# Patient Record
Sex: Female | Born: 1960 | Race: Black or African American | Hispanic: No | Smoking: Current every day smoker
Health system: Southern US, Community
[De-identification: ages and names within clinical notes are randomized; demographics above are authoritative.]

## PROBLEM LIST (undated history)

## (undated) DIAGNOSIS — I10 Essential (primary) hypertension: Secondary | ICD-10-CM

## (undated) DIAGNOSIS — R0683 Snoring: Secondary | ICD-10-CM

## (undated) DIAGNOSIS — K219 Gastro-esophageal reflux disease without esophagitis: Secondary | ICD-10-CM

## (undated) DIAGNOSIS — F339 Major depressive disorder, recurrent, unspecified: Secondary | ICD-10-CM

## (undated) DIAGNOSIS — I780 Hereditary hemorrhagic telangiectasia: Secondary | ICD-10-CM

## (undated) DIAGNOSIS — Z8739 Personal history of other diseases of the musculoskeletal system and connective tissue: Secondary | ICD-10-CM

## (undated) DIAGNOSIS — M1990 Unspecified osteoarthritis, unspecified site: Secondary | ICD-10-CM

## (undated) DIAGNOSIS — E669 Obesity, unspecified: Secondary | ICD-10-CM

## (undated) DIAGNOSIS — E785 Hyperlipidemia, unspecified: Secondary | ICD-10-CM

## (undated) DIAGNOSIS — F419 Anxiety disorder, unspecified: Secondary | ICD-10-CM

## (undated) DIAGNOSIS — J189 Pneumonia, unspecified organism: Secondary | ICD-10-CM

## (undated) HISTORY — DX: Personal history of other diseases of the musculoskeletal system and connective tissue: Z87.39

## (undated) HISTORY — DX: Major depressive disorder, recurrent, unspecified: F33.9

## (undated) HISTORY — PX: DILATION AND CURETTAGE OF UTERUS: SHX78

## (undated) HISTORY — DX: Unspecified osteoarthritis, unspecified site: M19.90

## (undated) HISTORY — PX: ABDOMINAL HYSTERECTOMY: SHX81

## (undated) MED FILL — Diphenhydramine HCl Cap 25 MG: ORAL | Qty: 1 | Status: AC

## (undated) MED FILL — Acetaminophen Tab 325 MG: ORAL | Qty: 2 | Status: AC

## (undated) NOTE — *Deleted (*Deleted)
[facility]   Telephone:([phone] Fax:(336) [phone]   Clinic Follow up Note   Patient Care Team: Claudean Severance, MD as PCP - General (Internal Medicine)  Date of Service:  01/31/2020  CHIEF COMPLAINT:  F/uof HHT andAnemia  PREVIOUS THERAPY:  -Multiple EGD, small bowel enteroscope, C-scope with APC -Oral and iv amicar were given during hospital stay, no effect -IR embolization ofof the left gastric and short gastric artery branch vessels without evidence of residual flow in the Dieulafoy lesionon 12/10/17 -Avastin on 8/2 and 8/30 at [facility]; starting 12/26/17 continue 5mg /kg q2 weeksuntil 02/20/18, restarted on 04/03/2018 -Tamoxifen started in 10/2017 for HHTstopped 12/10/18 due toDVT  CURRENT THERAPY: -Blood transfusion for severe anemia secondary to GI bleedingas needed -ivferric gluconate every1-2 weeks as neededwith ferritin goal 100-200. Increased to weekly on 07/29/19.  -RestartedAvastin q2weeks on 04/03/18. Switched to [place] in 01/2019. Reduced to every 4 weeks starting 09/25/19. Increased to every 2 weeks on 01/06/20 -Amicar 1g bidstarting4/06/2018. Reduced to only as needed on 12/10/18 due toDVT  INTERVAL HISTORY: *** Peggy House is here for a follow up of anemia and HHT. She presents to the clinic alone.    REVIEW OF SYSTEMS:  *** Constitutional: Denies fevers, chills or abnormal weight loss Eyes: Denies blurriness of vision Ears, nose, mouth, throat, and face: Denies mucositis or sore throat Respiratory: Denies cough, dyspnea or wheezes Cardiovascular: Denies palpitation, chest discomfort or lower extremity swelling Gastrointestinal:  Denies nausea, heartburn or change in bowel habits Skin: Denies abnormal skin rashes Lymphatics: Denies new lymphadenopathy or easy bruising Neurological:Denies numbness, tingling or new weaknesses Behavioral/Psych: Mood is stable, no new changes  All other systems were reviewed with the  patient and are negative.  MEDICAL HISTORY:  Past Medical History:  Diagnosis Date  . Anxiety   . Arthritis    knnes,back  . GERD (gastroesophageal reflux disease)   . Hereditary hemorrhagic telangiectasia (HCC)   . History of swelling of feet   . Hyperlipidemia   . Hypertension   . Major depressive disorder, recurrent episode (HCC) 06/05/2015  . Obesity   . Snores   . 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 ABDOMINAL HYSTERECTOMY    . CARPAL TUNNEL RELEASE  05/13/2011   Procedure: CARPAL TUNNEL RELEASE;  Surgeon: Mable Paris, MD;  Location: [facility];  Service: Orthopedics;  Laterality: Left;  . COLONOSCOPY WITH PROPOFOL N/A 04/28/2014   Procedure: COLONOSCOPY WITH PROPOFOL;  Surgeon: Florencia Reasons, MD;  Location: [facility] ENDOSCOPY;  Service: Endoscopy;  Laterality: N/A;  . DG TOES*L*  2/10   rt  . DILATION AND CURETTAGE OF UTERUS    . ENTEROSCOPY N/A 10/17/2017   Procedure: ENTEROSCOPY;  Surgeon: Kathi Der, MD;  Location: [facility] ENDOSCOPY;  Service: Gastroenterology;  Laterality: N/A;  . ESOPHAGOGASTRODUODENOSCOPY N/A 04/10/2014   Procedure: ESOPHAGOGASTRODUODENOSCOPY (EGD);  Surgeon: Shirley Friar, MD;  Location: [facility] ENDOSCOPY;  Service: Endoscopy;  Laterality: N/A;  . ESOPHAGOGASTRODUODENOSCOPY N/A 05/10/2017   Procedure: ESOPHAGOGASTRODUODENOSCOPY (EGD);  Surgeon: Bernette Redbird, MD;  Location: [facility] ENDOSCOPY;  Service: Endoscopy;  Laterality: N/A;  . ESOPHAGOGASTRODUODENOSCOPY N/A 09/22/2017   Procedure: ESOPHAGOGASTRODUODENOSCOPY (EGD);  Surgeon: Vida Rigger, MD;  Location: [facility] ENDOSCOPY;  Service: Endoscopy;  Laterality: N/A;  bedside  . ESOPHAGOGASTRODUODENOSCOPY (EGD) WITH PROPOFOL N/A 04/27/2014   Procedure: ESOPHAGOGASTRODUODENOSCOPY (EGD) WITH PROPOFOL;  Surgeon: Florencia Reasons, MD;  Location[facility]OSCOPY;  Service:  Endoscopy;  Laterality:  N/A;  possible apc  . ESOPHAGOGASTRODUODENOSCOPY (EGD) WITH PROPOFOL N/A 09/30/2017   Procedure: ESOPHAGOGASTRODUODENOSCOPY (EGD) WITH PROPOFOL;  Surgeon: Kerin Salen, MD;  Location: [facility] ENDOSCOPY;  Service: Gastroenterology;  Laterality: N/A;  . ESOPHAGOGASTRODUODENOSCOPY (EGD) WITH PROPOFOL N/A 10/01/2017   Procedure: ESOPHAGOGASTRODUODENOSCOPY (EGD) WITH PROPOFOL;  Surgeon: Kerin Salen, MD;  Location: [facility] ENDOSCOPY;  Service: Gastroenterology;  Laterality: N/A;  . ESOPHAGOGASTRODUODENOSCOPY (EGD) WITH PROPOFOL N/A 10/08/2017   Procedure: ESOPHAGOGASTRODUODENOSCOPY (EGD) WITH PROPOFOL;  Surgeon: Kathi Der, MD;  Location: [facility] ENDOSCOPY;  Service: Gastroenterology;  Laterality: N/A;  . ESOPHAGOGASTRODUODENOSCOPY (EGD) WITH PROPOFOL N/A 10/17/2017   Procedure: ESOPHAGOGASTRODUODENOSCOPY (EGD) WITH PROPOFOL;  Surgeon: Kathi Der, MD;  Location: [facility] ENDOSCOPY;  Service: Gastroenterology;  Laterality: N/A;  . ESOPHAGOGASTRODUODENOSCOPY (EGD) WITH PROPOFOL N/A 10/19/2017   Procedure: ESOPHAGOGASTRODUODENOSCOPY (EGD) WITH PROPOFOL;  Surgeon: Kathi Der, MD;  Location: [facility] ENDOSCOPY;  Service: Gastroenterology;  Laterality: N/A;  . ESOPHAGOGASTRODUODENOSCOPY (EGD) WITH PROPOFOL N/A 12/04/2018   Procedure: ESOPHAGOGASTRODUODENOSCOPY (EGD) WITH PROPOFOL;  Surgeon: Charlott Rakes, MD;  Location: [facility] ENDOSCOPY;  Service: Endoscopy;  Laterality: N/A;  . GIVENS CAPSULE STUDY N/A 10/02/2017   Procedure: GIVENS CAPSULE STUDY;  Surgeon: Kerin Salen, MD;  Location: [facility] ENDOSCOPY;  Service: Gastroenterology;  Laterality: N/A;  . GIVENS CAPSULE STUDY N/A 10/08/2017   Procedure: GIVENS CAPSULE STUDY;  Surgeon: Kathi Der, MD;  Location: [facility] ENDOSCOPY;  Service: Gastroenterology;  Laterality: N/A;  endoscopic placement of capsule  . HEMOSTASIS CLIP PLACEMENT  12/04/2018   Procedure: HEMOSTASIS CLIP PLACEMENT;  Surgeon: Charlott Rakes, MD;  Location: [facility] ENDOSCOPY;  Service: Endoscopy;;  . HOT HEMOSTASIS  N/A 04/27/2014   Procedure: HOT HEMOSTASIS (ARGON PLASMA COAGULATION/BICAP);  Surgeon: Florencia Reasons, MD;  Location: [facility] ENDOSCOPY;  Service: Endoscopy;  Laterality: N/A;  . HOT HEMOSTASIS N/A 09/30/2017   Procedure: HOT HEMOSTASIS (ARGON PLASMA COAGULATION/BICAP);  Surgeon: Kerin Salen, MD;  Location: [facility] ENDOSCOPY;  Service: Gastroenterology;  Laterality: N/A;  . HOT HEMOSTASIS N/A 10/01/2017   Procedure: HOT HEMOSTASIS (ARGON PLASMA COAGULATION/BICAP);  Surgeon: Kerin Salen, MD;  Location: [facility] ENDOSCOPY;  Service: Gastroenterology;  Laterality: N/A;  . HOT HEMOSTASIS N/A 10/17/2017   Procedure: HOT HEMOSTASIS (ARGON PLASMA COAGULATION/BICAP);  Surgeon: Kathi Der, MD;  Location: [facility] ENDOSCOPY;  Service: Gastroenterology;  Laterality: N/A;  . HOT HEMOSTASIS N/A 10/19/2017   Procedure: HOT HEMOSTASIS (ARGON PLASMA COAGULATION/BICAP);  Surgeon: Kathi Der, MD;  Location: [facility] ENDOSCOPY;  Service: Gastroenterology;  Laterality: N/A;  . IR IMAGING GUIDED PORT INSERTION  07/08/2018  . L shoulder Surgery  2011  . SUBMUCOSAL INJECTION  09/22/2017   Procedure: SUBMUCOSAL INJECTION;  Surgeon: Vida Rigger, MD;  Location: [facility] ENDOSCOPY;  Service: Endoscopy;;  . SUBMUCOSAL INJECTION  12/04/2018   Procedure: SUBMUCOSAL INJECTION;  Surgeon: Charlott Rakes, MD;  Location: [facility] ENDOSCOPY;  Service: Endoscopy;;    I have reviewed the social history and family history with the patient and they are unchanged from previous note.  ALLERGIES:  is allergic to feraheme [ferumoxytol], nsaids, tomato, and wasp venom.  MEDICATIONS:  Current Outpatient Medications  Medication Sig Dispense Refill  . Accu-Chek Softclix Lancets lancets Use to check blood sugar before breakfast and before dinner while on steroids 100 each 1  . acitretin (SORIATANE) 10 MG capsule Take 10 mg by mouth daily.    [name] albuterol (PROVENTIL HFA;VENTOLIN HFA) 108 (90 Base) MCG/ACT inhaler Inhale 1-2 puffs into the lungs every 6 (six) hours as  needed for wheezing or shortness of breath. 1 Inhaler 3  .  ALPRAZolam (XANAX) 0.5 MG tablet Take 1 tablet (0.5 mg total) by mouth at bedtime as needed for anxiety. 30 tablet 0  . Aminocaproic Acid 1000 MG TABS TAKE 1 TABLET(1000 MG) BY MOUTH TWICE DAILY 60 tablet 1  . amLODipine (NORVASC) 10 MG tablet TAKE 1 TABLET(10 MG) BY MOUTH DAILY 90 tablet 0  . atorvastatin (LIPITOR) 80 MG tablet TAKE 1 TABLET(80 MG) BY MOUTH DAILY 30 tablet 3  . cetirizine (ZYRTEC) 10 MG tablet TAKE 1 TABLET(10 MG) BY MOUTH DAILY (Patient taking differently: Take 10 mg by mouth daily. ) 90 tablet 1  . cyclobenzaprine (FLEXERIL) 10 MG tablet Take 1 tablet (10 mg total) by mouth 2 (two) times daily as needed for up to 20 doses for muscle spasms. 20 tablet 0  . diphenhydramine-acetaminophen (TYLENOL PM) 25-500 MG TABS tablet Take 1 tablet by mouth at bedtime as needed (sleep).    . diphenoxylate-atropine (LOMOTIL) 2.5-0.025 MG tablet 1 to 2 PO QID prn diarrhea 30 tablet 1  . glucose blood (ACCU-CHEK GUIDE) test strip Check blood sugar 2 times per day while on steroids before breakfast and dinner 50 each 3  . hydrochlorothiazide (HYDRODIURIL) 12.5 MG tablet TAKE 1 TABLET(12.5 MG) BY MOUTH DAILY 90 tablet 1  . lidocaine-prilocaine (EMLA) cream Apply 1 application topically as needed. 30 g 0  . meloxicam (MOBIC) 15 MG tablet Take 15 mg by mouth 3 (three) times daily.    . metFORMIN (GLUCOPHAGE) 500 MG tablet Take 1 tablet (500 mg total) by mouth 2 (two) times daily with a meal. 180 tablet 3  . pantoprazole (PROTONIX) 40 MG tablet Take 1 tablet (40 mg total) by mouth 2 (two) times daily. 60 tablet 5  . Podiatric Products (FLEXITOL HEEL BALM) OINT Apply topically.    . potassium chloride (KLOR-CON) 20 MEQ packet Take 20 mEq by mouth 2 (two) times daily. 30 packet 1  . prochlorperazine (COMPAZINE) 10 MG tablet Take 1 tablet (10 mg total) by mouth every 6 (six) hours as needed for nausea or vomiting. 30 tablet 1  . sertraline  (ZOLOFT) 100 MG tablet TAKE 1 TABLET(100 MG) BY MOUTH DAILY (Patient taking differently: Take 100 mg by mouth daily. ) 90 tablet 1  . traZODone (DESYREL) 50 MG tablet TAKE 1 TABLET(50 MG) BY MOUTH AT BEDTIME (Patient taking differently: Take 50 mg by mouth at bedtime. ) 30 tablet 2   No current facility-administered medications for this visit.    PHYSICAL EXAMINATION: ECOG PERFORMANCE STATUS: {CHL ONC ECOG [phone]}  There were no vitals filed for this visit. There were no vitals filed for this visit. *** GENERAL:alert, no distress and comfortable SKIN: skin color, texture, turgor are normal, no rashes or significant lesions EYES: normal, Conjunctiva are pink and non-injected, sclera clear {OROPHARYNX:no exudate, no erythema and lips, buccal mucosa, and tongue normal}  NECK: supple, thyroid normal size, non-tender, without nodularity LYMPH:  no palpable lymphadenopathy in the cervical, axillary {or inguinal} LUNGS: clear to auscultation and percussion with normal breathing effort HEART: regular rate & rhythm and no murmurs and no lower extremity edema ABDOMEN:abdomen soft, non-tender and normal bowel sounds Musculoskeletal:no cyanosis of digits and no clubbing  NEURO: alert & oriented x 3 with fluent speech, no focal motor/sensory deficits  LABORATORY DATA:  I have reviewed the data as listed CBC Latest Ref Rng & Units 01/20/2020 01/06/2020 11/26/2019  WBC 4.0 - 10.5 K/uL 12.7(H) 13.5(H) 11.9(H)  Hemoglobin 12.0 - 15.0 g/dL 4.0(J) 7.2(L) 9.0(L)  Hematocrit 36 - 46 %  28.9(L) 24.4(L) 30.7(L)  Platelets 150 - 400 K/uL 309 301 284     CMP Latest Ref Rng & Units 01/20/2020 01/06/2020 11/26/2019  Glucose 70 - 99 mg/dL 161(W) 960(A) 540(J)  BUN 6 - 20 mg/dL 6 10 6   Creatinine 0.44 - 1.00 mg/dL 8.11 9.14 7.82  Sodium 135 - 145 mmol/L 142 141 143  Potassium 3.5 - 5.1 mmol/L 3.0(LL) 3.0(LL) 3.5  Chloride 98 - 111 mmol/L 105 104 108  CO2 22 - 32 mmol/L 30 28 23   Calcium 8.9 - 10.3 mg/dL  9.2 9.3 9.7  Total Protein 6.5 - 8.1 g/dL 7.2 7.1 7.5  Total Bilirubin 0.3 - 1.2 mg/dL 0.3 9.5(A) 0.3  Alkaline Phos 38 - 126 U/L 91 83 83  AST 15 - 41 U/L 11(L) 10(L) 18  ALT 0 - 44 U/L 7 7 10       RADIOGRAPHIC STUDIES: I have personally reviewed the radiological images as listed and agreed with the findings in the report. No results found.   ASSESSMENT & PLAN:  Peggy House is a 66 y.o. female with    1. 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 Extensive workup found the pt to have HHT based on her personal and familyhistoryand recurrentAVM GI bleedings. -Sheis s/pIR embolization of the left Gastric and short gastric artery branchvesselswithout evidence of residual flow in the Dieulafoy lesionon 12/10/2017. -She is currently being treated with Amicar with severe bleeding and Avastin every 2-4 weeks (based on proteinuria) and IV Ferric Gluconate every 1-2 weeks (unless Ferritin >200)and blood transfusions as needed.We repeatedly discussed consistency in treatments to manage her blood counts, she voices good understanding.  ***  -She is clinically stable lately. Labs reviewed, WBC 11, Hg 10, ANC 8.7. Will proceed with Ferric Gluconate today. Given she has not had recent GI bleeding, will hold Avastin today.   -Given she is stable will reduce Ferric Gluconate unless Ferritin >200 to every 2 weeks. Will reduce Avastin to every 4 weeks due to her proteinuria and she has not been GI bleeding lately.  -F/u in 8 weeks    2.Anemia of recurrent GI bleeding and ironDeficiency -Secondary to chronic epistaxis and GI Bleeding from AVM  -EGDfrom 8/21/20showeda dieulafoy lesion which was clipped and injected, large amount blood found -We will follow her with weekly labs and IV Ferric  Gluconateevery 1-2 weeks with ferritin goal 100-200. She is also on blood transfusionsas needed.Last Blood transfusion was 01/18/20. Continue prenatal vitamin.  -Although she has not had recent GI bleeding she still has occasional moderate epistaxis.   3. Reactive Leukocytosis  -She has mild leukocytosis with dominant neutrophils, likely reactive. Her prior thrombocytosis resolved.  -Mostly stable.   4. 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 She required cauterization for stomach for severe Gi bleeding in 11/2018 -Continue to follow up with her GI, Dr. Matthias Hughs -I started heron Amicar 07/17/18. Given DVT she will only take Amicar for severe epistaxis -Her GI bleeding has resolved currently.But has been havingoccasional mildepistaxis.   5.History of right Arm DVT -She had right arm DVT that extended to her right clavicleon 12/07/2018.  -She is not a candidate for anticoagulation due to severe GI bleeding. We stopped Tamoxifen, she will only use Amicar for severe epistaxis. Will maintain Port flushes.  6.  Smoking cessation -She was smoking 10 cigarettes a day on average before recent hospitalization -I strongly encouraged her to stop smoking completely, she will try.  7. Hypokalemia -Onpotassium chloride 20 mEq daily, continue. Willmonitor  9. HTN, uncontrolled, Recently diagnosed DM -Continue medications. Will monitor on Avastin.  -Continue to f/u with PCP   10. Anxiety  -She has been stressed with family health issues (her husband's passing summer 2021 and son's DM on transplant list). She has been more anxious and started having panic attacks -She is currently on Xanax at 0.5mg  dose to use only as needed. F/u with SW as needed for counseling.   11. B/l LE Edema  -For the past 3 weeks she has had b/l LE edema, L>R with tenderness and pain. She has skin darkening of her  ankles to lower calf.  -She was recently seen by PCP who considered this to be related to gout, per pt.  -I recommend Doppler of Left LE to evaluate for blood clot. She did not proceed. -She will continue to elevate her feet and use compression socks    PLAN: ***    No problem-specific Assessment & Plan notes found for this encounter.   No orders of the defined types were placed in this encounter.  All questions were answered. The patient knows to call the clinic with any problems, questions or concerns. No barriers to learning was detected. The total time spent in the appointment was {CHL ONC TIME VISIT - ZOXWR:[id number]}.     Delphina Cahill 01/31/2020   Rogelia Rohrer, am acting as scribe for Malachy Mood, MD.   {Add scribe attestation statement}

---

## 1997-12-11 ENCOUNTER — Emergency Department (HOSPITAL_COMMUNITY): Admission: EM | Admit: 1997-12-11 | Discharge: 1997-12-11 | Payer: Self-pay | Admitting: Emergency Medicine

## 2000-02-03 ENCOUNTER — Emergency Department (HOSPITAL_COMMUNITY): Admission: EM | Admit: 2000-02-03 | Discharge: 2000-02-03 | Payer: Self-pay

## 2000-03-21 ENCOUNTER — Emergency Department (HOSPITAL_COMMUNITY): Admission: EM | Admit: 2000-03-21 | Discharge: 2000-03-21 | Payer: Self-pay | Admitting: Internal Medicine

## 2000-03-21 ENCOUNTER — Encounter: Payer: Self-pay | Admitting: Internal Medicine

## 2007-08-01 ENCOUNTER — Inpatient Hospital Stay (HOSPITAL_COMMUNITY): Admission: EM | Admit: 2007-08-01 | Discharge: 2007-08-07 | Payer: Self-pay | Admitting: Emergency Medicine

## 2008-02-01 ENCOUNTER — Inpatient Hospital Stay (HOSPITAL_COMMUNITY): Admission: AD | Admit: 2008-02-01 | Discharge: 2008-02-03 | Payer: Self-pay | Admitting: Obstetrics & Gynecology

## 2008-02-02 ENCOUNTER — Encounter (INDEPENDENT_AMBULATORY_CARE_PROVIDER_SITE_OTHER): Payer: Self-pay | Admitting: Obstetrics

## 2008-03-16 ENCOUNTER — Encounter (INDEPENDENT_AMBULATORY_CARE_PROVIDER_SITE_OTHER): Payer: Self-pay | Admitting: Obstetrics

## 2008-03-16 ENCOUNTER — Inpatient Hospital Stay (HOSPITAL_COMMUNITY): Admission: RE | Admit: 2008-03-16 | Discharge: 2008-03-18 | Payer: Self-pay | Admitting: Obstetrics

## 2008-05-16 HISTORY — PX: DG TOES*L*: HXRAD270

## 2008-09-20 ENCOUNTER — Encounter: Admission: RE | Admit: 2008-09-20 | Discharge: 2008-09-20 | Payer: Self-pay | Admitting: Family Medicine

## 2008-09-22 ENCOUNTER — Emergency Department (HOSPITAL_COMMUNITY): Admission: EM | Admit: 2008-09-22 | Discharge: 2008-09-23 | Payer: Self-pay | Admitting: Emergency Medicine

## 2008-09-28 ENCOUNTER — Ambulatory Visit (HOSPITAL_COMMUNITY): Admission: RE | Admit: 2008-09-28 | Discharge: 2008-09-28 | Payer: Self-pay | Admitting: Family Medicine

## 2008-09-29 ENCOUNTER — Inpatient Hospital Stay (HOSPITAL_COMMUNITY): Admission: EM | Admit: 2008-09-29 | Discharge: 2008-10-01 | Payer: Self-pay | Admitting: Emergency Medicine

## 2008-10-12 ENCOUNTER — Encounter: Admission: RE | Admit: 2008-10-12 | Discharge: 2008-10-12 | Payer: Self-pay | Admitting: Family Medicine

## 2008-10-31 ENCOUNTER — Encounter: Admission: RE | Admit: 2008-10-31 | Discharge: 2008-10-31 | Payer: Self-pay | Admitting: Family Medicine

## 2008-11-08 ENCOUNTER — Encounter: Admission: RE | Admit: 2008-11-08 | Discharge: 2008-11-08 | Payer: Self-pay | Admitting: Family Medicine

## 2008-11-23 ENCOUNTER — Encounter (INDEPENDENT_AMBULATORY_CARE_PROVIDER_SITE_OTHER): Payer: Self-pay | Admitting: *Deleted

## 2008-11-23 ENCOUNTER — Inpatient Hospital Stay (HOSPITAL_COMMUNITY): Admission: EM | Admit: 2008-11-23 | Discharge: 2008-11-25 | Payer: Self-pay | Admitting: Emergency Medicine

## 2008-11-24 ENCOUNTER — Telehealth (INDEPENDENT_AMBULATORY_CARE_PROVIDER_SITE_OTHER): Payer: Self-pay | Admitting: *Deleted

## 2008-11-24 ENCOUNTER — Encounter (INDEPENDENT_AMBULATORY_CARE_PROVIDER_SITE_OTHER): Payer: Self-pay | Admitting: *Deleted

## 2008-11-25 ENCOUNTER — Encounter (INDEPENDENT_AMBULATORY_CARE_PROVIDER_SITE_OTHER): Payer: Self-pay | Admitting: *Deleted

## 2008-11-30 ENCOUNTER — Ambulatory Visit: Payer: Self-pay | Admitting: Gastroenterology

## 2008-12-05 ENCOUNTER — Encounter: Admission: RE | Admit: 2008-12-05 | Discharge: 2008-12-05 | Payer: Self-pay | Admitting: Family Medicine

## 2009-01-05 ENCOUNTER — Encounter: Admission: RE | Admit: 2009-01-05 | Discharge: 2009-01-05 | Payer: Self-pay | Admitting: Family Medicine

## 2009-04-15 HISTORY — PX: OTHER SURGICAL HISTORY: SHX169

## 2009-05-04 ENCOUNTER — Encounter: Admission: RE | Admit: 2009-05-04 | Discharge: 2009-05-04 | Payer: Self-pay | Admitting: Family Medicine

## 2009-07-28 ENCOUNTER — Ambulatory Visit (HOSPITAL_COMMUNITY): Admission: RE | Admit: 2009-07-28 | Discharge: 2009-07-28 | Payer: Self-pay | Admitting: Family Medicine

## 2009-08-16 ENCOUNTER — Encounter: Admission: RE | Admit: 2009-08-16 | Discharge: 2009-08-16 | Payer: Self-pay | Admitting: Family Medicine

## 2009-09-06 ENCOUNTER — Encounter: Admission: RE | Admit: 2009-09-06 | Discharge: 2009-10-11 | Payer: Self-pay | Admitting: Orthopedic Surgery

## 2009-10-26 ENCOUNTER — Emergency Department (HOSPITAL_COMMUNITY): Admission: EM | Admit: 2009-10-26 | Discharge: 2009-10-26 | Payer: Self-pay | Admitting: Emergency Medicine

## 2009-11-22 ENCOUNTER — Emergency Department (HOSPITAL_COMMUNITY): Admission: EM | Admit: 2009-11-22 | Discharge: 2009-11-22 | Payer: Self-pay | Admitting: Emergency Medicine

## 2010-05-06 ENCOUNTER — Encounter: Payer: Self-pay | Admitting: Family Medicine

## 2010-05-06 ENCOUNTER — Encounter: Payer: Self-pay | Admitting: Emergency Medicine

## 2010-05-07 ENCOUNTER — Encounter: Payer: Self-pay | Admitting: Family Medicine

## 2010-07-03 LAB — LIPID PANEL
Cholesterol: 284 mg/dL — ABNORMAL HIGH (ref 0–200)
HDL: 32 mg/dL — ABNORMAL LOW (ref >39)
LDL Cholesterol: 219 mg/dL — ABNORMAL HIGH (ref 0–99)
Total CHOL/HDL Ratio: 8.9 RATIO

## 2010-07-03 LAB — CBC
HCT: 34.9 % — ABNORMAL LOW (ref 36.0–46.0)
Hemoglobin: 11.3 g/dL — ABNORMAL LOW (ref 12.0–15.0)
MCHC: 32.3 g/dL (ref 30.0–36.0)
MCV: 80.1 fL (ref 78.0–100.0)
Platelets: 272 10*3/uL (ref 150–400)
RDW: 17.8 % — ABNORMAL HIGH (ref 11.5–15.5)

## 2010-07-03 LAB — COMPREHENSIVE METABOLIC PANEL
Albumin: 3.5 g/dL (ref 3.5–5.2)
Alkaline Phosphatase: 60 U/L (ref 39–117)
BUN: 9 mg/dL (ref 6–23)
Calcium: 8.9 mg/dL (ref 8.4–10.5)
Creatinine, Ser: 0.94 mg/dL (ref 0.4–1.2)
Glucose, Bld: 107 mg/dL — ABNORMAL HIGH (ref 70–99)
Total Protein: 7.4 g/dL (ref 6.0–8.3)

## 2010-07-03 LAB — TSH: TSH: 4.578 u[IU]/mL — ABNORMAL HIGH (ref 0.350–4.500)

## 2010-07-21 LAB — COMPREHENSIVE METABOLIC PANEL
CO2: 27 mEq/L (ref 19–32)
Calcium: 9.2 mg/dL (ref 8.4–10.5)
Creatinine, Ser: 1.28 mg/dL — ABNORMAL HIGH (ref 0.4–1.2)
GFR calc Af Amer: 54 mL/min — ABNORMAL LOW (ref >60)
GFR calc non Af Amer: 45 mL/min — ABNORMAL LOW (ref >60)
Glucose, Bld: 118 mg/dL — ABNORMAL HIGH (ref 70–99)
Sodium: 138 mEq/L (ref 135–145)
Total Protein: 7.6 g/dL (ref 6.0–8.3)

## 2010-07-21 LAB — CLOSTRIDIUM DIFFICILE EIA

## 2010-07-21 LAB — BASIC METABOLIC PANEL
BUN: 4 mg/dL — ABNORMAL LOW (ref 6–23)
Chloride: 106 mEq/L (ref 96–112)
Creatinine, Ser: 0.97 mg/dL (ref 0.4–1.2)
Creatinine, Ser: 1.02 mg/dL (ref 0.4–1.2)
GFR calc Af Amer: >60 mL/min (ref >60)
GFR calc Af Amer: >60 mL/min (ref >60)
GFR calc non Af Amer: >60 mL/min (ref >60)
Potassium: 3.2 mEq/L — ABNORMAL LOW (ref 3.5–5.1)
Potassium: 3.8 mEq/L (ref 3.5–5.1)
Sodium: 138 mEq/L (ref 135–145)

## 2010-07-21 LAB — CBC
MCHC: 32.3 g/dL (ref 30.0–36.0)
MCV: 82 fL (ref 78.0–100.0)
Platelets: 322 10*3/uL (ref 150–400)
RBC: 4.2 MIL/uL (ref 3.87–5.11)
RDW: 20.3 % — ABNORMAL HIGH (ref 11.5–15.5)
WBC: 10.7 10*3/uL — ABNORMAL HIGH (ref 4.0–10.5)

## 2010-07-21 LAB — GIARDIA/CRYPTOSPORIDIUM SCREEN(EIA): Cryptosporidium Screen (EIA): NEGATIVE

## 2010-07-21 LAB — FECAL LACTOFERRIN, QUANT: Fecal Lactoferrin: POSITIVE

## 2010-07-21 LAB — MAGNESIUM: Magnesium: 2.3 mg/dL (ref 1.5–2.5)

## 2010-07-21 LAB — DIFFERENTIAL
Lymphocytes Relative: 13 % (ref 12–46)
Lymphocytes Relative: 16 % (ref 12–46)
Lymphs Abs: 1.7 10*3/uL (ref 0.7–4.0)
Lymphs Abs: 2 10*3/uL (ref 0.7–4.0)
Monocytes Relative: 3 % (ref 3–12)
Neutrophils Relative %: 78 % — ABNORMAL HIGH (ref 43–77)
Neutrophils Relative %: 82 % — ABNORMAL HIGH (ref 43–77)

## 2010-07-21 LAB — IRON AND TIBC
Iron: <10 ug/dL — ABNORMAL LOW (ref 42–135)
UIBC: 241 ug/dL

## 2010-07-21 LAB — FERRITIN: Ferritin: 48 ng/mL (ref 10–291)

## 2010-07-21 LAB — URINALYSIS, ROUTINE W REFLEX MICROSCOPIC
Nitrite: NEGATIVE
Specific Gravity, Urine: 1.012 (ref 1.005–1.030)
Urobilinogen, UA: 0.2 mg/dL (ref 0.0–1.0)
pH: 6 (ref 5.0–8.0)

## 2010-07-21 LAB — STOOL CULTURE

## 2010-07-21 LAB — LACTIC ACID, PLASMA: Lactic Acid, Venous: 1.6 mmol/L (ref 0.5–2.2)

## 2010-07-21 LAB — URINE CULTURE

## 2010-07-21 LAB — LIPASE, BLOOD: Lipase: 15 U/L (ref 11–59)

## 2010-07-21 LAB — TSH: TSH: 1.477 u[IU]/mL (ref 0.350–4.500)

## 2010-07-23 LAB — DIFFERENTIAL
Basophils Absolute: 0.2 10*3/uL — ABNORMAL HIGH (ref 0.0–0.1)
Basophils Relative: 0 % (ref 0–1)
Basophils Relative: 1 % (ref 0–1)
Eosinophils Absolute: 0 10*3/uL (ref 0.0–0.7)
Eosinophils Absolute: 0 10*3/uL (ref 0.0–0.7)
Lymphocytes Relative: 15 % (ref 12–46)
Lymphs Abs: 2.5 10*3/uL (ref 0.7–4.0)
Monocytes Absolute: 0.1 10*3/uL (ref 0.1–1.0)
Monocytes Absolute: 0.7 10*3/uL (ref 0.1–1.0)
Monocytes Relative: 1 % — ABNORMAL LOW (ref 3–12)
Monocytes Relative: 3 % (ref 3–12)
Monocytes Relative: 4 % (ref 3–12)
Neutro Abs: 13.2 10*3/uL — ABNORMAL HIGH (ref 1.7–7.7)
Neutro Abs: 14.8 10*3/uL — ABNORMAL HIGH (ref 1.7–7.7)
Neutrophils Relative %: 80 % — ABNORMAL HIGH (ref 43–77)
Neutrophils Relative %: 83 % — ABNORMAL HIGH (ref 43–77)
Neutrophils Relative %: 92 % — ABNORMAL HIGH (ref 43–77)

## 2010-07-23 LAB — CULTURE, BLOOD (ROUTINE X 2): Culture: NO GROWTH

## 2010-07-23 LAB — URINE CULTURE: Colony Count: 60000

## 2010-07-23 LAB — URINALYSIS, ROUTINE W REFLEX MICROSCOPIC
Ketones, ur: NEGATIVE mg/dL
Nitrite: NEGATIVE
Specific Gravity, Urine: 1.012 (ref 1.005–1.030)
pH: 7 (ref 5.0–8.0)

## 2010-07-23 LAB — CBC
Hemoglobin: 10.9 g/dL — ABNORMAL LOW (ref 12.0–15.0)
Hemoglobin: 11.1 g/dL — ABNORMAL LOW (ref 12.0–15.0)
MCHC: 32 g/dL (ref 30.0–36.0)
MCHC: 32.2 g/dL (ref 30.0–36.0)
MCHC: 32.4 g/dL (ref 30.0–36.0)
MCHC: 33.3 g/dL (ref 30.0–36.0)
MCV: 81.6 fL (ref 78.0–100.0)
MCV: 81.9 fL (ref 78.0–100.0)
Platelets: 367 10*3/uL (ref 150–400)
RBC: 4.05 MIL/uL (ref 3.87–5.11)
RBC: 4.34 MIL/uL (ref 3.87–5.11)
RBC: 4.56 MIL/uL (ref 3.87–5.11)
RDW: 19.2 % — ABNORMAL HIGH (ref 11.5–15.5)
WBC: 16.5 10*3/uL — ABNORMAL HIGH (ref 4.0–10.5)

## 2010-07-23 LAB — BASIC METABOLIC PANEL
CO2: 27 mEq/L (ref 19–32)
Calcium: 9.4 mg/dL (ref 8.4–10.5)
Calcium: 9.6 mg/dL (ref 8.4–10.5)
Chloride: 107 mEq/L (ref 96–112)
Creatinine, Ser: 0.93 mg/dL (ref 0.4–1.2)
Creatinine, Ser: 1.01 mg/dL (ref 0.4–1.2)
GFR calc Af Amer: >60 mL/min (ref >60)
Glucose, Bld: 132 mg/dL — ABNORMAL HIGH (ref 70–99)

## 2010-07-23 LAB — POCT I-STAT, CHEM 8
BUN: 8 mg/dL (ref 6–23)
Calcium, Ion: 1.2 mmol/L (ref 1.12–1.32)
Creatinine, Ser: 0.9 mg/dL (ref 0.4–1.2)
Glucose, Bld: 165 mg/dL — ABNORMAL HIGH (ref 70–99)
TCO2: 22 mmol/L (ref 0–100)

## 2010-07-23 LAB — AMYLASE: Amylase: 70 U/L (ref 27–131)

## 2010-07-23 LAB — EXPECTORATED SPUTUM ASSESSMENT W GRAM STAIN, RFLX TO RESP C

## 2010-07-23 LAB — BRAIN NATRIURETIC PEPTIDE: Pro B Natriuretic peptide (BNP): <30 pg/mL (ref 0.0–100.0)

## 2010-08-28 NOTE — H&P (Signed)
Peggy House, Peggy House                ACCOUNT NO.:  [id number]   MEDICAL RECORD NO.:  [id number]          PATIENT TYPE:  INP   LOCATION:  5505                         FACILITY:  [facility]   PHYSICIAN:  Jude Ojie, MD          DATE OF BIRTH:  [birth year]   DATE OF ADMISSION:  09/28/2008  DATE OF DISCHARGE:                              HISTORY & PHYSICAL   CHIEF COMPLAINT:  Shortness of breath and cough productive of sputum.   HISTORY OF PRESENTING COMPLAINT:  The patient is a 50 year old with  history of hypertension and iron deficiency anemia who presented to the  ED with complaints of cough productive of a greenish sputum and  associated mild shortness of breath.  The patient had been seen by her  primary care physician, who subsequently referred her to the ED to have  a chest x-ray done.  Chest x-ray done showed a left upper lobe  infiltrate and was subsequently sent to the ED for further evaluation.  The patient was sent to the ED for further evaluation.  Other workup  done in the ED include a CBC which was with a white count of 10.6.  Otherwise, other labs essentially are unremarkable.  Of note, the  patient has also been complaining of some bilateral lower extremity  swelling.  The patient had a D-dimer done in the ED which was negative.  BNP was less than 30.  The patient, however, denied any chest pain, but  complained of mild nausea, but no vomiting.  The patient was given a  dose of IV ceftriaxone and azithromycin already in the ED.  Plan is to  admit to the [facility] to continue with IV antibiotics  before discharge home on p.o. medications.  The ED physician had  discussed with the patient who does not feel that she is well enough to  be discharged home on oral antibiotics.  As such, plan is to admit for  IV antibiotic treatment.   PAST MEDICAL HISTORY:  1. Anemia (iron deficiency).  2. Anxiety.  3. Hypertension.  4. History of arthritis.   ALLERGIES:  The  patient has no known drug allergies.   MEDICATIONS:  1. Clonidine 0.1 mg p.o. daily.  2. Xanax 0.5 mg p.o. at bedtime.  3. Hydrochlorothiazide 25 mg p.o. daily.  4. Amlodipine 10 mg p.o. daily.  5. Mobic 7.5 mg p.o. b.i.d.  6. Hydrocodone and acetaminophen 1 tablet p.o. q.4 h. as needed.  7. Ferrous sulfate 27 mg p.o. daily.   FAMILY HISTORY:  The patient has 3 children.  Family history is  significant for cancer and end-stage renal disease with hemodialysis in  her mother,  otherwise unremarkable.   SOCIAL HISTORY:  The patient smokes 1 pack of cigarette per day, started  smoking at the age of 59.  Drinks alcohol only socially.  Does admit to  having occasional cocaine use, last use of cocaine was about a week ago.   REVIEW OF SYSTEMS:  GENERAL:  No fevers, weight loss, loss of appetite.  No night sweats.  RESPIRATORY:  Positive for cough productive of sputum  and mild shortness of breath.  CVS:  No chest pain or palpitation.  GI:  No nausea, vomiting, diarrhea, or constipation.  ENDOCRINE:  No  polyuria, polydipsia, or polyphagia.  GENITOURINARY:  No dysuria,  frequency, or urgency.  HEME:  No easy skin bruising or epistaxis.  PSYCH:  No depression.  MUSCULOSKELETAL:  Positive for joint pain.  NEUROLOGIC:  No headache, seizures, or syncope.   PHYSICAL EXAMINATION:  VITAL SIGNS:  On presentation, blood pressure  147/91, pulse of 76, respiratory rate of 18, temperature 98.2, and O2  sat of 100%.  GENERAL:  A middle-aged [place] American female, not in any acute  distress at this time.  RESPIRATORY:  Some rhonchi in the left upper lobe.  CVS:  Heart sounds 1 and 2.  Regular rate and rhythm.  No murmurs or  gallop.  ABDOMEN:  Full, nontender.  No organomegaly.  Bowel sounds positive in  all quadrants.  EXTREMITIES:  No cyanosis or clubbing, has 1+ pitting pedal edema  bilaterally.  NEUROLOGIC:  The patient is awake, alert, oriented to time, place, and  person.  No focal  findings at this time.   LABORATORY DATA:  On presentation, CBC with a white count of 10.6,  hemoglobin 11.5, hematocrit of 35.5, and platelet of 315.  BMP with  sodium of 139, potassium of 3.8, chloride 107, bicarb 22, BUN 8,  creatinine 0.9, glucose of 165, ionized calcium of 1.20.  Urinalysis was  essentially unremarkable.  BNP less than 30.  D-dimer 0.37.  Chest x-ray  done showed left upper lobe infiltrate/pneumonia.   ASSESSMENT:  The patient is a 50 year old lady who presented to the ED  with complaints of cough and shortness of breath.  1. Left upper lobe pneumonia (community-acquired pneumonia).  2. History of hypertension.  3. History of iron deficiency anemia.  4. History of anxiety.  5. Arthritis.   PLAN:  1. We will admit to inpatient for treatment with IV antibiotics for      community-acquired pneumonia.  We will place on IV ceftriaxone and      azithromycin.  2. We will send sputum and blood for cultures and sensitivities.  3. We will continue her other home medications.  4. Place on a p.r.n. Phenergan for nausea.  5. Prophylaxis.  The patient has been placed on Lovenox.  Case and      plan discussed extensively with the patient.  She voiced      understanding.  All questions were answered.  Plan is to give 1-2      days of IV antibiotics and if clinically improved, discharged home      on oral antibiotics to follow up with her primary care physician.      Jude Enid Baas, MD  Electronically Signed     Jude Enid Baas, MD  Electronically Signed    JO/MEDQ  D:  09/29/2008  T:  09/29/2008  Job:  [id number]
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
## 2010-08-28 NOTE — H&P (Signed)
NAMESCARLETT, PORTLOCK                ACCOUNT NO.:  [id number]   MEDICAL RECORD NO.:  [id number]          PATIENT TYPE:  INP   LOCATION:  9305                          FACILITY:  [facility]   PHYSICIAN:  Roseanna Rainbow, M.D.DATE OF BIRTH:  [birth year]   DATE OF ADMISSION:  02/01/2008  DATE OF DISCHARGE:                              HISTORY & PHYSICAL   ADDENDUM.   PHYSICAL EXAMINATION:  VITAL SIGNS:  O2 sats 99% on room air, blood  pressure 120/60, temperature 98.2, pulse 66.  GENERAL:  Mild distress.  LUNGS:  Clear to auscultation bilaterally.  HEART: Regular rate and rhythm.  ABDOMEN:  There is a mass arising from the pelvis to approximately 4  fingerbreadths below the umbilicus.  There is mild right lower quadrant  tenderness appreciated.  No rebound or guarding.   EKG showed normal sinus rhythm.      Roseanna Rainbow, M.D.     Judee Clara  D:  02/01/2008  T:  02/02/2008  Job:  [id number]

## 2010-08-28 NOTE — H&P (Signed)
Peggy House, DELONEY NO.:  [id number]   MEDICAL RECORD NO.:  [id number]          PATIENT TYPE:  INP   LOCATION:  6709                         FACILITY:  [facility]   PHYSICIAN:  Massie Maroon, MD        DATE OF BIRTH:  17-Dec-1960   DATE OF ADMISSION:  11/22/2008  DATE OF DISCHARGE:                              HISTORY & PHYSICAL   CHIEF COMPLAINT:  Diarrhea.   HISTORY OF PRESENT ILLNESS:  A 50 year old female with a history of recent  pneumonia x2 apparently complains of diarrhea beginning about 3 days  ago.  She had over 20 episodes today.  She complains of some sharp  diffuse abdominal pain.  The patient denied any nausea or vomiting.  She  does have occasional heartburn.  The patient apparently states that the  diarrhea is mostly loose stool, green and liquidy.  She notes some  bright red blood per rectum and this is in the toilet bowl, as well as  when she wipes with toilet paper.  The patient denies any black stool.  The patient has not been using NSAIDs.  She denies any fever, chills,  recent travel, drinking stream or well water.  The patient was evaluated  in the ED.  She was afebrile.  She had a CT scan of the abdomen and  pelvis which showed no evidence of cholecystitis or pancreatitis.  There  was no mention of colitis.  The patient will be admitted for intractable  diarrhea.   PAST MEDICAL HISTORY:  1. Left upper lung pneumonia.  2. Tobacco abuse.  3. Probable COPD.  4. Hypertension.  5. Iron deficiency anemia (the patient denies any history of prior      colonoscopy).  6. Arthritis.  7. Degenerative joint disease, specifically on the left side.  8. CVA.   PAST SURGICAL HISTORY:  1. Hysterectomy.  2. Curettage of myomas.  3. On March 16, 2008, total abdominal hysterectomy and right      salpingo-oophorectomy by Dr. 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  4. C-section x3.   SOCIAL HISTORY:  The  patient smokes one pack per day x20 years.  She  does not drink.  She does not abuse drugs.   FAMILY HISTORY:  Her maternal grandfather had prostate cancer.  Her  mother is alive at age 3 and has ovarian cancer.  She is not sure of  any family history of her father.  Her maternal grandmother died at age  69 from stomach cancer.   PAST MEDICAL HISTORY:  1. Hypertension.  2. Anemia.   ALLERGIES:  NO KNOWN DRUG ALLERGIES.   MEDICATIONS:  1. Alprazolam 0.5 mg p.o. nightly.  2. Amlodipine 10 mg p.o. daily.  3. Vicodin 7.5/500 mg p.o. q.4-6 h., p.r.n. pain.  4. Hydrochlorothiazide 25 mg p.o. daily.  5. Clonidine __________ mg p.o. daily.  6. Meloxicam 7.5 mg p.o. b.i.d.  7. Ferrous sulfate 27 mg p.o. daily.   REVIEW OF SYSTEMS:  Recent left upper lung pneumonia on September 28, 2008.  Review of systems is otherwise negative for all 10 organ systems except  for pertinent positives stated above.   PHYSICAL EXAMINATION:  VITAL SIGNS:  Temperature 98.7, pulse 77,  respiratory rate 17, blood pressure 110/72.  Pulse ox 100% on room air.  HEENT:  Anicteric, EOMI, no nystagmus, pupils 1.5 mm, symmetric, direct,  consensual, near reflexes intact.  Mucous membranes moist.  NECK:  No JVD, no bruit, no thyromegaly, no adenopathy.  HEART:  Regular rate and rhythm.  S1-S2.  No murmurs, gallops or rubs.  LUNGS:  Clear to auscultation bilaterally.  ABDOMEN:  Soft, obese, slightly tender in the right and left lower  quadrants.  No guarding, no rebound.  Positive normoactive bowel sounds.  EXTREMITIES:  No cyanosis, clubbing or edema.  DP pulses 2+ bilaterally.  SKIN:  No rashes.  LYMPH NODES:  No adenopathy.  NEURO:  Nonfocal, cranial nerves II-XII intact, reflexes 2+, symmetric,  diffuse with downgoing toes bilaterally.  Motor strength 5/5 in all four  extremities.  Pinprick intact.   LABORATORY DATA:  Urinalysis negative.  Lactic acid 1.6, lipase 15.  Sodium 138, potassium 3.0, chloride 102,  bicarb 27, BUN 8, creatinine  1.28, AST 24, ALT 11, alk phos 66, total bilirubin 0.3.  WBC 15.3,  hemoglobin 11.1, platelet count 348.   ASSESSMENT/PLAN:  1. Abdominal pain and diarrhea:  The diarrhea is most likely secondary      to Clostridium difficile colitis, though was no mention of colitis      on CT scan.  The patient will be treated with Flagyl 500 mg IV      t.i.d.  The patient be hydrated with normal saline.  Complaints of      bright red blood per rectum which are possibly hemorrhoidal.  The      patient will be made n.p.o. and we will obtain a GI consult in the      a.m. for colonoscopy plus/minus esophagogastroduodenoscopy for iron-      deficiency anemia.  Check stool for fecal leukocytes, Clostridium      difficile, ova and parasites, culture.  We will obtain a GI consult      in a.m.  2. Anemia:  The patient has a history of iron deficiency anemia.  We      will check iron, TIBC, ferritin, B12, folic acid, ESR, LDH,      haptoglobin.  Consider SPEP and UPEP in the future.  3. Hypokalemia.  KCL 10 mEq and 100 of normal saline IV x3.  4. Hypertension:  The patient will continue on home blood pressure      medications.  5. Arthritis:  The patient will continue on meloxicam and Vicodin.  6. Anxiety:  The patient will continue on alprazolam.  7. Deep venous thrombosis prophylaxis:  Sequential compression devices      and TEDs.      Massie Maroon, MD  Electronically Signed     JYK/MEDQ  D:  11/23/2008  T:  11/23/2008  Job:  [id number]   cc:   Clyda Greener, MD

## 2010-08-28 NOTE — Discharge Summary (Signed)
NAMEBROOKE, STEINHILBER                ACCOUNT NO.:  [id number]   MEDICAL RECORD NO.:  [id number]          PATIENT TYPE:  INP   LOCATION:  5505                         FACILITY:  [facility]   PHYSICIAN:  Elliot Cousin, M.D.    DATE OF BIRTH:  30-Jan-1961   DATE OF ADMISSION:  09/28/2008  DATE OF DISCHARGE:  10/01/2008                               DISCHARGE SUMMARY   DISCHARGE DIAGNOSES:  1. Left upper lobe pneumonia.  2. Tobacco abuse.  3. Leukocytosis secondary to recent steroid injection.  4. Hypertension.  5. Iron deficiency anemia.  6. Degenerative joint disease.   DISCHARGE MEDICATIONS:  1. Ceftin 500 mg b.i.d. for 6 more days.  2. Azithromycin 500 mg daily for 6 more days.  3. Clonidine 0.1 mg daily.  4. Xanax 0.5 mg nightly.  5. Hydrochlorothiazide 25 mg daily.  6. Amlodipine 10 mg daily.  7. Mobic 7.5 mg twice daily.  8. Hydrocodone/acetaminophen 1 tablet every 4-6 hours as needed for      pain.  9. Ferrous sulfate 27 mg once daily.   DISCHARGE DISPOSITION:  The patient is being discharged to home in  improved and stable condition.  She was advised to follow up with her  primary care physician Dr. Bruna Potter in 5-7 days and with her orthopedic  surgeon as needed.   CONSULTATIONS:  None.   PROCEDURE PERFORMED:  Chest x-ray on September 28, 2008.  Results revealed  suspect early left upper lobe infiltrate/pneumonia.   HISTORY OF PRESENT ILLNESS:  The patient is a 50 year old woman with a  past medical history significant for hypertension, iron deficiency  anemia, and degenerative joint disease, who presented to the emergency  department on September 28, 2008, with a chief complaint of shortness of  breath and productive cough.  When she was evaluated in the emergency  department, she was noted to be afebrile and hemodynamically stable.  Her white blood cell count was 10.6.  Her chest x-ray revealed findings  consistent with a left upper lobe infiltrate or pneumonia.  The patient  was  admitted for further evaluation and management.   For additional details, please see the dictated history and physical.   HOSPITAL COURSE:  1. LEFT UPPER LOBE PNEUMONIA AND TOBACCO ABUSE.  The patient was      started empirically on Rocephin and azithromycin.  A sputum culture      was ordered.  However, the sputum that the patient provided was not      representative of lower respiratory secretions.  Blood cultures      were ordered as well and they have remained negative thus far.  A      urinalysis was ordered and it was essentially negative.  She was      treated symptomatically with as-needed albuterol nebulizers.  The      patient was witnessed to be smoking in her room.  She was      admonished to stop and was reminded that this is a smoke-free      hospital.  A nicotine patch was placed and the patient was advised  to stop smoking.  The patient remained afebrile during the entire      hospitalization.  Her white blood cell count which had been 10.6 at      the time of the initial assessment, increased to 18.1.  This was a      concern.  I did question the patient about recent steroid      treatment.  The patient denied taking any oral steroids such as      prednisone.  However, she did disclose that she received a shot      in her left shoulder by her orthopedic surgeon Dr. Renae Fickle on the day      of hospital admission.  She called Dr. Ollen Gross office and it was      confirmed that she did receive a Depo steroid injection which      explained why the patient's white blood cell count was 10.6      initially and increased to 18.1 the following day.  Today, her WBC      is 16.5.  The patient is currently oxygenating 100% on room air.      She is asymptomatic.  She will be discharged to home on #6 more      days of antibiotic therapy with Ceftin and azithromycin.  The      patient was encouraged to call the number given to enroll in a      smoking cessation class at [facility].  She voiced      understanding and was receptive.  2. All of the patient's chronic conditions remained stable throughout      the hospitalization.  She did have some mild lower extremity edema,      however, it resolved with the restart of hydrochlorothiazide.      Elliot Cousin, M.D.  Electronically Signed     DF/MEDQ  D:  10/01/2008  T:  10/02/2008  Job:  [id number]   cc:   Clyda Greener, MD

## 2010-08-28 NOTE — Op Note (Signed)
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   PROCEDURE:  Under general anesthesia, the patient in lithotomy position,  perineum and vagina prepped and draped.  Bladder emptied with a straight  catheter.  Uterus was 18-week size, irregular with multiple myomas.  Speculum placed in the vagina, and the endometrial cavity sounded 11 cm.  Cervix dilated #27 Shawnie Pons, and the diagnostic hysteroscope inserted.  The  cavity was filled with multiple myomas.  Hysteroscope removed, and sharp  curettage performed.  Small amount of tissue obtained.  The patient  tolerated the procedure well, taken to recovery room in good condition.           ______________________________  Kathreen Cosier, M.D.     BAM/MEDQ  D:  02/02/2008  T:  02/03/2008  Job:  [id number]
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
## 2010-08-28 NOTE — Op Note (Signed)
Peggy House, Peggy House                ACCOUNT NO.:  [id number]   MEDICAL RECORD NO.:  [id number]          PATIENT TYPE:  INP   LOCATION:  9313                          FACILITY[facility]  [facility]   PHYSICIAN:  Kathreen Cosier, M.D.DATE OF BIRTH:  Jan 29, 1961   DATE OF PROCEDURE:  03/16/2008  DATE OF DISCHARGE:                               OPERATIVE REPORT   PREOPERATIVE DIAGNOSES:  1. Anemia.  2. Myoma uteri.  3. Dysfunctional bleeding.   POSTOPERATIVE DIAGNOSES:  1. Anemia.  2. Myoma uteri.  3. Dysfunctional bleeding.   SURGEON:  Kathreen Cosier, MD.   FIRST ASSISTANT:  Charles A. Clearance Coots, MD.   ANESTHESIA:  General.   PROCEDURE:  The patient was placed on the operating table in supine  position.  After general anesthesia was administered, abdomen was  prepped and draped.  Bladder was emptied with Foley catheter.  Transverse suprapubic incision was made through old scar and carried  down through the rectus fascia.  Fascia was cleaned and incised to the  length of the incision.  Recti muscles were retracted laterally.  Peritoneum was incised longitudinally.  She had a 18-20-week size myomas  and the uterus was liberated from the abdominal cavity.  Right round  ligament was grasped with the Kelly clamp, cut, and suture ligated with  #1 chromic.  Hemostasis was done in a similar fashion on the other side.  The Metzenbaum scissors were used to dissect the bladder off of the  uterus and cervix.  Her right utero-ovarian ligament was grasped with  the Kelly clamp, cut, and suture ligated with #1 chromic.  Procedure  done in a similar fashion on the other side.  Uterine vessels were  skeletonized bilaterally, double clamped with Heaney clamps on the  right, cut and suture ligated with #1 chromic.  Procedures done in a  similar fashion on the other side.  The uterus was then separated from  the cervix at this point because of this bulk.  The right cardinal  ligaments were grasped with a  Kocher clamp, cut, and suture ligated with  #1 chromic.  Procedure was done in a similar fashion on the other side.  This was continued until the cervicovaginal junction was reached.  When  the cervix was removed with a thin scissors, interrupted sutures of #1  were used to close the vaginal vault.  Hemostasis was satisfactory.  Lap  and sponge counts were correct.  Blood loss 150 mL.  Abdomen was closed  in layers, peritoneum with continuous suture of 0 chromic, fascia with  continuous suture of 0 Dexon, and skin closed with subcuticular stitch  of 4-0 Monocryl.  Blood loss 150.  The patient tolerated the procedure  well and taken to recovery room in good condition.          ______________________________  Kathreen Cosier, M.D.    BAM/MEDQ  D:  03/16/2008  T:  03/17/2008  Job:  [id number]

## 2010-08-28 NOTE — Discharge Summary (Signed)
NAMEADALAI, PERL                ACCOUNT NO.:  [id number]   MEDICAL RECORD NO.:  [id number]          PATIENT TYPE:  INP   LOCATION:  6709                         FACILITY:  [facility]   PHYSICIAN:  Ruthy Dick, MD    DATE OF BIRTH:  12-29-1960   DATE OF ADMISSION:  11/22/2008  DATE OF DISCHARGE:  11/25/2008                               DISCHARGE SUMMARY   REASON FOR ADMISSION:  Diarrhea.   FINAL DISCHARGE DIAGNOSES:  1. Acute Clostridium difficile colitis.  2. Hypokalemia.  3. Tobacco abuse.  4. Iron deficiency anemia.  5. Hypertension.  6. Recent pneumonia.  7. Degenerative joint disease.  8. Anxiety disorder.  9. Dysfunctional uterine bleeding secondary to fibroids.   CONSULT DURING THIS ADMISSION:  GI consult.   BRIEF HISTORY OF PRESENT ILLNESS AND HOSPITAL COURSE:  Ms. Ernest Mallick is a 50-  year-old [place] American lady with a past medical history significant  for hypertension, tobacco abuse, and a recent pneumonia, treated with  antibiotics.  She came in because of diarrhea that has been going on for  few days.  She was admitted and workup revealed that the patient has a  positive C. difficile antigens.  The patient was then treated with oral  Flagyl with improvement in her symptoms.   HOSPITAL COURSE:  The patient has done well since then.  She is eating  normally today, no more diarrhea.  She only had one very small episode  today and this is about 1 p.m.  No abdominal pain.  No nausea.  No  vomiting.  No chest pain.  No shortness of breath.  No palpitation.  No  dysuria.  No frequency.  No urgency.  No syncope.   PHYSICAL EXAMINATION:  VITAL SIGNS:  Today, temperature 98.0; pulse 67;  respiration 18; blood pressure 115/80; and saturating 98% on room air.  CHEST:  Clear to auscultation bilateral.  ABDOMEN:  Soft, nontender.  EXTREMITIES:  No clubbing, no cyanosis, no edema.  CARDIOVASCULAR:  First and second heart sounds only.  CENTRAL NERVOUS SYSTEM:  Nonfocal.   The patient has been advised to quit smoking cigarettes.  She is to go  home on Xanax 0.5 mg at bedtime, Norvasc 10 mg p.o. daily, Vicodin  7.5/500 mg q.6 h. p.r.n. for pain, hydrochlorothiazide 25 mg daily,  clonidine 0.1 mg daily, meloxicam 7.5 mg b.i.d., ferrous sulfate 25 mg  daily, metronidazole 250 mg q.i.d. for 12 days making total of 14,  Florastor 250 mg p.o. b.i.d. for 12 days making total of 14 days.   She is to follow up with Dr. Christella Hartigan in about a month.  Dr. Christella Hartigan'  office to arrange follow up.  She is also to follow up with Dr. Bruna Potter,  her primary care physician in about 2-3 weeks.  The patient is to call  for appointment.  The  patient is to call Dr. Christella Hartigan' office tonight if there are restarts.  I  have also taken the privilege of repeating the chest x-ray on this  patient to follow up to her previous pneumonia, which the patient had  during last  admission.  The patient is stable enough for discharge  today.      Ruthy Dick, MD  Electronically Signed     GU/MEDQ  D:  11/25/2008  T:  11/26/2008  Job:  [id number]   cc:   Dr. Christella Hartigan  Dr. Bruna Potter

## 2010-08-31 NOTE — Discharge Summary (Signed)
NAMEANNELL, CANTY                ACCOUNT NO.:  [id number]   MEDICAL RECORD NO.:  [id number]          PATIENT TYPE:  INP   LOCATION:  9313                          [facility]   PHYSICIAN:  Kathreen Cosier, M.D.DATE OF BIRTH:  1960-07-19   DATE OF ADMISSION:  03/16/2008  DATE OF DISCHARGE:  03/18/2008                               DISCHARGE SUMMARY   The patient is a 50 year old gravida 3, para 3 with a long history of  large myoma with heavy bleeding requiring transfusion of 3 units of  packed cells in October 2009.  She was also hypertensive, on clonidine  0.1 daily and hydrochlorothiazide.  The patient had a C-section in the  past and she also used cocaine.  She had a 20-week sized myoma, and on  March 16, 2008, underwent a TAH and RSO and a left salpingectomy.  Postoperatively, she did well.  She was discharged on the second  postoperative day, ambulates, on her regular diet.   MEDICATIONS:  1. Tylox.  2. Clonidine.  3. Norvasc.  4. Hydrochlorothiazide.  To see me in 4 weeks.   LABORATORY DATA:  Her hemoglobin was 11 on admission, 8.8 postoperative.  White count is a 11.4 and 16.  Platelets were 439 and 392.  Sodium 140,  potassium 3.3, chloride 104, and HIV negative.  She was discharged on  the second postoperative day,  ambulatory, on a regular diet.  To see me  in 6 weeks.   DISCHARGE DIAGNOSES:  Status post total abdominal hysterectomy because  of large myoma.           ______________________________  Kathreen Cosier, M.D.     BAM/MEDQ  D:  04/20/2008  T:  04/20/2008  Job:  [id number]

## 2010-11-08 ENCOUNTER — Ambulatory Visit
Admission: RE | Admit: 2010-11-08 | Discharge: 2010-11-08 | Disposition: A | Discharge status: Home / Self Care | Payer: Medicaid Other | Source: Ambulatory Visit | Attending: Family Medicine | Admitting: Family Medicine

## 2010-11-08 ENCOUNTER — Other Ambulatory Visit: Payer: Self-pay | Admitting: Family Medicine

## 2010-11-08 DIAGNOSIS — M545 Low back pain, unspecified: Secondary | ICD-10-CM

## 2010-11-15 ENCOUNTER — Other Ambulatory Visit: Payer: Self-pay | Admitting: Family Medicine

## 2010-11-15 DIAGNOSIS — R921 Mammographic calcification found on diagnostic imaging of breast: Secondary | ICD-10-CM

## 2010-11-15 DIAGNOSIS — N63 Unspecified lump in unspecified breast: Secondary | ICD-10-CM

## 2011-01-08 LAB — COMPREHENSIVE METABOLIC PANEL
BUN: 6
CO2: 23
Calcium: 8.8
Creatinine, Ser: 0.83
GFR calc non Af Amer: >60
Glucose, Bld: 82

## 2011-01-08 LAB — CBC
HCT: 27.1 — ABNORMAL LOW
Hemoglobin: 6.1 — CL
Hemoglobin: 8.5 — ABNORMAL LOW
Hemoglobin: 8.8 — ABNORMAL LOW
Hemoglobin: 9.1 — ABNORMAL LOW
MCHC: 31.5
MCHC: 31.6
MCHC: 33
MCV: 64.1 — ABNORMAL LOW
MCV: 71.8 — ABNORMAL LOW
MCV: 73 — ABNORMAL LOW
Platelets: 206
RBC: 3.03 — ABNORMAL LOW
RBC: 3.62 — ABNORMAL LOW
RBC: 4.01
RBC: 4.04
RDW: 23.6 — ABNORMAL HIGH
RDW: 30.1 — ABNORMAL HIGH
WBC: 13.1 — ABNORMAL HIGH
WBC: 23.4 — ABNORMAL HIGH

## 2011-01-08 LAB — IRON AND TIBC
Iron: 331 — ABNORMAL HIGH
UIBC: 125

## 2011-01-08 LAB — URINALYSIS, ROUTINE W REFLEX MICROSCOPIC
Glucose, UA: NEGATIVE
Hgb urine dipstick: NEGATIVE
Ketones, ur: NEGATIVE
Nitrite: NEGATIVE
Protein, ur: NEGATIVE
Specific Gravity, Urine: 1.018
Urobilinogen, UA: 1

## 2011-01-08 LAB — BASIC METABOLIC PANEL
BUN: 9
CO2: 24
CO2: 25
Calcium: 9
Chloride: 103
Chloride: 104
Creatinine, Ser: 1.04
GFR calc Af Amer: >60
GFR calc Af Amer: >60
Glucose, Bld: 92
Glucose, Bld: 97
Potassium: 3.6
Potassium: 3.8
Sodium: 137
Sodium: 137

## 2011-01-08 LAB — CROSSMATCH

## 2011-01-08 LAB — RAPID URINE DRUG SCREEN, HOSP PERFORMED
Benzodiazepines: NOT DETECTED
Cocaine: POSITIVE — AB
Tetrahydrocannabinol: NOT DETECTED

## 2011-01-08 LAB — POCT I-STAT, CHEM 8
Calcium, Ion: 1.2
Creatinine, Ser: 1.1
Glucose, Bld: 87
HCT: 24 — ABNORMAL LOW
Hemoglobin: 8.2 — ABNORMAL LOW
Potassium: 3.7
TCO2: 19

## 2011-01-08 LAB — ABO/RH: ABO/RH(D): A NEG

## 2011-01-08 LAB — CULTURE, RESPIRATORY W GRAM STAIN

## 2011-01-08 LAB — GAMMA GT: GGT: 26

## 2011-01-08 LAB — EXPECTORATED SPUTUM ASSESSMENT W GRAM STAIN, RFLX TO RESP C

## 2011-01-08 LAB — URINE MICROSCOPIC-ADD ON

## 2011-01-08 LAB — URINE CULTURE

## 2011-01-08 LAB — PROTIME-INR: INR: 1.1

## 2011-01-08 LAB — CK: Total CK: 323 — ABNORMAL HIGH

## 2011-01-14 LAB — URINALYSIS, ROUTINE W REFLEX MICROSCOPIC
Bilirubin Urine: NEGATIVE
Glucose, UA: NEGATIVE
Ketones, ur: NEGATIVE
Nitrite: NEGATIVE
Protein, ur: NEGATIVE
Specific Gravity, Urine: 1.013
Urobilinogen, UA: 0.2
pH: 6.5

## 2011-01-14 LAB — URINE MICROSCOPIC-ADD ON

## 2011-01-14 LAB — APTT: aPTT: 35

## 2011-01-14 LAB — CBC
Hemoglobin: 5.6 — CL
MCHC: 30.5
Platelets: 950
RDW: 30.1 — ABNORMAL HIGH

## 2011-01-14 LAB — RAPID URINE DRUG SCREEN, HOSP PERFORMED
Cocaine: POSITIVE — AB
Opiates: POSITIVE — AB
Tetrahydrocannabinol: NOT DETECTED

## 2011-01-14 LAB — B-NATRIURETIC PEPTIDE (CONVERTED LAB): Pro B Natriuretic peptide (BNP): <30

## 2011-01-14 LAB — DIFFERENTIAL
Basophils Absolute: 0
Basophils Relative: 0
Blasts: 0
Lymphocytes Relative: 15
Lymphs Abs: 3.2
Myelocytes: 0
Neutro Abs: 18 — ABNORMAL HIGH
Neutrophils Relative %: 83 — ABNORMAL HIGH
Promyelocytes Absolute: 0
nRBC: 0

## 2011-01-14 LAB — TYPE AND SCREEN
ABO/RH(D): A NEG
Antibody Screen: NEGATIVE

## 2011-01-14 LAB — ETHANOL: Alcohol, Ethyl (B): <5

## 2011-01-14 LAB — PREPARE RBC (CROSSMATCH)

## 2011-01-14 LAB — PATHOLOGIST SMEAR REVIEW

## 2011-01-14 LAB — CK TOTAL AND CKMB (NOT AT ARMC): CK, MB: 0.7

## 2011-01-14 LAB — URINE CULTURE

## 2011-01-14 LAB — WET PREP, GENITAL: Yeast Wet Prep HPF POC: NONE SEEN

## 2011-01-14 LAB — PROTIME-INR
INR: 1.1
Prothrombin Time: 14.3

## 2011-01-14 LAB — POCT PREGNANCY, URINE: Preg Test, Ur: NEGATIVE

## 2011-01-14 LAB — GC/CHLAMYDIA PROBE AMP, GENITAL
Chlamydia, DNA Probe: NEGATIVE
GC Probe Amp, Genital: NEGATIVE

## 2011-01-15 LAB — CROSSMATCH: ABO/RH(D): A NEG

## 2011-01-15 LAB — CBC
Hemoglobin: 7.4 — CL
MCV: 75.3 — ABNORMAL LOW
Platelets: 867 — ABNORMAL HIGH
RBC: 3.14 — ABNORMAL LOW
RDW: 30.8 — ABNORMAL HIGH
WBC: 17.7 — ABNORMAL HIGH
WBC: 20.5 — ABNORMAL HIGH

## 2011-01-15 LAB — COMPREHENSIVE METABOLIC PANEL
ALT: 8
Alkaline Phosphatase: 52
BUN: 5 — ABNORMAL LOW
Chloride: 105
Glucose, Bld: 103 — ABNORMAL HIGH
Potassium: 3.8
Total Bilirubin: 1.3 — ABNORMAL HIGH

## 2011-01-15 LAB — LACTATE DEHYDROGENASE: LDH: 134

## 2011-01-15 LAB — ABO/RH: ABO/RH(D): A NEG

## 2011-01-18 LAB — PREGNANCY, URINE: Preg Test, Ur: NEGATIVE

## 2011-01-18 LAB — CBC
HCT: 26.6 % — ABNORMAL LOW (ref 36.0–46.0)
Hemoglobin: 11 g/dL — ABNORMAL LOW (ref 12.0–15.0)
Hemoglobin: 8.8 g/dL — ABNORMAL LOW (ref 12.0–15.0)
MCHC: 32.9 g/dL (ref 30.0–36.0)
MCV: 84.1 fL (ref 78.0–100.0)
Platelets: 392 10*3/uL (ref 150–400)
RBC: 3.16 MIL/uL — ABNORMAL LOW (ref 3.87–5.11)
RBC: 4.1 MIL/uL (ref 3.87–5.11)
RDW: 27.5 % — ABNORMAL HIGH (ref 11.5–15.5)
RDW: 27.8 % — ABNORMAL HIGH (ref 11.5–15.5)
WBC: 16 10*3/uL — ABNORMAL HIGH (ref 4.0–10.5)

## 2011-01-18 LAB — COMPREHENSIVE METABOLIC PANEL
ALT: 11 U/L (ref 0–35)
AST: 16 U/L (ref 0–37)
Alkaline Phosphatase: 59 U/L (ref 39–117)
GFR calc Af Amer: >60 mL/min (ref >60)
Glucose, Bld: 103 mg/dL — ABNORMAL HIGH (ref 70–99)
Potassium: 3.3 mEq/L — ABNORMAL LOW (ref 3.5–5.1)
Sodium: 140 mEq/L (ref 135–145)
Total Protein: 7.8 g/dL (ref 6.0–8.3)

## 2011-05-13 ENCOUNTER — Encounter (HOSPITAL_BASED_OUTPATIENT_CLINIC_OR_DEPARTMENT_OTHER): Payer: Self-pay

## 2011-05-13 ENCOUNTER — Ambulatory Visit (HOSPITAL_BASED_OUTPATIENT_CLINIC_OR_DEPARTMENT_OTHER)
Admission: RE | Admit: 2011-05-13 | Discharge: 2011-05-13 | Disposition: A | Discharge status: Home / Self Care | Payer: Medicaid Other | Source: Ambulatory Visit | Attending: Orthopedic Surgery | Admitting: Orthopedic Surgery

## 2011-05-13 ENCOUNTER — Ambulatory Visit (HOSPITAL_BASED_OUTPATIENT_CLINIC_OR_DEPARTMENT_OTHER): Payer: Medicaid Other | Admitting: Anesthesiology

## 2011-05-13 ENCOUNTER — Encounter (HOSPITAL_BASED_OUTPATIENT_CLINIC_OR_DEPARTMENT_OTHER)
Admission: RE | Disposition: A | Discharge status: Home / Self Care | Payer: Self-pay | Source: Ambulatory Visit | Attending: Orthopedic Surgery

## 2011-05-13 ENCOUNTER — Encounter (HOSPITAL_BASED_OUTPATIENT_CLINIC_OR_DEPARTMENT_OTHER): Payer: Self-pay | Admitting: Anesthesiology

## 2011-05-13 DIAGNOSIS — G5600 Carpal tunnel syndrome, unspecified upper limb: Secondary | ICD-10-CM | POA: Insufficient documentation

## 2011-05-13 DIAGNOSIS — I10 Essential (primary) hypertension: Secondary | ICD-10-CM | POA: Insufficient documentation

## 2011-05-13 DIAGNOSIS — E119 Type 2 diabetes mellitus without complications: Secondary | ICD-10-CM | POA: Insufficient documentation

## 2011-05-13 DIAGNOSIS — R011 Cardiac murmur, unspecified: Secondary | ICD-10-CM | POA: Insufficient documentation

## 2011-05-13 DIAGNOSIS — F411 Generalized anxiety disorder: Secondary | ICD-10-CM | POA: Insufficient documentation

## 2011-05-13 DIAGNOSIS — M129 Arthropathy, unspecified: Secondary | ICD-10-CM | POA: Insufficient documentation

## 2011-05-13 HISTORY — DX: Anxiety disorder, unspecified: F41.9

## 2011-05-13 HISTORY — DX: Essential (primary) hypertension: I10

## 2011-05-13 HISTORY — PX: CARPAL TUNNEL RELEASE: SHX101

## 2011-05-13 LAB — POCT I-STAT, CHEM 8
Chloride: 108 mEq/L (ref 96–112)
HCT: 40 % (ref 36.0–46.0)
Hemoglobin: 13.6 g/dL (ref 12.0–15.0)
Potassium: 3.7 mEq/L (ref 3.5–5.1)
Sodium: 141 mEq/L (ref 135–145)

## 2011-05-13 SURGERY — CARPAL TUNNEL RELEASE
Anesthesia: General | Site: Hand | Laterality: Left | Wound class: Clean

## 2011-05-13 MED ORDER — CEFAZOLIN SODIUM-DEXTROSE 2-3 GM-% IV SOLR
2.0000 g | Freq: Once | INTRAVENOUS | Status: AC
  Administered 2011-05-13: 2 g via INTRAVENOUS

## 2011-05-13 MED ORDER — HYDROMORPHONE HCL PF 1 MG/ML IJ SOLN
0.2500 mg | INTRAMUSCULAR | Status: DC | PRN
  Administered 2011-05-13 (×2): 0.5 mg via INTRAVENOUS

## 2011-05-13 MED ORDER — OXYCODONE-ACETAMINOPHEN 5-325 MG PO TABS: 1.0000 | ORAL_TABLET | ORAL | Status: AC | PRN

## 2011-05-13 MED ORDER — PROPOFOL 10 MG/ML IV EMUL
INTRAVENOUS | Status: DC | PRN
  Administered 2011-05-13: 200 mg via INTRAVENOUS

## 2011-05-13 MED ORDER — FENTANYL CITRATE 0.05 MG/ML IJ SOLN
INTRAMUSCULAR | Status: DC | PRN
  Administered 2011-05-13: 50 ug via INTRAVENOUS

## 2011-05-13 MED ORDER — LIDOCAINE HCL (CARDIAC) 20 MG/ML IV SOLN
INTRAVENOUS | Status: DC | PRN
  Administered 2011-05-13: 100 mg via INTRAVENOUS

## 2011-05-13 MED ORDER — BUPIVACAINE HCL (PF) 0.25 % IJ SOLN
INTRAMUSCULAR | Status: DC | PRN
  Administered 2011-05-13: 10 mL

## 2011-05-13 MED ORDER — DROPERIDOL 2.5 MG/ML IJ SOLN: 0.6250 mg | INTRAMUSCULAR | Status: DC | PRN

## 2011-05-13 MED ORDER — LACTATED RINGERS IV SOLN
INTRAVENOUS | Status: DC
  Administered 2011-05-13: 13:00:00 via INTRAVENOUS

## 2011-05-13 MED ORDER — ONDANSETRON HCL 4 MG/2ML IJ SOLN
INTRAMUSCULAR | Status: DC | PRN
  Administered 2011-05-13: 4 mg via INTRAVENOUS

## 2011-05-13 MED ORDER — MIDAZOLAM HCL 5 MG/5ML IJ SOLN
INTRAMUSCULAR | Status: DC | PRN
  Administered 2011-05-13: 1 mg via INTRAVENOUS

## 2011-05-13 SURGICAL SUPPLY — 40 items
BANDAGE COBAN STERILE 2 (GAUZE/BANDAGES/DRESSINGS) ×2 IMPLANT
BANDAGE CONFORM 2  STR LF (GAUZE/BANDAGES/DRESSINGS) ×2 IMPLANT
BANDAGE CONFORM 2X5YD N/S (GAUZE/BANDAGES/DRESSINGS) ×1 IMPLANT
BLADE SURG 15 STRL LF DISP TIS (BLADE) ×2 IMPLANT
BLADE SURG 15 STRL SS (BLADE) ×4
BNDG CMPR 9X4 STRL LF SNTH (GAUZE/BANDAGES/DRESSINGS) ×1
BNDG ESMARK 4X9 LF (GAUZE/BANDAGES/DRESSINGS) ×1 IMPLANT
CHLORAPREP W/TINT 26ML (MISCELLANEOUS) ×2 IMPLANT
CLOTH BEACON ORANGE TIMEOUT ST (SAFETY) ×2 IMPLANT
CORDS BIPOLAR (ELECTRODE) ×2 IMPLANT
COVER TABLE BACK 60X90 (DRAPES) ×2 IMPLANT
DECANTER SPIKE VIAL GLASS SM (MISCELLANEOUS) IMPLANT
DRAPE EXTREMITY T 121X128X90 (DRAPE) ×2 IMPLANT
DRAPE SURG 17X23 STRL (DRAPES) ×2 IMPLANT
DRAPE U 20/CS (DRAPES) ×2 IMPLANT
DRSG EMULSION OIL 3X3 NADH (GAUZE/BANDAGES/DRESSINGS) ×2 IMPLANT
GLOVE BIO SURGEON STRL SZ 6.5 (GLOVE) ×2 IMPLANT
GLOVE BIO SURGEON STRL SZ7.5 (GLOVE) ×2 IMPLANT
GLOVE BIOGEL PI IND STRL 7.0 (GLOVE) IMPLANT
GLOVE BIOGEL PI IND STRL 8 (GLOVE) ×1 IMPLANT
GLOVE BIOGEL PI INDICATOR 7.0 (GLOVE) ×2
GLOVE BIOGEL PI INDICATOR 8 (GLOVE) ×1
GOWN PREVENTION PLUS XLARGE (GOWN DISPOSABLE) ×3 IMPLANT
GOWN PREVENTION PLUS XXLARGE (GOWN DISPOSABLE) ×2 IMPLANT
NDL HYPO 25X1 1.5 SAFETY (NEEDLE) IMPLANT
NEEDLE HYPO 25X1 1.5 SAFETY (NEEDLE) IMPLANT
NS IRRIG 1000ML POUR BTL (IV SOLUTION) ×2 IMPLANT
PACK BASIN DAY SURGERY FS (CUSTOM PROCEDURE TRAY) ×2 IMPLANT
PADDING CAST ABS 4INX4YD NS (CAST SUPPLIES) ×1
PADDING CAST ABS COTTON 4X4 ST (CAST SUPPLIES) ×1 IMPLANT
SPONGE GAUZE 4X4 12PLY (GAUZE/BANDAGES/DRESSINGS) ×2 IMPLANT
SPONGE GAUZE 4X4 FOR O.R. (GAUZE/BANDAGES/DRESSINGS) ×1 IMPLANT
STOCKINETTE 4X48 STRL (DRAPES) ×2 IMPLANT
SUT ETHILON 4 0 PS 2 18 (SUTURE) ×2 IMPLANT
SYR BULB 3OZ (MISCELLANEOUS) ×2 IMPLANT
SYR CONTROL 10ML LL (SYRINGE) ×1 IMPLANT
TOWEL OR 17X24 6PK STRL BLUE (TOWEL DISPOSABLE) ×2 IMPLANT
TOWEL OR NON WOVEN STRL DISP B (DISPOSABLE) ×2 IMPLANT
UNDERPAD 30X30 INCONTINENT (UNDERPADS AND DIAPERS) ×2 IMPLANT
WATER STERILE IRR 1000ML POUR (IV SOLUTION) ×1 IMPLANT

## 2011-05-13 NOTE — Anesthesia Postprocedure Evaluation (Signed)
..   Anesthesia Post Note  Patient: Peggy House  Procedure(s) Performed:  CARPAL TUNNEL RELEASE  Anesthesia type: general  Patient location: PACU  Post pain: Pain level controlled  Post assessment: Patient's Cardiovascular Status Stable  Last Vitals:  Filed Vitals:   05/13/11 1500  BP: 133/75  Pulse: 60  Temp:   Resp: 14    Post vital signs: Reviewed and stable  Level of consciousness: sedated  Complications: No apparent anesthesia complications

## 2011-05-13 NOTE — Anesthesia Preprocedure Evaluation (Signed)
Anesthesia Evaluation  Patient identified by MRN, date of birth, ID band Patient awake    Reviewed: Allergy & Precautions, H&P , NPO status , Patient's Chart, lab work & pertinent test results  History of Anesthesia Complications Negative for: history of anesthetic complications  Airway       Dental   Pulmonary  clear to auscultation  Pulmonary exam normal       Cardiovascular hypertension, Pt. on medications Regular - Systolic murmurs    Neuro/Psych PSYCHIATRIC DISORDERS Anxiety Negative Neurological ROS     GI/Hepatic negative GI ROS, Neg liver ROS,   Endo/Other  Diabetes mellitus-, Type 2, Oral Hypoglycemic AgentsMorbid obesity  Renal/GU negative Renal ROS     Musculoskeletal  (+) Arthritis -, Rheumatoid disorders,    Abdominal   Peds  Hematology negative hematology ROS (+)   Anesthesia Other Findings   Reproductive/Obstetrics                           Anesthesia Physical Anesthesia Plan  ASA: III  Anesthesia Plan: General   Post-op Pain Management:    Induction: Intravenous  Airway Management Planned: LMA  Additional Equipment:   Intra-op Plan:   Post-operative Plan: Extubation in OR  Informed Consent:   Plan Discussed with: CRNA, Anesthesiologist and Surgeon  Anesthesia Plan Comments: (100mg  solucortef for stress dose coverage.)        Anesthesia Quick Evaluation

## 2011-05-13 NOTE — Transfer of Care (Signed)
Immediate Anesthesia Transfer of Care Note  Patient: Peggy House  Procedure(s) Performed:  CARPAL TUNNEL RELEASE  Patient Location: PACU  Anesthesia Type: General  Level of Consciousness: awake, alert  and oriented  Airway & Oxygen Therapy: Patient Spontanous Breathing and Patient connected to face mask oxygen  Post-op Assessment: Report given to PACU RN and Post -op Vital signs reviewed and stable  Post vital signs: Reviewed and stable  Complications: No apparent anesthesia complications

## 2011-05-13 NOTE — H&P (Signed)
Peggy House is an 51 y.o. female.   Chief Complaint: L wrist pain/numbness  HPI: L CTS, failed nonoperative management.  Past Medical History  Diagnosis Date  . Hypertension   . Anxiety     Past Surgical History  Procedure Date  . Abdominal hysterectomy   . L shoulder surgery 2011    Family History  Problem Relation Age of Onset  . Cancer Mother   . Diabetes type II Sister   . Dysmenorrhea Neg Hx    Social History:  reports that she has been smoking Cigarettes.  She has a 15 pack-year smoking history. She does not have any smokeless tobacco history on file. She reports that she does not drink alcohol or use illicit drugs.  Allergies: Not on File  Medications Prior to Admission  Medication Dose Route Frequency Provider Last Rate Last Dose  . lactated ringers infusion   Intravenous Continuous Remonia Richter, MD 20 mL/hr at 05/13/11 1246     Medications Prior to Admission  Medication Sig Dispense Refill  . ALPRAZolam (XANAX) 0.5 MG tablet Take 0.5 mg by mouth at bedtime as needed.      . cloNIDine (CATAPRES) 0.1 MG tablet Take 0.1 mg by mouth 2 (two) times daily.      . ferrous sulfate 325 (65 FE) MG tablet Take 325 mg by mouth daily with breakfast.      . HYDROcodone-acetaminophen (NORCO) 5-325 MG per tablet Take 1 tablet by mouth 2 (two) times daily as needed.      . predniSONE (DELTASONE) 10 MG tablet Take 10 mg by mouth daily.        Results for orders placed during the hospital encounter of 05/13/11 (from the past 48 hour(s))  POCT I-STAT, CHEM 8     Status: Normal   Collection Time   05/13/11 12:41 PM      Component Value Range Comment   Sodium 141  135 - 145 (mEq/L)    Potassium 3.7  3.5 - 5.1 (mEq/L)    Chloride 108  96 - 112 (mEq/L)    BUN 16  6 - 23 (mg/dL)    Creatinine, Ser 0.45  0.50 - 1.10 (mg/dL)    Glucose, Bld 93  70 - 99 (mg/dL)    Calcium, Ion 4.09  1.12 - 1.32 (mmol/L)    TCO2 26  0 - 100 (mmol/L)    Hemoglobin 13.6  12.0 - 15.0 (g/dL)    HCT 81.1  91.4 - 78.2 (%)    No results found.  Review of Systems  All other systems reviewed and are negative.    Blood pressure 119/79, pulse 54, temperature 98.5 F (36.9 C), temperature source Oral, resp. rate 20, height 5\' 6"  (1.676 m), weight 106.142 kg (234 lb), SpO2 100.00%. Physical Exam  Constitutional: She is oriented to person, place, and time. She appears well-developed and well-nourished.  HENT:  Head: Atraumatic.  Eyes: EOM are normal.  Cardiovascular: Intact distal pulses.   Respiratory: Effort normal.  Musculoskeletal:       Left wrist: She exhibits tenderness. She exhibits no swelling and no effusion.  Neurological: She is alert and oriented to person, place, and time.  Skin: Skin is warm and dry.     Assessment/Plan L carpal tunnel syndrome for L CTR. Risks / benefits of surgery discussed Consent on chart  NPO for OR Preop antibiotics    Kiko Ripp WILLIAM 05/13/2011, 1:00 PM

## 2011-05-13 NOTE — Anesthesia Procedure Notes (Signed)
Procedure Name: LMA Insertion Performed by: Jerrion Tabbert Pre-anesthesia Checklist: Patient identified, Timeout performed, Emergency Drugs available, Suction available and Patient being monitored Patient Re-evaluated:Patient Re-evaluated prior to inductionOxygen Delivery Method: Circle System Utilized Preoxygenation: Pre-oxygenation with 100% oxygen Intubation Type: IV induction Ventilation: Mask ventilation without difficulty LMA: LMA with gastric port inserted LMA Size: 4.0 Number of attempts: 1 Tube secured with: Tape Dental Injury: Teeth and Oropharynx as per pre-operative assessment      

## 2011-05-13 NOTE — Op Note (Signed)
Procedure(s): CARPAL TUNNEL RELEASE Procedure Note  KATHELENE RUMBERGER female 51 y.o. 05/13/2011  Procedure(s) and Anesthesia Type:    * CARPAL TUNNEL RELEASE - General  Surgeon(s) and Role:    * Mable Paris, MD - Primary   Indications:  51 y.o. female with left carpal tunnel syndrome. The patient has findings of moderate carpal tunnel syndrome and has failed nonoperative treatment of bracing.     Surgeon: Mable Paris   Assistants: Damita Lack PA-s  Anesthesia: General endotracheal anesthesia   Procedure Detail  CARPAL TUNNEL RELEASE  Findings: Complete release of the transcarpal ligament. The ligament was noted to be centrally thickened. At the conclusion of the procedure the edges of the ligament or widely separated. The underlying nerve appeared to be healthy.  Estimated Blood Loss:  Minimal         Drains: none  Blood Given: none         Specimens: none        Complications:  * No complications entered in OR log *  Tourniquet time: 20 min at 250 mmHg         Disposition: PACU - hemodynamically stable.         Condition: stable    Procedure: The patient was brought to the operating room and was placed supine on the operative table.  General anesthesia was used.  A nonsterile tourniquet was applied and the operative extremity was prepped and draped in the standard sterile fashion.  A 3 cm incision was made in line with the web space between the third and fourth ray extending distally from the flexion crease of the wrist. Dissection was carried down through subcutaneous fat to the palmar fascia which was opened longitudinally in line with the incision. Underlying muscle was swept off the transcarpal ligament and a small rent was made in the ligament with a 15 blade. A Freer elevator was then inserted proximally and distally to protect the contents of the carpal canal while a 15 blade was used under direct visualization to open proximally  and distally. The proximal and distal extents of the transcarpal ligament were then carefully exposed and under direct visualization completely divided using tenotomy scissors. Great care was taken to protect the underlying nerve and distally the palmar arch. The transcarpal ligament was noted to be centrally thickened.  At the conclusion of the procedure the free edges of the transcarpal ligament or widely separated.  The wound was copiously irrigated with normal saline and subsequently closed in one layer with 4-0 nylon in an interrupted fashion.  Sterile dressings were applied including Adaptic 4 x 4's Kling dressing and a lightly wrapped Coban dressing.  The patient was then awakened transferred to a stretcher and taken to the recovery room in stable condition.  Postoperative plan: Patient will be discharged home today and will followup in 10 days for suture removal and wound check.

## 2011-05-14 ENCOUNTER — Encounter (HOSPITAL_BASED_OUTPATIENT_CLINIC_OR_DEPARTMENT_OTHER): Payer: Self-pay | Admitting: Orthopedic Surgery

## 2011-09-17 ENCOUNTER — Other Ambulatory Visit: Payer: Self-pay | Admitting: Family Medicine

## 2011-09-17 ENCOUNTER — Ambulatory Visit
Admission: RE | Admit: 2011-09-17 | Discharge: 2011-09-17 | Disposition: A | Discharge status: Home / Self Care | Payer: Medicaid Other | Source: Ambulatory Visit | Attending: Family Medicine | Admitting: Family Medicine

## 2011-09-17 DIAGNOSIS — I10 Essential (primary) hypertension: Secondary | ICD-10-CM

## 2011-09-18 ENCOUNTER — Inpatient Hospital Stay: Admission: RE | Admit: 2011-09-18 | Payer: Medicaid Other | Source: Ambulatory Visit

## 2011-09-18 ENCOUNTER — Encounter: Payer: Self-pay | Admitting: Gastroenterology

## 2011-10-14 ENCOUNTER — Other Ambulatory Visit: Payer: Medicaid Other | Admitting: Gastroenterology

## 2011-10-22 ENCOUNTER — Ambulatory Visit (AMBULATORY_SURGERY_CENTER): Payer: Medicaid Other

## 2011-10-22 VITALS — Ht 66.0 in | Wt 253.2 lb

## 2011-10-22 DIAGNOSIS — Z1211 Encounter for screening for malignant neoplasm of colon: Secondary | ICD-10-CM

## 2011-10-22 DIAGNOSIS — Z800 Family history of malignant neoplasm of digestive organs: Secondary | ICD-10-CM

## 2011-10-22 MED ORDER — MOVIPREP 100 G PO SOLR: ORAL | Status: DC

## 2011-11-05 ENCOUNTER — Telehealth: Payer: Self-pay | Admitting: Gastroenterology

## 2011-11-05 ENCOUNTER — Other Ambulatory Visit: Payer: Medicaid Other | Admitting: Gastroenterology

## 2011-11-05 NOTE — Telephone Encounter (Signed)
Yes, she should be charged.  She cancelled less than 2 hours prior to the procedure. That spot could have been used by numerous other patients.

## 2011-11-29 ENCOUNTER — Other Ambulatory Visit: Payer: Medicaid Other | Admitting: Gastroenterology

## 2011-11-29 ENCOUNTER — Telehealth: Payer: Self-pay | Admitting: Gastroenterology

## 2011-11-29 NOTE — Telephone Encounter (Signed)
No answer and no voice mail  Pt called and cx the procedure with ENDO

## 2011-12-05 ENCOUNTER — Other Ambulatory Visit (HOSPITAL_COMMUNITY): Payer: Self-pay | Admitting: Oral Surgery

## 2012-01-01 ENCOUNTER — Encounter (HOSPITAL_BASED_OUTPATIENT_CLINIC_OR_DEPARTMENT_OTHER): Payer: Self-pay | Admitting: *Deleted

## 2012-01-01 NOTE — Progress Notes (Signed)
Pt getting serial lumb epidural steroid inj now-will come by for bmet Had ekg 1/13 when had ctr Denies sleep apnea-snores

## 2012-01-06 ENCOUNTER — Ambulatory Visit: Admit: 2012-01-06 | Payer: Self-pay | Admitting: Oral Surgery

## 2012-01-06 ENCOUNTER — Other Ambulatory Visit (HOSPITAL_COMMUNITY): Payer: Medicaid Other

## 2012-01-06 SURGERY — MULTIPLE EXTRACTION WITH ALVEOLOPLASTY
Anesthesia: General | Laterality: Bilateral

## 2012-01-07 ENCOUNTER — Ambulatory Visit (HOSPITAL_BASED_OUTPATIENT_CLINIC_OR_DEPARTMENT_OTHER): Admission: RE | Admit: 2012-01-07 | Payer: Medicaid Other | Source: Ambulatory Visit | Admitting: Oral Surgery

## 2012-01-07 ENCOUNTER — Encounter (HOSPITAL_BASED_OUTPATIENT_CLINIC_OR_DEPARTMENT_OTHER): Admission: RE | Payer: Self-pay | Source: Ambulatory Visit

## 2012-01-07 HISTORY — DX: Snoring: R06.83

## 2012-01-07 SURGERY — DENTAL RESTORATION/EXTRACTIONS
Anesthesia: General

## 2012-09-01 ENCOUNTER — Encounter (HOSPITAL_COMMUNITY): Payer: Self-pay | Admitting: Emergency Medicine

## 2012-09-01 ENCOUNTER — Emergency Department (HOSPITAL_COMMUNITY)
Admission: EM | Admit: 2012-09-01 | Discharge: 2012-09-01 | Disposition: A | Discharge status: Home / Self Care | Payer: Medicaid Other | Attending: Emergency Medicine | Admitting: Emergency Medicine

## 2012-09-01 ENCOUNTER — Emergency Department (HOSPITAL_COMMUNITY): Payer: Medicaid Other

## 2012-09-01 DIAGNOSIS — H5789 Other specified disorders of eye and adnexa: Secondary | ICD-10-CM | POA: Insufficient documentation

## 2012-09-01 DIAGNOSIS — H538 Other visual disturbances: Secondary | ICD-10-CM | POA: Insufficient documentation

## 2012-09-01 DIAGNOSIS — F17200 Nicotine dependence, unspecified, uncomplicated: Secondary | ICD-10-CM | POA: Insufficient documentation

## 2012-09-01 DIAGNOSIS — F411 Generalized anxiety disorder: Secondary | ICD-10-CM | POA: Insufficient documentation

## 2012-09-01 DIAGNOSIS — R220 Localized swelling, mass and lump, head: Secondary | ICD-10-CM | POA: Insufficient documentation

## 2012-09-01 DIAGNOSIS — R0602 Shortness of breath: Secondary | ICD-10-CM | POA: Insufficient documentation

## 2012-09-01 DIAGNOSIS — I10 Essential (primary) hypertension: Secondary | ICD-10-CM | POA: Insufficient documentation

## 2012-09-01 DIAGNOSIS — Z79899 Other long term (current) drug therapy: Secondary | ICD-10-CM | POA: Insufficient documentation

## 2012-09-01 DIAGNOSIS — G510 Bell's palsy: Secondary | ICD-10-CM | POA: Insufficient documentation

## 2012-09-01 DIAGNOSIS — R221 Localized swelling, mass and lump, neck: Secondary | ICD-10-CM | POA: Insufficient documentation

## 2012-09-01 DIAGNOSIS — M1710 Unilateral primary osteoarthritis, unspecified knee: Secondary | ICD-10-CM | POA: Insufficient documentation

## 2012-09-01 DIAGNOSIS — Z8739 Personal history of other diseases of the musculoskeletal system and connective tissue: Secondary | ICD-10-CM | POA: Insufficient documentation

## 2012-09-01 DIAGNOSIS — Z791 Long term (current) use of non-steroidal anti-inflammatories (NSAID): Secondary | ICD-10-CM | POA: Insufficient documentation

## 2012-09-01 DIAGNOSIS — R040 Epistaxis: Secondary | ICD-10-CM | POA: Insufficient documentation

## 2012-09-01 LAB — CBC WITH DIFFERENTIAL/PLATELET
Basophils Absolute: 0 10*3/uL (ref 0.0–0.1)
Eosinophils Relative: 1 % (ref 0–5)
HCT: 32.7 % — ABNORMAL LOW (ref 36.0–46.0)
Hemoglobin: 10.3 g/dL — ABNORMAL LOW (ref 12.0–15.0)
Lymphocytes Relative: 19 % (ref 12–46)
Monocytes Relative: 3 % (ref 3–12)
Neutro Abs: 10.3 10*3/uL — ABNORMAL HIGH (ref 1.7–7.7)
RBC: 4.21 MIL/uL (ref 3.87–5.11)
RDW: 18.3 % — ABNORMAL HIGH (ref 11.5–15.5)
WBC: 13.3 10*3/uL — ABNORMAL HIGH (ref 4.0–10.5)

## 2012-09-01 LAB — BASIC METABOLIC PANEL
GFR calc Af Amer: 76 mL/min — ABNORMAL LOW (ref >90)
GFR calc non Af Amer: 66 mL/min — ABNORMAL LOW (ref >90)
Glucose, Bld: 150 mg/dL — ABNORMAL HIGH (ref 70–99)
Potassium: 3.6 mEq/L (ref 3.5–5.1)
Sodium: 140 mEq/L (ref 135–145)

## 2012-09-01 MED ORDER — ACYCLOVIR 400 MG PO TABS: 400.0000 mg | ORAL_TABLET | Freq: Three times a day (TID) | ORAL | Status: DC

## 2012-09-01 MED ORDER — PREDNISONE 20 MG PO TABS: 40.0000 mg | ORAL_TABLET | Freq: Every day | ORAL | Status: DC

## 2012-09-01 NOTE — ED Provider Notes (Signed)
Affect when-year-old female noted onset yesterday of facial droop, watering of her left eye. She noted that things are drooling from the side of her mouth when she tried to brush her teeth. On exam, there is mild left central facial droop along with weakness of closure of the left thigh consistent with Bell's palsy. No other neurologic deficit is seen. She will be treated with a course of steroids and antiviral medication.  Medical screening examination/treatment/procedure(s) were conducted as a shared visit with non-physician practitioner(s) and myself.  I personally evaluated the patient during the encounter   Dione Booze, MD 09/01/12 [phone]

## 2012-09-01 NOTE — ED Notes (Signed)
Pt c/o left sided facial droop x 2 days with eye watering; no other deficit noted; pt sts some exertional SOB x 2 weeks and not feeling well

## 2012-09-01 NOTE — ED Provider Notes (Signed)
History     CSN: [id number]  Arrival date & time 09/01/12  1232   First MD Initiated Contact with Patient 09/01/12 1506      Chief Complaint  Patient presents with  . Facial Droop    (Consider location/radiation/quality/duration/timing/severity/associated sxs/prior treatment) HPI Pt is a 52yo female c/o left sided facial droop and watery eye & blurred vision x1 day associated with left sided facial swelling and SOB x2 weeks.  Pt states she had a nose bleed around that time and has since "not been feeling well."  Denies facial pain, cough, fever, or rashes.  Denies new medications.  Past Medical History  Diagnosis Date  . Hypertension   . Anxiety   . History of swelling of feet   . Snores   . Arthritis     knnes,back    Past Surgical History  Procedure Laterality Date  . Abdominal hysterectomy    . L shoulder surgery  2011  . Carpal tunnel release  05/13/2011    Procedure: CARPAL TUNNEL RELEASE;  Surgeon: Mable Paris, MD;  Location: [facility];  Service: Orthopedics;  Laterality: Left;  . Dilation and curettage of uterus    . Dg toes*l*  2/10    rt    Family History  Problem Relation Age of Onset  . Cancer Mother   . Ovarian cancer Mother   . Diabetes type II Sister   . Dysmenorrhea Neg Hx   . Diabetes Sister   . Colon cancer Maternal Grandfather   . Stomach cancer Maternal Grandmother     History  Substance Use Topics  . Smoking status: Current Every Day Smoker -- 0.50 packs/day for 30 years    Types: Cigarettes  . Smokeless tobacco: Never Used  . Alcohol Use: No    OB History   Grav Para Term Preterm Abortions TAB SAB Ect Mult Living                  Review of Systems  Constitutional: Negative for fever and chills.  HENT: Positive for nosebleeds ( 2 weeks ago), facial swelling ( left side) and drooling ( left side). Negative for hearing loss, ear pain, sore throat, trouble swallowing, neck pain, neck stiffness and voice  change.   Respiratory: Positive for shortness of breath. Negative for cough.   Cardiovascular: Negative for chest pain.  All other systems reviewed and are negative.    Allergies  Tomato  Home Medications   Current Outpatient Rx  Name  Route  Sig  Dispense  Refill  . ALPRAZolam (XANAX) 0.5 MG tablet   Oral   Take 0.5 mg by mouth daily.          [name] amLODipine (NORVASC) 10 MG tablet   Oral   Take 10 mg by mouth daily.         . Chlorpheniramine Maleate (ALLERGY PO)   Oral   Take 2 tablets by mouth daily as needed (allergies).         . Cyclobenzaprine HCl (FLEXERIL PO)   Oral   Take 1 tablet by mouth 2 (two) times daily.         . furosemide (LASIX) 40 MG tablet   Oral   Take 40 mg by mouth daily.         [name] HYDROcodone-acetaminophen (VICODIN) 5-500 MG per tablet   Oral   Take 1 tablet by mouth every 6 (six) hours as needed.         [name]  Iron TABS   Oral   Take 65 mg by mouth daily.         . metoprolol (LOPRESSOR) 50 MG tablet   Oral   Take 50 mg by mouth daily.         . pravastatin (PRAVACHOL) 40 MG tablet   Oral   Take 40 mg by mouth daily.         [name] PRESCRIPTION MEDICATION   Oral   Take 1 tablet by mouth 2 (two) times daily. ibuprofen         . acyclovir (ZOVIRAX) 400 MG tablet   Oral   Take 1 tablet (400 mg total) by mouth 3 (three) times daily.   30 tablet   0   . predniSONE (DELTASONE) 20 MG tablet   Oral   Take 2 tablets (40 mg total) by mouth daily. Take 40 mg by mouth daily for 3 days, then 20mg  by mouth daily for 3 days, then 10mg  daily for 3 days   12 tablet   0     BP 129/81  Pulse 56  Temp(Src) 98.4 F (36.9 C) (Oral)  Resp 18  SpO2 100%  Physical Exam  Nursing note and vitals reviewed. Constitutional: She is oriented to person, place, and time. She appears well-developed and well-nourished. No distress.  Morbidly obese female lying upright on exam bed.  Left sided facial swelling and facial droop   HENT:   Head: Normocephalic and atraumatic. No trismus in the jaw.  Right Ear: Hearing, tympanic membrane, external ear and ear canal normal.  Left Ear: Hearing, tympanic membrane, external ear and ear canal normal.  Nose: Nose normal.  Mouth/Throat: Uvula is midline, oropharynx is clear and moist and mucous membranes are normal. No oral lesions. Normal dentition. No dental abscesses. No oropharyngeal exudate, posterior oropharyngeal edema, posterior oropharyngeal erythema or tonsillar abscesses.  Eyes: Conjunctivae and EOM are normal. Pupils are equal, round, and reactive to light. Right eye exhibits no discharge. Left eye exhibits discharge ( clear). No scleral icterus.  Neck: Normal range of motion. Neck supple. No JVD present. No tracheal deviation present. No thyromegaly present.  Supple, no nuchal rigidity. No meningeal signs  Cardiovascular: Normal rate, regular rhythm and normal heart sounds.   Pulmonary/Chest: Effort normal and breath sounds normal. No stridor. No respiratory distress. She has no wheezes. She has no rales. She exhibits no tenderness.  Musculoskeletal: Normal range of motion.  Lymphadenopathy:    She has no cervical adenopathy.  Neurological: She is alert and oriented to person, place, and time. She has normal strength. A cranial nerve deficit ( CN VII, left side ) is present. She displays a negative Romberg sign. Coordination and gait normal.  Drooping of left eyebrow and left corner of mouth.  Skin: Skin is warm and dry. No rash noted. She is not diaphoretic. No erythema.  Psychiatric: She has a normal mood and affect. Her behavior is normal.    ED Course  Procedures (including critical care time)  Labs Reviewed  CBC WITH DIFFERENTIAL - Abnormal; Notable for the following:    WBC 13.3 (*)    Hemoglobin 10.3 (*)    HCT 32.7 (*)    MCV 77.7 (*)    MCH 24.5 (*)    RDW 18.3 (*)    Neutro Abs 10.3 (*)    All other components within normal limits  BASIC METABOLIC PANEL  - Abnormal; Notable for the following:    Glucose, Bld 150 (*)  GFR calc non Af Amer 66 (*)    GFR calc Af Amer 76 (*)    All other components within normal limits   No results found.   1. Bell's palsy       MDM  Pt presenting with left sided facial swelling and drooping x1 day associated with DOE x2 weeks after 1 episode of epistaxis.  Denies facial pain, fever, cough, or rashes.  No left ear s/s. CN VII deficit on left side.  Deficit involves muscles of forehead.  Not concerned for CVA.  Classic signs of Bells Palsy.   CBC: mild elevation in wbc-13.3, iron deficiency anemia CXR: no edema or consolidation   Discussed pt with Dr. Preston Fleeting who also examined pt and agreed with assessment and plan. Discharged pt home.  Rx: acyclovir and prednisone  Vitals: unremarkable. Discharged in stable condition.            Junius Finner, PA-C 09/03/12 1640

## 2012-10-26 ENCOUNTER — Other Ambulatory Visit: Payer: Self-pay | Admitting: Family Medicine

## 2012-10-26 ENCOUNTER — Ambulatory Visit
Admission: RE | Admit: 2012-10-26 | Discharge: 2012-10-26 | Disposition: A | Discharge status: Home / Self Care | Payer: Medicaid Other | Source: Ambulatory Visit | Attending: Family Medicine | Admitting: Family Medicine

## 2012-10-26 DIAGNOSIS — M24873 Other specific joint derangements of unspecified ankle, not elsewhere classified: Secondary | ICD-10-CM

## 2012-10-26 DIAGNOSIS — R062 Wheezing: Secondary | ICD-10-CM

## 2012-10-26 DIAGNOSIS — M24876 Other specific joint derangements of unspecified foot, not elsewhere classified: Secondary | ICD-10-CM

## 2013-08-24 ENCOUNTER — Encounter (HOSPITAL_COMMUNITY): Payer: Self-pay | Admitting: Emergency Medicine

## 2013-08-24 ENCOUNTER — Emergency Department (HOSPITAL_COMMUNITY): Payer: Medicaid Other

## 2013-08-24 ENCOUNTER — Emergency Department (HOSPITAL_COMMUNITY)
Admission: EM | Admit: 2013-08-24 | Discharge: 2013-08-25 | Disposition: A | Discharge status: Home / Self Care | Payer: Medicaid Other | Attending: Emergency Medicine | Admitting: Emergency Medicine

## 2013-08-24 DIAGNOSIS — Z8739 Personal history of other diseases of the musculoskeletal system and connective tissue: Secondary | ICD-10-CM | POA: Insufficient documentation

## 2013-08-24 DIAGNOSIS — Z79899 Other long term (current) drug therapy: Secondary | ICD-10-CM | POA: Insufficient documentation

## 2013-08-24 DIAGNOSIS — I10 Essential (primary) hypertension: Secondary | ICD-10-CM | POA: Insufficient documentation

## 2013-08-24 DIAGNOSIS — R109 Unspecified abdominal pain: Secondary | ICD-10-CM

## 2013-08-24 DIAGNOSIS — F411 Generalized anxiety disorder: Secondary | ICD-10-CM | POA: Insufficient documentation

## 2013-08-24 DIAGNOSIS — F17200 Nicotine dependence, unspecified, uncomplicated: Secondary | ICD-10-CM | POA: Insufficient documentation

## 2013-08-24 LAB — CBC WITH DIFFERENTIAL/PLATELET
BASOS ABS: 0 10*3/uL (ref 0.0–0.1)
Basophils Relative: 0 % (ref 0–1)
EOS PCT: 1 % (ref 0–5)
Eosinophils Absolute: 0.2 10*3/uL (ref 0.0–0.7)
HEMATOCRIT: 31.3 % — AB (ref 36.0–46.0)
HEMOGLOBIN: 9.3 g/dL — AB (ref 12.0–15.0)
LYMPHS ABS: 3.5 10*3/uL (ref 0.7–4.0)
LYMPHS PCT: 25 % (ref 12–46)
MCH: 23.1 pg — ABNORMAL LOW (ref 26.0–34.0)
MCHC: 29.7 g/dL — ABNORMAL LOW (ref 30.0–36.0)
MCV: 77.9 fL — AB (ref 78.0–100.0)
MONO ABS: 0.5 10*3/uL (ref 0.1–1.0)
MONOS PCT: 4 % (ref 3–12)
NEUTROS ABS: 9.9 10*3/uL — AB (ref 1.7–7.7)
Neutrophils Relative %: 70 % (ref 43–77)
Platelets: 290 10*3/uL (ref 150–400)
RBC: 4.02 MIL/uL (ref 3.87–5.11)
RDW: 19.2 % — AB (ref 11.5–15.5)
WBC: 14 10*3/uL — AB (ref 4.0–10.5)

## 2013-08-24 LAB — URINALYSIS, ROUTINE W REFLEX MICROSCOPIC
BILIRUBIN URINE: NEGATIVE
GLUCOSE, UA: NEGATIVE mg/dL
HGB URINE DIPSTICK: NEGATIVE
KETONES UR: NEGATIVE mg/dL
LEUKOCYTES UA: NEGATIVE
Nitrite: NEGATIVE
PH: 8.5 — AB (ref 5.0–8.0)
PROTEIN: 30 mg/dL — AB
Specific Gravity, Urine: 1.022 (ref 1.005–1.030)
Urobilinogen, UA: 1 mg/dL (ref 0.0–1.0)

## 2013-08-24 LAB — URINE MICROSCOPIC-ADD ON

## 2013-08-24 NOTE — ED Notes (Signed)
Patient arrives from home with complaint of lower back pain. States no injury. Explains that the pain sometimes feels like it moves laterally on the right and she can feel it in her abdomen for a moment. Denies history of kidney stones.

## 2013-08-24 NOTE — ED Provider Notes (Signed)
CSN: [id number]     Arrival date & time 08/24/13  2039 History   First MD Initiated Contact with Patient 08/24/13 2307     Chief Complaint  Patient presents with  . Back Pain     (Consider location/radiation/quality/duration/timing/severity/associated sxs/prior Treatment) Patient is a 53 y.o. female presenting with back pain. The history is provided by the patient and the spouse.  Back Pain  She complains of intermittent right flank pain, radiating to the right upper abdomen, since yesterday. At the time of evaluation in the emergency, Department, she's not having any pain. She denies fever, chills, nausea, vomiting, weakness, or dizziness. She has not had this problem previously. There's been no current frequency, dysuria, hematuria; no changes in bowels. She's not tried any medication for the problem. There are no other known modifying factors.  Past Medical History  Diagnosis Date  . Hypertension   . Anxiety   . History of swelling of feet   . Snores   . Arthritis     knnes,back   Past Surgical History  Procedure Laterality Date  . Abdominal hysterectomy    . L shoulder surgery  2011  . Carpal tunnel release  05/13/2011    Procedure: CARPAL TUNNEL RELEASE;  Surgeon: Nita Sells, MD;  Location: [facility];  Service: Orthopedics;  Laterality: Left;  . Dilation and curettage of uterus    . Dg toes*l*  2/10    rt   Family History  Problem Relation Age of Onset  . Cancer Mother   . Ovarian cancer Mother   . Diabetes type II Sister   . Dysmenorrhea Neg Hx   . Diabetes Sister   . Colon cancer Maternal Grandfather   . Stomach cancer Maternal Grandmother    History  Substance Use Topics  . Smoking status: Current Every Day Smoker -- 0.50 packs/day for 30 years    Types: Cigarettes  . Smokeless tobacco: Never Used  . Alcohol Use: No   OB History   Grav Para Term Preterm Abortions TAB SAB Ect Mult Living                 Review of Systems   Musculoskeletal: Positive for back pain.  All other systems reviewed and are negative.     Allergies  Tomato  Home Medications   Prior to Admission medications   Medication Sig Start Date End Date Taking? Authorizing Provider  ALPRAZolam Duanne Moron) 1 MG tablet Take 1 mg by mouth at bedtime.   Yes Historical Provider, MD  amLODipine (NORVASC) 10 MG tablet Take 10 mg by mouth daily.   Yes Historical Provider, MD  diphenhydrAMINE (BENADRYL) 25 MG tablet Take 50 mg by mouth at bedtime as needed for allergies.   Yes Historical Provider, MD  furosemide (LASIX) 40 MG tablet Take 40 mg by mouth daily.   Yes Historical Provider, MD  ibuprofen (ADVIL,MOTRIN) 200 MG tablet Take 400 mg by mouth 2 (two) times daily as needed (pain).   Yes Historical Provider, MD  ibuprofen (ADVIL,MOTRIN) 800 MG tablet Take 800 mg by mouth at bedtime as needed (pain).   Yes Historical Provider, MD  Iron TABS Take 1 tablet by mouth daily.    Yes Historical Provider, MD  metoprolol (LOPRESSOR) 50 MG tablet Take 50 mg by mouth daily.   Yes Historical Provider, MD  pravastatin (PRAVACHOL) 40 MG tablet Take 40 mg by mouth daily.   Yes Historical Provider, MD   BP 131/76  Pulse 65  Temp(Src) 98 F (36.7 C) (Oral)  Resp 20  Ht 5' 6.5" (1.689 m)  Wt 267 lb 3.2 oz (121.201 kg)  BMI 42.49 kg/m2  SpO2 95% Physical Exam  Nursing note and vitals reviewed. Constitutional: She is oriented to person, place, and time. She appears well-developed and well-nourished.  HENT:  Head: Normocephalic and atraumatic.  Eyes: Conjunctivae and EOM are normal. Pupils are equal, round, and reactive to light.  Neck: Normal range of motion and phonation normal. Neck supple.  Cardiovascular: Normal rate, regular rhythm and intact distal pulses.   Pulmonary/Chest: Effort normal and breath sounds normal. She exhibits no tenderness.  Abdominal: Soft. She exhibits no distension and no mass. There is tenderness (mild upper quadrant tenderness to  deep palpation). There is no rebound and no guarding.  Genitourinary:  No costovertebral angle tenderness  Musculoskeletal: Normal range of motion.  Neurological: She is alert and oriented to person, place, and time. She exhibits normal muscle tone.  Skin: Skin is warm and dry.  Psychiatric: She has a normal mood and affect. Her behavior is normal. Judgment and thought content normal.    ED Course  Procedures (including critical care time)  Medications - No data to display  Patient Vitals for the past 24 hrs:  BP Temp Temp src Pulse Resp SpO2 Height Weight  08/25/13 0106 131/76 mmHg 98 F (36.7 C) Oral 65 20 95 % - -  08/24/13 2234 - 98.2 F (36.8 C) Oral 60 18 99 % - -  08/24/13 2100 138/75 mmHg 98.7 F (37.1 C) Oral 63 16 99 % 5' 6.5" (1.689 m) 267 lb 3.2 oz (121.201 kg)    1:00 AM Reevaluation with update and discussion. After initial assessment and treatment, an updated evaluation reveals patient is sleeping. She arouses easily. She has no additional complaints. Findings discussed with her, all questions answered. Richarda Blade     Labs Review Labs Reviewed  URINALYSIS, ROUTINE W REFLEX MICROSCOPIC - Abnormal; Notable for the following:    pH 8.5 (*)    Protein, ur 30 (*)    All other components within normal limits  CBC WITH DIFFERENTIAL - Abnormal; Notable for the following:    WBC 14.0 (*)    Hemoglobin 9.3 (*)    HCT 31.3 (*)    MCV 77.9 (*)    MCH 23.1 (*)    MCHC 29.7 (*)    RDW 19.2 (*)    Neutro Abs 9.9 (*)    All other components within normal limits  COMPREHENSIVE METABOLIC PANEL - Abnormal; Notable for the following:    Potassium 3.2 (*)    Glucose, Bld 114 (*)    Total Bilirubin <0.2 (*)    GFR calc non Af Amer 59 (*)    GFR calc Af Amer 68 (*)    All other components within normal limits  URINE MICROSCOPIC-ADD ON    Imaging Review US Abdomen Complete  08/25/2013   CLINICAL DATA:  Right upper quadrant pain  EXAM: ULTRASOUND ABDOMEN COMPLETE   COMPARISON:  11/23/2008  FINDINGS: Gallbladder:  No gallstones or wall thickening visualized. No sonographic Murphy sign noted.  Common bile duct:  Diameter: 5.8 mm.  Liver:  The liver is diffusely increased in echogenicity consistent with fatty infiltration. No mass lesion is noted.  IVC:  No abnormality visualized.  Pancreas:  Visualized portion unremarkable.  Spleen:  Size and appearance within normal limits.  Right Kidney:  Length: 13 cm. Echogenicity within normal limits. No mass or  hydronephrosis visualized.  Left Kidney:  Length: 9.4 cm. The discrepant length may be related to a poor acoustical window. No acute abnormality is noted.  Abdominal aorta:  No aneurysm visualized.  Other findings:  None.  IMPRESSION: No acute abnormality is noted.   Electronically Signed   By: Inez Catalina M.D.   On: 08/25/2013 00:19     EKG Interpretation None      MDM   Final diagnoses:  Flank pain    Nonspecific flank pain with negative EGD evaluation. Doubt urolithiasis with obstructing kidney stone, urinary tract infection, gallbladder disease, colitis or enteritis. The findings on evaluation, are reassuring, and there is no indication for further evaluation at this time.  Nursing Notes Reviewed/ Care Coordinated Applicable Imaging Reviewed Interpretation of Laboratory Data incorporated into ED treatment  The patient appears reasonably screened and/or stabilized for discharge and I doubt any other medical condition or other [facility] requiring further screening, evaluation, or treatment in the ED at this time prior to discharge.  Plan: Home Medications- OTC analgesia; Home Treatments- rest; return here if the recommended treatment, does not improve the symptoms; Recommended follow up- PCP prn  Richarda Blade, MD 08/25/13 [phone]

## 2013-08-24 NOTE — ED Notes (Signed)
Patient transported to Ultrasound 

## 2013-08-25 LAB — COMPREHENSIVE METABOLIC PANEL
ALT: 9 U/L (ref 0–35)
AST: 16 U/L (ref 0–37)
Albumin: 3.5 g/dL (ref 3.5–5.2)
Alkaline Phosphatase: 94 U/L (ref 39–117)
BUN: 10 mg/dL (ref 6–23)
CALCIUM: 9.2 mg/dL (ref 8.4–10.5)
CHLORIDE: 106 meq/L (ref 96–112)
CO2: 24 meq/L (ref 19–32)
CREATININE: 1.07 mg/dL (ref 0.50–1.10)
GFR, EST AFRICAN AMERICAN: 68 mL/min — AB (ref >90)
GFR, EST NON AFRICAN AMERICAN: 59 mL/min — AB (ref >90)
GLUCOSE: 114 mg/dL — AB (ref 70–99)
Potassium: 3.2 mEq/L — ABNORMAL LOW (ref 3.7–5.3)
Sodium: 144 mEq/L (ref 137–147)
Total Protein: 7.2 g/dL (ref 6.0–8.3)

## 2013-08-25 NOTE — Discharge Instructions (Signed)
Use Tylenol, or Motrin, for pain. Drink plenty of fluids. Return here if needed, for problems.     Flank Pain Flank pain refers to pain that is located on the side of the body between the upper abdomen and the back. The pain may occur over a short period of time (acute) or may be long-term or reoccurring (chronic). It may be mild or severe. Flank pain can be caused by many things. CAUSES  Some of the more common causes of flank pain include:  Muscle strains.   Muscle spasms.   A disease of your spine (vertebral disk disease).   A lung infection (pneumonia).   Fluid around your lungs (pulmonary edema).   A kidney infection.   Kidney stones.   A very painful skin rash caused by the chickenpox virus (shingles).   Gallbladder disease.  [place] care will depend on the cause of your pain. In general,  Rest as directed by your caregiver.  Drink enough fluids to keep your urine clear or pale yellow.  Only take over-the-counter or prescription medicines as directed by your caregiver. Some medicines may help relieve the pain.  Tell your caregiver about any changes in your pain.  Follow up with your caregiver as directed. SEEK IMMEDIATE MEDICAL CARE IF:   Your pain is not controlled with medicine.   You have new or worsening symptoms.  Your pain increases.   You have abdominal pain.   You have shortness of breath.   You have persistent nausea or vomiting.   You have swelling in your abdomen.   You feel faint or pass out.   You have blood in your urine.  You have a fever or persistent symptoms for more than 2 3 days.  You have a fever and your symptoms suddenly get worse. MAKE SURE YOU:   Understand these instructions.  Will watch your condition.  Will get help right away if you are not doing well or get worse. Document Released: 05/23/2005 Document Revised: 12/25/2011 Document Reviewed: 11/14/2011 [facility] Patient  Information 2014 [place].

## 2013-11-22 ENCOUNTER — Other Ambulatory Visit: Payer: Self-pay | Admitting: Family Medicine

## 2013-11-22 ENCOUNTER — Ambulatory Visit
Admission: RE | Admit: 2013-11-22 | Discharge: 2013-11-22 | Disposition: A | Discharge status: Home / Self Care | Payer: Medicaid Other | Source: Ambulatory Visit | Attending: Family Medicine | Admitting: Family Medicine

## 2013-11-22 DIAGNOSIS — I10 Essential (primary) hypertension: Secondary | ICD-10-CM

## 2013-11-22 DIAGNOSIS — J410 Simple chronic bronchitis: Secondary | ICD-10-CM

## 2013-12-09 ENCOUNTER — Encounter: Payer: Self-pay | Admitting: Gastroenterology

## 2014-02-10 ENCOUNTER — Telehealth: Payer: Self-pay | Admitting: Gastroenterology

## 2014-02-10 ENCOUNTER — Ambulatory Visit: Payer: Medicaid Other | Admitting: Gastroenterology

## 2014-02-10 NOTE — Telephone Encounter (Signed)
No charge. 

## 2014-02-14 ENCOUNTER — Encounter: Payer: Medicaid Other | Admitting: Gastroenterology

## 2014-04-01 ENCOUNTER — Ambulatory Visit
Admission: RE | Admit: 2014-04-01 | Discharge: 2014-04-01 | Disposition: A | Discharge status: Home / Self Care | Payer: Medicaid Other | Source: Ambulatory Visit | Attending: Family Medicine | Admitting: Family Medicine

## 2014-04-01 ENCOUNTER — Other Ambulatory Visit: Payer: Self-pay | Admitting: Family Medicine

## 2014-04-01 DIAGNOSIS — J209 Acute bronchitis, unspecified: Secondary | ICD-10-CM

## 2014-04-07 ENCOUNTER — Inpatient Hospital Stay (HOSPITAL_COMMUNITY)
Admission: EM | Admit: 2014-04-07 | Discharge: 2014-04-12 | DRG: 811 | Disposition: A | Discharge status: Home / Self Care | Payer: Medicaid Other | Attending: Family Medicine | Admitting: Family Medicine

## 2014-04-07 ENCOUNTER — Encounter (HOSPITAL_COMMUNITY): Payer: Self-pay | Admitting: Emergency Medicine

## 2014-04-07 ENCOUNTER — Emergency Department (HOSPITAL_COMMUNITY): Payer: Medicaid Other

## 2014-04-07 DIAGNOSIS — Z79899 Other long term (current) drug therapy: Secondary | ICD-10-CM

## 2014-04-07 DIAGNOSIS — R079 Chest pain, unspecified: Secondary | ICD-10-CM

## 2014-04-07 DIAGNOSIS — F419 Anxiety disorder, unspecified: Secondary | ICD-10-CM | POA: Diagnosis present

## 2014-04-07 DIAGNOSIS — F17210 Nicotine dependence, cigarettes, uncomplicated: Secondary | ICD-10-CM | POA: Diagnosis present

## 2014-04-07 DIAGNOSIS — Z23 Encounter for immunization: Secondary | ICD-10-CM | POA: Diagnosis not present

## 2014-04-07 DIAGNOSIS — D72829 Elevated white blood cell count, unspecified: Secondary | ICD-10-CM | POA: Diagnosis present

## 2014-04-07 DIAGNOSIS — R5383 Other fatigue: Secondary | ICD-10-CM | POA: Diagnosis present

## 2014-04-07 DIAGNOSIS — M13862 Other specified arthritis, left knee: Secondary | ICD-10-CM | POA: Diagnosis present

## 2014-04-07 DIAGNOSIS — K5521 Angiodysplasia of colon with hemorrhage: Secondary | ICD-10-CM | POA: Diagnosis present

## 2014-04-07 DIAGNOSIS — Z91018 Allergy to other foods: Secondary | ICD-10-CM

## 2014-04-07 DIAGNOSIS — Z90710 Acquired absence of both cervix and uterus: Secondary | ICD-10-CM

## 2014-04-07 DIAGNOSIS — D500 Iron deficiency anemia secondary to blood loss (chronic): Secondary | ICD-10-CM | POA: Diagnosis not present

## 2014-04-07 DIAGNOSIS — R0602 Shortness of breath: Secondary | ICD-10-CM | POA: Diagnosis present

## 2014-04-07 DIAGNOSIS — R0600 Dyspnea, unspecified: Secondary | ICD-10-CM | POA: Diagnosis present

## 2014-04-07 DIAGNOSIS — M13861 Other specified arthritis, right knee: Secondary | ICD-10-CM | POA: Diagnosis present

## 2014-04-07 DIAGNOSIS — I10 Essential (primary) hypertension: Secondary | ICD-10-CM | POA: Diagnosis present

## 2014-04-07 DIAGNOSIS — M4690 Unspecified inflammatory spondylopathy, site unspecified: Secondary | ICD-10-CM | POA: Diagnosis present

## 2014-04-07 DIAGNOSIS — E876 Hypokalemia: Secondary | ICD-10-CM | POA: Diagnosis present

## 2014-04-07 DIAGNOSIS — D62 Acute posthemorrhagic anemia: Secondary | ICD-10-CM | POA: Diagnosis present

## 2014-04-07 DIAGNOSIS — K449 Diaphragmatic hernia without obstruction or gangrene: Secondary | ICD-10-CM | POA: Diagnosis present

## 2014-04-07 DIAGNOSIS — K921 Melena: Secondary | ICD-10-CM | POA: Diagnosis present

## 2014-04-07 DIAGNOSIS — D649 Anemia, unspecified: Secondary | ICD-10-CM

## 2014-04-07 LAB — CBC WITH DIFFERENTIAL/PLATELET
Basophils Absolute: 0 10*3/uL (ref 0.0–0.1)
Basophils Relative: 0 % (ref 0–1)
EOS PCT: 1 % (ref 0–5)
Eosinophils Absolute: 0.1 10*3/uL (ref 0.0–0.7)
HCT: 15.3 % — ABNORMAL LOW (ref 36.0–46.0)
Hemoglobin: 4.4 g/dL — CL (ref 12.0–15.0)
Lymphocytes Relative: 26 % (ref 12–46)
Lymphs Abs: 3.4 10*3/uL (ref 0.7–4.0)
MCH: 20.7 pg — ABNORMAL LOW (ref 26.0–34.0)
MCHC: 28.8 g/dL — ABNORMAL LOW (ref 30.0–36.0)
MCV: 71.8 fL — ABNORMAL LOW (ref 78.0–100.0)
Monocytes Absolute: 0.4 10*3/uL (ref 0.1–1.0)
Monocytes Relative: 3 % (ref 3–12)
Neutro Abs: 9.1 10*3/uL — ABNORMAL HIGH (ref 1.7–7.7)
Neutrophils Relative %: 70 % (ref 43–77)
Platelets: 422 10*3/uL — ABNORMAL HIGH (ref 150–400)
RBC: 2.13 MIL/uL — AB (ref 3.87–5.11)
RDW: 21.5 % — ABNORMAL HIGH (ref 11.5–15.5)
WBC: 13 10*3/uL — ABNORMAL HIGH (ref 4.0–10.5)

## 2014-04-07 LAB — CBC
HEMATOCRIT: 15.9 % — AB (ref 36.0–46.0)
HEMOGLOBIN: 4.6 g/dL — AB (ref 12.0–15.0)
MCH: 20.8 pg — ABNORMAL LOW (ref 26.0–34.0)
MCHC: 28.9 g/dL — ABNORMAL LOW (ref 30.0–36.0)
MCV: 71.9 fL — AB (ref 78.0–100.0)
PLATELETS: 466 10*3/uL — AB (ref 150–400)
RBC: 2.21 MIL/uL — AB (ref 3.87–5.11)
RDW: 21.5 % — ABNORMAL HIGH (ref 11.5–15.5)
WBC: 15.5 10*3/uL — AB (ref 4.0–10.5)

## 2014-04-07 LAB — BASIC METABOLIC PANEL
Anion gap: 10 (ref 5–15)
BUN: 10 mg/dL (ref 6–23)
CHLORIDE: 107 meq/L (ref 96–112)
CO2: 23 mmol/L (ref 19–32)
CREATININE: 1.08 mg/dL (ref 0.50–1.10)
Calcium: 9 mg/dL (ref 8.4–10.5)
GFR calc Af Amer: 67 mL/min — ABNORMAL LOW (ref >90)
GFR calc non Af Amer: 58 mL/min — ABNORMAL LOW (ref >90)
GLUCOSE: 128 mg/dL — AB (ref 70–99)
Potassium: 3.4 mmol/L — ABNORMAL LOW (ref 3.5–5.1)
Sodium: 140 mmol/L (ref 135–145)

## 2014-04-07 LAB — PROTIME-INR
INR: 1.14 (ref 0.00–1.49)
PROTHROMBIN TIME: 14.7 s (ref 11.6–15.2)

## 2014-04-07 LAB — I-STAT TROPONIN, ED: Troponin i, poc: 0.01 ng/mL (ref 0.00–0.08)

## 2014-04-07 LAB — BRAIN NATRIURETIC PEPTIDE: B NATRIURETIC PEPTIDE 5: 188.5 pg/mL — AB (ref 0.0–100.0)

## 2014-04-07 LAB — PREPARE RBC (CROSSMATCH)

## 2014-04-07 LAB — TROPONIN I: TROPONIN I: 0.06 ng/mL — AB (ref <0.031)

## 2014-04-07 LAB — POC OCCULT BLOOD, ED: Fecal Occult Bld: NEGATIVE

## 2014-04-07 MED ORDER — SODIUM CHLORIDE 0.9 % IV SOLN
10.0000 mL/h | Freq: Once | INTRAVENOUS | Status: AC
  Administered 2014-04-08: 10 mL/h via INTRAVENOUS

## 2014-04-07 NOTE — ED Notes (Addendum)
Pt reports continued sob all week. Was evaluated by pcp earlier this week. Pt reports fever/chills as well. resp clear. Pt also c/o sore throat and productive cough.

## 2014-04-07 NOTE — ED Notes (Signed)
MD at bedside. 

## 2014-04-07 NOTE — ED Notes (Signed)
Patient transported to X-ray 

## 2014-04-07 NOTE — ED Notes (Signed)
Patient returned from X-ray 

## 2014-04-07 NOTE — ED Provider Notes (Signed)
CSN: [id number]     Arrival date & time 04/07/14  2683 History   First MD Initiated Contact with Patient 04/07/14 1925     Chief Complaint  Patient presents with  . Shortness of Breath     (Consider location/radiation/quality/duration/timing/severity/associated sxs/prior Treatment) Patient is a 53 y.o. female presenting with shortness of breath.  Shortness of Breath Severity:  Moderate Onset quality:  Gradual Duration:  1 week Timing:  Constant Progression:  Worsening Chronicity:  New Context: activity   Relieved by:  Rest Worsened by:  Exertion and movement Ineffective treatments:  None tried Associated symptoms: cough and sore throat   Associated symptoms: no abdominal pain, no chest pain, no fever, no headaches, no neck pain, no rash, no syncope and no vomiting   Risk factors: tobacco use   Risk factors: no family hx of DVT, no hx of PE/DVT, no oral contraceptive use, no prolonged immobilization and no recent surgery     Past Medical History  Diagnosis Date  . Hypertension   . Anxiety   . History of swelling of feet   . Snores   . Arthritis     knnes,back   Past Surgical History  Procedure Laterality Date  . Abdominal hysterectomy    . L shoulder surgery  2011  . Carpal tunnel release  05/13/2011    Procedure: CARPAL TUNNEL RELEASE;  Surgeon: Nita Sells, MD;  Location: [place];  Service: Orthopedics;  Laterality: Left;  . Dilation and curettage of uterus    . Dg toes*l*  2/10    rt   Family History  Problem Relation Age of Onset  . Cancer Mother   . Ovarian cancer Mother   . Diabetes type II Sister   . Dysmenorrhea Neg Hx   . Diabetes Sister   . Colon cancer Maternal Grandfather   . Stomach cancer Maternal Grandmother    History  Substance Use Topics  . Smoking status: Current Every Day Smoker -- 0.50 packs/day for 30 years    Types: Cigarettes  . Smokeless tobacco: Never Used  . Alcohol Use: No   OB History    No data  available     Review of Systems  Constitutional: Positive for chills. Negative for fever.  HENT: Positive for congestion, nosebleeds and sore throat.   Eyes: Negative for visual disturbance.  Respiratory: Positive for cough and shortness of breath.   Cardiovascular: Negative for chest pain, leg swelling and syncope.  Gastrointestinal: Negative for nausea, vomiting, abdominal pain, diarrhea, blood in stool (had dark stool last week) and anal bleeding.  Genitourinary: Negative for hematuria and difficulty urinating.  Musculoskeletal: Negative for back pain and neck pain.  Skin: Negative for rash.  Neurological: Negative for syncope and headaches.  Hematological: Bruises/bleeds easily (hx of bleeding disorder "with a long name").      Allergies  Tomato  Home Medications   Prior to Admission medications   Medication Sig Start Date End Date Taking? Authorizing Provider  ALPRAZolam Duanne Moron) 1 MG tablet Take 1 mg by mouth every morning.    Yes Historical Provider, MD  amLODipine (NORVASC) 10 MG tablet Take 10 mg by mouth daily.   Yes Historical Provider, MD  Chlorphen-Phenyleph-ASA (ALKA-SELTZER PLUS COLD PO) Take 1 capsule by mouth 2 (two) times daily.   Yes Historical Provider, MD  Dextromethorphan-Guaifenesin (ROBITUSSIN DM) 10-100 MG/5ML liquid Take 5 mLs by mouth every 12 (twelve) hours.   Yes Historical Provider, MD  diphenhydrAMINE (BENADRYL) 25 MG tablet  Take 25 mg by mouth at bedtime as needed for allergies.    Yes Historical Provider, MD  furosemide (LASIX) 40 MG tablet Take 40 mg by mouth daily.   Yes Historical Provider, MD  Iron TABS Take 1 tablet by mouth daily.    Yes Historical Provider, MD  metoprolol (LOPRESSOR) 50 MG tablet Take 50 mg by mouth daily.   Yes Historical Provider, MD  pravastatin (PRAVACHOL) 40 MG tablet Take 40 mg by mouth daily.   Yes Historical Provider, MD  ibuprofen (ADVIL,MOTRIN) 200 MG tablet Take 400 mg by mouth 2 (two) times daily as needed (pain).     Historical Provider, MD  ibuprofen (ADVIL,MOTRIN) 800 MG tablet Take 800 mg by mouth at bedtime as needed (pain).    Historical Provider, MD   BP 129/65 mmHg  Pulse 69  Temp(Src) 99 F (37.2 C) (Oral)  Resp 18  SpO2 100% Physical Exam  Constitutional: She is oriented to person, place, and time. She appears well-developed and well-nourished. No distress.  HENT:  Head: Normocephalic and atraumatic.  Eyes: Conjunctivae and EOM are normal.  Neck: Normal range of motion.  Cardiovascular: Normal rate, regular rhythm, normal heart sounds and intact distal pulses.  Exam reveals no gallop and no friction rub.   No murmur heard. Pulmonary/Chest: Effort normal and breath sounds normal. No respiratory distress. She has no wheezes. She has no rales.  Abdominal: Soft. She exhibits no distension. There is no tenderness. There is no guarding.  Genitourinary: Rectal exam shows anal tone normal.  Musculoskeletal: She exhibits no edema or tenderness.  Neurological: She is alert and oriented to person, place, and time.  Skin: Skin is warm and dry. No rash noted. She is not diaphoretic. No erythema.  Nursing note and vitals reviewed.   ED Course  Procedures (including critical care time) Labs Review Labs Reviewed  CBC - Abnormal; Notable for the following:    WBC 15.5 (*)    RBC 2.21 (*)    Hemoglobin 4.6 (*)    HCT 15.9 (*)    MCV 71.9 (*)    MCH 20.8 (*)    MCHC 28.9 (*)    RDW 21.5 (*)    Platelets 466 (*)    All other components within normal limits  BASIC METABOLIC PANEL - Abnormal; Notable for the following:    Potassium 3.4 (*)    Glucose, Bld 128 (*)    GFR calc non Af Amer 58 (*)    GFR calc Af Amer 67 (*)    All other components within normal limits  BRAIN NATRIURETIC PEPTIDE - Abnormal; Notable for the following:    B Natriuretic Peptide 188.5 (*)    All other components within normal limits  TROPONIN I - Abnormal; Notable for the following:    Troponin I 0.06 (*)    All  other components within normal limits  CBC WITH DIFFERENTIAL - Abnormal; Notable for the following:    WBC 13.0 (*)    RBC 2.13 (*)    Hemoglobin 4.4 (*)    HCT 15.3 (*)    MCV 71.8 (*)    MCH 20.7 (*)    MCHC 28.8 (*)    RDW 21.5 (*)    Platelets 422 (*)    Neutro Abs 9.1 (*)    All other components within normal limits  PROTIME-INR  CBC WITH DIFFERENTIAL  APTT  PROTIME-INR  COMPREHENSIVE METABOLIC PANEL  RETICULOCYTES  APTT  FERRITIN  IRON AND TIBC  FOLATE  VITAMIN B12  LACTATE DEHYDROGENASE  I-STAT TROPOININ, ED  POC OCCULT BLOOD, ED  TYPE AND SCREEN  PREPARE RBC (CROSSMATCH)  DIRECT ANTIGLOBULIN TEST    Imaging Review Dg Chest 2 View  04/07/2014   CLINICAL DATA:  Pt has cough, weakness, and congestion for a month, always SOB but has been worsening over the last week. HX HTN, pneumonia, smoker x47yrs 0.5 ppd.  EXAM: CHEST  2 VIEW  COMPARISON:  04/01/2014 and 11/22/2013  FINDINGS: Lungs are hypoinflated without consolidation or effusion. Cardiomediastinal silhouette is within normal. There is mild spondylosis of the thoracic spine.  IMPRESSION: Hypoinflation without acute cardiopulmonary disease.   Electronically Signed   By: Marin Olp M.D.   On: 04/07/2014 20:18     EKG Interpretation   Date/Time:  Thursday April 07 2014 19:25:13 EST Ventricular Rate:  64 PR Interval:  180 QRS Duration: 80 QT Interval:  440 QTC Calculation: 453 R Axis:   -6 Text Interpretation:  Normal sinus rhythm Low voltage QRS Borderline ECG  No significant change since last tracing Confirmed by KNAPP  MD-J, JON  ([id number]) on 04/07/2014 7:30:21 PM      MDM   Final diagnoses:  Anemia Demand Ischemia Elevated troponin Shortness of breath    53 year old female with history of hypertension, bleeding disorder of unknown name presents with concern of one week of cough, congestion, and shortness of breath. Patient saw her PCP earlier this week and was prescribed an antibiotic  however her symptoms have continued to worsen.  Chest x-ray shows no signs of pneumonia. CBC significant for hemoglobin of 4.6.  Patient denies any vaginal bleeding, urinary bleeding. Does note a few dark stool last week however denies any recent. Reports several nosebleeds.  Hemoccult negative. 2 U of blood ordered.   Significant anemia likely the cause of patient's dyspnea. She does have a history of similar episode in 2009.  Troponin is mildly elevated at 0.06 which is likely secondary to demand and presence of severe anemia.  EKG shows no signs of acute ischemia, with low voltage and pt denies chest pain.  Patient hemodynamically stable and feel she is appropriate for telemetry.  She was admitted to the hospitalist for further care.     Alvino Chapel, MD 04/08/14 0130  Dorie Rank, MD 04/08/14 [phone]

## 2014-04-08 DIAGNOSIS — I10 Essential (primary) hypertension: Secondary | ICD-10-CM

## 2014-04-08 DIAGNOSIS — R0600 Dyspnea, unspecified: Secondary | ICD-10-CM | POA: Diagnosis present

## 2014-04-08 DIAGNOSIS — R7989 Other specified abnormal findings of blood chemistry: Secondary | ICD-10-CM

## 2014-04-08 DIAGNOSIS — I248 Other forms of acute ischemic heart disease: Secondary | ICD-10-CM | POA: Insufficient documentation

## 2014-04-08 DIAGNOSIS — D649 Anemia, unspecified: Secondary | ICD-10-CM

## 2014-04-08 DIAGNOSIS — R062 Wheezing: Secondary | ICD-10-CM

## 2014-04-08 DIAGNOSIS — R778 Other specified abnormalities of plasma proteins: Secondary | ICD-10-CM | POA: Insufficient documentation

## 2014-04-08 DIAGNOSIS — R0602 Shortness of breath: Secondary | ICD-10-CM

## 2014-04-08 DIAGNOSIS — I5032 Chronic diastolic (congestive) heart failure: Secondary | ICD-10-CM

## 2014-04-08 LAB — DIRECT ANTIGLOBULIN TEST (NOT AT ARMC)
DAT, IGG: NEGATIVE
DAT, complement: NEGATIVE

## 2014-04-08 LAB — GLUCOSE, CAPILLARY
GLUCOSE-CAPILLARY: 110 mg/dL — AB (ref 70–99)
Glucose-Capillary: 107 mg/dL — ABNORMAL HIGH (ref 70–99)
Glucose-Capillary: 155 mg/dL — ABNORMAL HIGH (ref 70–99)

## 2014-04-08 LAB — RETICULOCYTES
RBC.: 2.55 MIL/uL — ABNORMAL LOW (ref 3.87–5.11)
Retic Count, Absolute: 30.6 K/uL (ref 19.0–186.0)
Retic Ct Pct: 1.2 % (ref 0.4–3.1)

## 2014-04-08 LAB — CBC WITH DIFFERENTIAL/PLATELET
BASOS ABS: 0 10*3/uL (ref 0.0–0.1)
Basophils Relative: 0 % (ref 0–1)
Eosinophils Absolute: 0.1 10*3/uL (ref 0.0–0.7)
Eosinophils Relative: 1 % (ref 0–5)
HEMATOCRIT: 19.8 % — AB (ref 36.0–46.0)
Hemoglobin: 6.2 g/dL — CL (ref 12.0–15.0)
LYMPHS PCT: 27 % (ref 12–46)
Lymphs Abs: 3.3 10*3/uL (ref 0.7–4.0)
MCH: 24.3 pg — ABNORMAL LOW (ref 26.0–34.0)
MCHC: 31.3 g/dL (ref 30.0–36.0)
MCV: 77.6 fL — AB (ref 78.0–100.0)
MONO ABS: 0.4 10*3/uL (ref 0.1–1.0)
MONOS PCT: 3 % (ref 3–12)
Neutro Abs: 8.5 10*3/uL — ABNORMAL HIGH (ref 1.7–7.7)
Neutrophils Relative %: 69 % (ref 43–77)
Platelets: 377 10*3/uL (ref 150–400)
RBC: 2.55 MIL/uL — ABNORMAL LOW (ref 3.87–5.11)
RDW: 20.5 % — AB (ref 11.5–15.5)
WBC: 12.4 10*3/uL — AB (ref 4.0–10.5)

## 2014-04-08 LAB — COMPREHENSIVE METABOLIC PANEL WITH GFR
ALT: 17 U/L (ref 0–35)
AST: 17 U/L (ref 0–37)
Albumin: 3.3 g/dL — ABNORMAL LOW (ref 3.5–5.2)
Alkaline Phosphatase: 79 U/L (ref 39–117)
Anion gap: 9 (ref 5–15)
BUN: 10 mg/dL (ref 6–23)
CO2: 23 mmol/L (ref 19–32)
Calcium: 8.7 mg/dL (ref 8.4–10.5)
Chloride: 108 meq/L (ref 96–112)
Creatinine, Ser: 0.97 mg/dL (ref 0.50–1.10)
GFR calc Af Amer: 76 mL/min — ABNORMAL LOW
GFR calc non Af Amer: 66 mL/min — ABNORMAL LOW
Glucose, Bld: 93 mg/dL (ref 70–99)
Potassium: 3.3 mmol/L — ABNORMAL LOW (ref 3.5–5.1)
Sodium: 140 mmol/L (ref 135–145)
Total Bilirubin: 0.8 mg/dL (ref 0.3–1.2)
Total Protein: 6.5 g/dL (ref 6.0–8.3)

## 2014-04-08 LAB — CBC
HCT: 26 % — ABNORMAL LOW (ref 36.0–46.0)
HEMOGLOBIN: 8.2 g/dL — AB (ref 12.0–15.0)
MCH: 24.1 pg — AB (ref 26.0–34.0)
MCHC: 31.5 g/dL (ref 30.0–36.0)
MCV: 76.5 fL — ABNORMAL LOW (ref 78.0–100.0)
Platelets: 352 10*3/uL (ref 150–400)
RBC: 3.4 MIL/uL — ABNORMAL LOW (ref 3.87–5.11)
RDW: 19 % — ABNORMAL HIGH (ref 11.5–15.5)
WBC: 11.6 10*3/uL — ABNORMAL HIGH (ref 4.0–10.5)

## 2014-04-08 LAB — TROPONIN I: Troponin I: <0.03 ng/mL (ref <0.031)

## 2014-04-08 LAB — FERRITIN: Ferritin: 8 ng/mL — ABNORMAL LOW (ref 10–291)

## 2014-04-08 LAB — PREPARE RBC (CROSSMATCH)

## 2014-04-08 LAB — IRON AND TIBC
Iron: 65 ug/dL (ref 42–145)
Saturation Ratios: 20 % (ref 20–55)
TIBC: 333 ug/dL (ref 250–470)
UIBC: 268 ug/dL (ref 125–400)

## 2014-04-08 LAB — LACTATE DEHYDROGENASE: LDH: 160 U/L (ref 94–250)

## 2014-04-08 LAB — PROTIME-INR
INR: 1.24 (ref 0.00–1.49)
INR: 1.1 (ref 0.00–1.49)
Prothrombin Time: 15.7 s — ABNORMAL HIGH (ref 11.6–15.2)
Prothrombin Time: 14.4 seconds (ref 11.6–15.2)

## 2014-04-08 LAB — FOLATE: Folate: 10.7 ng/mL

## 2014-04-08 LAB — APTT: aPTT: 33 s (ref 24–37)

## 2014-04-08 LAB — VITAMIN B12: Vitamin B-12: 430 pg/mL (ref 211–911)

## 2014-04-08 MED ORDER — FUROSEMIDE 10 MG/ML IJ SOLN
20.0000 mg | Freq: Once | INTRAMUSCULAR | Status: AC
  Administered 2014-04-08: 20 mg via INTRAVENOUS
  Filled 2014-04-08: qty 2

## 2014-04-08 MED ORDER — ONDANSETRON HCL 4 MG/2ML IJ SOLN: 4.0000 mg | Freq: Four times a day (QID) | INTRAMUSCULAR | Status: DC | PRN

## 2014-04-08 MED ORDER — ALBUTEROL SULFATE (2.5 MG/3ML) 0.083% IN NEBU
2.5000 mg | INHALATION_SOLUTION | Freq: Four times a day (QID) | RESPIRATORY_TRACT | Status: DC
  Administered 2014-04-08: 2.5 mg via RESPIRATORY_TRACT
  Filled 2014-04-08 (×2): qty 3

## 2014-04-08 MED ORDER — ACETAMINOPHEN 325 MG PO TABS
650.0000 mg | ORAL_TABLET | Freq: Four times a day (QID) | ORAL | Status: DC | PRN
  Administered 2014-04-08 – 2014-04-10 (×3): 650 mg via ORAL
  Filled 2014-04-08 (×3): qty 2

## 2014-04-08 MED ORDER — MENTHOL 3 MG MT LOZG
1.0000 | LOZENGE | OROMUCOSAL | Status: DC | PRN
  Administered 2014-04-08: 3 mg via ORAL
  Filled 2014-04-08: qty 9

## 2014-04-08 MED ORDER — SODIUM CHLORIDE 0.9 % IV SOLN: Freq: Once | INTRAVENOUS | Status: DC

## 2014-04-08 MED ORDER — ONDANSETRON HCL 4 MG PO TABS: 4.0000 mg | ORAL_TABLET | Freq: Four times a day (QID) | ORAL | Status: DC | PRN

## 2014-04-08 MED ORDER — DIPHENHYDRAMINE HCL 25 MG PO CAPS: 25.0000 mg | ORAL_CAPSULE | Freq: Every evening | ORAL | Status: DC | PRN

## 2014-04-08 MED ORDER — ALPRAZOLAM 0.5 MG PO TABS
1.0000 mg | ORAL_TABLET | Freq: Every morning | ORAL | Status: DC
  Administered 2014-04-08 – 2014-04-12 (×5): 1 mg via ORAL
  Filled 2014-04-08 (×5): qty 2

## 2014-04-08 MED ORDER — CETYLPYRIDINIUM CHLORIDE 0.05 % MT LIQD
7.0000 mL | Freq: Two times a day (BID) | OROMUCOSAL | Status: DC
  Administered 2014-04-08 – 2014-04-12 (×7): 7 mL via OROMUCOSAL

## 2014-04-08 MED ORDER — ACETAMINOPHEN 650 MG RE SUPP: 650.0000 mg | Freq: Four times a day (QID) | RECTAL | Status: DC | PRN

## 2014-04-08 MED ORDER — PNEUMOCOCCAL VAC POLYVALENT 25 MCG/0.5ML IJ INJ
0.5000 mL | INJECTION | INTRAMUSCULAR | Status: AC
  Administered 2014-04-11: 0.5 mL via INTRAMUSCULAR
  Filled 2014-04-08: qty 0.5

## 2014-04-08 MED ORDER — ALBUTEROL SULFATE (2.5 MG/3ML) 0.083% IN NEBU: 2.5000 mg | INHALATION_SOLUTION | RESPIRATORY_TRACT | Status: DC | PRN

## 2014-04-08 MED ORDER — PANTOPRAZOLE SODIUM 40 MG IV SOLR
40.0000 mg | Freq: Two times a day (BID) | INTRAVENOUS | Status: DC
  Administered 2014-04-08 – 2014-04-12 (×10): 40 mg via INTRAVENOUS
  Filled 2014-04-08 (×11): qty 40

## 2014-04-08 MED ORDER — SODIUM CHLORIDE 0.9 % IJ SOLN
3.0000 mL | Freq: Two times a day (BID) | INTRAMUSCULAR | Status: DC
  Administered 2014-04-08 – 2014-04-12 (×9): 3 mL via INTRAVENOUS

## 2014-04-08 MED ORDER — INFLUENZA VAC SPLIT QUAD 0.5 ML IM SUSY
0.5000 mL | PREFILLED_SYRINGE | INTRAMUSCULAR | Status: AC
  Administered 2014-04-11: 0.5 mL via INTRAMUSCULAR
  Filled 2014-04-08 (×2): qty 0.5

## 2014-04-08 NOTE — H&P (Addendum)
[facility] History and Physical  Patient: Peggy House  YIR:[id number]  DOB: 1960-09-16  DOS: the patient was seen and examined on 04/08/2014  PCP: Elizabeth Palau, MD   Chief Complaint: tiredness  HPI: Peggy House is a 53 y.o. female with Past medical history of hypertension, DUB s/p hysterectomy in 2009, anemia. The patient is presenting with complaints of shortness of breath and tiredness. Patient presents with complaints of generalized fatigue and shortness of breath that has been ongoing since last one week and progressively worsening that brought her to the hospital. 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 She was prescribed an antibiotic that she completed 6 days course of treatment. Although she does not know the name of the antibiotic she mentions it was once a day. Before taking the antibiotic she did have an episode of diarrhea at which time she mentions that she did have some black color bowel movements. But since last one week after finishing of antibiotic she denies any black color bowel movement. She complains of some acid reflux on and off and mentions she has been taking some medications for that. She denies any blood in her urine or any coughing of blood. She mentions that she has history of epistaxis since her childhood and has been having nosebleed on and off occurring once or twice a month lasting for 1 hour. She also has a family history in her aunt of a bleeding disorder with similar nosebleed requiring surgeries. She denies any prior workup for any genetic or ENT cause for bleeding. In 2009 she presented with hemoglobin 5 at which time it was found to be dysfunctional uterine bleeding due to fibroid and she underwent a hysterectomy. She denies taking any blood thinner.  The patient is coming from home. And at her baseline independent for most of her ADL.  Review of  Systems: as mentioned in the history of present illness.  A Comprehensive review of the other systems is negative.  Past Medical History  Diagnosis Date  . Hypertension   . Anxiety   . History of swelling of feet   . Snores   . Arthritis     knnes,back   Past Surgical History  Procedure Laterality Date  . Abdominal hysterectomy    . L shoulder surgery  2011  . Carpal tunnel release  05/13/2011    Procedure: CARPAL TUNNEL RELEASE;  Surgeon: Nita Sells, MD;  Location: [place];  Service: Orthopedics;  Laterality: Left;  . Dilation and curettage of uterus    . Dg toes*l*  2/10    rt   Social History:  reports that she has been smoking Cigarettes.  She has a 15 pack-year smoking history. She has never used smokeless tobacco. She reports that she does not drink alcohol or use illicit drugs.  Allergies  Allergen Reactions  . Tomato Hives    Family History  Problem Relation Age of Onset  . Cancer Mother   . Ovarian cancer Mother   . Diabetes type II Sister   . Dysmenorrhea Neg Hx   . Diabetes Sister   . Colon cancer Maternal Grandfather   . Stomach cancer Maternal Grandmother     Prior to Admission medications   Medication Sig Start Date End Date Taking? Authorizing Provider  ALPRAZolam Duanne Moron) 1 MG tablet Take 1 mg by mouth every morning.  Yes Historical Provider, MD  amLODipine (NORVASC) 10 MG tablet Take 10 mg by mouth daily.   Yes Historical Provider, MD  Chlorphen-Phenyleph-ASA (ALKA-SELTZER PLUS COLD PO) Take 1 capsule by mouth 2 (two) times daily.   Yes Historical Provider, MD  Dextromethorphan-Guaifenesin (ROBITUSSIN DM) 10-100 MG/5ML liquid Take 5 mLs by mouth every 12 (twelve) hours.   Yes Historical Provider, MD  diphenhydrAMINE (BENADRYL) 25 MG tablet Take 25 mg by mouth at bedtime as needed for allergies.    Yes Historical Provider, MD  furosemide (LASIX) 40 MG tablet Take 40 mg by mouth daily.   Yes Historical Provider, MD  Iron  TABS Take 1 tablet by mouth daily.    Yes Historical Provider, MD  metoprolol (LOPRESSOR) 50 MG tablet Take 50 mg by mouth daily.   Yes Historical Provider, MD  pravastatin (PRAVACHOL) 40 MG tablet Take 40 mg by mouth daily.   Yes Historical Provider, MD  ibuprofen (ADVIL,MOTRIN) 200 MG tablet Take 400 mg by mouth 2 (two) times daily as needed (pain).    Historical Provider, MD  ibuprofen (ADVIL,MOTRIN) 800 MG tablet Take 800 mg by mouth at bedtime as needed (pain).    Historical Provider, MD    Physical Exam: Filed Vitals:   04/08/14 0036 04/08/14 0059 04/08/14 0334 04/08/14 0451  BP: 130/61 129/65 139/77 135/71  Pulse: 60 69 64 64  Temp: 98.8 F (37.1 C) 99 F (37.2 C) 98.9 F (37.2 C) 98.5 F (36.9 C)  TempSrc: Oral Oral Oral Oral  Resp: 18 18 18 17   SpO2: 100% 100% 100% 100%    General: Alert, Awake and Oriented to Time, Place and Person. Appear in mild distress Eyes: PERRL ENT: Oral Mucosa clear moist. Neck: no JVD Cardiovascular: S1 and S2 Present, no Murmur, Peripheral Pulses Present Respiratory: Bilateral Air entry equal and Decreased, Clear to Auscultation, noCrackles, no wheezes Abdomen: Bowel Sound present, Soft and non tender Skin: no Rash Extremities: trace Pedal edema, no calf tenderness Neurologic: Grossly no focal neuro deficit.  Labs on Admission:  CBC:  Recent Labs Lab 04/07/14 1933 04/07/14 2045 04/08/14 0439  WBC 15.5* 13.0* 12.4*  NEUTROABS  --  9.1* 8.5*  HGB 4.6* 4.4* 6.2*  HCT 15.9* 15.3* 19.8*  MCV 71.9* 71.8* 77.6*  PLT 466* 422* 377    CMP     Component Value Date/Time   NA 140 04/08/2014 0439   K 3.3* 04/08/2014 0439   CL 108 04/08/2014 0439   CO2 23 04/08/2014 0439   GLUCOSE 93 04/08/2014 0439   BUN 10 04/08/2014 0439   CREATININE 0.97 04/08/2014 0439   CALCIUM 8.7 04/08/2014 0439   PROT 6.5 04/08/2014 0439   ALBUMIN 3.3* 04/08/2014 0439   AST 17 04/08/2014 0439   ALT 17 04/08/2014 0439   ALKPHOS 79 04/08/2014 0439    BILITOT 0.8 04/08/2014 0439   GFRNONAA 66* 04/08/2014 0439   GFRAA 76* 04/08/2014 0439    No results for input(s): LIPASE, AMYLASE in the last 168 hours. No results for input(s): AMMONIA in the last 168 hours.   Recent Labs Lab 04/07/14 1939  TROPONINI 0.06*   BNP (last 3 results) No results for input(s): PROBNP in the last 8760 hours.  Radiological Exams on Admission: Dg Chest 2 View  04/07/2014   CLINICAL DATA:  Pt has cough, weakness, and congestion for a month, always SOB but has been worsening over the last week. HX HTN, pneumonia, smoker x16yrs 0.5 ppd.  EXAM: CHEST  2 VIEW  COMPARISON:  04/01/2014 and 11/22/2013  FINDINGS: Lungs are hypoinflated without consolidation or effusion. Cardiomediastinal silhouette is within normal. There is mild spondylosis of the thoracic spine.  IMPRESSION: Hypoinflation without acute cardiopulmonary disease.   Electronically Signed   By: Marin Olp M.D.   On: 04/07/2014 20:18    EKG: Independently reviewed. normal sinus rhythm, nonspecific ST and T waves changes.  Assessment/Plan Principal Problem:   Anemia Active Problems:   Shortness of breath   1. Anemia The patient is presenting with complaints of generalized fatigue and shortness of breath. On further workup her chest x-ray does not show any evidence of pneumonia or congestion. She only appears to have upper respiratory occasional wheezing and no generalized wheezing. EKG does not show any evidence of acute ischemia. Troponin is negative. Her hemoglobin is found to be 4.4. This appears most likely the cause of patient's fatigue and shortness of breath. Although she denies any active bleeding at the time of my evaluation she complains of black color bowel movement 2 weeks ago as well as a on and off epistaxis ongoing since her childhood. At the time of my evaluation she does not have any active bleeding and therefore I would hold off on obtaining further consultation. Check her for  epistaxis as well as check her for any occult blood. Continue to transfuse her with 2 units of PRBC. She may require further blood transfusion. Expecting her hemoglobin to raise from 4.4-6.4 at least. Monitor her on telemetry closely. If she requires further blood transfusion I would also give her one 20 mg IV Lasix. Check anemia panel.  2. Upper respiratory wheezing. Use DuoNeb's as needed. Continue close monitoring at present does not feel any ongoing COPD exacerbation that requires any aggressive treatment.  3. Essential hypertension. Chronic diastolic dysfunction. Mild elevation of BNP  At present continuing with current management and holding her Lasix. As most likely she will require IV Lasix between or blood transfusion.  Advance goals of care discussion: full code   DVT Prophylaxis: mechanical compression device Nutrition: npo  Disposition: Admitted to inpatient in telemetry unit.  Author: Berle Mull, MD [facility] Pager: [phone]  If 7PM-7AM, please contact night-coverage [email or web address] Password [email or web address]

## 2014-04-08 NOTE — Progress Notes (Signed)
New Admission Note:  Arrival Method: Via stretcher with RN, transported with oxygen Mental Orientation:Alert& Orientedx4 Telemetry: placed on box 14, CCMD notified Assessment: Completed Skin: Dry and intact Pain: Denies any pain Tubes:n/a Safety Measures: Safety Fall Prevention Plan was given, discussed and signed. Admission: Completed [address] Orientation: Patient has been orientated to the room, unit and the staff. Family: None at bedside  Orders have been reviewed and implemented. Will continue to monitor the patient. Call light has been placed within reach and bed alarm has been activated.   Leandro Reasoner BSN, RN  Phone Number: [phone] [facility]

## 2014-04-08 NOTE — Progress Notes (Signed)
Patient presented with profound anemia of 4.4 only transfused 2 units overnight. Repeat cbc this am 6.2 and patient requiring further transfusion per guidelines.  Will transfuse 2 more units and monitor.  Reassess next am unless there is change in patient condition.  Ashur Glatfelter, [employer]

## 2014-04-09 LAB — CBC
HCT: 30.6 % — ABNORMAL LOW (ref 36.0–46.0)
Hemoglobin: 9.6 g/dL — ABNORMAL LOW (ref 12.0–15.0)
MCH: 24.3 pg — ABNORMAL LOW (ref 26.0–34.0)
MCHC: 31.4 g/dL (ref 30.0–36.0)
MCV: 77.5 fL — AB (ref 78.0–100.0)
PLATELETS: 396 10*3/uL (ref 150–400)
RBC: 3.95 MIL/uL (ref 3.87–5.11)
RDW: 20 % — AB (ref 11.5–15.5)
WBC: 11.7 10*3/uL — ABNORMAL HIGH (ref 4.0–10.5)

## 2014-04-09 NOTE — Progress Notes (Signed)
[facility] PROGRESS NOTE  Peggy House BTD:[id number] DOB: 1960/11/21 DOA: 04/07/2014 PCP: Elizabeth Palau, MD  Assessment/Plan:  Principal Problem:   Anemia - Etiology uncertain. Consulted GI given history of black stools - Monitor cbc's and transfuse should hgb be less than 8.0 - transfused 4 units of PRBC  Active Problems:   Shortness of breath - resolved after transfusion  Code Status: full Family Communication:  Discussed with family at bedside Disposition Plan: Pending recommendations from specialist   Consultants:  GI  Procedures:  none  Antibiotics:  none  HPI/Subjective: Pt has no new complaints.   Objective: Filed Vitals:   04/09/14 0936  BP: 141/71  Pulse: 60  Temp: 97.8 F (36.6 C)  Resp: 20    Intake/Output Summary (Last 24 hours) at 04/09/14 1553 Last data filed at 04/09/14 1321  Gross per 24 hour  Intake   1480 ml  Output      0 ml  Net   1480 ml   There were no vitals filed for this visit.  Exam:   General:  Pt in NAD, alert and awake  Cardiovascular: rrr, no mrg  Respiratory: CTA BL, no wheezes  Abdomen: soft, ND, NT  Musculoskeletal: no clubbing   Data Reviewed: Basic Metabolic Panel:  Recent Labs Lab 04/07/14 1933 04/08/14 0439  NA 140 140  K 3.4* 3.3*  CL 107 108  CO2 23 23  GLUCOSE 128* 93  BUN 10 10  CREATININE 1.08 0.97  CALCIUM 9.0 8.7   Liver Function Tests:  Recent Labs Lab 04/08/14 0439  AST 17  ALT 17  ALKPHOS 79  BILITOT 0.8  PROT 6.5  ALBUMIN 3.3*   No results for input(s): LIPASE, AMYLASE in the last 168 hours. No results for input(s): AMMONIA in the last 168 hours. CBC:  Recent Labs Lab 04/07/14 1933 04/07/14 2045 04/08/14 0439 04/08/14 2022  WBC 15.5* 13.0* 12.4* 11.6*  NEUTROABS  --  9.1* 8.5*  --   HGB 4.6* 4.4* 6.2* 8.2*  HCT 15.9* 15.3* 19.8* 26.0*  MCV 71.9* 71.8* 77.6* 76.5*  PLT 466* 422* 377 352   Cardiac Enzymes:  Recent Labs Lab 04/07/14 1939  04/08/14 0710  TROPONINI 0.06* <0.03   BNP (last 3 results) No results for input(s): PROBNP in the last 8760 hours. CBG:  Recent Labs Lab 04/08/14 1222 04/08/14 1638 04/08/14 1658  GLUCAP 107* 110* 155*    No results found for this or any previous visit (from the past 240 hour(s)).   Studies: Dg Chest 2 View  04/07/2014   CLINICAL DATA:  Pt has cough, weakness, and congestion for a month, always SOB but has been worsening over the last week. HX HTN, pneumonia, smoker x10yrs 0.5 ppd.  EXAM: CHEST  2 VIEW  COMPARISON:  04/01/2014 and 11/22/2013  FINDINGS: Lungs are hypoinflated without consolidation or effusion. Cardiomediastinal silhouette is within normal. There is mild spondylosis of the thoracic spine.  IMPRESSION: Hypoinflation without acute cardiopulmonary disease.   Electronically Signed   By: Marin Olp M.D.   On: 04/07/2014 20:18    Scheduled Meds: . sodium chloride   Intravenous Once  . sodium chloride   Intravenous Once  . ALPRAZolam  1 mg Oral q morning - 10a  . antiseptic oral rinse  7 mL Mouth Rinse BID  . Influenza vac split quadrivalent PF  0.5 mL Intramuscular Tomorrow-1000  . pantoprazole (PROTONIX) IV  40 mg Intravenous Q12H  . pneumococcal 23 valent vaccine  0.5 mL Intramuscular  Tomorrow-1000  . sodium chloride  3 mL Intravenous Q12H   Continuous Infusions:    Time spent: > 35 minutes    Velvet Bathe  [facility] Pager [phone]  If 7PM-7AM, please contact night-coverage at [email or web address], password [facility] 04/09/2014, 3:53 PM  LOS: 2 days

## 2014-04-09 NOTE — Progress Notes (Signed)
Nutrition Brief Note  Patient identified on the Malnutrition Screening Tool (MST) Report  Wt Readings from Last 15 Encounters:  08/24/13 267 lb 3.2 oz (121.201 kg)  01/01/12 250 lb (113.399 kg)  10/22/11 253 lb 3.2 oz (114.851 kg)  05/13/11 234 lb (106.142 kg)    Current diet order is clear liquids, patient is consuming approximately 100% of meals at this time. Labs and medications reviewed.   No recent wt change. No nutrition interventions warranted at this time. If nutrition issues arise, please consult RD.   Brynda Greathouse, MS RD LDN Clinical Inpatient Dietitian Weekend/After hours pager: [phone]

## 2014-04-09 NOTE — Consult Note (Signed)
Referring Provider: Dr. Wendee Beavers Primary Care Physician:  Elizabeth Palau, MD Primary Gastroenterologist:  Althia Forts  Reason for Consultation:  Melena  HPI: Peggy House is a 53 y.o. female with onset of black stools last week described as 3 loose black stools that occurred and one episode of black stools this week. Hgb 4.6 (9.3 in May 2015) on admit with a low MCV of 71.9. Occasional abdominal pain that will last about a minute and then resolve. Felt lightheaded one time last week as well. +diaphoresis prior to admit. Denies fevers/chills/hematochezia/hematemesis/vomiting. +Nausea. Hemodynamically stable on admit. S/P 4 U PRBCs and Hgb today is 9.6. Denies previous history of bleeding. Denies history of ulcers. Has never had an EGD or colonoscopy and states she was referred for a colonoscopy to be done next month by [facility] but denies seeing them in their office. Takes one Ibuprofen daily. Heme negative.  Past Medical History  Diagnosis Date  . Hypertension   . Anxiety   . History of swelling of feet   . Snores   . Arthritis     knees,back    Past Surgical History  Procedure Laterality Date  . Abdominal hysterectomy    . L shoulder surgery  2011  . Carpal tunnel release  05/13/2011    Procedure: CARPAL TUNNEL RELEASE;  Surgeon: Nita Sells, MD;  Location: [facility];  Service: Orthopedics;  Laterality: Left;  . Dilation and curettage of uterus    . Dg toes*l*  2/10    rt    Prior to Admission medications   Medication Sig Start Date End Date Taking? Authorizing Provider  ALPRAZolam Duanne Moron) 1 MG tablet Take 1 mg by mouth every morning.    Yes Historical Provider, MD  amLODipine (NORVASC) 10 MG tablet Take 10 mg by mouth daily.   Yes Historical Provider, MD  Chlorphen-Phenyleph-ASA (ALKA-SELTZER PLUS COLD PO) Take 1 capsule by mouth 2 (two) times daily.   Yes Historical Provider, MD  Dextromethorphan-Guaifenesin (ROBITUSSIN DM) 10-100 MG/5ML liquid Take  5 mLs by mouth every 12 (twelve) hours.   Yes Historical Provider, MD  diphenhydrAMINE (BENADRYL) 25 MG tablet Take 25 mg by mouth at bedtime as needed for allergies.    Yes Historical Provider, MD  furosemide (LASIX) 40 MG tablet Take 40 mg by mouth daily.   Yes Historical Provider, MD  Iron TABS Take 1 tablet by mouth daily.    Yes Historical Provider, MD  metoprolol (LOPRESSOR) 50 MG tablet Take 50 mg by mouth daily.   Yes Historical Provider, MD  pravastatin (PRAVACHOL) 40 MG tablet Take 40 mg by mouth daily.   Yes Historical Provider, MD  ibuprofen (ADVIL,MOTRIN) 200 MG tablet Take 400 mg by mouth 2 (two) times daily as needed (pain).    Historical Provider, MD  ibuprofen (ADVIL,MOTRIN) 800 MG tablet Take 800 mg by mouth at bedtime as needed (pain).    Historical Provider, MD    Scheduled Meds: . sodium chloride   Intravenous Once  . sodium chloride   Intravenous Once  . ALPRAZolam  1 mg Oral q morning - 10a  . antiseptic oral rinse  7 mL Mouth Rinse BID  . Influenza vac split quadrivalent PF  0.5 mL Intramuscular Tomorrow-1000  . pantoprazole (PROTONIX) IV  40 mg Intravenous Q12H  . pneumococcal 23 valent vaccine  0.5 mL Intramuscular Tomorrow-1000  . sodium chloride  3 mL Intravenous Q12H   Continuous Infusions:  PRN Meds:.acetaminophen **OR** acetaminophen, albuterol, diphenhydrAMINE, menthol-cetylpyridinium, ondansetron **OR** ondansetron (  ZOFRAN) IV  Allergies as of 04/07/2014 - Review Complete 04/07/2014  Allergen Reaction Noted  . Tomato Hives 01/01/2012    Family History  Problem Relation Age of Onset  . Cancer Mother   . Ovarian cancer Mother   . Diabetes type II Sister   . Dysmenorrhea Neg Hx   . Diabetes Sister   . Colon cancer Maternal Grandfather   . Stomach cancer Maternal Grandmother     History   Social History  . Marital Status: Single    Spouse Name: N/A    Number of Children: N/A  . Years of Education: N/A   Occupational History  . Not on file.    Social History Main Topics  . Smoking status: Current Every Day Smoker -- 0.50 packs/day for 30 years    Types: Cigarettes  . Smokeless tobacco: Never Used  . Alcohol Use: No  . Drug Use: No     Comment: last cocaine-2010  . Sexual Activity: Not on file   Other Topics Concern  . Not on file   Social History Narrative    Review of Systems: All negative from GI standpoint except as stated above in HPI.  Physical Exam: Vital signs: Filed Vitals:   04/09/14 1724  BP: 130/73  Pulse: 63  Temp: 98 F (36.7 C)  Resp: 20   Last BM Date: 04/06/14 General:   Obese, pleasant and cooperative in NAD HEENT: anicteric Neck: supple, nontender Lungs:  Clear throughout to auscultation.   No wheezes, crackles, or rhonchi. No acute distress. Heart:  Regular rate and rhythm; no murmurs, clicks, rubs,  or gallops. Abdomen: soft, nontender, nondistended, +BS  Rectal:  Deferred Ext: no edema Neuro: alert and oriented Skin: no rash  GI:  Lab Results:  Recent Labs  04/08/14 0439 04/08/14 2022 04/09/14 1715  WBC 12.4* 11.6* 11.7*  HGB 6.2* 8.2* 9.6*  HCT 19.8* 26.0* 30.6*  PLT 377 352 396   BMET  Recent Labs  04/07/14 1933 04/08/14 0439  NA 140 140  K 3.4* 3.3*  CL 107 108  CO2 23 23  GLUCOSE 128* 93  BUN 10 10  CREATININE 1.08 0.97  CALCIUM 9.0 8.7   LFT  Recent Labs  04/08/14 0439  PROT 6.5  ALBUMIN 3.3*  AST 17  ALT 17  ALKPHOS 79  BILITOT 0.8   PT/INR  Recent Labs  04/08/14 0439 04/08/14 2022  LABPROT 15.7* 14.4  INR 1.24 1.10     Studies/Results: Dg Chest 2 View  04/07/2014   CLINICAL DATA:  Pt has cough, weakness, and congestion for a month, always SOB but has been worsening over the last week. HX HTN, pneumonia, smoker x66yrs 0.5 ppd.  EXAM: CHEST  2 VIEW  COMPARISON:  04/01/2014 and 11/22/2013  FINDINGS: Lungs are hypoinflated without consolidation or effusion. Cardiomediastinal silhouette is within normal. There is mild spondylosis of the  thoracic spine.  IMPRESSION: Hypoinflation without acute cardiopulmonary disease.   Electronically Signed   By: Marin Olp M.D.   On: 04/07/2014 20:18    Impression/Plan:  53 yo with one week of melena and severe anemia (Hgb 4.6 on 04/07/14) concerning for a peptic ulcer. No evidence of ongoing bleeding. Continue Protonix IV Q 12 hours. NPO p MN. EGD tomorrow morning to further evaluate. If EGD negative for a source, then will need an inpt colonoscopy but if ulcer noted on EGD, then will need outpt colonoscopy next month. Risks/benefits of EGD were discussed and patient agrees to proceed.  LOS: 2 days   [place] C.  04/09/2014, 7:39 PM

## 2014-04-10 ENCOUNTER — Encounter (HOSPITAL_COMMUNITY)
Admission: EM | Disposition: A | Discharge status: Home / Self Care | Payer: Self-pay | Source: Home / Self Care | Attending: Family Medicine

## 2014-04-10 ENCOUNTER — Encounter (HOSPITAL_COMMUNITY): Payer: Self-pay | Admitting: *Deleted

## 2014-04-10 DIAGNOSIS — K922 Gastrointestinal hemorrhage, unspecified: Secondary | ICD-10-CM

## 2014-04-10 DIAGNOSIS — E876 Hypokalemia: Secondary | ICD-10-CM

## 2014-04-10 HISTORY — PX: ESOPHAGOGASTRODUODENOSCOPY: SHX5428

## 2014-04-10 LAB — CBC
HCT: 28.5 % — ABNORMAL LOW (ref 36.0–46.0)
HCT: 28.2 % — ABNORMAL LOW (ref 36.0–46.0)
HEMOGLOBIN: 9 g/dL — AB (ref 12.0–15.0)
Hemoglobin: 9 g/dL — ABNORMAL LOW (ref 12.0–15.0)
MCH: 24.9 pg — ABNORMAL LOW (ref 26.0–34.0)
MCH: 24.7 pg — ABNORMAL LOW (ref 26.0–34.0)
MCHC: 31.6 g/dL (ref 30.0–36.0)
MCHC: 31.9 g/dL (ref 30.0–36.0)
MCV: 78.7 fL (ref 78.0–100.0)
MCV: 77.3 fL — AB (ref 78.0–100.0)
Platelets: 351 10*3/uL (ref 150–400)
Platelets: 337 10*3/uL (ref 150–400)
RBC: 3.62 MIL/uL — ABNORMAL LOW (ref 3.87–5.11)
RBC: 3.65 MIL/uL — AB (ref 3.87–5.11)
RDW: 20.3 % — AB (ref 11.5–15.5)
RDW: 20.4 % — ABNORMAL HIGH (ref 11.5–15.5)
WBC: 10.8 10*3/uL — AB (ref 4.0–10.5)
WBC: 10.4 10*3/uL (ref 4.0–10.5)

## 2014-04-10 SURGERY — EGD (ESOPHAGOGASTRODUODENOSCOPY)
Anesthesia: Moderate Sedation

## 2014-04-10 MED ORDER — BUTAMBEN-TETRACAINE-BENZOCAINE 2-2-14 % EX AERO
INHALATION_SPRAY | CUTANEOUS | Status: DC | PRN
Start: 2014-04-10 — End: 2014-04-10
  Administered 2014-04-10: 2 via TOPICAL

## 2014-04-10 MED ORDER — MIDAZOLAM HCL 10 MG/2ML IJ SOLN
INTRAMUSCULAR | Status: DC | PRN
  Administered 2014-04-10 (×3): 2 mg via INTRAVENOUS

## 2014-04-10 MED ORDER — MIDAZOLAM HCL 5 MG/ML IJ SOLN
INTRAMUSCULAR | Status: AC
  Filled 2014-04-10: qty 2

## 2014-04-10 MED ORDER — PHENOL 1.4 % MT LIQD
1.0000 | OROMUCOSAL | Status: DC | PRN
  Administered 2014-04-10 (×2): 1 via OROMUCOSAL
  Filled 2014-04-10: qty 177

## 2014-04-10 MED ORDER — FERROUS SULFATE 325 (65 FE) MG PO TABS
325.0000 mg | ORAL_TABLET | Freq: Two times a day (BID) | ORAL | Status: DC
  Administered 2014-04-10 – 2014-04-12 (×4): 325 mg via ORAL
  Filled 2014-04-10 (×6): qty 1

## 2014-04-10 MED ORDER — AMLODIPINE BESYLATE 10 MG PO TABS
10.0000 mg | ORAL_TABLET | Freq: Every day | ORAL | Status: DC
  Administered 2014-04-10 – 2014-04-12 (×3): 10 mg via ORAL
  Filled 2014-04-10 (×3): qty 1

## 2014-04-10 MED ORDER — DIPHENHYDRAMINE HCL 50 MG/ML IJ SOLN
INTRAMUSCULAR | Status: AC
  Filled 2014-04-10: qty 1

## 2014-04-10 MED ORDER — METOPROLOL SUCCINATE ER 50 MG PO TB24
50.0000 mg | ORAL_TABLET | Freq: Every day | ORAL | Status: DC
  Administered 2014-04-10 – 2014-04-12 (×3): 50 mg via ORAL
  Filled 2014-04-10 (×3): qty 1

## 2014-04-10 MED ORDER — FENTANYL CITRATE 0.05 MG/ML IJ SOLN
INTRAMUSCULAR | Status: AC
  Filled 2014-04-10: qty 2

## 2014-04-10 MED ORDER — FENTANYL CITRATE 0.05 MG/ML IJ SOLN
INTRAMUSCULAR | Status: DC | PRN
  Administered 2014-04-10 (×3): 25 ug via INTRAVENOUS

## 2014-04-10 MED ORDER — POTASSIUM CHLORIDE CRYS ER 20 MEQ PO TBCR
40.0000 meq | EXTENDED_RELEASE_TABLET | Freq: Once | ORAL | Status: AC
  Administered 2014-04-10: 40 meq via ORAL
  Filled 2014-04-10: qty 2

## 2014-04-10 NOTE — Interval H&P Note (Signed)
History and Physical Interval Note:  04/10/2014 9:19 AM  Peggy House  has presented today for surgery, with the diagnosis of GI bleed, anemia  The various methods of treatment have been discussed with the patient and family. After consideration of risks, benefits and other options for treatment, the patient has consented to  Procedure(s): ESOPHAGOGASTRODUODENOSCOPY (EGD) (N/A) as a surgical intervention .  The patient's history has been reviewed, patient examined, no change in status, stable for surgery.  I have reviewed the patient's chart and labs.  Questions were answered to the patient's satisfaction.     [place] C.

## 2014-04-10 NOTE — Brief Op Note (Addendum)
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 AVMs likely source of melena and anemia. Sips of water and ice chips only today and advance as tolerated starting tomorrow if Hgb stable and no further bleeding. Severe HTN and Dr. Wendee Beavers updated and he will manage.

## 2014-04-10 NOTE — Progress Notes (Signed)
Pt had another episode of epistaxis. Pressure applied. Will continue to monitor. Velora Mediate

## 2014-04-10 NOTE — Progress Notes (Signed)
[facility] PROGRESS NOTE  Peggy House KXF:[id number] DOB: Jun 29, 1960 DOA: 04/07/2014 PCP: Elizabeth Palau, MD  Assessment/Plan:  Principal Problem:   Anemia - Etiology uncertain. Consulted GI given history of black stools - Monitor cbc's and transfuse should hgb be less than 8.0 - transfused 4 units of PRBC  Acute GI bleed - Secondary to arteriovenous malformations which GI has treated. Will advance diet next a.m. and if no worsening anemia we'll plan on discharging with 2 week follow-up with GI.  Active Problems:   Shortness of breath - resolved after transfusion  HTN - Not well controlled. Patient was on home medications -We'll continue home medication regimen and reassess  Leukocytosis -Most likely stress reaction, currently trending down off of antibiotics  Hypokalemia - Reassess next a.m. - We'll replace if still low next a.m.  Code Status: full Family Communication:  Discussed with family at bedside Disposition Plan: Pending recommendations from specialist   Consultants:  GI  Procedures:  none  Antibiotics:  none  HPI/Subjective: Pt has no new complaints.   Objective: Filed Vitals:   04/10/14 1034  BP: 154/84  Pulse: 65  Temp: 98.2 F (36.8 C)  Resp: 20    Intake/Output Summary (Last 24 hours) at 04/10/14 1455 Last data filed at 04/10/14 0858  Gross per 24 hour  Intake    240 ml  Output      0 ml  Net    240 ml   Filed Weights   04/09/14 2015  Weight: 120 kg (264 lb 8.8 oz)    Exam:   General:  Pt in NAD, alert and awake  Cardiovascular: rrr, no mrg  Respiratory: CTA BL, no wheezes  Abdomen: soft, ND, NT  Musculoskeletal: no clubbing   Data Reviewed: Basic Metabolic Panel:  Recent Labs Lab 04/07/14 1933 04/08/14 0439  NA 140 140  K 3.4* 3.3*  CL 107 108  CO2 23 23  GLUCOSE 128* 93  BUN 10 10  CREATININE 1.08 0.97  CALCIUM 9.0 8.7   Liver Function Tests:  Recent Labs Lab 04/08/14 0439  AST  17  ALT 17  ALKPHOS 79  BILITOT 0.8  PROT 6.5  ALBUMIN 3.3*   No results for input(s): LIPASE, AMYLASE in the last 168 hours. No results for input(s): AMMONIA in the last 168 hours. CBC:  Recent Labs Lab 04/07/14 2045 04/08/14 0439 04/08/14 2022 04/09/14 1715 04/10/14 0456  WBC 13.0* 12.4* 11.6* 11.7* 10.8*  NEUTROABS 9.1* 8.5*  --   --   --   HGB 4.4* 6.2* 8.2* 9.6* 9.0*  HCT 15.3* 19.8* 26.0* 30.6* 28.5*  MCV 71.8* 77.6* 76.5* 77.5* 78.7  PLT 422* 377 352 396 351   Cardiac Enzymes:  Recent Labs Lab 04/07/14 1939 04/08/14 0710  TROPONINI 0.06* <0.03   BNP (last 3 results) No results for input(s): PROBNP in the last 8760 hours. CBG:  Recent Labs Lab 04/08/14 1222 04/08/14 1638 04/08/14 1658  GLUCAP 107* 110* 155*    No results found for this or any previous visit (from the past 240 hour(s)).   Studies: No results found.  Scheduled Meds: . ALPRAZolam  1 mg Oral q morning - 10a  . amLODipine  10 mg Oral Daily  . antiseptic oral rinse  7 mL Mouth Rinse BID  . ferrous sulfate  325 mg Oral BID WC  . Influenza vac split quadrivalent PF  0.5 mL Intramuscular Tomorrow-1000  . metoprolol succinate  50 mg Oral Daily  . pantoprazole (PROTONIX) IV  40 mg Intravenous Q12H  . pneumococcal 23 valent vaccine  0.5 mL Intramuscular Tomorrow-1000  . sodium chloride  3 mL Intravenous Q12H   Continuous Infusions:    Time spent: > 35 minutes    Velvet Bathe  [facility] Pager [phone]  If 7PM-7AM, please contact night-coverage at [email or web address], password [facility] 04/10/2014, 2:55 PM  LOS: 3 days

## 2014-04-10 NOTE — Progress Notes (Signed)
Utilization Review Completed.  

## 2014-04-10 NOTE — Op Note (Signed)
[facility] [place], [id number]   ENDOSCOPY PROCEDURE REPORT  PATIENT: House, Peggy  MR#: [id number] BIRTHDATE: [birth year] , 35  yrs. old GENDER: female ENDOSCOPIST: Wilford Corner, MD REFERRED BY: PROCEDURE DATE:  2014/04/19 PROCEDURE:  EGD w/ control of bleeding ASA CLASS:     Class III INDICATIONS:  melena and iron deficiency anemia. MEDICATIONS: Fentanyl 75 mcg IV and Versed 6 mg IV TOPICAL ANESTHETIC: Cetacaine Spray  DESCRIPTION OF PROCEDURE: After the risks benefits and alternatives of the procedure were thoroughly explained, informed consent was obtained.  The    endoscope was introduced through the mouth and advanced to the second portion of the duodenum , Without limitations.  The instrument was slowly withdrawn as the mucosa was fully examined.    Esophagus was normal and GEJ noted to be 40 cm from the incisors. Multiple small nonbleeding AVMs seen in the body and fundus of the stomach that were fulgurated with argon plasma coagulation (APC). Duodenal bulb and 2nd portion normal in appearance. Retroflexed views revealed a small hiatal hernia.     The scope was then withdrawn from the patient and the procedure completed.  COMPLICATIONS: There were no immediate complications.  ENDOSCOPIC IMPRESSION:     Gastric AVMs - s/p APC (see above) Hiatal hernia  RECOMMENDATIONS:     Follow H/Hs; Avoid NSAIDs   eSigned:  Wilford Corner, MD 19-Apr-2014 10:39 AM    CC:  CPT CODES: ICD CODES:  The ICD and CPT codes recommended by this software are interpretations from the data that the clinical staff has captured with the software.  The verification of the translation of this report to the ICD and CPT codes and modifiers is the sole responsibility of the health care institution and practicing physician where this report was generated.  [employer] will not be held responsible for the validity of the ICD  and CPT codes included on this report.  AMA assumes no liability for data contained or not contained herein. CPT is a Designer, television/film set of the [employer].  PATIENT NAME:  Peggy House, Peggy House MR#: [id number]

## 2014-04-10 NOTE — H&P (View-Only) (Signed)
Referring Provider: Dr. Wendee Beavers Primary Care Physician:  Elizabeth Palau, MD Primary Gastroenterologist:  Althia Forts  Reason for Consultation:  Melena  HPI: Peggy House is a 53 y.o. female with onset of black stools last week described as 3 loose black stools that occurred and one episode of black stools this week. Hgb 4.6 (9.3 in May 2015) on admit with a low MCV of 71.9. Occasional abdominal pain that will last about a minute and then resolve. Felt lightheaded one time last week as well. +diaphoresis prior to admit. Denies fevers/chills/hematochezia/hematemesis/vomiting. +Nausea. Hemodynamically stable on admit. S/P 4 U PRBCs and Hgb today is 9.6. Denies previous history of bleeding. Denies history of ulcers. Has never had an EGD or colonoscopy and states she was referred for a colonoscopy to be done next month by [facility] but denies seeing them in their office. Takes one Ibuprofen daily. Heme negative.  Past Medical History  Diagnosis Date  . Hypertension   . Anxiety   . History of swelling of feet   . Snores   . Arthritis     knees,back    Past Surgical History  Procedure Laterality Date  . Abdominal hysterectomy    . L shoulder surgery  2011  . Carpal tunnel release  05/13/2011    Procedure: CARPAL TUNNEL RELEASE;  Surgeon: Nita Sells, MD;  Location: [facility];  Service: Orthopedics;  Laterality: Left;  . Dilation and curettage of uterus    . Dg toes*l*  2/10    rt    Prior to Admission medications   Medication Sig Start Date End Date Taking? Authorizing Provider  ALPRAZolam Duanne Moron) 1 MG tablet Take 1 mg by mouth every morning.    Yes Historical Provider, MD  amLODipine (NORVASC) 10 MG tablet Take 10 mg by mouth daily.   Yes Historical Provider, MD  Chlorphen-Phenyleph-ASA (ALKA-SELTZER PLUS COLD PO) Take 1 capsule by mouth 2 (two) times daily.   Yes Historical Provider, MD  Dextromethorphan-Guaifenesin (ROBITUSSIN DM) 10-100 MG/5ML liquid Take  5 mLs by mouth every 12 (twelve) hours.   Yes Historical Provider, MD  diphenhydrAMINE (BENADRYL) 25 MG tablet Take 25 mg by mouth at bedtime as needed for allergies.    Yes Historical Provider, MD  furosemide (LASIX) 40 MG tablet Take 40 mg by mouth daily.   Yes Historical Provider, MD  Iron TABS Take 1 tablet by mouth daily.    Yes Historical Provider, MD  metoprolol (LOPRESSOR) 50 MG tablet Take 50 mg by mouth daily.   Yes Historical Provider, MD  pravastatin (PRAVACHOL) 40 MG tablet Take 40 mg by mouth daily.   Yes Historical Provider, MD  ibuprofen (ADVIL,MOTRIN) 200 MG tablet Take 400 mg by mouth 2 (two) times daily as needed (pain).    Historical Provider, MD  ibuprofen (ADVIL,MOTRIN) 800 MG tablet Take 800 mg by mouth at bedtime as needed (pain).    Historical Provider, MD    Scheduled Meds: . sodium chloride   Intravenous Once  . sodium chloride   Intravenous Once  . ALPRAZolam  1 mg Oral q morning - 10a  . antiseptic oral rinse  7 mL Mouth Rinse BID  . Influenza vac split quadrivalent PF  0.5 mL Intramuscular Tomorrow-1000  . pantoprazole (PROTONIX) IV  40 mg Intravenous Q12H  . pneumococcal 23 valent vaccine  0.5 mL Intramuscular Tomorrow-1000  . sodium chloride  3 mL Intravenous Q12H   Continuous Infusions:  PRN Meds:.acetaminophen **OR** acetaminophen, albuterol, diphenhydrAMINE, menthol-cetylpyridinium, ondansetron **OR** ondansetron (  ZOFRAN) IV  Allergies as of 04/07/2014 - Review Complete 04/07/2014  Allergen Reaction Noted  . Tomato Hives 01/01/2012    Family History  Problem Relation Age of Onset  . Cancer Mother   . Ovarian cancer Mother   . Diabetes type II Sister   . Dysmenorrhea Neg Hx   . Diabetes Sister   . Colon cancer Maternal Grandfather   . Stomach cancer Maternal Grandmother     History   Social History  . Marital Status: Single    Spouse Name: N/A    Number of Children: N/A  . Years of Education: N/A   Occupational History  . Not on file.    Social History Main Topics  . Smoking status: Current Every Day Smoker -- 0.50 packs/day for 30 years    Types: Cigarettes  . Smokeless tobacco: Never Used  . Alcohol Use: No  . Drug Use: No     Comment: last cocaine-2010  . Sexual Activity: Not on file   Other Topics Concern  . Not on file   Social History Narrative    Review of Systems: All negative from GI standpoint except as stated above in HPI.  Physical Exam: Vital signs: Filed Vitals:   04/09/14 1724  BP: 130/73  Pulse: 63  Temp: 98 F (36.7 C)  Resp: 20   Last BM Date: 04/06/14 General:   Obese, pleasant and cooperative in NAD HEENT: anicteric Neck: supple, nontender Lungs:  Clear throughout to auscultation.   No wheezes, crackles, or rhonchi. No acute distress. Heart:  Regular rate and rhythm; no murmurs, clicks, rubs,  or gallops. Abdomen: soft, nontender, nondistended, +BS  Rectal:  Deferred Ext: no edema Neuro: alert and oriented Skin: no rash  GI:  Lab Results:  Recent Labs  04/08/14 0439 04/08/14 2022 04/09/14 1715  WBC 12.4* 11.6* 11.7*  HGB 6.2* 8.2* 9.6*  HCT 19.8* 26.0* 30.6*  PLT 377 352 396   BMET  Recent Labs  04/07/14 1933 04/08/14 0439  NA 140 140  K 3.4* 3.3*  CL 107 108  CO2 23 23  GLUCOSE 128* 93  BUN 10 10  CREATININE 1.08 0.97  CALCIUM 9.0 8.7   LFT  Recent Labs  04/08/14 0439  PROT 6.5  ALBUMIN 3.3*  AST 17  ALT 17  ALKPHOS 79  BILITOT 0.8   PT/INR  Recent Labs  04/08/14 0439 04/08/14 2022  LABPROT 15.7* 14.4  INR 1.24 1.10     Studies/Results: Dg Chest 2 View  04/07/2014   CLINICAL DATA:  Pt has cough, weakness, and congestion for a month, always SOB but has been worsening over the last week. HX HTN, pneumonia, smoker x53yrs 0.5 ppd.  EXAM: CHEST  2 VIEW  COMPARISON:  04/01/2014 and 11/22/2013  FINDINGS: Lungs are hypoinflated without consolidation or effusion. Cardiomediastinal silhouette is within normal. There is mild spondylosis of the  thoracic spine.  IMPRESSION: Hypoinflation without acute cardiopulmonary disease.   Electronically Signed   By: Marin Olp M.D.   On: 04/07/2014 20:18    Impression/Plan:  53 yo with one week of melena and severe anemia (Hgb 4.6 on 04/07/14) concerning for a peptic ulcer. No evidence of ongoing bleeding. Continue Protonix IV Q 12 hours. NPO p MN. EGD tomorrow morning to further evaluate. If EGD negative for a source, then will need an inpt colonoscopy but if ulcer noted on EGD, then will need outpt colonoscopy next month. Risks/benefits of EGD were discussed and patient agrees to proceed.  LOS: 2 days   [place] [address] C.  04/09/2014, 7:39 PM

## 2014-04-11 ENCOUNTER — Encounter (HOSPITAL_COMMUNITY): Payer: Self-pay | Admitting: Gastroenterology

## 2014-04-11 LAB — TYPE AND SCREEN
ABO/RH(D): A NEG
Antibody Screen: NEGATIVE
UNIT DIVISION: 0
Unit division: 0
Unit division: 0
Unit division: 0
Unit division: 0
Unit division: 0

## 2014-04-11 LAB — CBC
HCT: 28.9 % — ABNORMAL LOW (ref 36.0–46.0)
Hemoglobin: 9.1 g/dL — ABNORMAL LOW (ref 12.0–15.0)
MCH: 24.4 pg — ABNORMAL LOW (ref 26.0–34.0)
MCHC: 31.5 g/dL (ref 30.0–36.0)
MCV: 77.5 fL — ABNORMAL LOW (ref 78.0–100.0)
PLATELETS: 332 10*3/uL (ref 150–400)
RBC: 3.73 MIL/uL — ABNORMAL LOW (ref 3.87–5.11)
RDW: 20.8 % — AB (ref 11.5–15.5)
WBC: 11.1 10*3/uL — ABNORMAL HIGH (ref 4.0–10.5)

## 2014-04-11 LAB — BASIC METABOLIC PANEL
Anion gap: 8 (ref 5–15)
CO2: 25 mmol/L (ref 19–32)
Calcium: 9.4 mg/dL (ref 8.4–10.5)
Chloride: 105 mEq/L (ref 96–112)
Creatinine, Ser: 0.95 mg/dL (ref 0.50–1.10)
GFR calc Af Amer: 78 mL/min — ABNORMAL LOW (ref >90)
GFR, EST NON AFRICAN AMERICAN: 67 mL/min — AB (ref >90)
Glucose, Bld: 95 mg/dL (ref 70–99)
POTASSIUM: 4 mmol/L (ref 3.5–5.1)
SODIUM: 138 mmol/L (ref 135–145)

## 2014-04-11 NOTE — Plan of Care (Signed)
Problem: Phase II Progression Outcomes Goal: Progress activity as tolerated unless otherwise ordered Outcome: Progressing Patient is x1 assist. Weakness noted.  Goal: Vital signs remain stable Outcome: Progressing HTN addressed by Hillary Bow MD.

## 2014-04-11 NOTE — Progress Notes (Signed)
[facility] PROGRESS NOTE  Peggy House GNF:[id number] DOB: 01/27/1961 DOA: 04/07/2014 PCP: Elizabeth Palau, MD  Assessment/Plan:  Principal Problem:   Anemia - Etiology uncertain. Consulted GI given history of black stools - Monitor cbc's and transfuse should hgb be less than 8.0 - transfused 4 units of PRBC - GI would like to observe for one more day.  Acute GI bleed - Secondary to arteriovenous malformations which GI has treated.  if no worsening anemia we'll plan on discharging with 2 week follow-up with GI.  Active Problems:   Shortness of breath - resolved after transfusion  HTN - Not well controlled. Patient was on home medications -We'll continue home medication regimen and reassess  Leukocytosis -Most likely stress reaction, currently trending down off of antibiotics  Hypokalemia - Reassess next a.m. - We'll replace if still low next a.m.  Code Status: full Family Communication:  Discussed with family at bedside Disposition Plan: Pending recommendations from specialist   Consultants:  GI  Procedures:  none  Antibiotics:  none  HPI/Subjective: Pt has no new complaints.   Objective: Filed Vitals:   04/11/14 1055  BP:   Pulse: 62  Temp:   Resp:    No intake or output data in the 24 hours ending 04/11/14 1550 Filed Weights   04/09/14 2015 04/10/14 2017  Weight: 120 kg (264 lb 8.8 oz) 118.5 kg (261 lb 3.9 oz)    Exam:   General:  Pt in NAD, alert and awake  Cardiovascular: rrr, no mrg  Respiratory: CTA BL, no wheezes  Abdomen: soft, ND, NT  Musculoskeletal: no clubbing   Data Reviewed: Basic Metabolic Panel:  Recent Labs Lab 04/07/14 1933 04/08/14 0439 04/11/14 0305  NA 140 140 138  K 3.4* 3.3* 4.0  CL 107 108 105  CO2 23 23 25   GLUCOSE 128* 93 95  BUN 10 10 <5*  CREATININE 1.08 0.97 0.95  CALCIUM 9.0 8.7 9.4   Liver Function Tests:  Recent Labs Lab 04/08/14 0439  AST 17  ALT 17  ALKPHOS 79   BILITOT 0.8  PROT 6.5  ALBUMIN 3.3*   No results for input(s): LIPASE, AMYLASE in the last 168 hours. No results for input(s): AMMONIA in the last 168 hours. CBC:  Recent Labs Lab 04/07/14 2045 04/08/14 0439 04/08/14 2022 04/09/14 1715 04/10/14 0456 04/10/14 1652 04/11/14 0305  WBC 13.0* 12.4* 11.6* 11.7* 10.8* 10.4 11.1*  NEUTROABS 9.1* 8.5*  --   --   --   --   --   HGB 4.4* 6.2* 8.2* 9.6* 9.0* 9.0* 9.1*  HCT 15.3* 19.8* 26.0* 30.6* 28.5* 28.2* 28.9*  MCV 71.8* 77.6* 76.5* 77.5* 78.7 77.3* 77.5*  PLT 422* 377 352 396 351 337 332   Cardiac Enzymes:  Recent Labs Lab 04/07/14 1939 04/08/14 0710  TROPONINI 0.06* <0.03   BNP (last 3 results) No results for input(s): PROBNP in the last 8760 hours. CBG:  Recent Labs Lab 04/08/14 1222 04/08/14 1638 04/08/14 1658  GLUCAP 107* 110* 155*    No results found for this or any previous visit (from the past 240 hour(s)).   Studies: No results found.  Scheduled Meds: . ALPRAZolam  1 mg Oral q morning - 10a  . amLODipine  10 mg Oral Daily  . antiseptic oral rinse  7 mL Mouth Rinse BID  . ferrous sulfate  325 mg Oral BID WC  . Influenza vac split quadrivalent PF  0.5 mL Intramuscular Tomorrow-1000  . metoprolol succinate  50 mg  Oral Daily  . pantoprazole (PROTONIX) IV  40 mg Intravenous Q12H  . sodium chloride  3 mL Intravenous Q12H   Continuous Infusions:    Time spent: > 35 minutes    Velvet Bathe  [facility] Pager [phone]  If 7PM-7AM, please contact night-coverage at [email or web address], password [facility] 04/11/2014, 3:50 PM  LOS: 4 days

## 2014-04-11 NOTE — Progress Notes (Signed)
[facility] PROGRESS NOTE Subjective No bleeding no AP  Objective: Vital signs in last 24 hours: Temp:  [97.5 F (36.4 C)-98.6 F (37 C)] 98.6 F (37 C) (12/28 0558) Pulse Rate:  [54-80] 55 (12/28 0558) Resp:  [13-26] 16 (12/28 0558) BP: (141-236)/(62-157) 141/73 mmHg (12/28 0558) SpO2:  [94 %-100 %] 95 % (12/28 0558) Weight:  [118.5 kg (261 lb 3.9 oz)] 118.5 kg (261 lb 3.9 oz) (12/27 2017) Last BM Date: 04/06/14  Intake/Output from previous day:   Intake/Output this shift:    PE: General--NAD Heart-- Lungs--clear Abdomen--SNT  Lab Results:  Recent Labs  04/08/14 2022 04/09/14 1715 04/10/14 0456 04/10/14 1652 04/11/14 0305  WBC 11.6* 11.7* 10.8* 10.4 11.1*  HGB 8.2* 9.6* 9.0* 9.0* 9.1*  HCT 26.0* 30.6* 28.5* 28.2* 28.9*  PLT 352 396 351 337 332   BMET  Recent Labs  04/11/14 0305  NA 138  K 4.0  CL 105  CO2 25  CREATININE 0.95   LFT No results for input(s): PROT, AST, ALT, ALKPHOS, BILITOT, BILIDIR, IBILI in the last 72 hours. PT/INR  Recent Labs  04/08/14 2022  LABPROT 14.4  INR 1.10   PANCREAS No results for input(s): LIPASE in the last 72 hours.       Studies/Results: No results found.  Medications: I have reviewed the patient's current medications.  Assessment/Plan: 1. GI Bleed. 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

## 2014-04-12 MED ORDER — PANTOPRAZOLE SODIUM 40 MG PO TBEC: 40.0000 mg | DELAYED_RELEASE_TABLET | Freq: Every day | ORAL | Status: DC

## 2014-04-12 MED ORDER — FERROUS SULFATE 325 (65 FE) MG PO TABS: 325.0000 mg | ORAL_TABLET | Freq: Two times a day (BID) | ORAL | Status: DC

## 2014-04-12 NOTE — Progress Notes (Signed)
[facility] PROGRESS NOTE Subjective no gross bleeding. Tolerating diet. Hemoglobin stable  Objective: Vital signs in last 24 hours: Temp:  [98.1 F (36.7 C)-98.7 F (37.1 C)] 98.7 F (37.1 C) (12/29 0523) Pulse Rate:  [58-62] 61 (12/29 0523) Resp:  [18] 18 (12/29 0523) BP: (132-134)/(66-70) 134/70 mmHg (12/29 0523) SpO2:  [99 %-100 %] 99 % (12/29 0523) Last BM Date: 04/11/14  Intake/Output from previous day:   Intake/Output this shift:    PE: General--no acute distress  Abdomen-- nontender  Lab Results:  Recent Labs  04/09/14 1715 04/10/14 0456 04/10/14 1652 04/11/14 0305  WBC 11.7* 10.8* 10.4 11.1*  HGB 9.6* 9.0* 9.0* 9.1*  HCT 30.6* 28.5* 28.2* 28.9*  PLT 396 351 337 332   BMET  Recent Labs  04/11/14 0305  NA 138  K 4.0  CL 105  CO2 25  CREATININE 0.95   LFT No results for input(s): PROT, AST, ALT, ALKPHOS, BILITOT, BILIDIR, IBILI in the last 72 hours. PT/INR No results for input(s): LABPROT, INR in the last 72 hours. PANCREAS No results for input(s): LIPASE in the last 72 hours.       Studies/Results: No results found.  Medications: I have reviewed the patient's current medications.  Assessment/Plan: 1. G.I. bleed. 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 No current bleeding and hemoglobin stable.  Would recommend sending home on oral protonix in a soft diet. Would have her follow-up in the office with Dr. Michail Sermon in 2 to 3 weeks.   Adajah Cocking JR,Kaytlen Lightsey L 04/12/2014, 9:48 AM

## 2014-04-12 NOTE — Care Management Note (Signed)
CARE MANAGEMENT NOTE 04/12/2014  Patient:  Peggy House, Peggy House   Account Number:  [id number]  Date Initiated:  04/12/2014  Documentation initiated by:  Atziry Baranski  Subjective/Objective Assessment:   CM following for progression and d/c planning.     Action/Plan:   Plan to d/c today , no HH needs identified.   Anticipated DC Date:  04/12/2014   Anticipated DC Plan:  HOME/SELF CARE         Choice offered to / List presented to:             Status of service:  Completed, signed off Medicare Important Message given?  NO (If response is "NO", the following Medicare IM given date fields will be blank) Date Medicare IM given:   Medicare IM given by:   Date Additional Medicare IM given:   Additional Medicare IM given by:    Discharge Disposition:  HOME/SELF CARE  Per UR Regulation:    If discussed at Long Length of Stay Meetings, dates discussed:    Comments:

## 2014-04-12 NOTE — Progress Notes (Signed)
Pt. Discharged home with husband. Discharge instructions given, pt verbalized signs and symptoms of worsening condition and when to call the doctor. Follow up appointments discussed. Prescriptions given.  Penni Bombard, RN 6:34 PM 04/12/2014

## 2014-04-12 NOTE — Discharge Summary (Signed)
Physician Discharge Summary  Peggy House KYH:[id number] DOB: 1961/04/07 DOA: 04/07/2014  PCP: Elizabeth Palau, MD  Admit date: 04/07/2014 Discharge date: 04/12/2014  Time spent: > 35 minutes  Recommendations for Outpatient Follow-up:  1. Please be sure to follow-up with GI specialist as listed below 2. Recommend reassessing hemoglobin levels  Discharge Diagnoses:  Principal Problem:   Anemia Active Problems:   Shortness of breath   Discharge Condition: stable  Diet recommendation: soft, bland diet  Filed Weights   04/09/14 2015 04/10/14 2017  Weight: 120 kg (264 lb 8.8 oz) 118.5 kg (261 lb 3.9 oz)    History of present illness:  Patient is a 53 year old with past medical history of hypertension, DUB status post hysterectomy in 2009, and anemia. Presented with profound anemia secondary to GI blood loss. Found to have AVMs on upper endoscopy.  Hospital Course:  Anemia secondary to GI blood loss - Secondary to AVMs noticed on endoscopy. AVMs treated by GI specialist -Patient's hemoglobin level stayed steady at 9 on day of discharge. -GI recommended soft bland diet and Protonix. -Patient to follow-up with GI in 2 weeks  Retention -Continue home medication regimen  Procedures:  Upper endoscopy as listed above  Consultations:  GI  Discharge Exam: Filed Vitals:   04/12/14 1000  BP: 133/68  Pulse: 59  Temp: 98.5 F (36.9 C)  Resp: 18    General: Pt in nad, alert and awake Cardiovascular: rrr, no mrg Respiratory: cta bl, no wheezes  Discharge Instructions   Discharge Instructions    Call MD for:  difficulty breathing, headache or visual disturbances    Complete by:  As directed      Call MD for:  severe uncontrolled pain    Complete by:  As directed      Call MD for:  temperature >100.4    Complete by:  As directed      Discharge instructions    Complete by:  As directed   Please follow-up with Dr. Michail Sermon in 2-3 weeks or sooner should any new  concerns arise     Increase activity slowly    Complete by:  As directed           Current Discharge Medication List    START taking these medications   Details  ferrous sulfate 325 (65 FE) MG tablet Take 1 tablet (325 mg total) by mouth 2 (two) times daily with a meal. Qty: 30 tablet, Refills: 0    pantoprazole (PROTONIX) 40 MG tablet Take 1 tablet (40 mg total) by mouth daily. Qty: 30 tablet, Refills: 0      CONTINUE these medications which have NOT CHANGED   Details  ALPRAZolam (XANAX) 1 MG tablet Take 1 mg by mouth every morning.     amLODipine (NORVASC) 10 MG tablet Take 10 mg by mouth daily.    diphenhydrAMINE (BENADRYL) 25 MG tablet Take 25 mg by mouth at bedtime as needed for allergies.     furosemide (LASIX) 40 MG tablet Take 40 mg by mouth daily.    metoprolol (LOPRESSOR) 50 MG tablet Take 50 mg by mouth daily.    pravastatin (PRAVACHOL) 40 MG tablet Take 40 mg by mouth daily.      STOP taking these medications     Chlorphen-Phenyleph-ASA (ALKA-SELTZER PLUS COLD PO)      Dextromethorphan-Guaifenesin (ROBITUSSIN DM) 10-100 MG/5ML liquid      Iron TABS      ibuprofen (ADVIL,MOTRIN) 200 MG tablet      ibuprofen (  ADVIL,MOTRIN) 800 MG tablet        Allergies  Allergen Reactions  . Tomato Hives      The results of significant diagnostics from this hospitalization (including imaging, microbiology, ancillary and laboratory) are listed below for reference.    Significant Diagnostic Studies: Dg Chest 2 View  04/07/2014   CLINICAL DATA:  Pt has cough, weakness, and congestion for a month, always SOB but has been worsening over the last week. HX HTN, pneumonia, smoker x59yrs 0.5 ppd.  EXAM: CHEST  2 VIEW  COMPARISON:  04/01/2014 and 11/22/2013  FINDINGS: Lungs are hypoinflated without consolidation or effusion. Cardiomediastinal silhouette is within normal. There is mild spondylosis of the thoracic spine.  IMPRESSION: Hypoinflation without acute  cardiopulmonary disease.   Electronically Signed   By: Marin Olp M.D.   On: 04/07/2014 20:18   Dg Chest 2 View  04/01/2014   CLINICAL DATA:  Acute bronchitis. Moderate to severe frequent episodes of shortness of Breath, cough, low-grade fever, and aches.  EXAM: CHEST  2 VIEW  COMPARISON:  Two-view lumbar spot scratch the two-view chest x-ray 11/22/2013  FINDINGS: The heart size is normal. The lungs are clear. Mild degenerative changes are present in the lower thoracic spine.  IMPRESSION: No acute cardiopulmonary disease or significant interval change.   Electronically Signed   By: Lawrence Santiago M.D.   On: 04/01/2014 15:56    Microbiology: No results found for this or any previous visit (from the past 240 hour(s)).   Labs: Basic Metabolic Panel:  Recent Labs Lab 04/07/14 1933 04/08/14 0439 04/11/14 0305  NA 140 140 138  K 3.4* 3.3* 4.0  CL 107 108 105  CO2 23 23 25   GLUCOSE 128* 93 95  BUN 10 10 <5*  CREATININE 1.08 0.97 0.95  CALCIUM 9.0 8.7 9.4   Liver Function Tests:  Recent Labs Lab 04/08/14 0439  AST 17  ALT 17  ALKPHOS 79  BILITOT 0.8  PROT 6.5  ALBUMIN 3.3*   No results for input(s): LIPASE, AMYLASE in the last 168 hours. No results for input(s): AMMONIA in the last 168 hours. CBC:  Recent Labs Lab 04/07/14 2045 04/08/14 0439 04/08/14 2022 04/09/14 1715 04/10/14 0456 04/10/14 1652 04/11/14 0305  WBC 13.0* 12.4* 11.6* 11.7* 10.8* 10.4 11.1*  NEUTROABS 9.1* 8.5*  --   --   --   --   --   HGB 4.4* 6.2* 8.2* 9.6* 9.0* 9.0* 9.1*  HCT 15.3* 19.8* 26.0* 30.6* 28.5* 28.2* 28.9*  MCV 71.8* 77.6* 76.5* 77.5* 78.7 77.3* 77.5*  PLT 422* 377 352 396 351 337 332   Cardiac Enzymes:  Recent Labs Lab 04/07/14 1939 04/08/14 0710  TROPONINI 0.06* <0.03   BNP: BNP (last 3 results) No results for input(s): PROBNP in the last 8760 hours. CBG:  Recent Labs Lab 04/08/14 1222 04/08/14 1638 04/08/14 1658  GLUCAP 107* 110* 155*       Signed:  Velvet Bathe  [facility] 04/12/2014, 3:02 PM

## 2014-04-20 ENCOUNTER — Ambulatory Visit: Payer: Medicaid Other | Admitting: Gastroenterology

## 2014-04-25 ENCOUNTER — Encounter (HOSPITAL_COMMUNITY): Payer: Self-pay | Admitting: Emergency Medicine

## 2014-04-25 ENCOUNTER — Inpatient Hospital Stay (HOSPITAL_COMMUNITY)
Admission: EM | Admit: 2014-04-25 | Discharge: 2014-04-28 | DRG: 327 | Disposition: A | Discharge status: Home / Self Care | Payer: Medicaid Other | Attending: Internal Medicine | Admitting: Internal Medicine

## 2014-04-25 DIAGNOSIS — F17210 Nicotine dependence, cigarettes, uncomplicated: Secondary | ICD-10-CM | POA: Diagnosis present

## 2014-04-25 DIAGNOSIS — K31811 Angiodysplasia of stomach and duodenum with bleeding: Secondary | ICD-10-CM | POA: Diagnosis not present

## 2014-04-25 DIAGNOSIS — Z6841 Body Mass Index (BMI) 40.0 and over, adult: Secondary | ICD-10-CM | POA: Diagnosis not present

## 2014-04-25 DIAGNOSIS — F419 Anxiety disorder, unspecified: Secondary | ICD-10-CM | POA: Diagnosis present

## 2014-04-25 DIAGNOSIS — D509 Iron deficiency anemia, unspecified: Secondary | ICD-10-CM | POA: Diagnosis present

## 2014-04-25 DIAGNOSIS — E66813 Obesity, class 3: Secondary | ICD-10-CM | POA: Diagnosis present

## 2014-04-25 DIAGNOSIS — E785 Hyperlipidemia, unspecified: Secondary | ICD-10-CM | POA: Diagnosis present

## 2014-04-25 DIAGNOSIS — D125 Benign neoplasm of sigmoid colon: Secondary | ICD-10-CM | POA: Diagnosis present

## 2014-04-25 DIAGNOSIS — K254 Chronic or unspecified gastric ulcer with hemorrhage: Secondary | ICD-10-CM | POA: Diagnosis present

## 2014-04-25 DIAGNOSIS — R110 Nausea: Secondary | ICD-10-CM | POA: Diagnosis not present

## 2014-04-25 DIAGNOSIS — I10 Essential (primary) hypertension: Secondary | ICD-10-CM | POA: Diagnosis present

## 2014-04-25 DIAGNOSIS — K922 Gastrointestinal hemorrhage, unspecified: Secondary | ICD-10-CM | POA: Insufficient documentation

## 2014-04-25 DIAGNOSIS — D124 Benign neoplasm of descending colon: Secondary | ICD-10-CM | POA: Diagnosis present

## 2014-04-25 DIAGNOSIS — K31819 Angiodysplasia of stomach and duodenum without bleeding: Secondary | ICD-10-CM

## 2014-04-25 DIAGNOSIS — K921 Melena: Secondary | ICD-10-CM

## 2014-04-25 DIAGNOSIS — M7989 Other specified soft tissue disorders: Secondary | ICD-10-CM | POA: Diagnosis present

## 2014-04-25 DIAGNOSIS — D62 Acute posthemorrhagic anemia: Secondary | ICD-10-CM | POA: Diagnosis present

## 2014-04-25 DIAGNOSIS — M1990 Unspecified osteoarthritis, unspecified site: Secondary | ICD-10-CM

## 2014-04-25 LAB — COMPREHENSIVE METABOLIC PANEL
ALBUMIN: 3.7 g/dL (ref 3.5–5.2)
ALK PHOS: 84 U/L (ref 39–117)
ALT: 12 U/L (ref 0–35)
AST: 19 U/L (ref 0–37)
Anion gap: 12 (ref 5–15)
BILIRUBIN TOTAL: 0.5 mg/dL (ref 0.3–1.2)
BUN: 9 mg/dL (ref 6–23)
CHLORIDE: 106 meq/L (ref 96–112)
CO2: 19 mmol/L (ref 19–32)
Calcium: 9.3 mg/dL (ref 8.4–10.5)
Creatinine, Ser: 1.11 mg/dL — ABNORMAL HIGH (ref 0.50–1.10)
GFR calc non Af Amer: 56 mL/min — ABNORMAL LOW (ref >90)
GFR, EST AFRICAN AMERICAN: 65 mL/min — AB (ref >90)
Glucose, Bld: 108 mg/dL — ABNORMAL HIGH (ref 70–99)
POTASSIUM: 3.5 mmol/L (ref 3.5–5.1)
Sodium: 137 mmol/L (ref 135–145)
Total Protein: 7.4 g/dL (ref 6.0–8.3)

## 2014-04-25 LAB — CBC WITH DIFFERENTIAL/PLATELET
Basophils Absolute: 0 10*3/uL (ref 0.0–0.1)
Basophils Relative: 0 % (ref 0–1)
Eosinophils Absolute: 0.1 10*3/uL (ref 0.0–0.7)
Eosinophils Relative: 1 % (ref 0–5)
HCT: 26 % — ABNORMAL LOW (ref 36.0–46.0)
HEMOGLOBIN: 7.8 g/dL — AB (ref 12.0–15.0)
Lymphocytes Relative: 21 % (ref 12–46)
Lymphs Abs: 1.8 10*3/uL (ref 0.7–4.0)
MCH: 23.9 pg — ABNORMAL LOW (ref 26.0–34.0)
MCHC: 30 g/dL (ref 30.0–36.0)
MCV: 79.8 fL (ref 78.0–100.0)
Monocytes Absolute: 0.3 10*3/uL (ref 0.1–1.0)
Monocytes Relative: 3 % (ref 3–12)
Neutro Abs: 6.6 10*3/uL (ref 1.7–7.7)
Neutrophils Relative %: 75 % (ref 43–77)
Platelets: 476 10*3/uL — ABNORMAL HIGH (ref 150–400)
RBC: 3.26 MIL/uL — ABNORMAL LOW (ref 3.87–5.11)
RDW: 20.9 % — ABNORMAL HIGH (ref 11.5–15.5)
WBC: 8.8 10*3/uL (ref 4.0–10.5)

## 2014-04-25 LAB — PROTIME-INR
INR: 1.15 (ref 0.00–1.49)
Prothrombin Time: 14.8 seconds (ref 11.6–15.2)

## 2014-04-25 LAB — SAMPLE TO BLOOD BANK

## 2014-04-25 MED ORDER — ONDANSETRON HCL 4 MG PO TABS: 4.0000 mg | ORAL_TABLET | Freq: Four times a day (QID) | ORAL | Status: DC | PRN

## 2014-04-25 MED ORDER — ACETAMINOPHEN 325 MG PO TABS: 650.0000 mg | ORAL_TABLET | Freq: Four times a day (QID) | ORAL | Status: DC | PRN

## 2014-04-25 MED ORDER — SODIUM CHLORIDE 0.9 % IJ SOLN
3.0000 mL | Freq: Two times a day (BID) | INTRAMUSCULAR | Status: DC
  Administered 2014-04-25 – 2014-04-26 (×2): 3 mL via INTRAVENOUS

## 2014-04-25 MED ORDER — GUAIFENESIN-DM 100-10 MG/5ML PO SYRP
5.0000 mL | ORAL_SOLUTION | ORAL | Status: DC | PRN
  Administered 2014-04-26 – 2014-04-27 (×2): 5 mL via ORAL
  Filled 2014-04-25 (×4): qty 5

## 2014-04-25 MED ORDER — PANTOPRAZOLE SODIUM 40 MG IV SOLR: 40.0000 mg | Freq: Two times a day (BID) | INTRAVENOUS | Status: DC

## 2014-04-25 MED ORDER — PANTOPRAZOLE SODIUM 40 MG IV SOLR
40.0000 mg | Freq: Two times a day (BID) | INTRAVENOUS | Status: DC
  Administered 2014-04-26 – 2014-04-27 (×4): 40 mg via INTRAVENOUS
  Filled 2014-04-25 (×6): qty 40

## 2014-04-25 MED ORDER — PRAVASTATIN SODIUM 40 MG PO TABS
40.0000 mg | ORAL_TABLET | Freq: Every day | ORAL | Status: DC
  Filled 2014-04-25: qty 1

## 2014-04-25 MED ORDER — METOPROLOL TARTRATE 50 MG PO TABS
50.0000 mg | ORAL_TABLET | Freq: Every day | ORAL | Status: DC
  Filled 2014-04-25: qty 1

## 2014-04-25 MED ORDER — ONDANSETRON HCL 4 MG/2ML IJ SOLN
4.0000 mg | Freq: Four times a day (QID) | INTRAMUSCULAR | Status: DC | PRN
  Administered 2014-04-27: 4 mg via INTRAVENOUS
  Filled 2014-04-25: qty 2

## 2014-04-25 MED ORDER — ALPRAZOLAM 0.5 MG PO TABS: 1.0000 mg | ORAL_TABLET | Freq: Every morning | ORAL | Status: DC

## 2014-04-25 MED ORDER — FERROUS SULFATE 325 (65 FE) MG PO TABS
325.0000 mg | ORAL_TABLET | Freq: Two times a day (BID) | ORAL | Status: DC
  Filled 2014-04-25 (×3): qty 1

## 2014-04-25 MED ORDER — PANTOPRAZOLE SODIUM 40 MG IV SOLR
40.0000 mg | INTRAVENOUS | Status: DC
  Administered 2014-04-25: 40 mg via INTRAVENOUS
  Filled 2014-04-25: qty 40

## 2014-04-25 NOTE — ED Notes (Signed)
Pt. reports bloody emesis x2 and black stool this morning , denies fever or chills, respirations unlabored.

## 2014-04-25 NOTE — ED Provider Notes (Signed)
CSN: [id number]     Arrival date & time 04/25/14  1952 History   First MD Initiated Contact with Patient 04/25/14 2056     Chief Complaint  Patient presents with  . Hematemesis  . Melena     (Consider location/radiation/quality/duration/timing/severity/associated sxs/prior Treatment) Patient is a 54 y.o. female presenting with GI illness.  GI Problem This is a recurrent problem. The current episode started today (4AM). The problem occurs rarely. The problem has been unchanged. Associated symptoms include congestion, coughing (mild), fatigue, nausea and vomiting (reports 2 episodes in a row of hematemesis, blood and brown, melena this AM. no melena for rest of day). Pertinent negatives include no abdominal pain, chest pain, fever, headaches, neck pain, rash or sore throat. Nothing aggravates the symptoms.    Past Medical History  Diagnosis Date  . Hypertension   . Anxiety   . History of swelling of feet   . Snores   . Arthritis     knnes,back   Past Surgical History  Procedure Laterality Date  . Abdominal hysterectomy    . L shoulder surgery  2011  . Carpal tunnel release  05/13/2011    Procedure: CARPAL TUNNEL RELEASE;  Surgeon: Nita Sells, MD;  Location: [facility];  Service: Orthopedics;  Laterality: Left;  . Dilation and curettage of uterus    . Dg toes*l*  2/10    rt  . Esophagogastroduodenoscopy N/A 04/10/2014    Procedure: ESOPHAGOGASTRODUODENOSCOPY (EGD);  Surgeon: [name], MD;  Location: [facility] ENDOSCOPY;  Service: Endoscopy;  Laterality: N/A;   Family History  Problem Relation Age of Onset  . Cancer Mother   . Ovarian cancer Mother   . Diabetes type II Sister   . Dysmenorrhea Neg Hx   . Diabetes Sister   . Colon cancer Maternal Grandfather   . Stomach cancer Maternal Grandmother    History  Substance Use Topics  . Smoking status: Current Every Day Smoker -- 0.50 packs/day for 30 years    Types: Cigarettes  . Smokeless  tobacco: Never Used  . Alcohol Use: No   OB History    No data available     Review of Systems  Constitutional: Positive for fatigue. Negative for fever.  HENT: Positive for congestion and rhinorrhea. Negative for sore throat.   Eyes: Negative for visual disturbance.  Respiratory: Positive for cough (mild). Negative for shortness of breath.   Cardiovascular: Negative for chest pain.  Gastrointestinal: Positive for nausea, vomiting (reports 2 episodes in a row of hematemesis, blood and brown, melena this AM. no melena for rest of day) and blood in stool. Negative for abdominal pain, diarrhea and constipation.  Genitourinary: Negative for difficulty urinating.  Musculoskeletal: Negative for back pain and neck pain.  Skin: Negative for rash.  Neurological: Negative for syncope and headaches.      Allergies  Tomato  Home Medications   Prior to Admission medications   Medication Sig Start Date End Date Taking? Authorizing Provider  ALPRAZolam Duanne Moron) 1 MG tablet Take 1 mg by mouth every morning.     Historical Provider, MD  amLODipine (NORVASC) 10 MG tablet Take 10 mg by mouth daily.    Historical Provider, MD  diphenhydrAMINE (BENADRYL) 25 MG tablet Take 25 mg by mouth at bedtime as needed for allergies.     Historical Provider, MD  ferrous sulfate 325 (65 FE) MG tablet Take 1 tablet (325 mg total) by mouth 2 (two) times daily with a meal. 04/12/14  Velvet Bathe, MD  furosemide (LASIX) 40 MG tablet Take 40 mg by mouth daily.    Historical Provider, MD  metoprolol (LOPRESSOR) 50 MG tablet Take 50 mg by mouth daily.    Historical Provider, MD  pantoprazole (PROTONIX) 40 MG tablet Take 1 tablet (40 mg total) by mouth daily. 04/12/14   Velvet Bathe, MD  pravastatin (PRAVACHOL) 40 MG tablet Take 40 mg by mouth daily.    Historical Provider, MD   Pulse 76  Temp(Src) 98.5 F (36.9 C) (Oral)  Resp 18  Ht 5\' 6"  (1.676 m)  Wt 260 lb (117.935 kg)  BMI 41.99 kg/m2  SpO2 100% Physical  Exam  Constitutional: She is oriented to person, place, and time. She appears well-developed and well-nourished. No distress.  HENT:  Head: Normocephalic and atraumatic.  Eyes: Conjunctivae and EOM are normal.  Neck: Normal range of motion.  Cardiovascular: Normal rate, regular rhythm, normal heart sounds and intact distal pulses.  Exam reveals no gallop and no friction rub.   No murmur heard. Pulmonary/Chest: Effort normal and breath sounds normal. No respiratory distress. She has no wheezes. She has no rales.  Abdominal: Soft. She exhibits no distension. There is no tenderness. There is no guarding.  Musculoskeletal: She exhibits no edema or tenderness.  Neurological: She is alert and oriented to person, place, and time.  Skin: Skin is warm and dry. No rash noted. She is not diaphoretic. No erythema.  Nursing note and vitals reviewed.   ED Course  Procedures (including critical care time) Labs Review Labs Reviewed  CBC WITH DIFFERENTIAL - Abnormal; Notable for the following:    RBC 3.26 (*)    Hemoglobin 7.8 (*)    HCT 26.0 (*)    MCH 23.9 (*)    RDW 20.9 (*)    Platelets 476 (*)    All other components within normal limits  COMPREHENSIVE METABOLIC PANEL - Abnormal; Notable for the following:    Glucose, Bld 108 (*)    Creatinine, Ser 1.11 (*)    GFR calc non Af Amer 56 (*)    GFR calc Af Amer 65 (*)    All other components within normal limits  PROTIME-INR  SAMPLE TO BLOOD BANK  TYPE AND SCREEN    Imaging Review No results found.   EKG Interpretation   Date/Time:  Monday April 25 2014 19:59:39 EST Ventricular Rate:  76 PR Interval:  168 QRS Duration: 72 QT Interval:  386 QTC Calculation: 434 R Axis:   -14 Text Interpretation:  Normal sinus rhythm Cannot rule out Anterior infarct  , age undetermined Abnormal ECG No significant change was found Confirmed  by [facility]  MD, TREY ([id number]) on 04/25/2014 9:12:10 PM      MDM   Final diagnoses:  None    54 year old female with a history of hypertension, hyperlipidemia, self-reported history of bleeding disorder of unknown name, recent admission for anemia secondary to gastric AVM bleed presents with concern of hematemesis and melena. Patient reports that she had 2 episodes at 4 AM this morning of hematemesis and one episode of melena and has not had any further episodes during the day. Her vital signs are stable with normal blood pressure and heart rate.  Her hemoglobin is decreased from day of discharge 1228 of 9.1 to 7.8 today.  Given she is not having persistent melena or hematemesis with normal vital signs feel she is appropriate for telemetry care.  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  Patient with benign abdominal exam.  She was admitted to the hospitalist for continued care.   Gareth Morgan, MD 04/26/14 0124  Arbie Cookey, MD 04/26/14 [phone]

## 2014-04-25 NOTE — Progress Notes (Signed)
New Admission Note  Arrival: stretcher Mental Orientation: alert and oriented x 4 Telemetry: 6E12 NSR Assessment: See flow sheet. IV: right hand/left hand x2 saline lock Safety Measures: Side rails in place, fall risk assessment complete with patient verbalizing understanding of risks associated with falls. Pt verbalizes an understanding of how to use the call bell and to call for help before getting out of bed. Call bell within reach, bed in lowest position, non-skid socks applied. 6 [place] Orientation: Pt orientation to unit, room and routine. Information packet given to patient and safety video watched.  Admission armband ID verified with patient and in place.  Family: n/a  Orders have been reviewed and implemented. Will continue to monitor and assist as needed.  Velora Mediate, RN

## 2014-04-25 NOTE — H&P (Addendum)
[facility] History and Physical  Peggy House MPN:[id number] DOB: 10-27-1960 DOA: 04/25/2014  Referring physician: ED physician PCP: Elizabeth Palau, MD  Specialists:   Chief Complaint: Melena and hematemesis  HPI: Peggy House is a 54 y.o. 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  Patient reports that at about 4:00 AM, she had one episode of hematemesis. She also had one episode of melena. No nausea, abdominal pain or diarrhea. She does not have chest pain or dizziness. She has mild chronic SOB which has not changed. She feels little tired. She reports that in the past 2 days, she has runny nose. She treated herself with Alka-Selter. She does not have fever, but has chills. No chest pain or cough. Of note, she was hospitalized from 12/24-12/29 because of upper GIB. She had EGD done in hospital, which showed AVM. She was treated by GI, and transfused with 4 units of blood. Her hemoglobin was 9.1 on 04/11/14. She was discharged on Protonix. She is supposed to follow-up with GI in 2 weeks, but has not been done so yet. She denies fever, headaches, cough, chest pain, abdominal pain, diarrhea, dysuria, urgency, frequency, hematuria, skin rashes, or leg swelling.  Work up in the ED demonstrates hemoglobin decreasing from 9.1 on 04/11/14 to 7.8 on admission. Patient is admitted to inpatient for further evaluation and treatment.   Review of Systems: As presented in the history of presenting illness, rest negative.  Where does patient live? At home  Can patient participate in ADLs? Yes  Allergy:  Allergies  Allergen Reactions  . Nsaids Other (See Comments)    Stomach bleeding episodes  . Tomato Hives    Past Medical History  Diagnosis Date  . Hypertension   . Anxiety   . History of swelling of feet   .  Snores   . Arthritis     knnes,back    Past Surgical History  Procedure Laterality Date  . Abdominal hysterectomy    . L shoulder surgery  2011  . Carpal tunnel release  05/13/2011    Procedure: CARPAL TUNNEL RELEASE;  Surgeon: Nita Sells, MD;  Location: [place];  Service: Orthopedics;  Laterality: Left;  . Dilation and curettage of uterus    . Dg toes*l*  2/10    rt  . Esophagogastroduodenoscopy N/A 04/10/2014    Procedure: ESOPHAGOGASTRODUODENOSCOPY (EGD);  Surgeon: [name], MD;  Location: [facility] ENDOSCOPY;  Service: Endoscopy;  Laterality: N/A;    Social History:  reports that she has been smoking Cigarettes.  She has a 15 pack-year smoking history. She has never used smokeless tobacco. She reports that she does not drink alcohol or use illicit drugs.  Family History:  Family History  Problem Relation Age of Onset  . Cancer Mother   . Ovarian cancer Mother   . Diabetes type II Sister   . Dysmenorrhea Neg Hx   . Diabetes Sister   . Colon cancer Maternal Grandfather   . Stomach cancer Maternal Grandmother      Prior to Admission medications   Medication Sig Start Date End Date Taking? Authorizing Provider  ALPRAZolam Duanne Moron) 1 MG tablet Take 1 mg by mouth every morning.     Historical Provider, MD  amLODipine (NORVASC) 10 MG tablet Take 10 mg by mouth daily.    Historical Provider, MD  diphenhydrAMINE (  BENADRYL) 25 MG tablet Take 25 mg by mouth at bedtime as needed for allergies.     Historical Provider, MD  ferrous sulfate 325 (65 FE) MG tablet Take 1 tablet (325 mg total) by mouth 2 (two) times daily with a meal. 04/12/14   Velvet Bathe, MD  furosemide (LASIX) 40 MG tablet Take 40 mg by mouth daily.    Historical Provider, MD  metoprolol (LOPRESSOR) 50 MG tablet Take 50 mg by mouth daily.    Historical Provider, MD  pantoprazole (PROTONIX) 40 MG tablet Take 1 tablet (40 mg total) by mouth daily. 04/12/14   Velvet Bathe, MD  pravastatin  (PRAVACHOL) 40 MG tablet Take 40 mg by mouth daily.    Historical Provider, MD    Physical Exam: Filed Vitals:   04/25/14 1956 04/25/14 2102  Pulse: 76   Temp: 98.5 F (36.9 C)   TempSrc: Oral   Resp: 18   Height: 5\' 6"  (1.676 m)   Weight: 117.935 kg (260 lb)   SpO2: 100% 100%   General: Not in acute distress HEENT:       Eyes: PERRL, EOMI, no scleral icterus       ENT: No discharge from the ears and nose, no pharynx injection, no tonsillar enlargement.        Neck: No JVD, no bruit, no mass felt. Cardiac: S1/S2, RRR, No murmurs, No gallops or rubs Pulm: Good air movement bilaterally. Clear to auscultation bilaterally. No rales, wheezing, rhonchi or rubs. Abd: Soft, nondistended, nontender, no rebound pain, no organomegaly, BS present Ext: No edema bilaterally. 2+DP/PT pulse bilaterally Musculoskeletal: No joint deformities, erythema, or stiffness, ROM full Skin: No rashes.  Neuro: Alert and oriented X3, cranial nerves II-XII grossly intact, muscle strength 5/5 in all extremeties, sensation to light touch intact.  Psych: Patient is not psychotic, no suicidal or hemocidal ideation.  Labs on Admission:  Basic Metabolic Panel:  Recent Labs Lab 04/25/14 2002  NA 137  K 3.5  CL 106  CO2 19  GLUCOSE 108*  BUN 9  CREATININE 1.11*  CALCIUM 9.3   Liver Function Tests:  Recent Labs Lab 04/25/14 2002  AST 19  ALT 12  ALKPHOS 84  BILITOT 0.5  PROT 7.4  ALBUMIN 3.7   No results for input(s): LIPASE, AMYLASE in the last 168 hours. No results for input(s): AMMONIA in the last 168 hours. CBC:  Recent Labs Lab 04/25/14 2002  WBC 8.8  NEUTROABS 6.6  HGB 7.8*  HCT 26.0*  MCV 79.8  PLT 476*   Cardiac Enzymes: No results for input(s): CKTOTAL, CKMB, CKMBINDEX, TROPONINI in the last 168 hours.  BNP (last 3 results) No results for input(s): PROBNP in the last 8760 hours. CBG: No results for input(s): GLUCAP in the last 168 hours.  Radiological Exams on  Admission: No results found.  EKG: Independently reviewed.   Assessment/Plan Principal Problem:   GIB (gastrointestinal bleeding) Active Problems:   Anemia   Hypertension   GI bleed   HLD (hyperlipidemia)   Leg swelling  GIB: It is most likely due to AVM bleeding. The triggering factor is likely due to Alka-Seltzer use, which contains aspirin 325 mg. Currently patient is hemodynamically stable.   - will admit to tele bed - NPO for possible EGD - NS at 75 mL/hr - Start IV pantoprazole 40 mg bib - Zofran IV for nausea - Avoid ASA  And NSAIDs and SQ heparin - Monitor closely and follow q6h cbc, transfuse as necessary. - LaB:  INR, PTT, troponin x 1, Lactate -cbc q6h, may call [facility] after 7:00 AM (patietn was seen by Dr. Janalee Dane GI last time. [facility] is not on-call tonight) if her Hgb drops further.  Hypertension:  Patient is on Lasix, amlodipine and metoprolol at home. Her blood pressure is soft on admission. -Will hold her Lasix and amlodipine  -continue metoprolol.  Anemia secondary to GI bleeding: patient is on iron supplement at home. -We continue ferrous sulfate  Hyperlipidemia: LDL was 219 on 07/28/09. No recent Lipid profile on record. Patient is on pravastatin at home. -Continue pravastatin -Check lipid profile in morning  Chronic leg edema: unclear diagnosis. Patient is on Lasix at home. Currently patient does not have leg edema. -Will hold Lasix in the setting of GIB -check BNP, if up, will consider 2D echo  Running nose: Likely due to mild viral infection -Check flu PCR   DVT ppx: SCD  Code Status: Full code Family Communication: None at bed side.     Disposition Plan: Admit to inpatient   Date of Service 04/25/2014    Ivor Costa [facility] Pager [phone]  If 7PM-7AM, please contact night-coverage [email or web address] Password [facility] 04/25/2014, 9:56 PM

## 2014-04-26 ENCOUNTER — Encounter (HOSPITAL_COMMUNITY): Payer: Self-pay

## 2014-04-26 DIAGNOSIS — K31819 Angiodysplasia of stomach and duodenum without bleeding: Secondary | ICD-10-CM

## 2014-04-26 DIAGNOSIS — D62 Acute posthemorrhagic anemia: Secondary | ICD-10-CM

## 2014-04-26 DIAGNOSIS — E66813 Obesity, class 3: Secondary | ICD-10-CM | POA: Diagnosis present

## 2014-04-26 DIAGNOSIS — K922 Gastrointestinal hemorrhage, unspecified: Secondary | ICD-10-CM

## 2014-04-26 DIAGNOSIS — Q2733 Arteriovenous malformation of digestive system vessel: Secondary | ICD-10-CM

## 2014-04-26 LAB — CBC
HCT: 25.5 % — ABNORMAL LOW (ref 36.0–46.0)
HCT: 25 % — ABNORMAL LOW (ref 36.0–46.0)
HEMATOCRIT: 24 % — AB (ref 36.0–46.0)
HEMATOCRIT: 25.6 % — AB (ref 36.0–46.0)
HEMATOCRIT: 25.7 % — AB (ref 36.0–46.0)
HEMOGLOBIN: 7.8 g/dL — AB (ref 12.0–15.0)
Hemoglobin: 7.1 g/dL — ABNORMAL LOW (ref 12.0–15.0)
Hemoglobin: 7.6 g/dL — ABNORMAL LOW (ref 12.0–15.0)
Hemoglobin: 7.4 g/dL — ABNORMAL LOW (ref 12.0–15.0)
Hemoglobin: 7.7 g/dL — ABNORMAL LOW (ref 12.0–15.0)
MCH: 23.5 pg — ABNORMAL LOW (ref 26.0–34.0)
MCH: 23.6 pg — ABNORMAL LOW (ref 26.0–34.0)
MCH: 23.8 pg — ABNORMAL LOW (ref 26.0–34.0)
MCH: 24.3 pg — AB (ref 26.0–34.0)
MCH: 24.6 pg — ABNORMAL LOW (ref 26.0–34.0)
MCHC: 29.6 g/dL — ABNORMAL LOW (ref 30.0–36.0)
MCHC: 29.6 g/dL — ABNORMAL LOW (ref 30.0–36.0)
MCHC: 29.8 g/dL — ABNORMAL LOW (ref 30.0–36.0)
MCHC: 30 g/dL (ref 30.0–36.0)
MCHC: 30.5 g/dL (ref 30.0–36.0)
MCV: 79.4 fL (ref 78.0–100.0)
MCV: 79.6 fL (ref 78.0–100.0)
MCV: 79.7 fL (ref 78.0–100.0)
MCV: 80.8 fL (ref 78.0–100.0)
MCV: 81.5 fL (ref 78.0–100.0)
PLATELETS: 456 10*3/uL — AB (ref 150–400)
PLATELETS: 444 10*3/uL — AB (ref 150–400)
Platelets: 473 10*3/uL — ABNORMAL HIGH (ref 150–400)
Platelets: 482 10*3/uL — ABNORMAL HIGH (ref 150–400)
Platelets: 482 10*3/uL — ABNORMAL HIGH (ref 150–400)
RBC: 3.01 MIL/uL — ABNORMAL LOW (ref 3.87–5.11)
RBC: 3.13 MIL/uL — ABNORMAL LOW (ref 3.87–5.11)
RBC: 3.15 MIL/uL — ABNORMAL LOW (ref 3.87–5.11)
RBC: 3.17 MIL/uL — AB (ref 3.87–5.11)
RBC: 3.23 MIL/uL — ABNORMAL LOW (ref 3.87–5.11)
RDW: 20.4 % — AB (ref 11.5–15.5)
RDW: 20.8 % — ABNORMAL HIGH (ref 11.5–15.5)
RDW: 21 % — AB (ref 11.5–15.5)
RDW: 21.1 % — ABNORMAL HIGH (ref 11.5–15.5)
RDW: 21.1 % — ABNORMAL HIGH (ref 11.5–15.5)
WBC: 7.6 10*3/uL (ref 4.0–10.5)
WBC: 8 10*3/uL (ref 4.0–10.5)
WBC: 8.4 10*3/uL (ref 4.0–10.5)
WBC: 8.7 10*3/uL (ref 4.0–10.5)
WBC: 8.9 10*3/uL (ref 4.0–10.5)

## 2014-04-26 LAB — BASIC METABOLIC PANEL
ANION GAP: 7 (ref 5–15)
BUN: 9 mg/dL (ref 6–23)
CO2: 24 mmol/L (ref 19–32)
Calcium: 8.8 mg/dL (ref 8.4–10.5)
Chloride: 105 mEq/L (ref 96–112)
Creatinine, Ser: 1.01 mg/dL (ref 0.50–1.10)
GFR calc Af Amer: 72 mL/min — ABNORMAL LOW (ref >90)
GFR calc non Af Amer: 62 mL/min — ABNORMAL LOW (ref >90)
GLUCOSE: 98 mg/dL (ref 70–99)
Potassium: 3.3 mmol/L — ABNORMAL LOW (ref 3.5–5.1)
SODIUM: 136 mmol/L (ref 135–145)

## 2014-04-26 LAB — LIPID PANEL
Cholesterol: 196 mg/dL (ref 0–200)
HDL: 25 mg/dL — ABNORMAL LOW (ref >39)
LDL Cholesterol: 144 mg/dL — ABNORMAL HIGH (ref 0–99)
Total CHOL/HDL Ratio: 7.8 RATIO
Triglycerides: 133 mg/dL (ref <150)
VLDL: 27 mg/dL (ref 0–40)

## 2014-04-26 LAB — GLUCOSE, CAPILLARY
Glucose-Capillary: 88 mg/dL (ref 70–99)
Glucose-Capillary: 88 mg/dL (ref 70–99)

## 2014-04-26 LAB — INFLUENZA PANEL BY PCR (TYPE A & B)
H1N1 flu by pcr: NOT DETECTED
Influenza A By PCR: NEGATIVE
Influenza B By PCR: NEGATIVE

## 2014-04-26 LAB — TROPONIN I

## 2014-04-26 LAB — LACTIC ACID, PLASMA: LACTIC ACID, VENOUS: 0.8 mmol/L (ref 0.5–2.2)

## 2014-04-26 LAB — APTT: aPTT: 29 seconds (ref 24–37)

## 2014-04-26 MED ORDER — SODIUM CHLORIDE 0.9 % IV SOLN
INTRAVENOUS | Status: DC
  Administered 2014-04-26 – 2014-04-27 (×3): via INTRAVENOUS

## 2014-04-26 NOTE — Consult Note (Signed)
Referring Provider: Dr. Gevena Barre Primary Care Physician:  Elizabeth Palau, MD Primary Gastroenterologist:  None (unassigned)  Reason for Consultation:  Recurrent anemia, history of gastroduodenal AVMs.  HPI: Peggy House is a 54 y.o. female approximately 2 weeks status post hospitalization for profound anemia, with a hemoglobin of 4.6, MCV 72, ferritin 6, Hemoccult positive. Her hemoglobin had been 9.3 roughly 6 months earlier. Her BUN was normal at the time of admission on that medication, suggesting chronic rather than acute GI blood loss. She received 4 units of packed cells. She had been on ibuprofen prior to that admission. She underwent endoscopy by Dr. Wilford Corner, with argon plasma coagulation therapy of gastroduodenal AVMs at that time.   With that background, the patient indicates that on the day of admission, yesterday, she had coffee-ground emesis and dark liquid stools. No syncope. On account of this, she presented yesterday to the emergency room, where her hemoglobin was 7.8, as compared to 9.1 at the time of discharge 2 weeks earlier. She did not have a Hemoccult exam on this admission. She does not have any significant dyspeptic symptomatology, but does admit to taking a couple of Alka-Seltzer cold tablet since her last admission, although no further ibuprofen. She has not had any further dark stools since yesterday.  The patient has never had colonoscopic evaluation. This had been intended to be done as an outpatient.   Past Medical History  Diagnosis Date  . Hypertension   . Anxiety   . History of swelling of feet   . Snores   . Arthritis     knnes,back    Past Surgical History  Procedure Laterality Date  . Abdominal hysterectomy    . L shoulder surgery  2011  . Carpal tunnel release  05/13/2011    Procedure: CARPAL TUNNEL RELEASE;  Surgeon: Nita Sells, MD;  Location: [place];  Service: Orthopedics;  Laterality: Left;  .  Dilation and curettage of uterus    . Dg toes*l*  2/10    rt  . Esophagogastroduodenoscopy N/A 04/10/2014    Procedure: ESOPHAGOGASTRODUODENOSCOPY (EGD);  Surgeon: [name], MD;  Location: [facility] ENDOSCOPY;  Service: Endoscopy;  Laterality: N/A;    Prior to Admission medications   Medication Sig Start Date End Date Taking? Authorizing Provider  ALPRAZolam Duanne Moron) 1 MG tablet Take 1 mg by mouth every morning.    Yes Historical Provider, MD  amLODipine (NORVASC) 10 MG tablet Take 10 mg by mouth daily.   Yes Historical Provider, MD  ferrous sulfate 325 (65 FE) MG tablet Take 1 tablet (325 mg total) by mouth 2 (two) times daily with a meal. 04/12/14  Yes Velvet Bathe, MD  furosemide (LASIX) 40 MG tablet Take 40 mg by mouth daily.   Yes Historical Provider, MD  metoprolol (LOPRESSOR) 50 MG tablet Take 50 mg by mouth daily.   Yes Historical Provider, MD  pantoprazole (PROTONIX) 40 MG tablet Take 1 tablet (40 mg total) by mouth daily. 04/12/14  Yes Velvet Bathe, MD  Phenyleph-CPM-DM-APAP (ALKA-SELTZER PLUS COLD & COUGH) 08-14-08-325 MG CAPS Take 2 capsules by mouth daily as needed (for cold).   Yes Historical Provider, MD  pravastatin (PRAVACHOL) 40 MG tablet Take 40 mg by mouth daily.   Yes Historical Provider, MD    Current Facility-Administered Medications  Medication Dose Route Frequency Provider Last Rate Last Dose  . 0.9 %  sodium chloride infusion   Intravenous Continuous Annita Brod, MD 100 mL/hr at 04/26/14 1900    .  acetaminophen (TYLENOL) tablet 650 mg  650 mg Oral Q6H PRN Ivor Costa, MD      . guaiFENesin-dextromethorphan (ROBITUSSIN DM) 100-10 MG/5ML syrup 5 mL  5 mL Oral Q4H PRN Ivor Costa, MD      . ondansetron [facility]) tablet 4 mg  4 mg Oral Q6H PRN Ivor Costa, MD       Or  . ondansetron [facility]) injection 4 mg  4 mg Intravenous Q6H PRN Ivor Costa, MD      . pantoprazole (PROTONIX) injection 40 mg  40 mg Intravenous Q12H Ivor Costa, MD   40 mg at 04/26/14 1132  . sodium  chloride 0.9 % injection 3 mL  3 mL Intravenous Q12H Ivor Costa, MD   3 mL at 04/26/14 1132    Allergies as of 04/25/2014 - Review Complete 04/25/2014  Allergen Reaction Noted  . Nsaids Other (See Comments) 04/25/2014  . Tomato Hives 01/01/2012    Family History  Problem Relation Age of Onset  . Cancer Mother   . Ovarian cancer Mother   . Diabetes type II Sister   . Dysmenorrhea Neg Hx   . Diabetes Sister   . Colon cancer Maternal Grandfather   . Stomach cancer Maternal Grandmother     History   Social History  . Marital Status: Single    Spouse Name: N/A    Number of Children: N/A  . Years of Education: N/A   Occupational History  . Not on file.   Social History Main Topics  . Smoking status: Current Every Day Smoker -- 0.50 packs/day for 30 years    Types: Cigarettes  . Smokeless tobacco: Never Used  . Alcohol Use: No  . Drug Use: No     Comment: last cocaine-2010  . Sexual Activity: Not on file   Other Topics Concern  . Not on file   Social History Narrative    Review of Systems: See history of present illness  Physical Exam: Vital signs in last 24 hours: Temp:  [98.1 F (36.7 C)-98.6 F (37 C)] 98.1 F (36.7 C) (01/12 1849) Pulse Rate:  [58-76] 58 (01/12 1849) Resp:  [13-20] 20 (01/12 1849) BP: (102-144)/(47-99) 131/60 mmHg (01/12 1849) SpO2:  [98 %-100 %] 98 % (01/12 1849) Weight:  [114.715 kg (252 lb 14.4 oz)-117.935 kg (260 lb)] 114.715 kg (252 lb 14.4 oz) (01/11 2247) Last BM Date: 04/25/14 General:   Alert,  Well-developed, significantly overweight, pleasant and cooperative in NAD Head:  Normocephalic and atraumatic. Eyes:  Sclera clear, no icterus.   Conjunctiva pale. Mouth:   No ulcerations or lesions.  Oropharynx pink & moist. Neck:   No masses or thyromegaly. Lungs:  Clear throughout to auscultation.   No wheezes, crackles, or rhonchi. No evident respiratory distress. Heart:   Regular rate and rhythm; no murmurs, clicks, rubs,  or  gallops. Abdomen:  Soft, nontender, nontympanitic, and nondistended. No masses, hepatosplenomegaly or ventral hernias noted. Normal bowel sounds, without bruits, guarding, or rebound.   Msk:   Symmetrical without gross deformities. Pulses:  Normal radial pulse noted. Extremities:   Without clubbing, cyanosis, or edema. Neurologic:  Alert and coherent;  grossly normal neurologically. Skin:  Intact without significant lesions or rashes. Cervical Nodes:  No significant cervical adenopathy. Psych:   Alert and cooperative. Normal mood and affect.  Intake/Output from previous day:   Intake/Output this shift:    Lab Results:  Recent Labs  04/26/14 0535 04/26/14 1000 04/26/14 1655  WBC 7.6 8.0 8.4  HGB 7.1*  7.6* 7.4*  HCT 24.0* 25.5* 25.0*  PLT 473* 456* 482*   BMET  Recent Labs  04/25/14 2002 04/26/14 0535  NA 137 136  K 3.5 3.3*  CL 106 105  CO2 19 24  GLUCOSE 108* 98  BUN 9 9  CREATININE 1.11* 1.01  CALCIUM 9.3 8.8   LFT  Recent Labs  04/25/14 2002  PROT 7.4  ALBUMIN 3.7  AST 19  ALT 12  ALKPHOS 84  BILITOT 0.5   PT/INR  Recent Labs  04/25/14 2120  LABPROT 14.8  INR 1.15    Studies/Results: No results found.  Impression: 1. Mild drop in hemoglobin with clinical evidence of upper tract bleeding (dark emesis and dark stools, by patient's history), occurring about 2 weeks after our count plasma coagulator treatment of AVMs in the setting of chronic iron deficiency anemia.  Plan: Endoscopic evaluation with possible repeat argon plasma coagulator treatment tomorrow. Purpose and risks reviewed.  Depending on those findings, the patient may need colonoscopic evaluation and/or video capsule endoscopy evaluation of the small bowel.   LOS: 1 day   Obrian Bulson V  04/26/2014, 7:42 PM

## 2014-04-26 NOTE — Progress Notes (Signed)
INITIAL NUTRITION ASSESSMENT  DOCUMENTATION CODES Per approved criteria  -Morbid Obesity   INTERVENTION: Once diet advances, RD to order oral supplements if po intake becomes poor.  NUTRITION DIAGNOSIS: Inadequate oral intake related to decreased appetite as evidenced by pt report.   Goal: Pt to meet >/= 90% of their estimated nutrition needs   Monitor:  Diet advancement, weight trends, labs, I/O's  Reason for Assessment: MST  54 y.o. female  Admitting Dx: GIB (gastrointestinal bleeding)  ASSESSMENT: Pt with past medical history of hypertension, hyperlipidemia, GERD, anxiety, recent hx of upper GI bleeding secondary to AVM,, chronic leg edema without clear diagnosis, who presents with melena and hematemesis.  Pt reports having a decreased appetite which has been ongoing over the past 1 month. She reports she has been eating 1 meal a day. The meal would contain a meat, starch, and vegetable. Pt reports wt loss with her usual body weight of 258 lbs, which is a 2.3% loss in 2 weeks and not considered significant. Pt is currently NPO. RD to order pt supplements if po intake becomes poor once diet advances.  Pt with no observed significant fat or muscle mass loss.  Labs: Low potassium and GFR.  Height: Ht Readings from Last 1 Encounters:  04/25/14 5\' 6"  (1.676 m)    Weight: Wt Readings from Last 1 Encounters:  04/25/14 252 lb 14.4 oz (114.715 kg)    Ideal Body Weight: 130 lbs  % Ideal Body Weight: 194%  Wt Readings from Last 10 Encounters:  04/25/14 252 lb 14.4 oz (114.715 kg)  04/12/14 258 lb (117.028 kg)  08/24/13 267 lb 3.2 oz (121.201 kg)  01/01/12 250 lb (113.399 kg)  10/22/11 253 lb 3.2 oz (114.851 kg)  05/13/11 234 lb (106.142 kg)    Usual Body Weight: 258 lbs  % Usual Body Weight: 98%  BMI:  Body mass index is 40.84 kg/(m^2). Morbid obesity  Estimated Nutritional Needs: Kcal: 2000-2300 Protein: 115-130 grams  Fluid: 2 - 2.3 L/day  Skin:  intact  Diet Order: Diet NPO time specified  EDUCATION NEEDS: -No education needs identified at this time  No intake or output data in the 24 hours ending 04/26/14 1003  Last BM: 1/11   Labs:   Recent Labs Lab 04/25/14 2002 04/26/14 0535  NA 137 136  K 3.5 3.3*  CL 106 105  CO2 19 24  BUN 9 9  CREATININE 1.11* 1.01  CALCIUM 9.3 8.8  GLUCOSE 108* 98    CBG (last 3)   Recent Labs  04/26/14 0808  GLUCAP 88    Scheduled Meds: . pantoprazole (PROTONIX) IV  40 mg Intravenous Q12H  . sodium chloride  3 mL Intravenous Q12H    Continuous Infusions: . sodium chloride      Past Medical History  Diagnosis Date  . Hypertension   . Anxiety   . History of swelling of feet   . Snores   . Arthritis     knnes,back    Past Surgical History  Procedure Laterality Date  . Abdominal hysterectomy    . L shoulder surgery  2011  . Carpal tunnel release  05/13/2011    Procedure: CARPAL TUNNEL RELEASE;  Surgeon: Peggy Sells, MD;  Location: [place];  Service: Orthopedics;  Laterality: Left;  . Dilation and curettage of uterus    . Dg toes*l*  2/10    rt  . Esophagogastroduodenoscopy N/A 04/10/2014    Procedure: ESOPHAGOGASTRODUODENOSCOPY (EGD);  Surgeon: Peggy Ng,  MD;  Location: [place] ENDOSCOPY;  Service: Endoscopy;  Laterality: N/A;    Peggy Locks, MS, RD, LDN Pager # [phone] After hours/ weekend pager # [phone]

## 2014-04-26 NOTE — Progress Notes (Signed)
PROGRESS NOTE  ELOUISE DIVELBISS PZW:[id number] DOB: 06/09/1960 DOA: 04/25/2014 PCP: Elizabeth Palau, MD  HPI/Recap of past 24 hours: Patient is a 54 year old female with past mental history of hypertension and morbid obesity who was discharged from the hospitalist service about 2 weeks prior for GI bleed secondary to AVMs. Patient initially okay, but then came to the emergency room on 1/11 evening after having an early-morning episode of hematemesis followed by an episode of melena. No other complaints other than generalized weakness which has been ongoing since discharge. In the emergency room, patient's hemoglobin was noted to be 7.8 , which previously had been 9.1 two weeks prior.  Hospitalists call for further evaluation and admission.  Following morning, patient's hemoglobin had come down to 7.1, approximately 6 hours later but a repeat hemoglobin 6 hours after that was back up to 7.6. Patient made nothing by mouth, put on PPI and just her neurology consulted. Patient is self complains of some mild abdominal discomfort and some mild nausea   Assessment/Plan: Principal Problem:  Acute blood loss anemia and GIB (gastrointestinal bleeding) in patient with history of gastric AVMs: Patient seen and underwent endoscopy by [facility] gastroenterology 2 weeks prior. She is considered unassigned patient and [facility] asked to see during this admission. Continue nothing by mouth until decision made for repeat scope. Following hemoglobin which looks to have leveled off. No transfusion indicated unless hemoglobin falls below 7. Active Problems:    Hypertension: Holding antihypertensives   HLD (hyperlipidemia): Holding statin    Morbid obesity: Patient meets criteria with BMI greater than 40  CODE STATUS: Full code   Family Communication: left message with sister   Disposition Plan: await hemoglobin stabilization and plan by gastroenterology    Consultants:  [place] [facility]    Procedures:  None   Antibiotics:  None    Objective: BP 129/55 mmHg  Pulse 64  Temp(Src) 98.5 F (36.9 C) (Oral)  Resp 16  Ht 5\' 6"  (1.676 m)  Wt 114.715 kg (252 lb 14.4 oz)  BMI 40.84 kg/m2  SpO2 99% No intake or output data in the 24 hours ending 04/26/14 1601 Filed Weights   04/25/14 1956 04/25/14 2247  Weight: 117.935 kg (260 lb) 114.715 kg (252 lb 14.4 oz)    Exam:   General:  alert and oriented 3, no acute distress   Cardiovascular: regular rate and rhythm, S1 and S2   Respiratory: Clear to auscultation bilaterally   Abdomen: Soft, obese, nontender, positive bowel sounds  Musculoskeletal: no clubbing or cyanosis, trace edema    Data Reviewed: Basic Metabolic Panel:  Recent Labs Lab 04/25/14 2002 04/26/14 0535  NA 137 136  K 3.5 3.3*  CL 106 105  CO2 19 24  GLUCOSE 108* 98  BUN 9 9  CREATININE 1.11* 1.01  CALCIUM 9.3 8.8   Liver Function Tests:  Recent Labs Lab 04/25/14 2002  AST 19  ALT 12  ALKPHOS 84  BILITOT 0.5  PROT 7.4  ALBUMIN 3.7   No results for input(s): LIPASE, AMYLASE in the last 168 hours. No results for input(s): AMMONIA in the last 168 hours. CBC:  Recent Labs Lab 04/25/14 2002 04/25/14 2347 04/26/14 0535 04/26/14 1000  WBC 8.8 8.7 7.6 8.0  NEUTROABS 6.6  --   --   --   HGB 7.8* 7.8* 7.1* 7.6*  HCT 26.0* 25.6* 24.0* 25.5*  MCV 79.8 80.8 79.7 81.5  PLT 476* 482* 473* 456*   Cardiac Enzymes:    Recent Labs  Lab 04/25/14 2347  TROPONINI <0.03   BNP (last 3 results) No results for input(s): PROBNP in the last 8760 hours. CBG:  Recent Labs Lab 04/26/14 0808 04/26/14 1157  GLUCAP 88 88    No results found for this or any previous visit (from the past 240 hour(s)).   Studies: No results found.  Scheduled Meds: . pantoprazole (PROTONIX) IV  40 mg Intravenous Q12H  . sodium chloride  3 mL Intravenous Q12H    Continuous Infusions: . sodium chloride 100 mL/hr at 04/26/14 1300     Time  spent: 25 minutes  [facility] Pager [phone]. If 7PM-7AM, please contact night-coverage at [email or web address], password [facility] 04/26/2014, 4:01 PM  LOS: 1 day

## 2014-04-27 ENCOUNTER — Inpatient Hospital Stay (HOSPITAL_COMMUNITY): Payer: Medicaid Other | Admitting: Anesthesiology

## 2014-04-27 ENCOUNTER — Encounter (HOSPITAL_COMMUNITY)
Admission: EM | Disposition: A | Discharge status: Home / Self Care | Payer: Self-pay | Source: Home / Self Care | Attending: Internal Medicine

## 2014-04-27 ENCOUNTER — Encounter (HOSPITAL_COMMUNITY): Payer: Self-pay | Admitting: Anesthesiology

## 2014-04-27 DIAGNOSIS — D649 Anemia, unspecified: Secondary | ICD-10-CM

## 2014-04-27 HISTORY — PX: ESOPHAGOGASTRODUODENOSCOPY (EGD) WITH PROPOFOL: SHX5813

## 2014-04-27 HISTORY — PX: HOT HEMOSTASIS: SHX5433

## 2014-04-27 LAB — PREPARE RBC (CROSSMATCH)

## 2014-04-27 LAB — HEMOGLOBIN AND HEMATOCRIT, BLOOD
HCT: 31.4 % — ABNORMAL LOW (ref 36.0–46.0)
Hemoglobin: 9.9 g/dL — ABNORMAL LOW (ref 12.0–15.0)

## 2014-04-27 LAB — GLUCOSE, CAPILLARY: Glucose-Capillary: 117 mg/dL — ABNORMAL HIGH (ref 70–99)

## 2014-04-27 LAB — CBC
HCT: 24.2 % — ABNORMAL LOW (ref 36.0–46.0)
Hemoglobin: 7.3 g/dL — ABNORMAL LOW (ref 12.0–15.0)
MCH: 23.9 pg — ABNORMAL LOW (ref 26.0–34.0)
MCHC: 30.2 g/dL (ref 30.0–36.0)
MCV: 79.3 fL (ref 78.0–100.0)
Platelets: 469 10*3/uL — ABNORMAL HIGH (ref 150–400)
RBC: 3.05 MIL/uL — AB (ref 3.87–5.11)
RDW: 20.5 % — ABNORMAL HIGH (ref 11.5–15.5)
WBC: 8.3 10*3/uL (ref 4.0–10.5)

## 2014-04-27 SURGERY — ESOPHAGOGASTRODUODENOSCOPY (EGD) WITH PROPOFOL
Anesthesia: Monitor Anesthesia Care

## 2014-04-27 MED ORDER — SODIUM CHLORIDE 0.9 % IV SOLN: INTRAVENOUS | Status: DC

## 2014-04-27 MED ORDER — BUTAMBEN-TETRACAINE-BENZOCAINE 2-2-14 % EX AERO
INHALATION_SPRAY | CUTANEOUS | Status: DC | PRN
  Administered 2014-04-27: 2 via TOPICAL

## 2014-04-27 MED ORDER — PROPOFOL INFUSION 10 MG/ML OPTIME
INTRAVENOUS | Status: DC | PRN
  Administered 2014-04-27: 100 ug/kg/min via INTRAVENOUS

## 2014-04-27 MED ORDER — MIDAZOLAM HCL 5 MG/5ML IJ SOLN
INTRAMUSCULAR | Status: DC | PRN
Start: 2014-04-27 — End: 2014-04-27
  Administered 2014-04-27: 2 mg via INTRAVENOUS

## 2014-04-27 MED ORDER — FENTANYL CITRATE 0.05 MG/ML IJ SOLN
INTRAMUSCULAR | Status: DC | PRN
  Administered 2014-04-27: 50 ug via INTRAVENOUS

## 2014-04-27 MED ORDER — SODIUM CHLORIDE 0.9 % IV SOLN: Freq: Once | INTRAVENOUS | Status: DC

## 2014-04-27 MED ORDER — LACTATED RINGERS IV SOLN
INTRAVENOUS | Status: DC | PRN
  Administered 2014-04-27: 09:00:00 via INTRAVENOUS

## 2014-04-27 MED ORDER — PEG 3350-KCL-NA BICARB-NACL 420 G PO SOLR
4000.0000 mL | Freq: Once | ORAL | Status: AC
  Administered 2014-04-27: 4000 mL via ORAL
  Filled 2014-04-27: qty 4000

## 2014-04-27 MED ORDER — LACTATED RINGERS IV SOLN: INTRAVENOUS | Status: DC

## 2014-04-27 MED ORDER — PROPOFOL 10 MG/ML IV BOLUS
INTRAVENOUS | Status: DC | PRN
  Administered 2014-04-27: 20 mg via INTRAVENOUS
  Administered 2014-04-27: 10 mg via INTRAVENOUS

## 2014-04-27 NOTE — Op Note (Signed)
[facility] [place], [address]   ENDOSCOPY PROCEDURE REPORT  PATIENT: Peggy House, Peggy House  MR#: [id number] BIRTHDATE: May 18, 1960 , 33  yrs. old GENDER: female ENDOSCOPIST:Haneef Hallquist, MD REFERRED BY: Moise Boring, MD PROCEDURE DATE:  May 21, 2014 PROCEDURE:   EGD w/ control of hemorrhage ASA CLASS:    III INDICATIONS:  Coffee ground emesis, recurring anemia, melenic stools,recent APC treatment of gastroduodenal AVM's MEDICATION: MAC TOPICAL ANESTHETIC:   Cetacaine spray  DESCRIPTION OF PROCEDURE:   After the risks and benefits of the procedure were explained, informed consent was obtained.  The patient was brought from her hospital room to the [facility] cone endoscopy unit.The Pentax Gastroscope [id number]  endoscope was introduced through the mouth  and advanced to the second portion of the duodenum .  The instrument was slowly withdrawn as the mucosa was fully examined.    the vocal cords looked normal.  the esophagus was readily entered under direct vision and was normal in its entirety, without evidence of reflux esophagitis, Barrett's esophagus, varices, infection, neoplasia, Mallory-Weiss tear, ring, stricture, or hiatal hernia.  The stomach was entered. There was a small amount of old blood and a small clot present. On closer inspection, there was an actively oozing AVM on the anterior aspect of the greater curve. I did not see an ulcer at this location, nor did I see a definite eschar at that location from any previous APC treatment. In addition, there were some non-adherent coffee-ground flecks on the gastric mucosa, and a few pinpoint (1-2 mm) erythematous blotches consistent with small vascular ectasia area no erosions, ulcers, polyps, masses, or diffuse gastritis were noted. retroflexion showed a normal cardia.  The pylorus, duodenal bulb, and second duodenum were essentially normal other than 1 or 2 faint, tiny AVM-like  lesions.  I elected to treat the oozing AVM. I first used the [place]by argon plasma coagulator to cauterize the area which brought about transient hemostasis but then oozing started again. I therefore decided to clip the lesion. 3 "resolution" clips were successfully applied and this led to what appeared to be complete, and persistent, hemostasis.  Reinspection of the stomach after insufflation did not disclose any other active bleeding lesions or significant suspicious lesions. The scope was therefore then withdrawn from the patient and the procedure completed.  COMPLICATIONS: There were no immediate complications.  ENDOSCOPIC IMPRESSION: 1. Actively oozing gastric lesion, probably a small AVM versus pinpoint ulceration, treated with argon plasma coagulator and then with clipping, with complete hemostasis 2. There were several tiny vascular ectasias scattered in the stomach and proximal duodenum which were not bleeding   RECOMMENDATIONS: 1. Continue antipeptic therapy and observation 2. The patient presented with similar profound anemia on her prior hospitalization, that she needs colonoscopic evaluation to confirm the absence of concurrent lower tract pathology, and this will be planned for tomorrow   _______________________________ eSigned:  Ronald Lobo, MD 05-21-2014 10:10 AM     cc:  CPT CODES: ICD CODES:  The ICD and CPT codes recommended by this software are interpretations from the data that the clinical staff has captured with the software.  The verification of the translation of this report to the ICD and CPT codes and modifiers is the sole responsibility of the health care institution and practicing physician where this report was generated.  [employer]. will not be held responsible for the validity of the ICD and CPT codes included on this report.  AMA assumes no liability for  data contained or not contained herein. CPT is a  Designer, television/film set of the [employer].  PATIENT NAME:  Peggy House MR#: [id number]

## 2014-04-27 NOTE — Anesthesia Preprocedure Evaluation (Addendum)
Anesthesia Evaluation  Patient identified by MRN, date of birth, ID band Patient awake    Reviewed: Allergy & Precautions, NPO status , Patient's Chart, lab work & pertinent test results, reviewed documented beta blocker date and time   History of Anesthesia Complications (+) PONVNegative for: history of anesthetic complications  Airway Mallampati: II  TM Distance: >3 FB Neck ROM: Full    Dental  (+) Teeth Intact, Dental Advisory Given   Pulmonary shortness of breath, Current Smoker,    Pulmonary exam normal       Cardiovascular hypertension,     Neuro/Psych PSYCHIATRIC DISORDERS Anxiety negative neurological ROS     GI/Hepatic negative GI ROS, Neg liver ROS,   Endo/Other  negative endocrine ROS  Renal/GU negative Renal ROS     Musculoskeletal  (+) Arthritis -,   Abdominal   Peds  Hematology   Anesthesia Other Findings   Reproductive/Obstetrics                           Anesthesia Physical Anesthesia Plan  ASA: III  Anesthesia Plan: MAC   Post-op Pain Management:    Induction: Intravenous  Airway Management Planned: Simple Face Mask  Additional Equipment:   Intra-op Plan:   Post-operative Plan:   Informed Consent: I have reviewed the patients History and Physical, chart, labs and discussed the procedure including the risks, benefits and alternatives for the proposed anesthesia with the patient or authorized representative who has indicated his/her understanding and acceptance.   Dental advisory given  Plan Discussed with: CRNA, Anesthesiologist and Surgeon  Anesthesia Plan Comments:        Anesthesia Quick Evaluation

## 2014-04-27 NOTE — Transfer of Care (Signed)
Immediate Anesthesia Transfer of Care Note  Patient: Peggy House  Procedure(s) Performed: Procedure(s) with comments: ESOPHAGOGASTRODUODENOSCOPY (EGD) WITH PROPOFOL (N/A) - possible apc HOT HEMOSTASIS (ARGON PLASMA COAGULATION/BICAP) (N/A)  Patient Location: Endoscopy Unit  Anesthesia Type:MAC  Level of Consciousness: awake, alert , oriented and patient cooperative  Airway & Oxygen Therapy: Patient Spontanous Breathing and Patient connected to face mask oxygen  Post-op Assessment: Report given to PACU RN and Post -op Vital signs reviewed and stable  Post vital signs: Reviewed and stable  Complications: No apparent anesthesia complications

## 2014-04-27 NOTE — Progress Notes (Signed)
Spoke with Dr. Saralyn Pilar, [place] to give A+ blood to patient.

## 2014-04-27 NOTE — Progress Notes (Signed)
Patient's EGD showed an actively oozing AVM, treated successfully with argon plasma coagulator and clipping. Please see dictated procedure report.  Although it does appear that the patient's profound anemia is coming from her upper tract lesions, I have put the patient on for colonoscopy tomorrow morning. (Colonoscopy was part of her management plan at the time of her recent discharge, but she prefers to have it done as an inpatient, and I'm somewhat concerned about reliability of outpatient follow-up.)  Cleotis Nipper, M.D. [phone]

## 2014-04-27 NOTE — Anesthesia Postprocedure Evaluation (Signed)
Anesthesia Post Note  Patient: Peggy House  Procedure(s) Performed: Procedure(s) (LRB): ESOPHAGOGASTRODUODENOSCOPY (EGD) WITH PROPOFOL (N/A) HOT HEMOSTASIS (ARGON PLASMA COAGULATION/BICAP) (N/A)  Anesthesia type: MAC  Patient location: PACU  Post pain: Pain level controlled  Post assessment: Patient's Cardiovascular Status Stable  Last Vitals:  Filed Vitals:   04/27/14 1020  BP: 136/74  Pulse: 55  Temp: 36.7 C  Resp: 18    Post vital signs: Reviewed and stable  Level of consciousness: sedated  Complications: No apparent anesthesia complications

## 2014-04-27 NOTE — Progress Notes (Signed)
PROGRESS NOTE  Peggy House CZY:[id number] DOB: 06-Feb-1961 DOA: 04/25/2014 PCP: Elizabeth Palau, MD  History of present illness Patient is a 54 year old female with past mental history of hypertension and morbid obesity who was discharged from the hospitalist service about 2 weeks prior for GI bleed secondary to AVMs. Patient initially okay, but then came to the emergency room on 1/11 evening after having an early-morning episode of hematemesis followed by an episode of melena. No other complaints other than generalized weakness which has been ongoing since discharge. In the emergency room, patient's hemoglobin was noted to be 7.8 , which previously had been 9.1 two weeks prior.  Hospitalists call for further evaluation and admission. Following morning, patient's hemoglobin had come down to 7.1, approximately 6 hours later but a repeat hemoglobin 6 hours after that was back up to 7.6. Patient made NPO,  PPI and GI was consulted.   Assessment/Plan: Principal Problem:  Acute blood loss anemia and GIB (gastrointestinal bleeding) in patient with history of gastric AVMs: - - Patient seen and underwent endoscopy by [facility] gastroenterology 2 weeks prior. GI was consulted, patient underwent EGD this morning which showed actively oozing gastric lesion, probably a small AVM versus pinpoint ulceration, treated with argon plasma coagulator, clipping with complete hemostasis, several tiny vascular ectasias in the stomach and proximal duodenum which were not bleeding - GI recommending colonoscopy tomorrow - Hemoglobin 7.3 today, will transfuse 2 unit packed RBCs  Active Problems:    Hypertension: Holding antihypertensives    HLD (hyperlipidemia): Holding statin    Morbid obesity: Patient meets criteria with BMI greater than 40  CODE STATUS: Full code   Family Communication:   Disposition Plan:    Consultants:  [facility], Dr. Cristina Gong  Procedures:  EGD ENDOSCOPIC  IMPRESSION: 1. Actively oozing gastric lesion, probably a small AVM versus pinpoint ulceration, treated with argon plasma coagulator and then with clipping, with complete hemostasis 2. There were several tiny vascular ectasias scattered in the stomach and proximal duodenum which were not bleeding   Antibiotics:  None    Subjective Patient seen and examined, no active bleeding, awaiting endoscopy at the time of my examination.    Objective: BP 136/74 mmHg  Pulse 55  Temp(Src) 98 F (36.7 C) (Oral)  Resp 18  Ht 5\' 6"  (1.676 m)  Wt 114 kg (251 lb 5.2 oz)  BMI 40.58 kg/m2  SpO2 99%  Intake/Output Summary (Last 24 hours) at 04/27/14 1037 Last data filed at 04/27/14 0950  Gross per 24 hour  Intake 2466.67 ml  Output     20 ml  Net 2446.67 ml   Filed Weights   04/25/14 1956 04/25/14 2247 04/26/14 2100  Weight: 117.935 kg (260 lb) 114.715 kg (252 lb 14.4 oz) 114 kg (251 lb 5.2 oz)    Exam:   General:   alert and oriented 3, NAD   Cardiovascular:  S1 and S2 clear, RRR  Respiratory: Clear to auscultation bilaterally   Abdomen: Soft, obese, nontender, positive bowel sounds  Ext: no clubbing or cyanosis, trace edema    Data Reviewed: Basic Metabolic Panel:  Recent Labs Lab 04/25/14 2002 04/26/14 0535  NA 137 136  K 3.5 3.3*  CL 106 105  CO2 19 24  GLUCOSE 108* 98  BUN 9 9  CREATININE 1.11* 1.01  CALCIUM 9.3 8.8   Liver Function Tests:  Recent Labs Lab 04/25/14 2002  AST 19  ALT 12  ALKPHOS 84  BILITOT 0.5  PROT 7.4  ALBUMIN  3.7   No results for input(s): LIPASE, AMYLASE in the last 168 hours. No results for input(s): AMMONIA in the last 168 hours. CBC:  Recent Labs Lab 04/25/14 2002  04/26/14 0535 04/26/14 1000 04/26/14 1655 04/26/14 2239 04/27/14 0513  WBC 8.8  < > 7.6 8.0 8.4 8.9 8.3  NEUTROABS 6.6  --   --   --   --   --   --   HGB 7.8*  < > 7.1* 7.6* 7.4* 7.7* 7.3*  HCT 26.0*  < > 24.0* 25.5* 25.0* 25.7* 24.2*  MCV 79.8  < >  79.7 81.5 79.4 79.6 79.3  PLT 476*  < > 473* 456* 482* 444* 469*  < > = values in this interval not displayed. Cardiac Enzymes:    Recent Labs Lab 04/25/14 2347  TROPONINI <0.03   BNP (last 3 results) No results for input(s): PROBNP in the last 8760 hours. CBG:  Recent Labs Lab 04/26/14 0808 04/26/14 1157 04/27/14 0810  GLUCAP 88 88 117*    No results found for this or any previous visit (from the past 240 hour(s)).   Studies: No results found.  Scheduled Meds: . pantoprazole (PROTONIX) IV  40 mg Intravenous Q12H  . polyethylene glycol-electrolytes  4,000 mL Oral Once  . sodium chloride  3 mL Intravenous Q12H    Continuous Infusions: . sodium chloride 100 mL/hr at 04/27/14 0807  . lactated ringers      Luretha Eberly M.D. [facility] 04/27/2014, 10:42 AM  Pager: [phone]

## 2014-04-28 ENCOUNTER — Encounter (HOSPITAL_COMMUNITY): Payer: Self-pay | Admitting: Gastroenterology

## 2014-04-28 ENCOUNTER — Inpatient Hospital Stay (HOSPITAL_COMMUNITY): Payer: Medicaid Other | Admitting: Certified Registered"

## 2014-04-28 ENCOUNTER — Encounter (HOSPITAL_COMMUNITY)
Admission: EM | Disposition: A | Discharge status: Home / Self Care | Payer: Self-pay | Source: Home / Self Care | Attending: Internal Medicine

## 2014-04-28 HISTORY — PX: COLONOSCOPY WITH PROPOFOL: SHX5780

## 2014-04-28 LAB — TYPE AND SCREEN
ABO/RH(D): A NEG
ANTIBODY SCREEN: NEGATIVE
Unit division: 0
Unit division: 0

## 2014-04-28 LAB — CBC
HCT: 29.3 % — ABNORMAL LOW (ref 36.0–46.0)
Hemoglobin: 9.3 g/dL — ABNORMAL LOW (ref 12.0–15.0)
MCH: 25.8 pg — ABNORMAL LOW (ref 26.0–34.0)
MCHC: 31.7 g/dL (ref 30.0–36.0)
MCV: 81.4 fL (ref 78.0–100.0)
PLATELETS: 426 10*3/uL — AB (ref 150–400)
RBC: 3.6 MIL/uL — ABNORMAL LOW (ref 3.87–5.11)
RDW: 18.7 % — AB (ref 11.5–15.5)
WBC: 9 10*3/uL (ref 4.0–10.5)

## 2014-04-28 SURGERY — COLONOSCOPY WITH PROPOFOL
Anesthesia: Monitor Anesthesia Care

## 2014-04-28 MED ORDER — SODIUM CHLORIDE 0.9 % IV SOLN: INTRAVENOUS | Status: DC

## 2014-04-28 MED ORDER — MIDAZOLAM HCL 5 MG/5ML IJ SOLN
INTRAMUSCULAR | Status: DC | PRN
  Administered 2014-04-28 (×3): 1 mg via INTRAVENOUS

## 2014-04-28 MED ORDER — FERROUS GLUCONATE 324 (38 FE) MG PO TABS
324.0000 mg | ORAL_TABLET | Freq: Every day | ORAL | Status: DC
  Filled 2014-04-28: qty 1

## 2014-04-28 MED ORDER — FERROUS GLUCONATE 324 (38 FE) MG PO TABS: 324.0000 mg | ORAL_TABLET | Freq: Every day | ORAL | Status: DC

## 2014-04-28 MED ORDER — PANTOPRAZOLE SODIUM 40 MG PO TBEC
40.0000 mg | DELAYED_RELEASE_TABLET | Freq: Every day | ORAL | Status: DC
  Administered 2014-04-28: 40 mg via ORAL
  Filled 2014-04-28: qty 1

## 2014-04-28 MED ORDER — GUAIFENESIN-DM 100-10 MG/5ML PO SYRP: 5.0000 mL | ORAL_SOLUTION | ORAL | Status: DC | PRN

## 2014-04-28 MED ORDER — LACTATED RINGERS IV SOLN
INTRAVENOUS | Status: DC | PRN
  Administered 2014-04-28: 10:00:00 via INTRAVENOUS

## 2014-04-28 MED ORDER — PROPOFOL INFUSION 10 MG/ML OPTIME
INTRAVENOUS | Status: DC | PRN
  Administered 2014-04-28: 150 ug/kg/min via INTRAVENOUS

## 2014-04-28 MED ORDER — MIDAZOLAM HCL 5 MG/ML IJ SOLN
INTRAMUSCULAR | Status: AC
  Filled 2014-04-28: qty 1

## 2014-04-28 NOTE — Transfer of Care (Signed)
Immediate Anesthesia Transfer of Care Note  Patient: Peggy House  Procedure(s) Performed: Procedure(s): COLONOSCOPY WITH PROPOFOL (N/A)  Patient Location: PACU and Endoscopy Unit  Anesthesia Type:MAC  Level of Consciousness: awake and alert   Airway & Oxygen Therapy: Patient Spontanous Breathing and Patient connected to face mask oxygen  Post-op Assessment: Report given to PACU RN and Post -op Vital signs reviewed and stable  Post vital signs: Reviewed and stable  Complications: No apparent anesthesia complications

## 2014-04-28 NOTE — Discharge Summary (Signed)
Physician Discharge Summary  Patient ID: Peggy House MRN: [id number] DOB/AGE: 09-12-1960 54 y.o.  Admit date: 04/25/2014 Discharge date: 04/28/2014  Primary Care Physician:  Elizabeth Palau, MD  Discharge Diagnoses:    . acute blood loss Anemia . GIB (gastrointestinal bleeding)- upper GI from actively oozing gastric lesion, probably small AVM versus pinpoint ulceration  . HLD (hyperlipidemia) . Leg swelling . Hypertension . Morbid obesity  Consults:  Gastroenterology, Dr. Cristina Gong   Recommendations for Outpatient Follow-up:  Per GI recommendations, patient will need to be on PPI for at least couple of months. She needs follow-up appointment with the next 10 days for CBC, Hemoccult status. Her PCP needs to place a referral to gastroenterology for follow-up as patient has [place] access Medicaid.  TESTS THAT NEED FOLLOW-UP CBC, stool occult test   DIET: Heart healthy diet  Allergies:   Allergies  Allergen Reactions  . Nsaids Other (See Comments)    Stomach bleeding episodes  . Tomato Hives     Discharge Medications:   Medication List    TAKE these medications        ALKA-SELTZER PLUS COLD & COUGH 08-14-08-325 MG Caps  Generic drug:  Phenyleph-CPM-DM-APAP  Take 2 capsules by mouth daily as needed (for cold).     ALPRAZolam 1 MG tablet  Commonly known as:  XANAX  Take 1 mg by mouth every morning.     amLODipine 10 MG tablet  Commonly known as:  NORVASC  Take 10 mg by mouth daily.     ferrous gluconate 324 MG tablet  Commonly known as:  FERGON  Take 1 tablet (324 mg total) by mouth daily with breakfast.  Start taking on:  04/29/2014     ferrous sulfate 325 (65 FE) MG tablet  Take 1 tablet (325 mg total) by mouth 2 (two) times daily with a meal.     furosemide 40 MG tablet  Commonly known as:  LASIX  Take 40 mg by mouth daily.     guaiFENesin-dextromethorphan 100-10 MG/5ML syrup  Commonly known as:  ROBITUSSIN DM  Take 5 mLs by mouth every 4 (four)  hours as needed for cough.     metoprolol 50 MG tablet  Commonly known as:  LOPRESSOR  Take 50 mg by mouth daily.     pantoprazole 40 MG tablet  Commonly known as:  PROTONIX  Take 1 tablet (40 mg total) by mouth daily.     pravastatin 40 MG tablet  Commonly known as:  PRAVACHOL  Take 40 mg by mouth daily.         Brief H and P: For complete details please refer to admission H and P, but in brief the patient is a 54 year old female with hypertension, DU B, anemia, presented with shortness of breath and fatigue ongoing for last 1 week and progressively worsening. Patient had also been treated for bronchitis with antibiotics by her PCP. Patient had noticed an episode of diarrhea with black colored bowel movements however since last 1 week after finishing antibiotic she denied any melena. She has GERD off and on. Patient was found to have hemoglobin of 7.8. She was admitted for further workup. It was 9.1 at the time of prior discharge on 12/28.  Hospital Course:   Patient is a 54 year old female with hypertension and morbid obesity who was discharged from the hospitalist service about 2 weeks prior for GI bleed secondary to AVMs. Patient initially okay, but then came to the emergency room on 1/11 evening after having  an early-morning episode of hematemesis followed by an episode of melena. No other complaints other than generalized weakness which has been ongoing since discharge. In the emergency room, patient's hemoglobin was noted to be 7.8 , which previously had been 9.1 two weeks prior.   Acute blood loss anemia and GIB (gastrointestinal bleeding) in patient with history of gastric AVMs:  Following the admission next morning, patient's hemoglobin had come down to 7.1, she was placed on NPO status and gastroenterology was consulted.  Patient seen and underwent endoscopy by [facility] gastroenterology 2 weeks prior. GI was consulted, patient underwent EGDon 1/13  which showed actively oozing  gastric lesion, probably a small AVM versus pinpoint ulceration, treated with argon plasma coagulator, clipping with complete hemostasis, several tiny vascular ectasias in the stomach and proximal duodenum which were not bleeding Colonoscopy was fairly normal with no active bleeding or vascular ectasias, multiple small sessile polyps, biopsy pending. Hemoglobin 7.3 1/13,  was transfused 2 units packed RBCs, hemoglobin 9.3  At discharge Patient needs a GI referral from her PCP, also need follow-up for CBC, Hemoccult test. Recommended to follow up at [facility] within the next 10-14 days for hospital follow-up    Hypertension:Restart antihypertensives   HLD (hyperlipidemia):Restart statin   Morbid obesity: Patient meets criteria with BMI greater than 40  Day of Discharge BP 136/86 mmHg  Pulse 57  Temp(Src) 97.6 F (36.4 C) (Oral)  Resp 17  Ht 5\' 6"  (1.676 m)  Wt 114 kg (251 lb 5.2 oz)  BMI 40.58 kg/m2  SpO2 100%  Physical Exam: General: Alert and awake oriented x3 not in any acute distress. HEENT: anicteric sclera, pupils reactive to light and accommodation CVS: S1-S2 clear no murmur rubs or gallops Chest: clear to auscultation bilaterally, no wheezing rales or rhonchi Abdomen: soft nontender, nondistended, normal bowel sounds Extremities: no cyanosis, clubbing or edema noted bilaterally Neuro: Cranial nerves II-XII intact, no focal neurological deficits   The results of significant diagnostics from this hospitalization (including imaging, microbiology, ancillary and laboratory) are listed below for reference.    LAB RESULTS: Basic Metabolic Panel:  Recent Labs Lab 04/25/14 2002 04/26/14 0535  NA 137 136  K 3.5 3.3*  CL 106 105  CO2 19 24  GLUCOSE 108* 98  BUN 9 9  CREATININE 1.11* 1.01  CALCIUM 9.3 8.8   Liver Function Tests:  Recent Labs Lab 04/25/14 2002  AST 19  ALT 12  ALKPHOS 84  BILITOT 0.5  PROT 7.4  ALBUMIN 3.7   No results for  input(s): LIPASE, AMYLASE in the last 168 hours. No results for input(s): AMMONIA in the last 168 hours. CBC:  Recent Labs Lab 04/25/14 2002  04/27/14 0513 04/27/14 2133 04/28/14 0700  WBC 8.8  < > 8.3  --  9.0  NEUTROABS 6.6  --   --   --   --   HGB 7.8*  < > 7.3* 9.9* 9.3*  HCT 26.0*  < > 24.2* 31.4* 29.3*  MCV 79.8  < > 79.3  --  81.4  PLT 476*  < > 469*  --  426*  < > = values in this interval not displayed. Cardiac Enzymes:  Recent Labs Lab 04/25/14 2347  TROPONINI <0.03   BNP: Invalid input(s): POCBNP CBG:  Recent Labs Lab 04/26/14 1157 04/27/14 0810  GLUCAP 88 117*    Significant Diagnostic Studies:  No results found.  2D ECHO:   Disposition and Follow-up:     Discharge Instructions  Diet - low sodium heart healthy    Complete by:  As directed      Increase activity slowly    Complete by:  As directed             DISPOSITION: home   DISCHARGE FOLLOW-UP Follow-up Information    Follow up with [facility]    . Schedule an appointment as soon as possible for a visit in 10 days.   Why:  for hospital follow-up, obtain labs CBC, stool occult test    Contact information:   [address] [place] [address] [phone]       Time spent on Discharge: 36 mins  Signed:   RAI,RIPUDEEP M.D. [facility] 04/28/2014, 11:57 AM Pager: [phone]

## 2014-04-28 NOTE — Care Management Note (Addendum)
CARE MANAGEMENT NOTE 04/28/2014  Patient:  Peggy House, Peggy House   Account Number:  [id number]  Date Initiated:  04/28/2014  Documentation initiated by:  Elsie Baynes  Subjective/Objective Assessment:   CM following for progression and d/c planning.     Action/Plan:   Met with pt re followup with PCP, multiple attempts to reach pt PCP to schedule an appointment, able to reach Dr Kennon Holter and  appointment scheduled for 2pm May 04, 2014, for CBC and hospital followup.  The office will contact this pt , CM will also call the pt.   Anticipated DC Date:  04/28/2014   Anticipated DC Plan:  HOME/SELF CARE         Choice offered to / List presented to:             Status of service:  Completed, signed off Medicare Important Message given?  NO (If response is "NO", the following Medicare IM given date fields will be blank) Date Medicare IM given:   Medicare IM given by:   Date Additional Medicare IM given:   Additional Medicare IM given by:    Discharge Disposition:  HOME/SELF CARE  Per UR Regulation:    If discussed at Long Length of Stay Meetings, dates discussed:    Comments:

## 2014-04-28 NOTE — Progress Notes (Signed)
Patient's colonoscopy was negative for any source of anemia. She did have multiple diminutive polyps, and I will take care of contacting her with those results.  It is evident that the patient is no longer bleeding, since there was no blood in the colonic lumen. Therefore, I think discharge (from the GI tract standpoint) is okay at this time.  I have ordered an diet for the patient.  I think it would be good for her to go home on a PPI for at least a couple of months, even though it is not clear that the bleeding lesion in her stomach yesterday was a peptic ulceration; if anything, it probably looked more like a small AVM.  The main issue with this patient will be follow-up. She sees Dr. Lindwood Qua, who is in part-time practice. She will need careful monitoring of her hemoglobin, her Hemoccult status, and she should probably be given iron supplementation. She should probably be seen within one to 2 weeks following hospital discharge.  Because the patient has [place] access Medicaid, she will need a referral before I can see her back in follow-up, but I would be happy to see her if her primary physician feels it is needed, for example, if she is showing signs of ongoing blood loss.  Above discussed with Dr. Tana Coast.  I will sign off at this time. Please call if I can be of further assistance with this patient.  Cleotis Nipper, M.D. [phone]

## 2014-04-28 NOTE — Op Note (Signed)
[facility] [place] [place], [id number]   COLONOSCOPY PROCEDURE REPORT  PATIENT: Peggy House, Peggy House  MR#: [id number] BIRTHDATE: [birth year] , 53  yrs. 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 MEDICATIONS: MAC  DESCRIPTION OF PROCEDURE:   After the risks and benefits and of the procedure were explained, informed consent was obtained. The patient was brought from her hospital room to the [facility] endoscopy unit. Digital exam showed a rather capacious rectal ampulla.         The Pentax Adult Colon [id number]  endoscope was introduced through the anus and advanced to the terminal ileum .  The quality of the prep was excellent      .  The instrument was then slowly withdrawn as the colon was fully examined.   No blood was seen within the colonic lumen.        The cecum was identified by visualization of the appendiceal orifice and ileal cecal valve. The terminal ileum was entered for a short distance and appeared normal.  In the descending colon and sigmoid region, I encountered multiple smooth sessile polyps, typically 3-5 mm in size. The largest of these was snared off by cold snare technique and retrieved successfully. Many of the others were cold biopsied. these polyps had a hyperplastic appearance, and as a consequence, and taking into account their large number, I did not attempt to remove all of them.  No large polyps, masses, colitis, vascular malformations, or diverticular disease were observed.  Retroflexion in the rectum was normal.          The scope was then withdrawn from the patient and the procedure completed.  WITHDRAWAL TIME:  COMPLICATIONS: There were no immediate complications.   ENDOSCOPIC IMPRESSION: 1. No  active bleeding or blood in the colonic lumen identified at the time of this exam 2. No source of iron deficiency anemia evident on current examination 3. No vascular ectasia to correlate with that seen in the upper GI tract 4. Multiple diminutive or small sessile polyps in the distal colon, suggestive of hyperplastic polyps, many sample but not all removed  RECOMMENDATIONS: 1. Await pathology on polyps. If adenomatous, the patient would need surveillance colonoscopy in 3-5 years 2. Okay for discharge from the GI tract standpoint. The patient will need careful monitoring of her hemoglobin and Hemoccult status through her primary physician's office. I will be on standby if they determine that ongoing bleeding is occurring  REPEAT EXAM: to be determined  cc:  _______________________________ eSignedRonald Lobo, MD 05/18/2014 10:29 AM   CPT CODES: ICD CODES:  The ICD and CPT codes recommended by this software are interpretations from the data that the clinical staff has captured with the software.  The verification of the translation of this report to the ICD and CPT codes and modifiers is the sole responsibility of the health care institution and practicing physician where this report was generated.  [employer] will not be held responsible for the validity of the ICD and CPT codes included on this report.  AMA assumes no liability for data contained or not contained herein. C[employer] is a Designer, television/film set of the [employer].   PATIENT NAME:  Jenevieve, Kirschbaum MR#: [id number]

## 2014-04-28 NOTE — Anesthesia Postprocedure Evaluation (Signed)
  Anesthesia Post-op Note  Patient: Peggy House  Procedure(s) Performed: Procedure(s): COLONOSCOPY WITH PROPOFOL (N/A)  Patient Location: Endoscopy Unit  Anesthesia Type:MAC  Level of Consciousness: awake and alert   Airway and Oxygen Therapy: Patient Spontanous Breathing  Post-op Pain: none  Post-op Assessment: Post-op Vital signs reviewed, Patient's Cardiovascular Status Stable, Respiratory Function Stable, Patent Airway, No signs of Nausea or vomiting and Pain level controlled  Post-op Vital Signs: Reviewed and stable  Last Vitals:  Filed Vitals:   04/28/14 1040  BP: 136/86  Pulse: 57  Temp:   Resp: 17    Complications: No apparent anesthesia complications

## 2014-04-28 NOTE — Interval H&P Note (Signed)
History and Physical Interval Note:  04/28/2014 9:02 AM  Peggy House  has presented today for surgery, with the diagnosis of anemia  The various methods of treatment have been discussed with the patient. After consideration of risks, benefits and other options for treatment, the patient has consented to  Procedure(s): COLONOSCOPY WITH PROPOFOL (N/A) as a surgical intervention .  The patient's history has been reviewed, patient examined, no change in status, stable for surgery.  I have reviewed the patient's chart and labs.  Questions were answered to the patient's satisfaction.     Cleotis Nipper

## 2014-04-28 NOTE — Anesthesia Preprocedure Evaluation (Addendum)
Anesthesia Evaluation  Patient identified by MRN, date of birth, ID band Patient awake    Reviewed: Allergy & Precautions, NPO status , Patient's Chart, lab work & pertinent test results, reviewed documented beta blocker date and time   History of Anesthesia Complications Negative for: history of anesthetic complications  Airway Mallampati: III  TM Distance: >3 FB Neck ROM: Full    Dental  (+) Teeth Intact, Missing, Dental Advisory Given,    Pulmonary neg COPDRecent URI , Current Smoker,  breath sounds clear to auscultation        Cardiovascular hypertension, Pt. on medications and Pt. on home beta blockers - angina- Past MI and - CHF - dysrhythmias - Valvular Problems/MurmursRhythm:Regular     Neuro/Psych negative neurological ROS  negative psych ROS   GI/Hepatic Neg liver ROS, GERD-  Medicated and Controlled,  Endo/Other  Morbid obesity  Renal/GU negative Renal ROS     Musculoskeletal   Abdominal   Peds  Hematology  (+) anemia ,   Anesthesia Other Findings   Reproductive/Obstetrics                            Anesthesia Physical Anesthesia Plan  ASA: II  Anesthesia Plan: MAC   Post-op Pain Management:    Induction: Intravenous  Airway Management Planned: Simple Face Mask and Natural Airway  Additional Equipment: None  Intra-op Plan:   Post-operative Plan:   Informed Consent: I have reviewed the patients History and Physical, chart, labs and discussed the procedure including the risks, benefits and alternatives for the proposed anesthesia with the patient or authorized representative who has indicated his/her understanding and acceptance.   Dental advisory given  Plan Discussed with: CRNA and Surgeon  Anesthesia Plan Comments:         Anesthesia Quick Evaluation

## 2014-04-28 NOTE — Progress Notes (Signed)
Spoke with Dr. Rai-pt to take current home dose of iron. Disregard new prescribed.  Went over discharge instructions and medications with patient.  Prescription given to patient.  All questions answered.

## 2014-04-29 ENCOUNTER — Encounter (HOSPITAL_COMMUNITY): Payer: Self-pay | Admitting: Gastroenterology

## 2015-05-20 ENCOUNTER — Encounter (HOSPITAL_COMMUNITY): Payer: Self-pay | Admitting: *Deleted

## 2015-05-20 ENCOUNTER — Emergency Department (HOSPITAL_COMMUNITY)
Admission: EM | Admit: 2015-05-20 | Discharge: 2015-05-20 | Disposition: A | Discharge status: Home / Self Care | Payer: Medicaid Other | Source: Home / Self Care | Attending: Emergency Medicine | Admitting: Emergency Medicine

## 2015-05-20 DIAGNOSIS — K219 Gastro-esophageal reflux disease without esophagitis: Secondary | ICD-10-CM

## 2015-05-20 DIAGNOSIS — F419 Anxiety disorder, unspecified: Secondary | ICD-10-CM | POA: Diagnosis not present

## 2015-05-20 DIAGNOSIS — I10 Essential (primary) hypertension: Secondary | ICD-10-CM

## 2015-05-20 HISTORY — DX: Hyperlipidemia, unspecified: E78.5

## 2015-05-20 HISTORY — DX: Gastro-esophageal reflux disease without esophagitis: K21.9

## 2015-05-20 MED ORDER — FUROSEMIDE 40 MG PO TABS: 40.0000 mg | ORAL_TABLET | Freq: Every day | ORAL | Status: DC

## 2015-05-20 MED ORDER — ALPRAZOLAM 0.5 MG PO TABS: 0.5000 mg | ORAL_TABLET | Freq: Two times a day (BID) | ORAL | Status: DC | PRN

## 2015-05-20 MED ORDER — AMLODIPINE BESYLATE 10 MG PO TABS
10.0000 mg | ORAL_TABLET | Freq: Every day | ORAL | Status: DC
Start: 2015-05-20 — End: 2015-06-05

## 2015-05-20 MED ORDER — OMEPRAZOLE 20 MG PO CPDR: 20.0000 mg | DELAYED_RELEASE_CAPSULE | Freq: Every day | ORAL | Status: DC

## 2015-05-20 MED ORDER — METOPROLOL TARTRATE 50 MG PO TABS: 50.0000 mg | ORAL_TABLET | Freq: Every day | ORAL | Status: DC

## 2015-05-20 MED ORDER — FUSION PLUS PO CAPS: 1.0000 | ORAL_CAPSULE | Freq: Every day | ORAL | Status: DC

## 2015-05-20 NOTE — Discharge Instructions (Signed)
I have provided a one-month supply of your medications. Please follow-up with your new doctor as scheduled March 1.

## 2015-05-20 NOTE — ED Notes (Signed)
Reports running out of all meds the first week of 05-03-22 after her PCP passed away.  Has an appt with a new PCP on 3/1.

## 2015-05-20 NOTE — ED Provider Notes (Signed)
CSN: [id number]     Arrival date & time 05/20/15  1422 History   First MD Initiated Contact with Patient 05/20/15 1500     Chief Complaint  Patient presents with  . Medication Refill   (Consider location/radiation/quality/duration/timing/severity/associated sxs/prior Treatment) HPI She is a 55 year old woman here for medication refill. Her physician died recently. She does have an appointment with a new physician, but not until March 1. She has been out of all of her medications for the last 3-4 weeks. She does have her pill bottles with her. She denies any chest pain or shortness of breath. She states she does sometimes feel a little "swimmy headed." She denies frank dizziness.  She does take Xanax for anxiety. She states she has been struggling without this medication with increased anxiety and difficulty sleeping.  Past Medical History  Diagnosis Date  . Hypertension   . Anxiety   . History of swelling of feet   . Snores   . Arthritis     knnes,back  . GERD (gastroesophageal reflux disease)   . Hyperlipidemia    Past Surgical History  Procedure Laterality Date  . Abdominal hysterectomy    . L shoulder surgery  2011  . Carpal tunnel release  05/13/2011    Procedure: CARPAL TUNNEL RELEASE;  Surgeon: Nita Sells, MD;  Location: [place];  Service: Orthopedics;  Laterality: Left;  . Dilation and curettage of uterus    . Dg toes*l*  2/10    rt  . Esophagogastroduodenoscopy N/A 04/10/2014    Procedure: ESOPHAGOGASTRODUODENOSCOPY (EGD);  Surgeon: [name], MD;  Location[facility] ENDOSCOPY;  Service: Endoscopy;  Laterality: N/A;  . Esophagogastroduodenoscopy (egd) with propofol N/A 04/27/2014    Procedure: ESOPHAGOGASTRODUODENOSCOPY (EGD) WITH PROPOFOL;  Surgeon: Cleotis Nipper, MD;  Location: [facility] ENDOSCOPY;  Service: Endoscopy;  Laterality: N/A;  possible apc  . Hot hemostasis N/A 04/27/2014    Procedure: HOT HEMOSTASIS (ARGON PLASMA COAGULATION/BICAP);   Surgeon: Cleotis Nipper, MD;  Location: [facility] ENDOSCOPY;  Service: Endoscopy;  Laterality: N/A;  . Colonoscopy with propofol N/A 04/28/2014    Procedure: COLONOSCOPY WITH PROPOFOL;  Surgeon: Cleotis Nipper, MD;  Location: [name];  Service: Endoscopy;  Laterality: N/A;   Family History  Problem Relation Age of Onset  . Cancer Mother   . Ovarian cancer Mother   . Diabetes type II Sister   . Dysmenorrhea Neg Hx   . Diabetes Sister   . Colon cancer Maternal Grandfather   . Stomach cancer Maternal Grandmother    Social History  Substance Use Topics  . Smoking status: Current Every Day Smoker -- 0.50 packs/day for 30 years    Types: Cigarettes  . Smokeless tobacco: Never Used  . Alcohol Use: No   OB History    No data available     Review of Systems As in history of present illness Allergies  Nsaids and Tomato  Home Medications   Prior to Admission medications   Medication Sig Start Date End Date Taking? Authorizing Provider  ALPRAZolam Duanne Moron) 0.5 MG tablet Take 1 tablet (0.5 mg total) by mouth 2 (two) times daily as needed for anxiety. 05/20/15   Melony Overly, MD  amLODipine (NORVASC) 10 MG tablet Take 1 tablet (10 mg total) by mouth daily. 05/20/15   Melony Overly, MD  furosemide (LASIX) 40 MG tablet Take 1 tablet (40 mg total) by mouth daily. 05/20/15   Melony Overly, MD  Iron-FA-B Cmp-C-Biot-Probiotic (FUSION PLUS) CAPS  Take 1 capsule by mouth daily. 05/20/15   Melony Overly, MD  metoprolol (LOPRESSOR) 50 MG tablet Take 1 tablet (50 mg total) by mouth daily. 05/20/15   Melony Overly, MD  omeprazole (PRILOSEC) 20 MG capsule Take 1 capsule (20 mg total) by mouth daily. 05/20/15   Melony Overly, MD   Meds Ordered and Administered this Visit  Medications - No data to display  BP 144/79 mmHg  Pulse 66  Temp(Src) 98.9 F (37.2 C) (Oral)  Resp 18  SpO2 99% No data found.   Physical Exam  Constitutional: She is oriented to person, place, and time. She appears well-developed and  well-nourished. No distress.  Neck: Neck supple.  Cardiovascular: Normal rate, regular rhythm and normal heart sounds.   No murmur heard. Pulmonary/Chest: Effort normal and breath sounds normal. No respiratory distress. She has no wheezes. She has no rales.  Neurological: She is alert and oriented to person, place, and time.    ED Course  Procedures (including critical care time)  Labs Review Labs Reviewed - No data to display  Imaging Review No results found.   MDM   1. Essential hypertension   2. Gastroesophageal reflux disease, esophagitis presence not specified   3. Anxiety    I have given her a 30 day supply of all of her medications. She will follow-up with her new PCP as scheduled on March 1.    Melony Overly, MD 05/20/15 [phone]

## 2015-06-05 ENCOUNTER — Encounter: Payer: Self-pay | Admitting: Internal Medicine

## 2015-06-05 ENCOUNTER — Ambulatory Visit (INDEPENDENT_AMBULATORY_CARE_PROVIDER_SITE_OTHER): Payer: Medicaid Other | Admitting: Internal Medicine

## 2015-06-05 VITALS — BP 118/75 | HR 80 | Temp 98.0°F | Ht 66.0 in | Wt 265.8 lb

## 2015-06-05 DIAGNOSIS — I10 Essential (primary) hypertension: Secondary | ICD-10-CM

## 2015-06-05 DIAGNOSIS — F329 Major depressive disorder, single episode, unspecified: Secondary | ICD-10-CM

## 2015-06-05 DIAGNOSIS — F331 Major depressive disorder, recurrent, moderate: Secondary | ICD-10-CM

## 2015-06-05 DIAGNOSIS — M150 Primary generalized (osteo)arthritis: Secondary | ICD-10-CM

## 2015-06-05 DIAGNOSIS — F339 Major depressive disorder, recurrent, unspecified: Secondary | ICD-10-CM | POA: Insufficient documentation

## 2015-06-05 DIAGNOSIS — F419 Anxiety disorder, unspecified: Secondary | ICD-10-CM

## 2015-06-05 DIAGNOSIS — M159 Polyosteoarthritis, unspecified: Secondary | ICD-10-CM | POA: Insufficient documentation

## 2015-06-05 DIAGNOSIS — Q2733 Arteriovenous malformation of digestive system vessel: Secondary | ICD-10-CM | POA: Diagnosis not present

## 2015-06-05 DIAGNOSIS — K31819 Angiodysplasia of stomach and duodenum without bleeding: Secondary | ICD-10-CM

## 2015-06-05 DIAGNOSIS — M8949 Other hypertrophic osteoarthropathy, multiple sites: Secondary | ICD-10-CM

## 2015-06-05 DIAGNOSIS — D62 Acute posthemorrhagic anemia: Secondary | ICD-10-CM | POA: Diagnosis not present

## 2015-06-05 DIAGNOSIS — Z6841 Body Mass Index (BMI) 40.0 and over, adult: Secondary | ICD-10-CM | POA: Diagnosis not present

## 2015-06-05 DIAGNOSIS — G4700 Insomnia, unspecified: Secondary | ICD-10-CM | POA: Insufficient documentation

## 2015-06-05 DIAGNOSIS — E785 Hyperlipidemia, unspecified: Secondary | ICD-10-CM

## 2015-06-05 HISTORY — DX: Major depressive disorder, recurrent, unspecified: F33.9

## 2015-06-05 MED ORDER — AMLODIPINE BESYLATE 10 MG PO TABS: 10.0000 mg | ORAL_TABLET | Freq: Every day | ORAL | Status: DC

## 2015-06-05 MED ORDER — METOPROLOL SUCCINATE ER 100 MG PO TB24: 100.0000 mg | ORAL_TABLET | Freq: Every day | ORAL | Status: DC

## 2015-06-05 MED ORDER — PANTOPRAZOLE SODIUM 40 MG PO TBEC: 40.0000 mg | DELAYED_RELEASE_TABLET | Freq: Every day | ORAL | Status: DC

## 2015-06-05 MED ORDER — CITALOPRAM HYDROBROMIDE 20 MG PO TABS: 20.0000 mg | ORAL_TABLET | Freq: Every day | ORAL | Status: DC

## 2015-06-05 MED ORDER — ALPRAZOLAM 0.5 MG PO TABS: 0.5000 mg | ORAL_TABLET | Freq: Two times a day (BID) | ORAL | Status: DC | PRN

## 2015-06-05 NOTE — Assessment & Plan Note (Signed)
HPI: Reports she has been taking Xanax 0.5mg  BID for anxiety and insomnia.  This helps some but she remains anxious.  A: Anxiety  P: [place]-Narcotics database shows she has been regularyly filling 0.5mg  of Xanax bid.  I discussed thsi medication may help some for anxiety but especially given her depression we should start her on an SSRI - Start Celexa.

## 2015-06-05 NOTE — Assessment & Plan Note (Signed)
HPI: She was previously on a statin medication, she cannot remember which one.  A: HLD  P: Check Lipid panel - Will likely benefit from high intensity statin

## 2015-06-05 NOTE — Assessment & Plan Note (Signed)
HPI: She admits depression without SI. She reports low energy, decreased appetite, insomnia, and difficulty concentrating. She does admit to one previous suicide attempt by pill overdose when she was in her 66s due to a breakup with boyfriend.  A: MDD  P:  Start Celexa 20mg  daily.

## 2015-06-05 NOTE — Assessment & Plan Note (Signed)
HPI: Feels her strength is returning.  She has been taking her iron supplement daily.  A: Acute blood loss anemia/ Iron deficiency anemia  P: Repeat CBC today

## 2015-06-05 NOTE — Assessment & Plan Note (Signed)
A: Morbid obesity  P: Discussed the importance of weight loss to her overall health and especially her OA. -Referral for MNT.

## 2015-06-05 NOTE — Assessment & Plan Note (Signed)
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  Discharge summary from that admission recommended follow up with GI when she establishes with PCP.  A: Gastric AVM  P: Referral to GI -Change omeprazole back to pantoprazole.

## 2015-06-05 NOTE — Patient Instructions (Addendum)
General Instructions:   Please bring your medicines with you each time you come to clinic.  Medicines may include prescription medications, over-the-counter medications, herbal remedies, eye drops, vitamins, or other pills.   Progress Toward Treatment Goals:  No flowsheet data found.  Self Care Goals & Plans:  No flowsheet data found.  No flowsheet data found.   Care Management & Community Referrals:  No flowsheet data found.      Practice Good Sleep Hygiene Here are some suggestions Avoid napping during the day. It can disturb the normal pattern of sleep and wakefulness.  Avoid stimulants such as caffeine, nicotine, and alcohol too close to bedtime. While alcohol is well known to speed the onset of sleep, it disrupts sleep in the second half as the body begins to metabolize the alcohol, causing arousal.  Exercise can promote good sleep. Vigorous exercise should be taken in the morning or late afternoon. A relaxing exercise, like yoga, can be done before bed to help initiate a restful night's sleep. Food can be disruptive right before sleep. Stay away from large meals close to bedtime. Also dietary changes can cause sleep problems, if someone is struggling with a sleep problem, it's not a good time to start experimenting with spicy dishes. And, remember, chocolate has caffeine.  Ensure adequate exposure to natural light. This is particularly important for older people who may not venture outside as frequently as children and adults. Light exposure helps maintain a healthy sleep-wake cycle.  Establish a regular relaxing bedtime routine. Try to avoid emotionally upsetting conversations and activities before trying to go to sleep. Don't dwell on, or bring your problems to bed.  Associate your bed with sleep. It's not a good idea to use your bed to watch TV, listen to the radio, or read.  Make sure that the sleep environment is pleasant and relaxing. The bed should be comfortable, the  room should not be too hot or cold, or too bright.  Citalopram oral solution What is this medicine? CITALOPRAM (sye TAL oh pram) is used to treat depression. This medicine may be used for other purposes; ask your health care provider or pharmacist if you have questions. What should I tell my health care provider before I take this medicine? They need to know if you have any of these conditions: -bipolar disorder or a family history of bipolar disorder -diabetes -glaucoma -heart disease -history of irregular heartbeat -kidney or liver disease -low levels of magnesium or potassium in the blood -receiving electroconvulsive therapy -seizures (convulsions) -suicidal thoughts or a previous suicide attempt -an unusual or allergic reaction to citalopram, escitalopram, other medicines, foods, dyes, or preservatives -pregnant or trying to become pregnant -breast-feeding How should I use this medicine? Take this medicine by mouth. Follow the directions on the prescription label. Use a specially marked spoon or container to measure your medicine. Ask your pharmacist if you do not have one. Household spoons are not accurate. This medicine can be taken with or without food. Take your medicine at regular intervals. Do not take your medicine more often than directed. Do not stop taking this medicine suddenly except upon the advice of your doctor. Stopping this medicine too quickly may cause serious side effects or your condition may worsen. A special MedGuide will be given to you by the pharmacist with each prescription and refill. Be sure to read this information carefully each time. Talk to your pediatrician regarding the use of this medicine in children. Special care may be needed. Patients over  38 years old may have a stronger reaction and need a smaller dose. Overdosage: If you think you have taken too much of this medicine contact a poison control center or emergency room at once. NOTE: This  medicine is only for you. Do not share this medicine with others. What if I miss a dose? If you miss a dose, take it as soon as you can. If it is almost time for your next dose, take only that dose. Do not take double or extra doses. What may interact with this medicine? Do not take this medicine with any of the following medications: -certain medicines for fungal infections like fluconazole, itraconazole, ketoconazole, posaconazole, voriconazole -cisapride -dofetilide -dronedarone -escitalopram -linezolid -MAOIs like Carbex, Eldepryl, Marplan, Nardil, and Parnate -methylene blue (injected into a vein) -pimozide -thioridazine -ziprasidone This medicine may also interact with the following medications: -alcohol -aspirin and aspirin-like medicines -carbamazepine -certain medicines for depression, anxiety, or psychotic disturbances -certain medicines for infections like chloroquine, clarithromycin, erythromycin, furazolidone, isoniazid, pentamidine -certain medicines for migraine headaches like almotriptan, eletriptan, frovatriptan, naratriptan, rizatriptan, sumatriptan, zolmitriptan -certain medicines for sleep -certain medicines that treat or prevent blood clots like dalteparin, enoxaparin, warfarin -cimetidine -diuretics -fentanyl -lithium -methadone -metoprolol -NSAIDs, medicines for pain and inflammation, like ibuprofen or naproxen -omeprazole -other medicines that prolong the QT interval (cause an abnormal heart rhythm) -procarbazine -rasagiline -supplements like St. John's wort, kava kava, valerian -tramadol -tryptophan This list may not describe all possible interactions. Give your health care provider a list of all the medicines, herbs, non-prescription drugs, or dietary supplements you use. Also tell them if you smoke, drink alcohol, or use illegal drugs. Some items may interact with your medicine. What should I watch for while using this medicine? Tell your doctor if  your symptoms do not get better or if they get worse. Visit your doctor or health care professional for regular checks on your progress. Because it may take several weeks to see the full effects of this medicine, it is important to continue your treatment as prescribed by your doctor. Patients and their families should watch out for new or worsening thoughts of suicide or depression. Also watch out for sudden changes in feelings such as feeling anxious, agitated, panicky, irritable, hostile, aggressive, impulsive, severely restless, overly excited and hyperactive, or not being able to sleep. If this happens, especially at the beginning of treatment or after a change in dose, call your health care professional. Dennis Bast may get drowsy or dizzy. Do not drive, use machinery, or do anything that needs mental alertness until you know how this medicine affects you. Do not stand or sit up quickly, especially if you are an older patient. This reduces the risk of dizzy or fainting spells. Alcohol may interfere with the effect of this medicine. Avoid alcoholic drinks. Your mouth may get dry. Chewing sugarless gum or sucking hard candy, and drinking plenty of water will help. Contact your doctor if the problem does not go away or is severe. What side effects may I notice from receiving this medicine? Side effects that you should report to your doctor or health care professional as soon as possible: -allergic reactions like skin rash, itching or hives, swelling of the face, lips, or tongue -chest pain -confusion -dizziness -fast, irregular heartbeat -fast talking and excited feelings or actions that are out of control -feeling faint or lightheaded, falls -hallucination, loss of contact with reality -seizures -shortness of breath -suicidal thoughts or other mood changes -unusual bleeding or bruising Side effects that  usually do not require medical attention (report to your doctor or health care professional if they  continue or are bothersome): -blurred vision -change in appetite -change in sex drive or performance -headache -increased sweating -nausea -trouble sleeping This list may not describe all possible side effects. Call your doctor for medical advice about side effects. You may report side effects to FDA at 1-800-FDA-1088. Where should I keep my medicine? Keep out of reach of children. Store at room temperature between 15 and 30 degrees C (59 and 86 degrees F). Throw away any unused medicine after the expiration date. NOTE: This sheet is a summary. It may not cover all possible information. If you have questions about this medicine, talk to your doctor, pharmacist, or health care provider.    2016, Elsevier/Gold Standard. (2012-10-23 13:10:20)

## 2015-06-05 NOTE — Assessment & Plan Note (Signed)
HPI: Takes amlodipine 10mg  daily and Metoprolol tartrate 50mg  once daily  A: Essential HTN  P: -Continue amlodipine - unclear why she is on Metoprolol tartrate 50mg  once daily, will obtain records but I will go ahead and change this to metoprolol suscitate 100mg  daily.

## 2015-06-05 NOTE — Assessment & Plan Note (Addendum)
HPI: She reports 10/10 pain in her left knee that is chronic, she also notes bilateral shoulder pain, low back pain.  She has seen orthopaedics in the past and received regular steroid injections into her joints.  She reports they have told her she will need a knee replacement at some point.  She notes that since not having the injections in the last 3-4 months she has returned to using some occasional ibuprofen for the pain which she has been instructed not to do.  A: OA of multiple sites  P: Referral to Orthopaedics -Instructed to avoid NSAIDs.

## 2015-06-05 NOTE — Progress Notes (Signed)
[facility] Subjective:   Patient ID: MELISSASUE REINHOLD female   DOB: [birth year] 55 y.o.   MRN: [id number]  HPI: Ms.Marguerite E [name] is a 55 y.o. female with a PMH detailed below who presents to establish care.  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  She was placed on protonix daily and iron supplementation.  She also was seen in urgent care on 05/19/14 for medication refill.  Please see problem based charting below for the status of her chronic medical problems.    Past Medical History  Diagnosis Date  . Hypertension   . Anxiety   . History of swelling of feet   . Snores   . Arthritis     knnes,back  . GERD (gastroesophageal reflux disease)   . Hyperlipidemia    Current Outpatient Prescriptions  Medication Sig Dispense Refill  . ALPRAZolam (XANAX) 0.5 MG tablet Take 1 tablet (0.5 mg total) by mouth 2 (two) times daily as needed for anxiety. 60 tablet 0  . amLODipine (NORVASC) 10 MG tablet Take 1 tablet (10 mg total) by mouth daily. 30 tablet 0  . furosemide (LASIX) 40 MG tablet Take 1 tablet (40 mg total) by mouth daily. 30 tablet 0  . Iron-FA-B Cmp-C-Biot-Probiotic (FUSION PLUS) CAPS Take 1 capsule by mouth daily. 30 capsule 0  . metoprolol (LOPRESSOR) 50 MG tablet Take 1 tablet (50 mg total) by mouth daily. 30 tablet 0  . omeprazole (PRILOSEC) 20 MG capsule Take 1 capsule (20 mg total) by mouth daily. 30 capsule 0  . [DISCONTINUED] pantoprazole (PROTONIX) 40 MG tablet Take 1 tablet (40 mg total) by mouth daily. 30 tablet 0  . [DISCONTINUED] pravastatin (PRAVACHOL) 40 MG tablet Take 40 mg by mouth daily.     No current facility-administered medications for this visit.   Family History  Problem Relation Age of Onset  . Cancer Mother   . Ovarian cancer Mother   . Diabetes type II Sister   . Dysmenorrhea Neg Hx   . Diabetes  Sister   . Colon cancer Maternal Grandfather   . Stomach cancer Maternal Grandmother    Social History   Social History  . Marital Status: Single    Spouse Name: N/A  . Number of Children: N/A  . Years of Education: N/A   Social History Main Topics  . Smoking status: Current Every Day Smoker -- 0.50 packs/day for 30 years    Types: Cigarettes  . Smokeless tobacco: Never Used     Comment: 1/2 PPD  . Alcohol Use: No  . Drug Use: No     Comment: last cocaine-2010  . Sexual Activity: Not Asked   Other Topics Concern  . None   Social History Narrative   Review of Systems: Review of Systems  Constitutional: Negative for fever and chills.  Eyes: Negative for blurred vision.  Respiratory: Negative for cough and shortness of breath.   Cardiovascular: Negative for chest pain.  Gastrointestinal: Negative for heartburn, abdominal pain, blood in stool and melena.  Genitourinary: Negative for dysuria.  Musculoskeletal: Negative for myalgias.  Neurological: Negative for dizziness and headaches.  All other systems reviewed and are negative.    Objective:  Physical Exam: Filed Vitals:   06/05/15 1448  BP: 118/75  Pulse: 80  Temp: 98 F (36.7 C)  TempSrc: Oral  Height: 5\' 6"  (  1.676 m)  Weight: 265 lb 12.8 oz (120.566 kg)  SpO2: 100%  Physical Exam  Constitutional: She is oriented to person, place, and time and well-developed, well-nourished, and in no distress.  HENT:  Mouth/Throat: Oropharynx is clear and moist.  Eyes: Conjunctivae are normal. Pupils are equal, round, and reactive to light.  Cardiovascular: Normal rate and regular rhythm.   Pulmonary/Chest: Effort normal and breath sounds normal. No respiratory distress. She has no wheezes. She has no rales.  Abdominal: Soft. Bowel sounds are normal. There is no tenderness.  Musculoskeletal:       Right knee: She exhibits normal range of motion and no swelling. Tenderness found. Medial joint line tenderness noted. No  lateral joint line tenderness noted.       Left knee: She exhibits normal range of motion, no swelling and no effusion. Tenderness found. Medial joint line and lateral joint line tenderness noted.  Neurological: She is alert and oriented to person, place, and time.  Skin: Skin is warm and dry.  Nursing note and vitals reviewed.   Assessment & Plan:  Case discussed with Dr. Dareen Piano  Gastric AVM HPI: Admitted in Jan for GI bleed felt to be due to gastric AVM.  Discharge summary from that admission recommended follow up with GI when she establishes with PCP.  A: Gastric AVM  P: Referral to GI -Change omeprazole back to pantoprazole.  Essential hypertension HPI: Takes amlodipine 10mg  daily and Metoprolol tartrate 50mg  once daily  A: Essential HTN  P: -Continue amlodipine - unclear why she is on Metoprolol tartrate 50mg  once daily, will obtain records but I will go ahead and change this to metoprolol suscitate 100mg  daily.  Osteoarthritis, multiple sites HPI: She reports 10/10 pain in her left knee that is chronic, she also notes bilateral shoulder pain, low back pain.  She has seen orthopaedics in the past and received regular steroid injections into her joints.  She reports they have told her she will need a knee replacement at some point.  She notes that since not having the injections in the last 3-4 months she has returned to using some occasional ibuprofen for the pain which she has been instructed not to do.  A: OA of multiple sites  P: Referral to Orthopaedics -Instructed to avoid NSAIDs.  Morbid obesity A: Morbid obesity  P: Discussed the importance of weight loss to her overall health and especially her OA. -Referral for MNT.  Acute blood loss anemia HPI: Feels her strength is returning.  She has been taking her iron supplement daily.  A: Acute blood loss anemia/ Iron deficiency anemia  P: Repeat CBC today  Anxiety HPI: Reports she has been taking Xanax 0.5mg   BID for anxiety and insomnia.  This helps some but she remains anxious.  A: Anxiety  P: [place]-Narcotics database shows she has been regularyly filling 0.5mg  of Xanax bid.  I discussed thsi medication may help some for anxiety but especially given her depression we should start her on an SSRI - Start Celexa.  HLD (hyperlipidemia) HPI: She was previously on a statin medication, she cannot remember which one.  A: HLD  P: Check Lipid panel - Will likely benefit from high intensity statin  Major depressive disorder, recurrent episode (HCC) HPI: She admits depression without SI. She reports low energy, decreased appetite, insomnia, and difficulty concentrating. She does admit to one previous suicide attempt by pill overdose when she was in her 35s due to a breakup with boyfriend.  A: MDD  P:  Start Celexa 20mg  daily.    Medications Ordered Meds ordered this encounter  Medications  . metoprolol succinate (TOPROL-XL) 100 MG 24 hr tablet    Sig: Take 1 tablet (100 mg total) by mouth daily. Take with or immediately following a meal.    Dispense:  30 tablet    Refill:  11  . amLODipine (NORVASC) 10 MG tablet    Sig: Take 1 tablet (10 mg total) by mouth daily.    Dispense:  30 tablet    Refill:  11  . citalopram (CELEXA) 20 MG tablet    Sig: Take 1 tablet (20 mg total) by mouth daily.    Dispense:  30 tablet    Refill:  2  . pantoprazole (PROTONIX) 40 MG tablet    Sig: Take 1 tablet (40 mg total) by mouth daily.    Dispense:  30 tablet    Refill:  5  . ALPRAZolam (XANAX) 0.5 MG tablet    Sig: Take 1 tablet (0.5 mg total) by mouth 2 (two) times daily as needed for anxiety.    Dispense:  60 tablet    Refill:  1   Other Orders Orders Placed This Encounter  Procedures  . CBC no Diff  . BMP8+Anion Gap  . Lipid Profile  . AMB referral to orthopedics    Referral Priority:  Routine    Referral Type:  Consultation    Number of Visits Requested:  1  . Amb ref to Medical Nutrition  Therapy-MNT    Referral Priority:  Routine    Referral Type:  Consultation    Referral Reason:  Specialty Services Required    Requested Specialty:  Nutrition    Number of Visits Requested:  1  . Ambulatory referral to Gastroenterology    Referral Priority:  Routine    Referral Type:  Consultation    Referral Reason:  Specialty Services Required    Number of Visits Requested:  1   Follow Up: Return in about 3 months (around 09/02/2015), or if symptoms worsen or fail to improve.

## 2015-06-05 NOTE — Assessment & Plan Note (Signed)
>>  ASSESSMENT AND PLAN FOR ANXIETY WRITTEN ON 06/05/2015  6:31 PM BY HOFFMAN, ERIK C, DO  HPI: Reports she has been taking Xanax  0.5mg  BID for anxiety and insomnia.  This helps some but she remains anxious.  A: Anxiety  P: [place]-Narcotics database shows she has been regularyly filling 0.5mg  of Xanax  bid.  I discussed thsi medication may help some for anxiety but especially given her depression we should start her on an SSRI - Start Celexa .

## 2015-06-06 ENCOUNTER — Telehealth: Payer: Self-pay | Admitting: *Deleted

## 2015-06-06 LAB — CBC
Hematocrit: 34.3 % (ref 34.0–46.6)
Hemoglobin: 10.7 g/dL — ABNORMAL LOW (ref 11.1–15.9)
MCH: 23.2 pg — ABNORMAL LOW (ref 26.6–33.0)
MCHC: 31.2 g/dL — ABNORMAL LOW (ref 31.5–35.7)
MCV: 74 fL — AB (ref 79–97)
NRBC: 0 % (ref 0–0)
PLATELETS: 346 10*3/uL (ref 150–379)
RBC: 4.61 x10E6/uL (ref 3.77–5.28)
RDW: 19.6 % — ABNORMAL HIGH (ref 12.3–15.4)
WBC: 16.5 10*3/uL — ABNORMAL HIGH (ref 3.4–10.8)

## 2015-06-06 LAB — BMP8+ANION GAP
ANION GAP: 20 mmol/L — AB (ref 10.0–18.0)
BUN/Creatinine Ratio: 11 (ref 9–23)
BUN: 9 mg/dL (ref 6–24)
CALCIUM: 9.4 mg/dL (ref 8.7–10.2)
CHLORIDE: 103 mmol/L (ref 96–106)
CO2: 21 mmol/L (ref 18–29)
Creatinine, Ser: 0.83 mg/dL (ref 0.57–1.00)
GFR calc Af Amer: 92 mL/min/{1.73_m2} (ref >59)
GFR calc non Af Amer: 80 mL/min/{1.73_m2} (ref >59)
Glucose: 71 mg/dL (ref 65–99)
POTASSIUM: 4.2 mmol/L (ref 3.5–5.2)
Sodium: 144 mmol/L (ref 134–144)

## 2015-06-06 LAB — LIPID PANEL
CHOLESTEROL TOTAL: 295 mg/dL — AB (ref 100–199)
Chol/HDL Ratio: 7.4 ratio units — ABNORMAL HIGH (ref 0.0–4.4)
HDL: 40 mg/dL (ref >39)
LDL Calculated: 229 mg/dL — ABNORMAL HIGH (ref 0–99)
TRIGLYCERIDES: 128 mg/dL (ref 0–149)
VLDL Cholesterol Cal: 26 mg/dL (ref 5–40)

## 2015-06-06 NOTE — Telephone Encounter (Signed)
Phone encounter opened in error. Yvonna Alanis, RN, 06/06/15-4:15 P

## 2015-06-06 NOTE — Telephone Encounter (Signed)
Telephone encounter opened in error. Yvonna Alanis, RN, 06/06/15, 2:14 PM

## 2015-06-06 NOTE — Telephone Encounter (Signed)
Phone encounter opened in error. Yvonna Alanis, RN, 2/21/1/7, 4:14 PM

## 2015-06-07 MED ORDER — ROSUVASTATIN CALCIUM 40 MG PO TABS: 40.0000 mg | ORAL_TABLET | Freq: Every day | ORAL | Status: DC

## 2015-06-07 NOTE — Progress Notes (Signed)
Internal Medicine Clinic Attending  Case discussed with Dr. Hoffman soon after the resident saw the patient.  We reviewed the resident's history and exam and pertinent patient test results.  I agree with the assessment, diagnosis, and plan of care documented in the resident's note. 

## 2015-06-07 NOTE — Addendum Note (Signed)
Addended by: Joni Reining C on: 06/07/2015 11:05 AM   Modules accepted: Orders

## 2015-06-12 ENCOUNTER — Telehealth: Payer: Self-pay | Admitting: Internal Medicine

## 2015-06-12 NOTE — Telephone Encounter (Signed)
APPT. REMINDER CALL, NO ANSWER, NO VOICE MAIL °

## 2015-06-13 ENCOUNTER — Ambulatory Visit (INDEPENDENT_AMBULATORY_CARE_PROVIDER_SITE_OTHER): Payer: Medicaid Other | Admitting: Dietician

## 2015-06-13 DIAGNOSIS — Z713 Dietary counseling and surveillance: Secondary | ICD-10-CM

## 2015-06-13 DIAGNOSIS — Z6841 Body Mass Index (BMI) 40.0 and over, adult: Secondary | ICD-10-CM

## 2015-06-13 NOTE — Patient Instructions (Signed)
Your cholesterol is too high. Activity and weight loss and quitting smoking can help lower your cholesterol.   Quitting smoking is a great goal.     Please read about Problem solving , the handout given yo you today, to see if you are ready to quit.   ACTIVITY- try taking a walk around the block  Try to remember how many days of the week you walk. Your goal  is to do this 3 to 5 days out of the week.   See you in two weeks.

## 2015-06-13 NOTE — Progress Notes (Signed)
  Medical Nutrition Therapy:  Appt start time: [id number] end time:  1435. Visit # 1  Assessment:  Primary concerns today: weight management.  Patient is referred for weight counseling but says her priority is to be more active and quit smoking. However, the last time she tried quitting she began to eat more and didn't;t like the weight gain so started back smoking. She has tried the patches and chewing tobacco in the past to help her quit with little success. We discussed possible referral to our pharmacists for tobacco cessation counseling. Last attempt at weight change was last year, tried walking at park with sons for 3 days of the week. Her knees started being painful on hills and she stopped. She reports having made lifestyle changes in the past successfully. She would like to eat more vegetables and fruit and whole grains eventually as well. Her hesitation in quitting smoking is that she does this with her husband. She also reports wanting to be active with others but not being abel to find an exercise partner.   Preferred Learning Style: Auditory,  Visual, Hands on Learning Readiness: contemplating/Ready VISION: okay with reading glasses ANTHROPOMETRICS: weight- 264.5#, heigh-65", BMI- 44- class III obesity WEIGHT HISTORY:lowest and highest adult weight 243# -267# SLEEP:"not well" bedtime- 2 am,  wake time- 930am-1030am, hard time falling asleep, gets up to urinate several times,  tv in bedroom and is on, reads bible, caffeine in evening- thru the night, drinks 8 oz water before bed MEDICATIONS: noted  PHYSICAL ACTIVITY: mostly reads and does sedentary activities, used to walk and liked it, has been thinking about doing it again LABS: increased cholesterol & LDL noted and discussed with patient  DIETARY INTAKE: Usual eating pattern includes 2-3  meals and 2-3 snacks per day. Everyday foods include sweets, white bread because this is what her husband eats, she prefers whole wheat.  Avoided foods  include none mentioned today.   24-hr recall: not done as patient seems more interested in quitting smoking and activity  Estimated daily energy needs for weight loss: 2000 calories 100-110 g protein   Progress Towards Goal(s):  In progress.   Nutritional Diagnosis:  NB-1.1 Food and nutrition-related knowledge deficit As related to lack of prior training on weight loss, lifestyle  and behavior change.  As evidenced by her report and questions.    Intervention:  Nutrition education about lifestyle change, tips for decreasing her cholesterol, outlined steps for making lifestyle changes in general. Coordination of care: consider referral to pharmacy for smoking cessation  Teaching Method Utilized: Visual, Auditory Handouts given during visit include: AVS, [employer] information, Thinking about changing a habot Barriers to learning/adherence to lifestyle change: competing values and lack of support Demonstrated degree of understanding via:  Teach Back   Monitoring/Evaluation:  Dietary intake, exercise, and body weight in 2 week(s).

## 2015-06-14 ENCOUNTER — Ambulatory Visit: Payer: Medicaid Other | Admitting: Family Medicine

## 2015-06-19 NOTE — Addendum Note (Signed)
Addended by: Joni Reining C on: 06/19/2015 01:58 PM   Modules accepted: Level of Service

## 2015-06-20 ENCOUNTER — Other Ambulatory Visit: Payer: Self-pay | Admitting: Internal Medicine

## 2015-06-20 NOTE — Telephone Encounter (Signed)
Pt requesting pantoprazole, furosemide, amlodipine and metoprolol to be filled @ [facility] on [address].

## 2015-06-21 MED ORDER — FUROSEMIDE 40 MG PO TABS: 40.0000 mg | ORAL_TABLET | Freq: Every day | ORAL | Status: DC

## 2015-06-21 NOTE — Telephone Encounter (Signed)
Peggy House, can we assign to a PCP?

## 2015-06-21 NOTE — Telephone Encounter (Signed)
It appears there are plenty of refills for the other medications, I will refill 1 month of Lasix until she is assigned a PCP

## 2015-06-27 ENCOUNTER — Ambulatory Visit: Payer: Medicaid Other | Admitting: Dietician

## 2015-06-27 ENCOUNTER — Telehealth: Payer: Self-pay | Admitting: Internal Medicine

## 2015-06-27 MED ORDER — ATORVASTATIN CALCIUM 40 MG PO TABS: 40.0000 mg | ORAL_TABLET | Freq: Every day | ORAL | Status: DC

## 2015-06-27 NOTE — Telephone Encounter (Signed)
NEEDS TO REFILL ON BLOOD PRESSURE MEDICATION, [facility] [address]

## 2015-06-27 NOTE — Telephone Encounter (Signed)
Patient should have refills, however pharmacy is telling her they do not.  Will call [facility].   Also, crestor not covered, is change appropriate?

## 2015-06-27 NOTE — Telephone Encounter (Signed)
May change to Atorvastatin 40mg  daily.

## 2015-06-28 NOTE — Telephone Encounter (Signed)
Called pt to inform Crestor changed.

## 2015-08-31 ENCOUNTER — Other Ambulatory Visit: Payer: Self-pay

## 2015-08-31 NOTE — Telephone Encounter (Signed)
Pt requesting xanax to be filled @ [facility] on [address].

## 2015-09-01 ENCOUNTER — Telehealth: Payer: Self-pay

## 2015-09-01 MED ORDER — ALPRAZOLAM 0.5 MG PO TABS: 0.5000 mg | ORAL_TABLET | Freq: Two times a day (BID) | ORAL | Status: DC | PRN

## 2015-09-01 NOTE — Telephone Encounter (Signed)
Please call in 1 month refill, hopefully she can be seen in clinic for follow up in the next month.

## 2015-09-01 NOTE — Telephone Encounter (Signed)
Called to pharm 

## 2015-09-01 NOTE — Telephone Encounter (Signed)
PT NEEDS APPT ASAP

## 2015-09-01 NOTE — Telephone Encounter (Signed)
Pt requesting the nurse to call back regarding face swollen.

## 2015-09-01 NOTE — Telephone Encounter (Signed)
Spoke w/ pt, facial swelling since mon, advised urg care or ED w/ f/u to clinic, refused, appt made for mon 5/22 dr Ronnald Ramp, cautioned that if she has shortness of breath, chest pain, h/a, n&v to call 911 or go to ED

## 2015-09-04 ENCOUNTER — Encounter: Payer: Self-pay | Admitting: Internal Medicine

## 2015-09-04 ENCOUNTER — Ambulatory Visit: Payer: Medicaid Other | Admitting: Internal Medicine

## 2015-09-28 ENCOUNTER — Other Ambulatory Visit: Payer: Self-pay

## 2015-09-28 MED ORDER — ALPRAZOLAM 0.5 MG PO TABS: 0.5000 mg | ORAL_TABLET | Freq: Two times a day (BID) | ORAL | Status: DC | PRN

## 2015-09-28 MED ORDER — FUROSEMIDE 40 MG PO TABS: 40.0000 mg | ORAL_TABLET | Freq: Every day | ORAL | Status: DC

## 2015-09-28 NOTE — Telephone Encounter (Signed)
I am going to approve one refill of alprazolam. She should make an appointment with Dr. Heber Rutland for the next refill within a month.

## 2015-09-28 NOTE — Telephone Encounter (Signed)
No fu appt scheduled

## 2015-09-28 NOTE — Telephone Encounter (Signed)
Pt requesting furosemide and alprazolam to be filled @ [facility] on [address].

## 2015-09-28 NOTE — Telephone Encounter (Signed)
Xanax rx called to [facility]. Also message sent to front office to schedule pt an appt.

## 2015-10-02 ENCOUNTER — Other Ambulatory Visit: Payer: Self-pay

## 2015-10-02 NOTE — Telephone Encounter (Signed)
Pt requesting citalopram to be filled @ [facility] on [address].

## 2015-10-03 MED ORDER — CITALOPRAM HYDROBROMIDE 20 MG PO TABS: 20.0000 mg | ORAL_TABLET | Freq: Every day | ORAL | Status: DC

## 2015-10-05 ENCOUNTER — Ambulatory Visit (INDEPENDENT_AMBULATORY_CARE_PROVIDER_SITE_OTHER): Payer: Medicaid Other | Admitting: Internal Medicine

## 2015-10-05 ENCOUNTER — Encounter: Payer: Self-pay | Admitting: Internal Medicine

## 2015-10-05 VITALS — BP 120/60 | HR 61 | Temp 98.0°F | Ht 66.0 in | Wt 261.6 lb

## 2015-10-05 DIAGNOSIS — M479 Spondylosis, unspecified: Secondary | ICD-10-CM

## 2015-10-05 DIAGNOSIS — M1712 Unilateral primary osteoarthritis, left knee: Secondary | ICD-10-CM | POA: Diagnosis not present

## 2015-10-05 DIAGNOSIS — E785 Hyperlipidemia, unspecified: Secondary | ICD-10-CM | POA: Diagnosis not present

## 2015-10-05 DIAGNOSIS — M159 Polyosteoarthritis, unspecified: Secondary | ICD-10-CM

## 2015-10-05 DIAGNOSIS — M8949 Other hypertrophic osteoarthropathy, multiple sites: Secondary | ICD-10-CM

## 2015-10-05 DIAGNOSIS — M150 Primary generalized (osteo)arthritis: Secondary | ICD-10-CM

## 2015-10-05 DIAGNOSIS — I10 Essential (primary) hypertension: Secondary | ICD-10-CM

## 2015-10-05 NOTE — Progress Notes (Signed)
[facility] Subjective:   Patient ID: Peggy House female   DOB: October 09, 1960 55 y.o.   MRN: [id number]  HPI: [name] is a 55 y.o. female with a PMH detailed below who presents for knee pain  She has a history of OA of her knees and back, she reports that she previously had good results of steroid injections to her knees and back but has not received a steroid injection to her knee since September 2016.  Her left knee is in about 8/10 pain and she feels she would benefit from one today.  She reports about 30 mins of stiffness in the morning, pain is worse with walking. She has no fevers or chills.  HLD: She has been taking her atorvastatin  HTN: Taking her medications, no issues    Past Medical History  Diagnosis Date  . Hypertension   . Anxiety   . History of swelling of feet   . Snores   . Arthritis     knnes,back  . GERD (gastroesophageal reflux disease)   . Hyperlipidemia   . Major depressive disorder, recurrent episode ([place]) 06/05/2015   Current Outpatient Prescriptions  Medication Sig Dispense Refill  . ALPRAZolam (XANAX) 0.5 MG tablet Take 1 tablet (0.5 mg total) by mouth 2 (two) times daily as needed for anxiety. 60 tablet 0  . amLODipine (NORVASC) 10 MG tablet Take 1 tablet (10 mg total) by mouth daily. 30 tablet 11  . atorvastatin (LIPITOR) 40 MG tablet Take 1 tablet (40 mg total) by mouth daily. 30 tablet 11  . citalopram (CELEXA) 20 MG tablet Take 1 tablet (20 mg total) by mouth daily. 30 tablet 1  . furosemide (LASIX) 40 MG tablet Take 1 tablet (40 mg total) by mouth daily. 30 tablet 1  . Iron-FA-B Cmp-C-Biot-Probiotic (FUSION PLUS) CAPS Take 1 capsule by mouth daily. 30 capsule 0  . metoprolol succinate (TOPROL-XL) 100 MG 24 hr tablet Take 1 tablet (100 mg total) by mouth daily. Take with or immediately following a meal. 30 tablet 11  . pantoprazole (PROTONIX) 40 MG tablet Take 1 tablet (40 mg total) by mouth daily. 30 tablet 5  .  [DISCONTINUED] pravastatin (PRAVACHOL) 40 MG tablet Take 40 mg by mouth daily.     No current facility-administered medications for this visit.   Family History  Problem Relation Age of Onset  . Cancer Mother   . Ovarian cancer Mother   . Diabetes type II Sister   . Dysmenorrhea Neg Hx   . Diabetes Sister   . Colon cancer Maternal Grandfather   . Stomach cancer Maternal Grandmother    Social History   Social History  . Marital Status: Single    Spouse Name: N/A  . Number of Children: N/A  . Years of Education: N/A   Social History Main Topics  . Smoking status: Current Every Day Smoker -- 0.50 packs/day for 30 years    Types: Cigarettes  . Smokeless tobacco: Never Used     Comment: 1/2 PPD  . Alcohol Use: No  . Drug Use: No     Comment: last cocaine-2010  . Sexual Activity: Not Asked   Other Topics Concern  . None   Social History Narrative   Review of Systems: Per hpi  Objective:  Physical Exam: Filed Vitals:   10/05/15 1613  BP: 120/60  Pulse: 61  Temp: 98 F (36.7 C)  TempSrc: Oral  Height: 5\' 6"  (1.676 m)  Weight: 261 lb  9.6 oz (118.661 kg)  SpO2: 100%   Physical Exam  Constitutional: She is well-developed, well-nourished, and in no distress.  HENT:  Head: Normocephalic and atraumatic.  Cardiovascular: Normal rate and regular rhythm.   Pulmonary/Chest: Effort normal and breath sounds normal.  Abdominal: Soft. Bowel sounds are normal.  Musculoskeletal: She exhibits no edema.       Left knee: She exhibits swelling. She exhibits no effusion. Tenderness found. Medial joint line and lateral joint line tenderness noted.    Assessment & Plan:  Case discussed with Dr. Daryll Drown  Essential hypertension Well controlled Continue current medications  HLD (hyperlipidemia) -Recheck Lipid panel goal LDL <100   Osteoarthritis, multiple sites A; OA of left knee, back  P; Steroid injection of left knee today.    Medications Ordered No orders of the  defined types were placed in this encounter.   Other Orders Orders Placed This Encounter  Procedures  . Lipid Profile  . CBC with Diff   Follow Up: Return in about 3 months (around 01/05/2016).

## 2015-10-05 NOTE — Progress Notes (Signed)
PROCEDURE NOTE  PROCEDURE: left knee joint steroid injection.  PREOPERATIVE DIAGNOSIS: Osteoarthritis of the left knee.  POSTOPERATIVE DIAGNOSIS: Osteoarthritis of the left knee.  PROCEDURE: The patient was apprised of the risks and the benefits of the procedure and informed consent was obtained, as witnessed by [employer]. Time-out procedure was performed, with confirmation of the patient's name, date of birth, and correct identification of the left knee to be injected. The patient's knee was then marked at the appropriate site for injection placement. The knee was sterilely prepped with Betadine. A 40 mg (1 milliliter) solution of Kenalog was drawn up into a 3 mL syringe with a 2 mL of 1% lidocaine. The patient was injected with a 27-gauge needle at the superior lateral aspect of her left flexed knee. There were no complications. The patient tolerated the procedure well. There was minimal bleeding. The patient was instructed to ice her knee upon leaving clinic and refrain from overuse over the next 3 days. The patient was instructed to go to the emergency room with any usual pain, swelling, or redness occurred in the injected area. The patient was given a followup appointment to evaluate response to the injection to his increased range of motion and reduction of pain.  The procedure was supervised by attending physician, Dr. Daryll Drown.

## 2015-10-05 NOTE — Assessment & Plan Note (Addendum)
-  Recheck Lipid panel goal LDL <100  ADDENDUM: LDL is 125, will increase to Atorvastatin 80mg , I notified patient and send Rx to pharmacy.

## 2015-10-05 NOTE — Assessment & Plan Note (Signed)
A; OA of left knee, back  P; Steroid injection of left knee today.

## 2015-10-05 NOTE — Assessment & Plan Note (Signed)
Well controlled. Continue current medications  

## 2015-10-05 NOTE — Patient Instructions (Signed)
General Instructions:   Please bring your medicines with you each time you come to clinic.  Medicines may include prescription medications, over-the-counter medications, herbal remedies, eye drops, vitamins, or other pills.   Progress Toward Treatment Goals:  No flowsheet data found.  Self Care Goals & Plans:  Self Care Goal 10/05/2015  Manage my medications take my medicines as prescribed; bring my medications to every visit  Eat healthy foods drink diet soda or water instead of juice or soda; eat more vegetables; eat foods that are low in salt; eat baked foods instead of fried foods; eat fruit for snacks and desserts  Be physically active find an activity I enjoy  Stop smoking cut down the number of cigarettes smoked  Meeting treatment goals maintain the current self-care plan    No flowsheet data found.   Care Management & Community Referrals:  No flowsheet data found.     Knee Injection A knee injection is a procedure to get medicine into your knee joint. Your health care provider puts a needle into the joint and injects medicine with an attached syringe. The injected medicine may relieve the pain, swelling, and stiffness of arthritis. The injected medicine may also help to lubricate and cushion your knee joint. You may need more than one injection. LET [facility] CARE PROVIDER KNOW ABOUT:  Any allergies you have.  All medicines you are taking, including vitamins, herbs, eye drops, creams, and over-the-counter medicines.  Previous problems you or members of your family have had with the use of anesthetics.  Any blood disorders you have.  Previous surgeries you have had.  Any medical conditions you may have. RISKS AND COMPLICATIONS Generally, this is a safe procedure. However, problems may occur, including:  Infection.  Bleeding.  Worsening symptoms.  Damage to the area around your knee.  Allergic reaction to any of the medicines.  Skin reactions from  repeated injections. BEFORE THE PROCEDURE  Ask your health care provider about changing or stopping your regular medicines. This is especially important if you are taking diabetes medicines or blood thinners.  Plan to have someone take you home after the procedure. PROCEDURE  You will sit or lie down in a position for your knee to be treated.  The skin over your kneecap will be cleaned with a germ-killing solution (antiseptic).  You will be given a medicine that numbs the area (local anesthetic). You may feel some stinging.  After your knee becomes numb, you will have a second injection. This is the medicine. This needle is carefully placed between your kneecap and your knee. The medicine is injected into the joint space.  At the end of the procedure, the needle will be removed.  A bandage (dressing) may be placed over the injection site. The procedure may vary among health care providers and hospitals. AFTER THE PROCEDURE  You may have to move your knee through its full range of motion. This helps to get all of the medicine into your joint space.  Your blood pressure, heart rate, breathing rate, and blood oxygen level will be monitored often until the medicines you were given have worn off.  You will be watched to make sure that you do not have a reaction to the injected medicine.   This information is not intended to replace advice given to you by your health care provider. Make sure you discuss any questions you have with your health care provider.   Document Released: 06/23/2006 Document Revised: 04/22/2014 Document Reviewed: 02/09/2014 Elsevier  Interactive Patient Education 2016 [employer]vier Inc.  

## 2015-10-06 LAB — CBC WITH DIFFERENTIAL/PLATELET
Basophils Absolute: 0 10*3/uL (ref 0.0–0.2)
Basos: 0 %
EOS (ABSOLUTE): 0.1 10*3/uL (ref 0.0–0.4)
EOS: 1 %
HEMATOCRIT: 35.5 % (ref 34.0–46.6)
HEMOGLOBIN: 10.9 g/dL — AB (ref 11.1–15.9)
Immature Grans (Abs): 0 10*3/uL (ref 0.0–0.1)
Immature Granulocytes: 0 %
LYMPHS ABS: 3.9 10*3/uL — AB (ref 0.7–3.1)
LYMPHS: 32 %
MCH: 23.2 pg — ABNORMAL LOW (ref 26.6–33.0)
MCHC: 30.7 g/dL — AB (ref 31.5–35.7)
MCV: 76 fL — ABNORMAL LOW (ref 79–97)
MONOCYTES: 4 %
Monocytes Absolute: 0.5 10*3/uL (ref 0.1–0.9)
Neutrophils Absolute: 7.6 10*3/uL — ABNORMAL HIGH (ref 1.4–7.0)
Neutrophils: 63 %
Platelets: 337 10*3/uL (ref 150–379)
RBC: 4.7 x10E6/uL (ref 3.77–5.28)
RDW: 19.8 % — ABNORMAL HIGH (ref 12.3–15.4)
WBC: 12.1 10*3/uL — AB (ref 3.4–10.8)

## 2015-10-06 LAB — LIPID PANEL
Chol/HDL Ratio: 4.4 ratio units (ref 0.0–4.4)
Cholesterol, Total: 188 mg/dL (ref 100–199)
HDL: 43 mg/dL (ref >39)
LDL Calculated: 125 mg/dL — ABNORMAL HIGH (ref 0–99)
TRIGLYCERIDES: 101 mg/dL (ref 0–149)
VLDL Cholesterol Cal: 20 mg/dL (ref 5–40)

## 2015-10-10 MED ORDER — ATORVASTATIN CALCIUM 80 MG PO TABS: 80.0000 mg | ORAL_TABLET | Freq: Every day | ORAL | Status: DC

## 2015-10-10 NOTE — Progress Notes (Signed)
Internal Medicine Clinic Attending  I saw and evaluated the patient.  I personally confirmed the key portions of the history and exam documented by Dr. Heber [place] and I reviewed pertinent patient test results.  The assessment, diagnosis, and plan were formulated together and I agree with the documentation in the resident's note.  I was present for entire procedure.

## 2015-10-10 NOTE — Addendum Note (Signed)
Addended by: Joni Reining C on: 10/10/2015 11:32 AM   Modules accepted: Orders

## 2015-11-02 ENCOUNTER — Other Ambulatory Visit: Payer: Self-pay

## 2015-11-02 NOTE — Telephone Encounter (Signed)
Requesting xanax to be filled @ [facility] on [address].

## 2015-11-03 MED ORDER — ALPRAZOLAM 0.5 MG PO TABS: 0.5000 mg | ORAL_TABLET | Freq: Two times a day (BID) | ORAL | Status: DC | PRN

## 2015-11-03 NOTE — Telephone Encounter (Signed)
Called to pharmacy  LVM for patient to return call

## 2015-11-03 NOTE — Telephone Encounter (Signed)
Scheduled appointment for next available with Dr. Danford Bad. 12/12/2015

## 2015-11-03 NOTE — Telephone Encounter (Signed)
Uses alprazolam bid for anxiety. Last office visit to address anxiety was 06/05/15. She called for refill on 6/15, I provided a one month supply and asked her to follow up in clinic for further refills. It looks like she was seen on 6/22 by Dr. Heber [place], but they did not address her anxiety or do refills in that visit (?). I am going to do the same thing I did in June, I will approve one month supply, please ask her to make a follow up with Dr. Danford Bad for an anxiety visit. Needs assessment, and should get these refills within a visit from now on please.

## 2015-11-09 NOTE — Telephone Encounter (Signed)
Called to pharm 

## 2015-11-13 ENCOUNTER — Emergency Department (HOSPITAL_COMMUNITY): Payer: Medicaid Other

## 2015-11-13 ENCOUNTER — Emergency Department (HOSPITAL_COMMUNITY)
Admission: EM | Admit: 2015-11-13 | Discharge: 2015-11-13 | Disposition: A | Discharge status: Home / Self Care | Payer: Medicaid Other | Attending: Emergency Medicine | Admitting: Emergency Medicine

## 2015-11-13 ENCOUNTER — Encounter (HOSPITAL_COMMUNITY): Payer: Self-pay | Admitting: Emergency Medicine

## 2015-11-13 DIAGNOSIS — Y9222 Religious institution as the place of occurrence of the external cause: Secondary | ICD-10-CM | POA: Diagnosis not present

## 2015-11-13 DIAGNOSIS — F17210 Nicotine dependence, cigarettes, uncomplicated: Secondary | ICD-10-CM | POA: Insufficient documentation

## 2015-11-13 DIAGNOSIS — X500XXA Overexertion from strenuous movement or load, initial encounter: Secondary | ICD-10-CM | POA: Insufficient documentation

## 2015-11-13 DIAGNOSIS — Y999 Unspecified external cause status: Secondary | ICD-10-CM | POA: Diagnosis not present

## 2015-11-13 DIAGNOSIS — Y9341 Activity, dancing: Secondary | ICD-10-CM | POA: Insufficient documentation

## 2015-11-13 DIAGNOSIS — M5431 Sciatica, right side: Secondary | ICD-10-CM

## 2015-11-13 DIAGNOSIS — M545 Low back pain: Secondary | ICD-10-CM | POA: Diagnosis present

## 2015-11-13 DIAGNOSIS — M5441 Lumbago with sciatica, right side: Secondary | ICD-10-CM | POA: Diagnosis not present

## 2015-11-13 DIAGNOSIS — I10 Essential (primary) hypertension: Secondary | ICD-10-CM | POA: Insufficient documentation

## 2015-11-13 HISTORY — DX: Obesity, unspecified: E66.9

## 2015-11-13 MED ORDER — PREDNISONE 20 MG PO TABS: 40.0000 mg | ORAL_TABLET | Freq: Every day | ORAL | 0 refills | Status: DC

## 2015-11-13 MED ORDER — DEXAMETHASONE SODIUM PHOSPHATE 10 MG/ML IJ SOLN
10.0000 mg | Freq: Once | INTRAMUSCULAR | Status: AC
  Administered 2015-11-13: 10 mg via INTRAMUSCULAR
  Filled 2015-11-13: qty 1

## 2015-11-13 MED ORDER — CYCLOBENZAPRINE HCL 10 MG PO TABS
10.0000 mg | ORAL_TABLET | Freq: Once | ORAL | Status: AC
  Administered 2015-11-13: 10 mg via ORAL
  Filled 2015-11-13: qty 1

## 2015-11-13 MED ORDER — OXYCODONE-ACETAMINOPHEN 5-325 MG PO TABS
1.0000 | ORAL_TABLET | Freq: Once | ORAL | Status: AC
  Administered 2015-11-13: 1 via ORAL
  Filled 2015-11-13: qty 1

## 2015-11-13 MED ORDER — HYDROCODONE-ACETAMINOPHEN 5-325 MG PO TABS: 2.0000 | ORAL_TABLET | ORAL | 0 refills | Status: DC | PRN

## 2015-11-13 MED ORDER — KETOROLAC TROMETHAMINE 60 MG/2ML IM SOLN
60.0000 mg | Freq: Once | INTRAMUSCULAR | Status: AC
  Administered 2015-11-13: 60 mg via INTRAMUSCULAR
  Filled 2015-11-13: qty 2

## 2015-11-13 NOTE — ED Triage Notes (Signed)
Pt. reports right hip pain for >1 week , denies injury , no urinary discomfort , pain increases with movement .

## 2015-11-13 NOTE — ED Provider Notes (Signed)
[place] DEPT Provider Note   CSN: [id number] Arrival date & time: 11/13/15  1847  First Provider Contact:  None    By signing my name below, I, Peggy House, attest that this documentation has been prepared under the direction and in the presence of Delsa Grana, PA-C. Electronically Signed: Dora House, Scribe. 11/13/2015. 9:33 PM.   History   Chief Complaint Chief Complaint  Patient presents with  . Hip Pain    The history is provided by the patient. No language interpreter was used.     HPI Comments: Peggy House is a 55 y.o. female with PMHx who presents to the Emergency Department complaining of gradual onset, constant, worsening, sharp, 10/10, posterior right hip pain for the last 8 days. Pt reports that she was dancing at church 8 days ago and her symptoms presented shortly thereafter, but she does not know if this is the cause of her symptoms. She states the pain originates in her lower back, but denies pain radiation down her right leg. She endorses severe pain exacerbation with palpation to her lower back and her posterior right hip. Pt states she is able to ambulate with a limp. She denies saddle anesthesia, bowel/bladder incontinence, or any other associated symptoms.  Past Medical History:  Diagnosis Date  . Anxiety   . Arthritis    knnes,back  . GERD (gastroesophageal reflux disease)   . History of swelling of feet   . Hyperlipidemia   . Hypertension   . Major depressive disorder, recurrent episode ([place]) 06/05/2015  . Obesity   . Snores     Patient Active Problem List   Diagnosis Date Noted  . Osteoarthritis, multiple sites 06/05/2015  . Insomnia 06/05/2015  . Major depressive disorder, recurrent episode ([place]) 06/05/2015  . Morbid obesity ([place]) 04/26/2014  . Gastric AVM 04/26/2014  . GIB (gastrointestinal bleeding) 04/25/2014  . GI bleed 04/25/2014  . HLD (hyperlipidemia) 04/25/2014  . Leg swelling 04/25/2014  . Anxiety   . Arthritis   .  Essential hypertension   . Demand ischemia ([place]) 04/08/2014  . Acute blood loss anemia 04/07/2014    Past Surgical History:  Procedure Laterality Date  . ABDOMINAL HYSTERECTOMY    . CARPAL TUNNEL RELEASE  05/13/2011   Procedure: CARPAL TUNNEL RELEASE;  Surgeon: Nita Sells, MD;  Location: [place];  Service: Orthopedics;  Laterality: Left;  . COLONOSCOPY WITH PROPOFOL N/A 04/28/2014   Procedure: COLONOSCOPY WITH PROPOFOL;  Surgeon: Cleotis Nipper, MD;  Location: [facility] ENDOSCOPY;  Service: Endoscopy;  Laterality: N/A;  . DG TOES*L*  2/10   rt  . DILATION AND CURETTAGE OF UTERUS    . ESOPHAGOGASTRODUODENOSCOPY N/A 04/10/2014   Procedure: ESOPHAGOGASTRODUODENOSCOPY (EGD);  Surgeon: [name], MD;  Location[facility] ENDOSCOPY;  Service: Endoscopy;  Laterality: N/A;  . ESOPHAGOGASTRODUODENOSCOPY (EGD) WITH PROPOFOL N/A 04/27/2014   Procedure: ESOPHAGOGASTRODUODENOSCOPY (EGD) WITH PROPOFOL;  Surgeon: Cleotis Nipper, MD;  Location: [place];  Service: Endoscopy;  Laterality: N/A;  possible apc  . HOT HEMOSTASIS N/A 04/27/2014   Procedure: HOT HEMOSTASIS (ARGON PLASMA COAGULATION/BICAP);  Surgeon: Cleotis Nipper, MD;  Location: [facility] ENDOSCOPY;  Service: Endoscopy;  Laterality: N/A;  . L shoulder Surgery  2011    OB History    No data available       Home Medications    Prior to Admission medications   Medication Sig Start Date End Date Taking? Authorizing Provider  ALPRAZolam Duanne Moron) 0.5 MG tablet Take 1 tablet (0.5 mg  total) by mouth 2 (two) times daily as needed for anxiety. 11/03/15   Axel Filler, MD  amLODipine (NORVASC) 10 MG tablet Take 1 tablet (10 mg total) by mouth daily. 06/05/15   Lucious Groves, DO  atorvastatin (LIPITOR) 80 MG tablet Take 1 tablet (80 mg total) by mouth daily. 10/10/15 10/09/16  Lucious Groves, DO  citalopram (CELEXA) 20 MG tablet Take 1 tablet (20 mg total) by mouth daily. 10/03/15   Sid Falcon, MD  furosemide  (LASIX) 40 MG tablet Take 1 tablet (40 mg total) by mouth daily. 09/28/15   Axel Filler, MD  HYDROcodone-acetaminophen (NORCO/VICODIN) 5-325 MG tablet Take 2 tablets by mouth every 4 (four) hours as needed. 11/13/15   Delsa Grana, PA-C  Iron-FA-B Cmp-C-Biot-Probiotic (FUSION PLUS) CAPS Take 1 capsule by mouth daily. 05/20/15   Melony Overly, MD  metoprolol succinate (TOPROL-XL) 100 MG 24 hr tablet Take 1 tablet (100 mg total) by mouth daily. Take with or immediately following a meal. 06/05/15 06/04/16  Lucious Groves, DO  pantoprazole (PROTONIX) 40 MG tablet Take 1 tablet (40 mg total) by mouth daily. 06/05/15   Lucious Groves, DO  predniSONE (DELTASONE) 20 MG tablet Take 2 tablets (40 mg total) by mouth daily. 11/13/15   Delsa Grana, PA-C    Family History Family History  Problem Relation Age of Onset  . Cancer Mother   . Ovarian cancer Mother   . Diabetes type II Sister   . Diabetes Sister   . Colon cancer Maternal Grandfather   . Stomach cancer Maternal Grandmother   . Dysmenorrhea Neg Hx     Social History Social History  Substance Use Topics  . Smoking status: Current Every Day Smoker    Years: 30.00    Types: Cigarettes  . Smokeless tobacco: Never Used     Comment: 1/2 PPD  . Alcohol use No    Allergies   Nsaids and Tomato   Review of Systems Review of Systems  All other systems reviewed and are negative.   Physical Exam Updated Vital Signs BP 150/66 (BP Location: Right Arm)   Pulse (!) 48   Temp 98.7 F (37.1 C) (Oral)   Resp 19   Ht 5\' 5"  (1.651 m)   Wt 120.2 kg   SpO2 100%   BMI 44.10 kg/m   Physical Exam  Constitutional: She is oriented to person, place, and time. She appears well-developed and well-nourished. No distress.  HENT:  Head: Normocephalic and atraumatic.  Eyes: Conjunctivae and EOM are normal.  Neck: Neck supple. No tracheal deviation present.  Cardiovascular: Normal rate.   Pulmonary/Chest: Effort normal. No respiratory distress.    Musculoskeletal: Normal range of motion.  Low right lumbar paraspinal tenderness. Left SI joint and buttocks tenderness. Antalgic gait.  Neurological: She is alert and oriented to person, place, and time.  Normal sensation and strength.   Skin: Skin is warm and dry.  Psychiatric: She has a normal mood and affect. Her behavior is normal.  Nursing note and vitals reviewed.   ED Treatments / Results  Labs (all labs ordered are listed, but only abnormal results are displayed) Labs Reviewed - No data to display  EKG  EKG Interpretation None       Radiology No results found.  Procedures Procedures (including critical care time)  DIAGNOSTIC STUDIES: Oxygen Saturation is 99% on RA, normal by my interpretation.    COORDINATION OF CARE: 9:33 PM Discussed treatment plan with pt at  bedside and pt agreed to plan.   Medications Ordered in ED Medications  ketorolac (TORADOL) injection 60 mg (60 mg Intramuscular Given 11/13/15 2154)  dexamethasone (DECADRON) injection 10 mg (10 mg Intramuscular Given 11/13/15 2154)  oxyCODONE-acetaminophen (PERCOCET/ROXICET) 5-325 MG per tablet 1 tablet (1 tablet Oral Given 11/13/15 2153)  cyclobenzaprine (FLEXERIL) tablet 10 mg (10 mg Oral Given 11/13/15 2153)     Initial Impression / Assessment and Plan / ED Course  I have reviewed the triage vital signs and the nursing notes.  Pertinent labs & imaging results that were available during my care of the patient were reviewed by me and considered in my medical decision making (see chart for details).  Clinical Course    Normal neurological exam, no evidence of urinary incontinence or retention, pain is consistently reproducible.  Patient can walk but states is painful.  No loss of bowel or bladder control.  No concern for cauda equina.  No fever, night sweats, weight loss, h/o cancer, IVDU.  Pain treated here in the department with adequate improvement. RICE protocol and pain medicine indicated and  discussed with patient. I have also discussed reasons to return immediately to the ER.  Patient expresses understanding and agrees with plan.   I personally performed the services described in this documentation, which was scribed in my presence. The recorded information has been reviewed and is accurate.     Final Clinical Impressions(s) / ED Diagnoses   Final diagnoses:  Sciatica of right side    New Prescriptions Discharge Medication List as of 11/13/2015  9:53 PM    START taking these medications   Details  HYDROcodone-acetaminophen (NORCO/VICODIN) 5-325 MG tablet Take 2 tablets by mouth every 4 (four) hours as needed., Starting Mon 11/13/2015, Print    predniSONE (DELTASONE) 20 MG tablet Take 2 tablets (40 mg total) by mouth daily., Starting Mon 11/13/2015, Print         Delsa Grana, PA-C 11/16/15 0745    Virgel Manifold, MD 11/16/15 2217

## 2015-12-12 ENCOUNTER — Encounter: Payer: Self-pay | Admitting: Internal Medicine

## 2015-12-12 ENCOUNTER — Encounter: Payer: Medicaid Other | Admitting: Internal Medicine

## 2015-12-20 ENCOUNTER — Ambulatory Visit (INDEPENDENT_AMBULATORY_CARE_PROVIDER_SITE_OTHER): Payer: Medicaid Other | Admitting: Internal Medicine

## 2015-12-20 VITALS — BP 139/75 | HR 55 | Temp 98.0°F | Ht 65.0 in | Wt 267.2 lb

## 2015-12-20 DIAGNOSIS — D500 Iron deficiency anemia secondary to blood loss (chronic): Secondary | ICD-10-CM

## 2015-12-20 DIAGNOSIS — F419 Anxiety disorder, unspecified: Secondary | ICD-10-CM | POA: Diagnosis not present

## 2015-12-20 DIAGNOSIS — Z8719 Personal history of other diseases of the digestive system: Secondary | ICD-10-CM | POA: Diagnosis not present

## 2015-12-20 DIAGNOSIS — I10 Essential (primary) hypertension: Secondary | ICD-10-CM

## 2015-12-20 DIAGNOSIS — D62 Acute posthemorrhagic anemia: Secondary | ICD-10-CM

## 2015-12-20 DIAGNOSIS — G8929 Other chronic pain: Secondary | ICD-10-CM | POA: Diagnosis not present

## 2015-12-20 DIAGNOSIS — D72829 Elevated white blood cell count, unspecified: Secondary | ICD-10-CM

## 2015-12-20 DIAGNOSIS — F17210 Nicotine dependence, cigarettes, uncomplicated: Secondary | ICD-10-CM | POA: Diagnosis not present

## 2015-12-20 DIAGNOSIS — M7989 Other specified soft tissue disorders: Secondary | ICD-10-CM

## 2015-12-20 DIAGNOSIS — E669 Obesity, unspecified: Secondary | ICD-10-CM

## 2015-12-20 DIAGNOSIS — Z6841 Body Mass Index (BMI) 40.0 and over, adult: Secondary | ICD-10-CM | POA: Diagnosis not present

## 2015-12-20 DIAGNOSIS — G4700 Insomnia, unspecified: Secondary | ICD-10-CM | POA: Diagnosis not present

## 2015-12-20 DIAGNOSIS — M549 Dorsalgia, unspecified: Secondary | ICD-10-CM

## 2015-12-20 MED ORDER — LISINOPRIL 10 MG PO TABS: 10.0000 mg | ORAL_TABLET | Freq: Every day | ORAL | 11 refills | Status: DC

## 2015-12-20 MED ORDER — FUSION PLUS PO CAPS: 1.0000 | ORAL_CAPSULE | Freq: Every day | ORAL | 0 refills | Status: DC

## 2015-12-20 MED ORDER — METOPROLOL SUCCINATE ER 100 MG PO TB24: 100.0000 mg | ORAL_TABLET | Freq: Every day | ORAL | 11 refills | Status: DC

## 2015-12-20 MED ORDER — ALPRAZOLAM 0.5 MG PO TABS: 0.5000 mg | ORAL_TABLET | Freq: Two times a day (BID) | ORAL | 0 refills | Status: DC | PRN

## 2015-12-20 MED ORDER — ATORVASTATIN CALCIUM 80 MG PO TABS: 80.0000 mg | ORAL_TABLET | Freq: Every day | ORAL | 11 refills | Status: DC

## 2015-12-20 MED ORDER — FUROSEMIDE 40 MG PO TABS: 40.0000 mg | ORAL_TABLET | Freq: Every day | ORAL | 1 refills | Status: DC

## 2015-12-20 MED ORDER — PANTOPRAZOLE SODIUM 40 MG PO TBEC: 40.0000 mg | DELAYED_RELEASE_TABLET | Freq: Every day | ORAL | 5 refills | Status: DC

## 2015-12-20 MED ORDER — CITALOPRAM HYDROBROMIDE 40 MG PO TABS: 40.0000 mg | ORAL_TABLET | Freq: Every day | ORAL | 3 refills | Status: DC

## 2015-12-20 NOTE — Assessment & Plan Note (Signed)
>>  ASSESSMENT AND PLAN FOR ANXIETY WRITTEN ON 12/20/2015  5:53 PM BY AMIN, SUMAYYA, MD  She states that her anxiety medicines are not working as they used to before. She takes Xanax  0.5 mg twice daily and Celexa  20 mg daily for that purpose. I told her to take Xanax  only as needed for bad anxiety. I increased her Celexa  to 40 mg daily.

## 2015-12-20 NOTE — Assessment & Plan Note (Signed)
She had an history of AVM and frequent nosebleeds. She was concerned that her symptoms might be due to anemia. She normally uses iron supplement but she states that she ran out of her iron for about a month now. Her last hemoglobin done in June 2017 was 10.9. Repeat CBC.

## 2015-12-20 NOTE — Assessment & Plan Note (Signed)
Her blood pressure today was 139/75. She was taking Norvasc and Toprol and Lasix. I changed her Norvasc with lisinopril 10 mg daily as Norvasc can cause lower extremity edema. She was instructed to report any worsening cough or swelling of her lips and face.

## 2015-12-20 NOTE — Assessment & Plan Note (Signed)
She states that her anxiety medicines are not working as they used to before. She takes Xanax 0.5 mg twice daily and Celexa 20 mg daily for that purpose. I told her to take Xanax only as needed for bad anxiety. I increased her Celexa to 40 mg daily.

## 2015-12-20 NOTE — Patient Instructions (Addendum)
It was pleasure taking care of you. As we have talked, I changed your Norvasc to Lisinopril due to your leg swelling, please let us know if you get any bother some cough with it. I have increased the dose of Celexa, your depression medicine.You can Xanax as needed, but stop taking it on a regular bases. We are sending you for sleep study to see if you are having any sleep apnea. We are checking your blood for Hgb and renal functions today. Please take your medicines as directed. Please F/U after your sleep study to discuss results and for further management.

## 2015-12-20 NOTE — Assessment & Plan Note (Signed)
She states that Lasix is helping with her leg swelling. It might be due to Norvasc or there might be some CHF component in it. I changed her Norvasc with lisinopril today. She will need reassessment if that swelling persist. She had some history of orthopnea and occasional PND. She denies any chest pain, exertional dyspnea or palpitations. She never had any echo done. We will consider doing an echo in the future if her swelling does not improve with change in medicine or she is still had some orthopnea and PND.

## 2015-12-20 NOTE — Assessment & Plan Note (Signed)
She presented to the clinic today with complaint of unable to get a good night's sleep. She states that she  normally sleeps around 1 AM, she goes to bed around midnight, she due watches some TV at night. She denies any caffeine use in the evening. She states that she woke up around 9-10 AM but never feels refreshed. She does complain of some daytime somnolence but normally do not take naps.  She had history of orthopnea and sleeps on 3 pillows which she described doing for a long time and it is partially because of her back ache and sometimes she had difficulty breathing while laying flat. She do give history of occasional PND. She does states that she snores a lot while sleeping. -We talked about good sleep hygiene today. -We gave her a referral for sleep study to rule out sleep apnea. If sleep study is negative for sleep apnea or her symptoms does not improve we will consider doing an echo to rule out CHF.

## 2015-12-20 NOTE — Progress Notes (Addendum)
   CC: Unable to get a good night sleep.  HPI:  Peggy House is a 55 y.o. with PMH as listed below, presented to the clinic today with complaint of unable to get a good night's sleep. She states that she  normally sleeps around 1 AM, she goes to bed around midnight, she due watches some TV at night. She denies any caffeine use in the evening. She states that she woke up around 9-10 AM but never feels refreshed. She does complain of some daytime somnolence but normally do not take naps. She complains of tiredness and she attributes that to her lack of good sleep, and things she might becoming anemic again because of her frequent nosebleeds. She had an history of AVM and her last hemoglobin in June 2017 was 10.9. She normally takes iron supplement, she was out of her supplements for about a month. She denies any recent GI bleed or hematuria. She gives history of orthopnea and sleeps on 3 pillows which she described doing for a long time and it is partially because of her back ache and sometimes she had difficulty breathing while laying flat. She do give history of occasional PND. She does states that she snores a lot while sleeping. She had an history of swelling of her legs and she takes Lasix 40 mg daily for that purpose. She denies any exertional dyspnea, chest pain, palpitations. She complain of worsening anxiety and states that her anxiety medicines are not working anymore. She takes Xanax 0.5 mg twice daily and Celexa 20 mg daily for that purpose. She is an obese lady, states that she is working on her dietary habits to lose some weight. She still smokes half a pack per day and not ready to quit.  Past Medical History:  Diagnosis Date  . Anxiety   . Arthritis    knnes,back  . GERD (gastroesophageal reflux disease)   . History of swelling of feet   . Hyperlipidemia   . Hypertension   . Major depressive disorder, recurrent episode ([place]) 06/05/2015  . Obesity   . Snores     Review of  Systems:  As per HPI.  Physical Exam:  Vitals:   12/20/15 1431  BP: 139/75  Pulse: (!) 55  Temp: 98 F (36.7 C)  TempSrc: Oral  SpO2: 100%  Weight: 267 lb 3.2 oz (121.2 kg)  Height: 5\' 5"  (1.651 m)   General. Well-built, well developed, obese lady not in acute distress. HEENT. There was an  erythema and few petechial hemorrhages noted at her nasal mucosa. There were few petechia petechial hemorrhages at her upper palate. No pharyngeal or nasal exudate. No JVD, No carotid bruit. Lungs. Clear bilaterally.  CV. Regular rate and rhythm, no murmur gallop or rub. Abdomen. Soft, nontender, obese, bowel sounds positive. Extremities. 1+ pedal edema bilaterally. No cyanosis Pulses. 2+ bilaterally.  Assessment & Plan:   She has leukocytosis with out any sign off infection. She does not has any history of dysuria or upper respiratory symptoms. She was given prednisolone recently during her recent ED visit due to exacerbation of her chronic back pain. Her current leukocytosis may be due to her recent CT diet use. We will repeat CBC during her next follow-up.  See Encounters Tab for problem based charting.  Patient seen with Dr. Eppie Gibson

## 2015-12-21 LAB — BMP8+ANION GAP
Anion Gap: 16 mmol/L (ref 10.0–18.0)
BUN/Creatinine Ratio: 10 (ref 9–23)
BUN: 9 mg/dL (ref 6–24)
CALCIUM: 9.5 mg/dL (ref 8.7–10.2)
CO2: 25 mmol/L (ref 18–29)
CREATININE: 0.87 mg/dL (ref 0.57–1.00)
Chloride: 103 mmol/L (ref 96–106)
GFR, EST AFRICAN AMERICAN: 87 mL/min/{1.73_m2} (ref >59)
GFR, EST NON AFRICAN AMERICAN: 76 mL/min/{1.73_m2} (ref >59)
Glucose: 92 mg/dL (ref 65–99)
POTASSIUM: 4.1 mmol/L (ref 3.5–5.2)
SODIUM: 144 mmol/L (ref 134–144)

## 2015-12-21 LAB — CBC
Hematocrit: 32.6 % — ABNORMAL LOW (ref 34.0–46.6)
Hemoglobin: 10.3 g/dL — ABNORMAL LOW (ref 11.1–15.9)
MCH: 24 pg — ABNORMAL LOW (ref 26.6–33.0)
MCHC: 31.6 g/dL (ref 31.5–35.7)
MCV: 76 fL — ABNORMAL LOW (ref 79–97)
PLATELETS: 351 10*3/uL (ref 150–379)
RBC: 4.29 x10E6/uL (ref 3.77–5.28)
RDW: 19.5 % — AB (ref 12.3–15.4)
WBC: 17.1 10*3/uL — AB (ref 3.4–10.8)

## 2015-12-21 NOTE — Progress Notes (Signed)
I saw and evaluated the patient.  I personally confirmed the key portions of Dr. Amin's history and exam and reviewed pertinent patient test results.  The assessment, diagnosis, and plan were formulated together and I agree with the documentation in the resident's note. 

## 2015-12-22 NOTE — Addendum Note (Signed)
Addended by: Colin Rhein on: 12/22/2015 06:39 PM   Modules accepted: Orders

## 2015-12-22 NOTE — Progress Notes (Signed)
She has leukocytosis with out any sign off infection. She does not has any history of dysuria or upper respiratory symptoms. She was given prednisolone recently during her recent ED visit due to exacerbation of her chronic back pain. Her current leukocytosis may be due to her recent CT diet use. We will repeat CBC during her next follow-up.

## 2015-12-25 NOTE — Addendum Note (Signed)
Addended by: Colin Rhein on: 12/25/2015 05:53 PM   Modules accepted: Orders

## 2016-02-01 ENCOUNTER — Other Ambulatory Visit: Payer: Self-pay | Admitting: Student in an Organized Health Care Education/Training Program

## 2016-02-02 NOTE — Telephone Encounter (Signed)
Rx phoned in.Janalyn Higby Cassady10/20/20179:43 AM

## 2016-02-05 ENCOUNTER — Ambulatory Visit (HOSPITAL_BASED_OUTPATIENT_CLINIC_OR_DEPARTMENT_OTHER): Payer: Medicaid Other | Attending: Internal Medicine | Admitting: Internal Medicine

## 2016-02-05 DIAGNOSIS — E669 Obesity, unspecified: Secondary | ICD-10-CM

## 2016-02-05 DIAGNOSIS — E6609 Other obesity due to excess calories: Secondary | ICD-10-CM

## 2016-02-05 DIAGNOSIS — R0683 Snoring: Secondary | ICD-10-CM

## 2016-02-05 DIAGNOSIS — G4700 Insomnia, unspecified: Secondary | ICD-10-CM

## 2016-02-05 DIAGNOSIS — E66812 Obesity, class 2: Secondary | ICD-10-CM

## 2016-02-11 DIAGNOSIS — E6609 Other obesity due to excess calories: Secondary | ICD-10-CM

## 2016-02-11 DIAGNOSIS — R0683 Snoring: Secondary | ICD-10-CM

## 2016-02-11 NOTE — Procedures (Signed)
  Patient Name: Peggy House, Peggy House Date: 02/05/2016 Gender: Female D.O.B: [birth year] Age (years): 55 Referring Provider: Bartholomew Crews Height (inches): 9 Interpreting Physician: Baird Lyons MD, ABSM Weight (lbs): 260 RPSGT: Madelon Lips BMI: 43 MRN: [id number] Neck Size: 16.00 CLINICAL INFORMATION Sleep Study Type: NPSG Indication for sleep study: Obesity Epworth Sleepiness Score: 10  SLEEP STUDY TECHNIQUE As per the AASM Manual for the Scoring of Sleep and Associated Events v2.3 (April 2016) with a hypopnea requiring 4% desaturations. The channels recorded and monitored were frontal, central and occipital EEG, electrooculogram (EOG), submentalis EMG (chin), nasal and oral airflow, thoracic and abdominal wall motion, anterior tibialis EMG, snore microphone, electrocardiogram, and pulse oximetry.  MEDICATIONS Medications self-administered by patient taken the night of the study : [place] The study was initiated at 10:14:08 PM and ended at 4:17:55 AM. Sleep onset time was 14.1 minutes and the sleep efficiency was 83.2%. The total sleep time was 302.7 minutes. Stage REM latency was 186.0 minutes. The patient spent 17.35% of the night in stage N1 sleep, 76.54% in stage N2 sleep, 0.00% in stage N3 and 6.11% in REM. Alpha intrusion was absent. Supine sleep was 16.41%.  RESPIRATORY PARAMETERS The overall apnea/hypopnea index (AHI) was 4.4 per hour. There were 0 total apneas, including 0 obstructive, 0 central and 0 mixed apneas. There were 22 hypopneas and 27 RERAs. The AHI during Stage REM sleep was 22.7 per hour. AHI while supine was 9.7 per hour. The mean oxygen saturation was 93.82%. The minimum SpO2 during sleep was 86.00%. Loud snoring was noted during this study.  CARDIAC DATA The 2 lead EKG demonstrated sinus rhythm. The mean heart rate was 55.25 beats per minute. Other EKG findings include: PVCs.  LEG MOVEMENT DATA The total PLMS were 0 with  a resulting PLMS index of 0.00. Associated arousal with leg movement index was 0.0 .  IMPRESSIONS - No significant obstructive sleep apnea occurred during this study (AHI = 4.4/h). - No significant central sleep apnea occurred during this study (CAI = 0.0/h). - Mild oxygen desaturation was noted during this study (Min O2 = 86.00%). - The patient snored with Loud snoring volume. - EKG findings include PVCs. - Clinically significant periodic limb movements did not occur during sleep. No significant associated arousals.  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 - Sleep hygiene should be reviewed to assess factors that may improve sleep quality. - Weight management and regular exercise should be initiated or continued if appropriate.  [Electronically signed] 02/11/2016 10:58 AM  Baird Lyons MD, ABSM Diplomate, American Board of Sleep Medicine   NPI: [id number]  [place], American Board of Sleep Medicine  ELECTRONICALLY SIGNED ON:  02/11/2016, 10:57 AM [place] PH: [phone]   FX: [phone] [place]

## 2016-03-06 NOTE — Progress Notes (Signed)
Sleep study results. Forward to PCP

## 2016-04-17 ENCOUNTER — Other Ambulatory Visit: Payer: Self-pay

## 2016-04-17 NOTE — Telephone Encounter (Signed)
ALPRAZolam (XANAX) 0.5 MG tablet, furosemide (LASIX) 40 MG tablet, refill request @ [facility] on [address].

## 2016-04-18 NOTE — Telephone Encounter (Signed)
Thank you for that information. She will require an appointment in the clinic prior to obtaining a refill of this medication. Could we try to get her into [facility] in order to touch base sometime this week? Thanks so much!

## 2016-04-19 MED ORDER — ALPRAZOLAM 0.5 MG PO TABS: 0.5000 mg | ORAL_TABLET | Freq: Two times a day (BID) | ORAL | 0 refills | Status: DC | PRN

## 2016-04-19 MED ORDER — FUROSEMIDE 40 MG PO TABS: 40.0000 mg | ORAL_TABLET | Freq: Every day | ORAL | 1 refills | Status: DC

## 2016-04-19 NOTE — Telephone Encounter (Signed)
Hi made appt for 1/10 at 1315, please let the [place] doc's know your thoughts on pt and alprazolam thanks

## 2016-04-19 NOTE — Telephone Encounter (Signed)
Called to pharm 

## 2016-04-23 ENCOUNTER — Telehealth: Payer: Self-pay | Admitting: Internal Medicine

## 2016-04-23 NOTE — Telephone Encounter (Signed)
APT. REMINDER CALL, NO ANSWER, NO VOICEMAIL °

## 2016-04-24 ENCOUNTER — Ambulatory Visit (INDEPENDENT_AMBULATORY_CARE_PROVIDER_SITE_OTHER): Payer: Medicaid Other | Admitting: Internal Medicine

## 2016-04-24 VITALS — BP 121/78 | HR 66 | Temp 98.6°F | Wt 267.0 lb

## 2016-04-24 DIAGNOSIS — R0601 Orthopnea: Secondary | ICD-10-CM

## 2016-04-24 DIAGNOSIS — F17210 Nicotine dependence, cigarettes, uncomplicated: Secondary | ICD-10-CM

## 2016-04-24 DIAGNOSIS — M150 Primary generalized (osteo)arthritis: Principal | ICD-10-CM

## 2016-04-24 DIAGNOSIS — Z8711 Personal history of peptic ulcer disease: Secondary | ICD-10-CM

## 2016-04-24 DIAGNOSIS — M159 Polyosteoarthritis, unspecified: Secondary | ICD-10-CM

## 2016-04-24 DIAGNOSIS — M1712 Unilateral primary osteoarthritis, left knee: Secondary | ICD-10-CM | POA: Diagnosis not present

## 2016-04-24 DIAGNOSIS — M7989 Other specified soft tissue disorders: Secondary | ICD-10-CM | POA: Diagnosis not present

## 2016-04-24 DIAGNOSIS — M8949 Other hypertrophic osteoarthropathy, multiple sites: Secondary | ICD-10-CM

## 2016-04-24 MED ORDER — FUROSEMIDE 40 MG PO TABS: 40.0000 mg | ORAL_TABLET | Freq: Every day | ORAL | 2 refills | Status: DC

## 2016-04-24 NOTE — Assessment & Plan Note (Signed)
Patient complains of history of orthopnea which she believes is worsening recently. She currently uses 3 pillows and has for some time. She notes significant paroxysmal nocturnal dyspnea, and wakes up frequently feeling short of breath and difficulty breathing during the night. She was evaluated with a polysomnographic which was ordered at her last visit which was negative for any obstructive sleep apnea and noted only primary snoring. Patient also complains of some primary insomnia which keeps her from falling asleep however was asleep her primary complaint is that of dyspnea.  These findings in conjunction with her history of lower show any swelling are concerning for signs of congestive heart failure. She has never had an echocardiogram evaluation and feel that she would likely benefit from this diagnostic test to rule out cardiogenic causes of her symptoms. An order has been placed for this.  Plan: Outpatient echocardiogram. Continue furosemide 40 mg daily when necessary for lower extremity swelling.

## 2016-04-24 NOTE — Assessment & Plan Note (Signed)
Patient presents with complaints of left knee pain consistent with previous osteoarthritic pain in the past. She has received multiple sets of steroid injections in other joints including her back and shoulders. She also received a steroid injection in the left knee proximally 6 months ago by Dr. Heber Galva in the clinic. She reports that this injection gave her significant relief for about 2-1/2 months. Since that time she's been taking Tylenol 4-5 g daily. She avoids NSAIDs due to her history of gastric ulcer. She reports that Tylenol brings her very little relief. She requests another steroid injection today.  We discussed that her obesity was likely contributing to the acceleration of her degenerative joint disease and discussed that there was also no true way to reverse this process which would ultimately lead in joint replacement. We discussed that pain management and steroid injections were sufficient temporizing measures but would at some point become insufficient to control her pain and disability. She is understandable and wishes to proceed with the steroid injection today.  Plan: Steroid injection performed in clinic today. We will consider adding Celebrex for better control with nonsteroidal anti-inflammatory properties which would be more gentle on her GI tract given her history. We would like to more fully evaluate her potential cardiac risk prior to starting this medication and recommend discussing this further at future follow-up visits.

## 2016-04-24 NOTE — Patient Instructions (Signed)
We will provide you with a joint injection of steroids in her left knee. She exhibits some relief from this immediately with lidocaine as well as the effect of steroids which will kick in in 2-3 days. Please continue to take Tylenol as needed for your knee pain. Please be careful not to exceed 4000mg  per day.  We will also order an echocardiogram to evaluate the function of your heart. He should receive a call in order to help schedule this in the next several days.

## 2016-04-24 NOTE — Progress Notes (Signed)
CC: left knee pain HPI: [name] is a 56 y.o. female with a h/o of HTN, OA, anxiety who presents for evaluation of left knee pain.  Please see Problem-based charting for HPI and the status of patient's chronic medical conditions.  Past Medical History:  Diagnosis Date  . Anxiety   . Arthritis    knnes,back  . GERD (gastroesophageal reflux disease)   . History of swelling of feet   . Hyperlipidemia   . Hypertension   . Major depressive disorder, recurrent episode ([place]) 06/05/2015  . Obesity   . Snores     Social Hx: Social History  Substance Use Topics  . Smoking status: Current Every Day Smoker    Packs/day: 0.50    Years: 30.00    Types: Cigarettes  . Smokeless tobacco: Never Used     Comment: 1/2 PPD  . Alcohol use No   Review of Systems: ROS in HPI. Otherwise: Review of Systems  Constitutional: Negative for chills, fever and weight loss.  Respiratory: Negative for cough and shortness of breath.   Cardiovascular: Positive for orthopnea, leg swelling and PND. Negative for chest pain and palpitations.  Gastrointestinal: Negative for abdominal pain, constipation, diarrhea, nausea and vomiting.  Genitourinary: Negative for dysuria, frequency and urgency.  Musculoskeletal: Positive for joint pain (left knee).  Neurological: Negative for dizziness and headaches.  Psychiatric/Behavioral: The patient has insomnia.     Physical Exam: Vitals:   04/24/16 1405  BP: 121/78  Pulse: 66  Temp: 98.6 F (37 C)  TempSrc: Oral  SpO2: 100%  Weight: 267 lb (121.1 kg)   Physical Exam  Constitutional: She appears well-developed. She is cooperative. No distress.  Obese  Cardiovascular: Normal rate, regular rhythm, normal heart sounds and normal pulses.  Exam reveals no gallop.   No murmur heard. Pulmonary/Chest: Effort normal and breath sounds normal. No respiratory distress. She has no wheezes. She has no rhonchi. She has no rales. Breasts are symmetrical.    Abdominal: Soft. Bowel sounds are normal. There is no tenderness.  Musculoskeletal: She exhibits edema (trace edema bilaterally) and tenderness (left knee). She exhibits no deformity.  No effusion noted of left knee, exam limited by body habitus. Some TTP and w/ active ROM.    Assessment & Plan:  See encounters tab for problem based medical decision making. Patient seen with Dr. Angelia Mould  Leg swelling Patient reports the swelling significantly improved on her current dose of furosemide 40 mg. Daily her leg swelling may be consistent with venous stasis that she does have some classic skin changes overlying her bilateral lower extremities around the ankles. She has trace pitting edema on exam today. However her symptoms may also be related to underlying cardiac dysfunction and I would recommend further workup. Please see assessment and plan under dyspnea for further workup and management.  Osteoarthritis, multiple sites Patient presents with complaints of left knee pain consistent with previous osteoarthritic pain in the past. She has received multiple sets of steroid injections in other joints including her back and shoulders. She also received a steroid injection in the left knee proximally 6 months ago by Dr. Heber [place] in the clinic. She reports that this injection gave her significant relief for about 2-1/2 months. Since that time she's been taking Tylenol 4-5 g daily. She avoids NSAIDs due to her history of gastric ulcer. She reports that Tylenol brings her very little relief. She requests another steroid injection today.  We discussed that her obesity was likely  contributing to the acceleration of her degenerative joint disease and discussed that there was also no true way to reverse this process which would ultimately lead in joint replacement. We discussed that pain management and steroid injections were sufficient temporizing measures but would at some point become insufficient to control her  pain and disability. She is understandable and wishes to proceed with the steroid injection today.  Plan: Steroid injection performed in clinic today. We will consider adding Celebrex for better control with nonsteroidal anti-inflammatory properties which would be more gentle on her GI tract given her history. We would like to more fully evaluate her potential cardiac risk prior to starting this medication and recommend discussing this further at future follow-up visits.  Dyspnea Patient complains of history of orthopnea which she believes is worsening recently. She currently uses 3 pillows and has for some time. She notes significant paroxysmal nocturnal dyspnea, and wakes up frequently feeling short of breath and difficulty breathing during the night. She was evaluated with a polysomnographic which was ordered at her last visit which was negative for any obstructive sleep apnea and noted only primary snoring. Patient also complains of some primary insomnia which keeps her from falling asleep however was asleep her primary complaint is that of dyspnea.  These findings in conjunction with her history of lower show any swelling are concerning for signs of congestive heart failure. She has never had an echocardiogram evaluation and feel that she would likely benefit from this diagnostic test to rule out cardiogenic causes of her symptoms. An order has been placed for this.  Plan: Outpatient echocardiogram. Continue furosemide 40 mg daily when necessary for lower extremity swelling.   PROCEDURE NOTE  PROCEDURE: left knee joint steroid injection.  PREOPERATIVE DIAGNOSIS: Osteoarthritis of the left knee.  POSTOPERATIVE DIAGNOSIS: Osteoarthritis of the left knee.  PROCEDURE: The patient was apprised of the risks and the benefits of the procedure and informed consent was obtained, as witnessed by Dr. Angelia Mould. Time-out procedure was performed, with confirmation of the patient's name, date of  birth, and correct identification of the left knee to be injected. The patient's knee was then marked at the appropriate site for injection placement. The knee was sterilely prepped with Betadine. A 1 mg (1 milliliter) solution of Kenalog was drawn up into a 5 mL syringe with a 1 mL of 1% lidocaine. The patient was injected with a 22-gauge needle at the lateral aspect of her left flexed knee. There were no complications. The patient tolerated the procedure well. There was minimal bleeding. The patient was instructed to ice her knee upon leaving clinic and refrain from overuse over the next 3 days. The patient was instructed to go to the emergency room with any usual pain, swelling, or redness occurred in the injected area. The patient was given a followup appointment to evaluate response to the injection to his increased range of motion and reduction of pain.  The procedure was supervised by attending physician, Dr. Angelia Mould.    Signed: Holley Raring, MD 04/24/2016, 5:11 PM  Pager: [phone]

## 2016-04-24 NOTE — Assessment & Plan Note (Addendum)
Patient reports the swelling significantly improved on her current dose of furosemide 40 mg. Daily her leg swelling may be consistent with venous stasis that she does have some classic skin changes overlying her bilateral lower extremities around the ankles. She has trace pitting edema on exam today. However her symptoms may also be related to underlying cardiac dysfunction and I would recommend further workup. Please see assessment and plan under dyspnea for further workup and management.

## 2016-04-25 NOTE — Progress Notes (Signed)
Internal Medicine Clinic Attending  I saw and evaluated the patient.  I personally confirmed the key portions of the history and exam documented by Dr. Gay Filler and I reviewed pertinent patient test results.  The assessment, diagnosis, and plan were formulated together and I agree with the documentation in the resident's note. I was present for the entire procedure.

## 2016-05-06 ENCOUNTER — Encounter (HOSPITAL_COMMUNITY): Payer: Self-pay

## 2016-05-06 ENCOUNTER — Emergency Department (HOSPITAL_COMMUNITY): Payer: Medicaid Other

## 2016-05-06 ENCOUNTER — Emergency Department (HOSPITAL_COMMUNITY)
Admission: EM | Admit: 2016-05-06 | Discharge: 2016-05-07 | Disposition: A | Discharge status: Home / Self Care | Payer: Medicaid Other | Attending: Emergency Medicine | Admitting: Emergency Medicine

## 2016-05-06 DIAGNOSIS — R05 Cough: Secondary | ICD-10-CM | POA: Diagnosis not present

## 2016-05-06 DIAGNOSIS — Z79899 Other long term (current) drug therapy: Secondary | ICD-10-CM | POA: Diagnosis not present

## 2016-05-06 DIAGNOSIS — R1013 Epigastric pain: Secondary | ICD-10-CM | POA: Diagnosis not present

## 2016-05-06 DIAGNOSIS — F17210 Nicotine dependence, cigarettes, uncomplicated: Secondary | ICD-10-CM | POA: Insufficient documentation

## 2016-05-06 DIAGNOSIS — I10 Essential (primary) hypertension: Secondary | ICD-10-CM | POA: Insufficient documentation

## 2016-05-06 DIAGNOSIS — D72829 Elevated white blood cell count, unspecified: Secondary | ICD-10-CM | POA: Diagnosis not present

## 2016-05-06 DIAGNOSIS — R059 Cough, unspecified: Secondary | ICD-10-CM

## 2016-05-06 LAB — BASIC METABOLIC PANEL
Anion gap: 8 (ref 5–15)
BUN: 7 mg/dL (ref 6–20)
CALCIUM: 9.3 mg/dL (ref 8.9–10.3)
CHLORIDE: 106 mmol/L (ref 101–111)
CO2: 25 mmol/L (ref 22–32)
CREATININE: 0.9 mg/dL (ref 0.44–1.00)
GFR calc Af Amer: >60 mL/min (ref >60)
GFR calc non Af Amer: >60 mL/min (ref >60)
Glucose, Bld: 107 mg/dL — ABNORMAL HIGH (ref 65–99)
Potassium: 4 mmol/L (ref 3.5–5.1)
SODIUM: 139 mmol/L (ref 135–145)

## 2016-05-06 LAB — CBC
HCT: 28.2 % — ABNORMAL LOW (ref 36.0–46.0)
HEMOGLOBIN: 8.2 g/dL — AB (ref 12.0–15.0)
MCH: 22.1 pg — AB (ref 26.0–34.0)
MCHC: 29.1 g/dL — AB (ref 30.0–36.0)
MCV: 76 fL — ABNORMAL LOW (ref 78.0–100.0)
Platelets: 441 10*3/uL — ABNORMAL HIGH (ref 150–400)
RBC: 3.71 MIL/uL — ABNORMAL LOW (ref 3.87–5.11)
RDW: 19.4 % — AB (ref 11.5–15.5)
WBC: 21.4 10*3/uL — ABNORMAL HIGH (ref 4.0–10.5)

## 2016-05-06 LAB — POC OCCULT BLOOD, ED: Fecal Occult Bld: POSITIVE — AB

## 2016-05-06 LAB — LIPASE, BLOOD: Lipase: 25 U/L (ref 11–51)

## 2016-05-06 LAB — I-STAT CG4 LACTIC ACID, ED: LACTIC ACID, VENOUS: 0.66 mmol/L (ref 0.5–1.9)

## 2016-05-06 LAB — I-STAT TROPONIN, ED: TROPONIN I, POC: 0 ng/mL (ref 0.00–0.08)

## 2016-05-06 LAB — D-DIMER, QUANTITATIVE: D-Dimer, Quant: 0.5 ug/mL-FEU (ref 0.00–0.50)

## 2016-05-06 MED ORDER — IOPAMIDOL (ISOVUE-300) INJECTION 61%
INTRAVENOUS | Status: AC
  Administered 2016-05-06: 100 mL
  Filled 2016-05-06: qty 100

## 2016-05-06 NOTE — ED Notes (Signed)
PA-C at bedside 

## 2016-05-07 MED ORDER — ALBUTEROL SULFATE HFA 108 (90 BASE) MCG/ACT IN AERS
1.0000 | INHALATION_SPRAY | Freq: Four times a day (QID) | RESPIRATORY_TRACT | 0 refills | Status: DC | PRN
Start: 2016-05-07 — End: 2017-04-03

## 2016-05-07 MED ORDER — BENZONATATE 100 MG PO CAPS
100.0000 mg | ORAL_CAPSULE | Freq: Once | ORAL | Status: AC
  Administered 2016-05-07: 100 mg via ORAL
  Filled 2016-05-07: qty 1

## 2016-05-07 MED ORDER — BENZONATATE 100 MG PO CAPS: 100.0000 mg | ORAL_CAPSULE | Freq: Three times a day (TID) | ORAL | 0 refills | Status: DC

## 2016-05-07 NOTE — ED Notes (Signed)
Patient verbalized understanding of discharge instructions and denies any further needs or questions at this time. VS stable. Patient ambulatory with steady gait.  

## 2016-05-07 NOTE — Discharge Instructions (Signed)
You need to see your primary care doctor today at the walk in clinic. They are expecting you. Please return to the ED if you develop any worsening pain. Take the Tessalon for cough medicine and use the albuterol inhaler as needed.

## 2016-05-07 NOTE — ED Notes (Signed)
MD at bedside. 

## 2016-05-07 NOTE — ED Provider Notes (Signed)
[facility] DEPT Provider Note   CSN: [id number] Arrival date & time: 05/06/16  1635     History   Chief Complaint Chief Complaint  Patient presents with  . Cough    HPI Peggy House is a 56 y.o. female.  56 year old African-American with a past medical history significant for GERD, peptic ulcer disease, GI bleed, morbid obesity presents to the ED today with multiple complaints. Patient complains of a cough that started approximately 3 days ago. Patient states the cough is nonproductive. Patient states her husband has been having a cough as well. She denies any URI symptoms. She denies any fevers. She is not training for her symptoms at home. patient reports mild shortness of breath with the cough. She also reports mild chest pain secondary to cough. Patient denies any exertional chest pain. She was seen by her primary care doctor on 1/10 for orthopnea, nocturnal dyspnea and dyspnea. At that time the concern for possible congestive heart failure symptoms. She has a echocardiogram scheduled. Patient also endorses epigastric abdominal pain that began 2 days ago. She states this is likely related to her coughing. Patient does have history of PUD. Patient states the pain is worse with coughing. She denies any fevers, hematocrit emesis, nausea, emesis, change in bowel habits, melena, hematochezia. Patient not taking for pain. Coughing makes the pain worse. The pain is intermittent. Does not radiate. Pain is not associated with food. Patient denies any headache, vision changes, lightheadedness, dizziness, urinary symptoms, change in bowel habits, paresthesias. Patient denies any history of DVT, denies any OCPs, recent hospitalizations/surgeries, prolonged immobilizations, unilateral leg swelling, calf tenderness. Patient denies being on blood thinners. Denies any recent steroid use.       Past Medical History:  Diagnosis Date  . Anxiety   . Arthritis    knnes,back  . GERD  (gastroesophageal reflux disease)   . History of swelling of feet   . Hyperlipidemia   . Hypertension   . Major depressive disorder, recurrent episode ([place]) 06/05/2015  . Obesity   . Snores     Patient Active Problem List   Diagnosis Date Noted  . Osteoarthritis, multiple sites 06/05/2015  . Insomnia 06/05/2015  . Major depressive disorder, recurrent episode ([place]) 06/05/2015  . Morbid obesity ([place]) 04/26/2014  . Gastric AVM 04/26/2014  . GIB (gastrointestinal bleeding) 04/25/2014  . GI bleed 04/25/2014  . HLD (hyperlipidemia) 04/25/2014  . Leg swelling 04/25/2014  . Anxiety   . Arthritis   . Essential hypertension   . Dyspnea 04/08/2014  . Demand ischemia ([place]) 04/08/2014  . Acute blood loss anemia 04/07/2014    Past Surgical History:  Procedure Laterality Date  . ABDOMINAL HYSTERECTOMY    . CARPAL TUNNEL RELEASE  05/13/2011   Procedure: CARPAL TUNNEL RELEASE;  Surgeon: Nita Sells, MD;  Location: [facility];  Service: Orthopedics;  Laterality: Left;  . COLONOSCOPY WITH PROPOFOL N/A 04/28/2014   Procedure: COLONOSCOPY WITH PROPOFOL;  Surgeon: Cleotis Nipper, MD;  Location: [facility] ENDOSCOPY;  Service: Endoscopy;  Laterality: N/A;  . DG TOES*L*  2/10   rt  . DILATION AND CURETTAGE OF UTERUS    . ESOPHAGOGASTRODUODENOSCOPY N/A 04/10/2014   Procedure: ESOPHAGOGASTRODUODENOSCOPY (EGD);  Surgeon: [name], MD;  Location: [facility] ENDOSCOPY;  Service: Endoscopy;  Laterality: N/A;  . ESOPHAGOGASTRODUODENOSCOPY (EGD) WITH PROPOFOL N/A 04/27/2014   Procedure: ESOPHAGOGASTRODUODENOSCOPY (EGD) WITH PROPOFOL;  Surgeon: Cleotis Nipper, MD;  Location: [facility];  Service: Endoscopy;  Laterality: N/A;  possible apc  .  HOT HEMOSTASIS N/A 04/27/2014   Procedure: HOT HEMOSTASIS (ARGON PLASMA COAGULATION/BICAP);  Surgeon: Cleotis Nipper, MD;  Location: [facility] ENDOSCOPY;  Service: Endoscopy;  Laterality: N/A;  . L shoulder Surgery  2011    OB History    No data  available       Home Medications    Prior to Admission medications   Medication Sig Start Date End Date Taking? Authorizing Provider  acetaminophen (TYLENOL) 325 MG tablet Take 650 mg by mouth every 6 (six) hours as needed.   Yes Historical Provider, MD  atorvastatin (LIPITOR) 80 MG tablet Take 1 tablet (80 mg total) by mouth daily. 12/20/15 12/19/16 Yes Lorella Nimrod, MD  citalopram (CELEXA) 40 MG tablet Take 1 tablet (40 mg total) by mouth daily. 12/20/15  Yes Lorella Nimrod, MD  furosemide (LASIX) 40 MG tablet Take 1 tablet (40 mg total) by mouth daily. 04/24/16  Yes Holley Raring, MD  lisinopril (PRINIVIL,ZESTRIL) 10 MG tablet Take 1 tablet (10 mg total) by mouth daily. 12/20/15 12/19/16 Yes Lorella Nimrod, MD  metoprolol succinate (TOPROL-XL) 100 MG 24 hr tablet Take 1 tablet (100 mg total) by mouth daily. Take with or immediately following a meal. 12/20/15 12/19/16 Yes Lorella Nimrod, MD  pantoprazole (PROTONIX) 40 MG tablet Take 1 tablet (40 mg total) by mouth daily. 12/20/15  Yes Lorella Nimrod, MD  albuterol (PROVENTIL HFA;VENTOLIN HFA) 108 (90 Base) MCG/ACT inhaler Inhale 1-2 puffs into the lungs every 6 (six) hours as needed for wheezing or shortness of breath. 05/07/16   Doristine Devoid, PA-C  ALPRAZolam Duanne Moron) 0.5 MG tablet Take 1 tablet (0.5 mg total) by mouth 2 (two) times daily as needed for anxiety or sleep. 04/19/16   Bethany Molt, DO  benzonatate (TESSALON) 100 MG capsule Take 1 capsule (100 mg total) by mouth every 8 (eight) hours. 05/07/16   Doristine Devoid, PA-C  Iron-FA-B Cmp-C-Biot-Probiotic (FUSION PLUS) CAPS Take 1 capsule by mouth daily. Patient not taking: Reported on 05/06/2016 12/20/15   Lorella Nimrod, MD    Family History Family History  Problem Relation Age of Onset  . Cancer Mother   . Ovarian cancer Mother   . Diabetes type II Sister   . Diabetes Sister   . Colon cancer Maternal Grandfather   . Stomach cancer Maternal Grandmother   . Dysmenorrhea Neg Hx     Social  History Social History  Substance Use Topics  . Smoking status: Current Every Day Smoker    Packs/day: 0.50    Years: 30.00    Types: Cigarettes  . Smokeless tobacco: Never Used     Comment: 1/2 PPD  . Alcohol use No     Allergies   Nsaids and Tomato   Review of Systems Review of Systems  Constitutional: Negative for chills and fever.  HENT: Negative for congestion, ear pain, postnasal drip, rhinorrhea and sore throat.   Eyes: Negative for visual disturbance.  Respiratory: Positive for cough and shortness of breath. Negative for wheezing.   Cardiovascular: Positive for chest pain (secondary to cough). Negative for palpitations and leg swelling.  Gastrointestinal: Positive for abdominal pain (secondary to cough). Negative for blood in stool, diarrhea, nausea and vomiting.  Genitourinary: Negative for dysuria, frequency, hematuria and urgency.  Neurological: Negative for dizziness, syncope, weakness, light-headedness, numbness and headaches.  All other systems reviewed and are negative.    Physical Exam Updated Vital Signs BP 134/76 (BP Location: Right Arm)   Pulse 68   Temp 98.5 F (36.9 C) (Oral)  Resp 18   SpO2 99%   Physical Exam  Constitutional: She appears well-developed and well-nourished. No distress.  HENT:  Head: Normocephalic and atraumatic.  Right Ear: Tympanic membrane, external ear and ear canal normal.  Left Ear: Tympanic membrane, external ear and ear canal normal.  Nose: Nose normal. No mucosal edema or rhinorrhea.  Mouth/Throat: Uvula is midline, oropharynx is clear and moist and mucous membranes are normal.  Eyes: Conjunctivae and EOM are normal. Pupils are equal, round, and reactive to light. Right eye exhibits no discharge. Left eye exhibits no discharge. No scleral icterus.  Neck: Normal range of motion. Neck supple. No thyromegaly present.  Cardiovascular: Normal rate, regular rhythm, normal heart sounds and intact distal pulses.   The patient  is not tachycardic.  Pulmonary/Chest: Effort normal and breath sounds normal. No respiratory distress. She has no wheezes. She exhibits no tenderness.  Patient is not hypoxic or tachypneic. Clear to auscultation bilaterally.  Abdominal: Soft. Bowel sounds are normal. She exhibits no distension. There is tenderness in the epigastric area and left upper quadrant. There is no rigidity, no rebound, no guarding, no CVA tenderness, no tenderness at McBurney's point and negative Murphy's sign.  Genitourinary:  Genitourinary Comments: Chaperone present for exam. Patient tolerated procedure well. Soft brown stool noted in the rectal vault. Hemoccult positive. No signs of frank red blood or melena. Normal rectal tone.  Musculoskeletal: Normal range of motion.  Lymphadenopathy:    She has no cervical adenopathy.  Neurological: She is alert.  Skin: Skin is warm and dry. Capillary refill takes less than 2 seconds.  Nursing note and vitals reviewed.    ED Treatments / Results  Labs (all labs ordered are listed, but only abnormal results are displayed) Labs Reviewed  CBC - Abnormal; Notable for the following:       Result Value   WBC 21.4 (*)    RBC 3.71 (*)    Hemoglobin 8.2 (*)    HCT 28.2 (*)    MCV 76.0 (*)    MCH 22.1 (*)    MCHC 29.1 (*)    RDW 19.4 (*)    Platelets 441 (*)    All other components within normal limits  BASIC METABOLIC PANEL - Abnormal; Notable for the following:    Glucose, Bld 107 (*)    All other components within normal limits  POC OCCULT BLOOD, ED - Abnormal; Notable for the following:    Fecal Occult Bld POSITIVE (*)    All other components within normal limits  LIPASE, BLOOD  D-DIMER, QUANTITATIVE (NOT AT [facility])  I-STAT CG4 LACTIC ACID, ED  Randolm Idol, ED    EKG  EKG Interpretation None       Radiology Dg Chest 2 View  Result Date: 05/06/2016 CLINICAL DATA:  Persistent cough and nausea with vomiting. EXAM: CHEST  2 VIEW COMPARISON:  04/07/2014  FINDINGS: Cardiopericardial silhouette is at upper limits of normal for size. The lungs are clear wiithout focal pneumonia, edema, pneumothorax or pleural effusion. The visualized bony structures of the thorax are intact. IMPRESSION: No active cardiopulmonary disease. Electronically Signed   By: Misty Stanley M.D.   On: 05/06/2016 18:00   Ct Abdomen Pelvis W Contrast  Result Date: 05/07/2016 CLINICAL DATA:  Acute onset of left lower quadrant abdominal pain. Initial encounter. EXAM: CT ABDOMEN AND PELVIS WITH CONTRAST TECHNIQUE: Multidetector CT imaging of the abdomen and pelvis was performed using the standard protocol following bolus administration of intravenous contrast. CONTRAST:  100 mL  ISOVUE-300 IOPAMIDOL (ISOVUE-300) INJECTION 61% COMPARISON:  MRI of the lumbar spine performed 11/08/2014, and CT of the abdomen and pelvis from 11/23/2008 FINDINGS: Lower chest: Minimal bibasilar atelectasis is noted. Hepatobiliary: There is a diffusely heterogeneous appearance to the liver, concerning for hepatic venous congestion. No dominant mass is seen within the liver. The gallbladder is unremarkable in appearance. The common bile duct remains normal in caliber. Pancreas: The pancreas is within normal limits. Spleen: The spleen is unremarkable in appearance. Adrenals/Urinary Tract: The adrenal glands are unremarkable in appearance. Mild scarring is noted at the left kidney. A small right renal cyst is seen. There is no evidence of hydronephrosis. No renal or ureteral stones are identified. No perinephric stranding is seen. Stomach/Bowel: The stomach is unremarkable in appearance. The small bowel is within normal limits. The appendix is normal in caliber, without evidence of appendicitis. The colon is unremarkable in appearance. Vascular/Lymphatic: Scattered calcification is seen along the abdominal aorta and its branches. The abdominal aorta is otherwise grossly unremarkable. The inferior vena cava is grossly  unremarkable. No retroperitoneal lymphadenopathy is seen. No pelvic sidewall lymphadenopathy is identified. Reproductive: The bladder is mildly distended and grossly unremarkable. The patient is status post hysterectomy. No suspicious adnexal masses are seen. The left ovary is unremarkable in appearance. Other: No additional soft tissue abnormalities are seen. Musculoskeletal: No acute osseous abnormalities are identified. The visualized musculature is unremarkable in appearance. IMPRESSION: 1. No acute abnormality seen to explain the patient's symptoms. 2. Scattered aortic atherosclerosis noted. 3. Mild scarring at the left kidney.  Small right renal cyst seen. 4. Diffusely heterogeneous appearance of the liver raises concern for hepatic venous congestion. Electronically Signed   By: Garald Balding M.D.   On: 05/07/2016 00:33    Procedures Procedures (including critical care time)  Medications Ordered in ED Medications  benzonatate (TESSALON) capsule 100 mg (not administered)  iopamidol (ISOVUE-300) 61 % injection (100 mLs  Contrast Given 05/06/16 2351)     Initial Impression / Assessment and Plan / ED Course  I have reviewed the triage vital signs and the nursing notes.  Pertinent labs & imaging results that were available during my care of the patient were reviewed by me and considered in my medical decision making (see chart for details).     Patient presents to the ED with complaints of cough and epigastric pain. Patient denies any other URI symptoms. She is afebrile. Chest x-ray shows no focal infiltrate. Lungs are clear to auscultation bilaterally. Patient does have a leukocytosis of 21. Denies any recent steroid use. She denies any other infectious symptoms including diarrhea, urinary symptoms. Cough is nonproductive. Patient reports mild shortness of breath with cough. Denies any shortness of breath with exertion. Patient was recently seen by primary care doctor for dyspnea. Has a echo  scheduled an outpatient setting. D-dimer was negative. Troponin was negative. EKG without any acute changes. Patient also endorses mild epigastric pain. States is likely secondary to cough. Pain is exacerbated by cough. Patient does have history of PUD. Hemoglobin was noted be 8.5 in the ED. She denies any melena or hematochezia. Rectal exam revealed Hemoccult positive stool. No frank red blood or melena noted. Patient is baseline hemoglobin over the past year has been 10. Prior to that she had a hemoglobin of 9. I have low suspicion that patient is symptomatic with this hemoglobin. Feel that shortness of breath is related to her cough. Abdominal exam is benign. CT of abdomen is unremarkable for any acute findings. The  patient is not hypoxic, tachypneic, or tachycardic. She is normotensive. Have noted expiration for patient's leukocytosis. Lactic acid was normal. Patient remains afebrile and not tachycardic in the ED. Low suspicion for acute GI bleed. Doubt that patient's symptoms are related to her 2 point drop in hemoglobin. Talked with the patient about possible need for admission. Patient was pretty adamant about not being admitted. Had shared decision making with patient and offered admission. Patient continues to state that she does not want to stay.. Feel the patient is hemodynamically stable and do not feel that admission is necessary. Patient does need close follow-up. Spoke with the internal medicine teaching service about admission. She states the patient can be seen in the walk-in clinic this morning in the office. Patient is agreeable to this plan. Patient continues to be hemodynamically stable. She is not tachycardic, hypotensive, hypoxic, tachypneic. We'll treat for her cough. Discussed with patient strict return precautions. Pt is hemodynamically stable, in NAD, & able to ambulate in the ED. Pain has been managed & has no complaints prior to dc. Pt is comfortable with above plan and is stable for  discharge at this time. All questions were answered prior to disposition. Strict return precautions for f/u to the ED were discussed. Pt was seen and evaluated by Dr. Dayna Barker who is agreeable to the above plan.   Final Clinical Impressions(s) / ED Diagnoses   Final diagnoses:  Cough  Epigastric pain  Leukocytosis, unspecified type    New Prescriptions New Prescriptions   ALBUTEROL (PROVENTIL HFA;VENTOLIN HFA) 108 (90 BASE) MCG/ACT INHALER    Inhale 1-2 puffs into the lungs every 6 (six) hours as needed for wheezing or shortness of breath.   BENZONATATE (TESSALON) 100 MG CAPSULE    Take 1 capsule (100 mg total) by mouth every 8 (eight) hours.     Doristine Devoid, PA-C 05/07/16 0149    Doristine Devoid, PA-C 05/07/16 0205    Merrily Pew, MD 05/07/16 1250

## 2016-05-09 ENCOUNTER — Ambulatory Visit: Payer: Medicaid Other

## 2016-05-13 ENCOUNTER — Ambulatory Visit (INDEPENDENT_AMBULATORY_CARE_PROVIDER_SITE_OTHER): Payer: Medicaid Other | Admitting: Internal Medicine

## 2016-05-13 DIAGNOSIS — Z8719 Personal history of other diseases of the digestive system: Secondary | ICD-10-CM

## 2016-05-13 DIAGNOSIS — R05 Cough: Secondary | ICD-10-CM

## 2016-05-13 DIAGNOSIS — F17210 Nicotine dependence, cigarettes, uncomplicated: Secondary | ICD-10-CM | POA: Diagnosis not present

## 2016-05-13 DIAGNOSIS — D72829 Elevated white blood cell count, unspecified: Secondary | ICD-10-CM | POA: Diagnosis not present

## 2016-05-13 DIAGNOSIS — Z862 Personal history of diseases of the blood and blood-forming organs and certain disorders involving the immune mechanism: Secondary | ICD-10-CM | POA: Diagnosis not present

## 2016-05-13 DIAGNOSIS — J069 Acute upper respiratory infection, unspecified: Secondary | ICD-10-CM

## 2016-05-13 DIAGNOSIS — R5383 Other fatigue: Secondary | ICD-10-CM | POA: Diagnosis not present

## 2016-05-13 DIAGNOSIS — R059 Cough, unspecified: Secondary | ICD-10-CM | POA: Insufficient documentation

## 2016-05-13 DIAGNOSIS — K31819 Angiodysplasia of stomach and duodenum without bleeding: Secondary | ICD-10-CM

## 2016-05-13 MED ORDER — GUAIFENESIN ER 600 MG PO TB12: 600.0000 mg | ORAL_TABLET | Freq: Two times a day (BID) | ORAL | 2 refills | Status: DC

## 2016-05-13 NOTE — Progress Notes (Signed)
   CC: Cough  HPI:  [name] is a 56 y.o. female with a past medical history listed below here today with complaints of cough.  For details of today's visit and the status of her chronic medical issues please refer to the assessment and plan.   Past Medical History:  Diagnosis Date  . Anxiety   . Arthritis    knnes,back  . GERD (gastroesophageal reflux disease)   . History of swelling of feet   . Hyperlipidemia   . Hypertension   . Major depressive disorder, recurrent episode ([place]) 06/05/2015  . Obesity   . Snores     Review of Systems:   See HPI  Physical Exam:  Vitals:   05/13/16 1604  BP: (!) 116/53  Pulse: (!) 56  Temp: 98.6 F (37 C)  TempSrc: Oral  SpO2: 100%  Weight: 261 lb 12.8 oz (118.8 kg)  Height: 5\' 5"  (1.651 m)   Physical Exam  Constitutional: She is well-developed, well-nourished, and in no distress. No distress.  Cardiovascular: Normal rate, regular rhythm and normal heart sounds.   Pulmonary/Chest: Effort normal and breath sounds normal. No respiratory distress. She has no wheezes.  Abdominal: Soft. Bowel sounds are normal. She exhibits no distension. There is no tenderness. There is no rebound.  Skin: Skin is warm and dry.     Assessment & Plan:   See Encounters Tab for problem based charting.  Patient discussed with Dr. Dareen Piano

## 2016-05-13 NOTE — Patient Instructions (Addendum)
Ms. Justine Null had a viral upper respiratory infection that made you feel sick initially. Those symptoms are all improving but it will take a while for everything to resolve completely. I would like you to fill the prescriptions the emergency room gave you and I will send in a prescription for mucinex as well.   Please resume the Iron pills  I am checking some blood work on you today. Please follow up in 1-2 weeks.

## 2016-05-13 NOTE — Assessment & Plan Note (Signed)
Coughing x 2 weeks. Productive with white sputum. No change in color but reports iIncreased sputum production. Sore throat x 1 week. Subjective fevers at home, chills. No sinus congestion or pressure. Right jaw and ear pain. No difficulties hearing. No rashes. No nausea or vomiting currently. Had diarrhea but resolved. No melena or hematochezia. +fatigue. No myalgias but does report some at the beginning of her symptoms. No sick contacts.  Reports some dyspnea.   Assessment: Recent viral URI with cough  Plan: Conservative therapies cough, no signs of secondary bacterial infection Discussed sinus irrigation. Will give Rx for mucinex and tessalon.  If persists, patient is on an ACE-I  And could switch to ARB

## 2016-05-13 NOTE — Assessment & Plan Note (Signed)
Has a history of leukocytosis without explanation. Appears to have resolved in 2016 but was again present in 2017 on all of her cbcs. Currently no signs of infection. Afebrile.   Assessment: leukocytosis  Plan: CBC with diff today Smear

## 2016-05-13 NOTE — Assessment & Plan Note (Addendum)
Patient was seen in the ED on 1/22 with cough and epigastric pain. CXR at that time was negative for any acute infiltrates; personally reviewed and agree with interpretation. CBC at that time was notable for hgb of 8.5, 2 point drop in Hgb in the past 4 months. She denied any melena or hematochezia then but FOBT was positive. Abdominal CT was done and unremarkable. Vital signs stable.   Today, she denies any melena or hematochezia. Denies any abdominal pain, nausea/vomiting. Does report some NSAID uses. Reports she has been off Fe supplements for several months. Notes generalized fatigue.  Assessment: hx of gastric AVM and recent positive fobt  Plan: CBC today  Restart Fe supplement Follow up with GI

## 2016-05-14 LAB — CBC WITH DIFFERENTIAL/PLATELET
BASOS ABS: 0.1 10*3/uL (ref 0.0–0.2)
Basos: 0 %
EOS (ABSOLUTE): 0.2 10*3/uL (ref 0.0–0.4)
Eos: 1 %
Hematocrit: 28.4 % — ABNORMAL LOW (ref 34.0–46.6)
Hemoglobin: 8.3 g/dL — ABNORMAL LOW (ref 11.1–15.9)
Immature Grans (Abs): 0 10*3/uL (ref 0.0–0.1)
Immature Granulocytes: 0 %
LYMPHS ABS: 4.3 10*3/uL — AB (ref 0.7–3.1)
Lymphs: 20 %
MCH: 21.6 pg — AB (ref 26.6–33.0)
MCHC: 29.2 g/dL — ABNORMAL LOW (ref 31.5–35.7)
MCV: 74 fL — ABNORMAL LOW (ref 79–97)
MONOCYTES: 5 %
Monocytes Absolute: 1 10*3/uL — ABNORMAL HIGH (ref 0.1–0.9)
NEUTROS PCT: 74 %
Neutrophils Absolute: 16.1 10*3/uL — ABNORMAL HIGH (ref 1.4–7.0)
Platelets: 514 10*3/uL — ABNORMAL HIGH (ref 150–379)
RBC: 3.84 x10E6/uL (ref 3.77–5.28)
RDW: 18.8 % — ABNORMAL HIGH (ref 12.3–15.4)
WBC: 21.8 10*3/uL — AB (ref 3.4–10.8)

## 2016-05-15 NOTE — Progress Notes (Signed)
Internal Medicine Clinic Attending  Case discussed with Dr. Charlynn Grimes at the time of the visit.  We reviewed the resident's history and exam and pertinent patient test results.  I agree with the assessment, diagnosis, and plan of care documented in the resident's note.  Hg remains stable but WBC remains elevated. It's etiology remains uncertain. I spoke with Dr. Charlynn Grimes who will discuss this with hematology.

## 2016-05-23 LAB — PATHOLOGIST SMEAR REVIEW
Basophils Absolute: 0.1 10*3/uL (ref 0.0–0.2)
Basos: 0 %
EOS (ABSOLUTE): 0.2 10*3/uL (ref 0.0–0.4)
Eos: 1 %
Hematocrit: 28.9 % — ABNORMAL LOW (ref 34.0–46.6)
Hemoglobin: 8.4 g/dL — ABNORMAL LOW (ref 11.1–15.9)
Immature Grans (Abs): 0 10*3/uL (ref 0.0–0.1)
Immature Granulocytes: 0 %
LYMPHS: 21 %
Lymphocytes Absolute: 4.5 10*3/uL — ABNORMAL HIGH (ref 0.7–3.1)
MCH: 21.5 pg — ABNORMAL LOW (ref 26.6–33.0)
MCHC: 29.1 g/dL — ABNORMAL LOW (ref 31.5–35.7)
MCV: 74 fL — ABNORMAL LOW (ref 79–97)
MONOCYTES: 4 %
Monocytes Absolute: 0.8 10*3/uL (ref 0.1–0.9)
Neutrophils Absolute: 16.3 10*3/uL — ABNORMAL HIGH (ref 1.4–7.0)
Neutrophils: 74 %
Platelets: 518 10*3/uL — ABNORMAL HIGH (ref 150–379)
RBC: 3.91 x10E6/uL (ref 3.77–5.28)
RDW: 18.7 % — ABNORMAL HIGH (ref 12.3–15.4)
WBC: 21.9 10*3/uL (ref 3.4–10.8)

## 2016-06-06 ENCOUNTER — Other Ambulatory Visit: Payer: Self-pay | Admitting: Internal Medicine

## 2016-06-07 NOTE — Telephone Encounter (Signed)
Needs MAy appt Molt - benzo use

## 2016-06-07 NOTE — Telephone Encounter (Signed)
Message sent to front office to schedule pt an appt. 

## 2016-06-10 ENCOUNTER — Other Ambulatory Visit: Payer: Self-pay

## 2016-06-10 NOTE — Telephone Encounter (Signed)
ALPRAZolam (XANAX) 0.5 MG tablet, citalopram (CELEXA) 40 MG tablet, metoprolol succinate (TOPROL-XL) 100 MG 24 hr tablet, pantoprazole (PROTONIX) 40 MG tablet, and  Amlodipine to be filled @ [facility] on [address].

## 2016-06-14 ENCOUNTER — Other Ambulatory Visit: Payer: Self-pay | Admitting: Internal Medicine

## 2016-06-14 MED ORDER — METOPROLOL SUCCINATE ER 100 MG PO TB24: 100.0000 mg | ORAL_TABLET | Freq: Every day | ORAL | 0 refills | Status: DC

## 2016-06-14 MED ORDER — ALPRAZOLAM 0.5 MG PO TABS: 0.5000 mg | ORAL_TABLET | Freq: Two times a day (BID) | ORAL | 0 refills | Status: DC | PRN

## 2016-06-14 MED ORDER — PANTOPRAZOLE SODIUM 40 MG PO TBEC: 40.0000 mg | DELAYED_RELEASE_TABLET | Freq: Every day | ORAL | 0 refills | Status: DC

## 2016-06-14 NOTE — Telephone Encounter (Signed)
Pt requesting a refill on her ALPRAZolam (XANAX) 0.5 MG tablet

## 2016-06-14 NOTE — Telephone Encounter (Signed)
Xanax rx called to [facility] pharmacy.

## 2016-06-14 NOTE — Telephone Encounter (Signed)
Xanax rx called to [facility] pharmacy. Pt called - no answer; left message rx was called to her pharmacy.

## 2016-06-14 NOTE — Telephone Encounter (Signed)
Peggy House! Would you mind calling in the Xanax? Thanks so much!

## 2016-06-20 ENCOUNTER — Other Ambulatory Visit: Payer: Self-pay

## 2016-06-20 NOTE — Telephone Encounter (Signed)
Amlodipine was discontinued 12/2015 Pt informed She ask about lipitor and confirmed w/ pharm, they state it is ready for pickup Pt was informed of all

## 2016-06-20 NOTE — Telephone Encounter (Signed)
Requesting amlodipine to be filled. Please call pt back.

## 2016-09-10 ENCOUNTER — Encounter: Payer: Medicaid Other | Admitting: Internal Medicine

## 2016-09-21 ENCOUNTER — Other Ambulatory Visit: Payer: Self-pay | Admitting: Internal Medicine

## 2016-09-25 NOTE — Telephone Encounter (Signed)
Rx phoned into pharmacy.  Per pharmacist, rx can only be filled for 30-supply and not a 53mth supply.  Rx phoned in as 30-day supply with 2 additional refills.  No further action needed. Phone call complete.Regenia Skeeter, Burman Bruington Cassady6/13/20189:15 AM

## 2016-10-28 NOTE — Patient Instructions (Addendum)
It was a pleasure seeing you today!  You have persistently elevated blood counts for the past several years. This can be associated with several different things including infection however you do not seem to have signs of a really persistent infection. In addition, we would expect this to resolve by now. Because of this, I am referring you to a Hematologist. These are doctors who specialize in disorders of the blood. It is important that you go to this appointment. I am also ordering some blood tests as well. I have sent in a prescription for iron to your pharmacy.   You are behind on your routine health screening. You need a mammogram soon and I have referred you for one. Please get this at your earliest convenience.   With your history of GI bleed, it is important that you see a GI physician. You also require repeat colonoscopy.   Today we also talked about depression. I'm sorry the Celexa is not working for you. I am prescribing you a new antidepressant called Prozac. This medicine should help you with depression, anxiety and sleep.   I'm sorry the Xanax doesn't seem to be working as well. I have given you a refill but this time only take this before bed for sleep.

## 2016-10-28 NOTE — Progress Notes (Signed)
   CC: follow-up of persistent leukocytosis, HTN  HPI:  [name] is a 56 y.o. F with medical history as outlined below who presents for follow-up evaluation of her chronic medical conditions.   Leukocytosis, Thrombocytosis:Today endorsed significant nosebleeds throughout her life. Family history of the same. She denies any fevers, chills, productive cough, dysuria, diarrhea or skin infections. Endorses fatigue and SOB.  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   HTN: Lisinopril 10mg , Metop 100mg , lasix 40 mg prn. Has not taken BP meds since Sunday.   HLD: Atorvastatin 80 mg  Depression: Celexa 40 mg daily, Xanax 0.5mg  PO BID PRN. Finding that Xanax doesn't seem to help her sleep anymore and didn't notice improvement with Celexa. Endorses low mood and lack of interest in activities she used to enjoy.  Past Medical History:  Diagnosis Date  . Anxiety   . Arthritis    knnes,back  . GERD (gastroesophageal reflux disease)   . History of swelling of feet   . Hyperlipidemia   . Hypertension   . Major depressive disorder, recurrent episode ([place]) 06/05/2015  . Obesity   . Snores    Review of Systems: Please see HPI.  Physical Exam: General: Alert, in no acute distress. Pleasant and conversant. Does appear fatigued.  HEENT: No icterus, injection or ptosis. No hoarseness or dysarthria  Cardiac: RRR, no MGR appreciated Pulmonary: CTA BL with normal WOB on RA. Able to speak in complete sentences Abd: Soft, non-tended. +bs Extremities: Warm, perfused. No significant pedal edema.   Vitals:   10/29/16 1339 10/29/16 1404  BP: (!) 165/83 (!) 157/79  Pulse: 76 72  Temp: 98.1 F (36.7 C)   TempSrc: Oral   SpO2: 100%   Weight: 271 lb 1.6 oz (123 kg)   Height: 5\' 5"  (1.651 m)    Body mass index is 45.11 kg/m.  Assessment & Plan:   See Encounters Tab for problem based  charting.  Patient discussed with Dr. Daryll Drown

## 2016-10-29 ENCOUNTER — Ambulatory Visit (INDEPENDENT_AMBULATORY_CARE_PROVIDER_SITE_OTHER): Payer: Medicaid Other | Admitting: Internal Medicine

## 2016-10-29 VITALS — BP 157/79 | HR 72 | Temp 98.1°F | Ht 65.0 in | Wt 271.1 lb

## 2016-10-29 DIAGNOSIS — I10 Essential (primary) hypertension: Secondary | ICD-10-CM

## 2016-10-29 DIAGNOSIS — F331 Major depressive disorder, recurrent, moderate: Secondary | ICD-10-CM

## 2016-10-29 DIAGNOSIS — D72829 Elevated white blood cell count, unspecified: Secondary | ICD-10-CM

## 2016-10-29 DIAGNOSIS — F339 Major depressive disorder, recurrent, unspecified: Secondary | ICD-10-CM | POA: Diagnosis not present

## 2016-10-29 DIAGNOSIS — Z79899 Other long term (current) drug therapy: Secondary | ICD-10-CM

## 2016-10-29 DIAGNOSIS — Z832 Family history of diseases of the blood and blood-forming organs and certain disorders involving the immune mechanism: Secondary | ICD-10-CM

## 2016-10-29 DIAGNOSIS — K922 Gastrointestinal hemorrhage, unspecified: Secondary | ICD-10-CM | POA: Diagnosis not present

## 2016-10-29 DIAGNOSIS — E785 Hyperlipidemia, unspecified: Secondary | ICD-10-CM | POA: Diagnosis not present

## 2016-10-29 DIAGNOSIS — F17210 Nicotine dependence, cigarettes, uncomplicated: Secondary | ICD-10-CM

## 2016-10-29 DIAGNOSIS — D509 Iron deficiency anemia, unspecified: Secondary | ICD-10-CM | POA: Insufficient documentation

## 2016-10-29 DIAGNOSIS — D473 Essential (hemorrhagic) thrombocythemia: Secondary | ICD-10-CM

## 2016-10-29 DIAGNOSIS — Z0000 Encounter for general adult medical examination without abnormal findings: Secondary | ICD-10-CM

## 2016-10-29 MED ORDER — ALPRAZOLAM 0.5 MG PO TABS: 0.5000 mg | ORAL_TABLET | Freq: Every evening | ORAL | 0 refills | Status: DC | PRN

## 2016-10-29 MED ORDER — FLUOXETINE HCL 20 MG PO CAPS: 20.0000 mg | ORAL_CAPSULE | Freq: Every day | ORAL | 2 refills | Status: DC

## 2016-10-29 MED ORDER — MELATONIN 5 MG PO TABS: 5.0000 mg | ORAL_TABLET | Freq: Every day | ORAL | 3 refills | Status: DC

## 2016-10-29 MED ORDER — IRON 325 (65 FE) MG PO TABS: 1.0000 | ORAL_TABLET | Freq: Every day | ORAL | 6 refills | Status: DC

## 2016-10-29 MED ORDER — AMLODIPINE BESYLATE 5 MG PO TABS: 5.0000 mg | ORAL_TABLET | Freq: Every day | ORAL | 1 refills | Status: DC

## 2016-10-30 DIAGNOSIS — Z0000 Encounter for general adult medical examination without abnormal findings: Secondary | ICD-10-CM | POA: Insufficient documentation

## 2016-10-30 LAB — CBC WITH DIFFERENTIAL/PLATELET
Basophils Absolute: 0 10*3/uL (ref 0.0–0.2)
Basos: 0 %
EOS (ABSOLUTE): 0.2 10*3/uL (ref 0.0–0.4)
Eos: 1 %
HEMATOCRIT: 31.6 % — AB (ref 34.0–46.6)
Hemoglobin: 9.4 g/dL — ABNORMAL LOW (ref 11.1–15.9)
IMMATURE GRANULOCYTES: 0 %
Immature Grans (Abs): 0 10*3/uL (ref 0.0–0.1)
LYMPHS ABS: 2.8 10*3/uL (ref 0.7–3.1)
Lymphs: 22 %
MCH: 23.7 pg — ABNORMAL LOW (ref 26.6–33.0)
MCHC: 29.7 g/dL — ABNORMAL LOW (ref 31.5–35.7)
MCV: 80 fL (ref 79–97)
Monocytes Absolute: 0.6 10*3/uL (ref 0.1–0.9)
Monocytes: 5 %
Neutrophils Absolute: 8.9 10*3/uL — ABNORMAL HIGH (ref 1.4–7.0)
Neutrophils: 72 %
PLATELETS: 350 10*3/uL (ref 150–379)
RBC: 3.97 x10E6/uL (ref 3.77–5.28)
RDW: 18.3 % — ABNORMAL HIGH (ref 12.3–15.4)
WBC: 12.5 10*3/uL — AB (ref 3.4–10.8)

## 2016-10-30 LAB — IRON AND TIBC
IRON: 27 ug/dL (ref 27–159)
Iron Saturation: 10 % — ABNORMAL LOW (ref 15–55)
TIBC: 284 ug/dL (ref 250–450)
UIBC: 257 ug/dL (ref 131–425)

## 2016-10-30 NOTE — Assessment & Plan Note (Signed)
Hx of GI bleed with recent bloody BMs and +FOBT. Hasnt appreciated blood in stool for several weeks. She is aware she needs to be seen by GI for FU and repeat colonoscopy and states she will call Dr. Cristina Gong to arrange.

## 2016-10-30 NOTE — Assessment & Plan Note (Signed)
She is visibly down on exam today with low mood and constricted affect. In addition, she has felt unmotivated to take her medications and also has lost interest in some of her favorite activities, including going to church, which was previously a large part of her life. She also has low energy, decreased appetite and difficulty sleeping. She feels Xanax is no longer helping and that Celexa never really made a difference.  -PHQ-9 today 22 -D/c Celexa -Start Prozac 20 mg QHS, patient to follow-up in 1-2 months to evaluate response. If patient notices any improvement but desires more, please increase to 40 mg daily.  -Encouraged patient outreach to friends, family and attempted return to church as able.

## 2016-10-30 NOTE — Assessment & Plan Note (Signed)
Unclear etiology. Dating back to at least 2009 however resolved briefly 2016. Smear 0/[id number] with neutrophilic leukocytosis and mild lymphocytosis with mature cells and reactive changes. RBCs with microcytic, hypochromic anemia with anisocytosis. Thrombocytosis. Has never seen a hematologist. She has no infectious symptoms today. Endorses fatigue, shortness of breath and in addition reports today that she has had significant nosebleeds throughout her life. Family history of similar bleeding issues. At this point, believe it would be prudent to see hematology for further evaluation.  -Refer to hematology -CBC today  UPDATE: Still with persistent leukocytosis on CBC.

## 2016-10-30 NOTE — Assessment & Plan Note (Signed)
Patient has history of iron deficiency anemia and persistent microcytic anemia. Has not been taking iron supplements as recommended as she felt the ones she got at the drug store werent working.  -Iron panel today -Iron suppl Rx sent to pharmacy, strongly encouraged use  UPDATE: Iron level wnl. Continue supplementation given patients persistent anemia and epistaxis.

## 2016-10-30 NOTE — Assessment & Plan Note (Addendum)
Hypertensive x2 today however patient has not taken her blood pressure medications for several days as she felt unmotiviated to take her meds due to low mood. Encouraged strict compliance with her medications and ensured refills were available. Will have patient back in 1-2 months to evaluate BP control.  -If uncontrolled at follow up, consider starting Amlodipine

## 2016-10-30 NOTE — Assessment & Plan Note (Signed)
Referred for and encouraged screening mammogram.

## 2016-10-31 ENCOUNTER — Other Ambulatory Visit: Payer: Self-pay | Admitting: *Deleted

## 2016-10-31 MED ORDER — FUROSEMIDE 40 MG PO TABS: 40.0000 mg | ORAL_TABLET | Freq: Every day | ORAL | 2 refills | Status: DC

## 2016-11-02 NOTE — Progress Notes (Signed)
Internal Medicine Clinic Attending  Case discussed with Dr. Molt at the time of the visit.  We reviewed the resident's history and exam and pertinent patient test results.  I agree with the assessment, diagnosis, and plan of care documented in the resident's note. 

## 2016-11-21 ENCOUNTER — Telehealth: Payer: Self-pay | Admitting: Hematology and Oncology

## 2016-11-21 ENCOUNTER — Telehealth: Payer: Self-pay | Admitting: *Deleted

## 2016-11-21 NOTE — Telephone Encounter (Signed)
CALLED PATIENT WITH HEMATOLOGY APPOINTMENT. AUGUST 16.018 / ARRIVE 1:30 PM.  [facility] [place] ./ DR PERLOV / [phone].  PATIENT WAS ABLE TO REPEAT INFORMATION BACK TO ME.

## 2016-11-21 NOTE — Telephone Encounter (Signed)
Appt has been scheduled with Leela from the referring for the pt to see Dr. Lebron Conners on 8/16 at 2pm. She will notify the pt. Letter mailed.

## 2016-11-28 ENCOUNTER — Ambulatory Visit (HOSPITAL_BASED_OUTPATIENT_CLINIC_OR_DEPARTMENT_OTHER): Payer: Medicaid Other

## 2016-11-28 ENCOUNTER — Encounter (INDEPENDENT_AMBULATORY_CARE_PROVIDER_SITE_OTHER): Payer: Self-pay

## 2016-11-28 ENCOUNTER — Telehealth: Payer: Self-pay | Admitting: Hematology and Oncology

## 2016-11-28 ENCOUNTER — Ambulatory Visit (HOSPITAL_BASED_OUTPATIENT_CLINIC_OR_DEPARTMENT_OTHER): Payer: Medicaid Other | Admitting: Hematology and Oncology

## 2016-11-28 ENCOUNTER — Encounter: Payer: Self-pay | Admitting: Hematology and Oncology

## 2016-11-28 VITALS — BP 166/75 | HR 60 | Temp 99.2°F | Resp 18 | Ht 65.0 in | Wt 270.6 lb

## 2016-11-28 DIAGNOSIS — R040 Epistaxis: Secondary | ICD-10-CM

## 2016-11-28 DIAGNOSIS — D72829 Elevated white blood cell count, unspecified: Secondary | ICD-10-CM

## 2016-11-28 DIAGNOSIS — D500 Iron deficiency anemia secondary to blood loss (chronic): Secondary | ICD-10-CM

## 2016-11-28 LAB — COMPREHENSIVE METABOLIC PANEL
ALBUMIN: 3.6 g/dL (ref 3.5–5.0)
ALK PHOS: 112 U/L (ref 40–150)
ALT: 15 U/L (ref 0–55)
ANION GAP: 10 meq/L (ref 3–11)
AST: 21 U/L (ref 5–34)
BILIRUBIN TOTAL: 0.46 mg/dL (ref 0.20–1.20)
BUN: 6.5 mg/dL — ABNORMAL LOW (ref 7.0–26.0)
CO2: 26 meq/L (ref 22–29)
Calcium: 9.6 mg/dL (ref 8.4–10.4)
Chloride: 106 mEq/L (ref 98–109)
Creatinine: 0.8 mg/dL (ref 0.6–1.1)
Glucose: 104 mg/dl (ref 70–140)
POTASSIUM: 3.7 meq/L (ref 3.5–5.1)
SODIUM: 143 meq/L (ref 136–145)
Total Protein: 7.6 g/dL (ref 6.4–8.3)

## 2016-11-28 LAB — CBC & DIFF AND RETIC
BASO%: 0.5 % (ref 0.0–2.0)
Basophils Absolute: 0.1 10*3/uL (ref 0.0–0.1)
EOS ABS: 0.1 10*3/uL (ref 0.0–0.5)
EOS%: 1 % (ref 0.0–7.0)
HEMATOCRIT: 31.7 % — AB (ref 34.8–46.6)
HEMOGLOBIN: 9.7 g/dL — AB (ref 11.6–15.9)
IMMATURE RETIC FRACT: 13.6 % — AB (ref 1.60–10.00)
LYMPH%: 17.6 % (ref 14.0–49.7)
MCH: 23.9 pg — ABNORMAL LOW (ref 25.1–34.0)
MCHC: 30.7 g/dL — ABNORMAL LOW (ref 31.5–36.0)
MCV: 77.9 fL — AB (ref 79.5–101.0)
MONO#: 0.6 10*3/uL (ref 0.1–0.9)
MONO%: 3.8 % (ref 0.0–14.0)
NEUT#: 11.4 10*3/uL — ABNORMAL HIGH (ref 1.5–6.5)
NEUT%: 77.1 % — AB (ref 38.4–76.8)
PLATELETS: 367 10*3/uL (ref 145–400)
RBC: 4.07 10*6/uL (ref 3.70–5.45)
RDW: 20.1 % — ABNORMAL HIGH (ref 11.2–14.5)
Retic %: 2.4 % — ABNORMAL HIGH (ref 0.70–2.10)
Retic Ct Abs: 97.68 10*3/uL — ABNORMAL HIGH (ref 33.70–90.70)
WBC: 14.7 10*3/uL — ABNORMAL HIGH (ref 3.9–10.3)
lymph#: 2.6 10*3/uL (ref 0.9–3.3)

## 2016-11-28 LAB — LACTATE DEHYDROGENASE: LDH: 203 U/L (ref 125–245)

## 2016-11-28 NOTE — Telephone Encounter (Signed)
Scheduled appt per 8/16 los - Gave patient AVS and calender per los.  

## 2016-11-29 DIAGNOSIS — D62 Acute posthemorrhagic anemia: Secondary | ICD-10-CM | POA: Insufficient documentation

## 2016-11-29 DIAGNOSIS — R040 Epistaxis: Secondary | ICD-10-CM | POA: Insufficient documentation

## 2016-11-29 LAB — IRON AND TIBC
%SAT: 8 % — ABNORMAL LOW (ref 21–57)
Iron: 23 ug/dL — ABNORMAL LOW (ref 41–142)
TIBC: 273 ug/dL (ref 236–444)
UIBC: 250 ug/dL (ref 120–384)

## 2016-11-29 LAB — FERRITIN: Ferritin: 32 ng/ml (ref 9–269)

## 2016-11-29 NOTE — Assessment & Plan Note (Signed)
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 Referred for evaluation due to persistent leukocytosis. On review of records, leukocytosis is predominantly neutrophilic in nature. Severity of leukocytosis tensed parallel severity of the iron deficiency anemia due to recurrent bleeding. Patient also develops intermittent thrombocythemia with episodes of bleeding.  Overall, the pattern was consistent with stress leukocytosis due to gastrointestinal or nose bleeding with reactive thrombocytosis supporting the diagnosis. Patient does have significant iron deficiency and has poor compliance with iron supplementation.  The best way of addressing the current problems is to continue addressing gastrointestinal and nasal leading episodes. Patient has never been evaluated by ENT, so that will be a one of our initial assessment points.  Plan: --Labs today --Consult ENT for recurrent epistaxis --RTC 3 weeks to review findings

## 2016-11-29 NOTE — Progress Notes (Signed)
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 Referred for evaluation due to persistent leukocytosis. On review of records, leukocytosis is predominantly neutrophilic in nature. Severity of leukocytosis tensed parallel severity of the iron deficiency anemia due to recurrent bleeding. Patient also develops intermittent thrombocythemia with episodes of bleeding.  Overall, the pattern was consistent with stress leukocytosis due to gastrointestinal or nose bleeding with reactive thrombocytosis supporting the diagnosis. Patient does have significant iron deficiency and has poor compliance with iron supplementation.  The best way of addressing the current problems is to continue addressing gastrointestinal and nasal leading episodes. Patient has never been evaluated by ENT, so that will be a one of our initial assessment points.  Plan: --Labs today --Consult ENT for recurrent epistaxis --RTC 3 weeks to review findings  Voice recognition software was used and creation of this note. Despite my best effort at editing the text, some misspelling/errors may have occurred.   Orders Placed This Encounter  Procedures  . CBC & Diff and Retic    Standing Status:   Future    Number of Occurrences:   1    Standing Expiration Date:   11/28/2017  . Comprehensive metabolic panel    Standing Status:   Future    Number of Occurrences:   1    Standing Expiration Date:   11/28/2017  . Lactate dehydrogenase (LDH)    Standing Status:   Future    Number of Occurrences:   1    Standing Expiration Date:   11/28/2017  . Ferritin    Standing Status:   Future    Number of Occurrences:   1    Standing Expiration Date:   11/28/2017  . Iron and TIBC    Standing Status:   Future    Number of Occurrences:   1    Standing Expiration Date:    11/28/2017  . H. pylori antigen, stool    Standing Status:   Future    Standing Expiration Date:   11/28/2017  . Ambulatory referral to ENT    Referral Priority:   Routine    Referral Type:   Consultation    Referral Reason:   Specialty Services Required    Requested Specialty:   Otolaryngology    Number of Visits Requested:   1    All questions were answered.  . The patient knows to call the clinic with any problems, questions or concerns.  This note was electronically signed.    History of Presenting Illness Peggy House a 56 y.o. female referred to the [facility] for evaluation of leukocytosis. Leukocytosis has been persistent, chronic and dating back to at least 2009. Concurrently, patient has been having mild to severe anemia associated with recurrent epistaxis appearance on a daily basis hour. 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 Last episode of severe upper GI bleeding appears to have occurred in January 2018. Patient reports a family history of recurrent nosebleeds in her aunt great-aunt with previous history of gastric ulcers. Patient has not had any ENT assessment so far for the epistaxis. She also has not Her appointments with gastroenterology to recheck itself the gastric ulcers or to have a colonoscopy since January 2018.  At this time, she continues to have fatigue, her symptoms are dominated by depressive symptoms that she attributes  to 7 years ago. Patient has poor sleep with difficulties going to sleep due to perseveration. She has recently been Prozac, but does not appear to have significant benefit in terms of her mood yet. It appears that multiple psychosocial stressors are contributing to her depression at this time. Patient does not have very good coping mechanisms based in our conversation today. Physically, she denies any chest pain, shortness of breath, or cough at this time. Denies active abdominal discomfort,  diarrhea, or melena. Denies night sweats, easy bruisability, or swollen glands in the neck, armpits, or groin.   Oncological/hematological History: --Labs, 08/01/07: WBC 19.4, ANC ..., Hgb 6.1, MCV 64.1, MCH ..., RDW 23.6, Plt ; Fe 331, FeSat ..., TIBC 456, Ferritin 6 --Labs, 11/23/08: WBC 15.3, ANC 12.6, Hgb 11.1, MCV 82.0, MCH ..., RDW 20.3, Plt 348; Fe <10, FeSat ..., TIBC ..., Ferritin 48, Vit B12 443; LDH 133 --Labs, 07/28/09: WBC 11.0, ANC ..., Hgb 11.3, MCV 80.1, MCH ..., RDW 17.8, Plt 272;  --Labs, 09/01/12: WBC 13.3, ANC 10.3, Hgb 10.3, MCV 77.7, MCH 24.5, RDW 18.3, Plt 299; --Labs, 04/08/14: WBC 11.6, ANC 8.5, Hgb 6.2, MCV 77.6, MCH 24.3, RDW 20.5, Plt 377; Fe 65, FeSat ..., TIBC 333, Ferritin 8, Vit B12 430; LDH 160 --Labs, 04/25/14: WBC 8.8, ANC 6.6, Hgb 7.8, MCV 79.8, MCH 23.9, RDW 20.9, Plt 476;  --Labs, 06/05/15: WBC 16.5, Hgb 10.7, MCV 74.0, MCH 23.2, RDW 19.6, Plt 346;  --Labs, 05/13/16: WBC 21.9, ANC 16.3, ALC 4.5, Hgb 8.4, MCV 74.0, MCH 21.5, RDW 18.7, Plt 518; --Labs, 10/29/16: WBC 12.5, ANC 8.9, ALC 2.8, Hgb 9.4, MCV 80.0, MCH 23.7, RDW 18.3, Plt 350; Fe 27, FeSat 10%, TIBC 284;    Medical History: Past Medical History:  Diagnosis Date  . Anxiety   . Arthritis    knnes,back  . GERD (gastroesophageal reflux disease)   . History of swelling of feet   . Hyperlipidemia   . Hypertension   . Major depressive disorder, recurrent episode ([place]) 06/05/2015  . Obesity   . Snores     Surgical History: Past Surgical History:  Procedure Laterality Date  . ABDOMINAL HYSTERECTOMY    . CARPAL TUNNEL RELEASE  05/13/2011   Procedure: CARPAL TUNNEL RELEASE;  Surgeon: Nita Sells, MD;  Location: [place];  Service: Orthopedics;  Laterality: Left;  . COLONOSCOPY WITH PROPOFOL N/A 04/28/2014   Procedure: COLONOSCOPY WITH PROPOFOL;  Surgeon: Cleotis Nipper, MD;  Location: [facility] ENDOSCOPY;  Service: Endoscopy;  Laterality: N/A;  . DG TOES*L*  2/10   rt  .  DILATION AND CURETTAGE OF UTERUS    . ESOPHAGOGASTRODUODENOSCOPY N/A 04/10/2014   Procedure: ESOPHAGOGASTRODUODENOSCOPY (EGD);  Surgeon: [name], MD;  Location: [facility] ENDOSCOPY;  Service: Endoscopy;  Laterality: N/A;  . ESOPHAGOGASTRODUODENOSCOPY (EGD) WITH PROPOFOL N/A 04/27/2014   Procedure: ESOPHAGOGASTRODUODENOSCOPY (EGD) WITH PROPOFOL;  Surgeon: Cleotis Nipper, MD;  Location: [place];  Service: Endoscopy;  Laterality: N/A;  possible apc  . HOT HEMOSTASIS N/A 04/27/2014   Procedure: HOT HEMOSTASIS (ARGON PLASMA COAGULATION/BICAP);  Surgeon: Cleotis Nipper, MD;  Location: [facility] ENDOSCOPY;  Service: Endoscopy;  Laterality: N/A;  . L shoulder Surgery  2011    Family History: Family History  Problem Relation Age of Onset  . Cancer Mother        Ovarian  . Ovarian cancer Mother   . Diabetes Mother   . Kidney disease Mother   . Diabetes type II Sister   . Diabetes Sister   .  Colon cancer Maternal Grandfather   . Crohn's disease Maternal Grandfather   . Stomach cancer Maternal Grandmother   . Kidney disease Son   . Dysmenorrhea Neg Hx     Social History: Social History   Social History  . Marital status: Single    Spouse name: N/A  . Number of children: N/A  . Years of education: N/A   Occupational History  . Not on file.   Social History Main Topics  . Smoking status: Current Every Day Smoker    Packs/day: 0.50    Years: 30.00    Types: Cigarettes  . Smokeless tobacco: Former Neurosurgeon    Types: Snuff    Quit date: 40     Comment: 1/2 PPD  . Alcohol use No  . Drug use: No     Comment: last cocaine-2010  . Sexual activity: Yes    Birth control/ protection: Surgical     Comment: Hysterectomy   Other Topics Concern  . Not on file   Social History Narrative  . No narrative on file    Allergies: Allergies  Allergen Reactions  . Nsaids Other (See Comments)    Stomach bleeding episodes; Tylenol is OK  . Tomato Hives    Medications:  Current  Outpatient Prescriptions  Medication Sig Dispense Refill  . acetaminophen (TYLENOL) 325 MG tablet Take 650 mg by mouth every 6 (six) hours as needed.    [name] albuterol (PROVENTIL HFA;VENTOLIN HFA) 108 (90 Base) MCG/ACT inhaler Inhale 1-2 puffs into the lungs every 6 (six) hours as needed for wheezing or shortness of breath. 1 Inhaler 0  . ALPRAZolam (XANAX) 0.5 MG tablet Take 1 tablet (0.5 mg total) by mouth at bedtime as needed for anxiety or sleep. 30 tablet 0  . amLODipine (NORVASC) 5 MG tablet Take 1 tablet (5 mg total) by mouth daily. 30 tablet 1  . atorvastatin (LIPITOR) 80 MG tablet Take 1 tablet (80 mg total) by mouth daily. 30 tablet 11  . benzonatate (TESSALON) 100 MG capsule Take 1 capsule (100 mg total) by mouth every 8 (eight) hours. 15 capsule 0  . Ferrous Sulfate (IRON) 325 (65 Fe) MG TABS Take 1 tablet by mouth daily. 30 each 6  . FLUoxetine (PROZAC) 20 MG capsule Take 1 capsule (20 mg total) by mouth daily. 30 capsule 2  . furosemide (LASIX) 40 MG tablet Take 1 tablet (40 mg total) by mouth daily. 30 tablet 2  . Iron-FA-B Cmp-C-Biot-Probiotic (FUSION PLUS) CAPS Take 1 capsule by mouth daily. (Patient not taking: Reported on 05/06/2016) 30 capsule 0  . lisinopril (PRINIVIL,ZESTRIL) 10 MG tablet Take 1 tablet (10 mg total) by mouth daily. 30 tablet 11  . Melatonin 5 MG TABS Take 1 tablet (5 mg total) by mouth at bedtime. 30 tablet 3  . metoprolol succinate (TOPROL-XL) 100 MG 24 hr tablet Take 1 tablet (100 mg total) by mouth daily. Take with or immediately following a meal. 90 tablet 0  . pantoprazole (PROTONIX) 40 MG tablet Take 1 tablet (40 mg total) by mouth daily. 90 tablet 0   No current facility-administered medications for this visit.     Review of Systems: Review of Systems  Constitutional: Positive for fatigue. Negative for appetite change, chills, diaphoresis, fever and unexpected weight change.  HENT:  Negative.   Eyes: Negative.   Respiratory: Negative.    Cardiovascular: Negative.   Gastrointestinal: Negative.   Endocrine: Negative.   Genitourinary: Negative.    Musculoskeletal: Negative.   Skin: Negative.  Neurological: Negative.   Hematological: Negative.   Psychiatric/Behavioral: Positive for depression and sleep disturbance. Negative for confusion, decreased concentration and suicidal ideas. The patient is nervous/anxious.      PHYSICAL EXAMINATION Blood pressure (!) 166/75, pulse 60, temperature 99.2 F (37.3 C), temperature source Oral, resp. rate 18, height '5\' 5"'$  (1.651 m), weight 270 lb 9.6 oz (122.7 kg), SpO2 100 %.  ECOG PERFORMANCE STATUS: 1 - Symptomatic but completely ambulatory  Physical Exam  Constitutional: She is oriented to person, place, and time and well-developed, well-nourished, and in no distress. No distress.  HENT:  Head: Normocephalic and atraumatic.  Mouth/Throat: Oropharynx is clear and moist. No oropharyngeal exudate.  Eyes: Pupils are equal, round, and reactive to light. Conjunctivae are normal. No scleral icterus.  Neck: Normal range of motion. No tracheal deviation present. No thyromegaly present.  Cardiovascular: Normal rate and regular rhythm.  Exam reveals no gallop.   No murmur heard. Pulmonary/Chest: Effort normal and breath sounds normal. No respiratory distress. She has no wheezes. She has no rales.  Abdominal: Soft. Bowel sounds are normal. She exhibits no distension and no mass. There is no tenderness. There is no rebound and no guarding.  Musculoskeletal: Normal range of motion. She exhibits no edema, tenderness or deformity.  Lymphadenopathy:    She has no cervical adenopathy.  Neurological: She is alert and oriented to person, place, and time. She has normal reflexes. No cranial nerve deficit.  Skin: Skin is warm. No rash noted. She is not diaphoretic. No erythema. No pallor.     LABORATORY DATA: I have personally reviewed the data as listed: Appointment on 11/28/2016  Component  Date Value Ref Range Status  . WBC 11/28/2016 14.7* 3.9 - 10.3 10e3/uL Final  . NEUT# 11/28/2016 11.4* 1.5 - 6.5 10e3/uL Final  . HGB 11/28/2016 9.7* 11.6 - 15.9 g/dL Final  . HCT 11/28/2016 31.7* 34.8 - 46.6 % Final  . Platelets 11/28/2016 367  145 - 400 10e3/uL Final  . MCV 11/28/2016 77.9* 79.5 - 101.0 fL Final  . MCH 11/28/2016 23.9* 25.1 - 34.0 pg Final  . MCHC 11/28/2016 30.7* 31.5 - 36.0 g/dL Final  . RBC 11/28/2016 4.07  3.70 - 5.45 10e6/uL Final  . RDW 11/28/2016 20.1* 11.2 - 14.5 % Final  . lymph# 11/28/2016 2.6  0.9 - 3.3 10e3/uL Final  . MONO# 11/28/2016 0.6  0.1 - 0.9 10e3/uL Final  . Eosinophils Absolute 11/28/2016 0.1  0.0 - 0.5 10e3/uL Final  . Basophils Absolute 11/28/2016 0.1  0.0 - 0.1 10e3/uL Final  . NEUT% 11/28/2016 77.1* 38.4 - 76.8 % Final  . LYMPH% 11/28/2016 17.6  14.0 - 49.7 % Final  . MONO% 11/28/2016 3.8  0.0 - 14.0 % Final  . EOS% 11/28/2016 1.0  0.0 - 7.0 % Final  . BASO% 11/28/2016 0.5  0.0 - 2.0 % Final  . Retic % 11/28/2016 2.40* 0.70 - 2.10 % Final  . Retic Ct Abs 11/28/2016 97.68* 33.70 - 90.70 10e3/uL Final  . Immature Retic Fract 11/28/2016 13.60* 1.60 - 10.00 % Final  . Sodium 11/28/2016 143  136 - 145 mEq/L Final  . Potassium 11/28/2016 3.7  3.5 - 5.1 mEq/L Final  . Chloride 11/28/2016 106  98 - 109 mEq/L Final  . CO2 11/28/2016 26  22 - 29 mEq/L Final  . Glucose 11/28/2016 104  70 - 140 mg/dl Final   Glucose reference range is for nonfasting patients. Fasting glucose reference range is 70- 100.  [name] BUN 11/28/2016  6.5* 7.0 - 26.0 mg/dL Final  . Creatinine 11/28/2016 0.8  0.6 - 1.1 mg/dL Final  . Total Bilirubin 11/28/2016 0.46  0.20 - 1.20 mg/dL Final  . Alkaline Phosphatase 11/28/2016 112  40 - 150 U/L Final  . AST 11/28/2016 21  5 - 34 U/L Final  . ALT 11/28/2016 15  0 - 55 U/L Final  . Total Protein 11/28/2016 7.6  6.4 - 8.3 g/dL Final  . Albumin 11/28/2016 3.6  3.5 - 5.0 g/dL Final  . Calcium 11/28/2016 9.6  8.4 - 10.4 mg/dL Final  .  Anion Gap 11/28/2016 10  3 - 11 mEq/L Final  . EGFR 11/28/2016 >90  >90 ml/min/1.73 m2 Final   eGFR is calculated using the CKD-EPI Creatinine Equation (2009)  . LDH 11/28/2016 203  125 - 245 U/L Final  . Ferritin 11/28/2016 32  9 - 269 ng/ml Final  . Iron 11/28/2016 23* 41 - 142 ug/dL Final  . TIBC 11/28/2016 273  236 - 444 ug/dL Final  . UIBC 11/28/2016 250  120 - 384 ug/dL Final  . %SAT 11/28/2016 8* 21 - 57 % Final         Ardath Sax, MD

## 2016-12-03 ENCOUNTER — Ambulatory Visit (INDEPENDENT_AMBULATORY_CARE_PROVIDER_SITE_OTHER): Payer: Medicaid Other | Admitting: Internal Medicine

## 2016-12-03 VITALS — BP 172/79 | HR 73 | Temp 98.2°F | Ht 65.0 in | Wt 269.6 lb

## 2016-12-03 DIAGNOSIS — I10 Essential (primary) hypertension: Secondary | ICD-10-CM | POA: Diagnosis not present

## 2016-12-03 DIAGNOSIS — F331 Major depressive disorder, recurrent, moderate: Secondary | ICD-10-CM

## 2016-12-03 DIAGNOSIS — D72829 Elevated white blood cell count, unspecified: Secondary | ICD-10-CM

## 2016-12-03 DIAGNOSIS — F5101 Primary insomnia: Secondary | ICD-10-CM | POA: Diagnosis not present

## 2016-12-03 DIAGNOSIS — Z6841 Body Mass Index (BMI) 40.0 and over, adult: Secondary | ICD-10-CM | POA: Diagnosis not present

## 2016-12-03 DIAGNOSIS — Z79899 Other long term (current) drug therapy: Secondary | ICD-10-CM | POA: Diagnosis not present

## 2016-12-03 DIAGNOSIS — R040 Epistaxis: Secondary | ICD-10-CM

## 2016-12-03 MED ORDER — FLUOXETINE HCL 20 MG PO CAPS: 40.0000 mg | ORAL_CAPSULE | Freq: Every day | ORAL | 2 refills | Status: DC

## 2016-12-03 MED ORDER — MELATONIN 10 MG PO CAPS: 10.0000 mg | ORAL_CAPSULE | Freq: Every day | ORAL | 2 refills | Status: DC

## 2016-12-03 NOTE — Progress Notes (Signed)
   CC: follow-up of HTN, Depression  HPI:  [name] is a 56 y.o. F with medical history as outlined below who presents for follow up of her chronic medical conditions.   HTN: Didn't take BP medications at last visit in July 2/2 depressive symptoms. She is prescribed Lisinopril 10 mg, Toprol Xl 100ng and  Amlodipine 5mg  Rx (started at last visit). Out for 3 days and complains of headache. No changes in vision. No weakness.   Insomnia, Fatigue: Rx for melatonin 5mg  last month. Worked for a few days however now not much improvement.   Depression: Started on prozac 20mg  QHS 10/2016. PHQ-9 at that time 22. She had been. PHQ-9 today 17. Feels better but still down.  HCM: Needs HepC, HIV screens. Been referred for mamo several times recently  Persistent leukocytosis, epistaxis: Seen by hematology who feel lab derangements secondary to chronic epistaxis, reactive thrombocytosis and stress leukocytosis. Patient referred to ENT and to follow-up in 3 weeks.   Past Medical History:  Diagnosis Date  . Anxiety   . Arthritis    knnes,back  . GERD (gastroesophageal reflux disease)   . History of swelling of feet   . Hyperlipidemia   . Hypertension   . Major depressive disorder, recurrent episode ([place]) 06/05/2015  . Obesity   . Snores    Review of Systems:   General: Denies fevers, chills, weight loss, fatigue HEENT: Denies changes in vision, sore throat, dysphagia Cardiac: Denies CP, SOB, palpitations Pulmonary: Denies cough, wheezes, PND Abd: Denies diarrhea, constipation, changes in bowels Extremities: Denies weakness or swelling  Physical Exam: General: Alert, in no acute distress. Pleasant and conversant HEENT: + epistaxis. No icterus, injection or ptosis. No hoarseness or dysarthria  Cardiac: RRR, no MGR appreciated Pulmonary: CTA BL with normal WOB on RA. Able to speak in complete sentences Abd: Soft, non-tended. +bs Extremities: Warm, perfused. No significant pedal edema.    Vitals:   12/03/16 1521  BP: (!) 172/79  Pulse: 73  Temp: 98.2 F (36.8 C)  TempSrc: Oral  SpO2: 100%  Weight: 269 lb 9.6 oz (122.3 kg)  Height: 5\' 5"  (1.651 m)    Assessment & Plan:   See Encounters Tab for problem based charting.  Patient discussed with Dr. Daryll Drown

## 2016-12-03 NOTE — Patient Instructions (Addendum)
It was great seeing you today! I'm glad youre doing better.   Today we talked about your blood pressure. It was VERY HIGH today. This was because you have been out of your blood pressure medications for the past few days. Please go to the pharmacy on your way home and pick up your medications :)  Today we also talked about your depression. I'm glad youre feeling better since starting Prozac. We are going to increase this to 40 mg daily. Please take this before bed.   I'm sorry youre still having trouble sleeping. I am increasing your Melatonin to 10 mg before bed. If this does not work, you can take benadryl as needed.   I'm getting a lab today to check your thyroid.   Please come back and see [place] in clinic in 1 month to recheck your blood pressure! Please make sure to take your medications the day of your appointment.

## 2016-12-04 LAB — TSH: TSH: 2.18 u[IU]/mL (ref 0.450–4.500)

## 2016-12-04 NOTE — Assessment & Plan Note (Signed)
Hypertensive today. She has been taking her toprol xl 100mg , lisinopril 10 mg and amlodipine 5mg  consistently however has been out of these meds for 3 days. Complains of headache. No changes in vision and no neuro deficits. Counseled extensively about the importance of medication compliance. She agreed to pick up these prescriptions from the pharmacy on her way home from this appointment.  -Ensured plenty of refills -Pt to take all medications when she gets home -RTC 1 month to ensure BP controlled. Strongly encouraged to take BP meds prior to visit

## 2016-12-04 NOTE — Assessment & Plan Note (Signed)
Melatonin 5mg  worked for a few days. Will increase to 10 mg. Discussed appropriate sleep hygiene as well.

## 2016-12-04 NOTE — Assessment & Plan Note (Addendum)
Pt has hx of abnormal TSH in the past however doesn't appear to have received treatment. She does have fatigue, weight gain and constipation. Will check TSH to make sure an underactive thyroid is not contributing to her current clinical picture.  -Check TSH  -Continued to encourage diet and exercise

## 2016-12-04 NOTE — Assessment & Plan Note (Addendum)
Mood improved since starting Prozac 20 mg at last visit. PHQ-9 today 17, was 22 when starting. Still feels down. Still has not yet been to church which is one of her favorite activities. Encouraged.  -Increase prozac to 40 mg daily -Follow up 1 month

## 2016-12-04 NOTE — Assessment & Plan Note (Signed)
Currently being seen by hematology. Feels this is the source of patients leukocytosis and thrombocytosis.  Has follow up in 2 weeks.

## 2016-12-05 NOTE — Progress Notes (Signed)
Internal Medicine Clinic Attending  Case discussed with Dr. Molt at the time of the visit.  We reviewed the resident's history and exam and pertinent patient test results.  I agree with the assessment, diagnosis, and plan of care documented in the resident's note. 

## 2016-12-13 ENCOUNTER — Telehealth: Payer: Self-pay | Admitting: Hematology and Oncology

## 2016-12-13 NOTE — Telephone Encounter (Signed)
Called patient regarding 9/11 appointment

## 2016-12-18 ENCOUNTER — Ambulatory Visit: Payer: Medicaid Other | Admitting: Hematology and Oncology

## 2016-12-24 ENCOUNTER — Telehealth: Payer: Self-pay | Admitting: Hematology and Oncology

## 2016-12-24 ENCOUNTER — Ambulatory Visit: Payer: Self-pay | Admitting: Hematology and Oncology

## 2016-12-24 NOTE — Telephone Encounter (Signed)
Patient rescheduled 9/11 appointment.

## 2016-12-25 ENCOUNTER — Telehealth: Payer: Self-pay | Admitting: Hematology and Oncology

## 2016-12-25 ENCOUNTER — Ambulatory Visit (HOSPITAL_BASED_OUTPATIENT_CLINIC_OR_DEPARTMENT_OTHER): Payer: Medicaid Other | Admitting: Hematology and Oncology

## 2016-12-25 ENCOUNTER — Encounter: Payer: Self-pay | Admitting: Hematology and Oncology

## 2016-12-25 VITALS — BP 157/78 | HR 61 | Temp 98.1°F | Resp 20 | Ht 65.0 in | Wt 268.2 lb

## 2016-12-25 DIAGNOSIS — R040 Epistaxis: Secondary | ICD-10-CM

## 2016-12-25 DIAGNOSIS — D473 Essential (hemorrhagic) thrombocythemia: Secondary | ICD-10-CM

## 2016-12-25 DIAGNOSIS — Q2733 Arteriovenous malformation of digestive system vessel: Secondary | ICD-10-CM | POA: Diagnosis not present

## 2016-12-25 DIAGNOSIS — K259 Gastric ulcer, unspecified as acute or chronic, without hemorrhage or perforation: Secondary | ICD-10-CM | POA: Diagnosis not present

## 2016-12-25 DIAGNOSIS — D72829 Elevated white blood cell count, unspecified: Secondary | ICD-10-CM

## 2016-12-25 DIAGNOSIS — D500 Iron deficiency anemia secondary to blood loss (chronic): Secondary | ICD-10-CM | POA: Diagnosis not present

## 2016-12-25 MED ORDER — FERROUS GLUCONATE 324 (38 FE) MG PO TABS: 324.0000 mg | ORAL_TABLET | Freq: Three times a day (TID) | ORAL | 0 refills | Status: DC

## 2016-12-25 NOTE — Telephone Encounter (Signed)
Gave patient AVS and calendar of upcoming October appointments °

## 2016-12-27 NOTE — Progress Notes (Signed)
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 Referred for evaluation due to persistent leukocytosis. On review of records, leukocytosis is predominantly neutrophilic in nature. Severity of leukocytosis tends to parallel severity of the iron deficiency anemia due to recurrent bleeding. Patient also develops intermittent thrombocythemia with episodes of bleeding.  Overall, the pattern was consistent with stress leukocytosis due to gastrointestinal or nose bleeding with reactive thrombocytosis supporting the diagnosis. Patient has been shown to have significant iron deficiency and has poor compliance with iron supplementation. Following our last visit to the clinic, elevated, patient did not take the medication after developing constipation from. Nor did she inform our clinic to allow [place] to switch the supplement.  Plan: --Change from ferrous sulfate to ferrous gluconate --RTC 4 weeks to review the labwork  Voice recognition software was used and creation of this note. Despite my best effort at editing the text, some misspelling/errors may have occurred.  Orders Placed This Encounter  Procedures  . CBC & Diff and Retic    Standing Status:   Future    Standing Expiration Date:   12/25/2017  . Comprehensive metabolic panel    Standing Status:   Future    Standing Expiration Date:   12/25/2017  . Ferritin    Standing Status:   Future    Standing Expiration Date:   12/25/2017  . Iron and TIBC    Standing Status:   Future    Standing Expiration Date:   12/25/2017    All questions were answered.  . The patient knows to call the clinic with any problems, questions or concerns.  This note was electronically signed.    History of Presenting Illness Peggy House is a 56 y.o. African-American female followed in the  [facility] for leukocytosis and anemia. Leukocytosis has been persistent, chronic and dating back to at least 2009. Concurrently, patient has been having mild to severe anemia associated with recurrent epistaxis appearance on a daily basis hour. 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 Last episode of severe upper GI bleeding appears to have occurred in January 2018. Patient reports a family history of recurrent nosebleeds in her aunt great-aunt with previous history of gastric ulcers. Patient has not had any ENT assessment so far for the epistaxis. She also has not had her appointments with gastroenterology to recheck itself the gastric ulcers or to have a colonoscopy since Jan 2018.  At the time of initial presentation, she continued to have fatigue, her symptoms were dominated by depressive symptoms that she attributes to Loss of her mother 7 years prior. Patient was complaining of poor sleep with difficulties going to sleep due to perseveration. She has previously tried therapy with Prozac, but did not appear to have significant benefit in terms of her mood at the time of last visit. It appeared that multiple psychosocial stressors are contributing to her depression at this time. Patient did not seem to have very good coping mechanisms based in our conversation at that time. Physically, she denied any chest pain, shortness of breath, or cough at that time. Denied active abdominal discomfort, diarrhea, or melena. Denied night sweats, easy bruisability, or swollen glands in the neck, armpits, or groin.  At last visit to the clinic, patient on ferrous sulfate. It appears that the medication) significant constipation with her being  able to only have to bowel movements per week and patient has self discontinued medication without contacting our clinic. Patient did not recheck her labs despite being instructed to so at last visit to the  clinic.   Oncological/hematological History: --Labs, 08/01/07: WBC 19.4, ANC ..., Hgb 6.1, MCV 64.1, MCH ..., RDW 23.6, Plt ; Fe 331, FeSat ..., TIBC 456, Ferritin 6 --Labs, 11/23/08: WBC 15.3, ANC 12.6, Hgb 11.1, MCV 82.0, MCH ..., RDW 20.3, Plt 348; Fe <10, FeSat ..., TIBC ..., Ferritin 48, Vit B12 443; LDH 133 --Labs, 07/28/09: WBC 11.0, ANC ..., Hgb 11.3, MCV 80.1, MCH ..., RDW 17.8, Plt 272;  --Labs, 09/01/12: WBC 13.3, ANC 10.3, Hgb 10.3, MCV 77.7, MCH 24.5, RDW 18.3, Plt 299; --Labs, 04/08/14: WBC 11.6, ANC 8.5, Hgb 6.2, MCV 77.6, MCH 24.3, RDW 20.5, Plt 377; Fe 65, FeSat ..., TIBC 333, Ferritin 8, Vit B12 430; LDH 160 --Labs, 04/25/14: WBC 8.8, ANC 6.6, Hgb 7.8, MCV 79.8, MCH 23.9, RDW 20.9, Plt 476;  --Labs, 06/05/15: WBC 16.5, Hgb 10.7, MCV 74.0, MCH 23.2, RDW 19.6, Plt 346;  --Labs, 05/13/16: WBC 21.9, ANC 16.3, ALC 4.5, Hgb 8.4, MCV 74.0, MCH 21.5, RDW 18.7, Plt 518; --Labs, 10/29/16: WBC 12.5, ANC 8.9, ALC 2.8, Hgb 9.4, MCV 80.0, MCH 23.7, RDW 18.3, Plt 350; Fe 27, FeSat 10%, TIBC 284;  --Labs, 11/28/16: WBC 14.7, Hgb 9.7, MCV 77.9, MCH 23.9, RDW 20.1, Plt 367; Fe 23, FeSat 8%, TIBC 273, Ferritin 32;   Medical History: Past Medical History:  Diagnosis Date  . Anxiety   . Arthritis    knnes,back  . GERD (gastroesophageal reflux disease)   . History of swelling of feet   . Hyperlipidemia   . Hypertension   . Major depressive disorder, recurrent episode ([place]) 06/05/2015  . Obesity   . Snores     Surgical History: Past Surgical History:  Procedure Laterality Date  . ABDOMINAL HYSTERECTOMY    . CARPAL TUNNEL RELEASE  05/13/2011   Procedure: CARPAL TUNNEL RELEASE;  Surgeon: Nita Sells, MD;  Location: [facility];  Service: Orthopedics;  Laterality: Left;  . COLONOSCOPY WITH PROPOFOL N/A 04/28/2014   Procedure: COLONOSCOPY WITH PROPOFOL;  Surgeon: Cleotis Nipper, MD;  Location: [facility] ENDOSCOPY;  Service: Endoscopy;  Laterality: N/A;  . DG TOES*L*   2/10   rt  . DILATION AND CURETTAGE OF UTERUS    . ESOPHAGOGASTRODUODENOSCOPY N/A 04/10/2014   Procedure: ESOPHAGOGASTRODUODENOSCOPY (EGD);  Surgeon: [name], MD;  Location[facility] END[facility]COPY;  Service: Endoscopy;  Laterality: N/A;  . ESOPHAGOGASTRODUODENOSCOPY (EGD) WITH PROPOFOL N/A 04/27/2014   Procedure: ESOPHAGOGASTRODUODENOSCOPY (EGD) WITH PROPOFOL;  Surgeon: Cleotis Nipper, MD;  Location: [place];  Service: Endoscopy;  Laterality: N/A;  possible apc  . HOT HEMOSTASIS N/A 04/27/2014   Procedure: HOT HEMOSTASIS (ARGON PLASMA COAGULATION/BICAP);  Surgeon: Cleotis Nipper, MD;  Location: [facility] ENDOSCOPY;  Service: Endoscopy;  Laterality: N/A;  . L shoulder Surgery  2011    Family History: Family History  Problem Relation Age of Onset  . Cancer Mother        Ovarian  . Ovarian cancer Mother   . Diabetes Mother   . Kidney disease Mother   . Diabetes type II Sister   . Diabetes Sister   . Colon cancer Maternal Grandfather   . Crohn's disease Maternal Grandfather   . Stomach cancer Maternal Grandmother   . Kidney disease Son   . Dysmenorrhea Neg Hx     Social History:  Social History   Social History  . Marital status: Single    Spouse name: N/A  . Number of children: N/A  . Years of education: N/A   Occupational History  . Not on file.   Social History Main Topics  . Smoking status: Current Every Day Smoker    Packs/day: 0.50    Years: 30.00    Types: Cigarettes  . Smokeless tobacco: Former Systems developer    Types: Snuff    Quit date: 1     Comment: 1/2 PPD  . Alcohol use No  . Drug use: No     Comment: last cocaine-2010  . Sexual activity: Yes    Birth control/ protection: Surgical     Comment: Hysterectomy   Other Topics Concern  . Not on file   Social History Narrative  . No narrative on file    Allergies: Allergies  Allergen Reactions  . Nsaids Other (See Comments)    Stomach bleeding episodes; Tylenol is OK  . Tomato Hives    Medications:   Current Outpatient Prescriptions  Medication Sig Dispense Refill  . acetaminophen (TYLENOL) 325 MG tablet Take 650 mg by mouth every 6 (six) hours as needed.    [name] albuterol (PROVENTIL HFA;VENTOLIN HFA) 108 (90 Base) MCG/ACT inhaler Inhale 1-2 puffs into the lungs every 6 (six) hours as needed for wheezing or shortness of breath. 1 Inhaler 0  . ALPRAZolam (XANAX) 0.5 MG tablet Take 1 tablet (0.5 mg total) by mouth at bedtime as needed for anxiety or sleep. 30 tablet 0  . amLODipine (NORVASC) 5 MG tablet Take 1 tablet (5 mg total) by mouth daily. 30 tablet 1  . atorvastatin (LIPITOR) 80 MG tablet Take 1 tablet (80 mg total) by mouth daily. 30 tablet 11  . benzonatate (TESSALON) 100 MG capsule Take 1 capsule (100 mg total) by mouth every 8 (eight) hours. (Patient not taking: Reported on 12/26/2016) 15 capsule 0  . ferrous gluconate (FERGON) 324 MG tablet Take 1 tablet (324 mg total) by mouth 3 (three) times daily with meals. 90 tablet 0  . Ferrous Sulfate (IRON) 325 (65 Fe) MG TABS Take 1 tablet by mouth daily. 30 each 6  . FLUoxetine (PROZAC) 20 MG capsule Take 2 capsules (40 mg total) by mouth daily. 30 capsule 2  . furosemide (LASIX) 40 MG tablet Take 1 tablet (40 mg total) by mouth daily. 30 tablet 2  . lisinopril (PRINIVIL,ZESTRIL) 10 MG tablet Take 1 tablet (10 mg total) by mouth daily. 30 tablet 11  . Melatonin 10 MG CAPS Take 10 mg by mouth at bedtime. 30 capsule 2  . metoprolol succinate (TOPROL-XL) 100 MG 24 hr tablet Take 1 tablet (100 mg total) by mouth daily. Take with or immediately following a meal. 90 tablet 0  . pantoprazole (PROTONIX) 40 MG tablet Take 1 tablet (40 mg total) by mouth daily. 90 tablet 0   No current facility-administered medications for this visit.     Review of Systems: Review of Systems  Constitutional: Positive for fatigue. Negative for appetite change, chills, diaphoresis, fever and unexpected weight change.  HENT:  Negative.   Eyes: Negative.    Respiratory: Negative.   Cardiovascular: Negative.   Gastrointestinal: Negative.   Endocrine: Negative.   Genitourinary: Negative.    Musculoskeletal: Negative.   Skin: Negative.   Neurological: Negative.   Hematological: Negative.   Psychiatric/Behavioral: Positive for depression and sleep disturbance. Negative for confusion, decreased concentration and suicidal ideas. The patient is nervous/anxious.  PHYSICAL EXAMINATION Blood pressure (!) 157/78, pulse 61, temperature 98.1 F (36.7 C), temperature source Oral, resp. rate 20, height 5\' 5"  (1.651 m), weight 268 lb 3.2 oz (121.7 kg), SpO2 99 %.  ECOG PERFORMANCE STATUS: 1 - Symptomatic but completely ambulatory  Physical Exam  Constitutional: She is oriented to person, place, and time and well-developed, well-nourished, and in no distress. No distress.  HENT:  Head: Normocephalic and atraumatic.  Mouth/Throat: Oropharynx is clear and moist. No oropharyngeal exudate.  Eyes: Pupils are equal, round, and reactive to light. Conjunctivae are normal. No scleral icterus.  Neck: Normal range of motion. No tracheal deviation present. No thyromegaly present.  Cardiovascular: Normal rate and regular rhythm.  Exam reveals no gallop.   No murmur heard. Pulmonary/Chest: Effort normal and breath sounds normal. No respiratory distress. She has no wheezes. She has no rales.  Abdominal: Soft. Bowel sounds are normal. She exhibits no distension and no mass. There is no tenderness. There is no rebound and no guarding.  Musculoskeletal: Normal range of motion. She exhibits no edema, tenderness or deformity.  Lymphadenopathy:    She has no cervical adenopathy.  Neurological: She is alert and oriented to person, place, and time. She has normal reflexes. No cranial nerve deficit.  Skin: Skin is warm. No rash noted. She is not diaphoretic. No erythema. No pallor.     LABORATORY DATA: I have personally reviewed the data as listed: No visits with  results within 1 Week(s) from this visit.  Latest known visit with results is:  Office Visit on 12/03/2016  Component Date Value Ref Range Status  . TSH 12/03/2016 2.180  0.450 - 4.500 uIU/mL Final       Ardath Sax, MD

## 2016-12-27 NOTE — Assessment & Plan Note (Signed)
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 Referred for evaluation due to persistent leukocytosis. On review of records, leukocytosis is predominantly neutrophilic in nature. Severity of leukocytosis tends to parallel severity of the iron deficiency anemia due to recurrent bleeding. Patient also develops intermittent thrombocythemia with episodes of bleeding.  Overall, the pattern was consistent with stress leukocytosis due to gastrointestinal or nose bleeding with reactive thrombocytosis supporting the diagnosis. Patient has been shown to have significant iron deficiency and has poor compliance with iron supplementation. Following our last visit to the clinic, elevated, patient did not take the medication after developing constipation from. Nor did she inform our clinic to allow [place] to switch the supplement.  Plan: --Change from ferrous sulfate to ferrous gluconate --RTC 4 weeks to review the labwork

## 2017-01-07 ENCOUNTER — Encounter: Payer: Medicaid Other | Admitting: Internal Medicine

## 2017-01-08 ENCOUNTER — Other Ambulatory Visit: Payer: Self-pay | Admitting: Internal Medicine

## 2017-01-09 ENCOUNTER — Telehealth: Payer: Self-pay | Admitting: Internal Medicine

## 2017-01-09 ENCOUNTER — Other Ambulatory Visit: Payer: Self-pay | Admitting: *Deleted

## 2017-01-09 NOTE — Telephone Encounter (Signed)
Pls call pt out of medicine

## 2017-01-10 NOTE — Telephone Encounter (Signed)
Spoke w/ pt, request sent to md

## 2017-01-11 MED ORDER — METOPROLOL SUCCINATE ER 100 MG PO TB24: 100.0000 mg | ORAL_TABLET | Freq: Every day | ORAL | 0 refills | Status: DC

## 2017-01-11 MED ORDER — AMLODIPINE BESYLATE 5 MG PO TABS: 5.0000 mg | ORAL_TABLET | Freq: Every day | ORAL | 0 refills | Status: DC

## 2017-01-17 ENCOUNTER — Telehealth: Payer: Self-pay | Admitting: Hematology

## 2017-01-17 NOTE — Telephone Encounter (Signed)
Moved patient f/u from MP to YF due to insurance. Per YF moved lab/fu from week of 10/10 to week of 10/29. Spoke with patient re new appointments for 10/24 and 10/29 and seeing YF 10/29.

## 2017-01-20 ENCOUNTER — Other Ambulatory Visit: Payer: Medicaid Other

## 2017-01-22 ENCOUNTER — Ambulatory Visit: Payer: Medicaid Other | Admitting: Hematology and Oncology

## 2017-02-04 ENCOUNTER — Other Ambulatory Visit: Payer: Self-pay | Admitting: Internal Medicine

## 2017-02-04 MED ORDER — ALPRAZOLAM 0.5 MG PO TABS: 0.5000 mg | ORAL_TABLET | Freq: Every evening | ORAL | 0 refills | Status: DC | PRN

## 2017-02-04 MED ORDER — FLUOXETINE HCL 20 MG PO CAPS: 40.0000 mg | ORAL_CAPSULE | Freq: Every day | ORAL | 0 refills | Status: DC

## 2017-02-04 MED ORDER — PANTOPRAZOLE SODIUM 40 MG PO TBEC: 40.0000 mg | DELAYED_RELEASE_TABLET | Freq: Every day | ORAL | 0 refills | Status: DC

## 2017-02-04 NOTE — Telephone Encounter (Signed)
NEEDS REFILL, FLUOXETINE 20MG , PANTOPRAZOLE 40MG , ALPRAZOLAM 0.5MG , TO [facility] [id number]

## 2017-02-04 NOTE — Telephone Encounter (Signed)
Refills approved. Would you mind double checking to make sure the Aprazolam gets called in? Thanks so much!

## 2017-02-05 ENCOUNTER — Other Ambulatory Visit (HOSPITAL_BASED_OUTPATIENT_CLINIC_OR_DEPARTMENT_OTHER): Payer: Medicaid Other

## 2017-02-05 DIAGNOSIS — D500 Iron deficiency anemia secondary to blood loss (chronic): Secondary | ICD-10-CM

## 2017-02-05 DIAGNOSIS — R040 Epistaxis: Secondary | ICD-10-CM | POA: Diagnosis not present

## 2017-02-05 DIAGNOSIS — D72829 Elevated white blood cell count, unspecified: Secondary | ICD-10-CM

## 2017-02-05 LAB — IRON AND TIBC
%SAT: 7 % — ABNORMAL LOW (ref 21–57)
Iron: 19 ug/dL — ABNORMAL LOW (ref 41–142)
TIBC: 284 ug/dL (ref 236–444)
UIBC: 265 ug/dL (ref 120–384)

## 2017-02-05 LAB — COMPREHENSIVE METABOLIC PANEL
ALBUMIN: 3.5 g/dL (ref 3.5–5.0)
ALT: 8 U/L (ref 0–55)
AST: 13 U/L (ref 5–34)
Alkaline Phosphatase: 84 U/L (ref 40–150)
Anion Gap: 9 mEq/L (ref 3–11)
BILIRUBIN TOTAL: 0.23 mg/dL (ref 0.20–1.20)
BUN: 9.3 mg/dL (ref 7.0–26.0)
CALCIUM: 8.9 mg/dL (ref 8.4–10.4)
CO2: 24 mEq/L (ref 22–29)
CREATININE: 0.9 mg/dL (ref 0.6–1.1)
Chloride: 107 mEq/L (ref 98–109)
EGFR: >60 mL/min/{1.73_m2} (ref >60)
GLUCOSE: 134 mg/dL (ref 70–140)
POTASSIUM: 3.5 meq/L (ref 3.5–5.1)
SODIUM: 141 meq/L (ref 136–145)
TOTAL PROTEIN: 7.1 g/dL (ref 6.4–8.3)

## 2017-02-05 LAB — CBC & DIFF AND RETIC
BASO%: 0.8 % (ref 0.0–2.0)
BASOS ABS: 0.1 10*3/uL (ref 0.0–0.1)
EOS ABS: 0.1 10*3/uL (ref 0.0–0.5)
EOS%: 0.8 % (ref 0.0–7.0)
HEMATOCRIT: 26.6 % — AB (ref 34.8–46.6)
HEMOGLOBIN: 8.1 g/dL — AB (ref 11.6–15.9)
IMMATURE RETIC FRACT: 16.8 % — AB (ref 1.60–10.00)
LYMPH#: 2.6 10*3/uL (ref 0.9–3.3)
LYMPH%: 19 % (ref 14.0–49.7)
MCH: 25.4 pg (ref 25.1–34.0)
MCHC: 30.7 g/dL — ABNORMAL LOW (ref 31.5–36.0)
MCV: 82.8 fL (ref 79.5–101.0)
MONO#: 0.5 10*3/uL (ref 0.1–0.9)
MONO%: 3.4 % (ref 0.0–14.0)
NEUT#: 10.3 10*3/uL — ABNORMAL HIGH (ref 1.5–6.5)
NEUT%: 76 % (ref 38.4–76.8)
Platelets: 346 10*3/uL (ref 145–400)
RBC: 3.21 10*6/uL — ABNORMAL LOW (ref 3.70–5.45)
RDW: 22.6 % — ABNORMAL HIGH (ref 11.2–14.5)
RETIC %: 4.09 % — AB (ref 0.70–2.10)
Retic Ct Abs: 131.29 10*3/uL — ABNORMAL HIGH (ref 33.70–90.70)
WBC: 13.5 10*3/uL — ABNORMAL HIGH (ref 3.9–10.3)
nRBC: 0 % (ref 0–0)

## 2017-02-05 LAB — FERRITIN: Ferritin: 18 ng/ml (ref 9–269)

## 2017-02-05 NOTE — Telephone Encounter (Signed)
Called to pharm 

## 2017-02-10 ENCOUNTER — Ambulatory Visit: Payer: Medicaid Other | Admitting: Hematology

## 2017-02-12 ENCOUNTER — Telehealth: Payer: Self-pay | Admitting: Hematology

## 2017-02-12 NOTE — Telephone Encounter (Signed)
Spoke with patient and rescheduled appt per 10/29 sch msg.

## 2017-02-17 ENCOUNTER — Ambulatory Visit: Payer: Medicaid Other | Admitting: Hematology

## 2017-02-19 ENCOUNTER — Ambulatory Visit: Payer: Medicaid Other | Admitting: Hematology

## 2017-02-20 ENCOUNTER — Telehealth: Payer: Self-pay | Admitting: Hematology

## 2017-02-20 NOTE — Telephone Encounter (Signed)
Called patient regarding add on appointment

## 2017-02-25 ENCOUNTER — Ambulatory Visit: Payer: Medicaid Other | Admitting: Internal Medicine

## 2017-02-25 DIAGNOSIS — F331 Major depressive disorder, recurrent, moderate: Secondary | ICD-10-CM

## 2017-02-25 DIAGNOSIS — F17210 Nicotine dependence, cigarettes, uncomplicated: Secondary | ICD-10-CM

## 2017-02-25 DIAGNOSIS — F339 Major depressive disorder, recurrent, unspecified: Secondary | ICD-10-CM

## 2017-02-25 DIAGNOSIS — I10 Essential (primary) hypertension: Secondary | ICD-10-CM | POA: Diagnosis not present

## 2017-02-25 DIAGNOSIS — Z79899 Other long term (current) drug therapy: Secondary | ICD-10-CM

## 2017-02-25 DIAGNOSIS — Z0000 Encounter for general adult medical examination without abnormal findings: Secondary | ICD-10-CM

## 2017-02-25 MED ORDER — MELATONIN 10 MG PO CAPS: 10.0000 mg | ORAL_CAPSULE | Freq: Every day | ORAL | 2 refills | Status: DC

## 2017-02-25 MED ORDER — LISINOPRIL 10 MG PO TABS: 10.0000 mg | ORAL_TABLET | Freq: Every day | ORAL | 11 refills | Status: DC

## 2017-02-25 MED ORDER — TRAZODONE HCL 50 MG PO TABS: 50.0000 mg | ORAL_TABLET | Freq: Every day | ORAL | 1 refills | Status: DC

## 2017-02-25 MED ORDER — METOPROLOL SUCCINATE ER 100 MG PO TB24: 100.0000 mg | ORAL_TABLET | Freq: Every day | ORAL | 0 refills | Status: DC

## 2017-02-25 NOTE — Patient Instructions (Signed)
It was a pleasure seeing you again today! I'm sorry you are still not feeling well.   Today we talked about your depression. I'm glad the Prozac helped a little. Instead of adding 2 new medications today we are instead going to add 1 medication to your current regimen. Please continue taking Prozac 40 mg daily however please take Trazodone 50mg  at night. This medicine will help you sleep and also your depression and anxiety as well.   Id like to see you back in 1 month to make sure Trazodone is helping!

## 2017-02-25 NOTE — Progress Notes (Signed)
   CC: follow-up of HTN, depression, HCM  HPI:  [name] is a 56 y.o. F here for follow-up of HTN, depression and HCM.  Depression: Started on Prozac 20mg  10/2016 at which time PHQ-9 22. Was on Celexa prior to this without improvement. Seen for follow-up 8/18 and noted subjective improvement however still felt down. PHQ-9 at that visit 17. Prozac increased to 40mg . Today reports an initial improvement with prozac 40mg  however now feels quite depressed and unmotivated. No thoughts of SI/HI. Denies any particular stressor.   HTN: Was hypertensive at last visit although was out of medications x3 days. On Toprol Xl 100mg , lisinopril 10mg  and amlodipine 5mg .   HCM: Needs Mammogram, HepC screen, HIV screen.   Past Medical History:  Diagnosis Date  . Anxiety   . Arthritis    knnes,back  . GERD (gastroesophageal reflux disease)   . History of swelling of feet   . Hyperlipidemia   . Hypertension   . Major depressive disorder, recurrent episode ([place]) 06/05/2015  . Obesity   . Snores    Review of Systems:   General: Denies fevers, chills, weight loss, fatigue HEENT: Denies changes in vision, sore throat, dysphagia Cardiac: Denies CP, SOB, palpitations Pulmonary: Denies cough, wheezes, PND Abd: Denies diarrhea, constipation, changes in bowels Extremities: Denies weakness or swelling  Physical Exam: General: Alert, in no acute distress. Tearful. HEENT: No icterus, injection or ptosis. No hoarseness or dysarthria  Cardiac: RRR, no MGR appreciated Pulmonary: CTA BL with normal WOB on RA. Able to speak in complete sentences Abd: Soft, non-tended. +bs Extremities: Warm, perfused. No significant pedal edema.   Assessment & Plan:   See Encounters Tab for problem based charting.  Patient discussed with Dr. Evette Doffing

## 2017-02-26 ENCOUNTER — Encounter: Payer: Self-pay | Admitting: Internal Medicine

## 2017-02-26 NOTE — Assessment & Plan Note (Signed)
BP controlled today. Has been compliant with her medications. She denies cough or dizziness.  -Continue Toprol XL 100mg  -Lisinopril 10mg  -Amlodipine 5mg 

## 2017-02-26 NOTE — Progress Notes (Signed)
Internal Medicine Clinic Attending  Case discussed with Dr. Danford Bad  at the time of the visit.  We reviewed the resident's history and exam and pertinent patient test results.  I agree with the assessment, diagnosis, and plan of care documented in the resident's note.  We discussed using Trazodone as the adjunctive for her difficult to control depression. Will avoid benzo prescriptions in the future.

## 2017-02-26 NOTE — Assessment & Plan Note (Signed)
I have referred patient for screening mammogram however has not completed yet. She also requires HepC and HIV screens which will be completed during her next blood draw.

## 2017-02-26 NOTE — Assessment & Plan Note (Signed)
PHQ-9 17 today. Patient reported minimal improvement with Prozac increase in August. She is quite tearful today and reports low motivation and difficultly sleeping. She denies any thoughts of wanting to harm herself or others. She doesn't feel like she has a great support system outside of her church however doesn't feel motivated to go. I encouraged patient to reach out to her church community, friends or family for support.  -Add Trazodone 50mg  -Continue Prozac 40mg . Consider transition to Wellbutrin at next visit if no improvement -Encouraged patient to seek counseling or reach out to those close to her for support -RTC 1 mo, or sooner if needed. -Asked patient to call clinic immediately should she note worsening symptoms

## 2017-02-27 ENCOUNTER — Ambulatory Visit (HOSPITAL_BASED_OUTPATIENT_CLINIC_OR_DEPARTMENT_OTHER): Payer: Medicaid Other | Admitting: Hematology

## 2017-02-27 ENCOUNTER — Encounter: Payer: Self-pay | Admitting: Hematology

## 2017-02-27 ENCOUNTER — Telehealth: Payer: Self-pay | Admitting: Oncology

## 2017-02-27 VITALS — BP 139/67 | HR 59 | Temp 98.4°F | Resp 18 | Ht 65.0 in | Wt 274.7 lb

## 2017-02-27 DIAGNOSIS — D473 Essential (hemorrhagic) thrombocythemia: Secondary | ICD-10-CM

## 2017-02-27 DIAGNOSIS — R05 Cough: Secondary | ICD-10-CM | POA: Diagnosis not present

## 2017-02-27 DIAGNOSIS — Z1231 Encounter for screening mammogram for malignant neoplasm of breast: Secondary | ICD-10-CM

## 2017-02-27 DIAGNOSIS — R040 Epistaxis: Secondary | ICD-10-CM | POA: Diagnosis not present

## 2017-02-27 DIAGNOSIS — D500 Iron deficiency anemia secondary to blood loss (chronic): Secondary | ICD-10-CM | POA: Diagnosis not present

## 2017-02-27 DIAGNOSIS — D72829 Elevated white blood cell count, unspecified: Secondary | ICD-10-CM

## 2017-02-27 NOTE — Telephone Encounter (Signed)
Gave patient avs and calendar with appts per 11/15 los.  °

## 2017-02-27 NOTE — Progress Notes (Signed)
[name]  [phone]) [phone] [phone]  Clinic Follow up Note   Patient Care Team: Einar Gip, DO as PCP - General (Internal Medicine) 02/27/2017   Oncological/hematological History (per Dr. Lebron Conners 11/28/2016): Peggy House a 56 y.o. female referred to the [facility] for evaluation of leukocytosis. Leukocytosis has been persistent, chronic and dating back to at least 2009. Concurrently, patient has been having mild to severe anemia associated with recurrent epistaxis appearance on a daily basis hour. 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 Last episode of severe upper GI bleeding appears to have occurred in January 2018. Patient reports a family history of recurrent nosebleeds in her aunt great-aunt with previous history of gastric ulcers. Patient has not had any ENT assessment so far for the epistaxis. She also has not Her appointments with gastroenterology to recheck itself the gastric ulcers or to have a colonoscopy since January 2018.  At this time, she continues to have fatigue, her symptoms are dominated by depressive symptoms that she attributes to 7 years ago. Patient has poor sleep with difficulties going to sleep due to perseveration. She has recently been Prozac, but does not appear to have significant benefit in terms of her mood yet. It appears that multiple psychosocial stressors are contributing to her depression at this time. Patient does not have very good coping mechanisms based in our conversation today. Physically, she denies any chest pain, shortness of breath, or cough at this time. Denies active abdominal discomfort, diarrhea, or melena. Denies night sweats, easy bruisability, or swollen glands in the neck, armpits, or groin.  CURRENT THERAPY: Ferrous sulfate 325 mg daily  INTERIM HISTORY Peggy House is here for a follow up of her iron deficient anemia. She was previously under  the care of Dr. Lebron Conners and has transferred her care to me. She presents to me for the first time today accompanied by her granddaughter.   She notes to being anemic for 10 years. She would have severe epistaxis which occurs when she gets hot or when she hold her head down for an extended time. She reports this is hereditary She also had GI bleeding from her stomach. She had blood in her stool and hematemesis. She has not had another episode of this in 3 years. She was admitted in hospital and a upper endoscopy found the bleeding source from her stomach. Her GI is Dr. Cristina Gong but has not seen him recently. She knows not to take NSAID's. She restarted her lasix recently. She ran out of the iron pill and feels oral iron is not working well for her. She is willing to try IV iron.  She notes a cough with phlegm that has started 2-3 months ago. This occurs throughout the day. She does smoke, 10 cigarettes a day.  She has not had a mammogram since 2010. 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 She no longer has periods. She has 3 children, all sons.    REVIEW OF SYSTEMS:   Constitutional: Denies fevers, chills or abnormal weight loss Eyes: Denies blurriness of vision Ears, nose, mouth, throat, and face: Denies mucositis or sore throat (+) epistaxis  Respiratory: Denies dyspnea or wheezes (+) cough with phlegm  Cardiovascular: Denies palpitation, chest discomfort or lower extremity swelling Gastrointestinal:  Denies nausea, heartburn or change in bowel habits Skin: Denies abnormal skin rashes Lymphatics: Denies new lymphadenopathy or easy bruising  Neurological:Denies numbness, tingling or new weaknesses Behavioral/Psych: Mood is stable, no new changes  All other systems were reviewed with the patient and are negative.  MEDICAL HISTORY:  Past Medical History:  Diagnosis Date  . Anxiety   . Arthritis    knnes,back  . GERD (gastroesophageal reflux  disease)   . History of swelling of feet   . Hyperlipidemia   . Hypertension   . Major depressive disorder, recurrent episode ([place]) 06/05/2015  . Obesity   . Snores     SURGICAL HISTORY: Past Surgical History:  Procedure Laterality Date  . ABDOMINAL HYSTERECTOMY    . CARPAL TUNNEL RELEASE  05/13/2011   Procedure: CARPAL TUNNEL RELEASE;  Surgeon: Nita Sells, MD;  Location: [facility];  Service: Orthopedics;  Laterality: Left;  . COLONOSCOPY WITH PROPOFOL N/A 04/28/2014   Procedure: COLONOSCOPY WITH PROPOFOL;  Surgeon: Cleotis Nipper, MD;  Location: [facility] ENDOSCOPY;  Service: Endoscopy;  Laterality: N/A;  . DG TOES*L*  2/10   rt  . DILATION AND CURETTAGE OF UTERUS    . ESOPHAGOGASTRODUODENOSCOPY N/A 04/10/2014   Procedure: ESOPHAGOGASTRODUODENOSCOPY (EGD);  Surgeon: [name], MD;  Location: [facility] ENDOSCOPY;  Service: Endoscopy;  Laterality: N/A;  . ESOPHAGOGASTRODUODENOSCOPY (EGD) WITH PROPOFOL N/A 04/27/2014   Procedure: ESOPHAGOGASTRODUODENOSCOPY (EGD) WITH PROPOFOL;  Surgeon: Cleotis Nipper, MD;  Location: [facility];  Service: Endoscopy;  Laterality: N/A;  possible apc  . HOT HEMOSTASIS N/A 04/27/2014   Procedure: HOT HEMOSTASIS (ARGON PLASMA COAGULATION/BICAP);  Surgeon: Cleotis Nipper, MD;  Location: [facility] ENDOSCOPY;  Service: Endoscopy;  Laterality: N/A;  . L shoulder Surgery  2011    I have reviewed the social history and family history with the patient and they are unchanged from previous note.  ALLERGIES:  is allergic to nsaids and tomato.  MEDICATIONS:  Current Outpatient Medications  Medication Sig Dispense Refill  . acetaminophen (TYLENOL) 325 MG tablet Take 650 mg by mouth every 6 (six) hours as needed.    [name] albuterol (PROVENTIL HFA;VENTOLIN HFA) 108 (90 Base) MCG/ACT inhaler Inhale 1-2 puffs into the lungs every 6 (six) hours as needed for wheezing or shortness of breath. 1 Inhaler 0  . ALPRAZolam (XANAX) 0.5 MG tablet Take 1 tablet  (0.5 mg total) by mouth at bedtime as needed for anxiety or sleep. 30 tablet 0  . amLODipine (NORVASC) 5 MG tablet Take 1 tablet (5 mg total) by mouth daily. 90 tablet 0  . atorvastatin (LIPITOR) 80 MG tablet take 1 tablet by mouth once daily 30 tablet 11  . Ferrous Sulfate (IRON) 325 (65 Fe) MG TABS Take 1 tablet by mouth daily. 30 each 6  . FLUoxetine (PROZAC) 20 MG capsule Take 2 capsules (40 mg total) by mouth daily. 60 capsule 0  . furosemide (LASIX) 40 MG tablet Take 1 tablet (40 mg total) by mouth daily. 30 tablet 2  . lisinopril (PRINIVIL,ZESTRIL) 10 MG tablet Take 1 tablet (10 mg total) daily by mouth. 30 tablet 11  . Melatonin 10 MG CAPS Take 10 mg at bedtime by mouth. 30 capsule 2  . metoprolol succinate (TOPROL-XL) 100 MG 24 hr tablet Take 1 tablet (100 mg total) daily by mouth. Take with or immediately following a meal. 90 tablet 0  . traZODone (DESYREL) 50 MG tablet Take 1 tablet (50 mg total) at bedtime by mouth. 30 tablet 1  . ferrous gluconate (FERGON) 324 MG tablet Take 1 tablet (324 mg total) by mouth 3 (three) times daily with meals.  90 tablet 0   No current facility-administered medications for this visit.     PHYSICAL EXAMINATION: ECOG PERFORMANCE STATUS: 1 - Symptomatic but completely ambulatory  Vitals:   02/27/17 1522  BP: 139/67  Pulse: (!) 59  Resp: 18  Temp: 98.4 F (36.9 C)  SpO2: 100%   Filed Weights   02/27/17 1522  Weight: 274 lb 11.2 oz (124.6 kg)    GENERAL:alert, no distress and comfortable SKIN: skin color, texture, turgor are normal, no rashes or significant lesions EYES: normal, Conjunctiva are pink and non-injected, sclera clear OROPHARYNX:no exudate, no erythema and lips, buccal mucosa, and tongue normal  NECK: supple, thyroid normal size, non-tender, without nodularity LYMPH:  no palpable lymphadenopathy in the cervical, axillary or inguinal LUNGS: clear to auscultation and percussion with normal breathing effort HEART: regular rate &  rhythm and no murmurs and no lower extremity edema ABDOMEN:abdomen soft, non-tender and normal bowel sounds Musculoskeletal:no cyanosis of digits and no clubbing  NEURO: alert & oriented x 3 with fluent speech, no focal motor/sensory deficits  LABORATORY DATA:  I have reviewed the data as listed CBC Latest Ref Rng & Units 02/05/2017 11/28/2016 10/29/2016  WBC 3.9 - 10.3 10e3/uL 13.5(H) 14.7(H) 12.5(H)  Hemoglobin 11.6 - 15.9 g/dL 8.1(L) 9.7(L) 9.4(L)  Hematocrit 34.8 - 46.6 % 26.6(L) 31.7(L) 31.6(L)  Platelets 145 - 400 10e3/uL 346 367 350     CMP Latest Ref Rng & Units 02/05/2017 11/28/2016 05/06/2016  Glucose 70 - 140 mg/dl 134 104 107(H)  BUN 7.0 - 26.0 mg/dL 9.3 6.5(L) 7  Creatinine 0.6 - 1.1 mg/dL 0.9 0.8 0.90  Sodium 136 - 145 mEq/L 141 143 139  Potassium 3.5 - 5.1 mEq/L 3.5 3.7 4.0  Chloride 101 - 111 mmol/L - - 106  CO2 22 - 29 mEq/L 24 26 25   Calcium 8.4 - 10.4 mg/dL 8.9 9.6 9.3  Total Protein 6.4 - 8.3 g/dL 7.1 7.6 -  Total Bilirubin 0.20 - 1.20 mg/dL 0.23 0.46 -  Alkaline Phos 40 - 150 U/L 84 112 -  AST 5 - 34 U/L 13 21 -  ALT 0 - 55 U/L 8 15 -      RADIOGRAPHIC STUDIES: I have personally reviewed the radiological images as listed and agreed with the findings in the report. No results found.   ASSESSMENT & PLAN:  Peggy House is a 56 y.o. African-American female with a history of GERD, HLD, HTN and obesity.    1. Iron Deficient Anemia -Secondary to chronic epistaxis and GI Bleeding from AVM  -She is currently on ferrous sulfate, but still has a moderate anemia.  Her last hemoglobin was 10.3 on 02/05/2017 -I suggest she receives IV iron to better improve her counts. I discussed the benefits and possible side effects, routine anaphylactic reaction, in great detail. She agrees to IV iron. She will receive IV feraheme over the next 2 weeks.  -He has had multiple endoscopy workup in the past -We will follow her with monthly labs and IV iron as needed. -F/u in 3  months    2. Reactive Leukocytosis and reactive thrombocytosis  -She has mild leukocytosis with dominant neutrophils, likely reactive -Also has intermittent to moderate thrombocytosis, likely reactive to iron deficiency -New monitoring  3. Chronic Epistaxis, h/o recurrent GI Bleeding -She had her latest colonoscopy and Endoscopy in 2016. Found to have AVM and peptic ulcer disease in the stomach. -Has not had episodes of blood in stool or hematemesis in 3 years. She will still  periodically have nose bleeds -I recommend she continue to follow up with her GI, Dr. Cristina Gong  4. Smoking cessation, Cough  -She smokes about 10 cigarettes a day -I think her recent cough is related to this  -I strongly recommend she try to cut back if she cannot completely quit. I explained the health risks that smoking can cause and contribute to.   5. Cancer screening  -I recommend she is up to date with her cancer screenings. She is past due for her mammogram. -Next mammogram is 03/2017  PLAN:  -feraheme infusion weekly X2 in the next 2-3 weeks  -Lab monthly x3 -F/u in 3 months  -Mammogram in one month at [facility]      No problem-specific Assessment & Plan notes found for this encounter.   Orders Placed This Encounter  Procedures  . MM Digital Screening    Standing Status:   Future    Standing Expiration Date:   02/27/2018    Order Specific Question:   Reason for Exam (SYMPTOM  OR DIAGNOSIS REQUIRED)    Answer:   screening    Order Specific Question:   Is the patient pregnant?    Answer:   No    Order Specific Question:   Preferred imaging location?    Answer:   [facility]  . CBC & Diff and Retic    Standing Status:   Standing    Number of Occurrences:   40    Standing Expiration Date:   02/27/2022  . Iron and TIBC    Standing Status:   Standing    Number of Occurrences:   40    Standing Expiration Date:   02/27/2022  . Ferritin    Standing Status:   Standing    Number of Occurrences:    40    Standing Expiration Date:   02/27/2022   All questions were answered. The patient knows to call the clinic with any problems, questions or concerns. No barriers to learning was detected. I spent 25 minutes counseling the patient face to face. The total time spent in the appointment was 30 minutes and more than 50% was on counseling and review of test results     Truitt Merle, MD 02/27/2017   This document serves as a record of services personally performed by Truitt Merle, MD. It was created on her behalf by Joslyn Devon, a trained medical scribe. The creation of this record is based on the scribe's personal observations and the provider's statements to them.    I have reviewed the above documentation for accuracy and completeness, and I agree with the above.

## 2017-03-01 ENCOUNTER — Encounter: Payer: Self-pay | Admitting: Hematology

## 2017-03-07 ENCOUNTER — Ambulatory Visit: Payer: Medicaid Other

## 2017-03-07 ENCOUNTER — Other Ambulatory Visit: Payer: Medicaid Other

## 2017-03-14 ENCOUNTER — Other Ambulatory Visit: Payer: Self-pay | Admitting: Internal Medicine

## 2017-03-14 ENCOUNTER — Ambulatory Visit: Payer: Medicaid Other

## 2017-04-01 ENCOUNTER — Ambulatory Visit: Payer: Medicaid Other

## 2017-04-02 ENCOUNTER — Encounter: Payer: Self-pay | Admitting: Internal Medicine

## 2017-04-02 ENCOUNTER — Ambulatory Visit (INDEPENDENT_AMBULATORY_CARE_PROVIDER_SITE_OTHER): Payer: Medicaid Other | Admitting: Internal Medicine

## 2017-04-02 ENCOUNTER — Encounter (INDEPENDENT_AMBULATORY_CARE_PROVIDER_SITE_OTHER): Payer: Self-pay

## 2017-04-02 ENCOUNTER — Other Ambulatory Visit: Payer: Self-pay

## 2017-04-02 VITALS — BP 149/68 | HR 66 | Temp 98.1°F | Wt 281.1 lb

## 2017-04-02 DIAGNOSIS — R040 Epistaxis: Secondary | ICD-10-CM

## 2017-04-02 DIAGNOSIS — R509 Fever, unspecified: Secondary | ICD-10-CM

## 2017-04-02 DIAGNOSIS — F17210 Nicotine dependence, cigarettes, uncomplicated: Secondary | ICD-10-CM

## 2017-04-02 DIAGNOSIS — R062 Wheezing: Secondary | ICD-10-CM

## 2017-04-02 DIAGNOSIS — R05 Cough: Secondary | ICD-10-CM | POA: Diagnosis not present

## 2017-04-02 DIAGNOSIS — J029 Acute pharyngitis, unspecified: Secondary | ICD-10-CM | POA: Diagnosis not present

## 2017-04-02 DIAGNOSIS — D72829 Elevated white blood cell count, unspecified: Secondary | ICD-10-CM | POA: Diagnosis not present

## 2017-04-02 DIAGNOSIS — R093 Abnormal sputum: Secondary | ICD-10-CM | POA: Diagnosis not present

## 2017-04-02 DIAGNOSIS — R0602 Shortness of breath: Secondary | ICD-10-CM | POA: Diagnosis not present

## 2017-04-02 DIAGNOSIS — R058 Other specified cough: Secondary | ICD-10-CM

## 2017-04-02 NOTE — Patient Instructions (Addendum)
I'm sorry you aren't feeling well today! It sounds like you might have a bacterial infection following having the flu. With your persistent cough, coughing up stuff and shortness of breath you might have either pneumonia or bronchitis. I am going to get a chest x-ray for a closer look. I am also going to get some blood work to make sure your blood levels arent too low.  Please call the clinic immediately should your symptoms change!

## 2017-04-02 NOTE — Progress Notes (Signed)
   CC: follow-up of epistaxis, new cough cough  HPI:  Peggy House is a 56 y.o. female with chronic leukocytosis and recurrent epistaxis here for evaluation of cough. Patient reports that 3 weeks ago she developed sudden symptoms of fatigue, fevers, chills, myalgia, cough, nasal congestion and chest congestion. Symptoms persisted for about 1 week however did start to improve on their own. Patient notes she was nearly at her baseline until one week ago when she developed objective fever, shaking chills, fatigue, cough productive of brown sputum, and shortness of breath with activity. She also notes her epistaxis has worsened during this time and also complains of coughing up small volume of blood.  Past Medical History:  Diagnosis Date  . Anxiety   . Arthritis    knnes,back  . GERD (gastroesophageal reflux disease)   . History of swelling of feet   . Hyperlipidemia   . Hypertension   . Major depressive disorder, recurrent episode ([place]) 06/05/2015  . Obesity   . Snores    Review of Systems:   General: +Fever, chills, fatigue. Denies weight loss HEENT: +cough, congestion. +mild sore throat. Denies changes in vision, dysphagia Cardiac: +SOB, chest discomfort.  Pulmonary: +Wheezing, chest congestion. +hemoptysis. Abd: Denies diarrhea, constipation, hematochezia  Physical Exam: General: Alert, in no acute distress. Pleasant and conversant. Fatigued and congested.  HEENT: No maxillary or frontal sinus tenderness. Oropharynx mildly erythematous.  Cardiac: Tachycardic but otherwise regular, no MGR appreciated Pulmonary: Difficult to appreciate given body habitus but diminished. No wheezing, crackles or rhonchi appreciated. +Cough while talking and deep inspiration, productive of tan sputum  Abd: Soft, non-tender. +bs  Vitals:   04/02/17 1608  BP: (!) 149/68  Pulse: 66  Temp: 98.1 F (36.7 C)  TempSrc: Oral  SpO2: 100%  Weight: 281 lb 1.6 oz (127.5 kg)   Assessment & Plan:    See Encounters Tab for problem based charting.  Patient discussed with Dr. Evette Doffing

## 2017-04-03 ENCOUNTER — Telehealth: Payer: Self-pay | Admitting: *Deleted

## 2017-04-03 ENCOUNTER — Other Ambulatory Visit: Payer: Self-pay | Admitting: Internal Medicine

## 2017-04-03 ENCOUNTER — Encounter: Payer: Self-pay | Admitting: Internal Medicine

## 2017-04-03 LAB — CMP14 + ANION GAP
A/G RATIO: 1.3 (ref 1.2–2.2)
ALT: 9 IU/L (ref 0–32)
ANION GAP: 16 mmol/L (ref 10.0–18.0)
AST: 22 IU/L (ref 0–40)
Albumin: 3.8 g/dL (ref 3.5–5.5)
Alkaline Phosphatase: 105 IU/L (ref 39–117)
BUN/Creatinine Ratio: 6 — ABNORMAL LOW (ref 9–23)
BUN: 5 mg/dL — AB (ref 6–24)
Bilirubin Total: 0.4 mg/dL (ref 0.0–1.2)
CALCIUM: 9.1 mg/dL (ref 8.7–10.2)
CO2: 25 mmol/L (ref 20–29)
CREATININE: 0.8 mg/dL (ref 0.57–1.00)
Chloride: 106 mmol/L (ref 96–106)
GFR, EST AFRICAN AMERICAN: 95 mL/min/{1.73_m2} (ref >59)
GFR, EST NON AFRICAN AMERICAN: 83 mL/min/{1.73_m2} (ref >59)
GLUCOSE: 117 mg/dL — AB (ref 65–99)
Globulin, Total: 2.9 g/dL (ref 1.5–4.5)
Potassium: 4.4 mmol/L (ref 3.5–5.2)
Sodium: 147 mmol/L — ABNORMAL HIGH (ref 134–144)
TOTAL PROTEIN: 6.7 g/dL (ref 6.0–8.5)

## 2017-04-03 MED ORDER — FLUOXETINE HCL 20 MG PO CAPS: 40.0000 mg | ORAL_CAPSULE | Freq: Every day | ORAL | 5 refills | Status: DC

## 2017-04-03 MED ORDER — AZITHROMYCIN 250 MG PO TABS: ORAL_TABLET | ORAL | 0 refills | Status: AC

## 2017-04-03 MED ORDER — TRAZODONE HCL 50 MG PO TABS: 50.0000 mg | ORAL_TABLET | Freq: Every day | ORAL | 5 refills | Status: DC

## 2017-04-03 MED ORDER — ALBUTEROL SULFATE HFA 108 (90 BASE) MCG/ACT IN AERS: 1.0000 | INHALATION_SPRAY | Freq: Four times a day (QID) | RESPIRATORY_TRACT | 3 refills | Status: DC | PRN

## 2017-04-03 NOTE — Addendum Note (Signed)
Addended by: Tamsen Roers on: 04/03/2017 05:16 PM   Modules accepted: Orders

## 2017-04-03 NOTE — Telephone Encounter (Signed)
They've been called!

## 2017-04-03 NOTE — Telephone Encounter (Signed)
Pt wants you to call in her abx so she, will get chest xray 12/21

## 2017-04-03 NOTE — Telephone Encounter (Signed)
Pt wants abx called in

## 2017-04-03 NOTE — Assessment & Plan Note (Signed)
History of productive cough, fevers, chills, myalgias and fatigue following viral URI 2 weeks prior. Her lung exam was clear to auscultation however could've been limited due to body habitus. I suspect this is viral however given her recent viral URI will order chest x-ray to evaluate for community acquired pneumonia. We'll treat conservatively until results are back. If suggestive of pneumonia, will start azithromycin. -Follow-up chest x-ray, treat based on results -Patient given strict instructions to call clinic or present to the emergency department should her symptoms worsen or she develop high-grade fevers

## 2017-04-03 NOTE — Telephone Encounter (Signed)
Patient is checking to if some medicine was called in [place]

## 2017-04-04 ENCOUNTER — Other Ambulatory Visit: Payer: Medicaid Other

## 2017-04-04 NOTE — Progress Notes (Signed)
Internal Medicine Clinic Attending  Case discussed with Dr. Molt at the time of the visit.  We reviewed the resident's history and exam and pertinent patient test results.  I agree with the assessment, diagnosis, and plan of care documented in the resident's note. 

## 2017-04-09 ENCOUNTER — Emergency Department (HOSPITAL_COMMUNITY)
Admission: EM | Admit: 2017-04-09 | Discharge: 2017-04-09 | Disposition: A | Discharge status: Home / Self Care | Payer: Medicaid Other | Attending: Emergency Medicine | Admitting: Emergency Medicine

## 2017-04-09 ENCOUNTER — Encounter (HOSPITAL_COMMUNITY): Payer: Self-pay | Admitting: Emergency Medicine

## 2017-04-09 ENCOUNTER — Emergency Department (HOSPITAL_COMMUNITY): Payer: Medicaid Other

## 2017-04-09 DIAGNOSIS — R0602 Shortness of breath: Secondary | ICD-10-CM | POA: Diagnosis not present

## 2017-04-09 DIAGNOSIS — R05 Cough: Secondary | ICD-10-CM | POA: Diagnosis not present

## 2017-04-09 DIAGNOSIS — Z5321 Procedure and treatment not carried out due to patient leaving prior to being seen by health care provider: Secondary | ICD-10-CM | POA: Diagnosis not present

## 2017-04-09 LAB — CBC WITH DIFFERENTIAL/PLATELET
BASOS ABS: 0 10*3/uL (ref 0.0–0.1)
Basophils Relative: 0 %
EOS ABS: 0.1 10*3/uL (ref 0.0–0.7)
Eosinophils Relative: 1 %
HCT: 23.1 % — ABNORMAL LOW (ref 36.0–46.0)
HEMOGLOBIN: 6.3 g/dL — AB (ref 12.0–15.0)
LYMPHS PCT: 24 %
Lymphs Abs: 3.3 10*3/uL (ref 0.7–4.0)
MCH: 22.4 pg — ABNORMAL LOW (ref 26.0–34.0)
MCHC: 27.3 g/dL — AB (ref 30.0–36.0)
MCV: 82.2 fL (ref 78.0–100.0)
Monocytes Absolute: 0.4 10*3/uL (ref 0.1–1.0)
Monocytes Relative: 3 %
NEUTROS ABS: 9.9 10*3/uL — AB (ref 1.7–7.7)
NEUTROS PCT: 72 %
Platelets: 341 10*3/uL (ref 150–400)
RBC: 2.81 MIL/uL — AB (ref 3.87–5.11)
RDW: 21.6 % — ABNORMAL HIGH (ref 11.5–15.5)
WBC: 13.7 10*3/uL — AB (ref 4.0–10.5)

## 2017-04-09 LAB — COMPREHENSIVE METABOLIC PANEL
ALK PHOS: 94 U/L (ref 38–126)
ALT: 10 U/L — ABNORMAL LOW (ref 14–54)
ANION GAP: 9 (ref 5–15)
AST: 15 U/L (ref 15–41)
Albumin: 3.3 g/dL — ABNORMAL LOW (ref 3.5–5.0)
BILIRUBIN TOTAL: 0.4 mg/dL (ref 0.3–1.2)
BUN: 7 mg/dL (ref 6–20)
CALCIUM: 8.8 mg/dL — AB (ref 8.9–10.3)
CO2: 28 mmol/L (ref 22–32)
Chloride: 103 mmol/L (ref 101–111)
Creatinine, Ser: 0.95 mg/dL (ref 0.44–1.00)
Glucose, Bld: 124 mg/dL — ABNORMAL HIGH (ref 65–99)
POTASSIUM: 3.1 mmol/L — AB (ref 3.5–5.1)
Sodium: 140 mmol/L (ref 135–145)
TOTAL PROTEIN: 7 g/dL (ref 6.5–8.1)

## 2017-04-09 NOTE — ED Notes (Addendum)
Patient states she was told by her PCP to get an x-ray on 12/19. Patient states she could not do it at that time and is here today because radiology told her to come to the ED. Patient states she is confused about why she is back in the room when all she was doing is getting a chest x-ray for her PCP. Patient does not wish any treatment or vitals at this time. MD notified of situation.

## 2017-04-09 NOTE — ED Notes (Addendum)
Patient stated she does not want to see another physician and is leaving. Patient stated all she wanted was the X-Rays her PCP ordered. Patient will discuss the results of her lab work with her PCP and refused vitals at this time. Patient states she does not want to pay for two visits when she doesn't need it.

## 2017-04-09 NOTE — ED Triage Notes (Signed)
Pt to ER for one week cough and congestion, states was sent here by PCP for rule out pneumonia. States brown sputum production. NAD in triage. VSS.

## 2017-04-17 ENCOUNTER — Ambulatory Visit: Payer: Medicaid Other

## 2017-04-17 ENCOUNTER — Telehealth: Payer: Self-pay

## 2017-04-17 NOTE — Telephone Encounter (Signed)
Hello! The most recent CXR I see in the chart is from 12/26 from the emergency department which was normal. Did she get another? Thanks!

## 2017-04-17 NOTE — Telephone Encounter (Signed)
Requesting xray result. Please call pt back. 

## 2017-04-18 NOTE — Telephone Encounter (Signed)
I called her and she stated you ordered the cxr, she states she forgot where you told her to go and she went to the ED, she states she did not see a doctor there, she was informed that per you it is normal but she still wants to talk to you, can you call her?

## 2017-04-21 NOTE — Telephone Encounter (Signed)
Sure, will give a call this afternoon! Thanks Bonnita Nasuti!

## 2017-04-29 ENCOUNTER — Telehealth: Payer: Self-pay

## 2017-04-29 NOTE — Telephone Encounter (Signed)
Requesting lab results. Please call pt back.  

## 2017-04-30 ENCOUNTER — Encounter: Payer: Self-pay | Admitting: Internal Medicine

## 2017-04-30 ENCOUNTER — Ambulatory Visit: Payer: Medicaid Other

## 2017-04-30 ENCOUNTER — Telehealth: Payer: Self-pay | Admitting: Internal Medicine

## 2017-04-30 NOTE — Telephone Encounter (Deleted)
Hey! I've tried calling this patient several times at the number she has listed in her chart without answer. The results of the chest x-ray and labs were normal. If she still would like to talk to me please have her provide a number I can reach her at.  Thanks!

## 2017-04-30 NOTE — Telephone Encounter (Signed)
Patient is calling checking on chest xray

## 2017-05-01 NOTE — Telephone Encounter (Signed)
Tried to call pt, lm for rtc, called [phone]

## 2017-05-02 ENCOUNTER — Ambulatory Visit: Payer: Medicaid Other

## 2017-05-02 ENCOUNTER — Other Ambulatory Visit: Payer: Medicaid Other

## 2017-05-07 ENCOUNTER — Inpatient Hospital Stay (HOSPITAL_COMMUNITY)
Admission: AD | Admit: 2017-05-07 | Discharge: 2017-05-11 | DRG: 378 | Disposition: A | Discharge status: Home / Self Care | Payer: Medicaid Other | Source: Ambulatory Visit | Attending: Student in an Organized Health Care Education/Training Program | Admitting: Student in an Organized Health Care Education/Training Program

## 2017-05-07 ENCOUNTER — Encounter: Payer: Self-pay | Admitting: Internal Medicine

## 2017-05-07 ENCOUNTER — Encounter (HOSPITAL_COMMUNITY): Payer: Self-pay | Admitting: Internal Medicine

## 2017-05-07 ENCOUNTER — Ambulatory Visit (INDEPENDENT_AMBULATORY_CARE_PROVIDER_SITE_OTHER): Payer: Medicaid Other | Admitting: Internal Medicine

## 2017-05-07 VITALS — BP 158/79 | HR 70 | Temp 98.0°F | Ht 66.0 in | Wt 266.8 lb

## 2017-05-07 DIAGNOSIS — L814 Other melanin hyperpigmentation: Secondary | ICD-10-CM | POA: Diagnosis not present

## 2017-05-07 DIAGNOSIS — D62 Acute posthemorrhagic anemia: Secondary | ICD-10-CM

## 2017-05-07 DIAGNOSIS — F17210 Nicotine dependence, cigarettes, uncomplicated: Secondary | ICD-10-CM | POA: Diagnosis present

## 2017-05-07 DIAGNOSIS — Z6841 Body Mass Index (BMI) 40.0 and over, adult: Secondary | ICD-10-CM

## 2017-05-07 DIAGNOSIS — M170 Bilateral primary osteoarthritis of knee: Secondary | ICD-10-CM | POA: Diagnosis present

## 2017-05-07 DIAGNOSIS — Z833 Family history of diabetes mellitus: Secondary | ICD-10-CM | POA: Diagnosis not present

## 2017-05-07 DIAGNOSIS — R600 Localized edema: Secondary | ICD-10-CM | POA: Diagnosis not present

## 2017-05-07 DIAGNOSIS — D500 Iron deficiency anemia secondary to blood loss (chronic): Secondary | ICD-10-CM

## 2017-05-07 DIAGNOSIS — I509 Heart failure, unspecified: Secondary | ICD-10-CM | POA: Diagnosis present

## 2017-05-07 DIAGNOSIS — Z841 Family history of disorders of kidney and ureter: Secondary | ICD-10-CM

## 2017-05-07 DIAGNOSIS — Z886 Allergy status to analgesic agent status: Secondary | ICD-10-CM

## 2017-05-07 DIAGNOSIS — K219 Gastro-esophageal reflux disease without esophagitis: Secondary | ICD-10-CM | POA: Diagnosis present

## 2017-05-07 DIAGNOSIS — F339 Major depressive disorder, recurrent, unspecified: Secondary | ICD-10-CM | POA: Diagnosis present

## 2017-05-07 DIAGNOSIS — Z91018 Allergy to other foods: Secondary | ICD-10-CM

## 2017-05-07 DIAGNOSIS — K31811 Angiodysplasia of stomach and duodenum with bleeding: Principal | ICD-10-CM | POA: Diagnosis present

## 2017-05-07 DIAGNOSIS — E785 Hyperlipidemia, unspecified: Secondary | ICD-10-CM | POA: Diagnosis present

## 2017-05-07 DIAGNOSIS — G4733 Obstructive sleep apnea (adult) (pediatric): Secondary | ICD-10-CM | POA: Diagnosis present

## 2017-05-07 DIAGNOSIS — Z8041 Family history of malignant neoplasm of ovary: Secondary | ICD-10-CM

## 2017-05-07 DIAGNOSIS — E669 Obesity, unspecified: Secondary | ICD-10-CM | POA: Diagnosis not present

## 2017-05-07 DIAGNOSIS — I110 Hypertensive heart disease with heart failure: Secondary | ICD-10-CM | POA: Diagnosis present

## 2017-05-07 DIAGNOSIS — K259 Gastric ulcer, unspecified as acute or chronic, without hemorrhage or perforation: Secondary | ICD-10-CM | POA: Diagnosis not present

## 2017-05-07 DIAGNOSIS — Z79899 Other long term (current) drug therapy: Secondary | ICD-10-CM

## 2017-05-07 DIAGNOSIS — D600 Chronic acquired pure red cell aplasia: Secondary | ICD-10-CM

## 2017-05-07 DIAGNOSIS — D72829 Elevated white blood cell count, unspecified: Secondary | ICD-10-CM | POA: Diagnosis not present

## 2017-05-07 DIAGNOSIS — F419 Anxiety disorder, unspecified: Secondary | ICD-10-CM | POA: Diagnosis present

## 2017-05-07 DIAGNOSIS — L859 Epidermal thickening, unspecified: Secondary | ICD-10-CM

## 2017-05-07 DIAGNOSIS — Z800 Family history of malignant neoplasm of digestive organs: Secondary | ICD-10-CM

## 2017-05-07 DIAGNOSIS — R040 Epistaxis: Secondary | ICD-10-CM | POA: Diagnosis present

## 2017-05-07 DIAGNOSIS — Z8719 Personal history of other diseases of the digestive system: Secondary | ICD-10-CM

## 2017-05-07 DIAGNOSIS — I10 Essential (primary) hypertension: Secondary | ICD-10-CM | POA: Diagnosis not present

## 2017-05-07 DIAGNOSIS — Z8711 Personal history of peptic ulcer disease: Secondary | ICD-10-CM

## 2017-05-07 DIAGNOSIS — I872 Venous insufficiency (chronic) (peripheral): Secondary | ICD-10-CM | POA: Diagnosis not present

## 2017-05-07 DIAGNOSIS — Z90710 Acquired absence of both cervix and uterus: Secondary | ICD-10-CM

## 2017-05-07 DIAGNOSIS — R011 Cardiac murmur, unspecified: Secondary | ICD-10-CM | POA: Diagnosis not present

## 2017-05-07 DIAGNOSIS — I780 Hereditary hemorrhagic telangiectasia: Secondary | ICD-10-CM

## 2017-05-07 DIAGNOSIS — D72828 Other elevated white blood cell count: Secondary | ICD-10-CM | POA: Diagnosis not present

## 2017-05-07 DIAGNOSIS — D689 Coagulation defect, unspecified: Secondary | ICD-10-CM | POA: Diagnosis not present

## 2017-05-07 DIAGNOSIS — Q2733 Arteriovenous malformation of digestive system vessel: Secondary | ICD-10-CM

## 2017-05-07 DIAGNOSIS — K5521 Angiodysplasia of colon with hemorrhage: Secondary | ICD-10-CM | POA: Diagnosis not present

## 2017-05-07 DIAGNOSIS — D649 Anemia, unspecified: Secondary | ICD-10-CM | POA: Diagnosis present

## 2017-05-07 DIAGNOSIS — I080 Rheumatic disorders of both mitral and aortic valves: Secondary | ICD-10-CM | POA: Diagnosis present

## 2017-05-07 DIAGNOSIS — Z9889 Other specified postprocedural states: Secondary | ICD-10-CM | POA: Diagnosis not present

## 2017-05-07 DIAGNOSIS — K0889 Other specified disorders of teeth and supporting structures: Secondary | ICD-10-CM

## 2017-05-07 DIAGNOSIS — M7989 Other specified soft tissue disorders: Secondary | ICD-10-CM

## 2017-05-07 DIAGNOSIS — M479 Spondylosis, unspecified: Secondary | ICD-10-CM | POA: Diagnosis present

## 2017-05-07 DIAGNOSIS — K5520 Angiodysplasia of colon without hemorrhage: Secondary | ICD-10-CM | POA: Diagnosis not present

## 2017-05-07 LAB — CBC WITH DIFFERENTIAL/PLATELET
BASOS ABS: 0 10*3/uL (ref 0.0–0.1)
BASOS PCT: 0 %
EOS ABS: 0.1 10*3/uL (ref 0.0–0.7)
Eosinophils Relative: 1 %
HEMATOCRIT: 21.1 % — AB (ref 36.0–46.0)
Hemoglobin: 5.7 g/dL — CL (ref 12.0–15.0)
Lymphocytes Relative: 21 %
Lymphs Abs: 2 10*3/uL (ref 0.7–4.0)
MCH: 20.6 pg — ABNORMAL LOW (ref 26.0–34.0)
MCHC: 27 g/dL — ABNORMAL LOW (ref 30.0–36.0)
MCV: 76.2 fL — ABNORMAL LOW (ref 78.0–100.0)
MONO ABS: 0.3 10*3/uL (ref 0.1–1.0)
Monocytes Relative: 3 %
NEUTROS ABS: 7.1 10*3/uL (ref 1.7–7.7)
Neutrophils Relative %: 75 %
PLATELETS: 296 10*3/uL (ref 150–400)
RBC: 2.77 MIL/uL — ABNORMAL LOW (ref 3.87–5.11)
RDW: 20.9 % — AB (ref 11.5–15.5)
WBC: 9.5 10*3/uL (ref 4.0–10.5)

## 2017-05-07 LAB — COMPREHENSIVE METABOLIC PANEL
ALBUMIN: 3.3 g/dL — AB (ref 3.5–5.0)
ALT: 8 U/L — ABNORMAL LOW (ref 14–54)
AST: 13 U/L — ABNORMAL LOW (ref 15–41)
Alkaline Phosphatase: 86 U/L (ref 38–126)
Anion gap: 12 (ref 5–15)
BILIRUBIN TOTAL: 0.3 mg/dL (ref 0.3–1.2)
BUN: 6 mg/dL (ref 6–20)
CO2: 22 mmol/L (ref 22–32)
Calcium: 8.9 mg/dL (ref 8.9–10.3)
Chloride: 106 mmol/L (ref 101–111)
Creatinine, Ser: 0.92 mg/dL (ref 0.44–1.00)
GFR calc Af Amer: >60 mL/min (ref >60)
GFR calc non Af Amer: >60 mL/min (ref >60)
GLUCOSE: 109 mg/dL — AB (ref 65–99)
POTASSIUM: 3.8 mmol/L (ref 3.5–5.1)
SODIUM: 140 mmol/L (ref 135–145)
Total Protein: 6.9 g/dL (ref 6.5–8.1)

## 2017-05-07 LAB — PREPARE RBC (CROSSMATCH)

## 2017-05-07 MED ORDER — TRAZODONE HCL 50 MG PO TABS
50.0000 mg | ORAL_TABLET | Freq: Every day | ORAL | Status: DC
  Administered 2017-05-07 – 2017-05-10 (×4): 50 mg via ORAL
  Filled 2017-05-07 (×4): qty 1

## 2017-05-07 MED ORDER — ATORVASTATIN CALCIUM 80 MG PO TABS
80.0000 mg | ORAL_TABLET | Freq: Every day | ORAL | Status: DC
  Administered 2017-05-08 – 2017-05-11 (×4): 80 mg via ORAL
  Filled 2017-05-07 (×4): qty 1

## 2017-05-07 MED ORDER — ACETAMINOPHEN 325 MG PO TABS
650.0000 mg | ORAL_TABLET | Freq: Four times a day (QID) | ORAL | Status: DC | PRN
Start: 2017-05-07 — End: 2017-05-11
  Administered 2017-05-08 – 2017-05-09 (×2): 650 mg via ORAL
  Filled 2017-05-07 (×2): qty 2

## 2017-05-07 MED ORDER — SODIUM CHLORIDE 0.9 % IV SOLN
250.0000 mL | INTRAVENOUS | Status: DC | PRN
Start: 2017-05-07 — End: 2017-05-11

## 2017-05-07 MED ORDER — ALPRAZOLAM 0.5 MG PO TABS: 0.5000 mg | ORAL_TABLET | Freq: Every evening | ORAL | Status: DC | PRN

## 2017-05-07 MED ORDER — TRAMADOL HCL 50 MG PO TABS
50.0000 mg | ORAL_TABLET | Freq: Four times a day (QID) | ORAL | Status: DC | PRN
  Administered 2017-05-08 – 2017-05-10 (×4): 50 mg via ORAL
  Filled 2017-05-07 (×4): qty 1

## 2017-05-07 MED ORDER — SODIUM CHLORIDE 0.9% FLUSH
3.0000 mL | INTRAVENOUS | Status: DC | PRN
  Administered 2017-05-07: 3 mL via INTRAVENOUS
  Filled 2017-05-07: qty 3

## 2017-05-07 MED ORDER — SODIUM CHLORIDE 0.9% FLUSH: 3.0000 mL | Freq: Two times a day (BID) | INTRAVENOUS | Status: DC

## 2017-05-07 MED ORDER — TRAMADOL HCL 50 MG PO TABS: 50.0000 mg | ORAL_TABLET | Freq: Four times a day (QID) | ORAL | Status: DC | PRN

## 2017-05-07 MED ORDER — FLUOXETINE HCL 20 MG PO CAPS
40.0000 mg | ORAL_CAPSULE | Freq: Every day | ORAL | Status: DC
  Administered 2017-05-08 – 2017-05-11 (×4): 40 mg via ORAL
  Filled 2017-05-07 (×4): qty 2

## 2017-05-07 MED ORDER — SODIUM CHLORIDE 0.9 % IV SOLN: Freq: Once | INTRAVENOUS | Status: DC

## 2017-05-07 MED ORDER — FERROUS SULFATE 325 (65 FE) MG PO TABS
325.0000 mg | ORAL_TABLET | Freq: Every day | ORAL | Status: DC
  Administered 2017-05-08 – 2017-05-11 (×3): 325 mg via ORAL
  Filled 2017-05-07 (×3): qty 1

## 2017-05-07 MED ORDER — SODIUM CHLORIDE 0.9% FLUSH
3.0000 mL | Freq: Two times a day (BID) | INTRAVENOUS | Status: DC
  Administered 2017-05-07 – 2017-05-11 (×8): 3 mL via INTRAVENOUS

## 2017-05-07 NOTE — Assessment & Plan Note (Signed)
Patient describes a several week history of BL LE burning pain which is present from the calves down. She also endorsed darkening of her skin around her ankles and shins. She hasnt noticed any exacerbating or remitting factors, and denied worsening with ambulation or elevation.  Believe changes are most consistent with venous stasis dermatitis of both lower extremities. In-house ABI performed today were normal with left ABI 1.15 and right ABI 1.11 with normal waveforms. -Consider compression stockings

## 2017-05-07 NOTE — Progress Notes (Signed)
   CC: follow-up of ED visit, blood in stool, leg pain  HPI:  [name] is a 57 y.o. F with history of persistent leukocytosis felt to be secondary to recurrent epistaxis, chronic blood loss anemia anemia, history of PUD and AVM with most recent GIB 04/2016. She was seen in the ED 04/09/18. CXR was ordered 12/19 for evaluation of shortness of breath and persistent cough. She presented to the ED and was triaged instead of taken to radiology. Triage blood work revealed hypokalemia to 3.1 and Hb of 6.3. She left before being seen.   She is here today with complaints of BL LE burning pain and worsening skin darkening for several weeks. Denied any exacerbating or remitting factors, including ambulation or elevation. She does note her legs have been more swollen over the past few weeks and has subsequently resumed lasix with improvement.   She admits to red blood in stool for at least a few days as well as generalized fatigue. No hemoptysis or hematemesis. She has been taking up to 3 tylenol per day for her leg pain.  Past Medical History:  Diagnosis Date  . Anxiety   . Arthritis    knnes,back  . GERD (gastroesophageal reflux disease)   . History of swelling of feet   . Hyperlipidemia   . Hypertension   . Major depressive disorder, recurrent episode ([place]) 06/05/2015  . Obesity   . Snores    Review of Systems:   General: +Fatigue. Denies fevers, chills HEENT: Denies dysphagia, odynophagia, sore throat, hemoptysis Cardiac: +SOB. Denies CP, palpitations Pulmonary: Denies wheezes Abd: +Diarrhea. +Blood. +occasional abdominal pain.  Extremities: +weakness. +swelling. +discoloration.   Physical Exam: General: Alert, in no acute distress. Pleasant and conversant but looks fatigued.  HEENT: +Pale conjunctiva. +JVD. No icterus, injection or ptosis. No hoarseness or dysarthria.  Cardiac: RRR. Faint murmur LUSB. Pulmonary: CTA BL with normal WOB on RA. Speaks in complete sentences. Abd:  Soft, non-tender. +bs Extremities: Warm. No significant pedal edema. BL hyperpigmentation of ankles, shins.   Vitals:   05/07/17 1339  BP: (!) 158/79  Pulse: 70  Temp: 98 F (36.7 C)  TempSrc: Oral  SpO2: 100%  Weight: 266 lb 12.8 oz (121 kg)  Height: 5\' 6"  (1.676 m)   Assessment & Plan:   See Encounters Tab for problem based charting.  Patient discussed with Dr. Daryll Drown

## 2017-05-07 NOTE — Assessment & Plan Note (Addendum)
Patient notes a several week history of LE edema, which was resolved when she resumed PO Lasix 40mg . She was previously prescribed this for lower extremity swelling but had not taken this in several months. She also complains of DOE and CXR 12/26 showed cardiomegaly and pulmonary vascular congestion. She does not have much edema on exam today and I did not appreciate significant crackles.  ECHO ordered in the past but not completed. I suspect she may have underlying HF and would benefit from ECHO while admitted. Severe chronic anemia could be contributing to her cardiomyopathy, if present. -ECHO during admission -Severe anemia could be contributing to cardiomyopathy

## 2017-05-07 NOTE — Assessment & Plan Note (Addendum)
She has chronic blood loss from chronic epistaxis and follows with hematology. Compliant with her PO iron and has refused IV iron. Hb 6.3 on 12/26 when she presented to [facility] for other reasons and STAT CBC here shows Hb of 5.7. She does note a few week history of BRB in stool, but denied large volume hematochezia. Denied any hemoptysis or hematemesis and is s/p hysterectomy. She has hx of GIB with known AVMs and was admitted 04/2014 for symptomatic anemia.  She complains of fatigue and generalized weakness. When discussing the need for transfusions, she stated she felt like she needed blood. The patient requires 2-3 units PRB and will be admitted for further management. Suspect GI source, may benefit from GI evaluation if unable to maintain appropriate Hb. This was discussed with IMTS who accepts the admission. The patient is returning home briefly but was instructed to return and she ensures she will return this evening.  -Admit to telemetry -Transfuse 3 units PRB -Suspect GI source, consider GI evaluation -Would appreciate ECHO while in-house

## 2017-05-07 NOTE — H&P (Signed)
Date: 05/07/2017               Patient Name:  Peggy House MRN: [id number]  DOB: 04-21-1960 Age / Sex: 57 y.o., female   PCP: Einar Gip, DO         Medical Service: Internal Medicine Teaching Service         Attending Physician: Dr. Evette Doffing, Mallie Mussel, *    First Contact: Dr. Tarri Abernethy Pager: [phone]  Second Contact: Dr. 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 Approximately one week history of bright red blood per rectum and melena, but denies large volume hematochezia. Denies hemoptysis or hematemesis. She has recurrent epistaxis. Her nose bleeds typically occur daily and typically last 30 minutes to one hour. Most recent nose bleed was the day prior to admission, lasted ~ 30 minutes. She has never been evaluated by ENT. She also has been experiencing generalized fatigue and weakness for about one month duration. She denies chest pain, but does experience dyspnea on exertion. No dizziness or lightheadedness. She endorses fever, chills, and intermittent nausea without associated vomiting or abdominal pain for about 1 week duration.   Patient presented to [facility] on 12/26 for cough and CXR, was found to have a hemoglobin of 6.3 at that time, but declined admission. She has been seen by heme/onc specialist Dr. Burr Medico for her chronic anemia. IV iron was recommended, but the patient declined.   The patient also complains of BL LE burning pain and worsening skin darkening for several weeks. Denied any exacerbating or remitting factors, including ambulation or elevation. She does note her legs have been more swollen over the past few  weeks and has subsequently resumed lasix with improvement. She has been taking up to 3 tylenol per day for her leg pain, no NSAID use.    Meds:  Current Meds  Medication Sig  . acetaminophen (TYLENOL) 325 MG tablet Take 650 mg by mouth every 6 (six) hours as needed.  [name] albuterol (PROVENTIL HFA;VENTOLIN HFA) 108 (90 Base) MCG/ACT inhaler Inhale 1-2 puffs into the lungs every 6 (six) hours as needed for wheezing or shortness of breath.  . ALPRAZolam (XANAX) 0.5 MG tablet Take 1 tablet (0.5 mg total) by mouth at bedtime as needed for anxiety or sleep.  [name] amLODipine (NORVASC) 5 MG tablet Take 1 tablet (5 mg total) by mouth daily.  [name] atorvastatin (LIPITOR) 80 MG tablet take 1 tablet by mouth once daily  . ferrous gluconate (FERGON) 324 MG tablet Take 1 tablet (324 mg total) by mouth 3 (three) times daily with meals.  [name] FLUoxetine (PROZAC) 20 MG capsule Take 2 capsules (40 mg total) by mouth daily.  . furosemide (LASIX) 40 MG tablet take 1 tablet by mouth once daily  . lisinopril (PRINIVIL,ZESTRIL) 10 MG tablet Take 1 tablet (10 mg total) daily by mouth.  . metoprolol succinate (TOPROL-XL) 100 MG 24 hr tablet Take 1 tablet (100 mg total) daily by mouth. Take with or immediately following a meal.  . pantoprazole (PROTONIX) 40 MG tablet Take 40 mg by mouth daily.  . traZODone (DESYREL) 50 MG tablet Take 1  tablet (50 mg total) by mouth at bedtime.   Allergies: Allergies as of 05/07/2017 - Review Complete 05/07/2017  Allergen Reaction Noted  . Nsaids Other (See Comments) 04/25/2014  . Tomato Hives 01/01/2012   Past Medical History:  Diagnosis Date  . Anxiety   . Arthritis    knnes,back  . GERD (gastroesophageal reflux disease)   . History of swelling of feet   . Hyperlipidemia   . Hypertension   . Major depressive disorder, recurrent episode ([place]) 06/05/2015  . Obesity   . Snores    Family History:  Mother: + Ovarian cancer, DM, CKD Family: + DM, Stomach cancer, colon cancer, IBD  (Crohn's)  Social History:  Endorses tobacco 1/2 PPD Denies the use of tobacco or illicit substance   Review of Systems: A complete ROS was negative except as per HPI.   Physical Exam: Blood pressure (!) 192/97, pulse 68, temperature 98.9 F (37.2 C), temperature source Oral, resp. rate 19, SpO2 100 %.  General: NAD HENT: [place]/AT, poor dentition, moist mucous membranes; +conjuctical pallor, no scleral icterus Pulm: Normal effort, CTAB  CV: RRR, LUSB 2/6 systolic murmur Abdomen: Soft, non-distended, no TTP, normal bowel sounds  Extremities: Trace peripheral edema of lower extremities, TTP bilaterally, no significant erythema or warmth; DP pulses 2+ Skin: BL hyperpigmentation of ankles, shins with dryness and scaling of right shin>left; soles of feet bilaterally with diffuse hyperkeratotic changes and scaling Neuro: No focal neurological deficits; CN II-XII grossly in tact; strength normal, sensation intact  Assessment & Plan by Problem: Active Problems:   Acute on chronic blood loss anemia (HCC)  Acute on Chronic Anemia. 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 Her baseline hemoglobin is 8-9 and was found to be 5.7 in the clinic. She previously had an endoscopy and colonoscopy in 2016. The endoscopy showed gastric AVMs and ulcers, as well as several tiny vascular ectasias scattered in the stomach and proximal duodenum. The colonoscopy showed no masses, vascular ectasias, diverticula, hemorrhoids, or vascular malformations. She has followed with hematology for her leukocytosis and chronic anemia since 2009. Her chronic anemia is felt to be secondary to recurrent epistaxis and chronic bleeding from her AVMs. Recent iron studies in 10/18 showed an iron sat of 7% and a reticulocyte count of 4.09%. No apparent renal dysfunction to make me suspect this is due to Epo deficiency, patient  is compliant with PO iron.  - Type and screen followed by 2 units pRBCs - CBC in AM  - Repeat BMP in AM - BUN 6, less likely upper GI bleed   - Orthostatic vital signs  - Consult GI in the AM, NPO at midnight    Lower extremity edema. Patient has a history of bilateral lower extremity edema. She is taking p.o. Lasix 40 mg daily and today her lower extremity edema is minimal. Will obtain echocardiogram to evaluate for possible heart failure. Patient did have a sleep study in 2009 and did not meet criteria for OSA. Patient does have skin changes consistent with stasis dermatitis secondary to chronic venous insufficiency and this could explain her LE edema. - Ordered Echocardiogram   Hypertension  - Home regimen includes Amlodipine 5 mg, Lisinopril 10 mg,  And Toprol XL 100mg  BID - Will hold in the meantime   Diet: Regular, NPO at midnight  VTE ppx: SCDs CODE STATUS: Full code  Dispo: Admit patient to Inpatient with expected  length of stay greater than 2 midnights.  Signed: Melanee Spry, MD 05/07/2017, 10:35 PM  My Pager: [phone]

## 2017-05-07 NOTE — Assessment & Plan Note (Signed)
Pressures elevated today at 158/79. She has not taken her Metoprolol, Lisinopril or Amlodipine today as she was rushing to her appointment. BP will need to be re-evaluated at her HFU appt.

## 2017-05-07 NOTE — Progress Notes (Signed)
Peggy House [id number]  Code Status: FULL  Admission Data: 05/07/2017 8:03 PM  Attending Provider: Evette Doffing  HQP:RFFM, Romelle Starcher, DO  Consults/ Treatment Team:   [name] is a 57 y.o. female patient admitted from ED awake, alert - oriented X 3 - no acute distress noted. VSS - Blood pressure (!) 169/82, pulse 78, temperature 99.1 F (37.3 C), temperature source Oral, resp. rate 16, SpO2 100 %. no c/o shortness of breath, no c/o chest pain. Cardiac tele # [phone], in place.   Fall assessment complete, with patient and family able to verbalize understanding of risk associated with falls, and verbalized understanding to call nsg before up out of bed. Call light within reach, patient able to voice, and demonstrate understanding. Skin, clean-dry- intact, with cracking at the heels.  No evidence of skin break down noted on exam.  ?  Will cont to eval and treat per MD orders.  Melonie Florida, RN  05/07/2017 8:03 PM

## 2017-05-08 ENCOUNTER — Inpatient Hospital Stay (HOSPITAL_COMMUNITY): Payer: Medicaid Other

## 2017-05-08 ENCOUNTER — Encounter (HOSPITAL_COMMUNITY): Payer: Self-pay

## 2017-05-08 ENCOUNTER — Other Ambulatory Visit: Payer: Self-pay

## 2017-05-08 DIAGNOSIS — Z9889 Other specified postprocedural states: Secondary | ICD-10-CM

## 2017-05-08 DIAGNOSIS — E669 Obesity, unspecified: Secondary | ICD-10-CM

## 2017-05-08 DIAGNOSIS — D72828 Other elevated white blood cell count: Secondary | ICD-10-CM

## 2017-05-08 DIAGNOSIS — R600 Localized edema: Secondary | ICD-10-CM

## 2017-05-08 DIAGNOSIS — D689 Coagulation defect, unspecified: Secondary | ICD-10-CM

## 2017-05-08 DIAGNOSIS — D649 Anemia, unspecified: Secondary | ICD-10-CM

## 2017-05-08 DIAGNOSIS — D62 Acute posthemorrhagic anemia: Secondary | ICD-10-CM

## 2017-05-08 DIAGNOSIS — K5521 Angiodysplasia of colon with hemorrhage: Secondary | ICD-10-CM

## 2017-05-08 DIAGNOSIS — R011 Cardiac murmur, unspecified: Secondary | ICD-10-CM

## 2017-05-08 DIAGNOSIS — I10 Essential (primary) hypertension: Secondary | ICD-10-CM

## 2017-05-08 LAB — BASIC METABOLIC PANEL
Anion gap: 9 (ref 5–15)
BUN: 6 mg/dL (ref 6–20)
CO2: 24 mmol/L (ref 22–32)
Calcium: 8.8 mg/dL — ABNORMAL LOW (ref 8.9–10.3)
Chloride: 108 mmol/L (ref 101–111)
Creatinine, Ser: 0.8 mg/dL (ref 0.44–1.00)
GFR calc Af Amer: >60 mL/min (ref >60)
GFR calc non Af Amer: >60 mL/min (ref >60)
Glucose, Bld: 103 mg/dL — ABNORMAL HIGH (ref 65–99)
Potassium: 3.6 mmol/L (ref 3.5–5.1)
SODIUM: 141 mmol/L (ref 135–145)

## 2017-05-08 LAB — ECHOCARDIOGRAM COMPLETE
Height: 66 in
WEIGHTICAEL: 4268.8 [oz_av]

## 2017-05-08 LAB — CBC
HCT: 25.7 % — ABNORMAL LOW (ref 36.0–46.0)
Hemoglobin: 7.6 g/dL — ABNORMAL LOW (ref 12.0–15.0)
MCH: 22.4 pg — ABNORMAL LOW (ref 26.0–34.0)
MCHC: 29.6 g/dL — ABNORMAL LOW (ref 30.0–36.0)
MCV: 75.8 fL — ABNORMAL LOW (ref 78.0–100.0)
PLATELETS: 325 10*3/uL (ref 150–400)
RBC: 3.39 MIL/uL — ABNORMAL LOW (ref 3.87–5.11)
RDW: 19.3 % — AB (ref 11.5–15.5)
WBC: 12.1 10*3/uL — AB (ref 4.0–10.5)

## 2017-05-08 LAB — PROTIME-INR
INR: 1.06
Prothrombin Time: 13.8 seconds (ref 11.4–15.2)

## 2017-05-08 LAB — HIV ANTIBODY (ROUTINE TESTING W REFLEX): HIV Screen 4th Generation wRfx: NONREACTIVE

## 2017-05-08 NOTE — Progress Notes (Addendum)
Internal Medicine Clinic Attending  Case discussed with Dr. Molt at the time of the visit.  We reviewed the resident's history and exam and pertinent patient test results.  I agree with the assessment, diagnosis, and plan of care documented in the resident's note. 

## 2017-05-08 NOTE — Progress Notes (Signed)
Pt had nose bleeding.  RN asked pt if she has had this experience in the past and pt responded "yes".  Pt is instructed to pinch the bridge of nose.  Ice pack applied for about five minutes and bleeding stopped.  She is in bed resting.  Will continue to monitor.

## 2017-05-08 NOTE — H&P (View-Only) (Signed)
[facility] Gastroenterology Consult  Referring Provider: Internal Medicine Teaching Service/Dr. Axel Filler Primary Care Physician:  Einar Gip, DO Primary Gastroenterologist: Dr.Buccini  Reason for Consultation: Iron deficiency anemia, hemoglobin 5.7 on admission, history of gastric AVM requiring APC   HPI: Peggy House is a 57 y.o. female was admitted on 05/07/16. She was actually seen for bilateral lower extremity burning pain and discoloration of bilateral lower extremities, but found to have anemia and was subsequently admitted.   Patient is known to have anemia, requiring PO iron supplementation, IV iron infusions and blood transfusions in the past. She reports that she ran out of iron pills last week.  Normally her stools are dark because of iron, recently they have been brown, however yesterday they were dark and maroon-colored.  Patient reports intermittent but recurrent bleeding from nose, for the past 2 months, on a daily basis, varying in amount from 1 tablespoon to large clots. Patient denies swallowing blood during or after epistaxis.  EGD in 2015 which showed multiple gastric AVM, treated with APC. EGD from 2016 showed gastric AVMs, treated with APC. Colonoscopy from 2016 did not show any  AVMs in colon, tubular adenoma was removed from descending colon.  Past Medical History:  Diagnosis Date  . Anxiety   . Arthritis    knnes,back  . GERD (gastroesophageal reflux disease)   . History of swelling of feet   . Hyperlipidemia   . Hypertension   . Major depressive disorder, recurrent episode ([place]) 06/05/2015  . Obesity   . Snores     Past Surgical History:  Procedure Laterality Date  . ABDOMINAL HYSTERECTOMY    . CARPAL TUNNEL RELEASE  05/13/2011   Procedure: CARPAL TUNNEL RELEASE;  Surgeon: Nita Sells, MD;  Location: [place];  Service: Orthopedics;  Laterality: Left;  . COLONOSCOPY WITH PROPOFOL N/A 04/28/2014   Procedure:  COLONOSCOPY WITH PROPOFOL;  Surgeon: Cleotis Nipper, MD;  Location: [facility] ENDOSCOPY;  Service: Endoscopy;  Laterality: N/A;  . DG TOES*L*  2/10   rt  . DILATION AND CURETTAGE OF UTERUS    . ESOPHAGOGASTRODUODENOSCOPY N/A 04/10/2014   Procedure: ESOPHAGOGASTRODUODENOSCOPY (EGD);  Surgeon: [name], MD;  Location: [facility] ENDOSCOPY;  Service: Endoscopy;  Laterality: N/A;  . ESOPHAGOGASTRODUODENOSCOPY (EGD) WITH PROPOFOL N/A 04/27/2014   Procedure: ESOPHAGOGASTRODUODENOSCOPY (EGD) WITH PROPOFOL;  Surgeon: Cleotis Nipper, MD;  Location: [place];  Service: Endoscopy;  Laterality: N/A;  possible apc  . HOT HEMOSTASIS N/A 04/27/2014   Procedure: HOT HEMOSTASIS (ARGON PLASMA COAGULATION/BICAP);  Surgeon: Cleotis Nipper, MD;  Location: [facility] ENDOSCOPY;  Service: Endoscopy;  Laterality: N/A;  . L shoulder Surgery  2011    Prior to Admission medications   Medication Sig Start Date End Date Taking? Authorizing Provider  acetaminophen (TYLENOL) 325 MG tablet Take 650 mg by mouth every 6 (six) hours as needed.   Yes [provider]  albuterol (PROVENTIL HFA;VENTOLIN HFA) 108 (90 Base) MCG/ACT inhaler Inhale 1-2 puffs into the lungs every 6 (six) hours as needed for wheezing or shortness of breath. 04/03/17  Yes Molt, Bethany, DO  ALPRAZolam (XANAX) 0.5 MG tablet Take 1 tablet (0.5 mg total) by mouth at bedtime as needed for anxiety or sleep. 02/04/17  Yes Molt, Bethany, DO  amLODipine (NORVASC) 5 MG tablet Take 1 tablet (5 mg total) by mouth daily. 01/11/17 01/11/18 Yes Molt, Bethany, DO  atorvastatin (LIPITOR) 80 MG tablet take 1 tablet by mouth once daily 01/11/17  Yes Molt, Bethany, DO  ferrous gluconate (FERGON) 324 MG tablet Take 1 tablet (324 mg total) by mouth 3 (three) times daily with meals. 12/25/16 05/07/17 Yes Perlov, Marinell Blight, MD  FLUoxetine (PROZAC) 20 MG capsule Take 2 capsules (40 mg total) by mouth daily. 04/03/17  Yes Molt, Bethany, DO  furosemide (LASIX) 40 MG tablet take 1  tablet by mouth once daily 03/17/17  Yes Molt, Bethany, DO  lisinopril (PRINIVIL,ZESTRIL) 10 MG tablet Take 1 tablet (10 mg total) daily by mouth. 02/25/17  Yes Molt, Bethany, DO  metoprolol succinate (TOPROL-XL) 100 MG 24 hr tablet Take 1 tablet (100 mg total) daily by mouth. Take with or immediately following a meal. 02/25/17 02/25/18 Yes Molt, Bethany, DO  pantoprazole (PROTONIX) 40 MG tablet Take 40 mg by mouth daily.   Yes [provider]  traZODone (DESYREL) 50 MG tablet Take 1 tablet (50 mg total) by mouth at bedtime. 04/03/17  Yes Molt, Bethany, DO  Melatonin 10 MG CAPS Take 10 mg at bedtime by mouth. Patient not taking: Reported on 05/07/2017 02/25/17   Molt, Bethany, DO    Current Facility-Administered Medications  Medication Dose Route Frequency Provider Last Rate Last Dose  . 0.9 %  sodium chloride infusion   Intravenous Once Maryellen Pile, MD      . 0.9 %  sodium chloride infusion  250 mL Intravenous PRN Maryellen Pile, MD      . acetaminophen (TYLENOL) tablet 650 mg  650 mg Oral Q6H PRN Rice, Resa Miner, MD      . ALPRAZolam Duanne Moron) tablet 0.5 mg  0.5 mg Oral QHS PRN Maryellen Pile, MD      . atorvastatin (LIPITOR) tablet 80 mg  80 mg Oral Daily Maryellen Pile, MD   80 mg at 05/08/17 1005  . ferrous sulfate tablet 325 mg  325 mg Oral Q breakfast Maryellen Pile, MD   325 mg at 05/08/17 1005  . FLUoxetine (PROZAC) capsule 40 mg  40 mg Oral Daily Maryellen Pile, MD   40 mg at 05/08/17 1005  . sodium chloride flush (NS) 0.9 % injection 3 mL  3 mL Intravenous Q12H Maryellen Pile, MD      . sodium chloride flush (NS) 0.9 % injection 3 mL  3 mL Intravenous Q12H Maryellen Pile, MD   3 mL at 05/08/17 1008  . sodium chloride flush (NS) 0.9 % injection 3 mL  3 mL Intravenous PRN Maryellen Pile, MD   3 mL at 05/07/17 2144  . traMADol (ULTRAM) tablet 50 mg  50 mg Oral Q6H PRN Collier Salina, MD   50 mg at 05/08/17 0558  . traZODone (DESYREL) tablet 50 mg  50 mg Oral  QHS Maryellen Pile, MD   50 mg at 05/07/17 2154    Allergies as of 05/07/2017 - Review Complete 05/07/2017  Allergen Reaction Noted  . Nsaids Other (See Comments) 04/25/2014  . Tomato Hives 01/01/2012    Family History  Problem Relation Age of Onset  . Cancer Mother        Ovarian  . Ovarian cancer Mother   . Diabetes Mother   . Kidney disease Mother   . Diabetes type II Sister   . Diabetes Sister   . Colon cancer Maternal Grandfather   . Crohn's disease Maternal Grandfather   . Stomach cancer Maternal Grandmother   . Kidney disease Son   . Dysmenorrhea Neg Hx     Social History   Socioeconomic History  . Marital status: Single    Spouse name:  Not on file  . Number of children: Not on file  . Years of education: Not on file  . Highest education level: Not on file  Social Needs  . Financial resource strain: Not on file  . Food insecurity - worry: Not on file  . Food insecurity - inability: Not on file  . Transportation needs - medical: Not on file  . Transportation needs - non-medical: Not on file  Occupational History  . Not on file  Tobacco Use  . Smoking status: Current Every Day Smoker    Packs/day: 0.50    Years: 30.00    Pack years: 15.00    Types: Cigarettes  . Smokeless tobacco: Former Systems developer    Types: Snuff    Quit date: 44  . Tobacco comment: 1/2 PPD  Substance and Sexual Activity  . Alcohol use: No    Alcohol/week: 0.0 oz  . Drug use: No    Comment: last cocaine-2010  . Sexual activity: Yes    Birth control/protection: Surgical    Comment: Hysterectomy  Other Topics Concern  . Not on file  Social History Narrative  . Not on file    Review of Systems: Positive for: GI: Described in detail in HPI.    Gen: anorexia, fatigue, weakness, malaise, Denies any fever, chills, rigors, night sweats, involuntary weight loss, and sleep disorder CV: Denies chest pain, angina, palpitations, syncope, orthopnea, PND, peripheral edema, and  claudication. Resp: Denies dyspnea, cough, sputum, wheezing, coughing up blood. GU : Denies urinary burning, blood in urine, urinary frequency, urinary hesitancy, nocturnal urination, and urinary incontinence. MS: Denies joint pain or swelling.  Denies muscle weakness, cramps, atrophy.  Derm: Denies rash, itching, oral ulcerations, hives, unhealing ulcers.  Psych: Denies depression, anxiety, memory loss, suicidal ideation, hallucinations,  and confusion. Heme: Bleeding from nose, dark maroon stools, Denies bruising and enlarged lymph nodes. Neuro:  Denies any headaches, dizziness, paresthesias. Endo:  Denies any problems with DM, thyroid, adrenal function.  Physical Exam:  Vital signs in last 24 hours: Temp:  [98 F (36.7 C)-99.9 F (37.7 C)] 98.7 F (37.1 C) (01/24 0509) Pulse Rate:  [67-78] 71 (01/24 0509) Resp:  [16-19] 16 (01/24 0509) BP: (158-192)/(73-97) 185/88 (01/24 0509) SpO2:  [98 %-100 %] 100 % (01/24 0509) Weight:  [121 kg (266 lb 12.8 oz)] 121 kg (266 lb 12.8 oz) (01/23 1339) Last BM Date: 05/07/17(pt reported )  General:   Alert,  Well-developed,obese, pleasant and cooperative in NAD Head:  Normocephalic and atraumatic. Eyes:  Obvious pallor Ears:  Normal auditory acuity. Nose:  No deformity, discharge,  or lesions. Mouth:  No deformity or lesions.  Oropharynx pink & moist. Neck:  Supple; no masses or thyromegaly. Lungs:  Clear throughout to auscultation.   No wheezes, crackles, or rhonchi. No acute distress. Heart:  Regular rate and rhythm; no murmurs, clicks, rubs,  or gallops. Extremities: Chronic appearing bilateral lower extremity skin changes Neurologic:  Alert and  oriented x4;  grossly normal neurologically. Skin:  Intact without significant lesions or rashes. Psych:  Alert and cooperative. Normal mood and affect. Abdomen:  Soft, nontender and nondistended. No masses, hepatosplenomegaly or hernias noted. Normal bowel sounds, without guarding, and without  rebound.         Lab Results: Recent Labs    05/07/17 1400 05/08/17 0743  WBC 9.5 12.1*  HGB 5.7* 7.6*  HCT 21.1* 25.7*  PLT 296 325   BMET Recent Labs    05/07/17 1400 05/08/17 0743  NA 140  141  K 3.8 3.6  CL 106 108  CO2 22 24  GLUCOSE 109* 103*  BUN 6 6  CREATININE 0.92 0.80  CALCIUM 8.9 8.8*   LFT Recent Labs    05/07/17 1400  PROT 6.9  ALBUMIN 3.3*  AST 13*  ALT 8*  ALKPHOS 86  BILITOT 0.3   PT/INR Recent Labs    05/08/17 0743  LABPROT 13.8  INR 1.06    Studies/Results: No results found.  Impression: 1. Anemia- recurrent, iron profile from 02/05/17 showed iron saturation 7%, ferritin of 18. Normal BUN/Cr ratio. Hb 5.7 on admission, 2 units PRBC given currently Hb 7.6. 2. Prior gastric AVMs(EGD in 2015/2016, treated with APC) 3. Recurrent epistaxis   Plan: EGD in am, possible chronic low grade bleeding from AVMs. May benefit from [facility] if AVMs noted. Recommend ENT evaluation for recurrent intermittent epistaxis for 2 months. PO Iron and monitoring, iron infusions as an outpatient.   LOS: 1 day   [name], MD  05/08/2017, 12:43 PM  Pager [phone] If no answer or after 5 PM call [phone]

## 2017-05-08 NOTE — Progress Notes (Signed)
   Subjective: Patient continues to feel weak this morning. States that she has been having 1 loose bowel movement a day with mixed bright red blood. She had a similar episode to this approximately 1 year ago at which point she says that she was scoped to hold that it was from the vascular abnormalities in her stomach. We discussed the topic of IV iron and she again declined. We discussed needing to come up with a better treatment plan as an outpatient to try to control her bleeding. She agrees.  She is asking that we contact her GI doctor and see her. We feel that this is appropriate. All questions and concerns addressed.  Objective: Vital signs in last 24 hours: Vitals:   05/07/17 2148 05/08/17 0106 05/08/17 0151 05/08/17 0509  BP: (!) 192/97 (!) 178/80 (!) 163/77 (!) 185/88  Pulse: 68 70 71 71  Resp: 19 16 16 16   Temp: 98.9 F (37.2 C) 99.9 F (37.7 C) 98.9 F (37.2 C) 98.7 F (37.1 C)  TempSrc: Oral Oral Oral Oral  SpO2: 100% 98% 100% 100%   General: Obese female in no acute distress Pulm: Good air movement with no wheezing or crackles CV: Regular rate and rhythm, no murmurs, no rubs Abdomen: Active bowel sounds, soft, nondistended, no tenderness palpation Extremities: Trace lower extremity edema. Lichenification of the bilateral lateral lower extremities  Assessment/Plan:  Peggy House is a 57 year old female with chronic anemia presented to the internal medicine residency clinic with weakness and bright red blood per rectum. Her baseline hemoglobin is 8-9 and was found to be 5.7 in the clinic. Continues to feel weak but states that she has not had a bowel movement since he been here. Further management as outlined below.   Acute on Chronic Anemia  - Presented with weakness and bright red blood per rectum  - Gastric AVMs and ulcers noted on Endoscopy in 2016  - Hemoglobin 5.7 down from a baseline of 8-9. Received 2 units pRBCs hemoglobin is now 7.6  - PT and PTT within normal  limits - Consult GI today to evaluate the need for a scope  - Discussed the importance of outpatient management with iron. Continues to decline IV iron.   Lower Extremity Edema  - Heart failure vs stasis dermatitis  - Echocardiogram pending   Hypertension  - Holding home regimen including Amlodipine 5 mg, Lisinopril 10 mg, and metoprolol succinate 100 mg BID  Leukocytosis  - Chronic. Follows with hematology for both her anemia and leukocytosis.  - No signs of infection at this point.  - Continue to monitor   Dispo: Anticipated discharge in approximately 1-2 day(s).   Ina Homes, MD 05/08/2017, 6:05 AM My Pager: [phone]

## 2017-05-08 NOTE — Progress Notes (Signed)
  Echocardiogram 2D Echocardiogram has been performed.  Darlina Sicilian M 05/08/2017, 2:11 PM

## 2017-05-08 NOTE — Consult Note (Signed)
[facility] Gastroenterology Consult  Referring Provider: Internal Medicine Teaching Service/Dr. Axel Filler Primary Care Physician:  Einar Gip, DO Primary Gastroenterologist: Dr.Buccini  Reason for Consultation: Iron deficiency anemia, hemoglobin 5.7 on admission, history of gastric AVM requiring APC   HPI: Peggy House is a 57 y.o. female was admitted on 05/07/16. She was actually seen for bilateral lower extremity burning pain and discoloration of bilateral lower extremities, but found to have anemia and was subsequently admitted.   Patient is known to have anemia, requiring PO iron supplementation, IV iron infusions and blood transfusions in the past. She reports that she ran out of iron pills last week.  Normally her stools are dark because of iron, recently they have been brown, however yesterday they were dark and maroon-colored.  Patient reports intermittent but recurrent bleeding from nose, for the past 2 months, on a daily basis, varying in amount from 1 tablespoon to large clots. Patient denies swallowing blood during or after epistaxis.  EGD in 2015 which showed multiple gastric AVM, treated with APC. EGD from 2016 showed gastric AVMs, treated with APC. Colonoscopy from 2016 did not show any  AVMs in colon, tubular adenoma was removed from descending colon.  Past Medical History:  Diagnosis Date  . Anxiety   . Arthritis    knnes,back  . GERD (gastroesophageal reflux disease)   . History of swelling of feet   . Hyperlipidemia   . Hypertension   . Major depressive disorder, recurrent episode ([place]) 06/05/2015  . Obesity   . Snores     Past Surgical History:  Procedure Laterality Date  . ABDOMINAL HYSTERECTOMY    . CARPAL TUNNEL RELEASE  05/13/2011   Procedure: CARPAL TUNNEL RELEASE;  Surgeon: Nita Sells, MD;  Location: [place];  Service: Orthopedics;  Laterality: Left;  . COLONOSCOPY WITH PROPOFOL N/A 04/28/2014   Procedure:  COLONOSCOPY WITH PROPOFOL;  Surgeon: Cleotis Nipper, MD;  Location: [facility] ENDOSCOPY;  Service: Endoscopy;  Laterality: N/A;  . DG TOES*L*  2/10   rt  . DILATION AND CURETTAGE OF UTERUS    . ESOPHAGOGASTRODUODENOSCOPY N/A 04/10/2014   Procedure: ESOPHAGOGASTRODUODENOSCOPY (EGD);  Surgeon: [name], MD;  Location: [facility] ENDOSCOPY;  Service: Endoscopy;  Laterality: N/A;  . ESOPHAGOGASTRODUODENOSCOPY (EGD) WITH PROPOFOL N/A 04/27/2014   Procedure: ESOPHAGOGASTRODUODENOSCOPY (EGD) WITH PROPOFOL;  Surgeon: Cleotis Nipper, MD;  Location: [place];  Service: Endoscopy;  Laterality: N/A;  possible apc  . HOT HEMOSTASIS N/A 04/27/2014   Procedure: HOT HEMOSTASIS (ARGON PLASMA COAGULATION/BICAP);  Surgeon: Cleotis Nipper, MD;  Location: [facility] ENDOSCOPY;  Service: Endoscopy;  Laterality: N/A;  . L shoulder Surgery  2011    Prior to Admission medications   Medication Sig Start Date End Date Taking? Authorizing Provider  acetaminophen (TYLENOL) 325 MG tablet Take 650 mg by mouth every 6 (six) hours as needed.   Yes [provider]  albuterol (PROVENTIL HFA;VENTOLIN HFA) 108 (90 Base) MCG/ACT inhaler Inhale 1-2 puffs into the lungs every 6 (six) hours as needed for wheezing or shortness of breath. 04/03/17  Yes Molt, Bethany, DO  ALPRAZolam (XANAX) 0.5 MG tablet Take 1 tablet (0.5 mg total) by mouth at bedtime as needed for anxiety or sleep. 02/04/17  Yes Molt, Bethany, DO  amLODipine (NORVASC) 5 MG tablet Take 1 tablet (5 mg total) by mouth daily. 01/11/17 01/11/18 Yes Molt, Bethany, DO  atorvastatin (LIPITOR) 80 MG tablet take 1 tablet by mouth once daily 01/11/17  Yes Molt, Bethany, DO  ferrous gluconate (FERGON) 324 MG tablet Take 1 tablet (324 mg total) by mouth 3 (three) times daily with meals. 12/25/16 05/07/17 Yes Perlov, Marinell Blight, MD  FLUoxetine (PROZAC) 20 MG capsule Take 2 capsules (40 mg total) by mouth daily. 04/03/17  Yes Molt, Bethany, DO  furosemide (LASIX) 40 MG tablet take 1  tablet by mouth once daily 03/17/17  Yes Molt, Bethany, DO  lisinopril (PRINIVIL,ZESTRIL) 10 MG tablet Take 1 tablet (10 mg total) daily by mouth. 02/25/17  Yes Molt, Bethany, DO  metoprolol succinate (TOPROL-XL) 100 MG 24 hr tablet Take 1 tablet (100 mg total) daily by mouth. Take with or immediately following a meal. 02/25/17 02/25/18 Yes Molt, Bethany, DO  pantoprazole (PROTONIX) 40 MG tablet Take 40 mg by mouth daily.   Yes [provider]  traZODone (DESYREL) 50 MG tablet Take 1 tablet (50 mg total) by mouth at bedtime. 04/03/17  Yes Molt, Bethany, DO  Melatonin 10 MG CAPS Take 10 mg at bedtime by mouth. Patient not taking: Reported on 05/07/2017 02/25/17   Molt, Bethany, DO    Current Facility-Administered Medications  Medication Dose Route Frequency Provider Last Rate Last Dose  . 0.9 %  sodium chloride infusion   Intravenous Once Maryellen Pile, MD      . 0.9 %  sodium chloride infusion  250 mL Intravenous PRN Maryellen Pile, MD      . acetaminophen (TYLENOL) tablet 650 mg  650 mg Oral Q6H PRN Rice, Resa Miner, MD      . ALPRAZolam Duanne Moron) tablet 0.5 mg  0.5 mg Oral QHS PRN Maryellen Pile, MD      . atorvastatin (LIPITOR) tablet 80 mg  80 mg Oral Daily Maryellen Pile, MD   80 mg at 05/08/17 1005  . ferrous sulfate tablet 325 mg  325 mg Oral Q breakfast Maryellen Pile, MD   325 mg at 05/08/17 1005  . FLUoxetine (PROZAC) capsule 40 mg  40 mg Oral Daily Maryellen Pile, MD   40 mg at 05/08/17 1005  . sodium chloride flush (NS) 0.9 % injection 3 mL  3 mL Intravenous Q12H Maryellen Pile, MD      . sodium chloride flush (NS) 0.9 % injection 3 mL  3 mL Intravenous Q12H Maryellen Pile, MD   3 mL at 05/08/17 1008  . sodium chloride flush (NS) 0.9 % injection 3 mL  3 mL Intravenous PRN Maryellen Pile, MD   3 mL at 05/07/17 2144  . traMADol (ULTRAM) tablet 50 mg  50 mg Oral Q6H PRN Collier Salina, MD   50 mg at 05/08/17 0558  . traZODone (DESYREL) tablet 50 mg  50 mg Oral  QHS Maryellen Pile, MD   50 mg at 05/07/17 2154    Allergies as of 05/07/2017 - Review Complete 05/07/2017  Allergen Reaction Noted  . Nsaids Other (See Comments) 04/25/2014  . Tomato Hives 01/01/2012    Family History  Problem Relation Age of Onset  . Cancer Mother        Ovarian  . Ovarian cancer Mother   . Diabetes Mother   . Kidney disease Mother   . Diabetes type II Sister   . Diabetes Sister   . Colon cancer Maternal Grandfather   . Crohn's disease Maternal Grandfather   . Stomach cancer Maternal Grandmother   . Kidney disease Son   . Dysmenorrhea Neg Hx     Social History   Socioeconomic History  . Marital status: Single    Spouse name:  Not on file  . Number of children: Not on file  . Years of education: Not on file  . Highest education level: Not on file  Social Needs  . Financial resource strain: Not on file  . Food insecurity - worry: Not on file  . Food insecurity - inability: Not on file  . Transportation needs - medical: Not on file  . Transportation needs - non-medical: Not on file  Occupational History  . Not on file  Tobacco Use  . Smoking status: Current Every Day Smoker    Packs/day: 0.50    Years: 30.00    Pack years: 15.00    Types: Cigarettes  . Smokeless tobacco: Former Systems developer    Types: Snuff    Quit date: 93  . Tobacco comment: 1/2 PPD  Substance and Sexual Activity  . Alcohol use: No    Alcohol/week: 0.0 oz  . Drug use: No    Comment: last cocaine-2010  . Sexual activity: Yes    Birth control/protection: Surgical    Comment: Hysterectomy  Other Topics Concern  . Not on file  Social History Narrative  . Not on file    Review of Systems: Positive for: GI: Described in detail in HPI.    Gen: anorexia, fatigue, weakness, malaise, Denies any fever, chills, rigors, night sweats, involuntary weight loss, and sleep disorder CV: Denies chest pain, angina, palpitations, syncope, orthopnea, PND, peripheral edema, and  claudication. Resp: Denies dyspnea, cough, sputum, wheezing, coughing up blood. GU : Denies urinary burning, blood in urine, urinary frequency, urinary hesitancy, nocturnal urination, and urinary incontinence. MS: Denies joint pain or swelling.  Denies muscle weakness, cramps, atrophy.  Derm: Denies rash, itching, oral ulcerations, hives, unhealing ulcers.  Psych: Denies depression, anxiety, memory loss, suicidal ideation, hallucinations,  and confusion. Heme: Bleeding from nose, dark maroon stools, Denies bruising and enlarged lymph nodes. Neuro:  Denies any headaches, dizziness, paresthesias. Endo:  Denies any problems with DM, thyroid, adrenal function.  Physical Exam:  Vital signs in last 24 hours: Temp:  [98 F (36.7 C)-99.9 F (37.7 C)] 98.7 F (37.1 C) (01/24 0509) Pulse Rate:  [67-78] 71 (01/24 0509) Resp:  [16-19] 16 (01/24 0509) BP: (158-192)/(73-97) 185/88 (01/24 0509) SpO2:  [98 %-100 %] 100 % (01/24 0509) Weight:  [121 kg (266 lb 12.8 oz)] 121 kg (266 lb 12.8 oz) (01/23 1339) Last BM Date: 05/07/17(pt reported )  General:   Alert,  Well-developed,obese, pleasant and cooperative in NAD Head:  Normocephalic and atraumatic. Eyes:  Obvious pallor Ears:  Normal auditory acuity. Nose:  No deformity, discharge,  or lesions. Mouth:  No deformity or lesions.  Oropharynx pink & moist. Neck:  Supple; no masses or thyromegaly. Lungs:  Clear throughout to auscultation.   No wheezes, crackles, or rhonchi. No acute distress. Heart:  Regular rate and rhythm; no murmurs, clicks, rubs,  or gallops. Extremities: Chronic appearing bilateral lower extremity skin changes Neurologic:  Alert and  oriented x4;  grossly normal neurologically. Skin:  Intact without significant lesions or rashes. Psych:  Alert and cooperative. Normal mood and affect. Abdomen:  Soft, nontender and nondistended. No masses, hepatosplenomegaly or hernias noted. Normal bowel sounds, without guarding, and without  rebound.         Lab Results: Recent Labs    05/07/17 1400 05/08/17 0743  WBC 9.5 12.1*  HGB 5.7* 7.6*  HCT 21.1* 25.7*  PLT 296 325   BMET Recent Labs    05/07/17 1400 05/08/17 0743  NA 140  141  K 3.8 3.6  CL 106 108  CO2 22 24  GLUCOSE 109* 103*  BUN 6 6  CREATININE 0.92 0.80  CALCIUM 8.9 8.8*   LFT Recent Labs    05/07/17 1400  PROT 6.9  ALBUMIN 3.3*  AST 13*  ALT 8*  ALKPHOS 86  BILITOT 0.3   PT/INR Recent Labs    05/08/17 0743  LABPROT 13.8  INR 1.06    Studies/Results: No results found.  Impression: 1. Anemia- recurrent, iron profile from 02/05/17 showed iron saturation 7%, ferritin of 18. Normal BUN/Cr ratio. Hb 5.7 on admission, 2 units PRBC given currently Hb 7.6. 2. Prior gastric AVMs(EGD in 2015/2016, treated with APC) 3. Recurrent epistaxis   Plan: EGD in am, possible chronic low grade bleeding from AVMs. May benefit from [facility] if AVMs noted. Recommend ENT evaluation for recurrent intermittent epistaxis for 2 months. PO Iron and monitoring, iron infusions as an outpatient.   LOS: 1 day   [name], MD  05/08/2017, 12:43 PM  Pager [phone] If no answer or after 5 PM call [phone]

## 2017-05-08 NOTE — Progress Notes (Signed)
Pt. Is NPO.  She complaint of pain; PO med is ordered.  MD notified and an order to give med with sips of water received.  BP is 185/88.  MD notified . No new order received.  Will continue to monitor.

## 2017-05-09 ENCOUNTER — Encounter (HOSPITAL_COMMUNITY): Payer: Self-pay | Admitting: Internal Medicine

## 2017-05-09 ENCOUNTER — Inpatient Hospital Stay (HOSPITAL_COMMUNITY): Payer: Medicaid Other | Admitting: Anesthesiology

## 2017-05-09 ENCOUNTER — Encounter (HOSPITAL_COMMUNITY)
Admission: AD | Disposition: A | Discharge status: Home / Self Care | Payer: Self-pay | Source: Ambulatory Visit | Attending: Student in an Organized Health Care Education/Training Program

## 2017-05-09 ENCOUNTER — Telehealth: Payer: Self-pay

## 2017-05-09 DIAGNOSIS — K5520 Angiodysplasia of colon without hemorrhage: Secondary | ICD-10-CM

## 2017-05-09 DIAGNOSIS — I509 Heart failure, unspecified: Secondary | ICD-10-CM

## 2017-05-09 DIAGNOSIS — I080 Rheumatic disorders of both mitral and aortic valves: Secondary | ICD-10-CM

## 2017-05-09 DIAGNOSIS — I110 Hypertensive heart disease with heart failure: Secondary | ICD-10-CM

## 2017-05-09 DIAGNOSIS — K259 Gastric ulcer, unspecified as acute or chronic, without hemorrhage or perforation: Secondary | ICD-10-CM

## 2017-05-09 DIAGNOSIS — D72829 Elevated white blood cell count, unspecified: Secondary | ICD-10-CM

## 2017-05-09 LAB — BPAM RBC
BLOOD PRODUCT EXPIRATION DATE: 201902062359
Blood Product Expiration Date: 201901252359
Blood Product Expiration Date: 201902042359
ISSUE DATE / TIME: 201901240110
ISSUE DATE / TIME: 201901240721
ISSUE DATE / TIME: 201901232120
UNIT TYPE AND RH: 600
UNIT TYPE AND RH: 600
Unit Type and Rh: 600

## 2017-05-09 LAB — CBC
HCT: 27.2 % — ABNORMAL LOW (ref 36.0–46.0)
Hemoglobin: 7.8 g/dL — ABNORMAL LOW (ref 12.0–15.0)
MCH: 21.5 pg — ABNORMAL LOW (ref 26.0–34.0)
MCHC: 28.7 g/dL — AB (ref 30.0–36.0)
MCV: 74.9 fL — ABNORMAL LOW (ref 78.0–100.0)
PLATELETS: 363 10*3/uL (ref 150–400)
RBC: 3.63 MIL/uL — ABNORMAL LOW (ref 3.87–5.11)
RDW: 19.7 % — ABNORMAL HIGH (ref 11.5–15.5)
WBC: 13.7 10*3/uL — AB (ref 4.0–10.5)

## 2017-05-09 LAB — TYPE AND SCREEN
ABO/RH(D): A NEG
Antibody Screen: NEGATIVE
Unit division: 0
Unit division: 0
Unit division: 0

## 2017-05-09 SURGERY — CANCELLED PROCEDURE

## 2017-05-09 MED ORDER — METOPROLOL SUCCINATE ER 100 MG PO TB24
100.0000 mg | ORAL_TABLET | Freq: Every day | ORAL | Status: DC
  Administered 2017-05-09 – 2017-05-10 (×2): 100 mg via ORAL
  Filled 2017-05-09 (×3): qty 1

## 2017-05-09 MED ORDER — LISINOPRIL 10 MG PO TABS
10.0000 mg | ORAL_TABLET | Freq: Every day | ORAL | Status: DC
  Administered 2017-05-09 – 2017-05-11 (×3): 10 mg via ORAL
  Filled 2017-05-09 (×3): qty 1

## 2017-05-09 MED ORDER — AMLODIPINE BESYLATE 5 MG PO TABS
5.0000 mg | ORAL_TABLET | Freq: Every day | ORAL | Status: DC
  Administered 2017-05-09 – 2017-05-11 (×3): 5 mg via ORAL
  Filled 2017-05-09 (×3): qty 1

## 2017-05-09 MED ORDER — SODIUM CHLORIDE 0.9 % IV SOLN: INTRAVENOUS | Status: DC

## 2017-05-09 MED ORDER — HYDRALAZINE HCL 20 MG/ML IJ SOLN
2.0000 mg | INTRAMUSCULAR | Status: DC | PRN
  Filled 2017-05-09 (×2): qty 1

## 2017-05-09 SURGICAL SUPPLY — 15 items

## 2017-05-09 NOTE — Discharge Summary (Signed)
Name: Peggy House MRN: [id number] DOB: Aug 30, 1960 57 y.o. PCP: Einar Gip, DO  Date of Admission: 05/07/2017  6:24 PM Date of Discharge: 05/11/2017 Attending Physician: Axel Filler, *  Discharge Diagnosis: 1. Hereditary Hemorrhagic Telangiectasia 2. Lower Extremity Edema  Principal Problem:   Hereditary hemorrhagic telangiectasia (HCC) Active Problems:   Acute on chronic blood loss anemia (HCC)  Discharge Medications: Allergies as of 05/11/2017      Reactions   Nsaids Other (See Comments)   Stomach bleeding episodes; Tylenol is OK   Tomato Hives      Medication List    TAKE these medications   acetaminophen 325 MG tablet Commonly known as:  TYLENOL Take 650 mg by mouth every 6 (six) hours as needed.   albuterol 108 (90 Base) MCG/ACT inhaler Commonly known as:  PROVENTIL HFA;VENTOLIN HFA Inhale 1-2 puffs into the lungs every 6 (six) hours as needed for wheezing or shortness of breath.   ALPRAZolam 0.5 MG tablet Commonly known as:  XANAX Take 1 tablet (0.5 mg total) by mouth at bedtime as needed for anxiety or sleep.   amLODipine 5 MG tablet Commonly known as:  NORVASC Take 1 tablet (5 mg total) by mouth daily.   atorvastatin 80 MG tablet Commonly known as:  LIPITOR take 1 tablet by mouth once daily   cephALEXin 500 MG capsule Commonly known as:  KEFLEX Take 1 capsule (500 mg total) by mouth every 8 (eight) hours for 14 days.   ferrous gluconate 324 MG tablet Commonly known as:  FERGON Take 1 tablet (324 mg total) by mouth 3 (three) times daily with meals.   FLUoxetine 20 MG capsule Commonly known as:  PROZAC Take 2 capsules (40 mg total) by mouth daily.   furosemide 40 MG tablet Commonly known as:  LASIX take 1 tablet by mouth once daily   lisinopril 10 MG tablet Commonly known as:  PRINIVIL,ZESTRIL Take 1 tablet (10 mg total) daily by mouth.   Melatonin 10 MG Caps Take 10 mg at bedtime by mouth.   metoprolol succinate 100 MG 24  hr tablet Commonly known as:  TOPROL-XL Take 1 tablet (100 mg total) daily by mouth. Take with or immediately following a meal.   pantoprazole 40 MG tablet Commonly known as:  PROTONIX Take 40 mg by mouth daily.   traZODone 50 MG tablet Commonly known as:  DESYREL Take 1 tablet (50 mg total) by mouth at bedtime.     Disposition and follow-up:   Peggy House was discharged from [facility] in Stable condition.  At the hospital follow up visit please address:  1.  Hereditary Hemorrhagic Telangiectasia. Discuss with hematology about starting tranexamic acid. Again discuss with the patient the option of IV iron.  Lower extremity edema. Discussed with her the patient was able to obtain her compression stockings.  2.  Labs / imaging needed at time of follow-up: None  3.  Pending labs/ test needing follow-up: None  Follow-up Appointments: Follow-up Information    Molt, Bethany, DO Follow up on 05/20/2017.   Specialty:  Internal Medicine Why:  @ 2:45pm Contact information: [place] [address] [phone]        Leta Baptist, MD. Schedule an appointment as soon as possible for a visit.   Specialty:  Otolaryngology Contact information: [address] [place] [address] [phone]         Hospital Course by problem list: Principal Problem:  Hereditary hemorrhagic telangiectasia (HCC) Active Problems:   Acute on chronic blood loss anemia (HCC)   Hereditary Hemorrhagic Telangiectasia. 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 Her baseline hemoglobin is 8-9 and was found to be 5.7 in the clinic. She previously had an endoscopy and colonoscopy in 2016. The endoscopy showed gastric AVMs and ulcers, as well as several tiny vascular ectasias scattered in the stomach and proximal duodenum. Patient was transfused 2 units of  packed red blood cells and her hemoglobin stabilized between 7-8. GI was consulted who subsequently took the patient for EGD illustrating AMVs in the stomach that were treated acutely with argon plasma coagulation. ENT was also consulted who evaluated the patient using nasolaryngoscopy and found the patient had multiple hypervascular areas on the left superior and posterior nasal septum. A Merocel packing was placed and the patient was started on Keflex. She will follow-up with ENT Benjamine Mola, Kasandra Knudsen, MD). After her nasal packing is removed she can due a trial of topical tranexamic acid. She will follow-up with Dr. Danford Bad in the [facility] on 05/20/17.   Lower Extremity Edema. Patient presented with complaints of lower extremity pain and swelling. Physical exam showed skin changes consistent with stasis dermatitis secondary to chronic venous insufficiency.  However echocardiogram was obtained that showed left ventricular ejection fraction of 55-60%, aortic and mitral regurgitation, left atrial enlargement, right atrial, right ventricular dilation, and elevated pulmonary pressures. These findings are indicative of possible HFpEF. She was discharged with compression stockings and her normal amount of Lasix (40 mg once daily).  Discharge Vitals:   BP (!) 122/95 (BP Location: Left Arm)   Pulse (!) 52   Temp 98.6 F (37 C)   Resp 18   Ht 5\' 6"  (1.676 m)   Wt 266 lb (120.7 kg)   SpO2 100%   BMI 42.93 kg/m   Pertinent Labs, Studies, and Procedures:   ECHOCARDIOGRAM  - Left ventricle: The cavity size was normal. Wall thickness was   increased in a pattern of mild LVH. Systolic function was normal.   The estimated ejection fraction was in the range of 55% to 60%.   Wall motion was normal; there were no regional wall motion   abnormalities. - Aortic valve: There was mild regurgitation. - Mitral valve: There was mild regurgitation. - Left atrium: The atrium was moderately dilated. - Right ventricle: The cavity size  was mildly dilated. - Right atrium: The atrium was mildly dilated. - Pulmonary arteries: Systolic pressure was moderately increased.   PA peak pressure: 51 mm Hg (S).  Discharge Instructions: Discharge Instructions    Compression stockings   Complete by:  As directed    Diet - low sodium heart healthy   Complete by:  As directed    Increase activity slowly   Complete by:  As directed      Signed: Ina Homes, MD 05/11/2017, 12:24 PM   My Pager: [phone]

## 2017-05-09 NOTE — Telephone Encounter (Signed)
Remains inpt, unable to call

## 2017-05-09 NOTE — Interval H&P Note (Signed)
History and Physical Interval Note:  05/09/2017 12:12 PM  Peggy House  has presented today for surgery, with the diagnosis of Melena, anemia, history of gastric AVM  The various methods of treatment have been discussed with the patient and family. After consideration of risks, benefits and other options for treatment, the patient has consented to  Procedure(s): ESOPHAGOGASTRODUODENOSCOPY (EGD) WITH PROPOFOL (N/A) as a surgical intervention .  The patient's history has been reviewed, patient examined, no change in status, stable for surgery.  I have reviewed the patient's chart and labs.  Questions were answered to the patient's satisfaction.     Landry Dyke

## 2017-05-09 NOTE — Progress Notes (Signed)
   Subjective: Patient is doing well this morning. States that she has not had any further bowel movements since admission. She does have a headache this morning, but feels that is related to her high blood pressure. We discussed restarting her blood pressure medicines today. We discussed that she will go for an EGD today, and depending on how she feels after she may be able to go home today. She is comfortable with the plan. All questions and concerns addressed.  Objective: Vital signs in last 24 hours: Vitals:   05/08/17 1430 05/08/17 1849 05/08/17 2215 05/09/17 0423  BP:   (!) 190/83 (!) 172/94  Pulse: 68  67   Resp: 18  18 18   Temp: 98.5 F (36.9 C)  (!) 97.5 F (36.4 C) 98.3 F (36.8 C)  TempSrc: Oral  Oral Oral  SpO2: 100%  99%   Weight:  266 lb (120.7 kg)    Height:  5\' 6"  (1.676 m)     General: Obese female in no acute distress Pulm: Good air movement with no wheezing or crackles CV: Regular rate and rhythm, no murmurs, no rubs Abdomen: Active bowel sounds, soft, nondistended, no tenderness to palpation Extremities: Tenderness to palpation of the lower extremities with trace pitting edema bilaterally  Assessment/Plan:  Adamae Ricklefs a 57 year old female with chronic anemia presented to the internal medicine residency clinic with weakness and bright red blood per rectum. Her baseline hemoglobin is8-9 and was foundto be5.7in the clinic. Feeling well this morning and planning for EGD today. Further management as outlined below.   Acute on Chronic Anemia  - Presented with weakness and bright red blood per rectum  - Gastric AVMs and ulcers noted on Endoscopy in 2016  - Hemoglobin at 7.8 - Echocardiogram showing no aortic stenosis  - GI to take patient for EGD today - Checking von Willebrand's  - Patient's clinical features are consistent with hereditary hemorrhagic telangiectasia  Lower Extremity Edema  - Echocardiogram showing LVEF of 55-60%, aortic and mitral  regurgitation, LAE, RAE, right ventricle dilation, and elevated pulmonary pressures.  - Lower extremities edema is likely due to both venous insufficiency and heart failure   Hypertension  - Amlodipine 5 mg, Lisinopril 10 mg, and metoprolol succinate 100 mg BID  Leukocytosis  - Chronic. Follows with hematology for both her anemia and leukocytosis.  - No signs of infection at this point.  - Continue to monitor   Dispo: Anticipated discharge in approximately 0-1 day(s).   Ina Homes, MD 05/09/2017, 6:05 AM My Pager: [phone]

## 2017-05-09 NOTE — Telephone Encounter (Signed)
Hospital TOC per Dr Tarri Abernethy discharge: will be over the weekend, appt 05/20/2017.

## 2017-05-09 NOTE — Progress Notes (Signed)
Subjective: Headache in endoscopy. No abdominal pain. No melena for past few days.  Objective: Vital signs in last 24 hours: Temp:  [97.5 F (36.4 C)-98.5 F (36.9 C)] 98.3 F (36.8 C) (01/25 0423) Pulse Rate:  [60-68] 60 (01/25 1110) Resp:  [15-18] 15 (01/25 1110) BP: (172-190)/(83-94) 172/94 (01/25 0423) SpO2:  [99 %-100 %] 99 % (01/25 1110) Weight:  [266 lb (120.7 kg)] 266 lb (120.7 kg) (01/24 1849) Weight change:  Last BM Date: 05/07/17  PE: GEN:  Overweight ABD:  Soft, protuberant, non-tender  Lab Results: CBC    Component Value Date/Time   WBC 13.7 (H) 05/09/2017 0454   RBC 3.63 (L) 05/09/2017 0454   HGB 7.8 (L) 05/09/2017 0454   HGB 8.1 (L) 02/05/2017 1407   HCT 27.2 (L) 05/09/2017 0454   HCT 26.6 (L) 02/05/2017 1407   PLT 363 05/09/2017 0454   PLT 346 02/05/2017 1407   PLT 350 10/29/2016 1419   MCV 74.9 (L) 05/09/2017 0454   MCV 82.8 02/05/2017 1407   MCH 21.5 (L) 05/09/2017 0454   MCHC 28.7 (L) 05/09/2017 0454   RDW 19.7 (H) 05/09/2017 0454   RDW 22.6 (H) 02/05/2017 1407   LYMPHSABS 2.0 05/07/2017 1400   LYMPHSABS 2.6 02/05/2017 1407   MONOABS 0.3 05/07/2017 1400   MONOABS 0.5 02/05/2017 1407   EOSABS 0.1 05/07/2017 1400   EOSABS 0.1 02/05/2017 1407   EOSABS 0.2 10/29/2016 1419   BASOSABS 0.0 05/07/2017 1400   BASOSABS 0.1 02/05/2017 1407   Assessment:  1.  Anemia, blood loss. 2.  Melena.  None for past couple days. 3.  Hypertension, poorly controlled.  Systolic pressure 099I, not controlled with labetalol.  Plan:  1.  I have spoken with Dr. Lissa Hoard of anesthesia.  Patient's blood pressure is not adequately controlled for patient to have non-emergency procedure. 2.  Endoscopy will be cancelled.  Patient will be seen tomorrow by [facility] and we can then assess for time to reschedule the procedure once the patient's blood pressure is better controlled. 3.  I have reached out to primary team via pager.  Peggy House 05/09/2017, 12:23  PM   Cell [phone] If no answer or after 5 PM call [phone]

## 2017-05-09 NOTE — Anesthesia Preprocedure Evaluation (Deleted)
Anesthesia Evaluation  Patient identified by MRN, date of birth, ID band Patient awake    Reviewed: Allergy & Precautions, NPO status , Patient's Chart, lab work & pertinent test results, reviewed documented beta blocker date and time   History of Anesthesia Complications Negative for: history of anesthetic complications  Airway Mallampati: III  TM Distance: >3 FB Neck ROM: Full    Dental  (+) Teeth Intact, Missing, Dental Advisory Given, Partial Lower, Partial Upper,    Pulmonary neg COPD, Recent URI , Current Smoker,    breath sounds clear to auscultation       Cardiovascular hypertension, Pt. on medications and Pt. on home beta blockers (-) angina(-) Past MI and (-) CHF (-) dysrhythmias + Valvular Problems/Murmurs AI and MR  Rhythm:Regular     Neuro/Psych PSYCHIATRIC DISORDERS Anxiety Depression negative neurological ROS     GI/Hepatic Neg liver ROS, GERD  Medicated and Controlled,  Endo/Other  Morbid obesity  Renal/GU negative Renal ROS     Musculoskeletal  (+) Arthritis ,   Abdominal   Peds  Hematology  (+) anemia ,   Anesthesia Other Findings BP elevated on arrival after not receiving daily meds, endorses HA, denies CP, SOB or other issues, BP treated and symptoms resolved prior to procedure  Reproductive/Obstetrics                            Anesthesia Physical  Anesthesia Plan  ASA: III  Anesthesia Plan: MAC   Post-op Pain Management:    Induction: Intravenous  PONV Risk Score and Plan: 1 and Treatment may vary due to age or medical condition  Airway Management Planned: Simple Face Mask and Natural Airway  Additional Equipment: None  Intra-op Plan:   Post-operative Plan:   Informed Consent: I have reviewed the patients History and Physical, chart, labs and discussed the procedure including the risks, benefits and alternatives for the proposed anesthesia with the patient  or authorized representative who has indicated his/her understanding and acceptance.   Dental advisory given  Plan Discussed with: CRNA  Anesthesia Plan Comments:        Anesthesia Quick Evaluation

## 2017-05-10 ENCOUNTER — Inpatient Hospital Stay (HOSPITAL_COMMUNITY): Payer: Medicaid Other | Admitting: Anesthesiology

## 2017-05-10 ENCOUNTER — Encounter (HOSPITAL_COMMUNITY): Payer: Self-pay | Admitting: *Deleted

## 2017-05-10 ENCOUNTER — Encounter (HOSPITAL_COMMUNITY)
Admission: AD | Disposition: A | Discharge status: Home / Self Care | Payer: Self-pay | Source: Ambulatory Visit | Attending: Student in an Organized Health Care Education/Training Program

## 2017-05-10 HISTORY — PX: ESOPHAGOGASTRODUODENOSCOPY: SHX5428

## 2017-05-10 LAB — VON WILLEBRAND PANEL
COAGULATION FACTOR VIII: 238 % — AB (ref 57–163)
RISTOCETIN CO-FACTOR, PLASMA: 136 % (ref 50–200)
Von Willebrand Antigen, Plasma: 162 % (ref 50–200)

## 2017-05-10 LAB — BASIC METABOLIC PANEL
Anion gap: 9 (ref 5–15)
BUN: 6 mg/dL (ref 6–20)
CALCIUM: 9.1 mg/dL (ref 8.9–10.3)
CO2: 22 mmol/L (ref 22–32)
CREATININE: 0.89 mg/dL (ref 0.44–1.00)
Chloride: 107 mmol/L (ref 101–111)
GFR calc non Af Amer: >60 mL/min (ref >60)
GLUCOSE: 98 mg/dL (ref 65–99)
Potassium: 3.7 mmol/L (ref 3.5–5.1)
Sodium: 138 mmol/L (ref 135–145)

## 2017-05-10 LAB — CBC
HCT: 26.8 % — ABNORMAL LOW (ref 36.0–46.0)
Hemoglobin: 7.6 g/dL — ABNORMAL LOW (ref 12.0–15.0)
MCH: 21.5 pg — AB (ref 26.0–34.0)
MCHC: 28.4 g/dL — AB (ref 30.0–36.0)
MCV: 75.9 fL — ABNORMAL LOW (ref 78.0–100.0)
PLATELETS: 358 10*3/uL (ref 150–400)
RBC: 3.53 MIL/uL — ABNORMAL LOW (ref 3.87–5.11)
RDW: 20.4 % — AB (ref 11.5–15.5)
WBC: 15.2 10*3/uL — ABNORMAL HIGH (ref 4.0–10.5)

## 2017-05-10 LAB — COAG STUDIES INTERP REPORT

## 2017-05-10 SURGERY — EGD (ESOPHAGOGASTRODUODENOSCOPY)
Anesthesia: Monitor Anesthesia Care

## 2017-05-10 MED ORDER — OXYMETAZOLINE HCL 0.05 % NA SOLN
NASAL | Status: AC
  Filled 2017-05-10: qty 15

## 2017-05-10 MED ORDER — PROPOFOL 10 MG/ML IV BOLUS
INTRAVENOUS | Status: DC | PRN
  Administered 2017-05-10 (×2): 20 mg via INTRAVENOUS

## 2017-05-10 MED ORDER — FUROSEMIDE 10 MG/ML IJ SOLN
40.0000 mg | Freq: Once | INTRAMUSCULAR | Status: AC
  Administered 2017-05-10: 40 mg via INTRAVENOUS
  Filled 2017-05-10: qty 4

## 2017-05-10 MED ORDER — PANTOPRAZOLE SODIUM 40 MG PO TBEC
40.0000 mg | DELAYED_RELEASE_TABLET | Freq: Two times a day (BID) | ORAL | Status: DC
  Administered 2017-05-10 – 2017-05-11 (×3): 40 mg via ORAL
  Filled 2017-05-10 (×3): qty 1

## 2017-05-10 MED ORDER — LACTATED RINGERS IV SOLN
INTRAVENOUS | Status: DC | PRN
  Administered 2017-05-10: 15:00:00 via INTRAVENOUS

## 2017-05-10 MED ORDER — PROPOFOL 500 MG/50ML IV EMUL
INTRAVENOUS | Status: DC | PRN
  Administered 2017-05-10: 100 ug/kg/min via INTRAVENOUS

## 2017-05-10 NOTE — Progress Notes (Signed)
Call to main lab for a lab draw

## 2017-05-10 NOTE — Anesthesia Postprocedure Evaluation (Signed)
Anesthesia Post Note  Patient: Peggy House  Procedure(s) Performed: ESOPHAGOGASTRODUODENOSCOPY (EGD) (N/A )     Patient location during evaluation: PACU Anesthesia Type: MAC Level of consciousness: awake and alert Pain management: pain level controlled Vital Signs Assessment: post-procedure vital signs reviewed and stable Respiratory status: spontaneous breathing, nonlabored ventilation, respiratory function stable and patient connected to nasal cannula oxygen Cardiovascular status: stable and blood pressure returned to baseline Postop Assessment: no apparent nausea or vomiting Anesthetic complications: no    Last Vitals:  Vitals:   05/10/17 1632 05/10/17 1706  BP: 137/73 (!) 164/74  Pulse: (!) 52 (!) 48  Resp: 16 20  Temp: (!) 36.2 C 36.8 C  SpO2: 97% 99%    Last Pain:  Vitals:   05/10/17 1650  TempSrc:   PainSc: 9                  Hema Lanza P Wenonah Milo

## 2017-05-10 NOTE — Progress Notes (Signed)
CBC & BMP ordered now for am lab draw

## 2017-05-10 NOTE — Progress Notes (Signed)
Patient's endoscopy was well-tolerated although the patient did have a nosebleed which started just prior to passage of the endoscope, and which continued during the course of the procedure.  The patient was carefully managed by anesthesia to help prevent aspiration of blood.  The endoscopy did show some fairly discrete but nonbleeding AVMs along the lesser curve of the proximal and mid stomach, which were treated with argon plasma coagulation.  It should be noted that there was no blood or coffee ground material at the start of the procedure, other than a small amount of fresh red blood which had presumably come down the esophagus from her nosebleed.  2 of the 3 AVMs appeared to be quite effectively ablated by today's treatment, whereas the third was not really ablated.  (It was in a difficult to reach location, and the patient was coughing during the course of the procedure, making it difficult to direct the cautery.)  Plan:  1.  Okay for full liquid diet today and solid food in the morning (ordered) 2.  The patient would benefit from more definitive treatment of her nosebleeds, which may actually be accounting for her intermittently melenic stool, and contributing to her anemia.  I will leave to the discretion of her attending physician whether the patient should have ENT consultation while in the hospital, but it might be beneficial.  Cleotis Nipper, M.D. Pager [phone] If no answer or after 5 PM call [phone]

## 2017-05-10 NOTE — Transfer of Care (Signed)
Immediate Anesthesia Transfer of Care Note  Patient: Peggy House  Procedure(s) Performed: ESOPHAGOGASTRODUODENOSCOPY (EGD) (N/A )  Patient Location: PACU  Anesthesia Type:General  Level of Consciousness: awake, alert  and oriented  Airway & Oxygen Therapy: Patient Spontanous Breathing and Patient connected to nasal cannula oxygen  Post-op Assessment: Report given to RN and Post -op Vital signs reviewed and stable  Post vital signs: Reviewed and stable  Last Vitals:  Vitals:   05/10/17 0902 05/10/17 1430  BP:  (!) 155/62  Pulse: 63 (!) 52  Resp:  14  Temp:  36.9 C  SpO2:  100%    Last Pain:  Vitals:   05/10/17 1430  TempSrc: Oral  PainSc:       Patients Stated Pain Goal: 5 (42/10/31 2811)  Complications: No apparent anesthesia complications

## 2017-05-10 NOTE — Progress Notes (Signed)
   Subjective: Patient was sleeping comfortably this morning during rounds. She feels well today and denied any BM or hematochezia since admission. She has no complaints. Discussed her likely EGD today and probable discharge home after, depending on results, which she is agreeable to.   Objective: Vital signs in last 24 hours: Vitals:   05/09/17 2219 05/10/17 0555 05/10/17 0856 05/10/17 0902  BP: (!) 163/86 (!) 149/69 138/76   Pulse: 61 (!) 55 (!) 54 63  Resp: 18 18    Temp: 98.7 F (37.1 C) 98.5 F (36.9 C)    TempSrc:  Oral    SpO2: 100% 100%    Weight:      Height:       General: Obese female in no acute distress, initially sleeping comfortably and awoke easily Pulm: Good air movement with no wheezing or crackles CV: Regular rate and rhythm, no murmurs, no rubs Abdomen: Active bowel sounds, soft, nondistended, no tenderness to palpation Extremities: Tenderness to palpation of the lower extremities without significant pitting edema BL.  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 Her baseline hemoglobin is8-9 and was foundto be5.7in the clinic. She was transfused 2 units with appropriate response. She feels well and are planning for EGD today with probable discharge home after. Further management as outlined below.   Acute on Chronic Anemia, GIB, Chronic Epistaxis  Presented with weakness and bright red blood per rectum w/ acute drop in hemoglobin to 5.7. Transfused 2 units with appropriate and stable response. 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 She is HD stable and will likely be discharged today pending findings.  -GI to take patient for EGD today -Checking von Willebrand's   Lower Extremity Edema, ?Pulmonary Hypertension Echocardiogram showing LVEF of 55-60%, aortic and mitral regurgitation,  LAE, RAE, right ventricle dilation, and elevated pulmonary pressures. PA peak pressure 51. She notes history of OSA not on CPAP and she is also a current smoker. Her IVC was dilated and respirophasic changes were absent, indicating excess fluid. Her lasix was held on admission and she was given 1 IV dose of Lasix 40mg  today.  -Further work-up of HF and [place] outpatient  Hypertension  -Amlodipine 5 mg, Lisinopril 10 mg, and metoprolol succinate 100 mg BID  Leukocytosis  -Chronic. Follows with hematology for both her anemia and leukocytosis. Felt to be reactive due to chronic nosebleeds. She is without infectious symptoms currently.  -Continue to monitor   Dispo: Anticipated discharge today after EGD.   Duwane Gewirtz, DO 05/10/2017, 10:02 AM My Pager: [phone]

## 2017-05-10 NOTE — Progress Notes (Signed)
Call to lab to draw cbc/bmp- given # of lab phlebe - no answer at this time - will attempt to contact again

## 2017-05-10 NOTE — Interval H&P Note (Signed)
History and Physical Interval Note:  05/10/2017 3:02 PM  Peggy House  has presented today for surgery, with the diagnosis of melena, anemia, history of gastric AVM  The various methods of treatment have been discussed with the patient and family. After consideration of risks, benefits and other options for treatment, the patient has consented to  Procedure(s): ESOPHAGOGASTRODUODENOSCOPY (EGD) (N/A) as a surgical intervention .  The patient's history has been reviewed, patient examined, no change in status, stable for surgery.  I have reviewed the patient's chart and labs.  Questions were answered to the patient's satisfaction.     Cleotis Nipper

## 2017-05-10 NOTE — Op Note (Signed)
[facility] Patient Name: Peggy House Procedure Date : 05/10/2017 MRN: [id number] Attending MD: Ronald Lobo , MD Date of Birth: 18-Nov-1960 CSN: [id number] Age: 57 Admit Type: Inpatient Procedure:                Upper GI endoscopy Indications:              Iron deficiency anemia, recurrent, with prior                            history of gastric AVM's most recently APC'd 2016.                            Patient has also been having daily nosebleeds. Providers:                Ronald Lobo, MD, Elna Breslow, RN, William Dalton, Technician Referring MD:              Medicines:                Monitored Anesthesia Care Complications:            No immediate complications. Estimated Blood Loss:     Estimated blood loss was minimal. Procedure:                Pre-Anesthesia Assessment:                           - Prior to the procedure, a History and Physical                            was performed, and patient medications and                            allergies were reviewed. The patient's tolerance of                            previous anesthesia was also reviewed. The risks                            and benefits of the procedure and the sedation                            options and risks were discussed with the patient.                            All questions were answered, and informed consent                            was obtained. Prior Anticoagulants: The patient has                            taken no previous anticoagulant or antiplatelet  agents. ASA Grade Assessment: III - A patient with                            severe systemic disease. After reviewing the risks                            and benefits, the patient was deemed in                            satisfactory condition to undergo the procedure.                           After obtaining informed consent, the endoscope was           passed under direct vision. Throughout the                            procedure, the patient's blood pressure, pulse, and                            oxygen saturations were monitored continuously. The                            upper GI endoscopy was accomplished without                            difficulty. The patient tolerated the procedure                            well, except for a NOSEBLEED which occurred during                            sedation (?secondary to irritation from the nasal                            oxygen cannula???) and continued throughout and                            following the procedure until the nose was treated                            by the anesthesiologist with Afrin spray and cotton                            gauze packing. The ( [id number]) [id number] was                            introduced through the mouth, and advanced to the                            second part of duodenum. Scope In: Scope Out: Findings:      The examined esophagus was normal.      Three 3 mm non-bleeding angiodysplastic lesions were found in the  proximal stomach on the lesser curvature aspect. Although the lesions       were not bleeding, and there was no blood in the stomach at the time of       the exam (except for a small amount of fressh red blood in the cardia,       which appeared to have dripped down the esophagus from her nosebleed), I       elected to perform fulguration to ablate the lesions, to hopefully       prevent future bleeding. This was done by argon plasma coagulation, and       appeared to be successful in ablating two of the three lesions, whereas       the distal-most one could not be effectively engaged with the coagulator       because of its location and the fact the patient was coughing..       Estimated blood loss was minimal.      The exam of the stomach was otherwise normal.      The cardia and gastric fundus were normal on  retroflexion.      The examined duodenum was normal. Impression:               - Normal esophagus.                           - Three non-bleeding angiodysplastic lesions in the                            stomach. Treated with argon plasma coagulation                            (APC).                           - Normal examined duodenum.                           - No specimens collected. Moderate Sedation:      This patient was sedated with monitored anesthesia care, not moderate       sedation. Recommendation:           - Observe patient's clinical course following                            today's procedure with therapeutic intervention.                           - Full liquid diet today, then advance as tolerated                            to resume regular diet.                           - Continue present medications. Procedure Code(s):        --- Professional ---                           [phone], Esophagogastroduodenoscopy, flexible,  transoral; with control of bleeding, any method Diagnosis Code(s):        --- Professional ---                           K31.819, Angiodysplasia of stomach and duodenum                            without bleeding                           D50.9, Iron deficiency anemia, unspecified CPT copyright 2016 [employer]. All rights reserved. The codes documented in this report are preliminary and upon coder review may  be revised to meet current compliance requirements. Ronald Lobo, MD 05/10/2017 3:55:19 PM This report has been signed electronically. Number of Addenda: 0

## 2017-05-10 NOTE — Anesthesia Preprocedure Evaluation (Addendum)
Anesthesia Evaluation  Patient identified by MRN, date of birth, ID band Patient awake    Reviewed: Allergy & Precautions, NPO status , Patient's Chart, lab work & pertinent test results, reviewed documented beta blocker date and time   History of Anesthesia Complications Negative for: history of anesthetic complications  Airway Mallampati: III  TM Distance: >3 FB Neck ROM: Full    Dental  (+) Missing, Dental Advisory Given, Poor Dentition,    Pulmonary neg COPD, Current Smoker,    breath sounds clear to auscultation       Cardiovascular hypertension, Pt. on medications and Pt. on home beta blockers (-) angina(-) Past MI and (-) CHF (-) dysrhythmias  Rhythm:Regular  ECHO: LV EF: 55% - 60%   Neuro/Psych PSYCHIATRIC DISORDERS Anxiety Depression negative neurological ROS     GI/Hepatic Neg liver ROS, GERD  Medicated and Controlled,  Endo/Other  Morbid obesity  Renal/GU negative Renal ROS     Musculoskeletal   Abdominal (+) + obese,   Peds  Hematology  (+) anemia , HLD    Anesthesia Other Findings   Reproductive/Obstetrics                            Anesthesia Physical  Anesthesia Plan  ASA: III  Anesthesia Plan: MAC   Post-op Pain Management:    Induction: Intravenous  PONV Risk Score and Plan: 1 and Propofol infusion and Treatment may vary due to age or medical condition  Airway Management Planned: Natural Airway and Nasal Cannula  Additional Equipment: None  Intra-op Plan:   Post-operative Plan:   Informed Consent: I have reviewed the patients History and Physical, chart, labs and discussed the procedure including the risks, benefits and alternatives for the proposed anesthesia with the patient or authorized representative who has indicated his/her understanding and acceptance.   Dental advisory given  Plan Discussed with: CRNA  Anesthesia Plan Comments:         Anesthesia Quick Evaluation

## 2017-05-11 ENCOUNTER — Encounter (HOSPITAL_COMMUNITY): Payer: Self-pay | Admitting: Gastroenterology

## 2017-05-11 DIAGNOSIS — Q2733 Arteriovenous malformation of digestive system vessel: Secondary | ICD-10-CM

## 2017-05-11 DIAGNOSIS — D600 Chronic acquired pure red cell aplasia: Secondary | ICD-10-CM

## 2017-05-11 DIAGNOSIS — R040 Epistaxis: Secondary | ICD-10-CM

## 2017-05-11 DIAGNOSIS — I780 Hereditary hemorrhagic telangiectasia: Secondary | ICD-10-CM

## 2017-05-11 LAB — BASIC METABOLIC PANEL
ANION GAP: 10 (ref 5–15)
BUN: 7 mg/dL (ref 6–20)
CO2: 23 mmol/L (ref 22–32)
Calcium: 9.1 mg/dL (ref 8.9–10.3)
Chloride: 105 mmol/L (ref 101–111)
Creatinine, Ser: 0.91 mg/dL (ref 0.44–1.00)
Glucose, Bld: 87 mg/dL (ref 65–99)
POTASSIUM: 3.7 mmol/L (ref 3.5–5.1)
SODIUM: 138 mmol/L (ref 135–145)

## 2017-05-11 LAB — CBC
HCT: 26.5 % — ABNORMAL LOW (ref 36.0–46.0)
Hemoglobin: 7.7 g/dL — ABNORMAL LOW (ref 12.0–15.0)
MCH: 22.3 pg — ABNORMAL LOW (ref 26.0–34.0)
MCHC: 29.1 g/dL — ABNORMAL LOW (ref 30.0–36.0)
MCV: 76.6 fL — ABNORMAL LOW (ref 78.0–100.0)
PLATELETS: 364 10*3/uL (ref 150–400)
RBC: 3.46 MIL/uL — AB (ref 3.87–5.11)
RDW: 21.3 % — ABNORMAL HIGH (ref 11.5–15.5)
WBC: 15.5 10*3/uL — AB (ref 4.0–10.5)

## 2017-05-11 MED ORDER — CEPHALEXIN 500 MG PO CAPS: 500.0000 mg | ORAL_CAPSULE | Freq: Three times a day (TID) | ORAL | Status: DC

## 2017-05-11 MED ORDER — CEPHALEXIN 500 MG PO CAPS: 500.0000 mg | ORAL_CAPSULE | Freq: Three times a day (TID) | ORAL | 0 refills | Status: AC

## 2017-05-11 NOTE — Discharge Instructions (Signed)
Thank you for allowing [place] to provide your care. Please call the Ear, Nose, and Throat doctor Benjamine Mola, Su, MD) as soon as possible. Please take the Keflex and leave the nose packing in until you have seen him in clinic. 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 Please follow-up with Dr. Danford Bad in the clinic on 05/20/17.

## 2017-05-11 NOTE — Consult Note (Signed)
Reason for Consult: Recurrent epistaxis Referring Physician: Ina Homes, MD  HPI:  Peggy House is a 57 y.o. female who was recently admitted for evaluation of her acute on chronic anemia. The patient has a history of gastric AVM and daily epistaxis. She underwent esophagogastroduodenoscopy yesterday. No significant gastric bleeding was noted. However, she had an episode of severe left-sided epistaxis yesterday. According to the patient, she has been experiencing near daily nosebleeds for the past 10+ years. The bleeding is usually worse on the left side. She has never seen an ENT doctor in the past. She denies any previous nasal surgery.    Past Medical History:  Diagnosis Date  . Anxiety   . Arthritis    knnes,back  . GERD (gastroesophageal reflux disease)   . History of swelling of feet   . Hyperlipidemia   . Hypertension   . Major depressive disorder, recurrent episode ([place]) 06/05/2015  . Obesity   . Snores     Past Surgical History:  Procedure Laterality Date  . ABDOMINAL HYSTERECTOMY    . CARPAL TUNNEL RELEASE  05/13/2011   Procedure: CARPAL TUNNEL RELEASE;  Surgeon: Nita Sells, MD;  Location: [facility];  Service: Orthopedics;  Laterality: Left;  . COLONOSCOPY WITH PROPOFOL N/A 04/28/2014   Procedure: COLONOSCOPY WITH PROPOFOL;  Surgeon: Cleotis Nipper, MD;  Location: [facility] ENDOSCOPY;  Service: Endoscopy;  Laterality: N/A;  . DG TOES*L*  2/10   rt  . DILATION AND CURETTAGE OF UTERUS    . ESOPHAGOGASTRODUODENOSCOPY N/A 04/10/2014   Procedure: ESOPHAGOGASTRODUODENOSCOPY (EGD);  Surgeon: [name], MD;  Location: [facility] ENDOSCOPY;  Service: Endoscopy;  Laterality: N/A;  . ESOPHAGOGASTRODUODENOSCOPY (EGD) WITH PROPOFOL N/A 04/27/2014   Procedure: ESOPHAGOGASTRODUODENOSCOPY (EGD) WITH PROPOFOL;  Surgeon: Cleotis Nipper, MD;  Location: [facility];  Service: Endoscopy;  Laterality: N/A;  possible apc  . HOT HEMOSTASIS N/A 04/27/2014   Procedure: HOT HEMOSTASIS (ARGON PLASMA COAGULATION/BICAP);  Surgeon: Cleotis Nipper, MD;  Location: [facility] ENDOSCOPY;  Service: Endoscopy;  Laterality: N/A;  . L shoulder Surgery  2011    Family History  Problem Relation Age of Onset  . Cancer Mother        Ovarian  . Ovarian cancer Mother   . Diabetes Mother   . Kidney disease Mother   . Bleeding Disorder Mother   . Diabetes type II Sister   . Bleeding Disorder Sister   . Diabetes Sister   . Colon cancer Maternal Grandfather   . Crohn's disease Maternal Grandfather   . Stomach cancer Maternal Grandmother   . Kidney disease Son   . Bleeding Disorder Son   . Dysmenorrhea Neg Hx     Social History:  reports that she has been smoking cigarettes.  She has a 15.00 pack-year smoking history. She quit smokeless tobacco use about 38 years ago. Her smokeless tobacco use included snuff. She reports that she does not drink alcohol or use drugs.  Allergies:  Allergies  Allergen Reactions  . Nsaids Other (See Comments)    Stomach bleeding episodes; Tylenol is OK  . Tomato Hives    Prior to Admission medications   Medication Sig Start Date End Date Taking? Authorizing Provider  acetaminophen (TYLENOL) 325 MG tablet Take 650 mg by mouth every 6 (six) hours as needed.   Yes [provider]  albuterol (PROVENTIL HFA;VENTOLIN HFA) 108 (90 Base) MCG/ACT inhaler Inhale 1-2 puffs into the lungs every 6 (six) hours as needed for wheezing or shortness of  breath. 04/03/17  Yes Molt, Bethany, DO  ALPRAZolam (XANAX) 0.5 MG tablet Take 1 tablet (0.5 mg total) by mouth at bedtime as needed for anxiety or sleep. 02/04/17  Yes Molt, Bethany, DO  amLODipine (NORVASC) 5 MG tablet Take 1 tablet (5 mg total) by mouth daily. 01/11/17 01/11/18 Yes Molt, Bethany, DO  atorvastatin (LIPITOR) 80 MG tablet take 1 tablet by mouth once daily 01/11/17  Yes Molt, Bethany, DO  ferrous gluconate (FERGON) 324 MG tablet Take 1 tablet (324 mg total) by mouth 3 (three)  times daily with meals. 12/25/16 05/07/17 Yes Perlov, Marinell Blight, MD  FLUoxetine (PROZAC) 20 MG capsule Take 2 capsules (40 mg total) by mouth daily. 04/03/17  Yes Molt, Bethany, DO  furosemide (LASIX) 40 MG tablet take 1 tablet by mouth once daily 03/17/17  Yes Molt, Bethany, DO  lisinopril (PRINIVIL,ZESTRIL) 10 MG tablet Take 1 tablet (10 mg total) daily by mouth. 02/25/17  Yes Molt, Bethany, DO  metoprolol succinate (TOPROL-XL) 100 MG 24 hr tablet Take 1 tablet (100 mg total) daily by mouth. Take with or immediately following a meal. 02/25/17 02/25/18 Yes Molt, Bethany, DO  pantoprazole (PROTONIX) 40 MG tablet Take 40 mg by mouth daily.   Yes [provider]  traZODone (DESYREL) 50 MG tablet Take 1 tablet (50 mg total) by mouth at bedtime. 04/03/17  Yes Molt, Bethany, DO  Melatonin 10 MG CAPS Take 10 mg at bedtime by mouth. Patient not taking: Reported on 05/07/2017 02/25/17   Molt, Bethany, DO  pravastatin (PRAVACHOL) 40 MG tablet Take 40 mg by mouth daily.  05/20/15  [provider]    Medications:  I have reviewed the patient's current medications. Scheduled: . amLODipine  5 mg Oral Daily  . atorvastatin  80 mg Oral Daily  . ferrous sulfate  325 mg Oral Q breakfast  . FLUoxetine  40 mg Oral Daily  . lisinopril  10 mg Oral Daily  . metoprolol succinate  100 mg Oral Daily  . pantoprazole  40 mg Oral BID  . sodium chloride flush  3 mL Intravenous Q12H  . sodium chloride flush  3 mL Intravenous Q12H  . traZODone  50 mg Oral QHS   Continuous: . sodium chloride    . sodium chloride      Results for orders placed or performed during the hospital encounter of 05/07/17 (from the past 48 hour(s))  Basic metabolic panel     Status: None   Collection Time: 05/10/17  2:38 AM  Result Value Ref Range   Sodium 138 135 - 145 mmol/L   Potassium 3.7 3.5 - 5.1 mmol/L   Chloride 107 101 - 111 mmol/L   CO2 22 22 - 32 mmol/L   Glucose, Bld 98 65 - 99 mg/dL   BUN 6 6 - 20 mg/dL    Creatinine, Ser 0.89 0.44 - 1.00 mg/dL   Calcium 9.1 8.9 - 10.3 mg/dL   GFR calc non Af Amer >60 >60 mL/min   GFR calc Af Amer >60 >60 mL/min    Comment: (NOTE) The eGFR has been calculated using the CKD EPI equation. This calculation has not been validated in all clinical situations. eGFR's persistently <60 mL/min signify possible Chronic Kidney Disease.    Anion gap 9 5 - 15  CBC     Status: Abnormal   Collection Time: 05/10/17  2:38 AM  Result Value Ref Range   WBC 15.2 (H) 4.0 - 10.5 K/uL   RBC 3.53 (L) 3.87 -  5.11 MIL/uL   Hemoglobin 7.6 (L) 12.0 - 15.0 g/dL   HCT 26.8 (L) 36.0 - 46.0 %   MCV 75.9 (L) 78.0 - 100.0 fL   MCH 21.5 (L) 26.0 - 34.0 pg   MCHC 28.4 (L) 30.0 - 36.0 g/dL   RDW 20.4 (H) 11.5 - 15.5 %   Platelets 358 150 - 400 K/uL  CBC     Status: Abnormal   Collection Time: 05/11/17  3:02 AM  Result Value Ref Range   WBC 15.5 (H) 4.0 - 10.5 K/uL   RBC 3.46 (L) 3.87 - 5.11 MIL/uL   Hemoglobin 7.7 (L) 12.0 - 15.0 g/dL   HCT 26.5 (L) 36.0 - 46.0 %   MCV 76.6 (L) 78.0 - 100.0 fL   MCH 22.3 (L) 26.0 - 34.0 pg   MCHC 29.1 (L) 30.0 - 36.0 g/dL   RDW 21.3 (H) 11.5 - 15.5 %   Platelets 364 150 - 400 K/uL  Basic metabolic panel     Status: None   Collection Time: 05/11/17  3:02 AM  Result Value Ref Range   Sodium 138 135 - 145 mmol/L   Potassium 3.7 3.5 - 5.1 mmol/L   Chloride 105 101 - 111 mmol/L   CO2 23 22 - 32 mmol/L   Glucose, Bld 87 65 - 99 mg/dL   BUN 7 6 - 20 mg/dL   Creatinine, Ser 0.91 0.44 - 1.00 mg/dL   Calcium 9.1 8.9 - 10.3 mg/dL   GFR calc non Af Amer >60 >60 mL/min   GFR calc Af Amer >60 >60 mL/min    Comment: (NOTE) The eGFR has been calculated using the CKD EPI equation. This calculation has not been validated in all clinical situations. eGFR's persistently <60 mL/min signify possible Chronic Kidney Disease.    Anion gap 10 5 - 15    No results found.  Review of systems Gen: anorexia, fatigue, weakness, malaise, Denies any fever,  chills, rigors, night sweats, involuntary weight loss, and sleep disorder CV: Denies chest pain, angina, palpitations, syncope, orthopnea, PND, peripheral edema, and claudication. Resp: Denies dyspnea, cough, sputum, wheezing, coughing up blood. GU : Denies urinary burning, blood in urine, urinary frequency, urinary hesitancy, nocturnal urination, and urinary incontinence. MS: Denies joint pain or swelling.  Denies muscle weakness, cramps, atrophy.  Derm: Denies rash, itching, oral ulcerations, hives, unhealing ulcers.  Psych: Denies depression, anxiety, memory loss, suicidal ideation, hallucinations,  and confusion. Heme: Bleeding from nose, dark maroon stools, Denies bruising and enlarged lymph nodes. Neuro:  Denies any headaches, dizziness, paresthesias. Endo:  Denies any problems with DM, thyroid, adrenal function.   Blood pressure (!) 122/95, pulse (!) 52, temperature 98.6 F (37 C), resp. rate 18, height _0  (1.676 m), weight 120.7 kg (266 lb), SpO2 100 %. General:   Alert,  Well-developed, obese, pleasant and cooperative in NAD Head:  Normocephalic and atraumatic. Eyes:  PERRL, EOMI.  Ears:  Normal auricles and external auditory canals. Nose:  No deformity or lesions. Septum and turbinates are normal. Slight bloody drainage is noted from the left nasal cavity. Mouth:  No deformity or lesions.  Oropharynx pink & moist. Neck:  Supple; no masses or thyromegaly. Lungs:  Clear throughout to auscultation.   No wheezes, crackles, or rhonchi. No acute distress. Heart:  Regular rate and rhythm. Neurologic:  Alert and  oriented x4;  grossly normal neurologically. Skin:  Intact without significant lesions or rashes. Psych:  Alert and cooperative. Normal mood and affect.  Procedure:  Endoscopic control of recurrent left epistaxis. Anesthesia: Topical oxymetazoline and lidocaine Description: Risks, benefits, and alternatives of flexible endoscopy were explained to the patient.  Specific  mention was made of the risk of throat numbness with difficulty swallowing, possible bleeding from the nose and mouth, and pain from the procedure.  The patient gave oral consent to proceed.  The nasal cavities were decongested and anesthetised with a combination of oxymetazoline and 4% lidocaine solution.  A flexible scope was inserted into the left nasal cavity. Several hypervascular areas were noted on the left superior and posterior nasal septum. A small amount of bleeding was noted.  No polyp, mass, or lesion was appreciated.  Olfactory cleft was clear.  Nasopharynx was clear.  Turbinates were without mass.  The procedure was repeated on the contralateral side. No significant bleeding was noted on the right side.  In order to control the recurrent left epistaxis, a 10 cm Merocel packing was placed. The patient tolerated the procedure well.   Assessment/Plan: Recurrent left epistaxis. Multiple hypervascular areas were noted on the left superior and posterior nasal septum. A Merocel packing was placed. The patient will need gram-positive antibiotic coverage while the packing is in place. We'll start the patient on Keflex. The patient may follow-up with me as an outpatient for packing removal.  Sherria Riemann W Faelyn Sigler 05/11/2017, 11:32 AM

## 2017-05-11 NOTE — Progress Notes (Signed)
   Subjective: Patient is doing well this AM. She is asking when she can go home. We discussed that we will call ENT this am to discuss inpatient vs outpatient evaluation and pending our discussion will determine if she can be discharged. She voices understanding. All questions and concerns addressed.   Objective: Vital signs in last 24 hours: Vitals:   05/10/17 1632 05/10/17 1706 05/10/17 2243 05/11/17 0615  BP: 137/73 (!) 164/74 (!) 148/70 (!) 156/71  Pulse: (!) 52 (!) 48 (!) 50 (!) 51  Resp: 16 20 18 18   Temp: (!) 97.2 F (36.2 C) 98.3 F (36.8 C) 98.1 F (36.7 C) 98.6 F (37 C)  TempSrc:   Oral   SpO2: 97% 99% 100% 100%  Weight:      Height:       General: Obese female in no acute distress Pulm: Good air movement with no wheezing or crackles  CV: RRR, no murmurs, no rubs  Abdomen: Active bowel sounds, soft, non-distended, no tenderness to palpation   Assessment/Plan:  Peggy House a 57 year old female with chronic anemia presented to the internal medicine residency clinic with weakness and bright red blood per rectum. Her baseline hemoglobin is8-9 and was foundto be5.7in the clinic. She was transfused 2 units with appropriate response. She feels well. Further management as outlined below.   Acute on Chronic Anemia, GIB, Chronic Epistaxis  - Presented with weakness and bright red blood per rectum w/ acute drop in hemoglobin to 5.7. Transfused 2 units with appropriate and stable response.  - EGD on 1/26 showing 3 AVMs (nonbleeding) in the stomach treated with argon plasma coagulation  - Von Wileebrand factor normal  - Have called ENT to evaluated for recurrent severe nosebleeds leading to chronic anemia   Lower Extremity Edema, ?Pulmonary Hypertension - Echocardiogram showing LVEF of 55-60%, aortic and mitral regurgitation, LAE, RAE, right ventricle dilation, and elevated pulmonary pressures. PA peak pressure 51. She notes history of OSA not on CPAP and she is also a  current smoker. Her IVC was dilated and respirophasic changes were absent, indicating excess fluid.  - Further work-up of HF and [place] outpatient - Continue lasix 40 mg QD on discharge  Hypertension  - Amlodipine 5 mg, Lisinopril 10 mg, and metoprolol succinate 100 mg BID  Leukocytosis  - Chronic. Follows with hematology for both her anemia and leukocytosis. Felt to be reactive due to chronic nosebleeds. She is without infectious symptoms currently.  - Continue to monitor  Dispo: Anticipated discharge in approximately 0-1 day(s).   Peggy Homes, MD 05/11/2017, 6:20 AM My Pager: [phone]

## 2017-05-13 NOTE — Telephone Encounter (Signed)
No answer, message left on recorder.Despina Hidden Cassady1/29/201911:47 AM

## 2017-05-16 ENCOUNTER — Other Ambulatory Visit: Payer: Self-pay | Admitting: Pharmacist

## 2017-05-16 DIAGNOSIS — I10 Essential (primary) hypertension: Secondary | ICD-10-CM

## 2017-05-16 MED ORDER — AMLODIPINE BESYLATE 5 MG PO TABS: 5.0000 mg | ORAL_TABLET | Freq: Every day | ORAL | 0 refills | Status: DC

## 2017-05-16 NOTE — Telephone Encounter (Signed)
Lm for rtc 

## 2017-05-17 ENCOUNTER — Other Ambulatory Visit: Payer: Self-pay | Admitting: Internal Medicine

## 2017-05-20 ENCOUNTER — Ambulatory Visit (INDEPENDENT_AMBULATORY_CARE_PROVIDER_SITE_OTHER): Payer: Self-pay | Admitting: Internal Medicine

## 2017-05-20 ENCOUNTER — Encounter: Payer: Self-pay | Admitting: Internal Medicine

## 2017-05-20 VITALS — BP 174/77 | HR 74 | Temp 98.5°F | Ht 66.0 in | Wt 256.7 lb

## 2017-05-20 DIAGNOSIS — I872 Venous insufficiency (chronic) (peripheral): Secondary | ICD-10-CM

## 2017-05-20 DIAGNOSIS — I10 Essential (primary) hypertension: Secondary | ICD-10-CM

## 2017-05-20 DIAGNOSIS — Z91120 Patient's intentional underdosing of medication regimen due to financial hardship: Secondary | ICD-10-CM

## 2017-05-20 DIAGNOSIS — D500 Iron deficiency anemia secondary to blood loss (chronic): Secondary | ICD-10-CM

## 2017-05-20 DIAGNOSIS — D62 Acute posthemorrhagic anemia: Secondary | ICD-10-CM

## 2017-05-20 DIAGNOSIS — I780 Hereditary hemorrhagic telangiectasia: Secondary | ICD-10-CM

## 2017-05-20 DIAGNOSIS — Z79899 Other long term (current) drug therapy: Secondary | ICD-10-CM

## 2017-05-20 DIAGNOSIS — R040 Epistaxis: Secondary | ICD-10-CM

## 2017-05-20 DIAGNOSIS — F339 Major depressive disorder, recurrent, unspecified: Secondary | ICD-10-CM

## 2017-05-20 DIAGNOSIS — F17210 Nicotine dependence, cigarettes, uncomplicated: Secondary | ICD-10-CM

## 2017-05-20 DIAGNOSIS — D649 Anemia, unspecified: Secondary | ICD-10-CM

## 2017-05-20 DIAGNOSIS — F331 Major depressive disorder, recurrent, moderate: Secondary | ICD-10-CM

## 2017-05-20 DIAGNOSIS — Z9889 Other specified postprocedural states: Secondary | ICD-10-CM

## 2017-05-20 MED ORDER — FLUOXETINE HCL 20 MG PO CAPS: 40.0000 mg | ORAL_CAPSULE | Freq: Every day | ORAL | 1 refills | Status: DC

## 2017-05-20 MED ORDER — AMLODIPINE BESYLATE 5 MG PO TABS: 5.0000 mg | ORAL_TABLET | Freq: Every day | ORAL | 0 refills | Status: DC

## 2017-05-20 MED ORDER — METOPROLOL TARTRATE 50 MG PO TABS: 50.0000 mg | ORAL_TABLET | Freq: Two times a day (BID) | ORAL | 1 refills | Status: DC

## 2017-05-20 MED ORDER — LISINOPRIL 10 MG PO TABS: 10.0000 mg | ORAL_TABLET | Freq: Every day | ORAL | 1 refills | Status: DC

## 2017-05-20 MED ORDER — TRAZODONE HCL 50 MG PO TABS: 50.0000 mg | ORAL_TABLET | Freq: Every day | ORAL | 0 refills | Status: DC

## 2017-05-20 NOTE — Assessment & Plan Note (Signed)
Has been unable to afford her Prozac at her pharmacy. On $4 list, have sent in 1 month supply to [facility].

## 2017-05-20 NOTE — Assessment & Plan Note (Addendum)
Admitted 1/23-1/26 for acute on chronic blood loss anemia secondary to large-volume recurrent epistaxis and bleeding gastric AVMs. Transfused 2 units with appropriate response. She was seen by both GI and ENT who evaluated her with EGD and direct nasolaryngoscopy respectively. ENT placed nasal packing and provided Rx for keflex to be seen until their follow-up appointment with her. Unfortunately she missed that appointment due to family issues however is rescheduling for this week. She was started on PPI as well during hospitalization.  No major nosebleeds since discharge and denies any blood per rectum. No dark stools and has been taking her Protonix.  She has 1-2 week supply of Keflex at home and will call ENT or our office should she run out.

## 2017-05-20 NOTE — Progress Notes (Signed)
   CC: HFU of acute anemia, unable to afford medications  HPI:  [name] is a 57 y.o. 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 I admitted patient from clinic 05/07/17 after CBC obtained due to complaints of fatigue and BRBPR showed Hb 5.7. Please see problem-based charting for details of this and her other chronic medical conditions. Patient does note she is having a hard time with her pharmacy taking her Medicaid and has been unable to refill most of her medicines since discharge.   Past Medical History:  Diagnosis Date  . Anxiety   . Arthritis    knnes,back  . GERD (gastroesophageal reflux disease)   . History of swelling of feet   . Hyperlipidemia   . Hypertension   . Major depressive disorder, recurrent episode ([place]) 06/05/2015  . Obesity   . Snores    Review of Systems:   General: Denies fevers, chills, weight loss, fatigue HEENT: Denies changes in vision, sore throat Cardiac: Denies CP, SOB Pulmonary: Denies cough, PND Abd: Denies changes in bowels, denies blood or dark stools Extremities: Denies weakness or swelling  Physical Exam: General: Alert, in no acute distress. Pleasant and conversant HEENT: +Nasal packing left nare. Minimal blood visible on packing. No icterus, injection or ptosis. No hoarseness or dysarthria  Cardiac: RRR, no MGR appreciated Pulmonary: CTA BL with normal WOB on RA. Able to speak in complete sentences Abd: Soft, non-tender. +bs Extremities: Warm, perfused. No significant pedal edema. Changes of chronic venous insufficiency appreciated.   Vitals:   05/20/17 1440  BP: (!) 174/77  Pulse: 74  Temp: 98.5 F (36.9 C)  TempSrc: Oral  SpO2: 99%  Weight: 256 lb 11.2 oz (116.4 kg)  Height: 5\' 6"  (1.676 m)   Assessment & Plan:   See Encounters Tab for problem based charting.  Patient  discussed with Dr. Angelia Mould

## 2017-05-20 NOTE — Assessment & Plan Note (Addendum)
Multifactorial due to blood loss and iron deficiency. She is compliant with her iron suppl however refuses IV replacement. She follows with hematology regularly. Will get CBC today to evaluate hemoglobin. Was 7.7 at time of dc which is near baseline.

## 2017-05-20 NOTE — Assessment & Plan Note (Signed)
She is hypertensive today. [name] has been unable to afford her blood pressure medications since discharge due to issues with her Medicaid. She is however able to afford $4 medications. Have provided 1 month Rx of Amlodipine 5mg , Lisinopril 10mg , and Metoprolol 50mg  BID (on Toprol 100mg  but not on $4 list) which have been sent to patients [facility]. She is working with her case Freight forwarder to get her insurance issue resolved. Patient to contact if she has issues obtaining medications.

## 2017-05-20 NOTE — Patient Instructions (Signed)
It was great seeing you today! I'm glad you are doing well.   Today we will be checking your blood counts to make sure your anemia is stable. Please continue taking iron supplements and following with the blood doctor.   I'm sorry there is an issue with your insurance. Fortunately your medications are on the $4 at [facility]!!

## 2017-05-21 LAB — CBC WITH DIFFERENTIAL/PLATELET
Basophils Absolute: 0 10*3/uL (ref 0.0–0.2)
Basos: 0 %
EOS (ABSOLUTE): 0.1 10*3/uL (ref 0.0–0.4)
EOS: 1 %
HEMATOCRIT: 28.8 % — AB (ref 34.0–46.6)
Hemoglobin: 8.4 g/dL — ABNORMAL LOW (ref 11.1–15.9)
IMMATURE GRANULOCYTES: 0 %
Immature Grans (Abs): 0 10*3/uL (ref 0.0–0.1)
Lymphocytes Absolute: 2.5 10*3/uL (ref 0.7–3.1)
Lymphs: 18 %
MCH: 21.9 pg — ABNORMAL LOW (ref 26.6–33.0)
MCHC: 29.2 g/dL — ABNORMAL LOW (ref 31.5–35.7)
MCV: 75 fL — ABNORMAL LOW (ref 79–97)
MONOCYTES: 5 %
MONOS ABS: 0.7 10*3/uL (ref 0.1–0.9)
NEUTROS PCT: 76 %
Neutrophils Absolute: 11.1 10*3/uL — ABNORMAL HIGH (ref 1.4–7.0)
Platelets: 471 10*3/uL — ABNORMAL HIGH (ref 150–379)
RBC: 3.84 x10E6/uL (ref 3.77–5.28)
RDW: 21.1 % — ABNORMAL HIGH (ref 12.3–15.4)
WBC: 14.6 10*3/uL — AB (ref 3.4–10.8)

## 2017-05-21 NOTE — Progress Notes (Signed)
Internal Medicine Clinic Attending  Case discussed with Dr. Molt at the time of the visit.  We reviewed the resident's history and exam and pertinent patient test results.  I agree with the assessment, diagnosis, and plan of care documented in the resident's note. 

## 2017-05-22 NOTE — Telephone Encounter (Signed)
F/u 2/5 with pcp, lm for rtc

## 2017-05-30 ENCOUNTER — Other Ambulatory Visit: Payer: Medicaid Other

## 2017-05-30 ENCOUNTER — Ambulatory Visit: Payer: Medicaid Other | Admitting: Hematology

## 2017-06-13 ENCOUNTER — Other Ambulatory Visit: Payer: Self-pay

## 2017-06-13 ENCOUNTER — Ambulatory Visit: Payer: Self-pay | Admitting: Hematology

## 2017-06-18 ENCOUNTER — Other Ambulatory Visit: Payer: Self-pay | Admitting: Internal Medicine

## 2017-06-24 ENCOUNTER — Telehealth: Payer: Self-pay | Admitting: *Deleted

## 2017-06-24 NOTE — Telephone Encounter (Signed)
Xanax rx- refilled on 3/11 (Print) per Dr Danford Bad called to [facility].

## 2017-06-30 ENCOUNTER — Other Ambulatory Visit: Payer: Self-pay

## 2017-06-30 NOTE — Telephone Encounter (Signed)
ALPRAZolam (XANAX) 0.5 MG tablet, refill request @ [facility] on [address].

## 2017-07-02 NOTE — Telephone Encounter (Signed)
This was called to [facility] [address] by [place].

## 2017-09-02 ENCOUNTER — Encounter: Payer: Medicaid Other | Admitting: Internal Medicine

## 2017-09-09 ENCOUNTER — Ambulatory Visit: Payer: Medicaid Other | Admitting: Internal Medicine

## 2017-09-09 ENCOUNTER — Other Ambulatory Visit: Payer: Self-pay

## 2017-09-09 ENCOUNTER — Encounter: Payer: Self-pay | Admitting: Internal Medicine

## 2017-09-09 VITALS — BP 172/63 | HR 50 | Temp 99.2°F | Ht 66.0 in | Wt 274.5 lb

## 2017-09-09 DIAGNOSIS — R635 Abnormal weight gain: Secondary | ICD-10-CM | POA: Diagnosis not present

## 2017-09-09 DIAGNOSIS — I780 Hereditary hemorrhagic telangiectasia: Secondary | ICD-10-CM | POA: Diagnosis not present

## 2017-09-09 DIAGNOSIS — Z6841 Body Mass Index (BMI) 40.0 and over, adult: Secondary | ICD-10-CM

## 2017-09-09 DIAGNOSIS — I10 Essential (primary) hypertension: Secondary | ICD-10-CM

## 2017-09-09 DIAGNOSIS — M7989 Other specified soft tissue disorders: Secondary | ICD-10-CM

## 2017-09-09 DIAGNOSIS — E785 Hyperlipidemia, unspecified: Secondary | ICD-10-CM

## 2017-09-09 DIAGNOSIS — F339 Major depressive disorder, recurrent, unspecified: Secondary | ICD-10-CM

## 2017-09-09 DIAGNOSIS — Z79899 Other long term (current) drug therapy: Secondary | ICD-10-CM | POA: Diagnosis not present

## 2017-09-09 DIAGNOSIS — F331 Major depressive disorder, recurrent, moderate: Secondary | ICD-10-CM

## 2017-09-09 MED ORDER — METOPROLOL SUCCINATE ER 100 MG PO TB24: 100.0000 mg | ORAL_TABLET | Freq: Every day | ORAL | 0 refills | Status: DC

## 2017-09-09 MED ORDER — FUROSEMIDE 40 MG PO TABS: 40.0000 mg | ORAL_TABLET | Freq: Every day | ORAL | 2 refills | Status: DC

## 2017-09-09 MED ORDER — LISINOPRIL 10 MG PO TABS: 20.0000 mg | ORAL_TABLET | Freq: Every day | ORAL | 1 refills | Status: DC

## 2017-09-09 MED ORDER — SERTRALINE HCL 50 MG PO TABS: 50.0000 mg | ORAL_TABLET | Freq: Every day | ORAL | 1 refills | Status: DC

## 2017-09-09 NOTE — Progress Notes (Signed)
   CC: leg swelling, depression  HPI:  Peggy House is a 57 y.o. F with HTN, HLD, HHT, depression and chronic leg swelling who presents today with weight gain, progressive LE swelling and ongoing depression.   For details regarding today's visit and the status of their chronic medical issues, please refer to the assessment and plan.  Past Medical History:  Diagnosis Date  . Anxiety   . Arthritis    knnes,back  . GERD (gastroesophageal reflux disease)   . History of swelling of feet   . Hyperlipidemia   . Hypertension   . Major depressive disorder, recurrent episode ([place]) 06/05/2015  . Obesity   . Snores    Review of Systems:   General: Denies fevers, chills HEENT: Denies acute changes in vision, significant nosebleeds, headache Cardiac: Admits to DOE. Denies CP, palpitations Pulmonary: Admits to 3-pillow orthopnea, denies PND. Abd: Denies abdominal pain, changes in bowels Extremities: Admits to BL LE swelling  Physical Exam: General: Alert, in no acute distress but seems down and often tearful. HEENT: No icterus, injection or ptosis. No blood in posterior oropharynx. Cardiac: Bradycardic, ~60 bpm, no murmur appreciated Pulmonary: CTA BL with normal WOB on RA. Able to speak in complete sentences, although does have some DOE. Abd: Soft, non-tender. +bs Extremities: Warm, perfused. Brawny edema BL LE to knees. No blistering.   Vitals:   09/09/17 1530  BP: (!) 172/63  Pulse: (!) 50  Temp: 99.2 F (37.3 C)  TempSrc: Oral  SpO2: 100%  Weight: 274 lb 8 oz (124.5 kg)  Height: 5\' 6"  (1.676 m)   Body mass index is 44.31 kg/m.  Assessment & Plan:   See Encounters Tab for problem based charting.  Patient discussed with Dr. Angelia Mould

## 2017-09-09 NOTE — Patient Instructions (Signed)
It was nice seeing you today. Thank you for choosing [facility] for your Primary Care.   Today we talked about:  1) Swelling. I have sent a refill of your Lasix to your pharmacy. Please take 1 tablet daily. I would like you to return to clinic in 10 days to see how you are doing with your fluid status, and see if we need to increase your lasix dose. Please try to avoid excessive salt. 2) High Blood Pressure: Your high blood pressure could be related to your excess fluid, but I think we do need to increase your medications. Please take Lasix 20 mg daily, in addition to your Metoprolol and Amlodipine.  3) Depression: Please STOP taking Prozac and START taking Zoloft daily. Since Trazodone and Xanax are not helping, please stop those medications.    FOLLOW-UP INSTRUCTIONS When: 10 days For: weight check What to bring: medications  Please contact the clinic if you have any problems, or need to be seen sooner.

## 2017-09-10 ENCOUNTER — Encounter: Payer: Self-pay | Admitting: Internal Medicine

## 2017-09-10 MED ORDER — AMLODIPINE BESYLATE 5 MG PO TABS: 5.0000 mg | ORAL_TABLET | Freq: Every day | ORAL | 0 refills | Status: DC

## 2017-09-10 NOTE — Assessment & Plan Note (Signed)
Assessment & Plan: No significant nosebleeds since our last visit. Will keep monitoring.

## 2017-09-10 NOTE — Assessment & Plan Note (Signed)
Assessment: Hypertensive today which did not resolve on recheck. Reportedly compliant with Lisinopril 10mg , Amlodipine 5mg  and Toprol 100mg  daily. Some degree of elevation could be related to her volume status, however it's been some time since she's been well-controlled and believe she would benefit from increased antihypertensive regimen.   Plan: Increase Lisinopril to 20mg  daily, continue Toprol 100mg  and Amlodipine 5mg . Diuresis as noted in separate problem. At follow-up, could consider increasing Amlodipine vs starting Arlyce Harman if still uncontrolled.

## 2017-09-10 NOTE — Assessment & Plan Note (Addendum)
Assessment: Pt with 20lb weight gain and BL LE edema since 2/19 and attributes this to not taking her Lasix (thinks she ran out and didn't get refill). Endorse DOE but has stable 3-pillow orthopnea from her baseline. She has brawny edema of BL LE to knees however lungs are clear on exam.  ECHO during her recent admission with preserved EF, no diastolic dysfunction however there was some mild LVH and IVC was dilated.   Plan: Trial of PO Lasix 40mg  daily x 10 days; advised patient to RTC later next week to evaluate her response to diuresis and adjust regimen. May need to start scheduled lasix dosing (daily vs MWF) to avoid volume overload. Working on better control of HTN as well, which should help with peripheral edema moving forward.

## 2017-09-10 NOTE — Assessment & Plan Note (Signed)
Assessment: Patient tearful during our exam and feels the increased Prozac dose isn't making a significant difference in her mood. Declined any specific stressors or major life-events, although has been missing several Prozac doses since our last visit. She also continues to have difficulty with both falling and staying asleep, noting Trazodone and low-dose Xanax are no longer effective.   Plan: Discontinue Prozac, start Zoloft 50mg  daily. Patient advised to discontinue Trazodone and her prn Xanax as these don't seem to be helping currently. Hopeful that better control of depression will help with insomnia. Discussed sleep hygiene and importance of taking her SSRI regularly. Pt to contact clinic if any issues with medication changes.

## 2017-09-11 NOTE — Progress Notes (Signed)
Internal Medicine Clinic Attending  Case discussed with Dr. Molt at the time of the visit.  We reviewed the resident's history and exam and pertinent patient test results.  I agree with the assessment, diagnosis, and plan of care documented in the resident's note. 

## 2017-09-21 ENCOUNTER — Inpatient Hospital Stay (HOSPITAL_COMMUNITY)
Admission: EM | Admit: 2017-09-21 | Discharge: 2017-09-25 | DRG: 377 | Disposition: A | Discharge status: Home Health | Payer: Medicaid Other | Attending: Oncology | Admitting: Oncology

## 2017-09-21 ENCOUNTER — Emergency Department (HOSPITAL_COMMUNITY): Payer: Medicaid Other

## 2017-09-21 ENCOUNTER — Encounter (HOSPITAL_COMMUNITY): Payer: Self-pay | Admitting: Emergency Medicine

## 2017-09-21 ENCOUNTER — Other Ambulatory Visit: Payer: Self-pay

## 2017-09-21 DIAGNOSIS — E876 Hypokalemia: Secondary | ICD-10-CM | POA: Diagnosis present

## 2017-09-21 DIAGNOSIS — Q2733 Arteriovenous malformation of digestive system vessel: Secondary | ICD-10-CM | POA: Diagnosis present

## 2017-09-21 DIAGNOSIS — I272 Pulmonary hypertension, unspecified: Secondary | ICD-10-CM | POA: Diagnosis present

## 2017-09-21 DIAGNOSIS — K264 Chronic or unspecified duodenal ulcer with hemorrhage: Principal | ICD-10-CM | POA: Diagnosis present

## 2017-09-21 DIAGNOSIS — D649 Anemia, unspecified: Secondary | ICD-10-CM | POA: Diagnosis present

## 2017-09-21 DIAGNOSIS — K920 Hematemesis: Secondary | ICD-10-CM | POA: Diagnosis present

## 2017-09-21 DIAGNOSIS — R451 Restlessness and agitation: Secondary | ICD-10-CM | POA: Diagnosis present

## 2017-09-21 DIAGNOSIS — D509 Iron deficiency anemia, unspecified: Secondary | ICD-10-CM | POA: Diagnosis present

## 2017-09-21 DIAGNOSIS — I10 Essential (primary) hypertension: Secondary | ICD-10-CM | POA: Diagnosis present

## 2017-09-21 DIAGNOSIS — Z6841 Body Mass Index (BMI) 40.0 and over, adult: Secondary | ICD-10-CM

## 2017-09-21 DIAGNOSIS — I780 Hereditary hemorrhagic telangiectasia: Secondary | ICD-10-CM | POA: Diagnosis present

## 2017-09-21 DIAGNOSIS — D62 Acute posthemorrhagic anemia: Secondary | ICD-10-CM | POA: Diagnosis present

## 2017-09-21 DIAGNOSIS — D72829 Elevated white blood cell count, unspecified: Secondary | ICD-10-CM | POA: Diagnosis present

## 2017-09-21 DIAGNOSIS — Z781 Physical restraint status: Secondary | ICD-10-CM

## 2017-09-21 DIAGNOSIS — F17210 Nicotine dependence, cigarettes, uncomplicated: Secondary | ICD-10-CM | POA: Diagnosis present

## 2017-09-21 DIAGNOSIS — Y848 Other medical procedures as the cause of abnormal reaction of the patient, or of later complication, without mention of misadventure at the time of the procedure: Secondary | ICD-10-CM | POA: Diagnosis not present

## 2017-09-21 DIAGNOSIS — Z79899 Other long term (current) drug therapy: Secondary | ICD-10-CM

## 2017-09-21 DIAGNOSIS — Z4682 Encounter for fitting and adjustment of non-vascular catheter: Secondary | ICD-10-CM | POA: Diagnosis not present

## 2017-09-21 DIAGNOSIS — Z978 Presence of other specified devices: Secondary | ICD-10-CM | POA: Diagnosis not present

## 2017-09-21 DIAGNOSIS — K922 Gastrointestinal hemorrhage, unspecified: Secondary | ICD-10-CM | POA: Diagnosis not present

## 2017-09-21 DIAGNOSIS — Z90710 Acquired absence of both cervix and uterus: Secondary | ICD-10-CM

## 2017-09-21 DIAGNOSIS — E785 Hyperlipidemia, unspecified: Secondary | ICD-10-CM | POA: Diagnosis present

## 2017-09-21 DIAGNOSIS — K31819 Angiodysplasia of stomach and duodenum without bleeding: Secondary | ICD-10-CM

## 2017-09-21 DIAGNOSIS — J9601 Acute respiratory failure with hypoxia: Secondary | ICD-10-CM | POA: Diagnosis present

## 2017-09-21 DIAGNOSIS — K449 Diaphragmatic hernia without obstruction or gangrene: Secondary | ICD-10-CM | POA: Diagnosis present

## 2017-09-21 DIAGNOSIS — Z833 Family history of diabetes mellitus: Secondary | ICD-10-CM

## 2017-09-21 DIAGNOSIS — J9811 Atelectasis: Secondary | ICD-10-CM | POA: Diagnosis not present

## 2017-09-21 DIAGNOSIS — I248 Other forms of acute ischemic heart disease: Secondary | ICD-10-CM | POA: Diagnosis present

## 2017-09-21 DIAGNOSIS — E8771 Transfusion associated circulatory overload: Secondary | ICD-10-CM | POA: Diagnosis not present

## 2017-09-21 DIAGNOSIS — K219 Gastro-esophageal reflux disease without esophagitis: Secondary | ICD-10-CM | POA: Diagnosis present

## 2017-09-21 LAB — COMPREHENSIVE METABOLIC PANEL
ALK PHOS: 67 U/L (ref 38–126)
ALT: 8 U/L — ABNORMAL LOW (ref 14–54)
ANION GAP: 9 (ref 5–15)
AST: 13 U/L — ABNORMAL LOW (ref 15–41)
Albumin: 2.6 g/dL — ABNORMAL LOW (ref 3.5–5.0)
BILIRUBIN TOTAL: 0.4 mg/dL (ref 0.3–1.2)
BUN: 25 mg/dL — ABNORMAL HIGH (ref 6–20)
CALCIUM: 8 mg/dL — AB (ref 8.9–10.3)
CO2: 25 mmol/L (ref 22–32)
CREATININE: 0.92 mg/dL (ref 0.44–1.00)
Chloride: 106 mmol/L (ref 101–111)
Glucose, Bld: 123 mg/dL — ABNORMAL HIGH (ref 65–99)
Potassium: 3.5 mmol/L (ref 3.5–5.1)
SODIUM: 140 mmol/L (ref 135–145)
TOTAL PROTEIN: 5.2 g/dL — AB (ref 6.5–8.1)

## 2017-09-21 LAB — I-STAT CHEM 8, ED
BUN: 27 mg/dL — ABNORMAL HIGH (ref 6–20)
Calcium, Ion: 1.12 mmol/L — ABNORMAL LOW (ref 1.15–1.40)
Chloride: 105 mmol/L (ref 101–111)
Creatinine, Ser: 0.8 mg/dL (ref 0.44–1.00)
Glucose, Bld: 117 mg/dL — ABNORMAL HIGH (ref 65–99)
HCT: 16 % — ABNORMAL LOW (ref 36.0–46.0)
HEMOGLOBIN: 5.4 g/dL — AB (ref 12.0–15.0)
POTASSIUM: 3.9 mmol/L (ref 3.5–5.1)
SODIUM: 144 mmol/L (ref 135–145)
TCO2: 25 mmol/L (ref 22–32)

## 2017-09-21 LAB — CBC
HCT: 14.4 % — ABNORMAL LOW (ref 36.0–46.0)
HEMATOCRIT: 36.1 % (ref 36.0–46.0)
HEMOGLOBIN: 3.9 g/dL — AB (ref 12.0–15.0)
HEMOGLOBIN: 11.1 g/dL — AB (ref 12.0–15.0)
MCH: 20.9 pg — ABNORMAL LOW (ref 26.0–34.0)
MCH: 27.4 pg (ref 26.0–34.0)
MCHC: 27.1 g/dL — ABNORMAL LOW (ref 30.0–36.0)
MCHC: 30.7 g/dL (ref 30.0–36.0)
MCV: 77 fL — ABNORMAL LOW (ref 78.0–100.0)
MCV: 89.1 fL (ref 78.0–100.0)
PLATELETS: 333 10*3/uL (ref 150–400)
Platelets: 210 10*3/uL (ref 150–400)
RBC: 1.87 MIL/uL — AB (ref 3.87–5.11)
RBC: 4.05 MIL/uL (ref 3.87–5.11)
RDW: 25.2 % — ABNORMAL HIGH (ref 11.5–15.5)
RDW: 17.4 % — ABNORMAL HIGH (ref 11.5–15.5)
WBC: 18.5 10*3/uL — AB (ref 4.0–10.5)
WBC: 17 10*3/uL — AB (ref 4.0–10.5)

## 2017-09-21 LAB — I-STAT BETA HCG BLOOD, ED (MC, WL, AP ONLY): I-stat hCG, quantitative: <5 m[IU]/mL (ref <5)

## 2017-09-21 LAB — LIPASE, BLOOD: Lipase: 23 U/L (ref 11–51)

## 2017-09-21 LAB — AMMONIA: AMMONIA: 90 umol/L — AB (ref 9–35)

## 2017-09-21 MED ORDER — OCTREOTIDE LOAD VIA INFUSION
100.0000 ug | Freq: Once | INTRAVENOUS | Status: AC
  Administered 2017-09-21: 100 ug via INTRAVENOUS
  Filled 2017-09-21: qty 50

## 2017-09-21 MED ORDER — PANTOPRAZOLE SODIUM 40 MG IV SOLR: 40.0000 mg | Freq: Two times a day (BID) | INTRAVENOUS | Status: DC

## 2017-09-21 MED ORDER — SODIUM CHLORIDE 0.9 % IV SOLN
Freq: Once | INTRAVENOUS | Status: AC
  Administered 2017-09-21: 23:00:00 via INTRAVENOUS

## 2017-09-21 MED ORDER — SODIUM CHLORIDE 0.9 % IV SOLN
80.0000 mg | Freq: Once | INTRAVENOUS | Status: AC
  Administered 2017-09-21: 80 mg via INTRAVENOUS
  Filled 2017-09-21: qty 80

## 2017-09-21 MED ORDER — ONDANSETRON HCL 4 MG/2ML IJ SOLN
4.0000 mg | Freq: Once | INTRAMUSCULAR | Status: AC | PRN
  Administered 2017-09-24: 4 mg via INTRAVENOUS
  Filled 2017-09-21 (×2): qty 2

## 2017-09-21 MED ORDER — PROPOFOL 1000 MG/100ML IV EMUL
INTRAVENOUS | Status: AC
  Administered 2017-09-21: 23:00:00
  Filled 2017-09-21: qty 100

## 2017-09-21 MED ORDER — SODIUM CHLORIDE 0.9 % IV SOLN
8.0000 mg/h | INTRAVENOUS | Status: DC
  Administered 2017-09-21 – 2017-09-24 (×6): 8 mg/h via INTRAVENOUS
  Filled 2017-09-21 (×8): qty 80

## 2017-09-21 MED ORDER — SODIUM CHLORIDE 0.9 % IV SOLN
50.0000 ug/h | INTRAVENOUS | Status: DC
  Administered 2017-09-21: 50 ug/h via INTRAVENOUS
  Filled 2017-09-21 (×2): qty 1

## 2017-09-21 MED ORDER — SUCCINYLCHOLINE CHLORIDE 20 MG/ML IJ SOLN
INTRAMUSCULAR | Status: AC | PRN
  Administered 2017-09-21: 125 mg via INTRAVENOUS

## 2017-09-21 MED ORDER — ETOMIDATE 2 MG/ML IV SOLN
INTRAVENOUS | Status: AC | PRN
  Administered 2017-09-21: 7.5 mg via INTRAVENOUS

## 2017-09-21 NOTE — ED Provider Notes (Signed)
[facility] EMERGENCY DEPARTMENT Provider Note   CSN: [id number] Arrival date & time: 09/21/17  2139     History   Chief Complaint Chief Complaint  Patient presents with  . GI Bleeding  . Hypotension    HPI Peggy House is a 57 y.o. female.  HPI   Level 5 caveat due to severity of illness. 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  Patient states she began vomiting large quantities of blood but denies pain.  She has not had any rectal bleeding that she has noted.  She denies any previous history of same.  Past Medical History:  Diagnosis Date  . Anxiety   . Arthritis    knnes,back  . GERD (gastroesophageal reflux disease)   . History of swelling of feet   . Hyperlipidemia   . Hypertension   . Major depressive disorder, recurrent episode ([place]) 06/05/2015  . Obesity   . Snores     Patient Active Problem List   Diagnosis Date Noted  . Chronic anemia 05/20/2017  . 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 Venous stasis dermatitis of both lower extremities 05/07/2017  . Recurrent epistaxis 11/29/2016  . Iron deficiency anemia 10/29/2016  . Leukocytosis 05/13/2016  . Osteoarthritis, multiple sites 06/05/2015  . Insomnia 06/05/2015  . Major depressive disorder, recurrent episode ([place]) 06/05/2015  . Morbid obesity ([place]) 04/26/2014  . Gastric AVM 04/26/2014  . GI bleed 04/25/2014  . HLD (hyperlipidemia) 04/25/2014  . Leg swelling 04/25/2014  . Essential hypertension     Past Surgical History:  Procedure Laterality Date  . ABDOMINAL HYSTERECTOMY    . CARPAL TUNNEL RELEASE  05/13/2011   Procedure: CARPAL TUNNEL RELEASE;  Surgeon: Nita Sells, MD;  Location: [place];  Service: Orthopedics;  Laterality: Left;  . COLONOSCOPY WITH PROPOFOL N/A 04/28/2014   Procedure: COLONOSCOPY WITH  PROPOFOL;  Surgeon: Cleotis Nipper, MD;  Location: [facility] ENDOSCOPY;  Service: Endoscopy;  Laterality: N/A;  . DG TOES*L*  2/10   rt  . DILATION AND CURETTAGE OF UTERUS    . ESOPHAGOGASTRODUODENOSCOPY N/A 04/10/2014   Procedure: ESOPHAGOGASTRODUODENOSCOPY (EGD);  Surgeon: [name], MD;  Location: [facility] ENDOSCOPY;  Service: Endoscopy;  Laterality: N/A;  . ESOPHAGOGASTRODUODENOSCOPY N/A 05/10/2017   Procedure: ESOPHAGOGASTRODUODENOSCOPY (EGD);  Surgeon: Ronald Lobo, MD;  Location: [facility] ENDOSCOPY;  Service: Endoscopy;  Laterality: N/A;  . ESOPHAGOGASTRODUODENOSCOPY (EGD) WITH PROPOFOL N/A 04/27/2014   Procedure: ESOPHAGOGASTRODUODENOSCOPY (EGD) WITH PROPOFOL;  Surgeon: Cleotis Nipper, MD;  Location: [place];  Service: Endoscopy;  Laterality: N/A;  possible apc  . HOT HEMOSTASIS N/A 04/27/2014   Procedure: HOT HEMOSTASIS (ARGON PLASMA COAGULATION/BICAP);  Surgeon: Cleotis Nipper, MD;  Location: [facility] ENDOSCOPY;  Service: Endoscopy;  Laterality: N/A;  . L shoulder Surgery  2011     OB History   None      Home Medications    Prior to Admission medications   Medication Sig Start Date End Date Taking? Authorizing Provider  acetaminophen (TYLENOL) 325 MG tablet Take 650 mg by mouth every 6 (six) hours as needed.    [provider]  albuterol (PROVENTIL HFA;VENTOLIN HFA) 108 (90 Base) MCG/ACT inhaler Inhale 1-2 puffs into the lungs every 6 (six) hours as needed for wheezing or shortness of breath. 04/03/17   Molt, Bethany, DO  amLODipine (NORVASC) 5 MG tablet Take 1  tablet (5 mg total) by mouth daily. 09/10/17 09/10/18  Molt, Bethany, DO  atorvastatin (LIPITOR) 80 MG tablet take 1 tablet by mouth once daily 01/11/17   Molt, Bethany, DO  FEROSUL 325 (65 Fe) MG tablet Take 325 mg by mouth daily. 08/14/17   [provider]  ferrous gluconate (FERGON) 324 MG tablet Take 1 tablet (324 mg total) by mouth 3 (three) times daily with meals. 12/25/16 05/07/17  Ardath Sax, MD    furosemide (LASIX) 40 MG tablet Take 1 tablet (40 mg total) by mouth daily. 09/09/17   Molt, Bethany, DO  lisinopril (PRINIVIL,ZESTRIL) 10 MG tablet Take 2 tablets (20 mg total) by mouth daily. 09/09/17 09/09/18  Molt, Bethany, DO  metoprolol succinate (TOPROL-XL) 100 MG 24 hr tablet Take 1 tablet (100 mg total) by mouth daily. Take with or immediately following a meal. 09/09/17   Molt, Bethany, DO  pantoprazole (PROTONIX) 40 MG tablet take 1 tablet by mouth once daily 05/19/17 08/17/17  Molt, Bethany, DO  sertraline (ZOLOFT) 50 MG tablet Take 1 tablet (50 mg total) by mouth daily. 09/09/17   Molt, Bethany, DO    Family History Family History  Problem Relation Age of Onset  . Cancer Mother        Ovarian  . Ovarian cancer Mother   . Diabetes Mother   . Kidney disease Mother   . Bleeding Disorder Mother   . Diabetes type II Sister   . Bleeding Disorder Sister   . Diabetes Sister   . Colon cancer Maternal Grandfather   . Crohn's disease Maternal Grandfather   . Stomach cancer Maternal Grandmother   . Kidney disease Son   . Bleeding Disorder Son   . Dysmenorrhea Neg Hx     Social History Social History   Tobacco Use  . Smoking status: Current Every Day Smoker    Packs/day: 0.50    Years: 30.00    Pack years: 15.00    Types: Cigarettes  . Smokeless tobacco: Former Systems developer    Types: Snuff    Quit date: 67  . Tobacco comment: 1/2 PPD  Substance Use Topics  . Alcohol use: No    Alcohol/week: 0.0 oz  . Drug use: No    Comment: last cocaine-2010     Allergies   Nsaids and Tomato   Review of Systems Review of Systems  All other systems reviewed and are negative.    Physical Exam Updated Vital Signs BP (!) 112/49 (BP Location: Right Arm)   Pulse 74   Temp 98 F (36.7 C) (Oral)   Resp 18   Ht 1.676 m (5\' 6" )   Wt 124.7 kg (275 lb)   SpO2 99%   BMI 44.39 kg/m   Physical Exam  Constitutional: She is oriented to person, place, and time. She appears well-developed and  well-nourished. She appears distressed.  Morbidly obese  HENT:  Head: Normocephalic and atraumatic.  Right Ear: External ear normal.  Left Ear: External ear normal.  Eyes: Pupils are equal, round, and reactive to light. EOM are normal.  Conjunctive are pale bilaterally  Neck: Normal range of motion. Neck supple.  Cardiovascular: Normal rate, regular rhythm and normal heart sounds.  Pulmonary/Chest: Effort normal and breath sounds normal.  Abdominal: Soft.  Patient having active hematemesis  Musculoskeletal: Normal range of motion.  Neurological: She is alert and oriented to person, place, and time.  Skin: Capillary refill takes less than 2 seconds. She is diaphoretic. There is pallor.  Skin  pale cool and diaphoretic  Psychiatric: She has a normal mood and affect.  Nursing note and vitals reviewed.    ED Treatments / Results  Labs (all labs ordered are listed, but only abnormal results are displayed) Labs Reviewed  COMPREHENSIVE METABOLIC PANEL - Abnormal; Notable for the following components:      Result Value   Glucose, Bld 123 (*)    BUN 25 (*)    Calcium 8.0 (*)    Total Protein 5.2 (*)    Albumin 2.6 (*)    AST 13 (*)    ALT 8 (*)    All other components within normal limits  CBC - Abnormal; Notable for the following components:   WBC 18.5 (*)    RBC 1.87 (*)    Hemoglobin 3.9 (*)    HCT 14.4 (*)    MCV 77.0 (*)    MCH 20.9 (*)    MCHC 27.1 (*)    RDW 25.2 (*)    All other components within normal limits  I-STAT CHEM 8, ED - Abnormal; Notable for the following components:   BUN 27 (*)    Glucose, Bld 117 (*)    Calcium, Ion 1.12 (*)    Hemoglobin 5.4 (*)    HCT 16.0 (*)    All other components within normal limits  LIPASE, BLOOD  URINALYSIS, ROUTINE W REFLEX MICROSCOPIC  COMPREHENSIVE METABOLIC PANEL  CBC WITH DIFFERENTIAL/PLATELET  PROTIME-INR  AMMONIA  DIC (DISSEMINATED INTRAVASCULAR COAGULATION) PANEL  I-STAT BETA HCG BLOOD, ED ([facility], [facility], AP ONLY)    POC OCCULT BLOOD, ED  TYPE AND SCREEN  PREPARE FRESH FROZEN PLASMA  PREPARE PLATELET PHERESIS  MASSIVE TRANSFUSION PROTOCOL ORDER (BLOOD BANK NOTIFICATION)  PREPARE RBC (CROSSMATCH)    EKG EKG Interpretation  Date/Time:  Sunday September 21 2017 21:57:50 EDT Ventricular Rate:  57 PR Interval:    QRS Duration: 82 QT Interval:  448 QTC Calculation: 437 R Axis:   -10 Text Interpretation:  Sinus rhythm Repol abnrm suggests ischemia, diffuse leads Confirmed by Pattricia Boss [phone]) on 09/21/2017 11:08:11 PM   Radiology Dg Chest Port 1 View  Result Date: 09/21/2017 CLINICAL DATA:  Endotracheal and gastric tube placement. Hematemesis. EXAM: PORTABLE CHEST 1 VIEW COMPARISON:  04/09/2017 FINDINGS: AP portable semi upright view. Endotracheal tube tip is 2 cm above the carina. Gastric tube tip and side-port are coiled within the proximal stomach. There is cardiomegaly with minimal atelectasis at the lung bases. No alveolar consolidation or CHF. No effusion or pneumothorax. No acute osseous abnormality. IMPRESSION: 1. Cardiomegaly with bibasilar atelectasis. No active pulmonary disease. 2. Tip of endotracheal tube approximately 2 cm above the carina. Gastric tube extends into the stomach. Electronically Signed   By: Ashley Royalty M.D.   On: 09/21/2017 23:06    Procedures Procedure Name: Intubation Date/Time: 09/22/2017 12:00 AM Performed by: Pattricia Boss, MD Pre-anesthesia Checklist: Patient identified, Emergency Drugs available, Suction available, Patient being monitored and Timeout performed Oxygen Delivery Method: Non-rebreather mask Preoxygenation: Pre-oxygenation with 100% oxygen Induction Type: Rapid sequence Laryngoscope Size: Glidescope Grade View: Grade I Tube size: 7.5 mm Number of attempts: 1 Placement Confirmation: ETT inserted through vocal cords under direct vision Secured at: 22 cm Tube secured with: ETT holder Dental Injury: Teeth and Oropharynx as per pre-operative assessment      OG placement Date/Time: 09/22/2017 12:03 AM Performed by: Pattricia Boss, MD Authorized by: Pattricia Boss, MD  Consent: The procedure was performed in an emergent situation. Verbal consent not obtained. Patient understanding:  patient does not state understanding of the procedure being performed Patient identity confirmed: verbally with patient Time out: Immediately prior to procedure a "time out" was called to verify the correct patient, procedure, equipment, support staff and site/side marked as required. Local anesthesia used: no  Anesthesia: Local anesthesia used: no  Sedation: Patient sedated: yes Sedation type: moderate (conscious) sedation Sedatives: etomidate     (including critical care time)  Medications Ordered in ED Medications  ondansetron (ZOFRAN) injection 4 mg (has no administration in time range)     Initial Impression / Assessment and Plan / ED Course  I have reviewed the triage vital signs and the nursing notes.  Pertinent labs & imaging results that were available during my care of the patient were reviewed by me and considered in my medical decision making (see chart for details).     11:51 PM BP  Vitals:   09/21/17 2230 09/21/17 2245  BP: (!) 198/106 138/82  Pulse: (!) 101 91  Resp: (!) 22 (!) 31  Temp:    SpO2: 100% 100%   Repeat hemoglobin after 8 units blood.  Patient continues to have blood out og at 800 cc since placed with 4 episodes of massive hematemesis prior to placement. Discussed with Dr. Watt Climes for gi Dr. Corinna Lines critical care consulted. Current bp 104/70 HR normal however on betablocker   1-gi bleed-hematemesis with known AV malformation of stomach with 2 ablated in January 1 was reported remaining 2-Patient with episode of bradycardia- ? Secondary to vagal episode 3-ekg with diffuse nsst changes- ?ischemia secondary to anemia 4-profound anemia secondary to # 1 CRITICAL CARE Performed by: Pattricia Boss Total critical care  time: 60 minutes Critical care time was exclusive of separately billable procedures and treating other patients. Critical care was necessary to treat or prevent imminent or life-threatening deterioration. Critical care was time spent personally by me on the following activities: development of treatment plan with patient and/or surrogate as well as nursing, discussions with consultants, evaluation of patient's response to treatment, examination of patient, obtaining history from patient or surrogate, ordering and performing treatments and interventions, ordering and review of laboratory studies, ordering and review of radiographic studies, pulse oximetry and re-evaluation of patient's condition.   Final Clinical Impressions(s) / ED Diagnoses   Final diagnoses:  Hematemesis  Gastrointestinal hemorrhage with hematemesis    ED Discharge Orders    None       Pattricia Boss, MD 09/22/17 0004

## 2017-09-21 NOTE — ED Notes (Signed)
Pt has had 3 episodes of bloody emesis, without warning. Ray, MD at bedside.

## 2017-09-21 NOTE — ED Triage Notes (Signed)
Pt BIB EMS from home. Approx 0400 today began having intermittent bloody emesis and dark bloody stools. EMS reports dark red emesis in bucket, approx 2L in a bucket. Pt weak, pale, diaphoretic. EMS first responded to pt approx 2hrs ago, but pt initially refused, then called back.

## 2017-09-21 NOTE — Sedation Documentation (Signed)
MTP ordered by Jeanell Sparrow, [place] at bedside.   Ray, MD initiating intubation.

## 2017-09-22 ENCOUNTER — Encounter (HOSPITAL_COMMUNITY)
Admission: EM | Disposition: A | Discharge status: Home Health | Payer: Self-pay | Source: Home / Self Care | Attending: Oncology

## 2017-09-22 ENCOUNTER — Encounter (HOSPITAL_COMMUNITY): Payer: Self-pay | Admitting: General Practice

## 2017-09-22 DIAGNOSIS — J9601 Acute respiratory failure with hypoxia: Secondary | ICD-10-CM | POA: Diagnosis present

## 2017-09-22 DIAGNOSIS — J9690 Respiratory failure, unspecified, unspecified whether with hypoxia or hypercapnia: Secondary | ICD-10-CM | POA: Diagnosis not present

## 2017-09-22 DIAGNOSIS — K922 Gastrointestinal hemorrhage, unspecified: Secondary | ICD-10-CM

## 2017-09-22 DIAGNOSIS — J9811 Atelectasis: Secondary | ICD-10-CM | POA: Diagnosis not present

## 2017-09-22 DIAGNOSIS — Z4682 Encounter for fitting and adjustment of non-vascular catheter: Secondary | ICD-10-CM | POA: Diagnosis not present

## 2017-09-22 DIAGNOSIS — I248 Other forms of acute ischemic heart disease: Secondary | ICD-10-CM | POA: Diagnosis present

## 2017-09-22 DIAGNOSIS — Z79899 Other long term (current) drug therapy: Secondary | ICD-10-CM | POA: Diagnosis not present

## 2017-09-22 DIAGNOSIS — D649 Anemia, unspecified: Secondary | ICD-10-CM | POA: Diagnosis not present

## 2017-09-22 DIAGNOSIS — I10 Essential (primary) hypertension: Secondary | ICD-10-CM | POA: Diagnosis present

## 2017-09-22 DIAGNOSIS — Z781 Physical restraint status: Secondary | ICD-10-CM | POA: Diagnosis not present

## 2017-09-22 DIAGNOSIS — Z6841 Body Mass Index (BMI) 40.0 and over, adult: Secondary | ICD-10-CM | POA: Diagnosis not present

## 2017-09-22 DIAGNOSIS — Z90710 Acquired absence of both cervix and uterus: Secondary | ICD-10-CM | POA: Diagnosis not present

## 2017-09-22 DIAGNOSIS — D72829 Elevated white blood cell count, unspecified: Secondary | ICD-10-CM | POA: Diagnosis present

## 2017-09-22 DIAGNOSIS — K449 Diaphragmatic hernia without obstruction or gangrene: Secondary | ICD-10-CM | POA: Diagnosis present

## 2017-09-22 DIAGNOSIS — F17210 Nicotine dependence, cigarettes, uncomplicated: Secondary | ICD-10-CM | POA: Diagnosis present

## 2017-09-22 DIAGNOSIS — F329 Major depressive disorder, single episode, unspecified: Secondary | ICD-10-CM | POA: Diagnosis not present

## 2017-09-22 DIAGNOSIS — Z978 Presence of other specified devices: Secondary | ICD-10-CM | POA: Diagnosis not present

## 2017-09-22 DIAGNOSIS — Q2739 Arteriovenous malformation, other site: Secondary | ICD-10-CM | POA: Diagnosis not present

## 2017-09-22 DIAGNOSIS — D509 Iron deficiency anemia, unspecified: Secondary | ICD-10-CM | POA: Diagnosis present

## 2017-09-22 DIAGNOSIS — K31819 Angiodysplasia of stomach and duodenum without bleeding: Secondary | ICD-10-CM

## 2017-09-22 DIAGNOSIS — D62 Acute posthemorrhagic anemia: Secondary | ICD-10-CM | POA: Diagnosis present

## 2017-09-22 DIAGNOSIS — E876 Hypokalemia: Secondary | ICD-10-CM | POA: Diagnosis present

## 2017-09-22 DIAGNOSIS — I780 Hereditary hemorrhagic telangiectasia: Secondary | ICD-10-CM | POA: Diagnosis present

## 2017-09-22 DIAGNOSIS — K219 Gastro-esophageal reflux disease without esophagitis: Secondary | ICD-10-CM | POA: Diagnosis present

## 2017-09-22 DIAGNOSIS — K920 Hematemesis: Secondary | ICD-10-CM | POA: Diagnosis not present

## 2017-09-22 DIAGNOSIS — E8771 Transfusion associated circulatory overload: Secondary | ICD-10-CM | POA: Diagnosis not present

## 2017-09-22 DIAGNOSIS — R451 Restlessness and agitation: Secondary | ICD-10-CM | POA: Diagnosis present

## 2017-09-22 DIAGNOSIS — K264 Chronic or unspecified duodenal ulcer with hemorrhage: Secondary | ICD-10-CM | POA: Diagnosis present

## 2017-09-22 DIAGNOSIS — I272 Pulmonary hypertension, unspecified: Secondary | ICD-10-CM | POA: Diagnosis present

## 2017-09-22 DIAGNOSIS — Y848 Other medical procedures as the cause of abnormal reaction of the patient, or of later complication, without mention of misadventure at the time of the procedure: Secondary | ICD-10-CM | POA: Diagnosis not present

## 2017-09-22 DIAGNOSIS — D500 Iron deficiency anemia secondary to blood loss (chronic): Secondary | ICD-10-CM | POA: Diagnosis not present

## 2017-09-22 DIAGNOSIS — R9431 Abnormal electrocardiogram [ECG] [EKG]: Secondary | ICD-10-CM | POA: Diagnosis not present

## 2017-09-22 DIAGNOSIS — Z833 Family history of diabetes mellitus: Secondary | ICD-10-CM | POA: Diagnosis not present

## 2017-09-22 DIAGNOSIS — E785 Hyperlipidemia, unspecified: Secondary | ICD-10-CM | POA: Diagnosis present

## 2017-09-22 HISTORY — PX: ESOPHAGOGASTRODUODENOSCOPY: SHX5428

## 2017-09-22 HISTORY — PX: SUBMUCOSAL INJECTION: SHX5543

## 2017-09-22 LAB — POCT I-STAT 3, ART BLOOD GAS (G3+)
Acid-base deficit: 2 mmol/L (ref 0.0–2.0)
BICARBONATE: 24.1 mmol/L (ref 20.0–28.0)
O2 Saturation: 97 %
PCO2 ART: 46.8 mmHg (ref 32.0–48.0)
TCO2: 26 mmol/L (ref 22–32)
pH, Arterial: 7.32 — ABNORMAL LOW (ref 7.350–7.450)
pO2, Arterial: 99 mmHg (ref 83.0–108.0)

## 2017-09-22 LAB — PREPARE FRESH FROZEN PLASMA
UNIT DIVISION: 0
UNIT DIVISION: 0
Unit division: 0
Unit division: 0
Unit division: 0

## 2017-09-22 LAB — BASIC METABOLIC PANEL
Anion gap: 7 (ref 5–15)
Anion gap: 9 (ref 5–15)
BUN: 24 mg/dL — ABNORMAL HIGH (ref 6–20)
BUN: 22 mg/dL — AB (ref 6–20)
CO2: 25 mmol/L (ref 22–32)
CO2: 23 mmol/L (ref 22–32)
Calcium: 7.4 mg/dL — ABNORMAL LOW (ref 8.9–10.3)
Calcium: 7.5 mg/dL — ABNORMAL LOW (ref 8.9–10.3)
Chloride: 111 mmol/L (ref 101–111)
Chloride: 111 mmol/L (ref 101–111)
Creatinine, Ser: 0.86 mg/dL (ref 0.44–1.00)
Creatinine, Ser: 0.9 mg/dL (ref 0.44–1.00)
GFR calc Af Amer: >60 mL/min (ref >60)
GFR calc Af Amer: >60 mL/min (ref >60)
GFR calc non Af Amer: >60 mL/min (ref >60)
GFR calc non Af Amer: >60 mL/min (ref >60)
Glucose, Bld: 151 mg/dL — ABNORMAL HIGH (ref 65–99)
Glucose, Bld: 165 mg/dL — ABNORMAL HIGH (ref 65–99)
POTASSIUM: 3.7 mmol/L (ref 3.5–5.1)
Potassium: 4.2 mmol/L (ref 3.5–5.1)
Sodium: 143 mmol/L (ref 135–145)
Sodium: 143 mmol/L (ref 135–145)

## 2017-09-22 LAB — GLUCOSE, CAPILLARY
GLUCOSE-CAPILLARY: 152 mg/dL — AB (ref 65–99)
GLUCOSE-CAPILLARY: 179 mg/dL — AB (ref 65–99)
GLUCOSE-CAPILLARY: 89 mg/dL (ref 65–99)
Glucose-Capillary: 164 mg/dL — ABNORMAL HIGH (ref 65–99)
Glucose-Capillary: 162 mg/dL — ABNORMAL HIGH (ref 65–99)
Glucose-Capillary: 129 mg/dL — ABNORMAL HIGH (ref 65–99)
Glucose-Capillary: 115 mg/dL — ABNORMAL HIGH (ref 65–99)

## 2017-09-22 LAB — BPAM FFP
BLOOD PRODUCT EXPIRATION DATE: 201906132359
BLOOD PRODUCT EXPIRATION DATE: 201906142359
BLOOD PRODUCT EXPIRATION DATE: 201906142359
BLOOD PRODUCT EXPIRATION DATE: 201906142359
Blood Product Expiration Date: 201906132359
Blood Product Expiration Date: 201906142359
Blood Product Expiration Date: 201906142359
Blood Product Expiration Date: 201906142359
ISSUE DATE / TIME: 201906092233
ISSUE DATE / TIME: 201906100837
ISSUE DATE / TIME: 201906100837
ISSUE DATE / TIME: 201906100837
ISSUE DATE / TIME: 201906092233
ISSUE DATE / TIME: 201906100837
ISSUE DATE / TIME: 201906100837
ISSUE DATE / TIME: 201906100837
UNIT TYPE AND RH: 6200
UNIT TYPE AND RH: 6200
UNIT TYPE AND RH: 6200
Unit Type and Rh: 6200
Unit Type and Rh: 6200
Unit Type and Rh: 6200
Unit Type and Rh: 6200
Unit Type and Rh: 6200

## 2017-09-22 LAB — CBC
HCT: 36.4 % (ref 36.0–46.0)
HCT: 34.9 % — ABNORMAL LOW (ref 36.0–46.0)
HEMATOCRIT: 35.9 % — AB (ref 36.0–46.0)
HEMOGLOBIN: 11.7 g/dL — AB (ref 12.0–15.0)
HEMOGLOBIN: 11 g/dL — AB (ref 12.0–15.0)
Hemoglobin: 11.5 g/dL — ABNORMAL LOW (ref 12.0–15.0)
MCH: 27.5 pg (ref 26.0–34.0)
MCH: 27.3 pg (ref 26.0–34.0)
MCH: 27.4 pg (ref 26.0–34.0)
MCHC: 32 g/dL (ref 30.0–36.0)
MCHC: 32.1 g/dL (ref 30.0–36.0)
MCHC: 31.5 g/dL (ref 30.0–36.0)
MCV: 85.9 fL (ref 78.0–100.0)
MCV: 85 fL (ref 78.0–100.0)
MCV: 86.8 fL (ref 78.0–100.0)
PLATELETS: 191 10*3/uL (ref 150–400)
PLATELETS: 196 10*3/uL (ref 150–400)
Platelets: 183 10*3/uL (ref 150–400)
RBC: 4.18 MIL/uL (ref 3.87–5.11)
RBC: 4.28 MIL/uL (ref 3.87–5.11)
RBC: 4.02 MIL/uL (ref 3.87–5.11)
RDW: 17.5 % — AB (ref 11.5–15.5)
RDW: 17.8 % — ABNORMAL HIGH (ref 11.5–15.5)
RDW: 18.1 % — ABNORMAL HIGH (ref 11.5–15.5)
WBC: 14.1 10*3/uL — AB (ref 4.0–10.5)
WBC: 14.2 10*3/uL — ABNORMAL HIGH (ref 4.0–10.5)
WBC: 16.6 10*3/uL — ABNORMAL HIGH (ref 4.0–10.5)

## 2017-09-22 LAB — URINALYSIS, ROUTINE W REFLEX MICROSCOPIC
BILIRUBIN URINE: NEGATIVE
Bacteria, UA: NONE SEEN
Glucose, UA: NEGATIVE mg/dL
Hgb urine dipstick: NEGATIVE
Ketones, ur: NEGATIVE mg/dL
Leukocytes, UA: NEGATIVE
Nitrite: NEGATIVE
PH: 5 (ref 5.0–8.0)
Protein, ur: 30 mg/dL — AB
SPECIFIC GRAVITY, URINE: 1.015 (ref 1.005–1.030)

## 2017-09-22 LAB — PHOSPHORUS: PHOSPHORUS: 4.5 mg/dL (ref 2.5–4.6)

## 2017-09-22 LAB — BPAM CRYOPRECIPITATE
Blood Product Expiration Date: 201906100602
Unit Type and Rh: 6200

## 2017-09-22 LAB — DIC (DISSEMINATED INTRAVASCULAR COAGULATION) PANEL
APTT: 25 s (ref 24–36)
FIBRINOGEN: 318 mg/dL (ref 210–475)
PROTHROMBIN TIME: 17.5 s — AB (ref 11.4–15.2)

## 2017-09-22 LAB — PREPARE CRYOPRECIPITATE: UNIT DIVISION: 0

## 2017-09-22 LAB — DIC (DISSEMINATED INTRAVASCULAR COAGULATION)PANEL
D-Dimer, Quant: 0.41 ug/mL-FEU (ref 0.00–0.50)
INR: 1.45
Platelets: 190 10*3/uL (ref 150–400)
Smear Review: NONE SEEN

## 2017-09-22 LAB — PREPARE PLATELET PHERESIS: UNIT DIVISION: 0

## 2017-09-22 LAB — I-STAT ARTERIAL BLOOD GAS, ED
Acid-base deficit: 2 mmol/L (ref 0.0–2.0)
Bicarbonate: 24.7 mmol/L (ref 20.0–28.0)
O2 Saturation: 99 %
Patient temperature: 98.6
TCO2: 26 mmol/L (ref 22–32)
pCO2 arterial: 52 mmHg — ABNORMAL HIGH (ref 32.0–48.0)
pH, Arterial: 7.285 — ABNORMAL LOW (ref 7.350–7.450)
pO2, Arterial: 175 mmHg — ABNORMAL HIGH (ref 83.0–108.0)

## 2017-09-22 LAB — TROPONIN I
TROPONIN I: 0.04 ng/mL — AB (ref <0.03)
Troponin I: 0.04 ng/mL (ref <0.03)

## 2017-09-22 LAB — BPAM PLATELET PHERESIS
BLOOD PRODUCT EXPIRATION DATE: 201906112359
ISSUE DATE / TIME: 201906092239
UNIT TYPE AND RH: 600

## 2017-09-22 LAB — LACTIC ACID, PLASMA: LACTIC ACID, VENOUS: 1.6 mmol/L (ref 0.5–1.9)

## 2017-09-22 LAB — MASSIVE TRANSFUSION PROTOCOL ORDER (BLOOD BANK NOTIFICATION)

## 2017-09-22 LAB — PROCALCITONIN: Procalcitonin: 0.16 ng/mL

## 2017-09-22 LAB — TRIGLYCERIDES: TRIGLYCERIDES: 276 mg/dL — AB (ref <150)

## 2017-09-22 LAB — MAGNESIUM: Magnesium: 1.7 mg/dL (ref 1.7–2.4)

## 2017-09-22 LAB — ACETAMINOPHEN LEVEL

## 2017-09-22 LAB — PREPARE RBC (CROSSMATCH)

## 2017-09-22 LAB — MRSA PCR SCREENING: MRSA by PCR: NEGATIVE

## 2017-09-22 SURGERY — EGD (ESOPHAGOGASTRODUODENOSCOPY)
Anesthesia: Moderate Sedation

## 2017-09-22 MED ORDER — SODIUM CHLORIDE 0.9 % IV SOLN
INTRAVENOUS | Status: DC
  Administered 2017-09-23: 21:00:00 via INTRAVENOUS

## 2017-09-22 MED ORDER — SODIUM CHLORIDE 0.9 % IV SOLN
250.0000 mL | INTRAVENOUS | Status: DC | PRN
  Administered 2017-09-22: 250 mL via INTRAVENOUS

## 2017-09-22 MED ORDER — CHLORHEXIDINE GLUCONATE 0.12% ORAL RINSE (MEDLINE KIT)
15.0000 mL | Freq: Two times a day (BID) | OROMUCOSAL | Status: DC
  Administered 2017-09-22: 15 mL via OROMUCOSAL

## 2017-09-22 MED ORDER — IPRATROPIUM-ALBUTEROL 0.5-2.5 (3) MG/3ML IN SOLN: 3.0000 mL | Freq: Four times a day (QID) | RESPIRATORY_TRACT | Status: DC | PRN

## 2017-09-22 MED ORDER — IPRATROPIUM-ALBUTEROL 0.5-2.5 (3) MG/3ML IN SOLN
3.0000 mL | Freq: Four times a day (QID) | RESPIRATORY_TRACT | Status: DC
  Administered 2017-09-22 (×2): 3 mL via RESPIRATORY_TRACT
  Filled 2017-09-22 (×2): qty 3

## 2017-09-22 MED ORDER — FENTANYL CITRATE (PF) 100 MCG/2ML IJ SOLN
100.0000 ug | INTRAMUSCULAR | Status: DC | PRN
  Filled 2017-09-22: qty 2

## 2017-09-22 MED ORDER — METOPROLOL TARTRATE 5 MG/5ML IV SOLN: 2.5000 mg | INTRAVENOUS | Status: DC | PRN

## 2017-09-22 MED ORDER — EPINEPHRINE PF 1 MG/10ML IJ SOSY
PREFILLED_SYRINGE | INTRAMUSCULAR | Status: AC
  Filled 2017-09-22: qty 10

## 2017-09-22 MED ORDER — PROPOFOL 1000 MG/100ML IV EMUL
5.0000 ug/kg/min | INTRAVENOUS | Status: DC
  Administered 2017-09-22 (×3): 50 ug/kg/min via INTRAVENOUS
  Filled 2017-09-22 (×3): qty 100

## 2017-09-22 MED ORDER — FENTANYL CITRATE (PF) 100 MCG/2ML IJ SOLN
50.0000 ug | Freq: Once | INTRAMUSCULAR | Status: AC
  Administered 2017-09-22: 50 ug via INTRAVENOUS
  Filled 2017-09-22: qty 2

## 2017-09-22 MED ORDER — FENTANYL CITRATE (PF) 100 MCG/2ML IJ SOLN
100.0000 ug | INTRAMUSCULAR | Status: DC | PRN
  Administered 2017-09-22: 100 ug via INTRAVENOUS

## 2017-09-22 MED ORDER — ORAL CARE MOUTH RINSE
15.0000 mL | Freq: Two times a day (BID) | OROMUCOSAL | Status: DC
  Administered 2017-09-22: 15 mL via OROMUCOSAL

## 2017-09-22 MED ORDER — FENTANYL 2500MCG IN NS 250ML (10MCG/ML) PREMIX INFUSION
25.0000 ug/h | INTRAVENOUS | Status: DC
  Administered 2017-09-22: 50 ug/h via INTRAVENOUS
  Filled 2017-09-22: qty 250

## 2017-09-22 MED ORDER — ORAL CARE MOUTH RINSE
15.0000 mL | OROMUCOSAL | Status: DC
  Administered 2017-09-22 (×4): 15 mL via OROMUCOSAL

## 2017-09-22 MED ORDER — MIDAZOLAM HCL 2 MG/2ML IJ SOLN
INTRAMUSCULAR | Status: AC
  Filled 2017-09-22: qty 2

## 2017-09-22 MED ORDER — FENTANYL BOLUS VIA INFUSION
50.0000 ug | INTRAVENOUS | Status: DC | PRN
  Administered 2017-09-22 (×2): 50 ug via INTRAVENOUS
  Filled 2017-09-22: qty 50

## 2017-09-22 MED ORDER — PROPOFOL 1000 MG/100ML IV EMUL
INTRAVENOUS | Status: AC
  Administered 2017-09-22: 01:00:00
  Filled 2017-09-22: qty 100

## 2017-09-22 MED ORDER — FUROSEMIDE 10 MG/ML IJ SOLN
40.0000 mg | Freq: Once | INTRAMUSCULAR | Status: AC
  Administered 2017-09-22: 40 mg via INTRAVENOUS
  Filled 2017-09-22: qty 4

## 2017-09-22 MED ORDER — SODIUM CHLORIDE 0.9 % IJ SOLN
PREFILLED_SYRINGE | INTRAMUSCULAR | Status: DC | PRN
  Administered 2017-09-22: 6 mL

## 2017-09-22 MED ORDER — MIDAZOLAM HCL 2 MG/2ML IJ SOLN
2.0000 mg | Freq: Once | INTRAMUSCULAR | Status: AC
  Administered 2017-09-22: 2 mg via INTRAVENOUS

## 2017-09-22 NOTE — Progress Notes (Signed)
Pt. UOP around 48mls/ hr. Dr. Nelda Marseille notified and ordered to just monitor for now.

## 2017-09-22 NOTE — Progress Notes (Signed)
Pt. had an episode of ventricular bigeminy from 1017-1020 during bedside EGD. Noe Gens, NP at bedside and aware.

## 2017-09-22 NOTE — Progress Notes (Signed)
Wasted 153ml of fentanyl in the sink with Delorise Jackson, RN.     Confirmation of waste. -Delorise Jackson, RN

## 2017-09-22 NOTE — Progress Notes (Signed)
Pt. extubated. Lungs sounds clear and diminished in the bases before and after extubation. Mouth care performed prior to ETT being removed. Pt. A&Ox4.

## 2017-09-22 NOTE — Progress Notes (Signed)
Pt.'s husband called and stated a different last name. He then brought pt.'s ID as well as his own and gave proof that they had the same last name and were indeed married.

## 2017-09-22 NOTE — Op Note (Signed)
[facility] Patient Name: Peggy House Procedure Date : 09/22/2017 MRN: [id number] Attending MD: Clarene Essex , MD Date of Birth: 01/11/1961 CSN: [id number] Age: 57 Admit Type: Inpatient Procedure:                Upper GI endoscopy Indications:              Hematemesis Providers:                Clarene Essex, MD, Baird Cancer, RN, Cletis [place],                            Technician Referring MD:              Medicines:                Propofol fentanyl and Versed per ICU team Complications:            No immediate complications. Estimated Blood Loss:     Estimated blood loss: none. Procedure:                Pre-Anesthesia Assessment:                           - Prior to the procedure, a History and Physical                            was performed, and patient medications and                            allergies were reviewed. The patient's tolerance of                            previous anesthesia was also reviewed. The risks                            and benefits of the procedure and the sedation                            options and risks were discussed with the patient.                            All questions were answered, and informed consent                            was obtained. Prior Anticoagulants: The patient has                            taken no previous anticoagulant or antiplatelet                            agents. ASA Grade Assessment: III - A patient with                            severe systemic disease. After reviewing the risks  and benefits, the patient was deemed in                            satisfactory condition to undergo the procedure.                           After obtaining informed consent, the endoscope was                            passed under direct vision. Throughout the                            procedure, the patient's blood pressure, pulse, and                            oxygen saturations were  monitored continuously. The                            EG-[id number] ([id number]) scope was introduced through the                            mouth, and advanced to the second part of duodenum.                            The upper GI endoscopy was accomplished without                            difficulty. The patient tolerated the procedure                            well. Scope In: Scope Out: Findings:      A tiny hiatal hernia was present.      Hematin (altered blood/coffee-ground-like material) was found in the       cardia, in the gastric fundus and on the lesser curvature of the       stomach.this area was washed and suctioned but we could not remove all       of the old blood so an underlying lesion is possible      The greater curvature of the stomach, gastric antrum, prepyloric region       of the stomach and pylorus were normal except for some minimal       superficial oral gastric tube suction marks and specifically no obvious       AVMs were seen.      One oozing cratered duodenal ulcer was found in the duodenal bulb. To       stop active bleeding, two hemostatic clips were successfully placed.       Area was successfully injected with 6 mL of a 1:10,000 solution of       epinephrine for hemostasis over 3 injections each with cessation of       bleeding.      The second portion of the duodenum was normal.      The exam was otherwise without abnormality. Impression:               - Tiny hiatal hernia.                           -  Hematin (altered blood/coffee-ground-like                            material) in the lesser curvature of the stomach,                            in the cardia and in the gastric fundus.                           - Normal greater curvature of the stomach, antrum,                            prepyloric region of the stomach and pylorus.                           - One oozing duodenal ulcer. Clips were placed.                            Injected. no active  bleeding at the end of the                            procedure                           - Normal second portion of the duodenum.                           - The examination was otherwise normal.                           - No specimens collected. Recommendation:           - Clear liquid diet When extubated and doing okay.                           - Continue present medications. Will follow and                            case discussed with ICU team and okay to leave                            suction tube out and when medically stable wean                            from the ventilator from a bleeding standpoint                           - Return to GI clinic in 4 weeks. continue pump                            inhibitors                           - Telephone GI clinic if symptomatic PRN. Procedure Code(s):        ---  Professional ---                           [phone], Esophagogastroduodenoscopy, flexible,                            transoral; with control of bleeding, any method Diagnosis Code(s):        --- Professional ---                           K44.9, Diaphragmatic hernia without obstruction or                            gangrene                           K92.2, Gastrointestinal hemorrhage, unspecified                           K26.4, Chronic or unspecified duodenal ulcer with                            hemorrhage                           K92.0, Hematemesis CPT copyright 2017 [employer]. All rights reserved. The codes documented in this report are preliminary and upon coder review may  be revised to meet current compliance requirements. Clarene Essex, MD 09/22/2017 11:32:09 AM This report has been signed electronically. Number of Addenda: 0

## 2017-09-22 NOTE — Procedures (Signed)
Extubation Procedure Note  Patient Details:   Name: Peggy House DOB: 08-25-1960 MRN: [id number]   Airway Documentation:    Vent end date: 09/22/17 Vent end time: 1417   Evaluation  O2 sats: stable throughout Complications: No apparent complications Patient did tolerate procedure well. Bilateral Breath Sounds: Clear, Diminished   Yes   Pt extubated to 2L N/C.  No stridor noted.  RN @ bedside.  Donnetta Hail 09/22/2017, 2:17 PM

## 2017-09-22 NOTE — ED Notes (Signed)
Family at bedside. 

## 2017-09-22 NOTE — Progress Notes (Addendum)
PULMONARY / CRITICAL CARE MEDICINE   Name: Peggy House MRN: [id number] DOB: 04-28-1960    ADMISSION DATE:  09/21/2017 CONSULTATION DATE:  09/22/2017  REFERRING MD:  Dr. 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 Husband states that patient has been weak since Tuesday, however this afternoon she started vomiting dark blood. On arrival to ED patient experienced episodes of bradycardia thought to be secondary to vagal episode. Hemoglobin 3.9. Intubated. Given 8 units RBC and 2 units FFP. Hemoglobin now 11.1. GI consulted. PCCM asked to admit.  Husband states that patient has been taking increased amounts of tylenol over the past week.   SUBJECTIVE:  Tmas 97.9, WBC 14.1.  I/O - neg 597 in last 24 hours.  Scoped per Dr. Watt Climes.  VITAL SIGNS: BP (!) 154/83   Pulse 75   Temp 97.9 F (36.6 C) (Oral)   Resp 17   Ht 5\' 4"  (1.626 m)   Wt 264 lb 15.9 oz (120.2 kg)   SpO2 95%   BMI 45.49 kg/m   HEMODYNAMICS:    VENTILATOR SETTINGS: Vent Mode: PRVC FiO2 (%):  [40 %-60 %] 40 % Set Rate:  [14 bmp-18 bmp] 18 bmp Vt Set:  [440 mL] 440 mL PEEP:  [5 cmH20] 5 cmH20 Pressure Support:  [10 cmH20] 10 cmH20 Plateau Pressure:  [17 cmH20-24 cmH20] 17 cmH20  INTAKE / OUTPUT: I/O last 3 completed shifts: In: 1035.7 [I.V.:935.7; IV Piggyback:100] Out: 1633 [Urine:1133; Emesis/NG output:500]  PHYSICAL EXAMINATION: General: adult female in NAD, sedate on vent HEENT: MM pink/moist, ETT Neuro: sedate, MAE spontaneously with stimulation  CV: s1s2 rrr, ? SEM / flow murmur?, bigeminy during procedure, back to ST  PULM: even/non-labored, lungs bilaterally clear GI: soft, non-tender, bsx4 active  Extremities: warm/dry, no edema  Skin: no rashes or lesions  LABS:  BMET Recent Labs   Lab 09/21/17 2148 09/21/17 2242 09/22/17 0117 09/22/17 0328  NA 140 144 143 143  K 3.5 3.9 4.2 3.7  CL 106 105 111 111  CO2 25  --  25 23  BUN 25* 27* 24* 22*  CREATININE 0.92 0.80 0.86 0.90  GLUCOSE 123* 117* 151* 165*    Electrolytes Recent Labs  Lab 09/21/17 2148 09/22/17 0117 09/22/17 0328  CALCIUM 8.0* 7.4* 7.5*  MG  --  1.7  --   PHOS  --  4.5  --     CBC Recent Labs  Lab 09/21/17 2148 09/21/17 2242 09/21/17 2322 09/21/17 2324 09/22/17 0328  WBC 18.5*  --   --  17.0* 14.1*  HGB 3.9* 5.4*  --  11.1* 11.5*  HCT 14.4* 16.0*  --  36.1 35.9*  PLT 333  --  190 210 191    Coag's Recent Labs  Lab 09/21/17 2322  APTT 25  INR 1.45    Sepsis Markers Recent Labs  Lab 09/22/17 0328  LATICACIDVEN 1.6  PROCALCITON 0.16    ABG Recent Labs  Lab 09/22/17 0040 09/22/17 0431  PHART 7.285* 7.320*  PCO2ART 52.0* 46.8  PO2ART 175.0* 99.0    Liver Enzymes Recent Labs  Lab 09/21/17 2148  AST 13*  ALT 8*  ALKPHOS 67  BILITOT 0.4  ALBUMIN 2.6*    Cardiac Enzymes Recent Labs  Lab 09/22/17 0117 09/22/17 6767  TROPONINI <0.03 0.04*    Glucose Recent Labs  Lab 09/22/17 0252 09/22/17 0412 09/22/17 0730  GLUCAP 152* 164* 162*    Imaging Dg Chest Port 1 View  Result Date: 09/21/2017 CLINICAL DATA:  Endotracheal and gastric tube placement. Hematemesis. EXAM: PORTABLE CHEST 1 VIEW COMPARISON:  04/09/2017 FINDINGS: AP portable semi upright view. Endotracheal tube tip is 2 cm above the carina. Gastric tube tip and side-port are coiled within the proximal stomach. There is cardiomegaly with minimal atelectasis at the lung bases. No alveolar consolidation or CHF. No effusion or pneumothorax. No acute osseous abnormality. IMPRESSION: 1. Cardiomegaly with bibasilar atelectasis. No active pulmonary disease. 2. Tip of endotracheal tube approximately 2 cm above the carina. Gastric tube extends into the stomach. Electronically Signed   By: Ashley Royalty M.D.    On: 09/21/2017 23:06    STUDIES:  CXR 6/09 > 1. Cardiomegaly with bibasilar atelectasis. No active pulmonary disease. 2. Tip of endotracheal tube approximately 2 cm above the carina. Gastric tube extends into the stomach.  CULTURES:   ANTIBIOTICS:    SIGNIFICANT EVENTS: 6/09  Presents to ED with GIB 6/10  EGD > small bleeding ulcer in duodenum, clipped x2 + epi  LINES/TUBES: ETT 6/09 >>  DISCUSSION: 57 year old female with known AV Malformation presents to ED with hemoglobin 3.9 with hematemesis. Intubated for airway protection. Given 8 units RBC and 2 units Plasma.    ASSESSMENT / PLAN:  PULMONARY A: Respiratory Insufficieny in setting of hematemesis  H/O Tobacco Use (Pack per Day smoker last 40 years)  P:   PRVC with SBT / hopeful for extubation 6/11  OK with GI to extubate once alert  Duoneb  Follow intermittent CXR   CARDIOLOGY A: NSTEMI in setting of profound anemia/hypoprofusion ? 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  H/O GERD P:   GI following, appreciate input  Protonix gtt per GI  Reduce H/H to Q8  NPO while intubated, ok for clears post extubation if meets criteria   HEMATOLOGIC  A: Acute Blood Loss Anemia - s/p 8 units RBC and 2 units FFP  Chronic Iron Deficiency Anemia  P:  H/H Q8 Monitor in ICU  Hold all anticoagulation  SCD's for DVT prophylaxis   INFECTIOUS A: Leukocytosis - likely reactive  P:   Trend WBC, fever curve, PCT    NEURO A: Sedation Needs H/O Major Depressive Disorder  P:   RASS goal: 0 to -1  WUA / SBT this am  Hold home zoloft for now Versed 2mg  IV x1 for EGD   FAMILY  - Updates: Husband updated am 6/10 post EGD on NP rounds.   -  Inter-disciplinary family meet or Palliative Care meeting due by: 09/29/2017    CC Time:  30 minutes.  See H&P from 6/10 early am.   Noe Gens, NP-C [facility] Pgr: [phone] or if no answer [phone] 09/22/2017, 9:57 AM  Attending Note:  57 year old female with history of GI AVMs who presents to PCCM with respiratory failure after noting that her Hg was 3, confused and hypotensive.  On exam, after EGD she is awake and following commands, weaning.  I reviewed CXR myself,  ETT is in good position.  Discussed with PCCM-NP.  Will wean to extubate now that EGD is complete and H&H are stable.  D/C pressors.  IVF resuscitation.  Protonix drip.  Eat clear, no NGT and no solid diet.  Will transfer to SDU and to [facility] with PCCM off 6/11.  The patient is critically ill with multiple organ systems failure and requires high complexity decision making for assessment and support, frequent evaluation and titration of therapies, application of advanced monitoring technologies and extensive interpretation of multiple databases.   Critical Care Time devoted to patient care services described in this note is  45  Minutes. This time reflects time of care of this signee Dr Jennet Maduro. This critical care time does not reflect procedure time, or teaching time or supervisory time of PA/NP/Med student/Med Resident etc but could involve care discussion time.  Rush Farmer, M.D. [facility]. Pager: [phone]. After hours pager: [phone].

## 2017-09-22 NOTE — Consult Note (Addendum)
Reason for Consult:Upper GI bleeding Referring Physician: ER physician  Peggy House is an 57 y.o. female.  HPI: patient seen and examined and discussed last night with Dr. Jeanell Sparrow ER physician and her hospital computer chart was reviewed as well as her last endoscopy and she is now intubated and sedated but stable after multiple transfusions  Past Medical History:  Diagnosis Date  . Anxiety   . Arthritis    knnes,back  . GERD (gastroesophageal reflux disease)   . History of swelling of feet   . Hyperlipidemia   . Hypertension   . Major depressive disorder, recurrent episode ([place]) 06/05/2015  . Obesity   . Snores     Past Surgical History:  Procedure Laterality Date  . ABDOMINAL HYSTERECTOMY    . CARPAL TUNNEL RELEASE  05/13/2011   Procedure: CARPAL TUNNEL RELEASE;  Surgeon: Nita Sells, MD;  Location: [facility];  Service: Orthopedics;  Laterality: Left;  . COLONOSCOPY WITH PROPOFOL N/A 04/28/2014   Procedure: COLONOSCOPY WITH PROPOFOL;  Surgeon: Cleotis Nipper, MD;  Location: [facility] ENDOSCOPY;  Service: Endoscopy;  Laterality: N/A;  . DG TOES*L*  2/10   rt  . DILATION AND CURETTAGE OF UTERUS    . ESOPHAGOGASTRODUODENOSCOPY N/A 04/10/2014   Procedure: ESOPHAGOGASTRODUODENOSCOPY (EGD);  Surgeon: [name], MD;  Location: [facility] ENDOSCOPY;  Service: Endoscopy;  Laterality: N/A;  . ESOPHAGOGASTRODUODENOSCOPY N/A 05/10/2017   Procedure: ESOPHAGOGASTRODUODENOSCOPY (EGD);  Surgeon: Ronald Lobo, MD;  Location: [facility] ENDOSCOPY;  Service: Endoscopy;  Laterality: N/A;  . ESOPHAGOGASTRODUODENOSCOPY (EGD) WITH PROPOFOL N/A 04/27/2014   Procedure: ESOPHAGOGASTRODUODENOSCOPY (EGD) WITH PROPOFOL;  Surgeon: Cleotis Nipper, MD;  Location: [facility];  Service: Endoscopy;  Laterality: N/A;  possible apc  . HOT HEMOSTASIS N/A 04/27/2014   Procedure: HOT HEMOSTASIS (ARGON PLASMA COAGULATION/BICAP);  Surgeon: Cleotis Nipper, MD;  Location: [facility] ENDOSCOPY;  Service:  Endoscopy;  Laterality: N/A;  . L shoulder Surgery  2011    Family History  Problem Relation Age of Onset  . Cancer Mother        Ovarian  . Ovarian cancer Mother   . Diabetes Mother   . Kidney disease Mother   . Bleeding Disorder Mother   . Diabetes type II Sister   . Bleeding Disorder Sister   . Diabetes Sister   . Colon cancer Maternal Grandfather   . Crohn's disease Maternal Grandfather   . Stomach cancer Maternal Grandmother   . Kidney disease Son   . Bleeding Disorder Son   . Dysmenorrhea Neg Hx     Social History:  reports that she has been smoking cigarettes.  She has a 15.00 pack-year smoking history. She quit smokeless tobacco use about 38 years ago. Her smokeless tobacco use included snuff. She reports that she does not drink alcohol or use drugs.  Allergies:  Allergies  Allergen Reactions  . Nsaids Other (See Comments)    Stomach bleeding episodes; Tylenol is OK  . Tomato Hives    Medications: I have reviewed the patient's current medications.  Results for orders placed or performed during the hospital encounter of 09/21/17 (from the past 48 hour(s))  Comprehensive metabolic panel     Status: Abnormal   Collection Time: 09/21/17  9:48 PM  Result Value Ref Range   Sodium 140 135 - 145 mmol/L   Potassium 3.5 3.5 - 5.1 mmol/L   Chloride 106 101 - 111 mmol/L   CO2 25 22 - 32 mmol/L   Glucose, Bld 123 (H) 65 -  99 mg/dL   BUN 25 (H) 6 - 20 mg/dL   Creatinine, Ser 0.92 0.44 - 1.00 mg/dL   Calcium 8.0 (L) 8.9 - 10.3 mg/dL   Total Protein 5.2 (L) 6.5 - 8.1 g/dL   Albumin 2.6 (L) 3.5 - 5.0 g/dL   AST 13 (L) 15 - 41 U/L   ALT 8 (L) 14 - 54 U/L   Alkaline Phosphatase 67 38 - 126 U/L   Total Bilirubin 0.4 0.3 - 1.2 mg/dL   GFR calc non Af Amer >60 >60 mL/min   GFR calc Af Amer >60 >60 mL/min    Comment: (NOTE) The eGFR has been calculated using the CKD EPI equation. This calculation has not been validated in all clinical situations. eGFR's persistently <60  mL/min signify possible Chronic Kidney Disease.    Anion gap 9 5 - 15    Comment: Performed at [facility] [address] [place], [place] [address]  CBC     Status: Abnormal   Collection Time: 09/21/17  9:48 PM  Result Value Ref Range   WBC 18.5 (H) 4.0 - 10.5 K/uL   RBC 1.87 (L) 3.87 - 5.11 MIL/uL   Hemoglobin 3.9 (LL) 12.0 - 15.0 g/dL    Comment: REPEATED TO VERIFY CRITICAL RESULT CALLED TO, READ BACK BY AND VERIFIED WITH: B. OLDLAND,RN 2215 09/21/2017 T. TYSOR    HCT 14.4 (L) 36.0 - 46.0 %   MCV 77.0 (L) 78.0 - 100.0 fL   MCH 20.9 (L) 26.0 - 34.0 pg   MCHC 27.1 (L) 30.0 - 36.0 g/dL   RDW 25.2 (H) 11.5 - 15.5 %   Platelets 333 150 - 400 K/uL    Comment: Performed at [facility], [place] [address], [place], [place] [address]  Type and screen [place]     Status: None (Preliminary result)   Collection Time: 09/21/17  9:48 PM  Result Value Ref Range   ABO/RH(D) A NEG    Antibody Screen NEG    Sample Expiration 09/24/2017    Unit Number [id number]    Blood Component Type RED CELLS,LR    Unit division 00    Status of Unit ISSUED,FINAL    Unit tag comment VERBAL ORDERS PER DR RAY    Transfusion Status OK TO TRANSFUSE    Crossmatch Result COMPATIBLE    Unit Number [id number]    Blood Component Type RED CELLS,LR    Unit division 00    Status of Unit ISSUED,FINAL    Unit tag comment VERBAL ORDERS PER DR RAY    Transfusion Status OK TO TRANSFUSE    Crossmatch Result COMPATIBLE    Unit Number [id number]    Blood Component Type RBC LR PHER2    Unit division 00    Status of Unit ISSUED,FINAL    Unit tag comment VERBAL ORDERS PER DR RAY    Transfusion Status OK TO TRANSFUSE    Crossmatch Result COMPATIBLE    Unit Number [id number]    Blood Component Type RED CELLS,LR    Unit division 00    Status of Unit ISSUED,FINAL    Unit tag comment VERBAL ORDERS PER DR RAY    Transfusion Status OK TO TRANSFUSE    Crossmatch Result  COMPATIBLE    Unit Number [id number]    Blood Component Type RED CELLS,LR    Unit division 00    Status of Unit ISSUED,FINAL    Unit tag comment VERBAL ORDERS PER DR RAY  Transfusion Status OK TO TRANSFUSE    Crossmatch Result COMPATIBLE    Unit Number [id number]    Blood Component Type RED CELLS,LR    Unit division 00    Status of Unit ISSUED,FINAL    Unit tag comment VERBAL ORDERS PER DR RAY    Transfusion Status OK TO TRANSFUSE    Crossmatch Result COMPATIBLE    Unit Number [id number]    Blood Component Type RED CELLS,LR    Unit division 00    Status of Unit ISSUED,FINAL    Unit tag comment VERBAL ORDERS PER DR RAY    Transfusion Status OK TO TRANSFUSE    Crossmatch Result COMPATIBLE    Unit Number [id number]    Blood Component Type RED CELLS,LR    Unit division 00    Status of Unit ISSUED,FINAL    Unit tag comment VERBAL ORDERS PER DR RAY    Transfusion Status OK TO TRANSFUSE    Crossmatch Result COMPATIBLE    Unit Number [id number]    Blood Component Type RBC, LR IRR    Unit division 00    Status of Unit REL FROM [facility]    Unit tag comment VERBAL ORDERS PER DR RAY    Transfusion Status OK TO TRANSFUSE    Crossmatch Result COMPATIBLE    Unit Number [id number]    Blood Component Type RED CELLS,LR    Unit division 00    Status of Unit ALLOCATED    Unit tag comment VERBAL ORDERS PER DR RAY    Transfusion Status OK TO TRANSFUSE    Crossmatch Result COMPATIBLE    Unit Number [id number]    Blood Component Type RBC LR PHER2    Unit division 00    Status of Unit ALLOCATED    Unit tag comment VERBAL ORDERS PER DR RAY    Transfusion Status OK TO TRANSFUSE    Crossmatch Result COMPATIBLE    Unit Number [id number]    Blood Component Type RED CELLS,LR    Unit division 00    Status of Unit REL FROM [facility]    Unit tag comment VERBAL ORDERS PER DR RAY    Transfusion Status OK TO TRANSFUSE    Crossmatch Result COMPATIBLE    Unit Number [id number]     Blood Component Type RBC LR PHER2    Unit division 00    Status of Unit ALLOCATED    Transfusion Status OK TO TRANSFUSE    Crossmatch Result COMPATIBLE    Unit tag comment VERBAL ORDERS PER DR PATRICK    Unit Number [id number]    Blood Component Type RBC LR PHER2    Unit division 00    Status of Unit ALLOCATED    Transfusion Status OK TO TRANSFUSE    Crossmatch Result COMPATIBLE    Unit tag comment VERBAL ORDERS PER DR PATRICK   Lipase, blood     Status: None   Collection Time: 09/21/17  9:48 PM  Result Value Ref Range   Lipase 23 11 - 51 U/L    Comment: Performed at [facility], [address], [place], [place] [address]  Initiate MTP (Blood Bank Notification)     Status: None   Collection Time: 09/21/17 10:14 PM  Result Value Ref Range   Initiate Massive Transfusion Protocol      MTP ORDER RECEIVED Performed at [facility], [place] [address], [place], [place] [address]   Prepare fresh frozen plasma     Status: None  Collection Time: 09/21/17 10:16 PM  Result Value Ref Range   Unit Number [id number]    Blood Component Type THAWED PLASMA    Unit division 00    Status of Unit ISSUED,FINAL    Transfusion Status      OK TO TRANSFUSE Performed at [facility] [address] [place], [place] [address]    Unit Number [id number]    Blood Component Type THAWED PLASMA    Unit division 00    Status of Unit ISSUED,FINAL    Transfusion Status OK TO TRANSFUSE    Unit Number [id number]    Blood Component Type THAWED PLASMA    Unit division 00    Status of Unit REL FROM [facility]    Transfusion Status OK TO TRANSFUSE    Unit Number [id number]    Blood Component Type THW PLS APHR    Unit division 00    Status of Unit REL FROM [facility]    Transfusion Status OK TO TRANSFUSE    Unit Number [id number]    Blood Component Type THW PLS APHR    Unit division A0    Status of Unit REL FROM [facility]    Transfusion Status OK TO TRANSFUSE    Unit Number  [id number]    Blood Component Type THW PLS APHR    Unit division 00    Status of Unit REL FROM [facility]    Transfusion Status OK TO TRANSFUSE    Unit Number [id number]    Blood Component Type THW PLS APHR    Unit division A0    Status of Unit REL FROM [facility]    Transfusion Status OK TO TRANSFUSE    Unit Number [id number]    Blood Component Type THW PLS APHR    Unit division A0    Status of Unit REL FROM [facility]    Transfusion Status OK TO TRANSFUSE   Prepare RBC     Status: None   Collection Time: 09/21/17 10:27 PM  Result Value Ref Range   Order Confirmation      ORDER PROCESSED BY BLOOD BANK Performed at [facility], [place] [address] [place], [place] [address]   Prepare platelet pheresis     Status: None   Collection Time: 09/21/17 10:32 PM  Result Value Ref Range   Unit Number [id number]    Blood Component Type PLTPHER LR1    Unit division 00    Status of Unit DISCARDED    Transfusion Status      OK TO TRANSFUSE Performed at [facility], [place] [address] [place], [place] [address]   I-Stat beta hCG blood, ED     Status: None   Collection Time: 09/21/17 10:39 PM  Result Value Ref Range   I-stat hCG, quantitative <5.0 <5 mIU/mL   Comment 3            Comment:   GEST. AGE      CONC.  (mIU/mL)   <=1 WEEK        5 - 50     2 WEEKS       50 - 500     3 WEEKS       100 - 10,000     4 WEEKS     1,000 - 30,000        FEMALE AND NON-PREGNANT FEMALE:     LESS THAN 5 mIU/mL   I-Stat Chem 8, ED     Status: Abnormal  Collection Time: 09/21/17 10:42 PM  Result Value Ref Range   Sodium 144 135 - 145 mmol/L   Potassium 3.9 3.5 - 5.1 mmol/L   Chloride 105 101 - 111 mmol/L   BUN 27 (H) 6 - 20 mg/dL   Creatinine, Ser 0.80 0.44 - 1.00 mg/dL   Glucose, Bld 117 (H) 65 - 99 mg/dL   Calcium, Ion 1.12 (L) 1.15 - 1.40 mmol/L   TCO2 25 22 - 32 mmol/L   Hemoglobin 5.4 (LL) 12.0 - 15.0 g/dL   HCT 16.0 (L) 36.0 - 46.0 %   Comment NOTIFIED PHYSICIAN   Ammonia      Status: Abnormal   Collection Time: 09/21/17 11:21 PM  Result Value Ref Range   Ammonia 90 (H) 9 - 35 umol/L    Comment: Performed at [facility], [address] [place], [place] [address]  DIC (disseminated intravasc coag) panel (STAT)     Status: Abnormal   Collection Time: 09/21/17 11:22 PM  Result Value Ref Range   Prothrombin Time 17.5 (H) 11.4 - 15.2 seconds   INR 1.45    aPTT 25 24 - 36 seconds   Fibrinogen 318 210 - 475 mg/dL   D-Dimer, Quant 0.41 0.00 - 0.50 ug/mL-FEU    Comment: (NOTE) At the manufacturer cut-off of 0.50 ug/mL FEU, this assay has been documented to exclude PE with a sensitivity and negative predictive value of 97 to 99%.  At this time, this assay has not been approved by the FDA to exclude DVT/VTE. Results should be correlated with clinical presentation.    Platelets 190 150 - 400 K/uL   Smear Review NO SCHISTOCYTES SEEN     Comment: Performed at [facility], [place] [address], [place], [place] [address]  CBC     Status: Abnormal   Collection Time: 09/21/17 11:24 PM  Result Value Ref Range   WBC 17.0 (H) 4.0 - 10.5 K/uL   RBC 4.05 3.87 - 5.11 MIL/uL   Hemoglobin 11.1 (L) 12.0 - 15.0 g/dL    Comment: DELTA CHECK NOTED POST TRANSFUSION SPECIMEN    HCT 36.1 36.0 - 46.0 %   MCV 89.1 78.0 - 100.0 fL    Comment: DELTA CHECK NOTED POST TRANSFUSION SPECIMEN    MCH 27.4 26.0 - 34.0 pg   MCHC 30.7 30.0 - 36.0 g/dL   RDW 17.4 (H) 11.5 - 15.5 %   Platelets 210 150 - 400 K/uL    Comment: Performed at [facility], [place] [address], [place], [place] [address]  Prepare cryoprecipitate     Status: None (Preliminary result)   Collection Time: 09/21/17 11:52 PM  Result Value Ref Range   Unit Number [id number]    Blood Component Type CRYPOOL THAW    Unit division 00    Status of Unit EXPIRED/DESTROYED    Transfusion Status      OK TO TRANSFUSE Performed at [facility] [address] [place], [place] [address]   Urinalysis,  Routine w reflex microscopic     Status: Abnormal   Collection Time: 09/22/17 12:07 AM  Result Value Ref Range   Color, Urine YELLOW YELLOW   APPearance CLEAR CLEAR   Specific Gravity, Urine 1.015 1.005 - 1.030   pH 5.0 5.0 - 8.0   Glucose, UA NEGATIVE NEGATIVE mg/dL   Hgb urine dipstick NEGATIVE NEGATIVE   Bilirubin Urine NEGATIVE NEGATIVE   Ketones, ur NEGATIVE NEGATIVE mg/dL   Protein, ur 30 (A)  NEGATIVE mg/dL   Nitrite NEGATIVE NEGATIVE   Leukocytes, UA NEGATIVE NEGATIVE   RBC / HPF 0-5 0 - 5 RBC/hpf   WBC, UA 0-5 0 - 5 WBC/hpf   Bacteria, UA NONE SEEN NONE SEEN   Squamous Epithelial / LPF 0-5 0 - 5   Mucus PRESENT    Hyaline Casts, UA PRESENT     Comment: Performed at [facility], [place] [address], [place], [place] [address]  I-Stat arterial blood gas, ED     Status: Abnormal   Collection Time: 09/22/17 12:40 AM  Result Value Ref Range   pH, Arterial 7.285 (L) 7.350 - 7.450   pCO2 arterial 52.0 (H) 32.0 - 48.0 mmHg   pO2, Arterial 175.0 (H) 83.0 - 108.0 mmHg   Bicarbonate 24.7 20.0 - 28.0 mmol/L   TCO2 26 22 - 32 mmol/L   O2 Saturation 99.0 %   Acid-base deficit 2.0 0.0 - 2.0 mmol/L   Patient temperature 98.6 F    Collection site RADIAL, ALLEN'S TEST ACCEPTABLE    Drawn by RT    Sample type ARTERIAL   Basic metabolic panel     Status: Abnormal   Collection Time: 09/22/17  1:17 AM  Result Value Ref Range   Sodium 143 135 - 145 mmol/L   Potassium 4.2 3.5 - 5.1 mmol/L   Chloride 111 101 - 111 mmol/L   CO2 25 22 - 32 mmol/L   Glucose, Bld 151 (H) 65 - 99 mg/dL   BUN 24 (H) 6 - 20 mg/dL   Creatinine, Ser 0.86 0.44 - 1.00 mg/dL   Calcium 7.4 (L) 8.9 - 10.3 mg/dL   GFR calc non Af Amer >60 >60 mL/min   GFR calc Af Amer >60 >60 mL/min    Comment: (NOTE) The eGFR has been calculated using the CKD EPI equation. This calculation has not been validated in all clinical situations. eGFR's persistently <60 mL/min signify possible Chronic Kidney Disease.    Anion gap 7  5 - 15    Comment: Performed at [facility] [address] [place], [place] [address]  Magnesium     Status: None   Collection Time: 09/22/17  1:17 AM  Result Value Ref Range   Magnesium 1.7 1.7 - 2.4 mg/dL    Comment: Performed at [facility] [address] [place], [place] [address]  Phosphorus     Status: None   Collection Time: 09/22/17  1:17 AM  Result Value Ref Range   Phosphorus 4.5 2.5 - 4.6 mg/dL    Comment: Performed at [facility] [address] [place], [place] [address]  Troponin I (q 6hr x 3)     Status: None   Collection Time: 09/22/17  1:17 AM  Result Value Ref Range   Troponin I <0.03 <0.03 ng/mL    Comment: Performed at [facility] [address] [place], [place] [address]  Acetaminophen level     Status: Abnormal   Collection Time: 09/22/17  1:24 AM  Result Value Ref Range   Acetaminophen (Tylenol), Serum <10 (L) 10 - 30 ug/mL    Comment: (NOTE) Therapeutic concentrations vary significantly. A range of 10-30 ug/mL  may be an effective concentration for many patients. However, some  are best treated at concentrations outside of this range. Acetaminophen concentrations >150 ug/mL at 4 hours after ingestion  and >50 ug/mL at 12 hours after ingestion are often associated with  toxic reactions. Performed at  [facility], [place] [address], [place], [place] [address]   Glucose, capillary     Status: Abnormal   Collection Time: 09/22/17  2:52 AM  Result Value Ref Range   Glucose-Capillary 152 (H) 65 - 99 mg/dL   Comment 1 Notify RN   MRSA PCR Screening     Status: None   Collection Time: 09/22/17  2:56 AM  Result Value Ref Range   MRSA by PCR NEGATIVE NEGATIVE    Comment:        The GeneXpert MRSA Assay (FDA approved for NASAL specimens only), is one component of a comprehensive MRSA colonization surveillance program. It is not intended to diagnose MRSA infection nor to guide or monitor treatment for MRSA  infections. Performed at [facility], [place] [address], [place], [place] [address]   CBC     Status: Abnormal   Collection Time: 09/22/17  3:28 AM  Result Value Ref Range   WBC 14.1 (H) 4.0 - 10.5 K/uL   RBC 4.18 3.87 - 5.11 MIL/uL   Hemoglobin 11.5 (L) 12.0 - 15.0 g/dL   HCT 35.9 (L) 36.0 - 46.0 %   MCV 85.9 78.0 - 100.0 fL   MCH 27.5 26.0 - 34.0 pg   MCHC 32.0 30.0 - 36.0 g/dL   RDW 17.5 (H) 11.5 - 15.5 %   Platelets 191 150 - 400 K/uL    Comment: Performed at [facility], [place] [address] [place], [place] [address]  Lactic acid, plasma     Status: None   Collection Time: 09/22/17  3:28 AM  Result Value Ref Range   Lactic Acid, Venous 1.6 0.5 - 1.9 mmol/L    Comment: Performed at [facility] [address], [place], [place] [address]  Triglycerides     Status: Abnormal   Collection Time: 09/22/17  3:28 AM  Result Value Ref Range   Triglycerides 276 (H) <150 mg/dL    Comment: Performed at [facility] [address] [place], [place] [address]  Procalcitonin - Baseline     Status: None   Collection Time: 09/22/17  3:28 AM  Result Value Ref Range   Procalcitonin 0.16 ng/mL    Comment:        Interpretation: PCT (Procalcitonin) <= 0.5 ng/mL: Systemic infection (sepsis) is not likely. Local bacterial infection is possible. (NOTE)       Sepsis PCT Algorithm           Lower Respiratory Tract                                      Infection PCT Algorithm    ----------------------------     ----------------------------         PCT < 0.25 ng/mL                PCT < 0.10 ng/mL         Strongly encourage             Strongly discourage   discontinuation of antibiotics    initiation of antibiotics    ----------------------------     -----------------------------       PCT 0.25 - 0.50 ng/mL            PCT 0.10 - 0.25 ng/mL               OR       >  80% decrease in PCT            Discourage initiation of                                            antibiotics       Encourage discontinuation           of antibiotics    ----------------------------     -----------------------------         PCT >= 0.50 ng/mL              PCT 0.26 - 0.50 ng/mL               AND        <80% decrease in PCT             Encourage initiation of                                             antibiotics       Encourage continuation           of antibiotics    ----------------------------     -----------------------------        PCT >= 0.50 ng/mL                  PCT > 0.50 ng/mL               AND         increase in PCT                  Strongly encourage                                      initiation of antibiotics    Strongly encourage escalation           of antibiotics                                     -----------------------------                                           PCT <= 0.25 ng/mL                                                 OR                                        > 80% decrease in PCT                                     Discontinue / Do not initiate  antibiotics Performed at [facility], [place] [address] [place], [place] [address]   Basic metabolic panel     Status: Abnormal   Collection Time: 09/22/17  3:28 AM  Result Value Ref Range   Sodium 143 135 - 145 mmol/L   Potassium 3.7 3.5 - 5.1 mmol/L   Chloride 111 101 - 111 mmol/L   CO2 23 22 - 32 mmol/L   Glucose, Bld 165 (H) 65 - 99 mg/dL   BUN 22 (H) 6 - 20 mg/dL   Creatinine, Ser 0.90 0.44 - 1.00 mg/dL   Calcium 7.5 (L) 8.9 - 10.3 mg/dL   GFR calc non Af Amer >60 >60 mL/min   GFR calc Af Amer >60 >60 mL/min    Comment: (NOTE) The eGFR has been calculated using the CKD EPI equation. This calculation has not been validated in all clinical situations. eGFR's persistently <60 mL/min signify possible Chronic Kidney Disease.    Anion gap 9 5 - 15    Comment: Performed at [facility] [address], [place], [place] [address]   Glucose, capillary     Status: Abnormal   Collection Time: 09/22/17  4:12 AM  Result Value Ref Range   Glucose-Capillary 164 (H) 65 - 99 mg/dL   Comment 1 Notify RN   I-STAT 3, arterial blood gas (G3+)     Status: Abnormal   Collection Time: 09/22/17  4:31 AM  Result Value Ref Range   pH, Arterial 7.320 (L) 7.350 - 7.450   pCO2 arterial 46.8 32.0 - 48.0 mmHg   pO2, Arterial 99.0 83.0 - 108.0 mmHg   Bicarbonate 24.1 20.0 - 28.0 mmol/L   TCO2 26 22 - 32 mmol/L   O2 Saturation 97.0 %   Acid-base deficit 2.0 0.0 - 2.0 mmol/L   Patient temperature HIDE    Collection site RADIAL, ALLEN'S TEST ACCEPTABLE    Drawn by RT    Sample type ARTERIAL   Troponin I (q 6hr x 3)     Status: Abnormal   Collection Time: 09/22/17  6:48 AM  Result Value Ref Range   Troponin I 0.04 (HH) <0.03 ng/mL    Comment: CRITICAL RESULT CALLED TO, READ BACK BY AND VERIFIED WITH: Rob Bunting RN [id number] 09/22/2017 BY A BENNETT Performed at [facility], [address], [place], [place] [address]   Glucose, capillary     Status: Abnormal   Collection Time: 09/22/17  7:30 AM  Result Value Ref Range   Glucose-Capillary 162 (H) 65 - 99 mg/dL   Comment 1 Capillary Specimen    Comment 2 Notify RN   CBC     Status: Abnormal   Collection Time: 09/22/17  9:37 AM  Result Value Ref Range   WBC 14.2 (H) 4.0 - 10.5 K/uL   RBC 4.28 3.87 - 5.11 MIL/uL   Hemoglobin 11.7 (L) 12.0 - 15.0 g/dL   HCT 36.4 36.0 - 46.0 %   MCV 85.0 78.0 - 100.0 fL   MCH 27.3 26.0 - 34.0 pg   MCHC 32.1 30.0 - 36.0 g/dL   RDW 17.8 (H) 11.5 - 15.5 %   Platelets 196 150 - 400 K/uL    Comment: Performed at [facility], [place] [address] [place], [place] [address]  Glucose, capillary     Status: Abnormal   Collection Time: 09/22/17 11:24 AM  Result Value Ref Range   Glucose-Capillary 179 (H) 65 - 99 mg/dL   Comment 1 Capillary Specimen  Comment 2 Notify RN     Dg Chest Port 1 View  Result Date: 09/21/2017 CLINICAL DATA:  Endotracheal  and gastric tube placement. Hematemesis. EXAM: PORTABLE CHEST 1 VIEW COMPARISON:  04/09/2017 FINDINGS: AP portable semi upright view. Endotracheal tube tip is 2 cm above the carina. Gastric tube tip and side-port are coiled within the proximal stomach. There is cardiomegaly with minimal atelectasis at the lung bases. No alveolar consolidation or CHF. No effusion or pneumothorax. No acute osseous abnormality. IMPRESSION: 1. Cardiomegaly with bibasilar atelectasis. No active pulmonary disease. 2. Tip of endotracheal tube approximately 2 cm above the carina. Gastric tube extends into the stomach. Electronically Signed   By: Ashley Royalty M.D.   On: 09/21/2017 23:06    ROSnegative except above Blood pressure 122/67, pulse 71, temperature 98.3 F (36.8 C), temperature source Oral, resp. rate 18, height '5\' 4"'$  (1.626 m), weight 120.2 kg (264 lb 15.9 oz), SpO2 98 %. Physical Examvital signs stable afebrile no acute distress currently exam please see preassessment evaluation labs reviewed  Assessment/Plan: Upper GI bleeding in a patient with history of AVMs Plan: We will proceed with urgent endoscopy with further workup and plans pending those findings And as an addendum after the procedure the case was discussed with her husband who says she began having black bowel movements on Wednesday but was on iron but her primary care team may have changed that and she was not on any aspirin or nonsteroidals at home but does take Tylenol PM  Emaleigh Guimond E 09/22/2017, 11:35 AM

## 2017-09-22 NOTE — Progress Notes (Signed)
[facility] Progress Note Patient Name: Peggy House DOB: 1960/05/07 MRN: [id number]   Date of Service  09/22/2017  HPI/Events of Note  Severe agitation - Request to increase Propofol IV infusion and soft bilateral wrist restraints.   eICU Interventions  Will order: 1. Increase ceiling on Propofol IV infusion to 80 mcg/kg/min. 2. Bilateral soft wrist restraints.      Intervention Category Major Interventions: Delirium, psychosis, severe agitation - evaluation and management  Sommer,Steven Eugene 09/22/2017, 3:00 AM

## 2017-09-22 NOTE — Progress Notes (Signed)
Patient assessed at bedside for extubation.  Alert, follows two-step commands, calm.  Normal strength.  Normal rate / effort on PSV 5/5.  Vt 400-850.    Plan: Proceed with extubation [place] for clears after 4 hours, no red food coloring / dye Aspiration precautions   Noe Gens, NP-C [facility] Pgr: [phone] or if no answer [phone] 09/22/2017, 1:45 PM

## 2017-09-22 NOTE — H&P (Addendum)
PULMONARY / CRITICAL CARE MEDICINE   Name: Peggy House MRN: [id number] DOB: 01/11/1961    ADMISSION DATE:  09/21/2017 CONSULTATION DATE:  09/22/2017  REFERRING MD:  Dr. 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 Husband states that patient has been weak since Tuesday, however this afternoon she started vomiting dark blood. On arrival to ED patient experienced episodes of bradycardia thought to be secondary to vagal episode. Hemoglobin 3.9. Intubated. Given 8 units RBC and 2 units Plasma. Hemoglobin now 11.1. GI consulted. PCCM asked to admit   Husband states that patient has been taking increased amounts of tylenol over the past week.   PAST MEDICAL HISTORY :  She  has a past medical history of Anxiety, Arthritis, GERD (gastroesophageal reflux disease), History of swelling of feet, Hyperlipidemia, Hypertension, Major depressive disorder, recurrent episode ([place]) (06/05/2015), Obesity, and Snores.  PAST SURGICAL HISTORY: She  has a past surgical history that includes Abdominal hysterectomy; L shoulder Surgery (2011); Carpal tunnel release (05/13/2011); Dilation and curettage of uterus; DG TOES*L* (2/10); Esophagogastroduodenoscopy (N/A, 04/10/2014); Esophagogastroduodenoscopy (egd) with propofol (N/A, 04/27/2014); Hot hemostasis (N/A, 04/27/2014); Colonoscopy with propofol (N/A, 04/28/2014); and Esophagogastroduodenoscopy (N/A, 05/10/2017).  Allergies  Allergen Reactions  . Nsaids Other (See Comments)    Stomach bleeding episodes; Tylenol is OK  . Tomato Hives    No current facility-administered medications on file prior to encounter.    Current Outpatient Medications on File Prior to Encounter  Medication Sig  . acetaminophen (TYLENOL) 325 MG tablet Take 650 mg by mouth  every 6 (six) hours as needed.  [name] albuterol (PROVENTIL HFA;VENTOLIN HFA) 108 (90 Base) MCG/ACT inhaler Inhale 1-2 puffs into the lungs every 6 (six) hours as needed for wheezing or shortness of breath.  [name] amLODipine (NORVASC) 5 MG tablet Take 1 tablet (5 mg total) by mouth daily.  [name] atorvastatin (LIPITOR) 80 MG tablet take 1 tablet by mouth once daily  . FEROSUL 325 (65 Fe) MG tablet Take 325 mg by mouth daily.  . ferrous gluconate (FERGON) 324 MG tablet Take 1 tablet (324 mg total) by mouth 3 (three) times daily with meals.  . furosemide (LASIX) 40 MG tablet Take 1 tablet (40 mg total) by mouth daily.  [name] lisinopril (PRINIVIL,ZESTRIL) 10 MG tablet Take 2 tablets (20 mg total) by mouth daily.  . metoprolol succinate (TOPROL-XL) 100 MG 24 hr tablet Take 1 tablet (100 mg total) by mouth daily. Take with or immediately following a meal.  . pantoprazole (PROTONIX) 40 MG tablet take 1 tablet by mouth once daily  . sertraline (ZOLOFT) 50 MG tablet Take 1 tablet (50 mg total) by mouth daily.    FAMILY HISTORY:  Her indicated that her mother is deceased. She indicated that her father is deceased. She indicated that both of her sisters are alive. She indicated that her maternal grandmother is deceased. She indicated that her maternal grandfather is deceased. She indicated that the status of her son is unknown. She indicated that her maternal aunt is alive. She indicated that the status of her neg hx is unknown.   SOCIAL HISTORY: She  reports that she has been smoking cigarettes.  She has a 15.00 pack-year smoking history. She quit smokeless tobacco use about 38 years ago. Her smokeless tobacco use  included snuff. She reports that she does not drink alcohol or use drugs.  REVIEW OF SYSTEMS:   Unable to review as patient is intubated and sedated   SUBJECTIVE:   VITAL SIGNS: BP (!) 101/59   Pulse 62   Temp 98 F (36.7 C) (Oral)   Resp (!) 29   Ht 5\' 4"  (1.626 m)   Wt 124.7 kg (275 lb)   SpO2 100%    BMI 47.20 kg/m   HEMODYNAMICS:    VENTILATOR SETTINGS: Vent Mode: PRVC FiO2 (%):  [40 %-60 %] 40 % Set Rate:  [14 bmp-18 bmp] 18 bmp Vt Set:  [440 mL] 440 mL PEEP:  [5 cmH20] 5 cmH20 Plateau Pressure:  [17 cmH20-24 cmH20] 24 cmH20  INTAKE / OUTPUT: No intake/output data recorded.  PHYSICAL EXAMINATION: General:  Adult female on vent  Neuro:  Sedated, opens eyes to physical stimulation, pupils intact  HEENT:  ETT in place  Cardiovascular:  RRR, no MRG  Lungs:  Clear breath sounds, no wheeze/crackles  Abdomen:  Obese, active bowel sounds  Musculoskeletal:  -edema  Skin:  Warm, dry   LABS:  BMET Recent Labs  Lab 09/21/17 2148 09/21/17 2242  NA 140 144  K 3.5 3.9  CL 106 105  CO2 25  --   BUN 25* 27*  CREATININE 0.92 0.80  GLUCOSE 123* 117*    Electrolytes Recent Labs  Lab 09/21/17 2148  CALCIUM 8.0*    CBC Recent Labs  Lab 09/21/17 2148 09/21/17 2242 09/21/17 2322 09/21/17 2324  WBC 18.5*  --   --  17.0*  HGB 3.9* 5.4*  --  11.1*  HCT 14.4* 16.0*  --  36.1  PLT 333  --  190 210    Coag's Recent Labs  Lab 09/21/17 2322  APTT 25  INR 1.45    Sepsis Markers No results for input(s): LATICACIDVEN, PROCALCITON, O2SATVEN in the last 168 hours.  ABG Recent Labs  Lab 09/22/17 0040  PHART 7.285*  PCO2ART 52.0*  PO2ART 175.0*    Liver Enzymes Recent Labs  Lab 09/21/17 2148  AST 13*  ALT 8*  ALKPHOS 67  BILITOT 0.4  ALBUMIN 2.6*    Cardiac Enzymes No results for input(s): TROPONINI, PROBNP in the last 168 hours.  Glucose No results for input(s): GLUCAP in the last 168 hours.  Imaging Dg Chest Port 1 View  Result Date: 09/21/2017 CLINICAL DATA:  Endotracheal and gastric tube placement. Hematemesis. EXAM: PORTABLE CHEST 1 VIEW COMPARISON:  04/09/2017 FINDINGS: AP portable semi upright view. Endotracheal tube tip is 2 cm above the carina. Gastric tube tip and side-port are coiled within the proximal stomach. There is cardiomegaly  with minimal atelectasis at the lung bases. No alveolar consolidation or CHF. No effusion or pneumothorax. No acute osseous abnormality. IMPRESSION: 1. Cardiomegaly with bibasilar atelectasis. No active pulmonary disease. 2. Tip of endotracheal tube approximately 2 cm above the carina. Gastric tube extends into the stomach. Electronically Signed   By: Ashley Royalty M.D.   On: 09/21/2017 23:06     STUDIES:  CXR 6/09 > 1. Cardiomegaly with bibasilar atelectasis. No active pulmonary disease. 2. Tip of endotracheal tube approximately 2 cm above the carina. Gastric tube extends into the stomach.  CULTURES: None.   ANTIBIOTICS: None.   SIGNIFICANT EVENTS: 6/09 > Presents to ED   LINES/TUBES: ETT 6/09 >>  DISCUSSION: 57 year old female with known AV Malformation presents to ED with hemoglobin 3.9 with hematemesis. Intubated for airway protection. Given  8 units RBC and 2 units Plasma.    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  H/O GERD P:   GI Consulted > Plans for EGD in AM  Continue Protonix gtt NPO  Acute Blood Loss Anemia s/p 8 units RBC and 2 units Plasma  Chronic Iron Deficiency Anemia  P:  Trend CBC q6h  Hold all anticoagulation  Tylenol Level Pending   Leukocytosis > Likely reactive  P:   Trend WBC and Fever Curve  Trend LA and PCT     Sedation Needs H/O Major Depressive Disorder  P:   RASS goal: 0/-1 Wean Propofol to achieve RASS Hold home Zoloft   FAMILY  - Updates: Family updated at bedside   -  Inter-disciplinary family meet or Palliative Care meeting due by: 09/29/2017     Hayden Pedro, AGACNP-BC [facility]  Pgr: [phone]  PCCM Pgr: [phone]

## 2017-09-22 NOTE — Sedation Documentation (Signed)
Pt's clothing and wedding ring given to husband.

## 2017-09-22 NOTE — Progress Notes (Signed)
Chaplain present in the ED when asked by staff to provide spiritual care support for this family who is present with the patient. Presented to the family, introduction made as Chaplain, they were escorted to the Consult Room, comfort measures were offered. Chaplain also offered word of prayer and encouragement, and informed them I wll continue to follow up with with updates on their family member. They were appreciative of the the support. Chaplain Yaakov Guthrie [phone]

## 2017-09-23 ENCOUNTER — Encounter (HOSPITAL_COMMUNITY): Payer: Self-pay | Admitting: Gastroenterology

## 2017-09-23 ENCOUNTER — Inpatient Hospital Stay (HOSPITAL_COMMUNITY): Payer: Medicaid Other

## 2017-09-23 DIAGNOSIS — F329 Major depressive disorder, single episode, unspecified: Secondary | ICD-10-CM

## 2017-09-23 DIAGNOSIS — E876 Hypokalemia: Secondary | ICD-10-CM

## 2017-09-23 DIAGNOSIS — Z79899 Other long term (current) drug therapy: Secondary | ICD-10-CM

## 2017-09-23 DIAGNOSIS — D500 Iron deficiency anemia secondary to blood loss (chronic): Secondary | ICD-10-CM

## 2017-09-23 DIAGNOSIS — Q2739 Arteriovenous malformation, other site: Secondary | ICD-10-CM

## 2017-09-23 DIAGNOSIS — I10 Essential (primary) hypertension: Secondary | ICD-10-CM

## 2017-09-23 LAB — BASIC METABOLIC PANEL
ANION GAP: 6 (ref 5–15)
BUN: 9 mg/dL (ref 6–20)
CHLORIDE: 112 mmol/L — AB (ref 101–111)
CO2: 26 mmol/L (ref 22–32)
Calcium: 7.6 mg/dL — ABNORMAL LOW (ref 8.9–10.3)
Creatinine, Ser: 0.84 mg/dL (ref 0.44–1.00)
GFR calc Af Amer: >60 mL/min (ref >60)
GFR calc non Af Amer: >60 mL/min (ref >60)
GLUCOSE: 88 mg/dL (ref 65–99)
Potassium: 2.9 mmol/L — ABNORMAL LOW (ref 3.5–5.1)
Sodium: 144 mmol/L (ref 135–145)

## 2017-09-23 LAB — GLUCOSE, CAPILLARY
GLUCOSE-CAPILLARY: 109 mg/dL — AB (ref 65–99)
GLUCOSE-CAPILLARY: 117 mg/dL — AB (ref 65–99)
GLUCOSE-CAPILLARY: 103 mg/dL — AB (ref 65–99)
Glucose-Capillary: 91 mg/dL (ref 65–99)
Glucose-Capillary: 100 mg/dL — ABNORMAL HIGH (ref 65–99)

## 2017-09-23 LAB — CBC
HCT: 33.5 % — ABNORMAL LOW (ref 36.0–46.0)
HCT: 32.5 % — ABNORMAL LOW (ref 36.0–46.0)
Hemoglobin: 10.7 g/dL — ABNORMAL LOW (ref 12.0–15.0)
Hemoglobin: 10.4 g/dL — ABNORMAL LOW (ref 12.0–15.0)
MCH: 27.4 pg (ref 26.0–34.0)
MCH: 27.2 pg (ref 26.0–34.0)
MCHC: 31.9 g/dL (ref 30.0–36.0)
MCHC: 32 g/dL (ref 30.0–36.0)
MCV: 85.7 fL (ref 78.0–100.0)
MCV: 84.9 fL (ref 78.0–100.0)
PLATELETS: 174 10*3/uL (ref 150–400)
PLATELETS: 183 10*3/uL (ref 150–400)
RBC: 3.91 MIL/uL (ref 3.87–5.11)
RBC: 3.83 MIL/uL — AB (ref 3.87–5.11)
RDW: 18.3 % — ABNORMAL HIGH (ref 11.5–15.5)
RDW: 18.4 % — ABNORMAL HIGH (ref 11.5–15.5)
WBC: 13.7 10*3/uL — ABNORMAL HIGH (ref 4.0–10.5)
WBC: 14.2 10*3/uL — AB (ref 4.0–10.5)

## 2017-09-23 LAB — PROCALCITONIN: Procalcitonin: 0.21 ng/mL

## 2017-09-23 MED ORDER — ALBUTEROL SULFATE (2.5 MG/3ML) 0.083% IN NEBU: 2.5000 mg | INHALATION_SOLUTION | Freq: Four times a day (QID) | RESPIRATORY_TRACT | Status: DC | PRN

## 2017-09-23 MED ORDER — SERTRALINE HCL 50 MG PO TABS
50.0000 mg | ORAL_TABLET | Freq: Every day | ORAL | Status: DC
  Administered 2017-09-24 – 2017-09-25 (×2): 50 mg via ORAL
  Filled 2017-09-23 (×2): qty 1

## 2017-09-23 MED ORDER — FUROSEMIDE 10 MG/ML IJ SOLN
40.0000 mg | Freq: Once | INTRAMUSCULAR | Status: AC
  Administered 2017-09-23: 40 mg via INTRAVENOUS
  Filled 2017-09-23: qty 4

## 2017-09-23 MED ORDER — ATORVASTATIN CALCIUM 80 MG PO TABS
80.0000 mg | ORAL_TABLET | Freq: Every day | ORAL | Status: DC
  Administered 2017-09-23 – 2017-09-24 (×2): 80 mg via ORAL
  Filled 2017-09-23 (×3): qty 1

## 2017-09-23 MED ORDER — POTASSIUM CHLORIDE 10 MEQ/100ML IV SOLN
10.0000 meq | INTRAVENOUS | Status: AC
Start: 2017-09-23 — End: 2017-09-23
  Administered 2017-09-23 (×4): 10 meq via INTRAVENOUS
  Filled 2017-09-23 (×4): qty 100

## 2017-09-23 NOTE — Progress Notes (Addendum)
Patient summary: [name] is a 57 year old woman with a history of chronic anemia attributed to hereditary hemorrhagic telangiectasias characterized by recurrent epistaxis and gastric AVM bleeding who presented on 6/9 after what sounds like at [facility] 2 days but up to 1 week of episodic hematemesis and watery black stools and worsening weakness.  She was admitted to the ICU with a hemoglobin of 3.9 where she underwent intubation, large volume blood transfusion of 8 units PRBCs and 2 units FFP, and emergent endoscopy.  This identified a small duodenal bleeding ulcer which was treated and subsequently she has remained hemodynamically stable.  She was extubated yesterday 4/69 without complication although remains on a low rate of supplemental oxygen with associated radiographic findings of mild pulmonary edema.  Subjective: [name] is feeling much better this morning and reports some upper abdominal pain but no more nausea or vomiting.  She tolerated some clear diet last night without incident.  She is being transferred to the internal medicine teaching service and out of the intensive care unit as she now appears hemodynamically stable.  Objective:  Vital signs in last 24 hours: Vitals:   09/23/17 0700 09/23/17 0730 09/23/17 0800 09/23/17 0900  BP: (!) 156/87  (!) 164/81 (!) 123/92  Pulse: 67  66 62  Resp: (!) _0 Temp:  98.6 F (37 C)    TempSrc:  Axillary    SpO2: 92%  98% 97%  Weight:      Height:       GENERAL- alert, co-operative, NAD HEENT- Atraumatic, oral mucosa appears moist, no cervical LN enlargement. CARDIAC- RRR, early to mid systolic murmur at the left upper sternal border RESP-normal work of breathing, on 2 L/min O2 by nasal cannula, some upper airway wheezes, bibasilar inspiratory crackles ABDOMEN-epigastric tenderness to palpation, bowel sounds normal throughout, no rebound tenderness EXTREMITIES- Symmetric, no pedal edema. SKIN-warm, dry, hyperpigmented skin  changes over both knees PSYCH- Normal mood and affect, appropriate thought content and speech.  Assessment/Plan: Upper GI bleed Her bleeding appears to have stopped or at least reduced to a minimal rate. Given the profound anemia on arrival we can plan to clinically observe for continued/recurrent bleeding while slowly advancing back towards a solid food diet. -Repeat Hgb q12hrs today -Diet at clears this morning -Continue PPI IV -Continue SCDs only VTE ppx -Transfer from ICU  Hereditary hemorrhagic telangiectasia Gastric AVMs Chronic anemia She would benefit from closer follow-up or more aggressive iron replacement than she has been agreeable to in the past.  Hemoglobin was in the 7-8 range 4 months prior to this event.  She was on oral FeSO4 325 mg outpatient and also previously met with Dr. Annamaria Boots at the cancer center to arrange for Feraheme infusions but appears to have missed a lot of the follow-up for this. -Follow-up at least every few weeks will need to be coordinated at her discharge planning. -Avoid NSAIDs long term  Acute respiratory failure with hypoxia This is improving and now only on 2 L/min supplemental oxygen.  At this time findings are most consistent with transfusion associated circulatory overload. That could also account for the apparently new systolic cardiac murmur. She is on a home dose of 40 mg p.o. Lasix for her chronic peripheral edema. -Lasix 33m IV, F/U urine output for repeat dosing  Hypokalemia Potassium at 2.9 today and starting higher than home dose diuretic doses. -Replace KCl 40 mEq -Bmet in AM  Hypertension Home regimen of amlodipine 5 mg, metoprolol 100 mg  twice daily, lisinopril 20 mg was held due to active upper GI bleed on admission.  Blood pressure readings this morning are variable, so I will delay restarting her medications until consistently hypertensive or become severe.  Depression This problem is not in exacerbation. She can resume her  home sertraline 44m daily  Diet: Clear DVT ppx: SCDs FULL CODE  Dispo: Anticipated discharge in approximately 1-2 day(s).   CCollier Salina MD PGY-III Internal Medicine Resident Pager# [phone]/02/2018, 9:33 AM

## 2017-09-23 NOTE — Progress Notes (Signed)
Attempted to call report to 5W 

## 2017-09-23 NOTE — Progress Notes (Signed)
Peggy House 9:45 AM  Subjective: Patient doing well without signs of further bleeding and tolerating clear liquids and minimal abdominal pain from her procedure probably from the epi injection which we discussed an additional prehospitalization history was obtained and case discussed with her nurse as well  Objective: Vital signs stable afebrile extubated answering questions appropriately abdomen is soft rare bowel sounds minimal midepigastric discomfort BUN okay hemoglobin okay  Assessment: Status post therapy for small duodenal bulb ulcer  Plan: If doing well tomorrow may slowly advance diet and can change to oral pump inhibitors then  [facility] E  Pager [phone] After 5PM or if no answer call [phone]

## 2017-09-23 NOTE — Progress Notes (Signed)
Report called to [facility] [phone]

## 2017-09-23 NOTE — Progress Notes (Signed)
Patient transferred to [id number] via bed tolerated welll

## 2017-09-24 ENCOUNTER — Telehealth: Payer: Self-pay

## 2017-09-24 LAB — HEMOGLOBIN AND HEMATOCRIT, BLOOD
HCT: 32 % — ABNORMAL LOW (ref 36.0–46.0)
Hemoglobin: 10.1 g/dL — ABNORMAL LOW (ref 12.0–15.0)

## 2017-09-24 LAB — BASIC METABOLIC PANEL
Anion gap: 9 (ref 5–15)
BUN: 8 mg/dL (ref 6–20)
CALCIUM: 8.3 mg/dL — AB (ref 8.9–10.3)
CO2: 25 mmol/L (ref 22–32)
Chloride: 108 mmol/L (ref 101–111)
Creatinine, Ser: 0.77 mg/dL (ref 0.44–1.00)
GFR calc Af Amer: >60 mL/min (ref >60)
Glucose, Bld: 100 mg/dL — ABNORMAL HIGH (ref 65–99)
Potassium: 3.2 mmol/L — ABNORMAL LOW (ref 3.5–5.1)
Sodium: 142 mmol/L (ref 135–145)

## 2017-09-24 LAB — GLUCOSE, CAPILLARY
GLUCOSE-CAPILLARY: 100 mg/dL — AB (ref 65–99)
GLUCOSE-CAPILLARY: 96 mg/dL (ref 65–99)
Glucose-Capillary: 102 mg/dL — ABNORMAL HIGH (ref 65–99)
Glucose-Capillary: 105 mg/dL — ABNORMAL HIGH (ref 65–99)
Glucose-Capillary: 111 mg/dL — ABNORMAL HIGH (ref 65–99)
Glucose-Capillary: 102 mg/dL — ABNORMAL HIGH (ref 65–99)

## 2017-09-24 LAB — CBC
HCT: 32.7 % — ABNORMAL LOW (ref 36.0–46.0)
Hemoglobin: 10.2 g/dL — ABNORMAL LOW (ref 12.0–15.0)
MCH: 27.5 pg (ref 26.0–34.0)
MCHC: 31.2 g/dL (ref 30.0–36.0)
MCV: 88.1 fL (ref 78.0–100.0)
PLATELETS: 181 10*3/uL (ref 150–400)
RBC: 3.71 MIL/uL — ABNORMAL LOW (ref 3.87–5.11)
RDW: 18.7 % — AB (ref 11.5–15.5)
WBC: 14.5 10*3/uL — AB (ref 4.0–10.5)

## 2017-09-24 MED ORDER — AMLODIPINE BESYLATE 5 MG PO TABS
5.0000 mg | ORAL_TABLET | Freq: Every day | ORAL | Status: DC
  Administered 2017-09-24 – 2017-09-25 (×2): 5 mg via ORAL
  Filled 2017-09-24 (×2): qty 1

## 2017-09-24 MED ORDER — ACETAMINOPHEN 325 MG PO TABS
650.0000 mg | ORAL_TABLET | Freq: Four times a day (QID) | ORAL | Status: DC | PRN
  Administered 2017-09-24: 650 mg via ORAL
  Filled 2017-09-24: qty 2

## 2017-09-24 MED ORDER — FERUMOXYTOL INJECTION 510 MG/17 ML
510.0000 mg | Freq: Once | INTRAVENOUS | Status: AC
  Administered 2017-09-24: 510 mg via INTRAVENOUS
  Filled 2017-09-24: qty 17

## 2017-09-24 MED ORDER — POTASSIUM CHLORIDE 20 MEQ/15ML (10%) PO SOLN
40.0000 meq | Freq: Once | ORAL | Status: AC
  Administered 2017-09-24: 40 meq via ORAL
  Filled 2017-09-24: qty 30

## 2017-09-24 MED ORDER — FUROSEMIDE 40 MG PO TABS
40.0000 mg | ORAL_TABLET | Freq: Every day | ORAL | Status: DC
Start: 2017-09-24 — End: 2017-09-25
  Administered 2017-09-24 – 2017-09-25 (×2): 40 mg via ORAL
  Filled 2017-09-24 (×2): qty 1

## 2017-09-24 MED ORDER — PANTOPRAZOLE SODIUM 40 MG PO TBEC
40.0000 mg | DELAYED_RELEASE_TABLET | Freq: Two times a day (BID) | ORAL | Status: DC
  Administered 2017-09-24 – 2017-09-25 (×3): 40 mg via ORAL
  Filled 2017-09-24 (×3): qty 1

## 2017-09-24 MED ORDER — METOPROLOL SUCCINATE ER 100 MG PO TB24
100.0000 mg | ORAL_TABLET | Freq: Every day | ORAL | Status: DC
  Administered 2017-09-24 – 2017-09-25 (×2): 100 mg via ORAL
  Filled 2017-09-24 (×2): qty 1

## 2017-09-24 NOTE — Progress Notes (Signed)
PT Cancellation Note  Patient Details Name: Peggy House MRN: [id number] DOB: 1960-09-14   Cancelled Treatment:    Reason Eval/Treat Not Completed: Medical issues which prohibited therapy Per RN, pt with new GI bleed this afternoon and requesting to hold PT. Will follow up as pt medically appropriate and as schedule allows.   Leighton Ruff, PT, DPT  Acute Rehabilitation Services  Pager: [phone]  Rudean Hitt 09/24/2017, 4:13 PM

## 2017-09-24 NOTE — Progress Notes (Signed)
Subjective: The patient was resting in her bed today upon entering the room. She denied acute complaints. She had not been to the bathroom or chair as she has not been out of bed since being brought to the floor. She was encouraged to ambulate and if amenable we would discharge her later today to which she was agreeable.   Objective:  Vital signs in last 24 hours: Vitals:   09/23/17 2212 09/24/17 0003 09/24/17 0405 09/24/17 0802  BP: (!) 168/80 (!) 150/71 (!) 171/74 (!) 164/77  Pulse: 74 72 71 69  Resp: (!) 21 (!) 23 (!) 21 18  Temp: 98.4 F (36.9 C) 98.5 F (36.9 C)  98.2 F (36.8 C)  TempSrc: Oral Oral  Oral  SpO2: 96% 96% 95% 97%  Weight:      Height:       Physical Exam  Constitutional: She is oriented to person, place, and time. She appears well-developed and well-nourished. No distress.  HENT:  Head: Normocephalic and atraumatic.  Eyes: Conjunctivae and EOM are normal.  Neck: Normal range of motion. Neck supple.  Cardiovascular: Normal rate and regular rhythm.  Murmur heard.  Systolic murmur is present. Pulmonary/Chest: Effort normal and breath sounds normal. No stridor. No respiratory distress.  Abdominal: Soft. Bowel sounds are normal. She exhibits no distension. There is no tenderness.  Musculoskeletal: She exhibits no edema or tenderness.  Neurological: She is alert and oriented to person, place, and time.  Skin: Skin is warm. Capillary refill takes less than 2 seconds. She is not diaphoretic.  Psychiatric: She has a normal mood and affect.  Vitals reviewed.  Assessment/Plan:  Principal Problem:   GI bleed Active Problems:   Gastric AVM   Hereditary hemorrhagic telangiectasia (HCC)   Chronic anemia   Hematemesis   Endotracheally intubated   Acute respiratory failure with hypoxia (HCC)  Assessment: [name] is a 57 yo F with a PMHx notable for chronic anemia attributed to hereditary hemorrhagic telangiectasias characterized by recurrent epistaxis and  gastric AVM bleeding, HLD, HTN, MDD, and morbid obesity who presented with hematemesis.  She has began vomiting black coffee-ground emesis and was experiencing black watery stools x1 week prior to the hematemesis.  Upon presentation her hemoglobin was noted to be 3.9 which in the setting of ongoing hematemesis she underwent large-volume blood transfusion insisting of 8 units of PRBCs and 2 units of FFP prior to emergent upper endoscopy.  She was intubated for airway protection for the endoscopic procedure which revealed a duodenal bleeding ulcer which was treated with clipping.  The patient was extubated, transferred to the floor and noted to be stable on room air with hemoglobin repeated 10.2 on 09/24/2017.  Plan: Upper GI bleed: We are advancing the patient's diet to full solids slowly she tolerates this.  Bleeding appears to have stopped and she is no longer endorsing hematemesis.  Patient stools remain dark but this is expected for ability to 3 days following such a large volume GI bleeding. -Repeat Hgb at her outpatient visit -Diet advanced to soft this morning -Transitioned to oral PPI -Continue SCDs only VTE ppx  Hereditary hemorrhagic telangiectasia: Gastric AVMs: Chronic anemia: We will recommend close monitoring the patient's hemoglobin and iron stores in the future.  She will continue to follow with our clinic. --IV Feraheme 500 mg x 1 dose ordered prior to discharge  Acute respiratory failure with hypoxia Resolved.  Now off of oxygen saturating greater than 98% on room air.  I agree with the  presumptive diagnosis transfusion associated circulatory overload. -Lasix 40mg  PO  Hypokalemia Potassium at 3.2 today and starting higher than home dose diuretic doses.  I suspect this is related to massive PRBC transfusion. -Replace KCl 40 mEq oral solution due to GI upset with large tablets. -Bmet the patient's clinic visit  Hypertension Home regimen of amlodipine 5 mg, metoprolol 100  mg twice daily, lisinopril 20 mg was held due to active upper GI bleed on admission.  Blood pressure readings this morning are variable, so I will delay restarting her medications until consistently hypertensive or become severe. --Resume amlodipine 5 mg daily -Resume metoprolol 100 mg twice daily --We will hold lisinopril until she follows up as an outpatient  Depression This problem is not in exacerbation. She can resume her home sertraline 50mg  daily  Diet: Advancing Code: Full Fluids: IV iron GI PPX: Protonix VTE PPX: SCD's Dispo: Anticipated discharge in approximately 0-1 day(s).   Kathi Ludwig, MD 09/24/2017, 9:40 AM Pager: Pager# [phone]

## 2017-09-24 NOTE — Progress Notes (Signed)
Medicine attending: I examined this patient today together with resident physician Dr. Kathi Ludwig and I concur with his evaluation and management plan. She is doing well.  No signs of active bleeding.  Abdomen is soft and nontender. Hemoglobin 10.2. Impression: Acute upper GI bleed from a duodenal ulcer in a lady with underlying hereditary hemorrhagic telangiectasias.  Status post massive transfusion for initial stabilization.  Ulcer bed clipped with surgical clips.  Currently stable.  Continue Protonix.  Advance diet today.  Anticipate discharge later today or tomorrow.

## 2017-09-24 NOTE — Progress Notes (Signed)
Patient had another stool that was black, no Bright red blood noted, mixed with urine. Patient ambulated hallway with walker and standby assist. Vital remained stable.

## 2017-09-24 NOTE — Progress Notes (Signed)
Tried to call the Lab about drawing the STAT H and H and no one answered. I will try again.

## 2017-09-24 NOTE — Discharge Summary (Signed)
Name: Peggy House MRN: [id number] DOB: Apr 07, 1961 57 y.o. PCP: Einar Gip, DO  Date of Admission: 09/21/2017  9:39 PM Date of Discharge: 09/25/2017 Attending Physician: Annia Belt, MD  Discharge Diagnosis: 1. Hereditary Hemorrhagic Telangiectasia  2. Upper GI bleed with ulceration, hematemesis  3. Hx of Gastric AVMs 4. Chronic anemia  Discharge Medications: Allergies as of 09/25/2017      Reactions   Nsaids Other (See Comments)   Stomach bleeding episodes; Tylenol is OK   Tomato Hives      Medication List    STOP taking these medications   ALPRAZolam 0.5 MG tablet Commonly known as:  XANAX   FLUoxetine 20 MG capsule Commonly known as:  PROZAC   furosemide 40 MG tablet Commonly known as:  LASIX   lisinopril 10 MG tablet Commonly known as:  PRINIVIL,ZESTRIL   traZODone 50 MG tablet Commonly known as:  DESYREL     TAKE these medications   acetaminophen 325 MG tablet Commonly known as:  TYLENOL Take 650 mg by mouth every 6 (six) hours as needed.   albuterol 108 (90 Base) MCG/ACT inhaler Commonly known as:  PROVENTIL HFA;VENTOLIN HFA Inhale 1-2 puffs into the lungs every 6 (six) hours as needed for wheezing or shortness of breath.   amLODipine 5 MG tablet Commonly known as:  NORVASC Take 1 tablet (5 mg total) by mouth daily.   atorvastatin 80 MG tablet Commonly known as:  LIPITOR take 1 tablet by mouth once daily   ferrous gluconate 324 MG tablet Commonly known as:  FERGON Take 1 tablet (324 mg total) by mouth 3 (three) times daily with meals.   ferrous sulfate 325 (65 FE) MG tablet Take 325 mg by mouth daily with breakfast.   metoprolol succinate 100 MG 24 hr tablet Commonly known as:  TOPROL-XL Take 1 tablet (100 mg total) by mouth daily. Take with or immediately following a meal.   pantoprazole 40 MG tablet Commonly known as:  PROTONIX Take 1 tablet (40 mg total) by mouth 2 (two) times daily. What changed:  when to take this     sertraline 50 MG tablet Commonly known as:  ZOLOFT Take 1 tablet (50 mg total) by mouth daily.       Disposition and follow-up:   Peggy House was discharged from [facility] in Stable condition.  At the hospital follow up visit please address:  1.  Chronic anemia secondary to hereditary hemorrhagic to angiectasia: Please repeat CBC to determine stabilization of the patient's hemoglobin.  She will need ongoing iron therapy with IV iron recommended given her GI upset, ulceration and history of AVMs.      GI Bleed/Ulcer: No gastric biopsy was taken during the clipping procedure and concerns for potential H. Pylori infection persist. However, given that testing is performed off PPI's x 2 weeks in the setting of her recent GI ulcer bleed I would be hesitant to have her hold the PPI for testing.     Hypokalemia: see below  2.  Labs / imaging needed at time of follow-up: CBC and BMP (mild hypokalemia)  3.  Pending labs/ test needing follow-up: n/a  Follow-up Appointments:   With her PCP 09/29/2017.   Hospital Course by problem list: Bleeding Ulcer: Ms. Lia House is a 57 yo F with a PMHx notable for chronic anemia attributed to hereditary hemorrhagic telangiectasias characterized by recurrent epistaxis and gastric AVM bleeding, HLD, HTN, MDD, and morbid obesity who presented with hematemesis.  She had begun vomiting black coffee-ground emesis and was experiencing black watery stools x1 week prior to the hematemesis.  Upon presentation her hemoglobin was noted to be 3.9 which in the setting of ongoing hematemesis prompted large-volume blood transfusion insisting of 8 units of PRBCs and 2 units of FFP prior to emergent upper endoscopy.  She was intubated for airway protection for the endoscopic procedure which revealed a duodenal bleeding ulcer which was treated with clipping.  The patient was extubated following hemodynamic stabilization, transferred to the floor and noted to be  stable on room air with hemoglobin repeated 10.2 by 09/24/2017. This however decreased to 8.5 overnight 06/12 but was noted to be stable the following morning at 8.4 without ongoing signs of losses other than dark stools which was to be expected given the volume of blood lost. She was determined to be stable for discharge home following verbal description of strict return precautions.   Note: The patients presentation is not entirely consistent with HHT GI bleeding and could also be due to a secondary process such as H. Pylori infection. Unfortunately, no biopsies were taken and as such we are left with the urea breath test or stool testing. Please assess for ongoing symptoms and if appropriate consider testing for H. Pylori.    Mild Hypokalemia: Most likely secondary to her large volume transfusion which was replaced with IV and oral solution given her Hx of AVM's and gastric ulceration. Please assess appropriateness of repeat testing.  Discharge Vitals:   BP 135/72   Pulse 71   Temp 99.5 F (37.5 C)   Resp 20   Ht 5\' 5"  (1.651 m)   Wt 257 lb 8 oz (116.8 kg)   SpO2 95%   BMI 42.85 kg/m   Pertinent Labs, Studies, and Procedures:  CBC Latest Ref Rng & Units 09/25/2017 09/25/2017 09/24/2017  WBC 4.0 - 10.5 K/uL 20.0(H) - -  Hemoglobin 12.0 - 15.0 g/dL 8.4(L) 8.5(L) 10.1(L)  Hematocrit 36.0 - 46.0 % 26.9(L) 27.6(L) 32.0(L)  Platelets 150 - 400 K/uL 181 - -   CMP Latest Ref Rng & Units 09/24/2017 09/23/2017 09/22/2017  Glucose 65 - 99 mg/dL 100(H) 88 165(H)  BUN 6 - 20 mg/dL 8 9 22(H)  Creatinine 0.44 - 1.00 mg/dL 0.77 0.84 0.90  Sodium 135 - 145 mmol/L 142 144 143  Potassium 3.5 - 5.1 mmol/L 3.2(L) 2.9(L) 3.7  Chloride 101 - 111 mmol/L 108 112(H) 111  CO2 22 - 32 mmol/L 25 26 23   Calcium 8.9 - 10.3 mg/dL 8.3(L) 7.6(L) 7.5(L)  Total Protein 6.5 - 8.1 g/dL - - -  Total Bilirubin 0.3 - 1.2 mg/dL - - -  Alkaline Phos 38 - 126 U/L - - -  AST 15 - 41 U/L - - -  ALT 14 - 54 U/L - - -    Discharge Instructions: Discharge Instructions    Call MD for:  difficulty breathing, headache or visual disturbances   Complete by:  As directed    Call MD for:  persistant dizziness or light-headedness   Complete by:  As directed    Call MD for:  persistant nausea and vomiting   Complete by:  As directed    Diet - low sodium heart healthy   Complete by:  As directed    Increase activity slowly   Complete by:  As directed      Signed: Kathi Ludwig, MD 09/25/2017, 9:10 AM   Pager: Pager# [phone]

## 2017-09-24 NOTE — Progress Notes (Signed)
Peggy House 11:11 AM  Subjective: Patient without any new complaints and no signs of bleeding and agree with resident's note Objective: Vital signs stable afebrile no acute distress hemoglobin okay abdomen is soft nontender  Assessment: Resolve GI bleeding secondary to small duodenal ulcer  Plan: Please let us know if we can help any further during this hospital stay otherwise agree with slow advancement of diet  [facility]  Pager [phone] After 5PM or if no answer call [phone]

## 2017-09-24 NOTE — Progress Notes (Signed)
Patient's GI bleed was dark red/black with some bright red spots. The whole bed pad was covered in blood. MD is aware. STAT H&H was ordered. MD aware of PT consult needed as well.

## 2017-09-24 NOTE — Telephone Encounter (Signed)
Hospital TOC per Dr Berline Lopes, discharge 09/24/2017, appt 09/26/2017.

## 2017-09-25 LAB — TYPE AND SCREEN
ABO/RH(D): A NEG
Antibody Screen: NEGATIVE
UNIT DIVISION: 0
UNIT DIVISION: 0
UNIT DIVISION: 0
UNIT DIVISION: 0
UNIT DIVISION: 0
UNIT DIVISION: 0
UNIT DIVISION: 0
UNIT DIVISION: 0
Unit division: 0
Unit division: 0
Unit division: 0
Unit division: 0
Unit division: 0
Unit division: 0

## 2017-09-25 LAB — BPAM RBC
BLOOD PRODUCT EXPIRATION DATE: 201906182359
BLOOD PRODUCT EXPIRATION DATE: 201906302359
BLOOD PRODUCT EXPIRATION DATE: 201906302359
BLOOD PRODUCT EXPIRATION DATE: 201907022359
BLOOD PRODUCT EXPIRATION DATE: 201907042359
BLOOD PRODUCT EXPIRATION DATE: 201907062359
BLOOD PRODUCT EXPIRATION DATE: 201907062359
BLOOD PRODUCT EXPIRATION DATE: 201907062359
Blood Product Expiration Date: 201906162359
Blood Product Expiration Date: 201906242359
Blood Product Expiration Date: 201907022359
Blood Product Expiration Date: 201907022359
Blood Product Expiration Date: 201907032359
Blood Product Expiration Date: 201907062359
ISSUE DATE / TIME: 201906092208
ISSUE DATE / TIME: 201906092208
ISSUE DATE / TIME: 201906092221
ISSUE DATE / TIME: 201906092221
ISSUE DATE / TIME: 201906092221
ISSUE DATE / TIME: 201906092221
ISSUE DATE / TIME: 201906092246
ISSUE DATE / TIME: 201906092246
ISSUE DATE / TIME: 201906092255
ISSUE DATE / TIME: 201906092255
ISSUE DATE / TIME: 201906092346
ISSUE DATE / TIME: 201906092346
ISSUE DATE / TIME: 201906101630
ISSUE DATE / TIME: 201906102114
UNIT TYPE AND RH: 600
UNIT TYPE AND RH: 600
UNIT TYPE AND RH: 6200
UNIT TYPE AND RH: 6200
UNIT TYPE AND RH: 6200
UNIT TYPE AND RH: 6200
UNIT TYPE AND RH: 6200
Unit Type and Rh: 600
Unit Type and Rh: 600
Unit Type and Rh: 600
Unit Type and Rh: 600
Unit Type and Rh: 6200
Unit Type and Rh: 6200
Unit Type and Rh: 6200

## 2017-09-25 LAB — BASIC METABOLIC PANEL
Anion gap: 11 (ref 5–15)
BUN: 14 mg/dL (ref 6–20)
CALCIUM: 8.2 mg/dL — AB (ref 8.9–10.3)
CO2: 28 mmol/L (ref 22–32)
CREATININE: 0.8 mg/dL (ref 0.44–1.00)
Chloride: 108 mmol/L (ref 101–111)
GFR calc Af Amer: >60 mL/min (ref >60)
GFR calc non Af Amer: >60 mL/min (ref >60)
GLUCOSE: 99 mg/dL (ref 65–99)
Potassium: 3.8 mmol/L (ref 3.5–5.1)
Sodium: 147 mmol/L — ABNORMAL HIGH (ref 135–145)

## 2017-09-25 LAB — CBC
HCT: 26.9 % — ABNORMAL LOW (ref 36.0–46.0)
Hemoglobin: 8.4 g/dL — ABNORMAL LOW (ref 12.0–15.0)
MCH: 27.5 pg (ref 26.0–34.0)
MCHC: 31.2 g/dL (ref 30.0–36.0)
MCV: 87.9 fL (ref 78.0–100.0)
PLATELETS: 181 10*3/uL (ref 150–400)
RBC: 3.06 MIL/uL — ABNORMAL LOW (ref 3.87–5.11)
RDW: 18.9 % — AB (ref 11.5–15.5)
WBC: 20 10*3/uL — AB (ref 4.0–10.5)

## 2017-09-25 LAB — GLUCOSE, CAPILLARY
GLUCOSE-CAPILLARY: 127 mg/dL — AB (ref 65–99)
Glucose-Capillary: 102 mg/dL — ABNORMAL HIGH (ref 65–99)
Glucose-Capillary: 96 mg/dL (ref 65–99)

## 2017-09-25 LAB — HEMOGLOBIN AND HEMATOCRIT, BLOOD
HCT: 27.6 % — ABNORMAL LOW (ref 36.0–46.0)
HEMOGLOBIN: 8.5 g/dL — AB (ref 12.0–15.0)

## 2017-09-25 MED ORDER — PANTOPRAZOLE SODIUM 40 MG PO TBEC: 40.0000 mg | DELAYED_RELEASE_TABLET | Freq: Two times a day (BID) | ORAL | 0 refills | Status: DC

## 2017-09-25 NOTE — Progress Notes (Signed)
   Disposition: Medicine attending discharge note: I personally examined this patient on the day of discharge and I attest to the accuracy of the discharge evaluation and plan as detailed in the final progress note by resident physician Dr. Kathi Ludwig.  57 year old woman with known hereditary hemorrhagic telangiectasias.  She has had previous endoscopic procedures and laser coagulation of gastric AVMs related to this hereditary bleeding disorder.  She presented at the current time with gross hematemesis with findings of profound anemia hemoglobin 3.9, hematocrit 14%.  She required intubation to protect her airway.  She required massive transfusion with 8 units of blood and 2 units of fresh frozen plasma.  Urgent upper endoscopy showed a bleeding duodenal ulcer.  Surgical clips were placed and hemostasis was obtained.  No other areas of AVMs noted at this time. She was extubated when otherwise stable and transferred to the general medical service.  Peak hemoglobin following transfusion 11.7 g which slowly equilibrated down to 8.4 g at time of discharge. Although she was still passing retained melanotic stool, she had no further hematemesis and remained hemodynamically stable and was able to ambulate to the bathroom with no symptomatic orthostatic changes.  Her blood pressures rose to her outpatient baseline in the 283-15 systolic range and her outpatient antihypertensive regimen was resumed except for her lisinopril which is being held.  She  has normal renal function.  Most recent vital signs with blood pressure 118/58, pulse 74 regular, respirations 20, temperature 98.3. She received a 510 mg dose of IV iron.  Subsequent doses can be arranged as outpatient.  Disposition: Condition stable at time of discharge Short interval follow-up in our general medical clinic There were no complications other than those related to her primary problem.

## 2017-09-25 NOTE — Progress Notes (Signed)
Subjective: Patient was lying in her bed today when entering the room.  She states that she has been able to ambulate to the bathroom and to her chair on the prior day without issue.  She continues to deny bright red blood per rectum, but endorses dark stools with a large bowel movement approximately 200 to 300 mL's as per nursing staff and patient.  He was informed that this is expected.  Patient denies dyspnea, lightheadedness, orthostatic symptoms, weakness, or other concerns and is amenable to being discharged home today.  Objective:  Vital signs in last 24 hours: Vitals:   09/24/17 2331 09/25/17 0327 09/25/17 0400 09/25/17 0500  BP: 122/78  135/72   Pulse: 73 68 71   Resp: 19 (!) 21 20   Temp:   99.5 F (37.5 C)   TempSrc:      SpO2: 99% 91% 95%   Weight:    257 lb 8 oz (116.8 kg)  Height:       Physical Exam  Constitutional: She is oriented to person, place, and time. She appears well-developed and well-nourished. No distress.  HENT:  Head: Normocephalic and atraumatic.  Cardiovascular: Normal rate and regular rhythm.  Murmur heard.  Crescendo systolic murmur is present with a grade of 3/6. Pulmonary/Chest: Effort normal and breath sounds normal. No stridor. No respiratory distress.  Abdominal: Soft. Bowel sounds are normal. She exhibits no distension. There is tenderness (Mild mid epigastric greatly improved).  Musculoskeletal: She exhibits no edema or tenderness.  Neurological: She is alert and oriented to person, place, and time.  Skin: Skin is warm. Capillary refill takes less than 2 seconds. She is not diaphoretic.  Psychiatric: She has a normal mood and affect.   Assessment/Plan:  Principal Problem:   GI bleed Active Problems:   Gastric AVM   Hereditary hemorrhagic telangiectasia (HCC)   Chronic anemia   Hematemesis   Endotracheally intubated   Acute respiratory failure with hypoxia (HCC)  Assessment: [name] is a 57 yo F with a PMHx notable for chronic  anemia attributed to hereditary hemorrhagic telangiectasias characterized by recurrent epistaxis and gastric AVM bleeding, HLD, HTN, MDD, and morbid obesity who presented with hematemesis.  She has began vomiting black coffee-ground emesis and was experiencing black watery stools x1 week prior to the hematemesis.  Upon presentation her hemoglobin was noted to be 3.9 which in the setting of ongoing hematemesis she underwent large-volume blood transfusion insisting of 8 units of PRBCs and 2 units of FFP prior to emergent upper endoscopy.  She was intubated for airway protection for the endoscopic procedure which revealed a duodenal bleeding ulcer which was treated with clipping.  The patient was extubated, transferred to the floor and noted to be stable on room air with hemoglobin repeated 10.2 on 09/24/2017. This however decreased to 8.5 overnight but was noted to be stable the following morning at 8.4 without ongoing signs of losses. She was determined to be stable for discharge home following delivery of strict return precautions.   Plan: UpperGI bleed: We are advancing the patient's diet to full solids slowly as she tolerates this.  Bleeding appears to have stopped and she is no longer endorsing hematemesis.  Patient stools remain dark but this is expected for approximately 2-3 days following such a large volume GI bleed. -Repeat Hgb at her outpatient visit is recommended  -Diet advanced to solids and well tolerated  -Transitioned to oral PPI -Continue SCDs only VTE ppx  Hereditary hemorrhagic telangiectasia: Gastric AVMs:  Chronic anemia: We will recommend close monitoring of the patient's hemoglobin and iron stores in the future.  She will continue to follow with our clinic. --IV Feraheme 500 mg x 1 dose ordered prior to discharge  Acute respiratory failure with hypoxia: Resolved.  Now off of oxygen saturating greater than 98% on room air.  I agree with the presumptive diagnosis transfusion  associated circulatory overload. -Discontinuing lasix  Hypokalemia: Potassium at 3.8 today.  I suspect this is related to her massive PRBC transfusion. -Bmet at the patient's next clinic visit  Hypertension: Home regimen of amlodipine 5 mg, metoprolol 100 mg twice daily, lisinopril 20 mg was held due to active upper GI bleed on admission. Blood pressure readings this morning are variable, so I will delay restarting her medications until consistently hypertensive or become severe. --Resume amlodipine 5 mg daily -Resume metoprolol 100 mg twice daily --We will hold lisinopril until she follows up as an outpatient  Depression This problem is not in exacerbation. She can resume her home sertraline 50mg  daily  Diet: Advancing Code: Full Fluids: IV iron GI PPX: Protonix VTE PPX: SCD's Dispo: Anticipated discharge in approximately 0-1 day(s).   Kathi Ludwig, MD 09/25/2017, 6:56 AM Pager: Pager# [phone]

## 2017-09-25 NOTE — Discharge Instructions (Signed)
Thank you for your visit to [facility].  If you have any questions or concerns please do not hesitate to call our clinic anytime 24/7.  Your should expect to have continued dark stools for the next 2 to 3 days but this should clear up by Monday.  If you notice any blood in your stool or there is bright red discharge or begin vomiting again or develop other concerning symptoms please notify Peggy House right away.   I have discharged you on the medications your primary care doctor, Dr. Danford Bad recommended.  If you have any questions or concerns please do not hesitate to contact her.  Please continue to slowly advance your diet after discharge. I would recommend slow progression to more solid foods. Avoid greasy foods, fast foods or steak etc until you have given your body time to heal.

## 2017-09-25 NOTE — Progress Notes (Signed)
[name] to be D/C'd to home per MD order.  Discussed with the patient and all questions fully answered.  VSS, Skin clean, dry and intact without evidence of skin break down, no evidence of skin tears noted. IV catheter discontinued intact. Site without signs and symptoms of complications. Dressing and pressure applied.  An After Visit Summary was printed and given to the patient. Prescription sent to patient pharmacy.  D/c education completed with patient/family including follow up instructions, medication list, d/c activities limitations if indicated, with other d/c instructions as indicated by MD - patient able to verbalize understanding, all questions fully answered.   Patient instructed to return to ED, call 911, or call MD for any changes in condition.   Patient escorted via [place], and D/C home via private auto.  [name] 09/25/2017 11:37 AM

## 2017-09-26 ENCOUNTER — Ambulatory Visit: Payer: Medicaid Other

## 2017-09-26 NOTE — Telephone Encounter (Signed)
Attempted TOC call. Home number not in service. Left message on mobile number requesting return call and appt info reminder. Hubbard Hartshorn, RN, BSN

## 2017-09-29 ENCOUNTER — Ambulatory Visit (INDEPENDENT_AMBULATORY_CARE_PROVIDER_SITE_OTHER): Payer: Medicaid Other | Admitting: Internal Medicine

## 2017-09-29 ENCOUNTER — Encounter (HOSPITAL_COMMUNITY): Payer: Self-pay | Admitting: Student

## 2017-09-29 ENCOUNTER — Other Ambulatory Visit: Payer: Self-pay

## 2017-09-29 ENCOUNTER — Inpatient Hospital Stay (HOSPITAL_COMMUNITY)
Admission: AD | Admit: 2017-09-29 | Discharge: 2017-10-03 | DRG: 300 | Disposition: A | Discharge status: Home / Self Care | Payer: Medicaid Other | Source: Ambulatory Visit | Attending: Student in an Organized Health Care Education/Training Program | Admitting: Student in an Organized Health Care Education/Training Program

## 2017-09-29 VITALS — BP 123/49 | HR 66 | Temp 98.9°F | Wt 252.1 lb

## 2017-09-29 DIAGNOSIS — E785 Hyperlipidemia, unspecified: Secondary | ICD-10-CM | POA: Diagnosis not present

## 2017-09-29 DIAGNOSIS — R040 Epistaxis: Secondary | ICD-10-CM | POA: Diagnosis present

## 2017-09-29 DIAGNOSIS — I10 Essential (primary) hypertension: Secondary | ICD-10-CM | POA: Diagnosis not present

## 2017-09-29 DIAGNOSIS — D62 Acute posthemorrhagic anemia: Secondary | ICD-10-CM | POA: Diagnosis present

## 2017-09-29 DIAGNOSIS — R011 Cardiac murmur, unspecified: Secondary | ICD-10-CM

## 2017-09-29 DIAGNOSIS — Z91018 Allergy to other foods: Secondary | ICD-10-CM

## 2017-09-29 DIAGNOSIS — Q2733 Arteriovenous malformation of digestive system vessel: Secondary | ICD-10-CM | POA: Diagnosis present

## 2017-09-29 DIAGNOSIS — I780 Hereditary hemorrhagic telangiectasia: Principal | ICD-10-CM

## 2017-09-29 DIAGNOSIS — Z90710 Acquired absence of both cervix and uterus: Secondary | ICD-10-CM | POA: Diagnosis not present

## 2017-09-29 DIAGNOSIS — H547 Unspecified visual loss: Secondary | ICD-10-CM | POA: Diagnosis present

## 2017-09-29 DIAGNOSIS — Z832 Family history of diseases of the blood and blood-forming organs and certain disorders involving the immune mechanism: Secondary | ICD-10-CM

## 2017-09-29 DIAGNOSIS — K219 Gastro-esophageal reflux disease without esophagitis: Secondary | ICD-10-CM | POA: Diagnosis present

## 2017-09-29 DIAGNOSIS — E870 Hyperosmolality and hypernatremia: Secondary | ICD-10-CM | POA: Diagnosis not present

## 2017-09-29 DIAGNOSIS — K922 Gastrointestinal hemorrhage, unspecified: Secondary | ICD-10-CM

## 2017-09-29 DIAGNOSIS — F17210 Nicotine dependence, cigarettes, uncomplicated: Secondary | ICD-10-CM | POA: Diagnosis present

## 2017-09-29 DIAGNOSIS — F339 Major depressive disorder, recurrent, unspecified: Secondary | ICD-10-CM | POA: Diagnosis not present

## 2017-09-29 DIAGNOSIS — Z9889 Other specified postprocedural states: Secondary | ICD-10-CM | POA: Diagnosis not present

## 2017-09-29 DIAGNOSIS — T189XXA Foreign body of alimentary tract, part unspecified, initial encounter: Secondary | ICD-10-CM

## 2017-09-29 DIAGNOSIS — Z79899 Other long term (current) drug therapy: Secondary | ICD-10-CM | POA: Diagnosis not present

## 2017-09-29 DIAGNOSIS — Z8719 Personal history of other diseases of the digestive system: Secondary | ICD-10-CM | POA: Diagnosis not present

## 2017-09-29 DIAGNOSIS — Z6841 Body Mass Index (BMI) 40.0 and over, adult: Secondary | ICD-10-CM

## 2017-09-29 DIAGNOSIS — K5520 Angiodysplasia of colon without hemorrhage: Secondary | ICD-10-CM

## 2017-09-29 DIAGNOSIS — D500 Iron deficiency anemia secondary to blood loss (chronic): Secondary | ICD-10-CM | POA: Diagnosis present

## 2017-09-29 DIAGNOSIS — K269 Duodenal ulcer, unspecified as acute or chronic, without hemorrhage or perforation: Secondary | ICD-10-CM | POA: Diagnosis present

## 2017-09-29 DIAGNOSIS — R233 Spontaneous ecchymoses: Secondary | ICD-10-CM | POA: Diagnosis not present

## 2017-09-29 DIAGNOSIS — K5521 Angiodysplasia of colon with hemorrhage: Secondary | ICD-10-CM | POA: Diagnosis not present

## 2017-09-29 DIAGNOSIS — Z886 Allergy status to analgesic agent status: Secondary | ICD-10-CM

## 2017-09-29 LAB — CBC
HEMATOCRIT: 16.5 % — AB (ref 36.0–46.0)
Hemoglobin: 4.7 g/dL — CL (ref 12.0–15.0)
MCH: 28.3 pg (ref 26.0–34.0)
MCHC: 28.5 g/dL — AB (ref 30.0–36.0)
MCV: 99.4 fL (ref 78.0–100.0)
PLATELETS: 292 10*3/uL (ref 150–400)
RBC: 1.66 MIL/uL — ABNORMAL LOW (ref 3.87–5.11)
RDW: 24.8 % — AB (ref 11.5–15.5)
WBC: 19.5 10*3/uL — AB (ref 4.0–10.5)

## 2017-09-29 LAB — BASIC METABOLIC PANEL
Anion gap: 9 (ref 5–15)
BUN: 24 mg/dL — AB (ref 6–20)
CALCIUM: 8.2 mg/dL — AB (ref 8.9–10.3)
CO2: 25 mmol/L (ref 22–32)
CREATININE: 0.91 mg/dL (ref 0.44–1.00)
Chloride: 108 mmol/L (ref 101–111)
GFR calc non Af Amer: >60 mL/min (ref >60)
Glucose, Bld: 92 mg/dL (ref 65–99)
Potassium: 4 mmol/L (ref 3.5–5.1)
SODIUM: 142 mmol/L (ref 135–145)

## 2017-09-29 LAB — PREPARE RBC (CROSSMATCH)

## 2017-09-29 MED ORDER — SODIUM CHLORIDE 0.9 % IV SOLN
Freq: Once | INTRAVENOUS | Status: AC
  Administered 2017-09-29: 18:00:00 via INTRAVENOUS

## 2017-09-29 MED ORDER — FAMOTIDINE IN NACL 20-0.9 MG/50ML-% IV SOLN
20.0000 mg | Freq: Two times a day (BID) | INTRAVENOUS | Status: DC
  Administered 2017-09-29 – 2017-09-30 (×2): 20 mg via INTRAVENOUS
  Filled 2017-09-29 (×2): qty 50

## 2017-09-29 MED ORDER — SODIUM CHLORIDE 0.9 % IV BOLUS
1000.0000 mL | Freq: Once | INTRAVENOUS | Status: AC
Start: 2017-09-29 — End: 2017-09-29
  Administered 2017-09-29: 1000 mL via INTRAVENOUS

## 2017-09-29 MED ORDER — LACTATED RINGERS IV SOLN
INTRAVENOUS | Status: DC
  Administered 2017-09-29 – 2017-09-30 (×2): via INTRAVENOUS

## 2017-09-29 MED ORDER — SODIUM CHLORIDE 0.9 % IV SOLN: Freq: Once | INTRAVENOUS | Status: DC

## 2017-09-29 MED ORDER — SODIUM CHLORIDE 0.9% FLUSH
3.0000 mL | Freq: Two times a day (BID) | INTRAVENOUS | Status: DC
  Administered 2017-09-29 – 2017-10-03 (×5): 3 mL via INTRAVENOUS

## 2017-09-29 NOTE — Assessment & Plan Note (Addendum)
Patient has a history of chronic anemia suspected secondary to hereditary hemorrhagic telangiectasias associated with recurrent epistaxis and gastric AVM bleeding was recently admitted from 6/9-6/13/2019 for an upper GI bleed. She initially presented with coffee-ground emesis, then hematemesis was found to have a hemoglobin of 3.9.  She required intubation and care in the ICU and underwent mass transfusion protocol with transfusion of 8 units PRBCs and 2 units FFP's.  She underwent upper endoscopy which showed a bleeding duodenal ulcer which was clipped resulting in hemostasis.  Her hemoglobin was 8.4 on the day of discharge with a blood pressure 118/58.  She was noted to have continued melenic stool on discharge.  Since discharge patient denies any further emesis but does have some nausea.  She reports continued dark black-colored stool 2 or 3 times per day.  She says she has not taking her iron supplement for the last few days.  She reports occasional lightheadedness when standing up quickly.  She denies any chest pain, dyspnea, loss of consciousness, falls, abdominal pain, reflux, cough, or bright red blood in her stool.  She says she is not sleeping well and feels tired.  She is using Tylenol as needed for pain.  She says she is avoiding all NSAIDs A/P: Patient with chronic anemia secondary to hereditary hemorrhagic college ectasias with recurrent epistaxis and gastric AVM bleeding recently admitted for upper GI bleeding secondary to bleeding duodenal ulcer has been clipped.  She reports ongoing melena since discharge.  Stat CBC in clinic shows a hemoglobin of 4.7 compared to 8.4 on 09/25/2017.  She is having ongoing upper GI bleeding for which she will be admitted.  Plan to admit her to the stepdown unit for transfusion of PRBCs and IV fluid resuscitation.  Recommend IV Protonix 40 mg twice daily.  May need repeat upper endoscopy or further evaluation for small bowel bleeding.  We are transfusing 1 L of  normal saline in clinic.  I have placed basic admission orders and discussed with the admitting team who have accepted to continue to care for her while inpatient.

## 2017-09-29 NOTE — Telephone Encounter (Signed)
Patient completed HFU today. L. Yuvaan Olander, RN, BSN   

## 2017-09-29 NOTE — Progress Notes (Signed)
CC: GI bleed  HPI:  [name] is a 57 y.o. female with chronic anemia secondary to hereditary hemorrhagic telangiectasias with recurrent epistaxis and gastric AVM bleeding, HTN, HLD, depression, and obesity who presents for hospital follow up after admission for upper GI bleed secondary to bleeding duodenal ulcer. Please see problem based charting for status of patient's chronic medical issues.  GI bleed Patient has a history of chronic anemia suspected secondary to hereditary hemorrhagic telangiectasias associated with recurrent epistaxis and gastric AVM bleeding was recently admitted from 6/9-6/13/2019 for an upper GI bleed. She initially presented with coffee-ground emesis, then hematemesis was found to have a hemoglobin of 3.9.  She required intubation and care in the ICU and underwent mass transfusion protocol with transfusion of 8 units PRBCs and 2 units FFP's.  She underwent upper endoscopy which showed a bleeding duodenal ulcer which was clipped resulting in hemostasis.  Her hemoglobin was 8.4 on the day of discharge with a blood pressure 118/58.  She was noted to have continued melenic stool on discharge.  Since discharge patient denies any further emesis but does have some nausea.  She reports continued dark black-colored stool 2 or 3 times per day.  She says she has not taking her iron supplement for the last few days.  She reports occasional lightheadedness when standing up quickly.  She denies any chest pain, dyspnea, loss of consciousness, falls, abdominal pain, reflux, cough, or bright red blood in her stool.  She says she is not sleeping well and feels tired.  She is using Tylenol as needed for pain.  She says she is avoiding all NSAIDs A/P: Patient with chronic anemia secondary to hereditary hemorrhagic college ectasias with recurrent epistaxis and gastric AVM bleeding recently admitted for upper GI bleeding secondary to bleeding duodenal ulcer has been clipped.  She reports  ongoing melena since discharge.  Stat CBC in clinic shows a hemoglobin of 4.7 compared to 8.4 on 09/25/2017.  She is having ongoing upper GI bleeding for which she will be admitted.  Plan to admit her to the stepdown unit for transfusion of PRBCs and IV fluid resuscitation.  Recommend IV Protonix 40 mg twice daily.  May need repeat upper endoscopy or further evaluation for small bowel bleeding.  We are transfusing 1 L of normal saline in clinic.  I have placed basic admission orders and discussed with the admitting team who have accepted to continue to care for her while inpatient.    Past Medical History:  Diagnosis Date  . Anxiety   . Arthritis    knnes,back  . GERD (gastroesophageal reflux disease)   . History of swelling of feet   . Hyperlipidemia   . Hypertension   . Major depressive disorder, recurrent episode ([place]) 06/05/2015  . Obesity   . Snores    Review of Systems:   Review of Systems  Constitutional: Positive for malaise/fatigue. Negative for diaphoresis.  HENT: Negative for nosebleeds.   Respiratory: Negative for cough, hemoptysis and shortness of breath.   Cardiovascular: Negative for chest pain.  Gastrointestinal: Positive for melena and nausea. Negative for abdominal pain, constipation, heartburn and vomiting.       Dark black loose stools  Genitourinary: Negative for hematuria.  Musculoskeletal: Negative for falls.  Neurological: Positive for dizziness and weakness. Negative for loss of consciousness.  Psychiatric/Behavioral: The patient has insomnia.      Physical Exam:  Vitals:   09/29/17 1335  BP: (!) 112/52  Pulse: 71  Temp: 98.9  F (37.2 C)  TempSrc: Oral  SpO2: 100%  Weight: 252 lb 1.6 oz (114.4 kg)   Physical Exam  Constitutional: She is oriented to person, place, and time. She appears well-developed and well-nourished. No distress.  HENT:  Head: Normocephalic and atraumatic.  Mouth/Throat: Oropharynx is clear and moist.  Eyes: EOM are normal.    Slight conjunctival pallor  Cardiovascular: Normal rate, regular rhythm and intact distal pulses.  Murmur heard. 2/6 systolic murmur  Pulmonary/Chest: Effort normal. No respiratory distress. She has no wheezes. She has no rales.  Abdominal: Soft. Bowel sounds are normal. She exhibits no distension. There is no tenderness.  Musculoskeletal: She exhibits no tenderness.  Trace edema both legs  Neurological: She is alert and oriented to person, place, and time.  Skin: Capillary refill takes less than 2 seconds. She is not diaphoretic.     Assessment & Plan:   See Encounters Tab for problem based charting.  Patient discussed with Dr. Lynnae January

## 2017-09-29 NOTE — H&P (Addendum)
Date: 09/30/2017               Patient Name:  Peggy House MRN: [id number]  DOB: [birth year] Age / Sex: 57 y.o., female   PCP: Einar Gip, DO              Medical Service: Internal Medicine Teaching Service              Attending Physician: Dr. Evette Doffing, Mallie Mussel, *    First Contact: Orlene Erm, MS 4 Pager: [phone]  Second Contact: Dr. Hetty Ely Pager: [phone]            After Hours (After 5p/  First Contact Pager: [phone]  weekends / holidays): Second Contact Pager: [phone]   Chief Complaint: "I just came here for a regular doctor's appointment"  History of Present Illness:  [name] is a 57 year old woman with a history of hereditary hemorrhagic telangiectasias with known AVM's and recent duodenal ulcer. AVMs were originally discovered on an EGD in 2016 when she found to have 1-2 oozing AVMs in the pylorus and duodenal bulb requiring argon plasma fulgation. She presents as a direct admit from clinic, where she was found to have worsening anemia at her hospital follow-up visit. She was recently admitted for symptomatic blood loss anemia with hematemesis and epistaxis requiring intubation and transfer to the ICU. During the hospitalization she had an EGD which revealed oozing cratered duodenal ulcer which were injected with epinephrine and had hemostatic clips applied, AVMs were not seen on this EGD. She required 8 units of PRBCs and 2 units of FFP prior to the emergent endoscopy. Last EGD prior to this month was in January 2019 when she was found to have 3 non bleeding angiodysplastic lesions, two of which were fulgated with argon plasma.   Over the past week following hospital admission, she has had ongoing melena, with 2-3 large black tarry stools per day. She denies BRBPR. She notes that she was not able to get to the pharmacy to pick up protonix after the hospitalization and has been out of the medication since prior to her original admission. Her son usually drives her to the  pharmacy however he has been busy with the three jobs that he works. The bleeding was associated with lightheadedness. She also reports nausea over the past 4 days. She has had poor PO intake due to the nausea. She has not vomited. She denies abdominal pain. She denies chest pain or shortness of breath. She has not been vomiting blood or had any nose bleeding. She also reports a headache.  Meds: Current Meds  Medication Sig  . acetaminophen (TYLENOL) 325 MG tablet Take 650 mg by mouth every 6 (six) hours as needed for mild pain or headache.   . albuterol (PROVENTIL HFA;VENTOLIN HFA) 108 (90 Base) MCG/ACT inhaler Inhale 1-2 puffs into the lungs every 6 (six) hours as needed for wheezing or shortness of breath.  [name] atorvastatin (LIPITOR) 80 MG tablet take 1 tablet by mouth once daily  . ferrous gluconate (FERGON) 324 MG tablet Take 1 tablet (324 mg total) by mouth 3 (three) times daily with meals. (Patient taking differently: Take 324 mg by mouth daily with breakfast. )  . ferrous sulfate 325 (65 FE) MG tablet Take 325 mg by mouth daily with breakfast.  . metoprolol succinate (TOPROL-XL) 100 MG 24 hr tablet Take 1 tablet (100 mg total) by mouth daily. Take with or immediately following a meal.  . pantoprazole (PROTONIX) 40 MG  tablet Take 1 tablet (40 mg total) by mouth 2 (two) times daily.  . sertraline (ZOLOFT) 50 MG tablet Take 1 tablet (50 mg total) by mouth daily.  . vitamin C (ASCORBIC ACID) 500 MG tablet Take 500 mg by mouth daily.     Allergies: Allergies as of 09/29/2017 - Review Complete 09/29/2017  Allergen Reaction Noted  . Nsaids Other (See Comments) 04/25/2014  . Tomato Hives 01/01/2012   Past Medical History:  Diagnosis Date  . Anxiety   . Arthritis    knnes,back  . GERD (gastroesophageal reflux disease)   . History of swelling of feet   . Hyperlipidemia   . Hypertension   . Major depressive disorder, recurrent episode ([place]) 06/05/2015  . Obesity   . Snores     Family  History: Her mother, sister, and son have bleeding disorders. Her mother had ovarian cancer. Her mother and both sisters have diabetes. Her mother and son have kidney disease.  Social History: She smokes 1/2 ppd. She does not smoke or use drugs. She lives with her husband.  Review of Systems: A complete ROS was negative except as per HPI.  Physical Exam: Blood pressure 122/61, pulse 68, temperature 98.8 F (37.1 C), temperature source Oral, resp. rate 16, height 5\' 5"  (1.651 m), weight 252 lb 1.6 oz (114.4 kg), SpO2 99 %. GENERAL: Well-appearing woman lying on stretcher. Appears older than stated age. HEENT: Conjunctiva pale. EOMI. PEERL. Petechiae visibile on palate and upper lip NEURO: Mental status not formally assessed but grossly intact. Strength and coordination grossly intact. CARDIOVASCULAR: Regular rate and rhythm. 2/6 blowing crescendo-decrescendo systolic murmur heard throughout the precordium but loudest at the left second intercostal space. PULM: Normal work of breathing. Lungs clear to auscultation bilaterally with no wheezes, rales, or ronchi ABDOMINAL: Non-distended. Mild tenderness to palpation in right upper and right lower quadrants. Mild to moderate tenderness to palpation in epigastric region. Normoactive bowel sounds.  EXTREMITIES: 2+ radial and 1+ dorsalis pedis pulses. Extremities are warm and well-perfused SKIN: Small telangiectasias over the right palm, no rashes on the exposed skin of the arms, chest, or legs  PSYCH: normal affect, dressed appropriate   EKG: no EKG available  CXR: no CXR available  Prior GI workup: EGD 03/31/14 revealed multiple small nonbleeding AVMs. EGD 04/27/14 revealed an actively oozing gastric lesion, probably a small AVM versus pinpoint ulceration, as well as several tiny vascular ectasias scattered in the stomach and proximal duodenum. Colonoscopy 04/28/14 revealed multiple polyps but no vascular ectasias. EGD 05/10/17 revealed three  non-bleeding angiodysplastic lesions in the stomach. EGD 09/22/17 revealed hematin in stomach and one oozing duodenal ulcer.    Assessment & Plan by Problem: Active Problems:   Essential hypertension   Hereditary hemorrhagic telangiectasia (HCC)   Upper GI bleed  [name] is a 57 year old woman with a history of hereditary hemorrhagic telangiectasias and a known bleeding duodenal ulcer 1 week ago admitted for recurrent anemia in the setting of ongoing GI bleed.  GI bleed Mrs. Isley's ongoing melena and hemoglobin drop from 8.4 to 4.7 over the past 4 days is consistent with ongoing GI bleed. She has a history of AVM's in her stomach and duodenum. See prior GI workup above. She also had an oozing duodenal ulcer visualized on EGD 7/42/59 that could certainly be the source of her bleeding. Given her history of hereditary hemorrhagic telangiectasias, she is at high risk for having multiple AVM's throughout her GI tract, and she may in fact have  a chronic GI bleed. BUN of 24 is somewhat consistent with an upper GI bleed. [facility]. Appreciate recommendations and assistance - 2 units pRBC's - 1 L normal saline - Repeat CBC 6/18 am - IV Pepcid 20 mg q12h. IV PPI's on shortage - Follow up PT/INR and PTT  Acute on chronic blood loss anemia secondary to hereditary hemorrhagic telangiectasias She remains hemodynamically stable at this time, with vital signs within normal limits. Her light-headedness and loss of peripheral vision upon standing are consistent with acute blood loss anemia, but she likely compensates well given her chronic anemia (baseline approximately 7 to 8). - 2 units pRBC's - Hold iron supplementation, as the GI side effects may cloud the picture - Management of GI blood loss as above - Consider initiation of systemic therapy (I.e. Tamoxifen,  tranexamic acid) given recurrent hospitalizations  Hyperlipidemia - Continue home atorvastatin 80 mg  QD  Hypertension Hold home metoprolol given active bleeding, normal blood pressure, and possible need for reflex tachycardia if she becomes more anemic.  Major depressive disorder - Continue home Zoloft 50 mg QD  DVT ppx: SCD's GI ppx: Pepcid Diet: Regular diet. NPO at midnight for possible EGD tomorrow Dispo: Admit patient to Inpatient with expected length of stay greater than 2 midnights.  Code status: Full code  Signed: Orlene Erm [facility]  09/30/2017, 7:09 AM  Pager: [phone]  Attestation for Student Documentation:  I personally was present and performed or re-performed the history, physical exam and medical decision-making activities of this service and have verified that the service and findings are accurately documented in the student's note.  Ledell Noss, MD 09/30/2017, 7:12 AM

## 2017-09-30 ENCOUNTER — Encounter (HOSPITAL_COMMUNITY)
Admission: AD | Disposition: A | Discharge status: Home / Self Care | Payer: Self-pay | Source: Ambulatory Visit | Attending: Student in an Organized Health Care Education/Training Program

## 2017-09-30 ENCOUNTER — Inpatient Hospital Stay (HOSPITAL_COMMUNITY): Payer: Medicaid Other | Admitting: Anesthesiology

## 2017-09-30 ENCOUNTER — Encounter (HOSPITAL_COMMUNITY): Payer: Self-pay

## 2017-09-30 DIAGNOSIS — Z90710 Acquired absence of both cervix and uterus: Secondary | ICD-10-CM

## 2017-09-30 HISTORY — PX: HOT HEMOSTASIS: SHX5433

## 2017-09-30 HISTORY — PX: ESOPHAGOGASTRODUODENOSCOPY (EGD) WITH PROPOFOL: SHX5813

## 2017-09-30 LAB — BASIC METABOLIC PANEL
ANION GAP: 3 — AB (ref 5–15)
BUN: 23 mg/dL — AB (ref 6–20)
CHLORIDE: 116 mmol/L — AB (ref 101–111)
CO2: 23 mmol/L (ref 22–32)
Calcium: 7.7 mg/dL — ABNORMAL LOW (ref 8.9–10.3)
Creatinine, Ser: 0.71 mg/dL (ref 0.44–1.00)
GFR calc Af Amer: >60 mL/min (ref >60)
GFR calc non Af Amer: >60 mL/min (ref >60)
GLUCOSE: 95 mg/dL (ref 65–99)
Potassium: 3.5 mmol/L (ref 3.5–5.1)
Sodium: 142 mmol/L (ref 135–145)

## 2017-09-30 LAB — HEMOGLOBIN AND HEMATOCRIT, BLOOD
HCT: 20.7 % — ABNORMAL LOW (ref 36.0–46.0)
Hemoglobin: 6.4 g/dL — CL (ref 12.0–15.0)

## 2017-09-30 LAB — POCT I-STAT 4, (NA,K, GLUC, HGB,HCT)
Glucose, Bld: 108 mg/dL — ABNORMAL HIGH (ref 65–99)
HEMATOCRIT: 17 % — AB (ref 36.0–46.0)
HEMOGLOBIN: 5.8 g/dL — AB (ref 12.0–15.0)
Potassium: 4.3 mmol/L (ref 3.5–5.1)
SODIUM: 146 mmol/L — AB (ref 135–145)

## 2017-09-30 LAB — CBC
HEMATOCRIT: 17.3 % — AB (ref 36.0–46.0)
HEMOGLOBIN: 5.3 g/dL — AB (ref 12.0–15.0)
MCH: 29.4 pg (ref 26.0–34.0)
MCHC: 30.6 g/dL (ref 30.0–36.0)
MCV: 96.1 fL (ref 78.0–100.0)
Platelets: 202 10*3/uL (ref 150–400)
RBC: 1.8 MIL/uL — ABNORMAL LOW (ref 3.87–5.11)
RDW: 22 % — AB (ref 11.5–15.5)
WBC: 16.7 10*3/uL — ABNORMAL HIGH (ref 4.0–10.5)

## 2017-09-30 LAB — PROTIME-INR
INR: 1.25
Prothrombin Time: 15.6 seconds — ABNORMAL HIGH (ref 11.4–15.2)

## 2017-09-30 LAB — PREPARE RBC (CROSSMATCH)

## 2017-09-30 LAB — APTT: aPTT: 27 seconds (ref 24–36)

## 2017-09-30 SURGERY — ESOPHAGOGASTRODUODENOSCOPY (EGD) WITH PROPOFOL
Anesthesia: General

## 2017-09-30 MED ORDER — ACETAMINOPHEN 325 MG PO TABS: 650.0000 mg | ORAL_TABLET | Freq: Four times a day (QID) | ORAL | Status: DC | PRN

## 2017-09-30 MED ORDER — PROPOFOL 10 MG/ML IV BOLUS
INTRAVENOUS | Status: DC | PRN
  Administered 2017-09-30: 50 mg via INTRAVENOUS

## 2017-09-30 MED ORDER — PANTOPRAZOLE SODIUM 40 MG IV SOLR
40.0000 mg | Freq: Two times a day (BID) | INTRAVENOUS | Status: DC
Start: 2017-10-04 — End: 2017-10-01

## 2017-09-30 MED ORDER — SODIUM CHLORIDE 0.9 % IV SOLN
INTRAVENOUS | Status: DC
  Administered 2017-09-30: 12:00:00 via INTRAVENOUS

## 2017-09-30 MED ORDER — FERROUS SULFATE 325 (65 FE) MG PO TABS
325.0000 mg | ORAL_TABLET | Freq: Every day | ORAL | Status: DC
  Administered 2017-10-03: 325 mg via ORAL
  Filled 2017-09-30 (×3): qty 1

## 2017-09-30 MED ORDER — PHENYLEPHRINE 40 MCG/ML (10ML) SYRINGE FOR IV PUSH (FOR BLOOD PRESSURE SUPPORT)
PREFILLED_SYRINGE | INTRAVENOUS | Status: DC | PRN
  Administered 2017-09-30: 80 ug via INTRAVENOUS

## 2017-09-30 MED ORDER — SODIUM CHLORIDE 0.9 % IV SOLN
8.0000 mg/h | INTRAVENOUS | Status: DC
  Administered 2017-09-30 – 2017-10-01 (×2): 8 mg/h via INTRAVENOUS
  Filled 2017-09-30 (×3): qty 80

## 2017-09-30 MED ORDER — ATORVASTATIN CALCIUM 80 MG PO TABS
80.0000 mg | ORAL_TABLET | Freq: Every day | ORAL | Status: DC
  Administered 2017-09-30 – 2017-10-03 (×3): 80 mg via ORAL
  Filled 2017-09-30 (×4): qty 1

## 2017-09-30 MED ORDER — SODIUM CHLORIDE 0.9% IV SOLUTION: Freq: Once | INTRAVENOUS | Status: DC

## 2017-09-30 MED ORDER — METOPROLOL SUCCINATE ER 100 MG PO TB24
100.0000 mg | ORAL_TABLET | Freq: Every day | ORAL | Status: DC
  Administered 2017-09-30 – 2017-10-03 (×3): 100 mg via ORAL
  Filled 2017-09-30 (×4): qty 1

## 2017-09-30 MED ORDER — VITAMIN C 500 MG PO TABS
500.0000 mg | ORAL_TABLET | Freq: Every day | ORAL | Status: DC
  Administered 2017-10-01 – 2017-10-03 (×2): 500 mg via ORAL
  Filled 2017-09-30 (×3): qty 1

## 2017-09-30 MED ORDER — LIDOCAINE 2% (20 MG/ML) 5 ML SYRINGE
INTRAMUSCULAR | Status: DC | PRN
  Administered 2017-09-30: 80 mg via INTRAVENOUS

## 2017-09-30 MED ORDER — SERTRALINE HCL 50 MG PO TABS
50.0000 mg | ORAL_TABLET | Freq: Every day | ORAL | Status: DC
  Administered 2017-09-30 – 2017-10-03 (×4): 50 mg via ORAL
  Filled 2017-09-30 (×4): qty 1

## 2017-09-30 MED ORDER — ALBUTEROL SULFATE (2.5 MG/3ML) 0.083% IN NEBU: 3.0000 mL | INHALATION_SOLUTION | Freq: Four times a day (QID) | RESPIRATORY_TRACT | Status: DC | PRN

## 2017-09-30 MED ORDER — SUCCINYLCHOLINE CHLORIDE 200 MG/10ML IV SOSY
PREFILLED_SYRINGE | INTRAVENOUS | Status: DC | PRN
  Administered 2017-09-30: 120 mg via INTRAVENOUS

## 2017-09-30 MED ORDER — METOCLOPRAMIDE HCL 5 MG/ML IJ SOLN
10.0000 mg | Freq: Four times a day (QID) | INTRAMUSCULAR | Status: DC
  Administered 2017-09-30 – 2017-10-01 (×3): 10 mg via INTRAVENOUS
  Filled 2017-09-30 (×3): qty 2

## 2017-09-30 MED ORDER — PANTOPRAZOLE SODIUM 40 MG PO TBEC: 40.0000 mg | DELAYED_RELEASE_TABLET | Freq: Two times a day (BID) | ORAL | Status: DC

## 2017-09-30 MED ORDER — SODIUM CHLORIDE 0.9 % IV SOLN
80.0000 mg | Freq: Once | INTRAVENOUS | Status: AC
  Administered 2017-09-30: 80 mg via INTRAVENOUS
  Filled 2017-09-30: qty 80

## 2017-09-30 MED ORDER — SODIUM CHLORIDE 0.9 % IV SOLN
500.0000 mg | Freq: Four times a day (QID) | INTRAVENOUS | Status: DC
  Filled 2017-09-30 (×3): qty 10

## 2017-09-30 MED ORDER — SODIUM CHLORIDE 0.9 % IV SOLN
Freq: Once | INTRAVENOUS | Status: AC
  Administered 2017-09-30: 06:00:00 via INTRAVENOUS

## 2017-09-30 MED ORDER — PROPOFOL 500 MG/50ML IV EMUL
INTRAVENOUS | Status: DC | PRN
  Administered 2017-09-30: 125 ug/kg/min via INTRAVENOUS

## 2017-09-30 SURGICAL SUPPLY — 15 items

## 2017-09-30 NOTE — Progress Notes (Signed)
Internal Medicine Clinic Attending  Case discussed with Dr. Patel at the time of the visit.  We reviewed the resident's history and exam and pertinent patient test results.  I agree with the assessment, diagnosis, and plan of care documented in the resident's note.  

## 2017-09-30 NOTE — Anesthesia Postprocedure Evaluation (Signed)
Anesthesia Post Note  Patient: Peggy House  Procedure(s) Performed: ESOPHAGOGASTRODUODENOSCOPY (EGD) WITH PROPOFOL (N/A ) HOT HEMOSTASIS (ARGON PLASMA COAGULATION/BICAP) (N/A )     Patient location during evaluation: PACU Anesthesia Type: General Level of consciousness: awake and alert Pain management: pain level controlled Vital Signs Assessment: post-procedure vital signs reviewed and stable Respiratory status: spontaneous breathing, nonlabored ventilation, respiratory function stable and patient connected to nasal cannula oxygen Cardiovascular status: blood pressure returned to baseline and stable Postop Assessment: no apparent nausea or vomiting Anesthetic complications: no    Last Vitals:  Vitals:   09/30/17 1320 09/30/17 1357  BP: 118/63   Pulse: 70   Resp:  11  Temp: 36.8 C 37.1 C  SpO2: 100% 100%    Last Pain:  Vitals:   09/30/17 1357  TempSrc: Oral  PainSc: 0-No pain                 Slayde Brault S

## 2017-09-30 NOTE — Consult Note (Signed)
Subjective:   HPI  The patient is a 56-year-old female with a history of hereditary hemorrhagic telangiectasia. She was in this hospital earlier this month with a GI bleed and had an EGD showing a bleeding duodenal ulcer which required clipping and injection of epinephrine for control of bleeding. There were no obvious telangiectasias seen on this most recent EGD. She was discharged from the hospital and in reviewing her labs her last hemoglobin on June 13 was 8.4. She went to a doctor follow-up yesterday and was found to have a hemoglobin of 4.7 and was directly admitted. The patient reports to me that she has been having melena since discharge. Denies hematemesis or abdominal pain. She is currently receiving a blood transfusion. On her last admission this month she required 8 units of packed red cells and 2 units fresh frozen plasma and review of records. She had an EGD in January of this year and found to have angiodysplasia 2 of which were fulgurated with the argon plasma coagulator. She had a colonoscopy in 2016 and no vascular ectasia were seen on that examination.  Review of Systems No chest pain or shortness of breath  Past Medical History:  Diagnosis Date  . Anxiety   . Arthritis    knnes,back  . GERD (gastroesophageal reflux disease)   . History of swelling of feet   . Hyperlipidemia   . Hypertension   . Major depressive disorder, recurrent episode (HCC) 06/05/2015  . Obesity   . Snores    Past Surgical History:  Procedure Laterality Date  . ABDOMINAL HYSTERECTOMY    . CARPAL TUNNEL RELEASE  05/13/2011   Procedure: CARPAL TUNNEL RELEASE;  Surgeon: Justin William Chandler, MD;  Location: [facility];  Service: Orthopedics;  Laterality: Left;  . COLONOSCOPY WITH PROPOFOL N/A 04/28/2014   Procedure: COLONOSCOPY WITH PROPOFOL;  Surgeon: Robert Buccini V, MD;  Location: [facility];  Service: Endoscopy;  Laterality: N/A;  . DG TOES*L*  2/10   rt  . DILATION AND  CURETTAGE OF UTERUS    . ESOPHAGOGASTRODUODENOSCOPY N/A 04/10/2014   Procedure: ESOPHAGOGASTRODUODENOSCOPY (EGD);  Surgeon: Vincent C. Schooler, MD;  Location: [facility];  Service: Endoscopy;  Laterality: N/A;  . ESOPHAGOGASTRODUODENOSCOPY N/A 05/10/2017   Procedure: ESOPHAGOGASTRODUODENOSCOPY (EGD);  Surgeon: Buccini, Robert, MD;  Location: [facility];  Service: Endoscopy;  Laterality: N/A;  . ESOPHAGOGASTRODUODENOSCOPY N/A 09/22/2017   Procedure: ESOPHAGOGASTRODUODENOSCOPY (EGD);  Surgeon: Magod, Marc, MD;  Location: [facility];  Service: Endoscopy;  Laterality: N/A;  bedside  . ESOPHAGOGASTRODUODENOSCOPY (EGD) WITH PROPOFOL N/A 04/27/2014   Procedure: ESOPHAGOGASTRODUODENOSCOPY (EGD) WITH PROPOFOL;  Surgeon: Robert Buccini V, MD;  Location: [facility];  Service: Endoscopy;  Laterality: N/A;  possible apc  . HOT HEMOSTASIS N/A 04/27/2014   Procedure: HOT HEMOSTASIS (ARGON PLASMA COAGULATION/BICAP);  Surgeon: Robert Buccini V, MD;  Location: [facility];  Service: Endoscopy;  Laterality: N/A;  . L shoulder Surgery  2011  . SUBMUCOSAL INJECTION  09/22/2017   Procedure: SUBMUCOSAL INJECTION;  Surgeon: Magod, Marc, MD;  Location: [facility];  Service: Endoscopy;;   Social History   Socioeconomic History  . Marital status: Single    Spouse name: Not on file  . Number of children: Not on file  . Years of education: Not on file  . Highest education level: Not on file  Occupational History  . Not on file  Social Needs  . Financial resource strain: Not on file  . Food insecurity:    Worry: Not on file      Inability: Not on file  . Transportation needs:    Medical: Not on file    Non-medical: Not on file  Tobacco Use  . Smoking status: Current Every Day Smoker    Packs/day: 0.50    Years: 30.00    Pack years: 15.00    Types: Cigarettes  . Smokeless tobacco: Former User    Types: Snuff    Quit date: 1981  . Tobacco comment: 1/2 PPD  Substance and Sexual Activity  . Alcohol  use: No    Alcohol/week: 0.0 oz  . Drug use: No    Comment: last cocaine-2010  . Sexual activity: Yes    Birth control/protection: Surgical    Comment: Hysterectomy  Lifestyle  . Physical activity:    Days per week: Not on file    Minutes per session: Not on file  . Stress: Not on file  Relationships  . Social connections:    Talks on phone: Not on file    Gets together: Not on file    Attends religious service: Not on file    Active member of club or organization: Not on file    Attends meetings of clubs or organizations: Not on file    Relationship status: Not on file  . Intimate partner violence:    Fear of current or ex partner: Not on file    Emotionally abused: Not on file    Physically abused: Not on file    Forced sexual activity: Not on file  Other Topics Concern  . Not on file  Social History Narrative  . Not on file   family history includes Bleeding Disorder in her mother, sister, and son; Cancer in her mother; Colon cancer in her maternal grandfather; Crohn's disease in her maternal grandfather; Diabetes in her mother and sister; Diabetes type II in her sister; Kidney disease in her mother and son; Ovarian cancer in her mother; Stomach cancer in her maternal grandmother.  Current Facility-Administered Medications:  .  0.9 %  sodium chloride infusion, , Intravenous, Once, Patel, Vishal, MD .  famotidine (PEPCID) IVPB 20 mg premix, 20 mg, Intravenous, Q12H, Blum, Nina, MD, Stopped at 09/29/17 2115 .  lactated ringers infusion, , Intravenous, Continuous, Blum, Nina, MD, Stopped at 09/30/17 0531 .  sodium chloride flush (NS) 0.9 % injection 3 mL, 3 mL, Intravenous, Q12H, Patel, Vishal, MD, 3 mL at 09/29/17 2035 Allergies  Allergen Reactions  . Nsaids Other (See Comments)    Stomach bleeding episodes; Tylenol is OK  . Tomato Hives     Objective:     BP (!) 117/50   Pulse 69   Temp 98.5 F (36.9 C) (Oral)   Resp 18   Ht 5' 5" (1.651 m)   Wt 114.4 kg (252 lb  1.6 oz)   SpO2 99%   BMI 41.95 kg/m   No acute distress  Alert and oriented  Heart regular rhythm no murmurs  Lungs clear  Abdomen: Bowel sounds present, soft, nontender, no hepatosplenomegaly  Laboratory No components found for: D1    Assessment:     Blood loss anemia  Recent bleeding duodenal ulcer  Hereditary hemorrhagic telangiectasia        Plan:     The patient has continued to have melena since discharge. I think it would be important to determine if the cratered previously bleeding duodenal ulcer is still causing a problem of bleeding. She is currently receiving blood transfusion. We will proceed with EGD this afternoon to determine if   the ulcer is present or not or is continuing to bleed. Another possible reason for bleeding of course would be further telangiectasia scattered throughout her intestinal tract. She had a colonoscopy in 2016 and no vascular ectasia were seen on that examination. 

## 2017-09-30 NOTE — Op Note (Signed)
Push enteroscopy was performed with a pediatric colonoscope for melena.  Findings: Normal-appearing esophagus, Z line regular at 40 cm from insertion. A large clot found in the cardia, fundus and proximal gastric body, underlying mucosa could not be evaluated. Patient vomited coffee-ground material during the procedure and was electively intubated. Previously placed endoclips noted in duodenal bulb, minimal oozing noted from the site, treated with APC. Scope advanced to proximal jejunum, 2 nonbleeding AVMs noted, treated with APC. An attempt was made to remove large clot  but could not be removed due to its large size.  Recommendations: Keep patient nothing by mouth. Protonix drip at 8 mg IV per hour. Erythromycin 500 mg IV 4 doses. Reglan 10 mg IV 4 doses. Repeat EGD in a.m.. Monitor H&H and transfuse if hemoglobin is less than 7.  Therisa Doyne, M.D.

## 2017-09-30 NOTE — Interval H&P Note (Signed)
History and Physical Interval Note:  56/female with melena, recent bleeding duodenal ulcer, presented with Hb of 4.7, has received 4 units PRBC transfusion and is scheduled for an EGD, possible pillcam deployment.  09/30/2017 2:16 PM  Peggy House  has presented today for EGD, possible pillcam deployment, with the diagnosis of melena, anemia  The various methods of treatment have been discussed with the patient and family. After consideration of risks, benefits and other options for treatment, the patient has consented to  Procedure(s): ESOPHAGOGASTRODUODENOSCOPY (EGD) WITH PROPOFOL (N/A) as a surgical intervention .  The patient's history has been reviewed, patient examined, no change in status, stable for surgery.  I have reviewed the patient's chart and labs.  Questions were answered to the patient's satisfaction.     [name]

## 2017-09-30 NOTE — Progress Notes (Addendum)
   Subjective: [name] is feeling better this morning. She denies dyspnea or fatigue. Her light-headedness and visual disturbances have improved. She had a hysterectomy approximately 10 years ago, but she is not sure if she has gone through menopause or not. She denies hot flashes.  Objective: Vital signs in last 24 hours: Vitals:   09/30/17 0551 09/30/17 0829 09/30/17 0908 09/30/17 0923  BP: 122/61 (!) 128/50 (!) 120/48 (!) 117/50  Pulse: 68 66 67 69  Resp: 16 18 15 18   Temp: 98.8 F (37.1 C) 98.8 F (37.1 C) 98.6 F (37 C) 98.5 F (36.9 C)  TempSrc: Oral Oral Oral Oral  SpO2: 99% 100% 100% 99%  Weight:      Height:       Weight change:   Intake/Output Summary (Last 24 hours) at 09/30/2017 1015 Last data filed at 09/30/2017 0900 Gross per 24 hour  Intake 1205.42 ml  Output 995 ml  Net 210.42 ml   GENERAL: Well-appearing woman resting comfortably in bed. Appears older than stated age. HEENT: Petechiae visibile on palate and upper lip NEURO: Mental status not formally assessed but grossly intact. Strength and coordination grossly intact. CARDIOVASCULAR: Regular rate and rhythm. 2/6 blowing crescendo-decrescendo systolic murmur heard throughout the precordium but loudest at the left second intercostal space. PULM: Normal work of breathing. Lungs clear to auscultation bilaterally with no wheezes, rales, or ronchi ABDOMINAL: Non-distended. Mild tenderness to palpation in right upper and left upper quadrants and in epigastrium.gion. Normoactive bowel sounds. EXTREMITIES: 2+ radial and 1+ dorsalis pedis pulses. Extremities are warm and well-perfused SKIN: Small telangiectasias over the right palm, no rashes on the exposed skin of the arms, chest, or legs  Assessment/Plan:  Principal Problem:   Upper GI bleed Active Problems:   Acute blood loss anemia   HLD (hyperlipidemia)   Essential hypertension   Major depressive disorder, recurrent episode (HCC)   Hereditary hemorrhagic  telangiectasia (HCC)  GI bleed Hemoglobin rose from 4.7 to 5.3 with 2 units pRBC's. Such a modest rise with 2 units is suggestive of ongoing blood loss. She is hemodynamically stable, but light-headedness and peripheral vision loss are signs of end organ hypoperfusion. Given her frequent recurrent GI bleeds and symptomatic anemia with clinical signs of cerebral hypoperfusion, she is a candidate for systemic therapy. - Appreciate recommendations and assistance from Dr. Penelope House (GI). Planning for EGD today - Transfuse 2 additional units pRBC's - follow up post transfusion H&H - Repeat H/H tomorrow morning - IV Pepcid q12h - Consider initiation of systemic therapy (tamoxifen or tranexamic acid)  DVT ppx: SCD's GI ppx: Pepcid Diet: NPO for EGD Dispo: Stepdown level. Earliest forseeable discharge 6/19 Code status: Full code   LOS: 1 day   Doristine Johns, Medical Student 09/30/2017, 10:15 AM   Attestation for Student Documentation:  I personally was present and performed or re-performed the history, physical exam and medical decision-making activities of this service and have verified that the service and findings are accurately documented in the student's note.  Ledell Noss, MD 09/30/2017, 11:53 AM

## 2017-09-30 NOTE — Anesthesia Procedure Notes (Signed)
Procedure Name: Intubation Date/Time: 09/30/2017 2:59 PM Performed by: Renato Shin, CRNA Pre-anesthesia Checklist: Patient identified, Emergency Drugs available, Suction available and Patient being monitored Patient Re-evaluated:Patient Re-evaluated prior to induction Oxygen Delivery Method: Circle system utilized Preoxygenation: Pre-oxygenation with 100% oxygen Induction Type: IV induction, Rapid sequence and Cricoid Pressure applied Laryngoscope Size: Miller and 2 Grade View: Grade I Tube type: Oral Tube size: 7.0 mm Number of attempts: 1 Airway Equipment and Method: Stylet Placement Confirmation: ETT inserted through vocal cords under direct vision,  positive ETCO2,  CO2 detector and breath sounds checked- equal and bilateral Secured at: 21 cm Tube secured with: Tape Dental Injury: Teeth and Oropharynx as per pre-operative assessment

## 2017-09-30 NOTE — Progress Notes (Signed)
Notified first contact MD regarding post transfusion hgb 5.3. New orders to be received for pRBC's.

## 2017-09-30 NOTE — Progress Notes (Addendum)
[facility] INTERVAL PROGRESS NOTE  Notified that patient had complications during EGD and MD requested to evaluate at bedside. Patient seen in recovery room after procedure. She was still drowsy from anesthesia but in good spirits when we were talking. VSS.  RN notified me that patient was intubated during EGD which revealed a gastric ulcer and significant amount of blood in her GI tract. Also informed procedure would likely be repeated tomorrow and that 2 units PRBs were ordered by GI as her Hb was 5.8 after the procedure.   Reviewed op note which noted clotted blood throughout stomach and 2 bleeding angiodysplastic lesions in proximal jejunum which were treated with APC. Recommendations include NPO status and repeat upper endoscopy tomorrow for re-treatment as well as IV protonix, IV erythromycin and IV metoclopramide which have been ordered. Patient to remain in SDU for close monitoring. 2 units ordered by GI and will follow post-transfusion H&H; will transfuse Hb <7.   Einar Gip, DO Internal Medicine -PGY2 Pager [phone]

## 2017-09-30 NOTE — Transfer of Care (Signed)
Immediate Anesthesia Transfer of Care Note  Patient: Peggy House  Procedure(s) Performed: ESOPHAGOGASTRODUODENOSCOPY (EGD) WITH PROPOFOL (N/A ) HOT HEMOSTASIS (ARGON PLASMA COAGULATION/BICAP) (N/A )  Patient Location: PACU and Endoscopy Unit  Anesthesia Type:General  Level of Consciousness: sedated  Airway & Oxygen Therapy: Patient Spontanous Breathing and Patient connected to nasal cannula oxygen  Post-op Assessment: Report given to RN and Post -op Vital signs reviewed and stable  Post vital signs: Reviewed and stable  Last Vitals:  Vitals Value Taken Time  BP 121/57 09/30/2017  3:20 PM  Temp    Pulse 73 09/30/2017  3:26 PM  Resp 15 09/30/2017  3:26 PM  SpO2 100 % 09/30/2017  3:26 PM  Vitals shown include unvalidated device data.  Last Pain:  Vitals:   09/30/17 1357  TempSrc: Oral  PainSc: 0-No pain      Patients Stated Pain Goal: 0 (21/30/86 5784)  Complications: No apparent anesthesia complications

## 2017-09-30 NOTE — Op Note (Signed)
[facility] Patient Name: Peggy House Procedure Date : 09/30/2017 MRN: [id number] Attending MD: [name] , MD Date of Birth: 16-Aug-1960 CSN: [id number] Age: 57 Admit Type: Inpatient Procedure:                Upper GI endoscopy Indications:              Acute post hemorrhagic anemia, Melena Providers:                [name], MD, Cleda Daub, RN, Laurena Spies, Technician Referring MD:              Medicines:                Monitored Anesthesia Care Complications:            No immediate complications. Estimated Blood Loss:      Procedure:                Pre-Anesthesia Assessment:                           - Prior to the procedure, a History and Physical                            was performed, and patient medications and                            allergies were reviewed. The patient's tolerance of                            previous anesthesia was also reviewed. The risks                            and benefits of the procedure and the sedation                            options and risks were discussed with the patient.                            All questions were answered, and informed consent                            was obtained. Prior Anticoagulants: The patient has                            taken no previous anticoagulant or antiplatelet                            agents. ASA Grade Assessment: III - A patient with                            severe systemic disease. After reviewing the risks  and benefits, the patient was deemed in                            satisfactory condition to undergo the procedure.                           After obtaining informed consent, pediatric                            colonoscope was passed under direct vision.                            Throughout the procedure, the patient's blood                            pressure, pulse, and oxygen saturations were                    monitored continuously. The EC-[id number] ([id number])                            scope was introduced through the mouth, and                            advanced to the proximal jejunum. The upper GI                            endoscopy was accomplished without difficulty. The                            patient tolerated the procedure well. Scope In: Scope Out: Findings:      The examined esophagus was normal.      The Z-line was regular and was found 40 cm from the incisors.      A large clot was found in the cardia, in the gastric fundus and in the       gastric body. Underlying mucosa could not be evaluated. Patient vomitted       coffee ground material during the procedure and was electively intubated.      (Foreign body was found in the duodenal bulb)- previoulsy placed       endoclips were noted in the duodenal bulb, minimal oozing was noted from       this site and was treated with APC.      Two diminutive angiodysplastic lesions without bleeding were found in       the proximal jejunum. Coagulation for tissue destruction using argon       plasma at 1 liter/minute and 20 watts was successful.      An attempt was made to displace the large clot noted in proximal       stomach, but could not be removed due to its large size. Impression:               - Normal esophagus.                           - Z-line regular, 40 cm from the incisors.                           -  Clotted blood in the cardia, in the gastric                            fundus and in the gastric body.                           - Previously placed endoclips in duodenal bulb.                           - Two non-bleeding angiodysplastic lesions in the                            proximal jejunum. Treated with argon plasma                            coagulation (APC).                           - No specimens collected. Moderate Sedation:      Patient did not receive moderate sedation for this procedure, but        instead received monitored anesthesia care. Recommendation:           - NPO.                           - Use erythromycin 500 mg IV q 6 hr today.                           - Use metoclopramide (Reglan) 10 mg IV QID today.                           - Repeat upper endoscopy tomorrow for retreatment.                           - Give Protonix (pantoprazole): 8 mg/hr IV by                            continuous infusion. Procedure Code(s):        --- Professional ---                           [id number], Esophagogastroduodenoscopy, flexible,                            transoral; with ablation of tumor(s), polyp(s), or                            other lesion(s) (includes pre- and post-dilation                            and guide wire passage, when performed) Diagnosis Code(s):        --- Professional ---                           K92.2, Gastrointestinal hemorrhage, unspecified  T18.3XXS, Foreign body in small intestine, sequela                           K55.20, Angiodysplasia of colon without hemorrhage                           D62, Acute posthemorrhagic anemia                           K92.1, Melena (includes Hematochezia) CPT copyright 2017 American [employer] Association. All rights reserved. The codes documented in this report are preliminary and upon coder review may  be revised to meet current compliance requirements. [name], MD 09/30/2017 3:20:24 PM This report has been signed electronically. Number of Addenda: 0

## 2017-09-30 NOTE — Anesthesia Preprocedure Evaluation (Addendum)
Anesthesia Evaluation  Patient identified by MRN, date of birth, ID band Patient awake    Reviewed: Allergy & Precautions, NPO status , Patient's Chart, lab work & pertinent test results, reviewed documented beta blocker date and time   History of Anesthesia Complications Negative for: history of anesthetic complications  Airway Mallampati: III  TM Distance: >3 FB Neck ROM: Full    Dental  (+) Poor Dentition, Loose, Dental Advisory Given, Missing   Pulmonary COPD,  COPD inhaler, Current Smoker,    breath sounds clear to auscultation       Cardiovascular hypertension, Pt. on medications and Pt. on home beta blockers (-) angina Rhythm:Regular Rate:Normal  1/19 ECHO: EF 55-60%, mild AI, mild MR   Neuro/Psych Anxiety Depression negative neurological ROS     GI/Hepatic Neg liver ROS, GERD  Medicated and Controlled,Gastric AVM   Endo/Other  Morbid obesity  Renal/GU negative Renal ROS     Musculoskeletal  (+) Arthritis ,   Abdominal (+) + obese,   Peds  Hematology Hb 5.3, now s/p transfusion   Anesthesia Other Findings Pt has not had metoprolol while in hospital, is for endoscopy presently, will treat with IV B-blocker if needed  Reproductive/Obstetrics                            Anesthesia Physical Anesthesia Plan  ASA: III  Anesthesia Plan: MAC   Post-op Pain Management:    Induction:   PONV Risk Score and Plan: 1 and Treatment may vary due to age or medical condition  Airway Management Planned: Natural Airway and Nasal Cannula  Additional Equipment:   Intra-op Plan:   Post-operative Plan:   Informed Consent: I have reviewed the patients History and Physical, chart, labs and discussed the procedure including the risks, benefits and alternatives for the proposed anesthesia with the patient or authorized representative who has indicated his/her understanding and acceptance.   Dental  advisory given  Plan Discussed with: CRNA and Surgeon  Anesthesia Plan Comments: (Plan routine monitors, MAC)        Anesthesia Quick Evaluation

## 2017-09-30 NOTE — H&P (View-Only) (Signed)
Subjective:   HPI  The patient is a 57 year old female with a history of hereditary hemorrhagic telangiectasia. She was in this hospital earlier this month with a GI bleed and had an EGD showing a bleeding duodenal ulcer which required clipping and injection of epinephrine for control of bleeding. There were no obvious telangiectasias seen on this most recent EGD. She was discharged from the hospital and in reviewing her labs her last hemoglobin on June 13 was 8.4. She went to a doctor follow-up yesterday and was found to have a hemoglobin of 4.7 and was directly admitted. The patient reports to me that she has been having melena since discharge. Denies hematemesis or abdominal pain. She is currently receiving a blood transfusion. On her last admission this month she required 8 units of packed red cells and 2 units fresh frozen plasma and review of records. She had an EGD in January of this year and found to have angiodysplasia 2 of which were fulgurated with the argon plasma coagulator. She had a colonoscopy in 2016 and no vascular ectasia were seen on that examination.  Review of Systems No chest pain or shortness of breath  Past Medical History:  Diagnosis Date  . Anxiety   . Arthritis    knnes,back  . GERD (gastroesophageal reflux disease)   . History of swelling of feet   . Hyperlipidemia   . Hypertension   . Major depressive disorder, recurrent episode ([place]) 06/05/2015  . Obesity   . Snores    Past Surgical History:  Procedure Laterality Date  . ABDOMINAL HYSTERECTOMY    . CARPAL TUNNEL RELEASE  05/13/2011   Procedure: CARPAL TUNNEL RELEASE;  Surgeon: Nita Sells, MD;  Location: [place];  Service: Orthopedics;  Laterality: Left;  . COLONOSCOPY WITH PROPOFOL N/A 04/28/2014   Procedure: COLONOSCOPY WITH PROPOFOL;  Surgeon: Cleotis Nipper, MD;  Location: [facility] ENDOSCOPY;  Service: Endoscopy;  Laterality: N/A;  . DG TOES*L*  2/10   rt  . DILATION AND  CURETTAGE OF UTERUS    . ESOPHAGOGASTRODUODENOSCOPY N/A 04/10/2014   Procedure: ESOPHAGOGASTRODUODENOSCOPY (EGD);  Surgeon: [name], MD;  Location: [facility] ENDOSCOPY;  Service: Endoscopy;  Laterality: N/A;  . ESOPHAGOGASTRODUODENOSCOPY N/A 05/10/2017   Procedure: ESOPHAGOGASTRODUODENOSCOPY (EGD);  Surgeon: Ronald Lobo, MD;  Location: [facility] ENDOSCOPY;  Service: Endoscopy;  Laterality: N/A;  . ESOPHAGOGASTRODUODENOSCOPY N/A 09/22/2017   Procedure: ESOPHAGOGASTRODUODENOSCOPY (EGD);  Surgeon: Clarene Essex, MD;  Location: [place];  Service: Endoscopy;  Laterality: N/A;  bedside  . ESOPHAGOGASTRODUODENOSCOPY (EGD) WITH PROPOFOL N/A 04/27/2014   Procedure: ESOPHAGOGASTRODUODENOSCOPY (EGD) WITH PROPOFOL;  Surgeon: Cleotis Nipper, MD;  Location: [place];  Service: Endoscopy;  Laterality: N/A;  possible apc  . HOT HEMOSTASIS N/A 04/27/2014   Procedure: HOT HEMOSTASIS (ARGON PLASMA COAGULATION/BICAP);  Surgeon: Cleotis Nipper, MD;  Location[facility] ENDOSCOPY;  Service: Endoscopy;  Laterality: N/A;  . L shoulder Surgery  2011  . SUBMUCOSAL INJECTION  09/22/2017   Procedure: SUBMUCOSAL INJECTION;  Surgeon: Clarene Essex, MD;  Location: [facility] ENDOSCOPY;  Service: Endoscopy;;   Social History   Socioeconomic History  . Marital status: Single    Spouse name: Not on file  . Number of children: Not on file  . Years of education: Not on file  . Highest education level: Not on file  Occupational History  . Not on file  Social Needs  . Financial resource strain: Not on file  . Food insecurity:    Worry: Not on file  Inability: Not on file  . Transportation needs:    Medical: Not on file    Non-medical: Not on file  Tobacco Use  . Smoking status: Current Every Day Smoker    Packs/day: 0.50    Years: 30.00    Pack years: 15.00    Types: Cigarettes  . Smokeless tobacco: Former Systems developer    Types: Snuff    Quit date: 77  . Tobacco comment: 1/2 PPD  Substance and Sexual Activity  . Alcohol  use: No    Alcohol/week: 0.0 oz  . Drug use: No    Comment: last cocaine-2010  . Sexual activity: Yes    Birth control/protection: Surgical    Comment: Hysterectomy  Lifestyle  . Physical activity:    Days per week: Not on file    Minutes per session: Not on file  . Stress: Not on file  Relationships  . Social connections:    Talks on phone: Not on file    Gets together: Not on file    Attends religious service: Not on file    Active member of club or organization: Not on file    Attends meetings of clubs or organizations: Not on file    Relationship status: Not on file  . Intimate partner violence:    Fear of current or ex partner: Not on file    Emotionally abused: Not on file    Physically abused: Not on file    Forced sexual activity: Not on file  Other Topics Concern  . Not on file  Social History Narrative  . Not on file   family history includes Bleeding Disorder in her mother, sister, and son; Cancer in her mother; Colon cancer in her maternal grandfather; Crohn's disease in her maternal grandfather; Diabetes in her mother and sister; Diabetes type II in her sister; Kidney disease in her mother and son; Ovarian cancer in her mother; Stomach cancer in her maternal grandmother.  Current Facility-Administered Medications:  .  0.9 %  sodium chloride infusion, , Intravenous, Once, Zada Finders, MD .  famotidine (PEPCID) IVPB 20 mg premix, 20 mg, Intravenous, Q12H, Ledell Noss, MD, Stopped at 09/29/17 2115 .  lactated ringers infusion, , Intravenous, Continuous, Ledell Noss, MD, Stopped at 09/30/17 0531 .  sodium chloride flush (NS) 0.9 % injection 3 mL, 3 mL, Intravenous, Q12H, Zada Finders, MD, 3 mL at 09/29/17 2035 Allergies  Allergen Reactions  . Nsaids Other (See Comments)    Stomach bleeding episodes; Tylenol is OK  . Tomato Hives     Objective:     BP (!) 117/50   Pulse 69   Temp 98.5 F (36.9 C) (Oral)   Resp 18   Ht 5\' 5"  (1.651 m)   Wt 114.4 kg (252 lb  1.6 oz)   SpO2 99%   BMI 41.95 kg/m   No acute distress  Alert and oriented  Heart regular rhythm no murmurs  Lungs clear  Abdomen: Bowel sounds present, soft, nontender, no hepatosplenomegaly  Laboratory No components found for: D1    Assessment:     Blood loss anemia  Recent bleeding duodenal ulcer  Hereditary hemorrhagic telangiectasia        Plan:     The patient has continued to have melena since discharge. I think it would be important to determine if the cratered previously bleeding duodenal ulcer is still causing a problem of bleeding. She is currently receiving blood transfusion. We will proceed with EGD this afternoon to determine if  the ulcer is present or not or is continuing to bleed. Another possible reason for bleeding of course would be further telangiectasia scattered throughout her intestinal tract. She had a colonoscopy in 2016 and no vascular ectasia were seen on that examination.

## 2017-09-30 NOTE — Brief Op Note (Signed)
09/29/2017 - 09/30/2017  3:20 PM  PATIENT:  Peggy House  57 y.o. female  PRE-OPERATIVE DIAGNOSIS:  melena, anemia  POST-OPERATIVE DIAGNOSIS:  duodenal avms/   PROCEDURE:  Procedure(s): ESOPHAGOGASTRODUODENOSCOPY (EGD) WITH PROPOFOL (N/A) HOT HEMOSTASIS (ARGON PLASMA COAGULATION/BICAP) (N/A)  SURGEON:  Surgeon(s) and Role:    [name], MD - Primary  PHYSICIAN ASSISTANT:   ASSISTANTS: Cleda Daub, RN, Laurena Spies, Tech ANESTHESIA:   MAC  EBL:  Minimal  BLOOD ADMINISTERED:none  DRAINS: none   LOCAL MEDICATIONS USED:  NONE  SPECIMEN:  No Specimen  DISPOSITION OF SPECIMEN:  N/A  COUNTS:  YES  TOURNIQUET:  * No tourniquets in log *  DICTATION: .Dragon Dictation  PLAN OF CARE: Admit to inpatient   PATIENT DISPOSITION:  PACU - hemodynamically stable.   Delay start of Pharmacological VTE agent (>24hrs) due to surgical blood loss or risk of bleeding: yes

## 2017-10-01 ENCOUNTER — Inpatient Hospital Stay (HOSPITAL_COMMUNITY): Payer: Medicaid Other | Admitting: Certified Registered Nurse Anesthetist

## 2017-10-01 ENCOUNTER — Encounter (HOSPITAL_COMMUNITY): Payer: Self-pay | Admitting: Certified Registered Nurse Anesthetist

## 2017-10-01 ENCOUNTER — Encounter (HOSPITAL_COMMUNITY)
Admission: AD | Disposition: A | Discharge status: Home / Self Care | Payer: Self-pay | Source: Ambulatory Visit | Attending: Student in an Organized Health Care Education/Training Program

## 2017-10-01 DIAGNOSIS — K5521 Angiodysplasia of colon with hemorrhage: Secondary | ICD-10-CM

## 2017-10-01 HISTORY — PX: ESOPHAGOGASTRODUODENOSCOPY (EGD) WITH PROPOFOL: SHX5813

## 2017-10-01 HISTORY — PX: HOT HEMOSTASIS: SHX5433

## 2017-10-01 LAB — BASIC METABOLIC PANEL
Anion gap: 4 — ABNORMAL LOW (ref 5–15)
BUN: 22 mg/dL — AB (ref 6–20)
CALCIUM: 8 mg/dL — AB (ref 8.9–10.3)
CO2: 23 mmol/L (ref 22–32)
CREATININE: 0.9 mg/dL (ref 0.44–1.00)
Chloride: 120 mmol/L — ABNORMAL HIGH (ref 101–111)
GFR calc non Af Amer: >60 mL/min (ref >60)
GLUCOSE: 98 mg/dL (ref 65–99)
Potassium: 3.9 mmol/L (ref 3.5–5.1)
Sodium: 147 mmol/L — ABNORMAL HIGH (ref 135–145)

## 2017-10-01 LAB — CBC
HEMATOCRIT: 24.9 % — AB (ref 36.0–46.0)
Hemoglobin: 7.8 g/dL — ABNORMAL LOW (ref 12.0–15.0)
MCH: 30.2 pg (ref 26.0–34.0)
MCHC: 31.3 g/dL (ref 30.0–36.0)
MCV: 96.5 fL (ref 78.0–100.0)
Platelets: 212 10*3/uL (ref 150–400)
RBC: 2.58 MIL/uL — ABNORMAL LOW (ref 3.87–5.11)
RDW: 18.6 % — AB (ref 11.5–15.5)
WBC: 18.1 10*3/uL — ABNORMAL HIGH (ref 4.0–10.5)

## 2017-10-01 LAB — HEMOGLOBIN AND HEMATOCRIT, BLOOD
HCT: 26.1 % — ABNORMAL LOW (ref 36.0–46.0)
Hemoglobin: 8.2 g/dL — ABNORMAL LOW (ref 12.0–15.0)

## 2017-10-01 SURGERY — ESOPHAGOGASTRODUODENOSCOPY (EGD) WITH PROPOFOL
Anesthesia: Monitor Anesthesia Care

## 2017-10-01 MED ORDER — TRANEXAMIC ACID 650 MG PO TABS
1300.0000 mg | ORAL_TABLET | Freq: Two times a day (BID) | ORAL | Status: DC
  Administered 2017-10-01 – 2017-10-03 (×4): 1300 mg via ORAL
  Filled 2017-10-01 (×5): qty 2

## 2017-10-01 MED ORDER — SODIUM CHLORIDE 0.9 % IV SOLN
INTRAVENOUS | Status: DC
  Administered 2017-10-01 – 2017-10-02 (×3): via INTRAVENOUS

## 2017-10-01 MED ORDER — LACTATED RINGERS IV SOLN
INTRAVENOUS | Status: DC
  Administered 2017-10-01: 1000 mL via INTRAVENOUS

## 2017-10-01 MED ORDER — TRANEXAMIC ACID 650 MG PO TABS
1300.0000 mg | ORAL_TABLET | Freq: Two times a day (BID) | ORAL | Status: DC
  Filled 2017-10-01: qty 2

## 2017-10-01 MED ORDER — PROPOFOL 10 MG/ML IV BOLUS
INTRAVENOUS | Status: DC | PRN
  Administered 2017-10-01 (×5): 20 mg via INTRAVENOUS
  Administered 2017-10-01: 40 mg via INTRAVENOUS
  Administered 2017-10-01: 20 mg via INTRAVENOUS
  Administered 2017-10-01: 60 mg via INTRAVENOUS

## 2017-10-01 MED ORDER — PANTOPRAZOLE SODIUM 40 MG PO TBEC
40.0000 mg | DELAYED_RELEASE_TABLET | Freq: Every day | ORAL | Status: DC
  Administered 2017-10-02 – 2017-10-03 (×2): 40 mg via ORAL
  Filled 2017-10-01 (×2): qty 1

## 2017-10-01 MED ORDER — LIDOCAINE 2% (20 MG/ML) 5 ML SYRINGE
INTRAMUSCULAR | Status: DC | PRN
  Administered 2017-10-01: 60 mg via INTRAVENOUS

## 2017-10-01 MED ORDER — PANTOPRAZOLE SODIUM 40 MG IV SOLR
40.0000 mg | INTRAVENOUS | Status: DC
  Administered 2017-10-01: 40 mg via INTRAVENOUS
  Filled 2017-10-01 (×2): qty 40

## 2017-10-01 SURGICAL SUPPLY — 15 items

## 2017-10-01 NOTE — H&P (Signed)
56/female with melena and anemia, had an EGD yesterday which showed a large blood clot in cardia, fundus and proximal gastric body. Duodenal and jejunal AVMs were treated with APC. She is scheduled for a repeat EGD after receiving reglan (in an attempt to wash the clot).  [name], MD

## 2017-10-01 NOTE — Brief Op Note (Signed)
09/29/2017 - 10/01/2017  8:44 AM  PATIENT:  Peggy House  57 y.o. female  PRE-OPERATIVE DIAGNOSIS:  blood in stomach  POST-OPERATIVE DIAGNOSIS:  multiple gastric avm  PROCEDURE:  Procedure(s): ESOPHAGOGASTRODUODENOSCOPY (EGD) WITH PROPOFOL (N/A) HOT HEMOSTASIS (ARGON PLASMA COAGULATION/BICAP) (N/A)  SURGEON:  Surgeon(s) and Role:    [name], MD - Primary  PHYSICIAN ASSISTANT:   ASSISTANTS: Cleda Daub, RN, Elspeth Cho, Tech ANESTHESIA:   MAC  EBL:  Minimal  BLOOD ADMINISTERED:none  DRAINS: none   LOCAL MEDICATIONS USED:  NONE  SPECIMEN:  No Specimen  DISPOSITION OF SPECIMEN:  N/A  COUNTS:  YES  TOURNIQUET:  * No tourniquets in log *  DICTATION: .Dragon Dictation  PLAN OF CARE: Admit to inpatient   PATIENT DISPOSITION:  PACU - hemodynamically stable.   Delay start of Pharmacological VTE agent (>24hrs) due to surgical blood loss or risk of bleeding: no

## 2017-10-01 NOTE — Progress Notes (Signed)
   Subjective: She feels well this morning. She denies any dyspnea, chest pain, light headedness, dizziness, or abdominal pain. She had two melanotic stools overnight.  Overnight events EGD yesterday (6/18) revealed only clotted blood in the fundus, cardia, and gastric body. The stomach was incompletely visualized due to the volume of blood. Multiple AVM's were noted in the proximal jejunum, which were treated with argon plasma coagulation. Due to hematemesis, she was electively intubated. She has subsequently been extubated. She was treated with erythromycin and Reglan overnight to improve motility, and EGD was repeated today (6/19). EGD today revealed a single non-bleeding ulcer and multiple non-bleeding telangiectasias.  Objective: Vital signs in last 24 hours: Vitals:   10/01/17 0900 10/01/17 0910 10/01/17 0920 10/01/17 1036  BP: (!) 87/36 (!) 103/33 (!) 106/48 (!) 101/59  Pulse: (!) 58 (!) 58 (!) 58 65  Resp: (!) 25 19 18    Temp:      TempSrc:      SpO2: 100% 99% 100%   Weight:      Height:       Weight change: 0 kg (0 lb)  Intake/Output Summary (Last 24 hours) at 10/01/2017 1116 Last data filed at 10/01/2017 0841 Gross per 24 hour  Intake 3110.97 ml  Output 810 ml  Net 2300.97 ml   GENERAL: Well-appearing woman sitting on edge of bed. Appears older than stated age. NEURO: Mental status not formally assessed but grossly intact. Strength and coordination grossly intact. CARDIOVASCULAR: Regular rate and rhythm. 2/6 blowing crescendo-decrescendo systolic murmur heard throughout the precordium but loudest at the left second intercostal space. PULM: Normal work of breathing. Lungs clear to auscultation bilaterally with no wheezes, rales, or ronchi ABDOMINAL: Non-distended. Non-tender. Normoactive bowel sounds. EXTREMITIES: 2+ radial and 1+ dorsalis pedis pulses. Extremities are warm and well-perfused SKIN: Small telangiectasias over the right palm, no rashes on the exposed skin of  the arms, chest, or legs  Assessment/Plan:  Principal Problem:   Upper GI bleed Active Problems:   Acute blood loss anemia   HLD (hyperlipidemia)   Essential hypertension   Major depressive disorder, recurrent episode (H[facility])   Hereditary hemorrhagic telangiectasia ([facility])  GI bleed Hemoglobin rose from 5.8 to 7.8 with almost 4 units of pRBC's, suggesting ongoing bleeding. She continues to have melena. She does not have an actively bleeding ulcer visualized on EGD. She likely has numerous AVM's scattered throughout her GI tract that are bleeding intermittently. She has frequent and severe epistaxis as well, both at home and witnessed while inpatient. At this point she will likely require long-term systemic therapy in order to prevent frequent readmissions. - Appreciate excellent assistance and recommendations from GI - Consult to hematology regarding systemic therapy (tamoxifen or tranexamic acid) - Follow up post-procedure H/H - Resume regular diet - Resume home iron supplementation  Hypernatremia Likely secondary to poor PO intake due to frequent NPO status over the past two weeks and recent ICU admission. - Normal saline 100 mL/hr  DVT ppx: SCD's GI ppx: Protonix Diet: Regular Dispo: Stepdown level. Possible discharge 6/20 Code status:Full code   LOS: 2 days   Doristine Johns, Medical Student 10/01/2017, 11:16 AM

## 2017-10-01 NOTE — Op Note (Signed)
EGD performed for posthemorrhagic anemia, melena and large blood clot noted in cardia/fundus/proximal gastric body on EGD yesterday. Findings:  Normal-appearing esophagus, irregular Z line at 38 cm from incisors. Multiple 3-5 mm non bleeding angiectasia is found in cardia, gastric fundus and gastric body. Coagulation for tissue destruction using argon plasma at 1 L/m and 20 W was successful. Normal-appearing gastric antrum. Previously noted non bleeding superficial ulcer with a clean base found in duodenal bulb with 2 intact endoclips. Rest of the duodenum appeared unremarkable.   Post procedure patient had an episode of epistaxis.  Recommendation: Regular diet. Monitor H&H, okay to DC from GI standpoint if hemoglobin remains stable. Avoid antiplatelets and NSAIDs. PPI once daily indefinitely.   [name], and

## 2017-10-01 NOTE — Op Note (Signed)
[facility] Patient Name: Peggy House Procedure Date : 10/01/2017 MRN: [id number] Attending MD: [name] , MD Date of Birth: 1960/04/27 CSN: [id number] Age: 57 Admit Type: Inpatient Procedure:                Upper GI endoscopy Indications:              Acute post hemorrhagic anemia, Melena, large blood                            clot noted in cardia/fundus/proximal gastric body                            on EGD yesterday Providers:                [name], MD, Cleda Daub, RN, Elspeth Cho                            Tech., Technician Referring MD:              Medicines:                Monitored Anesthesia Care Complications:            No immediate complications. Estimated blood loss:                            None. Estimated Blood Loss:     Estimated blood loss: none. Procedure:                Pre-Anesthesia Assessment:                           - Prior to the procedure, a History and Physical                            was performed, and patient medications and                            allergies were reviewed. The patient's tolerance of                            previous anesthesia was also reviewed. The risks                            and benefits of the procedure and the sedation                            options and risks were discussed with the patient.                            All questions were answered, and informed consent                            was obtained. Prior Anticoagulants: The patient has                            taken no previous  anticoagulant or antiplatelet                            agents. ASA Grade Assessment: III - A patient with                            severe systemic disease. After reviewing the risks                            and benefits, the patient was deemed in                            satisfactory condition to undergo the procedure.                           After obtaining informed consent, the endoscope  was                            passed under direct vision. Throughout the                            procedure, the patient's blood pressure, pulse, and                            oxygen saturations were monitored continuously. The                            [id number]) scope was introduced through                            the mouth, and advanced to the second part of                            duodenum. The upper GI endoscopy was accomplished                            without difficulty. The patient tolerated the                            procedure well. Scope In: Scope Out: Findings:      The examined esophagus was normal.      The Z-line was regular and was found 38 cm from the incisors.      Multiple 3 to 5 mm no bleeding angioectasias were found in the cardia,       in the gastric fundus and in the gastric body. Coagulation for tissue       destruction using argon plasma at 1 liter/minute and 20 watts was       successful.      The gastric antrum was normal.      One non-bleeding superficial duodenal ulcer with a clean ulcer base       (Forrest Class III) was found in the duodenal bulb. The lesion was 8 mm       in largest dimension. Previously placed endoclips appeared intact.      The first portion of the  duodenum and second portion of the duodenum       were normal. Impression:               - Normal esophagus.                           - Z-line regular, 38 cm from the incisors.                           - Multiple non-bleeding angioectasias in the                            stomach. Treated with argon plasma coagulation                            (APC).                           - Normal antrum.                           - One non-bleeding duodenal ulcer with a clean                            ulcer base (Forrest Class III), also noted on prior                            EGD.                           - Normal first portion of the duodenum and second                             portion of the duodenum.                           - No specimens collected. Moderate Sedation:      Patient did not receive moderate sedation for this procedure, but       instead received monitored anesthesia care. Recommendation:           - Resume regular diet.                           - Continue present medications.                           - Patient noted to have epistaxis post procedure. Procedure Code(s):        --- Professional ---                           [phone], Esophagogastroduodenoscopy, flexible,                            transoral; with ablation of tumor(s), polyp(s), or                            other lesion(s) (includes pre- and post-dilation  and guide wire passage, when performed) Diagnosis Code(s):        --- Professional ---                           K31.819, Angiodysplasia of stomach and duodenum                            without bleeding                           K26.9, Duodenal ulcer, unspecified as acute or                            chronic, without hemorrhage or perforation                           D62, Acute posthemorrhagic anemia                           K92.1, Melena (includes Hematochezia) CPT copyright 2017 American [employer] Association. All rights reserved. The codes documented in this report are preliminary and upon coder review may  be revised to meet current compliance requirements. [name], MD 10/01/2017 8:43:36 AM This report has been signed electronically. Number of Addenda: 0

## 2017-10-01 NOTE — Anesthesia Postprocedure Evaluation (Signed)
Anesthesia Post Note  Patient: Peggy House  Procedure(s) Performed: ESOPHAGOGASTRODUODENOSCOPY (EGD) WITH PROPOFOL (N/A ) HOT HEMOSTASIS (ARGON PLASMA COAGULATION/BICAP) (N/A )     Patient location during evaluation: Endoscopy Anesthesia Type: MAC Level of consciousness: awake and alert Pain management: pain level controlled Vital Signs Assessment: post-procedure vital signs reviewed and stable Respiratory status: spontaneous breathing, nonlabored ventilation, respiratory function stable and patient connected to nasal cannula oxygen Cardiovascular status: blood pressure returned to baseline and stable Postop Assessment: no apparent nausea or vomiting Anesthetic complications: no    Last Vitals:  Vitals:   10/01/17 0920 10/01/17 1036  BP: (!) 106/48 (!) 101/59  Pulse: (!) 58 65  Resp: 18   Temp:    SpO2: 100%     Last Pain:  Vitals:   10/01/17 1046  TempSrc:   PainSc: 0-No pain                 Dajai Wahlert

## 2017-10-01 NOTE — Transfer of Care (Signed)
Immediate Anesthesia Transfer of Care Note  Patient: Peggy House  Procedure(s) Performed: ESOPHAGOGASTRODUODENOSCOPY (EGD) WITH PROPOFOL (N/A ) HOT HEMOSTASIS (ARGON PLASMA COAGULATION/BICAP) (N/A )  Patient Location: PACU and Endoscopy Unit  Anesthesia Type:MAC  Level of Consciousness: awake, alert  and oriented  Airway & Oxygen Therapy: Patient Spontanous Breathing and Patient connected to nasal cannula oxygen  Post-op Assessment: Report given to RN and Post -op Vital signs reviewed and stable  Post vital signs: Reviewed and stable  Last Vitals:  Vitals Value Taken Time  BP 95/46 10/01/2017  8:44 AM  Temp    Pulse 58 10/01/2017  8:52 AM  Resp 23 10/01/2017  8:52 AM  SpO2 100 % 10/01/2017  8:52 AM  Vitals shown include unvalidated device data.  Last Pain:  Vitals:   10/01/17 0844  TempSrc: Oral  PainSc:       Patients Stated Pain Goal: 0 (39/76/73 4193)  Complications: No apparent anesthesia complications

## 2017-10-01 NOTE — Anesthesia Preprocedure Evaluation (Signed)
Anesthesia Evaluation  Patient identified by MRN, date of birth, ID band Patient awake    Reviewed: Allergy & Precautions, NPO status , Patient's Chart, lab work & pertinent test results, reviewed documented beta blocker date and time   Airway Mallampati: III  TM Distance: >3 FB Neck ROM: Full    Dental  (+) Poor Dentition, Missing, Chipped   Pulmonary neg shortness of breath, neg sleep apnea, neg COPD, neg recent URI, Current Smoker,    breath sounds clear to auscultation       Cardiovascular hypertension, Pt. on medications and Pt. on home beta blockers  Rhythm:Regular     Neuro/Psych PSYCHIATRIC DISORDERS Anxiety Depression negative neurological ROS     GI/Hepatic GERD  Medicated,  Endo/Other  Morbid obesity  Renal/GU      Musculoskeletal  (+) Arthritis ,   Abdominal   Peds  Hematology  (+) anemia ,   Anesthesia Other Findings   Reproductive/Obstetrics                             Anesthesia Physical Anesthesia Plan  ASA: III  Anesthesia Plan: MAC   Post-op Pain Management:    Induction:   PONV Risk Score and Plan: 1  Airway Management Planned: Nasal Cannula  Additional Equipment:   Intra-op Plan:   Post-operative Plan:   Informed Consent: I have reviewed the patients History and Physical, chart, labs and discussed the procedure including the risks, benefits and alternatives for the proposed anesthesia with the patient or authorized representative who has indicated his/her understanding and acceptance.   Dental advisory given  Plan Discussed with: CRNA and Surgeon  Anesthesia Plan Comments:         Anesthesia Quick Evaluation

## 2017-10-02 ENCOUNTER — Encounter (HOSPITAL_COMMUNITY)
Admission: AD | Disposition: A | Discharge status: Home / Self Care | Payer: Self-pay | Source: Ambulatory Visit | Attending: Student in an Organized Health Care Education/Training Program

## 2017-10-02 ENCOUNTER — Encounter (HOSPITAL_COMMUNITY): Payer: Self-pay | Admitting: *Deleted

## 2017-10-02 DIAGNOSIS — D62 Acute posthemorrhagic anemia: Secondary | ICD-10-CM

## 2017-10-02 HISTORY — PX: GIVENS CAPSULE STUDY: SHX5432

## 2017-10-02 LAB — CBC
HCT: 22.4 % — ABNORMAL LOW (ref 36.0–46.0)
Hemoglobin: 6.9 g/dL — CL (ref 12.0–15.0)
MCH: 30.5 pg (ref 26.0–34.0)
MCHC: 30.8 g/dL (ref 30.0–36.0)
MCV: 99.1 fL (ref 78.0–100.0)
PLATELETS: 235 10*3/uL (ref 150–400)
RBC: 2.26 MIL/uL — ABNORMAL LOW (ref 3.87–5.11)
RDW: 20.8 % — AB (ref 11.5–15.5)
WBC: 16.1 10*3/uL — ABNORMAL HIGH (ref 4.0–10.5)

## 2017-10-02 LAB — PREPARE RBC (CROSSMATCH)

## 2017-10-02 LAB — HEMOGLOBIN AND HEMATOCRIT, BLOOD
HCT: 24.6 % — ABNORMAL LOW (ref 36.0–46.0)
HEMOGLOBIN: 7.7 g/dL — AB (ref 12.0–15.0)

## 2017-10-02 SURGERY — IMAGING PROCEDURE, GI TRACT, INTRALUMINAL, VIA CAPSULE

## 2017-10-02 MED ORDER — RAMELTEON 8 MG PO TABS
8.0000 mg | ORAL_TABLET | Freq: Every day | ORAL | Status: DC
  Administered 2017-10-02 (×2): 8 mg via ORAL
  Filled 2017-10-02 (×3): qty 1

## 2017-10-02 MED ORDER — SODIUM CHLORIDE 0.9% IV SOLUTION: Freq: Once | INTRAVENOUS | Status: DC

## 2017-10-02 MED ORDER — SODIUM CHLORIDE 0.9 % IV SOLN: INTRAVENOUS | Status: DC

## 2017-10-02 MED ORDER — ONDANSETRON HCL 4 MG/2ML IJ SOLN
4.0000 mg | Freq: Four times a day (QID) | INTRAMUSCULAR | Status: DC | PRN
  Administered 2017-10-02: 4 mg via INTRAVENOUS
  Filled 2017-10-02 (×2): qty 2

## 2017-10-02 NOTE — Progress Notes (Addendum)
   Subjective: Feels well this morning. 3 more episodes of melena overnight. She expresses interest in going to the [facility] in [facility], but does not have reliable transportation, and her son drives her to appointments.  Objective: Vital signs in last 24 hours: Vitals:   10/02/17 0405 10/02/17 0807 10/02/17 0840 10/02/17 1114  BP: (!) 109/52 (!) 96/57 121/63 128/73  Pulse: 61 (!) 57 (!) 53 (!) 53  Resp: 18  18   Temp: 98.2 F (36.8 C) 98.3 F (36.8 C) 98.3 F (36.8 C) 97.9 F (36.6 C)  TempSrc:   Oral Oral  SpO2: 99% 98% 100% 100%  Weight: 116.7 kg (257 lb 4.8 oz)     Height:       Weight change: -0.454 kg (-1 lb)  Intake/Output Summary (Last 24 hours) at 10/02/2017 1144 Last data filed at 10/02/2017 0446 Gross per 24 hour  Intake 2528.29 ml  Output 250 ml  Net 2278.29 ml   GENERAL: Well-appearing woman lying comfortably in bed. Appears older than stated age. NEURO: Mental status not formally assessed but grossly intact. Strength and coordination grossly intact. CARDIOVASCULAR: Regular rate and rhythm. 2/6 blowing crescendo-decrescendo systolic murmur heard throughout the precordium but loudest at the left second intercostal space. Murmur obscures S1. S2 normal. PULM: Normal work of breathing. Lungs clear to auscultation bilaterally with no wheezes, rales, or ronchi ABDOMINAL: Non-distended. Non-tender. Normoactive bowel sounds. 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 is a 57 year old woman with a history of HHT, who presented with a recurrent GI bleed. She remains admitted for persistent melena  and anemia.  Anemia secondary to GI bleed Hemoglobin fell from 8.2 to 6.9 overnight, when she had 3 more episodes of melena. At this point, she has had consistent and persistent melena for several weeks. She is currently averaging more than one blood transfusion per day just to keep up with ongoing GI blood loss. The team is hopeful that tranexamic acid will help stabilize her ongoing loss. GI is now planning pill endoscopy to evaluate for AVM's that are too distal to visualize with an endoscope. - Appreciate excellent assistance and recommendations from Dr. Therisa Doyne (GI) - Transfuse 1 unit pRBC's - Follow up post-transfusion H/H. Transfuse if <7 - Capsule endoscopy 6/21 per GI - NPO at midnight - Tranexamic acid 1300 mg BID  DVT ppx: SCD's GI ppx: Protonix Diet: Regular Dispo:Stepdown level. Possible discharge 6/21 pending results of capsule endoscopy Code status:Full code   LOS: 3 days   Doristine Johns, Medical Student 10/02/2017, 11:44 AM    Attestation for Student Documentation:  I personally was present and performed or re-performed the history, physical exam and medical decision-making activities of this service and have verified that the service and findings are accurately documented in the student's note.  Velna Ochs, M.D. - PGY2 10/02/2017, 1:38 PM

## 2017-10-02 NOTE — Progress Notes (Signed)
Subjective: She was seen at bedside, she reports 3 dark and black stools overnight.  Objective: Vital signs in last 24 hours: Temp:  [98.2 F (36.8 C)-98.7 F (37.1 C)] 98.3 F (36.8 C) (06/20 0840) Pulse Rate:  [53-62] 53 (06/20 0840) Resp:  [16-18] 18 (06/20 0840) BP: (96-141)/(52-73) 121/63 (06/20 0840) SpO2:  [98 %-100 %] 100 % (06/20 0840) Weight:  [116.7 kg (257 lb 4.8 oz)] 116.7 kg (257 lb 4.8 oz) (06/20 0405) Weight change: -0.454 kg (-1 lb) Last BM Date: 10/01/17  PE:not in distress GENERAL:prominent pallor ABDOMEN:non-distended EXTREMITIES: no deformity  Lab Results: Results for orders placed or performed during the hospital encounter of 09/29/17 (from the past 48 hour(s))  I-STAT 4, (NA,K, GLUC, HGB,HCT)     Status: Abnormal   Collection Time: 09/30/17  3:11 PM  Result Value Ref Range   Sodium 146 (H) 135 - 145 mmol/L   Potassium 4.3 3.5 - 5.1 mmol/L   Glucose, Bld 108 (H) 65 - 99 mg/dL   HCT 17.0 (L) 36.0 - 46.0 %   Hemoglobin 5.8 (LL) 12.0 - 15.0 g/dL   Comment NOTIFIED PHYSICIAN   Prepare RBC     Status: None   Collection Time: 09/30/17  4:26 PM  Result Value Ref Range   Order Confirmation      ORDER PROCESSED BY BLOOD BANK Performed at [facility], [address], [place], [place] [address]   Hemoglobin and hematocrit, blood     Status: Abnormal   Collection Time: 09/30/17  4:29 PM  Result Value Ref Range   Hemoglobin 6.4 (LL) 12.0 - 15.0 g/dL    Comment: REPEATED TO VERIFY SPECIMEN CHECKED FOR CLOTS CRITICAL VALUE NOTED.  VALUE IS CONSISTENT WITH PREVIOUSLY REPORTED AND CALLED VALUE.    HCT 20.7 (L) 36.0 - 46.0 %    Comment: Performed at [facility], [place] [address] [place], [place] [address]  CBC     Status: Abnormal   Collection Time: 10/01/17  4:15 AM  Result Value Ref Range   WBC 18.1 (H) 4.0 - 10.5 K/uL   RBC 2.58 (L) 3.87 - 5.11 MIL/uL   Hemoglobin 7.8 (L) 12.0 - 15.0 g/dL   HCT 24.9 (L) 36.0 - 46.0 %   MCV 96.5 78.0 - 100.0  fL   MCH 30.2 26.0 - 34.0 pg   MCHC 31.3 30.0 - 36.0 g/dL   RDW 18.6 (H) 11.5 - 15.5 %   Platelets 212 150 - 400 K/uL    Comment: Performed at [facility], [place] [address] [place],  [address]  Basic metabolic panel     Status: Abnormal   Collection Time: 10/01/17  4:15 AM  Result Value Ref Range   Sodium 147 (H) 135 - 145 mmol/L   Potassium 3.9 3.5 - 5.1 mmol/L   Chloride 120 (H) 101 - 111 mmol/L   CO2 23 22 - 32 mmol/L   Glucose, Bld 98 65 - 99 mg/dL   BUN 22 (H) 6 - 20 mg/dL   Creatinine, Ser 0.90 0.44 - 1.00 mg/dL   Calcium 8.0 (L) 8.9 - 10.3 mg/dL   GFR calc non Af Amer >60 >60 mL/min   GFR calc Af Amer >60 >60 mL/min    Comment: (NOTE) The eGFR has been calculated using the CKD EPI equation. This calculation has not been validated in all clinical situations. eGFR's persistently <60 mL/min signify possible Chronic Kidney Disease.    Anion gap 4 (L) 5 -  15    Comment: Performed at [facility], [place] [address], [place], [place] [address]  Hemoglobin and hematocrit, blood     Status: Abnormal   Collection Time: 10/01/17  3:42 PM  Result Value Ref Range   Hemoglobin 8.2 (L) 12.0 - 15.0 g/dL   HCT 26.1 (L) 36.0 - 46.0 %    Comment: Performed at [facility], [place] [address], [place], [place] [address]  CBC     Status: Abnormal   Collection Time: 10/02/17  5:59 AM  Result Value Ref Range   WBC 16.1 (H) 4.0 - 10.5 K/uL    Comment: REPEATED TO VERIFY   RBC 2.26 (L) 3.87 - 5.11 MIL/uL   Hemoglobin 6.9 (LL) 12.0 - 15.0 g/dL    Comment: REPEATED TO VERIFY CRITICAL RESULT CALLED TO, READ BACK BY AND VERIFIED WITH: Lilli Light, RN [phone] 10/02/2017 BY MACEDA,J.    HCT 22.4 (L) 36.0 - 46.0 %   MCV 99.1 78.0 - 100.0 fL   MCH 30.5 26.0 - 34.0 pg   MCHC 30.8 30.0 - 36.0 g/dL   RDW 20.8 (H) 11.5 - 15.5 %   Platelets 235 150 - 400 K/uL    Comment: Performed at [facility], [place] [address], [place], [place] [address]  Prepare RBC     Status: None    Collection Time: 10/02/17  7:49 AM  Result Value Ref Range   Order Confirmation      ORDER PROCESSED BY BLOOD BANK Performed at [facility], [place] [address], [place],  [address]     Studies/Results: No results found.  Medications: I have reviewed the patient's current medications.  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 2. Hemoglobin dropped again from 8.2 to 6.9, 3 dark/black stools overnight?ongoing bleed versus passage of old blood  Plan: Capsule endoscopy today, possible AVMs in small bowel. Monitor H&H and transfuse to keep hemoglobin above 7.   [name] 10/02/2017, 10:39 AM   Pager [phone] If no answer or after 5 PM call [phone]

## 2017-10-02 NOTE — Progress Notes (Signed)
Patient felt nauseated and wanted something for nausea.  I asked MD for an order for Zofran, ordered was placed (verbal order from MD).  Also patient wanted a sleeping medication, which I also asked the provider to put it an ordered for this type of medication (also took verbal order).  I place orders and wait for medication.

## 2017-10-02 NOTE — Progress Notes (Signed)
Patient swallowed capsule at 15:00,procedure end 03:00.

## 2017-10-02 NOTE — Progress Notes (Signed)
Critical Result - Hgb = 6.9. Dr. Velna Ochs aware. Orders received.

## 2017-10-02 NOTE — Progress Notes (Signed)
Patient felt better, did not wanted Zofran for nausea any more.  She did request for the sleeping medication.  Provided medication for her.  I will keep monitoring the patient.

## 2017-10-03 ENCOUNTER — Inpatient Hospital Stay (HOSPITAL_COMMUNITY): Payer: Medicaid Other

## 2017-10-03 DIAGNOSIS — E870 Hyperosmolality and hypernatremia: Secondary | ICD-10-CM

## 2017-10-03 LAB — TYPE AND SCREEN
ABO/RH(D): A NEG
Antibody Screen: NEGATIVE
UNIT DIVISION: 0
UNIT DIVISION: 0
Unit division: 0
Unit division: 0
Unit division: 0
Unit division: 0
Unit division: 0

## 2017-10-03 LAB — CBC
HCT: 24.8 % — ABNORMAL LOW (ref 36.0–46.0)
Hemoglobin: 7.6 g/dL — ABNORMAL LOW (ref 12.0–15.0)
MCH: 30.3 pg (ref 26.0–34.0)
MCHC: 30.6 g/dL (ref 30.0–36.0)
MCV: 98.8 fL (ref 78.0–100.0)
Platelets: 265 10*3/uL (ref 150–400)
RBC: 2.51 MIL/uL — AB (ref 3.87–5.11)
RDW: 20.5 % — ABNORMAL HIGH (ref 11.5–15.5)
WBC: 13.1 10*3/uL — ABNORMAL HIGH (ref 4.0–10.5)

## 2017-10-03 LAB — BPAM RBC
BLOOD PRODUCT EXPIRATION DATE: 201906302359
BLOOD PRODUCT EXPIRATION DATE: 201907032359
Blood Product Expiration Date: 201906292359
Blood Product Expiration Date: 201906302359
Blood Product Expiration Date: 201906302359
Blood Product Expiration Date: 201906302359
Blood Product Expiration Date: 201906302359
ISSUE DATE / TIME: 201906171755
ISSUE DATE / TIME: 201906180850
ISSUE DATE / TIME: 201906181849
ISSUE DATE / TIME: 201906190003
ISSUE DATE / TIME: 201906200815
ISSUE DATE / TIME: 201906172106
ISSUE DATE / TIME: 201906180516
UNIT TYPE AND RH: 600
UNIT TYPE AND RH: 600
UNIT TYPE AND RH: 600
UNIT TYPE AND RH: 600
Unit Type and Rh: 600
Unit Type and Rh: 600
Unit Type and Rh: 600

## 2017-10-03 LAB — HEMOGLOBIN AND HEMATOCRIT, BLOOD
HCT: 24.6 % — ABNORMAL LOW (ref 36.0–46.0)
Hemoglobin: 7.7 g/dL — ABNORMAL LOW (ref 12.0–15.0)

## 2017-10-03 MED ORDER — TRANEXAMIC ACID 650 MG PO TABS: 1300.0000 mg | ORAL_TABLET | Freq: Two times a day (BID) | ORAL | 0 refills | Status: AC

## 2017-10-03 NOTE — Progress Notes (Addendum)
   Subjective: [name] feels well this morning. No acute complaints. Denies dyspnea or abdominal pain. She does report some melena overnight. She does think she passed the pill endoscopy camera this morning.  Objective: Vital signs in last 24 hours: Vitals:   10/02/17 2046 10/03/17 0440 10/03/17 0500 10/03/17 0855  BP: 116/70 (!) 129/59  (!) 115/55  Pulse: (!) 54 (!) 56  65  Resp: 18 18    Temp: 98.4 F (36.9 C) 98.8 F (37.1 C)    TempSrc: Oral Oral    SpO2: 100% 97%    Weight:   118.3 kg (260 lb 11.2 oz)   Height:       Weight change: 4.4 kg (9 lb 11.2 oz)  Intake/Output Summary (Last 24 hours) at 10/03/2017 1033 Last data filed at 10/03/2017 0900 Gross per 24 hour  Intake 808 ml  Output 350 ml  Net 458 ml   GENERAL: Well-appearing woman lying comfortably in bed. Appears older than stated age. NEURO: Mental status not formally assessed but grossly intact. Strength and coordination grossly intact. CARDIOVASCULAR: Regular rate and rhythm. 2/6 blowing crescendo-decrescendo systolic murmur heard throughout the precordium but loudest at the left second intercostal space. Murmur obscures S1. S2 normal. PULM: Normal work of breathing. Lungs clear to auscultation bilaterally with no wheezes, rales, or ronchi ABDOMINAL: Non-distended.Non-tender.Normoactive bowel sounds. 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 is a 57 year old woman with a history of HHT who presented with anemia secondary to a recurrent GI bleed. She remains admitted for further evaluation of her GI bleeding with pill endoscopy.  Anemia  secondary to GI bleed Hemoglobin rose from 6.9 to 7.7 with 1 unit of pRBC's. She has not had any melena for approximately 24 hrs. Her expected hemoglobin rise and improvement in melena suggest that tranexamic acid may be minimizing GI blood loss. - Follow up results from pill endoscopy - H/H at 1500. Transfuse if Hgb <7 - Tranexamic acid 1300 BID - Follow up appointment with Dr. Morey Hummingbird at [facility] 6/25 at 11:15 - Referral made to [place] [facility]  DVT ppx: SCD's GI HYW:[id number] Diet:Regular Dispo:Stepdown level.Possible discharge today (6/21) pending results of capsule endoscopy Code status:Full code   LOS: 4 days   Doristine Johns, Medical Student 10/03/2017, 10:33 AM   Attestation for Student Documentation:  I personally was present and performed or re-performed the history, physical exam and medical decision-making activities of this service and have verified that the service and findings are accurately documented in the student's note.  Ledell Noss, MD 10/03/2017, 1:36 PM

## 2017-10-03 NOTE — Progress Notes (Signed)
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 Capsule endoscopy was performed for acute posthemorrhagic anemia.  After obtaining an informed consent, the patient was advised to swallow a PillCam.  Findings: It stayed at the GE junction for almost 5 hours,thereafter,visualization was obscured due to large amount of retained food in the gastric cavity. It is unclear whether the PillCam exited the stomach was small bowel.  Summary and recommendations: Unsuccessful PillCam study due to large amount of retained food obscuring visualization.   Monitor H&H. Abdominal x-ray to ensure passage of PillCam. Repeat PillCam study as an outpatient if hemoglobin drops further.   [name], M.D.

## 2017-10-03 NOTE — Discharge Instructions (Signed)
You were hospitalized because you had another episode in which you were bleeding into your intestines. As you know at this point, you are prone to getting these due to your bleeding disorder, called hereditary hemorrhagic telangiectasias, or HHT. We gave you some blood to help replace the blood you lost. Dr. Therisa Doyne, the gastroenterologist (stomach doctor), put a camera down into your stomach twice, and saw some abnormal blood vessels in there that are likely causing your bleeding. She also saw a stomach ulcer that isn't bleeding right now, but may be contributing to your blood loss as well. You also swallowed a camera, which took pictures of the inside of your stomach. Unfortunately, there was a lot of food in your stomach, and Dr. Therisa Doyne was not able to see much.  We started you on a new medication called tranexamic acid to thicken your blood and stop some of this bleeding. You will take this just until Sunday. You will need to discuss with your hematologist (blood doctor) whether or not you should keep taking this medicine long term.  We ask that you come to your follow-up appointments, both in our clinic and with the hematologist (blood doctor). If you do experience any light-headedness, dizziness, changes in vision, or black or red stools, you should call our clinic at [phone]. If you feel short of breath or like you might pass out, you should call 911 or go straight to the emergency room.  We are glad you are feeling better. It has been a pleasure taking care of you, and we wish you all the best!

## 2017-10-03 NOTE — Discharge Summary (Signed)
Name: Peggy House MRN: [id number] DOB: 1961/03/29 57 y.o. PCP: Einar Gip, DO  Date of Admission: 09/29/2017  5:22 PM Date of Discharge: 10/03/17 Attending Physician: Axel Filler, *  Discharge Diagnosis: Hereditary hemorrhagic telangiectasias Upper GI bleed Anemia  Discharge Medications: Allergies as of 10/03/2017      Reactions   Nsaids Other (See Comments)   Stomach bleeding episodes; Tylenol is OK   Tomato Hives      Medication List    STOP taking these medications   ferrous sulfate 325 (65 FE) MG tablet     TAKE these medications   acetaminophen 325 MG tablet Commonly known as:  TYLENOL Take 650 mg by mouth every 6 (six) hours as needed for mild pain or headache.   albuterol 108 (90 Base) MCG/ACT inhaler Commonly known as:  PROVENTIL HFA;VENTOLIN HFA Inhale 1-2 puffs into the lungs every 6 (six) hours as needed for wheezing or shortness of breath.   atorvastatin 80 MG tablet Commonly known as:  LIPITOR take 1 tablet by mouth once daily   ferrous gluconate 324 MG tablet Commonly known as:  FERGON Take 1 tablet (324 mg total) by mouth 3 (three) times daily with meals. What changed:  when to take this   metoprolol succinate 100 MG 24 hr tablet Commonly known as:  TOPROL-XL Take 1 tablet (100 mg total) by mouth daily. Take with or immediately following a meal.   pantoprazole 40 MG tablet Commonly known as:  PROTONIX Take 1 tablet (40 mg total) by mouth 2 (two) times daily.   sertraline 50 MG tablet Commonly known as:  ZOLOFT Take 1 tablet (50 mg total) by mouth daily.   tranexamic acid 650 MG Tabs tablet Commonly known as:  LYSTEDA Take 2 tablets (1,300 mg total) by mouth 2 (two) times daily for 2 days.   vitamin C 500 MG tablet Commonly known as:  ASCORBIC ACID Take 500 mg by mouth daily.      Disposition and follow-up:   Peggy House was discharged from [facility] in Stable condition.  At the hospital follow  up visit please address:  1.  Repeat H/H  2.  Consider referral to [facility] for outpatient capsule endoscopy if hemoglobin drops further  3.  Consider resuming systemic anti-thrombolytic therapy (I.e. Tranexamic acid) if dropping H/H  4.  Discuss possible referral to [facility]. Referral number is [id number].  5.  Labs / imaging needed at time of follow-up: Hemoglobin and hematocrit as above  6.  Pending labs/ test needing follow-up: None  Follow-up Appointments:  No follow-ups on file.  Hospital Course by problem list: Peggy House is a 57 year old woman with a history of hereditary hemorrhagic telangiectasias who was recently admitted for GI bleed who presented as a direct admission from clinic for anemia secondary to a recurrent GI bleed.  Anemia secondary to GI bleed Peggy House was recently admitted from 6/9 to 6/13 for symptomatic anemia secondary to GI bleed. At her hospital follow up appointment, she was found to have a Hgb of 4.7. She also endorsed some lightheadedness and complained of decreased peripheral vision when standing. She had persistent melena from the time of prior admission. 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 Repeat EGD the following day revealed one non-bleeding ulcer in the duodenum and multiple telangiectasias in the  stomach, which were treated with argon plasma coagulation. She was started on tranexamic acid due to ongoing melena and Hgb <7 despite 5 units pRBC's. Capsule endoscopy was unsuccessful, and revealed only retained food in the stomach.  She received a total of 6 units of blood. Her hemoglobin at discharge was 7.7.  Hypernatremia Na of 147 on 6/19 in the setting of poor PO intake and frequent NPO status. She was put on maintenance fluids of normal saline at 100 mL/hr for the next 48 hrs.  Discharge  Vitals:   BP (!) 115/55   Pulse 65   Temp 98.8 F (37.1 C) (Oral)   Resp 18   Ht 5\' 5"  (1.651 m)   Wt 260 lb 11.2 oz (118.3 kg)   SpO2 97%   BMI 43.38 kg/m   Pertinent Labs, Studies, and Procedures:  CBC Latest Ref Rng & Units 10/03/2017 10/02/2017 10/02/2017  WBC 4.0 - 10.5 K/uL - - 16.1(H)  Hemoglobin 12.0 - 15.0 g/dL 7.7(L) 7.7(L) 6.9(LL)  Hematocrit 36.0 - 46.0 % 24.6(L) 24.6(L) 22.4(L)  Platelets 150 - 400 K/uL - - 235   INR 1.25 APTT 27  Discharge Instructions: Discharge Instructions    Call MD for:  difficulty breathing, headache or visual disturbances   Complete by:  As directed    Call MD for:  persistant dizziness or light-headedness   Complete by:  As directed    Call MD for:  persistant nausea and vomiting   Complete by:  As directed    Diet - low sodium heart healthy   Complete by:  As directed    Increase activity slowly   Complete by:  As directed     You were hospitalized because you had another episode in which you were bleeding into your intestines. As you know at this point, you are prone to getting these due to your bleeding disorder, called hereditary hemorrhagic telangiectasias, or HHT. We gave you some blood to help replace the blood you lost. Dr. Therisa Doyne, the gastroenterologist (stomach doctor), put a camera down into your stomach twice, and saw some abnormal blood vessels in there that are likely causing your bleeding. She also saw a stomach ulcer that isn't bleeding right now, but may be contributing to your blood loss as well. You also swallowed a camera, which took pictures of the inside of your stomach. Unfortunately, there was a lot of food in your stomach, and Dr. Therisa Doyne was not able to see much.  We started you on a new medication called tranexamic acid to thicken your blood and stop some of this bleeding. You will take this just until Sunday. You will need to discuss with your hematologist (blood doctor) whether or not you should keep taking this  medicine long term.  We ask that you come to your follow-up appointments, both in our clinic and with the hematologist (blood doctor). If you do experience any light-headedness, dizziness, changes in vision, or black or red stools, you should call our clinic at [phone]. If you feel short of breath or like you might pass out, you should call 911 or go straight to the emergency room.  We are glad you are feeling better. It has been a pleasure taking care of you, and we wish you all the best!  Signed: Ledell Noss, MD 10/03/2017, 3:35 PM   Pager: [phone]

## 2017-10-03 NOTE — Plan of Care (Signed)
  Problem: Clinical Measurements: Goal: Ability to maintain clinical measurements within normal limits will improve Outcome: Progressing   

## 2017-10-06 ENCOUNTER — Other Ambulatory Visit: Payer: Self-pay

## 2017-10-06 ENCOUNTER — Encounter (HOSPITAL_COMMUNITY): Payer: Self-pay | Admitting: Emergency Medicine

## 2017-10-06 ENCOUNTER — Inpatient Hospital Stay (HOSPITAL_COMMUNITY)
Admission: EM | Admit: 2017-10-06 | Discharge: 2017-10-10 | DRG: 378 | Disposition: A | Discharge status: Home Health | Payer: Medicaid Other | Attending: Student in an Organized Health Care Education/Training Program | Admitting: Student in an Organized Health Care Education/Training Program

## 2017-10-06 DIAGNOSIS — T454X5A Adverse effect of iron and its compounds, initial encounter: Secondary | ICD-10-CM | POA: Diagnosis not present

## 2017-10-06 DIAGNOSIS — Z9889 Other specified postprocedural states: Secondary | ICD-10-CM

## 2017-10-06 DIAGNOSIS — Z91018 Allergy to other foods: Secondary | ICD-10-CM

## 2017-10-06 DIAGNOSIS — I10 Essential (primary) hypertension: Secondary | ICD-10-CM | POA: Diagnosis present

## 2017-10-06 DIAGNOSIS — Z6841 Body Mass Index (BMI) 40.0 and over, adult: Secondary | ICD-10-CM

## 2017-10-06 DIAGNOSIS — I780 Hereditary hemorrhagic telangiectasia: Secondary | ICD-10-CM | POA: Diagnosis present

## 2017-10-06 DIAGNOSIS — D500 Iron deficiency anemia secondary to blood loss (chronic): Secondary | ICD-10-CM | POA: Diagnosis present

## 2017-10-06 DIAGNOSIS — K922 Gastrointestinal hemorrhage, unspecified: Secondary | ICD-10-CM | POA: Diagnosis present

## 2017-10-06 DIAGNOSIS — K219 Gastro-esophageal reflux disease without esophagitis: Secondary | ICD-10-CM | POA: Diagnosis present

## 2017-10-06 DIAGNOSIS — K5521 Angiodysplasia of colon with hemorrhage: Secondary | ICD-10-CM

## 2017-10-06 DIAGNOSIS — R011 Cardiac murmur, unspecified: Secondary | ICD-10-CM | POA: Diagnosis not present

## 2017-10-06 DIAGNOSIS — F339 Major depressive disorder, recurrent, unspecified: Secondary | ICD-10-CM

## 2017-10-06 DIAGNOSIS — D62 Acute posthemorrhagic anemia: Secondary | ICD-10-CM | POA: Diagnosis present

## 2017-10-06 DIAGNOSIS — F17210 Nicotine dependence, cigarettes, uncomplicated: Secondary | ICD-10-CM | POA: Diagnosis present

## 2017-10-06 DIAGNOSIS — K921 Melena: Principal | ICD-10-CM | POA: Diagnosis present

## 2017-10-06 DIAGNOSIS — Z91038 Other insect allergy status: Secondary | ICD-10-CM

## 2017-10-06 DIAGNOSIS — R269 Unspecified abnormalities of gait and mobility: Secondary | ICD-10-CM | POA: Diagnosis not present

## 2017-10-06 DIAGNOSIS — K269 Duodenal ulcer, unspecified as acute or chronic, without hemorrhage or perforation: Secondary | ICD-10-CM

## 2017-10-06 DIAGNOSIS — K5520 Angiodysplasia of colon without hemorrhage: Secondary | ICD-10-CM | POA: Diagnosis present

## 2017-10-06 DIAGNOSIS — Z79899 Other long term (current) drug therapy: Secondary | ICD-10-CM | POA: Diagnosis not present

## 2017-10-06 DIAGNOSIS — E785 Hyperlipidemia, unspecified: Secondary | ICD-10-CM

## 2017-10-06 DIAGNOSIS — Z832 Family history of diseases of the blood and blood-forming organs and certain disorders involving the immune mechanism: Secondary | ICD-10-CM

## 2017-10-06 DIAGNOSIS — R040 Epistaxis: Secondary | ICD-10-CM | POA: Diagnosis not present

## 2017-10-06 DIAGNOSIS — R0602 Shortness of breath: Secondary | ICD-10-CM | POA: Diagnosis not present

## 2017-10-06 DIAGNOSIS — Q2733 Arteriovenous malformation of digestive system vessel: Secondary | ICD-10-CM | POA: Diagnosis present

## 2017-10-06 DIAGNOSIS — Z886 Allergy status to analgesic agent status: Secondary | ICD-10-CM | POA: Diagnosis not present

## 2017-10-06 LAB — COMPREHENSIVE METABOLIC PANEL
ALBUMIN: 2.1 g/dL — AB (ref 3.5–5.0)
ALK PHOS: 52 U/L (ref 38–126)
ALT: 6 U/L — ABNORMAL LOW (ref 14–54)
AST: 11 U/L — AB (ref 15–41)
Anion gap: 8 (ref 5–15)
BUN: 15 mg/dL (ref 6–20)
CALCIUM: 7.9 mg/dL — AB (ref 8.9–10.3)
CO2: 23 mmol/L (ref 22–32)
CREATININE: 0.84 mg/dL (ref 0.44–1.00)
Chloride: 109 mmol/L (ref 101–111)
GFR calc Af Amer: >60 mL/min (ref >60)
GFR calc non Af Amer: >60 mL/min (ref >60)
Glucose, Bld: 96 mg/dL (ref 65–99)
Potassium: 3.6 mmol/L (ref 3.5–5.1)
Sodium: 140 mmol/L (ref 135–145)
TOTAL PROTEIN: 4.5 g/dL — AB (ref 6.5–8.1)
Total Bilirubin: 0.4 mg/dL (ref 0.3–1.2)

## 2017-10-06 LAB — URINALYSIS, ROUTINE W REFLEX MICROSCOPIC
BILIRUBIN URINE: NEGATIVE
GLUCOSE, UA: NEGATIVE mg/dL
Hgb urine dipstick: NEGATIVE
KETONES UR: NEGATIVE mg/dL
Leukocytes, UA: NEGATIVE
NITRITE: NEGATIVE
Protein, ur: NEGATIVE mg/dL
Specific Gravity, Urine: 1.016 (ref 1.005–1.030)
pH: 5 (ref 5.0–8.0)

## 2017-10-06 LAB — I-STAT BETA HCG BLOOD, ED (MC, WL, AP ONLY): I-stat hCG, quantitative: <5 m[IU]/mL (ref <5)

## 2017-10-06 LAB — PROTIME-INR
INR: 1.37
Prothrombin Time: 16.7 seconds — ABNORMAL HIGH (ref 11.4–15.2)

## 2017-10-06 LAB — CBC
HCT: 16.2 % — ABNORMAL LOW (ref 36.0–46.0)
Hemoglobin: 4.7 g/dL — CL (ref 12.0–15.0)
MCH: 29.6 pg (ref 26.0–34.0)
MCHC: 29 g/dL — ABNORMAL LOW (ref 30.0–36.0)
MCV: 101.9 fL — ABNORMAL HIGH (ref 78.0–100.0)
PLATELETS: 395 10*3/uL (ref 150–400)
RBC: 1.59 MIL/uL — AB (ref 3.87–5.11)
RDW: 19.1 % — ABNORMAL HIGH (ref 11.5–15.5)
WBC: 17.4 10*3/uL — AB (ref 4.0–10.5)

## 2017-10-06 LAB — I-STAT TROPONIN, ED: Troponin i, poc: 0 ng/mL (ref 0.00–0.08)

## 2017-10-06 LAB — PREPARE RBC (CROSSMATCH)

## 2017-10-06 LAB — POC OCCULT BLOOD, ED: Fecal Occult Bld: POSITIVE — AB

## 2017-10-06 MED ORDER — SODIUM CHLORIDE 0.9 % IV SOLN: 10.0000 mL/h | Freq: Once | INTRAVENOUS | Status: DC

## 2017-10-06 MED ORDER — SODIUM CHLORIDE 0.9 % IV BOLUS
1000.0000 mL | Freq: Once | INTRAVENOUS | Status: AC
  Administered 2017-10-06: 1000 mL via INTRAVENOUS

## 2017-10-06 MED ORDER — ONDANSETRON HCL 4 MG/2ML IJ SOLN: 4.0000 mg | Freq: Four times a day (QID) | INTRAMUSCULAR | Status: DC | PRN

## 2017-10-06 MED ORDER — ONDANSETRON HCL 4 MG PO TABS: 4.0000 mg | ORAL_TABLET | Freq: Four times a day (QID) | ORAL | Status: DC | PRN

## 2017-10-06 MED ORDER — ACETAMINOPHEN 650 MG RE SUPP: 650.0000 mg | Freq: Four times a day (QID) | RECTAL | Status: DC | PRN

## 2017-10-06 MED ORDER — SODIUM CHLORIDE 0.9 % IV SOLN
INTRAVENOUS | Status: DC
  Administered 2017-10-06 – 2017-10-09 (×6): via INTRAVENOUS

## 2017-10-06 MED ORDER — ACETAMINOPHEN 325 MG PO TABS: 650.0000 mg | ORAL_TABLET | Freq: Four times a day (QID) | ORAL | Status: DC | PRN

## 2017-10-06 MED ORDER — SERTRALINE HCL 50 MG PO TABS
50.0000 mg | ORAL_TABLET | Freq: Every day | ORAL | Status: DC
  Administered 2017-10-07 – 2017-10-10 (×3): 50 mg via ORAL
  Filled 2017-10-06 (×3): qty 1

## 2017-10-06 MED ORDER — ATORVASTATIN CALCIUM 80 MG PO TABS
80.0000 mg | ORAL_TABLET | Freq: Every day | ORAL | Status: DC
  Administered 2017-10-07 – 2017-10-10 (×3): 80 mg via ORAL
  Filled 2017-10-06 (×3): qty 1

## 2017-10-06 MED ORDER — SODIUM CHLORIDE 0.9 % IV SOLN
8.0000 mg/h | INTRAVENOUS | Status: AC
  Administered 2017-10-06 – 2017-10-09 (×6): 8 mg/h via INTRAVENOUS
  Filled 2017-10-06 (×10): qty 80

## 2017-10-06 MED ORDER — ALBUTEROL SULFATE (2.5 MG/3ML) 0.083% IN NEBU: 3.0000 mL | INHALATION_SOLUTION | Freq: Four times a day (QID) | RESPIRATORY_TRACT | Status: DC | PRN

## 2017-10-06 MED ORDER — SODIUM CHLORIDE 0.9 % IV SOLN
80.0000 mg | Freq: Once | INTRAVENOUS | Status: AC
  Administered 2017-10-06: 80 mg via INTRAVENOUS
  Filled 2017-10-06: qty 80

## 2017-10-06 NOTE — ED Notes (Signed)
Admitting at bedside 

## 2017-10-06 NOTE — H&P (Signed)
Date: 10/06/2017               Patient Name:  Peggy House MRN: [id number]  DOB: [birth year] Age / Sex: 57 y.o., female   PCP: Einar Gip, DO         Medical Service: Internal Medicine Teaching Service         Attending Physician: Dr. Evette Doffing, Mallie Mussel, *    First Contact: Dr. Shan Levans Pager: [phone]  Second Contact: Dr. Hetty Ely Pager: [phone]       After Hours (After 5p/  First Contact Pager: [phone]  weekends / holidays): Second Contact Pager: [phone]   Chief Complaint: Bright red blood per rectum   History of Present Illness: Peggy House is a 57 y.o female with Hereditary Hemorrhagic Telangiectasias who was recently admitted on 6/09 after being admitted for symptomatic anemia secondary to GI bleed. 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 The AVMs were treated acutely with argon plasma coagulation and she was started on tranexamic acid. She also underwent capsule endoscopy that was unsuccessful. Her hemoglobin stabilized over the course of the hospitalization and she was discharged in stable condition on 6/21. Tranexamic acid was not continued on discharge due to the risk of clotting and vascular events.   Shortly after discharge on 6/22 the patient began having blood in her stools again. She has had 3-4 BM per day that are watery and bloody in nature. She has noticed that her stools are more black now and appear to be slowing down. She has had one BM since getting to the ED and it was black. In addition she has started to have dizziness and lethargy.   She denies fevers, chills, N/V, abdominal pain, CP, SOB, cough, hematuria, dysuria, rash, myalgias, arthralgias.   Meds:  Current Meds  Medication Sig  . acetaminophen (TYLENOL) 325 MG tablet Take 650 mg by mouth every 6 (six) hours as needed for mild pain or headache.   . albuterol  (PROVENTIL HFA;VENTOLIN HFA) 108 (90 Base) MCG/ACT inhaler Inhale 1-2 puffs into the lungs every 6 (six) hours as needed for wheezing or shortness of breath.  [name] atorvastatin (LIPITOR) 80 MG tablet take 1 tablet by mouth once daily  . metoprolol succinate (TOPROL-XL) 100 MG 24 hr tablet Take 1 tablet (100 mg total) by mouth daily. Take with or immediately following a meal.  . pantoprazole (PROTONIX) 40 MG tablet Take 1 tablet (40 mg total) by mouth 2 (two) times daily.  . sertraline (ZOLOFT) 50 MG tablet Take 1 tablet (50 mg total) by mouth daily.   Allergies: Allergies as of 10/06/2017 - Review Complete 10/06/2017  Allergen Reaction Noted  . Nsaids Other (See Comments) 04/25/2014  . Tomato Hives 01/01/2012  . Wasp venom Swelling 10/06/2017   Past Medical History:  Diagnosis Date  . Anxiety   . Arthritis    knnes,back  . GERD (gastroesophageal reflux disease)   . History of swelling of feet   . Hyperlipidemia   . Hypertension   . Major depressive disorder, recurrent episode ([place]) 06/05/2015  . Obesity   . Snores    Family History:  Mother: + Ovarian cancer, DM, CKD, Bleeding disorder  Sister: + DM, Bleeding disorder Sister: + DM  Family: + Stomach cancer, colon cancer, crohn's disease  Social History:  Lives with her husband  Smokes 1/2 PPD Denies use  of EtOH or illicit substances  Review of Systems: A complete ROS was negative except as per HPI.   Physical Exam: Blood pressure 120/60, pulse 62, temperature 99 F (37.2 C), temperature source Oral, resp. rate 19, height 5\' 5"  (1.651 m), weight 250 lb (113.4 kg), SpO2 100 %.  General: Obese female in no acute distress HENT: Normocephalic, atraumatic, moist mucus membranes Pulm: Good air movement with no wheezing or crackles  CV: RRR, systolic murmur noted Abdomen: Active bowel sounds, soft, non-distended, no tenderness to palpation  Extremities: Pulses palpable in all extremities, mild LE edema Skin: Warm and dry    Neuro: Alert and oriented x 3  EKG: personally reviewed: my interpretation is low voltage sinus rhythm with normal axis and intervals. Appears to be different morphology QRS complexes in the lateral leads compared to prior EKGs. No ST elevation, TWI, or new conduction abnormalities.   Assessment & Plan by Problem: Active Problems:   GI bleed  Peggy House is a 57 y.o female with Hereditary Hemorrhagic Telangiectasias who presented to the ED with signs and symptoms of symptomatic anemia due to GI bleed and HHT.   Symptomatic Anemia due to GI bleed and HHT. Upon presentation the patient was not tachycardic (however is on a beta blocker) or hypotensive. 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 Colonoscopy in 2016 without diverticula. Likely etiology is recurrent AVM bleeding. Two large bore IVs were placed and initial labs were remarkable for a CBC of 4.7. She received 2 units of PRBCs. GI was consulted who recommended starting a high dose PPI and admission.  - Give 1083mL NS bolus followed by maintenance fluids  - Maintain 2 large bore IVs throughout hospitalization  - Start PPI infusion per GI (IV BID dosing on critical national shortage) - Consider restarting Tranexamic acid 1300 mg BID if she begins to experience recurrent bright red blood per rectum  - Trend H&H  - Appreciate GI consultation and recommendations  Hypertension  - Hold Metoprolol-XL 100mg  QD  GERD - Stop PO pantoprazole and switch to IV Pantoprazole for GI bleed   MDD - Continue Sertraline 50 mg QD  HLD - Continue atorvastatin 40 mg QD  Diet: NPO  VTE ppx: SCDs CODE STATUS: Full Code  Dispo: Admit patient to Inpatient with expected length of stay greater than 2 midnights.  Signed: Ina Homes, MD 10/06/2017, 8:06 PM  My Pager: [phone]

## 2017-10-06 NOTE — ED Notes (Signed)
Attempted to call report, advised chart needs review as floor is maxed out on SD patients, AC to review.

## 2017-10-06 NOTE — Progress Notes (Signed)
  Pt admitted to the unit. Pt is stable, alert and oriented per baseline. Oriented to room, staff, and call bell. Educated to call for any assistance. Bed in lowest position, call bell within reach- will continue to monitor. 

## 2017-10-06 NOTE — ED Triage Notes (Signed)
Pt states she was just admitted for GI bleed and received several transfusions. Pt started having blood in her stool again on Saturday. Denies, abd pain,  N/V/diarrhea. Denies any other symptoms.

## 2017-10-06 NOTE — ED Provider Notes (Signed)
[facility] EMERGENCY DEPARTMENT Provider Note   CSN: [id number] Arrival date & time: 10/06/17  1455     History   Chief Complaint Chief Complaint  Patient presents with  . GI Bleeding    HPI Peggy House is a 57 y.o. 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  Bleeding began Saturday morning.  Described as bright red blood and dark brown, watery diarrhea, 3-4 times a day since.  Has only had one bowel movement today.  She feels more sleepy and shortness of breath with exertion.  She has no fevers, chills, nausea, vomiting, abdominal pain, chest pain, dysuria, hematuria.  She is supposed to start taking injections for anemia.  Chart review reveals patient was discharged on 6/21 for anemia secondary to recurrent GI bleed. She had EGD with APC, tranexamic acid multiple AVM in stomach.  HPI  Past Medical History:  Diagnosis Date  . Anxiety   . Arthritis    knnes,back  . GERD (gastroesophageal reflux disease)   . History of swelling of feet   . Hyperlipidemia   . Hypertension   . Major depressive disorder, recurrent episode ([place]) 06/05/2015  . Obesity   . Snores     Patient Active Problem List   Diagnosis Date Noted  . Upper GI bleed 09/29/2017  . Hematemesis 09/22/2017  . Endotracheally intubated 09/22/2017  . Chronic anemia 05/20/2017  . 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 Venous stasis dermatitis of both lower extremities 05/07/2017  . Recurrent epistaxis 11/29/2016  . Iron deficiency anemia 10/29/2016  . Leukocytosis 05/13/2016  . Osteoarthritis, multiple sites 06/05/2015  . Insomnia 06/05/2015  . Major depressive disorder, recurrent episode ([place]) 06/05/2015  . Morbid obesity ([place]) 04/26/2014  . Gastric AVM 04/26/2014  . GI bleed 04/25/2014  . HLD (hyperlipidemia) 04/25/2014  . Leg  swelling 04/25/2014  . Essential hypertension   . Acute blood loss anemia 04/07/2014    Past Surgical History:  Procedure Laterality Date  . ABDOMINAL HYSTERECTOMY    . CARPAL TUNNEL RELEASE  05/13/2011   Procedure: CARPAL TUNNEL RELEASE;  Surgeon: Nita Sells, MD;  Location: [place];  Service: Orthopedics;  Laterality: Left;  . COLONOSCOPY WITH PROPOFOL N/A 04/28/2014   Procedure: COLONOSCOPY WITH PROPOFOL;  Surgeon: Cleotis Nipper, MD;  Location: [facility] ENDOSCOPY;  Service: Endoscopy;  Laterality: N/A;  . DG TOES*L*  2/10   rt  . DILATION AND CURETTAGE OF UTERUS    . ESOPHAGOGASTRODUODENOSCOPY N/A 04/10/2014   Procedure: ESOPHAGOGASTRODUODENOSCOPY (EGD);  Surgeon: [name], MD;  Location: [facility] ENDOSCOPY;  Service: Endoscopy;  Laterality: N/A;  . ESOPHAGOGASTRODUODENOSCOPY N/A 05/10/2017   Procedure: ESOPHAGOGASTRODUODENOSCOPY (EGD);  Surgeon: Ronald Lobo, MD;  Location: [facility] ENDOSCOPY;  Service: Endoscopy;  Laterality: N/A;  . ESOPHAGOGASTRODUODENOSCOPY N/A 09/22/2017   Procedure: ESOPHAGOGASTRODUODENOSCOPY (EGD);  Surgeon: Clarene Essex, MD;  Location: [place];  Service: Endoscopy;  Laterality: N/A;  bedside  . ESOPHAGOGASTRODUODENOSCOPY (EGD) WITH PROPOFOL N/A 04/27/2014   Procedure: ESOPHAGOGASTRODUODENOSCOPY (EGD) WITH PROPOFOL;  Surgeon: Cleotis Nipper, MD;  Location: [place];  Service: Endoscopy;  Laterality: N/A;  possible apc  . ESOPHAGOGASTRODUODENOSCOPY (EGD) WITH PROPOFOL N/A 09/30/2017   Procedure: ESOPHAGOGASTRODUODENOSCOPY (EGD) WITH PROPOFOL;  Surgeon: [name], MD;  Location: [place];  Service: Gastroenterology;  Laterality: N/A;  . ESOPHAGOGASTRODUODENOSCOPY (EGD) WITH PROPOFOL N/A 10/01/2017   Procedure: ESOPHAGOGASTRODUODENOSCOPY (EGD) WITH PROPOFOL;  Surgeon: [name], MD;  Location: [place];  Service: Gastroenterology;  Laterality: N/A;  . GIVENS CAPSULE STUDY N/A 10/02/2017   Procedure: GIVENS CAPSULE STUDY;   Surgeon: [name], MD;  Location: [place];  Service: Gastroenterology;  Laterality: N/A;  . HOT HEMOSTASIS N/A 04/27/2014   Procedure: HOT HEMOSTASIS (ARGON PLASMA COAGULATION/BICAP);  Surgeon: Cleotis Nipper, MD;  Location[facility] ENDOSCOPY;  Service: Endoscopy;  Laterality: N/A;  . HOT HEMOSTASIS N/A 09/30/2017   Procedure: HOT HEMOSTASIS (ARGON PLASMA COAGULATION/BICAP);  Surgeon: [name], MD;  Location: [place];  Service: Gastroenterology;  Laterality: N/A;  . HOT HEMOSTASIS N/A 10/01/2017   Procedure: HOT HEMOSTASIS (ARGON PLASMA COAGULATION/BICAP);  Surgeon: [name], MD;  Location: [place];  Service: Gastroenterology;  Laterality: N/A;  . L shoulder Surgery  2011  . SUBMUCOSAL INJECTION  09/22/2017   Procedure: SUBMUCOSAL INJECTION;  Surgeon: Clarene Essex, MD;  Location: [place];  Service: Endoscopy;;     OB History   None      Home Medications    Prior to Admission medications   Medication Sig Start Date End Date Taking? Authorizing Provider  acetaminophen (TYLENOL) 325 MG tablet Take 650 mg by mouth every 6 (six) hours as needed for mild pain or headache.    Yes [provider]  albuterol (PROVENTIL HFA;VENTOLIN HFA) 108 (90 Base) MCG/ACT inhaler Inhale 1-2 puffs into the lungs every 6 (six) hours as needed for wheezing or shortness of breath. 04/03/17  Yes Molt, Bethany, DO  atorvastatin (LIPITOR) 80 MG tablet take 1 tablet by mouth once daily 01/11/17  Yes Molt, Bethany, DO  metoprolol succinate (TOPROL-XL) 100 MG 24 hr tablet Take 1 tablet (100 mg total) by mouth daily. Take with or immediately following a meal. 09/09/17  Yes Molt, Bethany, DO  pantoprazole (PROTONIX) 40 MG tablet Take 1 tablet (40 mg total) by mouth 2 (two) times daily. 09/25/17  Yes Kathi Ludwig, MD  sertraline (ZOLOFT) 50 MG tablet Take 1 tablet (50 mg total) by mouth daily. 09/09/17  Yes Molt, Bethany, DO  ferrous gluconate (FERGON) 324 MG tablet Take 1 tablet (324 mg  total) by mouth 3 (three) times daily with meals. Patient taking differently: Take 324 mg by mouth daily with breakfast.  12/25/16 09/29/17  Ardath Sax, MD    Family History Family History  Problem Relation Age of Onset  . Cancer Mother        Ovarian  . Ovarian cancer Mother   . Diabetes Mother   . Kidney disease Mother   . Bleeding Disorder Mother   . Diabetes type II Sister   . Bleeding Disorder Sister   . Diabetes Sister   . Colon cancer Maternal Grandfather   . Crohn's disease Maternal Grandfather   . Stomach cancer Maternal Grandmother   . Kidney disease Son   . Bleeding Disorder Son   . Dysmenorrhea Neg Hx     Social History Social History   Tobacco Use  . Smoking status: Current Every Day Smoker    Packs/day: 0.50    Years: 30.00    Pack years: 15.00    Types: Cigarettes  . Smokeless tobacco: Former Systems developer    Types: Snuff    Quit date: 61  . Tobacco comment: 1/2 PPD  Substance Use Topics  . Alcohol use: No    Alcohol/week: 0.0 oz  . Drug use: No    Comment: last cocaine-2010     Allergies   Nsaids; Tomato; and Wasp venom  Review of Systems Review of Systems  Constitutional: Positive for fatigue.  Respiratory: Positive for shortness of breath.   Gastrointestinal: Positive for blood in stool.  All other systems reviewed and are negative.    Physical Exam Updated Vital Signs BP (!) 122/59   Pulse 62   Temp 99 F (37.2 C) (Oral)   Resp 13   Ht 5\' 5"  (1.651 m)   Wt 113.4 kg (250 lb)   SpO2 100%   BMI 41.60 kg/m   Physical Exam  Constitutional: She is oriented to person, place, and time. She appears well-developed and well-nourished. No distress.  Non toxic  HENT:  Head: Normocephalic and atraumatic.  Nose: Nose normal.  Pale conjunctiva. Moist mucous membranes   Eyes: Pupils are equal, round, and reactive to light. EOM are normal.  Neck: Normal range of motion.  Cardiovascular: Normal rate, regular rhythm and intact distal  pulses.  2+ DP and radial pulses bilaterally. No LE edema.   Pulmonary/Chest: Effort normal and breath sounds normal.  Abdominal: Soft. Bowel sounds are normal. There is no tenderness.  No G/R/R. No suprapubic or CVA tenderness. Negative Murphy's and McBurney's.   Genitourinary: Rectal exam shows guaiac positive stool.  Genitourinary Comments: Melenotic stool. Normal sphincter tone. No external hemorrhoids or fissures.   Musculoskeletal: Normal range of motion.  Neurological: She is alert and oriented to person, place, and time.  Skin: Skin is warm and dry. Capillary refill takes less than 2 seconds.  Psychiatric: She has a normal mood and affect. Her behavior is normal.  Nursing note and vitals reviewed.    ED Treatments / Results  Labs (all labs ordered are listed, but only abnormal results are displayed) Labs Reviewed  COMPREHENSIVE METABOLIC PANEL - Abnormal; Notable for the following components:      Result Value   Calcium 7.9 (*)    Total Protein 4.5 (*)    Albumin 2.1 (*)    AST 11 (*)    ALT 6 (*)    All other components within normal limits  CBC - Abnormal; Notable for the following components:   WBC 17.4 (*)    RBC 1.59 (*)    Hemoglobin 4.7 (*)    HCT 16.2 (*)    MCV 101.9 (*)    MCHC 29.0 (*)    RDW 19.1 (*)    All other components within normal limits  PROTIME-INR - Abnormal; Notable for the following components:   Prothrombin Time 16.7 (*)    All other components within normal limits  POC OCCULT BLOOD, ED - Abnormal; Notable for the following components:   Fecal Occult Bld POSITIVE (*)    All other components within normal limits  URINALYSIS, ROUTINE W REFLEX MICROSCOPIC  I-STAT TROPONIN, ED  I-STAT BETA HCG BLOOD, ED ([facility], [facility], [facility] ONLY)  TYPE AND SCREEN  PREPARE RBC (CROSSMATCH)    EKG EKG Interpretation  Date/Time:  Monday October 06 2017 17:10:27 EDT Ventricular Rate:  63 PR Interval:    QRS Duration: 92 QT Interval:  460 QTC Calculation: 471 R  Axis:   11 Text Interpretation:  Sinus rhythm Low voltage, precordial leads Borderline T abnormalities, anterior leads Baseline wander in lead(s) V2 V6 No significant change since last tracing Confirmed by Isla Pence [phone]) on 10/06/2017 5:20:10 PM   Radiology No results found.  Procedures .Critical Care Performed by: Kinnie Feil, PA-C Authorized by: Kinnie Feil, PA-C   Critical care provider statement:    Critical care time (minutes):  35   Critical care was necessary to treat or prevent imminent or life-threatening deterioration of the following conditions: acute blood loss anemia requiring transfusion.   Critical care was time spent personally by me on the following activities:  Obtaining history from patient or surrogate, examination of patient, evaluation of patient's response to treatment, discussions with consultants, ordering and review of laboratory studies and re-evaluation of patient's condition   I assumed direction of critical care for this patient from another provider in my specialty: no     (including critical care time)  Medications Ordered in ED Medications  0.9 %  sodium chloride infusion (has no administration in time range)  pantoprazole (PROTONIX) 80 mg in sodium chloride 0.9 % 250 mL (0.32 mg/mL) infusion (has no administration in time range)  pantoprazole (PROTONIX) 80 mg in sodium chloride 0.9 % 100 mL IVPB (0 mg Intravenous Stopped 10/06/17 1822)     Initial Impression / Assessment and Plan / ED Course  I have reviewed the triage vital signs and the nursing notes.  Pertinent labs & imaging results that were available during my care of the patient were reviewed by me and considered in my medical decision making (see chart for details).  Clinical Course as of Oct 06 1905  Mon Oct 06, 2017  1747 Spoke to GI who agrees with admission, protonix, repeat H/H.  Will likely scope tomorrow morning unless pt becomes HD unstable.    [CG]  1903  Calcium(!): 7.9 [CG]  1903 Hemoglobin(!!): 4.7 [CG]  1904 WBC(!): 17.4 [CG]  1904 Fecal Occult Blood, POC(!): POSITIVE [CG]    Clinical Course User Index [CG] Kinnie Feil, PA-C   Likely recocurrence of gastric AVM bleed. She has no abd pain and diverticulitis, ischemic gut less likely.   On exam she is HD stable, has positive melena. No abd tenderness.   Work up remarkable for WBC 17.4, no fever, cough, dysuria.  hgb 4.7 from 7.7 on 6/21. Positive hemoccult with melena. Hypocalcemia.   Spoke to GI who recommends protonix, admit, likely scope tomorrow. Patient admitted to IM for acute blood loss anemia, GI bleed, leukocytosis. Pending UA.  2 units PRBC ordered in ER.   Final Clinical Impressions(s) / ED Diagnoses   Final diagnoses:  Acute blood loss anemia    ED Discharge Orders    None       Arlean Hopping 10/06/17 Romilda Joy, MD 10/06/17 [phone]

## 2017-10-06 NOTE — ED Notes (Signed)
Attempted to call report, per secretary RN needs a few minutes, they will call ED for report.

## 2017-10-06 NOTE — ED Notes (Signed)
Patient had an accident on the bed. Black liquid stool on the sheets.Bedside commode given

## 2017-10-07 ENCOUNTER — Ambulatory Visit: Payer: Medicaid Other | Admitting: Hematology

## 2017-10-07 DIAGNOSIS — D500 Iron deficiency anemia secondary to blood loss (chronic): Secondary | ICD-10-CM

## 2017-10-07 DIAGNOSIS — K922 Gastrointestinal hemorrhage, unspecified: Secondary | ICD-10-CM

## 2017-10-07 DIAGNOSIS — I10 Essential (primary) hypertension: Secondary | ICD-10-CM

## 2017-10-07 LAB — BASIC METABOLIC PANEL
Anion gap: 7 (ref 5–15)
BUN: 13 mg/dL (ref 6–20)
CALCIUM: 7.6 mg/dL — AB (ref 8.9–10.3)
CO2: 23 mmol/L (ref 22–32)
CREATININE: 0.81 mg/dL (ref 0.44–1.00)
Chloride: 115 mmol/L — ABNORMAL HIGH (ref 98–111)
GFR calc Af Amer: >60 mL/min (ref >60)
GFR calc non Af Amer: >60 mL/min (ref >60)
GLUCOSE: 84 mg/dL (ref 70–99)
Potassium: 3.5 mmol/L (ref 3.5–5.1)
Sodium: 145 mmol/L (ref 135–145)

## 2017-10-07 LAB — MRSA PCR SCREENING: MRSA by PCR: NEGATIVE

## 2017-10-07 LAB — HEMOGLOBIN AND HEMATOCRIT, BLOOD
HCT: 25.1 % — ABNORMAL LOW (ref 36.0–46.0)
HEMOGLOBIN: 7.9 g/dL — AB (ref 12.0–15.0)

## 2017-10-07 LAB — CBC
HCT: 18.3 % — ABNORMAL LOW (ref 36.0–46.0)
Hemoglobin: 5.7 g/dL — CL (ref 12.0–15.0)
MCH: 29.1 pg (ref 26.0–34.0)
MCHC: 31.1 g/dL (ref 30.0–36.0)
MCV: 93.4 fL (ref 78.0–100.0)
Platelets: 326 10*3/uL (ref 150–400)
RBC: 1.96 MIL/uL — ABNORMAL LOW (ref 3.87–5.11)
RDW: 18.9 % — AB (ref 11.5–15.5)
WBC: 14.3 10*3/uL — ABNORMAL HIGH (ref 4.0–10.5)

## 2017-10-07 LAB — PREPARE RBC (CROSSMATCH)

## 2017-10-07 MED ORDER — SODIUM CHLORIDE 0.9% IV SOLUTION
Freq: Once | INTRAVENOUS | Status: AC
  Administered 2017-10-07: 10:00:00 via INTRAVENOUS

## 2017-10-07 MED ORDER — BOOST / RESOURCE BREEZE PO LIQD CUSTOM
1.0000 | Freq: Two times a day (BID) | ORAL | Status: DC
  Administered 2017-10-07 – 2017-10-10 (×4): 1 via ORAL

## 2017-10-07 MED ORDER — TRANEXAMIC ACID 650 MG PO TABS
1300.0000 mg | ORAL_TABLET | Freq: Three times a day (TID) | ORAL | Status: DC
  Administered 2017-10-07 – 2017-10-10 (×7): 1300 mg via ORAL
  Filled 2017-10-07 (×13): qty 2

## 2017-10-07 NOTE — Progress Notes (Signed)
Notified internal medicine on call of patients hgb of 7.9. No new orders.

## 2017-10-07 NOTE — Consult Note (Signed)
Referring Provider:  Dr. Precious Haws Primary Care Physician:  Einar Gip, DO Primary Gastroenterologist:  [facility]  Reason for Consultation: GI bleeding  HPI: Peggy House is a 57 y.o. female with known hereditary hemorrhagic telangiectasia, just released from hospital a few days ago following a GI bleed which appeared to be related to her known gastric AVMs, which were treated with argon plasma coagulation therapy, although she also had what appeared to be a clean-based duodenal ulcer which had been clipped on previous admission roughly 10 days earlier. Of note, the patient has been on twice-daily pantoprazole.  During her recent admission, a capsule endoscopy procedure was attempted (for the first time), but was unsuccessful because of retention of the capsule within the stomach.   The patient has not had any prodromal GI symptoms, but over the past couple of days, beginning shortly after her recent discharge, she began again having stools which were burgundy in color, and then came essentially black.  She has had several such stools since her admission.  At the time of admission, the patient's hemoglobin had fallen to 4.7, as compared to 7.6 approximately 2 days earlier.  She has remained hemodynamically stable.  No chest pain, shortness of breath,She has remained hemodynamically stable. No chest pain, shortness of breath, or syncope.     Past Medical History:  Diagnosis Date  . Anxiety   . Arthritis    knnes,back  . GERD (gastroesophageal reflux disease)   . History of swelling of feet   . Hyperlipidemia   . Hypertension   . Major depressive disorder, recurrent episode ([place]) 06/05/2015  . Obesity   . Snores     Past Surgical History:  Procedure Laterality Date  . ABDOMINAL HYSTERECTOMY    . CARPAL TUNNEL RELEASE  05/13/2011   Procedure: CARPAL TUNNEL RELEASE;  Surgeon: Nita Sells, MD;  Location: [place];  Service: Orthopedics;  Laterality: Left;   . COLONOSCOPY WITH PROPOFOL N/A 04/28/2014   Procedure: COLONOSCOPY WITH PROPOFOL;  Surgeon: Cleotis Nipper, MD;  Location: [facility] ENDOSCOPY;  Service: Endoscopy;  Laterality: N/A;  . DG TOES*L*  2/10   rt  . DILATION AND CURETTAGE OF UTERUS    . ESOPHAGOGASTRODUODENOSCOPY N/A 04/10/2014   Procedure: ESOPHAGOGASTRODUODENOSCOPY (EGD);  Surgeon: [name], MD;  Location: [facility] ENDOSCOPY;  Service: Endoscopy;  Laterality: N/A;  . ESOPHAGOGASTRODUODENOSCOPY N/A 05/10/2017   Procedure: ESOPHAGOGASTRODUODENOSCOPY (EGD);  Surgeon: Ronald Lobo, MD;  Location[facility] ENDOSCOPY;  Service: Endoscopy;  Laterality: N/A;  . ESOPHAGOGASTRODUODENOSCOPY N/A 09/22/2017   Procedure: ESOPHAGOGASTRODUODENOSCOPY (EGD);  Surgeon: Clarene Essex, MD;  Location: [place];  Service: Endoscopy;  Laterality: N/A;  bedside  . ESOPHAGOGASTRODUODENOSCOPY (EGD) WITH PROPOFOL N/A 04/27/2014   Procedure: ESOPHAGOGASTRODUODENOSCOPY (EGD) WITH PROPOFOL;  Surgeon: Cleotis Nipper, MD;  Location: [place];  Service: Endoscopy;  Laterality: N/A;  possible apc  . ESOPHAGOGASTRODUODENOSCOPY (EGD) WITH PROPOFOL N/A 09/30/2017   Procedure: ESOPHAGOGASTRODUODENOSCOPY (EGD) WITH PROPOFOL;  Surgeon: [name], MD;  Location: [place];  Service: Gastroenterology;  Laterality: N/A;  . ESOPHAGOGASTRODUODENOSCOPY (EGD) WITH PROPOFOL N/A 10/01/2017   Procedure: ESOPHAGOGASTRODUODENOSCOPY (EGD) WITH PROPOFOL;  Surgeon: [name], MD;  Location: [place];  Service: Gastroenterology;  Laterality: N/A;  . GIVENS CAPSULE STUDY N/A 10/02/2017   Procedure: GIVENS CAPSULE STUDY;  Surgeon: [name], MD;  Location: [place];  Service: Gastroenterology;  Laterality: N/A;  . HOT HEMOSTASIS N/A 04/27/2014   Procedure: HOT HEMOSTASIS (ARGON PLASMA COAGULATION/BICAP);  Surgeon: Cleotis Nipper, MD;  Location: [facility] ENDOSCOPY;  Service: Endoscopy;  Laterality: N/A;  . HOT HEMOSTASIS N/A 09/30/2017   Procedure: HOT HEMOSTASIS (ARGON PLASMA  COAGULATION/BICAP);  Surgeon: [name], MD;  Location: [place];  Service: Gastroenterology;  Laterality: N/A;  . HOT HEMOSTASIS N/A 10/01/2017   Procedure: HOT HEMOSTASIS (ARGON PLASMA COAGULATION/BICAP);  Surgeon: [name], MD;  Location: [place];  Service: Gastroenterology;  Laterality: N/A;  . L shoulder Surgery  2011  . SUBMUCOSAL INJECTION  09/22/2017   Procedure: SUBMUCOSAL INJECTION;  Surgeon: Clarene Essex, MD;  Location: [place];  Service: Endoscopy;;    Prior to Admission medications   Medication Sig Start Date End Date Taking? Authorizing Provider  acetaminophen (TYLENOL) 325 MG tablet Take 650 mg by mouth every 6 (six) hours as needed for mild pain or headache.    Yes [provider]  albuterol (PROVENTIL HFA;VENTOLIN HFA) 108 (90 Base) MCG/ACT inhaler Inhale 1-2 puffs into the lungs every 6 (six) hours as needed for wheezing or shortness of breath. 04/03/17  Yes Molt, Bethany, DO  atorvastatin (LIPITOR) 80 MG tablet take 1 tablet by mouth once daily 01/11/17  Yes Molt, Bethany, DO  metoprolol succinate (TOPROL-XL) 100 MG 24 hr tablet Take 1 tablet (100 mg total) by mouth daily. Take with or immediately following a meal. 09/09/17  Yes Molt, Bethany, DO  pantoprazole (PROTONIX) 40 MG tablet Take 1 tablet (40 mg total) by mouth 2 (two) times daily. 09/25/17  Yes Kathi Ludwig, MD  sertraline (ZOLOFT) 50 MG tablet Take 1 tablet (50 mg total) by mouth daily. 09/09/17  Yes Molt, Bethany, DO  ferrous gluconate (FERGON) 324 MG tablet Take 1 tablet (324 mg total) by mouth 3 (three) times daily with meals. Patient taking differently: Take 324 mg by mouth daily with breakfast.  12/25/16 09/29/17  Ardath Sax, MD    Current Facility-Administered Medications  Medication Dose Route Frequency Provider Last Rate Last Dose  . 0.9 %  sodium chloride infusion   Intravenous Continuous Ina Homes, MD 150 mL/hr at 10/07/17 0605    . acetaminophen (TYLENOL) tablet  650 mg  650 mg Oral Q6H PRN Ina Homes, MD       Or  . acetaminophen (TYLENOL) suppository 650 mg  650 mg Rectal Q6H PRN Helberg, Justin, MD      . albuterol (PROVENTIL) (2.5 MG/3ML) 0.083% nebulizer solution 3 mL  3 mL Inhalation Q6H PRN Helberg, Justin, MD      . atorvastatin (LIPITOR) tablet 80 mg  80 mg Oral Daily Ina Homes, MD   80 mg at 10/07/17 0952  . ondansetron (ZOFRAN) tablet 4 mg  4 mg Oral Q6H PRN Ina Homes, MD       Or  . ondansetron (ZOFRAN) injection 4 mg  4 mg Intravenous Q6H PRN Helberg, Larkin Ina, MD      . pantoprazole (PROTONIX) 80 mg in sodium chloride 0.9 % 250 mL (0.32 mg/mL) infusion  8 mg/hr Intravenous Continuous Ina Homes, MD 25 mL/hr at 10/07/17 0605 8 mg/hr at 10/07/17 0605  . sertraline (ZOLOFT) tablet 50 mg  50 mg Oral Daily Ina Homes, MD   50 mg at 10/07/17 0952  . tranexamic acid (LYSTEDA) tablet 1,300 mg  1,300 mg Oral TID Ledell Noss, MD        Allergies as of 10/06/2017 - Review Complete 10/06/2017  Allergen Reaction Noted  . Nsaids Other (See Comments) 04/25/2014  . Tomato Hives 01/01/2012  . Wasp venom Swelling 10/06/2017    Family History  Problem Relation Age of Onset  . Cancer Mother        Ovarian  . Ovarian cancer Mother   . Diabetes Mother   . Kidney disease Mother   . Bleeding Disorder Mother   . Diabetes type II Sister   . Bleeding Disorder Sister   . Diabetes Sister   . Colon cancer Maternal Grandfather   . Crohn's disease Maternal Grandfather   . Stomach cancer Maternal Grandmother   . Kidney disease Son   . Bleeding Disorder Son   . Dysmenorrhea Neg Hx     Social History   Socioeconomic History  . Marital status: Single    Spouse name: Not on file  . Number of children: Not on file  . Years of education: Not on file  . Highest education level: Not on file  Occupational History  . Not on file  Social Needs  . Financial resource strain: Not on file  . Food insecurity:    Worry: Not on file     Inability: Not on file  . Transportation needs:    Medical: Not on file    Non-medical: Not on file  Tobacco Use  . Smoking status: Current Every Day Smoker    Packs/day: 0.50    Years: 30.00    Pack years: 15.00    Types: Cigarettes  . Smokeless tobacco: Former Systems developer    Types: Snuff    Quit date: 27  . Tobacco comment: 1/2 PPD  Substance and Sexual Activity  . Alcohol use: No    Alcohol/week: 0.0 oz  . Drug use: No    Comment: last cocaine-2010  . Sexual activity: Yes    Birth control/protection: Surgical    Comment: Hysterectomy  Lifestyle  . Physical activity:    Days per week: Not on file    Minutes per session: Not on file  . Stress: Not on file  Relationships  . Social connections:    Talks on phone: Not on file    Gets together: Not on file    Attends religious service: Not on file    Active member of club or organization: Not on file    Attends meetings of clubs or organizations: Not on file    Relationship status: Not on file  . Intimate partner violence:    Fear of current or ex partner: Not on file    Emotionally abused: Not on file    Physically abused: Not on file    Forced sexual activity: Not on file  Other Topics Concern  . Not on file  Social History Narrative  . Not on file    Review of Systems: See history of present illness  Physical Exam: Vital signs in last 24 hours: Temp:  [98.4 F (36.9 C)-99.3 F (37.4 C)] 98.4 F (36.9 C) (06/25 0940) Pulse Rate:  [58-65] 58 (06/25 0940) Resp:  [10-22] 16 (06/25 0940) BP: (100-139)/(53-74) 136/70 (06/25 0940) SpO2:  [98 %-100 %] 99 % (06/25 0940) Weight:  [113.4 kg (250 lb)-121 kg (266 lb 12.1 oz)] 121 kg (266 lb 12.1 oz) (06/24 2206) Last BM Date: 10/06/17 Pleasant, overweight female lying in bed in distress African-American female lying in bed in absolutely no distress.  Anicteric. Chest clear. Heart has a 3/6 systolic murmur.  Abdomen obese but nontender.  Intake/Output from previous  day: 06/24 0701 - 06/25 0700 In: 330 [Blood:330] Out: -  Intake/Output this shift: No intake/output data recorded.  Lab Results: Recent Labs  10/06/17 1519 10/07/17 0406  WBC 17.4* 14.3*  HGB 4.7* 5.7*  HCT 16.2* 18.3*  PLT 395 326   BMET Recent Labs    10/06/17 1519 10/07/17 0406  NA 140 145  K 3.6 3.5  CL 109 115*  CO2 23 23  GLUCOSE 96 84  BUN 15 13  CREATININE 0.84 0.81  CALCIUM 7.9* 7.6*   LFT Recent Labs    10/06/17 1519  PROT 4.5*  ALBUMIN 2.1*  AST 11*  ALT 6*  ALKPHOS 52  BILITOT 0.4   PT/INR Recent Labs    10/06/17 1727  LABPROT 16.7*  INR 1.37    Studies/Results: No results found.  Impression: Recurrent GI bleeding with severe posthemorrhagic anemia superimposed on chronic anemia.  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  Plan: EGD w/ endoscopic Givens capsule placement tomorrow (not available today, pt grossly stable and appears to have stopped bleeding).  Rationale and risks of capsule study, and plans for endoscopic placement, d/w pt and she is agreeable.  She understands it will likely be Dr. Alessandra Bevels to do the procedure.   LOS: 1 day   Eveleen Mcnear V  10/07/2017, 10:28 AM   Pager [phone] If no answer or after 5 PM call [phone]

## 2017-10-07 NOTE — Progress Notes (Signed)
Initial Nutrition Assessment  DOCUMENTATION CODES:   Morbid obesity  INTERVENTION:  Provide Boost Breeze po BID, each supplement provides 250 kcal and 9 grams of protein.  Encourage PO intake.   NUTRITION DIAGNOSIS:   Inadequate oral intake related to acute illness(GI bleed) as evidenced by (clear liquid diet).  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 Presents with bright red blood per rectum.   Pt was asleep during time of visit and did not awaken to RD assessment. No family at bedside. RD unable to obtain most recent nutrition history. No weight loss per weight records. Pt is currently on a clear liquid diet. Meal completion at lunch was ~60-70%.  Plans for EGD tomorrow. RD to order nutritional supplements to aid in adequate nutrition.   NUTRITION - FOCUSED PHYSICAL EXAM:    Most Recent Value  Orbital Region  No depletion  Upper Arm Region  No depletion  Thoracic and Lumbar Region  No depletion  Buccal Region  No depletion  Temple Region  No depletion  Clavicle Bone Region  No depletion  Clavicle and Acromion Bone Region  No depletion  Scapular Bone Region  No depletion  Dorsal Hand  No depletion  Patellar Region  No depletion  Anterior Thigh Region  No depletion  Posterior Calf Region  No depletion  Edema (RD Assessment)  Mild  Hair  Unable to assess  Eyes  Unable to assess  Mouth  Unable to assess  Skin  Reviewed  Nails  Reviewed       Diet Order:   Diet Order           Diet NPO time specified Except for: Sips with Meds  Diet effective midnight        Diet clear liquid Room service appropriate?  Yes; Fluid consistency: Thin  Diet effective now          EDUCATION NEEDS:   Not appropriate for education at this time  Skin:  Skin Assessment: Reviewed RN Assessment  Last BM:  6/25  Height:   Ht Readings from Last 1 Encounters:  10/06/17 5' 5.5" (1.664 m)    Weight:   Wt Readings from Last 1 Encounters:  10/06/17 266 lb 12.1 oz (121 kg)    Ideal Body Weight:  58 kg  BMI:  Body mass index is 43.72 kg/m.  Estimated Nutritional Needs:   Kcal:  1850-2050  Protein:  100-120 grams  Fluid:  1.8 - 2.1 L/day    Corrin Parker, MS, RD, LDN Pager # [phone] After hours/ weekend pager # [phone]

## 2017-10-07 NOTE — Progress Notes (Signed)
Peggy House   DOB:[birth year]   JO#:[id number]   NOB#:[id number]  Hematology follow up  Subjective: I saw [name] in my office in 02/27/2018 for her iron deficient anemia.  She was previously seen by Dr. Lebron Conners.  This is her second admission to [facility] in a row for recurrent GI bleeding.  She has required 4 units of blood transfusion last night.  She had a 2 large bowel movement today with melena. No other bleeding.  She does have chronic recurrent epistaxis, average twice a week since childhood.  Her mother had a nosebleeding, but no other major bleeding.  Her maternal aunt also had issue with bleeding.   Objective:  Vitals:   10/07/17 1100 10/07/17 1200  BP: 138/68 (!) 145/70  Pulse: (!) 58 64  Resp: 17 19  Temp:    SpO2: 98% 100%    Body mass index is 43.72 kg/m.  Intake/Output Summary (Last 24 hours) at 10/07/2017 1634 Last data filed at 10/07/2017 0128 Gross per 24 hour  Intake 330 ml  Output -  Net 330 ml     Sclerae unicteric  Oropharynx clear, she has a few telangiectasia in her mucosa and tongue, but nothing in her fingertips and skin   No peripheral adenopathy  Lungs clear -- no rales or rhonchi  Heart regular rate and rhythm  Abdomen benign  MSK no focal spinal tenderness, no peripheral edema  Neuro nonfocal   CBG (last 3)  No results for input(s): GLUCAP in the last 72 hours.   Labs:  Lab Results  Component Value Date   WBC 14.3 (H) 10/07/2017   HGB 5.7 (LL) 10/07/2017   HCT 18.3 (L) 10/07/2017   MCV 93.4 10/07/2017   PLT 326 10/07/2017   NEUTROABS 11.1 (H) 05/20/2017   CMP Latest Ref Rng & Units 10/07/2017 10/06/2017 10/01/2017  Glucose 70 - 99 mg/dL 84 96 98  BUN 6 - 20 mg/dL 13 15 22(H)  Creatinine 0.44 - 1.00 mg/dL 0.81 0.84 0.90  Sodium 135 - 145 mmol/L 145 140 147(H)  Potassium 3.5 - 5.1 mmol/L 3.5 3.6 3.9  Chloride 98 - 111 mmol/L 115(H) 109 120(H)  CO2 22 - 32 mmol/L 23 23 23   Calcium 8.9 - 10.3 mg/dL 7.6(L) 7.9(L) 8.0(L)  Total  Protein 6.5 - 8.1 g/dL - 4.5(L) -  Total Bilirubin 0.3 - 1.2 mg/dL - 0.4 -  Alkaline Phos 38 - 126 U/L - 52 -  AST 15 - 41 U/L - 11(L) -  ALT 14 - 54 U/L - 6(L) -     Urine Studies No results for input(s): UHGB, CRYS in the last 72 hours.  Invalid input(s): UACOL, UAPR, USPG, UPH, UTP, UGL, UKET, UBIL, UNIT, UROB, ULEU, UEPI, UWBC, URBC, UBAC, CAST, [place], [place]  Basic Metabolic Panel: Recent Labs  Lab 10/01/17 0415 10/06/17 1519 10/07/17 0406  NA 147* 140 145  K 3.9 3.6 3.5  CL 120* 109 115*  CO2 23 23 23   GLUCOSE 98 96 84  BUN 22* 15 13  CREATININE 0.90 0.84 0.81  CALCIUM 8.0* 7.9* 7.6*   GFR Estimated Creatinine Clearance: 102 mL/min (by C-G formula based on SCr of 0.81 mg/dL). Liver Function Tests: Recent Labs  Lab 10/06/17 1519  AST 11*  ALT 6*  ALKPHOS 52  BILITOT 0.4  PROT 4.5*  ALBUMIN 2.1*   No results for input(s): LIPASE, AMYLASE in the last 168 hours. No results for input(s): AMMONIA in the last 168 hours. Coagulation profile  Recent Labs  Lab 10/06/17 1727  INR 1.37    CBC: Recent Labs  Lab 10/01/17 0415  10/02/17 0559 10/02/17 1917 10/03/17 1035 10/06/17 1519 10/07/17 0406  WBC 18.1*  --  16.1*  --  13.1* 17.4* 14.3*  HGB 7.8*   < > 6.9* 7.7* 7.6*  7.7* 4.7* 5.7*  HCT 24.9*   < > 22.4* 24.6* 24.8*  24.6* 16.2* 18.3*  MCV 96.5  --  99.1  --  98.8 101.9* 93.4  PLT 212  --  235  --  265 395 326   < > = values in this interval not displayed.   Cardiac Enzymes: No results for input(s): CKTOTAL, CKMB, CKMBINDEX, TROPONINI in the last 168 hours. BNP: Invalid input(s): POCBNP CBG: No results for input(s): GLUCAP in the last 168 hours. D-Dimer No results for input(s): DDIMER in the last 72 hours. Hgb A1c No results for input(s): HGBA1C in the last 72 hours. Lipid Profile No results for input(s): CHOL, HDL, LDLCALC, TRIG, CHOLHDL, LDLDIRECT in the last 72 hours. Thyroid function studies No results for input(s): TSH, T4TOTAL, T3FREE,  THYROIDAB in the last 72 hours.  Invalid input(s): FREET3 Anemia work up No results for input(s): VITAMINB12, FOLATE, FERRITIN, TIBC, IRON, RETICCTPCT in the last 72 hours. Microbiology Recent Results (from the past 240 hour(s))  MRSA PCR Screening     Status: None   Collection Time: 10/07/17  5:56 AM  Result Value Ref Range Status   MRSA by PCR NEGATIVE NEGATIVE Final    Comment:        The GeneXpert MRSA Assay (FDA approved for NASAL specimens only), is one component of a comprehensive MRSA colonization surveillance program. It is not intended to diagnose MRSA infection nor to guide or monitor treatment for MRSA infections. Performed at [facility], [place] [address], [place], [place] [address]       Studies:  No results found.  Assessment: 57 y.o. with chronic recurrent epistaxis since childhood, recurrent GI bleeding with AVMs  1. 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  Severe anemia from GI bleeding and iron deficiency 4. HTN   Plan:  -I agree with GI work-up, she is going to have a repeated EGD and capsule endoscopy tomorrow -She has received 4 units of blood since admission yesterday, will repeat hemoglobin today -Please give 1 dose IV Feraheme 510 mg I discussed benefits and potential side effects, especially allergy reaction during and after infusion, she voiced good understanding and agrees to proceed. -I discussed treatment options for HHT, including tranexamic acid, bevacizumab, and tamoxifen.  She has not had any history of venous or arterial thrombosis, so it is not contraindicated.  However tranexamic acid and bevacizumab both increases risk of thrombosis, I agree with tranexamic acid in the acute bleeding setting, and will switch her to  Tamoxifen after her acute GI bleeding stops, for long term prophylaxis of bleeding.  Potential benefits and side effects discussed with her, she agrees to try.  I plan to see her back one week after discharge, and may start her on Tamoxifen then.  -I will give her second dose Feraheme when I see her in my clinic next week -please call me if you have any questions.   Truitt Merle, MD 10/07/2017  4:34 PM

## 2017-10-07 NOTE — Progress Notes (Addendum)
   Subjective: [name] is feeling okay today. She has not had lightheadedness, chest pain, or shortness of breath since yesterday evening. She has been having about 3 bloody bowel movements daily, the last was yesterday evening.   Objective:  Vital signs in last 24 hours: Vitals:   10/07/17 0300 10/07/17 0519 10/07/17 0542 10/07/17 0557  BP: 115/61 132/66  129/60  Pulse: 60 60 (!) 59 61  Resp: 10 18 17 18   Temp: 98.7 F (37.1 C) 99.1 F (37.3 C)  98.9 F (37.2 C)  TempSrc: Oral Oral  Oral  SpO2: 100% 100% 99% 99%  Weight:      Height:       General: appears fatigued Cardiac: regular rate and rhythm, normal S1 and S2, no peripheral edema   Pulm: normal work of breathing, lungs clear to auscultation  GI: abdomen is soft, non distended, mildly tender over the epigastrum   Assessment/Plan:  Active Problems:   GI bleed  Symptomatic anemia due to GI bleeding and HHT  - presented with Hgb 4.7 and symptoms of anemia after persistent BRBPR following discharge  - Hgb improved to 5.7 after 2 units of PRBCs  - will order two additional units PRBC ordered  - follow up post transfusion H&H then CBC q6 hours  - start tranexamic acid 1300 TID  - on PPI gtt per GI  - GI planning for possible scope today. I am curious if a capsule could be placed passed the stomach during the procedure as the last capsule did not progress past the GE junction and was not diagnostic.  - GI is following, appreciate recs  - she had an apt to see Dr. Burr Medico at [place] [facility] today, I have spoken with Dr. Alvy Bimler who will notify Dr. Burr Medico that [name] has been hospitalized   Hypertension  - Hold Metoprolol XL 100 mg qd   GERD  - on protonix gtt   MDD  - continue home sertraline 50 mg qd   HLD  - continue home atorvastatin 40 mg qd   Dispo: Anticipated discharge in approximately 3-4 day(s).   Ledell Noss, MD 10/07/2017, 7:19 AM Pager: [phone]

## 2017-10-08 ENCOUNTER — Encounter (HOSPITAL_COMMUNITY)
Admission: EM | Disposition: A | Discharge status: Home Health | Payer: Self-pay | Source: Home / Self Care | Attending: Student in an Organized Health Care Education/Training Program

## 2017-10-08 ENCOUNTER — Inpatient Hospital Stay (HOSPITAL_COMMUNITY): Payer: Medicaid Other | Admitting: Certified Registered"

## 2017-10-08 ENCOUNTER — Ambulatory Visit: Payer: Medicaid Other

## 2017-10-08 ENCOUNTER — Encounter (HOSPITAL_COMMUNITY): Payer: Self-pay | Admitting: Certified Registered"

## 2017-10-08 HISTORY — PX: GIVENS CAPSULE STUDY: SHX5432

## 2017-10-08 HISTORY — PX: ESOPHAGOGASTRODUODENOSCOPY (EGD) WITH PROPOFOL: SHX5813

## 2017-10-08 LAB — CBC
HCT: 23.6 % — ABNORMAL LOW (ref 36.0–46.0)
HEMOGLOBIN: 7.4 g/dL — AB (ref 12.0–15.0)
MCH: 29.5 pg (ref 26.0–34.0)
MCHC: 31.4 g/dL (ref 30.0–36.0)
MCV: 94 fL (ref 78.0–100.0)
Platelets: 150 10*3/uL (ref 150–400)
RBC: 2.51 MIL/uL — ABNORMAL LOW (ref 3.87–5.11)
RDW: 19.1 % — ABNORMAL HIGH (ref 11.5–15.5)
WBC: 12.4 10*3/uL — ABNORMAL HIGH (ref 4.0–10.5)

## 2017-10-08 LAB — PREPARE RBC (CROSSMATCH)

## 2017-10-08 SURGERY — ESOPHAGOGASTRODUODENOSCOPY (EGD) WITH PROPOFOL
Anesthesia: Monitor Anesthesia Care

## 2017-10-08 MED ORDER — HYDROXYZINE HCL 10 MG PO TABS
10.0000 mg | ORAL_TABLET | Freq: Once | ORAL | Status: AC
  Administered 2017-10-08: 10 mg via ORAL
  Filled 2017-10-08: qty 1

## 2017-10-08 MED ORDER — PROPOFOL 500 MG/50ML IV EMUL
INTRAVENOUS | Status: DC | PRN
  Administered 2017-10-08: 100 ug/kg/min via INTRAVENOUS

## 2017-10-08 MED ORDER — SODIUM CHLORIDE 0.9 % IV SOLN
510.0000 mg | INTRAVENOUS | Status: DC
  Administered 2017-10-09: 510 mg via INTRAVENOUS
  Filled 2017-10-08 (×2): qty 17

## 2017-10-08 MED ORDER — SODIUM CHLORIDE 0.9 % IV SOLN: INTRAVENOUS | Status: DC

## 2017-10-08 MED ORDER — PROPOFOL 10 MG/ML IV BOLUS
INTRAVENOUS | Status: DC | PRN
  Administered 2017-10-08 (×2): 30 mg via INTRAVENOUS
  Administered 2017-10-08: 20 mg via INTRAVENOUS

## 2017-10-08 MED ORDER — LACTATED RINGERS IV SOLN
INTRAVENOUS | Status: AC | PRN
  Administered 2017-10-08: 1000 mL via INTRAVENOUS

## 2017-10-08 MED ORDER — LIDOCAINE 2% (20 MG/ML) 5 ML SYRINGE
INTRAMUSCULAR | Status: DC | PRN
  Administered 2017-10-08: 60 mg via INTRAVENOUS

## 2017-10-08 MED ORDER — SODIUM CHLORIDE 0.9% IV SOLUTION: Freq: Once | INTRAVENOUS | Status: DC

## 2017-10-08 SURGICAL SUPPLY — 15 items

## 2017-10-08 NOTE — Transfer of Care (Signed)
Immediate Anesthesia Transfer of Care Note  Patient: Peggy House  Procedure(s) Performed: ESOPHAGOGASTRODUODENOSCOPY (EGD) WITH PROPOFOL (N/A ) GIVENS CAPSULE STUDY (N/A )  Patient Location: Endoscopy Unit  Anesthesia Type:MAC  Level of Consciousness: drowsy  Airway & Oxygen Therapy: Patient Spontanous Breathing  Post-op Assessment: Report given to RN  Post vital signs: Reviewed and stable  Last Vitals:  Vitals Value Taken Time  BP    Temp    Pulse    Resp    SpO2      Last Pain:  Vitals:   10/08/17 1049  TempSrc: Oral  PainSc: 0-No pain         Complications: No apparent anesthesia complications

## 2017-10-08 NOTE — Discharge Summary (Signed)
Name: Peggy House MRN: [id number] DOB: 03-Apr-1961 57 y.o. PCP: Einar Gip, DO  Date of Admission: 10/06/2017  3:13 PM Date of Discharge: 10/10/17 Attending Physician: Axel Filler, *  Discharge Diagnosis:  Symptomatic Blood loss anemia HHT  GERD  Discharge Medications: Allergies as of 10/10/2017      Reactions   Feraheme [ferumoxytol]    Muscle tetany, encephalopathy, fatigue, nausea   Nsaids Other (See Comments)   Stomach bleeding episodes; Tylenol is OK   Tomato Hives   Wasp Venom Swelling      Medication List    STOP taking these medications   metoprolol succinate 100 MG 24 hr tablet Commonly known as:  TOPROL-XL     TAKE these medications   acetaminophen 325 MG tablet Commonly known as:  TYLENOL Take 650 mg by mouth every 6 (six) hours as needed for mild pain or headache.   albuterol 108 (90 Base) MCG/ACT inhaler Commonly known as:  PROVENTIL HFA;VENTOLIN HFA Inhale 1-2 puffs into the lungs every 6 (six) hours as needed for wheezing or shortness of breath.   amLODipine 10 MG tablet Commonly known as:  NORVASC Take 1 tablet (10 mg total) by mouth daily. Start taking on:  10/11/2017   atorvastatin 80 MG tablet Commonly known as:  LIPITOR take 1 tablet by mouth once daily   ferrous gluconate 324 MG tablet Commonly known as:  FERGON Take 1 tablet (324 mg total) by mouth 3 (three) times daily with meals. What changed:  when to take this   pantoprazole 40 MG tablet Commonly known as:  PROTONIX Take 1 tablet (40 mg total) by mouth 2 (two) times daily.   sertraline 50 MG tablet Commonly known as:  ZOLOFT Take 1 tablet (50 mg total) by mouth daily.   tranexamic acid 650 MG Tabs tablet Commonly known as:  LYSTEDA Take 2 tablets (1,300 mg total) by mouth 3 (three) times daily for 7 days.            Durable Medical Equipment  (From admission, onward)        Start     Ordered   10/10/17 1418  For home use only DME Walker rolling   [facility])  Once    Question Answer Comment  Patient needs a walker to treat with the following condition Hereditary hemorrhagic telangiectasia ([place])   Patient needs a walker to treat with the following condition Gait instability      10/10/17 1418   10/10/17 1412  For home use only DME Walker rolling  Once    Question:  Patient needs a walker to treat with the following condition  Answer:  Generalized weakness   10/10/17 1412      Disposition and follow-up:   [name] was discharged from [facility] in good condition.  At the hospital follow up visit please address:  Symptomatic Blood loss anemia HHT  GERD  -hgb stable at 9 on discharge  -ensure pt follows up with Dr. Burr Medico Hematology, Dr. Cristina Gong GI -check for further bleeding, check cbc -consider referral to ENT for recurrent epistaxis  Adverse reaction to feraheme  -avoid feraheme as pt had severe reaction causing tetany, confusion, nausea   Hypertension    - pt bradycardic despite holding metoprolol and has no indication other than bp that I can see, started amlodipine 10mg  continue to titrate   2.  Labs / imaging needed at time of follow-up: cbc  3.  Pending labs/ test needing  follow-up: none  Follow-up Appointments: Follow-up Information    Buccini, Robert, MD. Schedule an appointment as soon as possible for a visit in 6 week(s).   Specialty:  Gastroenterology Why:  Recurrent GI bleed from AVMs. Contact information: [address] [place] [address] [phone]        Truitt Merle, MD Follow up in 5 day(s).   Specialties:  Hematology, Oncology Why:  You have an appointment with Dr. Burr Medico on July 3rd at 10:30 am Contact information: [address] [place] [address] [phone]        Molt, Romelle Starcher, DO Follow up in 2 week(s).   Specialty:  Internal Medicine Why:  you have an appointment on July 12th at 10:45am Contact information: [place] [address] [phone]           Hospital Course by problem list: Symptomatic Blood loss anemia HHT  GERD  Patient was admitted with another episode of her recurrent severe GI blood loss which she was again symptomatic from.  She is predisposed to this due to her HHT.  She took her full course of tranexamic acid but it was a very short course of only 2 days.  After it ran out she began having 2-3 large bowel movements with blood.  On arrival her hemoglobin was once again extremely low at 4.7.  Patient was given several transfusions, restarted on tranexamic acid.  GI was consulted.  GI found no areas of active bleeding on endoscopy.  They also dropped a capsule endoscope which showed several avms in the small bowel but no active bleeding sites.  The patient had epistaxis during the procedure that was trickling down that did not allow for optimal visualization.  The patient responded well to treatment her hgb stabilized and her melena slowed at first and then ceased.  We attempted to give her a dose of IV iron as well, unfortunately she had a severe reaction (see above) to the feraheme and it was discontinued immediately.  We consulted hematology who saw the patient and recommended continuing the tranexamic acid until her outpatient visit one week after discharge.  At this time she will transition the patient to tamoxifen.  The patient was stabilized, her hgb was 9.0 on discharge, she was ambulating with no shortness of breath and optimistic about the future.  She has close follow up scheduled with her PCP, hematologist and GI physician.     Discharge Vitals:   BP (!) 158/70   Pulse 89   Temp 98.6 F (37 C) (Oral)   Resp 14   Ht 5' 5.5" (1.664 m)   Wt 266 lb (120.7 kg)   SpO2 95%   BMI 43.59 kg/m   Pertinent Labs, Studies, and Procedures:  CBC Latest Ref Rng & Units 10/10/2017 10/09/2017 10/08/2017  WBC 4.0 - 10.5 K/uL 9.7 10.8(H) 12.4(H)  Hemoglobin 12.0 - 15.0 g/dL 9.0(L)  8.6(L) 7.4(L)  Hematocrit 36.0 - 46.0 % 29.5(L) 27.7(L) 23.6(L)  Platelets 150 - 400 K/uL 328 334 150   BMP Latest Ref Rng & Units 10/10/2017 10/09/2017 10/07/2017  Glucose 70 - 99 mg/dL 98 92 84  BUN 6 - 20 mg/dL <5(L) <5(L) 13  Creatinine 0.44 - 1.00 mg/dL 0.73 0.77 0.81  BUN/Creat Ratio 9 - 23 - - -  Sodium 135 - 145 mmol/L 144 145 145  Potassium 3.5 - 5.1 mmol/L 3.6 3.2(L) 3.5  Chloride 98 - 111 mmol/L 113(H) 116(H) 115(H)  CO2 22 -  32 mmol/L 26 21(L) 23  Calcium 8.9 - 10.3 mg/dL 8.2(L) 7.7(L) 7.6(L)   Please see endoscopy and pill endoscopy report in epic  Discharge Instructions: Discharge Instructions    Call MD for:   Complete by:  As directed    Call MD for:  difficulty breathing, headache or visual disturbances   Complete by:  As directed    Call MD for:  extreme fatigue   Complete by:  As directed    Call MD for:  hives   Complete by:  As directed    Call MD for:  persistant dizziness or light-headedness   Complete by:  As directed    Call MD for:  persistant nausea and vomiting   Complete by:  As directed    Call MD for:  redness, tenderness, or signs of infection (pain, swelling, redness, odor or green/yellow discharge around incision site)   Complete by:  As directed    Call MD for:  severe uncontrolled pain   Complete by:  As directed    Call MD for:  temperature >100.4   Complete by:  As directed    Diet - low sodium heart healthy   Complete by:  As directed    Discharge instructions   Complete by:  As directed    [name], you are doing very well.  Please go to the pharmacy first thing after discharge and pick up your medications.  You will take the tranexamic acid for one week until your appointment with Dr. Burr Medico, please do not miss that appointment.  Additionally you will need to pick up your protonix you will take 40mg  twice daily.  If you notice your bowel movements become bloody or black and tarry and more frequent seek medical attention right away.  Our  clinic has an after hours pager if our office is not open.  We have started you on a different blood pressure medicine.  This can be titrated in your pcp's office.  We will discharge you with some physical therapy and a nurse to check on you during this period after your discharge.   Increase activity slowly   Complete by:  As directed       Signed: Katherine Roan, MD 10/10/2017, 2:19 PM

## 2017-10-08 NOTE — Anesthesia Postprocedure Evaluation (Signed)
Anesthesia Post Note  Patient: [name]  Procedure(s) Performed: ESOPHAGOGASTRODUODENOSCOPY (EGD) WITH PROPOFOL (N/A ) GIVENS CAPSULE STUDY (N/A )     Patient location during evaluation: PACU Anesthesia Type: MAC Level of consciousness: awake and alert Pain management: pain level controlled Vital Signs Assessment: post-procedure vital signs reviewed and stable Respiratory status: spontaneous breathing, nonlabored ventilation, respiratory function stable and patient connected to nasal cannula oxygen Cardiovascular status: stable and blood pressure returned to baseline Postop Assessment: no apparent nausea or vomiting Anesthetic complications: no    Last Vitals:  Vitals:   10/08/17 1210 10/08/17 1220  BP: (!) 126/58 137/63  Pulse: (!) 59 (!) 56  Resp: 17 17  Temp: 36.9 C   SpO2: 99% 100%    Last Pain:  Vitals:   10/08/17 1220  TempSrc:   PainSc: 0-No pain                 Erica Osuna S

## 2017-10-08 NOTE — Anesthesia Procedure Notes (Signed)
Procedure Name: MAC Date/Time: 10/08/2017 11:25 AM Performed by: Barrington Ellison, CRNA Pre-anesthesia Checklist: Patient identified, Emergency Drugs available, Suction available, Patient being monitored and Timeout performed Patient Re-evaluated:Patient Re-evaluated prior to induction Oxygen Delivery Method: Nasal cannula

## 2017-10-08 NOTE — Progress Notes (Signed)
Peggy House is a 57 y.o. female has presented with recurrent GI bleeding.  The various methods of treatment have been discussed with the patient and family. After consideration of risks, benefits and other options for treatment, the patient has consented to  Procedure(s):EGD with placement of a Givens capsule. as a surgical intervention .  The patient's history has been reviewed, patient examined, no change in status, stable for surgery.  I have reviewed the patient's chart and labs.  Questions were answered to the patient's satisfaction.   Risks (bleeding, infection, bowel perforation that could require surgery, sedation-related changes in cardiopulmonary systems), benefits (identification and possible treatment of source of symptoms, exclusion of certain causes of symptoms), and alternatives (watchful waiting, radiographic imaging studies, empiric medical treatment)  were explained to patient  in detail and patient wishes to proceed.risk of capsule retention also discussed with the patient. She verbalized understanding.  Otis Brace MD, [place] 10/08/2017, 11:33 AM  Contact #  [phone]

## 2017-10-08 NOTE — Progress Notes (Signed)
   Subjective: [name] is feeling the same or better than yesterday. She did not have any bowel movements overnight but has had one this morning. She is ready for EGD today and did not eat anything overnight. She has not had chest pain, abdominal pain, or shortness of breath.   Objective:  Vital signs in last 24 hours: Vitals:   10/07/17 1937 10/07/17 2317 10/08/17 0329 10/08/17 0732  BP:    (!) 151/69  Pulse:    (!) 58  Resp:    16  Temp: 98.7 F (37.1 C) 98.4 F (36.9 C) 98.7 F (37.1 C) 98.7 F (37.1 C)  TempSrc: Axillary Oral Oral Oral  SpO2:    96%  Weight:      Height:       General: no acute distress  Cardiac: regular rate and rhythm, 3/6 loudest over left sternal border, no peripheral edema  Pulm: normal work of breathing, not on supplemental oxygen, distant breath sounds but clear to auscultation over anterior lung fields  GI: abdomen is soft, non distended   Assessment/Plan:  Principal Problem:   Acute blood loss anemia Active Problems:   GI bleed   Hereditary hemorrhagic telangiectasia ([facility])  Symptomatic anemia due to GI bleeding and HHT  Presented with Hgb 4.7 and symptoms of anemia and visible melena and BRBPR.  - Hgb improved to 7.4 post 4 units of PRBCs, she had an additional bowel movement this morning  - will give one additional unit of RBCs as she is still losing blood  - will give one dose of feraheme today  - follow up CBC tomorrow morning  - start tranexamic acid 1300 TID  - on Protonox gtt  - GI planning for scope and pill cam placement today, we appreciate recommendations  - if the EGD does not give results which require something otherwise, can resume soft diet post procedure  - Dr. Burr Medico with hematology following, we appreciate recommendations   Hypertension  - Hold Metoprolol XL 100 mg qd   GERD  - on protonix gtt   MDD  - continue home sertraline 50 mg qd   HLD  - continue home atorvastatin 40 mg qd   Dispo: Anticipated  discharge in approximately 2-3 day(s).   Ledell Noss, MD 10/08/2017, 10:09 AM Pager: [phone]

## 2017-10-08 NOTE — Progress Notes (Signed)
Givens capsule deployed using upper endoscopy scope at 1153am. Pt educated and verbalized understanding prior to EGD.

## 2017-10-08 NOTE — Anesthesia Preprocedure Evaluation (Addendum)
Anesthesia Evaluation  Patient identified by MRN, date of birth, ID band Patient awake    Reviewed: Allergy & Precautions, NPO status , Patient's Chart, lab work & pertinent test results  Airway Mallampati: II  TM Distance: >3 FB Neck ROM: Full    Dental  (+) Poor Dentition, Chipped, Missing, Loose, Dental Advisory Given,    Pulmonary Current Smoker,    Pulmonary exam normal breath sounds clear to auscultation       Cardiovascular hypertension, Normal cardiovascular exam Rhythm:Regular Rate:Normal     Neuro/Psych negative neurological ROS  negative psych ROS   GI/Hepatic negative GI ROS, Neg liver ROS,   Endo/Other  Morbid obesity  Renal/GU negative Renal ROS  negative genitourinary   Musculoskeletal negative musculoskeletal ROS (+)   Abdominal   Peds negative pediatric ROS (+)  Hematology  (+) anemia ,   Anesthesia Other Findings   Reproductive/Obstetrics negative OB ROS                            Anesthesia Physical Anesthesia Plan  ASA: III  Anesthesia Plan: MAC   Post-op Pain Management:    Induction: Intravenous  PONV Risk Score and Plan: 0  Airway Management Planned: Simple Face Mask  Additional Equipment:   Intra-op Plan:   Post-operative Plan:   Informed Consent: I have reviewed the patients History and Physical, chart, labs and discussed the procedure including the risks, benefits and alternatives for the proposed anesthesia with the patient or authorized representative who has indicated his/her understanding and acceptance.   Dental advisory given  Plan Discussed with: CRNA and Surgeon  Anesthesia Plan Comments:         Anesthesia Quick Evaluation

## 2017-10-08 NOTE — Op Note (Signed)
[facility] Patient Name: Peggy House Procedure Date : 10/08/2017 MRN: [id number] Attending MD: Otis Brace , MD Date of Birth: June 17, 1960 CSN: [id number] Age: 57 Admit Type: Inpatient Procedure:                Upper GI endoscopy Indications:              Suspected upper gastrointestinal bleeding,                            Suspected upper gastrointestinal bleeding in                            patient with chronic blood loss Providers:                Otis Brace, MD, Angus Seller, Tinnie Gens,                            Technician, Sampson Si, CRNA Referring MD:              Medicines:                Sedation Administered by an Anesthesia Professional Complications:            No immediate complications. Estimated Blood Loss:     Estimated blood loss was minimal. Procedure:                Pre-Anesthesia Assessment:                           - Prior to the procedure, a History and Physical                            was performed, and patient medications and                            allergies were reviewed. The patient's tolerance of                            previous anesthesia was also reviewed. The risks                            and benefits of the procedure and the sedation                            options and risks were discussed with the patient.                            All questions were answered, and informed consent                            was obtained. Prior Anticoagulants: The patient has                            taken no previous anticoagulant or antiplatelet  agents. ASA Grade Assessment: III - A patient with                            severe systemic disease. After reviewing the risks                            and benefits, the patient was deemed in                            satisfactory condition to undergo the procedure.                           After obtaining informed consent, the endoscope was                             passed under direct vision. Throughout the                            procedure, the patient's blood pressure, pulse, and                            oxygen saturations were monitored continuously. The                            EG-[id number] ([id number]) scope was introduced through the                            mouth, and advanced to the second part of duodenum.                            The upper GI endoscopy was accomplished without                            difficulty. The patient tolerated the procedure                            well. Scope In: Scope Out: Findings:      She was having active nose bleeding prior to starting endoscopy.      The Z-line was regular and was found 39 cm from the incisors.      The exam of the esophagus was otherwise normal.      Normal mucosa was found in the entire examined stomach.      There is no endoscopic evidence of bleeding or ulceration in the entire       examined stomach.      previously placed endoclips were seen in the duodenal bulb. No evidence       of ulcer, bleeding or AVMs into the duodenal bulb, first and second part       of the duodenum.      The examined duodenum was normal. Using the endoscope, the video capsule       enteroscope was advanced into the second portion of the duodenum. Impression:               - Z-line regular, 39 cm from the incisors.                           -  Normal mucosa was found in the entire stomach.                           - Normal examined duodenum.                           - Successful completion of the Video Capsule                            Enteroscope placement.                           - No specimens collected. Recommendation:           - Return patient to hospital ward for ongoing care.                           - Clear liquid diet. Monitor H and H                           - Continue present medications. Procedure Code(s):        --- Professional ---                            [phone], Esophagogastroduodenoscopy, flexible,                            transoral; diagnostic, including collection of                            specimen(s) by brushing or washing, when performed                            (separate procedure) Diagnosis Code(s):        --- Professional ---                           R58, Hemorrhage, not elsewhere classified CPT copyright 2017 American Medical Association. All rights reserved. The codes documented in this report are preliminary and upon coder review may  be revised to meet current compliance requirements. Otis Brace, MD Otis Brace, MD 10/08/2017 12:07:07 PM Number of Addenda: 0

## 2017-10-09 DIAGNOSIS — R040 Epistaxis: Secondary | ICD-10-CM

## 2017-10-09 LAB — BASIC METABOLIC PANEL
Anion gap: 8 (ref 5–15)
CO2: 21 mmol/L — ABNORMAL LOW (ref 22–32)
Calcium: 7.7 mg/dL — ABNORMAL LOW (ref 8.9–10.3)
Chloride: 116 mmol/L — ABNORMAL HIGH (ref 98–111)
Creatinine, Ser: 0.77 mg/dL (ref 0.44–1.00)
GFR calc Af Amer: >60 mL/min (ref >60)
GLUCOSE: 92 mg/dL (ref 70–99)
POTASSIUM: 3.2 mmol/L — AB (ref 3.5–5.1)
Sodium: 145 mmol/L (ref 135–145)

## 2017-10-09 LAB — CBC
HCT: 27.7 % — ABNORMAL LOW (ref 36.0–46.0)
Hemoglobin: 8.6 g/dL — ABNORMAL LOW (ref 12.0–15.0)
MCH: 27.7 pg (ref 26.0–34.0)
MCHC: 31 g/dL (ref 30.0–36.0)
MCV: 89.4 fL (ref 78.0–100.0)
PLATELETS: 334 10*3/uL (ref 150–400)
RBC: 3.1 MIL/uL — AB (ref 3.87–5.11)
RDW: 19.2 % — AB (ref 11.5–15.5)
WBC: 10.8 10*3/uL — ABNORMAL HIGH (ref 4.0–10.5)

## 2017-10-09 LAB — MAGNESIUM: Magnesium: 1.9 mg/dL (ref 1.7–2.4)

## 2017-10-09 MED ORDER — AMLODIPINE BESYLATE 10 MG PO TABS
10.0000 mg | ORAL_TABLET | Freq: Every day | ORAL | Status: DC
  Administered 2017-10-09 – 2017-10-10 (×2): 10 mg via ORAL
  Filled 2017-10-09 (×2): qty 1

## 2017-10-09 MED ORDER — POTASSIUM CHLORIDE 2 MEQ/ML IV SOLN
INTRAVENOUS | Status: AC
  Administered 2017-10-09: 16:00:00 via INTRAVENOUS
  Filled 2017-10-09: qty 500

## 2017-10-09 MED ORDER — POTASSIUM CHLORIDE 2 MEQ/ML IV SOLN: INTRAVENOUS | Status: DC

## 2017-10-09 NOTE — Progress Notes (Signed)
Called to bedside as pt had a reaction to feraheme infusion.  On my assessment reaction had resolved but nurse reported severe reaction where pt not able to communicate, arching back.  She removed iron and pt normalized.  BP never dropped, pt denies shortness of breath, CTAB on exam, no increased work of breathing.  Pt answering my questions appropriately reports some nausea and feels fatigued, denies chest pain or shortness of breath.  Checked bp in room 160/80.  O2 saturations 98% on 2L.

## 2017-10-09 NOTE — Progress Notes (Signed)
[facility] Gastroenterology Progress Note  Peggy House 57 y.o. 1960/09/26  CC:  Recurrent GI bleed   Subjective: she denied any further bleeding episode. Currently having brown color stool. Denied nausea or vomiting. Denied abdominal pain.  ROS : negative for chest pain and shortness of breath.   Objective: Vital signs in last 24 hours: Vitals:   10/09/17 0359 10/09/17 0827  BP:  (!) 170/79  Pulse:  61  Resp:  11  Temp: 98.7 F (37.1 C) 97.9 F (36.6 C)  SpO2:  96%    Physical Exam:  General:  Alert, cooperative, no distress, appears stated age  Head:  Normocephalic, without obvious abnormality, atraumatic  Eyes:  , EOM's intact,   Lungs:   Clear to auscultation bilaterally, respirations unlabored  Heart:  Regular rate and rhythm, murmur present  Abdomen:   Soft, non-tender, bowel sounds present, nondistended, no peritoneal signs          Lab Results: Recent Labs    10/07/17 0406 10/09/17 0310  NA 145 145  K 3.5 3.2*  CL 115* 116*  CO2 23 21*  GLUCOSE 84 92  BUN 13 <5*  CREATININE 0.81 0.77  CALCIUM 7.6* 7.7*  MG  --  1.9   Recent Labs    10/06/17 1519  AST 11*  ALT 6*  ALKPHOS 52  BILITOT 0.4  PROT 4.5*  ALBUMIN 2.1*   Recent Labs    10/08/17 0240 10/09/17 0310  WBC 12.4* 10.8*  HGB 7.4* 8.6*  HCT 23.6* 27.7*  MCV 94.0 89.4  PLT 150 334   Recent Labs    10/06/17 1727  LABPROT 16.7*  INR 1.37      Assessment/Plan: - recurrent GI bleed. Multiple EGDs in the past. Recent EGD showed AVMs and duodenal ulcer. - Acute blood loss anemia. Hemoglobin improved after blood transfusion. - recurrent epistaxis  Recommendations -------------------------- - Capsule endoscopy yesterday showed rapid transit of capsule through the small bowel within 2 hours. Patient was having active nosebleed and there was some blood seen trickling down from nasopharynx into the esophagus and subsequently into the small bowel. During capsule endoscopy, there was  evidence of fresh blood at 1 hour and 24 minutes which I believe was trickled down blood from her active epistaxis at the time of procedure. I did not see any evidence of bleeding proximal to or distal to that area. 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  - Recommend advancing diet to full liquid.   - Repeat CBC in the morning. If hemoglobin stable, hopefully discharge home tomorrow. If she continues to have a drop in hemoglobin, recommend Ocean enteroscopy as well as repeat capsule endoscopy study because current capsule study results were limited/compromised because of ongoing nosebleed.   Otis Brace MD, [place] 10/09/2017, 11:53 AM  Contact #  [phone]

## 2017-10-09 NOTE — Progress Notes (Signed)
Beginning infusing Ferum (27ml) patient states "wo I don't feel right" then her eyes rolled back and she arched her back, stopped breathing for a few seconds. Rubbed her chest, Suctioned her mouth, placed oxygen 2L .[place] on and called out patient's name then she responded.  Called to Dr Evette Doffing and stopped infusing.

## 2017-10-09 NOTE — Progress Notes (Signed)
   Subjective: Patient reports feeling well today, denies abdominal pain.  Had a bowel movement last night and says she passed the capsule easily.  Eating breakfast this morning, feeling back to normal.  Exam:   Vitals:   10/09/17 0827 10/09/17 1156  BP: (!) 170/79 (!) 174/75  Pulse: 61 71  Resp: 11 (!) 21  Temp: 97.9 F (36.6 C) 99.1 F (37.3 C)  SpO2: 96% 98%   General: Well-appearing woman lying in bed in no distress. Abdomen: Soft and nontender, nondistended Extremities: Warm and well-perfused with no edema.  Assessment and plan:  57 year old woman here with acute blood loss anemia due to GI bleeding and HHT.   Acute blood loss anemia: Almost certainly a GI bleeding source given history of gastric telangiectasias and underlying HHT.  Bleeding seems to have slowed nicely back on tranexamic acid.  Greatly appreciate GI consultation, EGD clear yesterday.  Capsule essentially clear, some residual blood but question if this is related to her recurrent epistaxis.  I agree with the plan to monitor overnight, recheck hemoglobin in the morning.    Hypertension:  Currently uncontrolled, started amlodipine.  We will monitor her response, blood pressure has a lot of variability and readings, making me wonder about accuracy of reading.  Disposition: If hemoglobin is stable tomorrow can discharge with close follow-up in hematology clinic for follow-up lab work and discussion about medical management of HHT.   Axel Filler, MD 10/09/2017, 2:56 PM

## 2017-10-09 NOTE — Progress Notes (Addendum)
   Subjective: Peggy House is feeling better today than yesterday 2 bowel movements one last night and one this am both were brown not black, no visible blood.  She denies any abdominal pain, SOB or chest pain.    Objective:  Vital signs in last 24 hours: Vitals:   10/09/17 0000 10/09/17 0359 10/09/17 0827 10/09/17 1156  BP: (!) 159/75  (!) 170/79 (!) 174/75  Pulse: 63  61 71  Resp: (!) 22  11 (!) 21  Temp:  98.7 F (37.1 C) 97.9 F (36.6 C) 99.1 F (37.3 C)  TempSrc:  Oral Oral Oral  SpO2: 96%  96% 98%  Weight:      Height:       Physical Exam  Constitutional: No distress.  Cardiovascular: Normal rate and regular rhythm. Exam reveals no gallop and no friction rub.  Murmur (3/6 systolic) heard. Pulmonary/Chest: Effort normal and breath sounds normal. No respiratory distress. She has no wheezes. She has no rales. She exhibits no tenderness.  Abdominal: Soft. Bowel sounds are normal. She exhibits no distension and no mass. There is no tenderness. There is no rebound and no guarding.  Neurological: She is alert.  Skin: She is not diaphoretic.   CBC Latest Ref Rng & Units 10/09/2017 10/08/2017 10/07/2017  WBC 4.0 - 10.5 K/uL 10.8(H) 12.4(H) -  Hemoglobin 12.0 - 15.0 g/dL 8.6(L) 7.4(L) 7.9(L)  Hematocrit 36.0 - 46.0 % 27.7(L) 23.6(L) 25.1(L)  Platelets 150 - 400 K/uL 334 150 -    Assessment/Plan:  Principal Problem:   Acute blood loss anemia Active Problems:   GI bleed   Hereditary hemorrhagic telangiectasia (HCC)  Symptomatic anemia due to GI bleeding and HHT  Presented with Hgb 4.7 and symptoms of anemia and visible melena and BRBPR.   -had some epistaxis during endoscopy procedure yesterday, no active bleeding overnight, hgb stable this am, is scheduled for some IV iron  -capsule retrieved from endoscopy did not show any active bleeds other than epistaxis which compromised some of the views, some small avms seen in proximal small bowel  Hypertension   - started  amlodipine 10mg  today  GERD   - on protonix gtt   MDD   - continue home sertraline 50 mg qd   HLD   - continue home atorvastatin 40 mg qd   Dispo: Anticipated discharge home tomorrow if hgb stable  Katherine Roan, MD 10/09/2017, 3:55 PM Vickki Muff MD PGY-1 Internal Medicine Pager # [phone]

## 2017-10-10 ENCOUNTER — Telehealth: Payer: Self-pay | Admitting: Internal Medicine

## 2017-10-10 DIAGNOSIS — Z888 Allergy status to other drugs, medicaments and biological substances status: Secondary | ICD-10-CM

## 2017-10-10 DIAGNOSIS — T454X5A Adverse effect of iron and its compounds, initial encounter: Secondary | ICD-10-CM

## 2017-10-10 LAB — TYPE AND SCREEN
ABO/RH(D): A NEG
ANTIBODY SCREEN: NEGATIVE
UNIT DIVISION: 0
Unit division: 0
Unit division: 0
Unit division: 0
Unit division: 0
Unit division: 0

## 2017-10-10 LAB — BPAM RBC
BLOOD PRODUCT EXPIRATION DATE: 201907052359
BLOOD PRODUCT EXPIRATION DATE: 201907062359
BLOOD PRODUCT EXPIRATION DATE: 201907062359
BLOOD PRODUCT EXPIRATION DATE: 201907072359
Blood Product Expiration Date: 201907062359
Blood Product Expiration Date: 201907062359
ISSUE DATE / TIME: 201906241744
ISSUE DATE / TIME: 201906242238
ISSUE DATE / TIME: 201906250527
ISSUE DATE / TIME: 201906250905
ISSUE DATE / TIME: 201906261721
UNIT TYPE AND RH: 600
UNIT TYPE AND RH: 600
Unit Type and Rh: 600
Unit Type and Rh: 600
Unit Type and Rh: 600
Unit Type and Rh: 600

## 2017-10-10 LAB — CBC
HEMATOCRIT: 29.5 % — AB (ref 36.0–46.0)
Hemoglobin: 9 g/dL — ABNORMAL LOW (ref 12.0–15.0)
MCH: 27.3 pg (ref 26.0–34.0)
MCHC: 30.5 g/dL (ref 30.0–36.0)
MCV: 89.4 fL (ref 78.0–100.0)
Platelets: 328 10*3/uL (ref 150–400)
RBC: 3.3 MIL/uL — ABNORMAL LOW (ref 3.87–5.11)
RDW: 18.8 % — AB (ref 11.5–15.5)
WBC: 9.7 10*3/uL (ref 4.0–10.5)

## 2017-10-10 LAB — BASIC METABOLIC PANEL
Anion gap: 5 (ref 5–15)
CALCIUM: 8.2 mg/dL — AB (ref 8.9–10.3)
CO2: 26 mmol/L (ref 22–32)
CREATININE: 0.73 mg/dL (ref 0.44–1.00)
Chloride: 113 mmol/L — ABNORMAL HIGH (ref 98–111)
GFR calc non Af Amer: >60 mL/min (ref >60)
Glucose, Bld: 98 mg/dL (ref 70–99)
Potassium: 3.6 mmol/L (ref 3.5–5.1)
Sodium: 144 mmol/L (ref 135–145)

## 2017-10-10 MED ORDER — PANTOPRAZOLE SODIUM 40 MG PO TBEC: 40.0000 mg | DELAYED_RELEASE_TABLET | Freq: Two times a day (BID) | ORAL | 0 refills | Status: DC

## 2017-10-10 MED ORDER — PANTOPRAZOLE SODIUM 40 MG PO TBEC
40.0000 mg | DELAYED_RELEASE_TABLET | Freq: Two times a day (BID) | ORAL | Status: DC
  Administered 2017-10-10: 40 mg via ORAL
  Filled 2017-10-10: qty 1

## 2017-10-10 MED ORDER — TRANEXAMIC ACID 650 MG PO TABS: 1300.0000 mg | ORAL_TABLET | Freq: Three times a day (TID) | ORAL | 0 refills | Status: DC

## 2017-10-10 MED ORDER — AMLODIPINE BESYLATE 10 MG PO TABS: 10.0000 mg | ORAL_TABLET | Freq: Every day | ORAL | 0 refills | Status: DC

## 2017-10-10 NOTE — Progress Notes (Signed)
[facility] Gastroenterology Progress Note  Peggy House 57 y.o. Aug 23, 1960  CC:  Recurrent GI bleed   Subjective: no acute events overnight.she denied any further bleeding episode.  Denied nausea or vomiting. Denied abdominal pain.  ROS : negative for chest pain and shortness of breath.   Objective: Vital signs in last 24 hours: Vitals:   10/10/17 0400 10/10/17 0907  BP:    Pulse:    Resp:    Temp: 98.6 F (37 C) 98.5 F (36.9 C)  SpO2:      Physical Exam:  General:  Alert, cooperative, no distress, appears stated age  Head:  Normocephalic, without obvious abnormality, atraumatic  Eyes:  , EOM's intact,   Lungs:   Clear to auscultation bilaterally, respirations unlabored  Heart:  Regular rate and rhythm, murmur present  Abdomen:   Soft, non-tender, bowel sounds present, nondistended, no peritoneal signs          Lab Results: Recent Labs    10/09/17 0310 10/10/17 0745  NA 145 144  K 3.2* 3.6  CL 116* 113*  CO2 21* 26  GLUCOSE 92 98  BUN <5* <5*  CREATININE 0.77 0.73  CALCIUM 7.7* 8.2*  MG 1.9  --    No results for input(s): AST, ALT, ALKPHOS, BILITOT, PROT, ALBUMIN in the last 72 hours. Recent Labs    10/09/17 0310 10/10/17 0256  WBC 10.8* 9.7  HGB 8.6* 9.0*  HCT 27.7* 29.5*  MCV 89.4 89.4  PLT 334 328   No results for input(s): LABPROT, INR in the last 72 hours.    Assessment/Plan: - recurrent GI bleed. Multiple EGDs in the past. Recent EGD showed AVMs and duodenal ulcer. - Acute blood loss anemia. Hemoglobin improved after blood transfusion. - recurrent epistaxis  Recommendations -------------------------- - hemoglobin remained stable. - advance diet to soft diet - Okay to discharge home from GI standpoint. - Follow with primary GI Dr. Cristina Gong in 6 to 8 weeks after discharge.  - Recommend outpatient ENT evaluation for recurrent epistaxis. - GI will sign off. Call us back if needed   - Capsule endoscopy 06/26  showed rapid transit of  capsule through the small bowel within 2 hours. Patient was having active nosebleed and there was some blood seen trickling down from nasopharynx into the esophagus and subsequently into the small bowel. During capsule endoscopy, there was evidence of fresh blood at 1 hour and 24 minutes (most likely distal jejunum/proximal ileum)  which I believe was trickled down blood from her active epistaxis at the time of procedure. I did not see any evidence of bleeding proximal to or distal to that area. She also had few tiny  AVMs in the proximal small bowel.    Otis Brace MD, [place] 10/10/2017, 9:55 AM  Contact #  [phone]

## 2017-10-10 NOTE — Progress Notes (Signed)
   Subjective: [name] is feeling well this morning, no bowel movements overnight, no epistaxis.    Objective:  Vital signs in last 24 hours: Vitals:   10/09/17 1600 10/09/17 2000 10/09/17 2300 10/10/17 0400  BP: (!) 159/74 (!) 149/51 (!) 158/75   Pulse: 72 63 60   Resp: (!) 21 17 20    Temp: 99.2 F (37.3 C) 98.9 F (37.2 C) 98.8 F (37.1 C) 98.6 F (37 C)  TempSrc: Oral Oral Oral Oral  SpO2: 97% 98% 98%   Weight:      Height:       Physical Exam  Constitutional: No distress.  Cardiovascular: Normal rate and regular rhythm. Exam reveals no gallop and no friction rub.  Murmur (3/6 systolic) heard. Pulmonary/Chest: Effort normal and breath sounds normal. No respiratory distress. She has no wheezes. She has no rales. She exhibits no tenderness.  Abdominal: Soft. Bowel sounds are normal. She exhibits no distension and no mass. There is no tenderness. There is no rebound and no guarding.  Neurological: She is alert.  Skin: She is not diaphoretic.   CBC Latest Ref Rng & Units 10/10/2017 10/09/2017 10/08/2017  WBC 4.0 - 10.5 K/uL 9.7 10.8(H) 12.4(H)  Hemoglobin 12.0 - 15.0 g/dL 9.0(L) 8.6(L) 7.4(L)  Hematocrit 36.0 - 46.0 % 29.5(L) 27.7(L) 23.6(L)  Platelets 150 - 400 K/uL 328 334 150    Assessment/Plan:  Principal Problem:   Acute blood loss anemia Active Problems:   GI bleed   Hereditary hemorrhagic telangiectasia (HCC)  Symptomatic anemia due to GI bleeding and HHT  Presented with Hgb 4.7 and symptoms of anemia and visible melena and BRBPR.   -hgb stable at 9 this am, no GI blood loss in last 48 hours, some epistaxis during endoscopy -safe for discharge home today has hematology follow up next week, will follow up with GI   Hypertension   - started amlodipine 10mg  yesterday, bp better this am, further titration in outpatient   GERD   - on protonix gtt   MDD   - continue home sertraline 50 mg qd   HLD   - continue home atorvastatin 40 mg qd   Dispo:  Anticipated discharge home this afternoon  Katherine Roan, MD 10/10/2017, 7:56 AM Vickki Muff MD PGY-1 Internal Medicine Pager # [phone]

## 2017-10-10 NOTE — Evaluation (Signed)
Physical Therapy Evaluation Patient Details Name: Peggy House MRN: [id number] DOB: [birth year] Today's Date: 10/10/2017   History of Present Illness  57 yo admitted with anemia due to GIB. PMHx: Hereditary Hemorrhagic Telangiectasias, HLD, HTN, anxiety, arthritis  Clinical Impression  Pt very pleasant and wanting to move. Pt reports she hasn't really walked since admission and was a little unsteady getting onto her feet and initiating gait but improved significantly with use of RW. Pt with decreased gait and balance who will benefit from acute therapy to maximize independence to return to PLOF. Recommend RW for gait currently and assist for stairs at home. Pt encouraged to walk daily and develop a walking program for home.   HR 89 SpO2 95% on RA    Follow Up Recommendations No PT follow up    Equipment Recommendations  Rolling walker with 5" wheels    Recommendations for Other Services       Precautions / Restrictions Precautions Precautions: Fall Restrictions Weight Bearing Restrictions: No      Mobility  Bed Mobility Overal bed mobility: Modified Independent                Transfers Overall transfer level: Modified independent                  Ambulation/Gait Ambulation/Gait assistance: Supervision Gait Distance (Feet): 200 Feet Assistive device: Rolling walker (2 wheeled) Gait Pattern/deviations: Step-through pattern;Decreased stride length   Gait velocity interpretation: >2.62 ft/sec, indicative of community ambulatory General Gait Details: pt initiated gait without AD but reaching to environmental support throughout room and unsteady and very cautious without UE support, after 10' utilized RW with good stability and speed  Stairs Stairs: Yes Stairs assistance: Min guard Stair Management: Step to pattern;Forwards;No rails;With walker Number of Stairs: 3 General stair comments: pt with one hand on wall and other with folded RW as pt does not have  railing and states someone will not always be with her  Wheelchair Mobility    Modified Rankin (Stroke Patients Only)       Balance Overall balance assessment: History of Falls                                           Pertinent Vitals/Pain Pain Assessment: 0-10 Pain Score: 3  Pain Location: left abdomen Pain Descriptors / Indicators: Sore Pain Intervention(s): Limited activity within patient's tolerance;Repositioned;Monitored during session    Home Living Family/patient expects to be discharged to:: Private residence Living Arrangements: Spouse/significant other Available Help at Discharge: Family;Available PRN/intermittently Type of Home: House Home Access: Stairs to enter   [employer] of Steps: 3 Home Layout: One level Home Equipment: None      Prior Function Level of Independence: Independent               Hand Dominance        Extremity/Trunk Assessment   Upper Extremity Assessment Upper Extremity Assessment: Overall WFL for tasks assessed    Lower Extremity Assessment Lower Extremity Assessment: Overall WFL for tasks assessed    Cervical / Trunk Assessment Cervical / Trunk Assessment: Other exceptions Cervical / Trunk Exceptions: rounded shoulders  Communication   Communication: No difficulties  Cognition Arousal/Alertness: Awake/alert Behavior During Therapy: WFL for tasks assessed/performed Overall Cognitive Status: Within Functional Limits for tasks assessed  General Comments General comments (skin integrity, edema, etc.): 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    Exercises     Assessment/Plan    PT Assessment Patient needs continued PT services  PT Problem List Decreased activity tolerance;Decreased balance;Decreased knowledge of use of DME       PT Treatment Interventions DME instruction;Therapeutic activities;Gait  training;Patient/family education;Balance training;Functional mobility training;Stair training    PT Goals (Current goals can be found in the Care Plan section)  Acute Rehab PT Goals Patient Stated Goal: return home and cook PT Goal Formulation: With patient Time For Goal Achievement: 10/24/17 Potential to Achieve Goals: Good    Frequency Min 3X/week   Barriers to discharge Decreased caregiver support      Co-evaluation               AM-PAC PT "6 Clicks" Daily Activity  Outcome Measure Difficulty turning over in bed (including adjusting bedclothes, sheets and blankets)?: None Difficulty moving from lying on back to sitting on the side of the bed? : None Difficulty sitting down on and standing up from a chair with arms (e.g., wheelchair, bedside commode, etc,.)?: A Little Help needed moving to and from a bed to chair (including a wheelchair)?: A Little Help needed walking in hospital room?: A Little Help needed climbing 3-5 steps with a railing? : A Little 6 Click Score: 20    End of Session Equipment Utilized During Treatment: Gait belt Activity Tolerance: Patient tolerated treatment well Patient left: in chair;with call bell/phone within reach Nurse Communication: Mobility status PT Visit Diagnosis: Other abnormalities of gait and mobility (R26.89)    Time: 2633-3545 PT Time Calculation (min) (ACUTE ONLY): 14 min   Charges:   PT Evaluation $PT Eval Low Complexity: 1 Low     PT G Codes:        Elwyn Reach, PT [phone]   Jolayne Branson B Makenzi Bannister 10/10/2017, 2:19 PM

## 2017-10-10 NOTE — Telephone Encounter (Signed)
Hospital f/u per Dr Shan Levans; pt appt 07/12/NW

## 2017-10-10 NOTE — Care Management Note (Signed)
Case Management Note  Patient Details  Name: Peggy House MRN: [id number] Date of Birth: 1960/04/24  Subjective/Objective:   57 yr old female admitted with a GI bleed.       Action/Plan: Referral for Home Health services called to Peggy House, [facility] Liaison.   Expected Discharge Date:  10/10/17               Expected Discharge Plan:  [place]  In-House Referral:  NA  Discharge planning Services  CM Consult  Post Acute Care Choice:  Home Health, Durable Medical Equipment Choice offered to:  Patient  DME Arranged:  [facility] rolling DME Agency:  [place] Arranged:  RN, PT [facility] Agency:  [place]  Status of Service:  Completed, signed off  If discussed at [place] of Stay Meetings, dates discussed:    Additional Comments:  Ninfa Meeker, RN 10/10/2017, 2:14 PM

## 2017-10-10 NOTE — Progress Notes (Signed)
Discharge instructions, RX's and follow up appts explained and provided to patient verbalized understanding. Home Health set up for patient, walker delivered to room contact information provided. Patient left floor via wheelchair accompanied by staff no c/o pain or shortness of breath at d/c.    Cecily Lawhorne, Tivis Ringer, RN

## 2017-10-10 NOTE — Plan of Care (Signed)
  Problem: Health Behavior/Discharge Planning: Goal: Ability to manage health-related needs will improve Outcome: Completed/Met   Problem: Clinical Measurements: Goal: Ability to maintain clinical measurements within normal limits will improve Outcome: Completed/Met Goal: Will remain free from infection Outcome: Completed/Met Goal: Diagnostic test results will improve Outcome: Completed/Met Goal: Respiratory complications will improve Outcome: Completed/Met Goal: Cardiovascular complication will be avoided Outcome: Completed/Met   Problem: Activity: Goal: Risk for activity intolerance will decrease Outcome: Completed/Met   Problem: Nutrition: Goal: Adequate nutrition will be maintained Outcome: Completed/Met   Problem: Coping: Goal: Level of anxiety will decrease Outcome: Completed/Met   Problem: Elimination: Goal: Will not experience complications related to bowel motility Outcome: Completed/Met Goal: Will not experience complications related to urinary retention Outcome: Completed/Met   Problem: Pain Managment: Goal: General experience of comfort will improve Outcome: Completed/Met   Problem: Safety: Goal: Ability to remain free from injury will improve Outcome: Completed/Met   Problem: Skin Integrity: Goal: Risk for impaired skin integrity will decrease Outcome: Completed/Met

## 2017-10-15 ENCOUNTER — Other Ambulatory Visit: Payer: Self-pay

## 2017-10-15 ENCOUNTER — Telehealth: Payer: Self-pay | Admitting: Nurse Practitioner

## 2017-10-15 ENCOUNTER — Inpatient Hospital Stay (HOSPITAL_COMMUNITY)
Admission: AD | Admit: 2017-10-15 | Discharge: 2017-10-22 | DRG: 300 | Disposition: A | Discharge status: Home Health | Payer: Medicaid Other | Source: Ambulatory Visit | Attending: Internal Medicine | Admitting: Internal Medicine

## 2017-10-15 ENCOUNTER — Inpatient Hospital Stay (HOSPITAL_BASED_OUTPATIENT_CLINIC_OR_DEPARTMENT_OTHER): Payer: Medicaid Other | Admitting: Nurse Practitioner

## 2017-10-15 ENCOUNTER — Encounter (HOSPITAL_COMMUNITY): Payer: Self-pay | Admitting: General Practice

## 2017-10-15 ENCOUNTER — Inpatient Hospital Stay: Payer: Medicaid Other | Attending: Hematology

## 2017-10-15 ENCOUNTER — Encounter: Payer: Self-pay | Admitting: Nurse Practitioner

## 2017-10-15 VITALS — BP 126/67 | HR 93 | Temp 98.4°F | Resp 18 | Ht 65.5 in | Wt 257.7 lb

## 2017-10-15 DIAGNOSIS — K31811 Angiodysplasia of stomach and duodenum with bleeding: Secondary | ICD-10-CM | POA: Diagnosis not present

## 2017-10-15 DIAGNOSIS — K921 Melena: Secondary | ICD-10-CM | POA: Diagnosis not present

## 2017-10-15 DIAGNOSIS — Z832 Family history of diseases of the blood and blood-forming organs and certain disorders involving the immune mechanism: Secondary | ICD-10-CM | POA: Diagnosis not present

## 2017-10-15 DIAGNOSIS — D473 Essential (hemorrhagic) thrombocythemia: Secondary | ICD-10-CM

## 2017-10-15 DIAGNOSIS — K219 Gastro-esophageal reflux disease without esophagitis: Secondary | ICD-10-CM | POA: Diagnosis present

## 2017-10-15 DIAGNOSIS — Q2733 Arteriovenous malformation of digestive system vessel: Secondary | ICD-10-CM | POA: Diagnosis not present

## 2017-10-15 DIAGNOSIS — D72828 Other elevated white blood cell count: Secondary | ICD-10-CM | POA: Diagnosis not present

## 2017-10-15 DIAGNOSIS — F17210 Nicotine dependence, cigarettes, uncomplicated: Secondary | ICD-10-CM

## 2017-10-15 DIAGNOSIS — Z91018 Allergy to other foods: Secondary | ICD-10-CM

## 2017-10-15 DIAGNOSIS — Z8719 Personal history of other diseases of the digestive system: Secondary | ICD-10-CM

## 2017-10-15 DIAGNOSIS — Z79810 Long term (current) use of selective estrogen receptor modulators (SERMs): Secondary | ICD-10-CM | POA: Diagnosis not present

## 2017-10-15 DIAGNOSIS — I2721 Secondary pulmonary arterial hypertension: Secondary | ICD-10-CM | POA: Diagnosis not present

## 2017-10-15 DIAGNOSIS — D500 Iron deficiency anemia secondary to blood loss (chronic): Secondary | ICD-10-CM | POA: Diagnosis not present

## 2017-10-15 DIAGNOSIS — K449 Diaphragmatic hernia without obstruction or gangrene: Secondary | ICD-10-CM | POA: Diagnosis not present

## 2017-10-15 DIAGNOSIS — I959 Hypotension, unspecified: Secondary | ICD-10-CM | POA: Diagnosis present

## 2017-10-15 DIAGNOSIS — Z6841 Body Mass Index (BMI) 40.0 and over, adult: Secondary | ICD-10-CM

## 2017-10-15 DIAGNOSIS — Z8711 Personal history of peptic ulcer disease: Secondary | ICD-10-CM

## 2017-10-15 DIAGNOSIS — Z91030 Bee allergy status: Secondary | ICD-10-CM | POA: Diagnosis not present

## 2017-10-15 DIAGNOSIS — M1990 Unspecified osteoarthritis, unspecified site: Secondary | ICD-10-CM

## 2017-10-15 DIAGNOSIS — Z886 Allergy status to analgesic agent status: Secondary | ICD-10-CM

## 2017-10-15 DIAGNOSIS — I780 Hereditary hemorrhagic telangiectasia: Principal | ICD-10-CM | POA: Diagnosis present

## 2017-10-15 DIAGNOSIS — D509 Iron deficiency anemia, unspecified: Secondary | ICD-10-CM | POA: Diagnosis present

## 2017-10-15 DIAGNOSIS — F339 Major depressive disorder, recurrent, unspecified: Secondary | ICD-10-CM | POA: Diagnosis not present

## 2017-10-15 DIAGNOSIS — R040 Epistaxis: Secondary | ICD-10-CM

## 2017-10-15 DIAGNOSIS — K922 Gastrointestinal hemorrhage, unspecified: Secondary | ICD-10-CM | POA: Insufficient documentation

## 2017-10-15 DIAGNOSIS — F329 Major depressive disorder, single episode, unspecified: Secondary | ICD-10-CM | POA: Diagnosis present

## 2017-10-15 DIAGNOSIS — K5521 Angiodysplasia of colon with hemorrhage: Secondary | ICD-10-CM | POA: Diagnosis not present

## 2017-10-15 DIAGNOSIS — Z79899 Other long term (current) drug therapy: Secondary | ICD-10-CM | POA: Diagnosis not present

## 2017-10-15 DIAGNOSIS — Z8481 Family history of carrier of genetic disease: Secondary | ICD-10-CM | POA: Diagnosis not present

## 2017-10-15 DIAGNOSIS — Z91128 Patient's intentional underdosing of medication regimen for other reason: Secondary | ICD-10-CM

## 2017-10-15 DIAGNOSIS — R011 Cardiac murmur, unspecified: Secondary | ICD-10-CM

## 2017-10-15 DIAGNOSIS — D62 Acute posthemorrhagic anemia: Secondary | ICD-10-CM | POA: Diagnosis not present

## 2017-10-15 DIAGNOSIS — I10 Essential (primary) hypertension: Secondary | ICD-10-CM

## 2017-10-15 DIAGNOSIS — T454X6A Underdosing of iron and its compounds, initial encounter: Secondary | ICD-10-CM | POA: Diagnosis present

## 2017-10-15 DIAGNOSIS — K920 Hematemesis: Secondary | ICD-10-CM | POA: Diagnosis not present

## 2017-10-15 DIAGNOSIS — I361 Nonrheumatic tricuspid (valve) insufficiency: Secondary | ICD-10-CM | POA: Diagnosis not present

## 2017-10-15 DIAGNOSIS — Z888 Allergy status to other drugs, medicaments and biological substances status: Secondary | ICD-10-CM

## 2017-10-15 DIAGNOSIS — E785 Hyperlipidemia, unspecified: Secondary | ICD-10-CM

## 2017-10-15 DIAGNOSIS — D638 Anemia in other chronic diseases classified elsewhere: Secondary | ICD-10-CM

## 2017-10-15 DIAGNOSIS — Z9889 Other specified postprocedural states: Secondary | ICD-10-CM

## 2017-10-15 DIAGNOSIS — Z91038 Other insect allergy status: Secondary | ICD-10-CM

## 2017-10-15 LAB — CBC
HCT: 12.8 % — ABNORMAL LOW (ref 36.0–46.0)
Hemoglobin: 3.7 g/dL — CL (ref 12.0–15.0)
MCH: 27 pg (ref 26.0–34.0)
MCHC: 28.9 g/dL — AB (ref 30.0–36.0)
MCV: 93.4 fL (ref 78.0–100.0)
Platelets: 206 10*3/uL (ref 150–400)
RBC: 1.37 MIL/uL — AB (ref 3.87–5.11)
RDW: 17.2 % — AB (ref 11.5–15.5)
WBC: 9.9 10*3/uL (ref 4.0–10.5)

## 2017-10-15 LAB — IRON AND TIBC
Iron: 10 ug/dL — ABNORMAL LOW (ref 41–142)
SATURATION RATIOS: 4 % — AB (ref 21–57)
TIBC: 229 ug/dL — ABNORMAL LOW (ref 236–444)
UIBC: 219 ug/dL

## 2017-10-15 LAB — CBC WITH DIFFERENTIAL (CANCER CENTER ONLY)
BASOS ABS: 0.1 10*3/uL (ref 0.0–0.1)
BASOS PCT: 1 %
EOS ABS: 0.1 10*3/uL (ref 0.0–0.5)
Eosinophils Relative: 1 %
HCT: 17.7 % — ABNORMAL LOW (ref 34.8–46.6)
Hemoglobin: 5.6 g/dL — CL (ref 11.6–15.9)
Lymphocytes Relative: 13 %
Lymphs Abs: 1.6 10*3/uL (ref 0.9–3.3)
MCH: 26.8 pg (ref 25.1–34.0)
MCHC: 31.5 g/dL (ref 31.5–36.0)
MCV: 85.3 fL (ref 79.5–101.0)
Monocytes Absolute: 0.6 10*3/uL (ref 0.1–0.9)
Monocytes Relative: 5 %
NEUTROS PCT: 80 %
Neutro Abs: 9.5 10*3/uL — ABNORMAL HIGH (ref 1.5–6.5)
PLATELETS: 332 10*3/uL (ref 145–400)
RBC: 2.08 MIL/uL — AB (ref 3.70–5.45)
RDW: 17.6 % — AB (ref 11.2–14.5)
WBC: 11.9 10*3/uL — AB (ref 3.9–10.3)

## 2017-10-15 LAB — RETICULOCYTES
RBC.: 2.07 MIL/uL — ABNORMAL LOW (ref 3.87–5.11)
RETIC COUNT ABSOLUTE: 178 10*3/uL (ref 19.0–186.0)
RETIC CT PCT: 8.6 % — AB (ref 0.4–3.1)

## 2017-10-15 LAB — PREPARE RBC (CROSSMATCH)

## 2017-10-15 LAB — FERRITIN: FERRITIN: 61 ng/mL (ref 11–307)

## 2017-10-15 MED ORDER — TAMOXIFEN CITRATE 20 MG PO TABS: 20.0000 mg | ORAL_TABLET | Freq: Every day | ORAL | 3 refills | Status: DC

## 2017-10-15 MED ORDER — FERROUS GLUCONATE 324 (38 FE) MG PO TABS: 324.0000 mg | ORAL_TABLET | Freq: Every day | ORAL | Status: DC

## 2017-10-15 MED ORDER — SODIUM CHLORIDE 0.9% IV SOLUTION: Freq: Once | INTRAVENOUS | Status: DC

## 2017-10-15 MED ORDER — SERTRALINE HCL 50 MG PO TABS
50.0000 mg | ORAL_TABLET | Freq: Every day | ORAL | Status: DC
  Administered 2017-10-16 – 2017-10-22 (×7): 50 mg via ORAL
  Filled 2017-10-15 (×7): qty 1

## 2017-10-15 MED ORDER — ALBUTEROL SULFATE (2.5 MG/3ML) 0.083% IN NEBU: 2.5000 mg | INHALATION_SOLUTION | Freq: Four times a day (QID) | RESPIRATORY_TRACT | Status: DC | PRN

## 2017-10-15 MED ORDER — PANTOPRAZOLE SODIUM 40 MG IV SOLR
40.0000 mg | Freq: Two times a day (BID) | INTRAVENOUS | Status: DC
  Administered 2017-10-15 – 2017-10-22 (×14): 40 mg via INTRAVENOUS
  Filled 2017-10-15 (×16): qty 40

## 2017-10-15 MED ORDER — SODIUM CHLORIDE 0.9 % IV SOLN
INTRAVENOUS | Status: DC
  Administered 2017-10-15: 22:00:00 via INTRAVENOUS

## 2017-10-15 MED ORDER — PANTOPRAZOLE SODIUM 40 MG PO TBEC: 40.0000 mg | DELAYED_RELEASE_TABLET | Freq: Two times a day (BID) | ORAL | Status: DC

## 2017-10-15 MED ORDER — NICOTINE 14 MG/24HR TD PT24
14.0000 mg | MEDICATED_PATCH | Freq: Every day | TRANSDERMAL | Status: DC
  Administered 2017-10-15 – 2017-10-22 (×8): 14 mg via TRANSDERMAL
  Filled 2017-10-15 (×8): qty 1

## 2017-10-15 MED ORDER — ATORVASTATIN CALCIUM 80 MG PO TABS
80.0000 mg | ORAL_TABLET | Freq: Every day | ORAL | Status: DC
  Administered 2017-10-16 – 2017-10-22 (×7): 80 mg via ORAL
  Filled 2017-10-15 (×7): qty 1

## 2017-10-15 MED ORDER — TAMOXIFEN CITRATE 20 MG PO TABS: 20.0000 mg | ORAL_TABLET | Freq: Every day | ORAL | Status: DC

## 2017-10-15 MED ORDER — FERROUS GLUCONATE 324 (38 FE) MG PO TABS
324.0000 mg | ORAL_TABLET | Freq: Three times a day (TID) | ORAL | Status: DC
  Administered 2017-10-16 – 2017-10-22 (×17): 324 mg via ORAL
  Filled 2017-10-15 (×13): qty 1

## 2017-10-15 MED ORDER — SODIUM CHLORIDE 0.9 % IV BOLUS
1000.0000 mL | Freq: Once | INTRAVENOUS | Status: AC
  Administered 2017-10-15: 1000 mL via INTRAVENOUS

## 2017-10-15 MED ORDER — TRANEXAMIC ACID 650 MG PO TABS: 1300.0000 mg | ORAL_TABLET | Freq: Three times a day (TID) | ORAL | Status: DC

## 2017-10-15 MED ORDER — AMINOCAPROIC ACID 500 MG PO TABS
1.5000 g | ORAL_TABLET | Freq: Two times a day (BID) | ORAL | Status: DC
  Administered 2017-10-15: 1.5 g via ORAL
  Filled 2017-10-15 (×3): qty 3

## 2017-10-15 NOTE — Progress Notes (Signed)
Spoke with Dawn in [employer], requested Med/Surg Bed per Dr. Burr Medico, accepting physician Dr. Eppie Gibson.  She was given the patient's cell phone number to call when bed assigned.

## 2017-10-15 NOTE — Progress Notes (Addendum)
[facility]  [phone] [phone]  Clinic Follow up Note   Patient Care Team: Einar Gip, DO as PCP - General (Internal Medicine) 10/15/2017   INTERVAL HISTORY: [name] returns for f/u chronic anemia. She was last seen 02/2017. She was hospitalized recently from 6/9 - 6/13 for upper GI bleed with ulceration presenting as hematemesis.  Upper endoscopy per Dr. Watt Climes revealed blood/coffee-ground material in the lesser curvature of the stomach and one oozing duodenal ulcer. She was given 1 dose of Feraheme. Hgb was 8.4 at discharge. She was readmitted from clinic on 6/17 with worsening anemia at hospital f/u visit. 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 Repeat upper endoscopy 6/19 showed multiple non bleeding AVMs. She was discharged 6/21 after 6 units RBCs with Hgb 7.7. She was readmitted 10/06/17 with hematemesis and melena, Hgb 4.7. EGD on 6/26 was fairly unremarkable. Capsule endoscopy showed evidence of fresh blood felt to be related to active epistaxis at time of the exam without evidence of bleeding proximal or distal to that, she had a few small AVMs in the proximal small bowel. She was given IV Feraheme on 6/27 but developed eyes rolling back and she arched her back, stopped breathing for a few seconds per RN note. Discharged on 6/28 with Hgb 9.0.   Today she reports having red and black stool, 4 per day x 3 days. Stools were brown over the weekend. She has tiredness and dyspnea on exertion, denies chest pain, palpitations, pre-syncope, lightheadedness or dizziness. She has hospital f/u with GI Dr. Cristina Gong on 10/24/17. She has not restarted taking oral iron, she does not think it is helping. She is taking tranexamic acid TID.   REVIEW OF SYSTEMS:   Constitutional: Denies fevers, chills or abnormal weight loss (+) fatigue  Eyes: Denies  blurriness of vision Ears, nose, mouth, throat, and face: Denies mucositis, epistaxis, or sore throat Respiratory: Denies cough or wheezes (+) DOE Cardiovascular: Denies palpitation, chest discomfort or lower extremity swelling Gastrointestinal:  Denies nausea, heartburn or change in bowel habits (+) red and black stool 4 episodes /day x3 days  Skin: Denies abnormal skin rashes Lymphatics: Denies new lymphadenopathy or easy bruising Neurological:Denies numbness, tingling or new weaknesses Behavioral/Psych: Mood is stable, no new changes  All other systems were reviewed with the patient and are negative.  MEDICAL HISTORY:  Past Medical History:  Diagnosis Date  . Anxiety   . Arthritis    knnes,back  . GERD (gastroesophageal reflux disease)   . History of swelling of feet   . Hyperlipidemia   . Hypertension   . Major depressive disorder, recurrent episode ([place]) 06/05/2015  . Obesity   . Snores     SURGICAL HISTORY: Past Surgical History:  Procedure Laterality Date  . ABDOMINAL HYSTERECTOMY    . CARPAL TUNNEL RELEASE  05/13/2011   Procedure: CARPAL TUNNEL RELEASE;  Surgeon: Nita Sells, MD;  Location: [place];  Service: Orthopedics;  Laterality: Left;  . COLONOSCOPY WITH PROPOFOL N/A 04/28/2014   Procedure: COLONOSCOPY WITH PROPOFOL;  Surgeon: Cleotis Nipper, MD;  Location: [facility] ENDOSCOPY;  Service: Endoscopy;  Laterality: N/A;  . DG TOES*L*  2/10   rt  . DILATION AND CURETTAGE OF UTERUS    . ESOPHAGOGASTRODUODENOSCOPY N/A 04/10/2014   Procedure: ESOPHAGOGASTRODUODENOSCOPY (EGD);  Surgeon: [name], MD;  Location: [facility] ENDOSCOPY;  Service: Endoscopy;  Laterality: N/A;  .  ESOPHAGOGASTRODUODENOSCOPY N/A 05/10/2017   Procedure: ESOPHAGOGASTRODUODENOSCOPY (EGD);  Surgeon: Ronald Lobo, MD;  Location: [facility] ENDOSCOPY;  Service: Endoscopy;  Laterality: N/A;  . ESOPHAGOGASTRODUODENOSCOPY N/A 09/22/2017   Procedure: ESOPHAGOGASTRODUODENOSCOPY (EGD);   Surgeon: Clarene Essex, MD;  Location: [place];  Service: Endoscopy;  Laterality: N/A;  bedside  . ESOPHAGOGASTRODUODENOSCOPY (EGD) WITH PROPOFOL N/A 04/27/2014   Procedure: ESOPHAGOGASTRODUODENOSCOPY (EGD) WITH PROPOFOL;  Surgeon: Cleotis Nipper, MD;  Location: [place];  Service: Endoscopy;  Laterality: N/A;  possible apc  . ESOPHAGOGASTRODUODENOSCOPY (EGD) WITH PROPOFOL N/A 09/30/2017   Procedure: ESOPHAGOGASTRODUODENOSCOPY (EGD) WITH PROPOFOL;  Surgeon: [name], MD;  Location: [place];  Service: Gastroenterology;  Laterality: N/A;  . ESOPHAGOGASTRODUODENOSCOPY (EGD) WITH PROPOFOL N/A 10/01/2017   Procedure: ESOPHAGOGASTRODUODENOSCOPY (EGD) WITH PROPOFOL;  Surgeon: [name], MD;  Location: [place];  Service: Gastroenterology;  Laterality: N/A;  . ESOPHAGOGASTRODUODENOSCOPY (EGD) WITH PROPOFOL N/A 10/08/2017   Procedure: ESOPHAGOGASTRODUODENOSCOPY (EGD) WITH PROPOFOL;  Surgeon: Otis Brace, MD;  Location: [facility] ENDOSCOPY;  Service: Gastroenterology;  Laterality: N/A;  . GIVENS CAPSULE STUDY N/A 10/02/2017   Procedure: GIVENS CAPSULE STUDY;  Surgeon: [name], MD;  Location: [place];  Service: Gastroenterology;  Laterality: N/A;  . GIVENS CAPSULE STUDY N/A 10/08/2017   Procedure: GIVENS CAPSULE STUDY;  Surgeon: Otis Brace, MD;  Location: [place];  Service: Gastroenterology;  Laterality: N/A;  endoscopic placement of capsule  . HOT HEMOSTASIS N/A 04/27/2014   Procedure: HOT HEMOSTASIS (ARGON PLASMA COAGULATION/BICAP);  Surgeon: Cleotis Nipper, MD;  Location: [facility] ENDOSCOPY;  Service: Endoscopy;  Laterality: N/A;  . HOT HEMOSTASIS N/A 09/30/2017   Procedure: HOT HEMOSTASIS (ARGON PLASMA COAGULATION/BICAP);  Surgeon: [name], MD;  Location: [place];  Service: Gastroenterology;  Laterality: N/A;  . HOT HEMOSTASIS N/A 10/01/2017   Procedure: HOT HEMOSTASIS (ARGON PLASMA COAGULATION/BICAP);  Surgeon: [name], MD;  Location: [place];  Service:  Gastroenterology;  Laterality: N/A;  . L shoulder Surgery  2011  . SUBMUCOSAL INJECTION  09/22/2017   Procedure: SUBMUCOSAL INJECTION;  Surgeon: Clarene Essex, MD;  Location: [facility] ENDOSCOPY;  Service: Endoscopy;;    I have reviewed the social history and family history with the patient and they are unchanged from previous note.  ALLERGIES:  is allergic to feraheme [ferumoxytol]; nsaids; tomato; and wasp venom.  MEDICATIONS:  Current Outpatient Medications  Medication Sig Dispense Refill  . acetaminophen (TYLENOL) 325 MG tablet Take 650 mg by mouth every 6 (six) hours as needed for mild pain or headache.     . albuterol (PROVENTIL HFA;VENTOLIN HFA) 108 (90 Base) MCG/ACT inhaler Inhale 1-2 puffs into the lungs every 6 (six) hours as needed for wheezing or shortness of breath. 1 Inhaler 3  . amLODipine (NORVASC) 10 MG tablet Take 1 tablet (10 mg total) by mouth daily. 30 tablet 0  . atorvastatin (LIPITOR) 80 MG tablet take 1 tablet by mouth once daily 30 tablet 11  . pantoprazole (PROTONIX) 40 MG tablet Take 1 tablet (40 mg total) by mouth 2 (two) times daily. 180 tablet 0  . sertraline (ZOLOFT) 50 MG tablet Take 1 tablet (50 mg total) by mouth daily. 30 tablet 1  . tranexamic acid (LYSTEDA) 650 MG TABS tablet Take 2 tablets (1,300 mg total) by mouth 3 (three) times daily for 7 days. 42 tablet 0  . ferrous gluconate (FERGON) 324 MG tablet Take 1 tablet (324 mg total) by mouth 3 (three) times daily with meals. (Patient taking differently: Take 324 mg by mouth daily with breakfast. ) 90 tablet 0  .  tamoxifen (NOLVADEX) 20 MG tablet Take 1 tablet (20 mg total) by mouth daily. 30 tablet 3   No current facility-administered medications for this visit.     PHYSICAL EXAMINATION: ECOG PERFORMANCE STATUS: 1 - Symptomatic but completely ambulatory  Vitals:   10/15/17 1146  BP: 126/67  Pulse: 93  Resp: 18  Temp: 98.4 F (36.9 C)  SpO2: 97%   Filed Weights   10/15/17 1146  Weight: 257 lb 11.2 oz  (116.9 kg)    GENERAL:alert, no distress and comfortable SKIN: no rashes or significant lesions EYES: normal, Conjunctiva are pink and non-injected, sclera clear OROPHARYNX: no thrush or ulcers. Multiple tiny scattered abnormal capillaries on tongue and oral mucosa  LUNGS: clear to auscultation with normal breathing effort HEART: regular rate & rhythm with murmur, no lower extremity edema ABDOMEN:abdomen soft, non-tender and normal bowel sounds. Obese.  Musculoskeletal:no cyanosis of digits and no clubbing  NEURO: alert & oriented x 3 with fluent speech, no focal motor/sensory deficits  LABORATORY DATA:  I have reviewed the data as listed CBC Latest Ref Rng & Units 10/15/2017 10/10/2017 10/09/2017  WBC 3.9 - 10.3 K/uL 11.9(H) 9.7 10.8(H)  Hemoglobin 11.6 - 15.9 g/dL 5.6(LL) 9.0(L) 8.6(L)  Hematocrit 34.8 - 46.6 % 17.7(L) 29.5(L) 27.7(L)  Platelets 145 - 400 K/uL 332 328 334     CMP Latest Ref Rng & Units 10/10/2017 10/09/2017 10/07/2017  Glucose 70 - 99 mg/dL 98 92 84  BUN 6 - 20 mg/dL <5(L) <5(L) 13  Creatinine 0.44 - 1.00 mg/dL 0.73 0.77 0.81  Sodium 135 - 145 mmol/L 144 145 145  Potassium 3.5 - 5.1 mmol/L 3.6 3.2(L) 3.5  Chloride 98 - 111 mmol/L 113(H) 116(H) 115(H)  CO2 22 - 32 mmol/L 26 21(L) 23  Calcium 8.9 - 10.3 mg/dL 8.2(L) 7.7(L) 7.6(L)  Total Protein 6.5 - 8.1 g/dL - - -  Total Bilirubin 0.3 - 1.2 mg/dL - - -  Alkaline Phos 38 - 126 U/L - - -  AST 15 - 41 U/L - - -  ALT 14 - 54 U/L - - -      RADIOGRAPHIC STUDIES: I have personally reviewed the radiological images as listed and agreed with the findings in the report. No results found.   ASSESSMENT & PLAN: DESARIE FEILD is a 57 y.o. African-American female with a history of GERD, HLD, HTN and obesity.   1. Iron Deficient Anemia 2. Reactive leukocytosis and reactive thrombocytosis 3. Chronic epistaxis, h/o recurrent GI bleeding  4. Smoking cessation 5. Health maintenance, cancer screening   Ms. Isley appears  stable. 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 She is severely anemic today with Hgb 5.6 and active GI bleeding. GI cannot perform scope today on short notice, due to the holiday the soonest outpatient appointment is 7/9. The patient was seen with Dr. Burr Medico who recommends admission to [facility] for further GI intervention and rapid stabilization of her Hgb. Ferritin is low normal range, serum iron is low, 10. Given her questionable reaction to IV Feraheme in the hospital will hold additional IV Feraheme for now. I recommend she restart Fergon TID. She is currently on tranexamic acid for GI bleeds, Dr. Burr Medico recommends to switch her therapy to tamoxifen daily to help decrease bleeding. She reviewed side effects in detail, Rx sent to pharmacy. Will follow patient's hospital course and see her outpatient for ongoing management.  PLAN:  -Labs reviewed  -Patient to be admitted to [facility] for GI bleed and severe anemia  -Restart oral iron TID -Stop tranexamic acid -Start tamoxifen, Rx sent   All questions were answered. The patient knows to call the clinic with any problems, questions or concerns. No barriers to learning was detected.     Alla Feeling, NP 10/15/17   Addendum  I have seen the patient, examined her. I agree with the assessment and and plan and have edited the notes.   [name] developed recurrent GI bleeding with bloody stool 3-5 times a day since 3 days ago, and her Hg dropped to 5.6 today, she has mild dyspnea on exertion but no chest pain or other symptoms. She has not responded well to tranexamic acid. I will change it to Amicar 1.5g bid. I recommend hospital admission for severe anemia and persistent GI bleeding. I spoke with [facility] IM teaching service resident. I will see her in the hospital. I spoke with GI Dr. Penelope Coop also and he will f/u in hospital.   Truitt Merle  10/15/2017

## 2017-10-15 NOTE — Progress Notes (Signed)
CRITICAL VALUE ALERT  Critical Value:  Hgb 3.7  Date & Time Notied:  10/15/17 at 2135  Provider Notified: Trude Mcburney with IMTS  Orders Received/Actions taken: Returned page and placed order for another unit of blood in addition to the 2 units of blood already ordered. MD also said they were consulting GI at this time for possible intervention throughout the night. Will continue to monitor and treat per MD orders.

## 2017-10-15 NOTE — Telephone Encounter (Signed)
Patient did not stop by scheduling in order to schedule add on labs per 7/3 los.  She will come in on 7/5 and pick up new schedule with appointments added

## 2017-10-15 NOTE — Telephone Encounter (Signed)
Canceled per Md

## 2017-10-15 NOTE — H&P (Addendum)
Date: 10/15/2017               Patient Name:  Peggy House MRN: [id number]  DOB: 1961-01-09 Age / Sex: 57 y.o., female   PCP: Einar Gip, DO         Medical Service: Internal Medicine Teaching Service         Attending Physician: Dr. Bartholomew Crews, MD    First Contact: Dr. Koleen Distance Pager: [phone]  Second Contact: Dr. Jari Favre Pager: [phone]       After Hours (After 5p/  First Contact Pager: [phone]  weekends / holidays): Second Contact Pager: [phone]   Chief Complaint: Dark red stools  History of Present Illness:  Patient is a 57 year old female with a PMH of Hereditary hemorrhagic telangiectasia, gastric AVM, HLD, HTN, morbid obesity, OA, iron deficiency anemia, and MDD presenting today as a direct admit from the oncologist office where she was found to be anemic with a Hgb of 5.6 and having ongoing bloody bowel movements.   She reports that she did well over the weekend after being discharged on Friday 6/28. On Monday 7/1 she reports starting to have dark red BM that were loose. She had 2 BM today with dark red blood and clots. She denies any symptoms of anemia including dizziness, light headedness, or fatigue. She also denies any fever, nausea, vomiting, abdominal pain, chest pain, or SOB. She does states that she has not been taking her iron pills because she forgot about them. Her vitals on arrival showed that she was not tachycardic but hypotensive to 98/48.  At the oncologist she reported having red and black stools, was tired and had dyspnea on exertion. She was severely anemic with a Hgb of 5.6 and active GI bleed. She was sent to [facility] for further GI intervention and stabilization of her Hgb. Her tranexamic acid was stopped and she was started on tamoxifen.   Per chart, patient has had 3 admissions over the past month for GI bleeding. She was hospitalized from 6/9-6/13 for upper GI bleed with ulceration presenting with hematemesis. An EGD done by Dr.  Watt Climes showed blood.coffee ground material in the lesser curvature of the stomach and a duodenal ulcer. She was given 1 dose of Feraheme and discharged with a Hgb of 8.4. Hospitalized again from 6/17-6/21 for worsening anemia, an upper endoscopy was done that showed multiple non bleeding AVMs, sje was govem 6 units of RBCs and discharged with a Hgb of 7.7. Her last admission was on 6/24-6/28 for hematemesis and melena and a Hgb of 4.7. An EGD on 6/26 was fairly unremarkable. Capsule endoscopy showed evidence of fresh blood felt to be related to active epistaxis at the time, with small AVMs in the proximal small bowel. She was given another dose IV Feraheme on 6/27 and suffered a sever reaction from this causing her eyes to roll back, she arched her back and stopped breathing for a few seconds per RN note. She was discharged with a Hgb of 9.0.    Meds:  No current facility-administered medications on file prior to encounter.    Current Outpatient Medications on File Prior to Encounter  Medication Sig Dispense Refill  . acetaminophen (TYLENOL) 325 MG tablet Take 650 mg by mouth every 6 (six) hours as needed for mild pain or headache.     . albuterol (PROVENTIL HFA;VENTOLIN HFA) 108 (90 Base) MCG/ACT inhaler Inhale 1-2 puffs into the lungs every 6 (six) hours as needed  for wheezing or shortness of breath. 1 Inhaler 3  . amLODipine (NORVASC) 10 MG tablet Take 1 tablet (10 mg total) by mouth daily. 30 tablet 0  . atorvastatin (LIPITOR) 80 MG tablet take 1 tablet by mouth once daily 30 tablet 11  . pantoprazole (PROTONIX) 40 MG tablet Take 1 tablet (40 mg total) by mouth 2 (two) times daily. 180 tablet 0  . sertraline (ZOLOFT) 50 MG tablet Take 1 tablet (50 mg total) by mouth daily. 30 tablet 1  . tranexamic acid (LYSTEDA) 650 MG TABS tablet Take 2 tablets (1,300 mg total) by mouth 3 (three) times daily for 7 days. 42 tablet 0  . ferrous gluconate (FERGON) 324 MG tablet Take 1 tablet (324 mg total) by mouth  3 (three) times daily with meals. (Patient taking differently: Take 324 mg by mouth daily with breakfast. ) 90 tablet 0  . tamoxifen (NOLVADEX) 20 MG tablet Take 1 tablet (20 mg total) by mouth daily. (Patient not taking: Reported on 10/15/2017) 30 tablet 3     Allergies: Allergies as of 10/15/2017 - Review Complete 10/15/2017  Allergen Reaction Noted  . Feraheme [ferumoxytol]  10/09/2017  . Nsaids Other (See Comments) 04/25/2014  . Tomato Hives 01/01/2012  . Wasp venom Swelling 10/06/2017   Past Medical History:  Diagnosis Date  . Anxiety   . Arthritis    knnes,back  . GERD (gastroesophageal reflux disease)   . History of swelling of feet   . Hyperlipidemia   . Hypertension   . Major depressive disorder, recurrent episode ([place]) 06/05/2015  . Obesity   . Snores     Family History: Maternal aunt has HHT.   Social History: Smokes 1/2 ppd since the age of 64, tried quitting but has been unsuccessful. Denies EtOH and other drug use. Lives at home with her husband.   Review of Systems: A complete ROS was negative except as per HPI.   Physical Exam: Blood pressure (!) 98/48, pulse 82, temperature 99.2 F (37.3 C), temperature source Oral, resp. rate 18, height 5' 5.5" (1.664 m), weight 257 lb 8 oz (116.8 kg), SpO2 100 %. General: alert, cooperative and appears stated age 27: extra ocular movement intact, conjunctival pallor Heart: 3/6 systolic murmur, S1 and S2 normal, regular rate and rhythm Lungs: clear to auscultation, no wheezes or rales and unlabored breathing Abdomen: abdomen is soft without significant tenderness, masses, organomegaly or guarding Extremities: extremities normal, atraumatic, no cyanosis or edema, capillary refill normal Neurology: Alert and oriented x3, speech normal Psych: Normal mood and affect  Assessment & Plan by Problem: This is a 57 year old African American female with a history of hereditary hemorrhagic telangiectasias, gastric AVMs, IDA, HTN,  and multiple admissions for upper and lower GI bleeding presenting from the oncologist office due to lower GI bleeding, signs of anemia, and a Hgb of 5.6. She reports dark red stools that began 2 days ago. She reports them as loose stools with clots of blood present in it. She denies any dizziness, fatigue, CP, or abdominal pain. Her vitals showed hypotension and she was not tachycardic. She has not been taking her iron pills because she states she forgot about them.  Anemia due to lower GI bleed: Admitted from oncologist office due to Hgb of 5.6 and lower GI bleed. Has had multiple admissions for upper and lower GI bleeding, requiring blood transfusions. Previous EGDs have shown AVMs and duodenal ulcers. Given her history this is likely due to recurrent AVM bleeding. On admission  patient is hypotensive and has some pallor but denies any fatigue, or dizziness. She had a reaction to IV Feraheme on last admission.  - Repeat CBC than transfuse 2 units of pRBC - Recheck Hgb post transfusion - Daily CBC, BMP - Bolus of 1L NS, followed by 150 cc/hr - Tamoxifen 20 mg - IV pantoprazole 40 mg BID - Iron tablet 324 TID - Aminocaproic acid 1.6 g BID (ordered by oncology) - GI consult  - Placed on telemetry  HTN: on amlodipine 10 mg at home, was hypotensive on arrival. - Hold hypertensive medications - Monitor vitals  HLD: On atorvastatin 40 mg  - Continue home dose  MDD: on sertraline 50 mg at home - Continue home medication doses  Tobacco use: smokes 1/2 ppd, - Nicotine patch    FEN: NS 150cc/hr, replete lytes prn, regular diet, NPO after midnight VTE ppx: SCDs Code Status: FULL     Dispo: Admit patient to Inpatient with expected length of stay greater than 2 midnights.  Signed: [name], MD 10/15/2017, 9:27 PM  Pager: [phone]

## 2017-10-16 DIAGNOSIS — K5521 Angiodysplasia of colon with hemorrhage: Secondary | ICD-10-CM

## 2017-10-16 DIAGNOSIS — K31811 Angiodysplasia of stomach and duodenum with bleeding: Secondary | ICD-10-CM

## 2017-10-16 LAB — BASIC METABOLIC PANEL
ANION GAP: 4 — AB (ref 5–15)
BUN: 7 mg/dL (ref 6–20)
CO2: 24 mmol/L (ref 22–32)
Calcium: 7.9 mg/dL — ABNORMAL LOW (ref 8.9–10.3)
Chloride: 115 mmol/L — ABNORMAL HIGH (ref 98–111)
Creatinine, Ser: 0.8 mg/dL (ref 0.44–1.00)
GFR calc Af Amer: >60 mL/min (ref >60)
GFR calc non Af Amer: >60 mL/min (ref >60)
GLUCOSE: 103 mg/dL — AB (ref 70–99)
Potassium: 3.8 mmol/L (ref 3.5–5.1)
Sodium: 143 mmol/L (ref 135–145)

## 2017-10-16 LAB — DIC (DISSEMINATED INTRAVASCULAR COAGULATION) PANEL
APTT: 29 s (ref 24–36)
FIBRINOGEN: 451 mg/dL (ref 210–475)
INR: 1.16
PLATELETS: 264 10*3/uL (ref 150–400)

## 2017-10-16 LAB — CBC
HEMATOCRIT: 23.1 % — AB (ref 36.0–46.0)
Hemoglobin: 7.4 g/dL — ABNORMAL LOW (ref 12.0–15.0)
MCH: 27.6 pg (ref 26.0–34.0)
MCHC: 32 g/dL (ref 30.0–36.0)
MCV: 86.2 fL (ref 78.0–100.0)
Platelets: 237 10*3/uL (ref 150–400)
RBC: 2.68 MIL/uL — AB (ref 3.87–5.11)
RDW: 18.5 % — ABNORMAL HIGH (ref 11.5–15.5)
WBC: 11.1 10*3/uL — ABNORMAL HIGH (ref 4.0–10.5)

## 2017-10-16 LAB — HEMOGLOBIN AND HEMATOCRIT, BLOOD
HCT: 25.9 % — ABNORMAL LOW (ref 36.0–46.0)
Hemoglobin: 8.3 g/dL — ABNORMAL LOW (ref 12.0–15.0)

## 2017-10-16 LAB — DIC (DISSEMINATED INTRAVASCULAR COAGULATION)PANEL
D-Dimer, Quant: 0.75 ug/mL-FEU — ABNORMAL HIGH (ref 0.00–0.50)
Prothrombin Time: 14.7 seconds (ref 11.4–15.2)
Smear Review: NONE SEEN

## 2017-10-16 MED ORDER — TAMOXIFEN CITRATE 10 MG PO TABS
20.0000 mg | ORAL_TABLET | Freq: Every day | ORAL | Status: DC
  Administered 2017-10-16 – 2017-10-22 (×7): 20 mg via ORAL
  Filled 2017-10-16 (×7): qty 2

## 2017-10-16 MED ORDER — SODIUM CHLORIDE 0.9 % IV SOLN: INTRAVENOUS | Status: DC

## 2017-10-16 MED ORDER — METHYLPREDNISOLONE SODIUM SUCC 125 MG IJ SOLR
60.0000 mg | Freq: Once | INTRAMUSCULAR | Status: AC
  Administered 2017-10-16: 60 mg via INTRAVENOUS
  Filled 2017-10-16: qty 2

## 2017-10-16 MED ORDER — DIPHENHYDRAMINE HCL 25 MG PO CAPS
25.0000 mg | ORAL_CAPSULE | Freq: Once | ORAL | Status: AC
  Administered 2017-10-16: 25 mg via ORAL
  Filled 2017-10-16: qty 1

## 2017-10-16 MED ORDER — AMINOCAPROIC ACID 500 MG PO TABS
1.0000 g | ORAL_TABLET | ORAL | Status: DC
  Administered 2017-10-16 – 2017-10-22 (×25): 1 g via ORAL
  Filled 2017-10-16 (×35): qty 2

## 2017-10-16 MED ORDER — SODIUM CHLORIDE 0.9 % IV SOLN
25.0000 mg | Freq: Once | INTRAVENOUS | Status: AC
  Administered 2017-10-16: 25 mg via INTRAVENOUS
  Filled 2017-10-16: qty 2

## 2017-10-16 NOTE — Progress Notes (Signed)
[facility]  Patient Status: Active (receiving services up to time of hospitalization)  [facility] is providing the following services: RN and PT  If patient discharges after hours, please call [phone].   Peggy House 10/16/2017, 11:20 AM

## 2017-10-16 NOTE — H&P (View-Only) (Signed)
Subjective:   HPI  The patient is a 57 year old female with a history of hereditary hemorrhagic telangiectasia.  She has had several admissions to the hospital this past month with anemia and GI bleeding.  She is being readmitted again because of anemia and further GI bleeding.  Her last endoscopy was on October 08, 2017.  This was unrevealing for any source of active bleeding.  It was also unrevealing for any telangiectasias or AVMs.  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  She denies hematemesis.  She started having melena and then red blood in her stool 3 days ago.  She was recently discharged from the hospital about a week ago.  She has been transfused multiple units of blood last night.  Hemoglobin is up.  Feels better today.  Denies any further bleeding today.  Review of Systems No chest pain or shortness of breath  Past Medical History:  Diagnosis Date  . Anxiety   . Arthritis    knnes,back  . GERD (gastroesophageal reflux disease)   . History of swelling of feet   . Hyperlipidemia   . Hypertension   . Major depressive disorder, recurrent episode ([place]) 06/05/2015  . Obesity   . Snores    Past Surgical History:  Procedure Laterality Date  . ABDOMINAL HYSTERECTOMY    . CARPAL TUNNEL RELEASE  05/13/2011   Procedure: CARPAL TUNNEL RELEASE;  Surgeon: Nita Sells, MD;  Location: [place];  Service: Orthopedics;  Laterality: Left;  . COLONOSCOPY WITH PROPOFOL N/A 04/28/2014   Procedure: COLONOSCOPY WITH PROPOFOL;  Surgeon: Cleotis Nipper, MD;  Location: [facility] ENDOSCOPY;  Service: Endoscopy;  Laterality: N/A;  . DG TOES*L*  2/10   rt  . DILATION AND CURETTAGE OF UTERUS    . ESOPHAGOGASTRODUODENOSCOPY N/A 04/10/2014   Procedure: ESOPHAGOGASTRODUODENOSCOPY (EGD);  Surgeon: [name], MD;  Location: [facility] ENDOSCOPY;  Service: Endoscopy;  Laterality: N/A;  .  ESOPHAGOGASTRODUODENOSCOPY N/A 05/10/2017   Procedure: ESOPHAGOGASTRODUODENOSCOPY (EGD);  Surgeon: Ronald Lobo, MD;  Location: [facility] ENDOSCOPY;  Service: Endoscopy;  Laterality: N/A;  . ESOPHAGOGASTRODUODENOSCOPY N/A 09/22/2017   Procedure: ESOPHAGOGASTRODUODENOSCOPY (EGD);  Surgeon: Clarene Essex, MD;  Location: [place];  Service: Endoscopy;  Laterality: N/A;  bedside  . ESOPHAGOGASTRODUODENOSCOPY (EGD) WITH PROPOFOL N/A 04/27/2014   Procedure: ESOPHAGOGASTRODUODENOSCOPY (EGD) WITH PROPOFOL;  Surgeon: Cleotis Nipper, MD;  Location: [place];  Service: Endoscopy;  Laterality: N/A;  possible apc  . ESOPHAGOGASTRODUODENOSCOPY (EGD) WITH PROPOFOL N/A 09/30/2017   Procedure: ESOPHAGOGASTRODUODENOSCOPY (EGD) WITH PROPOFOL;  Surgeon: [name], MD;  Location: [place];  Service: Gastroenterology;  Laterality: N/A;  . ESOPHAGOGASTRODUODENOSCOPY (EGD) WITH PROPOFOL N/A 10/01/2017   Procedure: ESOPHAGOGASTRODUODENOSCOPY (EGD) WITH PROPOFOL;  Surgeon: [name], MD;  Location: [place];  Service: Gastroenterology;  Laterality: N/A;  . ESOPHAGOGASTRODUODENOSCOPY (EGD) WITH PROPOFOL N/A 10/08/2017   Procedure: ESOPHAGOGASTRODUODENOSCOPY (EGD) WITH PROPOFOL;  Surgeon: Otis Brace, MD;  Location: [facility] ENDOSCOPY;  Service: Gastroenterology;  Laterality: N/A;  . GIVENS CAPSULE STUDY N/A 10/02/2017   Procedure: GIVENS CAPSULE STUDY;  Surgeon: [name], MD;  Location: [place];  Service: Gastroenterology;  Laterality: N/A;  . GIVENS CAPSULE STUDY N/A 10/08/2017   Procedure: GIVENS CAPSULE STUDY;  Surgeon: Otis Brace, MD;  Location: [place];  Service: Gastroenterology;  Laterality: N/A;  endoscopic placement of capsule  . HOT HEMOSTASIS N/A 04/27/2014   Procedure: HOT HEMOSTASIS (ARGON PLASMA COAGULATION/BICAP);  Surgeon:  Cleotis Nipper, MD;  Location: [facility] ENDOSCOPY;  Service: Endoscopy;  Laterality: N/A;  . HOT HEMOSTASIS N/A 09/30/2017   Procedure: HOT HEMOSTASIS (ARGON PLASMA  COAGULATION/BICAP);  Surgeon: [name], MD;  Location: [place];  Service: Gastroenterology;  Laterality: N/A;  . HOT HEMOSTASIS N/A 10/01/2017   Procedure: HOT HEMOSTASIS (ARGON PLASMA COAGULATION/BICAP);  Surgeon: [name], MD;  Location: [place];  Service: Gastroenterology;  Laterality: N/A;  . L shoulder Surgery  2011  . SUBMUCOSAL INJECTION  09/22/2017   Procedure: SUBMUCOSAL INJECTION;  Surgeon: Clarene Essex, MD;  Location: [facility] ENDOSCOPY;  Service: Endoscopy;;   Social History   Socioeconomic History  . Marital status: Single    Spouse name: Not on file  . Number of children: Not on file  . Years of education: Not on file  . Highest education level: Not on file  Occupational History  . Not on file  Social Needs  . Financial resource strain: Not on file  . Food insecurity:    Worry: Not on file    Inability: Not on file  . Transportation needs:    Medical: Not on file    Non-medical: Not on file  Tobacco Use  . Smoking status: Current Every Day Smoker    Packs/day: 0.50    Years: 30.00    Pack years: 15.00    Types: Cigarettes  . Smokeless tobacco: Former Systems developer    Types: Snuff    Quit date: 22  . Tobacco comment: 1/2 PPD  Substance and Sexual Activity  . Alcohol use: No    Alcohol/week: 0.0 oz  . Drug use: No    Comment: last cocaine-2010  . Sexual activity: Yes    Birth control/protection: Surgical    Comment: Hysterectomy  Lifestyle  . Physical activity:    Days per week: Not on file    Minutes per session: Not on file  . Stress: Not on file  Relationships  . Social connections:    Talks on phone: Not on file    Gets together: Not on file    Attends religious service: Not on file    Active member of club or organization: Not on file    Attends meetings of clubs or organizations: Not on file    Relationship status: Not on file  . Intimate partner violence:    Fear of current or ex partner: Not on file    Emotionally abused: Not on file     Physically abused: Not on file    Forced sexual activity: Not on file  Other Topics Concern  . Not on file  Social History Narrative  . Not on file   family history includes Bleeding Disorder in her mother, sister, and son; Cancer in her mother; Colon cancer in her maternal grandfather; Crohn's disease in her maternal grandfather; Diabetes in her mother and sister; Diabetes type II in her sister; Kidney disease in her mother and son; Ovarian cancer in her mother; Stomach cancer in her maternal grandmother.  Current Facility-Administered Medications:  .  0.9 %  sodium chloride infusion (Manually program via Guardrails IV Fluids), , Intravenous, Once, Velna Ochs, MD .  0.9 %  sodium chloride infusion (Manually program via Guardrails IV Fluids), , Intravenous, Once, Sherry Ruffing, Marissa M, MD .  albuterol (PROVENTIL) (2.5 MG/3ML) 0.083% nebulizer solution 2.5 mg, 2.5 mg, Nebulization, Q6H PRN, Velna Ochs, MD .  aminocaproic acid (AMICAR) tablet 1 g, 1 g, Oral, Q4H while awake, Truitt Merle, MD, 1 g at  10/16/17 0934 .  atorvastatin (LIPITOR) tablet 80 mg, 80 mg, Oral, Daily, Velna Ochs, MD, 80 mg at 10/16/17 0930 .  diphenhydrAMINE (BENADRYL) capsule 25 mg, 25 mg, Oral, Once, Truitt Merle, MD .  ferric gluconate (NULECIT) 25 mg in sodium chloride 0.9 % 50 mL IVPB, 25 mg, Intravenous, Once, Truitt Merle, MD .  ferrous gluconate [facility]) tablet 324 mg, 324 mg, Oral, TID WC, Velna Ochs, MD, 324 mg at 10/16/17 0931 .  methylPREDNISolone sodium succinate (SOLU-MEDROL) 125 mg/2 mL injection 60 mg, 60 mg, Intravenous, Once, Truitt Merle, MD .  nicotine (NICODERM CQ - dosed in mg/24 hours) patch 14 mg, 14 mg, Transdermal, Daily, Velna Ochs, MD, 14 mg at 10/16/17 0946 .  pantoprazole (PROTONIX) injection 40 mg, 40 mg, Intravenous, Q12H, Velna Ochs, MD, 40 mg at 10/16/17 0948 .  sertraline (ZOLOFT) tablet 50 mg, 50 mg, Oral, Daily, Velna Ochs, MD, 50 mg at 10/16/17 0930 .   tamoxifen (NOLVADEX) tablet 20 mg, 20 mg, Oral, Daily, Alphonzo Grieve, MD Allergies  Allergen Reactions  . Feraheme [Ferumoxytol]     Muscle tetany, encephalopathy, fatigue, nausea  . Nsaids Other (See Comments)    Stomach bleeding episodes; Tylenol is OK  . Tomato Hives  . Wasp Venom Swelling     Objective:     BP 129/71 (BP Location: Left Arm)   Pulse 72   Temp 98.9 F (37.2 C) (Oral)   Resp 16   Ht 5' 5.5" (1.664 m)   Wt 116.8 kg (257 lb 8 oz)   SpO2 98%   BMI 42.20 kg/m   No distress  Heart regular rhythm no murmurs  Lungs clear  Abdomen soft and nontender  Laboratory No components found for: D1    Assessment:     GI bleeding from hereditary hemorrhagic telangiectasia most probable.  Transfuse blood as needed.  Follow H&H.      Plan:     We will proceed with upper GI enteroscopy tomorrow. Lab Results  Component Value Date   HGB 7.4 (L) 10/16/2017   HGB 3.7 (LL) 10/15/2017   HGB 5.6 (LL) 10/15/2017   HGB 9.0 (L) 10/10/2017   HGB 8.4 (L) 05/20/2017   HGB 8.1 (L) 02/05/2017   HGB 9.7 (L) 11/28/2016   HGB 9.4 (L) 10/29/2016   HGB 8.3 (L) 05/13/2016   HGB 8.4 (L) 05/13/2016   HCT 23.1 (L) 10/16/2017   HCT 12.8 (L) 10/15/2017   HCT 17.7 (L) 10/15/2017   HCT 28.8 (L) 05/20/2017   HCT 26.6 (L) 02/05/2017   HCT 31.7 (L) 11/28/2016   HCT 31.6 (L) 10/29/2016   HCT 28.4 (L) 05/13/2016   HCT 28.9 (L) 05/13/2016   ALKPHOS 52 10/06/2017   ALKPHOS 67 09/21/2017   ALKPHOS 86 05/07/2017   ALKPHOS 84 02/05/2017   ALKPHOS 112 11/28/2016   AST 11 (L) 10/06/2017   AST 13 (L) 09/21/2017   AST 13 (L) 05/07/2017   AST 13 02/05/2017   AST 21 11/28/2016   ALT 6 (L) 10/06/2017   ALT 8 (L) 09/21/2017   ALT 8 (L) 05/07/2017   ALT 8 02/05/2017   ALT 15 11/28/2016   AMYLASE 70 09/28/2008

## 2017-10-16 NOTE — Progress Notes (Addendum)
   Subjective: Patient's repeat hemoglobin upon admission was 3.7. She is s/p transfusion x 3 units. When we saw her this morning, she said she felt better overall with more energy. She has not had any more bloody bowel movements since yesterday. Denies headache, shortness of breath, lightheadedness, palpitations, n/v or hematemesis.  Objective:  Vital signs in last 24 hours: Vitals:   10/16/17 0057 10/16/17 0313 10/16/17 0346 10/16/17 0633  BP: (!) 112/56 117/66 (!) 114/59 129/71  Pulse: 79 73 71 72  Resp: 18 18 18 16   Temp: 98.7 F (37.1 C) 99.3 F (37.4 C) 98.7 F (37.1 C) 98.9 F (37.2 C)  TempSrc: Oral Oral Oral Oral  SpO2: 98% 100% 100% 98%  Weight:      Height:       General: awake, alert, very pleasant female lying in bed in NAD.  Cardiovascular: regular rate and rhythm. SEM heard best at upper sternal borders.  Respiratory: no increased work of breathing. Lungs clear to auscultation without rales or wheezing. Abdomen: soft, non-tender. Bowel sounds +. Extremities: no bilateral upper extremity edema.   Assessment/Plan:  Active Problems:   GI bleed  Anemia secondary to acute GI blood loss. Admitted from oncologist office due to Hgb of 5.6 in the setting of melena and bright red blood in stools. Repeat hgb on arrival was 3.7. She has had multiple admissions for upper and lower GI bleeding, requiring blood transfusions. Previous EGDs have shown AVMs and duodenal ulcers. Given her history this is likely due to recurrent AVM bleeding. On admission patient is hypotensive and has some pallor but denies any fatigue, or dizziness. She had a reaction to IV Feraheme on last admission.  - Patient was hypotensive in the ED but has responded appropriated to IV fluid resuscitation. Will continue NS  - S/p transfusion x3. Repeat CBC shows hgb of 7.4.  - Continue Tamoxifen and aminocaproic acid per oncology  - IV pantoprazole 40 mg BID - Iron tablet 324 TID - GI has been consulted and  plan for upper endoscopy tomorrow.  - Continue telemetry   HTN: hold home amlodipine since she was hypotensive on arrival. Will continue to monitor blood pressure.   HLD: continue atorvastatin 40 mg  MDD: continue sertraline 50 mg.   Tobacco use: smokes 1/2 ppd, - Nicotine patch     Dispo: Anticipated discharge in approximately 1-2 day(s).   Modena Nunnery D, DO 10/16/2017, 10:10 AM Pager: [phone]

## 2017-10-16 NOTE — Progress Notes (Signed)
Peggy House   DOB:1960-08-31   QQ#:[id number]   EPP#:[id number]  Hematology follow up  Subjective: Pt is well-known to me.  She has been admitted to multiple times (3) for GI bleeding in the past months, last was discharged a week ago.  Saw her yesterday in my office, her hemoglobin dropped to 5.6, and she has had bloody stool for the past 3 days.  She was readmitted yesterday.  She states that she had only one bloody stool yesterday afternoon, and has not had any bowel movement since then.  She received 1 dose of Amicar last night, and 3 units of RBC, hemoglobin went up to 7.4 this morning..  She feels better overall.  No other new complaints.   Objective:  Vitals:   10/16/17 0346 10/16/17 0633  BP: (!) 114/59 129/71  Pulse: 71 72  Resp: 18 16  Temp: 98.7 F (37.1 C) 98.9 F (37.2 C)  SpO2: 100% 98%    Body mass index is 42.2 kg/m.  Intake/Output Summary (Last 24 hours) at 10/16/2017 1037 Last data filed at 10/16/2017 6063 Gross per 24 hour  Intake 945 ml  Output -  Net 945 ml     Sclerae unicteric  Oropharynx clear, she has a few telangiectasia in her mucosa and tongue, but nothing in her fingertips and skin   No peripheral adenopathy  Lungs clear -- no rales or rhonchi  Heart regular rate and rhythm  Abdomen benign  MSK no focal spinal tenderness, no peripheral edema  Neuro nonfocal   CBG (last 3)  No results for input(s): GLUCAP in the last 72 hours.   Labs:  Lab Results  Component Value Date   WBC 11.1 (H) 10/16/2017   HGB 7.4 (L) 10/16/2017   HCT 23.1 (L) 10/16/2017   MCV 86.2 10/16/2017   PLT 264 10/16/2017   NEUTROABS 9.5 (H) 10/15/2017   CMP Latest Ref Rng & Units 10/16/2017 10/10/2017 10/09/2017  Glucose 70 - 99 mg/dL 103(H) 98 92  BUN 6 - 20 mg/dL 7 <5(L) <5(L)  Creatinine 0.44 - 1.00 mg/dL 0.80 0.73 0.77  Sodium 135 - 145 mmol/L 143 144 145  Potassium 3.5 - 5.1 mmol/L 3.8 3.6 3.2(L)  Chloride 98 - 111 mmol/L 115(H) 113(H) 116(H)  CO2 22 - 32 mmol/L 24 26  21(L)  Calcium 8.9 - 10.3 mg/dL 7.9(L) 8.2(L) 7.7(L)  Total Protein 6.5 - 8.1 g/dL - - -  Total Bilirubin 0.3 - 1.2 mg/dL - - -  Alkaline Phos 38 - 126 U/L - - -  AST 15 - 41 U/L - - -  ALT 14 - 54 U/L - - -     Urine Studies No results for input(s): UHGB, CRYS in the last 72 hours.  Invalid input(s): UACOL, UAPR, USPG, UPH, UTP, UGL, UKET, UBIL, UNIT, UROB, ULEU, UEPI, UWBC, URBC, UBAC, CAST, UCOM, BILUA  Basic Metabolic Panel: Recent Labs  Lab 10/10/17 0745 10/16/17 0805  NA 144 143  K 3.6 3.8  CL 113* 115*  CO2 26 24  GLUCOSE 98 103*  BUN <5* 7  CREATININE 0.73 0.80  CALCIUM 8.2* 7.9*   GFR Estimated Creatinine Clearance: 101.2 mL/min (by C-G formula based on SCr of 0.8 mg/dL). Liver Function Tests: No results for input(s): AST, ALT, ALKPHOS, BILITOT, PROT, ALBUMIN in the last 168 hours. No results for input(s): LIPASE, AMYLASE in the last 168 hours. No results for input(s): AMMONIA in the last 168 hours. Coagulation profile Recent Labs  Lab 10/16/17 [phone]  INR PENDING    CBC: Recent Labs  Lab 10/10/17 0256 10/15/17 1116 10/15/17 2027 10/16/17 0805 10/16/17 0925  WBC 9.7 11.9* 9.9 11.1*  --   NEUTROABS  --  9.5*  --   --   --   HGB 9.0* 5.6* 3.7* 7.4*  --   HCT 29.5* 17.7* 12.8* 23.1*  --   MCV 89.4 85.3 93.4 86.2  --   PLT 328 332 206 237 264   Cardiac Enzymes: No results for input(s): CKTOTAL, CKMB, CKMBINDEX, TROPONINI in the last 168 hours. BNP: Invalid input(s): POCBNP CBG: No results for input(s): GLUCAP in the last 168 hours. D-Dimer Recent Labs    10/16/17 0925  DDIMER PENDING   Hgb A1c No results for input(s): HGBA1C in the last 72 hours. Lipid Profile No results for input(s): CHOL, HDL, LDLCALC, TRIG, CHOLHDL, LDLDIRECT in the last 72 hours. Thyroid function studies No results for input(s): TSH, T4TOTAL, T3FREE, THYROIDAB in the last 72 hours.  Invalid input(s): FREET3 Anemia work up Recent Labs    10/15/17 1116  FERRITIN 61   TIBC 229*  IRON 10*  RETICCTPCT 8.6*   Microbiology Recent Results (from the past 240 hour(s))  MRSA PCR Screening     Status: None   Collection Time: 10/07/17  5:56 AM  Result Value Ref Range Status   MRSA by PCR NEGATIVE NEGATIVE Final    Comment:        The GeneXpert MRSA Assay (FDA approved for NASAL specimens only), is one component of a comprehensive MRSA colonization surveillance program. It is not intended to diagnose MRSA infection nor to guide or monitor treatment for MRSA infections. Performed at [facility], [place] [address], [place], [place] [address]       Studies:  No results found.  Assessment: 57 y.o. with chronic recurrent epistaxis since childhood, recurrent GI bleeding with AVMs  1. 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  Severe anemia from GI bleeding and iron deficiency 4. HTN   Plan:  -Her Hg dropped to 3.7 last night, although no BM since yesterday afternoon and she responded well to blood transfusion (3u last night) -I will check DIC and VWD panel to rule out other bleeding disorder. Her PT and APTT were normal, no need to check cotting factors  -will increase amicar to 1g every 4 hrs while awake (5 times a day)  -she has iron deficiency form her chronic GI bleeding, she tolerated first dose of Feraheme well, but developed near syncope on the first dose of Feraheme.  Feraheme has been listed as allergy.  Although cross allergy can happen but uncommon, it's probably worth of give her a testing dose of ferric gluconate 25mg  with premedication of Benadryl and a sore Medrol today, to see if she can tolerate.  If she tolerates, will give one dose of ferric gluconate tomorrow.  She would need aggressive iron replacement due to the iron  deficiency from GI bleeding.  If she truly cannot tolerate IV iron, then will continue ferrous gluconate oral 3 times a day. -continue tamoxifen 20mg  daily, this will not stop her acute bleeding, but hopefully will decrease her AVM and reduce her risk of recurrent bleeding in future  -I spoke with GI Dr. Penelope Coop, he plans to do EGD tomorrow  -blood transfusion  to keep her Hg>8.0  -will f/u closely   Truitt Merle, MD 10/16/2017  10:37 AM

## 2017-10-16 NOTE — Progress Notes (Signed)
Nutrition Brief Note  Patient identified on the Malnutrition Screening Tool (MST) Report  Pt admitted for blood in stools which is recurrent. Plan for EGD tomorrow.   Per pt her usual weight is 260 lb and has lost a couple of pounds due to hospitalization in June. She reports good appetite, does drink ensure at home sometimes as she likes it. She lives at home with her husband and she does the cooking.  No findings on the nutrition-focused physical exam. No muscle or fat depletion and no edema noted on extremities.  Pt is hungry but currently  NPO for EDG tomorrow.   Wt Readings from Last 15 Encounters:  10/15/17 257 lb 8 oz (116.8 kg)  10/15/17 257 lb 11.2 oz (116.9 kg)  10/08/17 266 lb (120.7 kg)  10/03/17 260 lb 11.2 oz (118.3 kg)  09/29/17 252 lb 1.6 oz (114.4 kg)  09/25/17 257 lb 8 oz (116.8 kg)  09/09/17 274 lb 8 oz (124.5 kg)  05/20/17 256 lb 11.2 oz (116.4 kg)  05/08/17 266 lb (120.7 kg)  05/07/17 266 lb 12.8 oz (121 kg)  04/02/17 281 lb 1.6 oz (127.5 kg)  02/27/17 274 lb 11.2 oz (124.6 kg)  12/25/16 268 lb 3.2 oz (121.7 kg)  12/03/16 269 lb 9.6 oz (122.3 kg)  11/28/16 270 lb 9.6 oz (122.7 kg)    Body mass index is 42.2 kg/m. Patient meets criteria for morbid obesity based on current BMI.   Current diet order is NPO. Labs and medications reviewed.   No nutrition interventions warranted at this time. If nutrition issues arise, please consult RD.   [place], [place], [place] Pager [phone] After Hours Pager

## 2017-10-16 NOTE — Consult Note (Signed)
Subjective:   HPI  The patient is a 57 year old female with a history of hereditary hemorrhagic telangiectasia.  She has had several admissions to the hospital this past month with anemia and GI bleeding.  She is being readmitted again because of anemia and further GI bleeding.  Her last endoscopy was on October 08, 2017.  This was unrevealing for any source of active bleeding.  It was also unrevealing for any telangiectasias or AVMs.  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  She denies hematemesis.  She started having melena and then red blood in her stool 3 days ago.  She was recently discharged from the hospital about a week ago.  She has been transfused multiple units of blood last night.  Hemoglobin is up.  Feels better today.  Denies any further bleeding today.  Review of Systems No chest pain or shortness of breath  Past Medical History:  Diagnosis Date  . Anxiety   . Arthritis    knnes,back  . GERD (gastroesophageal reflux disease)   . History of swelling of feet   . Hyperlipidemia   . Hypertension   . Major depressive disorder, recurrent episode ([place]) 06/05/2015  . Obesity   . Snores    Past Surgical History:  Procedure Laterality Date  . ABDOMINAL HYSTERECTOMY    . CARPAL TUNNEL RELEASE  05/13/2011   Procedure: CARPAL TUNNEL RELEASE;  Surgeon: Nita Sells, MD;  Location: [place];  Service: Orthopedics;  Laterality: Left;  . COLONOSCOPY WITH PROPOFOL N/A 04/28/2014   Procedure: COLONOSCOPY WITH PROPOFOL;  Surgeon: Cleotis Nipper, MD;  Location: [facility] ENDOSCOPY;  Service: Endoscopy;  Laterality: N/A;  . DG TOES*L*  2/10   rt  . DILATION AND CURETTAGE OF UTERUS    . ESOPHAGOGASTRODUODENOSCOPY N/A 04/10/2014   Procedure: ESOPHAGOGASTRODUODENOSCOPY (EGD);  Surgeon: [name], MD;  Location: [facility] ENDOSCOPY;  Service: Endoscopy;  Laterality: N/A;  .  ESOPHAGOGASTRODUODENOSCOPY N/A 05/10/2017   Procedure: ESOPHAGOGASTRODUODENOSCOPY (EGD);  Surgeon: Ronald Lobo, MD;  Location: [facility] ENDOSCOPY;  Service: Endoscopy;  Laterality: N/A;  . ESOPHAGOGASTRODUODENOSCOPY N/A 09/22/2017   Procedure: ESOPHAGOGASTRODUODENOSCOPY (EGD);  Surgeon: Clarene Essex, MD;  Location: [place];  Service: Endoscopy;  Laterality: N/A;  bedside  . ESOPHAGOGASTRODUODENOSCOPY (EGD) WITH PROPOFOL N/A 04/27/2014   Procedure: ESOPHAGOGASTRODUODENOSCOPY (EGD) WITH PROPOFOL;  Surgeon: Cleotis Nipper, MD;  Location: [place];  Service: Endoscopy;  Laterality: N/A;  possible apc  . ESOPHAGOGASTRODUODENOSCOPY (EGD) WITH PROPOFOL N/A 09/30/2017   Procedure: ESOPHAGOGASTRODUODENOSCOPY (EGD) WITH PROPOFOL;  Surgeon: [name], MD;  Location: [place];  Service: Gastroenterology;  Laterality: N/A;  . ESOPHAGOGASTRODUODENOSCOPY (EGD) WITH PROPOFOL N/A 10/01/2017   Procedure: ESOPHAGOGASTRODUODENOSCOPY (EGD) WITH PROPOFOL;  Surgeon: [name], MD;  Location: [place];  Service: Gastroenterology;  Laterality: N/A;  . ESOPHAGOGASTRODUODENOSCOPY (EGD) WITH PROPOFOL N/A 10/08/2017   Procedure: ESOPHAGOGASTRODUODENOSCOPY (EGD) WITH PROPOFOL;  Surgeon: Otis Brace, MD;  Location: [facility] ENDOSCOPY;  Service: Gastroenterology;  Laterality: N/A;  . GIVENS CAPSULE STUDY N/A 10/02/2017   Procedure: GIVENS CAPSULE STUDY;  Surgeon: [name], MD;  Location: [place];  Service: Gastroenterology;  Laterality: N/A;  . GIVENS CAPSULE STUDY N/A 10/08/2017   Procedure: GIVENS CAPSULE STUDY;  Surgeon: Otis Brace, MD;  Location: [place];  Service: Gastroenterology;  Laterality: N/A;  endoscopic placement of capsule  . HOT HEMOSTASIS N/A 04/27/2014   Procedure: HOT HEMOSTASIS (ARGON PLASMA COAGULATION/BICAP);  Surgeon:  Cleotis Nipper, MD;  Location: [facility] ENDOSCOPY;  Service: Endoscopy;  Laterality: N/A;  . HOT HEMOSTASIS N/A 09/30/2017   Procedure: HOT HEMOSTASIS (ARGON PLASMA  COAGULATION/BICAP);  Surgeon: [name], MD;  Location: [place];  Service: Gastroenterology;  Laterality: N/A;  . HOT HEMOSTASIS N/A 10/01/2017   Procedure: HOT HEMOSTASIS (ARGON PLASMA COAGULATION/BICAP);  Surgeon: [name], MD;  Location: [place];  Service: Gastroenterology;  Laterality: N/A;  . L shoulder Surgery  2011  . SUBMUCOSAL INJECTION  09/22/2017   Procedure: SUBMUCOSAL INJECTION;  Surgeon: Clarene Essex, MD;  Location: [facility] ENDOSCOPY;  Service: Endoscopy;;   Social History   Socioeconomic History  . Marital status: Single    Spouse name: Not on file  . Number of children: Not on file  . Years of education: Not on file  . Highest education level: Not on file  Occupational History  . Not on file  Social Needs  . Financial resource strain: Not on file  . Food insecurity:    Worry: Not on file    Inability: Not on file  . Transportation needs:    Medical: Not on file    Non-medical: Not on file  Tobacco Use  . Smoking status: Current Every Day Smoker    Packs/day: 0.50    Years: 30.00    Pack years: 15.00    Types: Cigarettes  . Smokeless tobacco: Former Systems developer    Types: Snuff    Quit date: 39  . Tobacco comment: 1/2 PPD  Substance and Sexual Activity  . Alcohol use: No    Alcohol/week: 0.0 oz  . Drug use: No    Comment: last cocaine-2010  . Sexual activity: Yes    Birth control/protection: Surgical    Comment: Hysterectomy  Lifestyle  . Physical activity:    Days per week: Not on file    Minutes per session: Not on file  . Stress: Not on file  Relationships  . Social connections:    Talks on phone: Not on file    Gets together: Not on file    Attends religious service: Not on file    Active member of club or organization: Not on file    Attends meetings of clubs or organizations: Not on file    Relationship status: Not on file  . Intimate partner violence:    Fear of current or ex partner: Not on file    Emotionally abused: Not on file     Physically abused: Not on file    Forced sexual activity: Not on file  Other Topics Concern  . Not on file  Social History Narrative  . Not on file   family history includes Bleeding Disorder in her mother, sister, and son; Cancer in her mother; Colon cancer in her maternal grandfather; Crohn's disease in her maternal grandfather; Diabetes in her mother and sister; Diabetes type II in her sister; Kidney disease in her mother and son; Ovarian cancer in her mother; Stomach cancer in her maternal grandmother.  Current Facility-Administered Medications:  .  0.9 %  sodium chloride infusion (Manually program via Guardrails IV Fluids), , Intravenous, Once, Velna Ochs, MD .  0.9 %  sodium chloride infusion (Manually program via Guardrails IV Fluids), , Intravenous, Once, Sherry Ruffing, Marissa M, MD .  albuterol (PROVENTIL) (2.5 MG/3ML) 0.083% nebulizer solution 2.5 mg, 2.5 mg, Nebulization, Q6H PRN, Velna Ochs, MD .  aminocaproic acid (AMICAR) tablet 1 g, 1 g, Oral, Q4H while awake, Truitt Merle, MD, 1 g at  10/16/17 0934 .  atorvastatin (LIPITOR) tablet 80 mg, 80 mg, Oral, Daily, Velna Ochs, MD, 80 mg at 10/16/17 0930 .  diphenhydrAMINE (BENADRYL) capsule 25 mg, 25 mg, Oral, Once, Truitt Merle, MD .  ferric gluconate (NULECIT) 25 mg in sodium chloride 0.9 % 50 mL IVPB, 25 mg, Intravenous, Once, Truitt Merle, MD .  ferrous gluconate [facility]) tablet 324 mg, 324 mg, Oral, TID WC, Velna Ochs, MD, 324 mg at 10/16/17 0931 .  methylPREDNISolone sodium succinate (SOLU-MEDROL) 125 mg/2 mL injection 60 mg, 60 mg, Intravenous, Once, Truitt Merle, MD .  nicotine (NICODERM CQ - dosed in mg/24 hours) patch 14 mg, 14 mg, Transdermal, Daily, Velna Ochs, MD, 14 mg at 10/16/17 0946 .  pantoprazole (PROTONIX) injection 40 mg, 40 mg, Intravenous, Q12H, Velna Ochs, MD, 40 mg at 10/16/17 0948 .  sertraline (ZOLOFT) tablet 50 mg, 50 mg, Oral, Daily, Velna Ochs, MD, 50 mg at 10/16/17 0930 .   tamoxifen (NOLVADEX) tablet 20 mg, 20 mg, Oral, Daily, Alphonzo Grieve, MD Allergies  Allergen Reactions  . Feraheme [Ferumoxytol]     Muscle tetany, encephalopathy, fatigue, nausea  . Nsaids Other (See Comments)    Stomach bleeding episodes; Tylenol is OK  . Tomato Hives  . Wasp Venom Swelling     Objective:     BP 129/71 (BP Location: Left Arm)   Pulse 72   Temp 98.9 F (37.2 C) (Oral)   Resp 16   Ht 5' 5.5" (1.664 m)   Wt 116.8 kg (257 lb 8 oz)   SpO2 98%   BMI 42.20 kg/m   No distress  Heart regular rhythm no murmurs  Lungs clear  Abdomen soft and nontender  Laboratory No components found for: D1    Assessment:     GI bleeding from hereditary hemorrhagic telangiectasia most probable.  Transfuse blood as needed.  Follow H&H.      Plan:     We will proceed with upper GI enteroscopy tomorrow. Lab Results  Component Value Date   HGB 7.4 (L) 10/16/2017   HGB 3.7 (LL) 10/15/2017   HGB 5.6 (LL) 10/15/2017   HGB 9.0 (L) 10/10/2017   HGB 8.4 (L) 05/20/2017   HGB 8.1 (L) 02/05/2017   HGB 9.7 (L) 11/28/2016   HGB 9.4 (L) 10/29/2016   HGB 8.3 (L) 05/13/2016   HGB 8.4 (L) 05/13/2016   HCT 23.1 (L) 10/16/2017   HCT 12.8 (L) 10/15/2017   HCT 17.7 (L) 10/15/2017   HCT 28.8 (L) 05/20/2017   HCT 26.6 (L) 02/05/2017   HCT 31.7 (L) 11/28/2016   HCT 31.6 (L) 10/29/2016   HCT 28.4 (L) 05/13/2016   HCT 28.9 (L) 05/13/2016   ALKPHOS 52 10/06/2017   ALKPHOS 67 09/21/2017   ALKPHOS 86 05/07/2017   ALKPHOS 84 02/05/2017   ALKPHOS 112 11/28/2016   AST 11 (L) 10/06/2017   AST 13 (L) 09/21/2017   AST 13 (L) 05/07/2017   AST 13 02/05/2017   AST 21 11/28/2016   ALT 6 (L) 10/06/2017   ALT 8 (L) 09/21/2017   ALT 8 (L) 05/07/2017   ALT 8 02/05/2017   ALT 15 11/28/2016   AMYLASE 70 09/28/2008

## 2017-10-17 ENCOUNTER — Inpatient Hospital Stay (HOSPITAL_COMMUNITY): Payer: Medicaid Other | Admitting: Certified Registered Nurse Anesthetist

## 2017-10-17 ENCOUNTER — Other Ambulatory Visit: Payer: Medicaid Other

## 2017-10-17 ENCOUNTER — Encounter (HOSPITAL_COMMUNITY): Payer: Self-pay | Admitting: *Deleted

## 2017-10-17 ENCOUNTER — Ambulatory Visit (HOSPITAL_COMMUNITY): Payer: Medicaid Other

## 2017-10-17 ENCOUNTER — Encounter (HOSPITAL_COMMUNITY)
Admission: AD | Disposition: A | Discharge status: Home Health | Payer: Self-pay | Source: Ambulatory Visit | Attending: Internal Medicine

## 2017-10-17 DIAGNOSIS — I361 Nonrheumatic tricuspid (valve) insufficiency: Secondary | ICD-10-CM

## 2017-10-17 HISTORY — PX: HOT HEMOSTASIS: SHX5433

## 2017-10-17 HISTORY — PX: ESOPHAGOGASTRODUODENOSCOPY (EGD) WITH PROPOFOL: SHX5813

## 2017-10-17 HISTORY — PX: ENTEROSCOPY: SHX5533

## 2017-10-17 LAB — VON WILLEBRAND PANEL
COAGULATION FACTOR VIII: 262 % — AB (ref 56–140)
RISTOCETIN CO-FACTOR, PLASMA: 193 % (ref 50–200)
VON WILLEBRAND ANTIGEN, PLASMA: 194 % (ref 50–200)

## 2017-10-17 LAB — CBC
HCT: 26.4 % — ABNORMAL LOW (ref 36.0–46.0)
Hemoglobin: 8.1 g/dL — ABNORMAL LOW (ref 12.0–15.0)
MCH: 26.6 pg (ref 26.0–34.0)
MCHC: 30.7 g/dL (ref 30.0–36.0)
MCV: 86.6 fL (ref 78.0–100.0)
Platelets: 272 10*3/uL (ref 150–400)
RBC: 3.05 MIL/uL — ABNORMAL LOW (ref 3.87–5.11)
RDW: 18.2 % — ABNORMAL HIGH (ref 11.5–15.5)
WBC: 11.4 10*3/uL — ABNORMAL HIGH (ref 4.0–10.5)

## 2017-10-17 LAB — BASIC METABOLIC PANEL
Anion gap: 6 (ref 5–15)
BUN: 5 mg/dL — ABNORMAL LOW (ref 6–20)
CHLORIDE: 113 mmol/L — AB (ref 98–111)
CO2: 23 mmol/L (ref 22–32)
CREATININE: 0.72 mg/dL (ref 0.44–1.00)
Calcium: 8.3 mg/dL — ABNORMAL LOW (ref 8.9–10.3)
GFR calc non Af Amer: >60 mL/min (ref >60)
Glucose, Bld: 128 mg/dL — ABNORMAL HIGH (ref 70–99)
Potassium: 3.7 mmol/L (ref 3.5–5.1)
SODIUM: 142 mmol/L (ref 135–145)

## 2017-10-17 LAB — COAG STUDIES INTERP REPORT

## 2017-10-17 SURGERY — ESOPHAGOGASTRODUODENOSCOPY (EGD) WITH PROPOFOL
Anesthesia: Monitor Anesthesia Care

## 2017-10-17 MED ORDER — LACTATED RINGERS IV SOLN
INTRAVENOUS | Status: AC | PRN
  Administered 2017-10-17: 1000 mL via INTRAVENOUS

## 2017-10-17 MED ORDER — PROPOFOL 10 MG/ML IV BOLUS
INTRAVENOUS | Status: DC | PRN
  Administered 2017-10-17: 10 mg via INTRAVENOUS
  Administered 2017-10-17: 40 mg via INTRAVENOUS
  Administered 2017-10-17: 60 mg via INTRAVENOUS

## 2017-10-17 MED ORDER — LIDOCAINE HCL (CARDIAC) PF 100 MG/5ML IV SOSY
PREFILLED_SYRINGE | INTRAVENOUS | Status: DC | PRN
  Administered 2017-10-17: 60 mg via INTRAVENOUS

## 2017-10-17 MED ORDER — DIPHENHYDRAMINE HCL 25 MG PO CAPS
25.0000 mg | ORAL_CAPSULE | Freq: Once | ORAL | Status: AC
  Administered 2017-10-17: 25 mg via ORAL
  Filled 2017-10-17: qty 1

## 2017-10-17 MED ORDER — PROPOFOL 500 MG/50ML IV EMUL
INTRAVENOUS | Status: DC | PRN
  Administered 2017-10-17: 100 ug/kg/min via INTRAVENOUS

## 2017-10-17 MED ORDER — NA FERRIC GLUC CPLX IN SUCROSE 12.5 MG/ML IV SOLN
125.0000 mg | Freq: Once | INTRAVENOUS | Status: AC
  Administered 2017-10-17: 125 mg via INTRAVENOUS
  Filled 2017-10-17: qty 10

## 2017-10-17 SURGICAL SUPPLY — 15 items

## 2017-10-17 NOTE — Discharge Summary (Signed)
Name: Peggy House MRN: [id number] DOB: [birth year] 57 y.o. PCP: Einar Gip, DO  Date of Admission: 10/15/2017  6:22 PM Date of Discharge: 10/22/2017 Attending Physician: Bartholomew Crews, MD Discharge Diagnosis: 1. Anemia secondary to acute GI blood loss requiring transfusion x7.  2. Iron deficiency anemia in the setting of HHT and acute GI bleed. 3. HHT 4. Elevated pulmonary artery pressure  Discharge Medications: Allergies as of 10/22/2017      Reactions   Feraheme [ferumoxytol]    Muscle tetany, encephalopathy, fatigue, nausea   Nsaids Other (See Comments)   Stomach bleeding episodes; Tylenol is OK   Tomato Hives   Wasp Venom Swelling      Medication List    STOP taking these medications   acetaminophen 325 MG tablet Commonly known as:  TYLENOL   amLODipine 10 MG tablet Commonly known as:  NORVASC   tranexamic acid 650 MG Tabs tablet Commonly known as:  LYSTEDA     TAKE these medications   albuterol 108 (90 Base) MCG/ACT inhaler Commonly known as:  PROVENTIL HFA;VENTOLIN HFA Inhale 1-2 puffs into the lungs every 6 (six) hours as needed for wheezing or shortness of breath.   aminocaproic acid 500 MG tablet Commonly known as:  AMICAR Take 2 tablets (1,000 mg total) by mouth every 4 (four) hours while awake.   atorvastatin 80 MG tablet Commonly known as:  LIPITOR take 1 tablet by mouth once daily   ferrous gluconate 324 MG tablet Commonly known as:  FERGON Take 1 tablet (324 mg total) by mouth 3 (three) times daily with meals. What changed:  when to take this   pantoprazole 40 MG tablet Commonly known as:  PROTONIX Take 1 tablet (40 mg total) by mouth 2 (two) times daily.   sertraline 50 MG tablet Commonly known as:  ZOLOFT Take 1 tablet (50 mg total) by mouth daily.   tamoxifen 20 MG tablet Commonly known as:  NOLVADEX Take 1 tablet (20 mg total) by mouth daily.       Disposition and follow-up:   Peggy House was discharged from  [facility] in Stable condition.  At the hospital follow up visit please address:   Acute blood loss anemia 2/2 AVMs due to HHT: -s/p 7 units pRBC this admission; transfusion dependent at this time -heme started on tamoxifen 20mg  daily, and amicar 1g qid (every 4 hours while awake); ferrous gluconate TID wc - transfer to [facility] for possible double balloon enteroscopy was attempted but they were unable to accept her during admission; try to attempt outpatient follow-up at [facility]  -ensure she has followed up with Dr. Burr Medico (hematology) outpatient.  Elevated pulmonary artery pressure: -needs outpt w/u with PFTs, VQ scan, sleep study, RH cath for evaluation of elevated pulmonary artery pressure  HTN: -amlodipine held on admission due to active GI bleeding and normotension; previously also on metoprolol which has been held since last admission.  2.  Labs / imaging needed at time of follow-up: CBC 3.  Pending labs/ test needing follow-up: none Follow-up Appointments: Follow-up Information    [place]. Go on 10/24/2017.   Why:  10:45 am.  Contact information: [address] [place] [phone]       Truitt Merle, MD. Schedule an appointment as soon as possible for a visit in 1 week(s).   Specialties:  Hematology, Oncology Contact information: [place] [address] [phone]  Hospital Course by problem list:  1. Acute blood loss anemia 2/2 AVM in setting of HHT Ms. Lia House was readmitted for the 4th time within the last month for recurrent GI bleeding requiring transfusion. She has a presumed diagnosis of HHT for which she has undergone multiple EGDs over the last several weeks; most recently s/p treatment of bleeding gastric AVMs and clipping of duodenal ulcer. She also had an endoscopic capsule placed that showed non bleeding small bowel AVMs. The last two admissions, she was started on  tranexamic acid with only transient suppression of bleeding. When she presented this admission on 7/4, she was sent from heme/onc office with a 3 day history of melena and passing dark red clots. Her hgb was 5.6 in the office. On arrival to the ED she was hypotensive and repeat hgb was 3.7. She responded well to initial transfusion of 3u pRBCs and IVF; hematology started her on tamoxifen 20mg  daily and Amicar 1g QID. Unfortunately, patient continued to have intermittent melena, hematochezia and need for transfusions. She underwent EGD 7/5 and was found to have recently bleeding angioectasias: 2 in the stomach, one in the duodenum, and multiple in the jejunum s/p APC. EGD 7/7 after episode of hematemesis revealed no active bleeding, however there were a few gastric AVMs that were treated with APC. Tagged RBC scan afterwards did not reveal any further ongoing GI bleeding. She was transfused with 7 units of pRBCs this admission. GI initiated transfer to [facility] for double balloon endoscopy, as they suspect she likely has bleeding further down in her small bowel. Unfortunately, they were unable to take her at this time; hopefully, she will be able to follow-up with them as an outpatient to schedule this procedure.   2. Iron deficiency anemia in the setting of HHT and acute GI bleed Recent iron studies on 7/3  revealed iron level of 10, TIBC 229 and saturation ratio of 4; ferritin level 61. It was noted on last admission she had a severe reaction to IV Feraheme. Heme/onc recommended a trial dose of IV ferric gluconate pretreated with Benadryl which she tolerated well. Full dose of IV ferric gluconate 125 mg pretreated with Benadryl was started which she toleratedx1. She was discharged on oral ferrous gluconate TID wc per heme recommendations. Her hematologist plans on continuing intermittent outpatient iron infusions.  3. HHT She has had multiple admissions due to bleeding AVMs. It is recommended that patients with  HHT undergo screening for pulmonary AVMs via bubble echo because of the high incidence of unsuspected pulmonary AVMs and evidence that treatment reduces stroke and brain abscess risk in adults. Her results for this study revealed LVEF of 65-70%, moderate diastolic dysfunction, mild LVH, moderate left atrial enlargement, moderate TR with moderate to severe pulmonary HTN. No evidence of PFO or concern for right-to-left shunt. She will f/u with hematology, Dr. Burr Medico for further management.  4. Elevated pulmonary artery pressure Echocardiogram with bubble study did not mention findings consistent with pulmonary AVMs; it did reveal elevated pulmonary artery pressures (PA peak 82mmHg) and G2DD. LVEF was wnl. Further oupt w/u (PFTs, sleep study, possible V/Q scan (with HHT in setting of recurrent recent bleeding she is at risk for VTE as factors are depleted), RHC) is recommended to identify source of elevated pulmonary artery pressure.    Discharge Vitals:   BP 137/70 (BP Location: Right Arm)   Pulse 60   Temp 98.6 F (37 C) (Oral)   Resp 17   Ht 5' 5.5" (1.664 m)  Wt 257 lb (116.6 kg)   SpO2 100%   BMI 42.12 kg/m   Pertinent Labs, Studies, and Procedures:  CBC Latest Ref Rng & Units 10/22/2017 10/21/2017 10/21/2017  WBC 4.0 - 10.5 K/uL - - -  Hemoglobin 12.0 - 15.0 g/dL 8.9(L) 8.9(L) 9.0(L)  Hematocrit 36.0 - 46.0 % 29.8(L) 29.4(L) 29.2(L)  Platelets 150 - 400 K/uL - - -    Discharge Instructions: Discharge Instructions    Call MD for:  difficulty breathing, headache or visual disturbances   Complete by:  As directed    Call MD for:  extreme fatigue   Complete by:  As directed    Call MD for:  hives   Complete by:  As directed    Call MD for:  persistant dizziness or light-headedness   Complete by:  As directed    Call MD for:  persistant nausea and vomiting   Complete by:  As directed    Call MD for:  redness, tenderness, or signs of infection (pain, swelling, redness, odor or  green/yellow discharge around incision site)   Complete by:  As directed    Call MD for:  severe uncontrolled pain   Complete by:  As directed    Call MD for:  temperature >100.4   Complete by:  As directed    Diet - low sodium heart healthy   Complete by:  As directed    Discharge instructions   Complete by:  As directed    Please ensure you are taking the iron supplement tablet (fergon), Amicar and Tamoxifen. Continue the rest of your home meds except Amlodipine. Your primary care doctor can resume this when they see fit.  You are scheduled for an appointment in our clinic for Friday 7/12 at 10:45.  Please call sooner if you have any more bleeding.   Increase activity slowly   Complete by:  As directed       Signed: Delice Bison, DO 10/23/2017, 3:32 PM   Pager: [phone]

## 2017-10-17 NOTE — Progress Notes (Signed)
Peggy House   DOB:09-Jun-1960   XI#:[id number]   JQB#:[id number]  Hematology follow up  Subjective: Pt had one BM yesterday and one today, both were non-bloody, dark brown. She went EGD this morning, which showed multiple angiectasia and treated with APC. She also received one dose of ferric gluconate today, tolerated very well without any reactions.  She feels much better overall.   Objective:  Vitals:   10/17/17 1130 10/17/17 1942  BP: (!) 164/80 (!) 149/73  Pulse: 66 68  Resp: (!) 21 16  Temp:  98.4 F (36.9 C)  SpO2: 98% 100%    Body mass index is 42.12 kg/m.  Intake/Output Summary (Last 24 hours) at 10/17/2017 2157 Last data filed at 10/17/2017 1500 Gross per 24 hour  Intake 400 ml  Output -  Net 400 ml     Sclerae unicteric  Oropharynx clear, she has a few telangiectasia in her mucosa and tongue, but nothing in her fingertips and skin   No peripheral adenopathy  Lungs clear -- no rales or rhonchi  Heart regular rate and rhythm  Abdomen benign  MSK no focal spinal tenderness, no peripheral edema  Neuro nonfocal   CBG (last 3)  No results for input(s): GLUCAP in the last 72 hours.   Labs:  Lab Results  Component Value Date   WBC 11.4 (H) 10/17/2017   HGB 8.1 (L) 10/17/2017   HCT 26.4 (L) 10/17/2017   MCV 86.6 10/17/2017   PLT 272 10/17/2017   NEUTROABS 9.5 (H) 10/15/2017   CMP Latest Ref Rng & Units 10/17/2017 10/16/2017 10/10/2017  Glucose 70 - 99 mg/dL 128(H) 103(H) 98  BUN 6 - 20 mg/dL 5(L) 7 <5(L)  Creatinine 0.44 - 1.00 mg/dL 0.72 0.80 0.73  Sodium 135 - 145 mmol/L 142 143 144  Potassium 3.5 - 5.1 mmol/L 3.7 3.8 3.6  Chloride 98 - 111 mmol/L 113(H) 115(H) 113(H)  CO2 22 - 32 mmol/L 23 24 26   Calcium 8.9 - 10.3 mg/dL 8.3(L) 7.9(L) 8.2(L)  Total Protein 6.5 - 8.1 g/dL - - -  Total Bilirubin 0.3 - 1.2 mg/dL - - -  Alkaline Phos 38 - 126 U/L - - -  AST 15 - 41 U/L - - -  ALT 14 - 54 U/L - - -     Urine Studies No results for input(s): UHGB, CRYS in the  last 72 hours.  Invalid input(s): UACOL, UAPR, USPG, UPH, UTP, UGL, UKET, UBIL, UNIT, UROB, ULEU, UEPI, UWBC, URBC, UBAC, CAST, UCOM, BILUA  Basic Metabolic Panel: Recent Labs  Lab 10/16/17 0805 10/17/17 0444  NA 143 142  K 3.8 3.7  CL 115* 113*  CO2 24 23  GLUCOSE 103* 128*  BUN 7 5*  CREATININE 0.80 0.72  CALCIUM 7.9* 8.3*   GFR Estimated Creatinine Clearance: 101.2 mL/min (by C-G formula based on SCr of 0.72 mg/dL). Liver Function Tests: No results for input(s): AST, ALT, ALKPHOS, BILITOT, PROT, ALBUMIN in the last 168 hours. No results for input(s): LIPASE, AMYLASE in the last 168 hours. No results for input(s): AMMONIA in the last 168 hours. Coagulation profile Recent Labs  Lab 10/16/17 0925  INR 1.16    CBC: Recent Labs  Lab 10/15/17 1116 10/15/17 2027 10/16/17 0805 10/16/17 0925 10/16/17 1537 10/17/17 0444  WBC 11.9* 9.9 11.1*  --   --  11.4*  NEUTROABS 9.5*  --   --   --   --   --   HGB 5.6* 3.7* 7.4*  --  8.3* 8.1*  HCT 17.7* 12.8* 23.1*  --  25.9* 26.4*  MCV 85.3 93.4 86.2  --   --  86.6  PLT 332 206 237 264  --  272   Cardiac Enzymes: No results for input(s): CKTOTAL, CKMB, CKMBINDEX, TROPONINI in the last 168 hours. BNP: Invalid input(s): POCBNP CBG: No results for input(s): GLUCAP in the last 168 hours. D-Dimer Recent Labs    10/16/17 0925  DDIMER 0.75*   Hgb A1c No results for input(s): HGBA1C in the last 72 hours. Lipid Profile No results for input(s): CHOL, HDL, LDLCALC, TRIG, CHOLHDL, LDLDIRECT in the last 72 hours. Thyroid function studies No results for input(s): TSH, T4TOTAL, T3FREE, THYROIDAB in the last 72 hours.  Invalid input(s): FREET3 Anemia work up Recent Labs    10/15/17 1116  FERRITIN 61  TIBC 229*  IRON 10*  RETICCTPCT 8.6*   Microbiology No results found for this or any previous visit (from the past 240 hour(s)).    Studies:  No results found.  Assessment: 57 y.o. with chronic recurrent epistaxis since  childhood, recurrent GI bleeding with AVMs  1. 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  Severe anemia from GI bleeding and iron deficiency 4. HTN   Plan:  -she is doing much better with blood transfusion, iv ferric gluconate, her GI bleeding has improved after Amicar and endoscopy APC, although she is at high risk for recurrent GI bleeding -VWD was negative, DIC was also negative  -her H/H has been stable since yesterday, if repeated H/H stable tomorrow and no more over GI bleeding, OK to discharge from my standpoint. I will get her in on Monday or Tuesday for repeated CBC, then lab, f/u and iv iron next Friday -she agrees with closely monitoring in my clinic, I will repeat CBC weekly for a month then space out if no recurrent bleeding  -please discharge with Amicar 1g 4 times a day for one week, then I will gradually decrease to twice daily  -continue tamoxifen  -please call me if you have questions, I can be reached at my cell [phone].   Truitt Merle, MD 10/17/2017  9:57 PM

## 2017-10-17 NOTE — Transfer of Care (Signed)
Immediate Anesthesia Transfer of Care Note  Patient: Peggy House  Procedure(s) Performed: ESOPHAGOGASTRODUODENOSCOPY (EGD) WITH PROPOFOL (enteroscopy) (N/A ) HOT HEMOSTASIS (ARGON PLASMA COAGULATION/BICAP) (N/A )  Patient Location: Endoscopy Unit  Anesthesia Type:MAC  Level of Consciousness: awake and alert   Airway & Oxygen Therapy: Patient Spontanous Breathing and Patient connected to nasal cannula oxygen  Post-op Assessment: Report given to RN and Post -op Vital signs reviewed and stable  Post vital signs: Reviewed and stable  Last Vitals:  Vitals Value Taken Time  BP    Temp    Pulse 73 10/17/2017 11:22 AM  Resp 22 10/17/2017 11:22 AM  SpO2 100 % 10/17/2017 11:22 AM  Vitals shown include unvalidated device data.  Last Pain:  Vitals:   10/17/17 1010  TempSrc: Oral  PainSc: 0-No pain         Complications: No apparent anesthesia complications

## 2017-10-17 NOTE — Interval H&P Note (Signed)
History and Physical Interval Note:  10/17/2017 10:25 AM  Peggy House  has presented today for surgery, with the diagnosis of gi bleed  The various methods of treatment have been discussed with the patient and family. After consideration of risks, benefits and other options for treatment, the patient has consented to  Procedure(s): ESOPHAGOGASTRODUODENOSCOPY (EGD) WITH PROPOFOL (enteroscopy) (N/A) as a surgical intervention .  The patient's history has been reviewed, patient examined, no change in status, stable for surgery.  I have reviewed the patient's chart and labs.  Questions were answered to the patient's satisfaction.    Risks (bleeding, infection, bowel perforation that could require surgery, sedation-related changes in cardiopulmonary systems), benefits (identification and possible treatment of source of symptoms, exclusion of certain causes of symptoms), and alternatives (watchful waiting, radiographic imaging studies, empiric medical treatment)  were explained to patient in detail and patient wishes to proceed.  Britny Riel

## 2017-10-17 NOTE — Progress Notes (Signed)
  Echocardiogram 2D Echocardiogram has been performed.  Peggy House 10/17/2017, 3:14 PM

## 2017-10-17 NOTE — Op Note (Signed)
[facility] Patient Name: Peggy House Procedure Date : 10/17/2017 MRN: [id number] Attending MD: Otis Brace , MD Date of Birth: 04/29/1960 CSN: [id number] Age: 57 Admit Type: Inpatient Procedure:                Upper GI endoscopy Indications:              Melena, Suspected upper gastrointestinal bleeding                            in patient with chronic blood loss Providers:                Otis Brace, MD, Baird Cancer, RN, Elspeth Cho Tech., Technician, Trixie Deis, CRNA Referring MD:              Medicines:                Sedation Administered by an Anesthesia Professional Complications:            No immediate complications. Estimated Blood Loss:     Estimated blood loss was minimal. Procedure:                Pre-Anesthesia Assessment:                           - Prior to the procedure, a History and Physical                            was performed, and patient medications and                            allergies were reviewed. The patient's tolerance of                            previous anesthesia was also reviewed. The risks                            and benefits of the procedure and the sedation                            options and risks were discussed with the patient.                            All questions were answered, and informed consent                            was obtained. Prior Anticoagulants: The patient has                            taken no previous anticoagulant or antiplatelet                            agents. ASA Grade Assessment: III - A patient with  severe systemic disease. After reviewing the risks                            and benefits, the patient was deemed in                            satisfactory condition to undergo the procedure.                           After obtaining informed consent, the endoscope was                            passed under direct vision.  Throughout the                            procedure, the patient's blood pressure, pulse, and                            oxygen saturations were monitored continuously. The                            Enteroscope was introduced through the mouth, and                            advanced to the proximal jejunum up tp 120 cm.. The                            upper GI endoscopy was accomplished without                            difficulty. The patient tolerated the procedure                            well. Scope In: Scope Out: Findings:      The Z-line was regular and was found 38 cm from the incisors.      One medium stigmata of recent bleeding angioectasia was found in the       cardia. Fulguration to ablate the lesion to prevent bleeding by argon       plasma at 1 liter/minute and 15 watts was successful.      One large stigmata of recent bleeding angioectasia was found in the       gastric body. Fulguration to ablate the lesion to prevent bleeding by       argon plasma at 1 liter/minute and 20 watts was successful.      The cardia and gastric fundus were normal on retroflexion.      One medium angioectasia with bleeding on contact was found in the       duodenal bulb. Fulguration to ablate the lesion to prevent bleeding by       argon plasma at 1 liter/minute and 20 watts was successful.      Multiple angioectasias with bleeding on contact were found in the       jejunum. Fulguration to ablate the lesion to prevent bleeding by argon       plasma at 1 liter/minute and 20 watts  was successful. Impression:               - Z-line regular, 38 cm from the incisors.                           - One recently bleeding angioectasia in the                            stomach. Treated with argon plasma coagulation                            (APC).                           - One recently bleeding angioectasia in the                            stomach. Treated with argon plasma coagulation                             (APC).                           - One angioectasia in the duodenum. Treated with                            argon plasma coagulation (APC).                           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 Treated                            with argon plasma coagulation (APC).                           - No specimens collected. Recommendation:           - Return patient to hospital ward for ongoing care.                           - Full liquid diet.                           - Continue present medications. Procedure Code(s):        --- Professional ---                           [phone], Esophagogastroduodenoscopy, flexible,                            transoral; with control of bleeding, any method Diagnosis Code(s):        --- Professional ---                           B28.413, Angiodysplasia of stomach and duodenum  with bleeding                           K55.20, Angiodysplasia of colon without hemorrhage                           K92.1, Melena (includes Hematochezia)                           R58, Hemorrhage, not elsewhere classified CPT copyright 2017 American [employer] Association. All rights reserved. The codes documented in this report are preliminary and upon coder review may  be revised to meet current compliance requirements. Otis Brace, MD Otis Brace, MD 10/17/2017 11:26:11 AM Number of Addenda: 0

## 2017-10-17 NOTE — Anesthesia Preprocedure Evaluation (Addendum)
Anesthesia Evaluation  Patient identified by MRN, date of birth, ID band Patient awake    Reviewed: Allergy & Precautions, NPO status , Patient's Chart, lab work & pertinent test results  Airway Mallampati: II  TM Distance: >3 FB Neck ROM: Full    Dental no notable dental hx. (+) Loose, Poor Dentition, Missing,    Pulmonary Current Smoker,    Pulmonary exam normal breath sounds clear to auscultation       Cardiovascular hypertension, Pt. on medications Normal cardiovascular exam Rhythm:Regular Rate:Normal     Neuro/Psych Anxiety Depression negative neurological ROS     GI/Hepatic Neg liver ROS, GERD  ,  Endo/Other  negative endocrine ROS  Renal/GU negative Renal ROS  negative genitourinary   Musculoskeletal negative musculoskeletal ROS (+)   Abdominal   Peds negative pediatric ROS (+)  Hematology negative hematology ROS (+)   Anesthesia Other Findings   Reproductive/Obstetrics negative OB ROS                                                            Anesthesia Evaluation  Patient identified by MRN, date of birth, ID band Patient awake    Reviewed: Allergy & Precautions, NPO status , Patient's Chart, lab work & pertinent test results  Airway Mallampati: II  TM Distance: >3 FB Neck ROM: Full    Dental  (+) Poor Dentition, Chipped, Missing, Loose, Dental Advisory Given,    Pulmonary Current Smoker,    Pulmonary exam normal breath sounds clear to auscultation       Cardiovascular hypertension, Normal cardiovascular exam Rhythm:Regular Rate:Normal     Neuro/Psych negative neurological ROS  negative psych ROS   GI/Hepatic negative GI ROS, Neg liver ROS,   Endo/Other  Morbid obesity  Renal/GU negative Renal ROS  negative genitourinary   Musculoskeletal negative musculoskeletal ROS (+)   Abdominal   Peds negative pediatric ROS (+)  Hematology  (+)  anemia ,   Anesthesia Other Findings   Reproductive/Obstetrics negative OB ROS                            Anesthesia Physical Anesthesia Plan  ASA: III  Anesthesia Plan: MAC   Post-op Pain Management:    Induction: Intravenous  PONV Risk Score and Plan: 0  Airway Management Planned: Simple Face Mask  Additional Equipment:   Intra-op Plan:   Post-operative Plan:   Informed Consent: I have reviewed the patients History and Physical, chart, labs and discussed the procedure including the risks, benefits and alternatives for the proposed anesthesia with the patient or authorized representative who has indicated his/her understanding and acceptance.   Dental advisory given  Plan Discussed with: CRNA and Surgeon  Anesthesia Plan Comments:         Anesthesia Quick Evaluation  Anesthesia Physical Anesthesia Plan  ASA: III  Anesthesia Plan: MAC   Post-op Pain Management:    Induction:   PONV Risk Score and Plan: 1 and Ondansetron and Treatment may vary due to age or medical condition  Airway Management Planned: Nasal Cannula  Additional Equipment:   Intra-op Plan:   Post-operative Plan:   Informed Consent: I have reviewed the patients History and Physical, chart, labs and discussed the procedure including the risks, benefits and alternatives for  the proposed anesthesia with the patient or authorized representative who has indicated his/her understanding and acceptance.   Dental advisory given  Plan Discussed with: Anesthesiologist  Anesthesia Plan Comments:        Anesthesia Quick Evaluation

## 2017-10-17 NOTE — Anesthesia Procedure Notes (Signed)
Procedure Name: MAC Date/Time: 10/17/2017 10:36 AM Performed by: Leonor Liv, CRNA Oxygen Delivery Method: Nasal cannula Airway Equipment and Method: Bite block Placement Confirmation: positive ETCO2

## 2017-10-17 NOTE — Progress Notes (Signed)
  Date: 10/17/2017  Patient name: Peggy House  Medical record number: [id number]  Date of birth: [birth year]   I have seen and evaluated this patient and I have discussed the plan of care with the house staff. Please see their note for complete details. I concur with their findings with the following additions/corrections: [name] was seen on AM rounds. She continues to feel more "pep" compared to admission. HgB stable at 8 on Amicar and tamoxifen. S/P EGD and had 2 stomach angioectaisas, one in duodenum, and multiple in jejunum. All were ablated. Will need to follow HgB. Also getting bubble ECHO to assess for pul AVM as it is rec all pts with HHT get screened. Pt also has hepatic venous congestion on ABD CT along with changes on TTE suggestive of pul HTN - these will need F/U as outpt.  Bartholomew Crews, MD 10/17/2017, 12:20 PM

## 2017-10-17 NOTE — Progress Notes (Signed)
   Subjective: Patient seen and examined at bedside. No acute events overnight. She did have another episode of melena. Stool was formed. Denies abdominal pain, bright red blood per rectum, chest pain, palpitations, lightheadedness. NPO for EGD today.   Objective:  Vital signs in last 24 hours: Vitals:   10/17/17 0613 10/17/17 1010 10/17/17 1121 10/17/17 1130  BP: (!) 112/51 (!) 163/63 (!) 159/82 (!) 164/80  Pulse: 67  73 66  Resp: 17 13 (!) 22 (!) 21  Temp: 98.1 F (36.7 C) 98.4 F (36.9 C) 97.6 F (36.4 C)   TempSrc:  Oral Oral   SpO2: 97% 100% 100% 98%  Weight:  257 lb (116.6 kg)    Height:  5' 5.5" (1.664 m)     General: awake, alert, very pleasant female lying in bed in NAD.  Cardiovascular: RRR. SEM heard best at upper sternal borders.  Respiratory: No increased work of breathing. Lungs CTA throughout.  Abdomen: soft, non-tender, Bowel sounds + Extremities: no bilateral upper extremity edema.   Assessment/Plan:  Active Problems:   GI bleed  1. Anemia secondary to acute GI blood loss s/p transfusion x3 units on 7/4.  - underwent EGD this morning. She had two stomach angioectasias, one in the duodenum, and multiple in the jejunum. All were successfully ablated.  - will follow Hgb; 8.1 this morning prior to procedure - On Tamoxifen and increased Amicar dose per oncology   2. Iron deficiency anemia in the setting of HHT with acute GI blood loss  - Per oncology recommendations, had a trial dose of IV ferric gluconate pretreated with Benadryl last night given her reaction to feraheme last admission. She tolerated that well and has now been started on 125 mg ferric gluconate with benadryl 30 minutes prior to infusion.   3. HHT: Upon further reading, it is recommended that patients with HHT be screened for pulmonary AVMs via bubble ECHO so that has been ordered.   4. HTN: holding amlodipine 10 mg based on hypotension at admission. Blood pressures have been stable.   5. HLD:  continue Atorvastatin 40 mg.   5. MDD: continue Sertraline 50 mg.      Dispo: Anticipated discharge in approximately 1-2 day(s).   Modena Nunnery D, DO 10/17/2017, 12:40 PM Pager: [phone]

## 2017-10-18 ENCOUNTER — Inpatient Hospital Stay: Payer: Medicaid Other

## 2017-10-18 DIAGNOSIS — D62 Acute posthemorrhagic anemia: Secondary | ICD-10-CM

## 2017-10-18 LAB — PREPARE RBC (CROSSMATCH)

## 2017-10-18 LAB — CBC
HCT: 21.7 % — ABNORMAL LOW (ref 36.0–46.0)
HCT: 19.2 % — ABNORMAL LOW (ref 36.0–46.0)
HEMOGLOBIN: 5.9 g/dL — AB (ref 12.0–15.0)
Hemoglobin: 6.4 g/dL — CL (ref 12.0–15.0)
MCH: 26.7 pg (ref 26.0–34.0)
MCH: 28.1 pg (ref 26.0–34.0)
MCHC: 29.5 g/dL — ABNORMAL LOW (ref 30.0–36.0)
MCHC: 30.7 g/dL (ref 30.0–36.0)
MCV: 90.4 fL (ref 78.0–100.0)
MCV: 91.4 fL (ref 78.0–100.0)
Platelets: 272 10*3/uL (ref 150–400)
Platelets: 220 10*3/uL (ref 150–400)
RBC: 2.4 MIL/uL — ABNORMAL LOW (ref 3.87–5.11)
RBC: 2.1 MIL/uL — ABNORMAL LOW (ref 3.87–5.11)
RDW: 18.5 % — AB (ref 11.5–15.5)
RDW: 18.1 % — ABNORMAL HIGH (ref 11.5–15.5)
WBC: 15.1 10*3/uL — AB (ref 4.0–10.5)
WBC: 17.2 10*3/uL — ABNORMAL HIGH (ref 4.0–10.5)

## 2017-10-18 LAB — HEMOGLOBIN AND HEMATOCRIT, BLOOD
HCT: 28.2 % — ABNORMAL LOW (ref 36.0–46.0)
Hemoglobin: 8.5 g/dL — ABNORMAL LOW (ref 12.0–15.0)

## 2017-10-18 MED ORDER — SODIUM CHLORIDE 0.9% IV SOLUTION: Freq: Once | INTRAVENOUS | Status: DC

## 2017-10-18 MED ORDER — SODIUM CHLORIDE 0.9 % IV BOLUS
1000.0000 mL | Freq: Once | INTRAVENOUS | Status: AC
  Administered 2017-10-18: 1000 mL via INTRAVENOUS

## 2017-10-18 MED ORDER — SODIUM CHLORIDE 0.9% IV SOLUTION
Freq: Once | INTRAVENOUS | Status: AC
  Administered 2017-10-18: 08:00:00 via INTRAVENOUS

## 2017-10-18 NOTE — Progress Notes (Signed)
   Subjective:  Patient states she continues to improve. She denies chest pain, shortness of breath, headache, abdominal pain, N/V. Her last BM was yesterday afternoon and was brown without hematochezia.  Objective:  Vital signs in last 24 hours: Vitals:   10/18/17 0631 10/18/17 0733 10/18/17 0832 10/18/17 1125  BP: 133/65 (!) 132/59 136/62 (!) 141/71  Pulse: 62 65 65 68  Resp: 18 18 20  (!) 22  Temp: 98.7 F (37.1 C) 98.7 F (37.1 C) 98.5 F (36.9 C) 98.2 F (36.8 C)  TempSrc: Oral Oral Oral Oral  SpO2: 99% 100% 100% 99%  Weight:      Height:       Constitutional: NAD, pleasant, eating breakfast CV: RRR, systolic ejection murmur, no rubs or gallops Resp: CTAB, no increased work of breathing Abd: soft, NDNT, +BS Ext: warm, no LE edema  Assessment/Plan:  Active Problems:   GI bleed  Acute blood loss anemia 2/2 GI bleed in setting of likely HHT Iron deficiency anemia: S/p EGD 7/5 which revealed stomachx2, duodenalx1 and multiple duodenal angiectasias s/p ablasion. Hgb 8.1>6.4 today w/o reports of bloody BMs. She is s/p 3u pRBCs and 2x doses of IV ferric gluconate so far this admission. DIC panel was negative and work up for VWD was negative. No mention of pulmonary AVMs on bubble Echo. --give 1u pRBCs; f/u post transfusion h/h --Continue tamoxifen and amicar per heme --will get further ferric gluconate as outpt with Heme --if still unable to control bleeding and maintain Hgb in the coming days, GI recommends transfer to tertiary care center for double balloon enteroscopy  Elevated pulmonary artery pressure: Echocardiogram with bubble study did not mention findings consistent with pulmonary AVMs; it did reveal elevated pulmonary artery pressures and G2DD. LVEF was wnl. Further oupt w/u (PFTs, sleep study, possible V/Q scan (with HHT in setting of recurrent recent bleeding she is at risk for VTE as factors are depleted), RHC) is recommended to identify source of elevated  pulmonary artery pressure.   HTN: Continue holding amlodipine for now.  HLD: Continue atorvastatin 40mg  daily  MDD: Continue sertraline 50mg  daily.   Dispo: Anticipated discharge in approximately 1-2 day(s).   Alphonzo Grieve, MD 10/18/2017, 11:55 AM IMTS - PGY3 Pager [phone]

## 2017-10-18 NOTE — Progress Notes (Signed)
CRITICAL VALUE ALERT  Critical Value:  Hemoglobin 6.4  Date & Time Notied:  10/18/17 at 0707  Provider Notified: Yes  Orders Received/Actions taken: Placed order for 1 unit of RBC to be transfused.

## 2017-10-18 NOTE — Care Management Note (Signed)
Case Management Note  Patient Details  Name: Peggy House MRN: [id number] Date of Birth: 1960/05/13  Subjective/Objective:                 Patient from home w spouse. Active w [facility] for PT RN. Has had 5 admissions in the last 6 months. Will continue to  Follow for DC planning   Action/Plan:   Expected Discharge Date:  10/18/17               Expected Discharge Plan:  [place]  In-House Referral:     Discharge planning Services  CM Consult  Post Acute Care Choice:  Home Health Choice offered to:     DME Arranged:    DME Agency:  [place]:    [facility] Agency:     Status of Service:  In process, will continue to follow  If discussed at Long Length of Stay Meetings, dates discussed:    Additional Comments:  Peggy Collet, RN 10/18/2017, 2:18 PM

## 2017-10-18 NOTE — Progress Notes (Signed)
[facility] Gastroenterology Progress Note  Peggy House 57 y.o. [birth year]  CC:  Recurrent GI bleed   Subjective: no acute events overnight.she denied any further bleeding episode.  Denied nausea or vomiting. Denied abdominal pain. Having normal-colored bowel movement. It is post push enteroscopy with treatment of multiple AVMs with APC yesterday.  ROS : negative for chest pain and shortness of breath.   Objective: Vital signs in last 24 hours: Vitals:   10/18/17 0832 10/18/17 1125  BP: 136/62 (!) 141/71  Pulse: 65 68  Resp: 20 (!) 22  Temp: 98.5 F (36.9 C) 98.2 F (36.8 C)  SpO2: 100% 99%    Physical Exam:  General:  Alert, cooperative, no distress, appears stated age  Head:  Normocephalic, without obvious abnormality, atraumatic  Eyes:  , EOM's intact,   Lungs:   Clear to auscultation bilaterally, respirations unlabored  Heart:  Regular rate and rhythm, murmur present  Abdomen:   Soft, non-tender, bowel sounds present, nondistended, no peritoneal signs          Lab Results: Recent Labs    10/16/17 0805 10/17/17 0444  NA 143 142  K 3.8 3.7  CL 115* 113*  CO2 24 23  GLUCOSE 103* 128*  BUN 7 5*  CREATININE 0.80 0.72  CALCIUM 7.9* 8.3*   No results for input(s): AST, ALT, ALKPHOS, BILITOT, PROT, ALBUMIN in the last 72 hours. Recent Labs    10/17/17 0444 10/18/17 0603  WBC 11.4* 15.1*  HGB 8.1* 6.4*  HCT 26.4* 21.7*  MCV 86.6 90.4  PLT 272 272   Recent Labs    10/16/17 0925  LABPROT 14.7  INR 1.16      Assessment/Plan: - recurrent GI bleed. Multiple EGDs in the past. Push enteroscopy yesterday showed multiple AVMs in the duodenum as well as proximal jejunum which were treated with APC. - Acute blood loss anemia.  - recurrent epistaxis   Recommendations -------------------------- - drop in hemoglobin noted but no further melena. - Monitor H&H. Transfuse as needed to keep hemoglobin around 8. - if ongoing drop in hemoglobin, recommend transfer  to tertiary care center for double balloon enteroscopy - Continue soft diet. GI will follow   - Capsule endoscopy 06/26  showed rapid transit of capsule through the small bowel within 2 hours. Patient was having active nosebleed and there was some blood seen trickling down from nasopharynx into the esophagus and subsequently into the small bowel. During capsule endoscopy, there was evidence of fresh blood at 1 hour and 24 minutes (most likely distal jejunum/proximal ileum)  which I believe was trickled down blood from her active epistaxis at the time of procedure. I did not see any evidence of bleeding proximal to or distal to that area. She also had few tiny  AVMs in the proximal small bowel.    Otis Brace MD, [place] 10/18/2017, 12:13 PM  Contact #  [phone]

## 2017-10-18 NOTE — Progress Notes (Addendum)
Progress Note  Paged at 8pm about hememtesis and hematochezia by pt's RN. Went to the room to find pt on the commode . Found clotted clumps of blood on the floor which pt states she threw up while going to the bathroom. She states her last meal was 1pm and she did not have her dinner yet. Her dinner tray is untouched by bedside. In the commode show large amount of dark, tarry stool. No obvious frank blood. Denies any light-headedness, chills, vertigo, nausea, abdominal pain. States she feels like she needs to 'lie down'  Of note, pt had EGD yesterday w/ multiple bleeding AVMs which were cauterized. Last hemoglobin was 8.5 at 13:45 Received a unit of blood this morning  Vitals: Initial BP show 86/57 Subsequent manual BP show 104/62 Pulse: 74 SpO2: 100 on RA  Physical Exam: Gen: Obese AAF in distress, diaphoretic CV: Regular rate and rhythm. Peripheral pulses present Abd: Soft, Non-distended, Bowel sounds present, No tenderness to palpation  Plan: - Stat CBC and will transfuse if hgb<8 - Started IV normal saline bolus 1L - Keep NPO  Addendum: - CBC show hgb at 5.9 - started 2 units of RBC - Will f/u post transfusion CBC

## 2017-10-19 ENCOUNTER — Encounter (HOSPITAL_COMMUNITY): Payer: Self-pay

## 2017-10-19 ENCOUNTER — Inpatient Hospital Stay (HOSPITAL_COMMUNITY): Payer: Medicaid Other | Admitting: Certified Registered Nurse Anesthetist

## 2017-10-19 ENCOUNTER — Inpatient Hospital Stay (HOSPITAL_COMMUNITY): Payer: Medicaid Other

## 2017-10-19 ENCOUNTER — Encounter (HOSPITAL_COMMUNITY)
Admission: AD | Disposition: A | Discharge status: Home Health | Payer: Self-pay | Source: Ambulatory Visit | Attending: Internal Medicine

## 2017-10-19 DIAGNOSIS — I2721 Secondary pulmonary arterial hypertension: Secondary | ICD-10-CM

## 2017-10-19 DIAGNOSIS — D509 Iron deficiency anemia, unspecified: Secondary | ICD-10-CM

## 2017-10-19 DIAGNOSIS — K920 Hematemesis: Secondary | ICD-10-CM

## 2017-10-19 HISTORY — PX: ESOPHAGOGASTRODUODENOSCOPY (EGD) WITH PROPOFOL: SHX5813

## 2017-10-19 HISTORY — PX: HOT HEMOSTASIS: SHX5433

## 2017-10-19 LAB — BPAM RBC
Blood Product Expiration Date: 201907112359
Blood Product Expiration Date: 201907132359
Blood Product Expiration Date: 201907192359
Blood Product Expiration Date: 201907202359
Blood Product Expiration Date: 201907202359
Blood Product Expiration Date: 201907242359
ISSUE DATE / TIME: 201907032209
ISSUE DATE / TIME: 201907040033
ISSUE DATE / TIME: 201907040326
ISSUE DATE / TIME: 201907062304
ISSUE DATE / TIME: 201907060746
Unit Type and Rh: 600
Unit Type and Rh: 600
Unit Type and Rh: 600
Unit Type and Rh: 600
Unit Type and Rh: 600
Unit Type and Rh: 600

## 2017-10-19 LAB — TYPE AND SCREEN
ABO/RH(D): A NEG
Antibody Screen: NEGATIVE
UNIT DIVISION: 0
UNIT DIVISION: 0
UNIT DIVISION: 0
Unit division: 0
Unit division: 0
Unit division: 0

## 2017-10-19 LAB — PREPARE RBC (CROSSMATCH)

## 2017-10-19 LAB — CBC
HEMATOCRIT: 25.5 % — AB (ref 36.0–46.0)
Hemoglobin: 7.8 g/dL — ABNORMAL LOW (ref 12.0–15.0)
MCH: 27.9 pg (ref 26.0–34.0)
MCHC: 30.6 g/dL (ref 30.0–36.0)
MCV: 91.1 fL (ref 78.0–100.0)
Platelets: 228 10*3/uL (ref 150–400)
RBC: 2.8 MIL/uL — ABNORMAL LOW (ref 3.87–5.11)
RDW: 17.4 % — AB (ref 11.5–15.5)
WBC: 15.1 10*3/uL — AB (ref 4.0–10.5)

## 2017-10-19 LAB — HEMOGLOBIN AND HEMATOCRIT, BLOOD
HCT: 25.5 % — ABNORMAL LOW (ref 36.0–46.0)
HCT: 25.3 % — ABNORMAL LOW (ref 36.0–46.0)
HEMOGLOBIN: 7.8 g/dL — AB (ref 12.0–15.0)
Hemoglobin: 8 g/dL — ABNORMAL LOW (ref 12.0–15.0)

## 2017-10-19 SURGERY — ESOPHAGOGASTRODUODENOSCOPY (EGD) WITH PROPOFOL
Anesthesia: Monitor Anesthesia Care

## 2017-10-19 MED ORDER — PROPOFOL 500 MG/50ML IV EMUL
INTRAVENOUS | Status: DC | PRN
  Administered 2017-10-19: 100 ug/kg/min via INTRAVENOUS

## 2017-10-19 MED ORDER — LIDOCAINE 2% (20 MG/ML) 5 ML SYRINGE
INTRAMUSCULAR | Status: DC | PRN
  Administered 2017-10-19: 60 mg via INTRAVENOUS

## 2017-10-19 MED ORDER — SODIUM CHLORIDE 0.9 % IV SOLN: INTRAVENOUS | Status: DC

## 2017-10-19 MED ORDER — SODIUM CHLORIDE 0.9 % IV BOLUS
1000.0000 mL | Freq: Once | INTRAVENOUS | Status: AC
  Administered 2017-10-19: 1000 mL via INTRAVENOUS

## 2017-10-19 MED ORDER — TECHNETIUM TC 99M-LABELED RED BLOOD CELLS IV KIT
27.5000 | PACK | Freq: Once | INTRAVENOUS | Status: AC | PRN
  Administered 2017-10-19: 27.5 via INTRAVENOUS

## 2017-10-19 MED ORDER — SODIUM CHLORIDE 0.9% IV SOLUTION: Freq: Once | INTRAVENOUS | Status: DC

## 2017-10-19 MED ORDER — LACTATED RINGERS IV SOLN
INTRAVENOUS | Status: DC | PRN
  Administered 2017-10-19: 12:00:00 via INTRAVENOUS

## 2017-10-19 MED ORDER — PROPOFOL 10 MG/ML IV BOLUS
INTRAVENOUS | Status: DC | PRN
  Administered 2017-10-19 (×4): 20 mg via INTRAVENOUS

## 2017-10-19 SURGICAL SUPPLY — 15 items

## 2017-10-19 NOTE — Anesthesia Procedure Notes (Signed)
Procedure Name: MAC Date/Time: 10/19/2017 12:07 PM Performed by: Candis Shine, CRNA Pre-anesthesia Checklist: Patient identified, Emergency Drugs available, Suction available, Patient being monitored and Timeout performed Patient Re-evaluated:Patient Re-evaluated prior to induction Oxygen Delivery Method: Nasal cannula Airway Equipment and Method: Bite block Dental Injury: Teeth and Oropharynx as per pre-operative assessment

## 2017-10-19 NOTE — Progress Notes (Signed)
   Subjective:  Overnight, patient had episode of hematemesis and dark tarry stool; Hgb dropped to 5.7; she was transfused 2u pRBC with improvement of Hgb to 7.8. GI saw her overnight and performed EGD this morning.  This afternoon she states that she feels better; she has no nausea, vomiting, abd pain, chest pain, shortness of breath. She had a dark red bowel movement earlier today but nothing since. We discussed the EGD and tagged red cell studies and that GI is reaching out to tertiary care centers for possible transfer.   She is looking forward to being able to go home eventually; we discussed that it won't be today and that we will take it one day at a time.  Objective:  Vital signs in last 24 hours: Vitals:   10/19/17 1159 10/19/17 1233 10/19/17 1240 10/19/17 1248  BP: (!) 146/87 140/72 139/68 137/69  Pulse: 70 84 70 73  Resp: 12 18 18 16   Temp: 98.5 F (36.9 C) 99.1 F (37.3 C)    TempSrc: Oral Oral    SpO2: 100% 98% 98% 98%  Weight:      Height:       Constitutional: NAD, pleasant CV: RRR, systolic ejection murmur, no rubs or gallops Resp: CTAB, no increased work of breathing Abd: soft, NDNT, +BS Ext: warm, no LE edema  Assessment/Plan:  Principal Problem:   GI bleed Active Problems:   Iron deficiency anemia   Hereditary hemorrhagic telangiectasia (HCC)   Pulmonary artery hypertension (HCC)  Acute blood loss anemia 2/2 GI bleed in setting of likely HHT Iron deficiency anemia: S/p EGD 7/5 which revealed stomachx2, duodenalx1 and multiple duodenal angiectasias s/p APC; EGD 7/7 showed multiple AVMs s/p APC. GI has initiated contact for possible transfer to tertiary care center. DIC panel was negative and work up for VWD was negative. No mention of pulmonary AVMs on bubble Echo. Tagged RBC scan does not show evidence of acute ongoing GI bleeding 7/7. She is s/p 6u pRBCs and 2x doses of IV ferric gluconate so far this admission. --Continue tamoxifen and amicar per  heme --H/H q8rh; transfuse to keep ~8 --will get further ferric gluconate as outpt with Heme --possible transfer to tertiary care center for double balloon enterescopy  Elevated pulmonary artery pressure: Echocardiogram with bubble study did not mention findings consistent with pulmonary AVMs; it did reveal elevated pulmonary artery pressures and G2DD. LVEF was wnl. Further oupt w/u (PFTs, sleep study, possible V/Q scan (with HHT in setting of recurrent recent bleeding she is at risk for VTE as factors are depleted), RHC) is recommended to identify source of elevated pulmonary artery pressure.   HTN: Continue holding amlodipine for now.  HLD: Continue atorvastatin 40mg  daily  MDD: Continue sertraline 50mg  daily.   Dispo: Anticipated discharge in approximately 2-3 day(s); possible transfer to tertiary care center for double balloon enteroscopy.   Alphonzo Grieve, MD 10/19/2017, 1:33 PM IMTS - PGY3 Pager [phone]

## 2017-10-19 NOTE — Anesthesia Postprocedure Evaluation (Signed)
Anesthesia Post Note  Patient: Peggy House  Procedure(s) Performed: ESOPHAGOGASTRODUODENOSCOPY (EGD) WITH PROPOFOL (N/A ) HOT HEMOSTASIS (ARGON PLASMA COAGULATION/BICAP) (N/A )     Patient location during evaluation: Endoscopy Anesthesia Type: MAC Level of consciousness: awake and alert Pain management: pain level controlled Vital Signs Assessment: post-procedure vital signs reviewed and stable Respiratory status: spontaneous breathing, nonlabored ventilation, respiratory function stable and patient connected to nasal cannula oxygen Cardiovascular status: stable and blood pressure returned to baseline Postop Assessment: no apparent nausea or vomiting Anesthetic complications: no    Last Vitals:  Vitals:   10/19/17 1240 10/19/17 1248  BP: 139/68 137/69  Pulse: 70 73  Resp: 18 16  Temp:    SpO2: 98% 98%    Last Pain:  Vitals:   10/19/17 1248  TempSrc:   PainSc: 0-No pain                 Angelys Yetman COKER

## 2017-10-19 NOTE — Op Note (Signed)
[facility] Patient Name: Peggy House Procedure Date : 10/19/2017 MRN: [id number] Attending MD: Otis Brace , MD Date of Birth: [birth year] CSN: [id number] Age: 57 Admit Type: Inpatient Procedure:                Upper GI endoscopy Indications:              Iron deficiency anemia secondary to chronic blood                            loss, Hematemesis, Melena Providers:                Otis Brace, MD, Burtis Junes, RN, Charolette Child,                            Technician, Dellie Catholic, CRNA Referring MD:              Medicines:                Sedation Administered by an Anesthesia Professional Complications:            No immediate complications. Estimated Blood Loss:     Estimated blood loss was minimal. Procedure:                Pre-Anesthesia Assessment:                           - Prior to the procedure, a History and Physical                            was performed, and patient medications and                            allergies were reviewed. The patient's tolerance of                            previous anesthesia was also reviewed. The risks                            and benefits of the procedure and the sedation                            options and risks were discussed with the patient.                            All questions were answered, and informed consent                            was obtained. Prior Anticoagulants: The patient has                            taken no previous anticoagulant or antiplatelet                            agents. ASA Grade Assessment: III - A patient with  severe systemic disease. After reviewing the risks                            and benefits, the patient was deemed in                            satisfactory condition to undergo the procedure.                           After obtaining informed consent, the endoscope was                            passed under direct vision. Throughout the                             procedure, the patient's blood pressure, pulse, and                            oxygen saturations were monitored continuously. The                            EG-[id number] ([id number]) scope was introduced through the                            mouth, and advanced to the second part of duodenum.                            The upper GI endoscopy was accomplished without                            difficulty. The patient tolerated the procedure                            well. Scope In: Scope Out: Findings:      The Z-line was regular and was found 38 cm from the incisors.      A small hiatal hernia was present.      The exam of the esophagus was otherwise normal.      There is no endoscopic evidence of bleeding in the entire examined       stomach.      A few no bleeding angioectasias were found in the gastric fundus and in       the gastric body. there was one AVM in the body of the stomach with       contact oozing. Fulguration to ablate the lesion to prevent bleeding by       argon plasma at 1 liter/minute and 20 watts was successful.      The second portion of the duodenum was normal.      Polypoid area was noted at the previous ulcer site in the duodenal bulb.       No evidence of active bleeding in the examined duodenum. Impression:               - Z-line regular, 38 cm from the incisors.                           -  Small hiatal hernia.                           - A few non-bleeding angioectasias in the stomach.                            Treated with argon plasma coagulation (APC).                           - Normal second portion of the duodenum.                           - No specimens collected. Recommendation:           - Return patient to hospital ward for ongoing care.                           - Full liquid diet.                           - Continue present medications. Procedure Code(s):        --- Professional ---                           [phone],  Esophagogastroduodenoscopy, flexible,                            transoral; with control of bleeding, any method Diagnosis Code(s):        --- Professional ---                           K44.9, Diaphragmatic hernia without obstruction or                            gangrene                           K31.819, Angiodysplasia of stomach and duodenum                            without bleeding                           D50.0, Iron deficiency anemia secondary to blood                            loss (chronic)                           K92.0, Hematemesis                           K92.1, Melena (includes Hematochezia) CPT copyright 2017 [employer]. All rights reserved. The codes documented in this report are preliminary and upon coder review may  be revised to meet current compliance requirements. Otis Brace, MD Otis Brace, MD 10/19/2017 12:34:51 PM Number of Addenda: 0

## 2017-10-19 NOTE — Brief Op Note (Signed)
10/15/2017 - 10/19/2017  12:36 PM  PATIENT:  Peggy House  56 y.o. female  PRE-OPERATIVE DIAGNOSIS:  hematemesis  POST-OPERATIVE DIAGNOSIS:  gastric AVMs  PROCEDURE:  Procedure(s): ESOPHAGOGASTRODUODENOSCOPY (EGD) WITH PROPOFOL (N/A) HOT HEMOSTASIS (ARGON PLASMA COAGULATION/BICAP) (N/A)  SURGEON:  Surgeon(s) and Role:    * Nazario Russom, MD - Primary  Findings ----------- - EGD showed no active bleeding. A few AVMs in the stomach were treated with APC. There was one AVM with contact bleeding in the body of stomach while treating with APC. That was further treated with application of more APC treatment.  Recommendations -------------------------- - Full liquid diet - Continue supportive care.monitor H&H Transfuse as needed to keep hemoglobin around 8. - initiated discussion with tertiary care center for possible transfer. - GI will follow.   Otis Brace MD, [place] 10/19/2017, 12:37 PM  Contact #  [phone]

## 2017-10-19 NOTE — Anesthesia Preprocedure Evaluation (Signed)
Anesthesia Evaluation  Patient identified by MRN, date of birth, ID band Patient awake    Reviewed: Allergy & Precautions, NPO status , Patient's Chart, lab work & pertinent test results  Airway Mallampati: III  TM Distance: >3 FB Neck ROM: Full    Dental  (+) Poor Dentition, Loose,    Pulmonary Current Smoker,    breath sounds clear to auscultation       Cardiovascular hypertension,  Rhythm:Regular Rate:Normal     Neuro/Psych    GI/Hepatic   Endo/Other    Renal/GU      Musculoskeletal   Abdominal (+) + obese,   Peds  Hematology   Anesthesia Other Findings   Reproductive/Obstetrics                             Anesthesia Physical Anesthesia Plan  ASA: III and emergent  Anesthesia Plan: MAC   Post-op Pain Management:    Induction: Intravenous  PONV Risk Score and Plan: Ondansetron, Propofol infusion and Midazolam  Airway Management Planned: Natural Airway and Nasal Cannula  Additional Equipment:   Intra-op Plan:   Post-operative Plan:   Informed Consent: I have reviewed the patients History and Physical, chart, labs and discussed the procedure including the risks, benefits and alternatives for the proposed anesthesia with the patient or authorized representative who has indicated his/her understanding and acceptance.   Dental advisory given  Plan Discussed with: CRNA and Anesthesiologist  Anesthesia Plan Comments:         Anesthesia Quick Evaluation

## 2017-10-19 NOTE — Progress Notes (Signed)
[facility] Gastroenterology Progress Note  Peggy House 57 y.o. 1960/05/04  CC:  Recurrent GI bleed   Subjective: [name] She had an  episode of hematemesis and black tarry stool few hours ago. She denied abdominal pain. Discussed with nursing staff. No bright red blood per rectum. Hemoglobin dropped to 5.9 again. Currently getting blood transfusion.  ROS : negative for chest pain and shortness of breath.   Objective: Vital signs in last 24 hours: Vitals:   10/19/17 0145 10/19/17 0203  BP: 126/64 (!) 125/57  Pulse: 66 68  Resp: 18 18  Temp: 98.5 F (36.9 C) 98 F (36.7 C)  SpO2: 100% 99%    Physical Exam:  General:  Alert, cooperative, no distress, appears stated age  Head:  Normocephalic, without obvious abnormality, atraumatic  Eyes:  , EOM's intact,   Lungs:   Clear to auscultation bilaterally, respirations unlabored  Heart:  Regular rate and rhythm, murmur present  Abdomen:   Soft, non-tender, bowel sounds present, nondistended, no peritoneal signs          Lab Results: Recent Labs    10/16/17 0805 10/17/17 0444  NA 143 142  K 3.8 3.7  CL 115* 113*  CO2 24 23  GLUCOSE 103* 128*  BUN 7 5*  CREATININE 0.80 0.72  CALCIUM 7.9* 8.3*   No results for input(s): AST, ALT, ALKPHOS, BILITOT, PROT, ALBUMIN in the last 72 hours. Recent Labs    10/18/17 0603 10/18/17 1345 10/18/17 2046  WBC 15.1*  --  17.2*  HGB 6.4* 8.5* 5.9*  HCT 21.7* 28.2* 19.2*  MCV 90.4  --  91.4  PLT 272  --  220   Recent Labs    10/16/17 0925  LABPROT 14.7  INR 1.16      Assessment/Plan: - recurrent GI bleed. Multiple EGDs in the past. 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 - hematemesis - Acute blood loss anemia.  - recurrent epistaxis   Recommendations -------------------------- - she is now having hematemesis along with  black tarry stool . Drop in hemoglobin to 5.9. - Repeat EGD in the morning. -  Bleeding scan - transfuse to keep hemoglobin around 8. - GI will follow   - Capsule endoscopy 06/26  showed rapid transit of capsule through the small bowel within 2 hours. Patient was having active nosebleed and there was some blood seen trickling down from nasopharynx into the esophagus and subsequently into the small bowel. During capsule endoscopy, there was evidence of fresh blood at 1 hour and 24 minutes (most likely distal jejunum/proximal ileum)  Which at that time  I believe was trickled down blood from her active epistaxis at the time of procedure. ( She could have bleeding AVM at this location- most likely distal jejunum/ proximal ileum ) I did not see any evidence of bleeding proximal to or distal to that area. She also had few tiny  AVMs in the proximal small bowel.    Otis Brace MD, [place] 10/19/2017, 3:25 AM  Contact #  [phone]

## 2017-10-19 NOTE — Transfer of Care (Signed)
Immediate Anesthesia Transfer of Care Note  Patient: Peggy House  Procedure(s) Performed: ESOPHAGOGASTRODUODENOSCOPY (EGD) WITH PROPOFOL (N/A ) HOT HEMOSTASIS (ARGON PLASMA COAGULATION/BICAP) (N/A )  Patient Location: Endoscopy Unit  Anesthesia Type:MAC  Level of Consciousness: awake, alert  and oriented  Airway & Oxygen Therapy: Patient Spontanous Breathing and Patient connected to nasal cannula oxygen  Post-op Assessment: Report given to RN and Post -op Vital signs reviewed and stable  Post vital signs: Reviewed and stable  Last Vitals:  Vitals Value Taken Time  BP    Temp    Pulse 84 10/19/2017 12:33 PM  Resp 18 10/19/2017 12:33 PM  SpO2 98 % 10/19/2017 12:33 PM  Vitals shown include unvalidated device data.  Last Pain:  Vitals:   10/19/17 1159  TempSrc: Oral  PainSc: 0-No pain         Complications: No apparent anesthesia complications

## 2017-10-19 NOTE — Progress Notes (Signed)
Pt had an episode of simultaneous blood clot emesis and dark tarry stool. Residents on call were notified. They came to the bedside . Vitals completed. Bolu and 2 units od blood ordered and given. Will continue to monitor.

## 2017-10-20 DIAGNOSIS — I2721 Secondary pulmonary arterial hypertension: Secondary | ICD-10-CM

## 2017-10-20 LAB — BASIC METABOLIC PANEL
Anion gap: 8 (ref 5–15)
BUN: 14 mg/dL (ref 6–20)
CALCIUM: 8.2 mg/dL — AB (ref 8.9–10.3)
CO2: 24 mmol/L (ref 22–32)
CREATININE: 0.85 mg/dL (ref 0.44–1.00)
Chloride: 113 mmol/L — ABNORMAL HIGH (ref 98–111)
GFR calc Af Amer: >60 mL/min (ref >60)
GFR calc non Af Amer: >60 mL/min (ref >60)
Glucose, Bld: 92 mg/dL (ref 70–99)
Potassium: 3.5 mmol/L (ref 3.5–5.1)
Sodium: 145 mmol/L (ref 135–145)

## 2017-10-20 LAB — CBC
HEMATOCRIT: 30.3 % — AB (ref 36.0–46.0)
Hemoglobin: 9.3 g/dL — ABNORMAL LOW (ref 12.0–15.0)
MCH: 28.4 pg (ref 26.0–34.0)
MCHC: 30.7 g/dL (ref 30.0–36.0)
MCV: 92.7 fL (ref 78.0–100.0)
Platelets: 238 10*3/uL (ref 150–400)
RBC: 3.27 MIL/uL — ABNORMAL LOW (ref 3.87–5.11)
RDW: 18.3 % — ABNORMAL HIGH (ref 11.5–15.5)
WBC: 14.3 10*3/uL — AB (ref 4.0–10.5)

## 2017-10-20 LAB — PREPARE RBC (CROSSMATCH)

## 2017-10-20 LAB — HEMOGLOBIN AND HEMATOCRIT, BLOOD
HCT: 23.9 % — ABNORMAL LOW (ref 36.0–46.0)
HEMATOCRIT: 26.2 % — AB (ref 36.0–46.0)
HEMOGLOBIN: 7.4 g/dL — AB (ref 12.0–15.0)
Hemoglobin: 8 g/dL — ABNORMAL LOW (ref 12.0–15.0)

## 2017-10-20 MED ORDER — SODIUM CHLORIDE 0.9% IV SOLUTION
Freq: Once | INTRAVENOUS | Status: AC
  Administered 2017-10-20: 19:00:00 via INTRAVENOUS

## 2017-10-20 NOTE — Progress Notes (Signed)
   Subjective: Patient's hgb dropped from 7.8 to 7.4 overnight. She has not had any episodes of hematemesis, melena or BRBPR since being seen yesterday. Overall, she feels well. Denies headaches, chest pain, palpitations, nausea, vomiting or abdominal pain. We discussed with her the possibility of being transferred to a tertiary care center for further treatment and will wait for an update from GI.   Objective:  Vital signs in last 24 hours: Vitals:   10/20/17 0606 10/20/17 0910 10/20/17 1213 10/20/17 1232  BP: (!) 145/79 (!) 144/76 123/76 124/74  Pulse: 64 67 65 67  Resp:  19 16 18   Temp: 99.1 F (37.3 C) 98.8 F (37.1 C) 97.8 F (36.6 C) 98.7 F (37.1 C)  TempSrc:  Oral Oral Oral  SpO2: 99% 100% 100% 100%  Weight:      Height:       Physical Exam General: awake, alert, pleasant female sitting up in bed in NAD Cardiovascular: regular rate, normal rhythm. SEM heard best at upper sternal bordres. Respiratory: No increased work of breathing; lungs CTA in bilateral anterior fields.  Abdominal: BS +; abdomen is soft, non-distended, non-tender.   Assessment/Plan:  Principal Problem:   GI bleed Active Problems:   Iron deficiency anemia   Hereditary hemorrhagic telangiectasia (HCC)   Pulmonary artery hypertension (HCC)  Acute blood loss anemia 2/2 GI bleed in setting of likely HHT Iron deficiency anemia: S/p EGD 7/5 which revealed stomachx2, duodenalx1 and multiple duodenal angiectasias s/p APC; EGD 7/7 showed multiple AVMs s/p APC. GI has initiated contact for possible transfer to tertiary care center. They feel a double balloon enterescopy would be beneficial, as they do not feel the cause of her continued drop in hgb is from any of the AVMs they have been able to visualize and tagged RBC scan from 7/7 does not show evidence of ongoing GI bleed. They suspect she may have bleeding in the distal small bowel. GI has initiated contact with hospital team at [facility] and are awaiting  confirmation from GI team.    DIC panel was negative and work up for VWD was negative. No mention of pulmonary AVMs on bubble Echo.  She is s/p 6u pRBCs and 2x doses of IV ferric gluconate so far this admission.  Hgb dropped from 7.8 to 7.4. She will receive another unit of pRBCs and follow-up on H&H (total of 7 units this admission).  --Continue tamoxifen and amicar per heme --H/H q8rh; transfuse to keep ~8 --will get further ferric gluconate as outpt with Heme --possible transfer to tertiary care center for double balloon enterescopy  Elevated pulmonary artery pressure: Echocardiogram with bubble study did not mention findings consistent with pulmonary AVMs; it did reveal elevated pulmonary artery pressures and G2DD. LVEF was wnl. Further oupt w/u (PFTs, sleep study, possible V/Q scan (with HHT in setting of recurrent recent bleeding she is at risk for VTE as factors are depleted), RHC) is recommended to identify source of elevated pulmonary artery pressure.   HTN: Continue holding amlodipine for now.  HLD: Continue atorvastatin 40mg  daily  MDD: Continue sertraline 50mg  daily.   Dispo: Anticipated discharge in approximately 2-3 day(s); possible transfer to tertiary care center for double balloon enteroscopy.    Modena Nunnery D, DO 10/20/2017, 1:18 PM Pager: [phone]

## 2017-10-20 NOTE — Progress Notes (Signed)
[facility] Gastroenterology Progress Note  Peggy House 57 y.o. Feb 10, 1961  CC:  Recurrent GI bleed   Subjective: . No further bleeding episodes but she continues to have drop in hemoglobin. She denies abdominal pain, nausea vomiting.  ROS : negative for chest pain and shortness of breath.   Objective: Vital signs in last 24 hours: Vitals:   10/20/17 1213 10/20/17 1232  BP: 123/76 124/74  Pulse: 65 67  Resp: 16 18  Temp: 97.8 F (36.6 C) 98.7 F (37.1 C)  SpO2: 100% 100%    Physical Exam:  General:  Alert, cooperative, no distress, appears stated age  Head:  Normocephalic, without obvious abnormality, atraumatic  Eyes:  , EOM's intact,   Lungs:   Clear to auscultation bilaterally, respirations unlabored  Heart:  Regular rate and rhythm, murmur present  Abdomen:   Soft, non-tender, bowel sounds present, nondistended, no peritoneal signs          Lab Results: Recent Labs    10/20/17 0229  NA 145  K 3.5  CL 113*  CO2 24  GLUCOSE 92  BUN 14  CREATININE 0.85  CALCIUM 8.2*   No results for input(s): AST, ALT, ALKPHOS, BILITOT, PROT, ALBUMIN in the last 72 hours. Recent Labs    10/18/17 2046 10/19/17 0550  10/20/17 0229 10/20/17 0923  WBC 17.2* 15.1*  --   --   --   HGB 5.9* 7.8*   < > 7.4* 8.0*  HCT 19.2* 25.5*   < > 23.9* 26.2*  MCV 91.4 91.1  --   --   --   PLT 220 228  --   --   --    < > = values in this interval not displayed.   No results for input(s): LABPROT, INR in the last 72 hours.    Assessment/Plan: - recurrent GI bleed. Multiple EGDs in the past. 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 - history of duodenal ulcer. EGD on 09/22/2017 showed bleeding from duodenal bulb ulcer which was treated with clips placement and epinephrine injection. - hematemesis. Resolved.  - Acute blood loss anemia.  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 Currently on Amicar and tamoxifen.    Recommendations -------------------------- - Bleeding scan negative. Multiple EGDs in the recent past showed AVMs which were treated with APC's. Most of the AVMs were Non bleeding and does not explain patient's ongoing Drop in Hemoglobin. I Think She May Be Having Bleeding into the Distal Small Bowel and she would benefit from double balloon enteroscopy.  - discussed with hospital team at [facility]. They are awaiting confirmation from GI team .   - We will continue supportive care. Transfuse as needed to keep hemoglobin around 8.  - Capsule endoscopy 06/26  showed rapid transit of capsule through the small bowel within 2 hours. Patient was having active nosebleed and there was some blood seen trickling down from nasopharynx into the esophagus and subsequently into the small bowel. During capsule endoscopy, there was evidence of fresh blood at 1 hour and 24 minutes (most likely distal jejunum/proximal ileum)  Which at that time  I believe was trickled down blood from her active epistaxis at the time of procedure. ( She could have bleeding AVM at this location- most likely distal jejunum/ proximal ileum ) I did not see any evidence of bleeding proximal to  or distal to that area. She also had few tiny  AVMs in the proximal small bowel.    Otis Brace MD, [place] 10/20/2017, 1:01 PM  Contact #  [phone]

## 2017-10-20 NOTE — Progress Notes (Signed)
Peggy House   DOB:02-May-1960   RD#:[id number]   HUD#:[id number]  Hematology follow up  Subjective: Pt had recurrent bloody stool and bloody emesis on Saturday 7/6, which required multiple blood transfusion and repeated EGD yesterday which showed a few AVMs which was treated with APCs. Dr. Alessandra House has recommended transferring pt to [facility] for double-balloon endoscopy.  Patient feels well overall, denies any pain or nausea.  Had a very small bowel movement this morning.  Received 1 more unit of blood today.  Objective:  Vitals:   10/20/17 1232 10/20/17 1510  BP: 124/74 131/71  Pulse: 67 66  Resp: 18 13  Temp: 98.7 F (37.1 C) 98.2 F (36.8 C)  SpO2: 100% 100%    Body mass index is 42.12 kg/m.  Intake/Output Summary (Last 24 hours) at 10/20/2017 1955 Last data filed at 10/20/2017 1700 Gross per 24 hour  Intake 1675 ml  Output 3000 ml  Net -1325 ml     Sclerae unicteric  Oropharynx clear, she has a few telangiectasia in her mucosa and tongue, but nothing in her fingertips and skin   No peripheral adenopathy  Lungs clear -- no rales or rhonchi  Heart regular rate and rhythm  Abdomen benign  MSK no focal spinal tenderness, no peripheral edema  Neuro nonfocal   CBG (last 3)  No results for input(s): GLUCAP in the last 72 hours.   Labs:  Lab Results  Component Value Date   WBC 14.3 (H) 10/20/2017   HGB 9.3 (L) 10/20/2017   HCT 30.3 (L) 10/20/2017   MCV 92.7 10/20/2017   PLT 238 10/20/2017   NEUTROABS 9.5 (H) 10/15/2017   CMP Latest Ref Rng & Units 10/20/2017 10/17/2017 10/16/2017  Glucose 70 - 99 mg/dL 92 128(H) 103(H)  BUN 6 - 20 mg/dL 14 5(L) 7  Creatinine 0.44 - 1.00 mg/dL 0.85 0.72 0.80  Sodium 135 - 145 mmol/L 145 142 143  Potassium 3.5 - 5.1 mmol/L 3.5 3.7 3.8  Chloride 98 - 111 mmol/L 113(H) 113(H) 115(H)  CO2 22 - 32 mmol/L 24 23 24   Calcium 8.9 - 10.3 mg/dL 8.2(L) 8.3(L) 7.9(L)  Total Protein 6.5 - 8.1 g/dL - - -  Total Bilirubin 0.3 - 1.2 mg/dL - - -  Alkaline  Phos 38 - 126 U/L - - -  AST 15 - 41 U/L - - -  ALT 14 - 54 U/L - - -     Urine Studies No results for input(s): UHGB, CRYS in the last 72 hours.  Invalid input(s): UACOL, UAPR, USPG, UPH, UTP, UGL, UKET, UBIL, UNIT, UROB, ULEU, UEPI, UWBC, URBC, UBAC, CAST, [place], [place]  Basic Metabolic Panel: Recent Labs  Lab 10/16/17 0805 10/17/17 0444 10/20/17 0229  NA 143 142 145  K 3.8 3.7 3.5  CL 115* 113* 113*  CO2 24 23 24   GLUCOSE 103* 128* 92  BUN 7 5* 14  CREATININE 0.80 0.72 0.85  CALCIUM 7.9* 8.3* 8.2*   GFR Estimated Creatinine Clearance: 95.2 mL/min (by C-G formula based on SCr of 0.85 mg/dL). Liver Function Tests: No results for input(s): AST, ALT, ALKPHOS, BILITOT, PROT, ALBUMIN in the last 168 hours. No results for input(s): LIPASE, AMYLASE in the last 168 hours. No results for input(s): AMMONIA in the last 168 hours. Coagulation profile Recent Labs  Lab 10/16/17 0925  INR 1.16    CBC: Recent Labs  Lab 10/15/17 1116  10/17/17 0444 10/18/17 0603  10/18/17 2046 10/19/17 0550 10/19/17 1404 10/19/17 1926 10/20/17 0229 10/20/17  3267 10/20/17 1912  WBC 11.9*   < > 11.4* 15.1*  --  17.2* 15.1*  --   --   --   --  14.3*  NEUTROABS 9.5*  --   --   --   --   --   --   --   --   --   --   --   HGB 5.6*   < > 8.1* 6.4*   < > 5.9* 7.8* 8.0* 7.8* 7.4* 8.0* 9.3*  HCT 17.7*   < > 26.4* 21.7*   < > 19.2* 25.5* 25.5* 25.3* 23.9* 26.2* 30.3*  MCV 85.3   < > 86.6 90.4  --  91.4 91.1  --   --   --   --  92.7  PLT 332   < > 272 272  --  220 228  --   --   --   --  238   < > = values in this interval not displayed.   Cardiac Enzymes: No results for input(s): CKTOTAL, CKMB, CKMBINDEX, TROPONINI in the last 168 hours. BNP: Invalid input(s): POCBNP CBG: No results for input(s): GLUCAP in the last 168 hours. D-Dimer No results for input(s): DDIMER in the last 72 hours. Hgb A1c No results for input(s): HGBA1C in the last 72 hours. Lipid Profile No results for input(s):  CHOL, HDL, LDLCALC, TRIG, CHOLHDL, LDLDIRECT in the last 72 hours. Thyroid function studies No results for input(s): TSH, T4TOTAL, T3FREE, THYROIDAB in the last 72 hours.  Invalid input(s): FREET3 Anemia work up No results for input(s): VITAMINB12, FOLATE, FERRITIN, TIBC, IRON, RETICCTPCT in the last 72 hours. Microbiology No results found for this or any previous visit (from the past 240 hour(s)).    Studies:  Nm Gi Blood Loss  Result Date: 10/19/2017 CLINICAL DATA:  Black tarry stool for the past 4 days. Recent hospitalization for upper GI bleed. EXAM: NUCLEAR MEDICINE GASTROINTESTINAL BLEEDING SCAN TECHNIQUE: Sequential abdominal images were obtained following intravenous administration of Tc-8m labeled red blood cells. RADIOPHARMACEUTICALS:  25.7 mCi Tc-17m pertechnetate in-vitro labeled red cells. COMPARISON:  CT abdomen pelvis-05/06/2016 FINDINGS: There is no abnormal intraluminal radiotracer to suggest the presence of acute / ongoing GI bleeding. Physiologic activity is seen within the heart, pulmonary parenchyma, abdominal vasculature, urinary bladder, spleen and liver. IMPRESSION: No scintigraphic evidence of acute / ongoing GI bleeding. Electronically Signed   By: Sandi Mariscal M.D.   On: 10/19/2017 12:08    Assessment: 57 y.o. with chronic recurrent epistaxis since childhood, recurrent GI bleeding with AVMs  1. 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  Severe anemia from GI bleeding and iron deficiency 4. 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, required 5u blood so far since her admission on 7/3 -she is in the process being referred/transferred to [facility] for double-balloon endoscopy, for recurrent bleeding from AVMs, especially from small bowel -continue Tamoxifen  and Amicar at current dose for now, it has not seemed helped  -We discussed AVastin infusion as outpt to prevent recurrent bleeding -I spoke with her son Peggy House on the phone and updated her condition  -I encourage her to call me when she is released from [facility] and I will get her in to my clinic for close f/u   Truitt Merle, MD 10/20/2017  7:55 PM

## 2017-10-20 NOTE — Progress Notes (Signed)
Unit of PRBC completed. Pt denies symptoms and shows no signs of transfusion reaction. IV flushed.  Pt happy to advance to soft diet. Awaiting word from doctors on possible transfer to [facility]

## 2017-10-20 NOTE — Progress Notes (Signed)
Transfusing one unit PRBC. Pt educated on s/s of transfusion reaction, demonstrated understanding

## 2017-10-20 NOTE — Anesthesia Postprocedure Evaluation (Signed)
Anesthesia Post Note  Patient: Peggy House  Procedure(s) Performed: ESOPHAGOGASTRODUODENOSCOPY (EGD) WITH PROPOFOL (N/A ) HOT HEMOSTASIS (ARGON PLASMA COAGULATION/BICAP) (N/A ) ENTEROSCOPY (N/A )     Patient location during evaluation: Endoscopy Anesthesia Type: MAC Level of consciousness: awake and alert Pain management: pain level controlled Vital Signs Assessment: post-procedure vital signs reviewed and stable Respiratory status: spontaneous breathing, nonlabored ventilation, respiratory function stable and patient connected to nasal cannula oxygen Cardiovascular status: stable and blood pressure returned to baseline Postop Assessment: no apparent nausea or vomiting Anesthetic complications: no    Last Vitals:  Vitals:   10/19/17 2055 10/20/17 0606  BP: 132/71 (!) 145/79  Pulse: 80 64  Resp:    Temp: 37.3 C 37.3 C  SpO2: 98% 99%    Last Pain:  Vitals:   10/19/17 2046  TempSrc:   PainSc: 0-No pain                 Montez Hageman

## 2017-10-21 ENCOUNTER — Ambulatory Visit: Payer: Medicaid Other

## 2017-10-21 LAB — HEMOGLOBIN AND HEMATOCRIT, BLOOD
HCT: 26.8 % — ABNORMAL LOW (ref 36.0–46.0)
HCT: 29.2 % — ABNORMAL LOW (ref 36.0–46.0)
HCT: 29.4 % — ABNORMAL LOW (ref 36.0–46.0)
HEMOGLOBIN: 8.3 g/dL — AB (ref 12.0–15.0)
HEMOGLOBIN: 8.9 g/dL — AB (ref 12.0–15.0)
Hemoglobin: 9 g/dL — ABNORMAL LOW (ref 12.0–15.0)

## 2017-10-21 LAB — BASIC METABOLIC PANEL
Anion gap: 8 (ref 5–15)
BUN: 9 mg/dL (ref 6–20)
CO2: 23 mmol/L (ref 22–32)
Calcium: 8.3 mg/dL — ABNORMAL LOW (ref 8.9–10.3)
Chloride: 109 mmol/L (ref 98–111)
Creatinine, Ser: 0.89 mg/dL (ref 0.44–1.00)
GFR calc Af Amer: >60 mL/min (ref >60)
GFR calc non Af Amer: >60 mL/min (ref >60)
Glucose, Bld: 102 mg/dL — ABNORMAL HIGH (ref 70–99)
Potassium: 3.5 mmol/L (ref 3.5–5.1)
Sodium: 140 mmol/L (ref 135–145)

## 2017-10-21 NOTE — Progress Notes (Signed)
[facility] Gastroenterology Progress Note  Peggy House 57 y.o. May 16, 1960  CC:  Recurrent GI bleed   Subjective: . No further bleeding episodes . [name] Hemoglobin stable since last 24 hours. She denies abdominal pain, nausea vomiting.  ROS : negative for chest pain and shortness of breath.   Objective: Vital signs in last 24 hours: Vitals:   10/20/17 2238 10/21/17 0450  BP: (!) 141/76 136/62  Pulse: 68 60  Resp:  18  Temp: 98.1 F (36.7 C) 98.4 F (36.9 C)  SpO2: 100% 100%    Physical Exam:  General:  Alert, cooperative, no distress, appears stated age  Head:  Normocephalic, without obvious abnormality, atraumatic  Eyes:  , EOM's intact,   Lungs:   Clear to auscultation bilaterally, respirations unlabored  Heart:  Regular rate and rhythm, murmur present  Abdomen:   Soft, non-tender, bowel sounds present, nondistended, no peritoneal signs          Lab Results: Recent Labs    10/20/17 0229 10/21/17 0431  NA 145 140  K 3.5 3.5  CL 113* 109  CO2 24 23  GLUCOSE 92 102*  BUN 14 9  CREATININE 0.85 0.89  CALCIUM 8.2* 8.3*   No results for input(s): AST, ALT, ALKPHOS, BILITOT, PROT, ALBUMIN in the last 72 hours. Recent Labs    10/19/17 0550  10/20/17 1912 10/21/17 0431 10/21/17 1120  WBC 15.1*  --  14.3*  --   --   HGB 7.8*   < > 9.3* 8.3* 9.0*  HCT 25.5*   < > 30.3* 26.8* 29.2*  MCV 91.1  --  92.7  --   --   PLT 228  --  238  --   --    < > = values in this interval not displayed.   No results for input(s): LABPROT, INR in the last 72 hours.    Assessment/Plan: - recurrent GI bleed. Multiple EGDs in the past. 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 - history of duodenal ulcer. EGD on 09/22/2017 showed bleeding from duodenal bulb ulcer which was treated with clips placement and epinephrine injection. - hematemesis.  Resolved.  - Acute blood loss anemia.  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 Currently on Amicar and tamoxifen.    Recommendations -------------------------- - awaiting transfer to [facility]. If not able to transfer to [facility] by tomorrow, and if her hemoglobin remained stable, we can consider discharge home tomorrow and outpatient workup at [facility] for double balloon enteroscopy.   - Bleeding scan negative. Multiple EGDs in the recent past showed AVMs which were treated with APC's. Most of the AVMs were Non bleeding and does not explain patient's ongoing Drop in Hemoglobin. I Think She May Be Having Bleeding into the Distal Small Bowel and she would benefit from double balloon enteroscopy.   - We will continue supportive care. Transfuse as needed to keep hemoglobin around 8.  - Capsule endoscopy 06/26  showed rapid transit of capsule through the small bowel within 2 hours. Patient was having active nosebleed and there was some blood seen trickling down from nasopharynx into the esophagus and subsequently into the small bowel. During capsule endoscopy, there was evidence of fresh blood at 1 hour and 24 minutes (most likely distal jejunum/proximal ileum)  Which at that time  I believe was trickled down blood from her active  epistaxis at the time of procedure. ( She could have bleeding AVM at this location- most likely distal jejunum/ proximal ileum ) I did not see any evidence of bleeding proximal to or distal to that area.     Otis Brace MD, [place] 10/21/2017, 12:11 PM  Contact #  [phone]

## 2017-10-21 NOTE — Progress Notes (Signed)
  Date: 10/21/2017  Patient name: Peggy House  Medical record number: [id number]  Date of birth: 1960-10-08   I have seen and evaluated this patient and I have discussed the plan of care with the house staff. Please see their note for complete details. I concur with their findings with the following additions/corrections: Peggy House was seen on AM rounds with team. GI is attempting to arrange transfer to [facility] for push enteroscopy. Onc cont tamoxifen and amicar. Considering AVastin. We are following HgB and transfusing PRN to keep HgB about 8 in light of ongoing bleeding (cont to have BRBPR). Pt is at high risk for VTE due to [place] and inability to use pharmacologic DVT prophylaxis. SCD's off - pts states removed them when when to toilet.   Bartholomew Crews, MD 10/21/2017, 1:49 PM

## 2017-10-21 NOTE — Progress Notes (Signed)
   Subjective: Patient was seen and evaluated at bedside. No acute events overnight. She told us that she had a BM with a moderate amount of bright red blood present just before we came into see her. She denied any abdominal pain or lightheadedness with this bowel movement. We updated her that we are still waiting to hear from [place] about her possible transfer. No fevers, chills, chest pain, palpitations, n/v.   Objective:  Vital signs in last 24 hours: Vitals:   10/20/17 1232 10/20/17 1510 10/20/17 2238 10/21/17 0450  BP: 124/74 131/71 (!) 141/76 136/62  Pulse: 67 66 68 60  Resp: 18 13  18   Temp: 98.7 F (37.1 C) 98.2 F (36.8 C) 98.1 F (36.7 C) 98.4 F (36.9 C)  TempSrc: Oral Oral Oral Oral  SpO2: 100% 100% 100% 100%  Weight:      Height:       Physical exam General: awake, alert, pleasant female, sitting up in bed in NAD. Cardiovascular: regular rate, normal rhythm. SEM heard best at upper sternal borders.  Respiratory: no increased work of breathing. Lungs clear to auscultation in bilateral fields  Abdominal: BS+; abdomen is soft, non-distended, non-tender.   Assessment/Plan:  Principal Problem:   GI bleed Active Problems:   Iron deficiency anemia   Hereditary hemorrhagic telangiectasia (HCC)   Pulmonary artery hypertension (HCC)  Acute on chronic anemia 2/2 GI bleed in setting of likely HHT Iron deficiency anemia: S/p EGD 7/5 which revealed stomachx2, duodenalx1 and multiple duodenal angiectasias s/pAPC; EGD 7/7 showed multiple AVMs s/p APC.GI has initiated contact for possible transfer to tertiary care center.They feel a double balloon enterescopy would be beneficial, as they do not feel the cause of her continued drop in hgb is from any of the AVMs they have been able to visualize and tagged RBC scan from 7/7 does not show evidence of ongoing GI bleed. They suspect she may have bleeding in the distal small bowel. GI has initiated contact with hospital team at [facility] and  are awaiting confirmation from GI team. Per updated report, if she is not able to be transferred within the next day or so and hemoglobin remains stable, she can likely be discharged with plan to follow-up with [place] outpatient.     DIC panel was negative and work up for VWD was negative. No mention of pulmonary AVMs on bubble Echo. She is s/p7u pRBCs and 2x doses of IV ferric gluconate so far this admission. Hgb stable at 9.0 today.  --Continue tamoxifen and amicar per heme --H/H q8rh; transfuse to keep ~8 --will get further ferric gluconate as outpt with Heme; also considering possible Avastin therapy to prevent future bleeding.    Elevated pulmonary artery pressure: Echocardiogram with bubble study did not mention findings consistent with pulmonary AVMs; it did reveal elevated pulmonary artery pressures and G2DD. LVEF was wnl. Further oupt w/u (PFTs, sleep study, possible V/Q scan (with HHT in setting of recurrent recent bleeding she is at risk for VTE as factors are depleted), RHC) is recommended to identify source of elevated pulmonary artery pressure.   HTN: Continue holding amlodipine for now.  HLD: Continue atorvastatin 40mg  daily  MDD: Continue sertraline 50mg  daily.     Dispo: Anticipated discharge in approximately 1-2 day(s).   Modena Nunnery D, DO 10/21/2017, 2:07 PM Pager: [phone]

## 2017-10-22 LAB — BPAM RBC
BLOOD PRODUCT EXPIRATION DATE: 201907192359
Blood Product Expiration Date: 201907192359
Blood Product Expiration Date: 201907292359
ISSUE DATE / TIME: 201907070139
ISSUE DATE / TIME: 201907081151
UNIT TYPE AND RH: 600
UNIT TYPE AND RH: 600
Unit Type and Rh: 600

## 2017-10-22 LAB — TYPE AND SCREEN
ABO/RH(D): A NEG
Antibody Screen: NEGATIVE
Unit division: 0
Unit division: 0
Unit division: 0

## 2017-10-22 LAB — HEMOGLOBIN AND HEMATOCRIT, BLOOD
HCT: 29.8 % — ABNORMAL LOW (ref 36.0–46.0)
HEMOGLOBIN: 8.9 g/dL — AB (ref 12.0–15.0)

## 2017-10-22 MED ORDER — AMINOCAPROIC ACID 500 MG PO TABS
1000.0000 mg | ORAL_TABLET | ORAL | 1 refills | Status: DC
Start: 2017-10-22 — End: 2017-10-27

## 2017-10-22 NOTE — Progress Notes (Signed)
  Date: 10/22/2017  Patient name: Peggy House  Medical record number: [id number]  Date of birth: October 05, 1960   I have seen and evaluated this patient and I have discussed the plan of care with the house staff. Please see their note for complete details. I concur with their findings with the following additions/corrections: Peggy House was seen on AM rounds with team. She is tolerating reg diet and feels well. hgB steady at 8.9 (goal around 8) so will decrease freq of CBC's. Waiting to hear from GI reg [facility] transfer.  Bartholomew Crews, MD 10/22/2017, 11:53 AM

## 2017-10-22 NOTE — Progress Notes (Signed)
[name] to be D/C'd Home per MD order.  Discussed with the patient and all questions fully answered.  VSS, Skin clean, dry and intact without evidence of skin break down, no evidence of skin tears noted. IV catheter discontinued intact. Site without signs and symptoms of complications. Dressing and pressure applied.  An After Visit Summary was printed and given to the patient. Patient received prescription.  D/c education completed with patient/family including follow up instructions, medication list, d/c activities limitations if indicated, with other d/c instructions as indicated by MD - patient able to verbalize understanding, all questions fully answered.   Patient instructed to return to ED, call 911, or call MD for any changes in condition.   Patient escorted via [place], and D/C home via private auto.  Holley Raring 10/22/2017 4:09 PM

## 2017-10-22 NOTE — Progress Notes (Signed)
Peggy House Gastroenterology Progress Note  Peggy House 57 y.o. [birth year]  CC:  Recurrent GI bleed   Subjective: . No further bleeding episodes . Peggy House Hemoglobin stable since last 48 hours. She denies abdominal pain, nausea vomiting.  ROS : negative for chest pain and shortness of breath.   Objective: Vital signs in last 24 hours: Vitals:   10/22/17 0452 10/22/17 1357  BP: 136/67 137/70  Pulse: (!) 57 60  Resp: 16 17  Temp: 98.2 F (36.8 C) 98.6 F (37 C)  SpO2: 99% 100%    Physical Exam:  General:  Alert, cooperative, no distress, appears stated age  Head:  Normocephalic, without obvious abnormality, atraumatic  Eyes:  , EOM's intact,   Lungs:   Clear to auscultation bilaterally, respirations unlabored  Heart:  Regular rate and rhythm, murmur present  Abdomen:   Soft, non-tender, bowel sounds present, nondistended, no peritoneal signs          Lab Results: Recent Labs    10/20/17 0229 10/21/17 0431  NA 145 140  K 3.5 3.5  CL 113* 109  CO2 24 23  GLUCOSE 92 102*  BUN 14 9  CREATININE 0.85 0.89  CALCIUM 8.2* 8.3*   No results for input(s): AST, ALT, ALKPHOS, BILITOT, PROT, ALBUMIN in the last 72 hours. Recent Labs    10/20/17 1912  10/21/17 1952 10/22/17 0425  WBC 14.3*  --   --   --   HGB 9.3*   < > 8.9* 8.9*  HCT 30.3*   < > 29.4* 29.8*  MCV 92.7  --   --   --   PLT 238  --   --   --    < > = values in this interval not displayed.   No results for input(s): LABPROT, INR in the last 72 hours.    Assessment/Plan: - recurrent GI bleed. Multiple EGDs in the past. 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 - history of duodenal ulcer. EGD on 09/22/2017 showed bleeding from duodenal bulb ulcer which was treated with clips placement and epinephrine injection. - hematemesis. Resolved.  - Acute blood loss anemia.  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 Currently on Amicar and tamoxifen.    Recommendations -------------------------- - [facility] hospital currently on Diversion. D/W transfer coordinator at [facility].  - Dr. Durene Fruits from [place] will see patient as an office visit in next few weeks and they will arrange for outpatient Double balloon enteroscopy.  - ok to discharge from GI stand point - GI will sign off. Call us back if needed. F/up with [facility] in 4 weeks after discharge.     Peggy Brace MD, [place] 10/22/2017, 2:14 PM  Contact #  [phone]

## 2017-10-22 NOTE — Progress Notes (Signed)
   Subjective: [name] was seen and evaluated at bedside. No acute events overnight. She feels well overall and is tolerating regular diet. She has not had a BM since yesterday morning. She has not passed any BRBPR. Denies any n/v, lightheadedness, chest pain, palpitations, shortness of breath, or abdominal pain.   Objective:  Vital signs in last 24 hours: Vitals:   10/20/17 2238 10/21/17 0450 10/21/17 1412 10/22/17 0452  BP: (!) 141/76 136/62 126/67 136/67  Pulse: 68 60 65 (!) 57  Resp:  18 17 16   Temp: 98.1 F (36.7 C) 98.4 F (36.9 C) 98.2 F (36.8 C) 98.2 F (36.8 C)  TempSrc: Oral Oral Oral   SpO2: 100% 100% 100% 99%  Weight:      Height:       Physical exam General: awake, alert, pleasant female, sitting up in bed eating her breakfast. Cardiovascular: regular rate, normal rhythm. SEM heard best at upper sternal borders. Respiratory: no increased work of breathing. Lungs clear to auscultation bilaterally. Abdominal: Bowel sounds +. Abdomen is soft, non-distended, non-tender.   Assessment/Plan:  Principal Problem:   GI bleed Active Problems:   Iron deficiency anemia   Hereditary hemorrhagic telangiectasia (HCC)   Pulmonary artery hypertension (HCC)  Acute on chronic anemia 2/2 GI bleed in setting of likely HHT Iron deficiency anemia: S/p EGD 7/5 which revealed stomachx2, duodenalx1 and multiple duodenal angiectasias s/pAPC; EGD 7/7 showed multiple AVMs s/p APC.GI feels a double balloon enterescopy would be beneficial, as they do not think the cause of her continued drop in hgb is from any of the AVMs they have been able to visualize and tagged RBC scan from 7/7 does not show evidence of ongoing GI bleed. They suspect she may have bleeding in the distal small bowel. GI attempted transfer to [facility] for this procedure, but we have received word that they are on diversion. GI has signed off with hgb remaining stable for 48 hours. Will plan close follow-up in the outpatient  clinic and [facility] referral as soon as possible.   DIC panel was negative and work up for VWD was negative. No mention of pulmonary AVMs on bubble Echo. She is s/p7u pRBCs and 2x doses of IV ferric gluconate so far this admission.Hgb stable at 8.9 today.  --Continue tamoxifen and amicar per heme --will get further ferric gluconate as outpt with Heme; also considering possible Avastin therapy to prevent future bleeding.    Elevated pulmonary artery pressure: Echocardiogram with bubble study did not mention findings consistent with pulmonary AVMs; it did reveal elevated pulmonary artery pressures and G2DD. LVEF was wnl. Further oupt w/u (PFTs, sleep study, possible V/Q scan (with HHT in setting of recurrent recent bleeding she is at risk for VTE as factors are depleted), RHC) is recommended to identify source of elevated pulmonary artery pressure.   HTN: Will continue to hold amlodipine upon discharge and follow-up on blood pressure outpatient.   HLD: Continue atorvastatin 40mg  daily  MDD: Continue sertraline 50mg  daily.    Dispo: Anticipated discharge home today.   Modena Nunnery D, DO 10/22/2017, 1:30 PM Pager: [phone]

## 2017-10-23 ENCOUNTER — Telehealth: Payer: Self-pay | Admitting: Hematology

## 2017-10-23 NOTE — Telephone Encounter (Signed)
Spoke with patient regarding new appt added to her schedule per 7/10 sch msg

## 2017-10-24 ENCOUNTER — Ambulatory Visit: Payer: Medicaid Other

## 2017-10-24 ENCOUNTER — Other Ambulatory Visit: Payer: Self-pay | Admitting: Internal Medicine

## 2017-10-24 DIAGNOSIS — D500 Iron deficiency anemia secondary to blood loss (chronic): Secondary | ICD-10-CM

## 2017-10-24 NOTE — Telephone Encounter (Signed)
Needs refill on tamoxifen Peggy House, pt contact# [phone] Patient isn't feeling good, she needs that medicine for her stomach

## 2017-10-25 ENCOUNTER — Inpatient Hospital Stay (HOSPITAL_COMMUNITY)
Admission: EM | Admit: 2017-10-25 | Discharge: 2017-10-27 | DRG: 811 | Disposition: A | Discharge status: Other Acute Inpatient Hospital | Payer: Medicaid Other | Attending: Internal Medicine | Admitting: Internal Medicine

## 2017-10-25 ENCOUNTER — Other Ambulatory Visit: Payer: Self-pay

## 2017-10-25 ENCOUNTER — Inpatient Hospital Stay (HOSPITAL_COMMUNITY): Payer: Medicaid Other

## 2017-10-25 ENCOUNTER — Encounter (HOSPITAL_COMMUNITY): Payer: Self-pay | Admitting: *Deleted

## 2017-10-25 DIAGNOSIS — Z79899 Other long term (current) drug therapy: Secondary | ICD-10-CM

## 2017-10-25 DIAGNOSIS — Z9889 Other specified postprocedural states: Secondary | ICD-10-CM

## 2017-10-25 DIAGNOSIS — Y92008 Other place in unspecified non-institutional (private) residence as the place of occurrence of the external cause: Secondary | ICD-10-CM | POA: Diagnosis not present

## 2017-10-25 DIAGNOSIS — K3189 Other diseases of stomach and duodenum: Secondary | ICD-10-CM | POA: Diagnosis not present

## 2017-10-25 DIAGNOSIS — R011 Cardiac murmur, unspecified: Secondary | ICD-10-CM

## 2017-10-25 DIAGNOSIS — Z79810 Long term (current) use of selective estrogen receptor modulators (SERMs): Secondary | ICD-10-CM

## 2017-10-25 DIAGNOSIS — I998 Other disorder of circulatory system: Secondary | ICD-10-CM | POA: Diagnosis present

## 2017-10-25 DIAGNOSIS — Z91018 Allergy to other foods: Secondary | ICD-10-CM

## 2017-10-25 DIAGNOSIS — M479 Spondylosis, unspecified: Secondary | ICD-10-CM | POA: Diagnosis present

## 2017-10-25 DIAGNOSIS — Z91030 Bee allergy status: Secondary | ICD-10-CM | POA: Diagnosis not present

## 2017-10-25 DIAGNOSIS — Z8719 Personal history of other diseases of the digestive system: Secondary | ICD-10-CM

## 2017-10-25 DIAGNOSIS — K922 Gastrointestinal hemorrhage, unspecified: Secondary | ICD-10-CM | POA: Diagnosis present

## 2017-10-25 DIAGNOSIS — F339 Major depressive disorder, recurrent, unspecified: Secondary | ICD-10-CM | POA: Diagnosis present

## 2017-10-25 DIAGNOSIS — F17210 Nicotine dependence, cigarettes, uncomplicated: Secondary | ICD-10-CM | POA: Diagnosis present

## 2017-10-25 DIAGNOSIS — Z886 Allergy status to analgesic agent status: Secondary | ICD-10-CM

## 2017-10-25 DIAGNOSIS — E785 Hyperlipidemia, unspecified: Secondary | ICD-10-CM | POA: Diagnosis present

## 2017-10-25 DIAGNOSIS — I959 Hypotension, unspecified: Secondary | ICD-10-CM | POA: Diagnosis present

## 2017-10-25 DIAGNOSIS — R110 Nausea: Secondary | ICD-10-CM | POA: Diagnosis not present

## 2017-10-25 DIAGNOSIS — R55 Syncope and collapse: Secondary | ICD-10-CM | POA: Diagnosis not present

## 2017-10-25 DIAGNOSIS — R42 Dizziness and giddiness: Secondary | ICD-10-CM | POA: Diagnosis not present

## 2017-10-25 DIAGNOSIS — Z91038 Other insect allergy status: Secondary | ICD-10-CM | POA: Diagnosis not present

## 2017-10-25 DIAGNOSIS — Z888 Allergy status to other drugs, medicaments and biological substances status: Secondary | ICD-10-CM

## 2017-10-25 DIAGNOSIS — Z8679 Personal history of other diseases of the circulatory system: Secondary | ICD-10-CM | POA: Diagnosis not present

## 2017-10-25 DIAGNOSIS — F419 Anxiety disorder, unspecified: Secondary | ICD-10-CM | POA: Diagnosis present

## 2017-10-25 DIAGNOSIS — W2201XA Walked into wall, initial encounter: Secondary | ICD-10-CM | POA: Diagnosis present

## 2017-10-25 DIAGNOSIS — K219 Gastro-esophageal reflux disease without esophagitis: Secondary | ICD-10-CM | POA: Diagnosis present

## 2017-10-25 DIAGNOSIS — R58 Hemorrhage, not elsewhere classified: Secondary | ICD-10-CM | POA: Diagnosis not present

## 2017-10-25 DIAGNOSIS — W19XXXA Unspecified fall, initial encounter: Secondary | ICD-10-CM | POA: Diagnosis not present

## 2017-10-25 DIAGNOSIS — D62 Acute posthemorrhagic anemia: Principal | ICD-10-CM

## 2017-10-25 DIAGNOSIS — D500 Iron deficiency anemia secondary to blood loss (chronic): Secondary | ICD-10-CM | POA: Diagnosis not present

## 2017-10-25 DIAGNOSIS — M1710 Unilateral primary osteoarthritis, unspecified knee: Secondary | ICD-10-CM | POA: Diagnosis present

## 2017-10-25 DIAGNOSIS — K31811 Angiodysplasia of stomach and duodenum with bleeding: Secondary | ICD-10-CM | POA: Diagnosis present

## 2017-10-25 DIAGNOSIS — I10 Essential (primary) hypertension: Secondary | ICD-10-CM | POA: Diagnosis not present

## 2017-10-25 DIAGNOSIS — K921 Melena: Secondary | ICD-10-CM | POA: Diagnosis not present

## 2017-10-25 DIAGNOSIS — R402413 Glasgow coma scale score 13-15, at hospital admission: Secondary | ICD-10-CM | POA: Diagnosis present

## 2017-10-25 DIAGNOSIS — Z832 Family history of diseases of the blood and blood-forming organs and certain disorders involving the immune mechanism: Secondary | ICD-10-CM | POA: Diagnosis not present

## 2017-10-25 DIAGNOSIS — K5521 Angiodysplasia of colon with hemorrhage: Secondary | ICD-10-CM | POA: Diagnosis present

## 2017-10-25 DIAGNOSIS — I780 Hereditary hemorrhagic telangiectasia: Secondary | ICD-10-CM

## 2017-10-25 DIAGNOSIS — I270 Primary pulmonary hypertension: Secondary | ICD-10-CM | POA: Diagnosis not present

## 2017-10-25 DIAGNOSIS — K625 Hemorrhage of anus and rectum: Secondary | ICD-10-CM | POA: Diagnosis not present

## 2017-10-25 DIAGNOSIS — D509 Iron deficiency anemia, unspecified: Secondary | ICD-10-CM | POA: Diagnosis not present

## 2017-10-25 DIAGNOSIS — K3182 Dieulafoy lesion (hemorrhagic) of stomach and duodenum: Secondary | ICD-10-CM | POA: Diagnosis not present

## 2017-10-25 LAB — COMPREHENSIVE METABOLIC PANEL
ALK PHOS: 48 U/L (ref 38–126)
ALT: 8 U/L (ref 0–44)
ANION GAP: 7 (ref 5–15)
AST: 16 U/L (ref 15–41)
Albumin: 1.9 g/dL — ABNORMAL LOW (ref 3.5–5.0)
BILIRUBIN TOTAL: 0.3 mg/dL (ref 0.3–1.2)
BUN: 14 mg/dL (ref 6–20)
CALCIUM: 7.5 mg/dL — AB (ref 8.9–10.3)
CO2: 20 mmol/L — ABNORMAL LOW (ref 22–32)
Chloride: 117 mmol/L — ABNORMAL HIGH (ref 98–111)
Creatinine, Ser: 0.71 mg/dL (ref 0.44–1.00)
GFR calc non Af Amer: >60 mL/min (ref >60)
Glucose, Bld: 116 mg/dL — ABNORMAL HIGH (ref 70–99)
POTASSIUM: 3.5 mmol/L (ref 3.5–5.1)
SODIUM: 144 mmol/L (ref 135–145)
TOTAL PROTEIN: 3.6 g/dL — AB (ref 6.5–8.1)

## 2017-10-25 LAB — CBC
HEMATOCRIT: 16.4 % — AB (ref 36.0–46.0)
Hemoglobin: 4.6 g/dL — CL (ref 12.0–15.0)
MCH: 28.2 pg (ref 26.0–34.0)
MCHC: 28 g/dL — ABNORMAL LOW (ref 30.0–36.0)
MCV: 100.6 fL — ABNORMAL HIGH (ref 78.0–100.0)
Platelets: 249 10*3/uL (ref 150–400)
RBC: 1.63 MIL/uL — AB (ref 3.87–5.11)
RDW: 18.6 % — ABNORMAL HIGH (ref 11.5–15.5)
WBC: 16.4 10*3/uL — AB (ref 4.0–10.5)

## 2017-10-25 LAB — HEMOGLOBIN AND HEMATOCRIT, BLOOD
HCT: 18.5 % — ABNORMAL LOW (ref 36.0–46.0)
HCT: 17.8 % — ABNORMAL LOW (ref 36.0–46.0)
Hemoglobin: 5.5 g/dL — CL (ref 12.0–15.0)
Hemoglobin: 5.5 g/dL — CL (ref 12.0–15.0)

## 2017-10-25 LAB — I-STAT BETA HCG BLOOD, ED (MC, WL, AP ONLY): I-stat hCG, quantitative: <5 m[IU]/mL (ref <5)

## 2017-10-25 LAB — PREPARE RBC (CROSSMATCH)

## 2017-10-25 LAB — POC OCCULT BLOOD, ED: Fecal Occult Bld: POSITIVE — AB

## 2017-10-25 MED ORDER — SODIUM CHLORIDE 0.9 % IV BOLUS
1000.0000 mL | Freq: Once | INTRAVENOUS | Status: AC
  Administered 2017-10-25: 1000 mL via INTRAVENOUS

## 2017-10-25 MED ORDER — FERROUS GLUCONATE 324 (38 FE) MG PO TABS
324.0000 mg | ORAL_TABLET | Freq: Three times a day (TID) | ORAL | Status: DC
  Administered 2017-10-25 – 2017-10-26 (×4): 324 mg via ORAL
  Filled 2017-10-25 (×3): qty 1

## 2017-10-25 MED ORDER — SODIUM CHLORIDE 0.9 % IV SOLN
10.0000 mL/h | Freq: Once | INTRAVENOUS | Status: AC
  Administered 2017-10-25: 10 mL/h via INTRAVENOUS

## 2017-10-25 MED ORDER — PROMETHAZINE HCL 25 MG/ML IJ SOLN
12.5000 mg | Freq: Four times a day (QID) | INTRAMUSCULAR | Status: DC | PRN
  Administered 2017-10-25 – 2017-10-26 (×3): 12.5 mg via INTRAVENOUS
  Filled 2017-10-25 (×3): qty 1

## 2017-10-25 MED ORDER — SERTRALINE HCL 50 MG PO TABS
50.0000 mg | ORAL_TABLET | Freq: Every day | ORAL | Status: DC
  Administered 2017-10-26: 50 mg via ORAL
  Filled 2017-10-25: qty 1

## 2017-10-25 MED ORDER — SODIUM CHLORIDE 0.9% IV SOLUTION
Freq: Once | INTRAVENOUS | Status: AC
  Administered 2017-10-25: 14:00:00 via INTRAVENOUS

## 2017-10-25 MED ORDER — TAMOXIFEN CITRATE 10 MG PO TABS
20.0000 mg | ORAL_TABLET | Freq: Every day | ORAL | Status: DC
  Administered 2017-10-25 – 2017-10-26 (×2): 20 mg via ORAL
  Filled 2017-10-25 (×2): qty 2

## 2017-10-25 MED ORDER — AMINOCAPROIC ACID 500 MG PO TABS
1000.0000 mg | ORAL_TABLET | ORAL | Status: DC
  Administered 2017-10-25 – 2017-10-26 (×7): 1000 mg via ORAL
  Filled 2017-10-25 (×8): qty 2

## 2017-10-25 MED ORDER — PANTOPRAZOLE SODIUM 40 MG PO TBEC
40.0000 mg | DELAYED_RELEASE_TABLET | Freq: Every day | ORAL | Status: DC
  Administered 2017-10-25 – 2017-10-26 (×2): 40 mg via ORAL
  Filled 2017-10-25 (×2): qty 1

## 2017-10-25 MED ORDER — SODIUM CHLORIDE 0.9% IV SOLUTION
Freq: Once | INTRAVENOUS | Status: AC
  Administered 2017-10-26: 01:00:00 via INTRAVENOUS

## 2017-10-25 MED ORDER — TECHNETIUM TC 99M-LABELED RED BLOOD CELLS IV KIT
26.1000 | PACK | Freq: Once | INTRAVENOUS | Status: AC | PRN
  Administered 2017-10-25: 26.1 via INTRAVENOUS

## 2017-10-25 NOTE — Progress Notes (Signed)
Peggy House [id number] Admission Data: 10/25/2017 5:13 PM Attending Provider: Bartholomew Crews, MD  YSH:UOHF, Peggy Starcher, DO Consults/ Treatment Team: Treatment Team:  Clarene Essex, MD  Peggy House is a 57 y.o. female patient admitted from ED awake, alert  & orientated  X 3,  Full Code, VSS - Blood pressure 108/61, pulse 81, temperature 99.8 F (37.7 C), temperature source Oral, resp. rate 18, height 5\' 5"  (1.651 m), weight 112.5 kg (248 lb 0.3 oz), SpO2 100 %., no c/o shortness of breath, no c/o chest pain, no distress noted. Tele # M06 placed and pt is currently running:normal sinus rhythm.   IV site WDL:  hand right, condition patent and no redness and wrist left, condition patent and no redness with a transparent dsg that's clean dry and intact.  Allergies:   Allergies  Allergen Reactions  . Feraheme [Ferumoxytol]     Muscle tetany, encephalopathy, fatigue, nausea  . Nsaids Other (See Comments)    Stomach bleeding episodes; Tylenol is OK  . Tomato Hives  . Wasp Venom Swelling     Past Medical History:  Diagnosis Date  . Anxiety   . Arthritis    knnes,back  . GERD (gastroesophageal reflux disease)   . History of swelling of feet   . Hyperlipidemia   . Hypertension   . Major depressive disorder, recurrent episode ([place]) 06/05/2015  . Obesity   . Snores     History:  obtained from chart review. Tobacco/alcohol: denied none  Pt orientation to unit, room and routine. Information packet given to patient/family and safety video watched.  Admission INP armband ID verified with patient/family, and in place. SR up x 2, fall risk assessment complete with Patient and family verbalizing understanding of risks associated with falls. Pt verbalizes an understanding of how to use the call bell and to call for help before getting out of bed.  Skin, clean-dry- intact without evidence of bruising, or skin tears.   No evidence of skin break down noted on exam. no rashes, no ecchymoses, no  petechiae, no nodules, no jaundice, no purpura, no wounds. Transferred to floor from ED. Had 2nd unit of blood running. Patient still having loose runny bloody stools    Will cont to monitor and assist as needed.  Salley Slaughter, RN 10/25/2017 5:13 PM

## 2017-10-25 NOTE — ED Triage Notes (Signed)
PT to go to Nuclear med for scan.

## 2017-10-25 NOTE — ED Triage Notes (Signed)
PT transported [place] room C26. Pt AO and speaking in full sentences. Pt reports she was DC on WED. 10/22/17 after being treated for GI bleed. Pt reports she felt dizzy after being in BR and had a Dark stool and bright red blood in stool.

## 2017-10-25 NOTE — ED Triage Notes (Signed)
PT is still off the floor to [facility] med.  SWAT RN ids with PT.

## 2017-10-25 NOTE — H&P (Signed)
Date: 10/25/2017  Patient name: Peggy House  Medical record number: [id number]  Date of birth: 11-25-1960   I have seen and evaluated Peggy House and discussed their care with the Residency Team. 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  She was hospitalized from July 3 through July 10 for acute on chronic anemia.  She was treated with Amicar, tamoxifen, and a total of 7 units of packed red blood cells.  On July 7, she had an EGD that showed a few nonbleeding angiectasia's in the stomach.  These were ablated.  Also on the seventh, she had a tagged red blood cell scan which did not show evidence of acute or ongoing bleeding.  The bleeding rate must be 0.1 to 0.5 mL/h for the RBC scan to detect.  Her prior EGD on the fifth showed 2 recently bleeding angiectasia is in the stomach, one in the duodenum, and multiple in the jejunum.  These are all also ablated.  The plan had been to transfer her to [facility] for double-balloon endoscopy but they did not have a bed open and she was discharged home with close follow-up.  She did not keep her [facility] appointment on the 12th.  She states that she was unable to get her Amicar due to [facility] preauthorization requirement.  Last night, she had bright red blood per rectum with bowel movements x3.  This was painless.  She also noted sweating, lightheadedness, and feeling like she was going to pass out.  While walking down the hall from the toilet, she ran into the wall.  Her husband came so that she did not fall to the ground.  She does feel that she did lose consciousness briefly.  EMS was called and transported her to the hospital.  She has had normal oral intake.  PMHx : HHT, chronic iron def / blood loss anemia Fam Hx : Aunt has HHT, mostly with nosebleeds Soc Hx : Married, lives with husband  Vitals:   10/25/17 1003 10/25/17 1015  BP: 102/62 114/70  Pulse:    Resp:  (!) 9 20  Temp:    SpO2:    Gen : NAD, speaking in full sentences HRRR loud 4/6 systolic murmur L sternal border ABD + BS, obese, soft Ext no edema Skin warm and dry  HgB 8.9 at D/C now 4.6 Heme + Alb 1.9  I personally viewed the EKG and confirmed my reading with the official read. Sinus, LAD, no ischemic changes  Assessment and Plan: I have seen and evaluated the patient as outlined above. 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  She was discharged on the 10th with a hemoglobin of 8.9.  She now presents with bright red blood per rectum and hemoglobin of 4.6.  GI has taken her to cardiology for a RBC scan.  She is being transfused with packed red blood cells.  Ideally, when she is stable, we will transfer her to [facility] for a push enteroscopy for more definitive management of her HHT.  1.  Acute on chronic iron deficiency and blood loss anemia - transfuse packed red blood cells for a hemoglobin goal of about 8.  Continue ferrous gluconate.  2.  HHT with ongoing blood loss -follow-up  RBC scan.  Work with GI to attempt transfer to [facility] for push enteroscopy for further management of more distal small bowel angiectasia's.  Continue Amicar and tamoxifen.  3.  VTE prophylaxis - with ongoing blood loss, pharmaceutical prophylaxis is contraindicated.  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  Bartholomew Crews, MD 7/13/201912:02 PM

## 2017-10-25 NOTE — ED Triage Notes (Signed)
Unable to do orthostatic BP because pt felt dizzy

## 2017-10-25 NOTE — H&P (Signed)
Date: 10/25/2017               Patient Name:  Peggy House MRN: [id number]  DOB: 1961/03/03 Age / Sex: 57 y.o., female   PCP: Peggy Gip, DO         Medical Service: Internal Medicine Teaching Service         Attending Physician: Dr. Bartholomew Crews, MD    First Contact: Dr. Koleen House  Pager: [phone]  Second Contact: Dr. Jari House Pager: [phone]       After Hours (After 5p/  First Contact Pager: [phone]  weekends / holidays): Second Contact Pager: [phone]   Chief Complaint: near-syncope  History of Present Illness: 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 She was recently hospitalized from 7/3-7/10 for acute on chronic anemia with episodes of BRBPR and hematemesis. She was treated with Amicar, tamoxifen and required 7 units of pRBCs. GI saw her and performed EGD on 7/5 which showed multiple bleeding angiectasias in the stomach, duodenum and jejunum, all of which were successfully ablated. She had another drop in hgb over the next 2 days, so a repeat EGD was done on 7/7 that showed multiple AVMs s/p APC. They also performed a tagged RBC scan that same day which did not show evidence of ongoing GI bleed. An attempt for transfer to [place] was made while she was here to undergo double balloon enteroscopy, but there were no available beds. She was discharged home on 7/10 with a hgb of 8.9 and plans for follow-up appointment in the outpatient clinic yesterday on 7/12 which she did not make.  When we saw her this morning, she explained that she missed the appointment because she forgot. She has also been unable to get her Amicar due to pre-authorization requirement from insurance. Last night she began having BRBPR with bowel movements x3. This morning while she was having a BM, she became diaphoretic and lightheaded. On her way down the hall from the  bathroom she ran into the wall. Her husband was able to catch her and lower her to the ground. She notes she may have lost consciousness for a few seconds. EMS was called and brought her to the ED. She denies any fevers, chills, chest pain, palpitations, shortness of breath, n/v, hemoptysis, hematemesis, or abdominal pain.    ED course: initial vitals: BP 99/53, P 84, T 99, Resp 18, O2 sat 100%. Started on 1L fluid bolus and 3 units pRBCs were ordered.          Meds:  Current Meds  Medication Sig  . albuterol (PROVENTIL HFA;VENTOLIN HFA) 108 (90 Base) MCG/ACT inhaler Inhale 1-2 puffs into the lungs every 6 (six) hours as needed for wheezing or shortness of breath.  [name] aminocaproic acid (AMICAR) 500 MG tablet Take 2 tablets (1,000 mg total) by mouth every 4 (four) hours while awake.  [name] atorvastatin (LIPITOR) 80 MG tablet take 1 tablet by mouth once daily  . ferrous gluconate (FERGON) 324 MG tablet Take 1 tablet (324 mg total) by mouth 3 (three) times daily with meals. (Patient taking differently: Take 324 mg by mouth daily with breakfast. )  . pantoprazole (PROTONIX) 40 MG tablet Take 1 tablet (40 mg total) by mouth 2 (two) times daily.  . sertraline (ZOLOFT) 50 MG tablet Take 1 tablet (50 mg total) by mouth daily.  . tamoxifen (  NOLVADEX) 20 MG tablet Take 1 tablet (20 mg total) by mouth daily.     Allergies: Allergies as of 10/25/2017 - Review Complete 10/25/2017  Allergen Reaction Noted  . Feraheme [ferumoxytol]  10/09/2017  . Nsaids Other (See Comments) 04/25/2014  . Tomato Hives 01/01/2012  . Wasp venom Swelling 10/06/2017   Past Medical History:  Diagnosis Date  . Anxiety   . Arthritis    knnes,back  . GERD (gastroesophageal reflux disease)   . History of swelling of feet   . Hyperlipidemia   . Hypertension   . Major depressive disorder, recurrent episode ([place]) 06/05/2015  . Obesity   . Snores     Family History: ovarian cancer, DM, kidney disease (mother), bleeding disorder  (mother, sister, son)   Social History: 15 pack year smoking history; currently smokes .5 ppd; no alcohol or illicit drug use  Review of Systems: A complete ROS was negative except as per HPI.   Physical Exam: Blood pressure 114/70, pulse 78, temperature 99 F (37.2 C), resp. rate 20, height 5' 5.5" (1.664 m), weight 257 lb (116.6 kg), SpO2 99 %. General: awake, alert, fatigued appearing female lying in bed in NAD HEENT:  conjunctival pallor; telangiectasias seen on roof of mouth  Cardiovascular: RRR; SEM over LUSB Abdominal: bowel sounds +; abdomen is soft, non-tender, non-distended Ext: no edema Skin: warm and dry    Assessment & Plan by Problem: Active Problems:   Upper GI bleed Acute on chronic symptomatic anemia in the setting of HHT Iron deficiency anemia.  Hgb on admission is 4.6. 3 units of pRBCs have been ordered and will follow-up H&H. Will plan to transfuse to keep hgb ~8. GI has been consulted and are performing another tagged RBC scan to look for active bleeding.  She is currently hemodynamically stable; will monitor on telemetry Will keep her NPO pending possible EGD Continue medical management for chronic anemia once taking PO.   DVT prophylaxis: pharmaceutical contraindicated at this time with ongoing blood loss. She is also at risk for VTE due to [place]. Will place her on SCDs.   Dispo: Admit patient to Inpatient with expected length of stay greater than 2 midnights.  SignedDelice Bison, DO 10/25/2017, 12:58 PM  Pager: [phone]

## 2017-10-25 NOTE — Consult Note (Signed)
Reason for Consult:GI bleeding Referring Physician: Hospital team  Peggy House is an 57 y.o. female.  HPI: patient well-known to our practice with multiple hospital admissions with GI blood loss and also nosebleeds with multiple workups presents now with bright red blood per rectum and recurrent worsening anemia and she denies anything new or different at home and has not been on any aspirin and nonsteroidals and her previous GI workup was reviewed including capsule endoscopy and she was initially referred to [facility] but subsequently that was changed to [facility] unfortunately they cannot see her till August and she is admitted for further workup and plans  Past Medical History:  Diagnosis Date  . Anxiety   . Arthritis    knnes,back  . GERD (gastroesophageal reflux disease)   . History of swelling of feet   . Hyperlipidemia   . Hypertension   . Major depressive disorder, recurrent episode ([place]) 06/05/2015  . Obesity   . Snores     Past Surgical History:  Procedure Laterality Date  . ABDOMINAL HYSTERECTOMY    . CARPAL TUNNEL RELEASE  05/13/2011   Procedure: CARPAL TUNNEL RELEASE;  Surgeon: Nita Sells, MD;  Location: [place];  Service: Orthopedics;  Laterality: Left;  . COLONOSCOPY WITH PROPOFOL N/A 04/28/2014   Procedure: COLONOSCOPY WITH PROPOFOL;  Surgeon: Cleotis Nipper, MD;  Location: [facility] ENDOSCOPY;  Service: Endoscopy;  Laterality: N/A;  . DG TOES*L*  2/10   rt  . DILATION AND CURETTAGE OF UTERUS    . ENTEROSCOPY N/A 10/17/2017   Procedure: ENTEROSCOPY;  Surgeon: Otis Brace, MD;  Location: [place] ENDOSCOPY;  Service: Gastroenterology;  Laterality: N/A;  . ESOPHAGOGASTRODUODENOSCOPY N/A 04/10/2014   Procedure: ESOPHAGOGASTRODUODENOSCOPY (EGD);  Surgeon: [name], MD;  Location: [facility] ENDOSCOPY;  Service: Endoscopy;  Laterality: N/A;  . ESOPHAGOGASTRODUODENOSCOPY N/A 05/10/2017   Procedure: ESOPHAGOGASTRODUODENOSCOPY (EGD);  Surgeon:  Ronald Lobo, MD;  Location: [facility] ENDOSCOPY;  Service: Endoscopy;  Laterality: N/A;  . ESOPHAGOGASTRODUODENOSCOPY N/A 09/22/2017   Procedure: ESOPHAGOGASTRODUODENOSCOPY (EGD);  Surgeon: Clarene Essex, MD;  Location: [place];  Service: Endoscopy;  Laterality: N/A;  bedside  . ESOPHAGOGASTRODUODENOSCOPY (EGD) WITH PROPOFOL N/A 04/27/2014   Procedure: ESOPHAGOGASTRODUODENOSCOPY (EGD) WITH PROPOFOL;  Surgeon: Cleotis Nipper, MD;  Location: [place];  Service: Endoscopy;  Laterality: N/A;  possible apc  . ESOPHAGOGASTRODUODENOSCOPY (EGD) WITH PROPOFOL N/A 09/30/2017   Procedure: ESOPHAGOGASTRODUODENOSCOPY (EGD) WITH PROPOFOL;  Surgeon: [name], MD;  Location: [place];  Service: Gastroenterology;  Laterality: N/A;  . ESOPHAGOGASTRODUODENOSCOPY (EGD) WITH PROPOFOL N/A 10/01/2017   Procedure: ESOPHAGOGASTRODUODENOSCOPY (EGD) WITH PROPOFOL;  Surgeon: [name], MD;  Location: [place];  Service: Gastroenterology;  Laterality: N/A;  . ESOPHAGOGASTRODUODENOSCOPY (EGD) WITH PROPOFOL N/A 10/08/2017   Procedure: ESOPHAGOGASTRODUODENOSCOPY (EGD) WITH PROPOFOL;  Surgeon: Otis Brace, MD;  Location: [facility] ENDOSCOPY;  Service: Gastroenterology;  Laterality: N/A;  . ESOPHAGOGASTRODUODENOSCOPY (EGD) WITH PROPOFOL N/A 10/17/2017   Procedure: ESOPHAGOGASTRODUODENOSCOPY (EGD) WITH PROPOFOL;  Surgeon: Otis Brace, MD;  Location: [facility] ENDOSCOPY;  Service: Gastroenterology;  Laterality: N/A;  . ESOPHAGOGASTRODUODENOSCOPY (EGD) WITH PROPOFOL N/A 10/19/2017   Procedure: ESOPHAGOGASTRODUODENOSCOPY (EGD) WITH PROPOFOL;  Surgeon: Otis Brace, MD;  Location: [facility] ENDOSCOPY;  Service: Gastroenterology;  Laterality: N/A;  . GIVENS CAPSULE STUDY N/A 10/02/2017   Procedure: GIVENS CAPSULE STUDY;  Surgeon: [name], MD;  Location: [place];  Service: Gastroenterology;  Laterality: N/A;  . GIVENS CAPSULE STUDY N/A 10/08/2017   Procedure: GIVENS CAPSULE STUDY;  Surgeon: Otis Brace, MD;  Location:  [place];  Service: Gastroenterology;  Laterality: N/A;  endoscopic placement of capsule  . HOT HEMOSTASIS N/A 04/27/2014   Procedure: HOT HEMOSTASIS (ARGON PLASMA COAGULATION/BICAP);  Surgeon: Cleotis Nipper, MD;  Location: [facility] ENDOSCOPY;  Service: Endoscopy;  Laterality: N/A;  . HOT HEMOSTASIS N/A 09/30/2017   Procedure: HOT HEMOSTASIS (ARGON PLASMA COAGULATION/BICAP);  Surgeon: [name], MD;  Location: [place];  Service: Gastroenterology;  Laterality: N/A;  . HOT HEMOSTASIS N/A 10/01/2017   Procedure: HOT HEMOSTASIS (ARGON PLASMA COAGULATION/BICAP);  Surgeon: [name], MD;  Location: [place];  Service: Gastroenterology;  Laterality: N/A;  . HOT HEMOSTASIS N/A 10/17/2017   Procedure: HOT HEMOSTASIS (ARGON PLASMA COAGULATION/BICAP);  Surgeon: Otis Brace, MD;  Location: [facility] ENDOSCOPY;  Service: Gastroenterology;  Laterality: N/A;  . HOT HEMOSTASIS N/A 10/19/2017   Procedure: HOT HEMOSTASIS (ARGON PLASMA COAGULATION/BICAP);  Surgeon: Otis Brace, MD;  Location: [facility] ENDOSCOPY;  Service: Gastroenterology;  Laterality: N/A;  . L shoulder Surgery  2011  . SUBMUCOSAL INJECTION  09/22/2017   Procedure: SUBMUCOSAL INJECTION;  Surgeon: Clarene Essex, MD;  Location: [facility] ENDOSCOPY;  Service: Endoscopy;;    Family History  Problem Relation Age of Onset  . Cancer Mother        Ovarian  . Ovarian cancer Mother   . Diabetes Mother   . Kidney disease Mother   . Bleeding Disorder Mother   . Diabetes type II Sister   . Bleeding Disorder Sister   . Diabetes Sister   . Colon cancer Maternal Grandfather   . Crohn's disease Maternal Grandfather   . Stomach cancer Maternal Grandmother   . Kidney disease Son   . Bleeding Disorder Son   . Dysmenorrhea Neg Hx     Social History:  reports that she has been smoking cigarettes.  She has a 15.00 pack-year smoking history. She quit smokeless tobacco use about 38 years ago. Her smokeless tobacco use included snuff. She reports that she does  not drink alcohol or use drugs.  Allergies:  Allergies  Allergen Reactions  . Feraheme [Ferumoxytol]     Muscle tetany, encephalopathy, fatigue, nausea  . Nsaids Other (See Comments)    Stomach bleeding episodes; Tylenol is OK  . Tomato Hives  . Wasp Venom Swelling    Medications: I have reviewed the patient's current medications.  Results for orders placed or performed during the hospital encounter of 10/25/17 (from the past 48 hour(s))  Comprehensive metabolic panel     Status: Abnormal   Collection Time: 10/25/17  7:57 AM  Result Value Ref Range   Sodium 144 135 - 145 mmol/L   Potassium 3.5 3.5 - 5.1 mmol/L   Chloride 117 (H) 98 - 111 mmol/L    Comment: Please note change in reference range.   CO2 20 (L) 22 - 32 mmol/L   Glucose, Bld 116 (H) 70 - 99 mg/dL    Comment: Please note change in reference range.   BUN 14 6 - 20 mg/dL    Comment: Please note change in reference range.   Creatinine, Ser 0.71 0.44 - 1.00 mg/dL   Calcium 7.5 (L) 8.9 - 10.3 mg/dL   Total Protein 3.6 (L) 6.5 - 8.1 g/dL   Albumin 1.9 (L) 3.5 - 5.0 g/dL   AST 16 15 - 41 U/L   ALT 8 0 - 44 U/L    Comment: Please note change in reference range.   Alkaline Phosphatase 48 38 - 126 U/L   Total Bilirubin 0.3 0.3 - 1.2 mg/dL   GFR calc  non Af Amer >60 >60 mL/min   GFR calc Af Amer >60 >60 mL/min    Comment: (NOTE) The eGFR has been calculated using the CKD EPI equation. This calculation has not been validated in all clinical situations. eGFR's persistently <60 mL/min signify possible Chronic Kidney Disease.    Anion gap 7 5 - 15    Comment: Performed at [facility] [address], [place], [place] [address]  CBC     Status: Abnormal   Collection Time: 10/25/17  7:57 AM  Result Value Ref Range   WBC 16.4 (H) 4.0 - 10.5 K/uL   RBC 1.63 (L) 3.87 - 5.11 MIL/uL   Hemoglobin 4.6 (LL) 12.0 - 15.0 g/dL    Comment: REPEATED TO VERIFY CRITICAL RESULT CALLED TO, READ BACK BY AND VERIFIED WITH: A  MCKOEWN,RN 0850 10/25/17 D BRADLEY    HCT 16.4 (L) 36.0 - 46.0 %   MCV 100.6 (H) 78.0 - 100.0 fL   MCH 28.2 26.0 - 34.0 pg   MCHC 28.0 (L) 30.0 - 36.0 g/dL   RDW 18.6 (H) 11.5 - 15.5 %   Platelets 249 150 - 400 K/uL    Comment: Performed at [facility], [place] [address], [place], [place] [address]  Type and screen [place]     Status: None (Preliminary result)   Collection Time: 10/25/17  8:25 AM  Result Value Ref Range   ABO/RH(D) A NEG    Antibody Screen NEG    Sample Expiration 10/28/2017    Unit Number [id number]    Blood Component Type RBC LR PHER2    Unit division 00    Status of Unit ISSUED    Transfusion Status OK TO TRANSFUSE    Crossmatch Result Compatible    Unit Number [id number]    Blood Component Type RED CELLS,LR    Unit division 00    Status of Unit ISSUED    Transfusion Status OK TO TRANSFUSE    Crossmatch Result      Compatible Performed at [facility], [place] [address] [place], [place] [address]    Unit Number [id number]    Blood Component Type RED CELLS,LR    Unit division 00    Status of Unit REL FROM [facility]    Transfusion Status OK TO TRANSFUSE    Crossmatch Result COMPATIBLE    Unit Number [id number]    Blood Component Type RED CELLS,LR    Unit division 00    Status of Unit REL FROM [facility]    Transfusion Status OK TO TRANSFUSE    Crossmatch Result COMPATIBLE    Unit Number [id number]    Blood Component Type RED CELLS,LR    Unit division 00    Status of Unit REL FROM [facility]    Transfusion Status OK TO TRANSFUSE    Crossmatch Result COMPATIBLE    Unit Number [id number]    Blood Component Type RED CELLS,LR    Unit division 00    Status of Unit REL FROM [facility]    Transfusion Status OK TO TRANSFUSE    Crossmatch Result COMPATIBLE    Unit Number [id number]    Blood Component Type RED CELLS,LR    Unit division 00    Status of Unit ALLOCATED    Transfusion Status OK TO TRANSFUSE    Crossmatch  Result Compatible    Unit Number [id number]    Blood Component Type RED CELLS,LR    Unit division 00  Status of Unit ALLOCATED    Transfusion Status OK TO TRANSFUSE    Crossmatch Result Compatible    Unit Number [id number]    Blood Component Type RED CELLS,LR    Unit division 00    Status of Unit ALLOCATED    Transfusion Status OK TO TRANSFUSE    Crossmatch Result Compatible    Unit Number [id number]    Blood Component Type RBC LR PHER2    Unit division 00    Status of Unit ALLOCATED    Transfusion Status OK TO TRANSFUSE    Crossmatch Result Compatible   I-Stat beta hCG blood, ED     Status: None   Collection Time: 10/25/17  8:35 AM  Result Value Ref Range   I-stat hCG, quantitative <5.0 <5 mIU/mL   Comment 3            Comment:   GEST. AGE      CONC.  (mIU/mL)   <=1 WEEK        5 - 50     2 WEEKS       50 - 500     3 WEEKS       100 - 10,000     4 WEEKS     1,000 - 30,000        FEMALE AND NON-PREGNANT FEMALE:     LESS THAN 5 mIU/mL   Prepare RBC     Status: None   Collection Time: 10/25/17  9:08 AM  Result Value Ref Range   Order Confirmation      ORDER PROCESSED BY BLOOD BANK Performed at [facility], [place] [address] [place] [address] [place], [place] [address]   POC occult blood, ED     Status: Abnormal   Collection Time: 10/25/17  9:16 AM  Result Value Ref Range   Fecal Occult Bld POSITIVE (A) NEGATIVE  Prepare RBC     Status: None   Collection Time: 10/25/17  1:00 PM  Result Value Ref Range   Order Confirmation      ORDER PROCESSED BY BLOOD BANK Performed at [facility], [place] [address], [place], [place] [address]   Hemoglobin and hematocrit, blood     Status: Abnormal   Collection Time: 10/25/17  2:17 PM  Result Value Ref Range   Hemoglobin 5.5 (LL) 12.0 - 15.0 g/dL    Comment: REPEATED TO VERIFY CRITICAL VALUE NOTED.  VALUE IS CONSISTENT WITH PREVIOUSLY REPORTED AND CALLED VALUE.    HCT 18.5 (L) 36.0 - 46.0 %    Comment: Performed at [facility], [place] [address] [place], [place] [address]    Nm Gi Blood Loss  Result Date: 10/25/2017 CLINICAL DATA:  GI bleed with blood per rectum. EXAM: NUCLEAR MEDICINE GASTROINTESTINAL BLEEDING SCAN TECHNIQUE: Sequential abdominal images were obtained following intravenous administration of Tc-10mlabeled red blood cells. RADIOPHARMACEUTICALS:  26.1 mCi Tc-91mertechnetate in-vitro labeled red cells. COMPARISON:  10/19/2017 FINDINGS: Imaging was performed over a total of 2 hours. There is no evidence of acute bleeding over this time interval in the gastrointestinal tract. Accumulation in the midline pelvis is consistent with bladder activity. IMPRESSION: Negative for active GI bleed over 2 hours of imaging. Electronically Signed   By: GlAletta Edouard.D.   On: 10/25/2017 14:05    ROSnegative except above Blood pressure 109/63, pulse 77, temperature 98.8 F (37.1 C), resp. rate 18, height 5' 5.5" (1.664 m), weight 116.6 kg (257 lb), SpO2 100 %. 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

## 2017-10-25 NOTE — ED Provider Notes (Signed)
[place] EMERGENCY DEPARTMENT Provider Note   CSN: [id number] Arrival date & time: 10/25/17  0745     History   Chief Complaint Chief Complaint  Patient presents with  . GI Problem    HPI MATTIE NORDELL is a 57 y.o. 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 She also had an endoscopic capsule placed that showed non bleeding small bowel AVMs. The last two admissions, she was started on tranexamic acid with only transient suppression of bleeding. When she presented this admission on 7/4, she was sent from heme/onc office with a 3 day history of melena and passing dark red clots. Her hgb was 5.6 in the office. On arrival to the ED she was hypotensive and repeat hgb was 3.7. She responded well to initial transfusion of 3u pRBCs and IVF; hematology started her on tamoxifen '20mg'$  daily and Amicar 1g QID. Unfortunately, patient continued to have intermittent melena, hematochezia and need for transfusions. She underwent EGD 7/5 and was found to have recently bleeding angioectasias: 2 in the stomach, one in the duodenum, and multiple in the jejunum s/p APC. EGD 7/7 after episode of hematemesis revealed no active bleeding, however there were a few gastric AVMs that were treated with APC. Tagged RBC scan afterwards did not reveal any further ongoing GI bleeding. She was transfused with 7 units of pRBCs this admission. GI initiated transfer to [facility] for double balloon endoscopy, as they suspect she likely has bleeding further down in her small bowel. Unfortunately, they were unable to take her  at this time and she was recommended to follow-up as an outpatient to schedule this procedure.   She reports that since her last discharge, she has not followed-up with [facility] and is unaware of a future appointment. She states that last night she began to have bright red blood per rectum. This has continued throughout the night and today. She denies vomiting, hemoptysis, and hematochezia. She reports light-headedness when walking around.  She denies chest pain, SOB, fever, cough, and congestion. She denies diarrhea, abdominal pain.   Past Medical History:  Diagnosis Date  . Anxiety   . Arthritis    knnes,back  . GERD (gastroesophageal reflux disease)   . History of swelling of feet   . Hyperlipidemia   . Hypertension   . Major depressive disorder, recurrent episode ([place]) 06/05/2015  . Obesity   . Snores     Patient Active Problem List   Diagnosis Date Noted  . Pulmonary artery hypertension ([place]) 10/19/2017  . Chronic anemia 05/20/2017  . 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 Venous stasis dermatitis of both lower extremities 05/07/2017  . Recurrent epistaxis 11/29/2016  . Iron deficiency anemia 10/29/2016  . Osteoarthritis, multiple sites 06/05/2015  . Insomnia 06/05/2015  . Major depressive disorder, recurrent episode ([place]) 06/05/2015  . Morbid obesity ([place]) 04/26/2014  . Gastric AVM 04/26/2014  . GI bleed 04/25/2014  . HLD (hyperlipidemia) 04/25/2014  . Leg swelling 04/25/2014  . Essential hypertension   . Acute blood loss anemia 04/07/2014    Past Surgical History:  Procedure Laterality Date  . ABDOMINAL HYSTERECTOMY    .  CARPAL TUNNEL RELEASE  05/13/2011   Procedure: CARPAL TUNNEL RELEASE;  Surgeon: Nita Sells, MD;  Location: [facility];  Service: Orthopedics;  Laterality: Left;  . COLONOSCOPY WITH PROPOFOL N/A 04/28/2014   Procedure: COLONOSCOPY WITH PROPOFOL;  Surgeon: Cleotis Nipper, MD;  Location: [facility] ENDOSCOPY;   Service: Endoscopy;  Laterality: N/A;  . DG TOES*L*  2/10   rt  . DILATION AND CURETTAGE OF UTERUS    . ENTEROSCOPY N/A 10/17/2017   Procedure: ENTEROSCOPY;  Surgeon: Otis Brace, MD;  Location: [facility] ENDOSCOPY;  Service: Gastroenterology;  Laterality: N/A;  . ESOPHAGOGASTRODUODENOSCOPY N/A 04/10/2014   Procedure: ESOPHAGOGASTRODUODENOSCOPY (EGD);  Surgeon: [name], MD;  Location: [facility] ENDOSCOPY;  Service: Endoscopy;  Laterality: N/A;  . ESOPHAGOGASTRODUODENOSCOPY N/A 05/10/2017   Procedure: ESOPHAGOGASTRODUODENOSCOPY (EGD);  Surgeon: Ronald Lobo, MD;  Location: [facility] ENDOSCOPY;  Service: Endoscopy;  Laterality: N/A;  . ESOPHAGOGASTRODUODENOSCOPY N/A 09/22/2017   Procedure: ESOPHAGOGASTRODUODENOSCOPY (EGD);  Surgeon: Clarene Essex, MD;  Location: [place];  Service: Endoscopy;  Laterality: N/A;  bedside  . ESOPHAGOGASTRODUODENOSCOPY (EGD) WITH PROPOFOL N/A 04/27/2014   Procedure: ESOPHAGOGASTRODUODENOSCOPY (EGD) WITH PROPOFOL;  Surgeon: Cleotis Nipper, MD;  Location: [place];  Service: Endoscopy;  Laterality: N/A;  possible apc  . ESOPHAGOGASTRODUODENOSCOPY (EGD) WITH PROPOFOL N/A 09/30/2017   Procedure: ESOPHAGOGASTRODUODENOSCOPY (EGD) WITH PROPOFOL;  Surgeon: [name], MD;  Location: [place];  Service: Gastroenterology;  Laterality: N/A;  . ESOPHAGOGASTRODUODENOSCOPY (EGD) WITH PROPOFOL N/A 10/01/2017   Procedure: ESOPHAGOGASTRODUODENOSCOPY (EGD) WITH PROPOFOL;  Surgeon: [name], MD;  Location: [place];  Service: Gastroenterology;  Laterality: N/A;  . ESOPHAGOGASTRODUODENOSCOPY (EGD) WITH PROPOFOL N/A 10/08/2017   Procedure: ESOPHAGOGASTRODUODENOSCOPY (EGD) WITH PROPOFOL;  Surgeon: Otis Brace, MD;  Location: [facility] ENDOSCOPY;  Service: Gastroenterology;  Laterality: N/A;  . ESOPHAGOGASTRODUODENOSCOPY (EGD) WITH PROPOFOL N/A 10/17/2017   Procedure: ESOPHAGOGASTRODUODENOSCOPY (EGD) WITH PROPOFOL;  Surgeon: Otis Brace, MD;  Location: [facility] ENDOSCOPY;  Service:  Gastroenterology;  Laterality: N/A;  . ESOPHAGOGASTRODUODENOSCOPY (EGD) WITH PROPOFOL N/A 10/19/2017   Procedure: ESOPHAGOGASTRODUODENOSCOPY (EGD) WITH PROPOFOL;  Surgeon: Otis Brace, MD;  Location: [facility] ENDOSCOPY;  Service: Gastroenterology;  Laterality: N/A;  . GIVENS CAPSULE STUDY N/A 10/02/2017   Procedure: GIVENS CAPSULE STUDY;  Surgeon: [name], MD;  Location: [place];  Service: Gastroenterology;  Laterality: N/A;  . GIVENS CAPSULE STUDY N/A 10/08/2017   Procedure: GIVENS CAPSULE STUDY;  Surgeon: Otis Brace, MD;  Location: [place];  Service: Gastroenterology;  Laterality: N/A;  endoscopic placement of capsule  . HOT HEMOSTASIS N/A 04/27/2014   Procedure: HOT HEMOSTASIS (ARGON PLASMA COAGULATION/BICAP);  Surgeon: Cleotis Nipper, MD;  Location: [facility] ENDOSCOPY;  Service: Endoscopy;  Laterality: N/A;  . HOT HEMOSTASIS N/A 09/30/2017   Procedure: HOT HEMOSTASIS (ARGON PLASMA COAGULATION/BICAP);  Surgeon: [name], MD;  Location: [place];  Service: Gastroenterology;  Laterality: N/A;  . HOT HEMOSTASIS N/A 10/01/2017   Procedure: HOT HEMOSTASIS (ARGON PLASMA COAGULATION/BICAP);  Surgeon: [name], MD;  Location: [place];  Service: Gastroenterology;  Laterality: N/A;  . HOT HEMOSTASIS N/A 10/17/2017   Procedure: HOT HEMOSTASIS (ARGON PLASMA COAGULATION/BICAP);  Surgeon: Otis Brace, MD;  Location: [facility] ENDOSCOPY;  Service: Gastroenterology;  Laterality: N/A;  . HOT HEMOSTASIS N/A 10/19/2017   Procedure: HOT HEMOSTASIS (ARGON PLASMA COAGULATION/BICAP);  Surgeon: Otis Brace, MD;  Location: [facility] ENDOSCOPY;  Service: Gastroenterology;  Laterality: N/A;  . L shoulder Surgery  2011  . SUBMUCOSAL INJECTION  09/22/2017   Procedure: SUBMUCOSAL INJECTION;  Surgeon: Clarene Essex, MD;  Location: [place];  Service:  Endoscopy;;     OB History   None      Home Medications    Prior to Admission medications   Medication Sig Start Date End Date Taking?  Authorizing Provider  albuterol (PROVENTIL HFA;VENTOLIN HFA) 108 (90 Base) MCG/ACT inhaler Inhale 1-2 puffs into the lungs every 6 (six) hours as needed for wheezing or shortness of breath. 04/03/17   Molt, Bethany, DO  aminocaproic acid (AMICAR) 500 MG tablet Take 2 tablets (1,000 mg total) by mouth every 4 (four) hours while awake. 10/22/17   [place], Carley D, DO  atorvastatin (LIPITOR) 80 MG tablet take 1 tablet by mouth once daily 01/11/17   Molt, Bethany, DO  ferrous gluconate (FERGON) 324 MG tablet Take 1 tablet (324 mg total) by mouth 3 (three) times daily with meals. Patient taking differently: Take 324 mg by mouth daily with breakfast.  12/25/16 09/29/17  Ardath Sax, MD  pantoprazole (PROTONIX) 40 MG tablet Take 1 tablet (40 mg total) by mouth 2 (two) times daily. 10/10/17   Katherine Roan, MD  sertraline (ZOLOFT) 50 MG tablet Take 1 tablet (50 mg total) by mouth daily. 09/09/17   Molt, Bethany, DO  tamoxifen (NOLVADEX) 20 MG tablet Take 1 tablet (20 mg total) by mouth daily. Patient not taking: Reported on 10/15/2017 10/15/17   Alla Feeling, NP    Family History Family History  Problem Relation Age of Onset  . Cancer Mother        Ovarian  . Ovarian cancer Mother   . Diabetes Mother   . Kidney disease Mother   . Bleeding Disorder Mother   . Diabetes type II Sister   . Bleeding Disorder Sister   . Diabetes Sister   . Colon cancer Maternal Grandfather   . Crohn's disease Maternal Grandfather   . Stomach cancer Maternal Grandmother   . Kidney disease Son   . Bleeding Disorder Son   . Dysmenorrhea Neg Hx     Social History Social History   Tobacco Use  . Smoking status: Current Every Day Smoker    Packs/day: 0.50    Years: 30.00    Pack years: 15.00    Types: Cigarettes  . Smokeless tobacco: Former Systems developer    Types: Snuff    Quit date: 53  . Tobacco comment: 1/2 PPD  Substance Use Topics  . Alcohol use: No    Alcohol/week: 0.0 oz  . Drug use: No     Comment: last cocaine-2010     Allergies   Feraheme [ferumoxytol]; Nsaids; Tomato; and Wasp venom   Review of Systems Review of Systems  Constitutional: Negative for activity change, chills, diaphoresis and fever.  HENT: Negative for congestion and sore throat.   Respiratory: Negative for cough, choking, chest tightness and shortness of breath.   Cardiovascular: Negative for chest pain and palpitations.  Gastrointestinal: Positive for anal bleeding and blood in stool. Negative for abdominal distention, abdominal pain, constipation, diarrhea and vomiting.  Genitourinary: Positive for dysuria and hematuria. Negative for difficulty urinating.  Skin: Negative for rash.  Neurological: Positive for light-headedness. Negative for dizziness, weakness and headaches.  All other systems reviewed and are negative.    Physical Exam Updated Vital Signs BP (!) 99/53 (BP Location: Left Arm)   Pulse 84   Temp 99 F (37.2 C) (Oral)   Resp 18   Ht 5' 5.5" (1.664 m)   Wt 116.6 kg (257 lb)   SpO2 100%   BMI 42.12 kg/m   Physical  Exam  Constitutional: She appears well-developed and well-nourished.  HENT:  Head: Normocephalic and atraumatic.  Eyes: Pupils are equal, round, and reactive to light. EOM are normal.  Pale palpebral conjunctiva.  Cardiovascular: Normal rate, regular rhythm, normal heart sounds and intact distal pulses.  Pulmonary/Chest: Effort normal and breath sounds normal.  Abdominal: Soft. Bowel sounds are normal.  Genitourinary: Rectal exam shows guaiac positive stool. Rectal exam shows no fissure, no mass, no tenderness and anal tone normal.  Neurological: She is alert.  Skin: Skin is warm and dry.  Psychiatric: She has a normal mood and affect. Her behavior is normal.  Nursing note and vitals reviewed.    ED Treatments / Results  Labs (all labs ordered are listed, but only abnormal results are displayed) Labs Reviewed  COMPREHENSIVE METABOLIC PANEL - Abnormal;  Notable for the following components:      Result Value   Chloride 117 (*)    CO2 20 (*)    Glucose, Bld 116 (*)    Calcium 7.5 (*)    Total Protein 3.6 (*)    Albumin 1.9 (*)    All other components within normal limits  CBC - Abnormal; Notable for the following components:   WBC 16.4 (*)    RBC 1.63 (*)    Hemoglobin 4.6 (*)    HCT 16.4 (*)    MCV 100.6 (*)    MCHC 28.0 (*)    RDW 18.6 (*)    All other components within normal limits  HEMOGLOBIN AND HEMATOCRIT, BLOOD - Abnormal; Notable for the following components:   Hemoglobin 5.5 (*)    HCT 18.5 (*)    All other components within normal limits  POC OCCULT BLOOD, ED - Abnormal; Notable for the following components:   Fecal Occult Bld POSITIVE (*)    All other components within normal limits  HEMOGLOBIN AND HEMATOCRIT, BLOOD  I-STAT BETA HCG BLOOD, ED ([facility], [facility], [facility] ONLY)  TYPE AND SCREEN  PREPARE RBC (CROSSMATCH)  PREPARE RBC (CROSSMATCH)    EKG None  Radiology Nm Gi Blood Loss  Result Date: 10/25/2017 CLINICAL DATA:  GI bleed with blood per rectum. EXAM: NUCLEAR MEDICINE GASTROINTESTINAL BLEEDING SCAN TECHNIQUE: Sequential abdominal images were obtained following intravenous administration of Tc-69mlabeled red blood cells. RADIOPHARMACEUTICALS:  26.1 mCi Tc-981mertechnetate in-vitro labeled red cells. COMPARISON:  10/19/2017 FINDINGS: Imaging was performed over a total of 2 hours. There is no evidence of acute bleeding over this time interval in the gastrointestinal tract. Accumulation in the midline pelvis is consistent with bladder activity. IMPRESSION: Negative for active GI bleed over 2 hours of imaging. Electronically Signed   By: GlAletta Edouard.D.   On: 10/25/2017 14:05    Procedures Procedures (including critical care time)  Medications Ordered in ED Medications  aminocaproic acid (AMICAR) tablet 1,000 mg (1,000 mg Oral Given 10/25/17 1635)  ferrous gluconate (FERGON) tablet 324 mg (324 mg Oral Given  10/25/17 1635)  tamoxifen (NOLVADEX) tablet 20 mg (20 mg Oral Given 10/25/17 1636)  sertraline (ZOLOFT) tablet 50 mg (has no administration in time range)  pantoprazole (PROTONIX) EC tablet 40 mg (40 mg Oral Given 10/25/17 1638)  0.9 %  sodium chloride infusion (0 mL/hr Intravenous Stopped 10/25/17 0953)  sodium chloride 0.9 % bolus 1,000 mL (1,000 mLs Intravenous New Bag/Given 10/25/17 1427)  technetium labeled red blood cells (ULTRATAG) injection kit 2610.9illicurie (2632.3illicuries Intravenous Contrast Given 10/25/17 1120)  0.9 %  sodium chloride infusion (Manually program via Guardrails IV  Fluids) ( Intravenous Given 10/25/17 1400)     Initial Impression / Assessment and Plan / ED Course  I have reviewed the triage vital signs and the nursing notes.  Pertinent labs & imaging results that were available during my care of the patient were reviewed by me and considered in my medical decision making (see chart for details).  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 Patient has a long history of GI bleeding. On her last admission (discharged 3 days ago), she was found to have gastric AVMs. GI initiated transfer to [facility] for double balloon endoscopy, as they suspect she likely has bleeding further down in her small bowel. Unfortunately, they were unable to take her at this time and she was recommended to follow-up as an outpatient to schedule this procedure.   On physical exam, patient was found to have guaiac positive stool, hypotension at 99/53, and anemia (Hgb 4.6). She was also found to have a leukocytosis of 16.4 without fever. Patient was given 2 units of pRBC in the ED. GI was consulted and a nuclear medicine GI blood loss study was ordered. Patient was admitted to the teaching service for further  workup and potential treatment.    Final Clinical Impressions(s) / ED Diagnoses   Final diagnoses:  None    ED Discharge Orders    None       Carroll Sage, MD 10/25/17 1705    Pattricia Boss, MD 10/26/17 8441    Pattricia Boss, MD 11/06/17 1055

## 2017-10-25 NOTE — Progress Notes (Signed)
Received report from [place], Therapist, sports in Ed

## 2017-10-26 ENCOUNTER — Other Ambulatory Visit: Payer: Self-pay

## 2017-10-26 DIAGNOSIS — I270 Primary pulmonary hypertension: Secondary | ICD-10-CM

## 2017-10-26 DIAGNOSIS — D62 Acute posthemorrhagic anemia: Principal | ICD-10-CM

## 2017-10-26 DIAGNOSIS — R110 Nausea: Secondary | ICD-10-CM

## 2017-10-26 DIAGNOSIS — K922 Gastrointestinal hemorrhage, unspecified: Secondary | ICD-10-CM

## 2017-10-26 DIAGNOSIS — Z8679 Personal history of other diseases of the circulatory system: Secondary | ICD-10-CM

## 2017-10-26 LAB — BASIC METABOLIC PANEL
Anion gap: 4 — ABNORMAL LOW (ref 5–15)
BUN: 16 mg/dL (ref 6–20)
CHLORIDE: 118 mmol/L — AB (ref 98–111)
CO2: 22 mmol/L (ref 22–32)
Calcium: 7.2 mg/dL — ABNORMAL LOW (ref 8.9–10.3)
Creatinine, Ser: 0.8 mg/dL (ref 0.44–1.00)
GFR calc Af Amer: >60 mL/min (ref >60)
GFR calc non Af Amer: >60 mL/min (ref >60)
GLUCOSE: 123 mg/dL — AB (ref 70–99)
POTASSIUM: 4 mmol/L (ref 3.5–5.1)
SODIUM: 144 mmol/L (ref 135–145)

## 2017-10-26 LAB — CBC
HEMATOCRIT: 19.9 % — AB (ref 36.0–46.0)
Hemoglobin: 6.5 g/dL — CL (ref 12.0–15.0)
MCH: 29.5 pg (ref 26.0–34.0)
MCHC: 32.7 g/dL (ref 30.0–36.0)
MCV: 90.5 fL (ref 78.0–100.0)
PLATELETS: 180 10*3/uL (ref 150–400)
RBC: 2.2 MIL/uL — ABNORMAL LOW (ref 3.87–5.11)
RDW: 16.8 % — ABNORMAL HIGH (ref 11.5–15.5)
WBC: 15.8 10*3/uL — AB (ref 4.0–10.5)

## 2017-10-26 LAB — HEMOGLOBIN AND HEMATOCRIT, BLOOD
HCT: 26.4 % — ABNORMAL LOW (ref 36.0–46.0)
Hemoglobin: 8.7 g/dL — ABNORMAL LOW (ref 12.0–15.0)

## 2017-10-26 LAB — PREPARE RBC (CROSSMATCH)

## 2017-10-26 MED ORDER — SODIUM CHLORIDE 0.9% IV SOLUTION: Freq: Once | INTRAVENOUS | Status: DC

## 2017-10-26 MED ORDER — SODIUM CHLORIDE 0.9 % IV BOLUS
1000.0000 mL | Freq: Once | INTRAVENOUS | Status: AC
  Administered 2017-10-26: 1000 mL via INTRAVENOUS

## 2017-10-26 NOTE — Discharge Summary (Signed)
Name: Peggy House MRN: [id number] DOB: Jul 25, 1960 57 y.o. PCP: Einar Gip, DO  Date of Admission: 10/25/2017  7:45 AM Date of Discharge: 10/26/2017 Attending Physician: Bartholomew Crews, MD  Discharge Diagnosis: 1. Acute anemia 2/2 GI bleeding, transfusion dependent 2. Iron deficiency anemia 2/2 acute blood loss 2. HHT 3. Elevated pulmonary artery pressures  Discharge Medications: Allergies as of 10/26/2017      Reactions   Feraheme [ferumoxytol]    Muscle tetany, encephalopathy, fatigue, nausea   Nsaids Other (See Comments)   Stomach bleeding episodes; Tylenol is OK   Tomato Hives   Wasp Venom Swelling      Medication List    TAKE these medications   albuterol 108 (90 Base) MCG/ACT inhaler Commonly known as:  PROVENTIL HFA;VENTOLIN HFA Inhale 1-2 puffs into the lungs every 6 (six) hours as needed for wheezing or shortness of breath.   aminocaproic acid 500 MG tablet Commonly known as:  AMICAR Take 2 tablets (1,000 mg total) by mouth every 4 (four) hours while awake.   atorvastatin 80 MG tablet Commonly known as:  LIPITOR take 1 tablet by mouth once daily   ferrous gluconate 324 MG tablet Commonly known as:  FERGON Take 1 tablet (324 mg total) by mouth 3 (three) times daily with meals. What changed:  when to take this   pantoprazole 40 MG tablet Commonly known as:  PROTONIX Take 1 tablet (40 mg total) by mouth 2 (two) times daily.   sertraline 50 MG tablet Commonly known as:  ZOLOFT Take 1 tablet (50 mg total) by mouth daily.   tamoxifen 20 MG tablet Commonly known as:  NOLVADEX Take 1 tablet (20 mg total) by mouth daily.       Disposition and follow-up:   [name] was discharged from [facility] in [place] condition.  At the hospital follow up visit please address:  1.   Acute anemia 2/2 GI bleeding 2/2 HHT: Presumed HHT with multiple AVMs found on endoscopy Transfer to [facility] for possible double balloon  enteroscopy S/p 7 units pRBC with hgb of 8.7 on discharge On tamoxifen 20mg  daily and amicar 1g qid (every 4 hours while awake), ferrous gluconate TID wc per Heme. Ensure f/u with Dr. Burr Medico (hematology)  Elevated pulmonary artery pressure: -needs outpt w/u with PFTs, VQ scan, sleep study, RH cath for evaluation of elevated pulmonary artery pressure  2.  Labs / imaging needed at time of follow-up: cbc  3.  Pending labs/ test needing follow-up: n/a  Follow-up Appointments:   Hospital Course by problem list:  Acute blood loss anemia 2/2 AVMs in setting of HHT: Patient has had 5 admissions over the last 6 weeks for recurrent GI bleeding 2/2 duodenal ulcer s/p clipping in June and 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 She is s/p 6 EGDs during this period. She failed traneximic acid therapy. During last admission 7/3-7/10 she was started on tamoxifen and amicar per her hematologist's recommendations (Dr. Burr Medico). She has had Tagged RBC scan yesterday was negative. During last admission transfer to [place] was attempted however unfortunately they were on diversion and planned to follow up with her on an outpatient basis. She returned 3 days later with a Hgb to 4.6 and ongoing GI bleeding. GI at [facility] was again consulted but had nothing further to offer due to the extent of her bleeding and again recommended transfer to tertiary center for possible double balloon enteroscopy. She received a total of 7 units  of pRBCs this admission. Of note she had adverse reaction in the past to IV ferraheme with tetany and unresponsiveness, but did tolerate IV ferric gluconate with pre-medication with benadryl.   H/o HTN: Patient's amlodipine was held during admission due to ongoing GI bleed and low BPs.   HHT She has had multiple admissions due to bleeding AVMs. It is recommended that patients with HHT undergo screening for pulmonary AVMs via bubble echo because of the high incidence of unsuspected  pulmonary AVMs and evidence that treatment reduces stroke and brain abscess risk in adults. Her results for this study revealed LVEF of 65-70%, moderate diastolic dysfunction, mild LVH, moderate left atrial enlargement, moderate TR with moderate to severe pulmonary HTN. No evidence of PFO or concern for right-to-left shunt. She will f/u with hematology, Dr. Burr Medico for further management.  Elevated pulmonary artery pressure Echocardiogram with bubble study did not mention findings consistent with pulmonary AVMs; it did reveal elevated pulmonary artery pressures (PA peak 54mmHg) and G2DD. LVEF was wnl. Further oupt w/u (PFTs, sleep study, possible V/Q scan (with HHT in setting of recurrent recent bleeding she is at risk for VTE as factors are depleted), RHC) is recommended to identify source of elevated pulmonary artery pressure.  Discharge Vitals:   BP 114/63 (BP Location: Left Arm)   Pulse 73   Temp 98.6 F (37 C) (Oral)   Resp 19   Ht 5\' 5"  (1.651 m)   Wt 248 lb 0.3 oz (112.5 kg)   SpO2 100%   BMI 41.27 kg/m   Pertinent Labs, Studies, and Procedures:  CBC Latest Ref Rng & Units 10/26/2017 10/26/2017 10/25/2017  WBC 4.0 - 10.5 K/uL - 15.8(H) -  Hemoglobin 12.0 - 15.0 g/dL 8.7(L) 6.5(LL) 5.5(LL)  Hematocrit 36.0 - 46.0 % 26.4(L) 19.9(L) 17.8(L)  Platelets 150 - 400 K/uL - 180 -   BMP Latest Ref Rng & Units 10/26/2017 10/25/2017 10/21/2017  Glucose 70 - 99 mg/dL 123(H) 116(H) 102(H)  BUN 6 - 20 mg/dL 16 14 9   Creatinine 0.44 - 1.00 mg/dL 0.80 0.71 0.89  BUN/Creat Ratio 9 - 23 - - -  Sodium 135 - 145 mmol/L 144 144 140  Potassium 3.5 - 5.1 mmol/L 4.0 3.5 3.5  Chloride 98 - 111 mmol/L 118(H) 117(H) 109  CO2 22 - 32 mmol/L 22 20(L) 23  Calcium 8.9 - 10.3 mg/dL 7.2(L) 7.5(L) 8.3(L)   Discharge Instructions: Discharge Instructions    Diet - low sodium heart healthy   Complete by:  As directed    Increase activity slowly   Complete by:  As directed       Signed: Ledell Noss,  MD 10/26/2017, 9:41 PM

## 2017-10-26 NOTE — Progress Notes (Signed)
Care link  called to transport patient to [facility] room number 3205 .

## 2017-10-26 NOTE — Progress Notes (Signed)
Report called to accepting RN Clinton Sawyer at [facility].

## 2017-10-26 NOTE — Progress Notes (Signed)
Received phone call from [place] from [facility] transfer center that patient had bed assignment room number 3205.Accepting MD Amy Dema Severin.Patient to go to [facility]  [place]. Will inform MD on and and patient.

## 2017-10-26 NOTE — Progress Notes (Addendum)
Peggy House 10:55 AM  Subjective: Patient was doing better this morning when I saw her and had not yet a  bowel movement from today and has no new complaints  Objective: Vital signs stable afebrile no acute distress abdomen is soft nontender hemoglobin increased slightly  Assessment: GI blood loss question will etiology  Plan: Would ask the house staff to continue to look into transferring her to either [facility] or [facility] otherwise I discussed the case with  my partner Dr. B who set up the outpatient evaluation which he says was done at [facility] for July 22 but the patient seems to think she's been contacted by [facility] for an appointment in August but I think she would be best served by being transferred to either facility as an inpatient for further workup and plans  [facility]  Pager [phone] After 5PM or if no answer call [phone]

## 2017-10-26 NOTE — Progress Notes (Signed)
Dr.Krienke aware of bed assignment at [facility].

## 2017-10-26 NOTE — Progress Notes (Signed)
   Subjective:  Patient endorses feeling nauseated this morning; overnight she had improvement in nausea with phenergan. She reports one bloody BM last night and one this morning. She denies vomiting, abdominal pain, chest pain, shortness of breath, dizziness. RN stated that overnight she was pre-syncopal when on the toilet and had to have assistance back to bed. She has been able to tolerate her PO meds.  Objective:  Vital signs in last 24 hours: Vitals:   10/26/17 0355 10/26/17 0415 10/26/17 0610 10/26/17 0619  BP: (!) 94/50 108/60 (!) 94/53 95/61  Pulse: 93 82    Resp: 16 17 (!) 21   Temp: 99.2 F (37.3 C)  99.2 F (37.3 C)   TempSrc: Oral  Oral   SpO2: 100% 99%    Weight:      Height:       Constitutional: Appears mildly uncomfortable CV: RRR Resp: CTAB, no increased work of breathing Abd: soft, NDNT, +BS Ext: no LE edema  Assessment/Plan:  Active Problems:   Acute on chronic blood loss anemia   Upper GI bleed  Upper GI bleed Acute on chronic blood loss anemia and iron deficiency anemia in setting of HHT: She has had 5 admissions over the last 6 weeks for recurrent GI bleeding 2/2 duodenal ulcer s/p clipping in June and 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 She is s/p 6 EGDs during this period. She failed traneximic acid therapy. During last admission 7/3-7/10 she was started on tamoxifen and amicar per her hematologist's recommendations (Dr. Burr Medico). She has had Tagged RBC scan yesterday was negative. Patient has ongoing blood loss. She is s/p 5 units pRBC with Hgb up to 6.5 this morning. Blood pressures are a little soft but she is currently asymptomatic.  --bolus 1L NS --transfuse another 2 units pRBC and f/u post-transfusion h/h --f/u GI recs, may need another EGD  --continue amicar, tamoxifen --will consider redosing IV ferric gluconate with pre-medication once she is a little more stable --SCDs for VTE prophylaxis, higher risk for VTE in HHT but  pharmaceutical prophylaxis is contraindicated currently  H/o HTN: Holding home amlodipine.  Elevated pulmonary artery pressure Evaluated during previous admissions, not current acute problem. Echocardiogram with bubble study did not mention findings consistent with pulmonary AVMs; it did reveal elevated pulmonary artery pressures (PA peak 62mmHg) and G2DD. LVEF was wnl. Further oupt w/u (PFTs, sleep study, possible V/Q scan (with HHT in setting of recurrent recent bleeding she is at risk for VTE as factors are depleted), RHC) is recommended to identify source of elevated pulmonary artery pressure.  Dispo: Anticipated discharge in approximately 2-5 day(s).   Alphonzo Grieve, MD 10/26/2017, 9:50 AM IMTS - PGY3 Pager [phone]

## 2017-10-26 NOTE — Progress Notes (Signed)
Lab informed not to collectt morning labs, pt has blood transfusing. RN will reschedule morning labs to appropriate time.

## 2017-10-26 NOTE — Progress Notes (Signed)
Call placed to on call MD about bed assignment at [facility] awaiting response.

## 2017-10-26 NOTE — Progress Notes (Signed)
Patient notified of impending transfer to [facility] and given room 3205.Patient is going to call family with room number.

## 2017-10-26 NOTE — Progress Notes (Signed)
Discussed with [facility], Dr. Lauralee Evener with hospitalist service will accept patient for transfer. He has already spoken with GI service at [facility] who agree to consult for possible double balloon enteroscopy. She will go to a stepdown bed. We will keep them updated if any significant changes occur in her status that would affect transfer.  Alphonzo Grieve, MD IMTS - PGY3 Pager [phone]

## 2017-10-27 ENCOUNTER — Ambulatory Visit: Payer: Medicaid Other | Admitting: Nurse Practitioner

## 2017-10-27 ENCOUNTER — Telehealth: Payer: Self-pay

## 2017-10-27 ENCOUNTER — Other Ambulatory Visit: Payer: Self-pay

## 2017-10-27 ENCOUNTER — Other Ambulatory Visit: Payer: Medicaid Other

## 2017-10-27 DIAGNOSIS — K31811 Angiodysplasia of stomach and duodenum with bleeding: Secondary | ICD-10-CM

## 2017-10-27 DIAGNOSIS — K922 Gastrointestinal hemorrhage, unspecified: Secondary | ICD-10-CM

## 2017-10-27 MED ORDER — ALBUTEROL SULFATE (5 MG/ML) 0.5% IN NEBU
2.50 | INHALATION_SOLUTION | RESPIRATORY_TRACT | Status: DC
Start: ? — End: 2017-10-27

## 2017-10-27 MED ORDER — GENERIC EXTERNAL MEDICATION
40.00 | Status: DC
Start: 2017-10-27 — End: 2017-10-27

## 2017-10-27 MED ORDER — MELATONIN 3 MG PO TABS
6.00 | ORAL_TABLET | ORAL | Status: DC
Start: ? — End: 2017-10-27

## 2017-10-27 MED ORDER — LIDOCAINE HCL 1 % IJ SOLN
0.50 | INTRAMUSCULAR | Status: DC
Start: ? — End: 2017-10-27

## 2017-10-27 MED ORDER — GENERIC EXTERNAL MEDICATION
Status: DC
Start: ? — End: 2017-10-27

## 2017-10-27 MED ORDER — AMINOCAPROIC ACID 500 MG PO TABS: 1000.0000 mg | ORAL_TABLET | ORAL | 1 refills | Status: DC

## 2017-10-27 MED ORDER — ACETAMINOPHEN 325 MG PO TABS
650.00 | ORAL_TABLET | ORAL | Status: DC
Start: ? — End: 2017-10-27

## 2017-10-27 NOTE — Telephone Encounter (Signed)
Called and left a message reminding her of appt today. Instructed to call if she has questions.

## 2017-10-27 NOTE — Telephone Encounter (Signed)
Patient cancelled HFU 10/24/2017. Readmitted 10/25/2017. Hubbard Hartshorn, RN, BSN

## 2017-10-27 NOTE — Progress Notes (Signed)
Pt discharged with all belongings via carelink.  Patient is alert and oriented times four with stable vital signs.

## 2017-10-27 NOTE — Progress Notes (Signed)
Patient left with care link to be transferred to [place] belongings sent with patient.No acute distress noted and patient denies pain and vital signs stable prior to leaving.

## 2017-10-28 ENCOUNTER — Ambulatory Visit: Payer: Medicaid Other

## 2017-10-28 DIAGNOSIS — K3182 Dieulafoy lesion (hemorrhagic) of stomach and duodenum: Secondary | ICD-10-CM | POA: Diagnosis not present

## 2017-10-28 DIAGNOSIS — D62 Acute posthemorrhagic anemia: Secondary | ICD-10-CM | POA: Diagnosis not present

## 2017-10-28 DIAGNOSIS — K3189 Other diseases of stomach and duodenum: Secondary | ICD-10-CM | POA: Diagnosis not present

## 2017-10-28 DIAGNOSIS — K922 Gastrointestinal hemorrhage, unspecified: Secondary | ICD-10-CM | POA: Diagnosis not present

## 2017-10-28 DIAGNOSIS — D509 Iron deficiency anemia, unspecified: Secondary | ICD-10-CM | POA: Diagnosis not present

## 2017-10-29 DIAGNOSIS — D62 Acute posthemorrhagic anemia: Secondary | ICD-10-CM | POA: Diagnosis not present

## 2017-10-29 NOTE — Telephone Encounter (Signed)
#  30 with 3 refills sent by outside provider on 10/15/2017. Patient admitted 10/25/2017. Hubbard Hartshorn, RN, BSN

## 2017-10-30 DIAGNOSIS — D62 Acute posthemorrhagic anemia: Secondary | ICD-10-CM | POA: Diagnosis not present

## 2017-10-30 LAB — TYPE AND SCREEN
ABO/RH(D): A NEG
Antibody Screen: NEGATIVE
UNIT DIVISION: 0
UNIT DIVISION: 0
UNIT DIVISION: 0
UNIT DIVISION: 0
Unit division: 0
Unit division: 0
Unit division: 0
Unit division: 0
Unit division: 0
Unit division: 0
Unit division: 0
Unit division: 0
Unit division: 0

## 2017-10-30 LAB — BPAM RBC
BLOOD PRODUCT EXPIRATION DATE: 201907192359
BLOOD PRODUCT EXPIRATION DATE: 201907192359
BLOOD PRODUCT EXPIRATION DATE: 201908012359
BLOOD PRODUCT EXPIRATION DATE: 201908032359
BLOOD PRODUCT EXPIRATION DATE: 201908052359
BLOOD PRODUCT EXPIRATION DATE: 201908062359
BLOOD PRODUCT EXPIRATION DATE: 201908102359
Blood Product Expiration Date: 201908032359
Blood Product Expiration Date: 201908042359
Blood Product Expiration Date: 201908062359
Blood Product Expiration Date: 201908062359
Blood Product Expiration Date: 201908062359
Blood Product Expiration Date: 201908102359
ISSUE DATE / TIME: 201907102348
ISSUE DATE / TIME: 201907130942
ISSUE DATE / TIME: 201907131424
ISSUE DATE / TIME: 201907131426
ISSUE DATE / TIME: 201907132025
ISSUE DATE / TIME: 201907132238
ISSUE DATE / TIME: 201907140023
ISSUE DATE / TIME: 201907140335
ISSUE DATE / TIME: 201907141107
ISSUE DATE / TIME: 201907141445
ISSUE DATE / TIME: 201907161658
UNIT TYPE AND RH: 600
UNIT TYPE AND RH: 600
UNIT TYPE AND RH: 6200
UNIT TYPE AND RH: 6200
UNIT TYPE AND RH: 6200
Unit Type and Rh: 600
Unit Type and Rh: 600
Unit Type and Rh: 600
Unit Type and Rh: 600
Unit Type and Rh: 600
Unit Type and Rh: 600
Unit Type and Rh: 600
Unit Type and Rh: 6200

## 2017-11-04 ENCOUNTER — Telehealth: Payer: Self-pay | Admitting: *Deleted

## 2017-11-04 NOTE — Telephone Encounter (Signed)
Mary g. [facility] calls to say pt had a fall this am, has a swollen area on inner side of knee, it is swollen and sore but able to move without great difficulty. Pt has appt Thursday 7/25. She is cautioned that if area becomes very swollen, very painful, red, hot to seek medical care immediately. She is agreeable

## 2017-11-04 NOTE — Telephone Encounter (Signed)
Agreed. Thanks Bonnita Nasuti!

## 2017-11-06 ENCOUNTER — Telehealth: Payer: Self-pay | Admitting: Hematology

## 2017-11-06 ENCOUNTER — Ambulatory Visit: Payer: Medicaid Other | Admitting: Internal Medicine

## 2017-11-06 ENCOUNTER — Telehealth: Payer: Self-pay | Admitting: *Deleted

## 2017-11-06 ENCOUNTER — Other Ambulatory Visit: Payer: Self-pay

## 2017-11-06 VITALS — BP 143/96 | HR 79 | Temp 98.9°F | Wt 274.6 lb

## 2017-11-06 DIAGNOSIS — I780 Hereditary hemorrhagic telangiectasia: Secondary | ICD-10-CM | POA: Diagnosis not present

## 2017-11-06 DIAGNOSIS — I10 Essential (primary) hypertension: Secondary | ICD-10-CM | POA: Diagnosis not present

## 2017-11-06 DIAGNOSIS — F5101 Primary insomnia: Secondary | ICD-10-CM | POA: Diagnosis not present

## 2017-11-06 MED ORDER — ALPRAZOLAM 0.5 MG PO TABS: 0.5000 mg | ORAL_TABLET | Freq: Two times a day (BID) | ORAL | 1 refills | Status: DC | PRN

## 2017-11-06 NOTE — Assessment & Plan Note (Addendum)
Assessment:  Amlodipine held during [place] admission and at discharge given her hypotension due to acute blood loss. BP today 143/96 with pulse 79. While this is just above my usual goal for patients, I think in the setting of her recurrent severe GIB and subsequent hypovolemia it's reasonable to continue holding this. While she does not have bleeding currently, she is at risk for major spontaneous bleeding and want to minimize risk of hypotension.      Plan: Continue holding Amlodipine. She does have HFpEF and could consider resuming an agent if BP uncontrolled moving forward but at this point the risk of hypotension outweighs benefit.

## 2017-11-06 NOTE — Patient Instructions (Signed)
It was great seeing you today! I'm glad you are doing well!  Today we talked about:  #1)HHT/Bleeding: I'm so glad your bleeding has not returned. Please continue your Iron supplements and Tamoxifen daily. You have an appointment with Dr. Burr Medico tomorrow who will check your blood counts.  #2)Anxiety/Insomnia: I've provided a refill of your xanax to be taken only as needed for insomnia or significant anxiety.  #3)Leg swelling: Please be sure to elevate your legs to help mobilize the fluid. If you develop any trouble breathing at night please let us know or go to the ED.  #4)Please continue holding Amlodipine.   I'll see you back in clinic in 75-months, or sooner if needed!!

## 2017-11-06 NOTE — Assessment & Plan Note (Addendum)
HPI: 57 y/o F with HHT and 6 admissions over the past 7 weeks for acute GI bleed and severe anemia requiring numerous transfusions and endoscopies. Most recently admitted at [facility] 7/13 with transfer to [facility] 7/15-7/18 after presenting with large-volume BRBPR shortly after prior hospital discharge. Hb <5 and she was transferred several units and ultimately transferred to [facility] for consideration of DBE. At [facility] she underwent EGD with cauterization of several gastric AVMs. Hb at time of discharge 7.8  Assessment:  Pt declines any further bleeding and specifically denied epistaxis, hematochezia, melena or hematemesis. She denies any abdominal pain and has been compliant with prescribed Tamoxifen & iron. She is not taking Amicar, this appears to have been discontinued at [place].      Plan: In the absence of acute bleeding, I was going to defer CBC until her Hematology appointment tomorrow as this is already ordered for that appointment. Discussed this with patient, she would like her Hb checked today (continues to deny bleeding; pt states she is just nervous given her history). Have ordered CBC today. Patient going to Hematology apt tomorrow @ 10am with Dr. Burr Medico.     **ADDENDUM: Patient with Hb 6.1 despite denying any blood loss or abdominal pain. Discussed with patient early this AM, she had large-volume hematemesis starting yesterday evening. She is currently en route to ED for evaluation. She will require transfusion and endoscopy. Of note, she may benefit from [facility] transfer.

## 2017-11-06 NOTE — Assessment & Plan Note (Signed)
Assessment:  Patient continues to endorse insomnia and has noticed increased anxiety levels since her multiple hospital stays. Anxiety appropriately seems to be focused on possibility of acute large bleed and need for repeated transfusions and endoscopies. She takes Xanax 0.5mg  prn anxiety and insomnia, which I think is appropriate to continue.      Plan: Refill given for Xanax. Pt to continue Zoloft 50mg  as well.

## 2017-11-06 NOTE — Telephone Encounter (Signed)
Called pt re app that was added per 7/24 sch msg - spoke w/ pt re appts.

## 2017-11-06 NOTE — Telephone Encounter (Signed)
Mary with [facility] called requesting VO to resume skilled nursing visits following hospital discharge and PT Eval following fall on 11/04/2017. Verbal auth given. Will route to PCP for approval/denial. Patient has HFU appt today and [facility] Kidney would like to be notified of any new orders or med changes to better assist patient. Stanton Kidney may be reached at [phone]. Hubbard Hartshorn, RN, BSN

## 2017-11-06 NOTE — Progress Notes (Signed)
   CC: follow-up of HHT  HPI:  [name] is a 57 y.o. F with HHT and multiple recent admissions for acute bleeding, numerous transfusions and endoscopies. She's here to follow-up her recent admission at [facility] for HHT/acute bleed.   For details regarding today's visit and the status of their chronic medical issues, please refer to the assessment and plan.  Past Medical History:  Diagnosis Date  . Anxiety   . Arthritis    knnes,back  . GERD (gastroesophageal reflux disease)   . History of swelling of feet   . Hyperlipidemia   . Hypertension   . Major depressive disorder, recurrent episode ([place]) 06/05/2015  . Obesity   . Snores    Review of Systems:   General: Admits to some fatigue (not sleeping well). Denies fevers, chills, weight loss HEENT: Denies epistaxis, hemoptysis, changes in vision, sore throat, dysphagia Cardiac: Denies CP, SOB, palpitations Pulmonary: Denies cough, orthopnea or PND Abd: Denies abdominal pain, melena, hematochezia or hematemesis. Extremities: Admits to some LE swelling. Denies weakness  Physical Exam: General: Alert, in no acute distress. Pleasant and conversant HEENT: Oral mucosa with a few non-bleeding vascularities. No blood in posterior oropharynx. No icterus, injection or ptosis. No hoarseness or dysarthria  Cardiac: RRR, no MGR Pulmonary: CTA BL with normal WOB on RA. Able to speak in complete sentences Abd: Soft, non-tender. +bs Extremities: Warm, perfused. Trace ankle edema, not pitting.    Vitals:   11/06/17 1025  BP: (!) 143/96  Pulse: 79  Temp: 98.9 F (37.2 C)  TempSrc: Oral  SpO2: 99%  Weight: 274 lb 9.6 oz (124.6 kg)   Body mass index is 45.7 kg/m.  Assessment & Plan:   See Encounters Tab for problem based charting.  Patient discussed with Dr. Beryle Beams

## 2017-11-06 NOTE — Telephone Encounter (Signed)
Would be happy to approve VO to resume skilled nursing visits and PT eval. Will update Stanton Kidney if any changes. Thanks!

## 2017-11-07 ENCOUNTER — Emergency Department (HOSPITAL_COMMUNITY)
Admission: EM | Admit: 2017-11-07 | Discharge: 2017-11-08 | Disposition: A | Discharge status: Other Acute Inpatient Hospital | Payer: Medicaid Other | Attending: Emergency Medicine | Admitting: Emergency Medicine

## 2017-11-07 ENCOUNTER — Encounter (HOSPITAL_COMMUNITY): Payer: Self-pay | Admitting: Emergency Medicine

## 2017-11-07 ENCOUNTER — Telehealth: Payer: Self-pay | Admitting: *Deleted

## 2017-11-07 ENCOUNTER — Observation Stay: Admission: AD | Admit: 2017-11-07 | Payer: Medicaid Other | Source: Ambulatory Visit | Admitting: Internal Medicine

## 2017-11-07 ENCOUNTER — Other Ambulatory Visit: Payer: Medicaid Other

## 2017-11-07 ENCOUNTER — Inpatient Hospital Stay: Payer: Medicaid Other | Admitting: Nurse Practitioner

## 2017-11-07 ENCOUNTER — Telehealth: Payer: Self-pay | Admitting: Nurse Practitioner

## 2017-11-07 ENCOUNTER — Other Ambulatory Visit: Payer: Self-pay

## 2017-11-07 DIAGNOSIS — D649 Anemia, unspecified: Secondary | ICD-10-CM

## 2017-11-07 DIAGNOSIS — Z79899 Other long term (current) drug therapy: Secondary | ICD-10-CM | POA: Insufficient documentation

## 2017-11-07 DIAGNOSIS — I10 Essential (primary) hypertension: Secondary | ICD-10-CM | POA: Insufficient documentation

## 2017-11-07 DIAGNOSIS — K922 Gastrointestinal hemorrhage, unspecified: Secondary | ICD-10-CM

## 2017-11-07 DIAGNOSIS — F17210 Nicotine dependence, cigarettes, uncomplicated: Secondary | ICD-10-CM | POA: Insufficient documentation

## 2017-11-07 DIAGNOSIS — K920 Hematemesis: Secondary | ICD-10-CM | POA: Diagnosis present

## 2017-11-07 LAB — COMPREHENSIVE METABOLIC PANEL
ALT: 7 U/L (ref 0–44)
AST: 12 U/L — ABNORMAL LOW (ref 15–41)
Albumin: 2.1 g/dL — ABNORMAL LOW (ref 3.5–5.0)
Alkaline Phosphatase: 51 U/L (ref 38–126)
Anion gap: 6 (ref 5–15)
BILIRUBIN TOTAL: 0.5 mg/dL (ref 0.3–1.2)
BUN: 22 mg/dL — AB (ref 6–20)
CALCIUM: 8 mg/dL — AB (ref 8.9–10.3)
CO2: 22 mmol/L (ref 22–32)
Chloride: 117 mmol/L — ABNORMAL HIGH (ref 98–111)
Creatinine, Ser: 0.79 mg/dL (ref 0.44–1.00)
GFR calc Af Amer: >60 mL/min (ref >60)
Glucose, Bld: 100 mg/dL — ABNORMAL HIGH (ref 70–99)
POTASSIUM: 3.8 mmol/L (ref 3.5–5.1)
Sodium: 145 mmol/L (ref 135–145)
TOTAL PROTEIN: 4.5 g/dL — AB (ref 6.5–8.1)

## 2017-11-07 LAB — CBC
HEMATOCRIT: 20.3 % — AB (ref 34.0–46.6)
HEMATOCRIT: 15.5 % — AB (ref 36.0–46.0)
HEMOGLOBIN: 6.1 g/dL — AB (ref 11.1–15.9)
Hemoglobin: 4.2 g/dL — CL (ref 12.0–15.0)
MCH: 28.4 pg (ref 26.6–33.0)
MCH: 27.5 pg (ref 26.0–34.0)
MCHC: 30 g/dL — ABNORMAL LOW (ref 31.5–35.7)
MCHC: 27.1 g/dL — ABNORMAL LOW (ref 30.0–36.0)
MCV: 94 fL (ref 79–97)
MCV: 101.3 fL — ABNORMAL HIGH (ref 78.0–100.0)
Platelets: 357 10*3/uL (ref 150–450)
Platelets: 339 10*3/uL (ref 150–400)
RBC: 2.15 x10E6/uL — AB (ref 3.77–5.28)
RBC: 1.53 MIL/uL — ABNORMAL LOW (ref 3.87–5.11)
RDW: 16.3 % — ABNORMAL HIGH (ref 12.3–15.4)
RDW: 17 % — AB (ref 11.5–15.5)
WBC: 12.6 10*3/uL — AB (ref 3.4–10.8)
WBC: 12.8 10*3/uL — AB (ref 4.0–10.5)

## 2017-11-07 LAB — I-STAT CHEM 8, ED
BUN: 17 mg/dL (ref 6–20)
CALCIUM ION: 1.13 mmol/L — AB (ref 1.15–1.40)
CHLORIDE: 114 mmol/L — AB (ref 98–111)
CREATININE: 0.8 mg/dL (ref 0.44–1.00)
GLUCOSE: 133 mg/dL — AB (ref 70–99)
HCT: 18 % — ABNORMAL LOW (ref 36.0–46.0)
Hemoglobin: 6.1 g/dL — CL (ref 12.0–15.0)
POTASSIUM: 4 mmol/L (ref 3.5–5.1)
SODIUM: 145 mmol/L (ref 135–145)
TCO2: 20 mmol/L — ABNORMAL LOW (ref 22–32)

## 2017-11-07 LAB — PREPARE RBC (CROSSMATCH)

## 2017-11-07 MED ORDER — TRANEXAMIC ACID 1000 MG/10ML IV SOLN
500.0000 mg | Freq: Once | INTRAVENOUS | Status: DC
  Filled 2017-11-07: qty 10

## 2017-11-07 MED ORDER — OCTREOTIDE LOAD VIA INFUSION
50.0000 ug | Freq: Once | INTRAVENOUS | Status: AC
  Administered 2017-11-07: 50 ug via INTRAVENOUS
  Filled 2017-11-07: qty 25

## 2017-11-07 MED ORDER — SODIUM CHLORIDE 0.9 % IV SOLN: 10.0000 mL/h | Freq: Once | INTRAVENOUS | Status: DC

## 2017-11-07 MED ORDER — SODIUM CHLORIDE 0.9% IV SOLUTION: Freq: Once | INTRAVENOUS | Status: DC

## 2017-11-07 MED ORDER — SODIUM CHLORIDE 0.9 % IV SOLN
80.0000 mg | Freq: Once | INTRAVENOUS | Status: AC
  Administered 2017-11-07: 80 mg via INTRAVENOUS
  Filled 2017-11-07: qty 80

## 2017-11-07 MED ORDER — OCTREOTIDE ACETATE 500 MCG/ML IJ SOLN
50.0000 ug/h | INTRAMUSCULAR | Status: DC
  Administered 2017-11-07 (×2): 50 ug/h via INTRAVENOUS
  Filled 2017-11-07 (×3): qty 1

## 2017-11-07 MED ORDER — SODIUM CHLORIDE 0.9 % IV SOLN
8.0000 mg/h | INTRAVENOUS | Status: DC
  Administered 2017-11-07: 8 mg/h via INTRAVENOUS
  Filled 2017-11-07 (×3): qty 80

## 2017-11-07 MED ORDER — PANTOPRAZOLE SODIUM 40 MG IV SOLR: 40.0000 mg | Freq: Two times a day (BID) | INTRAVENOUS | Status: DC

## 2017-11-07 NOTE — ED Notes (Signed)
Pt requested something for nausea from MD. Awaiting orders.

## 2017-11-07 NOTE — ED Notes (Signed)
Product scanned at 2212 , transfusion began at 2215.

## 2017-11-07 NOTE — Progress Notes (Signed)
Medicine attending: Medical history, presenting problems, physical findings, and medications, reviewed with resident physician Dr Romelle Starcher Molt on the day of the patient visit and I concur with her evaluation and management plan. Unfortunately, not much to offer therapeutically for this lady w HHT. Consider short term use of antifibrinolytic agents I.e. Tranexamic acid for periods of increased bleeding otherwise, standing orders for frequent CBCs & ferritin. Replace blood & IV iron as needed.

## 2017-11-07 NOTE — ED Triage Notes (Addendum)
Patient report hematemesis this morning, reports recent history of GI bleeding. Denies anticoagulation. Denies any other complaints. Patient alert, oriented, and in no apparent distress at this time. Patient reports having 30 blood transfusions in the last three months.

## 2017-11-07 NOTE — ED Provider Notes (Signed)
[facility] EMERGENCY DEPARTMENT Provider Note   CSN: [id number] Arrival date & time: 11/07/17  1210     History   Chief Complaint Chief Complaint  Patient presents with  . Hematemesis    HPI Peggy House is a 57 y.o. female.  HPI  The patient is a 57 year old female, she has a history of arteriovenous malformations and upper GI bleeding which has been recurrent and required approximately 6 admissions to the hospital with multiple transfusions, multiple endoscopies and even transfer to [facility] for further evaluation.  She was asked to come to the emergency department from her family doctor's office, the internal medicine resident service, seen by Dr. Danford Bad.  The patient reports that she is continued to have large amounts of black emesis as well as black stools.  She had a hemoglobin of 6 yesterday.  She complains of some generalized weakness, denies shortness of breath, denies back pain, denies swelling of her legs, she does have chronic abdominal discomfort which is been present for the last 8 weeks.  Symptoms are severe, persistent, nothing seems to make this better.  Past Medical History:  Diagnosis Date  . Anxiety   . Arthritis    knnes,back  . GERD (gastroesophageal reflux disease)   . History of swelling of feet   . Hyperlipidemia   . Hypertension   . Major depressive disorder, recurrent episode ([place]) 06/05/2015  . Obesity   . Snores     Patient Active Problem List   Diagnosis Date Noted  . Upper GI bleed 10/25/2017  . Pulmonary artery hypertension ([place]) 10/19/2017  . Chronic anemia 05/20/2017  . 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 Venous stasis dermatitis of both lower extremities 05/07/2017  . Recurrent epistaxis 11/29/2016  . Iron deficiency anemia 10/29/2016  . Osteoarthritis, multiple sites 06/05/2015  . Insomnia 06/05/2015  . Major depressive disorder, recurrent episode ([place]) 06/05/2015  . Morbid obesity  ([place]) 04/26/2014  . GI bleed 04/25/2014  . HLD (hyperlipidemia) 04/25/2014  . Leg swelling 04/25/2014  . Essential hypertension   . Acute on chronic blood loss anemia 04/07/2014    Past Surgical History:  Procedure Laterality Date  . ABDOMINAL HYSTERECTOMY    . CARPAL TUNNEL RELEASE  05/13/2011   Procedure: CARPAL TUNNEL RELEASE;  Surgeon: Nita Sells, MD;  Location: [facility];  Service: Orthopedics;  Laterality: Left;  . COLONOSCOPY WITH PROPOFOL N/A 04/28/2014   Procedure: COLONOSCOPY WITH PROPOFOL;  Surgeon: Cleotis Nipper, MD;  Location[facility] END[facility]COPY;  Service: Endoscopy;  Laterality: N/A;  . DG TOES*L*  2/10   rt  . DILATION AND CURETTAGE OF UTERUS    . ENTEROSCOPY N/A 10/17/2017   Procedure: ENTEROSCOPY;  Surgeon: Otis Brace, MD;  Location: [facility] ENDOSCOPY;  Service: Gastroenterology;  Laterality: N/A;  . ESOPHAGOGASTRODUODENOSCOPY N/A 04/10/2014   Procedure: ESOPHAGOGASTRODUODENOSCOPY (EGD);  Surgeon: [name], MD;  Location: [facility] ENDOSCOPY;  Service: Endoscopy;  Laterality: N/A;  . ESOPHAGOGASTRODUODENOSCOPY N/A 05/10/2017   Procedure: ESOPHAGOGASTRODUODENOSCOPY (EGD);  Surgeon: Ronald Lobo, MD;  Location: [facility] ENDOSCOPY;  Service: Endoscopy;  Laterality: N/A;  . ESOPHAGOGASTRODUODENOSCOPY N/A 09/22/2017   Procedure: ESOPHAGOGASTRODUODENOSCOPY (EGD);  Surgeon: Clarene Essex, MD;  Location: [facility];  Service: Endoscopy;  Laterality: N/A;  bedside  . ESOPHAGOGASTRODUODENOSCOPY (EGD) WITH PROPOFOL N/A 04/27/2014   Procedure: ESOPHAGOGASTRODUODENOSCOPY (EGD) WITH PROPOFOL;  Surgeon: Cleotis Nipper, MD;  Location: [facility];  Service: Endoscopy;  Laterality: N/A;  possible apc  . ESOPHAGOGASTRODUODENOSCOPY (EGD) WITH PROPOFOL N/A  09/30/2017   Procedure: ESOPHAGOGASTRODUODENOSCOPY (EGD) WITH PROPOFOL;  Surgeon: [name], MD;  Location: [facility];  Service: Gastroenterology;  Laterality: N/A;  . ESOPHAGOGASTRODUODENOSCOPY (EGD) WITH  PROPOFOL N/A 10/01/2017   Procedure: ESOPHAGOGASTRODUODENOSCOPY (EGD) WITH PROPOFOL;  Surgeon: [name], MD;  Location: [place];  Service: Gastroenterology;  Laterality: N/A;  . ESOPHAGOGASTRODUODENOSCOPY (EGD) WITH PROPOFOL N/A 10/08/2017   Procedure: ESOPHAGOGASTRODUODENOSCOPY (EGD) WITH PROPOFOL;  Surgeon: Otis Brace, MD;  Location: [facility] ENDOSCOPY;  Service: Gastroenterology;  Laterality: N/A;  . ESOPHAGOGASTRODUODENOSCOPY (EGD) WITH PROPOFOL N/A 10/17/2017   Procedure: ESOPHAGOGASTRODUODENOSCOPY (EGD) WITH PROPOFOL;  Surgeon: Otis Brace, MD;  Location: [facility] ENDOSCOPY;  Service: Gastroenterology;  Laterality: N/A;  . ESOPHAGOGASTRODUODENOSCOPY (EGD) WITH PROPOFOL N/A 10/19/2017   Procedure: ESOPHAGOGASTRODUODENOSCOPY (EGD) WITH PROPOFOL;  Surgeon: Otis Brace, MD;  Location: [facility] ENDOSCOPY;  Service: Gastroenterology;  Laterality: N/A;  . GIVENS CAPSULE STUDY N/A 10/02/2017   Procedure: GIVENS CAPSULE STUDY;  Surgeon: [name], MD;  Location: [facility];  Service: Gastroenterology;  Laterality: N/A;  . GIVENS CAPSULE STUDY N/A 10/08/2017   Procedure: GIVENS CAPSULE STUDY;  Surgeon: Otis Brace, MD;  Location: [facility];  Service: Gastroenterology;  Laterality: N/A;  endoscopic placement of capsule  . HOT HEMOSTASIS N/A 04/27/2014   Procedure: HOT HEMOSTASIS (ARGON PLASMA COAGULATION/BICAP);  Surgeon: Cleotis Nipper, MD;  Location: [facility] ENDOSCOPY;  Service: Endoscopy;  Laterality: N/A;  . HOT HEMOSTASIS N/A 09/30/2017   Procedure: HOT HEMOSTASIS (ARGON PLASMA COAGULATION/BICAP);  Surgeon: [name], MD;  Location: [facility];  Service: Gastroenterology;  Laterality: N/A;  . HOT HEMOSTASIS N/A 10/01/2017   Procedure: HOT HEMOSTASIS (ARGON PLASMA COAGULATION/BICAP);  Surgeon: [name], MD;  Location: [facility];  Service: Gastroenterology;  Laterality: N/A;  . HOT HEMOSTASIS N/A 10/17/2017   Procedure: HOT HEMOSTASIS (ARGON PLASMA COAGULATION/BICAP);  Surgeon:  Otis Brace, MD;  Location: [facility] ENDOSCOPY;  Service: Gastroenterology;  Laterality: N/A;  . HOT HEMOSTASIS N/A 10/19/2017   Procedure: HOT HEMOSTASIS (ARGON PLASMA COAGULATION/BICAP);  Surgeon: Otis Brace, MD;  Location: [facility] ENDOSCOPY;  Service: Gastroenterology;  Laterality: N/A;  . L shoulder Surgery  2011  . SUBMUCOSAL INJECTION  09/22/2017   Procedure: SUBMUCOSAL INJECTION;  Surgeon: Clarene Essex, MD;  Location: [facility];  Service: Endoscopy;;     OB History   None      Home Medications    Prior to Admission medications   Medication Sig Start Date End Date Taking? Authorizing Provider  aminocaproic acid (AMICAR) 500 MG tablet Take 2 tablets (1,000 mg total) by mouth every 4 (four) hours while awake. 10/27/17  Yes Truitt Merle, MD  atorvastatin (LIPITOR) 80 MG tablet take 1 tablet by mouth once daily 01/11/17  Yes Molt, Bethany, DO  pantoprazole (PROTONIX) 40 MG tablet Take 1 tablet (40 mg total) by mouth 2 (two) times daily. 10/10/17  Yes Katherine Roan, MD  sertraline (ZOLOFT) 50 MG tablet Take 1 tablet (50 mg total) by mouth daily. 09/09/17  Yes Molt, Bethany, DO  tamoxifen (NOLVADEX) 20 MG tablet Take 1 tablet (20 mg total) by mouth daily. 10/15/17  Yes Alla Feeling, NP  albuterol (PROVENTIL HFA;VENTOLIN HFA) 108 (90 Base) MCG/ACT inhaler Inhale 1-2 puffs into the lungs every 6 (six) hours as needed for wheezing or shortness of breath. 04/03/17   Molt, Bethany, DO  ALPRAZolam (XANAX) 0.5 MG tablet Take 1 tablet (0.5 mg total) by mouth 2 (two) times daily as needed for anxiety or sleep. 11/06/17 11/06/18  Molt, Bethany, DO  ferrous gluconate (FERGON) 324 MG  tablet Take 1 tablet (324 mg total) by mouth 3 (three) times daily with meals. Patient taking differently: Take 324 mg by mouth daily with breakfast.  12/25/16 11/06/17  Ardath Sax, MD    Family History Family History  Problem Relation Age of Onset  . Cancer Mother        Ovarian  . Ovarian cancer Mother   .  Diabetes Mother   . Kidney disease Mother   . Bleeding Disorder Mother   . Diabetes type II Sister   . Bleeding Disorder Sister   . Diabetes Sister   . Colon cancer Maternal Grandfather   . Crohn's disease Maternal Grandfather   . Stomach cancer Maternal Grandmother   . Kidney disease Son   . Bleeding Disorder Son   . Dysmenorrhea Neg Hx     Social History Social History   Tobacco Use  . Smoking status: Current Every Day Smoker    Packs/day: 0.50    Years: 30.00    Pack years: 15.00    Types: Cigarettes  . Smokeless tobacco: Former Systems developer    Types: Snuff    Quit date: 80  . Tobacco comment: 1/2 PPD  Substance Use Topics  . Alcohol use: No    Alcohol/week: 0.0 oz  . Drug use: No    Comment: last cocaine-2010     Allergies   Feraheme [ferumoxytol]; Nsaids; Tomato; and Wasp venom   Review of Systems Review of Systems  All other systems reviewed and are negative.    Physical Exam Updated Vital Signs BP 140/68   Pulse 65   Temp 98.4 F (36.9 C) (Oral)   Resp 16   SpO2 100%   Physical Exam  Constitutional: She appears well-developed and well-nourished. She appears distressed.  HENT:  Head: Normocephalic and atraumatic.  Mouth/Throat: Oropharynx is clear and moist. No oropharyngeal exudate.  Very pale mucous membranes  Eyes: Pupils are equal, round, and reactive to light. EOM are normal. Right eye exhibits no discharge. Left eye exhibits no discharge. No scleral icterus.  Very pale conjunctive a  Neck: Normal range of motion. Neck supple. No JVD present. No thyromegaly present.  Cardiovascular: Normal rate, regular rhythm and intact distal pulses. Exam reveals no gallop and no friction rub.  Murmur heard. Soft systolic murmur  Pulmonary/Chest: Effort normal and breath sounds normal. No respiratory distress. She has no wheezes. She has no rales.  Lungs are clear bilaterally  Abdominal: Soft. Bowel sounds are normal. She exhibits no distension and no mass.  There is no tenderness.  No abdominal tenderness  Musculoskeletal: Normal range of motion. She exhibits no edema or tenderness.  Lymphadenopathy:    She has no cervical adenopathy.  Neurological: She is alert. Coordination normal.  Skin: Skin is warm and dry. No rash noted. No erythema.  Psychiatric: She has a normal mood and affect. Her behavior is normal.  Nursing note and vitals reviewed.    ED Treatments / Results  Labs (all labs ordered are listed, but only abnormal results are displayed) Labs Reviewed  COMPREHENSIVE METABOLIC PANEL - Abnormal; Notable for the following components:      Result Value   Chloride 117 (*)    Glucose, Bld 100 (*)    BUN 22 (*)    Calcium 8.0 (*)    Total Protein 4.5 (*)    Albumin 2.1 (*)    AST 12 (*)    All other components within normal limits  CBC - Abnormal; Notable for the  following components:   WBC 12.8 (*)    RBC 1.53 (*)    Hemoglobin 4.2 (*)    HCT 15.5 (*)    MCV 101.3 (*)    MCHC 27.1 (*)    RDW 17.0 (*)    All other components within normal limits  I-STAT CHEM 8, ED  POC OCCULT BLOOD, ED  TYPE AND SCREEN  PREPARE RBC (CROSSMATCH)    EKG None  Radiology No results found.  Procedures .Critical Care Performed by: Noemi Chapel, MD Authorized by: Noemi Chapel, MD   Critical care provider statement:    Critical care time (minutes):  35   Critical care time was exclusive of:  Separately billable procedures and treating other patients and teaching time   Critical care was necessary to treat or prevent imminent or life-threatening deterioration of the following conditions: GI bleeding, severe anemia.   Critical care was time spent personally by me on the following activities:  Blood draw for specimens, development of treatment plan with patient or surrogate, discussions with consultants, evaluation of patient's response to treatment, examination of patient, obtaining history from patient or surrogate, ordering and  performing treatments and interventions, ordering and review of laboratory studies, ordering and review of radiographic studies, pulse oximetry, re-evaluation of patient's condition and review of old charts   (including critical care time)  Medications Ordered in ED Medications  pantoprazole (PROTONIX) 80 mg in sodium chloride 0.9 % 250 mL (0.32 mg/mL) infusion (8 mg/hr Intravenous New Bag/Given 11/07/17 1549)  pantoprazole (PROTONIX) injection 40 mg (has no administration in time range)  octreotide (SANDOSTATIN) 2 mcg/mL load via infusion 50 mcg (has no administration in time range)    And  octreotide (SANDOSTATIN) 500 mcg in sodium chloride 0.9 % 250 mL (2 mcg/mL) infusion (has no administration in time range)  0.9 %  sodium chloride infusion (Manually program via Guardrails IV Fluids) (has no administration in time range)  tranexamic acid (CYKLOKAPRON) injection 500 mg (has no administration in time range)  pantoprazole (PROTONIX) 80 mg in sodium chloride 0.9 % 100 mL IVPB (0 mg Intravenous Stopped 11/07/17 1523)     Initial Impression / Assessment and Plan / ED Course  I have reviewed the triage vital signs and the nursing notes.  Pertinent labs & imaging results that were available during my care of the patient were reviewed by me and considered in my medical decision making (see chart for details).    Though the patient has a chronically ill appearance she has severe anemia today with a hemoglobin just over 4, she has poor liver function, very poor synthetic function, normal renal function.  At this time she will need to be typed and screened, she will need 2 units of blood emergently, she will likely need to be transferred to [facility] for further evaluation.  The patient is critically ill requiring multiple IVs, multiple consultations, multiple interventions including Protonix drip and IV fluids and hemoglobin transfusion.  Review of the medical record and outside records show that  the patient has hereditary hemorrhagic telangiectasias, shows that she had active bleeding in the fundus of the stomach which was treated with epinephrine and 3 clips.  This was performed just prior to October 30, 2017.  Double-balloon endoscopy was not required based on what I can gather from the notes.  Prior EGD in the past showed arteriovenous malformations, multiple in the jejunum, the duodenum, and the stomach.  Interventions:  1.  Blood transfusion 2.  Protonix with drip  3.  IV fluid bolus 4.  Tranexamic acid.  I have discussed the patient's care with the intensive care unit doctor at [facility], Dr. Alisa Graff who has accepted transfer of the patient to the ICU.  They think they are approximately 3 hours from having an open bed.  Additional questions to this physician may be sent through [facility] at the [facility] transfer line at phone number [phone].  Final Clinical Impressions(s) / ED Diagnoses   Final diagnoses:  Severe anemia  Upper GI bleed      Noemi Chapel, MD 11/07/17 1645

## 2017-11-07 NOTE — ED Notes (Signed)
MDs from [facility] and [facility] have spoken, waiting on bed assignment at [facility].

## 2017-11-07 NOTE — Telephone Encounter (Signed)
Call from pt stating she's on her way to the ED now for bleeding/"vomiting blood" -also was told her blood is low. After reading prev note; hgb down to 6.1. Informed pt to proceed to the ED as instructed. Stated ok.

## 2017-11-07 NOTE — ED Notes (Signed)
Pt had another large bm with blood. MD aware.

## 2017-11-07 NOTE — Telephone Encounter (Signed)
Thanks Glenda! Just confirmed she's in the ED now.

## 2017-11-07 NOTE — Telephone Encounter (Signed)
[facility] called this morning to advise they were admitted patient due to low HG.  Regan Rakers Burton<, NP was also notified

## 2017-11-07 NOTE — Progress Notes (Deleted)
[place]  [phone] [phone]  Clinic Follow up Note   Patient Care Team: Einar Gip, DO as PCP - General (Internal Medicine) 11/07/2017  DIAGNOSIS: Iron deficiency anemia secondary to recurrent GI bleeding    INTERVAL HISTORY: Please see below for problem oriented charting.  REVIEW OF SYSTEMS:   Constitutional: Denies fevers, chills or abnormal weight loss Eyes: Denies blurriness of vision Ears, nose, mouth, throat, and face: Denies mucositis or sore throat Respiratory: Denies cough, dyspnea or wheezes Cardiovascular: Denies palpitation, chest discomfort or lower extremity swelling Gastrointestinal:  Denies nausea, heartburn or change in bowel habits Skin: Denies abnormal skin rashes Lymphatics: Denies new lymphadenopathy or easy bruising Neurological:Denies numbness, tingling or new weaknesses Behavioral/Psych: Mood is stable, no new changes  All other systems were reviewed with the patient and are negative.  MEDICAL HISTORY:  Past Medical History:  Diagnosis Date  . Anxiety   . Arthritis    knnes,back  . GERD (gastroesophageal reflux disease)   . History of swelling of feet   . Hyperlipidemia   . Hypertension   . Major depressive disorder, recurrent episode ([place]) 06/05/2015  . Obesity   . Snores     SURGICAL HISTORY: Past Surgical History:  Procedure Laterality Date  . ABDOMINAL HYSTERECTOMY    . CARPAL TUNNEL RELEASE  05/13/2011   Procedure: CARPAL TUNNEL RELEASE;  Surgeon: Nita Sells, MD;  Location: [place];  Service: Orthopedics;  Laterality: Left;  . COLONOSCOPY WITH PROPOFOL N/A 04/28/2014   Procedure: COLONOSCOPY WITH PROPOFOL;  Surgeon: Cleotis Nipper, MD;  Location: [facility] ENDOSCOPY;  Service: Endoscopy;  Laterality: N/A;  . DG TOES*L*  2/10   rt  . DILATION AND CURETTAGE OF UTERUS    . ENTEROSCOPY N/A 10/17/2017   Procedure: ENTEROSCOPY;  Surgeon: Otis Brace, MD;  Location:  [facility] ENDOSCOPY;  Service: Gastroenterology;  Laterality: N/A;  . ESOPHAGOGASTRODUODENOSCOPY N/A 04/10/2014   Procedure: ESOPHAGOGASTRODUODENOSCOPY (EGD);  Surgeon: [name], MD;  Location: [facility] ENDOSCOPY;  Service: Endoscopy;  Laterality: N/A;  . ESOPHAGOGASTRODUODENOSCOPY N/A 05/10/2017   Procedure: ESOPHAGOGASTRODUODENOSCOPY (EGD);  Surgeon: Ronald Lobo, MD;  Location: [facility] ENDOSCOPY;  Service: Endoscopy;  Laterality: N/A;  . ESOPHAGOGASTRODUODENOSCOPY N/A 09/22/2017   Procedure: ESOPHAGOGASTRODUODENOSCOPY (EGD);  Surgeon: Clarene Essex, MD;  Location: [place];  Service: Endoscopy;  Laterality: N/A;  bedside  . ESOPHAGOGASTRODUODENOSCOPY (EGD) WITH PROPOFOL N/A 04/27/2014   Procedure: ESOPHAGOGASTRODUODENOSCOPY (EGD) WITH PROPOFOL;  Surgeon: Cleotis Nipper, MD;  Location: [place];  Service: Endoscopy;  Laterality: N/A;  possible apc  . ESOPHAGOGASTRODUODENOSCOPY (EGD) WITH PROPOFOL N/A 09/30/2017   Procedure: ESOPHAGOGASTRODUODENOSCOPY (EGD) WITH PROPOFOL;  Surgeon: [name], MD;  Location: [place];  Service: Gastroenterology;  Laterality: N/A;  . ESOPHAGOGASTRODUODENOSCOPY (EGD) WITH PROPOFOL N/A 10/01/2017   Procedure: ESOPHAGOGASTRODUODENOSCOPY (EGD) WITH PROPOFOL;  Surgeon: [name], MD;  Location: [place];  Service: Gastroenterology;  Laterality: N/A;  . ESOPHAGOGASTRODUODENOSCOPY (EGD) WITH PROPOFOL N/A 10/08/2017   Procedure: ESOPHAGOGASTRODUODENOSCOPY (EGD) WITH PROPOFOL;  Surgeon: Otis Brace, MD;  Location: [facility] ENDOSCOPY;  Service: Gastroenterology;  Laterality: N/A;  . ESOPHAGOGASTRODUODENOSCOPY (EGD) WITH PROPOFOL N/A 10/17/2017   Procedure: ESOPHAGOGASTRODUODENOSCOPY (EGD) WITH PROPOFOL;  Surgeon: Otis Brace, MD;  Location: [facility] ENDOSCOPY;  Service: Gastroenterology;  Laterality: N/A;  . ESOPHAGOGASTRODUODENOSCOPY (EGD) WITH PROPOFOL N/A 10/19/2017   Procedure: ESOPHAGOGASTRODUODENOSCOPY (EGD) WITH PROPOFOL;  Surgeon: Otis Brace, MD;   Location: [facility] ENDOSCOPY;  Service: Gastroenterology;  Laterality: N/A;  . GIVENS CAPSULE STUDY N/A 10/02/2017   Procedure: GIVENS CAPSULE  STUDY;  Surgeon: [name], MD;  Location: [place];  Service: Gastroenterology;  Laterality: N/A;  . GIVENS CAPSULE STUDY N/A 10/08/2017   Procedure: GIVENS CAPSULE STUDY;  Surgeon: Otis Brace, MD;  Location: [place];  Service: Gastroenterology;  Laterality: N/A;  endoscopic placement of capsule  . HOT HEMOSTASIS N/A 04/27/2014   Procedure: HOT HEMOSTASIS (ARGON PLASMA COAGULATION/BICAP);  Surgeon: Cleotis Nipper, MD;  Location: [facility] ENDOSCOPY;  Service: Endoscopy;  Laterality: N/A;  . HOT HEMOSTASIS N/A 09/30/2017   Procedure: HOT HEMOSTASIS (ARGON PLASMA COAGULATION/BICAP);  Surgeon: [name], MD;  Location: [place];  Service: Gastroenterology;  Laterality: N/A;  . HOT HEMOSTASIS N/A 10/01/2017   Procedure: HOT HEMOSTASIS (ARGON PLASMA COAGULATION/BICAP);  Surgeon: [name], MD;  Location: [place];  Service: Gastroenterology;  Laterality: N/A;  . HOT HEMOSTASIS N/A 10/17/2017   Procedure: HOT HEMOSTASIS (ARGON PLASMA COAGULATION/BICAP);  Surgeon: Otis Brace, MD;  Location: [facility] ENDOSCOPY;  Service: Gastroenterology;  Laterality: N/A;  . HOT HEMOSTASIS N/A 10/19/2017   Procedure: HOT HEMOSTASIS (ARGON PLASMA COAGULATION/BICAP);  Surgeon: Otis Brace, MD;  Location: [facility] ENDOSCOPY;  Service: Gastroenterology;  Laterality: N/A;  . L shoulder Surgery  2011  . SUBMUCOSAL INJECTION  09/22/2017   Procedure: SUBMUCOSAL INJECTION;  Surgeon: Clarene Essex, MD;  Location: [facility] ENDOSCOPY;  Service: Endoscopy;;    I have reviewed the social history and family history with the patient and they are unchanged from previous note.  ALLERGIES:  is allergic to feraheme [ferumoxytol]; nsaids; tomato; and wasp venom.  MEDICATIONS:  Current Outpatient Medications  Medication Sig Dispense Refill  . albuterol (PROVENTIL HFA;VENTOLIN HFA) 108 (90  Base) MCG/ACT inhaler Inhale 1-2 puffs into the lungs every 6 (six) hours as needed for wheezing or shortness of breath. 1 Inhaler 3  . ALPRAZolam (XANAX) 0.5 MG tablet Take 1 tablet (0.5 mg total) by mouth 2 (two) times daily as needed for anxiety or sleep. 60 tablet 1  . aminocaproic acid (AMICAR) 500 MG tablet Take 2 tablets (1,000 mg total) by mouth every 4 (four) hours while awake. 30 tablet 1  . atorvastatin (LIPITOR) 80 MG tablet take 1 tablet by mouth once daily 30 tablet 11  . ferrous gluconate (FERGON) 324 MG tablet Take 1 tablet (324 mg total) by mouth 3 (three) times daily with meals. (Patient taking differently: Take 324 mg by mouth daily with breakfast. ) 90 tablet 0  . pantoprazole (PROTONIX) 40 MG tablet Take 1 tablet (40 mg total) by mouth 2 (two) times daily. 180 tablet 0  . sertraline (ZOLOFT) 50 MG tablet Take 1 tablet (50 mg total) by mouth daily. 30 tablet 1  . tamoxifen (NOLVADEX) 20 MG tablet Take 1 tablet (20 mg total) by mouth daily. 30 tablet 3   No current facility-administered medications for this visit.     PHYSICAL EXAMINATION: ECOG PERFORMANCE STATUS: {CHL ONC ECOG PS:[phone]}  There were no vitals filed for this visit. There were no vitals filed for this visit.  GENERAL:alert, no distress and comfortable SKIN: skin color, texture, turgor are normal, no rashes or significant lesions EYES: normal, Conjunctiva are pink and non-injected, sclera clear OROPHARYNX:no exudate, no erythema and lips, buccal mucosa, and tongue normal  NECK: supple, thyroid normal size, non-tender, without nodularity LYMPH:  no palpable lymphadenopathy in the cervical, axillary or inguinal LUNGS: clear to auscultation and percussion with normal breathing effort HEART: regular rate & rhythm and no murmurs and no lower extremity edema ABDOMEN:abdomen soft, non-tender and normal bowel sounds Musculoskeletal:no cyanosis  of digits and no clubbing  NEURO: alert & oriented x 3 with fluent  speech, no focal motor/sensory deficits  LABORATORY DATA:  I have reviewed the data as listed CBC Latest Ref Rng & Units 11/06/2017 10/26/2017 10/26/2017  WBC 3.4 - 10.8 x10E3/uL 12.6(H) - 15.8(H)  Hemoglobin 11.1 - 15.9 g/dL 6.1(LL) 8.7(L) 6.5(LL)  Hematocrit 34.0 - 46.6 % 20.3(L) 26.4(L) 19.9(L)  Platelets 150 - 450 x10E3/uL 357 - 180     CMP Latest Ref Rng & Units 10/26/2017 10/25/2017 10/21/2017  Glucose 70 - 99 mg/dL 123(H) 116(H) 102(H)  BUN 6 - 20 mg/dL 16 14 9   Creatinine 0.44 - 1.00 mg/dL 0.80 0.71 0.89  Sodium 135 - 145 mmol/L 144 144 140  Potassium 3.5 - 5.1 mmol/L 4.0 3.5 3.5  Chloride 98 - 111 mmol/L 118(H) 117(H) 109  CO2 22 - 32 mmol/L 22 20(L) 23  Calcium 8.9 - 10.3 mg/dL 7.2(L) 7.5(L) 8.3(L)  Total Protein 6.5 - 8.1 g/dL - 3.6(L) -  Total Bilirubin 0.3 - 1.2 mg/dL - 0.3 -  Alkaline Phos 38 - 126 U/L - 48 -  AST 15 - 41 U/L - 16 -  ALT 0 - 44 U/L - 8 -      RADIOGRAPHIC STUDIES: I have personally reviewed the radiological images as listed and agreed with the findings in the report. No results found.   ASSESSMENT & PLAN: Peggy House a56 y.o.African-American female with a history ofGERD, HLD, HTN and obesity.  1. Iron Deficient Anemia 2. Reactive leukocytosis and reactive thrombocytosis 3. Chronic epistaxis, h/o recurrent GI bleeding  4. Smoking cessation 5. Health maintenance, cancer screening    No problem-specific Assessment & Plan notes found for this encounter.   No orders of the defined types were placed in this encounter.  All questions were answered. The patient knows to call the clinic with any problems, questions or concerns. No barriers to learning was detected. I spent {CHL ONC TIME VISIT - XLKGM:[id number]} counseling the patient face to face. The total time spent in the appointment was {CHL ONC TIME VISIT - YQIHK:[id number]} and more than 50% was on counseling and review of test results     Alla Feeling, NP 11/07/17

## 2017-11-07 NOTE — ED Notes (Signed)
Pt had large dark, tarry stool. Pt cleaned and replaced bedding and clothing. Reported to resident.

## 2017-11-07 NOTE — Telephone Encounter (Signed)
Pt's lab draw from yesterday resulted 6.1 Hgb.  Contacted Short Stay  and Sickle Cell to see if pt could be transfused today, but no appts were avail.  Pt was contacted with results-states she felt sick last night. States had blood in stool  and vomit.  Pt instructed to have son bring her to ED and pt agreed.Regenia Skeeter, Darlene Cassady7/26/20199:27 AM

## 2017-11-08 DIAGNOSIS — D72829 Elevated white blood cell count, unspecified: Secondary | ICD-10-CM | POA: Diagnosis not present

## 2017-11-08 DIAGNOSIS — D62 Acute posthemorrhagic anemia: Secondary | ICD-10-CM | POA: Diagnosis not present

## 2017-11-08 DIAGNOSIS — K3182 Dieulafoy lesion (hemorrhagic) of stomach and duodenum: Secondary | ICD-10-CM | POA: Diagnosis not present

## 2017-11-08 DIAGNOSIS — R918 Other nonspecific abnormal finding of lung field: Secondary | ICD-10-CM | POA: Diagnosis not present

## 2017-11-08 DIAGNOSIS — Z8679 Personal history of other diseases of the circulatory system: Secondary | ICD-10-CM | POA: Diagnosis not present

## 2017-11-08 DIAGNOSIS — K317 Polyp of stomach and duodenum: Secondary | ICD-10-CM | POA: Diagnosis not present

## 2017-11-08 DIAGNOSIS — I272 Pulmonary hypertension, unspecified: Secondary | ICD-10-CM | POA: Diagnosis not present

## 2017-11-08 DIAGNOSIS — D500 Iron deficiency anemia secondary to blood loss (chronic): Secondary | ICD-10-CM | POA: Diagnosis not present

## 2017-11-08 DIAGNOSIS — Z7401 Bed confinement status: Secondary | ICD-10-CM | POA: Diagnosis not present

## 2017-11-08 DIAGNOSIS — K922 Gastrointestinal hemorrhage, unspecified: Secondary | ICD-10-CM | POA: Diagnosis not present

## 2017-11-08 DIAGNOSIS — K920 Hematemesis: Secondary | ICD-10-CM | POA: Diagnosis not present

## 2017-11-08 DIAGNOSIS — K921 Melena: Secondary | ICD-10-CM | POA: Diagnosis not present

## 2017-11-09 DIAGNOSIS — D72829 Elevated white blood cell count, unspecified: Secondary | ICD-10-CM | POA: Diagnosis not present

## 2017-11-09 DIAGNOSIS — J811 Chronic pulmonary edema: Secondary | ICD-10-CM | POA: Diagnosis not present

## 2017-11-09 DIAGNOSIS — K317 Polyp of stomach and duodenum: Secondary | ICD-10-CM | POA: Diagnosis not present

## 2017-11-09 DIAGNOSIS — I272 Pulmonary hypertension, unspecified: Secondary | ICD-10-CM | POA: Diagnosis not present

## 2017-11-09 DIAGNOSIS — K3182 Dieulafoy lesion (hemorrhagic) of stomach and duodenum: Secondary | ICD-10-CM | POA: Diagnosis not present

## 2017-11-09 DIAGNOSIS — J9811 Atelectasis: Secondary | ICD-10-CM | POA: Diagnosis not present

## 2017-11-09 DIAGNOSIS — K920 Hematemesis: Secondary | ICD-10-CM | POA: Diagnosis not present

## 2017-11-09 DIAGNOSIS — D62 Acute posthemorrhagic anemia: Secondary | ICD-10-CM | POA: Diagnosis not present

## 2017-11-09 DIAGNOSIS — Z8679 Personal history of other diseases of the circulatory system: Secondary | ICD-10-CM | POA: Diagnosis not present

## 2017-11-09 DIAGNOSIS — K921 Melena: Secondary | ICD-10-CM | POA: Diagnosis not present

## 2017-11-09 DIAGNOSIS — K922 Gastrointestinal hemorrhage, unspecified: Secondary | ICD-10-CM | POA: Diagnosis not present

## 2017-11-09 LAB — TYPE AND SCREEN
ABO/RH(D): A NEG
ANTIBODY SCREEN: NEGATIVE
UNIT DIVISION: 0
UNIT DIVISION: 0
UNIT DIVISION: 0
Unit division: 0
Unit division: 0

## 2017-11-09 LAB — BPAM RBC
BLOOD PRODUCT EXPIRATION DATE: 201908222359
Blood Product Expiration Date: 201908112359
Blood Product Expiration Date: 201908242359
Blood Product Expiration Date: 201908242359
Blood Product Expiration Date: 201908242359
ISSUE DATE / TIME: 201907261418
ISSUE DATE / TIME: 201907261716
ISSUE DATE / TIME: 201907262014
ISSUE DATE / TIME: 201907262147
ISSUE DATE / TIME: 201907270102
UNIT TYPE AND RH: 600
UNIT TYPE AND RH: 600
UNIT TYPE AND RH: 600
Unit Type and Rh: 600
Unit Type and Rh: 600

## 2017-11-10 DIAGNOSIS — K922 Gastrointestinal hemorrhage, unspecified: Secondary | ICD-10-CM | POA: Diagnosis not present

## 2017-11-10 DIAGNOSIS — D62 Acute posthemorrhagic anemia: Secondary | ICD-10-CM | POA: Diagnosis not present

## 2017-11-10 DIAGNOSIS — K921 Melena: Secondary | ICD-10-CM | POA: Diagnosis not present

## 2017-11-11 ENCOUNTER — Telehealth: Payer: Self-pay | Admitting: Internal Medicine

## 2017-11-11 DIAGNOSIS — Z87898 Personal history of other specified conditions: Secondary | ICD-10-CM | POA: Diagnosis not present

## 2017-11-11 DIAGNOSIS — K31819 Angiodysplasia of stomach and duodenum without bleeding: Secondary | ICD-10-CM | POA: Diagnosis not present

## 2017-11-11 DIAGNOSIS — K922 Gastrointestinal hemorrhage, unspecified: Secondary | ICD-10-CM | POA: Diagnosis not present

## 2017-11-11 DIAGNOSIS — K921 Melena: Secondary | ICD-10-CM | POA: Diagnosis not present

## 2017-11-11 DIAGNOSIS — D62 Acute posthemorrhagic anemia: Secondary | ICD-10-CM | POA: Diagnosis not present

## 2017-11-11 DIAGNOSIS — Z8679 Personal history of other diseases of the circulatory system: Secondary | ICD-10-CM | POA: Diagnosis not present

## 2017-11-11 DIAGNOSIS — K920 Hematemesis: Secondary | ICD-10-CM | POA: Diagnosis not present

## 2017-11-11 DIAGNOSIS — R040 Epistaxis: Secondary | ICD-10-CM | POA: Diagnosis not present

## 2017-11-11 NOTE — Telephone Encounter (Signed)
[place] [phone], HAS QUESTION ABOUT THE PATIENT

## 2017-11-11 NOTE — Telephone Encounter (Signed)
rtc to [place], lm for rtc

## 2017-11-12 DIAGNOSIS — K922 Gastrointestinal hemorrhage, unspecified: Secondary | ICD-10-CM | POA: Diagnosis not present

## 2017-11-12 DIAGNOSIS — D62 Acute posthemorrhagic anemia: Secondary | ICD-10-CM | POA: Diagnosis not present

## 2017-11-12 DIAGNOSIS — K921 Melena: Secondary | ICD-10-CM | POA: Diagnosis not present

## 2017-11-13 DIAGNOSIS — D62 Acute posthemorrhagic anemia: Secondary | ICD-10-CM | POA: Diagnosis not present

## 2017-11-13 DIAGNOSIS — Z8679 Personal history of other diseases of the circulatory system: Secondary | ICD-10-CM | POA: Diagnosis not present

## 2017-11-13 DIAGNOSIS — K922 Gastrointestinal hemorrhage, unspecified: Secondary | ICD-10-CM | POA: Diagnosis not present

## 2017-11-13 DIAGNOSIS — K921 Melena: Secondary | ICD-10-CM | POA: Diagnosis not present

## 2017-11-13 DIAGNOSIS — I272 Pulmonary hypertension, unspecified: Secondary | ICD-10-CM | POA: Diagnosis not present

## 2017-11-13 MED ORDER — SERTRALINE HCL 50 MG PO TABS
50.00 | ORAL_TABLET | ORAL | Status: DC
Start: 2017-12-05 — End: 2017-11-13

## 2017-11-13 MED ORDER — GENERIC EXTERNAL MEDICATION
40.00 | Status: DC
Start: 2017-11-13 — End: 2017-11-13

## 2017-11-13 MED ORDER — TAMOXIFEN CITRATE 10 MG PO TABS
20.00 | ORAL_TABLET | ORAL | Status: DC
Start: 2017-11-14 — End: 2017-11-13

## 2017-11-13 MED ORDER — ALBUTEROL SULFATE HFA 108 (90 BASE) MCG/ACT IN AERS
1.00 | INHALATION_SPRAY | RESPIRATORY_TRACT | Status: DC
Start: ? — End: 2017-11-13

## 2017-11-13 MED ORDER — MELATONIN 3 MG PO TABS
3.00 | ORAL_TABLET | ORAL | Status: DC
Start: 2017-12-04 — End: 2017-11-13

## 2017-11-13 MED ORDER — LIDOCAINE HCL 1 % IJ SOLN
0.50 | INTRAMUSCULAR | Status: DC
Start: ? — End: 2017-11-13

## 2017-11-13 MED ORDER — ONDANSETRON HCL 4 MG/2ML IJ SOLN
4.00 | INTRAMUSCULAR | Status: DC
Start: ? — End: 2017-11-13

## 2017-11-13 MED ORDER — LACTATED RINGERS IV SOLN
INTRAVENOUS | Status: DC
Start: ? — End: 2017-11-13

## 2017-11-13 MED ORDER — GENERIC EXTERNAL MEDICATION
50.00 | Status: DC
Start: ? — End: 2017-11-13

## 2017-11-13 MED ORDER — ATORVASTATIN CALCIUM 40 MG PO TABS
80.00 | ORAL_TABLET | ORAL | Status: DC
Start: 2017-12-04 — End: 2017-11-13

## 2017-11-14 DIAGNOSIS — K920 Hematemesis: Secondary | ICD-10-CM | POA: Diagnosis not present

## 2017-11-14 DIAGNOSIS — K922 Gastrointestinal hemorrhage, unspecified: Secondary | ICD-10-CM | POA: Diagnosis not present

## 2017-11-14 DIAGNOSIS — K31819 Angiodysplasia of stomach and duodenum without bleeding: Secondary | ICD-10-CM | POA: Diagnosis not present

## 2017-11-14 DIAGNOSIS — K921 Melena: Secondary | ICD-10-CM | POA: Diagnosis not present

## 2017-11-14 DIAGNOSIS — D62 Acute posthemorrhagic anemia: Secondary | ICD-10-CM | POA: Diagnosis not present

## 2017-11-14 DIAGNOSIS — I272 Pulmonary hypertension, unspecified: Secondary | ICD-10-CM | POA: Diagnosis not present

## 2017-11-14 DIAGNOSIS — K5520 Angiodysplasia of colon without hemorrhage: Secondary | ICD-10-CM | POA: Diagnosis not present

## 2017-11-15 DIAGNOSIS — Z8679 Personal history of other diseases of the circulatory system: Secondary | ICD-10-CM | POA: Diagnosis not present

## 2017-11-15 DIAGNOSIS — K921 Melena: Secondary | ICD-10-CM | POA: Diagnosis not present

## 2017-11-15 DIAGNOSIS — D62 Acute posthemorrhagic anemia: Secondary | ICD-10-CM | POA: Diagnosis not present

## 2017-11-16 DIAGNOSIS — D62 Acute posthemorrhagic anemia: Secondary | ICD-10-CM | POA: Diagnosis not present

## 2017-11-16 DIAGNOSIS — K921 Melena: Secondary | ICD-10-CM | POA: Diagnosis not present

## 2017-11-16 DIAGNOSIS — Z8679 Personal history of other diseases of the circulatory system: Secondary | ICD-10-CM | POA: Diagnosis not present

## 2017-11-17 DIAGNOSIS — I780 Hereditary hemorrhagic telangiectasia: Secondary | ICD-10-CM | POA: Diagnosis not present

## 2017-11-17 DIAGNOSIS — D62 Acute posthemorrhagic anemia: Secondary | ICD-10-CM | POA: Diagnosis not present

## 2017-11-17 DIAGNOSIS — Z8679 Personal history of other diseases of the circulatory system: Secondary | ICD-10-CM | POA: Diagnosis not present

## 2017-11-17 DIAGNOSIS — K922 Gastrointestinal hemorrhage, unspecified: Secondary | ICD-10-CM | POA: Diagnosis not present

## 2017-11-17 DIAGNOSIS — K921 Melena: Secondary | ICD-10-CM | POA: Diagnosis not present

## 2017-11-18 DIAGNOSIS — K921 Melena: Secondary | ICD-10-CM | POA: Diagnosis not present

## 2017-11-18 DIAGNOSIS — K922 Gastrointestinal hemorrhage, unspecified: Secondary | ICD-10-CM | POA: Diagnosis not present

## 2017-11-18 DIAGNOSIS — Z8679 Personal history of other diseases of the circulatory system: Secondary | ICD-10-CM | POA: Diagnosis not present

## 2017-11-18 DIAGNOSIS — D62 Acute posthemorrhagic anemia: Secondary | ICD-10-CM | POA: Diagnosis not present

## 2017-11-19 DIAGNOSIS — K921 Melena: Secondary | ICD-10-CM | POA: Diagnosis not present

## 2017-11-19 DIAGNOSIS — D62 Acute posthemorrhagic anemia: Secondary | ICD-10-CM | POA: Diagnosis not present

## 2017-11-19 DIAGNOSIS — Z8679 Personal history of other diseases of the circulatory system: Secondary | ICD-10-CM | POA: Diagnosis not present

## 2017-11-19 DIAGNOSIS — K5521 Angiodysplasia of colon with hemorrhage: Secondary | ICD-10-CM | POA: Diagnosis not present

## 2017-11-20 DIAGNOSIS — I780 Hereditary hemorrhagic telangiectasia: Secondary | ICD-10-CM | POA: Diagnosis not present

## 2017-11-20 DIAGNOSIS — Z8679 Personal history of other diseases of the circulatory system: Secondary | ICD-10-CM | POA: Diagnosis not present

## 2017-11-20 DIAGNOSIS — K921 Melena: Secondary | ICD-10-CM | POA: Diagnosis not present

## 2017-11-20 DIAGNOSIS — K922 Gastrointestinal hemorrhage, unspecified: Secondary | ICD-10-CM | POA: Diagnosis not present

## 2017-11-20 DIAGNOSIS — D62 Acute posthemorrhagic anemia: Secondary | ICD-10-CM | POA: Diagnosis not present

## 2017-11-20 DIAGNOSIS — D500 Iron deficiency anemia secondary to blood loss (chronic): Secondary | ICD-10-CM | POA: Diagnosis not present

## 2017-11-21 DIAGNOSIS — K922 Gastrointestinal hemorrhage, unspecified: Secondary | ICD-10-CM | POA: Diagnosis not present

## 2017-11-21 DIAGNOSIS — Z8679 Personal history of other diseases of the circulatory system: Secondary | ICD-10-CM | POA: Diagnosis not present

## 2017-11-21 DIAGNOSIS — D62 Acute posthemorrhagic anemia: Secondary | ICD-10-CM | POA: Diagnosis not present

## 2017-11-21 DIAGNOSIS — K921 Melena: Secondary | ICD-10-CM | POA: Diagnosis not present

## 2017-11-22 DIAGNOSIS — D62 Acute posthemorrhagic anemia: Secondary | ICD-10-CM | POA: Diagnosis not present

## 2017-11-23 DIAGNOSIS — K921 Melena: Secondary | ICD-10-CM | POA: Diagnosis not present

## 2017-11-23 DIAGNOSIS — K922 Gastrointestinal hemorrhage, unspecified: Secondary | ICD-10-CM | POA: Diagnosis not present

## 2017-11-23 DIAGNOSIS — D62 Acute posthemorrhagic anemia: Secondary | ICD-10-CM | POA: Diagnosis not present

## 2017-11-23 DIAGNOSIS — K920 Hematemesis: Secondary | ICD-10-CM | POA: Diagnosis not present

## 2017-11-24 DIAGNOSIS — K3182 Dieulafoy lesion (hemorrhagic) of stomach and duodenum: Secondary | ICD-10-CM | POA: Diagnosis not present

## 2017-11-24 DIAGNOSIS — K921 Melena: Secondary | ICD-10-CM | POA: Diagnosis not present

## 2017-11-24 DIAGNOSIS — D62 Acute posthemorrhagic anemia: Secondary | ICD-10-CM | POA: Diagnosis not present

## 2017-11-24 DIAGNOSIS — Z8679 Personal history of other diseases of the circulatory system: Secondary | ICD-10-CM | POA: Diagnosis not present

## 2017-11-24 MED ORDER — PANTOPRAZOLE SODIUM 40 MG PO TBEC
40.00 | DELAYED_RELEASE_TABLET | ORAL | Status: DC
Start: 2017-11-24 — End: 2017-11-24

## 2017-11-24 MED ORDER — ONDANSETRON 4 MG PO TBDP
4.00 | ORAL_TABLET | ORAL | Status: DC
Start: ? — End: 2017-11-24

## 2017-11-25 DIAGNOSIS — D62 Acute posthemorrhagic anemia: Secondary | ICD-10-CM | POA: Diagnosis not present

## 2017-11-25 DIAGNOSIS — K921 Melena: Secondary | ICD-10-CM | POA: Diagnosis not present

## 2017-11-25 DIAGNOSIS — Z8679 Personal history of other diseases of the circulatory system: Secondary | ICD-10-CM | POA: Diagnosis not present

## 2017-11-25 DIAGNOSIS — K3182 Dieulafoy lesion (hemorrhagic) of stomach and duodenum: Secondary | ICD-10-CM | POA: Diagnosis not present

## 2017-11-26 DIAGNOSIS — Z8679 Personal history of other diseases of the circulatory system: Secondary | ICD-10-CM | POA: Diagnosis not present

## 2017-11-26 DIAGNOSIS — D62 Acute posthemorrhagic anemia: Secondary | ICD-10-CM | POA: Diagnosis not present

## 2017-11-27 DIAGNOSIS — D62 Acute posthemorrhagic anemia: Secondary | ICD-10-CM | POA: Diagnosis not present

## 2017-11-28 DIAGNOSIS — D62 Acute posthemorrhagic anemia: Secondary | ICD-10-CM | POA: Diagnosis not present

## 2017-11-29 DIAGNOSIS — K922 Gastrointestinal hemorrhage, unspecified: Secondary | ICD-10-CM | POA: Diagnosis not present

## 2017-11-29 DIAGNOSIS — D62 Acute posthemorrhagic anemia: Secondary | ICD-10-CM | POA: Diagnosis not present

## 2017-11-29 DIAGNOSIS — I10 Essential (primary) hypertension: Secondary | ICD-10-CM | POA: Diagnosis not present

## 2017-11-29 DIAGNOSIS — M545 Low back pain: Secondary | ICD-10-CM | POA: Diagnosis not present

## 2017-11-30 DIAGNOSIS — M545 Low back pain: Secondary | ICD-10-CM | POA: Diagnosis not present

## 2017-11-30 DIAGNOSIS — D62 Acute posthemorrhagic anemia: Secondary | ICD-10-CM | POA: Diagnosis not present

## 2017-11-30 DIAGNOSIS — I10 Essential (primary) hypertension: Secondary | ICD-10-CM | POA: Diagnosis not present

## 2017-11-30 DIAGNOSIS — K922 Gastrointestinal hemorrhage, unspecified: Secondary | ICD-10-CM | POA: Diagnosis not present

## 2017-11-30 MED ORDER — GENERIC EXTERNAL MEDICATION
40.00 | Status: DC
Start: 2017-12-04 — End: 2017-11-30

## 2017-12-01 DIAGNOSIS — I10 Essential (primary) hypertension: Secondary | ICD-10-CM | POA: Diagnosis not present

## 2017-12-01 DIAGNOSIS — K922 Gastrointestinal hemorrhage, unspecified: Secondary | ICD-10-CM | POA: Diagnosis not present

## 2017-12-01 DIAGNOSIS — M545 Low back pain: Secondary | ICD-10-CM | POA: Diagnosis not present

## 2017-12-01 DIAGNOSIS — D62 Acute posthemorrhagic anemia: Secondary | ICD-10-CM | POA: Diagnosis not present

## 2017-12-02 DIAGNOSIS — D62 Acute posthemorrhagic anemia: Secondary | ICD-10-CM | POA: Diagnosis not present

## 2017-12-02 DIAGNOSIS — M545 Low back pain: Secondary | ICD-10-CM | POA: Diagnosis not present

## 2017-12-02 DIAGNOSIS — I10 Essential (primary) hypertension: Secondary | ICD-10-CM | POA: Diagnosis not present

## 2017-12-02 DIAGNOSIS — K922 Gastrointestinal hemorrhage, unspecified: Secondary | ICD-10-CM | POA: Diagnosis not present

## 2017-12-03 DIAGNOSIS — K922 Gastrointestinal hemorrhage, unspecified: Secondary | ICD-10-CM | POA: Diagnosis not present

## 2017-12-03 DIAGNOSIS — D62 Acute posthemorrhagic anemia: Secondary | ICD-10-CM | POA: Diagnosis not present

## 2017-12-03 DIAGNOSIS — I10 Essential (primary) hypertension: Secondary | ICD-10-CM | POA: Diagnosis not present

## 2017-12-03 DIAGNOSIS — M545 Low back pain: Secondary | ICD-10-CM | POA: Diagnosis not present

## 2017-12-04 DIAGNOSIS — I10 Essential (primary) hypertension: Secondary | ICD-10-CM | POA: Diagnosis not present

## 2017-12-04 DIAGNOSIS — M545 Low back pain: Secondary | ICD-10-CM | POA: Diagnosis not present

## 2017-12-04 DIAGNOSIS — K922 Gastrointestinal hemorrhage, unspecified: Secondary | ICD-10-CM | POA: Diagnosis not present

## 2017-12-04 DIAGNOSIS — D62 Acute posthemorrhagic anemia: Secondary | ICD-10-CM | POA: Diagnosis not present

## 2017-12-05 ENCOUNTER — Encounter: Payer: Self-pay | Admitting: Internal Medicine

## 2017-12-05 ENCOUNTER — Ambulatory Visit: Payer: Medicaid Other

## 2017-12-06 ENCOUNTER — Other Ambulatory Visit: Payer: Self-pay

## 2017-12-06 ENCOUNTER — Emergency Department (HOSPITAL_COMMUNITY)
Admission: EM | Admit: 2017-12-06 | Discharge: 2017-12-07 | Disposition: A | Discharge status: Home / Self Care | Payer: Medicaid Other | Attending: Emergency Medicine | Admitting: Emergency Medicine

## 2017-12-06 DIAGNOSIS — F17210 Nicotine dependence, cigarettes, uncomplicated: Secondary | ICD-10-CM | POA: Insufficient documentation

## 2017-12-06 DIAGNOSIS — Z79899 Other long term (current) drug therapy: Secondary | ICD-10-CM | POA: Diagnosis not present

## 2017-12-06 DIAGNOSIS — I780 Hereditary hemorrhagic telangiectasia: Secondary | ICD-10-CM | POA: Diagnosis not present

## 2017-12-06 DIAGNOSIS — D500 Iron deficiency anemia secondary to blood loss (chronic): Secondary | ICD-10-CM | POA: Diagnosis not present

## 2017-12-06 DIAGNOSIS — I10 Essential (primary) hypertension: Secondary | ICD-10-CM | POA: Diagnosis not present

## 2017-12-06 DIAGNOSIS — K625 Hemorrhage of anus and rectum: Secondary | ICD-10-CM | POA: Diagnosis present

## 2017-12-06 DIAGNOSIS — K922 Gastrointestinal hemorrhage, unspecified: Secondary | ICD-10-CM | POA: Insufficient documentation

## 2017-12-06 LAB — CBC
HCT: 23.2 % — ABNORMAL LOW (ref 36.0–46.0)
Hemoglobin: 6.7 g/dL — CL (ref 12.0–15.0)
MCH: 29 pg (ref 26.0–34.0)
MCHC: 28.9 g/dL — ABNORMAL LOW (ref 30.0–36.0)
MCV: 100.4 fL — ABNORMAL HIGH (ref 78.0–100.0)
Platelets: 445 10*3/uL — ABNORMAL HIGH (ref 150–400)
RBC: 2.31 MIL/uL — ABNORMAL LOW (ref 3.87–5.11)
RDW: 16 % — ABNORMAL HIGH (ref 11.5–15.5)
WBC: 15 10*3/uL — ABNORMAL HIGH (ref 4.0–10.5)

## 2017-12-06 LAB — COMPREHENSIVE METABOLIC PANEL
ALT: 10 U/L (ref 0–44)
AST: 16 U/L (ref 15–41)
Albumin: 2.3 g/dL — ABNORMAL LOW (ref 3.5–5.0)
Alkaline Phosphatase: 56 U/L (ref 38–126)
Anion gap: 9 (ref 5–15)
BUN: 19 mg/dL (ref 6–20)
CO2: 20 mmol/L — ABNORMAL LOW (ref 22–32)
Calcium: 8.2 mg/dL — ABNORMAL LOW (ref 8.9–10.3)
Chloride: 114 mmol/L — ABNORMAL HIGH (ref 98–111)
Creatinine, Ser: 0.8 mg/dL (ref 0.44–1.00)
GFR calc Af Amer: >60 mL/min (ref >60)
GFR calc non Af Amer: >60 mL/min (ref >60)
Glucose, Bld: 107 mg/dL — ABNORMAL HIGH (ref 70–99)
Potassium: 3.7 mmol/L (ref 3.5–5.1)
Sodium: 143 mmol/L (ref 135–145)
Total Bilirubin: 0.5 mg/dL (ref 0.3–1.2)
Total Protein: 4.9 g/dL — ABNORMAL LOW (ref 6.5–8.1)

## 2017-12-06 LAB — PREPARE RBC (CROSSMATCH)

## 2017-12-06 MED ORDER — SODIUM CHLORIDE 0.9 % IV SOLN
80.0000 mg | Freq: Once | INTRAVENOUS | Status: AC
  Administered 2017-12-06: 80 mg via INTRAVENOUS
  Filled 2017-12-06: qty 80

## 2017-12-06 MED ORDER — SODIUM CHLORIDE 0.9 % IV SOLN
8.0000 mg/h | INTRAVENOUS | Status: DC
  Administered 2017-12-06: 8 mg/h via INTRAVENOUS
  Filled 2017-12-06 (×2): qty 80

## 2017-12-06 MED ORDER — OXYMETAZOLINE HCL 0.05 % NA SOLN
2.00 | NASAL | Status: DC
Start: ? — End: 2017-12-06

## 2017-12-06 MED ORDER — SODIUM CHLORIDE 0.9 % IV SOLN: 10.0000 mL/h | Freq: Once | INTRAVENOUS | Status: DC

## 2017-12-06 NOTE — ED Notes (Signed)
Dr. Wilson Singer aware of critical Hgb of 6.7.

## 2017-12-06 NOTE — ED Triage Notes (Signed)
Patient reports vomiting blood and and seeing blood in her BMs. This has been intermittent problem x2 months, worsening today. Was seen at [facility] and had the problem "fixed".

## 2017-12-06 NOTE — ED Provider Notes (Signed)
[facility] EMERGENCY DEPARTMENT Provider Note   CSN: [id number] Arrival date & time: 12/06/17  2117     History   Chief Complaint Chief Complaint  Patient presents with  . GI Bleeding    HPI Peggy House is a 57 y.o. female.  HPI   57 year old female with GI bleeding.  She has extensive past medical history of the same.  In short, she has a history of hereditary hemorrhagic telangiectasia.  She has been admitted multiple times for GI bleeding both at [facility] and most recently at [facility] for ~4 weeks and discharged on 8/22.  Per discharge summary, her hemoglobin was 8 at that time.  Earlier today she began having bright red blood per rectum.  She describes bright red blood w/o significant stool. 3-4 episodes. She then had hematemesis which she describes as bright red as well. She also developed a nose bleed.   She denies acute pain. Denies feeling dizzy, lightheaded, SOB, etc.   Past Medical History:  Diagnosis Date  . Anxiety   . Arthritis    knnes,back  . GERD (gastroesophageal reflux disease)   . History of swelling of feet   . Hyperlipidemia   . Hypertension   . Major depressive disorder, recurrent episode ([place]) 06/05/2015  . Obesity   . Snores     Patient Active Problem List   Diagnosis Date Noted  . Upper GI bleed 10/25/2017  . 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 Chronic anemia 05/20/2017  . 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 Venous stasis dermatitis of both lower extremities 05/07/2017  . Recurrent epistaxis 11/29/2016  . Iron deficiency anemia 10/29/2016  . Osteoarthritis, multiple sites 06/05/2015  . Insomnia 06/05/2015  . Major depressive disorder, recurrent episode ([place]) 06/05/2015  . Morbid obesity ([place]) 04/26/2014  . GI bleed 04/25/2014  . HLD (hyperlipidemia) 04/25/2014  . Leg swelling 04/25/2014  . Essential hypertension   . Acute on chronic blood loss anemia 04/07/2014    Past Surgical  History:  Procedure Laterality Date  . ABDOMINAL HYSTERECTOMY    . CARPAL TUNNEL RELEASE  05/13/2011   Procedure: CARPAL TUNNEL RELEASE;  Surgeon: Nita Sells, MD;  Location: [place];  Service: Orthopedics;  Laterality: Left;  . COLONOSCOPY WITH PROPOFOL N/A 04/28/2014   Procedure: COLONOSCOPY WITH PROPOFOL;  Surgeon: Cleotis Nipper, MD;  Location: [facility] ENDOSCOPY;  Service: Endoscopy;  Laterality: N/A;  . DG TOES*L*  2/10   rt  . DILATION AND CURETTAGE OF UTERUS    . ENTEROSCOPY N/A 10/17/2017   Procedure: ENTEROSCOPY;  Surgeon: Otis Brace, MD;  Location: [place] ENDOSCOPY;  Service: Gastroenterology;  Laterality: N/A;  . ESOPHAGOGASTRODUODENOSCOPY N/A 04/10/2014   Procedure: ESOPHAGOGASTRODUODENOSCOPY (EGD);  Surgeon: [name], MD;  Location: [facility] ENDOSCOPY;  Service: Endoscopy;  Laterality: N/A;  . ESOPHAGOGASTRODUODENOSCOPY N/A 05/10/2017   Procedure: ESOPHAGOGASTRODUODENOSCOPY (EGD);  Surgeon: Ronald Lobo, MD;  Location: [facility] ENDOSCOPY;  Service: Endoscopy;  Laterality: N/A;  . ESOPHAGOGASTRODUODENOSCOPY N/A 09/22/2017   Procedure: ESOPHAGOGASTRODUODENOSCOPY (EGD);  Surgeon: Clarene Essex, MD;  Location: [place];  Service: Endoscopy;  Laterality: N/A;  bedside  . ESOPHAGOGASTRODUODENOSCOPY (EGD) WITH PROPOFOL N/A 04/27/2014   Procedure: ESOPHAGOGASTRODUODENOSCOPY (EGD) WITH PROPOFOL;  Surgeon: Cleotis Nipper, MD;  Location: [place];  Service: Endoscopy;  Laterality: N/A;  possible apc  . ESOPHAGOGASTRODUODENOSCOPY (EGD) WITH PROPOFOL N/A 09/30/2017   Procedure: ESOPHAGOGASTRODUODENOSCOPY (EGD) WITH PROPOFOL;  Surgeon: [name], MD;  Location: [place];  Service: Gastroenterology;  Laterality:  N/A;  . ESOPHAGOGASTRODUODENOSCOPY (EGD) WITH PROPOFOL N/A 10/01/2017   Procedure: ESOPHAGOGASTRODUODENOSCOPY (EGD) WITH PROPOFOL;  Surgeon: [name], MD;  Location: [place];  Service: Gastroenterology;  Laterality: N/A;  .  ESOPHAGOGASTRODUODENOSCOPY (EGD) WITH PROPOFOL N/A 10/08/2017   Procedure: ESOPHAGOGASTRODUODENOSCOPY (EGD) WITH PROPOFOL;  Surgeon: Otis Brace, MD;  Location: [facility] ENDOSCOPY;  Service: Gastroenterology;  Laterality: N/A;  . ESOPHAGOGASTRODUODENOSCOPY (EGD) WITH PROPOFOL N/A 10/17/2017   Procedure: ESOPHAGOGASTRODUODENOSCOPY (EGD) WITH PROPOFOL;  Surgeon: Otis Brace, MD;  Location: [facility] ENDOSCOPY;  Service: Gastroenterology;  Laterality: N/A;  . ESOPHAGOGASTRODUODENOSCOPY (EGD) WITH PROPOFOL N/A 10/19/2017   Procedure: ESOPHAGOGASTRODUODENOSCOPY (EGD) WITH PROPOFOL;  Surgeon: Otis Brace, MD;  Location: [facility] ENDOSCOPY;  Service: Gastroenterology;  Laterality: N/A;  . GIVENS CAPSULE STUDY N/A 10/02/2017   Procedure: GIVENS CAPSULE STUDY;  Surgeon: [name], MD;  Location: [place];  Service: Gastroenterology;  Laterality: N/A;  . GIVENS CAPSULE STUDY N/A 10/08/2017   Procedure: GIVENS CAPSULE STUDY;  Surgeon: Otis Brace, MD;  Location: [place];  Service: Gastroenterology;  Laterality: N/A;  endoscopic placement of capsule  . HOT HEMOSTASIS N/A 04/27/2014   Procedure: HOT HEMOSTASIS (ARGON PLASMA COAGULATION/BICAP);  Surgeon: Cleotis Nipper, MD;  Location: [facility] ENDOSCOPY;  Service: Endoscopy;  Laterality: N/A;  . HOT HEMOSTASIS N/A 09/30/2017   Procedure: HOT HEMOSTASIS (ARGON PLASMA COAGULATION/BICAP);  Surgeon: [name], MD;  Location: [place];  Service: Gastroenterology;  Laterality: N/A;  . HOT HEMOSTASIS N/A 10/01/2017   Procedure: HOT HEMOSTASIS (ARGON PLASMA COAGULATION/BICAP);  Surgeon: [name], MD;  Location: [place];  Service: Gastroenterology;  Laterality: N/A;  . HOT HEMOSTASIS N/A 10/17/2017   Procedure: HOT HEMOSTASIS (ARGON PLASMA COAGULATION/BICAP);  Surgeon: Otis Brace, MD;  Location: [facility] ENDOSCOPY;  Service: Gastroenterology;  Laterality: N/A;  . HOT HEMOSTASIS N/A 10/19/2017   Procedure: HOT HEMOSTASIS (ARGON PLASMA COAGULATION/BICAP);   Surgeon: Otis Brace, MD;  Location: [facility] ENDOSCOPY;  Service: Gastroenterology;  Laterality: N/A;  . L shoulder Surgery  2011  . SUBMUCOSAL INJECTION  09/22/2017   Procedure: SUBMUCOSAL INJECTION;  Surgeon: Clarene Essex, MD;  Location: [place];  Service: Endoscopy;;     OB History   None      Home Medications    Prior to Admission medications   Medication Sig Start Date End Date Taking? Authorizing Provider  albuterol (PROVENTIL HFA;VENTOLIN HFA) 108 (90 Base) MCG/ACT inhaler Inhale 1-2 puffs into the lungs every 6 (six) hours as needed for wheezing or shortness of breath. 04/03/17   Molt, Bethany, DO  ALPRAZolam (XANAX) 0.5 MG tablet Take 1 tablet (0.5 mg total) by mouth 2 (two) times daily as needed for anxiety or sleep. 11/06/17 11/06/18  Molt, Bethany, DO  aminocaproic acid (AMICAR) 500 MG tablet Take 2 tablets (1,000 mg total) by mouth every 4 (four) hours while awake. 10/27/17   Truitt Merle, MD  amLODipine (NORVASC) 10 MG tablet Take 10 mg by mouth daily.    [provider]  atorvastatin (LIPITOR) 80 MG tablet take 1 tablet by mouth once daily 01/11/17   Molt, Bethany, DO  ferrous gluconate (FERGON) 324 MG tablet Take 1 tablet (324 mg total) by mouth 3 (three) times daily with meals. 12/25/16 11/07/17  Ardath Sax, MD  pantoprazole (PROTONIX) 40 MG tablet Take 1 tablet (40 mg total) by mouth 2 (two) times daily. 10/10/17   Katherine Roan, MD  sertraline (ZOLOFT) 50 MG tablet Take 1 tablet (50 mg total) by mouth daily. 09/09/17   Molt, Bethany, DO  tamoxifen (NOLVADEX) 20  MG tablet Take 1 tablet (20 mg total) by mouth daily. 10/15/17   Alla Feeling, NP    Family History Family History  Problem Relation Age of Onset  . Cancer Mother        Ovarian  . Ovarian cancer Mother   . Diabetes Mother   . Kidney disease Mother   . Bleeding Disorder Mother   . Diabetes type II Sister   . Bleeding Disorder Sister   . Diabetes Sister   . Colon cancer Maternal  Grandfather   . Crohn's disease Maternal Grandfather   . Stomach cancer Maternal Grandmother   . Kidney disease Son   . Bleeding Disorder Son   . Dysmenorrhea Neg Hx     Social History Social History   Tobacco Use  . Smoking status: Current Every Day Smoker    Packs/day: 0.50    Years: 30.00    Pack years: 15.00    Types: Cigarettes  . Smokeless tobacco: Former Systems developer    Types: Snuff    Quit date: 62  . Tobacco comment: 1/2 PPD  Substance Use Topics  . Alcohol use: No    Alcohol/week: 0.0 standard drinks  . Drug use: No    Comment: last cocaine-2010     Allergies   Feraheme [ferumoxytol]; Nsaids; Tomato; and Wasp venom   Review of Systems Review of Systems  All systems reviewed and negative, other than as noted in HPI.  Physical Exam Updated Vital Signs BP 121/79   Pulse 78   Temp 99 F (37.2 C) (Oral)   Resp (!) 24   Wt 124 kg   SpO2 100%   BMI 45.49 kg/m   Physical Exam  Constitutional: She appears well-developed and well-nourished. No distress.  HENT:  Head: Normocephalic and atraumatic.  Eyes: Conjunctivae are normal. Right eye exhibits no discharge. Left eye exhibits no discharge.  Neck: Neck supple.  Cardiovascular: Normal rate, regular rhythm and normal heart sounds. Exam reveals no gallop and no friction rub.  No murmur heard. Pulmonary/Chest: Effort normal and breath sounds normal. No respiratory distress.  Abdominal: Soft. She exhibits no distension. There is no tenderness.  Musculoskeletal: She exhibits no edema or tenderness.  Neurological: She is alert.  Skin: Skin is warm and dry.  Psychiatric: She has a normal mood and affect. Her behavior is normal. Thought content normal.  Nursing note and vitals reviewed.    ED Treatments / Results  Labs (all labs ordered are listed, but only abnormal results are displayed) Labs Reviewed  COMPREHENSIVE METABOLIC PANEL - Abnormal; Notable for the following components:      Result Value    Chloride 114 (*)    CO2 20 (*)    Glucose, Bld 107 (*)    Calcium 8.2 (*)    Total Protein 4.9 (*)    Albumin 2.3 (*)    All other components within normal limits  CBC - Abnormal; Notable for the following components:   WBC 15.0 (*)    RBC 2.31 (*)    Hemoglobin 6.7 (*)    HCT 23.2 (*)    MCV 100.4 (*)    MCHC 28.9 (*)    RDW 16.0 (*)    Platelets 445 (*)    All other components within normal limits  POC OCCULT BLOOD, ED  TYPE AND SCREEN  PREPARE RBC (CROSSMATCH)    EKG None  Radiology No results found.  Procedures Procedures (including critical care time)  CRITICAL CARE Performed by: Virgel Manifold  Total critical care time: 40 minutes Critical care time was exclusive of separately billable procedures and treating other patients. Critical care was necessary to treat or prevent imminent or life-threatening deterioration. Critical care was time spent personally by me on the following activities: development of treatment plan with patient and/or surrogate as well as nursing, discussions with consultants, evaluation of patient's response to treatment, examination of patient, obtaining history from patient or surrogate, ordering and performing treatments and interventions, ordering and review of laboratory studies, ordering and review of radiographic studies, pulse oximetry and re-evaluation of patient's condition.   Medications Ordered in ED Medications  pantoprazole (PROTONIX) 80 mg in sodium chloride 0.9 % 100 mL IVPB (has no administration in time range)  pantoprazole (PROTONIX) 80 mg in sodium chloride 0.9 % 250 mL (0.32 mg/mL) infusion (has no administration in time range)  0.9 %  sodium chloride infusion (has no administration in time range)     Initial Impression / Assessment and Plan / ED Course  I have reviewed the triage vital signs and the nursing notes.  Pertinent labs & imaging results that were available during my care of the patient were reviewed by me and  considered in my medical decision making (see chart for details).     57 year old female with GI bleeding in the setting of hereditary hemorrhagic telangiectasia.  She is stable from a hemodynamic standpoint at this time.  Hemoglobin at the time of discharge from outside hospital was 8.  6.7 currently.  From her description, it sounds like she will examine is significant amount of bleeding.  We will start to transfuse.  Small amount of blood noted left nose, but no active bleeding appreciated and none noted in posterior pharynx. She was at [facility] for almost a month and just discharged two days ago. Will discuss with their transfer center.   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

## 2017-12-06 NOTE — ED Notes (Signed)
[facility] called for transport to [facility] 

## 2017-12-06 NOTE — ED Notes (Signed)
Paged [facility] transfer center to [phone]

## 2017-12-07 DIAGNOSIS — K922 Gastrointestinal hemorrhage, unspecified: Secondary | ICD-10-CM | POA: Diagnosis not present

## 2017-12-07 DIAGNOSIS — E785 Hyperlipidemia, unspecified: Secondary | ICD-10-CM | POA: Diagnosis not present

## 2017-12-07 DIAGNOSIS — K3182 Dieulafoy lesion (hemorrhagic) of stomach and duodenum: Secondary | ICD-10-CM | POA: Diagnosis not present

## 2017-12-07 DIAGNOSIS — K3189 Other diseases of stomach and duodenum: Secondary | ICD-10-CM | POA: Diagnosis not present

## 2017-12-07 DIAGNOSIS — D62 Acute posthemorrhagic anemia: Secondary | ICD-10-CM | POA: Diagnosis not present

## 2017-12-07 DIAGNOSIS — K31819 Angiodysplasia of stomach and duodenum without bleeding: Secondary | ICD-10-CM | POA: Diagnosis not present

## 2017-12-07 DIAGNOSIS — T182XXA Foreign body in stomach, initial encounter: Secondary | ICD-10-CM | POA: Diagnosis not present

## 2017-12-07 DIAGNOSIS — I780 Hereditary hemorrhagic telangiectasia: Secondary | ICD-10-CM | POA: Diagnosis not present

## 2017-12-07 LAB — TYPE AND SCREEN
ABO/RH(D): A NEG
Antibody Screen: NEGATIVE
Unit division: 0
Unit division: 0

## 2017-12-07 LAB — BPAM RBC
BLOOD PRODUCT EXPIRATION DATE: 201908292359
Blood Product Expiration Date: 201908292359
ISSUE DATE / TIME: 201908242316
Unit Type and Rh: 600
Unit Type and Rh: 600

## 2017-12-08 ENCOUNTER — Ambulatory Visit: Payer: Medicaid Other

## 2017-12-08 DIAGNOSIS — T182XXA Foreign body in stomach, initial encounter: Secondary | ICD-10-CM | POA: Diagnosis not present

## 2017-12-08 DIAGNOSIS — K922 Gastrointestinal hemorrhage, unspecified: Secondary | ICD-10-CM | POA: Diagnosis not present

## 2017-12-08 DIAGNOSIS — K3189 Other diseases of stomach and duodenum: Secondary | ICD-10-CM | POA: Diagnosis not present

## 2017-12-08 DIAGNOSIS — D62 Acute posthemorrhagic anemia: Secondary | ICD-10-CM | POA: Diagnosis not present

## 2017-12-08 DIAGNOSIS — K3182 Dieulafoy lesion (hemorrhagic) of stomach and duodenum: Secondary | ICD-10-CM | POA: Diagnosis not present

## 2017-12-08 DIAGNOSIS — I10 Essential (primary) hypertension: Secondary | ICD-10-CM | POA: Diagnosis not present

## 2017-12-08 DIAGNOSIS — K31819 Angiodysplasia of stomach and duodenum without bleeding: Secondary | ICD-10-CM | POA: Diagnosis not present

## 2017-12-08 DIAGNOSIS — I780 Hereditary hemorrhagic telangiectasia: Secondary | ICD-10-CM | POA: Diagnosis not present

## 2017-12-09 DIAGNOSIS — K922 Gastrointestinal hemorrhage, unspecified: Secondary | ICD-10-CM | POA: Diagnosis not present

## 2017-12-09 DIAGNOSIS — K3189 Other diseases of stomach and duodenum: Secondary | ICD-10-CM | POA: Diagnosis not present

## 2017-12-09 DIAGNOSIS — T182XXA Foreign body in stomach, initial encounter: Secondary | ICD-10-CM | POA: Diagnosis not present

## 2017-12-09 DIAGNOSIS — K3182 Dieulafoy lesion (hemorrhagic) of stomach and duodenum: Secondary | ICD-10-CM | POA: Diagnosis not present

## 2017-12-09 DIAGNOSIS — I10 Essential (primary) hypertension: Secondary | ICD-10-CM | POA: Diagnosis not present

## 2017-12-09 DIAGNOSIS — I780 Hereditary hemorrhagic telangiectasia: Secondary | ICD-10-CM | POA: Diagnosis not present

## 2017-12-09 DIAGNOSIS — D62 Acute posthemorrhagic anemia: Secondary | ICD-10-CM | POA: Diagnosis not present

## 2017-12-09 DIAGNOSIS — K31819 Angiodysplasia of stomach and duodenum without bleeding: Secondary | ICD-10-CM | POA: Diagnosis not present

## 2017-12-10 DIAGNOSIS — K250 Acute gastric ulcer with hemorrhage: Secondary | ICD-10-CM | POA: Diagnosis not present

## 2017-12-10 DIAGNOSIS — D62 Acute posthemorrhagic anemia: Secondary | ICD-10-CM | POA: Diagnosis not present

## 2017-12-10 DIAGNOSIS — K922 Gastrointestinal hemorrhage, unspecified: Secondary | ICD-10-CM | POA: Diagnosis not present

## 2017-12-10 DIAGNOSIS — I780 Hereditary hemorrhagic telangiectasia: Secondary | ICD-10-CM | POA: Diagnosis not present

## 2017-12-10 DIAGNOSIS — I10 Essential (primary) hypertension: Secondary | ICD-10-CM | POA: Diagnosis not present

## 2017-12-11 DIAGNOSIS — K922 Gastrointestinal hemorrhage, unspecified: Secondary | ICD-10-CM | POA: Diagnosis not present

## 2017-12-11 DIAGNOSIS — D62 Acute posthemorrhagic anemia: Secondary | ICD-10-CM | POA: Diagnosis not present

## 2017-12-11 DIAGNOSIS — I10 Essential (primary) hypertension: Secondary | ICD-10-CM | POA: Diagnosis not present

## 2017-12-11 DIAGNOSIS — I780 Hereditary hemorrhagic telangiectasia: Secondary | ICD-10-CM | POA: Diagnosis not present

## 2017-12-12 DIAGNOSIS — D62 Acute posthemorrhagic anemia: Secondary | ICD-10-CM | POA: Diagnosis not present

## 2017-12-12 DIAGNOSIS — I10 Essential (primary) hypertension: Secondary | ICD-10-CM | POA: Diagnosis not present

## 2017-12-12 DIAGNOSIS — I780 Hereditary hemorrhagic telangiectasia: Secondary | ICD-10-CM | POA: Diagnosis not present

## 2017-12-12 DIAGNOSIS — K922 Gastrointestinal hemorrhage, unspecified: Secondary | ICD-10-CM | POA: Diagnosis not present

## 2017-12-13 DIAGNOSIS — K3182 Dieulafoy lesion (hemorrhagic) of stomach and duodenum: Secondary | ICD-10-CM | POA: Diagnosis not present

## 2017-12-13 DIAGNOSIS — D62 Acute posthemorrhagic anemia: Secondary | ICD-10-CM | POA: Diagnosis not present

## 2017-12-13 DIAGNOSIS — K922 Gastrointestinal hemorrhage, unspecified: Secondary | ICD-10-CM | POA: Diagnosis not present

## 2017-12-13 DIAGNOSIS — I10 Essential (primary) hypertension: Secondary | ICD-10-CM | POA: Diagnosis not present

## 2017-12-14 ENCOUNTER — Other Ambulatory Visit: Payer: Self-pay | Admitting: Internal Medicine

## 2017-12-18 ENCOUNTER — Telehealth: Payer: Self-pay | Admitting: Hematology

## 2017-12-18 ENCOUNTER — Other Ambulatory Visit: Payer: Self-pay | Admitting: Hematology

## 2017-12-18 DIAGNOSIS — D500 Iron deficiency anemia secondary to blood loss (chronic): Secondary | ICD-10-CM

## 2017-12-18 NOTE — Telephone Encounter (Signed)
Called regarding 9/3 sch msg °

## 2017-12-19 ENCOUNTER — Other Ambulatory Visit: Payer: Self-pay | Admitting: Nurse Practitioner

## 2017-12-19 ENCOUNTER — Inpatient Hospital Stay: Payer: Medicaid Other

## 2017-12-19 ENCOUNTER — Encounter: Payer: Self-pay | Admitting: Nurse Practitioner

## 2017-12-19 ENCOUNTER — Inpatient Hospital Stay: Payer: Medicaid Other | Attending: Hematology | Admitting: Nurse Practitioner

## 2017-12-19 ENCOUNTER — Telehealth: Payer: Self-pay | Admitting: Nurse Practitioner

## 2017-12-19 VITALS — BP 124/71 | HR 62 | Temp 97.9°F | Resp 18 | Ht 65.0 in | Wt 248.3 lb

## 2017-12-19 DIAGNOSIS — I10 Essential (primary) hypertension: Secondary | ICD-10-CM

## 2017-12-19 DIAGNOSIS — I780 Hereditary hemorrhagic telangiectasia: Secondary | ICD-10-CM | POA: Diagnosis not present

## 2017-12-19 DIAGNOSIS — R634 Abnormal weight loss: Secondary | ICD-10-CM

## 2017-12-19 DIAGNOSIS — R5383 Other fatigue: Secondary | ICD-10-CM

## 2017-12-19 DIAGNOSIS — E876 Hypokalemia: Secondary | ICD-10-CM

## 2017-12-19 DIAGNOSIS — K922 Gastrointestinal hemorrhage, unspecified: Secondary | ICD-10-CM | POA: Diagnosis not present

## 2017-12-19 DIAGNOSIS — D500 Iron deficiency anemia secondary to blood loss (chronic): Secondary | ICD-10-CM

## 2017-12-19 DIAGNOSIS — R040 Epistaxis: Secondary | ICD-10-CM | POA: Diagnosis not present

## 2017-12-19 DIAGNOSIS — R630 Anorexia: Secondary | ICD-10-CM | POA: Diagnosis not present

## 2017-12-19 DIAGNOSIS — Z5112 Encounter for antineoplastic immunotherapy: Secondary | ICD-10-CM | POA: Insufficient documentation

## 2017-12-19 LAB — CMP (CANCER CENTER ONLY)
ALT: <6 U/L (ref 0–44)
ANION GAP: 8 (ref 5–15)
AST: 11 U/L — AB (ref 15–41)
Albumin: 2.8 g/dL — ABNORMAL LOW (ref 3.5–5.0)
Alkaline Phosphatase: 86 U/L (ref 38–126)
BUN: 6 mg/dL (ref 6–20)
CHLORIDE: 113 mmol/L — AB (ref 98–111)
CO2: 24 mmol/L (ref 22–32)
Calcium: 9 mg/dL (ref 8.9–10.3)
Creatinine: 0.85 mg/dL (ref 0.44–1.00)
GFR, Est AFR Am: >60 mL/min (ref >60)
Glucose, Bld: 72 mg/dL (ref 70–99)
POTASSIUM: 3 mmol/L — AB (ref 3.5–5.1)
Sodium: 145 mmol/L (ref 135–145)
Total Protein: 6.4 g/dL — ABNORMAL LOW (ref 6.5–8.1)

## 2017-12-19 LAB — CBC WITH DIFFERENTIAL (CANCER CENTER ONLY)
BASOS ABS: 0 10*3/uL (ref 0.0–0.1)
BASOS PCT: 0 %
EOS ABS: 0.1 10*3/uL (ref 0.0–0.5)
Eosinophils Relative: 1 %
HCT: 30.1 % — ABNORMAL LOW (ref 34.8–46.6)
Hemoglobin: 9.1 g/dL — ABNORMAL LOW (ref 11.6–15.9)
LYMPHS ABS: 0.9 10*3/uL (ref 0.9–3.3)
Lymphocytes Relative: 9 %
MCH: 27.8 pg (ref 25.1–34.0)
MCHC: 30.2 g/dL — ABNORMAL LOW (ref 31.5–36.0)
MCV: 92 fL (ref 79.5–101.0)
Monocytes Absolute: 0.6 10*3/uL (ref 0.1–0.9)
Monocytes Relative: 6 %
NEUTROS PCT: 84 %
Neutro Abs: 8.1 10*3/uL — ABNORMAL HIGH (ref 1.5–6.5)
PLATELETS: 411 10*3/uL — AB (ref 145–400)
RBC: 3.27 MIL/uL — AB (ref 3.70–5.45)
RDW: 15.7 % — ABNORMAL HIGH (ref 11.2–14.5)
WBC: 9.8 10*3/uL (ref 3.9–10.3)

## 2017-12-19 LAB — IRON AND TIBC
Iron: 55 ug/dL (ref 41–142)
Saturation Ratios: 24 % (ref 21–57)
TIBC: 224 ug/dL — ABNORMAL LOW (ref 236–444)
UIBC: 169 ug/dL

## 2017-12-19 LAB — RETICULOCYTES
RBC.: 3.28 MIL/uL — AB (ref 3.87–5.11)
RETIC COUNT ABSOLUTE: 78.7 10*3/uL (ref 19.0–186.0)
RETIC CT PCT: 2.4 % (ref 0.4–3.1)

## 2017-12-19 LAB — TOTAL PROTEIN, URINE DIPSTICK

## 2017-12-19 LAB — FERRITIN: Ferritin: 273 ng/mL (ref 11–307)

## 2017-12-19 MED ORDER — TAMOXIFEN CITRATE 20 MG PO TABS: 20.0000 mg | ORAL_TABLET | Freq: Every day | ORAL | 3 refills | Status: DC

## 2017-12-19 MED ORDER — POTASSIUM CHLORIDE CRYS ER 20 MEQ PO TBCR: 20.0000 meq | EXTENDED_RELEASE_TABLET | Freq: Every day | ORAL | 0 refills | Status: DC

## 2017-12-19 NOTE — Telephone Encounter (Signed)
Scheduled appt per 9/6 los - gave patient AVS and calender per los.   

## 2017-12-19 NOTE — Progress Notes (Addendum)
[place]  [phone] [phone]  Clinic Follow up Note   Patient Care Team: Einar Gip, DO as PCP - General (Internal Medicine) 12/19/2017  DIAGNOSIS: Hereditary hemorrhagic telangiectasia, iron deficiency anemia, recurrent GI bleeding   PREVIOUS AND CURRENT THERAPY 1.  Blood transfusion for severe anemia secondary to GI bleeding, > 20 units so far  2. Multiple EGD, small bowel enteroscope, C-scope with APC 3. Oral and iv amicar were given during hospital stay, no effect  4. Tamoxifen started in 10/2017 for HHT 6. Avastin on 8/2 and 8/30 at [facility] 7. IR embolization of of the left gastric and short gastric artery branch vessels without evidence of residual flow in the Dieulafoy lesion on 12/10/2017  INTERVAL HISTORY: [name] returns for follow up. She was last seen 10/15/17 and was directly admitted to [facility] hospital for GI bleeding. Since then she has had multiple admissions to [facility] and [facility] hospital for recurrent GI bleeding. Most recent admission to [place] from 8/25 - 12/13/17. She has undergone multple endoscopies including EGD on 7/27 with clot in stomach adjacent to previously clipped AVM, clot could not be removed; EGD 7/30 showed non bleeding AVMs in stomach; EGD 8/2 with multple nonbleeding AVMs in stomach and APC applied. 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 Capsule endoscopy on 8/15 showed red blood in stomach, no bleeding in small bowel.   She had ENT eval, bleeding source was felt to be nasopharynx. Treated with protonix, octreotide, and tamoxifen. amica injections were added and she completed avstin on 8/2. She continued to have bleeding and required frequent RBC transfusions and IV iron. She was discharged on 8/22 and readmitted shortly after for Hgb 6.7. SBE on 8/26 showed  oozing and additional clip was placed. 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 Iron dextran was given 8/28, amicar given 8/25 - 8/28, and she received avastin 8/30. She was disharged home on 8/31 with Hgb 9.6.  Since discharge she feels OK. She has intermittent fatigue. Requires assistance with some ADLs bc when she bends down she may have a nosebleed. She has recurrent nosebleeds every other day. Denies rectal bleeding or black stools since hospital discharge. Her appetite is fair, she has lost significant weight since last f/u. She notes she did not eat much food in hospital and was frequently NPO for procedures. Denies n/v/c/d, hematuria, cough, chest pain, dyspnea, palpitations, lightheadedness, or dizziness. She has occasional chill without fever. Over the past few days she has intermittent middle low back pain, 7/10 today. Controlled with tylenol. She used to receive steroid injections in that area per ortho. She continues fergon 1 tab TID and tamoxifen daily.     MEDICAL HISTORY:  Past Medical History:  Diagnosis Date  . Anxiety   . Arthritis    knnes,back  . GERD (gastroesophageal reflux disease)   . History of swelling of feet   . Hyperlipidemia   . Hypertension   . Major depressive disorder, recurrent episode ([place]) 06/05/2015  . Obesity   . Snores     SURGICAL HISTORY: Past Surgical History:  Procedure Laterality Date  . ABDOMINAL HYSTERECTOMY    . CARPAL TUNNEL RELEASE  05/13/2011   Procedure: CARPAL TUNNEL RELEASE;  Surgeon: Nita Sells, MD;  Location: [place];  Service: Orthopedics;  Laterality: Left;  . COLONOSCOPY WITH PROPOFOL N/A 04/28/2014   Procedure: COLONOSCOPY WITH PROPOFOL;  Surgeon: Cleotis Nipper, MD;  Location: [facility] ENDOSCOPY;  Service: Endoscopy;  Laterality: N/A;  . DG TOES*L*  2/10   rt  . DILATION AND CURETTAGE OF UTERUS    . ENTEROSCOPY N/A 10/17/2017   Procedure: ENTEROSCOPY;  Surgeon: Otis Brace, MD;   Location: [place] ENDOSCOPY;  Service: Gastroenterology;  Laterality: N/A;  . ESOPHAGOGASTRODUODENOSCOPY N/A 04/10/2014   Procedure: ESOPHAGOGASTRODUODENOSCOPY (EGD);  Surgeon: [name], MD;  Location: [facility] ENDOSCOPY;  Service: Endoscopy;  Laterality: N/A;  . ESOPHAGOGASTRODUODENOSCOPY N/A 05/10/2017   Procedure: ESOPHAGOGASTRODUODENOSCOPY (EGD);  Surgeon: Ronald Lobo, MD;  Location: [facility] ENDOSCOPY;  Service: Endoscopy;  Laterality: N/A;  . ESOPHAGOGASTRODUODENOSCOPY N/A 09/22/2017   Procedure: ESOPHAGOGASTRODUODENOSCOPY (EGD);  Surgeon: Clarene Essex, MD;  Location: [place];  Service: Endoscopy;  Laterality: N/A;  bedside  . ESOPHAGOGASTRODUODENOSCOPY (EGD) WITH PROPOFOL N/A 04/27/2014   Procedure: ESOPHAGOGASTRODUODENOSCOPY (EGD) WITH PROPOFOL;  Surgeon: Cleotis Nipper, MD;  Location: [place];  Service: Endoscopy;  Laterality: N/A;  possible apc  . ESOPHAGOGASTRODUODENOSCOPY (EGD) WITH PROPOFOL N/A 09/30/2017   Procedure: ESOPHAGOGASTRODUODENOSCOPY (EGD) WITH PROPOFOL;  Surgeon: [name], MD;  Location: [place];  Service: Gastroenterology;  Laterality: N/A;  . ESOPHAGOGASTRODUODENOSCOPY (EGD) WITH PROPOFOL N/A 10/01/2017   Procedure: ESOPHAGOGASTRODUODENOSCOPY (EGD) WITH PROPOFOL;  Surgeon: [name], MD;  Location: [place];  Service: Gastroenterology;  Laterality: N/A;  . ESOPHAGOGASTRODUODENOSCOPY (EGD) WITH PROPOFOL N/A 10/08/2017   Procedure: ESOPHAGOGASTRODUODENOSCOPY (EGD) WITH PROPOFOL;  Surgeon: Otis Brace, MD;  Location: [facility] ENDOSCOPY;  Service: Gastroenterology;  Laterality: N/A;  . ESOPHAGOGASTRODUODENOSCOPY (EGD) WITH PROPOFOL N/A 10/17/2017   Procedure: ESOPHAGOGASTRODUODENOSCOPY (EGD) WITH PROPOFOL;  Surgeon: Otis Brace, MD;  Location: [facility] ENDOSCOPY;  Service: Gastroenterology;  Laterality: N/A;  . ESOPHAGOGASTRODUODENOSCOPY (EGD) WITH PROPOFOL N/A 10/19/2017   Procedure: ESOPHAGOGASTRODUODENOSCOPY (EGD) WITH PROPOFOL;  Surgeon: Otis Brace,  MD;  Location: [facility] ENDOSCOPY;  Service: Gastroenterology;  Laterality: N/A;  . GIVENS CAPSULE STUDY N/A 10/02/2017   Procedure: GIVENS CAPSULE STUDY;  Surgeon: [name], MD;  Location: [place];  Service: Gastroenterology;  Laterality: N/A;  . GIVENS CAPSULE STUDY N/A 10/08/2017   Procedure: GIVENS CAPSULE STUDY;  Surgeon: Otis Brace, MD;  Location: [place];  Service: Gastroenterology;  Laterality: N/A;  endoscopic placement of capsule  . HOT HEMOSTASIS N/A 04/27/2014   Procedure: HOT HEMOSTASIS (ARGON PLASMA COAGULATION/BICAP);  Surgeon: Cleotis Nipper, MD;  Location: [facility] ENDOSCOPY;  Service: Endoscopy;  Laterality: N/A;  . HOT HEMOSTASIS N/A 09/30/2017   Procedure: HOT HEMOSTASIS (ARGON PLASMA COAGULATION/BICAP);  Surgeon: [name], MD;  Location: [place];  Service: Gastroenterology;  Laterality: N/A;  . HOT HEMOSTASIS N/A 10/01/2017   Procedure: HOT HEMOSTASIS (ARGON PLASMA COAGULATION/BICAP);  Surgeon: [name], MD;  Location: [place];  Service: Gastroenterology;  Laterality: N/A;  . HOT HEMOSTASIS N/A 10/17/2017   Procedure: HOT HEMOSTASIS (ARGON PLASMA COAGULATION/BICAP);  Surgeon: Otis Brace, MD;  Location: [facility] ENDOSCOPY;  Service: Gastroenterology;  Laterality: N/A;  . HOT HEMOSTASIS N/A 10/19/2017   Procedure: HOT HEMOSTASIS (ARGON PLASMA COAGULATION/BICAP);  Surgeon: Otis Brace, MD;  Location: [facility] ENDOSCOPY;  Service: Gastroenterology;  Laterality: N/A;  . L shoulder Surgery  2011  . SUBMUCOSAL INJECTION  09/22/2017   Procedure: SUBMUCOSAL INJECTION;  Surgeon: Clarene Essex, MD;  Location: [facility] ENDOSCOPY;  Service: Endoscopy;;    I have reviewed the social history and family history with the patient and they are unchanged from previous note.  ALLERGIES:  is  allergic to feraheme [ferumoxytol]; nsaids; tomato; and wasp venom.  MEDICATIONS:  Current Outpatient Medications  Medication Sig Dispense Refill  . albuterol (PROVENTIL HFA;VENTOLIN HFA) 108  (90 Base) MCG/ACT inhaler Inhale 1-2 puffs into the lungs every 6 (six) hours as needed for wheezing or shortness of breath. 1 Inhaler 3  . ALPRAZolam (XANAX) 0.5 MG tablet Take 1 tablet (0.5 mg total) by mouth 2 (two) times daily as needed for anxiety or sleep. 60 tablet 1  . amLODipine (NORVASC) 10 MG tablet Take 10 mg by mouth daily.    [name] atorvastatin (LIPITOR) 80 MG tablet take 1 tablet by mouth once daily 30 tablet 11  . ferrous gluconate (FERGON) 324 MG tablet Take 1 tablet (324 mg total) by mouth 3 (three) times daily with meals. 90 tablet 0  . pantoprazole (PROTONIX) 40 MG tablet Take 1 tablet (40 mg total) by mouth 2 (two) times daily. 180 tablet 0  . sertraline (ZOLOFT) 50 MG tablet TAKE 1 TABLET BY MOUTH EVERY DAY 90 tablet 1  . tamoxifen (NOLVADEX) 20 MG tablet Take 1 tablet (20 mg total) by mouth daily. 30 tablet 3  . potassium chloride SA (K-DUR,KLOR-CON) 20 MEQ tablet Take 1 tablet (20 mEq total) by mouth daily. 7 tablet 0   No current facility-administered medications for this visit.     PHYSICAL EXAMINATION: ECOG PERFORMANCE STATUS: 2 - Symptomatic, <50% confined to bed  Vitals:   12/19/17 1034  BP: 124/71  Pulse: 62  Resp: 18  Temp: 97.9 F (36.6 C)  SpO2: 100%   Filed Weights   12/19/17 1034  Weight: 248 lb 4.8 oz (112.6 kg)    GENERAL:alert, no distress and comfortable SKIN: no rashes or significant lesions EYES: sclera clear OROPHARYNX:no thrush or ulcers  LYMPH:  no palpable cervical or supraclavicular lymphadenopathy LUNGS: clear to auscultation with normal breathing effort HEART: regular rate & rhythm. Trace bilateral lower extremity edema with venous stasis changes ABDOMEN:abdomen soft, non-tender and normal bowel sounds Musculoskeletal:no cyanosis of digits and no clubbing  NEURO: alert & oriented x 3 with fluent speech, no focal motor/sensory deficits  LABORATORY DATA:  I have reviewed the data as listed CBC Latest Ref Rng & Units 12/19/2017  12/06/2017 11/07/2017  WBC 3.9 - 10.3 K/uL 9.8 15.0(H) -  Hemoglobin 11.6 - 15.9 g/dL 9.1(L) 6.7(LL) 6.1(LL)  Hematocrit 34.8 - 46.6 % 30.1(L) 23.2(L) 18.0(L)  Platelets 145 - 400 K/uL 411(H) 445(H) -     CMP Latest Ref Rng & Units 12/19/2017 12/06/2017 11/07/2017  Glucose 70 - 99 mg/dL 72 107(H) 133(H)  BUN 6 - 20 mg/dL 6 19 17   Creatinine 0.44 - 1.00 mg/dL 0.85 0.80 0.80  Sodium 135 - 145 mmol/L 145 143 145  Potassium 3.5 - 5.1 mmol/L 3.0(LL) 3.7 4.0  Chloride 98 - 111 mmol/L 113(H) 114(H) 114(H)  CO2 22 - 32 mmol/L 24 20(L) -  Calcium 8.9 - 10.3 mg/dL 9.0 8.2(L) -  Total Protein 6.5 - 8.1 g/dL 6.4(L) 4.9(L) -  Total Bilirubin 0.3 - 1.2 mg/dL <0.2(L) 0.5 -  Alkaline Phos 38 - 126 U/L 86 56 -  AST 15 - 41 U/L 11(L) 16 -  ALT 0 - 44 U/L <6 10 -   SBE 12/08/17 - Normal esophagus  - Four endoclips were found in the stomach in the area of the previous dieulafuoy lesion. One area of erythema right next to the clips started to ooze with irrigation and one hemostat clip was placed over this area with  complete cessation of bleeding.. 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 - Erythematous duodenopathy. - The examined portion of the jejunum was normal. - No specimens collected.    IR embolization 12/10/17 1. Enlarged irregular appearing vessel in the region of previously described Dieulafoy lesion with contribution from branches of the short gastric and left gastric arteries. 2. Successful particle and coil embolization of the left gastric and short gastric artery branch vessels without evidence of residual flow in the Dieulafoy lesion.    RADIOGRAPHIC STUDIES: I have personally reviewed the radiological images as listed and agreed with the findings in the report. No results found.   ASSESSMENT & PLAN: Peggy House a56 y.o.African-American female with a history ofGERD, HLD, HTN and obesity.  1. Hereditary hemorrhagic telangiectasia 2.  Anemia of recurrent GI  bleeding and iron deficiency 3. Chronic epistaxis, h/o recurrent GI bleeding  4. Smoking cessation 5. Health maintenance, cancer screening  6. Weight loss, decreased appetite, fatigue  7. Hypokalemia   Ms. Isley appears stable. Hgb stable at 9.1. She denies recurrent bleeding since most recent hospitalization and IR embolization. She tolerated avastin and supportive care in the hospital. The patient was seen with Dr. Burr Medico who plans to continue avastin 5 mg/kg q2 weeks, for approx 4-6 treatments. Will reevaluate the care plan at that time. We reviewed possible side effects of avastin including but not limited to HTN, proteinuria, bleeding, and VTE. She consents. She continues tamoxifen daily and oral fergon 1 tab TID.  Due to her weight loss, fatigue, and deconditioning Dr. Burr Medico recommends home PT/OT to help her gain strength. She agrees. I recommend she add nutrition supplements to her diet to prevent further weight loss. Her potassium is low, K 3.0, likely related to decreased po intake and poor nutrition, I prescribed oral K supplement and will recheck in 1 week.   She will return for close f/u with weekly lab. She will continue with q2 week avastin, due in 1 week. We will f/u in 3 weeks, or sooner if needed. We reviewed s/sx she should call to report such as black stool, recurrent bleeding, increased fatigue, or dyspnea. She understands.   PLAN: -Lab weekly x5 -avastin 5 mg/kg in 1, 3, and 5 weeks  -F/u with Dr. Burr Medico or me in 3 weeks  -Continue tamoxifen and oral iron  -Referral to home health for PT/OT at home  -reviewed s/sx to report such as recurrent bleeding, black stool, increased fatigue, etc    Orders Placed This Encounter  Procedures  . Total Protein, Urine dipstick    Standing Status:   Standing    Number of Occurrences:   10    Standing Expiration Date:   12/20/2018   All questions were answered. The patient knows to call the clinic with any problems, questions or concerns.  No barriers to learning was detected.     Alla Feeling, NP 12/19/17   Addendum I have seen the patient, examined her. I agree with the assessment and and plan and have edited the notes.   [name] had a prolonged hospital stay at [facility] for persistent GI bleeding which required frequent transfusions.  She finally underwent IR embolization recently, and also received Avastin 2 doses during her hospital stay at [facility].  She was finally discharged home last week, has been stable since then, no overt GI bleeding.  Her hemoglobin is stable today.  She does not need blood transfusion today.  I recommend continue tamoxifen, and Avastin every 2  weeks for additional 2-4 treatments for her HHT. She is certainly at high risk for recurrent GI bleeding from AVM, will monitor her CBC weekly.  She knows to call us if she noticed overt GI bleeding.  Truitt Merle  12/19/2017

## 2017-12-26 ENCOUNTER — Inpatient Hospital Stay: Payer: Medicaid Other

## 2017-12-26 VITALS — BP 139/72 | HR 67 | Temp 98.4°F | Resp 16

## 2017-12-26 DIAGNOSIS — R040 Epistaxis: Secondary | ICD-10-CM | POA: Diagnosis not present

## 2017-12-26 DIAGNOSIS — R5383 Other fatigue: Secondary | ICD-10-CM | POA: Diagnosis not present

## 2017-12-26 DIAGNOSIS — R634 Abnormal weight loss: Secondary | ICD-10-CM | POA: Diagnosis not present

## 2017-12-26 DIAGNOSIS — I780 Hereditary hemorrhagic telangiectasia: Secondary | ICD-10-CM

## 2017-12-26 DIAGNOSIS — D500 Iron deficiency anemia secondary to blood loss (chronic): Secondary | ICD-10-CM | POA: Diagnosis not present

## 2017-12-26 DIAGNOSIS — K922 Gastrointestinal hemorrhage, unspecified: Secondary | ICD-10-CM | POA: Diagnosis not present

## 2017-12-26 DIAGNOSIS — I10 Essential (primary) hypertension: Secondary | ICD-10-CM | POA: Diagnosis not present

## 2017-12-26 DIAGNOSIS — R630 Anorexia: Secondary | ICD-10-CM | POA: Diagnosis not present

## 2017-12-26 DIAGNOSIS — E876 Hypokalemia: Secondary | ICD-10-CM | POA: Diagnosis not present

## 2017-12-26 DIAGNOSIS — Z5112 Encounter for antineoplastic immunotherapy: Secondary | ICD-10-CM | POA: Diagnosis not present

## 2017-12-26 LAB — CMP (CANCER CENTER ONLY)
ALBUMIN: 3.1 g/dL — AB (ref 3.5–5.0)
ALT: <6 U/L (ref 0–44)
AST: 11 U/L — AB (ref 15–41)
Alkaline Phosphatase: 77 U/L (ref 38–126)
Anion gap: 8 (ref 5–15)
BUN: 6 mg/dL (ref 6–20)
CHLORIDE: 109 mmol/L (ref 98–111)
CO2: 26 mmol/L (ref 22–32)
Calcium: 9 mg/dL (ref 8.9–10.3)
Creatinine: 0.81 mg/dL (ref 0.44–1.00)
GFR, Est AFR Am: >60 mL/min (ref >60)
GLUCOSE: 111 mg/dL — AB (ref 70–99)
POTASSIUM: 3.5 mmol/L (ref 3.5–5.1)
Sodium: 143 mmol/L (ref 135–145)
Total Bilirubin: 0.3 mg/dL (ref 0.3–1.2)
Total Protein: 6.9 g/dL (ref 6.5–8.1)

## 2017-12-26 LAB — CBC WITH DIFFERENTIAL (CANCER CENTER ONLY)
Basophils Absolute: 0 10*3/uL (ref 0.0–0.1)
Basophils Relative: 0 %
EOS PCT: 1 %
Eosinophils Absolute: 0.1 10*3/uL (ref 0.0–0.5)
HEMATOCRIT: 31.6 % — AB (ref 34.8–46.6)
Hemoglobin: 9.8 g/dL — ABNORMAL LOW (ref 11.6–15.9)
LYMPHS ABS: 1.1 10*3/uL (ref 0.9–3.3)
LYMPHS PCT: 10 %
MCH: 27.9 pg (ref 25.1–34.0)
MCHC: 31 g/dL — ABNORMAL LOW (ref 31.5–36.0)
MCV: 90 fL (ref 79.5–101.0)
Monocytes Absolute: 0.5 10*3/uL (ref 0.1–0.9)
Monocytes Relative: 4 %
Neutro Abs: 9.8 10*3/uL — ABNORMAL HIGH (ref 1.5–6.5)
Neutrophils Relative %: 85 %
PLATELETS: 261 10*3/uL (ref 145–400)
RBC: 3.51 MIL/uL — ABNORMAL LOW (ref 3.70–5.45)
RDW: 16.8 % — ABNORMAL HIGH (ref 11.2–14.5)
WBC: 11.6 10*3/uL — AB (ref 3.9–10.3)

## 2017-12-26 LAB — RETICULOCYTES
RBC.: 3.51 MIL/uL — ABNORMAL LOW (ref 3.70–5.45)
Retic Count, Absolute: 119.3 10*3/uL — ABNORMAL HIGH (ref 33.7–90.7)
Retic Ct Pct: 3.4 % — ABNORMAL HIGH (ref 0.7–2.1)

## 2017-12-26 MED ORDER — SODIUM CHLORIDE 0.9 % IV SOLN
Freq: Once | INTRAVENOUS | Status: AC
  Administered 2017-12-26: 15:00:00 via INTRAVENOUS
  Filled 2017-12-26: qty 250

## 2017-12-26 MED ORDER — SODIUM CHLORIDE 0.9 % IV SOLN
600.0000 mg | Freq: Once | INTRAVENOUS | Status: AC
  Administered 2017-12-26: 600 mg via INTRAVENOUS
  Filled 2017-12-26: qty 8

## 2017-12-26 NOTE — Patient Instructions (Signed)
[place] Discharge Instructions for Patients Receiving Chemotherapy  Today you received the following chemotherapy agents:  Avastin, (bevacizumab)  To help prevent nausea and vomiting after your treatment, we encourage you to take your nausea medication as prescribed.   If you develop nausea and vomiting that is not controlled by your nausea medication, call the clinic.   BELOW ARE SYMPTOMS THAT SHOULD BE REPORTED IMMEDIATELY:  *FEVER GREATER THAN 100.5 F  *CHILLS WITH OR WITHOUT FEVER  NAUSEA AND VOMITING THAT IS NOT CONTROLLED WITH YOUR NAUSEA MEDICATION  *UNUSUAL SHORTNESS OF BREATH  *UNUSUAL BRUISING OR BLEEDING  TENDERNESS IN MOUTH AND THROAT WITH OR WITHOUT PRESENCE OF ULCERS  *URINARY PROBLEMS  *BOWEL PROBLEMS  UNUSUAL RASH Items with * indicate a potential emergency and should be followed up as soon as possible.  Feel free to call the clinic should you have any questions or concerns. The clinic phone number is [phone].  Please show the [place] at check-in to the Emergency Department and triage nurse.     Bevacizumab injection What is this medicine? BEVACIZUMAB (be va SIZ yoo mab) is a monoclonal antibody. It is used to treat many types of cancer. This medicine may be used for other purposes; ask your health care provider or pharmacist if you have questions. COMMON BRAND NAME(S): Avastin What should I tell my health care provider before I take this medicine? They need to know if you have any of these conditions: -diabetes -heart disease -high blood pressure -history of coughing up blood -prior anthracycline chemotherapy (e.g., doxorubicin, daunorubicin, epirubicin) -recent or ongoing radiation therapy -recent or planning to have surgery -stroke -an unusual or allergic reaction to bevacizumab, hamster proteins, mouse proteins, other medicines, foods, dyes, or preservatives -pregnant or trying to get  pregnant -breast-feeding How should I use this medicine? This medicine is for infusion into a vein. It is given by a health care professional in a hospital or clinic setting. Talk to your pediatrician regarding the use of this medicine in children. Special care may be needed. Overdosage: If you think you have taken too much of this medicine contact a poison control center or emergency room at once. NOTE: This medicine is only for you. Do not share this medicine with others. What if I miss a dose? It is important not to miss your dose. Call your doctor or health care professional if you are unable to keep an appointment. What may interact with this medicine? Interactions are not expected. This list may not describe all possible interactions. Give your health care provider a list of all the medicines, herbs, non-prescription drugs, or dietary supplements you use. Also tell them if you smoke, drink alcohol, or use illegal drugs. Some items may interact with your medicine. What should I watch for while using this medicine? Your condition will be monitored carefully while you are receiving this medicine. You will need important blood work and urine testing done while you are taking this medicine. This medicine may increase your risk to bruise or bleed. Call your doctor or health care professional if you notice any unusual bleeding. This medicine should be started at least 28 days following major surgery and the site of the surgery should be totally healed. Check with your doctor before scheduling dental work or surgery while you are receiving this treatment. Talk to your doctor if you have recently had surgery or if you have a wound that has not healed. Do not become pregnant while taking this  medicine or for 6 months after stopping it. Women should inform their doctor if they wish to become pregnant or think they might be pregnant. There is a potential for serious side effects to an unborn child. Talk to  your health care professional or pharmacist for more information. Do not breast-feed an infant while taking this medicine and for 6 months after the last dose. This medicine has caused ovarian failure in some women. This medicine may interfere with the ability to have a child. You should talk to your doctor or health care professional if you are concerned about your fertility. What side effects may I notice from receiving this medicine? Side effects that you should report to your doctor or health care professional as soon as possible: -allergic reactions like skin rash, itching or hives, swelling of the face, lips, or tongue -chest pain or chest tightness -chills -coughing up blood -high fever -seizures -severe constipation -signs and symptoms of bleeding such as bloody or black, tarry stools; red or dark-brown urine; spitting up blood or brown material that looks like coffee grounds; red spots on the skin; unusual bruising or bleeding from the eye, gums, or nose -signs and symptoms of a blood clot such as breathing problems; chest pain; severe, sudden headache; pain, swelling, warmth in the leg -signs and symptoms of a stroke like changes in vision; confusion; trouble speaking or understanding; severe headaches; sudden numbness or weakness of the face, arm or leg; trouble walking; dizziness; loss of balance or coordination -stomach pain -sweating -swelling of legs or ankles -vomiting -weight gain Side effects that usually do not require medical attention (report to your doctor or health care professional if they continue or are bothersome): -back pain -changes in taste -decreased appetite -dry skin -nausea -tiredness This list may not describe all possible side effects. Call your doctor for medical advice about side effects. You may report side effects to FDA at 1-800-FDA-1088. Where should I keep my medicine? This drug is given in a hospital or clinic and will not be stored at  home. NOTE: This sheet is a summary. It may not cover all possible information. If you have questions about this medicine, talk to your doctor, pharmacist, or health care provider.  2018 Elsevier/Gold Standard (2016-03-29 14:33:29)

## 2018-01-02 ENCOUNTER — Inpatient Hospital Stay: Payer: Medicaid Other

## 2018-01-02 DIAGNOSIS — R040 Epistaxis: Secondary | ICD-10-CM | POA: Diagnosis not present

## 2018-01-02 DIAGNOSIS — R5383 Other fatigue: Secondary | ICD-10-CM | POA: Diagnosis not present

## 2018-01-02 DIAGNOSIS — K922 Gastrointestinal hemorrhage, unspecified: Secondary | ICD-10-CM | POA: Diagnosis not present

## 2018-01-02 DIAGNOSIS — D500 Iron deficiency anemia secondary to blood loss (chronic): Secondary | ICD-10-CM | POA: Diagnosis not present

## 2018-01-02 DIAGNOSIS — Z5112 Encounter for antineoplastic immunotherapy: Secondary | ICD-10-CM | POA: Diagnosis not present

## 2018-01-02 DIAGNOSIS — E876 Hypokalemia: Secondary | ICD-10-CM | POA: Diagnosis not present

## 2018-01-02 DIAGNOSIS — R634 Abnormal weight loss: Secondary | ICD-10-CM | POA: Diagnosis not present

## 2018-01-02 DIAGNOSIS — I780 Hereditary hemorrhagic telangiectasia: Secondary | ICD-10-CM | POA: Diagnosis not present

## 2018-01-02 DIAGNOSIS — R630 Anorexia: Secondary | ICD-10-CM | POA: Diagnosis not present

## 2018-01-02 DIAGNOSIS — I10 Essential (primary) hypertension: Secondary | ICD-10-CM | POA: Diagnosis not present

## 2018-01-02 LAB — CMP (CANCER CENTER ONLY)
ALBUMIN: 3.4 g/dL — AB (ref 3.5–5.0)
ALT: <6 U/L (ref 0–44)
AST: 12 U/L — AB (ref 15–41)
Alkaline Phosphatase: 89 U/L (ref 38–126)
Anion gap: 8 (ref 5–15)
BUN: 7 mg/dL (ref 6–20)
CHLORIDE: 108 mmol/L (ref 98–111)
CO2: 26 mmol/L (ref 22–32)
Calcium: 9 mg/dL (ref 8.9–10.3)
Creatinine: 0.8 mg/dL (ref 0.44–1.00)
GFR, Est AFR Am: >60 mL/min (ref >60)
GFR, Estimated: >60 mL/min (ref >60)
Glucose, Bld: 144 mg/dL — ABNORMAL HIGH (ref 70–99)
POTASSIUM: 3.4 mmol/L — AB (ref 3.5–5.1)
SODIUM: 142 mmol/L (ref 135–145)
Total Bilirubin: 0.3 mg/dL (ref 0.3–1.2)
Total Protein: 7.5 g/dL (ref 6.5–8.1)

## 2018-01-02 LAB — CBC WITH DIFFERENTIAL (CANCER CENTER ONLY)
BASOS ABS: 0 10*3/uL (ref 0.0–0.1)
Basophils Relative: 0 %
Eosinophils Absolute: 0.1 10*3/uL (ref 0.0–0.5)
Eosinophils Relative: 1 %
HEMATOCRIT: 35.4 % (ref 34.8–46.6)
HEMOGLOBIN: 10.8 g/dL — AB (ref 11.6–15.9)
LYMPHS PCT: 10 %
Lymphs Abs: 1 10*3/uL (ref 0.9–3.3)
MCH: 27.3 pg (ref 25.1–34.0)
MCHC: 30.5 g/dL — ABNORMAL LOW (ref 31.5–36.0)
MCV: 89.4 fL (ref 79.5–101.0)
MONO ABS: 0.5 10*3/uL (ref 0.1–0.9)
MONOS PCT: 5 %
NEUTROS ABS: 7.9 10*3/uL — AB (ref 1.5–6.5)
NEUTROS PCT: 84 %
Platelet Count: 197 10*3/uL (ref 145–400)
RBC: 3.96 MIL/uL (ref 3.70–5.45)
RDW: 17 % — ABNORMAL HIGH (ref 11.2–14.5)
WBC Count: 9.4 10*3/uL (ref 3.9–10.3)

## 2018-01-02 LAB — RETICULOCYTES
RBC.: 3.96 MIL/uL (ref 3.70–5.45)
RETIC COUNT ABSOLUTE: 91.1 10*3/uL — AB (ref 33.7–90.7)
Retic Ct Pct: 2.3 % — ABNORMAL HIGH (ref 0.7–2.1)

## 2018-01-02 LAB — TOTAL PROTEIN, URINE DIPSTICK

## 2018-01-09 ENCOUNTER — Inpatient Hospital Stay: Payer: Medicaid Other | Admitting: Nurse Practitioner

## 2018-01-09 ENCOUNTER — Inpatient Hospital Stay: Payer: Medicaid Other

## 2018-01-09 ENCOUNTER — Telehealth: Payer: Self-pay | Admitting: Nurse Practitioner

## 2018-01-09 NOTE — Telephone Encounter (Signed)
Scheduled appt per 9/27 sch message - unable to reach patient - left message for patient with appt date and time.

## 2018-01-14 ENCOUNTER — Other Ambulatory Visit: Payer: Self-pay | Admitting: Internal Medicine

## 2018-01-15 ENCOUNTER — Telehealth: Payer: Self-pay | Admitting: Hematology

## 2018-01-15 NOTE — Telephone Encounter (Signed)
Patient advised that she had been moved from [facility] schedule to [facility] schedule per Verbal request 9/30 from [facility]

## 2018-01-16 ENCOUNTER — Inpatient Hospital Stay: Payer: Medicaid Other | Attending: Oncology

## 2018-01-16 ENCOUNTER — Inpatient Hospital Stay (HOSPITAL_BASED_OUTPATIENT_CLINIC_OR_DEPARTMENT_OTHER): Payer: Medicaid Other | Admitting: Oncology

## 2018-01-16 ENCOUNTER — Encounter: Payer: Self-pay | Admitting: Oncology

## 2018-01-16 ENCOUNTER — Inpatient Hospital Stay: Payer: Medicaid Other

## 2018-01-16 VITALS — BP 166/86 | Resp 18

## 2018-01-16 VITALS — BP 180/88 | HR 63 | Temp 98.3°F | Resp 18 | Ht 65.0 in | Wt 250.7 lb

## 2018-01-16 DIAGNOSIS — I780 Hereditary hemorrhagic telangiectasia: Secondary | ICD-10-CM

## 2018-01-16 DIAGNOSIS — D500 Iron deficiency anemia secondary to blood loss (chronic): Secondary | ICD-10-CM | POA: Diagnosis not present

## 2018-01-16 DIAGNOSIS — Z5112 Encounter for antineoplastic immunotherapy: Secondary | ICD-10-CM | POA: Diagnosis present

## 2018-01-16 DIAGNOSIS — R5383 Other fatigue: Secondary | ICD-10-CM | POA: Diagnosis not present

## 2018-01-16 DIAGNOSIS — D630 Anemia in neoplastic disease: Secondary | ICD-10-CM | POA: Diagnosis not present

## 2018-01-16 DIAGNOSIS — K922 Gastrointestinal hemorrhage, unspecified: Secondary | ICD-10-CM

## 2018-01-16 DIAGNOSIS — R040 Epistaxis: Secondary | ICD-10-CM | POA: Diagnosis not present

## 2018-01-16 DIAGNOSIS — I10 Essential (primary) hypertension: Secondary | ICD-10-CM

## 2018-01-16 DIAGNOSIS — Z79810 Long term (current) use of selective estrogen receptor modulators (SERMs): Secondary | ICD-10-CM

## 2018-01-16 DIAGNOSIS — E876 Hypokalemia: Secondary | ICD-10-CM

## 2018-01-16 LAB — CMP (CANCER CENTER ONLY)
AST: 12 U/L — AB (ref 15–41)
Albumin: 3.5 g/dL (ref 3.5–5.0)
Alkaline Phosphatase: 79 U/L (ref 38–126)
Anion gap: 12 (ref 5–15)
BUN: 8 mg/dL (ref 6–20)
CHLORIDE: 108 mmol/L (ref 98–111)
CO2: 25 mmol/L (ref 22–32)
CREATININE: 0.77 mg/dL (ref 0.44–1.00)
Calcium: 9.6 mg/dL (ref 8.9–10.3)
GFR, Est AFR Am: >60 mL/min (ref >60)
Glucose, Bld: 92 mg/dL (ref 70–99)
Potassium: 3.4 mmol/L — ABNORMAL LOW (ref 3.5–5.1)
Sodium: 145 mmol/L (ref 135–145)
Total Bilirubin: 0.5 mg/dL (ref 0.3–1.2)
Total Protein: 7.7 g/dL (ref 6.5–8.1)

## 2018-01-16 LAB — CBC WITH DIFFERENTIAL (CANCER CENTER ONLY)
Basophils Absolute: 0 10*3/uL (ref 0.0–0.1)
Basophils Relative: 0 %
EOS ABS: 0.1 10*3/uL (ref 0.0–0.5)
EOS PCT: 1 %
HCT: 36.2 % (ref 34.8–46.6)
Hemoglobin: 11.1 g/dL — ABNORMAL LOW (ref 11.6–15.9)
LYMPHS ABS: 1.3 10*3/uL (ref 0.9–3.3)
LYMPHS PCT: 16 %
MCH: 26.4 pg (ref 25.1–34.0)
MCHC: 30.7 g/dL — AB (ref 31.5–36.0)
MCV: 86 fL (ref 79.5–101.0)
MONO ABS: 0.4 10*3/uL (ref 0.1–0.9)
MONOS PCT: 5 %
Neutro Abs: 6.6 10*3/uL — ABNORMAL HIGH (ref 1.5–6.5)
Neutrophils Relative %: 78 %
PLATELETS: 207 10*3/uL (ref 145–400)
RBC: 4.21 MIL/uL (ref 3.70–5.45)
RDW: 17.5 % — AB (ref 11.2–14.5)
WBC: 8.4 10*3/uL (ref 3.9–10.3)

## 2018-01-16 LAB — RETICULOCYTES
RBC.: 4.21 MIL/uL (ref 3.70–5.45)
RETIC CT PCT: 1.9 % (ref 0.7–2.1)
Retic Count, Absolute: 80 10*3/uL (ref 33.7–90.7)

## 2018-01-16 LAB — IRON AND TIBC
Iron: 33 ug/dL — ABNORMAL LOW (ref 41–142)
Saturation Ratios: 16 % — ABNORMAL LOW (ref 21–57)
TIBC: 208 ug/dL — ABNORMAL LOW (ref 236–444)
UIBC: 174 ug/dL

## 2018-01-16 LAB — FERRITIN: Ferritin: 83 ng/mL (ref 11–307)

## 2018-01-16 LAB — TOTAL PROTEIN, URINE DIPSTICK: Protein, ur: <30 mg/dL — AB

## 2018-01-16 MED ORDER — SODIUM CHLORIDE 0.9 % IV SOLN
5.3000 mg/kg | Freq: Once | INTRAVENOUS | Status: AC
  Administered 2018-01-16: 600 mg via INTRAVENOUS
  Filled 2018-01-16: qty 8

## 2018-01-16 MED ORDER — SODIUM CHLORIDE 0.9 % IV SOLN
Freq: Once | INTRAVENOUS | Status: AC
  Administered 2018-01-16: 11:00:00 via INTRAVENOUS
  Filled 2018-01-16: qty 250

## 2018-01-16 MED ORDER — CLONIDINE HCL 0.1 MG PO TABS
0.1000 mg | ORAL_TABLET | Freq: Once | ORAL | Status: AC
  Administered 2018-01-16: 0.1 mg via ORAL

## 2018-01-16 MED ORDER — CLONIDINE HCL 0.1 MG PO TABS
ORAL_TABLET | ORAL | Status: AC
  Filled 2018-01-16: qty 1

## 2018-01-16 NOTE — Progress Notes (Signed)
Per Erasmo Downer NP, ok to treat today with current b/p.

## 2018-01-16 NOTE — Progress Notes (Signed)
[facility] OFFICE PROGRESS NOTE  [place], [place], DO [place] [address]  DIAGNOSIS: Hereditary hemorrhagic telangiectasia, iron deficiency anemia, recurrent GI bleeding  PRIOR THERAPY: 1.  Blood transfusion for severe anemia secondary to GI bleeding, > 20 units so far  2. Multiple EGD, small bowel enteroscope, C-scope with APC 3. Oral and iv amicar were given during hospital stay, no effect  4. Tamoxifen started in 10/2017 for HHT 5. Avastin on 8/2 and 8/30 at [facility] 6. IR embolization of of the left gastric and short gastric artery branch vessels without evidence of residual flow in the [place] lesion on 12/10/2017  CURRENT THERAPY: Avastin 5 mg/kg given every 2 weeks.  INTERVAL HISTORY: Peggy House 57 y.o. female returns for routine follow-up visit by herself.  The patient is feeling fine today and has no specific complaints except for mild headache.  She denies fevers and chills.  Denies chest pain, shortness of breath, cough, hemoptysis.  Denies nausea, vomiting, constipation, diarrhea.  Denies bleeding.  She reports that she ran out of her blood pressure medication 2 days ago.  She has a follow-up with her primary care provider later today.  She is tolerating her Avastin fairly well.  MEDICAL HISTORY: Past Medical History:  Diagnosis Date  . Anxiety   . Arthritis    knnes,back  . GERD (gastroesophageal reflux disease)   . History of swelling of feet   . Hyperlipidemia   . Hypertension   . Major depressive disorder, recurrent episode ([place]) 06/05/2015  . Obesity   . Snores     ALLERGIES:  is allergic to feraheme [ferumoxytol]; nsaids; tomato; and wasp venom.  MEDICATIONS:  Current Outpatient Medications  Medication Sig Dispense Refill  . albuterol (PROVENTIL HFA;VENTOLIN HFA) 108 (90 Base) MCG/ACT inhaler Inhale 1-2 puffs into the lungs every 6 (six) hours as needed for wheezing or shortness of breath. 1 Inhaler 3  . ALPRAZolam (XANAX) 0.5 MG  tablet Take 1 tablet (0.5 mg total) by mouth 2 (two) times daily as needed for anxiety or sleep. 60 tablet 1  . amLODipine (NORVASC) 10 MG tablet Take 10 mg by mouth daily.    [name] atorvastatin (LIPITOR) 80 MG tablet take 1 tablet by mouth once daily 30 tablet 11  . ferrous gluconate (FERGON) 324 MG tablet Take 1 tablet (324 mg total) by mouth 3 (three) times daily with meals. 90 tablet 0  . pantoprazole (PROTONIX) 40 MG tablet Take 1 tablet (40 mg total) by mouth 2 (two) times daily. 180 tablet 0  . potassium chloride SA (K-DUR,KLOR-CON) 20 MEQ tablet Take 1 tablet (20 mEq total) by mouth daily. 7 tablet 0  . sertraline (ZOLOFT) 50 MG tablet TAKE 1 TABLET BY MOUTH EVERY DAY 90 tablet 1  . tamoxifen (NOLVADEX) 20 MG tablet Take 1 tablet (20 mg total) by mouth daily. 30 tablet 3   No current facility-administered medications for this visit.     SURGICAL HISTORY:  Past Surgical History:  Procedure Laterality Date  . ABDOMINAL HYSTERECTOMY    . CARPAL TUNNEL RELEASE  05/13/2011   Procedure: CARPAL TUNNEL RELEASE;  Surgeon: Nita Sells, MD;  Location: [facility];  Service: Orthopedics;  Laterality: Left;  . COLONOSCOPY WITH PROPOFOL N/A 04/28/2014   Procedure: COLONOSCOPY WITH PROPOFOL;  Surgeon: Cleotis Nipper, MD;  Location: [facility] ENDOSCOPY;  Service: Endoscopy;  Laterality: N/A;  . DG TOES*L*  2/10   rt  . DILATION AND CURETTAGE OF UTERUS    .  ENTEROSCOPY N/A 10/17/2017   Procedure: ENTEROSCOPY;  Surgeon: Otis Brace, MD;  Location: [facility] ENDOSCOPY;  Service: Gastroenterology;  Laterality: N/A;  . ESOPHAGOGASTRODUODENOSCOPY N/A 04/10/2014   Procedure: ESOPHAGOGASTRODUODENOSCOPY (EGD);  Surgeon: [name], MD;  Location: [facility] ENDOSCOPY;  Service: Endoscopy;  Laterality: N/A;  . ESOPHAGOGASTRODUODENOSCOPY N/A 05/10/2017   Procedure: ESOPHAGOGASTRODUODENOSCOPY (EGD);  Surgeon: Ronald Lobo, MD;  Location: [facility] ENDOSCOPY;  Service: Endoscopy;  Laterality: N/A;  .  ESOPHAGOGASTRODUODENOSCOPY N/A 09/22/2017   Procedure: ESOPHAGOGASTRODUODENOSCOPY (EGD);  Surgeon: Clarene Essex, MD;  Location: [facility];  Service: Endoscopy;  Laterality: N/A;  bedside  . ESOPHAGOGASTRODUODENOSCOPY (EGD) WITH PROPOFOL N/A 04/27/2014   Procedure: ESOPHAGOGASTRODUODENOSCOPY (EGD) WITH PROPOFOL;  Surgeon: Cleotis Nipper, MD;  Location: [facility];  Service: Endoscopy;  Laterality: N/A;  possible apc  . ESOPHAGOGASTRODUODENOSCOPY (EGD) WITH PROPOFOL N/A 09/30/2017   Procedure: ESOPHAGOGASTRODUODENOSCOPY (EGD) WITH PROPOFOL;  Surgeon: [name], MD;  Location: [facility];  Service: Gastroenterology;  Laterality: N/A;  . ESOPHAGOGASTRODUODENOSCOPY (EGD) WITH PROPOFOL N/A 10/01/2017   Procedure: ESOPHAGOGASTRODUODENOSCOPY (EGD) WITH PROPOFOL;  Surgeon: [name], MD;  Location: [place];  Service: Gastroenterology;  Laterality: N/A;  . ESOPHAGOGASTRODUODENOSCOPY (EGD) WITH PROPOFOL N/A 10/08/2017   Procedure: ESOPHAGOGASTRODUODENOSCOPY (EGD) WITH PROPOFOL;  Surgeon: Otis Brace, MD;  Location: [facility] ENDOSCOPY;  Service: Gastroenterology;  Laterality: N/A;  . ESOPHAGOGASTRODUODENOSCOPY (EGD) WITH PROPOFOL N/A 10/17/2017   Procedure: ESOPHAGOGASTRODUODENOSCOPY (EGD) WITH PROPOFOL;  Surgeon: Otis Brace, MD;  Location: [facility] ENDOSCOPY;  Service: Gastroenterology;  Laterality: N/A;  . ESOPHAGOGASTRODUODENOSCOPY (EGD) WITH PROPOFOL N/A 10/19/2017   Procedure: ESOPHAGOGASTRODUODENOSCOPY (EGD) WITH PROPOFOL;  Surgeon: Otis Brace, MD;  Location: [facility] ENDOSCOPY;  Service: Gastroenterology;  Laterality: N/A;  . GIVENS CAPSULE STUDY N/A 10/02/2017   Procedure: GIVENS CAPSULE STUDY;  Surgeon: [name], MD;  Location: [facility];  Service: Gastroenterology;  Laterality: N/A;  . GIVENS CAPSULE STUDY N/A 10/08/2017   Procedure: GIVENS CAPSULE STUDY;  Surgeon: Otis Brace, MD;  Location: [facility];  Service: Gastroenterology;  Laterality: N/A;  endoscopic placement of  capsule  . HOT HEMOSTASIS N/A 04/27/2014   Procedure: HOT HEMOSTASIS (ARGON PLASMA COAGULATION/BICAP);  Surgeon: Cleotis Nipper, MD;  Location: [facility] ENDOSCOPY;  Service: Endoscopy;  Laterality: N/A;  . HOT HEMOSTASIS N/A 09/30/2017   Procedure: HOT HEMOSTASIS (ARGON PLASMA COAGULATION/BICAP);  Surgeon: [name], MD;  Location: [facility];  Service: Gastroenterology;  Laterality: N/A;  . HOT HEMOSTASIS N/A 10/01/2017   Procedure: HOT HEMOSTASIS (ARGON PLASMA COAGULATION/BICAP);  Surgeon: [name], MD;  Location: [facility];  Service: Gastroenterology;  Laterality: N/A;  . HOT HEMOSTASIS N/A 10/17/2017   Procedure: HOT HEMOSTASIS (ARGON PLASMA COAGULATION/BICAP);  Surgeon: Otis Brace, MD;  Location: [facility] ENDOSCOPY;  Service: Gastroenterology;  Laterality: N/A;  . HOT HEMOSTASIS N/A 10/19/2017   Procedure: HOT HEMOSTASIS (ARGON PLASMA COAGULATION/BICAP);  Surgeon: Otis Brace, MD;  Location: [facility] ENDOSCOPY;  Service: Gastroenterology;  Laterality: N/A;  . L shoulder Surgery  2011  . SUBMUCOSAL INJECTION  09/22/2017   Procedure: SUBMUCOSAL INJECTION;  Surgeon: Clarene Essex, MD;  Location: [facility];  Service: Endoscopy;;    REVIEW OF SYSTEMS:   Review of Systems  Constitutional: Negative for appetite change, chills, fatigue, fever and unexpected weight change.  HENT:   Negative for mouth sores, nosebleeds, sore throat and trouble swallowing.   Eyes: Negative for eye problems and icterus.  Respiratory: Negative for cough, hemoptysis, shortness of breath and wheezing.   Cardiovascular: Negative for chest pain and leg swelling.  Gastrointestinal: Negative for abdominal pain, constipation, diarrhea, nausea  and vomiting.  Genitourinary: Negative for bladder incontinence, difficulty urinating, dysuria, frequency and hematuria.   Musculoskeletal: Negative for back pain, gait problem, neck pain and neck stiffness.  Skin: Negative for itching and rash.  Neurological: Negative for  dizziness, extremity weakness, gait problem, light-headedness and seizures.  Positive for headaches. Hematological: Negative for adenopathy. Does not bruise/bleed easily.  Psychiatric/Behavioral: Negative for confusion, depression and sleep disturbance. The patient is not nervous/anxious.     PHYSICAL EXAMINATION:  Blood pressure (!) 180/88, pulse 63, temperature 98.3 F (36.8 C), temperature source Oral, resp. rate 18, height 5\' 5"  (1.651 m), weight 250 lb 11.2 oz (113.7 kg), SpO2 100 %.  ECOG PERFORMANCE STATUS: 1 - Symptomatic but completely ambulatory  Physical Exam  Constitutional: Oriented to person, place, and time and well-developed, well-nourished, and in no distress. No distress.  HENT:  Head: Normocephalic and atraumatic.  Mouth/Throat: Oropharynx is clear and moist. No oropharyngeal exudate.  Eyes: Conjunctivae are normal. Right eye exhibits no discharge. Left eye exhibits no discharge. No scleral icterus.  Neck: Normal range of motion. Neck supple.  Cardiovascular: Normal rate, regular rhythm, normal heart sounds and intact distal pulses.   Pulmonary/Chest: Effort normal and breath sounds normal. No respiratory distress. No wheezes. No rales.  Abdominal: Soft. Bowel sounds are normal. Exhibits no distension and no mass. There is no tenderness.  Musculoskeletal: Normal range of motion. Exhibits no edema.  Lymphadenopathy:    No cervical adenopathy.  Neurological: Alert and oriented to person, place, and time. Exhibits normal muscle tone. Gait normal. Coordination normal.  Skin: Skin is warm and dry. No rash noted. Not diaphoretic. No erythema. No pallor.  Psychiatric: Mood, memory and judgment normal.  Vitals reviewed.  LABORATORY DATA: Lab Results  Component Value Date   WBC 8.4 01/16/2018   HGB 11.1 (L) 01/16/2018   HCT 36.2 01/16/2018   MCV 86.0 01/16/2018   PLT 207 01/16/2018      Chemistry      Component Value Date/Time   NA 145 01/16/2018 0832   NA 147 (H)  04/02/2017 1708   NA 141 02/05/2017 1407   K 3.4 (L) 01/16/2018 0832   K 3.5 02/05/2017 1407   CL 108 01/16/2018 0832   CO2 25 01/16/2018 0832   CO2 24 02/05/2017 1407   BUN 8 01/16/2018 0832   BUN 5 (L) 04/02/2017 1708   BUN 9.3 02/05/2017 1407   CREATININE 0.77 01/16/2018 0832   CREATININE 0.9 02/05/2017 1407      Component Value Date/Time   CALCIUM 9.6 01/16/2018 0832   CALCIUM 8.9 02/05/2017 1407   ALKPHOS 79 01/16/2018 0832   ALKPHOS 84 02/05/2017 1407   AST 12 (L) 01/16/2018 0832   AST 13 02/05/2017 1407   ALT <6 01/16/2018 0832   ALT 8 02/05/2017 1407   BILITOT 0.5 01/16/2018 0832   BILITOT 0.23 02/05/2017 1407       RADIOGRAPHIC STUDIES:  No results found.   ASSESSMENT/PLAN:  Peggy House a56 y.o.African-American female with a history ofGERD, HLD, HTN and obesity.  1. Hereditary hemorrhagic telangiectasia 2.  Anemia of recurrent GI bleeding and iron deficiency 3. Chronic epistaxis, h/o recurrent GI bleeding  4. Smoking cessation 5. Health maintenance, cancer screening  6. Weight loss, decreased appetite, fatigue  7. Hypokalemia   Ms. Isley appears stable.  Hemoglobin has improved to 11.1 today.  She denies recurrent bleeding since most recent hospitalization and IR embolization.  She tolerated her last dose of Avastin fairly well.  The plan is to continue avastin 5 mg/kg q2 weeks, for approx 4-6 treatments. Will reevaluate the care plan at that time. She continues tamoxifen daily and oral fergon 1 tab TID.  The patient's blood pressure is elevated today.  She has been out of her amlodipine for the past 2 days.  She has a follow-up appoint with her primary care provider later this afternoon.  I have advised her to request a refill of her blood pressure medication today.  The patient will be given clonidine 0.1 mg p.o. in the infusion area today prior to the Avastin.  She will continue with q2 week avastin.  She will have a follow-up visit in 2 weeks  with her next dose of Avastin.  We reviewed s/sx she should call to report such as black stool, recurrent bleeding, increased fatigue, or dyspnea. She understands.   PLAN: -Proceed with Avastin today as scheduled. -Continue tamoxifen and oral iron  -Clonidine 0.1 mg p.o. in the infusion area today. -Follow-up with primary care provider later today and request refill of amlodipine. -Follow-up visit in 2 weeks with repeat labs and evaluation.  -reviewed s/sx to report such as recurrent bleeding, black stool, increased fatigue, etc   No orders of the defined types were placed in this encounter.    Mikey Bussing, DNP, AGPCNP-BC, AOCNP 01/16/18

## 2018-01-16 NOTE — Patient Instructions (Signed)
[facility] Discharge Instructions for Patients Receiving Chemotherapy  Today you received the following chemotherapy agents:  Avastin, (bevacizumab)  To help prevent nausea and vomiting after your treatment, we encourage you to take your nausea medication as prescribed.   If you develop nausea and vomiting that is not controlled by your nausea medication, call the clinic.   BELOW ARE SYMPTOMS THAT SHOULD BE REPORTED IMMEDIATELY:  *FEVER GREATER THAN 100.5 F  *CHILLS WITH OR WITHOUT FEVER  NAUSEA AND VOMITING THAT IS NOT CONTROLLED WITH YOUR NAUSEA MEDICATION  *UNUSUAL SHORTNESS OF BREATH  *UNUSUAL BRUISING OR BLEEDING  TENDERNESS IN MOUTH AND THROAT WITH OR WITHOUT PRESENCE OF ULCERS  *URINARY PROBLEMS  *BOWEL PROBLEMS  UNUSUAL RASH Items with * indicate a potential emergency and should be followed up as soon as possible.  Feel free to call the clinic should you have any questions or concerns. The clinic phone number is [phone].  Please show the [facility] at check-in to the Emergency Department and triage nurse.

## 2018-01-20 ENCOUNTER — Encounter: Payer: Medicaid Other | Admitting: Internal Medicine

## 2018-01-20 ENCOUNTER — Ambulatory Visit: Payer: Medicaid Other | Admitting: Internal Medicine

## 2018-01-20 ENCOUNTER — Encounter: Payer: Self-pay | Admitting: Internal Medicine

## 2018-01-20 VITALS — BP 173/75 | HR 64 | Temp 97.8°F | Wt 253.6 lb

## 2018-01-20 DIAGNOSIS — I10 Essential (primary) hypertension: Secondary | ICD-10-CM

## 2018-01-20 DIAGNOSIS — R011 Cardiac murmur, unspecified: Secondary | ICD-10-CM | POA: Diagnosis not present

## 2018-01-20 DIAGNOSIS — I780 Hereditary hemorrhagic telangiectasia: Secondary | ICD-10-CM

## 2018-01-20 DIAGNOSIS — D509 Iron deficiency anemia, unspecified: Secondary | ICD-10-CM | POA: Diagnosis not present

## 2018-01-20 DIAGNOSIS — F411 Generalized anxiety disorder: Secondary | ICD-10-CM | POA: Diagnosis not present

## 2018-01-20 DIAGNOSIS — F5101 Primary insomnia: Secondary | ICD-10-CM

## 2018-01-20 DIAGNOSIS — Z2882 Immunization not carried out because of caregiver refusal: Secondary | ICD-10-CM | POA: Diagnosis not present

## 2018-01-20 DIAGNOSIS — Z8719 Personal history of other diseases of the digestive system: Secondary | ICD-10-CM | POA: Diagnosis not present

## 2018-01-20 DIAGNOSIS — G4700 Insomnia, unspecified: Secondary | ICD-10-CM | POA: Diagnosis not present

## 2018-01-20 DIAGNOSIS — E785 Hyperlipidemia, unspecified: Secondary | ICD-10-CM

## 2018-01-20 DIAGNOSIS — Z79899 Other long term (current) drug therapy: Secondary | ICD-10-CM

## 2018-01-20 DIAGNOSIS — Z23 Encounter for immunization: Secondary | ICD-10-CM

## 2018-01-20 DIAGNOSIS — K922 Gastrointestinal hemorrhage, unspecified: Secondary | ICD-10-CM

## 2018-01-20 DIAGNOSIS — D500 Iron deficiency anemia secondary to blood loss (chronic): Secondary | ICD-10-CM

## 2018-01-20 MED ORDER — FERROUS GLUCONATE 324 (38 FE) MG PO TABS: 324.0000 mg | ORAL_TABLET | Freq: Three times a day (TID) | ORAL | 0 refills | Status: DC

## 2018-01-20 MED ORDER — TAMOXIFEN CITRATE 20 MG PO TABS: 20.0000 mg | ORAL_TABLET | Freq: Every day | ORAL | 3 refills | Status: DC

## 2018-01-20 MED ORDER — ATORVASTATIN CALCIUM 80 MG PO TABS: 80.0000 mg | ORAL_TABLET | Freq: Every day | ORAL | 3 refills | Status: DC

## 2018-01-20 MED ORDER — SERTRALINE HCL 50 MG PO TABS: 50.0000 mg | ORAL_TABLET | Freq: Every day | ORAL | 1 refills | Status: DC

## 2018-01-20 MED ORDER — HYDROCHLOROTHIAZIDE 12.5 MG PO TABS: 12.5000 mg | ORAL_TABLET | Freq: Every day | ORAL | 0 refills | Status: DC

## 2018-01-20 MED ORDER — AMLODIPINE BESYLATE 10 MG PO TABS: 10.0000 mg | ORAL_TABLET | Freq: Every day | ORAL | 1 refills | Status: DC

## 2018-01-20 MED ORDER — PANTOPRAZOLE SODIUM 40 MG PO TBEC: 40.0000 mg | DELAYED_RELEASE_TABLET | Freq: Every day | ORAL | 0 refills | Status: DC

## 2018-01-20 MED ORDER — RAMELTEON 8 MG PO TABS: 8.0000 mg | ORAL_TABLET | Freq: Every evening | ORAL | 2 refills | Status: DC | PRN

## 2018-01-20 NOTE — Assessment & Plan Note (Addendum)
HTN: Slight headache today. BP 173/75. Denied other symptoms including chest pain, palpitations, nausea, vomiting, weakness, dizziness, headache or muscle aches.  Plan: Continue Amlodipine Initiate therapy with HCTZ 12.5mg  daily today Patient will have routine lab work completed by her Oncologist. Will need to follow this for electrolytes since starting HCTZ today. I am attempting to avoid overburdening the patient with appointment today.

## 2018-01-20 NOTE — Assessment & Plan Note (Signed)
  Insomnia: We will stop the Xanax and order Ramelteon today

## 2018-01-20 NOTE — Progress Notes (Signed)
   CC: Routine visit for HTN and HHT  HPI:Peggy House is a 57 y.o. female who presents for evaluation of HHT and HTN. Please see individual problem based A/P for details.  Hereditary Hemorrhagic Telangiectasia (HHT): Upper GI Bleed: Iron deficiency anemia: Has not had an episode of bleeding since her discharge from [facility] near mid September. CBC stable at 11.1 as of October 4th. Follows with the NP [employer]. Recent lab work indicating iron deficiency anemia but stable Hgb at 11.1. Remains asymptomatic.   Plan: Currently on Bevacizumab or (Avastin) a VEGF inhibitor via the [facility] center. Agree with continuing this. She has follow-up with them in two weeks as per the NP's note.  Continue Pantoprazole 40mg  Daily Continue daily iron tablets, I refilled the prior script at the patients request but would consider an alternative dosing regimen from the TID at her next visit.  Continue Tamoxifen 20mg  daily  HTN: Slight headache today. BP 173/75. Denied other symptoms including chest pain, palpitations, nausea, vomiting, weakness, dizziness, headache or muscle aches.  Plan: Continue Amlodipine Initiate therapy with HCTZ 12.5mg  daily today Patient will have routine lab work completed by her Oncologist. Will need to follow this for electrolytes since starting HCTZ today. I am attempting to avoid overburdening the patient with appointment today.   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 Patient endorses anxiety associated with her chronic illness and bleeding disorder. Also endorses anxiety daily with nonspecific concerns such as local and statewide events.  Plan: Perform GAD-7 eval at next visit, patient opted to forgo at this visit as she had completed the PHQ-9.   Insomnia: We will stop the Xanax and order Ramelteon today  Need for Influenza Vaccination: Patient has opted to forgo the influenza vaccine today despite a thorough explanation of  the benefits of such.   PHQ-9: Based on the patients    Office Visit from 01/20/2018 in [facility]  PHQ-9 Total Score  13     score we have decided to reinitiate therapy with sertraline 50mg  daily. Would titrate this as indicated.  Past Medical History:  Diagnosis Date  . Anxiety   . Arthritis    knnes,back  . GERD (gastroesophageal reflux disease)   . History of swelling of feet   . Hyperlipidemia   . Hypertension   . Major depressive disorder, recurrent episode ([place]) 06/05/2015  . Obesity   . Snores    Review of Systems:  ROS negative except as per HPI.  Physical Exam: Vitals:   01/20/18 1516  BP: (!) 173/75  Pulse: 64  Temp: 97.8 F (36.6 C)  TempSrc: Oral  SpO2: 100%  Weight: 253 lb 9.6 oz (115 kg)   General: A/O x4, in no acute distress,  HEENT: PERRL, EOM intact Cardio: RRR, GIII murmur noted most at the RUSB Pulmonary: CTA bilaterally, no wheezing rhonchi or rails MSK: Bilateral lower extremities with mild pitting edema  Assessment & Plan:   See Encounters Tab for problem based charting.  Patient discussed with Dr. Evette Doffing

## 2018-01-20 NOTE — Assessment & Plan Note (Signed)
Need for Influenza Vaccination: Patient has opted to forgo the influenza vaccine today despite a thorough explanation of the benefits of such

## 2018-01-20 NOTE — Assessment & Plan Note (Signed)
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 Patient endorses anxiety associated with her chronic illness and bleeding disorder. Also endorses anxiety daily with nonspecific concerns such as local and statewide events.  Plan: Perform GAD-7 eval at next visit, patient opted to forgo at this visit as she had completed the PHQ-9.

## 2018-01-20 NOTE — Assessment & Plan Note (Signed)
Hereditary Hemorrhagic Telangiectasia (HHT): Upper GI Bleed: Iron deficiency anemia: Has not had an episode of bleeding since her discharge from [facility] near mid September. CBC stable at 11.1 as of October 4th. Follows with the NP [employer]. Recent lab work indicating iron deficiency anemia but stable Hgb at 11.1. Remains asymptomatic.   Plan: Currently on Bevacizumab or (Avastin) a VEGF inhibitor via the [facility] [facility] center. Agree with continuing this. She has follow-up with them in two weeks as per the NP's note.  Continue Pantoprazole 40mg  Daily Continue daily iron tablets, I refilled the prior script at the patients request but would consider an alternative dosing regimen from the TID at her next visit.  Continue Tamoxifen 20mg  daily

## 2018-01-20 NOTE — Patient Instructions (Signed)
FOLLOW-UP INSTRUCTIONS When: 3-4 months For: Routine Visit What to bring: All of your medications  I have refilled all of your medications today. Please call us with any questions.   As always if you develop symptoms of anemia such as weakness, blood loss, vomiting blood, bloody diarrhea or other please notify our office or visit the local ER if we are unavailable.   Thank you for your visit to the [facility] today.

## 2018-01-20 NOTE — Assessment & Plan Note (Signed)
See HHT

## 2018-01-21 NOTE — Progress Notes (Signed)
Internal Medicine Clinic Attending  Case discussed with Dr. Harbrecht at the time of the visit.  We reviewed the resident's history and exam and pertinent patient test results.  I agree with the assessment, diagnosis, and plan of care documented in the resident's note.   

## 2018-01-22 ENCOUNTER — Other Ambulatory Visit: Payer: Self-pay

## 2018-01-22 NOTE — Telephone Encounter (Signed)
  Pt states ramelteon (ROZEREM) 8 MG tablet is not covered by insurance. Please call pt back.

## 2018-01-22 NOTE — Telephone Encounter (Signed)
Information was sent to [place] for PA for Ramelteon.  Awaiting determination from [employer].  Sander Nephew, RN 01/22/2018 4:20 PM.

## 2018-01-23 ENCOUNTER — Other Ambulatory Visit: Payer: Medicaid Other

## 2018-01-23 ENCOUNTER — Ambulatory Visit: Payer: Medicaid Other

## 2018-01-26 ENCOUNTER — Telehealth: Payer: Self-pay | Admitting: *Deleted

## 2018-01-26 ENCOUNTER — Telehealth: Payer: Self-pay | Admitting: Internal Medicine

## 2018-01-26 NOTE — Telephone Encounter (Signed)
Information was faxed to [employer] for PA for Ramelton on 01/22/2018.  Awaiting determination.  Sander Nephew, RN 01/22/2018 3:15 PM.  Call to [place] to check on PA request.  Pending. Sander Nephew, RN  01/23/2018 4:11 PM.    RTC to [place] denial for Ramelton.  Patient will need to try and fail Zolpiedem 5 or 10 mg  or Florzepam 15mg  or  30 mg. Message to be sent to Dr. Berline Lopes.  Sander Nephew, RN 01/26/2018 4:16 PM.

## 2018-01-26 NOTE — Telephone Encounter (Signed)
Pt called to states Medicaid did not approve her  Refill Request for the following medication.  [facility] [place], [place] AT [address]  Pt wants to know if she get get another medication that could be approved.

## 2018-01-29 ENCOUNTER — Telehealth: Payer: Self-pay | Admitting: Internal Medicine

## 2018-01-29 NOTE — Telephone Encounter (Addendum)
Patient states she is aware that medicaid will not cover ramelteon. Would like different medication that is covered. Please see phone note from 01/26/2018. Hubbard Hartshorn, RN, BSN

## 2018-01-29 NOTE — Telephone Encounter (Signed)
Pls call pt regarding medication change, pt contact [phone]

## 2018-01-30 NOTE — Telephone Encounter (Signed)
Pt is calling back to speak with a nurse. Please call back.  

## 2018-01-30 NOTE — Telephone Encounter (Signed)
That is disappointing.  Unfortunately, I am not comfortable starting one of the other suggested more habit forming sleep aids. Ramelteon is a much better option in her case. I would suggest using melatonin at 5mg  nightly approximately 2 hours before bed for a minimum of a 2 week trial. Otherwise, she will need to discuss this further with her PCP as she will need a more thorough evaluation and additional teaching regarding appropriate sleep hygiene as well.   Thank you.

## 2018-01-30 NOTE — Telephone Encounter (Signed)
Patient notified of below. Will try 2 week trial of melatonin and call back to schedule appt with PCP if no improvement. Hubbard Hartshorn, RN, BSN

## 2018-01-31 NOTE — Telephone Encounter (Signed)
Thank you :)

## 2018-02-02 NOTE — Progress Notes (Signed)
[name]  [phone] Fax:(336) [phone]  Clinic Follow up Note   Patient Care Team: [name], MD as PCP - General (Internal Medicine)   Date of Service:  02/06/2018  Oncological/hematological History (per Dr. Lebron Conners 11/28/2016): Peggy House a 57 y.o. female referred to the [facility] for evaluation of leukocytosis. Leukocytosis has been persistent, chronic and dating back to at least 2009. Concurrently, patient has been having mild to severe anemia associated with recurrent epistaxis appearance on a daily basis hour. 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 Last episode of severe upper GI bleeding appears to have occurred in January 2018. Patient reports a family history of recurrent nosebleeds in her aunt great-aunt with previous history of gastric ulcers. Patient has not had any ENT assessment so far for the epistaxis. She also has not Her appointments with gastroenterology to recheck itself the gastric ulcers or to have a colonoscopy since January 2018.  At this time, she continues to have fatigue, her symptoms are dominated by depressive symptoms that she attributes to 7 years ago. Patient has poor sleep with difficulties going to sleep due to perseveration. She has recently been Prozac, but does not appear to have significant benefit in terms of her mood yet. It appears that multiple psychosocial stressors are contributing to her depression at this time. Patient does not have very good coping mechanisms based in our conversation today. Physically, she denies any chest pain, shortness of breath, or cough at this time. Denies active abdominal discomfort, diarrhea, or melena. Denies night sweats, easy bruisability, or swollen glands in the neck, armpits, or groin.   PREVIOUS AND CURRENT THERAPY 1.  Blood transfusion for severe anemia secondary to GI bleeding, > 20 units so far  2. Multiple EGD,  small bowel enteroscope, C-scope with APC 3. Oral and iv amicar were given during hospital stay, no effect  4. Tamoxifen started in 10/2017 for HHT 6. Avastin on 8/2 and 8/30 at [facility]; starting 12/26/17 continue 5 mg/kg q2 weeks until 02/20/18.  7. 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 She was previously seen by me 11 months ago. In interim she was hospitalized several times for severe GI Bleeding due to AVMs. She was seen by NP Regan Rakers and NP Cyril Mourning in interim after latest hospitalization.  She presents to the clinic today accompanied by a family member.  She notes since she left the hospital she only has been having epistaxis. She denies any other bleeding. She notes she is tolerating Avastin. She notes she is also tolerating Tamoxifen well. She denies hot flashes.      REVIEW OF SYSTEMS:   Constitutional: Denies fevers, chills or abnormal weight loss Eyes: Denies blurriness of vision Ears, nose, mouth, throat, and face: Denies mucositis or sore throat (+) epistaxis  Respiratory: Denies dyspnea or wheezes   Cardiovascular: Denies palpitation, chest discomfort or lower extremity swelling Gastrointestinal:  Denies nausea, heartburn or change in bowel habits Skin: Denies abnormal skin rashes Lymphatics: Denies new lymphadenopathy or easy bruising Neurological:Denies numbness, tingling or new weaknesses Behavioral/Psych: Mood is stable, no new changes  All other systems were reviewed with the patient and are negative.  MEDICAL HISTORY:  Past Medical History:  Diagnosis  Date  . Anxiety   . Arthritis    knnes,back  . GERD (gastroesophageal reflux disease)   . History of swelling of feet   . Hyperlipidemia   . Hypertension   . Major depressive disorder, recurrent  episode ([place]) 06/05/2015  . Obesity   . Snores     SURGICAL HISTORY: Past Surgical History:  Procedure Laterality Date  . ABDOMINAL HYSTERECTOMY    . CARPAL TUNNEL RELEASE  05/13/2011   Procedure: CARPAL TUNNEL RELEASE;  Surgeon: Nita Sells, MD;  Location: [place];  Service: Orthopedics;  Laterality: Left;  . COLONOSCOPY WITH PROPOFOL N/A 04/28/2014   Procedure: COLONOSCOPY WITH PROPOFOL;  Surgeon: Cleotis Nipper, MD;  Location: [facility] ENDOSCOPY;  Service: Endoscopy;  Laterality: N/A;  . DG TOES*L*  2/10   rt  . DILATION AND CURETTAGE OF UTERUS    . ENTEROSCOPY N/A 10/17/2017   Procedure: ENTEROSCOPY;  Surgeon: Otis Brace, MD;  Location: [place] ENDOSCOPY;  Service: Gastroenterology;  Laterality: N/A;  . ESOPHAGOGASTRODUODENOSCOPY N/A 04/10/2014   Procedure: ESOPHAGOGASTRODUODENOSCOPY (EGD);  Surgeon: [name], MD;  Location: [facility] ENDOSCOPY;  Service: Endoscopy;  Laterality: N/A;  . ESOPHAGOGASTRODUODENOSCOPY N/A 05/10/2017   Procedure: ESOPHAGOGASTRODUODENOSCOPY (EGD);  Surgeon: Ronald Lobo, MD;  Location: [facility] ENDOSCOPY;  Service: Endoscopy;  Laterality: N/A;  . ESOPHAGOGASTRODUODENOSCOPY N/A 09/22/2017   Procedure: ESOPHAGOGASTRODUODENOSCOPY (EGD);  Surgeon: Clarene Essex, MD;  Location: [place];  Service: Endoscopy;  Laterality: N/A;  bedside  . ESOPHAGOGASTRODUODENOSCOPY (EGD) WITH PROPOFOL N/A 04/27/2014   Procedure: ESOPHAGOGASTRODUODENOSCOPY (EGD) WITH PROPOFOL;  Surgeon: Cleotis Nipper, MD;  Location: [place];  Service: Endoscopy;  Laterality: N/A;  possible apc  . ESOPHAGOGASTRODUODENOSCOPY (EGD) WITH PROPOFOL N/A 09/30/2017   Procedure: ESOPHAGOGASTRODUODENOSCOPY (EGD) WITH PROPOFOL;  Surgeon: [name], MD;  Location: [place];  Service: Gastroenterology;  Laterality: N/A;  . ESOPHAGOGASTRODUODENOSCOPY (EGD) WITH PROPOFOL N/A 10/01/2017   Procedure: ESOPHAGOGASTRODUODENOSCOPY (EGD) WITH PROPOFOL;  Surgeon: [name], MD;   Location: [place];  Service: Gastroenterology;  Laterality: N/A;  . ESOPHAGOGASTRODUODENOSCOPY (EGD) WITH PROPOFOL N/A 10/08/2017   Procedure: ESOPHAGOGASTRODUODENOSCOPY (EGD) WITH PROPOFOL;  Surgeon: Otis Brace, MD;  Location: [facility] ENDOSCOPY;  Service: Gastroenterology;  Laterality: N/A;  . ESOPHAGOGASTRODUODENOSCOPY (EGD) WITH PROPOFOL N/A 10/17/2017   Procedure: ESOPHAGOGASTRODUODENOSCOPY (EGD) WITH PROPOFOL;  Surgeon: Otis Brace, MD;  Location: [facility] ENDOSCOPY;  Service: Gastroenterology;  Laterality: N/A;  . ESOPHAGOGASTRODUODENOSCOPY (EGD) WITH PROPOFOL N/A 10/19/2017   Procedure: ESOPHAGOGASTRODUODENOSCOPY (EGD) WITH PROPOFOL;  Surgeon: Otis Brace, MD;  Location: [facility] ENDOSCOPY;  Service: Gastroenterology;  Laterality: N/A;  . GIVENS CAPSULE STUDY N/A 10/02/2017   Procedure: GIVENS CAPSULE STUDY;  Surgeon: [name], MD;  Location: [place];  Service: Gastroenterology;  Laterality: N/A;  . GIVENS CAPSULE STUDY N/A 10/08/2017   Procedure: GIVENS CAPSULE STUDY;  Surgeon: Otis Brace, MD;  Location: [place];  Service: Gastroenterology;  Laterality: N/A;  endoscopic placement of capsule  . HOT HEMOSTASIS N/A 04/27/2014   Procedure: HOT HEMOSTASIS (ARGON PLASMA COAGULATION/BICAP);  Surgeon: Cleotis Nipper, MD;  Location: [facility] ENDOSCOPY;  Service: Endoscopy;  Laterality: N/A;  . HOT HEMOSTASIS N/A 09/30/2017   Procedure: HOT HEMOSTASIS (ARGON PLASMA COAGULATION/BICAP);  Surgeon: [name], MD;  Location: [place];  Service: Gastroenterology;  Laterality: N/A;  . HOT HEMOSTASIS N/A 10/01/2017   Procedure: HOT HEMOSTASIS (ARGON PLASMA COAGULATION/BICAP);  Surgeon: [name], MD;  Location: [place];  Service: Gastroenterology;  Laterality: N/A;  . HOT HEMOSTASIS N/A 10/17/2017   Procedure: HOT HEMOSTASIS (ARGON PLASMA COAGULATION/BICAP);  Surgeon: Otis Brace, MD;  Location: [facility] ENDOSCOPY;  Service: Gastroenterology;  Laterality: N/A;  . HOT HEMOSTASIS N/A  10/19/2017   Procedure: HOT HEMOSTASIS (ARGON PLASMA COAGULATION/BICAP);  Surgeon: Otis Brace, MD;  Location: [facility] ENDOSCOPY;  Service: Gastroenterology;  Laterality: N/A;  . L shoulder Surgery  2011  . SUBMUCOSAL INJECTION  09/22/2017   Procedure: SUBMUCOSAL INJECTION;  Surgeon: Clarene Essex, MD;  Location: [facility] ENDOSCOPY;  Service: Endoscopy;;    I have reviewed the social history and family history with the patient and they are unchanged from previous note.  ALLERGIES:  is allergic to feraheme [ferumoxytol]; nsaids; tomato; and wasp venom.  MEDICATIONS:  Current Outpatient Medications  Medication Sig Dispense Refill  . albuterol (PROVENTIL HFA;VENTOLIN HFA) 108 (90 Base) MCG/ACT inhaler Inhale 1-2 puffs into the lungs every 6 (six) hours as needed for wheezing or shortness of breath. 1 Inhaler 3  . amLODipine (NORVASC) 10 MG tablet Take 1 tablet (10 mg total) by mouth daily. 90 tablet 1  . atorvastatin (LIPITOR) 80 MG tablet Take 1 tablet (80 mg total) by mouth daily. 90 tablet 3  . ferrous gluconate (FERGON) 324 MG tablet Take 1 tablet (324 mg total) by mouth 3 (three) times daily with meals. 90 tablet 0  . hydrochlorothiazide (HYDRODIURIL) 12.5 MG tablet Take 1 tablet (12.5 mg total) by mouth daily. 90 tablet 0  . pantoprazole (PROTONIX) 40 MG tablet Take 1 tablet (40 mg total) by mouth daily. 180 tablet 0  . potassium chloride SA (K-DUR,KLOR-CON) 20 MEQ tablet Take 1 tablet (20 mEq total) by mouth daily. 7 tablet 0  . sertraline (ZOLOFT) 50 MG tablet Take 1 tablet (50 mg total) by mouth daily. 90 tablet 1  . tamoxifen (NOLVADEX) 20 MG tablet Take 1 tablet (20 mg total) by mouth daily. 30 tablet 3   No current facility-administered medications for this visit.    Facility-Administered Medications Ordered in Other Visits  Medication Dose Route Frequency Provider Last Rate Last Dose  . bevacizumab (AVASTIN) 600 mg in sodium chloride 0.9 % 100 mL chemo infusion  600 mg Intravenous Once  Truitt Merle, MD        PHYSICAL EXAMINATION: ECOG PERFORMANCE STATUS: 1 - Symptomatic but completely ambulatory  Vitals:   02/06/18 1037  BP: 139/71  Pulse: 71  Resp: 20  Temp: 98.3 F (36.8 C)  SpO2: 100%   Filed Weights   02/06/18 1037  Weight: 257 lb 4.8 oz (116.7 kg)    GENERAL:alert, no distress and comfortable SKIN: skin color, texture, turgor are normal, no rashes or significant lesions EYES: normal, Conjunctiva are pink and non-injected, sclera clear OROPHARYNX:no exudate, no erythema and lips, buccal mucosa, and tongue normal  NECK: supple, thyroid normal size, non-tender, without nodularity LYMPH:  no palpable lymphadenopathy in the cervical, axillary or inguinal LUNGS: clear to auscultation and percussion with normal breathing effort HEART: regular rate & rhythm and no murmurs and no lower extremity edema ABDOMEN:abdomen soft, non-tender and normal bowel sounds Musculoskeletal:no cyanosis of digits and no clubbing  NEURO: alert & oriented x 3 with fluent speech, no focal motor/sensory deficits  LABORATORY DATA:  I have reviewed the data as listed CBC Latest Ref Rng & Units 02/06/2018 01/16/2018 01/02/2018  WBC 4.0 - 10.5 K/uL 11.2(H) 8.4 9.4  Hemoglobin 12.0 - 15.0 g/dL 10.4(L) 11.1(L) 10.8(L)  Hematocrit 36.0 - 46.0 % 34.6(L) 36.2 35.4  Platelets 150 - 400 K/uL 296 207 197     CMP Latest Ref Rng & Units 02/06/2018  01/16/2018 01/02/2018  Glucose 70 - 99 mg/dL 147(H) 92 144(H)  BUN 6 - 20 mg/dL 11 8 7   Creatinine 0.44 - 1.00 mg/dL 0.98 0.77 0.80  Sodium 135 - 145 mmol/L 144 145 142  Potassium 3.5 - 5.1 mmol/L 3.6 3.4(L) 3.4(L)  Chloride 98 - 111 mmol/L 109 108 108  CO2 22 - 32 mmol/L 23 25 26   Calcium 8.9 - 10.3 mg/dL 9.0 9.6 9.0  Total Protein 6.5 - 8.1 g/dL 7.3 7.7 7.5  Total Bilirubin 0.3 - 1.2 mg/dL 0.2(L) 0.5 0.3  Alkaline Phos 38 - 126 U/L 82 79 89  AST 15 - 41 U/L 10(L) 12(L) 12(L)  ALT 0 - 44 U/L 7 <6 <6     PROCEDURES   EGD By Dr. Alessandra Bevels  10/19/17 IMPRESSION - Z-line regular, 38 cm from the incisors. - Small hiatal hernia. - A few non-bleeding angioectasias in the stomach. Treated with argon plasma coagulation (APC). - Normal second portion of the duodenum. - No specimens collected.   RADIOGRAPHIC STUDIES: I have personally reviewed the radiological images as listed and agreed with the findings in the report. No results found.   ASSESSMENT & PLAN:  Peggy House is a 57 y.o. African-American female with a history of GERD, HLD, HTN and obesity.    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 Extensive workup found the pt to have [place] based on her personal and family history and recurrent AVM GI bleedings.  -She was started on Tamoxifen on 10/2017 for HHT  -She was given Avastin on 8/2 and 8/30 at [facility], starting 12/26/17 continued 5 mg/kg q2 weeks, for approx 4-6 treatments. -She underwent a IR embolization of the left Gastric and short gastric artery branch vessels without evidence of residual flow in the Dieulafoy lesion on 12/10/2017 -She has responded to the above treatment very well, no more GI bleeding since her last hospital stay in August 2019, and her hemoglobin has been stable. -She is tolerating Tamoxifen and Avastin very well.  -Labs reviewed, Hg at 10.4, WBC at 11.2, ANC at 9.1, Urine protein trace amts. Overall adequate to proceed with Avastin today, plan to give a total of 4 dose, then continue monitoring.  We discussed she may likely need a resting again in the future if she develops GI bleeding again. -Continue Tamoxifen daily  -F/u in 2 months    2. Anemia of recurrent GI bleeding and iron deficiency -Secondary to chronic epistaxis and GI Bleeding from AVM  -She was previously on ferrous sulfate  -She has had multiple endoscopy workup  in the past -We will follow her with monthly labs and IV iron as needed. -Continue Oral Fergon 1 tab TID.  -Hg at 10.4 today (02/06/18), her last iron level was normal a months ago.    3. Reactive Leukocytosis and reactive thrombocytosis  -She has mild leukocytosis with dominant neutrophils, likely reactive -Also has intermittent to moderate thrombocytosis, likely reactive to iron deficiency -Continue monitoring  -WBC at 11.2, ANC at 9.1, PLT nml at 296K today (02/06/18)  4. Chronic Epistaxis, h/o recurrent GI Bleeding -She had her latest colonoscopy and Endoscopy in 2016. Found to have AVM and peptic ulcer disease in the stomach. -Has not had episodes of blood in stool or hematemesis in 3 years. She will still periodically have nose bleeds -I recommend she continue to follow up  with her GI, Dr. Cristina Gong -Epistaxis continues but denies any other bleeding lately.   5. Smoking cessation, Cough  -She smokes about 10 cigarettes a day -I think her recent cough is related to this  -I strongly recommend she try to cut back if she cannot completely quit. I explained the health risks that smoking can cause and contribute to.  6. Cancer screening  -I recommend she is up to date with her cancer screenings. She is past due for her mammogram. -I encourage her to have screening mammogram   7. Weight loss, decreased appetite, fatigue  -Due to her weight loss, fatigue, and deconditioning I recommended PT/OT to help her gain strength. She agreed -I previously recommended she add nutrition supplements to her diet to prevent further weight loss -Improved, she has gained 7 pounds in the past 3 weeks.  8. Hypokalemia  -Her potassium was previously low, likely related to decreased po intake and poor nutrition, She was previously prescribed oral K supplement -K at 3.6 today (02/06/18)   PLAN:  -Labs reviewed and adequate for Avastin today  -Continue Tamoxifen daily  -Lab and Avastin in 2 weeks, which  will be her last planned dose  -Lab in 4, 6 and 8 weeks -F/u in 8 weeks    No problem-specific Assessment & Plan notes found for this encounter.   No orders of the defined types were placed in this encounter.  All questions were answered. The patient knows to call the clinic with any problems, questions or concerns. No barriers to learning was detected. I spent 20 minutes counseling the patient face to face. The total time spent in the appointment was 25 minutes and more than 50% was on counseling and review of test results     Truitt Merle, MD 02/06/2018   I, Joslyn Devon, am acting as scribe for Truitt Merle, MD.   I have reviewed the above documentation for accuracy and completeness, and I agree with the above.

## 2018-02-06 ENCOUNTER — Inpatient Hospital Stay: Payer: Medicaid Other

## 2018-02-06 ENCOUNTER — Encounter: Payer: Self-pay | Admitting: Hematology

## 2018-02-06 ENCOUNTER — Telehealth: Payer: Self-pay

## 2018-02-06 ENCOUNTER — Inpatient Hospital Stay (HOSPITAL_BASED_OUTPATIENT_CLINIC_OR_DEPARTMENT_OTHER): Payer: Medicaid Other | Admitting: Hematology

## 2018-02-06 VITALS — BP 139/71 | HR 71 | Temp 98.3°F | Resp 20 | Ht 65.0 in | Wt 257.3 lb

## 2018-02-06 VITALS — BP 146/75 | HR 60 | Temp 98.2°F | Resp 18

## 2018-02-06 DIAGNOSIS — I10 Essential (primary) hypertension: Secondary | ICD-10-CM

## 2018-02-06 DIAGNOSIS — I780 Hereditary hemorrhagic telangiectasia: Secondary | ICD-10-CM

## 2018-02-06 DIAGNOSIS — D473 Essential (hemorrhagic) thrombocythemia: Secondary | ICD-10-CM

## 2018-02-06 DIAGNOSIS — R630 Anorexia: Secondary | ICD-10-CM | POA: Diagnosis not present

## 2018-02-06 DIAGNOSIS — Q2733 Arteriovenous malformation of digestive system vessel: Secondary | ICD-10-CM | POA: Diagnosis not present

## 2018-02-06 DIAGNOSIS — R634 Abnormal weight loss: Secondary | ICD-10-CM

## 2018-02-06 DIAGNOSIS — D72829 Elevated white blood cell count, unspecified: Secondary | ICD-10-CM

## 2018-02-06 DIAGNOSIS — D500 Iron deficiency anemia secondary to blood loss (chronic): Secondary | ICD-10-CM | POA: Diagnosis not present

## 2018-02-06 DIAGNOSIS — Z5112 Encounter for antineoplastic immunotherapy: Secondary | ICD-10-CM | POA: Diagnosis not present

## 2018-02-06 DIAGNOSIS — E876 Hypokalemia: Secondary | ICD-10-CM

## 2018-02-06 DIAGNOSIS — R040 Epistaxis: Secondary | ICD-10-CM

## 2018-02-06 DIAGNOSIS — K922 Gastrointestinal hemorrhage, unspecified: Secondary | ICD-10-CM

## 2018-02-06 DIAGNOSIS — R5383 Other fatigue: Secondary | ICD-10-CM

## 2018-02-06 DIAGNOSIS — R05 Cough: Secondary | ICD-10-CM | POA: Diagnosis not present

## 2018-02-06 LAB — CBC WITH DIFFERENTIAL (CANCER CENTER ONLY)
ABS IMMATURE GRANULOCYTES: 0.04 10*3/uL (ref 0.00–0.07)
BASOS ABS: 0.1 10*3/uL (ref 0.0–0.1)
Basophils Relative: 1 %
EOS ABS: 0.1 10*3/uL (ref 0.0–0.5)
Eosinophils Relative: 1 %
HEMATOCRIT: 34.6 % — AB (ref 36.0–46.0)
Hemoglobin: 10.4 g/dL — ABNORMAL LOW (ref 12.0–15.0)
IMMATURE GRANULOCYTES: 0 %
Lymphocytes Relative: 12 %
Lymphs Abs: 1.4 10*3/uL (ref 0.7–4.0)
MCH: 25.8 pg — ABNORMAL LOW (ref 26.0–34.0)
MCHC: 30.1 g/dL (ref 30.0–36.0)
MCV: 85.9 fL (ref 80.0–100.0)
Monocytes Absolute: 0.5 10*3/uL (ref 0.1–1.0)
Monocytes Relative: 5 %
NEUTROS ABS: 9.1 10*3/uL — AB (ref 1.7–7.7)
NEUTROS PCT: 81 %
NRBC: 0 % (ref 0.0–0.2)
PLATELETS: 296 10*3/uL (ref 150–400)
RBC: 4.03 MIL/uL (ref 3.87–5.11)
RDW: 17.3 % — AB (ref 11.5–15.5)
WBC Count: 11.2 10*3/uL — ABNORMAL HIGH (ref 4.0–10.5)

## 2018-02-06 LAB — CMP (CANCER CENTER ONLY)
ALK PHOS: 82 U/L (ref 38–126)
ALT: 7 U/L (ref 0–44)
ANION GAP: 12 (ref 5–15)
AST: 10 U/L — ABNORMAL LOW (ref 15–41)
Albumin: 3.3 g/dL — ABNORMAL LOW (ref 3.5–5.0)
BUN: 11 mg/dL (ref 6–20)
CHLORIDE: 109 mmol/L (ref 98–111)
CO2: 23 mmol/L (ref 22–32)
Calcium: 9 mg/dL (ref 8.9–10.3)
Creatinine: 0.98 mg/dL (ref 0.44–1.00)
GFR, Estimated: >60 mL/min (ref >60)
Glucose, Bld: 147 mg/dL — ABNORMAL HIGH (ref 70–99)
POTASSIUM: 3.6 mmol/L (ref 3.5–5.1)
SODIUM: 144 mmol/L (ref 135–145)
Total Bilirubin: 0.2 mg/dL — ABNORMAL LOW (ref 0.3–1.2)
Total Protein: 7.3 g/dL (ref 6.5–8.1)

## 2018-02-06 LAB — RETICULOCYTES
Immature Retic Fract: 15 % (ref 2.3–15.9)
RBC.: 4.03 MIL/uL (ref 3.87–5.11)
RETIC COUNT ABSOLUTE: 45.5 10*3/uL (ref 19.0–186.0)
Retic Ct Pct: 1.1 % (ref 0.4–3.1)

## 2018-02-06 LAB — TOTAL PROTEIN, URINE DIPSTICK

## 2018-02-06 MED ORDER — SODIUM CHLORIDE 0.9 % IV SOLN
600.0000 mg | Freq: Once | INTRAVENOUS | Status: AC
  Administered 2018-02-06: 600 mg via INTRAVENOUS
  Filled 2018-02-06: qty 16

## 2018-02-06 MED ORDER — SODIUM CHLORIDE 0.9 % IV SOLN
Freq: Once | INTRAVENOUS | Status: AC
  Administered 2018-02-06: 12:00:00 via INTRAVENOUS
  Filled 2018-02-06: qty 250

## 2018-02-06 NOTE — Patient Instructions (Signed)
[facility] Discharge Instructions for Patients Receiving Chemotherapy  Today you received the following chemotherapy agents avastin  To help prevent nausea and vomiting after your treatment, we encourage you to take your nausea medication as directed   If you develop nausea and vomiting that is not controlled by your nausea medication, call the clinic.   BELOW ARE SYMPTOMS THAT SHOULD BE REPORTED IMMEDIATELY:  *FEVER GREATER THAN 100.5 F  *CHILLS WITH OR WITHOUT FEVER  NAUSEA AND VOMITING THAT IS NOT CONTROLLED WITH YOUR NAUSEA MEDICATION  *UNUSUAL SHORTNESS OF BREATH  *UNUSUAL BRUISING OR BLEEDING  TENDERNESS IN MOUTH AND THROAT WITH OR WITHOUT PRESENCE OF ULCERS  *URINARY PROBLEMS  *BOWEL PROBLEMS  UNUSUAL RASH Items with * indicate a potential emergency and should be followed up as soon as possible.  Feel free to call the clinic you have any questions or concerns. The clinic phone number is [phone].  

## 2018-02-06 NOTE — Telephone Encounter (Signed)
Printed avs and calender of upcoming appointment. Per 10/25 los

## 2018-02-19 ENCOUNTER — Inpatient Hospital Stay: Payer: Medicaid Other

## 2018-02-19 ENCOUNTER — Inpatient Hospital Stay: Payer: Medicaid Other | Attending: Hematology

## 2018-02-19 VITALS — BP 146/74 | HR 60 | Temp 98.7°F | Resp 18

## 2018-02-19 DIAGNOSIS — D500 Iron deficiency anemia secondary to blood loss (chronic): Secondary | ICD-10-CM

## 2018-02-19 DIAGNOSIS — I780 Hereditary hemorrhagic telangiectasia: Secondary | ICD-10-CM | POA: Diagnosis present

## 2018-02-19 DIAGNOSIS — K922 Gastrointestinal hemorrhage, unspecified: Secondary | ICD-10-CM

## 2018-02-19 DIAGNOSIS — Z5112 Encounter for antineoplastic immunotherapy: Secondary | ICD-10-CM | POA: Insufficient documentation

## 2018-02-19 LAB — IRON AND TIBC
Iron: 36 ug/dL — ABNORMAL LOW (ref 41–142)
SATURATION RATIOS: 15 % — AB (ref 21–57)
TIBC: 239 ug/dL (ref 236–444)
UIBC: 203 ug/dL (ref 120–384)

## 2018-02-19 LAB — RETICULOCYTES
Immature Retic Fract: 16.9 % — ABNORMAL HIGH (ref 2.3–15.9)
RBC.: 4.33 MIL/uL (ref 3.87–5.11)
Retic Count, Absolute: 43.7 10*3/uL (ref 19.0–186.0)
Retic Ct Pct: 1 % (ref 0.4–3.1)

## 2018-02-19 LAB — CBC WITH DIFFERENTIAL (CANCER CENTER ONLY)
Abs Immature Granulocytes: 0.02 10*3/uL (ref 0.00–0.07)
Basophils Absolute: 0.1 10*3/uL (ref 0.0–0.1)
Basophils Relative: 1 %
EOS ABS: 0.1 10*3/uL (ref 0.0–0.5)
EOS PCT: 1 %
HEMATOCRIT: 36.8 % (ref 36.0–46.0)
Hemoglobin: 11 g/dL — ABNORMAL LOW (ref 12.0–15.0)
Immature Granulocytes: 0 %
LYMPHS ABS: 1.4 10*3/uL (ref 0.7–4.0)
Lymphocytes Relative: 14 %
MCH: 25.4 pg — ABNORMAL LOW (ref 26.0–34.0)
MCHC: 29.9 g/dL — AB (ref 30.0–36.0)
MCV: 85 fL (ref 80.0–100.0)
MONOS PCT: 4 %
Monocytes Absolute: 0.4 10*3/uL (ref 0.1–1.0)
NRBC: 0 % (ref 0.0–0.2)
Neutro Abs: 8.2 10*3/uL — ABNORMAL HIGH (ref 1.7–7.7)
Neutrophils Relative %: 80 %
Platelet Count: 262 10*3/uL (ref 150–400)
RBC: 4.33 MIL/uL (ref 3.87–5.11)
RDW: 17.8 % — AB (ref 11.5–15.5)
WBC Count: 10.1 10*3/uL (ref 4.0–10.5)

## 2018-02-19 LAB — CMP (CANCER CENTER ONLY)
ALBUMIN: 3.4 g/dL — AB (ref 3.5–5.0)
ALT: 7 U/L (ref 0–44)
ANION GAP: 9 (ref 5–15)
AST: 11 U/L — ABNORMAL LOW (ref 15–41)
Alkaline Phosphatase: 86 U/L (ref 38–126)
BUN: 9 mg/dL (ref 6–20)
CHLORIDE: 109 mmol/L (ref 98–111)
CO2: 23 mmol/L (ref 22–32)
Calcium: 9.1 mg/dL (ref 8.9–10.3)
Creatinine: 0.85 mg/dL (ref 0.44–1.00)
GFR, Est AFR Am: >60 mL/min (ref >60)
GFR, Estimated: >60 mL/min (ref >60)
Glucose, Bld: 148 mg/dL — ABNORMAL HIGH (ref 70–99)
POTASSIUM: 3.7 mmol/L (ref 3.5–5.1)
Sodium: 141 mmol/L (ref 135–145)
TOTAL PROTEIN: 7.6 g/dL (ref 6.5–8.1)

## 2018-02-19 LAB — TOTAL PROTEIN, URINE DIPSTICK: PROTEIN: NEGATIVE mg/dL

## 2018-02-19 LAB — FERRITIN: FERRITIN: 58 ng/mL (ref 11–307)

## 2018-02-19 MED ORDER — SODIUM CHLORIDE 0.9 % IV SOLN
Freq: Once | INTRAVENOUS | Status: AC
  Administered 2018-02-19: 10:00:00 via INTRAVENOUS
  Filled 2018-02-19: qty 250

## 2018-02-19 MED ORDER — SODIUM CHLORIDE 0.9 % IV SOLN
5.3000 mg/kg | Freq: Once | INTRAVENOUS | Status: AC
  Administered 2018-02-19: 600 mg via INTRAVENOUS
  Filled 2018-02-19: qty 16

## 2018-02-19 NOTE — Patient Instructions (Signed)
[facility] Discharge Instructions for Patients Receiving Chemotherapy  Today you received the following chemotherapy agents; Avastin.   To help prevent nausea and vomiting after your treatment, we encourage you to take your nausea medication as directed.    If you develop nausea and vomiting that is not controlled by your nausea medication, call the clinic.   BELOW ARE SYMPTOMS THAT SHOULD BE REPORTED IMMEDIATELY:  *FEVER GREATER THAN 100.5 F  *CHILLS WITH OR WITHOUT FEVER  NAUSEA AND VOMITING THAT IS NOT CONTROLLED WITH YOUR NAUSEA MEDICATION  *UNUSUAL SHORTNESS OF BREATH  *UNUSUAL BRUISING OR BLEEDING  TENDERNESS IN MOUTH AND THROAT WITH OR WITHOUT PRESENCE OF ULCERS  *URINARY PROBLEMS  *BOWEL PROBLEMS  UNUSUAL RASH Items with * indicate a potential emergency and should be followed up as soon as possible.  Feel free to call the clinic you have any questions or concerns. The clinic phone number is [phone].    

## 2018-02-20 DIAGNOSIS — I780 Hereditary hemorrhagic telangiectasia: Secondary | ICD-10-CM | POA: Diagnosis not present

## 2018-02-20 HISTORY — DX: Hereditary hemorrhagic telangiectasia: I78.0

## 2018-02-23 ENCOUNTER — Telehealth: Payer: Self-pay | Admitting: Hematology

## 2018-02-23 NOTE — Telephone Encounter (Signed)
Spoke to pt regarding appts per 11/9 sch message

## 2018-02-24 ENCOUNTER — Telehealth: Payer: Self-pay

## 2018-02-24 NOTE — Telephone Encounter (Signed)
-----   Message from Truitt Merle, MD sent at 02/21/2018  6:48 PM EST ----- Please let pt know her iron level is slightly low, and I will set up iv iron at her next lab appointment, thanks   Truitt Merle  02/21/2018

## 2018-02-24 NOTE — Telephone Encounter (Signed)
Left voice message for patient with lab results per Dr. Burr Medico, iron level is slightly low, setting her up for IV iron on 11/22, gave patient times for lab and infusion on voice message.

## 2018-03-06 ENCOUNTER — Inpatient Hospital Stay: Payer: Medicaid Other

## 2018-03-09 ENCOUNTER — Telehealth: Payer: Self-pay | Admitting: Hematology

## 2018-03-09 NOTE — Telephone Encounter (Signed)
Called pt per 11/22 sch message - left message for patient to call back to r/s

## 2018-03-20 ENCOUNTER — Telehealth: Payer: Self-pay

## 2018-03-20 ENCOUNTER — Inpatient Hospital Stay: Payer: Medicaid Other | Attending: Hematology

## 2018-03-20 DIAGNOSIS — Q2733 Arteriovenous malformation of digestive system vessel: Secondary | ICD-10-CM | POA: Insufficient documentation

## 2018-03-20 DIAGNOSIS — I780 Hereditary hemorrhagic telangiectasia: Secondary | ICD-10-CM | POA: Diagnosis present

## 2018-03-20 DIAGNOSIS — K922 Gastrointestinal hemorrhage, unspecified: Secondary | ICD-10-CM | POA: Insufficient documentation

## 2018-03-20 DIAGNOSIS — D500 Iron deficiency anemia secondary to blood loss (chronic): Secondary | ICD-10-CM

## 2018-03-20 DIAGNOSIS — Z5112 Encounter for antineoplastic immunotherapy: Secondary | ICD-10-CM | POA: Insufficient documentation

## 2018-03-20 LAB — CMP (CANCER CENTER ONLY)
ALK PHOS: 76 U/L (ref 38–126)
ALT: <6 U/L (ref 0–44)
ANION GAP: 10 (ref 5–15)
AST: 11 U/L — AB (ref 15–41)
Albumin: 3.4 g/dL — ABNORMAL LOW (ref 3.5–5.0)
BILIRUBIN TOTAL: 0.2 mg/dL — AB (ref 0.3–1.2)
BUN: 11 mg/dL (ref 6–20)
CALCIUM: 8.8 mg/dL — AB (ref 8.9–10.3)
CO2: 25 mmol/L (ref 22–32)
Chloride: 107 mmol/L (ref 98–111)
Creatinine: 0.86 mg/dL (ref 0.44–1.00)
GFR, Est AFR Am: >60 mL/min (ref >60)
GLUCOSE: 126 mg/dL — AB (ref 70–99)
Potassium: 3.6 mmol/L (ref 3.5–5.1)
Sodium: 142 mmol/L (ref 135–145)
TOTAL PROTEIN: 7.3 g/dL (ref 6.5–8.1)

## 2018-03-20 LAB — CBC WITH DIFFERENTIAL (CANCER CENTER ONLY)
ABS IMMATURE GRANULOCYTES: 0.03 10*3/uL (ref 0.00–0.07)
BASOS ABS: 0 10*3/uL (ref 0.0–0.1)
Basophils Relative: 0 %
Eosinophils Absolute: 0.1 10*3/uL (ref 0.0–0.5)
Eosinophils Relative: 1 %
HCT: 31.1 % — ABNORMAL LOW (ref 36.0–46.0)
Hemoglobin: 9.4 g/dL — ABNORMAL LOW (ref 12.0–15.0)
IMMATURE GRANULOCYTES: 0 %
Lymphocytes Relative: 13 %
Lymphs Abs: 1.3 10*3/uL (ref 0.7–4.0)
MCH: 25 pg — ABNORMAL LOW (ref 26.0–34.0)
MCHC: 30.2 g/dL (ref 30.0–36.0)
MCV: 82.7 fL (ref 80.0–100.0)
Monocytes Absolute: 0.5 10*3/uL (ref 0.1–1.0)
Monocytes Relative: 5 %
NEUTROS ABS: 7.9 10*3/uL — AB (ref 1.7–7.7)
NEUTROS PCT: 81 %
NRBC: 0 % (ref 0.0–0.2)
Platelet Count: 285 10*3/uL (ref 150–400)
RBC: 3.76 MIL/uL — AB (ref 3.87–5.11)
RDW: 19.5 % — ABNORMAL HIGH (ref 11.5–15.5)
WBC: 9.8 10*3/uL (ref 4.0–10.5)

## 2018-03-20 LAB — RETICULOCYTES
IMMATURE RETIC FRACT: 24.3 % — AB (ref 2.3–15.9)
RBC.: 3.76 MIL/uL — AB (ref 3.87–5.11)
RETIC CT PCT: 2 % (ref 0.4–3.1)
Retic Count, Absolute: 74.4 10*3/uL (ref 19.0–186.0)

## 2018-03-20 LAB — IRON AND TIBC
Iron: 32 ug/dL — ABNORMAL LOW (ref 41–142)
Saturation Ratios: 12 % — ABNORMAL LOW (ref 21–57)
TIBC: 265 ug/dL (ref 236–444)
UIBC: 233 ug/dL (ref 120–384)

## 2018-03-20 LAB — TOTAL PROTEIN, URINE DIPSTICK: Protein, ur: NEGATIVE mg/dL

## 2018-03-20 LAB — FERRITIN: Ferritin: 36 ng/mL (ref 11–307)

## 2018-03-20 NOTE — Telephone Encounter (Signed)
Printed calender for upcoming appointment. Approved by Burr Medico RN to receive fereh. per 11/22 cancel appointment. Per 12/6 walk in

## 2018-03-22 ENCOUNTER — Other Ambulatory Visit: Payer: Self-pay | Admitting: Hematology

## 2018-03-23 ENCOUNTER — Inpatient Hospital Stay: Payer: Medicaid Other

## 2018-03-23 VITALS — BP 146/74 | HR 68 | Temp 98.4°F | Resp 18

## 2018-03-23 DIAGNOSIS — D500 Iron deficiency anemia secondary to blood loss (chronic): Secondary | ICD-10-CM

## 2018-03-23 DIAGNOSIS — Z5112 Encounter for antineoplastic immunotherapy: Secondary | ICD-10-CM | POA: Diagnosis not present

## 2018-03-23 MED ORDER — SODIUM CHLORIDE 0.9 % IV SOLN
Freq: Once | INTRAVENOUS | Status: AC
  Administered 2018-03-23: 11:00:00 via INTRAVENOUS
  Filled 2018-03-23: qty 250

## 2018-03-23 MED ORDER — SODIUM CHLORIDE 0.9 % IV SOLN
125.0000 mg | Freq: Once | INTRAVENOUS | Status: AC
  Administered 2018-03-23: 125 mg via INTRAVENOUS
  Filled 2018-03-23: qty 10

## 2018-03-23 NOTE — Progress Notes (Signed)
Patient refused to stay for 30 minute post iron observation period. Stress to patient the importance of remaining for observation in case of a reaction. Patient declined. Vital signs taken. Patient ambulatory without complaints.

## 2018-03-23 NOTE — Patient Instructions (Signed)
Sodium Ferric Gluconate Complex injection What is this medicine? SODIUM FERRIC GLUCONATE COMPLEX (SOE dee um FER ik GLOO koe nate KOM pleks) is an iron replacement. It is used with epoetin therapy to treat low iron levels in patients who are receiving hemodialysis. This medicine may be used for other purposes; ask your health care provider or pharmacist if you have questions. COMMON BRAND NAME(S): Ferrlecit, Nulecit What should I tell my health care provider before I take this medicine? They need to know if you have any of the following conditions: -anemia that is not from iron deficiency -high levels of iron in the body -an unusual or allergic reaction to iron, benzyl alcohol, other medicines, foods, dyes, or preservatives -pregnant or are trying to become pregnant -breast-feeding How should I use this medicine? This medicine is for infusion into a vein. It is given by a health care professional in a hospital or clinic setting. Talk to your pediatrician regarding the use of this medicine in children. While this drug may be prescribed for children as young as 34 years old for selected conditions, precautions do apply. Overdosage: If you think you have taken too much of this medicine contact a poison control center or emergency room at once. NOTE: This medicine is only for you. Do not share this medicine with others. What if I miss a dose? It is important not to miss your dose. Call your doctor or health care professional if you are unable to keep an appointment. What may interact with this medicine? Do not take this medicine with any of the following medications: -deferoxamine -dimercaprol -other iron products This medicine may also interact with the following medications: -chloramphenicol -deferasirox -medicine for blood pressure like enalapril This list may not describe all possible interactions. Give your health care provider a list of all the medicines, herbs, non-prescription drugs,  or dietary supplements you use. Also tell them if you smoke, drink alcohol, or use illegal drugs. Some items may interact with your medicine. What should I watch for while using this medicine? Your condition will be monitored carefully while you are receiving this medicine. Visit your doctor for check-ups as directed. What side effects may I notice from receiving this medicine? Side effects that you should report to your doctor or health care professional as soon as possible: -allergic reactions like skin rash, itching or hives, swelling of the face, lips, or tongue -breathing problems -changes in hearing -changes in vision -chills, flushing, or sweating -fast, irregular heartbeat -feeling faint or lightheaded, falls -fever, flu-like symptoms -high or low blood pressure -pain, tingling, numbness in the hands or feet -severe pain in the chest, back, flanks, or groin -swelling of the ankles, feet, hands -trouble passing urine or change in the amount of urine -unusually weak or tired Side effects that usually do not require medical attention (report to your doctor or health care professional if they continue or are bothersome): -cramps -dark colored stools -diarrhea -headache -nausea, vomiting -stomach upset This list may not describe all possible side effects. Call your doctor for medical advice about side effects. You may report side effects to FDA at 1-800-FDA-1088. Where should I keep my medicine? This drug is given in a hospital or clinic and will not be stored at home. NOTE: This sheet is a summary. It may not cover all possible information. If you have questions about this medicine, talk to your doctor, pharmacist, or health care provider.  2018 Elsevier/Gold Standard (2007-12-02 15:58:57)

## 2018-03-25 ENCOUNTER — Telehealth: Payer: Self-pay | Admitting: Hematology

## 2018-03-25 NOTE — Telephone Encounter (Signed)
R/s appt for 12/20 to add iron infusion per sch message - pt is aware of appt date and time

## 2018-04-03 ENCOUNTER — Ambulatory Visit: Payer: Medicaid Other | Admitting: Hematology

## 2018-04-03 ENCOUNTER — Other Ambulatory Visit: Payer: Self-pay

## 2018-04-03 ENCOUNTER — Other Ambulatory Visit: Payer: Self-pay | Admitting: Hematology

## 2018-04-03 ENCOUNTER — Inpatient Hospital Stay: Payer: Medicaid Other

## 2018-04-03 ENCOUNTER — Other Ambulatory Visit: Payer: Medicaid Other

## 2018-04-03 ENCOUNTER — Encounter: Payer: Self-pay | Admitting: Hematology

## 2018-04-03 ENCOUNTER — Inpatient Hospital Stay (HOSPITAL_BASED_OUTPATIENT_CLINIC_OR_DEPARTMENT_OTHER): Payer: Medicaid Other | Admitting: Hematology

## 2018-04-03 VITALS — BP 149/75 | HR 78 | Temp 98.7°F | Resp 18 | Ht 65.0 in | Wt 266.7 lb

## 2018-04-03 VITALS — BP 136/70 | HR 73 | Resp 18

## 2018-04-03 DIAGNOSIS — E785 Hyperlipidemia, unspecified: Secondary | ICD-10-CM

## 2018-04-03 DIAGNOSIS — D473 Essential (hemorrhagic) thrombocythemia: Secondary | ICD-10-CM

## 2018-04-03 DIAGNOSIS — D500 Iron deficiency anemia secondary to blood loss (chronic): Secondary | ICD-10-CM

## 2018-04-03 DIAGNOSIS — I780 Hereditary hemorrhagic telangiectasia: Secondary | ICD-10-CM

## 2018-04-03 DIAGNOSIS — F17210 Nicotine dependence, cigarettes, uncomplicated: Secondary | ICD-10-CM

## 2018-04-03 DIAGNOSIS — Z5112 Encounter for antineoplastic immunotherapy: Secondary | ICD-10-CM | POA: Diagnosis not present

## 2018-04-03 DIAGNOSIS — R040 Epistaxis: Secondary | ICD-10-CM

## 2018-04-03 DIAGNOSIS — R05 Cough: Secondary | ICD-10-CM | POA: Diagnosis not present

## 2018-04-03 DIAGNOSIS — K219 Gastro-esophageal reflux disease without esophagitis: Secondary | ICD-10-CM

## 2018-04-03 DIAGNOSIS — K922 Gastrointestinal hemorrhage, unspecified: Secondary | ICD-10-CM

## 2018-04-03 DIAGNOSIS — D72829 Elevated white blood cell count, unspecified: Secondary | ICD-10-CM | POA: Diagnosis not present

## 2018-04-03 DIAGNOSIS — I10 Essential (primary) hypertension: Secondary | ICD-10-CM | POA: Diagnosis not present

## 2018-04-03 DIAGNOSIS — Q2733 Arteriovenous malformation of digestive system vessel: Secondary | ICD-10-CM | POA: Diagnosis not present

## 2018-04-03 DIAGNOSIS — Z79811 Long term (current) use of aromatase inhibitors: Secondary | ICD-10-CM

## 2018-04-03 DIAGNOSIS — E876 Hypokalemia: Secondary | ICD-10-CM | POA: Diagnosis not present

## 2018-04-03 LAB — CBC WITH DIFFERENTIAL (CANCER CENTER ONLY)
Abs Immature Granulocytes: 0.03 10*3/uL (ref 0.00–0.07)
Basophils Absolute: 0.1 10*3/uL (ref 0.0–0.1)
Basophils Relative: 0 %
Eosinophils Absolute: 0.1 10*3/uL (ref 0.0–0.5)
Eosinophils Relative: 1 %
HEMATOCRIT: 28.8 % — AB (ref 36.0–46.0)
Hemoglobin: 8.5 g/dL — ABNORMAL LOW (ref 12.0–15.0)
Immature Granulocytes: 0 %
LYMPHS ABS: 1.3 10*3/uL (ref 0.7–4.0)
Lymphocytes Relative: 11 %
MCH: 25 pg — ABNORMAL LOW (ref 26.0–34.0)
MCHC: 29.5 g/dL — ABNORMAL LOW (ref 30.0–36.0)
MCV: 84.7 fL (ref 80.0–100.0)
Monocytes Absolute: 0.5 10*3/uL (ref 0.1–1.0)
Monocytes Relative: 4 %
Neutro Abs: 9.7 10*3/uL — ABNORMAL HIGH (ref 1.7–7.7)
Neutrophils Relative %: 84 %
Platelet Count: 305 10*3/uL (ref 150–400)
RBC: 3.4 MIL/uL — ABNORMAL LOW (ref 3.87–5.11)
RDW: 21 % — ABNORMAL HIGH (ref 11.5–15.5)
WBC Count: 11.7 10*3/uL — ABNORMAL HIGH (ref 4.0–10.5)
nRBC: 0 % (ref 0.0–0.2)

## 2018-04-03 LAB — RETICULOCYTES
Immature Retic Fract: 32.3 % — ABNORMAL HIGH (ref 2.3–15.9)
RBC.: 3.4 MIL/uL — ABNORMAL LOW (ref 3.87–5.11)
Retic Count, Absolute: 121.4 10*3/uL (ref 19.0–186.0)
Retic Ct Pct: 3.6 % — ABNORMAL HIGH (ref 0.4–3.1)

## 2018-04-03 MED ORDER — TAMOXIFEN CITRATE 20 MG PO TABS: 20.0000 mg | ORAL_TABLET | Freq: Every day | ORAL | 3 refills | Status: DC

## 2018-04-03 MED ORDER — HYDROCHLOROTHIAZIDE 12.5 MG PO TABS: 12.5000 mg | ORAL_TABLET | Freq: Every day | ORAL | 0 refills | Status: DC

## 2018-04-03 MED ORDER — ATORVASTATIN CALCIUM 80 MG PO TABS: 80.0000 mg | ORAL_TABLET | Freq: Every day | ORAL | 0 refills | Status: DC

## 2018-04-03 MED ORDER — ALPRAZOLAM 0.5 MG PO TABS: 0.5000 mg | ORAL_TABLET | Freq: Two times a day (BID) | ORAL | 1 refills | Status: DC | PRN

## 2018-04-03 MED ORDER — SODIUM CHLORIDE 0.9 % IV SOLN
Freq: Once | INTRAVENOUS | Status: AC
  Administered 2018-04-03: 15:00:00 via INTRAVENOUS
  Filled 2018-04-03: qty 250

## 2018-04-03 MED ORDER — SODIUM CHLORIDE 0.9 % IV SOLN
5.3000 mg/kg | Freq: Once | INTRAVENOUS | Status: AC
  Administered 2018-04-03: 600 mg via INTRAVENOUS
  Filled 2018-04-03: qty 8

## 2018-04-03 MED ORDER — SODIUM CHLORIDE 0.9 % IV SOLN
125.0000 mg | Freq: Once | INTRAVENOUS | Status: AC
  Administered 2018-04-03: 125 mg via INTRAVENOUS
  Filled 2018-04-03: qty 10

## 2018-04-03 MED ORDER — PANTOPRAZOLE SODIUM 40 MG PO TBEC: 40.0000 mg | DELAYED_RELEASE_TABLET | Freq: Every day | ORAL | 0 refills | Status: DC

## 2018-04-03 MED ORDER — SODIUM CHLORIDE 0.9 % IV SOLN
Freq: Once | INTRAVENOUS | Status: AC
  Administered 2018-04-03: 14:00:00 via INTRAVENOUS
  Filled 2018-04-03: qty 250

## 2018-04-03 NOTE — Patient Instructions (Signed)
Sodium Ferric Gluconate Complex injection What is this medicine? SODIUM FERRIC GLUCONATE COMPLEX (SOE dee um FER ik GLOO koe nate KOM pleks) is an iron replacement. It is used with epoetin therapy to treat low iron levels in patients who are receiving hemodialysis. This medicine may be used for other purposes; ask your health care provider or pharmacist if you have questions. COMMON BRAND NAME(S): Ferrlecit, Nulecit What should I tell my health care provider before I take this medicine? They need to know if you have any of the following conditions: -anemia that is not from iron deficiency -high levels of iron in the body -an unusual or allergic reaction to iron, benzyl alcohol, other medicines, foods, dyes, or preservatives -pregnant or are trying to become pregnant -breast-feeding How should I use this medicine? This medicine is for infusion into a vein. It is given by a health care professional in a hospital or clinic setting. Talk to your pediatrician regarding the use of this medicine in children. While this drug may be prescribed for children as young as 82 years old for selected conditions, precautions do apply. Overdosage: If you think you have taken too much of this medicine contact a poison control center or emergency room at once. NOTE: This medicine is only for you. Do not share this medicine with others. What if I miss a dose? It is important not to miss your dose. Call your doctor or health care professional if you are unable to keep an appointment. What may interact with this medicine? Do not take this medicine with any of the following medications: -deferoxamine -dimercaprol -other iron products This medicine may also interact with the following medications: -chloramphenicol -deferasirox -medicine for blood pressure like enalapril This list may not describe all possible interactions. Give your health care provider a list of all the medicines, herbs, non-prescription drugs,  or dietary supplements you use. Also tell them if you smoke, drink alcohol, or use illegal drugs. Some items may interact with your medicine. What should I watch for while using this medicine? Your condition will be monitored carefully while you are receiving this medicine. Visit your doctor for check-ups as directed. What side effects may I notice from receiving this medicine? Side effects that you should report to your doctor or health care professional as soon as possible: -allergic reactions like skin rash, itching or hives, swelling of the face, lips, or tongue -breathing problems -changes in hearing -changes in vision -chills, flushing, or sweating -fast, irregular heartbeat -feeling faint or lightheaded, falls -fever, flu-like symptoms -high or low blood pressure -pain, tingling, numbness in the hands or feet -severe pain in the chest, back, flanks, or groin -swelling of the ankles, feet, hands -trouble passing urine or change in the amount of urine -unusually weak or tired Side effects that usually do not require medical attention (report to your doctor or health care professional if they continue or are bothersome): -cramps -dark colored stools -diarrhea -headache -nausea, vomiting -stomach upset This list may not describe all possible side effects. Call your doctor for medical advice about side effects. You may report side effects to FDA at 1-800-FDA-1088. Where should I keep my medicine? This drug is given in a hospital or clinic and will not be stored at home. NOTE: This sheet is a summary. It may not cover all possible information. If you have questions about this medicine, talk to your doctor, pharmacist, or health care provider.  2018 Elsevier/Gold Standard (2007-12-02 15:58:57)  [facility] Discharge Instructions  for Patients Receiving Chemotherapy  Today you received the following chemotherapy agents: Avastin.  To help prevent nausea and vomiting  after your treatment, we encourage you to take your nausea medication as directed  If you develop nausea and vomiting that is not controlled by your nausea medication, call the clinic.   BELOW ARE SYMPTOMS THAT SHOULD BE REPORTED IMMEDIATELY:  *FEVER GREATER THAN 100.5 F  *CHILLS WITH OR WITHOUT FEVER  NAUSEA AND VOMITING THAT IS NOT CONTROLLED WITH YOUR NAUSEA MEDICATION  *UNUSUAL SHORTNESS OF BREATH  *UNUSUAL BRUISING OR BLEEDING  TENDERNESS IN MOUTH AND THROAT WITH OR WITHOUT PRESENCE OF ULCERS  *URINARY PROBLEMS  *BOWEL PROBLEMS  UNUSUAL RASH Items with * indicate a potential emergency and should be followed up as soon as possible.  Feel free to call the clinic you have any questions or concerns. The clinic phone number is [phone].

## 2018-04-03 NOTE — Progress Notes (Signed)
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 She presents to the clinic today by herself. She notes having epistaxis which is more frequent lately, nightly when she lays down and lasts for 30 minutes. She notes recurrence of very dark, but not black stool. She notes her physicians at [facility] want her to restart Avastin. She notes coughing up blood a few times.  She notes needing refill of Xanax for her anxiety. She ran out of Tamoxifen 2 days ago and I will refill today. I reviewed her medication list with her.     REVIEW OF SYSTEMS:   Constitutional: Denies fevers, chills or abnormal weight loss Eyes: Denies blurriness of vision Ears, nose, mouth, throat, and face: Denies mucositis or sore throat (+) increased epistaxis Respiratory: Denies cough, dyspnea or wheezes Cardiovascular: Denies palpitation, chest discomfort or lower extremity swelling (+) very dark stool Gastrointestinal:  Denies nausea, heartburn or change in bowel habits Skin: Denies abnormal skin rashes Lymphatics: Denies new lymphadenopathy or  easy bruising Neurological:Denies numbness, tingling or new weaknesses Behavioral/Psych: Mood is stable, no new changes (+) anxiety All other systems were reviewed with the patient and are negative.  MEDICAL HISTORY:  Past Medical History:  Diagnosis Date  . Anxiety   . Arthritis    knnes,back  . GERD (gastroesophageal reflux disease)   . History of swelling of feet   . Hyperlipidemia   . Hypertension   . Major depressive disorder, recurrent episode ([place]) 06/05/2015  . Obesity   . Snores     SURGICAL HISTORY: Past Surgical History:  Procedure Laterality Date  . ABDOMINAL HYSTERECTOMY    . CARPAL TUNNEL RELEASE  05/13/2011   Procedure: CARPAL TUNNEL RELEASE;  Surgeon: Nita Sells, MD;  Location: [place];  Service: Orthopedics;  Laterality: Left;  . COLONOSCOPY WITH PROPOFOL N/A 04/28/2014   Procedure: COLONOSCOPY WITH PROPOFOL;  Surgeon: Cleotis Nipper, MD;  Location: [facility] ENDOSCOPY;  Service: Endoscopy;  Laterality: N/A;  . DG TOES*L*  2/10   rt  . DILATION AND CURETTAGE OF UTERUS    . ENTEROSCOPY N/A 10/17/2017   Procedure: ENTEROSCOPY;  Surgeon: Otis Brace, MD;  Location: [place] ENDOSCOPY;  Service: Gastroenterology;  Laterality: N/A;  . ESOPHAGOGASTRODUODENOSCOPY N/A 04/10/2014   Procedure: ESOPHAGOGASTRODUODENOSCOPY (EGD);  Surgeon: [name], MD;  Location: [facility] ENDOSCOPY;  Service: Endoscopy;  Laterality: N/A;  . ESOPHAGOGASTRODUODENOSCOPY N/A 05/10/2017   Procedure: ESOPHAGOGASTRODUODENOSCOPY (EGD);  Surgeon: Ronald Lobo, MD;  Location: [facility] ENDOSCOPY;  Service: Endoscopy;  Laterality: N/A;  . ESOPHAGOGASTRODUODENOSCOPY N/A 09/22/2017   Procedure: ESOPHAGOGASTRODUODENOSCOPY (EGD);  Surgeon: Clarene Essex, MD;  Location: [facility] ENDOSCOPY;  Service: Endoscopy;  Laterality: N/A;  bedside  . ESOPHAGOGASTRODUODENOSCOPY (EGD) WITH PROPOFOL N/A 04/27/2014   Procedure: ESOPHAGOGASTRODUODENOSCOPY (EGD) WITH PROPOFOL;  Surgeon: Cleotis Nipper, MD;   Location: [place];  Service: Endoscopy;  Laterality: N/A;  possible apc  . ESOPHAGOGASTRODUODENOSCOPY (EGD) WITH PROPOFOL N/A 09/30/2017   Procedure: ESOPHAGOGASTRODUODENOSCOPY (EGD) WITH PROPOFOL;  Surgeon: [name], MD;  Location: [place];  Service: Gastroenterology;  Laterality: N/A;  . ESOPHAGOGASTRODUODENOSCOPY (EGD) WITH PROPOFOL N/A 10/01/2017   Procedure: ESOPHAGOGASTRODUODENOSCOPY (EGD) WITH PROPOFOL;  Surgeon: [name], MD;  Location: [place];  Service: Gastroenterology;  Laterality: N/A;  . ESOPHAGOGASTRODUODENOSCOPY (EGD) WITH PROPOFOL N/A 10/08/2017   Procedure: ESOPHAGOGASTRODUODENOSCOPY (EGD) WITH PROPOFOL;  Surgeon: Otis Brace, MD;  Location: [facility] ENDOSCOPY;  Service: Gastroenterology;  Laterality: N/A;  . ESOPHAGOGASTRODUODENOSCOPY (EGD) WITH PROPOFOL N/A 10/17/2017   Procedure: ESOPHAGOGASTRODUODENOSCOPY (EGD) WITH PROPOFOL;  Surgeon: Otis Brace, MD;  Location: [facility] ENDOSCOPY;  Service: Gastroenterology;  Laterality: N/A;  . ESOPHAGOGASTRODUODENOSCOPY (EGD) WITH PROPOFOL N/A 10/19/2017   Procedure: ESOPHAGOGASTRODUODENOSCOPY (EGD) WITH PROPOFOL;  Surgeon: Otis Brace, MD;  Location: [facility] ENDOSCOPY;  Service: Gastroenterology;  Laterality: N/A;  . GIVENS CAPSULE STUDY N/A 10/02/2017   Procedure: GIVENS CAPSULE STUDY;  Surgeon: [name], MD;  Location: [place];  Service: Gastroenterology;  Laterality: N/A;  . GIVENS CAPSULE STUDY N/A 10/08/2017   Procedure: GIVENS CAPSULE STUDY;  Surgeon: Otis Brace, MD;  Location: [place];  Service: Gastroenterology;  Laterality: N/A;  endoscopic placement of capsule  . HOT HEMOSTASIS N/A 04/27/2014   Procedure: HOT HEMOSTASIS (ARGON PLASMA COAGULATION/BICAP);  Surgeon: Cleotis Nipper, MD;  Location: [facility] ENDOSCOPY;  Service: Endoscopy;  Laterality: N/A;  . HOT HEMOSTASIS N/A 09/30/2017   Procedure: HOT HEMOSTASIS (ARGON PLASMA COAGULATION/BICAP);  Surgeon: [name], MD;  Location: [place];   Service: Gastroenterology;  Laterality: N/A;  . HOT HEMOSTASIS N/A 10/01/2017   Procedure: HOT HEMOSTASIS (ARGON PLASMA COAGULATION/BICAP);  Surgeon: [name], MD;  Location: [place];  Service: Gastroenterology;  Laterality: N/A;  . HOT HEMOSTASIS N/A 10/17/2017   Procedure: HOT HEMOSTASIS (ARGON PLASMA COAGULATION/BICAP);  Surgeon: Otis Brace, MD;  Location: [facility] ENDOSCOPY;  Service: Gastroenterology;  Laterality: N/A;  . HOT HEMOSTASIS N/A 10/19/2017   Procedure: HOT HEMOSTASIS (ARGON PLASMA COAGULATION/BICAP);  Surgeon: Otis Brace, MD;  Location: [facility] ENDOSCOPY;  Service: Gastroenterology;  Laterality: N/A;  . L shoulder Surgery  2011  . SUBMUCOSAL INJECTION  09/22/2017   Procedure: SUBMUCOSAL INJECTION;  Surgeon: Clarene Essex, MD;  Location: [facility] ENDOSCOPY;  Service: Endoscopy;;    I have reviewed the social history and family history with the patient and they are unchanged from previous note.  ALLERGIES:  is allergic to feraheme [ferumoxytol]; nsaids; tomato; and wasp venom.  MEDICATIONS:  Current Outpatient Medications  Medication Sig Dispense Refill  . albuterol (PROVENTIL HFA;VENTOLIN HFA) 108 (90 Base) MCG/ACT inhaler Inhale 1-2 puffs into the lungs every 6 (six) hours as needed for wheezing or shortness of breath. 1 Inhaler 3  . amLODipine (NORVASC) 10 MG tablet Take 1 tablet (10 mg total) by mouth daily. 90 tablet 1  . atorvastatin (LIPITOR) 80 MG tablet Take 1 tablet (80 mg total) by mouth daily. 30 tablet 0  . hydrochlorothiazide (HYDRODIURIL) 12.5 MG tablet Take 1 tablet (12.5 mg total) by mouth daily. 30 tablet 0  . pantoprazole (PROTONIX) 40 MG tablet Take 1 tablet (40 mg total) by mouth daily. 30 tablet 0  . potassium chloride SA (K-DUR,KLOR-CON) 20 MEQ tablet Take 1 tablet (  20 mEq total) by mouth daily. 7 tablet 0  . sertraline (ZOLOFT) 50 MG tablet Take 1 tablet (50 mg total) by mouth daily. 90 tablet 1  . tamoxifen (NOLVADEX) 20 MG tablet Take 1 tablet (20 mg  total) by mouth daily. 90 tablet 3  . ALPRAZolam (XANAX) 0.5 MG tablet Take 1 tablet (0.5 mg total) by mouth 2 (two) times daily as needed for anxiety. 60 tablet 1  . ferrous gluconate (FERGON) 324 MG tablet Take 1 tablet (324 mg total) by mouth 3 (three) times daily with meals. 90 tablet 0   No current facility-administered medications for this visit.     PHYSICAL EXAMINATION: ECOG PERFORMANCE STATUS: 1 - Symptomatic but completely ambulatory  Vitals:   04/03/18 1310  BP: (!) 149/75  Pulse: 78  Resp: 18  Temp: 98.7 F (37.1 C)  SpO2: 100%   Filed Weights   04/03/18 1310  Weight: 266 lb 11.2 oz (121 kg)    GENERAL:alert, no distress and comfortable SKIN: skin color, texture, turgor are normal, no rashes or significant lesions EYES: normal, Conjunctiva are pink and non-injected, sclera clear OROPHARYNX:no exudate, no erythema and lips, buccal mucosa, and tongue normal  NECK: supple, thyroid normal size, non-tender, without nodularity LYMPH:  no palpable lymphadenopathy in the cervical, axillary or inguinal LUNGS: clear to auscultation and percussion with normal breathing effort HEART: regular rate & rhythm and no murmurs and no lower extremity edema ABDOMEN:abdomen soft, non-tender and normal bowel sounds Musculoskeletal:no cyanosis of digits and no clubbing  NEURO: alert & oriented x 3 with fluent speech, no focal motor/sensory deficits  LABORATORY DATA:  I have reviewed the data as listed CBC Latest Ref Rng & Units 04/03/2018 03/20/2018 02/19/2018  WBC 4.0 - 10.5 K/uL 11.7(H) 9.8 10.1  Hemoglobin 12.0 - 15.0 g/dL 8.5(L) 9.4(L) 11.0(L)  Hematocrit 36.0 - 46.0 % 28.8(L) 31.1(L) 36.8  Platelets 150 - 400 K/uL 305 285 262     CMP Latest Ref Rng & Units 03/20/2018 02/19/2018 02/06/2018  Glucose 70 - 99 mg/dL 126(H) 148(H) 147(H)  BUN 6 - 20 mg/dL 11 9 11   Creatinine 0.44 - 1.00 mg/dL 0.86 0.85 0.98  Sodium 135 - 145 mmol/L 142 141 144  Potassium 3.5 - 5.1 mmol/L 3.6 3.7 3.6   Chloride 98 - 111 mmol/L 107 109 109  CO2 22 - 32 mmol/L 25 23 23   Calcium 8.9 - 10.3 mg/dL 8.8(L) 9.1 9.0  Total Protein 6.5 - 8.1 g/dL 7.3 7.6 7.3  Total Bilirubin 0.3 - 1.2 mg/dL 0.2(L) <0.2(L) 0.2(L)  Alkaline Phos 38 - 126 U/L 76 86 82  AST 15 - 41 U/L 11(L) 11(L) 10(L)  ALT 0 - 44 U/L 6 7 7       RADIOGRAPHIC STUDIES: I have personally reviewed the radiological images as listed and agreed with the findings in the report. No results found.   ASSESSMENT & PLAN:  Peggy House is a 57 y.o. 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  -She is s/p IR embolization of the left Gastric and short gastric artery branch vessels without evidence of residual flow in the Dieulafoy lesionon 12/10/2017.  -She received Avastin on 8/2 and 8/30 at [facility] and then every 2  weeks 12/26/17-02/20/18 -She is currently on Tamoxifen since 10/2017 for HHT  -She has responded to the above treatment very well, no more GI bleeding, and has not need blood transfusion since her hospital discharge in 12/2017.  -Her epistaxis has increased and her stool appears to be melania again since a few days ago. Labs reveiwed, Hg at 8.5, HCT at 28.8, which has dropped  -Today I will given her IV iron and restart Avastin every 2 weeks. When her levels stabilize I will consider reducing her to Avastin every 3-4 weeks. Will monitor her labs very closely.  -Continue Tamoxifen daily. I refilled today     2. 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 -Continue Oral Fergon 1 tab TID.  -Hg dropped to  8.5 today (04/03/18). Will give IV iron and restart Avastin today. Will check labs next week to see if she needs RBCs.    3. Reactive Leukocytosis and reactive thrombocytosis  -She has mild leukocytosis with dominant neutrophils, likely reactive -Also has intermittent to moderate thrombocytosis, likely reactive to iron deficiency. Resolved lately.  -WBC at 11.7, ANC at 9.7 today (04/03/18)  4. 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 -I recommend she continue to follow up with her GI, Dr. Cristina Gong -Epistaxis has increased to nightly and notes dark stool but denies any other bleeding lately.   5. Smoking cessation, Cough  -She smokes about 10 cigarettes a day  -I think her recent cough is related to this  -I strongly recommend she try to cut back if she cannot completely quit. I explained the health risks that smoking can cause and contribute to.   6. Weight loss, decreased appetite, fatigue  -overall much improved lately  -She has been gaining weight lately   7. Hypokalemia  -likely related to decreased po intake and poor nutrition -On oral potassium, K 3.6 2 weeks ago   PLAN:  -I refilled her Xanax, Tamoxifen, Lipitor, HCTZ and Protonix today  -Labs reveiwed, hg has dropped to 8.5. Will proceed with IV iron and restart Avastin today  -Lab weekly x4 weeks, lab 12/23  -Lab, lv ferric gluconate and Avstin in 2, and 4 weeks  -F/u in 4 weeks     No problem-specific Assessment & Plan notes found for this encounter.   No orders of the defined types were placed in this encounter.  All questions were answered. The patient knows to call the clinic with any problems, questions or concerns. No barriers to learning was detected.  I spent 20 minutes counseling the patient face to face. The total time spent in the appointment was 25 minutes and more than 50% was on counseling and review  of test results     Truitt Merle, MD 04/03/2018   I, Joslyn Devon, am acting as scribe for Truitt Merle, MD.   I have reviewed the above documentation for accuracy and completeness, and I agree with the above.

## 2018-04-10 ENCOUNTER — Telehealth: Payer: Self-pay

## 2018-04-10 ENCOUNTER — Inpatient Hospital Stay: Payer: Medicaid Other

## 2018-04-10 ENCOUNTER — Other Ambulatory Visit: Payer: Self-pay | Admitting: Hematology

## 2018-04-10 ENCOUNTER — Other Ambulatory Visit: Payer: Self-pay

## 2018-04-10 DIAGNOSIS — D500 Iron deficiency anemia secondary to blood loss (chronic): Secondary | ICD-10-CM

## 2018-04-10 DIAGNOSIS — Z5112 Encounter for antineoplastic immunotherapy: Secondary | ICD-10-CM | POA: Diagnosis not present

## 2018-04-10 LAB — CBC WITH DIFFERENTIAL (CANCER CENTER ONLY)
Abs Immature Granulocytes: 0.05 10*3/uL (ref 0.00–0.07)
BASOS ABS: 0 10*3/uL (ref 0.0–0.1)
Basophils Relative: 0 %
Eosinophils Absolute: 0.1 10*3/uL (ref 0.0–0.5)
Eosinophils Relative: 1 %
HCT: 27.7 % — ABNORMAL LOW (ref 36.0–46.0)
Hemoglobin: 7.9 g/dL — ABNORMAL LOW (ref 12.0–15.0)
Immature Granulocytes: 0 %
Lymphocytes Relative: 14 %
Lymphs Abs: 1.7 10*3/uL (ref 0.7–4.0)
MCH: 24.4 pg — ABNORMAL LOW (ref 26.0–34.0)
MCHC: 28.5 g/dL — ABNORMAL LOW (ref 30.0–36.0)
MCV: 85.5 fL (ref 80.0–100.0)
Monocytes Absolute: 0.5 10*3/uL (ref 0.1–1.0)
Monocytes Relative: 5 %
NRBC: 0 % (ref 0.0–0.2)
Neutro Abs: 9.3 10*3/uL — ABNORMAL HIGH (ref 1.7–7.7)
Neutrophils Relative %: 80 %
Platelet Count: 368 10*3/uL (ref 150–400)
RBC: 3.24 MIL/uL — ABNORMAL LOW (ref 3.87–5.11)
RDW: 21.5 % — ABNORMAL HIGH (ref 11.5–15.5)
WBC Count: 11.7 10*3/uL — ABNORMAL HIGH (ref 4.0–10.5)

## 2018-04-10 LAB — SAMPLE TO BLOOD BANK

## 2018-04-10 LAB — RETICULOCYTES
IMMATURE RETIC FRACT: 27.3 % — AB (ref 2.3–15.9)
RBC.: 3.24 MIL/uL — AB (ref 3.87–5.11)
Retic Count, Absolute: 126 10*3/uL (ref 19.0–186.0)
Retic Ct Pct: 3.9 % — ABNORMAL HIGH (ref 0.4–3.1)

## 2018-04-10 NOTE — Telephone Encounter (Signed)
Left voice message for patient to inform her Hemoglobin 7.9, per Dr. Burr Medico she would like her to received 1 unit of blood tomorrow 12/28 at 8:00 am.     Orders placed, scheduled by Laurence Compton RN.  Called blood bank can prepare.

## 2018-04-11 ENCOUNTER — Inpatient Hospital Stay: Payer: Medicaid Other | Attending: Hematology

## 2018-04-11 DIAGNOSIS — Z5112 Encounter for antineoplastic immunotherapy: Secondary | ICD-10-CM | POA: Diagnosis not present

## 2018-04-11 DIAGNOSIS — D500 Iron deficiency anemia secondary to blood loss (chronic): Secondary | ICD-10-CM

## 2018-04-11 LAB — ABO/RH: ABO/RH(D): A NEG

## 2018-04-11 LAB — PREPARE RBC (CROSSMATCH)

## 2018-04-11 MED ORDER — ACETAMINOPHEN 325 MG PO TABS
650.0000 mg | ORAL_TABLET | Freq: Once | ORAL | Status: AC
  Administered 2018-04-11: 650 mg via ORAL

## 2018-04-11 MED ORDER — DIPHENHYDRAMINE HCL 25 MG PO CAPS
25.0000 mg | ORAL_CAPSULE | Freq: Once | ORAL | Status: AC
  Administered 2018-04-11: 25 mg via ORAL

## 2018-04-11 MED ORDER — DIPHENHYDRAMINE HCL 25 MG PO CAPS
ORAL_CAPSULE | ORAL | Status: AC
  Filled 2018-04-11: qty 1

## 2018-04-11 MED ORDER — ACETAMINOPHEN 325 MG PO TABS
ORAL_TABLET | ORAL | Status: AC
  Filled 2018-04-11: qty 2

## 2018-04-11 MED ORDER — SODIUM CHLORIDE 0.9% IV SOLUTION
250.0000 mL | Freq: Once | INTRAVENOUS | Status: AC
  Administered 2018-04-11: 250 mL via INTRAVENOUS
  Filled 2018-04-11: qty 250

## 2018-04-11 NOTE — Patient Instructions (Signed)
Blood Transfusion, Adult, Care After This sheet gives you information about how to care for yourself after your procedure. Your doctor may also give you more specific instructions. If you have problems or questions, contact your doctor. Follow these instructions at home:   Take over-the-counter and prescription medicines only as told by your doctor.  Go back to your normal activities as told by your doctor.  Follow instructions from your doctor about how to take care of the area where an IV tube was put into your vein (insertion site). Make sure you: ? Wash your hands with soap and water before you change your bandage (dressing). If there is no soap and water, use hand sanitizer. ? Change your bandage as told by your doctor.  Check your IV insertion site every day for signs of infection. Check for: ? More redness, swelling, or pain. ? More fluid or blood. ? Warmth. ? Pus or a bad smell. Contact a doctor if:  You have more redness, swelling, or pain around the IV insertion site.  You have more fluid or blood coming from the IV insertion site.  Your IV insertion site feels warm to the touch.  You have pus or a bad smell coming from the IV insertion site.  Your pee (urine) turns pink, red, or brown.  You feel weak after doing your normal activities. Get help right away if:  You have signs of a serious allergic or body defense (immune) system reaction, including: ? Itchiness. ? Hives. ? Trouble breathing. ? Anxiety. ? Pain in your chest or lower back. ? Fever, flushing, and chills. ? Fast pulse. ? Rash. ? Watery poop (diarrhea). ? Throwing up (vomiting). ? Dark pee. ? Serious headache. ? Dizziness. ? Stiff neck. ? Yellow color in your face or the white parts of your eyes (jaundice). Summary  After a blood transfusion, return to your normal activities as told by your doctor.  Every day, check for signs of infection where the IV tube was put into your vein.  Some  signs of infection are warm skin, more redness and pain, more fluid or blood, and pus or a bad smell where the needle went in.  Contact your doctor if you feel weak or have any unusual symptoms. This information is not intended to replace advice given to you by your health care provider. Make sure you discuss any questions you have with your health care provider. Document Released: 04/22/2014 Document Revised: 11/24/2015 Document Reviewed: 11/24/2015 Elsevier Interactive Patient Education  2019 [employer]  

## 2018-04-13 LAB — TYPE AND SCREEN
ABO/RH(D): A NEG
Antibody Screen: NEGATIVE
Unit division: 0

## 2018-04-13 LAB — BPAM RBC
Blood Product Expiration Date: 202001202359
ISSUE DATE / TIME: 201912281444
Unit Type and Rh: 600

## 2018-04-17 ENCOUNTER — Inpatient Hospital Stay: Payer: Medicaid Other | Attending: Hematology

## 2018-04-17 DIAGNOSIS — Q2733 Arteriovenous malformation of digestive system vessel: Secondary | ICD-10-CM | POA: Insufficient documentation

## 2018-04-17 DIAGNOSIS — Z5112 Encounter for antineoplastic immunotherapy: Secondary | ICD-10-CM | POA: Insufficient documentation

## 2018-04-17 DIAGNOSIS — I780 Hereditary hemorrhagic telangiectasia: Secondary | ICD-10-CM | POA: Insufficient documentation

## 2018-04-17 DIAGNOSIS — D500 Iron deficiency anemia secondary to blood loss (chronic): Secondary | ICD-10-CM | POA: Diagnosis not present

## 2018-04-17 DIAGNOSIS — K922 Gastrointestinal hemorrhage, unspecified: Secondary | ICD-10-CM | POA: Insufficient documentation

## 2018-04-17 LAB — CBC WITH DIFFERENTIAL/PLATELET
Abs Immature Granulocytes: 0.05 10*3/uL (ref 0.00–0.07)
Basophils Absolute: 0.1 10*3/uL (ref 0.0–0.1)
Basophils Relative: 0 %
EOS PCT: 1 %
Eosinophils Absolute: 0.1 10*3/uL (ref 0.0–0.5)
HCT: 31.9 % — ABNORMAL LOW (ref 36.0–46.0)
Hemoglobin: 9.2 g/dL — ABNORMAL LOW (ref 12.0–15.0)
Immature Granulocytes: 0 %
Lymphocytes Relative: 14 %
Lymphs Abs: 1.7 10*3/uL (ref 0.7–4.0)
MCH: 24.5 pg — ABNORMAL LOW (ref 26.0–34.0)
MCHC: 28.8 g/dL — ABNORMAL LOW (ref 30.0–36.0)
MCV: 85.1 fL (ref 80.0–100.0)
Monocytes Absolute: 0.7 10*3/uL (ref 0.1–1.0)
Monocytes Relative: 6 %
Neutro Abs: 9.7 10*3/uL — ABNORMAL HIGH (ref 1.7–7.7)
Neutrophils Relative %: 79 %
Platelets: 282 10*3/uL (ref 150–400)
RBC: 3.75 MIL/uL — ABNORMAL LOW (ref 3.87–5.11)
RDW: 21.1 % — ABNORMAL HIGH (ref 11.5–15.5)
WBC: 12.3 10*3/uL — ABNORMAL HIGH (ref 4.0–10.5)
nRBC: 0 % (ref 0.0–0.2)

## 2018-04-17 LAB — IRON AND TIBC
Iron: 30 ug/dL — ABNORMAL LOW (ref 41–142)
Saturation Ratios: 11 % — ABNORMAL LOW (ref 21–57)
TIBC: 286 ug/dL (ref 236–444)
UIBC: 256 ug/dL (ref 120–384)

## 2018-04-17 LAB — RETICULOCYTES
Immature Retic Fract: 22 % — ABNORMAL HIGH (ref 2.3–15.9)
RBC.: 3.75 MIL/uL — ABNORMAL LOW (ref 3.87–5.11)
RETIC CT PCT: 3.2 % — AB (ref 0.4–3.1)
Retic Count, Absolute: 119.6 10*3/uL (ref 19.0–186.0)

## 2018-04-17 LAB — FERRITIN: Ferritin: 45 ng/mL (ref 11–307)

## 2018-04-18 ENCOUNTER — Inpatient Hospital Stay: Payer: Medicaid Other

## 2018-04-18 ENCOUNTER — Other Ambulatory Visit: Payer: Self-pay | Admitting: Medical Oncology

## 2018-04-18 VITALS — BP 128/70 | HR 78 | Resp 18

## 2018-04-18 DIAGNOSIS — D500 Iron deficiency anemia secondary to blood loss (chronic): Secondary | ICD-10-CM

## 2018-04-18 DIAGNOSIS — Z5112 Encounter for antineoplastic immunotherapy: Secondary | ICD-10-CM | POA: Diagnosis not present

## 2018-04-18 DIAGNOSIS — I780 Hereditary hemorrhagic telangiectasia: Secondary | ICD-10-CM

## 2018-04-18 MED ORDER — SODIUM CHLORIDE 0.9 % IV SOLN
125.0000 mg | Freq: Once | INTRAVENOUS | Status: AC
  Administered 2018-04-18: 125 mg via INTRAVENOUS
  Filled 2018-04-18: qty 10

## 2018-04-18 MED ORDER — SODIUM CHLORIDE 0.9 % IV SOLN
Freq: Once | INTRAVENOUS | Status: AC
  Administered 2018-04-18: 13:00:00 via INTRAVENOUS
  Filled 2018-04-18: qty 250

## 2018-04-18 MED ORDER — SODIUM CHLORIDE 0.9 % IV SOLN
Freq: Once | INTRAVENOUS | Status: DC
  Filled 2018-04-18: qty 250

## 2018-04-18 MED ORDER — SODIUM CHLORIDE 0.9 % IV SOLN
5.3000 mg/kg | Freq: Once | INTRAVENOUS | Status: AC
  Administered 2018-04-18: 600 mg via INTRAVENOUS
  Filled 2018-04-18: qty 16

## 2018-04-18 NOTE — Patient Instructions (Signed)
Sodium Ferric Gluconate Complex injection What is this medicine? SODIUM FERRIC GLUCONATE COMPLEX (SOE dee um FER ik GLOO koe nate KOM pleks) is an iron replacement. It is used with epoetin therapy to treat low iron levels in patients who are receiving hemodialysis. This medicine may be used for other purposes; ask your health care provider or pharmacist if you have questions. COMMON BRAND NAME(S): Ferrlecit, Nulecit What should I tell my health care provider before I take this medicine? They need to know if you have any of the following conditions: -anemia that is not from iron deficiency -high levels of iron in the body -an unusual or allergic reaction to iron, benzyl alcohol, other medicines, foods, dyes, or preservatives -pregnant or are trying to become pregnant -breast-feeding How should I use this medicine? This medicine is for infusion into a vein. It is given by a health care professional in a hospital or clinic setting. Talk to your pediatrician regarding the use of this medicine in children. While this drug may be prescribed for children as young as 32 years old for selected conditions, precautions do apply. Overdosage: If you think you have taken too much of this medicine contact a poison control center or emergency room at once. NOTE: This medicine is only for you. Do not share this medicine with others. What if I miss a dose? It is important not to miss your dose. Call your doctor or health care professional if you are unable to keep an appointment. What may interact with this medicine? Do not take this medicine with any of the following medications: -deferoxamine -dimercaprol -other iron products This medicine may also interact with the following medications: -chloramphenicol -deferasirox -medicine for blood pressure like enalapril This list may not describe all possible interactions. Give your health care provider a list of all the medicines, herbs, non-prescription drugs,  or dietary supplements you use. Also tell them if you smoke, drink alcohol, or use illegal drugs. Some items may interact with your medicine. What should I watch for while using this medicine? Your condition will be monitored carefully while you are receiving this medicine. Visit your doctor for check-ups as directed. What side effects may I notice from receiving this medicine? Side effects that you should report to your doctor or health care professional as soon as possible: -allergic reactions like skin rash, itching or hives, swelling of the face, lips, or tongue -breathing problems -changes in hearing -changes in vision -chills, flushing, or sweating -fast, irregular heartbeat -feeling faint or lightheaded, falls -fever, flu-like symptoms -high or low blood pressure -pain, tingling, numbness in the hands or feet -severe pain in the chest, back, flanks, or groin -swelling of the ankles, feet, hands -trouble passing urine or change in the amount of urine -unusually weak or tired Side effects that usually do not require medical attention (report to your doctor or health care professional if they continue or are bothersome): -cramps -dark colored stools -diarrhea -headache -nausea, vomiting -stomach upset This list may not describe all possible side effects. Call your doctor for medical advice about side effects. You may report side effects to FDA at 1-800-FDA-1088. Where should I keep my medicine? This drug is given in a hospital or clinic and will not be stored at home. NOTE: This sheet is a summary. It may not cover all possible information. If you have questions about this medicine, talk to your doctor, pharmacist, or health care provider.  2018 Elsevier/Gold Standard (2007-12-02 15:58:57)  [facility] Discharge Instructions  for Patients Receiving Chemotherapy  Today you received the following chemotherapy agents: Avastin.  To help prevent nausea and vomiting  after your treatment, we encourage you to take your nausea medication as directed  If you develop nausea and vomiting that is not controlled by your nausea medication, call the clinic.   BELOW ARE SYMPTOMS THAT SHOULD BE REPORTED IMMEDIATELY:  *FEVER GREATER THAN 100.5 F  *CHILLS WITH OR WITHOUT FEVER  NAUSEA AND VOMITING THAT IS NOT CONTROLLED WITH YOUR NAUSEA MEDICATION  *UNUSUAL SHORTNESS OF BREATH  *UNUSUAL BRUISING OR BLEEDING  TENDERNESS IN MOUTH AND THROAT WITH OR WITHOUT PRESENCE OF ULCERS  *URINARY PROBLEMS  *BOWEL PROBLEMS  UNUSUAL RASH Items with * indicate a potential emergency and should be followed up as soon as possible.  Feel free to call the clinic you have any questions or concerns. The clinic phone number is [phone]. [place] Discharge Instructions for Patients Receiving Chemotherapy  Today you received the following chemotherapy agents Avastin.  To help prevent nausea and vomiting after your treatment, we encourage you to take your nausea medication as directed  If you develop nausea and vomiting that is not controlled by your nausea medication, call the clinic.   BELOW ARE SYMPTOMS THAT SHOULD BE REPORTED IMMEDIATELY:  *FEVER GREATER THAN 100.5 F  *CHILLS WITH OR WITHOUT FEVER  NAUSEA AND VOMITING THAT IS NOT CONTROLLED WITH YOUR NAUSEA MEDICATION  *UNUSUAL SHORTNESS OF BREATH  *UNUSUAL BRUISING OR BLEEDING  TENDERNESS IN MOUTH AND THROAT WITH OR WITHOUT PRESENCE OF ULCERS  *URINARY PROBLEMS  *BOWEL PROBLEMS  UNUSUAL RASH Items with * indicate a potential emergency and should be followed up as soon as possible.  Feel free to call the clinic you have any questions or concerns. The clinic phone number is [phone].

## 2018-04-24 ENCOUNTER — Inpatient Hospital Stay: Payer: Medicaid Other

## 2018-04-24 DIAGNOSIS — D500 Iron deficiency anemia secondary to blood loss (chronic): Secondary | ICD-10-CM

## 2018-04-24 DIAGNOSIS — Z5112 Encounter for antineoplastic immunotherapy: Secondary | ICD-10-CM | POA: Diagnosis not present

## 2018-04-24 LAB — CBC WITH DIFFERENTIAL (CANCER CENTER ONLY)
Abs Immature Granulocytes: 0.06 10*3/uL (ref 0.00–0.07)
Basophils Absolute: 0.1 10*3/uL (ref 0.0–0.1)
Basophils Relative: 0 %
Eosinophils Absolute: 0.2 10*3/uL (ref 0.0–0.5)
Eosinophils Relative: 1 %
HCT: 31 % — ABNORMAL LOW (ref 36.0–46.0)
Hemoglobin: 8.7 g/dL — ABNORMAL LOW (ref 12.0–15.0)
Immature Granulocytes: 1 %
Lymphocytes Relative: 13 %
Lymphs Abs: 1.5 10*3/uL (ref 0.7–4.0)
MCH: 24.2 pg — ABNORMAL LOW (ref 26.0–34.0)
MCHC: 28.1 g/dL — ABNORMAL LOW (ref 30.0–36.0)
MCV: 86.1 fL (ref 80.0–100.0)
Monocytes Absolute: 0.6 10*3/uL (ref 0.1–1.0)
Monocytes Relative: 5 %
Neutro Abs: 9.5 10*3/uL — ABNORMAL HIGH (ref 1.7–7.7)
Neutrophils Relative %: 80 %
Platelet Count: 318 10*3/uL (ref 150–400)
RBC: 3.6 MIL/uL — ABNORMAL LOW (ref 3.87–5.11)
RDW: 21.2 % — ABNORMAL HIGH (ref 11.5–15.5)
WBC Count: 11.9 10*3/uL — ABNORMAL HIGH (ref 4.0–10.5)
nRBC: 0 % (ref 0.0–0.2)

## 2018-04-24 LAB — RETICULOCYTES
Immature Retic Fract: 27.6 % — ABNORMAL HIGH (ref 2.3–15.9)
RBC.: 3.6 MIL/uL — ABNORMAL LOW (ref 3.87–5.11)
Retic Count, Absolute: 136.4 10*3/uL (ref 19.0–186.0)
Retic Ct Pct: 3.8 % — ABNORMAL HIGH (ref 0.4–3.1)

## 2018-04-29 NOTE — Progress Notes (Signed)
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  She reports significant nose bleeding at night for the last 3 days, which lasted for several hours.  She also has mild nose bleeding when she bents over.  Her stool has been dark brown, no melena or tarry stool.  No hematochezia.  She feels fatigued, able to tolerate routine activities at a slow pace.  She does have a home aide to help her 3 hours a day, which is covered by her Medicaid.  She lives with her husband and his son.  She denies chest pain, other bleeding, or other new symptoms.  REVIEW OF SYSTEMS:   Constitutional: Denies fevers, chills or abnormal weight loss, (+) fatigue  Eyes: Denies blurriness of vision Ears, nose, mouth, throat, and face: Denies mucositis or sore throat, (+) recurrent nose bleeding  Respiratory: Denies cough, dyspnea or wheezes Cardiovascular: Denies palpitation, chest discomfort or lower extremity swelling Gastrointestinal:  Denies nausea, heartburn or change in bowel habits Skin: Denies abnormal skin rashes Lymphatics: Denies new  lymphadenopathy or easy bruising Neurological:Denies numbness, tingling or new weaknesses Behavioral/Psych: Mood is stable, no new changes  All other systems were reviewed with the patient and are negative.  MEDICAL HISTORY:  Past Medical History:  Diagnosis Date  . Anxiety   . Arthritis    knnes,back  . GERD (gastroesophageal reflux disease)   . History of swelling of feet   . Hyperlipidemia   . Hypertension   . Major depressive disorder, recurrent episode ([place]) 06/05/2015  . Obesity   . Snores     SURGICAL HISTORY: Past Surgical History:  Procedure Laterality Date  . ABDOMINAL HYSTERECTOMY    . CARPAL TUNNEL RELEASE  05/13/2011   Procedure: CARPAL TUNNEL RELEASE;  Surgeon: Nita Sells, MD;  Location: [place];  Service: Orthopedics;  Laterality: Left;  . COLONOSCOPY WITH PROPOFOL N/A 04/28/2014   Procedure: COLONOSCOPY WITH PROPOFOL;  Surgeon: Cleotis Nipper, MD;  Location: [facility] ENDOSCOPY;  Service: Endoscopy;  Laterality: N/A;  . DG TOES*L*  2/10   rt  . DILATION AND CURETTAGE OF UTERUS    . ENTEROSCOPY N/A 10/17/2017   Procedure: ENTEROSCOPY;  Surgeon: Otis Brace, MD;  Location: [place] ENDOSCOPY;  Service: Gastroenterology;  Laterality: N/A;  . ESOPHAGOGASTRODUODENOSCOPY N/A 04/10/2014   Procedure: ESOPHAGOGASTRODUODENOSCOPY (EGD);  Surgeon: [name], MD;  Location: [facility] ENDOSCOPY;  Service: Endoscopy;  Laterality: N/A;  . ESOPHAGOGASTRODUODENOSCOPY N/A 05/10/2017   Procedure: ESOPHAGOGASTRODUODENOSCOPY (EGD);  Surgeon: Ronald Lobo, MD;  Location: [facility] ENDOSCOPY;  Service: Endoscopy;  Laterality: N/A;  . ESOPHAGOGASTRODUODENOSCOPY N/A 09/22/2017   Procedure: ESOPHAGOGASTRODUODENOSCOPY (EGD);  Surgeon: Clarene Essex,  MD;  Location: [place] ENDOSCOPY;  Service: Endoscopy;  Laterality: N/A;  bedside  . ESOPHAGOGASTRODUODENOSCOPY (EGD) WITH PROPOFOL N/A 04/27/2014   Procedure: ESOPHAGOGASTRODUODENOSCOPY (EGD) WITH PROPOFOL;  Surgeon: Cleotis Nipper, MD;  Location: [name];  Service: Endoscopy;  Laterality: N/A;  possible apc  . ESOPHAGOGASTRODUODENOSCOPY (EGD) WITH PROPOFOL N/A 09/30/2017   Procedure: ESOPHAGOGASTRODUODENOSCOPY (EGD) WITH PROPOFOL;  Surgeon: [name], MD;  Location: [place];  Service: Gastroenterology;  Laterality: N/A;  . ESOPHAGOGASTRODUODENOSCOPY (EGD) WITH PROPOFOL N/A 10/01/2017   Procedure: ESOPHAGOGASTRODUODENOSCOPY (EGD) WITH PROPOFOL;  Surgeon: [name], MD;  Location: [place];  Service: Gastroenterology;  Laterality: N/A;  . ESOPHAGOGASTRODUODENOSCOPY (EGD) WITH PROPOFOL N/A 10/08/2017   Procedure: ESOPHAGOGASTRODUODENOSCOPY (EGD) WITH PROPOFOL;  Surgeon: Otis Brace, MD;  Location: [facility] ENDOSCOPY;  Service: Gastroenterology;  Laterality: N/A;  . ESOPHAGOGASTRODUODENOSCOPY (EGD) WITH PROPOFOL N/A 10/17/2017   Procedure: ESOPHAGOGASTRODUODENOSCOPY (EGD) WITH PROPOFOL;  Surgeon: Otis Brace, MD;  Location: [facility] ENDOSCOPY;  Service: Gastroenterology;  Laterality: N/A;  . ESOPHAGOGASTRODUODENOSCOPY (EGD) WITH PROPOFOL N/A 10/19/2017   Procedure: ESOPHAGOGASTRODUODENOSCOPY (EGD) WITH PROPOFOL;  Surgeon: Otis Brace, MD;  Location: [facility] ENDOSCOPY;  Service: Gastroenterology;  Laterality: N/A;  . GIVENS CAPSULE STUDY N/A 10/02/2017   Procedure: GIVENS CAPSULE STUDY;  Surgeon: [name], MD;  Location: [place];  Service: Gastroenterology;  Laterality: N/A;  . GIVENS CAPSULE STUDY N/A 10/08/2017   Procedure: GIVENS CAPSULE STUDY;  Surgeon: Otis Brace, MD;  Location: [place];  Service: Gastroenterology;  Laterality: N/A;  endoscopic placement of capsule  . HOT HEMOSTASIS N/A 04/27/2014   Procedure: HOT HEMOSTASIS (ARGON PLASMA COAGULATION/BICAP);  Surgeon: Cleotis Nipper, MD;  Location[facility] ENDOSCOPY;  Service: Endoscopy;  Laterality: N/A;  . HOT HEMOSTASIS N/A 09/30/2017   Procedure: HOT HEMOSTASIS (ARGON PLASMA COAGULATION/BICAP);  Surgeon: [name], MD;  Location: [place];  Service: Gastroenterology;  Laterality: N/A;  . HOT HEMOSTASIS N/A 10/01/2017   Procedure: HOT HEMOSTASIS (ARGON PLASMA COAGULATION/BICAP);  Surgeon: [name], MD;  Location: [place];  Service: Gastroenterology;  Laterality: N/A;  . HOT HEMOSTASIS N/A 10/17/2017   Procedure: HOT HEMOSTASIS (ARGON PLASMA COAGULATION/BICAP);  Surgeon: Otis Brace, MD;  [facility];  Service: Gastroenterology;  Laterality: N/A;  . HOT HEMOSTASIS N/A 10/19/2017   Procedure: HOT HEMOSTASIS (ARGON PLASMA COAGULATION/BICAP);  Surgeon: Otis Brace, MD;  Location: [facility] ENDOSCOPY;  Service: Gastroenterology;  Laterality: N/A;  . L shoulder Surgery  2011  . SUBMUCOSAL INJECTION  09/22/2017   Procedure: SUBMUCOSAL INJECTION;  Surgeon: Clarene Essex, MD;  Location: [facility] ENDOSCOPY;  Service: Endoscopy;;    I have reviewed the social history and family history with the patient and they are unchanged from previous note.  ALLERGIES:  is allergic to feraheme [ferumoxytol]; nsaids; tomato; and wasp venom.  MEDICATIONS:  Current Outpatient Medications  Medication Sig Dispense Refill  . albuterol (PROVENTIL HFA;VENTOLIN HFA) 108 (90 Base) MCG/ACT inhaler Inhale 1-2 puffs into the lungs every 6 (six) hours as needed for wheezing or shortness of breath. 1 Inhaler 3  . ALPRAZolam (XANAX) 0.5 MG tablet Take 1 tablet (0.5 mg total) by mouth 2 (two) times daily as needed for anxiety. 60 tablet 1  . amLODipine (NORVASC) 10 MG tablet Take 1 tablet (10 mg total) by mouth daily. 90 tablet 0  . atorvastatin (LIPITOR) 80 MG tablet TAKE 1 TABLET(80 MG) BY MOUTH DAILY 90 tablet 1  . pantoprazole (PROTONIX) 40 MG tablet Take 1 tablet (40 mg total) by mouth daily. 30 tablet 0  . sertraline (ZOLOFT) 50  MG tablet Take 1 tablet (50 mg total) by mouth daily. 90 tablet 0  . tamoxifen (NOLVADEX) 20 MG tablet Take 1 tablet (20 mg total) by mouth daily. 90 tablet 3  . Aminocaproic Acid (AMICAR) 1000 MG TABS Take 1 tablet  (1,000 mg total) by mouth 2 (two) times daily. 60 each 1  . ferrous gluconate (FERGON) 324 MG tablet Take 1 tablet (324 mg total) by mouth 3 (three) times daily with meals. 90 tablet 0  . hydrochlorothiazide (HYDRODIURIL) 12.5 MG tablet TAKE 1 TABLET(12.5 MG) BY MOUTH DAILY 90 tablet 0   Current Facility-Administered Medications  Medication Dose Route Frequency Provider Last Rate Last Dose  . sodium chloride flush (NS) 0.9 % injection 3 mL  3 mL Intracatheter PRN Truitt Merle, MD       Facility-Administered Medications Ordered in Other Visits  Medication Dose Route Frequency Provider Last Rate Last Dose  . 0.9 %  sodium chloride infusion   Intravenous Once Truitt Merle, MD      . 0.9 %  sodium chloride infusion   Intravenous Once Truitt Merle, MD      . 0.9 %  sodium chloride infusion   Intravenous Once Truitt Merle, MD      . bevacizumab (AVASTIN) 600 mg in sodium chloride 0.9 % 100 mL chemo infusion  5.3 mg/kg (Treatment Plan Recorded) Intravenous Once Truitt Merle, MD      . ferric gluconate (NULECIT) 125 mg in sodium chloride 0.9 % 100 mL IVPB  125 mg Intravenous Once Truitt Merle, MD        PHYSICAL EXAMINATION: ECOG PERFORMANCE STATUS: 2 - Symptomatic, <50% confined to bed  Vitals:   05/01/18 1146 05/01/18 1149  BP: (!) 152/89 (!) 161/89  Pulse: 94   Resp: 17   Temp: 98.2 F (36.8 C)   SpO2: 100%    Filed Weights   05/01/18 1146  Weight: 271 lb 14.4 oz (123.3 kg)   GENERAL:alert, no distress and comfortable SKIN: skin color, texture, turgor are normal, no rashes or significant lesions EYES: normal, Conjunctiva are pink and non-injected, sclera clear OROPHARYNX:no exudate, no erythema and lips, buccal mucosa, and tongue normal  NECK: supple, thyroid normal size, non-tender, without nodularity LYMPH:  no palpable lymphadenopathy in the cervical, axillary or inguinal LUNGS: clear to auscultation and percussion with normal breathing effort HEART: regular rate & rhythm and no murmurs and no  lower extremity edema ABDOMEN:abdomen soft, non-tender and normal bowel sounds Musculoskeletal:no cyanosis of digits and no clubbing  NEURO: alert & oriented x 3 with fluent speech, no focal motor/sensory deficits  LABORATORY DATA:  I have reviewed the data as listed CBC Latest Ref Rng & Units 05/01/2018 04/24/2018 04/17/2018  WBC 4.0 - 10.5 K/uL 10.9(H) 11.9(H) 12.3(H)  Hemoglobin 12.0 - 15.0 g/dL 7.6(L) 8.7(L) 9.2(L)  Hematocrit 36.0 - 46.0 % 26.4(L) 31.0(L) 31.9(L)  Platelets 150 - 400 K/uL 295 318 282     CMP Latest Ref Rng & Units 03/20/2018 02/19/2018 02/06/2018  Glucose 70 - 99 mg/dL 126(H) 148(H) 147(H)  BUN 6 - 20 mg/dL 11 9 11   Creatinine 0.44 - 1.00 mg/dL 0.86 0.85 0.98  Sodium 135 - 145 mmol/L 142 141 144  Potassium 3.5 - 5.1 mmol/L 3.6 3.7 3.6  Chloride 98 - 111 mmol/L 107 109 109  CO2 22 - 32 mmol/L 25 23 23   Calcium 8.9 - 10.3 mg/dL 8.8(L) 9.1 9.0  Total Protein 6.5 - 8.1 g/dL 7.3 7.6 7.3  Total Bilirubin 0.3 -  1.2 mg/dL 0.2(L) <0.2(L) 0.2(L)  Alkaline Phos 38 - 126 U/L 76 86 82  AST 15 - 41 U/L 11(L) 11(L) 10(L)  ALT 0 - 44 U/L 6 7 7       RADIOGRAPHIC STUDIES: I have personally reviewed the radiological images as listed and agreed with the findings in the report. No results found.   ASSESSMENT & PLAN:  Peggy House is a 58 y.o. 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 Extensive workup found the pt to have HHT based on her personal and familyhistoryand recurrentAVM GI bleedings.  -She is s/p IR embolization of the left Gastric and short gastric artery branchvesselswithout evidence of residual flow in the Dieulafoy lesionon 12/10/2017.  -She received Avastin on 8/2 and 8/30 at [facility] and then every 2 weeks 12/26/17-02/20/18 -She is currently on Tamoxifen  since 10/2017 for HHT  -She has had recurrent GI bleeding and epistaxis again in December 2019, I have restarted Avastin -Continue Tamoxifen daily.  -I will add Amicar 1 g twice daily, to see if they will help her epistaxis and GI bleeding  -Lab reviewed, hemoglobin dropped again, will need blood transfusion tomorrow.  We will continue Avastin and IV iron today -Due to the slow GI bleeding, I am not sure repeating endoscopy or embolization would be beneficial.   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 -Continue Oral Fergon 1 tab TID. -Hemoglobin dropped to 7.6 today, will give blood tomorrow   3. Reactive Leukocytosis and reactive thrombocytosis  -She has mild leukocytosis with dominant neutrophils, likely reactive -Also has intermittent to moderate thrombocytosis, likely reactive to iron deficiency. Improved lately.  -continue monitoring   4. 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 -I recommend she continue to follow up with her GI, Dr. Cristina Gong -Epistaxis has increased lately   5. Smoking cessation, Cough  -She smokes about 10 cigarettes a day  -I think her recent cough is related to this  -I strongly recommend she try to cut back if she cannot completely quit. I explained the health risks that smoking can cause and contribute to.   6.Weight loss, decreased appetite, fatigue -overall much improved lately  -She has been gaining weight lately    PLAN:  -I refilled her amlodipine, Zoloft today, I encouraged her to call her primary care physician for refill in the future -I called in Amica 1g bid for her for her HHT related bleeding  -Labs reveiwed, hg has dropped to 7.6. Will proceed with IV iron and Avastin today  -2u PRBC tomorrow -will  schedule lab, IV ferric gluconate weekly and avastin every 2 weeks -f/u with me in 2 weeks   No problem-specific Assessment & Plan notes found for this encounter.   Orders Placed This Encounter  Procedures  . Practitioner attestation of consent    I, the ordering practitioner, attest that I have discussed with the patient the benefits, risks, side effects, alternatives, likelihood of achieving goals and potential problems during recovery for the procedure listed.    Standing Status:   Future    Standing Expiration Date:   05/01/2019    Order  Specific Question:   Procedure    Answer:   Blood Product(s)  . Complete patient signature process for consent form    Standing Status:   Future    Standing Expiration Date:   05/01/2019  . Care order/instruction    Transfuse Parameters    Standing Status:   Future    Standing Expiration Date:   05/01/2019  . Type and screen    Standing Status:   Standing    Number of Occurrences:   1  . Prepare RBC    Standing Status:   Standing    Number of Occurrences:   1    Order Specific Question:   # of Units    Answer:   2 units    Order Specific Question:   Transfusion Indications    Answer:   Symptomatic Anemia    Order Specific Question:   If emergent release call blood bank    Answer:   Not emergent release  . Sample to Blood Bank    Standing Status:   Future    Number of Occurrences:   1    Standing Expiration Date:   05/02/2019   All questions were answered. The patient knows to call the clinic with any problems, questions or concerns. No barriers to learning was detected. I spent 20 minutes counseling the patient face to face. The total time spent in the appointment was 25 minutes and more than 50% was on counseling and review of test results     Truitt Merle, MD 05/01/2018   I, Joslyn Devon, am acting as scribe for Truitt Merle, MD.   I have reviewed the above documentation for accuracy and completeness, and I agree with the above.

## 2018-04-30 ENCOUNTER — Other Ambulatory Visit: Payer: Self-pay

## 2018-04-30 ENCOUNTER — Telehealth: Payer: Self-pay

## 2018-04-30 DIAGNOSIS — D500 Iron deficiency anemia secondary to blood loss (chronic): Secondary | ICD-10-CM

## 2018-04-30 NOTE — Telephone Encounter (Signed)
Patient calls with complaint of nose bleeds at night, denies rectal bleeding, has appointment tomorrow 05/01/18 we will order T&S in case she needs blood, patient was instructed to go to ED (per Dr. Burr Medico) if begins with rectal bleeding, she verbalized an understanding and appreciated the call.

## 2018-05-01 ENCOUNTER — Inpatient Hospital Stay: Payer: Medicaid Other

## 2018-05-01 ENCOUNTER — Encounter: Payer: Self-pay | Admitting: Hematology

## 2018-05-01 ENCOUNTER — Inpatient Hospital Stay (HOSPITAL_BASED_OUTPATIENT_CLINIC_OR_DEPARTMENT_OTHER): Payer: Medicaid Other | Admitting: Hematology

## 2018-05-01 VITALS — BP 161/89 | HR 94 | Temp 98.2°F | Resp 17 | Ht 65.0 in | Wt 271.9 lb

## 2018-05-01 VITALS — BP 135/72 | HR 74 | Temp 98.5°F | Resp 17

## 2018-05-01 DIAGNOSIS — R040 Epistaxis: Secondary | ICD-10-CM

## 2018-05-01 DIAGNOSIS — I10 Essential (primary) hypertension: Secondary | ICD-10-CM

## 2018-05-01 DIAGNOSIS — D500 Iron deficiency anemia secondary to blood loss (chronic): Secondary | ICD-10-CM

## 2018-05-01 DIAGNOSIS — K922 Gastrointestinal hemorrhage, unspecified: Secondary | ICD-10-CM

## 2018-05-01 DIAGNOSIS — D473 Essential (hemorrhagic) thrombocythemia: Secondary | ICD-10-CM

## 2018-05-01 DIAGNOSIS — R5383 Other fatigue: Secondary | ICD-10-CM

## 2018-05-01 DIAGNOSIS — Z5112 Encounter for antineoplastic immunotherapy: Secondary | ICD-10-CM | POA: Diagnosis not present

## 2018-05-01 DIAGNOSIS — I780 Hereditary hemorrhagic telangiectasia: Secondary | ICD-10-CM | POA: Diagnosis not present

## 2018-05-01 DIAGNOSIS — R634 Abnormal weight loss: Secondary | ICD-10-CM

## 2018-05-01 DIAGNOSIS — R05 Cough: Secondary | ICD-10-CM

## 2018-05-01 DIAGNOSIS — F17210 Nicotine dependence, cigarettes, uncomplicated: Secondary | ICD-10-CM

## 2018-05-01 DIAGNOSIS — F411 Generalized anxiety disorder: Secondary | ICD-10-CM

## 2018-05-01 DIAGNOSIS — D72829 Elevated white blood cell count, unspecified: Secondary | ICD-10-CM

## 2018-05-01 DIAGNOSIS — R630 Anorexia: Secondary | ICD-10-CM

## 2018-05-01 LAB — CBC WITH DIFFERENTIAL (CANCER CENTER ONLY)
Abs Immature Granulocytes: 0.03 10*3/uL (ref 0.00–0.07)
Basophils Absolute: 0 10*3/uL (ref 0.0–0.1)
Basophils Relative: 0 %
Eosinophils Absolute: 0.1 10*3/uL (ref 0.0–0.5)
Eosinophils Relative: 1 %
HCT: 26.4 % — ABNORMAL LOW (ref 36.0–46.0)
Hemoglobin: 7.6 g/dL — ABNORMAL LOW (ref 12.0–15.0)
Immature Granulocytes: 0 %
LYMPHS ABS: 1.2 10*3/uL (ref 0.7–4.0)
Lymphocytes Relative: 11 %
MCH: 24.3 pg — AB (ref 26.0–34.0)
MCHC: 28.8 g/dL — ABNORMAL LOW (ref 30.0–36.0)
MCV: 84.3 fL (ref 80.0–100.0)
MONOS PCT: 5 %
Monocytes Absolute: 0.6 10*3/uL (ref 0.1–1.0)
Neutro Abs: 9 10*3/uL — ABNORMAL HIGH (ref 1.7–7.7)
Neutrophils Relative %: 83 %
Platelet Count: 295 10*3/uL (ref 150–400)
RBC: 3.13 MIL/uL — ABNORMAL LOW (ref 3.87–5.11)
RDW: 21.5 % — ABNORMAL HIGH (ref 11.5–15.5)
WBC Count: 10.9 10*3/uL — ABNORMAL HIGH (ref 4.0–10.5)
nRBC: 0 % (ref 0.0–0.2)

## 2018-05-01 LAB — RETICULOCYTES
Immature Retic Fract: 26.9 % — ABNORMAL HIGH (ref 2.3–15.9)
RBC.: 3.13 MIL/uL — ABNORMAL LOW (ref 3.87–5.11)
Retic Count, Absolute: 107 10*3/uL (ref 19.0–186.0)
Retic Ct Pct: 3.4 % — ABNORMAL HIGH (ref 0.4–3.1)

## 2018-05-01 LAB — TOTAL PROTEIN, URINE DIPSTICK: Protein, ur: 100 mg/dL — AB

## 2018-05-01 LAB — PREPARE RBC (CROSSMATCH)

## 2018-05-01 LAB — SAMPLE TO BLOOD BANK

## 2018-05-01 MED ORDER — SODIUM CHLORIDE 0.9 % IV SOLN
Freq: Once | INTRAVENOUS | Status: AC
  Administered 2018-05-01: 13:00:00 via INTRAVENOUS
  Filled 2018-05-01: qty 250

## 2018-05-01 MED ORDER — SODIUM CHLORIDE 0.9 % IV SOLN
125.0000 mg | Freq: Once | INTRAVENOUS | Status: AC
  Administered 2018-05-01: 125 mg via INTRAVENOUS
  Filled 2018-05-01: qty 10

## 2018-05-01 MED ORDER — HYDROCHLOROTHIAZIDE 12.5 MG PO TABS: ORAL_TABLET | ORAL | 0 refills | Status: DC

## 2018-05-01 MED ORDER — AMINOCAPROIC ACID 1000 MG PO TABS: 1000.0000 mg | ORAL_TABLET | Freq: Two times a day (BID) | ORAL | 1 refills | Status: DC

## 2018-05-01 MED ORDER — SODIUM CHLORIDE 0.9 % IV SOLN
600.0000 mg | Freq: Once | INTRAVENOUS | Status: AC
  Administered 2018-05-01: 600 mg via INTRAVENOUS
  Filled 2018-05-01: qty 16

## 2018-05-01 MED ORDER — SODIUM CHLORIDE 0.9 % IV SOLN
Freq: Once | INTRAVENOUS | Status: DC
  Filled 2018-05-01: qty 250

## 2018-05-01 MED ORDER — SERTRALINE HCL 50 MG PO TABS: 50.0000 mg | ORAL_TABLET | Freq: Every day | ORAL | 0 refills | Status: DC

## 2018-05-01 MED ORDER — SODIUM CHLORIDE 0.9% FLUSH
3.0000 mL | INTRAVENOUS | Status: DC | PRN
  Filled 2018-05-01: qty 10

## 2018-05-01 MED ORDER — AMLODIPINE BESYLATE 10 MG PO TABS: 10.0000 mg | ORAL_TABLET | Freq: Every day | ORAL | 0 refills | Status: DC

## 2018-05-01 NOTE — Patient Instructions (Addendum)
[facility] Discharge Instructions for Patients Receiving Chemotherapy  Today you received the following chemotherapy agents: Avastin  To help prevent nausea and vomiting after your treatment, we encourage you to take your nausea medication as directed.   If you develop nausea and vomiting that is not controlled by your nausea medication, call the clinic.   BELOW ARE SYMPTOMS THAT SHOULD BE REPORTED IMMEDIATELY:  *FEVER GREATER THAN 100.5 F  *CHILLS WITH OR WITHOUT FEVER  NAUSEA AND VOMITING THAT IS NOT CONTROLLED WITH YOUR NAUSEA MEDICATION  *UNUSUAL SHORTNESS OF BREATH  *UNUSUAL BRUISING OR BLEEDING  TENDERNESS IN MOUTH AND THROAT WITH OR WITHOUT PRESENCE OF ULCERS  *URINARY PROBLEMS  *BOWEL PROBLEMS  UNUSUAL RASH Items with * indicate a potential emergency and should be followed up as soon as possible.  Feel free to call the clinic should you have any questions or concerns. The clinic phone number is [phone].  Please show the [facility] at check-in to the Emergency Department and triage nurse.   Implanted [facility] Guide An implanted port is a device that is placed under the skin. It is usually placed in the chest. The device can be used to give IV medicine, to take blood, or for dialysis. You may have an implanted port if:  You need IV medicine that would be irritating to the small veins in your hands or arms.  You need IV medicines, such as antibiotics, for a long period of time.  You need IV nutrition for a long period of time.  You need dialysis. Having a port means that your health care provider will not need to use the veins in your arms for these procedures. You may have fewer limitations when using a port than you would if you used other types of long-term IVs, and you will likely be able to return to normal activities after your incision heals. An implanted port has two main parts:  Reservoir. The reservoir is the part where a  needle is inserted to give medicines or draw blood. The reservoir is round. After it is placed, it appears as a small, raised area under your skin.  Catheter. The catheter is a thin, flexible tube that connects the reservoir to a vein. Medicine that is inserted into the reservoir goes into the catheter and then into the vein. How is my port accessed? To access your port:  A numbing cream may be placed on the skin over the port site.  Your health care provider will put on a mask and sterile gloves.  The skin over your port will be cleaned carefully with a germ-killing soap and allowed to dry.  Your health care provider will gently pinch the port and insert a needle into it.  Your health care provider will check for a blood return to make sure the port is in the vein and is not clogged.  If your port needs to remain accessed to get medicine continuously (constant infusion), your health care provider will place a clear bandage (dressing) over the needle site. The dressing and needle will need to be changed every week, or as told by your health care provider. What is flushing? Flushing helps keep the port from getting clogged. Follow instructions from your health care provider about how and when to flush the port. Ports are usually flushed with saline solution or a medicine called heparin. The need for flushing will depend on how the port is used:  If the port is only  used from time to time to give medicines or draw blood, the port may need to be flushed: ? Before and after medicines have been given. ? Before and after blood has been drawn. ? As part of routine maintenance. Flushing may be recommended every 4-6 weeks.  If a constant infusion is running, the port may not need to be flushed.  Throw away any syringes in a disposal container that is meant for sharp items (sharps container). You can buy a sharps container from a pharmacy, or you can make one by using an empty hard plastic bottle  with a cover. How long will my port stay implanted? The port can stay in for as long as your health care provider thinks it is needed. When it is time for the port to come out, a surgery will be done to remove it. The surgery will be similar to the procedure that was done to put the port in. Follow these instructions at home:   Flush your port as told by your health care provider.  If you need an infusion over several days, follow instructions from your health care provider about how to take care of your port site. Make sure you: ? Wash your hands with soap and water before you change your dressing. If soap and water are not available, use alcohol-based hand sanitizer. ? Change your dressing as told by your health care provider. ? Place any used dressings or infusion bags into a plastic bag. Throw that bag in the trash. ? Keep the dressing that covers the needle clean and dry. Do not get it wet. ? Do not use scissors or sharp objects near the tube. ? Keep the tube clamped, unless it is being used.  Check your port site every day for signs of infection. Check for: ? Redness, swelling, or pain. ? Fluid or blood. ? Pus or a bad smell.  Protect the skin around the port site. ? Avoid wearing bra straps that rub or irritate the site. ? Protect the skin around your port from seat belts. Place a soft pad over your chest if needed.  Bathe or shower as told by your health care provider. The site may get wet as long as you are not actively receiving an infusion.  Return to your normal activities as told by your health care provider. Ask your health care provider what activities are safe for you.  Carry a medical alert card or wear a medical alert bracelet at all times. This will let health care providers know that you have an implanted port in case of an emergency. Get help right away if:  You have redness, swelling, or pain at the port site.  You have fluid or blood coming from your port  site.  You have pus or a bad smell coming from the port site.  You have a fever. Summary  Implanted ports are usually placed in the chest for long-term IV access.  Follow instructions from your health care provider about flushing the port and changing bandages (dressings).  Take care of the area around your port by avoiding clothing that puts pressure on the area, and by watching for signs of infection.  Protect the skin around your port from seat belts. Place a soft pad over your chest if needed.  Get help right away if you have a fever or you have redness, swelling, pain, drainage, or a bad smell at the port site. This information is not intended to replace advice given  to you by your health care provider. Make sure you discuss any questions you have with your health care provider. Document Released: 04/01/2005 Document Revised: 05/04/2016 Document Reviewed: 05/04/2016 Elsevier Interactive Patient Education  2019 [employer].

## 2018-05-01 NOTE — Progress Notes (Signed)
Okay to treat with urine protein of 100 per Dr. Burr Medico and patient will receive 2 units of PRBC's tomorrow per Dr. Burr Medico.

## 2018-05-02 ENCOUNTER — Inpatient Hospital Stay: Payer: Medicaid Other

## 2018-05-02 DIAGNOSIS — I780 Hereditary hemorrhagic telangiectasia: Secondary | ICD-10-CM

## 2018-05-02 DIAGNOSIS — Z5112 Encounter for antineoplastic immunotherapy: Secondary | ICD-10-CM | POA: Diagnosis not present

## 2018-05-02 DIAGNOSIS — I10 Essential (primary) hypertension: Secondary | ICD-10-CM

## 2018-05-02 DIAGNOSIS — D500 Iron deficiency anemia secondary to blood loss (chronic): Secondary | ICD-10-CM

## 2018-05-02 DIAGNOSIS — F411 Generalized anxiety disorder: Secondary | ICD-10-CM

## 2018-05-02 MED ORDER — ACETAMINOPHEN 325 MG PO TABS
ORAL_TABLET | ORAL | Status: AC
  Filled 2018-05-02: qty 2

## 2018-05-02 MED ORDER — DIPHENHYDRAMINE HCL 25 MG PO CAPS
ORAL_CAPSULE | ORAL | Status: AC
  Filled 2018-05-02: qty 1

## 2018-05-02 MED ORDER — SODIUM CHLORIDE 0.9% IV SOLUTION
250.0000 mL | Freq: Once | INTRAVENOUS | Status: AC
  Administered 2018-05-02: 250 mL via INTRAVENOUS
  Filled 2018-05-02: qty 250

## 2018-05-02 MED ORDER — DIPHENHYDRAMINE HCL 25 MG PO CAPS
25.0000 mg | ORAL_CAPSULE | Freq: Once | ORAL | Status: AC
  Administered 2018-05-02: 25 mg via ORAL

## 2018-05-02 MED ORDER — ACETAMINOPHEN 325 MG PO TABS
650.0000 mg | ORAL_TABLET | Freq: Once | ORAL | Status: AC
  Administered 2018-05-02: 650 mg via ORAL

## 2018-05-04 ENCOUNTER — Telehealth: Payer: Self-pay | Admitting: Hematology

## 2018-05-04 LAB — TYPE AND SCREEN
ABO/RH(D): A NEG
Antibody Screen: NEGATIVE
Unit division: 0
Unit division: 0

## 2018-05-04 LAB — BPAM RBC
Blood Product Expiration Date: 202002152359
Blood Product Expiration Date: 202002162359
ISSUE DATE / TIME: 202001180952
ISSUE DATE / TIME: 202001180952
UNIT TYPE AND RH: 600
Unit Type and Rh: 600

## 2018-05-04 NOTE — Telephone Encounter (Signed)
Called patient about scheduled appts.  Patient is aware of appts and patient stated she will get a print out of her appt calendar on 01/24.

## 2018-05-06 ENCOUNTER — Other Ambulatory Visit: Payer: Self-pay

## 2018-05-06 DIAGNOSIS — R040 Epistaxis: Secondary | ICD-10-CM

## 2018-05-06 MED ORDER — AMINOCAPROIC ACID 1000 MG PO TABS: 1000.0000 mg | ORAL_TABLET | Freq: Two times a day (BID) | ORAL | 1 refills | Status: DC

## 2018-05-08 ENCOUNTER — Inpatient Hospital Stay: Payer: Medicaid Other

## 2018-05-08 ENCOUNTER — Other Ambulatory Visit: Payer: Self-pay

## 2018-05-08 ENCOUNTER — Other Ambulatory Visit: Payer: Medicaid Other

## 2018-05-08 ENCOUNTER — Other Ambulatory Visit: Payer: Self-pay | Admitting: Hematology

## 2018-05-08 VITALS — BP 110/60 | HR 77 | Temp 97.6°F | Resp 18

## 2018-05-08 DIAGNOSIS — R040 Epistaxis: Secondary | ICD-10-CM

## 2018-05-08 DIAGNOSIS — D500 Iron deficiency anemia secondary to blood loss (chronic): Secondary | ICD-10-CM

## 2018-05-08 DIAGNOSIS — Z5112 Encounter for antineoplastic immunotherapy: Secondary | ICD-10-CM | POA: Diagnosis not present

## 2018-05-08 LAB — CBC WITH DIFFERENTIAL (CANCER CENTER ONLY)
Abs Immature Granulocytes: 0.03 10*3/uL (ref 0.00–0.07)
BASOS ABS: 0 10*3/uL (ref 0.0–0.1)
Basophils Relative: 0 %
Eosinophils Absolute: 0.1 10*3/uL (ref 0.0–0.5)
Eosinophils Relative: 1 %
HCT: 27.1 % — ABNORMAL LOW (ref 36.0–46.0)
Hemoglobin: 7.9 g/dL — ABNORMAL LOW (ref 12.0–15.0)
IMMATURE GRANULOCYTES: 0 %
LYMPHS PCT: 13 %
Lymphs Abs: 1.4 10*3/uL (ref 0.7–4.0)
MCH: 24.5 pg — ABNORMAL LOW (ref 26.0–34.0)
MCHC: 29.2 g/dL — ABNORMAL LOW (ref 30.0–36.0)
MCV: 84.2 fL (ref 80.0–100.0)
Monocytes Absolute: 0.5 10*3/uL (ref 0.1–1.0)
Monocytes Relative: 5 %
NRBC: 0 % (ref 0.0–0.2)
Neutro Abs: 8.8 10*3/uL — ABNORMAL HIGH (ref 1.7–7.7)
Neutrophils Relative %: 81 %
Platelet Count: 252 10*3/uL (ref 150–400)
RBC: 3.22 MIL/uL — ABNORMAL LOW (ref 3.87–5.11)
RDW: 21.9 % — ABNORMAL HIGH (ref 11.5–15.5)
WBC Count: 10.8 10*3/uL — ABNORMAL HIGH (ref 4.0–10.5)

## 2018-05-08 LAB — RETICULOCYTES
IMMATURE RETIC FRACT: 35.8 % — AB (ref 2.3–15.9)
RBC.: 3.22 MIL/uL — ABNORMAL LOW (ref 3.87–5.11)
RETIC COUNT ABSOLUTE: 142.3 10*3/uL (ref 19.0–186.0)
Retic Ct Pct: 4.4 % — ABNORMAL HIGH (ref 0.4–3.1)

## 2018-05-08 LAB — SAMPLE TO BLOOD BANK

## 2018-05-08 LAB — PREPARE RBC (CROSSMATCH)

## 2018-05-08 MED ORDER — SODIUM CHLORIDE 0.9 % IV SOLN
125.0000 mg | Freq: Once | INTRAVENOUS | Status: AC
  Administered 2018-05-08: 125 mg via INTRAVENOUS
  Filled 2018-05-08: qty 10

## 2018-05-08 MED ORDER — SODIUM CHLORIDE 0.9 % IV SOLN
Freq: Once | INTRAVENOUS | Status: AC
  Administered 2018-05-08: 16:00:00 via INTRAVENOUS
  Filled 2018-05-08: qty 250

## 2018-05-08 MED ORDER — AMINOCAPROIC ACID 1000 MG PO TABS: 1000.0000 mg | ORAL_TABLET | Freq: Two times a day (BID) | ORAL | 1 refills | Status: DC

## 2018-05-08 NOTE — Patient Instructions (Addendum)
Sodium Ferric Gluconate Complex injection What is this medicine? SODIUM FERRIC GLUCONATE COMPLEX (SOE dee um FER ik GLOO koe nate KOM pleks) is an iron replacement. It is used with epoetin therapy to treat low iron levels in patients who are receiving hemodialysis. This medicine may be used for other purposes; ask your health care provider or pharmacist if you have questions. COMMON BRAND NAME(S): Ferrlecit, Nulecit What should I tell my health care provider before I take this medicine? They need to know if you have any of the following conditions: -anemia that is not from iron deficiency -high levels of iron in the body -an unusual or allergic reaction to iron, benzyl alcohol, other medicines, foods, dyes, or preservatives -pregnant or are trying to become pregnant -breast-feeding How should I use this medicine? This medicine is for infusion into a vein. It is given by a health care professional in a hospital or clinic setting. Talk to your pediatrician regarding the use of this medicine in children. While this drug may be prescribed for children as young as 46 years old for selected conditions, precautions do apply. Overdosage: If you think you have taken too much of this medicine contact a poison control center or emergency room at once. NOTE: This medicine is only for you. Do not share this medicine with others. What if I miss a dose? It is important not to miss your dose. Call your doctor or health care professional if you are unable to keep an appointment. What may interact with this medicine? Do not take this medicine with any of the following medications: -deferoxamine -dimercaprol -other iron products This medicine may also interact with the following medications: -chloramphenicol -deferasirox -medicine for blood pressure like enalapril This list may not describe all possible interactions. Give your health care provider a list of all the medicines, herbs, non-prescription drugs,  or dietary supplements you use. Also tell them if you smoke, drink alcohol, or use illegal drugs. Some items may interact with your medicine. What should I watch for while using this medicine? Your condition will be monitored carefully while you are receiving this medicine. Visit your doctor for check-ups as directed. What side effects may I notice from receiving this medicine? Side effects that you should report to your doctor or health care professional as soon as possible: -allergic reactions like skin rash, itching or hives, swelling of the face, lips, or tongue -breathing problems -changes in hearing -changes in vision -chills, flushing, or sweating -fast, irregular heartbeat -feeling faint or lightheaded, falls -fever, flu-like symptoms -high or low blood pressure -pain, tingling, numbness in the hands or feet -severe pain in the chest, back, flanks, or groin -swelling of the ankles, feet, hands -trouble passing urine or change in the amount of urine -unusually weak or tired Side effects that usually do not require medical attention (report to your doctor or health care professional if they continue or are bothersome): -cramps -dark colored stools -diarrhea -headache -nausea, vomiting -stomach upset This list may not describe all possible side effects. Call your doctor for medical advice about side effects. You may report side effects to FDA at 1-800-FDA-1088. Where should I keep my medicine? This drug is given in a hospital or clinic and will not be stored at home. NOTE: This sheet is a summary. It may not cover all possible information. If you have questions about this medicine, talk to your doctor, pharmacist, or health care provider.  2018 Elsevier/Gold Standard (2007-12-02 15:58:57)

## 2018-05-09 ENCOUNTER — Inpatient Hospital Stay: Payer: Medicaid Other

## 2018-05-09 DIAGNOSIS — Z5112 Encounter for antineoplastic immunotherapy: Secondary | ICD-10-CM | POA: Diagnosis not present

## 2018-05-09 DIAGNOSIS — I780 Hereditary hemorrhagic telangiectasia: Secondary | ICD-10-CM

## 2018-05-09 DIAGNOSIS — I10 Essential (primary) hypertension: Secondary | ICD-10-CM

## 2018-05-09 DIAGNOSIS — D500 Iron deficiency anemia secondary to blood loss (chronic): Secondary | ICD-10-CM

## 2018-05-09 DIAGNOSIS — F411 Generalized anxiety disorder: Secondary | ICD-10-CM

## 2018-05-09 MED ORDER — SODIUM CHLORIDE 0.9% IV SOLUTION
250.0000 mL | Freq: Once | INTRAVENOUS | Status: AC
  Administered 2018-05-09: 250 mL via INTRAVENOUS
  Filled 2018-05-09: qty 250

## 2018-05-09 MED ORDER — DIPHENHYDRAMINE HCL 25 MG PO CAPS
25.0000 mg | ORAL_CAPSULE | Freq: Once | ORAL | Status: AC
  Administered 2018-05-09: 25 mg via ORAL

## 2018-05-09 MED ORDER — DIPHENHYDRAMINE HCL 25 MG PO CAPS
ORAL_CAPSULE | ORAL | Status: AC
  Filled 2018-05-09: qty 1

## 2018-05-09 MED ORDER — ACETAMINOPHEN 325 MG PO TABS
ORAL_TABLET | ORAL | Status: AC
  Filled 2018-05-09: qty 2

## 2018-05-09 MED ORDER — ACETAMINOPHEN 325 MG PO TABS
650.0000 mg | ORAL_TABLET | Freq: Once | ORAL | Status: AC
  Administered 2018-05-09: 650 mg via ORAL

## 2018-05-09 NOTE — Patient Instructions (Signed)
Blood Transfusion, Adult, Care After This sheet gives you information about how to care for yourself after your procedure. Your doctor may also give you more specific instructions. If you have problems or questions, contact your doctor. Follow these instructions at home:   Take over-the-counter and prescription medicines only as told by your doctor.  Go back to your normal activities as told by your doctor.  Follow instructions from your doctor about how to take care of the area where an IV tube was put into your vein (insertion site). Make sure you: ? Wash your hands with soap and water before you change your bandage (dressing). If there is no soap and water, use hand sanitizer. ? Change your bandage as told by your doctor.  Check your IV insertion site every day for signs of infection. Check for: ? More redness, swelling, or pain. ? More fluid or blood. ? Warmth. ? Pus or a bad smell. Contact a doctor if:  You have more redness, swelling, or pain around the IV insertion site.  You have more fluid or blood coming from the IV insertion site.  Your IV insertion site feels warm to the touch.  You have pus or a bad smell coming from the IV insertion site.  Your pee (urine) turns pink, red, or brown.  You feel weak after doing your normal activities. Get help right away if:  You have signs of a serious allergic or body defense (immune) system reaction, including: ? Itchiness. ? Hives. ? Trouble breathing. ? Anxiety. ? Pain in your chest or lower back. ? Fever, flushing, and chills. ? Fast pulse. ? Rash. ? Watery poop (diarrhea). ? Throwing up (vomiting). ? Dark pee. ? Serious headache. ? Dizziness. ? Stiff neck. ? Yellow color in your face or the white parts of your eyes (jaundice). Summary  After a blood transfusion, return to your normal activities as told by your doctor.  Every day, check for signs of infection where the IV tube was put into your vein.  Some  signs of infection are warm skin, more redness and pain, more fluid or blood, and pus or a bad smell where the needle went in.  Contact your doctor if you feel weak or have any unusual symptoms. This information is not intended to replace advice given to you by your health care provider. Make sure you discuss any questions you have with your health care provider. Document Released: 04/22/2014 Document Revised: 11/24/2015 Document Reviewed: 11/24/2015 Elsevier Interactive Patient Education  2019 [employer]  

## 2018-05-11 LAB — BPAM RBC
BLOOD PRODUCT EXPIRATION DATE: 202002192359
Blood Product Expiration Date: 202002192359
ISSUE DATE / TIME: 202001250936
ISSUE DATE / TIME: 202001250936
Unit Type and Rh: 600
Unit Type and Rh: 600

## 2018-05-11 LAB — TYPE AND SCREEN
ABO/RH(D): A NEG
Antibody Screen: NEGATIVE
UNIT DIVISION: 0
Unit division: 0

## 2018-05-13 NOTE — Progress Notes (Signed)
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 She presents to the clinic today bu herself. She notes she is doing well but notes still having epistaxis with blood clots from her nose. She notes her last BM was very dark brown, no black or bloody stool. Her last blood transfusion was 05/05/18. She will start Amicar today to help her epistaxis. She notes she has been dizzy and unsteady on her feet lately, she does not ambulate a cane. She denies neuropathy, but notes arthritis in her knee.    REVIEW OF SYSTEMS:   Constitutional: Denies fevers, chills or abnormal weight loss Eyes: Denies blurriness of vision Ears, nose, mouth, throat, and face: Denies mucositis or sore throat (+)Epistaxis  Respiratory: Denies cough, dyspnea or wheezes Cardiovascular: Denies palpitation, chest discomfort or lower extremity swelling Gastrointestinal:  Denies nausea, heartburn or change in bowel habits Skin: Denies abnormal skin rashes MSK: (+)  arthritis in knee (+) unbalanced on feet with dizziness  Lymphatics: Denies new lymphadenopathy or easy bruising Neurological:Denies numbness, tingling or new weaknesses Behavioral/Psych: Mood is stable, no new changes  All other systems were reviewed with the patient and are negative.  MEDICAL HISTORY:  Past Medical History:  Diagnosis Date  . Anxiety   . Arthritis    knnes,back  . GERD (gastroesophageal reflux disease)   . History of swelling of feet   . Hyperlipidemia   . Hypertension   . Major depressive disorder, recurrent episode ([place]) 06/05/2015  . Obesity   . Snores     SURGICAL HISTORY: Past Surgical History:  Procedure Laterality Date  . ABDOMINAL HYSTERECTOMY    . CARPAL TUNNEL RELEASE  05/13/2011   Procedure: CARPAL TUNNEL RELEASE;  Surgeon: Nita Sells, MD;  Location: [place];  Service: Orthopedics;  Laterality: Left;  . COLONOSCOPY WITH PROPOFOL N/A 04/28/2014   Procedure: COLONOSCOPY WITH PROPOFOL;  Surgeon: Cleotis Nipper, MD;  Location: [facility] ENDOSCOPY;  Service: Endoscopy;  Laterality: N/A;  . DG TOES*L*  2/10   rt  . DILATION AND CURETTAGE OF UTERUS    . ENTEROSCOPY N/A 10/17/2017   Procedure: ENTEROSCOPY;  Surgeon: Otis Brace, MD;  Location: [place] ENDOSCOPY;  Service: Gastroenterology;  Laterality: N/A;  . ESOPHAGOGASTRODUODENOSCOPY N/A 04/10/2014   Procedure: ESOPHAGOGASTRODUODENOSCOPY (EGD);  Surgeon: [name], MD;  Location: [facility] ENDOSCOPY;  Service: Endoscopy;  Laterality: N/A;  . ESOPHAGOGASTRODUODENOSCOPY N/A 05/10/2017   Procedure: ESOPHAGOGASTRODUODENOSCOPY (EGD);  Surgeon: Ronald Lobo, MD;  Location: [facility] ENDOSCOPY;  Service: Endoscopy;  Laterality: N/A;  . ESOPHAGOGASTRODUODENOSCOPY N/A 09/22/2017   Procedure: ESOPHAGOGASTRODUODENOSCOPY (EGD);  Surgeon: Clarene Essex, MD;  Location: [place];  Service: Endoscopy;  Laterality: N/A;  bedside  . ESOPHAGOGASTRODUODENOSCOPY (EGD) WITH PROPOFOL N/A 04/27/2014    Procedure: ESOPHAGOGASTRODUODENOSCOPY (EGD) WITH PROPOFOL;  Surgeon: Cleotis Nipper, MD;  Location: [place];  Service: Endoscopy;  Laterality: N/A;  possible apc  . ESOPHAGOGASTRODUODENOSCOPY (EGD) WITH PROPOFOL N/A 09/30/2017   Procedure: ESOPHAGOGASTRODUODENOSCOPY (EGD) WITH PROPOFOL;  Surgeon: [name], MD;  Location: [place];  Service: Gastroenterology;  Laterality: N/A;  . ESOPHAGOGASTRODUODENOSCOPY (EGD) WITH PROPOFOL N/A 10/01/2017   Procedure: ESOPHAGOGASTRODUODENOSCOPY (EGD) WITH PROPOFOL;  Surgeon: [name], MD;  Location: [place];  Service: Gastroenterology;  Laterality: N/A;  . ESOPHAGOGASTRODUODENOSCOPY (EGD) WITH PROPOFOL N/A 10/08/2017   Procedure: ESOPHAGOGASTRODUODENOSCOPY (EGD) WITH PROPOFOL;  Surgeon: Otis Brace, MD;  Location: [facility] ENDOSCOPY;  Service: Gastroenterology;  Laterality: N/A;  . ESOPHAGOGASTRODUODENOSCOPY (EGD) WITH PROPOFOL N/A 10/17/2017   Procedure: ESOPHAGOGASTRODUODENOSCOPY (EGD) WITH PROPOFOL;  Surgeon: Otis Brace, MD;  Location: [facility] ENDOSCOPY;  Service: Gastroenterology;  Laterality: N/A;  . ESOPHAGOGASTRODUODENOSCOPY (EGD) WITH PROPOFOL N/A 10/19/2017   Procedure: ESOPHAGOGASTRODUODENOSCOPY (EGD) WITH PROPOFOL;  Surgeon: Otis Brace, MD;  Location: [facility] ENDOSCOPY;  Service: Gastroenterology;  Laterality: N/A;  . GIVENS CAPSULE STUDY N/A 10/02/2017   Procedure: GIVENS CAPSULE STUDY;  Surgeon: [name], MD;  Location: [place];  Service: Gastroenterology;  Laterality: N/A;  . GIVENS CAPSULE STUDY N/A 10/08/2017   Procedure: GIVENS CAPSULE STUDY;  Surgeon: Otis Brace, MD;  Location: [place];  Service: Gastroenterology;  Laterality: N/A;  endoscopic placement of capsule  . HOT HEMOSTASIS N/A 04/27/2014   Procedure: HOT HEMOSTASIS (ARGON PLASMA COAGULATION/BICAP);  Surgeon: Cleotis Nipper, MD;  Location: [facility] ENDOSCOPY;  Service: Endoscopy;  Laterality: N/A;  . HOT HEMOSTASIS N/A 09/30/2017   Procedure: HOT HEMOSTASIS  (ARGON PLASMA COAGULATION/BICAP);  Surgeon: [name], MD;  Location: [place];  Service: Gastroenterology;  Laterality: N/A;  . HOT HEMOSTASIS N/A 10/01/2017   Procedure: HOT HEMOSTASIS (ARGON PLASMA COAGULATION/BICAP);  Surgeon: [name], MD;  Location: [place];  Service: Gastroenterology;  Laterality: N/A;  . HOT HEMOSTASIS N/A 10/17/2017   Procedure: HOT HEMOSTASIS (ARGON PLASMA COAGULATION/BICAP);  Surgeon: Otis Brace, MD;  Location: [facility] ENDOSCOPY;  Service: Gastroenterology;  Laterality: N/A;  . HOT HEMOSTASIS N/A 10/19/2017   Procedure: HOT HEMOSTASIS (ARGON PLASMA COAGULATION/BICAP);  Surgeon: Otis Brace, MD;  Location: [facility] ENDOSCOPY;  Service: Gastroenterology;  Laterality: N/A;  . L shoulder Surgery  2011  . SUBMUCOSAL INJECTION  09/22/2017   Procedure: SUBMUCOSAL INJECTION;  Surgeon: Clarene Essex, MD;  Location: [facility] ENDOSCOPY;  Service: Endoscopy;;    I have reviewed the social history and family history with the patient and they are unchanged from previous note.  ALLERGIES:  is allergic to feraheme [ferumoxytol]; nsaids; tomato; and wasp venom.  MEDICATIONS:  Current Outpatient Medications  Medication Sig Dispense Refill  . albuterol (PROVENTIL HFA;VENTOLIN HFA) 108 (90 Base) MCG/ACT inhaler Inhale 1-2 puffs into the lungs every 6 (six) hours as needed for wheezing or shortness of breath. 1 Inhaler 3  . ALPRAZolam (XANAX) 0.5 MG tablet Take 1 tablet (0.5 mg total) by mouth 2 (two) times daily as needed for anxiety. 60 tablet 1  . Aminocaproic Acid (AMICAR) 1000 MG TABS Take 1 tablet (1,000 mg total) by mouth 2 (two) times daily. 60 each 1  . amLODipine (NORVASC) 10 MG tablet Take 1 tablet (10 mg total) by mouth daily. 90 tablet 0  . atorvastatin (LIPITOR) 80 MG tablet TAKE 1 TABLET(80 MG) BY MOUTH DAILY 30  tablet 3  . hydrochlorothiazide (HYDRODIURIL) 12.5 MG tablet TAKE 1 TABLET(12.5 MG) BY MOUTH DAILY 90 tablet 0  . pantoprazole (PROTONIX) 40 MG tablet Take 1  tablet (40 mg total) by mouth daily. 30 tablet 3  . sertraline (ZOLOFT) 50 MG tablet Take 1 tablet (50 mg total) by mouth daily. 90 tablet 0  . tamoxifen (NOLVADEX) 20 MG tablet Take 1 tablet (20 mg total) by mouth daily. 90 tablet 3  . ferrous gluconate (FERGON) 324 MG tablet Take 1 tablet (324 mg total) by mouth 3 (three) times daily with meals. 90 tablet 0   No current facility-administered medications for this visit.    Facility-Administered Medications Ordered in Other Visits  Medication Dose Route Frequency Provider Last Rate Last Dose  . 0.9 %  sodium chloride infusion   Intravenous Once Truitt Merle, MD        PHYSICAL EXAMINATION: ECOG PERFORMANCE STATUS: 1 - Symptomatic but completely ambulatory  Vitals:   05/15/18 1407  BP: (!) 148/77  Pulse: 70  Resp: 18  Temp: 98 F (36.7 C)  SpO2: 100%   Filed Weights   05/15/18 1407  Weight: 272 lb 8 oz (123.6 kg)    GENERAL:alert, no distress and comfortable SKIN: skin color, texture, turgor are normal, no rashes or significant lesions EYES: normal, Conjunctiva are pink and non-injected, sclera clear OROPHARYNX:no exudate, no erythema and lips, buccal mucosa, and tongue normal  NECK: supple, thyroid normal size, non-tender, without nodularity LYMPH:  no palpable lymphadenopathy in the cervical, axillary or inguinal LUNGS: clear to auscultation and percussion with normal breathing effort HEART: regular rate & rhythm and no murmurs and no lower extremity edema ABDOMEN:abdomen soft, non-tender and normal bowel sounds Musculoskeletal:no cyanosis of digits and no clubbing  NEURO: alert & oriented x 3 with fluent speech, no focal motor/sensory deficits  LABORATORY DATA:  I have reviewed the data as listed CBC Latest Ref Rng & Units 05/15/2018 05/08/2018 05/01/2018  WBC 4.0 - 10.5 K/uL 13.0(H) 10.8(H) 10.9(H)  Hemoglobin 12.0 - 15.0 g/dL 9.9(L) 7.9(L) 7.6(L)  Hematocrit 36.0 - 46.0 % 33.5(L) 27.1(L) 26.4(L)  Platelets 150 - 400 K/uL  336 252 295     CMP Latest Ref Rng & Units 03/20/2018 02/19/2018 02/06/2018  Glucose 70 - 99 mg/dL 126(H) 148(H) 147(H)  BUN 6 - 20 mg/dL 11 9 11   Creatinine 0.44 - 1.00 mg/dL 0.86 0.85 0.98  Sodium 135 - 145 mmol/L 142 141 144  Potassium 3.5 - 5.1 mmol/L 3.6 3.7 3.6  Chloride 98 - 111 mmol/L 107 109 109  CO2 22 - 32 mmol/L 25 23 23   Calcium 8.9 - 10.3 mg/dL 8.8(L) 9.1 9.0  Total Protein 6.5 - 8.1 g/dL 7.3 7.6 7.3  Total Bilirubin 0.3 - 1.2 mg/dL 0.2(L) <0.2(L) 0.2(L)  Alkaline Phos 38 - 126 U/L 76 86 82  AST 15 - 41 U/L 11(L) 11(L) 10(L)  ALT 0 - 44 U/L 6 7 7       RADIOGRAPHIC STUDIES: I have personally reviewed the radiological images as listed and agreed with the findings in the report. No results found.   ASSESSMENT & PLAN:  RYLYNNE SCHICKER is a 58 y.o. female with   1. 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 Extensive workup found the pt to have HHT based on her personal and familyhistoryand recurrentAVM GI bleedings. -Sheis s/pIR embolization of  the left Gastric and short gastric artery branchvesselswithout evidence of residual flow in the Dieulafoy lesionon 12/10/2017. -ShereceivedAv[facility] and then every 2 weeks9/13/19-11/8/19 -She is currently onTamoxifensince7/2019 for HHT  -She has had recurrent GI bleeding and epistaxis again in December 2019, I have restarted Avastin in 03/2018.  -Labs reviewed, WBC at 13, hg improved to 9.9, ANC at 11. CMP and Iron panel is still pending. Will  Proceed with Avastin today and continue every 2 weeks  -Will continue labs weekly with IV iron as needed and Avastin every 2 weeks.  -Continue Tamoxifen daily.  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 -Continue Oral Fergon 1 tab TID. -Hemoglobin improved to 9.9 today (05/15/18),No need for blood transfusion. Her iron panel is still pending, will give IV iron if low.   3. Reactive Leukocytosis and reactive thrombocytosis  -She has mild leukocytosis with dominant neutrophils, likely reactive -Also has intermittent to moderate thrombocytosis, likely reactive to iron deficiency -continue monitoring  -WBC at 13 today (05/15/18)  4. 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 -I recommend she continue to follow up with her GI, Dr. Cristina Gong -Epistaxishas increased lately -She will start Amicar 1 g twice daily, today (05/15/18) to help reduce bleeding.   5. Smoking cessation, Cough  -She smokes about 10 cigarettes a day  -I think her recent cough is related to this  -I previously recommended she try to cut back if she cannot completely quit.    6.Weight loss, decreased appetite, fatigue -overall much improved lately -She continues to gain weight lately   PLAN: -Labs reviewed, No need for blood transfusion, will proceed with Avastin today and continue every 2 weeks  -lab and iv iron weekly if needed  -I refilled Lipitor and Protonix today  -Start Amicar today for Epistaxis, she will pick up today  -F/u in 4 weeks    No problem-specific Assessment & Plan notes found for this encounter.   No orders of the defined types were placed in this encounter.  All questions were answered. The patient knows to call the clinic with any problems, questions or  concerns. No barriers to learning was detected. I spent 20 minutes counseling the patient face to face. The total time spent in the appointment was 25 minutes and more than 50% was on counseling and review of test results     Truitt Merle, MD 05/15/2018   I, Joslyn Devon, am acting as scribe for Truitt Merle, MD.   I have reviewed the above documentation for accuracy and completeness, and I agree with the above.

## 2018-05-15 ENCOUNTER — Telehealth: Payer: Self-pay | Admitting: Hematology

## 2018-05-15 ENCOUNTER — Inpatient Hospital Stay: Payer: Medicaid Other

## 2018-05-15 ENCOUNTER — Other Ambulatory Visit: Payer: Self-pay

## 2018-05-15 ENCOUNTER — Inpatient Hospital Stay (HOSPITAL_BASED_OUTPATIENT_CLINIC_OR_DEPARTMENT_OTHER): Payer: Medicaid Other | Admitting: Hematology

## 2018-05-15 ENCOUNTER — Encounter: Payer: Self-pay | Admitting: Hematology

## 2018-05-15 VITALS — BP 145/78 | HR 77 | Temp 98.1°F | Resp 18

## 2018-05-15 VITALS — BP 148/77 | HR 70 | Temp 98.0°F | Resp 18 | Ht 65.0 in | Wt 272.5 lb

## 2018-05-15 DIAGNOSIS — D500 Iron deficiency anemia secondary to blood loss (chronic): Secondary | ICD-10-CM

## 2018-05-15 DIAGNOSIS — D72829 Elevated white blood cell count, unspecified: Secondary | ICD-10-CM

## 2018-05-15 DIAGNOSIS — R5383 Other fatigue: Secondary | ICD-10-CM

## 2018-05-15 DIAGNOSIS — E785 Hyperlipidemia, unspecified: Secondary | ICD-10-CM

## 2018-05-15 DIAGNOSIS — R05 Cough: Secondary | ICD-10-CM

## 2018-05-15 DIAGNOSIS — D473 Essential (hemorrhagic) thrombocythemia: Secondary | ICD-10-CM

## 2018-05-15 DIAGNOSIS — F17210 Nicotine dependence, cigarettes, uncomplicated: Secondary | ICD-10-CM

## 2018-05-15 DIAGNOSIS — Z5112 Encounter for antineoplastic immunotherapy: Secondary | ICD-10-CM | POA: Diagnosis not present

## 2018-05-15 DIAGNOSIS — R630 Anorexia: Secondary | ICD-10-CM

## 2018-05-15 DIAGNOSIS — I10 Essential (primary) hypertension: Secondary | ICD-10-CM

## 2018-05-15 DIAGNOSIS — I780 Hereditary hemorrhagic telangiectasia: Secondary | ICD-10-CM | POA: Diagnosis not present

## 2018-05-15 DIAGNOSIS — R040 Epistaxis: Secondary | ICD-10-CM | POA: Diagnosis not present

## 2018-05-15 DIAGNOSIS — K922 Gastrointestinal hemorrhage, unspecified: Secondary | ICD-10-CM | POA: Diagnosis not present

## 2018-05-15 DIAGNOSIS — R634 Abnormal weight loss: Secondary | ICD-10-CM

## 2018-05-15 LAB — RETICULOCYTES
Immature Retic Fract: 22 % — ABNORMAL HIGH (ref 2.3–15.9)
RBC.: 3.85 MIL/uL — ABNORMAL LOW (ref 3.87–5.11)
Retic Count, Absolute: 115.5 10*3/uL (ref 19.0–186.0)
Retic Ct Pct: 3 % (ref 0.4–3.1)

## 2018-05-15 LAB — SAMPLE TO BLOOD BANK

## 2018-05-15 LAB — IRON AND TIBC
IRON: 28 ug/dL — AB (ref 41–142)
Saturation Ratios: 9 % — ABNORMAL LOW (ref 21–57)
TIBC: 306 ug/dL (ref 236–444)
UIBC: 278 ug/dL (ref 120–384)

## 2018-05-15 LAB — CBC WITH DIFFERENTIAL (CANCER CENTER ONLY)
Abs Immature Granulocytes: 0.04 10*3/uL (ref 0.00–0.07)
Basophils Absolute: 0 10*3/uL (ref 0.0–0.1)
Basophils Relative: 0 %
Eosinophils Absolute: 0.1 10*3/uL (ref 0.0–0.5)
Eosinophils Relative: 1 %
HCT: 33.5 % — ABNORMAL LOW (ref 36.0–46.0)
HEMOGLOBIN: 9.9 g/dL — AB (ref 12.0–15.0)
Immature Granulocytes: 0 %
Lymphocytes Relative: 10 %
Lymphs Abs: 1.3 10*3/uL (ref 0.7–4.0)
MCH: 25.7 pg — ABNORMAL LOW (ref 26.0–34.0)
MCHC: 29.6 g/dL — ABNORMAL LOW (ref 30.0–36.0)
MCV: 87 fL (ref 80.0–100.0)
Monocytes Absolute: 0.5 10*3/uL (ref 0.1–1.0)
Monocytes Relative: 4 %
Neutro Abs: 11 10*3/uL — ABNORMAL HIGH (ref 1.7–7.7)
Neutrophils Relative %: 85 %
Platelet Count: 336 10*3/uL (ref 150–400)
RBC: 3.85 MIL/uL — ABNORMAL LOW (ref 3.87–5.11)
RDW: 19.9 % — ABNORMAL HIGH (ref 11.5–15.5)
WBC Count: 13 10*3/uL — ABNORMAL HIGH (ref 4.0–10.5)
nRBC: 0 % (ref 0.0–0.2)

## 2018-05-15 LAB — FERRITIN: FERRITIN: 100 ng/mL (ref 11–307)

## 2018-05-15 LAB — PREPARE RBC (CROSSMATCH)

## 2018-05-15 MED ORDER — SODIUM CHLORIDE 0.9 % IV SOLN
Freq: Once | INTRAVENOUS | Status: AC
  Administered 2018-05-15: 15:00:00 via INTRAVENOUS
  Filled 2018-05-15: qty 250

## 2018-05-15 MED ORDER — PANTOPRAZOLE SODIUM 40 MG PO TBEC: 40.0000 mg | DELAYED_RELEASE_TABLET | Freq: Every day | ORAL | 3 refills | Status: DC

## 2018-05-15 MED ORDER — SODIUM CHLORIDE 0.9 % IV SOLN
5.3000 mg/kg | Freq: Once | INTRAVENOUS | Status: AC
  Administered 2018-05-15: 600 mg via INTRAVENOUS
  Filled 2018-05-15: qty 16

## 2018-05-15 MED ORDER — SODIUM CHLORIDE 0.9 % IV SOLN
125.0000 mg | Freq: Once | INTRAVENOUS | Status: AC
  Administered 2018-05-15: 125 mg via INTRAVENOUS
  Filled 2018-05-15: qty 10

## 2018-05-15 MED ORDER — ATORVASTATIN CALCIUM 80 MG PO TABS: ORAL_TABLET | ORAL | 3 refills | Status: DC

## 2018-05-15 NOTE — Telephone Encounter (Signed)
Scheduled appt per 01/31 los.  Called patient and patient is aware of appt.  Printed and mailed calendar.

## 2018-05-15 NOTE — Patient Instructions (Signed)
[facility] Discharge Instructions for Patients Receiving Chemotherapy  Today you received the following chemotherapy agents: Bevicazumab (Avastin)  To help prevent nausea and vomiting after your treatment, we encourage you to take your nausea medication as directed.    If you develop nausea and vomiting that is not controlled by your nausea medication, call the clinic.   BELOW ARE SYMPTOMS THAT SHOULD BE REPORTED IMMEDIATELY:  *FEVER GREATER THAN 100.5 F  *CHILLS WITH OR WITHOUT FEVER  NAUSEA AND VOMITING THAT IS NOT CONTROLLED WITH YOUR NAUSEA MEDICATION  *UNUSUAL SHORTNESS OF BREATH  *UNUSUAL BRUISING OR BLEEDING  TENDERNESS IN MOUTH AND THROAT WITH OR WITHOUT PRESENCE OF ULCERS  *URINARY PROBLEMS  *BOWEL PROBLEMS  UNUSUAL RASH Items with * indicate a potential emergency and should be followed up as soon as possible.  Feel free to call the clinic should you have any questions or concerns. The clinic phone number is [phone].  Please show the [place] at check-in to the Emergency Department and triage nurse.  Sodium Ferric Gluconate Complex injection What is this medicine? SODIUM FERRIC GLUCONATE COMPLEX (SOE dee um FER ik GLOO koe nate KOM pleks) is an iron replacement. It is used with epoetin therapy to treat low iron levels in patients who are receiving hemodialysis. This medicine may be used for other purposes; ask your health care provider or pharmacist if you have questions. COMMON BRAND NAME(S): Ferrlecit, Nulecit What should I tell my health care provider before I take this medicine? They need to know if you have any of the following conditions: -anemia that is not from iron deficiency -high levels of iron in the body -an unusual or allergic reaction to iron, benzyl alcohol, other medicines, foods, dyes, or preservatives -pregnant or are trying to become pregnant -breast-feeding How should I use this medicine? This medicine is for  infusion into a vein. It is given by a health care professional in a hospital or clinic setting. Talk to your pediatrician regarding the use of this medicine in children. While this drug may be prescribed for children as young as 4 years old for selected conditions, precautions do apply. Overdosage: If you think you have taken too much of this medicine contact a poison control center or emergency room at once. NOTE: This medicine is only for you. Do not share this medicine with others. What if I miss a dose? It is important not to miss your dose. Call your doctor or health care professional if you are unable to keep an appointment. What may interact with this medicine? Do not take this medicine with any of the following medications: -deferoxamine -dimercaprol -other iron products This medicine may also interact with the following medications: -chloramphenicol -deferasirox -medicine for blood pressure like enalapril This list may not describe all possible interactions. Give your health care provider a list of all the medicines, herbs, non-prescription drugs, or dietary supplements you use. Also tell them if you smoke, drink alcohol, or use illegal drugs. Some items may interact with your medicine. What should I watch for while using this medicine? Your condition will be monitored carefully while you are receiving this medicine. Visit your doctor for check-ups as directed. What side effects may I notice from receiving this medicine? Side effects that you should report to your doctor or health care professional as soon as possible: -allergic reactions like skin rash, itching or hives, swelling of the face, lips, or tongue -breathing problems -changes in hearing -changes in vision -chills, flushing,  or sweating -fast, irregular heartbeat -feeling faint or lightheaded, falls -fever, flu-like symptoms -high or low blood pressure -pain, tingling, numbness in the hands or feet -severe pain in  the chest, back, flanks, or groin -swelling of the ankles, feet, hands -trouble passing urine or change in the amount of urine -unusually weak or tired Side effects that usually do not require medical attention (report to your doctor or health care professional if they continue or are bothersome): -cramps -dark colored stools -diarrhea -headache -nausea, vomiting -stomach upset This list may not describe all possible side effects. Call your doctor for medical advice about side effects. You may report side effects to FDA at 1-800-FDA-1088. Where should I keep my medicine? This drug is given in a hospital or clinic and will not be stored at home. NOTE: This sheet is a summary. It may not cover all possible information. If you have questions about this medicine, talk to your doctor, pharmacist, or health care provider.  2019 Elsevier/Gold Standard (2007-12-02 15:58:57)

## 2018-05-16 ENCOUNTER — Inpatient Hospital Stay: Payer: Medicaid Other

## 2018-05-19 LAB — TYPE AND SCREEN
ABO/RH(D): A NEG
Antibody Screen: NEGATIVE
Unit division: 0
Unit division: 0
Unit division: 0

## 2018-05-19 LAB — BPAM RBC
Blood Product Expiration Date: 202002202359
Blood Product Expiration Date: 202002202359
Blood Product Expiration Date: 202003012359
UNIT TYPE AND RH: 600
Unit Type and Rh: 600
Unit Type and Rh: 600

## 2018-05-20 ENCOUNTER — Telehealth: Payer: Self-pay

## 2018-05-20 NOTE — Telephone Encounter (Signed)
Called patient back regarding black tarry stools, she states has only happened once today, offered her an appointment for in the morning, unable to come, instructed her to observe her bowel movements and to call me if this happens again.  She states she will.

## 2018-05-20 NOTE — Telephone Encounter (Signed)
Patient calls with complaint of having black tarry stools again received iron last week, has appointment to come in on Friday 2/7 and blood on 2/8.  Wanted to know if she needs to do anything?  [phone]

## 2018-05-22 ENCOUNTER — Inpatient Hospital Stay: Payer: Medicaid Other

## 2018-05-22 ENCOUNTER — Other Ambulatory Visit: Payer: Self-pay

## 2018-05-22 ENCOUNTER — Inpatient Hospital Stay: Payer: Medicaid Other | Attending: Hematology

## 2018-05-22 VITALS — BP 130/66 | HR 68 | Temp 98.7°F | Resp 18

## 2018-05-22 DIAGNOSIS — R634 Abnormal weight loss: Secondary | ICD-10-CM | POA: Insufficient documentation

## 2018-05-22 DIAGNOSIS — D500 Iron deficiency anemia secondary to blood loss (chronic): Secondary | ICD-10-CM

## 2018-05-22 DIAGNOSIS — K922 Gastrointestinal hemorrhage, unspecified: Secondary | ICD-10-CM | POA: Diagnosis not present

## 2018-05-22 DIAGNOSIS — R630 Anorexia: Secondary | ICD-10-CM | POA: Insufficient documentation

## 2018-05-22 DIAGNOSIS — I780 Hereditary hemorrhagic telangiectasia: Secondary | ICD-10-CM | POA: Insufficient documentation

## 2018-05-22 DIAGNOSIS — R5383 Other fatigue: Secondary | ICD-10-CM | POA: Diagnosis not present

## 2018-05-22 DIAGNOSIS — Z5112 Encounter for antineoplastic immunotherapy: Secondary | ICD-10-CM | POA: Diagnosis not present

## 2018-05-22 LAB — CBC WITH DIFFERENTIAL (CANCER CENTER ONLY)
Abs Immature Granulocytes: 0.04 10*3/uL (ref 0.00–0.07)
BASOS PCT: 0 %
Basophils Absolute: 0.1 10*3/uL (ref 0.0–0.1)
EOS ABS: 0.1 10*3/uL (ref 0.0–0.5)
Eosinophils Relative: 1 %
HCT: 28 % — ABNORMAL LOW (ref 36.0–46.0)
Hemoglobin: 8.2 g/dL — ABNORMAL LOW (ref 12.0–15.0)
Immature Granulocytes: 0 %
Lymphocytes Relative: 12 %
Lymphs Abs: 1.4 10*3/uL (ref 0.7–4.0)
MCH: 25.9 pg — ABNORMAL LOW (ref 26.0–34.0)
MCHC: 29.3 g/dL — ABNORMAL LOW (ref 30.0–36.0)
MCV: 88.6 fL (ref 80.0–100.0)
Monocytes Absolute: 0.5 10*3/uL (ref 0.1–1.0)
Monocytes Relative: 5 %
Neutro Abs: 9 10*3/uL — ABNORMAL HIGH (ref 1.7–7.7)
Neutrophils Relative %: 82 %
PLATELETS: 341 10*3/uL (ref 150–400)
RBC: 3.16 MIL/uL — ABNORMAL LOW (ref 3.87–5.11)
RDW: 21.1 % — ABNORMAL HIGH (ref 11.5–15.5)
WBC Count: 11.1 10*3/uL — ABNORMAL HIGH (ref 4.0–10.5)
nRBC: 0 % (ref 0.0–0.2)

## 2018-05-22 LAB — RETICULOCYTES
Immature Retic Fract: 36 % — ABNORMAL HIGH (ref 2.3–15.9)
RBC.: 3.16 MIL/uL — ABNORMAL LOW (ref 3.87–5.11)
Retic Count, Absolute: 157.1 10*3/uL (ref 19.0–186.0)
Retic Ct Pct: 5 % — ABNORMAL HIGH (ref 0.4–3.1)

## 2018-05-22 LAB — SAMPLE TO BLOOD BANK

## 2018-05-22 MED ORDER — SODIUM CHLORIDE 0.9 % IV SOLN
Freq: Once | INTRAVENOUS | Status: AC
  Administered 2018-05-22: 14:00:00 via INTRAVENOUS
  Filled 2018-05-22: qty 250

## 2018-05-22 MED ORDER — SODIUM CHLORIDE 0.9 % IV SOLN
125.0000 mg | Freq: Once | INTRAVENOUS | Status: AC
  Administered 2018-05-22: 125 mg via INTRAVENOUS
  Filled 2018-05-22: qty 10

## 2018-05-22 NOTE — Patient Instructions (Signed)
Sodium Ferric Gluconate Complex injection  What is this medicine?  SODIUM FERRIC GLUCONATE COMPLEX (SOE dee um FER ik GLOO koe nate KOM pleks) is an iron replacement. It is used with epoetin therapy to treat low iron levels in patients who are receiving hemodialysis.  This medicine may be used for other purposes; ask your health care provider or pharmacist if you have questions.  COMMON BRAND NAME(S): Ferrlecit, Nulecit  What should I tell my health care provider before I take this medicine?  They need to know if you have any of the following conditions:  -anemia that is not from iron deficiency  -high levels of iron in the body  -an unusual or allergic reaction to iron, benzyl alcohol, other medicines, foods, dyes, or preservatives  -pregnant or are trying to become pregnant  -breast-feeding  How should I use this medicine?  This medicine is for infusion into a vein. It is given by a health care professional in a hospital or clinic setting.  Talk to your pediatrician regarding the use of this medicine in children. While this drug may be prescribed for children as young as 6 years old for selected conditions, precautions do apply.  Overdosage: If you think you have taken too much of this medicine contact a poison control center or emergency room at once.  NOTE: This medicine is only for you. Do not share this medicine with others.  What if I miss a dose?  It is important not to miss your dose. Call your doctor or health care professional if you are unable to keep an appointment.  What may interact with this medicine?  Do not take this medicine with any of the following medications:  -deferoxamine  -dimercaprol  -other iron products  This medicine may also interact with the following medications:  -chloramphenicol  -deferasirox  -medicine for blood pressure like enalapril  This list may not describe all possible interactions. Give your health care provider a list of all the medicines, herbs, non-prescription drugs,  or dietary supplements you use. Also tell them if you smoke, drink alcohol, or use illegal drugs. Some items may interact with your medicine.  What should I watch for while using this medicine?  Your condition will be monitored carefully while you are receiving this medicine.  Visit your doctor for check-ups as directed.  What side effects may I notice from receiving this medicine?  Side effects that you should report to your doctor or health care professional as soon as possible:  -allergic reactions like skin rash, itching or hives, swelling of the face, lips, or tongue  -breathing problems  -changes in hearing  -changes in vision  -chills, flushing, or sweating  -fast, irregular heartbeat  -feeling faint or lightheaded, falls  -fever, flu-like symptoms  -high or low blood pressure  -pain, tingling, numbness in the hands or feet  -severe pain in the chest, back, flanks, or groin  -swelling of the ankles, feet, hands  -trouble passing urine or change in the amount of urine  -unusually weak or tired  Side effects that usually do not require medical attention (report to your doctor or health care professional if they continue or are bothersome):  -cramps  -dark colored stools  -diarrhea  -headache  -nausea, vomiting  -stomach upset  This list may not describe all possible side effects. Call your doctor for medical advice about side effects. You may report side effects to FDA at 1-800-FDA-1088.  Where should I keep my medicine?  This   drug is given in a hospital or clinic and will not be stored at home.  NOTE: This sheet is a summary. It may not cover all possible information. If you have questions about this medicine, talk to your doctor, pharmacist, or health care provider.   2019 Elsevier/Gold Standard (2007-12-02 15:58:57)

## 2018-05-23 ENCOUNTER — Inpatient Hospital Stay: Payer: Medicaid Other

## 2018-05-23 DIAGNOSIS — D500 Iron deficiency anemia secondary to blood loss (chronic): Secondary | ICD-10-CM

## 2018-05-23 DIAGNOSIS — Z5112 Encounter for antineoplastic immunotherapy: Secondary | ICD-10-CM | POA: Diagnosis not present

## 2018-05-23 LAB — PREPARE RBC (CROSSMATCH)

## 2018-05-23 MED ORDER — ACETAMINOPHEN 325 MG PO TABS
ORAL_TABLET | ORAL | Status: AC
  Filled 2018-05-23: qty 2

## 2018-05-23 MED ORDER — ACETAMINOPHEN 325 MG PO TABS
650.0000 mg | ORAL_TABLET | Freq: Once | ORAL | Status: AC
  Administered 2018-05-23: 650 mg via ORAL

## 2018-05-23 MED ORDER — DIPHENHYDRAMINE HCL 25 MG PO CAPS
25.0000 mg | ORAL_CAPSULE | Freq: Once | ORAL | Status: AC
  Administered 2018-05-23: 25 mg via ORAL

## 2018-05-23 MED ORDER — SODIUM CHLORIDE 0.9% IV SOLUTION
250.0000 mL | Freq: Once | INTRAVENOUS | Status: AC
  Administered 2018-05-23: 250 mL via INTRAVENOUS
  Filled 2018-05-23: qty 250

## 2018-05-23 MED ORDER — DIPHENHYDRAMINE HCL 25 MG PO CAPS
ORAL_CAPSULE | ORAL | Status: AC
  Filled 2018-05-23: qty 1

## 2018-05-23 NOTE — Patient Instructions (Signed)
Blood Transfusion, Adult, Care After This sheet gives you information about how to care for yourself after your procedure. Your doctor may also give you more specific instructions. If you have problems or questions, contact your doctor. Follow these instructions at home:   Take over-the-counter and prescription medicines only as told by your doctor.  Go back to your normal activities as told by your doctor.  Follow instructions from your doctor about how to take care of the area where an IV tube was put into your vein (insertion site). Make sure you: ? Wash your hands with soap and water before you change your bandage (dressing). If there is no soap and water, use hand sanitizer. ? Change your bandage as told by your doctor.  Check your IV insertion site every day for signs of infection. Check for: ? More redness, swelling, or pain. ? More fluid or blood. ? Warmth. ? Pus or a bad smell. Contact a doctor if:  You have more redness, swelling, or pain around the IV insertion site.  You have more fluid or blood coming from the IV insertion site.  Your IV insertion site feels warm to the touch.  You have pus or a bad smell coming from the IV insertion site.  Your pee (urine) turns pink, red, or brown.  You feel weak after doing your normal activities. Get help right away if:  You have signs of a serious allergic or body defense (immune) system reaction, including: ? Itchiness. ? Hives. ? Trouble breathing. ? Anxiety. ? Pain in your chest or lower back. ? Fever, flushing, and chills. ? Fast pulse. ? Rash. ? Watery poop (diarrhea). ? Throwing up (vomiting). ? Dark pee. ? Serious headache. ? Dizziness. ? Stiff neck. ? Yellow color in your face or the white parts of your eyes (jaundice). Summary  After a blood transfusion, return to your normal activities as told by your doctor.  Every day, check for signs of infection where the IV tube was put into your vein.  Some  signs of infection are warm skin, more redness and pain, more fluid or blood, and pus or a bad smell where the needle went in.  Contact your doctor if you feel weak or have any unusual symptoms. This information is not intended to replace advice given to you by your health care provider. Make sure you discuss any questions you have with your health care provider. Document Released: 04/22/2014 Document Revised: 11/24/2015 Document Reviewed: 11/24/2015 Elsevier Interactive Patient Education  2019 [employer]  

## 2018-05-24 LAB — TYPE AND SCREEN
ABO/RH(D): A NEG
Antibody Screen: NEGATIVE
Unit division: 0
Unit division: 0

## 2018-05-24 LAB — BPAM RBC
BLOOD PRODUCT EXPIRATION DATE: 202002292359
Blood Product Expiration Date: 202003012359
ISSUE DATE / TIME: 202002081043
ISSUE DATE / TIME: 202002081043
UNIT TYPE AND RH: 600
Unit Type and Rh: 600

## 2018-05-29 ENCOUNTER — Inpatient Hospital Stay: Payer: Medicaid Other

## 2018-05-29 ENCOUNTER — Telehealth: Payer: Self-pay

## 2018-05-29 ENCOUNTER — Ambulatory Visit: Payer: Medicaid Other | Admitting: Hematology

## 2018-05-29 NOTE — Telephone Encounter (Signed)
Spoke with patient regarding her status, still having dark stools, not as black as before, denies any frank rectal bleeding, denies dizziness or weakness, asked that she come in Monday for lab work only, she states she can be here at 1:00, sent scheduling a message.  Asked patient to wait around for lab results. She verbalized an understanding.

## 2018-05-29 NOTE — Telephone Encounter (Signed)
Patient not seen for her appointment today, called her mobile and she stated that her son who brings her in for treatment has the flu and so she has no transportation today. Patient would like her appointment rescheduled. Scheduling notified of patient's request. Malachy Mood, RN also informed.

## 2018-06-01 ENCOUNTER — Telehealth: Payer: Self-pay

## 2018-06-01 ENCOUNTER — Inpatient Hospital Stay: Payer: Medicaid Other

## 2018-06-01 ENCOUNTER — Other Ambulatory Visit: Payer: Self-pay

## 2018-06-01 DIAGNOSIS — D500 Iron deficiency anemia secondary to blood loss (chronic): Secondary | ICD-10-CM

## 2018-06-01 DIAGNOSIS — Z5112 Encounter for antineoplastic immunotherapy: Secondary | ICD-10-CM | POA: Diagnosis not present

## 2018-06-01 LAB — CBC WITH DIFFERENTIAL (CANCER CENTER ONLY)
BASOS PCT: 0 %
Basophils Absolute: 0 10*3/uL (ref 0.0–0.1)
Eosinophils Absolute: 0.1 10*3/uL (ref 0.0–0.5)
Eosinophils Relative: 1 %
HCT: 32.1 % — ABNORMAL LOW (ref 36.0–46.0)
Hemoglobin: 9.3 g/dL — ABNORMAL LOW (ref 12.0–15.0)
Lymphocytes Relative: 15 %
Lymphs Abs: 1.6 10*3/uL (ref 0.7–4.0)
MCH: 25.9 pg — ABNORMAL LOW (ref 26.0–34.0)
MCHC: 29 g/dL — ABNORMAL LOW (ref 30.0–36.0)
MCV: 89.4 fL (ref 80.0–100.0)
Monocytes Absolute: 0.5 10*3/uL (ref 0.1–1.0)
Monocytes Relative: 5 %
Neutro Abs: 8 10*3/uL — ABNORMAL HIGH (ref 1.7–7.7)
Neutrophils Relative %: 79 %
Platelet Count: 296 10*3/uL (ref 150–400)
RBC: 3.59 MIL/uL — AB (ref 3.87–5.11)
RDW: 19.3 % — ABNORMAL HIGH (ref 11.5–15.5)
WBC Count: 10.2 10*3/uL (ref 4.0–10.5)
nRBC: 0 % (ref 0.0–0.2)

## 2018-06-01 LAB — RETICULOCYTES
IMMATURE RETIC FRACT: 27.3 % — AB (ref 2.3–15.9)
RBC.: 3.59 MIL/uL — ABNORMAL LOW (ref 3.87–5.11)
Retic Count, Absolute: 89.8 10*3/uL (ref 19.0–186.0)
Retic Ct Pct: 2.5 % (ref 0.4–3.1)

## 2018-06-01 LAB — TOTAL PROTEIN, URINE DIPSTICK: PROTEIN: 30 mg/dL — AB

## 2018-06-01 LAB — SAMPLE TO BLOOD BANK

## 2018-06-01 NOTE — Telephone Encounter (Signed)
Spoke with patient to let her know that hemoglobin was 9.3, no need for blood per Dr. Burr Medico, she will keep her appointments on Friday as scheduled.

## 2018-06-05 ENCOUNTER — Inpatient Hospital Stay: Payer: Medicaid Other

## 2018-06-05 VITALS — BP 126/67 | HR 66 | Temp 98.9°F | Resp 18

## 2018-06-05 DIAGNOSIS — I780 Hereditary hemorrhagic telangiectasia: Secondary | ICD-10-CM

## 2018-06-05 DIAGNOSIS — Z5112 Encounter for antineoplastic immunotherapy: Secondary | ICD-10-CM | POA: Diagnosis not present

## 2018-06-05 DIAGNOSIS — D500 Iron deficiency anemia secondary to blood loss (chronic): Secondary | ICD-10-CM

## 2018-06-05 LAB — CBC WITH DIFFERENTIAL (CANCER CENTER ONLY)
Abs Immature Granulocytes: 0.05 10*3/uL (ref 0.00–0.07)
Basophils Absolute: 0.1 10*3/uL (ref 0.0–0.1)
Basophils Relative: 0 %
Eosinophils Absolute: 0.1 10*3/uL (ref 0.0–0.5)
Eosinophils Relative: 1 %
HCT: 30.3 % — ABNORMAL LOW (ref 36.0–46.0)
Hemoglobin: 9.2 g/dL — ABNORMAL LOW (ref 12.0–15.0)
Immature Granulocytes: 0 %
Lymphocytes Relative: 13 %
Lymphs Abs: 1.6 10*3/uL (ref 0.7–4.0)
MCH: 25.9 pg — ABNORMAL LOW (ref 26.0–34.0)
MCHC: 30.4 g/dL (ref 30.0–36.0)
MCV: 85.4 fL (ref 80.0–100.0)
Monocytes Absolute: 0.5 10*3/uL (ref 0.1–1.0)
Monocytes Relative: 4 %
Neutro Abs: 10.6 10*3/uL — ABNORMAL HIGH (ref 1.7–7.7)
Neutrophils Relative %: 82 %
PLATELETS: 319 10*3/uL (ref 150–400)
RBC: 3.55 MIL/uL — AB (ref 3.87–5.11)
RDW: 19.1 % — AB (ref 11.5–15.5)
WBC Count: 12.9 10*3/uL — ABNORMAL HIGH (ref 4.0–10.5)
nRBC: 0 % (ref 0.0–0.2)

## 2018-06-05 LAB — RETICULOCYTES
Immature Retic Fract: 23.7 % — ABNORMAL HIGH (ref 2.3–15.9)
RBC.: 3.55 MIL/uL — ABNORMAL LOW (ref 3.87–5.11)
Retic Count, Absolute: 161.5 10*3/uL (ref 19.0–186.0)
Retic Ct Pct: 2.7 % (ref 0.4–3.1)

## 2018-06-05 MED ORDER — SODIUM CHLORIDE 0.9 % IV SOLN
Freq: Once | INTRAVENOUS | Status: AC
  Administered 2018-06-05: 15:00:00 via INTRAVENOUS
  Filled 2018-06-05: qty 250

## 2018-06-05 MED ORDER — SODIUM CHLORIDE 0.9 % IV SOLN
125.0000 mg | Freq: Once | INTRAVENOUS | Status: AC
  Administered 2018-06-05: 125 mg via INTRAVENOUS
  Filled 2018-06-05: qty 10

## 2018-06-05 MED ORDER — SODIUM CHLORIDE 0.9 % IV SOLN
5.3000 mg/kg | Freq: Once | INTRAVENOUS | Status: AC
  Administered 2018-06-05: 600 mg via INTRAVENOUS
  Filled 2018-06-05: qty 16

## 2018-06-05 NOTE — Progress Notes (Signed)
Per Dr Burr Medico because hgb 9.2 today no PRBC transfusion necessary.

## 2018-06-05 NOTE — Patient Instructions (Addendum)
[place] Discharge Instructions for Patients Receiving Chemotherapy  Today you received the following chemotherapy agents avastin   To help prevent nausea and vomiting after your treatment, we encourage you to take your nausea medication as directed  If you develop nausea and vomiting that is not controlled by your nausea medication, call the clinic.   BELOW ARE SYMPTOMS THAT SHOULD BE REPORTED IMMEDIATELY:  *FEVER GREATER THAN 100.5 F  *CHILLS WITH OR WITHOUT FEVER  NAUSEA AND VOMITING THAT IS NOT CONTROLLED WITH YOUR NAUSEA MEDICATION  *UNUSUAL SHORTNESS OF BREATH  *UNUSUAL BRUISING OR BLEEDING  TENDERNESS IN MOUTH AND THROAT WITH OR WITHOUT PRESENCE OF ULCERS  *URINARY PROBLEMS  *BOWEL PROBLEMS  UNUSUAL RASH Items with * indicate a potential emergency and should be followed up as soon as possible.  Feel free to call the clinic you have any questions or concerns. The clinic phone number is [phone].     Sodium Ferric Gluconate Complex injection What is this medicine? SODIUM FERRIC GLUCONATE COMPLEX (SOE dee um FER ik GLOO koe nate KOM pleks) is an iron replacement. It is used with epoetin therapy to treat low iron levels in patients who are receiving hemodialysis. This medicine may be used for other purposes; ask your health care provider or pharmacist if you have questions. COMMON BRAND NAME(S): Ferrlecit, Nulecit What should I tell my health care provider before I take this medicine? They need to know if you have any of the following conditions: -anemia that is not from iron deficiency -high levels of iron in the body -an unusual or allergic reaction to iron, benzyl alcohol, other medicines, foods, dyes, or preservatives -pregnant or are trying to become pregnant -breast-feeding How should I use this medicine? This medicine is for infusion into a vein. It is given by a health care professional in a hospital or clinic setting. Talk to your  pediatrician regarding the use of this medicine in children. While this drug may be prescribed for children as young as 2 years old for selected conditions, precautions do apply. Overdosage: If you think you have taken too much of this medicine contact a poison control center or emergency room at once. NOTE: This medicine is only for you. Do not share this medicine with others. What if I miss a dose? It is important not to miss your dose. Call your doctor or health care professional if you are unable to keep an appointment. What may interact with this medicine? Do not take this medicine with any of the following medications: -deferoxamine -dimercaprol -other iron products This medicine may also interact with the following medications: -chloramphenicol -deferasirox -medicine for blood pressure like enalapril This list may not describe all possible interactions. Give your health care provider a list of all the medicines, herbs, non-prescription drugs, or dietary supplements you use. Also tell them if you smoke, drink alcohol, or use illegal drugs. Some items may interact with your medicine. What should I watch for while using this medicine? Your condition will be monitored carefully while you are receiving this medicine. Visit your doctor for check-ups as directed. What side effects may I notice from receiving this medicine? Side effects that you should report to your doctor or health care professional as soon as possible: -allergic reactions like skin rash, itching or hives, swelling of the face, lips, or tongue -breathing problems -changes in hearing -changes in vision -chills, flushing, or sweating -fast, irregular heartbeat -feeling faint or lightheaded, falls -fever, flu-like symptoms -high or low  blood pressure -pain, tingling, numbness in the hands or feet -severe pain in the chest, back, flanks, or groin -swelling of the ankles, feet, hands -trouble passing urine or change in  the amount of urine -unusually weak or tired Side effects that usually do not require medical attention (report to your doctor or health care professional if they continue or are bothersome): -cramps -dark colored stools -diarrhea -headache -nausea, vomiting -stomach upset This list may not describe all possible side effects. Call your doctor for medical advice about side effects. You may report side effects to FDA at 1-800-FDA-1088. Where should I keep my medicine? This drug is given in a hospital or clinic and will not be stored at home. NOTE: This sheet is a summary. It may not cover all possible information. If you have questions about this medicine, talk to your doctor, pharmacist, or health care provider.  2019 Elsevier/Gold Standard (2007-12-02 15:58:57)

## 2018-06-08 DIAGNOSIS — M1990 Unspecified osteoarthritis, unspecified site: Secondary | ICD-10-CM | POA: Diagnosis not present

## 2018-06-09 DIAGNOSIS — M1990 Unspecified osteoarthritis, unspecified site: Secondary | ICD-10-CM | POA: Diagnosis not present

## 2018-06-10 DIAGNOSIS — M1990 Unspecified osteoarthritis, unspecified site: Secondary | ICD-10-CM | POA: Diagnosis not present

## 2018-06-10 NOTE — Progress Notes (Signed)
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 She presents to the clinic today by herself. She notes she is doing well. She note longer has black stool for about 1 week. She notes she has not gotten Amicar yet, but plans to today.  She notes she currently smokes 1/2 ppd. She notes she has tried to quit in the past but not successful.    REVIEW OF SYSTEMS:   Constitutional: Denies fevers, chills or abnormal weight loss Eyes: Denies blurriness of vision Ears, nose, mouth, throat, and face: Denies mucositis or sore throat Respiratory: Denies cough, dyspnea or wheezes Cardiovascular: Denies palpitation, chest discomfort or lower extremity swelling Gastrointestinal:  Denies nausea, heartburn or change in bowel habits Skin: Denies abnormal skin rashes Lymphatics: Denies new lymphadenopathy or easy bruising Neurological:Denies numbness, tingling or new weaknesses Behavioral/Psych: Mood is stable, no new changes  All other systems were reviewed with the patient and are  negative.  MEDICAL HISTORY:  Past Medical History:  Diagnosis Date  . Anxiety   . Arthritis    knnes,back  . GERD (gastroesophageal reflux disease)   . History of swelling of feet   . Hyperlipidemia   . Hypertension   . Major depressive disorder, recurrent episode ([place]) 06/05/2015  . Obesity   . Snores     SURGICAL HISTORY: Past Surgical History:  Procedure Laterality Date  . ABDOMINAL HYSTERECTOMY    . CARPAL TUNNEL RELEASE  05/13/2011   Procedure: CARPAL TUNNEL RELEASE;  Surgeon: Nita Sells, MD;  Location: [place];  Service: Orthopedics;  Laterality: Left;  . COLONOSCOPY WITH PROPOFOL N/A 04/28/2014   Procedure: COLONOSCOPY WITH PROPOFOL;  Surgeon: Cleotis Nipper, MD;  Location: [facility] ENDOSCOPY;  Service: Endoscopy;  Laterality: N/A;  . DG TOES*L*  2/10   rt  . DILATION AND CURETTAGE OF UTERUS    . ENTEROSCOPY N/A 10/17/2017   Procedure: ENTEROSCOPY;  Surgeon: Otis Brace, MD;  Location: [place] ENDOSCOPY;  Service: Gastroenterology;  Laterality: N/A;  . ESOPHAGOGASTRODUODENOSCOPY N/A 04/10/2014   Procedure: ESOPHAGOGASTRODUODENOSCOPY (EGD);  Surgeon: [name], MD;  Location[facility] ENDOSCOPY;  Service: Endoscopy;  Laterality: N/A;  . ESOPHAGOGASTRODUODENOSCOPY N/A 05/10/2017   Procedure: ESOPHAGOGASTRODUODENOSCOPY (EGD);  Surgeon: Ronald Lobo, MD;  Location[facility] ENDOSCOPY;  Service: Endoscopy;  Laterality: N/A;  . ESOPHAGOGASTRODUODENOSCOPY N/A 09/22/2017   Procedure: ESOPHAGOGASTRODUODENOSCOPY (EGD);  Surgeon: Clarene Essex, MD;  Location: [place];  Service: Endoscopy;  Laterality: N/A;  bedside  . ESOPHAGOGASTRODUODENOSCOPY (EGD) WITH PROPOFOL N/A 04/27/2014   Procedure: ESOPHAGOGASTRODUODENOSCOPY (EGD) WITH PROPOFOL;  Surgeon: Cleotis Nipper, MD;  Location: [place];  Service:  Endoscopy;  Laterality: N/A;  possible apc  . ESOPHAGOGASTRODUODENOSCOPY (EGD) WITH PROPOFOL N/A 09/30/2017   Procedure: ESOPHAGOGASTRODUODENOSCOPY (EGD)  WITH PROPOFOL;  Surgeon: [name], MD;  Location: [place];  Service: Gastroenterology;  Laterality: N/A;  . ESOPHAGOGASTRODUODENOSCOPY (EGD) WITH PROPOFOL N/A 10/01/2017   Procedure: ESOPHAGOGASTRODUODENOSCOPY (EGD) WITH PROPOFOL;  Surgeon: [name], MD;  Location: [place];  Service: Gastroenterology;  Laterality: N/A;  . ESOPHAGOGASTRODUODENOSCOPY (EGD) WITH PROPOFOL N/A 10/08/2017   Procedure: ESOPHAGOGASTRODUODENOSCOPY (EGD) WITH PROPOFOL;  Surgeon: Otis Brace, MD;  Location: [facility] ENDOSCOPY;  Service: Gastroenterology;  Laterality: N/A;  . ESOPHAGOGASTRODUODENOSCOPY (EGD) WITH PROPOFOL N/A 10/17/2017   Procedure: ESOPHAGOGASTRODUODENOSCOPY (EGD) WITH PROPOFOL;  Surgeon: Otis Brace, MD;  Location: [facility] ENDOSCOPY;  Service: Gastroenterology;  Laterality: N/A;  . ESOPHAGOGASTRODUODENOSCOPY (EGD) WITH PROPOFOL N/A 10/19/2017   Procedure: ESOPHAGOGASTRODUODENOSCOPY (EGD) WITH PROPOFOL;  Surgeon: Otis Brace, MD;  Location: [facility] ENDOSCOPY;  Service: Gastroenterology;  Laterality: N/A;  . GIVENS CAPSULE STUDY N/A 10/02/2017   Procedure: GIVENS CAPSULE STUDY;  Surgeon: [name], MD;  Location: [place];  Service: Gastroenterology;  Laterality: N/A;  . GIVENS CAPSULE STUDY N/A 10/08/2017   Procedure: GIVENS CAPSULE STUDY;  Surgeon: Otis Brace, MD;  Location: [place];  Service: Gastroenterology;  Laterality: N/A;  endoscopic placement of capsule  . HOT HEMOSTASIS N/A 04/27/2014   Procedure: HOT HEMOSTASIS (ARGON PLASMA COAGULATION/BICAP);  Surgeon: Cleotis Nipper, MD;  Location: [facility] ENDOSCOPY;  Service: Endoscopy;  Laterality: N/A;  . HOT HEMOSTASIS N/A 09/30/2017   Procedure: HOT HEMOSTASIS (ARGON PLASMA COAGULATION/BICAP);  Surgeon: [name], MD;  Location: [place];  Service: Gastroenterology;  Laterality: N/A;  . HOT HEMOSTASIS N/A 10/01/2017   Procedure: HOT HEMOSTASIS (ARGON PLASMA COAGULATION/BICAP);  Surgeon: [name], MD;  Location: [place];   Service: Gastroenterology;  Laterality: N/A;  . HOT HEMOSTASIS N/A 10/17/2017   Procedure: HOT HEMOSTASIS (ARGON PLASMA COAGULATION/BICAP);  Surgeon: Otis Brace, MD;  Location: [facility] ENDOSCOPY;  Service: Gastroenterology;  Laterality: N/A;  . HOT HEMOSTASIS N/A 10/19/2017   Procedure: HOT HEMOSTASIS (ARGON PLASMA COAGULATION/BICAP);  Surgeon: Otis Brace, MD;  Location: [facility] ENDOSCOPY;  Service: Gastroenterology;  Laterality: N/A;  . L shoulder Surgery  2011  . SUBMUCOSAL INJECTION  09/22/2017   Procedure: SUBMUCOSAL INJECTION;  Surgeon: Clarene Essex, MD;  Location: [facility] ENDOSCOPY;  Service: Endoscopy;;    I have reviewed the social history and family history with the patient and they are unchanged from previous note.  ALLERGIES:  is allergic to feraheme [ferumoxytol]; nsaids; tomato; and wasp venom.  MEDICATIONS:  Current Outpatient Medications  Medication Sig Dispense Refill  . albuterol (PROVENTIL HFA;VENTOLIN HFA) 108 (90 Base) MCG/ACT inhaler Inhale 1-2 puffs into the lungs every 6 (six) hours as needed for wheezing or shortness of breath. 1 Inhaler 3  . ALPRAZolam (XANAX) 0.5 MG tablet Take 1 tablet (0.5 mg total) by mouth 2 (two) times daily as needed for anxiety. 60 tablet 1  . Aminocaproic Acid (AMICAR) 1000 MG TABS Take 1 tablet (1,000 mg total) by mouth 2 (two) times daily. 60 each 1  . amLODipine (NORVASC) 10 MG tablet Take 1 tablet (10 mg total) by mouth daily. 90 tablet 0  . atorvastatin (LIPITOR) 80 MG tablet TAKE 1 TABLET(80 MG) BY MOUTH DAILY 30 tablet 3  . hydrochlorothiazide (HYDRODIURIL) 12.5 MG tablet TAKE 1 TABLET(12.5 MG) BY MOUTH DAILY 90 tablet 0  . pantoprazole (PROTONIX) 40 MG tablet Take 1 tablet (40 mg total) by mouth daily. 30 tablet 3  . sertraline (ZOLOFT) 50  MG tablet Take 1 tablet (50 mg total) by mouth daily. 90 tablet 0  . tamoxifen (NOLVADEX) 20 MG tablet Take 1 tablet (20 mg total) by mouth daily. 90 tablet 3  . ferrous gluconate (FERGON) 324 MG tablet  Take 1 tablet (324 mg total) by mouth 3 (three) times daily with meals. 90 tablet 0   No current facility-administered medications for this visit.    Facility-Administered Medications Ordered in Other Visits  Medication Dose Route Frequency Provider Last Rate Last Dose  . 0.9 %  sodium chloride infusion   Intravenous Once Truitt Merle, MD      . 0.9 %  sodium chloride infusion   Intravenous Continuous Truitt Merle, MD   Stopped at 06/12/18 1227    PHYSICAL EXAMINATION: ECOG PERFORMANCE STATUS: 1 - Symptomatic but completely ambulatory  Vitals:   06/12/18 0955  BP: (!) 145/75  Pulse: 87  Resp: 18  Temp: 98.1 F (36.7 C)  SpO2: 100%   Filed Weights   06/12/18 0955  Weight: 272 lb (123.4 kg)    GENERAL:alert, no distress and comfortable SKIN: skin color, texture, turgor are normal, no rashes or significant lesions EYES: normal, Conjunctiva are pink and non-injected, sclera clear OROPHARYNX:no exudate, no erythema and lips, buccal mucosa, and tongue normal  NECK: supple, thyroid normal size, non-tender, without nodularity LYMPH:  no palpable lymphadenopathy in the cervical, axillary or inguinal LUNGS: clear to auscultation and percussion with normal breathing effort HEART: regular rate & rhythm and no murmurs and no lower extremity edema ABDOMEN:abdomen soft, non-tender and normal bowel sounds Musculoskeletal:no cyanosis of digits and no clubbing  NEURO: alert & oriented x 3 with fluent speech, no focal motor/sensory deficits  LABORATORY DATA:  I have reviewed the data as listed CBC Latest Ref Rng & Units 06/12/2018 06/05/2018 06/01/2018  WBC 4.0 - 10.5 K/uL 11.5(H) 12.9(H) 10.2  Hemoglobin 12.0 - 15.0 g/dL 9.5(L) 9.2(L) 9.3(L)  Hematocrit 36.0 - 46.0 % 32.7(L) 30.3(L) 32.1(L)  Platelets 150 - 400 K/uL 294 319 296     CMP Latest Ref Rng & Units 03/20/2018 02/19/2018 02/06/2018  Glucose 70 - 99 mg/dL 126(H) 148(H) 147(H)  BUN 6 - 20 mg/dL 11 9 11   Creatinine 0.44 - 1.00 mg/dL 0.86  0.85 0.98  Sodium 135 - 145 mmol/L 142 141 144  Potassium 3.5 - 5.1 mmol/L 3.6 3.7 3.6  Chloride 98 - 111 mmol/L 107 109 109  CO2 22 - 32 mmol/L 25 23 23   Calcium 8.9 - 10.3 mg/dL 8.8(L) 9.1 9.0  Total Protein 6.5 - 8.1 g/dL 7.3 7.6 7.3  Total Bilirubin 0.3 - 1.2 mg/dL 0.2(L) <0.2(L) 0.2(L)  Alkaline Phos 38 - 126 U/L 76 86 82  AST 15 - 41 U/L 11(L) 11(L) 10(L)  ALT 0 - 44 U/L 6 7 7       RADIOGRAPHIC STUDIES: I have personally reviewed the radiological images as listed and agreed with the findings in the report. No results found.   ASSESSMENT & PLAN:  AZALYNN MAXIM is a 58 y.o. 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 Extensive workup found the pt to have HHT based on her personal and familyhistoryand recurrentAVM GI bleedings. -Sheis s/pIR embolization of the left Gastric and short gastric artery branchvesselswithout evidence of residual flow in the Dieulafoy lesionon 12/10/2017. -[facility]hereceived[facility] and  then every 2 weeks9/13/19-11/8/19 -She is currently onTamoxifensince7/2019 for HHT  -She hashad recurrent GI bleeding and epistaxis again in December 2019, I have restarted Avastin in 03/2018.  -She prefers to have a PAC placed due to difficultly with peripheral veins. I will refer her to IR  -Labs reviewed, Hg at 9.5, WBC at 11.5, ANC at 9.3, Iron panel is still pending. No need for blood transfusion today. She has responded to Avastin well, last blood transfusion 3 weeks ago.  -Proceed with IV Iron today and Avastin next week. May consider spacing out to every 3 weeks.   -I prescribed Amicar 1 g twice daily for her,she has not filled, plan to fill today at [facility] today  -Continue Tamoxifen daily.  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 -Continue Oral Fergon 1 tab TID. -Hemoglobin at 9.5 today (06/12/18),No need for blood transfusion. Her iron panel is still pending. Will give IV iron today  3. Reactive Leukocytosis and reactive thrombocytosis  -She has mild leukocytosis with dominant neutrophils, likely reactive -Also has intermittent to moderate thrombocytosis, likely reactive to iron deficiency -continue monitoring -WBC at 11.5, ANC at 9.3 (06/12/18)  4. 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 -I recommend she continue to follow up with her GI, Dr. Cristina Gong -Due to increased Epistaxis she previously started on Amicar 1 g twice daily, (05/15/18) to help reduce bleed  5. Smoking cessation, Cough  -She smokes about 10 cigarettes a day  -Cough improved. .  -I again recommended she try to cut back and eventually quit completely.     PLAN: -Labs reviewed, No need for blood transfusion, will proceed with IV Iron today and Avastin next week  -Start Amicar today for Epistaxis, she will pick up today  -Lab and Avastin infusion in 3 and 5 weeks -F/u in 5 weeks  -IR port placement     No problem-specific Assessment & Plan notes found for this encounter.   Orders Placed This Encounter  Procedures  . IR Perc Tun Perit Cath W/Port    Standing Status:   Future    Standing Expiration Date:   08/11/2019    Order Specific Question:   Reason for exam:    Answer:   chemo treatment    Order Specific Question:   Preferred Imaging Location?    Answer:   [facility]    Order Specific Question:   Is the patient pregnant?    Answer:   No   All questions were answered. The patient knows to call the clinic with any problems, questions  or concerns. No barriers to learning was detected. I spent 15 minutes counseling the patient face to face. The total time spent in the appointment was 20 minutes and more than 50% was on counseling and review of test results     Truitt Merle, MD 06/12/2018   I, Joslyn Devon, am acting as scribe for Truitt Merle, MD.   I have reviewed the above documentation for accuracy and completeness, and I agree with the above.

## 2018-06-11 ENCOUNTER — Other Ambulatory Visit: Payer: Self-pay

## 2018-06-11 DIAGNOSIS — M1990 Unspecified osteoarthritis, unspecified site: Secondary | ICD-10-CM | POA: Diagnosis not present

## 2018-06-11 DIAGNOSIS — D500 Iron deficiency anemia secondary to blood loss (chronic): Secondary | ICD-10-CM

## 2018-06-12 ENCOUNTER — Encounter: Payer: Self-pay | Admitting: Hematology

## 2018-06-12 ENCOUNTER — Inpatient Hospital Stay (HOSPITAL_BASED_OUTPATIENT_CLINIC_OR_DEPARTMENT_OTHER): Payer: Medicaid Other | Admitting: Hematology

## 2018-06-12 ENCOUNTER — Inpatient Hospital Stay: Payer: Medicaid Other

## 2018-06-12 ENCOUNTER — Telehealth: Payer: Self-pay | Admitting: Hematology

## 2018-06-12 VITALS — BP 145/75 | HR 87 | Temp 98.1°F | Resp 18 | Ht 65.0 in | Wt 272.0 lb

## 2018-06-12 VITALS — BP 159/76 | HR 68 | Temp 98.4°F | Resp 18

## 2018-06-12 DIAGNOSIS — D500 Iron deficiency anemia secondary to blood loss (chronic): Secondary | ICD-10-CM

## 2018-06-12 DIAGNOSIS — I780 Hereditary hemorrhagic telangiectasia: Secondary | ICD-10-CM

## 2018-06-12 DIAGNOSIS — Z5112 Encounter for antineoplastic immunotherapy: Secondary | ICD-10-CM | POA: Diagnosis not present

## 2018-06-12 DIAGNOSIS — R634 Abnormal weight loss: Secondary | ICD-10-CM

## 2018-06-12 DIAGNOSIS — K922 Gastrointestinal hemorrhage, unspecified: Secondary | ICD-10-CM

## 2018-06-12 DIAGNOSIS — I10 Essential (primary) hypertension: Secondary | ICD-10-CM

## 2018-06-12 DIAGNOSIS — R040 Epistaxis: Secondary | ICD-10-CM

## 2018-06-12 DIAGNOSIS — F17210 Nicotine dependence, cigarettes, uncomplicated: Secondary | ICD-10-CM

## 2018-06-12 DIAGNOSIS — D72829 Elevated white blood cell count, unspecified: Secondary | ICD-10-CM

## 2018-06-12 DIAGNOSIS — R5383 Other fatigue: Secondary | ICD-10-CM

## 2018-06-12 DIAGNOSIS — Z79810 Long term (current) use of selective estrogen receptor modulators (SERMs): Secondary | ICD-10-CM

## 2018-06-12 DIAGNOSIS — R630 Anorexia: Secondary | ICD-10-CM

## 2018-06-12 DIAGNOSIS — M1990 Unspecified osteoarthritis, unspecified site: Secondary | ICD-10-CM | POA: Diagnosis not present

## 2018-06-12 DIAGNOSIS — R05 Cough: Secondary | ICD-10-CM

## 2018-06-12 DIAGNOSIS — D473 Essential (hemorrhagic) thrombocythemia: Secondary | ICD-10-CM

## 2018-06-12 LAB — IRON AND TIBC
Iron: 22 ug/dL — ABNORMAL LOW (ref 41–142)
Saturation Ratios: 7 % — ABNORMAL LOW (ref 21–57)
TIBC: 290 ug/dL (ref 236–444)
UIBC: 269 ug/dL (ref 120–384)

## 2018-06-12 LAB — CBC WITH DIFFERENTIAL (CANCER CENTER ONLY)
Abs Immature Granulocytes: 0.04 10*3/uL (ref 0.00–0.07)
Basophils Absolute: 0 10*3/uL (ref 0.0–0.1)
Basophils Relative: 0 %
EOS PCT: 1 %
Eosinophils Absolute: 0.1 10*3/uL (ref 0.0–0.5)
HCT: 32.7 % — ABNORMAL LOW (ref 36.0–46.0)
Hemoglobin: 9.5 g/dL — ABNORMAL LOW (ref 12.0–15.0)
Immature Granulocytes: 0 %
LYMPHS PCT: 14 %
Lymphs Abs: 1.6 10*3/uL (ref 0.7–4.0)
MCH: 25.7 pg — ABNORMAL LOW (ref 26.0–34.0)
MCHC: 29.1 g/dL — ABNORMAL LOW (ref 30.0–36.0)
MCV: 88.4 fL (ref 80.0–100.0)
Monocytes Absolute: 0.4 10*3/uL (ref 0.1–1.0)
Monocytes Relative: 4 %
Neutro Abs: 9.3 10*3/uL — ABNORMAL HIGH (ref 1.7–7.7)
Neutrophils Relative %: 81 %
Platelet Count: 294 10*3/uL (ref 150–400)
RBC: 3.7 MIL/uL — ABNORMAL LOW (ref 3.87–5.11)
RDW: 19.4 % — ABNORMAL HIGH (ref 11.5–15.5)
WBC Count: 11.5 10*3/uL — ABNORMAL HIGH (ref 4.0–10.5)
nRBC: 0 % (ref 0.0–0.2)

## 2018-06-12 LAB — FERRITIN: FERRITIN: 68 ng/mL (ref 11–307)

## 2018-06-12 LAB — RETICULOCYTES
Immature Retic Fract: 25.2 % — ABNORMAL HIGH (ref 2.3–15.9)
RBC.: 3.7 MIL/uL — ABNORMAL LOW (ref 3.87–5.11)
Retic Count, Absolute: 87.3 10*3/uL (ref 19.0–186.0)
Retic Ct Pct: 2.4 % (ref 0.4–3.1)

## 2018-06-12 LAB — SAMPLE TO BLOOD BANK

## 2018-06-12 MED ORDER — SODIUM CHLORIDE 0.9 % IV SOLN
INTRAVENOUS | Status: DC
  Administered 2018-06-12: 11:00:00 via INTRAVENOUS
  Filled 2018-06-12: qty 250

## 2018-06-12 MED ORDER — SODIUM CHLORIDE 0.9 % IV SOLN
125.0000 mg | Freq: Once | INTRAVENOUS | Status: AC
  Administered 2018-06-12: 125 mg via INTRAVENOUS
  Filled 2018-06-12: qty 10

## 2018-06-12 NOTE — Telephone Encounter (Signed)
Scheduled appt 02/28 los.  Called treatment area and they will print out the patient calendar.

## 2018-06-12 NOTE — Patient Instructions (Signed)
Ferric carboxymaltose injection  What is this medicine?  FERRIC CARBOXYMALTOSE (ferr-ik car-box-ee-mol-toes) is an iron complex. Iron is used to make healthy red blood cells, which carry oxygen and nutrients throughout the body. This medicine is used to treat anemia in people with chronic kidney disease or people who cannot take iron by mouth.  This medicine may be used for other purposes; ask your health care provider or pharmacist if you have questions.  COMMON BRAND NAME(S): Injectafer  What should I tell my health care provider before I take this medicine?  They need to know if you have any of these conditions:  -high levels of iron in the blood  -liver disease  -an unusual or allergic reaction to iron, other medicines, foods, dyes, or preservatives  -pregnant or trying to get pregnant  -breast-feeding  How should I use this medicine?  This medicine is for infusion into a vein. It is given by a health care professional in a hospital or clinic setting.  Talk to your pediatrician regarding the use of this medicine in children. Special care may be needed.  Overdosage: If you think you have taken too much of this medicine contact a poison control center or emergency room at once.  NOTE: This medicine is only for you. Do not share this medicine with others.  What if I miss a dose?  It is important not to miss your dose. Call your doctor or health care professional if you are unable to keep an appointment.  What may interact with this medicine?  Do not take this medicine with any of the following medications:  -deferoxamine  -dimercaprol  -other iron products  This list may not describe all possible interactions. Give your health care provider a list of all the medicines, herbs, non-prescription drugs, or dietary supplements you use. Also tell them if you smoke, drink alcohol, or use illegal drugs. Some items may interact with your medicine.  What should I watch for while using this medicine?  Visit your doctor or  health care professional regularly. Tell your doctor if your symptoms do not start to get better or if they get worse. You may need blood work done while you are taking this medicine.  You may need to follow a special diet. Talk to your doctor. Foods that contain iron include: whole grains/cereals, dried fruits, beans, or peas, leafy green vegetables, and organ meats (liver, kidney).  What side effects may I notice from receiving this medicine?  Side effects that you should report to your doctor or health care professional as soon as possible:  -allergic reactions like skin rash, itching or hives, swelling of the face, lips, or tongue  -dizziness  -facial flushing  Side effects that usually do not require medical attention (report to your doctor or health care professional if they continue or are bothersome):  -changes in taste  -constipation  -headache  -nausea, vomiting  -pain, redness, or irritation at site where injected  This list may not describe all possible side effects. Call your doctor for medical advice about side effects. You may report side effects to FDA at 1-800-FDA-1088.  Where should I keep my medicine?  This drug is given in a hospital or clinic and will not be stored at home.  NOTE: This sheet is a summary. It may not cover all possible information. If you have questions about this medicine, talk to your doctor, pharmacist, or health care provider.   2019 Elsevier/Gold Standard (2016-05-16 09:40:29)

## 2018-06-12 NOTE — Progress Notes (Signed)
Per Dr. Burr Medico, patient is only to receive iron today, no Avastin.  Demetrius Charity, PharmD, [facility] Pharmacist Pharmacy Phone: [phone] 06/12/2018

## 2018-06-15 DIAGNOSIS — M1990 Unspecified osteoarthritis, unspecified site: Secondary | ICD-10-CM | POA: Diagnosis not present

## 2018-06-16 DIAGNOSIS — M1990 Unspecified osteoarthritis, unspecified site: Secondary | ICD-10-CM | POA: Diagnosis not present

## 2018-06-17 DIAGNOSIS — M1990 Unspecified osteoarthritis, unspecified site: Secondary | ICD-10-CM | POA: Diagnosis not present

## 2018-06-18 DIAGNOSIS — M1990 Unspecified osteoarthritis, unspecified site: Secondary | ICD-10-CM | POA: Diagnosis not present

## 2018-06-19 ENCOUNTER — Inpatient Hospital Stay: Payer: Medicaid Other

## 2018-06-19 DIAGNOSIS — M1990 Unspecified osteoarthritis, unspecified site: Secondary | ICD-10-CM | POA: Diagnosis not present

## 2018-06-22 ENCOUNTER — Other Ambulatory Visit: Payer: Self-pay | Admitting: Hematology

## 2018-06-22 DIAGNOSIS — M1990 Unspecified osteoarthritis, unspecified site: Secondary | ICD-10-CM | POA: Diagnosis not present

## 2018-06-22 NOTE — Progress Notes (Signed)
IV iron only on 3/11. Next Avastin will be 3/20. Patient has port placement scheduled for 3/13. Plan confirmed w/ MD. Treatment plan dates updated.  Demetrius Charity, PharmD, [place] Pharmac[facility] Pharm[facility] Phone: [phone] 06/22/2018

## 2018-06-23 DIAGNOSIS — M1990 Unspecified osteoarthritis, unspecified site: Secondary | ICD-10-CM | POA: Diagnosis not present

## 2018-06-24 ENCOUNTER — Telehealth: Payer: Self-pay

## 2018-06-24 ENCOUNTER — Inpatient Hospital Stay: Payer: Medicaid Other | Attending: Hematology

## 2018-06-24 ENCOUNTER — Inpatient Hospital Stay: Payer: Medicaid Other

## 2018-06-24 ENCOUNTER — Other Ambulatory Visit: Payer: Self-pay

## 2018-06-24 VITALS — BP 139/69 | HR 70 | Temp 98.9°F | Resp 16

## 2018-06-24 DIAGNOSIS — M1990 Unspecified osteoarthritis, unspecified site: Secondary | ICD-10-CM | POA: Diagnosis not present

## 2018-06-24 DIAGNOSIS — D500 Iron deficiency anemia secondary to blood loss (chronic): Secondary | ICD-10-CM

## 2018-06-24 DIAGNOSIS — K922 Gastrointestinal hemorrhage, unspecified: Secondary | ICD-10-CM | POA: Diagnosis not present

## 2018-06-24 MED ORDER — SODIUM CHLORIDE 0.9 % IV SOLN
Freq: Once | INTRAVENOUS | Status: AC
  Administered 2018-06-24: 15:00:00 via INTRAVENOUS
  Filled 2018-06-24: qty 250

## 2018-06-24 MED ORDER — SODIUM CHLORIDE 0.9 % IV SOLN
125.0000 mg | Freq: Once | INTRAVENOUS | Status: AC
  Administered 2018-06-24: 125 mg via INTRAVENOUS
  Filled 2018-06-24: qty 10

## 2018-06-24 NOTE — Patient Instructions (Signed)
Ferric carboxymaltose injection  What is this medicine?  FERRIC CARBOXYMALTOSE (ferr-ik car-box-ee-mol-toes) is an iron complex. Iron is used to make healthy red blood cells, which carry oxygen and nutrients throughout the body. This medicine is used to treat anemia in people with chronic kidney disease or people who cannot take iron by mouth.  This medicine may be used for other purposes; ask your health care provider or pharmacist if you have questions.  COMMON BRAND NAME(S): Injectafer  What should I tell my health care provider before I take this medicine?  They need to know if you have any of these conditions:  -high levels of iron in the blood  -liver disease  -an unusual or allergic reaction to iron, other medicines, foods, dyes, or preservatives  -pregnant or trying to get pregnant  -breast-feeding  How should I use this medicine?  This medicine is for infusion into a vein. It is given by a health care professional in a hospital or clinic setting.  Talk to your pediatrician regarding the use of this medicine in children. Special care may be needed.  Overdosage: If you think you have taken too much of this medicine contact a poison control center or emergency room at once.  NOTE: This medicine is only for you. Do not share this medicine with others.  What if I miss a dose?  It is important not to miss your dose. Call your doctor or health care professional if you are unable to keep an appointment.  What may interact with this medicine?  Do not take this medicine with any of the following medications:  -deferoxamine  -dimercaprol  -other iron products  This list may not describe all possible interactions. Give your health care provider a list of all the medicines, herbs, non-prescription drugs, or dietary supplements you use. Also tell them if you smoke, drink alcohol, or use illegal drugs. Some items may interact with your medicine.  What should I watch for while using this medicine?  Visit your doctor or  health care professional regularly. Tell your doctor if your symptoms do not start to get better or if they get worse. You may need blood work done while you are taking this medicine.  You may need to follow a special diet. Talk to your doctor. Foods that contain iron include: whole grains/cereals, dried fruits, beans, or peas, leafy green vegetables, and organ meats (liver, kidney).  What side effects may I notice from receiving this medicine?  Side effects that you should report to your doctor or health care professional as soon as possible:  -allergic reactions like skin rash, itching or hives, swelling of the face, lips, or tongue  -dizziness  -facial flushing  Side effects that usually do not require medical attention (report to your doctor or health care professional if they continue or are bothersome):  -changes in taste  -constipation  -headache  -nausea, vomiting  -pain, redness, or irritation at site where injected  This list may not describe all possible side effects. Call your doctor for medical advice about side effects. You may report side effects to FDA at 1-800-FDA-1088.  Where should I keep my medicine?  This drug is given in a hospital or clinic and will not be stored at home.  NOTE: This sheet is a summary. It may not cover all possible information. If you have questions about this medicine, talk to your doctor, pharmacist, or health care provider.   2019 Elsevier/Gold Standard (2016-05-16 09:40:29)

## 2018-06-24 NOTE — Progress Notes (Signed)
Pt declined to stay for the 30 minute post infusion observation period. VSS upon leaving.

## 2018-06-24 NOTE — Telephone Encounter (Signed)
Spoke to patient per Dr. Burr Medico will receive only IV iron today, Avastin will be on 3/20 after her port placement.  She verbalized an understanding.

## 2018-06-25 ENCOUNTER — Other Ambulatory Visit: Payer: Self-pay | Admitting: Student

## 2018-06-25 DIAGNOSIS — M1990 Unspecified osteoarthritis, unspecified site: Secondary | ICD-10-CM | POA: Diagnosis not present

## 2018-06-26 ENCOUNTER — Inpatient Hospital Stay (HOSPITAL_COMMUNITY): Admission: RE | Admit: 2018-06-26 | Payer: Medicaid Other | Source: Ambulatory Visit

## 2018-06-26 ENCOUNTER — Ambulatory Visit (HOSPITAL_COMMUNITY): Payer: Medicaid Other

## 2018-06-26 DIAGNOSIS — M1990 Unspecified osteoarthritis, unspecified site: Secondary | ICD-10-CM | POA: Diagnosis not present

## 2018-06-29 DIAGNOSIS — M1990 Unspecified osteoarthritis, unspecified site: Secondary | ICD-10-CM | POA: Diagnosis not present

## 2018-06-30 DIAGNOSIS — M1990 Unspecified osteoarthritis, unspecified site: Secondary | ICD-10-CM | POA: Diagnosis not present

## 2018-07-01 DIAGNOSIS — M1990 Unspecified osteoarthritis, unspecified site: Secondary | ICD-10-CM | POA: Diagnosis not present

## 2018-07-02 DIAGNOSIS — M1990 Unspecified osteoarthritis, unspecified site: Secondary | ICD-10-CM | POA: Diagnosis not present

## 2018-07-03 ENCOUNTER — Inpatient Hospital Stay: Payer: Medicaid Other

## 2018-07-03 ENCOUNTER — Telehealth: Payer: Self-pay

## 2018-07-03 ENCOUNTER — Telehealth: Payer: Self-pay | Admitting: Hematology

## 2018-07-03 DIAGNOSIS — M1990 Unspecified osteoarthritis, unspecified site: Secondary | ICD-10-CM | POA: Diagnosis not present

## 2018-07-03 NOTE — Telephone Encounter (Signed)
I called back and left a VM: if she has concerns about her health, I encouraged her to call us back and speak with my nurse or myself. Her last lab and treatment was 3 weeks ago, so I encouraged her to come in next week, instead of 2 weeks for lab and treatment, she knows to call us ASAP, if she does have any signs of GI bleeding.   Melissa, could you call her next Monday and reschedule her next appointment to next week? Thanks   Truitt Merle MD

## 2018-07-03 NOTE — Telephone Encounter (Signed)
Returned call to patient re cancelling 3/20 lab/infusion. Per patient she is cancelling due to feeling under the weather and will keep next appointment as scheduled for 4/3. Message routed to [place].

## 2018-07-03 NOTE — Telephone Encounter (Signed)
LVM for patient to reschedule her lab and infusion that she missed this morning.

## 2018-07-06 DIAGNOSIS — M1990 Unspecified osteoarthritis, unspecified site: Secondary | ICD-10-CM | POA: Diagnosis not present

## 2018-07-07 ENCOUNTER — Other Ambulatory Visit: Payer: Self-pay | Admitting: Radiology

## 2018-07-07 DIAGNOSIS — M1990 Unspecified osteoarthritis, unspecified site: Secondary | ICD-10-CM | POA: Diagnosis not present

## 2018-07-07 NOTE — Telephone Encounter (Signed)
Called patient today re seeing if patient can come in this week for appointments that were cancelled last week. Not able to reach patient. Left message asking that she call office re rescheduling per Dr. Burr Medico.  Message to [place].

## 2018-07-08 ENCOUNTER — Other Ambulatory Visit: Payer: Self-pay | Admitting: Radiology

## 2018-07-08 ENCOUNTER — Encounter (HOSPITAL_COMMUNITY): Payer: Self-pay

## 2018-07-08 ENCOUNTER — Ambulatory Visit (HOSPITAL_COMMUNITY)
Admission: RE | Admit: 2018-07-08 | Discharge: 2018-07-08 | Disposition: A | Discharge status: Home / Self Care | Payer: Medicaid Other | Source: Ambulatory Visit | Attending: Hematology | Admitting: Hematology

## 2018-07-08 ENCOUNTER — Other Ambulatory Visit: Payer: Self-pay | Admitting: Hematology

## 2018-07-08 ENCOUNTER — Inpatient Hospital Stay (HOSPITAL_COMMUNITY): Admission: RE | Admit: 2018-07-08 | Payer: Medicaid Other | Source: Ambulatory Visit

## 2018-07-08 ENCOUNTER — Other Ambulatory Visit: Payer: Self-pay

## 2018-07-08 ENCOUNTER — Ambulatory Visit (HOSPITAL_COMMUNITY): Payer: Medicaid Other

## 2018-07-08 DIAGNOSIS — K219 Gastro-esophageal reflux disease without esophagitis: Secondary | ICD-10-CM | POA: Insufficient documentation

## 2018-07-08 DIAGNOSIS — E119 Type 2 diabetes mellitus without complications: Secondary | ICD-10-CM | POA: Diagnosis not present

## 2018-07-08 DIAGNOSIS — I780 Hereditary hemorrhagic telangiectasia: Secondary | ICD-10-CM

## 2018-07-08 DIAGNOSIS — Z6841 Body Mass Index (BMI) 40.0 and over, adult: Secondary | ICD-10-CM | POA: Insufficient documentation

## 2018-07-08 DIAGNOSIS — D571 Sickle-cell disease without crisis: Secondary | ICD-10-CM | POA: Diagnosis not present

## 2018-07-08 DIAGNOSIS — Z79899 Other long term (current) drug therapy: Secondary | ICD-10-CM | POA: Diagnosis not present

## 2018-07-08 DIAGNOSIS — M1990 Unspecified osteoarthritis, unspecified site: Secondary | ICD-10-CM | POA: Diagnosis not present

## 2018-07-08 DIAGNOSIS — D649 Anemia, unspecified: Secondary | ICD-10-CM | POA: Diagnosis not present

## 2018-07-08 DIAGNOSIS — E669 Obesity, unspecified: Secondary | ICD-10-CM | POA: Diagnosis not present

## 2018-07-08 DIAGNOSIS — I10 Essential (primary) hypertension: Secondary | ICD-10-CM | POA: Insufficient documentation

## 2018-07-08 DIAGNOSIS — C9000 Multiple myeloma not having achieved remission: Secondary | ICD-10-CM | POA: Insufficient documentation

## 2018-07-08 DIAGNOSIS — F329 Major depressive disorder, single episode, unspecified: Secondary | ICD-10-CM | POA: Insufficient documentation

## 2018-07-08 DIAGNOSIS — F419 Anxiety disorder, unspecified: Secondary | ICD-10-CM | POA: Insufficient documentation

## 2018-07-08 DIAGNOSIS — E785 Hyperlipidemia, unspecified: Secondary | ICD-10-CM | POA: Diagnosis not present

## 2018-07-08 DIAGNOSIS — F17210 Nicotine dependence, cigarettes, uncomplicated: Secondary | ICD-10-CM | POA: Insufficient documentation

## 2018-07-08 HISTORY — DX: Hereditary hemorrhagic telangiectasia: I78.0

## 2018-07-08 HISTORY — PX: IR IMAGING GUIDED PORT INSERTION: IMG5740

## 2018-07-08 LAB — CBC
HCT: 28.1 % — ABNORMAL LOW (ref 36.0–46.0)
Hemoglobin: 8.3 g/dL — ABNORMAL LOW (ref 12.0–15.0)
MCH: 24.7 pg — ABNORMAL LOW (ref 26.0–34.0)
MCHC: 29.5 g/dL — ABNORMAL LOW (ref 30.0–36.0)
MCV: 83.6 fL (ref 80.0–100.0)
Platelets: 348 10*3/uL (ref 150–400)
RBC: 3.36 MIL/uL — AB (ref 3.87–5.11)
RDW: 20.4 % — ABNORMAL HIGH (ref 11.5–15.5)
WBC: 12.6 10*3/uL — ABNORMAL HIGH (ref 4.0–10.5)
nRBC: 0 % (ref 0.0–0.2)

## 2018-07-08 LAB — PROTIME-INR
INR: 1.1 (ref 0.8–1.2)
Prothrombin Time: 13.8 seconds (ref 11.4–15.2)

## 2018-07-08 LAB — APTT: aPTT: 27 seconds (ref 24–36)

## 2018-07-08 MED ORDER — SODIUM CHLORIDE 0.9 % IV SOLN: INTRAVENOUS | Status: DC

## 2018-07-08 MED ORDER — CEFAZOLIN SODIUM-DEXTROSE 2-4 GM/100ML-% IV SOLN
2.0000 g | Freq: Once | INTRAVENOUS | Status: AC
  Administered 2018-07-08: 2 g via INTRAVENOUS

## 2018-07-08 MED ORDER — MIDAZOLAM HCL 2 MG/2ML IJ SOLN
INTRAMUSCULAR | Status: AC
  Filled 2018-07-08: qty 2

## 2018-07-08 MED ORDER — HEPARIN SOD (PORK) LOCK FLUSH 100 UNIT/ML IV SOLN
INTRAVENOUS | Status: AC
  Filled 2018-07-08: qty 5

## 2018-07-08 MED ORDER — FENTANYL CITRATE (PF) 100 MCG/2ML IJ SOLN
INTRAMUSCULAR | Status: AC
  Filled 2018-07-08: qty 2

## 2018-07-08 MED ORDER — CEFAZOLIN SODIUM-DEXTROSE 2-4 GM/100ML-% IV SOLN
INTRAVENOUS | Status: AC
  Administered 2018-07-08: 2 g via INTRAVENOUS
  Filled 2018-07-08: qty 100

## 2018-07-08 MED ORDER — MIDAZOLAM HCL 2 MG/2ML IJ SOLN
INTRAMUSCULAR | Status: AC | PRN
  Administered 2018-07-08: 1 mg via INTRAVENOUS

## 2018-07-08 MED ORDER — FENTANYL CITRATE (PF) 100 MCG/2ML IJ SOLN
INTRAMUSCULAR | Status: AC | PRN
  Administered 2018-07-08: 50 ug via INTRAVENOUS

## 2018-07-08 MED ORDER — LIDOCAINE-EPINEPHRINE (PF) 1 %-1:200000 IJ SOLN
INTRAMUSCULAR | Status: AC
  Filled 2018-07-08: qty 30

## 2018-07-08 MED ORDER — LIDOCAINE-EPINEPHRINE (PF) 1 %-1:200000 IJ SOLN
INTRAMUSCULAR | Status: AC | PRN
  Administered 2018-07-08: 20 mL

## 2018-07-08 NOTE — Procedures (Signed)
Pre Procedure Dx: Poor venous access Post Procedural Dx: Same  Successful placement of right IJ approach port-a-cath with tip at the superior caval atrial junction. The catheter is ready for immediate use.  Estimated Blood Loss: Minimal  Complications: None immediate.  Jay Roma Bierlein, MD Pager #: [phone]   

## 2018-07-08 NOTE — H&P (Signed)
Chief Complaint: Patient was seen in consultation today for hereditary hemorraghic telangiectasia/Port-a-cath placement.  Referring Physician(s): Feng,Yan  Supervising Physician: Sandi Mariscal  Patient Status: [facility] - Out-pt  History of Present Illness: Peggy House is a 58 y.o. female with a past medical history of hypertension, hyperlipidemia, hereditary hemorrhagic telangectasia, asthma, GERD, GI bleed, hiatal hernia, renal insufficiency, multiple myeloma, sickle cell anemia, diabetes mellitus, arthritis, obesity, sleep apnea, major depressive disorder, and anxiety. 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 that has been managed by Dr. Burr Medico. She has history of IR embolization of the left gastric and short gastric artery branch vessels on 12/10/2017. Due to history of frequent blood transfusions and difficulty with peripheral veins, IR requested for possible Port-a-cath placement.  IR requested by Dr. Burr Medico for possible image-guided Port-a-cath placement. Patient awake and alert sitting in bed with no complaints at this time. Denies fever, chills, chest pain, dyspnea, cough, abdominal pain, or headache.   Past Medical History:  Diagnosis Date  . Anxiety   . Arthritis    knnes,back  . GERD (gastroesophageal reflux disease)   . Hereditary hemorrhagic telangiectasia ([place])   . History of swelling of feet   . Hyperlipidemia   . Hypertension   . Major depressive disorder, recurrent episode ([place]) 06/05/2015  . Obesity   . Snores     Past Surgical History:  Procedure Laterality Date  . ABDOMINAL HYSTERECTOMY    . CARPAL TUNNEL RELEASE  05/13/2011   Procedure: CARPAL TUNNEL RELEASE;  Surgeon: Nita Sells, MD;  Location: [place];  Service: Orthopedics;  Laterality: Left;  . COLONOSCOPY WITH PROPOFOL N/A 04/28/2014   Procedure: COLONOSCOPY WITH PROPOFOL;  Surgeon: Cleotis Nipper, MD;   Location: [facility] ENDOSCOPY;  Service: Endoscopy;  Laterality: N/A;  . DG TOES*L*  2/10   rt  . DILATION AND CURETTAGE OF UTERUS    . ENTEROSCOPY N/A 10/17/2017   Procedure: ENTEROSCOPY;  Surgeon: Otis Brace, MD;  Location: [facility] ENDOSCOPY;  Service: Gastroenterology;  Laterality: N/A;  . ESOPHAGOGASTRODUODENOSCOPY N/A 04/10/2014   Procedure: ESOPHAGOGASTRODUODENOSCOPY (EGD);  Surgeon: [name], MD;  Location: [facility] ENDOSCOPY;  Service: Endoscopy;  Laterality: N/A;  . ESOPHAGOGASTRODUODENOSCOPY N/A 05/10/2017   Procedure: ESOPHAGOGASTRODUODENOSCOPY (EGD);  Surgeon: Ronald Lobo, MD;  Location: [facility] ENDOSCOPY;  Service: Endoscopy;  Laterality: N/A;  . ESOPHAGOGASTRODUODENOSCOPY N/A 09/22/2017   Procedure: ESOPHAGOGASTRODUODENOSCOPY (EGD);  Surgeon: Clarene Essex, MD;  Location: [place];  Service: Endoscopy;  Laterality: N/A;  bedside  . ESOPHAGOGASTRODUODENOSCOPY (EGD) WITH PROPOFOL N/A 04/27/2014   Procedure: ESOPHAGOGASTRODUODENOSCOPY (EGD) WITH PROPOFOL;  Surgeon: Cleotis Nipper, MD;  Location: [place];  Service: Endoscopy;  Laterality: N/A;  possible apc  . ESOPHAGOGASTRODUODENOSCOPY (EGD) WITH PROPOFOL N/A 09/30/2017   Procedure: ESOPHAGOGASTRODUODENOSCOPY (EGD) WITH PROPOFOL;  Surgeon: [name], MD;  Location: [place];  Service: Gastroenterology;  Laterality: N/A;  . ESOPHAGOGASTRODUODENOSCOPY (EGD) WITH PROPOFOL N/A 10/01/2017   Procedure: ESOPHAGOGASTRODUODENOSCOPY (EGD) WITH PROPOFOL;  Surgeon: [name], MD;  Location: [place];  Service: Gastroenterology;  Laterality: N/A;  . ESOPHAGOGASTRODUODENOSCOPY (EGD) WITH PROPOFOL N/A 10/08/2017   Procedure: ESOPHAGOGASTRODUODENOSCOPY (EGD) WITH PROPOFOL;  Surgeon: Otis Brace, MD;  Location: [facility] ENDOSCOPY;  Service: Gastroenterology;  Laterality: N/A;  . ESOPHAGOGASTRODUODENOSCOPY (EGD) WITH PROPOFOL N/A 10/17/2017   Procedure: ESOPHAGOGASTRODUODENOSCOPY (EGD) WITH PROPOFOL;  Surgeon: Otis Brace, MD;   Location: [facility] ENDOSCOPY;  Service: Gastroenterology;  Laterality: N/A;  . ESOPHAGOGASTRODUODENOSCOPY (EGD) WITH PROPOFOL N/A 10/19/2017   Procedure: ESOPHAGOGASTRODUODENOSCOPY (EGD)  WITH PROPOFOL;  Surgeon: Otis Brace, MD;  Location: [facility] ENDOSCOPY;  Service: Gastroenterology;  Laterality: N/A;  . GIVENS CAPSULE STUDY N/A 10/02/2017   Procedure: GIVENS CAPSULE STUDY;  Surgeon: [name], MD;  Location: [place];  Service: Gastroenterology;  Laterality: N/A;  . GIVENS CAPSULE STUDY N/A 10/08/2017   Procedure: GIVENS CAPSULE STUDY;  Surgeon: Otis Brace, MD;  Location: [place];  Service: Gastroenterology;  Laterality: N/A;  endoscopic placement of capsule  . HOT HEMOSTASIS N/A 04/27/2014   Procedure: HOT HEMOSTASIS (ARGON PLASMA COAGULATION/BICAP);  Surgeon: Cleotis Nipper, MD;  Location: [facility] ENDOSCOPY;  Service: Endoscopy;  Laterality: N/A;  . HOT HEMOSTASIS N/A 09/30/2017   Procedure: HOT HEMOSTASIS (ARGON PLASMA COAGULATION/BICAP);  Surgeon: [name], MD;  Location: [place];  Service: Gastroenterology;  Laterality: N/A;  . HOT HEMOSTASIS N/A 10/01/2017   Procedure: HOT HEMOSTASIS (ARGON PLASMA COAGULATION/BICAP);  Surgeon: [name], MD;  Location: [place];  Service: Gastroenterology;  Laterality: N/A;  . HOT HEMOSTASIS N/A 10/17/2017   Procedure: HOT HEMOSTASIS (ARGON PLASMA COAGULATION/BICAP);  Surgeon: Otis Brace, MD;  Location: [facility] ENDOSCOPY;  Service: Gastroenterology;  Laterality: N/A;  . HOT HEMOSTASIS N/A 10/19/2017   Procedure: HOT HEMOSTASIS (ARGON PLASMA COAGULATION/BICAP);  Surgeon: Otis Brace, MD;  Location: [facility] ENDOSCOPY;  Service: Gastroenterology;  Laterality: N/A;  . L shoulder Surgery  2011  . SUBMUCOSAL INJECTION  09/22/2017   Procedure: SUBMUCOSAL INJECTION;  Surgeon: Clarene Essex, MD;  Location: [place];  Service: Endoscopy;;    Allergies: Feraheme [ferumoxytol]; Nsaids; Tomato; and Wasp venom  Medications: Prior to Admission  medications   Medication Sig Start Date End Date Taking? Authorizing Provider  albuterol (PROVENTIL HFA;VENTOLIN HFA) 108 (90 Base) MCG/ACT inhaler Inhale 1-2 puffs into the lungs every 6 (six) hours as needed for wheezing or shortness of breath. 04/03/17   Molt, Bethany, DO  ALPRAZolam (XANAX) 0.5 MG tablet Take 1 tablet (0.5 mg total) by mouth 2 (two) times daily as needed for anxiety. 04/03/18   Truitt Merle, MD  Aminocaproic Acid (AMICAR) 1000 MG TABS Take 1 tablet (1,000 mg total) by mouth 2 (two) times daily. 05/08/18   Truitt Merle, MD  amLODipine (NORVASC) 10 MG tablet Take 1 tablet (10 mg total) by mouth daily. 05/01/18   Truitt Merle, MD  atorvastatin (LIPITOR) 80 MG tablet TAKE 1 TABLET(80 MG) BY MOUTH DAILY 05/15/18   Truitt Merle, MD  ferrous gluconate (FERGON) 324 MG tablet Take 1 tablet (324 mg total) by mouth 3 (three) times daily with meals. 01/20/18 02/19/18  Kathi Ludwig, MD  hydrochlorothiazide (HYDRODIURIL) 12.5 MG tablet TAKE 1 TABLET(12.5 MG) BY MOUTH DAILY 05/01/18   Truitt Merle, MD  pantoprazole (PROTONIX) 40 MG tablet Take 1 tablet (40 mg total) by mouth daily. 05/15/18   Truitt Merle, MD  sertraline (ZOLOFT) 50 MG tablet Take 1 tablet (50 mg total) by mouth daily. 05/01/18   Truitt Merle, MD  tamoxifen (NOLVADEX) 20 MG tablet Take 1 tablet (20 mg total) by mouth daily. 04/03/18   Truitt Merle, MD     Family History  Problem Relation Age of Onset  . Cancer Mother        Ovarian  . Ovarian cancer Mother   . Diabetes Mother   . Kidney disease Mother   . Bleeding Disorder Mother   . Diabetes type II Sister   . Bleeding Disorder Sister   . Diabetes Sister   . Colon cancer Maternal Grandfather   . Crohn's disease Maternal Grandfather   .  Stomach cancer Maternal Grandmother   . Kidney disease Son   . Bleeding Disorder Son   . Dysmenorrhea Neg Hx     Social History   Socioeconomic History  . Marital status: Single    Spouse name: Not on file  . Number of children: Not on file  .  Years of education: Not on file  . Highest education level: Not on file  Occupational History  . Not on file  Social Needs  . Financial resource strain: Not on file  . Food insecurity:    Worry: Not on file    Inability: Not on file  . Transportation needs:    Medical: Not on file    Non-medical: Not on file  Tobacco Use  . Smoking status: Current Every Day Smoker    Packs/day: 0.50    Years: 30.00    Pack years: 15.00    Types: Cigarettes  . Smokeless tobacco: Former Systems developer    Types: Snuff    Quit date: 15  . Tobacco comment: 1/2 PPD  Substance and Sexual Activity  . Alcohol use: No    Alcohol/week: 0.0 standard drinks  . Drug use: No    Comment: last cocaine-2010  . Sexual activity: Yes    Birth control/protection: Surgical    Comment: Hysterectomy  Lifestyle  . Physical activity:    Days per week: Not on file    Minutes per session: Not on file  . Stress: Not on file  Relationships  . Social connections:    Talks on phone: Not on file    Gets together: Not on file    Attends religious service: Not on file    Active member of club or organization: Not on file    Attends meetings of clubs or organizations: Not on file    Relationship status: Not on file  Other Topics Concern  . Not on file  Social History Narrative  . Not on file     Review of Systems: A 12 point ROS discussed and pertinent positives are indicated in the HPI above.  All other systems are negative.  Review of Systems  Constitutional: Negative for chills and fever.  Respiratory: Negative for cough, shortness of breath and wheezing.   Cardiovascular: Negative for chest pain and palpitations.  Gastrointestinal: Negative for abdominal pain.  Neurological: Negative for headaches.  Psychiatric/Behavioral: Negative for behavioral problems and confusion.    Vital Signs: BP (!) 163/72   Pulse 64   Temp 99 F (37.2 C)   Ht '5\' 5"'$  (1.651 m)   Wt 263 lb (119.3 kg)   SpO2 100%   BMI 43.77 kg/m    Physical Exam Vitals signs and nursing note reviewed.  Constitutional:      General: She is not in acute distress.    Appearance: Normal appearance.  Cardiovascular:     Rate and Rhythm: Normal rate and regular rhythm.     Heart sounds: Normal heart sounds. No murmur.  Pulmonary:     Effort: Pulmonary effort is normal. No respiratory distress.     Breath sounds: Normal breath sounds. No wheezing.  Skin:    General: Skin is warm and dry.  Neurological:     Mental Status: She is alert and oriented to person, place, and time.  Psychiatric:        Mood and Affect: Mood normal.        Behavior: Behavior normal.        Thought Content: Thought content normal.  Judgment: Judgment normal.      MD Evaluation Airway: WNL Heart: WNL Abdomen: WNL Chest/ Lungs: WNL ASA  Classification: 3 Mallampati/Airway Score: Two   Imaging: No results found.  Labs:  CBC: Recent Labs    06/01/18 1315 06/05/18 1430 06/12/18 0922 07/08/18 1254  WBC 10.2 12.9* 11.5* 12.6*  HGB 9.3* 9.2* 9.5* 8.3*  HCT 32.1* 30.3* 32.7* 28.1*  PLT 296 319 294 348    COAGS: Recent Labs    09/21/17 2322 09/30/17 0228 10/06/17 1727 10/16/17 0925 07/08/18 1254  INR 1.45 1.25 1.37 1.16 1.1  APTT 25 27  --  29 27    BMP: Recent Labs    01/16/18 0832 02/06/18 1016 02/19/18 0855 03/20/18 1202  NA 145 144 141 142  K 3.4* 3.6 3.7 3.6  CL 108 109 109 107  CO2 '25 23 23 25  '$ GLUCOSE 92 147* 148* 126*  BUN '8 11 9 11  '$ CALCIUM 9.6 9.0 9.1 8.8*  CREATININE 0.77 0.98 0.85 0.86  GFRNONAA >60 >60 >60 >60  GFRAA >60 >60 >60 >60    LIVER FUNCTION TESTS: Recent Labs    01/16/18 0832 02/06/18 1016 02/19/18 0855 03/20/18 1202  BILITOT 0.5 0.2* <0.2* 0.2*  AST 12* 10* 11* 11*  ALT '6 7 7 '$ <6  ALKPHOS 79 82 86 76  PROT 7.7 7.3 7.6 7.3  ALBUMIN 3.5 3.3* 3.4* 3.4*     Assessment and Plan:  Hereditary hemorraghic telangiectasia. Plan for image-guided Port-a-cath placement today with Dr.  Pascal Lux. Patient is NPO. Afebrile. She does not take blood thinners. INR 1.1 seconds today.  Risks and benefits of image guided port-a-catheter placement was discussed with the patient including, but not limited to bleeding, infection, pneumothorax, or fibrin sheath development and need for additional procedures. All of the patient's questions were answered, patient is agreeable to proceed. Consent signed and in chart.   Thank you for this interesting consult.  I greatly enjoyed meeting NAKESHIA WALDECK and look forward to participating in their care.  A copy of this report was sent to the requesting provider on this date.  Electronically Signed: Earley Abide, PA-C 07/08/2018, 1:37 PM   I spent a total of 25 Minutes in face to face in clinical consultation, greater than 50% of which was counseling/coordinating care for hereditary hemorraghic telangiectasia/Port-a-cath placement.

## 2018-07-08 NOTE — Discharge Instructions (Addendum)
Implanted Port Insertion, Care After °This sheet gives you information about how to care for yourself after your procedure. Your health care provider may also give you more specific instructions. If you have problems or questions, contact your health care provider. °What can I expect after the procedure? °After the procedure, it is common to have: °· Discomfort at the port insertion site. °· Bruising on the skin over the port. This should improve over 3-4 days. °Follow these instructions at home: °Port care °· After your port is placed, you will get a manufacturer's information card. The card has information about your port. Keep this card with you at all times. °· Take care of the port as told by your health care provider. Ask your health care provider if you or a family member can get training for taking care of the port at home. A home health care nurse may also take care of the port. °· Make sure to remember what type of port you have. °Incision care ° °  ° °· Follow instructions from your health care provider about how to take care of your port insertion site. Make sure you: °? Wash your hands with soap and water before and after you change your bandage (dressing). If soap and water are not available, use hand sanitizer. °? Change your dressing as told by your health care provider. °? Leave stitches (sutures), skin glue, or adhesive strips in place. These skin closures may need to stay in place for 2 weeks or longer. If adhesive strip edges start to loosen and curl up, you may trim the loose edges. Do not remove adhesive strips completely unless your health care provider tells you to do that. °· Check your port insertion site every day for signs of infection. Check for: °? Redness, swelling, or pain. °? Fluid or blood. °? Warmth. °? Pus or a bad smell. °Activity °· Return to your normal activities as told by your health care provider. Ask your health care provider what activities are safe for you. °· Do not  lift anything that is heavier than 10 lb (4.5 kg), or the limit that you are told, until your health care provider says that it is safe. °General instructions °· Take over-the-counter and prescription medicines only as told by your health care provider. °· Do not take baths, swim, or use a hot tub until your health care provider approves. Ask your health care provider if you may take showers. You may only be allowed to take sponge baths. °· Do not drive for 24 hours if you were given a sedative during your procedure. °· Wear a medical alert bracelet in case of an emergency. This will tell any health care providers that you have a port. °· Keep all follow-up visits as told by your health care provider. This is important. °Contact a health care provider if: °· You cannot flush your port with saline as directed, or you cannot draw blood from the port. °· You have a fever or chills. °· You have redness, swelling, or pain around your port insertion site. °· You have fluid or blood coming from your port insertion site. °· Your port insertion site feels warm to the touch. °· You have pus or a bad smell coming from the port insertion site. °Get help right away if: °· You have chest pain or shortness of breath. °· You have bleeding from your port that you cannot control. °Summary °· Take care of the port as told by your health   care provider. Keep the manufacturer's information card with you at all times. °· Change your dressing as told by your health care provider. °· Contact a health care provider if you have a fever or chills or if you have redness, swelling, or pain around your port insertion site. °· Keep all follow-up visits as told by your health care provider. °This information is not intended to replace advice given to you by your health care provider. Make sure you discuss any questions you have with your health care provider. °Document Released: 01/20/2013 Document Revised: 10/28/2017 Document Reviewed:  10/28/2017 °Elsevier Interactive Patient Education © 2019 [employer] ° °Moderate Conscious Sedation, Adult, Care After °These instructions provide you with information about caring for yourself after your procedure. Your health care provider may also give you more specific instructions. Your treatment has been planned according to current medical practices, but problems sometimes occur. Call your health care provider if you have any problems or questions after your procedure. °What can I expect after the procedure? °After your procedure, it is common: °· To feel sleepy for several hours. °· To feel clumsy and have poor balance for several hours. °· To have poor judgment for several hours. °· To vomit if you eat too soon. °Follow these instructions at home: °For at least 24 hours after the procedure: ° °· Do not: °? Participate in activities where you could fall or become injured. °? Drive. °? Use heavy machinery. °? Drink alcohol. °? Take sleeping pills or medicines that cause drowsiness. °? Make important decisions or sign legal documents. °? Take care of children on your own. °· Rest. °Eating and drinking °· Follow the diet recommended by your health care provider. °· If you vomit: °? Drink water, juice, or soup when you can drink without vomiting. °? Make sure you have little or no nausea before eating solid foods. °General instructions °· Have a responsible adult stay with you until you are awake and alert. °· Take over-the-counter and prescription medicines only as told by your health care provider. °· If you smoke, do not smoke without supervision. °· Keep all follow-up visits as told by your health care provider. This is important. °Contact a health care provider if: °· You keep feeling nauseous or you keep vomiting. °· You feel light-headed. °· You develop a rash. °· You have a fever. °Get help right away if: °· You have trouble breathing. °This information is not intended to replace advice given to you  by your health care provider. Make sure you discuss any questions you have with your health care provider. °Document Released: 01/20/2013 Document Revised: 09/04/2015 Document Reviewed: 07/22/2015 °Elsevier Interactive Patient Education © 2019 [employer] ° °

## 2018-07-08 NOTE — Progress Notes (Signed)
Pt discharged home with son.  All discharge instructions discussed with the patient and written instructions given to patient.  All questions answered

## 2018-07-08 NOTE — Sedation Documentation (Signed)
Pt in IR holding

## 2018-07-09 DIAGNOSIS — M1990 Unspecified osteoarthritis, unspecified site: Secondary | ICD-10-CM | POA: Diagnosis not present

## 2018-07-10 DIAGNOSIS — M1990 Unspecified osteoarthritis, unspecified site: Secondary | ICD-10-CM | POA: Diagnosis not present

## 2018-07-13 DIAGNOSIS — M1990 Unspecified osteoarthritis, unspecified site: Secondary | ICD-10-CM | POA: Diagnosis not present

## 2018-07-14 DIAGNOSIS — M1990 Unspecified osteoarthritis, unspecified site: Secondary | ICD-10-CM | POA: Diagnosis not present

## 2018-07-15 DIAGNOSIS — M1990 Unspecified osteoarthritis, unspecified site: Secondary | ICD-10-CM | POA: Diagnosis not present

## 2018-07-16 ENCOUNTER — Other Ambulatory Visit: Payer: Self-pay

## 2018-07-16 DIAGNOSIS — R040 Epistaxis: Secondary | ICD-10-CM

## 2018-07-16 DIAGNOSIS — M1990 Unspecified osteoarthritis, unspecified site: Secondary | ICD-10-CM | POA: Diagnosis not present

## 2018-07-16 MED ORDER — AMINOCAPROIC ACID 1000 MG PO TABS: 1000.0000 mg | ORAL_TABLET | Freq: Two times a day (BID) | ORAL | 1 refills | Status: DC

## 2018-07-16 NOTE — Progress Notes (Signed)
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 She presents to the clinic today by herself. She notes she did not come in 2 weeks ago for her treament and labs. She did not feel well from nausea at that time. She notes today her stool is black for the past 5 days and has mild intermittent nausea still. She has a BM usually after she eats. She denies red blood or abdominal pain. She also has significant fatigue along with SOB upon exertion.     REVIEW OF SYSTEMS:   Constitutional: Denies fevers, chills or abnormal weight loss (+) Fatigue  Eyes: Denies blurriness of vision Ears, nose, mouth, throat, and face: Denies mucositis or sore throat Respiratory: Denies wheezes (+) SOB upon exertion  Cardiovascular: Denies palpitation, chest discomfort or lower extremity swelling Gastrointestinal:  Denies heartburn or change in bowel habits (+) Mild intermittent nausea (+)Black stool  Skin: Denies abnormal skin rashes Lymphatics: Denies new lymphadenopathy or easy bruising  Neurological:Denies numbness, tingling or new weaknesses Behavioral/Psych: Mood is stable, no new changes  All other systems were reviewed with the patient and are negative.  MEDICAL HISTORY:  Past Medical History:  Diagnosis Date  . Anxiety   . Arthritis    knnes,back  . GERD (gastroesophageal reflux disease)   . 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 History of swelling of feet   . Hyperlipidemia   . Hypertension   . Major depressive disorder, recurrent episode ([place]) 06/05/2015  . Obesity   . Snores     SURGICAL HISTORY: Past Surgical History:  Procedure Laterality Date  . ABDOMINAL HYSTERECTOMY    . CARPAL TUNNEL RELEASE  05/13/2011   Procedure: CARPAL TUNNEL RELEASE;  Surgeon: Nita Sells, MD;  Location: [place];  Service: Orthopedics;  Laterality: Left;  . COLONOSCOPY WITH PROPOFOL N/A 04/28/2014   Procedure: COLONOSCOPY WITH PROPOFOL;  Surgeon: Cleotis Nipper, MD;  Location: [facility] ENDOSCOPY;  Service: Endoscopy;  Laterality: N/A;  . DG TOES*L*  2/10   rt  . DILATION AND CURETTAGE OF UTERUS    . ENTEROSCOPY N/A 10/17/2017   Procedure: ENTEROSCOPY;  Surgeon: Otis Brace, MD;  Location: [place] ENDOSCOPY;  Service: Gastroenterology;  Laterality: N/A;  . ESOPHAGOGASTRODUODENOSCOPY N/A 04/10/2014   Procedure: ESOPHAGOGASTRODUODENOSCOPY (EGD);  Surgeon: [name], MD;  Location: [facility] ENDOSCOPY;  Service: Endoscopy;  Laterality: N/A;  . ESOPHAGOGASTRODUODENOSCOPY N/A 05/10/2017   Procedure: ESOPHAGOGASTRODUODENOSCOPY (EGD);  Surgeon: Ronald Lobo, MD;  Location: [facility] ENDOSCOPY;  Service: Endoscopy;  Laterality: N/A;  . ESOPHAGOGASTRODUODENOSCOPY N/A 09/22/2017   Procedure: ESOPHAGOGASTRODUODENOSCOPY (EGD);  Surgeon: Clarene Essex, MD;  Location: [facility] ENDOSCOPY;  Service: Endoscopy;  Laterality: N/A;  bedside  . ESOPHAGOGASTRODUODENOSCOPY (EGD) WITH PROPOFOL N/A 04/27/2014   Procedure: ESOPHAGOGASTRODUODENOSCOPY (EGD) WITH PROPOFOL;  Surgeon:  Cleotis Nipper, MD;  Location: [place];  Service: Endoscopy;  Laterality: N/A;  possible apc  . ESOPHAGOGASTRODUODENOSCOPY (EGD) WITH PROPOFOL N/A 09/30/2017   Procedure: ESOPHAGOGASTRODUODENOSCOPY (EGD) WITH PROPOFOL;  Surgeon: [name], MD;  Location: [place];  Service: Gastroenterology;  Laterality: N/A;  . ESOPHAGOGASTRODUODENOSCOPY (EGD) WITH PROPOFOL N/A 10/01/2017   Procedure: ESOPHAGOGASTRODUODENOSCOPY (EGD) WITH PROPOFOL;  Surgeon: [name], MD;  Location: [place];  Service: Gastroenterology;  Laterality: N/A;  . ESOPHAGOGASTRODUODENOSCOPY (EGD) WITH PROPOFOL N/A 10/08/2017   Procedure: ESOPHAGOGASTRODUODENOSCOPY (EGD) WITH PROPOFOL;  Surgeon: Otis Brace, MD;  Location: [facility] ENDOSCOPY;  Service: Gastroenterology;  Laterality: N/A;  . ESOPHAGOGASTRODUODENOSCOPY (EGD) WITH PROPOFOL N/A 10/17/2017   Procedure: ESOPHAGOGASTRODUODENOSCOPY (EGD) WITH PROPOFOL;  Surgeon: Otis Brace, MD;  Location: [facility] ENDOSCOPY;  Service: Gastroenterology;  Laterality: N/A;  . ESOPHAGOGASTRODUODENOSCOPY (EGD) WITH PROPOFOL N/A 10/19/2017   Procedure: ESOPHAGOGASTRODUODENOSCOPY (EGD) WITH PROPOFOL;  Surgeon: Otis Brace, MD;  Location: [facility] ENDOSCOPY;  Service: Gastroenterology;  Laterality: N/A;  . GIVENS CAPSULE STUDY N/A 10/02/2017   Procedure: GIVENS CAPSULE STUDY;  Surgeon: [name], MD;  Location: [place];  Service: Gastroenterology;  Laterality: N/A;  . GIVENS CAPSULE STUDY N/A 10/08/2017   Procedure: GIVENS CAPSULE STUDY;  Surgeon: Otis Brace, MD;  Location: [place];  Service: Gastroenterology;  Laterality: N/A;  endoscopic placement of capsule  . HOT HEMOSTASIS N/A 04/27/2014   Procedure: HOT HEMOSTASIS (ARGON PLASMA COAGULATION/BICAP);  Surgeon: Cleotis Nipper, MD;  Location: [facility] ENDOSCOPY;  Service: Endoscopy;  Laterality: N/A;  . HOT HEMOSTASIS N/A 09/30/2017   Procedure: HOT HEMOSTASIS (ARGON PLASMA COAGULATION/BICAP);  Surgeon: [name], MD;   Location: [place];  Service: Gastroenterology;  Laterality: N/A;  . HOT HEMOSTASIS N/A 10/01/2017   Procedure: HOT HEMOSTASIS (ARGON PLASMA COAGULATION/BICAP);  Surgeon: [name], MD;  Location: [place];  Service: Gastroenterology;  Laterality: N/A;  . HOT HEMOSTASIS N/A 10/17/2017   Procedure: HOT HEMOSTASIS (ARGON PLASMA COAGULATION/BICAP);  Surgeon: Otis Brace, MD;  Location: [facility] ENDOSCOPY;  Service: Gastroenterology;  Laterality: N/A;  . HOT HEMOSTASIS N/A 10/19/2017   Procedure: HOT HEMOSTASIS (ARGON PLASMA COAGULATION/BICAP);  Surgeon: Otis Brace, MD;  Location: [facility] ENDOSCOPY;  Service: Gastroenterology;  Laterality: N/A;  . IR IMAGING GUIDED PORT INSERTION  07/08/2018  . L shoulder Surgery  2011  . SUBMUCOSAL INJECTION  09/22/2017   Procedure: SUBMUCOSAL INJECTION;  Surgeon: Clarene Essex, MD;  Location: [facility] ENDOSCOPY;  Service: Endoscopy;;    I have reviewed the social history and family history with the patient and they are unchanged from previous note.  ALLERGIES:  is allergic to feraheme [ferumoxytol]; nsaids; tomato; and wasp venom.  MEDICATIONS:  Current Outpatient Medications  Medication Sig Dispense Refill  . albuterol (PROVENTIL HFA;VENTOLIN HFA) 108 (90 Base) MCG/ACT inhaler Inhale 1-2 puffs into the lungs every 6 (six) hours as needed for wheezing or shortness of breath. 1 Inhaler 3  . ALPRAZolam (XANAX) 0.5 MG tablet Take 1 tablet (0.5 mg total) by mouth 2 (two) times daily as needed for anxiety. 60 tablet 1  . Aminocaproic Acid (AMICAR) 1000 MG TABS Take 1 tablet (1,000 mg total) by mouth 2 (two) times daily. 60 each 1  . amLODipine (NORVASC) 10 MG tablet Take 1 tablet (10 mg total) by mouth daily. 90 tablet 0  . atorvastatin (LIPITOR) 80 MG tablet TAKE 1 TABLET(80 MG)  BY MOUTH DAILY 30 tablet 3  . hydrochlorothiazide (HYDRODIURIL) 12.5 MG tablet TAKE 1 TABLET(12.5 MG) BY MOUTH DAILY 90 tablet 0  . pantoprazole (PROTONIX) 40 MG tablet Take 1 tablet (40 mg  total) by mouth daily. 30 tablet 3  . sertraline (ZOLOFT) 50 MG tablet Take 1 tablet (50 mg total) by mouth daily. 90 tablet 0  . tamoxifen (NOLVADEX) 20 MG tablet Take 1 tablet (20 mg total) by mouth daily. 90 tablet 3  . ferrous gluconate (FERGON) 324 MG tablet Take 1 tablet (324 mg total) by mouth 3 (three) times daily with meals. 90 tablet 0  . lidocaine-prilocaine (EMLA) cream Apply 1 application topically as needed. 30 g 0  . prochlorperazine (COMPAZINE) 10 MG tablet Take 1 tablet (10 mg total) by mouth every 6 (six) hours as needed for nausea or vomiting. 30 tablet 1   No current facility-administered medications for this visit.    Facility-Administered Medications Ordered in Other Visits  Medication Dose Route Frequency Provider Last Rate Last Dose  . 0.9 %  sodium chloride infusion (Manually program via Guardrails IV Fluids)  250 mL Intravenous Once Truitt Merle, MD      . 0.9 %  sodium chloride infusion   Intravenous Once Truitt Merle, MD      . acetaminophen (TYLENOL) tablet 650 mg  650 mg Oral Once Truitt Merle, MD      . diphenhydrAMINE (BENADRYL) capsule 25 mg  25 mg Oral Once Truitt Merle, MD      . ferric gluconate (NULECIT) 125 mg in sodium chloride 0.9 % 100 mL IVPB  125 mg Intravenous Once Truitt Merle, MD 110 mL/hr at 07/17/18 1302 125 mg at 07/17/18 1302  . heparin lock flush 100 unit/mL  500 Units Intracatheter Once PRN Truitt Merle, MD      . sodium chloride flush (NS) 0.9 % injection 10 mL  10 mL Intracatheter PRN Truitt Merle, MD      . sodium chloride flush (NS) 0.9 % injection 10 mL  10 mL Intracatheter PRN Truitt Merle, MD        PHYSICAL EXAMINATION: ECOG PERFORMANCE STATUS: 3 - Symptomatic, >50% confined to bed  Vitals:   07/17/18 1046  BP: 121/61  Pulse: 89  Resp: 17  Temp: 97.6 F (36.4 C)  SpO2: 100%   Filed Weights   07/17/18 1046  Weight: 274 lb 9.6 oz (124.6 kg)    GENERAL:alert, no distress and comfortable SKIN: skin color, texture, turgor are normal, no rashes or  significant lesions EYES: normal, Conjunctiva are pink and non-injected, sclera clear OROPHARYNX:no exudate, no erythema and lips, buccal mucosa, and tongue normal  NECK: supple, thyroid normal size, non-tender, without nodularity LYMPH:  no palpable lymphadenopathy in the cervical, axillary or inguinal LUNGS: clear to auscultation and percussion with normal breathing effort HEART: regular rate & rhythm and no murmurs and no lower extremity edema ABDOMEN:abdomen soft, non-tender and normal bowel sounds Musculoskeletal:no cyanosis of digits and no clubbing  NEURO: alert & oriented x 3 with fluent speech, no focal motor/sensory deficits  LABORATORY DATA:  I have reviewed the data as listed CBC Latest Ref Rng & Units 07/17/2018 07/08/2018 06/12/2018  WBC 4.0 - 10.5 K/uL 17.0(H) 12.6(H) 11.5(H)  Hemoglobin 12.0 - 15.0 g/dL 5.0(LL) 8.3(L) 9.5(L)  Hematocrit 36.0 - 46.0 % 18.0(L) 28.1(L) 32.7(L)  Platelets 150 - 400 K/uL 244 348 294     CMP Latest Ref Rng & Units 03/20/2018 02/19/2018 02/06/2018  Glucose 70 - 99 mg/dL  126(H) 148(H) 147(H)  BUN 6 - 20 mg/dL 11 9 11   Creatinine 0.44 - 1.00 mg/dL 0.86 0.85 0.98  Sodium 135 - 145 mmol/L 142 141 144  Potassium 3.5 - 5.1 mmol/L 3.6 3.7 3.6  Chloride 98 - 111 mmol/L 107 109 109  CO2 22 - 32 mmol/L 25 23 23   Calcium 8.9 - 10.3 mg/dL 8.8(L) 9.1 9.0  Total Protein 6.5 - 8.1 g/dL 7.3 7.6 7.3  Total Bilirubin 0.3 - 1.2 mg/dL 0.2(L) <0.2(L) 0.2(L)  Alkaline Phos 38 - 126 U/L 76 86 82  AST 15 - 41 U/L 11(L) 11(L) 10(L)  ALT 0 - 44 U/L 6 7 7       RADIOGRAPHIC STUDIES: I have personally reviewed the radiological images as listed and agreed with the findings in the report. No results found.   ASSESSMENT & PLAN:  LINDA GRIMMER is a 58 y.o. female with   1. 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 Extensive workup found the pt to have HHT based on her personal and familyhistoryand recurrentAVM GI bleedings. -Sheis s/pIR embolization of the left Gastric and short gastric artery branchvesselswithout evidence of residual flow in the Dieulafoy lesionon 12/10/2017. -[facility]here[facility] and then every 2 weeks9/13/19-11/8/19 -She is currently onTamoxifensince7/2019 for HHT  -She hashad recurrent GI bleeding and epistaxis again in December 2019, I have restarted Avastinin 03/2018. -She had PAC placed on 3/25/2 due to difficultly with peripheral veins.  -She missed her last treatment 2 weeks ago. She has felt very fatigues, nauseous and short of breath. I called in antiemetics for her nausea. I strongly encouraged her to contact us if she experienced significant symptoms.  -she has had melena again since 4 days ago  -Labs reviewed, CBC show WBC 17.8, Hg 5, ANC 13.8, low retic, urine protein <30. Her iron panel is still pending. Will proceed with Avastin and 1 unit RBCs today. Will return in 1 week for another unit of blood.  -Continue Tamoxifen daily.  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 -Continue Oral Fergon 1 tab TID. -She missed Avastin treatment 2 weeks ago. Today her Hemoglobin dropped to 5 (07/17/18). Will give IV iron, Avastin and 1 unit blood today.  3. Reactive Leukocytosis and reactive thrombocytosis  -She has mild leukocytosis with dominant neutrophils, likely reactive -Also has intermittent to moderate thrombocytosis, likely reactive to iron deficiency -continue monitoring -WBC at 17, ANC at 13.8 (07/17/18)    4. 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 -I recommend she continue to follow up with her GI, Dr. Cristina Gong -Her black stool has returned this week -Due to increased Epistaxis, I previously started her on Amicar1 g twice daily,she will pick up today (07/17/18)  5. Smoking cessation, Cough  -She smokes about 10 cigarettes a day  -Cough improved.  -Iagain recommendedshe try to cut back and eventually quit completely.     PLAN: -Continue Tamoxifen daily  -she will pick up Amicar  today -I prescribed antiemetics compazine today  -Labs reviewed, Hg at 5, will proceed with IV iron, Avastin and 1-2 unit RBCs today, due to current blood shortage from COVID 19 outbreak, we are not able to give blood transfusion unless Hg<7.0  -Lab in 5-7 days  -Lab flush and Avastin and iv ferric gluconate in 2, 4, 6 and 8 weeks  -F/u in 4 weeks    No problem-specific Assessment & Plan notes found for this encounter.   Orders Placed This Encounter  Procedures  . Practitioner attestation of consent    I, the ordering practitioner, attest that I have discussed with the patient the benefits, risks, side effects, alternatives, likelihood of achieving goals and potential problems during recovery for the procedure listed.    Standing Status:   Future    Standing Expiration Date:   07/17/2019    Order Specific Question:   Procedure    Answer:   Blood Product(s)  . Complete patient signature process for consent form    Standing Status:   Future    Standing Expiration Date:   07/17/2019  . Care order/instruction    Transfuse Parameters    Standing Status:   Future    Standing Expiration Date:   07/17/2019  . Care order/instruction    Transfuse Parameters    Standing Status:   Future    Standing Expiration Date:   07/17/2019  . Type and screen    Standing Status:   Future    Number of Occurrences:   1     Standing Expiration Date:   07/17/2019  . Prepare RBC    Standing Status:   Standing    Number of Occurrences:   1    Order Specific Question:   # of Units    Answer:   2 units    Order Specific Question:   Transfusion Indications    Answer:   Symptomatic Anemia    Order Specific Question:   If emergent release call blood bank    Answer:   Not emergent release   All questions were answered. The patient knows to call the clinic with any problems, questions or concerns. No barriers to learning was detected. I spent 15 minutes counseling the patient face to face. The total time spent in the appointment was 25 minutes and more than 50% was on counseling and review of test results     Truitt Merle, MD 07/17/2018   I, Joslyn Devon, am acting as scribe for Truitt Merle, MD.   I have reviewed the above documentation for accuracy and completeness, and I agree with the above.

## 2018-07-17 ENCOUNTER — Inpatient Hospital Stay: Payer: Medicaid Other | Attending: Hematology

## 2018-07-17 ENCOUNTER — Other Ambulatory Visit: Payer: Self-pay | Admitting: Hematology

## 2018-07-17 ENCOUNTER — Inpatient Hospital Stay (HOSPITAL_BASED_OUTPATIENT_CLINIC_OR_DEPARTMENT_OTHER): Payer: Medicaid Other | Admitting: Hematology

## 2018-07-17 ENCOUNTER — Telehealth: Payer: Self-pay | Admitting: *Deleted

## 2018-07-17 ENCOUNTER — Other Ambulatory Visit: Payer: Self-pay

## 2018-07-17 ENCOUNTER — Inpatient Hospital Stay: Payer: Medicaid Other

## 2018-07-17 ENCOUNTER — Encounter: Payer: Self-pay | Admitting: Hematology

## 2018-07-17 VITALS — BP 121/61 | HR 89 | Temp 97.6°F | Resp 17 | Ht 65.0 in | Wt 274.6 lb

## 2018-07-17 VITALS — BP 143/63 | HR 77 | Temp 98.6°F | Resp 16

## 2018-07-17 DIAGNOSIS — D500 Iron deficiency anemia secondary to blood loss (chronic): Secondary | ICD-10-CM | POA: Diagnosis not present

## 2018-07-17 DIAGNOSIS — I780 Hereditary hemorrhagic telangiectasia: Secondary | ICD-10-CM | POA: Diagnosis not present

## 2018-07-17 DIAGNOSIS — R05 Cough: Secondary | ICD-10-CM

## 2018-07-17 DIAGNOSIS — D72829 Elevated white blood cell count, unspecified: Secondary | ICD-10-CM

## 2018-07-17 DIAGNOSIS — F17210 Nicotine dependence, cigarettes, uncomplicated: Secondary | ICD-10-CM

## 2018-07-17 DIAGNOSIS — Z79899 Other long term (current) drug therapy: Secondary | ICD-10-CM

## 2018-07-17 DIAGNOSIS — M1990 Unspecified osteoarthritis, unspecified site: Secondary | ICD-10-CM | POA: Diagnosis not present

## 2018-07-17 DIAGNOSIS — Z95828 Presence of other vascular implants and grafts: Secondary | ICD-10-CM

## 2018-07-17 DIAGNOSIS — R040 Epistaxis: Secondary | ICD-10-CM

## 2018-07-17 DIAGNOSIS — D473 Essential (hemorrhagic) thrombocythemia: Secondary | ICD-10-CM

## 2018-07-17 DIAGNOSIS — Z5112 Encounter for antineoplastic immunotherapy: Secondary | ICD-10-CM | POA: Diagnosis not present

## 2018-07-17 DIAGNOSIS — Z79810 Long term (current) use of selective estrogen receptor modulators (SERMs): Secondary | ICD-10-CM

## 2018-07-17 LAB — CBC WITH DIFFERENTIAL (CANCER CENTER ONLY)
Abs Immature Granulocytes: 0.1 10*3/uL — ABNORMAL HIGH (ref 0.00–0.07)
Basophils Absolute: 0.1 10*3/uL (ref 0.0–0.1)
Basophils Relative: 0 %
Eosinophils Absolute: 0.1 10*3/uL (ref 0.0–0.5)
Eosinophils Relative: 1 %
HCT: 18 % — ABNORMAL LOW (ref 36.0–46.0)
Hemoglobin: 5 g/dL — CL (ref 12.0–15.0)
Immature Granulocytes: 1 %
Lymphocytes Relative: 14 %
Lymphs Abs: 2.3 10*3/uL (ref 0.7–4.0)
MCH: 24.4 pg — ABNORMAL LOW (ref 26.0–34.0)
MCHC: 27.8 g/dL — ABNORMAL LOW (ref 30.0–36.0)
MCV: 87.8 fL (ref 80.0–100.0)
Monocytes Absolute: 0.7 10*3/uL (ref 0.1–1.0)
Monocytes Relative: 4 %
Neutro Abs: 13.8 10*3/uL — ABNORMAL HIGH (ref 1.7–7.7)
Neutrophils Relative %: 80 %
Platelet Count: 244 10*3/uL (ref 150–400)
RBC: 2.05 MIL/uL — ABNORMAL LOW (ref 3.87–5.11)
RDW: 21.5 % — ABNORMAL HIGH (ref 11.5–15.5)
WBC Count: 17 10*3/uL — ABNORMAL HIGH (ref 4.0–10.5)
nRBC: 0.1 % (ref 0.0–0.2)

## 2018-07-17 LAB — IRON AND TIBC
Iron: 28 ug/dL — ABNORMAL LOW (ref 41–142)
Saturation Ratios: 9 % — ABNORMAL LOW (ref 21–57)
TIBC: 298 ug/dL (ref 236–444)
UIBC: 270 ug/dL (ref 120–384)

## 2018-07-17 LAB — RETICULOCYTES
Immature Retic Fract: 39.5 % — ABNORMAL HIGH (ref 2.3–15.9)
RBC.: 2.05 MIL/uL — ABNORMAL LOW (ref 3.87–5.11)
Retic Count, Absolute: 92 10*3/uL (ref 19.0–186.0)
Retic Ct Pct: 4.5 % — ABNORMAL HIGH (ref 0.4–3.1)

## 2018-07-17 LAB — FERRITIN: Ferritin: 18 ng/mL (ref 11–307)

## 2018-07-17 LAB — PREPARE RBC (CROSSMATCH)

## 2018-07-17 LAB — TOTAL PROTEIN, URINE DIPSTICK: Protein, ur: <30 mg/dL — AB

## 2018-07-17 MED ORDER — SODIUM CHLORIDE 0.9 % IV SOLN
5.3000 mg/kg | Freq: Once | INTRAVENOUS | Status: AC
  Administered 2018-07-17: 600 mg via INTRAVENOUS
  Filled 2018-07-17: qty 16

## 2018-07-17 MED ORDER — DIPHENHYDRAMINE HCL 25 MG PO CAPS: 25.0000 mg | ORAL_CAPSULE | Freq: Once | ORAL | Status: DC

## 2018-07-17 MED ORDER — SODIUM CHLORIDE 0.9% IV SOLUTION
250.0000 mL | Freq: Once | INTRAVENOUS | Status: AC
  Administered 2018-07-17: 250 mL via INTRAVENOUS
  Filled 2018-07-17: qty 250

## 2018-07-17 MED ORDER — ACETAMINOPHEN 325 MG PO TABS
ORAL_TABLET | ORAL | Status: AC
  Filled 2018-07-17: qty 2

## 2018-07-17 MED ORDER — PROCHLORPERAZINE MALEATE 10 MG PO TABS: 10.0000 mg | ORAL_TABLET | Freq: Four times a day (QID) | ORAL | 1 refills | Status: DC | PRN

## 2018-07-17 MED ORDER — SODIUM CHLORIDE 0.9% FLUSH
10.0000 mL | INTRAVENOUS | Status: DC | PRN
  Filled 2018-07-17: qty 10

## 2018-07-17 MED ORDER — SODIUM CHLORIDE 0.9 % IV SOLN
Freq: Once | INTRAVENOUS | Status: AC
  Administered 2018-07-17: 12:00:00 via INTRAVENOUS
  Filled 2018-07-17: qty 250

## 2018-07-17 MED ORDER — ACETAMINOPHEN 325 MG PO TABS
650.0000 mg | ORAL_TABLET | Freq: Once | ORAL | Status: AC
  Administered 2018-07-17: 650 mg via ORAL

## 2018-07-17 MED ORDER — LIDOCAINE-PRILOCAINE 2.5-2.5 % EX CREA: 1.0000 "application " | TOPICAL_CREAM | CUTANEOUS | 0 refills | Status: DC | PRN

## 2018-07-17 MED ORDER — SODIUM CHLORIDE 0.9% FLUSH
10.0000 mL | INTRAVENOUS | Status: AC | PRN
  Administered 2018-07-17: 10 mL
  Filled 2018-07-17: qty 10

## 2018-07-17 MED ORDER — DIPHENHYDRAMINE HCL 25 MG PO CAPS
ORAL_CAPSULE | ORAL | Status: AC
  Filled 2018-07-17: qty 1

## 2018-07-17 MED ORDER — DIPHENHYDRAMINE HCL 25 MG PO CAPS
25.0000 mg | ORAL_CAPSULE | Freq: Once | ORAL | Status: AC
  Administered 2018-07-17: 25 mg via ORAL

## 2018-07-17 MED ORDER — HEPARIN SOD (PORK) LOCK FLUSH 100 UNIT/ML IV SOLN
500.0000 [IU] | Freq: Once | INTRAVENOUS | Status: AC | PRN
  Administered 2018-07-17: 500 [IU]
  Filled 2018-07-17: qty 5

## 2018-07-17 MED ORDER — ACETAMINOPHEN 325 MG PO TABS: 650.0000 mg | ORAL_TABLET | Freq: Once | ORAL | Status: DC

## 2018-07-17 MED ORDER — SODIUM CHLORIDE 0.9 % IV SOLN
125.0000 mg | Freq: Once | INTRAVENOUS | Status: AC
  Administered 2018-07-17: 125 mg via INTRAVENOUS
  Filled 2018-07-17: qty 10

## 2018-07-17 NOTE — Patient Instructions (Addendum)
Coronavirus (COVID-19) Are you at risk?  Are you at risk for the Coronavirus (COVID-19)?  To be considered HIGH RISK for Coronavirus (COVID-19), you have to meet the following criteria:  . Traveled to [place], [place], [place], [place] or [place]; or in the [place] to [place], [place], [place], or [place]; and have fever, cough, and shortness of breath within the last 2 weeks of travel OR . Been in close contact with a person diagnosed with COVID-19 within the last 2 weeks and have fever, cough, and shortness of breath . IF YOU DO NOT MEET THESE CRITERIA, YOU ARE CONSIDERED LOW RISK FOR COVID-19.  What to do if you are HIGH RISK for COVID-19?  . If you are having a medical emergency, call 911. . Seek medical care right away. Before you go to a doctor's office, urgent care or emergency department, call ahead and tell them about your recent travel, contact with someone diagnosed with COVID-19, and your symptoms. You should receive instructions from your physician's office regarding next steps of care.  . When you arrive at healthcare provider, tell the healthcare staff immediately you have returned from visiting [place], [place], [place], [place] or [place]; or traveled in the [place] to [place], [place], [place], or [place]; in the last two weeks or you have been in close contact with a person diagnosed with COVID-19 in the last 2 weeks.   . Tell the health care staff about your symptoms: fever, cough and shortness of breath. . After you have been seen by a medical provider, you will be either: o Tested for (COVID-19) and discharged home on quarantine except to seek medical care if symptoms worsen, and asked to  - Stay home and avoid contact with others until you get your results (4-5 days)  - Avoid travel on public transportation if possible (such as bus, train, or airplane) or o Sent to the Emergency Department by EMS for evaluation, COVID-19 testing, and possible  admission depending on your condition and test results.  What to do if you are LOW RISK for COVID-19?  Reduce your risk of any infection by using the same precautions used for avoiding the common cold or flu:  . Wash your hands often with soap and warm water for at least 20 seconds.  If soap and water are not readily available, use an alcohol-based hand sanitizer with at least 60% alcohol.  . If coughing or sneezing, cover your mouth and nose by coughing or sneezing into the elbow areas of your shirt or coat, into a tissue or into your sleeve (not your hands). . Avoid shaking hands with others and consider head nods or verbal greetings only. . Avoid touching your eyes, nose, or mouth with unwashed hands.  . Avoid close contact with people who are sick. . Avoid places or events with large numbers of people in one location, like concerts or sporting events. . Carefully consider travel plans you have or are making. . If you are planning any travel outside or inside the [place], visit the CDC's Travelers' Health webpage for the latest health notices. . If you have some symptoms but not all symptoms, continue to monitor at home and seek medical attention if your symptoms worsen. . If you are having a medical emergency, call 911.   ADDITIONAL HEALTHCARE OPTIONS FOR PATIENTS  [facility] / e-Visit: [email or web address]         [facility]: [phone]  Onset   Urgent Care: [facility]: [facility] Discharge Instructions for Patients Receiving Chemotherapy  Today you received the following chemotherapy agents :  Avastin.  To help prevent nausea and vomiting after your treatment, we encourage you to take your nausea medication as prescribed.   If you develop nausea and vomiting that is not controlled by your nausea medication, call the clinic.   BELOW  ARE SYMPTOMS THAT SHOULD BE REPORTED IMMEDIATELY:  *FEVER GREATER THAN 100.5 F  *CHILLS WITH OR WITHOUT FEVER  NAUSEA AND VOMITING THAT IS NOT CONTROLLED WITH YOUR NAUSEA MEDICATION  *UNUSUAL SHORTNESS OF BREATH  *UNUSUAL BRUISING OR BLEEDING  TENDERNESS IN MOUTH AND THROAT WITH OR WITHOUT PRESENCE OF ULCERS  *URINARY PROBLEMS  *BOWEL PROBLEMS  UNUSUAL RASH Items with * indicate a potential emergency and should be followed up as soon as possible.  Feel free to call the clinic should you have any questions or concerns. The clinic phone number is [phone].  Please show the [facility] at check-in to the Emergency Department and triage nurse.   Sodium Ferric Gluconate Complex injection What is this medicine? SODIUM FERRIC GLUCONATE COMPLEX (SOE dee um FER ik GLOO koe nate KOM pleks) is an iron replacement. It is used with epoetin therapy to treat low iron levels in patients who are receiving hemodialysis. This medicine may be used for other purposes; ask your health care provider or pharmacist if you have questions. COMMON BRAND NAME(S): Ferrlecit, Nulecit What should I tell my health care provider before I take this medicine? They need to know if you have any of the following conditions: -anemia that is not from iron deficiency -high levels of iron in the body -an unusual or allergic reaction to iron, benzyl alcohol, other medicines, foods, dyes, or preservatives -pregnant or are trying to become pregnant -breast-feeding How should I use this medicine? This medicine is for infusion into a vein. It is given by a health care professional in a hospital or clinic setting. Talk to your pediatrician regarding the use of this medicine in children. While this drug may be prescribed for children as young as 52 years old for selected conditions, precautions do apply. Overdosage: If you think you have taken too much of this medicine contact a poison control center or emergency  room at once. NOTE: This medicine is only for you. Do not share this medicine with others. What if I miss a dose? It is important not to miss your dose. Call your doctor or health care professional if you are unable to keep an appointment. What may interact with this medicine? Do not take this medicine with any of the following medications: -deferoxamine -dimercaprol -other iron products This medicine may also interact with the following medications: -chloramphenicol -deferasirox -medicine for blood pressure like enalapril This list may not describe all possible interactions. Give your health care provider a list of all the medicines, herbs, non-prescription drugs, or dietary supplements you use. Also tell them if you smoke, drink alcohol, or use illegal drugs. Some items may interact with your medicine. What should I watch for while using this medicine? Your condition will be monitored carefully while you are receiving this medicine. Visit your doctor for check-ups as directed. What side effects may I notice from receiving this medicine? Side effects that you should report to your doctor or health  care professional as soon as possible: -allergic reactions like skin rash, itching or hives, swelling of the face, lips, or tongue -breathing problems -changes in hearing -changes in vision -chills, flushing, or sweating -fast, irregular heartbeat -feeling faint or lightheaded, falls -fever, flu-like symptoms -high or low blood pressure -pain, tingling, numbness in the hands or feet -severe pain in the chest, back, flanks, or groin -swelling of the ankles, feet, hands -trouble passing urine or change in the amount of urine -unusually weak or tired Side effects that usually do not require medical attention (report to your doctor or health care professional if they continue or are bothersome): -cramps -dark colored stools -diarrhea -headache -nausea, vomiting -stomach upset This list  may not describe all possible side effects. Call your doctor for medical advice about side effects. You may report side effects to FDA at 1-800-FDA-1088. Where should I keep my medicine? This drug is given in a hospital or clinic and will not be stored at home. NOTE: This sheet is a summary. It may not cover all possible information. If you have questions about this medicine, talk to your doctor, pharmacist, or health care provider.  2019 Elsevier/Gold Standard (2007-12-02 15:58:57)    Blood Transfusion, Adult, Care After This sheet gives you information about how to care for yourself after your procedure. Your doctor may also give you more specific instructions. If you have problems or questions, contact your doctor. Follow these instructions at home:   Take over-the-counter and prescription medicines only as told by your doctor.  Go back to your normal activities as told by your doctor.  Follow instructions from your doctor about how to take care of the area where an IV tube was put into your vein (insertion site). Make sure you: ? Wash your hands with soap and water before you change your bandage (dressing). If there is no soap and water, use hand sanitizer. ? Change your bandage as told by your doctor.  Check your IV insertion site every day for signs of infection. Check for: ? More redness, swelling, or pain. ? More fluid or blood. ? Warmth. ? Pus or a bad smell. Contact a doctor if:  You have more redness, swelling, or pain around the IV insertion site.  You have more fluid or blood coming from the IV insertion site.  Your IV insertion site feels warm to the touch.  You have pus or a bad smell coming from the IV insertion site.  Your pee (urine) turns pink, red, or brown.  You feel weak after doing your normal activities. Get help right away if:  You have signs of a serious allergic or body defense (immune) system reaction, including: ? Itchiness. ? Hives. ? Trouble  breathing. ? Anxiety. ? Pain in your chest or lower back. ? Fever, flushing, and chills. ? Fast pulse. ? Rash. ? Watery poop (diarrhea). ? Throwing up (vomiting). ? Dark pee. ? Serious headache. ? Dizziness. ? Stiff neck. ? Yellow color in your face or the white parts of your eyes (jaundice). Summary  After a blood transfusion, return to your normal activities as told by your doctor.  Every day, check for signs of infection where the IV tube was put into your vein.  Some signs of infection are warm skin, more redness and pain, more fluid or blood, and pus or a bad smell where the needle went in.  Contact your doctor if you feel weak or have any unusual symptoms. This information is not intended to  replace advice given to you by your health care provider. Make sure you discuss any questions you have with your health care provider. Document Released: 04/22/2014 Document Revised: 11/24/2015 Document Reviewed: 11/24/2015 Elsevier Interactive Patient Education  [employer].

## 2018-07-17 NOTE — Progress Notes (Signed)
Spoke with [place] in blood bank, patient will have CBC after one unit has been infused, if Hbg still below 7.0 can have another unit to be infused.  Cannot be done today in infusion however can be done tomorrow 4/4. Scheduling message was sent.  Order to draw CBC after one hour after unit of blood has infused has been placed.

## 2018-07-17 NOTE — Telephone Encounter (Signed)
Call report, critical lab result.  "[facility] Hgb = 5.0 g/dL.  We do not have orders for Type and Hold."   Secure chat message sent to provider and collab9rative.  10:46 am collaborative confirmed receipt and orders.

## 2018-07-18 ENCOUNTER — Inpatient Hospital Stay: Payer: Medicaid Other

## 2018-07-18 DIAGNOSIS — Z5112 Encounter for antineoplastic immunotherapy: Secondary | ICD-10-CM | POA: Diagnosis not present

## 2018-07-18 DIAGNOSIS — D500 Iron deficiency anemia secondary to blood loss (chronic): Secondary | ICD-10-CM

## 2018-07-18 LAB — PREPARE RBC (CROSSMATCH)

## 2018-07-18 MED ORDER — ACETAMINOPHEN 325 MG PO TABS
650.0000 mg | ORAL_TABLET | Freq: Once | ORAL | Status: AC
  Administered 2018-07-18: 650 mg via ORAL

## 2018-07-18 MED ORDER — DIPHENHYDRAMINE HCL 25 MG PO CAPS
ORAL_CAPSULE | ORAL | Status: AC
  Filled 2018-07-18: qty 1

## 2018-07-18 MED ORDER — SODIUM CHLORIDE 0.9% IV SOLUTION
250.0000 mL | Freq: Once | INTRAVENOUS | Status: AC
  Administered 2018-07-18: 250 mL via INTRAVENOUS
  Filled 2018-07-18: qty 250

## 2018-07-18 MED ORDER — SODIUM CHLORIDE 0.9% FLUSH
10.0000 mL | INTRAVENOUS | Status: AC | PRN
  Administered 2018-07-18: 10 mL
  Filled 2018-07-18: qty 10

## 2018-07-18 MED ORDER — HEPARIN SOD (PORK) LOCK FLUSH 100 UNIT/ML IV SOLN
500.0000 [IU] | Freq: Every day | INTRAVENOUS | Status: AC | PRN
  Administered 2018-07-18: 500 [IU]
  Filled 2018-07-18: qty 5

## 2018-07-18 MED ORDER — ACETAMINOPHEN 325 MG PO TABS
ORAL_TABLET | ORAL | Status: AC
  Filled 2018-07-18: qty 2

## 2018-07-18 MED ORDER — DIPHENHYDRAMINE HCL 25 MG PO CAPS
25.0000 mg | ORAL_CAPSULE | Freq: Once | ORAL | Status: AC
  Administered 2018-07-18: 25 mg via ORAL

## 2018-07-18 NOTE — Patient Instructions (Signed)
Blood Transfusion, Adult A blood transfusion is a procedure in which you are given blood through an IV tube. You may need this procedure because of:  Illness.  Surgery.  Injury. The blood may come from someone else (a donor). You may also be able to donate blood for yourself (autologous blood donation). The blood given in a transfusion is made up of different types of cells. You may get:  Red blood cells. These carry oxygen to the cells in the body.  White blood cells. These help you fight infections.  Platelets. These help your blood to clot.  Plasma. This is the liquid part of your blood. It helps with fluid imbalances. If you have a clotting disorder, you may also get other types of blood products. What happens before the procedure?  You will have a blood test to find out your blood type. The test also finds out what type of blood your body will accept and matches it to the donor type.  If you are going to have a planned surgery, you may be able to donate your own blood. This may be done in case you need a transfusion.  If you have had an allergic reaction to a transfusion in the past, you may be given medicine to help prevent a reaction. This medicine may be given to you by mouth or through an IV.  You will have your temperature, blood pressure, and pulse checked.  Follow instructions from your doctor about what you cannot eat or drink.  Ask your doctor about: ? Changing or stopping your regular medicines. This is important if you take diabetes medicines or blood thinners. ? Taking medicines such as aspirin and ibuprofen. These medicines can thin your blood. Do not take these medicines before your procedure if your doctor tells you not to. What happens during the procedure?  An IV tube will be put into one of your veins.  The bag of donated blood will be attached to your IV tube. Then, the blood will enter through your vein.  Your temperature, blood pressure, and pulse will  be checked regularly during the procedure. This is done to find early signs of a transfusion reaction.  If you have any signs or symptoms of a reaction, your transfusion will be stopped. You may also be given medicine.  When the transfusion is done, your IV tube will be taken out.  Pressure may be applied to the IV site for a few minutes.  A bandage (dressing) will be put on the IV site. The procedure may vary among doctors and hospitals. What happens after the procedure?  Your temperature, blood pressure, heart rate, breathing rate, and blood oxygen level will be checked often.  Your blood may be tested to see how you are responding to the transfusion.  You may be warmed with fluids or blankets. This is done to keep the temperature of your body normal. Summary  A blood transfusion is a procedure in which you are given blood through an IV tube.  The blood may come from someone else (a donor). You may also be able to donate blood for yourself.  If you have had an allergic reaction to a transfusion in the past, you may be given medicine to help prevent a reaction. This medicine may be given to you by mouth or through an IV tube.  Your temperature, blood pressure, heart rate, breathing rate, and blood oxygen level will be checked often.  Your blood may be tested to see   how you are responding to the transfusion. This information is not intended to replace advice given to you by your health care provider. Make sure you discuss any questions you have with your health care provider. Document Released: 06/28/2008 Document Revised: 11/24/2015 Document Reviewed: 11/24/2015 Elsevier Interactive Patient Education  2019 Elsevier Inc.  

## 2018-07-20 ENCOUNTER — Telehealth: Payer: Self-pay | Admitting: Hematology

## 2018-07-20 ENCOUNTER — Other Ambulatory Visit: Payer: Self-pay

## 2018-07-20 DIAGNOSIS — M1990 Unspecified osteoarthritis, unspecified site: Secondary | ICD-10-CM | POA: Diagnosis not present

## 2018-07-20 DIAGNOSIS — D500 Iron deficiency anemia secondary to blood loss (chronic): Secondary | ICD-10-CM

## 2018-07-20 LAB — TYPE AND SCREEN
ABO/RH(D): A NEG
Antibody Screen: NEGATIVE
Unit division: 0
Unit division: 0

## 2018-07-20 LAB — BPAM RBC
Blood Product Expiration Date: 202004092359
Blood Product Expiration Date: 202004152359
ISSUE DATE / TIME: 202004031445
ISSUE DATE / TIME: 202004040959
Unit Type and Rh: 600
Unit Type and Rh: 600

## 2018-07-20 NOTE — Telephone Encounter (Signed)
Scheduled appt per 4/3 los.  Left a voice message of upcoming appt.

## 2018-07-21 DIAGNOSIS — M1990 Unspecified osteoarthritis, unspecified site: Secondary | ICD-10-CM | POA: Diagnosis not present

## 2018-07-22 DIAGNOSIS — M1990 Unspecified osteoarthritis, unspecified site: Secondary | ICD-10-CM | POA: Diagnosis not present

## 2018-07-23 DIAGNOSIS — M1990 Unspecified osteoarthritis, unspecified site: Secondary | ICD-10-CM | POA: Diagnosis not present

## 2018-07-24 ENCOUNTER — Inpatient Hospital Stay: Payer: Medicaid Other

## 2018-07-24 DIAGNOSIS — M1990 Unspecified osteoarthritis, unspecified site: Secondary | ICD-10-CM | POA: Diagnosis not present

## 2018-07-27 DIAGNOSIS — M1990 Unspecified osteoarthritis, unspecified site: Secondary | ICD-10-CM | POA: Diagnosis not present

## 2018-07-28 DIAGNOSIS — M1990 Unspecified osteoarthritis, unspecified site: Secondary | ICD-10-CM | POA: Diagnosis not present

## 2018-07-29 DIAGNOSIS — M1990 Unspecified osteoarthritis, unspecified site: Secondary | ICD-10-CM | POA: Diagnosis not present

## 2018-07-30 DIAGNOSIS — M1990 Unspecified osteoarthritis, unspecified site: Secondary | ICD-10-CM | POA: Diagnosis not present

## 2018-07-31 ENCOUNTER — Inpatient Hospital Stay: Payer: Medicaid Other

## 2018-07-31 DIAGNOSIS — M1990 Unspecified osteoarthritis, unspecified site: Secondary | ICD-10-CM | POA: Diagnosis not present

## 2018-08-03 DIAGNOSIS — M1990 Unspecified osteoarthritis, unspecified site: Secondary | ICD-10-CM | POA: Diagnosis not present

## 2018-08-04 ENCOUNTER — Other Ambulatory Visit: Payer: Self-pay | Admitting: Hematology

## 2018-08-04 DIAGNOSIS — M1990 Unspecified osteoarthritis, unspecified site: Secondary | ICD-10-CM | POA: Diagnosis not present

## 2018-08-04 DIAGNOSIS — F411 Generalized anxiety disorder: Secondary | ICD-10-CM

## 2018-08-05 DIAGNOSIS — M1990 Unspecified osteoarthritis, unspecified site: Secondary | ICD-10-CM | POA: Diagnosis not present

## 2018-08-06 DIAGNOSIS — M1990 Unspecified osteoarthritis, unspecified site: Secondary | ICD-10-CM | POA: Diagnosis not present

## 2018-08-07 DIAGNOSIS — M1990 Unspecified osteoarthritis, unspecified site: Secondary | ICD-10-CM | POA: Diagnosis not present

## 2018-08-10 DIAGNOSIS — M1990 Unspecified osteoarthritis, unspecified site: Secondary | ICD-10-CM | POA: Diagnosis not present

## 2018-08-11 DIAGNOSIS — M1990 Unspecified osteoarthritis, unspecified site: Secondary | ICD-10-CM | POA: Diagnosis not present

## 2018-08-12 DIAGNOSIS — M1990 Unspecified osteoarthritis, unspecified site: Secondary | ICD-10-CM | POA: Diagnosis not present

## 2018-08-13 ENCOUNTER — Other Ambulatory Visit: Payer: Self-pay

## 2018-08-13 DIAGNOSIS — M1990 Unspecified osteoarthritis, unspecified site: Secondary | ICD-10-CM | POA: Diagnosis not present

## 2018-08-13 DIAGNOSIS — D500 Iron deficiency anemia secondary to blood loss (chronic): Secondary | ICD-10-CM

## 2018-08-13 NOTE — Progress Notes (Signed)
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 She presents to the clinic today by herself.  She notes she did not come to appointment 2 weeks ago because she did not feel well. She notes she has 1 episode of nose bleeding this morning, but not significantly. She notes she has been having diarrhea or runny stool over 2 days. She denies blood stool. She denies fever, but notes being very cold at home. She notes she is not able to do much at home because she will be SOB. She denies chest pain. She notes a mild cough which she attributed to allergies. She declines going to ED.  She notes she is taking Amicar 1 g BID. She notes she is not taking an iron pill.    REVIEW OF SYSTEMS:   Constitutional: Denies fevers, chills or abnormal weight loss (+) fatigue  Eyes: Denies blurriness of vision Ears, nose, mouth, throat, and face: Denies mucositis or sore throat (+) Epistaxis, 1 episode   Respiratory: Denies cough or wheezes (+) SOB with activity  Cardiovascular: Denies  palpitation, chest discomfort or lower extremity swelling Gastrointestinal:  Denies nausea, heartburn or change in bowel habits Skin: Denies abnormal skin rashes Lymphatics: Denies new lymphadenopathy or easy bruising Neurological:Denies numbness, tingling or new weaknesses Behavioral/Psych: Mood is stable, no new changes  All other systems were reviewed with the patient and are negative.  MEDICAL HISTORY:  Past Medical History:  Diagnosis Date  . Anxiety   . Arthritis    knnes,back  . GERD (gastroesophageal reflux disease)   . 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 History of swelling of feet   . Hyperlipidemia   . Hypertension   . Major depressive disorder, recurrent episode ([place]) 06/05/2015  . Obesity   . Snores     SURGICAL HISTORY: Past Surgical History:  Procedure Laterality Date  . ABDOMINAL HYSTERECTOMY    . CARPAL TUNNEL RELEASE  05/13/2011   Procedure: CARPAL TUNNEL RELEASE;  Surgeon: Nita Sells, MD;  Location: [place];  Service: Orthopedics;  Laterality: Left;  . COLONOSCOPY WITH PROPOFOL N/A 04/28/2014   Procedure: COLONOSCOPY WITH PROPOFOL;  Surgeon: Cleotis Nipper, MD;  Location: [facility] ENDOSCOPY;  Service: Endoscopy;  Laterality: N/A;  . DG TOES*L*  2/10   rt  . DILATION AND CURETTAGE OF UTERUS    . ENTEROSCOPY N/A 10/17/2017   Procedure: ENTEROSCOPY;  Surgeon: Otis Brace, MD;  Location: [place] ENDOSCOPY;  Service: Gastroenterology;  Laterality: N/A;  . ESOPHAGOGASTRODUODENOSCOPY N/A 04/10/2014   Procedure: ESOPHAGOGASTRODUODENOSCOPY (EGD);  Surgeon: [name], MD;  Location: [facility] ENDOSCOPY;  Service: Endoscopy;  Laterality: N/A;  . ESOPHAGOGASTRODUODENOSCOPY N/A 05/10/2017   Procedure: ESOPHAGOGASTRODUODENOSCOPY (EGD);  Surgeon: Ronald Lobo, MD;  Location: [facility] ENDOSCOPY;  Service: Endoscopy;  Laterality: N/A;  . ESOPHAGOGASTRODUODENOSCOPY N/A 09/22/2017   Procedure: ESOPHAGOGASTRODUODENOSCOPY (EGD);  Surgeon:  Clarene Essex, MD;  Location: [place];  Service: Endoscopy;  Laterality: N/A;  bedside  . ESOPHAGOGASTRODUODENOSCOPY (EGD) WITH PROPOFOL N/A 04/27/2014   Procedure: ESOPHAGOGASTRODUODENOSCOPY (EGD) WITH PROPOFOL;  Surgeon: Cleotis Nipper, MD;  Location: [place];  Service: Endoscopy;  Laterality: N/A;  possible apc  . ESOPHAGOGASTRODUODENOSCOPY (EGD) WITH PROPOFOL N/A 09/30/2017   Procedure: ESOPHAGOGASTRODUODENOSCOPY (EGD) WITH PROPOFOL;  Surgeon: [name], MD;  Location: [place];  Service: Gastroenterology;  Laterality: N/A;  . ESOPHAGOGASTRODUODENOSCOPY (EGD) WITH PROPOFOL N/A 10/01/2017   Procedure: ESOPHAGOGASTRODUODENOSCOPY (EGD) WITH PROPOFOL;  Surgeon: [name], MD;  Location: [place];  Service: Gastroenterology;  Laterality: N/A;  . ESOPHAGOGASTRODUODENOSCOPY (EGD) WITH PROPOFOL N/A 10/08/2017   Procedure: ESOPHAGOGASTRODUODENOSCOPY (EGD) WITH PROPOFOL;  Surgeon: Otis Brace, MD;  Location: [facility] ENDOSCOPY;  Service: Gastroenterology;  Laterality: N/A;  . ESOPHAGOGASTRODUODENOSCOPY (EGD) WITH PROPOFOL N/A 10/17/2017   Procedure: ESOPHAGOGASTRODUODENOSCOPY (EGD) WITH PROPOFOL;  Surgeon: Otis Brace, MD;  Location: [facility] ENDOSCOPY;  Service: Gastroenterology;  Laterality: N/A;  . ESOPHAGOGASTRODUODENOSCOPY (EGD) WITH PROPOFOL N/A 10/19/2017   Procedure: ESOPHAGOGASTRODUODENOSCOPY (EGD) WITH PROPOFOL;  Surgeon: Otis Brace, MD;  Location: [facility] ENDOSCOPY;  Service: Gastroenterology;  Laterality: N/A;  . GIVENS CAPSULE STUDY N/A 10/02/2017   Procedure: GIVENS CAPSULE STUDY;  Surgeon: [name], MD;  Location: [place];  Service: Gastroenterology;  Laterality: N/A;  . GIVENS CAPSULE STUDY N/A 10/08/2017   Procedure: GIVENS CAPSULE STUDY;  Surgeon: Otis Brace, MD;  Location: [place];  Service: Gastroenterology;  Laterality: N/A;  endoscopic placement of capsule  . HOT HEMOSTASIS N/A 04/27/2014   Procedure: HOT HEMOSTASIS (ARGON PLASMA COAGULATION/BICAP);   Surgeon: Cleotis Nipper, MD;  Location: [facility] ENDOSCOPY;  Service: Endoscopy;  Laterality: N/A;  . HOT HEMOSTASIS N/A 09/30/2017   Procedure: HOT HEMOSTASIS (ARGON PLASMA COAGULATION/BICAP);  Surgeon: [name], MD;  Location: [place];  Service: Gastroenterology;  Laterality: N/A;  . HOT HEMOSTASIS N/A 10/01/2017   Procedure: HOT HEMOSTASIS (ARGON PLASMA COAGULATION/BICAP);  Surgeon: [name], MD;  Location: [place];  Service: Gastroenterology;  Laterality: N/A;  . HOT HEMOSTASIS N/A 10/17/2017   Procedure: HOT HEMOSTASIS (ARGON PLASMA COAGULATION/BICAP);  Surgeon: Otis Brace, MD;  Location: [facility] ENDOSCOPY;  Service: Gastroenterology;  Laterality: N/A;  . HOT HEMOSTASIS N/A 10/19/2017   Procedure: HOT HEMOSTASIS (ARGON PLASMA COAGULATION/BICAP);  Surgeon: Otis Brace, MD;  Location: [facility] ENDOSCOPY;  Service: Gastroenterology;  Laterality: N/A;  . IR IMAGING GUIDED PORT INSERTION  07/08/2018  . L shoulder Surgery  2011  . SUBMUCOSAL INJECTION  09/22/2017   Procedure: SUBMUCOSAL INJECTION;  Surgeon: Clarene Essex, MD;  Location: [facility] ENDOSCOPY;  Service: Endoscopy;;    I have reviewed the social history and family history with the patient and they are unchanged from previous note.  ALLERGIES:  is allergic to feraheme [ferumoxytol]; nsaids; tomato; and wasp venom.  MEDICATIONS:  Current Outpatient Medications  Medication Sig Dispense Refill  . albuterol (PROVENTIL HFA;VENTOLIN HFA) 108 (90 Base) MCG/ACT inhaler Inhale 1-2 puffs into the lungs every 6 (six) hours as needed for wheezing or shortness of breath. 1 Inhaler 3  . ALPRAZolam (XANAX) 0.5 MG tablet Take 1 tablet (0.5 mg total) by mouth 2 (two) times daily as needed for anxiety. 60 tablet 1  . Aminocaproic Acid (AMICAR) 1000 MG  TABS Take 1 tablet (1,000 mg total) by mouth 2 (two) times daily. 60 each 1  . amLODipine (NORVASC) 10 MG tablet Take 1 tablet (10 mg total) by mouth daily. 90 tablet 0  . atorvastatin (LIPITOR) 80 MG  tablet TAKE 1 TABLET(80 MG) BY MOUTH DAILY 30 tablet 3  . hydrochlorothiazide (HYDRODIURIL) 12.5 MG tablet TAKE 1 TABLET(12.5 MG) BY MOUTH DAILY 90 tablet 0  . lidocaine-prilocaine (EMLA) cream Apply 1 application topically as needed. 30 g 0  . pantoprazole (PROTONIX) 40 MG tablet Take 1 tablet (40 mg total) by mouth daily. 30 tablet 3  . prochlorperazine (COMPAZINE) 10 MG tablet Take 1 tablet (10 mg total) by mouth every 6 (six) hours as needed for nausea or vomiting. 30 tablet 1  . sertraline (ZOLOFT) 50 MG tablet TAKE 1 TABLET(50 MG) BY MOUTH DAILY 90 tablet 0  . tamoxifen (NOLVADEX) 20 MG tablet Take 1 tablet (20 mg total) by mouth daily. 90 tablet 3  . ferrous gluconate (FERGON) 324 MG tablet Take 1 tablet (324 mg total) by mouth 3 (three) times daily with meals. 90 tablet 0   No current facility-administered medications for this visit.    Facility-Administered Medications Ordered in Other Visits  Medication Dose Route Frequency Provider Last Rate Last Dose  . 0.9 %  sodium chloride infusion   Intravenous Once Truitt Merle, MD      . heparin lock flush 100 unit/mL  500 Units Intracatheter Once PRN Truitt Merle, MD      . sodium chloride flush (NS) 0.9 % injection 10 mL  10 mL Intracatheter PRN Truitt Merle, MD        PHYSICAL EXAMINATION: ECOG PERFORMANCE STATUS: 3 - Symptomatic, >50% confined to bed  Vitals:   08/14/18 1038  BP: 123/66  Pulse: 87  Resp: 20  Temp: 97.9 F (36.6 C)  SpO2: 100%   Filed Weights   08/14/18 1038  Weight: 272 lb 9.6 oz (123.7 kg)    GENERAL:alert, no distress and comfortable SKIN: skin color, texture, turgor are normal, no rashes or significant lesions EYES: normal, Conjunctiva are pink and non-injected, sclera clear OROPHARYNX:no exudate, no erythema and lips, buccal mucosa, and tongue normal  NECK: supple, thyroid normal size, non-tender, without nodularity LYMPH:  no palpable lymphadenopathy in the cervical, axillary or inguinal LUNGS: clear to  auscultation and percussion with normal breathing effort HEART: regular rate & rhythm and no murmurs and no lower extremity edema ABDOMEN:abdomen soft, non-tender and normal bowel sounds Musculoskeletal:no cyanosis of digits and no clubbing  NEURO: alert & oriented x 3 with fluent speech, no focal motor/sensory deficits  LABORATORY DATA:  I have reviewed the data as listed CBC Latest Ref Rng & Units 08/14/2018 07/17/2018 07/08/2018  WBC 4.0 - 10.5 K/uL 10.6(H) 17.0(H) 12.6(H)  Hemoglobin 12.0 - 15.0 g/dL 4.1(LL) 5.0(LL) 8.3(L)  Hematocrit 36.0 - 46.0 % 14.6(L) 18.0(L) 28.1(L)  Platelets 150 - 400 K/uL 233 244 348     CMP Latest Ref Rng & Units 03/20/2018 02/19/2018 02/06/2018  Glucose 70 - 99 mg/dL 126(H) 148(H) 147(H)  BUN 6 - 20 mg/dL 11 9 11   Creatinine 0.44 - 1.00 mg/dL 0.86 0.85 0.98  Sodium 135 - 145 mmol/L 142 141 144  Potassium 3.5 - 5.1 mmol/L 3.6 3.7 3.6  Chloride 98 - 111 mmol/L 107 109 109  CO2 22 - 32 mmol/L 25 23 23   Calcium 8.9 - 10.3 mg/dL 8.8(L) 9.1 9.0  Total Protein 6.5 - 8.1 g/dL 7.3 7.6 7.3  Total Bilirubin 0.3 - 1.2 mg/dL 0.2(L) <0.2(L) 0.2(L)  Alkaline Phos 38 - 126 U/L 76 86 82  AST 15 - 41 U/L 11(L) 11(L) 10(L)  ALT 0 - 44 U/L 6 7 7       RADIOGRAPHIC STUDIES: I have personally reviewed the radiological images as listed and agreed with the findings in the report. No results found.   ASSESSMENT & PLAN:  Peggy House is a 57 y.o. 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 Extensive workup found the pt to have HHT based on her personal and familyhistoryand recurrentAVM GI bleedings. -Sheis s/pIR embolization of the left Gastric and short gastric artery branchvesselswithout evidence of residual flow in the Dieulafoy lesionon 12/10/2017.  -ShereceivedAv[facility] and then every 2 weeks9/13/19-11/8/19 -She is currently onTamoxifensince7/2019 for HHT  -She hashad recurrent GI bleeding and epistaxis again in December 2019, I have restarted IV iron, Avastinin 03/2018.I also started her on Amicar 1g BID starting 07/17/18.  -She again missed her last treatment 2 weeks again and has not been seen in 1 month. She has felt very fatigues and short of breath. She denies blood in stool.  -I offered her the chance to be monitored in the hospital. She declined. I called her son to let him know of her condition and he agreed to monitor her and make sure she comes to her appointments.  -Labs reviewed, trace protein in urine . WBC 10.6, Hg 4.1. iron panel is still pending. Will give Avastin, iv ferric gluconate and 1 unit blood today. Will repeat blood counts afterward. If still below 7.0, I will given another unit of blood transfusion tomorrow. -Continue Tamoxifen and Amicar daily.  -For close monitoring I will f/u with labs on 5/4 and 5/8, then weekly for 1 month.   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 -She note she is no longer taking Oral Fergon 1 tab TID -Today her Hemoglobin dropped to 4.1 (08/14/18). Will give IV iron, Avastin and 1 unit blood today.  3. Reactive Leukocytosis and reactive thrombocytosis  -She has mild leukocytosis with dominant neutrophils, likely reactive -Also has intermittent to moderate thrombocytosis, likely reactive to iron deficiency -continue monitoring -WBC at10.6 (08/14/18)  4. 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 -I recommend she continue to follow up with her GI, Dr. Cristina Gong -She denies blood in stool this week.  -Due to  increasedEpistaxis, I previouslystarted her onAmicar1 g twice daily on 07/17/18 -She only had 1 episode moderate epistaxis this morning.   5. Smoking cessation, Cough  -She smokes about 10 cigarettes a day  -Ipreviously recommendedshe try to cut backand eventually quitcompletely.  -Mild cough stable. She attributes this to current allergies.    PLAN: -Labs reviewed, Hg at 4.1, ferritin 9. Will proceed with IV iron, Avastin and I unit blood today  -Repeat CBC today after blood transfusion, if Hg<7.0, will give one more unit blood tomorrow  -iv ferric gluconate weekly until ferritin >100 -Avastin every 2 weeks -F/u 5/15 and 5/29  -Continue Tamoxifen and Amicar daily    No problem-specific Assessment & Plan notes found for  this encounter.   Orders Placed This Encounter  Procedures  . Practitioner attestation of consent    I, the ordering practitioner, attest that I have discussed with the patient the benefits, risks, side effects, alternatives, likelihood of achieving goals and potential problems during recovery for the procedure listed.    Standing Status:   Future    Standing Expiration Date:   08/14/2019    Order Specific Question:   Procedure    Answer:   Blood Product(s)  . Complete patient signature process for consent form    Standing Status:   Future    Standing Expiration Date:   08/14/2019  . Care order/instruction    Transfuse Parameters    Standing Status:   Future    Standing Expiration Date:   08/14/2019  . Type and screen    Standing Status:   Future    Number of Occurrences:   1    Standing Expiration Date:   08/14/2019  . Prepare RBC    Standing Status:   Standing    Number of Occurrences:   1    Order Specific Question:   # of Units    Answer:   2 units    Order Specific Question:   Transfusion Indications    Answer:   Symptomatic Anemia    Order Specific Question:   If emergent release call blood bank    Answer:   Not emergent release   All  questions were answered. The patient knows to call the clinic with any problems, questions or concerns. No barriers to learning was detected. I spent 20 minutes counseling the patient face to face. The total time spent in the appointment was 25 minutes and more than 50% was on counseling and review of test results     Truitt Merle, MD 08/14/2018   I, Joslyn Devon, am acting as scribe for Truitt Merle, MD.   I have reviewed the above documentation for accuracy and completeness, and I agree with the above.

## 2018-08-14 ENCOUNTER — Other Ambulatory Visit: Payer: Self-pay

## 2018-08-14 ENCOUNTER — Inpatient Hospital Stay (HOSPITAL_BASED_OUTPATIENT_CLINIC_OR_DEPARTMENT_OTHER): Payer: Medicaid Other | Admitting: Hematology

## 2018-08-14 ENCOUNTER — Ambulatory Visit: Payer: Medicaid Other

## 2018-08-14 ENCOUNTER — Telehealth: Payer: Self-pay | Admitting: Hematology

## 2018-08-14 ENCOUNTER — Inpatient Hospital Stay: Payer: Medicaid Other | Attending: Hematology

## 2018-08-14 ENCOUNTER — Inpatient Hospital Stay: Payer: Medicaid Other

## 2018-08-14 ENCOUNTER — Encounter: Payer: Self-pay | Admitting: Hematology

## 2018-08-14 VITALS — BP 123/66 | HR 87 | Temp 97.9°F | Resp 20 | Ht 65.0 in | Wt 272.6 lb

## 2018-08-14 VITALS — BP 146/73 | HR 68 | Temp 98.6°F | Resp 20

## 2018-08-14 DIAGNOSIS — I10 Essential (primary) hypertension: Secondary | ICD-10-CM

## 2018-08-14 DIAGNOSIS — I780 Hereditary hemorrhagic telangiectasia: Secondary | ICD-10-CM | POA: Insufficient documentation

## 2018-08-14 DIAGNOSIS — D72828 Other elevated white blood cell count: Secondary | ICD-10-CM | POA: Diagnosis not present

## 2018-08-14 DIAGNOSIS — F17210 Nicotine dependence, cigarettes, uncomplicated: Secondary | ICD-10-CM | POA: Diagnosis not present

## 2018-08-14 DIAGNOSIS — R05 Cough: Secondary | ICD-10-CM | POA: Insufficient documentation

## 2018-08-14 DIAGNOSIS — D500 Iron deficiency anemia secondary to blood loss (chronic): Secondary | ICD-10-CM

## 2018-08-14 DIAGNOSIS — Z79899 Other long term (current) drug therapy: Secondary | ICD-10-CM | POA: Diagnosis not present

## 2018-08-14 DIAGNOSIS — Z5112 Encounter for antineoplastic immunotherapy: Secondary | ICD-10-CM | POA: Insufficient documentation

## 2018-08-14 DIAGNOSIS — D508 Other iron deficiency anemias: Secondary | ICD-10-CM

## 2018-08-14 DIAGNOSIS — M1990 Unspecified osteoarthritis, unspecified site: Secondary | ICD-10-CM | POA: Diagnosis not present

## 2018-08-14 LAB — CBC WITH DIFFERENTIAL (CANCER CENTER ONLY)
Abs Immature Granulocytes: 0.07 10*3/uL (ref 0.00–0.07)
Basophils Absolute: 0 10*3/uL (ref 0.0–0.1)
Basophils Relative: 0 %
Eosinophils Absolute: 0.1 10*3/uL (ref 0.0–0.5)
Eosinophils Relative: 1 %
HCT: 14.6 % — ABNORMAL LOW (ref 36.0–46.0)
Hemoglobin: 4.1 g/dL — CL (ref 12.0–15.0)
Immature Granulocytes: 1 %
Lymphocytes Relative: 13 %
Lymphs Abs: 1.4 10*3/uL (ref 0.7–4.0)
MCH: 22.2 pg — ABNORMAL LOW (ref 26.0–34.0)
MCHC: 28.1 g/dL — ABNORMAL LOW (ref 30.0–36.0)
MCV: 78.9 fL — ABNORMAL LOW (ref 80.0–100.0)
Monocytes Absolute: 0.4 10*3/uL (ref 0.1–1.0)
Monocytes Relative: 4 %
Neutro Abs: 8.7 10*3/uL — ABNORMAL HIGH (ref 1.7–7.7)
Neutrophils Relative %: 81 %
Platelet Count: 233 10*3/uL (ref 150–400)
RBC: 1.85 MIL/uL — ABNORMAL LOW (ref 3.87–5.11)
RDW: 22.9 % — ABNORMAL HIGH (ref 11.5–15.5)
WBC Count: 10.6 10*3/uL — ABNORMAL HIGH (ref 4.0–10.5)
nRBC: 0 % (ref 0.0–0.2)

## 2018-08-14 LAB — SAMPLE TO BLOOD BANK

## 2018-08-14 LAB — IRON AND TIBC
Iron: 5 ug/dL — ABNORMAL LOW (ref 41–142)
Saturation Ratios: 1 % — ABNORMAL LOW (ref 21–57)
TIBC: 370 ug/dL (ref 236–444)
UIBC: 365 ug/dL (ref 120–384)

## 2018-08-14 LAB — TOTAL PROTEIN, URINE DIPSTICK

## 2018-08-14 LAB — FERRITIN: Ferritin: 9 ng/mL — ABNORMAL LOW (ref 11–307)

## 2018-08-14 LAB — HEMOGLOBIN AND HEMATOCRIT (CANCER CENTER ONLY)
HCT: 17 % — ABNORMAL LOW (ref 36.0–46.0)
Hemoglobin: 5 g/dL — CL (ref 12.0–15.0)

## 2018-08-14 LAB — RETICULOCYTES
Immature Retic Fract: 14.4 % (ref 2.3–15.9)
RBC.: 1.83 MIL/uL — ABNORMAL LOW (ref 3.87–5.11)
Retic Count, Absolute: 24.2 10*3/uL (ref 19.0–186.0)
Retic Ct Pct: 1.3 % (ref 0.4–3.1)

## 2018-08-14 LAB — PREPARE RBC (CROSSMATCH)

## 2018-08-14 MED ORDER — SODIUM CHLORIDE 0.9 % IV SOLN
125.0000 mg | Freq: Once | INTRAVENOUS | Status: AC
  Administered 2018-08-14: 125 mg via INTRAVENOUS
  Filled 2018-08-14: qty 10

## 2018-08-14 MED ORDER — DIPHENHYDRAMINE HCL 25 MG PO CAPS
ORAL_CAPSULE | ORAL | Status: AC
  Filled 2018-08-14: qty 1

## 2018-08-14 MED ORDER — DIPHENHYDRAMINE HCL 25 MG PO CAPS
25.0000 mg | ORAL_CAPSULE | Freq: Once | ORAL | Status: AC
  Administered 2018-08-14: 25 mg via ORAL

## 2018-08-14 MED ORDER — SODIUM CHLORIDE 0.9% FLUSH
10.0000 mL | INTRAVENOUS | Status: DC | PRN
  Administered 2018-08-14: 10 mL
  Filled 2018-08-14: qty 10

## 2018-08-14 MED ORDER — SODIUM CHLORIDE 0.9% FLUSH
10.0000 mL | Freq: Once | INTRAVENOUS | Status: AC | PRN
  Administered 2018-08-14: 10:00:00 10 mL
  Filled 2018-08-14: qty 10

## 2018-08-14 MED ORDER — HEPARIN SOD (PORK) LOCK FLUSH 100 UNIT/ML IV SOLN
500.0000 [IU] | Freq: Once | INTRAVENOUS | Status: AC | PRN
  Administered 2018-08-14: 500 [IU]
  Filled 2018-08-14: qty 5

## 2018-08-14 MED ORDER — ACETAMINOPHEN 325 MG PO TABS
ORAL_TABLET | ORAL | Status: AC
  Filled 2018-08-14: qty 2

## 2018-08-14 MED ORDER — ACETAMINOPHEN 325 MG PO TABS
650.0000 mg | ORAL_TABLET | Freq: Once | ORAL | Status: AC
  Administered 2018-08-14: 650 mg via ORAL

## 2018-08-14 MED ORDER — SODIUM CHLORIDE 0.9 % IV SOLN
5.3000 mg/kg | Freq: Once | INTRAVENOUS | Status: AC
  Administered 2018-08-14: 600 mg via INTRAVENOUS
  Filled 2018-08-14: qty 16

## 2018-08-14 MED ORDER — SODIUM CHLORIDE 0.9 % IV SOLN
Freq: Once | INTRAVENOUS | Status: AC
  Administered 2018-08-14: 11:00:00 via INTRAVENOUS
  Filled 2018-08-14: qty 250

## 2018-08-14 MED ORDER — SODIUM CHLORIDE 0.9% IV SOLUTION
250.0000 mL | Freq: Once | INTRAVENOUS | Status: AC
  Administered 2018-08-14: 250 mL via INTRAVENOUS
  Filled 2018-08-14: qty 250

## 2018-08-14 NOTE — Telephone Encounter (Signed)
Scheduled appt per 5/1 los.  Left a VM of scheduled appts.

## 2018-08-14 NOTE — Progress Notes (Signed)
CRITICAL VALUE STICKER  CRITICAL VALUE:  Hemoglobin 4.1  RECEIVER (on-site recipient of call):  Valda Favia RN DATE & TIME NOTIFIED: 08/14/2018 at 1050  MESSENGER (representative from lab):  Rosann Auerbach  MD NOTIFIED: Dr. Burr Medico  TIME OF NOTIFICATION:1055  RESPONSE: Orders received for blood

## 2018-08-14 NOTE — Patient Instructions (Signed)

## 2018-08-14 NOTE — Patient Instructions (Signed)
[facility] Discharge Instructions for Patients Receiving Chemotherapy  Today you received the following chemotherapy agents avastin   To help prevent nausea and vomiting after your treatment, we encourage you to take your nausea medication as directed  If you develop nausea and vomiting that is not controlled by your nausea medication, call the clinic.   BELOW ARE SYMPTOMS THAT SHOULD BE REPORTED IMMEDIATELY:  *FEVER GREATER THAN 100.5 F  *CHILLS WITH OR WITHOUT FEVER  NAUSEA AND VOMITING THAT IS NOT CONTROLLED WITH YOUR NAUSEA MEDICATION  *UNUSUAL SHORTNESS OF BREATH  *UNUSUAL BRUISING OR BLEEDING  TENDERNESS IN MOUTH AND THROAT WITH OR WITHOUT PRESENCE OF ULCERS  *URINARY PROBLEMS  *BOWEL PROBLEMS  UNUSUAL RASH Items with * indicate a potential emergency and should be followed up as soon as possible.  Feel free to call the clinic you have any questions or concerns. The clinic phone number is [phone].  Sodium Ferric Gluconate Complex injection What is this medicine? SODIUM FERRIC GLUCONATE COMPLEX (SOE dee um FER ik GLOO koe nate KOM pleks) is an iron replacement. It is used with epoetin therapy to treat low iron levels in patients who are receiving hemodialysis. This medicine may be used for other purposes; ask your health care provider or pharmacist if you have questions. COMMON BRAND NAME(S): Ferrlecit, Nulecit What should I tell my health care provider before I take this medicine? They need to know if you have any of the following conditions: -anemia that is not from iron deficiency -high levels of iron in the body -an unusual or allergic reaction to iron, benzyl alcohol, other medicines, foods, dyes, or preservatives -pregnant or are trying to become pregnant -breast-feeding How should I use this medicine? This medicine is for infusion into a vein. It is given by a health care professional in a hospital or clinic setting. Talk to your  pediatrician regarding the use of this medicine in children. While this drug may be prescribed for children as young as 22 years old for selected conditions, precautions do apply. Overdosage: If you think you have taken too much of this medicine contact a poison control center or emergency room at once. NOTE: This medicine is only for you. Do not share this medicine with others. What if I miss a dose? It is important not to miss your dose. Call your doctor or health care professional if you are unable to keep an appointment. What may interact with this medicine? Do not take this medicine with any of the following medications: -deferoxamine -dimercaprol -other iron products This medicine may also interact with the following medications: -chloramphenicol -deferasirox -medicine for blood pressure like enalapril This list may not describe all possible interactions. Give your health care provider a list of all the medicines, herbs, non-prescription drugs, or dietary supplements you use. Also tell them if you smoke, drink alcohol, or use illegal drugs. Some items may interact with your medicine. What should I watch for while using this medicine? Your condition will be monitored carefully while you are receiving this medicine. Visit your doctor for check-ups as directed. What side effects may I notice from receiving this medicine? Side effects that you should report to your doctor or health care professional as soon as possible: -allergic reactions like skin rash, itching or hives, swelling of the face, lips, or tongue -breathing problems -changes in hearing -changes in vision -chills, flushing, or sweating -fast, irregular heartbeat -feeling faint or lightheaded, falls -fever, flu-like symptoms -high or low blood pressure -pain,  tingling, numbness in the hands or feet -severe pain in the chest, back, flanks, or groin -swelling of the ankles, feet, hands -trouble passing urine or change in  the amount of urine -unusually weak or tired Side effects that usually do not require medical attention (report to your doctor or health care professional if they continue or are bothersome): -cramps -dark colored stools -diarrhea -headache -nausea, vomiting -stomach upset This list may not describe all possible side effects. Call your doctor for medical advice about side effects. You may report side effects to FDA at 1-800-FDA-1088. Where should I keep my medicine? This drug is given in a hospital or clinic and will not be stored at home. NOTE: This sheet is a summary. It may not cover all possible information. If you have questions about this medicine, talk to your doctor, pharmacist, or health care provider.  2019 Elsevier/Gold Standard (2007-12-02 15:58:57)  Blood Transfusion, Adult, Care After This sheet gives you information about how to care for yourself after your procedure. Your doctor may also give you more specific instructions. If you have problems or questions, contact your doctor. Follow these instructions at home:   Take over-the-counter and prescription medicines only as told by your doctor.  Go back to your normal activities as told by your doctor.  Follow instructions from your doctor about how to take care of the area where an IV tube was put into your vein (insertion site). Make sure you: ? Wash your hands with soap and water before you change your bandage (dressing). If there is no soap and water, use hand sanitizer. ? Change your bandage as told by your doctor.  Check your IV insertion site every day for signs of infection. Check for: ? More redness, swelling, or pain. ? More fluid or blood. ? Warmth. ? Pus or a bad smell. Contact a doctor if:  You have more redness, swelling, or pain around the IV insertion site.  You have more fluid or blood coming from the IV insertion site.  Your IV insertion site feels warm to the touch.  You have pus or a bad  smell coming from the IV insertion site.  Your pee (urine) turns pink, red, or brown.  You feel weak after doing your normal activities. Get help right away if:  You have signs of a serious allergic or body defense (immune) system reaction, including: ? Itchiness. ? Hives. ? Trouble breathing. ? Anxiety. ? Pain in your chest or lower back. ? Fever, flushing, and chills. ? Fast pulse. ? Rash. ? Watery poop (diarrhea). ? Throwing up (vomiting). ? Dark pee. ? Serious headache. ? Dizziness. ? Stiff neck. ? Yellow color in your face or the white parts of your eyes (jaundice). Summary  After a blood transfusion, return to your normal activities as told by your doctor.  Every day, check for signs of infection where the IV tube was put into your vein.  Some signs of infection are warm skin, more redness and pain, more fluid or blood, and pus or a bad smell where the needle went in.  Contact your doctor if you feel weak or have any unusual symptoms. This information is not intended to replace advice given to you by your health care provider. Make sure you discuss any questions you have with your health care provider. Document Released: 04/22/2014 Document Revised: 11/24/2015 Document Reviewed: 11/24/2015 Elsevier Interactive Patient Education  [employer].

## 2018-08-15 ENCOUNTER — Other Ambulatory Visit: Payer: Self-pay

## 2018-08-15 ENCOUNTER — Inpatient Hospital Stay: Payer: Medicaid Other

## 2018-08-15 VITALS — BP 131/64 | HR 65 | Temp 98.6°F | Resp 18

## 2018-08-15 DIAGNOSIS — I780 Hereditary hemorrhagic telangiectasia: Secondary | ICD-10-CM

## 2018-08-15 DIAGNOSIS — D500 Iron deficiency anemia secondary to blood loss (chronic): Secondary | ICD-10-CM

## 2018-08-15 DIAGNOSIS — Z5112 Encounter for antineoplastic immunotherapy: Secondary | ICD-10-CM | POA: Diagnosis not present

## 2018-08-15 MED ORDER — DIPHENHYDRAMINE HCL 25 MG PO CAPS
25.0000 mg | ORAL_CAPSULE | Freq: Once | ORAL | Status: AC
  Administered 2018-08-15: 09:00:00 25 mg via ORAL

## 2018-08-15 MED ORDER — HEPARIN SOD (PORK) LOCK FLUSH 100 UNIT/ML IV SOLN
500.0000 [IU] | Freq: Every day | INTRAVENOUS | Status: AC | PRN
  Administered 2018-08-15: 12:00:00 500 [IU]
  Filled 2018-08-15: qty 5

## 2018-08-15 MED ORDER — ACETAMINOPHEN 325 MG PO TABS
ORAL_TABLET | ORAL | Status: AC
  Filled 2018-08-15: qty 2

## 2018-08-15 MED ORDER — HEPARIN SOD (PORK) LOCK FLUSH 100 UNIT/ML IV SOLN
250.0000 [IU] | INTRAVENOUS | Status: DC | PRN
  Filled 2018-08-15: qty 5

## 2018-08-15 MED ORDER — DIPHENHYDRAMINE HCL 25 MG PO CAPS
ORAL_CAPSULE | ORAL | Status: AC
  Filled 2018-08-15: qty 1

## 2018-08-15 MED ORDER — SODIUM CHLORIDE 0.9% FLUSH
3.0000 mL | INTRAVENOUS | Status: DC | PRN
  Filled 2018-08-15: qty 10

## 2018-08-15 MED ORDER — ACETAMINOPHEN 325 MG PO TABS
650.0000 mg | ORAL_TABLET | Freq: Once | ORAL | Status: AC
  Administered 2018-08-15: 650 mg via ORAL

## 2018-08-15 MED ORDER — SODIUM CHLORIDE 0.9 % IV SOLN
Freq: Once | INTRAVENOUS | Status: AC
  Administered 2018-08-15: 10:00:00 via INTRAVENOUS
  Filled 2018-08-15: qty 250

## 2018-08-15 MED ORDER — SODIUM CHLORIDE 0.9% FLUSH
10.0000 mL | Freq: Once | INTRAVENOUS | Status: AC | PRN
  Administered 2018-08-15: 10 mL
  Filled 2018-08-15: qty 10

## 2018-08-15 NOTE — Patient Instructions (Signed)
Blood Transfusion, Adult  A blood transfusion is a procedure in which you receive donated blood, including plasma, platelets, and red blood cells, through an IV tube. You may need a blood transfusion because of illness, surgery, or injury. The blood may come from a donor. You may also be able to donate blood for yourself (autologous blood donation) before a surgery if you know that you might require a blood transfusion. The blood given in a transfusion is made up of different types of cells. You may receive:  Red blood cells. These carry oxygen to the cells in the body.  White blood cells. These help you fight infections.  Platelets. These help your blood to clot.  Plasma. This is the liquid part of your blood and it helps with fluid imbalances. If you have hemophilia or another clotting disorder, you may also receive other types of blood products. Tell a health care provider about:  Any allergies you have.  All medicines you are taking, including vitamins, herbs, eye drops, creams, and over-the-counter medicines.  Any problems you or family members have had with anesthetic medicines.  Any blood disorders you have.  Any surgeries you have had.  Any medical conditions you have, including any recent fever or cold symptoms.  Whether you are pregnant or may be pregnant.  Any previous reactions you have had during a blood transfusion. What are the risks? Generally, this is a safe procedure. However, problems may occur, including:  Having an allergic reaction to something in the donated blood. Hives and itching may be symptoms of this type of reaction.  Fever. This may be a reaction to the white blood cells in the transfused blood. Nausea or chest pain may accompany a fever.  Iron overload. This can happen from having many transfusions.  Transfusion-related acute lung injury (TRALI). This is a rare reaction that causes lung damage. The cause is not known.TRALI can occur within hours  of a transfusion or several days later.  Sudden (acute) or delayed hemolytic reactions. This happens if your blood does not match the cells in your transfusion. Your body's defense system (immune system) may try to attack the new cells. This complication is rare. The symptoms include fever, chills, nausea, and low back pain or chest pain.  Infection or disease transmission. This is rare. What happens before the procedure?  You will have a blood test to determine your blood type. This is necessary to know what kind of blood your body will accept and to match it to the donor blood.  If you are going to have a planned surgery, you may be able to do an autologous blood donation. This may be done in case you need to have a transfusion.  If you have had an allergic reaction to a transfusion in the past, you may be given medicine to help prevent a reaction. This medicine may be given to you by mouth or through an IV tube.  You will have your temperature, blood pressure, and pulse monitored before the transfusion.  Follow instructions from your health care provider about eating and drinking restrictions.  Ask your health care provider about: ? Changing or stopping your regular medicines. This is especially important if you are taking diabetes medicines or blood thinners. ? Taking medicines such as aspirin and ibuprofen. These medicines can thin your blood. Do not take these medicines before your procedure if your health care provider instructs you not to. What happens during the procedure?  An IV tube will be   inserted into one of your veins.  The bag of donated blood will be attached to your IV tube. The blood will then enter through your vein.  Your temperature, blood pressure, and pulse will be monitored regularly during the transfusion. This monitoring is done to detect early signs of a transfusion reaction.  If you have any signs or symptoms of a reaction, your transfusion will be stopped and  you may be given medicine.  When the transfusion is complete, your IV tube will be removed.  Pressure may be applied to the IV site for a few minutes.  A bandage (dressing) will be applied. The procedure may vary among health care providers and hospitals. What happens after the procedure?  Your temperature, blood pressure, heart rate, breathing rate, and blood oxygen level will be monitored often.  Your blood may be tested to see how you are responding to the transfusion.  You may be warmed with fluids or blankets to maintain a normal body temperature. Summary  A blood transfusion is a procedure in which you receive donated blood, including plasma, platelets, and red blood cells, through an IV tube.  Your temperature, blood pressure, and pulse will be monitored before, during, and after the transfusion.  Your blood may be tested after the transfusion to see how your body has responded. This information is not intended to replace advice given to you by your health care provider. Make sure you discuss any questions you have with your health care provider. Document Released: 03/29/2000 Document Revised: 12/28/2015 Document Reviewed: 12/28/2015 Elsevier Interactive Patient Education  2019 [place] (COVID-19) Are you at risk?  Are you at risk for the Coronavirus (COVID-19)?  To be considered HIGH RISK for Coronavirus (COVID-19), you have to meet the following criteria:  . Traveled to [place], [place], [place], [place] or [place]; or in the [place] to [place], [place], [place], or [place]; and have fever, cough, and shortness of breath within the last 2 weeks of travel OR . Been in close contact with a person diagnosed with COVID-19 within the last 2 weeks and have fever, cough, and shortness of breath . IF YOU DO NOT MEET THESE CRITERIA, YOU ARE CONSIDERED LOW RISK FOR COVID-19.  What to do if you are HIGH RISK for COVID-19?  [name] If you are having a  medical emergency, call 911. . Seek medical care right away. Before you go to a doctor's office, urgent care or emergency department, call ahead and tell them about your recent travel, contact with someone diagnosed with COVID-19, and your symptoms. You should receive instructions from your physician's office regarding next steps of care.  . When you arrive at healthcare provider, tell the healthcare staff immediately you have returned from visiting [place], [place], [place], [place] or [place]; or traveled in the [place] to [place], [place], [place], or [place]; in the last two weeks or you have been in close contact with a person diagnosed with COVID-19 in the last 2 weeks.   . Tell the health care staff about your symptoms: fever, cough and shortness of breath. . After you have been seen by a medical provider, you will be either: o Tested for (COVID-19) and discharged home on quarantine except to seek medical care if symptoms worsen, and asked to  - Stay home and avoid contact with others until you get your results (4-5 days)  - Avoid travel on public transportation if possible (such as bus,  train, or airplane) or o Sent to the Emergency Department by EMS for evaluation, COVID-19 testing, and possible admission depending on your condition and test results.  What to do if you are LOW RISK for COVID-19?  Reduce your risk of any infection by using the same precautions used for avoiding the common cold or flu:  [name] Wash your hands often with soap and warm water for at least 20 seconds.  If soap and water are not readily available, use an alcohol-based hand sanitizer with at least 60% alcohol.  . If coughing or sneezing, cover your mouth and nose by coughing or sneezing into the elbow areas of your shirt or coat, into a tissue or into your sleeve (not your hands). . Avoid shaking hands with others and consider head nods or verbal greetings only. . Avoid touching your eyes, nose, or mouth with  unwashed hands.  . Avoid close contact with people who are sick. . Avoid places or events with large numbers of people in one location, like concerts or sporting events. . Carefully consider travel plans you have or are making. . If you are planning any travel outside or inside the [place], visit the CDC's Travelers' Health webpage for the latest health notices. . If you have some symptoms but not all symptoms, continue to monitor at home and seek medical attention if your symptoms worsen. . If you are having a medical emergency, call 911.   [place] / e-Visit: [email or web address]         [facility]: [facility]: [phone]                   [facility]: [phone]

## 2018-08-17 ENCOUNTER — Telehealth: Payer: Self-pay | Admitting: Hematology

## 2018-08-17 ENCOUNTER — Other Ambulatory Visit: Payer: Medicaid Other

## 2018-08-17 DIAGNOSIS — M1990 Unspecified osteoarthritis, unspecified site: Secondary | ICD-10-CM | POA: Diagnosis not present

## 2018-08-17 LAB — TYPE AND SCREEN
ABO/RH(D): A NEG
Antibody Screen: NEGATIVE
Unit division: 0
Unit division: 0

## 2018-08-17 LAB — BPAM RBC
Blood Product Expiration Date: 202005162359
Blood Product Expiration Date: 202005182359
ISSUE DATE / TIME: 202005011322
ISSUE DATE / TIME: 202005020951
Unit Type and Rh: 600
Unit Type and Rh: 600

## 2018-08-17 NOTE — Telephone Encounter (Signed)
Scheduled IV ferric gluconate per sch msg. Called patient, no answer. Left msg about changes made to 5/8 appt.

## 2018-08-18 DIAGNOSIS — M1990 Unspecified osteoarthritis, unspecified site: Secondary | ICD-10-CM | POA: Diagnosis not present

## 2018-08-19 ENCOUNTER — Telehealth: Payer: Self-pay

## 2018-08-19 DIAGNOSIS — M1990 Unspecified osteoarthritis, unspecified site: Secondary | ICD-10-CM | POA: Diagnosis not present

## 2018-08-19 NOTE — Telephone Encounter (Signed)
Attempted to reach patient's son Peggy House to remind him of her upcoming appointments on Friday 5/8 at 8:45, no answer not able to leave a voice message.

## 2018-08-19 NOTE — Telephone Encounter (Signed)
Spoke with patient regarding missed lab appointment on 5/5, patient states she forgot, feeling much better after receiving blood. Reminded her of her appointments on Friday 5/8 and I informed her I will call her son to remind him as well. She verbalized an understanding.

## 2018-08-19 NOTE — Telephone Encounter (Signed)
Error

## 2018-08-20 DIAGNOSIS — M1990 Unspecified osteoarthritis, unspecified site: Secondary | ICD-10-CM | POA: Diagnosis not present

## 2018-08-21 ENCOUNTER — Inpatient Hospital Stay: Payer: Medicaid Other

## 2018-08-21 ENCOUNTER — Other Ambulatory Visit: Payer: Self-pay

## 2018-08-21 VITALS — BP 153/70 | HR 62 | Temp 98.6°F | Resp 17

## 2018-08-21 DIAGNOSIS — D500 Iron deficiency anemia secondary to blood loss (chronic): Secondary | ICD-10-CM

## 2018-08-21 DIAGNOSIS — M1990 Unspecified osteoarthritis, unspecified site: Secondary | ICD-10-CM | POA: Diagnosis not present

## 2018-08-21 DIAGNOSIS — Z5112 Encounter for antineoplastic immunotherapy: Secondary | ICD-10-CM | POA: Diagnosis not present

## 2018-08-21 LAB — CBC WITH DIFFERENTIAL (CANCER CENTER ONLY)
Abs Immature Granulocytes: 0.05 10*3/uL (ref 0.00–0.07)
Basophils Absolute: 0 10*3/uL (ref 0.0–0.1)
Basophils Relative: 0 %
Eosinophils Absolute: 0.1 10*3/uL (ref 0.0–0.5)
Eosinophils Relative: 1 %
HCT: 23.5 % — ABNORMAL LOW (ref 36.0–46.0)
Hemoglobin: 6.7 g/dL — CL (ref 12.0–15.0)
Immature Granulocytes: 1 %
Lymphocytes Relative: 12 %
Lymphs Abs: 1.3 10*3/uL (ref 0.7–4.0)
MCH: 23.7 pg — ABNORMAL LOW (ref 26.0–34.0)
MCHC: 28.5 g/dL — ABNORMAL LOW (ref 30.0–36.0)
MCV: 83 fL (ref 80.0–100.0)
Monocytes Absolute: 0.5 10*3/uL (ref 0.1–1.0)
Monocytes Relative: 4 %
Neutro Abs: 8.6 10*3/uL — ABNORMAL HIGH (ref 1.7–7.7)
Neutrophils Relative %: 82 %
Platelet Count: 253 10*3/uL (ref 150–400)
RBC: 2.83 MIL/uL — ABNORMAL LOW (ref 3.87–5.11)
RDW: 22.8 % — ABNORMAL HIGH (ref 11.5–15.5)
WBC Count: 10.5 10*3/uL (ref 4.0–10.5)
nRBC: 0 % (ref 0.0–0.2)

## 2018-08-21 LAB — RETICULOCYTES
Immature Retic Fract: 13.3 % (ref 2.3–15.9)
RBC.: 2.9 MIL/uL — ABNORMAL LOW (ref 3.87–5.11)
Retic Count, Absolute: 82 10*3/uL (ref 19.0–186.0)
Retic Ct Pct: 2.8 % (ref 0.4–3.1)

## 2018-08-21 LAB — PREPARE RBC (CROSSMATCH)

## 2018-08-21 MED ORDER — SODIUM CHLORIDE 0.9% IV SOLUTION
250.0000 mL | Freq: Once | INTRAVENOUS | Status: AC
  Administered 2018-08-21: 250 mL via INTRAVENOUS
  Filled 2018-08-21: qty 250

## 2018-08-21 MED ORDER — HEPARIN SOD (PORK) LOCK FLUSH 100 UNIT/ML IV SOLN
500.0000 [IU] | Freq: Once | INTRAVENOUS | Status: DC | PRN
  Filled 2018-08-21: qty 5

## 2018-08-21 MED ORDER — SODIUM CHLORIDE 0.9 % IV SOLN
125.0000 mg | Freq: Once | INTRAVENOUS | Status: AC
  Administered 2018-08-21: 125 mg via INTRAVENOUS
  Filled 2018-08-21: qty 10

## 2018-08-21 MED ORDER — DIPHENHYDRAMINE HCL 25 MG PO CAPS
ORAL_CAPSULE | ORAL | Status: AC
  Filled 2018-08-21: qty 1

## 2018-08-21 MED ORDER — SODIUM CHLORIDE 0.9% FLUSH
10.0000 mL | Freq: Once | INTRAVENOUS | Status: AC | PRN
  Administered 2018-08-21: 10 mL
  Filled 2018-08-21: qty 10

## 2018-08-21 MED ORDER — ACETAMINOPHEN 325 MG PO TABS
650.0000 mg | ORAL_TABLET | Freq: Once | ORAL | Status: AC
  Administered 2018-08-21: 650 mg via ORAL

## 2018-08-21 MED ORDER — SODIUM CHLORIDE 0.9 % IV SOLN
Freq: Once | INTRAVENOUS | Status: AC
  Administered 2018-08-21: 10:00:00 via INTRAVENOUS
  Filled 2018-08-21: qty 250

## 2018-08-21 MED ORDER — ACETAMINOPHEN 325 MG PO TABS
ORAL_TABLET | ORAL | Status: AC
  Filled 2018-08-21: qty 2

## 2018-08-21 MED ORDER — DIPHENHYDRAMINE HCL 25 MG PO CAPS
25.0000 mg | ORAL_CAPSULE | Freq: Once | ORAL | Status: AC
  Administered 2018-08-21: 25 mg via ORAL

## 2018-08-21 MED ORDER — HEPARIN SOD (PORK) LOCK FLUSH 100 UNIT/ML IV SOLN
500.0000 [IU] | Freq: Every day | INTRAVENOUS | Status: AC | PRN
  Administered 2018-08-21: 500 [IU]
  Filled 2018-08-21: qty 5

## 2018-08-21 NOTE — Patient Instructions (Addendum)
Ferric carboxymaltose injection What is this medicine? FERRIC CARBOXYMALTOSE (ferr-ik car-box-ee-mol-toes) is an iron complex. Iron is used to make healthy red blood cells, which carry oxygen and nutrients throughout the body. This medicine is used to treat anemia in people with chronic kidney disease or people who cannot take iron by mouth. This medicine may be used for other purposes; ask your health care provider or pharmacist if you have questions. COMMON BRAND NAME(S): Injectafer What should I tell my health care provider before I take this medicine? They need to know if you have any of these conditions: -high levels of iron in the blood -liver disease -an unusual or allergic reaction to iron, other medicines, foods, dyes, or preservatives -pregnant or trying to get pregnant -breast-feeding How should I use this medicine? This medicine is for infusion into a vein. It is given by a health care professional in a hospital or clinic setting. Talk to your pediatrician regarding the use of this medicine in children. Special care may be needed. Overdosage: If you think you have taken too much of this medicine contact a poison control center or emergency room at once. NOTE: This medicine is only for you. Do not share this medicine with others. What if I miss a dose? It is important not to miss your dose. Call your doctor or health care professional if you are unable to keep an appointment. What may interact with this medicine? Do not take this medicine with any of the following medications: -deferoxamine -dimercaprol -other iron products This list may not describe all possible interactions. Give your health care provider a list of all the medicines, herbs, non-prescription drugs, or dietary supplements you use. Also tell them if you smoke, drink alcohol, or use illegal drugs. Some items may interact with your medicine. What should I watch for while using this medicine? Visit your doctor or  health care professional regularly. Tell your doctor if your symptoms do not start to get better or if they get worse. You may need blood work done while you are taking this medicine. You may need to follow a special diet. Talk to your doctor. Foods that contain iron include: whole grains/cereals, dried fruits, beans, or peas, leafy green vegetables, and organ meats (liver, kidney). What side effects may I notice from receiving this medicine? Side effects that you should report to your doctor or health care professional as soon as possible: -allergic reactions like skin rash, itching or hives, swelling of the face, lips, or tongue -dizziness -facial flushing Side effects that usually do not require medical attention (report to your doctor or health care professional if they continue or are bothersome): -changes in taste -constipation -headache -nausea, vomiting -pain, redness, or irritation at site where injected This list may not describe all possible side effects. Call your doctor for medical advice about side effects. You may report side effects to FDA at 1-800-FDA-1088. Where should I keep my medicine? This drug is given in a hospital or clinic and will not be stored at home. NOTE: This sheet is a summary. It may not cover all possible information. If you have questions about this medicine, talk to your doctor, pharmacist, or health care provider.  2019 Elsevier/Gold Standard (2016-05-16 09:40:29)   Blood Transfusion, Adult, Care After This sheet gives you information about how to care for yourself after your procedure. Your doctor may also give you more specific instructions. If you have problems or questions, contact your doctor. Follow these instructions at home:   Take  over-the-counter and prescription medicines only as told by your doctor.  Go back to your normal activities as told by your doctor.  Follow instructions from your doctor about how to take care of the area where an  IV tube was put into your vein (insertion site). Make sure you: ? Wash your hands with soap and water before you change your bandage (dressing). If there is no soap and water, use hand sanitizer. ? Change your bandage as told by your doctor.  Check your IV insertion site every day for signs of infection. Check for: ? More redness, swelling, or pain. ? More fluid or blood. ? Warmth. ? Pus or a bad smell. Contact a doctor if:  You have more redness, swelling, or pain around the IV insertion site.  You have more fluid or blood coming from the IV insertion site.  Your IV insertion site feels warm to the touch.  You have pus or a bad smell coming from the IV insertion site.  Your pee (urine) turns pink, red, or brown.  You feel weak after doing your normal activities. Get help right away if:  You have signs of a serious allergic or body defense (immune) system reaction, including: ? Itchiness. ? Hives. ? Trouble breathing. ? Anxiety. ? Pain in your chest or lower back. ? Fever, flushing, and chills. ? Fast pulse. ? Rash. ? Watery poop (diarrhea). ? Throwing up (vomiting). ? Dark pee. ? Serious headache. ? Dizziness. ? Stiff neck. ? Yellow color in your face or the white parts of your eyes (jaundice). Summary  After a blood transfusion, return to your normal activities as told by your doctor.  Every day, check for signs of infection where the IV tube was put into your vein.  Some signs of infection are warm skin, more redness and pain, more fluid or blood, and pus or a bad smell where the needle went in.  Contact your doctor if you feel weak or have any unusual symptoms. This information is not intended to replace advice given to you by your health care provider. Make sure you discuss any questions you have with your health care provider. Document Released: 04/22/2014 Document Revised: 11/24/2015 Document Reviewed: 11/24/2015 Elsevier Interactive Patient Education  [employer].

## 2018-08-22 ENCOUNTER — Other Ambulatory Visit: Payer: Self-pay | Admitting: Hematology

## 2018-08-22 DIAGNOSIS — I10 Essential (primary) hypertension: Secondary | ICD-10-CM

## 2018-08-22 LAB — TYPE AND SCREEN
ABO/RH(D): A NEG
Antibody Screen: NEGATIVE
Unit division: 0

## 2018-08-22 LAB — BPAM RBC
Blood Product Expiration Date: 202006032359
ISSUE DATE / TIME: 202005081201
Unit Type and Rh: 600

## 2018-08-24 ENCOUNTER — Other Ambulatory Visit: Payer: Self-pay | Admitting: Nurse Practitioner

## 2018-08-24 DIAGNOSIS — M1990 Unspecified osteoarthritis, unspecified site: Secondary | ICD-10-CM | POA: Diagnosis not present

## 2018-08-24 DIAGNOSIS — I10 Essential (primary) hypertension: Secondary | ICD-10-CM

## 2018-08-24 MED ORDER — ALPRAZOLAM 0.5 MG PO TABS: 0.5000 mg | ORAL_TABLET | Freq: Two times a day (BID) | ORAL | 1 refills | Status: DC | PRN

## 2018-08-24 MED ORDER — AMLODIPINE BESYLATE 10 MG PO TABS: 10.0000 mg | ORAL_TABLET | Freq: Every day | ORAL | 0 refills | Status: DC

## 2018-08-24 NOTE — Telephone Encounter (Signed)
Dr. Burr Medico, Ok to refill thept's Amlodopine and alprazalam?  Thanks,  Drucie Ip, RN

## 2018-08-25 ENCOUNTER — Other Ambulatory Visit: Payer: Self-pay | Admitting: Hematology

## 2018-08-25 DIAGNOSIS — D500 Iron deficiency anemia secondary to blood loss (chronic): Secondary | ICD-10-CM

## 2018-08-25 DIAGNOSIS — M1990 Unspecified osteoarthritis, unspecified site: Secondary | ICD-10-CM | POA: Diagnosis not present

## 2018-08-26 DIAGNOSIS — M1990 Unspecified osteoarthritis, unspecified site: Secondary | ICD-10-CM | POA: Diagnosis not present

## 2018-08-27 DIAGNOSIS — M1990 Unspecified osteoarthritis, unspecified site: Secondary | ICD-10-CM | POA: Diagnosis not present

## 2018-08-27 NOTE — Progress Notes (Signed)
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 She presents to the clinic today by herself. She notes she feels better this week compared to last week. She notes her BMs are normal with normal color. She goes twice a day. She denies issues with Avastin or IV iron. Except for nose bleeds. She notes she does have HTN which was uncontrolled in the past. She does take her anti-HTN meds. She notes since last week she has been having more trouble sleeping. She has taken Melatonin 5mg  before. She notes she is taking Amicar and Tamoxifen.  She notes having recent lower back pain. She had has this before. She sees orthopedist.    REVIEW OF SYSTEMS:   Constitutional: Denies fevers, chills or abnormal weight loss Eyes: Denies blurriness of vision Ears, nose, mouth, throat, and face: Denies mucositis or sore throat Respiratory: Denies cough, dyspnea or wheezes Cardiovascular: Denies palpitation, chest discomfort or lower extremity swelling Gastrointestinal:  Denies  nausea, heartburn or change in bowel habits Skin: Denies abnormal skin rashes Lymphatics: Denies new lymphadenopathy or easy bruising Neurological:Denies numbness, tingling or new weaknesses Behavioral/Psych: Mood is stable, no new changes  All other systems were reviewed with the patient and are negative.  MEDICAL HISTORY:  Past Medical History:  Diagnosis Date  . Anxiety   . Arthritis    knnes,back  . GERD (gastroesophageal reflux disease)   . 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 History of swelling of feet   . Hyperlipidemia   . Hypertension   . Major depressive disorder, recurrent episode ([place]) 06/05/2015  . Obesity   . Snores     SURGICAL HISTORY: Past Surgical History:  Procedure Laterality Date  . ABDOMINAL HYSTERECTOMY    . CARPAL TUNNEL RELEASE  05/13/2011   Procedure: CARPAL TUNNEL RELEASE;  Surgeon: Nita Sells, MD;  Location: [place];  Service: Orthopedics;  Laterality: Left;  . COLONOSCOPY WITH PROPOFOL N/A 04/28/2014   Procedure: COLONOSCOPY WITH PROPOFOL;  Surgeon: Cleotis Nipper, MD;  Location: [facility] ENDOSCOPY;  Service: Endoscopy;  Laterality: N/A;  . DG TOES*L*  2/10   rt  . DILATION AND CURETTAGE OF UTERUS    . ENTEROSCOPY N/A 10/17/2017   Procedure: ENTEROSCOPY;  Surgeon: Otis Brace, MD;  Location: [place] ENDOSCOPY;  Service: Gastroenterology;  Laterality: N/A;  . ESOPHAGOGASTRODUODENOSCOPY N/A 04/10/2014   Procedure: ESOPHAGOGASTRODUODENOSCOPY (EGD);  Surgeon: [name], MD;  Location: [facility] ENDOSCOPY;  Service: Endoscopy;  Laterality: N/A;  . ESOPHAGOGASTRODUODENOSCOPY N/A 05/10/2017   Procedure: ESOPHAGOGASTRODUODENOSCOPY (EGD);  Surgeon: Ronald Lobo, MD;  Location: [facility] ENDOSCOPY;  Service: Endoscopy;  Laterality: N/A;  . ESOPHAGOGASTRODUODENOSCOPY  N/A 09/22/2017   Procedure: ESOPHAGOGASTRODUODENOSCOPY (EGD);  Surgeon: Clarene Essex, MD;  Location: [place];  Service: Endoscopy;  Laterality: N/A;  bedside   . ESOPHAGOGASTRODUODENOSCOPY (EGD) WITH PROPOFOL N/A 04/27/2014   Procedure: ESOPHAGOGASTRODUODENOSCOPY (EGD) WITH PROPOFOL;  Surgeon: Cleotis Nipper, MD;  Location: [place];  Service: Endoscopy;  Laterality: N/A;  possible apc  . ESOPHAGOGASTRODUODENOSCOPY (EGD) WITH PROPOFOL N/A 09/30/2017   Procedure: ESOPHAGOGASTRODUODENOSCOPY (EGD) WITH PROPOFOL;  Surgeon: [name], MD;  Location: [place];  Service: Gastroenterology;  Laterality: N/A;  . ESOPHAGOGASTRODUODENOSCOPY (EGD) WITH PROPOFOL N/A 10/01/2017   Procedure: ESOPHAGOGASTRODUODENOSCOPY (EGD) WITH PROPOFOL;  Surgeon: [name], MD;  Location: [place];  Service: Gastroenterology;  Laterality: N/A;  . ESOPHAGOGASTRODUODENOSCOPY (EGD) WITH PROPOFOL N/A 10/08/2017   Procedure: ESOPHAGOGASTRODUODENOSCOPY (EGD) WITH PROPOFOL;  Surgeon: Otis Brace, MD;  Location: [facility] ENDOSCOPY;  Service: Gastroenterology;  Laterality: N/A;  . ESOPHAGOGASTRODUODENOSCOPY (EGD) WITH PROPOFOL N/A 10/17/2017   Procedure: ESOPHAGOGASTRODUODENOSCOPY (EGD) WITH PROPOFOL;  Surgeon: Otis Brace, MD;  Location: [facility] ENDOSCOPY;  Service: Gastroenterology;  Laterality: N/A;  . ESOPHAGOGASTRODUODENOSCOPY (EGD) WITH PROPOFOL N/A 10/19/2017   Procedure: ESOPHAGOGASTRODUODENOSCOPY (EGD) WITH PROPOFOL;  Surgeon: Otis Brace, MD;  Location: [facility] ENDOSCOPY;  Service: Gastroenterology;  Laterality: N/A;  . GIVENS CAPSULE STUDY N/A 10/02/2017   Procedure: GIVENS CAPSULE STUDY;  Surgeon: [name], MD;  Location: [place];  Service: Gastroenterology;  Laterality: N/A;  . GIVENS CAPSULE STUDY N/A 10/08/2017   Procedure: GIVENS CAPSULE STUDY;  Surgeon: Otis Brace, MD;  Location: [place];  Service: Gastroenterology;  Laterality: N/A;  endoscopic placement of capsule  . HOT HEMOSTASIS N/A 04/27/2014   Procedure: HOT HEMOSTASIS (ARGON PLASMA COAGULATION/BICAP);  Surgeon: Cleotis Nipper, MD;  Location: [facility] ENDOSCOPY;  Service: Endoscopy;  Laterality:  N/A;  . HOT HEMOSTASIS N/A 09/30/2017   Procedure: HOT HEMOSTASIS (ARGON PLASMA COAGULATION/BICAP);  Surgeon: [name], MD;  Location: [place];  Service: Gastroenterology;  Laterality: N/A;  . HOT HEMOSTASIS N/A 10/01/2017   Procedure: HOT HEMOSTASIS (ARGON PLASMA COAGULATION/BICAP);  Surgeon: [name], MD;  Location: [place];  Service: Gastroenterology;  Laterality: N/A;  . HOT HEMOSTASIS N/A 10/17/2017   Procedure: HOT HEMOSTASIS (ARGON PLASMA COAGULATION/BICAP);  Surgeon: Otis Brace, MD;  Location: [facility] ENDOSCOPY;  Service: Gastroenterology;  Laterality: N/A;  . HOT HEMOSTASIS N/A 10/19/2017   Procedure: HOT HEMOSTASIS (ARGON PLASMA COAGULATION/BICAP);  Surgeon: Otis Brace, MD;  Location: [facility] ENDOSCOPY;  Service: Gastroenterology;  Laterality: N/A;  . IR IMAGING GUIDED PORT INSERTION  07/08/2018  . L shoulder Surgery  2011  . SUBMUCOSAL INJECTION  09/22/2017   Procedure: SUBMUCOSAL INJECTION;  Surgeon: Clarene Essex, MD;  Location: [facility] ENDOSCOPY;  Service: Endoscopy;;    I have reviewed the social history and family history with the patient and they are unchanged from previous note.  ALLERGIES:  is allergic to feraheme [ferumoxytol]; nsaids; tomato; and wasp venom.  MEDICATIONS:  Current Outpatient Medications  Medication Sig Dispense Refill  . albuterol (PROVENTIL HFA;VENTOLIN HFA) 108 (90 Base) MCG/ACT inhaler Inhale 1-2 puffs into the lungs every 6 (six) hours as needed for wheezing or shortness of breath. 1 Inhaler 3  . ALPRAZolam (XANAX) 0.5 MG tablet Take 1 tablet (0.5 mg total) by mouth 2 (two) times daily as needed for anxiety. 60 tablet 1  . Aminocaproic Acid (AMICAR) 1000 MG TABS Take 1 tablet (1,000 mg total) by mouth 2 (two) times daily. 60 each 1  . amLODipine (NORVASC) 10 MG tablet Take 1 tablet (10 mg total) by mouth daily. [place]  tablet 0  . atorvastatin (LIPITOR) 80 MG tablet TAKE 1 TABLET(80 MG) BY MOUTH DAILY 30 tablet 3  . hydrochlorothiazide  (HYDRODIURIL) 12.5 MG tablet TAKE 1 TABLET(12.5 MG) BY MOUTH DAILY 90 tablet 0  . lidocaine-prilocaine (EMLA) cream Apply 1 application topically as needed. 30 g 0  . pantoprazole (PROTONIX) 40 MG tablet Take 1 tablet (40 mg total) by mouth daily. 30 tablet 3  . prochlorperazine (COMPAZINE) 10 MG tablet Take 1 tablet (10 mg total) by mouth every 6 (six) hours as needed for nausea or vomiting. 30 tablet 1  . sertraline (ZOLOFT) 50 MG tablet TAKE 1 TABLET(50 MG) BY MOUTH DAILY 90 tablet 0  . tamoxifen (NOLVADEX) 20 MG tablet Take 1 tablet (20 mg total) by mouth daily. 90 tablet 3  . ferrous gluconate (FERGON) 324 MG tablet Take 1 tablet (324 mg total) by mouth 3 (three) times daily with meals. 90 tablet 0   No current facility-administered medications for this visit.    Facility-Administered Medications Ordered in Other Visits  Medication Dose Route Frequency Provider Last Rate Last Dose  . 0.9 %  sodium chloride infusion   Intravenous Once Truitt Merle, MD        PHYSICAL EXAMINATION: ECOG PERFORMANCE STATUS: 2 - Symptomatic, <50% confined to bed  Vitals:   08/28/18 0953  BP: (!) 156/74  Pulse: 79  Resp: 18  Temp: 98.9 F (37.2 C)  SpO2: 100%   Filed Weights   08/28/18 0953  Weight: 278 lb 9.6 oz (126.4 kg)    GENERAL:alert, no distress and comfortable SKIN: skin color, texture, turgor are normal, no rashes or significant lesions EYES: normal, Conjunctiva are pink and non-injected, sclera clear OROPHARYNX:no exudate, no erythema and lips, buccal mucosa, and tongue normal  NECK: supple, thyroid normal size, non-tender, without nodularity LYMPH:  no palpable lymphadenopathy in the cervical, axillary or inguinal LUNGS: clear to auscultation and percussion with normal breathing effort HEART: regular rate & rhythm and no murmurs and no lower extremity edema ABDOMEN:abdomen soft, non-tender and normal bowel sounds Musculoskeletal:no cyanosis of digits and no clubbing  NEURO: alert &  oriented x 3 with fluent speech, no focal motor/sensory deficits  LABORATORY DATA:  I have reviewed the data as listed CBC Latest Ref Rng & Units 08/28/2018 08/21/2018 08/14/2018  WBC 4.0 - 10.5 K/uL 9.7 10.5 -  Hemoglobin 12.0 - 15.0 g/dL 7.8(L) 6.7(LL) 5.0(LL)  Hematocrit 36.0 - 46.0 % 27.3(L) 23.5(L) 17.0(L)  Platelets 150 - 400 K/uL 311 253 -     CMP Latest Ref Rng & Units 03/20/2018 02/19/2018 02/06/2018  Glucose 70 - 99 mg/dL 126(H) 148(H) 147(H)  BUN 6 - 20 mg/dL 11 9 11   Creatinine 0.44 - 1.00 mg/dL 0.86 0.85 0.98  Sodium 135 - 145 mmol/L 142 141 144  Potassium 3.5 - 5.1 mmol/L 3.6 3.7 3.6  Chloride 98 - 111 mmol/L 107 109 109  CO2 22 - 32 mmol/L 25 23 23   Calcium 8.9 - 10.3 mg/dL 8.8(L) 9.1 9.0  Total Protein 6.5 - 8.1 g/dL 7.3 7.6 7.3  Total Bilirubin 0.3 - 1.2 mg/dL 0.2(L) <0.2(L) 0.2(L)  Alkaline Phos 38 - 126 U/L 76 86 82  AST 15 - 41 U/L 11(L) 11(L) 10(L)  ALT 0 - 44 U/L 6 7 7       RADIOGRAPHIC STUDIES: I have personally reviewed the radiological images as listed and agreed with the findings in the report. No results found.   ASSESSMENT & PLAN:  CHARIAH BAILEY is  a 58 y.o. 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 Extensive workup found the pt to have HHT based on her personal and familyhistoryand recurrentAVM GI bleedings. -Sheis s/pIR embolization of the left Gastric and short gastric artery branchvesselswithout evidence of residual flow in the Dieulafoy lesionon 12/10/2017. -[facility]here[facility] and then every 2 weeks9/13/19-11/8/19 -She is currently onTamoxifen and Amicar for HHT  -She hashad recurrent GI bleeding and epistaxis again in December 2019. I have restarted IV iron, Avastinin 03/2018. She responded well  -she had  severe anemia requiring blood transfusion again after a break of Avastin and f/u for a month -She is tolerating treatment well with occasional nose bleeds and elevated BP (156/74). Will monitor.  -I recommended increase in melatonin or OTC benadryl for recent trouble sleeping.  -CBC revewied today, Hg improved to 7.8, ANC 8. No blood transfusion today. Will proceed with Avastin and IV iron today.  -Continue Tamoxifen and Amicar daily.  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 -She previously noted she is no longer taking Oral Fergon 1 tab TID -Today herHemoglobinimproved to 7.8 (08/28/18). Will give IV iron, Avastin today   3. Reactive Leukocytosis and reactive thrombocytosis  -She has mild leukocytosis with dominant neutrophils, likely reactive -Also has intermittent to moderate thrombocytosis, likely reactive to iron deficiency -continue monitoring -WBC normalized today (08/28/18)  4. 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 -I recommend she continue to follow up with her GI, Dr. Cristina Gong -She denies blood in stool this week.  -Due to increasedEpistaxis, IpreviouslystartedheronAmicar1 g twice daily on 07/17/18  -She has had occasional epistaxis lately.   5. Smoking cessation, Cough  -She smokes about 10 cigarettes a day  -Ipreviously recommendedshe try to cut backand eventually quitcompletely.  -Mild Cough not mentioned today, likely much improved.    PLAN: -I refilled her Lipitor today but encouraged her to contact PCP for this in the future  -Labs reviewed, Hg at 7.8. Will proceed with IV iron, Avastin today  -iv ferric gluconate every 1-2 week unless ferritin >100 -Avastin every 2  weeks -Continue Tamoxifen and Amicar daily  -lab in one week -Lab and f/u in 2 weeks    No problem-specific Assessment & Plan notes found for this encounter.   No orders of the defined types were placed in this encounter.  All questions were answered. The patient knows to call the clinic with any problems, questions or concerns. No barriers to learning was detected. I spent 15 minutes counseling the patient face to face. The total time spent in the appointment was 20 minutes and more than 50% was on counseling and review of test results     Truitt Merle, MD 08/28/2018   I, Joslyn Devon, am acting as scribe for Truitt Merle, MD.   I have reviewed the above documentation for accuracy and completeness, and I agree with the above.

## 2018-08-28 ENCOUNTER — Inpatient Hospital Stay: Payer: Medicaid Other

## 2018-08-28 ENCOUNTER — Other Ambulatory Visit: Payer: Self-pay

## 2018-08-28 ENCOUNTER — Inpatient Hospital Stay (HOSPITAL_BASED_OUTPATIENT_CLINIC_OR_DEPARTMENT_OTHER): Payer: Medicaid Other | Admitting: Hematology

## 2018-08-28 ENCOUNTER — Other Ambulatory Visit: Payer: Medicaid Other

## 2018-08-28 ENCOUNTER — Encounter: Payer: Self-pay | Admitting: Hematology

## 2018-08-28 ENCOUNTER — Telehealth: Payer: Self-pay | Admitting: Hematology

## 2018-08-28 VITALS — BP 148/69 | HR 65 | Resp 16

## 2018-08-28 VITALS — BP 156/74 | HR 79 | Temp 98.9°F | Resp 18 | Ht 65.0 in | Wt 278.6 lb

## 2018-08-28 DIAGNOSIS — Z5112 Encounter for antineoplastic immunotherapy: Secondary | ICD-10-CM | POA: Diagnosis not present

## 2018-08-28 DIAGNOSIS — D500 Iron deficiency anemia secondary to blood loss (chronic): Secondary | ICD-10-CM

## 2018-08-28 DIAGNOSIS — M1990 Unspecified osteoarthritis, unspecified site: Secondary | ICD-10-CM | POA: Diagnosis not present

## 2018-08-28 DIAGNOSIS — F17210 Nicotine dependence, cigarettes, uncomplicated: Secondary | ICD-10-CM

## 2018-08-28 DIAGNOSIS — I780 Hereditary hemorrhagic telangiectasia: Secondary | ICD-10-CM

## 2018-08-28 DIAGNOSIS — R05 Cough: Secondary | ICD-10-CM

## 2018-08-28 DIAGNOSIS — D72828 Other elevated white blood cell count: Secondary | ICD-10-CM | POA: Diagnosis not present

## 2018-08-28 DIAGNOSIS — E785 Hyperlipidemia, unspecified: Secondary | ICD-10-CM

## 2018-08-28 DIAGNOSIS — I10 Essential (primary) hypertension: Secondary | ICD-10-CM

## 2018-08-28 DIAGNOSIS — D508 Other iron deficiency anemias: Secondary | ICD-10-CM

## 2018-08-28 DIAGNOSIS — Z79899 Other long term (current) drug therapy: Secondary | ICD-10-CM

## 2018-08-28 LAB — CBC WITH DIFFERENTIAL (CANCER CENTER ONLY)
Abs Immature Granulocytes: 0.05 10*3/uL (ref 0.00–0.07)
Basophils Absolute: 0 10*3/uL (ref 0.0–0.1)
Basophils Relative: 0 %
Eosinophils Absolute: 0.1 10*3/uL (ref 0.0–0.5)
Eosinophils Relative: 1 %
HCT: 27.3 % — ABNORMAL LOW (ref 36.0–46.0)
Hemoglobin: 7.8 g/dL — ABNORMAL LOW (ref 12.0–15.0)
Immature Granulocytes: 1 %
Lymphocytes Relative: 13 %
Lymphs Abs: 1.2 10*3/uL (ref 0.7–4.0)
MCH: 24.4 pg — ABNORMAL LOW (ref 26.0–34.0)
MCHC: 28.6 g/dL — ABNORMAL LOW (ref 30.0–36.0)
MCV: 85.3 fL (ref 80.0–100.0)
Monocytes Absolute: 0.3 10*3/uL (ref 0.1–1.0)
Monocytes Relative: 3 %
Neutro Abs: 8 10*3/uL — ABNORMAL HIGH (ref 1.7–7.7)
Neutrophils Relative %: 82 %
Platelet Count: 311 10*3/uL (ref 150–400)
RBC: 3.2 MIL/uL — ABNORMAL LOW (ref 3.87–5.11)
RDW: 22.8 % — ABNORMAL HIGH (ref 11.5–15.5)
WBC Count: 9.7 10*3/uL (ref 4.0–10.5)
nRBC: 0 % (ref 0.0–0.2)

## 2018-08-28 LAB — SAMPLE TO BLOOD BANK

## 2018-08-28 MED ORDER — HEPARIN SOD (PORK) LOCK FLUSH 100 UNIT/ML IV SOLN
500.0000 [IU] | Freq: Once | INTRAVENOUS | Status: AC | PRN
  Administered 2018-08-28: 500 [IU]
  Filled 2018-08-28: qty 5

## 2018-08-28 MED ORDER — ATORVASTATIN CALCIUM 80 MG PO TABS: ORAL_TABLET | ORAL | 3 refills | Status: DC

## 2018-08-28 MED ORDER — SODIUM CHLORIDE 0.9% FLUSH
10.0000 mL | INTRAVENOUS | Status: DC | PRN
  Administered 2018-08-28: 10 mL
  Filled 2018-08-28: qty 10

## 2018-08-28 MED ORDER — SODIUM CHLORIDE 0.9 % IV SOLN
Freq: Once | INTRAVENOUS | Status: AC
  Administered 2018-08-28: 11:00:00 via INTRAVENOUS
  Filled 2018-08-28: qty 250

## 2018-08-28 MED ORDER — SODIUM CHLORIDE 0.9% FLUSH
10.0000 mL | Freq: Once | INTRAVENOUS | Status: AC | PRN
  Administered 2018-08-28: 10 mL
  Filled 2018-08-28: qty 10

## 2018-08-28 MED ORDER — SODIUM CHLORIDE 0.9 % IV SOLN
5.3000 mg/kg | Freq: Once | INTRAVENOUS | Status: AC
  Administered 2018-08-28: 600 mg via INTRAVENOUS
  Filled 2018-08-28: qty 8

## 2018-08-28 MED ORDER — SODIUM CHLORIDE 0.9 % IV SOLN
125.0000 mg | Freq: Once | INTRAVENOUS | Status: AC
  Administered 2018-08-28: 12:00:00 125 mg via INTRAVENOUS
  Filled 2018-08-28: qty 10

## 2018-08-28 NOTE — Patient Instructions (Signed)
[place] Discharge Instructions for Patients Receiving Chemotherapy  Today you received the following chemotherapy agents Avastin.  To help prevent nausea and vomiting after your treatment, we encourage you to take your nausea medication as directed.  If you develop nausea and vomiting that is not controlled by your nausea medication, call the clinic.   BELOW ARE SYMPTOMS THAT SHOULD BE REPORTED IMMEDIATELY:  *FEVER GREATER THAN 100.5 F  *CHILLS WITH OR WITHOUT FEVER  NAUSEA AND VOMITING THAT IS NOT CONTROLLED WITH YOUR NAUSEA MEDICATION  *UNUSUAL SHORTNESS OF BREATH  *UNUSUAL BRUISING OR BLEEDING  TENDERNESS IN MOUTH AND THROAT WITH OR WITHOUT PRESENCE OF ULCERS  *URINARY PROBLEMS  *BOWEL PROBLEMS  UNUSUAL RASH Items with * indicate a potential emergency and should be followed up as soon as possible.  Feel free to call the clinic should you have any questions or concerns. The clinic phone number is [phone].  Please show the [place] at check-in to the Emergency Department and triage nurse.  Sodium Ferric Gluconate Complex injection What is this medicine? SODIUM FERRIC GLUCONATE COMPLEX (SOE dee um FER ik GLOO koe nate KOM pleks) is an iron replacement. It is used with epoetin therapy to treat low iron levels in patients who are receiving hemodialysis. This medicine may be used for other purposes; ask your health care provider or pharmacist if you have questions. COMMON BRAND NAME(S): Ferrlecit, Nulecit What should I tell my health care provider before I take this medicine? They need to know if you have any of the following conditions: -anemia that is not from iron deficiency -high levels of iron in the body -an unusual or allergic reaction to iron, benzyl alcohol, other medicines, foods, dyes, or preservatives -pregnant or are trying to become pregnant -breast-feeding How should I use this medicine? This medicine is for infusion into a  vein. It is given by a health care professional in a hospital or clinic setting. Talk to your pediatrician regarding the use of this medicine in children. While this drug may be prescribed for children as young as 13 years old for selected conditions, precautions do apply. Overdosage: If you think you have taken too much of this medicine contact a poison control center or emergency room at once. NOTE: This medicine is only for you. Do not share this medicine with others. What if I miss a dose? It is important not to miss your dose. Call your doctor or health care professional if you are unable to keep an appointment. What may interact with this medicine? Do not take this medicine with any of the following medications: -deferoxamine -dimercaprol -other iron products This medicine may also interact with the following medications: -chloramphenicol -deferasirox -medicine for blood pressure like enalapril This list may not describe all possible interactions. Give your health care provider a list of all the medicines, herbs, non-prescription drugs, or dietary supplements you use. Also tell them if you smoke, drink alcohol, or use illegal drugs. Some items may interact with your medicine. What should I watch for while using this medicine? Your condition will be monitored carefully while you are receiving this medicine. Visit your doctor for check-ups as directed. What side effects may I notice from receiving this medicine? Side effects that you should report to your doctor or health care professional as soon as possible: -allergic reactions like skin rash, itching or hives, swelling of the face, lips, or tongue -breathing problems -changes in hearing -changes in vision -chills, flushing, or sweating -fast,  irregular heartbeat -feeling faint or lightheaded, falls -fever, flu-like symptoms -high or low blood pressure -pain, tingling, numbness in the hands or feet -severe pain in the chest, back,  flanks, or groin -swelling of the ankles, feet, hands -trouble passing urine or change in the amount of urine -unusually weak or tired Side effects that usually do not require medical attention (report to your doctor or health care professional if they continue or are bothersome): -cramps -dark colored stools -diarrhea -headache -nausea, vomiting -stomach upset This list may not describe all possible side effects. Call your doctor for medical advice about side effects. You may report side effects to FDA at 1-800-FDA-1088. Where should I keep my medicine? This drug is given in a hospital or clinic and will not be stored at home. NOTE: This sheet is a summary. It may not cover all possible information. If you have questions about this medicine, talk to your doctor, pharmacist, or health care provider.  2019 Elsevier/Gold Standard (2007-12-02 15:58:57)

## 2018-08-28 NOTE — Telephone Encounter (Signed)
No los per 5/15. °

## 2018-08-31 DIAGNOSIS — M1990 Unspecified osteoarthritis, unspecified site: Secondary | ICD-10-CM | POA: Diagnosis not present

## 2018-09-01 DIAGNOSIS — M1990 Unspecified osteoarthritis, unspecified site: Secondary | ICD-10-CM | POA: Diagnosis not present

## 2018-09-02 DIAGNOSIS — M1990 Unspecified osteoarthritis, unspecified site: Secondary | ICD-10-CM | POA: Diagnosis not present

## 2018-09-03 DIAGNOSIS — M1990 Unspecified osteoarthritis, unspecified site: Secondary | ICD-10-CM | POA: Diagnosis not present

## 2018-09-04 ENCOUNTER — Inpatient Hospital Stay: Payer: Medicaid Other

## 2018-09-04 DIAGNOSIS — M1990 Unspecified osteoarthritis, unspecified site: Secondary | ICD-10-CM | POA: Diagnosis not present

## 2018-09-04 DIAGNOSIS — I780 Hereditary hemorrhagic telangiectasia: Secondary | ICD-10-CM | POA: Diagnosis not present

## 2018-09-07 DIAGNOSIS — M1990 Unspecified osteoarthritis, unspecified site: Secondary | ICD-10-CM | POA: Diagnosis not present

## 2018-09-08 DIAGNOSIS — M1990 Unspecified osteoarthritis, unspecified site: Secondary | ICD-10-CM | POA: Diagnosis not present

## 2018-09-09 DIAGNOSIS — M1990 Unspecified osteoarthritis, unspecified site: Secondary | ICD-10-CM | POA: Diagnosis not present

## 2018-09-09 NOTE — Progress Notes (Signed)
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 She presents to the clinic alone. She notes she feels better than prior weeks. She notes 2 days ago she had significant epistaxis for 45 minutes. She did not apply pressure so blood clots were still drinking. She did use tissue sprayed with nose spray to help.  Her stool is brown she has not had black stool since last visit. She denies any new changes or issues. She notes her SOB is improving. She notes she is still taking amicar. She notes she ran out of amicar when her epistaxis episode occurred.  She notes she may see GI for endoscopy this year. She notes having a dark circle of anterior right ankle which is painful. She is able to walk still.    REVIEW OF SYSTEMS:   Constitutional: Denies fevers, chills or abnormal weight loss Eyes: Denies blurriness of vision Ears, nose, mouth, throat, and face: Denies mucositis or sore throat Respiratory: Denies cough or wheezes  Cardiovascular: Denies palpitation, chest  discomfort or lower extremity swelling Gastrointestinal:  Denies nausea, heartburn or change in bowel habits Skin: Denies abnormal skin rashes (+) dark circle of anterior right ankle which is painful Lymphatics: Denies new lymphadenopathy or easy bruising Neurological:Denies numbness, tingling or new weaknesses Behavioral/Psych: Mood is stable, no new changes  All other systems were reviewed with the patient and are negative.  MEDICAL HISTORY:  Past Medical History:  Diagnosis Date  . Anxiety   . Arthritis    knnes,back  . GERD (gastroesophageal reflux disease)   . 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 History of swelling of feet   . Hyperlipidemia   . Hypertension   . Major depressive disorder, recurrent episode ([place]) 06/05/2015  . Obesity   . Snores     SURGICAL HISTORY: Past Surgical History:  Procedure Laterality Date  . ABDOMINAL HYSTERECTOMY    . CARPAL TUNNEL RELEASE  05/13/2011   Procedure: CARPAL TUNNEL RELEASE;  Surgeon: Nita Sells, MD;  Location: [place];  Service: Orthopedics;  Laterality: Left;  . COLONOSCOPY WITH PROPOFOL N/A 04/28/2014   Procedure: COLONOSCOPY WITH PROPOFOL;  Surgeon: Cleotis Nipper, MD;  Location: [facility] ENDOSCOPY;  Service: Endoscopy;  Laterality: N/A;  . DG TOES*L*  2/10   rt  . DILATION AND CURETTAGE OF UTERUS    . ENTEROSCOPY N/A 10/17/2017   Procedure: ENTEROSCOPY;  Surgeon: Otis Brace, MD;  Location: [place] ENDOSCOPY;  Service: Gastroenterology;  Laterality: N/A;  . ESOPHAGOGASTRODUODENOSCOPY N/A 04/10/2014   Procedure: ESOPHAGOGASTRODUODENOSCOPY (EGD);  Surgeon: [name], MD;  Location: [facility] ENDOSCOPY;  Service: Endoscopy;  Laterality: N/A;  .  ESOPHAGOGASTRODUODENOSCOPY N/A 05/10/2017   Procedure: ESOPHAGOGASTRODUODENOSCOPY (EGD);  Surgeon: Ronald Lobo, MD;  Location: [facility] ENDOSCOPY;  Service: Endoscopy;  Laterality: N/A;  . ESOPHAGOGASTRODUODENOSCOPY N/A 09/22/2017   Procedure:  ESOPHAGOGASTRODUODENOSCOPY (EGD);  Surgeon: Clarene Essex, MD;  Location: [place];  Service: Endoscopy;  Laterality: N/A;  bedside  . ESOPHAGOGASTRODUODENOSCOPY (EGD) WITH PROPOFOL N/A 04/27/2014   Procedure: ESOPHAGOGASTRODUODENOSCOPY (EGD) WITH PROPOFOL;  Surgeon: Cleotis Nipper, MD;  Location: [place];  Service: Endoscopy;  Laterality: N/A;  possible apc  . ESOPHAGOGASTRODUODENOSCOPY (EGD) WITH PROPOFOL N/A 09/30/2017   Procedure: ESOPHAGOGASTRODUODENOSCOPY (EGD) WITH PROPOFOL;  Surgeon: [name], MD;  Location: [place];  Service: Gastroenterology;  Laterality: N/A;  . ESOPHAGOGASTRODUODENOSCOPY (EGD) WITH PROPOFOL N/A 10/01/2017   Procedure: ESOPHAGOGASTRODUODENOSCOPY (EGD) WITH PROPOFOL;  Surgeon: [name], MD;  Location: [place];  Service: Gastroenterology;  Laterality: N/A;  . ESOPHAGOGASTRODUODENOSCOPY (EGD) WITH PROPOFOL N/A 10/08/2017   Procedure: ESOPHAGOGASTRODUODENOSCOPY (EGD) WITH PROPOFOL;  Surgeon: Otis Brace, MD;  Location: [facility] ENDOSCOPY;  Service: Gastroenterology;  Laterality: N/A;  . ESOPHAGOGASTRODUODENOSCOPY (EGD) WITH PROPOFOL N/A 10/17/2017   Procedure: ESOPHAGOGASTRODUODENOSCOPY (EGD) WITH PROPOFOL;  Surgeon: Otis Brace, MD;  Location: [facility] ENDOSCOPY;  Service: Gastroenterology;  Laterality: N/A;  . ESOPHAGOGASTRODUODENOSCOPY (EGD) WITH PROPOFOL N/A 10/19/2017   Procedure: ESOPHAGOGASTRODUODENOSCOPY (EGD) WITH PROPOFOL;  Surgeon: Otis Brace, MD;  Location: [facility] ENDOSCOPY;  Service: Gastroenterology;  Laterality: N/A;  . GIVENS CAPSULE STUDY N/A 10/02/2017   Procedure: GIVENS CAPSULE STUDY;  Surgeon: [name], MD;  Location: [place];  Service: Gastroenterology;  Laterality: N/A;  . GIVENS CAPSULE STUDY N/A 10/08/2017   Procedure: GIVENS CAPSULE STUDY;  Surgeon: Otis Brace, MD;  Location: [place];  Service: Gastroenterology;  Laterality: N/A;  endoscopic placement of capsule  . HOT HEMOSTASIS N/A 04/27/2014   Procedure: HOT  HEMOSTASIS (ARGON PLASMA COAGULATION/BICAP);  Surgeon: Cleotis Nipper, MD;  Location: [facility] ENDOSCOPY;  Service: Endoscopy;  Laterality: N/A;  . HOT HEMOSTASIS N/A 09/30/2017   Procedure: HOT HEMOSTASIS (ARGON PLASMA COAGULATION/BICAP);  Surgeon: [name], MD;  Location: [place];  Service: Gastroenterology;  Laterality: N/A;  . HOT HEMOSTASIS N/A 10/01/2017   Procedure: HOT HEMOSTASIS (ARGON PLASMA COAGULATION/BICAP);  Surgeon: [name], MD;  Location: [place];  Service: Gastroenterology;  Laterality: N/A;  . HOT HEMOSTASIS N/A 10/17/2017   Procedure: HOT HEMOSTASIS (ARGON PLASMA COAGULATION/BICAP);  Surgeon: Otis Brace, MD;  Location: [facility] ENDOSCOPY;  Service: Gastroenterology;  Laterality: N/A;  . HOT HEMOSTASIS N/A 10/19/2017   Procedure: HOT HEMOSTASIS (ARGON PLASMA COAGULATION/BICAP);  Surgeon: Otis Brace, MD;  Location: [facility] ENDOSCOPY;  Service: Gastroenterology;  Laterality: N/A;  . IR IMAGING GUIDED PORT INSERTION  07/08/2018  . L shoulder Surgery  2011  . SUBMUCOSAL INJECTION  09/22/2017   Procedure: SUBMUCOSAL INJECTION;  Surgeon: Clarene Essex, MD;  Location: [facility] ENDOSCOPY;  Service: Endoscopy;;    I have reviewed the social history and family history with the patient and they are unchanged from previous note.  ALLERGIES:  is allergic to feraheme [ferumoxytol]; nsaids; tomato; and wasp venom.  MEDICATIONS:  Current Outpatient Medications  Medication Sig Dispense Refill  . albuterol (PROVENTIL HFA;VENTOLIN HFA) 108 (90 Base) MCG/ACT inhaler Inhale 1-2 puffs into the lungs every 6 (six) hours as needed for wheezing or shortness of breath. 1 Inhaler 3  . ALPRAZolam (XANAX) 0.5 MG tablet Take 1 tablet (0.5 mg total) by mouth 2 (two) times daily as needed for anxiety. 60 tablet 1  . Aminocaproic Acid (AMICAR) 1000 MG TABS Take 1 tablet (1,000 mg total)  by mouth 2 (two) times daily. 60 each 3  . amLODipine (NORVASC) 10 MG tablet Take 1 tablet (10 mg total) by mouth daily.  90 tablet 0  . atorvastatin (LIPITOR) 80 MG tablet TAKE 1 TABLET(80 MG) BY MOUTH DAILY 30 tablet 3  . hydrochlorothiazide (HYDRODIURIL) 12.5 MG tablet TAKE 1 TABLET(12.5 MG) BY MOUTH DAILY 90 tablet 0  . lidocaine-prilocaine (EMLA) cream Apply 1 application topically as needed. 30 g 0  . pantoprazole (PROTONIX) 40 MG tablet Take 1 tablet (40 mg total) by mouth daily. 30 tablet 3  . prochlorperazine (COMPAZINE) 10 MG tablet Take 1 tablet (10 mg total) by mouth every 6 (six) hours as needed for nausea or vomiting. 30 tablet 1  . sertraline (ZOLOFT) 50 MG tablet TAKE 1 TABLET(50 MG) BY MOUTH DAILY 90 tablet 0  . tamoxifen (NOLVADEX) 20 MG tablet Take 1 tablet (20 mg total) by mouth daily. 90 tablet 3  . ferrous gluconate (FERGON) 324 MG tablet Take 1 tablet (324 mg total) by mouth 3 (three) times daily with meals. 90 tablet 0   No current facility-administered medications for this visit.    Facility-Administered Medications Ordered in Other Visits  Medication Dose Route Frequency Provider Last Rate Last Dose  . 0.9 %  sodium chloride infusion   Intravenous Once Truitt Merle, MD      . bevacizumab (AVASTIN) 600 mg in sodium chloride 0.9 % 100 mL chemo infusion  5.3 mg/kg (Treatment Plan Recorded) Intravenous Once Truitt Merle, MD      . heparin lock flush 100 unit/mL  500 Units Intracatheter Once PRN Truitt Merle, MD      . sodium chloride flush (NS) 0.9 % injection 10 mL  10 mL Intracatheter PRN Truitt Merle, MD        PHYSICAL EXAMINATION: ECOG PERFORMANCE STATUS: 2 - Symptomatic, <50% confined to bed  Vitals:   09/11/18 1007  BP: (!) 143/76  Pulse: 78  Resp: 18  Temp: 98.6 F (37 C)  SpO2: 100%   Filed Weights   09/11/18 1007  Weight: 276 lb 14.4 oz (125.6 kg)    GENERAL:alert, no distress and comfortable SKIN: skin texture, turgor are normal, no rashes or significant lesions (+) dark circle of anterior right ankle which is painful.  EYES: normal, Conjunctiva are pink and non-injected,  sclera clear  NECK: supple, thyroid normal size, non-tender, without nodularity LYMPH:  no palpable lymphadenopathy in the cervical, axillary  LUNGS: clear to auscultation and percussion with normal breathing effort HEART: regular rate & rhythm and no murmurs (+) Mild lower extremity edema ABDOMEN:abdomen soft, non-tender and normal bowel sounds Musculoskeletal:no cyanosis of digits and no clubbing  NEURO: alert & oriented x 3 with fluent speech, no focal motor/sensory deficits  LABORATORY DATA:  I have reviewed the data as listed CBC Latest Ref Rng & Units 09/11/2018 08/28/2018 08/21/2018  WBC 4.0 - 10.5 K/uL 11.1(H) 9.7 10.5  Hemoglobin 12.0 - 15.0 g/dL 7.7(L) 7.8(L) 6.7(LL)  Hematocrit 36.0 - 46.0 % 27.6(L) 27.3(L) 23.5(L)  Platelets 150 - 400 K/uL 231 311 253     CMP Latest Ref Rng & Units 09/11/2018 03/20/2018 02/19/2018  Glucose 70 - 99 mg/dL 164(H) 126(H) 148(H)  BUN 6 - 20 mg/dL 12 11 9   Creatinine 0.44 - 1.00 mg/dL 0.89 0.86 0.85  Sodium 135 - 145 mmol/L 140 142 141  Potassium 3.5 - 5.1 mmol/L 3.5 3.6 3.7  Chloride 98 - 111 mmol/L 107 107 109  CO2 22 - 32 mmol/L  25 25 23   Calcium 8.9 - 10.3 mg/dL 8.8(L) 8.8(L) 9.1  Total Protein 6.5 - 8.1 g/dL 6.9 7.3 7.6  Total Bilirubin 0.3 - 1.2 mg/dL 0.3 0.2(L) <0.2(L)  Alkaline Phos 38 - 126 U/L 80 76 86  AST 15 - 41 U/L 11(L) 11(L) 11(L)  ALT 0 - 44 U/L 7 <6 7      RADIOGRAPHIC STUDIES: I have personally reviewed the radiological images as listed and agreed with the findings in the report. No results found.   ASSESSMENT & PLAN:  Peggy House is a 58 y.o. 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 Extensive workup found the pt to have HHT based on her personal and familyhistoryand recurrentAVM GI bleedings. -Sheis s/pIR  embolization of the left Gastric and short gastric artery branchvesselswithout evidence of residual flow in the Dieulafoy lesionon 12/10/2017. -ShereceivedAv[facility] and then every 2 weeks9/13/19-11/8/19 -She is currently onTamoxifen and Amicar for HHT  -She hashad recurrent GI bleeding and epistaxis again in December 2019. I have restartedIV iron,Avastinin 03/2018. She responded well  -Labs reviewed, CBC WNL except WBC 11.1, Hg 7.7, ANC 9, Urine protein 30, CMP WNL except glucose 164. Iron panel is still pending. Overall adequate to proceed with IV Iron and Avastin today.  -Continue Tamoxifenand Amicar daily.IV ferric gluconate every 1-2 week unless ferritin >100, Avastin every 2 weeks.  -I encouraged her to elevate her feet at home for her LE edema.  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 -She previously noted she is no longer takingOral Fergon 1 tab TID -Hg stable at 7.7 today (09/11/18) Iron panel is still pending. Will give IV iron, Avastin today   3. Reactive Leukocytosis and reactive thrombocytosis  -She has mild leukocytosis with dominant neutrophils, likely reactive -Also has intermittent to moderate thrombocytosis, likely reactive to iron deficiency -continue monitoring -WBC increased to 11.1 today (09/11/18)  4. 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 -I recommend she continue to follow up with her GI, Dr. Cristina Gong -She denies blood in stool this week. -Due to increasedEpistaxis, IpreviouslystartedheronAmicar1 g twice dailyon4/3/20  -She has had occasional epistaxis lately.  -She ran out of  amicar and had 1 episode of significant epistaxis. I advised her to apply pressure to 1 nostril at a time during epistaxis. I also advised her to refill her Amicar at least 1 week for she runs out.     PLAN: -Labs reviewed, Hg at 7.8. Will proceed with IV iron, Avastin today  -Continue Tamoxifenand Amicardaily -Lab and flush in 1, 2 and 4 weeks  -Avastin and iron gluconate in 2 and 4 weeks  -F/u in 4 weeks, or sooner if needed     No problem-specific Assessment & Plan notes found for this encounter.   No orders of the defined types were placed in this encounter.  All questions were answered. The patient knows to call the clinic with any problems, questions or concerns. No barriers to learning was detected. I  spent 15 minutes counseling the patient face to face. The total time spent in the appointment was 20 minutes and more than 50% was on counseling and review of test results     Truitt Merle, MD 09/11/2018   I, Joslyn Devon, am acting as scribe for Truitt Merle, MD.   I have reviewed the above documentation for accuracy and completeness, and I agree with the above.

## 2018-09-10 ENCOUNTER — Other Ambulatory Visit: Payer: Self-pay

## 2018-09-10 DIAGNOSIS — M1990 Unspecified osteoarthritis, unspecified site: Secondary | ICD-10-CM | POA: Diagnosis not present

## 2018-09-10 DIAGNOSIS — I780 Hereditary hemorrhagic telangiectasia: Secondary | ICD-10-CM

## 2018-09-11 ENCOUNTER — Other Ambulatory Visit: Payer: Medicaid Other

## 2018-09-11 ENCOUNTER — Inpatient Hospital Stay: Payer: Medicaid Other

## 2018-09-11 ENCOUNTER — Encounter: Payer: Self-pay | Admitting: Hematology

## 2018-09-11 ENCOUNTER — Inpatient Hospital Stay (HOSPITAL_BASED_OUTPATIENT_CLINIC_OR_DEPARTMENT_OTHER): Payer: Medicaid Other | Admitting: Hematology

## 2018-09-11 ENCOUNTER — Other Ambulatory Visit: Payer: Self-pay

## 2018-09-11 VITALS — BP 143/76 | HR 78 | Temp 98.6°F | Resp 18 | Ht 65.0 in | Wt 276.9 lb

## 2018-09-11 VITALS — BP 162/83 | HR 73 | Temp 98.4°F | Resp 20

## 2018-09-11 DIAGNOSIS — F17210 Nicotine dependence, cigarettes, uncomplicated: Secondary | ICD-10-CM

## 2018-09-11 DIAGNOSIS — I10 Essential (primary) hypertension: Secondary | ICD-10-CM

## 2018-09-11 DIAGNOSIS — I780 Hereditary hemorrhagic telangiectasia: Secondary | ICD-10-CM

## 2018-09-11 DIAGNOSIS — Z79899 Other long term (current) drug therapy: Secondary | ICD-10-CM

## 2018-09-11 DIAGNOSIS — M1990 Unspecified osteoarthritis, unspecified site: Secondary | ICD-10-CM | POA: Diagnosis not present

## 2018-09-11 DIAGNOSIS — D500 Iron deficiency anemia secondary to blood loss (chronic): Secondary | ICD-10-CM

## 2018-09-11 DIAGNOSIS — R040 Epistaxis: Secondary | ICD-10-CM

## 2018-09-11 DIAGNOSIS — D72828 Other elevated white blood cell count: Secondary | ICD-10-CM | POA: Diagnosis not present

## 2018-09-11 DIAGNOSIS — Z95828 Presence of other vascular implants and grafts: Secondary | ICD-10-CM

## 2018-09-11 DIAGNOSIS — D508 Other iron deficiency anemias: Secondary | ICD-10-CM | POA: Diagnosis not present

## 2018-09-11 DIAGNOSIS — Z5112 Encounter for antineoplastic immunotherapy: Secondary | ICD-10-CM | POA: Diagnosis not present

## 2018-09-11 LAB — CBC WITH DIFFERENTIAL (CANCER CENTER ONLY)
Abs Immature Granulocytes: 0.06 10*3/uL (ref 0.00–0.07)
Basophils Absolute: 0 10*3/uL (ref 0.0–0.1)
Basophils Relative: 0 %
Eosinophils Absolute: 0.1 10*3/uL (ref 0.0–0.5)
Eosinophils Relative: 1 %
HCT: 27.6 % — ABNORMAL LOW (ref 36.0–46.0)
Hemoglobin: 7.7 g/dL — ABNORMAL LOW (ref 12.0–15.0)
Immature Granulocytes: 1 %
Lymphocytes Relative: 12 %
Lymphs Abs: 1.3 10*3/uL (ref 0.7–4.0)
MCH: 23.7 pg — ABNORMAL LOW (ref 26.0–34.0)
MCHC: 27.9 g/dL — ABNORMAL LOW (ref 30.0–36.0)
MCV: 84.9 fL (ref 80.0–100.0)
Monocytes Absolute: 0.5 10*3/uL (ref 0.1–1.0)
Monocytes Relative: 4 %
Neutro Abs: 9 10*3/uL — ABNORMAL HIGH (ref 1.7–7.7)
Neutrophils Relative %: 82 %
Platelet Count: 231 10*3/uL (ref 150–400)
RBC: 3.25 MIL/uL — ABNORMAL LOW (ref 3.87–5.11)
RDW: 23.4 % — ABNORMAL HIGH (ref 11.5–15.5)
WBC Count: 11.1 10*3/uL — ABNORMAL HIGH (ref 4.0–10.5)
nRBC: 0.2 % (ref 0.0–0.2)

## 2018-09-11 LAB — CMP (CANCER CENTER ONLY)
ALT: 7 U/L (ref 0–44)
AST: 11 U/L — ABNORMAL LOW (ref 15–41)
Albumin: 3.4 g/dL — ABNORMAL LOW (ref 3.5–5.0)
Alkaline Phosphatase: 80 U/L (ref 38–126)
Anion gap: 8 (ref 5–15)
BUN: 12 mg/dL (ref 6–20)
CO2: 25 mmol/L (ref 22–32)
Calcium: 8.8 mg/dL — ABNORMAL LOW (ref 8.9–10.3)
Chloride: 107 mmol/L (ref 98–111)
Creatinine: 0.89 mg/dL (ref 0.44–1.00)
GFR, Est AFR Am: >60 mL/min (ref >60)
GFR, Estimated: >60 mL/min (ref >60)
Glucose, Bld: 164 mg/dL — ABNORMAL HIGH (ref 70–99)
Potassium: 3.5 mmol/L (ref 3.5–5.1)
Sodium: 140 mmol/L (ref 135–145)
Total Bilirubin: 0.3 mg/dL (ref 0.3–1.2)
Total Protein: 6.9 g/dL (ref 6.5–8.1)

## 2018-09-11 LAB — RETICULOCYTES
Immature Retic Fract: 31.5 % — ABNORMAL HIGH (ref 2.3–15.9)
RBC.: 3.28 MIL/uL — ABNORMAL LOW (ref 3.87–5.11)
Retic Count, Absolute: 92.5 10*3/uL (ref 19.0–186.0)
Retic Ct Pct: 2.8 % (ref 0.4–3.1)

## 2018-09-11 LAB — IRON AND TIBC
Iron: 35 ug/dL — ABNORMAL LOW (ref 41–142)
Saturation Ratios: 13 % — ABNORMAL LOW (ref 21–57)
TIBC: 275 ug/dL (ref 236–444)
UIBC: 241 ug/dL (ref 120–384)

## 2018-09-11 LAB — SAMPLE TO BLOOD BANK

## 2018-09-11 LAB — FERRITIN: Ferritin: 60 ng/mL (ref 11–307)

## 2018-09-11 LAB — TOTAL PROTEIN, URINE DIPSTICK: Protein, ur: 30 mg/dL — AB

## 2018-09-11 MED ORDER — SODIUM CHLORIDE 0.9% FLUSH
10.0000 mL | INTRAVENOUS | Status: DC | PRN
  Administered 2018-09-11: 10 mL via INTRAVENOUS
  Filled 2018-09-11: qty 10

## 2018-09-11 MED ORDER — SODIUM CHLORIDE 0.9 % IV SOLN
5.3000 mg/kg | Freq: Once | INTRAVENOUS | Status: AC
  Administered 2018-09-11: 13:00:00 600 mg via INTRAVENOUS
  Filled 2018-09-11: qty 16

## 2018-09-11 MED ORDER — AMINOCAPROIC ACID 1000 MG PO TABS: 1000.0000 mg | ORAL_TABLET | Freq: Two times a day (BID) | ORAL | 3 refills | Status: DC

## 2018-09-11 MED ORDER — SODIUM CHLORIDE 0.9 % IV SOLN
Freq: Once | INTRAVENOUS | Status: AC
  Administered 2018-09-11: 11:00:00 via INTRAVENOUS
  Filled 2018-09-11: qty 250

## 2018-09-11 MED ORDER — SODIUM CHLORIDE 0.9 % IV SOLN
125.0000 mg | Freq: Once | INTRAVENOUS | Status: AC
  Administered 2018-09-11: 11:00:00 125 mg via INTRAVENOUS
  Filled 2018-09-11: qty 10

## 2018-09-11 MED ORDER — HEPARIN SOD (PORK) LOCK FLUSH 100 UNIT/ML IV SOLN
500.0000 [IU] | Freq: Once | INTRAVENOUS | Status: AC | PRN
  Administered 2018-09-11: 500 [IU]
  Filled 2018-09-11: qty 5

## 2018-09-11 MED ORDER — SODIUM CHLORIDE 0.9% FLUSH
10.0000 mL | INTRAVENOUS | Status: DC | PRN
  Administered 2018-09-11: 10 mL
  Filled 2018-09-11: qty 10

## 2018-09-11 NOTE — Patient Instructions (Addendum)
[facility] Discharge Instructions for Patients Receiving Chemotherapy  Today you received the following Monoclonal Antibody: Avastin and other agent: Ferric Gluconate  To help prevent nausea and vomiting after your treatment, we encourage you to take your nausea medication as directed by your MD.   If you develop nausea and vomiting that is not controlled by your nausea medication, call the clinic.   BELOW ARE SYMPTOMS THAT SHOULD BE REPORTED IMMEDIATELY:  *FEVER GREATER THAN 100.5 F  *CHILLS WITH OR WITHOUT FEVER  NAUSEA AND VOMITING THAT IS NOT CONTROLLED WITH YOUR NAUSEA MEDICATION  *UNUSUAL SHORTNESS OF BREATH  *UNUSUAL BRUISING OR BLEEDING  TENDERNESS IN MOUTH AND THROAT WITH OR WITHOUT PRESENCE OF ULCERS  *URINARY PROBLEMS  *BOWEL PROBLEMS  UNUSUAL RASH Items with * indicate a potential emergency and should be followed up as soon as possible.  Feel free to call the clinic should you have any questions or concerns. The clinic phone number is [phone].  Please show the [facility] at check-in to the Emergency Department and triage nurse.  Sodium Ferric Gluconate Complex injection What is this medicine? SODIUM FERRIC GLUCONATE COMPLEX (SOE dee um FER ik GLOO koe nate KOM pleks) is an iron replacement. It is used with epoetin therapy to treat low iron levels in patients who are receiving hemodialysis. This medicine may be used for other purposes; ask your health care provider or pharmacist if you have questions. COMMON BRAND NAME(S): Ferrlecit, Nulecit What should I tell my health care provider before I take this medicine? They need to know if you have any of the following conditions: -anemia that is not from iron deficiency -high levels of iron in the body -an unusual or allergic reaction to iron, benzyl alcohol, other medicines, foods, dyes, or preservatives -pregnant or are trying to become pregnant -breast-feeding How should I use this  medicine? This medicine is for infusion into a vein. It is given by a health care professional in a hospital or clinic setting. Talk to your pediatrician regarding the use of this medicine in children. While this drug may be prescribed for children as young as 47 years old for selected conditions, precautions do apply. Overdosage: If you think you have taken too much of this medicine contact a poison control center or emergency room at once. NOTE: This medicine is only for you. Do not share this medicine with others. What if I miss a dose? It is important not to miss your dose. Call your doctor or health care professional if you are unable to keep an appointment. What may interact with this medicine? Do not take this medicine with any of the following medications: -deferoxamine -dimercaprol -other iron products This medicine may also interact with the following medications: -chloramphenicol -deferasirox -medicine for blood pressure like enalapril This list may not describe all possible interactions. Give your health care provider a list of all the medicines, herbs, non-prescription drugs, or dietary supplements you use. Also tell them if you smoke, drink alcohol, or use illegal drugs. Some items may interact with your medicine. What should I watch for while using this medicine? Your condition will be monitored carefully while you are receiving this medicine. Visit your doctor for check-ups as directed. What side effects may I notice from receiving this medicine? Side effects that you should report to your doctor or health care professional as soon as possible: -allergic reactions like skin rash, itching or hives, swelling of the face, lips, or tongue -breathing problems -changes in  hearing -changes in vision -chills, flushing, or sweating -fast, irregular heartbeat -feeling faint or lightheaded, falls -fever, flu-like symptoms -high or low blood pressure -pain, tingling, numbness in the  hands or feet -severe pain in the chest, back, flanks, or groin -swelling of the ankles, feet, hands -trouble passing urine or change in the amount of urine -unusually weak or tired Side effects that usually do not require medical attention (report to your doctor or health care professional if they continue or are bothersome): -cramps -dark colored stools -diarrhea -headache -nausea, vomiting -stomach upset This list may not describe all possible side effects. Call your doctor for medical advice about side effects. You may report side effects to FDA at 1-800-FDA-1088. Where should I keep my medicine? This drug is given in a hospital or clinic and will not be stored at home. NOTE: This sheet is a summary. It may not cover all possible information. If you have questions about this medicine, talk to your doctor, pharmacist, or health care provider.  2019 Elsevier/Gold Standard (2007-12-02 15:58:57)   Coronavirus (COVID-19) Are you at risk?  Are you at risk for the Coronavirus (COVID-19)?  To be considered HIGH RISK for Coronavirus (COVID-19), you have to meet the following criteria:  . Traveled to [place], [place], [place], [place] or [place]; or in the [place] to [place], [place], [place], or [place]; and have fever, cough, and shortness of breath within the last 2 weeks of travel OR . Been in close contact with a person diagnosed with COVID-19 within the last 2 weeks and have fever, cough, and shortness of breath . IF YOU DO NOT MEET THESE CRITERIA, YOU ARE CONSIDERED LOW RISK FOR COVID-19.  What to do if you are HIGH RISK for COVID-19?  [name] If you are having a medical emergency, call 911. . Seek medical care right away. Before you go to a doctor's office, urgent care or emergency department, call ahead and tell them about your recent travel, contact with someone diagnosed with COVID-19, and your symptoms. You should receive instructions from your physician's office  regarding next steps of care.  . When you arrive at healthcare provider, tell the healthcare staff immediately you have returned from visiting [place], [place], [place], [place] or [place]; or traveled in the [place] to [place], [place], [place], or [place]; in the last two weeks or you have been in close contact with a person diagnosed with COVID-19 in the last 2 weeks.   . Tell the health care staff about your symptoms: fever, cough and shortness of breath. . After you have been seen by a medical provider, you will be either: o Tested for (COVID-19) and discharged home on quarantine except to seek medical care if symptoms worsen, and asked to  - Stay home and avoid contact with others until you get your results (4-5 days)  - Avoid travel on public transportation if possible (such as bus, train, or airplane) or o Sent to the Emergency Department by EMS for evaluation, COVID-19 testing, and possible admission depending on your condition and test results.  What to do if you are LOW RISK for COVID-19?  Reduce your risk of any infection by using the same precautions used for avoiding the common cold or flu:  [name] Wash your hands often with soap and warm water for at least 20 seconds.  If soap and water are not readily available, use an alcohol-based hand sanitizer with at least 60% alcohol.  [name]  If coughing or sneezing, cover your mouth and nose by coughing or sneezing into the elbow areas of your shirt or coat, into a tissue or into your sleeve (not your hands). . Avoid shaking hands with others and consider head nods or verbal greetings only. . Avoid touching your eyes, nose, or mouth with unwashed hands.  . Avoid close contact with people who are sick. . Avoid places or events with large numbers of people in one location, like concerts or sporting events. . Carefully consider travel plans you have or are making. . If you are planning any travel outside or inside the [place], visit the CDC's  Travelers' Health webpage for the latest health notices. . If you have some symptoms but not all symptoms, continue to monitor at home and seek medical attention if your symptoms worsen. . If you are having a medical emergency, call 911.   [place] / e-Visit: [email or web address]         [facility]: [facility]: [phone]                   [facility]: [phone]

## 2018-09-14 ENCOUNTER — Telehealth: Payer: Self-pay | Admitting: Hematology

## 2018-09-14 DIAGNOSIS — M1990 Unspecified osteoarthritis, unspecified site: Secondary | ICD-10-CM | POA: Diagnosis not present

## 2018-09-14 NOTE — Telephone Encounter (Signed)
Scheduled appt per 5/29 los ° °Left a voice message of appt date and time. °

## 2018-09-15 DIAGNOSIS — M1990 Unspecified osteoarthritis, unspecified site: Secondary | ICD-10-CM | POA: Diagnosis not present

## 2018-09-16 DIAGNOSIS — M1990 Unspecified osteoarthritis, unspecified site: Secondary | ICD-10-CM | POA: Diagnosis not present

## 2018-09-17 DIAGNOSIS — M1990 Unspecified osteoarthritis, unspecified site: Secondary | ICD-10-CM | POA: Diagnosis not present

## 2018-09-18 ENCOUNTER — Inpatient Hospital Stay: Payer: Medicaid Other | Attending: Hematology

## 2018-09-18 ENCOUNTER — Inpatient Hospital Stay: Payer: Medicaid Other

## 2018-09-18 ENCOUNTER — Other Ambulatory Visit: Payer: Self-pay

## 2018-09-18 DIAGNOSIS — I780 Hereditary hemorrhagic telangiectasia: Secondary | ICD-10-CM

## 2018-09-18 DIAGNOSIS — Z79899 Other long term (current) drug therapy: Secondary | ICD-10-CM | POA: Insufficient documentation

## 2018-09-18 DIAGNOSIS — Z5112 Encounter for antineoplastic immunotherapy: Secondary | ICD-10-CM | POA: Diagnosis present

## 2018-09-18 DIAGNOSIS — M1990 Unspecified osteoarthritis, unspecified site: Secondary | ICD-10-CM | POA: Diagnosis not present

## 2018-09-18 DIAGNOSIS — Z95828 Presence of other vascular implants and grafts: Secondary | ICD-10-CM | POA: Insufficient documentation

## 2018-09-18 DIAGNOSIS — D500 Iron deficiency anemia secondary to blood loss (chronic): Secondary | ICD-10-CM

## 2018-09-18 LAB — RETICULOCYTES
Immature Retic Fract: 34.6 % — ABNORMAL HIGH (ref 2.3–15.9)
RBC.: 3.5 MIL/uL — ABNORMAL LOW (ref 3.87–5.11)
Retic Count, Absolute: 93.5 10*3/uL (ref 19.0–186.0)
Retic Ct Pct: 2.7 % (ref 0.4–3.1)

## 2018-09-18 LAB — CBC WITH DIFFERENTIAL (CANCER CENTER ONLY)
Abs Immature Granulocytes: 0.03 10*3/uL (ref 0.00–0.07)
Basophils Absolute: 0.1 10*3/uL (ref 0.0–0.1)
Basophils Relative: 1 %
Eosinophils Absolute: 0.1 10*3/uL (ref 0.0–0.5)
Eosinophils Relative: 1 %
HCT: 29.5 % — ABNORMAL LOW (ref 36.0–46.0)
Hemoglobin: 8.1 g/dL — ABNORMAL LOW (ref 12.0–15.0)
Immature Granulocytes: 0 %
Lymphocytes Relative: 12 %
Lymphs Abs: 1.3 10*3/uL (ref 0.7–4.0)
MCH: 23.5 pg — ABNORMAL LOW (ref 26.0–34.0)
MCHC: 27.5 g/dL — ABNORMAL LOW (ref 30.0–36.0)
MCV: 85.5 fL (ref 80.0–100.0)
Monocytes Absolute: 0.6 10*3/uL (ref 0.1–1.0)
Monocytes Relative: 6 %
Neutro Abs: 8.2 10*3/uL — ABNORMAL HIGH (ref 1.7–7.7)
Neutrophils Relative %: 80 %
Platelet Count: 295 10*3/uL (ref 150–400)
RBC: 3.45 MIL/uL — ABNORMAL LOW (ref 3.87–5.11)
RDW: 23.5 % — ABNORMAL HIGH (ref 11.5–15.5)
WBC Count: 10.3 10*3/uL (ref 4.0–10.5)
nRBC: 0 % (ref 0.0–0.2)

## 2018-09-18 LAB — SAMPLE TO BLOOD BANK

## 2018-09-18 MED ORDER — HEPARIN SOD (PORK) LOCK FLUSH 100 UNIT/ML IV SOLN
500.0000 [IU] | Freq: Once | INTRAVENOUS | Status: AC | PRN
  Administered 2018-09-18: 500 [IU]
  Filled 2018-09-18: qty 5

## 2018-09-18 MED ORDER — SODIUM CHLORIDE 0.9% FLUSH
10.0000 mL | Freq: Once | INTRAVENOUS | Status: AC
  Administered 2018-09-18: 12:00:00 10 mL
  Filled 2018-09-18: qty 10

## 2018-09-21 ENCOUNTER — Other Ambulatory Visit: Payer: Self-pay | Admitting: Internal Medicine

## 2018-09-21 ENCOUNTER — Telehealth: Payer: Self-pay | Admitting: *Deleted

## 2018-09-21 DIAGNOSIS — M1990 Unspecified osteoarthritis, unspecified site: Secondary | ICD-10-CM | POA: Diagnosis not present

## 2018-09-21 DIAGNOSIS — Z1231 Encounter for screening mammogram for malignant neoplasm of breast: Secondary | ICD-10-CM

## 2018-09-21 NOTE — Telephone Encounter (Signed)
[facility] calls for attending name for rt mammogram, dr Software engineer attending given, also ask them to ask pt to make a clinic appt asap w/ dr Sherry Ruffing

## 2018-09-22 DIAGNOSIS — M1990 Unspecified osteoarthritis, unspecified site: Secondary | ICD-10-CM | POA: Diagnosis not present

## 2018-09-23 DIAGNOSIS — M1990 Unspecified osteoarthritis, unspecified site: Secondary | ICD-10-CM | POA: Diagnosis not present

## 2018-09-24 DIAGNOSIS — M1990 Unspecified osteoarthritis, unspecified site: Secondary | ICD-10-CM | POA: Diagnosis not present

## 2018-09-25 ENCOUNTER — Inpatient Hospital Stay: Payer: Medicaid Other

## 2018-09-25 ENCOUNTER — Other Ambulatory Visit: Payer: Self-pay

## 2018-09-25 ENCOUNTER — Telehealth: Payer: Self-pay | Admitting: *Deleted

## 2018-09-25 DIAGNOSIS — M1990 Unspecified osteoarthritis, unspecified site: Secondary | ICD-10-CM | POA: Diagnosis not present

## 2018-09-25 NOTE — Telephone Encounter (Signed)
OK, I will send a reschedule message  Truitt Merle MD

## 2018-09-25 NOTE — Telephone Encounter (Signed)
TCT patient as she did not show up for her appts today, including chemo/iron. Spoke with patient. She sates she woke up late. She states she called and spoke to someone about not coming today. Pt wants to re-schedule  Dr. Burr Medico notified.

## 2018-09-28 ENCOUNTER — Telehealth: Payer: Self-pay | Admitting: Hematology

## 2018-09-28 DIAGNOSIS — M1990 Unspecified osteoarthritis, unspecified site: Secondary | ICD-10-CM | POA: Diagnosis not present

## 2018-09-28 NOTE — Telephone Encounter (Signed)
R/s appt per 6/12 sch message - unable to reach pt . Left message for pt with appt date and time

## 2018-09-29 DIAGNOSIS — M1990 Unspecified osteoarthritis, unspecified site: Secondary | ICD-10-CM | POA: Diagnosis not present

## 2018-09-30 ENCOUNTER — Inpatient Hospital Stay: Payer: Medicaid Other

## 2018-09-30 DIAGNOSIS — M1990 Unspecified osteoarthritis, unspecified site: Secondary | ICD-10-CM | POA: Diagnosis not present

## 2018-10-01 ENCOUNTER — Other Ambulatory Visit: Payer: Self-pay

## 2018-10-01 ENCOUNTER — Telehealth: Payer: Self-pay

## 2018-10-01 ENCOUNTER — Telehealth: Payer: Self-pay | Admitting: Hematology

## 2018-10-01 DIAGNOSIS — M1990 Unspecified osteoarthritis, unspecified site: Secondary | ICD-10-CM | POA: Diagnosis not present

## 2018-10-01 DIAGNOSIS — D500 Iron deficiency anemia secondary to blood loss (chronic): Secondary | ICD-10-CM

## 2018-10-01 NOTE — Telephone Encounter (Signed)
Patient called after missing her recent appointments, feels run down, had one black stool, scheduled her for lab appointment tomorrow 6/19 at 10:45 (her request).  Ordered sample to blood bank.

## 2018-10-01 NOTE — Telephone Encounter (Signed)
Returned call re rescheduling appointments. Patient has had several requests to reschedule. Next appointment is 7/1. Confirmed with patient. Per patient she feels a little run down and feels she needs lab check. Patient transferred to desk nurse.

## 2018-10-02 ENCOUNTER — Other Ambulatory Visit: Payer: Self-pay

## 2018-10-02 ENCOUNTER — Telehealth: Payer: Self-pay

## 2018-10-02 ENCOUNTER — Inpatient Hospital Stay: Payer: Medicaid Other

## 2018-10-02 VITALS — BP 139/61 | HR 63 | Temp 99.0°F | Resp 16 | Ht 65.0 in | Wt 269.8 lb

## 2018-10-02 DIAGNOSIS — D500 Iron deficiency anemia secondary to blood loss (chronic): Secondary | ICD-10-CM

## 2018-10-02 DIAGNOSIS — Z95828 Presence of other vascular implants and grafts: Secondary | ICD-10-CM

## 2018-10-02 DIAGNOSIS — M1990 Unspecified osteoarthritis, unspecified site: Secondary | ICD-10-CM | POA: Diagnosis not present

## 2018-10-02 DIAGNOSIS — I780 Hereditary hemorrhagic telangiectasia: Secondary | ICD-10-CM | POA: Diagnosis not present

## 2018-10-02 LAB — CBC WITH DIFFERENTIAL/PLATELET
Abs Immature Granulocytes: 0.05 10*3/uL (ref 0.00–0.07)
Basophils Absolute: 0 10*3/uL (ref 0.0–0.1)
Basophils Relative: 0 %
Eosinophils Absolute: 0.1 10*3/uL (ref 0.0–0.5)
Eosinophils Relative: 1 %
HCT: 27.2 % — ABNORMAL LOW (ref 36.0–46.0)
Hemoglobin: 7.4 g/dL — ABNORMAL LOW (ref 12.0–15.0)
Immature Granulocytes: 0 %
Lymphocytes Relative: 13 %
Lymphs Abs: 1.7 10*3/uL (ref 0.7–4.0)
MCH: 22.9 pg — ABNORMAL LOW (ref 26.0–34.0)
MCHC: 27.2 g/dL — ABNORMAL LOW (ref 30.0–36.0)
MCV: 84.2 fL (ref 80.0–100.0)
Monocytes Absolute: 0.6 10*3/uL (ref 0.1–1.0)
Monocytes Relative: 5 %
Neutro Abs: 10.6 10*3/uL — ABNORMAL HIGH (ref 1.7–7.7)
Neutrophils Relative %: 81 %
Platelets: 233 10*3/uL (ref 150–400)
RBC: 3.23 MIL/uL — ABNORMAL LOW (ref 3.87–5.11)
RDW: 22.6 % — ABNORMAL HIGH (ref 11.5–15.5)
WBC: 13.1 10*3/uL — ABNORMAL HIGH (ref 4.0–10.5)
nRBC: 0 % (ref 0.0–0.2)

## 2018-10-02 LAB — SAMPLE TO BLOOD BANK

## 2018-10-02 LAB — RETICULOCYTES
Immature Retic Fract: 37.7 % — ABNORMAL HIGH (ref 2.3–15.9)
RBC.: 3.2 MIL/uL — ABNORMAL LOW (ref 3.87–5.11)
Retic Count, Absolute: 81.3 10*3/uL (ref 19.0–186.0)
Retic Ct Pct: 2.5 % (ref 0.4–3.1)

## 2018-10-02 LAB — PREPARE RBC (CROSSMATCH)

## 2018-10-02 MED ORDER — HEPARIN SOD (PORK) LOCK FLUSH 100 UNIT/ML IV SOLN
500.0000 [IU] | Freq: Once | INTRAVENOUS | Status: AC | PRN
  Administered 2018-10-02: 16:00:00 500 [IU]
  Filled 2018-10-02: qty 5

## 2018-10-02 MED ORDER — SODIUM CHLORIDE 0.9 % IV SOLN
125.0000 mg | Freq: Once | INTRAVENOUS | Status: AC
  Administered 2018-10-02: 125 mg via INTRAVENOUS
  Filled 2018-10-02: qty 10

## 2018-10-02 MED ORDER — SODIUM CHLORIDE 0.9 % IV SOLN
5.3000 mg/kg | Freq: Once | INTRAVENOUS | Status: AC
  Administered 2018-10-02: 600 mg via INTRAVENOUS
  Filled 2018-10-02: qty 16

## 2018-10-02 MED ORDER — SODIUM CHLORIDE 0.9 % IV SOLN
Freq: Once | INTRAVENOUS | Status: AC
  Administered 2018-10-02: 13:00:00 via INTRAVENOUS
  Filled 2018-10-02: qty 250

## 2018-10-02 MED ORDER — SODIUM CHLORIDE 0.9% FLUSH
10.0000 mL | INTRAVENOUS | Status: DC | PRN
  Administered 2018-10-02: 10 mL
  Filled 2018-10-02: qty 10

## 2018-10-02 NOTE — Telephone Encounter (Signed)
Spoke with patient in lobby of [facility] with Hemoglobin results 7.4, patient is symptomatic fatigue, weakness, SOB, explained that today will given iron and Avastin and bring her back on Saturday morning at 8:30 for one unit of blood, orders placed.

## 2018-10-02 NOTE — Patient Instructions (Signed)
[facility] Discharge Instructions for Patients Receiving Chemotherapy  Today you received the following chemotherapy agents :  Avastin.  To help prevent nausea and vomiting after your treatment, we encourage you to take your nausea medication as prescribed,   If you develop nausea and vomiting that is not controlled by your nausea medication, call the clinic.   BELOW ARE SYMPTOMS THAT SHOULD BE REPORTED IMMEDIATELY:  *FEVER GREATER THAN 100.5 F  *CHILLS WITH OR WITHOUT FEVER  NAUSEA AND VOMITING THAT IS NOT CONTROLLED WITH YOUR NAUSEA MEDICATION  *UNUSUAL SHORTNESS OF BREATH  *UNUSUAL BRUISING OR BLEEDING  TENDERNESS IN MOUTH AND THROAT WITH OR WITHOUT PRESENCE OF ULCERS  *URINARY PROBLEMS  *BOWEL PROBLEMS  UNUSUAL RASH Items with * indicate a potential emergency and should be followed up as soon as possible.  Feel free to call the clinic should you have any questions or concerns. The clinic phone number is [phone].  Please show the [place] at check-in to the Emergency Department and triage nurse.    Sodium Ferric Gluconate Complex injection What is this medicine? SODIUM FERRIC GLUCONATE COMPLEX (SOE dee um FER ik GLOO koe nate KOM pleks) is an iron replacement. It is used with epoetin therapy to treat low iron levels in patients who are receiving hemodialysis. This medicine may be used for other purposes; ask your health care provider or pharmacist if you have questions. COMMON BRAND NAME(S): Ferrlecit, Nulecit What should I tell my health care provider before I take this medicine? They need to know if you have any of the following conditions: -anemia that is not from iron deficiency -high levels of iron in the body -an unusual or allergic reaction to iron, benzyl alcohol, other medicines, foods, dyes, or preservatives -pregnant or are trying to become pregnant -breast-feeding How should I use this medicine? This medicine is for  infusion into a vein. It is given by a health care professional in a hospital or clinic setting. Talk to your pediatrician regarding the use of this medicine in children. While this drug may be prescribed for children as young as 24 years old for selected conditions, precautions do apply. Overdosage: If you think you have taken too much of this medicine contact a poison control center or emergency room at once. NOTE: This medicine is only for you. Do not share this medicine with others. What if I miss a dose? It is important not to miss your dose. Call your doctor or health care professional if you are unable to keep an appointment. What may interact with this medicine? Do not take this medicine with any of the following medications: -deferoxamine -dimercaprol -other iron products This medicine may also interact with the following medications: -chloramphenicol -deferasirox -medicine for blood pressure like enalapril This list may not describe all possible interactions. Give your health care provider a list of all the medicines, herbs, non-prescription drugs, or dietary supplements you use. Also tell them if you smoke, drink alcohol, or use illegal drugs. Some items may interact with your medicine. What should I watch for while using this medicine? Your condition will be monitored carefully while you are receiving this medicine. Visit your doctor for check-ups as directed. What side effects may I notice from receiving this medicine? Side effects that you should report to your doctor or health care professional as soon as possible: -allergic reactions like skin rash, itching or hives, swelling of the face, lips, or tongue -breathing problems -changes in hearing -changes in vision -  chills, flushing, or sweating -fast, irregular heartbeat -feeling faint or lightheaded, falls -fever, flu-like symptoms -high or low blood pressure -pain, tingling, numbness in the hands or feet -severe pain in  the chest, back, flanks, or groin -swelling of the ankles, feet, hands -trouble passing urine or change in the amount of urine -unusually weak or tired Side effects that usually do not require medical attention (report to your doctor or health care professional if they continue or are bothersome): -cramps -dark colored stools -diarrhea -headache -nausea, vomiting -stomach upset This list may not describe all possible side effects. Call your doctor for medical advice about side effects. You may report side effects to FDA at 1-800-FDA-1088. Where should I keep my medicine? This drug is given in a hospital or clinic and will not be stored at home. NOTE: This sheet is a summary. It may not cover all possible information. If you have questions about this medicine, talk to your doctor, pharmacist, or health care provider.  2019 Elsevier/Gold Standard (2007-12-02 15:58:57)

## 2018-10-02 NOTE — Progress Notes (Signed)
Per Dr. Burr Medico, [place] to treat today with care plan dated for 09/25/18 - Pt was NO SHOW on 09/25/18.  OK with MD with no urine protein test today - as ok per Theadora Rama, pharmacist ( not needed ).

## 2018-10-03 ENCOUNTER — Other Ambulatory Visit: Payer: Self-pay

## 2018-10-03 ENCOUNTER — Inpatient Hospital Stay: Payer: Medicaid Other

## 2018-10-03 VITALS — BP 143/89 | HR 67 | Temp 98.4°F | Resp 16

## 2018-10-03 DIAGNOSIS — D500 Iron deficiency anemia secondary to blood loss (chronic): Secondary | ICD-10-CM

## 2018-10-03 DIAGNOSIS — Z95828 Presence of other vascular implants and grafts: Secondary | ICD-10-CM

## 2018-10-03 DIAGNOSIS — I780 Hereditary hemorrhagic telangiectasia: Secondary | ICD-10-CM | POA: Diagnosis not present

## 2018-10-03 MED ORDER — ACETAMINOPHEN 325 MG PO TABS
650.0000 mg | ORAL_TABLET | Freq: Once | ORAL | Status: AC
  Administered 2018-10-03: 650 mg via ORAL

## 2018-10-03 MED ORDER — ACETAMINOPHEN 325 MG PO TABS
ORAL_TABLET | ORAL | Status: AC
  Filled 2018-10-03: qty 2

## 2018-10-03 MED ORDER — DIPHENHYDRAMINE HCL 25 MG PO CAPS
25.0000 mg | ORAL_CAPSULE | Freq: Once | ORAL | Status: AC
  Administered 2018-10-03: 25 mg via ORAL

## 2018-10-03 MED ORDER — SODIUM CHLORIDE 0.9% FLUSH
3.0000 mL | INTRAVENOUS | Status: DC | PRN
  Filled 2018-10-03: qty 10

## 2018-10-03 MED ORDER — HEPARIN SOD (PORK) LOCK FLUSH 100 UNIT/ML IV SOLN
250.0000 [IU] | INTRAVENOUS | Status: DC | PRN
  Filled 2018-10-03: qty 5

## 2018-10-03 MED ORDER — DIPHENHYDRAMINE HCL 25 MG PO CAPS
ORAL_CAPSULE | ORAL | Status: AC
  Filled 2018-10-03: qty 1

## 2018-10-03 MED ORDER — SODIUM CHLORIDE 0.9% IV SOLUTION
250.0000 mL | Freq: Once | INTRAVENOUS | Status: AC
  Administered 2018-10-03: 250 mL via INTRAVENOUS
  Filled 2018-10-03: qty 250

## 2018-10-03 MED ORDER — SODIUM CHLORIDE 0.9% FLUSH
10.0000 mL | Freq: Once | INTRAVENOUS | Status: AC
  Administered 2018-10-03: 10 mL
  Filled 2018-10-03: qty 10

## 2018-10-03 MED ORDER — HEPARIN SOD (PORK) LOCK FLUSH 100 UNIT/ML IV SOLN
500.0000 [IU] | Freq: Once | INTRAVENOUS | Status: AC
  Administered 2018-10-03: 500 [IU]
  Filled 2018-10-03: qty 5

## 2018-10-03 NOTE — Patient Instructions (Signed)
Blood Transfusion, Adult, Care After This sheet gives you information about how to care for yourself after your procedure. Your doctor may also give you more specific instructions. If you have problems or questions, contact your doctor. Follow these instructions at home:   Take over-the-counter and prescription medicines only as told by your doctor.  Go back to your normal activities as told by your doctor.  Follow instructions from your doctor about how to take care of the area where an IV tube was put into your vein (insertion site). Make sure you: ? Wash your hands with soap and water before you change your bandage (dressing). If there is no soap and water, use hand sanitizer. ? Change your bandage as told by your doctor.  Check your IV insertion site every day for signs of infection. Check for: ? More redness, swelling, or pain. ? More fluid or blood. ? Warmth. ? Pus or a bad smell. Contact a doctor if:  You have more redness, swelling, or pain around the IV insertion site.  You have more fluid or blood coming from the IV insertion site.  Your IV insertion site feels warm to the touch.  You have pus or a bad smell coming from the IV insertion site.  Your pee (urine) turns pink, red, or brown.  You feel weak after doing your normal activities. Get help right away if:  You have signs of a serious allergic or body defense (immune) system reaction, including: ? Itchiness. ? Hives. ? Trouble breathing. ? Anxiety. ? Pain in your chest or lower back. ? Fever, flushing, and chills. ? Fast pulse. ? Rash. ? Watery poop (diarrhea). ? Throwing up (vomiting). ? Dark pee. ? Serious headache. ? Dizziness. ? Stiff neck. ? Yellow color in your face or the white parts of your eyes (jaundice). Summary  After a blood transfusion, return to your normal activities as told by your doctor.  Every day, check for signs of infection where the IV tube was put into your vein.  Some  signs of infection are warm skin, more redness and pain, more fluid or blood, and pus or a bad smell where the needle went in.  Contact your doctor if you feel weak or have any unusual symptoms. This information is not intended to replace advice given to you by your health care provider. Make sure you discuss any questions you have with your health care provider. Document Released: 04/22/2014 Document Revised: 11/24/2015 Document Reviewed: 11/24/2015 Elsevier Interactive Patient Education  2019 [employer]  

## 2018-10-05 DIAGNOSIS — M1990 Unspecified osteoarthritis, unspecified site: Secondary | ICD-10-CM | POA: Diagnosis not present

## 2018-10-05 LAB — BPAM RBC
Blood Product Expiration Date: 202007162359
ISSUE DATE / TIME: 202006200924
Unit Type and Rh: 600

## 2018-10-05 LAB — TYPE AND SCREEN
ABO/RH(D): A NEG
Antibody Screen: NEGATIVE
Unit division: 0

## 2018-10-06 DIAGNOSIS — M1990 Unspecified osteoarthritis, unspecified site: Secondary | ICD-10-CM | POA: Diagnosis not present

## 2018-10-07 DIAGNOSIS — M1990 Unspecified osteoarthritis, unspecified site: Secondary | ICD-10-CM | POA: Diagnosis not present

## 2018-10-08 DIAGNOSIS — M1990 Unspecified osteoarthritis, unspecified site: Secondary | ICD-10-CM | POA: Diagnosis not present

## 2018-10-09 ENCOUNTER — Other Ambulatory Visit: Payer: Medicaid Other

## 2018-10-09 ENCOUNTER — Ambulatory Visit: Payer: Medicaid Other

## 2018-10-09 ENCOUNTER — Ambulatory Visit: Payer: Medicaid Other | Admitting: Hematology

## 2018-10-09 DIAGNOSIS — M1990 Unspecified osteoarthritis, unspecified site: Secondary | ICD-10-CM | POA: Diagnosis not present

## 2018-10-12 ENCOUNTER — Other Ambulatory Visit: Payer: Self-pay | Admitting: Hematology

## 2018-10-12 DIAGNOSIS — M1990 Unspecified osteoarthritis, unspecified site: Secondary | ICD-10-CM | POA: Diagnosis not present

## 2018-10-13 DIAGNOSIS — M1990 Unspecified osteoarthritis, unspecified site: Secondary | ICD-10-CM | POA: Diagnosis not present

## 2018-10-14 ENCOUNTER — Inpatient Hospital Stay: Payer: Medicaid Other

## 2018-10-14 ENCOUNTER — Inpatient Hospital Stay (HOSPITAL_BASED_OUTPATIENT_CLINIC_OR_DEPARTMENT_OTHER): Payer: Medicaid Other | Admitting: Nurse Practitioner

## 2018-10-14 ENCOUNTER — Encounter: Payer: Self-pay | Admitting: Nurse Practitioner

## 2018-10-14 ENCOUNTER — Inpatient Hospital Stay: Payer: Medicaid Other | Attending: Hematology

## 2018-10-14 ENCOUNTER — Other Ambulatory Visit: Payer: Self-pay

## 2018-10-14 ENCOUNTER — Telehealth: Payer: Self-pay | Admitting: *Deleted

## 2018-10-14 VITALS — BP 132/70 | HR 80 | Temp 98.2°F | Resp 18 | Ht 65.0 in | Wt 272.6 lb

## 2018-10-14 VITALS — BP 112/71 | HR 68 | Temp 97.8°F | Resp 18

## 2018-10-14 DIAGNOSIS — E669 Obesity, unspecified: Secondary | ICD-10-CM | POA: Diagnosis not present

## 2018-10-14 DIAGNOSIS — D500 Iron deficiency anemia secondary to blood loss (chronic): Secondary | ICD-10-CM | POA: Diagnosis not present

## 2018-10-14 DIAGNOSIS — K922 Gastrointestinal hemorrhage, unspecified: Secondary | ICD-10-CM | POA: Diagnosis not present

## 2018-10-14 DIAGNOSIS — I780 Hereditary hemorrhagic telangiectasia: Secondary | ICD-10-CM | POA: Diagnosis not present

## 2018-10-14 DIAGNOSIS — Z95828 Presence of other vascular implants and grafts: Secondary | ICD-10-CM

## 2018-10-14 DIAGNOSIS — I10 Essential (primary) hypertension: Secondary | ICD-10-CM | POA: Insufficient documentation

## 2018-10-14 DIAGNOSIS — M1990 Unspecified osteoarthritis, unspecified site: Secondary | ICD-10-CM | POA: Diagnosis not present

## 2018-10-14 LAB — CBC WITH DIFFERENTIAL (CANCER CENTER ONLY)
Abs Immature Granulocytes: 0.05 10*3/uL (ref 0.00–0.07)
Basophils Absolute: 0 10*3/uL (ref 0.0–0.1)
Basophils Relative: 0 %
Eosinophils Absolute: 0.1 10*3/uL (ref 0.0–0.5)
Eosinophils Relative: 1 %
HCT: 24.3 % — ABNORMAL LOW (ref 36.0–46.0)
Hemoglobin: 6.8 g/dL — CL (ref 12.0–15.0)
Immature Granulocytes: 0 %
Lymphocytes Relative: 11 %
Lymphs Abs: 1.3 10*3/uL (ref 0.7–4.0)
MCH: 23.8 pg — ABNORMAL LOW (ref 26.0–34.0)
MCHC: 28 g/dL — ABNORMAL LOW (ref 30.0–36.0)
MCV: 85 fL (ref 80.0–100.0)
Monocytes Absolute: 0.5 10*3/uL (ref 0.1–1.0)
Monocytes Relative: 4 %
Neutro Abs: 10.2 10*3/uL — ABNORMAL HIGH (ref 1.7–7.7)
Neutrophils Relative %: 84 %
Platelet Count: 310 10*3/uL (ref 150–400)
RBC: 2.86 MIL/uL — ABNORMAL LOW (ref 3.87–5.11)
RDW: 22.4 % — ABNORMAL HIGH (ref 11.5–15.5)
WBC Count: 12.3 10*3/uL — ABNORMAL HIGH (ref 4.0–10.5)
nRBC: 0 % (ref 0.0–0.2)

## 2018-10-14 LAB — SAMPLE TO BLOOD BANK

## 2018-10-14 LAB — IRON AND TIBC
Iron: 19 ug/dL — ABNORMAL LOW (ref 41–142)
Saturation Ratios: 6 % — ABNORMAL LOW (ref 21–57)
TIBC: 306 ug/dL (ref 236–444)
UIBC: 287 ug/dL (ref 120–384)

## 2018-10-14 LAB — RETICULOCYTES
Immature Retic Fract: 30.4 % — ABNORMAL HIGH (ref 2.3–15.9)
RBC.: 2.87 MIL/uL — ABNORMAL LOW (ref 3.87–5.11)
Retic Count, Absolute: 95 10*3/uL (ref 19.0–186.0)
Retic Ct Pct: 3.3 % — ABNORMAL HIGH (ref 0.4–3.1)

## 2018-10-14 LAB — FERRITIN: Ferritin: 31 ng/mL (ref 11–307)

## 2018-10-14 LAB — CMP (CANCER CENTER ONLY)
ALT: 6 U/L (ref 0–44)
AST: 11 U/L — ABNORMAL LOW (ref 15–41)
Albumin: 3.3 g/dL — ABNORMAL LOW (ref 3.5–5.0)
Alkaline Phosphatase: 59 U/L (ref 38–126)
Anion gap: 8 (ref 5–15)
BUN: 11 mg/dL (ref 6–20)
CO2: 23 mmol/L (ref 22–32)
Calcium: 8.2 mg/dL — ABNORMAL LOW (ref 8.9–10.3)
Chloride: 109 mmol/L (ref 98–111)
Creatinine: 0.97 mg/dL (ref 0.44–1.00)
GFR, Est AFR Am: >60 mL/min (ref >60)
GFR, Estimated: >60 mL/min (ref >60)
Glucose, Bld: 133 mg/dL — ABNORMAL HIGH (ref 70–99)
Potassium: 3.3 mmol/L — ABNORMAL LOW (ref 3.5–5.1)
Sodium: 140 mmol/L (ref 135–145)
Total Bilirubin: <0.2 mg/dL — ABNORMAL LOW (ref 0.3–1.2)
Total Protein: 6.8 g/dL (ref 6.5–8.1)

## 2018-10-14 LAB — TOTAL PROTEIN, URINE DIPSTICK: Protein, ur: <30 mg/dL — AB

## 2018-10-14 MED ORDER — SODIUM CHLORIDE 0.9% FLUSH
10.0000 mL | Freq: Once | INTRAVENOUS | Status: AC
  Administered 2018-10-14: 10 mL
  Filled 2018-10-14: qty 10

## 2018-10-14 MED ORDER — SODIUM CHLORIDE 0.9 % IV SOLN
Freq: Once | INTRAVENOUS | Status: AC
  Administered 2018-10-14: 14:00:00 via INTRAVENOUS
  Filled 2018-10-14: qty 250

## 2018-10-14 MED ORDER — SODIUM CHLORIDE 0.9 % IV SOLN
5.3000 mg/kg | Freq: Once | INTRAVENOUS | Status: AC
  Administered 2018-10-14: 600 mg via INTRAVENOUS
  Filled 2018-10-14: qty 16

## 2018-10-14 MED ORDER — SODIUM CHLORIDE 0.9% FLUSH
10.0000 mL | INTRAVENOUS | Status: DC | PRN
  Administered 2018-10-14: 10 mL
  Filled 2018-10-14: qty 10

## 2018-10-14 MED ORDER — PROCHLORPERAZINE MALEATE 10 MG PO TABS: 10.0000 mg | ORAL_TABLET | Freq: Four times a day (QID) | ORAL | 1 refills | Status: DC | PRN

## 2018-10-14 MED ORDER — TAMOXIFEN CITRATE 20 MG PO TABS: 20.0000 mg | ORAL_TABLET | Freq: Every day | ORAL | 3 refills | Status: DC

## 2018-10-14 MED ORDER — HEPARIN SOD (PORK) LOCK FLUSH 100 UNIT/ML IV SOLN
500.0000 [IU] | Freq: Once | INTRAVENOUS | Status: AC | PRN
  Administered 2018-10-14: 500 [IU]
  Filled 2018-10-14: qty 5

## 2018-10-14 MED ORDER — PANTOPRAZOLE SODIUM 40 MG PO TBEC: 40.0000 mg | DELAYED_RELEASE_TABLET | Freq: Every day | ORAL | 3 refills | Status: DC

## 2018-10-14 MED ORDER — SODIUM CHLORIDE 0.9 % IV SOLN
125.0000 mg | Freq: Once | INTRAVENOUS | Status: AC
  Administered 2018-10-14: 15:00:00 125 mg via INTRAVENOUS
  Filled 2018-10-14: qty 10

## 2018-10-14 NOTE — Telephone Encounter (Signed)
Received call report from [name] MT.  "Today's Hgb = 6.8."  Routing call encounter and message sent with results.  S/P provider F/U earlier today.

## 2018-10-14 NOTE — Progress Notes (Signed)
OK to treat with abnormal labs per Lacie.

## 2018-10-14 NOTE — Progress Notes (Signed)
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 She was last seen by Dr. Burr Medico 09/11/18. She completed avastin and IV ferric gluconate on 10/02/18. She was given RBC transfusion 10/06/18. She is doing OK. Fatigue level at baseline. 3 days ago she developed black "runny" stools, once per day. No overt blood per rectum. Epistaxis improved lately, last episode 4 days ago lasting 15-20 minutes; this is mild for her. Denies emesis. Denies abdominal pain. She has a cough she attributes to allergies. She is occasionally dizzy. Denies chest pain, dyspnea. Denies fever or chills. Denies leg swelling. She has f/u at [facility] later this month. She is out of multiple medications. Mood is low lately.     MEDICAL HISTORY:  Past Medical History:  Diagnosis Date   Anxiety    Arthritis    knnes,back   GERD (gastroesophageal reflux disease)    Hereditary hemorrhagic telangiectasia (HCC)    History of swelling of feet    Hyperlipidemia    Hypertension    Major depressive disorder, recurrent episode ([place])  06/05/2015   Obesity    Snores     SURGICAL HISTORY: Past Surgical History:  Procedure Laterality Date   ABDOMINAL HYSTERECTOMY     CARPAL TUNNEL RELEASE  05/13/2011   Procedure: CARPAL TUNNEL RELEASE;  Surgeon: Nita Sells, MD;  Location: [facility];  Service: Orthopedics;  Laterality: Left;   COLONOSCOPY WITH PROPOFOL N/A 04/28/2014   Procedure: COLONOSCOPY WITH PROPOFOL;  Surgeon: Cleotis Nipper, MD;  Location: [facility] ENDOSCOPY;  Service: Endoscopy;  Laterality: N/A;   DG TOES*L*  2/10   rt   DILATION AND CURETTAGE OF UTERUS     ENTEROSCOPY N/A 10/17/2017   Procedure: ENTEROSCOPY;  Surgeon: Otis Brace, MD;  Location: [facility];  Service: Gastroenterology;  Laterality: N/A;   ESOPHAGOGASTRODUODENOSCOPY N/A 04/10/2014   Procedure: ESOPHAGOGASTRODUODENOSCOPY (EGD);  Surgeon: [name], MD;  Location: [facility] ENDOSCOPY;  Service: Endoscopy;  Laterality: N/A;   ESOPHAGOGASTRODUODENOSCOPY N/A 05/10/2017   Procedure: ESOPHAGOGASTRODUODENOSCOPY (EGD);  Surgeon: Ronald Lobo, MD;  Location: [facility] ENDOSCOPY;  Service: Endoscopy;  Laterality: N/A;   ESOPHAGOGASTRODUODENOSCOPY N/A 09/22/2017   Procedure: ESOPHAGOGASTRODUODENOSCOPY (EGD);  Surgeon: Clarene Essex, MD;  Location: [facility];  Service: Endoscopy;  Laterality: N/A;  bedside   ESOPHAGOGASTRODUODENOSCOPY (EGD) WITH PROPOFOL N/A 04/27/2014   Procedure: ESOPHAGOGASTRODUODENOSCOPY (EGD) WITH PROPOFOL;  Surgeon: Cleotis Nipper, MD;  Location: [place];  Service: Endoscopy;  Laterality: N/A;  possible apc   ESOPHAGOGASTRODUODENOSCOPY (EGD) WITH PROPOFOL N/A 09/30/2017   Procedure: ESOPHAGOGASTRODUODENOSCOPY (EGD) WITH PROPOFOL;  Surgeon: [name], MD;  Location: [facility];  Service: Gastroenterology;  Laterality: N/A;   ESOPHAGOGASTRODUODENOSCOPY (EGD) WITH PROPOFOL N/A 10/01/2017  Procedure: ESOPHAGOGASTRODUODENOSCOPY (EGD) WITH PROPOFOL;  Surgeon: [name], MD;  Location: [facility];  Service: Gastroenterology;  Laterality: N/A;   ESOPHAGOGASTRODUODENOSCOPY (EGD) WITH PROPOFOL N/A 10/08/2017   Procedure: ESOPHAGOGASTRODUODENOSCOPY (EGD) WITH PROPOFOL;  Surgeon: Otis Brace, MD;  Location: [facility];  Service: Gastroenterology;  Laterality: N/A;   ESOPHAGOGASTRODUODENOSCOPY (EGD) WITH PROPOFOL N/A 10/17/2017   Procedure: ESOPHAGOGASTRODUODENOSCOPY (EGD) WITH PROPOFOL;  Surgeon: Otis Brace, MD;  Location: [facility];  Service: Gastroenterology;  Laterality: N/A;   ESOPHAGOGASTRODUODENOSCOPY (EGD) WITH PROPOFOL N/A 10/19/2017   Procedure: ESOPHAGOGASTRODUODENOSCOPY (EGD) WITH PROPOFOL;  Surgeon: Otis Brace, MD;  Location: [facility];  Service: Gastroenterology;  Laterality: N/A;   GIVENS CAPSULE STUDY N/A 10/02/2017   Procedure: GIVENS CAPSULE STUDY;  Surgeon: [name], MD;  Location: [facility];  Service: Gastroenterology;  Laterality: N/A;   GIVENS CAPSULE STUDY N/A 10/08/2017   Procedure: GIVENS CAPSULE STUDY;  Surgeon: Otis Brace, MD;  Location: [facility];  Service: Gastroenterology;  Laterality: N/A;  endoscopic placement of capsule   HOT HEMOSTASIS N/A 04/27/2014   Procedure: HOT HEMOSTASIS (ARGON PLASMA COAGULATION/BICAP);  Surgeon: Cleotis Nipper, MD;  Location: [facility] ENDOSCOPY;  Service: Endoscopy;  Laterality: N/A;   HOT HEMOSTASIS N/A 09/30/2017   Procedure: HOT HEMOSTASIS (ARGON PLASMA COAGULATION/BICAP);  Surgeon: [name], MD;  Location: [place];  Service: Gastroenterology;  Laterality: N/A;   HOT HEMOSTASIS N/A 10/01/2017   Procedure: HOT HEMOSTASIS (ARGON PLASMA COAGULATION/BICAP);  Surgeon: [name], MD;  Location: [place];  Service: Gastroenterology;  Laterality: N/A;   HOT HEMOSTASIS N/A 10/17/2017   Procedure: HOT HEMOSTASIS (ARGON PLASMA COAGULATION/BICAP);  Surgeon: Otis Brace, MD;  Location: [facility] ENDOSCOPY;  Service: Gastroenterology;  Laterality: N/A;   HOT HEMOSTASIS N/A 10/19/2017    Procedure: HOT HEMOSTASIS (ARGON PLASMA COAGULATION/BICAP);  Surgeon: Otis Brace, MD;  Location: [facility] ENDOSCOPY;  Service: Gastroenterology;  Laterality: N/A;   IR IMAGING GUIDED PORT INSERTION  07/08/2018   L shoulder Surgery  2011   SUBMUCOSAL INJECTION  09/22/2017   Procedure: SUBMUCOSAL INJECTION;  Surgeon: Clarene Essex, MD;  Location: [facility] ENDOSCOPY;  Service: Endoscopy;;    I have reviewed the social history and family history with the patient and they are unchanged from previous note.  ALLERGIES:  is allergic to feraheme [ferumoxytol]; nsaids; tomato; and wasp venom.  MEDICATIONS:  Current Outpatient Medications  Medication Sig Dispense Refill   albuterol (PROVENTIL HFA;VENTOLIN HFA) 108 (90 Base) MCG/ACT inhaler Inhale 1-2 puffs into the lungs every 6 (six) hours as needed for wheezing or shortness of breath. 1 Inhaler 3   ALPRAZolam (XANAX) 0.5 MG tablet Take 1 tablet (0.5 mg total) by mouth 2 (two) times daily as needed for anxiety. 60 tablet 1   Aminocaproic Acid (AMICAR) 1000 MG TABS Take 1 tablet (1,000 mg total) by mouth 2 (two) times daily. 60 each 3   amLODipine (NORVASC) 10 MG tablet Take 1 tablet (10 mg total) by mouth daily. 90 tablet 0   hydrochlorothiazide (HYDRODIURIL) 12.5 MG tablet TAKE 1 TABLET(12.5 MG) BY MOUTH DAILY 90 tablet 0   pantoprazole (PROTONIX) 40 MG tablet Take 1 tablet (40 mg total) by mouth daily. 30 tablet 3   sertraline (ZOLOFT) 50 MG tablet TAKE 1 TABLET(50 MG) BY MOUTH DAILY 90 tablet 0   atorvastatin (LIPITOR) 80 MG tablet TAKE 1 TABLET(80 MG) BY MOUTH DAILY 30 tablet 3   ferrous gluconate (FERGON) 324 MG tablet Take 1 tablet (324 mg total) by mouth 3 (three) times daily with meals. 90 tablet 0  lidocaine-prilocaine (EMLA) cream Apply 1 application topically as needed. 30 g 0   prochlorperazine (COMPAZINE) 10 MG tablet Take 1 tablet (10 mg total) by mouth every 6 (six) hours as needed for nausea or vomiting. 30 tablet 1   tamoxifen  (NOLVADEX) 20 MG tablet Take 1 tablet (20 mg total) by mouth daily. 90 tablet 3   No current facility-administered medications for this visit.    Facility-Administered Medications Ordered in Other Visits  Medication Dose Route Frequency Provider Last Rate Last Dose   0.9 %  sodium chloride infusion   Intravenous Once Truitt Merle, MD       ferric gluconate (NULECIT) 125 mg in sodium chloride 0.9 % 100 mL IVPB  125 mg Intravenous Once Truitt Merle, MD 110 mL/hr at 10/14/18 1508 125 mg at 10/14/18 1508   heparin lock flush 100 unit/mL  500 Units Intracatheter Once PRN Truitt Merle, MD       sodium chloride flush (NS) 0.9 % injection 10 mL  10 mL Intracatheter PRN Truitt Merle, MD        PHYSICAL EXAMINATION: ECOG PERFORMANCE STATUS: 1 - Symptomatic but completely ambulatory  Vitals:   10/14/18 1241  BP: 132/70  Pulse: 80  Resp: 18  Temp: 98.2 F (36.8 C)  SpO2: 100%   Filed Weights   10/14/18 1241  Weight: 272 lb 9.6 oz (123.7 kg)    GENERAL:alert, no distress and comfortable SKIN: no rashes or significant lesions EYES: sclera clear OROPHARYNX:no thrush or ulcers on tongue  LUNGS: clear to auscultation with normal breathing effort HEART: regular rate & rhythm, positive murmur, no lower extremity edema ABDOMEN:abdomen soft, non-tender, positive BS Musculoskeletal:no cyanosis of digits  NEURO: alert & oriented x 3 with fluent speech, normal gait PAC without erythema   LABORATORY DATA:  I have reviewed the data as listed CBC Latest Ref Rng & Units 10/14/2018 10/02/2018 09/18/2018  WBC 4.0 - 10.5 K/uL 12.3(H) 13.1(H) 10.3  Hemoglobin 12.0 - 15.0 g/dL 6.8(LL) 7.4(L) 8.1(L)  Hematocrit 36.0 - 46.0 % 24.3(L) 27.2(L) 29.5(L)  Platelets 150 - 400 K/uL 310 233 295     CMP Latest Ref Rng & Units 10/14/2018 09/11/2018 03/20/2018  Glucose 70 - 99 mg/dL 133(H) 164(H) 126(H)  BUN 6 - 20 mg/dL 11 12 11   Creatinine 0.44 - 1.00 mg/dL 0.97 0.89 0.86  Sodium 135 - 145 mmol/L 140 140 142  Potassium  3.5 - 5.1 mmol/L 3.3(L) 3.5 3.6  Chloride 98 - 111 mmol/L 109 107 107  CO2 22 - 32 mmol/L 23 25 25   Calcium 8.9 - 10.3 mg/dL 8.2(L) 8.8(L) 8.8(L)  Total Protein 6.5 - 8.1 g/dL 6.8 6.9 7.3  Total Bilirubin 0.3 - 1.2 mg/dL <0.2(L) 0.3 0.2(L)  Alkaline Phos 38 - 126 U/L 59 80 76  AST 15 - 41 U/L 11(L) 11(L) 11(L)  ALT 0 - 44 U/L 6 7 <6      RADIOGRAPHIC STUDIES: I have personally reviewed the radiological images as listed and agreed with the findings in the report. No results found.   ASSESSMENT & PLAN: Peggy House is a 58 y.o. female with   1. 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 Reactive Leukocytosis and reactive thrombocytosis  4. 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 On Amicar. Followed by GI, Dr. Cristina Gong and [facility]   Peggy House appears stable. She continues Avastin q2 weeks, she is tolerating well. She developed black stools and associated drop in Hgb to 6.8 today and elevated retic, concerning for recurrent GI bleed. She is symptomatic of anemia. Last iron infusion 2 weeks ago and RBC last week for Hgb 7.4. She has been out of tamoxifen and protonix lately. I refilled. Will proceed with Avastin and IV ferric gluconate today. She will receive 1 unit RBC this week. I encouraged her to remain compliant with the treatment plan and keep appointments. Will check lab weekly with iron infusion, and continue avastin q2 weeks. F/u in 2 weeks. I encouraged  her to call [facility] to see if she needs sooner f/u, she is scheduled later this month. I reviewed with Dr. Burr Medico.   Labs reviewed, Hgb 6.8, PLT 310, WBC 12.3; iron and ferritin low. She does not take oral iron supplement. Will continue monitoring.   I reviewed if she has persistent black stool or overt rectal bleeding she should go to ED for evaluation. She understands.   PLAN: -Labs reviewed -avastin and IV ferric gluconate today -1 unit RBC this week  -Lab, iron infusion weekly  -F/u and next avastin in 2 weeks  -F/u with GI -If symptoms worsen, go to ED for eval -Refilled tamoxifen, protonix, and compazine  Orders Placed This Encounter  Procedures   Practitioner attestation of consent    I, the ordering practitioner, attest that I have discussed with the patient the benefits, risks, side effects, alternatives, likelihood of achieving goals and potential problems during recovery for the procedure listed.    Standing Status:   Future    Standing Expiration Date:   10/14/2019    Order Specific Question:   Procedure    Answer:   Blood Product(s)   Complete patient signature process for consent form    Standing Status:   Future    Standing Expiration Date:   10/14/2019   Care order/instruction    Transfuse Parameters    Standing Status:   Future    Standing Expiration Date:   10/14/2019   Type and screen    Standing Status:   Future    Standing Expiration Date:   10/14/2019    All questions were answered. The patient knows to call the clinic with any problems, questions or concerns. No barriers to learning was detected.     Alla Feeling, NP 10/14/18

## 2018-10-14 NOTE — Patient Instructions (Signed)
[facility] Discharge Instructions for Patients Receiving Chemotherapy  Today you received the following chemotherapy agents Avastin  To help prevent nausea and vomiting after your treatment, we encourage you to take your nausea medication as directed   If you develop nausea and vomiting that is not controlled by your nausea medication, call the clinic.   BELOW ARE SYMPTOMS THAT SHOULD BE REPORTED IMMEDIATELY:  *FEVER GREATER THAN 100.5 F  *CHILLS WITH OR WITHOUT FEVER  NAUSEA AND VOMITING THAT IS NOT CONTROLLED WITH YOUR NAUSEA MEDICATION  *UNUSUAL SHORTNESS OF BREATH  *UNUSUAL BRUISING OR BLEEDING  TENDERNESS IN MOUTH AND THROAT WITH OR WITHOUT PRESENCE OF ULCERS  *URINARY PROBLEMS  *BOWEL PROBLEMS  UNUSUAL RASH Items with * indicate a potential emergency and should be followed up as soon as possible.  Feel free to call the clinic should you have any questions or concerns. The clinic phone number is [phone].  Please show the CHEMO ALERT CARD at check-in to the Emergency Department and triage nurse.   

## 2018-10-15 ENCOUNTER — Telehealth: Payer: Self-pay | Admitting: Nurse Practitioner

## 2018-10-15 DIAGNOSIS — M1990 Unspecified osteoarthritis, unspecified site: Secondary | ICD-10-CM | POA: Diagnosis not present

## 2018-10-15 LAB — PREPARE RBC (CROSSMATCH)

## 2018-10-15 NOTE — Addendum Note (Signed)
Addended by: Lenox Ponds E on: 10/15/2018 09:01 AM   Modules accepted: Orders

## 2018-10-15 NOTE — Telephone Encounter (Signed)
Scheduled appt per 7/1 los. Spoke with patient and she is aware of her appt date and time.

## 2018-10-16 ENCOUNTER — Other Ambulatory Visit: Payer: Self-pay

## 2018-10-16 ENCOUNTER — Inpatient Hospital Stay: Payer: Medicaid Other

## 2018-10-16 DIAGNOSIS — D500 Iron deficiency anemia secondary to blood loss (chronic): Secondary | ICD-10-CM

## 2018-10-16 DIAGNOSIS — M1990 Unspecified osteoarthritis, unspecified site: Secondary | ICD-10-CM | POA: Diagnosis not present

## 2018-10-16 DIAGNOSIS — I780 Hereditary hemorrhagic telangiectasia: Secondary | ICD-10-CM

## 2018-10-16 LAB — PREPARE RBC (CROSSMATCH)

## 2018-10-16 MED ORDER — SODIUM CHLORIDE 0.9% FLUSH
3.0000 mL | INTRAVENOUS | Status: DC | PRN
  Filled 2018-10-16: qty 10

## 2018-10-16 MED ORDER — HEPARIN SOD (PORK) LOCK FLUSH 100 UNIT/ML IV SOLN
500.0000 [IU] | Freq: Once | INTRAVENOUS | Status: AC
  Administered 2018-10-16: 500 [IU] via INTRAVENOUS
  Filled 2018-10-16: qty 5

## 2018-10-16 MED ORDER — ACETAMINOPHEN 160 MG/5ML PO SOLN: 650.0000 mg | Freq: Once | ORAL | Status: DC

## 2018-10-16 MED ORDER — DIPHENHYDRAMINE HCL 25 MG PO CAPS
ORAL_CAPSULE | ORAL | Status: AC
  Filled 2018-10-16: qty 1

## 2018-10-16 MED ORDER — ACETAMINOPHEN 325 MG PO TABS
ORAL_TABLET | ORAL | Status: AC
  Filled 2018-10-16: qty 2

## 2018-10-16 MED ORDER — HEPARIN SOD (PORK) LOCK FLUSH 100 UNIT/ML IV SOLN
250.0000 [IU] | INTRAVENOUS | Status: DC | PRN
  Filled 2018-10-16: qty 5

## 2018-10-16 MED ORDER — DIPHENHYDRAMINE HCL 25 MG PO CAPS
25.0000 mg | ORAL_CAPSULE | Freq: Once | ORAL | Status: AC
  Administered 2018-10-16: 25 mg via ORAL

## 2018-10-16 MED ORDER — ACETAMINOPHEN 325 MG PO TABS
650.0000 mg | ORAL_TABLET | Freq: Once | ORAL | Status: AC
  Administered 2018-10-16: 650 mg via ORAL

## 2018-10-16 MED ORDER — SODIUM CHLORIDE 0.9% IV SOLUTION
250.0000 mL | Freq: Once | INTRAVENOUS | Status: AC
  Administered 2018-10-16: 250 mL via INTRAVENOUS
  Filled 2018-10-16: qty 250

## 2018-10-16 MED ORDER — DIPHENHYDRAMINE HCL 25 MG PO TABS: 25.0000 mg | ORAL_TABLET | Freq: Once | ORAL | Status: DC

## 2018-10-16 MED ORDER — SODIUM CHLORIDE 0.9% FLUSH
10.0000 mL | Freq: Once | INTRAVENOUS | Status: AC
  Administered 2018-10-16: 10 mL via INTRAVENOUS
  Filled 2018-10-16: qty 10

## 2018-10-16 NOTE — Patient Instructions (Signed)
Blood Transfusion, Adult A blood transfusion is a procedure in which you are given blood through an IV tube. You may need this procedure because of:  Illness.  Surgery.  Injury. The blood may come from someone else (a donor). You may also be able to donate blood for yourself (autologous blood donation). The blood given in a transfusion is made up of different types of cells. You may get:  Red blood cells. These carry oxygen to the cells in the body.  White blood cells. These help you fight infections.  Platelets. These help your blood to clot.  Plasma. This is the liquid part of your blood. It helps with fluid imbalances. If you have a clotting disorder, you may also get other types of blood products. What happens before the procedure?  You will have a blood test to find out your blood type. The test also finds out what type of blood your body will accept and matches it to the donor type.  If you are going to have a planned surgery, you may be able to donate your own blood. This may be done in case you need a transfusion.  If you have had an allergic reaction to a transfusion in the past, you may be given medicine to help prevent a reaction. This medicine may be given to you by mouth or through an IV.  You will have your temperature, blood pressure, and pulse checked.  Follow instructions from your doctor about what you cannot eat or drink.  Ask your doctor about: ? Changing or stopping your regular medicines. This is important if you take diabetes medicines or blood thinners. ? Taking medicines such as aspirin and ibuprofen. These medicines can thin your blood. Do not take these medicines before your procedure if your doctor tells you not to. What happens during the procedure?  An IV tube will be put into one of your veins.  The bag of donated blood will be attached to your IV tube. Then, the blood will enter through your vein.  Your temperature, blood pressure, and pulse will  be checked regularly during the procedure. This is done to find early signs of a transfusion reaction.  If you have any signs or symptoms of a reaction, your transfusion will be stopped. You may also be given medicine.  When the transfusion is done, your IV tube will be taken out.  Pressure may be applied to the IV site for a few minutes.  A bandage (dressing) will be put on the IV site. The procedure may vary among doctors and hospitals. What happens after the procedure?  Your temperature, blood pressure, heart rate, breathing rate, and blood oxygen level will be checked often.  Your blood may be tested to see how you are responding to the transfusion.  You may be warmed with fluids or blankets. This is done to keep the temperature of your body normal. Summary  A blood transfusion is a procedure in which you are given blood through an IV tube.  The blood may come from someone else (a donor). You may also be able to donate blood for yourself.  If you have had an allergic reaction to a transfusion in the past, you may be given medicine to help prevent a reaction. This medicine may be given to you by mouth or through an IV tube.  Your temperature, blood pressure, heart rate, breathing rate, and blood oxygen level will be checked often.  Your blood may be tested to see   how you are responding to the transfusion. This information is not intended to replace advice given to you by your health care provider. Make sure you discuss any questions you have with your health care provider. Document Released: 06/28/2008 Document Revised: 05/22/2016 Document Reviewed: 11/24/2015 Elsevier Patient Education  2020 Elsevier Inc.  

## 2018-10-17 LAB — BPAM RBC
Blood Product Expiration Date: 202007272359
ISSUE DATE / TIME: 202007031018
Unit Type and Rh: 600

## 2018-10-17 LAB — TYPE AND SCREEN
ABO/RH(D): A NEG
Antibody Screen: NEGATIVE
Unit division: 0

## 2018-10-19 DIAGNOSIS — M1990 Unspecified osteoarthritis, unspecified site: Secondary | ICD-10-CM | POA: Diagnosis not present

## 2018-10-20 DIAGNOSIS — M1990 Unspecified osteoarthritis, unspecified site: Secondary | ICD-10-CM | POA: Diagnosis not present

## 2018-10-21 DIAGNOSIS — M1990 Unspecified osteoarthritis, unspecified site: Secondary | ICD-10-CM | POA: Diagnosis not present

## 2018-10-22 ENCOUNTER — Inpatient Hospital Stay: Payer: Medicaid Other

## 2018-10-22 ENCOUNTER — Other Ambulatory Visit: Payer: Self-pay

## 2018-10-22 VITALS — BP 144/70 | HR 72 | Temp 98.9°F | Resp 18

## 2018-10-22 DIAGNOSIS — D500 Iron deficiency anemia secondary to blood loss (chronic): Secondary | ICD-10-CM

## 2018-10-22 DIAGNOSIS — Z95828 Presence of other vascular implants and grafts: Secondary | ICD-10-CM

## 2018-10-22 DIAGNOSIS — M1990 Unspecified osteoarthritis, unspecified site: Secondary | ICD-10-CM | POA: Diagnosis not present

## 2018-10-22 LAB — CBC WITH DIFFERENTIAL (CANCER CENTER ONLY)
Abs Immature Granulocytes: 0.05 10*3/uL (ref 0.00–0.07)
Basophils Absolute: 0 10*3/uL (ref 0.0–0.1)
Basophils Relative: 0 %
Eosinophils Absolute: 0.1 10*3/uL (ref 0.0–0.5)
Eosinophils Relative: 1 %
HCT: 28.3 % — ABNORMAL LOW (ref 36.0–46.0)
Hemoglobin: 8 g/dL — ABNORMAL LOW (ref 12.0–15.0)
Immature Granulocytes: 0 %
Lymphocytes Relative: 10 %
Lymphs Abs: 1.3 10*3/uL (ref 0.7–4.0)
MCH: 24.4 pg — ABNORMAL LOW (ref 26.0–34.0)
MCHC: 28.3 g/dL — ABNORMAL LOW (ref 30.0–36.0)
MCV: 86.3 fL (ref 80.0–100.0)
Monocytes Absolute: 0.5 10*3/uL (ref 0.1–1.0)
Monocytes Relative: 4 %
Neutro Abs: 11.1 10*3/uL — ABNORMAL HIGH (ref 1.7–7.7)
Neutrophils Relative %: 85 %
Platelet Count: 261 10*3/uL (ref 150–400)
RBC: 3.28 MIL/uL — ABNORMAL LOW (ref 3.87–5.11)
RDW: 21.2 % — ABNORMAL HIGH (ref 11.5–15.5)
WBC Count: 13.1 10*3/uL — ABNORMAL HIGH (ref 4.0–10.5)
nRBC: 0 % (ref 0.0–0.2)

## 2018-10-22 LAB — CMP (CANCER CENTER ONLY)
ALT: 7 U/L (ref 0–44)
AST: 12 U/L — ABNORMAL LOW (ref 15–41)
Albumin: 3.2 g/dL — ABNORMAL LOW (ref 3.5–5.0)
Alkaline Phosphatase: 72 U/L (ref 38–126)
Anion gap: 9 (ref 5–15)
BUN: 10 mg/dL (ref 6–20)
CO2: 24 mmol/L (ref 22–32)
Calcium: 8.1 mg/dL — ABNORMAL LOW (ref 8.9–10.3)
Chloride: 110 mmol/L (ref 98–111)
Creatinine: 0.97 mg/dL (ref 0.44–1.00)
GFR, Est AFR Am: >60 mL/min (ref >60)
GFR, Estimated: >60 mL/min (ref >60)
Glucose, Bld: 167 mg/dL — ABNORMAL HIGH (ref 70–99)
Potassium: 3.8 mmol/L (ref 3.5–5.1)
Sodium: 143 mmol/L (ref 135–145)
Total Bilirubin: 0.2 mg/dL — ABNORMAL LOW (ref 0.3–1.2)
Total Protein: 7 g/dL (ref 6.5–8.1)

## 2018-10-22 LAB — FERRITIN: Ferritin: 59 ng/mL (ref 11–307)

## 2018-10-22 LAB — IRON AND TIBC
Iron: 18 ug/dL — ABNORMAL LOW (ref 41–142)
Saturation Ratios: 6 % — ABNORMAL LOW (ref 21–57)
TIBC: 293 ug/dL (ref 236–444)
UIBC: 274 ug/dL (ref 120–384)

## 2018-10-22 LAB — RETICULOCYTES
Immature Retic Fract: 33.1 % — ABNORMAL HIGH (ref 2.3–15.9)
RBC.: 3.28 MIL/uL — ABNORMAL LOW (ref 3.87–5.11)
Retic Count, Absolute: 81.3 10*3/uL (ref 19.0–186.0)
Retic Ct Pct: 2.5 % (ref 0.4–3.1)

## 2018-10-22 MED ORDER — SODIUM CHLORIDE 0.9 % IV SOLN
125.0000 mg | Freq: Once | INTRAVENOUS | Status: AC
  Administered 2018-10-22: 125 mg via INTRAVENOUS
  Filled 2018-10-22: qty 10

## 2018-10-22 MED ORDER — SODIUM CHLORIDE 0.9% FLUSH
10.0000 mL | Freq: Once | INTRAVENOUS | Status: AC
  Administered 2018-10-22: 10 mL
  Filled 2018-10-22: qty 10

## 2018-10-22 MED ORDER — HEPARIN SOD (PORK) LOCK FLUSH 100 UNIT/ML IV SOLN
500.0000 [IU] | Freq: Once | INTRAVENOUS | Status: AC
  Administered 2018-10-22: 500 [IU]
  Filled 2018-10-22: qty 5

## 2018-10-22 MED ORDER — SODIUM CHLORIDE 0.9 % IV SOLN
INTRAVENOUS | Status: DC
  Administered 2018-10-22: 15:00:00 via INTRAVENOUS
  Filled 2018-10-22: qty 250

## 2018-10-22 NOTE — Patient Instructions (Signed)
Sodium Ferric Gluconate Complex injection What is this medicine? SODIUM FERRIC GLUCONATE COMPLEX (SOE dee um FER ik GLOO koe nate KOM pleks) is an iron replacement. It is used with epoetin therapy to treat low iron levels in patients who are receiving hemodialysis. This medicine may be used for other purposes; ask your health care provider or pharmacist if you have questions. COMMON BRAND NAME(S): Ferrlecit, Nulecit What should I tell my health care provider before I take this medicine? They need to know if you have any of the following conditions:  anemia that is not from iron deficiency  high levels of iron in the body  an unusual or allergic reaction to iron, benzyl alcohol, other medicines, foods, dyes, or preservatives  pregnant or are trying to become pregnant  breast-feeding How should I use this medicine? This medicine is for infusion into a vein. It is given by a health care professional in a hospital or clinic setting. Talk to your pediatrician regarding the use of this medicine in children. While this drug may be prescribed for children as young as 6 years old for selected conditions, precautions do apply. Overdosage: If you think you have taken too much of this medicine contact a poison control center or emergency room at once. NOTE: This medicine is only for you. Do not share this medicine with others. What if I miss a dose? It is important not to miss your dose. Call your doctor or health care professional if you are unable to keep an appointment. What may interact with this medicine? Do not take this medicine with any of the following medications:  deferoxamine  dimercaprol  other iron products This medicine may also interact with the following medications:  chloramphenicol  deferasirox  medicine for blood pressure like enalapril This list may not describe all possible interactions. Give your health care provider a list of all the medicines, herbs,  non-prescription drugs, or dietary supplements you use. Also tell them if you smoke, drink alcohol, or use illegal drugs. Some items may interact with your medicine. What should I watch for while using this medicine? Your condition will be monitored carefully while you are receiving this medicine. Visit your doctor for check-ups as directed. What side effects may I notice from receiving this medicine? Side effects that you should report to your doctor or health care professional as soon as possible:  allergic reactions like skin rash, itching or hives, swelling of the face, lips, or tongue  breathing problems  changes in hearing  changes in vision  chills, flushing, or sweating  fast, irregular heartbeat  feeling faint or lightheaded, falls  fever, flu-like symptoms  high or low blood pressure  pain, tingling, numbness in the hands or feet  severe pain in the chest, back, flanks, or groin  swelling of the ankles, feet, hands  trouble passing urine or change in the amount of urine  unusually weak or tired Side effects that usually do not require medical attention (report to your doctor or health care professional if they continue or are bothersome):  cramps  dark colored stools  diarrhea  headache  nausea, vomiting  stomach upset This list may not describe all possible side effects. Call your doctor for medical advice about side effects. You may report side effects to FDA at 1-800-FDA-1088. Where should I keep my medicine? This drug is given in a hospital or clinic and will not be stored at home. NOTE: This sheet is a summary. It may not cover all   possible information. If you have questions about this medicine, talk to your doctor, pharmacist, or health care provider.  2020 Elsevier/Gold Standard (2007-12-02 15:58:57)  Coronavirus (COVID-19) Are you at risk?  Are you at risk for the Coronavirus (COVID-19)?  To be considered HIGH RISK for Coronavirus  (COVID-19), you have to meet the following criteria:  . Traveled to [place], [place], [place], [place] or [place]; or in the [place] to [place], [place], [place], or [place]; and have fever, cough, and shortness of breath within the last 2 weeks of travel OR . Been in close contact with a person diagnosed with COVID-19 within the last 2 weeks and have fever, cough, and shortness of breath . IF YOU DO NOT MEET THESE CRITERIA, YOU ARE CONSIDERED LOW RISK FOR COVID-19.  What to do if you are HIGH RISK for COVID-19?  . If you are having a medical emergency, call 911. . Seek medical care right away. Before you go to a doctor's office, urgent care or emergency department, call ahead and tell them about your recent travel, contact with someone diagnosed with COVID-19, and your symptoms. You should receive instructions from your physician's office regarding next steps of care.  . When you arrive at healthcare provider, tell the healthcare staff immediately you have returned from visiting [place], [place], [place], [place] or [place]; or traveled in the [place] to [place], [place], [place], or [place]; in the last two weeks or you have been in close contact with a person diagnosed with COVID-19 in the last 2 weeks.   . Tell the health care staff about your symptoms: fever, cough and shortness of breath. . After you have been seen by a medical provider, you will be either: o Tested for (COVID-19) and discharged home on quarantine except to seek medical care if symptoms worsen, and asked to  - Stay home and avoid contact with others until you get your results (4-5 days)  - Avoid travel on public transportation if possible (such as bus, train, or airplane) or o Sent to the Emergency Department by EMS for evaluation, COVID-19 testing, and possible admission depending on your condition and test results.  What to do if you are LOW RISK for COVID-19?  Reduce your risk of any infection by  using the same precautions used for avoiding the common cold or flu:  . Wash your hands often with soap and warm water for at least 20 seconds.  If soap and water are not readily available, use an alcohol-based hand sanitizer with at least 60% alcohol.  . If coughing or sneezing, cover your mouth and nose by coughing or sneezing into the elbow areas of your shirt or coat, into a tissue or into your sleeve (not your hands). . Avoid shaking hands with others and consider head nods or verbal greetings only. . Avoid touching your eyes, nose, or mouth with unwashed hands.  . Avoid close contact with people who are sick. . Avoid places or events with large numbers of people in one location, like concerts or sporting events. . Carefully consider travel plans you have or are making. . If you are planning any travel outside or inside the [place], visit the CDC's Travelers' Health webpage for the latest health notices. . If you have some symptoms but not all symptoms, continue to monitor at home and seek medical attention if your symptoms worsen. . If you are having a medical emergency, call 911.   ADDITIONAL HEALTHCARE   OPTIONS FOR PATIENTS  [facility] / e-Visit: [email or web address]         [facility]: [phone]  [facility]: [phone]                   [facility]: [phone]   

## 2018-10-23 ENCOUNTER — Other Ambulatory Visit: Payer: Self-pay

## 2018-10-23 DIAGNOSIS — D500 Iron deficiency anemia secondary to blood loss (chronic): Secondary | ICD-10-CM

## 2018-10-23 DIAGNOSIS — M1990 Unspecified osteoarthritis, unspecified site: Secondary | ICD-10-CM | POA: Diagnosis not present

## 2018-10-26 DIAGNOSIS — M1990 Unspecified osteoarthritis, unspecified site: Secondary | ICD-10-CM | POA: Diagnosis not present

## 2018-10-27 DIAGNOSIS — M1990 Unspecified osteoarthritis, unspecified site: Secondary | ICD-10-CM | POA: Diagnosis not present

## 2018-10-28 DIAGNOSIS — M1990 Unspecified osteoarthritis, unspecified site: Secondary | ICD-10-CM | POA: Diagnosis not present

## 2018-10-29 ENCOUNTER — Other Ambulatory Visit: Payer: Self-pay | Admitting: Medical

## 2018-10-29 ENCOUNTER — Inpatient Hospital Stay: Payer: Medicaid Other

## 2018-10-29 ENCOUNTER — Inpatient Hospital Stay (HOSPITAL_BASED_OUTPATIENT_CLINIC_OR_DEPARTMENT_OTHER): Payer: Medicaid Other | Admitting: Nurse Practitioner

## 2018-10-29 ENCOUNTER — Ambulatory Visit: Payer: Medicaid Other | Admitting: Nurse Practitioner

## 2018-10-29 ENCOUNTER — Encounter: Payer: Self-pay | Admitting: Nurse Practitioner

## 2018-10-29 ENCOUNTER — Other Ambulatory Visit: Payer: Medicaid Other

## 2018-10-29 ENCOUNTER — Other Ambulatory Visit: Payer: Self-pay

## 2018-10-29 VITALS — BP 125/72 | HR 71 | Temp 99.0°F | Resp 18

## 2018-10-29 DIAGNOSIS — R05 Cough: Secondary | ICD-10-CM

## 2018-10-29 DIAGNOSIS — D500 Iron deficiency anemia secondary to blood loss (chronic): Secondary | ICD-10-CM

## 2018-10-29 DIAGNOSIS — M1990 Unspecified osteoarthritis, unspecified site: Secondary | ICD-10-CM | POA: Diagnosis not present

## 2018-10-29 DIAGNOSIS — K922 Gastrointestinal hemorrhage, unspecified: Secondary | ICD-10-CM

## 2018-10-29 DIAGNOSIS — I780 Hereditary hemorrhagic telangiectasia: Secondary | ICD-10-CM | POA: Diagnosis not present

## 2018-10-29 DIAGNOSIS — R509 Fever, unspecified: Secondary | ICD-10-CM

## 2018-10-29 DIAGNOSIS — I10 Essential (primary) hypertension: Secondary | ICD-10-CM

## 2018-10-29 DIAGNOSIS — R058 Other specified cough: Secondary | ICD-10-CM

## 2018-10-29 DIAGNOSIS — E669 Obesity, unspecified: Secondary | ICD-10-CM | POA: Diagnosis not present

## 2018-10-29 DIAGNOSIS — Z95828 Presence of other vascular implants and grafts: Secondary | ICD-10-CM

## 2018-10-29 DIAGNOSIS — R0602 Shortness of breath: Secondary | ICD-10-CM

## 2018-10-29 DIAGNOSIS — R059 Cough, unspecified: Secondary | ICD-10-CM

## 2018-10-29 LAB — CMP (CANCER CENTER ONLY)
ALT: 6 U/L (ref 0–44)
AST: 11 U/L — ABNORMAL LOW (ref 15–41)
Albumin: 3.2 g/dL — ABNORMAL LOW (ref 3.5–5.0)
Alkaline Phosphatase: 62 U/L (ref 38–126)
Anion gap: 10 (ref 5–15)
BUN: 9 mg/dL (ref 6–20)
CO2: 25 mmol/L (ref 22–32)
Calcium: 8.4 mg/dL — ABNORMAL LOW (ref 8.9–10.3)
Chloride: 106 mmol/L (ref 98–111)
Creatinine: 1.06 mg/dL — ABNORMAL HIGH (ref 0.44–1.00)
GFR, Est AFR Am: >60 mL/min (ref >60)
GFR, Estimated: 58 mL/min — ABNORMAL LOW (ref >60)
Glucose, Bld: 126 mg/dL — ABNORMAL HIGH (ref 70–99)
Potassium: 3.3 mmol/L — ABNORMAL LOW (ref 3.5–5.1)
Sodium: 141 mmol/L (ref 135–145)
Total Bilirubin: 0.2 mg/dL — ABNORMAL LOW (ref 0.3–1.2)
Total Protein: 7.2 g/dL (ref 6.5–8.1)

## 2018-10-29 LAB — IRON AND TIBC
Iron: 15 ug/dL — ABNORMAL LOW (ref 41–142)
Saturation Ratios: 5 % — ABNORMAL LOW (ref 21–57)
TIBC: 266 ug/dL (ref 236–444)
UIBC: 252 ug/dL (ref 120–384)

## 2018-10-29 LAB — CBC WITH DIFFERENTIAL (CANCER CENTER ONLY)
Abs Immature Granulocytes: 0.06 10*3/uL (ref 0.00–0.07)
Basophils Absolute: 0 10*3/uL (ref 0.0–0.1)
Basophils Relative: 0 %
Eosinophils Absolute: 0.1 10*3/uL (ref 0.0–0.5)
Eosinophils Relative: 1 %
HCT: 26.1 % — ABNORMAL LOW (ref 36.0–46.0)
Hemoglobin: 7.5 g/dL — ABNORMAL LOW (ref 12.0–15.0)
Immature Granulocytes: 0 %
Lymphocytes Relative: 11 %
Lymphs Abs: 1.5 10*3/uL (ref 0.7–4.0)
MCH: 23.9 pg — ABNORMAL LOW (ref 26.0–34.0)
MCHC: 28.7 g/dL — ABNORMAL LOW (ref 30.0–36.0)
MCV: 83.1 fL (ref 80.0–100.0)
Monocytes Absolute: 0.7 10*3/uL (ref 0.1–1.0)
Monocytes Relative: 5 %
Neutro Abs: 11.4 10*3/uL — ABNORMAL HIGH (ref 1.7–7.7)
Neutrophils Relative %: 83 %
Platelet Count: 280 10*3/uL (ref 150–400)
RBC: 3.14 MIL/uL — ABNORMAL LOW (ref 3.87–5.11)
RDW: 21.2 % — ABNORMAL HIGH (ref 11.5–15.5)
WBC Count: 13.6 10*3/uL — ABNORMAL HIGH (ref 4.0–10.5)
nRBC: 0 % (ref 0.0–0.2)

## 2018-10-29 LAB — RETICULOCYTES
Immature Retic Fract: 19.8 % — ABNORMAL HIGH (ref 2.3–15.9)
RBC.: 3.12 MIL/uL — ABNORMAL LOW (ref 3.87–5.11)
Retic Count, Absolute: 53 10*3/uL (ref 19.0–186.0)
Retic Ct Pct: 1.7 % (ref 0.4–3.1)

## 2018-10-29 LAB — FERRITIN: Ferritin: 103 ng/mL (ref 11–307)

## 2018-10-29 LAB — SAMPLE TO BLOOD BANK

## 2018-10-29 MED ORDER — AMOXICILLIN-POT CLAVULANATE 875-125 MG PO TABS: 1.0000 | ORAL_TABLET | Freq: Two times a day (BID) | ORAL | 0 refills | Status: DC

## 2018-10-29 MED ORDER — SODIUM CHLORIDE 0.9 % IV SOLN
Freq: Once | INTRAVENOUS | Status: DC
  Filled 2018-10-29: qty 250

## 2018-10-29 MED ORDER — SODIUM CHLORIDE 0.9% FLUSH
10.0000 mL | Freq: Once | INTRAVENOUS | Status: AC
  Administered 2018-10-29: 10 mL
  Filled 2018-10-29: qty 10

## 2018-10-29 MED ORDER — SODIUM CHLORIDE 0.9 % IV SOLN
125.0000 mg | Freq: Once | INTRAVENOUS | Status: DC
  Filled 2018-10-29: qty 10

## 2018-10-29 NOTE — Patient Instructions (Signed)
Friday July 17th- covid test at [facility] in am.  Wait for a call with the results  If negative come to cancer center for iron and blood transfusion.

## 2018-10-29 NOTE — Progress Notes (Signed)
Peggy House   [phone] [phone]   Clinic Follow up Note   Patient Care Team: [name], MD as PCP - General (Internal Medicine) 10/29/2018  CHIEF COMPLAINT: f/u HHT and anemia  PREVIOUS THERAPY:  -Multiple EGD, small bowel enteroscope, C-scope with APC -Oral and iv amicar were given during hospital stay, no effect -IR embolization ofof the left gastric and short gastric artery branch vessels without evidence of residual flow in the Dieulafoy lesionon 12/10/17 -Avastin on 8/2 and 8/30 at [facility]; starting 12/26/17 continue 5mg /kg q2 weeksuntil 02/20/18, restarted on 04/03/2018  CURRENT THERAPY  -Tamoxifen started in 10/2017 for HHT -Blood transfusion for severe anemia secondary to GI bleedingas needed -ivferric gluconate every1-2 weeks as needed -RestartedAvastin q2weeks on 04/03/18 -Amicar 1g bidstarting4/06/2018  INTERVAL HISTORY: [name] returns for f/u and treatment as scheduled. She was seen 10/14/18 and received avastin and ferric gluconate. She returned 10/22/18 for another dose IV iron. She continues to have black stool intermittently. But no overt blood per rectum. Stools were brown today. Nosebleeds are stable, usually daily lasting 15 minutes. She notes this is improved from before when they would last an hour each time. Appetite is fair. Intermittent ongoing nausea is stable. Denies vomiting, constipation, diarrhea. Denies abdominal pain. In the interval she has 1 week history of productive cough with dark yellow sputum. She noticed intermittent blood streaks. She has sore throat and chills. Right chest is sore with coughing. No fever, dyspnea, or body aches. Denies covid19 exposure or sick contacts.    MEDICAL HISTORY:  Past Medical History:  Diagnosis Date   Anxiety    Arthritis    knnes,back   GERD (gastroesophageal reflux disease)    Hereditary hemorrhagic telangiectasia (HCC)    History of swelling of feet      Hyperlipidemia    Hypertension    Major depressive disorder, recurrent episode ([place]) 06/05/2015   Obesity    Snores     SURGICAL HISTORY: Past Surgical History:  Procedure Laterality Date   ABDOMINAL HYSTERECTOMY     CARPAL TUNNEL RELEASE  05/13/2011   Procedure: CARPAL TUNNEL RELEASE;  Surgeon: Nita Sells, MD;  Location: [place];  Service: Orthopedics;  Laterality: Left;   COLONOSCOPY WITH PROPOFOL N/A 04/28/2014   Procedure: COLONOSCOPY WITH PROPOFOL;  Surgeon: Cleotis Nipper, MD;  Location: [facility] ENDOSCOPY;  Service: Endoscopy;  Laterality: N/A;   DG TOES*L*  2/10   rt   DILATION AND CURETTAGE OF UTERUS     ENTEROSCOPY N/A 10/17/2017   Procedure: ENTEROSCOPY;  Surgeon: Otis Brace, MD;  Location: [place];  Service: Gastroenterology;  Laterality: N/A;   ESOPHAGOGASTRODUODENOSCOPY N/A 04/10/2014   Procedure: ESOPHAGOGASTRODUODENOSCOPY (EGD);  Surgeon: [name], MD;  Location[facility] ENDOSCOPY;  Service: Endoscopy;  Laterality: N/A;   ESOPHAGOGASTRODUODENOSCOPY N/A 05/10/2017   Procedure: ESOPHAGOGASTRODUODENOSCOPY (EGD);  Surgeon: Ronald Lobo, MD;  Location: [facility] ENDOSCOPY;  Service: Endoscopy;  Laterality: N/A;   ESOPHAGOGASTRODUODENOSCOPY N/A 09/22/2017   Procedure: ESOPHAGOGASTRODUODENOSCOPY (EGD);  Surgeon: Clarene Essex, MD;  Location: [place];  Service: Endoscopy;  Laterality: N/A;  bedside   ESOPHAGOGASTRODUODENOSCOPY (EGD) WITH PROPOFOL N/A 04/27/2014   Procedure: ESOPHAGOGASTRODUODENOSCOPY (EGD) WITH PROPOFOL;  Surgeon: Cleotis Nipper, MD;  Location: [place];  Service: Endoscopy;  Laterality: N/A;  possible apc   ESOPHAGOGASTRODUODENOSCOPY (EGD) WITH PROPOFOL N/A 09/30/2017   Procedure: ESOPHAGOGASTRODUODENOSCOPY (EGD) WITH PROPOFOL;  Surgeon: [name], MD;  Location: [place];  Service: Gastroenterology;  Laterality: N/A;   ESOPHAGOGASTRODUODENOSCOPY (  EGD) WITH PROPOFOL N/A 10/01/2017   Procedure:  ESOPHAGOGASTRODUODENOSCOPY (EGD) WITH PROPOFOL;  Surgeon: [name], MD;  Location: [place];  Service: Gastroenterology;  Laterality: N/A;   ESOPHAGOGASTRODUODENOSCOPY (EGD) WITH PROPOFOL N/A 10/08/2017   Procedure: ESOPHAGOGASTRODUODENOSCOPY (EGD) WITH PROPOFOL;  Surgeon: Otis Brace, MD;  Location: [place];  Service: Gastroenterology;  Laterality: N/A;   ESOPHAGOGASTRODUODENOSCOPY (EGD) WITH PROPOFOL N/A 10/17/2017   Procedure: ESOPHAGOGASTRODUODENOSCOPY (EGD) WITH PROPOFOL;  Surgeon: Otis Brace, MD;  Location: [place];  Service: Gastroenterology;  Laterality: N/A;   ESOPHAGOGASTRODUODENOSCOPY (EGD) WITH PROPOFOL N/A 10/19/2017   Procedure: ESOPHAGOGASTRODUODENOSCOPY (EGD) WITH PROPOFOL;  Surgeon: Otis Brace, MD;  Location: [place];  Service: Gastroenterology;  Laterality: N/A;   GIVENS CAPSULE STUDY N/A 10/02/2017   Procedure: GIVENS CAPSULE STUDY;  Surgeon: [name], MD;  Location: [place];  Service: Gastroenterology;  Laterality: N/A;   GIVENS CAPSULE STUDY N/A 10/08/2017   Procedure: GIVENS CAPSULE STUDY;  Surgeon: Otis Brace, MD;  Location: [place];  Service: Gastroenterology;  Laterality: N/A;  endoscopic placement of capsule   HOT HEMOSTASIS N/A 04/27/2014   Procedure: HOT HEMOSTASIS (ARGON PLASMA COAGULATION/BICAP);  Surgeon: Cleotis Nipper, MD;  Location: [facility] ENDOSCOPY;  Service: Endoscopy;  Laterality: N/A;   HOT HEMOSTASIS N/A 09/30/2017   Procedure: HOT HEMOSTASIS (ARGON PLASMA COAGULATION/BICAP);  Surgeon: [name], MD;  Location: [place];  Service: Gastroenterology;  Laterality: N/A;   HOT HEMOSTASIS N/A 10/01/2017   Procedure: HOT HEMOSTASIS (ARGON PLASMA COAGULATION/BICAP);  Surgeon: [name], MD;  Location: [place];  Service: Gastroenterology;  Laterality: N/A;   HOT HEMOSTASIS N/A 10/17/2017   Procedure: HOT HEMOSTASIS (ARGON PLASMA COAGULATION/BICAP);  Surgeon: Otis Brace, MD;  Location: [facility]  ENDOSCOPY;  Service: Gastroenterology;  Laterality: N/A;   HOT HEMOSTASIS N/A 10/19/2017   Procedure: HOT HEMOSTASIS (ARGON PLASMA COAGULATION/BICAP);  Surgeon: Otis Brace, MD;  Location: [facility] ENDOSCOPY;  Service: Gastroenterology;  Laterality: N/A;   IR IMAGING GUIDED PORT INSERTION  07/08/2018   L shoulder Surgery  2011   SUBMUCOSAL INJECTION  09/22/2017   Procedure: SUBMUCOSAL INJECTION;  Surgeon: Clarene Essex, MD;  Location: [facility] ENDOSCOPY;  Service: Endoscopy;;    I have reviewed the social history and family history with the patient and they are unchanged from previous note.  ALLERGIES:  is allergic to feraheme [ferumoxytol]; nsaids; tomato; and wasp venom.  MEDICATIONS:  Current Outpatient Medications  Medication Sig Dispense Refill   albuterol (PROVENTIL HFA;VENTOLIN HFA) 108 (90 Base) MCG/ACT inhaler Inhale 1-2 puffs into the lungs every 6 (six) hours as needed for wheezing or shortness of breath. 1 Inhaler 3   ALPRAZolam (XANAX) 0.5 MG tablet Take 1 tablet (0.5 mg total) by mouth 2 (two) times daily as needed for anxiety. 60 tablet 1   Aminocaproic Acid (AMICAR) 1000 MG TABS Take 1 tablet (1,000 mg total) by mouth 2 (two) times daily. 60 each 3   amLODipine (NORVASC) 10 MG tablet Take 1 tablet (10 mg total) by mouth daily. 90 tablet 0   atorvastatin (LIPITOR) 80 MG tablet TAKE 1 TABLET(80 MG) BY MOUTH DAILY 30 tablet 3   hydrochlorothiazide (HYDRODIURIL) 12.5 MG tablet TAKE 1 TABLET(12.5 MG) BY MOUTH DAILY 90 tablet 0   lidocaine-prilocaine (EMLA) cream Apply 1 application topically as needed. 30 g 0   pantoprazole (PROTONIX) 40 MG tablet Take 1 tablet (40 mg total) by mouth daily. 30 tablet 3   prochlorperazine (COMPAZINE) 10 MG tablet Take 1 tablet (10 mg total) by mouth every 6 (six) hours as needed for  nausea or vomiting. 30 tablet 1   sertraline (ZOLOFT) 50 MG tablet TAKE 1 TABLET(50 MG) BY MOUTH DAILY 90 tablet 0   tamoxifen (NOLVADEX) 20 MG tablet Take 1 tablet  (20 mg total) by mouth daily. 90 tablet 3   amoxicillin-clavulanate (AUGMENTIN) 875-125 MG tablet Take 1 tablet by mouth 2 (two) times daily. 7 tablet 0   ferrous gluconate (FERGON) 324 MG tablet Take 1 tablet (324 mg total) by mouth 3 (three) times daily with meals. 90 tablet 0   No current facility-administered medications for this visit.    Facility-Administered Medications Ordered in Other Visits  Medication Dose Route Frequency Provider Last Rate Last Dose   0.9 %  sodium chloride infusion   Intravenous Once Truitt Merle, MD       0.9 %  sodium chloride infusion   Intravenous Once Truitt Merle, MD       ferric gluconate (NULECIT) 125 mg in sodium chloride 0.9 % 100 mL IVPB  125 mg Intravenous Once Truitt Merle, MD        PHYSICAL EXAMINATION: ECOG PERFORMANCE STATUS: 1-2 BP 125/72, pulse 70, respirations 19, temp 99, O2 sat 100%  GENERAL:alert, no distress and comfortable SKIN: No obvious rash EYES: sclera clear LUNGS: Distant breath sounds, no wheeze or rhonchi; normal breathing effort HEART: regular rate & rhythm, no lower extremity edema ABDOMEN:abdomen soft, non-tender and normal bowel sounds Musculoskeletal:no cyanosis of digits NEURO: alert & oriented x 3 with fluent speech, normal gait PAC without erythema  LABORATORY DATA:  I have reviewed the data as listed CBC Latest Ref Rng & Units 10/29/2018 10/22/2018 10/14/2018  WBC 4.0 - 10.5 K/uL 13.6(H) 13.1(H) 12.3(H)  Hemoglobin 12.0 - 15.0 g/dL 7.5(L) 8.0(L) 6.8(LL)  Hematocrit 36.0 - 46.0 % 26.1(L) 28.3(L) 24.3(L)  Platelets 150 - 400 K/uL 280 261 310     CMP Latest Ref Rng & Units 10/29/2018 10/22/2018 10/14/2018  Glucose 70 - 99 mg/dL 126(H) 167(H) 133(H)  BUN 6 - 20 mg/dL 9 10 11   Creatinine 0.44 - 1.00 mg/dL 1.06(H) 0.97 0.97  Sodium 135 - 145 mmol/L 141 143 140  Potassium 3.5 - 5.1 mmol/L 3.3(L) 3.8 3.3(L)  Chloride 98 - 111 mmol/L 106 110 109  CO2 22 - 32 mmol/L 25 24 23   Calcium 8.9 - 10.3 mg/dL 8.4(L) 8.1(L) 8.2(L)    Total Protein 6.5 - 8.1 g/dL 7.2 7.0 6.8  Total Bilirubin 0.3 - 1.2 mg/dL 0.2(L) 0.2(L) <0.2(L)  Alkaline Phos 38 - 126 U/L 62 72 59  AST 15 - 41 U/L 11(L) 12(L) 11(L)  ALT 0 - 44 U/L 6 7 6       RADIOGRAPHIC STUDIES: I have personally reviewed the radiological images as listed and agreed with the findings in the report. No results found.   ASSESSMENT & PLAN: MADIGAN ROSENSTEEL a 59 y.o.femalewith   1. 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 Reactive Leukocytosis and reactive thrombocytosis  4. 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 On Amicar. Followed by GI, Dr. Cristina Gong and [facility]   Dispo: Ms.  Isley appears stable. She completed another cycle of avastin. She tolerates treatment well overall. Bleeding has improved. In the interim she developed productive cough, chills, and sore throat. Afebrile, not hypoxic. Will obtain chest xray and r/o covid19 with testing on 7/17. She was given time/date and location instructions. Results will be called to Sandi Mealy, PA. Pending negative result, she will return for IV iron and 1 unit RBC tomorrow. Will reschedule avastin for next week when she recovers from respiratory illness. I prescribed her 1 week course of Augmentin, sent to her pharmacy. I encouraged her to call her GI doctor at [facility] and make f/u appt. She agrees.   Labs reviewed, Hg 7.5. iron studies are low, will treat aggressively with RBC this week and  weekly IV ferric gluconate. She will f/u next week to monitor her progress. I reviewed the plan with Dr. Burr Medico.     Orders Placed This Encounter  Procedures   DG Chest 2 View    Standing Status:   Future    Standing Expiration Date:   10/29/2019    Order Specific Question:   Reason for Exam (SYMPTOM  OR DIAGNOSIS REQUIRED)    Answer:   productive cough    Order Specific Question:   Is patient pregnant?    Answer:   No    Order Specific Question:   Preferred imaging location?    Answer:   [facility]    Order Specific Question:   Radiology Contrast Protocol - do NOT remove file path    Answer:   [email or web address]   Practitioner attestation of consent    I, the ordering practitioner, attest that I have discussed with the patient the benefits, risks, side effects, alternatives, likelihood of achieving goals and potential problems during recovery for the procedure listed.    Standing Status:   Future    Standing Expiration Date:   10/29/2019    Order Specific Question:   Procedure    Answer:   Blood Product(s)   Complete patient signature process for consent form    Standing Status:   Future    Standing Expiration Date:   10/29/2019   Care order/instruction    Transfuse Parameters    Standing Status:   Future    Standing Expiration Date:   10/29/2019   Type and screen    Standing Status:   Future    Standing Expiration Date:   10/29/2019   All questions were answered. The patient knows to call the clinic with any problems, questions or concerns. No barriers to learning was detected. I spent 20 minutes counseling the patient face to face. The total time spent in the appointment was 25 minutes and more than 50% was on counseling and review of test results     Alla Feeling, NP 10/29/18

## 2018-10-29 NOTE — Progress Notes (Signed)
New Symptoms-x 1 week-Cough productive of yellow phlegm 1 , "black stools this week,  " low grade fever , sore throat.Pt seeing Lacie.

## 2018-10-30 ENCOUNTER — Other Ambulatory Visit: Payer: Self-pay | Admitting: Medical

## 2018-10-30 ENCOUNTER — Telehealth: Payer: Self-pay | Admitting: Nurse Practitioner

## 2018-10-30 ENCOUNTER — Inpatient Hospital Stay: Payer: Medicaid Other

## 2018-10-30 ENCOUNTER — Other Ambulatory Visit: Payer: Self-pay

## 2018-10-30 ENCOUNTER — Other Ambulatory Visit (HOSPITAL_COMMUNITY)
Admission: RE | Admit: 2018-10-30 | Discharge: 2018-10-30 | Disposition: A | Discharge status: Home / Self Care | Payer: Medicaid Other | Source: Ambulatory Visit | Attending: Medical | Admitting: Medical

## 2018-10-30 ENCOUNTER — Ambulatory Visit (HOSPITAL_COMMUNITY)
Admission: RE | Admit: 2018-10-30 | Discharge: 2018-10-30 | Disposition: A | Discharge status: Home / Self Care | Payer: Medicaid Other | Source: Ambulatory Visit | Attending: Nurse Practitioner | Admitting: Nurse Practitioner

## 2018-10-30 ENCOUNTER — Inpatient Hospital Stay (HOSPITAL_BASED_OUTPATIENT_CLINIC_OR_DEPARTMENT_OTHER): Payer: Medicaid Other | Admitting: Medical

## 2018-10-30 DIAGNOSIS — D500 Iron deficiency anemia secondary to blood loss (chronic): Secondary | ICD-10-CM

## 2018-10-30 DIAGNOSIS — D649 Anemia, unspecified: Secondary | ICD-10-CM

## 2018-10-30 DIAGNOSIS — Z95828 Presence of other vascular implants and grafts: Secondary | ICD-10-CM

## 2018-10-30 DIAGNOSIS — R058 Other specified cough: Secondary | ICD-10-CM

## 2018-10-30 DIAGNOSIS — Z1159 Encounter for screening for other viral diseases: Secondary | ICD-10-CM | POA: Insufficient documentation

## 2018-10-30 DIAGNOSIS — K920 Hematemesis: Secondary | ICD-10-CM

## 2018-10-30 DIAGNOSIS — R05 Cough: Secondary | ICD-10-CM | POA: Diagnosis not present

## 2018-10-30 DIAGNOSIS — Z01818 Encounter for other preprocedural examination: Secondary | ICD-10-CM | POA: Diagnosis not present

## 2018-10-30 DIAGNOSIS — R040 Epistaxis: Secondary | ICD-10-CM

## 2018-10-30 DIAGNOSIS — R0602 Shortness of breath: Secondary | ICD-10-CM | POA: Diagnosis not present

## 2018-10-30 DIAGNOSIS — I780 Hereditary hemorrhagic telangiectasia: Secondary | ICD-10-CM

## 2018-10-30 LAB — PREPARE RBC (CROSSMATCH)

## 2018-10-30 LAB — SARS CORONAVIRUS 2 BY RT PCR (HOSPITAL ORDER, PERFORMED IN ~~LOC~~ HOSPITAL LAB): SARS Coronavirus 2: NEGATIVE

## 2018-10-30 MED ORDER — SODIUM CHLORIDE 0.9% IV SOLUTION
250.0000 mL | Freq: Once | INTRAVENOUS | Status: AC
  Administered 2018-10-30: 250 mL via INTRAVENOUS
  Filled 2018-10-30: qty 250

## 2018-10-30 MED ORDER — DIPHENHYDRAMINE HCL 50 MG/ML IJ SOLN: 25.0000 mg | Freq: Once | INTRAMUSCULAR | Status: DC

## 2018-10-30 MED ORDER — ACETAMINOPHEN 325 MG PO TABS
650.0000 mg | ORAL_TABLET | Freq: Once | ORAL | Status: AC
  Administered 2018-10-30: 14:00:00 650 mg via ORAL

## 2018-10-30 MED ORDER — DIPHENHYDRAMINE HCL 25 MG PO CAPS
ORAL_CAPSULE | ORAL | Status: AC
  Filled 2018-10-30: qty 1

## 2018-10-30 MED ORDER — ACETAMINOPHEN 325 MG PO TABS
ORAL_TABLET | ORAL | Status: AC
  Filled 2018-10-30: qty 2

## 2018-10-30 MED ORDER — SODIUM CHLORIDE 0.9% FLUSH
10.0000 mL | INTRAVENOUS | Status: AC | PRN
  Administered 2018-10-30: 16:00:00 10 mL
  Filled 2018-10-30: qty 10

## 2018-10-30 MED ORDER — SODIUM CHLORIDE 0.9 % IV SOLN
125.0000 mg | Freq: Once | INTRAVENOUS | Status: AC
  Administered 2018-10-30: 13:00:00 125 mg via INTRAVENOUS
  Filled 2018-10-30: qty 10

## 2018-10-30 MED ORDER — DIPHENHYDRAMINE HCL 25 MG PO CAPS
25.0000 mg | ORAL_CAPSULE | Freq: Once | ORAL | Status: AC
  Administered 2018-10-30: 25 mg via ORAL

## 2018-10-30 MED ORDER — SODIUM CHLORIDE 0.9 % IV SOLN
INTRAVENOUS | Status: DC
  Administered 2018-10-30: 13:00:00 via INTRAVENOUS
  Filled 2018-10-30: qty 250

## 2018-10-30 MED ORDER — HEPARIN SOD (PORK) LOCK FLUSH 100 UNIT/ML IV SOLN
500.0000 [IU] | Freq: Every day | INTRAVENOUS | Status: AC | PRN
  Administered 2018-10-30: 500 [IU]
  Filled 2018-10-30: qty 5

## 2018-10-30 NOTE — Patient Instructions (Signed)
Sodium Ferric Gluconate Complex injection What is this medicine? SODIUM FERRIC GLUCONATE COMPLEX (SOE dee um FER ik GLOO koe nate KOM pleks) is an iron replacement. It is used with epoetin therapy to treat low iron levels in patients who are receiving hemodialysis. This medicine may be used for other purposes; ask your health care provider or pharmacist if you have questions. COMMON BRAND NAME(S): Ferrlecit, Nulecit What should I tell my health care provider before I take this medicine? They need to know if you have any of the following conditions:  anemia that is not from iron deficiency  high levels of iron in the body  an unusual or allergic reaction to iron, benzyl alcohol, other medicines, foods, dyes, or preservatives  pregnant or are trying to become pregnant  breast-feeding How should I use this medicine? This medicine is for infusion into a vein. It is given by a health care professional in a hospital or clinic setting. Talk to your pediatrician regarding the use of this medicine in children. While this drug may be prescribed for children as young as 6 years old for selected conditions, precautions do apply. Overdosage: If you think you have taken too much of this medicine contact a poison control center or emergency room at once. NOTE: This medicine is only for you. Do not share this medicine with others. What if I miss a dose? It is important not to miss your dose. Call your doctor or health care professional if you are unable to keep an appointment. What may interact with this medicine? Do not take this medicine with any of the following medications:  deferoxamine  dimercaprol  other iron products This medicine may also interact with the following medications:  chloramphenicol  deferasirox  medicine for blood pressure like enalapril This list may not describe all possible interactions. Give your health care provider a list of all the medicines, herbs,  non-prescription drugs, or dietary supplements you use. Also tell them if you smoke, drink alcohol, or use illegal drugs. Some items may interact with your medicine. What should I watch for while using this medicine? Your condition will be monitored carefully while you are receiving this medicine. Visit your doctor for check-ups as directed. What side effects may I notice from receiving this medicine? Side effects that you should report to your doctor or health care professional as soon as possible:  allergic reactions like skin rash, itching or hives, swelling of the face, lips, or tongue  breathing problems  changes in hearing  changes in vision  chills, flushing, or sweating  fast, irregular heartbeat  feeling faint or lightheaded, falls  fever, flu-like symptoms  high or low blood pressure  pain, tingling, numbness in the hands or feet  severe pain in the chest, back, flanks, or groin  swelling of the ankles, feet, hands  trouble passing urine or change in the amount of urine  unusually weak or tired Side effects that usually do not require medical attention (report to your doctor or health care professional if they continue or are bothersome):  cramps  dark colored stools  diarrhea  headache  nausea, vomiting  stomach upset This list may not describe all possible side effects. Call your doctor for medical advice about side effects. You may report side effects to FDA at 1-800-FDA-1088. Where should I keep my medicine? This drug is given in a hospital or clinic and will not be stored at home. NOTE: This sheet is a summary. It may not cover all   possible information. If you have questions about this medicine, talk to your doctor, pharmacist, or health care provider.  2020 Elsevier/Gold Standard (2007-12-02 15:58:57)   Blood Transfusion, Adult, Care After This sheet gives you information about how to care for yourself after your procedure. Your doctor may also  give you more specific instructions. If you have problems or questions, contact your doctor. Follow these instructions at home:   Take over-the-counter and prescription medicines only as told by your doctor.  Go back to your normal activities as told by your doctor.  Follow instructions from your doctor about how to take care of the area where an IV tube was put into your vein (insertion site). Make sure you: ? Wash your hands with soap and water before you change your bandage (dressing). If there is no soap and water, use hand sanitizer. ? Change your bandage as told by your doctor.  Check your IV insertion site every day for signs of infection. Check for: ? More redness, swelling, or pain. ? More fluid or blood. ? Warmth. ? Pus or a bad smell. Contact a doctor if:  You have more redness, swelling, or pain around the IV insertion site.  You have more fluid or blood coming from the IV insertion site.  Your IV insertion site feels warm to the touch.  You have pus or a bad smell coming from the IV insertion site.  Your pee (urine) turns pink, red, or brown.  You feel weak after doing your normal activities. Get help right away if:  You have signs of a serious allergic or body defense (immune) system reaction, including: ? Itchiness. ? Hives. ? Trouble breathing. ? Anxiety. ? Pain in your chest or lower back. ? Fever, flushing, and chills. ? Fast pulse. ? Rash. ? Watery poop (diarrhea). ? Throwing up (vomiting). ? Dark pee. ? Serious headache. ? Dizziness. ? Stiff neck. ? Yellow color in your face or the white parts of your eyes (jaundice). Summary  After a blood transfusion, return to your normal activities as told by your doctor.  Every day, check for signs of infection where the IV tube was put into your vein.  Some signs of infection are warm skin, more redness and pain, more fluid or blood, and pus or a bad smell where the needle went in.  Contact your doctor  if you feel weak or have any unusual symptoms. This information is not intended to replace advice given to you by your health care provider. Make sure you discuss any questions you have with your health care provider. Document Released: 04/22/2014 Document Revised: 08/06/2017 Document Reviewed: 11/24/2015 Elsevier Patient Education  2020 [place] (COVID-19) Are you at risk?  Are you at risk for the Coronavirus (COVID-19)?  To be considered HIGH RISK for Coronavirus (COVID-19), you have to meet the following criteria:   Traveled to [place], [place], [place], [place] or [place]; or in the [place] to [place], [place], [place], or [place]; and have fever, cough, and shortness of breath within the last 2 weeks of travel OR  Been in close contact with a person diagnosed with COVID-19 within the last 2 weeks and have fever, cough, and shortness of breath  IF YOU DO NOT MEET THESE CRITERIA, YOU ARE CONSIDERED LOW RISK FOR COVID-19.  What to do if you are HIGH RISK for COVID-19?   If you are having a medical emergency, call 911.  Seek medical care right away. Before  you go to a doctors office, urgent care or emergency department, call ahead and tell them about your recent travel, contact with someone diagnosed with COVID-19, and your symptoms. You should receive instructions from your physicians office regarding next steps of care.   When you arrive at healthcare provider, tell the healthcare staff immediately you have returned from visiting [place], [place], [place], [place] or [place]; or traveled in the [place] to [place], [place], [place], or [place]; in the last two weeks or you have been in close contact with a person diagnosed with COVID-19 in the last 2 weeks.    Tell the health care staff about your symptoms: fever, cough and shortness of breath.  After you have been seen by a medical provider, you will be either: o Tested for (COVID-19) and  discharged home on quarantine except to seek medical care if symptoms worsen, and asked to  - Stay home and avoid contact with others until you get your results (4-5 days)  - Avoid travel on public transportation if possible (such as bus, train, or airplane) or o Sent to the Emergency Department by EMS for evaluation, COVID-19 testing, and possible admission depending on your condition and test results.  What to do if you are LOW RISK for COVID-19?  Reduce your risk of any infection by using the same precautions used for avoiding the common cold or flu:   Wash your hands often with soap and warm water for at least 20 seconds.  If soap and water are not readily available, use an alcohol-based hand sanitizer with at least 60% alcohol.   If coughing or sneezing, cover your mouth and nose by coughing or sneezing into the elbow areas of your shirt or coat, into a tissue or into your sleeve (not your hands).  Avoid shaking hands with others and consider head nods or verbal greetings only.  Avoid touching your eyes, nose, or mouth with unwashed hands.   Avoid close contact with people who are sick.  Avoid places or events with large numbers of people in one location, like concerts or sporting events.  Carefully consider travel plans you have or are making.  If you are planning any travel outside or inside the [place], visit the [place] webpage for the latest health notices.  If you have some symptoms but not all symptoms, continue to monitor at home and seek medical attention if your symptoms worsen.  If you are having a medical emergency, call 911.   [place] / e-Visit: [email or web address]         [facility]: [facility]: [phone]                   [facility]: [phone]

## 2018-10-30 NOTE — Progress Notes (Signed)
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 She presents to the clinic alone. She notes she feels better compared to last week. Her sore cough is gone but still has cough with yellow phlegm which is stable. She denies anymore fever. She denies any new body aches. She notes she does still smoke, now 1/2ppd. She denies abdominal pain. She notes her stool is back to dark brown from the black it was last week. She has note been able to go to [facility] yet. She notes she still has nightly nose bleeding. She denies any other obvious signs of bleeding.  She notes swelling and mild burning of left hand. She has had surgery on that hand in her past from carpal tunnel.   REVIEW OF SYSTEMS:   Constitutional: Denies fevers, chills or abnormal weight loss (+) fatigued  Eyes: Denies blurriness of vision Ears, nose, mouth, throat, and face: Denies mucositis or sore throat (+) nightly epistaxis Respiratory: Denies dyspnea or wheezes (+) cough with yellow phlegm Cardiovascular: Denies palpitation,  chest discomfort or lower extremity swelling (+) of left hand Gastrointestinal:  Denies nausea, heartburn or change in bowel habits Skin: Denies abnormal skin rashes Lymphatics: Denies new lymphadenopathy or easy bruising Neurological:Denies numbness, tingling or new weaknesses Behavioral/Psych: Mood is stable, no new changes  All other systems were reviewed with the patient and are negative.  MEDICAL HISTORY:  Past Medical History:  Diagnosis Date   Anxiety    Arthritis    knnes,back   GERD (gastroesophageal reflux disease)    Hereditary hemorrhagic telangiectasia (HCC)    History of swelling of feet    Hyperlipidemia    Hypertension    Major depressive disorder, recurrent episode ([place]) 06/05/2015   Obesity    Snores     SURGICAL HISTORY: Past Surgical History:  Procedure Laterality Date   ABDOMINAL HYSTERECTOMY     CARPAL TUNNEL RELEASE  05/13/2011   Procedure: CARPAL TUNNEL RELEASE;  Surgeon: Nita Sells, MD;  Location: [place];  Service: Orthopedics;  Laterality: Left;   COLONOSCOPY WITH PROPOFOL N/A 04/28/2014   Procedure: COLONOSCOPY WITH PROPOFOL;  Surgeon: Cleotis Nipper, MD;  Location: [facility] ENDOSCOPY;  Service: Endoscopy;  Laterality: N/A;   DG TOES*L*  2/10   rt   DILATION AND CURETTAGE OF UTERUS     ENTEROSCOPY N/A 10/17/2017   Procedure: ENTEROSCOPY;  Surgeon: Otis Brace, MD;  Location: [place];  Service: Gastroenterology;  Laterality: N/A;   ESOPHAGOGASTRODUODENOSCOPY N/A 04/10/2014   Procedure: ESOPHAGOGASTRODUODENOSCOPY (EGD);  Surgeon: [name], MD;  Location: [facility] ENDOSCOPY;  Service: Endoscopy;  Laterality: N/A;   ESOPHAGOGASTRODUODENOSCOPY N/A 05/10/2017   Procedure:  ESOPHAGOGASTRODUODENOSCOPY (EGD);  Surgeon: Ronald Lobo, MD;  Location: [facility] ENDOSCOPY;  Service: Endoscopy;  Laterality: N/A;   ESOPHAGOGASTRODUODENOSCOPY N/A 09/22/2017   Procedure: ESOPHAGOGASTRODUODENOSCOPY (EGD);  Surgeon:  Clarene Essex, MD;  Location: [place];  Service: Endoscopy;  Laterality: N/A;  bedside   ESOPHAGOGASTRODUODENOSCOPY (EGD) WITH PROPOFOL N/A 04/27/2014   Procedure: ESOPHAGOGASTRODUODENOSCOPY (EGD) WITH PROPOFOL;  Surgeon: Cleotis Nipper, MD;  Location: [place];  Service: Endoscopy;  Laterality: N/A;  possible apc   ESOPHAGOGASTRODUODENOSCOPY (EGD) WITH PROPOFOL N/A 09/30/2017   Procedure: ESOPHAGOGASTRODUODENOSCOPY (EGD) WITH PROPOFOL;  Surgeon: [name], MD;  Location: [place];  Service: Gastroenterology;  Laterality: N/A;   ESOPHAGOGASTRODUODENOSCOPY (EGD) WITH PROPOFOL N/A 10/01/2017   Procedure: ESOPHAGOGASTRODUODENOSCOPY (EGD) WITH PROPOFOL;  Surgeon: [name], MD;  Location: [place];  Service: Gastroenterology;  Laterality: N/A;   ESOPHAGOGASTRODUODENOSCOPY (EGD) WITH PROPOFOL N/A 10/08/2017   Procedure: ESOPHAGOGASTRODUODENOSCOPY (EGD) WITH PROPOFOL;  Surgeon: Otis Brace, MD;  Location: [place];  Service: Gastroenterology;  Laterality: N/A;   ESOPHAGOGASTRODUODENOSCOPY (EGD) WITH PROPOFOL N/A 10/17/2017   Procedure: ESOPHAGOGASTRODUODENOSCOPY (EGD) WITH PROPOFOL;  Surgeon: Otis Brace, MD;  Location: [place];  Service: Gastroenterology;  Laterality: N/A;   ESOPHAGOGASTRODUODENOSCOPY (EGD) WITH PROPOFOL N/A 10/19/2017   Procedure: ESOPHAGOGASTRODUODENOSCOPY (EGD) WITH PROPOFOL;  Surgeon: Otis Brace, MD;  Location: [place];  Service: Gastroenterology;  Laterality: N/A;   GIVENS CAPSULE STUDY N/A 10/02/2017   Procedure: GIVENS CAPSULE STUDY;  Surgeon: [name], MD;  Location: [place];  Service: Gastroenterology;  Laterality: N/A;   GIVENS CAPSULE STUDY N/A 10/08/2017   Procedure: GIVENS CAPSULE STUDY;  Surgeon: Otis Brace, MD;  Location: [place];  Service: Gastroenterology;  Laterality: N/A;  endoscopic placement of capsule   HOT HEMOSTASIS N/A 04/27/2014   Procedure: HOT HEMOSTASIS (ARGON PLASMA COAGULATION/BICAP);   Surgeon: Cleotis Nipper, MD;  Location: [facility] ENDOSCOPY;  Service: Endoscopy;  Laterality: N/A;   HOT HEMOSTASIS N/A 09/30/2017   Procedure: HOT HEMOSTASIS (ARGON PLASMA COAGULATION/BICAP);  Surgeon: [name], MD;  Location: [place];  Service: Gastroenterology;  Laterality: N/A;   HOT HEMOSTASIS N/A 10/01/2017   Procedure: HOT HEMOSTASIS (ARGON PLASMA COAGULATION/BICAP);  Surgeon: [name], MD;  Location: [place];  Service: Gastroenterology;  Laterality: N/A;   HOT HEMOSTASIS N/A 10/17/2017   Procedure: HOT HEMOSTASIS (ARGON PLASMA COAGULATION/BICAP);  Surgeon: Otis Brace, MD;  Location: [facility] ENDOSCOPY;  Service: Gastroenterology;  Laterality: N/A;   HOT HEMOSTASIS N/A 10/19/2017   Procedure: HOT HEMOSTASIS (ARGON PLASMA COAGULATION/BICAP);  Surgeon: Otis Brace, MD;  Location: [facility] ENDOSCOPY;  Service: Gastroenterology;  Laterality: N/A;   IR IMAGING GUIDED PORT INSERTION  07/08/2018   L shoulder Surgery  2011   SUBMUCOSAL INJECTION  09/22/2017   Procedure: SUBMUCOSAL INJECTION;  Surgeon: Clarene Essex, MD;  Location: [facility] ENDOSCOPY;  Service: Endoscopy;;    I have reviewed the social history and family history with the patient and they are unchanged from previous note.  ALLERGIES:  is allergic to feraheme [ferumoxytol]; nsaids; tomato; and wasp venom.  MEDICATIONS:  Current Outpatient Medications  Medication Sig Dispense Refill   albuterol (PROVENTIL HFA;VENTOLIN HFA) 108 (90 Base) MCG/ACT inhaler Inhale 1-2 puffs into the lungs every 6 (six) hours as needed for wheezing or shortness of breath. 1 Inhaler 3   ALPRAZolam (XANAX) 0.5 MG tablet TAKE 1 TABLET(0.5 MG) BY MOUTH TWICE DAILY AS NEEDED FOR ANXIETY 60 tablet 0   Aminocaproic Acid (AMICAR) 1000 MG TABS Take 1 tablet (1,000 mg total) by mouth 2 (two) times daily. 60 each 3  amLODipine (NORVASC) 10 MG tablet Take 1 tablet (10 mg total) by mouth daily. 90 tablet 0   atorvastatin (LIPITOR) 80 MG tablet TAKE 1  TABLET(80 MG) BY MOUTH DAILY 30 tablet 3   hydrochlorothiazide (HYDRODIURIL) 12.5 MG tablet TAKE 1 TABLET(12.5 MG) BY MOUTH DAILY 90 tablet 0   lidocaine-prilocaine (EMLA) cream Apply 1 application topically as needed. 30 g 0   pantoprazole (PROTONIX) 40 MG tablet Take 1 tablet (40 mg total) by mouth daily. 30 tablet 3   prochlorperazine (COMPAZINE) 10 MG tablet Take 1 tablet (10 mg total) by mouth every 6 (six) hours as needed for nausea or vomiting. 30 tablet 1   sertraline (ZOLOFT) 50 MG tablet TAKE 1 TABLET(50 MG) BY MOUTH DAILY 90 tablet 0   tamoxifen (NOLVADEX) 20 MG tablet Take 1 tablet (20 mg total) by mouth daily. 90 tablet 3   ferrous gluconate (FERGON) 324 MG tablet Take 1 tablet (324 mg total) by mouth 3 (three) times daily with meals. 90 tablet 0   No current facility-administered medications for this visit.    Facility-Administered Medications Ordered in Other Visits  Medication Dose Route Frequency Provider Last Rate Last Dose   0.9 %  sodium chloride infusion   Intravenous Once Truitt Merle, MD       ferric gluconate (NULECIT) 125 mg in sodium chloride 0.9 % 100 mL IVPB  125 mg Intravenous Once Truitt Merle, MD        PHYSICAL EXAMINATION: ECOG PERFORMANCE STATUS: 2 - Symptomatic, <50% confined to bed  Vitals:   11/05/18 1327  BP: 125/72  Pulse: 76  Resp: 18  Temp: 97.7 F (36.5 C)  SpO2: 100%   Filed Weights   11/05/18 1327  Weight: 269 lb 11.2 oz (122.3 kg)    GENERAL:alert, no distress and comfortable SKIN: skin color, texture, turgor are normal, no rashes or significant lesions EYES: normal, Conjunctiva are pink and non-injected, sclera clear  NECK: supple, thyroid normal size, non-tender, without nodularity LYMPH:  no palpable lymphadenopathy in the cervical, axillary  LUNGS: clear to auscultation and percussion with normal breathing effort HEART: regular rate & rhythm and no murmurs and no lower extremity edema ABDOMEN:abdomen soft, non-tender and  normal bowel sounds Musculoskeletal:no cyanosis of digits and no clubbing  NEURO: alert & oriented x 3 with fluent speech, no focal motor/sensory deficits  LABORATORY DATA:  I have reviewed the data as listed CBC Latest Ref Rng & Units 11/05/2018 10/29/2018 10/22/2018  WBC 4.0 - 10.5 K/uL 12.1(H) 13.6(H) 13.1(H)  Hemoglobin 12.0 - 15.0 g/dL 8.4(L) 7.5(L) 8.0(L)  Hematocrit 36.0 - 46.0 % 29.0(L) 26.1(L) 28.3(L)  Platelets 150 - 400 K/uL 337 280 261     CMP Latest Ref Rng & Units 11/05/2018 10/29/2018 10/22/2018  Glucose 70 - 99 mg/dL 137(H) 126(H) 167(H)  BUN 6 - 20 mg/dL 9 9 10   Creatinine 0.44 - 1.00 mg/dL 1.03(H) 1.06(H) 0.97  Sodium 135 - 145 mmol/L 142 141 143  Potassium 3.5 - 5.1 mmol/L 3.6 3.3(L) 3.8  Chloride 98 - 111 mmol/L 108 106 110  CO2 22 - 32 mmol/L 25 25 24   Calcium 8.9 - 10.3 mg/dL 9.0 8.4(L) 8.1(L)  Total Protein 6.5 - 8.1 g/dL 7.2 7.2 7.0  Total Bilirubin 0.3 - 1.2 mg/dL 0.3 0.2(L) 0.2(L)  Alkaline Phos 38 - 126 U/L 62 62 72  AST 15 - 41 U/L 12(L) 11(L) 12(L)  ALT 0 - 44 U/L 6 6 7       RADIOGRAPHIC STUDIES: I  have personally reviewed the radiological images as listed and agreed with the findings in the report. No results found.   ASSESSMENT & PLAN:  Peggy House is a 58 y.o. 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 Extensive workup found the pt to have HHT based on her personal and familyhistoryand recurrentAVM GI bleedings. -Sheis s/pIR embolization of the left Gastric and short gastric artery branchvesselswithout evidence of residual flow in the Dieulafoy lesionon 12/10/2017. -[facility]here[facility] and then every 2 weeks9/13/19-11/8/19 -She is currently onTamoxifenand Amicarfor HHT  -She hashad recurrent GI bleeding  and epistaxis again in December 2019.I have restartedIV iron,Avastinin 03/2018.She responded well  -Labs reviewed, CBC and CMP WNL except WBC 12.1, Hg 8.4, ANC 10.3, K3.3.  Iron panel is still pending. Overall adequate to proceed with IV Iron and Avastin today.  -Continue Tamoxifenand Amicar daily.IV ferric gluconateevery 1-2 weekunlessferritin >100, Avastin every 2 weeks.  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 -Shepreviouslynotedshe is no longer takingOral Fergon 1 tab TID -Hg stable at 8.4 today (11/05/18) Iron panel is still pending. Will give IV iron, Avastintoday -Last blood transfusion was 11/03/18.   3. Reactive Leukocytosis and reactive thrombocytosis  -She has mild leukocytosis with dominant neutrophils, likely reactive -Also has intermittent to moderate thrombocytosis, likely reactive to iron deficiency -continue monitoring -PLT normal and WBCincreased to 12.1 today (11/05/18)  4. 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 -I recommend she continue to follow up with her GI, Dr. Cristina Gong -She denies blood in stool this week. -Due to increasedEpistaxis, IpreviouslystartedheronAmicar1 g twice dailyon4/3/20  -She ran out of amicar and had 1 episode of significant epistaxis. I advised her to apply pressure to 1 nostril at a time during epistaxis. I also advised her to refill her Amicar at least 1 week for she runs out.  -She does still have nightly epistaxis. She did have black stool last week, now dark brown. I encouraged her to continue to monitor.    5. Acute bronchitis    -Symptoms presented about 2 weeks ago.  -COVID-19 testing and Chest Xray was negative. This is likely URI. She was given oral antibiotics last week  -This week she no longer has sore throat but does still have cough with yellow phlegm, afebrile.  -She notes she does still smoke, now 1/2ppd. I discussed smoking cessation as smoking can increase her risk of URI and can cause cancer and other health complications. She understands and notes she has tried to quit in the past.    PLAN: -Labs reviewed, Hg at 8.4. Will proceed with IV iron, Avastin today  -Continue Tamoxifenand Amicardaily -F/u with NP Lacie in 2 week  -F/u with me in 5 weeks    No problem-specific Assessment & Plan notes found for this encounter.   No orders of the defined types were placed in this encounter.  All questions were answered. The patient knows to call the clinic with any  problems, questions or concerns. No barriers to learning was detected. I spent 15 minutes counseling the patient face to face. The total time spent in the appointment was 20 minutes and more than 50% was on counseling and review of test results     Truitt Merle, MD 11/05/2018   I, Joslyn Devon, am acting as scribe for Truitt Merle, MD.   I have reviewed the above documentation for accuracy and completeness, and I agree with the above.

## 2018-10-30 NOTE — Telephone Encounter (Signed)
Scheduled appt per 7/16 los. ° °Spoke with patient and she is aware of her appt date and time. °

## 2018-10-31 LAB — TYPE AND SCREEN
ABO/RH(D): A NEG
Antibody Screen: NEGATIVE
Unit division: 0

## 2018-10-31 LAB — BPAM RBC
Blood Product Expiration Date: 202008012359
ISSUE DATE / TIME: 202007171428
Unit Type and Rh: 600

## 2018-11-02 ENCOUNTER — Telehealth: Payer: Self-pay

## 2018-11-02 DIAGNOSIS — M1990 Unspecified osteoarthritis, unspecified site: Secondary | ICD-10-CM | POA: Diagnosis not present

## 2018-11-02 NOTE — Telephone Encounter (Signed)
Spoke with patient to inform her chest x-ray was negative for infection.  She takes she still has a cough but not taking anything I recommended OTC Robitussin.  Also having some diarrhea, I recommended she get OTC Imodium, take two tablets right away and then one tablet with every diarrhea episode thereafter.  She is able to eat and drink.  She verbalized an understanding and was reminded of her upcoming appointments on Thursday 7/23 and times.

## 2018-11-02 NOTE — Telephone Encounter (Signed)
-----   Message from Alla Feeling, NP sent at 11/02/2018 10:48 AM EDT ----- Please let her know chest xray is negative for signs of infection. Please check on her status and remind her of upcoming appointments this week.  Thanks, Regan Rakers

## 2018-11-02 NOTE — Progress Notes (Signed)
[name] has been referred for COVID-19 testing earlier today prior to her infusion.  She was also sent for a chest x-ray.  Her COVID-19 test returned negative.  Her chest x-ray returned negative.  She continues on Augmentin twice daily.  She will proceed with her treatments today.  Sandi Mealy, MHS, PA-C Physician Assistant

## 2018-11-03 DIAGNOSIS — M1990 Unspecified osteoarthritis, unspecified site: Secondary | ICD-10-CM | POA: Diagnosis not present

## 2018-11-04 ENCOUNTER — Other Ambulatory Visit: Payer: Self-pay | Admitting: Nurse Practitioner

## 2018-11-04 ENCOUNTER — Other Ambulatory Visit: Payer: Self-pay

## 2018-11-04 DIAGNOSIS — D500 Iron deficiency anemia secondary to blood loss (chronic): Secondary | ICD-10-CM

## 2018-11-04 DIAGNOSIS — M1990 Unspecified osteoarthritis, unspecified site: Secondary | ICD-10-CM | POA: Diagnosis not present

## 2018-11-05 ENCOUNTER — Inpatient Hospital Stay: Payer: Medicaid Other

## 2018-11-05 ENCOUNTER — Other Ambulatory Visit: Payer: Medicaid Other

## 2018-11-05 ENCOUNTER — Encounter: Payer: Self-pay | Admitting: Hematology

## 2018-11-05 ENCOUNTER — Ambulatory Visit: Payer: Medicaid Other

## 2018-11-05 ENCOUNTER — Other Ambulatory Visit: Payer: Self-pay

## 2018-11-05 ENCOUNTER — Inpatient Hospital Stay (HOSPITAL_BASED_OUTPATIENT_CLINIC_OR_DEPARTMENT_OTHER): Payer: Medicaid Other | Admitting: Hematology

## 2018-11-05 VITALS — BP 125/72 | HR 76 | Temp 97.7°F | Resp 18 | Ht 65.0 in | Wt 269.7 lb

## 2018-11-05 VITALS — BP 136/67

## 2018-11-05 DIAGNOSIS — D500 Iron deficiency anemia secondary to blood loss (chronic): Secondary | ICD-10-CM

## 2018-11-05 DIAGNOSIS — Z95828 Presence of other vascular implants and grafts: Secondary | ICD-10-CM

## 2018-11-05 DIAGNOSIS — I10 Essential (primary) hypertension: Secondary | ICD-10-CM

## 2018-11-05 DIAGNOSIS — I780 Hereditary hemorrhagic telangiectasia: Secondary | ICD-10-CM | POA: Diagnosis not present

## 2018-11-05 DIAGNOSIS — M1990 Unspecified osteoarthritis, unspecified site: Secondary | ICD-10-CM | POA: Diagnosis not present

## 2018-11-05 LAB — CMP (CANCER CENTER ONLY)
ALT: <6 U/L (ref 0–44)
AST: 12 U/L — ABNORMAL LOW (ref 15–41)
Albumin: 3.3 g/dL — ABNORMAL LOW (ref 3.5–5.0)
Alkaline Phosphatase: 62 U/L (ref 38–126)
Anion gap: 9 (ref 5–15)
BUN: 9 mg/dL (ref 6–20)
CO2: 25 mmol/L (ref 22–32)
Calcium: 9 mg/dL (ref 8.9–10.3)
Chloride: 108 mmol/L (ref 98–111)
Creatinine: 1.03 mg/dL — ABNORMAL HIGH (ref 0.44–1.00)
GFR, Est AFR Am: >60 mL/min (ref >60)
GFR, Estimated: >60 mL/min (ref >60)
Glucose, Bld: 137 mg/dL — ABNORMAL HIGH (ref 70–99)
Potassium: 3.6 mmol/L (ref 3.5–5.1)
Sodium: 142 mmol/L (ref 135–145)
Total Bilirubin: 0.3 mg/dL (ref 0.3–1.2)
Total Protein: 7.2 g/dL (ref 6.5–8.1)

## 2018-11-05 LAB — RETICULOCYTES
Immature Retic Fract: 27.3 % — ABNORMAL HIGH (ref 2.3–15.9)
RBC.: 3.48 MIL/uL — ABNORMAL LOW (ref 3.87–5.11)
Retic Count, Absolute: 80 10*3/uL (ref 19.0–186.0)
Retic Ct Pct: 2.3 % (ref 0.4–3.1)

## 2018-11-05 LAB — CBC WITH DIFFERENTIAL (CANCER CENTER ONLY)
Abs Immature Granulocytes: 0.04 10*3/uL (ref 0.00–0.07)
Basophils Absolute: 0 10*3/uL (ref 0.0–0.1)
Basophils Relative: 0 %
Eosinophils Absolute: 0.1 10*3/uL (ref 0.0–0.5)
Eosinophils Relative: 1 %
HCT: 29 % — ABNORMAL LOW (ref 36.0–46.0)
Hemoglobin: 8.4 g/dL — ABNORMAL LOW (ref 12.0–15.0)
Immature Granulocytes: 0 %
Lymphocytes Relative: 10 %
Lymphs Abs: 1.3 10*3/uL (ref 0.7–4.0)
MCH: 24.1 pg — ABNORMAL LOW (ref 26.0–34.0)
MCHC: 29 g/dL — ABNORMAL LOW (ref 30.0–36.0)
MCV: 83.3 fL (ref 80.0–100.0)
Monocytes Absolute: 0.4 10*3/uL (ref 0.1–1.0)
Monocytes Relative: 3 %
Neutro Abs: 10.3 10*3/uL — ABNORMAL HIGH (ref 1.7–7.7)
Neutrophils Relative %: 86 %
Platelet Count: 337 10*3/uL (ref 150–400)
RBC: 3.48 MIL/uL — ABNORMAL LOW (ref 3.87–5.11)
RDW: 20.9 % — ABNORMAL HIGH (ref 11.5–15.5)
WBC Count: 12.1 10*3/uL — ABNORMAL HIGH (ref 4.0–10.5)
nRBC: 0 % (ref 0.0–0.2)

## 2018-11-05 LAB — IRON AND TIBC
Iron: 23 ug/dL — ABNORMAL LOW (ref 41–142)
Saturation Ratios: 9 % — ABNORMAL LOW (ref 21–57)
TIBC: 253 ug/dL (ref 236–444)
UIBC: 229 ug/dL (ref 120–384)

## 2018-11-05 LAB — TOTAL PROTEIN, URINE DIPSTICK: Protein, ur: <30 mg/dL — AB

## 2018-11-05 LAB — FERRITIN: Ferritin: 126 ng/mL (ref 11–307)

## 2018-11-05 MED ORDER — SODIUM CHLORIDE 0.9 % IV SOLN
5.3000 mg/kg | Freq: Once | INTRAVENOUS | Status: AC
  Administered 2018-11-05: 600 mg via INTRAVENOUS
  Filled 2018-11-05: qty 16

## 2018-11-05 MED ORDER — SODIUM CHLORIDE 0.9% FLUSH
10.0000 mL | INTRAVENOUS | Status: DC | PRN
  Administered 2018-11-05: 17:00:00 10 mL
  Filled 2018-11-05: qty 10

## 2018-11-05 MED ORDER — SODIUM CHLORIDE 0.9 % IV SOLN
125.0000 mg | Freq: Once | INTRAVENOUS | Status: AC
  Administered 2018-11-05: 16:00:00 125 mg via INTRAVENOUS
  Filled 2018-11-05: qty 10

## 2018-11-05 MED ORDER — SODIUM CHLORIDE 0.9 % IV SOLN
Freq: Once | INTRAVENOUS | Status: AC
  Administered 2018-11-05: 16:00:00 via INTRAVENOUS
  Filled 2018-11-05: qty 250

## 2018-11-05 MED ORDER — HEPARIN SOD (PORK) LOCK FLUSH 100 UNIT/ML IV SOLN
500.0000 [IU] | Freq: Once | INTRAVENOUS | Status: AC | PRN
  Administered 2018-11-05: 500 [IU]
  Filled 2018-11-05: qty 5

## 2018-11-05 NOTE — Progress Notes (Signed)
Patient is to receive Avastin and ferric gluconate today, 11/05/18 per Dr. Burr Medico.   Peggy House, PharmD, [facility] Pharmacist Pharmacy Phone: [phone] 11/05/2018

## 2018-11-05 NOTE — Patient Instructions (Signed)
Sodium Ferric Gluconate Complex injection What is this medicine? SODIUM FERRIC GLUCONATE COMPLEX (SOE dee um FER ik GLOO koe nate KOM pleks) is an iron replacement. It is used with epoetin therapy to treat low iron levels in patients who are receiving hemodialysis. This medicine may be used for other purposes; ask your health care provider or pharmacist if you have questions. COMMON BRAND NAME(S): Ferrlecit, Nulecit What should I tell my health care provider before I take this medicine? They need to know if you have any of the following conditions:  anemia that is not from iron deficiency  high levels of iron in the body  an unusual or allergic reaction to iron, benzyl alcohol, other medicines, foods, dyes, or preservatives  pregnant or are trying to become pregnant  breast-feeding How should I use this medicine? This medicine is for infusion into a vein. It is given by a health care professional in a hospital or clinic setting. Talk to your pediatrician regarding the use of this medicine in children. While this drug may be prescribed for children as young as 6 years old for selected conditions, precautions do apply. Overdosage: If you think you have taken too much of this medicine contact a poison control center or emergency room at once. NOTE: This medicine is only for you. Do not share this medicine with others. What if I miss a dose? It is important not to miss your dose. Call your doctor or health care professional if you are unable to keep an appointment. What may interact with this medicine? Do not take this medicine with any of the following medications:  deferoxamine  dimercaprol  other iron products This medicine may also interact with the following medications:  chloramphenicol  deferasirox  medicine for blood pressure like enalapril This list may not describe all possible interactions. Give your health care provider a list of all the medicines, herbs,  non-prescription drugs, or dietary supplements you use. Also tell them if you smoke, drink alcohol, or use illegal drugs. Some items may interact with your medicine. What should I watch for while using this medicine? Your condition will be monitored carefully while you are receiving this medicine. Visit your doctor for check-ups as directed. What side effects may I notice from receiving this medicine? Side effects that you should report to your doctor or health care professional as soon as possible:  allergic reactions like skin rash, itching or hives, swelling of the face, lips, or tongue  breathing problems  changes in hearing  changes in vision  chills, flushing, or sweating  fast, irregular heartbeat  feeling faint or lightheaded, falls  fever, flu-like symptoms  high or low blood pressure  pain, tingling, numbness in the hands or feet  severe pain in the chest, back, flanks, or groin  swelling of the ankles, feet, hands  trouble passing urine or change in the amount of urine  unusually weak or tired Side effects that usually do not require medical attention (report to your doctor or health care professional if they continue or are bothersome):  cramps  dark colored stools  diarrhea  headache  nausea, vomiting  stomach upset This list may not describe all possible side effects. Call your doctor for medical advice about side effects. You may report side effects to FDA at 1-800-FDA-1088. Where should I keep my medicine? This drug is given in a hospital or clinic and will not be stored at home. NOTE: This sheet is a summary. It may not cover all   possible information. If you have questions about this medicine, talk to your doctor, pharmacist, or health care provider.  2020 Elsevier/Gold Standard (2007-12-02 15:58:57)  Bevacizumab injection What is this medicine? BEVACIZUMAB (be va SIZ yoo mab) is a monoclonal antibody. It is used to treat many types of  cancer. This medicine may be used for other purposes; ask your health care provider or pharmacist if you have questions. COMMON BRAND NAME(S): Avastin, MVASI, Zirabev What should I tell my health care provider before I take this medicine? They need to know if you have any of these conditions:  diabetes  heart disease  high blood pressure  history of coughing up blood  prior anthracycline chemotherapy (e.g., doxorubicin, daunorubicin, epirubicin)  recent or ongoing radiation therapy  recent or planning to have surgery  stroke  an unusual or allergic reaction to bevacizumab, hamster proteins, mouse proteins, other medicines, foods, dyes, or preservatives  pregnant or trying to get pregnant  breast-feeding How should I use this medicine? This medicine is for infusion into a vein. It is given by a health care professional in a hospital or clinic setting. Talk to your pediatrician regarding the use of this medicine in children. Special care may be needed. Overdosage: If you think you have taken too much of this medicine contact a poison control center or emergency room at once. NOTE: This medicine is only for you. Do not share this medicine with others. What if I miss a dose? It is important not to miss your dose. Call your doctor or health care professional if you are unable to keep an appointment. What may interact with this medicine? Interactions are not expected. This list may not describe all possible interactions. Give your health care provider a list of all the medicines, herbs, non-prescription drugs, or dietary supplements you use. Also tell them if you smoke, drink alcohol, or use illegal drugs. Some items may interact with your medicine. What should I watch for while using this medicine? Your condition will be monitored carefully while you are receiving this medicine. You will need important blood work and urine testing done while you are taking this medicine. This  medicine may increase your risk to bruise or bleed. Call your doctor or health care professional if you notice any unusual bleeding. This medicine should be started at least 28 days following major surgery and the site of the surgery should be totally healed. Check with your doctor before scheduling dental work or surgery while you are receiving this treatment. Talk to your doctor if you have recently had surgery or if you have a wound that has not healed. Do not become pregnant while taking this medicine or for 6 months after stopping it. Women should inform their doctor if they wish to become pregnant or think they might be pregnant. There is a potential for serious side effects to an unborn child. Talk to your health care professional or pharmacist for more information. Do not breast-feed an infant while taking this medicine and for 6 months after the last dose. This medicine has caused ovarian failure in some women. This medicine may interfere with the ability to have a child. You should talk to your doctor or health care professional if you are concerned about your fertility. What side effects may I notice from receiving this medicine? Side effects that you should report to your doctor or health care professional as soon as possible:  allergic reactions like skin rash, itching or hives, swelling of the face, lips,  or tongue  chest pain or chest tightness  chills  coughing up blood  high fever  seizures  severe constipation  signs and symptoms of bleeding such as bloody or black, tarry stools; red or dark-brown urine; spitting up blood or brown material that looks like coffee grounds; red spots on the skin; unusual bruising or bleeding from the eye, gums, or nose  signs and symptoms of a blood clot such as breathing problems; chest pain; severe, sudden headache; pain, swelling, warmth in the leg  signs and symptoms of a stroke like changes in vision; confusion; trouble speaking or  understanding; severe headaches; sudden numbness or weakness of the face, arm or leg; trouble walking; dizziness; loss of balance or coordination  stomach pain  sweating  swelling of legs or ankles  vomiting  weight gain Side effects that usually do not require medical attention (report to your doctor or health care professional if they continue or are bothersome):  back pain  changes in taste  decreased appetite  dry skin  nausea  tiredness This list may not describe all possible side effects. Call your doctor for medical advice about side effects. You may report side effects to FDA at 1-800-FDA-1088. Where should I keep my medicine? This drug is given in a hospital or clinic and will not be stored at home. NOTE: This sheet is a summary. It may not cover all possible information. If you have questions about this medicine, talk to your doctor, pharmacist, or health care provider.  2020 Elsevier/Gold Standard (2016-03-29 14:33:29)  Coronavirus (COVID-19) Are you at risk?  Are you at risk for the Coronavirus (COVID-19)?  To be considered HIGH RISK for Coronavirus (COVID-19), you have to meet the following criteria:  . Traveled to [place], [place], [place], [place] or [place]; or in the [place] to [place], [place], [place], or [place]; and have fever, cough, and shortness of breath within the last 2 weeks of travel OR . Been in close contact with a person diagnosed with COVID-19 within the last 2 weeks and have fever, cough, and shortness of breath . IF YOU DO NOT MEET THESE CRITERIA, YOU ARE CONSIDERED LOW RISK FOR COVID-19.  What to do if you are HIGH RISK for COVID-19?  [name] If you are having a medical emergency, call 911. . Seek medical care right away. Before you go to a doctor's office, urgent care or emergency department, call ahead and tell them about your recent travel, contact with someone diagnosed with COVID-19, and your symptoms. You should receive  instructions from your physician's office regarding next steps of care.  . When you arrive at healthcare provider, tell the healthcare staff immediately you have returned from visiting [place], [place], [place], [place] or [place]; or traveled in the [place] to [place], [place], [place], or [place]; in the last two weeks or you have been in close contact with a person diagnosed with COVID-19 in the last 2 weeks.   . Tell the health care staff about your symptoms: fever, cough and shortness of breath. . After you have been seen by a medical provider, you will be either: o Tested for (COVID-19) and discharged home on quarantine except to seek medical care if symptoms worsen, and asked to  - Stay home and avoid contact with others until you get your results (4-5 days)  - Avoid travel on public transportation if possible (such as bus, train, or airplane) or o Sent to the Emergency  Department by EMS for evaluation, COVID-19 testing, and possible admission depending on your condition and test results.  What to do if you are LOW RISK for COVID-19?  Reduce your risk of any infection by using the same precautions used for avoiding the common cold or flu:  [name] Wash your hands often with soap and warm water for at least 20 seconds.  If soap and water are not readily available, use an alcohol-based hand sanitizer with at least 60% alcohol.  . If coughing or sneezing, cover your mouth and nose by coughing or sneezing into the elbow areas of your shirt or coat, into a tissue or into your sleeve (not your hands). . Avoid shaking hands with others and consider head nods or verbal greetings only. . Avoid touching your eyes, nose, or mouth with unwashed hands.  . Avoid close contact with people who are sick. . Avoid places or events with large numbers of people in one location, like concerts or sporting events. . Carefully consider travel plans you have or are making. . If you are planning any travel  outside or inside the [place], visit the CDC's Travelers' Health webpage for the latest health notices. . If you have some symptoms but not all symptoms, continue to monitor at home and seek medical attention if your symptoms worsen. . If you are having a medical emergency, call 911.   [place] / e-Visit: [email or web address]         [facility]: [facility]: [phone]                   [facility]: [phone]

## 2018-11-06 ENCOUNTER — Telehealth: Payer: Self-pay | Admitting: Hematology

## 2018-11-06 DIAGNOSIS — M1990 Unspecified osteoarthritis, unspecified site: Secondary | ICD-10-CM | POA: Diagnosis not present

## 2018-11-06 NOTE — Telephone Encounter (Signed)
Scheduled appt per 7/23 los.  Will ask lacie on 7/27 can I use her new patient appt for 8/6 for a f/u for this patient.

## 2018-11-09 DIAGNOSIS — M1990 Unspecified osteoarthritis, unspecified site: Secondary | ICD-10-CM | POA: Diagnosis not present

## 2018-11-10 DIAGNOSIS — M1990 Unspecified osteoarthritis, unspecified site: Secondary | ICD-10-CM | POA: Diagnosis not present

## 2018-11-11 DIAGNOSIS — M1990 Unspecified osteoarthritis, unspecified site: Secondary | ICD-10-CM | POA: Diagnosis not present

## 2018-11-12 ENCOUNTER — Inpatient Hospital Stay: Payer: Medicaid Other

## 2018-11-12 ENCOUNTER — Other Ambulatory Visit: Payer: Self-pay

## 2018-11-12 ENCOUNTER — Ambulatory Visit: Payer: Medicaid Other | Admitting: Nurse Practitioner

## 2018-11-12 VITALS — BP 139/65 | HR 65 | Temp 98.8°F | Resp 18

## 2018-11-12 DIAGNOSIS — Z95828 Presence of other vascular implants and grafts: Secondary | ICD-10-CM

## 2018-11-12 DIAGNOSIS — D500 Iron deficiency anemia secondary to blood loss (chronic): Secondary | ICD-10-CM

## 2018-11-12 DIAGNOSIS — M1990 Unspecified osteoarthritis, unspecified site: Secondary | ICD-10-CM | POA: Diagnosis not present

## 2018-11-12 LAB — CBC WITH DIFFERENTIAL (CANCER CENTER ONLY)
Abs Immature Granulocytes: 0.05 10*3/uL (ref 0.00–0.07)
Basophils Absolute: 0 10*3/uL (ref 0.0–0.1)
Basophils Relative: 0 %
Eosinophils Absolute: 0.1 10*3/uL (ref 0.0–0.5)
Eosinophils Relative: 1 %
HCT: 26.1 % — ABNORMAL LOW (ref 36.0–46.0)
Hemoglobin: 7.3 g/dL — ABNORMAL LOW (ref 12.0–15.0)
Immature Granulocytes: 1 %
Lymphocytes Relative: 13 %
Lymphs Abs: 1.4 10*3/uL (ref 0.7–4.0)
MCH: 24.1 pg — ABNORMAL LOW (ref 26.0–34.0)
MCHC: 28 g/dL — ABNORMAL LOW (ref 30.0–36.0)
MCV: 86.1 fL (ref 80.0–100.0)
Monocytes Absolute: 0.5 10*3/uL (ref 0.1–1.0)
Monocytes Relative: 4 %
Neutro Abs: 8.6 10*3/uL — ABNORMAL HIGH (ref 1.7–7.7)
Neutrophils Relative %: 81 %
Platelet Count: 258 10*3/uL (ref 150–400)
RBC: 3.03 MIL/uL — ABNORMAL LOW (ref 3.87–5.11)
RDW: 21.4 % — ABNORMAL HIGH (ref 11.5–15.5)
WBC Count: 10.7 10*3/uL — ABNORMAL HIGH (ref 4.0–10.5)
nRBC: 0 % (ref 0.0–0.2)

## 2018-11-12 LAB — RETICULOCYTES
Immature Retic Fract: 31.5 % — ABNORMAL HIGH (ref 2.3–15.9)
RBC.: 3.04 MIL/uL — ABNORMAL LOW (ref 3.87–5.11)
Retic Count, Absolute: 108.2 10*3/uL (ref 19.0–186.0)
Retic Ct Pct: 3.6 % — ABNORMAL HIGH (ref 0.4–3.1)

## 2018-11-12 LAB — PREPARE RBC (CROSSMATCH)

## 2018-11-12 MED ORDER — SODIUM CHLORIDE 0.9% FLUSH
10.0000 mL | Freq: Once | INTRAVENOUS | Status: AC
  Administered 2018-11-12: 10 mL
  Filled 2018-11-12: qty 10

## 2018-11-12 MED ORDER — SODIUM CHLORIDE 0.9 % IV SOLN
125.0000 mg | Freq: Once | INTRAVENOUS | Status: AC
  Administered 2018-11-12: 125 mg via INTRAVENOUS
  Filled 2018-11-12: qty 10

## 2018-11-12 MED ORDER — ACETAMINOPHEN 325 MG PO TABS
650.0000 mg | ORAL_TABLET | Freq: Once | ORAL | Status: AC
  Administered 2018-11-12: 650 mg via ORAL

## 2018-11-12 MED ORDER — ACETAMINOPHEN 325 MG PO TABS
ORAL_TABLET | ORAL | Status: AC
  Filled 2018-11-12: qty 2

## 2018-11-12 MED ORDER — SODIUM CHLORIDE 0.9 % IV SOLN
Freq: Once | INTRAVENOUS | Status: AC
  Administered 2018-11-12: 14:00:00 via INTRAVENOUS
  Filled 2018-11-12: qty 250

## 2018-11-12 MED ORDER — SODIUM CHLORIDE 0.9% FLUSH
10.0000 mL | Freq: Once | INTRAVENOUS | Status: AC
  Administered 2018-11-12: 14:00:00 10 mL
  Filled 2018-11-12: qty 10

## 2018-11-12 MED ORDER — HEPARIN SOD (PORK) LOCK FLUSH 100 UNIT/ML IV SOLN
500.0000 [IU] | Freq: Once | INTRAVENOUS | Status: AC
  Administered 2018-11-12: 500 [IU]
  Filled 2018-11-12: qty 5

## 2018-11-12 MED ORDER — DIPHENHYDRAMINE HCL 25 MG PO CAPS
ORAL_CAPSULE | ORAL | Status: AC
  Filled 2018-11-12: qty 1

## 2018-11-12 MED ORDER — SODIUM CHLORIDE 0.9% IV SOLUTION
250.0000 mL | Freq: Once | INTRAVENOUS | Status: DC
  Filled 2018-11-12: qty 250

## 2018-11-12 MED ORDER — DIPHENHYDRAMINE HCL 25 MG PO CAPS
25.0000 mg | ORAL_CAPSULE | Freq: Once | ORAL | Status: AC
  Administered 2018-11-12: 25 mg via ORAL

## 2018-11-12 NOTE — Patient Instructions (Signed)
Sodium Ferric Gluconate Complex injection What is this medicine? SODIUM FERRIC GLUCONATE COMPLEX (SOE dee um FER ik GLOO koe nate KOM pleks) is an iron replacement. It is used with epoetin therapy to treat low iron levels in patients who are receiving hemodialysis. This medicine may be used for other purposes; ask your health care provider or pharmacist if you have questions. COMMON BRAND NAME(S): Ferrlecit, Nulecit What should I tell my health care provider before I take this medicine? They need to know if you have any of the following conditions:  anemia that is not from iron deficiency  high levels of iron in the body  an unusual or allergic reaction to iron, benzyl alcohol, other medicines, foods, dyes, or preservatives  pregnant or are trying to become pregnant  breast-feeding How should I use this medicine? This medicine is for infusion into a vein. It is given by a health care professional in a hospital or clinic setting. Talk to your pediatrician regarding the use of this medicine in children. While this drug may be prescribed for children as young as 6 years old for selected conditions, precautions do apply. Overdosage: If you think you have taken too much of this medicine contact a poison control center or emergency room at once. NOTE: This medicine is only for you. Do not share this medicine with others. What if I miss a dose? It is important not to miss your dose. Call your doctor or health care professional if you are unable to keep an appointment. What may interact with this medicine? Do not take this medicine with any of the following medications:  deferoxamine  dimercaprol  other iron products This medicine may also interact with the following medications:  chloramphenicol  deferasirox  medicine for blood pressure like enalapril This list may not describe all possible interactions. Give your health care provider a list of all the medicines, herbs,  non-prescription drugs, or dietary supplements you use. Also tell them if you smoke, drink alcohol, or use illegal drugs. Some items may interact with your medicine. What should I watch for while using this medicine? Your condition will be monitored carefully while you are receiving this medicine. Visit your doctor for check-ups as directed. What side effects may I notice from receiving this medicine? Side effects that you should report to your doctor or health care professional as soon as possible:  allergic reactions like skin rash, itching or hives, swelling of the face, lips, or tongue  breathing problems  changes in hearing  changes in vision  chills, flushing, or sweating  fast, irregular heartbeat  feeling faint or lightheaded, falls  fever, flu-like symptoms  high or low blood pressure  pain, tingling, numbness in the hands or feet  severe pain in the chest, back, flanks, or groin  swelling of the ankles, feet, hands  trouble passing urine or change in the amount of urine  unusually weak or tired Side effects that usually do not require medical attention (report to your doctor or health care professional if they continue or are bothersome):  cramps  dark colored stools  diarrhea  headache  nausea, vomiting  stomach upset This list may not describe all possible side effects. Call your doctor for medical advice about side effects. You may report side effects to FDA at 1-800-FDA-1088. Where should I keep my medicine? This drug is given in a hospital or clinic and will not be stored at home. NOTE: This sheet is a summary. It may not cover all   possible information. If you have questions about this medicine, talk to your doctor, pharmacist, or health care provider.  2020 Elsevier/Gold Standard (2007-12-02 15:58:57)  

## 2018-11-13 DIAGNOSIS — M1990 Unspecified osteoarthritis, unspecified site: Secondary | ICD-10-CM | POA: Diagnosis not present

## 2018-11-13 LAB — TYPE AND SCREEN
ABO/RH(D): A NEG
Antibody Screen: NEGATIVE
Unit division: 0

## 2018-11-13 LAB — BPAM RBC
Blood Product Expiration Date: 202008252359
ISSUE DATE / TIME: 202007301524
Unit Type and Rh: 600

## 2018-11-13 LAB — SAMPLE TO BLOOD BANK

## 2018-11-16 DIAGNOSIS — M1990 Unspecified osteoarthritis, unspecified site: Secondary | ICD-10-CM | POA: Diagnosis not present

## 2018-11-17 DIAGNOSIS — M1990 Unspecified osteoarthritis, unspecified site: Secondary | ICD-10-CM | POA: Diagnosis not present

## 2018-11-18 DIAGNOSIS — M1990 Unspecified osteoarthritis, unspecified site: Secondary | ICD-10-CM | POA: Diagnosis not present

## 2018-11-19 ENCOUNTER — Encounter: Payer: Self-pay | Admitting: Nurse Practitioner

## 2018-11-19 ENCOUNTER — Telehealth: Payer: Self-pay | Admitting: *Deleted

## 2018-11-19 ENCOUNTER — Other Ambulatory Visit: Payer: Self-pay

## 2018-11-19 ENCOUNTER — Inpatient Hospital Stay: Payer: Medicaid Other | Attending: Hematology

## 2018-11-19 ENCOUNTER — Inpatient Hospital Stay (HOSPITAL_BASED_OUTPATIENT_CLINIC_OR_DEPARTMENT_OTHER): Payer: Medicaid Other | Admitting: Nurse Practitioner

## 2018-11-19 ENCOUNTER — Inpatient Hospital Stay: Payer: Medicaid Other

## 2018-11-19 VITALS — BP 147/73 | HR 78 | Temp 98.1°F | Resp 20 | Wt 280.5 lb

## 2018-11-19 DIAGNOSIS — Z5112 Encounter for antineoplastic immunotherapy: Secondary | ICD-10-CM | POA: Diagnosis present

## 2018-11-19 DIAGNOSIS — D500 Iron deficiency anemia secondary to blood loss (chronic): Secondary | ICD-10-CM

## 2018-11-19 DIAGNOSIS — I780 Hereditary hemorrhagic telangiectasia: Secondary | ICD-10-CM | POA: Insufficient documentation

## 2018-11-19 DIAGNOSIS — K922 Gastrointestinal hemorrhage, unspecified: Secondary | ICD-10-CM | POA: Insufficient documentation

## 2018-11-19 DIAGNOSIS — Z95828 Presence of other vascular implants and grafts: Secondary | ICD-10-CM

## 2018-11-19 DIAGNOSIS — E669 Obesity, unspecified: Secondary | ICD-10-CM | POA: Insufficient documentation

## 2018-11-19 DIAGNOSIS — E876 Hypokalemia: Secondary | ICD-10-CM | POA: Diagnosis not present

## 2018-11-19 DIAGNOSIS — R040 Epistaxis: Secondary | ICD-10-CM | POA: Diagnosis not present

## 2018-11-19 DIAGNOSIS — Z79899 Other long term (current) drug therapy: Secondary | ICD-10-CM | POA: Diagnosis not present

## 2018-11-19 DIAGNOSIS — D649 Anemia, unspecified: Secondary | ICD-10-CM | POA: Diagnosis not present

## 2018-11-19 DIAGNOSIS — I10 Essential (primary) hypertension: Secondary | ICD-10-CM | POA: Insufficient documentation

## 2018-11-19 DIAGNOSIS — F17210 Nicotine dependence, cigarettes, uncomplicated: Secondary | ICD-10-CM | POA: Diagnosis not present

## 2018-11-19 DIAGNOSIS — M1990 Unspecified osteoarthritis, unspecified site: Secondary | ICD-10-CM | POA: Diagnosis not present

## 2018-11-19 LAB — CBC WITH DIFFERENTIAL (CANCER CENTER ONLY)
Abs Immature Granulocytes: 0.22 10*3/uL — ABNORMAL HIGH (ref 0.00–0.07)
Basophils Absolute: 0 10*3/uL (ref 0.0–0.1)
Basophils Relative: 0 %
Eosinophils Absolute: 0.1 10*3/uL (ref 0.0–0.5)
Eosinophils Relative: 1 %
HCT: 21.5 % — ABNORMAL LOW (ref 36.0–46.0)
Hemoglobin: 6 g/dL — CL (ref 12.0–15.0)
Immature Granulocytes: 2 %
Lymphocytes Relative: 12 %
Lymphs Abs: 1.3 10*3/uL (ref 0.7–4.0)
MCH: 24.4 pg — ABNORMAL LOW (ref 26.0–34.0)
MCHC: 27.9 g/dL — ABNORMAL LOW (ref 30.0–36.0)
MCV: 87.4 fL (ref 80.0–100.0)
Monocytes Absolute: 0.5 10*3/uL (ref 0.1–1.0)
Monocytes Relative: 5 %
Neutro Abs: 9.2 10*3/uL — ABNORMAL HIGH (ref 1.7–7.7)
Neutrophils Relative %: 80 %
Platelet Count: 185 10*3/uL (ref 150–400)
RBC: 2.46 MIL/uL — ABNORMAL LOW (ref 3.87–5.11)
RDW: 22.5 % — ABNORMAL HIGH (ref 11.5–15.5)
WBC Count: 11.4 10*3/uL — ABNORMAL HIGH (ref 4.0–10.5)
nRBC: 0 % (ref 0.0–0.2)

## 2018-11-19 LAB — CMP (CANCER CENTER ONLY)
ALT: 9 U/L (ref 0–44)
AST: 12 U/L — ABNORMAL LOW (ref 15–41)
Albumin: 3.1 g/dL — ABNORMAL LOW (ref 3.5–5.0)
Alkaline Phosphatase: 54 U/L (ref 38–126)
Anion gap: 9 (ref 5–15)
BUN: 9 mg/dL (ref 6–20)
CO2: 21 mmol/L — ABNORMAL LOW (ref 22–32)
Calcium: 8.7 mg/dL — ABNORMAL LOW (ref 8.9–10.3)
Chloride: 113 mmol/L — ABNORMAL HIGH (ref 98–111)
Creatinine: 0.78 mg/dL (ref 0.44–1.00)
GFR, Est AFR Am: >60 mL/min (ref >60)
GFR, Estimated: >60 mL/min (ref >60)
Glucose, Bld: 160 mg/dL — ABNORMAL HIGH (ref 70–99)
Potassium: 3.5 mmol/L (ref 3.5–5.1)
Sodium: 143 mmol/L (ref 135–145)
Total Bilirubin: <0.2 mg/dL — ABNORMAL LOW (ref 0.3–1.2)
Total Protein: 6.3 g/dL — ABNORMAL LOW (ref 6.5–8.1)

## 2018-11-19 LAB — IRON AND TIBC
Iron: 16 ug/dL — ABNORMAL LOW (ref 41–142)
Saturation Ratios: 6 % — ABNORMAL LOW (ref 21–57)
TIBC: 258 ug/dL (ref 236–444)
UIBC: 241 ug/dL (ref 120–384)

## 2018-11-19 LAB — FERRITIN: Ferritin: 92 ng/mL (ref 11–307)

## 2018-11-19 LAB — RETICULOCYTES
Immature Retic Fract: 26.7 % — ABNORMAL HIGH (ref 2.3–15.9)
RBC.: 2.47 MIL/uL — ABNORMAL LOW (ref 3.87–5.11)
Retic Count, Absolute: 117.6 10*3/uL (ref 19.0–186.0)
Retic Ct Pct: 4.8 % — ABNORMAL HIGH (ref 0.4–3.1)

## 2018-11-19 LAB — PREPARE RBC (CROSSMATCH)

## 2018-11-19 MED ORDER — SODIUM CHLORIDE 0.9 % IV SOLN
125.0000 mg | Freq: Once | INTRAVENOUS | Status: AC
  Administered 2018-11-19: 125 mg via INTRAVENOUS
  Filled 2018-11-19: qty 10

## 2018-11-19 MED ORDER — SODIUM CHLORIDE 0.9% FLUSH
10.0000 mL | INTRAVENOUS | Status: DC | PRN
  Administered 2018-11-19: 10 mL
  Filled 2018-11-19: qty 10

## 2018-11-19 MED ORDER — HEPARIN SOD (PORK) LOCK FLUSH 100 UNIT/ML IV SOLN
500.0000 [IU] | Freq: Once | INTRAVENOUS | Status: AC | PRN
  Administered 2018-11-19: 500 [IU]
  Filled 2018-11-19: qty 5

## 2018-11-19 MED ORDER — SODIUM CHLORIDE 0.9 % IV SOLN
5.3000 mg/kg | Freq: Once | INTRAVENOUS | Status: AC
  Administered 2018-11-19: 600 mg via INTRAVENOUS
  Filled 2018-11-19: qty 16

## 2018-11-19 MED ORDER — SODIUM CHLORIDE 0.9 % IV SOLN
Freq: Once | INTRAVENOUS | Status: AC
  Administered 2018-11-19: 16:00:00 via INTRAVENOUS
  Filled 2018-11-19: qty 250

## 2018-11-19 MED ORDER — SODIUM CHLORIDE 0.9% FLUSH
10.0000 mL | Freq: Once | INTRAVENOUS | Status: AC
  Administered 2018-11-19: 10 mL
  Filled 2018-11-19: qty 10

## 2018-11-19 NOTE — Patient Instructions (Signed)

## 2018-11-19 NOTE — Patient Instructions (Signed)
[facility] Discharge Instructions for Patients Receiving Chemotherapy  Today you received the following chemotherapy agents: Bevacizumab (Avastin)  To help prevent nausea and vomiting after your treatment, we encourage you to take your nausea medication as directed.   If you develop nausea and vomiting that is not controlled by your nausea medication, call the clinic.   BELOW ARE SYMPTOMS THAT SHOULD BE REPORTED IMMEDIATELY:  *FEVER GREATER THAN 100.5 F  *CHILLS WITH OR WITHOUT FEVER  NAUSEA AND VOMITING THAT IS NOT CONTROLLED WITH YOUR NAUSEA MEDICATION  *UNUSUAL SHORTNESS OF BREATH  *UNUSUAL BRUISING OR BLEEDING  TENDERNESS IN MOUTH AND THROAT WITH OR WITHOUT PRESENCE OF ULCERS  *URINARY PROBLEMS  *BOWEL PROBLEMS  UNUSUAL RASH Items with * indicate a potential emergency and should be followed up as soon as possible.  Feel free to call the clinic should you have any questions or concerns. The clinic phone number is [phone].  Please show the [facility] at check-in to the Emergency Department and triage nurse.  Sodium Ferric Gluconate Complex injection What is this medicine? SODIUM FERRIC GLUCONATE COMPLEX (SOE dee um FER ik GLOO koe nate KOM pleks) is an iron replacement. It is used with epoetin therapy to treat low iron levels in patients who are receiving hemodialysis. This medicine may be used for other purposes; ask your health care provider or pharmacist if you have questions. COMMON BRAND NAME(S): Ferrlecit, Nulecit What should I tell my health care provider before I take this medicine? They need to know if you have any of the following conditions:  anemia that is not from iron deficiency  high levels of iron in the body  an unusual or allergic reaction to iron, benzyl alcohol, other medicines, foods, dyes, or preservatives  pregnant or are trying to become pregnant  breast-feeding How should I use this medicine? This medicine is  for infusion into a vein. It is given by a health care professional in a hospital or clinic setting. Talk to your pediatrician regarding the use of this medicine in children. While this drug may be prescribed for children as young as 60 years old for selected conditions, precautions do apply. Overdosage: If you think you have taken too much of this medicine contact a poison control center or emergency room at once. NOTE: This medicine is only for you. Do not share this medicine with others. What if I miss a dose? It is important not to miss your dose. Call your doctor or health care professional if you are unable to keep an appointment. What may interact with this medicine? Do not take this medicine with any of the following medications:  deferoxamine  dimercaprol  other iron products This medicine may also interact with the following medications:  chloramphenicol  deferasirox  medicine for blood pressure like enalapril This list may not describe all possible interactions. Give your health care provider a list of all the medicines, herbs, non-prescription drugs, or dietary supplements you use. Also tell them if you smoke, drink alcohol, or use illegal drugs. Some items may interact with your medicine. What should I watch for while using this medicine? Your condition will be monitored carefully while you are receiving this medicine. Visit your doctor for check-ups as directed. What side effects may I notice from receiving this medicine? Side effects that you should report to your doctor or health care professional as soon as possible:  allergic reactions like skin rash, itching or hives, swelling of the face, lips, or  tongue  breathing problems  changes in hearing  changes in vision  chills, flushing, or sweating  fast, irregular heartbeat  feeling faint or lightheaded, falls  fever, flu-like symptoms  high or low blood pressure  pain, tingling, numbness in the hands or  feet  severe pain in the chest, back, flanks, or groin  swelling of the ankles, feet, hands  trouble passing urine or change in the amount of urine  unusually weak or tired Side effects that usually do not require medical attention (report to your doctor or health care professional if they continue or are bothersome):  cramps  dark colored stools  diarrhea  headache  nausea, vomiting  stomach upset This list may not describe all possible side effects. Call your doctor for medical advice about side effects. You may report side effects to FDA at 1-800-FDA-1088. Where should I keep my medicine? This drug is given in a hospital or clinic and will not be stored at home. NOTE: This sheet is a summary. It may not cover all possible information. If you have questions about this medicine, talk to your doctor, pharmacist, or health care provider.  2020 Elsevier/Gold Standard (2007-12-02 15:58:57)  Coronavirus (COVID-19) Are you at risk?  Are you at risk for the Coronavirus (COVID-19)?  To be considered HIGH RISK for Coronavirus (COVID-19), you have to meet the following criteria:  . Traveled to [place], [place], [place], [place] or [place]; or in the [place] to [place], [place], [place], or [place]; and have fever, cough, and shortness of breath within the last 2 weeks of travel OR . Been in close contact with a person diagnosed with COVID-19 within the last 2 weeks and have fever, cough, and shortness of breath . IF YOU DO NOT MEET THESE CRITERIA, YOU ARE CONSIDERED LOW RISK FOR COVID-19.  What to do if you are HIGH RISK for COVID-19?  [name] If you are having a medical emergency, call 911. . Seek medical care right away. Before you go to a doctor's office, urgent care or emergency department, call ahead and tell them about your recent travel, contact with someone diagnosed with COVID-19, and your symptoms. You should receive instructions from your physician's office regarding  next steps of care.  . When you arrive at healthcare provider, tell the healthcare staff immediately you have returned from visiting [place], [place], [place], [place] or [place]; or traveled in the [place] to [place], [place], [place], or [place]; in the last two weeks or you have been in close contact with a person diagnosed with COVID-19 in the last 2 weeks.   . Tell the health care staff about your symptoms: fever, cough and shortness of breath. . After you have been seen by a medical provider, you will be either: o Tested for (COVID-19) and discharged home on quarantine except to seek medical care if symptoms worsen, and asked to  - Stay home and avoid contact with others until you get your results (4-5 days)  - Avoid travel on public transportation if possible (such as bus, train, or airplane) or o Sent to the Emergency Department by EMS for evaluation, COVID-19 testing, and possible admission depending on your condition and test results.  What to do if you are LOW RISK for COVID-19?  Reduce your risk of any infection by using the same precautions used for avoiding the common cold or flu:  [name] Wash your hands often with soap and warm water for at least  20 seconds.  If soap and water are not readily available, use an alcohol-based hand sanitizer with at least 60% alcohol.  . If coughing or sneezing, cover your mouth and nose by coughing or sneezing into the elbow areas of your shirt or coat, into a tissue or into your sleeve (not your hands). . Avoid shaking hands with others and consider head nods or verbal greetings only. . Avoid touching your eyes, nose, or mouth with unwashed hands.  . Avoid close contact with people who are sick. . Avoid places or events with large numbers of people in one location, like concerts or sporting events. . Carefully consider travel plans you have or are making. . If you are planning any travel outside or inside the [place], visit the CDC's Travelers'  Health webpage for the latest health notices. . If you have some symptoms but not all symptoms, continue to monitor at home and seek medical attention if your symptoms worsen. . If you are having a medical emergency, call 911.   [place] / e-Visit: [email or web address]         [facility]: [facility]: [phone]                   [facility]: [phone]

## 2018-11-19 NOTE — Progress Notes (Signed)
Confirmed w/ Dr. Burr Medico - Iron & Avastin today. Kennith Center, Pharm.D., CPP 11/19/2018@3 :37 PM

## 2018-11-19 NOTE — Telephone Encounter (Signed)
Received call report from [facility].  "Today's Hgb = 6.0."  Reached provider with results.  Next scheduled appt. Infusion at 2:00 pm with scheduled provider F/U today.  No response for Triage.

## 2018-11-19 NOTE — Progress Notes (Addendum)
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 was seen in the treatment room. She was last seen by Dr. Burr Medico on 11/05/18 and completed avastin and IV ferric gluconate. For past 4 days she reports black runny stools with "good amount" of bright red blood per rectum twice daily. She had transient RLQ pain a few days ago that resolved. No other abdominal pain. Denies n/v. She also has nosebleeds daily for up to 30 minutes. Her productive cough is stable, no chest pain or dyspnea. Denies fever or chills. She is more fatigued. She has an aid help her at home. Appetite fluctuates.   MEDICAL HISTORY:  Past Medical History:  Diagnosis Date  . Anxiety   . Arthritis    knnes,back  . GERD (gastroesophageal reflux disease)   . 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 History of swelling of feet   . Hyperlipidemia   . Hypertension   . Major depressive disorder, recurrent episode ([place]) 06/05/2015  . Obesity   . Snores     SURGICAL HISTORY: Past Surgical History:  Procedure Laterality Date  . ABDOMINAL HYSTERECTOMY    . CARPAL  TUNNEL RELEASE  05/13/2011   Procedure: CARPAL TUNNEL RELEASE;  Surgeon: Nita Sells, MD;  Location: [facility];  Service: Orthopedics;  Laterality: Left;  . COLONOSCOPY WITH PROPOFOL N/A 04/28/2014   Procedure: COLONOSCOPY WITH PROPOFOL;  Surgeon: Cleotis Nipper, MD;  Location: [facility] ENDOSCOPY;  Service: Endoscopy;  Laterality: N/A;  . DG TOES*L*  2/10   rt  . DILATION AND CURETTAGE OF UTERUS    . ENTEROSCOPY N/A 10/17/2017   Procedure: ENTEROSCOPY;  Surgeon: Otis Brace, MD;  Location: [facility] ENDOSCOPY;  Service: Gastroenterology;  Laterality: N/A;  . ESOPHAGOGASTRODUODENOSCOPY N/A 04/10/2014   Procedure: ESOPHAGOGASTRODUODENOSCOPY (EGD);  Surgeon: [name], MD;  Location: [facility] ENDOSCOPY;  Service: Endoscopy;  Laterality: N/A;  . ESOPHAGOGASTRODUODENOSCOPY N/A 05/10/2017   Procedure: ESOPHAGOGASTRODUODENOSCOPY (EGD);  Surgeon: Ronald Lobo, MD;  Location: [facility] ENDOSCOPY;  Service: Endoscopy;  Laterality: N/A;  . ESOPHAGOGASTRODUODENOSCOPY N/A 09/22/2017   Procedure: ESOPHAGOGASTRODUODENOSCOPY (EGD);  Surgeon: Clarene Essex, MD;  Location: [facility];  Service: Endoscopy;  Laterality: N/A;  bedside  . ESOPHAGOGASTRODUODENOSCOPY (EGD) WITH PROPOFOL N/A 04/27/2014   Procedure: ESOPHAGOGASTRODUODENOSCOPY (EGD) WITH PROPOFOL;  Surgeon: Cleotis Nipper, MD;  Location: [place];  Service: Endoscopy;  Laterality: N/A;  possible apc  . ESOPHAGOGASTRODUODENOSCOPY (EGD) WITH PROPOFOL N/A 09/30/2017   Procedure: ESOPHAGOGASTRODUODENOSCOPY (EGD) WITH PROPOFOL;  Surgeon: [name], MD;  Location: [place] [facility];  Service: Gastroenterology;  Laterality: N/A;  . ESOPHAGOGASTRODUODENOSCOPY (EGD) WITH PROPOFOL N/A 10/01/2017   Procedure: ESOPHAGOGASTRODUODENOSCOPY (EGD) WITH PROPOFOL;  Surgeon: [name], MD;  Location: [facility];  Service: Gastroenterology;  Laterality: N/A;  .  ESOPHAGOGASTRODUODENOSCOPY (EGD) WITH PROPOFOL N/A 10/08/2017   Procedure:  ESOPHAGOGASTRODUODENOSCOPY (EGD) WITH PROPOFOL;  Surgeon: Otis Brace, MD;  Location: [place];  Service: Gastroenterology;  Laterality: N/A;  . ESOPHAGOGASTRODUODENOSCOPY (EGD) WITH PROPOFOL N/A 10/17/2017   Procedure: ESOPHAGOGASTRODUODENOSCOPY (EGD) WITH PROPOFOL;  Surgeon: Otis Brace, MD;  Location: [facility] ENDOSCOPY;  Service: Gastroenterology;  Laterality: N/A;  . ESOPHAGOGASTRODUODENOSCOPY (EGD) WITH PROPOFOL N/A 10/19/2017   Procedure: ESOPHAGOGASTRODUODENOSCOPY (EGD) WITH PROPOFOL;  Surgeon: Otis Brace, MD;  Location: [facility] ENDOSCOPY;  Service: Gastroenterology;  Laterality: N/A;  . GIVENS CAPSULE STUDY N/A 10/02/2017   Procedure: GIVENS CAPSULE STUDY;  Surgeon: [name], MD;  Location: [facility];  Service: Gastroenterology;  Laterality: N/A;  . GIVENS CAPSULE STUDY N/A 10/08/2017   Procedure: GIVENS CAPSULE STUDY;  Surgeon: Otis Brace, MD;  Location: [facility];  Service: Gastroenterology;  Laterality: N/A;  endoscopic placement of capsule  . HOT HEMOSTASIS N/A 04/27/2014   Procedure: HOT HEMOSTASIS (ARGON PLASMA COAGULATION/BICAP);  Surgeon: Cleotis Nipper, MD;  Location: [facility] ENDOSCOPY;  Service: Endoscopy;  Laterality: N/A;  . HOT HEMOSTASIS N/A 09/30/2017   Procedure: HOT HEMOSTASIS (ARGON PLASMA COAGULATION/BICAP);  Surgeon: [name], MD;  Location: [place];  Service: Gastroenterology;  Laterality: N/A;  . HOT HEMOSTASIS N/A 10/01/2017   Procedure: HOT HEMOSTASIS (ARGON PLASMA COAGULATION/BICAP);  Surgeon: [name], MD;  Location: [place];  Service: Gastroenterology;  Laterality: N/A;  . HOT HEMOSTASIS N/A 10/17/2017   Procedure: HOT HEMOSTASIS (ARGON PLASMA COAGULATION/BICAP);  Surgeon: Otis Brace, MD;  Location: [facility] ENDOSCOPY;  Service: Gastroenterology;  Laterality: N/A;  . HOT HEMOSTASIS N/A 10/19/2017   Procedure: HOT HEMOSTASIS (ARGON PLASMA COAGULATION/BICAP);  Surgeon: Otis Brace, MD;  Location: [facility] ENDOSCOPY;  Service:  Gastroenterology;  Laterality: N/A;  . IR IMAGING GUIDED PORT INSERTION  07/08/2018  . L shoulder Surgery  2011  . SUBMUCOSAL INJECTION  09/22/2017   Procedure: SUBMUCOSAL INJECTION;  Surgeon: Clarene Essex, MD;  Location[facility] ENDOSCOPY;  Service: Endoscopy;;    I have reviewed the social history and family history with the patient and they are unchanged from previous note.  ALLERGIES:  is allergic to feraheme [ferumoxytol]; nsaids; tomato; and wasp venom.  MEDICATIONS:  Current Outpatient Medications  Medication Sig Dispense Refill  . albuterol (PROVENTIL HFA;VENTOLIN HFA) 108 (90 Base) MCG/ACT inhaler Inhale 1-2 puffs into the lungs every 6 (six) hours as needed for wheezing or shortness of breath. 1 Inhaler 3  . ALPRAZolam (XANAX) 0.5 MG tablet TAKE 1 TABLET(0.5 MG) BY MOUTH TWICE DAILY AS NEEDED FOR ANXIETY 60 tablet 0  . Aminocaproic Acid (AMICAR) 1000 MG TABS Take 1 tablet (1,000 mg total) by mouth 2 (two) times daily. 60 each 3  . amLODipine (NORVASC) 10 MG tablet Take 1 tablet (10 mg total) by mouth daily. 90 tablet 0  . atorvastatin (LIPITOR) 80 MG tablet TAKE 1 TABLET(80 MG) BY MOUTH DAILY 30 tablet 3  . ferrous gluconate (FERGON) 324 MG tablet Take 1 tablet (324 mg total) by mouth 3 (three) times daily with meals. 90 tablet 0  . hydrochlorothiazide (HYDRODIURIL) 12.5 MG tablet TAKE 1 TABLET(12.5 MG) BY MOUTH DAILY 90 tablet 0  . lidocaine-prilocaine (EMLA) cream Apply 1 application topically as needed. 30 g 0  . pantoprazole (PROTONIX) 40 MG tablet Take 1 tablet (40 mg total) by mouth daily. 30 tablet 3  . prochlorperazine (COMPAZINE) 10 MG tablet Take 1 tablet (10 mg total) by mouth every 6 (six) hours as needed for nausea or vomiting. 30 tablet 1  .  sertraline (ZOLOFT) 50 MG tablet TAKE 1 TABLET(50 MG) BY MOUTH DAILY 90 tablet 0  . tamoxifen (NOLVADEX) 20 MG tablet Take 1 tablet (20 mg total) by mouth daily. 90 tablet 3   No current facility-administered medications for this visit.     Facility-Administered Medications Ordered in Other Visits  Medication Dose Route Frequency Provider Last Rate Last Dose  . 0.9 %  sodium chloride infusion   Intravenous Once Truitt Merle, MD      . bevacizumab (AVASTIN) 600 mg in sodium chloride 0.9 % 100 mL chemo infusion  5.3 mg/kg (Treatment Plan Recorded) Intravenous Once Truitt Merle, MD      . ferric gluconate (NULECIT) 125 mg in sodium chloride 0.9 % 100 mL IVPB  125 mg Intravenous Once Truitt Merle, MD 110 mL/hr at 11/19/18 1552 125 mg at 11/19/18 1552  . heparin lock flush 100 unit/mL  500 Units Intracatheter Once PRN Truitt Merle, MD      . sodium chloride flush (NS) 0.9 % injection 10 mL  10 mL Intracatheter PRN Truitt Merle, MD        PHYSICAL EXAMINATION: ECOG PERFORMANCE STATUS: 3 - Symptomatic, >50% confined to bed See infusion flow sheet for vitals   GENERAL:alert, no distress and comfortable SKIN: no rash  EYES: sclera clear LUNGS: mild expiratory wheezes in bases; with normal breathing effort HEART: regular rate & rhythm  ABDOMEN:abdomen soft, non-tender and normal bowel sounds Musculoskeletal:no cyanosis of digits  NEURO: alert & oriented x 3 with fluent speech PAC without erythema   LABORATORY DATA:  I have reviewed the data as listed CBC Latest Ref Rng & Units 11/19/2018 11/12/2018 11/05/2018  WBC 4.0 - 10.5 K/uL 11.4(H) 10.7(H) 12.1(H)  Hemoglobin 12.0 - 15.0 g/dL 6.0(LL) 7.3(L) 8.4(L)  Hematocrit 36.0 - 46.0 % 21.5(L) 26.1(L) 29.0(L)  Platelets 150 - 400 K/uL 185 258 337     CMP Latest Ref Rng & Units 11/19/2018 11/05/2018 10/29/2018  Glucose 70 - 99 mg/dL 160(H) 137(H) 126(H)  BUN 6 - 20 mg/dL 9 9 9   Creatinine 0.44 - 1.00 mg/dL 0.78 1.03(H) 1.06(H)  Sodium 135 - 145 mmol/L 143 142 141  Potassium 3.5 - 5.1 mmol/L 3.5 3.6 3.3(L)  Chloride 98 - 111 mmol/L 113(H) 108 106  CO2 22 - 32 mmol/L 21(L) 25 25  Calcium 8.9 - 10.3 mg/dL 8.7(L) 9.0 8.4(L)  Total Protein 6.5 - 8.1 g/dL 6.3(L) 7.2 7.2  Total Bilirubin 0.3 - 1.2  mg/dL <0.2(L) 0.3 0.2(L)  Alkaline Phos 38 - 126 U/L 54 62 62  AST 15 - 41 U/L 12(L) 12(L) 11(L)  ALT 0 - 44 U/L 9 <6 6      RADIOGRAPHIC STUDIES: I have personally reviewed the radiological images as listed and agreed with the findings in the report. No results found.   ASSESSMENT & PLAN: Peggy House a 58 y.o.femalewith   1. 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 Reactive Leukocytosis and reactive thrombocytosis  4. 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 Cristina Gong and [facility]   Dispo: Ms.  House appears stable. She has evidence of active GI bleeding and symptomatic anemia, Hg 6.0. she is hemodynamically stable. The patient was seen with Dr. Burr Medico who recommended inpatient admission for endoscopy and treatment but Peggy House did not agree. We have reached out to GI team at [facility] Dr. Alessandra Bevels for urgent outpatient evaluation. She will proceed with IV Ferric gluconate and avastin today. She will return on 11/20/18 for 2 units RBCs. Will continue weekly iron and q2 weeks avastin for now. She will get lab and possible RBC on 8/10 or 8/11 early next week.    Orders Placed This Encounter  Procedures  . Practitioner attestation of consent    I, the ordering practitioner, attest that I have discussed with the patient the benefits, risks, side effects, alternatives, likelihood of achieving goals and potential problems  during recovery for the procedure listed.    Standing Status:   Future    Standing Expiration Date:   11/19/2019    Order Specific Question:   Procedure    Answer:   Blood Product(s)  . Complete patient signature process for consent form    Standing Status:   Future    Standing Expiration Date:   11/19/2019  . Care order/instruction    Transfuse Parameters    Standing Status:   Future    Standing Expiration Date:   11/19/2019  . Type and screen    Standing Status:   Future    Number of Occurrences:   1    Standing Expiration Date:   11/19/2019   All questions were answered. The patient knows to call the clinic with any problems, questions or concerns. No barriers to learning was detected.     Alla Feeling, NP 11/19/18   Addendum  I have seen the patient, examined her. I agree with the assessment and and plan and have edited the notes.   Peggy House has more GI bleeding since 4 days ago, especially last night. Hg dropped to 6.0 today, VS stable. I recommend hospital admission for blood transfusion and endoscopy for APC. She declined.  She is scheduled to for blood transfusion tomorrow, will give 2 units.  Repeat lab next Monday.  I called her local GI Dr. Rosalie Gums who will arrange her endoscopy ASAP. She previously had gastric artery branch embolization at [facility], which significantly slowed down her GI bleeding.  I also spoke with IR Dr. Pascal Lux, who recommend endoscopy treatment first, embolization will be only used if she has severe bleeding and no other options available.  Truitt Merle  11/19/2018

## 2018-11-20 ENCOUNTER — Inpatient Hospital Stay: Payer: Medicaid Other

## 2018-11-20 ENCOUNTER — Telehealth: Payer: Self-pay | Admitting: Hematology

## 2018-11-20 ENCOUNTER — Other Ambulatory Visit: Payer: Self-pay

## 2018-11-20 DIAGNOSIS — M1990 Unspecified osteoarthritis, unspecified site: Secondary | ICD-10-CM | POA: Diagnosis not present

## 2018-11-20 DIAGNOSIS — I780 Hereditary hemorrhagic telangiectasia: Secondary | ICD-10-CM

## 2018-11-20 DIAGNOSIS — Z5112 Encounter for antineoplastic immunotherapy: Secondary | ICD-10-CM | POA: Diagnosis not present

## 2018-11-20 MED ORDER — SODIUM CHLORIDE 0.9% IV SOLUTION
250.0000 mL | Freq: Once | INTRAVENOUS | Status: AC
  Administered 2018-11-20: 250 mL via INTRAVENOUS
  Filled 2018-11-20: qty 250

## 2018-11-20 MED ORDER — HEPARIN SOD (PORK) LOCK FLUSH 100 UNIT/ML IV SOLN
500.0000 [IU] | Freq: Every day | INTRAVENOUS | Status: AC | PRN
  Administered 2018-11-20: 500 [IU]
  Filled 2018-11-20: qty 5

## 2018-11-20 MED ORDER — SODIUM CHLORIDE 0.9% FLUSH
10.0000 mL | INTRAVENOUS | Status: AC | PRN
  Administered 2018-11-20: 10 mL
  Filled 2018-11-20: qty 10

## 2018-11-20 NOTE — Telephone Encounter (Signed)
I spoke with Pt's local GI Dr. Cristina Gong today regarding pt's endoscopical therapy for her GI bleeding from AVM. He feels it's much better to admit pt and do endoscopy in hospital. It will be difficult to arrange this as outpt, due to the requirement of COVID testing and frequent lab monitoring. I saw pt in infusion room afterwards when she was getting blood transfusion and relayed the above information to her. She had small BM last night and one episode of black stool today. She does not want to be admitted to hospital at this point, but agreed to go to ED if she has more GI bleeding or feeling very weak or dizzy. I will arrange her lab CBC 2-3 times next week and f/u closely. She agrees to come in at 8am on Monday 8/10 for lab.   Truitt Merle  11/20/2018

## 2018-11-20 NOTE — Patient Instructions (Signed)
Blood Transfusion, Adult, Care After This sheet gives you information about how to care for yourself after your procedure. Your doctor may also give you more specific instructions. If you have problems or questions, contact your doctor. Follow these instructions at home:   Take over-the-counter and prescription medicines only as told by your doctor.  Go back to your normal activities as told by your doctor.  Follow instructions from your doctor about how to take care of the area where an IV tube was put into your vein (insertion site). Make sure you: ? Wash your hands with soap and water before you change your bandage (dressing). If there is no soap and water, use hand sanitizer. ? Change your bandage as told by your doctor.  Check your IV insertion site every day for signs of infection. Check for: ? More redness, swelling, or pain. ? More fluid or blood. ? Warmth. ? Pus or a bad smell. Contact a doctor if:  You have more redness, swelling, or pain around the IV insertion site.  You have more fluid or blood coming from the IV insertion site.  Your IV insertion site feels warm to the touch.  You have pus or a bad smell coming from the IV insertion site.  Your pee (urine) turns pink, red, or brown.  You feel weak after doing your normal activities. Get help right away if:  You have signs of a serious allergic or body defense (immune) system reaction, including: ? Itchiness. ? Hives. ? Trouble breathing. ? Anxiety. ? Pain in your chest or lower back. ? Fever, flushing, and chills. ? Fast pulse. ? Rash. ? Watery poop (diarrhea). ? Throwing up (vomiting). ? Dark pee. ? Serious headache. ? Dizziness. ? Stiff neck. ? Yellow color in your face or the white parts of your eyes (jaundice). Summary  After a blood transfusion, return to your normal activities as told by your doctor.  Every day, check for signs of infection where the IV tube was put into your vein.  Some  signs of infection are warm skin, more redness and pain, more fluid or blood, and pus or a bad smell where the needle went in.  Contact your doctor if you feel weak or have any unusual symptoms. This information is not intended to replace advice given to you by your health care provider. Make sure you discuss any questions you have with your health care provider. Document Released: 04/22/2014 Document Revised: 08/06/2017 Document Reviewed: 11/24/2015 Elsevier Patient Education  2020 Elsevier Inc.  

## 2018-11-21 LAB — BPAM RBC
Blood Product Expiration Date: 202008252359
Blood Product Expiration Date: 202008272359
ISSUE DATE / TIME: 202008071341
ISSUE DATE / TIME: 202008071341
Unit Type and Rh: 600
Unit Type and Rh: 600

## 2018-11-21 LAB — TYPE AND SCREEN
ABO/RH(D): A NEG
Antibody Screen: NEGATIVE
Unit division: 0
Unit division: 0

## 2018-11-23 ENCOUNTER — Inpatient Hospital Stay: Payer: Medicaid Other

## 2018-11-23 ENCOUNTER — Other Ambulatory Visit: Payer: Self-pay

## 2018-11-23 ENCOUNTER — Other Ambulatory Visit: Payer: Medicaid Other

## 2018-11-23 ENCOUNTER — Telehealth: Payer: Self-pay

## 2018-11-23 DIAGNOSIS — M1990 Unspecified osteoarthritis, unspecified site: Secondary | ICD-10-CM | POA: Diagnosis not present

## 2018-11-23 DIAGNOSIS — I780 Hereditary hemorrhagic telangiectasia: Secondary | ICD-10-CM

## 2018-11-23 DIAGNOSIS — Z5112 Encounter for antineoplastic immunotherapy: Secondary | ICD-10-CM | POA: Diagnosis not present

## 2018-11-23 LAB — CBC WITH DIFFERENTIAL (CANCER CENTER ONLY)
Abs Immature Granulocytes: 0.06 10*3/uL (ref 0.00–0.07)
Basophils Absolute: 0 10*3/uL (ref 0.0–0.1)
Basophils Relative: 0 %
Eosinophils Absolute: 0.1 10*3/uL (ref 0.0–0.5)
Eosinophils Relative: 1 %
HCT: 28 % — ABNORMAL LOW (ref 36.0–46.0)
Hemoglobin: 8.2 g/dL — ABNORMAL LOW (ref 12.0–15.0)
Immature Granulocytes: 1 %
Lymphocytes Relative: 11 %
Lymphs Abs: 1.2 10*3/uL (ref 0.7–4.0)
MCH: 26.4 pg (ref 26.0–34.0)
MCHC: 29.3 g/dL — ABNORMAL LOW (ref 30.0–36.0)
MCV: 90 fL (ref 80.0–100.0)
Monocytes Absolute: 0.5 10*3/uL (ref 0.1–1.0)
Monocytes Relative: 4 %
Neutro Abs: 9.4 10*3/uL — ABNORMAL HIGH (ref 1.7–7.7)
Neutrophils Relative %: 83 %
Platelet Count: 171 10*3/uL (ref 150–400)
RBC: 3.11 MIL/uL — ABNORMAL LOW (ref 3.87–5.11)
RDW: 21.9 % — ABNORMAL HIGH (ref 11.5–15.5)
WBC Count: 11.3 10*3/uL — ABNORMAL HIGH (ref 4.0–10.5)
nRBC: 0.3 % — ABNORMAL HIGH (ref 0.0–0.2)

## 2018-11-23 LAB — SAMPLE TO BLOOD BANK

## 2018-11-23 NOTE — Telephone Encounter (Signed)
Patient called stating she overslept, rescheduled her for 2:30 this afternoon for lab only, instructed her to stay after blood is drawn so that we know her results.  She verbalized an understanding.  She says her stools have gone back to being brown in color. Made Dr. Burr Medico aware.

## 2018-11-24 ENCOUNTER — Telehealth: Payer: Self-pay | Admitting: *Deleted

## 2018-11-24 DIAGNOSIS — M1990 Unspecified osteoarthritis, unspecified site: Secondary | ICD-10-CM | POA: Diagnosis not present

## 2018-11-24 NOTE — Telephone Encounter (Signed)
Received call from Dr Cristina Gong asking about pt & what plan was to f/u.  Read Dr Ernestina Penna note & reported to Dr Cristina Gong.  He states that he will be glad to f/u if pt needs to be admitted.  Dr Burr Medico can call him if she needs to discuss. Message forwarded to Dr Burr Medico

## 2018-11-25 DIAGNOSIS — M1990 Unspecified osteoarthritis, unspecified site: Secondary | ICD-10-CM | POA: Diagnosis not present

## 2018-11-26 ENCOUNTER — Inpatient Hospital Stay: Payer: Medicaid Other

## 2018-11-26 ENCOUNTER — Other Ambulatory Visit: Payer: Self-pay

## 2018-11-26 VITALS — BP 146/78 | HR 72 | Temp 98.0°F | Resp 18

## 2018-11-26 DIAGNOSIS — D500 Iron deficiency anemia secondary to blood loss (chronic): Secondary | ICD-10-CM

## 2018-11-26 DIAGNOSIS — Z95828 Presence of other vascular implants and grafts: Secondary | ICD-10-CM

## 2018-11-26 DIAGNOSIS — M1990 Unspecified osteoarthritis, unspecified site: Secondary | ICD-10-CM | POA: Diagnosis not present

## 2018-11-26 DIAGNOSIS — Z5112 Encounter for antineoplastic immunotherapy: Secondary | ICD-10-CM | POA: Diagnosis not present

## 2018-11-26 DIAGNOSIS — I780 Hereditary hemorrhagic telangiectasia: Secondary | ICD-10-CM

## 2018-11-26 LAB — RETICULOCYTES
Immature Retic Fract: 28.7 % — ABNORMAL HIGH (ref 2.3–15.9)
RBC.: 3.03 MIL/uL — ABNORMAL LOW (ref 3.87–5.11)
Retic Count, Absolute: 155.1 10*3/uL (ref 19.0–186.0)
Retic Ct Pct: 5.1 % — ABNORMAL HIGH (ref 0.4–3.1)

## 2018-11-26 LAB — CMP (CANCER CENTER ONLY)
ALT: 11 U/L (ref 0–44)
AST: 13 U/L — ABNORMAL LOW (ref 15–41)
Albumin: 3.2 g/dL — ABNORMAL LOW (ref 3.5–5.0)
Alkaline Phosphatase: 61 U/L (ref 38–126)
Anion gap: 8 (ref 5–15)
BUN: 9 mg/dL (ref 6–20)
CO2: 25 mmol/L (ref 22–32)
Calcium: 8.4 mg/dL — ABNORMAL LOW (ref 8.9–10.3)
Chloride: 109 mmol/L (ref 98–111)
Creatinine: 0.82 mg/dL (ref 0.44–1.00)
GFR, Est AFR Am: >60 mL/min (ref >60)
GFR, Estimated: >60 mL/min (ref >60)
Glucose, Bld: 112 mg/dL — ABNORMAL HIGH (ref 70–99)
Potassium: 3.4 mmol/L — ABNORMAL LOW (ref 3.5–5.1)
Sodium: 142 mmol/L (ref 135–145)
Total Bilirubin: 0.4 mg/dL (ref 0.3–1.2)
Total Protein: 6.5 g/dL (ref 6.5–8.1)

## 2018-11-26 LAB — CBC WITH DIFFERENTIAL (CANCER CENTER ONLY)
Abs Immature Granulocytes: 0.04 10*3/uL (ref 0.00–0.07)
Basophils Absolute: 0 10*3/uL (ref 0.0–0.1)
Basophils Relative: 0 %
Eosinophils Absolute: 0.1 10*3/uL (ref 0.0–0.5)
Eosinophils Relative: 1 %
HCT: 27.2 % — ABNORMAL LOW (ref 36.0–46.0)
Hemoglobin: 7.9 g/dL — ABNORMAL LOW (ref 12.0–15.0)
Immature Granulocytes: 0 %
Lymphocytes Relative: 15 %
Lymphs Abs: 1.5 10*3/uL (ref 0.7–4.0)
MCH: 25.7 pg — ABNORMAL LOW (ref 26.0–34.0)
MCHC: 29 g/dL — ABNORMAL LOW (ref 30.0–36.0)
MCV: 88.6 fL (ref 80.0–100.0)
Monocytes Absolute: 0.5 10*3/uL (ref 0.1–1.0)
Monocytes Relative: 5 %
Neutro Abs: 8.4 10*3/uL — ABNORMAL HIGH (ref 1.7–7.7)
Neutrophils Relative %: 79 %
Platelet Count: 194 10*3/uL (ref 150–400)
RBC: 3.07 MIL/uL — ABNORMAL LOW (ref 3.87–5.11)
RDW: 21.2 % — ABNORMAL HIGH (ref 11.5–15.5)
WBC Count: 10.5 10*3/uL (ref 4.0–10.5)
nRBC: 0 % (ref 0.0–0.2)

## 2018-11-26 MED ORDER — HEPARIN SOD (PORK) LOCK FLUSH 100 UNIT/ML IV SOLN
500.0000 [IU] | Freq: Once | INTRAVENOUS | Status: AC | PRN
  Administered 2018-11-26: 18:00:00 500 [IU]
  Filled 2018-11-26: qty 5

## 2018-11-26 MED ORDER — SODIUM CHLORIDE 0.9 % IV SOLN
125.0000 mg | Freq: Once | INTRAVENOUS | Status: AC
  Administered 2018-11-26: 16:00:00 125 mg via INTRAVENOUS
  Filled 2018-11-26: qty 10

## 2018-11-26 MED ORDER — SODIUM CHLORIDE 0.9% FLUSH
10.0000 mL | Freq: Once | INTRAVENOUS | Status: AC
  Administered 2018-11-26: 18:00:00 10 mL
  Filled 2018-11-26: qty 10

## 2018-11-26 MED ORDER — SODIUM CHLORIDE 0.9% FLUSH
10.0000 mL | Freq: Once | INTRAVENOUS | Status: AC
  Administered 2018-11-26: 10 mL
  Filled 2018-11-26: qty 10

## 2018-11-26 MED ORDER — SODIUM CHLORIDE 0.9 % IV SOLN
Freq: Once | INTRAVENOUS | Status: AC
  Administered 2018-11-26: 16:00:00 via INTRAVENOUS
  Filled 2018-11-26: qty 250

## 2018-11-26 NOTE — Patient Instructions (Signed)
Sodium Ferric Gluconate Complex injection What is this medicine? SODIUM FERRIC GLUCONATE COMPLEX (SOE dee um FER ik GLOO koe nate KOM pleks) is an iron replacement. It is used with epoetin therapy to treat low iron levels in patients who are receiving hemodialysis. This medicine may be used for other purposes; ask your health care provider or pharmacist if you have questions. COMMON BRAND NAME(S): Ferrlecit, Nulecit What should I tell my health care provider before I take this medicine? They need to know if you have any of the following conditions:  anemia that is not from iron deficiency  high levels of iron in the body  an unusual or allergic reaction to iron, benzyl alcohol, other medicines, foods, dyes, or preservatives  pregnant or are trying to become pregnant  breast-feeding How should I use this medicine? This medicine is for infusion into a vein. It is given by a health care professional in a hospital or clinic setting. Talk to your pediatrician regarding the use of this medicine in children. While this drug may be prescribed for children as young as 6 years old for selected conditions, precautions do apply. Overdosage: If you think you have taken too much of this medicine contact a poison control center or emergency room at once. NOTE: This medicine is only for you. Do not share this medicine with others. What if I miss a dose? It is important not to miss your dose. Call your doctor or health care professional if you are unable to keep an appointment. What may interact with this medicine? Do not take this medicine with any of the following medications:  deferoxamine  dimercaprol  other iron products This medicine may also interact with the following medications:  chloramphenicol  deferasirox  medicine for blood pressure like enalapril This list may not describe all possible interactions. Give your health care provider a list of all the medicines, herbs,  non-prescription drugs, or dietary supplements you use. Also tell them if you smoke, drink alcohol, or use illegal drugs. Some items may interact with your medicine. What should I watch for while using this medicine? Your condition will be monitored carefully while you are receiving this medicine. Visit your doctor for check-ups as directed. What side effects may I notice from receiving this medicine? Side effects that you should report to your doctor or health care professional as soon as possible:  allergic reactions like skin rash, itching or hives, swelling of the face, lips, or tongue  breathing problems  changes in hearing  changes in vision  chills, flushing, or sweating  fast, irregular heartbeat  feeling faint or lightheaded, falls  fever, flu-like symptoms  high or low blood pressure  pain, tingling, numbness in the hands or feet  severe pain in the chest, back, flanks, or groin  swelling of the ankles, feet, hands  trouble passing urine or change in the amount of urine  unusually weak or tired Side effects that usually do not require medical attention (report to your doctor or health care professional if they continue or are bothersome):  cramps  dark colored stools  diarrhea  headache  nausea, vomiting  stomach upset This list may not describe all possible side effects. Call your doctor for medical advice about side effects. You may report side effects to FDA at 1-800-FDA-1088. Where should I keep my medicine? This drug is given in a hospital or clinic and will not be stored at home. NOTE: This sheet is a summary. It may not cover all   possible information. If you have questions about this medicine, talk to your doctor, pharmacist, or health care provider.  2020 Elsevier/Gold Standard (2007-12-02 15:58:57)  Bevacizumab injection What is this medicine? BEVACIZUMAB (be va SIZ yoo mab) is a monoclonal antibody. It is used to treat many types of  cancer. This medicine may be used for other purposes; ask your health care provider or pharmacist if you have questions. COMMON BRAND NAME(S): Avastin, MVASI, Zirabev What should I tell my health care provider before I take this medicine? They need to know if you have any of these conditions:  diabetes  heart disease  high blood pressure  history of coughing up blood  prior anthracycline chemotherapy (e.g., doxorubicin, daunorubicin, epirubicin)  recent or ongoing radiation therapy  recent or planning to have surgery  stroke  an unusual or allergic reaction to bevacizumab, hamster proteins, mouse proteins, other medicines, foods, dyes, or preservatives  pregnant or trying to get pregnant  breast-feeding How should I use this medicine? This medicine is for infusion into a vein. It is given by a health care professional in a hospital or clinic setting. Talk to your pediatrician regarding the use of this medicine in children. Special care may be needed. Overdosage: If you think you have taken too much of this medicine contact a poison control center or emergency room at once. NOTE: This medicine is only for you. Do not share this medicine with others. What if I miss a dose? It is important not to miss your dose. Call your doctor or health care professional if you are unable to keep an appointment. What may interact with this medicine? Interactions are not expected. This list may not describe all possible interactions. Give your health care provider a list of all the medicines, herbs, non-prescription drugs, or dietary supplements you use. Also tell them if you smoke, drink alcohol, or use illegal drugs. Some items may interact with your medicine. What should I watch for while using this medicine? Your condition will be monitored carefully while you are receiving this medicine. You will need important blood work and urine testing done while you are taking this medicine. This  medicine may increase your risk to bruise or bleed. Call your doctor or health care professional if you notice any unusual bleeding. This medicine should be started at least 28 days following major surgery and the site of the surgery should be totally healed. Check with your doctor before scheduling dental work or surgery while you are receiving this treatment. Talk to your doctor if you have recently had surgery or if you have a wound that has not healed. Do not become pregnant while taking this medicine or for 6 months after stopping it. Women should inform their doctor if they wish to become pregnant or think they might be pregnant. There is a potential for serious side effects to an unborn child. Talk to your health care professional or pharmacist for more information. Do not breast-feed an infant while taking this medicine and for 6 months after the last dose. This medicine has caused ovarian failure in some women. This medicine may interfere with the ability to have a child. You should talk to your doctor or health care professional if you are concerned about your fertility. What side effects may I notice from receiving this medicine? Side effects that you should report to your doctor or health care professional as soon as possible:  allergic reactions like skin rash, itching or hives, swelling of the face, lips,  or tongue  chest pain or chest tightness  chills  coughing up blood  high fever  seizures  severe constipation  signs and symptoms of bleeding such as bloody or black, tarry stools; red or dark-brown urine; spitting up blood or brown material that looks like coffee grounds; red spots on the skin; unusual bruising or bleeding from the eye, gums, or nose  signs and symptoms of a blood clot such as breathing problems; chest pain; severe, sudden headache; pain, swelling, warmth in the leg  signs and symptoms of a stroke like changes in vision; confusion; trouble speaking or  understanding; severe headaches; sudden numbness or weakness of the face, arm or leg; trouble walking; dizziness; loss of balance or coordination  stomach pain  sweating  swelling of legs or ankles  vomiting  weight gain Side effects that usually do not require medical attention (report to your doctor or health care professional if they continue or are bothersome):  back pain  changes in taste  decreased appetite  dry skin  nausea  tiredness This list may not describe all possible side effects. Call your doctor for medical advice about side effects. You may report side effects to FDA at 1-800-FDA-1088. Where should I keep my medicine? This drug is given in a hospital or clinic and will not be stored at home. NOTE: This sheet is a summary. It may not cover all possible information. If you have questions about this medicine, talk to your doctor, pharmacist, or health care provider.  2020 Elsevier/Gold Standard (2016-03-29 14:33:29)  Coronavirus (COVID-19) Are you at risk?  Are you at risk for the Coronavirus (COVID-19)?  To be considered HIGH RISK for Coronavirus (COVID-19), you have to meet the following criteria:  . Traveled to [place], [place], [place], [place] or [place]; or in the [place] to [place], [place], [place], or [place]; and have fever, cough, and shortness of breath within the last 2 weeks of travel OR . Been in close contact with a person diagnosed with COVID-19 within the last 2 weeks and have fever, cough, and shortness of breath . IF YOU DO NOT MEET THESE CRITERIA, YOU ARE CONSIDERED LOW RISK FOR COVID-19.  What to do if you are HIGH RISK for COVID-19?  [name] If you are having a medical emergency, call 911. . Seek medical care right away. Before you go to a doctor's office, urgent care or emergency department, call ahead and tell them about your recent travel, contact with someone diagnosed with COVID-19, and your symptoms. You should receive  instructions from your physician's office regarding next steps of care.  . When you arrive at healthcare provider, tell the healthcare staff immediately you have returned from visiting [place], [place], [place], [place] or [place]; or traveled in the [place] to [place], [place], [place], or [place]; in the last two weeks or you have been in close contact with a person diagnosed with COVID-19 in the last 2 weeks.   . Tell the health care staff about your symptoms: fever, cough and shortness of breath. . After you have been seen by a medical provider, you will be either: o Tested for (COVID-19) and discharged home on quarantine except to seek medical care if symptoms worsen, and asked to  - Stay home and avoid contact with others until you get your results (4-5 days)  - Avoid travel on public transportation if possible (such as bus, train, or airplane) or o Sent to the Emergency  Department by EMS for evaluation, COVID-19 testing, and possible admission depending on your condition and test results.  What to do if you are LOW RISK for COVID-19?  Reduce your risk of any infection by using the same precautions used for avoiding the common cold or flu:  [name] Wash your hands often with soap and warm water for at least 20 seconds.  If soap and water are not readily available, use an alcohol-based hand sanitizer with at least 60% alcohol.  . If coughing or sneezing, cover your mouth and nose by coughing or sneezing into the elbow areas of your shirt or coat, into a tissue or into your sleeve (not your hands). . Avoid shaking hands with others and consider head nods or verbal greetings only. . Avoid touching your eyes, nose, or mouth with unwashed hands.  . Avoid close contact with people who are sick. . Avoid places or events with large numbers of people in one location, like concerts or sporting events. . Carefully consider travel plans you have or are making. . If you are planning any travel  outside or inside the [place], visit the CDC's Travelers' Health webpage for the latest health notices. . If you have some symptoms but not all symptoms, continue to monitor at home and seek medical attention if your symptoms worsen. . If you are having a medical emergency, call 911.   [place] / e-Visit: [email or web address]         [facility]: [facility]: [phone]                   [facility]: [phone]

## 2018-11-27 DIAGNOSIS — M1990 Unspecified osteoarthritis, unspecified site: Secondary | ICD-10-CM | POA: Diagnosis not present

## 2018-11-27 LAB — FERRITIN: Ferritin: 103 ng/mL (ref 11–307)

## 2018-11-30 ENCOUNTER — Other Ambulatory Visit: Payer: Self-pay | Admitting: Hematology

## 2018-11-30 DIAGNOSIS — F411 Generalized anxiety disorder: Secondary | ICD-10-CM

## 2018-11-30 DIAGNOSIS — M1990 Unspecified osteoarthritis, unspecified site: Secondary | ICD-10-CM | POA: Diagnosis not present

## 2018-12-01 DIAGNOSIS — M1990 Unspecified osteoarthritis, unspecified site: Secondary | ICD-10-CM | POA: Diagnosis not present

## 2018-12-01 NOTE — Telephone Encounter (Signed)
See refill request.

## 2018-12-02 ENCOUNTER — Other Ambulatory Visit: Payer: Self-pay | Admitting: Nurse Practitioner

## 2018-12-02 DIAGNOSIS — F411 Generalized anxiety disorder: Secondary | ICD-10-CM

## 2018-12-02 DIAGNOSIS — M1990 Unspecified osteoarthritis, unspecified site: Secondary | ICD-10-CM | POA: Diagnosis not present

## 2018-12-02 MED ORDER — ALPRAZOLAM 0.5 MG PO TABS: ORAL_TABLET | ORAL | 0 refills | Status: DC

## 2018-12-02 MED ORDER — SERTRALINE HCL 50 MG PO TABS: ORAL_TABLET | ORAL | 0 refills | Status: DC

## 2018-12-03 ENCOUNTER — Inpatient Hospital Stay: Payer: Medicaid Other

## 2018-12-03 ENCOUNTER — Inpatient Hospital Stay (HOSPITAL_COMMUNITY)
Admission: EM | Admit: 2018-12-03 | Discharge: 2018-12-08 | DRG: 377 | Disposition: A | Discharge status: Home / Self Care | Payer: Medicaid Other | Attending: Nephrology | Admitting: Nephrology

## 2018-12-03 ENCOUNTER — Other Ambulatory Visit: Payer: Self-pay | Admitting: Nurse Practitioner

## 2018-12-03 ENCOUNTER — Telehealth: Payer: Self-pay | Admitting: *Deleted

## 2018-12-03 ENCOUNTER — Other Ambulatory Visit: Payer: Self-pay

## 2018-12-03 ENCOUNTER — Encounter (HOSPITAL_COMMUNITY): Payer: Self-pay

## 2018-12-03 ENCOUNTER — Other Ambulatory Visit: Payer: Self-pay | Admitting: *Deleted

## 2018-12-03 VITALS — BP 130/62 | HR 73 | Temp 97.8°F | Resp 18

## 2018-12-03 DIAGNOSIS — Q2733 Arteriovenous malformation of digestive system vessel: Secondary | ICD-10-CM | POA: Diagnosis present

## 2018-12-03 DIAGNOSIS — I10 Essential (primary) hypertension: Secondary | ICD-10-CM | POA: Diagnosis present

## 2018-12-03 DIAGNOSIS — D500 Iron deficiency anemia secondary to blood loss (chronic): Secondary | ICD-10-CM

## 2018-12-03 DIAGNOSIS — T17998A Other foreign object in respiratory tract, part unspecified causing other injury, initial encounter: Secondary | ICD-10-CM | POA: Diagnosis not present

## 2018-12-03 DIAGNOSIS — Z90710 Acquired absence of both cervix and uterus: Secondary | ICD-10-CM

## 2018-12-03 DIAGNOSIS — I872 Venous insufficiency (chronic) (peripheral): Secondary | ICD-10-CM | POA: Diagnosis present

## 2018-12-03 DIAGNOSIS — J9588 Other intraoperative complications of respiratory system, not elsewhere classified: Secondary | ICD-10-CM | POA: Diagnosis not present

## 2018-12-03 DIAGNOSIS — D62 Acute posthemorrhagic anemia: Secondary | ICD-10-CM | POA: Insufficient documentation

## 2018-12-03 DIAGNOSIS — R918 Other nonspecific abnormal finding of lung field: Secondary | ICD-10-CM | POA: Diagnosis not present

## 2018-12-03 DIAGNOSIS — I82C11 Acute embolism and thrombosis of right internal jugular vein: Secondary | ICD-10-CM | POA: Diagnosis not present

## 2018-12-03 DIAGNOSIS — Z20828 Contact with and (suspected) exposure to other viral communicable diseases: Secondary | ICD-10-CM | POA: Diagnosis not present

## 2018-12-03 DIAGNOSIS — R0989 Other specified symptoms and signs involving the circulatory and respiratory systems: Secondary | ICD-10-CM | POA: Diagnosis not present

## 2018-12-03 DIAGNOSIS — Z91030 Bee allergy status: Secondary | ICD-10-CM

## 2018-12-03 DIAGNOSIS — F419 Anxiety disorder, unspecified: Secondary | ICD-10-CM | POA: Diagnosis present

## 2018-12-03 DIAGNOSIS — K31819 Angiodysplasia of stomach and duodenum without bleeding: Secondary | ICD-10-CM | POA: Diagnosis present

## 2018-12-03 DIAGNOSIS — Z79899 Other long term (current) drug therapy: Secondary | ICD-10-CM

## 2018-12-03 DIAGNOSIS — J690 Pneumonitis due to inhalation of food and vomit: Secondary | ICD-10-CM | POA: Diagnosis present

## 2018-12-03 DIAGNOSIS — Z91018 Allergy to other foods: Secondary | ICD-10-CM

## 2018-12-03 DIAGNOSIS — K219 Gastro-esophageal reflux disease without esophagitis: Secondary | ICD-10-CM | POA: Diagnosis present

## 2018-12-03 DIAGNOSIS — F17210 Nicotine dependence, cigarettes, uncomplicated: Secondary | ICD-10-CM | POA: Diagnosis present

## 2018-12-03 DIAGNOSIS — K921 Melena: Secondary | ICD-10-CM | POA: Diagnosis not present

## 2018-12-03 DIAGNOSIS — E876 Hypokalemia: Secondary | ICD-10-CM | POA: Diagnosis not present

## 2018-12-03 DIAGNOSIS — Z95828 Presence of other vascular implants and grafts: Secondary | ICD-10-CM

## 2018-12-03 DIAGNOSIS — I780 Hereditary hemorrhagic telangiectasia: Secondary | ICD-10-CM | POA: Diagnosis present

## 2018-12-03 DIAGNOSIS — R040 Epistaxis: Secondary | ICD-10-CM | POA: Diagnosis not present

## 2018-12-03 DIAGNOSIS — E785 Hyperlipidemia, unspecified: Secondary | ICD-10-CM | POA: Diagnosis present

## 2018-12-03 DIAGNOSIS — G4700 Insomnia, unspecified: Secondary | ICD-10-CM | POA: Diagnosis not present

## 2018-12-03 DIAGNOSIS — M170 Bilateral primary osteoarthritis of knee: Secondary | ICD-10-CM | POA: Diagnosis present

## 2018-12-03 DIAGNOSIS — K920 Hematemesis: Secondary | ICD-10-CM

## 2018-12-03 DIAGNOSIS — I82621 Acute embolism and thrombosis of deep veins of right upper extremity: Secondary | ICD-10-CM | POA: Diagnosis not present

## 2018-12-03 DIAGNOSIS — Z6841 Body Mass Index (BMI) 40.0 and over, adult: Secondary | ICD-10-CM

## 2018-12-03 DIAGNOSIS — F411 Generalized anxiety disorder: Secondary | ICD-10-CM

## 2018-12-03 DIAGNOSIS — Z888 Allergy status to other drugs, medicaments and biological substances status: Secondary | ICD-10-CM

## 2018-12-03 DIAGNOSIS — Y839 Surgical procedure, unspecified as the cause of abnormal reaction of the patient, or of later complication, without mention of misadventure at the time of the procedure: Secondary | ICD-10-CM | POA: Diagnosis not present

## 2018-12-03 DIAGNOSIS — J9601 Acute respiratory failure with hypoxia: Secondary | ICD-10-CM | POA: Diagnosis not present

## 2018-12-03 DIAGNOSIS — Z800 Family history of malignant neoplasm of digestive organs: Secondary | ICD-10-CM

## 2018-12-03 DIAGNOSIS — M1990 Unspecified osteoarthritis, unspecified site: Secondary | ICD-10-CM | POA: Diagnosis not present

## 2018-12-03 DIAGNOSIS — K2901 Acute gastritis with bleeding: Secondary | ICD-10-CM | POA: Diagnosis not present

## 2018-12-03 DIAGNOSIS — J189 Pneumonia, unspecified organism: Secondary | ICD-10-CM | POA: Diagnosis not present

## 2018-12-03 DIAGNOSIS — E66813 Obesity, class 3: Secondary | ICD-10-CM | POA: Diagnosis present

## 2018-12-03 DIAGNOSIS — M479 Spondylosis, unspecified: Secondary | ICD-10-CM | POA: Diagnosis present

## 2018-12-03 DIAGNOSIS — J9600 Acute respiratory failure, unspecified whether with hypoxia or hypercapnia: Secondary | ICD-10-CM | POA: Diagnosis not present

## 2018-12-03 DIAGNOSIS — D649 Anemia, unspecified: Secondary | ICD-10-CM | POA: Diagnosis present

## 2018-12-03 DIAGNOSIS — F329 Major depressive disorder, single episode, unspecified: Secondary | ICD-10-CM | POA: Diagnosis present

## 2018-12-03 DIAGNOSIS — I82B11 Acute embolism and thrombosis of right subclavian vein: Secondary | ICD-10-CM | POA: Diagnosis not present

## 2018-12-03 DIAGNOSIS — K3182 Dieulafoy lesion (hemorrhagic) of stomach and duodenum: Principal | ICD-10-CM | POA: Diagnosis present

## 2018-12-03 DIAGNOSIS — Z8041 Family history of malignant neoplasm of ovary: Secondary | ICD-10-CM

## 2018-12-03 DIAGNOSIS — R609 Edema, unspecified: Secondary | ICD-10-CM | POA: Diagnosis not present

## 2018-12-03 DIAGNOSIS — Z4682 Encounter for fitting and adjustment of non-vascular catheter: Secondary | ICD-10-CM | POA: Diagnosis not present

## 2018-12-03 DIAGNOSIS — K922 Gastrointestinal hemorrhage, unspecified: Secondary | ICD-10-CM

## 2018-12-03 DIAGNOSIS — K2900 Acute gastritis without bleeding: Secondary | ICD-10-CM | POA: Diagnosis not present

## 2018-12-03 LAB — IRON AND TIBC
Iron: 19 ug/dL — ABNORMAL LOW (ref 41–142)
Saturation Ratios: 7 % — ABNORMAL LOW (ref 21–57)
TIBC: 276 ug/dL (ref 236–444)
UIBC: 258 ug/dL (ref 120–384)

## 2018-12-03 LAB — CMP (CANCER CENTER ONLY)
ALT: 7 U/L (ref 0–44)
AST: 11 U/L — ABNORMAL LOW (ref 15–41)
Albumin: 3 g/dL — ABNORMAL LOW (ref 3.5–5.0)
Alkaline Phosphatase: 57 U/L (ref 38–126)
Anion gap: 10 (ref 5–15)
BUN: 12 mg/dL (ref 6–20)
CO2: 23 mmol/L (ref 22–32)
Calcium: 8.1 mg/dL — ABNORMAL LOW (ref 8.9–10.3)
Chloride: 109 mmol/L (ref 98–111)
Creatinine: 0.84 mg/dL (ref 0.44–1.00)
GFR, Est AFR Am: >60 mL/min (ref >60)
GFR, Estimated: >60 mL/min (ref >60)
Glucose, Bld: 140 mg/dL — ABNORMAL HIGH (ref 70–99)
Potassium: 3.2 mmol/L — ABNORMAL LOW (ref 3.5–5.1)
Sodium: 142 mmol/L (ref 135–145)
Total Bilirubin: 0.2 mg/dL — ABNORMAL LOW (ref 0.3–1.2)
Total Protein: 6.1 g/dL — ABNORMAL LOW (ref 6.5–8.1)

## 2018-12-03 LAB — RETICULOCYTES
Immature Retic Fract: 28.9 % — ABNORMAL HIGH (ref 2.3–15.9)
RBC.: 1.93 MIL/uL — ABNORMAL LOW (ref 3.87–5.11)
Retic Count, Absolute: 170.8 10*3/uL (ref 19.0–186.0)
Retic Ct Pct: 8.9 % — ABNORMAL HIGH (ref 0.4–3.1)

## 2018-12-03 LAB — CBC WITH DIFFERENTIAL (CANCER CENTER ONLY)
Abs Immature Granulocytes: 0.03 10*3/uL (ref 0.00–0.07)
Basophils Absolute: 0 10*3/uL (ref 0.0–0.1)
Basophils Relative: 0 %
Eosinophils Absolute: 0.1 10*3/uL (ref 0.0–0.5)
Eosinophils Relative: 1 %
HCT: 17.9 % — ABNORMAL LOW (ref 36.0–46.0)
Hemoglobin: 5 g/dL — CL (ref 12.0–15.0)
Immature Granulocytes: 0 %
Lymphocytes Relative: 14 %
Lymphs Abs: 1.3 10*3/uL (ref 0.7–4.0)
MCH: 25.9 pg — ABNORMAL LOW (ref 26.0–34.0)
MCHC: 27.9 g/dL — ABNORMAL LOW (ref 30.0–36.0)
MCV: 92.7 fL (ref 80.0–100.0)
Monocytes Absolute: 0.4 10*3/uL (ref 0.1–1.0)
Monocytes Relative: 4 %
Neutro Abs: 7.6 10*3/uL (ref 1.7–7.7)
Neutrophils Relative %: 81 %
Platelet Count: 215 10*3/uL (ref 150–400)
RBC: 1.93 MIL/uL — ABNORMAL LOW (ref 3.87–5.11)
RDW: 23.1 % — ABNORMAL HIGH (ref 11.5–15.5)
WBC Count: 9.5 10*3/uL (ref 4.0–10.5)
nRBC: 0 % (ref 0.0–0.2)

## 2018-12-03 LAB — PREPARE RBC (CROSSMATCH)

## 2018-12-03 LAB — SARS CORONAVIRUS 2 BY RT PCR (HOSPITAL ORDER, PERFORMED IN ~~LOC~~ HOSPITAL LAB): SARS Coronavirus 2: NEGATIVE

## 2018-12-03 LAB — POC OCCULT BLOOD, ED: Fecal Occult Bld: POSITIVE — AB

## 2018-12-03 MED ORDER — SODIUM CHLORIDE 0.9 % IV SOLN
125.0000 mg | Freq: Once | INTRAVENOUS | Status: DC
  Filled 2018-12-03: qty 10

## 2018-12-03 MED ORDER — PANTOPRAZOLE SODIUM 40 MG PO TBEC
40.0000 mg | DELAYED_RELEASE_TABLET | Freq: Every day | ORAL | Status: DC
  Filled 2018-12-03: qty 1

## 2018-12-03 MED ORDER — SODIUM CHLORIDE 0.9 % IV SOLN
125.0000 mg | Freq: Once | INTRAVENOUS | Status: AC
  Administered 2018-12-04: 125 mg via INTRAVENOUS
  Filled 2018-12-03: qty 10

## 2018-12-03 MED ORDER — SODIUM CHLORIDE 0.9% FLUSH
10.0000 mL | Freq: Once | INTRAVENOUS | Status: AC
  Administered 2018-12-03: 10 mL
  Filled 2018-12-03: qty 10

## 2018-12-03 MED ORDER — GUAIFENESIN ER 600 MG PO TB12
600.0000 mg | ORAL_TABLET | Freq: Two times a day (BID) | ORAL | Status: DC
  Administered 2018-12-03 – 2018-12-08 (×7): 600 mg via ORAL
  Filled 2018-12-03 (×8): qty 1

## 2018-12-03 MED ORDER — ALPRAZOLAM 0.5 MG PO TABS
0.5000 mg | ORAL_TABLET | Freq: Two times a day (BID) | ORAL | Status: DC | PRN
  Administered 2018-12-03: 22:00:00 0.5 mg via ORAL
  Filled 2018-12-03: qty 1

## 2018-12-03 MED ORDER — DIPHENHYDRAMINE HCL 25 MG PO CAPS
25.0000 mg | ORAL_CAPSULE | Freq: Every evening | ORAL | Status: DC | PRN
  Administered 2018-12-03: 25 mg via ORAL
  Filled 2018-12-03: qty 1

## 2018-12-03 MED ORDER — ALBUTEROL SULFATE (2.5 MG/3ML) 0.083% IN NEBU: 2.5000 mg | INHALATION_SOLUTION | Freq: Four times a day (QID) | RESPIRATORY_TRACT | Status: DC | PRN

## 2018-12-03 MED ORDER — SODIUM CHLORIDE 0.9% FLUSH
10.0000 mL | INTRAVENOUS | Status: DC | PRN
  Administered 2018-12-04: 10 mL
  Administered 2018-12-06: 20 mL
  Filled 2018-12-03 (×2): qty 40

## 2018-12-03 MED ORDER — SODIUM CHLORIDE 0.9% IV SOLUTION
Freq: Once | INTRAVENOUS | Status: AC
  Administered 2018-12-03: 22:00:00 via INTRAVENOUS

## 2018-12-03 MED ORDER — SERTRALINE HCL 50 MG PO TABS
50.0000 mg | ORAL_TABLET | Freq: Every day | ORAL | Status: DC
  Administered 2018-12-06 – 2018-12-08 (×3): 50 mg via ORAL
  Filled 2018-12-03 (×3): qty 1

## 2018-12-03 MED ORDER — PROCHLORPERAZINE MALEATE 10 MG PO TABS
10.0000 mg | ORAL_TABLET | Freq: Four times a day (QID) | ORAL | Status: DC | PRN
  Administered 2018-12-06: 21:00:00 10 mg via ORAL
  Filled 2018-12-03: qty 1

## 2018-12-03 MED ORDER — ACETAMINOPHEN 500 MG PO TABS
500.0000 mg | ORAL_TABLET | Freq: Every evening | ORAL | Status: DC | PRN
  Administered 2018-12-03: 22:00:00 500 mg via ORAL
  Filled 2018-12-03: qty 1

## 2018-12-03 MED ORDER — DIPHENHYDRAMINE-APAP (SLEEP) 25-500 MG PO TABS: 1.0000 | ORAL_TABLET | Freq: Every evening | ORAL | Status: DC | PRN

## 2018-12-03 MED ORDER — SODIUM CHLORIDE 0.9% IV SOLUTION: Freq: Once | INTRAVENOUS | Status: AC

## 2018-12-03 MED ORDER — ATORVASTATIN CALCIUM 40 MG PO TABS
80.0000 mg | ORAL_TABLET | Freq: Every day | ORAL | Status: DC
  Administered 2018-12-06 – 2018-12-08 (×3): 80 mg via ORAL
  Filled 2018-12-03 (×4): qty 2

## 2018-12-03 MED ORDER — TAMOXIFEN CITRATE 10 MG PO TABS
20.0000 mg | ORAL_TABLET | Freq: Every day | ORAL | Status: DC
  Administered 2018-12-06 – 2018-12-08 (×3): 20 mg via ORAL
  Filled 2018-12-03 (×5): qty 2

## 2018-12-03 MED ORDER — AMINOCAPROIC ACID 500 MG PO TABS
1.0000 g | ORAL_TABLET | Freq: Three times a day (TID) | ORAL | Status: DC
  Filled 2018-12-03 (×3): qty 2

## 2018-12-03 MED ORDER — SODIUM CHLORIDE 0.9 % IV SOLN
INTRAVENOUS | Status: DC
  Administered 2018-12-03: 14:00:00 via INTRAVENOUS
  Filled 2018-12-03: qty 250

## 2018-12-03 NOTE — ED Notes (Signed)
Patient states she normally goes to [facility] and they know her history better than anybody and she is wanting to be transferred.

## 2018-12-03 NOTE — ED Notes (Signed)
Per Dr Nickolas Madrid, states patient has a history of GI bleeds-states Hgb is 5-states she needs to be scoped, transfused and admitted

## 2018-12-03 NOTE — Telephone Encounter (Signed)
Received call report from [facility].  "Today's Hgb = 5.0."  Reached collaborative nurse with results.

## 2018-12-03 NOTE — ED Notes (Signed)
ED TO INPATIENT HANDOFF REPORT  Name/Age/Gender Peggy House 58 y.o. female  Code Status Code Status History    Date Active Date Inactive Code Status Order ID Comments User Context   10/25/2017 1035 10/27/2017 0355 Full Code [id number]  Alphonzo Grieve, MD ED   10/15/2017 1919 10/22/2017 1928 Full Code [id number]  Velna Ochs, MD Inpatient   10/06/2017 2002 10/10/2017 1932 Full Code [id number]  Ina Homes, MD ED   09/29/2017 1726 10/03/2017 2237 Full Code [id number]  Zada Finders, MD Inpatient   09/22/2017 0122 09/25/2017 1505 Full Code [id number]  Omar Person, NP ED   05/07/2017 1839 05/11/2017 1639 Full Code [id number]  Maryellen Pile, MD Inpatient   04/25/2014 2245 04/28/2014 1817 Full Code [id number]  Ivor Costa, MD Inpatient   04/08/2014 0034 04/12/2014 1934 Full Code [id number]  Berle Mull, MD Inpatient   Advance Care Planning Activity      Home/SNF/Other Home  Chief Complaint GI Bleed  Level of Care/Admitting Diagnosis ED Disposition    ED Disposition Condition [place]: [place] [[id number]]  Level of Care: Telemetry [5]  Admit to tele based on following criteria: Monitor for Ischemic changes  Covid Evaluation: Asymptomatic Screening Protocol (No Symptoms)  Diagnosis: Acute blood loss anemia [761950]  Admitting Physician: Florencia Reasons [[id number]]  Attending Physician: Florencia Reasons [[id number]]  Estimated length of stay: past midnight tomorrow  Certification:: I certify this patient will need inpatient services for at least 2 midnights  PT Class (Do Not Modify): Inpatient [101]  PT Acc Code (Do Not Modify): Private [1]       Medical History Past Medical History:  Diagnosis Date  . Anxiety   . Arthritis    knnes,back  . GERD (gastroesophageal reflux disease)   . 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 History of swelling of feet   . Hyperlipidemia   . Hypertension   . Major depressive disorder, recurrent episode  ([place]) 06/05/2015  . Obesity   . Snores     Allergies Allergies  Allergen Reactions  . Feraheme [Ferumoxytol] Other (See Comments)    Muscle tetany, encephalopathy, fatigue, nausea (reaction to injection)  . Nsaids Other (See Comments)    Stomach bleeding episodes; Tylenol is OK  . Tomato Hives  . Wasp Venom Swelling    IV Location/Drains/Wounds Patient Lines/Drains/Airways Status   Active Line/Drains/Airways    Name:   Placement date:   Placement time:   Site:   Days:   Implanted Port 07/08/18 Right Chest   07/08/18    1434    Chest   148   Incision (Closed) 07/08/18 Throat Right   07/08/18    1449     148          Labs/Imaging Results for orders placed or performed during the hospital encounter of 12/03/18 (from the past 48 hour(s))  Prepare RBC     Status: None   Collection Time: 12/03/18  3:23 PM  Result Value Ref Range   Order Confirmation      BB SAMPLE OR UNITS ALREADY AVAILABLE Performed at [facility], [place] [address] [place], [place] [address]   SARS Coronavirus 2 [facility] order, Performed in [facility] hospital lab) Nasopharyngeal Nasopharyngeal Swab     Status: None   Collection Time: 12/03/18  3:23 PM   Specimen: Nasopharyngeal Swab  Result Value Ref Range   SARS Coronavirus 2 NEGATIVE NEGATIVE    Comment: (NOTE) If result is NEGATIVE  SARS-CoV-2 target nucleic acids are NOT DETECTED. The SARS-CoV-2 RNA is generally detectable in upper and lower  respiratory specimens during the acute phase of infection. The lowest  concentration of SARS-CoV-2 viral copies this assay can detect is 250  copies / mL. A negative result does not preclude SARS-CoV-2 infection  and should not be used as the sole basis for treatment or other  patient management decisions.  A negative result may occur with  improper specimen collection / handling, submission of specimen other  than nasopharyngeal swab, presence of viral mutation(s) within the  areas targeted  by this assay, and inadequate number of viral copies  (<250 copies / mL). A negative result must be combined with clinical  observations, patient history, and epidemiological information. If result is POSITIVE SARS-CoV-2 target nucleic acids are DETECTED. The SARS-CoV-2 RNA is generally detectable in upper and lower  respiratory specimens dur ing the acute phase of infection.  Positive  results are indicative of active infection with SARS-CoV-2.  Clinical  correlation with patient history and other diagnostic information is  necessary to determine patient infection status.  Positive results do  not rule out bacterial infection or co-infection with other viruses. If result is PRESUMPTIVE POSTIVE SARS-CoV-2 nucleic acids MAY BE PRESENT.   A presumptive positive result was obtained on the submitted specimen  and confirmed on repeat testing.  While 2019 novel coronavirus  (SARS-CoV-2) nucleic acids may be present in the submitted sample  additional confirmatory testing may be necessary for epidemiological  and / or clinical management purposes  to differentiate between  SARS-CoV-2 and other Sarbecovirus currently known to infect humans.  If clinically indicated additional testing with an alternate test  methodology [phone]) is advised. The SARS-CoV-2 RNA is generally  detectable in upper and lower respiratory sp ecimens during the acute  phase of infection. The expected result is Negative. Fact Sheet for Patients:  StrictlyIdeas.no Fact Sheet for Healthcare Providers: BankingDealers.co.za This test is not yet approved or cleared by the [place] FDA and has been authorized for detection and/or diagnosis of SARS-CoV-2 by FDA under an Emergency Use Authorization (EUA).  This EUA will remain in effect (meaning this test can be used) for the duration of the COVID-19 declaration under Section 564(b)(1) of the Act, 21 U.S.C. section  360bbb-3(b)(1), unless the authorization is terminated or revoked sooner. Performed at [facility], [place] [address], [place], [place] [address]   POC occult blood, ED Provider will collect     Status: Abnormal   Collection Time: 12/03/18  4:07 PM  Result Value Ref Range   Fecal Occult Bld POSITIVE (A) NEGATIVE   No results found.  Pending Labs [employer] (From admission, onward)    Start     Ordered   Signed and Held  HIV antibody (Routine Testing)  Tomorrow morning,   R     Signed and Held   Signed and Held  CBC  Tomorrow morning,   R     Signed and Held   Signed and Occupational hygienist morning,   R     Signed and Held   Signed and Held  Magnesium  Tomorrow morning,   R     Signed and Held   Signed and Held  Protime-INR  Tomorrow morning,   R     Signed and Held   Signed and Held  Hemoglobin and hematocrit, blood  Once,   R    Comments: One hour  After second unit of prbc transfusion    Signed and Held          Vitals/Pain Today's Vitals   12/03/18 1845 12/03/18 1853 12/03/18 1900 12/03/18 1915  BP: (!) 147/87 (!) 149/87 (!) 168/84 138/90  Pulse: 72 77 78 80  Resp:  (!) 25    Temp:  98.7 F (37.1 C)    TempSrc:  Oral    SpO2: 98% 100% 96% 100%  Weight:      Height:      PainSc:        Isolation Precautions No active isolations  Medications Medications  0.9 %  sodium chloride infusion (Manually program via Guardrails IV Fluids) (has no administration in time range)    Mobility walks

## 2018-12-03 NOTE — ED Provider Notes (Signed)
[place] DEPT Provider Note   CSN: [id number] Arrival date & time: 12/03/18  1446     History   Chief Complaint Chief Complaint  Patient presents with  . Abnormal Lab    HPI Peggy House is a 58 y.o. female.     Pt presents to the ED today with low hemoglobin.  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  Dr. Burr Medico (oncology) has been managing her anemia.  She is followed by Dr. Cristina Gong [facility]).  The pt said she's been getting progressively weak.  She has also seen black stool for the past few weeks.  She was seen at the cancer center today and had blood work done.  She was sent here for admission after hgb came back as 5.      Past Medical History:  Diagnosis Date  . Anxiety   . Arthritis    knnes,back  . GERD (gastroesophageal reflux disease)   . Hereditary hemorrhagic telangiectasia (Peggy House)   . History of swelling of feet   . Hyperlipidemia   . Hypertension   . Major depressive disorder, recurrent episode (Peggy House) 06/05/2015  . Obesity   . Snores     Patient Active Problem List   Diagnosis Date Noted  . Port-A-Cath in place 09/18/2018  . GAD (generalized anxiety disorder) 01/20/2018  . Need for influenza vaccination 01/20/2018  . Upper GI bleed 10/25/2017  . Pulmonary artery hypertension ([place]) 10/19/2017  . Chronic anemia 05/20/2017  . Hereditary hemorrhagic telangiectasia (Peggy House) 05/11/2017  . Venous stasis dermatitis of both lower extremities 05/07/2017  . Recurrent epistaxis 11/29/2016  . Iron deficiency anemia 10/29/2016  . Osteoarthritis, multiple sites 06/05/2015  . Insomnia 06/05/2015  . Major depressive disorder, recurrent episode (Peggy House) 06/05/2015  . Morbid obesity (Peggy House) 04/26/2014  . GI bleed 04/25/2014  . HLD (hyperlipidemia) 04/25/2014  . Leg swelling 04/25/2014  . Essential hypertension   . Acute on chronic blood loss anemia 04/07/2014    Past Surgical  History:  Procedure Laterality Date  . ABDOMINAL HYSTERECTOMY    . CARPAL TUNNEL RELEASE  05/13/2011   Procedure: CARPAL TUNNEL RELEASE;  Surgeon: Nita Sells, MD;  Location: [place];  Service: Orthopedics;  Laterality: Left;  . COLONOSCOPY WITH PROPOFOL N/A 04/28/2014   Procedure: COLONOSCOPY WITH PROPOFOL;  Surgeon: Cleotis Nipper, MD;  Location: [facility] ENDOSCOPY;  Service: Endoscopy;  Laterality: N/A;  . DG TOES*L*  2/10   rt  . DILATION AND CURETTAGE OF UTERUS    . ENTEROSCOPY N/A 10/17/2017   Procedure: ENTEROSCOPY;  Surgeon: Otis Brace, MD;  Location: [place] ENDOSCOPY;  Service: Gastroenterology;  Laterality: N/A;  . ESOPHAGOGASTRODUODENOSCOPY N/A 04/10/2014   Procedure: ESOPHAGOGASTRODUODENOSCOPY (EGD);  Surgeon: [name], MD;  Location: [facility] ENDOSCOPY;  Service: Endoscopy;  Laterality: N/A;  . ESOPHAGOGASTRODUODENOSCOPY N/A 05/10/2017   Procedure: ESOPHAGOGASTRODUODENOSCOPY (EGD);  Surgeon: Ronald Lobo, MD;  Location: [facility] ENDOSCOPY;  Service: Endoscopy;  Laterality: N/A;  . ESOPHAGOGASTRODUODENOSCOPY N/A 09/22/2017   Procedure: ESOPHAGOGASTRODUODENOSCOPY (EGD);  Surgeon: Clarene Essex, MD;  Location: [place];  Service: Endoscopy;  Laterality: N/A;  bedside  . ESOPHAGOGASTRODUODENOSCOPY (EGD) WITH PROPOFOL N/A 04/27/2014   Procedure: ESOPHAGOGASTRODUODENOSCOPY (EGD) WITH PROPOFOL;  Surgeon: Cleotis Nipper, MD;  Location: [place];  Service: Endoscopy;  Laterality: N/A;  possible apc  . ESOPHAGOGASTRODUODENOSCOPY (EGD) WITH PROPOFOL N/A 09/30/2017   Procedure: ESOPHAGOGASTRODUODENOSCOPY (EGD) WITH PROPOFOL;  Surgeon: [name], MD;  Location: [place];  Service: Gastroenterology;  Laterality: N/A;  . ESOPHAGOGASTRODUODENOSCOPY (EGD) WITH PROPOFOL N/A 10/01/2017   Procedure: ESOPHAGOGASTRODUODENOSCOPY (EGD) WITH PROPOFOL;  Surgeon: [name], MD;  Location: [place];  Service: Gastroenterology;  Laterality: N/A;  .  ESOPHAGOGASTRODUODENOSCOPY (EGD) WITH PROPOFOL N/A 10/08/2017   Procedure: ESOPHAGOGASTRODUODENOSCOPY (EGD) WITH PROPOFOL;  Surgeon: Otis Brace, MD;  Location: [facility] ENDOSCOPY;  Service: Gastroenterology;  Laterality: N/A;  . ESOPHAGOGASTRODUODENOSCOPY (EGD) WITH PROPOFOL N/A 10/17/2017   Procedure: ESOPHAGOGASTRODUODENOSCOPY (EGD) WITH PROPOFOL;  Surgeon: Otis Brace, MD;  Location: [facility] ENDOSCOPY;  Service: Gastroenterology;  Laterality: N/A;  . ESOPHAGOGASTRODUODENOSCOPY (EGD) WITH PROPOFOL N/A 10/19/2017   Procedure: ESOPHAGOGASTRODUODENOSCOPY (EGD) WITH PROPOFOL;  Surgeon: Otis Brace, MD;  Location: [facility] ENDOSCOPY;  Service: Gastroenterology;  Laterality: N/A;  . GIVENS CAPSULE STUDY N/A 10/02/2017   Procedure: GIVENS CAPSULE STUDY;  Surgeon: [name], MD;  Location: [place];  Service: Gastroenterology;  Laterality: N/A;  . GIVENS CAPSULE STUDY N/A 10/08/2017   Procedure: GIVENS CAPSULE STUDY;  Surgeon: Otis Brace, MD;  Location: [place];  Service: Gastroenterology;  Laterality: N/A;  endoscopic placement of capsule  . HOT HEMOSTASIS N/A 04/27/2014   Procedure: HOT HEMOSTASIS (ARGON PLASMA COAGULATION/BICAP);  Surgeon: Cleotis Nipper, MD;  Location: [facility] ENDOSCOPY;  Service: Endoscopy;  Laterality: N/A;  . HOT HEMOSTASIS N/A 09/30/2017   Procedure: HOT HEMOSTASIS (ARGON PLASMA COAGULATION/BICAP);  Surgeon: [name], MD;  Location: [place];  Service: Gastroenterology;  Laterality: N/A;  . HOT HEMOSTASIS N/A 10/01/2017   Procedure: HOT HEMOSTASIS (ARGON PLASMA COAGULATION/BICAP);  Surgeon: [name], MD;  Location: [place];  Service: Gastroenterology;  Laterality: N/A;  . HOT HEMOSTASIS N/A 10/17/2017   Procedure: HOT HEMOSTASIS (ARGON PLASMA COAGULATION/BICAP);  Surgeon: Otis Brace, MD;  Location: [facility] ENDOSCOPY;  Service: Gastroenterology;  Laterality: N/A;  . HOT HEMOSTASIS N/A 10/19/2017   Procedure: HOT HEMOSTASIS (ARGON PLASMA COAGULATION/BICAP);   Surgeon: Otis Brace, MD;  Location: [facility] ENDOSCOPY;  Service: Gastroenterology;  Laterality: N/A;  . IR IMAGING GUIDED PORT INSERTION  07/08/2018  . L shoulder Surgery  2011  . SUBMUCOSAL INJECTION  09/22/2017   Procedure: SUBMUCOSAL INJECTION;  Surgeon: Clarene Essex, MD;  Location: [facility] ENDOSCOPY;  Service: Endoscopy;;     OB History   No obstetric history on file.      Home Medications    Prior to Admission medications   Medication Sig Start Date End Date Taking? Authorizing Provider  albuterol (PROVENTIL HFA;VENTOLIN HFA) 108 (90 Base) MCG/ACT inhaler Inhale 1-2 puffs into the lungs every 6 (six) hours as needed for wheezing or shortness of breath. 04/03/17  Yes Molt, Bethany, DO  ALPRAZolam (XANAX) 0.5 MG tablet TAKE 1 TABLET BY MOUTH 2 TIMES A DAY AS NEEDED FOR ANXIETY Patient taking differently: Take 0.5 mg by mouth 2 (two) times daily as needed for anxiety.  12/03/18  Yes Truitt Merle, MD  Aminocaproic Acid (AMICAR) 1000 MG TABS Take 1 tablet (1,000 mg total) by mouth 2 (two) times daily. 09/11/18  Yes Truitt Merle, MD  amLODipine (NORVASC) 10 MG tablet Take 1 tablet (10 mg total) by mouth daily. 08/24/18  Yes Alla Feeling, NP  atorvastatin (LIPITOR) 80 MG tablet TAKE 1 TABLET(80 MG) BY MOUTH DAILY Patient taking differently: Take 80 mg by mouth daily.  08/28/18  Yes Truitt Merle, MD  diphenhydramine-acetaminophen (TYLENOL PM) 25-500 MG TABS tablet Take 1 tablet by mouth at bedtime as needed (sleep).   Yes [provider]  guaifenesin (ROBITUSSIN) 100 MG/5ML syrup Take 200 mg by mouth 3 (three)  times daily as needed for cough or congestion.   Yes [provider]  hydrochlorothiazide (HYDRODIURIL) 12.5 MG tablet TAKE 1 TABLET(12.5 MG) BY MOUTH DAILY Patient taking differently: Take 12.5 mg by mouth daily.  05/01/18  Yes Truitt Merle, MD  pantoprazole (PROTONIX) 40 MG tablet Take 1 tablet (40 mg total) by mouth daily. 10/14/18  Yes Alla Feeling, NP  prochlorperazine (COMPAZINE)  10 MG tablet Take 1 tablet (10 mg total) by mouth every 6 (six) hours as needed for nausea or vomiting. 10/14/18  Yes Alla Feeling, NP  sertraline (ZOLOFT) 50 MG tablet TAKE 1 TABLET(50 MG) BY MOUTH DAILY Patient taking differently: Take 50 mg by mouth daily.  12/03/18  Yes Truitt Merle, MD  tamoxifen (NOLVADEX) 20 MG tablet Take 1 tablet (20 mg total) by mouth daily. 10/14/18  Yes Alla Feeling, NP  ALPRAZolam Duanne Moron) 0.5 MG tablet TAKE 1 TABLET(0.5 MG) BY MOUTH TWICE DAILY AS NEEDED FOR ANXIETY Patient not taking: Reported on 12/03/2018 12/02/18   Alla Feeling, NP  ferrous gluconate (FERGON) 324 MG tablet Take 1 tablet (324 mg total) by mouth 3 (three) times daily with meals. Patient not taking: Reported on 12/03/2018 01/20/18 02/19/18  Kathi Ludwig, MD  lidocaine-prilocaine (EMLA) cream Apply 1 application topically as needed. 07/17/18   Truitt Merle, MD  sertraline (ZOLOFT) 50 MG tablet TAKE 1 TABLET(50 MG) BY MOUTH DAILY Patient not taking: Reported on 12/03/2018 12/02/18   Alla Feeling, NP    Family History Family History  Problem Relation Age of Onset  . Cancer Mother        Ovarian  . Ovarian cancer Mother   . Diabetes Mother   . Kidney disease Mother   . Bleeding Disorder Mother   . Diabetes type II Sister   . Bleeding Disorder Sister   . Diabetes Sister   . Colon cancer Maternal Grandfather   . Crohn's disease Maternal Grandfather   . Stomach cancer Maternal Grandmother   . Kidney disease Son   . Bleeding Disorder Son   . Dysmenorrhea Neg Hx     Social History Social History   Tobacco Use  . Smoking status: Current Every Day Smoker    Packs/day: 0.50    Years: 30.00    Pack years: 15.00    Types: Cigarettes  . Smokeless tobacco: Former Systems developer    Types: Snuff    Quit date: 19  . Tobacco comment: 1/2 PPD  Substance Use Topics  . Alcohol use: No    Alcohol/week: 0.0 standard drinks  . Drug use: No    Comment: last cocaine-2010     Allergies   Feraheme  [ferumoxytol], Nsaids, Tomato, and Wasp venom   Review of Systems Review of Systems  Gastrointestinal:       Black stool  Neurological: Positive for weakness.  All other systems reviewed and are negative.    Physical Exam Updated Vital Signs BP 134/76   Pulse 75   Temp 98.8 F (37.1 C) (Oral)   Resp 19   Ht 5' 5.5" (1.664 m)   Wt 123.8 kg   SpO2 100%   BMI 44.74 kg/m   Physical Exam Vitals signs and nursing note reviewed.  Constitutional:      Appearance: Normal appearance. She is obese.  HENT:     Head: Normocephalic and atraumatic.     Right Ear: External ear normal.     Left Ear: External ear normal.     Nose: Nose normal.  Mouth/Throat:     Mouth: Mucous membranes are moist.     Pharynx: Oropharynx is clear.  Eyes:     Extraocular Movements: Extraocular movements intact.     Conjunctiva/sclera: Conjunctivae normal.     Pupils: Pupils are equal, round, and reactive to light.  Neck:     Musculoskeletal: Normal range of motion and neck supple.  Cardiovascular:     Rate and Rhythm: Normal rate and regular rhythm.     Pulses: Normal pulses.     Heart sounds: Normal heart sounds.  Pulmonary:     Effort: Pulmonary effort is normal.     Breath sounds: Normal breath sounds.  Abdominal:     General: Abdomen is flat. Bowel sounds are normal.     Palpations: Abdomen is soft.  Genitourinary:    Rectum: Guaiac result positive.     Comments: Black melena  Musculoskeletal: Normal range of motion.  Skin:    General: Skin is warm.     Capillary Refill: Capillary refill takes less than 2 seconds.  Neurological:     General: No focal deficit present.     Mental Status: She is alert and oriented to person, place, and time.  Psychiatric:        Mood and Affect: Mood normal.        Behavior: Behavior normal.        Thought Content: Thought content normal.        Judgment: Judgment normal.      ED Treatments / Results  Labs (all labs ordered are listed, but  only abnormal results are displayed) Labs Reviewed  POC OCCULT BLOOD, ED - Abnormal; Notable for the following components:      Result Value   Fecal Occult Bld POSITIVE (*)    All other components within normal limits  SARS CORONAVIRUS 2 (HOSPITAL ORDER, [place] [facility])  PREPARE RBC (CROSSMATCH)    EKG None  Radiology No results found.  Procedures Procedures (including critical care time)  Medications Ordered in ED Medications  0.9 %  sodium chloride infusion (Manually program via Guardrails IV Fluids) (has no administration in time range)     Initial Impression / Assessment and Plan / ED Course  I have reviewed the triage vital signs and the nursing notes.  Pertinent labs & imaging results that were available during my care of the patient were reviewed by me and considered in my medical decision making (see chart for details).    CRITICAL CARE Performed by: Isla Pence   Total critical care time: 30 minutes  Critical care time was exclusive of separately billable procedures and treating other patients.  Critical care was necessary to treat or prevent imminent or life-threatening deterioration.  Critical care was time spent personally by me on the following activities: development of treatment plan with patient and/or surrogate as well as nursing, discussions with consultants, evaluation of patient's response to treatment, examination of patient, obtaining history from patient or surrogate, ordering and performing treatments and interventions, ordering and review of laboratory studies, ordering and review of radiographic studies, pulse oximetry and re-evaluation of patient's condition.    Labs reviewed from earlier today.  2 units of prbcs have been ordered for transfusion.  Pt d/w Dr. Michail Sermon [facility]) who said they will do an EGD tomorrow.  Pt d/w Dr. Erlinda Hong (triad) for admission.  SHALIYAH TAITE was evaluated in Emergency Department on 12/03/2018  for the symptoms described in the history of present illness.  She was evaluated in the context of the global COVID-19 pandemic, which necessitated consideration that the patient might be at risk for infection with the SARS-CoV-2 virus that causes COVID-19. Institutional protocols and algorithms that pertain to the evaluation of patients at risk for COVID-19 are in a state of rapid change based on information released by regulatory bodies including the CDC and federal and state organizations. These policies and algorithms were followed during the patient's care in the ED.  Covid test pending.  It was negative on 10/30/18.  Final Clinical Impressions(s) / ED Diagnoses   Final diagnoses:  Symptomatic anemia  Hereditary hemorrhagic telangiectasia ([place])  Gastrointestinal hemorrhage, unspecified gastrointestinal hemorrhage type    ED Discharge Orders    None       Isla Pence, MD 12/03/18 [phone]

## 2018-12-03 NOTE — Telephone Encounter (Signed)
Pt came in today for lab and iv iron, found to have Hg 5.0. Pt is very fatigued, dyspnea on mild exertion, has melena twice daily for the past week. I saw her briefly, and recommend her to go to ED and be admitted for blood transfusion and inpt GI workup. She has known AVM related GI bleeding, well-known to [facility]. She agrees with the plan. I sent a message to Dr. Michail Sermon, and called [facility] ED also.  Truitt Merle  12/03/2018

## 2018-12-03 NOTE — ED Notes (Signed)
Report received from [facility]

## 2018-12-03 NOTE — ED Notes (Signed)
Dr. Erlinda Hong notified that the patient wanted to be taken to another hospital/[facility].

## 2018-12-03 NOTE — Progress Notes (Signed)
Peggy House   DOB:Feb 01, 1961   BD#:[id number]   STM#:[id number]  Hematology follow up  Subjective: Pt is well-know to me, under my care for HHT related to GI bleeding and iron deficient anemia.  She came in today for routine lab and iron infusion, was found to have hemoglobin 5.0.  She is very fatigued, has been having melena twice daily for the past week, I recommended ED evaluation and admission, for blood transfusion and GI evaluation.   Objective:  Vitals:   12/03/18 2023 12/03/18 2236  BP: 138/66 130/64  Pulse: 78 80  Resp: 20 20  Temp: 98.9 F (37.2 C) 99.7 F (37.6 C)  SpO2: 100% 98%    Body mass index is 44.74 kg/m.  Intake/Output Summary (Last 24 hours) at 12/03/2018 2254 Last data filed at 12/03/2018 1852 Gross per 24 hour  Intake 630 ml  Output -  Net 630 ml     Sclerae unicteric  Oropharynx clear  No peripheral adenopathy  Lungs clear -- no rales or rhonchi  Heart regular rate and rhythm  Abdomen benign  MSK no focal spinal tenderness, no peripheral edema  Neuro nonfocal    CBG (last 3)  No results for input(s): GLUCAP in the last 72 hours.   Labs:  Lab Results  Component Value Date   WBC 9.5 12/03/2018   HGB 5.0 (LL) 12/03/2018   HCT 17.9 (L) 12/03/2018   MCV 92.7 12/03/2018   PLT 215 12/03/2018   NEUTROABS 7.6 12/03/2018    Urine Studies No results for input(s): UHGB, CRYS in the last 72 hours.  Invalid input(s): UACOL, UAPR, USPG, UPH, UTP, UGL, UKET, UBIL, UNIT, UROB, ULEU, UEPI, UWBC, URBC, UBAC, CAST, UCOM, BILUA  Basic Metabolic Panel: Recent Labs  Lab 12/03/18 1222  NA 142  K 3.2*  CL 109  CO2 23  GLUCOSE 140*  BUN 12  CREATININE 0.84  CALCIUM 8.1*   GFR Estimated Creatinine Clearance: 98.5 mL/min (by C-G formula based on SCr of 0.84 mg/dL). Liver Function Tests: Recent Labs  Lab 12/03/18 1222  AST 11*  ALT 7  ALKPHOS 57  BILITOT 0.2*  PROT 6.1*  ALBUMIN 3.0*   No results for input(s): LIPASE, AMYLASE in the last  168 hours. No results for input(s): AMMONIA in the last 168 hours. Coagulation profile No results for input(s): INR, PROTIME in the last 168 hours.  CBC: Recent Labs  Lab 12/03/18 1222  WBC 9.5  NEUTROABS 7.6  HGB 5.0*  HCT 17.9*  MCV 92.7  PLT 215   Cardiac Enzymes: No results for input(s): CKTOTAL, CKMB, CKMBINDEX, TROPONINI in the last 168 hours. BNP: Invalid input(s): POCBNP CBG: No results for input(s): GLUCAP in the last 168 hours. D-Dimer No results for input(s): DDIMER in the last 72 hours. Hgb A1c No results for input(s): HGBA1C in the last 72 hours. Lipid Profile No results for input(s): CHOL, HDL, LDLCALC, TRIG, CHOLHDL, LDLDIRECT in the last 72 hours. Thyroid function studies No results for input(s): TSH, T4TOTAL, T3FREE, THYROIDAB in the last 72 hours.  Invalid input(s): FREET3 Anemia work up Recent Labs    12/03/18 1222  TIBC 276  IRON 19*  RETICCTPCT 8.9*   Microbiology Recent Results (from the past 240 hour(s))  SARS Coronavirus 2 [facility] order, Performed in [facility] hospital lab) Nasopharyngeal Nasopharyngeal Swab     Status: None   Collection Time: 12/03/18  3:23 PM   Specimen: Nasopharyngeal Swab  Result Value Ref Range Status   SARS Coronavirus  2 NEGATIVE NEGATIVE Final    Comment: (NOTE) If result is NEGATIVE SARS-CoV-2 target nucleic acids are NOT DETECTED. The SARS-CoV-2 RNA is generally detectable in upper and lower  respiratory specimens during the acute phase of infection. The lowest  concentration of SARS-CoV-2 viral copies this assay can detect is 250  copies / mL. A negative result does not preclude SARS-CoV-2 infection  and should not be used as the sole basis for treatment or other  patient management decisions.  A negative result may occur with  improper specimen collection / handling, submission of specimen other  than nasopharyngeal swab, presence of viral mutation(s) within the  areas targeted by this assay, and  inadequate number of viral copies  (<250 copies / mL). A negative result must be combined with clinical  observations, patient history, and epidemiological information. If result is POSITIVE SARS-CoV-2 target nucleic acids are DETECTED. The SARS-CoV-2 RNA is generally detectable in upper and lower  respiratory specimens dur ing the acute phase of infection.  Positive  results are indicative of active infection with SARS-CoV-2.  Clinical  correlation with patient history and other diagnostic information is  necessary to determine patient infection status.  Positive results do  not rule out bacterial infection or co-infection with other viruses. If result is PRESUMPTIVE POSTIVE SARS-CoV-2 nucleic acids MAY BE PRESENT.   A presumptive positive result was obtained on the submitted specimen  and confirmed on repeat testing.  While 2019 novel coronavirus  (SARS-CoV-2) nucleic acids may be present in the submitted sample  additional confirmatory testing may be necessary for epidemiological  and / or clinical management purposes  to differentiate between  SARS-CoV-2 and other Sarbecovirus currently known to infect humans.  If clinically indicated additional testing with an alternate test  methodology [phone]) is advised. The SARS-CoV-2 RNA is generally  detectable in upper and lower respiratory sp ecimens during the acute  phase of infection. The expected result is Negative. Fact Sheet for Patients:  StrictlyIdeas.no Fact Sheet for Healthcare Providers: BankingDealers.co.za This test is not yet approved or cleared by the [place] FDA and has been authorized for detection and/or diagnosis of SARS-CoV-2 by FDA under an Emergency Use Authorization (EUA).  This EUA will remain in effect (meaning this test can be used) for the duration of the COVID-19 declaration under Section 564(b)(1) of the Act, 21 U.S.C. section 360bbb-3(b)(1), unless the  authorization is terminated or revoked sooner. Performed at [facility], [place] [address] [place], [place] [address]       Studies:  No results found.  Assessment: 58 y.o. with   1. Severe symptomatic anemia from GI bleeding and iron deficiency 2.  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 Obesity  4.  Anxiety and depression 5. HTN     Plan:  -she had history of severe GI bleeding from HHT, required prolonged multiple hospitalization in 2019, received multiple endoscopical therapy for GI AVM bleeding and gastric arterial embolization at [facility] -blood transfusion to keep Hg>8.0 -I have called GI Dr. Michail Sermon to see her and possible endoscopy tomorrow -if severe GI bleeding, please consider RBC nuclear scan and consult IR -continue Amicar and tamoxifen -will give one dose iv ferric gluconate  -will f/u   Truitt Merle, MD 12/03/2018

## 2018-12-03 NOTE — H&P (Addendum)
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   Chief Complaint: hgb 5, melena, sent from hematology clinic to the ED  HPI: Peggy House is a 58 y.o. female   H/o hereditary hemorrhagic telangiectasia, h/o GI AVMs, sent from hematology clinic to the ED due to hgb 5 and melena. Cbc/bmp was done at the hematology clinic showed hgb 5, k 3.2, cr 0.84, she is sent to the ED for further evaluation. She reports had melena for a few days, she denies ab pain, no chest pain, no dizziness, does reports feeling tired and have sob   ED course: vital signs are stable. EDP ordered two units of blood, [facility] consulted, plan to do EGD in am, hospitalist called to admit the patient.  Review of Systems:  Detail per HPI, Review of systems are otherwise negative  Past Medical History:  Diagnosis Date  . Anxiety   . Arthritis    knnes,back  . GERD (gastroesophageal reflux disease)   . Hereditary hemorrhagic telangiectasia ([place])   . History of swelling of feet   . Hyperlipidemia   . Hypertension   . Major depressive disorder, recurrent episode ([place]) 06/05/2015  . Obesity   . Snores    Past Surgical History:  Procedure Laterality Date  . ABDOMINAL HYSTERECTOMY    . CARPAL TUNNEL RELEASE  05/13/2011   Procedure: CARPAL TUNNEL RELEASE;  Surgeon: Nita Sells, MD;  Location: [place];  Service: Orthopedics;  Laterality: Left;  . COLONOSCOPY WITH PROPOFOL N/A 04/28/2014   Procedure: COLONOSCOPY WITH PROPOFOL;  Surgeon: Cleotis Nipper, MD;  Location: [facility] ENDOSCOPY;  Service: Endoscopy;  Laterality: N/A;  . DG TOES*L*  2/10   rt  . DILATION AND CURETTAGE OF UTERUS    . ENTEROSCOPY N/A 10/17/2017   Procedure: ENTEROSCOPY;  Surgeon: Otis Brace, MD;  Location: [facility] ENDOSCOPY;  Service: Gastroenterology;  Laterality: N/A;  .  ESOPHAGOGASTRODUODENOSCOPY N/A 04/10/2014   Procedure: ESOPHAGOGASTRODUODENOSCOPY (EGD);  Surgeon: [name], MD;  Location: [facility] ENDOSCOPY;  Service: Endoscopy;  Laterality: N/A;  . ESOPHAGOGASTRODUODENOSCOPY N/A 05/10/2017   Procedure: ESOPHAGOGASTRODUODENOSCOPY (EGD);  Surgeon: Ronald Lobo, MD;  Location: [facility] ENDOSCOPY;  Service: Endoscopy;  Laterality: N/A;  . ESOPHAGOGASTRODUODENOSCOPY N/A 09/22/2017   Procedure: ESOPHAGOGASTRODUODENOSCOPY (EGD);  Surgeon: Clarene Essex, MD;  Location: [place];  Service: Endoscopy;  Laterality: N/A;  bedside  . ESOPHAGOGASTRODUODENOSCOPY (EGD) WITH PROPOFOL N/A 04/27/2014   Procedure: ESOPHAGOGASTRODUODENOSCOPY (EGD) WITH PROPOFOL;  Surgeon: Cleotis Nipper, MD;  Location: [place];  Service: Endoscopy;  Laterality: N/A;  possible apc  . ESOPHAGOGASTRODUODENOSCOPY (EGD) WITH PROPOFOL N/A 09/30/2017   Procedure: ESOPHAGOGASTRODUODENOSCOPY (EGD) WITH PROPOFOL;  Surgeon: [name], MD;  Location: [place];  Service: Gastroenterology;  Laterality: N/A;  . ESOPHAGOGASTRODUODENOSCOPY (EGD) WITH PROPOFOL N/A 10/01/2017   Procedure: ESOPHAGOGASTRODUODENOSCOPY (EGD) WITH PROPOFOL;  Surgeon: [name], MD;  Location: [place];  Service: Gastroenterology;  Laterality: N/A;  . ESOPHAGOGASTRODUODENOSCOPY (EGD) WITH PROPOFOL N/A 10/08/2017   Procedure: ESOPHAGOGASTRODUODENOSCOPY (EGD) WITH PROPOFOL;  Surgeon: Otis Brace, MD;  Location: [facility] ENDOSCOPY;  Service: Gastroenterology;  Laterality: N/A;  . ESOPHAGOGASTRODUODENOSCOPY (EGD) WITH PROPOFOL N/A 10/17/2017   Procedure: ESOPHAGOGASTRODUODENOSCOPY (EGD) WITH PROPOFOL;  Surgeon: Otis Brace, MD;  Location: [facility] ENDOSCOPY;  Service: Gastroenterology;  Laterality: N/A;  . ESOPHAGOGASTRODUODENOSCOPY (EGD) WITH PROPOFOL N/A 10/19/2017   Procedure: ESOPHAGOGASTRODUODENOSCOPY (EGD) WITH PROPOFOL;  Surgeon:  Otis Brace, MD;  Location: [place];  Service: Gastroenterology;  Laterality: N/A;   . GIVENS CAPSULE STUDY N/A 10/02/2017   Procedure: GIVENS CAPSULE STUDY;  Surgeon: [name], MD;  Location: [place];  Service: Gastroenterology;  Laterality: N/A;  . GIVENS CAPSULE STUDY N/A 10/08/2017   Procedure: GIVENS CAPSULE STUDY;  Surgeon: Otis Brace, MD;  Location: [place];  Service: Gastroenterology;  Laterality: N/A;  endoscopic placement of capsule  . HOT HEMOSTASIS N/A 04/27/2014   Procedure: HOT HEMOSTASIS (ARGON PLASMA COAGULATION/BICAP);  Surgeon: Cleotis Nipper, MD;  Location: [facility] ENDOSCOPY;  Service: Endoscopy;  Laterality: N/A;  . HOT HEMOSTASIS N/A 09/30/2017   Procedure: HOT HEMOSTASIS (ARGON PLASMA COAGULATION/BICAP);  Surgeon: [name], MD;  Location: [place];  Service: Gastroenterology;  Laterality: N/A;  . HOT HEMOSTASIS N/A 10/01/2017   Procedure: HOT HEMOSTASIS (ARGON PLASMA COAGULATION/BICAP);  Surgeon: [name], MD;  Location: [place];  Service: Gastroenterology;  Laterality: N/A;  . HOT HEMOSTASIS N/A 10/17/2017   Procedure: HOT HEMOSTASIS (ARGON PLASMA COAGULATION/BICAP);  Surgeon: Otis Brace, MD;  Location: [facility] ENDOSCOPY;  Service: Gastroenterology;  Laterality: N/A;  . HOT HEMOSTASIS N/A 10/19/2017   Procedure: HOT HEMOSTASIS (ARGON PLASMA COAGULATION/BICAP);  Surgeon: Otis Brace, MD;  Location: [facility] ENDOSCOPY;  Service: Gastroenterology;  Laterality: N/A;  . IR IMAGING GUIDED PORT INSERTION  07/08/2018  . L shoulder Surgery  2011  . SUBMUCOSAL INJECTION  09/22/2017   Procedure: SUBMUCOSAL INJECTION;  Surgeon: Clarene Essex, MD;  Location: [place];  Service: Endoscopy;;   Social History:  reports that she has been smoking cigarettes. She has a 15.00 pack-year smoking history. She quit smokeless tobacco use about 39 years ago.  Her smokeless tobacco use included snuff. She reports that she does not drink alcohol or use drugs. Patient lives at home & is able to participate in activities of daily living independently    Allergies  Allergen Reactions  . Feraheme [Ferumoxytol] Other (See Comments)    Muscle tetany, encephalopathy, fatigue, nausea (reaction to injection)  . Nsaids Other (See Comments)    Stomach bleeding episodes; Tylenol is OK  . Tomato Hives  . Wasp Venom Swelling    Family History  Problem Relation Age of Onset  . Cancer Mother        Ovarian  . Ovarian cancer Mother   . Diabetes Mother   . Kidney disease Mother   . Bleeding Disorder Mother   . Diabetes type II Sister   . Bleeding Disorder Sister   . Diabetes Sister   . Colon cancer Maternal Grandfather   . Crohn's disease Maternal Grandfather   . Stomach cancer Maternal Grandmother   . Kidney disease Son   . Bleeding Disorder Son   . Dysmenorrhea Neg Hx       Prior to Admission medications   Medication Sig Start Date End Date Taking? Authorizing Provider  albuterol (PROVENTIL HFA;VENTOLIN HFA) 108 (90 Base) MCG/ACT inhaler Inhale 1-2 puffs into the lungs every 6 (six) hours as needed for wheezing or shortness of breath. 04/03/17  Yes Molt, Bethany, DO  ALPRAZolam (XANAX) 0.5 MG tablet TAKE 1 TABLET BY MOUTH 2 TIMES A DAY AS NEEDED FOR ANXIETY Patient taking differently: Take 0.5 mg by mouth 2 (two) times daily as needed for anxiety.  12/03/18  Yes Truitt Merle, MD  Aminocaproic Acid (AMICAR) 1000 MG TABS Take 1 tablet (1,000 mg total) by mouth 2 (two) times daily. 09/11/18  Yes Truitt Merle, MD  amLODipine (NORVASC) 10 MG tablet  Take 1 tablet (10 mg total) by mouth daily. 08/24/18  Yes Alla Feeling, NP  atorvastatin (LIPITOR) 80 MG tablet TAKE 1 TABLET(80 MG) BY MOUTH DAILY Patient taking differently: Take 80 mg by mouth daily.  08/28/18  Yes Truitt Merle, MD  diphenhydramine-acetaminophen (TYLENOL PM) 25-500 MG TABS tablet Take 1 tablet by mouth at bedtime as needed (sleep).   Yes [provider]  guaifenesin (ROBITUSSIN) 100 MG/5ML syrup Take 200 mg by mouth 3 (three) times daily as needed for cough or congestion.   Yes  [provider]  hydrochlorothiazide (HYDRODIURIL) 12.5 MG tablet TAKE 1 TABLET(12.5 MG) BY MOUTH DAILY Patient taking differently: Take 12.5 mg by mouth daily.  05/01/18  Yes Truitt Merle, MD  pantoprazole (PROTONIX) 40 MG tablet Take 1 tablet (40 mg total) by mouth daily. 10/14/18  Yes Alla Feeling, NP  prochlorperazine (COMPAZINE) 10 MG tablet Take 1 tablet (10 mg total) by mouth every 6 (six) hours as needed for nausea or vomiting. 10/14/18  Yes Alla Feeling, NP  sertraline (ZOLOFT) 50 MG tablet TAKE 1 TABLET(50 MG) BY MOUTH DAILY Patient taking differently: Take 50 mg by mouth daily.  12/03/18  Yes Truitt Merle, MD  tamoxifen (NOLVADEX) 20 MG tablet Take 1 tablet (20 mg total) by mouth daily. 10/14/18  Yes Alla Feeling, NP  ALPRAZolam Duanne Moron) 0.5 MG tablet TAKE 1 TABLET(0.5 MG) BY MOUTH TWICE DAILY AS NEEDED FOR ANXIETY Patient not taking: Reported on 12/03/2018 12/02/18   Alla Feeling, NP  ferrous gluconate (FERGON) 324 MG tablet Take 1 tablet (324 mg total) by mouth 3 (three) times daily with meals. Patient not taking: Reported on 12/03/2018 01/20/18 02/19/18  Kathi Ludwig, MD  lidocaine-prilocaine (EMLA) cream Apply 1 application topically as needed. 07/17/18   Truitt Merle, MD  sertraline (ZOLOFT) 50 MG tablet TAKE 1 TABLET(50 MG) BY MOUTH DAILY Patient not taking: Reported on 12/03/2018 12/02/18   Alla Feeling, NP    Physical Exam: BP 134/76   Pulse 75   Temp 98.8 F (37.1 C) (Oral)   Resp 19   Ht 5' 5.5" (1.664 m)   Wt 123.8 kg   SpO2 100%   BMI 44.74 kg/m   . General:  NAD, obese  . Eyes: PERRL . ENT: unremarkable . Neck: supple, no JVD . Cardiovascular: RRR . Respiratory: CTABL, mediport to right chest  . Abdomen: soft/NT/ND, positive bowel sounds . Skin: no rash . Musculoskeletal:  No edema . Psychiatric: calm/cooperative . Neurologic: no focal findings            Labs on Admission:  Basic Metabolic Panel: Recent Labs  Lab 12/03/18 1222  NA 142  K  3.2*  CL 109  CO2 23  GLUCOSE 140*  BUN 12  CREATININE 0.84  CALCIUM 8.1*   Liver Function Tests: Recent Labs  Lab 12/03/18 1222  AST 11*  ALT 7  ALKPHOS 57  BILITOT 0.2*  PROT 6.1*  ALBUMIN 3.0*   No results for input(s): LIPASE, AMYLASE in the last 168 hours. No results for input(s): AMMONIA in the last 168 hours. CBC: Recent Labs  Lab 12/03/18 1222  WBC 9.5  NEUTROABS 7.6  HGB 5.0*  HCT 17.9*  MCV 92.7  PLT 215   Cardiac Enzymes: No results for input(s): CKTOTAL, CKMB, CKMBINDEX, TROPONINI in the last 168 hours.  BNP (last 3 results) No results for input(s): BNP in the last 8760 hours.  ProBNP (last 3 results) No results for input(s):  PROBNP in the last 8760 hours.  CBG: No results for input(s): GLUCAP in the last 168 hours.  Radiological Exams on Admission: No results found.  EKG: Independently reviewed. *  Assessment/Plan Present on Admission: **None**  Melena/acute blood loss anemia/iron deficiency anemia -H/o GI AVM , h/o double balloon enteroscopy in the past -prbc transfusion x2untis  -hemodynamically stable, will keep on tele due to significant anemia -GI Dr Michail Sermon consulted, npo aftermidnight, scope in am   Hereditary hemorrhagic telangiectasia -home regimen: amicar, tamoxifen, avastin ( getting from [facility]) , iron supplement -hematology oncology Dr Burr Medico consulted, will follow recommendation  Hypokalemia: hold hctz, replace k, check mag  HTN:  on norvasc, hctz  at home, will hold for now in the setting of gi bleed/ anemia, hydralazine prn for sbp > 170  Class III obesity: Body mass index is 44.74 kg/m.  DVT prophylaxis: scd's  Consultants:  [facility] Dr Michail Sermon Hematology /oncology Dr Burr Medico  Code Status: full   Family Communication:  Patient   Disposition Plan: admit to med tele due to severe anemia  Time spent: 33mins  Florencia Reasons MD, PhD, FACP [facility] Pager [phone] If 7PM-7AM, please contact night-coverage  at [email or web address], password [facility]

## 2018-12-03 NOTE — Progress Notes (Signed)
Patient did not receive treatment today due to Heme of 5.  Patient was taken to the ED room 15. Report was given to [facility].

## 2018-12-03 NOTE — ED Notes (Signed)
Blood bank said blood is ready informed Shelia,RN.

## 2018-12-03 NOTE — ED Triage Notes (Signed)
Received report from [place] that the patient's Hgb-5.0. Patient did not receiver her iron transfusion today.

## 2018-12-04 ENCOUNTER — Inpatient Hospital Stay (HOSPITAL_COMMUNITY): Payer: Medicaid Other

## 2018-12-04 ENCOUNTER — Encounter (HOSPITAL_COMMUNITY): Payer: Self-pay | Admitting: *Deleted

## 2018-12-04 ENCOUNTER — Encounter (HOSPITAL_COMMUNITY)
Admission: EM | Disposition: A | Discharge status: Home / Self Care | Payer: Self-pay | Source: Home / Self Care | Attending: Internal Medicine

## 2018-12-04 ENCOUNTER — Inpatient Hospital Stay (HOSPITAL_COMMUNITY): Payer: Medicaid Other | Admitting: Certified Registered"

## 2018-12-04 DIAGNOSIS — D649 Anemia, unspecified: Secondary | ICD-10-CM

## 2018-12-04 DIAGNOSIS — K920 Hematemesis: Secondary | ICD-10-CM

## 2018-12-04 DIAGNOSIS — J9600 Acute respiratory failure, unspecified whether with hypoxia or hypercapnia: Secondary | ICD-10-CM

## 2018-12-04 DIAGNOSIS — M1990 Unspecified osteoarthritis, unspecified site: Secondary | ICD-10-CM | POA: Diagnosis not present

## 2018-12-04 HISTORY — PX: ESOPHAGOGASTRODUODENOSCOPY (EGD) WITH PROPOFOL: SHX5813

## 2018-12-04 HISTORY — PX: SUBMUCOSAL INJECTION: SHX5543

## 2018-12-04 HISTORY — PX: HEMOSTASIS CLIP PLACEMENT: SHX6857

## 2018-12-04 LAB — CBC
HCT: 23.1 % — ABNORMAL LOW (ref 36.0–46.0)
HCT: 31.6 % — ABNORMAL LOW (ref 36.0–46.0)
Hemoglobin: 6.7 g/dL — CL (ref 12.0–15.0)
Hemoglobin: 9.7 g/dL — ABNORMAL LOW (ref 12.0–15.0)
MCH: 26.4 pg (ref 26.0–34.0)
MCH: 28.3 pg (ref 26.0–34.0)
MCHC: 29 g/dL — ABNORMAL LOW (ref 30.0–36.0)
MCHC: 30.7 g/dL (ref 30.0–36.0)
MCV: 90.9 fL (ref 80.0–100.0)
MCV: 92.1 fL (ref 80.0–100.0)
Platelets: 207 10*3/uL (ref 150–400)
Platelets: 206 10*3/uL (ref 150–400)
RBC: 2.54 MIL/uL — ABNORMAL LOW (ref 3.87–5.11)
RBC: 3.43 MIL/uL — ABNORMAL LOW (ref 3.87–5.11)
RDW: 21 % — ABNORMAL HIGH (ref 11.5–15.5)
RDW: 18.4 % — ABNORMAL HIGH (ref 11.5–15.5)
WBC: 9.8 10*3/uL (ref 4.0–10.5)
WBC: 19.8 10*3/uL — ABNORMAL HIGH (ref 4.0–10.5)
nRBC: 0 % (ref 0.0–0.2)
nRBC: 0 % (ref 0.0–0.2)

## 2018-12-04 LAB — COMPREHENSIVE METABOLIC PANEL
ALT: 14 U/L (ref 0–44)
AST: 22 U/L (ref 15–41)
Albumin: 3 g/dL — ABNORMAL LOW (ref 3.5–5.0)
Alkaline Phosphatase: 51 U/L (ref 38–126)
Anion gap: 8 (ref 5–15)
BUN: 12 mg/dL (ref 6–20)
CO2: 23 mmol/L (ref 22–32)
Calcium: 7.9 mg/dL — ABNORMAL LOW (ref 8.9–10.3)
Chloride: 110 mmol/L (ref 98–111)
Creatinine, Ser: 0.88 mg/dL (ref 0.44–1.00)
GFR calc Af Amer: >60 mL/min (ref >60)
GFR calc non Af Amer: >60 mL/min (ref >60)
Glucose, Bld: 178 mg/dL — ABNORMAL HIGH (ref 70–99)
Potassium: 3.8 mmol/L (ref 3.5–5.1)
Sodium: 141 mmol/L (ref 135–145)
Total Bilirubin: 1.1 mg/dL (ref 0.3–1.2)
Total Protein: 6.2 g/dL — ABNORMAL LOW (ref 6.5–8.1)

## 2018-12-04 LAB — TYPE AND SCREEN
ABO/RH(D): A NEG
Antibody Screen: NEGATIVE
Unit division: 0
Unit division: 0

## 2018-12-04 LAB — BPAM RBC
Blood Product Expiration Date: 202009052359
Blood Product Expiration Date: 202009052359
ISSUE DATE / TIME: 202008201602
ISSUE DATE / TIME: 202008202246
Unit Type and Rh: 600
Unit Type and Rh: 600

## 2018-12-04 LAB — BLOOD GAS, ARTERIAL
Acid-base deficit: 4.5 mmol/L — ABNORMAL HIGH (ref 0.0–2.0)
Acid-base deficit: 3.3 mmol/L — ABNORMAL HIGH (ref 0.0–2.0)
Bicarbonate: 23.9 mmol/L (ref 20.0–28.0)
Bicarbonate: 23.8 mmol/L (ref 20.0–28.0)
Drawn by: 336831
Drawn by: 514251
FIO2: 100
FIO2: 80
MECHVT: 450 mL
MECHVT: 500 mL
O2 Saturation: 98.7 %
O2 Saturation: 99.3 %
PEEP: 5 cmH2O
PEEP: 5 cmH2O
Patient temperature: 98.6
Patient temperature: 97.6
RATE: 18 resp/min
RATE: 20 resp/min
pCO2 arterial: 64.8 mmHg — ABNORMAL HIGH (ref 32.0–48.0)
pCO2 arterial: 55.4 mmHg — ABNORMAL HIGH (ref 32.0–48.0)
pH, Arterial: 7.192 — CL (ref 7.350–7.450)
pH, Arterial: 7.253 — ABNORMAL LOW (ref 7.350–7.450)
pO2, Arterial: 157 mmHg — ABNORMAL HIGH (ref 83.0–108.0)
pO2, Arterial: 184 mmHg — ABNORMAL HIGH (ref 83.0–108.0)

## 2018-12-04 LAB — PROTIME-INR
INR: 1 (ref 0.8–1.2)
Prothrombin Time: 13.4 seconds (ref 11.4–15.2)

## 2018-12-04 LAB — BASIC METABOLIC PANEL
Anion gap: 8 (ref 5–15)
BUN: 11 mg/dL (ref 6–20)
CO2: 25 mmol/L (ref 22–32)
Calcium: 8.4 mg/dL — ABNORMAL LOW (ref 8.9–10.3)
Chloride: 110 mmol/L (ref 98–111)
Creatinine, Ser: 0.74 mg/dL (ref 0.44–1.00)
GFR calc Af Amer: >60 mL/min (ref >60)
GFR calc non Af Amer: >60 mL/min (ref >60)
Glucose, Bld: 122 mg/dL — ABNORMAL HIGH (ref 70–99)
Potassium: 3.5 mmol/L (ref 3.5–5.1)
Sodium: 143 mmol/L (ref 135–145)

## 2018-12-04 LAB — MAGNESIUM: Magnesium: 2.1 mg/dL (ref 1.7–2.4)

## 2018-12-04 LAB — PREPARE RBC (CROSSMATCH)

## 2018-12-04 LAB — HEMOGLOBIN AND HEMATOCRIT, BLOOD
HCT: 22.8 % — ABNORMAL LOW (ref 36.0–46.0)
Hemoglobin: 6.7 g/dL — CL (ref 12.0–15.0)

## 2018-12-04 LAB — TRIGLYCERIDES: Triglycerides: 233 mg/dL — ABNORMAL HIGH (ref <150)

## 2018-12-04 LAB — FERRITIN: Ferritin: 89 ng/mL (ref 11–307)

## 2018-12-04 SURGERY — ESOPHAGOGASTRODUODENOSCOPY (EGD) WITH PROPOFOL
Anesthesia: General

## 2018-12-04 MED ORDER — AMINOCAPROIC ACID 500 MG PO TABS
1.0000 g | ORAL_TABLET | Freq: Three times a day (TID) | ORAL | Status: DC
  Administered 2018-12-05 – 2018-12-08 (×9): 1 g via ORAL
  Filled 2018-12-04 (×10): qty 2

## 2018-12-04 MED ORDER — SODIUM CHLORIDE 0.9 % IV SOLN
INTRAVENOUS | Status: DC | PRN
  Administered 2018-12-04: 13:00:00 via INTRAVENOUS

## 2018-12-04 MED ORDER — EPINEPHRINE 1 MG/10ML IJ SOSY
PREFILLED_SYRINGE | INTRAMUSCULAR | Status: AC
  Filled 2018-12-04: qty 10

## 2018-12-04 MED ORDER — PROPOFOL 1000 MG/100ML IV EMUL
INTRAVENOUS | Status: AC
  Filled 2018-12-04: qty 100

## 2018-12-04 MED ORDER — GLYCOPYRROLATE PF 0.2 MG/ML IJ SOSY
PREFILLED_SYRINGE | INTRAMUSCULAR | Status: DC | PRN
  Administered 2018-12-04: .1 mg via INTRAVENOUS

## 2018-12-04 MED ORDER — SODIUM CHLORIDE 0.9% IV SOLUTION: Freq: Once | INTRAVENOUS | Status: AC

## 2018-12-04 MED ORDER — SODIUM CHLORIDE 0.9 % IV SOLN
80.0000 mg | Freq: Once | INTRAVENOUS | Status: AC
  Administered 2018-12-04: 80 mg via INTRAVENOUS
  Filled 2018-12-04: qty 80

## 2018-12-04 MED ORDER — PROPOFOL 10 MG/ML IV BOLUS
INTRAVENOUS | Status: DC | PRN
  Administered 2018-12-04: 10 mg via INTRAVENOUS
  Administered 2018-12-04: 20 mg via INTRAVENOUS
  Administered 2018-12-04: 10 mg via INTRAVENOUS
  Administered 2018-12-04: 20 mg via INTRAVENOUS

## 2018-12-04 MED ORDER — PROPOFOL 10 MG/ML IV BOLUS
INTRAVENOUS | Status: AC
  Filled 2018-12-04: qty 80

## 2018-12-04 MED ORDER — SODIUM CHLORIDE 0.9 % IV SOLN
8.0000 mg/h | INTRAVENOUS | Status: AC
  Administered 2018-12-04 – 2018-12-07 (×7): 8 mg/h via INTRAVENOUS
  Filled 2018-12-04 (×9): qty 80

## 2018-12-04 MED ORDER — PANTOPRAZOLE SODIUM 40 MG IV SOLR: 40.0000 mg | Freq: Two times a day (BID) | INTRAVENOUS | Status: DC

## 2018-12-04 MED ORDER — SODIUM CHLORIDE 0.9 % IV SOLN
INTRAVENOUS | Status: DC | PRN
  Administered 2018-12-04: 17:00:00 250 mL via INTRAVENOUS

## 2018-12-04 MED ORDER — PROPOFOL 500 MG/50ML IV EMUL
INTRAVENOUS | Status: DC | PRN
  Administered 2018-12-04: 110 ug/kg/min via INTRAVENOUS

## 2018-12-04 MED ORDER — LORAZEPAM 2 MG/ML IJ SOLN
INTRAMUSCULAR | Status: AC
  Filled 2018-12-04: qty 1

## 2018-12-04 MED ORDER — LACTATED RINGERS IV SOLN: INTRAVENOUS | Status: DC

## 2018-12-04 MED ORDER — FENTANYL 2500MCG IN NS 250ML (10MCG/ML) PREMIX INFUSION
0.0000 ug/h | INTRAVENOUS | Status: DC
  Filled 2018-12-04: qty 250

## 2018-12-04 MED ORDER — ESMOLOL HCL 100 MG/10ML IV SOLN
INTRAVENOUS | Status: DC | PRN
  Administered 2018-12-04: 50 mg via INTRAVENOUS

## 2018-12-04 MED ORDER — SODIUM CHLORIDE (PF) 0.9 % IJ SOLN
PREFILLED_SYRINGE | INTRAMUSCULAR | Status: DC | PRN
  Administered 2018-12-04: 9 mL

## 2018-12-04 MED ORDER — PROPOFOL 1000 MG/100ML IV EMUL
5.0000 ug/kg/min | INTRAVENOUS | Status: DC
  Administered 2018-12-04: 16:00:00 5 ug/kg/min via INTRAVENOUS

## 2018-12-04 MED ORDER — SUCCINYLCHOLINE CHLORIDE 200 MG/10ML IV SOSY
PREFILLED_SYRINGE | INTRAVENOUS | Status: DC | PRN
  Administered 2018-12-04: 100 mg via INTRAVENOUS

## 2018-12-04 MED ORDER — ONDANSETRON HCL 4 MG/2ML IJ SOLN
INTRAMUSCULAR | Status: DC | PRN
  Administered 2018-12-04: 4 mg via INTRAVENOUS

## 2018-12-04 MED ORDER — FENTANYL CITRATE (PF) 100 MCG/2ML IJ SOLN
INTRAMUSCULAR | Status: AC
  Filled 2018-12-04: qty 2

## 2018-12-04 MED ORDER — LORAZEPAM 2 MG/ML IJ SOLN: 2.0000 mg | Freq: Once | INTRAMUSCULAR | Status: DC

## 2018-12-04 MED ORDER — FENTANYL BOLUS VIA INFUSION
50.0000 ug | INTRAVENOUS | Status: DC | PRN
  Administered 2018-12-04 (×2): 50 ug via INTRAVENOUS
  Filled 2018-12-04: qty 50

## 2018-12-04 MED ORDER — FENTANYL CITRATE (PF) 100 MCG/2ML IJ SOLN
INTRAMUSCULAR | Status: DC | PRN
  Administered 2018-12-04 (×2): 100 ug via INTRAVENOUS

## 2018-12-04 MED ORDER — SODIUM CHLORIDE 0.9 % IV SOLN
5.0000 g | Freq: Three times a day (TID) | INTRAVENOUS | Status: AC
  Administered 2018-12-04 – 2018-12-05 (×3): 5 g via INTRAVENOUS
  Filled 2018-12-04 (×4): qty 20

## 2018-12-04 MED ORDER — LIDOCAINE 2% (20 MG/ML) 5 ML SYRINGE
INTRAMUSCULAR | Status: DC | PRN
  Administered 2018-12-04: 100 mg via INTRAVENOUS

## 2018-12-04 MED ORDER — DEXAMETHASONE SODIUM PHOSPHATE 10 MG/ML IJ SOLN
INTRAMUSCULAR | Status: DC | PRN
  Administered 2018-12-04: 10 mg via INTRAVENOUS

## 2018-12-04 MED ORDER — SODIUM CHLORIDE 0.9 % IV SOLN: INTRAVENOUS | Status: DC

## 2018-12-04 MED ORDER — LACTATED RINGERS IV SOLN
INTRAVENOUS | Status: DC
  Administered 2018-12-04: 13:00:00 1000 mL via INTRAVENOUS
  Administered 2018-12-05: 13:00:00 via INTRAVENOUS

## 2018-12-04 MED ORDER — FENTANYL 2500MCG IN NS 250ML (10MCG/ML) PREMIX INFUSION
50.0000 ug/h | INTRAVENOUS | Status: DC
  Administered 2018-12-04 – 2018-12-05 (×3): 50 ug/h via INTRAVENOUS

## 2018-12-04 MED ORDER — FENTANYL CITRATE (PF) 100 MCG/2ML IJ SOLN: 50.0000 ug | Freq: Once | INTRAMUSCULAR | Status: DC

## 2018-12-04 MED ORDER — ROCURONIUM BROMIDE 10 MG/ML (PF) SYRINGE
PREFILLED_SYRINGE | INTRAVENOUS | Status: DC | PRN
  Administered 2018-12-04: 100 mg via INTRAVENOUS

## 2018-12-04 SURGICAL SUPPLY — 15 items

## 2018-12-04 NOTE — Progress Notes (Signed)
CRITICAL VALUE ALERT  Critical Value:  PH 7.1  Date & Time Notied:  12/04/18 1630  Provider Notified: Dr. Ander Slade  Orders Received/Actions taken: Dr. Ander Slade adjusted ventilator RR to 20, TV to 500

## 2018-12-04 NOTE — Consult Note (Addendum)
NAME:  Peggy House, MRN:  [id number], DOB:  October 10, 1960, LOS: 1 ADMISSION DATE:  12/03/2018, CONSULTATION DATE:  12/04/2018  REFERRING MD:  Dr Erlinda Hong, CHIEF COMPLAINT:  Gi bleed, respiratory failure   Brief History   Admitted 8/20, sent in from the hematology clinic for severe anemia Taken to the endoscopy suite where she had EGD-significant for dieulafoy lesions-she did have clippings and injections Required to be intubated for airway protection with significant expectoration of blood  History of present illness   She has a history of hemorrhagic telangiectasia, history of GI AVMs Noted to have a hemoglobin of 5 Had melena for a few days prior to presentation to the clinic-hematology  Past Medical History   Past Medical History:  Diagnosis Date  . Anxiety   . Arthritis    knnes,back  . GERD (gastroesophageal reflux disease)   . 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 History of swelling of feet   . Hyperlipidemia   . Hypertension   . Major depressive disorder, recurrent episode ([place] [facility]) 06/05/2015  . Obesity   . Snores     Significant Hospital Events   Endoscopy 12/04/2018  Consults:  GI 12/04/2018  Procedures:  EGD 12/04/2018 -Procedure note  Significant Diagnostic Tests:    Micro Data:  None  Antimicrobials:  None  Interim history/subjective:  On ventilator, comfortable  Objective   Blood pressure 140/69, pulse (!) 108, temperature 98.5 F (36.9 C), temperature source Oral, resp. rate 18, height 5\' 5"  (1.651 m), weight 123.8 kg, SpO2 99 %.    Vent Mode: PRVC FiO2 (%):  [100 %] 100 % Set Rate:  [18 bmp] 18 bmp Vt Set:  [450 mL] 450 mL PEEP:  [5 cmH20] 5 cmH20 Plateau Pressure:  [29 cmH20] 29 cmH20   Intake/Output Summary (Last 24 hours) at 12/04/2018 1607 Last data filed at 12/04/2018 1519 Gross per 24 hour  Intake 2678 ml  Output 100 ml  Net 2578 ml   Filed Weights   12/03/18 1508 12/04/18 1301  Weight: 123.8 kg 123.8 kg    Examination:  General: Middle-aged lady, obese, unresponsive HENT: Moist oral mucosa, endotracheal tube in place Lungs: Coarse breath sounds Cardiovascular: S1-S2 appreciated Abdomen: Soft Extremities: No clubbing Neuro: Paralyzed   Resolved Hospital Problem list     Assessment & Plan:  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 -Post endoscopy -Post clipping -PPI drip -Post blood transfusions  Respiratory failure -Intubated for airway protection -Sedation-propofol and fentanyl -Weaning as tolerated -Goal is to have on the ventilator overnight and then attempt liberation from the ventilator 12/05/2018  History of hereditary hemorrhagic telangiectasia -Hematology following -Home regimen included Benicar, tamoxifen, Avastin, iron supplementation  Hypertension -Hydralazine PRN -Hold home maintenance medications    Best practice:  Diet: N.p.o. Pain/Anxiety/Delirium protocol (if indicated): Fentanyl/propofol VAP protocol (if indicated): In place DVT prophylaxis: Hold anticoagulation GI prophylaxis: On PPI Glucose control: SSI Mobility: Bedrest Code Status: Full code Family Communication: Pending Disposition: ICU  Labs   CBC: Recent Labs  Lab 12/03/18 1222 12/04/18 0643  WBC 9.5 9.8  NEUTROABS 7.6  --   HGB 5.0* 6.7*  6.7*  HCT 17.9* 23.1*  22.8*  MCV 92.7 90.9  PLT 215 [id number]    Basic Metabolic Panel: Recent Labs  Lab 12/03/18 1222 12/04/18 0643  NA 142 143  K 3.2* 3.5  CL 109 110  CO2 23 25  GLUCOSE 140* 122*  BUN 12 11  CREATININE 0.84 0.74  CALCIUM 8.1* 8.4*  MG  --  2.1   GFR: Estimated Creatinine Clearance: 102.5 mL/min (by C-G formula based on SCr of 0.74 mg/dL). Recent Labs  Lab 12/03/18 1222 12/04/18 0643  WBC 9.5 9.8    Liver Function Tests: Recent Labs  Lab 12/03/18 1222  AST 11*  ALT 7  ALKPHOS 57  BILITOT 0.2*  PROT 6.1*  ALBUMIN 3.0*   No results for input(s): LIPASE, AMYLASE in the last 168 hours. No results for input(s):  AMMONIA in the last 168 hours.  ABG    Component Value Date/Time   PHART 7.320 (L) 09/22/2017 0431   PCO2ART 46.8 09/22/2017 0431   PO2ART 99.0 09/22/2017 0431   HCO3 24.1 09/22/2017 0431   TCO2 20 (L) 11/07/2017 2119   ACIDBASEDEF 2.0 09/22/2017 0431   O2SAT 97.0 09/22/2017 0431     Coagulation Profile: Recent Labs  Lab 12/04/18 0643  INR 1.0    Cardiac Enzymes: No results for input(s): CKTOTAL, CKMB, CKMBINDEX, TROPONINI in the last 168 hours.  HbA1C: No results found for: HGBA1C  CBG: No results for input(s): GLUCAP in the last 168 hours.  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   Surgical History    Past Surgical History:  Procedure Laterality Date  . ABDOMINAL HYSTERECTOMY    . CARPAL TUNNEL RELEASE  05/13/2011   Procedure: CARPAL TUNNEL RELEASE;  Surgeon: Nita Sells, MD;  Location: [place];  Service: Orthopedics;  Laterality: Left;  . COLONOSCOPY WITH PROPOFOL N/A 04/28/2014   Procedure: COLONOSCOPY WITH PROPOFOL;  Surgeon: Cleotis Nipper, MD;  Location: [facility] ENDOSCOPY;  Service: Endoscopy;  Laterality: N/A;  . DG TOES*L*  2/10   rt  . DILATION AND CURETTAGE OF UTERUS    . ENTEROSCOPY N/A 10/17/2017   Procedure: ENTEROSCOPY;  Surgeon: Otis Brace, MD;  Location: [place] ENDOSCOPY;  Service: Gastroenterology;  Laterality: N/A;  . ESOPHAGOGASTRODUODENOSCOPY N/A 04/10/2014   Procedure: ESOPHAGOGASTRODUODENOSCOPY (EGD);  Surgeon: [name], MD;  Location: [facility] ENDOSCOPY;  Service: Endoscopy;  Laterality: N/A;  . ESOPHAGOGASTRODUODENOSCOPY N/A 05/10/2017   Procedure: ESOPHAGOGASTRODUODENOSCOPY  (EGD);  Surgeon: Ronald Lobo, MD;  Location: [facility] ENDOSCOPY;  Service: Endoscopy;  Laterality: N/A;  . ESOPHAGOGASTRODUODENOSCOPY N/A 09/22/2017   Procedure: ESOPHAGOGASTRODUODENOSCOPY (EGD);  Surgeon: Clarene Essex, MD;  Location: [place];  Service: Endoscopy;  Laterality: N/A;  bedside  . ESOPHAGOGASTRODUODENOSCOPY (EGD) WITH PROPOFOL N/A 04/27/2014   Procedure: ESOPHAGOGASTRODUODENOSCOPY (EGD) WITH PROPOFOL;  Surgeon: Cleotis Nipper, MD;  Location: [place];  Service: Endoscopy;  Laterality: N/A;  possible apc  . ESOPHAGOGASTRODUODENOSCOPY (EGD) WITH PROPOFOL N/A 09/30/2017   Procedure: ESOPHAGOGASTRODUODENOSCOPY (EGD) WITH PROPOFOL;  Surgeon: [name], MD;  Location: [place];  Service: Gastroenterology;  Laterality: N/A;  . ESOPHAGOGASTRODUODENOSCOPY (EGD) WITH PROPOFOL N/A 10/01/2017   Procedure: ESOPHAGOGASTRODUODENOSCOPY (EGD) WITH PROPOFOL;  Surgeon: [name], MD;  Location: [place];  Service: Gastroenterology;  Laterality: N/A;  . ESOPHAGOGASTRODUODENOSCOPY (EGD) WITH PROPOFOL N/A 10/08/2017   Procedure: ESOPHAGOGASTRODUODENOSCOPY (EGD) WITH PROPOFOL;  Surgeon: Otis Brace, MD;  Location: [facility] ENDOSCOPY;  Service: Gastroenterology;  Laterality: N/A;  . ESOPHAGOGASTRODUODENOSCOPY (EGD) WITH PROPOFOL N/A 10/17/2017   Procedure: ESOPHAGOGASTRODUODENOSCOPY (EGD) WITH PROPOFOL;  Surgeon: Otis Brace, MD;  Location: [facility] ENDOSCOPY;  Service: Gastroenterology;  Laterality: N/A;  . ESOPHAGOGASTRODUODENOSCOPY (EGD) WITH  PROPOFOL N/A 10/19/2017   Procedure: ESOPHAGOGASTRODUODENOSCOPY (EGD) WITH PROPOFOL;  Surgeon: Otis Brace, MD;  Location: [place];  Service: Gastroenterology;  Laterality: N/A;  . GIVENS CAPSULE STUDY N/A 10/02/2017   Procedure: GIVENS CAPSULE STUDY;  Surgeon: [name], MD;  Location: [place];  Service: Gastroenterology;  Laterality: N/A;  . GIVENS CAPSULE STUDY N/A 10/08/2017   Procedure: GIVENS CAPSULE STUDY;  Surgeon: Otis Brace,  MD;  Location: [place];  Service: Gastroenterology;  Laterality: N/A;  endoscopic placement of capsule  . HOT HEMOSTASIS N/A 04/27/2014   Procedure: HOT HEMOSTASIS (ARGON PLASMA COAGULATION/BICAP);  Surgeon: Cleotis Nipper, MD;  Location[facility] ENDOSCOPY;  Service: Endoscopy;  Laterality: N/A;  . HOT HEMOSTASIS N/A 09/30/2017   Procedure: HOT HEMOSTASIS (ARGON PLASMA COAGULATION/BICAP);  Surgeon: [name], MD;  Location: [place];  Service: Gastroenterology;  Laterality: N/A;  . HOT HEMOSTASIS N/A 10/01/2017   Procedure: HOT HEMOSTASIS (ARGON PLASMA COAGULATION/BICAP);  Surgeon: [name], MD;  Location: [place];  Service: Gastroenterology;  Laterality: N/A;  . HOT HEMOSTASIS N/A 10/17/2017   Procedure: HOT HEMOSTASIS (ARGON PLASMA COAGULATION/BICAP);  Surgeon: Otis Brace, MD;  Location: [facility] ENDOSCOPY;  Service: Gastroenterology;  Laterality: N/A;  . HOT HEMOSTASIS N/A 10/19/2017   Procedure: HOT HEMOSTASIS (ARGON PLASMA COAGULATION/BICAP);  Surgeon: Otis Brace, MD;  Location: [facility] ENDOSCOPY;  Service: Gastroenterology;  Laterality: N/A;  . IR IMAGING GUIDED PORT INSERTION  07/08/2018  . L shoulder Surgery  2011  . SUBMUCOSAL INJECTION  09/22/2017   Procedure: SUBMUCOSAL INJECTION;  Surgeon: Clarene Essex, MD;  Location: [facility] ENDOSCOPY;  Service: Endoscopy;;     Social History   reports that she has been smoking cigarettes. She has a 15.00 pack-year smoking history. She quit smokeless tobacco use about 39 years ago.  Her smokeless tobacco use included snuff. She reports that she does not drink alcohol or use drugs.   Family History   Her family history includes Bleeding Disorder in her mother, sister, and son; Cancer in her mother; Colon cancer in her maternal grandfather; Crohn's disease in her maternal grandfather; Diabetes in her mother and sister; Diabetes type II in her sister; Kidney disease in her mother and son; Ovarian cancer in her mother; Stomach cancer in her maternal  grandmother. There is no history of Dysmenorrhea.   Allergies Allergies  Allergen Reactions  . Feraheme [Ferumoxytol] Other (See Comments)    Muscle tetany, encephalopathy, fatigue, nausea (reaction to injection)  . Nsaids Other (See Comments)    Stomach bleeding episodes; Tylenol is OK  . Tomato Hives  . Wasp Venom Swelling     Home Medications  Prior to Admission medications   Medication Sig Start Date End Date Taking? Authorizing Provider  albuterol (PROVENTIL HFA;VENTOLIN HFA) 108 (90 Base) MCG/ACT inhaler Inhale 1-2 puffs into the lungs every 6 (six) hours as needed for wheezing or shortness of breath. 04/03/17  Yes Molt, Bethany, DO  ALPRAZolam (XANAX) 0.5 MG tablet TAKE 1 TABLET BY MOUTH 2 TIMES A DAY AS NEEDED FOR ANXIETY Patient taking differently: Take 0.5 mg by mouth 2 (two) times daily as needed for anxiety.  12/03/18  Yes Truitt Merle, MD  Aminocaproic Acid (AMICAR) 1000 MG TABS Take 1 tablet (1,000 mg total) by mouth 2 (two) times daily. 09/11/18  Yes Truitt Merle, MD  amLODipine (NORVASC) 10 MG tablet Take 1 tablet (10 mg total) by mouth daily. 08/24/18  Yes Alla Feeling, NP  atorvastatin (LIPITOR) 80 MG tablet TAKE 1 TABLET(80 MG) BY MOUTH DAILY Patient  taking differently: Take 80 mg by mouth daily.  08/28/18  Yes Truitt Merle, MD  diphenhydramine-acetaminophen (TYLENOL PM) 25-500 MG TABS tablet Take 1 tablet by mouth at bedtime as needed (sleep).   Yes [provider]  guaifenesin (ROBITUSSIN) 100 MG/5ML syrup Take 200 mg by mouth 3 (three) times daily as needed for cough or congestion.   Yes [provider]  hydrochlorothiazide (HYDRODIURIL) 12.5 MG tablet TAKE 1 TABLET(12.5 MG) BY MOUTH DAILY Patient taking differently: Take 12.5 mg by mouth daily.  05/01/18  Yes Truitt Merle, MD  pantoprazole (PROTONIX) 40 MG tablet Take 1 tablet (40 mg total) by mouth daily. 10/14/18  Yes Alla Feeling, NP  prochlorperazine (COMPAZINE) 10 MG tablet Take 1 tablet (10 mg total) by  mouth every 6 (six) hours as needed for nausea or vomiting. 10/14/18  Yes Alla Feeling, NP  sertraline (ZOLOFT) 50 MG tablet TAKE 1 TABLET(50 MG) BY MOUTH DAILY Patient taking differently: Take 50 mg by mouth daily.  12/03/18  Yes Truitt Merle, MD  tamoxifen (NOLVADEX) 20 MG tablet Take 1 tablet (20 mg total) by mouth daily. 10/14/18  Yes Alla Feeling, NP  ALPRAZolam Duanne Moron) 0.5 MG tablet TAKE 1 TABLET(0.5 MG) BY MOUTH TWICE DAILY AS NEEDED FOR ANXIETY Patient not taking: Reported on 12/03/2018 12/02/18   Alla Feeling, NP  ferrous gluconate (FERGON) 324 MG tablet Take 1 tablet (324 mg total) by mouth 3 (three) times daily with meals. Patient not taking: Reported on 12/03/2018 01/20/18 02/19/18  Kathi Ludwig, MD  lidocaine-prilocaine (EMLA) cream Apply 1 application topically as needed. 07/17/18   Truitt Merle, MD  sertraline (ZOLOFT) 50 MG tablet TAKE 1 TABLET(50 MG) BY MOUTH DAILY Patient not taking: Reported on 12/03/2018 12/02/18   Alla Feeling, NP    The patient is critically ill with multiple organ system failure and requires high complexity decision making for assessment and support, frequent evaluation and titration of therapies, advanced monitoring, review of radiographic studies and interpretation of complex data.    Critical Care Time devoted to patient care services, exclusive of separately billable procedures, described in this note is 35 minutes.  Sherrilyn Rist, MD [place], PCCM Cell: [phone]

## 2018-12-04 NOTE — Op Note (Signed)
[facility] Patient Name: Peggy House Procedure Date: 12/04/2018 MRN: [id number] Attending MD: [name] , MD Date of Birth: May 11, 1960 CSN: [id number] Age: 58 Admit Type: Inpatient Procedure:                Upper GI endoscopy Indications:              Active gastrointestinal bleeding, Acute post                            hemorrhagic anemia, Melena Providers:                [name], MD, Cleda Daub, RN, Marguerita Merles, Technician Referring MD:             hospital team Medicines:                Propofol per Anesthesia, Monitored Anesthesia Care                            (due to regurgitation of blood patient was                            intubated for airway protection); Per anesthesia                            note Complications:            No immediate complications. Estimated Blood Loss:     Estimated blood loss was minimal. Procedure:                Pre-Anesthesia Assessment:                           - Prior to the procedure, a History and Physical                            was performed, and patient medications and                            allergies were reviewed. The patient's tolerance of                            previous anesthesia was also reviewed. The risks                            and benefits of the procedure and the sedation                            options and risks were discussed with the patient.                            All questions were answered, and informed consent                            was obtained. Prior  Anticoagulants: The patient has                            taken no previous anticoagulant or antiplatelet                            agents. ASA Grade Assessment: III - A patient with                            severe systemic disease. After reviewing the risks                            and benefits, the patient was deemed in                            satisfactory  condition to undergo the procedure.                           After obtaining informed consent, the endoscope was                            passed under direct vision. Throughout the                            procedure, the patient's blood pressure, pulse, and                            oxygen saturations were monitored continuously. The                            [id number] Olympus gastroscope was                            introduced through the mouth, and advanced to the                            second part of duodenum. The [id number]                            Olympus therapeutic endoscope was introduced                            through the mouth, and advanced to the second part                            of duodenum. The upper GI endoscopy was extremely                            difficult due to excessive bleeding and the                            patient's oxygen desaturation. Successful  completion of the procedure was aided by performing                            the maneuvers documented (below) in this report.                            The patient tolerated the procedure. Scope In: Scope Out: Findings:      The examined esophagus was normal.      The Z-line was regular and was found 40 cm from the incisors.      Red blood was found in the cardia, in the gastric fundus and in the       gastric body.      A Dieulafoy lesion with oozing bleeding and stigmata of recent bleeding       was found in the gastric body. Area was successfully injected with 9 mL       of a 1:10,000 solution of epinephrine for hemostasis. Estimated blood       loss was minimal. To stop active bleeding, two hemostatic clips were       successfully placed. There was no bleeding at the end of the procedure.      Segmental mild inflammation characterized by congestion (edema) and       erythema was found in the gastric antrum.      Large amount of bright red  and maroon-colored blood clots that were       removed with multiple passes of a Roth net and aspirated. Small amount       of clots remained preventing complete visualization of the fundus. A       raised area concerning for a Dieulafoy lesion vs visible vessel in a       small ulcer was noted in the body of the stomach.      The examined duodenum was normal. Impression:               - Normal esophagus.                           - Z-line regular, 40 cm from the incisors.                           - Red blood in the cardia, in the gastric fundus                            and in the gastric body.                           - Dieulafoy lesion of stomach.                           - Acute gastritis.                           - Normal examined duodenum.                           - No specimens collected. Moderate Sedation:      Not Applicable - Patient had care per Anesthesia. Recommendation:           -  Observe patient's clinical course.                           - Give Protonix (pantoprazole): initiate therapy                            with 80 mg IV bolus, then 8 mg/hr IV by continuous                            infusion. Procedure Code(s):        --- Professional ---                           [phone], Esophagogastroduodenoscopy, flexible,                            transoral; with control of bleeding, any method Diagnosis Code(s):        --- Professional ---                           K31.82, Dieulafoy lesion (hemorrhagic) of stomach                            and duodenum                           K92.1, Melena (includes Hematochezia)                           D62, Acute posthemorrhagic anemia                           K29.00, Acute gastritis without bleeding                           K92.2, Gastrointestinal hemorrhage, unspecified CPT copyright 2019 [employer]. All rights reserved. The codes documented in this report are preliminary and upon coder review may  be  revised to meet current compliance requirements. [name], MD 12/04/2018 3:20:32 PM This report has been signed electronically. Number of Addenda: 0

## 2018-12-04 NOTE — H&P (View-Only) (Signed)
Referring Provider: Dr. Erlinda Hong Primary Care Physician:  [name], MD Primary Gastroenterologist:  Dr. Cristina Gong  Reason for Consultation:  GI bleed  HPI: Peggy House is a 58 y.o. 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 Multiple endoscopic procedures in 2019 for management of her GI bleeding Has been having black stools for the last few days and felt weak but denies dizziness, hematochezia, hematemesis, or abdominal pain. Hgb 5 at Dr. Ernestina Penna office and sent over to the ER. Hgb 6.7 following 3 U PRBCs.   Past Medical History:  Diagnosis Date  . Anxiety   . Arthritis    knnes,back  . GERD (gastroesophageal reflux disease)   . Hereditary hemorrhagic telangiectasia ([place])   . History of swelling of feet   . Hyperlipidemia   . Hypertension   . Major depressive disorder, recurrent episode ([place]) 06/05/2015  . Obesity   . Snores     Past Surgical History:  Procedure Laterality Date  . ABDOMINAL HYSTERECTOMY    . CARPAL TUNNEL RELEASE  05/13/2011   Procedure: CARPAL TUNNEL RELEASE;  Surgeon: Nita Sells, MD;  Location: [place];  Service: Orthopedics;  Laterality: Left;  . COLONOSCOPY WITH PROPOFOL N/A 04/28/2014   Procedure: COLONOSCOPY WITH PROPOFOL;  Surgeon: Cleotis Nipper, MD;  Location: [facility] ENDOSCOPY;  Service: Endoscopy;  Laterality: N/A;  . DG TOES*L*  2/10   rt  . DILATION AND CURETTAGE OF UTERUS    . ENTEROSCOPY N/A 10/17/2017   Procedure: ENTEROSCOPY;  Surgeon: Otis Brace, MD;  Location: [place] ENDOSCOPY;  Service: Gastroenterology;  Laterality: N/A;  . ESOPHAGOGASTRODUODENOSCOPY N/A 04/10/2014   Procedure: ESOPHAGOGASTRODUODENOSCOPY  (EGD);  Surgeon: [name], MD;  Location: [facility] ENDOSCOPY;  Service: Endoscopy;  Laterality: N/A;  . ESOPHAGOGASTRODUODENOSCOPY N/A 05/10/2017   Procedure: ESOPHAGOGASTRODUODENOSCOPY (EGD);  Surgeon: Ronald Lobo, MD;  Location: [facility] ENDOSCOPY;  Service: Endoscopy;  Laterality: N/A;  . ESOPHAGOGASTRODUODENOSCOPY N/A 09/22/2017   Procedure: ESOPHAGOGASTRODUODENOSCOPY (EGD);  Surgeon: Clarene Essex, MD;  Location: [place];  Service: Endoscopy;  Laterality: N/A;  bedside  . ESOPHAGOGASTRODUODENOSCOPY (EGD) WITH PROPOFOL N/A 04/27/2014   Procedure: ESOPHAGOGASTRODUODENOSCOPY (EGD) WITH PROPOFOL;  Surgeon: Cleotis Nipper, MD;  Location: [place];  Service: Endoscopy;  Laterality: N/A;  possible apc  . ESOPHAGOGASTRODUODENOSCOPY (EGD) WITH PROPOFOL N/A 09/30/2017   Procedure: ESOPHAGOGASTRODUODENOSCOPY (EGD) WITH PROPOFOL;  Surgeon: [name], MD;  Location: [place];  Service: Gastroenterology;  Laterality: N/A;  . ESOPHAGOGASTRODUODENOSCOPY (EGD) WITH PROPOFOL N/A 10/01/2017   Procedure: ESOPHAGOGASTRODUODENOSCOPY (EGD) WITH PROPOFOL;  Surgeon: [name], MD;  Location: [place];  Service: Gastroenterology;  Laterality: N/A;  . ESOPHAGOGASTRODUODENOSCOPY (EGD) WITH PROPOFOL N/A 10/08/2017   Procedure: ESOPHAGOGASTRODUODENOSCOPY (EGD) WITH PROPOFOL;  Surgeon: Otis Brace, MD;  Location: [facility] ENDOSCOPY;  Service: Gastroenterology;  Laterality: N/A;  . ESOPHAGOGASTRODUODENOSCOPY (EGD) WITH PROPOFOL N/A 10/17/2017   Procedure: ESOPHAGOGASTRODUODENOSCOPY (EGD) WITH PROPOFOL;  Surgeon: Otis Brace, MD;  Location: [facility] ENDOSCOPY;  Service: Gastroenterology;  Laterality: N/A;  . ESOPHAGOGASTRODUODENOSCOPY (EGD) WITH PROPOFOL N/A 10/19/2017   Procedure: ESOPHAGOGASTRODUODENOSCOPY (EGD) WITH PROPOFOL;  Surgeon: Otis Brace, MD;  Location: [facility] ENDOSCOPY;  Service: Gastroenterology;  Laterality: N/A;  . GIVENS CAPSULE STUDY N/A 10/02/2017   Procedure: GIVENS  CAPSULE STUDY;  Surgeon:  [name], MD;  Location: [place];  Service: Gastroenterology;  Laterality: N/A;  . GIVENS CAPSULE STUDY N/A 10/08/2017   Procedure: GIVENS CAPSULE STUDY;  Surgeon: Otis Brace, MD;  Location: [place];  Service: Gastroenterology;  Laterality: N/A;  endoscopic placement of capsule  . HOT HEMOSTASIS N/A 04/27/2014   Procedure: HOT HEMOSTASIS (ARGON PLASMA COAGULATION/BICAP);  Surgeon: Cleotis Nipper, MD;  Location: [facility] ENDOSCOPY;  Service: Endoscopy;  Laterality: N/A;  . HOT HEMOSTASIS N/A 09/30/2017   Procedure: HOT HEMOSTASIS (ARGON PLASMA COAGULATION/BICAP);  Surgeon: [name], MD;  Location: [place];  Service: Gastroenterology;  Laterality: N/A;  . HOT HEMOSTASIS N/A 10/01/2017   Procedure: HOT HEMOSTASIS (ARGON PLASMA COAGULATION/BICAP);  Surgeon: [name], MD;  Location: [place];  Service: Gastroenterology;  Laterality: N/A;  . HOT HEMOSTASIS N/A 10/17/2017   Procedure: HOT HEMOSTASIS (ARGON PLASMA COAGULATION/BICAP);  Surgeon: Otis Brace, MD;  Location: [facility] ENDOSCOPY;  Service: Gastroenterology;  Laterality: N/A;  . HOT HEMOSTASIS N/A 10/19/2017   Procedure: HOT HEMOSTASIS (ARGON PLASMA COAGULATION/BICAP);  Surgeon: Otis Brace, MD;  Location: [facility] ENDOSCOPY;  Service: Gastroenterology;  Laterality: N/A;  . IR IMAGING GUIDED PORT INSERTION  07/08/2018  . L shoulder Surgery  2011  . SUBMUCOSAL INJECTION  09/22/2017   Procedure: SUBMUCOSAL INJECTION;  Surgeon: Clarene Essex, MD;  Location: [place];  Service: Endoscopy;;    Prior to Admission medications   Medication Sig Start Date End Date Taking? Authorizing Provider  albuterol (PROVENTIL HFA;VENTOLIN HFA) 108 (90 Base) MCG/ACT inhaler Inhale 1-2 puffs into the lungs every 6 (six) hours as needed for wheezing or shortness of breath. 04/03/17  Yes Molt, Bethany, DO  ALPRAZolam (XANAX) 0.5 MG tablet TAKE 1 TABLET BY MOUTH 2 TIMES A DAY AS NEEDED FOR ANXIETY Patient taking differently: Take 0.5 mg by  mouth 2 (two) times daily as needed for anxiety.  12/03/18  Yes Truitt Merle, MD  Aminocaproic Acid (AMICAR) 1000 MG TABS Take 1 tablet (1,000 mg total) by mouth 2 (two) times daily. 09/11/18  Yes Truitt Merle, MD  amLODipine (NORVASC) 10 MG tablet Take 1 tablet (10 mg total) by mouth daily. 08/24/18  Yes Alla Feeling, NP  atorvastatin (LIPITOR) 80 MG tablet TAKE 1 TABLET(80 MG) BY MOUTH DAILY Patient taking differently: Take 80 mg by mouth daily.  08/28/18  Yes Truitt Merle, MD  diphenhydramine-acetaminophen (TYLENOL PM) 25-500 MG TABS tablet Take 1 tablet by mouth at bedtime as needed (sleep).   Yes [provider]  guaifenesin (ROBITUSSIN) 100 MG/5ML syrup Take 200 mg by mouth 3 (three) times daily as needed for cough or congestion.   Yes [provider]  hydrochlorothiazide (HYDRODIURIL) 12.5 MG tablet TAKE 1 TABLET(12.5 MG) BY MOUTH DAILY Patient taking differently: Take 12.5 mg by mouth daily.  05/01/18  Yes Truitt Merle, MD  pantoprazole (PROTONIX) 40 MG tablet Take 1 tablet (40 mg total) by mouth daily. 10/14/18  Yes Alla Feeling, NP  prochlorperazine (COMPAZINE) 10 MG tablet Take 1 tablet (10 mg total) by mouth every 6 (six) hours as needed for nausea or vomiting. 10/14/18  Yes Alla Feeling, NP  sertraline (ZOLOFT) 50 MG tablet TAKE 1 TABLET(50 MG) BY MOUTH DAILY Patient taking differently: Take 50 mg by mouth daily.  12/03/18  Yes Truitt Merle, MD  tamoxifen (NOLVADEX) 20 MG tablet Take 1 tablet (20 mg total) by mouth daily. 10/14/18  Yes Alla Feeling, NP  ALPRAZolam Duanne Moron) 0.5 MG tablet TAKE 1 TABLET(0.5  MG) BY MOUTH TWICE DAILY AS NEEDED FOR ANXIETY Patient not taking: Reported on 12/03/2018 12/02/18   Alla Feeling, NP  ferrous gluconate (FERGON) 324 MG tablet Take 1 tablet (324 mg total) by mouth 3 (three) times daily with meals. Patient not taking: Reported on 12/03/2018 01/20/18 02/19/18  Kathi Ludwig, MD  lidocaine-prilocaine (EMLA) cream Apply 1 application topically as  needed. 07/17/18   Truitt Merle, MD  sertraline (ZOLOFT) 50 MG tablet TAKE 1 TABLET(50 MG) BY MOUTH DAILY Patient not taking: Reported on 12/03/2018 12/02/18   Alla Feeling, NP    Scheduled Meds: . [MAR Hold] sodium chloride   Intravenous Once  . [MAR Hold] aminocaproic acid  1 g Oral Q8H  . [MAR Hold] atorvastatin  80 mg Oral Daily  . [MAR Hold] guaiFENesin  600 mg Oral BID  . [MAR Hold] pantoprazole  40 mg Oral Daily  . [MAR Hold] sertraline  50 mg Oral Daily  . [MAR Hold] tamoxifen  20 mg Oral Daily   Continuous Infusions: . sodium chloride    . lactated ringers     PRN Meds:.[MAR Hold] diphenhydrAMINE **AND** [MAR Hold] acetaminophen, [MAR Hold] albuterol, [MAR Hold] ALPRAZolam, [MAR Hold] prochlorperazine, [MAR Hold] sodium chloride flush  Allergies as of 12/03/2018 - Review Complete 12/03/2018  Allergen Reaction Noted  . Feraheme [ferumoxytol] Other (See Comments) 10/09/2017  . Nsaids Other (See Comments) 04/25/2014  . Tomato Hives 01/01/2012  . Wasp venom Swelling 10/06/2017    Family History  Problem Relation Age of Onset  . Cancer Mother        Ovarian  . Ovarian cancer Mother   . Diabetes Mother   . Kidney disease Mother   . Bleeding Disorder Mother   . Diabetes type II Sister   . Bleeding Disorder Sister   . Diabetes Sister   . Colon cancer Maternal Grandfather   . Crohn's disease Maternal Grandfather   . Stomach cancer Maternal Grandmother   . Kidney disease Son   . Bleeding Disorder Son   . Dysmenorrhea Neg Hx     Social History   Socioeconomic History  . Marital status: Single    Spouse name: Not on file  . Number of children: Not on file  . Years of education: Not on file  . Highest education level: Not on file  Occupational History  . Not on file  Social Needs  . Financial resource strain: Not on file  . Food insecurity    Worry: Not on file    Inability: Not on file  . Transportation needs    Medical: Not on file    Non-medical: Not on file   Tobacco Use  . Smoking status: Current Every Day Smoker    Packs/day: 0.50    Years: 30.00    Pack years: 15.00    Types: Cigarettes  . Smokeless tobacco: Former Systems developer    Types: Snuff    Quit date: 50  . Tobacco comment: 1/2 PPD  Substance and Sexual Activity  . Alcohol use: No    Alcohol/week: 0.0 standard drinks  . Drug use: No    Comment: last cocaine-2010  . Sexual activity: Yes    Birth control/protection: Surgical    Comment: Hysterectomy  Lifestyle  . Physical activity    Days per week: Not on file    Minutes per session: Not on file  . Stress: Not on file  Relationships  . Social connections    Talks on phone: Not on file  Gets together: Not on file    Attends religious service: Not on file    Active member of club or organization: Not on file    Attends meetings of clubs or organizations: Not on file    Relationship status: Not on file  . Intimate partner violence    Fear of current or ex partner: Not on file    Emotionally abused: Not on file    Physically abused: Not on file    Forced sexual activity: Not on file  Other Topics Concern  . Not on file  Social History Narrative  . Not on file    Review of Systems: All negative except as stated above in HPI.  Physical Exam: Vital signs: Vitals:   12/04/18 0115 12/04/18 0437  BP: 140/75 125/66  Pulse: 74 70  Resp: 20 18  Temp: 99.4 F (37.4 C) 98.4 F (36.9 C)  SpO2: 98% 94%   Last BM Date: 12/03/18 General:   Lethargic, obese, no acute distress Head: normocephalic, atraumatic Eyes: anicteric sclera ENT: oropharynx clear Neck: supple, nontender Lungs:  Clear throughout to auscultation.   No wheezes, crackles, or rhonchi. No acute distress. Heart:  Regular rate and rhythm; no murmurs, clicks, rubs,  or gallops. Abdomen: soft, nontender, nondistended, +BS  Rectal:  Deferred Ext: no edema  GI:  Lab Results: Recent Labs    12/03/18 1222 12/04/18 0643  WBC 9.5 9.8  HGB 5.0* 6.7*  6.7*   HCT 17.9* 23.1*  22.8*  PLT 215 207   BMET Recent Labs    12/03/18 1222 12/04/18 0643  NA 142 143  K 3.2* 3.5  CL 109 110  CO2 23 25  GLUCOSE 140* 122*  BUN 12 11  CREATININE 0.84 0.74  CALCIUM 8.1* 8.4*   LFT Recent Labs    12/03/18 1222  PROT 6.1*  ALBUMIN 3.0*  AST 11*  ALT 7  ALKPHOS 57  BILITOT 0.2*   PT/INR Recent Labs    12/04/18 0643  LABPROT 13.4  INR 1.0     Studies/Results: No results found.  Impression/Plan: Obscure GI bleeding likely from small bowel AVMs but needs a repeat EGD to look for gastric AVMs and/or recurrence of the Dieulafoy lesion. EGD today to further evaluate. Supportive care. Agree with additional blood transfusions. Continue PPI but may need to change to Protonix drip pending EGD findings.    LOS: 1 day   [name]  12/04/2018, 1:01 PM  Questions please call [phone]

## 2018-12-04 NOTE — Anesthesia Procedure Notes (Signed)
Procedure Name: MAC Date/Time: 12/04/2018 1:26 PM Performed by: Niel Hummer, CRNA Pre-anesthesia Checklist: Patient identified, Emergency Drugs available, Suction available and Patient being monitored Patient Re-evaluated:Patient Re-evaluated prior to induction Oxygen Delivery Method: Simple face mask

## 2018-12-04 NOTE — Anesthesia Preprocedure Evaluation (Signed)
Anesthesia Evaluation  Patient identified by MRN, date of birth, ID band Patient awake    Reviewed: Allergy & Precautions, NPO status , Patient's Chart, lab work & pertinent test results  Airway Mallampati: III  TM Distance: >3 FB Neck ROM: Full    Dental no notable dental hx. (+) Poor Dentition, Loose,    Pulmonary Current Smoker and Patient abstained from smoking.,    Pulmonary exam normal breath sounds clear to auscultation       Cardiovascular hypertension, Pt. on medications Normal cardiovascular exam Rhythm:Regular Rate:Normal     Neuro/Psych    GI/Hepatic   Endo/Other    Renal/GU      Musculoskeletal   Abdominal (+) + obese,   Peds  Hematology   Anesthesia Other Findings   Reproductive/Obstetrics                             Anesthesia Physical  Anesthesia Plan  ASA: III  Anesthesia Plan: MAC   Post-op Pain Management:    Induction: Intravenous  PONV Risk Score and Plan: Ondansetron, Propofol infusion and Midazolam  Airway Management Planned: Natural Airway and Nasal Cannula  Additional Equipment:   Intra-op Plan:   Post-operative Plan:   Informed Consent: I have reviewed the patients History and Physical, chart, labs and discussed the procedure including the risks, benefits and alternatives for the proposed anesthesia with the patient or authorized representative who has indicated his/her understanding and acceptance.     Dental advisory given  Plan Discussed with: CRNA and Anesthesiologist  Anesthesia Plan Comments:         Anesthesia Quick Evaluation

## 2018-12-04 NOTE — Brief Op Note (Signed)
Active bleed in stomach and after clearing a large portion of the fresh and old blood clots a Dieulafoy lesion was noted that was injected and clipped. See endopro note for details. Patient intubated during the procedure for airway protection due to regurgitation of blood and subsequent hypoxia. Patient will be transferred to the ICU. Protonix drip ordered. If rebleeding occurs, then will need embolization.

## 2018-12-04 NOTE — Transfer of Care (Signed)
Immediate Anesthesia Transfer of Care Note  Patient: Peggy House  Procedure(s) Performed: ESOPHAGOGASTRODUODENOSCOPY (EGD) WITH PROPOFOL (N/A ) HOT HEMOSTASIS (ARGON PLASMA COAGULATION/BICAP) (N/A ) HEMOSTASIS CLIP PLACEMENT SUBMUCOSAL INJECTION  Patient Location: ICU  Anesthesia Type:General  Level of Consciousness: sedated  Airway & Oxygen Therapy: Patient remains intubated per anesthesia plan  Post-op Assessment: Report given to RN and Post -op Vital signs reviewed and stable  Post vital signs: Reviewed and stable  Last Vitals:  Vitals Value Taken Time  BP 169/74 12/04/18 1516  Temp    Pulse 97 12/04/18 1519  Resp 19 12/04/18 1517  SpO2 98 % 12/04/18 1519  Vitals shown include unvalidated device data.  Last Pain:  Vitals:   12/04/18 1301  TempSrc: Oral  PainSc: 0-No pain         Complications: No apparent anesthesia complications

## 2018-12-04 NOTE — Anesthesia Postprocedure Evaluation (Signed)
Anesthesia Post Note  Patient: Peggy House  Procedure(s) Performed: ESOPHAGOGASTRODUODENOSCOPY (EGD) WITH PROPOFOL (N/A ) HEMOSTASIS CLIP PLACEMENT SUBMUCOSAL INJECTION     Patient location during evaluation: ICU Anesthesia Type: General Level of consciousness: sedated Pain management: pain level controlled Vital Signs Assessment: post-procedure vital signs reviewed and stable Respiratory status: patient remains intubated per anesthesia plan Cardiovascular status: stable Postop Assessment: no apparent nausea or vomiting Anesthetic complications: no    Last Vitals:  Vitals:   12/04/18 1301 12/04/18 1512  BP: 140/69   Pulse: 71 (!) 108  Resp: 17 18  Temp: 36.9 C   SpO2: 98% 99%    Last Pain:  Vitals:   12/04/18 1301  TempSrc: Oral  PainSc: 0-No pain                 Montez Hageman

## 2018-12-04 NOTE — Progress Notes (Addendum)
PROGRESS NOTE  Peggy House [id number] DOB: Mar 18, 1961 DOA: 12/03/2018 PCP: [name], MD  Brief history: H/o hereditary hemorrhagic telangiectasia, h/o GI AVMs, sent from hematology clinic to the ED due to hgb 5 and melena. Cbc/bmp was done at the hematology clinic showed hgb 5, k 3.2, cr 0.84, she is sent to the ED for further evaluation. She reports had melena for a few days, she denies ab pain, no chest pain, no dizziness, does reports feeling tired and have sob   ED course: vital signs are stable. EDP ordered two units of blood, [facility] consulted, plan to do EGD in am, hospitalist called to admit the patient.    HPI/Recap of past 24 hours:  She received 2units of prbc  And one dose of iv iron since admission, hgb this am is 6.7, will give another unit prbc She is npo , awaiting for endoscope this am  Assessment/Plan: Active Problems:   Obesity, Class III, BMI 40-49.9 (morbid obesity) ([place])   Acute blood loss anemia  Melena/acute blood loss anemia/iron deficiency anemia -H/o GI AVM , h/o double balloon enteroscopy and gastric arterial embolization  in the past -prbc transfusion x2untis last night, one dose of iv iron, will give another unit today on 8/21 -[facility] input appreciated,   addendum: active bleed found during endoscope, patient intubated in the endosuite due to hypoxia likely from aspirated blood Case transition to critical care, case discussed with critical care Dr Hermina Staggers.  Hereditary hemorrhagic telangiectasia -home regimen: amicar, tamoxifen, avastin ( getting from [facility]) , iron supplement -hematology oncology Dr Burr Medico input appreciated  Hypokalemia: continue hold hctz, replace k,  mag 2.1  HTN:  on norvasc, hctz  at home, will hold for now in the setting of gi bleed/ anemia, hydralazine prn for sbp > 170 sbp 125 this am, continue hold bp meds, monitor   Class III obesity: Body mass index is 44.74 kg/m.  DVT prophylaxis: scd's    Code Status: full  Family Communication: patient   Disposition Plan: not ready to discharge   Consultants:  GI  Hematology   Critical care  Procedures:  egd on 8/21  Antibiotics:  none   Objective: BP 125/66 (BP Location: Right Arm)   Pulse 70   Temp 98.4 F (36.9 C)   Resp 18   Ht 5' 5.5" (1.664 m)   Wt 123.8 kg   SpO2 94%   BMI 44.74 kg/m   Intake/Output Summary (Last 24 hours) at 12/04/2018 0745 Last data filed at 12/04/2018 [id number] Gross per 24 hour  Intake 928 ml  Output -  Net 928 ml   Filed Weights   12/03/18 1508  Weight: 123.8 kg    Exam: Patient is examined daily including today on 12/04/2018, exams remain the same as of yesterday except that has changed    General:  NAD  Cardiovascular: RRR  Respiratory: CTABL  Abdomen: Soft/ND/NT, positive BS  Musculoskeletal: No Edema  Neuro: alert, oriented   Data Reviewed: Basic Metabolic Panel: Recent Labs  Lab 12/03/18 1222 12/04/18 0643  NA 142 143  K 3.2* 3.5  CL 109 110  CO2 23 25  GLUCOSE 140* 122*  BUN 12 11  CREATININE 0.84 0.74  CALCIUM 8.1* 8.4*  MG  --  2.1   Liver Function Tests: Recent Labs  Lab 12/03/18 1222  AST 11*  ALT 7  ALKPHOS 57  BILITOT 0.2*  PROT 6.1*  ALBUMIN 3.0*   No results for input(s): LIPASE,  AMYLASE in the last 168 hours. No results for input(s): AMMONIA in the last 168 hours. CBC: Recent Labs  Lab 12/03/18 1222 12/04/18 0643  WBC 9.5 9.8  NEUTROABS 7.6  --   HGB 5.0* 6.7*  6.7*  HCT 17.9* 23.1*  22.8*  MCV 92.7 90.9  PLT 215 207   Cardiac Enzymes:   No results for input(s): CKTOTAL, CKMB, CKMBINDEX, TROPONINI in the last 168 hours. BNP (last 3 results) No results for input(s): BNP in the last 8760 hours.  ProBNP (last 3 results) No results for input(s): PROBNP in the last 8760 hours.  CBG: No results for input(s): GLUCAP in the last 168 hours.  Recent Results (from the past 240 hour(s))  SARS Coronavirus 2 [facility] order,  Performed in [facility] hospital lab) Nasopharyngeal Nasopharyngeal Swab     Status: None   Collection Time: 12/03/18  3:23 PM   Specimen: Nasopharyngeal Swab  Result Value Ref Range Status   SARS Coronavirus 2 NEGATIVE NEGATIVE Final    Comment: (NOTE) If result is NEGATIVE SARS-CoV-2 target nucleic acids are NOT DETECTED. The SARS-CoV-2 RNA is generally detectable in upper and lower  respiratory specimens during the acute phase of infection. The lowest  concentration of SARS-CoV-2 viral copies this assay can detect is 250  copies / mL. A negative result does not preclude SARS-CoV-2 infection  and should not be used as the sole basis for treatment or other  patient management decisions.  A negative result may occur with  improper specimen collection / handling, submission of specimen other  than nasopharyngeal swab, presence of viral mutation(s) within the  areas targeted by this assay, and inadequate number of viral copies  (<250 copies / mL). A negative result must be combined with clinical  observations, patient history, and epidemiological information. If result is POSITIVE SARS-CoV-2 target nucleic acids are DETECTED. The SARS-CoV-2 RNA is generally detectable in upper and lower  respiratory specimens dur ing the acute phase of infection.  Positive  results are indicative of active infection with SARS-CoV-2.  Clinical  correlation with patient history and other diagnostic information is  necessary to determine patient infection status.  Positive results do  not rule out bacterial infection or co-infection with other viruses. If result is PRESUMPTIVE POSTIVE SARS-CoV-2 nucleic acids MAY BE PRESENT.   A presumptive positive result was obtained on the submitted specimen  and confirmed on repeat testing.  While 2019 novel coronavirus  (SARS-CoV-2) nucleic acids may be present in the submitted sample  additional confirmatory testing may be necessary for epidemiological  and / or  clinical management purposes  to differentiate between  SARS-CoV-2 and other Sarbecovirus currently known to infect humans.  If clinically indicated additional testing with an alternate test  methodology [id number]) is advised. The SARS-CoV-2 RNA is generally  detectable in upper and lower respiratory sp ecimens during the acute  phase of infection. The expected result is Negative. Fact Sheet for Patients:  StrictlyIdeas.no Fact Sheet for Healthcare Providers: BankingDealers.co.za This test is not yet approved or cleared by the [place] FDA and has been authorized for detection and/or diagnosis of SARS-CoV-2 by FDA under an Emergency Use Authorization (EUA).  This EUA will remain in effect (meaning this test can be used) for the duration of the COVID-19 declaration under Section 564(b)(1) of the Act, 21 U.S.C. section 360bbb-3(b)(1), unless the authorization is terminated or revoked sooner. Performed at [facility], [place] [address], [place], [place] [address]  Studies: No results found.  Scheduled Meds: . sodium chloride   Intravenous Once  . aminocaproic acid  1 g Oral Q8H  . atorvastatin  80 mg Oral Daily  . guaiFENesin  600 mg Oral BID  . pantoprazole  40 mg Oral Daily  . sertraline  50 mg Oral Daily  . tamoxifen  20 mg Oral Daily    Continuous Infusions: . ferric gluconate (FERRLECIT/NULECIT) IV       Time spent: 35 mins I have personally reviewed and interpreted on  12/04/2018 daily labs, tele strips, imagings as discussed above under date review session and assessment and plans.  I reviewed all nursing notes, pharmacy notes, consultant notes,  vitals, pertinent old records  I have discussed plan of care as described above with RN , patient on 12/04/2018   Florencia Reasons MD, PhD, FACP  [facility] Pager [phone]. If 7PM-7AM, please contact night-coverage at [email or web address], password [facility]  12/04/2018, 7:45 AM  LOS: 1 day

## 2018-12-04 NOTE — Telephone Encounter (Signed)
Refill request

## 2018-12-04 NOTE — Consult Note (Signed)
Referring Provider: Dr. Erlinda Hong Primary Care Physician:  [name], MD Primary Gastroenterologist:  Dr. Cristina Gong  Reason for Consultation:  GI bleed  HPI: Peggy House is a 58 y.o. 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 Multiple endoscopic procedures in 2019 for management of her GI bleeding Has been having black stools for the last few days and felt weak but denies dizziness, hematochezia, hematemesis, or abdominal pain. Hgb 5 at Dr. Ernestina Penna office and sent over to the ER. Hgb 6.7 following 3 U PRBCs.   Past Medical History:  Diagnosis Date  . Anxiety   . Arthritis    knnes,back  . GERD (gastroesophageal reflux disease)   . Hereditary hemorrhagic telangiectasia ([place])   . History of swelling of feet   . Hyperlipidemia   . Hypertension   . Major depressive disorder, recurrent episode ([place]) 06/05/2015  . Obesity   . Snores     Past Surgical History:  Procedure Laterality Date  . ABDOMINAL HYSTERECTOMY    . CARPAL TUNNEL RELEASE  05/13/2011   Procedure: CARPAL TUNNEL RELEASE;  Surgeon: Nita Sells, MD;  Location: [place];  Service: Orthopedics;  Laterality: Left;  . COLONOSCOPY WITH PROPOFOL N/A 04/28/2014   Procedure: COLONOSCOPY WITH PROPOFOL;  Surgeon: Cleotis Nipper, MD;  Location: [facility] ENDOSCOPY;  Service: Endoscopy;  Laterality: N/A;  . DG TOES*L*  2/10   rt  . DILATION AND CURETTAGE OF UTERUS    . ENTEROSCOPY N/A 10/17/2017   Procedure: ENTEROSCOPY;  Surgeon: Otis Brace, MD;  Location: [place] ENDOSCOPY;  Service: Gastroenterology;  Laterality: N/A;  . ESOPHAGOGASTRODUODENOSCOPY N/A 04/10/2014   Procedure: ESOPHAGOGASTRODUODENOSCOPY  (EGD);  Surgeon: [name], MD;  Location: [facility] ENDOSCOPY;  Service: Endoscopy;  Laterality: N/A;  . ESOPHAGOGASTRODUODENOSCOPY N/A 05/10/2017   Procedure: ESOPHAGOGASTRODUODENOSCOPY (EGD);  Surgeon: Ronald Lobo, MD;  Location: [facility] ENDOSCOPY;  Service: Endoscopy;  Laterality: N/A;  . ESOPHAGOGASTRODUODENOSCOPY N/A 09/22/2017   Procedure: ESOPHAGOGASTRODUODENOSCOPY (EGD);  Surgeon: Clarene Essex, MD;  Location: [place];  Service: Endoscopy;  Laterality: N/A;  bedside  . ESOPHAGOGASTRODUODENOSCOPY (EGD) WITH PROPOFOL N/A 04/27/2014   Procedure: ESOPHAGOGASTRODUODENOSCOPY (EGD) WITH PROPOFOL;  Surgeon: Cleotis Nipper, MD;  Location: [place];  Service: Endoscopy;  Laterality: N/A;  possible apc  . ESOPHAGOGASTRODUODENOSCOPY (EGD) WITH PROPOFOL N/A 09/30/2017   Procedure: ESOPHAGOGASTRODUODENOSCOPY (EGD) WITH PROPOFOL;  Surgeon: [name], MD;  Location: [place];  Service: Gastroenterology;  Laterality: N/A;  . ESOPHAGOGASTRODUODENOSCOPY (EGD) WITH PROPOFOL N/A 10/01/2017   Procedure: ESOPHAGOGASTRODUODENOSCOPY (EGD) WITH PROPOFOL;  Surgeon: [name], MD;  Location: [place];  Service: Gastroenterology;  Laterality: N/A;  . ESOPHAGOGASTRODUODENOSCOPY (EGD) WITH PROPOFOL N/A 10/08/2017   Procedure: ESOPHAGOGASTRODUODENOSCOPY (EGD) WITH PROPOFOL;  Surgeon: Otis Brace, MD;  Location: [facility] ENDOSCOPY;  Service: Gastroenterology;  Laterality: N/A;  . ESOPHAGOGASTRODUODENOSCOPY (EGD) WITH PROPOFOL N/A 10/17/2017   Procedure: ESOPHAGOGASTRODUODENOSCOPY (EGD) WITH PROPOFOL;  Surgeon: Otis Brace, MD;  Location: [facility] ENDOSCOPY;  Service: Gastroenterology;  Laterality: N/A;  . ESOPHAGOGASTRODUODENOSCOPY (EGD) WITH PROPOFOL N/A 10/19/2017   Procedure: ESOPHAGOGASTRODUODENOSCOPY (EGD) WITH PROPOFOL;  Surgeon: Otis Brace, MD;  Location: [facility] ENDOSCOPY;  Service: Gastroenterology;  Laterality: N/A;  . GIVENS CAPSULE STUDY N/A 10/02/2017   Procedure: GIVENS  CAPSULE STUDY;  Surgeon:  [name], MD;  Location: [place];  Service: Gastroenterology;  Laterality: N/A;  . GIVENS CAPSULE STUDY N/A 10/08/2017   Procedure: GIVENS CAPSULE STUDY;  Surgeon: Otis Brace, MD;  Location: [place];  Service: Gastroenterology;  Laterality: N/A;  endoscopic placement of capsule  . HOT HEMOSTASIS N/A 04/27/2014   Procedure: HOT HEMOSTASIS (ARGON PLASMA COAGULATION/BICAP);  Surgeon: Cleotis Nipper, MD;  Location: [facility] ENDOSCOPY;  Service: Endoscopy;  Laterality: N/A;  . HOT HEMOSTASIS N/A 09/30/2017   Procedure: HOT HEMOSTASIS (ARGON PLASMA COAGULATION/BICAP);  Surgeon: [name], MD;  Location: [place];  Service: Gastroenterology;  Laterality: N/A;  . HOT HEMOSTASIS N/A 10/01/2017   Procedure: HOT HEMOSTASIS (ARGON PLASMA COAGULATION/BICAP);  Surgeon: [name], MD;  Location: [place];  Service: Gastroenterology;  Laterality: N/A;  . HOT HEMOSTASIS N/A 10/17/2017   Procedure: HOT HEMOSTASIS (ARGON PLASMA COAGULATION/BICAP);  Surgeon: Otis Brace, MD;  Location: [facility] ENDOSCOPY;  Service: Gastroenterology;  Laterality: N/A;  . HOT HEMOSTASIS N/A 10/19/2017   Procedure: HOT HEMOSTASIS (ARGON PLASMA COAGULATION/BICAP);  Surgeon: Otis Brace, MD;  Location: [facility] ENDOSCOPY;  Service: Gastroenterology;  Laterality: N/A;  . IR IMAGING GUIDED PORT INSERTION  07/08/2018  . L shoulder Surgery  2011  . SUBMUCOSAL INJECTION  09/22/2017   Procedure: SUBMUCOSAL INJECTION;  Surgeon: Clarene Essex, MD;  Location: [place];  Service: Endoscopy;;    Prior to Admission medications   Medication Sig Start Date End Date Taking? Authorizing Provider  albuterol (PROVENTIL HFA;VENTOLIN HFA) 108 (90 Base) MCG/ACT inhaler Inhale 1-2 puffs into the lungs every 6 (six) hours as needed for wheezing or shortness of breath. 04/03/17  Yes Molt, Bethany, DO  ALPRAZolam (XANAX) 0.5 MG tablet TAKE 1 TABLET BY MOUTH 2 TIMES A DAY AS NEEDED FOR ANXIETY Patient taking differently: Take 0.5 mg by  mouth 2 (two) times daily as needed for anxiety.  12/03/18  Yes Truitt Merle, MD  Aminocaproic Acid (AMICAR) 1000 MG TABS Take 1 tablet (1,000 mg total) by mouth 2 (two) times daily. 09/11/18  Yes Truitt Merle, MD  amLODipine (NORVASC) 10 MG tablet Take 1 tablet (10 mg total) by mouth daily. 08/24/18  Yes Alla Feeling, NP  atorvastatin (LIPITOR) 80 MG tablet TAKE 1 TABLET(80 MG) BY MOUTH DAILY Patient taking differently: Take 80 mg by mouth daily.  08/28/18  Yes Truitt Merle, MD  diphenhydramine-acetaminophen (TYLENOL PM) 25-500 MG TABS tablet Take 1 tablet by mouth at bedtime as needed (sleep).   Yes [provider]  guaifenesin (ROBITUSSIN) 100 MG/5ML syrup Take 200 mg by mouth 3 (three) times daily as needed for cough or congestion.   Yes [provider]  hydrochlorothiazide (HYDRODIURIL) 12.5 MG tablet TAKE 1 TABLET(12.5 MG) BY MOUTH DAILY Patient taking differently: Take 12.5 mg by mouth daily.  05/01/18  Yes Truitt Merle, MD  pantoprazole (PROTONIX) 40 MG tablet Take 1 tablet (40 mg total) by mouth daily. 10/14/18  Yes Alla Feeling, NP  prochlorperazine (COMPAZINE) 10 MG tablet Take 1 tablet (10 mg total) by mouth every 6 (six) hours as needed for nausea or vomiting. 10/14/18  Yes Alla Feeling, NP  sertraline (ZOLOFT) 50 MG tablet TAKE 1 TABLET(50 MG) BY MOUTH DAILY Patient taking differently: Take 50 mg by mouth daily.  12/03/18  Yes Truitt Merle, MD  tamoxifen (NOLVADEX) 20 MG tablet Take 1 tablet (20 mg total) by mouth daily. 10/14/18  Yes Alla Feeling, NP  ALPRAZolam Duanne Moron) 0.5 MG tablet TAKE 1 TABLET(0.5  MG) BY MOUTH TWICE DAILY AS NEEDED FOR ANXIETY Patient not taking: Reported on 12/03/2018 12/02/18   Alla Feeling, NP  ferrous gluconate (FERGON) 324 MG tablet Take 1 tablet (324 mg total) by mouth 3 (three) times daily with meals. Patient not taking: Reported on 12/03/2018 01/20/18 02/19/18  Kathi Ludwig, MD  lidocaine-prilocaine (EMLA) cream Apply 1 application topically as  needed. 07/17/18   Truitt Merle, MD  sertraline (ZOLOFT) 50 MG tablet TAKE 1 TABLET(50 MG) BY MOUTH DAILY Patient not taking: Reported on 12/03/2018 12/02/18   Alla Feeling, NP    Scheduled Meds: . [MAR Hold] sodium chloride   Intravenous Once  . [MAR Hold] aminocaproic acid  1 g Oral Q8H  . [MAR Hold] atorvastatin  80 mg Oral Daily  . [MAR Hold] guaiFENesin  600 mg Oral BID  . [MAR Hold] pantoprazole  40 mg Oral Daily  . [MAR Hold] sertraline  50 mg Oral Daily  . [MAR Hold] tamoxifen  20 mg Oral Daily   Continuous Infusions: . sodium chloride    . lactated ringers     PRN Meds:.[MAR Hold] diphenhydrAMINE **AND** [MAR Hold] acetaminophen, [MAR Hold] albuterol, [MAR Hold] ALPRAZolam, [MAR Hold] prochlorperazine, [MAR Hold] sodium chloride flush  Allergies as of 12/03/2018 - Review Complete 12/03/2018  Allergen Reaction Noted  . Feraheme [ferumoxytol] Other (See Comments) 10/09/2017  . Nsaids Other (See Comments) 04/25/2014  . Tomato Hives 01/01/2012  . Wasp venom Swelling 10/06/2017    Family History  Problem Relation Age of Onset  . Cancer Mother        Ovarian  . Ovarian cancer Mother   . Diabetes Mother   . Kidney disease Mother   . Bleeding Disorder Mother   . Diabetes type II Sister   . Bleeding Disorder Sister   . Diabetes Sister   . Colon cancer Maternal Grandfather   . Crohn's disease Maternal Grandfather   . Stomach cancer Maternal Grandmother   . Kidney disease Son   . Bleeding Disorder Son   . Dysmenorrhea Neg Hx     Social History   Socioeconomic History  . Marital status: Single    Spouse name: Not on file  . Number of children: Not on file  . Years of education: Not on file  . Highest education level: Not on file  Occupational History  . Not on file  Social Needs  . Financial resource strain: Not on file  . Food insecurity    Worry: Not on file    Inability: Not on file  . Transportation needs    Medical: Not on file    Non-medical: Not on file   Tobacco Use  . Smoking status: Current Every Day Smoker    Packs/day: 0.50    Years: 30.00    Pack years: 15.00    Types: Cigarettes  . Smokeless tobacco: Former Systems developer    Types: Snuff    Quit date: 25  . Tobacco comment: 1/2 PPD  Substance and Sexual Activity  . Alcohol use: No    Alcohol/week: 0.0 standard drinks  . Drug use: No    Comment: last cocaine-2010  . Sexual activity: Yes    Birth control/protection: Surgical    Comment: Hysterectomy  Lifestyle  . Physical activity    Days per week: Not on file    Minutes per session: Not on file  . Stress: Not on file  Relationships  . Social connections    Talks on phone: Not on file  Gets together: Not on file    Attends religious service: Not on file    Active member of club or organization: Not on file    Attends meetings of clubs or organizations: Not on file    Relationship status: Not on file  . Intimate partner violence    Fear of current or ex partner: Not on file    Emotionally abused: Not on file    Physically abused: Not on file    Forced sexual activity: Not on file  Other Topics Concern  . Not on file  Social History Narrative  . Not on file    Review of Systems: All negative except as stated above in HPI.  Physical Exam: Vital signs: Vitals:   12/04/18 0115 12/04/18 0437  BP: 140/75 125/66  Pulse: 74 70  Resp: 20 18  Temp: 99.4 F (37.4 C) 98.4 F (36.9 C)  SpO2: 98% 94%   Last BM Date: 12/03/18 General:   Lethargic, obese, no acute distress Head: normocephalic, atraumatic Eyes: anicteric sclera ENT: oropharynx clear Neck: supple, nontender Lungs:  Clear throughout to auscultation.   No wheezes, crackles, or rhonchi. No acute distress. Heart:  Regular rate and rhythm; no murmurs, clicks, rubs,  or gallops. Abdomen: soft, nontender, nondistended, +BS  Rectal:  Deferred Ext: no edema  GI:  Lab Results: Recent Labs    12/03/18 1222 12/04/18 0643  WBC 9.5 9.8  HGB 5.0* 6.7*  6.7*   HCT 17.9* 23.1*  22.8*  PLT 215 207   BMET Recent Labs    12/03/18 1222 12/04/18 0643  NA 142 143  K 3.2* 3.5  CL 109 110  CO2 23 25  GLUCOSE 140* 122*  BUN 12 11  CREATININE 0.84 0.74  CALCIUM 8.1* 8.4*   LFT Recent Labs    12/03/18 1222  PROT 6.1*  ALBUMIN 3.0*  AST 11*  ALT 7  ALKPHOS 57  BILITOT 0.2*   PT/INR Recent Labs    12/04/18 0643  LABPROT 13.4  INR 1.0     Studies/Results: No results found.  Impression/Plan: Obscure GI bleeding likely from small bowel AVMs but needs a repeat EGD to look for gastric AVMs and/or recurrence of the Dieulafoy lesion. EGD today to further evaluate. Supportive care. Agree with additional blood transfusions. Continue PPI but may need to change to Protonix drip pending EGD findings.    LOS: 1 day   [name]  12/04/2018, 1:01 PM  Questions please call [phone]

## 2018-12-04 NOTE — Progress Notes (Signed)
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  She was intubated for airway protection due to the significant bleeding.  She received 3 units of blood last night, 1 unit this morning, and 2 more units in this afternoon.  I saw her in the ICU, she was on the vent, blood pressure high, she was sedated with propofol.    Objective:  Vitals:   12/04/18 1536 12/04/18 1600  BP: (!) 186/93 (!) 182/80  Pulse: (!) 106 (!) 102  Resp: (!) 31 (!) 25  Temp:  97.8 F (36.6 C)  SpO2: 99% 100%    Body mass index is 45.42 kg/m.  Intake/Output Summary (Last 24 hours) at 12/04/2018 1714 Last data filed at 12/04/2018 1700 Gross per 24 hour  Intake 2565.31 ml  Output 450 ml  Net 2115.31 ml     Sclerae unicteric  Lungs clear -- no rales or rhonchi  Heart regular rate and rhythm  Abdomen benign     CBG (last 3)  No results for input(s): GLUCAP in the last 72 hours.   Labs:  Lab Results  Component Value Date   WBC 9.8 12/04/2018   HGB 6.7 (LL) 12/04/2018   HGB 6.7 (LL) 12/04/2018   HCT 23.1 (L) 12/04/2018   HCT 22.8 (L) 12/04/2018   MCV 90.9 12/04/2018   PLT 207 12/04/2018   NEUTROABS 7.6 12/03/2018    Urine Studies No results for input(s): UHGB, CRYS in the last 72 hours.  Invalid input(s): UACOL, UAPR, USPG, UPH, UTP, UGL, UKET, UBIL, UNIT, UROB, ULEU, UEPI, UWBC, URBC, UBAC, CAST, UCOM, BILUA  Basic Metabolic Panel: Recent Labs  Lab 12/03/18 1222 12/04/18 0643  NA 142 143  K 3.2* 3.5  CL 109 110  CO2 23 25  GLUCOSE 140* 122*  BUN 12 11  CREATININE 0.84 0.74  CALCIUM 8.1* 8.4*  MG  --  2.1   GFR Estimated Creatinine Clearance: 102.5 mL/min (by C-G formula based on SCr of 0.74 mg/dL). Liver Function Tests: Recent Labs  Lab 12/03/18 1222  AST 11*  ALT 7  ALKPHOS 57   BILITOT 0.2*  PROT 6.1*  ALBUMIN 3.0*   No results for input(s): LIPASE, AMYLASE in the last 168 hours. No results for input(s): AMMONIA in the last 168 hours. Coagulation profile Recent Labs  Lab 12/04/18 0643  INR 1.0    CBC: Recent Labs  Lab 12/03/18 1222 12/04/18 0643  WBC 9.5 9.8  NEUTROABS 7.6  --   HGB 5.0* 6.7*  6.7*  HCT 17.9* 23.1*  22.8*  MCV 92.7 90.9  PLT 215 207   Cardiac Enzymes: No results for input(s): CKTOTAL, CKMB, CKMBINDEX, TROPONINI in the last 168 hours. BNP: Invalid input(s): POCBNP CBG: No results for input(s): GLUCAP in the last 168 hours. D-Dimer No results for input(s): DDIMER in the last 72 hours. Hgb A1c No results for input(s): HGBA1C in the last 72 hours. Lipid Profile Recent Labs    12/04/18 1635  TRIG 233*   Thyroid function studies No results for input(s): TSH, T4TOTAL, T3FREE, THYROIDAB in the last 72 hours.  Invalid input(s): FREET3 Anemia work up Recent Labs    12/03/18 1222  FERRITIN 89  TIBC 276  IRON 19*  RETICCTPCT 8.9*   Microbiology Recent Results (from the past 240  hour(s))  SARS Coronavirus 2 [facility] order, Performed in [facility] hospital lab) Nasopharyngeal Nasopharyngeal Swab     Status: None   Collection Time: 12/03/18  3:23 PM   Specimen: Nasopharyngeal Swab  Result Value Ref Range Status   SARS Coronavirus 2 NEGATIVE NEGATIVE Final    Comment: (NOTE) If result is NEGATIVE SARS-CoV-2 target nucleic acids are NOT DETECTED. The SARS-CoV-2 RNA is generally detectable in upper and lower  respiratory specimens during the acute phase of infection. The lowest  concentration of SARS-CoV-2 viral copies this assay can detect is 250  copies / mL. A negative result does not preclude SARS-CoV-2 infection  and should not be used as the sole basis for treatment or other  patient management decisions.  A negative result may occur with  improper specimen collection / handling, submission of specimen other   than nasopharyngeal swab, presence of viral mutation(s) within the  areas targeted by this assay, and inadequate number of viral copies  (<250 copies / mL). A negative result must be combined with clinical  observations, patient history, and epidemiological information. If result is POSITIVE SARS-CoV-2 target nucleic acids are DETECTED. The SARS-CoV-2 RNA is generally detectable in upper and lower  respiratory specimens dur ing the acute phase of infection.  Positive  results are indicative of active infection with SARS-CoV-2.  Clinical  correlation with patient history and other diagnostic information is  necessary to determine patient infection status.  Positive results do  not rule out bacterial infection or co-infection with other viruses. If result is PRESUMPTIVE POSTIVE SARS-CoV-2 nucleic acids MAY BE PRESENT.   A presumptive positive result was obtained on the submitted specimen  and confirmed on repeat testing.  While 2019 novel coronavirus  (SARS-CoV-2) nucleic acids may be present in the submitted sample  additional confirmatory testing may be necessary for epidemiological  and / or clinical management purposes  to differentiate between  SARS-CoV-2 and other Sarbecovirus currently known to infect humans.  If clinically indicated additional testing with an alternate test  methodology [phone]) is advised. The SARS-CoV-2 RNA is generally  detectable in upper and lower respiratory sp ecimens during the acute  phase of infection. The expected result is Negative. Fact Sheet for Patients:  StrictlyIdeas.no Fact Sheet for Healthcare Providers: BankingDealers.co.za This test is not yet approved or cleared by the [place] FDA and has been authorized for detection and/or diagnosis of SARS-CoV-2 by FDA under an Emergency Use Authorization (EUA).  This EUA will remain in effect (meaning this test can be used) for the duration of  the COVID-19 declaration under Section 564(b)(1) of the Act, 21 U.S.C. section 360bbb-3(b)(1), unless the authorization is terminated or revoked sooner. Performed at [facility], [place] [address], [place], [place] [address]       Studies:  Dg Chest Port 1 View  Result Date: 12/04/2018 CLINICAL DATA:  Status post endotracheal tube EXAM: PORTABLE CHEST 1 VIEW COMPARISON:  10/30/2018 FINDINGS: Cardiac shadow is enlarged. Right chest wall port is again seen and stable. Endotracheal tube is now seen at the level of the aortic knob in satisfactory position. Central vascular congestion is noted with increased opacity within the left lung likely representing asymmetric edema. IMPRESSION: Endotracheal tube in satisfactory position. Vascular congestion with patchy opacities throughout the left lung which may represent unilateral edema. Electronically Signed   By: Inez Catalina M.D.   On: 12/04/2018 16:42    Assessment: 58 y.o. with   1. Severe symptomatic anemia from GI  bleeding and iron deficiency 2.  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 Obesity  4.  Anxiety and depression 5. HTN     Plan:  -EGD today showed a dieulafoy lesion which was clipped and injected, large amount blood found, she is getting her 6th unit blood since admission yesterday  -watch CBC closely, and her stool output. If she has more GI bleeding, consider RBC nuclear scan and consult IR, she previously has gastric artery embolization at [facility].  -if her GI bleeding is difficult to control, we can consider to transfer her to [place] again, she was there for GI bleeding last year.  -continue tamoxifen and amicar when she is able to take oral meds, I will give iv dose amicar for the next 24 hr, I gave one dose iv iron this morning. Blood transfusion to keep Hg>8.0 -I will be out of office until next Wednesday, will ask our NP Erasmo Downer to see her on Monday. Please call my  on-call partner if needed.  -I called his son, but the listed phone number did not work. I did call her husband and updated him.   Truitt Merle, MD 12/04/2018

## 2018-12-04 NOTE — Progress Notes (Signed)
Clarification per Schorr, patient only to receive 2 units pRBCs as originally ordered.

## 2018-12-04 NOTE — Anesthesia Procedure Notes (Signed)
Procedure Name: Intubation Date/Time: 12/04/2018 2:06 PM Performed by: Niel Hummer, CRNA Pre-anesthesia Checklist: Patient identified, Emergency Drugs available, Suction available and Patient being monitored Patient Re-evaluated:Patient Re-evaluated prior to induction Oxygen Delivery Method: Circle system utilized Preoxygenation: Pre-oxygenation with 100% oxygen Induction Type: IV induction and Rapid sequence Ventilation: Oral airway inserted - appropriate to patient size and Two handed mask ventilation required Laryngoscope Size: Glidescope and 4 Grade View: Grade I Tube type: Oral Tube size: 7.0 mm Number of attempts: 2 Airway Equipment and Method: Stylet and Video-laryngoscopy Placement Confirmation: ETT inserted through vocal cords under direct vision,  positive ETCO2 and breath sounds checked- equal and bilateral Secured at: 24 cm Tube secured with: Tape Dental Injury: Teeth and Oropharynx as per pre-operative assessment  Comments: See quick note about intubation. Grade 1 view, difficulty visualization due to bleeding.

## 2018-12-04 NOTE — Interval H&P Note (Signed)
History and Physical Interval Note:  12/04/2018 1:25 PM  Peggy House  has presented today for surgery, with the diagnosis of GI bleed.  The various methods of treatment have been discussed with the patient and family. After consideration of risks, benefits and other options for treatment, the patient has consented to  Procedure(s): ESOPHAGOGASTRODUODENOSCOPY (EGD) WITH PROPOFOL (N/A) HOT HEMOSTASIS (ARGON PLASMA COAGULATION/BICAP) (N/A) as a surgical intervention.  The patient's history has been reviewed, patient examined, no change in status, stable for surgery.  I have reviewed the patient's chart and labs.  Questions were answered to the patient's satisfaction.     [name]

## 2018-12-05 ENCOUNTER — Inpatient Hospital Stay (HOSPITAL_COMMUNITY): Payer: Medicaid Other

## 2018-12-05 LAB — BASIC METABOLIC PANEL
Anion gap: 8 (ref 5–15)
BUN: 14 mg/dL (ref 6–20)
CO2: 22 mmol/L (ref 22–32)
Calcium: 7.8 mg/dL — ABNORMAL LOW (ref 8.9–10.3)
Chloride: 110 mmol/L (ref 98–111)
Creatinine, Ser: 0.93 mg/dL (ref 0.44–1.00)
GFR calc Af Amer: >60 mL/min (ref >60)
GFR calc non Af Amer: >60 mL/min (ref >60)
Glucose, Bld: 148 mg/dL — ABNORMAL HIGH (ref 70–99)
Potassium: 3.9 mmol/L (ref 3.5–5.1)
Sodium: 140 mmol/L (ref 135–145)

## 2018-12-05 LAB — BPAM RBC
Blood Product Expiration Date: 202009052359
Blood Product Expiration Date: 202009102359
Blood Product Expiration Date: 202009122359
ISSUE DATE / TIME: 202008211317
ISSUE DATE / TIME: 202008211415
ISSUE DATE / TIME: 202008211415
Unit Type and Rh: 600
Unit Type and Rh: 9500
Unit Type and Rh: 9500

## 2018-12-05 LAB — TYPE AND SCREEN
ABO/RH(D): A NEG
Antibody Screen: NEGATIVE
Unit division: 0
Unit division: 0
Unit division: 0

## 2018-12-05 LAB — CBC
HCT: 31.2 % — ABNORMAL LOW (ref 36.0–46.0)
Hemoglobin: 9.7 g/dL — ABNORMAL LOW (ref 12.0–15.0)
MCH: 28.6 pg (ref 26.0–34.0)
MCHC: 31.1 g/dL (ref 30.0–36.0)
MCV: 92 fL (ref 80.0–100.0)
Platelets: 200 10*3/uL (ref 150–400)
RBC: 3.39 MIL/uL — ABNORMAL LOW (ref 3.87–5.11)
RDW: 18.8 % — ABNORMAL HIGH (ref 11.5–15.5)
WBC: 25.2 10*3/uL — ABNORMAL HIGH (ref 4.0–10.5)
nRBC: 0 % (ref 0.0–0.2)

## 2018-12-05 LAB — MRSA PCR SCREENING: MRSA by PCR: NEGATIVE

## 2018-12-05 LAB — TRIGLYCERIDES: Triglycerides: 89 mg/dL (ref <150)

## 2018-12-05 LAB — HIV ANTIBODY (ROUTINE TESTING W REFLEX): HIV Screen 4th Generation wRfx: NONREACTIVE

## 2018-12-05 MED ORDER — PHENOL 1.4 % MT LIQD
1.0000 | OROMUCOSAL | Status: DC | PRN
  Filled 2018-12-05: qty 177

## 2018-12-05 MED ORDER — ORAL CARE MOUTH RINSE
15.0000 mL | OROMUCOSAL | Status: DC
  Administered 2018-12-05 (×6): 15 mL via OROMUCOSAL

## 2018-12-05 MED ORDER — CHLORHEXIDINE GLUCONATE 0.12% ORAL RINSE (MEDLINE KIT)
15.0000 mL | Freq: Two times a day (BID) | OROMUCOSAL | Status: DC
  Administered 2018-12-05 – 2018-12-06 (×5): 15 mL via OROMUCOSAL
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
## 2018-12-05 NOTE — Progress Notes (Addendum)
NAME:  Peggy House, MRN:  [id number], DOB:  [birth year], LOS: 2 ADMISSION DATE:  12/03/2018, CONSULTATION DATE: 12/04/2018 REFERRING MD: Dr. Erlinda Hong, CHIEF COMPLAINT: GI bleed, respiratory failure.  Brief History   Admitted 8/20, sent in from the hematology clinic for severe anemia Taken to the endoscopy suite where she had EGD-significant for dieulafoy lesions-she did have clippings and injections Required to be intubated for airway protection with significant expectoration of blood  History of present illness   She has a history of hemorrhagic telangiectasia, history of GI AVMs Noted to have a hemoglobin of 5 Had melena for a few days prior to presentation to the clinic-hematology   Past Medical History   Past Medical History:  Diagnosis Date  . Anxiety   . Arthritis    knnes,back  . GERD (gastroesophageal reflux disease)   . Hereditary hemorrhagic telangiectasia ([place])   . History of swelling of feet   . Hyperlipidemia   . Hypertension   . Major depressive disorder, recurrent episode ([place]) 06/05/2015  . Obesity   . Snores    Significant Hospital Events   Endoscopy 12/04/2018  Consults:  GI 12/04/2018  Procedures:  EGD 12/04/2018  -procedure note . Interim history/subjective:  No overnight events On ventilator Weaning well  Objective   Blood pressure (!) 124/54, pulse 60, temperature 97.9 F (36.6 C), temperature source Oral, resp. rate 17, height 5\' 5"  (1.651 m), weight 135.3 kg, SpO2 99 %.    Vent Mode: PRVC FiO2 (%):  [65 %-100 %] 65 % Set Rate:  [18 bmp-20 bmp] 20 bmp Vt Set:  [450 mL-500 mL] 500 mL PEEP:  [5 cmH20] 5 cmH20 Plateau Pressure:  [22 cmH20-29 cmH20] 22 cmH20   Intake/Output Summary (Last 24 hours) at 12/05/2018 [id number] Last data filed at 12/05/2018 0800 Gross per 24 hour  Intake 3224.7 ml  Output 625 ml  Net 2599.7 ml   Filed Weights   12/03/18 1508 12/04/18 1301 12/05/18 0500  Weight: 123.8 kg 123.8 kg 135.3 kg    Examination: General:  Obese, middle-aged, responsive HENT: Moist oral mucosa Lungs: Clear breath sounds Cardiovascular: S1-S2 appreciated Abdomen: Soft Extremities: No edema Neuro: Alert and oriented  Resolved Hospital Problem list     Assessment & Plan:  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 -Post endoscopy -On PPI drip -Post blood transfusions  Respiratory failure -Intubated for airway protection -Weaning very well at present -Should be able to liberate from the vent today  History of [name] 3 hemorrhagic telangiectasia -Hematology following -Home regimen included Benicar, tamoxifen, Avastin, iron supplementation  Hypertension -Hold home medications  Abnormal chest x-ray with left lung infiltrate -This is likely related to aspiration of blood during procedure yesterday -Has no fever, not manifesting any symptoms of an infection at present -We will monitor closely  She is tolerating weaning well Goal will be to extubate her today  Best practice:  Diet: N.p.o. Pain/Anxiety/Delirium protocol (if indicated): Fentanyl as needed VAP protocol (if indicated): In place DVT prophylaxis: SCD GI prophylaxis: On PPI Glucose control:  Mobility: Bedrest Code Status: Full code Family Communication: Pending Disposition:   Labs   CBC: Recent Labs  Lab 12/03/18 1222 12/04/18 0643 12/04/18 1811 12/05/18 0639  WBC 9.5 9.8 19.8* 25.2*  NEUTROABS 7.6  --   --   --   HGB 5.0* 6.7*  6.7* 9.7* 9.7*  HCT 17.9* 23.1*  22.8* 31.6* 31.2*  MCV 92.7 90.9 92.1 92.0  PLT 215 207 206 200  Basic Metabolic Panel: Recent Labs  Lab 12/03/18 1222 12/04/18 0643 12/04/18 1811 12/05/18 0639  NA 142 143 141 140  K 3.2* 3.5 3.8 3.9  CL 109 110 110 110  CO2 23 25 23 22   GLUCOSE 140* 122* 178* 148*  BUN 12 11 12 14   CREATININE 0.84 0.74 0.88 0.93  CALCIUM 8.1* 8.4* 7.9* 7.8*  MG  --  2.1  --   --    GFR: Estimated Creatinine Clearance: 93 mL/min (by C-G formula based on SCr of 0.93 mg/dL).  Recent Labs  Lab 12/03/18 1222 12/04/18 0643 12/04/18 1811 12/05/18 0639  WBC 9.5 9.8 19.8* 25.2*    Liver Function Tests: Recent Labs  Lab 12/03/18 1222 12/04/18 1811  AST 11* 22  ALT 7 14  ALKPHOS 57 51  BILITOT 0.2* 1.1  PROT 6.1* 6.2*  ALBUMIN 3.0* 3.0*   No results for input(s): LIPASE, AMYLASE in the last 168 hours. No results for input(s): AMMONIA in the last 168 hours.  ABG    Component Value Date/Time   PHART 7.253 (L) 12/04/2018 1950   PCO2ART 55.4 (H) 12/04/2018 1950   PO2ART 184 (H) 12/04/2018 1950   HCO3 23.8 12/04/2018 1950   TCO2 20 (L) 11/07/2017 2119   ACIDBASEDEF 3.3 (H) 12/04/2018 1950   O2SAT 99.3 12/04/2018 1950     Coagulation Profile: Recent Labs  Lab 12/04/18 0643  INR 1.0    Cardiac Enzymes: No results for input(s): CKTOTAL, CKMB, CKMBINDEX, TROPONINI in the last 168 hours.  HbA1C: No results found for: HGBA1C  CBG: No results for input(s): GLUCAP in the last 168 hours.  Review of Systems:   Denies pain or discomfort Limited by being on a ventilator at present  Past Medical History  She,  has a past medical history of Anxiety, Arthritis, GERD (gastroesophageal reflux disease), Hereditary hemorrhagic telangiectasia ([place]), History of swelling of feet, Hyperlipidemia, Hypertension, Major depressive disorder, recurrent episode ([place]) (06/05/2015), Obesity, and Snores.   Surgical History    Past Surgical History:  Procedure Laterality Date  . ABDOMINAL HYSTERECTOMY    . CARPAL TUNNEL RELEASE  05/13/2011   Procedure: CARPAL TUNNEL RELEASE;  Surgeon: Nita Sells, MD;  Location: [place];  Service: Orthopedics;  Laterality: Left;  . COLONOSCOPY WITH PROPOFOL N/A 04/28/2014   Procedure: COLONOSCOPY WITH PROPOFOL;  Surgeon: Cleotis Nipper, MD;  Location[facility] ENDOSCOPY;  Service: Endoscopy;  Laterality: N/A;  . DG TOES*L*  2/10   rt  . DILATION AND CURETTAGE OF UTERUS    . ENTEROSCOPY N/A 10/17/2017    Procedure: ENTEROSCOPY;  Surgeon: Otis Brace, MD;  Location: [place] ENDOSCOPY;  Service: Gastroenterology;  Laterality: N/A;  . ESOPHAGOGASTRODUODENOSCOPY N/A 04/10/2014   Procedure: ESOPHAGOGASTRODUODENOSCOPY (EGD);  Surgeon: [name], MD;  Location: [facility] ENDOSCOPY;  Service: Endoscopy;  Laterality: N/A;  . ESOPHAGOGASTRODUODENOSCOPY N/A 05/10/2017   Procedure: ESOPHAGOGASTRODUODENOSCOPY (EGD);  Surgeon: Ronald Lobo, MD;  Location: [facility] ENDOSCOPY;  Service: Endoscopy;  Laterality: N/A;  . ESOPHAGOGASTRODUODENOSCOPY N/A 09/22/2017   Procedure: ESOPHAGOGASTRODUODENOSCOPY (EGD);  Surgeon: Clarene Essex, MD;  Location: [place];  Service: Endoscopy;  Laterality: N/A;  bedside  . ESOPHAGOGASTRODUODENOSCOPY (EGD) WITH PROPOFOL N/A 04/27/2014   Procedure: ESOPHAGOGASTRODUODENOSCOPY (EGD) WITH PROPOFOL;  Surgeon: Cleotis Nipper, MD;  Location: [place];  Service: Endoscopy;  Laterality: N/A;  possible apc  . ESOPHAGOGASTRODUODENOSCOPY (EGD) WITH PROPOFOL N/A 09/30/2017   Procedure: ESOPHAGOGASTRODUODENOSCOPY (EGD) WITH PROPOFOL;  Surgeon: [name], MD;  Location: [place];  Service: Gastroenterology;  Laterality: N/A;  . ESOPHAGOGASTRODUODENOSCOPY (EGD) WITH PROPOFOL N/A 10/01/2017   Procedure: ESOPHAGOGASTRODUODENOSCOPY (EGD) WITH PROPOFOL;  Surgeon: [name], MD;  Location: [place];  Service: Gastroenterology;  Laterality: N/A;  . ESOPHAGOGASTRODUODENOSCOPY (EGD) WITH PROPOFOL N/A 10/08/2017   Procedure: ESOPHAGOGASTRODUODENOSCOPY (EGD) WITH PROPOFOL;  Surgeon: Otis Brace, MD;  Location: [facility] ENDOSCOPY;  Service: Gastroenterology;  Laterality: N/A;  . ESOPHAGOGASTRODUODENOSCOPY (EGD) WITH PROPOFOL N/A 10/17/2017   Procedure: ESOPHAGOGASTRODUODENOSCOPY (EGD) WITH PROPOFOL;  Surgeon: Otis Brace, MD;  Location: [facility] ENDOSCOPY;  Service: Gastroenterology;  Laterality: N/A;  . ESOPHAGOGASTRODUODENOSCOPY (EGD) WITH PROPOFOL N/A 10/19/2017   Procedure:  ESOPHAGOGASTRODUODENOSCOPY (EGD) WITH PROPOFOL;  Surgeon: Otis Brace, MD;  Location: [facility] ENDOSCOPY;  Service: Gastroenterology;  Laterality: N/A;  . GIVENS CAPSULE STUDY N/A 10/02/2017   Procedure: GIVENS CAPSULE STUDY;  Surgeon: [name], MD;  Location: [place];  Service: Gastroenterology;  Laterality: N/A;  . GIVENS CAPSULE STUDY N/A 10/08/2017   Procedure: GIVENS CAPSULE STUDY;  Surgeon: Otis Brace, MD;  Location: [place];  Service: Gastroenterology;  Laterality: N/A;  endoscopic placement of capsule  . HOT HEMOSTASIS N/A 04/27/2014   Procedure: HOT HEMOSTASIS (ARGON PLASMA COAGULATION/BICAP);  Surgeon: Cleotis Nipper, MD;  Location: [facility] ENDOSCOPY;  Service: Endoscopy;  Laterality: N/A;  . HOT HEMOSTASIS N/A 09/30/2017   Procedure: HOT HEMOSTASIS (ARGON PLASMA COAGULATION/BICAP);  Surgeon: [name], MD;  Location: [place];  Service: Gastroenterology;  Laterality: N/A;  . HOT HEMOSTASIS N/A 10/01/2017   Procedure: HOT HEMOSTASIS (ARGON PLASMA COAGULATION/BICAP);  Surgeon: [name], MD;  Location: [place];  Service: Gastroenterology;  Laterality: N/A;  . HOT HEMOSTASIS N/A 10/17/2017   Procedure: HOT HEMOSTASIS (ARGON PLASMA COAGULATION/BICAP);  Surgeon: Otis Brace, MD;  Location: [facility] ENDOSCOPY;  Service: Gastroenterology;  Laterality: N/A;  . HOT HEMOSTASIS N/A 10/19/2017   Procedure: HOT HEMOSTASIS (ARGON PLASMA COAGULATION/BICAP);  Surgeon: Otis Brace, MD;  Location: [facility] ENDOSCOPY;  Service: Gastroenterology;  Laterality: N/A;  . IR IMAGING GUIDED PORT INSERTION  07/08/2018  . L shoulder Surgery  2011  . SUBMUCOSAL INJECTION  09/22/2017   Procedure: SUBMUCOSAL INJECTION;  Surgeon: Clarene Essex, MD;  Location: [name] END[facility]COPY;  Service: Endoscopy;;     Social History   reports that she has been smoking cigarettes. She has a 15.00 pack-year smoking history. She quit smokeless tobacco use about 39 years ago.  Her smokeless tobacco use included snuff. She  reports that she does not drink alcohol or use drugs.   Family History   Her family history includes Bleeding Disorder in her mother, sister, and son; Cancer in her mother; Colon cancer in her maternal grandfather; Crohn's disease in her maternal grandfather; Diabetes in her mother and sister; Diabetes type II in her sister; Kidney disease in her mother and son; Ovarian cancer in her mother; Stomach cancer in her maternal grandmother. There is no history of Dysmenorrhea.   Allergies Allergies  Allergen Reactions  . Feraheme [Ferumoxytol] Other (See Comments)    Muscle tetany, encephalopathy, fatigue, nausea (reaction to injection)  . Nsaids Other (See Comments)    Stomach bleeding episodes; Tylenol is OK  . Tomato Hives  . Wasp Venom Swelling    The patient is critically ill with multiple organ systems failure and requires high complexity decision making for assessment and support, frequent evaluation and titration of therapies, application of advanced monitoring technologies and extensive interpretation of multiple databases. Critical Care Time devoted to patient care services described in this note independent of APP/resident time (if applicable)  is 32 minutes.  Sherrilyn Rist MD [place] [facility] Personal pager: [phone] If unanswered, please page CCM On-call: [phone]   Addendum: Chest x-ray shows significant improvement  Proceed with extubation

## 2018-12-05 NOTE — Procedures (Signed)
Extubation Procedure Note  Patient Details:   Name: Peggy House DOB: July 24, 1960 MRN: [id number]   Airway Documentation:  Airway 7 mm (Active)  Secured at (cm) 23 cm 12/05/18 0751  Measured From Lips 12/05/18 [place] 12/05/18 0751  Secured By [employer] 12/05/18 0751  Tube Holder Repositioned Yes 12/05/18 0400  Site Condition Dry 12/05/18 0751   Vent end date: 12/05/18 Vent end time: 1005   Evaluation  O2 sats: stable throughout Complications: No apparent complications Patient did tolerate procedure well. Bilateral Breath Sounds: Diminished   Yes    Extubated to [name] at 3 L. No stridor or distress noted.   Lamonte Sakai 12/05/2018, 10:10 AM

## 2018-12-05 NOTE — Progress Notes (Signed)
[facility] Gastroenterology Progress Note  Peggy House 58 y.o. [birth year]   Subjective: Extubated this morning. No black stools overnight. Feels ok.   Objective: Vital signs: Vitals:   12/05/18 1000 12/05/18 1100  BP: 135/61 123/85  Pulse:    Resp: 16 13  Temp:    SpO2:  99%  P 56, T 97.9  Physical Exam: Gen: lethargic, obese, no acute distress, pleasant HEENT: anicteric sclera CV: RRR Chest: CTA B Abd: epigastric tenderness with minimal guarding, soft, nondistended, +BS Ext: no edema  Lab Results: Recent Labs    12/04/18 0643 12/04/18 1811 12/05/18 0639  NA 143 141 140  K 3.5 3.8 3.9  CL 110 110 110  CO2 25 23 22   GLUCOSE 122* 178* 148*  BUN 11 12 14   CREATININE 0.74 0.88 0.93  CALCIUM 8.4* 7.9* 7.8*  MG 2.1  --   --    Recent Labs    12/03/18 1222 12/04/18 1811  AST 11* 22  ALT 7 14  ALKPHOS 57 51  BILITOT 0.2* 1.1  PROT 6.1* 6.2*  ALBUMIN 3.0* 3.0*   Recent Labs    12/03/18 1222  12/04/18 1811 12/05/18 0639  WBC 9.5   < > 19.8* 25.2*  NEUTROABS 7.6  --   --   --   HGB 5.0*   < > 9.7* 9.7*  HCT 17.9*   < > 31.6* 31.2*  MCV 92.7   < > 92.1 92.0  PLT 215   < > 206 200   < > = values in this interval not displayed.      Assessment/Plan: Upper GI bleed from Dieulafoy lesion - s/p injection and clipping with hemostasis achieved.Intubated yesterday for airway protection with large amount of blood and clots in stomach. Extubated this morning. No signs of ongoing bleeding. Hgb 9.7 following transfusions. Ice chips ok when ok with pulmonary (since recently extubated defer timing to them). Would not advance beyond clear liquids today. If rebleeding occurs, then will need embolization. Continue Protonix drip. Will f/u.    Peggy House 12/05/2018, 12:05 PM  Questions please call [phone] ID: Peggy House, female   DOB: 05-Mar-1961, 58 y.o.   MRN: [id number]

## 2018-12-06 DIAGNOSIS — J9601 Acute respiratory failure with hypoxia: Secondary | ICD-10-CM

## 2018-12-06 LAB — CBC
HCT: 29.9 % — ABNORMAL LOW (ref 36.0–46.0)
HCT: 31.6 % — ABNORMAL LOW (ref 36.0–46.0)
HCT: 25.4 % — ABNORMAL LOW (ref 36.0–46.0)
Hemoglobin: 9.1 g/dL — ABNORMAL LOW (ref 12.0–15.0)
Hemoglobin: 9.7 g/dL — ABNORMAL LOW (ref 12.0–15.0)
Hemoglobin: 7.6 g/dL — ABNORMAL LOW (ref 12.0–15.0)
MCH: 28.1 pg (ref 26.0–34.0)
MCH: 28.4 pg (ref 26.0–34.0)
MCH: 27.8 pg (ref 26.0–34.0)
MCHC: 30.4 g/dL (ref 30.0–36.0)
MCHC: 30.7 g/dL (ref 30.0–36.0)
MCHC: 29.9 g/dL — ABNORMAL LOW (ref 30.0–36.0)
MCV: 92.3 fL (ref 80.0–100.0)
MCV: 92.7 fL (ref 80.0–100.0)
MCV: 93 fL (ref 80.0–100.0)
Platelets: 211 10*3/uL (ref 150–400)
Platelets: 239 10*3/uL (ref 150–400)
Platelets: 163 10*3/uL (ref 150–400)
RBC: 3.24 MIL/uL — ABNORMAL LOW (ref 3.87–5.11)
RBC: 3.41 MIL/uL — ABNORMAL LOW (ref 3.87–5.11)
RBC: 2.73 MIL/uL — ABNORMAL LOW (ref 3.87–5.11)
RDW: 19.6 % — ABNORMAL HIGH (ref 11.5–15.5)
RDW: 19.6 % — ABNORMAL HIGH (ref 11.5–15.5)
RDW: 19.5 % — ABNORMAL HIGH (ref 11.5–15.5)
WBC: 19.2 10*3/uL — ABNORMAL HIGH (ref 4.0–10.5)
WBC: 18.2 10*3/uL — ABNORMAL HIGH (ref 4.0–10.5)
WBC: 12.1 10*3/uL — ABNORMAL HIGH (ref 4.0–10.5)
nRBC: 0.1 % (ref 0.0–0.2)
nRBC: 0.2 % (ref 0.0–0.2)
nRBC: 0.3 % — ABNORMAL HIGH (ref 0.0–0.2)

## 2018-12-06 LAB — BASIC METABOLIC PANEL
Anion gap: 9 (ref 5–15)
BUN: 11 mg/dL (ref 6–20)
CO2: 23 mmol/L (ref 22–32)
Calcium: 8.2 mg/dL — ABNORMAL LOW (ref 8.9–10.3)
Chloride: 111 mmol/L (ref 98–111)
Creatinine, Ser: 0.85 mg/dL (ref 0.44–1.00)
GFR calc Af Amer: >60 mL/min (ref >60)
GFR calc non Af Amer: >60 mL/min (ref >60)
Glucose, Bld: 96 mg/dL (ref 70–99)
Potassium: 3.5 mmol/L (ref 3.5–5.1)
Sodium: 143 mmol/L (ref 135–145)

## 2018-12-06 MED ORDER — ENALAPRILAT 1.25 MG/ML IV SOLN
0.6250 mg | Freq: Once | INTRAVENOUS | Status: AC
  Administered 2018-12-06: 0.625 mg via INTRAVENOUS
  Filled 2018-12-06: qty 1

## 2018-12-06 MED ORDER — AMLODIPINE BESYLATE 5 MG PO TABS
5.0000 mg | ORAL_TABLET | Freq: Every day | ORAL | Status: DC
  Administered 2018-12-06 – 2018-12-08 (×3): 5 mg via ORAL
  Filled 2018-12-06 (×3): qty 1

## 2018-12-06 MED ORDER — HYDRALAZINE HCL 20 MG/ML IJ SOLN
10.0000 mg | Freq: Four times a day (QID) | INTRAMUSCULAR | Status: DC | PRN
  Administered 2018-12-06 – 2018-12-07 (×3): 10 mg via INTRAVENOUS
  Filled 2018-12-06 (×3): qty 1

## 2018-12-06 MED ORDER — CHLORHEXIDINE GLUCONATE CLOTH 2 % EX PADS
6.0000 | MEDICATED_PAD | Freq: Every day | CUTANEOUS | Status: DC
  Administered 2018-12-06 – 2018-12-07 (×2): 6 via TOPICAL

## 2018-12-06 NOTE — Progress Notes (Signed)
[facility] Progress Note  AMATULLAH KASPARIAN 58 y.o. 08/25/1960   Subjective: Owens Shark stool reported by nursing this afternoon. Patient sitting in bedside chair. Denies abdominal pain. Tolerating clear liquids.  Objective: Vital signs: Vitals:   12/06/18 1222 12/06/18 1300  BP:  126/67  Pulse:    Resp:  19  Temp: 98.3 F (36.8 C)   SpO2:    P 62  Physical Exam: Gen: lethargic, no acute distress, obese, pleasant, no acute distress HEENT: anicteric sclera CV: RRR Chest: CTA B Abd: soft, nontender, nondistended, +BS, obese Ext: no edema  Lab Results: Recent Labs    12/04/18 0643  12/05/18 0639 12/06/18 0500  NA 143   < > 140 143  K 3.5   < > 3.9 3.5  CL 110   < > 110 111  CO2 25   < > 22 23  GLUCOSE 122*   < > 148* 96  BUN 11   < > 14 11  CREATININE 0.74   < > 0.93 0.85  CALCIUM 8.4*   < > 7.8* 8.2*  MG 2.1  --   --   --    < > = values in this interval not displayed.   Recent Labs    12/04/18 1811  AST 22  ALT 14  ALKPHOS 51  BILITOT 1.1  PROT 6.2*  ALBUMIN 3.0*   Recent Labs    12/05/18 0639 12/06/18 0500  WBC 25.2* 19.2*  HGB 9.7* 9.1*  HCT 31.2* 29.9*  MCV 92.0 92.3  PLT 200 211      Assessment/Plan: S/P Dieulafoy lesion bleed - hemostasis achieved with epinephrine:saline injection and hemoclipping and has been stable since EGD (12/04/18) without further bleeding. Hgb 9.1. Continue Protonix drip today and change to IV PPI Q 12 hours tomorrow if stable. Continue clear liquid diet and advance slowly starting tomorrow. Agree with transfer to floor. Dr. Oletta Lamas will f/u for [facility] tomorrow.   [name] 12/06/2018, 1:55 PM  Questions please call [phone] ID: MAYBRIE BRISBON, female   DOB: 1960-08-19, 57 y.o.   MRN: [id number]

## 2018-12-06 NOTE — Progress Notes (Addendum)
PROGRESS NOTE  Peggy House GUY:[id number] DOB: Sep 07, 1960 DOA: 12/03/2018 PCP: [name], MD  Brief History: Chief Complaint: hgb 5, melena, sent from hematology clinic to the ED  HPI: AVIYANA House is a 58 y.o. female   H/o hereditary hemorrhagic telangiectasia, h/o GI AVMs, sent from hematology clinic to the ED due to hgb 5 and melena. Cbc/bmp was done at the hematology clinic showed hgb 5, k 3.2, cr 0.84, she is sent to the ED for further evaluation. She reports had melena for a few days, she denies ab pain, no chest pain, no dizziness, does reports feeling tired and have sob   ED course: vital signs are stable. EDP ordered two units of blood, [facility] consulted, plan to do EGD in am, hospitalist called to admit the patient.  She aspirated gi content/blood during endoscope, developed hypoxia and she was intubated , transferred to icu, she improved, extubated on 8/22, transferred to hospitalist service on 8/23   HPI/Recap of past 24 hours:  She is extubated yesterday, she is currently on room air, denies sob, no chest pain, she dose has intermittent congested cough  Denies ab pain, no n/v, no bm No active bleed reported   Assessment/Plan: Active Problems:   Obesity, Class III, BMI 40-49.9 (morbid obesity) (HCC)   Symptomatic anemia   Acute blood loss anemia    Acute hypoxic respiratory failure from GI content aspiration during endoscope required intubation and icu care -improved , extubated, currently on room air -cxr with left lung infiltrate, she does has congested cough, leukocytosis, but no fever, will hold off abx for now, add on mucinex, if spike fever or leukocytosis does not improve, will consider rocephin to cover aspiration pna  Melena/acute blood loss anemia/iron deficiency anemia -H/o GI AVM , h/o double balloon enteroscopy in the past -prbc transfusion x5untis  -s/p EGD on 8/21 : Dieulafoy lesion of stomach with epinephrine injection and  hemostatic clips placement.  - Acute gastritis. -currently on ppi drip -hgb 9.1 -diet advancement per gi, currently on clears -management per GI, will follow gi recommendation   Hereditary hemorrhagic telangiectasia -home regimen: amicar, tamoxifen, avastin ( getting from [facility]) , iron supplement -hematology oncology Dr Burr Medico consulted, will follow recommendation  Hypokalemia: hold hctz, replace k prn, mag 2.1  HTN:  Home meds  norvasc, hctz  held on admission  resume norvasc on 8/23, continue hold hctz, d/c ivf hydralazine prn for sbp > 170  Class III obesity: Body mass index is 44.74 kg/m.  Right arm edema: likely  From iv infiltration , iv removed, elevate arm, venous doppler to rule out DVT  DVT prophylaxis: scd's  Consultants:  Critical care [facility] Dr Michail Sermon Hematology /oncology Dr Burr Medico  Code Status: full   Family Communication:  Patient   Disposition Plan: transfer out of ICU to med tele  Addendum: transfer cancelled due to large dark color bm later today, repeat hgb stable, monitor hgb, keep in stepdown today.  Procedures:  EGD on 8/21  Intubation on 8/21, extubation on 8/22  Antibiotics:  none   Objective: BP 140/70   Pulse 66   Temp 98.7 F (37.1 C) (Oral)   Resp 19   Ht _0  (1.651 m)   Wt 131.9 kg   SpO2 96%   BMI 48.39 kg/m   Intake/Output Summary (Last 24 hours) at 12/06/2018 1128 Last data filed at 12/06/2018 1000 Gross per 24 hour  Intake 2049.61 ml  Output 1250 ml  Net  799.61 ml   Filed Weights   12/04/18 1301 12/05/18 0500 12/06/18 0451  Weight: 123.8 kg 135.3 kg 131.9 kg    Exam: Patient is examined daily including today on 12/06/2018, exams remain the same as of yesterday except that has changed    General:  NAD  Cardiovascular: RRR  Respiratory: CTABL  Abdomen: Soft/ND/NT, positive BS  Musculoskeletal: No Edema  Neuro: alert, oriented   Data Reviewed: Basic Metabolic Panel: Recent Labs  Lab  12/03/18 1222 12/04/18 0643 12/04/18 1811 12/05/18 0639 12/06/18 0500  NA 142 143 141 140 143  K 3.2* 3.5 3.8 3.9 3.5  CL 109 110 110 110 111  CO2 _0 GLUCOSE 140* 122* 178* 148* 96  BUN _1 CREATININE 0.84 0.74 0.88 0.93 0.85  CALCIUM 8.1* 8.4* 7.9* 7.8* 8.2*  MG  --  2.1  --   --   --    Liver Function Tests: Recent Labs  Lab 12/03/18 1222 12/04/18 1811  AST 11* 22  ALT 7 14  ALKPHOS 57 51  BILITOT 0.2* 1.1  PROT 6.1* 6.2*  ALBUMIN 3.0* 3.0*   No results for input(s): LIPASE, AMYLASE in the last 168 hours. No results for input(s): AMMONIA in the last 168 hours. CBC: Recent Labs  Lab 12/03/18 1222 12/04/18 0643 12/04/18 1811 12/05/18 0639 12/06/18 0500  WBC 9.5 9.8 19.8* 25.2* 19.2*  NEUTROABS 7.6  --   --   --   --   HGB 5.0* 6.7*  6.7* 9.7* 9.7* 9.1*  HCT 17.9* 23.1*  22.8* 31.6* 31.2* 29.9*  MCV 92.7 90.9 92.1 92.0 92.3  PLT 215 207 206 200 211   Cardiac Enzymes:   No results for input(s): CKTOTAL, CKMB, CKMBINDEX, TROPONINI in the last 168 hours. BNP (last 3 results) No results for input(s): BNP in the last 8760 hours.  ProBNP (last 3 results) No results for input(s): PROBNP in the last 8760 hours.  CBG: No results for input(s): GLUCAP in the last 168 hours.  Recent Results (from the past 240 hour(s))  SARS Coronavirus 2 [facility] order, Performed in [facility] hospital lab) Nasopharyngeal Nasopharyngeal Swab     Status: None   Collection Time: 12/03/18  3:23 PM   Specimen: Nasopharyngeal Swab  Result Value Ref Range Status   SARS Coronavirus 2 NEGATIVE NEGATIVE Final    Comment: (NOTE) If result is NEGATIVE SARS-CoV-2 target nucleic acids are NOT DETECTED. The SARS-CoV-2 RNA is generally detectable in upper and lower  respiratory specimens during the acute phase of infection. The lowest  concentration of SARS-CoV-2 viral copies this assay can detect is 250  copies / mL. A negative result does not preclude SARS-CoV-2  infection  and should not be used as the sole basis for treatment or other  patient management decisions.  A negative result may occur with  improper specimen collection / handling, submission of specimen other  than nasopharyngeal swab, presence of viral mutation(s) within the  areas targeted by this assay, and inadequate number of viral copies  (<250 copies / mL). A negative result must be combined with clinical  observations, patient history, and epidemiological information. If result is POSITIVE SARS-CoV-2 target nucleic acids are DETECTED. The SARS-CoV-2 RNA is generally detectable in upper and lower  respiratory specimens dur ing the acute phase of infection.  Positive  results are indicative of active infection with SARS-CoV-2.  Clinical  correlation with patient history and other diagnostic information is  necessary to determine patient infection status.  Positive results do  not rule out bacterial infection or co-infection with other viruses. If result is PRESUMPTIVE POSTIVE SARS-CoV-2 nucleic acids MAY BE PRESENT.   A presumptive positive result was obtained on the submitted specimen  and confirmed on repeat testing.  While 2019 novel coronavirus  (SARS-CoV-2) nucleic acids may be present in the submitted sample  additional confirmatory testing may be necessary for epidemiological  and / or clinical management purposes  to differentiate between  SARS-CoV-2 and other Sarbecovirus currently known to infect humans.  If clinically indicated additional testing with an alternate test  methodology [phone]) is advised. The SARS-CoV-2 RNA is generally  detectable in upper and lower respiratory sp ecimens during the acute  phase of infection. The expected result is Negative. Fact Sheet for Patients:  StrictlyIdeas.no Fact Sheet for Healthcare Providers: BankingDealers.co.za This test is not yet approved or cleared by the [place]  FDA and has been authorized for detection and/or diagnosis of SARS-CoV-2 by FDA under an Emergency Use Authorization (EUA).  This EUA will remain in effect (meaning this test can be used) for the duration of the COVID-19 declaration under Section 564(b)(1) of the Act, 21 U.S.C. section 360bbb-3(b)(1), unless the authorization is terminated or revoked sooner. Performed at [facility], [place] [address] [place], [place] [address]   MRSA PCR Screening     Status: None   Collection Time: 12/05/18  6:15 PM   Specimen: Nasopharyngeal  Result Value Ref Range Status   MRSA by PCR NEGATIVE NEGATIVE Final    Comment:        The GeneXpert MRSA Assay (FDA approved for NASAL specimens only), is one component of a comprehensive MRSA colonization surveillance program. It is not intended to diagnose MRSA infection nor to guide or monitor treatment for MRSA infections. Performed at [facility], [place] [address] [place], [place] [address]      Studies: No results found.  Scheduled Meds: . aminocaproic acid  1 g Oral Q8H  . atorvastatin  80 mg Oral Daily  . chlorhexidine gluconate (MEDLINE KIT)  15 mL Mouth Rinse BID  . Chlorhexidine Gluconate Cloth  6 each Topical Daily  . fentaNYL (SUBLIMAZE) injection  50 mcg Intravenous Once  . guaiFENesin  600 mg Oral BID  . LORazepam  2 mg Intravenous Once  . [START ON 12/08/2018] pantoprazole  40 mg Intravenous Q12H  . sertraline  50 mg Oral Daily  . tamoxifen  20 mg Oral Daily    Continuous Infusions: . sodium chloride Stopped (12/04/18 1737)  . fentaNYL infusion INTRAVENOUS Stopped (12/05/18 1004)  . lactated ringers 50 mL/hr at 12/06/18 1000  . lactated ringers    . pantoprozole (PROTONIX) infusion 8 mg/hr (12/06/18 1000)  . propofol (DIPRIVAN) infusion Stopped (12/04/18 1859)     Time spent: 64mns I have personally reviewed and interpreted on  12/06/2018 daily labs, tele strips, imagings as  discussed above under date review session and assessment and plans.  I reviewed all nursing notes, pharmacy notes, consultant notes,  vitals, pertinent old records  I have discussed plan of care as described above with RN , patient  on 12/06/2018   FFlorencia ReasonsMD, PhD, FACP  [facility] Pager [phone] If 7PM-7AM, please contact night-coverage at [email or web address], password [email or web address]8/23/2020, 11:28 AM  LOS: 3 days

## 2018-12-06 NOTE — Progress Notes (Signed)
CCM will sign off May be transferred to medical floor from my perspective Service transfer to hospitalist service

## 2018-12-07 ENCOUNTER — Encounter (HOSPITAL_COMMUNITY): Payer: Self-pay | Admitting: Gastroenterology

## 2018-12-07 ENCOUNTER — Inpatient Hospital Stay (HOSPITAL_COMMUNITY): Payer: Medicaid Other

## 2018-12-07 DIAGNOSIS — D649 Anemia, unspecified: Secondary | ICD-10-CM | POA: Diagnosis present

## 2018-12-07 DIAGNOSIS — K3182 Dieulafoy lesion (hemorrhagic) of stomach and duodenum: Secondary | ICD-10-CM | POA: Diagnosis present

## 2018-12-07 DIAGNOSIS — I82621 Acute embolism and thrombosis of deep veins of right upper extremity: Secondary | ICD-10-CM | POA: Diagnosis not present

## 2018-12-07 DIAGNOSIS — J690 Pneumonitis due to inhalation of food and vomit: Secondary | ICD-10-CM | POA: Diagnosis present

## 2018-12-07 DIAGNOSIS — M1990 Unspecified osteoarthritis, unspecified site: Secondary | ICD-10-CM | POA: Diagnosis not present

## 2018-12-07 DIAGNOSIS — I10 Essential (primary) hypertension: Secondary | ICD-10-CM

## 2018-12-07 DIAGNOSIS — D500 Iron deficiency anemia secondary to blood loss (chronic): Secondary | ICD-10-CM | POA: Diagnosis present

## 2018-12-07 DIAGNOSIS — R609 Edema, unspecified: Secondary | ICD-10-CM

## 2018-12-07 LAB — CBC WITH DIFFERENTIAL/PLATELET
Abs Immature Granulocytes: 0.11 10*3/uL — ABNORMAL HIGH (ref 0.00–0.07)
Basophils Absolute: 0 10*3/uL (ref 0.0–0.1)
Basophils Relative: 0 %
Eosinophils Absolute: 0.1 10*3/uL (ref 0.0–0.5)
Eosinophils Relative: 1 %
HCT: 33.5 % — ABNORMAL LOW (ref 36.0–46.0)
Hemoglobin: 10 g/dL — ABNORMAL LOW (ref 12.0–15.0)
Immature Granulocytes: 1 %
Lymphocytes Relative: 8 %
Lymphs Abs: 1.1 10*3/uL (ref 0.7–4.0)
MCH: 27.2 pg (ref 26.0–34.0)
MCHC: 29.9 g/dL — ABNORMAL LOW (ref 30.0–36.0)
MCV: 91.3 fL (ref 80.0–100.0)
Monocytes Absolute: 0.7 10*3/uL (ref 0.1–1.0)
Monocytes Relative: 5 %
Neutro Abs: 12 10*3/uL — ABNORMAL HIGH (ref 1.7–7.7)
Neutrophils Relative %: 85 %
Platelets: 263 10*3/uL (ref 150–400)
RBC: 3.67 MIL/uL — ABNORMAL LOW (ref 3.87–5.11)
RDW: 19.5 % — ABNORMAL HIGH (ref 11.5–15.5)
WBC: 14 10*3/uL — ABNORMAL HIGH (ref 4.0–10.5)
nRBC: 0.2 % (ref 0.0–0.2)

## 2018-12-07 LAB — BASIC METABOLIC PANEL
Anion gap: 12 (ref 5–15)
BUN: 8 mg/dL (ref 6–20)
CO2: 22 mmol/L (ref 22–32)
Calcium: 9 mg/dL (ref 8.9–10.3)
Chloride: 105 mmol/L (ref 98–111)
Creatinine, Ser: 0.76 mg/dL (ref 0.44–1.00)
GFR calc Af Amer: >60 mL/min (ref >60)
GFR calc non Af Amer: >60 mL/min (ref >60)
Glucose, Bld: 114 mg/dL — ABNORMAL HIGH (ref 70–99)
Potassium: 3.4 mmol/L — ABNORMAL LOW (ref 3.5–5.1)
Sodium: 139 mmol/L (ref 135–145)

## 2018-12-07 LAB — MAGNESIUM: Magnesium: 2 mg/dL (ref 1.7–2.4)

## 2018-12-07 MED ORDER — HYDROCHLOROTHIAZIDE 12.5 MG PO CAPS
12.5000 mg | ORAL_CAPSULE | Freq: Every day | ORAL | Status: DC
  Administered 2018-12-07 – 2018-12-08 (×2): 12.5 mg via ORAL
  Filled 2018-12-07 (×2): qty 1

## 2018-12-07 MED ORDER — PANTOPRAZOLE SODIUM 40 MG PO TBEC
40.0000 mg | DELAYED_RELEASE_TABLET | Freq: Two times a day (BID) | ORAL | Status: DC
  Administered 2018-12-08: 40 mg via ORAL
  Filled 2018-12-07: qty 1

## 2018-12-07 MED ORDER — OXYMETAZOLINE HCL 0.05 % NA SOLN
1.0000 | Freq: Two times a day (BID) | NASAL | Status: DC | PRN
  Administered 2018-12-07: 11:00:00 2 via NASAL
  Filled 2018-12-07: qty 15

## 2018-12-07 NOTE — Progress Notes (Addendum)
PROGRESS NOTE  Peggy House [id number] DOB: Nov 18, 1960 DOA: 12/03/2018 PCP: [name], MD  Brief History: Chief Complaint: hgb 5, melena, sent from hematology clinic to the ED  HPI: Peggy House is a 58 y.o. female   H/o hereditary hemorrhagic telangiectasia, h/o GI AVMs, sent from hematology clinic to the ED due to hgb 5 and melena. Cbc/bmp was done at the hematology clinic showed hgb 5, k 3.2, cr 0.84, she is sent to the ED for further evaluation. She reports had melena for a few days, she denies ab pain, no chest pain, no dizziness, does reports feeling tired and have sob   ED course: vital signs are stable. EDP ordered two units of blood, [facility] consulted, plan to do EGD in am, hospitalist called to admit the patient.  She aspirated gi content/blood during endoscope, developed hypoxia and she was intubated , transferred to icu, she improved, extubated on 8/22, transferred to hospitalist service on 8/23   HPI/Recap of past 24 hours:  Patient had some nosebleeds this am, we ordered prn Afrin nasal spray she has used this in the past to help. Currently has tissue in the R nostril and is not bleeding. Hb 10 today, no c/o w/ patient. No CP/ SOB.   Assessment/Plan: Principal Problem:   Anemia due to GI blood loss Active Problems:   Acute on chronic blood loss anemia   Hereditary hemorrhagic telangiectasia (HCC)   Symptomatic anemia   Aspiration pneumonitis (HCC)   Dieulafoy lesion of stomach   Essential hypertension   Venous stasis dermatitis of both lower extremities   Acute hypoxemic respiratory failure (HCC)   HLD (hyperlipidemia)   Obesity, Class III, BMI 40-49.9 (morbid obesity) ([place])   Melena/acute blood loss anemia/iron deficiency anemia -H/o GI AVM , h/o double balloon enteroscopy in the past -prbc transfusion x5 units on 8/20- 8/21  -s/p EGD on 8/21 : Dieulafoy lesion of stomach rx'd w epinephrine injection and hemostatic clip placement; also  acute gastritis. -currently on ppi drip -hgb 9.1 > 10 today, stable -diet advancement: per gi advancing today from clears to full liquids   Acute hypoxic respiratory failure from GI content aspiration during endoscope required intubation and icu care -improved , extubated, currently on room air -cxr with left lung infiltrate, she does has congested cough, leukocytosis, but no fever, will hold off abx for now, add on mucinex, if spike fever or leukocytosis does not improve, will consider rocephin to cover aspiration pna  Acute DVT R SCV/ IJ  - spoke w/ CCM who help w/ resp failure over w/e > they felt she is not candidate for anticoagulation due to GIB's and that the port if working could be left in place, they suggested getting Hem-Onc opinion as well; he added that there may be an SVC filter similar to IVC filter, if needed we can ask IR about this - Dr Lindi Adie on call for Heme is going to come by - so will not anticoagulate due to dangers of concomitant GI Bleed (and prior hx or recurrent bleeds); will leave port in place for now since still working, consider removing/ placing on other side if symptoms (arm swelling) don't resolve over time  Hereditary hemorrhagic telangiectasia -home regimen: benicar, tamoxifen, avastin ( getting from [facility]) , iron supplement  Hypokalemia: hold hctz, replace k prn, mag 2.1  HTN:  Home meds  norvasc, hctz  held on admission  resumed norvasc on 8/23 Resume hctz today hydralazine prn for sbp >  170  Class III obesity: Body mass index is 44.74 kg/m.  Right arm edema: RUE doppler 8/24 today showed >  Right: findings consistent with acute deep vein thrombosis involving the right internal jugular vein and right subclavian vein. Left: No evidence of thrombosis in the subclavian  -- will discuss above w/ CCM, pt has port in R chest which could be impacting this  DVT prophylaxis: scd's  Consultants:  Critical care [facility] Dr Michail Sermon   Code  Status: full   Family Communication:  Patient   Disposition Plan: transfer out of ICU to med tele today    Kelly Splinter MD  [facility] [phone] 12/07/2018, 2:53 PM   Procedures:  EGD on 8/21  Intubation on 8/21, extubation on 8/22  Antibiotics:  none   Objective: BP (!) 167/73    Pulse 70    Temp 98 F (36.7 C) (Oral)    Resp 20    Ht 5\' 5"  (1.651 m)    Wt 131.9 kg    SpO2 100%    BMI 48.39 kg/m   Intake/Output Summary (Last 24 hours) at 12/07/2018 1426 Last data filed at 12/07/2018 1231 Gross per 24 hour  Intake 494.25 ml  Output 4050 ml  Net -3555.75 ml   Filed Weights   12/05/18 0500 12/06/18 0451 12/07/18 0209  Weight: 135.3 kg 131.9 kg 131.9 kg    Exam: Patient is examined daily including today on 12/07/2018, exams remain the same as of yesterday except that has changed    General:  NAD  Cardiovascular: RRR  Respiratory: CTABL  Abdomen: Soft/ND/NT, positive BS  Musculoskeletal: No Edema  Neuro: alert, oriented   Data Reviewed: Basic Metabolic Panel: Recent Labs  Lab 12/04/18 0643 12/04/18 1811 12/05/18 0639 12/06/18 0500 12/07/18 0500  NA 143 141 140 143 139  K 3.5 3.8 3.9 3.5 3.4*  CL 110 110 110 111 105  CO2 25 23 22 23 22   GLUCOSE 122* 178* 148* 96 114*  BUN 11 12 14 11 8   CREATININE 0.74 0.88 0.93 0.85 0.76  CALCIUM 8.4* 7.9* 7.8* 8.2* 9.0  MG 2.1  --   --   --  2.0   Liver Function Tests: Recent Labs  Lab 12/03/18 1222 12/04/18 1811  AST 11* 22  ALT 7 14  ALKPHOS 57 51  BILITOT 0.2* 1.1  PROT 6.1* 6.2*  ALBUMIN 3.0* 3.0*   No results for input(s): LIPASE, AMYLASE in the last 168 hours. No results for input(s): AMMONIA in the last 168 hours. CBC: Recent Labs  Lab 12/03/18 1222  12/05/18 0639 12/06/18 0500 12/06/18 1502 12/06/18 2104 12/07/18 0500  WBC 9.5   < > 25.2* 19.2* 18.2* 12.1* 14.0*  NEUTROABS 7.6  --   --   --   --   --  12.0*  HGB 5.0*   < > 9.7* 9.1* 9.7* 7.6* 10.0*  HCT 17.9*   < > 31.2* 29.9*  31.6* 25.4* 33.5*  MCV 92.7   < > 92.0 92.3 92.7 93.0 91.3  PLT 215   < > 200 211 239 163 263   < > = values in this interval not displayed.   Cardiac Enzymes:   No results for input(s): CKTOTAL, CKMB, CKMBINDEX, TROPONINI in the last 168 hours. BNP (last 3 results) No results for input(s): BNP in the last 8760 hours.  ProBNP (last 3 results) No results for input(s): PROBNP in the last 8760 hours.  CBG: No results for input(s): GLUCAP in  the last 168 hours.  Recent Results (from the past 240 hour(s))  SARS Coronavirus 2 [facility] order, Performed in [facility] hospital lab) Nasopharyngeal Nasopharyngeal Swab     Status: None   Collection Time: 12/03/18  3:23 PM   Specimen: Nasopharyngeal Swab  Result Value Ref Range Status   SARS Coronavirus 2 NEGATIVE NEGATIVE Final    Comment: (NOTE) If result is NEGATIVE SARS-CoV-2 target nucleic acids are NOT DETECTED. The SARS-CoV-2 RNA is generally detectable in upper and lower  respiratory specimens during the acute phase of infection. The lowest  concentration of SARS-CoV-2 viral copies this assay can detect is 250  copies / mL. A negative result does not preclude SARS-CoV-2 infection  and should not be used as the sole basis for treatment or other  patient management decisions.  A negative result may occur with  improper specimen collection / handling, submission of specimen other  than nasopharyngeal swab, presence of viral mutation(s) within the  areas targeted by this assay, and inadequate number of viral copies  (<250 copies / mL). A negative result must be combined with clinical  observations, patient history, and epidemiological information. If result is POSITIVE SARS-CoV-2 target nucleic acids are DETECTED. The SARS-CoV-2 RNA is generally detectable in upper and lower  respiratory specimens dur ing the acute phase of infection.  Positive  results are indicative of active infection with SARS-CoV-2.  Clinical  correlation with  patient history and other diagnostic information is  necessary to determine patient infection status.  Positive results do  not rule out bacterial infection or co-infection with other viruses. If result is PRESUMPTIVE POSTIVE SARS-CoV-2 nucleic acids MAY BE PRESENT.   A presumptive positive result was obtained on the submitted specimen  and confirmed on repeat testing.  While 2019 novel coronavirus  (SARS-CoV-2) nucleic acids may be present in the submitted sample  additional confirmatory testing may be necessary for epidemiological  and / or clinical management purposes  to differentiate between  SARS-CoV-2 and other Sarbecovirus currently known to infect humans.  If clinically indicated additional testing with an alternate test  methodology [phone]) is advised. The SARS-CoV-2 RNA is generally  detectable in upper and lower respiratory sp ecimens during the acute  phase of infection. The expected result is Negative. Fact Sheet for Patients:  StrictlyIdeas.no Fact Sheet for Healthcare Providers: BankingDealers.co.za This test is not yet approved or cleared by the [place] FDA and has been authorized for detection and/or diagnosis of SARS-CoV-2 by FDA under an Emergency Use Authorization (EUA).  This EUA will remain in effect (meaning this test can be used) for the duration of the COVID-19 declaration under Section 564(b)(1) of the Act, 21 U.S.C. section 360bbb-3(b)(1), unless the authorization is terminated or revoked sooner. Performed at [facility], [place] [address] [place], [place] [address]   MRSA PCR Screening     Status: None   Collection Time: 12/05/18  6:15 PM   Specimen: Nasopharyngeal  Result Value Ref Range Status   MRSA by PCR NEGATIVE NEGATIVE Final    Comment:        The GeneXpert MRSA Assay (FDA approved for NASAL specimens only), is one component of a comprehensive MRSA  colonization surveillance program. It is not intended to diagnose MRSA infection nor to guide or monitor treatment for MRSA infections. Performed at [facility], [place] [address] [place], [place] [address]      Studies: Vas [place] Upper Extremity Venous Duplex  Result Date: 12/07/2018 UPPER  VENOUS STUDY  Indications: Swelling Comparison Study: No prior study. Performing Technologist: Maudry Mayhew MHA, RDMS, RVT, RDCS  Examination Guidelines: A complete evaluation includes B-mode imaging, spectral Doppler, color Doppler, and power Doppler as needed of all accessible portions of each vessel. Bilateral testing is considered an integral part of a complete examination. Limited examinations for reoccurring indications may be performed as noted.  Right Findings: +----------+------------+---------+-----------+----------+--------------+  RIGHT      Compressible Phasicity Spontaneous Properties    Summary      +----------+------------+---------+-----------+----------+--------------+  IJV            None                   No                     Acute       +----------+------------+---------+-----------+----------+--------------+  Subclavian     None                   No                     Acute       +----------+------------+---------+-----------+----------+--------------+  Axillary       Full        Yes        Yes                                +----------+------------+---------+-----------+----------+--------------+  Brachial       Full        Yes        Yes                                +----------+------------+---------+-----------+----------+--------------+  Radial         Full                                                      +----------+------------+---------+-----------+----------+--------------+  Ulnar          Full                                                      +----------+------------+---------+-----------+----------+--------------+  Cephalic       Full                                                       +----------+------------+---------+-----------+----------+--------------+  Basilic                                                  Not visualized  +----------+------------+---------+-----------+----------+--------------+  Left Findings: +----------+------------+---------+-----------+----------+-------+  LEFT       Compressible Phasicity Spontaneous Properties Summary  +----------+------------+---------+-----------+----------+-------+  IJV  Yes                         +----------+------------+---------+-----------+----------+-------+  Subclavian                 Yes        Yes                         +----------+------------+---------+-----------+----------+-------+  Summary:  Right: Findings consistent with acute deep vein thrombosis involving the right internal jugular vein and right subclavian vein.  Left: No evidence of thrombosis in the subclavian.  *See table(s) above for measurements and observations.    Preliminary     Scheduled Meds:  aminocaproic acid  1 g Oral Q8H   amLODipine  5 mg Oral Daily   atorvastatin  80 mg Oral Daily   Chlorhexidine Gluconate Cloth  6 each Topical Daily   guaiFENesin  600 mg Oral BID   [START ON 12/08/2018] pantoprazole  40 mg Intravenous Q12H   sertraline  50 mg Oral Daily   tamoxifen  20 mg Oral Daily    Continuous Infusions:  sodium chloride Stopped (12/04/18 1737)   pantoprozole (PROTONIX) infusion 8 mg/hr (12/07/18 1231)      [facility] Pager [phone]. If 7PM-7AM, please contact night-coverage at [email or web address], password [facility] 12/07/2018, 2:26 PM  LOS: 4 days

## 2018-12-07 NOTE — Progress Notes (Addendum)
Peggy House   DOB:06-15-1960   [id number]   [id number]  Hematology follow up  Subjective: The patient has a nosebleed at the time of my visit.  Noted to have large clots coming out of her nose.  Nursing was able to apply pressure and get the bleeding to stop.  The patient reports that she has frequent nosebleeds and had one earlier this morning as well.  She has not noticed any other bleeding other than her nosebleeds.  She has no chest discomfort or shortness of breath.  I was told by nursing during the visit, that due to the patient's right arm swelling, a Doppler ultrasound was performed this morning which showed findings consistent with acute DVT involving the right internal jugular vein and right subclavian vein.  She is not currently on anticoagulation.  She remains on tamoxifen and Amicar.  Objective:  Vitals:   12/07/18 1000 12/07/18 1100  BP: (!) 152/76 (!) 167/66  Pulse: 67 71  Resp: 20 18  Temp:    SpO2: 100% 100%    Body mass index is 48.39 kg/m.  Intake/Output Summary (Last 24 hours) at 12/07/2018 1156 Last data filed at 12/07/2018 0800 Gross per 24 hour  Intake 644.2 ml  Output 3150 ml  Net -2505.8 ml      General: Awake and alert, having a nosebleed at the time of my visit.    HEENT: Sclera anicteric  Lungs clear -- no rales or rhonchi  Heart regular rate and rhythm  Abdomen: Soft, nontender   Right-sided Port-A-Cath without redness or swelling    CBG (last 3)  No results for input(s): GLUCAP in the last 72 hours.   Labs:  Lab Results  Component Value Date   WBC 14.0 (H) 12/07/2018   HGB 10.0 (L) 12/07/2018   HCT 33.5 (L) 12/07/2018   MCV 91.3 12/07/2018   PLT 263 12/07/2018   NEUTROABS 12.0 (H) 12/07/2018    Urine Studies No results for input(s): UHGB, CRYS in the last 72 hours.  Invalid input(s): UACOL, UAPR, USPG, UPH, UTP, UGL, UKET, UBIL, UNIT, UROB, ULEU, UEPI, UWBC, URBC, [place], [place]  Basic Metabolic Panel: Recent Labs   Lab 12/04/18 0643 12/04/18 1811 12/05/18 0639 12/06/18 0500 12/07/18 0500  NA 143 141 140 143 139  K 3.5 3.8 3.9 3.5 3.4*  CL 110 110 110 111 105  CO2 25 23 22 23 22   GLUCOSE 122* 178* 148* 96 114*  BUN 11 12 14 11 8   CREATININE 0.74 0.88 0.93 0.85 0.76  CALCIUM 8.4* 7.9* 7.8* 8.2* 9.0  MG 2.1  --   --   --  2.0   GFR Estimated Creatinine Clearance: 106.6 mL/min (by C-G formula based on SCr of 0.76 mg/dL). Liver Function Tests: Recent Labs  Lab 12/03/18 1222 12/04/18 1811  AST 11* 22  ALT 7 14  ALKPHOS 57 51  BILITOT 0.2* 1.1  PROT 6.1* 6.2*  ALBUMIN 3.0* 3.0*   No results for input(s): LIPASE, AMYLASE in the last 168 hours. No results for input(s): AMMONIA in the last 168 hours. Coagulation profile Recent Labs  Lab 12/04/18 0643  INR 1.0    CBC: Recent Labs  Lab 12/03/18 1222  12/05/18 0639 12/06/18 0500 12/06/18 1502 12/06/18 2104 12/07/18 0500  WBC 9.5   < > 25.2* 19.2* 18.2* 12.1* 14.0*  NEUTROABS 7.6  --   --   --   --   --  12.0*  HGB 5.0*   < >  9.7* 9.1* 9.7* 7.6* 10.0*  HCT 17.9*   < > 31.2* 29.9* 31.6* 25.4* 33.5*  MCV 92.7   < > 92.0 92.3 92.7 93.0 91.3  PLT 215   < > 200 211 239 163 263   < > = values in this interval not displayed.   Cardiac Enzymes: No results for input(s): CKTOTAL, CKMB, CKMBINDEX, TROPONINI in the last 168 hours. BNP: Invalid input(s): POCBNP CBG: No results for input(s): GLUCAP in the last 168 hours. D-Dimer No results for input(s): DDIMER in the last 72 hours. Hgb A1c No results for input(s): HGBA1C in the last 72 hours. Lipid Profile Recent Labs    12/04/18 1635 12/05/18 0639  TRIG 233* 89   Thyroid function studies No results for input(s): TSH, T4TOTAL, T3FREE, THYROIDAB in the last 72 hours.  Invalid input(s): FREET3 Anemia work up No results for input(s): VITAMINB12, FOLATE, FERRITIN, TIBC, IRON, RETICCTPCT in the last 72 hours. Microbiology Recent Results (from the past 240 hour(s))  SARS  Coronavirus 2 [facility] order, Performed in [facility] hospital lab) Nasopharyngeal Nasopharyngeal Swab     Status: None   Collection Time: 12/03/18  3:23 PM   Specimen: Nasopharyngeal Swab  Result Value Ref Range Status   SARS Coronavirus 2 NEGATIVE NEGATIVE Final    Comment: (NOTE) If result is NEGATIVE SARS-CoV-2 target nucleic acids are NOT DETECTED. The SARS-CoV-2 RNA is generally detectable in upper and lower  respiratory specimens during the acute phase of infection. The lowest  concentration of SARS-CoV-2 viral copies this assay can detect is 250  copies / mL. A negative result does not preclude SARS-CoV-2 infection  and should not be used as the sole basis for treatment or other  patient management decisions.  A negative result may occur with  improper specimen collection / handling, submission of specimen other  than nasopharyngeal swab, presence of viral mutation(s) within the  areas targeted by this assay, and inadequate number of viral copies  (<250 copies / mL). A negative result must be combined with clinical  observations, patient history, and epidemiological information. If result is POSITIVE SARS-CoV-2 target nucleic acids are DETECTED. The SARS-CoV-2 RNA is generally detectable in upper and lower  respiratory specimens dur ing the acute phase of infection.  Positive  results are indicative of active infection with SARS-CoV-2.  Clinical  correlation with patient history and other diagnostic information is  necessary to determine patient infection status.  Positive results do  not rule out bacterial infection or co-infection with other viruses. If result is PRESUMPTIVE POSTIVE SARS-CoV-2 nucleic acids MAY BE PRESENT.   A presumptive positive result was obtained on the submitted specimen  and confirmed on repeat testing.  While 2019 novel coronavirus  (SARS-CoV-2) nucleic acids may be present in the submitted sample  additional confirmatory testing may be necessary  for epidemiological  and / or clinical management purposes  to differentiate between  SARS-CoV-2 and other Sarbecovirus currently known to infect humans.  If clinically indicated additional testing with an alternate test  methodology [phone]) is advised. The SARS-CoV-2 RNA is generally  detectable in upper and lower respiratory sp ecimens during the acute  phase of infection. The expected result is Negative. Fact Sheet for Patients:  StrictlyIdeas.no Fact Sheet for Healthcare Providers: BankingDealers.co.za This test is not yet approved or cleared by the [place] FDA and has been authorized for detection and/or diagnosis of SARS-CoV-2 by FDA under an Emergency Use Authorization (EUA).  This EUA will remain in effect (meaning  this test can be used) for the duration of the COVID-19 declaration under Section 564(b)(1) of the Act, 21 U.S.C. section 360bbb-3(b)(1), unless the authorization is terminated or revoked sooner. Performed at [facility], [place] [address], [place], [place] [address]   MRSA PCR Screening     Status: None   Collection Time: 12/05/18  6:15 PM   Specimen: Nasopharyngeal  Result Value Ref Range Status   MRSA by PCR NEGATIVE NEGATIVE Final    Comment:        The GeneXpert MRSA Assay (FDA approved for NASAL specimens only), is one component of a comprehensive MRSA colonization surveillance program. It is not intended to diagnose MRSA infection nor to guide or monitor treatment for MRSA infections. Performed at [facility] [place] [address], [place], [place] [address]       Studies:  Vas [place] Upper Extremity Venous Duplex  Result Date: 12/07/2018 UPPER VENOUS STUDY  Indications: Swelling Comparison Study: No prior study. Performing Technologist: Maudry Mayhew MHA, RDMS, RVT, RDCS  Examination Guidelines: A complete evaluation includes B-mode imaging, spectral Doppler,  color Doppler, and power Doppler as needed of all accessible portions of each vessel. Bilateral testing is considered an integral part of a complete examination. Limited examinations for reoccurring indications may be performed as noted.  Right Findings: +----------+------------+---------+-----------+----------+--------------+ RIGHT     CompressiblePhasicitySpontaneousProperties   Summary     +----------+------------+---------+-----------+----------+--------------+ IJV           None                 No                   Acute      +----------+------------+---------+-----------+----------+--------------+ Subclavian    None                 No                   Acute      +----------+------------+---------+-----------+----------+--------------+ Axillary      Full       Yes       Yes                             +----------+------------+---------+-----------+----------+--------------+ Brachial      Full       Yes       Yes                             +----------+------------+---------+-----------+----------+--------------+ Radial        Full                                                 +----------+------------+---------+-----------+----------+--------------+ Ulnar         Full                                                 +----------+------------+---------+-----------+----------+--------------+ Cephalic      Full                                                 +----------+------------+---------+-----------+----------+--------------+  Basilic                                             Not visualized +----------+------------+---------+-----------+----------+--------------+  Left Findings: +----------+------------+---------+-----------+----------+-------+ LEFT      CompressiblePhasicitySpontaneousPropertiesSummary +----------+------------+---------+-----------+----------+-------+ IJV                                Yes                       +----------+------------+---------+-----------+----------+-------+ Subclavian               Yes       Yes                      +----------+------------+---------+-----------+----------+-------+  Summary:  Right: Findings consistent with acute deep vein thrombosis involving the right internal jugular vein and right subclavian vein.  Left: No evidence of thrombosis in the subclavian.  *See table(s) above for measurements and observations.    Preliminary     Assessment: 58 y.o. with   1. Severe symptomatic anemia from GI bleeding and iron deficiency 2.  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 Obesity  4.  Anxiety and depression 5. HTN  6.  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  Has received 6 units of packed red blood cells this admission.  Hemoglobin is stable.  No transfusion recommended today.  Transfuse packed red blood cells to keep her hemoglobin above 8.0. -Watch CBC closely, and her stool output. If she has more GI bleeding, consider RBC nuclear scan and consult IR, she previously has gastric artery embolization at [facility].  -if her GI bleeding is difficult to control, we can consider to transfer her to [place] again, she was there for GI bleeding last year.  -continue tamoxifen and amicar. -Given her high risk of GI bleed and nosebleeds, I would recommend against anticoagulation for her DVT at this time. -If epistaxis persists, recommend ENT consultation.  Mikey Bussing, NP 12/07/2018    Attending Note  I personally saw the patient, reviewed the chart and examined the patient.  Severe GI bleeding due to hemorrhagic telangiectasia status post epinephrine injection and hemostatic clip placement on 12/04/2018. Patient is a port. Right subclavian and internal jugular DVT: Unable to anticoagulate because of  recent GI bleeding.  She also has profound nosebleeds.  Plan: Transfusion support, supportive care. Once her clinical situation gets better, she would likely benefit from anticoagulation. If her clinical situation continues to remain in the same, we may have to consider transfer to [facility] where she had been to previously.  This is per Dr. Ernestina Penna recommendation. Very difficult and challenging situation.

## 2018-12-07 NOTE — Progress Notes (Signed)
Gastroenterologist, Dr. Oletta Lamas, contacted in regards to protonix drip order. Per Dr. Oletta Lamas, continue current bag of protonix then start 40 mg PO protonix BID starting 12/08/2018. Will carry out orders. Will continue to monitor.

## 2018-12-07 NOTE — Progress Notes (Signed)
VAS [place] upper extremity duplex performed by [place] tech. Per tech, positive for clot in right IJ. MD made aware. Awaiting further orders.  Will continue to monitor.

## 2018-12-07 NOTE — Progress Notes (Signed)
Right upper extremity venous duplex completed. Refer to "CV Proc" under chart review to view preliminary results.  Critical results discussed with Adele Barthel, RN, who confirmed she will relay preliminary results to MD.  12/07/2018 9:52 AM Maudry Mayhew, MHA, RVT, RDCS, RDMS

## 2018-12-07 NOTE — Evaluation (Signed)
Physical Therapy Evaluation Patient Details Name: Peggy House MRN: [id number] DOB: [birth year] Today's Date: 12/07/2018   History of Present Illness  Pt admitted with GIB and Hgb of 5.0.  Pt with hx of MDD and Hemorrhagic Telangiectasia.  Pt aspirated blood/GI contents during endoscope and developed hypoxia requiring placement on vent.  Pt extubated 12/05/22.  Clinical Impression  Pt admitted as above and presenting with functional mobility limitations 2* generalized weakness and ambulatory balance deficits.  Pt should progress to dc home with assist of family.    Follow Up Recommendations Home health PT    Equipment Recommendations  None recommended by PT    Recommendations for Other Services       Precautions / Restrictions Precautions Precautions: Fall Restrictions Weight Bearing Restrictions: No      Mobility  Bed Mobility               General bed mobility comments: Pt up in chair and requests back to same  Transfers Overall transfer level: Needs assistance   Transfers: Sit to/from Stand Sit to Stand: Min guard         General transfer comment: cues for use of UEs to self assist and steady assist for balance on initial standing  Ambulation/Gait Ambulation/Gait assistance: Min assist;Min guard Gait Distance (Feet): 150 Feet Assistive device: Rolling walker (2 wheeled) Gait Pattern/deviations: Step-through pattern;Decreased step length - right;Decreased step length - left;Shuffle;Trunk flexed Gait velocity: decr   General Gait Details: Increased time with pt requiring multiple standing rest breaks for task completion.  Cues for posture, position from RW and pace  Stairs            Wheelchair Mobility    Modified Rankin (Stroke Patients Only)       Balance Overall balance assessment: Needs assistance Sitting-balance support: No upper extremity supported;Feet supported Sitting balance-Leahy Scale: Good     Standing balance support:  Bilateral upper extremity supported Standing balance-Leahy Scale: Poor                               Pertinent Vitals/Pain Pain Assessment: No/denies pain    Home Living Family/patient expects to be discharged to:: Private residence Living Arrangements: Spouse/significant other Available Help at Discharge: Family Type of Home: House Home Access: Stairs to enter Entrance Stairs-Rails: Right Entrance Stairs-Number of Steps: 3 Home Layout: One level Home Equipment: Environmental consultant - 2 wheels      Prior Function Level of Independence: Independent               Hand Dominance        Extremity/Trunk Assessment   Upper Extremity Assessment Upper Extremity Assessment: Generalized weakness    Lower Extremity Assessment Lower Extremity Assessment: Generalized weakness       Communication   Communication: No difficulties  Cognition Arousal/Alertness: Awake/alert Behavior During Therapy: WFL for tasks assessed/performed Overall Cognitive Status: Within Functional Limits for tasks assessed                                        General Comments      Exercises     Assessment/Plan    PT Assessment Patient needs continued PT services  PT Problem List Decreased strength;Decreased activity tolerance;Decreased balance;Decreased mobility;Decreased knowledge of use of DME;Obesity       PT Treatment Interventions DME instruction;Gait training;Stair training;Functional  mobility training;Therapeutic activities;Therapeutic exercise;Patient/family education;Balance training    PT Goals (Current goals can be found in the Care Plan section)  Acute Rehab PT Goals Patient Stated Goal: Regain IND PT Goal Formulation: With patient Time For Goal Achievement: 12/20/18 Potential to Achieve Goals: Good    Frequency Min 3X/week   Barriers to discharge        Co-evaluation               AM-PAC PT "6 Clicks" Mobility  Outcome Measure Help needed  turning from your back to your side while in a flat bed without using bedrails?: A Little Help needed moving from lying on your back to sitting on the side of a flat bed without using bedrails?: A Little Help needed moving to and from a bed to a chair (including a wheelchair)?: A Little Help needed standing up from a chair using your arms (e.g., wheelchair or bedside chair)?: A Little Help needed to walk in hospital room?: A Little Help needed climbing 3-5 steps with a railing? : A Lot 6 Click Score: 17    End of Session   Activity Tolerance: Patient tolerated treatment well Patient left: in chair;with call bell/phone within reach Nurse Communication: Mobility status PT Visit Diagnosis: Difficulty in walking, not elsewhere classified (R26.2);Muscle weakness (generalized) (M62.81)    Time: 1415-1440 PT Time Calculation (min) (ACUTE ONLY): 25 min   Charges:   PT Evaluation $PT Eval Low Complexity: 1 Low PT Treatments $Gait Training: 8-22 mins        [place] Pager [phone] Office [phone]   Jennesis Ramaswamy 12/07/2018, 4:03 PM

## 2018-12-07 NOTE — Progress Notes (Signed)
[facility] PROGRESS NOTE Subjective Pt without any signs of bleeding. Tolerating clears.  Objective: Vital signs in last 24 hours: Temp:  [98 F (36.7 C)-98.3 F (36.8 C)] 98.3 F (36.8 C) (08/24 0400) Pulse Rate:  [55-75] 57 (08/24 0623) Resp:  [19-28] 20 (08/24 0623) BP: (125-186)/(56-97) 180/75 (08/24 0600) SpO2:  [93 %-100 %] 98 % (08/24 0623) Weight:  [131.9 kg] 131.9 kg (08/24 0209) Last BM Date: 12/03/18  Intake/Output from previous day: 08/23 0701 - 08/24 0700 In: 892.3 [I.V.:892.3] Out: 3400 [Urine:3400] Intake/Output this shift: No intake/output data recorded.  PE: General--in bed, no distress  Abdomen--soft nontender, obese  Lab Results: Recent Labs    12/05/18 0639 12/06/18 0500 12/06/18 1502 12/06/18 2104 12/07/18 0500  WBC 25.2* 19.2* 18.2* 12.1* 14.0*  HGB 9.7* 9.1* 9.7* 7.6* 10.0*  HCT 31.2* 29.9* 31.6* 25.4* 33.5*  PLT 200 211 239 163 263   BMET Recent Labs    12/04/18 1811 12/05/18 0639 12/06/18 0500 12/07/18 0500  NA 141 140 143 139  K 3.8 3.9 3.5 3.4*  CL 110 110 111 105  CO2 23 22 23 22   CREATININE 0.88 0.93 0.85 0.76   LFT Recent Labs    12/04/18 1811  PROT 6.2*  AST 22  ALT 14  ALKPHOS 51  BILITOT 1.1   PT/INR No results for input(s): LABPROT, INR in the last 72 hours. PANCREAS No results for input(s): LIPASE in the last 72 hours.       Studies/Results: Dg Chest Port 1 View  Result Date: 12/05/2018 CLINICAL DATA:  Pneumonia EXAM: PORTABLE CHEST 1 VIEW COMPARISON:  12/04/2018 FINDINGS: Increased interstitial markings. Asymmetric left lung opacities favor pneumonia over interstitial edema. No definite pleural effusions. Mild cardiomegaly. Right chest port terminates in the right atrium. Endotracheal tube terminates 3 cm above the carina. IMPRESSION: Endotracheal tube terminates 3 cm above the carina. Multifocal interstitial/patchy opacities, left lung predominant, favoring pneumonia over asymmetric  interstitial edema. Electronically Signed   By: Julian Hy M.D.   On: 12/05/2018 11:07    Medications: I have reviewed the patient's current medications.  Assessment:   1. UGI Bleed due to Dieulafoy lesion. No signs of further bleeding.Hg up. 3 days since clipping tolerating liquids.    Plan: Will go ahead and start on full liquids, avoid more high residue foods to prevent possible knocking off the clip   Nancy Fetter 12/07/2018, 8:31 AM  This note was created using voice recognition software. Minor errors may Have occurred unintentionally.  Pager: [phone] If no answer or after hours call [phone]

## 2018-12-07 NOTE — Progress Notes (Signed)
Patient with continuous nose bleeds. MD made aware. Afrin requested by patient, per patient, has previously helped with nose bleeds. Will continue to monitor.

## 2018-12-08 DIAGNOSIS — M1990 Unspecified osteoarthritis, unspecified site: Secondary | ICD-10-CM | POA: Diagnosis not present

## 2018-12-08 LAB — CBC
HCT: 32.8 % — ABNORMAL LOW (ref 36.0–46.0)
Hemoglobin: 10.1 g/dL — ABNORMAL LOW (ref 12.0–15.0)
MCH: 27.8 pg (ref 26.0–34.0)
MCHC: 30.8 g/dL (ref 30.0–36.0)
MCV: 90.4 fL (ref 80.0–100.0)
Platelets: 242 10*3/uL (ref 150–400)
RBC: 3.63 MIL/uL — ABNORMAL LOW (ref 3.87–5.11)
RDW: 18.8 % — ABNORMAL HIGH (ref 11.5–15.5)
WBC: 13.3 10*3/uL — ABNORMAL HIGH (ref 4.0–10.5)
nRBC: 0 % (ref 0.0–0.2)

## 2018-12-08 MED ORDER — HEPARIN SOD (PORK) LOCK FLUSH 100 UNIT/ML IV SOLN
500.0000 [IU] | INTRAVENOUS | Status: AC | PRN
  Administered 2018-12-08: 16:00:00 500 [IU]

## 2018-12-08 MED ORDER — SERTRALINE HCL 50 MG PO TABS: 50.0000 mg | ORAL_TABLET | Freq: Every day | ORAL | 0 refills | Status: DC

## 2018-12-08 MED ORDER — PANTOPRAZOLE SODIUM 40 MG PO TBEC: 40.0000 mg | DELAYED_RELEASE_TABLET | Freq: Two times a day (BID) | ORAL | 5 refills | Status: DC

## 2018-12-08 NOTE — Progress Notes (Signed)
[facility] PROGRESS NOTE Subjective Patient tolerating full liquids no signs of any further bleeding.  She has not had a transfusion in over 3 days and her hemoglobin is risen to 10.1.  She has had issues with DVT and her bleeding some type of SVC/IVC filter.  Given her chronic bleeding hereditary telangiectasia anticoagulation probably would not feasible going forward.  She is still in the ICU but they are waiting for a bed to transfer her.  She is on a full liquid diet with something more substantial  Objective: Vital signs in last 24 hours: Temp:  [97.3 F (36.3 C)-98.5 F (36.9 C)] 97.3 F (36.3 C) (08/25 0800) Pulse Rate:  [63-77] 74 (08/25 1000) Resp:  [15-24] 18 (08/25 1000) BP: (129-196)/(45-120) 153/120 (08/25 1000) SpO2:  [95 %-100 %] 100 % (08/25 1000) Weight:  [131.9 kg] 131.9 kg (08/25 0333) Last BM Date: 12/07/18  Intake/Output from previous day: 08/24 0701 - 08/25 0700 In: 323 [I.V.:323] Out: 3450 [Urine:3450] Intake/Output this shift: No intake/output data recorded.  PE: General--alert African American female in no distress   Lungs--clear  abdomen--obese but nontender  Lab Results: Recent Labs    12/06/18 0500 12/06/18 1502 12/06/18 2104 12/07/18 0500 12/08/18 0412  WBC 19.2* 18.2* 12.1* 14.0* 13.3*  HGB 9.1* 9.7* 7.6* 10.0* 10.1*  HCT 29.9* 31.6* 25.4* 33.5* 32.8*  PLT 211 239 163 263 242   BMET Recent Labs    12/06/18 0500 12/07/18 0500  NA 143 139  K 3.5 3.4*  CL 111 105  CO2 23 22  CREATININE 0.85 0.76   LFT No results for input(s): PROT, AST, ALT, ALKPHOS, BILITOT, BILIDIR, IBILI in the last 72 hours. PT/INR No results for input(s): LABPROT, INR in the last 72 hours. PANCREAS No results for input(s): LIPASE in the last 72 hours.       Studies/Results: Vas [place] Upper Extremity Venous Duplex  Result Date: 12/07/2018 UPPER VENOUS STUDY  Indications: Swelling Limitations: Body habitus and poor ultrasound/tissue interface.  Comparison Study: No prior study. Performing Technologist: Maudry Mayhew MHA, RDMS, RVT, RDCS  Examination Guidelines: A complete evaluation includes B-mode imaging, spectral Doppler, color Doppler, and power Doppler as needed of all accessible portions of each vessel. Bilateral testing is considered an integral part of a complete examination. Limited examinations for reoccurring indications may be performed as noted.  Right Findings: +----------+------------+---------+-----------+----------+--------------+ RIGHT     CompressiblePhasicitySpontaneousProperties   Summary     +----------+------------+---------+-----------+----------+--------------+ IJV           None                 No                   Acute      +----------+------------+---------+-----------+----------+--------------+ Subclavian    None                 No                   Acute      +----------+------------+---------+-----------+----------+--------------+ Axillary      Full       Yes       Yes                             +----------+------------+---------+-----------+----------+--------------+ Brachial      Full       Yes       Yes                             +----------+------------+---------+-----------+----------+--------------+  Radial        Full                                                 +----------+------------+---------+-----------+----------+--------------+ Ulnar         Full                                                 +----------+------------+---------+-----------+----------+--------------+ Cephalic      Full                                                 +----------+------------+---------+-----------+----------+--------------+ Basilic                                             Not visualized +----------+------------+---------+-----------+----------+--------------+  Left Findings: +----------+------------+---------+-----------+----------+-------+ LEFT       CompressiblePhasicitySpontaneousPropertiesSummary +----------+------------+---------+-----------+----------+-------+ IJV                                Yes                      +----------+------------+---------+-----------+----------+-------+ Subclavian               Yes       Yes                      +----------+------------+---------+-----------+----------+-------+  Summary:  Right: Findings consistent with acute deep vein thrombosis involving the right internal jugular vein and right subclavian vein.  Left: No evidence of thrombosis in the subclavian.  *See table(s) above for measurements and observations.  Diagnosing physician: Ruta Hinds MD Electronically signed by Ruta Hinds MD on 12/07/2018 at 6:55:05 PM.    Final     Medications: I have reviewed the patient's current medications.  Assessment:   1.  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  Was treated endoscopically 4 days ago with epinephrine and clipping.  She has had no signs of further bleeding.If she has further bleeding will need a bleeding scan and consider IR.  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 2.  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  DVT in the right internal jugular right subclavian vein   Plan: I have changed her to oral PPI. I suggest advancing her to mechanically soft diet which she continues to remain stable. Agree with bleeding scan her bleeding recurs.   Nancy Fetter 12/08/2018, 12:16 PM  This note was created using voice recognition software. Minor errors may Have occurred unintentionally.  Pager: [phone] If no answer or after hours call [phone]

## 2018-12-08 NOTE — Discharge Summary (Signed)
Physician Discharge Summary  Patient ID: Peggy House MRN: [id number] DOB/AGE: June 14, 1960 58 y.o.  Admit date: 12/03/2018 Discharge date: 12/08/2018  Admission Diagnoses:  Principal Problem:   Anemia due to GI blood loss   Upper GI bleed   Acute on chronic blood loss anemia   Hereditary hemorrhagic telangiectasia (HCC)   Symptomatic anemia   Essential hypertension   Venous stasis dermatitis of both lower extremities   HLD (hyperlipidemia)   Obesity, Class III, BMI 40-49.9 (morbid obesity) ([place])   Discharge Diagnoses:  Principal Problem:   Anemia due to GI blood loss Active Problems:   Acute on chronic blood loss anemia   Hereditary hemorrhagic telangiectasia (HCC)   Symptomatic anemia   Aspiration pneumonitis (HCC)   Dieulafoy lesion of stomach   Acute deep vein thrombosis (DVT) of right upper extremity (HCC)   Essential hypertension   Venous stasis dermatitis of both lower extremities   Acute hypoxemic respiratory failure (HCC)   HLD (hyperlipidemia)   Obesity, Class III, BMI 40-49.9 (morbid obesity) ([place])   Discharged Condition: good  HPI: Peggy House is a 58 y.o. female. H/o hereditary hemorrhagic telangiectasia, h/o GI AVMs, sent from hematology clinic to the ED due to hgb 5 and melena. Cbc/bmp was done at the hematology clinic showed hgb 5, k 3.2, cr 0.84, she is sent to the ED for further evaluation. She reports had melena for a few days, she denies ab pain, no chest pain, no dizziness, does reports feeling tired and have sob.  ED course: vital signs are stable. EDP ordered two units of blood, [facility] consulted, plan to do EGD in am, hospitalist called to admit the patient.  Hospital Course/ Problems  Melena/acute blood loss anemia/ Dieulafoy gastric lesions sp injection and clipping 8/21 -H/o GI AVM , h/o double balloon enteroscopy in the past -prbc transfusion x5 units on 8/20- 8/21 , no further transfusoin needed -s/p EGD on 8/21 : Dieulafoy lesion of stomach  rx'd w epinephrine injection and hemostatic clip placement; also acute gastritis. -Rx'd w IV PPI drip > converted to PPI bid po today -hgb stable around 10 -diet advanced and ready for dc - f/u w/ GI and PCP and Heme   Acute hypoxic respiratory failure from GI content aspiration during endoscope required intubation and icu care -improved , extubated, currently on room air  Acute DVT R IJ and subclavian vein: RUE doppler 8/24 (Right: findings consistent with acute deep vein thrombosis involving the right internal jugular vein and right subclavian vein. Left: No evidence of thrombosis in the subclavian)  - not a candidate for anticoagulation per CCM and per Heme-Onc, supportive care - edema R are resolved after resuming HCTZ had large diuresis, Port still working fine  Hereditary hemorrhagic telangiectasia -home regimen: benicar, tamoxifen, avastin ( getting from [facility]) , iron supplement   HTN:  Home meds  norvasc, hctz held on admission  resumed norvasc Resumed hctz    Class III obesity: Body mass index is 44.74 kg/m.    Consultants: Critical care Charletta Cousin  Procedures:  EGD on 8/21  Intubation on 8/21, extubation on 8/22  Antibiotics:  none   Discharge Exam: Blood pressure (!) 162/87, pulse 63, temperature (!) 97.3 F (36.3 C), temperature source Oral, resp. rate 19, height 5\' 5"  (1.651 m), weight 131.9 kg, SpO2 96 %.  General:  NAD, obese   Eyes: PERRL  ENT: unremarkable  Neck: supple, no JVD  Cardiovascular: RRR  Respiratory: CTABL, port to right chest  Abdomen: soft/NT/ND, positive bowel sounds  Skin: no rash  Musculoskeletal:  No edema or UE's or LE's  Psychiatric: calm/cooperative  Neurologic: no focal findings    Disposition: Discharge disposition: 01-Home or Self Care        Allergies as of 12/08/2018      Reactions   Feraheme [ferumoxytol] Other (See Comments)   Muscle tetany, encephalopathy, fatigue,  nausea (reaction to injection)   Nsaids Other (See Comments)   Stomach bleeding episodes; Tylenol is OK   Tomato Hives   Wasp Venom Swelling      Medication List    STOP taking these medications   ferrous gluconate 324 MG tablet Commonly known as: FERGON     TAKE these medications   albuterol 108 (90 Base) MCG/ACT inhaler Commonly known as: VENTOLIN HFA Inhale 1-2 puffs into the lungs every 6 (six) hours as needed for wheezing or shortness of breath.   ALPRAZolam 0.5 MG tablet Commonly known as: XANAX TAKE 1 TABLET BY MOUTH 2 TIMES A DAY AS NEEDED FOR ANXIETY What changed:   See the new instructions.  Another medication with the same name was removed. Continue taking this medication, and follow the directions you see here.   Aminocaproic Acid 1000 MG Tabs Commonly known as: Amicar Take 1 tablet (1,000 mg total) by mouth 2 (two) times daily.   amLODipine 10 MG tablet Commonly known as: NORVASC TAKE 1 TABLET(10 MG) BY MOUTH DAILY What changed: See the new instructions.   atorvastatin 80 MG tablet Commonly known as: LIPITOR TAKE 1 TABLET(80 MG) BY MOUTH DAILY What changed:   how much to take  how to take this  when to take this  additional instructions   diphenhydramine-acetaminophen 25-500 MG Tabs tablet Commonly known as: TYLENOL PM Take 1 tablet by mouth at bedtime as needed (sleep).   guaifenesin 100 MG/5ML syrup Commonly known as: ROBITUSSIN Take 200 mg by mouth 3 (three) times daily as needed for cough or congestion.   hydrochlorothiazide 12.5 MG tablet Commonly known as: HYDRODIURIL TAKE 1 TABLET(12.5 MG) BY MOUTH DAILY What changed:   how much to take  how to take this  when to take this  additional instructions   lidocaine-prilocaine cream Commonly known as: EMLA Apply 1 application topically as needed.   pantoprazole 40 MG tablet Commonly known as: PROTONIX Take 1 tablet (40 mg total) by mouth 2 (two) times daily. What changed: when  to take this   prochlorperazine 10 MG tablet Commonly known as: COMPAZINE Take 1 tablet (10 mg total) by mouth every 6 (six) hours as needed for nausea or vomiting.   sertraline 50 MG tablet Commonly known as: ZOLOFT Take 1 tablet (50 mg total) by mouth daily. What changed:   See the new instructions.  Another medication with the same name was removed. Continue taking this medication, and follow the directions you see here.   tamoxifen 20 MG tablet Commonly known as: NOLVADEX Take 1 tablet (20 mg total) by mouth daily.      Follow-up Information    Wilford Corner, MD Follow up.   Specialty: Gastroenterology Why: call to set up appt, GI doctor who did your endoscopy Contact information: [address] [place] [address] [phone]           Signed: Sol Blazing 12/08/2018, 2:44 PM

## 2018-12-08 NOTE — Progress Notes (Signed)
Pt discharge packet read to pt and son. Explained that pt need to follow up with Gastroenterology. Pt peripheral IV removed and IV team deacessed pt port. Pt vital signs were good upon discharge. Son is taking pt home.

## 2018-12-09 DIAGNOSIS — M1990 Unspecified osteoarthritis, unspecified site: Secondary | ICD-10-CM | POA: Diagnosis not present
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
## 2018-12-10 ENCOUNTER — Inpatient Hospital Stay: Payer: Medicaid Other

## 2018-12-10 ENCOUNTER — Encounter: Payer: Self-pay | Admitting: Hematology

## 2018-12-10 ENCOUNTER — Other Ambulatory Visit: Payer: Self-pay

## 2018-12-10 ENCOUNTER — Inpatient Hospital Stay (HOSPITAL_BASED_OUTPATIENT_CLINIC_OR_DEPARTMENT_OTHER): Payer: Medicaid Other | Admitting: Hematology

## 2018-12-10 ENCOUNTER — Telehealth: Payer: Self-pay | Admitting: Hematology

## 2018-12-10 VITALS — BP 140/73 | HR 60 | Temp 98.0°F | Resp 18 | Ht 65.0 in | Wt 265.5 lb

## 2018-12-10 DIAGNOSIS — Z5112 Encounter for antineoplastic immunotherapy: Secondary | ICD-10-CM | POA: Diagnosis not present

## 2018-12-10 DIAGNOSIS — E876 Hypokalemia: Secondary | ICD-10-CM | POA: Diagnosis not present

## 2018-12-10 DIAGNOSIS — I780 Hereditary hemorrhagic telangiectasia: Secondary | ICD-10-CM | POA: Diagnosis not present

## 2018-12-10 DIAGNOSIS — Z79899 Other long term (current) drug therapy: Secondary | ICD-10-CM | POA: Diagnosis not present

## 2018-12-10 DIAGNOSIS — Z95828 Presence of other vascular implants and grafts: Secondary | ICD-10-CM

## 2018-12-10 DIAGNOSIS — K922 Gastrointestinal hemorrhage, unspecified: Secondary | ICD-10-CM | POA: Diagnosis not present

## 2018-12-10 DIAGNOSIS — D500 Iron deficiency anemia secondary to blood loss (chronic): Secondary | ICD-10-CM | POA: Diagnosis not present

## 2018-12-10 DIAGNOSIS — E669 Obesity, unspecified: Secondary | ICD-10-CM | POA: Diagnosis not present

## 2018-12-10 DIAGNOSIS — I10 Essential (primary) hypertension: Secondary | ICD-10-CM | POA: Diagnosis not present

## 2018-12-10 DIAGNOSIS — F17210 Nicotine dependence, cigarettes, uncomplicated: Secondary | ICD-10-CM | POA: Diagnosis not present

## 2018-12-10 DIAGNOSIS — R040 Epistaxis: Secondary | ICD-10-CM | POA: Diagnosis not present

## 2018-12-10 DIAGNOSIS — I82621 Acute embolism and thrombosis of deep veins of right upper extremity: Secondary | ICD-10-CM

## 2018-12-10 DIAGNOSIS — M1990 Unspecified osteoarthritis, unspecified site: Secondary | ICD-10-CM | POA: Diagnosis not present

## 2018-12-10 LAB — CBC WITH DIFFERENTIAL (CANCER CENTER ONLY)
Abs Immature Granulocytes: 0.04 10*3/uL (ref 0.00–0.07)
Basophils Absolute: 0 10*3/uL (ref 0.0–0.1)
Basophils Relative: 0 %
Eosinophils Absolute: 0.1 10*3/uL (ref 0.0–0.5)
Eosinophils Relative: 1 %
HCT: 30.4 % — ABNORMAL LOW (ref 36.0–46.0)
Hemoglobin: 9.3 g/dL — ABNORMAL LOW (ref 12.0–15.0)
Immature Granulocytes: 0 %
Lymphocytes Relative: 12 %
Lymphs Abs: 1.3 10*3/uL (ref 0.7–4.0)
MCH: 27.2 pg (ref 26.0–34.0)
MCHC: 30.6 g/dL (ref 30.0–36.0)
MCV: 88.9 fL (ref 80.0–100.0)
Monocytes Absolute: 0.8 10*3/uL (ref 0.1–1.0)
Monocytes Relative: 7 %
Neutro Abs: 8.5 10*3/uL — ABNORMAL HIGH (ref 1.7–7.7)
Neutrophils Relative %: 80 %
Platelet Count: 270 10*3/uL (ref 150–400)
RBC: 3.42 MIL/uL — ABNORMAL LOW (ref 3.87–5.11)
RDW: 18.3 % — ABNORMAL HIGH (ref 11.5–15.5)
WBC Count: 10.7 10*3/uL — ABNORMAL HIGH (ref 4.0–10.5)
nRBC: 0 % (ref 0.0–0.2)

## 2018-12-10 LAB — CMP (CANCER CENTER ONLY)
ALT: 13 U/L (ref 0–44)
AST: 19 U/L (ref 15–41)
Albumin: 3.3 g/dL — ABNORMAL LOW (ref 3.5–5.0)
Alkaline Phosphatase: 55 U/L (ref 38–126)
Anion gap: 10 (ref 5–15)
BUN: 13 mg/dL (ref 6–20)
CO2: 29 mmol/L (ref 22–32)
Calcium: 8.2 mg/dL — ABNORMAL LOW (ref 8.9–10.3)
Chloride: 104 mmol/L (ref 98–111)
Creatinine: 1.2 mg/dL — ABNORMAL HIGH (ref 0.44–1.00)
GFR, Est AFR Am: 58 mL/min — ABNORMAL LOW (ref >60)
GFR, Estimated: 50 mL/min — ABNORMAL LOW (ref >60)
Glucose, Bld: 110 mg/dL — ABNORMAL HIGH (ref 70–99)
Potassium: 3 mmol/L — CL (ref 3.5–5.1)
Sodium: 143 mmol/L (ref 135–145)
Total Bilirubin: 0.6 mg/dL (ref 0.3–1.2)
Total Protein: 6.4 g/dL — ABNORMAL LOW (ref 6.5–8.1)

## 2018-12-10 LAB — RETICULOCYTES
Immature Retic Fract: 22.3 % — ABNORMAL HIGH (ref 2.3–15.9)
RBC.: 3.39 MIL/uL — ABNORMAL LOW (ref 3.87–5.11)
Retic Count, Absolute: 160.3 10*3/uL (ref 19.0–186.0)
Retic Ct Pct: 4.7 % — ABNORMAL HIGH (ref 0.4–3.1)

## 2018-12-10 LAB — IRON AND TIBC
Iron: 26 ug/dL — ABNORMAL LOW (ref 41–142)
Saturation Ratios: 10 % — ABNORMAL LOW (ref 21–57)
TIBC: 273 ug/dL (ref 236–444)
UIBC: 246 ug/dL (ref 120–384)

## 2018-12-10 LAB — FERRITIN: Ferritin: 180 ng/mL (ref 11–307)

## 2018-12-10 LAB — TOTAL PROTEIN, URINE DIPSTICK: Protein, ur: <30 mg/dL — AB

## 2018-12-10 MED ORDER — HEPARIN SOD (PORK) LOCK FLUSH 100 UNIT/ML IV SOLN
500.0000 [IU] | Freq: Once | INTRAVENOUS | Status: AC | PRN
  Administered 2018-12-10: 500 [IU]
  Filled 2018-12-10: qty 5

## 2018-12-10 MED ORDER — SODIUM CHLORIDE 0.9 % IV SOLN
5.3000 mg/kg | Freq: Once | INTRAVENOUS | Status: AC
  Administered 2018-12-10: 600 mg via INTRAVENOUS
  Filled 2018-12-10: qty 16

## 2018-12-10 MED ORDER — SODIUM CHLORIDE 0.9% FLUSH
10.0000 mL | Freq: Once | INTRAVENOUS | Status: AC
  Administered 2018-12-10: 10:00:00 10 mL
  Filled 2018-12-10: qty 10

## 2018-12-10 MED ORDER — SODIUM CHLORIDE 0.9 % IV SOLN
Freq: Once | INTRAVENOUS | Status: AC
  Administered 2018-12-10: 11:00:00 via INTRAVENOUS
  Filled 2018-12-10: qty 250

## 2018-12-10 MED ORDER — POTASSIUM CHLORIDE CRYS ER 20 MEQ PO TBCR
40.0000 meq | EXTENDED_RELEASE_TABLET | Freq: Once | ORAL | Status: AC
  Administered 2018-12-10: 11:00:00 40 meq via ORAL

## 2018-12-10 MED ORDER — SODIUM CHLORIDE 0.9% FLUSH
10.0000 mL | INTRAVENOUS | Status: DC | PRN
  Administered 2018-12-10: 13:00:00 10 mL
  Filled 2018-12-10: qty 10

## 2018-12-10 MED ORDER — POTASSIUM CHLORIDE 20 MEQ PO PACK: 20.0000 meq | PACK | Freq: Every day | ORAL | 0 refills | Status: DC

## 2018-12-10 MED ORDER — SODIUM CHLORIDE 0.9 % IV SOLN
125.0000 mg | Freq: Once | INTRAVENOUS | Status: AC
  Administered 2018-12-10: 125 mg via INTRAVENOUS
  Filled 2018-12-10: qty 10

## 2018-12-10 MED ORDER — POTASSIUM CHLORIDE CRYS ER 20 MEQ PO TBCR
EXTENDED_RELEASE_TABLET | ORAL | Status: AC
  Filled 2018-12-10: qty 2

## 2018-12-10 NOTE — Patient Instructions (Addendum)
[name] Discharge Instructions for Patients Receiving Chemotherapy  Today you received the following chemotherapy agents Avastin  To help prevent nausea and vomiting after your treatment, we encourage you to take your nausea medication as directed.  If you develop nausea and vomiting that is not controlled by your nausea medication, call the clinic.   BELOW ARE SYMPTOMS THAT SHOULD BE REPORTED IMMEDIATELY:  *FEVER GREATER THAN 100.5 F  *CHILLS WITH OR WITHOUT FEVER  NAUSEA AND VOMITING THAT IS NOT CONTROLLED WITH YOUR NAUSEA MEDICATION  *UNUSUAL SHORTNESS OF BREATH  *UNUSUAL BRUISING OR BLEEDING  TENDERNESS IN MOUTH AND THROAT WITH OR WITHOUT PRESENCE OF ULCERS  *URINARY PROBLEMS  *BOWEL PROBLEMS  UNUSUAL RASH Items with * indicate a potential emergency and should be followed up as soon as possible.  Feel free to call the clinic should you have any questions or concerns. The clinic phone number is [phone].  Please show the [facility] at check-in to the Emergency Department and triage nurse.  Sodium Ferric Gluconate Complex injection What is this medicine? SODIUM FERRIC GLUCONATE COMPLEX (SOE dee um FER ik GLOO koe nate KOM pleks) is an iron replacement. It is used with epoetin therapy to treat low iron levels in patients who are receiving hemodialysis. This medicine may be used for other purposes; ask your health care provider or pharmacist if you have questions. COMMON BRAND NAME(S): Ferrlecit, Nulecit What should I tell my health care provider before I take this medicine? They need to know if you have any of the following conditions:  anemia that is not from iron deficiency  high levels of iron in the body  an unusual or allergic reaction to iron, benzyl alcohol, other medicines, foods, dyes, or preservatives  pregnant or are trying to become pregnant  breast-feeding How should I use this medicine? This medicine is for infusion into  a vein. It is given by a health care professional in a hospital or clinic setting. Talk to your pediatrician regarding the use of this medicine in children. While this drug may be prescribed for children as young as 16 years old for selected conditions, precautions do apply. Overdosage: If you think you have taken too much of this medicine contact a poison control center or emergency room at once. NOTE: This medicine is only for you. Do not share this medicine with others. What if I miss a dose? It is important not to miss your dose. Call your doctor or health care professional if you are unable to keep an appointment. What may interact with this medicine? Do not take this medicine with any of the following medications:  deferoxamine  dimercaprol  other iron products This medicine may also interact with the following medications:  chloramphenicol  deferasirox  medicine for blood pressure like enalapril This list may not describe all possible interactions. Give your health care provider a list of all the medicines, herbs, non-prescription drugs, or dietary supplements you use. Also tell them if you smoke, drink alcohol, or use illegal drugs. Some items may interact with your medicine. What should I watch for while using this medicine? Your condition will be monitored carefully while you are receiving this medicine. Visit your doctor for check-ups as directed. What side effects may I notice from receiving this medicine? Side effects that you should report to your doctor or health care professional as soon as possible:  allergic reactions like skin rash, itching or hives, swelling of the face, lips, or tongue  breathing problems  changes in hearing  changes in vision  chills, flushing, or sweating  fast, irregular heartbeat  feeling faint or lightheaded, falls  fever, flu-like symptoms  high or low blood pressure  pain, tingling, numbness in the hands or feet  severe pain  in the chest, back, flanks, or groin  swelling of the ankles, feet, hands  trouble passing urine or change in the amount of urine  unusually weak or tired Side effects that usually do not require medical attention (report to your doctor or health care professional if they continue or are bothersome):  cramps  dark colored stools  diarrhea  headache  nausea, vomiting  stomach upset This list may not describe all possible side effects. Call your doctor for medical advice about side effects. You may report side effects to FDA at 1-800-FDA-1088. Where should I keep my medicine? This drug is given in a hospital or clinic and will not be stored at home. NOTE: This sheet is a summary. It may not cover all possible information. If you have questions about this medicine, talk to your doctor, pharmacist, or health care provider.  2020 Elsevier/Gold Standard (2007-12-02 15:58:57)

## 2018-12-10 NOTE — Telephone Encounter (Signed)
Scheduled appt per 8/27 los.  Patient will a get a print out at her next scheduled appts

## 2018-12-11 DIAGNOSIS — M1990 Unspecified osteoarthritis, unspecified site: Secondary | ICD-10-CM | POA: Diagnosis not present

## 2018-12-14 DIAGNOSIS — M1990 Unspecified osteoarthritis, unspecified site: Secondary | ICD-10-CM | POA: Diagnosis not present

## 2018-12-15 DIAGNOSIS — M1990 Unspecified osteoarthritis, unspecified site: Secondary | ICD-10-CM | POA: Diagnosis not present

## 2018-12-16 DIAGNOSIS — M1990 Unspecified osteoarthritis, unspecified site: Secondary | ICD-10-CM | POA: Diagnosis not present

## 2018-12-17 ENCOUNTER — Inpatient Hospital Stay: Payer: Medicaid Other

## 2018-12-17 ENCOUNTER — Other Ambulatory Visit: Payer: Self-pay

## 2018-12-17 DIAGNOSIS — M1990 Unspecified osteoarthritis, unspecified site: Secondary | ICD-10-CM | POA: Diagnosis not present

## 2018-12-17 DIAGNOSIS — D500 Iron deficiency anemia secondary to blood loss (chronic): Secondary | ICD-10-CM

## 2018-12-18 DIAGNOSIS — M1990 Unspecified osteoarthritis, unspecified site: Secondary | ICD-10-CM | POA: Diagnosis not present

## 2018-12-21 DIAGNOSIS — M1990 Unspecified osteoarthritis, unspecified site: Secondary | ICD-10-CM | POA: Diagnosis not present

## 2018-12-22 DIAGNOSIS — M1990 Unspecified osteoarthritis, unspecified site: Secondary | ICD-10-CM | POA: Diagnosis not present

## 2018-12-23 DIAGNOSIS — M1990 Unspecified osteoarthritis, unspecified site: Secondary | ICD-10-CM | POA: Diagnosis not present

## 2018-12-24 DIAGNOSIS — M1990 Unspecified osteoarthritis, unspecified site: Secondary | ICD-10-CM | POA: Diagnosis not present

## 2018-12-25 ENCOUNTER — Telehealth: Payer: Self-pay | Admitting: Hematology

## 2018-12-25 ENCOUNTER — Inpatient Hospital Stay: Payer: Medicaid Other

## 2018-12-25 ENCOUNTER — Inpatient Hospital Stay: Payer: Medicaid Other | Attending: Hematology

## 2018-12-25 ENCOUNTER — Other Ambulatory Visit: Payer: Self-pay | Admitting: Hematology

## 2018-12-25 ENCOUNTER — Telehealth: Payer: Self-pay

## 2018-12-25 DIAGNOSIS — D500 Iron deficiency anemia secondary to blood loss (chronic): Secondary | ICD-10-CM | POA: Insufficient documentation

## 2018-12-25 DIAGNOSIS — R040 Epistaxis: Secondary | ICD-10-CM | POA: Insufficient documentation

## 2018-12-25 DIAGNOSIS — Z79899 Other long term (current) drug therapy: Secondary | ICD-10-CM | POA: Insufficient documentation

## 2018-12-25 DIAGNOSIS — Z5112 Encounter for antineoplastic immunotherapy: Secondary | ICD-10-CM | POA: Insufficient documentation

## 2018-12-25 DIAGNOSIS — F411 Generalized anxiety disorder: Secondary | ICD-10-CM

## 2018-12-25 DIAGNOSIS — E876 Hypokalemia: Secondary | ICD-10-CM | POA: Insufficient documentation

## 2018-12-25 DIAGNOSIS — D72828 Other elevated white blood cell count: Secondary | ICD-10-CM | POA: Insufficient documentation

## 2018-12-25 DIAGNOSIS — Z86718 Personal history of other venous thrombosis and embolism: Secondary | ICD-10-CM | POA: Insufficient documentation

## 2018-12-25 DIAGNOSIS — I10 Essential (primary) hypertension: Secondary | ICD-10-CM | POA: Insufficient documentation

## 2018-12-25 DIAGNOSIS — I780 Hereditary hemorrhagic telangiectasia: Secondary | ICD-10-CM | POA: Insufficient documentation

## 2018-12-25 DIAGNOSIS — M1990 Unspecified osteoarthritis, unspecified site: Secondary | ICD-10-CM | POA: Diagnosis not present

## 2018-12-25 DIAGNOSIS — F17210 Nicotine dependence, cigarettes, uncomplicated: Secondary | ICD-10-CM | POA: Insufficient documentation

## 2018-12-25 NOTE — Telephone Encounter (Signed)
Cancelled 09/11 appointment per 09/11 schedule message, returned patient's phone call regarding rescheduling 09/11 appointment and ended up leaving a voicemail.

## 2018-12-25 NOTE — Telephone Encounter (Signed)
Patient calls stating that she is not coming for her infusion today because her throat is a little sore, mild cough, no fever, wants to reschedule.  I let infusion RN know and sent a scheduling message.

## 2018-12-28 DIAGNOSIS — M1990 Unspecified osteoarthritis, unspecified site: Secondary | ICD-10-CM | POA: Diagnosis not present

## 2018-12-28 NOTE — Progress Notes (Signed)
[place]   [phone] [phone]   Clinic Follow up Note   Patient Care Team: [name], MD as PCP - General (Internal Medicine)  Date of Service:  12/31/2018  CHIEF COMPLAINT: F/uof HHT andAnemia   PREVIOUS THERAPY:  -Multiple EGD, small bowel enteroscope, C-scope with APC -Oral and iv amicar were given during hospital stay, no effect -IR embolization ofof the left gastric and short gastric artery branch vessels without evidence of residual flow in the Dieulafoy lesionon 12/10/17 -Avastin on 8/2 and 8/30 at [facility]; starting 12/26/17 continue 5mg /kg q2 weeksuntil 02/20/18, restarted on 04/03/2018   CURRENT THERAPY: -Tamoxifen started in 10/2017 for HHT stopped 12/10/18 due to DVT -Blood transfusion for severe anemia secondary to GI bleedingas needed -ivferric gluconate every1-2 weeks as needed with ferritin goal 100-200 -RestartedAvastin q2weeks on 04/03/18 -Amicar 1g bidstarting4/06/2018. Reduced to only as needed on 12/10/18 due to DVT  INTERVAL HISTORY:  LACREE House is here for a follow up of HTT and anemia. She presents to the clinic alone. She notes last week she had cold chills, but not sure if she had a fever. She had cough, diarrhea and hoarse throat. She overall feels better but still has cough, hoarse throat. She notes this morning her nose bleed significantly. She put pressure on it. She notes her stool is normal color but runny due to diarrhea. She notes heartburn/acid reflex after anything she eats lately.     REVIEW OF SYSTEMS:   Constitutional: Denies fevers, chills or abnormal weight loss Eyes: Denies blurriness of vision Ears, nose, mouth, throat, and face: Denies mucositis (+) Hoarse voice (+) 1 episode Epistaxis Respiratory: Denies dyspnea or wheezes (+) Cough  Cardiovascular: Denies palpitation, chest discomfort or lower extremity swelling Gastrointestinal:  Denies nausea, (+) Loose runny stool (+)  heartburn and acid reflex Skin: Denies abnormal skin rashes Lymphatics: Denies new lymphadenopathy or easy bruising Neurological:Denies numbness, tingling or new weaknesses Behavioral/Psych: Mood is stable, no new changes  All other systems were reviewed with the patient and are negative.  MEDICAL HISTORY:  Past Medical History:  Diagnosis Date  . Anxiety   . Arthritis    knnes,back  . GERD (gastroesophageal reflux disease)   . 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 History of swelling of feet   . Hyperlipidemia   . Hypertension   . Major depressive disorder, recurrent episode ([place]) 06/05/2015  . Obesity   . Snores     SURGICAL HISTORY: Past Surgical History:  Procedure Laterality Date  . ABDOMINAL HYSTERECTOMY    . CARPAL TUNNEL RELEASE  05/13/2011   Procedure: CARPAL TUNNEL RELEASE;  Surgeon: Nita Sells, MD;  Location: [place];  Service: Orthopedics;  Laterality: Left;  . COLONOSCOPY WITH PROPOFOL N/A 04/28/2014   Procedure: COLONOSCOPY WITH PROPOFOL;  Surgeon: Cleotis Nipper, MD;  Location: [facility] ENDOSCOPY;  Service: Endoscopy;  Laterality: N/A;  . DG TOES*L*  2/10   rt  . DILATION AND CURETTAGE OF UTERUS    . ENTEROSCOPY N/A 10/17/2017   Procedure: ENTEROSCOPY;  Surgeon: Otis Brace, MD;  Location: [place] ENDOSCOPY;  Service: Gastroenterology;  Laterality: N/A;  . ESOPHAGOGASTRODUODENOSCOPY N/A 04/10/2014   Procedure: ESOPHAGOGASTRODUODENOSCOPY (EGD);  Surgeon: [name], MD;  Location: [facility] ENDOSCOPY;  Service: Endoscopy;  Laterality: N/A;  . ESOPHAGOGASTRODUODENOSCOPY N/A 05/10/2017   Procedure: ESOPHAGOGASTRODUODENOSCOPY (EGD);  Surgeon: Ronald Lobo, MD;  Location: [facility] ENDOSCOPY;  Service: Endoscopy;  Laterality: N/A;  . ESOPHAGOGASTRODUODENOSCOPY N/A 09/22/2017   Procedure:  ESOPHAGOGASTRODUODENOSCOPY (EGD);  Surgeon: Clarene Essex, MD;  Location: [place];  Service: Endoscopy;  Laterality: N/A;  bedside  .  ESOPHAGOGASTRODUODENOSCOPY (EGD) WITH PROPOFOL N/A 04/27/2014   Procedure: ESOPHAGOGASTRODUODENOSCOPY (EGD) WITH PROPOFOL;  Surgeon: Cleotis Nipper, MD;  Location: [place];  Service: Endoscopy;  Laterality: N/A;  possible apc  . ESOPHAGOGASTRODUODENOSCOPY (EGD) WITH PROPOFOL N/A 09/30/2017   Procedure: ESOPHAGOGASTRODUODENOSCOPY (EGD) WITH PROPOFOL;  Surgeon: [name], MD;  Location: [place];  Service: Gastroenterology;  Laterality: N/A;  . ESOPHAGOGASTRODUODENOSCOPY (EGD) WITH PROPOFOL N/A 10/01/2017   Procedure: ESOPHAGOGASTRODUODENOSCOPY (EGD) WITH PROPOFOL;  Surgeon: [name], MD;  Location: [place];  Service: Gastroenterology;  Laterality: N/A;  . ESOPHAGOGASTRODUODENOSCOPY (EGD) WITH PROPOFOL N/A 10/08/2017   Procedure: ESOPHAGOGASTRODUODENOSCOPY (EGD) WITH PROPOFOL;  Surgeon: Otis Brace, MD;  Location: [facility] ENDOSCOPY;  Service: Gastroenterology;  Laterality: N/A;  . ESOPHAGOGASTRODUODENOSCOPY (EGD) WITH PROPOFOL N/A 10/17/2017   Procedure: ESOPHAGOGASTRODUODENOSCOPY (EGD) WITH PROPOFOL;  Surgeon: Otis Brace, MD;  Location: [facility] ENDOSCOPY;  Service: Gastroenterology;  Laterality: N/A;  . ESOPHAGOGASTRODUODENOSCOPY (EGD) WITH PROPOFOL N/A 10/19/2017   Procedure: ESOPHAGOGASTRODUODENOSCOPY (EGD) WITH PROPOFOL;  Surgeon: Otis Brace, MD;  Location: [facility] ENDOSCOPY;  Service: Gastroenterology;  Laterality: N/A;  . ESOPHAGOGASTRODUODENOSCOPY (EGD) WITH PROPOFOL N/A 12/04/2018   Procedure: ESOPHAGOGASTRODUODENOSCOPY (EGD) WITH PROPOFOL;  Surgeon: Wilford Corner, MD;  Location: [facility] ENDOSCOPY;  Service: Endoscopy;  Laterality: N/A;  . GIVENS CAPSULE STUDY N/A 10/02/2017   Procedure: GIVENS CAPSULE STUDY;  Surgeon: [name], MD;  Location: [place];  Service: Gastroenterology;  Laterality: N/A;  . GIVENS CAPSULE STUDY N/A 10/08/2017   Procedure: GIVENS CAPSULE STUDY;  Surgeon: Otis Brace, MD;  Location: [place];  Service: Gastroenterology;  Laterality: N/A;   endoscopic placement of capsule  . HEMOSTASIS CLIP PLACEMENT  12/04/2018   Procedure: HEMOSTASIS CLIP PLACEMENT;  Surgeon: Wilford Corner, MD;  Location: [facility] ENDOSCOPY;  Service: Endoscopy;;  . HOT HEMOSTASIS N/A 04/27/2014   Procedure: HOT HEMOSTASIS (ARGON PLASMA COAGULATION/BICAP);  Surgeon: Cleotis Nipper, MD;  Location: [facility] ENDOSCOPY;  Service: Endoscopy;  Laterality: N/A;  . HOT HEMOSTASIS N/A 09/30/2017   Procedure: HOT HEMOSTASIS (ARGON PLASMA COAGULATION/BICAP);  Surgeon: [name], MD;  Location: [place];  Service: Gastroenterology;  Laterality: N/A;  . HOT HEMOSTASIS N/A 10/01/2017   Procedure: HOT HEMOSTASIS (ARGON PLASMA COAGULATION/BICAP);  Surgeon: [name], MD;  Location: [place];  Service: Gastroenterology;  Laterality: N/A;  . HOT HEMOSTASIS N/A 10/17/2017   Procedure: HOT HEMOSTASIS (ARGON PLASMA COAGULATION/BICAP);  Surgeon: Otis Brace, MD;  Location: [facility] ENDOSCOPY;  Service: Gastroenterology;  Laterality: N/A;  . HOT HEMOSTASIS N/A 10/19/2017   Procedure: HOT HEMOSTASIS (ARGON PLASMA COAGULATION/BICAP);  Surgeon: Otis Brace, MD;  Location: [facility] ENDOSCOPY;  Service: Gastroenterology;  Laterality: N/A;  . IR IMAGING GUIDED PORT INSERTION  07/08/2018  . L shoulder Surgery  2011  . SUBMUCOSAL INJECTION  09/22/2017   Procedure: SUBMUCOSAL INJECTION;  Surgeon: Clarene Essex, MD;  Location: [facility] ENDOSCOPY;  Service: Endoscopy;;  . SUBMUCOSAL INJECTION  12/04/2018   Procedure: SUBMUCOSAL INJECTION;  Surgeon: Wilford Corner, MD;  Location: [facility] ENDOSCOPY;  Service: Endoscopy;;    I have reviewed the social history and family history with the patient and they are unchanged from previous note.  ALLERGIES:  is allergic to feraheme [ferumoxytol]; nsaids; tomato; and wasp venom.  MEDICATIONS:  Current Outpatient Medications  Medication Sig Dispense Refill  . albuterol (PROVENTIL HFA;VENTOLIN HFA) 108 (90 Base) MCG/ACT inhaler Inhale 1-2 puffs into the lungs every 6  (six) hours as needed for  wheezing or shortness of breath. 1 Inhaler 3  . ALPRAZolam (XANAX) 0.5 MG tablet TAKE 1 TABLET BY MOUTH 2 TIMES A DAY AS NEEDED FOR ANXIETY (Patient taking differently: Take 0.5 mg by mouth 2 (two) times daily as needed for anxiety. ) 60 tablet 0  . Aminocaproic Acid (AMICAR) 1000 MG TABS Take 1 tablet (1,000 mg total) by mouth 2 (two) times daily. 60 each 3  . amLODipine (NORVASC) 10 MG tablet TAKE 1 TABLET(10 MG) BY MOUTH DAILY 90 tablet 0  . atorvastatin (LIPITOR) 80 MG tablet TAKE 1 TABLET(80 MG) BY MOUTH DAILY (Patient taking differently: Take 80 mg by mouth daily. ) 30 tablet 3  . diphenhydramine-acetaminophen (TYLENOL PM) 25-500 MG TABS tablet Take 1 tablet by mouth at bedtime as needed (sleep).    [name] guaifenesin (ROBITUSSIN) 100 MG/5ML syrup Take 200 mg by mouth 3 (three) times daily as needed for cough or congestion.    . hydrochlorothiazide (HYDRODIURIL) 12.5 MG tablet TAKE 1 TABLET(12.5 MG) BY MOUTH DAILY (Patient taking differently: Take 12.5 mg by mouth daily. ) 90 tablet 0  . lidocaine-prilocaine (EMLA) cream Apply 1 application topically as needed. 30 g 0  . pantoprazole (PROTONIX) 40 MG tablet Take 1 tablet (40 mg total) by mouth 2 (two) times daily. 60 tablet 5  . potassium chloride (KLOR-CON) 20 MEQ packet Take 20 mEq by mouth daily. 15 packet 0  . prochlorperazine (COMPAZINE) 10 MG tablet Take 1 tablet (10 mg total) by mouth every 6 (six) hours as needed for nausea or vomiting. 30 tablet 1  . sertraline (ZOLOFT) 50 MG tablet Take 1 tablet (50 mg total) by mouth daily. 30 tablet 2   No current facility-administered medications for this visit.    Facility-Administered Medications Ordered in Other Visits  Medication Dose Route Frequency Provider Last Rate Last Dose  . 0.9 %  sodium chloride infusion   Intravenous Once Truitt Merle, MD        PHYSICAL EXAMINATION: ECOG PERFORMANCE STATUS: 1 - Symptomatic but completely ambulatory  Vitals:   12/31/18 1356  12/31/18 1357  BP: (!) 167/89 (!) 154/78  Pulse: 85   Resp: 19   Temp: 97.8 F (36.6 C)   SpO2: 98%    Filed Weights   12/31/18 1356  Weight: 276 lb 11.2 oz (125.5 kg)    GENERAL:alert, no distress and comfortable SKIN: skin color, texture, turgor are normal, no rashes or significant lesions EYES: normal, Conjunctiva are pink and non-injected, sclera clear  NECK: supple, thyroid normal size, non-tender, without nodularity LYMPH:  no palpable lymphadenopathy in the cervical, axillary  LUNGS: clear to auscultation and percussion with normal breathing effort HEART: regular rate & rhythm and no murmurs and no lower extremity edema ABDOMEN:abdomen soft, non-tender and normal bowel sounds Musculoskeletal:no cyanosis of digits and no clubbing  NEURO: alert & oriented x 3 with fluent speech, no focal motor/sensory deficits  LABORATORY DATA:  I have reviewed the data as listed CBC Latest Ref Rng & Units 12/31/2018 12/10/2018 12/08/2018  WBC 4.0 - 10.5 K/uL 7.4 10.7(H) 13.3(H)  Hemoglobin 12.0 - 15.0 g/dL 9.3(L) 9.3(L) 10.1(L)  Hematocrit 36.0 - 46.0 % 30.9(L) 30.4(L) 32.8(L)  Platelets 150 - 400 K/uL 206 270 242     CMP Latest Ref Rng & Units 12/31/2018 12/10/2018 12/07/2018  Glucose 70 - 99 mg/dL 140(H) 110(H) 114(H)  BUN 6 - 20 mg/dL 8 13 8   Creatinine 0.44 - 1.00 mg/dL 0.98 1.20(H) 0.76  Sodium 135 - 145 mmol/L 142  143 139  Potassium 3.5 - 5.1 mmol/L 3.2(L) 3.0(LL) 3.4(L)  Chloride 98 - 111 mmol/L 103 104 105  CO2 22 - 32 mmol/L 29 29 22   Calcium 8.9 - 10.3 mg/dL 8.5(L) 8.2(L) 9.0  Total Protein 6.5 - 8.1 g/dL 6.6 6.4(L) -  Total Bilirubin 0.3 - 1.2 mg/dL 0.3 0.6 -  Alkaline Phos 38 - 126 U/L 71 55 -  AST 15 - 41 U/L 15 19 -  ALT 0 - 44 U/L 6 13 -      RADIOGRAPHIC STUDIES: I have personally reviewed the radiological images as listed and agreed with the findings in the report. No results found.   ASSESSMENT & PLAN:  YARESLI DOBIAS is a 58 y.o. female with   1.  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 Extensive workup found the pt to have HHT based on her personal and familyhistoryand recurrentAVM GI bleedings. -Sheis s/pIR embolization of the left Gastric and short gastric artery branchvesselswithout evidence of residual flow in the Dieulafoy lesionon 12/10/2017. -[facility]hereceivedAv[facility] and then every 2 weeks9/13/19-11/8/19 -She is currently onTamoxifenand Amicarfor HHT  -She hashad recurrent GI bleeding and severe anemia in 11/2018. She required hospitalization with several blood transfusions, IV iron and IV amicar and cauterization of stomach bleeding. She responded well.  -Given her recent right arm DVT she has stop Tamoxifen and reduce use of Amicar to as needed when she bleeds since 12/10/18. Her primary treatment will now be Avastin and IV Iron.   -Labs reviewed, CBC and CMP except Hg 9.3, K 3.2. Retic ct normal, Iron panel still pending. Will proceed with Avastin and ferric gluconate today -Continue IVferric gluconateevery 1-2 weekunlessferritin >100, Avastin every 2 weeks.    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 -Shepreviouslynotedshe is no longer takingOral Fergon 1 tab TID -She developed another episode of severe anemia and required hospitalization and blood transfusion last week, IV Amicar and IV iron.  -EGD from 12/04/18 showeda dieulafoy lesion which was clipped and injected, large amount blood found  3. Reactive Leukocytosis and reactive thrombocytosis  -She has mild leukocytosis with dominant neutrophils, likely reactive -Also has intermittent to  moderate thrombocytosis, likely reactive to iron deficiency -continue monitoring -PLT normal andWBCnormaltoday (12/31/18)  4. 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 -I recommend she continue to follow up with her GI, Dr. Cristina Gong -She denies blood in stool this week. -Due to increasedEpistaxis, IpreviouslystartedheronAmicar1 g twice dailyon4/3/20  -She had severe GI bleeding which required hospitalization in 11/2018 with several blood transfusions and EGD to cauterize her stomach bleeding. Given she also had blood clot will reduce use of Amicar to only as needed for significant epistaxis (12/10/18) -Her GI bleeding has resolved currently. She had 1 episode of significant epistaxis this morning (12/31/18)   5. Right Arm DVT -She had right arm DVT that extended to her right clavicle on 12/07/2018 when she was hospitalized for GI bleeding. It is possibly related to Amicar, tamoxifen and port.no anticoagulation due to her severe GI bleeding. Her swelling has resolved. But still has mild tingling and numbness.  -She is not a candidate for anticoagulation  due to severe GI bleeding -I discussed Tamoxifen does increase her risk for blood clots so we will stop it.  -She will hold Amicar unless she has extensive epistaxis. Will only use as needed, no longer daily.  -Will monitor her PAC and keep if flushed regularly.  -She recently had right hand nodule. Swelling has resolved. Will obtain right arm [place] for evaluation   6. Smoking cessation -She was smoking 10 cigarettes a day on average before recent hospitalization -I strongly encouraged her to stop smoking completely, she will try.  7. Hypokalemia -Potassium 3.0 today, will give oral potassium chloride 40 mEq IV infusion, and I called in potassium chloride 20 mEq daily for 2 weeks -will monitor   8. URI -she has had fatigue, mild  cough, sore throat for the past week. Has improved this week. Afebrile  -Will monitor. She will continue Robitussin    PLAN: -Right arm [place] in 1-2 weeks  -Proceed with Avastin and Ferric Gluconate today  -We will continue lab every 1-2 weeks (if Hg<9.0, will add lab next week) -IV Avastin every 2 weeks, ferric gluconate every 1 to 2 weeks based on her results (hold if ferritin >200 on last lab)   No problem-specific Assessment & Plan notes found for this encounter.   No orders of the defined types were placed in this encounter.  All questions were answered. The patient knows to call the clinic with any problems, questions or concerns. No barriers to learning was detected. I spent 15 minutes counseling the patient face to face. The total time spent in the appointment was 20 minutes and more than 50% was on counseling and review of test results     Truitt Merle, MD 12/31/2018   I, Joslyn Devon, am acting as scribe for Truitt Merle, MD.   I have reviewed the above documentation for accuracy and completeness, and I agree with the above.

## 2018-12-29 ENCOUNTER — Other Ambulatory Visit: Payer: Self-pay | Admitting: Nurse Practitioner

## 2018-12-29 ENCOUNTER — Ambulatory Visit (HOSPITAL_COMMUNITY): Payer: Medicaid Other | Attending: Hematology

## 2018-12-29 DIAGNOSIS — M1990 Unspecified osteoarthritis, unspecified site: Secondary | ICD-10-CM | POA: Diagnosis not present

## 2018-12-29 DIAGNOSIS — F411 Generalized anxiety disorder: Secondary | ICD-10-CM

## 2018-12-29 MED ORDER — SERTRALINE HCL 50 MG PO TABS: 50.0000 mg | ORAL_TABLET | Freq: Every day | ORAL | 2 refills | Status: DC

## 2018-12-29 NOTE — Telephone Encounter (Signed)
Refill request

## 2018-12-30 DIAGNOSIS — M1990 Unspecified osteoarthritis, unspecified site: Secondary | ICD-10-CM | POA: Diagnosis not present

## 2018-12-31 ENCOUNTER — Encounter: Payer: Self-pay | Admitting: Hematology

## 2018-12-31 ENCOUNTER — Other Ambulatory Visit: Payer: Self-pay

## 2018-12-31 ENCOUNTER — Inpatient Hospital Stay: Payer: Medicaid Other

## 2018-12-31 ENCOUNTER — Inpatient Hospital Stay (HOSPITAL_BASED_OUTPATIENT_CLINIC_OR_DEPARTMENT_OTHER): Payer: Medicaid Other | Admitting: Hematology

## 2018-12-31 ENCOUNTER — Telehealth: Payer: Self-pay | Admitting: Hematology

## 2018-12-31 VITALS — BP 143/72 | HR 71 | Temp 99.6°F | Resp 20

## 2018-12-31 VITALS — BP 154/78 | HR 85 | Temp 97.8°F | Resp 19 | Ht 65.0 in | Wt 276.7 lb

## 2018-12-31 DIAGNOSIS — Z79899 Other long term (current) drug therapy: Secondary | ICD-10-CM | POA: Diagnosis not present

## 2018-12-31 DIAGNOSIS — D500 Iron deficiency anemia secondary to blood loss (chronic): Secondary | ICD-10-CM

## 2018-12-31 DIAGNOSIS — M1990 Unspecified osteoarthritis, unspecified site: Secondary | ICD-10-CM | POA: Diagnosis not present

## 2018-12-31 DIAGNOSIS — Z86718 Personal history of other venous thrombosis and embolism: Secondary | ICD-10-CM | POA: Diagnosis not present

## 2018-12-31 DIAGNOSIS — I780 Hereditary hemorrhagic telangiectasia: Secondary | ICD-10-CM

## 2018-12-31 DIAGNOSIS — F17210 Nicotine dependence, cigarettes, uncomplicated: Secondary | ICD-10-CM | POA: Diagnosis not present

## 2018-12-31 DIAGNOSIS — Z5112 Encounter for antineoplastic immunotherapy: Secondary | ICD-10-CM | POA: Diagnosis not present

## 2018-12-31 DIAGNOSIS — Z95828 Presence of other vascular implants and grafts: Secondary | ICD-10-CM

## 2018-12-31 DIAGNOSIS — R040 Epistaxis: Secondary | ICD-10-CM | POA: Diagnosis not present

## 2018-12-31 DIAGNOSIS — E876 Hypokalemia: Secondary | ICD-10-CM | POA: Diagnosis not present

## 2018-12-31 DIAGNOSIS — D72828 Other elevated white blood cell count: Secondary | ICD-10-CM | POA: Diagnosis not present

## 2018-12-31 DIAGNOSIS — I10 Essential (primary) hypertension: Secondary | ICD-10-CM | POA: Diagnosis not present

## 2018-12-31 LAB — CBC WITH DIFFERENTIAL (CANCER CENTER ONLY)
Abs Immature Granulocytes: 0.02 10*3/uL (ref 0.00–0.07)
Basophils Absolute: 0 10*3/uL (ref 0.0–0.1)
Basophils Relative: 0 %
Eosinophils Absolute: 0.1 10*3/uL (ref 0.0–0.5)
Eosinophils Relative: 1 %
HCT: 30.9 % — ABNORMAL LOW (ref 36.0–46.0)
Hemoglobin: 9.3 g/dL — ABNORMAL LOW (ref 12.0–15.0)
Immature Granulocytes: 0 %
Lymphocytes Relative: 14 %
Lymphs Abs: 1 10*3/uL (ref 0.7–4.0)
MCH: 26.2 pg (ref 26.0–34.0)
MCHC: 30.1 g/dL (ref 30.0–36.0)
MCV: 87 fL (ref 80.0–100.0)
Monocytes Absolute: 0.3 10*3/uL (ref 0.1–1.0)
Monocytes Relative: 4 %
Neutro Abs: 6 10*3/uL (ref 1.7–7.7)
Neutrophils Relative %: 81 %
Platelet Count: 206 10*3/uL (ref 150–400)
RBC: 3.55 MIL/uL — ABNORMAL LOW (ref 3.87–5.11)
RDW: 17.7 % — ABNORMAL HIGH (ref 11.5–15.5)
WBC Count: 7.4 10*3/uL (ref 4.0–10.5)
nRBC: 0 % (ref 0.0–0.2)

## 2018-12-31 LAB — CMP (CANCER CENTER ONLY)
ALT: 6 U/L (ref 0–44)
AST: 15 U/L (ref 15–41)
Albumin: 3.4 g/dL — ABNORMAL LOW (ref 3.5–5.0)
Alkaline Phosphatase: 71 U/L (ref 38–126)
Anion gap: 10 (ref 5–15)
BUN: 8 mg/dL (ref 6–20)
CO2: 29 mmol/L (ref 22–32)
Calcium: 8.5 mg/dL — ABNORMAL LOW (ref 8.9–10.3)
Chloride: 103 mmol/L (ref 98–111)
Creatinine: 0.98 mg/dL (ref 0.44–1.00)
GFR, Est AFR Am: >60 mL/min (ref >60)
GFR, Estimated: >60 mL/min (ref >60)
Glucose, Bld: 140 mg/dL — ABNORMAL HIGH (ref 70–99)
Potassium: 3.2 mmol/L — ABNORMAL LOW (ref 3.5–5.1)
Sodium: 142 mmol/L (ref 135–145)
Total Bilirubin: 0.3 mg/dL (ref 0.3–1.2)
Total Protein: 6.6 g/dL (ref 6.5–8.1)

## 2018-12-31 LAB — IRON AND TIBC
Iron: 32 ug/dL — ABNORMAL LOW (ref 41–142)
Saturation Ratios: 12 % — ABNORMAL LOW (ref 21–57)
TIBC: 266 ug/dL (ref 236–444)
UIBC: 234 ug/dL (ref 120–384)

## 2018-12-31 LAB — RETICULOCYTES
Immature Retic Fract: 25.2 % — ABNORMAL HIGH (ref 2.3–15.9)
RBC.: 3.55 MIL/uL — ABNORMAL LOW (ref 3.87–5.11)
Retic Count, Absolute: 63.9 10*3/uL (ref 19.0–186.0)
Retic Ct Pct: 1.8 % (ref 0.4–3.1)

## 2018-12-31 LAB — FERRITIN: Ferritin: 58 ng/mL (ref 11–307)

## 2018-12-31 LAB — SAMPLE TO BLOOD BANK

## 2018-12-31 LAB — TOTAL PROTEIN, URINE DIPSTICK: Protein, ur: 30 mg/dL — AB

## 2018-12-31 MED ORDER — HEPARIN SOD (PORK) LOCK FLUSH 100 UNIT/ML IV SOLN
500.0000 [IU] | Freq: Once | INTRAVENOUS | Status: AC | PRN
  Administered 2018-12-31: 500 [IU]
  Filled 2018-12-31: qty 5

## 2018-12-31 MED ORDER — HEPARIN SOD (PORK) LOCK FLUSH 100 UNIT/ML IV SOLN
500.0000 [IU] | Freq: Once | INTRAVENOUS | Status: AC
  Administered 2018-12-31: 500 [IU]
  Filled 2018-12-31: qty 5

## 2018-12-31 MED ORDER — SODIUM CHLORIDE 0.9 % IV SOLN
Freq: Once | INTRAVENOUS | Status: AC
  Administered 2018-12-31: 15:00:00 via INTRAVENOUS
  Filled 2018-12-31: qty 250

## 2018-12-31 MED ORDER — SODIUM CHLORIDE 0.9 % IV SOLN
5.3000 mg/kg | Freq: Once | INTRAVENOUS | Status: AC
  Administered 2018-12-31: 600 mg via INTRAVENOUS
  Filled 2018-12-31: qty 16

## 2018-12-31 MED ORDER — SODIUM CHLORIDE 0.9% FLUSH
10.0000 mL | INTRAVENOUS | Status: DC | PRN
  Administered 2018-12-31: 10 mL
  Filled 2018-12-31: qty 10

## 2018-12-31 MED ORDER — SODIUM CHLORIDE 0.9% FLUSH
10.0000 mL | Freq: Once | INTRAVENOUS | Status: AC
  Administered 2018-12-31: 10 mL
  Filled 2018-12-31: qty 10

## 2018-12-31 MED ORDER — SODIUM CHLORIDE 0.9 % IV SOLN
125.0000 mg | Freq: Once | INTRAVENOUS | Status: AC
  Administered 2018-12-31: 125 mg via INTRAVENOUS
  Filled 2018-12-31: qty 10

## 2018-12-31 NOTE — Telephone Encounter (Signed)
Scheduled appt per 9/17 los.  Patient will get a print out in treatment.

## 2018-12-31 NOTE — Patient Instructions (Signed)
[name] Discharge Instructions for Patients Receiving Chemotherapy  Today you received the following chemotherapy agents Avastin  To help prevent nausea and vomiting after your treatment, we encourage you to take your nausea medication as directed.  If you develop nausea and vomiting that is not controlled by your nausea medication, call the clinic.   BELOW ARE SYMPTOMS THAT SHOULD BE REPORTED IMMEDIATELY:  *FEVER GREATER THAN 100.5 F  *CHILLS WITH OR WITHOUT FEVER  NAUSEA AND VOMITING THAT IS NOT CONTROLLED WITH YOUR NAUSEA MEDICATION  *UNUSUAL SHORTNESS OF BREATH  *UNUSUAL BRUISING OR BLEEDING  TENDERNESS IN MOUTH AND THROAT WITH OR WITHOUT PRESENCE OF ULCERS  *URINARY PROBLEMS  *BOWEL PROBLEMS  UNUSUAL RASH Items with * indicate a potential emergency and should be followed up as soon as possible.  Feel free to call the clinic should you have any questions or concerns. The clinic phone number is [phone].  Please show the [facility] at check-in to the Emergency Department and triage nurse.  Sodium Ferric Gluconate Complex injection What is this medicine? SODIUM FERRIC GLUCONATE COMPLEX (SOE dee um FER ik GLOO koe nate KOM pleks) is an iron replacement. It is used with epoetin therapy to treat low iron levels in patients who are receiving hemodialysis. This medicine may be used for other purposes; ask your health care provider or pharmacist if you have questions. COMMON BRAND NAME(S): Ferrlecit, Nulecit What should I tell my health care provider before I take this medicine? They need to know if you have any of the following conditions:  anemia that is not from iron deficiency  high levels of iron in the body  an unusual or allergic reaction to iron, benzyl alcohol, other medicines, foods, dyes, or preservatives  pregnant or are trying to become pregnant  breast-feeding How should I use this medicine? This medicine is for infusion into  a vein. It is given by a health care professional in a hospital or clinic setting. Talk to your pediatrician regarding the use of this medicine in children. While this drug may be prescribed for children as young as 16 years old for selected conditions, precautions do apply. Overdosage: If you think you have taken too much of this medicine contact a poison control center or emergency room at once. NOTE: This medicine is only for you. Do not share this medicine with others. What if I miss a dose? It is important not to miss your dose. Call your doctor or health care professional if you are unable to keep an appointment. What may interact with this medicine? Do not take this medicine with any of the following medications:  deferoxamine  dimercaprol  other iron products This medicine may also interact with the following medications:  chloramphenicol  deferasirox  medicine for blood pressure like enalapril This list may not describe all possible interactions. Give your health care provider a list of all the medicines, herbs, non-prescription drugs, or dietary supplements you use. Also tell them if you smoke, drink alcohol, or use illegal drugs. Some items may interact with your medicine. What should I watch for while using this medicine? Your condition will be monitored carefully while you are receiving this medicine. Visit your doctor for check-ups as directed. What side effects may I notice from receiving this medicine? Side effects that you should report to your doctor or health care professional as soon as possible:  allergic reactions like skin rash, itching or hives, swelling of the face, lips, or tongue  breathing problems  changes in hearing  changes in vision  chills, flushing, or sweating  fast, irregular heartbeat  feeling faint or lightheaded, falls  fever, flu-like symptoms  high or low blood pressure  pain, tingling, numbness in the hands or feet  severe pain  in the chest, back, flanks, or groin  swelling of the ankles, feet, hands  trouble passing urine or change in the amount of urine  unusually weak or tired Side effects that usually do not require medical attention (report to your doctor or health care professional if they continue or are bothersome):  cramps  dark colored stools  diarrhea  headache  nausea, vomiting  stomach upset This list may not describe all possible side effects. Call your doctor for medical advice about side effects. You may report side effects to FDA at 1-800-FDA-1088. Where should I keep my medicine? This drug is given in a hospital or clinic and will not be stored at home. NOTE: This sheet is a summary. It may not cover all possible information. If you have questions about this medicine, talk to your doctor, pharmacist, or health care provider.  2020 Elsevier/Gold Standard (2007-12-02 15:58:57)

## 2018-12-31 NOTE — Progress Notes (Signed)
Per Dr. Burr Medico: OK to treat with Bevacizumab without collecting another urine protein. If patient is able to give a specimen before she leaves then the lab can run the sample, otherwise we will cancel the order.

## 2019-01-01 DIAGNOSIS — M1990 Unspecified osteoarthritis, unspecified site: Secondary | ICD-10-CM | POA: Diagnosis not present

## 2019-01-04 DIAGNOSIS — M1990 Unspecified osteoarthritis, unspecified site: Secondary | ICD-10-CM | POA: Diagnosis not present

## 2019-01-05 DIAGNOSIS — M1990 Unspecified osteoarthritis, unspecified site: Secondary | ICD-10-CM | POA: Diagnosis not present

## 2019-01-06 DIAGNOSIS — M1990 Unspecified osteoarthritis, unspecified site: Secondary | ICD-10-CM | POA: Diagnosis not present

## 2019-01-07 ENCOUNTER — Inpatient Hospital Stay: Payer: Medicaid Other

## 2019-01-07 DIAGNOSIS — M1990 Unspecified osteoarthritis, unspecified site: Secondary | ICD-10-CM | POA: Diagnosis not present

## 2019-01-08 DIAGNOSIS — M1990 Unspecified osteoarthritis, unspecified site: Secondary | ICD-10-CM | POA: Diagnosis not present

## 2019-01-11 DIAGNOSIS — M1990 Unspecified osteoarthritis, unspecified site: Secondary | ICD-10-CM | POA: Diagnosis not present

## 2019-01-12 DIAGNOSIS — M1990 Unspecified osteoarthritis, unspecified site: Secondary | ICD-10-CM | POA: Diagnosis not present

## 2019-01-13 DIAGNOSIS — M1990 Unspecified osteoarthritis, unspecified site: Secondary | ICD-10-CM | POA: Diagnosis not present

## 2019-01-14 ENCOUNTER — Inpatient Hospital Stay: Payer: Medicaid Other

## 2019-01-14 ENCOUNTER — Inpatient Hospital Stay: Payer: Medicaid Other | Attending: Hematology

## 2019-01-14 DIAGNOSIS — Z5112 Encounter for antineoplastic immunotherapy: Secondary | ICD-10-CM | POA: Insufficient documentation

## 2019-01-14 DIAGNOSIS — D500 Iron deficiency anemia secondary to blood loss (chronic): Secondary | ICD-10-CM | POA: Insufficient documentation

## 2019-01-14 DIAGNOSIS — Z23 Encounter for immunization: Secondary | ICD-10-CM | POA: Insufficient documentation

## 2019-01-14 DIAGNOSIS — Z79899 Other long term (current) drug therapy: Secondary | ICD-10-CM | POA: Insufficient documentation

## 2019-01-14 DIAGNOSIS — R110 Nausea: Secondary | ICD-10-CM | POA: Insufficient documentation

## 2019-01-14 DIAGNOSIS — R040 Epistaxis: Secondary | ICD-10-CM | POA: Insufficient documentation

## 2019-01-14 DIAGNOSIS — I780 Hereditary hemorrhagic telangiectasia: Secondary | ICD-10-CM | POA: Insufficient documentation

## 2019-01-14 DIAGNOSIS — M1990 Unspecified osteoarthritis, unspecified site: Secondary | ICD-10-CM | POA: Diagnosis not present

## 2019-01-15 DIAGNOSIS — M1990 Unspecified osteoarthritis, unspecified site: Secondary | ICD-10-CM | POA: Diagnosis not present

## 2019-01-15 NOTE — Progress Notes (Signed)
The following biosimilar Zirabev (bevacizumab-bvzr) has been selected for use in this patient.  Maggie Shuda, PharmD, BCPS Oncology Pharmacist Pharmacy Phone: [phone] 01/15/2019     

## 2019-01-18 DIAGNOSIS — M1990 Unspecified osteoarthritis, unspecified site: Secondary | ICD-10-CM | POA: Diagnosis not present

## 2019-01-19 DIAGNOSIS — M1990 Unspecified osteoarthritis, unspecified site: Secondary | ICD-10-CM | POA: Diagnosis not present

## 2019-01-20 DIAGNOSIS — M1990 Unspecified osteoarthritis, unspecified site: Secondary | ICD-10-CM | POA: Diagnosis not present

## 2019-01-21 DIAGNOSIS — M1990 Unspecified osteoarthritis, unspecified site: Secondary | ICD-10-CM | POA: Diagnosis not present

## 2019-01-22 ENCOUNTER — Other Ambulatory Visit: Payer: Self-pay | Admitting: Hematology

## 2019-01-22 DIAGNOSIS — M1990 Unspecified osteoarthritis, unspecified site: Secondary | ICD-10-CM | POA: Diagnosis not present

## 2019-01-25 DIAGNOSIS — M1990 Unspecified osteoarthritis, unspecified site: Secondary | ICD-10-CM | POA: Diagnosis not present

## 2019-01-26 ENCOUNTER — Other Ambulatory Visit: Payer: Self-pay | Admitting: Nurse Practitioner

## 2019-01-26 DIAGNOSIS — M1990 Unspecified osteoarthritis, unspecified site: Secondary | ICD-10-CM | POA: Diagnosis not present

## 2019-01-26 MED ORDER — ALPRAZOLAM 0.5 MG PO TABS: ORAL_TABLET | ORAL | 0 refills | Status: DC

## 2019-01-27 DIAGNOSIS — M1990 Unspecified osteoarthritis, unspecified site: Secondary | ICD-10-CM | POA: Diagnosis not present

## 2019-01-28 ENCOUNTER — Inpatient Hospital Stay: Payer: Medicaid Other

## 2019-01-28 ENCOUNTER — Other Ambulatory Visit: Payer: Self-pay

## 2019-01-28 ENCOUNTER — Telehealth: Payer: Self-pay | Admitting: Nurse Practitioner

## 2019-01-28 ENCOUNTER — Inpatient Hospital Stay (HOSPITAL_BASED_OUTPATIENT_CLINIC_OR_DEPARTMENT_OTHER): Payer: Medicaid Other | Admitting: Nurse Practitioner

## 2019-01-28 ENCOUNTER — Encounter: Payer: Self-pay | Admitting: Nurse Practitioner

## 2019-01-28 VITALS — BP 137/67

## 2019-01-28 VITALS — BP 119/60 | HR 66 | Temp 98.7°F | Resp 18 | Ht 65.0 in | Wt 267.0 lb

## 2019-01-28 DIAGNOSIS — Z23 Encounter for immunization: Secondary | ICD-10-CM | POA: Diagnosis not present

## 2019-01-28 DIAGNOSIS — D500 Iron deficiency anemia secondary to blood loss (chronic): Secondary | ICD-10-CM | POA: Diagnosis not present

## 2019-01-28 DIAGNOSIS — M1990 Unspecified osteoarthritis, unspecified site: Secondary | ICD-10-CM | POA: Diagnosis not present

## 2019-01-28 DIAGNOSIS — I780 Hereditary hemorrhagic telangiectasia: Secondary | ICD-10-CM

## 2019-01-28 DIAGNOSIS — R040 Epistaxis: Secondary | ICD-10-CM | POA: Diagnosis not present

## 2019-01-28 DIAGNOSIS — F411 Generalized anxiety disorder: Secondary | ICD-10-CM | POA: Diagnosis not present

## 2019-01-28 DIAGNOSIS — Z79899 Other long term (current) drug therapy: Secondary | ICD-10-CM | POA: Diagnosis not present

## 2019-01-28 DIAGNOSIS — Z95828 Presence of other vascular implants and grafts: Secondary | ICD-10-CM

## 2019-01-28 DIAGNOSIS — R110 Nausea: Secondary | ICD-10-CM | POA: Diagnosis not present

## 2019-01-28 DIAGNOSIS — Z5112 Encounter for antineoplastic immunotherapy: Secondary | ICD-10-CM | POA: Diagnosis not present

## 2019-01-28 LAB — IRON AND TIBC
Iron: 18 ug/dL — ABNORMAL LOW (ref 41–142)
Saturation Ratios: 7 % — ABNORMAL LOW (ref 21–57)
TIBC: 261 ug/dL (ref 236–444)
UIBC: 243 ug/dL (ref 120–384)

## 2019-01-28 LAB — CMP (CANCER CENTER ONLY)
ALT: 7 U/L (ref 0–44)
AST: 16 U/L (ref 15–41)
Albumin: 3.4 g/dL — ABNORMAL LOW (ref 3.5–5.0)
Alkaline Phosphatase: 69 U/L (ref 38–126)
Anion gap: 10 (ref 5–15)
BUN: 10 mg/dL (ref 6–20)
CO2: 26 mmol/L (ref 22–32)
Calcium: 8.9 mg/dL (ref 8.9–10.3)
Chloride: 105 mmol/L (ref 98–111)
Creatinine: 0.97 mg/dL (ref 0.44–1.00)
GFR, Est AFR Am: >60 mL/min (ref >60)
GFR, Estimated: >60 mL/min (ref >60)
Glucose, Bld: 96 mg/dL (ref 70–99)
Potassium: 3.8 mmol/L (ref 3.5–5.1)
Sodium: 141 mmol/L (ref 135–145)
Total Bilirubin: 0.2 mg/dL — ABNORMAL LOW (ref 0.3–1.2)
Total Protein: 7.5 g/dL (ref 6.5–8.1)

## 2019-01-28 LAB — CBC WITH DIFFERENTIAL (CANCER CENTER ONLY)
Abs Immature Granulocytes: 0.04 10*3/uL (ref 0.00–0.07)
Basophils Absolute: 0 10*3/uL (ref 0.0–0.1)
Basophils Relative: 0 %
Eosinophils Absolute: 0.1 10*3/uL (ref 0.0–0.5)
Eosinophils Relative: 1 %
HCT: 29.3 % — ABNORMAL LOW (ref 36.0–46.0)
Hemoglobin: 8.8 g/dL — ABNORMAL LOW (ref 12.0–15.0)
Immature Granulocytes: 0 %
Lymphocytes Relative: 15 %
Lymphs Abs: 1.8 10*3/uL (ref 0.7–4.0)
MCH: 24.7 pg — ABNORMAL LOW (ref 26.0–34.0)
MCHC: 30 g/dL (ref 30.0–36.0)
MCV: 82.3 fL (ref 80.0–100.0)
Monocytes Absolute: 0.6 10*3/uL (ref 0.1–1.0)
Monocytes Relative: 5 %
Neutro Abs: 9.7 10*3/uL — ABNORMAL HIGH (ref 1.7–7.7)
Neutrophils Relative %: 79 %
Platelet Count: 283 10*3/uL (ref 150–400)
RBC: 3.56 MIL/uL — ABNORMAL LOW (ref 3.87–5.11)
RDW: 18.1 % — ABNORMAL HIGH (ref 11.5–15.5)
WBC Count: 12.2 10*3/uL — ABNORMAL HIGH (ref 4.0–10.5)
nRBC: 0 % (ref 0.0–0.2)

## 2019-01-28 LAB — RETICULOCYTES
Immature Retic Fract: 21 % — ABNORMAL HIGH (ref 2.3–15.9)
RBC.: 3.51 MIL/uL — ABNORMAL LOW (ref 3.87–5.11)
Retic Count, Absolute: 41.4 10*3/uL (ref 19.0–186.0)
Retic Ct Pct: 1.2 % (ref 0.4–3.1)

## 2019-01-28 LAB — FERRITIN: Ferritin: 45 ng/mL (ref 11–307)

## 2019-01-28 MED ORDER — INFLUENZA VAC SPLIT QUAD 0.5 ML IM SUSY
PREFILLED_SYRINGE | INTRAMUSCULAR | Status: AC
  Filled 2019-01-28: qty 0.5

## 2019-01-28 MED ORDER — SODIUM CHLORIDE 0.9% FLUSH
10.0000 mL | INTRAVENOUS | Status: DC | PRN
  Administered 2019-01-28: 17:00:00 10 mL
  Filled 2019-01-28: qty 10

## 2019-01-28 MED ORDER — HEPARIN SOD (PORK) LOCK FLUSH 100 UNIT/ML IV SOLN
500.0000 [IU] | Freq: Once | INTRAVENOUS | Status: AC | PRN
  Administered 2019-01-28: 17:00:00 500 [IU]
  Filled 2019-01-28: qty 5

## 2019-01-28 MED ORDER — SODIUM CHLORIDE 0.9 % IV SOLN
125.0000 mg | Freq: Once | INTRAVENOUS | Status: AC
  Administered 2019-01-28: 125 mg via INTRAVENOUS
  Filled 2019-01-28: qty 10

## 2019-01-28 MED ORDER — SODIUM CHLORIDE 0.9% FLUSH
10.0000 mL | Freq: Once | INTRAVENOUS | Status: AC
  Administered 2019-01-28: 10 mL
  Filled 2019-01-28: qty 10

## 2019-01-28 MED ORDER — INFLUENZA VAC SPLIT QUAD 0.5 ML IM SUSY
0.5000 mL | PREFILLED_SYRINGE | Freq: Once | INTRAMUSCULAR | Status: AC
  Administered 2019-01-28: 16:00:00 0.5 mL via INTRAMUSCULAR

## 2019-01-28 MED ORDER — SODIUM CHLORIDE 0.9 % IV SOLN
5.0000 mg/kg | Freq: Once | INTRAVENOUS | Status: AC
  Administered 2019-01-28: 16:00:00 600 mg via INTRAVENOUS
  Filled 2019-01-28: qty 16

## 2019-01-28 MED ORDER — SODIUM CHLORIDE 0.9 % IV SOLN
INTRAVENOUS | Status: DC
  Administered 2019-01-28: 15:00:00 via INTRAVENOUS
  Filled 2019-01-28: qty 250

## 2019-01-28 NOTE — Patient Instructions (Signed)
[name] Discharge Instructions for Patients Receiving Chemotherapy  Today you received the following chemotherapy agents Avastin  To help prevent nausea and vomiting after your treatment, we encourage you to take your nausea medication as directed.  If you develop nausea and vomiting that is not controlled by your nausea medication, call the clinic.   BELOW ARE SYMPTOMS THAT SHOULD BE REPORTED IMMEDIATELY:  *FEVER GREATER THAN 100.5 F  *CHILLS WITH OR WITHOUT FEVER  NAUSEA AND VOMITING THAT IS NOT CONTROLLED WITH YOUR NAUSEA MEDICATION  *UNUSUAL SHORTNESS OF BREATH  *UNUSUAL BRUISING OR BLEEDING  TENDERNESS IN MOUTH AND THROAT WITH OR WITHOUT PRESENCE OF ULCERS  *URINARY PROBLEMS  *BOWEL PROBLEMS  UNUSUAL RASH Items with * indicate a potential emergency and should be followed up as soon as possible.  Feel free to call the clinic should you have any questions or concerns. The clinic phone number is [phone].  Please show the [facility] at check-in to the Emergency Department and triage nurse.  Sodium Ferric Gluconate Complex injection What is this medicine? SODIUM FERRIC GLUCONATE COMPLEX (SOE dee um FER ik GLOO koe nate KOM pleks) is an iron replacement. It is used with epoetin therapy to treat low iron levels in patients who are receiving hemodialysis. This medicine may be used for other purposes; ask your health care provider or pharmacist if you have questions. COMMON BRAND NAME(S): Ferrlecit, Nulecit What should I tell my health care provider before I take this medicine? They need to know if you have any of the following conditions:  anemia that is not from iron deficiency  high levels of iron in the body  an unusual or allergic reaction to iron, benzyl alcohol, other medicines, foods, dyes, or preservatives  pregnant or are trying to become pregnant  breast-feeding How should I use this medicine? This medicine is for infusion into  a vein. It is given by a health care professional in a hospital or clinic setting. Talk to your pediatrician regarding the use of this medicine in children. While this drug may be prescribed for children as young as 16 years old for selected conditions, precautions do apply. Overdosage: If you think you have taken too much of this medicine contact a poison control center or emergency room at once. NOTE: This medicine is only for you. Do not share this medicine with others. What if I miss a dose? It is important not to miss your dose. Call your doctor or health care professional if you are unable to keep an appointment. What may interact with this medicine? Do not take this medicine with any of the following medications:  deferoxamine  dimercaprol  other iron products This medicine may also interact with the following medications:  chloramphenicol  deferasirox  medicine for blood pressure like enalapril This list may not describe all possible interactions. Give your health care provider a list of all the medicines, herbs, non-prescription drugs, or dietary supplements you use. Also tell them if you smoke, drink alcohol, or use illegal drugs. Some items may interact with your medicine. What should I watch for while using this medicine? Your condition will be monitored carefully while you are receiving this medicine. Visit your doctor for check-ups as directed. What side effects may I notice from receiving this medicine? Side effects that you should report to your doctor or health care professional as soon as possible:  allergic reactions like skin rash, itching or hives, swelling of the face, lips, or tongue  breathing problems  changes in hearing  changes in vision  chills, flushing, or sweating  fast, irregular heartbeat  feeling faint or lightheaded, falls  fever, flu-like symptoms  high or low blood pressure  pain, tingling, numbness in the hands or feet  severe pain  in the chest, back, flanks, or groin  swelling of the ankles, feet, hands  trouble passing urine or change in the amount of urine  unusually weak or tired Side effects that usually do not require medical attention (report to your doctor or health care professional if they continue or are bothersome):  cramps  dark colored stools  diarrhea  headache  nausea, vomiting  stomach upset This list may not describe all possible side effects. Call your doctor for medical advice about side effects. You may report side effects to FDA at 1-800-FDA-1088. Where should I keep my medicine? This drug is given in a hospital or clinic and will not be stored at home. NOTE: This sheet is a summary. It may not cover all possible information. If you have questions about this medicine, talk to your doctor, pharmacist, or health care provider.  2020 Elsevier/Gold Standard (2007-12-02 15:58:57)

## 2019-01-28 NOTE — Progress Notes (Signed)
Per Cira Rue, NP If patient is unable to void, ok to use results from last urine prt test.

## 2019-01-28 NOTE — Telephone Encounter (Signed)
Scheduled appt per 10/15 sch message - no answer - left message with appt date and time

## 2019-01-28 NOTE — Progress Notes (Signed)
[place]   [phone]) [phone] [phone]   Clinic Follow up Note   Patient Care Team: [name], MD as PCP - General (Internal Medicine) 01/28/2019   CHIEF COMPLAINT: F/uof HHT andAnemia   PREVIOUS THERAPY:  -Multiple EGD, small bowel enteroscope, C-scope with APC -Oral and iv amicar were given during hospital stay, no effect -IR embolization ofof the left gastric and short gastric artery branch vessels without evidence of residual flow in the Dieulafoy lesionon 12/10/17 -Avastin on 8/2 and 8/30 at [facility]; starting 12/26/17 continue 5mg /kg q2 weeksuntil 02/20/18, restarted on 04/03/2018  CURRENT THERAPY: -Tamoxifen started in 10/2017 for HHTstopped 12/10/18 due toDVT -Blood transfusion for severe anemia secondary to GI bleedingas needed -ivferric gluconate every1-2 weeks as needed with ferritin goal 100-200 -RestartedAvastin q2weeks on 04/03/18 -Amicar 1g bidstarting4/06/2018. Reduced to only as needed on 12/10/18 due toDVT  INTERVAL HISTORY: [name] returns for f/u and treatment as scheduled. She was last seen by Dr. Burr Medico on 12/31/18 and received Avastin and IV iron.  She was not aware of her appointment 2 weeks ago. She feels OK, no major changes. Her appetite remains fair, she has lost some weight. Has mild LUQ cramping lately. Chronic nausea is controlled with compazine, no vomiting. Stools are brown and watery without blood. She has 2 per day. Continues to have epistaxis, usually occurs every night. Bleeding is moderate but at baseline. She has some cough, hoarseness, and nasal drip. No sore throat or fever, chills. Denies chest pain or dyspnea. She takes PPI for heartburn. She has not seen GI recently, has an outstanding bill.    MEDICAL HISTORY:  Past Medical History:  Diagnosis Date  . Anxiety   . Arthritis    knnes,back  . GERD (gastroesophageal reflux disease)   . 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  History of swelling of feet   . Hyperlipidemia   . Hypertension   . Major depressive disorder, recurrent episode ([place]) 06/05/2015  . Obesity   . Snores     SURGICAL HISTORY: Past Surgical History:  Procedure Laterality Date  . ABDOMINAL HYSTERECTOMY    . CARPAL TUNNEL RELEASE  05/13/2011   Procedure: CARPAL TUNNEL RELEASE;  Surgeon: Nita Sells, MD;  Location: [place];  Service: Orthopedics;  Laterality: Left;  . COLONOSCOPY WITH PROPOFOL N/A 04/28/2014   Procedure: COLONOSCOPY WITH PROPOFOL;  Surgeon: Cleotis Nipper, MD;  Location: [facility] ENDOSCOPY;  Service: Endoscopy;  Laterality: N/A;  . DG TOES*L*  2/10   rt  . DILATION AND CURETTAGE OF UTERUS    . ENTEROSCOPY N/A 10/17/2017   Procedure: ENTEROSCOPY;  Surgeon: Otis Brace, MD;  Location: [place] ENDOSCOPY;  Service: Gastroenterology;  Laterality: N/A;  . ESOPHAGOGASTRODUODENOSCOPY N/A 04/10/2014   Procedure: ESOPHAGOGASTRODUODENOSCOPY (EGD);  Surgeon: [name], MD;  Location: [facility] ENDOSCOPY;  Service: Endoscopy;  Laterality: N/A;  . ESOPHAGOGASTRODUODENOSCOPY N/A 05/10/2017   Procedure: ESOPHAGOGASTRODUODENOSCOPY (EGD);  Surgeon: Ronald Lobo, MD;  Location: [facility] ENDOSCOPY;  Service: Endoscopy;  Laterality: N/A;  . ESOPHAGOGASTRODUODENOSCOPY N/A 09/22/2017   Procedure: ESOPHAGOGASTRODUODENOSCOPY (EGD);  Surgeon: Clarene Essex, MD;  Location: [place];  Service: Endoscopy;  Laterality: N/A;  bedside  . ESOPHAGOGASTRODUODENOSCOPY (EGD) WITH PROPOFOL N/A 04/27/2014   Procedure: ESOPHAGOGASTRODUODENOSCOPY (EGD) WITH PROPOFOL;  Surgeon: Cleotis Nipper, MD;  Location: [place];  Service: Endoscopy;  Laterality: N/A;  possible apc  . ESOPHAGOGASTRODUODENOSCOPY (EGD) WITH PROPOFOL N/A 09/30/2017   Procedure: ESOPHAGOGASTRODUODENOSCOPY (EGD) WITH PROPOFOL;  Surgeon: [name], MD;  Location: [facility] ENDOSCOPY;  Service: Gastroenterology;  Laterality: N/A;  . ESOPHAGOGASTRODUODENOSCOPY (EGD) WITH PROPOFOL  N/A 10/01/2017   Procedure: ESOPHAGOGASTRODUODENOSCOPY (EGD) WITH PROPOFOL;  Surgeon: [name], MD;  Location: [place];  Service: Gastroenterology;  Laterality: N/A;  . ESOPHAGOGASTRODUODENOSCOPY (EGD) WITH PROPOFOL N/A 10/08/2017   Procedure: ESOPHAGOGASTRODUODENOSCOPY (EGD) WITH PROPOFOL;  Surgeon: Otis Brace, MD;  Location: [facility] ENDOSCOPY;  Service: Gastroenterology;  Laterality: N/A;  . ESOPHAGOGASTRODUODENOSCOPY (EGD) WITH PROPOFOL N/A 10/17/2017   Procedure: ESOPHAGOGASTRODUODENOSCOPY (EGD) WITH PROPOFOL;  Surgeon: Otis Brace, MD;  Location: [facility] ENDOSCOPY;  Service: Gastroenterology;  Laterality: N/A;  . ESOPHAGOGASTRODUODENOSCOPY (EGD) WITH PROPOFOL N/A 10/19/2017   Procedure: ESOPHAGOGASTRODUODENOSCOPY (EGD) WITH PROPOFOL;  Surgeon: Otis Brace, MD;  Location: [facility] ENDOSCOPY;  Service: Gastroenterology;  Laterality: N/A;  . ESOPHAGOGASTRODUODENOSCOPY (EGD) WITH PROPOFOL N/A 12/04/2018   Procedure: ESOPHAGOGASTRODUODENOSCOPY (EGD) WITH PROPOFOL;  Surgeon: Wilford Corner, MD;  Location: [facility] ENDOSCOPY;  Service: Endoscopy;  Laterality: N/A;  . GIVENS CAPSULE STUDY N/A 10/02/2017   Procedure: GIVENS CAPSULE STUDY;  Surgeon: [name], MD;  Location: [place];  Service: Gastroenterology;  Laterality: N/A;  . GIVENS CAPSULE STUDY N/A 10/08/2017   Procedure: GIVENS CAPSULE STUDY;  Surgeon: Otis Brace, MD;  Location: [place];  Service: Gastroenterology;  Laterality: N/A;  endoscopic placement of capsule  . HEMOSTASIS CLIP PLACEMENT  12/04/2018   Procedure: HEMOSTASIS CLIP PLACEMENT;  Surgeon: Wilford Corner, MD;  Location: [facility] ENDOSCOPY;  Service: Endoscopy;;  . HOT HEMOSTASIS N/A 04/27/2014   Procedure: HOT HEMOSTASIS (ARGON PLASMA COAGULATION/BICAP);  Surgeon: Cleotis Nipper, MD;  Location: [facility] ENDOSCOPY;  Service: Endoscopy;  Laterality: N/A;  . HOT HEMOSTASIS N/A 09/30/2017   Procedure: HOT HEMOSTASIS (ARGON PLASMA COAGULATION/BICAP);  Surgeon: [name],  MD;  Location: [place];  Service: Gastroenterology;  Laterality: N/A;  . HOT HEMOSTASIS N/A 10/01/2017   Procedure: HOT HEMOSTASIS (ARGON PLASMA COAGULATION/BICAP);  Surgeon: [name], MD;  Location: [place];  Service: Gastroenterology;  Laterality: N/A;  . HOT HEMOSTASIS N/A 10/17/2017   Procedure: HOT HEMOSTASIS (ARGON PLASMA COAGULATION/BICAP);  Surgeon: Otis Brace, MD;  Location: [facility] ENDOSCOPY;  Service: Gastroenterology;  Laterality: N/A;  . HOT HEMOSTASIS N/A 10/19/2017   Procedure: HOT HEMOSTASIS (ARGON PLASMA COAGULATION/BICAP);  Surgeon: Otis Brace, MD;  Location: [facility] ENDOSCOPY;  Service: Gastroenterology;  Laterality: N/A;  . IR IMAGING GUIDED PORT INSERTION  07/08/2018  . L shoulder Surgery  2011  . SUBMUCOSAL INJECTION  09/22/2017   Procedure: SUBMUCOSAL INJECTION;  Surgeon: Clarene Essex, MD;  Location: [facility] ENDOSCOPY;  Service: Endoscopy;;  . SUBMUCOSAL INJECTION  12/04/2018   Procedure: SUBMUCOSAL INJECTION;  Surgeon: Wilford Corner, MD;  Location: [facility] ENDOSCOPY;  Service: Endoscopy;;    I have reviewed the social history and family history with the patient and they are unchanged from previous note.  ALLERGIES:  is allergic to feraheme [ferumoxytol]; nsaids; tomato; and wasp venom.  MEDICATIONS:  Current Outpatient Medications  Medication Sig Dispense Refill  . albuterol (PROVENTIL HFA;VENTOLIN HFA) 108 (90 Base) MCG/ACT inhaler Inhale 1-2 puffs into the lungs every 6 (six) hours as needed for wheezing or shortness of breath. 1 Inhaler 3  . ALPRAZolam (XANAX) 0.5 MG tablet TAKE 1 TABLET BY MOUTH 2 TIMES A DAY AS NEEDED FOR ANXIETY 60 tablet 0  . Aminocaproic Acid (AMICAR) 1000 MG TABS Take 1 tablet (1,000 mg total) by mouth 2 (two) times daily. 60 each 3  . amLODipine (NORVASC) 10 MG tablet TAKE 1 TABLET(10 MG) BY MOUTH DAILY 90 tablet 0  . atorvastatin (LIPITOR)  80 MG tablet TAKE 1 TABLET(80 MG) BY MOUTH DAILY (Patient taking differently: Take 80 mg by mouth  daily. ) 30 tablet 3  . diphenhydramine-acetaminophen (TYLENOL PM) 25-500 MG TABS tablet Take 1 tablet by mouth at bedtime as needed (sleep).    [name] guaifenesin (ROBITUSSIN) 100 MG/5ML syrup Take 200 mg by mouth 3 (three) times daily as needed for cough or congestion.    . hydrochlorothiazide (HYDRODIURIL) 12.5 MG tablet TAKE 1 TABLET(12.5 MG) BY MOUTH DAILY (Patient taking differently: Take 12.5 mg by mouth daily. ) 90 tablet 0  . lidocaine-prilocaine (EMLA) cream Apply 1 application topically as needed. 30 g 0  . pantoprazole (PROTONIX) 40 MG tablet Take 1 tablet (40 mg total) by mouth 2 (two) times daily. 60 tablet 5  . potassium chloride (KLOR-CON) 20 MEQ packet Take 20 mEq by mouth daily. 15 packet 0  . prochlorperazine (COMPAZINE) 10 MG tablet Take 1 tablet (10 mg total) by mouth every 6 (six) hours as needed for nausea or vomiting. 30 tablet 1  . sertraline (ZOLOFT) 50 MG tablet Take 1 tablet (50 mg total) by mouth daily. 30 tablet 2   No current facility-administered medications for this visit.    Facility-Administered Medications Ordered in Other Visits  Medication Dose Route Frequency Provider Last Rate Last Dose  . 0.9 %  sodium chloride infusion   Intravenous Once Truitt Merle, MD        PHYSICAL EXAMINATION: ECOG PERFORMANCE STATUS: 2 - Symptomatic, <50% confined to bed  Vitals:   01/28/19 1336  BP: 119/60  Pulse: 66  Resp: 18  Temp: 98.7 F (37.1 C)  SpO2: 100%   Filed Weights   01/28/19 1336  Weight: 267 lb (121.1 kg)    GENERAL:alert, no distress and comfortable SKIN: no rash  EYES:  sclera clear NECK: without swelling or mass  LUNGS: clear with normal breathing effort HEART: regular rate & rhythm, no lower extremity edema ABDOMEN:abdomen soft, non-tender and normal bowel sounds. No LUQ tenderness NEURO: alert & oriented x 3 with fluent speech, normal gait  PAC without erythema   LABORATORY DATA:  I have reviewed the data as listed CBC Latest Ref Rng & Units  01/28/2019 12/31/2018 12/10/2018  WBC 4.0 - 10.5 K/uL 12.2(H) 7.4 10.7(H)  Hemoglobin 12.0 - 15.0 g/dL 8.8(L) 9.3(L) 9.3(L)  Hematocrit 36.0 - 46.0 % 29.3(L) 30.9(L) 30.4(L)  Platelets 150 - 400 K/uL 283 206 270     CMP Latest Ref Rng & Units 01/28/2019 12/31/2018 12/10/2018  Glucose 70 - 99 mg/dL 96 140(H) 110(H)  BUN 6 - 20 mg/dL 10 8 13   Creatinine 0.44 - 1.00 mg/dL 0.97 0.98 1.20(H)  Sodium 135 - 145 mmol/L 141 142 143  Potassium 3.5 - 5.1 mmol/L 3.8 3.2(L) 3.0(LL)  Chloride 98 - 111 mmol/L 105 103 104  CO2 22 - 32 mmol/L 26 29 29   Calcium 8.9 - 10.3 mg/dL 8.9 8.5(L) 8.2(L)  Total Protein 6.5 - 8.1 g/dL 7.5 6.6 6.4(L)  Total Bilirubin 0.3 - 1.2 mg/dL 0.2(L) 0.3 0.6  Alkaline Phos 38 - 126 U/L 69 71 55  AST 15 - 41 U/L 16 15 19   ALT 0 - 44 U/L 7 6 13       RADIOGRAPHIC STUDIES: I have personally reviewed the radiological images as listed and agreed with the findings in the report. No results found.   ASSESSMENT & PLAN: DACI ARNT a 58 y.o.femalewith   1. 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 Reactive Leukocytosis and reactive thrombocytosis  4. Chronic Epistaxis, h/o recurrent GI Bleeding; Followed byGI, Dr. Michail Sermon and [facility] 5. Right arm DVT, found during hospitalization 11/2018 - not a candidate for  anticoagulation due to severe GI bleeding   Disposition: [name] appears stable. She continues Avastin q2 weeks, she missed her last appointment, last dose 1 month ago. She also continues IV iron frequently. Clinically she has no evidence of recurrent GI bleeding. However, her Hgb is trending down to 8.8 today, possibly due to missed dose. Labs reviewed. Ferritin 45. She will proceed with Avastin and IV ferric gluconate today. I recommend IV iron weekly x4. She will continue avastin q2 weeks. She will schedule GI f/u when her outstanding balance is resolved.   We reviewed symptom management for diarrhea including the use of imodium PRN. For hoarseness and PND she can try OTC allergy med. I also reviewed to use Amicar PRN for increased bleeding. For pain management, I recommend to continue tylenol for aches and pains, avoid NSAIDs, aspirin due to bleeding risk.   F/u in 4 weeks.    All questions were answered. The patient knows to call the clinic with any problems, questions or concerns. No barriers to learning was detected.    Alla Feeling, NP 01/28/19

## 2019-01-29 DIAGNOSIS — M1990 Unspecified osteoarthritis, unspecified site: Secondary | ICD-10-CM | POA: Diagnosis not present

## 2019-01-30 ENCOUNTER — Inpatient Hospital Stay: Payer: Medicaid Other

## 2019-02-01 DIAGNOSIS — M1990 Unspecified osteoarthritis, unspecified site: Secondary | ICD-10-CM | POA: Diagnosis not present

## 2019-02-02 DIAGNOSIS — M1990 Unspecified osteoarthritis, unspecified site: Secondary | ICD-10-CM | POA: Diagnosis not present

## 2019-02-03 DIAGNOSIS — M1990 Unspecified osteoarthritis, unspecified site: Secondary | ICD-10-CM | POA: Diagnosis not present

## 2019-02-04 DIAGNOSIS — M1990 Unspecified osteoarthritis, unspecified site: Secondary | ICD-10-CM | POA: Diagnosis not present

## 2019-02-05 ENCOUNTER — Telehealth: Payer: Self-pay | Admitting: Nurse Practitioner

## 2019-02-05 ENCOUNTER — Telehealth: Payer: Self-pay | Admitting: Internal Medicine

## 2019-02-05 DIAGNOSIS — M1990 Unspecified osteoarthritis, unspecified site: Secondary | ICD-10-CM | POA: Diagnosis not present

## 2019-02-05 NOTE — Telephone Encounter (Signed)
Open wounds on Both Feet hurting.

## 2019-02-05 NOTE — Telephone Encounter (Signed)
Scheduled appt per 10/15 los.  Left a VM of her next upcoming appt on 10/29.

## 2019-02-05 NOTE — Telephone Encounter (Signed)
Call transferred to triage. Rayburn Ma, RN Case Manager with [facility] [phone]) also on call but is not present in patient's home. Patient states that her husband was trying to remove calluses from the bottom of both her feet when he discovered multiple puncture type wounds, "like I stepped on nails." Patient wanting to wait till her appt with PCP on 02/22/2019 to have these looked at. Explained importance of having these evaluated immediately to prevent serious infections. Advised to head directly to Urgent Care or ED. Patient states her daughter-in-law gets home at 58 and will ask her to drive her then. Hubbard Hartshorn, BSN, RN-BC

## 2019-02-06 ENCOUNTER — Ambulatory Visit (HOSPITAL_COMMUNITY)
Admission: EM | Admit: 2019-02-06 | Discharge: 2019-02-06 | Disposition: A | Discharge status: Home / Self Care | Payer: Medicaid Other | Attending: Family Medicine | Admitting: Family Medicine

## 2019-02-06 ENCOUNTER — Other Ambulatory Visit: Payer: Self-pay

## 2019-02-06 ENCOUNTER — Encounter (HOSPITAL_COMMUNITY): Payer: Self-pay

## 2019-02-06 DIAGNOSIS — M79671 Pain in right foot: Secondary | ICD-10-CM

## 2019-02-06 DIAGNOSIS — M79672 Pain in left foot: Secondary | ICD-10-CM | POA: Diagnosis not present

## 2019-02-06 DIAGNOSIS — I10 Essential (primary) hypertension: Secondary | ICD-10-CM

## 2019-02-06 DIAGNOSIS — L84 Corns and callosities: Secondary | ICD-10-CM

## 2019-02-06 MED ORDER — TRAMADOL HCL 50 MG PO TABS: 50.0000 mg | ORAL_TABLET | Freq: Four times a day (QID) | ORAL | 0 refills | Status: DC | PRN

## 2019-02-06 NOTE — Telephone Encounter (Signed)
I agree. Thank you.

## 2019-02-06 NOTE — ED Triage Notes (Signed)
Pt presents to the UC for wound check in both feet. Pt states she has the wound x 1 month.

## 2019-02-06 NOTE — Discharge Instructions (Signed)
Be aware, pain medications may cause drowsiness. Please do not drive, operate heavy machinery or make important decisions while on this medication, it can cloud your judgement.  

## 2019-02-08 DIAGNOSIS — M1990 Unspecified osteoarthritis, unspecified site: Secondary | ICD-10-CM | POA: Diagnosis not present

## 2019-02-08 NOTE — ED Provider Notes (Signed)
[place]   [id number] 02/06/19 Arrival Time: [id number]  ASSESSMENT & PLAN:  1. Bilateral foot pain   2. Plantar callus   3. Essential hypertension     No signs of infection of exam of feet. Discuss that she should see a podiatrist or other foot specialist given the extensive and very thick callus formation on her feet.  To aid in sleep and pain: Meds ordered this encounter  Medications   traMADol (ULTRAM) 50 MG tablet    Sig: Take 1 tablet (50 mg total) by mouth every 6 (six) hours as needed.    Dispense:  12 tablet    Refill:  0    [place] Controlled Substances Registry consulted for this patient. I feel the risk/benefit ratio today is favorable for proceeding with this prescription for a controlled substance. Medication sedation precautions given.  Recommend: Follow-up Information    Schedule an appointment as soon as possible for a visit  with [facility].   Contact information: [address] [place] [address] [phone]          Will also follow up with PCP to discuss and monitor blood pressure.  Reviewed expectations re: course of current medical issues. Questions answered. Outlined signs and symptoms indicating need for more acute intervention. Patient verbalized understanding. After Visit Summary given.  SUBJECTIVE: History from: patient. Peggy House is a 58 y.o. female who reports persistent marked pain of her bilateral feet; described as aching; without radiation. Onset: gradual. First noted: on/off for many months; reports husband "saw some dots on the bottom of my feet and I wanted to have them checked". Injury/trama: no. Symptoms have waxed and waned but are worse overall since beginning. Aggravating factors: weight bearing. Alleviating factors: rest. Associated symptoms: none reported. Extremity sensation changes or weakness: none. Self treatment: tried OTCs without relief of pain. History of similar: yes.  Past  Surgical History:  Procedure Laterality Date   ABDOMINAL HYSTERECTOMY     CARPAL TUNNEL RELEASE  05/13/2011   Procedure: CARPAL TUNNEL RELEASE;  Surgeon: Nita Sells, MD;  Location: [place];  Service: Orthopedics;  Laterality: Left;   COLONOSCOPY WITH PROPOFOL N/A 04/28/2014   Procedure: COLONOSCOPY WITH PROPOFOL;  Surgeon: Cleotis Nipper, MD;  Location: [facility] ENDOSCOPY;  Service: Endoscopy;  Laterality: N/A;   DG TOES*L*  2/10   rt   DILATION AND CURETTAGE OF UTERUS     ENTEROSCOPY N/A 10/17/2017   Procedure: ENTEROSCOPY;  Surgeon: Otis Brace, MD;  Location: [place];  Service: Gastroenterology;  Laterality: N/A;   ESOPHAGOGASTRODUODENOSCOPY N/A 04/10/2014   Procedure: ESOPHAGOGASTRODUODENOSCOPY (EGD);  Surgeon: [name], MD;  Location: [facility] ENDOSCOPY;  Service: Endoscopy;  Laterality: N/A;   ESOPHAGOGASTRODUODENOSCOPY N/A 05/10/2017   Procedure: ESOPHAGOGASTRODUODENOSCOPY (EGD);  Surgeon: Ronald Lobo, MD;  Location: [facility] ENDOSCOPY;  Service: Endoscopy;  Laterality: N/A;   ESOPHAGOGASTRODUODENOSCOPY N/A 09/22/2017   Procedure: ESOPHAGOGASTRODUODENOSCOPY (EGD);  Surgeon: Clarene Essex, MD;  Location: [place];  Service: Endoscopy;  Laterality: N/A;  bedside   ESOPHAGOGASTRODUODENOSCOPY (EGD) WITH PROPOFOL N/A 04/27/2014   Procedure: ESOPHAGOGASTRODUODENOSCOPY (EGD) WITH PROPOFOL;  Surgeon: Cleotis Nipper, MD;  Location: [place];  Service: Endoscopy;  Laterality: N/A;  possible apc   ESOPHAGOGASTRODUODENOSCOPY (EGD) WITH PROPOFOL N/A 09/30/2017   Procedure: ESOPHAGOGASTRODUODENOSCOPY (EGD) WITH PROPOFOL;  Surgeon: [name], MD;  Location: [place];  Service: Gastroenterology;  Laterality: N/A;   ESOPHAGOGASTRODUODENOSCOPY (EGD) WITH PROPOFOL N/A 10/01/2017   Procedure: ESOPHAGOGASTRODUODENOSCOPY (EGD) WITH PROPOFOL;  Surgeon:  [name], MD;  Location: [facility] ENDOSCOPY;  Service: Gastroenterology;  Laterality: N/A;    ESOPHAGOGASTRODUODENOSCOPY (EGD) WITH PROPOFOL N/A 10/08/2017   Procedure: ESOPHAGOGASTRODUODENOSCOPY (EGD) WITH PROPOFOL;  Surgeon: Otis Brace, MD;  Location: [place];  Service: Gastroenterology;  Laterality: N/A;   ESOPHAGOGASTRODUODENOSCOPY (EGD) WITH PROPOFOL N/A 10/17/2017   Procedure: ESOPHAGOGASTRODUODENOSCOPY (EGD) WITH PROPOFOL;  Surgeon: Otis Brace, MD;  Location: [place];  Service: Gastroenterology;  Laterality: N/A;   ESOPHAGOGASTRODUODENOSCOPY (EGD) WITH PROPOFOL N/A 10/19/2017   Procedure: ESOPHAGOGASTRODUODENOSCOPY (EGD) WITH PROPOFOL;  Surgeon: Otis Brace, MD;  Location: [place];  Service: Gastroenterology;  Laterality: N/A;   ESOPHAGOGASTRODUODENOSCOPY (EGD) WITH PROPOFOL N/A 12/04/2018   Procedure: ESOPHAGOGASTRODUODENOSCOPY (EGD) WITH PROPOFOL;  Surgeon: Wilford Corner, MD;  Location: [facility] ENDOSCOPY;  Service: Endoscopy;  Laterality: N/A;   GIVENS CAPSULE STUDY N/A 10/02/2017   Procedure: GIVENS CAPSULE STUDY;  Surgeon: [name], MD;  Location: [place];  Service: Gastroenterology;  Laterality: N/A;   GIVENS CAPSULE STUDY N/A 10/08/2017   Procedure: GIVENS CAPSULE STUDY;  Surgeon: Otis Brace, MD;  Location: [place];  Service: Gastroenterology;  Laterality: N/A;  endoscopic placement of capsule   HEMOSTASIS CLIP PLACEMENT  12/04/2018   Procedure: HEMOSTASIS CLIP PLACEMENT;  Surgeon: Wilford Corner, MD;  Location: [facility] ENDOSCOPY;  Service: Endoscopy;;   HOT HEMOSTASIS N/A 04/27/2014   Procedure: HOT HEMOSTASIS (ARGON PLASMA COAGULATION/BICAP);  Surgeon: Cleotis Nipper, MD;  Location: [facility] ENDOSCOPY;  Service: Endoscopy;  Laterality: N/A;   HOT HEMOSTASIS N/A 09/30/2017   Procedure: HOT HEMOSTASIS (ARGON PLASMA COAGULATION/BICAP);  Surgeon: [name], MD;  Location: [place];  Service: Gastroenterology;  Laterality: N/A;   HOT HEMOSTASIS N/A 10/01/2017   Procedure: HOT HEMOSTASIS (ARGON PLASMA COAGULATION/BICAP);  Surgeon:  [name], MD;  Location: [place];  Service: Gastroenterology;  Laterality: N/A;   HOT HEMOSTASIS N/A 10/17/2017   Procedure: HOT HEMOSTASIS (ARGON PLASMA COAGULATION/BICAP);  Surgeon: Otis Brace, MD;  Location: [facility] ENDOSCOPY;  Service: Gastroenterology;  Laterality: N/A;   HOT HEMOSTASIS N/A 10/19/2017   Procedure: HOT HEMOSTASIS (ARGON PLASMA COAGULATION/BICAP);  Surgeon: Otis Brace, MD;  Location: [facility] ENDOSCOPY;  Service: Gastroenterology;  Laterality: N/A;   IR IMAGING GUIDED PORT INSERTION  07/08/2018   L shoulder Surgery  2011   SUBMUCOSAL INJECTION  09/22/2017   Procedure: SUBMUCOSAL INJECTION;  Surgeon: Clarene Essex, MD;  Location: [facility] ENDOSCOPY;  Service: Endoscopy;;   SUBMUCOSAL INJECTION  12/04/2018   Procedure: SUBMUCOSAL INJECTION;  Surgeon: Wilford Corner, MD;  Location: [facility] ENDOSCOPY;  Service: Endoscopy;;    Increased blood pressure noted today. Reports that she has not taken her medication today.  She reports no chest pain on exertion, no dyspnea on exertion, no swelling of ankles, no orthostatic dizziness or lightheadedness, no orthopnea or paroxysmal nocturnal dyspnea, no palpitations and no intermittent claudication symptoms.  ROS: As per HPI. All other systems negative.    OBJECTIVE:  Vitals:   02/06/19 1537  BP: (!) 146/77  Pulse: 76  Resp: 17  Temp: 98.7 F (37.1 C)  TempSrc: Oral  SpO2: 98%    General appearance: alert; no distress HEENT: [place]; AT Neck: supple with FROM CV: regular Resp: unlabored respirations Extremities:  Bilateral feet: warm with well perfused appearance; poorly localized moderate tenderness over bilateral plantar feet; extensive moccasin distribution of significant callus formation; without swelling of ankles; bruising: none; ankle and toes with FROM; one area of R heel where callus has fractured revealing dermis underneath - she does reports some tenderness over this area; no bleeding CV:  brisk extremity capillary  refill of bilateral LE; overall difficult to feel pedal pulses but skin is warm and appears well-perfused Skin: warm and dry; no visible rashes Neurologic: gait normal; normal reflexes of bilateral LE; normal sensation of bilateral LE; normal strength of bilateral LE Psychological: alert and cooperative; normal mood and affect    Allergies  Allergen Reactions   Feraheme [Ferumoxytol] Other (See Comments)    Muscle tetany, encephalopathy, fatigue, nausea (reaction to injection)   Nsaids Other (See Comments)    Stomach bleeding episodes; Tylenol is OK   Tomato Hives   Wasp Venom Swelling    Past Medical History:  Diagnosis Date   Anxiety    Arthritis    knnes,back   GERD (gastroesophageal reflux disease)    Hereditary hemorrhagic telangiectasia (HCC)    History of swelling of feet    Hyperlipidemia    Hypertension    Major depressive disorder, recurrent episode ([place]) 06/05/2015   Obesity    Snores    Social History   Socioeconomic History   Marital status: Single    Spouse name: Not on file   Number of children: Not on file   Years of education: Not on file   Highest education level: Not on file  Occupational History   Not on file  Social Needs   Financial resource strain: Not on file   Food insecurity    Worry: Not on file    Inability: Not on file   Transportation needs    Medical: Not on file    Non-medical: Not on file  Tobacco Use   Smoking status: Current Every Day Smoker    Packs/day: 0.50    Years: 30.00    Pack years: 15.00    Types: Cigarettes   Smokeless tobacco: Former Systems developer    Types: Snuff    Quit date: 1981   Tobacco comment: 1/2 PPD  Substance and Sexual Activity   Alcohol use: No    Alcohol/week: 0.0 standard drinks   Drug use: No    Comment: last cocaine-2010   Sexual activity: Yes    Birth control/protection: Surgical    Comment: Hysterectomy  Lifestyle   Physical activity    Days per week: Not on file     Minutes per session: Not on file   Stress: Not on file  Relationships   Social connections    Talks on phone: Not on file    Gets together: Not on file    Attends religious service: Not on file    Active member of club or organization: Not on file    Attends meetings of clubs or organizations: Not on file    Relationship status: Not on file  Other Topics Concern   Not on file  Social History Narrative   Not on file   Family History  Problem Relation Age of Onset   Cancer Mother        Ovarian   Ovarian cancer Mother    Diabetes Mother    Kidney disease Mother    Bleeding Disorder Mother    Diabetes type II Sister    Bleeding Disorder Sister    Diabetes Sister    Colon cancer Maternal Grandfather    Crohn's disease Maternal Grandfather    Stomach cancer Maternal Grandmother    Kidney disease Son    Bleeding Disorder Son    Dysmenorrhea Neg Hx    Past Surgical History:  Procedure Laterality Date   ABDOMINAL HYSTERECTOMY  CARPAL TUNNEL RELEASE  05/13/2011   Procedure: CARPAL TUNNEL RELEASE;  Surgeon: Nita Sells, MD;  Location: [place];  Service: Orthopedics;  Laterality: Left;   COLONOSCOPY WITH PROPOFOL N/A 04/28/2014   Procedure: COLONOSCOPY WITH PROPOFOL;  Surgeon: Cleotis Nipper, MD;  Location: [facility] ENDOSCOPY;  Service: Endoscopy;  Laterality: N/A;   DG TOES*L*  2/10   rt   DILATION AND CURETTAGE OF UTERUS     ENTEROSCOPY N/A 10/17/2017   Procedure: ENTEROSCOPY;  Surgeon: Otis Brace, MD;  Location: [place];  Service: Gastroenterology;  Laterality: N/A;   ESOPHAGOGASTRODUODENOSCOPY N/A 04/10/2014   Procedure: ESOPHAGOGASTRODUODENOSCOPY (EGD);  Surgeon: [name], MD;  Location: [facility] ENDOSCOPY;  Service: Endoscopy;  Laterality: N/A;   ESOPHAGOGASTRODUODENOSCOPY N/A 05/10/2017   Procedure: ESOPHAGOGASTRODUODENOSCOPY (EGD);  Surgeon: Ronald Lobo, MD;  Location: [facility] ENDOSCOPY;  Service:  Endoscopy;  Laterality: N/A;   ESOPHAGOGASTRODUODENOSCOPY N/A 09/22/2017   Procedure: ESOPHAGOGASTRODUODENOSCOPY (EGD);  Surgeon: Clarene Essex, MD;  Location: [place];  Service: Endoscopy;  Laterality: N/A;  bedside   ESOPHAGOGASTRODUODENOSCOPY (EGD) WITH PROPOFOL N/A 04/27/2014   Procedure: ESOPHAGOGASTRODUODENOSCOPY (EGD) WITH PROPOFOL;  Surgeon: Cleotis Nipper, MD;  Location: [place];  Service: Endoscopy;  Laterality: N/A;  possible apc   ESOPHAGOGASTRODUODENOSCOPY (EGD) WITH PROPOFOL N/A 09/30/2017   Procedure: ESOPHAGOGASTRODUODENOSCOPY (EGD) WITH PROPOFOL;  Surgeon: [name], MD;  Location: [place];  Service: Gastroenterology;  Laterality: N/A;   ESOPHAGOGASTRODUODENOSCOPY (EGD) WITH PROPOFOL N/A 10/01/2017   Procedure: ESOPHAGOGASTRODUODENOSCOPY (EGD) WITH PROPOFOL;  Surgeon: [name], MD;  Location: [place];  Service: Gastroenterology;  Laterality: N/A;   ESOPHAGOGASTRODUODENOSCOPY (EGD) WITH PROPOFOL N/A 10/08/2017   Procedure: ESOPHAGOGASTRODUODENOSCOPY (EGD) WITH PROPOFOL;  Surgeon: Otis Brace, MD;  Location: [place];  Service: Gastroenterology;  Laterality: N/A;   ESOPHAGOGASTRODUODENOSCOPY (EGD) WITH PROPOFOL N/A 10/17/2017   Procedure: ESOPHAGOGASTRODUODENOSCOPY (EGD) WITH PROPOFOL;  Surgeon: Otis Brace, MD;  Location: [place];  Service: Gastroenterology;  Laterality: N/A;   ESOPHAGOGASTRODUODENOSCOPY (EGD) WITH PROPOFOL N/A 10/19/2017   Procedure: ESOPHAGOGASTRODUODENOSCOPY (EGD) WITH PROPOFOL;  Surgeon: Otis Brace, MD;  Location: [place];  Service: Gastroenterology;  Laterality: N/A;   ESOPHAGOGASTRODUODENOSCOPY (EGD) WITH PROPOFOL N/A 12/04/2018   Procedure: ESOPHAGOGASTRODUODENOSCOPY (EGD) WITH PROPOFOL;  Surgeon: Wilford Corner, MD;  Location: [facility] ENDOSCOPY;  Service: Endoscopy;  Laterality: N/A;   GIVENS CAPSULE STUDY N/A 10/02/2017   Procedure: GIVENS CAPSULE STUDY;  Surgeon: [name], MD;  Location: [place];  Service: Gastroenterology;  Laterality: N/A;   GIVENS CAPSULE STUDY N/A 10/08/2017   Procedure: GIVENS CAPSULE STUDY;  Surgeon: Otis Brace, MD;  Location: [place];  Service: Gastroenterology;  Laterality: N/A;  endoscopic placement of capsule   HEMOSTASIS CLIP PLACEMENT  12/04/2018   Procedure: HEMOSTASIS CLIP PLACEMENT;  Surgeon: Wilford Corner, MD;  Location: [facility] ENDOSCOPY;  Service: Endoscopy;;   HOT HEMOSTASIS N/A 04/27/2014   Procedure: HOT HEMOSTASIS (ARGON PLASMA COAGULATION/BICAP);  Surgeon: Cleotis Nipper, MD;  Location: [facility] ENDOSCOPY;  Service: Endoscopy;  Laterality: N/A;   HOT HEMOSTASIS N/A 09/30/2017   Procedure: HOT HEMOSTASIS (ARGON PLASMA COAGULATION/BICAP);  Surgeon: [name], MD;  Location: [place];  Service: Gastroenterology;  Laterality: N/A;   HOT HEMOSTASIS N/A 10/01/2017   Procedure: HOT HEMOSTASIS (ARGON PLASMA COAGULATION/BICAP);  Surgeon: [name], MD;  Location: [place];  Service: Gastroenterology;  Laterality: N/A;   HOT HEMOSTASIS N/A 10/17/2017   Procedure: HOT HEMOSTASIS (ARGON PLASMA COAGULATION/BICAP);  Surgeon: Otis Brace, MD;  Location: [facility] ENDOSCOPY;  Service: Gastroenterology;  Laterality: N/A;   HOT  HEMOSTASIS N/A 10/19/2017   Procedure: HOT HEMOSTASIS (ARGON PLASMA COAGULATION/BICAP);  Surgeon: Otis Brace, MD;  Location: [facility] ENDOSCOPY;  Service: Gastroenterology;  Laterality: N/A;   IR IMAGING GUIDED PORT INSERTION  07/08/2018   L shoulder Surgery  2011   SUBMUCOSAL INJECTION  09/22/2017   Procedure: SUBMUCOSAL INJECTION;  Surgeon: Clarene Essex, MD;  Location: [facility] ENDOSCOPY;  Service: Endoscopy;;   SUBMUCOSAL INJECTION  12/04/2018   Procedure: SUBMUCOSAL INJECTION;  Surgeon: Wilford Corner, MD;  Location: [facility] ENDOSCOPY;  Service: Endoscopy;;      Vanessa Kick, MD 02/08/19 [phone]

## 2019-02-09 DIAGNOSIS — M1990 Unspecified osteoarthritis, unspecified site: Secondary | ICD-10-CM | POA: Diagnosis not present

## 2019-02-10 DIAGNOSIS — M1990 Unspecified osteoarthritis, unspecified site: Secondary | ICD-10-CM | POA: Diagnosis not present

## 2019-02-11 ENCOUNTER — Inpatient Hospital Stay: Payer: Medicaid Other

## 2019-02-11 DIAGNOSIS — M1990 Unspecified osteoarthritis, unspecified site: Secondary | ICD-10-CM | POA: Diagnosis not present

## 2019-02-12 DIAGNOSIS — M1990 Unspecified osteoarthritis, unspecified site: Secondary | ICD-10-CM | POA: Diagnosis not present

## 2019-02-15 DIAGNOSIS — M1990 Unspecified osteoarthritis, unspecified site: Secondary | ICD-10-CM | POA: Diagnosis not present

## 2019-02-16 DIAGNOSIS — M1990 Unspecified osteoarthritis, unspecified site: Secondary | ICD-10-CM | POA: Diagnosis not present

## 2019-02-17 DIAGNOSIS — M1990 Unspecified osteoarthritis, unspecified site: Secondary | ICD-10-CM | POA: Diagnosis not present

## 2019-02-18 ENCOUNTER — Other Ambulatory Visit: Payer: Self-pay

## 2019-02-18 ENCOUNTER — Inpatient Hospital Stay: Payer: Medicaid Other

## 2019-02-18 ENCOUNTER — Encounter: Payer: Self-pay | Admitting: Hematology

## 2019-02-18 ENCOUNTER — Other Ambulatory Visit: Payer: Self-pay | Admitting: Hematology

## 2019-02-18 ENCOUNTER — Inpatient Hospital Stay: Payer: Medicaid Other | Attending: Hematology

## 2019-02-18 ENCOUNTER — Inpatient Hospital Stay (HOSPITAL_BASED_OUTPATIENT_CLINIC_OR_DEPARTMENT_OTHER): Payer: Medicaid Other | Admitting: Hematology

## 2019-02-18 VITALS — BP 156/92 | HR 75 | Temp 98.8°F | Resp 20

## 2019-02-18 DIAGNOSIS — D500 Iron deficiency anemia secondary to blood loss (chronic): Secondary | ICD-10-CM

## 2019-02-18 DIAGNOSIS — Z95828 Presence of other vascular implants and grafts: Secondary | ICD-10-CM

## 2019-02-18 DIAGNOSIS — I780 Hereditary hemorrhagic telangiectasia: Secondary | ICD-10-CM

## 2019-02-18 DIAGNOSIS — Z79899 Other long term (current) drug therapy: Secondary | ICD-10-CM | POA: Diagnosis not present

## 2019-02-18 DIAGNOSIS — M1990 Unspecified osteoarthritis, unspecified site: Secondary | ICD-10-CM | POA: Diagnosis not present

## 2019-02-18 DIAGNOSIS — Z5112 Encounter for antineoplastic immunotherapy: Secondary | ICD-10-CM | POA: Diagnosis not present

## 2019-02-18 LAB — CBC WITH DIFFERENTIAL (CANCER CENTER ONLY)
Abs Immature Granulocytes: 0.03 10*3/uL (ref 0.00–0.07)
Basophils Absolute: 0 10*3/uL (ref 0.0–0.1)
Basophils Relative: 0 %
Eosinophils Absolute: 0.1 10*3/uL (ref 0.0–0.5)
Eosinophils Relative: 1 %
HCT: 26.5 % — ABNORMAL LOW (ref 36.0–46.0)
Hemoglobin: 7.8 g/dL — ABNORMAL LOW (ref 12.0–15.0)
Immature Granulocytes: 0 %
Lymphocytes Relative: 18 %
Lymphs Abs: 1.6 10*3/uL (ref 0.7–4.0)
MCH: 23.7 pg — ABNORMAL LOW (ref 26.0–34.0)
MCHC: 29.4 g/dL — ABNORMAL LOW (ref 30.0–36.0)
MCV: 80.5 fL (ref 80.0–100.0)
Monocytes Absolute: 0.5 10*3/uL (ref 0.1–1.0)
Monocytes Relative: 6 %
Neutro Abs: 6.5 10*3/uL (ref 1.7–7.7)
Neutrophils Relative %: 75 %
Platelet Count: 226 10*3/uL (ref 150–400)
RBC: 3.29 MIL/uL — ABNORMAL LOW (ref 3.87–5.11)
RDW: 20 % — ABNORMAL HIGH (ref 11.5–15.5)
WBC Count: 8.6 10*3/uL (ref 4.0–10.5)
nRBC: 0 % (ref 0.0–0.2)

## 2019-02-18 LAB — CMP (CANCER CENTER ONLY)
ALT: 7 U/L (ref 0–44)
AST: 11 U/L — ABNORMAL LOW (ref 15–41)
Albumin: 3.4 g/dL — ABNORMAL LOW (ref 3.5–5.0)
Alkaline Phosphatase: 88 U/L (ref 38–126)
Anion gap: 11 (ref 5–15)
BUN: 9 mg/dL (ref 6–20)
CO2: 23 mmol/L (ref 22–32)
Calcium: 8.8 mg/dL — ABNORMAL LOW (ref 8.9–10.3)
Chloride: 103 mmol/L (ref 98–111)
Creatinine: 0.97 mg/dL (ref 0.44–1.00)
GFR, Est AFR Am: >60 mL/min (ref >60)
GFR, Estimated: >60 mL/min (ref >60)
Glucose, Bld: 295 mg/dL — ABNORMAL HIGH (ref 70–99)
Potassium: 3.4 mmol/L — ABNORMAL LOW (ref 3.5–5.1)
Sodium: 137 mmol/L (ref 135–145)
Total Bilirubin: 0.2 mg/dL — ABNORMAL LOW (ref 0.3–1.2)
Total Protein: 7.3 g/dL (ref 6.5–8.1)

## 2019-02-18 LAB — RETICULOCYTES
Immature Retic Fract: 30.1 % — ABNORMAL HIGH (ref 2.3–15.9)
RBC.: 3.28 MIL/uL — ABNORMAL LOW (ref 3.87–5.11)
Retic Count, Absolute: 67.2 10*3/uL (ref 19.0–186.0)
Retic Ct Pct: 2.1 % (ref 0.4–3.1)

## 2019-02-18 LAB — IRON AND TIBC
Iron: 20 ug/dL — ABNORMAL LOW (ref 41–142)
Saturation Ratios: 7 % — ABNORMAL LOW (ref 21–57)
TIBC: 268 ug/dL (ref 236–444)
UIBC: 248 ug/dL (ref 120–384)

## 2019-02-18 LAB — PREPARE RBC (CROSSMATCH)

## 2019-02-18 LAB — FERRITIN: Ferritin: 27 ng/mL (ref 11–307)

## 2019-02-18 MED ORDER — SODIUM CHLORIDE 0.9 % IV SOLN
5.0000 mg/kg | Freq: Once | INTRAVENOUS | Status: AC
  Administered 2019-02-18: 600 mg via INTRAVENOUS
  Filled 2019-02-18: qty 8

## 2019-02-18 MED ORDER — DIPHENHYDRAMINE HCL 25 MG PO CAPS
25.0000 mg | ORAL_CAPSULE | Freq: Once | ORAL | Status: AC
  Administered 2019-02-18: 25 mg via ORAL

## 2019-02-18 MED ORDER — HEPARIN SOD (PORK) LOCK FLUSH 100 UNIT/ML IV SOLN
500.0000 [IU] | Freq: Once | INTRAVENOUS | Status: DC | PRN
  Filled 2019-02-18: qty 5

## 2019-02-18 MED ORDER — SODIUM CHLORIDE 0.9% FLUSH
10.0000 mL | Freq: Once | INTRAVENOUS | Status: DC
  Filled 2019-02-18: qty 10

## 2019-02-18 MED ORDER — METFORMIN HCL 500 MG PO TABS: 500.0000 mg | ORAL_TABLET | Freq: Two times a day (BID) | ORAL | 0 refills | Status: DC

## 2019-02-18 MED ORDER — SODIUM CHLORIDE 0.9% IV SOLUTION
250.0000 mL | Freq: Once | INTRAVENOUS | Status: AC
  Administered 2019-02-18: 250 mL via INTRAVENOUS
  Filled 2019-02-18: qty 250

## 2019-02-18 MED ORDER — SODIUM CHLORIDE 0.9 % IV SOLN
125.0000 mg | Freq: Once | INTRAVENOUS | Status: AC
  Administered 2019-02-18: 125 mg via INTRAVENOUS
  Filled 2019-02-18: qty 10

## 2019-02-18 MED ORDER — ACETAMINOPHEN 325 MG PO TABS
ORAL_TABLET | ORAL | Status: AC
  Filled 2019-02-18: qty 2

## 2019-02-18 MED ORDER — ACETAMINOPHEN 325 MG PO TABS
650.0000 mg | ORAL_TABLET | Freq: Once | ORAL | Status: AC
  Administered 2019-02-18: 650 mg via ORAL

## 2019-02-18 MED ORDER — SODIUM CHLORIDE 0.9 % IV SOLN
Freq: Once | INTRAVENOUS | Status: AC
  Administered 2019-02-18: 10:00:00 via INTRAVENOUS
  Filled 2019-02-18: qty 250

## 2019-02-18 MED ORDER — DIPHENHYDRAMINE HCL 25 MG PO CAPS
ORAL_CAPSULE | ORAL | Status: AC
  Filled 2019-02-18: qty 1

## 2019-02-18 NOTE — Progress Notes (Signed)
02/18/19  Give Noah Charon today due to missed appointment 10/29 and ok to treat with reported nose bleeds.  T.O. Dr Lavonda Jumbo, PharmD

## 2019-02-18 NOTE — Patient Instructions (Addendum)
Nose bleeds- Dr Burr Medico said to take your Amicar tonight and tomorrow for nosebleeds.  Nosebleed  A nosebleed is when blood comes out of the nose. Nosebleeds are common. They are usually not a sign of a serious medical problem. Follow these instructions at home: When you have a nosebleed:  Sit down.  Tilt your head a little forward.  Follow these steps: 1. Pinch your nose with a clean towel or tissue. 2. Keep pinching your nose for 10 minutes. Do not let go. 3. After 10 minutes, let go of your nose. 4. If there is still bleeding, do these steps again. Keep doing these steps until the bleeding stops.  Do not put things in your nose to stop the bleeding.  Try not to lie down or put your head back.  Use a nose spray decongestant as told by your doctor.  Do not use petroleum jelly or mineral oil in your nose. These things can get into your lungs. After a nosebleed:  Try not to blow your nose or sniffle for several hours.  Try not to strain, lift, or bend at the waist for several days.  Use saline spray or a humidifier as told by your doctor.  Aspirin and blood-thinning medicines make bleeding more likely. If you take these medicines, ask your doctor if you should stop taking them, or if you should change how much you take. Do not stop taking the medicine unless your doctor tells you to. Contact a doctor if:  You have a fever.  You get nosebleeds often.  You are getting nosebleeds more often than usual.  You bruise very easily.  You have something stuck in your nose.  You have bleeding in your mouth.  You throw up (vomit) or cough up brown material.  You get a nosebleed after you start a new medicine. Get help right away if:  You have a nosebleed after you fall or hurt your head.  Your nosebleed does not go away after 20 minutes.  You feel dizzy or weak.  You have unusual bleeding from other parts of your body.  You have unusual bruising on other parts of your  body.  You get sweaty.  You throw up blood. Summary  Nosebleeds are common. They are usually not a sign of a serious medical problem.  When you have a nosebleed, sit down and tilt your head a little forward. Pinch your nose with a clean tissue.  After the bleeding stops, try not to blow your nose or sniffle for several hours. This information is not intended to replace advice given to you by your health care provider. Make sure you discuss any questions you have with your health care provider. Document Released: 01/09/2008 Document Revised: 03/14/2017 Document Reviewed: 07/12/2016 Elsevier Patient Education  2020 [facility] Discharge Instructions for Patients Receiving Chemotherapy  Today you received the following chemotherapy agents Avastin  To help prevent nausea and vomiting after your treatment, we encourage you to take your nausea medication as directed.  If you develop nausea and vomiting that is not controlled by your nausea medication, call the clinic.   BELOW ARE SYMPTOMS THAT SHOULD BE REPORTED IMMEDIATELY:  *FEVER GREATER THAN 100.5 F  *CHILLS WITH OR WITHOUT FEVER  NAUSEA AND VOMITING THAT IS NOT CONTROLLED WITH YOUR NAUSEA MEDICATION  *UNUSUAL SHORTNESS OF BREATH  *UNUSUAL BRUISING OR BLEEDING  TENDERNESS IN MOUTH AND THROAT WITH OR WITHOUT PRESENCE OF ULCERS  *  URINARY PROBLEMS  *BOWEL PROBLEMS  UNUSUAL RASH Items with * indicate a potential emergency and should be followed up as soon as possible.  Feel free to call the clinic should you have any questions or concerns. The clinic phone number is [phone].  Please show the [facility] at check-in to the Emergency Department and triage nurse.  Sodium Ferric Gluconate Complex injection What is this medicine? SODIUM FERRIC GLUCONATE COMPLEX (SOE dee um FER ik GLOO koe nate KOM pleks) is an iron replacement. It is used with epoetin therapy to treat low iron  levels in patients who are receiving hemodialysis. This medicine may be used for other purposes; ask your health care provider or pharmacist if you have questions. COMMON BRAND NAME(S): Ferrlecit, Nulecit What should I tell my health care provider before I take this medicine? They need to know if you have any of the following conditions:  anemia that is not from iron deficiency  high levels of iron in the body  an unusual or allergic reaction to iron, benzyl alcohol, other medicines, foods, dyes, or preservatives  pregnant or are trying to become pregnant  breast-feeding How should I use this medicine? This medicine is for infusion into a vein. It is given by a health care professional in a hospital or clinic setting. Talk to your pediatrician regarding the use of this medicine in children. While this drug may be prescribed for children as young as 77 years old for selected conditions, precautions do apply. Overdosage: If you think you have taken too much of this medicine contact a poison control center or emergency room at once. NOTE: This medicine is only for you. Do not share this medicine with others. What if I miss a dose? It is important not to miss your dose. Call your doctor or health care professional if you are unable to keep an appointment. What may interact with this medicine? Do not take this medicine with any of the following medications:  deferoxamine  dimercaprol  other iron products This medicine may also interact with the following medications:  chloramphenicol  deferasirox  medicine for blood pressure like enalapril This list may not describe all possible interactions. Give your health care provider a list of all the medicines, herbs, non-prescription drugs, or dietary supplements you use. Also tell them if you smoke, drink alcohol, or use illegal drugs. Some items may interact with your medicine. What should I watch for while using this medicine? Your  condition will be monitored carefully while you are receiving this medicine. Visit your doctor for check-ups as directed. What side effects may I notice from receiving this medicine? Side effects that you should report to your doctor or health care professional as soon as possible:  allergic reactions like skin rash, itching or hives, swelling of the face, lips, or tongue  breathing problems  changes in hearing  changes in vision  chills, flushing, or sweating  fast, irregular heartbeat  feeling faint or lightheaded, falls  fever, flu-like symptoms  high or low blood pressure  pain, tingling, numbness in the hands or feet  severe pain in the chest, back, flanks, or groin  swelling of the ankles, feet, hands  trouble passing urine or change in the amount of urine  unusually weak or tired Side effects that usually do not require medical attention (report to your doctor or health care professional if they continue or are bothersome):  cramps  dark colored stools  diarrhea  headache  nausea, vomiting  stomach upset This list may not describe all possible side effects. Call your doctor for medical advice about side effects. You may report side effects to FDA at 1-800-FDA-1088. Where should I keep my medicine? This drug is given in a hospital or clinic and will not be stored at home. NOTE: This sheet is a summary. It may not cover all possible information. If you have questions about this medicine, talk to your doctor, pharmacist, or health care provider.  2020 Elsevier/Gold Standard (2007-12-02 15:58:57)    Blood Transfusion, Adult, Care After This sheet gives you information about how to care for yourself after your procedure. Your doctor may also give you more specific instructions. If you have problems or questions, contact your doctor. Follow these instructions at home:   Take over-the-counter and prescription medicines only as told by your doctor.  Go back  to your normal activities as told by your doctor.  Follow instructions from your doctor about how to take care of the area where an IV tube was put into your vein (insertion site). Make sure you: ? Wash your hands with soap and water before you change your bandage (dressing). If there is no soap and water, use hand sanitizer. ? Change your bandage as told by your doctor.  Check your IV insertion site every day for signs of infection. Check for: ? More redness, swelling, or pain. ? More fluid or blood. ? Warmth. ? Pus or a bad smell. Contact a doctor if:  You have more redness, swelling, or pain around the IV insertion site.  You have more fluid or blood coming from the IV insertion site.  Your IV insertion site feels warm to the touch.  You have pus or a bad smell coming from the IV insertion site.  Your pee (urine) turns pink, red, or brown.  You feel weak after doing your normal activities. Get help right away if:  You have signs of a serious allergic or body defense (immune) system reaction, including: ? Itchiness. ? Hives. ? Trouble breathing. ? Anxiety. ? Pain in your chest or lower back. ? Fever, flushing, and chills. ? Fast pulse. ? Rash. ? Watery poop (diarrhea). ? Throwing up (vomiting). ? Dark pee. ? Serious headache. ? Dizziness. ? Stiff neck. ? Yellow color in your face or the white parts of your eyes (jaundice). Summary  After a blood transfusion, return to your normal activities as told by your doctor.  Every day, check for signs of infection where the IV tube was put into your vein.  Some signs of infection are warm skin, more redness and pain, more fluid or blood, and pus or a bad smell where the needle went in.  Contact your doctor if you feel weak or have any unusual symptoms. This information is not intended to replace advice given to you by your health care provider. Make sure you discuss any questions you have with your health care  provider. Document Released: 04/22/2014 Document Revised: 08/06/2017 Document Reviewed: 11/24/2015 Elsevier Patient Education  2020 [employer].

## 2019-02-18 NOTE — Progress Notes (Signed)
Nose bleed -Pt had second nosebleed during blood transfusion that lasted 15 minutes . Pt instructed to take Amicar tonight and tomorrow per Dr Ernestina Penna instructions.

## 2019-02-18 NOTE — Progress Notes (Signed)
[facility]   [phone] [phone]   Clinic Follow up Note   Patient Care Team: [name], MD as PCP - General (Internal Medicine)  Date of Service:  02/18/2019  CHIEF COMPLAINT: F/uof HHT andAnemia   PREVIOUS THERAPY:  -Multiple EGD, small bowel enteroscope, C-scope with APC -Oral and iv amicar were given during hospital stay, no effect -IR embolization ofof the left gastric and short gastric artery branch vessels without evidence of residual flow in the Dieulafoy lesionon 12/10/17 -Avastin on 8/2 and 8/30 at [facility]; starting 12/26/17 continue 5mg /kg q2 weeksuntil 02/20/18, restarted on 04/03/2018  CURRENT THERAPY:  -Tamoxifen started in 10/2017 for HHTstopped 12/10/18 due toDVT -Blood transfusion for severe anemia secondary to GI bleedingas needed -ivferric gluconate every1-2 weeks as needed with ferritin goal 100-200 -RestartedAvastin q2weeks on 04/03/18 -Amicar 1g bidstarting4/06/2018. Reduced to only as needed on 12/10/18 due toDVT  INTERVAL HISTORY:  GLADENE House is here for a follow up. She notes she has been feeling tired the last 2 weeks. She notes she forget her avastin treatment last week. She notes she has been having epistaxis for the past 2 weeks. One which lasted 30 minutes. She denies GI bleeding lately. She notes she has not been doing as much at home. She notes her feet are sore and feels she should see [facility] for evaluation. She notes she has not been taking anything for DM as she was diagnosed with DM. She notes she does urinate often and fel thirsty. She plans to see her PCP next week.    REVIEW OF SYSTEMS:   Constitutional: Denies fevers, chills or abnormal weight loss Eyes: Denies blurriness of vision Ears, nose, mouth, throat, and face: Denies mucositis or sore throat (+) Epistaxis  Respiratory: Denies cough, dyspnea or wheezes Cardiovascular: Denies palpitation, chest  discomfort or lower extremity swelling Gastrointestinal:  Denies nausea, heartburn or change in bowel habits Skin: Denies abnormal skin rashes (+) Foot calluses Lymphatics: Denies new lymphadenopathy or easy bruising Neurological:Denies numbness, tingling or new weaknesses Behavioral/Psych: Mood is stable, no new changes  All other systems were reviewed with the patient and are negative.  MEDICAL HISTORY:  Past Medical History:  Diagnosis Date  . Anxiety   . Arthritis    knnes,back  . GERD (gastroesophageal reflux disease)   . 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 History of swelling of feet   . Hyperlipidemia   . Hypertension   . Major depressive disorder, recurrent episode ([place]) 06/05/2015  . Obesity   . Snores     SURGICAL HISTORY: Past Surgical History:  Procedure Laterality Date  . ABDOMINAL HYSTERECTOMY    . CARPAL TUNNEL RELEASE  05/13/2011   Procedure: CARPAL TUNNEL RELEASE;  Surgeon: Nita Sells, MD;  Location: [place];  Service: Orthopedics;  Laterality: Left;  . COLONOSCOPY WITH PROPOFOL N/A 04/28/2014   Procedure: COLONOSCOPY WITH PROPOFOL;  Surgeon: Cleotis Nipper, MD;  Location: [facility] ENDOSCOPY;  Service: Endoscopy;  Laterality: N/A;  . DG TOES*L*  2/10   rt  . DILATION AND CURETTAGE OF UTERUS    . ENTEROSCOPY N/A 10/17/2017   Procedure: ENTEROSCOPY;  Surgeon: Otis Brace, MD;  Location: [place] ENDOSCOPY;  Service: Gastroenterology;  Laterality: N/A;  . ESOPHAGOGASTRODUODENOSCOPY N/A 04/10/2014   Procedure: ESOPHAGOGASTRODUODENOSCOPY (EGD);  Surgeon: [name], MD;  Location: [facility] ENDOSCOPY;  Service: Endoscopy;  Laterality: N/A;  . ESOPHAGOGASTRODUODENOSCOPY N/A 05/10/2017   Procedure: ESOPHAGOGASTRODUODENOSCOPY (EGD);  Surgeon:  Ronald Lobo, MD;  Location: [facility] ENDOSCOPY;  Service: Endoscopy;  Laterality: N/A;  . ESOPHAGOGASTRODUODENOSCOPY N/A 09/22/2017   Procedure: ESOPHAGOGASTRODUODENOSCOPY (EGD);  Surgeon: Clarene Essex, MD;  Location: [place];  Service: Endoscopy;  Laterality: N/A;  bedside  . ESOPHAGOGASTRODUODENOSCOPY (EGD) WITH PROPOFOL N/A 04/27/2014   Procedure: ESOPHAGOGASTRODUODENOSCOPY (EGD) WITH PROPOFOL;  Surgeon: Cleotis Nipper, MD;  Location: [place];  Service: Endoscopy;  Laterality: N/A;  possible apc  . ESOPHAGOGASTRODUODENOSCOPY (EGD) WITH PROPOFOL N/A 09/30/2017   Procedure: ESOPHAGOGASTRODUODENOSCOPY (EGD) WITH PROPOFOL;  Surgeon: [name], MD;  Location: [place];  Service: Gastroenterology;  Laterality: N/A;  . ESOPHAGOGASTRODUODENOSCOPY (EGD) WITH PROPOFOL N/A 10/01/2017   Procedure: ESOPHAGOGASTRODUODENOSCOPY (EGD) WITH PROPOFOL;  Surgeon: [name], MD;  Location: [place];  Service: Gastroenterology;  Laterality: N/A;  . ESOPHAGOGASTRODUODENOSCOPY (EGD) WITH PROPOFOL N/A 10/08/2017   Procedure: ESOPHAGOGASTRODUODENOSCOPY (EGD) WITH PROPOFOL;  Surgeon: Otis Brace, MD;  Location: [facility] ENDOSCOPY;  Service: Gastroenterology;  Laterality: N/A;  . ESOPHAGOGASTRODUODENOSCOPY (EGD) WITH PROPOFOL N/A 10/17/2017   Procedure: ESOPHAGOGASTRODUODENOSCOPY (EGD) WITH PROPOFOL;  Surgeon: Otis Brace, MD;  Location: [facility] ENDOSCOPY;  Service: Gastroenterology;  Laterality: N/A;  . ESOPHAGOGASTRODUODENOSCOPY (EGD) WITH PROPOFOL N/A 10/19/2017   Procedure: ESOPHAGOGASTRODUODENOSCOPY (EGD) WITH PROPOFOL;  Surgeon: Otis Brace, MD;  Location: [facility] ENDOSCOPY;  Service: Gastroenterology;  Laterality: N/A;  . ESOPHAGOGASTRODUODENOSCOPY (EGD) WITH PROPOFOL N/A 12/04/2018   Procedure: ESOPHAGOGASTRODUODENOSCOPY (EGD) WITH PROPOFOL;  Surgeon: Wilford Corner, MD;  Location: [facility] ENDOSCOPY;  Service: Endoscopy;  Laterality: N/A;  . GIVENS CAPSULE STUDY N/A 10/02/2017   Procedure: GIVENS CAPSULE STUDY;  Surgeon: [name], MD;  Location: [place];  Service: Gastroenterology;  Laterality: N/A;  . GIVENS CAPSULE STUDY N/A 10/08/2017   Procedure: GIVENS CAPSULE STUDY;  Surgeon:  Otis Brace, MD;  Location: [place];  Service: Gastroenterology;  Laterality: N/A;  endoscopic placement of capsule  . HEMOSTASIS CLIP PLACEMENT  12/04/2018   Procedure: HEMOSTASIS CLIP PLACEMENT;  Surgeon: Wilford Corner, MD;  Location: [facility] ENDOSCOPY;  Service: Endoscopy;;  . HOT HEMOSTASIS N/A 04/27/2014   Procedure: HOT HEMOSTASIS (ARGON PLASMA COAGULATION/BICAP);  Surgeon: Cleotis Nipper, MD;  Location: [facility] ENDOSCOPY;  Service: Endoscopy;  Laterality: N/A;  . HOT HEMOSTASIS N/A 09/30/2017   Procedure: HOT HEMOSTASIS (ARGON PLASMA COAGULATION/BICAP);  Surgeon: [name], MD;  Location: [place];  Service: Gastroenterology;  Laterality: N/A;  . HOT HEMOSTASIS N/A 10/01/2017   Procedure: HOT HEMOSTASIS (ARGON PLASMA COAGULATION/BICAP);  Surgeon: [name], MD;  Location: [place];  Service: Gastroenterology;  Laterality: N/A;  . HOT HEMOSTASIS N/A 10/17/2017   Procedure: HOT HEMOSTASIS (ARGON PLASMA COAGULATION/BICAP);  Surgeon: Otis Brace, MD;  Location: [facility] ENDOSCOPY;  Service: Gastroenterology;  Laterality: N/A;  . HOT HEMOSTASIS N/A 10/19/2017   Procedure: HOT HEMOSTASIS (ARGON PLASMA COAGULATION/BICAP);  Surgeon: Otis Brace, MD;  Location: [facility] ENDOSCOPY;  Service: Gastroenterology;  Laterality: N/A;  . IR IMAGING GUIDED PORT INSERTION  07/08/2018  . L shoulder Surgery  2011  . SUBMUCOSAL INJECTION  09/22/2017   Procedure: SUBMUCOSAL INJECTION;  Surgeon: Clarene Essex, MD;  Location: [facility] ENDOSCOPY;  Service: Endoscopy;;  . SUBMUCOSAL INJECTION  12/04/2018   Procedure: SUBMUCOSAL INJECTION;  Surgeon: Wilford Corner, MD;  Location: [facility] ENDOSCOPY;  Service: Endoscopy;;    I have reviewed the social history and family history with the patient and they are unchanged from previous note.  ALLERGIES:  is allergic to feraheme [ferumoxytol]; nsaids; tomato; and wasp venom.  MEDICATIONS:  Current Outpatient Medications  Medication Sig Dispense Refill  . albuterol  (  PROVENTIL HFA;VENTOLIN HFA) 108 (90 Base) MCG/ACT inhaler Inhale 1-2 puffs into the lungs every 6 (six) hours as needed for wheezing or shortness of breath. 1 Inhaler 3  . ALPRAZolam (XANAX) 0.5 MG tablet TAKE 1 TABLET BY MOUTH 2 TIMES A DAY AS NEEDED FOR ANXIETY 60 tablet 0  . Aminocaproic Acid (AMICAR) 1000 MG TABS Take 1 tablet (1,000 mg total) by mouth 2 (two) times daily. 60 each 3  . amLODipine (NORVASC) 10 MG tablet TAKE 1 TABLET(10 MG) BY MOUTH DAILY 90 tablet 0  . atorvastatin (LIPITOR) 80 MG tablet TAKE 1 TABLET(80 MG) BY MOUTH DAILY (Patient taking differently: Take 80 mg by mouth daily. ) 30 tablet 3  . diphenhydramine-acetaminophen (TYLENOL PM) 25-500 MG TABS tablet Take 1 tablet by mouth at bedtime as needed (sleep).    [name] guaifenesin (ROBITUSSIN) 100 MG/5ML syrup Take 200 mg by mouth 3 (three) times daily as needed for cough or congestion.    . hydrochlorothiazide (HYDRODIURIL) 12.5 MG tablet TAKE 1 TABLET(12.5 MG) BY MOUTH DAILY (Patient taking differently: Take 12.5 mg by mouth daily. ) 90 tablet 0  . lidocaine-prilocaine (EMLA) cream Apply 1 application topically as needed. 30 g 0  . pantoprazole (PROTONIX) 40 MG tablet Take 1 tablet (40 mg total) by mouth 2 (two) times daily. 60 tablet 5  . potassium chloride (KLOR-CON) 20 MEQ packet Take 20 mEq by mouth daily. 15 packet 0  . prochlorperazine (COMPAZINE) 10 MG tablet Take 1 tablet (10 mg total) by mouth every 6 (six) hours as needed for nausea or vomiting. 30 tablet 1  . sertraline (ZOLOFT) 50 MG tablet Take 1 tablet (50 mg total) by mouth daily. 30 tablet 2  . traMADol (ULTRAM) 50 MG tablet Take 1 tablet (50 mg total) by mouth every 6 (six) hours as needed. 12 tablet 0   No current facility-administered medications for this visit.    Facility-Administered Medications Ordered in Other Visits  Medication Dose Route Frequency Provider Last Rate Last Dose  . 0.9 %  sodium chloride infusion   Intravenous Once Truitt Merle, MD      .  heparin lock flush 100 unit/mL  500 Units Intracatheter Once PRN Truitt Merle, MD      . sodium chloride flush (NS) 0.9 % injection 10 mL  10 mL Intracatheter Once Truitt Merle, MD        PHYSICAL EXAMINATION: ECOG PERFORMANCE STATUS: 2 - Symptomatic, <50% confined to bed  There were no vitals filed for this visit. There were no vitals filed for this visit.  GENERAL:alert, no distress and comfortable SKIN: skin color, texture, turgor are normal, no rashes or significant lesions EYES: normal, Conjunctiva are pink and non-injected, sclera clear  NECK: supple, thyroid normal size, non-tender, without nodularity LYMPH:  no palpable lymphadenopathy in the cervical, axillary  LUNGS: clear to auscultation and percussion with normal breathing effort HEART: regular rate & rhythm and no murmurs and no lower extremity edema ABDOMEN:abdomen soft, non-tender and normal bowel sounds Musculoskeletal:no cyanosis of digits and no clubbing  NEURO: alert & oriented x 3 with fluent speech, no focal motor/sensory deficits  LABORATORY DATA:  I have reviewed the data as listed CBC Latest Ref Rng & Units 02/18/2019 01/28/2019 12/31/2018  WBC 4.0 - 10.5 K/uL 8.6 12.2(H) 7.4  Hemoglobin 12.0 - 15.0 g/dL 7.8(L) 8.8(L) 9.3(L)  Hematocrit 36.0 - 46.0 % 26.5(L) 29.3(L) 30.9(L)  Platelets 150 - 400 K/uL 226 283 206     CMP Latest Ref Rng &  Units 02/18/2019 01/28/2019 12/31/2018  Glucose 70 - 99 mg/dL 295(H) 96 140(H)  BUN 6 - 20 mg/dL 9 10 8   Creatinine 0.44 - 1.00 mg/dL 0.97 0.97 0.98  Sodium 135 - 145 mmol/L 137 141 142  Potassium 3.5 - 5.1 mmol/L 3.4(L) 3.8 3.2(L)  Chloride 98 - 111 mmol/L 103 105 103  CO2 22 - 32 mmol/L 23 26 29   Calcium 8.9 - 10.3 mg/dL 8.8(L) 8.9 8.5(L)  Total Protein 6.5 - 8.1 g/dL 7.3 7.5 6.6  Total Bilirubin 0.3 - 1.2 mg/dL 0.2(L) 0.2(L) 0.3  Alkaline Phos 38 - 126 U/L 88 69 71  AST 15 - 41 U/L 11(L) 16 15  ALT 0 - 44 U/L 7 7 6       RADIOGRAPHIC STUDIES: I have personally reviewed  the radiological images as listed and agreed with the findings in the report. No results found.   ASSESSMENT & PLAN:  Peggy House is a 58 y.o. 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 Extensive workup found the pt to have HHT based on her personal and familyhistoryand recurrentAVM GI bleedings. -Sheis s/pIR embolization of the left Gastric and short gastric artery branchvesselswithout evidence of residual flow in the Dieulafoy lesionon 12/10/2017. -ShereceivedAvastin on 8[facility]2 [facility] 8[facility] and then every 2 weeks9/13/19-11/8/19 -She was treated with Tamoxifen and Amicar for her HHT, but due to recent DVT, tamoxifen was stopped and she only takes Amicar as needed for severe epistaxis.  -She hashad recurrent GI bleedingand severe anemia in 11/2018. She required hospitalizationwith severalblood transfusions, IV iron and IV amicarand cauterization of stomach bleeding.She responded well.  I encouraged her to use Amicar as needed for severe nosebleeding. -Her primary treatment will now be Avastin and IV Iron. However she often misses treatments which lead to severe anemia that require blood transfusion and hospitalizations. I again reviewed with her the need to maintain infusions so her counts do not become dangerously low. She voices good understanding. -She clinically has been very fatigued the last 2 weeks with more epistaxis. No GI bleeding. Labs reviewed, Hg 7.8. Will set up blood transfusion today. She is agreeable.  -Proceed with Avastin and IV  ferric gluconate today -ContinueIVferric gluconateevery 1-2 weekunlessferritin >100, Avastin every 2 weeks. 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 -Shepreviouslynotedshe is no longer takingOral Fergon 1 tab TID -She developed anotherepisodeof severe anemia and required hospitalizationand blood transfusion last week, IV Amicar and IV iron.  -EGDfrom 8/21/20showeda dieulafoy lesion which was clipped and injected, large amount blood found  3. Reactive Leukocytosis and reactive thrombocytosis  -She has mild leukocytosis with dominant neutrophils, likely reactive -Thrombocytosis recently resolved. Leukocytosis intermittent. Continue monitoring  4. 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 -I recommend she continue to follow up with her GI, Dr. Cristina Gong -She had severe GI bleeding which required hospitalization in 11/2018 with several blood transfusions and EGD to cauterize her stomach bleeding.  -I started him on Amicar 07/17/18. Given recent DVT she will only take Amicar for severe epistaxis -Her GI bleeding has resolved currently.But has been having more epistaxis the past  2 weeks.   5. History of right Arm DVT -She had right arm DVT that extended to her right clavicleon 12/07/2018 when she was hospitalized for GI bleeding.It is possibly related to Amicar, tamoxifen and port. -She is not a candidate for anticoagulation due to severe GI bleeding -We stopped Tamoxifen, she will only use Amicar for severe epistaxis. Will maintain Port flushes.   6. Smoking cessation -She was smoking 10 cigarettes a day on average before recent hospitalization -I strongly encouraged her to stop smoking completely, she will try.  7. Hypokalemia -On potassium chloride 20 mEq daily, continue. Will monitor  8. Hyperglycemia, new onset diabetes  -She has not been diagnosed or treated for DM in the past.  -Her BG is 295 today (02/18/19).  -I  advised her to stop eating sweats and reduce her carbohydrate intake.  -I will call in Metformin BID today (02/18/19) to manage this. I reviewed side effects, she is agreeable -She will see her PCP on 11/9. I recommend she have her A1c checked and hyperglycemia monitored.    PLAN: -Hg 7.8 today. Blood transfusion today or 11/7 -I will call in Metformin today  -Proceed with Ferric Gluconate and avastin today  -Lab and IV Ferric gluconate in 1 week -Lab, IV ferric Gluconate and avastin in 2 weeks  -We will continue lab every 2 weeks (if Hg<9.0, will add lab next week) -IV Avastin every 2 weeks, ferricgluconate every 1 to 2 weeks based on her results (hold if ferritin >200 on last lab) -F/u in 4 weeks    No problem-specific Assessment & Plan notes found for this encounter.   No orders of the defined types were placed in this encounter.  All questions were answered. The patient knows to call the clinic with any problems, questions or concerns. No barriers to learning was detected. I spent 20 minutes counseling the patient face to face. The total time spent in the appointment was 25 minutes and more than 50% was on counseling and review of test results     Truitt Merle, MD 02/18/2019   I, Joslyn Devon, am acting as scribe for Truitt Merle, MD.   I have reviewed the above documentation for accuracy and completeness, and I agree with the above.

## 2019-02-18 NOTE — Progress Notes (Signed)
Pt c/o nose bleeds 2-3 x a day lasting 35-40 minutes for past week. Dr Burr Medico notified. Pt to receive PRBC, to be scheduled. Pt reports no s/s GI bleed. No abdominal bloating, no blood in stool or urine, no abdominal pain or discomfort. Pt does report "occasional cramps in her stomach" No emesis with blood. Pt denies any dark, black, or tarry stools.  Sandi Mealy PA to tx area to assess pt. Dr Burr Medico to tx area to assess pt. @1130    @1404  pt experiencing nose bleed from R side, pt leaning forward, holding washcloth under nose, and ice applied to bridge of nose. Dr Burr Medico and Sandi Mealy PA notified. Dr Burr Medico to tx area to assess pt. Will continue to monitor. Pressure applied to R side of nares as well as ice continued.  Bleeding stopped @1417 .

## 2019-02-19 ENCOUNTER — Telehealth: Payer: Self-pay | Admitting: Hematology

## 2019-02-19 DIAGNOSIS — M1990 Unspecified osteoarthritis, unspecified site: Secondary | ICD-10-CM | POA: Diagnosis not present

## 2019-02-19 LAB — TYPE AND SCREEN
ABO/RH(D): A NEG
Antibody Screen: NEGATIVE
Unit division: 0
Unit division: 0

## 2019-02-19 LAB — BPAM RBC
Blood Product Expiration Date: 202011292359
Blood Product Expiration Date: 202011292359
ISSUE DATE / TIME: 202011051310
ISSUE DATE / TIME: 202011051310
Unit Type and Rh: 600
Unit Type and Rh: 600

## 2019-02-19 NOTE — Telephone Encounter (Signed)
No los per 11/5. °

## 2019-02-22 ENCOUNTER — Encounter: Payer: Medicaid Other | Admitting: Internal Medicine

## 2019-02-22 DIAGNOSIS — M1990 Unspecified osteoarthritis, unspecified site: Secondary | ICD-10-CM | POA: Diagnosis not present

## 2019-02-22 NOTE — Progress Notes (Deleted)
CC: Hypertension, MDD, HHT, ***  HPI:  Peggy House is a 58 y.o.  with a PMH listed below presenting for hypertension, MDD, HHT, ***   Hypertension: On amlodipine 10 mg daily and HCTZ 12.5 mg daily. BP today is ***.   MDD: Is on Zoloft 50 mg daily, she reports ***.   HHT: Saw Heme-Onc on 02/18/19, currently on Avastin, tamoxifen, IV ferric gluconate every 1-2 weeks, and amicar 1 g BID, and blood transfusion PRN. Today she reports ***.   Please see A&P for status of the patient's chronic medical conditions  Past Medical History:  Diagnosis Date  . Anxiety   . Arthritis    knnes,back  . GERD (gastroesophageal reflux disease)   . 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 History of swelling of feet   . Hyperlipidemia   . Hypertension   . Major depressive disorder, recurrent episode ([place]) 06/05/2015  . Obesity   . Snores    Review of Systems: Refer to history of present illness and assessment and plans for pertinent review of systems, all others reviewed and negative.  Physical Exam:  There were no vitals filed for this visit. *** Physical Exam  Social History   Socioeconomic History  . Marital status: Single    Spouse name: Not on file  . Number of children: Not on file  . Years of education: Not on file  . Highest education level: Not on file  Occupational History  . Not on file  Social Needs  . Financial resource strain: Not on file  . Food insecurity    Worry: Not on file    Inability: Not on file  . Transportation needs    Medical: Not on file    Non-medical: Not on file  Tobacco Use  . Smoking status: Current Every Day Smoker    Packs/day: 0.50    Years: 30.00    Pack years: 15.00    Types: Cigarettes  . Smokeless tobacco: Former Systems developer    Types: Snuff    Quit date: 52  . Tobacco comment: 1/2 PPD  Substance and Sexual Activity  . Alcohol use: No    Alcohol/week: 0.0 standard drinks  . Drug use: No    Comment: last cocaine-2010  .  Sexual activity: Yes    Birth control/protection: Surgical    Comment: Hysterectomy  Lifestyle  . Physical activity    Days per week: Not on file    Minutes per session: Not on file  . Stress: Not on file  Relationships  . Social Herbalist on phone: Not on file    Gets together: Not on file    Attends religious service: Not on file    Active member of club or organization: Not on file    Attends meetings of clubs or organizations: Not on file    Relationship status: Not on file  . Intimate partner violence    Fear of current or ex partner: Not on file    Emotionally abused: Not on file    Physically abused: Not on file    Forced sexual activity: Not on file  Other Topics Concern  . Not on file  Social History Narrative  . Not on file   *** Family History  Problem Relation Age of Onset  . Cancer Mother        Ovarian  . Ovarian cancer Mother   . Diabetes Mother   . Kidney disease Mother   .  Bleeding Disorder Mother   . Diabetes type II Sister   . Bleeding Disorder Sister   . Diabetes Sister   . Colon cancer Maternal Grandfather   . Crohn's disease Maternal Grandfather   . Stomach cancer Maternal Grandmother   . Kidney disease Son   . Bleeding Disorder Son   . Dysmenorrhea Neg Hx     Assessment & Plan:   See Encounters Tab for problem based charting.  Patient discussed with Dr. Illa Level. Hoffman","Klima","Mullen","Narendra","Raines","Vincent"}

## 2019-02-23 DIAGNOSIS — M1990 Unspecified osteoarthritis, unspecified site: Secondary | ICD-10-CM | POA: Diagnosis not present

## 2019-02-24 DIAGNOSIS — M1990 Unspecified osteoarthritis, unspecified site: Secondary | ICD-10-CM | POA: Diagnosis not present

## 2019-02-25 ENCOUNTER — Inpatient Hospital Stay: Payer: Medicaid Other

## 2019-02-25 ENCOUNTER — Ambulatory Visit: Payer: Medicaid Other | Admitting: Nurse Practitioner

## 2019-02-25 DIAGNOSIS — M1990 Unspecified osteoarthritis, unspecified site: Secondary | ICD-10-CM | POA: Diagnosis not present

## 2019-02-26 ENCOUNTER — Ambulatory Visit: Payer: Medicaid Other | Admitting: Podiatry

## 2019-02-26 ENCOUNTER — Other Ambulatory Visit: Payer: Self-pay

## 2019-02-26 ENCOUNTER — Encounter: Payer: Self-pay | Admitting: Podiatry

## 2019-02-26 DIAGNOSIS — L851 Acquired keratosis [keratoderma] palmaris et plantaris: Secondary | ICD-10-CM | POA: Insufficient documentation

## 2019-02-26 DIAGNOSIS — B351 Tinea unguium: Secondary | ICD-10-CM | POA: Diagnosis not present

## 2019-02-26 DIAGNOSIS — M79675 Pain in left toe(s): Secondary | ICD-10-CM

## 2019-02-26 DIAGNOSIS — M79674 Pain in right toe(s): Secondary | ICD-10-CM

## 2019-02-26 DIAGNOSIS — E1159 Type 2 diabetes mellitus with other circulatory complications: Secondary | ICD-10-CM

## 2019-02-26 DIAGNOSIS — M1990 Unspecified osteoarthritis, unspecified site: Secondary | ICD-10-CM | POA: Diagnosis not present

## 2019-02-26 DIAGNOSIS — E119 Type 2 diabetes mellitus without complications: Secondary | ICD-10-CM | POA: Insufficient documentation

## 2019-02-26 HISTORY — DX: Type 2 diabetes mellitus with other circulatory complications: E11.59

## 2019-02-26 NOTE — Progress Notes (Signed)
Complaint:  Visit Type: Patient presents  to my office for  preventative foot care services.  Patient states" my nails have grown long and thick and become painful to walk and wear shoes".  Patient also says she has thick painful skin on the bottom of both feet.  This thick skin is painful when walking.   Patient has been diagnosed with DM with no foot complications. The patient presents for preventative foot care services.  Podiatric Exam: Vascular: dorsalis pedis and posterior tibial pulses are absent  bilateral. Capillary return is immediate. Temperature gradient is diminished . Skin turgor WNL  Sensorium: Normal Semmes Weinstein monofilament test. Normal tactile sensation bilaterally. Nail Exam: Pt has thick disfigured discolored nails with subungual debris noted bilateral entire nail hallux through fifth toenails Ulcer Exam: There is no evidence of ulcer or pre-ulcerative changes or infection. Orthopedic Exam: Muscle tone and strength are WNL. No limitations in general ROM. No crepitus or effusions noted. Foot type and digits show no abnormalities. Bony prominences are unremarkable. Skin: No Porokeratosis. No infection or ulcers.  Thick painful skin noted on plantar aspect of both feet extending from heels to forefoot. Diagnosis:  Onychomycosis, , Pain in right toe, pain in left toes  Keratoderma.    Treatment & Plan Procedures and Treatment: Consent by patient was obtained for treatment procedures.   Debridement of mycotic and hypertrophic toenails, 1 through 5 bilateral and clearing of subungual debris. No ulceration, no infection noted. Geanie Cooley woith Dr.  March Rummage on this skin condition.  We told her to use revitaderm on her feet and vaseline.  RTC 2 weeks.  The thick skin appears to be coming off but this treatment will hasten the desquamation of plantar callus. Return Visit-Office Procedure: Patient instructed to return to the office for a follow up visit 2 weeks   for continued evaluation  and treatment.    Gardiner Barefoot DPM

## 2019-03-01 ENCOUNTER — Ambulatory Visit: Payer: Medicaid Other | Admitting: Internal Medicine

## 2019-03-01 ENCOUNTER — Other Ambulatory Visit: Payer: Self-pay

## 2019-03-01 ENCOUNTER — Encounter: Payer: Self-pay | Admitting: Internal Medicine

## 2019-03-01 VITALS — BP 129/70 | HR 75 | Temp 98.0°F | Ht 65.5 in | Wt 275.7 lb

## 2019-03-01 DIAGNOSIS — R05 Cough: Secondary | ICD-10-CM | POA: Diagnosis not present

## 2019-03-01 DIAGNOSIS — F339 Major depressive disorder, recurrent, unspecified: Secondary | ICD-10-CM

## 2019-03-01 DIAGNOSIS — Z79899 Other long term (current) drug therapy: Secondary | ICD-10-CM

## 2019-03-01 DIAGNOSIS — E1159 Type 2 diabetes mellitus with other circulatory complications: Secondary | ICD-10-CM

## 2019-03-01 DIAGNOSIS — F331 Major depressive disorder, recurrent, moderate: Secondary | ICD-10-CM

## 2019-03-01 DIAGNOSIS — G4700 Insomnia, unspecified: Secondary | ICD-10-CM

## 2019-03-01 DIAGNOSIS — Z7984 Long term (current) use of oral hypoglycemic drugs: Secondary | ICD-10-CM

## 2019-03-01 DIAGNOSIS — F17210 Nicotine dependence, cigarettes, uncomplicated: Secondary | ICD-10-CM

## 2019-03-01 DIAGNOSIS — E785 Hyperlipidemia, unspecified: Secondary | ICD-10-CM

## 2019-03-01 DIAGNOSIS — K922 Gastrointestinal hemorrhage, unspecified: Secondary | ICD-10-CM

## 2019-03-01 DIAGNOSIS — R040 Epistaxis: Secondary | ICD-10-CM

## 2019-03-01 DIAGNOSIS — F5101 Primary insomnia: Secondary | ICD-10-CM

## 2019-03-01 DIAGNOSIS — I10 Essential (primary) hypertension: Secondary | ICD-10-CM | POA: Diagnosis not present

## 2019-03-01 DIAGNOSIS — M1990 Unspecified osteoarthritis, unspecified site: Secondary | ICD-10-CM | POA: Diagnosis not present

## 2019-03-01 DIAGNOSIS — R059 Cough, unspecified: Secondary | ICD-10-CM

## 2019-03-01 LAB — POCT GLYCOSYLATED HEMOGLOBIN (HGB A1C): Hemoglobin A1C: 6.8 % — AB (ref 4.0–5.6)

## 2019-03-01 LAB — GLUCOSE, CAPILLARY: Glucose-Capillary: 120 mg/dL — ABNORMAL HIGH (ref 70–99)

## 2019-03-01 MED ORDER — TRAZODONE HCL 50 MG PO TABS: 50.0000 mg | ORAL_TABLET | Freq: Every day | ORAL | 0 refills | Status: DC

## 2019-03-01 MED ORDER — SERTRALINE HCL 100 MG PO TABS: 100.0000 mg | ORAL_TABLET | Freq: Every day | ORAL | 0 refills | Status: DC

## 2019-03-01 MED ORDER — CETIRIZINE HCL 10 MG PO TABS: 10.0000 mg | ORAL_TABLET | Freq: Every day | ORAL | 2 refills | Status: DC

## 2019-03-01 NOTE — Patient Instructions (Addendum)
[name],  It was a pleasure to see you today. Thank you for coming in.   Today we discussed your chronic medical conditions.   In regards to your insomnia, please start taking trazadone nightly. Wean down on your xanax by 0.25 mg per week. Example: take 0.75 mg daily for 1 week, 0.5 mg daily for 1 week, then 0.25 mg daily for 1 week. Please do not just stop the xanax.   For your depression, please increase you sertraline to 100 mg daily. I have placed a referral for [employer].   For your cough, I have placed an order for an chest Xray. Please start taking allergy medications daily.   Please return to clinic in 2 weeks or sooner if needed.   Thank you again for coming in.   [name].D.

## 2019-03-01 NOTE — Progress Notes (Signed)
CC: Hypertension, insomnia, and cough  HPI: [name] is a 58 y.o.  with a PMH listed below presenting for hypertension, insomnia, and cough.    Please see A&P for status of the patient's chronic medical conditions  Past Medical History:  Diagnosis Date  . Anxiety   . Arthritis    knnes,back  . GERD (gastroesophageal reflux disease)   . 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 History of swelling of feet   . Hyperlipidemia   . Hypertension   . Major depressive disorder, recurrent episode ([place]) 06/05/2015  . Obesity   . Snores   . 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  Physical Exam:  Vitals:   03/01/19 1515 03/01/19 1517  BP:  129/70  Pulse:  75  Temp:  98 F (36.7 C)  TempSrc:  Oral  SpO2:  100%  Weight: 275 lb 11.2 oz (125.1 kg)   Height: 5' 5.5" (1.664 m)    Physical Exam  Constitutional: She is oriented to person, place, and time and well-developed, well-nourished, and in no distress.  HENT:  Head: Normocephalic and atraumatic.  Cardiovascular: Normal rate, regular rhythm and normal heart sounds.  Pulmonary/Chest: Effort normal and breath sounds normal. No respiratory distress.  Abdominal: Soft. Bowel sounds are normal. She exhibits no distension.  Musculoskeletal: Normal range of motion.        General: No edema.  Neurological: She is alert and oriented to person, place, and time.  Skin: Skin is warm and dry.  Psychiatric: Mood and affect normal.    Social History   Socioeconomic History  . Marital status: Single    Spouse name: Not on file  . Number of children: Not on file  . Years of education: Not on file  . Highest education level: Not on file  Occupational History  . Not on file  Social Needs  . Financial resource strain: Not on file  . Food insecurity     Worry: Not on file    Inability: Not on file  . Transportation needs    Medical: Not on file    Non-medical: Not on file  Tobacco Use  . Smoking status: Current Every Day Smoker    Packs/day: 0.50    Years: 30.00    Pack years: 15.00    Types: Cigarettes  . Smokeless tobacco: Former Systems developer    Types: Snuff    Quit date: 58  . Tobacco comment: 1/2 PPD  Substance and Sexual Activity  . Alcohol use: No    Alcohol/week: 0.0 standard drinks  . Drug use: No    Comment: last cocaine-2010  . Sexual activity: Yes    Birth control/protection: Surgical    Comment: Hysterectomy  Lifestyle  . Physical activity    Days per week: Not on file    Minutes per session: Not on file  . Stress: Not on file  Relationships  . Social Herbalist on phone: Not on file    Gets together: Not on file    Attends religious service: Not on file    Active member of club or organization: Not on file    Attends meetings of clubs or organizations: Not on file    Relationship status: Not on file  . Intimate partner violence    Fear of current or  ex partner: Not on file    Emotionally abused: Not on file    Physically abused: Not on file    Forced sexual activity: Not on file  Other Topics Concern  . Not on file  Social History Narrative  . Not on file    Family History  Problem Relation Age of Onset  . Cancer Mother        Ovarian  . Ovarian cancer Mother   . Diabetes Mother   . Kidney disease Mother   . Bleeding Disorder Mother   . Diabetes type II Sister   . Bleeding Disorder Sister   . Diabetes Sister   . Colon cancer Maternal Grandfather   . Crohn's disease Maternal Grandfather   . Stomach cancer Maternal Grandmother   . Kidney disease Son   . Bleeding Disorder Son   . Dysmenorrhea Neg Hx     Assessment & Plan:   See Encounters Tab for problem based charting.  Patient discussed with Dr. Lynnae January

## 2019-03-02 ENCOUNTER — Telehealth: Payer: Self-pay | Admitting: Internal Medicine

## 2019-03-02 DIAGNOSIS — M1990 Unspecified osteoarthritis, unspecified site: Secondary | ICD-10-CM | POA: Diagnosis not present

## 2019-03-02 MED ORDER — AMLODIPINE BESYLATE 10 MG PO TABS: ORAL_TABLET | ORAL | 0 refills | Status: DC

## 2019-03-02 MED ORDER — ATORVASTATIN CALCIUM 80 MG PO TABS: ORAL_TABLET | ORAL | 3 refills | Status: DC

## 2019-03-02 MED ORDER — PANTOPRAZOLE SODIUM 40 MG PO TBEC: 40.0000 mg | DELAYED_RELEASE_TABLET | Freq: Two times a day (BID) | ORAL | 5 refills | Status: DC

## 2019-03-02 MED ORDER — HYDROCHLOROTHIAZIDE 12.5 MG PO TABS: ORAL_TABLET | ORAL | 0 refills | Status: DC

## 2019-03-02 NOTE — Telephone Encounter (Signed)
rtc to # listed, rang and rang, no vmail

## 2019-03-02 NOTE — Assessment & Plan Note (Signed)
Currently on amlodipine 10 mg daily and HCTZ 12.5 mg daily. She reports no issues taking her medications. BP today well controlled at 129/70. Continue current regimen.

## 2019-03-02 NOTE — Telephone Encounter (Signed)
[facility] calling to confirm medications and new diagnosis from last office visit.  Please call back.

## 2019-03-02 NOTE — Assessment & Plan Note (Signed)
Patient reports that she has been having a productive cough with clear/green sputum production, and hoarseness for about 1 month. Also endorses some sinus congestion and stuffed up nose. She denies any shortness of breath, fevers, chills, nausea, vomiting, sinus pain, headaches or light headedness. She denies any recent sick contact or recent travel. She has been following with hematology for her history of HHT and last saw them about 11 days ago, her WBC at that time was 8.6. She has a history of pneumonia about 3 months ago that was treated. She is afebrile. On exam she was comfortable appearing on room air, lungs were CTA with normal work of breathing, HEENT exam was unremarkable. Given his recent history of pnuemonia will obtain CXR. This could be related to allergies, she does report a history of this.   -Obtain CXR -Start cetirizine 10 mg daily

## 2019-03-02 NOTE — Assessment & Plan Note (Signed)
She was recently noted to have elevated glucose at her hematology office, she was started on metformin 500 mg BID at that time. A1c today is elevated to 6.8. She denies any issues taking this medication. Has a history of chronic diarrhea.  -Continue metformin 5.8 mg BID

## 2019-03-02 NOTE — Telephone Encounter (Signed)
rtc again, answered, went over visit 11/16 w/ dr Sherry Ruffing Pt needs to see nenikaw., will send a message for scheduling

## 2019-03-02 NOTE — Assessment & Plan Note (Signed)
Patient reports that she has been having a lot of insomnia recently, she has difficulty falling asleep and staying asleep. She will only sleep for about 3-4 hours at a time but then has difficultly going back to bed. She is currently taking Xanax 1 mg nightly to help with this. She was requesting to try a different medication, she has never tried trazodone before. We discussed that she will need to wean down on the xanax. She is agreeable to this plan.   -Start trazadone 50 mg nightly -Wean down on xanax, decrease by 0.25 mg each week

## 2019-03-03 ENCOUNTER — Telehealth: Payer: Self-pay | Admitting: Licensed Clinical Social Worker

## 2019-03-03 ENCOUNTER — Other Ambulatory Visit: Payer: Self-pay

## 2019-03-03 ENCOUNTER — Encounter: Payer: Self-pay | Admitting: Licensed Clinical Social Worker

## 2019-03-03 DIAGNOSIS — M1990 Unspecified osteoarthritis, unspecified site: Secondary | ICD-10-CM | POA: Diagnosis not present

## 2019-03-03 DIAGNOSIS — D500 Iron deficiency anemia secondary to blood loss (chronic): Secondary | ICD-10-CM

## 2019-03-03 DIAGNOSIS — I780 Hereditary hemorrhagic telangiectasia: Secondary | ICD-10-CM

## 2019-03-03 NOTE — Progress Notes (Signed)
Internal Medicine Clinic Attending  Case discussed with Dr. Krienke at the time of the visit.  We reviewed the resident's history and exam and pertinent patient test results.  I agree with the assessment, diagnosis, and plan of care documented in the resident's note.    

## 2019-03-03 NOTE — Telephone Encounter (Signed)
Patient was contacted to establish care. Pt did not answer and a vm was left for the pt.

## 2019-03-04 ENCOUNTER — Inpatient Hospital Stay: Payer: Medicaid Other

## 2019-03-04 ENCOUNTER — Inpatient Hospital Stay: Payer: Medicaid Other | Admitting: Hematology

## 2019-03-04 ENCOUNTER — Telehealth: Payer: Self-pay | Admitting: Hematology

## 2019-03-04 DIAGNOSIS — M1990 Unspecified osteoarthritis, unspecified site: Secondary | ICD-10-CM | POA: Diagnosis not present

## 2019-03-04 NOTE — Telephone Encounter (Signed)
Returned patient's phone call regarding rescheduling missed 11/19 appointment, per providers nurse appointment has been rescheduled to 11/23. Infusion nurse approved of time. Patient is notified.

## 2019-03-04 NOTE — Progress Notes (Signed)
[place]   [phone] [phone]   Clinic Follow up Note   Patient Care Team: [name], MD as PCP - General (Internal Medicine)  Date of Service:  03/08/2019  CHIEF COMPLAINT: F/uof HHT andAnemia   PREVIOUS THERAPY:  -Multiple EGD, small bowel enteroscope, C-scope with APC -Oral and iv amicar were given during hospital stay, no effect -IR embolization ofof the left gastric and short gastric artery branch vessels without evidence of residual flow in the Dieulafoy lesionon 12/10/17 -Avastin on 8/2 and 8/30 at [facility]; starting 12/26/17 continue 5mg /kg q2 weeksuntil 02/20/18, restarted on 04/03/2018  CURRENT THERAPY:  -Tamoxifen started in 10/2017 for HHTstopped 12/10/18 due toDVT -Blood transfusion for severe anemia secondary to GI bleedingas needed -ivferric gluconate every1-2 weeks as neededwith ferritin goal 100-200 -RestartedAvastin q2weeks on 04/03/18 -Amicar 1g bidstarting4/06/2018. Reduced to only as needed on 12/10/18 due toDVT   INTERVAL HISTORY:  Peggy House is here for a follow up of anemia and HHT. She presents to the clinic alone. She notes the last 2 weeks she was not able to come in that early the last 2 weeks. She notes mid afternoons work better for her to make it here. She notes her stool has been brown lately, not black. She notes her epistaxis is stable about once a night. It does stop quickly most of the time. She notes she takes her HTN medication. She notes she may have missed it yesterday and has not taken it yet today as she has not had breakfast.     REVIEW OF SYSTEMS:   Constitutional: Denies fevers, chills or abnormal weight loss Eyes: Denies blurriness of vision Ears, nose, mouth, throat, and face: Denies mucositis or sore throat (+) nightly epistaxis  Respiratory: Denies cough, dyspnea or wheezes Cardiovascular: Denies palpitation, chest discomfort or lower extremity swelling  Gastrointestinal:  Denies nausea, heartburn or change in bowel habits Skin: Denies abnormal skin rashes Lymphatics: Denies new lymphadenopathy or easy bruising Neurological:Denies numbness, tingling or new weaknesses Behavioral/Psych: Mood is stable, no new changes  All other systems were reviewed with the patient and are negative.  MEDICAL HISTORY:  Past Medical History:  Diagnosis Date  . Anxiety   . Arthritis    knnes,back  . GERD (gastroesophageal reflux disease)   . 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 History of swelling of feet   . Hyperlipidemia   . Hypertension   . Major depressive disorder, recurrent episode ([place]) 06/05/2015  . Obesity   . Snores   . 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 ABDOMINAL HYSTERECTOMY    . CARPAL TUNNEL RELEASE  05/13/2011   Procedure: CARPAL TUNNEL RELEASE;  Surgeon: Nita Sells, MD;  Location: [place];  Service: Orthopedics;  Laterality: Left;  . COLONOSCOPY WITH PROPOFOL N/A 04/28/2014   Procedure: COLONOSCOPY WITH PROPOFOL;  Surgeon: Cleotis Nipper, MD;  Location: [facility] ENDOSCOPY;  Service: Endoscopy;  Laterality: N/A;  . DG TOES*L*  2/10   rt  . DILATION AND CURETTAGE OF UTERUS    . ENTEROSCOPY N/A 10/17/2017   Procedure: ENTEROSCOPY;  Surgeon: Otis Brace, MD;  Location: [place] ENDOSCOPY;  Service: Gastroenterology;  Laterality: N/A;  . ESOPHAGOGASTRODUODENOSCOPY N/A 04/10/2014   Procedure: ESOPHAGOGASTRODUODENOSCOPY (EGD);  Surgeon: [name], MD;  Location[facility] ENDOSCOPY;  Service: Endoscopy;  Laterality: N/A;  . ESOPHAGOGASTRODUODENOSCOPY N/A 05/10/2017   Procedure: ESOPHAGOGASTRODUODENOSCOPY (EGD);  Surgeon: Ronald Lobo, MD;  Location: [facility] ENDOSCOPY;  Service: Endoscopy;  Laterality: N/A;  . ESOPHAGOGASTRODUODENOSCOPY N/A 09/22/2017   Procedure: ESOPHAGOGASTRODUODENOSCOPY (EGD);   Surgeon: Clarene Essex, MD;  Location: [place];  Service: Endoscopy;  Laterality: N/A;  bedside  . ESOPHAGOGASTRODUODENOSCOPY (EGD) WITH PROPOFOL N/A 04/27/2014   Procedure: ESOPHAGOGASTRODUODENOSCOPY (EGD) WITH PROPOFOL;  Surgeon: Cleotis Nipper, MD;  Location: [place];  Service: Endoscopy;  Laterality: N/A;  possible apc  . ESOPHAGOGASTRODUODENOSCOPY (EGD) WITH PROPOFOL N/A 09/30/2017   Procedure: ESOPHAGOGASTRODUODENOSCOPY (EGD) WITH PROPOFOL;  Surgeon: [name], MD;  Location: [place];  Service: Gastroenterology;  Laterality: N/A;  . ESOPHAGOGASTRODUODENOSCOPY (EGD) WITH PROPOFOL N/A 10/01/2017   Procedure: ESOPHAGOGASTRODUODENOSCOPY (EGD) WITH PROPOFOL;  Surgeon: [name], MD;  Location: [place];  Service: Gastroenterology;  Laterality: N/A;  . ESOPHAGOGASTRODUODENOSCOPY (EGD) WITH PROPOFOL N/A 10/08/2017   Procedure: ESOPHAGOGASTRODUODENOSCOPY (EGD) WITH PROPOFOL;  Surgeon: Otis Brace, MD;  Location: [facility] ENDOSCOPY;  Service: Gastroenterology;  Laterality: N/A;  . ESOPHAGOGASTRODUODENOSCOPY (EGD) WITH PROPOFOL N/A 10/17/2017   Procedure: ESOPHAGOGASTRODUODENOSCOPY (EGD) WITH PROPOFOL;  Surgeon: Otis Brace, MD;  Location: [facility] ENDOSCOPY;  Service: Gastroenterology;  Laterality: N/A;  . ESOPHAGOGASTRODUODENOSCOPY (EGD) WITH PROPOFOL N/A 10/19/2017   Procedure: ESOPHAGOGASTRODUODENOSCOPY (EGD) WITH PROPOFOL;  Surgeon: Otis Brace, MD;  Location: [facility] ENDOSCOPY;  Service: Gastroenterology;  Laterality: N/A;  . ESOPHAGOGASTRODUODENOSCOPY (EGD) WITH PROPOFOL N/A 12/04/2018   Procedure: ESOPHAGOGASTRODUODENOSCOPY (EGD) WITH PROPOFOL;  Surgeon: Wilford Corner, MD;  Location: [facility] ENDOSCOPY;  Service: Endoscopy;  Laterality: N/A;  . GIVENS CAPSULE STUDY N/A 10/02/2017   Procedure: GIVENS CAPSULE STUDY;  Surgeon: [name], MD;  Location: [place];  Service: Gastroenterology;  Laterality: N/A;  . GIVENS CAPSULE STUDY N/A 10/08/2017   Procedure: GIVENS CAPSULE STUDY;   Surgeon: Otis Brace, MD;  Location: [place];  Service: Gastroenterology;  Laterality: N/A;  endoscopic placement of capsule  . HEMOSTASIS CLIP PLACEMENT  12/04/2018   Procedure: HEMOSTASIS CLIP PLACEMENT;  Surgeon: Wilford Corner, MD;  Location: [facility] ENDOSCOPY;  Service: Endoscopy;;  . HOT HEMOSTASIS N/A 04/27/2014   Procedure: HOT HEMOSTASIS (ARGON PLASMA COAGULATION/BICAP);  Surgeon: Cleotis Nipper, MD;  Location: [facility] ENDOSCOPY;  Service: Endoscopy;  Laterality: N/A;  . HOT HEMOSTASIS N/A 09/30/2017   Procedure: HOT HEMOSTASIS (ARGON PLASMA COAGULATION/BICAP);  Surgeon: [name], MD;  Location: [place];  Service: Gastroenterology;  Laterality: N/A;  . HOT HEMOSTASIS N/A 10/01/2017   Procedure: HOT HEMOSTASIS (ARGON PLASMA COAGULATION/BICAP);  Surgeon: [name], MD;  Location: [place];  Service: Gastroenterology;  Laterality: N/A;  . HOT HEMOSTASIS N/A 10/17/2017   Procedure: HOT HEMOSTASIS (ARGON PLASMA COAGULATION/BICAP);  Surgeon: Otis Brace, MD;  Location: [facility] ENDOSCOPY;  Service: Gastroenterology;  Laterality: N/A;  . HOT HEMOSTASIS N/A 10/19/2017   Procedure: HOT HEMOSTASIS (ARGON PLASMA COAGULATION/BICAP);  Surgeon: Otis Brace, MD;  Location: [facility] ENDOSCOPY;  Service: Gastroenterology;  Laterality: N/A;  . IR IMAGING GUIDED PORT INSERTION  07/08/2018  . L shoulder Surgery  2011  . SUBMUCOSAL INJECTION  09/22/2017   Procedure: SUBMUCOSAL INJECTION;  Surgeon: Clarene Essex, MD;  Location: [facility] ENDOSCOPY;  Service: Endoscopy;;  . SUBMUCOSAL INJECTION  12/04/2018   Procedure: SUBMUCOSAL INJECTION;  Surgeon: Wilford Corner, MD;  Location: [facility] ENDOSCOPY;  Service: Endoscopy;;    I have reviewed the social history and family history with the patient and they are unchanged from previous note.  ALLERGIES:  is allergic to feraheme [ferumoxytol]; nsaids; tomato; and wasp venom.  MEDICATIONS:  Current Outpatient Medications  Medication Sig Dispense Refill  .  albuterol (PROVENTIL HFA;VENTOLIN HFA) 108 (90 Base) MCG/ACT inhaler Inhale 1-2 puffs into the lungs every 6 (six) hours as needed for wheezing or shortness of breath. 1 Inhaler 3  . ALPRAZolam (XANAX) 0.5 MG tablet TAKE 1 TABLET BY MOUTH 2 TIMES A DAY AS NEEDED FOR ANXIETY 60 tablet 0  . Aminocaproic Acid (AMICAR) 1000 MG TABS Take 1 tablet (1,000 mg total) by mouth 2 (two) times daily. 60 each 3  . amLODipine (NORVASC) 10 MG tablet TAKE 1 TABLET(10 MG) BY MOUTH DAILY 90 tablet 0  . atorvastatin (LIPITOR) 80 MG tablet TAKE 1 TABLET(80 MG) BY MOUTH DAILY 30 tablet 3  . cetirizine (ZYRTEC ALLERGY) 10 MG tablet Take 1 tablet (10 mg total) by mouth daily. 30 tablet 2  . diphenhydramine-acetaminophen (TYLENOL PM) 25-500 MG TABS tablet Take 1 tablet by mouth at bedtime as needed (sleep).    [name] guaifenesin (ROBITUSSIN) 100 MG/5ML syrup Take 200 mg by mouth 3 (three) times daily as needed for cough or congestion.    . hydrochlorothiazide (HYDRODIURIL) 12.5 MG tablet TAKE 1 TABLET(12.5 MG) BY MOUTH DAILY 90 tablet 0  . lidocaine-prilocaine (EMLA) cream Apply 1 application topically as needed. 30 g 0  . metFORMIN (GLUCOPHAGE) 500 MG tablet TAKE 1 TABLET(500 MG) BY MOUTH TWICE DAILY WITH A MEAL 180 tablet 1  . pantoprazole (PROTONIX) 40 MG tablet Take 1 tablet (40 mg total) by mouth 2 (two) times daily. 60 tablet 5  . potassium chloride (KLOR-CON) 20 MEQ packet Take 20 mEq by mouth daily. 15 packet 0  . prochlorperazine (COMPAZINE) 10 MG tablet Take 1 tablet (10 mg total) by mouth every 6 (six) hours as needed for nausea or vomiting. 30 tablet 1  . sertraline (ZOLOFT) 100 MG tablet Take 1 tablet (100 mg total) by mouth daily. 90 tablet 0  . traMADol (ULTRAM) 50 MG tablet Take 1 tablet (50 mg total) by mouth every 6 (six) hours as needed. 12 tablet 0  . traZODone (DESYREL) 50 MG tablet Take 1 tablet (50 mg total) by mouth at bedtime. 30 tablet 0   No current facility-administered medications for this visit.     Facility-Administered Medications Ordered in Other Visits  Medication Dose Route Frequency Provider Last Rate Last Dose  . ferric gluconate (NULECIT) 125 mg in sodium chloride 0.9 % 100 mL IVPB  125 mg Intravenous Once Truitt Merle, MD        PHYSICAL EXAMINATION: ECOG PERFORMANCE STATUS: 2 - Symptomatic, <50% confined to bed  Vitals:   03/08/19 0827  BP: (!) 181/81  Pulse: 72  Resp: 17  Temp: 98 F (36.7 C)  SpO2: 100%   Filed Weights   03/08/19 0827  Weight: 273 lb 11.2 oz (124.1 kg)    GENERAL:alert, no distress and comfortable SKIN: skin color, texture, turgor are normal, no rashes or significant lesions EYES: normal, Conjunctiva are pink and non-injected, sclera clear  NECK: supple, thyroid normal size, non-tender, without nodularity LYMPH:  no palpable lymphadenopathy in the cervical, axillary  LUNGS: clear to auscultation and percussion with normal breathing effort HEART: regular rate & rhythm and no murmurs and no lower extremity edema ABDOMEN:abdomen soft, non-tender and normal bowel sounds Musculoskeletal:no cyanosis of digits and no clubbing  NEURO: alert & oriented x 3 with fluent speech, no focal motor/sensory deficits  LABORATORY DATA:  I have reviewed the data as listed CBC Latest Ref Rng & Units 03/08/2019 02/18/2019 01/28/2019  WBC 4.0 - 10.5 K/uL 10.2 8.6 12.2(H)  Hemoglobin 12.0 -  15.0 g/dL 8.5(L) 7.8(L) 8.8(L)  Hematocrit 36.0 - 46.0 % 28.2(L) 26.5(L) 29.3(L)  Platelets 150 - 400 K/uL 258 226 283     CMP Latest Ref Rng & Units 03/08/2019 02/18/2019 01/28/2019  Glucose 70 - 99 mg/dL 121(H) 295(H) 96  BUN 6 - 20 mg/dL 10 9 10   Creatinine 0.44 - 1.00 mg/dL 0.87 0.97 0.97  Sodium 135 - 145 mmol/L 142 137 141  Potassium 3.5 - 5.1 mmol/L 3.7 3.4(L) 3.8  Chloride 98 - 111 mmol/L 108 103 105  CO2 22 - 32 mmol/L 24 23 26   Calcium 8.9 - 10.3 mg/dL 9.0 8.8(L) 8.9  Total Protein 6.5 - 8.1 g/dL 7.1 7.3 7.5  Total Bilirubin 0.3 - 1.2 mg/dL 0.3 0.2(L) 0.2(L)   Alkaline Phos 38 - 126 U/L 67 88 69  AST 15 - 41 U/L 15 11(L) 16  ALT 0 - 44 U/L 10 7 7       RADIOGRAPHIC STUDIES: I have personally reviewed the radiological images as listed and agreed with the findings in the report. No results found.   ASSESSMENT & PLAN:  Peggy House is a 58 y.o. 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 Extensive workup found the pt to have HHT based on her personal and familyhistoryand recurrentAVM GI bleedings. -Sheis s/pIR embolization of the left Gastric and short gastric artery branchvesselswithout evidence of residual flow in the Dieulafoy lesionon 12/10/2017. -ShereceivedAv[facility] and then every 2 weeks9/13/19-11/8/19 -She was treated with Tamoxifen and Amicar for her HHT, but due to recent DVT, tamoxifen was stopped and she only takes Amicar as needed for severe epistaxis. -Her primary treatment will now be Avastin and IV Iron and blood transfusions as needed. She has voiced good understanding on not missing treatments as this can lead to dangerous anemia levels. -She is clinically stable with nightly manageable epistaxis. Labs reviewed, Hg 8.5, will Proceed with Avastin and IV  ferric gluconate today -ContinueIVferric gluconateevery 1-2 weekunlessferritin >200, Avastin every 2 weeks. She has missed some treatment in the past.We again discussed consistency in treatments to manage her blood counts, she voices good understanding.  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 -Shepreviouslynotedshe is no longer takingOral Fergon 1 tab TID -She  developed anotherepisodeof severe anemia and required hospitalizationand blood transfusion last week, IV Amicar and IV iron.  -EGDfrom 8/21/20showeda dieulafoy lesion which was clipped and injected, large amount blood found  3. Reactive Leukocytosis and reactive thrombocytosis  -She has mild leukocytosis with dominant neutrophils, likely reactive -Thrombocytosis recently resolved. Leukocytosis intermittent. Continue monitoring. Stable.   4. 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 -I recommend she continue to follow up with her GI, Dr. Cristina Gong -She had severe GI bleeding which required hospitalization in 11/2018 with several blood transfusions and EGD to cauterize her stomach bleeding.  -I started her on Amicar 07/17/18. Given recent DVT she will only take Amicar for severe epistaxis -Her GI bleeding has resolved currently.But has been having almost nightly epistaxis lately, manageable.   5. History of right Arm DVT -She had right arm DVT  that extended to her right clavicleon 12/07/2018 when she was hospitalized for GI bleeding.It is possibly related to Amicar, tamoxifen and port. -She is not a candidate for anticoagulation due to severe GI bleeding -We stopped Tamoxifen, she will only use Amicar for severe epistaxis. Will maintain Port flushes.   6. Smoking cessation -She was smoking 10 cigarettes a day on average before recent hospitalization -I strongly encouraged her to stop smoking completely, she will try.  7. Hypokalemia -On potassium chloride 20 mEq daily, continue. Will monitor  8. Hyperglycemia, new onset diabetes  -I previously advised her to stop eating sweats and reduce her carbohydrate intake.  -I started her on Metformin BID on 02/18/19 to manage this. Continue to f/u with PCP to monitor her A1c and hyperglycemia  9. HTN, uncontrolled  -Her BP has been  intermittently elevated in clinic lately.  -I discussed Avastin can increase her BP. Will continue to monitor.  -I strongly encouraged her to keep up with her HTN medications daily to manage this. She understands.  -she has not refilled her meds, BP every high today, will give clonidine 0.1mg  today, she knows to refill her amlodipine and HCTZ    PLAN: -Hg 8.5 today, no blood transfusion. Proceed with Ferric Gluconate and Avastin today  -We will continue lab every2 weeks (if Hg<9.0, will add lab next week) -IV Avastin every 2 weeks, ferricgluconate every 1 to 2 weeks based on her results(hold if ferritin >200 on last lab) -F/u on 12/10 and 4 weeks afterward   No problem-specific Assessment & Plan notes found for this encounter.   No orders of the defined types were placed in this encounter.  All questions were answered. The patient knows to call the clinic with any problems, questions or concerns. No barriers to learning was detected. I spent 15 minutes counseling the patient face to face. The total time spent in the appointment was 20 minutes and more than 50% was on counseling and review of test results     Truitt Merle, MD 03/08/2019   I, Joslyn Devon, am acting as scribe for Truitt Merle, MD.   I have reviewed the above documentation for accuracy and completeness, and I agree with the above.

## 2019-03-05 DIAGNOSIS — M1990 Unspecified osteoarthritis, unspecified site: Secondary | ICD-10-CM | POA: Diagnosis not present

## 2019-03-08 ENCOUNTER — Inpatient Hospital Stay (HOSPITAL_BASED_OUTPATIENT_CLINIC_OR_DEPARTMENT_OTHER): Payer: Medicaid Other | Admitting: Hematology

## 2019-03-08 ENCOUNTER — Encounter: Payer: Self-pay | Admitting: Hematology

## 2019-03-08 ENCOUNTER — Other Ambulatory Visit: Payer: Self-pay | Admitting: Nurse Practitioner

## 2019-03-08 ENCOUNTER — Inpatient Hospital Stay: Payer: Medicaid Other

## 2019-03-08 ENCOUNTER — Other Ambulatory Visit: Payer: Self-pay

## 2019-03-08 VITALS — BP 130/73 | HR 70 | Temp 98.2°F | Resp 16

## 2019-03-08 VITALS — BP 181/81 | HR 72 | Temp 98.0°F | Resp 17 | Ht 65.5 in | Wt 273.7 lb

## 2019-03-08 DIAGNOSIS — I780 Hereditary hemorrhagic telangiectasia: Secondary | ICD-10-CM

## 2019-03-08 DIAGNOSIS — D500 Iron deficiency anemia secondary to blood loss (chronic): Secondary | ICD-10-CM

## 2019-03-08 DIAGNOSIS — M1990 Unspecified osteoarthritis, unspecified site: Secondary | ICD-10-CM | POA: Diagnosis not present

## 2019-03-08 DIAGNOSIS — I10 Essential (primary) hypertension: Secondary | ICD-10-CM

## 2019-03-08 DIAGNOSIS — Z95828 Presence of other vascular implants and grafts: Secondary | ICD-10-CM

## 2019-03-08 DIAGNOSIS — Z5112 Encounter for antineoplastic immunotherapy: Secondary | ICD-10-CM | POA: Diagnosis not present

## 2019-03-08 LAB — CMP (CANCER CENTER ONLY)
ALT: 10 U/L (ref 0–44)
AST: 15 U/L (ref 15–41)
Albumin: 3.5 g/dL (ref 3.5–5.0)
Alkaline Phosphatase: 67 U/L (ref 38–126)
Anion gap: 10 (ref 5–15)
BUN: 10 mg/dL (ref 6–20)
CO2: 24 mmol/L (ref 22–32)
Calcium: 9 mg/dL (ref 8.9–10.3)
Chloride: 108 mmol/L (ref 98–111)
Creatinine: 0.87 mg/dL (ref 0.44–1.00)
GFR, Est AFR Am: >60 mL/min (ref >60)
GFR, Estimated: >60 mL/min (ref >60)
Glucose, Bld: 121 mg/dL — ABNORMAL HIGH (ref 70–99)
Potassium: 3.7 mmol/L (ref 3.5–5.1)
Sodium: 142 mmol/L (ref 135–145)
Total Bilirubin: 0.3 mg/dL (ref 0.3–1.2)
Total Protein: 7.1 g/dL (ref 6.5–8.1)

## 2019-03-08 LAB — CBC WITH DIFFERENTIAL (CANCER CENTER ONLY)
Abs Immature Granulocytes: 0.04 10*3/uL (ref 0.00–0.07)
Basophils Absolute: 0 10*3/uL (ref 0.0–0.1)
Basophils Relative: 0 %
Eosinophils Absolute: 0.1 10*3/uL (ref 0.0–0.5)
Eosinophils Relative: 1 %
HCT: 28.2 % — ABNORMAL LOW (ref 36.0–46.0)
Hemoglobin: 8.5 g/dL — ABNORMAL LOW (ref 12.0–15.0)
Immature Granulocytes: 0 %
Lymphocytes Relative: 16 %
Lymphs Abs: 1.6 10*3/uL (ref 0.7–4.0)
MCH: 24.6 pg — ABNORMAL LOW (ref 26.0–34.0)
MCHC: 30.1 g/dL (ref 30.0–36.0)
MCV: 81.5 fL (ref 80.0–100.0)
Monocytes Absolute: 0.5 10*3/uL (ref 0.1–1.0)
Monocytes Relative: 5 %
Neutro Abs: 8 10*3/uL — ABNORMAL HIGH (ref 1.7–7.7)
Neutrophils Relative %: 78 %
Platelet Count: 258 10*3/uL (ref 150–400)
RBC: 3.46 MIL/uL — ABNORMAL LOW (ref 3.87–5.11)
RDW: 20.1 % — ABNORMAL HIGH (ref 11.5–15.5)
WBC Count: 10.2 10*3/uL (ref 4.0–10.5)
nRBC: 0 % (ref 0.0–0.2)

## 2019-03-08 LAB — IRON AND TIBC
Iron: 30 ug/dL — ABNORMAL LOW (ref 41–142)
Saturation Ratios: 11 % — ABNORMAL LOW (ref 21–57)
TIBC: 263 ug/dL (ref 236–444)
UIBC: 233 ug/dL (ref 120–384)

## 2019-03-08 LAB — FERRITIN: Ferritin: 47 ng/mL (ref 11–307)

## 2019-03-08 LAB — SAMPLE TO BLOOD BANK

## 2019-03-08 LAB — RETICULOCYTES
Immature Retic Fract: 32.5 % — ABNORMAL HIGH (ref 2.3–15.9)
RBC.: 3.45 MIL/uL — ABNORMAL LOW (ref 3.87–5.11)
Retic Count, Absolute: 61.8 10*3/uL (ref 19.0–186.0)
Retic Ct Pct: 1.8 % (ref 0.4–3.1)

## 2019-03-08 MED ORDER — HEPARIN SOD (PORK) LOCK FLUSH 100 UNIT/ML IV SOLN
250.0000 [IU] | Freq: Once | INTRAVENOUS | Status: DC | PRN
  Filled 2019-03-08: qty 5

## 2019-03-08 MED ORDER — HEPARIN SOD (PORK) LOCK FLUSH 100 UNIT/ML IV SOLN
500.0000 [IU] | Freq: Once | INTRAVENOUS | Status: AC
  Administered 2019-03-08: 11:00:00 500 [IU]
  Filled 2019-03-08: qty 5

## 2019-03-08 MED ORDER — CLONIDINE HCL 0.1 MG PO TABS
0.1000 mg | ORAL_TABLET | Freq: Once | ORAL | Status: AC
  Administered 2019-03-08: 0.1 mg via ORAL

## 2019-03-08 MED ORDER — SODIUM CHLORIDE 0.9 % IV SOLN
Freq: Once | INTRAVENOUS | Status: AC
  Administered 2019-03-08: 10:00:00 via INTRAVENOUS
  Filled 2019-03-08: qty 250

## 2019-03-08 MED ORDER — ALTEPLASE 2 MG IJ SOLR
2.0000 mg | Freq: Once | INTRAMUSCULAR | Status: DC | PRN
  Filled 2019-03-08: qty 2

## 2019-03-08 MED ORDER — SODIUM CHLORIDE 0.9% FLUSH
10.0000 mL | Freq: Once | INTRAVENOUS | Status: AC
  Administered 2019-03-08: 10 mL
  Filled 2019-03-08: qty 10

## 2019-03-08 MED ORDER — SODIUM CHLORIDE 0.9% FLUSH
3.0000 mL | Freq: Once | INTRAVENOUS | Status: DC | PRN
  Filled 2019-03-08: qty 10

## 2019-03-08 MED ORDER — SODIUM CHLORIDE 0.9 % IV SOLN
125.0000 mg | Freq: Once | INTRAVENOUS | Status: AC
  Administered 2019-03-08: 125 mg via INTRAVENOUS
  Filled 2019-03-08: qty 10

## 2019-03-08 MED ORDER — CLONIDINE HCL 0.1 MG PO TABS
ORAL_TABLET | ORAL | Status: AC
  Filled 2019-03-08: qty 1

## 2019-03-08 NOTE — Patient Instructions (Signed)
Sodium Ferric Gluconate Complex injection What is this medicine? SODIUM FERRIC GLUCONATE COMPLEX (SOE dee um FER ik GLOO koe nate KOM pleks) is an iron replacement. It is used with epoetin therapy to treat low iron levels in patients who are receiving hemodialysis. This medicine may be used for other purposes; ask your health care provider or pharmacist if you have questions. COMMON BRAND NAME(S): Ferrlecit, Nulecit What should I tell my health care provider before I take this medicine? They need to know if you have any of the following conditions:  anemia that is not from iron deficiency  high levels of iron in the body  an unusual or allergic reaction to iron, benzyl alcohol, other medicines, foods, dyes, or preservatives  pregnant or are trying to become pregnant  breast-feeding How should I use this medicine? This medicine is for infusion into a vein. It is given by a health care professional in a hospital or clinic setting. Talk to your pediatrician regarding the use of this medicine in children. While this drug may be prescribed for children as young as 6 years old for selected conditions, precautions do apply. Overdosage: If you think you have taken too much of this medicine contact a poison control center or emergency room at once. NOTE: This medicine is only for you. Do not share this medicine with others. What if I miss a dose? It is important not to miss your dose. Call your doctor or health care professional if you are unable to keep an appointment. What may interact with this medicine? Do not take this medicine with any of the following medications:  deferoxamine  dimercaprol  other iron products This medicine may also interact with the following medications:  chloramphenicol  deferasirox  medicine for blood pressure like enalapril This list may not describe all possible interactions. Give your health care provider a list of all the medicines, herbs,  non-prescription drugs, or dietary supplements you use. Also tell them if you smoke, drink alcohol, or use illegal drugs. Some items may interact with your medicine. What should I watch for while using this medicine? Your condition will be monitored carefully while you are receiving this medicine. Visit your doctor for check-ups as directed. What side effects may I notice from receiving this medicine? Side effects that you should report to your doctor or health care professional as soon as possible:  allergic reactions like skin rash, itching or hives, swelling of the face, lips, or tongue  breathing problems  changes in hearing  changes in vision  chills, flushing, or sweating  fast, irregular heartbeat  feeling faint or lightheaded, falls  fever, flu-like symptoms  high or low blood pressure  pain, tingling, numbness in the hands or feet  severe pain in the chest, back, flanks, or groin  swelling of the ankles, feet, hands  trouble passing urine or change in the amount of urine  unusually weak or tired Side effects that usually do not require medical attention (report to your doctor or health care professional if they continue or are bothersome):  cramps  dark colored stools  diarrhea  headache  nausea, vomiting  stomach upset This list may not describe all possible side effects. Call your doctor for medical advice about side effects. You may report side effects to FDA at 1-800-FDA-1088. Where should I keep my medicine? This drug is given in a hospital or clinic and will not be stored at home. NOTE: This sheet is a summary. It may not cover all   possible information. If you have questions about this medicine, talk to your doctor, pharmacist, or health care provider.  2020 Elsevier/Gold Standard (2007-12-02 15:58:57)  

## 2019-03-09 ENCOUNTER — Telehealth: Payer: Self-pay | Admitting: Hematology

## 2019-03-09 DIAGNOSIS — M1990 Unspecified osteoarthritis, unspecified site: Secondary | ICD-10-CM | POA: Diagnosis not present

## 2019-03-09 NOTE — Telephone Encounter (Signed)
Scheduled appt per 11/23 los.  Spoke with pt and she is aware of her scheduled appt dates and time.

## 2019-03-09 NOTE — Progress Notes (Signed)
Notified RN to release Avastin as well on 03/08/19.  Patient is due for Avastin and Ferrlecit (once BP is decreased). Avastin not released. Notified MD that patient didn't receive Avastin on 03/08/19.   B. Corey Skains, PharmD, BCPS, BCOP

## 2019-03-10 ENCOUNTER — Telehealth: Payer: Self-pay

## 2019-03-10 DIAGNOSIS — M1990 Unspecified osteoarthritis, unspecified site: Secondary | ICD-10-CM | POA: Diagnosis not present

## 2019-03-10 NOTE — Telephone Encounter (Signed)
Left message for patient regarding the fact that she did not receive Avastin when she was here on 11/23.  I asked that she call me back to let me know if she can come in on Friday 11/27 and also what time works for her to come?

## 2019-03-10 NOTE — Telephone Encounter (Signed)
I left patient another voice message regarding receiving Avastin, I asked her to call back and let me know if she can come in Friday 11/27 or Monday 11/30.

## 2019-03-10 NOTE — Telephone Encounter (Signed)
-----   Message from Truitt Merle, MD sent at 03/09/2019  5:06 PM EST ----- Regarding: RE: Avastin not received on 03/08/19 Malachy Mood,  Please call pt tomorrow regarding the missing dose of Avastin, and see if she can come in tomorrow. Please work with infusion charge nurse to get her scheduled. If it can not be done tomorrow, then do the next earliest available appointment for her (Friday or early next week) and adjust her future appointments (she is on Avastin avery 2 weeks and iv iron every 1-2 weeks)   Thanks  Krista Blue  ----- Message ----- From: Neysa Hotter, [facility] Sent: 03/09/2019   1:38 PM EST To: Truitt Merle, MD Subject: Avastin not received on 03/08/19               Dr. Burr Medico,  I wanted to let you know that Ms. Isley didn't receive her Avastin yesterday. She did receive the iron. The RN was notified to release the Avastin but it was forgotten. She is due back here on 12/3.  Thanks, [employer]

## 2019-03-11 DIAGNOSIS — M1990 Unspecified osteoarthritis, unspecified site: Secondary | ICD-10-CM | POA: Diagnosis not present

## 2019-03-12 DIAGNOSIS — M1990 Unspecified osteoarthritis, unspecified site: Secondary | ICD-10-CM | POA: Diagnosis not present

## 2019-03-15 ENCOUNTER — Encounter: Payer: Medicaid Other | Admitting: Internal Medicine

## 2019-03-15 ENCOUNTER — Encounter: Payer: Self-pay | Admitting: Internal Medicine

## 2019-03-15 DIAGNOSIS — M1990 Unspecified osteoarthritis, unspecified site: Secondary | ICD-10-CM | POA: Diagnosis not present

## 2019-03-15 NOTE — Telephone Encounter (Signed)
Hi [employer] for requirements for [employer]  Thanks   [place]

## 2019-03-16 ENCOUNTER — Other Ambulatory Visit: Payer: Self-pay

## 2019-03-16 ENCOUNTER — Other Ambulatory Visit: Payer: Self-pay | Admitting: Nurse Practitioner

## 2019-03-16 ENCOUNTER — Ambulatory Visit: Payer: Medicaid Other | Admitting: Podiatry

## 2019-03-16 ENCOUNTER — Encounter: Payer: Self-pay | Admitting: Podiatry

## 2019-03-16 DIAGNOSIS — D492 Neoplasm of unspecified behavior of bone, soft tissue, and skin: Secondary | ICD-10-CM

## 2019-03-16 DIAGNOSIS — L851 Acquired keratosis [keratoderma] palmaris et plantaris: Secondary | ICD-10-CM

## 2019-03-16 DIAGNOSIS — E1159 Type 2 diabetes mellitus with other circulatory complications: Secondary | ICD-10-CM

## 2019-03-16 DIAGNOSIS — M1990 Unspecified osteoarthritis, unspecified site: Secondary | ICD-10-CM | POA: Diagnosis not present

## 2019-03-16 MED ORDER — ALPRAZOLAM 0.5 MG PO TABS: ORAL_TABLET | ORAL | 0 refills | Status: DC

## 2019-03-16 NOTE — Progress Notes (Signed)
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  She presents to the office 2 weeks ago at which time she was dispensed  revitaderm  and told to soak her feet.  She says she is been treating her feet for the last 2 weeks and states that she can now walk without discomfort in her feet.  She presents the office today for continued evaluation and treatment of this acquired keratoderma plantar aspect feet bilateral.  Vascular  Dorsalis pedis and posterior tibial pulses are palpable  B/L.  Capillary return  WNL.  Temperature gradient is  WNL.  Skin turgor  WNL  Sensorium  Senn Weinstein monofilament wire  WNL. Normal tactile sensation.  Nail Exam  Patient has normal nails with no evidence of bacterial or fungal infection.  Orthopedic  Exam  Muscle tone and muscle strength  WNL.  No limitations of motion feet  B/L.  No crepitus or joint effusion noted.  Foot type is unremarkable and digits show no abnormalities.  Bony prominences are unremarkable.  Skin  No open lesions.  Normal skin texture and turgor.  Continued thickened skin at the level of the heel and the outside of both feet.  The skin is no longer peeling as her first visit noted.    Acquired keratoderma  .    ROV.  She was told to continue the same regimen at home and to return to the office in 4 weeks at which time we can check on the status of her feet.   Gardiner Barefoot DPM

## 2019-03-16 NOTE — Telephone Encounter (Signed)
Refill request

## 2019-03-17 DIAGNOSIS — M1990 Unspecified osteoarthritis, unspecified site: Secondary | ICD-10-CM | POA: Diagnosis not present

## 2019-03-18 ENCOUNTER — Inpatient Hospital Stay: Payer: Medicaid Other

## 2019-03-18 ENCOUNTER — Other Ambulatory Visit: Payer: Self-pay | Admitting: *Deleted

## 2019-03-18 ENCOUNTER — Inpatient Hospital Stay: Payer: Medicaid Other | Attending: Hematology

## 2019-03-18 ENCOUNTER — Other Ambulatory Visit: Payer: Self-pay

## 2019-03-18 VITALS — BP 105/60 | HR 64 | Temp 98.6°F | Resp 18

## 2019-03-18 DIAGNOSIS — D500 Iron deficiency anemia secondary to blood loss (chronic): Secondary | ICD-10-CM

## 2019-03-18 DIAGNOSIS — I780 Hereditary hemorrhagic telangiectasia: Secondary | ICD-10-CM

## 2019-03-18 DIAGNOSIS — M1990 Unspecified osteoarthritis, unspecified site: Secondary | ICD-10-CM | POA: Diagnosis not present

## 2019-03-18 DIAGNOSIS — Z95828 Presence of other vascular implants and grafts: Secondary | ICD-10-CM

## 2019-03-18 DIAGNOSIS — Z5112 Encounter for antineoplastic immunotherapy: Secondary | ICD-10-CM | POA: Insufficient documentation

## 2019-03-18 DIAGNOSIS — Z79899 Other long term (current) drug therapy: Secondary | ICD-10-CM | POA: Diagnosis not present

## 2019-03-18 LAB — CBC WITH DIFFERENTIAL (CANCER CENTER ONLY)
Abs Immature Granulocytes: 0.06 10*3/uL (ref 0.00–0.07)
Basophils Absolute: 0 10*3/uL (ref 0.0–0.1)
Basophils Relative: 0 %
Eosinophils Absolute: 0.1 10*3/uL (ref 0.0–0.5)
Eosinophils Relative: 1 %
HCT: 29 % — ABNORMAL LOW (ref 36.0–46.0)
Hemoglobin: 8.7 g/dL — ABNORMAL LOW (ref 12.0–15.0)
Immature Granulocytes: 0 %
Lymphocytes Relative: 11 %
Lymphs Abs: 1.5 10*3/uL (ref 0.7–4.0)
MCH: 24.8 pg — ABNORMAL LOW (ref 26.0–34.0)
MCHC: 30 g/dL (ref 30.0–36.0)
MCV: 82.6 fL (ref 80.0–100.0)
Monocytes Absolute: 0.5 10*3/uL (ref 0.1–1.0)
Monocytes Relative: 4 %
Neutro Abs: 11.5 10*3/uL — ABNORMAL HIGH (ref 1.7–7.7)
Neutrophils Relative %: 84 %
Platelet Count: 264 10*3/uL (ref 150–400)
RBC: 3.51 MIL/uL — ABNORMAL LOW (ref 3.87–5.11)
RDW: 21 % — ABNORMAL HIGH (ref 11.5–15.5)
WBC Count: 13.7 10*3/uL — ABNORMAL HIGH (ref 4.0–10.5)
nRBC: 0 % (ref 0.0–0.2)

## 2019-03-18 LAB — IRON AND TIBC
Iron: 27 ug/dL — ABNORMAL LOW (ref 41–142)
Saturation Ratios: 9 % — ABNORMAL LOW (ref 21–57)
TIBC: 310 ug/dL (ref 236–444)
UIBC: 284 ug/dL (ref 120–384)

## 2019-03-18 LAB — FERRITIN: Ferritin: 53 ng/mL (ref 11–307)

## 2019-03-18 LAB — TOTAL PROTEIN, URINE DIPSTICK: Protein, ur: 30 mg/dL — AB

## 2019-03-18 MED ORDER — SODIUM CHLORIDE 0.9 % IV SOLN
125.0000 mg | Freq: Once | INTRAVENOUS | Status: AC
  Administered 2019-03-18: 14:00:00 125 mg via INTRAVENOUS
  Filled 2019-03-18: qty 10

## 2019-03-18 MED ORDER — SODIUM CHLORIDE 0.9 % IV SOLN
5.0000 mg/kg | Freq: Once | INTRAVENOUS | Status: AC
  Administered 2019-03-18: 600 mg via INTRAVENOUS
  Filled 2019-03-18: qty 8

## 2019-03-18 MED ORDER — SODIUM CHLORIDE 0.9% FLUSH
10.0000 mL | INTRAVENOUS | Status: DC | PRN
  Administered 2019-03-18: 16:00:00 10 mL
  Filled 2019-03-18: qty 10

## 2019-03-18 MED ORDER — SODIUM CHLORIDE 0.9% FLUSH
10.0000 mL | Freq: Once | INTRAVENOUS | Status: AC
  Administered 2019-03-18: 10 mL
  Filled 2019-03-18: qty 10

## 2019-03-18 MED ORDER — HEPARIN SOD (PORK) LOCK FLUSH 100 UNIT/ML IV SOLN
500.0000 [IU] | Freq: Once | INTRAVENOUS | Status: AC | PRN
  Administered 2019-03-18: 500 [IU]
  Filled 2019-03-18: qty 5

## 2019-03-18 MED ORDER — SODIUM CHLORIDE 0.9 % IV SOLN
INTRAVENOUS | Status: DC
  Administered 2019-03-18: 13:00:00 via INTRAVENOUS
  Filled 2019-03-18: qty 250

## 2019-03-18 NOTE — Patient Instructions (Signed)
[facility] Discharge Instructions for Patients Receiving Chemotherapy  Today you received the following chemotherapy agents Avastin  To help prevent nausea and vomiting after your treatment, we encourage you to take your nausea medication as directed.  If you develop nausea and vomiting that is not controlled by your nausea medication, call the clinic.   BELOW ARE SYMPTOMS THAT SHOULD BE REPORTED IMMEDIATELY:  *FEVER GREATER THAN 100.5 F  *CHILLS WITH OR WITHOUT FEVER  NAUSEA AND VOMITING THAT IS NOT CONTROLLED WITH YOUR NAUSEA MEDICATION  *UNUSUAL SHORTNESS OF BREATH  *UNUSUAL BRUISING OR BLEEDING  TENDERNESS IN MOUTH AND THROAT WITH OR WITHOUT PRESENCE OF ULCERS  *URINARY PROBLEMS  *BOWEL PROBLEMS  UNUSUAL RASH Items with * indicate a potential emergency and should be followed up as soon as possible.  Feel free to call the clinic should you have any questions or concerns. The clinic phone number is [phone].  Please show the [place] at check-in to the Emergency Department and triage nurse.  Sodium Ferric Gluconate Complex injection What is this medicine? SODIUM FERRIC GLUCONATE COMPLEX (SOE dee um FER ik GLOO koe nate KOM pleks) is an iron replacement. It is used with epoetin therapy to treat low iron levels in patients who are receiving hemodialysis. This medicine may be used for other purposes; ask your health care provider or pharmacist if you have questions. COMMON BRAND NAME(S): Ferrlecit, Nulecit What should I tell my health care provider before I take this medicine? They need to know if you have any of the following conditions:  anemia that is not from iron deficiency  high levels of iron in the body  an unusual or allergic reaction to iron, benzyl alcohol, other medicines, foods, dyes, or preservatives  pregnant or are trying to become pregnant  breast-feeding How should I use this medicine? This medicine is for infusion into  a vein. It is given by a health care professional in a hospital or clinic setting. Talk to your pediatrician regarding the use of this medicine in children. While this drug may be prescribed for children as young as 25 years old for selected conditions, precautions do apply. Overdosage: If you think you have taken too much of this medicine contact a poison control center or emergency room at once. NOTE: This medicine is only for you. Do not share this medicine with others. What if I miss a dose? It is important not to miss your dose. Call your doctor or health care professional if you are unable to keep an appointment. What may interact with this medicine? Do not take this medicine with any of the following medications:  deferoxamine  dimercaprol  other iron products This medicine may also interact with the following medications:  chloramphenicol  deferasirox  medicine for blood pressure like enalapril This list may not describe all possible interactions. Give your health care provider a list of all the medicines, herbs, non-prescription drugs, or dietary supplements you use. Also tell them if you smoke, drink alcohol, or use illegal drugs. Some items may interact with your medicine. What should I watch for while using this medicine? Your condition will be monitored carefully while you are receiving this medicine. Visit your doctor for check-ups as directed. What side effects may I notice from receiving this medicine? Side effects that you should report to your doctor or health care professional as soon as possible:  allergic reactions like skin rash, itching or hives, swelling of the face, lips, or tongue  breathing problems  changes in hearing  changes in vision  chills, flushing, or sweating  fast, irregular heartbeat  feeling faint or lightheaded, falls  fever, flu-like symptoms  high or low blood pressure  pain, tingling, numbness in the hands or feet  severe pain  in the chest, back, flanks, or groin  swelling of the ankles, feet, hands  trouble passing urine or change in the amount of urine  unusually weak or tired Side effects that usually do not require medical attention (report to your doctor or health care professional if they continue or are bothersome):  cramps  dark colored stools  diarrhea  headache  nausea, vomiting  stomach upset This list may not describe all possible side effects. Call your doctor for medical advice about side effects. You may report side effects to FDA at 1-800-FDA-1088. Where should I keep my medicine? This drug is given in a hospital or clinic and will not be stored at home. NOTE: This sheet is a summary. It may not cover all possible information. If you have questions about this medicine, talk to your doctor, pharmacist, or health care provider.  2020 Elsevier/Gold Standard (2007-12-02 15:58:57)

## 2019-03-19 DIAGNOSIS — M1990 Unspecified osteoarthritis, unspecified site: Secondary | ICD-10-CM | POA: Diagnosis not present

## 2019-03-22 DIAGNOSIS — M1990 Unspecified osteoarthritis, unspecified site: Secondary | ICD-10-CM | POA: Diagnosis not present

## 2019-03-23 DIAGNOSIS — M1990 Unspecified osteoarthritis, unspecified site: Secondary | ICD-10-CM | POA: Diagnosis not present

## 2019-03-23 NOTE — Progress Notes (Signed)
[facility]   [phone] Fax:([phone]) [phone]   Clinic Follow up Note   Patient Care Team: [name], MD as PCP - General (Internal Medicine) 03/25/2019  CHIEF COMPLAINT: F/u HHT and anemia   PREVIOUS THERAPY:  -Multiple EGD, small bowel enteroscope, C-scope with APC -Oral and iv amicar were given during hospital stay, no effect -IR embolization ofof the left gastric and short gastric artery branch vessels without evidence of residual flow in the Dieulafoy lesionon 12/10/17 -Avastin on 8/2 and 8/30 at [facility]; starting 12/26/17 continue 5mg /kg q2 weeksuntil 02/20/18, restarted on 04/03/2018  CURRENT THERAPY: -Tamoxifen started in 10/2017 for HHTstopped 12/10/18 due toDVT -Blood transfusion for severe anemia secondary to GI bleedingas needed -ivferric gluconate every1-2 weeks as neededwith ferritin goal 100-200 -RestartedAvastin q2weeks on 04/03/18 -Amicar 1g bidstarting4/06/2018. Reduced to only as needed on 12/10/18 due toDVT  INTERVAL HISTORY: [name] returns for f/u and treatment as scheduled. She was last seen on 03/08/19 and completed last avastin on 03/18/19. She feels pretty well today. She had watery diarrhea last week that resolved 4 days ago. Stools now formed, brown, not black or bloody. Has occasional low abdominal cramping. Rare nausea is managed with anti-emetics PRN. Epistaxis is stable, occurs nightly for about 30 minutes which is her baseline. She notices clots occasionally. No other bleeding. Energy is stable. Cough is at baseline, occasionally produces white or yellow phlegm. She has a mild sore throat starting last night. No fever, chills, chest pain, dyspnea. No new leg edema or swelling.    MEDICAL HISTORY:  Past Medical History:  Diagnosis Date  . Anxiety   . Arthritis    knnes,back  . GERD (gastroesophageal reflux disease)   . 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 History of swelling of feet   .  Hyperlipidemia   . Hypertension   . Major depressive disorder, recurrent episode ([place]) 06/05/2015  . Obesity   . Snores   . 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 ABDOMINAL HYSTERECTOMY    . CARPAL TUNNEL RELEASE  05/13/2011   Procedure: CARPAL TUNNEL RELEASE;  Surgeon: Nita Sells, MD;  Location: [place];  Service: Orthopedics;  Laterality: Left;  . COLONOSCOPY WITH PROPOFOL N/A 04/28/2014   Procedure: COLONOSCOPY WITH PROPOFOL;  Surgeon: Cleotis Nipper, MD;  Location: [facility] ENDOSCOPY;  Service: Endoscopy;  Laterality: N/A;  . DG TOES*L*  2/10   rt  . DILATION AND CURETTAGE OF UTERUS    . ENTEROSCOPY N/A 10/17/2017   Procedure: ENTEROSCOPY;  Surgeon: Otis Brace, MD;  Location: [place] ENDOSCOPY;  Service: Gastroenterology;  Laterality: N/A;  . ESOPHAGOGASTRODUODENOSCOPY N/A 04/10/2014   Procedure: ESOPHAGOGASTRODUODENOSCOPY (EGD);  Surgeon: [name], MD;  Location[facility] ENDOSCOPY;  Service: Endoscopy;  Laterality: N/A;  . ESOPHAGOGASTRODUODENOSCOPY N/A 05/10/2017   Procedure: ESOPHAGOGASTRODUODENOSCOPY (EGD);  Surgeon: Ronald Lobo, MD;  Location: [facility] ENDOSCOPY;  Service: Endoscopy;  Laterality: N/A;  . ESOPHAGOGASTRODUODENOSCOPY N/A 09/22/2017   Procedure: ESOPHAGOGASTRODUODENOSCOPY (EGD);  Surgeon: Clarene Essex, MD;  Location: [place];  Service: Endoscopy;  Laterality: N/A;  bedside  . ESOPHAGOGASTRODUODENOSCOPY (EGD) WITH PROPOFOL N/A 04/27/2014   Procedure: ESOPHAGOGASTRODUODENOSCOPY (EGD) WITH PROPOFOL;  Surgeon: Cleotis Nipper, MD;  Location: [place];  Service: Endoscopy;  Laterality: N/A;  possible apc  . ESOPHAGOGASTRODUODENOSCOPY (EGD) WITH PROPOFOL N/A 09/30/2017   Procedure: ESOPHAGOGASTRODUODENOSCOPY (EGD) WITH PROPOFOL;  Surgeon: [name], MD;  Location: [facility]  ENDOSCOPY;  Service: Gastroenterology;  Laterality: N/A;  .  ESOPHAGOGASTRODUODENOSCOPY (EGD) WITH PROPOFOL N/A 10/01/2017   Procedure: ESOPHAGOGASTRODUODENOSCOPY (EGD) WITH PROPOFOL;  Surgeon: [name], MD;  Location: [place];  Service: Gastroenterology;  Laterality: N/A;  . ESOPHAGOGASTRODUODENOSCOPY (EGD) WITH PROPOFOL N/A 10/08/2017   Procedure: ESOPHAGOGASTRODUODENOSCOPY (EGD) WITH PROPOFOL;  Surgeon: Otis Brace, MD;  Location: [facility] ENDOSCOPY;  Service: Gastroenterology;  Laterality: N/A;  . ESOPHAGOGASTRODUODENOSCOPY (EGD) WITH PROPOFOL N/A 10/17/2017   Procedure: ESOPHAGOGASTRODUODENOSCOPY (EGD) WITH PROPOFOL;  Surgeon: Otis Brace, MD;  Location: [facility] ENDOSCOPY;  Service: Gastroenterology;  Laterality: N/A;  . ESOPHAGOGASTRODUODENOSCOPY (EGD) WITH PROPOFOL N/A 10/19/2017   Procedure: ESOPHAGOGASTRODUODENOSCOPY (EGD) WITH PROPOFOL;  Surgeon: Otis Brace, MD;  Location: [facility] ENDOSCOPY;  Service: Gastroenterology;  Laterality: N/A;  . ESOPHAGOGASTRODUODENOSCOPY (EGD) WITH PROPOFOL N/A 12/04/2018   Procedure: ESOPHAGOGASTRODUODENOSCOPY (EGD) WITH PROPOFOL;  Surgeon: Wilford Corner, MD;  Location: [facility] ENDOSCOPY;  Service: Endoscopy;  Laterality: N/A;  . GIVENS CAPSULE STUDY N/A 10/02/2017   Procedure: GIVENS CAPSULE STUDY;  Surgeon: [name], MD;  Location: [place];  Service: Gastroenterology;  Laterality: N/A;  . GIVENS CAPSULE STUDY N/A 10/08/2017   Procedure: GIVENS CAPSULE STUDY;  Surgeon: Otis Brace, MD;  Location: [place];  Service: Gastroenterology;  Laterality: N/A;  endoscopic placement of capsule  . HEMOSTASIS CLIP PLACEMENT  12/04/2018   Procedure: HEMOSTASIS CLIP PLACEMENT;  Surgeon: Wilford Corner, MD;  Location: [facility] ENDOSCOPY;  Service: Endoscopy;;  . HOT HEMOSTASIS N/A 04/27/2014   Procedure: HOT HEMOSTASIS (ARGON PLASMA COAGULATION/BICAP);  Surgeon: Cleotis Nipper, MD;  Location: [facility] ENDOSCOPY;  Service: Endoscopy;  Laterality: N/A;  . HOT HEMOSTASIS N/A 09/30/2017   Procedure: HOT HEMOSTASIS (ARGON  PLASMA COAGULATION/BICAP);  Surgeon: [name], MD;  Location: [place];  Service: Gastroenterology;  Laterality: N/A;  . HOT HEMOSTASIS N/A 10/01/2017   Procedure: HOT HEMOSTASIS (ARGON PLASMA COAGULATION/BICAP);  Surgeon: [name], MD;  Location: [place];  Service: Gastroenterology;  Laterality: N/A;  . HOT HEMOSTASIS N/A 10/17/2017   Procedure: HOT HEMOSTASIS (ARGON PLASMA COAGULATION/BICAP);  Surgeon: Otis Brace, MD;  Location: [facility] ENDOSCOPY;  Service: Gastroenterology;  Laterality: N/A;  . HOT HEMOSTASIS N/A 10/19/2017   Procedure: HOT HEMOSTASIS (ARGON PLASMA COAGULATION/BICAP);  Surgeon: Otis Brace, MD;  Location: [facility] ENDOSCOPY;  Service: Gastroenterology;  Laterality: N/A;  . IR IMAGING GUIDED PORT INSERTION  07/08/2018  . L shoulder Surgery  2011  . SUBMUCOSAL INJECTION  09/22/2017   Procedure: SUBMUCOSAL INJECTION;  Surgeon: Clarene Essex, MD;  Location: [facility] ENDOSCOPY;  Service: Endoscopy;;  . SUBMUCOSAL INJECTION  12/04/2018   Procedure: SUBMUCOSAL INJECTION;  Surgeon: Wilford Corner, MD;  Location: [facility] ENDOSCOPY;  Service: Endoscopy;;    I have reviewed the social history and family history with the patient and they are unchanged from previous note.  ALLERGIES:  is allergic to feraheme [ferumoxytol]; nsaids; tomato; and wasp venom.  MEDICATIONS:  Current Outpatient Medications  Medication Sig Dispense Refill  . albuterol (PROVENTIL HFA;VENTOLIN HFA) 108 (90 Base) MCG/ACT inhaler Inhale 1-2 puffs into the lungs every 6 (six) hours as needed for wheezing or shortness of breath. 1 Inhaler 3  . ALPRAZolam (XANAX) 0.5 MG tablet TAKE 1 TABLET BY MOUTH 2 TIMES A DAY AS NEEDED FOR ANXIETY 60 tablet 0  . Aminocaproic Acid (AMICAR) 1000 MG TABS Take 1 tablet (1,000 mg total) by mouth 2 (two) times daily. 60 each 3  . amLODipine (NORVASC) 10 MG tablet TAKE 1 TABLET(10 MG) BY MOUTH DAILY 90 tablet 0  . atorvastatin (LIPITOR) 80 MG  tablet TAKE 1 TABLET(80 MG) BY MOUTH DAILY  30 tablet 3  . cetirizine (ZYRTEC ALLERGY) 10 MG tablet Take 1 tablet (10 mg total) by mouth daily. 30 tablet 2  . diphenhydramine-acetaminophen (TYLENOL PM) 25-500 MG TABS tablet Take 1 tablet by mouth at bedtime as needed (sleep).    [name] guaifenesin (ROBITUSSIN) 100 MG/5ML syrup Take 200 mg by mouth 3 (three) times daily as needed for cough or congestion.    . hydrochlorothiazide (HYDRODIURIL) 12.5 MG tablet TAKE 1 TABLET(12.5 MG) BY MOUTH DAILY 90 tablet 0  . lidocaine-prilocaine (EMLA) cream Apply 1 application topically as needed. 30 g 0  . metFORMIN (GLUCOPHAGE) 500 MG tablet TAKE 1 TABLET(500 MG) BY MOUTH TWICE DAILY WITH A MEAL 180 tablet 1  . pantoprazole (PROTONIX) 40 MG tablet Take 1 tablet (40 mg total) by mouth 2 (two) times daily. 60 tablet 5  . potassium chloride (KLOR-CON) 20 MEQ packet Take 20 mEq by mouth daily. 15 packet 0  . prochlorperazine (COMPAZINE) 10 MG tablet Take 1 tablet (10 mg total) by mouth every 6 (six) hours as needed for nausea or vomiting. 30 tablet 1  . sertraline (ZOLOFT) 100 MG tablet Take 1 tablet (100 mg total) by mouth daily. 90 tablet 0  . traMADol (ULTRAM) 50 MG tablet Take 1 tablet (50 mg total) by mouth every 6 (six) hours as needed. 12 tablet 0  . traZODone (DESYREL) 50 MG tablet Take 1 tablet (50 mg total) by mouth at bedtime. 30 tablet 0   No current facility-administered medications for this visit.    PHYSICAL EXAMINATION: ECOG PERFORMANCE STATUS: 2 - Symptomatic, <50% confined to bed  Vitals:   03/25/19 0851  BP: (!) 150/86  Pulse: 82  Resp: 17  Temp: 97.8 F (36.6 C)  SpO2: 100%   Filed Weights   03/25/19 0851  Weight: 276 lb 9.6 oz (125.5 kg)    GENERAL:alert, no distress and comfortable SKIN: no rash  EYES: sclera clear LUNGS: clear with normal breathing effort, no wheezes or rhonchi  HEART: regular rate & rhythm, murmur noted, no lower extremity edema ABDOMEN: abdomen soft, round, non-tender and normal bowel sounds NEURO:  alert & oriented x 3 with fluent speech, no focal motor/sensory deficits PAC without erythema   LABORATORY DATA:  I have reviewed the data as listed CBC Latest Ref Rng & Units 03/25/2019 03/18/2019 03/08/2019  WBC 4.0 - 10.5 K/uL 9.7 13.7(H) 10.2  Hemoglobin 12.0 - 15.0 g/dL 8.2(L) 8.7(L) 8.5(L)  Hematocrit 36.0 - 46.0 % 27.8(L) 29.0(L) 28.2(L)  Platelets 150 - 400 K/uL 264 264 258     CMP Latest Ref Rng & Units 03/08/2019 02/18/2019 01/28/2019  Glucose 70 - 99 mg/dL 121(H) 295(H) 96  BUN 6 - 20 mg/dL 10 9 10   Creatinine 0.44 - 1.00 mg/dL 0.87 0.97 0.97  Sodium 135 - 145 mmol/L 142 137 141  Potassium 3.5 - 5.1 mmol/L 3.7 3.4(L) 3.8  Chloride 98 - 111 mmol/L 108 103 105  CO2 22 - 32 mmol/L 24 23 26   Calcium 8.9 - 10.3 mg/dL 9.0 8.8(L) 8.9  Total Protein 6.5 - 8.1 g/dL 7.1 7.3 7.5  Total Bilirubin 0.3 - 1.2 mg/dL 0.3 0.2(L) 0.2(L)  Alkaline Phos 38 - 126 U/L 67 88 69  AST 15 - 41 U/L 15 11(L) 16  ALT 0 - 44 U/L 10 7 7       RADIOGRAPHIC STUDIES: I have personally reviewed the radiological images as listed and agreed with the findings in the  report. No results found.   ASSESSMENT & PLAN: LODELL MAGNIN a 58 y.o.femalewith   1. 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 Reactive Leukocytosis and reactive  thrombocytosis  4. Chronic Epistaxis, h/o recurrent GI Bleeding; Followed byGI, Dr. Michail Sermon and [facility] 5. Right arm DVT, found during hospitalization 11/2018 - not a candidate for anticoagulation due to severe GI bleeding  6. HTN, on amlodipine and HCTZ   Disposition:  [name] appears stable. She continues q2 week avastin and frequent iron infusions. She tolerates treatment well overall. Epistaxis is stable, no other bleeding currently. She has not required Amicar lately. Labs reviewed. Leukocytosis and thrombocytosis resolved. Hgb 8.2, no need for blood transfusion today. Ferritin 51. She will continue avastin q2 weeks and IV ferric gluconate q1-2 weeks unless ferritin >200. F/u in 4 weeks.    Orders Placed This Encounter  Procedures  . CBC with Differential (Cancer Center Only)    Standing Status:   Standing    Number of Occurrences:   100    Standing Expiration Date:   03/24/2020   All questions were answered. The patient knows to call the clinic with any problems, questions or concerns. No barriers to learning was detected.     Alla Feeling, NP 03/25/19

## 2019-03-24 ENCOUNTER — Telehealth: Payer: Self-pay | Admitting: Licensed Clinical Social Worker

## 2019-03-24 DIAGNOSIS — M1990 Unspecified osteoarthritis, unspecified site: Secondary | ICD-10-CM | POA: Diagnosis not present

## 2019-03-24 NOTE — Telephone Encounter (Signed)
Patient was called about referral. Patient is declining services at this time, and she understands that she can request services in the future if she desires.

## 2019-03-24 NOTE — Addendum Note (Signed)
Addended by: Hulan Fray on: 03/24/2019 03:35 PM   Modules accepted: Orders

## 2019-03-25 ENCOUNTER — Inpatient Hospital Stay: Payer: Medicaid Other

## 2019-03-25 ENCOUNTER — Inpatient Hospital Stay (HOSPITAL_BASED_OUTPATIENT_CLINIC_OR_DEPARTMENT_OTHER): Payer: Medicaid Other | Admitting: Nurse Practitioner

## 2019-03-25 ENCOUNTER — Encounter: Payer: Self-pay | Admitting: Nurse Practitioner

## 2019-03-25 ENCOUNTER — Other Ambulatory Visit: Payer: Self-pay

## 2019-03-25 VITALS — BP 150/86 | HR 82 | Temp 97.8°F | Resp 17 | Ht 65.5 in | Wt 276.6 lb

## 2019-03-25 VITALS — BP 143/69 | HR 70 | Temp 97.8°F | Resp 18

## 2019-03-25 DIAGNOSIS — I780 Hereditary hemorrhagic telangiectasia: Secondary | ICD-10-CM

## 2019-03-25 DIAGNOSIS — Z95828 Presence of other vascular implants and grafts: Secondary | ICD-10-CM

## 2019-03-25 DIAGNOSIS — M1990 Unspecified osteoarthritis, unspecified site: Secondary | ICD-10-CM | POA: Diagnosis not present

## 2019-03-25 DIAGNOSIS — D500 Iron deficiency anemia secondary to blood loss (chronic): Secondary | ICD-10-CM | POA: Diagnosis not present

## 2019-03-25 DIAGNOSIS — Z5112 Encounter for antineoplastic immunotherapy: Secondary | ICD-10-CM | POA: Diagnosis not present

## 2019-03-25 LAB — FERRITIN: Ferritin: 51 ng/mL (ref 11–307)

## 2019-03-25 LAB — CBC WITH DIFFERENTIAL (CANCER CENTER ONLY)
Abs Immature Granulocytes: 0.04 10*3/uL (ref 0.00–0.07)
Basophils Absolute: 0 10*3/uL (ref 0.0–0.1)
Basophils Relative: 0 %
Eosinophils Absolute: 0.1 10*3/uL (ref 0.0–0.5)
Eosinophils Relative: 1 %
HCT: 27.8 % — ABNORMAL LOW (ref 36.0–46.0)
Hemoglobin: 8.2 g/dL — ABNORMAL LOW (ref 12.0–15.0)
Immature Granulocytes: 0 %
Lymphocytes Relative: 15 %
Lymphs Abs: 1.5 10*3/uL (ref 0.7–4.0)
MCH: 24.6 pg — ABNORMAL LOW (ref 26.0–34.0)
MCHC: 29.5 g/dL — ABNORMAL LOW (ref 30.0–36.0)
MCV: 83.5 fL (ref 80.0–100.0)
Monocytes Absolute: 0.4 10*3/uL (ref 0.1–1.0)
Monocytes Relative: 4 %
Neutro Abs: 7.7 10*3/uL (ref 1.7–7.7)
Neutrophils Relative %: 80 %
Platelet Count: 264 10*3/uL (ref 150–400)
RBC: 3.33 MIL/uL — ABNORMAL LOW (ref 3.87–5.11)
RDW: 21.2 % — ABNORMAL HIGH (ref 11.5–15.5)
WBC Count: 9.7 10*3/uL (ref 4.0–10.5)
nRBC: 0 % (ref 0.0–0.2)

## 2019-03-25 LAB — IRON AND TIBC
Iron: 33 ug/dL — ABNORMAL LOW (ref 41–142)
Saturation Ratios: 12 % — ABNORMAL LOW (ref 21–57)
TIBC: 263 ug/dL (ref 236–444)
UIBC: 230 ug/dL (ref 120–384)

## 2019-03-25 MED ORDER — SODIUM CHLORIDE 0.9 % IV SOLN
125.0000 mg | Freq: Once | INTRAVENOUS | Status: AC
  Administered 2019-03-25: 125 mg via INTRAVENOUS
  Filled 2019-03-25: qty 10

## 2019-03-25 MED ORDER — SODIUM CHLORIDE 0.9 % IV SOLN
Freq: Once | INTRAVENOUS | Status: AC
  Administered 2019-03-25: 10:00:00 via INTRAVENOUS
  Filled 2019-03-25: qty 250

## 2019-03-25 MED ORDER — SODIUM CHLORIDE 0.9% FLUSH
10.0000 mL | INTRAVENOUS | Status: DC | PRN
  Administered 2019-03-25: 10 mL
  Filled 2019-03-25: qty 10

## 2019-03-25 MED ORDER — SODIUM CHLORIDE 0.9% FLUSH
10.0000 mL | Freq: Once | INTRAVENOUS | Status: AC
  Administered 2019-03-25: 10 mL
  Filled 2019-03-25: qty 10

## 2019-03-25 MED ORDER — SODIUM CHLORIDE 0.9 % IV SOLN: 5.0000 mg/kg | Freq: Once | INTRAVENOUS | Status: DC

## 2019-03-25 MED ORDER — HEPARIN SOD (PORK) LOCK FLUSH 100 UNIT/ML IV SOLN
500.0000 [IU] | Freq: Once | INTRAVENOUS | Status: AC | PRN
  Administered 2019-03-25: 500 [IU]
  Filled 2019-03-25: qty 5

## 2019-03-25 NOTE — Patient Instructions (Addendum)
Sodium Ferric Gluconate Complex injection What is this medicine? SODIUM FERRIC GLUCONATE COMPLEX (SOE dee um FER ik GLOO koe nate KOM pleks) is an iron replacement. It is used with epoetin therapy to treat low iron levels in patients who are receiving hemodialysis. This medicine may be used for other purposes; ask your health care provider or pharmacist if you have questions. COMMON BRAND NAME(S): Ferrlecit, Nulecit What should I tell my health care provider before I take this medicine? They need to know if you have any of the following conditions:  anemia that is not from iron deficiency  high levels of iron in the body  an unusual or allergic reaction to iron, benzyl alcohol, other medicines, foods, dyes, or preservatives  pregnant or are trying to become pregnant  breast-feeding How should I use this medicine? This medicine is for infusion into a vein. It is given by a health care professional in a hospital or clinic setting. Talk to your pediatrician regarding the use of this medicine in children. While this drug may be prescribed for children as young as 6 years old for selected conditions, precautions do apply. Overdosage: If you think you have taken too much of this medicine contact a poison control center or emergency room at once. NOTE: This medicine is only for you. Do not share this medicine with others. What if I miss a dose? It is important not to miss your dose. Call your doctor or health care professional if you are unable to keep an appointment. What may interact with this medicine? Do not take this medicine with any of the following medications:  deferoxamine  dimercaprol  other iron products This medicine may also interact with the following medications:  chloramphenicol  deferasirox  medicine for blood pressure like enalapril This list may not describe all possible interactions. Give your health care provider a list of all the medicines, herbs,  non-prescription drugs, or dietary supplements you use. Also tell them if you smoke, drink alcohol, or use illegal drugs. Some items may interact with your medicine. What should I watch for while using this medicine? Your condition will be monitored carefully while you are receiving this medicine. Visit your doctor for check-ups as directed. What side effects may I notice from receiving this medicine? Side effects that you should report to your doctor or health care professional as soon as possible:  allergic reactions like skin rash, itching or hives, swelling of the face, lips, or tongue  breathing problems  changes in hearing  changes in vision  chills, flushing, or sweating  fast, irregular heartbeat  feeling faint or lightheaded, falls  fever, flu-like symptoms  high or low blood pressure  pain, tingling, numbness in the hands or feet  severe pain in the chest, back, flanks, or groin  swelling of the ankles, feet, hands  trouble passing urine or change in the amount of urine  unusually weak or tired Side effects that usually do not require medical attention (report to your doctor or health care professional if they continue or are bothersome):  cramps  dark colored stools  diarrhea  headache  nausea, vomiting  stomach upset This list may not describe all possible side effects. Call your doctor for medical advice about side effects. You may report side effects to FDA at 1-800-FDA-1088. Where should I keep my medicine? This drug is given in a hospital or clinic and will not be stored at home. NOTE: This sheet is a summary. It may not cover all   possible information. If you have questions about this medicine, talk to your doctor, pharmacist, or health care provider.  2020 Elsevier/Gold Standard (2007-12-02 15:58:57)  

## 2019-03-26 ENCOUNTER — Telehealth: Payer: Self-pay | Admitting: Nurse Practitioner

## 2019-03-26 DIAGNOSIS — M1990 Unspecified osteoarthritis, unspecified site: Secondary | ICD-10-CM | POA: Diagnosis not present

## 2019-03-26 NOTE — Telephone Encounter (Signed)
Scheduled appt per 12/10 los.  Spoke with pt and she is aware of her appt dates and time

## 2019-03-29 DIAGNOSIS — M1990 Unspecified osteoarthritis, unspecified site: Secondary | ICD-10-CM | POA: Diagnosis not present

## 2019-03-30 DIAGNOSIS — M1990 Unspecified osteoarthritis, unspecified site: Secondary | ICD-10-CM | POA: Diagnosis not present

## 2019-03-31 DIAGNOSIS — M1990 Unspecified osteoarthritis, unspecified site: Secondary | ICD-10-CM | POA: Diagnosis not present

## 2019-04-01 ENCOUNTER — Inpatient Hospital Stay: Payer: Medicaid Other

## 2019-04-01 ENCOUNTER — Other Ambulatory Visit: Payer: Medicaid Other

## 2019-04-01 ENCOUNTER — Ambulatory Visit: Payer: Medicaid Other

## 2019-04-01 ENCOUNTER — Ambulatory Visit: Payer: Medicaid Other | Admitting: Nurse Practitioner

## 2019-04-01 DIAGNOSIS — M1990 Unspecified osteoarthritis, unspecified site: Secondary | ICD-10-CM | POA: Diagnosis not present

## 2019-04-02 DIAGNOSIS — M1990 Unspecified osteoarthritis, unspecified site: Secondary | ICD-10-CM | POA: Diagnosis not present

## 2019-04-05 DIAGNOSIS — M1990 Unspecified osteoarthritis, unspecified site: Secondary | ICD-10-CM | POA: Diagnosis not present

## 2019-04-06 DIAGNOSIS — M1990 Unspecified osteoarthritis, unspecified site: Secondary | ICD-10-CM | POA: Diagnosis not present

## 2019-04-07 DIAGNOSIS — M1990 Unspecified osteoarthritis, unspecified site: Secondary | ICD-10-CM | POA: Diagnosis not present

## 2019-04-08 ENCOUNTER — Inpatient Hospital Stay: Payer: Medicaid Other

## 2019-04-08 DIAGNOSIS — M1990 Unspecified osteoarthritis, unspecified site: Secondary | ICD-10-CM | POA: Diagnosis not present

## 2019-04-09 DIAGNOSIS — M1990 Unspecified osteoarthritis, unspecified site: Secondary | ICD-10-CM | POA: Diagnosis not present

## 2019-04-12 DIAGNOSIS — M1990 Unspecified osteoarthritis, unspecified site: Secondary | ICD-10-CM | POA: Diagnosis not present

## 2019-04-13 DIAGNOSIS — M1990 Unspecified osteoarthritis, unspecified site: Secondary | ICD-10-CM | POA: Diagnosis not present

## 2019-04-14 ENCOUNTER — Encounter: Payer: Self-pay | Admitting: Podiatry

## 2019-04-14 ENCOUNTER — Other Ambulatory Visit: Payer: Self-pay

## 2019-04-14 ENCOUNTER — Ambulatory Visit: Payer: Medicaid Other | Admitting: Podiatry

## 2019-04-14 DIAGNOSIS — L851 Acquired keratosis [keratoderma] palmaris et plantaris: Secondary | ICD-10-CM

## 2019-04-14 DIAGNOSIS — E1159 Type 2 diabetes mellitus with other circulatory complications: Secondary | ICD-10-CM

## 2019-04-14 DIAGNOSIS — M1990 Unspecified osteoarthritis, unspecified site: Secondary | ICD-10-CM | POA: Diagnosis not present

## 2019-04-14 NOTE — Progress Notes (Signed)
     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  She was initially seen approximately 4 weeks ago by myself and Dr. March Rummage and told to use revitaderm  and soak her feet.  She returned to the office 2 weeks ago and there was improvement in drying of the skin on the bottom  of her feet.  She presents the office today stating that she has noted no improvement in the skin on the bottom of both feet. She has pain persisting when she walks.   She presents today for continued evaluation and treatment.  Vascular  Dorsalis pedis and posterior tibial pulses are palpable  B/L.  Capillary return  WNL.  Temperature gradient is  WNL.  Skin turgor  WNL  Sensorium  Senn Weinstein monofilament wire  WNL. Normal tactile sensation.  Nail Exam  Patient has normal nails with no evidence of bacterial or fungal infection.  Orthopedic  Exam  Muscle tone and muscle strength  WNL.  No limitations of motion feet  B/L.  No crepitus or joint effusion noted.  Foot type is unremarkable and digits show no abnormalities.  Bony prominences are unremarkable.  Skin there is peeling hyperkeratotic skin noted on the heels of both feet as well as hyper keratotic skin in the forefoot of both feet.  See above and the pictures were taken on December 30.  Dermatitis  B/L  ROV.  Discussed this condition with this patient.  Told her that I had nothing new to offer in an effort to help to relieve her painful feet.  I recommended we refer  her to a dermatologist for an evaluation and treatment.  Mateo Flow was not present in the office so I will wait till next week and talk with her and find out the logistics of sending her to a dermatologist.     Gardiner Barefoot DPM

## 2019-04-15 ENCOUNTER — Inpatient Hospital Stay: Payer: Medicaid Other

## 2019-04-15 ENCOUNTER — Other Ambulatory Visit: Payer: Self-pay

## 2019-04-15 ENCOUNTER — Other Ambulatory Visit: Payer: Medicaid Other

## 2019-04-15 ENCOUNTER — Inpatient Hospital Stay (HOSPITAL_BASED_OUTPATIENT_CLINIC_OR_DEPARTMENT_OTHER): Payer: Medicaid Other | Admitting: Medical

## 2019-04-15 ENCOUNTER — Ambulatory Visit: Payer: Medicaid Other

## 2019-04-15 ENCOUNTER — Other Ambulatory Visit: Payer: Self-pay | Admitting: Hematology

## 2019-04-15 VITALS — BP 128/71 | HR 72 | Temp 98.7°F | Resp 18

## 2019-04-15 DIAGNOSIS — D500 Iron deficiency anemia secondary to blood loss (chronic): Secondary | ICD-10-CM

## 2019-04-15 DIAGNOSIS — Z95828 Presence of other vascular implants and grafts: Secondary | ICD-10-CM

## 2019-04-15 DIAGNOSIS — M1990 Unspecified osteoarthritis, unspecified site: Secondary | ICD-10-CM | POA: Diagnosis not present

## 2019-04-15 DIAGNOSIS — R197 Diarrhea, unspecified: Secondary | ICD-10-CM

## 2019-04-15 DIAGNOSIS — I780 Hereditary hemorrhagic telangiectasia: Secondary | ICD-10-CM

## 2019-04-15 DIAGNOSIS — Z5112 Encounter for antineoplastic immunotherapy: Secondary | ICD-10-CM | POA: Diagnosis not present

## 2019-04-15 LAB — CBC WITH DIFFERENTIAL (CANCER CENTER ONLY)
Abs Immature Granulocytes: 0.05 10*3/uL (ref 0.00–0.07)
Basophils Absolute: 0 10*3/uL (ref 0.0–0.1)
Basophils Relative: 0 %
Eosinophils Absolute: 0.1 10*3/uL (ref 0.0–0.5)
Eosinophils Relative: 1 %
HCT: 25.7 % — ABNORMAL LOW (ref 36.0–46.0)
Hemoglobin: 7.4 g/dL — ABNORMAL LOW (ref 12.0–15.0)
Immature Granulocytes: 0 %
Lymphocytes Relative: 14 %
Lymphs Abs: 2 10*3/uL (ref 0.7–4.0)
MCH: 23.1 pg — ABNORMAL LOW (ref 26.0–34.0)
MCHC: 28.8 g/dL — ABNORMAL LOW (ref 30.0–36.0)
MCV: 80.1 fL (ref 80.0–100.0)
Monocytes Absolute: 0.6 10*3/uL (ref 0.1–1.0)
Monocytes Relative: 4 %
Neutro Abs: 11.3 10*3/uL — ABNORMAL HIGH (ref 1.7–7.7)
Neutrophils Relative %: 81 %
Platelet Count: 338 10*3/uL (ref 150–400)
RBC: 3.21 MIL/uL — ABNORMAL LOW (ref 3.87–5.11)
RDW: 21.8 % — ABNORMAL HIGH (ref 11.5–15.5)
WBC Count: 14 10*3/uL — ABNORMAL HIGH (ref 4.0–10.5)
nRBC: 0 % (ref 0.0–0.2)

## 2019-04-15 LAB — CMP (CANCER CENTER ONLY)
ALT: 8 U/L (ref 0–44)
AST: 13 U/L — ABNORMAL LOW (ref 15–41)
Albumin: 3.6 g/dL (ref 3.5–5.0)
Alkaline Phosphatase: 83 U/L (ref 38–126)
Anion gap: 12 (ref 5–15)
BUN: 10 mg/dL (ref 6–20)
CO2: 24 mmol/L (ref 22–32)
Calcium: 9.1 mg/dL (ref 8.9–10.3)
Chloride: 104 mmol/L (ref 98–111)
Creatinine: 0.99 mg/dL (ref 0.44–1.00)
GFR, Est AFR Am: >60 mL/min (ref >60)
GFR, Estimated: >60 mL/min (ref >60)
Glucose, Bld: 128 mg/dL — ABNORMAL HIGH (ref 70–99)
Potassium: 3.2 mmol/L — ABNORMAL LOW (ref 3.5–5.1)
Sodium: 140 mmol/L (ref 135–145)
Total Bilirubin: <0.2 mg/dL — ABNORMAL LOW (ref 0.3–1.2)
Total Protein: 7.9 g/dL (ref 6.5–8.1)

## 2019-04-15 LAB — PREPARE RBC (CROSSMATCH)

## 2019-04-15 MED ORDER — SODIUM CHLORIDE 0.9 % IV SOLN
Freq: Once | INTRAVENOUS | Status: AC
  Filled 2019-04-15: qty 250

## 2019-04-15 MED ORDER — SODIUM CHLORIDE 0.9% FLUSH
10.0000 mL | INTRAVENOUS | Status: DC | PRN
  Administered 2019-04-15: 10 mL
  Filled 2019-04-15: qty 10

## 2019-04-15 MED ORDER — SODIUM CHLORIDE 0.9% FLUSH
10.0000 mL | Freq: Once | INTRAVENOUS | Status: AC
  Administered 2019-04-15: 10 mL
  Filled 2019-04-15: qty 10

## 2019-04-15 MED ORDER — SODIUM CHLORIDE 0.9 % IV SOLN
125.0000 mg | Freq: Once | INTRAVENOUS | Status: AC
  Administered 2019-04-15: 125 mg via INTRAVENOUS
  Filled 2019-04-15: qty 10

## 2019-04-15 MED ORDER — HEPARIN SOD (PORK) LOCK FLUSH 100 UNIT/ML IV SOLN
500.0000 [IU] | Freq: Once | INTRAVENOUS | Status: AC | PRN
  Administered 2019-04-15: 500 [IU]
  Filled 2019-04-15: qty 5

## 2019-04-15 MED ORDER — SODIUM CHLORIDE 0.9 % IV SOLN
5.0000 mg/kg | Freq: Once | INTRAVENOUS | Status: AC
  Administered 2019-04-15: 600 mg via INTRAVENOUS
  Filled 2019-04-15: qty 16

## 2019-04-15 MED ORDER — DIPHENOXYLATE-ATROPINE 2.5-0.025 MG PO TABS: ORAL_TABLET | ORAL | 1 refills | Status: DC

## 2019-04-15 NOTE — Patient Instructions (Signed)
[name] Discharge Instructions for Patients Receiving Chemotherapy  Today you received the following chemotherapy agents Avastin  To help prevent nausea and vomiting after your treatment, we encourage you to take your nausea medication as directed.  If you develop nausea and vomiting that is not controlled by your nausea medication, call the clinic.   BELOW ARE SYMPTOMS THAT SHOULD BE REPORTED IMMEDIATELY:  *FEVER GREATER THAN 100.5 F  *CHILLS WITH OR WITHOUT FEVER  NAUSEA AND VOMITING THAT IS NOT CONTROLLED WITH YOUR NAUSEA MEDICATION  *UNUSUAL SHORTNESS OF BREATH  *UNUSUAL BRUISING OR BLEEDING  TENDERNESS IN MOUTH AND THROAT WITH OR WITHOUT PRESENCE OF ULCERS  *URINARY PROBLEMS  *BOWEL PROBLEMS  UNUSUAL RASH Items with * indicate a potential emergency and should be followed up as soon as possible.  Feel free to call the clinic should you have any questions or concerns. The clinic phone number is [phone].  Please show the [facility] at check-in to the Emergency Department and triage nurse.  Sodium Ferric Gluconate Complex injection What is this medicine? SODIUM FERRIC GLUCONATE COMPLEX (SOE dee um FER ik GLOO koe nate KOM pleks) is an iron replacement. It is used with epoetin therapy to treat low iron levels in patients who are receiving hemodialysis. This medicine may be used for other purposes; ask your health care provider or pharmacist if you have questions. COMMON BRAND NAME(S): Ferrlecit, Nulecit What should I tell my health care provider before I take this medicine? They need to know if you have any of the following conditions:  anemia that is not from iron deficiency  high levels of iron in the body  an unusual or allergic reaction to iron, benzyl alcohol, other medicines, foods, dyes, or preservatives  pregnant or are trying to become pregnant  breast-feeding How should I use this medicine? This medicine is for infusion into  a vein. It is given by a health care professional in a hospital or clinic setting. Talk to your pediatrician regarding the use of this medicine in children. While this drug may be prescribed for children as young as 16 years old for selected conditions, precautions do apply. Overdosage: If you think you have taken too much of this medicine contact a poison control center or emergency room at once. NOTE: This medicine is only for you. Do not share this medicine with others. What if I miss a dose? It is important not to miss your dose. Call your doctor or health care professional if you are unable to keep an appointment. What may interact with this medicine? Do not take this medicine with any of the following medications:  deferoxamine  dimercaprol  other iron products This medicine may also interact with the following medications:  chloramphenicol  deferasirox  medicine for blood pressure like enalapril This list may not describe all possible interactions. Give your health care provider a list of all the medicines, herbs, non-prescription drugs, or dietary supplements you use. Also tell them if you smoke, drink alcohol, or use illegal drugs. Some items may interact with your medicine. What should I watch for while using this medicine? Your condition will be monitored carefully while you are receiving this medicine. Visit your doctor for check-ups as directed. What side effects may I notice from receiving this medicine? Side effects that you should report to your doctor or health care professional as soon as possible:  allergic reactions like skin rash, itching or hives, swelling of the face, lips, or tongue  breathing problems  changes in hearing  changes in vision  chills, flushing, or sweating  fast, irregular heartbeat  feeling faint or lightheaded, falls  fever, flu-like symptoms  high or low blood pressure  pain, tingling, numbness in the hands or feet  severe pain  in the chest, back, flanks, or groin  swelling of the ankles, feet, hands  trouble passing urine or change in the amount of urine  unusually weak or tired Side effects that usually do not require medical attention (report to your doctor or health care professional if they continue or are bothersome):  cramps  dark colored stools  diarrhea  headache  nausea, vomiting  stomach upset This list may not describe all possible side effects. Call your doctor for medical advice about side effects. You may report side effects to FDA at 1-800-FDA-1088. Where should I keep my medicine? This drug is given in a hospital or clinic and will not be stored at home. NOTE: This sheet is a summary. It may not cover all possible information. If you have questions about this medicine, talk to your doctor, pharmacist, or health care provider.  2020 Elsevier/Gold Standard (2007-12-02 15:58:57)

## 2019-04-16 ENCOUNTER — Inpatient Hospital Stay: Payer: Medicaid Other | Attending: Hematology

## 2019-04-16 DIAGNOSIS — I780 Hereditary hemorrhagic telangiectasia: Secondary | ICD-10-CM | POA: Insufficient documentation

## 2019-04-16 DIAGNOSIS — D500 Iron deficiency anemia secondary to blood loss (chronic): Secondary | ICD-10-CM

## 2019-04-16 DIAGNOSIS — Z5112 Encounter for antineoplastic immunotherapy: Secondary | ICD-10-CM | POA: Diagnosis not present

## 2019-04-16 DIAGNOSIS — M1990 Unspecified osteoarthritis, unspecified site: Secondary | ICD-10-CM | POA: Diagnosis not present

## 2019-04-16 MED ORDER — SODIUM CHLORIDE 0.9% IV SOLUTION
250.0000 mL | Freq: Once | INTRAVENOUS | Status: AC
  Administered 2019-04-16: 250 mL via INTRAVENOUS
  Filled 2019-04-16: qty 250

## 2019-04-16 MED ORDER — HEPARIN SOD (PORK) LOCK FLUSH 100 UNIT/ML IV SOLN
500.0000 [IU] | Freq: Every day | INTRAVENOUS | Status: AC | PRN
  Administered 2019-04-16: 500 [IU]
  Filled 2019-04-16: qty 5

## 2019-04-16 MED ORDER — DIPHENHYDRAMINE HCL 25 MG PO CAPS
ORAL_CAPSULE | ORAL | Status: AC
  Filled 2019-04-16: qty 1

## 2019-04-16 MED ORDER — SODIUM CHLORIDE 0.9% FLUSH
10.0000 mL | INTRAVENOUS | Status: AC | PRN
  Administered 2019-04-16: 10 mL
  Filled 2019-04-16: qty 10

## 2019-04-16 MED ORDER — ACETAMINOPHEN 325 MG PO TABS
650.0000 mg | ORAL_TABLET | Freq: Once | ORAL | Status: AC
  Administered 2019-04-16: 09:00:00 650 mg via ORAL

## 2019-04-16 MED ORDER — DIPHENHYDRAMINE HCL 25 MG PO CAPS
25.0000 mg | ORAL_CAPSULE | Freq: Once | ORAL | Status: AC
  Administered 2019-04-16: 25 mg via ORAL

## 2019-04-16 MED ORDER — ACETAMINOPHEN 325 MG PO TABS
ORAL_TABLET | ORAL | Status: AC
  Filled 2019-04-16: qty 2

## 2019-04-16 NOTE — Patient Instructions (Signed)

## 2019-04-17 LAB — BPAM RBC
Blood Product Expiration Date: 202101162359
Blood Product Expiration Date: 202101162359
ISSUE DATE / TIME: 202101010922
ISSUE DATE / TIME: 202101010922
Unit Type and Rh: 600
Unit Type and Rh: 600

## 2019-04-17 LAB — TYPE AND SCREEN
ABO/RH(D): A NEG
Antibody Screen: NEGATIVE
Unit division: 0
Unit division: 0

## 2019-04-17 NOTE — Progress Notes (Signed)
The patient was seen in the infusion room today as she was receiving treatment.  She reports having greenish, watery diarrhea.  She has not been on any antibiotics recently.  She denies any other issues of concern.  A prescription for Lomotil will be sent to her pharmacy.  Sandi Mealy, MHS, PA-C Physician Assistant

## 2019-04-19 ENCOUNTER — Other Ambulatory Visit: Payer: Self-pay | Admitting: Internal Medicine

## 2019-04-19 ENCOUNTER — Other Ambulatory Visit: Payer: Self-pay | Admitting: Hematology

## 2019-04-19 ENCOUNTER — Other Ambulatory Visit: Payer: Self-pay | Admitting: Nurse Practitioner

## 2019-04-19 DIAGNOSIS — R040 Epistaxis: Secondary | ICD-10-CM

## 2019-04-19 DIAGNOSIS — F5101 Primary insomnia: Secondary | ICD-10-CM

## 2019-04-19 DIAGNOSIS — M1990 Unspecified osteoarthritis, unspecified site: Secondary | ICD-10-CM | POA: Diagnosis not present

## 2019-04-19 LAB — IRON AND TIBC
Iron: 20 ug/dL — ABNORMAL LOW (ref 41–142)
Saturation Ratios: 7 % — ABNORMAL LOW (ref 21–57)
TIBC: 278 ug/dL (ref 236–444)
UIBC: 258 ug/dL (ref 120–384)

## 2019-04-19 LAB — FERRITIN: Ferritin: 36 ng/mL (ref 11–307)

## 2019-04-20 DIAGNOSIS — M1990 Unspecified osteoarthritis, unspecified site: Secondary | ICD-10-CM | POA: Diagnosis not present

## 2019-04-21 ENCOUNTER — Telehealth: Payer: Self-pay | Admitting: *Deleted

## 2019-04-21 DIAGNOSIS — M1990 Unspecified osteoarthritis, unspecified site: Secondary | ICD-10-CM | POA: Diagnosis not present

## 2019-04-21 DIAGNOSIS — E1159 Type 2 diabetes mellitus with other circulatory complications: Secondary | ICD-10-CM

## 2019-04-21 DIAGNOSIS — L851 Acquired keratosis [keratoderma] palmaris et plantaris: Secondary | ICD-10-CM

## 2019-04-21 DIAGNOSIS — M79675 Pain in left toe(s): Secondary | ICD-10-CM

## 2019-04-21 DIAGNOSIS — B351 Tinea unguium: Secondary | ICD-10-CM

## 2019-04-21 NOTE — Telephone Encounter (Signed)
Faxed referral noted to appoint to [facility] facility, clinicals and demographics to [place].

## 2019-04-21 NOTE — Progress Notes (Signed)
[place]   [phone] [phone]   Clinic Follow up Note   Patient Care Team: [name], MD as PCP - General (Internal Medicine)  Date of Service:  04/22/2019  CHIEF COMPLAINT: F/uof HHT andAnemia    PREVIOUS THERAPY:  -Multiple EGD, small bowel enteroscope, C-scope with APC -Oral and iv amicar were given during hospital stay, no effect -IR embolization ofof the left gastric and short gastric artery branch vessels without evidence of residual flow in the Dieulafoy lesionon 12/10/17 -Avastin on 8/2 and 8/30 at [facility]; starting 12/26/17 continue 5mg /kg q2 weeksuntil 02/20/18, restarted on 04/03/2018  CURRENT THERAPY: -Tamoxifen started in 10/2017 for HHTstopped 12/10/18 due toDVT -Blood transfusion for severe anemia secondary to GI bleedingas needed -ivferric gluconate every1-2 weeks as neededwith ferritin goal 100-200 -RestartedAvastin q2weeks on 04/03/18 -Amicar 1g bidstarting4/06/2018. Reduced to only as needed on 12/10/18 due toDVT  INTERVAL HISTORY:  Peggy House is here for a follow up of anemia and HHT. She presents to the clinic alone. She notes her BMs have slowed down and she denies any bleeding although her Hg was low. She did receive blood transfusion on 1/5. She notes she does still feel tired. She notes she forgot an appointment in 03/2019.  She notes she does still have epistaxis nightly for 30 minutes. She has not needed amicar. I reviewed her medication list. She notes she does not need Tramadol much although she will have knee pain or back pain. She does take Tylenol. She notes her appetite has decreased lately and lost a few pounds. She also notes recent nasal congestion only.    REVIEW OF SYSTEMS:   Constitutional: Denies fevers, chills (+) Lower appetite, mild weight loss (+) Fatigue  Eyes: Denies blurriness of vision Ears, nose, mouth, throat, and face: Denies mucositis or sore throat (+) Epistaxis  nightly (+) Nasal congestion Respiratory: Denies cough, dyspnea or wheezes Cardiovascular: Denies palpitation, chest discomfort or lower extremity swelling Gastrointestinal:  Denies nausea, heartburn or change in bowel habits Skin: Denies abnormal skin rashes MSK: (+) Knee pain and back pain, arthritis  Lymphatics: Denies new lymphadenopathy or easy bruising Neurological:Denies numbness, tingling or new weaknesses Behavioral/Psych: Mood is stable, no new changes  All other systems were reviewed with the patient and are negative.  MEDICAL HISTORY:  Past Medical History:  Diagnosis Date  . Anxiety   . Arthritis    knnes,back  . GERD (gastroesophageal reflux disease)   . 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 History of swelling of feet   . Hyperlipidemia   . Hypertension   . Major depressive disorder, recurrent episode ([place]) 06/05/2015  . Obesity   . Snores   . 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 ABDOMINAL HYSTERECTOMY    . CARPAL TUNNEL RELEASE  05/13/2011   Procedure: CARPAL TUNNEL RELEASE;  Surgeon: Nita Sells, MD;  Location: [place];  Service: Orthopedics;  Laterality: Left;  . COLONOSCOPY WITH PROPOFOL N/A 04/28/2014   Procedure: COLONOSCOPY WITH PROPOFOL;  Surgeon: Cleotis Nipper, MD;  Location: [facility] ENDOSCOPY;  Service: Endoscopy;  Laterality: N/A;  . DG TOES*L*  2/10   rt  . DILATION AND CURETTAGE OF UTERUS    . ENTEROSCOPY N/A 10/17/2017   Procedure: ENTEROSCOPY;  Surgeon: Otis Brace, MD;  Location: [place] ENDOSCOPY;  Service: Gastroenterology;  Laterality: N/A;  . ESOPHAGOGASTRODUODENOSCOPY N/A 04/10/2014   Procedure:  ESOPHAGOGASTRODUODENOSCOPY (EGD);  Surgeon: [name], MD;  Location: [facility] ENDOSCOPY;  Service: Endoscopy;  Laterality: N/A;  . ESOPHAGOGASTRODUODENOSCOPY N/A 05/10/2017   Procedure:  ESOPHAGOGASTRODUODENOSCOPY (EGD);  Surgeon: Ronald Lobo, MD;  Location: [facility] ENDOSCOPY;  Service: Endoscopy;  Laterality: N/A;  . ESOPHAGOGASTRODUODENOSCOPY N/A 09/22/2017   Procedure: ESOPHAGOGASTRODUODENOSCOPY (EGD);  Surgeon: Clarene Essex, MD;  Location: [place];  Service: Endoscopy;  Laterality: N/A;  bedside  . ESOPHAGOGASTRODUODENOSCOPY (EGD) WITH PROPOFOL N/A 04/27/2014   Procedure: ESOPHAGOGASTRODUODENOSCOPY (EGD) WITH PROPOFOL;  Surgeon: Cleotis Nipper, MD;  Location: [place];  Service: Endoscopy;  Laterality: N/A;  possible apc  . ESOPHAGOGASTRODUODENOSCOPY (EGD) WITH PROPOFOL N/A 09/30/2017   Procedure: ESOPHAGOGASTRODUODENOSCOPY (EGD) WITH PROPOFOL;  Surgeon: [name], MD;  Location: [place];  Service: Gastroenterology;  Laterality: N/A;  . ESOPHAGOGASTRODUODENOSCOPY (EGD) WITH PROPOFOL N/A 10/01/2017   Procedure: ESOPHAGOGASTRODUODENOSCOPY (EGD) WITH PROPOFOL;  Surgeon: [name], MD;  Location: [place];  Service: Gastroenterology;  Laterality: N/A;  . ESOPHAGOGASTRODUODENOSCOPY (EGD) WITH PROPOFOL N/A 10/08/2017   Procedure: ESOPHAGOGASTRODUODENOSCOPY (EGD) WITH PROPOFOL;  Surgeon: Otis Brace, MD;  Location: [facility] ENDOSCOPY;  Service: Gastroenterology;  Laterality: N/A;  . ESOPHAGOGASTRODUODENOSCOPY (EGD) WITH PROPOFOL N/A 10/17/2017   Procedure: ESOPHAGOGASTRODUODENOSCOPY (EGD) WITH PROPOFOL;  Surgeon: Otis Brace, MD;  Location: [facility] ENDOSCOPY;  Service: Gastroenterology;  Laterality: N/A;  . ESOPHAGOGASTRODUODENOSCOPY (EGD) WITH PROPOFOL N/A 10/19/2017   Procedure: ESOPHAGOGASTRODUODENOSCOPY (EGD) WITH PROPOFOL;  Surgeon: Otis Brace, MD;  Location: [facility] ENDOSCOPY;  Service: Gastroenterology;  Laterality: N/A;  . ESOPHAGOGASTRODUODENOSCOPY (EGD) WITH PROPOFOL N/A 12/04/2018   Procedure: ESOPHAGOGASTRODUODENOSCOPY (EGD) WITH PROPOFOL;  Surgeon: Wilford Corner, MD;  Location: [facility] ENDOSCOPY;  Service: Endoscopy;  Laterality: N/A;  . GIVENS CAPSULE STUDY  N/A 10/02/2017   Procedure: GIVENS CAPSULE STUDY;  Surgeon: [name], MD;  Location: [place];  Service: Gastroenterology;  Laterality: N/A;  . GIVENS CAPSULE STUDY N/A 10/08/2017   Procedure: GIVENS CAPSULE STUDY;  Surgeon: Otis Brace, MD;  Location: [place];  Service: Gastroenterology;  Laterality: N/A;  endoscopic placement of capsule  . HEMOSTASIS CLIP PLACEMENT  12/04/2018   Procedure: HEMOSTASIS CLIP PLACEMENT;  Surgeon: Wilford Corner, MD;  Location: [facility] ENDOSCOPY;  Service: Endoscopy;;  . HOT HEMOSTASIS N/A 04/27/2014   Procedure: HOT HEMOSTASIS (ARGON PLASMA COAGULATION/BICAP);  Surgeon: Cleotis Nipper, MD;  Location: [facility] ENDOSCOPY;  Service: Endoscopy;  Laterality: N/A;  . HOT HEMOSTASIS N/A 09/30/2017   Procedure: HOT HEMOSTASIS (ARGON PLASMA COAGULATION/BICAP);  Surgeon: [name], MD;  Location: [place];  Service: Gastroenterology;  Laterality: N/A;  . HOT HEMOSTASIS N/A 10/01/2017   Procedure: HOT HEMOSTASIS (ARGON PLASMA COAGULATION/BICAP);  Surgeon: [name], MD;  Location: [place];  Service: Gastroenterology;  Laterality: N/A;  . HOT HEMOSTASIS N/A 10/17/2017   Procedure: HOT HEMOSTASIS (ARGON PLASMA COAGULATION/BICAP);  Surgeon: Otis Brace, MD;  Location: [facility] ENDOSCOPY;  Service: Gastroenterology;  Laterality: N/A;  . HOT HEMOSTASIS N/A 10/19/2017   Procedure: HOT HEMOSTASIS (ARGON PLASMA COAGULATION/BICAP);  Surgeon: Otis Brace, MD;  Location: [facility] ENDOSCOPY;  Service: Gastroenterology;  Laterality: N/A;  . IR IMAGING GUIDED PORT INSERTION  07/08/2018  . L shoulder Surgery  2011  . SUBMUCOSAL INJECTION  09/22/2017   Procedure: SUBMUCOSAL INJECTION;  Surgeon: Clarene Essex, MD;  Location: [facility] ENDOSCOPY;  Service: Endoscopy;;  . SUBMUCOSAL INJECTION  12/04/2018   Procedure: SUBMUCOSAL INJECTION;  Surgeon: Wilford Corner, MD;  Location: [facility] ENDOSCOPY;  Service: Endoscopy;;    I have reviewed the social history and family history with the  patient and they are unchanged  from previous note.  ALLERGIES:  is allergic to feraheme [ferumoxytol]; nsaids; tomato; and wasp venom.  MEDICATIONS:  Current Outpatient Medications  Medication Sig Dispense Refill  . albuterol (PROVENTIL HFA;VENTOLIN HFA) 108 (90 Base) MCG/ACT inhaler Inhale 1-2 puffs into the lungs every 6 (six) hours as needed for wheezing or shortness of breath. 1 Inhaler 3  . ALPRAZolam (XANAX) 0.5 MG tablet TAKE 1 TABLET BY MOUTH 2 TIMES A DAY AS NEEDED FOR ANXIETY 60 tablet 0  . Aminocaproic Acid 1000 MG TABS TAKE 1 TABLET(1000 MG) BY MOUTH TWICE DAILY 60 tablet 1  . amLODipine (NORVASC) 10 MG tablet TAKE 1 TABLET(10 MG) BY MOUTH DAILY 90 tablet 0  . atorvastatin (LIPITOR) 80 MG tablet TAKE 1 TABLET(80 MG) BY MOUTH DAILY 30 tablet 3  . cetirizine (ZYRTEC ALLERGY) 10 MG tablet Take 1 tablet (10 mg total) by mouth daily. 30 tablet 2  . diphenhydramine-acetaminophen (TYLENOL PM) 25-500 MG TABS tablet Take 1 tablet by mouth at bedtime as needed (sleep).    . diphenoxylate-atropine (LOMOTIL) 2.5-0.025 MG tablet 1 to 2 PO QID prn diarrhea 30 tablet 1  . guaifenesin (ROBITUSSIN) 100 MG/5ML syrup Take 200 mg by mouth 3 (three) times daily as needed for cough or congestion.    . hydrochlorothiazide (HYDRODIURIL) 12.5 MG tablet TAKE 1 TABLET(12.5 MG) BY MOUTH DAILY 90 tablet 0  . lidocaine-prilocaine (EMLA) cream Apply 1 application topically as needed. 30 g 0  . metFORMIN (GLUCOPHAGE) 500 MG tablet TAKE 1 TABLET(500 MG) BY MOUTH TWICE DAILY WITH A MEAL 180 tablet 1  . pantoprazole (PROTONIX) 40 MG tablet Take 1 tablet (40 mg total) by mouth 2 (two) times daily. 60 tablet 5  . potassium chloride (KLOR-CON) 20 MEQ packet Take 20 mEq by mouth daily. 15 packet 0  . prochlorperazine (COMPAZINE) 10 MG tablet Take 1 tablet (10 mg total) by mouth every 6 (six) hours as needed for nausea or vomiting. 30 tablet 1  . sertraline (ZOLOFT) 100 MG tablet Take 1 tablet (100 mg total) by mouth  daily. 90 tablet 0  . traZODone (DESYREL) 50 MG tablet TAKE 1 TABLET(50 MG) BY MOUTH AT BEDTIME 30 tablet 2   No current facility-administered medications for this visit.    PHYSICAL EXAMINATION: ECOG PERFORMANCE STATUS: 1 - Symptomatic but completely ambulatory  Vitals:   04/22/19 1359  BP: (!) 145/80  Pulse: 68  Resp: 20  Temp: 97.8 F (36.6 C)  SpO2: 100%   Filed Weights   04/22/19 1359  Weight: 262 lb 14.4 oz (119.3 kg)    Due to COVID19 we will limit examination to appearance. Patient had no complaints.  GENERAL:alert, no distress and comfortable SKIN: skin color normal, no rashes or significant lesions EYES: normal, Conjunctiva are pink and non-injected, sclera clear  NEURO: alert & oriented x 3 with fluent speech   LABORATORY DATA:  I have reviewed the data as listed CBC Latest Ref Rng & Units 04/22/2019 04/15/2019 03/25/2019  WBC 4.0 - 10.5 K/uL 12.8(H) 14.0(H) 9.7  Hemoglobin 12.0 - 15.0 g/dL 9.1(L) 7.4(L) 8.2(L)  Hematocrit 36.0 - 46.0 % 31.1(L) 25.7(L) 27.8(L)  Platelets 150 - 400 K/uL 291 338 264     CMP Latest Ref Rng & Units 04/15/2019 03/08/2019 02/18/2019  Glucose 70 - 99 mg/dL 128(H) 121(H) 295(H)  BUN 6 - 20 mg/dL 10 10 9   Creatinine 0.44 - 1.00 mg/dL 0.99 0.87 0.97  Sodium 135 - 145 mmol/L 140 142 137  Potassium 3.5 - 5.1 mmol/L 3.2(L)  3.7 3.4(L)  Chloride 98 - 111 mmol/L 104 108 103  CO2 22 - 32 mmol/L 24 24 23   Calcium 8.9 - 10.3 mg/dL 9.1 9.0 8.8(L)  Total Protein 6.5 - 8.1 g/dL 7.9 7.1 7.3  Total Bilirubin 0.3 - 1.2 mg/dL <0.2(L) 0.3 0.2(L)  Alkaline Phos 38 - 126 U/L 83 67 88  AST 15 - 41 U/L 13(L) 15 11(L)  ALT 0 - 44 U/L 8 10 7       RADIOGRAPHIC STUDIES: I have personally reviewed the radiological images as listed and agreed with the findings in the report. No results found.   ASSESSMENT & PLAN:  Peggy House is a 59 y.o. female with   1. 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 Extensive workup found the pt to have HHT based on her personal and familyhistoryand recurrentAVM GI bleedings. -Sheis s/pIR embolization of the left Gastric and short gastric artery branchvesselswithout evidence of residual flow in the Dieulafoy lesionon 12/10/2017. -Shereceived[facility] and then every 2 weeks9/13/19-11/8/19 -Shewas treated with Tamoxifen and Amicar for her HHT, but due to recent DVT, tamoxifen was stopped and she only takes Amicar as needed for severe epistaxis. -Her primary treatment will now be Avastin and IV Iron and blood transfusions as needed.We repeatedly discussed consistency in treatments to manage her blood counts, she voices good understanding.  -She is mostly stable with fatigue. She has lowered appetite that lead to mild weight loss. I recommend she use nutritional supplement to help. She denies current GI bleeding and still has moderate epistaxis.  -Labs reviewed, Hg 9.1, iron panel still pending. Proceed with IVferric gluconate today and IV ferric Gluconate and Avastin next week.  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  -EGDfrom 8/21/20showeda dieulafoy lesion which was clipped and injected, large amount blood found -We will follow her with monthly labs and IV ironand blood transfusionsas needed. Last Blood transfusion was 04/20/19.   3. Reactive Leukocytosis and reactive thrombocytosis  -She has mild  leukocytosis with dominant neutrophils, likely reactive -Thrombocytosis recently resolved. Leukocytosis intermittent. Continue monitoring. Mildly improved.   4. 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 -I recommend she continue to follow up with her GI, Dr. Cristina Gong -She had severe GI bleeding which required hospitalization in 11/2018 with several blood transfusions and EGD to cauterize her stomach bleeding. -I started her on Amicar 07/17/18. Given recent DVT she will only take Amicar for severe epistaxis -Her GI bleeding has resolved currently.But has been having almost nightly epistaxis manageable. She has not needed Amicar.   5.History of right Arm DVT -She had right arm DVT that extended to her right clavicleon 12/07/2018 when she was hospitalized for GI bleeding.It is possibly related to Amicar, tamoxifen and port. -She is not a candidate for anticoagulation due to severe GI bleeding -We stopped Tamoxifen, she will only use Amicar for severe epistaxis. Will maintain Port flushes.  6. Smoking cessation -She was smoking 10 cigarettes a day on average before  recent hospitalization -I strongly encouraged her to stop smoking completely, she will try.  7. Hypokalemia -Onpotassium chloride 20 mEq daily, continue. Willmonitor  8. Hyperglycemia, new onset diabetes -I previously advised her to stop eating sweats and reduce her carbohydrate intake.  -I started her on Metformin BID on 02/18/19 to manage this. Continue to f/u with PCP to monitor her A1c and hyperglycemia  9. HTN, uncontrolled  -Her BP has been intermittently elevated in clinic lately.  -I discussed Avastin can increase her BP. Will continue to monitor.  -I strongly encouraged her to keep up with her HTN medications daily to manage this. She understands.  -improved lately    PLAN: -Labs reviewed and adequate to  proceed with Ferric Gluconate today  -Lab, flush, Avastin and ferric gluconate in 1, 3, 5 weeks (if Hg<9.0, will add lab next week and hold iron if ferritin >200 on last lab) -F/u in 5 weeks    No problem-specific Assessment & Plan notes found for this encounter.   No orders of the defined types were placed in this encounter.  All questions were answered. The patient knows to call the clinic with any problems, questions or concerns. No barriers to learning was detected. The total time spent in the appointment was 25 minutes.     Truitt Merle, MD 04/22/2019   I, Joslyn Devon, am acting as scribe for Truitt Merle, MD.   I have reviewed the above documentation for accuracy and completeness, and I agree with the above.

## 2019-04-22 ENCOUNTER — Inpatient Hospital Stay: Payer: Medicaid Other

## 2019-04-22 ENCOUNTER — Other Ambulatory Visit: Payer: Self-pay

## 2019-04-22 ENCOUNTER — Other Ambulatory Visit: Payer: Self-pay | Admitting: Emergency Medicine

## 2019-04-22 ENCOUNTER — Inpatient Hospital Stay (HOSPITAL_BASED_OUTPATIENT_CLINIC_OR_DEPARTMENT_OTHER): Payer: Medicaid Other | Admitting: Hematology

## 2019-04-22 ENCOUNTER — Encounter: Payer: Self-pay | Admitting: Hematology

## 2019-04-22 VITALS — BP 145/80 | HR 68 | Temp 97.8°F | Resp 20 | Ht 65.5 in | Wt 262.9 lb

## 2019-04-22 DIAGNOSIS — D500 Iron deficiency anemia secondary to blood loss (chronic): Secondary | ICD-10-CM | POA: Diagnosis not present

## 2019-04-22 DIAGNOSIS — I780 Hereditary hemorrhagic telangiectasia: Secondary | ICD-10-CM

## 2019-04-22 DIAGNOSIS — Z95828 Presence of other vascular implants and grafts: Secondary | ICD-10-CM

## 2019-04-22 DIAGNOSIS — M1990 Unspecified osteoarthritis, unspecified site: Secondary | ICD-10-CM | POA: Diagnosis not present

## 2019-04-22 DIAGNOSIS — Z5112 Encounter for antineoplastic immunotherapy: Secondary | ICD-10-CM | POA: Diagnosis not present

## 2019-04-22 LAB — CBC WITH DIFFERENTIAL (CANCER CENTER ONLY)
Abs Immature Granulocytes: 0.05 10*3/uL (ref 0.00–0.07)
Basophils Absolute: 0.1 10*3/uL (ref 0.0–0.1)
Basophils Relative: 0 %
Eosinophils Absolute: 0.1 10*3/uL (ref 0.0–0.5)
Eosinophils Relative: 1 %
HCT: 31.1 % — ABNORMAL LOW (ref 36.0–46.0)
Hemoglobin: 9.1 g/dL — ABNORMAL LOW (ref 12.0–15.0)
Immature Granulocytes: 0 %
Lymphocytes Relative: 13 %
Lymphs Abs: 1.6 10*3/uL (ref 0.7–4.0)
MCH: 24.5 pg — ABNORMAL LOW (ref 26.0–34.0)
MCHC: 29.3 g/dL — ABNORMAL LOW (ref 30.0–36.0)
MCV: 83.8 fL (ref 80.0–100.0)
Monocytes Absolute: 0.5 10*3/uL (ref 0.1–1.0)
Monocytes Relative: 4 %
Neutro Abs: 10.5 10*3/uL — ABNORMAL HIGH (ref 1.7–7.7)
Neutrophils Relative %: 82 %
Platelet Count: 291 10*3/uL (ref 150–400)
RBC: 3.71 MIL/uL — ABNORMAL LOW (ref 3.87–5.11)
RDW: 22.5 % — ABNORMAL HIGH (ref 11.5–15.5)
WBC Count: 12.8 10*3/uL — ABNORMAL HIGH (ref 4.0–10.5)
nRBC: 0 % (ref 0.0–0.2)

## 2019-04-22 LAB — IRON AND TIBC
Iron: 26 ug/dL — ABNORMAL LOW (ref 41–142)
Saturation Ratios: 9 % — ABNORMAL LOW (ref 21–57)
TIBC: 278 ug/dL (ref 236–444)
UIBC: 252 ug/dL (ref 120–384)

## 2019-04-22 LAB — FERRITIN: Ferritin: 87 ng/mL (ref 11–307)

## 2019-04-22 MED ORDER — SODIUM CHLORIDE 0.9 % IV SOLN
125.0000 mg | Freq: Once | INTRAVENOUS | Status: AC
  Administered 2019-04-22: 125 mg via INTRAVENOUS
  Filled 2019-04-22: qty 10

## 2019-04-22 MED ORDER — SODIUM CHLORIDE 0.9% FLUSH
10.0000 mL | Freq: Once | INTRAVENOUS | Status: AC
  Administered 2019-04-22: 10 mL
  Filled 2019-04-22: qty 10

## 2019-04-22 MED ORDER — SODIUM CHLORIDE 0.9 % IV SOLN
Freq: Once | INTRAVENOUS | Status: AC
  Filled 2019-04-22: qty 250

## 2019-04-22 MED ORDER — HEPARIN SOD (PORK) LOCK FLUSH 100 UNIT/ML IV SOLN
500.0000 [IU] | Freq: Once | INTRAVENOUS | Status: AC
  Administered 2019-04-22: 500 [IU]
  Filled 2019-04-22: qty 5

## 2019-04-22 NOTE — Patient Instructions (Signed)
Sodium Ferric Gluconate Complex injection What is this medicine? SODIUM FERRIC GLUCONATE COMPLEX (SOE dee um FER ik GLOO koe nate KOM pleks) is an iron replacement. It is used with epoetin therapy to treat low iron levels in patients who are receiving hemodialysis. This medicine may be used for other purposes; ask your health care provider or pharmacist if you have questions. COMMON BRAND NAME(S): Ferrlecit, Nulecit What should I tell my health care provider before I take this medicine? They need to know if you have any of the following conditions:  anemia that is not from iron deficiency  high levels of iron in the body  an unusual or allergic reaction to iron, benzyl alcohol, other medicines, foods, dyes, or preservatives  pregnant or are trying to become pregnant  breast-feeding How should I use this medicine? This medicine is for infusion into a vein. It is given by a health care professional in a hospital or clinic setting. Talk to your pediatrician regarding the use of this medicine in children. While this drug may be prescribed for children as young as 6 years old for selected conditions, precautions do apply. Overdosage: If you think you have taken too much of this medicine contact a poison control center or emergency room at once. NOTE: This medicine is only for you. Do not share this medicine with others. What if I miss a dose? It is important not to miss your dose. Call your doctor or health care professional if you are unable to keep an appointment. What may interact with this medicine? Do not take this medicine with any of the following medications:  deferoxamine  dimercaprol  other iron products This medicine may also interact with the following medications:  chloramphenicol  deferasirox  medicine for blood pressure like enalapril This list may not describe all possible interactions. Give your health care provider a list of all the medicines, herbs,  non-prescription drugs, or dietary supplements you use. Also tell them if you smoke, drink alcohol, or use illegal drugs. Some items may interact with your medicine. What should I watch for while using this medicine? Your condition will be monitored carefully while you are receiving this medicine. Visit your doctor for check-ups as directed. What side effects may I notice from receiving this medicine? Side effects that you should report to your doctor or health care professional as soon as possible:  allergic reactions like skin rash, itching or hives, swelling of the face, lips, or tongue  breathing problems  changes in hearing  changes in vision  chills, flushing, or sweating  fast, irregular heartbeat  feeling faint or lightheaded, falls  fever, flu-like symptoms  high or low blood pressure  pain, tingling, numbness in the hands or feet  severe pain in the chest, back, flanks, or groin  swelling of the ankles, feet, hands  trouble passing urine or change in the amount of urine  unusually weak or tired Side effects that usually do not require medical attention (report to your doctor or health care professional if they continue or are bothersome):  cramps  dark colored stools  diarrhea  headache  nausea, vomiting  stomach upset This list may not describe all possible side effects. Call your doctor for medical advice about side effects. You may report side effects to FDA at 1-800-FDA-1088. Where should I keep my medicine? This drug is given in a hospital or clinic and will not be stored at home. NOTE: This sheet is a summary. It may not cover all   possible information. If you have questions about this medicine, talk to your doctor, pharmacist, or health care provider.  2020 Elsevier/Gold Standard (2007-12-02 15:58:57)  

## 2019-04-23 ENCOUNTER — Telehealth: Payer: Self-pay | Admitting: Hematology

## 2019-04-23 DIAGNOSIS — M1990 Unspecified osteoarthritis, unspecified site: Secondary | ICD-10-CM | POA: Diagnosis not present

## 2019-04-23 NOTE — Telephone Encounter (Signed)
Scheduled appt per 1/7 los.  Left a vm of the appt date and time.

## 2019-04-26 DIAGNOSIS — M1990 Unspecified osteoarthritis, unspecified site: Secondary | ICD-10-CM | POA: Diagnosis not present

## 2019-04-27 ENCOUNTER — Other Ambulatory Visit: Payer: Self-pay | Admitting: Nurse Practitioner

## 2019-04-27 DIAGNOSIS — M1990 Unspecified osteoarthritis, unspecified site: Secondary | ICD-10-CM | POA: Diagnosis not present

## 2019-04-27 MED ORDER — ALPRAZOLAM 0.5 MG PO TABS: ORAL_TABLET | ORAL | 0 refills | Status: DC

## 2019-04-28 DIAGNOSIS — M1990 Unspecified osteoarthritis, unspecified site: Secondary | ICD-10-CM | POA: Diagnosis not present

## 2019-04-29 ENCOUNTER — Inpatient Hospital Stay: Payer: Medicaid Other

## 2019-04-29 DIAGNOSIS — M1990 Unspecified osteoarthritis, unspecified site: Secondary | ICD-10-CM | POA: Diagnosis not present

## 2019-04-30 DIAGNOSIS — M1990 Unspecified osteoarthritis, unspecified site: Secondary | ICD-10-CM | POA: Diagnosis not present

## 2019-05-03 DIAGNOSIS — M1990 Unspecified osteoarthritis, unspecified site: Secondary | ICD-10-CM | POA: Diagnosis not present

## 2019-05-04 DIAGNOSIS — M1990 Unspecified osteoarthritis, unspecified site: Secondary | ICD-10-CM | POA: Diagnosis not present

## 2019-05-05 DIAGNOSIS — M1990 Unspecified osteoarthritis, unspecified site: Secondary | ICD-10-CM | POA: Diagnosis not present

## 2019-05-06 ENCOUNTER — Telehealth: Payer: Self-pay

## 2019-05-06 ENCOUNTER — Ambulatory Visit: Payer: Medicaid Other

## 2019-05-06 ENCOUNTER — Other Ambulatory Visit: Payer: Medicaid Other

## 2019-05-06 DIAGNOSIS — M1990 Unspecified osteoarthritis, unspecified site: Secondary | ICD-10-CM | POA: Diagnosis not present

## 2019-05-06 NOTE — Telephone Encounter (Signed)
Patient calls with sudden loss of smell and taste, informed her she must get tested for Covid-19 either at the [facility] or one of the local pharmacies.  She verbalized an understanding and states she will do so.

## 2019-05-07 DIAGNOSIS — M1990 Unspecified osteoarthritis, unspecified site: Secondary | ICD-10-CM | POA: Diagnosis not present

## 2019-05-09 DIAGNOSIS — Z20828 Contact with and (suspected) exposure to other viral communicable diseases: Secondary | ICD-10-CM | POA: Diagnosis not present

## 2019-05-10 DIAGNOSIS — M1990 Unspecified osteoarthritis, unspecified site: Secondary | ICD-10-CM | POA: Diagnosis not present

## 2019-05-11 DIAGNOSIS — M1990 Unspecified osteoarthritis, unspecified site: Secondary | ICD-10-CM | POA: Diagnosis not present

## 2019-05-12 DIAGNOSIS — M1990 Unspecified osteoarthritis, unspecified site: Secondary | ICD-10-CM | POA: Diagnosis not present

## 2019-05-13 ENCOUNTER — Inpatient Hospital Stay: Payer: Medicaid Other

## 2019-05-13 ENCOUNTER — Other Ambulatory Visit: Payer: Self-pay

## 2019-05-13 VITALS — BP 133/64 | HR 72 | Temp 97.8°F | Resp 18

## 2019-05-13 DIAGNOSIS — I780 Hereditary hemorrhagic telangiectasia: Secondary | ICD-10-CM

## 2019-05-13 DIAGNOSIS — D500 Iron deficiency anemia secondary to blood loss (chronic): Secondary | ICD-10-CM

## 2019-05-13 DIAGNOSIS — Z95828 Presence of other vascular implants and grafts: Secondary | ICD-10-CM

## 2019-05-13 DIAGNOSIS — Z5112 Encounter for antineoplastic immunotherapy: Secondary | ICD-10-CM | POA: Diagnosis not present

## 2019-05-13 DIAGNOSIS — M1990 Unspecified osteoarthritis, unspecified site: Secondary | ICD-10-CM | POA: Diagnosis not present

## 2019-05-13 LAB — CBC WITH DIFFERENTIAL (CANCER CENTER ONLY)
Abs Immature Granulocytes: 0.05 10*3/uL (ref 0.00–0.07)
Basophils Absolute: 0 10*3/uL (ref 0.0–0.1)
Basophils Relative: 0 %
Eosinophils Absolute: 0.1 10*3/uL (ref 0.0–0.5)
Eosinophils Relative: 1 %
HCT: 30.2 % — ABNORMAL LOW (ref 36.0–46.0)
Hemoglobin: 8.9 g/dL — ABNORMAL LOW (ref 12.0–15.0)
Immature Granulocytes: 0 %
Lymphocytes Relative: 12 %
Lymphs Abs: 1.4 10*3/uL (ref 0.7–4.0)
MCH: 24.4 pg — ABNORMAL LOW (ref 26.0–34.0)
MCHC: 29.5 g/dL — ABNORMAL LOW (ref 30.0–36.0)
MCV: 82.7 fL (ref 80.0–100.0)
Monocytes Absolute: 0.5 10*3/uL (ref 0.1–1.0)
Monocytes Relative: 4 %
Neutro Abs: 9.3 10*3/uL — ABNORMAL HIGH (ref 1.7–7.7)
Neutrophils Relative %: 83 %
Platelet Count: 233 10*3/uL (ref 150–400)
RBC: 3.65 MIL/uL — ABNORMAL LOW (ref 3.87–5.11)
RDW: 22.1 % — ABNORMAL HIGH (ref 11.5–15.5)
WBC Count: 11.3 10*3/uL — ABNORMAL HIGH (ref 4.0–10.5)
nRBC: 0 % (ref 0.0–0.2)

## 2019-05-13 LAB — SAMPLE TO BLOOD BANK

## 2019-05-13 LAB — IRON AND TIBC
Iron: 30 ug/dL — ABNORMAL LOW (ref 41–142)
Saturation Ratios: 11 % — ABNORMAL LOW (ref 21–57)
TIBC: 276 ug/dL (ref 236–444)
UIBC: 246 ug/dL (ref 120–384)

## 2019-05-13 LAB — TOTAL PROTEIN, URINE DIPSTICK: Protein, ur: 30 mg/dL — AB

## 2019-05-13 LAB — FERRITIN: Ferritin: 35 ng/mL (ref 11–307)

## 2019-05-13 MED ORDER — SODIUM CHLORIDE 0.9% FLUSH
10.0000 mL | INTRAVENOUS | Status: DC | PRN
  Administered 2019-05-13: 10 mL
  Filled 2019-05-13: qty 10

## 2019-05-13 MED ORDER — HEPARIN SOD (PORK) LOCK FLUSH 100 UNIT/ML IV SOLN
500.0000 [IU] | Freq: Once | INTRAVENOUS | Status: AC | PRN
  Administered 2019-05-13: 500 [IU]
  Filled 2019-05-13: qty 5

## 2019-05-13 MED ORDER — SODIUM CHLORIDE 0.9 % IV SOLN
5.0000 mg/kg | Freq: Once | INTRAVENOUS | Status: AC
  Administered 2019-05-13: 14:00:00 600 mg via INTRAVENOUS
  Filled 2019-05-13: qty 16

## 2019-05-13 MED ORDER — SODIUM CHLORIDE 0.9% FLUSH
10.0000 mL | Freq: Once | INTRAVENOUS | Status: AC
  Administered 2019-05-13: 10 mL
  Filled 2019-05-13: qty 10

## 2019-05-13 MED ORDER — SODIUM CHLORIDE 0.9 % IV SOLN
125.0000 mg | Freq: Once | INTRAVENOUS | Status: AC
  Administered 2019-05-13: 125 mg via INTRAVENOUS
  Filled 2019-05-13: qty 10

## 2019-05-13 MED ORDER — SODIUM CHLORIDE 0.9 % IV SOLN
Freq: Once | INTRAVENOUS | Status: AC
  Filled 2019-05-13: qty 250

## 2019-05-13 NOTE — Patient Instructions (Signed)
[facility]charge Instructions for Patients Receiving Chemotherapy  Today you received the following chemotherapy agents Bevacizumab   To help prevent nausea and vomiting after your treatment, we encourage you to take your nausea medication as directed.    If you develop nausea and vomiting that is not controlled by your nausea medication, call the clinic.   BELOW ARE SYMPTOMS THAT SHOULD BE REPORTED IMMEDIATELY:  *FEVER GREATER THAN 100.5 F  *CHILLS WITH OR WITHOUT FEVER  NAUSEA AND VOMITING THAT IS NOT CONTROLLED WITH YOUR NAUSEA MEDICATION  *UNUSUAL SHORTNESS OF BREATH  *UNUSUAL BRUISING OR BLEEDING  TENDERNESS IN MOUTH AND THROAT WITH OR WITHOUT PRESENCE OF ULCERS  *URINARY PROBLEMS  *BOWEL PROBLEMS  UNUSUAL RASH Items with * indicate a potential emergency and should be followed up as soon as possible.  Feel free to call the clinic should you have any questions or concerns. The clinic phone number is [phone].  Please show the [facility] at check-in to the Emergency Department and triage nurse.  Sodium Ferric Gluconate Complex injection What is this medicine? SODIUM FERRIC GLUCONATE COMPLEX (SOE dee um FER ik GLOO koe nate KOM pleks) is an iron replacement. It is used with epoetin therapy to treat low iron levels in patients who are receiving hemodialysis. This medicine may be used for other purposes; ask your health care provider or pharmacist if you have questions. COMMON BRAND NAME(S): Ferrlecit, Nulecit What should I tell my health care provider before I take this medicine? They need to know if you have any of the following conditions:  anemia that is not from iron deficiency  high levels of iron in the body  an unusual or allergic reaction to iron, benzyl alcohol, other medicines, foods, dyes, or preservatives  pregnant or are trying to become pregnant  breast-feeding How should I use this medicine? This medicine is for  infusion into a vein. It is given by a health care professional in a hospital or clinic setting. Talk to your pediatrician regarding the use of this medicine in children. While this drug may be prescribed for children as Jaiyla Granados as 59 years old for selected conditions, precautions do apply. Overdosage: If you think you have taken too much of this medicine contact a poison control center or emergency room at once. NOTE: This medicine is only for you. Do not share this medicine with others. What if I miss a dose? It is important not to miss your dose. Call your doctor or health care professional if you are unable to keep an appointment. What may interact with this medicine? Do not take this medicine with any of the following medications:  deferoxamine  dimercaprol  other iron products This medicine may also interact with the following medications:  chloramphenicol  deferasirox  medicine for blood pressure like enalapril This list may not describe all possible interactions. Give your health care provider a list of all the medicines, herbs, non-prescription drugs, or dietary supplements you use. Also tell them if you smoke, drink alcohol, or use illegal drugs. Some items may interact with your medicine. What should I watch for while using this medicine? Your condition will be monitored carefully while you are receiving this medicine. Visit your doctor for check-ups as directed. What side effects may I notice from receiving this medicine? Side effects that you should report to your doctor or health care professional as soon as possible:  allergic reactions like skin rash, itching or hives, swelling of the face, lips,  or tongue  breathing problems  changes in hearing  changes in vision  chills, flushing, or sweating  fast, irregular heartbeat  feeling faint or lightheaded, falls  fever, flu-like symptoms  high or low blood pressure  pain, tingling, numbness in the hands or  feet  severe pain in the chest, back, flanks, or groin  swelling of the ankles, feet, hands  trouble passing urine or change in the amount of urine  unusually weak or tired Side effects that usually do not require medical attention (report to your doctor or health care professional if they continue or are bothersome):  cramps  dark colored stools  diarrhea  headache  nausea, vomiting  stomach upset This list may not describe all possible side effects. Call your doctor for medical advice about side effects. You may report side effects to FDA at 1-800-FDA-1088. Where should I keep my medicine? This drug is given in a hospital or clinic and will not be stored at home. NOTE: This sheet is a summary. It may not cover all possible information. If you have questions about this medicine, talk to your doctor, pharmacist, or health care provider.  2020 Elsevier/Gold Standard (2007-12-02 15:58:57)

## 2019-05-14 ENCOUNTER — Telehealth: Payer: Self-pay

## 2019-05-14 DIAGNOSIS — M1990 Unspecified osteoarthritis, unspecified site: Secondary | ICD-10-CM | POA: Diagnosis not present

## 2019-05-14 NOTE — Telephone Encounter (Signed)
[name] called requesting note for her son who missed work yesterday b/c in provided transportation for patient yesterday

## 2019-05-17 DIAGNOSIS — M1990 Unspecified osteoarthritis, unspecified site: Secondary | ICD-10-CM | POA: Diagnosis not present

## 2019-05-18 DIAGNOSIS — M1990 Unspecified osteoarthritis, unspecified site: Secondary | ICD-10-CM | POA: Diagnosis not present

## 2019-05-19 DIAGNOSIS — M1990 Unspecified osteoarthritis, unspecified site: Secondary | ICD-10-CM | POA: Diagnosis not present

## 2019-05-20 ENCOUNTER — Telehealth: Payer: Self-pay | Admitting: *Deleted

## 2019-05-20 DIAGNOSIS — M1990 Unspecified osteoarthritis, unspecified site: Secondary | ICD-10-CM | POA: Diagnosis not present

## 2019-05-20 NOTE — Progress Notes (Signed)
[place]   Telephone:(336) [phone] Fax:(336) [phone]   Clinic Follow up Note   Patient Care Team: [name], MD as PCP - General (Internal Medicine)  Date of Service:  05/27/2019  CHIEF COMPLAINT: F/uof HHT andAnemia    PREVIOUS THERAPY:  -Multiple EGD, small bowel enteroscope, C-scope with APC -Oral and iv amicar were given during hospital stay, no effect -IR embolization ofof the left gastric and short gastric artery branch vessels without evidence of residual flow in the Dieulafoy lesionon 12/10/17 -Avastin on 8/2 and 8/30 at [facility]; starting 12/26/17 continue 5mg /kg q2 weeksuntil 02/20/18, restarted on 04/03/2018  CURRENT THERAPY: -Tamoxifen started in 10/2017 for HHTstopped 12/10/18 due toDVT -Blood transfusion for severe anemia secondary to GI bleedingas needed -ivferric gluconate every1-2 weeks as neededwith ferritin goal 100-200 -RestartedAvastin q2weeks on 04/03/18 -Amicar 1g bidstarting4/06/2018. Reduced to only as needed on 12/10/18 due toDVT  INTERVAL HISTORY:  Peggy House is here for a follow up. She presents to the clinic alone. She notes she is doing well. She denies any GI bleeding but still has epistaxis every other night. 4 night ago it lasted 45 minutes significantly. She notes occasional right abdominal pain when she moves around. She notes occasional nausea as well. She feels her pain is 5-9/10. She feels she can still eat. She notes she had a large abdominal tumor removed when she had hysterectomy and BSO in 2009 I reviewed her medication list with her. She is on Xanax twice a day. She has anxiety from her bleeding and family life. Her husband was diagnosed with prostate cancer and her son is a diabetic on dialysis.    REVIEW OF SYSTEMS:   Constitutional: Denies fevers, chills or abnormal weight loss Eyes: Denies blurriness of vision Ears, nose, mouth, throat, and face: Denies mucositis or sore throat (+) Intermittent  epistaxis.  Respiratory: Denies cough, dyspnea or wheezes Cardiovascular: Denies palpitation, chest discomfort or lower extremity swelling Gastrointestinal:  Denies heartburn or change in bowel habits (+) occasional nausea (+) Intermittent right abdominal pain  Skin: Denies abnormal skin rashes Lymphatics: Denies new lymphadenopathy or easy bruising Neurological:Denies numbness, tingling or new weaknesses Behavioral/Psych: Mood is stable, no new changes  All other systems were reviewed with the patient and are negative.  MEDICAL HISTORY:  Past Medical History:  Diagnosis Date  . Anxiety   . Arthritis    knnes,back  . GERD (gastroesophageal reflux disease)   . 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 History of swelling of feet   . Hyperlipidemia   . Hypertension   . Major depressive disorder, recurrent episode ([place]) 06/05/2015  . Obesity   . Snores   . 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 ABDOMINAL HYSTERECTOMY    . CARPAL TUNNEL RELEASE  05/13/2011   Procedure: CARPAL TUNNEL RELEASE;  Surgeon: Nita Sells, MD;  Location: [place];  Service: Orthopedics;  Laterality: Left;  . COLONOSCOPY WITH PROPOFOL N/A 04/28/2014   Procedure: COLONOSCOPY WITH PROPOFOL;  Surgeon: Cleotis Nipper, MD;  Location[facility] ENDOSCOPY;  Service: Endoscopy;  Laterality: N/A;  . DG TOES*L*  2/10   rt  . DILATION AND CURETTAGE OF UTERUS    . ENTEROSCOPY N/A 10/17/2017   Procedure: ENTEROSCOPY;  Surgeon: Otis Brace, MD;  Location: [place] ENDOSCOPY;  Service: Gastroenterology;  Laterality: N/A;  . ESOPHAGOGASTRODUODENOSCOPY N/A 04/10/2014   Procedure: ESOPHAGOGASTRODUODENOSCOPY (EGD);  Surgeon:  [name], MD;  Location: [facility] ENDOSCOPY;  Service: Endoscopy;  Laterality: N/A;  . ESOPHAGOGASTRODUODENOSCOPY N/A 05/10/2017   Procedure: ESOPHAGOGASTRODUODENOSCOPY (EGD);   Surgeon: Ronald Lobo, MD;  Location: [facility] ENDOSCOPY;  Service: Endoscopy;  Laterality: N/A;  . ESOPHAGOGASTRODUODENOSCOPY N/A 09/22/2017   Procedure: ESOPHAGOGASTRODUODENOSCOPY (EGD);  Surgeon: Clarene Essex, MD;  Location: [place];  Service: Endoscopy;  Laterality: N/A;  bedside  . ESOPHAGOGASTRODUODENOSCOPY (EGD) WITH PROPOFOL N/A 04/27/2014   Procedure: ESOPHAGOGASTRODUODENOSCOPY (EGD) WITH PROPOFOL;  Surgeon: Cleotis Nipper, MD;  Location: [place];  Service: Endoscopy;  Laterality: N/A;  possible apc  . ESOPHAGOGASTRODUODENOSCOPY (EGD) WITH PROPOFOL N/A 09/30/2017   Procedure: ESOPHAGOGASTRODUODENOSCOPY (EGD) WITH PROPOFOL;  Surgeon: [name], MD;  Location: [place];  Service: Gastroenterology;  Laterality: N/A;  . ESOPHAGOGASTRODUODENOSCOPY (EGD) WITH PROPOFOL N/A 10/01/2017   Procedure: ESOPHAGOGASTRODUODENOSCOPY (EGD) WITH PROPOFOL;  Surgeon: [name], MD;  Location: [place];  Service: Gastroenterology;  Laterality: N/A;  . ESOPHAGOGASTRODUODENOSCOPY (EGD) WITH PROPOFOL N/A 10/08/2017   Procedure: ESOPHAGOGASTRODUODENOSCOPY (EGD) WITH PROPOFOL;  Surgeon: Otis Brace, MD;  Location: [facility] ENDOSCOPY;  Service: Gastroenterology;  Laterality: N/A;  . ESOPHAGOGASTRODUODENOSCOPY (EGD) WITH PROPOFOL N/A 10/17/2017   Procedure: ESOPHAGOGASTRODUODENOSCOPY (EGD) WITH PROPOFOL;  Surgeon: Otis Brace, MD;  Location: [facility] ENDOSCOPY;  Service: Gastroenterology;  Laterality: N/A;  . ESOPHAGOGASTRODUODENOSCOPY (EGD) WITH PROPOFOL N/A 10/19/2017   Procedure: ESOPHAGOGASTRODUODENOSCOPY (EGD) WITH PROPOFOL;  Surgeon: Otis Brace, MD;  Location: [facility] ENDOSCOPY;  Service: Gastroenterology;  Laterality: N/A;  . ESOPHAGOGASTRODUODENOSCOPY (EGD) WITH PROPOFOL N/A 12/04/2018   Procedure: ESOPHAGOGASTRODUODENOSCOPY (EGD) WITH PROPOFOL;  Surgeon: Wilford Corner, MD;  Location: [facility] ENDOSCOPY;  Service: Endoscopy;  Laterality: N/A;  . GIVENS CAPSULE STUDY N/A 10/02/2017   Procedure: GIVENS  CAPSULE STUDY;  Surgeon: [name], MD;  Location: [place];  Service: Gastroenterology;  Laterality: N/A;  . GIVENS CAPSULE STUDY N/A 10/08/2017   Procedure: GIVENS CAPSULE STUDY;  Surgeon: Otis Brace, MD;  Location: [place];  Service: Gastroenterology;  Laterality: N/A;  endoscopic placement of capsule  . HEMOSTASIS CLIP PLACEMENT  12/04/2018   Procedure: HEMOSTASIS CLIP PLACEMENT;  Surgeon: Wilford Corner, MD;  Location: [facility] ENDOSCOPY;  Service: Endoscopy;;  . HOT HEMOSTASIS N/A 04/27/2014   Procedure: HOT HEMOSTASIS (ARGON PLASMA COAGULATION/BICAP);  Surgeon: Cleotis Nipper, MD;  Location: [facility] ENDOSCOPY;  Service: Endoscopy;  Laterality: N/A;  . HOT HEMOSTASIS N/A 09/30/2017   Procedure: HOT HEMOSTASIS (ARGON PLASMA COAGULATION/BICAP);  Surgeon: [name], MD;  Location: [place];  Service: Gastroenterology;  Laterality: N/A;  . HOT HEMOSTASIS N/A 10/01/2017   Procedure: HOT HEMOSTASIS (ARGON PLASMA COAGULATION/BICAP);  Surgeon: [name], MD;  Location: [place];  Service: Gastroenterology;  Laterality: N/A;  . HOT HEMOSTASIS N/A 10/17/2017   Procedure: HOT HEMOSTASIS (ARGON PLASMA COAGULATION/BICAP);  Surgeon: Otis Brace, MD;  Location: [facility] ENDOSCOPY;  Service: Gastroenterology;  Laterality: N/A;  . HOT HEMOSTASIS N/A 10/19/2017   Procedure: HOT HEMOSTASIS (ARGON PLASMA COAGULATION/BICAP);  Surgeon: Otis Brace, MD;  Location: [facility] ENDOSCOPY;  Service: Gastroenterology;  Laterality: N/A;  . IR IMAGING GUIDED PORT INSERTION  07/08/2018  . L shoulder Surgery  2011  . SUBMUCOSAL INJECTION  09/22/2017   Procedure: SUBMUCOSAL INJECTION;  Surgeon: Clarene Essex, MD;  Location: [facility] ENDOSCOPY;  Service: Endoscopy;;  . SUBMUCOSAL INJECTION  12/04/2018   Procedure: SUBMUCOSAL INJECTION;  Surgeon: Wilford Corner, MD;  Location: [facility] ENDOSCOPY;  Service: Endoscopy;;    I have reviewed the social history and family history with the patient and they are unchanged from  previous note.  ALLERGIES:  is allergic to feraheme [ferumoxytol]; nsaids; tomato; and wasp venom.  MEDICATIONS:  Current Outpatient Medications  Medication Sig Dispense Refill  . albuterol (PROVENTIL HFA;VENTOLIN HFA) 108 (90 Base) MCG/ACT inhaler Inhale 1-2 puffs into the lungs every 6 (six) hours as needed for wheezing or shortness of breath. 1 Inhaler 3  . ALPRAZolam (XANAX) 0.5 MG tablet TAKE 1 TABLET BY MOUTH 2 TIMES A DAY AS NEEDED FOR ANXIETY 60 tablet 0  . Aminocaproic Acid 1000 MG TABS TAKE 1 TABLET(1000 MG) BY MOUTH TWICE DAILY 60 tablet 1  . amLODipine (NORVASC) 10 MG tablet TAKE 1 TABLET(10 MG) BY MOUTH DAILY 90 tablet 0  . atorvastatin (LIPITOR) 80 MG tablet TAKE 1 TABLET(80 MG) BY MOUTH DAILY 30 tablet 3  . cetirizine (ZYRTEC ALLERGY) 10 MG tablet Take 1 tablet (10 mg total) by mouth daily. 30 tablet 2  . diphenhydramine-acetaminophen (TYLENOL PM) 25-500 MG TABS tablet Take 1 tablet by mouth at bedtime as needed (sleep).    . diphenoxylate-atropine (LOMOTIL) 2.5-0.025 MG tablet 1 to 2 PO QID prn diarrhea 30 tablet 1  . guaifenesin (ROBITUSSIN) 100 MG/5ML syrup Take 200 mg by mouth 3 (three) times daily as needed for cough or congestion.    . hydrochlorothiazide (HYDRODIURIL) 12.5 MG tablet TAKE 1 TABLET(12.5 MG) BY MOUTH DAILY 90 tablet 1  . lidocaine-prilocaine (EMLA) cream Apply 1 application topically as needed. 30 g 0  . metFORMIN (GLUCOPHAGE) 500 MG tablet TAKE 1 TABLET(500 MG) BY MOUTH TWICE DAILY WITH A MEAL 180 tablet 1  . pantoprazole (PROTONIX) 40 MG tablet Take 1 tablet (40 mg total) by mouth 2 (two) times daily. 60 tablet 5  . potassium chloride (KLOR-CON) 20 MEQ packet Take 20 mEq by mouth daily. 15 packet 0  . prochlorperazine (COMPAZINE) 10 MG tablet Take 1 tablet (10 mg total) by mouth every 6 (six) hours as needed for nausea or vomiting. 30 tablet 1  . sertraline (ZOLOFT) 100 MG tablet TAKE 1 TABLET(100 MG) BY MOUTH DAILY 90 tablet 1  . traZODone (DESYREL) 50 MG  tablet TAKE 1 TABLET(50 MG) BY MOUTH AT BEDTIME 30 tablet 2   No current facility-administered medications for this visit.    PHYSICAL EXAMINATION: ECOG PERFORMANCE STATUS: 2 - Symptomatic, <50% confined to bed  Vitals:   05/27/19 1349  BP: (!) 157/79  Pulse: 74  Resp: 17  Temp: 98.7 F (37.1 C)  SpO2: 100%   Filed Weights   05/27/19 1349  Weight: 265 lb 3.2 oz (120.3 kg)    GENERAL:alert, no distress and comfortable SKIN: skin color, texture, turgor are normal, no rashes or significant lesions EYES: normal, Conjunctiva are pink and non-injected, sclera clear  NECK: supple, thyroid normal size, non-tender, without nodularity LYMPH:  no palpable lymphadenopathy in the cervical, axillary  LUNGS: clear to auscultation and percussion with normal breathing effort HEART: regular rate & rhythm and no murmurs and no lower extremity edema ABDOMEN:abdomen soft, non-tender and normal bowel sounds (+) RLQ tenderness, no rebound tenderness Musculoskeletal:no cyanosis of digits and no clubbing  NEURO: alert & oriented x 3 with fluent speech, no focal motor/sensory deficits  LABORATORY DATA:  I have reviewed the data as listed CBC Latest Ref Rng & Units 05/27/2019 05/13/2019 04/22/2019  WBC 4.0 - 10.5 K/uL 11.8(H) 11.3(H) 12.8(H)  Hemoglobin 12.0 - 15.0 g/dL 9.1(L) 8.9(L) 9.1(L)  Hematocrit 36.0 - 46.0 % 31.3(L) 30.2(L) 31.1(L)  Platelets 150 - 400 K/uL 254 233 291     CMP Latest Ref  Rng & Units 05/27/2019 04/15/2019 03/08/2019  Glucose 70 - 99 mg/dL 109(H) 128(H) 121(H)  BUN 6 - 20 mg/dL 9 10 10   Creatinine 0.44 - 1.00 mg/dL 0.75 0.99 0.87  Sodium 135 - 145 mmol/L 142 140 142  Potassium 3.5 - 5.1 mmol/L 3.5 3.2(L) 3.7  Chloride 98 - 111 mmol/L 104 104 108  CO2 22 - 32 mmol/L 29 24 24   Calcium 8.9 - 10.3 mg/dL 9.4 9.1 9.0  Total Protein 6.5 - 8.1 g/dL 8.0 7.9 7.1  Total Bilirubin 0.3 - 1.2 mg/dL 0.3 <0.2(L) 0.3  Alkaline Phos 38 - 126 U/L 79 83 67  AST 15 - 41 U/L 21 13(L) 15  ALT  0 - 44 U/L 18 8 10       RADIOGRAPHIC STUDIES: I have personally reviewed the radiological images as listed and agreed with the findings in the report. No results found.   ASSESSMENT & PLAN:  SHIRRELL DELMONICO is a 59 y.o. female with    1. 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 Extensive workup found the pt to have HHT based on her personal and familyhistoryand recurrentAVM GI bleedings. -Sheis s/pIR embolization of the left Gastric and short gastric artery branchvesselswithout evidence of residual flow in the Dieulafoy lesionon 12/10/2017. -ShereceivedAv[facility] and then every 2 weeks9/13/19-11/8/19 -Shewas treated with Tamoxifen and Amicar for her HHT, but due to recent DVT, tamoxifen was stopped and she only takes Amicar as needed for severe epistaxis. -Her primary treatment will now be Avastin and IV Ironand blood transfusions as needed.We repeatedly discussed consistency in treatments to manage her blood counts, she voices good understanding. -She is overall stable and continues to have intermittent epistaxis every other night. She does note intermittent right abdominal pain with activities and occasional nausea.    -Labs reviewed, Hg 9.1, Ferritin still pending. Her WBC 11.8 and ANC 9.2. Overall adequate to proceed with IVferric gluconate. Will hold Avastin today given unknown cause of RLQ pain.  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  -EGDfrom 8/21/20showeda dieulafoy lesion which was clipped and injected, large amount blood found -We will follow her with monthly labs and IV ironand blood transfusionsas needed. Last Blood transfusion was 04/20/19.   3. Reactive Leukocytosis and reactive thrombocytosis  -She has mild leukocytosis with dominant neutrophils, likely reactive -Thrombocytosis recently resolved. Leukocytosis intermittent. Continue monitoring. WBC and ANC improved, plt are normal.   4. 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 -I recommend she continue to follow up with her GI, Dr. Cristina Gong -She had severe GI bleeding which required hospitalization in 11/2018 with several blood transfusions and EGD to cauterize her stomach bleeding. -I started heron Amicar 07/17/18. Given recent DVT she will only take Amicar for severe epistaxis -Her GI bleeding has resolved currently.But has been havingalmost nightly epistaxis manageable. -To help her stop epistaxis I suggest she use ice bag with pressure on her nose.   5.History of right Arm  DVT -She had right arm DVT that extended to her right clavicleon 12/07/2018 when she was hospitalized for GI bleeding.It is possibly related to Amicar, tamoxifen and port. -She is not a candidate for anticoagulation due to severe GI bleeding -We stopped Tamoxifen, she will only use Amicar for severe epistaxis. Will maintain Port flushes.  6. Smoking cessation -She was smoking 10 cigarettes a day on average before recent hospitalization -I strongly encouraged her to stop smoking completely, she will try.  7. Hypokalemia -Onpotassium chloride 20 mEq daily, continue. Willmonitor  8. Hyperglycemia, new onset diabetes -Ipreviouslyadvised her to stop eating sweats and reduce her carbohydrate intake.  -Istarted her  onMetformin BIDon11/5/20 to manage this.Continue to f/u with PCP to monitor herA1c and hyperglycemia  9. HTN, uncontrolled -Her BP has been intermittently elevated in clinic lately.  -I discussed Avastin can increase her BP. Will continue to monitor.  -I strongly encouraged her to keep up with her HTN medications daily to manage this. She understands.  10. Anxiety  -She notes having anxiety about her bleeding her family's health.  -She has currently on Xanax BID. I discussed regular use can cause dependence. I suggest she try to reduce to no more than once a day if needed. She will continue to get refills managed by her PCP.   11. RLQ pain, Nausea  -Has been present with activity for the past few days intermittently (pain level 5-9/10). She also has occasional nausea and chills at home.  -She does have tenderness in this area on exam today (05/27/19). She is afebrile. Her WBC and ANC are mildly elevated which is chronic  -I will order CT AP today to further evaluate especially to rule out appendicitis, she is agreeable.    PLAN: -Stat CT AP W Contrast today or tomorrow  -Labs reviewed and adequate to proceed with Ferric Gluconate today, hold Bevacizumab today  -Lab, flush, ferric gluconate every 2 weeks (if Hg<9.0, will add lab next week and hold iron if ferritin >200 on last lab) -will resume lab and Beva every 2 weeks if CT negative  -f/u in 2 months    No problem-specific Assessment & Plan notes found for this encounter.   Orders Placed This Encounter  Procedures  . CT Abdomen Pelvis W Contrast    RLL abdominal pain for 4-5 days with chills, no fever, mild chronic leukocytosis is stable, rule out acute appendicitis    Standing Status:   Future    Standing Expiration Date:   05/26/2020    Order Specific Question:   If indicated for the ordered procedure, I authorize the administration of contrast media per Radiology protocol    Answer:   Yes    Order Specific Question:    Is patient pregnant?    Answer:   No    Order Specific Question:   Preferred imaging location?    Answer:   [facility]    Order Specific Question:   Is Oral Contrast requested for this exam?    Answer:   Yes, Per Radiology protocol    Order Specific Question:   Call Results- Best Contact Number?    Answer:   [phone] hold Patient    Order Specific Question:   Radiology Contrast Protocol - do NOT remove file path    Answer:   [email or web address]   All questions were answered. The patient knows to call the clinic with any problems, questions or concerns. No barriers to learning was detected. The total time spent in  the appointment was 40 minutes.     Truitt Merle, MD 05/27/2019   I, Joslyn Devon, am acting as scribe for Truitt Merle, MD.   I have reviewed the above documentation for accuracy and completeness, and I agree with the above.

## 2019-05-20 NOTE — Telephone Encounter (Signed)
Called patient and left a message to return my call at [phone]. Lattie Haw

## 2019-05-21 DIAGNOSIS — M1990 Unspecified osteoarthritis, unspecified site: Secondary | ICD-10-CM | POA: Diagnosis not present

## 2019-05-24 DIAGNOSIS — M1990 Unspecified osteoarthritis, unspecified site: Secondary | ICD-10-CM | POA: Diagnosis not present

## 2019-05-25 DIAGNOSIS — M1990 Unspecified osteoarthritis, unspecified site: Secondary | ICD-10-CM | POA: Diagnosis not present

## 2019-05-26 ENCOUNTER — Other Ambulatory Visit: Payer: Self-pay | Admitting: Internal Medicine

## 2019-05-26 DIAGNOSIS — M1990 Unspecified osteoarthritis, unspecified site: Secondary | ICD-10-CM | POA: Diagnosis not present

## 2019-05-26 DIAGNOSIS — F331 Major depressive disorder, recurrent, moderate: Secondary | ICD-10-CM

## 2019-05-26 DIAGNOSIS — I10 Essential (primary) hypertension: Secondary | ICD-10-CM

## 2019-05-27 ENCOUNTER — Inpatient Hospital Stay: Payer: Medicaid Other | Attending: Hematology

## 2019-05-27 ENCOUNTER — Inpatient Hospital Stay: Payer: Medicaid Other

## 2019-05-27 ENCOUNTER — Inpatient Hospital Stay (HOSPITAL_BASED_OUTPATIENT_CLINIC_OR_DEPARTMENT_OTHER): Payer: Medicaid Other | Admitting: Hematology

## 2019-05-27 ENCOUNTER — Other Ambulatory Visit: Payer: Self-pay

## 2019-05-27 ENCOUNTER — Encounter: Payer: Self-pay | Admitting: Hematology

## 2019-05-27 VITALS — BP 155/77 | HR 74 | Temp 98.5°F | Resp 18

## 2019-05-27 VITALS — BP 157/79 | HR 74 | Temp 98.7°F | Resp 17 | Ht 65.5 in | Wt 265.2 lb

## 2019-05-27 DIAGNOSIS — D500 Iron deficiency anemia secondary to blood loss (chronic): Secondary | ICD-10-CM

## 2019-05-27 DIAGNOSIS — I780 Hereditary hemorrhagic telangiectasia: Secondary | ICD-10-CM | POA: Insufficient documentation

## 2019-05-27 DIAGNOSIS — Z5112 Encounter for antineoplastic immunotherapy: Secondary | ICD-10-CM | POA: Insufficient documentation

## 2019-05-27 DIAGNOSIS — Z79899 Other long term (current) drug therapy: Secondary | ICD-10-CM | POA: Diagnosis not present

## 2019-05-27 DIAGNOSIS — M1990 Unspecified osteoarthritis, unspecified site: Secondary | ICD-10-CM | POA: Diagnosis not present

## 2019-05-27 DIAGNOSIS — Z95828 Presence of other vascular implants and grafts: Secondary | ICD-10-CM

## 2019-05-27 DIAGNOSIS — R1031 Right lower quadrant pain: Secondary | ICD-10-CM

## 2019-05-27 LAB — CBC WITH DIFFERENTIAL (CANCER CENTER ONLY)
Abs Immature Granulocytes: 0.04 10*3/uL (ref 0.00–0.07)
Basophils Absolute: 0 10*3/uL (ref 0.0–0.1)
Basophils Relative: 0 %
Eosinophils Absolute: 0.1 10*3/uL (ref 0.0–0.5)
Eosinophils Relative: 1 %
HCT: 31.3 % — ABNORMAL LOW (ref 36.0–46.0)
Hemoglobin: 9.1 g/dL — ABNORMAL LOW (ref 12.0–15.0)
Immature Granulocytes: 0 %
Lymphocytes Relative: 17 %
Lymphs Abs: 2 10*3/uL (ref 0.7–4.0)
MCH: 23.9 pg — ABNORMAL LOW (ref 26.0–34.0)
MCHC: 29.1 g/dL — ABNORMAL LOW (ref 30.0–36.0)
MCV: 82.4 fL (ref 80.0–100.0)
Monocytes Absolute: 0.6 10*3/uL (ref 0.1–1.0)
Monocytes Relative: 5 %
Neutro Abs: 9.2 10*3/uL — ABNORMAL HIGH (ref 1.7–7.7)
Neutrophils Relative %: 77 %
Platelet Count: 254 10*3/uL (ref 150–400)
RBC: 3.8 MIL/uL — ABNORMAL LOW (ref 3.87–5.11)
RDW: 23.2 % — ABNORMAL HIGH (ref 11.5–15.5)
WBC Count: 11.8 10*3/uL — ABNORMAL HIGH (ref 4.0–10.5)
nRBC: 0.2 % (ref 0.0–0.2)

## 2019-05-27 LAB — CMP (CANCER CENTER ONLY)
ALT: 18 U/L (ref 0–44)
AST: 21 U/L (ref 15–41)
Albumin: 3.8 g/dL (ref 3.5–5.0)
Alkaline Phosphatase: 79 U/L (ref 38–126)
Anion gap: 9 (ref 5–15)
BUN: 9 mg/dL (ref 6–20)
CO2: 29 mmol/L (ref 22–32)
Calcium: 9.4 mg/dL (ref 8.9–10.3)
Chloride: 104 mmol/L (ref 98–111)
Creatinine: 0.75 mg/dL (ref 0.44–1.00)
GFR, Est AFR Am: >60 mL/min (ref >60)
GFR, Estimated: >60 mL/min (ref >60)
Glucose, Bld: 109 mg/dL — ABNORMAL HIGH (ref 70–99)
Potassium: 3.5 mmol/L (ref 3.5–5.1)
Sodium: 142 mmol/L (ref 135–145)
Total Bilirubin: 0.3 mg/dL (ref 0.3–1.2)
Total Protein: 8 g/dL (ref 6.5–8.1)

## 2019-05-27 MED ORDER — HEPARIN SOD (PORK) LOCK FLUSH 100 UNIT/ML IV SOLN
500.0000 [IU] | Freq: Once | INTRAVENOUS | Status: AC
  Administered 2019-05-27: 16:00:00 500 [IU]
  Filled 2019-05-27: qty 5

## 2019-05-27 MED ORDER — SODIUM CHLORIDE 0.9% FLUSH
10.0000 mL | Freq: Once | INTRAVENOUS | Status: AC
  Administered 2019-05-27: 10 mL
  Filled 2019-05-27: qty 10

## 2019-05-27 MED ORDER — SODIUM CHLORIDE 0.9 % IV SOLN
INTRAVENOUS | Status: DC
  Filled 2019-05-27: qty 250

## 2019-05-27 MED ORDER — SODIUM CHLORIDE 0.9 % IV SOLN
125.0000 mg | Freq: Once | INTRAVENOUS | Status: AC
  Administered 2019-05-27: 125 mg via INTRAVENOUS
  Filled 2019-05-27: qty 10

## 2019-05-27 NOTE — Patient Instructions (Signed)
Sodium Ferric Gluconate Complex injection What is this medicine? SODIUM FERRIC GLUCONATE COMPLEX (SOE dee um FER ik GLOO koe nate KOM pleks) is an iron replacement. It is used with epoetin therapy to treat low iron levels in patients who are receiving hemodialysis. This medicine may be used for other purposes; ask your health care provider or pharmacist if you have questions. COMMON BRAND NAME(S): Ferrlecit, Nulecit What should I tell my health care provider before I take this medicine? They need to know if you have any of the following conditions:  anemia that is not from iron deficiency  high levels of iron in the body  an unusual or allergic reaction to iron, benzyl alcohol, other medicines, foods, dyes, or preservatives  pregnant or are trying to become pregnant  breast-feeding How should I use this medicine? This medicine is for infusion into a vein. It is given by a health care professional in a hospital or clinic setting. Talk to your pediatrician regarding the use of this medicine in children. While this drug may be prescribed for children as young as 6 years old for selected conditions, precautions do apply. Overdosage: If you think you have taken too much of this medicine contact a poison control center or emergency room at once. NOTE: This medicine is only for you. Do not share this medicine with others. What if I miss a dose? It is important not to miss your dose. Call your doctor or health care professional if you are unable to keep an appointment. What may interact with this medicine? Do not take this medicine with any of the following medications:  deferoxamine  dimercaprol  other iron products This medicine may also interact with the following medications:  chloramphenicol  deferasirox  medicine for blood pressure like enalapril This list may not describe all possible interactions. Give your health care provider a list of all the medicines, herbs,  non-prescription drugs, or dietary supplements you use. Also tell them if you smoke, drink alcohol, or use illegal drugs. Some items may interact with your medicine. What should I watch for while using this medicine? Your condition will be monitored carefully while you are receiving this medicine. Visit your doctor for check-ups as directed. What side effects may I notice from receiving this medicine? Side effects that you should report to your doctor or health care professional as soon as possible:  allergic reactions like skin rash, itching or hives, swelling of the face, lips, or tongue  breathing problems  changes in hearing  changes in vision  chills, flushing, or sweating  fast, irregular heartbeat  feeling faint or lightheaded, falls  fever, flu-like symptoms  high or low blood pressure  pain, tingling, numbness in the hands or feet  severe pain in the chest, back, flanks, or groin  swelling of the ankles, feet, hands  trouble passing urine or change in the amount of urine  unusually weak or tired Side effects that usually do not require medical attention (report to your doctor or health care professional if they continue or are bothersome):  cramps  dark colored stools  diarrhea  headache  nausea, vomiting  stomach upset This list may not describe all possible side effects. Call your doctor for medical advice about side effects. You may report side effects to FDA at 1-800-FDA-1088. Where should I keep my medicine? This drug is given in a hospital or clinic and will not be stored at home. NOTE: This sheet is a summary. It may not cover all   possible information. If you have questions about this medicine, talk to your doctor, pharmacist, or health care provider.  2020 Elsevier/Gold Standard (2007-12-02 15:58:57)  

## 2019-05-28 ENCOUNTER — Telehealth: Payer: Self-pay

## 2019-05-28 ENCOUNTER — Telehealth: Payer: Self-pay | Admitting: Hematology

## 2019-05-28 DIAGNOSIS — M1990 Unspecified osteoarthritis, unspecified site: Secondary | ICD-10-CM | POA: Diagnosis not present

## 2019-05-28 LAB — FERRITIN: Ferritin: 43 ng/mL (ref 11–307)

## 2019-05-28 NOTE — Telephone Encounter (Signed)
Appointments scheduled per 02/11, left a voicemail for patient.

## 2019-05-28 NOTE — Telephone Encounter (Signed)
[name] called stating she has not been schedule for her CT scan yet.  I informed her we are still waiting for insurance authorization.  She state that the pain is now resolved.  I told her I would call her on Monday to follow up.  I instructed her to go to the [facility] [place] ED if the pain should return.  She verbalized understanding

## 2019-05-28 NOTE — Telephone Encounter (Signed)
Scheduled appt per 2/11 los.  Spoke with pt and she is aware of her appt date and time.

## 2019-05-31 ENCOUNTER — Telehealth: Payer: Self-pay | Admitting: Hematology

## 2019-05-31 DIAGNOSIS — M1990 Unspecified osteoarthritis, unspecified site: Secondary | ICD-10-CM | POA: Diagnosis not present

## 2019-05-31 NOTE — Telephone Encounter (Signed)
I called pt for f/u today, her abdominal pain has resolved since last Friday, no other new complains. Will cancel her CT abd/pel. I will reschedule her missed Avastin last week to this week, and give her one more dose ferrous gluconate this week. I sent a high priority schedule message for this, and will postpone her future appointment for a week. Pt agrees with the plan.  Peggy House  05/31/2019

## 2019-06-01 ENCOUNTER — Telehealth: Payer: Self-pay | Admitting: Hematology

## 2019-06-01 DIAGNOSIS — M1990 Unspecified osteoarthritis, unspecified site: Secondary | ICD-10-CM | POA: Diagnosis not present

## 2019-06-01 NOTE — Telephone Encounter (Signed)
Scheduled apt per 2/15 sch message - pt aware of appt date and time

## 2019-06-02 ENCOUNTER — Telehealth: Payer: Self-pay | Admitting: Hematology

## 2019-06-02 DIAGNOSIS — M1990 Unspecified osteoarthritis, unspecified site: Secondary | ICD-10-CM | POA: Diagnosis not present

## 2019-06-02 NOTE — Telephone Encounter (Signed)
Returned patient's phone call regarding rescheduling 02/18 appointment, per charge nurse appointment has moved to 02/19. Patient is aware.

## 2019-06-03 ENCOUNTER — Inpatient Hospital Stay: Payer: Medicaid Other

## 2019-06-03 DIAGNOSIS — M1990 Unspecified osteoarthritis, unspecified site: Secondary | ICD-10-CM | POA: Diagnosis not present

## 2019-06-04 ENCOUNTER — Ambulatory Visit: Payer: Medicaid Other

## 2019-06-04 ENCOUNTER — Telehealth: Payer: Self-pay | Admitting: Hematology

## 2019-06-04 DIAGNOSIS — M1990 Unspecified osteoarthritis, unspecified site: Secondary | ICD-10-CM | POA: Diagnosis not present

## 2019-06-04 NOTE — Telephone Encounter (Signed)
Pt was scheduled for lab and Avastin and ferric gluconate infusion today but had no show. I called her, she forgot about her appointments and her son's car was in shop. She is able to come in tomorrow. I have spoke with Threasa Beards and scheduled her for 11:45am tomorrow, no lab need tomorrow. I called her again and she promised she will come in tomorrow.   Truitt Merle  06/04/2019

## 2019-06-05 ENCOUNTER — Other Ambulatory Visit: Payer: Self-pay

## 2019-06-05 ENCOUNTER — Inpatient Hospital Stay: Payer: Medicaid Other

## 2019-06-05 VITALS — BP 136/68 | HR 67 | Temp 98.0°F | Resp 17

## 2019-06-05 DIAGNOSIS — D500 Iron deficiency anemia secondary to blood loss (chronic): Secondary | ICD-10-CM

## 2019-06-05 DIAGNOSIS — Z95828 Presence of other vascular implants and grafts: Secondary | ICD-10-CM

## 2019-06-05 DIAGNOSIS — I780 Hereditary hemorrhagic telangiectasia: Secondary | ICD-10-CM

## 2019-06-05 DIAGNOSIS — Z5112 Encounter for antineoplastic immunotherapy: Secondary | ICD-10-CM | POA: Diagnosis not present

## 2019-06-05 MED ORDER — HEPARIN SOD (PORK) LOCK FLUSH 100 UNIT/ML IV SOLN
500.0000 [IU] | Freq: Once | INTRAVENOUS | Status: AC | PRN
  Administered 2019-06-05: 500 [IU]
  Filled 2019-06-05: qty 5

## 2019-06-05 MED ORDER — SODIUM CHLORIDE 0.9 % IV SOLN
5.0000 mg/kg | Freq: Once | INTRAVENOUS | Status: AC
  Administered 2019-06-05: 14:00:00 600 mg via INTRAVENOUS
  Filled 2019-06-05: qty 16

## 2019-06-05 MED ORDER — SODIUM CHLORIDE 0.9 % IV SOLN
125.0000 mg | Freq: Once | INTRAVENOUS | Status: AC
  Administered 2019-06-05: 125 mg via INTRAVENOUS
  Filled 2019-06-05: qty 10

## 2019-06-05 MED ORDER — SODIUM CHLORIDE 0.9% FLUSH
10.0000 mL | INTRAVENOUS | Status: DC | PRN
  Administered 2019-06-05: 10 mL
  Filled 2019-06-05: qty 10

## 2019-06-05 MED ORDER — SODIUM CHLORIDE 0.9 % IV SOLN
Freq: Once | INTRAVENOUS | Status: AC
  Filled 2019-06-05: qty 250

## 2019-06-05 NOTE — Patient Instructions (Signed)
Sodium Ferric Gluconate Complex injection What is this medicine? SODIUM FERRIC GLUCONATE COMPLEX (SOE dee um FER ik GLOO koe nate KOM pleks) is an iron replacement. It is used with epoetin therapy to treat low iron levels in patients who are receiving hemodialysis. This medicine may be used for other purposes; ask your health care provider or pharmacist if you have questions. COMMON BRAND NAME(S): Ferrlecit, Nulecit What should I tell my health care provider before I take this medicine? They need to know if you have any of the following conditions:  anemia that is not from iron deficiency  high levels of iron in the body  an unusual or allergic reaction to iron, benzyl alcohol, other medicines, foods, dyes, or preservatives  pregnant or are trying to become pregnant  breast-feeding How should I use this medicine? This medicine is for infusion into a vein. It is given by a health care professional in a hospital or clinic setting. Talk to your pediatrician regarding the use of this medicine in children. While this drug may be prescribed for children as young as 6 years old for selected conditions, precautions do apply. Overdosage: If you think you have taken too much of this medicine contact a poison control center or emergency room at once. NOTE: This medicine is only for you. Do not share this medicine with others. What if I miss a dose? It is important not to miss your dose. Call your doctor or health care professional if you are unable to keep an appointment. What may interact with this medicine? Do not take this medicine with any of the following medications:  deferoxamine  dimercaprol  other iron products This medicine may also interact with the following medications:  chloramphenicol  deferasirox  medicine for blood pressure like enalapril This list may not describe all possible interactions. Give your health care provider a list of all the medicines, herbs,  non-prescription drugs, or dietary supplements you use. Also tell them if you smoke, drink alcohol, or use illegal drugs. Some items may interact with your medicine. What should I watch for while using this medicine? Your condition will be monitored carefully while you are receiving this medicine. Visit your doctor for check-ups as directed. What side effects may I notice from receiving this medicine? Side effects that you should report to your doctor or health care professional as soon as possible:  allergic reactions like skin rash, itching or hives, swelling of the face, lips, or tongue  breathing problems  changes in hearing  changes in vision  chills, flushing, or sweating  fast, irregular heartbeat  feeling faint or lightheaded, falls  fever, flu-like symptoms  high or low blood pressure  pain, tingling, numbness in the hands or feet  severe pain in the chest, back, flanks, or groin  swelling of the ankles, feet, hands  trouble passing urine or change in the amount of urine  unusually weak or tired Side effects that usually do not require medical attention (report to your doctor or health care professional if they continue or are bothersome):  cramps  dark colored stools  diarrhea  headache  nausea, vomiting  stomach upset This list may not describe all possible side effects. Call your doctor for medical advice about side effects. You may report side effects to FDA at 1-800-FDA-1088. Where should I keep my medicine? This drug is given in a hospital or clinic and will not be stored at home. NOTE: This sheet is a summary. It may not cover all   possible information. If you have questions about this medicine, talk to your doctor, pharmacist, or health care provider.  2020 Elsevier/Gold Standard (2007-12-02 15:58:57)  [facility] Discharge Instructions for Patients Receiving Chemotherapy  Today you received the following chemotherapy agents:  Bevacizumab   To help prevent nausea and vomiting after your treatment, we encourage you to take your nausea medication as directed.    If you develop nausea and vomiting that is not controlled by your nausea medication, call the clinic.   BELOW ARE SYMPTOMS THAT SHOULD BE REPORTED IMMEDIATELY:  *FEVER GREATER THAN 100.5 F  *CHILLS WITH OR WITHOUT FEVER  NAUSEA AND VOMITING THAT IS NOT CONTROLLED WITH YOUR NAUSEA MEDICATION  *UNUSUAL SHORTNESS OF BREATH  *UNUSUAL BRUISING OR BLEEDING  TENDERNESS IN MOUTH AND THROAT WITH OR WITHOUT PRESENCE OF ULCERS  *URINARY PROBLEMS  *BOWEL PROBLEMS  UNUSUAL RASH Items with * indicate a potential emergency and should be followed up as soon as possible.  Feel free to call the clinic should you have any questions or concerns. The clinic phone number is [phone].  Please show the [place] at check-in to the Emergency Department and triage nurse.

## 2019-06-07 DIAGNOSIS — M1990 Unspecified osteoarthritis, unspecified site: Secondary | ICD-10-CM | POA: Diagnosis not present

## 2019-06-08 ENCOUNTER — Telehealth: Payer: Self-pay | Admitting: Hematology

## 2019-06-08 DIAGNOSIS — M1990 Unspecified osteoarthritis, unspecified site: Secondary | ICD-10-CM | POA: Diagnosis not present

## 2019-06-08 NOTE — Telephone Encounter (Signed)
Scheduled per 2/23 staff msg. Called and spoke with pt, confirmed 2/25 appt

## 2019-06-09 DIAGNOSIS — M1990 Unspecified osteoarthritis, unspecified site: Secondary | ICD-10-CM | POA: Diagnosis not present

## 2019-06-10 ENCOUNTER — Other Ambulatory Visit: Payer: Medicaid Other

## 2019-06-10 ENCOUNTER — Ambulatory Visit: Payer: Medicaid Other

## 2019-06-10 ENCOUNTER — Inpatient Hospital Stay: Payer: Medicaid Other

## 2019-06-10 ENCOUNTER — Other Ambulatory Visit: Payer: Self-pay

## 2019-06-10 DIAGNOSIS — Q828 Other specified congenital malformations of skin: Secondary | ICD-10-CM | POA: Diagnosis not present

## 2019-06-10 DIAGNOSIS — Z5112 Encounter for antineoplastic immunotherapy: Secondary | ICD-10-CM | POA: Diagnosis not present

## 2019-06-10 DIAGNOSIS — Z95828 Presence of other vascular implants and grafts: Secondary | ICD-10-CM

## 2019-06-10 DIAGNOSIS — D500 Iron deficiency anemia secondary to blood loss (chronic): Secondary | ICD-10-CM

## 2019-06-10 DIAGNOSIS — Z5181 Encounter for therapeutic drug level monitoring: Secondary | ICD-10-CM | POA: Diagnosis not present

## 2019-06-10 DIAGNOSIS — M1990 Unspecified osteoarthritis, unspecified site: Secondary | ICD-10-CM | POA: Diagnosis not present

## 2019-06-10 LAB — CBC WITH DIFFERENTIAL (CANCER CENTER ONLY)
Abs Immature Granulocytes: 0.05 10*3/uL (ref 0.00–0.07)
Basophils Absolute: 0 10*3/uL (ref 0.0–0.1)
Basophils Relative: 0 %
Eosinophils Absolute: 0.1 10*3/uL (ref 0.0–0.5)
Eosinophils Relative: 1 %
HCT: 31.5 % — ABNORMAL LOW (ref 36.0–46.0)
Hemoglobin: 9.2 g/dL — ABNORMAL LOW (ref 12.0–15.0)
Immature Granulocytes: 0 %
Lymphocytes Relative: 15 %
Lymphs Abs: 1.9 10*3/uL (ref 0.7–4.0)
MCH: 24.4 pg — ABNORMAL LOW (ref 26.0–34.0)
MCHC: 29.2 g/dL — ABNORMAL LOW (ref 30.0–36.0)
MCV: 83.6 fL (ref 80.0–100.0)
Monocytes Absolute: 0.6 10*3/uL (ref 0.1–1.0)
Monocytes Relative: 5 %
Neutro Abs: 9.9 10*3/uL — ABNORMAL HIGH (ref 1.7–7.7)
Neutrophils Relative %: 79 %
Platelet Count: 243 10*3/uL (ref 150–400)
RBC: 3.77 MIL/uL — ABNORMAL LOW (ref 3.87–5.11)
RDW: 23.8 % — ABNORMAL HIGH (ref 11.5–15.5)
WBC Count: 12.6 10*3/uL — ABNORMAL HIGH (ref 4.0–10.5)
nRBC: 0 % (ref 0.0–0.2)

## 2019-06-10 LAB — TOTAL PROTEIN, URINE DIPSTICK: Protein, ur: 30 mg/dL — AB

## 2019-06-10 MED ORDER — SODIUM CHLORIDE 0.9% FLUSH
10.0000 mL | Freq: Once | INTRAVENOUS | Status: AC
  Administered 2019-06-10: 10 mL
  Filled 2019-06-10: qty 10

## 2019-06-11 ENCOUNTER — Other Ambulatory Visit: Payer: Self-pay | Admitting: Hematology

## 2019-06-11 DIAGNOSIS — M1990 Unspecified osteoarthritis, unspecified site: Secondary | ICD-10-CM | POA: Diagnosis not present

## 2019-06-11 LAB — FERRITIN: Ferritin: 130 ng/mL (ref 11–307)

## 2019-06-14 DIAGNOSIS — M1990 Unspecified osteoarthritis, unspecified site: Secondary | ICD-10-CM | POA: Diagnosis not present

## 2019-06-15 DIAGNOSIS — M1990 Unspecified osteoarthritis, unspecified site: Secondary | ICD-10-CM | POA: Diagnosis not present

## 2019-06-16 DIAGNOSIS — M1990 Unspecified osteoarthritis, unspecified site: Secondary | ICD-10-CM | POA: Diagnosis not present

## 2019-06-17 ENCOUNTER — Inpatient Hospital Stay: Payer: Medicaid Other

## 2019-06-17 ENCOUNTER — Other Ambulatory Visit: Payer: Self-pay

## 2019-06-17 ENCOUNTER — Other Ambulatory Visit: Payer: Self-pay | Admitting: Nurse Practitioner

## 2019-06-17 ENCOUNTER — Inpatient Hospital Stay: Payer: Medicaid Other | Attending: Hematology

## 2019-06-17 ENCOUNTER — Other Ambulatory Visit: Payer: Self-pay | Admitting: Hematology

## 2019-06-17 VITALS — BP 162/80 | HR 62 | Temp 98.2°F | Resp 20 | Wt 259.0 lb

## 2019-06-17 DIAGNOSIS — Z5112 Encounter for antineoplastic immunotherapy: Secondary | ICD-10-CM | POA: Insufficient documentation

## 2019-06-17 DIAGNOSIS — E785 Hyperlipidemia, unspecified: Secondary | ICD-10-CM

## 2019-06-17 DIAGNOSIS — Z79899 Other long term (current) drug therapy: Secondary | ICD-10-CM | POA: Insufficient documentation

## 2019-06-17 DIAGNOSIS — I780 Hereditary hemorrhagic telangiectasia: Secondary | ICD-10-CM

## 2019-06-17 DIAGNOSIS — D500 Iron deficiency anemia secondary to blood loss (chronic): Secondary | ICD-10-CM | POA: Diagnosis present

## 2019-06-17 DIAGNOSIS — Z95828 Presence of other vascular implants and grafts: Secondary | ICD-10-CM

## 2019-06-17 DIAGNOSIS — M1990 Unspecified osteoarthritis, unspecified site: Secondary | ICD-10-CM | POA: Diagnosis not present

## 2019-06-17 LAB — CBC WITH DIFFERENTIAL (CANCER CENTER ONLY)
Abs Immature Granulocytes: 0.03 10*3/uL (ref 0.00–0.07)
Basophils Absolute: 0 10*3/uL (ref 0.0–0.1)
Basophils Relative: 0 %
Eosinophils Absolute: 0.1 10*3/uL (ref 0.0–0.5)
Eosinophils Relative: 1 %
HCT: 30.7 % — ABNORMAL LOW (ref 36.0–46.0)
Hemoglobin: 9 g/dL — ABNORMAL LOW (ref 12.0–15.0)
Immature Granulocytes: 0 %
Lymphocytes Relative: 15 %
Lymphs Abs: 1.6 10*3/uL (ref 0.7–4.0)
MCH: 24.5 pg — ABNORMAL LOW (ref 26.0–34.0)
MCHC: 29.3 g/dL — ABNORMAL LOW (ref 30.0–36.0)
MCV: 83.4 fL (ref 80.0–100.0)
Monocytes Absolute: 0.5 10*3/uL (ref 0.1–1.0)
Monocytes Relative: 5 %
Neutro Abs: 8.9 10*3/uL — ABNORMAL HIGH (ref 1.7–7.7)
Neutrophils Relative %: 79 %
Platelet Count: 256 10*3/uL (ref 150–400)
RBC: 3.68 MIL/uL — ABNORMAL LOW (ref 3.87–5.11)
RDW: 22.7 % — ABNORMAL HIGH (ref 11.5–15.5)
WBC Count: 11.1 10*3/uL — ABNORMAL HIGH (ref 4.0–10.5)
nRBC: 0 % (ref 0.0–0.2)

## 2019-06-17 LAB — CMP (CANCER CENTER ONLY)
ALT: 9 U/L (ref 0–44)
AST: 15 U/L (ref 15–41)
Albumin: 3.6 g/dL (ref 3.5–5.0)
Alkaline Phosphatase: 68 U/L (ref 38–126)
Anion gap: 10 (ref 5–15)
BUN: 10 mg/dL (ref 6–20)
CO2: 26 mmol/L (ref 22–32)
Calcium: 9.3 mg/dL (ref 8.9–10.3)
Chloride: 106 mmol/L (ref 98–111)
Creatinine: 0.84 mg/dL (ref 0.44–1.00)
GFR, Est AFR Am: >60 mL/min (ref >60)
GFR, Estimated: >60 mL/min (ref >60)
Glucose, Bld: 124 mg/dL — ABNORMAL HIGH (ref 70–99)
Potassium: 3.6 mmol/L (ref 3.5–5.1)
Sodium: 142 mmol/L (ref 135–145)
Total Bilirubin: 0.3 mg/dL (ref 0.3–1.2)
Total Protein: 7.7 g/dL (ref 6.5–8.1)

## 2019-06-17 LAB — IRON AND TIBC
Iron: 30 ug/dL — ABNORMAL LOW (ref 41–142)
Saturation Ratios: 11 % — ABNORMAL LOW (ref 21–57)
TIBC: 283 ug/dL (ref 236–444)
UIBC: 253 ug/dL (ref 120–384)

## 2019-06-17 LAB — FERRITIN: Ferritin: 68 ng/mL (ref 11–307)

## 2019-06-17 MED ORDER — SODIUM CHLORIDE 0.9% FLUSH
10.0000 mL | Freq: Once | INTRAVENOUS | Status: AC
  Administered 2019-06-17: 10 mL
  Filled 2019-06-17: qty 10

## 2019-06-17 MED ORDER — SODIUM CHLORIDE 0.9 % IV SOLN
Freq: Once | INTRAVENOUS | Status: AC
  Filled 2019-06-17: qty 250

## 2019-06-17 MED ORDER — SODIUM CHLORIDE 0.9% FLUSH
10.0000 mL | INTRAVENOUS | Status: DC | PRN
  Administered 2019-06-17: 10 mL
  Filled 2019-06-17: qty 10

## 2019-06-17 MED ORDER — SODIUM CHLORIDE 0.9 % IV SOLN
5.0000 mg/kg | Freq: Once | INTRAVENOUS | Status: AC
  Administered 2019-06-17: 15:00:00 600 mg via INTRAVENOUS
  Filled 2019-06-17: qty 8

## 2019-06-17 MED ORDER — HEPARIN SOD (PORK) LOCK FLUSH 100 UNIT/ML IV SOLN
500.0000 [IU] | Freq: Once | INTRAVENOUS | Status: AC | PRN
  Administered 2019-06-17: 500 [IU]
  Filled 2019-06-17: qty 5

## 2019-06-17 MED ORDER — SODIUM CHLORIDE 0.9 % IV SOLN
125.0000 mg | Freq: Once | INTRAVENOUS | Status: AC
  Administered 2019-06-17: 125 mg via INTRAVENOUS
  Filled 2019-06-17: qty 10

## 2019-06-17 NOTE — Patient Instructions (Signed)
[facility] Discharge Instructions for Patients Receiving Chemotherapy  Today you received the following Immunotherapy: Bevacizumab  To help prevent nausea and vomiting after your treatment, we encourage you to take your nausea medication as directed by your MD.   If you develop nausea and vomiting that is not controlled by your nausea medication, call the clinic.   BELOW ARE SYMPTOMS THAT SHOULD BE REPORTED IMMEDIATELY:  *FEVER GREATER THAN 100.5 F  *CHILLS WITH OR WITHOUT FEVER  NAUSEA AND VOMITING THAT IS NOT CONTROLLED WITH YOUR NAUSEA MEDICATION  *UNUSUAL SHORTNESS OF BREATH  *UNUSUAL BRUISING OR BLEEDING  TENDERNESS IN MOUTH AND THROAT WITH OR WITHOUT PRESENCE OF ULCERS  *URINARY PROBLEMS  *BOWEL PROBLEMS  UNUSUAL RASH Items with * indicate a potential emergency and should be followed up as soon as possible.  Feel free to call the clinic should you have any questions or concerns. The clinic phone number is [phone].  Please show the [place] at check-in to the Emergency Department and triage nurse.  Sodium Ferric Gluconate Complex injection What is this medicine? SODIUM FERRIC GLUCONATE COMPLEX (SOE dee um FER ik GLOO koe nate KOM pleks) is an iron replacement. It is used with epoetin therapy to treat low iron levels in patients who are receiving hemodialysis. This medicine may be used for other purposes; ask your health care provider or pharmacist if you have questions. COMMON BRAND NAME(S): Ferrlecit, Nulecit What should I tell my health care provider before I take this medicine? They need to know if you have any of the following conditions:  anemia that is not from iron deficiency  high levels of iron in the body  an unusual or allergic reaction to iron, benzyl alcohol, other medicines, foods, dyes, or preservatives  pregnant or are trying to become pregnant  breast-feeding How should I use this medicine? This medicine is for  infusion into a vein. It is given by a health care professional in a hospital or clinic setting. Talk to your pediatrician regarding the use of this medicine in children. While this drug may be prescribed for children as young as 38 years old for selected conditions, precautions do apply. Overdosage: If you think you have taken too much of this medicine contact a poison control center or emergency room at once. NOTE: This medicine is only for you. Do not share this medicine with others. What if I miss a dose? It is important not to miss your dose. Call your doctor or health care professional if you are unable to keep an appointment. What may interact with this medicine? Do not take this medicine with any of the following medications:  deferoxamine  dimercaprol  other iron products This medicine may also interact with the following medications:  chloramphenicol  deferasirox  medicine for blood pressure like enalapril This list may not describe all possible interactions. Give your health care provider a list of all the medicines, herbs, non-prescription drugs, or dietary supplements you use. Also tell them if you smoke, drink alcohol, or use illegal drugs. Some items may interact with your medicine. What should I watch for while using this medicine? Your condition will be monitored carefully while you are receiving this medicine. Visit your doctor for check-ups as directed. What side effects may I notice from receiving this medicine? Side effects that you should report to your doctor or health care professional as soon as possible:  allergic reactions like skin rash, itching or hives, swelling of the face, lips,  or tongue  breathing problems  changes in hearing  changes in vision  chills, flushing, or sweating  fast, irregular heartbeat  feeling faint or lightheaded, falls  fever, flu-like symptoms  high or low blood pressure  pain, tingling, numbness in the hands or  feet  severe pain in the chest, back, flanks, or groin  swelling of the ankles, feet, hands  trouble passing urine or change in the amount of urine  unusually weak or tired Side effects that usually do not require medical attention (report to your doctor or health care professional if they continue or are bothersome):  cramps  dark colored stools  diarrhea  headache  nausea, vomiting  stomach upset This list may not describe all possible side effects. Call your doctor for medical advice about side effects. You may report side effects to FDA at 1-800-FDA-1088. Where should I keep my medicine? This drug is given in a hospital or clinic and will not be stored at home. NOTE: This sheet is a summary. It may not cover all possible information. If you have questions about this medicine, talk to your doctor, pharmacist, or health care provider.  2020 Elsevier/Gold Standard (2007-12-02 15:58:57)  Coronavirus (COVID-19) Are you at risk?  Are you at risk for the Coronavirus (COVID-19)?  To be considered HIGH RISK for Coronavirus (COVID-19), you have to meet the following criteria:  . Traveled to [place], [place], [place], [place] or [place]; or in the [place] to [place], [place], [place], or [place]; and have fever, cough, and shortness of breath within the last 2 weeks of travel OR . Been in close contact with a person diagnosed with COVID-19 within the last 2 weeks and have fever, cough, and shortness of breath . IF YOU DO NOT MEET THESE CRITERIA, YOU ARE CONSIDERED LOW RISK FOR COVID-19.  What to do if you are HIGH RISK for COVID-19?  [name] If you are having a medical emergency, call 911. . Seek medical care right away. Before you go to a doctor's office, urgent care or emergency department, call ahead and tell them about your recent travel, contact with someone diagnosed with COVID-19, and your symptoms. You should receive instructions from your physician's office regarding  next steps of care.  . When you arrive at healthcare provider, tell the healthcare staff immediately you have returned from visiting [place], [place], [place], [place] or [place]; or traveled in the [place] to [place], [place], [place], or [place]; in the last two weeks or you have been in close contact with a person diagnosed with COVID-19 in the last 2 weeks.   . Tell the health care staff about your symptoms: fever, cough and shortness of breath. . After you have been seen by a medical provider, you will be either: o Tested for (COVID-19) and discharged home on quarantine except to seek medical care if symptoms worsen, and asked to  - Stay home and avoid contact with others until you get your results (4-5 days)  - Avoid travel on public transportation if possible (such as bus, train, or airplane) or o Sent to the Emergency Department by EMS for evaluation, COVID-19 testing, and possible admission depending on your condition and test results.  What to do if you are LOW RISK for COVID-19?  Reduce your risk of any infection by using the same precautions used for avoiding the common cold or flu:  [name] Wash your hands often with soap and warm water for at  least 20 seconds.  If soap and water are not readily available, use an alcohol-based hand sanitizer with at least 60% alcohol.  . If coughing or sneezing, cover your mouth and nose by coughing or sneezing into the elbow areas of your shirt or coat, into a tissue or into your sleeve (not your hands). . Avoid shaking hands with others and consider head nods or verbal greetings only. . Avoid touching your eyes, nose, or mouth with unwashed hands.  . Avoid close contact with people who are sick. . Avoid places or events with large numbers of people in one location, like concerts or sporting events. . Carefully consider travel plans you have or are making. . If you are planning any travel outside or inside the [place], visit the CDC's Travelers'  Health webpage for the latest health notices. . If you have some symptoms but not all symptoms, continue to monitor at home and seek medical attention if your symptoms worsen. . If you are having a medical emergency, call 911.   [place] / e-Visit: [email or web address]         [facility]: [facility]: [id number]                   [facility]: [phone]

## 2019-06-18 DIAGNOSIS — M1990 Unspecified osteoarthritis, unspecified site: Secondary | ICD-10-CM | POA: Diagnosis not present

## 2019-06-18 NOTE — Telephone Encounter (Signed)
Refill request

## 2019-06-21 DIAGNOSIS — M1990 Unspecified osteoarthritis, unspecified site: Secondary | ICD-10-CM | POA: Diagnosis not present

## 2019-06-22 DIAGNOSIS — M1990 Unspecified osteoarthritis, unspecified site: Secondary | ICD-10-CM | POA: Diagnosis not present

## 2019-06-23 DIAGNOSIS — M1990 Unspecified osteoarthritis, unspecified site: Secondary | ICD-10-CM | POA: Diagnosis not present

## 2019-06-24 ENCOUNTER — Other Ambulatory Visit: Payer: Medicaid Other

## 2019-06-24 ENCOUNTER — Ambulatory Visit: Payer: Medicaid Other

## 2019-06-24 DIAGNOSIS — M1990 Unspecified osteoarthritis, unspecified site: Secondary | ICD-10-CM | POA: Diagnosis not present

## 2019-06-25 DIAGNOSIS — M1990 Unspecified osteoarthritis, unspecified site: Secondary | ICD-10-CM | POA: Diagnosis not present

## 2019-06-28 DIAGNOSIS — M1990 Unspecified osteoarthritis, unspecified site: Secondary | ICD-10-CM | POA: Diagnosis not present

## 2019-06-29 DIAGNOSIS — M1990 Unspecified osteoarthritis, unspecified site: Secondary | ICD-10-CM | POA: Diagnosis not present

## 2019-06-30 DIAGNOSIS — M1990 Unspecified osteoarthritis, unspecified site: Secondary | ICD-10-CM | POA: Diagnosis not present

## 2019-07-01 ENCOUNTER — Inpatient Hospital Stay: Payer: Medicaid Other

## 2019-07-01 ENCOUNTER — Ambulatory Visit (HOSPITAL_BASED_OUTPATIENT_CLINIC_OR_DEPARTMENT_OTHER): Payer: Medicaid Other | Admitting: Medical

## 2019-07-01 ENCOUNTER — Inpatient Hospital Stay: Payer: Medicaid Other | Admitting: Medical

## 2019-07-01 ENCOUNTER — Other Ambulatory Visit: Payer: Self-pay

## 2019-07-01 VITALS — BP 160/85 | HR 72 | Temp 98.2°F | Resp 16

## 2019-07-01 DIAGNOSIS — D500 Iron deficiency anemia secondary to blood loss (chronic): Secondary | ICD-10-CM

## 2019-07-01 DIAGNOSIS — M1990 Unspecified osteoarthritis, unspecified site: Secondary | ICD-10-CM | POA: Diagnosis not present

## 2019-07-01 DIAGNOSIS — Z5112 Encounter for antineoplastic immunotherapy: Secondary | ICD-10-CM | POA: Diagnosis not present

## 2019-07-01 DIAGNOSIS — I780 Hereditary hemorrhagic telangiectasia: Secondary | ICD-10-CM

## 2019-07-01 DIAGNOSIS — Z95828 Presence of other vascular implants and grafts: Secondary | ICD-10-CM

## 2019-07-01 DIAGNOSIS — D649 Anemia, unspecified: Secondary | ICD-10-CM

## 2019-07-01 LAB — CBC WITH DIFFERENTIAL (CANCER CENTER ONLY)
Abs Immature Granulocytes: 0.06 10*3/uL (ref 0.00–0.07)
Basophils Absolute: 0 10*3/uL (ref 0.0–0.1)
Basophils Relative: 0 %
Eosinophils Absolute: 0.1 10*3/uL (ref 0.0–0.5)
Eosinophils Relative: 1 %
HCT: 30.7 % — ABNORMAL LOW (ref 36.0–46.0)
Hemoglobin: 9.2 g/dL — ABNORMAL LOW (ref 12.0–15.0)
Immature Granulocytes: 1 %
Lymphocytes Relative: 12 %
Lymphs Abs: 1.6 10*3/uL (ref 0.7–4.0)
MCH: 24.7 pg — ABNORMAL LOW (ref 26.0–34.0)
MCHC: 30 g/dL (ref 30.0–36.0)
MCV: 82.5 fL (ref 80.0–100.0)
Monocytes Absolute: 0.7 10*3/uL (ref 0.1–1.0)
Monocytes Relative: 5 %
Neutro Abs: 10.8 10*3/uL — ABNORMAL HIGH (ref 1.7–7.7)
Neutrophils Relative %: 81 %
Platelet Count: 266 10*3/uL (ref 150–400)
RBC: 3.72 MIL/uL — ABNORMAL LOW (ref 3.87–5.11)
RDW: 22.4 % — ABNORMAL HIGH (ref 11.5–15.5)
WBC Count: 13.3 10*3/uL — ABNORMAL HIGH (ref 4.0–10.5)
nRBC: 0 % (ref 0.0–0.2)

## 2019-07-01 LAB — CMP (CANCER CENTER ONLY)
ALT: 8 U/L (ref 0–44)
AST: 15 U/L (ref 15–41)
Albumin: 3.7 g/dL (ref 3.5–5.0)
Alkaline Phosphatase: 76 U/L (ref 38–126)
Anion gap: 11 (ref 5–15)
BUN: 11 mg/dL (ref 6–20)
CO2: 25 mmol/L (ref 22–32)
Calcium: 9.3 mg/dL (ref 8.9–10.3)
Chloride: 105 mmol/L (ref 98–111)
Creatinine: 0.77 mg/dL (ref 0.44–1.00)
GFR, Est AFR Am: >60 mL/min (ref >60)
GFR, Estimated: >60 mL/min (ref >60)
Glucose, Bld: 103 mg/dL — ABNORMAL HIGH (ref 70–99)
Potassium: 3.2 mmol/L — ABNORMAL LOW (ref 3.5–5.1)
Sodium: 141 mmol/L (ref 135–145)
Total Bilirubin: 0.3 mg/dL (ref 0.3–1.2)
Total Protein: 7.9 g/dL (ref 6.5–8.1)

## 2019-07-01 LAB — FERRITIN: Ferritin: 46 ng/mL (ref 11–307)

## 2019-07-01 MED ORDER — SODIUM CHLORIDE 0.9 % IV SOLN
5.0000 mg/kg | Freq: Once | INTRAVENOUS | Status: AC
  Administered 2019-07-01: 15:00:00 600 mg via INTRAVENOUS
  Filled 2019-07-01: qty 16

## 2019-07-01 MED ORDER — SODIUM CHLORIDE 0.9% FLUSH
10.0000 mL | Freq: Once | INTRAVENOUS | Status: AC
  Administered 2019-07-01: 10 mL
  Filled 2019-07-01: qty 10

## 2019-07-01 MED ORDER — HEPARIN SOD (PORK) LOCK FLUSH 100 UNIT/ML IV SOLN
500.0000 [IU] | Freq: Once | INTRAVENOUS | Status: AC | PRN
  Administered 2019-07-01: 500 [IU]
  Filled 2019-07-01: qty 5

## 2019-07-01 MED ORDER — SODIUM CHLORIDE 0.9 % IV SOLN
125.0000 mg | Freq: Once | INTRAVENOUS | Status: AC
  Administered 2019-07-01: 125 mg via INTRAVENOUS
  Filled 2019-07-01: qty 10

## 2019-07-01 MED ORDER — SODIUM CHLORIDE 0.9 % IV SOLN
Freq: Once | INTRAVENOUS | Status: AC
  Filled 2019-07-01: qty 250

## 2019-07-01 MED ORDER — SODIUM CHLORIDE 0.9% FLUSH
10.0000 mL | INTRAVENOUS | Status: DC | PRN
  Administered 2019-07-01: 10 mL
  Filled 2019-07-01: qty 10

## 2019-07-01 NOTE — Patient Instructions (Signed)

## 2019-07-01 NOTE — Patient Instructions (Signed)
[facility] Discharge Instructions for Patients Receiving Chemotherapy  Today you received the following chemotherapy agents: bevacizumab.  To help prevent nausea and vomiting after your treatment, we encourage you to take your nausea medication as directed.   If you develop nausea and vomiting that is not controlled by your nausea medication, call the clinic.   BELOW ARE SYMPTOMS THAT SHOULD BE REPORTED IMMEDIATELY:  *FEVER GREATER THAN 100.5 F  *CHILLS WITH OR WITHOUT FEVER  NAUSEA AND VOMITING THAT IS NOT CONTROLLED WITH YOUR NAUSEA MEDICATION  *UNUSUAL SHORTNESS OF BREATH  *UNUSUAL BRUISING OR BLEEDING  TENDERNESS IN MOUTH AND THROAT WITH OR WITHOUT PRESENCE OF ULCERS  *URINARY PROBLEMS  *BOWEL PROBLEMS  UNUSUAL RASH Items with * indicate a potential emergency and should be followed up as soon as possible.  Feel free to call the clinic should you have any questions or concerns. The clinic phone number is [phone].  Please show the CHEMO ALERT CARD at check-in to the Emergency Department and triage nurse.  Sodium Ferric Gluconate Complex injection What is this medicine? SODIUM FERRIC GLUCONATE COMPLEX (SOE dee um FER ik GLOO koe nate KOM pleks) is an iron replacement. It is used with epoetin therapy to treat low iron levels in patients who are receiving hemodialysis. This medicine may be used for other purposes; ask your health care provider or pharmacist if you have questions. COMMON BRAND NAME(S): Ferrlecit, Nulecit What should I tell my health care provider before I take this medicine? They need to know if you have any of the following conditions:  anemia that is not from iron deficiency  high levels of iron in the body  an unusual or allergic reaction to iron, benzyl alcohol, other medicines, foods, dyes, or preservatives  pregnant or are trying to become pregnant  breast-feeding How should I use this medicine? This medicine is for  infusion into a vein. It is given by a health care professional in a hospital or clinic setting. Talk to your pediatrician regarding the use of this medicine in children. While this drug may be prescribed for children as young as 6 years old for selected conditions, precautions do apply. Overdosage: If you think you have taken too much of this medicine contact a poison control center or emergency room at once. NOTE: This medicine is only for you. Do not share this medicine with others. What if I miss a dose? It is important not to miss your dose. Call your doctor or health care professional if you are unable to keep an appointment. What may interact with this medicine? Do not take this medicine with any of the following medications:  deferoxamine  dimercaprol  other iron products This medicine may also interact with the following medications:  chloramphenicol  deferasirox  medicine for blood pressure like enalapril This list may not describe all possible interactions. Give your health care provider a list of all the medicines, herbs, non-prescription drugs, or dietary supplements you use. Also tell them if you smoke, drink alcohol, or use illegal drugs. Some items may interact with your medicine. What should I watch for while using this medicine? Your condition will be monitored carefully while you are receiving this medicine. Visit your doctor for check-ups as directed. What side effects may I notice from receiving this medicine? Side effects that you should report to your doctor or health care professional as soon as possible:  allergic reactions like skin rash, itching or hives, swelling of the face, lips, or tongue    breathing problems  changes in hearing  changes in vision  chills, flushing, or sweating  fast, irregular heartbeat  feeling faint or lightheaded, falls  fever, flu-like symptoms  high or low blood pressure  pain, tingling, numbness in the hands or  feet  severe pain in the chest, back, flanks, or groin  swelling of the ankles, feet, hands  trouble passing urine or change in the amount of urine  unusually weak or tired Side effects that usually do not require medical attention (report to your doctor or health care professional if they continue or are bothersome):  cramps  dark colored stools  diarrhea  headache  nausea, vomiting  stomach upset This list may not describe all possible side effects. Call your doctor for medical advice about side effects. You may report side effects to FDA at 1-800-FDA-1088. Where should I keep my medicine? This drug is given in a hospital or clinic and will not be stored at home. NOTE: This sheet is a summary. It may not cover all possible information. If you have questions about this medicine, talk to your doctor, pharmacist, or health care provider.  2020 Elsevier/Gold Standard (2007-12-02 15:58:57)  

## 2019-07-01 NOTE — Progress Notes (Signed)
Patient presents to treatment today with c/o left upper arm pain and "swelling" x 2-3 days. Pain worse with movement and palpation. On visual assessment, this RN did not appreciate any obvious swelling. Patient denies injury. Sandi Mealy, PA-C aware and will see patient in infusion area.

## 2019-07-02 DIAGNOSIS — M1990 Unspecified osteoarthritis, unspecified site: Secondary | ICD-10-CM | POA: Diagnosis not present

## 2019-07-02 NOTE — Progress Notes (Signed)
Was asked to see this patient in the infusion room when she reported having left shoulder pain.  She has had over the past several days.  She denies any changes in activity or trauma.  She was told to use Motrin and heat as well as to rest her left upper extremity for several days.  Her exam showed tenderness over the insertion of the biceps tendon.  No other issues were noted.  The patient expresses understanding and agreement with this plan.  Sandi Mealy, MHS, PA-C Physician Assistant

## 2019-07-05 DIAGNOSIS — M1990 Unspecified osteoarthritis, unspecified site: Secondary | ICD-10-CM | POA: Diagnosis not present

## 2019-07-06 DIAGNOSIS — M1990 Unspecified osteoarthritis, unspecified site: Secondary | ICD-10-CM | POA: Diagnosis not present

## 2019-07-07 ENCOUNTER — Telehealth: Payer: Self-pay

## 2019-07-07 DIAGNOSIS — M1990 Unspecified osteoarthritis, unspecified site: Secondary | ICD-10-CM | POA: Diagnosis not present

## 2019-07-07 NOTE — Telephone Encounter (Signed)
-----   Message from Truitt Merle, MD sent at 07/06/2019 11:19 AM EDT ----- Please let pt know her K was low last week, and encourage her to take KCL supplement (on her med list), and K rich food, thanks   Truitt Merle  07/06/2019

## 2019-07-07 NOTE — Telephone Encounter (Signed)
Attempted to call patient. Unable to leave vm 

## 2019-07-08 ENCOUNTER — Other Ambulatory Visit: Payer: Self-pay | Admitting: Internal Medicine

## 2019-07-08 ENCOUNTER — Ambulatory Visit: Payer: Medicaid Other

## 2019-07-08 ENCOUNTER — Other Ambulatory Visit: Payer: Medicaid Other

## 2019-07-08 DIAGNOSIS — M1990 Unspecified osteoarthritis, unspecified site: Secondary | ICD-10-CM | POA: Diagnosis not present

## 2019-07-08 DIAGNOSIS — R05 Cough: Secondary | ICD-10-CM

## 2019-07-08 DIAGNOSIS — R059 Cough, unspecified: Secondary | ICD-10-CM

## 2019-07-08 NOTE — Telephone Encounter (Signed)
I spoke with [name].  She is not taking the K Dur at this time. I offered to refill rx but she said she still had some.  I told her to please start and we will check her K level at next visit.  She verbalized understanding

## 2019-07-09 DIAGNOSIS — M1990 Unspecified osteoarthritis, unspecified site: Secondary | ICD-10-CM | POA: Diagnosis not present

## 2019-07-09 NOTE — Progress Notes (Signed)
These preliminary result these preliminary results were noted.  Awaiting final report.

## 2019-07-09 NOTE — Progress Notes (Signed)
Pharmacist Chemotherapy Monitoring - Follow Up Assessment    I verify that I have reviewed each item in the below checklist:  . Regimen for the patient is scheduled for the appropriate day and plan matches scheduled date. [name] Appropriate non-routine labs are ordered dependent on drug ordered. . If applicable, additional medications reviewed and ordered per protocol based on lifetime cumulative doses and/or treatment regimen.   Plan for follow-up and/or issues identified: Yes . I-vent associated with next due treatment: Yes . MD and/or nursing notified: No    Kennith Center, Pharm.D., CPP 07/09/2019@9 :16 AM

## 2019-07-12 DIAGNOSIS — M1990 Unspecified osteoarthritis, unspecified site: Secondary | ICD-10-CM | POA: Diagnosis not present

## 2019-07-13 ENCOUNTER — Other Ambulatory Visit: Payer: Self-pay

## 2019-07-13 ENCOUNTER — Encounter: Payer: Self-pay | Admitting: Podiatry

## 2019-07-13 ENCOUNTER — Ambulatory Visit: Payer: Medicaid Other | Admitting: Podiatry

## 2019-07-13 VITALS — Temp 97.4°F

## 2019-07-13 DIAGNOSIS — B351 Tinea unguium: Secondary | ICD-10-CM

## 2019-07-13 DIAGNOSIS — M1990 Unspecified osteoarthritis, unspecified site: Secondary | ICD-10-CM | POA: Diagnosis not present

## 2019-07-13 DIAGNOSIS — M79675 Pain in left toe(s): Secondary | ICD-10-CM

## 2019-07-13 DIAGNOSIS — E1159 Type 2 diabetes mellitus with other circulatory complications: Secondary | ICD-10-CM

## 2019-07-13 DIAGNOSIS — M79674 Pain in right toe(s): Secondary | ICD-10-CM | POA: Diagnosis not present

## 2019-07-13 DIAGNOSIS — L851 Acquired keratosis [keratoderma] palmaris et plantaris: Secondary | ICD-10-CM

## 2019-07-13 NOTE — Progress Notes (Signed)
This patient presents the office today for continued treatment of her long thick nails.  These nails are painful walking wearing her shoes.  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  She says she made an appointment with a dermatologist and is taking an unknown medication in an effort to eliminate severe plantar keratomas both feet.  She says she is unaware of any diagnosis by the dermatologist.  She says that she feels there is no improvement since she started to take the medication.  She presents the office today for preventative foot care services for her nails.  This patient has history of DVT and venous stasis dermatitis.    Vascular  Dorsalis pedis and posterior tibial pulses are palpable  B/L.  Capillary return  WNL.  Temperature gradient is  WNL.  Skin turgor  WNL  Sensorium  Senn Weinstein monofilament wire  WNL. Normal tactile sensation.  Nail Exam  Patient has normal nails with no evidence of bacterial or fungal infection.  Orthopedic  Exam  Muscle tone and muscle strength  WNL.  No limitations of motion feet  B/L.  No crepitus or joint effusion noted.  Foot type is unremarkable and digits show no abnormalities.  Bony prominences are unremarkable.  Skin  No open lesions.  Normal skin texture and turgor. Plantar keratoderma heels and forefeet  B/L  Dystropic nails  B/L  ROV.  After my initial meeting with this patient I proceeded to look up and found the dermatologist that she saw was a dermatologist [facility].  She was also given a prescription of soriative for plaque psoriasis.  She is scheduled another visit with this doctor in April.  Debridement of her dystropic nails were performed.    RTC 3 months.   Gardiner Barefoot DPM

## 2019-07-14 DIAGNOSIS — M1990 Unspecified osteoarthritis, unspecified site: Secondary | ICD-10-CM | POA: Diagnosis not present

## 2019-07-15 ENCOUNTER — Inpatient Hospital Stay: Payer: Medicaid Other

## 2019-07-15 ENCOUNTER — Other Ambulatory Visit: Payer: Self-pay

## 2019-07-15 ENCOUNTER — Inpatient Hospital Stay: Payer: Medicaid Other | Attending: Hematology

## 2019-07-15 VITALS — BP 146/78 | HR 66 | Temp 98.6°F | Resp 18 | Wt 260.0 lb

## 2019-07-15 DIAGNOSIS — D500 Iron deficiency anemia secondary to blood loss (chronic): Secondary | ICD-10-CM

## 2019-07-15 DIAGNOSIS — I780 Hereditary hemorrhagic telangiectasia: Secondary | ICD-10-CM

## 2019-07-15 DIAGNOSIS — Z5112 Encounter for antineoplastic immunotherapy: Secondary | ICD-10-CM | POA: Diagnosis not present

## 2019-07-15 DIAGNOSIS — M1990 Unspecified osteoarthritis, unspecified site: Secondary | ICD-10-CM | POA: Diagnosis not present

## 2019-07-15 DIAGNOSIS — Z95828 Presence of other vascular implants and grafts: Secondary | ICD-10-CM

## 2019-07-15 DIAGNOSIS — Z79899 Other long term (current) drug therapy: Secondary | ICD-10-CM | POA: Diagnosis not present

## 2019-07-15 LAB — IRON AND TIBC
Iron: 21 ug/dL — ABNORMAL LOW (ref 41–142)
Saturation Ratios: 7 % — ABNORMAL LOW (ref 21–57)
TIBC: 286 ug/dL (ref 236–444)
UIBC: 265 ug/dL (ref 120–384)

## 2019-07-15 LAB — CMP (CANCER CENTER ONLY)
ALT: 8 U/L (ref 0–44)
AST: 16 U/L (ref 15–41)
Albumin: 3.8 g/dL (ref 3.5–5.0)
Alkaline Phosphatase: 81 U/L (ref 38–126)
Anion gap: 11 (ref 5–15)
BUN: 10 mg/dL (ref 6–20)
CO2: 24 mmol/L (ref 22–32)
Calcium: 9.1 mg/dL (ref 8.9–10.3)
Chloride: 106 mmol/L (ref 98–111)
Creatinine: 0.81 mg/dL (ref 0.44–1.00)
GFR, Est AFR Am: >60 mL/min (ref >60)
GFR, Estimated: >60 mL/min (ref >60)
Glucose, Bld: 114 mg/dL — ABNORMAL HIGH (ref 70–99)
Potassium: 3.5 mmol/L (ref 3.5–5.1)
Sodium: 141 mmol/L (ref 135–145)
Total Bilirubin: 0.2 mg/dL — ABNORMAL LOW (ref 0.3–1.2)
Total Protein: 8 g/dL (ref 6.5–8.1)

## 2019-07-15 LAB — CBC WITH DIFFERENTIAL (CANCER CENTER ONLY)
Abs Immature Granulocytes: 0.04 10*3/uL (ref 0.00–0.07)
Basophils Absolute: 0 10*3/uL (ref 0.0–0.1)
Basophils Relative: 0 %
Eosinophils Absolute: 0.2 10*3/uL (ref 0.0–0.5)
Eosinophils Relative: 1 %
HCT: 31.1 % — ABNORMAL LOW (ref 36.0–46.0)
Hemoglobin: 9.2 g/dL — ABNORMAL LOW (ref 12.0–15.0)
Immature Granulocytes: 0 %
Lymphocytes Relative: 15 %
Lymphs Abs: 1.8 10*3/uL (ref 0.7–4.0)
MCH: 24.1 pg — ABNORMAL LOW (ref 26.0–34.0)
MCHC: 29.6 g/dL — ABNORMAL LOW (ref 30.0–36.0)
MCV: 81.6 fL (ref 80.0–100.0)
Monocytes Absolute: 0.6 10*3/uL (ref 0.1–1.0)
Monocytes Relative: 5 %
Neutro Abs: 9.7 10*3/uL — ABNORMAL HIGH (ref 1.7–7.7)
Neutrophils Relative %: 79 %
Platelet Count: 304 10*3/uL (ref 150–400)
RBC: 3.81 MIL/uL — ABNORMAL LOW (ref 3.87–5.11)
RDW: 22.5 % — ABNORMAL HIGH (ref 11.5–15.5)
WBC Count: 12.3 10*3/uL — ABNORMAL HIGH (ref 4.0–10.5)
nRBC: 0 % (ref 0.0–0.2)

## 2019-07-15 LAB — TOTAL PROTEIN, URINE DIPSTICK: Protein, ur: 30 mg/dL — AB

## 2019-07-15 LAB — FERRITIN: Ferritin: 45 ng/mL (ref 11–307)

## 2019-07-15 MED ORDER — SODIUM CHLORIDE 0.9% FLUSH
10.0000 mL | INTRAVENOUS | Status: DC | PRN
  Administered 2019-07-15: 10 mL
  Filled 2019-07-15: qty 10

## 2019-07-15 MED ORDER — SODIUM CHLORIDE 0.9 % IV SOLN
125.0000 mg | Freq: Once | INTRAVENOUS | Status: AC
  Administered 2019-07-15: 125 mg via INTRAVENOUS
  Filled 2019-07-15: qty 10

## 2019-07-15 MED ORDER — SODIUM CHLORIDE 0.9% FLUSH
10.0000 mL | Freq: Once | INTRAVENOUS | Status: AC
  Administered 2019-07-15: 13:00:00 10 mL
  Filled 2019-07-15: qty 10

## 2019-07-15 MED ORDER — SODIUM CHLORIDE 0.9 % IV SOLN
5.0000 mg/kg | Freq: Once | INTRAVENOUS | Status: AC
  Administered 2019-07-15: 600 mg via INTRAVENOUS
  Filled 2019-07-15: qty 16

## 2019-07-15 MED ORDER — HEPARIN SOD (PORK) LOCK FLUSH 100 UNIT/ML IV SOLN
500.0000 [IU] | Freq: Once | INTRAVENOUS | Status: AC | PRN
  Administered 2019-07-15: 500 [IU]
  Filled 2019-07-15: qty 5

## 2019-07-15 MED ORDER — SODIUM CHLORIDE 0.9 % IV SOLN
Freq: Once | INTRAVENOUS | Status: AC
  Filled 2019-07-15: qty 250

## 2019-07-15 NOTE — Patient Instructions (Signed)
[place] Discharge Instructions for Patients Receiving Chemotherapy  Today you received the following chemotherapy agents: Bevacizumab  To help prevent nausea and vomiting after your treatment, we encourage you to take your nausea medication as directed.   If you develop nausea and vomiting that is not controlled by your nausea medication, call the clinic.   BELOW ARE SYMPTOMS THAT SHOULD BE REPORTED IMMEDIATELY:  *FEVER GREATER THAN 100.5 F  *CHILLS WITH OR WITHOUT FEVER  NAUSEA AND VOMITING THAT IS NOT CONTROLLED WITH YOUR NAUSEA MEDICATION  *UNUSUAL SHORTNESS OF BREATH  *UNUSUAL BRUISING OR BLEEDING  TENDERNESS IN MOUTH AND THROAT WITH OR WITHOUT PRESENCE OF ULCERS  *URINARY PROBLEMS  *BOWEL PROBLEMS  UNUSUAL RASH Items with * indicate a potential emergency and should be followed up as soon as possible.  Feel free to call the clinic should you have any questions or concerns. The clinic phone number is [phone].  Please show the [place] at check-in to the Emergency Department and triage nurse.  Sodium Ferric Gluconate Complex injection What is this medicine? SODIUM FERRIC GLUCONATE COMPLEX (SOE dee um FER ik GLOO koe nate KOM pleks) is an iron replacement. It is used with epoetin therapy to treat low iron levels in patients who are receiving hemodialysis. This medicine may be used for other purposes; ask your health care provider or pharmacist if you have questions. COMMON BRAND NAME(S): Ferrlecit, Nulecit What should I tell my health care provider before I take this medicine? They need to know if you have any of the following conditions:  anemia that is not from iron deficiency  high levels of iron in the body  an unusual or allergic reaction to iron, benzyl alcohol, other medicines, foods, dyes, or preservatives  pregnant or are trying to become pregnant  breast-feeding How should I use this medicine? This medicine is for  infusion into a vein. It is given by a health care professional in a hospital or clinic setting. Talk to your pediatrician regarding the use of this medicine in children. While this drug may be prescribed for children as young as 40 years old for selected conditions, precautions do apply. Overdosage: If you think you have taken too much of this medicine contact a poison control center or emergency room at once. NOTE: This medicine is only for you. Do not share this medicine with others. What if I miss a dose? It is important not to miss your dose. Call your doctor or health care professional if you are unable to keep an appointment. What may interact with this medicine? Do not take this medicine with any of the following medications:  deferoxamine  dimercaprol  other iron products This medicine may also interact with the following medications:  chloramphenicol  deferasirox  medicine for blood pressure like enalapril This list may not describe all possible interactions. Give your health care provider a list of all the medicines, herbs, non-prescription drugs, or dietary supplements you use. Also tell them if you smoke, drink alcohol, or use illegal drugs. Some items may interact with your medicine. What should I watch for while using this medicine? Your condition will be monitored carefully while you are receiving this medicine. Visit your doctor for check-ups as directed. What side effects may I notice from receiving this medicine? Side effects that you should report to your doctor or health care professional as soon as possible:  allergic reactions like skin rash, itching or hives, swelling of the face, lips, or tongue  breathing problems  changes in hearing  changes in vision  chills, flushing, or sweating  fast, irregular heartbeat  feeling faint or lightheaded, falls  fever, flu-like symptoms  high or low blood pressure  pain, tingling, numbness in the hands or  feet  severe pain in the chest, back, flanks, or groin  swelling of the ankles, feet, hands  trouble passing urine or change in the amount of urine  unusually weak or tired Side effects that usually do not require medical attention (report to your doctor or health care professional if they continue or are bothersome):  cramps  dark colored stools  diarrhea  headache  nausea, vomiting  stomach upset This list may not describe all possible side effects. Call your doctor for medical advice about side effects. You may report side effects to FDA at 1-800-FDA-1088. Where should I keep my medicine? This drug is given in a hospital or clinic and will not be stored at home. NOTE: This sheet is a summary. It may not cover all possible information. If you have questions about this medicine, talk to your doctor, pharmacist, or health care provider.  2020 Elsevier/Gold Standard (2007-12-02 15:58:57)

## 2019-07-16 DIAGNOSIS — M1990 Unspecified osteoarthritis, unspecified site: Secondary | ICD-10-CM | POA: Diagnosis not present

## 2019-07-19 DIAGNOSIS — M1990 Unspecified osteoarthritis, unspecified site: Secondary | ICD-10-CM | POA: Diagnosis not present

## 2019-07-20 DIAGNOSIS — M1990 Unspecified osteoarthritis, unspecified site: Secondary | ICD-10-CM | POA: Diagnosis not present

## 2019-07-21 DIAGNOSIS — M1990 Unspecified osteoarthritis, unspecified site: Secondary | ICD-10-CM | POA: Diagnosis not present

## 2019-07-22 ENCOUNTER — Ambulatory Visit: Payer: Medicaid Other

## 2019-07-22 ENCOUNTER — Ambulatory Visit: Payer: Medicaid Other | Admitting: Hematology

## 2019-07-22 ENCOUNTER — Other Ambulatory Visit: Payer: Medicaid Other

## 2019-07-22 DIAGNOSIS — M1990 Unspecified osteoarthritis, unspecified site: Secondary | ICD-10-CM | POA: Diagnosis not present

## 2019-07-23 DIAGNOSIS — M1990 Unspecified osteoarthritis, unspecified site: Secondary | ICD-10-CM | POA: Diagnosis not present

## 2019-07-23 NOTE — Progress Notes (Signed)
Pharmacist Chemotherapy Monitoring - Follow Up Assessment    I verify that I have reviewed each item in the below checklist:  . Regimen for the patient is scheduled for the appropriate day and plan matches scheduled date. [name] Appropriate non-routine labs are ordered dependent on drug ordered. . If applicable, additional medications reviewed and ordered per protocol based on lifetime cumulative doses and/or treatment regimen.   Plan for follow-up and/or issues identified: Yes . I-vent associated with next due treatment: Yes . MD and/or nursing notified: Yes - need orders placed  Adelina Mings 07/23/2019 9:57 AM

## 2019-07-23 NOTE — Progress Notes (Signed)
[place]   [phone] [phone]   Clinic Follow up Note   Patient Care Team: [name], MD as PCP - General (Internal Medicine)  Date of Service:  07/29/2019  CHIEF COMPLAINT: F/uof HHT andAnemia   PREVIOUS THERAPY:  -Multiple EGD, small bowel enteroscope, C-scope with APC -Oral and iv amicar were given during hospital stay, no effect -IR embolization ofof the left gastric and short gastric artery branch vessels without evidence of residual flow in the Dieulafoy lesionon 12/10/17 -Avastin on 8/2 and 8/30 at [facility]; starting 12/26/17 continue 5mg /kg q2 weeksuntil 02/20/18, restarted on 04/03/2018 -Tamoxifen started in 10/2017 for HHTstopped 12/10/18 due toDVT  CURRENT THERAPY: -Blood transfusion for severe anemia secondary to GI bleedingas needed -ivferric gluconate every1-2 weeks as neededwith ferritin goal 100-200. Increased to weekly on 07/29/19. -RestartedAvastin q2weeks on 04/03/18 -Amicar 1g bidstarting4/06/2018. Reduced to only as needed on 12/10/18 due toDVT   INTERVAL HISTORY:  Peggy House is here for a follow up of HHT and Anemia. She presents to the clinic alone. She notes she is well. She notes she gets tired but able to keep going with activities. She has epistaxis, last time 4 days ago for 1 hours. She tried pressure to bridge of her nose. She denies recent GI bleeding. She does note feeling cold with chills at times.    REVIEW OF SYSTEMS:   Constitutional: Denies fevers, chills or abnormal weight loss (+) chilly/cold feeling Eyes: Denies blurriness of vision Ears, nose, mouth, throat, and face: Denies mucositis or sore throat (+) Epistaxis  Respiratory: Denies cough, dyspnea or wheezes  Cardiovascular: Denies palpitation, chest discomfort or lower extremity swelling Gastrointestinal:  Denies nausea, heartburn or change in bowel habits Skin: Denies abnormal skin rashes Lymphatics: Denies new  lymphadenopathy or easy bruising Neurological:Denies numbness, tingling or new weaknesses Behavioral/Psych: Mood is stable, no new changes  All other systems were reviewed with the patient and are negative.  MEDICAL HISTORY:  Past Medical History:  Diagnosis Date  . Anxiety   . Arthritis    knnes,back  . GERD (gastroesophageal reflux disease)   . 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 History of swelling of feet   . Hyperlipidemia   . Hypertension   . Major depressive disorder, recurrent episode ([place]) 06/05/2015  . Obesity   . Snores   . 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 ABDOMINAL HYSTERECTOMY    . CARPAL TUNNEL RELEASE  05/13/2011   Procedure: CARPAL TUNNEL RELEASE;  Surgeon: Nita Sells, MD;  Location: [place];  Service: Orthopedics;  Laterality: Left;  . COLONOSCOPY WITH PROPOFOL N/A 04/28/2014   Procedure: COLONOSCOPY WITH PROPOFOL;  Surgeon: Cleotis Nipper, MD;  Location: [facility] ENDOSCOPY;  Service: Endoscopy;  Laterality: N/A;  . DG TOES*L*  2/10   rt  . DILATION AND CURETTAGE OF UTERUS    . ENTEROSCOPY N/A 10/17/2017   Procedure: ENTEROSCOPY;  Surgeon: Otis Brace, MD;  Location: [place] ENDOSCOPY;  Service: Gastroenterology;  Laterality: N/A;  . ESOPHAGOGASTRODUODENOSCOPY N/A 04/10/2014   Procedure: ESOPHAGOGASTRODUODENOSCOPY (EGD);  Surgeon: [name], MD;  Location: [facility] ENDOSCOPY;  Service: Endoscopy;  Laterality: N/A;  . ESOPHAGOGASTRODUODENOSCOPY N/A 05/10/2017   Procedure: ESOPHAGOGASTRODUODENOSCOPY (EGD);  Surgeon: Ronald Lobo, MD;  [facility];  Service: Endoscopy;  Laterality: N/A;  . ESOPHAGOGASTRODUODENOSCOPY N/A 09/22/2017   Procedure: ESOPHAGOGASTRODUODENOSCOPY (EGD);  Surgeon: Clarene Essex, MD;  Location: [facility] ENDOSCOPY;  Service: Endoscopy;  Laterality: N/A;  bedside  . ESOPHAGOGASTRODUODENOSCOPY  (EGD) WITH PROPOFOL N/A 04/27/2014   Procedure: ESOPHAGOGASTRODUODENOSCOPY (EGD) WITH PROPOFOL;  Surgeon: Cleotis Nipper, MD;  Location: [place];  Service: Endoscopy;  Laterality: N/A;  possible apc  . ESOPHAGOGASTRODUODENOSCOPY (EGD) WITH PROPOFOL N/A 09/30/2017   Procedure: ESOPHAGOGASTRODUODENOSCOPY (EGD) WITH PROPOFOL;  Surgeon: [name], MD;  Location: [place];  Service: Gastroenterology;  Laterality: N/A;  . ESOPHAGOGASTRODUODENOSCOPY (EGD) WITH PROPOFOL N/A 10/01/2017   Procedure: ESOPHAGOGASTRODUODENOSCOPY (EGD) WITH PROPOFOL;  Surgeon: [name], MD;  Location: [place];  Service: Gastroenterology;  Laterality: N/A;  . ESOPHAGOGASTRODUODENOSCOPY (EGD) WITH PROPOFOL N/A 10/08/2017   Procedure: ESOPHAGOGASTRODUODENOSCOPY (EGD) WITH PROPOFOL;  Surgeon: Otis Brace, MD;  Location: [facility] ENDOSCOPY;  Service: Gastroenterology;  Laterality: N/A;  . ESOPHAGOGASTRODUODENOSCOPY (EGD) WITH PROPOFOL N/A 10/17/2017   Procedure: ESOPHAGOGASTRODUODENOSCOPY (EGD) WITH PROPOFOL;  Surgeon: Otis Brace, MD;  Location: [facility] ENDOSCOPY;  Service: Gastroenterology;  Laterality: N/A;  . ESOPHAGOGASTRODUODENOSCOPY (EGD) WITH PROPOFOL N/A 10/19/2017   Procedure: ESOPHAGOGASTRODUODENOSCOPY (EGD) WITH PROPOFOL;  Surgeon: Otis Brace, MD;  Location: [facility] ENDOSCOPY;  Service: Gastroenterology;  Laterality: N/A;  . ESOPHAGOGASTRODUODENOSCOPY (EGD) WITH PROPOFOL N/A 12/04/2018   Procedure: ESOPHAGOGASTRODUODENOSCOPY (EGD) WITH PROPOFOL;  Surgeon: Wilford Corner, MD;  Location: [facility] ENDOSCOPY;  Service: Endoscopy;  Laterality: N/A;  . GIVENS CAPSULE STUDY N/A 10/02/2017   Procedure: GIVENS CAPSULE STUDY;  Surgeon: [name], MD;  Location: [place];  Service: Gastroenterology;  Laterality: N/A;  . GIVENS CAPSULE STUDY N/A 10/08/2017   Procedure: GIVENS CAPSULE STUDY;  Surgeon: Otis Brace, MD;  Location: [place];  Service: Gastroenterology;  Laterality: N/A;  endoscopic placement of  capsule  . HEMOSTASIS CLIP PLACEMENT  12/04/2018   Procedure: HEMOSTASIS CLIP PLACEMENT;  Surgeon: Wilford Corner, MD;  Location: [facility] ENDOSCOPY;  Service: Endoscopy;;  . HOT HEMOSTASIS N/A 04/27/2014   Procedure: HOT HEMOSTASIS (ARGON PLASMA COAGULATION/BICAP);  Surgeon: Cleotis Nipper, MD;  Location: [facility] ENDOSCOPY;  Service: Endoscopy;  Laterality: N/A;  . HOT HEMOSTASIS N/A 09/30/2017   Procedure: HOT HEMOSTASIS (ARGON PLASMA COAGULATION/BICAP);  Surgeon: [name], MD;  Location: [place];  Service: Gastroenterology;  Laterality: N/A;  . HOT HEMOSTASIS N/A 10/01/2017   Procedure: HOT HEMOSTASIS (ARGON PLASMA COAGULATION/BICAP);  Surgeon: [name], MD;  Location: [place];  Service: Gastroenterology;  Laterality: N/A;  . HOT HEMOSTASIS N/A 10/17/2017   Procedure: HOT HEMOSTASIS (ARGON PLASMA COAGULATION/BICAP);  Surgeon: Otis Brace, MD;  Location: [facility] ENDOSCOPY;  Service: Gastroenterology;  Laterality: N/A;  . HOT HEMOSTASIS N/A 10/19/2017   Procedure: HOT HEMOSTASIS (ARGON PLASMA COAGULATION/BICAP);  Surgeon: Otis Brace, MD;  Location: [facility] ENDOSCOPY;  Service: Gastroenterology;  Laterality: N/A;  . IR IMAGING GUIDED PORT INSERTION  07/08/2018  . L shoulder Surgery  2011  . SUBMUCOSAL INJECTION  09/22/2017   Procedure: SUBMUCOSAL INJECTION;  Surgeon: Clarene Essex, MD;  Location: [facility] ENDOSCOPY;  Service: Endoscopy;;  . SUBMUCOSAL INJECTION  12/04/2018   Procedure: SUBMUCOSAL INJECTION;  Surgeon: Wilford Corner, MD;  Location: [facility] ENDOSCOPY;  Service: Endoscopy;;    I have reviewed the social history and family history with the patient and they are unchanged from previous note.  ALLERGIES:  is allergic to feraheme [ferumoxytol]; nsaids; tomato; and wasp venom.  MEDICATIONS:  Current Outpatient Medications  Medication Sig Dispense Refill  . acitretin (SORIATANE) 10 MG capsule Take by mouth.    [name] albuterol (PROVENTIL HFA;VENTOLIN HFA) 108 (90 Base) MCG/ACT inhaler Inhale  1-2 puffs into the lungs every 6 (six)  hours as needed for wheezing or shortness of breath. 1 Inhaler 3  . ALPRAZolam (XANAX) 0.5 MG tablet TAKE 1 TABLET BY MOUTH 2 TIMES A DAY AS NEEDED FOR ANXIETY 60 tablet 0  . Aminocaproic Acid 1000 MG TABS TAKE 1 TABLET(1000 MG) BY MOUTH TWICE DAILY 60 tablet 1  . amLODipine (NORVASC) 10 MG tablet TAKE 1 TABLET(10 MG) BY MOUTH DAILY 90 tablet 0  . atorvastatin (LIPITOR) 80 MG tablet TAKE 1 TABLET(80 MG) BY MOUTH DAILY 30 tablet 3  . cetirizine (ZYRTEC) 10 MG tablet TAKE 1 TABLET(10 MG) BY MOUTH DAILY 90 tablet 1  . diphenhydramine-acetaminophen (TYLENOL PM) 25-500 MG TABS tablet Take 1 tablet by mouth at bedtime as needed (sleep).    . diphenoxylate-atropine (LOMOTIL) 2.5-0.025 MG tablet 1 to 2 PO QID prn diarrhea 30 tablet 1  . guaifenesin (ROBITUSSIN) 100 MG/5ML syrup Take 200 mg by mouth 3 (three) times daily as needed for cough or congestion.    . hydrochlorothiazide (HYDRODIURIL) 12.5 MG tablet TAKE 1 TABLET(12.5 MG) BY MOUTH DAILY 90 tablet 1  . lidocaine-prilocaine (EMLA) cream Apply 1 application topically as needed. 30 g 0  . metFORMIN (GLUCOPHAGE) 500 MG tablet TAKE 1 TABLET(500 MG) BY MOUTH TWICE DAILY WITH A MEAL 180 tablet 1  . pantoprazole (PROTONIX) 40 MG tablet Take 1 tablet (40 mg total) by mouth 2 (two) times daily. 60 tablet 5  . Podiatric Products (FLEXITOL HEEL BALM) OINT Apply topically.    . potassium chloride (KLOR-CON) 20 MEQ packet Take 20 mEq by mouth daily. 15 packet 0  . prochlorperazine (COMPAZINE) 10 MG tablet Take 1 tablet (10 mg total) by mouth every 6 (six) hours as needed for nausea or vomiting. 30 tablet 1  . sertraline (ZOLOFT) 100 MG tablet TAKE 1 TABLET(100 MG) BY MOUTH DAILY 90 tablet 1  . traZODone (DESYREL) 50 MG tablet TAKE 1 TABLET(50 MG) BY MOUTH AT BEDTIME 30 tablet 2   No current facility-administered medications for this visit.   Facility-Administered Medications Ordered in Other Visits  Medication Dose Route  Frequency Provider Last Rate Last Admin  . bevacizumab-bvzr (ZIRABEV) 600 mg in sodium chloride 0.9 % 100 mL chemo infusion  5 mg/kg (Treatment Plan Recorded) Intravenous Once Truitt Merle, MD      . ferric gluconate (NULECIT) 125 mg in sodium chloride 0.9 % 100 mL IVPB  125 mg Intravenous Once Truitt Merle, MD      . heparin lock flush 100 unit/mL  500 Units Intracatheter Once PRN Truitt Merle, MD      . sodium chloride flush (NS) 0.9 % injection 10 mL  10 mL Intracatheter PRN Truitt Merle, MD        PHYSICAL EXAMINATION: ECOG PERFORMANCE STATUS: 2 - Symptomatic, <50% confined to bed  Vitals:   07/29/19 1302  BP: (!) 149/74  Pulse: 66  Resp: 20  Temp: 98.7 F (37.1 C)  SpO2: 100%   Filed Weights   07/29/19 1302  Weight: 260 lb 4.8 oz (118.1 kg)    Due to COVID19 we will limit examination to appearance. Patient had no complaints.  GENERAL:alert, no distress and comfortable SKIN: skin color normal, no rashes or significant lesions EYES: normal, Conjunctiva are pink and non-injected, sclera clear  NEURO: alert & oriented x 3 with fluent speech   LABORATORY DATA:  I have reviewed the data as listed CBC Latest Ref Rng & Units 07/29/2019 07/15/2019 07/01/2019  WBC 4.0 - 10.5 K/uL 13.2(H) 12.3(H) 13.3(H)  Hemoglobin 12.0 -  15.0 g/dL 8.9(L) 9.2(L) 9.2(L)  Hematocrit 36.0 - 46.0 % 30.8(L) 31.1(L) 30.7(L)  Platelets 150 - 400 K/uL 227 304 266     CMP Latest Ref Rng & Units 07/29/2019 07/15/2019 07/01/2019  Glucose 70 - 99 mg/dL 114(H) 114(H) 103(H)  BUN 6 - 20 mg/dL 10 10 11   Creatinine 0.44 - 1.00 mg/dL 0.74 0.81 0.77  Sodium 135 - 145 mmol/L 144 141 141  Potassium 3.5 - 5.1 mmol/L 3.8 3.5 3.2(L)  Chloride 98 - 111 mmol/L 108 106 105  CO2 22 - 32 mmol/L 26 24 25   Calcium 8.9 - 10.3 mg/dL 9.5 9.1 9.3  Total Protein 6.5 - 8.1 g/dL 8.1 8.0 7.9  Total Bilirubin 0.3 - 1.2 mg/dL 0.4 0.2(L) 0.3  Alkaline Phos 38 - 126 U/L 77 81 76  AST 15 - 41 U/L 15 16 15   ALT 0 - 44 U/L 10 8 8        RADIOGRAPHIC STUDIES: I have personally reviewed the radiological images as listed and agreed with the findings in the report. No results found.   ASSESSMENT & PLAN:  Peggy House is a 59 y.o. female with    1. 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 Extensive workup found the pt to have HHT based on her personal and familyhistoryand recurrentAVM GI bleedings. -Sheis s/pIR embolization of the left Gastric and short gastric artery branchvesselswithout evidence of residual flow in the Dieulafoy lesionon 12/10/2017. -[facility] and then every 2 weeks9/13/19-11/8/19 -Shewas treated with Tamoxifen and Amicar for her HHT, but due to recent DVT, tamoxifen was stopped and she only takes Amicar as needed for severe epistaxis. -Her primary treatment will now be Avastin and IV Ironand blood transfusions as needed.We repeatedly discussedconsistency in treatments to manage her blood counts, she voices good understanding. -She did recently have moderate epistaxis, no recent GI bleeding. Her previous RUQ pain resolved after 1 week. Her energy is fair and stable. She often feels cold and chills which I discussed is from her anemia.  -Labs reviewed, WBC 13.2, HG 8.9, ANC 11, BG 114. Ferritin still pending. Will proceed with IV Ferric Gluconate and Avastin today. -Given lower dose Ferric Gluconate will increase to once weekly unless Ferritin >200. 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 -Although she has not had recent GI bleeding she still has occasional moderate epistaxis.  -Hg has been moderate and stable lately. Will increase frequency to IV Ferric Gluconate to help increase Hg. Due to recent chills likely from anemia, I recommend she take Prenatal Vitamin. She is agreeable.   3. Reactive Leukocytosis and reactive thrombocytosis  -She has mild leukocytosis with dominant neutrophils, likely reactive -Thrombocytosis recently resolved. Leukocytosis intermittent. Continue monitoring. -WBC and ANC improved, mild and stable, plt are normal.   4. 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 -I recommend she continue to follow up with her GI, Dr. Cristina Gong -She had severe GI bleeding which required hospitalization in 11/2018 with  several blood transfusions and EGD to cauterize her stomach bleeding. -I started heron Amicar 07/17/18. Given DVT she will only take Amicar for severe epistaxis -Her GI bleeding has resolved currently.But has been havingoccasional moderate epistaxis.I again reviewed, how to apply pressure to help slow and stop bleeding.   5.History of right Arm DVT -She had right arm DVT that extended to her right clavicleon 12/07/2018 when she was hospitalized for GI bleeding.It is possibly related to Amicar, tamoxifen and port. -She is not a candidate for anticoagulation due to severe GI bleeding -We stopped Tamoxifen, she will only use Amicar for severe epistaxis. Will maintain Port flushes.  6. Smoking cessation -She was smoking 10 cigarettes a day on average before recent hospitalization -I strongly encouraged her to stop smoking completely, she will try.  7. Hypokalemia -Onpotassium chloride 20 mEq daily, continue. Willmonitor -K normal recently.   8. Hyperglycemia, new onset  diabetes -Ipreviouslyadvised her to stop eating sweats and reduce her carbohydrate intake.  -Istarted her onMetformin BIDon11/5/20 to manage this.Continue to f/u with PCP to monitor herA1c and hyperglycemia. Has improved lately.   9. HTN, uncontrolled -Her BP has been intermittently elevated in clinic lately.  -I discussed Avastin can increase her BP. Will continue to monitor.  -I strongly encouraged her to keep up with her HTN medications daily to manage this. She understands.  10. Anxiety  -She notes having anxiety about her bleeding her family's health.  -She has currently on Xanax BID. I discussed regular use can cause dependence. I suggest she try to reduce to no more than once a day if needed. She will continue to get refills managed by her PCP.    PLAN: -Labs reviewed and adequate to proceed with Ferric Gluconate and avastin today  -Lab, flush, ferric gluconate weekly (holdironif ferritin >200 on last lab) -Continue Avastin every 2 weeks X4 -Start OTC prenatal Vitamin.  -f/u in 2 months    No problem-specific Assessment & Plan notes found for this encounter.   No orders of the defined types were placed in this encounter.  All questions were answered. The patient knows to call the clinic with any problems, questions or concerns. No barriers to learning was detected.       Truitt Merle, MD 07/29/2019   I, Joslyn Devon, am acting as scribe for Truitt Merle, MD.   I have reviewed the above documentation for accuracy and completeness, and I agree with the above.

## 2019-07-26 ENCOUNTER — Other Ambulatory Visit: Payer: Self-pay | Admitting: Hematology

## 2019-07-26 DIAGNOSIS — M1990 Unspecified osteoarthritis, unspecified site: Secondary | ICD-10-CM | POA: Diagnosis not present

## 2019-07-27 DIAGNOSIS — M1990 Unspecified osteoarthritis, unspecified site: Secondary | ICD-10-CM | POA: Diagnosis not present

## 2019-07-28 DIAGNOSIS — M1990 Unspecified osteoarthritis, unspecified site: Secondary | ICD-10-CM | POA: Diagnosis not present

## 2019-07-29 ENCOUNTER — Inpatient Hospital Stay: Payer: Medicaid Other

## 2019-07-29 ENCOUNTER — Encounter: Payer: Self-pay | Admitting: Hematology

## 2019-07-29 ENCOUNTER — Other Ambulatory Visit: Payer: Self-pay

## 2019-07-29 ENCOUNTER — Inpatient Hospital Stay (HOSPITAL_BASED_OUTPATIENT_CLINIC_OR_DEPARTMENT_OTHER): Payer: Medicaid Other | Admitting: Hematology

## 2019-07-29 VITALS — BP 146/73 | HR 72

## 2019-07-29 VITALS — BP 149/74 | HR 66 | Temp 98.7°F | Resp 20 | Ht 65.5 in | Wt 260.3 lb

## 2019-07-29 DIAGNOSIS — I780 Hereditary hemorrhagic telangiectasia: Secondary | ICD-10-CM

## 2019-07-29 DIAGNOSIS — D500 Iron deficiency anemia secondary to blood loss (chronic): Secondary | ICD-10-CM

## 2019-07-29 DIAGNOSIS — Z95828 Presence of other vascular implants and grafts: Secondary | ICD-10-CM

## 2019-07-29 DIAGNOSIS — Z5112 Encounter for antineoplastic immunotherapy: Secondary | ICD-10-CM | POA: Diagnosis not present

## 2019-07-29 DIAGNOSIS — M1990 Unspecified osteoarthritis, unspecified site: Secondary | ICD-10-CM | POA: Diagnosis not present

## 2019-07-29 LAB — CMP (CANCER CENTER ONLY)
ALT: 10 U/L (ref 0–44)
AST: 15 U/L (ref 15–41)
Albumin: 3.7 g/dL (ref 3.5–5.0)
Alkaline Phosphatase: 77 U/L (ref 38–126)
Anion gap: 10 (ref 5–15)
BUN: 10 mg/dL (ref 6–20)
CO2: 26 mmol/L (ref 22–32)
Calcium: 9.5 mg/dL (ref 8.9–10.3)
Chloride: 108 mmol/L (ref 98–111)
Creatinine: 0.74 mg/dL (ref 0.44–1.00)
GFR, Est AFR Am: >60 mL/min (ref >60)
GFR, Estimated: >60 mL/min (ref >60)
Glucose, Bld: 114 mg/dL — ABNORMAL HIGH (ref 70–99)
Potassium: 3.8 mmol/L (ref 3.5–5.1)
Sodium: 144 mmol/L (ref 135–145)
Total Bilirubin: 0.4 mg/dL (ref 0.3–1.2)
Total Protein: 8.1 g/dL (ref 6.5–8.1)

## 2019-07-29 LAB — CBC WITH DIFFERENTIAL (CANCER CENTER ONLY)
Abs Immature Granulocytes: 0.05 10*3/uL (ref 0.00–0.07)
Basophils Absolute: 0 10*3/uL (ref 0.0–0.1)
Basophils Relative: 0 %
Eosinophils Absolute: 0.1 10*3/uL (ref 0.0–0.5)
Eosinophils Relative: 1 %
HCT: 30.8 % — ABNORMAL LOW (ref 36.0–46.0)
Hemoglobin: 8.9 g/dL — ABNORMAL LOW (ref 12.0–15.0)
Immature Granulocytes: 0 %
Lymphocytes Relative: 11 %
Lymphs Abs: 1.5 10*3/uL (ref 0.7–4.0)
MCH: 23.9 pg — ABNORMAL LOW (ref 26.0–34.0)
MCHC: 28.9 g/dL — ABNORMAL LOW (ref 30.0–36.0)
MCV: 82.6 fL (ref 80.0–100.0)
Monocytes Absolute: 0.6 10*3/uL (ref 0.1–1.0)
Monocytes Relative: 4 %
Neutro Abs: 11 10*3/uL — ABNORMAL HIGH (ref 1.7–7.7)
Neutrophils Relative %: 84 %
Platelet Count: 227 10*3/uL (ref 150–400)
RBC: 3.73 MIL/uL — ABNORMAL LOW (ref 3.87–5.11)
RDW: 22.3 % — ABNORMAL HIGH (ref 11.5–15.5)
WBC Count: 13.2 10*3/uL — ABNORMAL HIGH (ref 4.0–10.5)
nRBC: 0 % (ref 0.0–0.2)

## 2019-07-29 LAB — FERRITIN: Ferritin: 56 ng/mL (ref 11–307)

## 2019-07-29 MED ORDER — SODIUM CHLORIDE 0.9 % IV SOLN
125.0000 mg | Freq: Once | INTRAVENOUS | Status: AC
  Administered 2019-07-29: 125 mg via INTRAVENOUS
  Filled 2019-07-29: qty 10

## 2019-07-29 MED ORDER — SODIUM CHLORIDE 0.9 % IV SOLN
5.0000 mg/kg | Freq: Once | INTRAVENOUS | Status: AC
  Administered 2019-07-29: 600 mg via INTRAVENOUS
  Filled 2019-07-29: qty 16

## 2019-07-29 MED ORDER — SODIUM CHLORIDE 0.9% FLUSH
10.0000 mL | INTRAVENOUS | Status: DC | PRN
  Administered 2019-07-29: 10 mL
  Filled 2019-07-29: qty 10

## 2019-07-29 MED ORDER — SODIUM CHLORIDE 0.9% FLUSH
10.0000 mL | Freq: Once | INTRAVENOUS | Status: AC
  Administered 2019-07-29: 10 mL
  Filled 2019-07-29: qty 10

## 2019-07-29 MED ORDER — SODIUM CHLORIDE 0.9 % IV SOLN
Freq: Once | INTRAVENOUS | Status: AC
  Filled 2019-07-29: qty 250

## 2019-07-29 MED ORDER — HEPARIN SOD (PORK) LOCK FLUSH 100 UNIT/ML IV SOLN
500.0000 [IU] | Freq: Once | INTRAVENOUS | Status: AC | PRN
  Administered 2019-07-29: 500 [IU]
  Filled 2019-07-29: qty 5

## 2019-07-29 NOTE — Patient Instructions (Signed)
[facility] Discharge Instructions for Patients Receiving Chemotherapy  Today you received the following chemotherapy agents: Bevacizumab  To help prevent nausea and vomiting after your treatment, we encourage you to take your nausea medication as directed.   If you develop nausea and vomiting that is not controlled by your nausea medication, call the clinic.   BELOW ARE SYMPTOMS THAT SHOULD BE REPORTED IMMEDIATELY:  *FEVER GREATER THAN 100.5 F  *CHILLS WITH OR WITHOUT FEVER  NAUSEA AND VOMITING THAT IS NOT CONTROLLED WITH YOUR NAUSEA MEDICATION  *UNUSUAL SHORTNESS OF BREATH  *UNUSUAL BRUISING OR BLEEDING  TENDERNESS IN MOUTH AND THROAT WITH OR WITHOUT PRESENCE OF ULCERS  *URINARY PROBLEMS  *BOWEL PROBLEMS  UNUSUAL RASH Items with * indicate a potential emergency and should be followed up as soon as possible.  Feel free to call the clinic should you have any questions or concerns. The clinic phone number is [phone].  Please show the [place] at check-in to the Emergency Department and triage nurse.  Sodium Ferric Gluconate Complex injection What is this medicine? SODIUM FERRIC GLUCONATE COMPLEX (SOE dee um FER ik GLOO koe nate KOM pleks) is an iron replacement. It is used with epoetin therapy to treat low iron levels in patients who are receiving hemodialysis. This medicine may be used for other purposes; ask your health care provider or pharmacist if you have questions. COMMON BRAND NAME(S): Ferrlecit, Nulecit What should I tell my health care provider before I take this medicine? They need to know if you have any of the following conditions:  anemia that is not from iron deficiency  high levels of iron in the body  an unusual or allergic reaction to iron, benzyl alcohol, other medicines, foods, dyes, or preservatives  pregnant or are trying to become pregnant  breast-feeding How should I use this medicine? This medicine is for  infusion into a vein. It is given by a health care professional in a hospital or clinic setting. Talk to your pediatrician regarding the use of this medicine in children. While this drug may be prescribed for children as young as 7 years old for selected conditions, precautions do apply. Overdosage: If you think you have taken too much of this medicine contact a poison control center or emergency room at once. NOTE: This medicine is only for you. Do not share this medicine with others. What if I miss a dose? It is important not to miss your dose. Call your doctor or health care professional if you are unable to keep an appointment. What may interact with this medicine? Do not take this medicine with any of the following medications:  deferoxamine  dimercaprol  other iron products This medicine may also interact with the following medications:  chloramphenicol  deferasirox  medicine for blood pressure like enalapril This list may not describe all possible interactions. Give your health care provider a list of all the medicines, herbs, non-prescription drugs, or dietary supplements you use. Also tell them if you smoke, drink alcohol, or use illegal drugs. Some items may interact with your medicine. What should I watch for while using this medicine? Your condition will be monitored carefully while you are receiving this medicine. Visit your doctor for check-ups as directed. What side effects may I notice from receiving this medicine? Side effects that you should report to your doctor or health care professional as soon as possible:  allergic reactions like skin rash, itching or hives, swelling of the face, lips, or tongue  breathing problems  changes in hearing  changes in vision  chills, flushing, or sweating  fast, irregular heartbeat  feeling faint or lightheaded, falls  fever, flu-like symptoms  high or low blood pressure  pain, tingling, numbness in the hands or  feet  severe pain in the chest, back, flanks, or groin  swelling of the ankles, feet, hands  trouble passing urine or change in the amount of urine  unusually weak or tired Side effects that usually do not require medical attention (report to your doctor or health care professional if they continue or are bothersome):  cramps  dark colored stools  diarrhea  headache  nausea, vomiting  stomach upset This list may not describe all possible side effects. Call your doctor for medical advice about side effects. You may report side effects to FDA at 1-800-FDA-1088. Where should I keep my medicine? This drug is given in a hospital or clinic and will not be stored at home. NOTE: This sheet is a summary. It may not cover all possible information. If you have questions about this medicine, talk to your doctor, pharmacist, or health care provider.  2020 Elsevier/Gold Standard (2007-12-02 15:58:57) Sodium Ferric Gluconate Complex injection What is this medicine? SODIUM FERRIC GLUCONATE COMPLEX (SOE dee um FER ik GLOO koe nate KOM pleks) is an iron replacement. It is used with epoetin therapy to treat low iron levels in patients who are receiving hemodialysis. This medicine may be used for other purposes; ask your health care provider or pharmacist if you have questions. COMMON BRAND NAME(S): Ferrlecit, Nulecit What should I tell my health care provider before I take this medicine? They need to know if you have any of the following conditions:  anemia that is not from iron deficiency  high levels of iron in the body  an unusual or allergic reaction to iron, benzyl alcohol, other medicines, foods, dyes, or preservatives  pregnant or are trying to become pregnant  breast-feeding How should I use this medicine? This medicine is for infusion into a vein. It is given by a health care professional in a hospital or clinic setting. Talk to your pediatrician regarding the use of this  medicine in children. While this drug may be prescribed for children as young as 79 years old for selected conditions, precautions do apply. Overdosage: If you think you have taken too much of this medicine contact a poison control center or emergency room at once. NOTE: This medicine is only for you. Do not share this medicine with others. What if I miss a dose? It is important not to miss your dose. Call your doctor or health care professional if you are unable to keep an appointment. What may interact with this medicine? Do not take this medicine with any of the following medications:  deferoxamine  dimercaprol  other iron products This medicine may also interact with the following medications:  chloramphenicol  deferasirox  medicine for blood pressure like enalapril This list may not describe all possible interactions. Give your health care provider a list of all the medicines, herbs, non-prescription drugs, or dietary supplements you use. Also tell them if you smoke, drink alcohol, or use illegal drugs. Some items may interact with your medicine. What should I watch for while using this medicine? Your condition will be monitored carefully while you are receiving this medicine. Visit your doctor for check-ups as directed. What side effects may I notice from receiving this medicine? Side effects that you should report to your doctor  or health care professional as soon as possible:  allergic reactions like skin rash, itching or hives, swelling of the face, lips, or tongue  breathing problems  changes in hearing  changes in vision  chills, flushing, or sweating  fast, irregular heartbeat  feeling faint or lightheaded, falls  fever, flu-like symptoms  high or low blood pressure  pain, tingling, numbness in the hands or feet  severe pain in the chest, back, flanks, or groin  swelling of the ankles, feet, hands  trouble passing urine or change in the amount of  urine  unusually weak or tired Side effects that usually do not require medical attention (report to your doctor or health care professional if they continue or are bothersome):  cramps  dark colored stools  diarrhea  headache  nausea, vomiting  stomach upset This list may not describe all possible side effects. Call your doctor for medical advice about side effects. You may report side effects to FDA at 1-800-FDA-1088. Where should I keep my medicine? This drug is given in a hospital or clinic and will not be stored at home. NOTE: This sheet is a summary. It may not cover all possible information. If you have questions about this medicine, talk to your doctor, pharmacist, or health care provider.  2020 Elsevier/Gold Standard (2007-12-02 15:58:57)  [facility] Discharge Instructions for Patients Receiving Chemotherapy  Today you received the following chemotherapy agents: Bevacizumab   To help prevent nausea and vomiting after your treatment, we encourage you to take your nausea medication as directed.    If you develop nausea and vomiting that is not controlled by your nausea medication, call the clinic.   BELOW ARE SYMPTOMS THAT SHOULD BE REPORTED IMMEDIATELY:  *FEVER GREATER THAN 100.5 F  *CHILLS WITH OR WITHOUT FEVER  NAUSEA AND VOMITING THAT IS NOT CONTROLLED WITH YOUR NAUSEA MEDICATION  *UNUSUAL SHORTNESS OF BREATH  *UNUSUAL BRUISING OR BLEEDING  TENDERNESS IN MOUTH AND THROAT WITH OR WITHOUT PRESENCE OF ULCERS  *URINARY PROBLEMS  *BOWEL PROBLEMS  UNUSUAL RASH Items with * indicate a potential emergency and should be followed up as soon as possible.  Feel free to call the clinic should you have any questions or concerns. The clinic phone number is [phone].  Please show the [place] at check-in to the Emergency Department and triage nurse.

## 2019-07-30 ENCOUNTER — Telehealth: Payer: Self-pay | Admitting: Hematology

## 2019-07-30 DIAGNOSIS — M1990 Unspecified osteoarthritis, unspecified site: Secondary | ICD-10-CM | POA: Diagnosis not present

## 2019-07-30 NOTE — Telephone Encounter (Signed)
Scheduled appt per 4/15 los.  Spoke with pt and she is aware of her appt date and time.

## 2019-08-02 DIAGNOSIS — M1990 Unspecified osteoarthritis, unspecified site: Secondary | ICD-10-CM | POA: Diagnosis not present

## 2019-08-03 DIAGNOSIS — M1990 Unspecified osteoarthritis, unspecified site: Secondary | ICD-10-CM | POA: Diagnosis not present

## 2019-08-04 DIAGNOSIS — M1990 Unspecified osteoarthritis, unspecified site: Secondary | ICD-10-CM | POA: Diagnosis not present

## 2019-08-05 ENCOUNTER — Inpatient Hospital Stay: Payer: Medicaid Other

## 2019-08-05 ENCOUNTER — Other Ambulatory Visit: Payer: Self-pay

## 2019-08-05 VITALS — BP 159/75 | HR 63 | Temp 99.1°F | Resp 20

## 2019-08-05 DIAGNOSIS — Z5112 Encounter for antineoplastic immunotherapy: Secondary | ICD-10-CM | POA: Diagnosis not present

## 2019-08-05 DIAGNOSIS — D500 Iron deficiency anemia secondary to blood loss (chronic): Secondary | ICD-10-CM

## 2019-08-05 DIAGNOSIS — M1990 Unspecified osteoarthritis, unspecified site: Secondary | ICD-10-CM | POA: Diagnosis not present

## 2019-08-05 DIAGNOSIS — Z95828 Presence of other vascular implants and grafts: Secondary | ICD-10-CM

## 2019-08-05 LAB — CBC WITH DIFFERENTIAL (CANCER CENTER ONLY)
Abs Immature Granulocytes: 0.05 10*3/uL (ref 0.00–0.07)
Basophils Absolute: 0 10*3/uL (ref 0.0–0.1)
Basophils Relative: 0 %
Eosinophils Absolute: 0.1 10*3/uL (ref 0.0–0.5)
Eosinophils Relative: 1 %
HCT: 32.1 % — ABNORMAL LOW (ref 36.0–46.0)
Hemoglobin: 9.4 g/dL — ABNORMAL LOW (ref 12.0–15.0)
Immature Granulocytes: 0 %
Lymphocytes Relative: 13 %
Lymphs Abs: 1.6 10*3/uL (ref 0.7–4.0)
MCH: 23.7 pg — ABNORMAL LOW (ref 26.0–34.0)
MCHC: 29.3 g/dL — ABNORMAL LOW (ref 30.0–36.0)
MCV: 80.9 fL (ref 80.0–100.0)
Monocytes Absolute: 0.5 10*3/uL (ref 0.1–1.0)
Monocytes Relative: 4 %
Neutro Abs: 10.1 10*3/uL — ABNORMAL HIGH (ref 1.7–7.7)
Neutrophils Relative %: 82 %
Platelet Count: 235 10*3/uL (ref 150–400)
RBC: 3.97 MIL/uL (ref 3.87–5.11)
RDW: 22.4 % — ABNORMAL HIGH (ref 11.5–15.5)
WBC Count: 12.4 10*3/uL — ABNORMAL HIGH (ref 4.0–10.5)
nRBC: 0 % (ref 0.0–0.2)

## 2019-08-05 LAB — CMP (CANCER CENTER ONLY)
ALT: 11 U/L (ref 0–44)
AST: 17 U/L (ref 15–41)
Albumin: 3.7 g/dL (ref 3.5–5.0)
Alkaline Phosphatase: 82 U/L (ref 38–126)
Anion gap: 11 (ref 5–15)
BUN: 7 mg/dL (ref 6–20)
CO2: 25 mmol/L (ref 22–32)
Calcium: 9.2 mg/dL (ref 8.9–10.3)
Chloride: 106 mmol/L (ref 98–111)
Creatinine: 0.73 mg/dL (ref 0.44–1.00)
GFR, Est AFR Am: >60 mL/min (ref >60)
GFR, Estimated: >60 mL/min (ref >60)
Glucose, Bld: 106 mg/dL — ABNORMAL HIGH (ref 70–99)
Potassium: 3.8 mmol/L (ref 3.5–5.1)
Sodium: 142 mmol/L (ref 135–145)
Total Bilirubin: 0.3 mg/dL (ref 0.3–1.2)
Total Protein: 8.1 g/dL (ref 6.5–8.1)

## 2019-08-05 MED ORDER — HEPARIN SOD (PORK) LOCK FLUSH 100 UNIT/ML IV SOLN
500.0000 [IU] | Freq: Once | INTRAVENOUS | Status: AC
  Administered 2019-08-05: 500 [IU]
  Filled 2019-08-05: qty 5

## 2019-08-05 MED ORDER — SODIUM CHLORIDE 0.9 % IV SOLN
125.0000 mg | Freq: Once | INTRAVENOUS | Status: AC
  Administered 2019-08-05: 125 mg via INTRAVENOUS
  Filled 2019-08-05: qty 10

## 2019-08-05 MED ORDER — SODIUM CHLORIDE 0.9 % IV SOLN
Freq: Once | INTRAVENOUS | Status: AC
  Filled 2019-08-05: qty 250

## 2019-08-05 MED ORDER — SODIUM CHLORIDE 0.9% FLUSH
10.0000 mL | Freq: Once | INTRAVENOUS | Status: AC
  Administered 2019-08-05: 10 mL
  Filled 2019-08-05: qty 10

## 2019-08-05 NOTE — Patient Instructions (Signed)
Sodium Ferric Gluconate Complex injection What is this medicine? SODIUM FERRIC GLUCONATE COMPLEX (SOE dee um FER ik GLOO koe nate KOM pleks) is an iron replacement. It is used with epoetin therapy to treat low iron levels in patients who are receiving hemodialysis. This medicine may be used for other purposes; ask your health care provider or pharmacist if you have questions. COMMON BRAND NAME(S): Ferrlecit, Nulecit What should I tell my health care provider before I take this medicine? They need to know if you have any of the following conditions:  anemia that is not from iron deficiency  high levels of iron in the body  an unusual or allergic reaction to iron, benzyl alcohol, other medicines, foods, dyes, or preservatives  pregnant or are trying to become pregnant  breast-feeding How should I use this medicine? This medicine is for infusion into a vein. It is given by a health care professional in a hospital or clinic setting. Talk to your pediatrician regarding the use of this medicine in children. While this drug may be prescribed for children as young as 6 years old for selected conditions, precautions do apply. Overdosage: If you think you have taken too much of this medicine contact a poison control center or emergency room at once. NOTE: This medicine is only for you. Do not share this medicine with others. What if I miss a dose? It is important not to miss your dose. Call your doctor or health care professional if you are unable to keep an appointment. What may interact with this medicine? Do not take this medicine with any of the following medications:  deferoxamine  dimercaprol  other iron products This medicine may also interact with the following medications:  chloramphenicol  deferasirox  medicine for blood pressure like enalapril This list may not describe all possible interactions. Give your health care provider a list of all the medicines, herbs,  non-prescription drugs, or dietary supplements you use. Also tell them if you smoke, drink alcohol, or use illegal drugs. Some items may interact with your medicine. What should I watch for while using this medicine? Your condition will be monitored carefully while you are receiving this medicine. Visit your doctor for check-ups as directed. What side effects may I notice from receiving this medicine? Side effects that you should report to your doctor or health care professional as soon as possible:  allergic reactions like skin rash, itching or hives, swelling of the face, lips, or tongue  breathing problems  changes in hearing  changes in vision  chills, flushing, or sweating  fast, irregular heartbeat  feeling faint or lightheaded, falls  fever, flu-like symptoms  high or low blood pressure  pain, tingling, numbness in the hands or feet  severe pain in the chest, back, flanks, or groin  swelling of the ankles, feet, hands  trouble passing urine or change in the amount of urine  unusually weak or tired Side effects that usually do not require medical attention (report to your doctor or health care professional if they continue or are bothersome):  cramps  dark colored stools  diarrhea  headache  nausea, vomiting  stomach upset This list may not describe all possible side effects. Call your doctor for medical advice about side effects. You may report side effects to FDA at 1-800-FDA-1088. Where should I keep my medicine? This drug is given in a hospital or clinic and will not be stored at home. NOTE: This sheet is a summary. It may not cover all   possible information. If you have questions about this medicine, talk to your doctor, pharmacist, or health care provider.  2020 Elsevier/Gold Standard (2007-12-02 15:58:57)  [facility] Discharge Instructions for Patients Receiving Chemotherapy  Today you received the following chemotherapy agents:  Bevacizumab   To help prevent nausea and vomiting after your treatment, we encourage you to take your nausea medication as directed.    If you develop nausea and vomiting that is not controlled by your nausea medication, call the clinic.   BELOW ARE SYMPTOMS THAT SHOULD BE REPORTED IMMEDIATELY:  *FEVER GREATER THAN 100.5 F  *CHILLS WITH OR WITHOUT FEVER  NAUSEA AND VOMITING THAT IS NOT CONTROLLED WITH YOUR NAUSEA MEDICATION  *UNUSUAL SHORTNESS OF BREATH  *UNUSUAL BRUISING OR BLEEDING  TENDERNESS IN MOUTH AND THROAT WITH OR WITHOUT PRESENCE OF ULCERS  *URINARY PROBLEMS  *BOWEL PROBLEMS  UNUSUAL RASH Items with * indicate a potential emergency and should be followed up as soon as possible.  Feel free to call the clinic should you have any questions or concerns. The clinic phone number is [phone].  Please show the [place] at check-in to the Emergency Department and triage nurse.

## 2019-08-06 DIAGNOSIS — M1990 Unspecified osteoarthritis, unspecified site: Secondary | ICD-10-CM | POA: Diagnosis not present

## 2019-08-06 NOTE — Progress Notes (Signed)
Pharmacist Chemotherapy Monitoring - Follow Up Assessment    I verify that I have reviewed each item in the below checklist:  . Regimen for the patient is scheduled for the appropriate day and plan matches scheduled date. [name] Appropriate non-routine labs are ordered dependent on drug ordered. . If applicable, additional medications reviewed and ordered per protocol based on lifetime cumulative doses and/or treatment regimen.   Plan for follow-up and/or issues identified: No . I-vent associated with next due treatment: No . MD and/or nursing notified: No  Riese Hellard D 08/06/2019 2:49 PM

## 2019-08-09 DIAGNOSIS — M1990 Unspecified osteoarthritis, unspecified site: Secondary | ICD-10-CM | POA: Diagnosis not present

## 2019-08-10 DIAGNOSIS — M1990 Unspecified osteoarthritis, unspecified site: Secondary | ICD-10-CM | POA: Diagnosis not present

## 2019-08-11 DIAGNOSIS — M1990 Unspecified osteoarthritis, unspecified site: Secondary | ICD-10-CM | POA: Diagnosis not present

## 2019-08-12 ENCOUNTER — Inpatient Hospital Stay: Payer: Medicaid Other

## 2019-08-12 ENCOUNTER — Other Ambulatory Visit: Payer: Self-pay

## 2019-08-12 VITALS — BP 151/80 | HR 70 | Temp 97.8°F | Resp 20

## 2019-08-12 DIAGNOSIS — Z95828 Presence of other vascular implants and grafts: Secondary | ICD-10-CM

## 2019-08-12 DIAGNOSIS — D500 Iron deficiency anemia secondary to blood loss (chronic): Secondary | ICD-10-CM

## 2019-08-12 DIAGNOSIS — I780 Hereditary hemorrhagic telangiectasia: Secondary | ICD-10-CM

## 2019-08-12 DIAGNOSIS — Z5112 Encounter for antineoplastic immunotherapy: Secondary | ICD-10-CM | POA: Diagnosis not present

## 2019-08-12 DIAGNOSIS — M1990 Unspecified osteoarthritis, unspecified site: Secondary | ICD-10-CM | POA: Diagnosis not present

## 2019-08-12 LAB — CBC WITH DIFFERENTIAL (CANCER CENTER ONLY)
Abs Immature Granulocytes: 0.05 10*3/uL (ref 0.00–0.07)
Basophils Absolute: 0 10*3/uL (ref 0.0–0.1)
Basophils Relative: 0 %
Eosinophils Absolute: 0.1 10*3/uL (ref 0.0–0.5)
Eosinophils Relative: 1 %
HCT: 30.8 % — ABNORMAL LOW (ref 36.0–46.0)
Hemoglobin: 8.9 g/dL — ABNORMAL LOW (ref 12.0–15.0)
Immature Granulocytes: 0 %
Lymphocytes Relative: 14 %
Lymphs Abs: 1.8 10*3/uL (ref 0.7–4.0)
MCH: 23.9 pg — ABNORMAL LOW (ref 26.0–34.0)
MCHC: 28.9 g/dL — ABNORMAL LOW (ref 30.0–36.0)
MCV: 82.6 fL (ref 80.0–100.0)
Monocytes Absolute: 0.6 10*3/uL (ref 0.1–1.0)
Monocytes Relative: 4 %
Neutro Abs: 10 10*3/uL — ABNORMAL HIGH (ref 1.7–7.7)
Neutrophils Relative %: 81 %
Platelet Count: 275 10*3/uL (ref 150–400)
RBC: 3.73 MIL/uL — ABNORMAL LOW (ref 3.87–5.11)
RDW: 22.9 % — ABNORMAL HIGH (ref 11.5–15.5)
WBC Count: 12.5 10*3/uL — ABNORMAL HIGH (ref 4.0–10.5)
nRBC: 0 % (ref 0.0–0.2)

## 2019-08-12 LAB — CMP (CANCER CENTER ONLY)
ALT: 8 U/L (ref 0–44)
AST: 13 U/L — ABNORMAL LOW (ref 15–41)
Albumin: 3.5 g/dL (ref 3.5–5.0)
Alkaline Phosphatase: 80 U/L (ref 38–126)
Anion gap: 8 (ref 5–15)
BUN: 12 mg/dL (ref 6–20)
CO2: 27 mmol/L (ref 22–32)
Calcium: 9.5 mg/dL (ref 8.9–10.3)
Chloride: 108 mmol/L (ref 98–111)
Creatinine: 0.8 mg/dL (ref 0.44–1.00)
GFR, Est AFR Am: >60 mL/min (ref >60)
GFR, Estimated: >60 mL/min (ref >60)
Glucose, Bld: 133 mg/dL — ABNORMAL HIGH (ref 70–99)
Potassium: 3.9 mmol/L (ref 3.5–5.1)
Sodium: 143 mmol/L (ref 135–145)
Total Bilirubin: 0.2 mg/dL — ABNORMAL LOW (ref 0.3–1.2)
Total Protein: 7.7 g/dL (ref 6.5–8.1)

## 2019-08-12 LAB — TOTAL PROTEIN, URINE DIPSTICK: Protein, ur: 100 mg/dL — AB

## 2019-08-12 LAB — IRON AND TIBC
Iron: 33 ug/dL — ABNORMAL LOW (ref 41–142)
Saturation Ratios: 12 % — ABNORMAL LOW (ref 21–57)
TIBC: 285 ug/dL (ref 236–444)
UIBC: 252 ug/dL (ref 120–384)

## 2019-08-12 MED ORDER — SODIUM CHLORIDE 0.9 % IV SOLN
5.0000 mg/kg | Freq: Once | INTRAVENOUS | Status: AC
  Administered 2019-08-12: 600 mg via INTRAVENOUS
  Filled 2019-08-12: qty 16

## 2019-08-12 MED ORDER — SODIUM CHLORIDE 0.9 % IV SOLN
INTRAVENOUS | Status: DC
  Filled 2019-08-12: qty 250

## 2019-08-12 MED ORDER — SODIUM CHLORIDE 0.9% FLUSH
10.0000 mL | INTRAVENOUS | Status: DC | PRN
  Administered 2019-08-12: 10 mL
  Filled 2019-08-12: qty 10

## 2019-08-12 MED ORDER — HEPARIN SOD (PORK) LOCK FLUSH 100 UNIT/ML IV SOLN
500.0000 [IU] | Freq: Once | INTRAVENOUS | Status: AC | PRN
  Administered 2019-08-12: 500 [IU]
  Filled 2019-08-12: qty 5

## 2019-08-12 MED ORDER — SODIUM CHLORIDE 0.9 % IV SOLN
125.0000 mg | Freq: Once | INTRAVENOUS | Status: AC
  Administered 2019-08-12: 125 mg via INTRAVENOUS
  Filled 2019-08-12: qty 10

## 2019-08-12 NOTE — Progress Notes (Signed)
Dr. Burr Medico gave ok to treat w/ Urine Prot = 100 mg/dL = 2+  Kennith Center, Pharm.D., CPP 08/12/2019@4 :19 PM

## 2019-08-12 NOTE — Patient Instructions (Signed)
[facility] Discharge Instructions for Patients Receiving Chemotherapy  Today you received the following chemotherapy agents: Bevacizumab  To help prevent nausea and vomiting after your treatment, we encourage you to take your nausea medication as directed.   If you develop nausea and vomiting that is not controlled by your nausea medication, call the clinic.   BELOW ARE SYMPTOMS THAT SHOULD BE REPORTED IMMEDIATELY:  *FEVER GREATER THAN 100.5 F  *CHILLS WITH OR WITHOUT FEVER  NAUSEA AND VOMITING THAT IS NOT CONTROLLED WITH YOUR NAUSEA MEDICATION  *UNUSUAL SHORTNESS OF BREATH  *UNUSUAL BRUISING OR BLEEDING  TENDERNESS IN MOUTH AND THROAT WITH OR WITHOUT PRESENCE OF ULCERS  *URINARY PROBLEMS  *BOWEL PROBLEMS  UNUSUAL RASH Items with * indicate a potential emergency and should be followed up as soon as possible.  Feel free to call the clinic should you have any questions or concerns. The clinic phone number is [phone].  Please show the [facility] at check-in to the Emergency Department and triage nurse.  Sodium Ferric Gluconate Complex injection What is this medicine? SODIUM FERRIC GLUCONATE COMPLEX (SOE dee um FER ik GLOO koe nate KOM pleks) is an iron replacement. It is used with epoetin therapy to treat low iron levels in patients who are receiving hemodialysis. This medicine may be used for other purposes; ask your health care provider or pharmacist if you have questions. COMMON BRAND NAME(S): Ferrlecit, Nulecit What should I tell my health care provider before I take this medicine? They need to know if you have any of the following conditions:  anemia that is not from iron deficiency  high levels of iron in the body  an unusual or allergic reaction to iron, benzyl alcohol, other medicines, foods, dyes, or preservatives  pregnant or are trying to become pregnant  breast-feeding How should I use this medicine? This medicine is for  infusion into a vein. It is given by a health care professional in a hospital or clinic setting. Talk to your pediatrician regarding the use of this medicine in children. While this drug may be prescribed for children as young as 57 years old for selected conditions, precautions do apply. Overdosage: If you think you have taken too much of this medicine contact a poison control center or emergency room at once. NOTE: This medicine is only for you. Do not share this medicine with others. What if I miss a dose? It is important not to miss your dose. Call your doctor or health care professional if you are unable to keep an appointment. What may interact with this medicine? Do not take this medicine with any of the following medications:  deferoxamine  dimercaprol  other iron products This medicine may also interact with the following medications:  chloramphenicol  deferasirox  medicine for blood pressure like enalapril This list may not describe all possible interactions. Give your health care provider a list of all the medicines, herbs, non-prescription drugs, or dietary supplements you use. Also tell them if you smoke, drink alcohol, or use illegal drugs. Some items may interact with your medicine. What should I watch for while using this medicine? Your condition will be monitored carefully while you are receiving this medicine. Visit your doctor for check-ups as directed. What side effects may I notice from receiving this medicine? Side effects that you should report to your doctor or health care professional as soon as possible:  allergic reactions like skin rash, itching or hives, swelling of the face, lips, or tongue  breathing problems  changes in hearing  changes in vision  chills, flushing, or sweating  fast, irregular heartbeat  feeling faint or lightheaded, falls  fever, flu-like symptoms  high or low blood pressure  pain, tingling, numbness in the hands or  feet  severe pain in the chest, back, flanks, or groin  swelling of the ankles, feet, hands  trouble passing urine or change in the amount of urine  unusually weak or tired Side effects that usually do not require medical attention (report to your doctor or health care professional if they continue or are bothersome):  cramps  dark colored stools  diarrhea  headache  nausea, vomiting  stomach upset This list may not describe all possible side effects. Call your doctor for medical advice about side effects. You may report side effects to FDA at 1-800-FDA-1088. Where should I keep my medicine? This drug is given in a hospital or clinic and will not be stored at home. NOTE: This sheet is a summary. It may not cover all possible information. If you have questions about this medicine, talk to your doctor, pharmacist, or health care provider.  2020 Elsevier/Gold Standard (2007-12-02 15:58:57) Sodium Ferric Gluconate Complex injection What is this medicine? SODIUM FERRIC GLUCONATE COMPLEX (SOE dee um FER ik GLOO koe nate KOM pleks) is an iron replacement. It is used with epoetin therapy to treat low iron levels in patients who are receiving hemodialysis. This medicine may be used for other purposes; ask your health care provider or pharmacist if you have questions. COMMON BRAND NAME(S): Ferrlecit, Nulecit What should I tell my health care provider before I take this medicine? They need to know if you have any of the following conditions:  anemia that is not from iron deficiency  high levels of iron in the body  an unusual or allergic reaction to iron, benzyl alcohol, other medicines, foods, dyes, or preservatives  pregnant or are trying to become pregnant  breast-feeding How should I use this medicine? This medicine is for infusion into a vein. It is given by a health care professional in a hospital or clinic setting. Talk to your pediatrician regarding the use of this  medicine in children. While this drug may be prescribed for children as young as 14 years old for selected conditions, precautions do apply. Overdosage: If you think you have taken too much of this medicine contact a poison control center or emergency room at once. NOTE: This medicine is only for you. Do not share this medicine with others. What if I miss a dose? It is important not to miss your dose. Call your doctor or health care professional if you are unable to keep an appointment. What may interact with this medicine? Do not take this medicine with any of the following medications:  deferoxamine  dimercaprol  other iron products This medicine may also interact with the following medications:  chloramphenicol  deferasirox  medicine for blood pressure like enalapril This list may not describe all possible interactions. Give your health care provider a list of all the medicines, herbs, non-prescription drugs, or dietary supplements you use. Also tell them if you smoke, drink alcohol, or use illegal drugs. Some items may interact with your medicine. What should I watch for while using this medicine? Your condition will be monitored carefully while you are receiving this medicine. Visit your doctor for check-ups as directed. What side effects may I notice from receiving this medicine? Side effects that you should report to your doctor  or health care professional as soon as possible:  allergic reactions like skin rash, itching or hives, swelling of the face, lips, or tongue  breathing problems  changes in hearing  changes in vision  chills, flushing, or sweating  fast, irregular heartbeat  feeling faint or lightheaded, falls  fever, flu-like symptoms  high or low blood pressure  pain, tingling, numbness in the hands or feet  severe pain in the chest, back, flanks, or groin  swelling of the ankles, feet, hands  trouble passing urine or change in the amount of  urine  unusually weak or tired Side effects that usually do not require medical attention (report to your doctor or health care professional if they continue or are bothersome):  cramps  dark colored stools  diarrhea  headache  nausea, vomiting  stomach upset This list may not describe all possible side effects. Call your doctor for medical advice about side effects. You may report side effects to FDA at 1-800-FDA-1088. Where should I keep my medicine? This drug is given in a hospital or clinic and will not be stored at home. NOTE: This sheet is a summary. It may not cover all possible information. If you have questions about this medicine, talk to your doctor, pharmacist, or health care provider.  2020 Elsevier/Gold Standard (2007-12-02 15:58:57)  [facility] Discharge Instructions for Patients Receiving Chemotherapy  Today you received the following chemotherapy agents: Bevacizumab   To help prevent nausea and vomiting after your treatment, we encourage you to take your nausea medication as directed.    If you develop nausea and vomiting that is not controlled by your nausea medication, call the clinic.   BELOW ARE SYMPTOMS THAT SHOULD BE REPORTED IMMEDIATELY:  *FEVER GREATER THAN 100.5 F  *CHILLS WITH OR WITHOUT FEVER  NAUSEA AND VOMITING THAT IS NOT CONTROLLED WITH YOUR NAUSEA MEDICATION  *UNUSUAL SHORTNESS OF BREATH  *UNUSUAL BRUISING OR BLEEDING  TENDERNESS IN MOUTH AND THROAT WITH OR WITHOUT PRESENCE OF ULCERS  *URINARY PROBLEMS  *BOWEL PROBLEMS  UNUSUAL RASH Items with * indicate a potential emergency and should be followed up as soon as possible.  Feel free to call the clinic should you have any questions or concerns. The clinic phone number is [phone].  Please show the [place] at check-in to the Emergency Department and triage nurse.

## 2019-08-13 DIAGNOSIS — M1990 Unspecified osteoarthritis, unspecified site: Secondary | ICD-10-CM | POA: Diagnosis not present

## 2019-08-16 ENCOUNTER — Other Ambulatory Visit: Payer: Self-pay | Admitting: Hematology

## 2019-08-16 ENCOUNTER — Other Ambulatory Visit: Payer: Self-pay | Admitting: Internal Medicine

## 2019-08-16 DIAGNOSIS — M1990 Unspecified osteoarthritis, unspecified site: Secondary | ICD-10-CM | POA: Diagnosis not present

## 2019-08-16 DIAGNOSIS — F5101 Primary insomnia: Secondary | ICD-10-CM

## 2019-08-16 DIAGNOSIS — R040 Epistaxis: Secondary | ICD-10-CM

## 2019-08-17 DIAGNOSIS — M1990 Unspecified osteoarthritis, unspecified site: Secondary | ICD-10-CM | POA: Diagnosis not present

## 2019-08-18 DIAGNOSIS — M1990 Unspecified osteoarthritis, unspecified site: Secondary | ICD-10-CM | POA: Diagnosis not present

## 2019-08-19 ENCOUNTER — Inpatient Hospital Stay: Payer: Medicaid Other

## 2019-08-19 ENCOUNTER — Inpatient Hospital Stay: Payer: Medicaid Other | Attending: Hematology

## 2019-08-19 DIAGNOSIS — Z5112 Encounter for antineoplastic immunotherapy: Secondary | ICD-10-CM | POA: Insufficient documentation

## 2019-08-19 DIAGNOSIS — M1990 Unspecified osteoarthritis, unspecified site: Secondary | ICD-10-CM | POA: Diagnosis not present

## 2019-08-19 DIAGNOSIS — Z79899 Other long term (current) drug therapy: Secondary | ICD-10-CM | POA: Insufficient documentation

## 2019-08-19 DIAGNOSIS — I780 Hereditary hemorrhagic telangiectasia: Secondary | ICD-10-CM | POA: Insufficient documentation

## 2019-08-19 DIAGNOSIS — D500 Iron deficiency anemia secondary to blood loss (chronic): Secondary | ICD-10-CM | POA: Insufficient documentation

## 2019-08-20 DIAGNOSIS — M1990 Unspecified osteoarthritis, unspecified site: Secondary | ICD-10-CM | POA: Diagnosis not present

## 2019-08-20 NOTE — Progress Notes (Signed)
Pharmacist Chemotherapy Monitoring - Follow Up Assessment    I verify that I have reviewed each item in the below checklist:  . Regimen for the patient is scheduled for the appropriate day and plan matches scheduled date. [name] Appropriate non-routine labs are ordered dependent on drug ordered. . If applicable, additional medications reviewed and ordered per protocol based on lifetime cumulative doses and/or treatment regimen.   Plan for follow-up and/or issues identified: No . I-vent associated with next due treatment: No . MD and/or nursing notified: No  Britt Boozer 08/20/2019 12:13 PM

## 2019-08-23 DIAGNOSIS — M1990 Unspecified osteoarthritis, unspecified site: Secondary | ICD-10-CM | POA: Diagnosis not present

## 2019-08-24 DIAGNOSIS — M1990 Unspecified osteoarthritis, unspecified site: Secondary | ICD-10-CM | POA: Diagnosis not present

## 2019-08-25 DIAGNOSIS — M1990 Unspecified osteoarthritis, unspecified site: Secondary | ICD-10-CM | POA: Diagnosis not present

## 2019-08-26 ENCOUNTER — Inpatient Hospital Stay: Payer: Medicaid Other

## 2019-08-26 ENCOUNTER — Other Ambulatory Visit: Payer: Self-pay

## 2019-08-26 VITALS — BP 161/80 | HR 74 | Temp 98.7°F | Resp 16

## 2019-08-26 DIAGNOSIS — D500 Iron deficiency anemia secondary to blood loss (chronic): Secondary | ICD-10-CM

## 2019-08-26 DIAGNOSIS — M1990 Unspecified osteoarthritis, unspecified site: Secondary | ICD-10-CM | POA: Diagnosis not present

## 2019-08-26 DIAGNOSIS — Z5112 Encounter for antineoplastic immunotherapy: Secondary | ICD-10-CM | POA: Diagnosis not present

## 2019-08-26 DIAGNOSIS — I780 Hereditary hemorrhagic telangiectasia: Secondary | ICD-10-CM

## 2019-08-26 DIAGNOSIS — Z79899 Other long term (current) drug therapy: Secondary | ICD-10-CM | POA: Diagnosis not present

## 2019-08-26 DIAGNOSIS — Z95828 Presence of other vascular implants and grafts: Secondary | ICD-10-CM

## 2019-08-26 LAB — CBC WITH DIFFERENTIAL (CANCER CENTER ONLY)
Abs Immature Granulocytes: 0.05 10*3/uL (ref 0.00–0.07)
Basophils Absolute: 0.1 10*3/uL (ref 0.0–0.1)
Basophils Relative: 0 %
Eosinophils Absolute: 0.1 10*3/uL (ref 0.0–0.5)
Eosinophils Relative: 1 %
HCT: 32.3 % — ABNORMAL LOW (ref 36.0–46.0)
Hemoglobin: 9.4 g/dL — ABNORMAL LOW (ref 12.0–15.0)
Immature Granulocytes: 0 %
Lymphocytes Relative: 12 %
Lymphs Abs: 1.8 10*3/uL (ref 0.7–4.0)
MCH: 23.7 pg — ABNORMAL LOW (ref 26.0–34.0)
MCHC: 29.1 g/dL — ABNORMAL LOW (ref 30.0–36.0)
MCV: 81.6 fL (ref 80.0–100.0)
Monocytes Absolute: 0.7 10*3/uL (ref 0.1–1.0)
Monocytes Relative: 5 %
Neutro Abs: 12.2 10*3/uL — ABNORMAL HIGH (ref 1.7–7.7)
Neutrophils Relative %: 82 %
Platelet Count: 244 10*3/uL (ref 150–400)
RBC: 3.96 MIL/uL (ref 3.87–5.11)
RDW: 22.5 % — ABNORMAL HIGH (ref 11.5–15.5)
WBC Count: 14.9 10*3/uL — ABNORMAL HIGH (ref 4.0–10.5)
nRBC: 0 % (ref 0.0–0.2)

## 2019-08-26 LAB — CMP (CANCER CENTER ONLY)
ALT: 9 U/L (ref 0–44)
AST: 16 U/L (ref 15–41)
Albumin: 3.6 g/dL (ref 3.5–5.0)
Alkaline Phosphatase: 85 U/L (ref 38–126)
Anion gap: 10 (ref 5–15)
BUN: 8 mg/dL (ref 6–20)
CO2: 26 mmol/L (ref 22–32)
Calcium: 9.6 mg/dL (ref 8.9–10.3)
Chloride: 106 mmol/L (ref 98–111)
Creatinine: 0.83 mg/dL (ref 0.44–1.00)
GFR, Est AFR Am: >60 mL/min (ref >60)
GFR, Estimated: >60 mL/min (ref >60)
Glucose, Bld: 98 mg/dL (ref 70–99)
Potassium: 3.5 mmol/L (ref 3.5–5.1)
Sodium: 142 mmol/L (ref 135–145)
Total Bilirubin: 0.3 mg/dL (ref 0.3–1.2)
Total Protein: 8.1 g/dL (ref 6.5–8.1)

## 2019-08-26 MED ORDER — SODIUM CHLORIDE 0.9 % IV SOLN
Freq: Once | INTRAVENOUS | Status: AC
  Filled 2019-08-26: qty 250

## 2019-08-26 MED ORDER — SODIUM CHLORIDE 0.9% FLUSH
10.0000 mL | INTRAVENOUS | Status: DC | PRN
  Administered 2019-08-26: 10 mL
  Filled 2019-08-26: qty 10

## 2019-08-26 MED ORDER — HEPARIN SOD (PORK) LOCK FLUSH 100 UNIT/ML IV SOLN
500.0000 [IU] | Freq: Once | INTRAVENOUS | Status: AC | PRN
  Administered 2019-08-26: 500 [IU]
  Filled 2019-08-26: qty 5

## 2019-08-26 MED ORDER — SODIUM CHLORIDE 0.9 % IV SOLN
5.0000 mg/kg | Freq: Once | INTRAVENOUS | Status: AC
  Administered 2019-08-26: 600 mg via INTRAVENOUS
  Filled 2019-08-26: qty 16

## 2019-08-26 MED ORDER — SODIUM CHLORIDE 0.9 % IV SOLN
125.0000 mg | Freq: Once | INTRAVENOUS | Status: AC
  Administered 2019-08-26: 125 mg via INTRAVENOUS
  Filled 2019-08-26: qty 10

## 2019-08-26 NOTE — Patient Instructions (Signed)
[facility] Discharge Instructions for Patients Receiving Chemotherapy  Today you received the following chemotherapy agents Bevacizumab  To help prevent nausea and vomiting after your treatment, we encourage you to take your nausea medication as directed.    If you develop nausea and vomiting that is not controlled by your nausea medication, call the clinic.   BELOW ARE SYMPTOMS THAT SHOULD BE REPORTED IMMEDIATELY:  *FEVER GREATER THAN 100.5 F  *CHILLS WITH OR WITHOUT FEVER  NAUSEA AND VOMITING THAT IS NOT CONTROLLED WITH YOUR NAUSEA MEDICATION  *UNUSUAL SHORTNESS OF BREATH  *UNUSUAL BRUISING OR BLEEDING  TENDERNESS IN MOUTH AND THROAT WITH OR WITHOUT PRESENCE OF ULCERS  *URINARY PROBLEMS  *BOWEL PROBLEMS  UNUSUAL RASH Items with * indicate a potential emergency and should be followed up as soon as possible.  Feel free to call the clinic should you have any questions or concerns. The clinic phone number is [phone].  Please show the [place] at check-in to the Emergency Department and triage nurse.  Sodium Ferric Gluconate Complex injection What is this medicine? SODIUM FERRIC GLUCONATE COMPLEX (SOE dee um FER ik GLOO koe nate KOM pleks) is an iron replacement. It is used with epoetin therapy to treat low iron levels in patients who are receiving hemodialysis. This medicine may be used for other purposes; ask your health care provider or pharmacist if you have questions. COMMON BRAND NAME(S): Ferrlecit, Nulecit What should I tell my health care provider before I take this medicine? They need to know if you have any of the following conditions:  anemia that is not from iron deficiency  high levels of iron in the body  an unusual or allergic reaction to iron, benzyl alcohol, other medicines, foods, dyes, or preservatives  pregnant or are trying to become pregnant  breast-feeding How should I use this medicine? This medicine is for  infusion into a vein. It is given by a health care professional in a hospital or clinic setting. Talk to your pediatrician regarding the use of this medicine in children. While this drug may be prescribed for children as Macy Lingenfelter as 59 years old for selected conditions, precautions do apply. Overdosage: If you think you have taken too much of this medicine contact a poison control center or emergency room at once. NOTE: This medicine is only for you. Do not share this medicine with others. What if I miss a dose? It is important not to miss your dose. Call your doctor or health care professional if you are unable to keep an appointment. What may interact with this medicine? Do not take this medicine with any of the following medications:  deferoxamine  dimercaprol  other iron products This medicine may also interact with the following medications:  chloramphenicol  deferasirox  medicine for blood pressure like enalapril This list may not describe all possible interactions. Give your health care provider a list of all the medicines, herbs, non-prescription drugs, or dietary supplements you use. Also tell them if you smoke, drink alcohol, or use illegal drugs. Some items may interact with your medicine. What should I watch for while using this medicine? Your condition will be monitored carefully while you are receiving this medicine. Visit your doctor for check-ups as directed. What side effects may I notice from receiving this medicine? Side effects that you should report to your doctor or health care professional as soon as possible:  allergic reactions like skin rash, itching or hives, swelling of the face, lips, or  tongue  breathing problems  changes in hearing  changes in vision  chills, flushing, or sweating  fast, irregular heartbeat  feeling faint or lightheaded, falls  fever, flu-like symptoms  high or low blood pressure  pain, tingling, numbness in the hands or  feet  severe pain in the chest, back, flanks, or groin  swelling of the ankles, feet, hands  trouble passing urine or change in the amount of urine  unusually weak or tired Side effects that usually do not require medical attention (report to your doctor or health care professional if they continue or are bothersome):  cramps  dark colored stools  diarrhea  headache  nausea, vomiting  stomach upset This list may not describe all possible side effects. Call your doctor for medical advice about side effects. You may report side effects to FDA at 1-800-FDA-1088. Where should I keep my medicine? This drug is given in a hospital or clinic and will not be stored at home. NOTE: This sheet is a summary. It may not cover all possible information. If you have questions about this medicine, talk to your doctor, pharmacist, or health care provider.  2020 Elsevier/Gold Standard (2007-12-02 15:58:57)

## 2019-08-26 NOTE — Progress Notes (Signed)
Ok to treat with urine protein of 100 from 4/29 per MD Burr Medico

## 2019-08-27 DIAGNOSIS — M1990 Unspecified osteoarthritis, unspecified site: Secondary | ICD-10-CM | POA: Diagnosis not present

## 2019-08-27 LAB — FERRITIN: Ferritin: 68 ng/mL (ref 11–307)

## 2019-08-30 DIAGNOSIS — M1990 Unspecified osteoarthritis, unspecified site: Secondary | ICD-10-CM | POA: Diagnosis not present

## 2019-08-31 ENCOUNTER — Other Ambulatory Visit: Payer: Self-pay | Admitting: Hematology

## 2019-08-31 DIAGNOSIS — M1990 Unspecified osteoarthritis, unspecified site: Secondary | ICD-10-CM | POA: Diagnosis not present

## 2019-09-01 DIAGNOSIS — M1990 Unspecified osteoarthritis, unspecified site: Secondary | ICD-10-CM | POA: Diagnosis not present

## 2019-09-02 ENCOUNTER — Inpatient Hospital Stay: Payer: Medicaid Other

## 2019-09-02 ENCOUNTER — Telehealth: Payer: Self-pay | Admitting: Internal Medicine

## 2019-09-02 DIAGNOSIS — M1990 Unspecified osteoarthritis, unspecified site: Secondary | ICD-10-CM | POA: Diagnosis not present

## 2019-09-02 NOTE — Telephone Encounter (Signed)
   BURKLEIGH HOR DOB: 1960-10-04 MRN: [id number]   RIDER WAIVER AND RELEASE OF LIABILITY  For purposes of improving physical access to our facilities, [facility] is pleased to partner with third parties to provide [place] patients or other authorized individuals the option of convenient, on-demand ground transportation services (the [employer]") through use of the technology service that enables users to request on-demand ground transportation from independent third-party providers.  By opting to use and accept these [employer], I, the undersigned, hereby agree on behalf of myself, and on behalf of any minor child using the [employer] for whom I am the parent or legal guardian, as follows:  1. Government social research officer provided to me are provided by independent third-party transportation providers who are not [employer] or employees and who are unaffiliated with [employer]. 2. [facility] is neither a transportation carrier nor a common or public carrier. 3. [facility] has no control over the quality or safety of the transportation that occurs as a result of the [employer]. 4. [facility] cannot guarantee that any third-party transportation provider will complete any arranged transportation service. 5. [facility] makes no representation, warranty, or guarantee regarding the reliability, timeliness, quality, safety, suitability, or availability of any of the Transport Services or that they will be error free. 6. I fully understand that traveling by vehicle involves risks and dangers of serious bodily injury, including permanent disability, paralysis, and death. I agree, on behalf of myself and on behalf of any minor child using the Transport Services for whom I am the parent or legal guardian, that the entire risk arising out of my use of the [employer] remains solely with me, to the maximum extent permitted under applicable law. 7. The [employer] are provided "as is" and "as available." [facility] disclaims all representations and warranties, express, implied or statutory, not expressly set out in these terms, including the implied warranties of merchantability and fitness for a particular purpose. 8. I hereby waive and release [facility], its agents, employees, officers, directors, representatives, insurers, attorneys, assigns, successors, subsidiaries, and affiliates from any and all past, present, or future claims, demands, liabilities, actions, causes of action, or suits of any kind directly or indirectly arising from acceptance and use of the [employer]. 9. I further waive and release [facility] and its affiliates from all present and future liability and responsibility for any injury or death to persons or damages to property caused by or related to the use of the [employer]. 10. I have read this Waiver and Release of Liability, and I understand the terms used in it and their legal significance. This Waiver is freely and voluntarily given with the understanding that my right (as well as the right of any minor child for whom I am the parent or legal guardian using the [employer]) to legal recourse against  in connection with the [employer] is knowingly surrendered in return for use of these services.   I attest that I read the consent document to [name], gave [name] the opportunity to ask questions and answered the questions asked (if any). I affirm that [name] then provided consent for she's participation in this program.     Drucie Ip

## 2019-09-03 DIAGNOSIS — M1990 Unspecified osteoarthritis, unspecified site: Secondary | ICD-10-CM | POA: Diagnosis not present

## 2019-09-06 DIAGNOSIS — M1990 Unspecified osteoarthritis, unspecified site: Secondary | ICD-10-CM | POA: Diagnosis not present

## 2019-09-07 DIAGNOSIS — M1990 Unspecified osteoarthritis, unspecified site: Secondary | ICD-10-CM | POA: Diagnosis not present

## 2019-09-07 IMAGING — DX DG CHEST 1V PORT
1 series · 1 of 1 positions shown · non-contrast
Comparison: 04/09/2017

CLINICAL DATA: Endotracheal and gastric tube placement.
Hematemesis.

EXAM:
PORTABLE CHEST 1 VIEW

[chest]
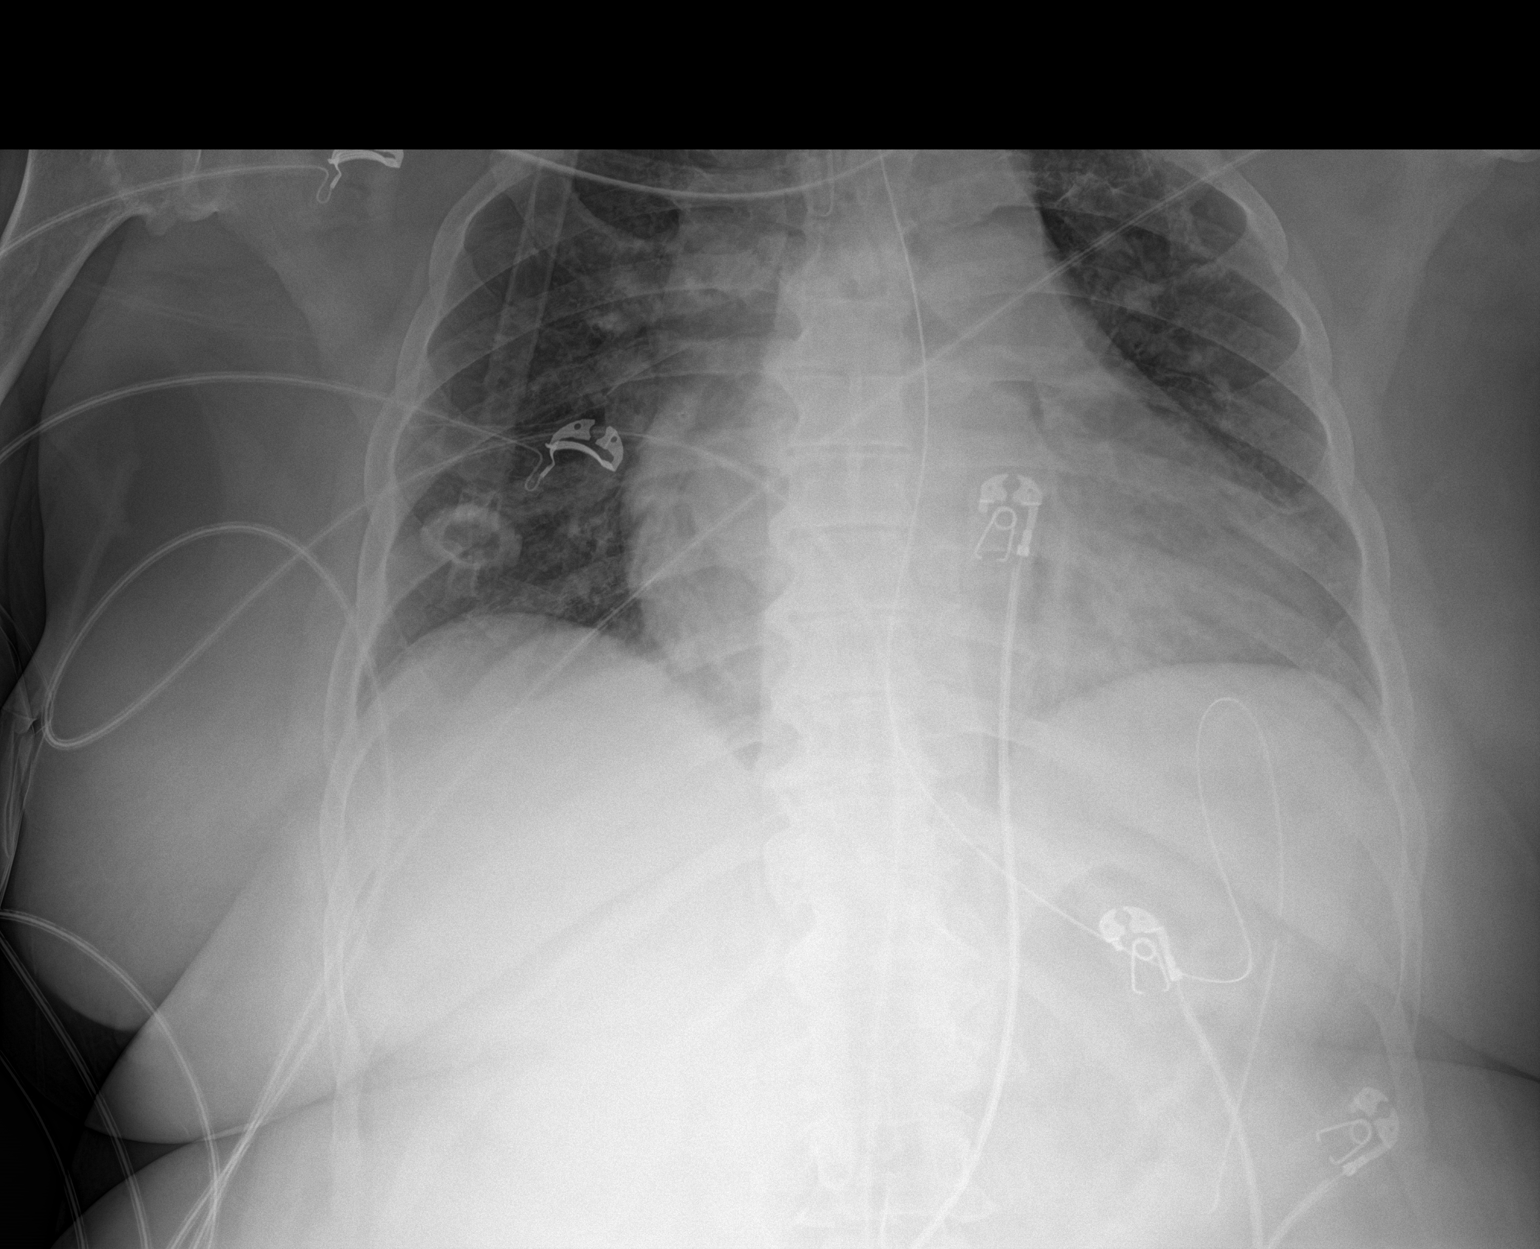

[1 of 1 positions shown; findings below may reference images not displayed]

FINDINGS: AP portable semi upright view. Endotracheal tube tip is 2 cm above
the carina. Gastric tube tip and side-port are coiled within the
proximal stomach. There is cardiomegaly with minimal atelectasis at
the lung bases. No alveolar consolidation or CHF. No effusion or
pneumothorax. No acute osseous abnormality.
IMPRESSION: 1. Cardiomegaly with bibasilar atelectasis. No active pulmonary
disease.
2. Tip of endotracheal tube approximately 2 cm above the carina.
Gastric tube extends into the stomach.

## 2019-09-08 ENCOUNTER — Telehealth: Payer: Self-pay | Admitting: *Deleted

## 2019-09-08 DIAGNOSIS — M1990 Unspecified osteoarthritis, unspecified site: Secondary | ICD-10-CM | POA: Diagnosis not present

## 2019-09-08 NOTE — Telephone Encounter (Signed)
Received fax request for incontinence supplies from [employer]. Patient is overdue for f/u with PCP. Will forward to [employer] to assist patient in making appt with PCP. May be done by telehealth unless PCP prefers in-person. Paperwork placed in PCP's box. Hubbard Hartshorn, BSN, RN-BC

## 2019-09-08 NOTE — Telephone Encounter (Signed)
Hello, Yes, telehealth would be fine. Thank you.

## 2019-09-09 ENCOUNTER — Inpatient Hospital Stay: Payer: Medicaid Other

## 2019-09-09 VITALS — BP 147/73 | HR 68 | Temp 99.1°F | Resp 20

## 2019-09-09 DIAGNOSIS — Z5112 Encounter for antineoplastic immunotherapy: Secondary | ICD-10-CM | POA: Diagnosis not present

## 2019-09-09 DIAGNOSIS — D500 Iron deficiency anemia secondary to blood loss (chronic): Secondary | ICD-10-CM

## 2019-09-09 DIAGNOSIS — M1990 Unspecified osteoarthritis, unspecified site: Secondary | ICD-10-CM | POA: Diagnosis not present

## 2019-09-09 DIAGNOSIS — I780 Hereditary hemorrhagic telangiectasia: Secondary | ICD-10-CM

## 2019-09-09 DIAGNOSIS — Z95828 Presence of other vascular implants and grafts: Secondary | ICD-10-CM

## 2019-09-09 LAB — CMP (CANCER CENTER ONLY)
ALT: 14 U/L (ref 0–44)
AST: 20 U/L (ref 15–41)
Albumin: 4 g/dL (ref 3.5–5.0)
Alkaline Phosphatase: 69 U/L (ref 38–126)
Anion gap: 12 (ref 5–15)
BUN: 8 mg/dL (ref 6–20)
CO2: 27 mmol/L (ref 22–32)
Calcium: 9.5 mg/dL (ref 8.9–10.3)
Chloride: 103 mmol/L (ref 98–111)
Creatinine: 0.72 mg/dL (ref 0.44–1.00)
GFR, Est AFR Am: >60 mL/min (ref >60)
GFR, Estimated: >60 mL/min (ref >60)
Glucose, Bld: 101 mg/dL — ABNORMAL HIGH (ref 70–99)
Potassium: 3.4 mmol/L — ABNORMAL LOW (ref 3.5–5.1)
Sodium: 142 mmol/L (ref 135–145)
Total Bilirubin: 0.5 mg/dL (ref 0.3–1.2)
Total Protein: 8.1 g/dL (ref 6.5–8.1)

## 2019-09-09 LAB — CBC WITH DIFFERENTIAL/PLATELET
Abs Immature Granulocytes: 0.05 10*3/uL (ref 0.00–0.07)
Basophils Absolute: 0 10*3/uL (ref 0.0–0.1)
Basophils Relative: 0 %
Eosinophils Absolute: 0.1 10*3/uL (ref 0.0–0.5)
Eosinophils Relative: 1 %
HCT: 32.7 % — ABNORMAL LOW (ref 36.0–46.0)
Hemoglobin: 9.4 g/dL — ABNORMAL LOW (ref 12.0–15.0)
Immature Granulocytes: 0 %
Lymphocytes Relative: 10 %
Lymphs Abs: 1.4 10*3/uL (ref 0.7–4.0)
MCH: 23.2 pg — ABNORMAL LOW (ref 26.0–34.0)
MCHC: 28.7 g/dL — ABNORMAL LOW (ref 30.0–36.0)
MCV: 80.7 fL (ref 80.0–100.0)
Monocytes Absolute: 0.6 10*3/uL (ref 0.1–1.0)
Monocytes Relative: 4 %
Neutro Abs: 11.7 10*3/uL — ABNORMAL HIGH (ref 1.7–7.7)
Neutrophils Relative %: 85 %
Platelets: 316 10*3/uL (ref 150–400)
RBC: 4.05 MIL/uL (ref 3.87–5.11)
RDW: 22.8 % — ABNORMAL HIGH (ref 11.5–15.5)
WBC: 13.9 10*3/uL — ABNORMAL HIGH (ref 4.0–10.5)
nRBC: 0 % (ref 0.0–0.2)

## 2019-09-09 LAB — TOTAL PROTEIN, URINE DIPSTICK: Protein, ur: 300 mg/dL — AB

## 2019-09-09 IMAGING — DX DG CHEST 1V PORT
1 series · 1 of 1 positions shown · non-contrast
Comparison: 09/21/2017

CLINICAL DATA: Acute respiratory failure with hypoxia.

EXAM:
PORTABLE CHEST 1 VIEW

[chest ap]
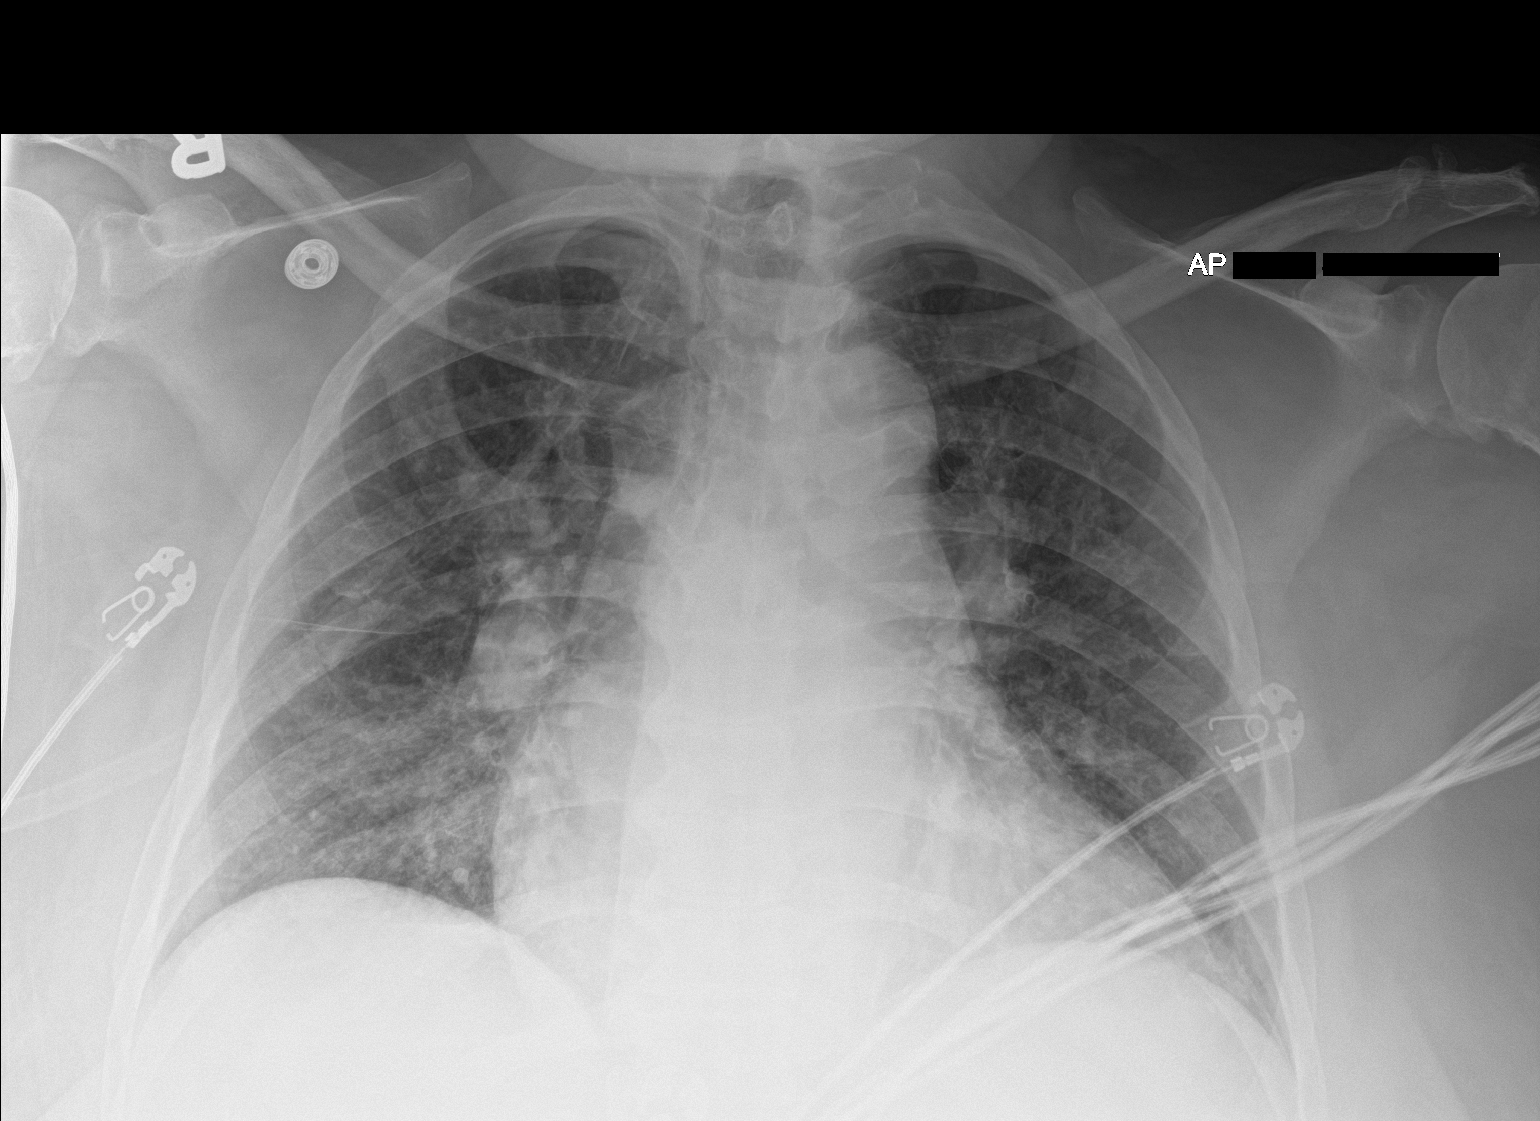

[1 of 1 positions shown; findings below may reference images not displayed]

FINDINGS: Normal heart size. There is no pleural effusion identified. Mild
diffuse pulmonary edema is similar to previous exam. No superimposed
airspace consolidation.
IMPRESSION: 1. Cardiac enlargement and pulmonary edema.

## 2019-09-09 MED ORDER — HEPARIN SOD (PORK) LOCK FLUSH 100 UNIT/ML IV SOLN
500.0000 [IU] | Freq: Once | INTRAVENOUS | Status: AC | PRN
  Administered 2019-09-09: 500 [IU]
  Filled 2019-09-09: qty 5

## 2019-09-09 MED ORDER — SODIUM CHLORIDE 0.9 % IV SOLN
Freq: Once | INTRAVENOUS | Status: AC
  Filled 2019-09-09: qty 250

## 2019-09-09 MED ORDER — SODIUM CHLORIDE 0.9 % IV SOLN
125.0000 mg | Freq: Once | INTRAVENOUS | Status: AC
  Administered 2019-09-09: 125 mg via INTRAVENOUS
  Filled 2019-09-09: qty 10

## 2019-09-09 MED ORDER — SODIUM CHLORIDE 0.9% FLUSH
10.0000 mL | INTRAVENOUS | Status: DC | PRN
  Administered 2019-09-09: 10 mL
  Filled 2019-09-09: qty 10

## 2019-09-09 MED ORDER — SODIUM CHLORIDE 0.9 % IV SOLN
5.0000 mg/kg | Freq: Once | INTRAVENOUS | Status: AC
  Administered 2019-09-09: 600 mg via INTRAVENOUS
  Filled 2019-09-09: qty 8

## 2019-09-09 NOTE — Patient Instructions (Signed)
[facility] Discharge Instructions for Patients Receiving Chemotherapy  Today you received the following chemotherapy agents: Bevacizumab  To help prevent nausea and vomiting after your treatment, we encourage you to take your nausea medication as directed.    If you develop nausea and vomiting that is not controlled by your nausea medication, call the clinic.   BELOW ARE SYMPTOMS THAT SHOULD BE REPORTED IMMEDIATELY:  *FEVER GREATER THAN 100.5 F  *CHILLS WITH OR WITHOUT FEVER  NAUSEA AND VOMITING THAT IS NOT CONTROLLED WITH YOUR NAUSEA MEDICATION  *UNUSUAL SHORTNESS OF BREATH  *UNUSUAL BRUISING OR BLEEDING  TENDERNESS IN MOUTH AND THROAT WITH OR WITHOUT PRESENCE OF ULCERS  *URINARY PROBLEMS  *BOWEL PROBLEMS  UNUSUAL RASH Items with * indicate a potential emergency and should be followed up as soon as possible.  Feel free to call the clinic should you have any questions or concerns. The clinic phone number is [phone].  Please show the [place] at check-in to the Emergency Department and triage nurse.  Sodium Ferric Gluconate Complex injection What is this medicine? SODIUM FERRIC GLUCONATE COMPLEX (SOE dee um FER ik GLOO koe nate KOM pleks) is an iron replacement. It is used with epoetin therapy to treat low iron levels in patients who are receiving hemodialysis. This medicine may be used for other purposes; ask your health care provider or pharmacist if you have questions. COMMON BRAND NAME(S): Ferrlecit, Nulecit What should I tell my health care provider before I take this medicine? They need to know if you have any of the following conditions:  anemia that is not from iron deficiency  high levels of iron in the body  an unusual or allergic reaction to iron, benzyl alcohol, other medicines, foods, dyes, or preservatives  pregnant or are trying to become pregnant  breast-feeding How should I use this medicine? This medicine is for  infusion into a vein. It is given by a health care professional in a hospital or clinic setting. Talk to your pediatrician regarding the use of this medicine in children. While this drug may be prescribed for children as young as 105 years old for selected conditions, precautions do apply. Overdosage: If you think you have taken too much of this medicine contact a poison control center or emergency room at once. NOTE: This medicine is only for you. Do not share this medicine with others. What if I miss a dose? It is important not to miss your dose. Call your doctor or health care professional if you are unable to keep an appointment. What may interact with this medicine? Do not take this medicine with any of the following medications:  deferoxamine  dimercaprol  other iron products This medicine may also interact with the following medications:  chloramphenicol  deferasirox  medicine for blood pressure like enalapril This list may not describe all possible interactions. Give your health care provider a list of all the medicines, herbs, non-prescription drugs, or dietary supplements you use. Also tell them if you smoke, drink alcohol, or use illegal drugs. Some items may interact with your medicine. What should I watch for while using this medicine? Your condition will be monitored carefully while you are receiving this medicine. Visit your doctor for check-ups as directed. What side effects may I notice from receiving this medicine? Side effects that you should report to your doctor or health care professional as soon as possible:  allergic reactions like skin rash, itching or hives, swelling of the face, lips, or  tongue  breathing problems  changes in hearing  changes in vision  chills, flushing, or sweating  fast, irregular heartbeat  feeling faint or lightheaded, falls  fever, flu-like symptoms  high or low blood pressure  pain, tingling, numbness in the hands or  feet  severe pain in the chest, back, flanks, or groin  swelling of the ankles, feet, hands  trouble passing urine or change in the amount of urine  unusually weak or tired Side effects that usually do not require medical attention (report to your doctor or health care professional if they continue or are bothersome):  cramps  dark colored stools  diarrhea  headache  nausea, vomiting  stomach upset This list may not describe all possible side effects. Call your doctor for medical advice about side effects. You may report side effects to FDA at 1-800-FDA-1088. Where should I keep my medicine? This drug is given in a hospital or clinic and will not be stored at home. NOTE: This sheet is a summary. It may not cover all possible information. If you have questions about this medicine, talk to your doctor, pharmacist, or health care provider.  2020 Elsevier/Gold Standard (2007-12-02 15:58:57)

## 2019-09-10 ENCOUNTER — Other Ambulatory Visit: Payer: Self-pay | Admitting: Hematology

## 2019-09-10 DIAGNOSIS — M1990 Unspecified osteoarthritis, unspecified site: Secondary | ICD-10-CM | POA: Diagnosis not present

## 2019-09-10 LAB — IRON AND TIBC
Iron: 29 ug/dL — ABNORMAL LOW (ref 41–142)
Saturation Ratios: 11 % — ABNORMAL LOW (ref 21–57)
TIBC: 268 ug/dL (ref 236–444)
UIBC: 239 ug/dL (ref 120–384)

## 2019-09-10 LAB — FERRITIN: Ferritin: 76 ng/mL (ref 11–307)

## 2019-09-13 DIAGNOSIS — M1990 Unspecified osteoarthritis, unspecified site: Secondary | ICD-10-CM | POA: Diagnosis not present

## 2019-09-14 DIAGNOSIS — M1990 Unspecified osteoarthritis, unspecified site: Secondary | ICD-10-CM | POA: Diagnosis not present

## 2019-09-15 DIAGNOSIS — M1990 Unspecified osteoarthritis, unspecified site: Secondary | ICD-10-CM | POA: Diagnosis not present

## 2019-09-16 ENCOUNTER — Inpatient Hospital Stay: Payer: Medicaid Other

## 2019-09-16 ENCOUNTER — Other Ambulatory Visit: Payer: Self-pay

## 2019-09-16 ENCOUNTER — Inpatient Hospital Stay: Payer: Medicaid Other | Attending: Hematology

## 2019-09-16 VITALS — BP 150/80 | HR 72 | Temp 98.0°F | Resp 18

## 2019-09-16 DIAGNOSIS — D500 Iron deficiency anemia secondary to blood loss (chronic): Secondary | ICD-10-CM

## 2019-09-16 DIAGNOSIS — Z95828 Presence of other vascular implants and grafts: Secondary | ICD-10-CM

## 2019-09-16 DIAGNOSIS — I780 Hereditary hemorrhagic telangiectasia: Secondary | ICD-10-CM

## 2019-09-16 DIAGNOSIS — M1990 Unspecified osteoarthritis, unspecified site: Secondary | ICD-10-CM | POA: Diagnosis not present

## 2019-09-16 LAB — CBC WITH DIFFERENTIAL (CANCER CENTER ONLY)
Abs Immature Granulocytes: 0.04 10*3/uL (ref 0.00–0.07)
Basophils Absolute: 0 10*3/uL (ref 0.0–0.1)
Basophils Relative: 0 %
Eosinophils Absolute: 0.1 10*3/uL (ref 0.0–0.5)
Eosinophils Relative: 1 %
HCT: 32.4 % — ABNORMAL LOW (ref 36.0–46.0)
Hemoglobin: 9.5 g/dL — ABNORMAL LOW (ref 12.0–15.0)
Immature Granulocytes: 0 %
Lymphocytes Relative: 12 %
Lymphs Abs: 1.5 10*3/uL (ref 0.7–4.0)
MCH: 23.8 pg — ABNORMAL LOW (ref 26.0–34.0)
MCHC: 29.3 g/dL — ABNORMAL LOW (ref 30.0–36.0)
MCV: 81 fL (ref 80.0–100.0)
Monocytes Absolute: 0.6 10*3/uL (ref 0.1–1.0)
Monocytes Relative: 5 %
Neutro Abs: 11.1 10*3/uL — ABNORMAL HIGH (ref 1.7–7.7)
Neutrophils Relative %: 82 %
Platelet Count: 270 10*3/uL (ref 150–400)
RBC: 4 MIL/uL (ref 3.87–5.11)
RDW: 23.1 % — ABNORMAL HIGH (ref 11.5–15.5)
WBC Count: 13.4 10*3/uL — ABNORMAL HIGH (ref 4.0–10.5)
nRBC: 0 % (ref 0.0–0.2)

## 2019-09-16 LAB — IRON AND TIBC
Iron: 30 ug/dL — ABNORMAL LOW (ref 41–142)
Saturation Ratios: 11 % — ABNORMAL LOW (ref 21–57)
TIBC: 280 ug/dL (ref 236–444)
UIBC: 250 ug/dL (ref 120–384)

## 2019-09-16 LAB — FERRITIN: Ferritin: 104 ng/mL (ref 11–307)

## 2019-09-16 MED ORDER — SODIUM CHLORIDE 0.9% FLUSH
10.0000 mL | Freq: Once | INTRAVENOUS | Status: AC
  Administered 2019-09-16: 10 mL
  Filled 2019-09-16: qty 10

## 2019-09-16 MED ORDER — HEPARIN SOD (PORK) LOCK FLUSH 100 UNIT/ML IV SOLN
500.0000 [IU] | Freq: Once | INTRAVENOUS | Status: AC
  Administered 2019-09-16: 500 [IU]
  Filled 2019-09-16: qty 5

## 2019-09-16 MED ORDER — SODIUM CHLORIDE 0.9 % IV SOLN
INTRAVENOUS | Status: DC
  Filled 2019-09-16: qty 250

## 2019-09-16 MED ORDER — SODIUM CHLORIDE 0.9 % IV SOLN
125.0000 mg | Freq: Once | INTRAVENOUS | Status: AC
  Administered 2019-09-16: 125 mg via INTRAVENOUS
  Filled 2019-09-16: qty 10

## 2019-09-16 NOTE — Patient Instructions (Signed)
Sodium Ferric Gluconate Complex injection What is this medicine? SODIUM FERRIC GLUCONATE COMPLEX (SOE dee um FER ik GLOO koe nate KOM pleks) is an iron replacement. It is used with epoetin therapy to treat low iron levels in patients who are receiving hemodialysis. This medicine may be used for other purposes; ask your health care provider or pharmacist if you have questions. COMMON BRAND NAME(S): Ferrlecit, Nulecit What should I tell my health care provider before I take this medicine? They need to know if you have any of the following conditions:  anemia that is not from iron deficiency  high levels of iron in the body  an unusual or allergic reaction to iron, benzyl alcohol, other medicines, foods, dyes, or preservatives  pregnant or are trying to become pregnant  breast-feeding How should I use this medicine? This medicine is for infusion into a vein. It is given by a health care professional in a hospital or clinic setting. Talk to your pediatrician regarding the use of this medicine in children. While this drug may be prescribed for children as young as 6 years old for selected conditions, precautions do apply. Overdosage: If you think you have taken too much of this medicine contact a poison control center or emergency room at once. NOTE: This medicine is only for you. Do not share this medicine with others. What if I miss a dose? It is important not to miss your dose. Call your doctor or health care professional if you are unable to keep an appointment. What may interact with this medicine? Do not take this medicine with any of the following medications:  deferoxamine  dimercaprol  other iron products This medicine may also interact with the following medications:  chloramphenicol  deferasirox  medicine for blood pressure like enalapril This list may not describe all possible interactions. Give your health care provider a list of all the medicines, herbs,  non-prescription drugs, or dietary supplements you use. Also tell them if you smoke, drink alcohol, or use illegal drugs. Some items may interact with your medicine. What should I watch for while using this medicine? Your condition will be monitored carefully while you are receiving this medicine. Visit your doctor for check-ups as directed. What side effects may I notice from receiving this medicine? Side effects that you should report to your doctor or health care professional as soon as possible:  allergic reactions like skin rash, itching or hives, swelling of the face, lips, or tongue  breathing problems  changes in hearing  changes in vision  chills, flushing, or sweating  fast, irregular heartbeat  feeling faint or lightheaded, falls  fever, flu-like symptoms  high or low blood pressure  pain, tingling, numbness in the hands or feet  severe pain in the chest, back, flanks, or groin  swelling of the ankles, feet, hands  trouble passing urine or change in the amount of urine  unusually weak or tired Side effects that usually do not require medical attention (report to your doctor or health care professional if they continue or are bothersome):  cramps  dark colored stools  diarrhea  headache  nausea, vomiting  stomach upset This list may not describe all possible side effects. Call your doctor for medical advice about side effects. You may report side effects to FDA at 1-800-FDA-1088. Where should I keep my medicine? This drug is given in a hospital or clinic and will not be stored at home. NOTE: This sheet is a summary. It may not cover all   possible information. If you have questions about this medicine, talk to your doctor, pharmacist, or health care provider.  2020 Elsevier/Gold Standard (2007-12-02 15:58:57)  

## 2019-09-17 ENCOUNTER — Emergency Department (HOSPITAL_COMMUNITY)
Admission: EM | Admit: 2019-09-17 | Discharge: 2019-09-18 | Disposition: A | Discharge status: Home / Self Care | Payer: Medicaid Other | Attending: Emergency Medicine | Admitting: Emergency Medicine

## 2019-09-17 ENCOUNTER — Emergency Department (HOSPITAL_COMMUNITY): Payer: Medicaid Other

## 2019-09-17 ENCOUNTER — Other Ambulatory Visit: Payer: Self-pay

## 2019-09-17 ENCOUNTER — Encounter (HOSPITAL_COMMUNITY): Payer: Self-pay | Admitting: Emergency Medicine

## 2019-09-17 DIAGNOSIS — F17210 Nicotine dependence, cigarettes, uncomplicated: Secondary | ICD-10-CM | POA: Diagnosis not present

## 2019-09-17 DIAGNOSIS — Z7984 Long term (current) use of oral hypoglycemic drugs: Secondary | ICD-10-CM | POA: Insufficient documentation

## 2019-09-17 DIAGNOSIS — E119 Type 2 diabetes mellitus without complications: Secondary | ICD-10-CM | POA: Insufficient documentation

## 2019-09-17 DIAGNOSIS — M542 Cervicalgia: Secondary | ICD-10-CM | POA: Diagnosis not present

## 2019-09-17 DIAGNOSIS — M1990 Unspecified osteoarthritis, unspecified site: Secondary | ICD-10-CM | POA: Diagnosis not present

## 2019-09-17 DIAGNOSIS — Z79899 Other long term (current) drug therapy: Secondary | ICD-10-CM | POA: Insufficient documentation

## 2019-09-17 DIAGNOSIS — M25512 Pain in left shoulder: Secondary | ICD-10-CM | POA: Insufficient documentation

## 2019-09-17 DIAGNOSIS — R079 Chest pain, unspecified: Secondary | ICD-10-CM | POA: Diagnosis not present

## 2019-09-17 DIAGNOSIS — I10 Essential (primary) hypertension: Secondary | ICD-10-CM | POA: Insufficient documentation

## 2019-09-17 LAB — BASIC METABOLIC PANEL
Anion gap: 14 (ref 5–15)
BUN: 5 mg/dL — ABNORMAL LOW (ref 6–20)
CO2: 24 mmol/L (ref 22–32)
Calcium: 9.7 mg/dL (ref 8.9–10.3)
Chloride: 102 mmol/L (ref 98–111)
Creatinine, Ser: 0.69 mg/dL (ref 0.44–1.00)
GFR calc Af Amer: >60 mL/min (ref >60)
GFR calc non Af Amer: >60 mL/min (ref >60)
Glucose, Bld: 110 mg/dL — ABNORMAL HIGH (ref 70–99)
Potassium: 3 mmol/L — ABNORMAL LOW (ref 3.5–5.1)
Sodium: 140 mmol/L (ref 135–145)

## 2019-09-17 LAB — CBC
HCT: 34.7 % — ABNORMAL LOW (ref 36.0–46.0)
Hemoglobin: 10 g/dL — ABNORMAL LOW (ref 12.0–15.0)
MCH: 23.4 pg — ABNORMAL LOW (ref 26.0–34.0)
MCHC: 28.8 g/dL — ABNORMAL LOW (ref 30.0–36.0)
MCV: 81.3 fL (ref 80.0–100.0)
Platelets: 289 10*3/uL (ref 150–400)
RBC: 4.27 MIL/uL (ref 3.87–5.11)
RDW: 23.5 % — ABNORMAL HIGH (ref 11.5–15.5)
WBC: 13.7 10*3/uL — ABNORMAL HIGH (ref 4.0–10.5)
nRBC: 0 % (ref 0.0–0.2)

## 2019-09-17 LAB — I-STAT BETA HCG BLOOD, ED (MC, WL, AP ONLY): I-stat hCG, quantitative: <5 m[IU]/mL (ref <5)

## 2019-09-17 LAB — TROPONIN I (HIGH SENSITIVITY): Troponin I (High Sensitivity): 8 ng/L (ref <18)

## 2019-09-17 MED ORDER — SODIUM CHLORIDE 0.9% FLUSH: 3.0000 mL | Freq: Once | INTRAVENOUS | Status: DC

## 2019-09-17 NOTE — ED Triage Notes (Signed)
Pt c/o sharp left neck and shoulder pain since this morning.

## 2019-09-18 LAB — TROPONIN I (HIGH SENSITIVITY): Troponin I (High Sensitivity): 10 ng/L (ref <18)

## 2019-09-18 MED ORDER — CYCLOBENZAPRINE HCL 10 MG PO TABS
10.0000 mg | ORAL_TABLET | Freq: Two times a day (BID) | ORAL | 0 refills | Status: DC | PRN
Start: 2019-09-18 — End: 2020-03-03

## 2019-09-18 NOTE — Discharge Instructions (Signed)
Follow-up with your primary care doctor.  Continue Tylenol Motrin for pain as needed as well.  Recommend buying over-the-counter lidocaine patches as well.  Use Flexeril as prescribed but do not mix with alcohol or drugs or heavy machinery use.

## 2019-09-18 NOTE — ED Provider Notes (Signed)
[facility] EMERGENCY DEPARTMENT Provider Note   CSN: [id number] Arrival date & time: 09/17/19  2149     History Chief Complaint  Patient presents with  . Neck Pain    Peggy House is a 59 y.o. female.  The history is provided by the patient.  Illness Location:  Neck Quality:  Pain Severity:  Mild Onset quality:  Gradual Timing:  Intermittent Progression:  Waxing and waning Chronicity:  New Context:  Left side neck and shoulder pain, thinks she slept on it funny. No hx of trauma. No chest pain, No sob, Relieved by:  Nothing Worsened by:  Movement Associated symptoms: no abdominal pain, no chest pain, no congestion, no cough, no diarrhea, no ear pain, no fatigue, no fever, no headaches, no loss of consciousness, no myalgias, no nausea, no rash, no rhinorrhea, no shortness of breath, no sore throat, no vomiting and no wheezing        Past Medical History:  Diagnosis Date  . Anxiety   . Arthritis    knnes,back  . GERD (gastroesophageal reflux disease)   . 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 History of swelling of feet   . Hyperlipidemia   . Hypertension   . Major depressive disorder, recurrent episode ([place]) 06/05/2015  . Obesity   . Snores   . 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 Pain due to onychomycosis of toenails of both feet 02/26/2019  . 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 Anemia due to GI blood loss 12/07/2018  . Aspiration pneumonitis ([place]) 12/07/2018  . 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 Acute deep vein thrombosis (DVT) of right upper extremity ([place]) 12/07/2018  . Hypokalemia   . Port-A-Cath in place 09/18/2018  . GAD (generalized anxiety disorder) 01/20/2018  . Need for influenza vaccination 01/20/2018  . Upper GI bleed 10/25/2017  . Pulmonary artery hypertension  ([place]) 10/19/2017  . Acute hypoxemic respiratory failure ([place])   . Symptomatic anemia 05/20/2017  . 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 Venous stasis dermatitis of both lower extremities 05/07/2017  . Recurrent epistaxis 11/29/2016  . Iron deficiency anemia 10/29/2016  . Cough 05/13/2016  . Osteoarthritis, multiple sites 06/05/2015  . Insomnia 06/05/2015  . Major depressive disorder, recurrent episode ([place]) 06/05/2015  . Obesity, Class III, BMI 40-49.9 (morbid obesity) ([place]) 04/26/2014  . GI bleed 04/25/2014  . HLD (hyperlipidemia) 04/25/2014  . Leg swelling 04/25/2014  . Essential hypertension   . Acute on chronic blood loss anemia 04/07/2014    Past Surgical History:  Procedure Laterality Date  . ABDOMINAL HYSTERECTOMY    . CARPAL TUNNEL RELEASE  05/13/2011   Procedure: CARPAL TUNNEL RELEASE;  Surgeon: Nita Sells, MD;  Location: [facility];  Service: Orthopedics;  Laterality: Left;  . COLONOSCOPY WITH PROPOFOL N/A 04/28/2014   Procedure: COLONOSCOPY WITH PROPOFOL;  Surgeon: Cleotis Nipper, MD;  Location: [facility] ENDOSCOPY;  Service: Endoscopy;  Laterality: N/A;  . DG TOES*L*  2/10   rt  . DILATION AND CURETTAGE OF UTERUS    . ENTEROSCOPY N/A 10/17/2017   Procedure: ENTEROSCOPY;  Surgeon: Otis Brace, MD;  Location: [facility] ENDOSCOPY;  Service: Gastroenterology;  Laterality: N/A;  . ESOPHAGOGASTRODUODENOSCOPY N/A 04/10/2014   Procedure: ESOPHAGOGASTRODUODENOSCOPY (EGD);  Surgeon: [name], MD;  Location[facility] ENDOSCOPY;  Service: Endoscopy;  Laterality: N/A;  .  ESOPHAGOGASTRODUODENOSCOPY N/A 05/10/2017   Procedure: ESOPHAGOGASTRODUODENOSCOPY (EGD);  Surgeon: Ronald Lobo, MD;  Location: [facility] ENDOSCOPY;  Service: Endoscopy;  Laterality: N/A;  . ESOPHAGOGASTRODUODENOSCOPY N/A 09/22/2017   Procedure: ESOPHAGOGASTRODUODENOSCOPY (EGD);  Surgeon: Clarene Essex, MD;  Location: [facility];  Service: Endoscopy;  Laterality: N/A;  bedside    . ESOPHAGOGASTRODUODENOSCOPY (EGD) WITH PROPOFOL N/A 04/27/2014   Procedure: ESOPHAGOGASTRODUODENOSCOPY (EGD) WITH PROPOFOL;  Surgeon: Cleotis Nipper, MD;  Location: [facility] [facility];  Service: Endoscopy;  Laterality: N/A;  possible apc  . ESOPHAGOGASTRODUODENOSCOPY (EGD) WITH PROPOFOL N/A 09/30/2017   Procedure: ESOPHAGOGASTRODUODENOSCOPY (EGD) WITH PROPOFOL;  Surgeon: [name], MD;  Location: [facility];  Service: Gastroenterology;  Laterality: N/A;  . ESOPHAGOGASTRODUODENOSCOPY (EGD) WITH PROPOFOL N/A 10/01/2017   Procedure: ESOPHAGOGASTRODUODENOSCOPY (EGD) WITH PROPOFOL;  Surgeon: [name], MD;  Location: [facility];  Service: Gastroenterology;  Laterality: N/A;  . ESOPHAGOGASTRODUODENOSCOPY (EGD) WITH PROPOFOL N/A 10/08/2017   Procedure: ESOPHAGOGASTRODUODENOSCOPY (EGD) WITH PROPOFOL;  Surgeon: Otis Brace, MD;  Location: [facility] ENDOSCOPY;  Service: Gastroenterology;  Laterality: N/A;  . ESOPHAGOGASTRODUODENOSCOPY (EGD) WITH PROPOFOL N/A 10/17/2017   Procedure: ESOPHAGOGASTRODUODENOSCOPY (EGD) WITH PROPOFOL;  Surgeon: Otis Brace, MD;  Location: [facility] ENDOSCOPY;  Service: Gastroenterology;  Laterality: N/A;  . ESOPHAGOGASTRODUODENOSCOPY (EGD) WITH PROPOFOL N/A 10/19/2017   Procedure: ESOPHAGOGASTRODUODENOSCOPY (EGD) WITH PROPOFOL;  Surgeon: Otis Brace, MD;  Location: [facility] ENDOSCOPY;  Service: Gastroenterology;  Laterality: N/A;  . ESOPHAGOGASTRODUODENOSCOPY (EGD) WITH PROPOFOL N/A 12/04/2018   Procedure: ESOPHAGOGASTRODUODENOSCOPY (EGD) WITH PROPOFOL;  Surgeon: Wilford Corner, MD;  Location: [facility] ENDOSCOPY;  Service: Endoscopy;  Laterality: N/A;  . GIVENS CAPSULE STUDY N/A 10/02/2017   Procedure: GIVENS CAPSULE STUDY;  Surgeon: [name], MD;  Location: [facility];  Service: Gastroenterology;  Laterality: N/A;  . GIVENS CAPSULE STUDY N/A 10/08/2017   Procedure: GIVENS CAPSULE STUDY;  Surgeon: Otis Brace, MD;  Location: [facility];  Service: Gastroenterology;  Laterality:  N/A;  endoscopic placement of capsule  . HEMOSTASIS CLIP PLACEMENT  12/04/2018   Procedure: HEMOSTASIS CLIP PLACEMENT;  Surgeon: Wilford Corner, MD;  Location: [facility] ENDOSCOPY;  Service: Endoscopy;;  . HOT HEMOSTASIS N/A 04/27/2014   Procedure: HOT HEMOSTASIS (ARGON PLASMA COAGULATION/BICAP);  Surgeon: Cleotis Nipper, MD;  Location: [facility] ENDOSCOPY;  Service: Endoscopy;  Laterality: N/A;  . HOT HEMOSTASIS N/A 09/30/2017   Procedure: HOT HEMOSTASIS (ARGON PLASMA COAGULATION/BICAP);  Surgeon: [name], MD;  Location: [facility];  Service: Gastroenterology;  Laterality: N/A;  . HOT HEMOSTASIS N/A 10/01/2017   Procedure: HOT HEMOSTASIS (ARGON PLASMA COAGULATION/BICAP);  Surgeon: [name], MD;  Location: [facility];  Service: Gastroenterology;  Laterality: N/A;  . HOT HEMOSTASIS N/A 10/17/2017   Procedure: HOT HEMOSTASIS (ARGON PLASMA COAGULATION/BICAP);  Surgeon: Otis Brace, MD;  Location: [facility] ENDOSCOPY;  Service: Gastroenterology;  Laterality: N/A;  . HOT HEMOSTASIS N/A 10/19/2017   Procedure: HOT HEMOSTASIS (ARGON PLASMA COAGULATION/BICAP);  Surgeon: Otis Brace, MD;  Location: [facility] ENDOSCOPY;  Service: Gastroenterology;  Laterality: N/A;  . IR IMAGING GUIDED PORT INSERTION  07/08/2018  . L shoulder Surgery  2011  . SUBMUCOSAL INJECTION  09/22/2017   Procedure: SUBMUCOSAL INJECTION;  Surgeon: Clarene Essex, MD;  Location: [facility] ENDOSCOPY;  Service: Endoscopy;;  . SUBMUCOSAL INJECTION  12/04/2018   Procedure: SUBMUCOSAL INJECTION;  Surgeon: Wilford Corner, MD;  Location: [facility] ENDOSCOPY;  Service: Endoscopy;;     OB History   No obstetric history on file.     Family History  Problem Relation Age of Onset  . Cancer Mother        Ovarian  .  Ovarian cancer Mother   . Diabetes Mother   . Kidney disease Mother   . Bleeding Disorder Mother   . Diabetes type II Sister   . Bleeding Disorder Sister   . Diabetes Sister   . Colon cancer Maternal Grandfather   . Crohn's disease Maternal  Grandfather   . Stomach cancer Maternal Grandmother   . Kidney disease Son   . Bleeding Disorder Son   . Dysmenorrhea Neg Hx     Social History   Tobacco Use  . Smoking status: Current Every Day Smoker    Packs/day: 0.50    Years: 30.00    Pack years: 15.00    Types: Cigarettes  . Smokeless tobacco: Former Systems developer    Types: Snuff    Quit date: 19  . Tobacco comment: 1/2 PPD  Substance Use Topics  . Alcohol use: No    Alcohol/week: 0.0 standard drinks  . Drug use: No    Comment: last cocaine-2010    Home Medications Prior to Admission medications   Medication Sig Start Date End Date Taking? Authorizing Provider  acitretin (SORIATANE) 10 MG capsule Take by mouth. 06/21/19 09/19/19 Yes [provider]  Aminocaproic Acid 1000 MG TABS TAKE 1 TABLET(1000 MG) BY MOUTH TWICE DAILY Patient taking differently: Take 1,000 mg by mouth.  08/18/19  Yes Truitt Merle, MD  amLODipine (NORVASC) 10 MG tablet TAKE 1 TABLET(10 MG) BY MOUTH DAILY 03/02/19  Yes [name], MD  atorvastatin (LIPITOR) 80 MG tablet TAKE 1 TABLET(80 MG) BY MOUTH DAILY Patient taking differently: Take 80 mg by mouth daily.  06/17/19  Yes Truitt Merle, MD  cetirizine (ZYRTEC) 10 MG tablet TAKE 1 TABLET(10 MG) BY MOUTH DAILY Patient taking differently: Take 10 mg by mouth daily.  07/09/19  Yes [name], MD  diphenhydramine-acetaminophen (TYLENOL PM) 25-500 MG TABS tablet Take 1 tablet by mouth at bedtime as needed (sleep).   Yes [provider]  diphenoxylate-atropine (LOMOTIL) 2.5-0.025 MG tablet 1 to 2 PO QID prn diarrhea 04/15/19  Yes Tanner, Lyndon Code., PA-C  hydrochlorothiazide (HYDRODIURIL) 12.5 MG tablet TAKE 1 TABLET(12.5 MG) BY MOUTH DAILY Patient taking differently: Take 12.5 mg by mouth daily.  05/27/19  Yes [name], MD  lidocaine-prilocaine (EMLA) cream Apply 1 application topically as needed. 07/17/18  Yes Truitt Merle, MD  metFORMIN (GLUCOPHAGE) 500 MG tablet TAKE 1 TABLET(500 MG) BY  MOUTH TWICE DAILY WITH A MEAL Patient taking differently: Take 500 mg by mouth 2 (two) times daily with a meal.  02/19/19  Yes Truitt Merle, MD  pantoprazole (PROTONIX) 40 MG tablet Take 1 tablet (40 mg total) by mouth 2 (two) times daily. 03/02/19  Yes [name], MD  Podiatric Products (FLEXITOL HEEL BALM) OINT Apply topically. 06/10/19  Yes [provider]  potassium chloride (KLOR-CON) 20 MEQ packet Take 20 mEq by mouth daily. 12/10/18  Yes Truitt Merle, MD  prochlorperazine (COMPAZINE) 10 MG tablet Take 1 tablet (10 mg total) by mouth every 6 (six) hours as needed for nausea or vomiting. 10/14/18  Yes Alla Feeling, NP  sertraline (ZOLOFT) 100 MG tablet TAKE 1 TABLET(100 MG) BY MOUTH DAILY Patient taking differently: Take 100 mg by mouth daily.  05/27/19  Yes [name], MD  traZODone (DESYREL) 50 MG tablet TAKE 1 TABLET(50 MG) BY MOUTH AT BEDTIME Patient taking differently: Take 50 mg by mouth at bedtime.  08/16/19  Yes [name], MD  albuterol (PROVENTIL HFA;VENTOLIN HFA) 108 [phone] Base)  MCG/ACT inhaler Inhale 1-2 puffs into the lungs every 6 (six) hours as needed for wheezing or shortness of breath. Patient not taking: Reported on 09/18/2019 04/03/17   Molt, Bethany, DO  cyclobenzaprine (FLEXERIL) 10 MG tablet Take 1 tablet (10 mg total) by mouth 2 (two) times daily as needed for up to 20 doses for muscle spasms. 09/18/19   Aubryana Vittorio, DO    Allergies    Feraheme [ferumoxytol], Nsaids, Tomato, and Wasp venom  Review of Systems   Review of Systems  Constitutional: Negative for chills, fatigue and fever.  HENT: Negative for congestion, ear pain, rhinorrhea and sore throat.   Eyes: Negative for pain and visual disturbance.  Respiratory: Negative for cough, shortness of breath and wheezing.   Cardiovascular: Negative for chest pain and palpitations.  Gastrointestinal: Negative for abdominal pain, diarrhea, nausea and vomiting.  Genitourinary: Negative for dysuria and  hematuria.  Musculoskeletal: Positive for arthralgias, neck pain and neck stiffness. Negative for back pain and myalgias.  Skin: Negative for color change and rash.  Neurological: Negative for dizziness, tremors, seizures, loss of consciousness, syncope, facial asymmetry, speech difficulty, weakness, light-headedness, numbness and headaches.  All other systems reviewed and are negative.   Physical Exam Updated Vital Signs  ED Triage Vitals  Enc Vitals Group     BP 09/17/19 2238 (!) 189/95     Pulse Rate 09/17/19 2238 86     Resp 09/17/19 2238 19     Temp 09/17/19 2238 98.8 F (37.1 C)     Temp Source 09/17/19 2238 Oral     SpO2 09/17/19 2238 98 %     Weight 09/17/19 2239 260 lb 5.8 oz (118.1 kg)     Height 09/17/19 2239 5' 5.5" (1.664 m)     Head Circumference --      Peak Flow --      Pain Score 09/17/19 2239 10     Pain Loc --      Pain Edu? --      Excl. in [place]? --     Physical Exam Vitals and nursing note reviewed.  Constitutional:      General: She is not in acute distress.    Appearance: She is well-developed. She is not ill-appearing.  HENT:     Head: Normocephalic and atraumatic.     Nose: Nose normal.     Mouth/Throat:     Mouth: Mucous membranes are moist.  Eyes:     Extraocular Movements: Extraocular movements intact.     Conjunctiva/sclera: Conjunctivae normal.     Pupils: Pupils are equal, round, and reactive to light.  Neck:     Comments: No midline spinal tenderness, tenderness to paraspinal cervical muscles on the left/left trapezius area Cardiovascular:     Rate and Rhythm: Normal rate and regular rhythm.     Pulses: Normal pulses.     Heart sounds: Normal heart sounds. No murmur.  Pulmonary:     Effort: Pulmonary effort is normal. No respiratory distress.     Breath sounds: Normal breath sounds.  Abdominal:     General: Abdomen is flat. There is no distension.     Palpations: Abdomen is soft.     Tenderness: There is no abdominal tenderness.    Musculoskeletal:        General: Tenderness present.     Cervical back: Normal range of motion and neck supple. Tenderness present.     Comments: Tenderness to left trapezius area  Skin:    General: Skin  is warm and dry.     Capillary Refill: Capillary refill takes less than 2 seconds.  Neurological:     General: No focal deficit present.     Mental Status: She is alert and oriented to person, place, and time.     Cranial Nerves: No cranial nerve deficit.     Sensory: No sensory deficit.     Motor: No weakness.     Comments: 5+ out of 5 strength throughout, normal sensation  Psychiatric:        Mood and Affect: Mood normal.     ED Results / Procedures / Treatments   Labs (all labs ordered are listed, but only abnormal results are displayed) Labs Reviewed  BASIC METABOLIC PANEL - Abnormal; Notable for the following components:      Result Value   Potassium 3.0 (*)    Glucose, Bld 110 (*)    BUN 5 (*)    All other components within normal limits  CBC - Abnormal; Notable for the following components:   WBC 13.7 (*)    Hemoglobin 10.0 (*)    HCT 34.7 (*)    MCH 23.4 (*)    MCHC 28.8 (*)    RDW 23.5 (*)    All other components within normal limits  I-STAT BETA HCG BLOOD, ED ([facility], [facility], [facility] ONLY)  TROPONIN I (HIGH SENSITIVITY)  TROPONIN I (HIGH SENSITIVITY)    EKG EKG Interpretation  Date/Time:  Friday September 17 2019 22:35:47 EDT Ventricular Rate:  83 PR Interval:  186 QRS Duration: 88 QT Interval:  408 QTC Calculation: 479 R Axis:   -22 Text Interpretation: Normal sinus rhythm Minimal voltage criteria for LVH, may be normal variant ( R in aVL ) Confirmed by Lennice Sites [phone]) on 09/18/2019 7:46:02 AM   Radiology DG Chest 2 View  Result Date: 09/17/2019 CLINICAL DATA:  Chest pain, sharp sided left neck and shoulder pain since this morning EXAM: CHEST - 2 VIEW COMPARISON:  12/05/2018 FINDINGS: Frontal and lateral views of the chest demonstrate stable right chest wall  port. The cardiac silhouette is unremarkable. No airspace disease, effusion, or pneumothorax. No acute bony abnormalities. IMPRESSION: 1. No acute intrathoracic process. Electronically Signed   By: Randa Ngo M.D.   On: 09/17/2019 23:04    Procedures Procedures (including critical care time)  Medications Ordered in ED Medications  sodium chloride flush (NS) 0.9 % injection 3 mL (has no administration in time range)    ED Course  I have reviewed the triage vital signs and the nursing notes.  Pertinent labs & imaging results that were available during my care of the patient were reviewed by me and considered in my medical decision making (see chart for details).    MDM Rules/Calculators/A&P                      Peggy House is a 59 year old female with history of hypertension, high cholesterol, diabetes who presents to the ED with neck pain.  Patient with normal vitals.  Denies any trauma.  Lab work and imaging was collected prior to my evaluation.  Patient had EKG that showed sinus rhythm.  Normal troponin.  Chest x-ray with no signs of pneumonia, no pneumothorax, no pleural effusion.  No significant anemia, electrolyte abnormality, kidney injury.  Overall history and physicals consistent with muscle strain, cervical strain.  Patient with tenderness to the paraspinal cervical muscles on the left and left trapezius area.  Has normal strength and  sensation in the upper extremities.  Normal neurological exam overall.  Denies any chest pain or shortness of breath.  Doubt cardiac or pulmonary process.  No concern for spinal process.  Recommend Tylenol, Motrin, Flexeril, lidocaine patch.  Recommend follow-up with primary care doctor and discharged from ED in good condition.  Given return precautions.  This chart was dictated using voice recognition software.  Despite best efforts to proofread,  errors can occur which can change the documentation meaning.    Final Clinical Impression(s) / ED  Diagnoses Final diagnoses:  Neck pain    Rx / DC Orders ED Discharge Orders         Ordered    cyclobenzaprine (FLEXERIL) 10 MG tablet  2 times daily PRN     09/18/19 0820           Lennice Sites, DO 09/18/19 3709

## 2019-09-19 IMAGING — DX DG ABD PORTABLE 1V
2 series · 2 of 2 positions shown · non-contrast
Comparison: CT, 05/06/2016

CLINICAL DATA: Foreign body alimentary tract per ordering notes, pt
denies eating or swallowing anything. Pt states "I think they are
looking for blood."

EXAM:
PORTABLE ABDOMEN - 1 VIEW

[abdomen kub (1 of 2)]
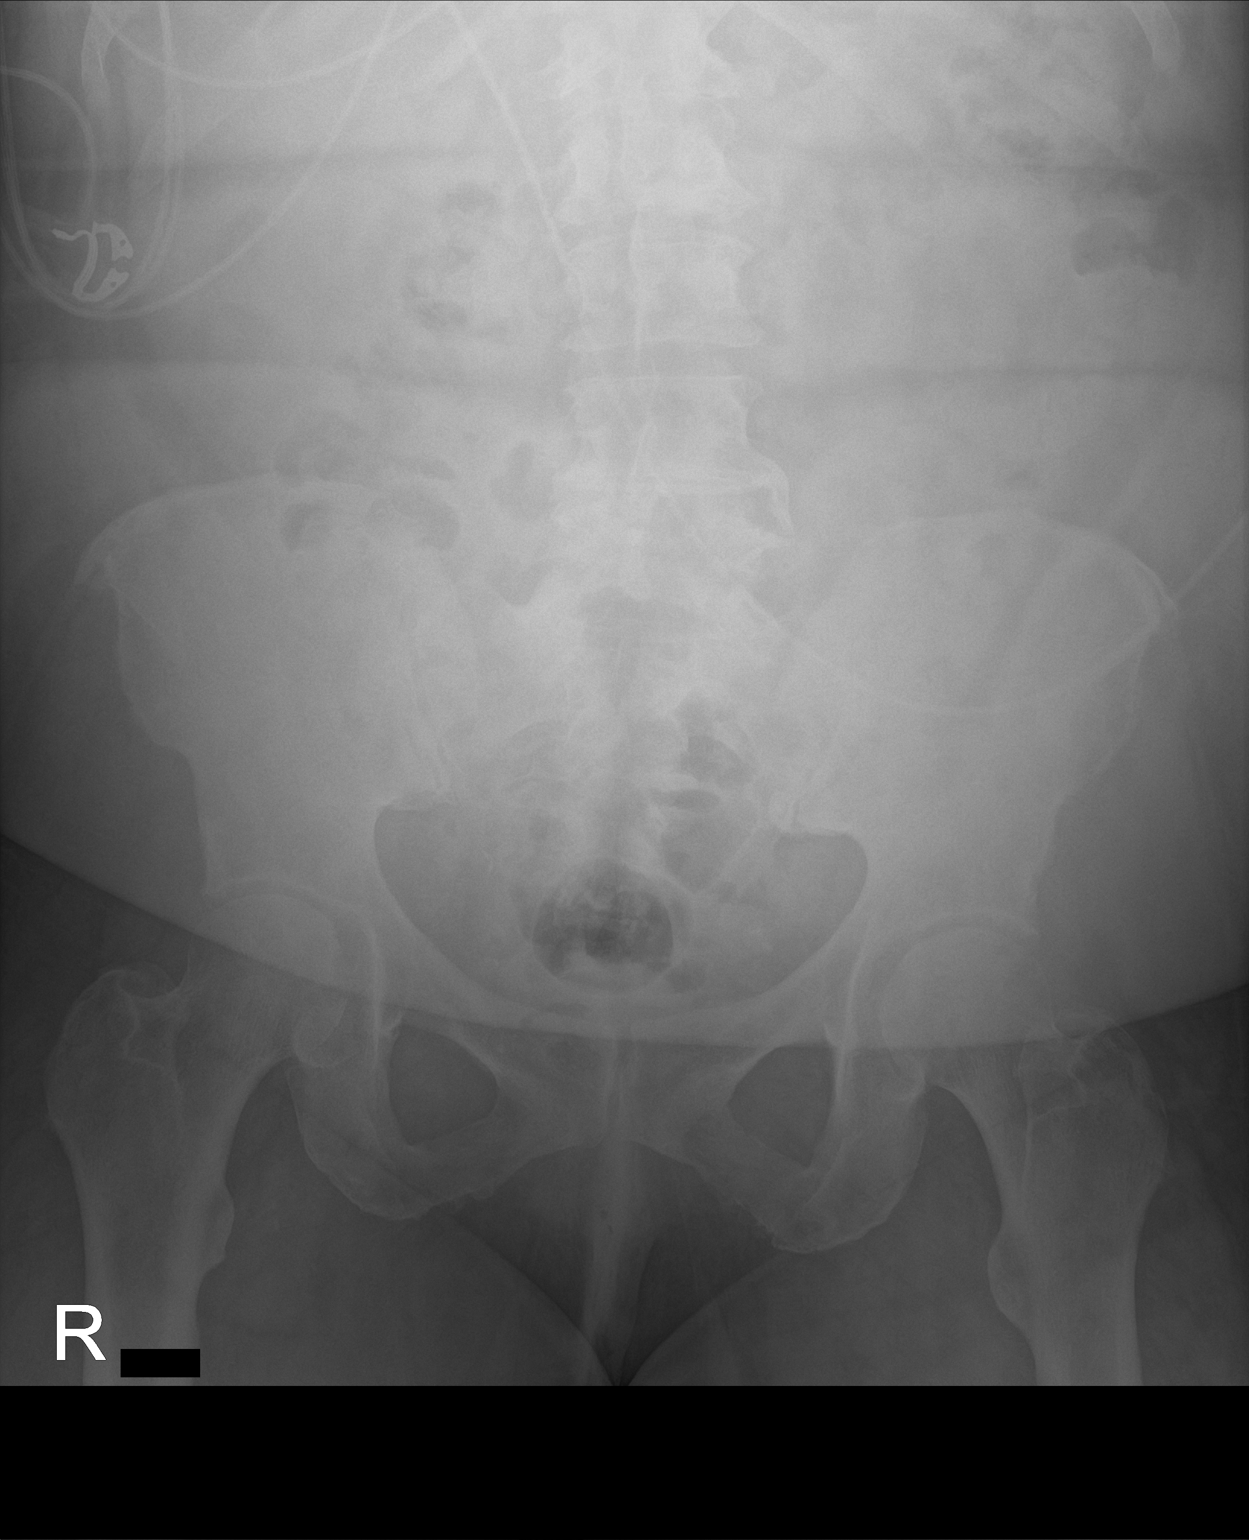

[abdomen kub (2 of 2)]
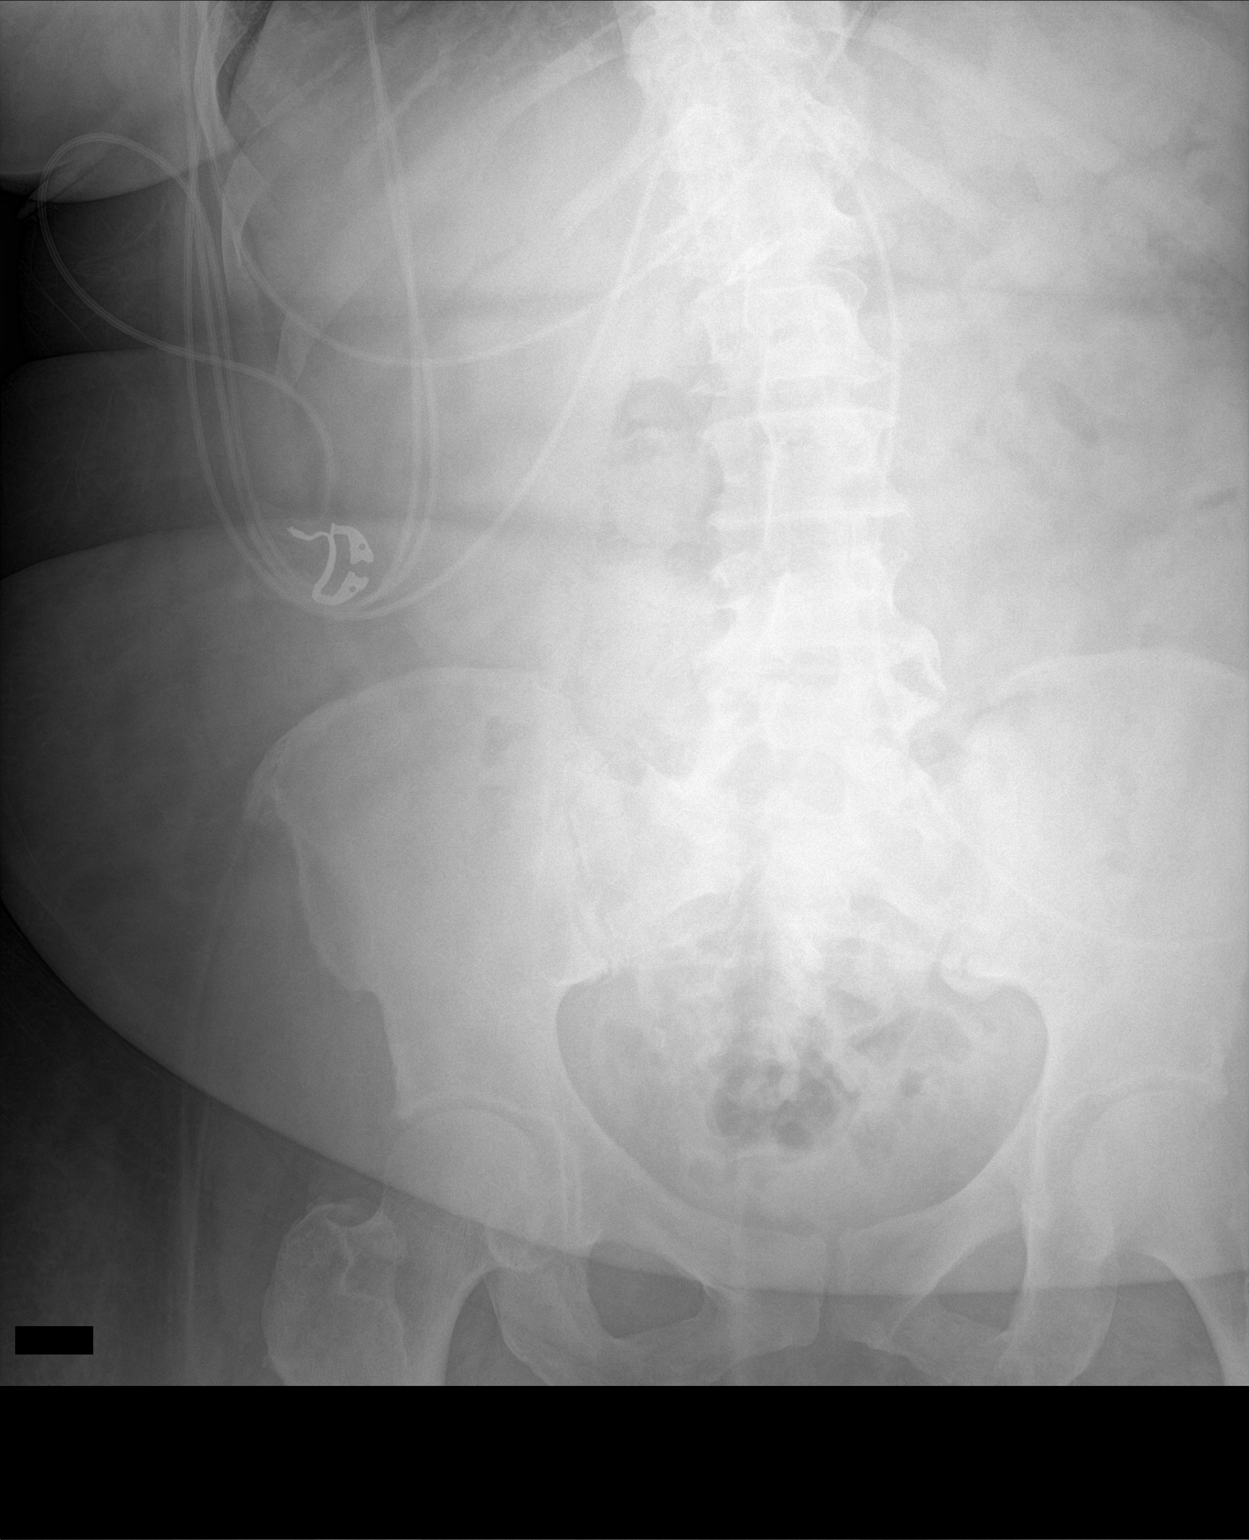

[2 of 2 positions shown; findings below may reference images not displayed]

FINDINGS: Normal bowel gas pattern.

No radiopaque foreign body.

No evidence of renal or ureteral stones.

No acute skeletal abnormality.
IMPRESSION: 1. No acute findings.  No evidence of bowel obstruction.
2. No radiopaque foreign body.

## 2019-09-20 DIAGNOSIS — M1990 Unspecified osteoarthritis, unspecified site: Secondary | ICD-10-CM | POA: Diagnosis not present

## 2019-09-21 DIAGNOSIS — M1990 Unspecified osteoarthritis, unspecified site: Secondary | ICD-10-CM | POA: Diagnosis not present

## 2019-09-22 DIAGNOSIS — M1990 Unspecified osteoarthritis, unspecified site: Secondary | ICD-10-CM | POA: Diagnosis not present

## 2019-09-22 NOTE — Progress Notes (Deleted)
[name]   [phone] [phone]   Clinic Follow up Note   Patient Care Team: [name], MD as PCP - General (Internal Medicine) 09/22/2019  CHIEF COMPLAINT: F/u HHT and anemia   CURRENT THERAPY:  CURRENT THERAPY: -Blood transfusion for severe anemia secondary to GI bleedingas needed -ivferric gluconate every1-2 weeks as neededwith ferritin goal 100-200. Increased to weekly on 07/29/19. -RestartedAvastin q2weeks on 04/03/18 -Amicar 1g bidstarting4/06/2018. Reduced to only as needed on 12/10/18 due toDVT  INTERVAL HISTORY: [name] returns for f/u and treatment as scheduled. She was last seen 4/15. She continues q2 weeks avastin and weekly iron in the interval.    REVIEW OF SYSTEMS:   Constitutional: Denies fevers, chills or abnormal weight loss Eyes: Denies blurriness of vision Ears, nose, mouth, throat, and face: Denies mucositis or sore throat Respiratory: Denies cough, dyspnea or wheezes Cardiovascular: Denies palpitation, chest discomfort or lower extremity swelling Gastrointestinal:  Denies nausea, heartburn or change in bowel habits Skin: Denies abnormal skin rashes Lymphatics: Denies new lymphadenopathy or easy bruising Neurological:Denies numbness, tingling or new weaknesses Behavioral/Psych: Mood is stable, no new changes  All other systems were reviewed with the patient and are negative.  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    SURGICAL HISTORY: Past Surgical History:  Procedure Laterality Date   ABDOMINAL HYSTERECTOMY     CARPAL  TUNNEL RELEASE  05/13/2011   Procedure: CARPAL TUNNEL RELEASE;  Surgeon: Nita Sells, MD;  Location: [place];  Service: Orthopedics;  Laterality: Left;   COLONOSCOPY WITH PROPOFOL N/A 04/28/2014   Procedure: COLONOSCOPY WITH PROPOFOL;  Surgeon: Cleotis Nipper, MD;  Location: [facility] ENDOSCOPY;  Service: Endoscopy;  Laterality: N/A;   DG TOES*L*  2/10   rt   DILATION AND CURETTAGE OF UTERUS     ENTEROSCOPY N/A 10/17/2017   Procedure: ENTEROSCOPY;  Surgeon: Otis Brace, MD;  Location: [place];  Service: Gastroenterology;  Laterality: N/A;   ESOPHAGOGASTRODUODENOSCOPY N/A 04/10/2014   Procedure: ESOPHAGOGASTRODUODENOSCOPY (EGD);  Surgeon: [name], MD;  Location[facility] ENDOSCOPY;  Service: Endoscopy;  Laterality: N/A;   ESOPHAGOGASTRODUODENOSCOPY N/A 05/10/2017   Procedure: ESOPHAGOGASTRODUODENOSCOPY (EGD);  Surgeon: Ronald Lobo, MD;  Location[facility] ENDOSCOPY;  Service: Endoscopy;  Laterality: N/A;   ESOPHAGOGASTRODUODENOSCOPY N/A 09/22/2017   Procedure: ESOPHAGOGASTRODUODENOSCOPY (EGD);  Surgeon: Clarene Essex, MD;  Location: [place];  Service: Endoscopy;  Laterality: N/A;  bedside   ESOPHAGOGASTRODUODENOSCOPY (EGD) WITH PROPOFOL N/A 04/27/2014   Procedure: ESOPHAGOGASTRODUODENOSCOPY (EGD) WITH PROPOFOL;  Surgeon: Cleotis Nipper, MD;  Location: [place];  Service: Endoscopy;  Laterality: N/A;  possible apc   ESOPHAGOGASTRODUODENOSCOPY (EGD) WITH PROPOFOL N/A 09/30/2017   Procedure: ESOPHAGOGASTRODUODENOSCOPY (EGD) WITH PROPOFOL;  Surgeon: [name], MD;  Location: [place];  Service: Gastroenterology;  Laterality: N/A;   ESOPHAGOGASTRODUODENOSCOPY (EGD) WITH PROPOFOL N/A 10/01/2017   Procedure: ESOPHAGOGASTRODUODENOSCOPY (EGD) WITH PROPOFOL;  Surgeon: [name], MD;  Location: [place];  Service: Gastroenterology;  Laterality: N/A;   ESOPHAGOGASTRODUODENOSCOPY (EGD) WITH PROPOFOL N/A 10/08/2017   Procedure:  ESOPHAGOGASTRODUODENOSCOPY (EGD) WITH PROPOFOL;  Surgeon: Otis Brace,  MD;  Location: [place];  Service: Gastroenterology;  Laterality: N/A;   ESOPHAGOGASTRODUODENOSCOPY (EGD) WITH PROPOFOL N/A 10/17/2017   Procedure: ESOPHAGOGASTRODUODENOSCOPY (EGD) WITH PROPOFOL;  Surgeon: Otis Brace, MD;  Location: [place];  Service: Gastroenterology;  Laterality: N/A;   ESOPHAGOGASTRODUODENOSCOPY (EGD) WITH PROPOFOL N/A 10/19/2017   Procedure: ESOPHAGOGASTRODUODENOSCOPY (EGD) WITH PROPOFOL;  Surgeon: Otis Brace, MD;  Location: [place];  Service: Gastroenterology;  Laterality: N/A;   ESOPHAGOGASTRODUODENOSCOPY (EGD) WITH PROPOFOL N/A 12/04/2018   Procedure: ESOPHAGOGASTRODUODENOSCOPY (EGD) WITH PROPOFOL;  Surgeon: Wilford Corner, MD;  Location: [facility] ENDOSCOPY;  Service: Endoscopy;  Laterality: N/A;   GIVENS CAPSULE STUDY N/A 10/02/2017   Procedure: GIVENS CAPSULE STUDY;  Surgeon: [name], MD;  Location: [place];  Service: Gastroenterology;  Laterality: N/A;   GIVENS CAPSULE STUDY N/A 10/08/2017   Procedure: GIVENS CAPSULE STUDY;  Surgeon: Otis Brace, MD;  Location: [place];  Service: Gastroenterology;  Laterality: N/A;  endoscopic placement of capsule   HEMOSTASIS CLIP PLACEMENT  12/04/2018   Procedure: HEMOSTASIS CLIP PLACEMENT;  Surgeon: Wilford Corner, MD;  Location: [facility] ENDOSCOPY;  Service: Endoscopy;;   HOT HEMOSTASIS N/A 04/27/2014   Procedure: HOT HEMOSTASIS (ARGON PLASMA COAGULATION/BICAP);  Surgeon: Cleotis Nipper, MD;  Location: [facility] ENDOSCOPY;  Service: Endoscopy;  Laterality: N/A;   HOT HEMOSTASIS N/A 09/30/2017   Procedure: HOT HEMOSTASIS (ARGON PLASMA COAGULATION/BICAP);  Surgeon: [name], MD;  Location: [place];  Service: Gastroenterology;  Laterality: N/A;   HOT HEMOSTASIS N/A 10/01/2017   Procedure: HOT HEMOSTASIS (ARGON PLASMA COAGULATION/BICAP);  Surgeon: [name], MD;  Location: [place];  Service: Gastroenterology;   Laterality: N/A;   HOT HEMOSTASIS N/A 10/17/2017   Procedure: HOT HEMOSTASIS (ARGON PLASMA COAGULATION/BICAP);  Surgeon: Otis Brace, MD;  Location: [facility] ENDOSCOPY;  Service: Gastroenterology;  Laterality: N/A;   HOT HEMOSTASIS N/A 10/19/2017   Procedure: HOT HEMOSTASIS (ARGON PLASMA COAGULATION/BICAP);  Surgeon: Otis Brace, MD;  Location: [facility] ENDOSCOPY;  Service: Gastroenterology;  Laterality: N/A;   IR IMAGING GUIDED PORT INSERTION  07/08/2018   L shoulder Surgery  2011   SUBMUCOSAL INJECTION  09/22/2017   Procedure: SUBMUCOSAL INJECTION;  Surgeon: Clarene Essex, MD;  Location: [facility] ENDOSCOPY;  Service: Endoscopy;;   SUBMUCOSAL INJECTION  12/04/2018   Procedure: SUBMUCOSAL INJECTION;  Surgeon: Wilford Corner, MD;  Location: [facility] ENDOSCOPY;  Service: Endoscopy;;    I have reviewed the social history and family history with the patient and they are unchanged from previous note.  ALLERGIES:  is allergic to feraheme [ferumoxytol]; nsaids; tomato; and wasp venom.  MEDICATIONS:  Current Outpatient Medications  Medication Sig Dispense Refill   albuterol (PROVENTIL HFA;VENTOLIN HFA) 108 (90 Base) MCG/ACT inhaler Inhale 1-2 puffs into the lungs every 6 (six) hours as needed for wheezing or shortness of breath. (Patient not taking: Reported on 09/18/2019) 1 Inhaler 3   Aminocaproic Acid 1000 MG TABS TAKE 1 TABLET(1000 MG) BY MOUTH TWICE DAILY (Patient taking differently: Take 1,000 mg by mouth. ) 60 tablet 1   amLODipine (NORVASC) 10 MG tablet TAKE 1 TABLET(10 MG) BY MOUTH DAILY 90 tablet 0   atorvastatin (LIPITOR) 80 MG tablet TAKE 1 TABLET(80 MG) BY MOUTH DAILY (Patient taking differently: Take 80 mg by mouth daily. ) 30 tablet 3   cetirizine (ZYRTEC) 10 MG tablet TAKE 1 TABLET(10 MG) BY MOUTH DAILY (Patient taking differently: Take 10 mg by mouth daily. ) 90 tablet 1   cyclobenzaprine (FLEXERIL) 10 MG tablet Take 1 tablet (10 mg total) by mouth 2 (two) times daily as needed for  up to 20  doses for muscle spasms. 20 tablet 0   diphenhydramine-acetaminophen (TYLENOL PM) 25-500 MG TABS tablet Take 1 tablet by mouth at bedtime as needed (sleep).     diphenoxylate-atropine (LOMOTIL) 2.5-0.025 MG tablet 1 to 2 PO QID prn diarrhea 30 tablet 1   hydrochlorothiazide (HYDRODIURIL) 12.5 MG tablet TAKE 1 TABLET(12.5 MG) BY MOUTH DAILY (Patient taking differently: Take 12.5 mg by mouth daily. ) 90 tablet 1   lidocaine-prilocaine (EMLA) cream Apply 1 application topically as needed. 30 g 0   metFORMIN (GLUCOPHAGE) 500 MG tablet TAKE 1 TABLET(500 MG) BY MOUTH TWICE DAILY WITH A MEAL (Patient taking differently: Take 500 mg by mouth 2 (two) times daily with a meal. ) 180 tablet 1   pantoprazole (PROTONIX) 40 MG tablet Take 1 tablet (40 mg total) by mouth 2 (two) times daily. 60 tablet 5   Podiatric Products (FLEXITOL HEEL BALM) OINT Apply topically.     potassium chloride (KLOR-CON) 20 MEQ packet Take 20 mEq by mouth daily. 15 packet 0   prochlorperazine (COMPAZINE) 10 MG tablet Take 1 tablet (10 mg total) by mouth every 6 (six) hours as needed for nausea or vomiting. 30 tablet 1   sertraline (ZOLOFT) 100 MG tablet TAKE 1 TABLET(100 MG) BY MOUTH DAILY (Patient taking differently: Take 100 mg by mouth daily. ) 90 tablet 1   traZODone (DESYREL) 50 MG tablet TAKE 1 TABLET(50 MG) BY MOUTH AT BEDTIME (Patient taking differently: Take 50 mg by mouth at bedtime. ) 30 tablet 2   No current facility-administered medications for this visit.    PHYSICAL EXAMINATION: ECOG PERFORMANCE STATUS: {CHL ONC ECOG PS:[id number]}  There were no vitals filed for this visit. There were no vitals filed for this visit.  GENERAL:alert, no distress and comfortable SKIN: skin color, texture, turgor are normal, no rashes or significant lesions EYES: normal, Conjunctiva are pink and non-injected, sclera clear OROPHARYNX:no exudate, no erythema and lips, buccal mucosa, and tongue normal  NECK: supple, thyroid  normal size, non-tender, without nodularity LYMPH:  no palpable lymphadenopathy in the cervical, axillary or inguinal LUNGS: clear to auscultation and percussion with normal breathing effort HEART: regular rate & rhythm and no murmurs and no lower extremity edema ABDOMEN:abdomen soft, non-tender and normal bowel sounds Musculoskeletal:no cyanosis of digits and no clubbing  NEURO: alert & oriented x 3 with fluent speech, no focal motor/sensory deficits  LABORATORY DATA:  I have reviewed the data as listed CBC Latest Ref Rng & Units 09/17/2019 09/16/2019 09/09/2019  WBC 4.0 - 10.5 K/uL 13.7(H) 13.4(H) 13.9(H)  Hemoglobin 12.0 - 15.0 g/dL 10.0(L) 9.5(L) 9.4(L)  Hematocrit 36.0 - 46.0 % 34.7(L) 32.4(L) 32.7(L)  Platelets 150 - 400 K/uL 289 270 316     CMP Latest Ref Rng & Units 09/17/2019 09/09/2019 08/26/2019  Glucose 70 - 99 mg/dL 110(H) 101(H) 98  BUN 6 - 20 mg/dL 5(L) 8 8  Creatinine 0.44 - 1.00 mg/dL 0.69 0.72 0.83  Sodium 135 - 145 mmol/L 140 142 142  Potassium 3.5 - 5.1 mmol/L 3.0(L) 3.4(L) 3.5  Chloride 98 - 111 mmol/L 102 103 106  CO2 22 - 32 mmol/L 24 27 26   Calcium 8.9 - 10.3 mg/dL 9.7 9.5 9.6  Total Protein 6.5 - 8.1 g/dL - 8.1 8.1  Total Bilirubin 0.3 - 1.2 mg/dL - 0.5 0.3  Alkaline Phos 38 - 126 U/L - 69 85  AST 15 - 41 U/L - 20 16  ALT 0 - 44 U/L - 14 9  RADIOGRAPHIC STUDIES: I have personally reviewed the radiological images as listed and agreed with the findings in the report. No results found.   ASSESSMENT & PLAN:  No problem-specific Assessment & Plan notes found for this encounter.   No orders of the defined types were placed in this encounter.  All questions were answered. The patient knows to call the clinic with any problems, questions or concerns. No barriers to learning was detected. I spent {CHL ONC TIME VISIT - HKISN:[id number]} counseling the patient face to face. The total time spent in the appointment was {CHL ONC TIME VISIT - GJGML:[id number]} and  more than 50% was on counseling and review of test results     Alla Feeling, NP 09/22/19

## 2019-09-23 ENCOUNTER — Inpatient Hospital Stay: Payer: Medicaid Other

## 2019-09-23 ENCOUNTER — Inpatient Hospital Stay: Payer: Medicaid Other | Admitting: Nurse Practitioner

## 2019-09-23 DIAGNOSIS — M1990 Unspecified osteoarthritis, unspecified site: Secondary | ICD-10-CM | POA: Diagnosis not present

## 2019-09-24 DIAGNOSIS — M1990 Unspecified osteoarthritis, unspecified site: Secondary | ICD-10-CM | POA: Diagnosis not present

## 2019-09-27 ENCOUNTER — Encounter: Payer: Self-pay | Admitting: Dietician

## 2019-09-27 ENCOUNTER — Encounter: Payer: Self-pay | Admitting: Internal Medicine

## 2019-09-27 ENCOUNTER — Ambulatory Visit: Payer: Medicaid Other | Admitting: Dietician

## 2019-09-27 ENCOUNTER — Ambulatory Visit: Payer: Medicaid Other | Admitting: Internal Medicine

## 2019-09-27 VITALS — BP 160/82 | HR 88 | Temp 99.2°F | Ht 65.0 in | Wt 262.8 lb

## 2019-09-27 DIAGNOSIS — M79605 Pain in left leg: Secondary | ICD-10-CM

## 2019-09-27 DIAGNOSIS — M1990 Unspecified osteoarthritis, unspecified site: Secondary | ICD-10-CM | POA: Diagnosis not present

## 2019-09-27 DIAGNOSIS — E1159 Type 2 diabetes mellitus with other circulatory complications: Secondary | ICD-10-CM

## 2019-09-27 DIAGNOSIS — I780 Hereditary hemorrhagic telangiectasia: Secondary | ICD-10-CM | POA: Diagnosis not present

## 2019-09-27 DIAGNOSIS — I10 Essential (primary) hypertension: Secondary | ICD-10-CM

## 2019-09-27 DIAGNOSIS — N3941 Urge incontinence: Secondary | ICD-10-CM | POA: Diagnosis not present

## 2019-09-27 DIAGNOSIS — M79604 Pain in right leg: Secondary | ICD-10-CM

## 2019-09-27 LAB — POCT GLYCOSYLATED HEMOGLOBIN (HGB A1C): Hemoglobin A1C: 5.6 % (ref 4.0–5.6)

## 2019-09-27 LAB — GLUCOSE, CAPILLARY: Glucose-Capillary: 94 mg/dL (ref 70–99)

## 2019-09-27 MED ORDER — PREDNISONE 10 MG PO TABS: ORAL_TABLET | ORAL | 0 refills | Status: AC

## 2019-09-27 MED ORDER — ACCU-CHEK GUIDE VI STRP: ORAL_STRIP | 3 refills | Status: DC

## 2019-09-27 MED ORDER — ACCU-CHEK SOFTCLIX LANCETS MISC: 1 refills | Status: DC

## 2019-09-27 NOTE — Progress Notes (Signed)
Diabetes Self-Management Education  Visit Type: First/Initial  Appt. Start Time: 1655 Appt. End Time: 1638  09/27/2019  Ms. Peri Maris, identified by name and date of birth, is a 59 y.o. female with a diagnosis of Diabetes: Type 2.   ASSESSMENT  There were no vitals taken for this visit. There is no height or weight on file to calculate BMI.   Diabetes Self-Management Education - 09/27/19 1700      Visit Information   Visit Type First/Initial      Initial Visit   Diabetes Type Type 2      Pre-Education Assessment   Patient understands monitoring blood glucose, interpreting and using results Needs Instruction      Complications   Last HgB A1C per patient/outside source 5.6 %    How often do you check your blood sugar? Not recommended by provider   is now beign recommedned due to steroid taper     Patient Education   Monitoring Taught/evaluated SMBG meter.   gave an accu chek guide me meter     Post-Education Assessment   Patient understands monitoring blood glucose, interpreting and using results Demonstrates understanding / competency      Outcomes   Expected Outcomes Demonstrated interest in learning. Expect positive outcomes    Future DMSE 3-4 months    Program Status Completed           Individualized Plan for Diabetes Self-Management Training:   Learning Objective:  Patient will have a greater understanding of diabetes self-management. Patient education plan is to attend individual and/or group sessions per assessed needs and concerns.   Plan:   There are no Patient Instructions on file for this visit.  Expected Outcomes:  Demonstrated interest in learning. Expect positive outcomes  Education material provided: Diabetes Resources  If problems or questions, patient to contact team via:  Phone  Future DSME appointment: 3-4 months  Debera Lat, RD 09/27/2019 6:00 PM.

## 2019-09-27 NOTE — Patient Instructions (Addendum)
[name],  It was a pleasure to see you today. Thank you for coming in.   Today we discussed your foot pain. This could be a gout flare up. Please start taking the prednisone taper.  We also discussed your diabetes. Please continue taking metformin. Your A1c looks great today!   Your blood pressure is a little high today but this may be due to the pain. Please continue your blood pressure medications.   Please return to clinic in 1 month or sooner if needed.   Thank you again for coming in.   [name].D.

## 2019-09-27 NOTE — Progress Notes (Signed)
[place]   Telephone:(336) [phone] Fax:(336) [phone]   Clinic Follow up Note   Patient Care Team: [name], MD as PCP - General (Internal Medicine)  Date of Service:  09/29/2019  CHIEF COMPLAINT: F/uof HHT andAnemia   PREVIOUS THERAPY:  -Multiple EGD, small bowel enteroscope, C-scope with APC -Oral and iv amicar were given during hospital stay, no effect -IR embolization ofof the left gastric and short gastric artery branch vessels without evidence of residual flow in the Dieulafoy lesionon 12/10/17 -Avastin on 8/2 and 8/30 at [facility]; starting 12/26/17 continue 5mg /kg q2 weeksuntil 02/20/18, restarted on 04/03/2018 -Tamoxifen started in 10/2017 for HHTstopped 12/10/18 due toDVT  CURRENT THERAPY: -Blood transfusion for severe anemia secondary to GI bleedingas needed -ivferric gluconate every1-2 weeks as neededwith ferritin goal 100-200. Increased to weekly on 07/29/19. -RestartedAvastin q2weeks on 04/03/18 -Amicar 1g bidstarting4/06/2018. Reduced to only as needed on 12/10/18 due toDVT   INTERVAL HISTORY:  Peggy House is here for a follow up of HHT and Anemia. She presents to the clinic alone. She notes she recently went to PCP for her b/l swelling and pain. This has been going on for the last 3 weeks. She notes its sore when touched. Her PCP notes this may be gout of her left ankle. She notes it hurts when she walks and she will need to take breaks.  She notes feeling stressed and having panic attacks since last week. She notes she has not been sleeping well. Her husband has cancer getting treatment in our clinic and her son has DM who needs kidney transplant.   REVIEW OF SYSTEMS:   Constitutional: Denies fevers, chills or abnormal weight loss Eyes: Denies blurriness of vision Ears, nose, mouth, throat, and face: Denies mucositis or sore throat Respiratory: Denies cough, dyspnea or wheezes Cardiovascular: Denies palpitation, chest  discomfort (+) b/l lower extremity swelling with pain  Gastrointestinal:  Denies nausea, heartburn or change in bowel habits Skin: Denies abnormal skin rashes Lymphatics: Denies new lymphadenopathy or easy bruising Neurological:Denies numbness, tingling or new weaknesses Behavioral/Psych: Mood is stable, no new changes (+) Anxiety and panic attacks  All other systems were reviewed with the patient and are negative.  MEDICAL HISTORY:  Past Medical History:  Diagnosis Date  . Anxiety   . Arthritis    knnes,back  . GERD (gastroesophageal reflux disease)   . 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 History of swelling of feet   . Hyperlipidemia   . Hypertension   . Major depressive disorder, recurrent episode ([place]) 06/05/2015  . Obesity   . Snores   . 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 ABDOMINAL HYSTERECTOMY    . CARPAL TUNNEL RELEASE  05/13/2011   Procedure: CARPAL TUNNEL RELEASE;  Surgeon: Nita Sells, MD;  Location: [place];  Service: Orthopedics;  Laterality: Left;  . COLONOSCOPY WITH PROPOFOL N/A 04/28/2014   Procedure: COLONOSCOPY WITH PROPOFOL;  Surgeon: Cleotis Nipper, MD;  Location: [facility] ENDOSCOPY;  Service: Endoscopy;  Laterality: N/A;  . DG TOES*L*  2/10   rt  . DILATION AND CURETTAGE OF UTERUS    . ENTEROSCOPY N/A 10/17/2017   Procedure: ENTEROSCOPY;  Surgeon: Otis Brace, MD;  Location: [place] ENDOSCOPY;  Service: Gastroenterology;  Laterality: N/A;  . ESOPHAGOGASTRODUODENOSCOPY N/A 04/10/2014   Procedure: ESOPHAGOGASTRODUODENOSCOPY (EGD);  Surgeon: [name], MD;  Location: [facility] ENDOSCOPY;  Service: Endoscopy;  Laterality: N/A;  . ESOPHAGOGASTRODUODENOSCOPY N/A 05/10/2017   Procedure: ESOPHAGOGASTRODUODENOSCOPY (EGD);  Surgeon: Ronald Lobo, MD;  Location: [facility] ENDOSCOPY;  Service: Endoscopy;  Laterality: N/A;  .  ESOPHAGOGASTRODUODENOSCOPY N/A 09/22/2017   Procedure: ESOPHAGOGASTRODUODENOSCOPY (EGD);  Surgeon: Clarene Essex, MD;  Location: [place];  Service: Endoscopy;  Laterality: N/A;  bedside  . ESOPHAGOGASTRODUODENOSCOPY (EGD) WITH PROPOFOL N/A 04/27/2014   Procedure: ESOPHAGOGASTRODUODENOSCOPY (EGD) WITH PROPOFOL;  Surgeon: Cleotis Nipper, MD;  Location: [place];  Service: Endoscopy;  Laterality: N/A;  possible apc  . ESOPHAGOGASTRODUODENOSCOPY (EGD) WITH PROPOFOL N/A 09/30/2017   Procedure: ESOPHAGOGASTRODUODENOSCOPY (EGD) WITH PROPOFOL;  Surgeon: [name], MD;  Location: [place];  Service: Gastroenterology;  Laterality: N/A;  . ESOPHAGOGASTRODUODENOSCOPY (EGD) WITH PROPOFOL N/A 10/01/2017   Procedure: ESOPHAGOGASTRODUODENOSCOPY (EGD) WITH PROPOFOL;  Surgeon: [name], MD;  Location: [place];  Service: Gastroenterology;  Laterality: N/A;  . ESOPHAGOGASTRODUODENOSCOPY (EGD) WITH PROPOFOL N/A 10/08/2017   Procedure: ESOPHAGOGASTRODUODENOSCOPY (EGD) WITH PROPOFOL;  Surgeon: Otis Brace, MD;  Location: [facility] ENDOSCOPY;  Service: Gastroenterology;  Laterality: N/A;  . ESOPHAGOGASTRODUODENOSCOPY (EGD) WITH PROPOFOL N/A 10/17/2017   Procedure: ESOPHAGOGASTRODUODENOSCOPY (EGD) WITH PROPOFOL;  Surgeon: Otis Brace, MD;  Location: [facility] ENDOSCOPY;  Service: Gastroenterology;  Laterality: N/A;  . ESOPHAGOGASTRODUODENOSCOPY (EGD) WITH PROPOFOL N/A 10/19/2017   Procedure: ESOPHAGOGASTRODUODENOSCOPY (EGD) WITH PROPOFOL;  Surgeon: Otis Brace, MD;  Location: [facility] ENDOSCOPY;  Service: Gastroenterology;  Laterality: N/A;  . ESOPHAGOGASTRODUODENOSCOPY (EGD) WITH PROPOFOL N/A 12/04/2018   Procedure: ESOPHAGOGASTRODUODENOSCOPY (EGD) WITH PROPOFOL;  Surgeon: Wilford Corner, MD;  Location: [facility] ENDOSCOPY;  Service: Endoscopy;  Laterality: N/A;  . GIVENS CAPSULE STUDY N/A 10/02/2017   Procedure: GIVENS CAPSULE STUDY;  Surgeon: [name], MD;  Location: [place];  Service: Gastroenterology;   Laterality: N/A;  . GIVENS CAPSULE STUDY N/A 10/08/2017   Procedure: GIVENS CAPSULE STUDY;  Surgeon: Otis Brace, MD;  Location: [place];  Service: Gastroenterology;  Laterality: N/A;  endoscopic placement of capsule  . HEMOSTASIS CLIP PLACEMENT  12/04/2018   Procedure: HEMOSTASIS CLIP PLACEMENT;  Surgeon: Wilford Corner, MD;  Location: [facility] ENDOSCOPY;  Service: Endoscopy;;  . HOT HEMOSTASIS N/A 04/27/2014   Procedure: HOT HEMOSTASIS (ARGON PLASMA COAGULATION/BICAP);  Surgeon: Cleotis Nipper, MD;  Location: [facility] ENDOSCOPY;  Service: Endoscopy;  Laterality: N/A;  . HOT HEMOSTASIS N/A 09/30/2017   Procedure: HOT HEMOSTASIS (ARGON PLASMA COAGULATION/BICAP);  Surgeon: [name], MD;  Location: [place];  Service: Gastroenterology;  Laterality: N/A;  . HOT HEMOSTASIS N/A 10/01/2017   Procedure: HOT HEMOSTASIS (ARGON PLASMA COAGULATION/BICAP);  Surgeon: [name], MD;  Location: [place];  Service: Gastroenterology;  Laterality: N/A;  . HOT HEMOSTASIS N/A 10/17/2017   Procedure: HOT HEMOSTASIS (ARGON PLASMA COAGULATION/BICAP);  Surgeon: Otis Brace, MD;  Location: [facility] ENDOSCOPY;  Service: Gastroenterology;  Laterality: N/A;  . HOT HEMOSTASIS N/A 10/19/2017   Procedure: HOT HEMOSTASIS (ARGON PLASMA COAGULATION/BICAP);  Surgeon: Otis Brace, MD;  Location: [facility] ENDOSCOPY;  Service: Gastroenterology;  Laterality: N/A;  . IR IMAGING GUIDED PORT INSERTION  07/08/2018  . L shoulder Surgery  2011  . SUBMUCOSAL INJECTION  09/22/2017   Procedure: SUBMUCOSAL INJECTION;  Surgeon: Clarene Essex, MD;  Location: [facility] ENDOSCOPY;  Service: Endoscopy;;  . SUBMUCOSAL INJECTION  12/04/2018   Procedure: SUBMUCOSAL INJECTION;  Surgeon: Wilford Corner, MD;  Location: [facility] ENDOSCOPY;  Service: Endoscopy;;    I have reviewed the social history and family history with the patient and they are unchanged from previous note.  ALLERGIES:  is allergic to feraheme [ferumoxytol], nsaids, tomato, and wasp  venom.  MEDICATIONS:  Current Outpatient Medications  Medication Sig Dispense Refill  . Accu-Chek Softclix Lancets lancets Use to check blood sugar before breakfast and before dinner while on steroids 100 each 1  . albuterol (PROVENTIL HFA;VENTOLIN HFA) 108 (90 Base) MCG/ACT inhaler Inhale 1-2 puffs into the lungs every 6 (six) hours as needed for wheezing or shortness of breath. (Patient not taking: Reported on 09/18/2019) 1 Inhaler 3  . ALPRAZolam (XANAX) 0.5 MG tablet Take 1 tablet (0.5 mg total) by mouth at bedtime as needed for anxiety. 30 tablet 0  . Aminocaproic Acid 1000 MG TABS TAKE 1 TABLET(1000 MG) BY MOUTH TWICE DAILY (Patient taking differently: Take 1,000 mg by mouth. ) 60 tablet 1  . amLODipine (NORVASC) 10 MG tablet TAKE 1 TABLET(10 MG) BY MOUTH DAILY 90 tablet 0  . atorvastatin (LIPITOR) 80 MG tablet TAKE 1 TABLET(80 MG) BY MOUTH DAILY (Patient taking differently: Take 80 mg by mouth daily. ) 30 tablet 3  . cetirizine (ZYRTEC) 10 MG tablet TAKE 1 TABLET(10 MG) BY MOUTH DAILY (Patient taking differently: Take 10 mg by mouth daily. ) 90 tablet 1  . cyclobenzaprine (FLEXERIL) 10 MG tablet Take 1 tablet (10 mg total) by mouth 2 (two) times daily as needed for up to 20 doses for muscle spasms. 20 tablet 0  . diphenhydramine-acetaminophen (TYLENOL PM) 25-500 MG TABS tablet Take 1 tablet by mouth at bedtime as needed (sleep).    . diphenoxylate-atropine (LOMOTIL) 2.5-0.025 MG tablet 1 to 2 PO QID prn diarrhea 30 tablet 1  . glucose blood (ACCU-CHEK GUIDE) test strip Check blood sugar 2 times per day while on steroids before breakfast and dinner 50 each 3  . hydrochlorothiazide (HYDRODIURIL) 12.5 MG tablet TAKE 1 TABLET(12.5 MG) BY MOUTH DAILY (Patient taking differently: Take 12.5 mg by mouth daily. ) 90 tablet 1  . lidocaine-prilocaine (EMLA) cream Apply 1 application topically as needed. 30 g 0  . metFORMIN (GLUCOPHAGE) 500 MG tablet TAKE 1 TABLET(500 MG) BY MOUTH TWICE DAILY WITH A MEAL  (Patient taking differently: Take 500 mg by mouth 2 (two) times daily with a meal. ) 180 tablet 1  . pantoprazole (PROTONIX) 40 MG tablet Take 1 tablet (40 mg total) by mouth 2 (two) times daily. 60 tablet 5  . Podiatric Products (FLEXITOL HEEL BALM) OINT Apply topically.    . potassium chloride (KLOR-CON) 20 MEQ packet Take 20 mEq by mouth daily. 15 packet 0  . predniSONE (DELTASONE) 10 MG tablet Take 4 tablets (40 mg total) by mouth daily with breakfast for 5 days, THEN 2 tablets (20 mg total) daily with breakfast for 3 days, THEN 1 tablet (10 mg total) daily with breakfast for 3 days, THEN 0.5 tablets (5 mg total) daily with breakfast for 3 days. 31 tablet 0  . prochlorperazine (COMPAZINE) 10 MG tablet Take 1 tablet (10 mg total) by mouth every 6 (six) hours as needed for nausea or vomiting. 30 tablet 1  . sertraline (ZOLOFT) 100 MG tablet TAKE 1 TABLET(100 MG) BY MOUTH DAILY (Patient taking differently: Take 100 mg by mouth daily. ) 90 tablet 1  . traZODone (DESYREL) 50 MG tablet TAKE 1 TABLET(50 MG) BY MOUTH AT BEDTIME (Patient taking differently: Take 50 mg by mouth at bedtime. ) 30 tablet 2   No current facility-administered medications for this visit.    PHYSICAL EXAMINATION: ECOG PERFORMANCE STATUS: 2 - Symptomatic, <50% confined to bed Blood pressure 171/81, heart rate of 73, respirate 18, temperature 37, pulse ox 100% GENERAL:alert,  no distress and comfortable SKIN: skin color, texture, turgor are normal, no rashes or significant lesions EYES: normal, Conjunctiva are pink and non-injected, sclera clear  NECK: supple, thyroid normal size, non-tender, without nodularity LYMPH:  no palpable lymphadenopathy in the cervical, axillary  LUNGS: clear to auscultation and percussion with normal breathing effort HEART: regular rate & rhythm and no murmurs (+) B/l lower extremity edema (L>R), with tenderness and skin darkening of her ankles to lower calf.  ABDOMEN:abdomen soft, non-tender and  normal bowel sounds Musculoskeletal:no cyanosis of digits and no clubbing  NEURO: alert & oriented x 3 with fluent speech, no focal motor/sensory deficits  LABORATORY DATA:  I have reviewed the data as listed CBC Latest Ref Rng & Units 09/29/2019 09/17/2019 09/16/2019  WBC 4.0 - 10.5 K/uL 20.9(H) 13.7(H) 13.4(H)  Hemoglobin 12.0 - 15.0 g/dL 9.9(L) 10.0(L) 9.5(L)  Hematocrit 36 - 46 % 33.9(L) 34.7(L) 32.4(L)  Platelets 150 - 400 K/uL 310 289 270     CMP Latest Ref Rng & Units 09/17/2019 09/09/2019 08/26/2019  Glucose 70 - 99 mg/dL 110(H) 101(H) 98  BUN 6 - 20 mg/dL 5(L) 8 8  Creatinine 0.44 - 1.00 mg/dL 0.69 0.72 0.83  Sodium 135 - 145 mmol/L 140 142 142  Potassium 3.5 - 5.1 mmol/L 3.0(L) 3.4(L) 3.5  Chloride 98 - 111 mmol/L 102 103 106  CO2 22 - 32 mmol/L 24 27 26   Calcium 8.9 - 10.3 mg/dL 9.7 9.5 9.6  Total Protein 6.5 - 8.1 g/dL - 8.1 8.1  Total Bilirubin 0.3 - 1.2 mg/dL - 0.5 0.3  Alkaline Phos 38 - 126 U/L - 69 85  AST 15 - 41 U/L - 20 16  ALT 0 - 44 U/L - 14 9      RADIOGRAPHIC STUDIES: I have personally reviewed the radiological images as listed and agreed with the findings in the report. No results found.   ASSESSMENT & PLAN:  Peggy House is a 59 y.o. female with    1. 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 Extensive workup found the pt to have HHT based on her personal and familyhistoryand recurrentAVM GI bleedings. -Sheis s/pIR embolization of the left Gastric and short gastric artery branchvesselswithout evidence of residual flow in the Dieulafoy lesionon 12/10/2017. -[facility]here[facility]Av[facility] and then every 2 weeks9/13/19-11/8/19 -Shewas treated with Tamoxifen and Amicar for her HHT, but due to recent DVT, tamoxifen was stopped and she only takes  Amicar as needed for severe epistaxis. -Her primary treatment will now be Avastin and IV Iron ( with Ferric Gluconate weekly unless Ferritin >200)and blood transfusions as needed.We repeatedly discussedconsistency in treatments to manage her blood counts, she voices good understanding. -Labs reviewed, WBC increased to 20.9, Hg 9.9, ANC 19. She will proceed with Ferric Gluconate today. Will give Avastin based on urine protein.  -Continue weekly Ferric Gluconate unless Ferritin >200. 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 -Although she has not had recent GI bleeding she still has occasional moderate epistaxis.  -Hg at 9.9 today, Ferritin still pending (09/29/19). Proceed  with IV Ferric Gluconate today  -continue Prenatal Vitamin..   3. Reactive Leukocytosis and reactive thrombocytosis  -She has mild leukocytosis with dominant neutrophils, likely reactive -Thrombocytosis recently resolved. Leukocytosis intermittent. Continue monitoring. -WBC and ANC slightly increased, mostly stable. Platelets normal.   4. 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 -I recommend she continue to follow up with her GI, Dr. Cristina Gong -She had severe GI bleeding which required hospitalization in 11/2018 with several blood transfusions and EGD to cauterize her stomach bleeding. -I started heron Amicar 07/17/18. Given DVT she will only take Amicar for severe epistaxis -Her GI bleeding has resolved currently.But has been havingoccasional moderate epistaxis.   5.History of right  Arm DVT -She had right arm DVT that extended to her right clavicleon 12/07/2018 when she was hospitalized for GI bleeding.It is possibly related to Amicar, tamoxifen and port. -She is not a candidate for anticoagulation due to severe GI bleeding -We stopped Tamoxifen, she will only use Amicar for severe epistaxis. Will maintain Port flushes.  6. Smoking cessation -She was smoking 10 cigarettes a day on average before recent hospitalization -I strongly encouraged her to stop smoking completely, she will try.  7. Hypokalemia -Onpotassium chloride 20 mEq daily, continue. Willmonitor -K normal recently.   8. Hyperglycemia, new onset diabetes -Ipreviouslyadvised her to stop eating sweats and reduce her carbohydrate intake.  -Istarted her onMetformin BIDon11/5/20 to manage this.Continue to f/u with PCP to monitor herA1c and hyperglycemia. Has improved lately.   9. HTN, uncontrolled -Her BP has been intermittently elevated in clinic lately.  -I discussed Avastin can increase her BP. Will continue to monitor.  -I strongly encouraged her to keep up with her HTN medications daily to manage this. She understands.  10. Anxiety  -She has been stressed with family health issues (her husband's cancer and son's DM on transplant list). She has been more anxious and started having panic attacks last week. I will refill her Xanax at 0.5mg  dose to use only as needed.  -I also discussed her speaking with SW for counseling, she is agreeable.   11. B/l LE Edema  -For the past 3 weeks she has had b/l LE edema, L>R with tenderness and pain. She has skin darkening of her ankles to lower calf.  -She was recently seen by PCP who considered this to be related to gout, per pt.  -I recommend Doppler of Left LE to evaluate for blood clot. She is agreeable.  -I recommend she elevate her feet and use compression socks    PLAN: -B/l LE doppler today or tomorrow to rule out DVT  -Refilled  Xanax to [facility] -Send SW referral. -Labs reviewed and adequate to proceed with Ferric Gluconate and will hold avastin todaydue to proteinuria  -Lab, flush, ferric gluconateweekly(holdironif ferritin >200 on last lab) starting in 2 weeks  -Continue Avastin every 2 weeks X4 -Continue prenatal Vitamin.  -F/u in 4 weeks    No problem-specific Assessment & Plan notes found for this encounter.   Orders Placed This Encounter  Procedures  . Ambulatory referral to Social Work    Referral Priority:   Routine    Referral Type:   Consultation    Referral Reason:   Specialty Services Required    Number of Visits Requested:   1   All questions were answered. The patient knows to call the clinic with any problems, questions or concerns. No barriers  to learning was detected. The total time spent in the appointment was 30 minutes.     Truitt Merle, MD 09/29/2019   I, Joslyn Devon, am acting as scribe for Truitt Merle, MD.   I have reviewed the above documentation for accuracy and completeness, and I agree with the above.

## 2019-09-27 NOTE — Progress Notes (Signed)
   CC: Bilateral foot pain, HTN, and diabetes  HPI:  Peggy House is a 59 y.o. today history listed below presenting for bilateral leg pain and follow-up of chronic medical conditions.  Past Medical History:  Diagnosis Date  . Anxiety   . Arthritis    knnes,back  . GERD (gastroesophageal reflux disease)   . 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 History of swelling of feet   . Hyperlipidemia   . Hypertension   . Major depressive disorder, recurrent episode ([place]) 06/05/2015  . Obesity   . Snores   . 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  Respiratory: Negative for shortness of breath.   Cardiovascular: Negative for chest pain and leg swelling.  Gastrointestinal: Negative for abdominal pain, nausea and vomiting.  Neurological: Negative for dizziness and headaches.  MSK: Bilateral ankle pain and swelling.    Physical Exam:  Vitals:   09/27/19 1551  BP: (!) 160/82  Pulse: 88  Temp: 99.2 F (37.3 C)  TempSrc: Oral  SpO2: 96%  Weight: 262 lb 12.8 oz (119.2 kg)  Height: 5\' 5"  (1.651 m)   Physical Exam Constitutional:      Appearance: Normal appearance.  HENT:     Head: Normocephalic and atraumatic.     Mouth/Throat:     Mouth: Mucous membranes are moist.  Eyes:     Extraocular Movements: Extraocular movements intact.     Pupils: Pupils are equal, round, and reactive to light.  Cardiovascular:     Rate and Rhythm: Normal rate and regular rhythm.     Pulses: Normal pulses.     Heart sounds: Normal heart sounds.  Pulmonary:     Effort: Pulmonary effort is normal.     Breath sounds: Normal breath sounds.  Abdominal:     General: Abdomen is flat. Bowel sounds are normal.     Palpations: Abdomen is soft.  Musculoskeletal:        General: Swelling present.     Cervical back: Normal range of motion and neck supple.     Comments: BL ankle TTP diffusely, mainly  under medial malleolus, mild warmth and erythema noted, no pain with active or passive movement  Skin:    General: Skin is warm and dry.     Capillary Refill: Capillary refill takes less than 2 seconds.  Neurological:     General: No focal deficit present.     Mental Status: She is alert and oriented to person, place, and time.  Psychiatric:        Mood and Affect: Mood normal.        Behavior: Behavior normal.     Assessment & Plan:   See Encounters Tab for problem based charting.  Patient seen with Dr. Philipp Ovens

## 2019-09-28 DIAGNOSIS — N3941 Urge incontinence: Secondary | ICD-10-CM | POA: Insufficient documentation

## 2019-09-28 DIAGNOSIS — M1990 Unspecified osteoarthritis, unspecified site: Secondary | ICD-10-CM | POA: Diagnosis not present

## 2019-09-28 DIAGNOSIS — M79604 Pain in right leg: Secondary | ICD-10-CM | POA: Insufficient documentation

## 2019-09-28 LAB — MICROALBUMIN / CREATININE URINE RATIO
Creatinine, Urine: 131.8 mg/dL
Microalb/Creat Ratio: 2868 mg/g creat — ABNORMAL HIGH (ref 0–29)
Microalbumin, Urine: 3780.4 ug/mL

## 2019-09-28 NOTE — Assessment & Plan Note (Signed)
Patient reports history of urinary incontinence, reports that she can't hold her urine in. She reports that she also losses urine when she coughs or does physical activity, but can also come out when she is just sitting around and when she is resting. No bowel incontinence. Reports that it's been going on for about 3 months. Denies any dysuria or hematuria. She reports using incotinence depends which helps her move around and go about her day. Concerning for mixed incontinence, agree with incontinence supplies. Consider obtaining U/A on next visit to further evaluate.

## 2019-09-28 NOTE — Assessment & Plan Note (Signed)
Patient is currently on Metformin 1000 mg daily, last A1c was well controlled at 5.8.  Repeat A1c today showed 5.6.  Patient being started on a prednisone taper for presumed gout flare, provided meter to monitor CBGs.  -Continue Metformin 1000 mg daily

## 2019-09-28 NOTE — Progress Notes (Signed)
Internal Medicine Clinic Attending  Case discussed with Dr. Sherry Ruffing at the time of the visit.  We reviewed the resident's history and exam and pertinent patient test results.  I agree with the assessment, diagnosis, and plan of care documented in the resident's note.   Patient here with bilateral LE pain and swelling of unclear etiology for a few days duration. On exam she has non pitting edema to the midshin bilaterally, with significant soft tissue swelling around her ankles. Both ankles have increased warmth and are very tender to light palpation. I doubt DVT given findings are bilateral. I agree with treating empirically for gout. She cannot take NSAIDs due to her history of HHT and risk of bleeding. She is a diabetic but well controlled on metformin. Will try prednisone course. If symptoms persist, consider further work up with dopplers to rule out DVT vs. Treatment for venous insufficiency.

## 2019-09-28 NOTE — Telephone Encounter (Signed)
Dr. Sherry Ruffing, did you address need for incontinence supplies at yesterday's OV?

## 2019-09-28 NOTE — Assessment & Plan Note (Signed)
Patient reports.  She has been having bilateral ankle and leg pain for a few months now, appears to be worsening and occurs daily, nothing seems to improve it, she has tried Tylenol which has not helped.  She has a history HHT and is not able to take any NSAIDs. She reports that she has never had this before.  She has a history of well-controlled diabetes, no history of neuropathy.  She has no history of gout, denies any alcohol or increased red meat use.  Denies any fevers, chills, nausea, vomiting, headaches, lightheadedness or dizziness, fatigue, weakness, or any other symptoms.  On exam she does have some warmth on bilateral ankles, both slightly tender to palpation under the medial malleolus, no pain with active passive movement of the ankle, mild erythema noted.  Unclear cause of symptoms at this time.  Does not appear to be a DVT or clot, given the sudden onset of bilateral lower ankle pain. Also did not appear to be neuropathic, her diabetes is well controlled. Given the location of ankle swelling and pain could consider a gout flare. At this time we will treat for a presumed gout flare with steroids, not able to take NSAIDs due to [place]. Could also consider venous insufficiency as the cause.  If symptoms do not improve could consider further imaging and advise to use compression stockings, she does have a history of venous stasis dermatitis.   -Prednisone taper -RTC in 1 month, advised to return sooner if symptoms do not improve

## 2019-09-28 NOTE — Assessment & Plan Note (Signed)
Patient is currently on lisinopril 40 mg daily, Coreg 25 mg twice daily and amlodipine 10 mg daily.  She denies any issues taking medications.  Blood pressure today 160/82.  She reports that she did not take her blood pressure medications today and will take later today.  She is also having significant bilateral lower extremity pain which could be contributing to her hypertension.  We will hold off on adjusting medications at this time however advised to take medications daily and to monitor blood pressure at home.  -Continue lisinopril 40 mg daily -Continue Coreg 25 mg twice daily -Continue amlodipine 10 mg daily

## 2019-09-29 ENCOUNTER — Other Ambulatory Visit: Payer: Self-pay

## 2019-09-29 ENCOUNTER — Encounter: Payer: Self-pay | Admitting: Hematology

## 2019-09-29 ENCOUNTER — Inpatient Hospital Stay: Payer: Medicaid Other

## 2019-09-29 ENCOUNTER — Inpatient Hospital Stay (HOSPITAL_BASED_OUTPATIENT_CLINIC_OR_DEPARTMENT_OTHER): Payer: Medicaid Other | Admitting: Hematology

## 2019-09-29 VITALS — BP 171/81 | HR 73 | Temp 98.6°F | Resp 18

## 2019-09-29 DIAGNOSIS — I780 Hereditary hemorrhagic telangiectasia: Secondary | ICD-10-CM

## 2019-09-29 DIAGNOSIS — Z95828 Presence of other vascular implants and grafts: Secondary | ICD-10-CM

## 2019-09-29 DIAGNOSIS — D500 Iron deficiency anemia secondary to blood loss (chronic): Secondary | ICD-10-CM

## 2019-09-29 DIAGNOSIS — M1990 Unspecified osteoarthritis, unspecified site: Secondary | ICD-10-CM | POA: Diagnosis not present

## 2019-09-29 LAB — CBC WITH DIFFERENTIAL (CANCER CENTER ONLY)
Abs Immature Granulocytes: 0.12 10*3/uL — ABNORMAL HIGH (ref 0.00–0.07)
Basophils Absolute: 0 10*3/uL (ref 0.0–0.1)
Basophils Relative: 0 %
Eosinophils Absolute: 0 10*3/uL (ref 0.0–0.5)
Eosinophils Relative: 0 %
HCT: 33.9 % — ABNORMAL LOW (ref 36.0–46.0)
Hemoglobin: 9.9 g/dL — ABNORMAL LOW (ref 12.0–15.0)
Immature Granulocytes: 1 %
Lymphocytes Relative: 6 %
Lymphs Abs: 1.2 10*3/uL (ref 0.7–4.0)
MCH: 23.7 pg — ABNORMAL LOW (ref 26.0–34.0)
MCHC: 29.2 g/dL — ABNORMAL LOW (ref 30.0–36.0)
MCV: 81.1 fL (ref 80.0–100.0)
Monocytes Absolute: 0.6 10*3/uL (ref 0.1–1.0)
Monocytes Relative: 3 %
Neutro Abs: 19 10*3/uL — ABNORMAL HIGH (ref 1.7–7.7)
Neutrophils Relative %: 90 %
Platelet Count: 310 10*3/uL (ref 150–400)
RBC: 4.18 MIL/uL (ref 3.87–5.11)
RDW: 23.3 % — ABNORMAL HIGH (ref 11.5–15.5)
WBC Count: 20.9 10*3/uL — ABNORMAL HIGH (ref 4.0–10.5)
nRBC: 0 % (ref 0.0–0.2)

## 2019-09-29 LAB — FERRITIN: Ferritin: 86 ng/mL (ref 11–307)

## 2019-09-29 LAB — TOTAL PROTEIN, URINE DIPSTICK: Protein, ur: 300 mg/dL — AB

## 2019-09-29 MED ORDER — ALPRAZOLAM 0.5 MG PO TABS
0.5000 mg | ORAL_TABLET | Freq: Every evening | ORAL | 0 refills | Status: DC | PRN
Start: 2019-09-29 — End: 2019-11-08

## 2019-09-29 MED ORDER — SODIUM CHLORIDE 0.9 % IV SOLN
Freq: Once | INTRAVENOUS | Status: AC
  Filled 2019-09-29: qty 250

## 2019-09-29 MED ORDER — SODIUM CHLORIDE 0.9 % IV SOLN
125.0000 mg | Freq: Once | INTRAVENOUS | Status: AC
  Administered 2019-09-29: 125 mg via INTRAVENOUS
  Filled 2019-09-29: qty 10

## 2019-09-29 MED ORDER — SODIUM CHLORIDE 0.9% FLUSH
10.0000 mL | Freq: Once | INTRAVENOUS | Status: AC
  Administered 2019-09-29: 10 mL
  Filled 2019-09-29: qty 10

## 2019-09-29 MED ORDER — HEPARIN SOD (PORK) LOCK FLUSH 100 UNIT/ML IV SOLN
500.0000 [IU] | Freq: Once | INTRAVENOUS | Status: AC
  Administered 2019-09-29: 500 [IU]
  Filled 2019-09-29: qty 5

## 2019-09-29 NOTE — Patient Instructions (Signed)
Sodium Ferric Gluconate Complex injection What is this medicine? SODIUM FERRIC GLUCONATE COMPLEX (SOE dee um FER ik GLOO koe nate KOM pleks) is an iron replacement. It is used with epoetin therapy to treat low iron levels in patients who are receiving hemodialysis. This medicine may be used for other purposes; ask your health care provider or pharmacist if you have questions. COMMON BRAND NAME(S): Ferrlecit, Nulecit What should I tell my health care provider before I take this medicine? They need to know if you have any of the following conditions:  anemia that is not from iron deficiency  high levels of iron in the body  an unusual or allergic reaction to iron, benzyl alcohol, other medicines, foods, dyes, or preservatives  pregnant or are trying to become pregnant  breast-feeding How should I use this medicine? This medicine is for infusion into a vein. It is given by a health care professional in a hospital or clinic setting. Talk to your pediatrician regarding the use of this medicine in children. While this drug may be prescribed for children as young as 6 years old for selected conditions, precautions do apply. Overdosage: If you think you have taken too much of this medicine contact a poison control center or emergency room at once. NOTE: This medicine is only for you. Do not share this medicine with others. What if I miss a dose? It is important not to miss your dose. Call your doctor or health care professional if you are unable to keep an appointment. What may interact with this medicine? Do not take this medicine with any of the following medications:  deferoxamine  dimercaprol  other iron products This medicine may also interact with the following medications:  chloramphenicol  deferasirox  medicine for blood pressure like enalapril This list may not describe all possible interactions. Give your health care provider a list of all the medicines, herbs,  non-prescription drugs, or dietary supplements you use. Also tell them if you smoke, drink alcohol, or use illegal drugs. Some items may interact with your medicine. What should I watch for while using this medicine? Your condition will be monitored carefully while you are receiving this medicine. Visit your doctor for check-ups as directed. What side effects may I notice from receiving this medicine? Side effects that you should report to your doctor or health care professional as soon as possible:  allergic reactions like skin rash, itching or hives, swelling of the face, lips, or tongue  breathing problems  changes in hearing  changes in vision  chills, flushing, or sweating  fast, irregular heartbeat  feeling faint or lightheaded, falls  fever, flu-like symptoms  high or low blood pressure  pain, tingling, numbness in the hands or feet  severe pain in the chest, back, flanks, or groin  swelling of the ankles, feet, hands  trouble passing urine or change in the amount of urine  unusually weak or tired Side effects that usually do not require medical attention (report to your doctor or health care professional if they continue or are bothersome):  cramps  dark colored stools  diarrhea  headache  nausea, vomiting  stomach upset This list may not describe all possible side effects. Call your doctor for medical advice about side effects. You may report side effects to FDA at 1-800-FDA-1088. Where should I keep my medicine? This drug is given in a hospital or clinic and will not be stored at home. NOTE: This sheet is a summary. It may not cover all   possible information. If you have questions about this medicine, talk to your doctor, pharmacist, or health care provider.  2020 Elsevier/Gold Standard (2007-12-02 15:58:57)  

## 2019-09-29 NOTE — Progress Notes (Signed)
Urine protein 300 today. Per Dr. Burr Medico, no avastin today. Patient to only receive Iron infusion.

## 2019-09-29 NOTE — Patient Instructions (Signed)

## 2019-09-29 NOTE — Progress Notes (Signed)
I reviewed the u/s appt for 09/30/2019 at 1100 at [facility] with [name].  Transportation has been arranged for her.  She verbalized understanding.

## 2019-09-30 ENCOUNTER — Encounter: Payer: Self-pay | Admitting: Hematology

## 2019-09-30 ENCOUNTER — Other Ambulatory Visit: Payer: Self-pay | Admitting: Hematology

## 2019-09-30 ENCOUNTER — Other Ambulatory Visit: Payer: Self-pay | Admitting: Nurse Practitioner

## 2019-09-30 ENCOUNTER — Ambulatory Visit (HOSPITAL_COMMUNITY)
Admission: RE | Admit: 2019-09-30 | Discharge: 2019-09-30 | Disposition: A | Discharge status: Home / Self Care | Payer: Medicaid Other | Source: Ambulatory Visit | Attending: Hematology | Admitting: Hematology

## 2019-09-30 DIAGNOSIS — I780 Hereditary hemorrhagic telangiectasia: Secondary | ICD-10-CM | POA: Diagnosis not present

## 2019-09-30 DIAGNOSIS — I10 Essential (primary) hypertension: Secondary | ICD-10-CM

## 2019-09-30 NOTE — Telephone Encounter (Signed)
Signed Rx, CMN, and OV notes from 09/27/2019 faxed to [employer] at [phone]. Fax confirmation receipt received. Hubbard Hartshorn, BSN, RN-BC

## 2019-09-30 NOTE — Progress Notes (Signed)
VASCULAR LAB PRELIMINARY  PRELIMINARY  PRELIMINARY  PRELIMINARY  Bilateral lower extremity venous duplex completed.    Preliminary report:  See CV proc for preliminary results.  Left message on Dr. Ernestina Penna nurse's voicemail  Mauro Kaufmann, Emilyann Banka, RVT 09/30/2019, 11:08 AM

## 2019-10-01 ENCOUNTER — Telehealth: Payer: Self-pay | Admitting: General Practice

## 2019-10-01 ENCOUNTER — Other Ambulatory Visit: Payer: Self-pay | Admitting: Hematology

## 2019-10-01 DIAGNOSIS — R040 Epistaxis: Secondary | ICD-10-CM

## 2019-10-01 DIAGNOSIS — M1990 Unspecified osteoarthritis, unspecified site: Secondary | ICD-10-CM | POA: Diagnosis not present

## 2019-10-01 NOTE — Telephone Encounter (Signed)
[facility] CSW Progress Notes  Call to patient per referral from Dr Burr Medico, needs help w anxiety management.  Called patient, no answer, left VM w my contact information and encouragement to call back at her convenience.  Edwyna Shell, LCSW Clinical Social Worker Phone:  [phone] Cell:  [phone]

## 2019-10-04 ENCOUNTER — Encounter: Payer: Self-pay | Admitting: *Deleted

## 2019-10-04 DIAGNOSIS — N3941 Urge incontinence: Secondary | ICD-10-CM | POA: Diagnosis not present

## 2019-10-04 DIAGNOSIS — N319 Neuromuscular dysfunction of bladder, unspecified: Secondary | ICD-10-CM | POA: Diagnosis not present

## 2019-10-04 DIAGNOSIS — M1990 Unspecified osteoarthritis, unspecified site: Secondary | ICD-10-CM | POA: Diagnosis not present

## 2019-10-04 NOTE — Progress Notes (Signed)
[facility] Work  [employer] representative made 2nd attempt to contacted patient at home after request from medical oncologist for counseling and emotional support.  CSW left patient a message offering support.  CSW provided contact information and encouraged patient to return call.    Peggy House, MSW, LCSW, OSW-C Clinical Social Worker [facility] [phone]

## 2019-10-05 ENCOUNTER — Telehealth: Payer: Self-pay | Admitting: Hematology

## 2019-10-05 DIAGNOSIS — M1990 Unspecified osteoarthritis, unspecified site: Secondary | ICD-10-CM | POA: Diagnosis not present

## 2019-10-05 NOTE — Telephone Encounter (Signed)
Scheduled appt per 6/18 sch message - pt is aware of appts added.

## 2019-10-06 ENCOUNTER — Other Ambulatory Visit: Payer: Self-pay | Admitting: Internal Medicine

## 2019-10-06 ENCOUNTER — Other Ambulatory Visit: Payer: Self-pay | Admitting: *Deleted

## 2019-10-06 DIAGNOSIS — R040 Epistaxis: Secondary | ICD-10-CM

## 2019-10-06 DIAGNOSIS — M1990 Unspecified osteoarthritis, unspecified site: Secondary | ICD-10-CM | POA: Diagnosis not present

## 2019-10-06 MED ORDER — AMINOCAPROIC ACID 1000 MG PO TABS: ORAL_TABLET | ORAL | 1 refills | Status: DC

## 2019-10-07 DIAGNOSIS — M1990 Unspecified osteoarthritis, unspecified site: Secondary | ICD-10-CM | POA: Diagnosis not present

## 2019-10-08 DIAGNOSIS — M1990 Unspecified osteoarthritis, unspecified site: Secondary | ICD-10-CM | POA: Diagnosis not present

## 2019-10-11 ENCOUNTER — Telehealth: Payer: Self-pay

## 2019-10-11 DIAGNOSIS — M1990 Unspecified osteoarthritis, unspecified site: Secondary | ICD-10-CM | POA: Diagnosis not present

## 2019-10-11 NOTE — Telephone Encounter (Signed)
[name] left vm stating she would like a call back as she has medications that need refills.  I returned her call and also left vm asking her to leave the names of the medications needing refilled

## 2019-10-12 DIAGNOSIS — M1990 Unspecified osteoarthritis, unspecified site: Secondary | ICD-10-CM | POA: Diagnosis not present

## 2019-10-13 ENCOUNTER — Other Ambulatory Visit: Payer: Self-pay

## 2019-10-13 ENCOUNTER — Ambulatory Visit (INDEPENDENT_AMBULATORY_CARE_PROVIDER_SITE_OTHER): Payer: Medicaid Other | Admitting: Podiatry

## 2019-10-13 ENCOUNTER — Encounter: Payer: Self-pay | Admitting: Podiatry

## 2019-10-13 DIAGNOSIS — L851 Acquired keratosis [keratoderma] palmaris et plantaris: Secondary | ICD-10-CM

## 2019-10-13 DIAGNOSIS — M1990 Unspecified osteoarthritis, unspecified site: Secondary | ICD-10-CM | POA: Diagnosis not present

## 2019-10-13 DIAGNOSIS — M79675 Pain in left toe(s): Secondary | ICD-10-CM

## 2019-10-13 DIAGNOSIS — M79674 Pain in right toe(s): Secondary | ICD-10-CM | POA: Diagnosis not present

## 2019-10-13 DIAGNOSIS — E1159 Type 2 diabetes mellitus with other circulatory complications: Secondary | ICD-10-CM

## 2019-10-13 DIAGNOSIS — B351 Tinea unguium: Secondary | ICD-10-CM

## 2019-10-13 NOTE — Progress Notes (Signed)
This patient presents the office today for continued treatment of her long thick nails.  These nails are painful walking wearing her shoes.  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    She says she is unaware of any diagnosis by the dermatologist.  She says that she feels there is no improvement since she started to take the medication.  She presents the office today for preventative foot care services for her nails.  This patient has history of DVT and venous stasis dermatitis.    Vascular  Dorsalis pedis and posterior tibial pulses are palpable  B/L.  Capillary return  WNL.  Temperature gradient is  WNL.  Skin turgor  WNL  Sensorium  Senn Weinstein monofilament wire  WNL. Normal tactile sensation.  Nail Exam  Patient has normal nails with no evidence of bacterial or fungal infection.  Orthopedic  Exam  Muscle tone and muscle strength  WNL.  No limitations of motion feet  B/L.  No crepitus or joint effusion noted.  Foot type is unremarkable and digits show no abnormalities.  Bony prominences are unremarkable.  Skin  No open lesions.  Normal skin texture and turgor. Plantar keratoderma heels and forefeet  B/L  Dystropic nails  B/L  ROV.  .  .  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  She has seen minimal improvement..  Debridement of her dystropic nails were performed. Using a nail nipper and dremel tool.   RTC 3 months.   Gardiner Barefoot DPM

## 2019-10-14 ENCOUNTER — Inpatient Hospital Stay: Payer: Medicaid Other | Attending: Hematology

## 2019-10-14 ENCOUNTER — Other Ambulatory Visit: Payer: Self-pay

## 2019-10-14 ENCOUNTER — Inpatient Hospital Stay: Payer: Medicaid Other

## 2019-10-14 VITALS — BP 146/73 | HR 72 | Temp 98.0°F | Resp 18

## 2019-10-14 DIAGNOSIS — D500 Iron deficiency anemia secondary to blood loss (chronic): Secondary | ICD-10-CM

## 2019-10-14 DIAGNOSIS — Z5112 Encounter for antineoplastic immunotherapy: Secondary | ICD-10-CM | POA: Diagnosis not present

## 2019-10-14 DIAGNOSIS — M1990 Unspecified osteoarthritis, unspecified site: Secondary | ICD-10-CM | POA: Diagnosis not present

## 2019-10-14 DIAGNOSIS — Z79899 Other long term (current) drug therapy: Secondary | ICD-10-CM | POA: Diagnosis not present

## 2019-10-14 DIAGNOSIS — I780 Hereditary hemorrhagic telangiectasia: Secondary | ICD-10-CM | POA: Insufficient documentation

## 2019-10-14 DIAGNOSIS — Z95828 Presence of other vascular implants and grafts: Secondary | ICD-10-CM

## 2019-10-14 DIAGNOSIS — R808 Other proteinuria: Secondary | ICD-10-CM

## 2019-10-14 LAB — CMP (CANCER CENTER ONLY)
ALT: 19 U/L (ref 0–44)
AST: 20 U/L (ref 15–41)
Albumin: 3.4 g/dL — ABNORMAL LOW (ref 3.5–5.0)
Alkaline Phosphatase: 84 U/L (ref 38–126)
Anion gap: 12 (ref 5–15)
BUN: 8 mg/dL (ref 6–20)
CO2: 22 mmol/L (ref 22–32)
Calcium: 9.3 mg/dL (ref 8.9–10.3)
Chloride: 106 mmol/L (ref 98–111)
Creatinine: 0.92 mg/dL (ref 0.44–1.00)
GFR, Est AFR Am: >60 mL/min (ref >60)
GFR, Estimated: >60 mL/min (ref >60)
Glucose, Bld: 122 mg/dL — ABNORMAL HIGH (ref 70–99)
Potassium: 3.7 mmol/L (ref 3.5–5.1)
Sodium: 140 mmol/L (ref 135–145)
Total Bilirubin: <0.2 mg/dL — ABNORMAL LOW (ref 0.3–1.2)
Total Protein: 7.8 g/dL (ref 6.5–8.1)

## 2019-10-14 LAB — IRON AND TIBC
Iron: 27 ug/dL — ABNORMAL LOW (ref 41–142)
Saturation Ratios: 11 % — ABNORMAL LOW (ref 21–57)
TIBC: 258 ug/dL (ref 236–444)
UIBC: 230 ug/dL (ref 120–384)

## 2019-10-14 LAB — CBC WITH DIFFERENTIAL (CANCER CENTER ONLY)
Abs Immature Granulocytes: 0.06 10*3/uL (ref 0.00–0.07)
Basophils Absolute: 0.1 10*3/uL (ref 0.0–0.1)
Basophils Relative: 0 %
Eosinophils Absolute: 0.1 10*3/uL (ref 0.0–0.5)
Eosinophils Relative: 1 %
HCT: 34 % — ABNORMAL LOW (ref 36.0–46.0)
Hemoglobin: 10.1 g/dL — ABNORMAL LOW (ref 12.0–15.0)
Immature Granulocytes: 0 %
Lymphocytes Relative: 11 %
Lymphs Abs: 1.5 10*3/uL (ref 0.7–4.0)
MCH: 24.6 pg — ABNORMAL LOW (ref 26.0–34.0)
MCHC: 29.7 g/dL — ABNORMAL LOW (ref 30.0–36.0)
MCV: 82.7 fL (ref 80.0–100.0)
Monocytes Absolute: 0.5 10*3/uL (ref 0.1–1.0)
Monocytes Relative: 4 %
Neutro Abs: 11.5 10*3/uL — ABNORMAL HIGH (ref 1.7–7.7)
Neutrophils Relative %: 84 %
Platelet Count: 292 10*3/uL (ref 150–400)
RBC: 4.11 MIL/uL (ref 3.87–5.11)
RDW: 23.6 % — ABNORMAL HIGH (ref 11.5–15.5)
WBC Count: 13.8 10*3/uL — ABNORMAL HIGH (ref 4.0–10.5)
nRBC: 0 % (ref 0.0–0.2)

## 2019-10-14 LAB — TOTAL PROTEIN, URINE DIPSTICK: Protein, ur: 300 mg/dL — AB

## 2019-10-14 MED ORDER — HEPARIN SOD (PORK) LOCK FLUSH 100 UNIT/ML IV SOLN
500.0000 [IU] | Freq: Once | INTRAVENOUS | Status: AC
  Administered 2019-10-14: 500 [IU]
  Filled 2019-10-14: qty 5

## 2019-10-14 MED ORDER — SODIUM CHLORIDE 0.9% FLUSH
10.0000 mL | Freq: Once | INTRAVENOUS | Status: AC
  Administered 2019-10-14: 10 mL
  Filled 2019-10-14: qty 10

## 2019-10-14 MED ORDER — SODIUM CHLORIDE 0.9 % IV SOLN
125.0000 mg | Freq: Once | INTRAVENOUS | Status: AC
  Administered 2019-10-14: 125 mg via INTRAVENOUS
  Filled 2019-10-14: qty 10

## 2019-10-14 MED ORDER — SODIUM CHLORIDE 0.9 % IV SOLN
INTRAVENOUS | Status: DC
  Filled 2019-10-14: qty 250

## 2019-10-14 NOTE — Patient Instructions (Signed)
Sodium Ferric Gluconate Complex injection What is this medicine? SODIUM FERRIC GLUCONATE COMPLEX (SOE dee um FER ik GLOO koe nate KOM pleks) is an iron replacement. It is used with epoetin therapy to treat low iron levels in patients who are receiving hemodialysis. This medicine may be used for other purposes; ask your health care provider or pharmacist if you have questions. COMMON BRAND NAME(S): Ferrlecit, Nulecit What should I tell my health care provider before I take this medicine? They need to know if you have any of the following conditions:  anemia that is not from iron deficiency  high levels of iron in the body  an unusual or allergic reaction to iron, benzyl alcohol, other medicines, foods, dyes, or preservatives  pregnant or are trying to become pregnant  breast-feeding How should I use this medicine? This medicine is for infusion into a vein. It is given by a health care professional in a hospital or clinic setting. Talk to your pediatrician regarding the use of this medicine in children. While this drug may be prescribed for children as young as 6 years old for selected conditions, precautions do apply. Overdosage: If you think you have taken too much of this medicine contact a poison control center or emergency room at once. NOTE: This medicine is only for you. Do not share this medicine with others. What if I miss a dose? It is important not to miss your dose. Call your doctor or health care professional if you are unable to keep an appointment. What may interact with this medicine? Do not take this medicine with any of the following medications:  deferoxamine  dimercaprol  other iron products This medicine may also interact with the following medications:  chloramphenicol  deferasirox  medicine for blood pressure like enalapril This list may not describe all possible interactions. Give your health care provider a list of all the medicines, herbs,  non-prescription drugs, or dietary supplements you use. Also tell them if you smoke, drink alcohol, or use illegal drugs. Some items may interact with your medicine. What should I watch for while using this medicine? Your condition will be monitored carefully while you are receiving this medicine. Visit your doctor for check-ups as directed. What side effects may I notice from receiving this medicine? Side effects that you should report to your doctor or health care professional as soon as possible:  allergic reactions like skin rash, itching or hives, swelling of the face, lips, or tongue  breathing problems  changes in hearing  changes in vision  chills, flushing, or sweating  fast, irregular heartbeat  feeling faint or lightheaded, falls  fever, flu-like symptoms  high or low blood pressure  pain, tingling, numbness in the hands or feet  severe pain in the chest, back, flanks, or groin  swelling of the ankles, feet, hands  trouble passing urine or change in the amount of urine  unusually weak or tired Side effects that usually do not require medical attention (report to your doctor or health care professional if they continue or are bothersome):  cramps  dark colored stools  diarrhea  headache  nausea, vomiting  stomach upset This list may not describe all possible side effects. Call your doctor for medical advice about side effects. You may report side effects to FDA at 1-800-FDA-1088. Where should I keep my medicine? This drug is given in a hospital or clinic and will not be stored at home. NOTE: This sheet is a summary. It may not cover all   possible information. If you have questions about this medicine, talk to your doctor, pharmacist, or health care provider.  2020 Elsevier/Gold Standard (2007-12-02 15:58:57)  

## 2019-10-14 NOTE — Progress Notes (Signed)
Per Dr Burr Medico, holding avastin due to elevated urine protein.

## 2019-10-15 DIAGNOSIS — M1990 Unspecified osteoarthritis, unspecified site: Secondary | ICD-10-CM | POA: Diagnosis not present

## 2019-10-18 DIAGNOSIS — M1990 Unspecified osteoarthritis, unspecified site: Secondary | ICD-10-CM | POA: Diagnosis not present

## 2019-10-19 DIAGNOSIS — M1990 Unspecified osteoarthritis, unspecified site: Secondary | ICD-10-CM | POA: Diagnosis not present

## 2019-10-20 DIAGNOSIS — M1990 Unspecified osteoarthritis, unspecified site: Secondary | ICD-10-CM | POA: Diagnosis not present

## 2019-10-21 ENCOUNTER — Inpatient Hospital Stay: Payer: Medicaid Other

## 2019-10-21 ENCOUNTER — Other Ambulatory Visit: Payer: Self-pay

## 2019-10-21 ENCOUNTER — Other Ambulatory Visit: Payer: Medicaid Other

## 2019-10-21 VITALS — BP 140/77 | HR 72 | Temp 98.6°F | Resp 18

## 2019-10-21 DIAGNOSIS — D500 Iron deficiency anemia secondary to blood loss (chronic): Secondary | ICD-10-CM

## 2019-10-21 DIAGNOSIS — I780 Hereditary hemorrhagic telangiectasia: Secondary | ICD-10-CM

## 2019-10-21 DIAGNOSIS — Z95828 Presence of other vascular implants and grafts: Secondary | ICD-10-CM

## 2019-10-21 DIAGNOSIS — M1990 Unspecified osteoarthritis, unspecified site: Secondary | ICD-10-CM | POA: Diagnosis not present

## 2019-10-21 DIAGNOSIS — Z5112 Encounter for antineoplastic immunotherapy: Secondary | ICD-10-CM | POA: Diagnosis not present

## 2019-10-21 LAB — CMP (CANCER CENTER ONLY)
ALT: 19 U/L (ref 0–44)
AST: 21 U/L (ref 15–41)
Albumin: 3.4 g/dL — ABNORMAL LOW (ref 3.5–5.0)
Alkaline Phosphatase: 83 U/L (ref 38–126)
Anion gap: 12 (ref 5–15)
BUN: 8 mg/dL (ref 6–20)
CO2: 25 mmol/L (ref 22–32)
Calcium: 9.7 mg/dL (ref 8.9–10.3)
Chloride: 104 mmol/L (ref 98–111)
Creatinine: 0.84 mg/dL (ref 0.44–1.00)
GFR, Est AFR Am: >60 mL/min (ref >60)
GFR, Estimated: >60 mL/min (ref >60)
Glucose, Bld: 104 mg/dL — ABNORMAL HIGH (ref 70–99)
Potassium: 3.8 mmol/L (ref 3.5–5.1)
Sodium: 141 mmol/L (ref 135–145)
Total Bilirubin: 0.3 mg/dL (ref 0.3–1.2)
Total Protein: 7.6 g/dL (ref 6.5–8.1)

## 2019-10-21 LAB — CBC WITH DIFFERENTIAL (CANCER CENTER ONLY)
Abs Immature Granulocytes: 0.03 10*3/uL (ref 0.00–0.07)
Basophils Absolute: 0 10*3/uL (ref 0.0–0.1)
Basophils Relative: 0 %
Eosinophils Absolute: 0.1 10*3/uL (ref 0.0–0.5)
Eosinophils Relative: 1 %
HCT: 34.2 % — ABNORMAL LOW (ref 36.0–46.0)
Hemoglobin: 10.2 g/dL — ABNORMAL LOW (ref 12.0–15.0)
Immature Granulocytes: 0 %
Lymphocytes Relative: 12 %
Lymphs Abs: 1.4 10*3/uL (ref 0.7–4.0)
MCH: 24.3 pg — ABNORMAL LOW (ref 26.0–34.0)
MCHC: 29.8 g/dL — ABNORMAL LOW (ref 30.0–36.0)
MCV: 81.6 fL (ref 80.0–100.0)
Monocytes Absolute: 0.5 10*3/uL (ref 0.1–1.0)
Monocytes Relative: 4 %
Neutro Abs: 9.6 10*3/uL — ABNORMAL HIGH (ref 1.7–7.7)
Neutrophils Relative %: 83 %
Platelet Count: 275 10*3/uL (ref 150–400)
RBC: 4.19 MIL/uL (ref 3.87–5.11)
RDW: 23.9 % — ABNORMAL HIGH (ref 11.5–15.5)
WBC Count: 11.7 10*3/uL — ABNORMAL HIGH (ref 4.0–10.5)
nRBC: 0 % (ref 0.0–0.2)

## 2019-10-21 LAB — FERRITIN: Ferritin: 121 ng/mL (ref 11–307)

## 2019-10-21 MED ORDER — HEPARIN SOD (PORK) LOCK FLUSH 100 UNIT/ML IV SOLN
500.0000 [IU] | Freq: Once | INTRAVENOUS | Status: AC
  Administered 2019-10-21: 500 [IU]
  Filled 2019-10-21: qty 5

## 2019-10-21 MED ORDER — SODIUM CHLORIDE 0.9% FLUSH
10.0000 mL | Freq: Once | INTRAVENOUS | Status: AC
  Administered 2019-10-21: 10 mL
  Filled 2019-10-21: qty 10

## 2019-10-21 MED ORDER — SODIUM CHLORIDE 0.9 % IV SOLN
125.0000 mg | Freq: Once | INTRAVENOUS | Status: AC
  Administered 2019-10-21: 125 mg via INTRAVENOUS
  Filled 2019-10-21: qty 10

## 2019-10-21 MED ORDER — SODIUM CHLORIDE 0.9 % IV SOLN
INTRAVENOUS | Status: DC
  Filled 2019-10-21: qty 250

## 2019-10-21 NOTE — Patient Instructions (Signed)
Sodium Ferric Gluconate Complex injection What is this medicine? SODIUM FERRIC GLUCONATE COMPLEX (SOE dee um FER ik GLOO koe nate KOM pleks) is an iron replacement. It is used with epoetin therapy to treat low iron levels in patients who are receiving hemodialysis. This medicine may be used for other purposes; ask your health care provider or pharmacist if you have questions. COMMON BRAND NAME(S): Ferrlecit, Nulecit What should I tell my health care provider before I take this medicine? They need to know if you have any of the following conditions:  anemia that is not from iron deficiency  high levels of iron in the body  an unusual or allergic reaction to iron, benzyl alcohol, other medicines, foods, dyes, or preservatives  pregnant or are trying to become pregnant  breast-feeding How should I use this medicine? This medicine is for infusion into a vein. It is given by a health care professional in a hospital or clinic setting. Talk to your pediatrician regarding the use of this medicine in children. While this drug may be prescribed for children as young as 6 years old for selected conditions, precautions do apply. Overdosage: If you think you have taken too much of this medicine contact a poison control center or emergency room at once. NOTE: This medicine is only for you. Do not share this medicine with others. What if I miss a dose? It is important not to miss your dose. Call your doctor or health care professional if you are unable to keep an appointment. What may interact with this medicine? Do not take this medicine with any of the following medications:  deferoxamine  dimercaprol  other iron products This medicine may also interact with the following medications:  chloramphenicol  deferasirox  medicine for blood pressure like enalapril This list may not describe all possible interactions. Give your health care provider a list of all the medicines, herbs,  non-prescription drugs, or dietary supplements you use. Also tell them if you smoke, drink alcohol, or use illegal drugs. Some items may interact with your medicine. What should I watch for while using this medicine? Your condition will be monitored carefully while you are receiving this medicine. Visit your doctor for check-ups as directed. What side effects may I notice from receiving this medicine? Side effects that you should report to your doctor or health care professional as soon as possible:  allergic reactions like skin rash, itching or hives, swelling of the face, lips, or tongue  breathing problems  changes in hearing  changes in vision  chills, flushing, or sweating  fast, irregular heartbeat  feeling faint or lightheaded, falls  fever, flu-like symptoms  high or low blood pressure  pain, tingling, numbness in the hands or feet  severe pain in the chest, back, flanks, or groin  swelling of the ankles, feet, hands  trouble passing urine or change in the amount of urine  unusually weak or tired Side effects that usually do not require medical attention (report to your doctor or health care professional if they continue or are bothersome):  cramps  dark colored stools  diarrhea  headache  nausea, vomiting  stomach upset This list may not describe all possible side effects. Call your doctor for medical advice about side effects. You may report side effects to FDA at 1-800-FDA-1088. Where should I keep my medicine? This drug is given in a hospital or clinic and will not be stored at home. NOTE: This sheet is a summary. It may not cover all   possible information. If you have questions about this medicine, talk to your doctor, pharmacist, or health care provider.  2020 Elsevier/Gold Standard (2007-12-02 15:58:57)  

## 2019-10-22 DIAGNOSIS — M1990 Unspecified osteoarthritis, unspecified site: Secondary | ICD-10-CM | POA: Diagnosis not present

## 2019-10-25 DIAGNOSIS — M1990 Unspecified osteoarthritis, unspecified site: Secondary | ICD-10-CM | POA: Diagnosis not present

## 2019-10-26 DIAGNOSIS — M1990 Unspecified osteoarthritis, unspecified site: Secondary | ICD-10-CM | POA: Diagnosis not present

## 2019-10-27 DIAGNOSIS — M1990 Unspecified osteoarthritis, unspecified site: Secondary | ICD-10-CM | POA: Diagnosis not present

## 2019-10-28 ENCOUNTER — Other Ambulatory Visit: Payer: Self-pay

## 2019-10-28 ENCOUNTER — Inpatient Hospital Stay: Payer: Medicaid Other

## 2019-10-28 ENCOUNTER — Other Ambulatory Visit: Payer: Medicaid Other

## 2019-10-28 ENCOUNTER — Other Ambulatory Visit: Payer: Self-pay | Admitting: Hematology and Oncology

## 2019-10-28 ENCOUNTER — Other Ambulatory Visit: Payer: Self-pay | Admitting: Hematology

## 2019-10-28 VITALS — BP 124/65 | HR 72 | Temp 98.0°F | Resp 18 | Wt 256.2 lb

## 2019-10-28 DIAGNOSIS — D500 Iron deficiency anemia secondary to blood loss (chronic): Secondary | ICD-10-CM

## 2019-10-28 DIAGNOSIS — I780 Hereditary hemorrhagic telangiectasia: Secondary | ICD-10-CM

## 2019-10-28 DIAGNOSIS — Z5112 Encounter for antineoplastic immunotherapy: Secondary | ICD-10-CM | POA: Diagnosis not present

## 2019-10-28 DIAGNOSIS — Z95828 Presence of other vascular implants and grafts: Secondary | ICD-10-CM

## 2019-10-28 LAB — CBC WITH DIFFERENTIAL (CANCER CENTER ONLY)
Abs Immature Granulocytes: 0.05 10*3/uL (ref 0.00–0.07)
Basophils Absolute: 0 10*3/uL (ref 0.0–0.1)
Basophils Relative: 0 %
Eosinophils Absolute: 0.1 10*3/uL (ref 0.0–0.5)
Eosinophils Relative: 1 %
HCT: 33.8 % — ABNORMAL LOW (ref 36.0–46.0)
Hemoglobin: 10.1 g/dL — ABNORMAL LOW (ref 12.0–15.0)
Immature Granulocytes: 0 %
Lymphocytes Relative: 12 %
Lymphs Abs: 1.6 10*3/uL (ref 0.7–4.0)
MCH: 24.9 pg — ABNORMAL LOW (ref 26.0–34.0)
MCHC: 29.9 g/dL — ABNORMAL LOW (ref 30.0–36.0)
MCV: 83.3 fL (ref 80.0–100.0)
Monocytes Absolute: 0.6 10*3/uL (ref 0.1–1.0)
Monocytes Relative: 5 %
Neutro Abs: 10.8 10*3/uL — ABNORMAL HIGH (ref 1.7–7.7)
Neutrophils Relative %: 82 %
Platelet Count: 286 10*3/uL (ref 150–400)
RBC: 4.06 MIL/uL (ref 3.87–5.11)
RDW: 23.5 % — ABNORMAL HIGH (ref 11.5–15.5)
WBC Count: 13.3 10*3/uL — ABNORMAL HIGH (ref 4.0–10.5)
nRBC: 0 % (ref 0.0–0.2)

## 2019-10-28 LAB — CMP (CANCER CENTER ONLY)
ALT: 12 U/L (ref 0–44)
AST: 17 U/L (ref 15–41)
Albumin: 3.6 g/dL (ref 3.5–5.0)
Alkaline Phosphatase: 89 U/L (ref 38–126)
Anion gap: 13 (ref 5–15)
BUN: 9 mg/dL (ref 6–20)
CO2: 24 mmol/L (ref 22–32)
Calcium: 9.7 mg/dL (ref 8.9–10.3)
Chloride: 107 mmol/L (ref 98–111)
Creatinine: 0.85 mg/dL (ref 0.44–1.00)
GFR, Est AFR Am: >60 mL/min (ref >60)
GFR, Estimated: >60 mL/min (ref >60)
Glucose, Bld: 141 mg/dL — ABNORMAL HIGH (ref 70–99)
Potassium: 3.4 mmol/L — ABNORMAL LOW (ref 3.5–5.1)
Sodium: 144 mmol/L (ref 135–145)
Total Bilirubin: 0.3 mg/dL (ref 0.3–1.2)
Total Protein: 7.7 g/dL (ref 6.5–8.1)

## 2019-10-28 LAB — TOTAL PROTEIN, URINE DIPSTICK: Protein, ur: 100 mg/dL — AB

## 2019-10-28 MED ORDER — SODIUM CHLORIDE 0.9% FLUSH
10.0000 mL | Freq: Once | INTRAVENOUS | Status: AC
  Administered 2019-10-28: 10 mL
  Filled 2019-10-28: qty 10

## 2019-10-28 MED ORDER — SODIUM CHLORIDE 0.9 % IV SOLN
INTRAVENOUS | Status: DC
  Filled 2019-10-28: qty 250

## 2019-10-28 MED ORDER — SODIUM CHLORIDE 0.9 % IV SOLN
125.0000 mg | Freq: Once | INTRAVENOUS | Status: AC
  Administered 2019-10-28: 125 mg via INTRAVENOUS
  Filled 2019-10-28: qty 10

## 2019-10-28 MED ORDER — HEPARIN SOD (PORK) LOCK FLUSH 100 UNIT/ML IV SOLN
500.0000 [IU] | Freq: Once | INTRAVENOUS | Status: AC | PRN
  Administered 2019-10-28: 500 [IU]
  Filled 2019-10-28: qty 5

## 2019-10-28 MED ORDER — SODIUM CHLORIDE 0.9% FLUSH
10.0000 mL | INTRAVENOUS | Status: DC | PRN
  Administered 2019-10-28: 10 mL
  Filled 2019-10-28: qty 10

## 2019-10-28 MED ORDER — SODIUM CHLORIDE 0.9 % IV SOLN
Freq: Once | INTRAVENOUS | Status: AC
  Filled 2019-10-28: qty 250

## 2019-10-28 MED ORDER — SODIUM CHLORIDE 0.9 % IV SOLN
5.0000 mg/kg | Freq: Once | INTRAVENOUS | Status: AC
  Administered 2019-10-28: 600 mg via INTRAVENOUS
  Filled 2019-10-28: qty 16

## 2019-10-28 NOTE — Progress Notes (Signed)
Per Dr. Burr Medico, ok to resume bevacizumab today with elevated urine protein. Patient aware and agrees to plan of care.

## 2019-10-28 NOTE — Patient Instructions (Addendum)
[facility] Discharge Instructions for Patients Receiving Chemotherapy  Today you received the following chemotherapy agents: bevacizumab.  To help prevent nausea and vomiting after your treatment, we encourage you to take your nausea medication as directed.   If you develop nausea and vomiting that is not controlled by your nausea medication, call the clinic.   BELOW ARE SYMPTOMS THAT SHOULD BE REPORTED IMMEDIATELY:  *FEVER GREATER THAN 100.5 F  *CHILLS WITH OR WITHOUT FEVER  NAUSEA AND VOMITING THAT IS NOT CONTROLLED WITH YOUR NAUSEA MEDICATION  *UNUSUAL SHORTNESS OF BREATH  *UNUSUAL BRUISING OR BLEEDING  TENDERNESS IN MOUTH AND THROAT WITH OR WITHOUT PRESENCE OF ULCERS  *URINARY PROBLEMS  *BOWEL PROBLEMS  UNUSUAL RASH Items with * indicate a potential emergency and should be followed up as soon as possible.  Feel free to call the clinic should you have any questions or concerns. The clinic phone number is [phone].  Please show the [facility] at check-in to the Emergency Department and triage nurse.  Sodium Ferric Gluconate Complex injection What is this medicine? SODIUM FERRIC GLUCONATE COMPLEX (SOE dee um FER ik GLOO koe nate KOM pleks) is an iron replacement. It is used with epoetin therapy to treat low iron levels in patients who are receiving hemodialysis. This medicine may be used for other purposes; ask your health care provider or pharmacist if you have questions. COMMON BRAND NAME(S): Ferrlecit, Nulecit What should I tell my health care provider before I take this medicine? They need to know if you have any of the following conditions:  anemia that is not from iron deficiency  high levels of iron in the body  an unusual or allergic reaction to iron, benzyl alcohol, other medicines, foods, dyes, or preservatives  pregnant or are trying to become pregnant  breast-feeding How should I use this medicine? This medicine is for  infusion into a vein. It is given by a health care professional in a hospital or clinic setting. Talk to your pediatrician regarding the use of this medicine in children. While this drug may be prescribed for children as young as 72 years old for selected conditions, precautions do apply. Overdosage: If you think you have taken too much of this medicine contact a poison control center or emergency room at once. NOTE: This medicine is only for you. Do not share this medicine with others. What if I miss a dose? It is important not to miss your dose. Call your doctor or health care professional if you are unable to keep an appointment. What may interact with this medicine? Do not take this medicine with any of the following medications:  deferoxamine  dimercaprol  other iron products This medicine may also interact with the following medications:  chloramphenicol  deferasirox  medicine for blood pressure like enalapril This list may not describe all possible interactions. Give your health care provider a list of all the medicines, herbs, non-prescription drugs, or dietary supplements you use. Also tell them if you smoke, drink alcohol, or use illegal drugs. Some items may interact with your medicine. What should I watch for while using this medicine? Your condition will be monitored carefully while you are receiving this medicine. Visit your doctor for check-ups as directed. What side effects may I notice from receiving this medicine? Side effects that you should report to your doctor or health care professional as soon as possible:  allergic reactions like skin rash, itching or hives, swelling of the face, lips, or tongue  breathing problems  changes in hearing  changes in vision  chills, flushing, or sweating  fast, irregular heartbeat  feeling faint or lightheaded, falls  fever, flu-like symptoms  high or low blood pressure  pain, tingling, numbness in the hands or  feet  severe pain in the chest, back, flanks, or groin  swelling of the ankles, feet, hands  trouble passing urine or change in the amount of urine  unusually weak or tired Side effects that usually do not require medical attention (report to your doctor or health care professional if they continue or are bothersome):  cramps  dark colored stools  diarrhea  headache  nausea, vomiting  stomach upset This list may not describe all possible side effects. Call your doctor for medical advice about side effects. You may report side effects to FDA at 1-800-FDA-1088. Where should I keep my medicine? This drug is given in a hospital or clinic and will not be stored at home. NOTE: This sheet is a summary. It may not cover all possible information. If you have questions about this medicine, talk to your doctor, pharmacist, or health care provider.  2020 Elsevier/Gold Standard (2007-12-02 15:58:57)

## 2019-10-29 DIAGNOSIS — M1990 Unspecified osteoarthritis, unspecified site: Secondary | ICD-10-CM | POA: Diagnosis not present

## 2019-11-01 ENCOUNTER — Other Ambulatory Visit: Payer: Medicaid Other

## 2019-11-01 DIAGNOSIS — M1990 Unspecified osteoarthritis, unspecified site: Secondary | ICD-10-CM | POA: Diagnosis not present

## 2019-11-02 DIAGNOSIS — M1990 Unspecified osteoarthritis, unspecified site: Secondary | ICD-10-CM | POA: Diagnosis not present

## 2019-11-03 DIAGNOSIS — N319 Neuromuscular dysfunction of bladder, unspecified: Secondary | ICD-10-CM | POA: Diagnosis not present

## 2019-11-03 DIAGNOSIS — M1990 Unspecified osteoarthritis, unspecified site: Secondary | ICD-10-CM | POA: Diagnosis not present

## 2019-11-03 DIAGNOSIS — N3941 Urge incontinence: Secondary | ICD-10-CM | POA: Diagnosis not present

## 2019-11-04 ENCOUNTER — Inpatient Hospital Stay: Payer: Medicaid Other

## 2019-11-04 ENCOUNTER — Other Ambulatory Visit: Payer: Medicaid Other

## 2019-11-04 ENCOUNTER — Inpatient Hospital Stay: Payer: Medicaid Other | Admitting: Hematology

## 2019-11-04 DIAGNOSIS — M1990 Unspecified osteoarthritis, unspecified site: Secondary | ICD-10-CM | POA: Diagnosis not present

## 2019-11-05 ENCOUNTER — Telehealth: Payer: Self-pay | Admitting: Hematology

## 2019-11-05 ENCOUNTER — Other Ambulatory Visit: Payer: Self-pay | Admitting: Hematology

## 2019-11-05 DIAGNOSIS — M1990 Unspecified osteoarthritis, unspecified site: Secondary | ICD-10-CM | POA: Diagnosis not present

## 2019-11-05 NOTE — Telephone Encounter (Signed)
R/s appt per 7/22 sch msg - pt is aware of aptp date and time

## 2019-11-08 ENCOUNTER — Other Ambulatory Visit: Payer: Self-pay | Admitting: Hematology

## 2019-11-08 DIAGNOSIS — M1990 Unspecified osteoarthritis, unspecified site: Secondary | ICD-10-CM | POA: Diagnosis not present

## 2019-11-08 MED ORDER — ALPRAZOLAM 0.5 MG PO TABS: 0.5000 mg | ORAL_TABLET | Freq: Every evening | ORAL | 0 refills | Status: DC | PRN

## 2019-11-08 NOTE — Progress Notes (Signed)
Peggy House   [phone] Fax:([phone]) [phone]   Clinic Follow up Note   Patient Care Team: [name], MD as PCP - General (Internal Medicine)  Date of Service:  11/12/2019  CHIEF COMPLAINT: F/uof HHT andAnemia  PREVIOUS THERAPY:  -Multiple EGD, small bowel enteroscope, C-scope with APC -Oral and iv amicar were given during hospital stay, no effect -IR embolization ofof the left gastric and short gastric artery branch vessels without evidence of residual flow in the Dieulafoy lesionon 12/10/17 -Avastin on 8/2 and 8/30 at [facility]; starting 12/26/17 continue 5mg /kg q2 weeksuntil 02/20/18, restarted on 04/03/2018 -Tamoxifen started in 10/2017 for HHTstopped 12/10/18 due toDVT  CURRENT THERAPY: -Blood transfusion for severe anemia secondary to GI bleedingas needed -ivferric gluconate every1-2 weeks as neededwith ferritin goal 100-200. Increased to weekly on 07/29/19. Reduced to every 2 weeks on 11/12/19  -RestartedAvastin q2weeks on 04/03/18. Reduced to every 4 weeks starting 09/25/19 -Amicar 1g bidstarting4/06/2018. Reduced to only as needed on 12/10/18 due toDVT  INTERVAL HISTORY:  Peggy House is here for a follow up of HHT and anemia. She presents to the clinic alone. She notes she feels stable. She denies black or bloody stool recently. She notes having knee pain. She notes she plans to return to her orthopedist. She notes her son moved out of town. She lives alone and has aid who sees her. She notes she plans to set up transportation with [facility].    REVIEW OF SYSTEMS:   Constitutional: Denies fevers, chills or abnormal weight loss Eyes: Denies blurriness of vision Ears, nose, mouth, throat, and face: Denies mucositis or sore throat Respiratory: Denies cough, dyspnea or wheezes Cardiovascular: Denies palpitation, chest discomfort or lower extremity swelling Gastrointestinal:  Denies nausea, heartburn or change in bowel  habits Skin: Denies abnormal skin rashes MSK: (+) Knee pain  Lymphatics: Denies new lymphadenopathy or easy bruising Neurological:Denies numbness, tingling or new weaknesses Behavioral/Psych: Mood is stable, no new changes  All other systems were reviewed with the patient and are negative.  MEDICAL HISTORY:  Past Medical History:  Diagnosis Date  . Anxiety   . Arthritis    knnes,back  . GERD (gastroesophageal reflux disease)   . 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 History of swelling of feet   . Hyperlipidemia   . Hypertension   . Major depressive disorder, recurrent episode ([place]) 06/05/2015  . Obesity   . Snores   . 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 ABDOMINAL HYSTERECTOMY    . CARPAL TUNNEL RELEASE  05/13/2011   Procedure: CARPAL TUNNEL RELEASE;  Surgeon: Nita Sells, MD;  Location: [place];  Service: Orthopedics;  Laterality: Left;  . COLONOSCOPY WITH PROPOFOL N/A 04/28/2014   Procedure: COLONOSCOPY WITH PROPOFOL;  Surgeon: Cleotis Nipper, MD;  Location: [facility] ENDOSCOPY;  Service: Endoscopy;  Laterality: N/A;  . DG TOES*L*  2/10   rt  . DILATION AND CURETTAGE OF UTERUS    . ENTEROSCOPY N/A 10/17/2017   Procedure: ENTEROSCOPY;  Surgeon: Otis Brace, MD;  Location: [place] ENDOSCOPY;  Service: Gastroenterology;  Laterality: N/A;  . ESOPHAGOGASTRODUODENOSCOPY N/A 04/10/2014   Procedure: ESOPHAGOGASTRODUODENOSCOPY (EGD);  Surgeon: [name], MD;  Location: [facility] ENDOSCOPY;  Service: Endoscopy;  Laterality: N/A;  . ESOPHAGOGASTRODUODENOSCOPY N/A 05/10/2017   Procedure: ESOPHAGOGASTRODUODENOSCOPY (EGD);  Surgeon: Ronald Lobo, MD;  Location: [facility] ENDOSCOPY;  Service: Endoscopy;  Laterality: N/A;  .  ESOPHAGOGASTRODUODENOSCOPY N/A 09/22/2017   Procedure: ESOPHAGOGASTRODUODENOSCOPY (EGD);  Surgeon: Clarene Essex, MD;  Location: [place];  Service: Endoscopy;  Laterality: N/A;  bedside  . ESOPHAGOGASTRODUODENOSCOPY (EGD) WITH PROPOFOL N/A 04/27/2014   Procedure: ESOPHAGOGASTRODUODENOSCOPY (EGD) WITH PROPOFOL;  Surgeon: Cleotis Nipper, MD;  Location: [place];  Service: Endoscopy;  Laterality: N/A;  possible apc  . ESOPHAGOGASTRODUODENOSCOPY (EGD) WITH PROPOFOL N/A 09/30/2017   Procedure: ESOPHAGOGASTRODUODENOSCOPY (EGD) WITH PROPOFOL;  Surgeon: [name], MD;  Location: [place];  Service: Gastroenterology;  Laterality: N/A;  . ESOPHAGOGASTRODUODENOSCOPY (EGD) WITH PROPOFOL N/A 10/01/2017   Procedure: ESOPHAGOGASTRODUODENOSCOPY (EGD) WITH PROPOFOL;  Surgeon: [name], MD;  Location: [place];  Service: Gastroenterology;  Laterality: N/A;  . ESOPHAGOGASTRODUODENOSCOPY (EGD) WITH PROPOFOL N/A 10/08/2017   Procedure: ESOPHAGOGASTRODUODENOSCOPY (EGD) WITH PROPOFOL;  Surgeon: Otis Brace, MD;  Location: [facility] ENDOSCOPY;  Service: Gastroenterology;  Laterality: N/A;  . ESOPHAGOGASTRODUODENOSCOPY (EGD) WITH PROPOFOL N/A 10/17/2017   Procedure: ESOPHAGOGASTRODUODENOSCOPY (EGD) WITH PROPOFOL;  Surgeon: Otis Brace, MD;  Location: [facility] ENDOSCOPY;  Service: Gastroenterology;  Laterality: N/A;  . ESOPHAGOGASTRODUODENOSCOPY (EGD) WITH PROPOFOL N/A 10/19/2017   Procedure: ESOPHAGOGASTRODUODENOSCOPY (EGD) WITH PROPOFOL;  Surgeon: Otis Brace, MD;  Location: [facility] ENDOSCOPY;  Service: Gastroenterology;  Laterality: N/A;  . ESOPHAGOGASTRODUODENOSCOPY (EGD) WITH PROPOFOL N/A 12/04/2018   Procedure: ESOPHAGOGASTRODUODENOSCOPY (EGD) WITH PROPOFOL;  Surgeon: Wilford Corner, MD;  Location: [facility] ENDOSCOPY;  Service: Endoscopy;  Laterality: N/A;  . GIVENS CAPSULE STUDY N/A 10/02/2017   Procedure: GIVENS CAPSULE STUDY;  Surgeon: [name], MD;  Location: [place];  Service: Gastroenterology;  Laterality: N/A;  . GIVENS CAPSULE STUDY N/A 10/08/2017   Procedure: GIVENS CAPSULE STUDY;  Surgeon: Otis Brace, MD;   Location: [place];  Service: Gastroenterology;  Laterality: N/A;  endoscopic placement of capsule  . HEMOSTASIS CLIP PLACEMENT  12/04/2018   Procedure: HEMOSTASIS CLIP PLACEMENT;  Surgeon: Wilford Corner, MD;  Location: [facility] ENDOSCOPY;  Service: Endoscopy;;  . HOT HEMOSTASIS N/A 04/27/2014   Procedure: HOT HEMOSTASIS (ARGON PLASMA COAGULATION/BICAP);  Surgeon: Cleotis Nipper, MD;  Location: [facility] ENDOSCOPY;  Service: Endoscopy;  Laterality: N/A;  . HOT HEMOSTASIS N/A 09/30/2017   Procedure: HOT HEMOSTASIS (ARGON PLASMA COAGULATION/BICAP);  Surgeon: [name], MD;  Location: [place];  Service: Gastroenterology;  Laterality: N/A;  . HOT HEMOSTASIS N/A 10/01/2017   Procedure: HOT HEMOSTASIS (ARGON PLASMA COAGULATION/BICAP);  Surgeon: [name], MD;  Location: [place];  Service: Gastroenterology;  Laterality: N/A;  . HOT HEMOSTASIS N/A 10/17/2017   Procedure: HOT HEMOSTASIS (ARGON PLASMA COAGULATION/BICAP);  Surgeon: Otis Brace, MD;  Location: [facility] ENDOSCOPY;  Service: Gastroenterology;  Laterality: N/A;  . HOT HEMOSTASIS N/A 10/19/2017   Procedure: HOT HEMOSTASIS (ARGON PLASMA COAGULATION/BICAP);  Surgeon: Otis Brace, MD;  Location: [facility] ENDOSCOPY;  Service: Gastroenterology;  Laterality: N/A;  . IR IMAGING GUIDED PORT INSERTION  07/08/2018  . L shoulder Surgery  2011  . SUBMUCOSAL INJECTION  09/22/2017   Procedure: SUBMUCOSAL INJECTION;  Surgeon: Clarene Essex, MD;  Location: [facility] ENDOSCOPY;  Service: Endoscopy;;  . SUBMUCOSAL INJECTION  12/04/2018   Procedure: SUBMUCOSAL INJECTION;  Surgeon: Wilford Corner, MD;  Location: [facility] ENDOSCOPY;  Service: Endoscopy;;    I have reviewed the social history and family history with the patient and they are unchanged from previous note.  ALLERGIES:  is allergic to feraheme [ferumoxytol], nsaids, tomato, and wasp venom.  MEDICATIONS:  Current Outpatient Medications  Medication Sig Dispense Refill  . Accu-Chek Softclix Lancets lancets Use  to check blood sugar before breakfast and before dinner while on  steroids 100 each 1  . albuterol (PROVENTIL HFA;VENTOLIN HFA) 108 (90 Base) MCG/ACT inhaler Inhale 1-2 puffs into the lungs every 6 (six) hours as needed for wheezing or shortness of breath. 1 Inhaler 3  . ALPRAZolam (XANAX) 0.5 MG tablet Take 1 tablet (0.5 mg total) by mouth at bedtime as needed for anxiety. 30 tablet 0  . Aminocaproic Acid 1000 MG TABS TAKE 1 TABLET(1000 MG) BY MOUTH TWICE DAILY 60 tablet 1  . amLODipine (NORVASC) 10 MG tablet TAKE 1 TABLET(10 MG) BY MOUTH DAILY 90 tablet 0  . atorvastatin (LIPITOR) 80 MG tablet TAKE 1 TABLET(80 MG) BY MOUTH DAILY (Patient taking differently: Take 80 mg by mouth daily. ) 30 tablet 3  . cetirizine (ZYRTEC) 10 MG tablet TAKE 1 TABLET(10 MG) BY MOUTH DAILY (Patient taking differently: Take 10 mg by mouth daily. ) 90 tablet 1  . cyclobenzaprine (FLEXERIL) 10 MG tablet Take 1 tablet (10 mg total) by mouth 2 (two) times daily as needed for up to 20 doses for muscle spasms. 20 tablet 0  . diphenhydramine-acetaminophen (TYLENOL PM) 25-500 MG TABS tablet Take 1 tablet by mouth at bedtime as needed (sleep).    . diphenoxylate-atropine (LOMOTIL) 2.5-0.025 MG tablet 1 to 2 PO QID prn diarrhea 30 tablet 1  . glucose blood (ACCU-CHEK GUIDE) test strip Check blood sugar 2 times per day while on steroids before breakfast and dinner 50 each 3  . hydrochlorothiazide (HYDRODIURIL) 12.5 MG tablet TAKE 1 TABLET(12.5 MG) BY MOUTH DAILY 90 tablet 1  . lidocaine-prilocaine (EMLA) cream Apply 1 application topically as needed. 30 g 0  . metFORMIN (GLUCOPHAGE) 500 MG tablet Take 1 tablet (500 mg total) by mouth 2 (two) times daily with a meal. 180 tablet 3  . pantoprazole (PROTONIX) 40 MG tablet Take 1 tablet (40 mg total) by mouth 2 (two) times daily. 60 tablet 5  . Podiatric Products (FLEXITOL HEEL BALM) OINT Apply topically.    . potassium chloride (KLOR-CON) 20 MEQ packet Take 20 mEq by mouth daily. 15  packet 0  . prochlorperazine (COMPAZINE) 10 MG tablet Take 1 tablet (10 mg total) by mouth every 6 (six) hours as needed for nausea or vomiting. 30 tablet 1  . sertraline (ZOLOFT) 100 MG tablet TAKE 1 TABLET(100 MG) BY MOUTH DAILY (Patient taking differently: Take 100 mg by mouth daily. ) 90 tablet 1  . traZODone (DESYREL) 50 MG tablet TAKE 1 TABLET(50 MG) BY MOUTH AT BEDTIME (Patient taking differently: Take 50 mg by mouth at bedtime. ) 30 tablet 2   No current facility-administered medications for this visit.    PHYSICAL EXAMINATION: ECOG PERFORMANCE STATUS: 2 - Symptomatic, <50% confined to bed  There were no vitals filed for this visit. There were no vitals filed for this visit.  Due to [place] we will limit examination to appearance. Patient had no complaints.  GENERAL:alert, no distress and comfortable SKIN: skin color normal, no rashes or significant lesions EYES: normal, Conjunctiva are pink and non-injected, sclera clear  NEURO: alert & oriented x 3 with fluent speech   LABORATORY DATA:  I have reviewed the data as listed CBC Latest Ref Rng & Units 11/12/2019 10/28/2019 10/21/2019  WBC 4.0 - 10.5 K/uL 11.0(H) 13.3(H) 11.7(H)  Hemoglobin 12.0 - 15.0 g/dL 10.0(L) 10.1(L) 10.2(L)  Hematocrit 36 - 46 % 33.2(L) 33.8(L) 34.2(L)  Platelets 150 - 400 K/uL 259 286 275     CMP Latest Ref Rng & Units 10/28/2019 10/21/2019 10/14/2019  Glucose 70 - 99  mg/dL 141(H) 104(H) 122(H)  BUN 6 - 20 mg/dL 9 8 8   Creatinine 0.44 - 1.00 mg/dL 0.85 0.84 0.92  Sodium 135 - 145 mmol/L 144 141 140  Potassium 3.5 - 5.1 mmol/L 3.4(L) 3.8 3.7  Chloride 98 - 111 mmol/L 107 104 106  CO2 22 - 32 mmol/L 24 25 22   Calcium 8.9 - 10.3 mg/dL 9.7 9.7 9.3  Total Protein 6.5 - 8.1 g/dL 7.7 7.6 7.8  Total Bilirubin 0.3 - 1.2 mg/dL 0.3 0.3 <0.2(L)  Alkaline Phos 38 - 126 U/L 89 83 84  AST 15 - 41 U/L 17 21 20   ALT 0 - 44 U/L 12 19 19       RADIOGRAPHIC STUDIES: I have personally reviewed the radiological images  as listed and agreed with the findings in the report. No results found.   ASSESSMENT & PLAN:  ADREAN FINDLAY is a 59 y.o. female with    1. 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 Extensive workup found the pt to have HHT based on her personal and familyhistoryand recurrentAVM GI bleedings. -Sheis s/pIR embolization of the left Gastric and short gastric artery branchvesselswithout evidence of residual flow in the Dieulafoy lesionon 12/10/2017. -ShereceivedAvast[facility] and then every 2 weeks9/13/19-11/8/19 -Shewas treated with Tamoxifen and Amicar for her HHT, but due to recent DVT, tamoxifen was stopped and she only takes Amicar as needed for severe epistaxis. -Her primary treatment will now be Avastin and IV Iron ( with Ferric Gluconate weekly unless Ferritin >200)and blood transfusions as needed.We repeatedly discussedconsistency in treatments to manage her blood counts, she voices good understanding. -She is clinically stable lately. Labs reviewed, WBC 11, Hg 10, ANC 8.7. Will proceed with Ferric Gluconate today. Given she has not had recent GI bleeding, will hold Avastin today.   -Given she is stable will reduce Ferric Gluconate unless Ferritin >200 to every 2 weeks. Will reduce Avastin to every 4 weeks due to her proteinuria and she has not been GI bleeding lately.  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 -Although she has not had recent GI bleeding she  still has occasional moderate epistaxis.  -Hg at 10 today, stable. Her iron panel is still pending. Proceed with IV Ferric Gluconate today (11/12/19) -continue Prenatal Vitamin.  3. Reactive Leukocytosis and reactive thrombocytosis  -She has mild leukocytosis with dominant neutrophils, likely reactive -Thrombocytosis recently resolved. Leukocytosis intermittent. Continue monitoring. -WBC and ANC slightly increased, mostly stable. Platelets normal. Stable.   4. 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 -I recommend she continue to follow up with her GI, Dr. Cristina Gong -She had severe GI bleeding which required hospitalization in 11/2018 with several blood transfusions and EGD to cauterize her stomach bleeding. -I started heron Amicar 07/17/18. Given DVT she will only take Amicar for severe epistaxis -Her GI bleeding has resolved currently.But has been havingoccasional mildepistaxis.   5.History of right Arm DVT -She had right arm DVT that extended to her right clavicleon 12/07/2018 when she was  hospitalized for GI bleeding.It is possibly related to Amicar, tamoxifen and port. -She is not a candidate for anticoagulation due to severe GI bleeding -We stopped Tamoxifen, she will only use Amicar for severe epistaxis. Will maintain Port flushes.  6. Smoking cessation -She was smoking 10 cigarettes a day on average before recent hospitalization -I strongly encouraged her to stop smoking completely, she will try.  7. Hypokalemia -Onpotassium chloride 20 mEq daily, continue. Willmonitor -K normal recently.  8. Hyperglycemia, new onset diabetes -Ipreviouslyadvised her to stop eating sweats and reduce her carbohydrate intake.  -She has been on Metformin BIDsince11/5/20 to manage this.Continue to f/u with PCP to monitor herA1c and hyperglycemia. Has improved lately.  9. HTN,  uncontrolled -Her BP has been intermittently elevated in clinic lately.  -I discussed Avastin can increase her BP. Will continue to monitor.  -I strongly encouraged her to keep up with her HTN medications daily to manage this. She understands.  10. Anxiety  -She has been stressed with family health issues (her husband's cancer and son's DM on transplant list). She has been more anxious and started having panic attacks last week. -She is currently on Xanax at 0.5mg  dose to use only as needed.  -I also discussed her speaking with SW for counseling, she is agreeable.   11. B/l LE Edema  -For the past 3 weeks she has had b/l LE edema, L>R with tenderness and pain. She has skin darkening of her ankles to lower calf.  -She was recently seen by PCP who considered this to be related to gout, per pt.  -I recommend Doppler of Left LE to evaluate for blood clot. She did not proceed. -She will continue to elevate her feet and use compression socks    PLAN: -Labs reviewed and adequate to proceed with Ferric Gluconatetoday and will hold avastin.  -Lab, flush, Ferric Gluconate in 2, 4, 6, 8 weeks (holdironif ferritin >200 on last lab)  -Continue Avastin in 2 and 6 weeks  -Continue prenatal Vitamin. -F/u in 8 weeks    No problem-specific Assessment & Plan notes found for this encounter.   No orders of the defined types were placed in this encounter.  All questions were answered. The patient knows to call the clinic with any problems, questions or concerns. No barriers to learning was detected. The total time spent in the appointment was 20 minutes.     Truitt Merle, MD 11/12/2019   I, Joslyn Devon, am acting as scribe for Truitt Merle, MD.   I have reviewed the above documentation for accuracy and completeness, and I agree with the above.

## 2019-11-09 DIAGNOSIS — M1990 Unspecified osteoarthritis, unspecified site: Secondary | ICD-10-CM | POA: Diagnosis not present

## 2019-11-10 ENCOUNTER — Other Ambulatory Visit: Payer: Self-pay | Admitting: Internal Medicine

## 2019-11-10 DIAGNOSIS — M1990 Unspecified osteoarthritis, unspecified site: Secondary | ICD-10-CM | POA: Diagnosis not present

## 2019-11-10 DIAGNOSIS — I10 Essential (primary) hypertension: Secondary | ICD-10-CM

## 2019-11-11 DIAGNOSIS — M1990 Unspecified osteoarthritis, unspecified site: Secondary | ICD-10-CM | POA: Diagnosis not present

## 2019-11-12 ENCOUNTER — Encounter: Payer: Self-pay | Admitting: Hematology

## 2019-11-12 ENCOUNTER — Other Ambulatory Visit: Payer: Self-pay

## 2019-11-12 ENCOUNTER — Inpatient Hospital Stay: Payer: Medicaid Other

## 2019-11-12 ENCOUNTER — Inpatient Hospital Stay (HOSPITAL_BASED_OUTPATIENT_CLINIC_OR_DEPARTMENT_OTHER): Payer: Medicaid Other | Admitting: Hematology

## 2019-11-12 VITALS — BP 157/72 | HR 64 | Temp 98.6°F | Resp 18

## 2019-11-12 DIAGNOSIS — I780 Hereditary hemorrhagic telangiectasia: Secondary | ICD-10-CM

## 2019-11-12 DIAGNOSIS — D500 Iron deficiency anemia secondary to blood loss (chronic): Secondary | ICD-10-CM

## 2019-11-12 DIAGNOSIS — Z5112 Encounter for antineoplastic immunotherapy: Secondary | ICD-10-CM | POA: Diagnosis not present

## 2019-11-12 DIAGNOSIS — Z95828 Presence of other vascular implants and grafts: Secondary | ICD-10-CM

## 2019-11-12 DIAGNOSIS — M1990 Unspecified osteoarthritis, unspecified site: Secondary | ICD-10-CM | POA: Diagnosis not present

## 2019-11-12 LAB — CMP (CANCER CENTER ONLY)
ALT: 6 U/L (ref 0–44)
AST: 13 U/L — ABNORMAL LOW (ref 15–41)
Albumin: 3.5 g/dL (ref 3.5–5.0)
Alkaline Phosphatase: 73 U/L (ref 38–126)
Anion gap: 9 (ref 5–15)
BUN: 6 mg/dL (ref 6–20)
CO2: 26 mmol/L (ref 22–32)
Calcium: 9.7 mg/dL (ref 8.9–10.3)
Chloride: 107 mmol/L (ref 98–111)
Creatinine: 0.76 mg/dL (ref 0.44–1.00)
GFR, Est AFR Am: >60 mL/min (ref >60)
GFR, Estimated: >60 mL/min (ref >60)
Glucose, Bld: 98 mg/dL (ref 70–99)
Potassium: 3.5 mmol/L (ref 3.5–5.1)
Sodium: 142 mmol/L (ref 135–145)
Total Bilirubin: 0.3 mg/dL (ref 0.3–1.2)
Total Protein: 7.4 g/dL (ref 6.5–8.1)

## 2019-11-12 LAB — CBC WITH DIFFERENTIAL (CANCER CENTER ONLY)
Abs Immature Granulocytes: 0.03 10*3/uL (ref 0.00–0.07)
Basophils Absolute: 0 10*3/uL (ref 0.0–0.1)
Basophils Relative: 0 %
Eosinophils Absolute: 0.1 10*3/uL (ref 0.0–0.5)
Eosinophils Relative: 1 %
HCT: 33.2 % — ABNORMAL LOW (ref 36.0–46.0)
Hemoglobin: 10 g/dL — ABNORMAL LOW (ref 12.0–15.0)
Immature Granulocytes: 0 %
Lymphocytes Relative: 15 %
Lymphs Abs: 1.7 10*3/uL (ref 0.7–4.0)
MCH: 24.8 pg — ABNORMAL LOW (ref 26.0–34.0)
MCHC: 30.1 g/dL (ref 30.0–36.0)
MCV: 82.2 fL (ref 80.0–100.0)
Monocytes Absolute: 0.5 10*3/uL (ref 0.1–1.0)
Monocytes Relative: 4 %
Neutro Abs: 8.7 10*3/uL — ABNORMAL HIGH (ref 1.7–7.7)
Neutrophils Relative %: 80 %
Platelet Count: 259 10*3/uL (ref 150–400)
RBC: 4.04 MIL/uL (ref 3.87–5.11)
RDW: 23 % — ABNORMAL HIGH (ref 11.5–15.5)
WBC Count: 11 10*3/uL — ABNORMAL HIGH (ref 4.0–10.5)
nRBC: 0 % (ref 0.0–0.2)

## 2019-11-12 LAB — TOTAL PROTEIN, URINE DIPSTICK: Protein, ur: 300 mg/dL — AB

## 2019-11-12 MED ORDER — SODIUM CHLORIDE 0.9% FLUSH
10.0000 mL | Freq: Once | INTRAVENOUS | Status: AC
  Administered 2019-11-12: 10 mL
  Filled 2019-11-12: qty 10

## 2019-11-12 MED ORDER — SODIUM CHLORIDE 0.9 % IV SOLN
Freq: Once | INTRAVENOUS | Status: AC
  Filled 2019-11-12: qty 250

## 2019-11-12 MED ORDER — HEPARIN SOD (PORK) LOCK FLUSH 100 UNIT/ML IV SOLN
500.0000 [IU] | Freq: Once | INTRAVENOUS | Status: AC
  Administered 2019-11-12: 500 [IU]
  Filled 2019-11-12: qty 5

## 2019-11-12 MED ORDER — SODIUM CHLORIDE 0.9 % IV SOLN
125.0000 mg | Freq: Once | INTRAVENOUS | Status: AC
  Administered 2019-11-12: 125 mg via INTRAVENOUS
  Filled 2019-11-12: qty 10

## 2019-11-12 MED ORDER — SODIUM CHLORIDE 0.9% FLUSH
3.0000 mL | Freq: Once | INTRAVENOUS | Status: DC | PRN
  Filled 2019-11-12: qty 10

## 2019-11-12 MED ORDER — SODIUM CHLORIDE 0.9 % IV SOLN: 5.0000 mg/kg | Freq: Once | INTRAVENOUS | Status: DC

## 2019-11-12 MED ORDER — HEPARIN SOD (PORK) LOCK FLUSH 100 UNIT/ML IV SOLN
250.0000 [IU] | Freq: Once | INTRAVENOUS | Status: DC | PRN
  Filled 2019-11-12: qty 5

## 2019-11-12 NOTE — Patient Instructions (Signed)

## 2019-11-12 NOTE — Patient Instructions (Signed)
Sodium Ferric Gluconate Complex injection What is this medicine? SODIUM FERRIC GLUCONATE COMPLEX (SOE dee um FER ik GLOO koe nate KOM pleks) is an iron replacement. It is used with epoetin therapy to treat low iron levels in patients who are receiving hemodialysis. This medicine may be used for other purposes; ask your health care provider or pharmacist if you have questions. COMMON BRAND NAME(S): Ferrlecit, Nulecit What should I tell my health care provider before I take this medicine? They need to know if you have any of the following conditions:  anemia that is not from iron deficiency  high levels of iron in the body  an unusual or allergic reaction to iron, benzyl alcohol, other medicines, foods, dyes, or preservatives  pregnant or are trying to become pregnant  breast-feeding How should I use this medicine? This medicine is for infusion into a vein. It is given by a health care professional in a hospital or clinic setting. Talk to your pediatrician regarding the use of this medicine in children. While this drug may be prescribed for children as young as 6 years old for selected conditions, precautions do apply. Overdosage: If you think you have taken too much of this medicine contact a poison control center or emergency room at once. NOTE: This medicine is only for you. Do not share this medicine with others. What if I miss a dose? It is important not to miss your dose. Call your doctor or health care professional if you are unable to keep an appointment. What may interact with this medicine? Do not take this medicine with any of the following medications:  deferoxamine  dimercaprol  other iron products This medicine may also interact with the following medications:  chloramphenicol  deferasirox  medicine for blood pressure like enalapril This list may not describe all possible interactions. Give your health care provider a list of all the medicines, herbs,  non-prescription drugs, or dietary supplements you use. Also tell them if you smoke, drink alcohol, or use illegal drugs. Some items may interact with your medicine. What should I watch for while using this medicine? Your condition will be monitored carefully while you are receiving this medicine. Visit your doctor for check-ups as directed. What side effects may I notice from receiving this medicine? Side effects that you should report to your doctor or health care professional as soon as possible:  allergic reactions like skin rash, itching or hives, swelling of the face, lips, or tongue  breathing problems  changes in hearing  changes in vision  chills, flushing, or sweating  fast, irregular heartbeat  feeling faint or lightheaded, falls  fever, flu-like symptoms  high or low blood pressure  pain, tingling, numbness in the hands or feet  severe pain in the chest, back, flanks, or groin  swelling of the ankles, feet, hands  trouble passing urine or change in the amount of urine  unusually weak or tired Side effects that usually do not require medical attention (report to your doctor or health care professional if they continue or are bothersome):  cramps  dark colored stools  diarrhea  headache  nausea, vomiting  stomach upset This list may not describe all possible side effects. Call your doctor for medical advice about side effects. You may report side effects to FDA at 1-800-FDA-1088. Where should I keep my medicine? This drug is given in a hospital or clinic and will not be stored at home. NOTE: This sheet is a summary. It may not cover all   possible information. If you have questions about this medicine, talk to your doctor, pharmacist, or health care provider.  2020 Elsevier/Gold Standard (2007-12-02 15:58:57)  

## 2019-11-13 ENCOUNTER — Encounter: Payer: Self-pay | Admitting: Hematology

## 2019-11-15 DIAGNOSIS — M1990 Unspecified osteoarthritis, unspecified site: Secondary | ICD-10-CM | POA: Diagnosis not present

## 2019-11-15 LAB — IRON AND TIBC
Iron: 34 ug/dL — ABNORMAL LOW (ref 41–142)
Saturation Ratios: 14 % — ABNORMAL LOW (ref 21–57)
TIBC: 238 ug/dL (ref 236–444)
UIBC: 203 ug/dL (ref 120–384)

## 2019-11-15 LAB — FERRITIN: Ferritin: 85 ng/mL (ref 11–307)

## 2019-11-16 DIAGNOSIS — M1990 Unspecified osteoarthritis, unspecified site: Secondary | ICD-10-CM | POA: Diagnosis not present

## 2019-11-17 DIAGNOSIS — M1990 Unspecified osteoarthritis, unspecified site: Secondary | ICD-10-CM | POA: Diagnosis not present

## 2019-11-18 DIAGNOSIS — M1990 Unspecified osteoarthritis, unspecified site: Secondary | ICD-10-CM | POA: Diagnosis not present

## 2019-11-19 DIAGNOSIS — M1990 Unspecified osteoarthritis, unspecified site: Secondary | ICD-10-CM | POA: Diagnosis not present

## 2019-11-22 DIAGNOSIS — M1990 Unspecified osteoarthritis, unspecified site: Secondary | ICD-10-CM | POA: Diagnosis not present

## 2019-11-23 DIAGNOSIS — M1990 Unspecified osteoarthritis, unspecified site: Secondary | ICD-10-CM | POA: Diagnosis not present

## 2019-11-24 DIAGNOSIS — M1990 Unspecified osteoarthritis, unspecified site: Secondary | ICD-10-CM | POA: Diagnosis not present

## 2019-11-25 ENCOUNTER — Inpatient Hospital Stay: Payer: Medicaid Other

## 2019-11-25 ENCOUNTER — Ambulatory Visit: Payer: Medicaid Other

## 2019-11-25 ENCOUNTER — Inpatient Hospital Stay: Payer: Medicaid Other | Attending: Hematology

## 2019-11-25 DIAGNOSIS — I780 Hereditary hemorrhagic telangiectasia: Secondary | ICD-10-CM | POA: Insufficient documentation

## 2019-11-25 DIAGNOSIS — M1990 Unspecified osteoarthritis, unspecified site: Secondary | ICD-10-CM | POA: Diagnosis not present

## 2019-11-25 DIAGNOSIS — K922 Gastrointestinal hemorrhage, unspecified: Secondary | ICD-10-CM | POA: Insufficient documentation

## 2019-11-26 ENCOUNTER — Inpatient Hospital Stay: Payer: Medicaid Other

## 2019-11-26 ENCOUNTER — Other Ambulatory Visit: Payer: Self-pay

## 2019-11-26 DIAGNOSIS — I780 Hereditary hemorrhagic telangiectasia: Secondary | ICD-10-CM | POA: Diagnosis not present

## 2019-11-26 DIAGNOSIS — M1990 Unspecified osteoarthritis, unspecified site: Secondary | ICD-10-CM | POA: Diagnosis not present

## 2019-11-26 DIAGNOSIS — K922 Gastrointestinal hemorrhage, unspecified: Secondary | ICD-10-CM | POA: Diagnosis not present

## 2019-11-26 DIAGNOSIS — D500 Iron deficiency anemia secondary to blood loss (chronic): Secondary | ICD-10-CM

## 2019-11-26 DIAGNOSIS — Z95828 Presence of other vascular implants and grafts: Secondary | ICD-10-CM

## 2019-11-26 LAB — CMP (CANCER CENTER ONLY)
ALT: 10 U/L (ref 0–44)
AST: 18 U/L (ref 15–41)
Albumin: 3.5 g/dL (ref 3.5–5.0)
Alkaline Phosphatase: 83 U/L (ref 38–126)
Anion gap: 12 (ref 5–15)
BUN: 6 mg/dL (ref 6–20)
CO2: 23 mmol/L (ref 22–32)
Calcium: 9.7 mg/dL (ref 8.9–10.3)
Chloride: 108 mmol/L (ref 98–111)
Creatinine: 0.67 mg/dL (ref 0.44–1.00)
GFR, Est AFR Am: >60 mL/min (ref >60)
GFR, Estimated: >60 mL/min (ref >60)
Glucose, Bld: 119 mg/dL — ABNORMAL HIGH (ref 70–99)
Potassium: 3.5 mmol/L (ref 3.5–5.1)
Sodium: 143 mmol/L (ref 135–145)
Total Bilirubin: 0.3 mg/dL (ref 0.3–1.2)
Total Protein: 7.5 g/dL (ref 6.5–8.1)

## 2019-11-26 LAB — CBC WITH DIFFERENTIAL (CANCER CENTER ONLY)
Abs Immature Granulocytes: 0.04 10*3/uL (ref 0.00–0.07)
Basophils Absolute: 0 10*3/uL (ref 0.0–0.1)
Basophils Relative: 0 %
Eosinophils Absolute: 0.2 10*3/uL (ref 0.0–0.5)
Eosinophils Relative: 1 %
HCT: 30.7 % — ABNORMAL LOW (ref 36.0–46.0)
Hemoglobin: 9 g/dL — ABNORMAL LOW (ref 12.0–15.0)
Immature Granulocytes: 0 %
Lymphocytes Relative: 14 %
Lymphs Abs: 1.6 10*3/uL (ref 0.7–4.0)
MCH: 24.1 pg — ABNORMAL LOW (ref 26.0–34.0)
MCHC: 29.3 g/dL — ABNORMAL LOW (ref 30.0–36.0)
MCV: 82.3 fL (ref 80.0–100.0)
Monocytes Absolute: 0.6 10*3/uL (ref 0.1–1.0)
Monocytes Relative: 5 %
Neutro Abs: 9.4 10*3/uL — ABNORMAL HIGH (ref 1.7–7.7)
Neutrophils Relative %: 80 %
Platelet Count: 284 10*3/uL (ref 150–400)
RBC: 3.73 MIL/uL — ABNORMAL LOW (ref 3.87–5.11)
RDW: 22.9 % — ABNORMAL HIGH (ref 11.5–15.5)
WBC Count: 11.9 10*3/uL — ABNORMAL HIGH (ref 4.0–10.5)
nRBC: 0 % (ref 0.0–0.2)

## 2019-11-26 MED ORDER — SODIUM CHLORIDE 0.9% FLUSH
10.0000 mL | Freq: Once | INTRAVENOUS | Status: AC
  Administered 2019-11-26: 10 mL
  Filled 2019-11-26: qty 10

## 2019-11-26 MED ORDER — HEPARIN SOD (PORK) LOCK FLUSH 100 UNIT/ML IV SOLN
500.0000 [IU] | Freq: Once | INTRAVENOUS | Status: AC
  Administered 2019-11-26: 500 [IU]
  Filled 2019-11-26: qty 5

## 2019-11-26 NOTE — Patient Instructions (Signed)

## 2019-11-29 DIAGNOSIS — M1990 Unspecified osteoarthritis, unspecified site: Secondary | ICD-10-CM | POA: Diagnosis not present

## 2019-11-29 LAB — FERRITIN: Ferritin: 62 ng/mL (ref 11–307)

## 2019-11-30 DIAGNOSIS — M1990 Unspecified osteoarthritis, unspecified site: Secondary | ICD-10-CM | POA: Diagnosis not present

## 2019-12-01 DIAGNOSIS — M1990 Unspecified osteoarthritis, unspecified site: Secondary | ICD-10-CM | POA: Diagnosis not present

## 2019-12-02 DIAGNOSIS — M1990 Unspecified osteoarthritis, unspecified site: Secondary | ICD-10-CM | POA: Diagnosis not present

## 2019-12-03 DIAGNOSIS — M1990 Unspecified osteoarthritis, unspecified site: Secondary | ICD-10-CM | POA: Diagnosis not present

## 2019-12-06 DIAGNOSIS — M1990 Unspecified osteoarthritis, unspecified site: Secondary | ICD-10-CM | POA: Diagnosis not present

## 2019-12-07 DIAGNOSIS — M1990 Unspecified osteoarthritis, unspecified site: Secondary | ICD-10-CM | POA: Diagnosis not present

## 2019-12-08 DIAGNOSIS — M1990 Unspecified osteoarthritis, unspecified site: Secondary | ICD-10-CM | POA: Diagnosis not present

## 2019-12-09 ENCOUNTER — Inpatient Hospital Stay: Payer: Medicaid Other

## 2019-12-09 ENCOUNTER — Other Ambulatory Visit: Payer: Medicaid Other

## 2019-12-09 DIAGNOSIS — N319 Neuromuscular dysfunction of bladder, unspecified: Secondary | ICD-10-CM | POA: Diagnosis not present

## 2019-12-09 DIAGNOSIS — N3941 Urge incontinence: Secondary | ICD-10-CM | POA: Diagnosis not present

## 2019-12-09 DIAGNOSIS — M1990 Unspecified osteoarthritis, unspecified site: Secondary | ICD-10-CM | POA: Diagnosis not present

## 2019-12-10 DIAGNOSIS — M1990 Unspecified osteoarthritis, unspecified site: Secondary | ICD-10-CM | POA: Diagnosis not present

## 2019-12-13 DIAGNOSIS — M1990 Unspecified osteoarthritis, unspecified site: Secondary | ICD-10-CM | POA: Diagnosis not present

## 2019-12-14 DIAGNOSIS — M1990 Unspecified osteoarthritis, unspecified site: Secondary | ICD-10-CM | POA: Diagnosis not present

## 2019-12-15 DIAGNOSIS — M1990 Unspecified osteoarthritis, unspecified site: Secondary | ICD-10-CM | POA: Diagnosis not present

## 2019-12-16 ENCOUNTER — Other Ambulatory Visit: Payer: Self-pay | Admitting: Hematology

## 2019-12-16 ENCOUNTER — Other Ambulatory Visit: Payer: Self-pay

## 2019-12-16 DIAGNOSIS — E785 Hyperlipidemia, unspecified: Secondary | ICD-10-CM

## 2019-12-16 DIAGNOSIS — M1990 Unspecified osteoarthritis, unspecified site: Secondary | ICD-10-CM | POA: Diagnosis not present

## 2019-12-17 DIAGNOSIS — M1990 Unspecified osteoarthritis, unspecified site: Secondary | ICD-10-CM | POA: Diagnosis not present

## 2019-12-20 DIAGNOSIS — M1990 Unspecified osteoarthritis, unspecified site: Secondary | ICD-10-CM | POA: Diagnosis not present

## 2019-12-21 DIAGNOSIS — M1990 Unspecified osteoarthritis, unspecified site: Secondary | ICD-10-CM | POA: Diagnosis not present

## 2019-12-22 DIAGNOSIS — M1990 Unspecified osteoarthritis, unspecified site: Secondary | ICD-10-CM | POA: Diagnosis not present

## 2019-12-23 ENCOUNTER — Inpatient Hospital Stay: Payer: Medicaid Other

## 2019-12-23 ENCOUNTER — Inpatient Hospital Stay: Payer: Medicaid Other | Attending: Hematology

## 2019-12-23 ENCOUNTER — Encounter: Payer: Medicaid Other | Admitting: Nutrition

## 2019-12-23 DIAGNOSIS — Z5112 Encounter for antineoplastic immunotherapy: Secondary | ICD-10-CM | POA: Insufficient documentation

## 2019-12-23 DIAGNOSIS — D509 Iron deficiency anemia, unspecified: Secondary | ICD-10-CM | POA: Insufficient documentation

## 2019-12-23 DIAGNOSIS — Z79899 Other long term (current) drug therapy: Secondary | ICD-10-CM | POA: Insufficient documentation

## 2019-12-23 DIAGNOSIS — M1990 Unspecified osteoarthritis, unspecified site: Secondary | ICD-10-CM | POA: Diagnosis not present

## 2019-12-23 DIAGNOSIS — I780 Hereditary hemorrhagic telangiectasia: Secondary | ICD-10-CM | POA: Insufficient documentation

## 2019-12-24 DIAGNOSIS — M1990 Unspecified osteoarthritis, unspecified site: Secondary | ICD-10-CM | POA: Diagnosis not present

## 2019-12-27 DIAGNOSIS — M1990 Unspecified osteoarthritis, unspecified site: Secondary | ICD-10-CM | POA: Diagnosis not present

## 2019-12-28 DIAGNOSIS — M1990 Unspecified osteoarthritis, unspecified site: Secondary | ICD-10-CM | POA: Diagnosis not present

## 2019-12-29 DIAGNOSIS — M1990 Unspecified osteoarthritis, unspecified site: Secondary | ICD-10-CM | POA: Diagnosis not present

## 2019-12-30 ENCOUNTER — Other Ambulatory Visit: Payer: Self-pay | Admitting: Hematology

## 2019-12-30 DIAGNOSIS — M1990 Unspecified osteoarthritis, unspecified site: Secondary | ICD-10-CM | POA: Diagnosis not present

## 2019-12-31 ENCOUNTER — Other Ambulatory Visit: Payer: Self-pay | Admitting: Hematology

## 2019-12-31 DIAGNOSIS — M1990 Unspecified osteoarthritis, unspecified site: Secondary | ICD-10-CM | POA: Diagnosis not present

## 2019-12-31 MED ORDER — ALPRAZOLAM 0.5 MG PO TABS: 0.5000 mg | ORAL_TABLET | Freq: Every evening | ORAL | 0 refills | Status: DC | PRN

## 2020-01-03 DIAGNOSIS — M1990 Unspecified osteoarthritis, unspecified site: Secondary | ICD-10-CM | POA: Diagnosis not present

## 2020-01-04 DIAGNOSIS — M1990 Unspecified osteoarthritis, unspecified site: Secondary | ICD-10-CM | POA: Diagnosis not present

## 2020-01-05 ENCOUNTER — Other Ambulatory Visit: Payer: Self-pay | Admitting: Hematology

## 2020-01-05 DIAGNOSIS — M1990 Unspecified osteoarthritis, unspecified site: Secondary | ICD-10-CM | POA: Diagnosis not present

## 2020-01-05 NOTE — Progress Notes (Signed)
[place]   [phone] [phone]   Clinic Follow up Note   Patient Care Team: [name], MD as PCP - General (Internal Medicine) 01/06/2020  CHIEF COMPLAINT: Follow-up HHT and anemia  PREVIOUS THERAPY:  -Multiple EGD, small bowel enteroscope, C-scope with APC -Oral and iv amicar were given during hospital stay, no effect -IR embolization ofof the left gastric and short gastric artery branch vessels without evidence of residual flow in the Dieulafoy lesionon 12/10/17 -Avastin on 8/2 and 8/30 at [facility]; starting 12/26/17 continue 5mg /kg q2 weeksuntil 02/20/18, restarted on 04/03/2018 -Tamoxifen started in 10/2017 for HHTstopped 12/10/18 due toDVT  CURRENT THERAPY: -Blood transfusion for severe anemia secondary to GI bleedingas needed -ivferric gluconate every1-2 weeks as neededwith ferritin goal 100-200. Increased to weekly on 07/29/19. Reduced to every 2 weeks on 11/12/19 unless ferritin > 200 -RestartedAvastin q2weeks on 04/03/18. Reduced to every 4 weeks starting 09/25/19 -Amicar 1g bidstarting4/06/2018. Reduced to only as needed on 12/10/18 due toDVT  INTERVAL HISTORY: Ms. Quin Hoop returns for follow-up and infusions as scheduled.  Her last Avastin was on 10/28/2019 and iron on 11/12/2019.  She missed appointments in August and early September after her husband was hit by a car and passed away.  Her youngest son plans to move back in with her.  Her appetite remains fair but drinking well.  She is fatigued, out of bed and active most days.  Her mild intermittent nosebleeds are stable at baseline.  She denies recurrent black or bloody stool or signs of GI bleeding.  Her intermittent nausea is ongoing.  Denies recent fever, chills, cough, chest pain, dyspnea.  Leg edema has improved.  She has trouble sleeping which is partly related to left knee pain.    MEDICAL HISTORY:  Past Medical History:  Diagnosis Date  . Anxiety   .  Arthritis    knnes,back  . GERD (gastroesophageal reflux disease)   . 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 History of swelling of feet   . Hyperlipidemia   . Hypertension   . Major depressive disorder, recurrent episode ([employer]) 06/05/2015  . Obesity   . Snores   . 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 ABDOMINAL HYSTERECTOMY    . CARPAL TUNNEL RELEASE  05/13/2011   Procedure: CARPAL TUNNEL RELEASE;  Surgeon: Nita Sells, MD;  Location: [place];  Service: Orthopedics;  Laterality: Left;  . COLONOSCOPY WITH PROPOFOL N/A 04/28/2014   Procedure: COLONOSCOPY WITH PROPOFOL;  Surgeon: Cleotis Nipper, MD;  Location: [facility] ENDOSCOPY;  Service: Endoscopy;  Laterality: N/A;  . DG TOES*L*  2/10   rt  . DILATION AND CURETTAGE OF UTERUS    . ENTEROSCOPY N/A 10/17/2017   Procedure: ENTEROSCOPY;  Surgeon: Otis Brace, MD;  Location: [place] ENDOSCOPY;  Service: Gastroenterology;  Laterality: N/A;  . ESOPHAGOGASTRODUODENOSCOPY N/A 04/10/2014   Procedure: ESOPHAGOGASTRODUODENOSCOPY (EGD);  Surgeon: [name], MD;  Location[facility] ENDOSCOPY;  Service: Endoscopy;  Laterality: N/A;  . ESOPHAGOGASTRODUODENOSCOPY N/A 05/10/2017   Procedure: ESOPHAGOGASTRODUODENOSCOPY (EGD);  Surgeon: Ronald Lobo, MD;  Location: [facility] ENDOSCOPY;  Service: Endoscopy;  Laterality: N/A;  . ESOPHAGOGASTRODUODENOSCOPY N/A 09/22/2017   Procedure: ESOPHAGOGASTRODUODENOSCOPY (EGD);  Surgeon: Clarene Essex, MD;  Location: [place];  Service: Endoscopy;  Laterality: N/A;  bedside  . ESOPHAGOGASTRODUODENOSCOPY (EGD) WITH PROPOFOL N/A 04/27/2014   Procedure: ESOPHAGOGASTRODUODENOSCOPY (EGD) WITH PROPOFOL;  Surgeon: Cleotis Nipper, MD;  Location: [facility] ENDOSCOPY;  Service: Endoscopy;  Laterality: N/A;  possible apc  . ESOPHAGOGASTRODUODENOSCOPY (EGD) WITH PROPOFOL N/A 09/30/2017   Procedure:  ESOPHAGOGASTRODUODENOSCOPY (EGD) WITH PROPOFOL;  Surgeon: [name], MD;  Location: [place];  Service: Gastroenterology;  Laterality: N/A;  . ESOPHAGOGASTRODUODENOSCOPY (EGD) WITH PROPOFOL N/A 10/01/2017   Procedure: ESOPHAGOGASTRODUODENOSCOPY (EGD) WITH PROPOFOL;  Surgeon: [name], MD;  Location: [place];  Service: Gastroenterology;  Laterality: N/A;  . ESOPHAGOGASTRODUODENOSCOPY (EGD) WITH PROPOFOL N/A 10/08/2017   Procedure: ESOPHAGOGASTRODUODENOSCOPY (EGD) WITH PROPOFOL;  Surgeon: Otis Brace, MD;  Location: [facility] ENDOSCOPY;  Service: Gastroenterology;  Laterality: N/A;  . ESOPHAGOGASTRODUODENOSCOPY (EGD) WITH PROPOFOL N/A 10/17/2017   Procedure: ESOPHAGOGASTRODUODENOSCOPY (EGD) WITH PROPOFOL;  Surgeon: Otis Brace, MD;  Location: [facility] ENDOSCOPY;  Service: Gastroenterology;  Laterality: N/A;  . ESOPHAGOGASTRODUODENOSCOPY (EGD) WITH PROPOFOL N/A 10/19/2017   Procedure: ESOPHAGOGASTRODUODENOSCOPY (EGD) WITH PROPOFOL;  Surgeon: Otis Brace, MD;  Location: [facility] ENDOSCOPY;  Service: Gastroenterology;  Laterality: N/A;  . ESOPHAGOGASTRODUODENOSCOPY (EGD) WITH PROPOFOL N/A 12/04/2018   Procedure: ESOPHAGOGASTRODUODENOSCOPY (EGD) WITH PROPOFOL;  Surgeon: Wilford Corner, MD;  Location: [facility] ENDOSCOPY;  Service: Endoscopy;  Laterality: N/A;  . GIVENS CAPSULE STUDY N/A 10/02/2017   Procedure: GIVENS CAPSULE STUDY;  Surgeon: [name], MD;  Location: [place];  Service: Gastroenterology;  Laterality: N/A;  . GIVENS CAPSULE STUDY N/A 10/08/2017   Procedure: GIVENS CAPSULE STUDY;  Surgeon: Otis Brace, MD;  Location: [place];  Service: Gastroenterology;  Laterality: N/A;  endoscopic placement of capsule  . HEMOSTASIS CLIP PLACEMENT  12/04/2018   Procedure: HEMOSTASIS CLIP PLACEMENT;  Surgeon: Wilford Corner, MD;  Location: [facility] ENDOSCOPY;  Service: Endoscopy;;  . HOT HEMOSTASIS N/A 04/27/2014   Procedure: HOT HEMOSTASIS (ARGON PLASMA COAGULATION/BICAP);  Surgeon: Cleotis Nipper, MD;  Location: [facility] ENDOSCOPY;  Service: Endoscopy;  Laterality: N/A;  . HOT HEMOSTASIS N/A 09/30/2017   Procedure: HOT HEMOSTASIS (ARGON PLASMA COAGULATION/BICAP);  Surgeon: [name], MD;  Location: [place];  Service: Gastroenterology;  Laterality: N/A;  . HOT HEMOSTASIS N/A 10/01/2017   Procedure: HOT HEMOSTASIS (ARGON PLASMA COAGULATION/BICAP);  Surgeon: [name], MD;  Location: [place];  Service: Gastroenterology;  Laterality: N/A;  . HOT HEMOSTASIS N/A 10/17/2017   Procedure: HOT HEMOSTASIS (ARGON PLASMA COAGULATION/BICAP);  Surgeon: Otis Brace, MD;  Location: [facility] ENDOSCOPY;  Service: Gastroenterology;  Laterality: N/A;  . HOT HEMOSTASIS N/A 10/19/2017   Procedure: HOT HEMOSTASIS (ARGON PLASMA COAGULATION/BICAP);  Surgeon: Otis Brace, MD;  Location: [facility] ENDOSCOPY;  Service: Gastroenterology;  Laterality: N/A;  . IR IMAGING GUIDED PORT INSERTION  07/08/2018  . L shoulder Surgery  2011  . SUBMUCOSAL INJECTION  09/22/2017   Procedure: SUBMUCOSAL INJECTION;  Surgeon: Clarene Essex, MD;  Location: [facility] ENDOSCOPY;  Service: Endoscopy;;  . SUBMUCOSAL INJECTION  12/04/2018   Procedure: SUBMUCOSAL INJECTION;  Surgeon: Wilford Corner, MD;  Location: [facility] ENDOSCOPY;  Service: Endoscopy;;    I have reviewed the social history and family history with the patient and they are unchanged from previous note.  ALLERGIES:  is allergic to feraheme [ferumoxytol], nsaids, tomato, and wasp venom.  MEDICATIONS:  Current Outpatient Medications  Medication Sig Dispense Refill  . Accu-Chek Softclix Lancets lancets Use to check blood sugar before breakfast and before dinner while on steroids 100 each 1  . albuterol (PROVENTIL HFA;VENTOLIN HFA) 108 (90 Base) MCG/ACT inhaler Inhale 1-2 puffs into the lungs every 6 (six) hours as needed for wheezing or shortness of breath. 1 Inhaler 3  . ALPRAZolam (XANAX) 0.5 MG tablet Take 1 tablet (0.5 mg  total) by mouth at bedtime as needed for anxiety.  30 tablet 0  . Aminocaproic Acid 1000 MG TABS TAKE 1 TABLET(1000 MG) BY MOUTH TWICE DAILY 60 tablet 1  . amLODipine (NORVASC) 10 MG tablet TAKE 1 TABLET(10 MG) BY MOUTH DAILY 90 tablet 0  . atorvastatin (LIPITOR) 80 MG tablet TAKE 1 TABLET(80 MG) BY MOUTH DAILY 30 tablet 3  . cetirizine (ZYRTEC) 10 MG tablet TAKE 1 TABLET(10 MG) BY MOUTH DAILY (Patient taking differently: Take 10 mg by mouth daily. ) 90 tablet 1  . cyclobenzaprine (FLEXERIL) 10 MG tablet Take 1 tablet (10 mg total) by mouth 2 (two) times daily as needed for up to 20 doses for muscle spasms. 20 tablet 0  . diphenhydramine-acetaminophen (TYLENOL PM) 25-500 MG TABS tablet Take 1 tablet by mouth at bedtime as needed (sleep).    . diphenoxylate-atropine (LOMOTIL) 2.5-0.025 MG tablet 1 to 2 PO QID prn diarrhea 30 tablet 1  . glucose blood (ACCU-CHEK GUIDE) test strip Check blood sugar 2 times per day while on steroids before breakfast and dinner 50 each 3  . hydrochlorothiazide (HYDRODIURIL) 12.5 MG tablet TAKE 1 TABLET(12.5 MG) BY MOUTH DAILY 90 tablet 1  . lidocaine-prilocaine (EMLA) cream Apply 1 application topically as needed. 30 g 0  . metFORMIN (GLUCOPHAGE) 500 MG tablet Take 1 tablet (500 mg total) by mouth 2 (two) times daily with a meal. 180 tablet 3  . pantoprazole (PROTONIX) 40 MG tablet Take 1 tablet (40 mg total) by mouth 2 (two) times daily. 60 tablet 5  . Podiatric Products (FLEXITOL HEEL BALM) OINT Apply topically.    . potassium chloride (KLOR-CON) 20 MEQ packet Take 20 mEq by mouth 2 (two) times daily. 30 packet 1  . prochlorperazine (COMPAZINE) 10 MG tablet Take 1 tablet (10 mg total) by mouth every 6 (six) hours as needed for nausea or vomiting. 30 tablet 1  . sertraline (ZOLOFT) 100 MG tablet TAKE 1 TABLET(100 MG) BY MOUTH DAILY (Patient taking differently: Take 100 mg by mouth daily. ) 90 tablet 1  . traZODone (DESYREL) 50 MG tablet TAKE 1 TABLET(50 MG) BY MOUTH AT BEDTIME (Patient taking differently: Take 50 mg by  mouth at bedtime. ) 30 tablet 2   No current facility-administered medications for this visit.   Facility-Administered Medications Ordered in Other Visits  Medication Dose Route Frequency Provider Last Rate Last Admin  . sodium chloride flush (NS) 0.9 % injection 10 mL  10 mL Intracatheter PRN Truitt Merle, MD   10 mL at 01/06/20 1354    PHYSICAL EXAMINATION: ECOG PERFORMANCE STATUS: 2 - Symptomatic, <50% confined to bed  Vitals:   01/06/20 0934  BP: 134/69  Pulse: 65  Resp: 18  Temp: 98.6 F (37 C)  SpO2: 100%   Filed Weights   01/06/20 0934  Weight: 262 lb 4.8 oz (119 kg)    GENERAL:alert, no distress and comfortable SKIN: Without obvious rash EYES: sclera clear LUNGS: clear with normal breathing effort HEART: regularly irregular, mild bilateral lower extremity edema  NEURO: alert & oriented x 3 with fluent speech PAC without erythema  LABORATORY DATA:  I have reviewed the data as listed CBC Latest Ref Rng & Units 01/06/2020 11/26/2019 11/12/2019  WBC 4.0 - 10.5 K/uL 13.5(H) 11.9(H) 11.0(H)  Hemoglobin 12.0 - 15.0 g/dL 7.2(L) 9.0(L) 10.0(L)  Hematocrit 36 - 46 % 24.4(L) 30.7(L) 33.2(L)  Platelets 150 - 400 K/uL 301 284 259     CMP Latest Ref Rng & Units 01/06/2020 11/26/2019  11/12/2019  Glucose 70 - 99 mg/dL 109(H) 119(H) 98  BUN 6 - 20 mg/dL 10 6 6   Creatinine 0.44 - 1.00 mg/dL 0.74 0.67 0.76  Sodium 135 - 145 mmol/L 141 143 142  Potassium 3.5 - 5.1 mmol/L 3.0(LL) 3.5 3.5  Chloride 98 - 111 mmol/L 104 108 107  CO2 22 - 32 mmol/L 28 23 26   Calcium 8.9 - 10.3 mg/dL 9.3 9.7 9.7  Total Protein 6.5 - 8.1 g/dL 7.1 7.5 7.4  Total Bilirubin 0.3 - 1.2 mg/dL 0.2(L) 0.3 0.3  Alkaline Phos 38 - 126 U/L 83 83 73  AST 15 - 41 U/L 10(L) 18 13(L)  ALT 0 - 44 U/L 7 10 6       RADIOGRAPHIC STUDIES: I have personally reviewed the radiological images as listed and agreed with the findings in the report. No results found.   ASSESSMENT & PLAN: LOVELY KERINS a  [id number]   1. 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 Reactive Leukocytosis and reactive thrombocytosis  4. Chronic Epistaxis, h/o recurrent GI Bleeding;[facility] 5. Right arm DVT, found during hospitalization 11/2018 - not a candidate for anticoagulation due to severe GI bleeding 6. HTN, on amlodipine and HCTZ  7. Chronic left knee pain, followed by ortho    Disposition:  Ms. Barno appears stable.  Her treatment plan was recently deescalated to Avastin every 4 weeks and IV ferric gluconate every 2 weeks, however due to the tragic passing of her husband she has not had treatment since 7/15.  She declined social work referral today, she is managing her grief with family support.   She has developed a recurrent moderate anemia, Hgb 7.2.  Denies any signs of active bleeding.  This is likely due to the treatment break.  She is symptomatic with increased fatigue.  she is otherwise doing well.   I am recommending to increase treatment to IV ferric gluconate weekly x4 and Avastin back  to every 2 weeks for now, as long as urine protein remains in parameters.  We will proceed with both infusions today.  Due to her symptomatic anemia she will receive 1 unit pRBCs on 01/08/2020.  She has difficulty sleeping, partly due to chronic left knee pain.  I recommend to follow-up with Ortho.  She can try Benadryl as needed at night.  Follow-up in 4 weeks, or sooner if she develops recurrent/active bleeding or other concerns.   Orders Placed This Encounter  Procedures  . Informed Consent Details: Physician/Practitioner Attestation; Transcribe to consent form and obtain patient signature    Standing Status:   Future    Number of Occurrences:   1    Standing Expiration Date:   01/05/2021    Order Specific Question:  Physician/Practitioner attestation of informed consent for blood and or blood product transfusion    Answer:   I, the physician/practitioner, attest that I have discussed with the patient the benefits, risks, side effects, alternatives, likelihood of achieving goals and potential problems during recovery for the procedure that I have provided informed consent.    Order Specific Question:   Product(s)    Answer:   All Product(s)  . Care order/instruction    Transfuse Parameters    Standing Status:   Future    Number of Occurrences:   1    Standing Expiration Date:   01/05/2021  . Type and screen    Standing Status:   Future    Number of Occurrences:   1    Standing Expiration Date:   01/05/2021   All questions were answered. The patient knows to call the clinic with any problems, questions or concerns. No barriers to learning were detected.     Alla Feeling, NP 01/06/20

## 2020-01-06 ENCOUNTER — Inpatient Hospital Stay: Payer: Medicaid Other

## 2020-01-06 ENCOUNTER — Other Ambulatory Visit: Payer: Self-pay

## 2020-01-06 ENCOUNTER — Inpatient Hospital Stay (HOSPITAL_BASED_OUTPATIENT_CLINIC_OR_DEPARTMENT_OTHER): Payer: Medicaid Other | Admitting: Nurse Practitioner

## 2020-01-06 ENCOUNTER — Telehealth: Payer: Self-pay | Admitting: Nurse Practitioner

## 2020-01-06 ENCOUNTER — Other Ambulatory Visit: Payer: Medicaid Other

## 2020-01-06 ENCOUNTER — Encounter: Payer: Self-pay | Admitting: Nurse Practitioner

## 2020-01-06 VITALS — BP 134/69 | HR 65 | Temp 98.6°F | Resp 18 | Ht 65.0 in | Wt 262.3 lb

## 2020-01-06 DIAGNOSIS — Z95828 Presence of other vascular implants and grafts: Secondary | ICD-10-CM

## 2020-01-06 DIAGNOSIS — Z5112 Encounter for antineoplastic immunotherapy: Secondary | ICD-10-CM | POA: Diagnosis not present

## 2020-01-06 DIAGNOSIS — D500 Iron deficiency anemia secondary to blood loss (chronic): Secondary | ICD-10-CM

## 2020-01-06 DIAGNOSIS — D509 Iron deficiency anemia, unspecified: Secondary | ICD-10-CM | POA: Diagnosis not present

## 2020-01-06 DIAGNOSIS — I780 Hereditary hemorrhagic telangiectasia: Secondary | ICD-10-CM

## 2020-01-06 DIAGNOSIS — M1990 Unspecified osteoarthritis, unspecified site: Secondary | ICD-10-CM | POA: Diagnosis not present

## 2020-01-06 DIAGNOSIS — Z79899 Other long term (current) drug therapy: Secondary | ICD-10-CM | POA: Diagnosis not present

## 2020-01-06 LAB — CBC WITH DIFFERENTIAL (CANCER CENTER ONLY)
Abs Immature Granulocytes: 0.06 10*3/uL (ref 0.00–0.07)
Basophils Absolute: 0.1 10*3/uL (ref 0.0–0.1)
Basophils Relative: 0 %
Eosinophils Absolute: 0.2 10*3/uL (ref 0.0–0.5)
Eosinophils Relative: 1 %
HCT: 24.4 % — ABNORMAL LOW (ref 36.0–46.0)
Hemoglobin: 7.2 g/dL — ABNORMAL LOW (ref 12.0–15.0)
Immature Granulocytes: 0 %
Lymphocytes Relative: 14 %
Lymphs Abs: 1.8 10*3/uL (ref 0.7–4.0)
MCH: 23.2 pg — ABNORMAL LOW (ref 26.0–34.0)
MCHC: 29.5 g/dL — ABNORMAL LOW (ref 30.0–36.0)
MCV: 78.7 fL — ABNORMAL LOW (ref 80.0–100.0)
Monocytes Absolute: 0.6 10*3/uL (ref 0.1–1.0)
Monocytes Relative: 4 %
Neutro Abs: 10.8 10*3/uL — ABNORMAL HIGH (ref 1.7–7.7)
Neutrophils Relative %: 81 %
Platelet Count: 301 10*3/uL (ref 150–400)
RBC: 3.1 MIL/uL — ABNORMAL LOW (ref 3.87–5.11)
RDW: 20.7 % — ABNORMAL HIGH (ref 11.5–15.5)
WBC Count: 13.5 10*3/uL — ABNORMAL HIGH (ref 4.0–10.5)
nRBC: 0 % (ref 0.0–0.2)

## 2020-01-06 LAB — CMP (CANCER CENTER ONLY)
ALT: 7 U/L (ref 0–44)
AST: 10 U/L — ABNORMAL LOW (ref 15–41)
Albumin: 3.3 g/dL — ABNORMAL LOW (ref 3.5–5.0)
Alkaline Phosphatase: 83 U/L (ref 38–126)
Anion gap: 9 (ref 5–15)
BUN: 10 mg/dL (ref 6–20)
CO2: 28 mmol/L (ref 22–32)
Calcium: 9.3 mg/dL (ref 8.9–10.3)
Chloride: 104 mmol/L (ref 98–111)
Creatinine: 0.74 mg/dL (ref 0.44–1.00)
GFR, Est AFR Am: >60 mL/min (ref >60)
GFR, Estimated: >60 mL/min (ref >60)
Glucose, Bld: 109 mg/dL — ABNORMAL HIGH (ref 70–99)
Potassium: 3 mmol/L — CL (ref 3.5–5.1)
Sodium: 141 mmol/L (ref 135–145)
Total Bilirubin: 0.2 mg/dL — ABNORMAL LOW (ref 0.3–1.2)
Total Protein: 7.1 g/dL (ref 6.5–8.1)

## 2020-01-06 LAB — SAMPLE TO BLOOD BANK

## 2020-01-06 LAB — TOTAL PROTEIN, URINE DIPSTICK: Protein, ur: 300 mg/dL — AB

## 2020-01-06 LAB — PREPARE RBC (CROSSMATCH)

## 2020-01-06 LAB — IRON AND TIBC
Iron: 17 ug/dL — ABNORMAL LOW (ref 41–142)
Saturation Ratios: 6 % — ABNORMAL LOW (ref 21–57)
TIBC: 301 ug/dL (ref 236–444)
UIBC: 284 ug/dL (ref 120–384)

## 2020-01-06 LAB — FERRITIN: Ferritin: 8 ng/mL — ABNORMAL LOW (ref 11–307)

## 2020-01-06 MED ORDER — HEPARIN SOD (PORK) LOCK FLUSH 100 UNIT/ML IV SOLN
500.0000 [IU] | Freq: Once | INTRAVENOUS | Status: AC | PRN
  Administered 2020-01-06: 500 [IU]
  Filled 2020-01-06: qty 5

## 2020-01-06 MED ORDER — SODIUM CHLORIDE 0.9% FLUSH
10.0000 mL | INTRAVENOUS | Status: DC | PRN
  Administered 2020-01-06: 10 mL
  Filled 2020-01-06: qty 10

## 2020-01-06 MED ORDER — SODIUM CHLORIDE 0.9 % IV SOLN
125.0000 mg | Freq: Once | INTRAVENOUS | Status: AC
  Administered 2020-01-06: 125 mg via INTRAVENOUS
  Filled 2020-01-06: qty 10

## 2020-01-06 MED ORDER — SODIUM CHLORIDE 0.9 % IV SOLN
Freq: Once | INTRAVENOUS | Status: AC
  Filled 2020-01-06: qty 250

## 2020-01-06 MED ORDER — POTASSIUM CHLORIDE 20 MEQ PO PACK: 20.0000 meq | PACK | Freq: Two times a day (BID) | ORAL | 1 refills | Status: DC

## 2020-01-06 MED ORDER — SODIUM CHLORIDE 0.9% FLUSH
10.0000 mL | Freq: Once | INTRAVENOUS | Status: AC
  Administered 2020-01-06: 10 mL
  Filled 2020-01-06: qty 10

## 2020-01-06 MED ORDER — SODIUM CHLORIDE 0.9 % IV SOLN
5.0000 mg/kg | Freq: Once | INTRAVENOUS | Status: AC
  Administered 2020-01-06: 600 mg via INTRAVENOUS
  Filled 2020-01-06: qty 16

## 2020-01-06 NOTE — Progress Notes (Signed)
Per Cira Rue, NP - okay to proceed with avastin today with elevated urine proteins.

## 2020-01-06 NOTE — Telephone Encounter (Signed)
Scheduled per 9/23 los. Unable to reach pt. Left voicemail with appt times and dates

## 2020-01-06 NOTE — Patient Instructions (Addendum)
[place] Discharge Instructions for Patients Receiving Chemotherapy  Today you received the following chemotherapy agents: Bevacizumab   To help prevent nausea and vomiting after your treatment, we encourage you to take your nausea medication  as prescribed.    If you develop nausea and vomiting that is not controlled by your nausea medication, call the clinic.   BELOW ARE SYMPTOMS THAT SHOULD BE REPORTED IMMEDIATELY:  *FEVER GREATER THAN 100.5 F  *CHILLS WITH OR WITHOUT FEVER  NAUSEA AND VOMITING THAT IS NOT CONTROLLED WITH YOUR NAUSEA MEDICATION  *UNUSUAL SHORTNESS OF BREATH  *UNUSUAL BRUISING OR BLEEDING  TENDERNESS IN MOUTH AND THROAT WITH OR WITHOUT PRESENCE OF ULCERS  *URINARY PROBLEMS  *BOWEL PROBLEMS  UNUSUAL RASH Items with * indicate a potential emergency and should be followed up as soon as possible.  Feel free to call the clinic should you have any questions or concerns. The clinic phone number is [phone].  Please show the [place] at check-in to the Emergency Department and triage nurse.    Sodium Ferric Gluconate Complex injection What is this medicine? SODIUM FERRIC GLUCONATE COMPLEX (SOE dee um FER ik GLOO koe nate KOM pleks) is an iron replacement. It is used with epoetin therapy to treat low iron levels in patients who are receiving hemodialysis. This medicine may be used for other purposes; ask your health care provider or pharmacist if you have questions. COMMON BRAND NAME(S): Ferrlecit, Nulecit What should I tell my health care provider before I take this medicine? They need to know if you have any of the following conditions:  anemia that is not from iron deficiency  high levels of iron in the body  an unusual or allergic reaction to iron, benzyl alcohol, other medicines, foods, dyes, or preservatives  pregnant or are trying to become pregnant  breast-feeding How should I use this medicine? This medicine is  for infusion into a vein. It is given by a health care professional in a hospital or clinic setting. Talk to your pediatrician regarding the use of this medicine in children. While this drug may be prescribed for children as young as 99 years old for selected conditions, precautions do apply. Overdosage: If you think you have taken too much of this medicine contact a poison control center or emergency room at once. NOTE: This medicine is only for you. Do not share this medicine with others. What if I miss a dose? It is important not to miss your dose. Call your doctor or health care professional if you are unable to keep an appointment. What may interact with this medicine? Do not take this medicine with any of the following medications:  deferoxamine  dimercaprol  other iron products This medicine may also interact with the following medications:  chloramphenicol  deferasirox  medicine for blood pressure like enalapril This list may not describe all possible interactions. Give your health care provider a list of all the medicines, herbs, non-prescription drugs, or dietary supplements you use. Also tell them if you smoke, drink alcohol, or use illegal drugs. Some items may interact with your medicine. What should I watch for while using this medicine? Your condition will be monitored carefully while you are receiving this medicine. Visit your doctor for check-ups as directed. What side effects may I notice from receiving this medicine? Side effects that you should report to your doctor or health care professional as soon as possible:  allergic reactions like skin rash, itching or hives, swelling of  the face, lips, or tongue  breathing problems  changes in hearing  changes in vision  chills, flushing, or sweating  fast, irregular heartbeat  feeling faint or lightheaded, falls  fever, flu-like symptoms  high or low blood pressure  pain, tingling, numbness in the hands or  feet  severe pain in the chest, back, flanks, or groin  swelling of the ankles, feet, hands  trouble passing urine or change in the amount of urine  unusually weak or tired Side effects that usually do not require medical attention (report to your doctor or health care professional if they continue or are bothersome):  cramps  dark colored stools  diarrhea  headache  nausea, vomiting  stomach upset This list may not describe all possible side effects. Call your doctor for medical advice about side effects. You may report side effects to FDA at 1-800-FDA-1088. Where should I keep my medicine? This drug is given in a hospital or clinic and will not be stored at home. NOTE: This sheet is a summary. It may not cover all possible information. If you have questions about this medicine, talk to your doctor, pharmacist, or health care provider.  2020 Elsevier/Gold Standard (2007-12-02 15:58:57)

## 2020-01-06 NOTE — Patient Instructions (Signed)

## 2020-01-06 NOTE — Progress Notes (Signed)
Critical Value: K 3.0 Cira Rue, NP notified

## 2020-01-07 DIAGNOSIS — M1990 Unspecified osteoarthritis, unspecified site: Secondary | ICD-10-CM | POA: Diagnosis not present

## 2020-01-08 ENCOUNTER — Inpatient Hospital Stay: Payer: Medicaid Other

## 2020-01-08 ENCOUNTER — Other Ambulatory Visit: Payer: Self-pay

## 2020-01-08 DIAGNOSIS — Z5112 Encounter for antineoplastic immunotherapy: Secondary | ICD-10-CM | POA: Diagnosis not present

## 2020-01-08 DIAGNOSIS — D500 Iron deficiency anemia secondary to blood loss (chronic): Secondary | ICD-10-CM

## 2020-01-08 MED ORDER — HEPARIN SOD (PORK) LOCK FLUSH 100 UNIT/ML IV SOLN
500.0000 [IU] | Freq: Every day | INTRAVENOUS | Status: AC | PRN
  Administered 2020-01-08: 500 [IU]
  Filled 2020-01-08: qty 5

## 2020-01-08 MED ORDER — SODIUM CHLORIDE 0.9% FLUSH
3.0000 mL | INTRAVENOUS | Status: DC | PRN
  Filled 2020-01-08: qty 10

## 2020-01-08 MED ORDER — DIPHENHYDRAMINE HCL 25 MG PO CAPS
25.0000 mg | ORAL_CAPSULE | Freq: Once | ORAL | Status: AC
  Administered 2020-01-08: 25 mg via ORAL

## 2020-01-08 MED ORDER — SODIUM CHLORIDE 0.9% IV SOLUTION
250.0000 mL | Freq: Once | INTRAVENOUS | Status: AC
  Administered 2020-01-08: 250 mL via INTRAVENOUS
  Filled 2020-01-08: qty 250

## 2020-01-08 MED ORDER — SODIUM CHLORIDE 0.9% FLUSH
10.0000 mL | INTRAVENOUS | Status: AC | PRN
  Administered 2020-01-08: 10 mL
  Filled 2020-01-08: qty 10

## 2020-01-08 MED ORDER — ACETAMINOPHEN 325 MG PO TABS
ORAL_TABLET | ORAL | Status: AC
  Filled 2020-01-08: qty 2

## 2020-01-08 MED ORDER — ACETAMINOPHEN 325 MG PO TABS
650.0000 mg | ORAL_TABLET | Freq: Once | ORAL | Status: AC
  Administered 2020-01-08: 650 mg via ORAL

## 2020-01-08 MED ORDER — DIPHENHYDRAMINE HCL 25 MG PO CAPS
ORAL_CAPSULE | ORAL | Status: AC
  Filled 2020-01-08: qty 1

## 2020-01-08 NOTE — Patient Instructions (Signed)
https://www.[facility].org/donate-blood/blood-donation-process/what-happens-to-donated-blood/blood-transfusions/types-of-[email or web address].html"> https://www.[facility].org/donate-blood/blood-donation-process/what-happens-to-donated-blood/blood-transfusions/risks-complications.html">  Blood Transfusion, Adult, Care After This sheet gives you information about how to care for yourself after your procedure. Your health care provider may also give you more specific instructions. If you have problems or questions, contact your health care provider. What can I expect after the procedure? After the procedure, it is common to have:  Bruising and soreness where the IV was inserted.  A fever or chills on the day of the procedure. This may be your body's response to the new blood cells received.  A headache. Follow these instructions at home: IV insertion site care      Follow instructions from your health care provider about how to take care of your IV insertion site. Make sure you: ? Wash your hands with soap and water before and after you change your bandage (dressing). If soap and water are not available, use hand sanitizer. ? Change your dressing as told by your health care provider.  Check your IV insertion site every day for signs of infection. Check for: ? Redness, swelling, or pain. ? Bleeding from the site. ? Warmth. ? Pus or a bad smell. General instructions  Take over-the-counter and prescription medicines only as told by your health care provider.  Rest as told by your health care provider.  Return to your normal activities as told by your health care provider.  Keep all follow-up visits as told by your health care provider. This is important. Contact a health care provider if:  You have itching or red, swollen areas of skin (hives).  You feel anxious.  You feel weak after doing your normal activities.  You have redness, swelling, warmth, or pain around the IV  insertion site.  You have blood coming from the IV insertion site that does not stop with pressure.  You have pus or a bad smell coming from your IV insertion site. Get help right away if:  You have symptoms of a serious allergic or immune system reaction, including: ? Trouble breathing or shortness of breath. ? Swelling of the face or feeling flushed. ? Fever or chills. ? Pain in the head, back, or chest. ? Dark urine or blood in the urine. ? Widespread rash. ? Fast heartbeat. ? Feeling dizzy or light-headed. If you receive your blood transfusion in an outpatient setting, you will be told whom to contact to report any reactions. These symptoms may represent a serious problem that is an emergency. Do not wait to see if the symptoms will go away. Get medical help right away. Call your local emergency services (911 in the [place] Do not drive yourself to the hospital. Summary  Bruising and tenderness around the IV insertion site are common.  Check your IV insertion site every day for signs of infection.  Rest as told by your health care provider. Return to your normal activities as told by your health care provider.  Get help right away for symptoms of a serious allergic or immune system reaction to blood transfusion. This information is not intended to replace advice given to you by your health care provider. Make sure you discuss any questions you have with your health care provider. Document Revised: 09/24/2018 Document Reviewed: 09/24/2018 Elsevier Patient Education  2020 Elsevier Inc.  

## 2020-01-09 LAB — TYPE AND SCREEN
ABO/RH(D): A NEG
Antibody Screen: NEGATIVE
Unit division: 0

## 2020-01-09 LAB — BPAM RBC
Blood Product Expiration Date: 202110032359
ISSUE DATE / TIME: 202109251149
Unit Type and Rh: 600

## 2020-01-10 DIAGNOSIS — M1990 Unspecified osteoarthritis, unspecified site: Secondary | ICD-10-CM | POA: Diagnosis not present

## 2020-01-11 DIAGNOSIS — M1990 Unspecified osteoarthritis, unspecified site: Secondary | ICD-10-CM | POA: Diagnosis not present

## 2020-01-12 ENCOUNTER — Inpatient Hospital Stay: Payer: Medicaid Other

## 2020-01-12 DIAGNOSIS — M1990 Unspecified osteoarthritis, unspecified site: Secondary | ICD-10-CM | POA: Diagnosis not present

## 2020-01-13 DIAGNOSIS — M1990 Unspecified osteoarthritis, unspecified site: Secondary | ICD-10-CM | POA: Diagnosis not present

## 2020-01-14 DIAGNOSIS — M1990 Unspecified osteoarthritis, unspecified site: Secondary | ICD-10-CM | POA: Diagnosis not present

## 2020-01-17 DIAGNOSIS — M1990 Unspecified osteoarthritis, unspecified site: Secondary | ICD-10-CM | POA: Diagnosis not present

## 2020-01-18 ENCOUNTER — Ambulatory Visit: Payer: Medicaid Other | Admitting: Podiatry

## 2020-01-18 DIAGNOSIS — M1990 Unspecified osteoarthritis, unspecified site: Secondary | ICD-10-CM | POA: Diagnosis not present

## 2020-01-19 DIAGNOSIS — M1990 Unspecified osteoarthritis, unspecified site: Secondary | ICD-10-CM | POA: Diagnosis not present

## 2020-01-20 ENCOUNTER — Inpatient Hospital Stay: Payer: Medicaid Other | Attending: Hematology

## 2020-01-20 ENCOUNTER — Telehealth: Payer: Self-pay

## 2020-01-20 ENCOUNTER — Other Ambulatory Visit: Payer: Medicaid Other

## 2020-01-20 ENCOUNTER — Inpatient Hospital Stay: Payer: Medicaid Other

## 2020-01-20 ENCOUNTER — Ambulatory Visit: Payer: Medicaid Other | Admitting: Hematology

## 2020-01-20 ENCOUNTER — Other Ambulatory Visit: Payer: Self-pay

## 2020-01-20 VITALS — BP 169/75 | HR 71 | Temp 98.0°F | Resp 20

## 2020-01-20 DIAGNOSIS — D500 Iron deficiency anemia secondary to blood loss (chronic): Secondary | ICD-10-CM

## 2020-01-20 DIAGNOSIS — N319 Neuromuscular dysfunction of bladder, unspecified: Secondary | ICD-10-CM | POA: Diagnosis not present

## 2020-01-20 DIAGNOSIS — I780 Hereditary hemorrhagic telangiectasia: Secondary | ICD-10-CM | POA: Insufficient documentation

## 2020-01-20 DIAGNOSIS — Z95828 Presence of other vascular implants and grafts: Secondary | ICD-10-CM

## 2020-01-20 DIAGNOSIS — N3941 Urge incontinence: Secondary | ICD-10-CM | POA: Diagnosis not present

## 2020-01-20 DIAGNOSIS — E876 Hypokalemia: Secondary | ICD-10-CM

## 2020-01-20 DIAGNOSIS — M1990 Unspecified osteoarthritis, unspecified site: Secondary | ICD-10-CM | POA: Diagnosis not present

## 2020-01-20 LAB — CBC WITH DIFFERENTIAL (CANCER CENTER ONLY)
Abs Immature Granulocytes: 0.05 10*3/uL (ref 0.00–0.07)
Basophils Absolute: 0 10*3/uL (ref 0.0–0.1)
Basophils Relative: 0 %
Eosinophils Absolute: 0.1 10*3/uL (ref 0.0–0.5)
Eosinophils Relative: 1 %
HCT: 28.9 % — ABNORMAL LOW (ref 36.0–46.0)
Hemoglobin: 8.4 g/dL — ABNORMAL LOW (ref 12.0–15.0)
Immature Granulocytes: 0 %
Lymphocytes Relative: 15 %
Lymphs Abs: 1.9 10*3/uL (ref 0.7–4.0)
MCH: 23 pg — ABNORMAL LOW (ref 26.0–34.0)
MCHC: 29.1 g/dL — ABNORMAL LOW (ref 30.0–36.0)
MCV: 79.2 fL — ABNORMAL LOW (ref 80.0–100.0)
Monocytes Absolute: 0.6 10*3/uL (ref 0.1–1.0)
Monocytes Relative: 5 %
Neutro Abs: 10 10*3/uL — ABNORMAL HIGH (ref 1.7–7.7)
Neutrophils Relative %: 79 %
Platelet Count: 309 10*3/uL (ref 150–400)
RBC: 3.65 MIL/uL — ABNORMAL LOW (ref 3.87–5.11)
RDW: 21.3 % — ABNORMAL HIGH (ref 11.5–15.5)
WBC Count: 12.7 10*3/uL — ABNORMAL HIGH (ref 4.0–10.5)
nRBC: 0 % (ref 0.0–0.2)

## 2020-01-20 LAB — CMP (CANCER CENTER ONLY)
ALT: 7 U/L (ref 0–44)
AST: 11 U/L — ABNORMAL LOW (ref 15–41)
Albumin: 3.2 g/dL — ABNORMAL LOW (ref 3.5–5.0)
Alkaline Phosphatase: 91 U/L (ref 38–126)
Anion gap: 7 (ref 5–15)
BUN: 6 mg/dL (ref 6–20)
CO2: 30 mmol/L (ref 22–32)
Calcium: 9.2 mg/dL (ref 8.9–10.3)
Chloride: 105 mmol/L (ref 98–111)
Creatinine: 0.72 mg/dL (ref 0.44–1.00)
GFR, Estimated: >60 mL/min (ref >60)
Glucose, Bld: 118 mg/dL — ABNORMAL HIGH (ref 70–99)
Potassium: 3 mmol/L — CL (ref 3.5–5.1)
Sodium: 142 mmol/L (ref 135–145)
Total Bilirubin: 0.3 mg/dL (ref 0.3–1.2)
Total Protein: 7.2 g/dL (ref 6.5–8.1)

## 2020-01-20 LAB — FERRITIN: Ferritin: 27 ng/mL (ref 11–307)

## 2020-01-20 MED ORDER — SODIUM CHLORIDE 0.9% FLUSH
10.0000 mL | Freq: Once | INTRAVENOUS | Status: AC
  Administered 2020-01-20: 10 mL
  Filled 2020-01-20: qty 10

## 2020-01-20 MED ORDER — POTASSIUM CHLORIDE CRYS ER 20 MEQ PO TBCR
EXTENDED_RELEASE_TABLET | ORAL | Status: AC
  Filled 2020-01-20: qty 2

## 2020-01-20 MED ORDER — SODIUM CHLORIDE 0.9 % IV SOLN
Freq: Once | INTRAVENOUS | Status: AC
  Filled 2020-01-20: qty 250

## 2020-01-20 MED ORDER — POTASSIUM CHLORIDE CRYS ER 20 MEQ PO TBCR
40.0000 meq | EXTENDED_RELEASE_TABLET | Freq: Once | ORAL | Status: AC
  Administered 2020-01-20: 40 meq via ORAL

## 2020-01-20 MED ORDER — SODIUM CHLORIDE 0.9 % IV SOLN
250.0000 mg | Freq: Once | INTRAVENOUS | Status: AC
  Administered 2020-01-20: 250 mg via INTRAVENOUS
  Filled 2020-01-20: qty 20

## 2020-01-20 MED ORDER — SODIUM CHLORIDE 0.9 % IV SOLN
250.0000 mg | Freq: Once | INTRAVENOUS | Status: DC
  Administered 2020-01-20: 250 mg via INTRAVENOUS
  Filled 2020-01-20: qty 20

## 2020-01-20 NOTE — Patient Instructions (Signed)

## 2020-01-20 NOTE — Progress Notes (Signed)
Ok per MD Burr Medico for pt to not receive avastin today d/t time constraints.  Pt receiving oral potassium during iron infusion.

## 2020-01-20 NOTE — Patient Instructions (Signed)
Sodium Ferric Gluconate Complex injection What is this medicine? SODIUM FERRIC GLUCONATE COMPLEX (SOE dee um FER ik GLOO koe nate KOM pleks) is an iron replacement. It is used with epoetin therapy to treat low iron levels in patients who are receiving hemodialysis. This medicine may be used for other purposes; ask your health care provider or pharmacist if you have questions. COMMON BRAND NAME(S): Ferrlecit, Nulecit What should I tell my health care provider before I take this medicine? They need to know if you have any of the following conditions:  anemia that is not from iron deficiency  high levels of iron in the body  an unusual or allergic reaction to iron, benzyl alcohol, other medicines, foods, dyes, or preservatives  pregnant or are trying to become pregnant  breast-feeding How should I use this medicine? This medicine is for infusion into a vein. It is given by a health care professional in a hospital or clinic setting. Talk to your pediatrician regarding the use of this medicine in children. While this drug may be prescribed for children as young as 6 years old for selected conditions, precautions do apply. Overdosage: If you think you have taken too much of this medicine contact a poison control center or emergency room at once. NOTE: This medicine is only for you. Do not share this medicine with others. What if I miss a dose? It is important not to miss your dose. Call your doctor or health care professional if you are unable to keep an appointment. What may interact with this medicine? Do not take this medicine with any of the following medications:  deferoxamine  dimercaprol  other iron products This medicine may also interact with the following medications:  chloramphenicol  deferasirox  medicine for blood pressure like enalapril This list may not describe all possible interactions. Give your health care provider a list of all the medicines, herbs,  non-prescription drugs, or dietary supplements you use. Also tell them if you smoke, drink alcohol, or use illegal drugs. Some items may interact with your medicine. What should I watch for while using this medicine? Your condition will be monitored carefully while you are receiving this medicine. Visit your doctor for check-ups as directed. What side effects may I notice from receiving this medicine? Side effects that you should report to your doctor or health care professional as soon as possible:  allergic reactions like skin rash, itching or hives, swelling of the face, lips, or tongue  breathing problems  changes in hearing  changes in vision  chills, flushing, or sweating  fast, irregular heartbeat  feeling faint or lightheaded, falls  fever, flu-like symptoms  high or low blood pressure  pain, tingling, numbness in the hands or feet  severe pain in the chest, back, flanks, or groin  swelling of the ankles, feet, hands  trouble passing urine or change in the amount of urine  unusually weak or tired Side effects that usually do not require medical attention (report to your doctor or health care professional if they continue or are bothersome):  cramps  dark colored stools  diarrhea  headache  nausea, vomiting  stomach upset This list may not describe all possible side effects. Call your doctor for medical advice about side effects. You may report side effects to FDA at 1-800-FDA-1088. Where should I keep my medicine? This drug is given in a hospital or clinic and will not be stored at home. NOTE: This sheet is a summary. It may not cover all   possible information. If you have questions about this medicine, talk to your doctor, pharmacist, or health care provider.  2020 Elsevier/Gold Standard (2007-12-02 15:58:57)     Hypokalemia Hypokalemia means that the amount of potassium in the blood is lower than normal. Potassium is a chemical (electrolyte) that  helps regulate the amount of fluid in the body. It also stimulates muscle tightening (contraction) and helps nerves work properly. Normally, most of the body's potassium is inside cells, and only a very small amount is in the blood. Because the amount in the blood is so small, minor changes to potassium levels in the blood can be life-threatening. What are the causes? This condition may be caused by:  Antibiotic medicine.  Diarrhea or vomiting. Taking too much of a medicine that helps you have a bowel movement (laxative) can cause diarrhea and lead to hypokalemia.  Chronic kidney disease (CKD).  Medicines that help the body get rid of excess fluid (diuretics).  Eating disorders, such as bulimia.  Low magnesium levels in the body.  Sweating a lot. What are the signs or symptoms? Symptoms of this condition include:  Weakness.  Constipation.  Fatigue.  Muscle cramps.  Mental confusion.  Skipped heartbeats or irregular heartbeat (palpitations).  Tingling or numbness. How is this diagnosed? This condition is diagnosed with a blood test. How is this treated? This condition may be treated by:  Taking potassium supplements by mouth.  Adjusting the medicines that you take.  Eating more foods that contain a lot of potassium. If your potassium level is very low, you may need to get potassium through an IV and be monitored in the hospital. Follow these instructions at home:   Take over-the-counter and prescription medicines only as told by your health care provider. This includes vitamins and supplements.  Eat a healthy diet. A healthy diet includes fresh fruits and vegetables, whole grains, healthy fats, and lean proteins.  If instructed, eat more foods that contain a lot of potassium. This includes: ? Nuts, such as peanuts and pistachios. ? Seeds, such as sunflower seeds and pumpkin seeds. ? Peas, lentils, and lima beans. ? Whole grain and bran cereals and  breads. ? Fresh fruits and vegetables, such as apricots, avocado, bananas, cantaloupe, kiwi, oranges, tomatoes, asparagus, and potatoes. ? Orange juice. ? Tomato juice. ? Red meats. ? Yogurt.  Keep all follow-up visits as told by your health care provider. This is important. Contact a health care provider if you:  Have weakness that gets worse.  Feel your heart pounding or racing.  Vomit.  Have diarrhea.  Have diabetes (diabetes mellitus) and you have trouble keeping your blood sugar (glucose) in your target range. Get help right away if you:  Have chest pain.  Have shortness of breath.  Have vomiting or diarrhea that lasts for more than 2 days.  Faint. Summary  Hypokalemia means that the amount of potassium in the blood is lower than normal.  This condition is diagnosed with a blood test.  Hypokalemia may be treated by taking potassium supplements, adjusting the medicines that you take, or eating more foods that are high in potassium.  If your potassium level is very low, you may need to get potassium through an IV and be monitored in the hospital. This information is not intended to replace advice given to you by your health care provider. Make sure you discuss any questions you have with your health care provider. Document Revised: 11/12/2017 Document Reviewed: 11/12/2017 Elsevier Patient Education  2020 Elsevier   Inc. ° °

## 2020-01-20 NOTE — Telephone Encounter (Signed)
Critical potassium level reported to Dr. Burr Medico.

## 2020-01-21 ENCOUNTER — Ambulatory Visit: Payer: Medicaid Other | Admitting: Podiatry

## 2020-01-21 DIAGNOSIS — M1990 Unspecified osteoarthritis, unspecified site: Secondary | ICD-10-CM | POA: Diagnosis not present

## 2020-01-24 ENCOUNTER — Other Ambulatory Visit: Payer: Self-pay

## 2020-01-24 DIAGNOSIS — I780 Hereditary hemorrhagic telangiectasia: Secondary | ICD-10-CM

## 2020-01-24 DIAGNOSIS — M1990 Unspecified osteoarthritis, unspecified site: Secondary | ICD-10-CM | POA: Diagnosis not present

## 2020-01-24 DIAGNOSIS — R808 Other proteinuria: Secondary | ICD-10-CM

## 2020-01-25 DIAGNOSIS — F17210 Nicotine dependence, cigarettes, uncomplicated: Secondary | ICD-10-CM | POA: Diagnosis not present

## 2020-01-25 DIAGNOSIS — M25562 Pain in left knee: Secondary | ICD-10-CM | POA: Diagnosis not present

## 2020-01-25 DIAGNOSIS — M1990 Unspecified osteoarthritis, unspecified site: Secondary | ICD-10-CM | POA: Diagnosis not present

## 2020-01-25 DIAGNOSIS — Z6841 Body Mass Index (BMI) 40.0 and over, adult: Secondary | ICD-10-CM | POA: Diagnosis not present

## 2020-01-26 DIAGNOSIS — M1990 Unspecified osteoarthritis, unspecified site: Secondary | ICD-10-CM | POA: Diagnosis not present

## 2020-01-27 ENCOUNTER — Other Ambulatory Visit: Payer: Self-pay

## 2020-01-27 ENCOUNTER — Inpatient Hospital Stay: Payer: Medicaid Other

## 2020-01-27 VITALS — BP 165/90 | HR 72 | Temp 98.8°F | Resp 18

## 2020-01-27 DIAGNOSIS — I780 Hereditary hemorrhagic telangiectasia: Secondary | ICD-10-CM | POA: Diagnosis not present

## 2020-01-27 DIAGNOSIS — D500 Iron deficiency anemia secondary to blood loss (chronic): Secondary | ICD-10-CM

## 2020-01-27 DIAGNOSIS — Z95828 Presence of other vascular implants and grafts: Secondary | ICD-10-CM

## 2020-01-27 DIAGNOSIS — M1990 Unspecified osteoarthritis, unspecified site: Secondary | ICD-10-CM | POA: Diagnosis not present

## 2020-01-27 MED ORDER — SODIUM CHLORIDE 0.9% FLUSH
10.0000 mL | Freq: Once | INTRAVENOUS | Status: AC
  Administered 2020-01-27: 10 mL
  Filled 2020-01-27: qty 10

## 2020-01-27 MED ORDER — HEPARIN SOD (PORK) LOCK FLUSH 100 UNIT/ML IV SOLN
500.0000 [IU] | Freq: Once | INTRAVENOUS | Status: AC
  Administered 2020-01-27: 500 [IU]
  Filled 2020-01-27: qty 5

## 2020-01-27 MED ORDER — SODIUM CHLORIDE 0.9 % IV SOLN
250.0000 mg | Freq: Once | INTRAVENOUS | Status: AC
  Administered 2020-01-27: 250 mg via INTRAVENOUS
  Filled 2020-01-27: qty 20

## 2020-01-27 MED ORDER — SODIUM CHLORIDE 0.9 % IV SOLN
INTRAVENOUS | Status: DC
  Filled 2020-01-27: qty 250

## 2020-01-27 NOTE — Patient Instructions (Signed)
Sodium Ferric Gluconate Complex injection What is this medicine? SODIUM FERRIC GLUCONATE COMPLEX (SOE dee um FER ik GLOO koe nate KOM pleks) is an iron replacement. It is used with epoetin therapy to treat low iron levels in patients who are receiving hemodialysis. This medicine may be used for other purposes; ask your health care provider or pharmacist if you have questions. COMMON BRAND NAME(S): Ferrlecit, Nulecit What should I tell my health care provider before I take this medicine? They need to know if you have any of the following conditions:  anemia that is not from iron deficiency  high levels of iron in the body  an unusual or allergic reaction to iron, benzyl alcohol, other medicines, foods, dyes, or preservatives  pregnant or are trying to become pregnant  breast-feeding How should I use this medicine? This medicine is for infusion into a vein. It is given by a health care professional in a hospital or clinic setting. Talk to your pediatrician regarding the use of this medicine in children. While this drug may be prescribed for children as young as 6 years old for selected conditions, precautions do apply. Overdosage: If you think you have taken too much of this medicine contact a poison control center or emergency room at once. NOTE: This medicine is only for you. Do not share this medicine with others. What if I miss a dose? It is important not to miss your dose. Call your doctor or health care professional if you are unable to keep an appointment. What may interact with this medicine? Do not take this medicine with any of the following medications:  deferoxamine  dimercaprol  other iron products This medicine may also interact with the following medications:  chloramphenicol  deferasirox  medicine for blood pressure like enalapril This list may not describe all possible interactions. Give your health care provider a list of all the medicines, herbs,  non-prescription drugs, or dietary supplements you use. Also tell them if you smoke, drink alcohol, or use illegal drugs. Some items may interact with your medicine. What should I watch for while using this medicine? Your condition will be monitored carefully while you are receiving this medicine. Visit your doctor for check-ups as directed. What side effects may I notice from receiving this medicine? Side effects that you should report to your doctor or health care professional as soon as possible:  allergic reactions like skin rash, itching or hives, swelling of the face, lips, or tongue  breathing problems  changes in hearing  changes in vision  chills, flushing, or sweating  fast, irregular heartbeat  feeling faint or lightheaded, falls  fever, flu-like symptoms  high or low blood pressure  pain, tingling, numbness in the hands or feet  severe pain in the chest, back, flanks, or groin  swelling of the ankles, feet, hands  trouble passing urine or change in the amount of urine  unusually weak or tired Side effects that usually do not require medical attention (report to your doctor or health care professional if they continue or are bothersome):  cramps  dark colored stools  diarrhea  headache  nausea, vomiting  stomach upset This list may not describe all possible side effects. Call your doctor for medical advice about side effects. You may report side effects to FDA at 1-800-FDA-1088. Where should I keep my medicine? This drug is given in a hospital or clinic and will not be stored at home. NOTE: This sheet is a summary. It may not cover all   possible information. If you have questions about this medicine, talk to your doctor, pharmacist, or health care provider.  2020 Elsevier/Gold Standard (2007-12-02 15:58:57)     Hypokalemia Hypokalemia means that the amount of potassium in the blood is lower than normal. Potassium is a chemical (electrolyte) that  helps regulate the amount of fluid in the body. It also stimulates muscle tightening (contraction) and helps nerves work properly. Normally, most of the body's potassium is inside cells, and only a very small amount is in the blood. Because the amount in the blood is so small, minor changes to potassium levels in the blood can be life-threatening. What are the causes? This condition may be caused by:  Antibiotic medicine.  Diarrhea or vomiting. Taking too much of a medicine that helps you have a bowel movement (laxative) can cause diarrhea and lead to hypokalemia.  Chronic kidney disease (CKD).  Medicines that help the body get rid of excess fluid (diuretics).  Eating disorders, such as bulimia.  Low magnesium levels in the body.  Sweating a lot. What are the signs or symptoms? Symptoms of this condition include:  Weakness.  Constipation.  Fatigue.  Muscle cramps.  Mental confusion.  Skipped heartbeats or irregular heartbeat (palpitations).  Tingling or numbness. How is this diagnosed? This condition is diagnosed with a blood test. How is this treated? This condition may be treated by:  Taking potassium supplements by mouth.  Adjusting the medicines that you take.  Eating more foods that contain a lot of potassium. If your potassium level is very low, you may need to get potassium through an IV and be monitored in the hospital. Follow these instructions at home:   Take over-the-counter and prescription medicines only as told by your health care provider. This includes vitamins and supplements.  Eat a healthy diet. A healthy diet includes fresh fruits and vegetables, whole grains, healthy fats, and lean proteins.  If instructed, eat more foods that contain a lot of potassium. This includes: ? Nuts, such as peanuts and pistachios. ? Seeds, such as sunflower seeds and pumpkin seeds. ? Peas, lentils, and lima beans. ? Whole grain and bran cereals and  breads. ? Fresh fruits and vegetables, such as apricots, avocado, bananas, cantaloupe, kiwi, oranges, tomatoes, asparagus, and potatoes. ? Orange juice. ? Tomato juice. ? Red meats. ? Yogurt.  Keep all follow-up visits as told by your health care provider. This is important. Contact a health care provider if you:  Have weakness that gets worse.  Feel your heart pounding or racing.  Vomit.  Have diarrhea.  Have diabetes (diabetes mellitus) and you have trouble keeping your blood sugar (glucose) in your target range. Get help right away if you:  Have chest pain.  Have shortness of breath.  Have vomiting or diarrhea that lasts for more than 2 days.  Faint. Summary  Hypokalemia means that the amount of potassium in the blood is lower than normal.  This condition is diagnosed with a blood test.  Hypokalemia may be treated by taking potassium supplements, adjusting the medicines that you take, or eating more foods that are high in potassium.  If your potassium level is very low, you may need to get potassium through an IV and be monitored in the hospital. This information is not intended to replace advice given to you by your health care provider. Make sure you discuss any questions you have with your health care provider. Document Revised: 11/12/2017 Document Reviewed: 11/12/2017 Elsevier Patient Education  2020 Elsevier   Inc.  

## 2020-01-28 DIAGNOSIS — M1990 Unspecified osteoarthritis, unspecified site: Secondary | ICD-10-CM | POA: Diagnosis not present

## 2020-01-29 ENCOUNTER — Other Ambulatory Visit: Payer: Self-pay | Admitting: Nurse Practitioner

## 2020-01-29 ENCOUNTER — Other Ambulatory Visit: Payer: Self-pay | Admitting: Hematology

## 2020-01-29 DIAGNOSIS — I10 Essential (primary) hypertension: Secondary | ICD-10-CM

## 2020-01-31 ENCOUNTER — Other Ambulatory Visit: Payer: Self-pay | Admitting: Hematology

## 2020-01-31 ENCOUNTER — Other Ambulatory Visit: Payer: Self-pay | Admitting: Internal Medicine

## 2020-01-31 DIAGNOSIS — F331 Major depressive disorder, recurrent, moderate: Secondary | ICD-10-CM

## 2020-01-31 DIAGNOSIS — M1990 Unspecified osteoarthritis, unspecified site: Secondary | ICD-10-CM | POA: Diagnosis not present

## 2020-01-31 MED ORDER — ALPRAZOLAM 0.5 MG PO TABS: 0.5000 mg | ORAL_TABLET | Freq: Every evening | ORAL | 0 refills | Status: DC | PRN

## 2020-02-01 DIAGNOSIS — M1990 Unspecified osteoarthritis, unspecified site: Secondary | ICD-10-CM | POA: Diagnosis not present

## 2020-02-02 DIAGNOSIS — M1990 Unspecified osteoarthritis, unspecified site: Secondary | ICD-10-CM | POA: Diagnosis not present

## 2020-02-03 ENCOUNTER — Inpatient Hospital Stay: Payer: Medicaid Other

## 2020-02-03 ENCOUNTER — Inpatient Hospital Stay: Payer: Medicaid Other | Admitting: Hematology

## 2020-02-03 DIAGNOSIS — M1990 Unspecified osteoarthritis, unspecified site: Secondary | ICD-10-CM | POA: Diagnosis not present

## 2020-02-04 DIAGNOSIS — M1990 Unspecified osteoarthritis, unspecified site: Secondary | ICD-10-CM | POA: Diagnosis not present

## 2020-02-07 DIAGNOSIS — M1990 Unspecified osteoarthritis, unspecified site: Secondary | ICD-10-CM | POA: Diagnosis not present

## 2020-02-07 NOTE — Progress Notes (Deleted)
[place]   [phone]) [phone] [phone]) [phone]   Clinic Follow up Note   Patient Care Team: [name], MD as PCP - General (Internal Medicine) 02/07/2020  CHIEF COMPLAINT: Follow-up HHT and anemia  PREVIOUS THERAPY:  -Multiple EGD, small bowel enteroscope, C-scope with APC -Oral and iv amicar were given during hospital stay, no effect -IR embolization ofof the left gastric and short gastric artery branch vessels without evidence of residual flow in the [place] lesionon 12/10/17 -Avastin on 8/2 and 8/30 at [facility]; starting 12/26/17 continue 5mg /kg q2 weeksuntil 02/20/18, restarted on 04/03/2018 -Tamoxifen started in 10/2017 for HHTstopped 12/10/18 due toDVT  CURRENT THERAPY: -Blood transfusion for severe anemia secondary to GI bleedingas needed -ivferric gluconate every1-2 weeks as neededwith ferritin goal 100-200. Increased to weekly on 07/29/19.Reduced to every 2 weeks on 7/30/21unless ferritin > 200 -RestartedAvastin q2weeks on 04/03/18. Reduced to every 4 weeks starting 09/25/19 -Amicar 1g bidstarting4/06/2018. Reduced to only as needed on 12/10/18 due toDVT  INTERVAL HISTORY: Peggy House returns for f/u and treatment as scheduled. She received another cycle of Zirabev on 9/23 and 250 mg x2 on 10/7 and 10/14.    REVIEW OF SYSTEMS:   Constitutional: Denies fevers, chills or abnormal weight loss Eyes: Denies blurriness of vision Ears, nose, mouth, throat, and face: Denies mucositis or sore throat Respiratory: Denies cough, dyspnea or wheezes Cardiovascular: Denies palpitation, chest discomfort or lower extremity swelling Gastrointestinal:  Denies nausea, heartburn or change in bowel habits Skin: Denies abnormal skin rashes Lymphatics: Denies new lymphadenopathy or easy bruising Neurological:Denies numbness, tingling or new weaknesses Behavioral/Psych: Mood is stable, no new changes  All other systems were reviewed with the  patient and are negative.  MEDICAL HISTORY:  Past Medical History:  Diagnosis Date  . Anxiety   . Arthritis    knnes,back  . GERD (gastroesophageal reflux disease)   . 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 History of swelling of feet   . Hyperlipidemia   . Hypertension   . Major depressive disorder, recurrent episode ([place]) 06/05/2015  . Obesity   . Snores   . 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 ABDOMINAL HYSTERECTOMY    . CARPAL TUNNEL RELEASE  05/13/2011   Procedure: CARPAL TUNNEL RELEASE;  Surgeon: Nita Sells, MD;  Location: [place];  Service: Orthopedics;  Laterality: Left;  . COLONOSCOPY WITH PROPOFOL N/A 04/28/2014   Procedure: COLONOSCOPY WITH PROPOFOL;  Surgeon: Cleotis Nipper, MD;  Location: [facility] ENDOSCOPY;  Service: Endoscopy;  Laterality: N/A;  . DG TOES*L*  2/10   rt  . DILATION AND CURETTAGE OF UTERUS    . ENTEROSCOPY N/A 10/17/2017   Procedure: ENTEROSCOPY;  Surgeon: Otis Brace, MD;  Location: [place] ENDOSCOPY;  Service: Gastroenterology;  Laterality: N/A;  . ESOPHAGOGASTRODUODENOSCOPY N/A 04/10/2014   Procedure: ESOPHAGOGASTRODUODENOSCOPY (EGD);  Surgeon: [name], MD;  Location: [facility] ENDOSCOPY;  Service: Endoscopy;  Laterality: N/A;  . ESOPHAGOGASTRODUODENOSCOPY N/A 05/10/2017   Procedure: ESOPHAGOGASTRODUODENOSCOPY (EGD);  Surgeon: Ronald Lobo, MD;  Location: [facility] ENDOSCOPY;  Service: Endoscopy;  Laterality: N/A;  . ESOPHAGOGASTRODUODENOSCOPY N/A 09/22/2017   Procedure: ESOPHAGOGASTRODUODENOSCOPY (EGD);  Surgeon: Clarene Essex, MD;  Location: [place];  Service: Endoscopy;  Laterality: N/A;  bedside  . ESOPHAGOGASTRODUODENOSCOPY (EGD) WITH PROPOFOL N/A 04/27/2014   Procedure: ESOPHAGOGASTRODUODENOSCOPY (EGD) WITH PROPOFOL;  Surgeon: Cleotis Nipper, MD;  Location: [place];  Service: Endoscopy;  Laterality:  N/A;  possible apc  . ESOPHAGOGASTRODUODENOSCOPY (EGD) WITH PROPOFOL N/A 09/30/2017   Procedure: ESOPHAGOGASTRODUODENOSCOPY (EGD) WITH PROPOFOL;  Surgeon: [name], MD;  Location: [place];  Service: Gastroenterology;  Laterality: N/A;  . ESOPHAGOGASTRODUODENOSCOPY (EGD) WITH PROPOFOL N/A 10/01/2017   Procedure: ESOPHAGOGASTRODUODENOSCOPY (EGD) WITH PROPOFOL;  Surgeon: [name], MD;  Location: [place];  Service: Gastroenterology;  Laterality: N/A;  . ESOPHAGOGASTRODUODENOSCOPY (EGD) WITH PROPOFOL N/A 10/08/2017   Procedure: ESOPHAGOGASTRODUODENOSCOPY (EGD) WITH PROPOFOL;  Surgeon: Otis Brace, MD;  Location: [facility] ENDOSCOPY;  Service: Gastroenterology;  Laterality: N/A;  . ESOPHAGOGASTRODUODENOSCOPY (EGD) WITH PROPOFOL N/A 10/17/2017   Procedure: ESOPHAGOGASTRODUODENOSCOPY (EGD) WITH PROPOFOL;  Surgeon: Otis Brace, MD;  Location: [facility] ENDOSCOPY;  Service: Gastroenterology;  Laterality: N/A;  . ESOPHAGOGASTRODUODENOSCOPY (EGD) WITH PROPOFOL N/A 10/19/2017   Procedure: ESOPHAGOGASTRODUODENOSCOPY (EGD) WITH PROPOFOL;  Surgeon: Otis Brace, MD;  Location: [facility] ENDOSCOPY;  Service: Gastroenterology;  Laterality: N/A;  . ESOPHAGOGASTRODUODENOSCOPY (EGD) WITH PROPOFOL N/A 12/04/2018   Procedure: ESOPHAGOGASTRODUODENOSCOPY (EGD) WITH PROPOFOL;  Surgeon: Wilford Corner, MD;  Location: [facility] ENDOSCOPY;  Service: Endoscopy;  Laterality: N/A;  . GIVENS CAPSULE STUDY N/A 10/02/2017   Procedure: GIVENS CAPSULE STUDY;  Surgeon: [name], MD;  Location: [place];  Service: Gastroenterology;  Laterality: N/A;  . GIVENS CAPSULE STUDY N/A 10/08/2017   Procedure: GIVENS CAPSULE STUDY;  Surgeon: Otis Brace, MD;  Location: [place];  Service: Gastroenterology;  Laterality: N/A;  endoscopic placement of capsule  . HEMOSTASIS CLIP PLACEMENT  12/04/2018   Procedure: HEMOSTASIS CLIP PLACEMENT;  Surgeon: Wilford Corner, MD;  Location: [facility] ENDOSCOPY;  Service: Endoscopy;;  . HOT HEMOSTASIS  N/A 04/27/2014   Procedure: HOT HEMOSTASIS (ARGON PLASMA COAGULATION/BICAP);  Surgeon: Cleotis Nipper, MD;  Location: [facility] ENDOSCOPY;  Service: Endoscopy;  Laterality: N/A;  . HOT HEMOSTASIS N/A 09/30/2017   Procedure: HOT HEMOSTASIS (ARGON PLASMA COAGULATION/BICAP);  Surgeon: [name], MD;  Location: [place];  Service: Gastroenterology;  Laterality: N/A;  . HOT HEMOSTASIS N/A 10/01/2017   Procedure: HOT HEMOSTASIS (ARGON PLASMA COAGULATION/BICAP);  Surgeon: [name], MD;  Location: [place];  Service: Gastroenterology;  Laterality: N/A;  . HOT HEMOSTASIS N/A 10/17/2017   Procedure: HOT HEMOSTASIS (ARGON PLASMA COAGULATION/BICAP);  Surgeon: Otis Brace, MD;  Location: [facility] ENDOSCOPY;  Service: Gastroenterology;  Laterality: N/A;  . HOT HEMOSTASIS N/A 10/19/2017   Procedure: HOT HEMOSTASIS (ARGON PLASMA COAGULATION/BICAP);  Surgeon: Otis Brace, MD;  Location: [name] ENDOSCOPY;  Service: Gastroenterology;  Laterality: N/A;  . IR IMAGING GUIDED PORT INSERTION  07/08/2018  . L shoulder Surgery  2011  . SUBMUCOSAL INJECTION  09/22/2017   Procedure: SUBMUCOSAL INJECTION;  Surgeon: Clarene Essex, MD;  Location: [facility] ENDOSCOPY;  Service: Endoscopy;;  . SUBMUCOSAL INJECTION  12/04/2018   Procedure: SUBMUCOSAL INJECTION;  Surgeon: Wilford Corner, MD;  Location: [facility] ENDOSCOPY;  Service: Endoscopy;;    I have reviewed the social history and family history with the patient and they are unchanged from previous note.  ALLERGIES:  is allergic to feraheme [ferumoxytol], nsaids, tomato, and wasp venom.  MEDICATIONS:  Current Outpatient Medications  Medication Sig Dispense Refill  . sertraline (ZOLOFT) 100 MG tablet TAKE 1 TABLET(100 MG) BY MOUTH DAILY 90 tablet 1  . Accu-Chek Softclix Lancets lancets Use to check blood sugar before breakfast and before dinner while on steroids 100 each 1  . acitretin (SORIATANE) 10 MG capsule Take 10 mg by mouth daily.    [name] albuterol (PROVENTIL HFA;VENTOLIN HFA)  108 (90 Base) MCG/ACT inhaler Inhale 1-2 puffs into the lungs every 6 (six)  hours as needed for wheezing or shortness of breath. 1 Inhaler 3  . ALPRAZolam (XANAX) 0.5 MG tablet Take 1 tablet (0.5 mg total) by mouth at bedtime as needed for anxiety. 30 tablet 0  . Aminocaproic Acid 1000 MG TABS TAKE 1 TABLET(1000 MG) BY MOUTH TWICE DAILY 60 tablet 1  . amLODipine (NORVASC) 10 MG tablet TAKE 1 TABLET(10 MG) BY MOUTH DAILY 90 tablet 0  . atorvastatin (LIPITOR) 80 MG tablet TAKE 1 TABLET(80 MG) BY MOUTH DAILY 30 tablet 3  . cetirizine (ZYRTEC) 10 MG tablet TAKE 1 TABLET(10 MG) BY MOUTH DAILY (Patient taking differently: Take 10 mg by mouth daily. ) 90 tablet 1  . cyclobenzaprine (FLEXERIL) 10 MG tablet Take 1 tablet (10 mg total) by mouth 2 (two) times daily as needed for up to 20 doses for muscle spasms. 20 tablet 0  . diphenhydramine-acetaminophen (TYLENOL PM) 25-500 MG TABS tablet Take 1 tablet by mouth at bedtime as needed (sleep).    . diphenoxylate-atropine (LOMOTIL) 2.5-0.025 MG tablet 1 to 2 PO QID prn diarrhea 30 tablet 1  . glucose blood (ACCU-CHEK GUIDE) test strip Check blood sugar 2 times per day while on steroids before breakfast and dinner 50 each 3  . hydrochlorothiazide (HYDRODIURIL) 12.5 MG tablet TAKE 1 TABLET(12.5 MG) BY MOUTH DAILY 90 tablet 1  . lidocaine-prilocaine (EMLA) cream Apply 1 application topically as needed. 30 g 0  . meloxicam (MOBIC) 15 MG tablet Take 15 mg by mouth 3 (three) times daily.    . metFORMIN (GLUCOPHAGE) 500 MG tablet Take 1 tablet (500 mg total) by mouth 2 (two) times daily with a meal. 180 tablet 3  . pantoprazole (PROTONIX) 40 MG tablet Take 1 tablet (40 mg total) by mouth 2 (two) times daily. 60 tablet 5  . Podiatric Products (FLEXITOL HEEL BALM) OINT Apply topically.    . potassium chloride (KLOR-CON) 20 MEQ packet Take 20 mEq by mouth 2 (two) times daily. 30 packet 1  . prochlorperazine (COMPAZINE) 10 MG tablet Take 1 tablet (10 mg total) by mouth  every 6 (six) hours as needed for nausea or vomiting. 30 tablet 1  . traZODone (DESYREL) 50 MG tablet TAKE 1 TABLET(50 MG) BY MOUTH AT BEDTIME (Patient taking differently: Take 50 mg by mouth at bedtime. ) 30 tablet 2   No current facility-administered medications for this visit.    PHYSICAL EXAMINATION: ECOG PERFORMANCE STATUS: {CHL ONC ECOG [phone]}  There were no vitals filed for this visit. There were no vitals filed for this visit.  GENERAL:alert, no distress and comfortable SKIN: skin color, texture, turgor are normal, no rashes or significant lesions EYES: normal, Conjunctiva are pink and non-injected, sclera clear OROPHARYNX:no exudate, no erythema and lips, buccal mucosa, and tongue normal  NECK: supple, thyroid normal size, non-tender, without nodularity LYMPH:  no palpable lymphadenopathy in the cervical, axillary or inguinal LUNGS: clear to auscultation and percussion with normal breathing effort HEART: regular rate & rhythm and no murmurs and no lower extremity edema ABDOMEN:abdomen soft, non-tender and normal bowel sounds Musculoskeletal:no cyanosis of digits and no clubbing  NEURO: alert & oriented x 3 with fluent speech, no focal motor/sensory deficits  LABORATORY DATA:  I have reviewed the data as listed CBC Latest Ref Rng & Units 01/20/2020 01/06/2020 11/26/2019  WBC 4.0 - 10.5 K/uL 12.7(H) 13.5(H) 11.9(H)  Hemoglobin 12.0 - 15.0 g/dL 8.4(L) 7.2(L) 9.0(L)  Hematocrit 36 - 46 % 28.9(L) 24.4(L) 30.7(L)  Platelets 150 - 400 K/uL 309 301 284  CMP Latest Ref Rng & Units 01/20/2020 01/06/2020 11/26/2019  Glucose 70 - 99 mg/dL 118(H) 109(H) 119(H)  BUN 6 - 20 mg/dL 6 10 6   Creatinine 0.44 - 1.00 mg/dL 0.72 0.74 0.67  Sodium 135 - 145 mmol/L 142 141 143  Potassium 3.5 - 5.1 mmol/L 3.0(LL) 3.0(LL) 3.5  Chloride 98 - 111 mmol/L 105 104 108  CO2 22 - 32 mmol/L 30 28 23   Calcium 8.9 - 10.3 mg/dL 9.2 9.3 9.7  Total Protein 6.5 - 8.1 g/dL 7.2 7.1 7.5  Total  Bilirubin 0.3 - 1.2 mg/dL 0.3 0.2(L) 0.3  Alkaline Phos 38 - 126 U/L 91 83 83  AST 15 - 41 U/L 11(L) 10(L) 18  ALT 0 - 44 U/L 7 7 10       RADIOGRAPHIC STUDIES: I have personally reviewed the radiological images as listed and agreed with the findings in the report. No results found.   ASSESSMENT & PLAN:  No problem-specific Assessment & Plan notes found for this encounter.   No orders of the defined types were placed in this encounter.  All questions were answered. The patient knows to call the clinic with any problems, questions or concerns. No barriers to learning was detected. I spent {CHL ONC TIME VISIT - QIWLN:[id number]} counseling the patient face to face. The total time spent in the appointment was {CHL ONC TIME VISIT - EYCXK:[id number]} and more than 50% was on counseling and review of test results     Alla Feeling, NP 02/07/20

## 2020-02-08 ENCOUNTER — Inpatient Hospital Stay: Payer: Medicaid Other

## 2020-02-08 ENCOUNTER — Telehealth: Payer: Self-pay | Admitting: Nurse Practitioner

## 2020-02-08 ENCOUNTER — Inpatient Hospital Stay: Payer: Medicaid Other | Admitting: Nurse Practitioner

## 2020-02-08 DIAGNOSIS — M1990 Unspecified osteoarthritis, unspecified site: Secondary | ICD-10-CM | POA: Diagnosis not present

## 2020-02-08 NOTE — Progress Notes (Deleted)
[place]   Telephone:([phone]) [phone] Fax:(336) [phone]   Clinic Follow up Note   Patient Care Team: [name], MD as PCP - General (Internal Medicine) 02/08/2020  CHIEF COMPLAINT: Follow-up HHT and anemia  PREVIOUS THERAPY:  -Multiple EGD, small bowel enteroscope, C-scope with APC -Oral and iv amicar were given during hospital stay, no effect -IR embolization ofof the left gastric and short gastric artery branch vessels without evidence of residual flow in the Dieul[place]oy lesionon 12/10/17 -Avastin on 8/2 and 8/30 at [facility]; starting 12/26/17 continue 5mg /kg q2 weeksuntil 02/20/18, restarted on 04/03/2018 -Tamoxifen started in 10/2017 for HHTstopped 12/10/18 due toDVT  CURRENT THERAPY: -Blood transfusion for severe anemia secondary to GI bleedingas needed -ivferric gluconate every1-2 weeks as neededwith ferritin goal 100-200. Increased to weekly on 07/29/19.Reduced to every 2 weeks on 7/30/21unless ferritin > 200 -RestartedAvastin q2weeks on 04/03/18. Reduced to every 4 weeks starting 09/25/19 -Amicar 1g bidstarting4/06/2018. Reduced to only as needed on 12/10/18 due toDVT  INTERVAL HISTORY: Peggy House returns for follow-up and treatment as scheduled.  She was last seen a month ago.  She received another cycle of Zirabev and Ferrlecit 250 mg x 2 in the interval.  REVIEW OF SYSTEMS:   Constitutional: Denies fevers, chills or abnormal weight loss Eyes: Denies blurriness of vision Ears, nose, mouth, throat, and face: Denies mucositis or sore throat Respiratory: Denies cough, dyspnea or wheezes Cardiovascular: Denies palpitation, chest discomfort or lower extremity swelling Gastrointestinal:  Denies nausea, heartburn or change in bowel habits Skin: Denies abnormal skin rashes Lymphatics: Denies new lymphadenopathy or easy bruising Neurological:Denies numbness, tingling or new weaknesses Behavioral/Psych: Mood is stable, no new changes  All  other systems were reviewed with the patient and are negative.  MEDICAL HISTORY:  Past Medical History:  Diagnosis Date  . Anxiety   . Arthritis    knnes,back  . GERD (gastroesophageal reflux disease)   . 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)   . History of swelling of feet   . Hyperlipidemia   . Hypertension   . Major depressive disorder, recurrent episode ([place]) 06/05/2015  . Obesity   . Snores   . 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 ABDOMINAL HYSTERECTOMY    . CARPAL TUNNEL RELEASE  05/13/2011   Procedure: CARPAL TUNNEL RELEASE;  Surgeon: Nita Sells, MD;  Location: [place];  Service: Orthopedics;  Laterality: Left;  . COLONOSCOPY WITH PROPOFOL N/A 04/28/2014   Procedure: COLONOSCOPY WITH PROPOFOL;  Surgeon: Cleotis Nipper, MD;  Location: [facility] ENDOSCOPY;  Service: Endoscopy;  Laterality: N/A;  . DG TOES*L*  2/10   rt  . DILATION AND CURETTAGE OF UTERUS    . ENTEROSCOPY N/A 10/17/2017   Procedure: ENTEROSCOPY;  Surgeon: Otis Brace, MD;  Location: [facility] ENDOSCOPY;  Service: Gastroenterology;  Laterality: N/A;  . ESOPHAGOGASTRODUODENOSCOPY N/A 04/10/2014   Procedure: ESOPHAGOGASTRODUODENOSCOPY (EGD);  Surgeon: [name], MD;  Location: [facility] ENDOSCOPY;  Service: Endoscopy;  Laterality: N/A;  . ESOPHAGOGASTRODUODENOSCOPY N/A 05/10/2017   Procedure: ESOPHAGOGASTRODUODENOSCOPY (EGD);  Surgeon: Ronald Lobo, MD;  Location: [facility] ENDOSCOPY;  Service: Endoscopy;  Laterality: N/A;  . ESOPHAGOGASTRODUODENOSCOPY N/A 09/22/2017   Procedure: ESOPHAGOGASTRODUODENOSCOPY (EGD);  Surgeon: Clarene Essex, MD;  Location: [place];  Service: Endoscopy;  Laterality: N/A;  bedside  . ESOPHAGOGASTRODUODENOSCOPY (EGD) WITH PROPOFOL N/A 04/27/2014   Procedure: ESOPHAGOGASTRODUODENOSCOPY (EGD) WITH PROPOFOL;  Surgeon: Cleotis Nipper, MD;  Location: [facility]  ENDOSCOPY;  Service: Endoscopy;  Laterality: N/A;  possible apc  . ESOPHAGOGASTRODUODENOSCOPY (EGD) WITH PROPOFOL N/A 09/30/2017   Procedure: ESOPHAGOGASTRODUODENOSCOPY (EGD) WITH PROPOFOL;  Surgeon: [name], MD;  Location: [place];  Service: Gastroenterology;  Laterality: N/A;  . ESOPHAGOGASTRODUODENOSCOPY (EGD) WITH PROPOFOL N/A 10/01/2017   Procedure: ESOPHAGOGASTRODUODENOSCOPY (EGD) WITH PROPOFOL;  Surgeon: [name], MD;  Location: [place];  Service: Gastroenterology;  Laterality: N/A;  . ESOPHAGOGASTRODUODENOSCOPY (EGD) WITH PROPOFOL N/A 10/08/2017   Procedure: ESOPHAGOGASTRODUODENOSCOPY (EGD) WITH PROPOFOL;  Surgeon: Otis Brace, MD;  Location: [facility] ENDOSCOPY;  Service: Gastroenterology;  Laterality: N/A;  . ESOPHAGOGASTRODUODENOSCOPY (EGD) WITH PROPOFOL N/A 10/17/2017   Procedure: ESOPHAGOGASTRODUODENOSCOPY (EGD) WITH PROPOFOL;  Surgeon: Otis Brace, MD;  Location: [facility] ENDOSCOPY;  Service: Gastroenterology;  Laterality: N/A;  . ESOPHAGOGASTRODUODENOSCOPY (EGD) WITH PROPOFOL N/A 10/19/2017   Procedure: ESOPHAGOGASTRODUODENOSCOPY (EGD) WITH PROPOFOL;  Surgeon: Otis Brace, MD;  Location: [facility] ENDOSCOPY;  Service: Gastroenterology;  Laterality: N/A;  . ESOPHAGOGASTRODUODENOSCOPY (EGD) WITH PROPOFOL N/A 12/04/2018   Procedure: ESOPHAGOGASTRODUODENOSCOPY (EGD) WITH PROPOFOL;  Surgeon: Wilford Corner, MD;  Location: [facility] ENDOSCOPY;  Service: Endoscopy;  Laterality: N/A;  . GIVENS CAPSULE STUDY N/A 10/02/2017   Procedure: GIVENS CAPSULE STUDY;  Surgeon: [name], MD;  Location: [place];  Service: Gastroenterology;  Laterality: N/A;  . GIVENS CAPSULE STUDY N/A 10/08/2017   Procedure: GIVENS CAPSULE STUDY;  Surgeon: Otis Brace, MD;  Location: [place];  Service: Gastroenterology;  Laterality: N/A;  endoscopic placement of capsule  . HEMOSTASIS CLIP PLACEMENT  12/04/2018   Procedure: HEMOSTASIS CLIP PLACEMENT;  Surgeon: Wilford Corner, MD;  Location: [facility]  ENDOSCOPY;  Service: Endoscopy;;  . HOT HEMOSTASIS N/A 04/27/2014   Procedure: HOT HEMOSTASIS (ARGON PLASMA COAGULATION/BICAP);  Surgeon: Cleotis Nipper, MD;  Location: [facility] ENDOSCOPY;  Service: Endoscopy;  Laterality: N/A;  . HOT HEMOSTASIS N/A 09/30/2017   Procedure: HOT HEMOSTASIS (ARGON PLASMA COAGULATION/BICAP);  Surgeon: [name], MD;  Location: [place];  Service: Gastroenterology;  Laterality: N/A;  . HOT HEMOSTASIS N/A 10/01/2017   Procedure: HOT HEMOSTASIS (ARGON PLASMA COAGULATION/BICAP);  Surgeon: [name], MD;  Location: [place];  Service: Gastroenterology;  Laterality: N/A;  . HOT HEMOSTASIS N/A 10/17/2017   Procedure: HOT HEMOSTASIS (ARGON PLASMA COAGULATION/BICAP);  Surgeon: Otis Brace, MD;  Location: [facility] ENDOSCOPY;  Service: Gastroenterology;  Laterality: N/A;  . HOT HEMOSTASIS N/A 10/19/2017   Procedure: HOT HEMOSTASIS (ARGON PLASMA COAGULATION/BICAP);  Surgeon: Otis Brace, MD;  Location: [facility] ENDOSCOPY;  Service: Gastroenterology;  Laterality: N/A;  . IR IMAGING GUIDED PORT INSERTION  07/08/2018  . L shoulder Surgery  2011  . SUBMUCOSAL INJECTION  09/22/2017   Procedure: SUBMUCOSAL INJECTION;  Surgeon: Clarene Essex, MD;  Location: [facility] ENDOSCOPY;  Service: Endoscopy;;  . SUBMUCOSAL INJECTION  12/04/2018   Procedure: SUBMUCOSAL INJECTION;  Surgeon: Wilford Corner, MD;  Location: [facility] ENDOSCOPY;  Service: Endoscopy;;    I have reviewed the social history and family history with the patient and they are unchanged from previous note.  ALLERGIES:  is allergic to feraheme [ferumoxytol], nsaids, tomato, and wasp venom.  MEDICATIONS:  Current Outpatient Medications  Medication Sig Dispense Refill  . sertraline (ZOLOFT) 100 MG tablet TAKE 1 TABLET(100 MG) BY MOUTH DAILY 90 tablet 1  . Accu-Chek Softclix Lancets lancets Use to check blood sugar before breakfast and before dinner while on steroids 100 each 1  . acitretin (SORIATANE) 10 MG capsule Take 10 mg by mouth  daily.    [name] albuterol (PROVENTIL HFA;VENTOLIN HFA) 108 (90 Base) MCG/ACT inhaler Inhale 1-2 puffs  into the lungs every 6 (six) hours as needed for wheezing or shortness of breath. 1 Inhaler 3  . ALPRAZolam (XANAX) 0.5 MG tablet Take 1 tablet (0.5 mg total) by mouth at bedtime as needed for anxiety. 30 tablet 0  . Aminocaproic Acid 1000 MG TABS TAKE 1 TABLET(1000 MG) BY MOUTH TWICE DAILY 60 tablet 1  . amLODipine (NORVASC) 10 MG tablet TAKE 1 TABLET(10 MG) BY MOUTH DAILY 90 tablet 0  . atorvastatin (LIPITOR) 80 MG tablet TAKE 1 TABLET(80 MG) BY MOUTH DAILY 30 tablet 3  . cetirizine (ZYRTEC) 10 MG tablet TAKE 1 TABLET(10 MG) BY MOUTH DAILY (Patient taking differently: Take 10 mg by mouth daily. ) 90 tablet 1  . cyclobenzaprine (FLEXERIL) 10 MG tablet Take 1 tablet (10 mg total) by mouth 2 (two) times daily as needed for up to 20 doses for muscle spasms. 20 tablet 0  . diphenhydramine-acetaminophen (TYLENOL PM) 25-500 MG TABS tablet Take 1 tablet by mouth at bedtime as needed (sleep).    . diphenoxylate-atropine (LOMOTIL) 2.5-0.025 MG tablet 1 to 2 PO QID prn diarrhea 30 tablet 1  . glucose blood (ACCU-CHEK GUIDE) test strip Check blood sugar 2 times per day while on steroids before breakfast and dinner 50 each 3  . hydrochlorothiazide (HYDRODIURIL) 12.5 MG tablet TAKE 1 TABLET(12.5 MG) BY MOUTH DAILY 90 tablet 1  . lidocaine-prilocaine (EMLA) cream Apply 1 application topically as needed. 30 g 0  . meloxicam (MOBIC) 15 MG tablet Take 15 mg by mouth 3 (three) times daily.    . metFORMIN (GLUCOPHAGE) 500 MG tablet Take 1 tablet (500 mg total) by mouth 2 (two) times daily with a meal. 180 tablet 3  . pantoprazole (PROTONIX) 40 MG tablet Take 1 tablet (40 mg total) by mouth 2 (two) times daily. 60 tablet 5  . Podiatric Products (FLEXITOL HEEL BALM) OINT Apply topically.    . potassium chloride (KLOR-CON) 20 MEQ packet Take 20 mEq by mouth 2 (two) times daily. 30 packet 1  . prochlorperazine (COMPAZINE)  10 MG tablet Take 1 tablet (10 mg total) by mouth every 6 (six) hours as needed for nausea or vomiting. 30 tablet 1  . traZODone (DESYREL) 50 MG tablet TAKE 1 TABLET(50 MG) BY MOUTH AT BEDTIME (Patient taking differently: Take 50 mg by mouth at bedtime. ) 30 tablet 2   No current facility-administered medications for this visit.    PHYSICAL EXAMINATION: ECOG PERFORMANCE STATUS: {CHL ONC ECOG [phone]}  There were no vitals filed for this visit. There were no vitals filed for this visit.  GENERAL:alert, no distress and comfortable SKIN: skin color, texture, turgor are normal, no rashes or significant lesions EYES: normal, Conjunctiva are pink and non-injected, sclera clear OROPHARYNX:no exudate, no erythema and lips, buccal mucosa, and tongue normal  NECK: supple, thyroid normal size, non-tender, without nodularity LYMPH:  no palpable lymphadenopathy in the cervical, axillary or inguinal LUNGS: clear to auscultation and percussion with normal breathing effort HEART: regular rate & rhythm and no murmurs and no lower extremity edema ABDOMEN:abdomen soft, non-tender and normal bowel sounds Musculoskeletal:no cyanosis of digits and no clubbing  NEURO: alert & oriented x 3 with fluent speech, no focal motor/sensory deficits  LABORATORY DATA:  I have reviewed the data as listed CBC Latest Ref Rng & Units 01/20/2020 01/06/2020 11/26/2019  WBC 4.0 - 10.5 K/uL 12.7(H) 13.5(H) 11.9(H)  Hemoglobin 12.0 - 15.0 g/dL 8.4(L) 7.2(L) 9.0(L)  Hematocrit 36 - 46 % 28.9(L) 24.4(L) 30.7(L)  Platelets 150 -  400 K/uL 309 301 284     CMP Latest Ref Rng & Units 01/20/2020 01/06/2020 11/26/2019  Glucose 70 - 99 mg/dL 118(H) 109(H) 119(H)  BUN 6 - 20 mg/dL 6 10 6   Creatinine 0.44 - 1.00 mg/dL 0.72 0.74 0.67  Sodium 135 - 145 mmol/L 142 141 143  Potassium 3.5 - 5.1 mmol/L 3.0(LL) 3.0(LL) 3.5  Chloride 98 - 111 mmol/L 105 104 108  CO2 22 - 32 mmol/L 30 28 23   Calcium 8.9 - 10.3 mg/dL 9.2 9.3 9.7  Total  Protein 6.5 - 8.1 g/dL 7.2 7.1 7.5  Total Bilirubin 0.3 - 1.2 mg/dL 0.3 0.2(L) 0.3  Alkaline Phos 38 - 126 U/L 91 83 83  AST 15 - 41 U/L 11(L) 10(L) 18  ALT 0 - 44 U/L 7 7 10       RADIOGRAPHIC STUDIES: I have personally reviewed the radiological images as listed and agreed with the findings in the report. No results found.   ASSESSMENT & PLAN:  No problem-specific Assessment & Plan notes found for this encounter.   No orders of the defined types were placed in this encounter.  All questions were answered. The patient knows to call the clinic with any problems, questions or concerns. No barriers to learning was detected. I spent {CHL ONC TIME VISIT - YFRTM:[id number]} counseling the patient face to face. The total time spent in the appointment was {CHL ONC TIME VISIT - LIDCV:[id number]} and more than 50% was on counseling and review of test results     Alla Feeling, NP 02/08/20

## 2020-02-08 NOTE — Telephone Encounter (Signed)
R/s todays appt, patient couldn't make it. Called and left msg about new date and time for 10/27

## 2020-02-09 ENCOUNTER — Inpatient Hospital Stay: Payer: Medicaid Other

## 2020-02-09 ENCOUNTER — Inpatient Hospital Stay: Payer: Medicaid Other | Admitting: Nurse Practitioner

## 2020-02-09 DIAGNOSIS — M1990 Unspecified osteoarthritis, unspecified site: Secondary | ICD-10-CM | POA: Diagnosis not present

## 2020-02-10 ENCOUNTER — Telehealth: Payer: Self-pay | Admitting: Hematology

## 2020-02-10 DIAGNOSIS — M1990 Unspecified osteoarthritis, unspecified site: Secondary | ICD-10-CM | POA: Diagnosis not present

## 2020-02-10 NOTE — Telephone Encounter (Signed)
Scheduled per 10/28 staff message. Pt is aware of appts added on 10/29.

## 2020-02-11 ENCOUNTER — Telehealth: Payer: Self-pay | Admitting: Hematology

## 2020-02-11 ENCOUNTER — Encounter: Payer: Self-pay | Admitting: Hematology

## 2020-02-11 ENCOUNTER — Other Ambulatory Visit: Payer: Self-pay

## 2020-02-11 ENCOUNTER — Inpatient Hospital Stay: Payer: Medicaid Other

## 2020-02-11 ENCOUNTER — Inpatient Hospital Stay (HOSPITAL_BASED_OUTPATIENT_CLINIC_OR_DEPARTMENT_OTHER): Payer: Medicaid Other | Admitting: Hematology

## 2020-02-11 VITALS — BP 147/80 | HR 81 | Temp 97.6°F | Resp 18 | Ht 65.0 in | Wt 265.0 lb

## 2020-02-11 DIAGNOSIS — I780 Hereditary hemorrhagic telangiectasia: Secondary | ICD-10-CM

## 2020-02-11 DIAGNOSIS — R040 Epistaxis: Secondary | ICD-10-CM

## 2020-02-11 DIAGNOSIS — R808 Other proteinuria: Secondary | ICD-10-CM

## 2020-02-11 DIAGNOSIS — D500 Iron deficiency anemia secondary to blood loss (chronic): Secondary | ICD-10-CM

## 2020-02-11 DIAGNOSIS — M1990 Unspecified osteoarthritis, unspecified site: Secondary | ICD-10-CM | POA: Diagnosis not present

## 2020-02-11 DIAGNOSIS — Z95828 Presence of other vascular implants and grafts: Secondary | ICD-10-CM

## 2020-02-11 LAB — FERRITIN: Ferritin: 61 ng/mL (ref 11–307)

## 2020-02-11 LAB — CMP (CANCER CENTER ONLY)
ALT: 7 U/L (ref 0–44)
AST: 13 U/L — ABNORMAL LOW (ref 15–41)
Albumin: 3.4 g/dL — ABNORMAL LOW (ref 3.5–5.0)
Alkaline Phosphatase: 87 U/L (ref 38–126)
Anion gap: 8 (ref 5–15)
BUN: 12 mg/dL (ref 6–20)
CO2: 26 mmol/L (ref 22–32)
Calcium: 9.4 mg/dL (ref 8.9–10.3)
Chloride: 109 mmol/L (ref 98–111)
Creatinine: 0.8 mg/dL (ref 0.44–1.00)
GFR, Estimated: >60 mL/min (ref >60)
Glucose, Bld: 120 mg/dL — ABNORMAL HIGH (ref 70–99)
Potassium: 4 mmol/L (ref 3.5–5.1)
Sodium: 143 mmol/L (ref 135–145)
Total Bilirubin: 0.2 mg/dL — ABNORMAL LOW (ref 0.3–1.2)
Total Protein: 7.1 g/dL (ref 6.5–8.1)

## 2020-02-11 LAB — CBC WITH DIFFERENTIAL (CANCER CENTER ONLY)
Abs Immature Granulocytes: 0.04 10*3/uL (ref 0.00–0.07)
Basophils Absolute: 0 10*3/uL (ref 0.0–0.1)
Basophils Relative: 0 %
Eosinophils Absolute: 0.1 10*3/uL (ref 0.0–0.5)
Eosinophils Relative: 1 %
HCT: 33.2 % — ABNORMAL LOW (ref 36.0–46.0)
Hemoglobin: 9.6 g/dL — ABNORMAL LOW (ref 12.0–15.0)
Immature Granulocytes: 0 %
Lymphocytes Relative: 15 %
Lymphs Abs: 1.7 10*3/uL (ref 0.7–4.0)
MCH: 23.5 pg — ABNORMAL LOW (ref 26.0–34.0)
MCHC: 28.9 g/dL — ABNORMAL LOW (ref 30.0–36.0)
MCV: 81.4 fL (ref 80.0–100.0)
Monocytes Absolute: 0.7 10*3/uL (ref 0.1–1.0)
Monocytes Relative: 6 %
Neutro Abs: 8.7 10*3/uL — ABNORMAL HIGH (ref 1.7–7.7)
Neutrophils Relative %: 78 %
Platelet Count: 301 10*3/uL (ref 150–400)
RBC: 4.08 MIL/uL (ref 3.87–5.11)
RDW: 23.7 % — ABNORMAL HIGH (ref 11.5–15.5)
WBC Count: 11.2 10*3/uL — ABNORMAL HIGH (ref 4.0–10.5)
nRBC: 0 % (ref 0.0–0.2)

## 2020-02-11 LAB — IRON AND TIBC
Iron: 35 ug/dL — ABNORMAL LOW (ref 41–142)
Saturation Ratios: 12 % — ABNORMAL LOW (ref 21–57)
TIBC: 284 ug/dL (ref 236–444)
UIBC: 249 ug/dL (ref 120–384)

## 2020-02-11 LAB — TOTAL PROTEIN, URINE DIPSTICK: Protein, ur: 100 mg/dL — AB

## 2020-02-11 MED ORDER — SODIUM CHLORIDE 0.9 % IV SOLN
INTRAVENOUS | Status: DC
  Filled 2020-02-11: qty 250

## 2020-02-11 MED ORDER — AMINOCAPROIC ACID 1000 MG PO TABS: ORAL_TABLET | ORAL | 2 refills | Status: DC

## 2020-02-11 MED ORDER — HEPARIN SOD (PORK) LOCK FLUSH 100 UNIT/ML IV SOLN
250.0000 [IU] | Freq: Once | INTRAVENOUS | Status: DC | PRN
  Filled 2020-02-11: qty 5

## 2020-02-11 MED ORDER — SODIUM CHLORIDE 0.9% FLUSH
3.0000 mL | Freq: Once | INTRAVENOUS | Status: DC | PRN
  Filled 2020-02-11: qty 10

## 2020-02-11 MED ORDER — SODIUM CHLORIDE 0.9 % IV SOLN
250.0000 mg | Freq: Once | INTRAVENOUS | Status: AC
  Administered 2020-02-11: 250 mg via INTRAVENOUS
  Filled 2020-02-11: qty 20

## 2020-02-11 MED ORDER — SODIUM CHLORIDE 0.9% FLUSH
10.0000 mL | Freq: Once | INTRAVENOUS | Status: AC
  Administered 2020-02-11: 10 mL
  Filled 2020-02-11: qty 10

## 2020-02-11 MED ORDER — HEPARIN SOD (PORK) LOCK FLUSH 100 UNIT/ML IV SOLN
500.0000 [IU] | Freq: Once | INTRAVENOUS | Status: AC | PRN
  Administered 2020-02-11: 500 [IU]
  Filled 2020-02-11: qty 5

## 2020-02-11 MED ORDER — SODIUM CHLORIDE 0.9% FLUSH
10.0000 mL | INTRAVENOUS | Status: DC | PRN
  Administered 2020-02-11: 10 mL
  Filled 2020-02-11: qty 10

## 2020-02-11 MED ORDER — SODIUM CHLORIDE 0.9 % IV SOLN
5.0000 mg/kg | Freq: Once | INTRAVENOUS | Status: AC
  Administered 2020-02-11: 600 mg via INTRAVENOUS
  Filled 2020-02-11: qty 16

## 2020-02-11 MED ORDER — ALTEPLASE 2 MG IJ SOLR
2.0000 mg | Freq: Once | INTRAMUSCULAR | Status: DC | PRN
  Filled 2020-02-11: qty 2

## 2020-02-11 NOTE — Patient Instructions (Signed)
[place] Discharge Instructions for Patients Receiving Chemotherapy  Today you received the following chemotherapy agents  bevacizumab  To help prevent nausea and vomiting after your treatment, we encourage you to take your nausea medication as directed by MD    If you develop nausea and vomiting that is not controlled by your nausea medication, call the clinic.   BELOW ARE SYMPTOMS THAT SHOULD BE REPORTED IMMEDIATELY:  *FEVER GREATER THAN 100.5 F  *CHILLS WITH OR WITHOUT FEVER  NAUSEA AND VOMITING THAT IS NOT CONTROLLED WITH YOUR NAUSEA MEDICATION  *UNUSUAL SHORTNESS OF BREATH  *UNUSUAL BRUISING OR BLEEDING  TENDERNESS IN MOUTH AND THROAT WITH OR WITHOUT PRESENCE OF ULCERS  *URINARY PROBLEMS  *BOWEL PROBLEMS  UNUSUAL RASH Items with * indicate a potential emergency and should be followed up as soon as possible.  Feel free to call the clinic should you have any questions or concerns. The clinic phone number is [phone].  Please show the [place] at check-in to the Emergency Department and triage nurse. Sodium Ferric Gluconate Complex injection What is this medicine? SODIUM FERRIC GLUCONATE COMPLEX (SOE dee um FER ik GLOO koe nate KOM pleks) is an iron replacement. It is used with epoetin therapy to treat low iron levels in patients who are receiving hemodialysis. This medicine may be used for other purposes; ask your health care provider or pharmacist if you have questions. COMMON BRAND NAME(S): Ferrlecit, Nulecit What should I tell my health care provider before I take this medicine? They need to know if you have any of the following conditions:  anemia that is not from iron deficiency  high levels of iron in the body  an unusual or allergic reaction to iron, benzyl alcohol, other medicines, foods, dyes, or preservatives  pregnant or are trying to become pregnant  breast-feeding How should I use this medicine? This medicine is for  infusion into a vein. It is given by a health care professional in a hospital or clinic setting. Talk to your pediatrician regarding the use of this medicine in children. While this drug may be prescribed for children as young as 71 years old for selected conditions, precautions do apply. Overdosage: If you think you have taken too much of this medicine contact a poison control center or emergency room at once. NOTE: This medicine is only for you. Do not share this medicine with others. What if I miss a dose? It is important not to miss your dose. Call your doctor or health care professional if you are unable to keep an appointment. What may interact with this medicine? Do not take this medicine with any of the following medications:  deferoxamine  dimercaprol  other iron products This medicine may also interact with the following medications:  chloramphenicol  deferasirox  medicine for blood pressure like enalapril This list may not describe all possible interactions. Give your health care provider a list of all the medicines, herbs, non-prescription drugs, or dietary supplements you use. Also tell them if you smoke, drink alcohol, or use illegal drugs. Some items may interact with your medicine. What should I watch for while using this medicine? Your condition will be monitored carefully while you are receiving this medicine. Visit your doctor for check-ups as directed. What side effects may I notice from receiving this medicine? Side effects that you should report to your doctor or health care professional as soon as possible:  allergic reactions like skin rash, itching or hives, swelling of the face,  lips, or tongue  breathing problems  changes in hearing  changes in vision  chills, flushing, or sweating  fast, irregular heartbeat  feeling faint or lightheaded, falls  fever, flu-like symptoms  high or low blood pressure  pain, tingling, numbness in the hands or  feet  severe pain in the chest, back, flanks, or groin  swelling of the ankles, feet, hands  trouble passing urine or change in the amount of urine  unusually weak or tired Side effects that usually do not require medical attention (report to your doctor or health care professional if they continue or are bothersome):  cramps  dark colored stools  diarrhea  headache  nausea, vomiting  stomach upset This list may not describe all possible side effects. Call your doctor for medical advice about side effects. You may report side effects to FDA at 1-800-FDA-1088. Where should I keep my medicine? This drug is given in a hospital or clinic and will not be stored at home. NOTE: This sheet is a summary. It may not cover all possible information. If you have questions about this medicine, talk to your doctor, pharmacist, or health care provider.  2020 Elsevier/Gold Standard (2007-12-02 15:58:57)

## 2020-02-11 NOTE — Progress Notes (Signed)
OK to treat today with urine protien of 100 per Dr. Burr Medico

## 2020-02-11 NOTE — Telephone Encounter (Signed)
Scheduled per 10/29 los. Waiting for add on 11/11. Pt requested next appt the day after thanksgiving. Printed appt calendar for pt and will give a pt a call about appts on 11/11.

## 2020-02-11 NOTE — Progress Notes (Signed)
[place]   [phone] [phone]   Clinic Follow up Note   Patient Care Team: [name], MD as PCP - General (Internal Medicine)  Date of Service:  02/11/2020  CHIEF COMPLAINT: F/uof HHT andAnemia  PREVIOUS THERAPY:  -Multiple EGD, small bowel enteroscope, C-scope with APC -Oral and iv amicar were given during hospital stay, no effect -IR embolization ofof the left gastric and short gastric artery branch vessels without evidence of residual flow in the Dieulafoy lesionon 12/10/17 -Avastin on 8/2 and 8/30 at [facility]; starting 12/26/17 continue 5mg /kg q2 weeksuntil 02/20/18, restarted on 04/03/2018 -Tamoxifen started in 10/2017 for HHTstopped 12/10/18 due toDVT  CURRENT THERAPY: -Blood transfusion for severe anemia secondary to GI bleedingas needed -ivferric gluconate every1-2 weeks as neededwith ferritin goal 100-200. Increased to weekly on 07/29/19.Reduced to every 2 weeks on 11/12/19 -RestartedAvastin/Zirabev q2weeks on 04/03/18. Reduced to every 4 weeks starting 09/25/19. Increased to every 2 weeks on 01/06/20. Increased dose to 250mg  on 01/20/20 -Amicar 1g bidstarting4/06/2018. Reduced to only as needed on 12/10/18 due toDVT  INTERVAL HISTORY:  Peggy House is here for a follow up. She presents to the clinic alone. She note her husband was tragically passed after being hit by a car in August. She has had to reschedule her appointments. She is getting support from her son. She is on Meloxicam for her knee pain and on Amicar BID for her nosebleeds. She notes she is not sure if she wants another flu shot. She will think about it   REVIEW OF SYSTEMS:   Constitutional: Denies fevers, chills or abnormal weight loss Eyes: Denies blurriness of vision Ears, nose, mouth, throat, and face: Denies mucositis or sore throat Respiratory: Denies cough, dyspnea or wheezes Cardiovascular: Denies palpitation, chest discomfort or  lower extremity swelling Gastrointestinal:  Denies nausea, heartburn or change in bowel habits Skin: Denies abnormal skin rashes Lymphatics: Denies new lymphadenopathy or easy bruising Neurological:Denies numbness, tingling or new weaknesses Behavioral/Psych: Mood is stable, no new changes  All other systems were reviewed with the patient and are negative.  MEDICAL HISTORY:  Past Medical History:  Diagnosis Date  . Anxiety   . Arthritis    knnes,back  . GERD (gastroesophageal reflux disease)   . 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 History of swelling of feet   . Hyperlipidemia   . Hypertension   . Major depressive disorder, recurrent episode ([place]) 06/05/2015  . Obesity   . Snores   . 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 ABDOMINAL HYSTERECTOMY    . CARPAL TUNNEL RELEASE  05/13/2011   Procedure: CARPAL TUNNEL RELEASE;  Surgeon: Nita Sells, MD;  Location: [place];  Service: Orthopedics;  Laterality: Left;  . COLONOSCOPY WITH PROPOFOL N/A 04/28/2014   Procedure: COLONOSCOPY WITH PROPOFOL;  Surgeon: Cleotis Nipper, MD;  Location: [facility] ENDOSCOPY;  Service: Endoscopy;  Laterality: N/A;  . DG TOES*L*  2/10   rt  . DILATION AND CURETTAGE OF UTERUS    . ENTEROSCOPY N/A 10/17/2017   Procedure: ENTEROSCOPY;  Surgeon: Otis Brace, MD;  Location: [place] ENDOSCOPY;  Service: Gastroenterology;  Laterality: N/A;  . ESOPHAGOGASTRODUODENOSCOPY N/A 04/10/2014   Procedure: ESOPHAGOGASTRODUODENOSCOPY (EGD);  Surgeon: [name], MD;  Location: [facility] ENDOSCOPY;  Service: Endoscopy;  Laterality: N/A;  . ESOPHAGOGASTRODUODENOSCOPY N/A 05/10/2017   Procedure: ESOPHAGOGASTRODUODENOSCOPY (EGD);  Surgeon: Ronald Lobo, MD;  Location: [facility]  ENDOSCOPY;  Service: Endoscopy;  Laterality: N/A;  . ESOPHAGOGASTRODUODENOSCOPY N/A 09/22/2017   Procedure:  ESOPHAGOGASTRODUODENOSCOPY (EGD);  Surgeon: Clarene Essex, MD;  Location: [place];  Service: Endoscopy;  Laterality: N/A;  bedside  . ESOPHAGOGASTRODUODENOSCOPY (EGD) WITH PROPOFOL N/A 04/27/2014   Procedure: ESOPHAGOGASTRODUODENOSCOPY (EGD) WITH PROPOFOL;  Surgeon: Cleotis Nipper, MD;  Location: [place];  Service: Endoscopy;  Laterality: N/A;  possible apc  . ESOPHAGOGASTRODUODENOSCOPY (EGD) WITH PROPOFOL N/A 09/30/2017   Procedure: ESOPHAGOGASTRODUODENOSCOPY (EGD) WITH PROPOFOL;  Surgeon: [name], MD;  Location: [place];  Service: Gastroenterology;  Laterality: N/A;  . ESOPHAGOGASTRODUODENOSCOPY (EGD) WITH PROPOFOL N/A 10/01/2017   Procedure: ESOPHAGOGASTRODUODENOSCOPY (EGD) WITH PROPOFOL;  Surgeon: [name], MD;  Location: [place];  Service: Gastroenterology;  Laterality: N/A;  . ESOPHAGOGASTRODUODENOSCOPY (EGD) WITH PROPOFOL N/A 10/08/2017   Procedure: ESOPHAGOGASTRODUODENOSCOPY (EGD) WITH PROPOFOL;  Surgeon: Otis Brace, MD;  Location: [facility] ENDOSCOPY;  Service: Gastroenterology;  Laterality: N/A;  . ESOPHAGOGASTRODUODENOSCOPY (EGD) WITH PROPOFOL N/A 10/17/2017   Procedure: ESOPHAGOGASTRODUODENOSCOPY (EGD) WITH PROPOFOL;  Surgeon: Otis Brace, MD;  Location: [facility] ENDOSCOPY;  Service: Gastroenterology;  Laterality: N/A;  . ESOPHAGOGASTRODUODENOSCOPY (EGD) WITH PROPOFOL N/A 10/19/2017   Procedure: ESOPHAGOGASTRODUODENOSCOPY (EGD) WITH PROPOFOL;  Surgeon: Otis Brace, MD;  Location: [facility] ENDOSCOPY;  Service: Gastroenterology;  Laterality: N/A;  . ESOPHAGOGASTRODUODENOSCOPY (EGD) WITH PROPOFOL N/A 12/04/2018   Procedure: ESOPHAGOGASTRODUODENOSCOPY (EGD) WITH PROPOFOL;  Surgeon: Wilford Corner, MD;  Location: [facility] ENDOSCOPY;  Service: Endoscopy;  Laterality: N/A;  . GIVENS CAPSULE STUDY N/A 10/02/2017   Procedure: GIVENS CAPSULE STUDY;  Surgeon: [name], MD;  Location: [place];  Service: Gastroenterology;  Laterality: N/A;  . GIVENS CAPSULE STUDY N/A 10/08/2017     Procedure: GIVENS CAPSULE STUDY;  Surgeon: Otis Brace, MD;  Location: [place];  Service: Gastroenterology;  Laterality: N/A;  endoscopic placement of capsule  . HEMOSTASIS CLIP PLACEMENT  12/04/2018   Procedure: HEMOSTASIS CLIP PLACEMENT;  Surgeon: Wilford Corner, MD;  Location: [facility] ENDOSCOPY;  Service: Endoscopy;;  . HOT HEMOSTASIS N/A 04/27/2014   Procedure: HOT HEMOSTASIS (ARGON PLASMA COAGULATION/BICAP);  Surgeon: Cleotis Nipper, MD;  Location: [facility] ENDOSCOPY;  Service: Endoscopy;  Laterality: N/A;  . HOT HEMOSTASIS N/A 09/30/2017   Procedure: HOT HEMOSTASIS (ARGON PLASMA COAGULATION/BICAP);  Surgeon: [name], MD;  Location: [place];  Service: Gastroenterology;  Laterality: N/A;  . HOT HEMOSTASIS N/A 10/01/2017   Procedure: HOT HEMOSTASIS (ARGON PLASMA COAGULATION/BICAP);  Surgeon: [name], MD;  Location: [place];  Service: Gastroenterology;  Laterality: N/A;  . HOT HEMOSTASIS N/A 10/17/2017   Procedure: HOT HEMOSTASIS (ARGON PLASMA COAGULATION/BICAP);  Surgeon: Otis Brace, MD;  Location: [facility] ENDOSCOPY;  Service: Gastroenterology;  Laterality: N/A;  . HOT HEMOSTASIS N/A 10/19/2017   Procedure: HOT HEMOSTASIS (ARGON PLASMA COAGULATION/BICAP);  Surgeon: Otis Brace, MD;  Location: [facility] ENDOSCOPY;  Service: Gastroenterology;  Laterality: N/A;  . IR IMAGING GUIDED PORT INSERTION  07/08/2018  . L shoulder Surgery  2011  . SUBMUCOSAL INJECTION  09/22/2017   Procedure: SUBMUCOSAL INJECTION;  Surgeon: Clarene Essex, MD;  Location: [facility] ENDOSCOPY;  Service: Endoscopy;;  . SUBMUCOSAL INJECTION  12/04/2018   Procedure: SUBMUCOSAL INJECTION;  Surgeon: Wilford Corner, MD;  Location: [facility] ENDOSCOPY;  Service: Endoscopy;;    I have reviewed the social history and family history with the patient and they are unchanged from previous note.  ALLERGIES:  is allergic to feraheme [ferumoxytol], nsaids, tomato, and wasp venom.  MEDICATIONS:  Current Outpatient Medications   Medication Sig Dispense Refill  . sertraline (ZOLOFT) 100 MG tablet TAKE  1 TABLET(100 MG) BY MOUTH DAILY 90 tablet 1  . Accu-Chek Softclix Lancets lancets Use to check blood sugar before breakfast and before dinner while on steroids 100 each 1  . acitretin (SORIATANE) 10 MG capsule Take 10 mg by mouth daily.    [name] albuterol (PROVENTIL HFA;VENTOLIN HFA) 108 (90 Base) MCG/ACT inhaler Inhale 1-2 puffs into the lungs every 6 (six) hours as needed for wheezing or shortness of breath. 1 Inhaler 3  . ALPRAZolam (XANAX) 0.5 MG tablet Take 1 tablet (0.5 mg total) by mouth at bedtime as needed for anxiety. 30 tablet 0  . Aminocaproic Acid 1000 MG TABS TAKE 1 TABLET(1000 MG) BY MOUTH TWICE DAILY 60 tablet 2  . amLODipine (NORVASC) 10 MG tablet TAKE 1 TABLET(10 MG) BY MOUTH DAILY 90 tablet 0  . atorvastatin (LIPITOR) 80 MG tablet TAKE 1 TABLET(80 MG) BY MOUTH DAILY 30 tablet 3  . cetirizine (ZYRTEC) 10 MG tablet TAKE 1 TABLET(10 MG) BY MOUTH DAILY (Patient taking differently: Take 10 mg by mouth daily. ) 90 tablet 1  . cyclobenzaprine (FLEXERIL) 10 MG tablet Take 1 tablet (10 mg total) by mouth 2 (two) times daily as needed for up to 20 doses for muscle spasms. 20 tablet 0  . diphenhydramine-acetaminophen (TYLENOL PM) 25-500 MG TABS tablet Take 1 tablet by mouth at bedtime as needed (sleep).    . diphenoxylate-atropine (LOMOTIL) 2.5-0.025 MG tablet 1 to 2 PO QID prn diarrhea 30 tablet 1  . glucose blood (ACCU-CHEK GUIDE) test strip Check blood sugar 2 times per day while on steroids before breakfast and dinner 50 each 3  . hydrochlorothiazide (HYDRODIURIL) 12.5 MG tablet TAKE 1 TABLET(12.5 MG) BY MOUTH DAILY 90 tablet 1  . lidocaine-prilocaine (EMLA) cream Apply 1 application topically as needed. 30 g 0  . meloxicam (MOBIC) 15 MG tablet Take 15 mg by mouth 3 (three) times daily.    . metFORMIN (GLUCOPHAGE) 500 MG tablet Take 1 tablet (500 mg total) by mouth 2 (two) times daily with a meal. 180 tablet 3  .  pantoprazole (PROTONIX) 40 MG tablet Take 1 tablet (40 mg total) by mouth 2 (two) times daily. 60 tablet 5  . Podiatric Products (FLEXITOL HEEL BALM) OINT Apply topically.    . potassium chloride (KLOR-CON) 20 MEQ packet Take 20 mEq by mouth 2 (two) times daily. 30 packet 1  . prochlorperazine (COMPAZINE) 10 MG tablet Take 1 tablet (10 mg total) by mouth every 6 (six) hours as needed for nausea or vomiting. 30 tablet 1  . traZODone (DESYREL) 50 MG tablet TAKE 1 TABLET(50 MG) BY MOUTH AT BEDTIME (Patient taking differently: Take 50 mg by mouth at bedtime. ) 30 tablet 2   No current facility-administered medications for this visit.   Facility-Administered Medications Ordered in Other Visits  Medication Dose Route Frequency Provider Last Rate Last Admin  . 0.9 %  sodium chloride infusion   Intravenous Continuous Truitt Merle, MD 20 mL/hr at 02/11/20 1250 New Bag at 02/11/20 1250  . alteplase (CATHFLO ACTIVASE) injection 2 mg  2 mg Intracatheter Once PRN Truitt Merle, MD      . bevacizumab-bvzr (ZIRABEV) 600 mg in sodium chloride 0.9 % 100 mL chemo infusion  5 mg/kg (Treatment Plan Recorded) Intravenous Once Truitt Merle, MD      . ferric gluconate (NULECIT) 250 mg in sodium chloride 0.9 % 250 mL IVPB  250 mg Intravenous Once Truitt Merle, MD 90 mL/hr at 02/11/20 1308 250 mg at 02/11/20 1308  .  heparin lock flush 100 unit/mL  250 Units Intracatheter Once PRN Truitt Merle, MD      . heparin lock flush 100 unit/mL  500 Units Intracatheter Once PRN Truitt Merle, MD      . sodium chloride flush (NS) 0.9 % injection 10 mL  10 mL Intracatheter PRN Truitt Merle, MD      . sodium chloride flush (NS) 0.9 % injection 3 mL  3 mL Intracatheter Once PRN Truitt Merle, MD        PHYSICAL EXAMINATION: ECOG PERFORMANCE STATUS: 2 - Symptomatic, <50% confined to bed  Vitals:   02/11/20 1142 02/11/20 1146  BP: (!) 152/82 (!) 147/80  Pulse: 81   Resp: 18   Temp: 97.6 F (36.4 C)   SpO2: 98%    Filed Weights   02/11/20 1142    Weight: 265 lb (120.2 kg)    Due to COVID19 we will limit examination to appearance. Patient had no complaints.  GENERAL:alert, no distress and comfortable SKIN: skin color normal, no rashes or significant lesions EYES: normal, Conjunctiva are pink and non-injected, sclera clear  NEURO: alert & oriented x 3 with fluent speech   LABORATORY DATA:  I have reviewed the data as listed CBC Latest Ref Rng & Units 02/11/2020 01/20/2020 01/06/2020  WBC 4.0 - 10.5 K/uL 11.2(H) 12.7(H) 13.5(H)  Hemoglobin 12.0 - 15.0 g/dL 9.6(L) 8.4(L) 7.2(L)  Hematocrit 36 - 46 % 33.2(L) 28.9(L) 24.4(L)  Platelets 150 - 400 K/uL 301 309 301     CMP Latest Ref Rng & Units 02/11/2020 01/20/2020 01/06/2020  Glucose 70 - 99 mg/dL 120(H) 118(H) 109(H)  BUN 6 - 20 mg/dL 12 6 10   Creatinine 0.44 - 1.00 mg/dL 0.80 0.72 0.74  Sodium 135 - 145 mmol/L 143 142 141  Potassium 3.5 - 5.1 mmol/L 4.0 3.0(LL) 3.0(LL)  Chloride 98 - 111 mmol/L 109 105 104  CO2 22 - 32 mmol/L 26 30 28   Calcium 8.9 - 10.3 mg/dL 9.4 9.2 9.3  Total Protein 6.5 - 8.1 g/dL 7.1 7.2 7.1  Total Bilirubin 0.3 - 1.2 mg/dL 0.2(L) 0.3 0.2(L)  Alkaline Phos 38 - 126 U/L 87 91 83  AST 15 - 41 U/L 13(L) 11(L) 10(L)  ALT 0 - 44 U/L 7 7 7       RADIOGRAPHIC STUDIES: I have personally reviewed the radiological images as listed and agreed with the findings in the report. No results found.   ASSESSMENT & PLAN:  Peggy House is a 59 y.o. female with    1. 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 Extensive workup found the pt to have HHT based on her personal and familyhistoryand recurrentAVM GI bleedings. -Sheis s/pIR embolization of the left Gastric and short gastric artery branchvesselswithout evidence of residual flow in the Dieulafoy lesionon  12/10/2017. -She is currently being treated with Amicar with severe bleeding and Zirabev every 4 weeks (if proteinuria <300) and IV Ferric Gluconate every 1-2 weeks (unless Ferritin >200)and blood transfusions as needed.We repeatedly discussed consistency in treatments to manage her blood counts, she voices good understanding.  -She is clinically stable. Labs reviewed, WBC 11.2, Hg 9.6, ANC 8.7. Proceed with Ferric Gluconate and Zirabec today if urine protein <300. She has been tolerated 250mg  dose well.  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 She is also on blood transfusionsas needed.Last Blood transfusion was 01/18/20. Continue prenatal vitamin.  -Although she has not had recent GI bleeding she still has occasional moderate epistaxis.   3. Reactive Leukocytosis  -She has mild leukocytosis with dominant neutrophils, likely reactive. Her prior thrombocytosis resolved.  -Mostly stable.   4. 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 She required cauterization for stomach for severe Gi bleeding in 11/2018 -Continue to follow up with her GI, Dr. Cristina Gong -I started heron Amicar 07/17/18. Given DVT she will only take Amicar for severe epistaxis -Her GI bleeding has resolved currently.But has been havingoccasionalmildepistaxis.   5.History of right Arm DVT -She had right arm DVT that extended to her right clavicleon 12/07/2018.  -She is not a candidate for anticoagulation due to severe GI  bleeding. We stopped Tamoxifen, she I son low dose Amicar for epistaxis. Will maintain [place] flushes.  6. Smoking cessation -She was smoking 10 cigarettes a day on average before recent hospitalization -I strongly encouraged her to stop smoking completely, she will try.  7. Hypokalemia -Onpotassium chloride 20 mEq daily, continue. Willmonitor  9. HTN, uncontrolled, Recently diagnosed DM -Continue medications. Will monitor on Avastin.  -Continue to f/u with PCP   10. Anxiety  -She has been stressed with family health issues (her husband's passing summer 2021 and son's DM on transplant list). She has been more anxious and started having panic attacks -She is currently on Xanax at 0.5mg  dose to use only as needed. F/u with SW as needed for counseling.   11. B/l LE Edema  -Stable. Continue compression socks    PLAN: -Proceed with Ferric Gluconate 250mg  today and Zirabev if urine protein <300 -Lab, flush, Ferric Gluconate in 2, 4, 6 weeks (holdironif ferritin >200 on last lab)  -Continue Beva in 4 weeks if urine protein <300 -Continue prenatal Vitamin. -F/u in 6 weeks  -she will call us if she has overt GI bleeding    No problem-specific Assessment & Plan notes found for this encounter.   No orders of the defined types were placed in this encounter.  All questions were answered. The patient knows to call the clinic with any problems, questions or concerns. No barriers to learning was detected.      Truitt Merle, MD 02/11/2020   I, Joslyn Devon, am acting as scribe for Truitt Merle, MD.   I have reviewed the above documentation for accuracy and completeness, and I agree with the above.

## 2020-02-11 NOTE — Patient Instructions (Signed)

## 2020-02-14 ENCOUNTER — Telehealth: Payer: Self-pay | Admitting: Hematology

## 2020-02-14 DIAGNOSIS — M1990 Unspecified osteoarthritis, unspecified site: Secondary | ICD-10-CM | POA: Diagnosis not present

## 2020-02-14 NOTE — Telephone Encounter (Signed)
Scheduled per 10/29 los. Pt is aware of appt times and date.

## 2020-02-15 DIAGNOSIS — M1990 Unspecified osteoarthritis, unspecified site: Secondary | ICD-10-CM | POA: Diagnosis not present

## 2020-02-16 DIAGNOSIS — M1990 Unspecified osteoarthritis, unspecified site: Secondary | ICD-10-CM | POA: Diagnosis not present

## 2020-02-17 DIAGNOSIS — M1990 Unspecified osteoarthritis, unspecified site: Secondary | ICD-10-CM | POA: Diagnosis not present

## 2020-02-18 DIAGNOSIS — M1990 Unspecified osteoarthritis, unspecified site: Secondary | ICD-10-CM | POA: Diagnosis not present

## 2020-02-21 DIAGNOSIS — M1990 Unspecified osteoarthritis, unspecified site: Secondary | ICD-10-CM | POA: Diagnosis not present

## 2020-02-22 ENCOUNTER — Other Ambulatory Visit: Payer: Self-pay

## 2020-02-22 ENCOUNTER — Telehealth: Payer: Self-pay

## 2020-02-22 ENCOUNTER — Ambulatory Visit: Payer: Self-pay

## 2020-02-22 DIAGNOSIS — N3941 Urge incontinence: Secondary | ICD-10-CM | POA: Diagnosis not present

## 2020-02-22 DIAGNOSIS — N319 Neuromuscular dysfunction of bladder, unspecified: Secondary | ICD-10-CM | POA: Diagnosis not present

## 2020-02-22 DIAGNOSIS — M1990 Unspecified osteoarthritis, unspecified site: Secondary | ICD-10-CM | POA: Diagnosis not present

## 2020-02-22 NOTE — Patient Instructions (Signed)
Thank you for taking time to speak with me today about care coordination and care management services available to you at no cost as part of your Medicaid benefit. These services are voluntary. Our team is available to provide assistance regarding your health care needs at any time. Please do not hesitate to reach out to me if we can be of service to you at any time in the future.   Netta Neat, BSW, MSW, LCSW Social Work Case Freight forwarder - [place]  Direct [place]: [phone]

## 2020-02-22 NOTE — Patient Outreach (Signed)
Care Coordination  02/22/2020  Peggy House 1960/07/11 [id number]   The patient was referred to the case management team for assistance with high risk care management and care coordination. The patient declined services at this time. The care management team is pleased to engage with this patient at any time in the future should he/she be interested in assistance from the care management team.   Follow Up Plan: No further follow up required: patient declined services.  Netta Neat, BSW, MSW, LCSW Social Work Case Freight forwarder - [place]  Direct [place]: [phone]

## 2020-02-22 NOTE — Telephone Encounter (Signed)
[name] called stating she had a bloody nose last night that lasted for approximately 1 hour.  I instructed her to use saline nose spray every 2 hours as needed and to run a humidifier in her room at night.  She verbalized understanding.

## 2020-02-23 DIAGNOSIS — M1990 Unspecified osteoarthritis, unspecified site: Secondary | ICD-10-CM | POA: Diagnosis not present

## 2020-02-24 DIAGNOSIS — M1990 Unspecified osteoarthritis, unspecified site: Secondary | ICD-10-CM | POA: Diagnosis not present

## 2020-02-25 ENCOUNTER — Inpatient Hospital Stay: Payer: Medicaid Other

## 2020-02-25 ENCOUNTER — Other Ambulatory Visit: Payer: Self-pay

## 2020-02-25 ENCOUNTER — Inpatient Hospital Stay: Payer: Medicaid Other | Attending: Hematology

## 2020-02-25 VITALS — BP 160/90 | HR 76 | Temp 98.4°F | Resp 18

## 2020-02-25 DIAGNOSIS — R808 Other proteinuria: Secondary | ICD-10-CM

## 2020-02-25 DIAGNOSIS — D500 Iron deficiency anemia secondary to blood loss (chronic): Secondary | ICD-10-CM

## 2020-02-25 DIAGNOSIS — I780 Hereditary hemorrhagic telangiectasia: Secondary | ICD-10-CM | POA: Diagnosis not present

## 2020-02-25 DIAGNOSIS — M1990 Unspecified osteoarthritis, unspecified site: Secondary | ICD-10-CM | POA: Diagnosis not present

## 2020-02-25 DIAGNOSIS — Z95828 Presence of other vascular implants and grafts: Secondary | ICD-10-CM

## 2020-02-25 LAB — CMP (CANCER CENTER ONLY)
ALT: <6 U/L (ref 0–44)
AST: 13 U/L — ABNORMAL LOW (ref 15–41)
Albumin: 3.5 g/dL (ref 3.5–5.0)
Alkaline Phosphatase: 80 U/L (ref 38–126)
Anion gap: 9 (ref 5–15)
BUN: 11 mg/dL (ref 6–20)
CO2: 26 mmol/L (ref 22–32)
Calcium: 9.2 mg/dL (ref 8.9–10.3)
Chloride: 106 mmol/L (ref 98–111)
Creatinine: 0.82 mg/dL (ref 0.44–1.00)
GFR, Estimated: >60 mL/min (ref >60)
Glucose, Bld: 118 mg/dL — ABNORMAL HIGH (ref 70–99)
Potassium: 3.7 mmol/L (ref 3.5–5.1)
Sodium: 141 mmol/L (ref 135–145)
Total Bilirubin: 0.4 mg/dL (ref 0.3–1.2)
Total Protein: 7.5 g/dL (ref 6.5–8.1)

## 2020-02-25 LAB — CBC WITH DIFFERENTIAL (CANCER CENTER ONLY)
Abs Immature Granulocytes: 0.05 10*3/uL (ref 0.00–0.07)
Basophils Absolute: 0 10*3/uL (ref 0.0–0.1)
Basophils Relative: 0 %
Eosinophils Absolute: 0.1 10*3/uL (ref 0.0–0.5)
Eosinophils Relative: 1 %
HCT: 32.5 % — ABNORMAL LOW (ref 36.0–46.0)
Hemoglobin: 9.7 g/dL — ABNORMAL LOW (ref 12.0–15.0)
Immature Granulocytes: 0 %
Lymphocytes Relative: 14 %
Lymphs Abs: 1.7 10*3/uL (ref 0.7–4.0)
MCH: 23.8 pg — ABNORMAL LOW (ref 26.0–34.0)
MCHC: 29.8 g/dL — ABNORMAL LOW (ref 30.0–36.0)
MCV: 79.9 fL — ABNORMAL LOW (ref 80.0–100.0)
Monocytes Absolute: 0.5 10*3/uL (ref 0.1–1.0)
Monocytes Relative: 4 %
Neutro Abs: 10.1 10*3/uL — ABNORMAL HIGH (ref 1.7–7.7)
Neutrophils Relative %: 81 %
Platelet Count: 267 10*3/uL (ref 150–400)
RBC: 4.07 MIL/uL (ref 3.87–5.11)
RDW: 23.7 % — ABNORMAL HIGH (ref 11.5–15.5)
WBC Count: 12.5 10*3/uL — ABNORMAL HIGH (ref 4.0–10.5)
nRBC: 0 % (ref 0.0–0.2)

## 2020-02-25 LAB — FERRITIN: Ferritin: 90 ng/mL (ref 11–307)

## 2020-02-25 MED ORDER — SODIUM CHLORIDE 0.9% FLUSH
10.0000 mL | Freq: Once | INTRAVENOUS | Status: AC
  Administered 2020-02-25: 10 mL
  Filled 2020-02-25: qty 10

## 2020-02-25 MED ORDER — SODIUM CHLORIDE 0.9 % IV SOLN
250.0000 mg | Freq: Once | INTRAVENOUS | Status: AC
  Administered 2020-02-25: 250 mg via INTRAVENOUS
  Filled 2020-02-25: qty 20

## 2020-02-25 MED ORDER — ALTEPLASE 2 MG IJ SOLR
2.0000 mg | Freq: Once | INTRAMUSCULAR | Status: DC | PRN
  Filled 2020-02-25: qty 2

## 2020-02-25 MED ORDER — HEPARIN SOD (PORK) LOCK FLUSH 100 UNIT/ML IV SOLN
250.0000 [IU] | Freq: Once | INTRAVENOUS | Status: AC | PRN
  Administered 2020-02-25: 500 [IU]
  Filled 2020-02-25: qty 5

## 2020-02-25 MED ORDER — SODIUM CHLORIDE 0.9 % IV SOLN
INTRAVENOUS | Status: DC
  Filled 2020-02-25: qty 250

## 2020-02-25 MED ORDER — SODIUM CHLORIDE 0.9% FLUSH
3.0000 mL | Freq: Once | INTRAVENOUS | Status: AC | PRN
  Administered 2020-02-25: 10 mL
  Filled 2020-02-25: qty 10

## 2020-02-25 NOTE — Progress Notes (Signed)
1701 : Pt stayed 10 minutes post iron infusion. She refused the full 30 minute observation. VSS at discharge.

## 2020-02-25 NOTE — Patient Instructions (Signed)
Sodium Ferric Gluconate Complex injection What is this medicine? SODIUM FERRIC GLUCONATE COMPLEX (SOE dee um FER ik GLOO koe nate KOM pleks) is an iron replacement. It is used with epoetin therapy to treat low iron levels in patients who are receiving hemodialysis. This medicine may be used for other purposes; ask your health care provider or pharmacist if you have questions. COMMON BRAND NAME(S): Ferrlecit, Nulecit What should I tell my health care provider before I take this medicine? They need to know if you have any of the following conditions:  anemia that is not from iron deficiency  high levels of iron in the body  an unusual or allergic reaction to iron, benzyl alcohol, other medicines, foods, dyes, or preservatives  pregnant or are trying to become pregnant  breast-feeding How should I use this medicine? This medicine is for infusion into a vein. It is given by a health care professional in a hospital or clinic setting. Talk to your pediatrician regarding the use of this medicine in children. While this drug may be prescribed for children as young as 6 years old for selected conditions, precautions do apply. Overdosage: If you think you have taken too much of this medicine contact a poison control center or emergency room at once. NOTE: This medicine is only for you. Do not share this medicine with others. What if I miss a dose? It is important not to miss your dose. Call your doctor or health care professional if you are unable to keep an appointment. What may interact with this medicine? Do not take this medicine with any of the following medications:  deferoxamine  dimercaprol  other iron products This medicine may also interact with the following medications:  chloramphenicol  deferasirox  medicine for blood pressure like enalapril This list may not describe all possible interactions. Give your health care provider a list of all the medicines, herbs,  non-prescription drugs, or dietary supplements you use. Also tell them if you smoke, drink alcohol, or use illegal drugs. Some items may interact with your medicine. What should I watch for while using this medicine? Your condition will be monitored carefully while you are receiving this medicine. Visit your doctor for check-ups as directed. What side effects may I notice from receiving this medicine? Side effects that you should report to your doctor or health care professional as soon as possible:  allergic reactions like skin rash, itching or hives, swelling of the face, lips, or tongue  breathing problems  changes in hearing  changes in vision  chills, flushing, or sweating  fast, irregular heartbeat  feeling faint or lightheaded, falls  fever, flu-like symptoms  high or low blood pressure  pain, tingling, numbness in the hands or feet  severe pain in the chest, back, flanks, or groin  swelling of the ankles, feet, hands  trouble passing urine or change in the amount of urine  unusually weak or tired Side effects that usually do not require medical attention (report to your doctor or health care professional if they continue or are bothersome):  cramps  dark colored stools  diarrhea  headache  nausea, vomiting  stomach upset This list may not describe all possible side effects. Call your doctor for medical advice about side effects. You may report side effects to FDA at 1-800-FDA-1088. Where should I keep my medicine? This drug is given in a hospital or clinic and will not be stored at home. NOTE: This sheet is a summary. It may not cover all   possible information. If you have questions about this medicine, talk to your doctor, pharmacist, or health care provider.  2020 Elsevier/Gold Standard (2007-12-02 15:58:57)     Hypokalemia Hypokalemia means that the amount of potassium in the blood is lower than normal. Potassium is a chemical (electrolyte) that  helps regulate the amount of fluid in the body. It also stimulates muscle tightening (contraction) and helps nerves work properly. Normally, most of the body's potassium is inside cells, and only a very small amount is in the blood. Because the amount in the blood is so small, minor changes to potassium levels in the blood can be life-threatening. What are the causes? This condition may be caused by:  Antibiotic medicine.  Diarrhea or vomiting. Taking too much of a medicine that helps you have a bowel movement (laxative) can cause diarrhea and lead to hypokalemia.  Chronic kidney disease (CKD).  Medicines that help the body get rid of excess fluid (diuretics).  Eating disorders, such as bulimia.  Low magnesium levels in the body.  Sweating a lot. What are the signs or symptoms? Symptoms of this condition include:  Weakness.  Constipation.  Fatigue.  Muscle cramps.  Mental confusion.  Skipped heartbeats or irregular heartbeat (palpitations).  Tingling or numbness. How is this diagnosed? This condition is diagnosed with a blood test. How is this treated? This condition may be treated by:  Taking potassium supplements by mouth.  Adjusting the medicines that you take.  Eating more foods that contain a lot of potassium. If your potassium level is very low, you may need to get potassium through an IV and be monitored in the hospital. Follow these instructions at home:   Take over-the-counter and prescription medicines only as told by your health care provider. This includes vitamins and supplements.  Eat a healthy diet. A healthy diet includes fresh fruits and vegetables, whole grains, healthy fats, and lean proteins.  If instructed, eat more foods that contain a lot of potassium. This includes: ? Nuts, such as peanuts and pistachios. ? Seeds, such as sunflower seeds and pumpkin seeds. ? Peas, lentils, and lima beans. ? Whole grain and bran cereals and  breads. ? Fresh fruits and vegetables, such as apricots, avocado, bananas, cantaloupe, kiwi, oranges, tomatoes, asparagus, and potatoes. ? Orange juice. ? Tomato juice. ? Red meats. ? Yogurt.  Keep all follow-up visits as told by your health care provider. This is important. Contact a health care provider if you:  Have weakness that gets worse.  Feel your heart pounding or racing.  Vomit.  Have diarrhea.  Have diabetes (diabetes mellitus) and you have trouble keeping your blood sugar (glucose) in your target range. Get help right away if you:  Have chest pain.  Have shortness of breath.  Have vomiting or diarrhea that lasts for more than 2 days.  Faint. Summary  Hypokalemia means that the amount of potassium in the blood is lower than normal.  This condition is diagnosed with a blood test.  Hypokalemia may be treated by taking potassium supplements, adjusting the medicines that you take, or eating more foods that are high in potassium.  If your potassium level is very low, you may need to get potassium through an IV and be monitored in the hospital. This information is not intended to replace advice given to you by your health care provider. Make sure you discuss any questions you have with your health care provider. Document Revised: 11/12/2017 Document Reviewed: 11/12/2017 Elsevier Patient Education  2020 Elsevier  Inc.  

## 2020-02-28 DIAGNOSIS — M1990 Unspecified osteoarthritis, unspecified site: Secondary | ICD-10-CM | POA: Diagnosis not present

## 2020-02-29 DIAGNOSIS — D509 Iron deficiency anemia, unspecified: Secondary | ICD-10-CM | POA: Diagnosis not present

## 2020-02-29 DIAGNOSIS — K922 Gastrointestinal hemorrhage, unspecified: Secondary | ICD-10-CM | POA: Diagnosis not present

## 2020-02-29 DIAGNOSIS — M1990 Unspecified osteoarthritis, unspecified site: Secondary | ICD-10-CM | POA: Diagnosis not present

## 2020-02-29 DIAGNOSIS — Z8719 Personal history of other diseases of the digestive system: Secondary | ICD-10-CM | POA: Diagnosis not present

## 2020-02-29 DIAGNOSIS — K635 Polyp of colon: Secondary | ICD-10-CM | POA: Diagnosis not present

## 2020-03-01 ENCOUNTER — Encounter (HOSPITAL_COMMUNITY): Payer: Self-pay | Admitting: *Deleted

## 2020-03-01 ENCOUNTER — Inpatient Hospital Stay (HOSPITAL_COMMUNITY)
Admission: EM | Admit: 2020-03-01 | Discharge: 2020-03-03 | DRG: 378 | Disposition: A | Discharge status: Home Health | Payer: Medicaid Other | Attending: Internal Medicine | Admitting: Internal Medicine

## 2020-03-01 ENCOUNTER — Ambulatory Visit: Payer: Medicaid Other | Admitting: Podiatry

## 2020-03-01 ENCOUNTER — Other Ambulatory Visit: Payer: Self-pay

## 2020-03-01 DIAGNOSIS — Z20822 Contact with and (suspected) exposure to covid-19: Secondary | ICD-10-CM | POA: Diagnosis present

## 2020-03-01 DIAGNOSIS — K922 Gastrointestinal hemorrhage, unspecified: Secondary | ICD-10-CM | POA: Diagnosis not present

## 2020-03-01 DIAGNOSIS — D649 Anemia, unspecified: Secondary | ICD-10-CM | POA: Diagnosis present

## 2020-03-01 DIAGNOSIS — Z841 Family history of disorders of kidney and ureter: Secondary | ICD-10-CM

## 2020-03-01 DIAGNOSIS — E876 Hypokalemia: Secondary | ICD-10-CM | POA: Diagnosis present

## 2020-03-01 DIAGNOSIS — Z888 Allergy status to other drugs, medicaments and biological substances status: Secondary | ICD-10-CM | POA: Diagnosis not present

## 2020-03-01 DIAGNOSIS — D72829 Elevated white blood cell count, unspecified: Secondary | ICD-10-CM | POA: Diagnosis not present

## 2020-03-01 DIAGNOSIS — Z886 Allergy status to analgesic agent status: Secondary | ICD-10-CM

## 2020-03-01 DIAGNOSIS — K635 Polyp of colon: Secondary | ICD-10-CM | POA: Diagnosis present

## 2020-03-01 DIAGNOSIS — M25562 Pain in left knee: Secondary | ICD-10-CM

## 2020-03-01 DIAGNOSIS — I10 Essential (primary) hypertension: Secondary | ICD-10-CM | POA: Diagnosis present

## 2020-03-01 DIAGNOSIS — E785 Hyperlipidemia, unspecified: Secondary | ICD-10-CM | POA: Diagnosis present

## 2020-03-01 DIAGNOSIS — M79604 Pain in right leg: Secondary | ICD-10-CM | POA: Diagnosis not present

## 2020-03-01 DIAGNOSIS — M7989 Other specified soft tissue disorders: Secondary | ICD-10-CM | POA: Diagnosis not present

## 2020-03-01 DIAGNOSIS — Z79899 Other long term (current) drug therapy: Secondary | ICD-10-CM | POA: Diagnosis not present

## 2020-03-01 DIAGNOSIS — I780 Hereditary hemorrhagic telangiectasia: Secondary | ICD-10-CM | POA: Diagnosis present

## 2020-03-01 DIAGNOSIS — Z833 Family history of diabetes mellitus: Secondary | ICD-10-CM

## 2020-03-01 DIAGNOSIS — Z6841 Body Mass Index (BMI) 40.0 and over, adult: Secondary | ICD-10-CM | POA: Diagnosis not present

## 2020-03-01 DIAGNOSIS — E66813 Obesity, class 3: Secondary | ICD-10-CM | POA: Diagnosis present

## 2020-03-01 DIAGNOSIS — M1712 Unilateral primary osteoarthritis, left knee: Secondary | ICD-10-CM | POA: Diagnosis not present

## 2020-03-01 DIAGNOSIS — F17210 Nicotine dependence, cigarettes, uncomplicated: Secondary | ICD-10-CM | POA: Diagnosis present

## 2020-03-01 DIAGNOSIS — F419 Anxiety disorder, unspecified: Secondary | ICD-10-CM | POA: Diagnosis present

## 2020-03-01 DIAGNOSIS — Z7984 Long term (current) use of oral hypoglycemic drugs: Secondary | ICD-10-CM | POA: Diagnosis not present

## 2020-03-01 DIAGNOSIS — Q2733 Arteriovenous malformation of digestive system vessel: Secondary | ICD-10-CM | POA: Diagnosis present

## 2020-03-01 DIAGNOSIS — K449 Diaphragmatic hernia without obstruction or gangrene: Secondary | ICD-10-CM | POA: Diagnosis not present

## 2020-03-01 DIAGNOSIS — K5520 Angiodysplasia of colon without hemorrhage: Secondary | ICD-10-CM | POA: Diagnosis not present

## 2020-03-01 DIAGNOSIS — K5521 Angiodysplasia of colon with hemorrhage: Secondary | ICD-10-CM | POA: Diagnosis not present

## 2020-03-01 DIAGNOSIS — E1159 Type 2 diabetes mellitus with other circulatory complications: Secondary | ICD-10-CM | POA: Diagnosis present

## 2020-03-01 DIAGNOSIS — D500 Iron deficiency anemia secondary to blood loss (chronic): Secondary | ICD-10-CM | POA: Diagnosis present

## 2020-03-01 DIAGNOSIS — R531 Weakness: Secondary | ICD-10-CM | POA: Diagnosis not present

## 2020-03-01 DIAGNOSIS — E119 Type 2 diabetes mellitus without complications: Secondary | ICD-10-CM | POA: Diagnosis present

## 2020-03-01 DIAGNOSIS — M1990 Unspecified osteoarthritis, unspecified site: Secondary | ICD-10-CM | POA: Diagnosis not present

## 2020-03-01 DIAGNOSIS — K921 Melena: Secondary | ICD-10-CM | POA: Diagnosis not present

## 2020-03-01 DIAGNOSIS — D62 Acute posthemorrhagic anemia: Secondary | ICD-10-CM | POA: Diagnosis present

## 2020-03-01 DIAGNOSIS — K219 Gastro-esophageal reflux disease without esophagitis: Secondary | ICD-10-CM | POA: Diagnosis present

## 2020-03-01 DIAGNOSIS — Z832 Family history of diseases of the blood and blood-forming organs and certain disorders involving the immune mechanism: Secondary | ICD-10-CM | POA: Diagnosis not present

## 2020-03-01 DIAGNOSIS — D509 Iron deficiency anemia, unspecified: Secondary | ICD-10-CM | POA: Diagnosis not present

## 2020-03-01 DIAGNOSIS — F339 Major depressive disorder, recurrent, unspecified: Secondary | ICD-10-CM | POA: Diagnosis not present

## 2020-03-01 DIAGNOSIS — K31811 Angiodysplasia of stomach and duodenum with bleeding: Secondary | ICD-10-CM | POA: Diagnosis not present

## 2020-03-01 DIAGNOSIS — K625 Hemorrhage of anus and rectum: Secondary | ICD-10-CM | POA: Diagnosis not present

## 2020-03-01 LAB — CBC WITH DIFFERENTIAL/PLATELET
Abs Immature Granulocytes: 0.06 10*3/uL (ref 0.00–0.07)
Basophils Absolute: 0.1 10*3/uL (ref 0.0–0.1)
Basophils Relative: 0 %
Eosinophils Absolute: 0.1 10*3/uL (ref 0.0–0.5)
Eosinophils Relative: 1 %
HCT: 23.1 % — ABNORMAL LOW (ref 36.0–46.0)
Hemoglobin: 6.6 g/dL — CL (ref 12.0–15.0)
Immature Granulocytes: 0 %
Lymphocytes Relative: 15 %
Lymphs Abs: 2.2 10*3/uL (ref 0.7–4.0)
MCH: 24.7 pg — ABNORMAL LOW (ref 26.0–34.0)
MCHC: 28.6 g/dL — ABNORMAL LOW (ref 30.0–36.0)
MCV: 86.5 fL (ref 80.0–100.0)
Monocytes Absolute: 0.6 10*3/uL (ref 0.1–1.0)
Monocytes Relative: 4 %
Neutro Abs: 11.9 10*3/uL — ABNORMAL HIGH (ref 1.7–7.7)
Neutrophils Relative %: 80 %
Platelets: 253 10*3/uL (ref 150–400)
RBC: 2.67 MIL/uL — ABNORMAL LOW (ref 3.87–5.11)
RDW: 26.2 % — ABNORMAL HIGH (ref 11.5–15.5)
WBC: 14.9 10*3/uL — ABNORMAL HIGH (ref 4.0–10.5)
nRBC: 0.2 % (ref 0.0–0.2)

## 2020-03-01 LAB — COMPREHENSIVE METABOLIC PANEL
ALT: 10 U/L (ref 0–44)
AST: 17 U/L (ref 15–41)
Albumin: 3.4 g/dL — ABNORMAL LOW (ref 3.5–5.0)
Alkaline Phosphatase: 58 U/L (ref 38–126)
Anion gap: 9 (ref 5–15)
BUN: 20 mg/dL (ref 6–20)
CO2: 26 mmol/L (ref 22–32)
Calcium: 8.8 mg/dL — ABNORMAL LOW (ref 8.9–10.3)
Chloride: 106 mmol/L (ref 98–111)
Creatinine, Ser: 0.91 mg/dL (ref 0.44–1.00)
GFR, Estimated: >60 mL/min (ref >60)
Glucose, Bld: 112 mg/dL — ABNORMAL HIGH (ref 70–99)
Potassium: 3.3 mmol/L — ABNORMAL LOW (ref 3.5–5.1)
Sodium: 141 mmol/L (ref 135–145)
Total Bilirubin: 0.5 mg/dL (ref 0.3–1.2)
Total Protein: 6.5 g/dL (ref 6.5–8.1)

## 2020-03-01 LAB — GLUCOSE, CAPILLARY: Glucose-Capillary: 85 mg/dL (ref 70–99)

## 2020-03-01 LAB — PROTIME-INR
INR: 1.1 (ref 0.8–1.2)
Prothrombin Time: 13.4 seconds (ref 11.4–15.2)

## 2020-03-01 LAB — RESPIRATORY PANEL BY RT PCR (FLU A&B, COVID)
Influenza A by PCR: NEGATIVE
Influenza B by PCR: NEGATIVE
SARS Coronavirus 2 by RT PCR: NEGATIVE

## 2020-03-01 MED ORDER — ONDANSETRON HCL 4 MG/2ML IJ SOLN: 4.0000 mg | Freq: Four times a day (QID) | INTRAMUSCULAR | Status: DC | PRN

## 2020-03-01 MED ORDER — SERTRALINE HCL 100 MG PO TABS
100.0000 mg | ORAL_TABLET | Freq: Every day | ORAL | Status: DC
  Administered 2020-03-02 – 2020-03-03 (×2): 100 mg via ORAL
  Filled 2020-03-01 (×2): qty 1

## 2020-03-01 MED ORDER — ALPRAZOLAM 0.5 MG PO TABS: 0.5000 mg | ORAL_TABLET | Freq: Every evening | ORAL | Status: DC | PRN

## 2020-03-01 MED ORDER — SODIUM CHLORIDE 0.9 % IV SOLN: INTRAVENOUS | Status: DC

## 2020-03-01 MED ORDER — ONDANSETRON HCL 4 MG PO TABS: 4.0000 mg | ORAL_TABLET | Freq: Four times a day (QID) | ORAL | Status: DC | PRN

## 2020-03-01 MED ORDER — ACETAMINOPHEN 325 MG PO TABS
650.0000 mg | ORAL_TABLET | Freq: Four times a day (QID) | ORAL | Status: DC | PRN
  Administered 2020-03-02: 650 mg via ORAL
  Filled 2020-03-01: qty 2

## 2020-03-01 MED ORDER — PEG 3350-KCL-NA BICARB-NACL 420 G PO SOLR
4000.0000 mL | Freq: Once | ORAL | Status: AC
  Administered 2020-03-01: 4000 mL via ORAL

## 2020-03-01 MED ORDER — ACETAMINOPHEN 650 MG RE SUPP: 650.0000 mg | Freq: Four times a day (QID) | RECTAL | Status: DC | PRN

## 2020-03-01 MED ORDER — PANTOPRAZOLE SODIUM 40 MG IV SOLR
40.0000 mg | Freq: Two times a day (BID) | INTRAVENOUS | Status: DC
  Administered 2020-03-01 – 2020-03-03 (×4): 40 mg via INTRAVENOUS
  Filled 2020-03-01 (×4): qty 40

## 2020-03-01 MED ORDER — AMINOCAPROIC ACID 500 MG PO TABS
1000.0000 mg | ORAL_TABLET | Freq: Two times a day (BID) | ORAL | Status: DC
  Administered 2020-03-01 – 2020-03-03 (×4): 1000 mg via ORAL
  Filled 2020-03-01 (×4): qty 2

## 2020-03-01 MED ORDER — SODIUM CHLORIDE 0.9 % IV SOLN: 10.0000 mL/h | Freq: Once | INTRAVENOUS | Status: DC

## 2020-03-01 MED ORDER — ATORVASTATIN CALCIUM 40 MG PO TABS
80.0000 mg | ORAL_TABLET | Freq: Every day | ORAL | Status: DC
  Administered 2020-03-02 – 2020-03-03 (×2): 80 mg via ORAL
  Filled 2020-03-01 (×2): qty 2

## 2020-03-01 MED ORDER — POTASSIUM CHLORIDE 20 MEQ PO PACK
20.0000 meq | PACK | Freq: Two times a day (BID) | ORAL | Status: DC
  Administered 2020-03-01 – 2020-03-03 (×4): 20 meq via ORAL
  Filled 2020-03-01 (×4): qty 1

## 2020-03-01 MED ORDER — INSULIN ASPART 100 UNIT/ML ~~LOC~~ SOLN
0.0000 [IU] | Freq: Three times a day (TID) | SUBCUTANEOUS | Status: DC
  Administered 2020-03-03: 2 [IU] via SUBCUTANEOUS

## 2020-03-01 NOTE — Consult Note (Signed)
Referring Provider: ED Primary Care Physician:  [name], MD Primary Gastroenterologist:  Dr. Cristina Gong [facility])  Reason for Consultation:  Rectal bleeding  HPI: Peggy House is a 59 y.o. female with hereditary hemorrhagic telangiectasias and GI bleeding presenting for consultation of rectal bleeding.  Patient had 2 episodes of hematochezia Sunday 11/14.  She states she then had 1 smaller and lighter episode of hematochezia on 11/15.  On 11/16, she had 2 episodes of melena.  She has not had any bowel movements thus far today.  She has some nausea.  Denies any abdominal pain, vomiting, hematemesis, dysphagia, changes in appetite, unexplained weight loss.  Reports faitgue/weakness but denies chest pain or shortness of breath.  Was on meloxicam for 1 week for knee pain, stopped 02/11/20.  Denies other NSAID use.  Denies blood thinner use.  She is followed by Dr. Burr Medico. She received blood transfusion for severe anemia secondary to GI bleedingas needed, IVferric gluconate every1-2 weeks as neededwith ferritin goal 100-200,Avastin/Zirabev q2weeks.  She has a history of IR embolization ofof the left gastric and short gastric artery branch vessels without evidence of residual flow in the Dieulafoy lesionon 12/10/17.  She has had numerous EGDs with APC.  Last EGD 11/2018: Dieulafoy lesion of stomach, acute gastritis.  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 Duodenal ulcer per EGD 09/22/17.   Last colonoscopy 04/2014: multiple small polyps, bx: tubular adenomas   Past Medical History:  Diagnosis Date  . Anxiety   . Arthritis    knnes,back  . GERD (gastroesophageal reflux disease)   . Hereditary hemorrhagic telangiectasia ([place])   . History of swelling of feet   . Hyperlipidemia   . Hypertension   . Major depressive disorder, recurrent episode ([place]) 06/05/2015  . Obesity   . Snores    . 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 ABDOMINAL HYSTERECTOMY    . CARPAL TUNNEL RELEASE  05/13/2011   Procedure: CARPAL TUNNEL RELEASE;  Surgeon: Nita Sells, MD;  Location: [place];  Service: Orthopedics;  Laterality: Left;  . COLONOSCOPY WITH PROPOFOL N/A 04/28/2014   Procedure: COLONOSCOPY WITH PROPOFOL;  Surgeon: Cleotis Nipper, MD;  Location: [facility] ENDOSCOPY;  Service: Endoscopy;  Laterality: N/A;  . DG TOES*L*  2/10   rt  . DILATION AND CURETTAGE OF UTERUS    . ENTEROSCOPY N/A 10/17/2017   Procedure: ENTEROSCOPY;  Surgeon: Otis Brace, MD;  Location: [place] ENDOSCOPY;  Service: Gastroenterology;  Laterality: N/A;  . ESOPHAGOGASTRODUODENOSCOPY N/A 04/10/2014   Procedure: ESOPHAGOGASTRODUODENOSCOPY (EGD);  Surgeon: [name], MD;  Location: [facility] ENDOSCOPY;  Service: Endoscopy;  Laterality: N/A;  . ESOPHAGOGASTRODUODENOSCOPY N/A 05/10/2017   Procedure: ESOPHAGOGASTRODUODENOSCOPY (EGD);  Surgeon: Ronald Lobo, MD;  Location: [facility] ENDOSCOPY;  Service: Endoscopy;  Laterality: N/A;  . ESOPHAGOGASTRODUODENOSCOPY N/A 09/22/2017   Procedure: ESOPHAGOGASTRODUODENOSCOPY (EGD);  Surgeon: Clarene Essex, MD;  Location: [place];  Service: Endoscopy;  Laterality: N/A;  bedside  . ESOPHAGOGASTRODUODENOSCOPY (EGD) WITH PROPOFOL N/A 04/27/2014   Procedure: ESOPHAGOGASTRODUODENOSCOPY (EGD) WITH PROPOFOL;  Surgeon: Cleotis Nipper, MD;  Location: [place];  Service: Endoscopy;  Laterality: N/A;  possible apc  . ESOPHAGOGASTRODUODENOSCOPY (EGD) WITH PROPOFOL N/A 09/30/2017   Procedure: ESOPHAGOGASTRODUODENOSCOPY (EGD) WITH PROPOFOL;  Surgeon: [name], MD;  Location: [place];  Service: Gastroenterology;  Laterality: N/A;  . ESOPHAGOGASTRODUODENOSCOPY (EGD) WITH PROPOFOL  N/A 10/01/2017   Procedure: ESOPHAGOGASTRODUODENOSCOPY (EGD) WITH PROPOFOL;  Surgeon: [name], MD;   Location: [place];  Service: Gastroenterology;  Laterality: N/A;  . ESOPHAGOGASTRODUODENOSCOPY (EGD) WITH PROPOFOL N/A 10/08/2017   Procedure: ESOPHAGOGASTRODUODENOSCOPY (EGD) WITH PROPOFOL;  Surgeon: Otis Brace, MD;  Location: [facility] ENDOSCOPY;  Service: Gastroenterology;  Laterality: N/A;  . ESOPHAGOGASTRODUODENOSCOPY (EGD) WITH PROPOFOL N/A 10/17/2017   Procedure: ESOPHAGOGASTRODUODENOSCOPY (EGD) WITH PROPOFOL;  Surgeon: Otis Brace, MD;  Location: [facility] ENDOSCOPY;  Service: Gastroenterology;  Laterality: N/A;  . ESOPHAGOGASTRODUODENOSCOPY (EGD) WITH PROPOFOL N/A 10/19/2017   Procedure: ESOPHAGOGASTRODUODENOSCOPY (EGD) WITH PROPOFOL;  Surgeon: Otis Brace, MD;  Location: [facility] ENDOSCOPY;  Service: Gastroenterology;  Laterality: N/A;  . ESOPHAGOGASTRODUODENOSCOPY (EGD) WITH PROPOFOL N/A 12/04/2018   Procedure: ESOPHAGOGASTRODUODENOSCOPY (EGD) WITH PROPOFOL;  Surgeon: Wilford Corner, MD;  Location: [facility] ENDOSCOPY;  Service: Endoscopy;  Laterality: N/A;  . GIVENS CAPSULE STUDY N/A 10/02/2017   Procedure: GIVENS CAPSULE STUDY;  Surgeon: [name], MD;  Location: [place];  Service: Gastroenterology;  Laterality: N/A;  . GIVENS CAPSULE STUDY N/A 10/08/2017   Procedure: GIVENS CAPSULE STUDY;  Surgeon: Otis Brace, MD;  Location: [place];  Service: Gastroenterology;  Laterality: N/A;  endoscopic placement of capsule  . HEMOSTASIS CLIP PLACEMENT  12/04/2018   Procedure: HEMOSTASIS CLIP PLACEMENT;  Surgeon: Wilford Corner, MD;  Location: [facility] ENDOSCOPY;  Service: Endoscopy;;  . HOT HEMOSTASIS N/A 04/27/2014   Procedure: HOT HEMOSTASIS (ARGON PLASMA COAGULATION/BICAP);  Surgeon: Cleotis Nipper, MD;  Location: [facility] ENDOSCOPY;  Service: Endoscopy;  Laterality: N/A;  . HOT HEMOSTASIS N/A 09/30/2017   Procedure: HOT HEMOSTASIS (ARGON PLASMA COAGULATION/BICAP);  Surgeon: [name], MD;  Location: [place];  Service: Gastroenterology;  Laterality: N/A;  . HOT HEMOSTASIS N/A  10/01/2017   Procedure: HOT HEMOSTASIS (ARGON PLASMA COAGULATION/BICAP);  Surgeon: [name], MD;  Location: [place];  Service: Gastroenterology;  Laterality: N/A;  . HOT HEMOSTASIS N/A 10/17/2017   Procedure: HOT HEMOSTASIS (ARGON PLASMA COAGULATION/BICAP);  Surgeon: Otis Brace, MD;  Location: [facility] ENDOSCOPY;  Service: Gastroenterology;  Laterality: N/A;  . HOT HEMOSTASIS N/A 10/19/2017   Procedure: HOT HEMOSTASIS (ARGON PLASMA COAGULATION/BICAP);  Surgeon: Otis Brace, MD;  Location: [facility] ENDOSCOPY;  Service: Gastroenterology;  Laterality: N/A;  . IR IMAGING GUIDED PORT INSERTION  07/08/2018  . L shoulder Surgery  2011  . SUBMUCOSAL INJECTION  09/22/2017   Procedure: SUBMUCOSAL INJECTION;  Surgeon: Clarene Essex, MD;  Location: [facility] ENDOSCOPY;  Service: Endoscopy;;  . SUBMUCOSAL INJECTION  12/04/2018   Procedure: SUBMUCOSAL INJECTION;  Surgeon: Wilford Corner, MD;  Location: [facility] ENDOSCOPY;  Service: Endoscopy;;    Prior to Admission medications   Medication Sig Start Date End Date Taking? Authorizing Provider  acitretin (SORIATANE) 10 MG capsule Take 10 mg by mouth daily. 01/03/20  Yes [provider]  albuterol (PROVENTIL HFA;VENTOLIN HFA) 108 (90 Base) MCG/ACT inhaler Inhale 1-2 puffs into the lungs every 6 (six) hours as needed for wheezing or shortness of breath. 04/03/17  Yes Molt, Bethany, DO  ALPRAZolam (XANAX) 0.5 MG tablet Take 1 tablet (0.5 mg total) by mouth at bedtime as needed for anxiety. 01/31/20  Yes Truitt Merle, MD  Aminocaproic Acid 1000 MG TABS TAKE 1 TABLET(1000 MG) BY MOUTH TWICE DAILY 02/11/20  Yes Truitt Merle, MD  amLODipine (NORVASC) 10 MG tablet TAKE 1 TABLET(10 MG) BY MOUTH DAILY 01/31/20  Yes Alla Feeling, NP  atorvastatin (LIPITOR) 80 MG tablet TAKE 1 TABLET(80 MG) BY MOUTH DAILY Patient taking differently: Take 80 mg by mouth daily. TAKE 1  TABLET(80 MG) BY MOUTH DAILY 12/16/19  Yes Truitt Merle, MD  cetirizine (ZYRTEC) 10 MG tablet TAKE 1 TABLET(10 MG) BY  MOUTH DAILY Patient taking differently: Take 10 mg by mouth daily.  07/09/19  Yes [name], MD  diphenhydramine-acetaminophen (TYLENOL PM) 25-500 MG TABS tablet Take 1 tablet by mouth at bedtime as needed (sleep).   Yes [provider]  diphenoxylate-atropine (LOMOTIL) 2.5-0.025 MG tablet 1 to 2 PO QID prn diarrhea Patient taking differently: Take 1-2 tablets by mouth 4 (four) times daily as needed for diarrhea or loose stools.  04/15/19  Yes Tanner, Lyndon Code., PA-C  hydrochlorothiazide (HYDRODIURIL) 12.5 MG tablet TAKE 1 TABLET(12.5 MG) BY MOUTH DAILY Patient taking differently: Take 12.5 mg by mouth daily. TAKE 1 TABLET(12.5 MG) BY MOUTH DAILY 11/11/19  Yes Lacinda Axon, MD  metFORMIN (GLUCOPHAGE) 500 MG tablet Take 1 tablet (500 mg total) by mouth 2 (two) times daily with a meal. 10/06/19  Yes [name], MD  pantoprazole (PROTONIX) 40 MG tablet Take 1 tablet (40 mg total) by mouth 2 (two) times daily. 03/02/19  Yes [name], MD  Podiatric Products (FLEXITOL HEEL BALM) OINT Apply 1 application topically as needed (dry skin).  06/10/19  Yes [provider]  potassium chloride (KLOR-CON) 20 MEQ packet Take 20 mEq by mouth 2 (two) times daily. 01/06/20  Yes Alla Feeling, NP  prochlorperazine (COMPAZINE) 10 MG tablet Take 1 tablet (10 mg total) by mouth every 6 (six) hours as needed for nausea or vomiting. 10/14/18  Yes Alla Feeling, NP  sertraline (ZOLOFT) 100 MG tablet TAKE 1 TABLET(100 MG) BY MOUTH DAILY Patient taking differently: Take 100 mg by mouth daily.  02/02/20  Yes Marianna Payment, MD  Accu-Chek Softclix Lancets lancets Use to check blood sugar before breakfast and before dinner while on steroids 09/27/19   [name], MD  cyclobenzaprine (FLEXERIL) 10 MG tablet Take 1 tablet (10 mg total) by mouth 2 (two) times daily as needed for up to 20 doses for muscle spasms. Patient not taking: Reported on 03/01/2020 09/18/19   Lennice Sites, DO   glucose blood (ACCU-CHEK GUIDE) test strip Check blood sugar 2 times per day while on steroids before breakfast and dinner 09/27/19   [name], MD  lidocaine-prilocaine (EMLA) cream Apply 1 application topically as needed. 07/17/18   Truitt Merle, MD  traZODone (DESYREL) 50 MG tablet TAKE 1 TABLET(50 MG) BY MOUTH AT BEDTIME Patient not taking: Reported on 03/01/2020 08/16/19   [name], MD    Scheduled Meds: Continuous Infusions: . sodium chloride    . sodium chloride     PRN Meds:.  Allergies as of 03/01/2020 - Review Complete 03/01/2020  Allergen Reaction Noted  . Feraheme [ferumoxytol] Other (See Comments) 10/09/2017  . Nsaids Other (See Comments) 04/25/2014  . Tomato Hives 01/01/2012  . Wasp venom Swelling 10/06/2017    Family History  Problem Relation Age of Onset  . Cancer Mother        Ovarian  . Ovarian cancer Mother   . Diabetes Mother   . Kidney disease Mother   . Bleeding Disorder Mother   . Diabetes type II Sister   . Bleeding Disorder Sister   . Diabetes Sister   . Colon cancer Maternal Grandfather   . Crohn's disease Maternal Grandfather   . Stomach cancer Maternal Grandmother   . Kidney disease Son   . Bleeding Disorder Son   . Dysmenorrhea Neg Hx  Social History   Socioeconomic History  . Marital status: Single    Spouse name: Not on file  . Number of children: Not on file  . Years of education: Not on file  . Highest education level: Not on file  Occupational History  . Not on file  Tobacco Use  . Smoking status: Current Every Day Smoker    Packs/day: 0.50    Years: 30.00    Pack years: 15.00    Types: Cigarettes  . Smokeless tobacco: Former Systems developer    Types: Snuff    Quit date: 35  . Tobacco comment: 1/2 PPD  Vaping Use  . Vaping Use: Never used  Substance and Sexual Activity  . Alcohol use: No    Alcohol/week: 0.0 standard drinks  . Drug use: No    Comment: last cocaine-2010  . Sexual activity: Yes    Birth  control/protection: Surgical    Comment: Hysterectomy  Other Topics Concern  . Not on file  Social History Narrative  . Not on file   Social Determinants of Health   Financial Resource Strain:   . Difficulty of Paying Living Expenses: Not on file  Food Insecurity:   . Worried About Charity fundraiser in the Last Year: Not on file  . Ran Out of Food in the Last Year: Not on file  Transportation Needs:   . Lack of Transportation (Medical): Not on file  . Lack of Transportation (Non-Medical): Not on file  Physical Activity:   . Days of Exercise per Week: Not on file  . Minutes of Exercise per Session: Not on file  Stress:   . Feeling of Stress : Not on file  Social Connections:   . Frequency of Communication with Friends and Family: Not on file  . Frequency of Social Gatherings with Friends and Family: Not on file  . Attends Religious Services: Not on file  . Active Member of Clubs or Organizations: Not on file  . Attends Archivist Meetings: Not on file  . Marital Status: Not on file  Intimate Partner Violence:   . Fear of Current or Ex-Partner: Not on file  . Emotionally Abused: Not on file  . Physically Abused: Not on file  . Sexually Abused: Not on file    Review of Systems:Review of Systems  Constitutional: Positive for malaise/fatigue. Negative for chills, fever and weight loss.  HENT: Negative for hearing loss and tinnitus.   Eyes: Negative for pain and redness.  Respiratory: Negative for shortness of breath.   Cardiovascular: Negative for chest pain and palpitations.  Gastrointestinal: Positive for blood in stool, melena and nausea. Negative for abdominal pain, constipation, diarrhea, heartburn and vomiting.  Genitourinary: Negative for flank pain and hematuria.  Musculoskeletal: Negative for falls and joint pain.  Skin: Negative for itching and rash.  Neurological: Positive for weakness. Negative for seizures and loss of consciousness.   Endo/Heme/Allergies: Negative for polydipsia. Bruises/bleeds easily.  Psychiatric/Behavioral: Negative for substance abuse. The patient is not nervous/anxious.     Physical Exam: Vital signs: Vitals:   03/01/20 1500 03/01/20 1530  BP: 140/70 101/88  Pulse: 68 74  Resp: (!) 24 12  Temp:    SpO2: 100% 98%     Physical Exam Constitutional:      General: She is not in acute distress.    Appearance: She is obese.  HENT:     Head: Normocephalic and atraumatic.     Nose: Nose normal. No congestion.  Mouth/Throat:     Mouth: Mucous membranes are moist.     Pharynx: Oropharynx is clear.  Eyes:     General: No scleral icterus.    Extraocular Movements: Extraocular movements intact.     Comments: Conjunctival pallor  Cardiovascular:     Rate and Rhythm: Normal rate and regular rhythm.     Pulses: Normal pulses.  Pulmonary:     Effort: Pulmonary effort is normal. No respiratory distress.     Breath sounds: Normal breath sounds.  Abdominal:     General: Bowel sounds are normal. There is no distension.     Palpations: Abdomen is soft. There is no mass.     Tenderness: There is no abdominal tenderness. There is no guarding or rebound.     Hernia: No hernia is present.  Musculoskeletal:        General: No swelling or tenderness.     Cervical back: Normal range of motion and neck supple.  Skin:    General: Skin is warm and dry.  Neurological:     General: No focal deficit present.     Mental Status: She is alert and oriented to person, place, and time.  Psychiatric:        Mood and Affect: Mood normal.        Behavior: Behavior normal.     GI:  Lab Results: Recent Labs    03/01/20 1356  WBC 14.9*  HGB 6.6*  HCT 23.1*  PLT 253   BMET Recent Labs    03/01/20 1356  NA 141  K 3.3*  CL 106  CO2 26  GLUCOSE 112*  BUN 20  CREATININE 0.91  CALCIUM 8.8*   LFT Recent Labs    03/01/20 1356  PROT 6.5  ALBUMIN 3.4*  AST 17  ALT 10  ALKPHOS 58  BILITOT 0.5    PT/INR Recent Labs    03/01/20 1356  LABPROT 13.4  INR 1.1     Studies/Results: No results found.  Impression: Lower GI bleeding: Hematochezia, followed by melena, suggestive of resolving lower GI bleeding.  Most likely due to AVMs, though patient also has history of tubular adenomas. -Hemoglobin 6.6 today, decreased from 9.7 on 02/25/2020 -Normal INR (1.1) -Normal BUN (20) and creatinine (0.91), though BUN elevated from baseline of 11 -History of multiple GI bleeds due to AVMs, including gastric AVMs  Plan: Recommend proceeding with both EGD and colonoscopy tomorrow.  I thoroughly discussed procedures with the patient to include nature, alternatives, benefits, and risks (including but not limited to bleeding, infection, perforation, anesthesia/cardiac and pulmonary complications).  Patient verbalized understanding and gave verbal consent to proceed with EGD and colonoscopy.  Clear liquid diet with Nulytely prep today.  NPO after midnight.  Protonix 40 mg IV twice daily.  Continue to monitor H&H with transfusion as needed to maintain hemoglobin greater than 7.  [facility] will follow.   LOS: 0 days   Salley Slaughter  PA-C 03/01/2020, 3:53 PM  Contact #  [phone]

## 2020-03-01 NOTE — ED Triage Notes (Signed)
Pt's GI doctor called and said she needed to go to ED for blood transfusion d/t low hemoglobin. She has noticed blood in her stool.

## 2020-03-01 NOTE — ED Notes (Signed)
Attempted to give report, unit says that the bed has not be approved and that they did not know a patient was coming.  Asked if they knew when it would be approved and they said they did not know.  Will attempt again in 15 minutes.

## 2020-03-01 NOTE — H&P (Signed)
History and Physical    Peggy House OMV:[id number] DOB: 14-Feb-1961 DOA: 03/01/2020  PCP: [name], MD  Patient coming from: Home  Chief Complaint: GIB  HPI: Peggy House is a 59 y.o. female with medical history significant of hereditary hemorraghic telangiectasia, recurrent GIB, HTN, DM2. Presenting with GIB. Reports that 3 nights ago, she went to bathroom had nothing but blood in toilet. This happened twice that night. The next day her stool changed to dark red and then yesterday black. She had no stomach pain. She did have some nausea, but no vomiting. She called her doc at [facility] and they drew some blood work. She says that they told her that she should go to the ED after they received her results. She denies any alleviating or aggravating factors. She denies any other treatments.   ED Course: She was found to be anemia. 2 units pRBCs were ordered. EDP spoke with [facility]. [facility] was called for admission.    Review of Systems:  Denies CP, palpitations, dyspnea, syncopal episodes. Review of systems is otherwise negative for all not mentioned in HPI.   PMHx Past Medical History:  Diagnosis Date  . Anxiety   . Arthritis    knnes,back  . GERD (gastroesophageal reflux disease)   . Hereditary hemorrhagic telangiectasia ([place])   . History of swelling of feet   . Hyperlipidemia   . Hypertension   . Major depressive disorder, recurrent episode ([place]) 06/05/2015  . Obesity   . Snores   . Type 2 diabetes mellitus with vascular disease ([place]) 02/26/2019    PSHx Past Surgical History:  Procedure Laterality Date  . ABDOMINAL HYSTERECTOMY    . CARPAL TUNNEL RELEASE  05/13/2011   Procedure: CARPAL TUNNEL RELEASE;  Surgeon: Nita Sells, MD;  Location: [place];  Service: Orthopedics;  Laterality: Left;  . COLONOSCOPY WITH PROPOFOL N/A 04/28/2014   Procedure: COLONOSCOPY WITH PROPOFOL;  Surgeon: Cleotis Nipper, MD;  Location[facility]  ENDOSCOPY;  Service: Endoscopy;  Laterality: N/A;  . DG TOES*L*  2/10   rt  . DILATION AND CURETTAGE OF UTERUS    . ENTEROSCOPY N/A 10/17/2017   Procedure: ENTEROSCOPY;  Surgeon: Otis Brace, MD;  Location: [facility] ENDOSCOPY;  Service: Gastroenterology;  Laterality: N/A;  . ESOPHAGOGASTRODUODENOSCOPY N/A 04/10/2014   Procedure: ESOPHAGOGASTRODUODENOSCOPY (EGD);  Surgeon: [name], MD;  Location: [facility] ENDOSCOPY;  Service: Endoscopy;  Laterality: N/A;  . ESOPHAGOGASTRODUODENOSCOPY N/A 05/10/2017   Procedure: ESOPHAGOGASTRODUODENOSCOPY (EGD);  Surgeon: Ronald Lobo, MD;  Location: [facility] ENDOSCOPY;  Service: Endoscopy;  Laterality: N/A;  . ESOPHAGOGASTRODUODENOSCOPY N/A 09/22/2017   Procedure: ESOPHAGOGASTRODUODENOSCOPY (EGD);  Surgeon: Clarene Essex, MD;  Location: [place];  Service: Endoscopy;  Laterality: N/A;  bedside  . ESOPHAGOGASTRODUODENOSCOPY (EGD) WITH PROPOFOL N/A 04/27/2014   Procedure: ESOPHAGOGASTRODUODENOSCOPY (EGD) WITH PROPOFOL;  Surgeon: Cleotis Nipper, MD;  Location: [place];  Service: Endoscopy;  Laterality: N/A;  possible apc  . ESOPHAGOGASTRODUODENOSCOPY (EGD) WITH PROPOFOL N/A 09/30/2017   Procedure: ESOPHAGOGASTRODUODENOSCOPY (EGD) WITH PROPOFOL;  Surgeon: [name], MD;  Location: [place];  Service: Gastroenterology;  Laterality: N/A;  . ESOPHAGOGASTRODUODENOSCOPY (EGD) WITH PROPOFOL N/A 10/01/2017   Procedure: ESOPHAGOGASTRODUODENOSCOPY (EGD) WITH PROPOFOL;  Surgeon: [name], MD;  Location: [place];  Service: Gastroenterology;  Laterality: N/A;  . ESOPHAGOGASTRODUODENOSCOPY (EGD) WITH PROPOFOL N/A 10/08/2017   Procedure: ESOPHAGOGASTRODUODENOSCOPY (EGD) WITH PROPOFOL;  Surgeon: Otis Brace, MD;  Location: [facility] ENDOSCOPY;  Service: Gastroenterology;  Laterality: N/A;  . ESOPHAGOGASTRODUODENOSCOPY (EGD) WITH PROPOFOL N/A  10/17/2017   Procedure: ESOPHAGOGASTRODUODENOSCOPY (EGD) WITH PROPOFOL;  Surgeon: Otis Brace, MD;  Location: [facility]  ENDOSCOPY;  Service: Gastroenterology;  Laterality: N/A;  . ESOPHAGOGASTRODUODENOSCOPY (EGD) WITH PROPOFOL N/A 10/19/2017   Procedure: ESOPHAGOGASTRODUODENOSCOPY (EGD) WITH PROPOFOL;  Surgeon: Otis Brace, MD;  Location: [facility] ENDOSCOPY;  Service: Gastroenterology;  Laterality: N/A;  . ESOPHAGOGASTRODUODENOSCOPY (EGD) WITH PROPOFOL N/A 12/04/2018   Procedure: ESOPHAGOGASTRODUODENOSCOPY (EGD) WITH PROPOFOL;  Surgeon: Wilford Corner, MD;  Location: [facility] ENDOSCOPY;  Service: Endoscopy;  Laterality: N/A;  . GIVENS CAPSULE STUDY N/A 10/02/2017   Procedure: GIVENS CAPSULE STUDY;  Surgeon: [name], MD;  Location: [place];  Service: Gastroenterology;  Laterality: N/A;  . GIVENS CAPSULE STUDY N/A 10/08/2017   Procedure: GIVENS CAPSULE STUDY;  Surgeon: Otis Brace, MD;  Location: [place];  Service: Gastroenterology;  Laterality: N/A;  endoscopic placement of capsule  . HEMOSTASIS CLIP PLACEMENT  12/04/2018   Procedure: HEMOSTASIS CLIP PLACEMENT;  Surgeon: Wilford Corner, MD;  Location: [facility] ENDOSCOPY;  Service: Endoscopy;;  . HOT HEMOSTASIS N/A 04/27/2014   Procedure: HOT HEMOSTASIS (ARGON PLASMA COAGULATION/BICAP);  Surgeon: Cleotis Nipper, MD;  Location: [facility] ENDOSCOPY;  Service: Endoscopy;  Laterality: N/A;  . HOT HEMOSTASIS N/A 09/30/2017   Procedure: HOT HEMOSTASIS (ARGON PLASMA COAGULATION/BICAP);  Surgeon: [name], MD;  Location: [place];  Service: Gastroenterology;  Laterality: N/A;  . HOT HEMOSTASIS N/A 10/01/2017   Procedure: HOT HEMOSTASIS (ARGON PLASMA COAGULATION/BICAP);  Surgeon: [name], MD;  Location: [place];  Service: Gastroenterology;  Laterality: N/A;  . HOT HEMOSTASIS N/A 10/17/2017   Procedure: HOT HEMOSTASIS (ARGON PLASMA COAGULATION/BICAP);  Surgeon: Otis Brace, MD;  Location: [facility] ENDOSCOPY;  Service: Gastroenterology;  Laterality: N/A;  . HOT HEMOSTASIS N/A 10/19/2017   Procedure: HOT HEMOSTASIS (ARGON PLASMA COAGULATION/BICAP);  Surgeon:  Otis Brace, MD;  Location: [facility] ENDOSCOPY;  Service: Gastroenterology;  Laterality: N/A;  . IR IMAGING GUIDED PORT INSERTION  07/08/2018  . L shoulder Surgery  2011  . SUBMUCOSAL INJECTION  09/22/2017   Procedure: SUBMUCOSAL INJECTION;  Surgeon: Clarene Essex, MD;  Location: [facility] ENDOSCOPY;  Service: Endoscopy;;  . SUBMUCOSAL INJECTION  12/04/2018   Procedure: SUBMUCOSAL INJECTION;  Surgeon: Wilford Corner, MD;  Location: [facility] ENDOSCOPY;  Service: Endoscopy;;    SocHx  reports that she has been smoking cigarettes. She has a 15.00 pack-year smoking history. She quit smokeless tobacco use about 40 years ago.  Her smokeless tobacco use included snuff. She reports that she does not drink alcohol and does not use drugs.  Allergies  Allergen Reactions  . Feraheme [Ferumoxytol] Other (See Comments)    Muscle tetany, encephalopathy, fatigue, nausea (reaction to injection)  . Nsaids Other (See Comments)    Stomach bleeding episodes; Tylenol is OK  . Tomato Hives  . Wasp Venom Swelling    FamHx Family History  Problem Relation Age of Onset  . Cancer Mother        Ovarian  . Ovarian cancer Mother   . Diabetes Mother   . Kidney disease Mother   . Bleeding Disorder Mother   . Diabetes type II Sister   . Bleeding Disorder Sister   . Diabetes Sister   . Colon cancer Maternal Grandfather   . Crohn's disease Maternal Grandfather   . Stomach cancer Maternal Grandmother   . Kidney disease Son   . Bleeding Disorder Son   . Dysmenorrhea Neg Hx     Prior to Admission medications   Medication Sig Start Date End Date Taking? Authorizing Provider  acitretin (SORIATANE) 10  MG capsule Take 10 mg by mouth daily. 01/03/20  Yes [provider]  albuterol (PROVENTIL HFA;VENTOLIN HFA) 108 (90 Base) MCG/ACT inhaler Inhale 1-2 puffs into the lungs every 6 (six) hours as needed for wheezing or shortness of breath. 04/03/17  Yes Molt, Bethany, DO  ALPRAZolam (XANAX) 0.5 MG tablet Take 1 tablet (0.5  mg total) by mouth at bedtime as needed for anxiety. 01/31/20  Yes Truitt Merle, MD  Aminocaproic Acid 1000 MG TABS TAKE 1 TABLET(1000 MG) BY MOUTH TWICE DAILY Patient taking differently: Take 1,000 mg by mouth in the morning and at bedtime. TAKE 1 TABLET(1000 MG) BY MOUTH TWICE DAILY 02/11/20  Yes Truitt Merle, MD  amLODipine (NORVASC) 10 MG tablet TAKE 1 TABLET(10 MG) BY MOUTH DAILY Patient taking differently: Take 10 mg by mouth daily. TAKE 1 TABLET(10 MG) BY MOUTH DAILY 01/31/20  Yes Alla Feeling, NP  atorvastatin (LIPITOR) 80 MG tablet TAKE 1 TABLET(80 MG) BY MOUTH DAILY Patient taking differently: Take 80 mg by mouth daily. TAKE 1 TABLET(80 MG) BY MOUTH DAILY 12/16/19  Yes Truitt Merle, MD  cetirizine (ZYRTEC) 10 MG tablet TAKE 1 TABLET(10 MG) BY MOUTH DAILY Patient taking differently: Take 10 mg by mouth daily as needed for allergies.  07/09/19  Yes [name], MD  diphenhydramine-acetaminophen (TYLENOL PM) 25-500 MG TABS tablet Take 1 tablet by mouth at bedtime as needed (sleep).   Yes [provider]  diphenoxylate-atropine (LOMOTIL) 2.5-0.025 MG tablet 1 to 2 PO QID prn diarrhea Patient taking differently: Take 1-2 tablets by mouth 4 (four) times daily as needed for diarrhea or loose stools.  04/15/19  Yes Tanner, Lyndon Code., PA-C  hydrochlorothiazide (HYDRODIURIL) 12.5 MG tablet TAKE 1 TABLET(12.5 MG) BY MOUTH DAILY Patient taking differently: Take 12.5 mg by mouth daily. TAKE 1 TABLET(12.5 MG) BY MOUTH DAILY 11/11/19  Yes Lacinda Axon, MD  lidocaine-prilocaine (EMLA) cream Apply 1 application topically as needed. 07/17/18  Yes Truitt Merle, MD  metFORMIN (GLUCOPHAGE) 500 MG tablet Take 1 tablet (500 mg total) by mouth 2 (two) times daily with a meal. 10/06/19  Yes [name], MD  pantoprazole (PROTONIX) 40 MG tablet Take 1 tablet (40 mg total) by mouth 2 (two) times daily. 03/02/19  Yes [name], MD  Podiatric Products (FLEXITOL HEEL BALM) OINT Apply 1 application  topically as needed (dry skin).  06/10/19  Yes [provider]  potassium chloride (KLOR-CON) 20 MEQ packet Take 20 mEq by mouth 2 (two) times daily. 01/06/20  Yes Alla Feeling, NP  prochlorperazine (COMPAZINE) 10 MG tablet Take 1 tablet (10 mg total) by mouth every 6 (six) hours as needed for nausea or vomiting. 10/14/18  Yes Alla Feeling, NP  sertraline (ZOLOFT) 100 MG tablet TAKE 1 TABLET(100 MG) BY MOUTH DAILY Patient taking differently: Take 100 mg by mouth daily.  02/02/20  Yes Marianna Payment, MD  Accu-Chek Softclix Lancets lancets Use to check blood sugar before breakfast and before dinner while on steroids 09/27/19   [name], MD  cyclobenzaprine (FLEXERIL) 10 MG tablet Take 1 tablet (10 mg total) by mouth 2 (two) times daily as needed for up to 20 doses for muscle spasms. Patient not taking: Reported on 03/01/2020 09/18/19   Lennice Sites, DO  glucose blood (ACCU-CHEK GUIDE) test strip Check blood sugar 2 times per day while on steroids before breakfast and dinner 09/27/19   [name], MD  traZODone (DESYREL) 50 MG tablet TAKE 1 TABLET(50 MG)  BY MOUTH AT BEDTIME Patient not taking: Reported on 03/01/2020 08/16/19   [name], MD    Physical Exam: Vitals:   03/01/20 1415 03/01/20 1430 03/01/20 1500 03/01/20 1530  BP: 131/78 127/78 140/70 101/88  Pulse: 79 68 68 74  Resp: 18 (!) 21 (!) 24 12  Temp:      TempSrc:      SpO2: 100% 99% 100% 98%    General: 59 y.o. female resting in bed in NAD Eyes: PERRL, normal sclera ENMT: Nares patent w/o discharge, orophaynx clear, dentition normal, ears w/o discharge/lesions/ulcers Neck: Supple, trachea midline Cardiovascular: RRR, +S1, S2, no m/g/r, equal pulses throughout Respiratory: CTABL, no w/r/r, normal WOB GI: BS+, NDNT, no masses noted, no organomegaly noted MSK: No e/c/c Skin: No rashes, bruises, ulcerations noted Neuro: A&O x 3, no focal deficits Psyc: Appropriate interaction and affect,  calm/cooperative  Labs on Admission: I have personally reviewed following labs and imaging studies  CBC: Recent Labs  Lab 02/25/20 1313 03/01/20 1356  WBC 12.5* 14.9*  NEUTROABS 10.1* PENDING  HGB 9.7* 6.6*  HCT 32.5* 23.1*  MCV 79.9* 86.5  PLT 267 814   Basic Metabolic Panel: Recent Labs  Lab 02/25/20 1313 03/01/20 1356  NA 141 141  K 3.7 3.3*  CL 106 106  CO2 26 26  GLUCOSE 118* 112*  BUN 11 20  CREATININE 0.82 0.91  CALCIUM 9.2 8.8*   GFR: CrCl cannot be calculated (Unknown ideal weight.). Liver Function Tests: Recent Labs  Lab 02/25/20 1313 03/01/20 1356  AST 13* 17  ALT <6 10  ALKPHOS 80 58  BILITOT 0.4 0.5  PROT 7.5 6.5  ALBUMIN 3.5 3.4*   No results for input(s): LIPASE, AMYLASE in the last 168 hours. No results for input(s): AMMONIA in the last 168 hours. Coagulation Profile: Recent Labs  Lab 03/01/20 1356  INR 1.1   Cardiac Enzymes: No results for input(s): CKTOTAL, CKMB, CKMBINDEX, TROPONINI in the last 168 hours. BNP (last 3 results) No results for input(s): PROBNP in the last 8760 hours. HbA1C: No results for input(s): HGBA1C in the last 72 hours. CBG: No results for input(s): GLUCAP in the last 168 hours. Lipid Profile: No results for input(s): CHOL, HDL, LDLCALC, TRIG, CHOLHDL, LDLDIRECT in the last 72 hours. Thyroid Function Tests: No results for input(s): TSH, T4TOTAL, FREET4, T3FREE, THYROIDAB in the last 72 hours. Anemia Panel: No results for input(s): VITAMINB12, FOLATE, FERRITIN, TIBC, IRON, RETICCTPCT in the last 72 hours. Urine analysis:    Component Value Date/Time   COLORURINE YELLOW 10/06/2017 1933   APPEARANCEUR CLEAR 10/06/2017 1933   LABSPEC 1.016 10/06/2017 1933   PHURINE 5.0 10/06/2017 1933   GLUCOSEU NEGATIVE 10/06/2017 1933   HGBUR NEGATIVE 10/06/2017 1933   BILIRUBINUR NEGATIVE 10/06/2017 1933   KETONESUR NEGATIVE 10/06/2017 1933   PROTEINUR 100 (A) 02/11/2020 1250   UROBILINOGEN 1.0 08/24/2013 2121    NITRITE NEGATIVE 10/06/2017 1933   LEUKOCYTESUR NEGATIVE 10/06/2017 1933    Radiological Exams on Admission: No results found.  Assessment/Plan Recurrent GIB hereditary hemorraghic telangiectasia     - admit to inpatient, telemetry     - [facility] to see, appreciate assistance     - follow H&H, transfuse pRBCs     - IV protonix  HTN     - hold BP for right now d/t soft pressures  HLD     - continue home lipitor  Anxiety     - continue home zoloft  Hypokalemia     -  replace K+; check Mg2+  Leukocytosis     - reactive? No fevers. No urinary, respiratory symptoms     - follow  DM2     - hold home metformin     - SSI, A1c, glucose checks  DVT prophylaxis: SCDs  Code Status: FULL  Family Communication: None at bedside.  Consults called: EDP consulted GI ([facility])  Status is: Inpatient  Remains inpatient appropriate because:Inpatient level of care appropriate due to severity of illness   Dispo: The patient is from: Home              Anticipated d/c is to: Home              Anticipated d/c date is: 2 days              Patient currently is not medically stable to d/c.   Jonnie Finner DO [facility]  If 7PM-7AM, please contact night-coverage [email or web address]  03/01/2020, 4:19 PM

## 2020-03-01 NOTE — ED Provider Notes (Signed)
[facility] DEPT Provider Note   CSN: [id number] Arrival date & time: 03/01/20  1245     History Chief Complaint  Patient presents with  . Abnormal Lab    low Hgb    Peggy House is a 59 y.o. female.  HPI Patient reportedly sent in from gastroenterology for hemoglobin of 6.  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  Recently did have iron infusion.  States that on Sunday with today being Wednesday she did develop bloody stools twice.  States after that became black stools for Monday and Tuesday.  No stool yet today.  States she does feel little lightheaded.  States she saw GI had a blood work drawn and was called today telling her she would need blood because hemoglobin is 6.  No chest pain.  No trouble breathing.    Past Medical History:  Diagnosis Date  . Anxiety   . Arthritis    knnes,back  . GERD (gastroesophageal reflux disease)   . 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 History of swelling of feet   . Hyperlipidemia   . Hypertension   . Major depressive disorder, recurrent episode ([place]) 06/05/2015  . Obesity   . Snores   . 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 Leg pain, bilateral 09/28/2019  . Urge incontinence 09/28/2019  . Pain due to onychomycosis of toenails of both feet 02/26/2019  . 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 Anemia due to GI blood loss 12/07/2018  . Aspiration pneumonitis ([place]) 12/07/2018  . 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 Acute deep vein thrombosis (DVT) of right upper extremity ([place]) 12/07/2018  . Hypokalemia   . Port-A-Cath in place 09/18/2018  . GAD (generalized anxiety disorder) 01/20/2018  . Need for influenza vaccination 01/20/2018  . Upper GI bleed 10/25/2017  . 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 Acute hypoxemic respiratory failure ([place])   . Symptomatic anemia 05/20/2017  . 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 Venous stasis dermatitis of both lower extremities 05/07/2017  . Recurrent epistaxis 11/29/2016  . Iron deficiency anemia 10/29/2016  . Cough 05/13/2016  . Osteoarthritis, multiple sites 06/05/2015  . Insomnia 06/05/2015  . Major depressive disorder, recurrent episode ([place]) 06/05/2015  . Obesity, Class III, BMI 40-49.9 (morbid obesity) ([place]) 04/26/2014  . GI bleed 04/25/2014  . HLD (hyperlipidemia) 04/25/2014  . Leg swelling 04/25/2014  . Essential hypertension   . Acute on chronic blood loss anemia 04/07/2014    Past Surgical History:  Procedure Laterality Date  . ABDOMINAL HYSTERECTOMY    . CARPAL TUNNEL RELEASE  05/13/2011   Procedure: CARPAL TUNNEL RELEASE;  Surgeon: Nita Sells, MD;  Location: [facility];  Service: Orthopedics;  Laterality: Left;  . COLONOSCOPY WITH PROPOFOL N/A 04/28/2014   Procedure: COLONOSCOPY WITH PROPOFOL;  Surgeon: Cleotis Nipper, MD;  Location: [facility] ENDOSCOPY;  Service: Endoscopy;  Laterality: N/A;  . DG TOES*L*  2/10   rt  . DILATION AND CURETTAGE OF UTERUS    . ENTEROSCOPY N/A 10/17/2017   Procedure: ENTEROSCOPY;  Surgeon: Otis Brace, MD;  Location: [facility] ENDOSCOPY;  Service: Gastroenterology;  Laterality: N/A;  . ESOPHAGOGASTRODUODENOSCOPY N/A 04/10/2014   Procedure: ESOPHAGOGASTRODUODENOSCOPY (EGD);  Surgeon: [name], MD;  Location: [facility] ENDOSCOPY;  Service: Endoscopy;  Laterality: N/A;  .  ESOPHAGOGASTRODUODENOSCOPY N/A 05/10/2017   Procedure: ESOPHAGOGASTRODUODENOSCOPY (EGD);  Surgeon: Ronald Lobo, MD;  Location: [facility] ENDOSCOPY;  Service: Endoscopy;  Laterality: N/A;  . ESOPHAGOGASTRODUODENOSCOPY N/A 09/22/2017   Procedure: ESOPHAGOGASTRODUODENOSCOPY (EGD);  Surgeon: Clarene Essex, MD;  Location: [place];  Service: Endoscopy;  Laterality:  N/A;  bedside  . ESOPHAGOGASTRODUODENOSCOPY (EGD) WITH PROPOFOL N/A 04/27/2014   Procedure: ESOPHAGOGASTRODUODENOSCOPY (EGD) WITH PROPOFOL;  Surgeon: Cleotis Nipper, MD;  Location: [place];  Service: Endoscopy;  Laterality: N/A;  possible apc  . ESOPHAGOGASTRODUODENOSCOPY (EGD) WITH PROPOFOL N/A 09/30/2017   Procedure: ESOPHAGOGASTRODUODENOSCOPY (EGD) WITH PROPOFOL;  Surgeon: [name], MD;  Location: [facility];  Service: Gastroenterology;  Laterality: N/A;  . ESOPHAGOGASTRODUODENOSCOPY (EGD) WITH PROPOFOL N/A 10/01/2017   Procedure: ESOPHAGOGASTRODUODENOSCOPY (EGD) WITH PROPOFOL;  Surgeon: [name], MD;  Location: [place];  Service: Gastroenterology;  Laterality: N/A;  . ESOPHAGOGASTRODUODENOSCOPY (EGD) WITH PROPOFOL N/A 10/08/2017   Procedure: ESOPHAGOGASTRODUODENOSCOPY (EGD) WITH PROPOFOL;  Surgeon: Otis Brace, MD;  Location: [facility] ENDOSCOPY;  Service: Gastroenterology;  Laterality: N/A;  . ESOPHAGOGASTRODUODENOSCOPY (EGD) WITH PROPOFOL N/A 10/17/2017   Procedure: ESOPHAGOGASTRODUODENOSCOPY (EGD) WITH PROPOFOL;  Surgeon: Otis Brace, MD;  Location: [facility] ENDOSCOPY;  Service: Gastroenterology;  Laterality: N/A;  . ESOPHAGOGASTRODUODENOSCOPY (EGD) WITH PROPOFOL N/A 10/19/2017   Procedure: ESOPHAGOGASTRODUODENOSCOPY (EGD) WITH PROPOFOL;  Surgeon: Otis Brace, MD;  Location: [facility] ENDOSCOPY;  Service: Gastroenterology;  Laterality: N/A;  . ESOPHAGOGASTRODUODENOSCOPY (EGD) WITH PROPOFOL N/A 12/04/2018   Procedure: ESOPHAGOGASTRODUODENOSCOPY (EGD) WITH PROPOFOL;  Surgeon: [place] Corner, MD;  Location: [facility] ENDOSCOPY;  Service: Endoscopy;  Laterality: N/A;  . GIVENS CAPSULE STUDY N/A 10/02/2017   Procedure: GIVENS CAPSULE STUDY;  Surgeon: [name], MD;  Location: [place];  Service: Gastroenterology;  Laterality: N/A;  . GIVENS CAPSULE STUDY N/A 10/08/2017   Procedure: GIVENS CAPSULE STUDY;  Surgeon: Otis Brace, MD;  Location: [facility];  Service: Gastroenterology;   Laterality: N/A;  endoscopic placement of capsule  . HEMOSTASIS CLIP PLACEMENT  12/04/2018   Procedure: HEMOSTASIS CLIP PLACEMENT;  Surgeon: [place] Corner, MD;  Location: [facility] ENDOSCOPY;  Service: Endoscopy;;  . HOT HEMOSTASIS N/A 04/27/2014   Procedure: HOT HEMOSTASIS (ARGON PLASMA COAGULATION/BICAP);  Surgeon: Cleotis Nipper, MD;  Location: [facility] ENDOSCOPY;  Service: Endoscopy;  Laterality: N/A;  . HOT HEMOSTASIS N/A 09/30/2017   Procedure: HOT HEMOSTASIS (ARGON PLASMA COAGULATION/BICAP);  Surgeon: [name], MD;  Location: [place];  Service: Gastroenterology;  Laterality: N/A;  . HOT HEMOSTASIS N/A 10/01/2017   Procedure: HOT HEMOSTASIS (ARGON PLASMA COAGULATION/BICAP);  Surgeon: [name], MD;  Location: [place];  Service: Gastroenterology;  Laterality: N/A;  . HOT HEMOSTASIS N/A 10/17/2017   Procedure: HOT HEMOSTASIS (ARGON PLASMA COAGULATION/BICAP);  Surgeon: Otis Brace, MD;  Location: [facility] ENDOSCOPY;  Service: Gastroenterology;  Laterality: N/A;  . HOT HEMOSTASIS N/A 10/19/2017   Procedure: HOT HEMOSTASIS (ARGON PLASMA COAGULATION/BICAP);  Surgeon: Otis Brace, MD;  Location: [facility] ENDOSCOPY;  Service: Gastroenterology;  Laterality: N/A;  . IR IMAGING GUIDED PORT INSERTION  07/08/2018  . L shoulder Surgery  2011  . SUBMUCOSAL INJECTION  09/22/2017   Procedure: SUBMUCOSAL INJECTION;  Surgeon: Clarene Essex, MD;  Location: [facility] ENDOSCOPY;  Service: Endoscopy;;  . SUBMUCOSAL INJECTION  12/04/2018   Procedure: SUBMUCOSAL INJECTION;  Surgeon: Wilford Corner, MD;  Location: [facility] ENDOSCOPY;  Service: Endoscopy;;     OB History   No obstetric history on file.     Family History  Problem Relation Age of Onset  . Cancer Mother        Ovarian  . Ovarian  cancer Mother   . Diabetes Mother   . Kidney disease Mother   . Bleeding Disorder Mother   . Diabetes type II Sister   . Bleeding Disorder Sister   . Diabetes Sister   . Colon cancer Maternal Grandfather   . Crohn's disease  Maternal Grandfather   . Stomach cancer Maternal Grandmother   . Kidney disease Son   . Bleeding Disorder Son   . Dysmenorrhea Neg Hx     Social History   Tobacco Use  . Smoking status: Current Every Day Smoker    Packs/day: 0.50    Years: 30.00    Pack years: 15.00    Types: Cigarettes  . Smokeless tobacco: Former Systems developer    Types: Snuff    Quit date: 59  . Tobacco comment: 1/2 PPD  Vaping Use  . Vaping Use: Never used  Substance Use Topics  . Alcohol use: No    Alcohol/week: 0.0 standard drinks  . Drug use: No    Comment: last cocaine-2010    Home Medications Prior to Admission medications   Medication Sig Start Date End Date Taking? Authorizing Provider  sertraline (ZOLOFT) 100 MG tablet TAKE 1 TABLET(100 MG) BY MOUTH DAILY 02/02/20   Marianna Payment, MD  Accu-Chek Softclix Lancets lancets Use to check blood sugar before breakfast and before dinner while on steroids 09/27/19   [name], MD  acitretin (SORIATANE) 10 MG capsule Take 10 mg by mouth daily. 01/03/20   [provider]  albuterol (PROVENTIL HFA;VENTOLIN HFA) 108 (90 Base) MCG/ACT inhaler Inhale 1-2 puffs into the lungs every 6 (six) hours as needed for wheezing or shortness of breath. 04/03/17   Molt, Bethany, DO  ALPRAZolam (XANAX) 0.5 MG tablet Take 1 tablet (0.5 mg total) by mouth at bedtime as needed for anxiety. 01/31/20   Truitt Merle, MD  Aminocaproic Acid 1000 MG TABS TAKE 1 TABLET(1000 MG) BY MOUTH TWICE DAILY 02/11/20   Truitt Merle, MD  amLODipine (NORVASC) 10 MG tablet TAKE 1 TABLET(10 MG) BY MOUTH DAILY 01/31/20   Alla Feeling, NP  atorvastatin (LIPITOR) 80 MG tablet TAKE 1 TABLET(80 MG) BY MOUTH DAILY 12/16/19   Truitt Merle, MD  cetirizine (ZYRTEC) 10 MG tablet TAKE 1 TABLET(10 MG) BY MOUTH DAILY Patient taking differently: Take 10 mg by mouth daily.  07/09/19   [name], MD  cyclobenzaprine (FLEXERIL) 10 MG tablet Take 1 tablet (10 mg total) by mouth 2 (two) times daily as needed  for up to 20 doses for muscle spasms. 09/18/19   Curatolo, Adam, DO  diphenhydramine-acetaminophen (TYLENOL PM) 25-500 MG TABS tablet Take 1 tablet by mouth at bedtime as needed (sleep).    [provider]  diphenoxylate-atropine (LOMOTIL) 2.5-0.025 MG tablet 1 to 2 PO QID prn diarrhea 04/15/19   Tanner, Lyndon Code., PA-C  glucose blood (ACCU-CHEK GUIDE) test strip Check blood sugar 2 times per day while on steroids before breakfast and dinner 09/27/19   [name], MD  hydrochlorothiazide (HYDRODIURIL) 12.5 MG tablet TAKE 1 TABLET(12.5 MG) BY MOUTH DAILY 11/11/19   Lacinda Axon, MD  lidocaine-prilocaine (EMLA) cream Apply 1 application topically as needed. 07/17/18   Truitt Merle, MD  meloxicam (MOBIC) 15 MG tablet Take 15 mg by mouth 3 (three) times daily. 01/25/20   [provider]  metFORMIN (GLUCOPHAGE) 500 MG tablet Take 1 tablet (500 mg total) by mouth 2 (two) times daily with a meal. 10/06/19   [name], MD  pantoprazole (  PROTONIX) 40 MG tablet Take 1 tablet (40 mg total) by mouth 2 (two) times daily. 03/02/19   [name], MD  Podiatric Products (FLEXITOL HEEL BALM) OINT Apply topically. 06/10/19   [provider]  potassium chloride (KLOR-CON) 20 MEQ packet Take 20 mEq by mouth 2 (two) times daily. 01/06/20   Alla Feeling, NP  prochlorperazine (COMPAZINE) 10 MG tablet Take 1 tablet (10 mg total) by mouth every 6 (six) hours as needed for nausea or vomiting. 10/14/18   Alla Feeling, NP  traZODone (DESYREL) 50 MG tablet TAKE 1 TABLET(50 MG) BY MOUTH AT BEDTIME Patient taking differently: Take 50 mg by mouth at bedtime.  08/16/19   [name], MD    Allergies    Feraheme [ferumoxytol], Nsaids, Tomato, and Wasp venom  Review of Systems   Review of Systems  Constitutional: Negative for appetite change.  HENT: Negative for congestion.   Respiratory: Negative for shortness of breath.   Gastrointestinal: Positive for blood in stool.  Negative for abdominal pain.  Genitourinary: Negative for flank pain.  Musculoskeletal: Negative for back pain.  Skin: Negative for rash.  Neurological: Positive for weakness and light-headedness.  Psychiatric/Behavioral: Negative for confusion.    Physical Exam Updated Vital Signs BP 127/78   Pulse 68   Temp 98.2 F (36.8 C) (Oral)   Resp (!) 21   SpO2 99%   Physical Exam Vitals and nursing note reviewed.  HENT:     Head: Normocephalic.  Eyes:     General: No scleral icterus.    Extraocular Movements: Extraocular movements intact.     Pupils: Pupils are equal, round, and reactive to light.  Cardiovascular:     Rate and Rhythm: Regular rhythm.  Pulmonary:     Breath sounds: No stridor. No wheezing or rhonchi.  Abdominal:     Tenderness: There is no abdominal tenderness.  Musculoskeletal:        General: No tenderness.     Cervical back: Neck supple.  Skin:    Capillary Refill: Capillary refill takes less than 2 seconds.     Coloration: Skin is pale.  Neurological:     Mental Status: She is alert and oriented to person, place, and time.     ED Results / Procedures / Treatments   Labs (all labs ordered are listed, but only abnormal results are displayed) Labs Reviewed  COMPREHENSIVE METABOLIC PANEL - Abnormal; Notable for the following components:      Result Value   Potassium 3.3 (*)    Glucose, Bld 112 (*)    Calcium 8.8 (*)    Albumin 3.4 (*)    All other components within normal limits  CBC WITH DIFFERENTIAL/PLATELET - Abnormal; Notable for the following components:   WBC 14.9 (*)    RBC 2.67 (*)    Hemoglobin 6.6 (*)    HCT 23.1 (*)    MCH 24.7 (*)    MCHC 28.6 (*)    RDW 26.2 (*)    All other components within normal limits  PROTIME-INR  TYPE AND SCREEN  PREPARE RBC (CROSSMATCH)    EKG None  Radiology No results found.  Procedures Procedures (including critical care time)  Medications Ordered in ED Medications  0.9 %  sodium chloride  infusion (has no administration in time range)  0.9 %  sodium chloride infusion (has no administration in time range)    ED Course  I have reviewed the triage vital signs and the nursing notes.  Pertinent labs & imaging results that were available during my care of the patient were reviewed by me and considered in my medical decision making (see chart for details).    MDM Rules/Calculators/A&P                         Patient reportedly sent in by [facility] GI for hemoglobin of 6.  However patient not come with the blood work.  Hemoglobin done here is 6.6.  Hemoglobin recently was 9.  Gets Feraheme infusions by hematology for the chronic anemia due to blood loss.  Has hereditary hemorrhagic telangiectasias with chronic GI bleeds.  Had some bleeding Sunday and some dark stools for last 2 days although no bowel movement today.  Discussed with Dr. Michail Sermon.  States that Dr. Cristina Gong will round on the patient today.  Vitals reassuring but states she is been feeling more lightheaded.  Will transfuse 2 units.  Admit to hospitalist.  CRITICAL CARE Performed by: Davonna Belling Total critical care time: 30 minutes Critical care time was exclusive of separately billable procedures and treating other patients. Critical care was necessary to treat or prevent imminent or life-threatening deterioration. Critical care was time spent personally by me on the following activities: development of treatment plan with patient and/or surrogate as well as nursing, discussions with consultants, evaluation of patient's response to treatment, examination of patient, obtaining history from patient or surrogate, ordering and performing treatments and interventions, ordering and review of laboratory studies, ordering and review of radiographic studies, pulse oximetry and re-evaluation of patient's condition.  Final Clinical Impression(s) / ED Diagnoses Final diagnoses:  Symptomatic anemia  Gastrointestinal hemorrhage,  unspecified gastrointestinal hemorrhage type    Rx / DC Orders ED Discharge Orders    None       Davonna Belling, MD 03/01/20 1504

## 2020-03-02 ENCOUNTER — Encounter (HOSPITAL_COMMUNITY)
Admission: EM | Disposition: A | Discharge status: Home Health | Payer: Self-pay | Source: Home / Self Care | Attending: Internal Medicine

## 2020-03-02 ENCOUNTER — Inpatient Hospital Stay (HOSPITAL_COMMUNITY): Payer: Medicaid Other

## 2020-03-02 ENCOUNTER — Encounter (HOSPITAL_COMMUNITY): Payer: Self-pay | Admitting: Internal Medicine

## 2020-03-02 ENCOUNTER — Inpatient Hospital Stay (HOSPITAL_COMMUNITY): Payer: Medicaid Other | Admitting: Certified Registered Nurse Anesthetist

## 2020-03-02 DIAGNOSIS — D649 Anemia, unspecified: Secondary | ICD-10-CM | POA: Diagnosis not present

## 2020-03-02 DIAGNOSIS — K922 Gastrointestinal hemorrhage, unspecified: Secondary | ICD-10-CM

## 2020-03-02 HISTORY — PX: GIVENS CAPSULE STUDY: SHX5432

## 2020-03-02 HISTORY — PX: COLONOSCOPY: SHX5424

## 2020-03-02 HISTORY — PX: POLYPECTOMY: SHX5525

## 2020-03-02 HISTORY — PX: ESOPHAGOGASTRODUODENOSCOPY: SHX5428

## 2020-03-02 HISTORY — PX: HOT HEMOSTASIS: SHX5433

## 2020-03-02 LAB — CBC
HCT: 27.2 % — ABNORMAL LOW (ref 36.0–46.0)
HCT: 28.7 % — ABNORMAL LOW (ref 36.0–46.0)
Hemoglobin: 8.2 g/dL — ABNORMAL LOW (ref 12.0–15.0)
Hemoglobin: 8.6 g/dL — ABNORMAL LOW (ref 12.0–15.0)
MCH: 26.4 pg (ref 26.0–34.0)
MCH: 26.8 pg (ref 26.0–34.0)
MCHC: 30.1 g/dL (ref 30.0–36.0)
MCHC: 30 g/dL (ref 30.0–36.0)
MCV: 87.5 fL (ref 80.0–100.0)
MCV: 89.4 fL (ref 80.0–100.0)
Platelets: 256 10*3/uL (ref 150–400)
Platelets: 266 10*3/uL (ref 150–400)
RBC: 3.11 MIL/uL — ABNORMAL LOW (ref 3.87–5.11)
RBC: 3.21 MIL/uL — ABNORMAL LOW (ref 3.87–5.11)
RDW: 23.1 % — ABNORMAL HIGH (ref 11.5–15.5)
RDW: 23.8 % — ABNORMAL HIGH (ref 11.5–15.5)
WBC: 14.5 10*3/uL — ABNORMAL HIGH (ref 4.0–10.5)
WBC: 12.1 10*3/uL — ABNORMAL HIGH (ref 4.0–10.5)
nRBC: 0 % (ref 0.0–0.2)
nRBC: 0 % (ref 0.0–0.2)

## 2020-03-02 LAB — BPAM RBC
Blood Product Expiration Date: 202111302359
Blood Product Expiration Date: 202112012359
ISSUE DATE / TIME: 202111171922
ISSUE DATE / TIME: 202111172311
Unit Type and Rh: 600
Unit Type and Rh: 600

## 2020-03-02 LAB — COMPREHENSIVE METABOLIC PANEL
ALT: 9 U/L (ref 0–44)
AST: 14 U/L — ABNORMAL LOW (ref 15–41)
Albumin: 3.4 g/dL — ABNORMAL LOW (ref 3.5–5.0)
Alkaline Phosphatase: 58 U/L (ref 38–126)
Anion gap: 5 (ref 5–15)
BUN: 15 mg/dL (ref 6–20)
CO2: 28 mmol/L (ref 22–32)
Calcium: 8.6 mg/dL — ABNORMAL LOW (ref 8.9–10.3)
Chloride: 107 mmol/L (ref 98–111)
Creatinine, Ser: 0.77 mg/dL (ref 0.44–1.00)
GFR, Estimated: >60 mL/min (ref >60)
Glucose, Bld: 103 mg/dL — ABNORMAL HIGH (ref 70–99)
Potassium: 3.3 mmol/L — ABNORMAL LOW (ref 3.5–5.1)
Sodium: 140 mmol/L (ref 135–145)
Total Bilirubin: 1.2 mg/dL (ref 0.3–1.2)
Total Protein: 6.4 g/dL — ABNORMAL LOW (ref 6.5–8.1)

## 2020-03-02 LAB — TYPE AND SCREEN
ABO/RH(D): A NEG
Antibody Screen: NEGATIVE
Unit division: 0
Unit division: 0

## 2020-03-02 LAB — HIV ANTIBODY (ROUTINE TESTING W REFLEX): HIV Screen 4th Generation wRfx: NONREACTIVE

## 2020-03-02 LAB — MAGNESIUM: Magnesium: 1.7 mg/dL (ref 1.7–2.4)

## 2020-03-02 LAB — HEMOGLOBIN A1C
Hgb A1c MFr Bld: 5.2 % (ref 4.8–5.6)
Mean Plasma Glucose: 102.54 mg/dL

## 2020-03-02 LAB — GLUCOSE, CAPILLARY
Glucose-Capillary: 104 mg/dL — ABNORMAL HIGH (ref 70–99)
Glucose-Capillary: 113 mg/dL — ABNORMAL HIGH (ref 70–99)
Glucose-Capillary: 101 mg/dL — ABNORMAL HIGH (ref 70–99)
Glucose-Capillary: 114 mg/dL — ABNORMAL HIGH (ref 70–99)

## 2020-03-02 LAB — PREPARE RBC (CROSSMATCH)

## 2020-03-02 SURGERY — EGD (ESOPHAGOGASTRODUODENOSCOPY)
Anesthesia: Monitor Anesthesia Care

## 2020-03-02 SURGERY — IMAGING PROCEDURE, GI TRACT, INTRALUMINAL, VIA CAPSULE
Anesthesia: LOCAL

## 2020-03-02 MED ORDER — PROPOFOL 1000 MG/100ML IV EMUL
INTRAVENOUS | Status: AC
  Filled 2020-03-02: qty 300

## 2020-03-02 MED ORDER — TRAMADOL HCL 50 MG PO TABS
50.0000 mg | ORAL_TABLET | Freq: Four times a day (QID) | ORAL | Status: DC | PRN
  Administered 2020-03-03: 50 mg via ORAL
  Filled 2020-03-02: qty 1

## 2020-03-02 MED ORDER — MORPHINE SULFATE (PF) 2 MG/ML IV SOLN
1.0000 mg | INTRAVENOUS | Status: DC | PRN
  Administered 2020-03-02: 2 mg via INTRAVENOUS
  Filled 2020-03-02: qty 1

## 2020-03-02 MED ORDER — LACTATED RINGERS IV SOLN: INTRAVENOUS | Status: DC | PRN

## 2020-03-02 MED ORDER — POTASSIUM CHLORIDE 10 MEQ/100ML IV SOLN
10.0000 meq | INTRAVENOUS | Status: AC
  Filled 2020-03-02 (×2): qty 100

## 2020-03-02 MED ORDER — PROPOFOL 10 MG/ML IV BOLUS
INTRAVENOUS | Status: DC | PRN
  Administered 2020-03-02: 20 mg via INTRAVENOUS

## 2020-03-02 MED ORDER — LIDOCAINE 2% (20 MG/ML) 5 ML SYRINGE
INTRAMUSCULAR | Status: DC | PRN
  Administered 2020-03-02: 100 mg via INTRAVENOUS

## 2020-03-02 MED ORDER — SODIUM CHLORIDE 0.9% FLUSH: 10.0000 mL | INTRAVENOUS | Status: DC | PRN

## 2020-03-02 MED ORDER — PROPOFOL 500 MG/50ML IV EMUL
INTRAVENOUS | Status: AC
  Filled 2020-03-02: qty 50

## 2020-03-02 MED ORDER — PROPOFOL 500 MG/50ML IV EMUL
INTRAVENOUS | Status: DC | PRN
  Administered 2020-03-02: 100 ug/kg/min via INTRAVENOUS

## 2020-03-02 MED ORDER — CHLORHEXIDINE GLUCONATE CLOTH 2 % EX PADS
6.0000 | MEDICATED_PAD | Freq: Every day | CUTANEOUS | Status: DC
  Administered 2020-03-03: 6 via TOPICAL

## 2020-03-02 SURGICAL SUPPLY — 1 items: TOWEL COTTON PACK 4EA (MISCELLANEOUS) ×4 IMPLANT

## 2020-03-02 NOTE — Op Note (Signed)
[facility] Patient Name: Peggy House Procedure Date: 03/02/2020 MRN: [id number] Attending MD: Ronald Lobo , MD Date of Birth: 12-Nov-1960 CSN: [id number] Age: 59 Admit Type: Inpatient Procedure:                Colonoscopy Indications:              Last colonoscopy: January 2016, Hematochezia,                            Melena, Acute post hemorrhagic anemia Providers:                Ronald Lobo, MD, Kary Kos RN, RN, Tyna Jaksch Technician, Christell Faith, CRNA Referring MD:              Medicines:                Monitored Anesthesia Care Complications:            No immediate complications. Estimated Blood Loss:     Estimated blood loss was minimal. Procedure:                Pre-Anesthesia Assessment:                           - Prior to the procedure, a History and Physical                            was performed, and patient medications and                            allergies were reviewed. The patient's tolerance of                            previous anesthesia was also reviewed. The risks                            and benefits of the procedure and the sedation                            options and risks were discussed with the patient.                            All questions were answered, and informed consent                            was obtained. Prior Anticoagulants: The patient has                            taken no previous anticoagulant or antiplatelet                            agents. ASA Grade Assessment: III - A patient with  severe systemic disease. After reviewing the risks                            and benefits, the patient was deemed in                            satisfactory condition to undergo the procedure.                           After obtaining informed consent, the colonoscope                            was passed under direct vision. Throughout the                             procedure, the patient's blood pressure, pulse, and                            oxygen saturations were monitored continuously. The                            CF-HQ190L ([id number]) Olympus colonoscope was                            introduced through the anus and advanced to the the                            terminal ileum. The colonoscopy was performed                            without difficulty. The patient tolerated the                            procedure well. The quality of the bowel                            preparation was excellent. Scope In: 11:18:22 AM Scope Out: 11:55:04 AM Scope Withdrawal Time: 0 hours 31 minutes 1 second  Total Procedure Duration: 0 hours 36 minutes 42 seconds  Findings:      The perianal and digital rectal examinations were normal.      THERE WAS NO BLOOD IN THE COLONIC LUMEN.      Two small angioectasias without bleeding were found in the mid ascending       colon and in the cecum. Coagulation for bleeding prevention using argon       plasma at 2 liters/minute and 25 watts was successful. Estimated blood       loss was minimal.      A small polyp was found in the sigmoid colon. The polyp was sessile. The       polyp was removed with a cold snare. Resection and retrieval were       complete. Estimated blood loss was minimal.      Six sessile polyps were found in the recto-sigmoid colon. The polyps       were 4 to 7 mm in size. These polyps were  removed with a cold snare.       Resection and retrieval were complete. Estimated blood loss was minimal.      No other significant abnormalities were identified in a careful       examination of the remainder of the colon.      There is no endoscopic evidence of diverticula or inflammation in the       entire colon.      The terminal ileum appeared normal.      The retroflexed view of the distal rectum and anal verge was normal and       showed no anal or rectal abnormalities. Impression:                - Two non-bleeding colonic angioectasias. Treated                            with argon plasma coagulation (APC).                           - One small polyp in the sigmoid colon, removed                            with a cold snare. Resected and retrieved.                           - Six 4 to 7 mm polyps at the recto-sigmoid colon,                            removed with a cold snare. Resected and retrieved.                           - The examined portion of the ileum was normal.                           - The distal rectum and anal verge are normal on                            retroflexion view.                           - No definite source of blood loss seen on this                            exam. Moderate Sedation:      This patient was sedated with monitored anesthesia care, not moderate       sedation. Recommendation:           - Await pathology results.                           - To visualize the small bowel, perform video                            capsule endoscopy today. Procedure Code(s):        --- Professional ---  77116, 59, Colonoscopy, flexible; with control of                            bleeding, any method                           45385, Colonoscopy, flexible; with removal of                            tumor(s), polyp(s), or other lesion(s) by snare                            technique Diagnosis Code(s):        --- Professional ---                           K55.20, Angiodysplasia of colon without hemorrhage                           K63.5, Polyp of colon                           K92.1, Melena (includes Hematochezia)                           D62, Acute posthemorrhagic anemia CPT copyright 2019 American Medical Association. All rights reserved. The codes documented in this report are preliminary and upon coder review may  be revised to meet current compliance requirements. Ronald Lobo, MD 03/02/2020 12:34:17 PM This report  has been signed electronically. Number of Addenda: 0

## 2020-03-02 NOTE — Transfer of Care (Signed)
Immediate Anesthesia Transfer of Care Note  Patient: Peggy House  Procedure(s) Performed: ESOPHAGOGASTRODUODENOSCOPY (EGD) (N/A ) COLONOSCOPY (N/A ) HOT HEMOSTASIS (ARGON PLASMA COAGULATION/BICAP) (N/A ) POLYPECTOMY  Patient Location: PACU  Anesthesia Type:MAC  Level of Consciousness: awake, alert  and patient cooperative  Airway & Oxygen Therapy: Patient Spontanous Breathing and Patient connected to face mask oxygen  Post-op Assessment: Report given to RN and Post -op Vital signs reviewed and stable  Post vital signs: Reviewed and stable  Last Vitals:  Vitals Value Taken Time  BP    Temp    Pulse 65 03/02/20 1207  Resp 20 03/02/20 1207  SpO2 100 % 03/02/20 1207  Vitals shown include unvalidated device data.  Last Pain:  Vitals:   03/02/20 0959  TempSrc: Oral  PainSc: 8          Complications: No complications documented.

## 2020-03-02 NOTE — Progress Notes (Signed)
Patient's endoscopy and colonoscopy were negative for any blood in the GI tract, nor any likely source of bleeding.  A couple of tiny AVMs were APC'd in the proximal colon.  Several small sessile polyps were removed from the rectosigmoid.  Since no source of bleeding was identified, and since the patient was significantly anemic, we will proceed to a capsule endoscopy study today.  Cleotis Nipper, M.D. Pager [phone] If no answer or after 5 PM call [phone]

## 2020-03-02 NOTE — Op Note (Signed)
[facility] Patient Name: Peggy House Procedure Date: 03/02/2020 MRN: [id number] Attending MD: Ronald Lobo , MD Date of Birth: 09-Feb-1961 CSN: [id number] Age: 59 Admit Type: Inpatient Procedure:                Upper GI endoscopy Indications:              Acute post hemorrhagic anemia, Melena--recent                            Meloxicam use Providers:                Ronald Lobo, MD, Kary Kos RN, RN, Tyna Jaksch Technician, Christell Faith, CRNA Referring MD:              Medicines:                Monitored Anesthesia Care Complications:            No immediate complications. Estimated Blood Loss:     Estimated blood loss: none. Estimated blood loss:                            none. Procedure:                Pre-Anesthesia Assessment:                           - Prior to the procedure, a History and Physical                            was performed, and patient medications and                            allergies were reviewed. The patient's tolerance of                            previous anesthesia was also reviewed. The risks                            and benefits of the procedure and the sedation                            options and risks were discussed with the patient.                            All questions were answered, and informed consent                            was obtained. Prior Anticoagulants: The patient has                            taken no previous anticoagulant or antiplatelet                            agents. ASA Grade Assessment:  III - A patient with                            severe systemic disease. After reviewing the risks                            and benefits, the patient was deemed in                            satisfactory condition to undergo the procedure.                           After obtaining informed consent, the endoscope was                            passed under direct  vision. Throughout the                            procedure, the patient's blood pressure, pulse, and                            oxygen saturations were monitored continuously. The                            GIF-H190 ([id number]) Olympus gastroscope was                            introduced through the mouth, and advanced to the                            second part of duodenum. The upper GI endoscopy was                            accomplished without difficulty. The patient                            tolerated the procedure well. Scope In: Scope Out: Findings:      The larynx was normal.      The examined esophagus was normal.      A small hiatal hernia was present.      The entire examined stomach was normal. No blood or coffee grounds were       present.      The cardia and gastric fundus were normal on retroflexion.      A red spot on the greater curve is felt to be suction artifact since it       was not seen during scope insertion, only after suctioning.      The examined duodenum was normal. No vascular ectasiae were seen. Impression:               - No blood in the stomach or source of bleeding                            seen on this exam.                           -  Normal larynx.                           - Normal esophagus.                           - Small hiatal hernia.                           - Normal stomach.                           - Normal examined duodenum.                           - No specimens collected. Moderate Sedation:      This patient was sedated with monitored anesthesia care, not moderate       sedation. Recommendation:           - Perform a colonoscopy today. Procedure Code(s):        --- Professional ---                           [phone], Esophagogastroduodenoscopy, flexible,                            transoral; diagnostic, including collection of                            specimen(s) by brushing or washing, when performed                             (separate procedure) Diagnosis Code(s):        --- Professional ---                           D62, Acute posthemorrhagic anemia                           K92.1, Melena (includes Hematochezia) CPT copyright 2019 American Medical Association. All rights reserved. The codes documented in this report are preliminary and upon coder review may  be revised to meet current compliance requirements. Ronald Lobo, MD 03/02/2020 12:26:51 PM This report has been signed electronically. Number of Addenda: 0

## 2020-03-02 NOTE — Anesthesia Procedure Notes (Signed)
Procedure Name: MAC Date/Time: 03/02/2020 11:02 AM Performed by: West Pugh, CRNA Pre-anesthesia Checklist: Patient identified, Emergency Drugs available, Suction available, Patient being monitored and Timeout performed Patient Re-evaluated:Patient Re-evaluated prior to induction Oxygen Delivery Method: Simple face mask Preoxygenation: Pre-oxygenation with 100% oxygen Induction Type: IV induction Placement Confirmation: positive ETCO2

## 2020-03-02 NOTE — Anesthesia Preprocedure Evaluation (Signed)
Anesthesia Evaluation  Patient identified by MRN, date of birth, ID band Patient awake    Reviewed: Allergy & Precautions, NPO status , Patient's Chart, lab work & pertinent test results  Airway Mallampati: II       Dental no notable dental hx.    Pulmonary Current Smoker and Patient abstained from smoking.,    Pulmonary exam normal        Cardiovascular hypertension, Pt. on medications Normal cardiovascular exam     Neuro/Psych PSYCHIATRIC DISORDERS Anxiety Depression    GI/Hepatic GERD  Medicated and Controlled,  Endo/Other  diabetes, Type 2, Oral Hypoglycemic AgentsMorbid obesity  Renal/GU      Musculoskeletal   Abdominal (+) + obese,   Peds  Hematology  (+) Blood dyscrasia, anemia ,   Anesthesia Other Findings   Reproductive/Obstetrics                             Anesthesia Physical Anesthesia Plan  ASA: III  Anesthesia Plan: MAC   Post-op Pain Management:    Induction:   PONV Risk Score and Plan: Propofol infusion  Airway Management Planned: Natural Airway, Nasal Cannula and Mask  Additional Equipment: None  Intra-op Plan:   Post-operative Plan:   Informed Consent: I have reviewed the patients History and Physical, chart, labs and discussed the procedure including the risks, benefits and alternatives for the proposed anesthesia with the patient or authorized representative who has indicated his/her understanding and acceptance.       Plan Discussed with: CRNA  Anesthesia Plan Comments:         Anesthesia Quick Evaluation

## 2020-03-02 NOTE — Progress Notes (Signed)
Pt swallowed capsule at 1450 with no problems. Pt educated and verbalized understanding.

## 2020-03-02 NOTE — Progress Notes (Signed)
PROGRESS NOTE    Peggy House  CNO:[id number] DOB: 1961/03/09 DOA: 03/01/2020 PCP: [name], MD    Chief Complaint  Patient presents with  . Abnormal Lab    low Hgb    Brief Narrative:  60 year old lady with prior history of hereditary hemorrhagic telangiectasia, hypertension and diabetes, recurrent GI bleed presents with rectal bleeding since yesterday.  On arrival to ED she was found to be anemic she underwent 2 units of PRBC transfusion and GI consulted.  She is a scheduled for EGD and colonoscopy today.    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 Anemia of blood loss secondary to GI bleed. S/p 2 units of PRBC transfusion and repeat hemoglobin around 8.2. GI consulted for an EGD and colonoscopy later today. Repeat H&H tonight and transfuse to keep hemoglobin greater than 7.     Hypokalemia Replaced    Essential hypertension Blood pressure parameters are optimal.     Type 2 diabetes mellitus Diet controlled Hemoglobin A1c around 5.2. Continue with sliding scale insulin.    Hyperlipidemia Continue with Lipitor.   DVT prophylaxis: SCDs Code Status: Full code Family Communication: None at bedside Disposition:   Status is: Inpatient  Remains inpatient appropriate because:Ongoing diagnostic testing needed not appropriate for outpatient work up, Unsafe d/c plan, IV treatments appropriate due to intensity of illness or inability to take PO and Inpatient level of care appropriate due to severity of illness   Dispo: The patient is from: Home              Anticipated d/c is to: Pending              Anticipated d/c date is: 2 days              Patient currently is not medically stable to d/c.       Consultants:   Gastroenterology  Procedures: EGD Colonoscopy Capsule endoscopy.    Antimicrobials:  None  Subjective: Patient denies any new complaints today  Objective: Vitals:   03/02/20 0959 03/02/20 1210 03/02/20 1211 03/02/20 1220  BP: (!) 170/80 136/67 136/67   Pulse: 69 61 63 65  Resp: 16 16 17 17   Temp: 98.2 F (36.8 C)  97.7 F (36.5 C)   TempSrc: Oral     SpO2: 98% 100% 100% 97%  Weight: 118.4 kg     Height: 5\' 5"  (1.651 m)       Intake/Output Summary (Last 24 hours) at 03/02/2020 1221 Last data filed at 03/02/2020 1207 Gross per 24 hour  Intake 1514.09 ml  Output --  Net 1514.09 ml   Filed Weights   03/02/20 0959  Weight: 118.4 kg    Examination:  General exam: Appears calm and comfortable  Respiratory system: Clear to auscultation. Respiratory effort normal. Cardiovascular system: S1 & S2 heard, RRR. No JVD, murmurs,  No pedal edema. Gastrointestinal system: Abdomen is nondistended, soft and nontender.Normal bowel sounds heard. Central nervous system: Alert and oriented. No focal neurological deficits. Extremities: Symmetric 5 x 5 power. Skin: No rashes, lesions or ulcers Psychiatry:  Mood & affect appropriate.     Data Reviewed: I have personally reviewed following labs and imaging studies  CBC: Recent Labs  Lab 02/25/20 1313 03/01/20 1356 03/02/20 0355  WBC 12.5* 14.9* 14.5*  NEUTROABS 10.1* 11.9*  --   HGB 9.7* 6.6* 8.2*  HCT 32.5* 23.1* 27.2*  MCV 79.9* 86.5  87.5  PLT 267 253 166    Basic Metabolic Panel: Recent Labs  Lab 02/25/20 1313 03/01/20 1356 03/02/20 0355  NA 141 141 140  K 3.7 3.3* 3.3*  CL 106 106 107  CO2 26 26 28   GLUCOSE 118* 112* 103*  BUN 11 20 15   CREATININE 0.82 0.91 0.77  CALCIUM 9.2 8.8* 8.6*  MG  --   --  1.7    GFR: Estimated Creatinine Clearance: 97.5 mL/min (by C-G formula based on SCr of 0.77 mg/dL).  Liver Function Tests: Recent Labs  Lab 02/25/20 1313 03/01/20 1356 03/02/20 0355  AST 13* 17 14*  ALT 6 10 9   ALKPHOS 80 58 58  BILITOT 0.4 0.5 1.2  PROT 7.5 6.5 6.4*  ALBUMIN 3.5 3.4*  3.4*    CBG: Recent Labs  Lab 03/01/20 2130 03/02/20 0822  GLUCAP 85 104*     Recent Results (from the past 240 hour(s))  Respiratory Panel by RT PCR (Flu A&B, Covid) - Nasopharyngeal Swab     Status: None   Collection Time: 03/01/20  3:24 PM   Specimen: Nasopharyngeal Swab  Result Value Ref Range Status   SARS Coronavirus 2 by RT PCR NEGATIVE NEGATIVE Final    Comment: (NOTE) SARS-CoV-2 target nucleic acids are NOT DETECTED.  The SARS-CoV-2 RNA is generally detectable in upper respiratoy specimens during the acute phase of infection. The lowest concentration of SARS-CoV-2 viral copies this assay can detect is 131 copies/mL. A negative result does not preclude SARS-Cov-2 infection and should not be used as the sole basis for treatment or other patient management decisions. A negative result may occur with  improper specimen collection/handling, submission of specimen other than nasopharyngeal swab, presence of viral mutation(s) within the areas targeted by this assay, and inadequate number of viral copies (<131 copies/mL). A negative result must be combined with clinical observations, patient history, and epidemiological information. The expected result is Negative.  Fact Sheet for Patients:  PinkCheek.be  Fact Sheet for Healthcare Providers:  GravelBags.it  This test is no t yet approved or cleared by the [place] FDA and  has been authorized for detection and/or diagnosis of SARS-CoV-2 by FDA under an Emergency Use Authorization (EUA). This EUA will remain  in effect (meaning this test can be used) for the duration of the COVID-19 declaration under Section 564(b)(1) of the Act, 21 U.S.C. section 360bbb-3(b)(1), unless the authorization is terminated or revoked sooner.     Influenza A by PCR NEGATIVE NEGATIVE Final   Influenza B by PCR NEGATIVE NEGATIVE Final    Comment: (NOTE) The Xpert Xpress  SARS-CoV-2/FLU/RSV assay is intended as an aid in  the diagnosis of influenza from Nasopharyngeal swab specimens and  should not be used as a sole basis for treatment. Nasal washings and  aspirates are unacceptable for Xpert Xpress SARS-CoV-2/FLU/RSV  testing.  Fact Sheet for Patients: PinkCheek.be  Fact Sheet for Healthcare Providers: GravelBags.it  This test is not yet approved or cleared by the [place] FDA and  has been authorized for detection and/or diagnosis of SARS-CoV-2 by  FDA under an Emergency Use Authorization (EUA). This EUA will remain  in effect (meaning this test can be used) for the duration of the  Covid-19 declaration under Section 564(b)(1) of the Act, 21  U.S.C. section 360bbb-3(b)(1), unless the authorization is  terminated or revoked. Performed at [facility], [place] [address], [place], [place] [address]          Radiology Studies:  No results found.      Scheduled Meds: . [MAR Hold] aminocaproic acid  1,000 mg Oral BID  . [MAR Hold] atorvastatin  80 mg Oral Daily  . [MAR Hold] Chlorhexidine Gluconate Cloth  6 each Topical Daily  . [MAR Hold] insulin aspart  0-9 Units Subcutaneous TID WC  . [MAR Hold] pantoprazole (PROTONIX) IV  40 mg Intravenous Q12H  . [MAR Hold] potassium chloride  20 mEq Oral BID  . [MAR Hold] sertraline  100 mg Oral Daily   Continuous Infusions: . sodium chloride Stopped (03/01/20 1930)  . [MAR Hold] sodium chloride       LOS: 1 day        Hosie Poisson, MD [facility]   To contact the attending provider between 7A-7P or the covering provider during after hours 7P-7A, please log into the web site [email or web address] and access using universal [place] password for that web site. If you do not have the password, please call the hospital operator.  03/02/2020, 12:21 PM

## 2020-03-02 NOTE — Interval H&P Note (Signed)
History and Physical Interval Note:  03/02/2020 10:54 AM  Peggy House  has presented today for surgery, with the diagnosis of rectal bleeding, anemia, hereditary hemorrhagic telangiectasias.  The various methods of treatment have been discussed with the patient. After consideration of risks, benefits and other options for treatment, the patient has consented to  Procedure(s): ESOPHAGOGASTRODUODENOSCOPY (EGD) (N/A) COLONOSCOPY (N/A) as a surgical intervention.  The patient's history has been reviewed, patient examined, no change in status, stable for surgery.  I have reviewed the patient's chart and labs.  Questions were answered to the patient's satisfaction.     Youlanda Mighty Peggy House

## 2020-03-03 DIAGNOSIS — I10 Essential (primary) hypertension: Secondary | ICD-10-CM | POA: Diagnosis not present

## 2020-03-03 DIAGNOSIS — I780 Hereditary hemorrhagic telangiectasia: Secondary | ICD-10-CM

## 2020-03-03 DIAGNOSIS — D500 Iron deficiency anemia secondary to blood loss (chronic): Secondary | ICD-10-CM

## 2020-03-03 DIAGNOSIS — E876 Hypokalemia: Secondary | ICD-10-CM

## 2020-03-03 DIAGNOSIS — E785 Hyperlipidemia, unspecified: Secondary | ICD-10-CM

## 2020-03-03 DIAGNOSIS — K922 Gastrointestinal hemorrhage, unspecified: Secondary | ICD-10-CM | POA: Diagnosis not present

## 2020-03-03 LAB — BASIC METABOLIC PANEL
Anion gap: 9 (ref 5–15)
BUN: 6 mg/dL (ref 6–20)
CO2: 24 mmol/L (ref 22–32)
Calcium: 8.7 mg/dL — ABNORMAL LOW (ref 8.9–10.3)
Chloride: 108 mmol/L (ref 98–111)
Creatinine, Ser: 0.61 mg/dL (ref 0.44–1.00)
GFR, Estimated: >60 mL/min (ref >60)
Glucose, Bld: 114 mg/dL — ABNORMAL HIGH (ref 70–99)
Potassium: 3.5 mmol/L (ref 3.5–5.1)
Sodium: 141 mmol/L (ref 135–145)

## 2020-03-03 LAB — CBC
HCT: 30 % — ABNORMAL LOW (ref 36.0–46.0)
Hemoglobin: 9 g/dL — ABNORMAL LOW (ref 12.0–15.0)
MCH: 26.6 pg (ref 26.0–34.0)
MCHC: 30 g/dL (ref 30.0–36.0)
MCV: 88.8 fL (ref 80.0–100.0)
Platelets: 259 10*3/uL (ref 150–400)
RBC: 3.38 MIL/uL — ABNORMAL LOW (ref 3.87–5.11)
RDW: 24.2 % — ABNORMAL HIGH (ref 11.5–15.5)
WBC: 12.8 10*3/uL — ABNORMAL HIGH (ref 4.0–10.5)
nRBC: 0.2 % (ref 0.0–0.2)

## 2020-03-03 LAB — GLUCOSE, CAPILLARY
Glucose-Capillary: 100 mg/dL — ABNORMAL HIGH (ref 70–99)
Glucose-Capillary: 159 mg/dL — ABNORMAL HIGH (ref 70–99)

## 2020-03-03 LAB — SURGICAL PATHOLOGY

## 2020-03-03 MED ORDER — HEPARIN SOD (PORK) LOCK FLUSH 100 UNIT/ML IV SOLN
500.0000 [IU] | INTRAVENOUS | Status: AC | PRN
  Administered 2020-03-03: 500 [IU]
  Filled 2020-03-03: qty 5

## 2020-03-03 NOTE — Evaluation (Addendum)
Physical Therapy Evaluation Patient Details Name: Peggy House MRN: [id number] DOB: 1960/05/20 Today's Date: 03/03/2020   History of Present Illness  59 year old lady with prior history of hereditary hemorrhagic telangiectasia, hypertension and diabetes, recurrent GI bleed presents with rectal bleeding ,anemic-post 2 units of PRBC transfusion, S/P EGD and colonoscopy, and capsule endoscopy.  Clinical Impression  The patient received sitting on bed edge. Patient had large amount of incontinence in bed. Patient reports Left knee pain when standing, initially limited knee extension which improved with patient performing exercises. CNA stood and assisted for bath, requiring assistance to stabilize while standing. Patient ambulated with RW x 20'.  Patient reports having an aide 3 hours  X 5 days, son is in and out.   Patient should progress to DC home.  Pt admitted with above diagnosis.  Pt currently with functional limitations due to the deficits listed below (see PT Problem List). Pt will benefit from skilled PT to increase their independence and safety with mobility to allow discharge to the venue listed below.       Follow Up Recommendations Home health PT;Supervision/Assistance - 24 hour    Equipment Recommendations  Rolling walker with 5" wheels (ck with pt again)    Recommendations for Other Services OT consult     Precautions / Restrictions Precautions Precaution Comments: urinary urgency      Mobility  Bed Mobility               General bed mobility comments: sitting on bed edge    Transfers Overall transfer level: Needs assistance Equipment used: Rolling walker (2 wheeled);1 person hand held assist Transfers: Sit to/from Stand Sit to Stand: Min assist         General transfer comment: extra  effort and trials to stand from bed x 2.  Ambulation/Gait Ambulation/Gait assistance: Min assist Gait Distance (Feet): 20 Feet Assistive device: Rolling  walker (2 wheeled) Gait Pattern/deviations: Step-through pattern;Decreased stride length;Trunk flexed     General Gait Details: decreased speed,  Stairs            Wheelchair Mobility    Modified Rankin (Stroke Patients Only)       Balance Overall balance assessment: Needs assistance Sitting-balance support: No upper extremity supported;Feet supported Sitting balance-Leahy Scale: Good     Standing balance support: During functional activity;Single extremity supported Standing balance-Leahy Scale: Fair Standing balance comment: stood to wash up.1 HHA                             Pertinent Vitals/Pain Pain Assessment: Faces Faces Pain Scale: Hurts even more Pain Location: left knee Pain Descriptors / Indicators: Aching;Grimacing;Guarding;Other (Comment) Pain Intervention(s): Monitored during session    Home Living Family/patient expects to be discharged to:: Private residence Living Arrangements: Children Available Help at Discharge: Family;Available PRN/intermittently Type of Home: House Home Access: Stairs to enter Entrance Stairs-Rails: None   Home Layout: One level Home Equipment: Environmental consultant - 2 wheels Additional Comments: pt. states that it has not wheels    Prior Function Level of Independence: Needs assistance   Gait / Transfers Assistance Needed: independnet in home ambulator  ADL's / Homemaking Assistance Needed: has Aide 3 hrs x 5 days        Hand Dominance        Extremity/Trunk Assessment   Upper Extremity Assessment Upper Extremity Assessment: Defer to OT evaluation    Lower Extremity Assessment Lower Extremity Assessment: Generalized weakness;LLE deficits/detail LLE  Deficits / Details: initially lacked full knee extensio, felt like  it " locked up."  patient worked on flex and ext and improved to near full extension    Cervical / Trunk Assessment Cervical / Trunk Assessment: Normal  Communication      Cognition  Arousal/Alertness: Awake/alert Behavior During Therapy: WFL for tasks assessed/performed Overall Cognitive Status: Within Functional Limits for tasks assessed                                        General Comments      Exercises     Assessment/Plan    PT Assessment Patient needs continued PT services  PT Problem List Decreased strength;Decreased knowledge of use of DME;Decreased range of motion;Decreased activity tolerance;Decreased safety awareness;Decreased knowledge of precautions;Decreased balance;Decreased mobility       PT Treatment Interventions DME instruction;Gait training;Functional mobility training;Stair training;Therapeutic activities;Patient/family education;Therapeutic exercise    PT Goals (Current goals can be found in the Care Plan section)  Acute Rehab PT Goals Patient Stated Goal: to go home PT Goal Formulation: With patient Time For Goal Achievement: 03/17/20 Potential to Achieve Goals: Good    Frequency Min 3X/week   Barriers to discharge        Co-evaluation               AM-PAC PT "6 Clicks" Mobility  Outcome Measure Help needed turning from your back to your side while in a flat bed without using bedrails?: None Help needed moving from lying on your back to sitting on the side of a flat bed without using bedrails?: None Help needed moving to and from a bed to a chair (including a wheelchair)?: A Little Help needed standing up from a chair using your arms (e.g., wheelchair or bedside chair)?: A Little Help needed to walk in hospital room?: A Little Help needed climbing 3-5 steps with a railing? : A Lot 6 Click Score: 19    End of Session   Activity Tolerance: Patient tolerated treatment well Patient left: in chair;with call bell/phone within reach;with nursing/sitter in room Nurse Communication: Mobility status PT Visit Diagnosis: Unsteadiness on feet (R26.81);Difficulty in walking, not elsewhere classified (R26.2)     Time: 5670-1410 PT Time Calculation (min) (ACUTE ONLY): 21 min   Charges:   PT Evaluation $PT Eval Low Complexity: [place] PT Acute Rehabilitation Services Pager [phone] Office [phone]   Claretha Cooper 03/03/2020, 10:06 AM

## 2020-03-03 NOTE — Progress Notes (Signed)
Physical Therapy Treatment Patient Details Name: Peggy House MRN: [id number] DOB: 1960/12/15 Today's Date: 03/03/2020    History of Present Illness 59 year old lady with prior history of hereditary hemorrhagic telangiectasia, hypertension and diabetes, recurrent GI bleed presents with rectal bleeding ,anemic-post 2 units of PRBC transfusion, S/P EGD and colonoscopy, and capsule endoscopy.    PT Comments    Patient  Found standing  Up at recliner, noted incontinence of urine. Patient states :" I need to go to the BR.Patient's IV still plugged in. Assisted patient to ambulate to BR, patient held onto IV pole. RN notified  Of another incontinent episode.  Patient does state that she  Has had incontinence PTA but not to this degree, uses pullups at home. Continue PT for mobility..   Follow Up Recommendations  Home health PT;Supervision/Assistance - 24 hour     Equipment Recommendations  Rolling walker with 5" wheels    Recommendations for Other Services OT consult     Precautions / Restrictions Precautions Precautions: Fall Precaution Comments: urinary urgency    Mobility  Bed Mobility               General bed mobility comments: standing at recliner  Transfers Overall transfer level: Needs assistance Equipment used: Rolling walker (2 wheeled);1 person hand held assist Transfers: Sit to/from Stand Sit to Stand: Min assist         General transfer comment: standing up  Ambulation/Gait Ambulation/Gait assistance: Min guard Gait Distance (Feet): 20 Feet Assistive device: IV Pole Gait Pattern/deviations: Decreased stride length;Trunk flexed Gait velocity: decr   General Gait Details: held onto IV pole   Stairs             Wheelchair Mobility    Modified Rankin (Stroke Patients Only)       Balance Overall balance assessment: Needs assistance Sitting-balance support: No upper extremity supported;Feet supported Sitting balance-Leahy  Scale: Good     Standing balance support: During functional activity;Single extremity supported Standing balance-Leahy Scale: Fair Standing balance comment: stood to wash up.1 HHA                            Cognition Arousal/Alertness: Awake/alert Behavior During Therapy: WFL for tasks assessed/performed Overall Cognitive Status: No family/caregiver present to determine baseline cognitive functioning Area of Impairment: Safety/judgement                         Safety/Judgement: Decreased awareness of safety     General Comments: pt. stnding up at recliner stating " I need to go to the BR". has had incontinence  again of urine      Exercises      General Comments        Pertinent Vitals/Pain Pain Assessment: Faces Faces Pain Scale: Hurts little more Pain Location: left knee Pain Descriptors / Indicators: Aching;Grimacing;Guarding;Other (Comment) Pain Intervention(s): Monitored during session    Home Living Family/patient expects to be discharged to:: Private residence Living Arrangements: Children Available Help at Discharge: Family;Available PRN/intermittently Type of Home: House Home Access: Stairs to enter Entrance Stairs-Rails: None Home Layout: One level Home Equipment: Environmental consultant - 2 wheels Additional Comments: pt. states that it has not wheels    Prior Function Level of Independence: Needs assistance  Gait / Transfers Assistance Needed: independent in home ambulator ADL's / Homemaking Assistance Needed: has Aide 3 hrs x 5 days, patient reports able to get herself dressed, aide assists with  IADLs, son typically drives patient for groceries     PT Goals (current goals can now be found in the care plan section) Acute Rehab PT Goals Patient Stated Goal: to go home PT Goal Formulation: With patient Time For Goal Achievement: 03/17/20 Potential to Achieve Goals: Good Progress towards PT goals: Progressing toward goals    Frequency    Min  3X/week      PT Plan Current plan remains appropriate    Co-evaluation              AM-PAC PT "6 Clicks" Mobility   Outcome Measure  Help needed turning from your back to your side while in a flat bed without using bedrails?: None Help needed moving from lying on your back to sitting on the side of a flat bed without using bedrails?: None Help needed moving to and from a bed to a chair (including a wheelchair)?: A Little Help needed standing up from a chair using your arms (e.g., wheelchair or bedside chair)?: A Little Help needed to walk in hospital room?: A Little Help needed climbing 3-5 steps with a railing? : A Lot 6 Click Score: 19    End of Session   Activity Tolerance: Patient tolerated treatment well Patient left:  (in BR with OT) Nurse Communication: Mobility status (incontinence) PT Visit Diagnosis: Unsteadiness on feet (R26.81);Difficulty in walking, not elsewhere classified (R26.2)     Time: 1886-7737 PT Time Calculation (min) (ACUTE ONLY): 10 min  Charges:  $Gait Training: 8-22 mins                    [place] Pager [phone] Office [phone]  Claretha Cooper 03/03/2020, 11:19 AM

## 2020-03-03 NOTE — Progress Notes (Addendum)
Capsule endoscopy is completed; the study will be downloaded and will be read hopefully either today or over the weekend.  The patient is stable and feeling well.  No abd pn, CP or difficulty breathing.  No BM's so far, since cleanout for yesterday's colonoscopy.  RECOMMENDATIONS:  1. Have advanced pt's diet 2  Have ordered cbc for this morning; if hgb stable, ok for dischg.  It is NOT necessary to await capsule endoscopy results before discharging pt, since there was no blood in the intestinal tract during yesterday's procedures so we KNOW she was no longer bleeding.  No new medications needed, beyond what she was taking prior to admission. 3. Have made f/u appt w/ our physician assistant, Salley Slaughter for Dec. 23rd to check hemoccult status and overall status and go over capsule endoscopy results 4. Pt should continue f/u w/ her hematologist, Dr. Burr Medico, for mgt of anemia 5. We will sign off, but please call if questions   Cleotis Nipper, M.D. Pager [phone] If no answer or after 5 PM call [phone]

## 2020-03-03 NOTE — TOC Transition Note (Signed)
Transition of Care [facility]) - CM/SW Discharge Note   Patient Details  Name: Peggy House MRN: [id number] Date of Birth: Jul 06, 1960  Transition of Care [facility]) CM/SW Contact:  Trish Mage, LCSW Phone Number: 03/03/2020, 10:46 AM   Clinical Narrative:   Patient who is discharging today is in need of HH PT, rolling walker.  MD alerted.  Orders seen and appreciated.  [facility] is able to provide [facility] PT, but not until after Thanksgiving.  Patient alerted.  [employer] will deliver walker to room. Patient states she has PCS 3 hours a day, does not need any other DME equipment, has a son that is in the home as well. No further needs identified. TOC sign off.     Final next level of care: [place] Barriers to Discharge: No Barriers Identified   Patient Goals and CMS Choice        Discharge Placement                       Discharge Plan and Services                                     Social Determinants of Health (SDOH) Interventions     Readmission Risk Interventions No flowsheet data found.

## 2020-03-03 NOTE — Progress Notes (Signed)
Occupational Therapy Evaluation  Patient lives with her son and is independent at baseline with BADLs, has caregiver 3hrs 5 days a week for IADLs such as cleaning and son provides transportation assistance. Patient reporting increased difficulty with ADLs due to L knee pain, instructed patient in use of AE reacher, sock aid with return demo and min cues for technique. Patient also reporting increased difficulty getting off her toilet at home, instruct patient how to set up commode over toilet to maximize safety and independence, patient verbalize understanding and agreeable to DME recommendation. Patient overall min G for ambulation/transfers to bathroom, with patient reporting increased urinary incontinence. OT note patient's recliner chair saturated with urine despite multiple pads in chair. RN aware.     03/03/20 1116  OT Visit Information  Last OT Received On 03/03/20  Assistance Needed +1  History of Present Illness 59 year old lady with prior history of hereditary hemorrhagic telangiectasia, hypertension and diabetes, recurrent GI bleed presents with rectal bleeding ,anemic-post 2 units of PRBC transfusion, S/P EGD and colonoscopy, and capsule endoscopy.  Precautions  Precautions Fall  Precaution Comments urinary urgency  Restrictions  Weight Bearing Restrictions No  Home Living  Family/patient expects to be discharged to: Private residence  Living Arrangements Children  Available Help at Discharge Family;Available PRN/intermittently  Type of Home House  Home Access Stairs to enter  Entrance Stairs-Rails None  Home Layout One level  Bathroom Shower/Tub Tub/shower unit  [place] - 2 wheels  Additional Comments pt. states that it has not wheels  Prior Function  Level of Independence Needs assistance  Gait / Transfers Assistance Needed independent in home ambulator  ADL's / [place] has Aide 3 hrs x 5 days, patient  reports able to get herself dressed, aide assists with IADLs, son typically drives patient for groceries  Communication  Communication No difficulties  Pain Assessment  Pain Assessment Faces  Faces Pain Scale 4  Pain Location left knee  Pain Descriptors / Indicators Aching;Grimacing;Guarding  Pain Intervention(s) Monitored during session  Cognition  Arousal/Alertness Awake/alert  Behavior During Therapy WFL for tasks assessed/performed  Overall Cognitive Status Within Functional Limits for tasks assessed  Upper Extremity Assessment  Upper Extremity Assessment Overall WFL for tasks assessed  Lower Extremity Assessment  Lower Extremity Assessment Defer to PT evaluation  ADL  Overall ADL's  Needs assistance/impaired  Grooming Wash/dry face;Wash/dry hands;Supervision/safety;Standing  Upper Body Bathing Supervision/ safety;Standing  Lower Body Bathing Moderate assistance;Sit to/from stand  Lower Body Bathing Details (indicate cue type and reason) patient able to wash peri area and upper legs in standing, increased difficulty with lower legs esp on L due to arthritis. provided patient with LH sponge for home  Upper Body Dressing  Set up;Sitting  Lower Body Dressing Moderate assistance;Sitting/lateral leans  Lower Body Dressing Details (indicate cue type and reason) for L LE dressing due to pain from arthritis. instruct patient in AE reacher + sock aid, patient return demo with min cues for technique. also instruct patient how to use shoe horn  Toilet Transfer Min guard;Ambulation  Toilet Transfer Details (indicate cue type and reason) pt hold onto IV pole to steady, min G assist due to mild limping in L LE   Toileting- Clothing Manipulation and Hygiene Supervision/safety;Sit to/from stand  Toileting - Clothing Manipulation Details (indicate cue type and reason) after voiding  Functional mobility during ADLs Min guard (IV pole)  General ADL Comments patient requiring increased assistance  with self care due to  pain, decreased activity tolerance and balance  Bed Mobility  General bed mobility comments OOB   Transfers  Overall transfer level Needs assistance  Equipment used None (IV pole)  Transfers Sit to/from Stand  Sit to Stand Min guard  General transfer comment min G due to unsteadiness and arthritic L knee  Balance  Overall balance assessment Needs assistance  Sitting-balance support No upper extremity supported;Feet supported  Sitting balance-Leahy Scale Good  Standing balance support During functional activity;Single extremity supported  Standing balance-Leahy Scale Fair  Standing balance comment able to maintain balance during peri care without UE assist  OT - End of Session  Activity Tolerance Patient tolerated treatment well  Patient left in chair;with call bell/phone within reach  Nurse Communication Mobility status  OT Assessment  OT Recommendation/Assessment Patient needs continued OT Services  OT Visit Diagnosis Other abnormalities of gait and mobility (R26.89);Pain  Pain - Right/Left Left  Pain - part of body Knee  OT Problem List Decreased activity tolerance;Impaired balance (sitting and/or standing);Pain;Obesity;Decreased knowledge of use of DME or AE  OT Plan  OT Frequency (ACUTE ONLY) Min 2X/week  OT Treatment/Interventions (ACUTE ONLY) Self-care/ADL training;DME and/or AE instruction;Therapeutic activities;Patient/family education;Balance training  AM-PAC OT "6 Clicks" Daily Activity Outcome Measure (Version 2)  Help from another person eating meals? 4  Help from another person taking care of personal grooming? 3  Help from another person toileting, which includes using toliet, bedpan, or urinal? 3  Help from another person bathing (including washing, rinsing, drying)? 2  Help from another person to put on and taking off regular upper body clothing? 3  Help from another person to put on and taking off regular lower body clothing? 2  6 Click  Score 17  OT Recommendation  Follow Up Recommendations No OT follow up;Supervision - Intermittent  OT Equipment 3 in 1 bedside commode  Individuals Consulted  Consulted and Agree with Results and Recommendations Patient  Acute Rehab OT Goals  Patient Stated Goal to go home  OT Goal Formulation With patient  Time For Goal Achievement 03/17/20  Potential to Achieve Goals Good  OT Time Calculation  OT Start Time (ACUTE ONLY) 1037  OT Stop Time (ACUTE ONLY) 1115  OT Time Calculation (min) 38 min  OT General Charges  $OT Visit 1 Visit  OT Evaluation  $OT Eval Low Complexity 1 Low  OT Treatments  $Self Care/Home Management  23-37 mins  Written Expression  Dominant Hand  (did not specify)   Delbert Phenix OT OT pager: [phone]

## 2020-03-04 NOTE — Discharge Summary (Signed)
Physician Discharge Summary  Peggy House ZSW:[id number] DOB: 03/30/1961 DOA: 03/01/2020  PCP: [name], MD  Admit date: 03/01/2020 Discharge date: 03/03/2020  Admitted From: Home Disposition:  Home   Recommendations for Outpatient Follow-up:  1. Follow up with PCP in 1-2 weeks 2. Please obtain BMP/CBC in one week Please follow up with GI for Capsule endoscopy results.   Discharge Condition: stable. CODE STATUS: FULL CODE.  Diet recommendation: Heart Healthy   Brief/Interim Summary: 59 year old lady with prior history of hereditary hemorrhagic telangiectasia, hypertension and diabetes, recurrent GI bleed presents with rectal bleeding since yesterday.  On arrival to ED she was found to be anemic she underwent 2 units of PRBC transfusion and GI consulted.  She underwent EGD and colonoscopy .  Colonoscopy showed Two non-bleeding colonic angioectasias. Treated with argon plasma coagulation (APC). - One small polyp in the sigmoid colon, removed with a cold snare. Resected and retrieved. - Six 4 to 7 mm polyps at the recto-sigmoid colon, removed with a cold snare. Resected and retrieved. - The examined portion of the ileum was normal. - The distal rectum and anal verge are normal on retroflexion view. - No definite source of blood loss seen on this exam.   EGD Showed  No blood in the stomach or source of bleeding seen on this exam. - Normal larynx. - Normal esophagus. - Normal stomach.    She underwent capsule endoscopy.  No more bowel movements. She was started on regular diet and discharged home.    Discharge Diagnoses:  Active Problems:   HLD (hyperlipidemia)   Essential hypertension   Obesity, Class III, BMI 40-49.9 (morbid obesity) (HCC)   Hereditary hemorrhagic telangiectasia (HCC)   Symptomatic anemia   Hypokalemia   Anemia due to GI blood loss   Type 2 diabetes mellitus with vascular disease (HCC)   GIB (gastrointestinal bleeding)  Recurrent GI  bleed Superimposed on hereditary hemorrhagic telangiectasia. Anemia of blood loss secondary to GI bleed. S/p 2 units of PRBC transfusion and repeat hemoglobin around 8.2 to 9  GI consulted for an EGD and colonoscopy . Results reviewed and she underwent capsule endoscopy. GI recommended to follow up the results of the capsule endoscopy outpatient. As she is not having any recurrent bleeding, her diet was advanced and discharged home.       Hypokalemia Replaced    Essential hypertension Blood pressure parameters are optimal. Resume home meds.      Type 2 diabetes mellitus Diet controlled Hemoglobin A1c around 5.2.     Hyperlipidemia Continue with Lipitor.  Body mass index is 43.43 kg/m. Recommend outpatient follow up with PCP for weight loss and diet control.    Discharge Instructions  Discharge Instructions    Diet - low sodium heart healthy   Complete by: As directed    Discharge instructions   Complete by: As directed    Please follow up with gastroenterology as recommended to follow up the results of the capsule endoscopy.     Allergies as of 03/03/2020      Reactions   Feraheme [ferumoxytol] Other (See Comments)   Muscle tetany, encephalopathy, fatigue, nausea (reaction to injection)   Nsaids Other (See Comments)   Stomach bleeding episodes; Tylenol is OK   Tomato Hives   Wasp Venom Swelling      Medication List    STOP taking these medications   cyclobenzaprine 10 MG tablet Commonly known as: FLEXERIL   diphenhydramine-acetaminophen 25-500 MG Tabs tablet Commonly known as: TYLENOL PM  traZODone 50 MG tablet Commonly known as: DESYREL     TAKE these medications   Accu-Chek Guide test strip Generic drug: glucose blood Check blood sugar 2 times per day while on steroids before breakfast and dinner   Accu-Chek Softclix Lancets lancets Use to check blood sugar before breakfast and before dinner while on steroids   acitretin  10 MG capsule Commonly known as: SORIATANE Take 10 mg by mouth daily.   albuterol 108 (90 Base) MCG/ACT inhaler Commonly known as: VENTOLIN HFA Inhale 1-2 puffs into the lungs every 6 (six) hours as needed for wheezing or shortness of breath.   ALPRAZolam 0.5 MG tablet Commonly known as: XANAX Take 1 tablet (0.5 mg total) by mouth at bedtime as needed for anxiety.   Aminocaproic Acid 1000 MG Tabs TAKE 1 TABLET(1000 MG) BY MOUTH TWICE DAILY What changed:   how much to take  how to take this  when to take this   amLODipine 10 MG tablet Commonly known as: NORVASC TAKE 1 TABLET(10 MG) BY MOUTH DAILY What changed: See the new instructions.   atorvastatin 80 MG tablet Commonly known as: LIPITOR TAKE 1 TABLET(80 MG) BY MOUTH DAILY What changed: See the new instructions.   cetirizine 10 MG tablet Commonly known as: ZYRTEC TAKE 1 TABLET(10 MG) BY MOUTH DAILY What changed: See the new instructions.   diphenoxylate-atropine 2.5-0.025 MG tablet Commonly known as: LOMOTIL 1 to 2 PO QID prn diarrhea What changed:   how much to take  how to take this  when to take this  reasons to take this  additional instructions   Flexitol Heel Balm Oint Apply 1 application topically as needed (dry skin).   hydrochlorothiazide 12.5 MG tablet Commonly known as: HYDRODIURIL TAKE 1 TABLET(12.5 MG) BY MOUTH DAILY What changed: See the new instructions.   lidocaine-prilocaine cream Commonly known as: EMLA Apply 1 application topically as needed.   metFORMIN 500 MG tablet Commonly known as: GLUCOPHAGE Take 1 tablet (500 mg total) by mouth 2 (two) times daily with a meal.   pantoprazole 40 MG tablet Commonly known as: PROTONIX Take 1 tablet (40 mg total) by mouth 2 (two) times daily.   potassium chloride 20 MEQ packet Commonly known as: KLOR-CON Take 20 mEq by mouth 2 (two) times daily.   prochlorperazine 10 MG tablet Commonly known as: COMPAZINE Take 1 tablet (10 mg total)  by mouth every 6 (six) hours as needed for nausea or vomiting.   sertraline 100 MG tablet Commonly known as: ZOLOFT TAKE 1 TABLET(100 MG) BY MOUTH DAILY What changed: See the new instructions.       Follow-up [facility] Follow up.   Why: This is the home health agency that will be providing physical therapy in the home for you.  They are unable to start until after Thanksgiving. Call them on Friday the 26th if you have not heard from them yet. Contact information: [address] [place], [place] [address]  Phone: [phone]             Allergies  Allergen Reactions  . Feraheme [Ferumoxytol] Other (See Comments)    Muscle tetany, encephalopathy, fatigue, nausea (reaction to injection)  . Nsaids Other (See Comments)    Stomach bleeding episodes; Tylenol is OK  . Tomato Hives  . Wasp Venom Swelling    Consultations:  GI    Procedures/Studies: DG Knee Left Port  Result Date: 03/02/2020 CLINICAL DATA:  Chronic left knee pain  EXAM: PORTABLE LEFT KNEE - 1-2 VIEW COMPARISON:  None. FINDINGS: No evidence of fracture, dislocation, or joint effusion. Tricompartmental osteoarthritis is seen with joint space loss and marginal osteophyte formation. This is most notable in the lateral and patellofemoral compartment. Patellar enthesophytes are seen. Scattered vascular calcifications are noted. Soft tissues are unremarkable. IMPRESSION: No acute osseous abnormality. 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 Electronically Signed   By: Prudencio Pair M.D.   On: 03/02/2020 16:38      Subjective: no new complaints.    Discharge Exam: Vitals:   03/03/20 0538 03/03/20 1445  BP: 139/84 104/87  Pulse: 71 71  Resp: 16 19  Temp: 98.6 F (37 C) 98.4 F (36.9 C)  SpO2: 95% 100%   Vitals:   03/02/20 1430 03/02/20 2143 03/03/20 0538 03/03/20 1445  BP:  138/88 139/84 104/87  Pulse:  71 71 71   Resp:  16 16 19   Temp:  99.2 F (37.3 C) 98.6 F (37 C) 98.4 F (36.9 C)  TempSrc:  Oral Oral   SpO2:  96% 95% 100%  Weight: 118.4 kg     Height: 5\' 5"  (1.651 m)       General: Pt is alert, awake, not in acute distress Cardiovascular: RRR, S1/S2 +, no rubs, no gallops Respiratory: CTA bilaterally, no wheezing, no rhonchi Abdominal: Soft, NT, ND, bowel sounds + Extremities: no edema, no cyanosis    The results of significant diagnostics from this hospitalization (including imaging, microbiology, ancillary and laboratory) are listed below for reference.     Microbiology: Recent Results (from the past 240 hour(s))  Respiratory Panel by RT PCR (Flu A&B, Covid) - Nasopharyngeal Swab     Status: None   Collection Time: 03/01/20  3:24 PM   Specimen: Nasopharyngeal Swab  Result Value Ref Range Status   SARS Coronavirus 2 by RT PCR NEGATIVE NEGATIVE Final    Comment: (NOTE) SARS-CoV-2 target nucleic acids are NOT DETECTED.  The SARS-CoV-2 RNA is generally detectable in upper respiratoy specimens during the acute phase of infection. The lowest concentration of SARS-CoV-2 viral copies this assay can detect is 131 copies/mL. A negative result does not preclude SARS-Cov-2 infection and should not be used as the sole basis for treatment or other patient management decisions. A negative result may occur with  improper specimen collection/handling, submission of specimen other than nasopharyngeal swab, presence of viral mutation(s) within the areas targeted by this assay, and inadequate number of viral copies (<131 copies/mL). A negative result must be combined with clinical observations, patient history, and epidemiological information. The expected result is Negative.  Fact Sheet for Patients:  PinkCheek.be  Fact Sheet for Healthcare Providers:  GravelBags.it  This test is no t yet approved or cleared by the [place]  FDA and  has been authorized for detection and/or diagnosis of SARS-CoV-2 by FDA under an Emergency Use Authorization (EUA). This EUA will remain  in effect (meaning this test can be used) for the duration of the COVID-19 declaration under Section 564(b)(1) of the Act, 21 U.S.C. section 360bbb-3(b)(1), unless the authorization is terminated or revoked sooner.     Influenza A by PCR NEGATIVE NEGATIVE Final   Influenza B by PCR NEGATIVE NEGATIVE Final    Comment: (NOTE) The Xpert Xpress SARS-CoV-2/FLU/RSV assay is intended as an aid in  the diagnosis of influenza from Nasopharyngeal swab specimens and  should not be used as a sole basis for treatment. Nasal washings and  aspirates are  unacceptable for Xpert Xpress SARS-CoV-2/FLU/RSV  testing.  Fact Sheet for Patients: PinkCheek.be  Fact Sheet for Healthcare Providers: GravelBags.it  This test is not yet approved or cleared by the [place] FDA and  has been authorized for detection and/or diagnosis of SARS-CoV-2 by  FDA under an Emergency Use Authorization (EUA). This EUA will remain  in effect (meaning this test can be used) for the duration of the  Covid-19 declaration under Section 564(b)(1) of the Act, 21  U.S.C. section 360bbb-3(b)(1), unless the authorization is  terminated or revoked. Performed at [facility], [place] [address], [place], [place] [address]      Labs: BNP (last 3 results) No results for input(s): BNP in the last 8760 hours. Basic Metabolic Panel: Recent Labs  Lab 03/01/20 1356 03/02/20 0355 03/03/20 1020  NA 141 140 141  K 3.3* 3.3* 3.5  CL 106 107 108  CO2 26 28 24   GLUCOSE 112* 103* 114*  BUN 20 15 6   CREATININE 0.91 0.77 0.61  CALCIUM 8.8* 8.6* 8.7*  MG  --  1.7  --    Liver Function Tests: Recent Labs  Lab 03/01/20 1356 03/02/20 0355  AST 17 14*  ALT 10 9  ALKPHOS 58 58  BILITOT 0.5 1.2  PROT 6.5 6.4*   ALBUMIN 3.4* 3.4*   No results for input(s): LIPASE, AMYLASE in the last 168 hours. No results for input(s): AMMONIA in the last 168 hours. CBC: Recent Labs  Lab 03/01/20 1356 03/02/20 0355 03/02/20 1415 03/03/20 1020  WBC 14.9* 14.5* 12.1* 12.8*  NEUTROABS 11.9*  --   --   --   HGB 6.6* 8.2* 8.6* 9.0*  HCT 23.1* 27.2* 28.7* 30.0*  MCV 86.5 87.5 89.4 88.8  PLT 253 256 266 259   Cardiac Enzymes: No results for input(s): CKTOTAL, CKMB, CKMBINDEX, TROPONINI in the last 168 hours. BNP: Invalid input(s): POCBNP CBG: Recent Labs  Lab 03/02/20 1406 03/02/20 1608 03/02/20 2147 03/03/20 0735 03/03/20 1139  GLUCAP 113* 101* 114* 100* 159*   D-Dimer No results for input(s): DDIMER in the last 72 hours. Hgb A1c Recent Labs    03/02/20 0355  HGBA1C 5.2   Lipid Profile No results for input(s): CHOL, HDL, LDLCALC, TRIG, CHOLHDL, LDLDIRECT in the last 72 hours. Thyroid function studies No results for input(s): TSH, T4TOTAL, T3FREE, THYROIDAB in the last 72 hours.  Invalid input(s): FREET3 Anemia work up No results for input(s): VITAMINB12, FOLATE, FERRITIN, TIBC, IRON, RETICCTPCT in the last 72 hours. Urinalysis    Component Value Date/Time   COLORURINE YELLOW 10/06/2017 1933   APPEARANCEUR CLEAR 10/06/2017 1933   LABSPEC 1.016 10/06/2017 1933   PHURINE 5.0 10/06/2017 1933   GLUCOSEU NEGATIVE 10/06/2017 1933   HGBUR NEGATIVE 10/06/2017 [place] NEGATIVE 10/06/2017 [place] 10/06/2017 1933   PROTEINUR 100 (A) 02/11/2020 1250   UROBILINOGEN 1.0 08/24/2013 2121   NITRITE NEGATIVE 10/06/2017 1933   LEUKOCYTESUR NEGATIVE 10/06/2017 1933   Sepsis Labs Invalid input(s): PROCALCITONIN,  WBC,  LACTICIDVEN Microbiology Recent Results (from the past 240 hour(s))  Respiratory Panel by RT PCR (Flu A&B, Covid) - Nasopharyngeal Swab     Status: None   Collection Time: 03/01/20  3:24 PM   Specimen: Nasopharyngeal Swab  Result Value Ref Range Status    SARS Coronavirus 2 by RT PCR NEGATIVE NEGATIVE Final    Comment: (NOTE) SARS-CoV-2 target nucleic acids are NOT DETECTED.  The SARS-CoV-2 RNA is generally detectable in upper respiratoy  specimens during the acute phase of infection. The lowest concentration of SARS-CoV-2 viral copies this assay can detect is 131 copies/mL. A negative result does not preclude SARS-Cov-2 infection and should not be used as the sole basis for treatment or other patient management decisions. A negative result may occur with  improper specimen collection/handling, submission of specimen other than nasopharyngeal swab, presence of viral mutation(s) within the areas targeted by this assay, and inadequate number of viral copies (<131 copies/mL). A negative result must be combined with clinical observations, patient history, and epidemiological information. The expected result is Negative.  Fact Sheet for Patients:  PinkCheek.be  Fact Sheet for Healthcare Providers:  GravelBags.it  This test is no t yet approved or cleared by the [place] FDA and  has been authorized for detection and/or diagnosis of SARS-CoV-2 by FDA under an Emergency Use Authorization (EUA). This EUA will remain  in effect (meaning this test can be used) for the duration of the COVID-19 declaration under Section 564(b)(1) of the Act, 21 U.S.C. section 360bbb-3(b)(1), unless the authorization is terminated or revoked sooner.     Influenza A by PCR NEGATIVE NEGATIVE Final   Influenza B by PCR NEGATIVE NEGATIVE Final    Comment: (NOTE) The Xpert Xpress SARS-CoV-2/FLU/RSV assay is intended as an aid in  the diagnosis of influenza from Nasopharyngeal swab specimens and  should not be used as a sole basis for treatment. Nasal washings and  aspirates are unacceptable for Xpert Xpress SARS-CoV-2/FLU/RSV  testing.  Fact Sheet for  Patients: PinkCheek.be  Fact Sheet for Healthcare Providers: GravelBags.it  This test is not yet approved or cleared by the [place] FDA and  has been authorized for detection and/or diagnosis of SARS-CoV-2 by  FDA under an Emergency Use Authorization (EUA). This EUA will remain  in effect (meaning this test can be used) for the duration of the  Covid-19 declaration under Section 564(b)(1) of the Act, 21  U.S.C. section 360bbb-3(b)(1), unless the authorization is  terminated or revoked. Performed at [facility], [place] [address], [place], [place] [address]      Time coordinating discharge: 31 minutes  SIGNED:   Hosie Poisson, MD  [facility] 03/04/2020, 1:02 PM

## 2020-03-06 ENCOUNTER — Encounter (HOSPITAL_COMMUNITY): Payer: Self-pay | Admitting: Gastroenterology

## 2020-03-06 ENCOUNTER — Telehealth: Payer: Self-pay

## 2020-03-06 DIAGNOSIS — M1990 Unspecified osteoarthritis, unspecified site: Secondary | ICD-10-CM | POA: Diagnosis not present

## 2020-03-06 NOTE — Telephone Encounter (Signed)
Transition Care Management Unsuccessful Follow-up Telephone Call  Date of discharge and from where:  03/03/2020 [facility]   Attempts:  1st Attempt  Reason for unsuccessful TCM follow-up call:  Left voice message

## 2020-03-07 DIAGNOSIS — M1990 Unspecified osteoarthritis, unspecified site: Secondary | ICD-10-CM | POA: Diagnosis not present

## 2020-03-08 ENCOUNTER — Encounter (HOSPITAL_COMMUNITY): Payer: Self-pay | Admitting: Anesthesiology

## 2020-03-08 DIAGNOSIS — M1990 Unspecified osteoarthritis, unspecified site: Secondary | ICD-10-CM | POA: Diagnosis not present

## 2020-03-08 NOTE — Anesthesia Postprocedure Evaluation (Signed)
Anesthesia Post Note  Patient: Peggy House  Procedure(s) Performed: ESOPHAGOGASTRODUODENOSCOPY (EGD) (N/A ) COLONOSCOPY (N/A ) HOT HEMOSTASIS (ARGON PLASMA COAGULATION/BICAP) (N/A ) POLYPECTOMY     Patient location during evaluation: Endoscopy Anesthesia Type: MAC Level of consciousness: awake Pain management: pain level controlled Vital Signs Assessment: post-procedure vital signs reviewed and stable Respiratory status: spontaneous breathing Cardiovascular status: stable Postop Assessment: no apparent nausea or vomiting Anesthetic complications: no   No complications documented.  Last Vitals:  Vitals:   03/03/20 0538 03/03/20 1445  BP: 139/84 104/87  Pulse: 71 71  Resp: 16 19  Temp: 37 C 36.9 C  SpO2: 95% 100%    Last Pain:  Vitals:   03/03/20 0816  TempSrc:   PainSc: 0-No pain                 Huston Foley

## 2020-03-10 ENCOUNTER — Inpatient Hospital Stay: Payer: Medicaid Other

## 2020-03-10 ENCOUNTER — Other Ambulatory Visit: Payer: Medicaid Other

## 2020-03-10 ENCOUNTER — Ambulatory Visit: Payer: Medicaid Other

## 2020-03-10 DIAGNOSIS — M1990 Unspecified osteoarthritis, unspecified site: Secondary | ICD-10-CM | POA: Diagnosis not present

## 2020-03-10 NOTE — Telephone Encounter (Signed)
Transition Care Management Follow-up Telephone Call  Date of discharge and from where: 03/03/20 [facility]  How have you been since you were released from the hospital? Pt states that she is feeling better.   Any questions or concerns? No  Items Reviewed:  Did the pt receive and understand the discharge instructions provided? Yes   Medications obtained and verified? Yes   Other? No   Any new allergies since your discharge? No   Dietary orders reviewed? Yes  Do you have support at home? Yes   Home Care and Equipment/Supplies: Were home health services ordered? Yes If so, what is the name of the agency? [facility] Has the agency set up a time to come to the patient's home? No Were any new equipment or medical supplies ordered?  No What is the name of the medical supply agency?  Were you able to get the supplies/equipment? N/A Do you have any questions related to the use of the equipment or supplies? No  Functional Questionnaire: (I = Independent and D = Dependent) ADLs: I  Bathing/Dressing- I  Meal Prep- I  Eating- I  Maintaining continence- I  Transferring/Ambulation- I  Managing Meds- I   Follow up appointments reviewed:   PCP Hospital f/u appt confirmed? No  - pt stated that she will call on Monday to   [facility] f/u appt confirmed? Yes  Scheduled to see [place] to for flush and infusion.   Are transportation arrangements needed? No   If their condition worsens, is the pt aware to call PCP or go to the Emergency Dept.? Yes  Was the patient provided with contact information for the PCP's office or ED? Yes  Was to pt encouraged to call back with questions or concerns? Yes

## 2020-03-10 NOTE — Progress Notes (Signed)
No bevacizumab on 03/11/20 per MD  T.O. Dr Johnnye Lana, RN/Brita Jurgensen Ronnald Ramp, PharmD

## 2020-03-11 ENCOUNTER — Inpatient Hospital Stay: Payer: Medicaid Other

## 2020-03-13 ENCOUNTER — Other Ambulatory Visit: Payer: Self-pay | Admitting: Internal Medicine

## 2020-03-13 DIAGNOSIS — R059 Cough, unspecified: Secondary | ICD-10-CM

## 2020-03-13 DIAGNOSIS — M1990 Unspecified osteoarthritis, unspecified site: Secondary | ICD-10-CM | POA: Diagnosis not present

## 2020-03-13 DIAGNOSIS — K922 Gastrointestinal hemorrhage, unspecified: Secondary | ICD-10-CM

## 2020-03-14 DIAGNOSIS — M1990 Unspecified osteoarthritis, unspecified site: Secondary | ICD-10-CM | POA: Diagnosis not present

## 2020-03-15 DIAGNOSIS — M1990 Unspecified osteoarthritis, unspecified site: Secondary | ICD-10-CM | POA: Diagnosis not present

## 2020-03-16 ENCOUNTER — Inpatient Hospital Stay: Payer: Medicaid Other

## 2020-03-16 ENCOUNTER — Inpatient Hospital Stay: Payer: Medicaid Other | Attending: Hematology

## 2020-03-16 ENCOUNTER — Other Ambulatory Visit: Payer: Self-pay

## 2020-03-16 DIAGNOSIS — D500 Iron deficiency anemia secondary to blood loss (chronic): Secondary | ICD-10-CM

## 2020-03-16 DIAGNOSIS — Z5112 Encounter for antineoplastic immunotherapy: Secondary | ICD-10-CM | POA: Insufficient documentation

## 2020-03-16 DIAGNOSIS — F17210 Nicotine dependence, cigarettes, uncomplicated: Secondary | ICD-10-CM | POA: Diagnosis not present

## 2020-03-16 DIAGNOSIS — K922 Gastrointestinal hemorrhage, unspecified: Secondary | ICD-10-CM | POA: Diagnosis not present

## 2020-03-16 DIAGNOSIS — Z79899 Other long term (current) drug therapy: Secondary | ICD-10-CM | POA: Insufficient documentation

## 2020-03-16 DIAGNOSIS — M1990 Unspecified osteoarthritis, unspecified site: Secondary | ICD-10-CM | POA: Diagnosis not present

## 2020-03-16 DIAGNOSIS — E876 Hypokalemia: Secondary | ICD-10-CM | POA: Insufficient documentation

## 2020-03-16 DIAGNOSIS — D72829 Elevated white blood cell count, unspecified: Secondary | ICD-10-CM | POA: Diagnosis not present

## 2020-03-16 DIAGNOSIS — Z95828 Presence of other vascular implants and grafts: Secondary | ICD-10-CM

## 2020-03-16 DIAGNOSIS — R808 Other proteinuria: Secondary | ICD-10-CM

## 2020-03-16 DIAGNOSIS — I780 Hereditary hemorrhagic telangiectasia: Secondary | ICD-10-CM

## 2020-03-16 DIAGNOSIS — Z7901 Long term (current) use of anticoagulants: Secondary | ICD-10-CM | POA: Diagnosis not present

## 2020-03-16 LAB — CMP (CANCER CENTER ONLY)
ALT: <6 U/L (ref 0–44)
AST: 12 U/L — ABNORMAL LOW (ref 15–41)
Albumin: 3.2 g/dL — ABNORMAL LOW (ref 3.5–5.0)
Alkaline Phosphatase: 72 U/L (ref 38–126)
Anion gap: 10 (ref 5–15)
BUN: 13 mg/dL (ref 6–20)
CO2: 25 mmol/L (ref 22–32)
Calcium: 9.3 mg/dL (ref 8.9–10.3)
Chloride: 107 mmol/L (ref 98–111)
Creatinine: 0.74 mg/dL (ref 0.44–1.00)
GFR, Estimated: >60 mL/min (ref >60)
Glucose, Bld: 110 mg/dL — ABNORMAL HIGH (ref 70–99)
Potassium: 3.5 mmol/L (ref 3.5–5.1)
Sodium: 142 mmol/L (ref 135–145)
Total Bilirubin: <0.2 mg/dL — ABNORMAL LOW (ref 0.3–1.2)
Total Protein: 6.6 g/dL (ref 6.5–8.1)

## 2020-03-16 LAB — CBC WITH DIFFERENTIAL (CANCER CENTER ONLY)
Abs Immature Granulocytes: 0.04 10*3/uL (ref 0.00–0.07)
Basophils Absolute: 0 10*3/uL (ref 0.0–0.1)
Basophils Relative: 0 %
Eosinophils Absolute: 0.1 10*3/uL (ref 0.0–0.5)
Eosinophils Relative: 1 %
HCT: 25.4 % — ABNORMAL LOW (ref 36.0–46.0)
Hemoglobin: 7.3 g/dL — ABNORMAL LOW (ref 12.0–15.0)
Immature Granulocytes: 0 %
Lymphocytes Relative: 17 %
Lymphs Abs: 2.3 10*3/uL (ref 0.7–4.0)
MCH: 25.2 pg — ABNORMAL LOW (ref 26.0–34.0)
MCHC: 28.7 g/dL — ABNORMAL LOW (ref 30.0–36.0)
MCV: 87.6 fL (ref 80.0–100.0)
Monocytes Absolute: 0.7 10*3/uL (ref 0.1–1.0)
Monocytes Relative: 5 %
Neutro Abs: 10.2 10*3/uL — ABNORMAL HIGH (ref 1.7–7.7)
Neutrophils Relative %: 77 %
Platelet Count: 279 10*3/uL (ref 150–400)
RBC: 2.9 MIL/uL — ABNORMAL LOW (ref 3.87–5.11)
RDW: 23.2 % — ABNORMAL HIGH (ref 11.5–15.5)
WBC Count: 13.3 10*3/uL — ABNORMAL HIGH (ref 4.0–10.5)
nRBC: 0 % (ref 0.0–0.2)

## 2020-03-16 LAB — TOTAL PROTEIN, URINE DIPSTICK: Protein, ur: 300 mg/dL — AB

## 2020-03-16 MED ORDER — HEPARIN SOD (PORK) LOCK FLUSH 100 UNIT/ML IV SOLN
500.0000 [IU] | Freq: Once | INTRAVENOUS | Status: AC
  Administered 2020-03-16: 500 [IU]
  Filled 2020-03-16: qty 5

## 2020-03-16 MED ORDER — SODIUM CHLORIDE 0.9% FLUSH
10.0000 mL | Freq: Once | INTRAVENOUS | Status: AC
  Administered 2020-03-16: 10 mL
  Filled 2020-03-16: qty 10

## 2020-03-17 DIAGNOSIS — M1990 Unspecified osteoarthritis, unspecified site: Secondary | ICD-10-CM | POA: Diagnosis not present

## 2020-03-17 LAB — IRON AND TIBC
Iron: 29 ug/dL — ABNORMAL LOW (ref 41–142)
Saturation Ratios: 11 % — ABNORMAL LOW (ref 21–57)
TIBC: 258 ug/dL (ref 236–444)
UIBC: 228 ug/dL (ref 120–384)

## 2020-03-17 LAB — FERRITIN: Ferritin: 46 ng/mL (ref 11–307)

## 2020-03-20 NOTE — Progress Notes (Signed)
[place]   [phone] [phone]   Clinic Follow up Note   Patient Care Team: [name], MD as PCP - General (Internal Medicine)  Date of Service:  03/23/2020  CHIEF COMPLAINT: F/uof HHT andAnemia   PREVIOUS THERAPY:  -Multiple EGD, small bowel enteroscope, C-scope with APC -Oral and iv amicar were given during hospital stay, no effect -IR embolization ofof the left gastric and short gastric artery branch vessels without evidence of residual flow in the Dieulafoy lesionon 12/10/17 -Avastin on 8/2 and 8/30 at [facility]; starting 12/26/17 continue 5mg /kg q2 weeksuntil 02/20/18, restarted on 04/03/2018 -Tamoxifen started in 10/2017 for HHTstopped 12/10/18 due toDVT  CURRENT THERAPY: -Blood transfusion for severe anemia secondary to GI bleedingas needed -ivferric gluconate every1-2 weeks as neededwith ferritin goal 100-200. Increased to weekly on 07/29/19.Reduced to every 2 weeks on 11/12/19 -RestartedAvastin/Zirabev q2weeks on 04/03/18. Reduced to every 4 weeks starting 09/25/19. Increased to every 2 weeks on 01/06/20. Increased dose to 250mg  on 01/20/20 -Amicar 1g bidstarting4/06/2018. Reduced to only as needed on 12/10/18 due toDVT   INTERVAL HISTORY:  Peggy House is here for a follow up. She presents to the clinic alone.  She was hospitalized for GI bleeidng on March 01, 2020, required blood transfusion, and underwent EGD and colonoscopy.  Had 2u blood 2 days ago, slightly better after BM normal lately, no melena or hematochezia. Moderate fatigue is stable, she is able to function at home.  All other systems were reviewed with the patient and are negative.  MEDICAL HISTORY:  Past Medical History:  Diagnosis Date  . Anxiety   . Arthritis    knnes,back  . GERD (gastroesophageal reflux disease)   . 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 History of swelling of feet   . Hyperlipidemia   .  Hypertension   . Major depressive disorder, recurrent episode ([place]) 06/05/2015  . Obesity   . Snores   . 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 ABDOMINAL HYSTERECTOMY    . CARPAL TUNNEL RELEASE  05/13/2011   Procedure: CARPAL TUNNEL RELEASE;  Surgeon: Nita Sells, MD;  Location: [place];  Service: Orthopedics;  Laterality: Left;  . COLONOSCOPY N/A 03/02/2020   Procedure: COLONOSCOPY;  Surgeon: Ronald Lobo, MD;  Location: [facility] ENDOSCOPY;  Service: Endoscopy;  Laterality: N/A;  . COLONOSCOPY WITH PROPOFOL N/A 04/28/2014   Procedure: COLONOSCOPY WITH PROPOFOL;  Surgeon: Cleotis Nipper, MD;  Location: [facility] ENDOSCOPY;  Service: Endoscopy;  Laterality: N/A;  . DG TOES*L*  2/10   rt  . DILATION AND CURETTAGE OF UTERUS    . ENTEROSCOPY N/A 10/17/2017   Procedure: ENTEROSCOPY;  Surgeon: Otis Brace, MD;  [facility];  Service: Gastroenterology;  Laterality: N/A;  . ESOPHAGOGASTRODUODENOSCOPY N/A 04/10/2014   Procedure: ESOPHAGOGASTRODUODENOSCOPY (EGD);  Surgeon: [name], MD;  Location: [facility] ENDOSCOPY;  Service: Endoscopy;  Laterality: N/A;  . ESOPHAGOGASTRODUODENOSCOPY N/A 05/10/2017   Procedure: ESOPHAGOGASTRODUODENOSCOPY (EGD);  Surgeon: Ronald Lobo, MD;  Location: [facility] ENDOSCOPY;  Service: Endoscopy;  Laterality: N/A;  . ESOPHAGOGASTRODUODENOSCOPY N/A 09/22/2017   Procedure: ESOPHAGOGASTRODUODENOSCOPY (EGD);  Surgeon: Clarene Essex, MD;  Location: [place];  Service: Endoscopy;  Laterality: N/A;  bedside  . ESOPHAGOGASTRODUODENOSCOPY N/A 03/02/2020   Procedure: ESOPHAGOGASTRODUODENOSCOPY (EGD);  Surgeon: Ronald Lobo, MD;  [facility];  Service: Endoscopy;  Laterality: N/A;  . ESOPHAGOGASTRODUODENOSCOPY (EGD) WITH PROPOFOL N/A 04/27/2014  Procedure: ESOPHAGOGASTRODUODENOSCOPY (EGD) WITH PROPOFOL;  Surgeon: Cleotis Nipper,  MD;  Location: [facility] ENDOSCOPY;  Service: Endoscopy;  Laterality: N/A;  possible apc  . ESOPHAGOGASTRODUODENOSCOPY (EGD) WITH PROPOFOL N/A 09/30/2017   Procedure: ESOPHAGOGASTRODUODENOSCOPY (EGD) WITH PROPOFOL;  Surgeon: [name], MD;  Location: [place];  Service: Gastroenterology;  Laterality: N/A;  . ESOPHAGOGASTRODUODENOSCOPY (EGD) WITH PROPOFOL N/A 10/01/2017   Procedure: ESOPHAGOGASTRODUODENOSCOPY (EGD) WITH PROPOFOL;  Surgeon: [name], MD;  Location: [place];  Service: Gastroenterology;  Laterality: N/A;  . ESOPHAGOGASTRODUODENOSCOPY (EGD) WITH PROPOFOL N/A 10/08/2017   Procedure: ESOPHAGOGASTRODUODENOSCOPY (EGD) WITH PROPOFOL;  Surgeon: Otis Brace, MD;  Location: [facility] ENDOSCOPY;  Service: Gastroenterology;  Laterality: N/A;  . ESOPHAGOGASTRODUODENOSCOPY (EGD) WITH PROPOFOL N/A 10/17/2017   Procedure: ESOPHAGOGASTRODUODENOSCOPY (EGD) WITH PROPOFOL;  Surgeon: Otis Brace, MD;  Location: [facility] ENDOSCOPY;  Service: Gastroenterology;  Laterality: N/A;  . ESOPHAGOGASTRODUODENOSCOPY (EGD) WITH PROPOFOL N/A 10/19/2017   Procedure: ESOPHAGOGASTRODUODENOSCOPY (EGD) WITH PROPOFOL;  Surgeon: Otis Brace, MD;  Location: [facility] ENDOSCOPY;  Service: Gastroenterology;  Laterality: N/A;  . ESOPHAGOGASTRODUODENOSCOPY (EGD) WITH PROPOFOL N/A 12/04/2018   Procedure: ESOPHAGOGASTRODUODENOSCOPY (EGD) WITH PROPOFOL;  Surgeon: Wilford Corner, MD;  Location: [facility]OS[facility];  Service: Endoscopy;  Laterality: N/A;  . GIVENS CAPSULE STUDY N/A 10/02/2017   Procedure: GIVENS CAPSULE STUDY;  Surgeon: [name], MD;  Location: [place];  Service: Gastroenterology;  Laterality: N/A;  . GIVENS CAPSULE STUDY N/A 10/08/2017   Procedure: GIVENS CAPSULE STUDY;  Surgeon: Otis Brace, MD;  Location: [place];  Service: Gastroenterology;  Laterality: N/A;  endoscopic placement of capsule  . GIVENS CAPSULE STUDY N/A 03/02/2020   Procedure: GIVENS CAPSULE STUDY;  Surgeon: Ronald Lobo, MD;  Location:  [facility];  Service: Endoscopy;  Laterality: N/A;  . HEMOSTASIS CLIP PLACEMENT  12/04/2018   Procedure: HEMOSTASIS CLIP PLACEMENT;  Surgeon: Wilford Corner, MD;  Location: [facility];  Service: Endoscopy;;  . HOT HEMOSTASIS N/A 04/27/2014   Procedure: HOT HEMOSTASIS (ARGON PLASMA COAGULATION/BICAP);  Surgeon: Cleotis Nipper, MD;  Location: [facility] ENDOSCOPY;  Service: Endoscopy;  Laterality: N/A;  . HOT HEMOSTASIS N/A 09/30/2017   Procedure: HOT HEMOSTASIS (ARGON PLASMA COAGULATION/BICAP);  Surgeon: [name], MD;  Location: [place];  Service: Gastroenterology;  Laterality: N/A;  . HOT HEMOSTASIS N/A 10/01/2017   Procedure: HOT HEMOSTASIS (ARGON PLASMA COAGULATION/BICAP);  Surgeon: [name], MD;  Location: [place];  Service: Gastroenterology;  Laterality: N/A;  . HOT HEMOSTASIS N/A 10/17/2017   Procedure: HOT HEMOSTASIS (ARGON PLASMA COAGULATION/BICAP);  Surgeon: Otis Brace, MD;  Location: [facility] ENDOSCOPY;  Service: Gastroenterology;  Laterality: N/A;  . HOT HEMOSTASIS N/A 10/19/2017   Procedure: HOT HEMOSTASIS (ARGON PLASMA COAGULATION/BICAP);  Surgeon: Otis Brace, MD;  Location: [facility] ENDOSCOPY;  Service: Gastroenterology;  Laterality: N/A;  . HOT HEMOSTASIS N/A 03/02/2020   Procedure: HOT HEMOSTASIS (ARGON PLASMA COAGULATION/BICAP);  Surgeon: Ronald Lobo, MD;  [facility];  Service: Endoscopy;  Laterality: N/A;  . IR IMAGING GUIDED PORT INSERTION  07/08/2018  . L shoulder Surgery  2011  . POLYPECTOMY  03/02/2020   Procedure: POLYPECTOMY;  Surgeon: Ronald Lobo, MD;  Location: [facility];  Service: Endoscopy;;  . SUBMUCOSAL INJECTION  09/22/2017   Procedure: SUBMUCOSAL INJECTION;  Surgeon: Clarene Essex, MD;  Location: [place];  Service: Endoscopy;;  . SUBMUCOSAL INJECTION  12/04/2018   Procedure: SUBMUCOSAL INJECTION;  Surgeon: Wilford Corner, MD;  Location: [facility];  Service: Endoscopy;;    I have reviewed the social history and family  history with the patient and they are unchanged from previous  note.  ALLERGIES:  is allergic to feraheme [ferumoxytol], nsaids, tomato, and wasp venom.  MEDICATIONS:  Current Outpatient Medications  Medication Sig Dispense Refill  . Accu-Chek Softclix Lancets lancets Use to check blood sugar before breakfast and before dinner while on steroids 100 each 1  . acitretin (SORIATANE) 10 MG capsule Take 10 mg by mouth daily.    [name] albuterol (PROVENTIL HFA;VENTOLIN HFA) 108 (90 Base) MCG/ACT inhaler Inhale 1-2 puffs into the lungs every 6 (six) hours as needed for wheezing or shortness of breath. 1 Inhaler 3  . ALPRAZolam (XANAX) 0.5 MG tablet Take 1 tablet (0.5 mg total) by mouth at bedtime as needed for anxiety. 30 tablet 0  . Aminocaproic Acid 1000 MG TABS TAKE 1 TABLET(1000 MG) BY MOUTH TWICE DAILY (Patient taking differently: Take 1,000 mg by mouth in the morning and at bedtime. TAKE 1 TABLET(1000 MG) BY MOUTH TWICE DAILY) 60 tablet 2  . amLODipine (NORVASC) 10 MG tablet TAKE 1 TABLET(10 MG) BY MOUTH DAILY (Patient taking differently: Take 10 mg by mouth daily. TAKE 1 TABLET(10 MG) BY MOUTH DAILY) 90 tablet 0  . atorvastatin (LIPITOR) 80 MG tablet TAKE 1 TABLET(80 MG) BY MOUTH DAILY (Patient taking differently: Take 80 mg by mouth daily. TAKE 1 TABLET(80 MG) BY MOUTH DAILY) 30 tablet 3  . cetirizine (ZYRTEC) 10 MG tablet TAKE 1 TABLET(10 MG) BY MOUTH DAILY 90 tablet 1  . diphenoxylate-atropine (LOMOTIL) 2.5-0.025 MG tablet 1 to 2 PO QID prn diarrhea (Patient taking differently: Take 1-2 tablets by mouth 4 (four) times daily as needed for diarrhea or loose stools. ) 30 tablet 1  . glucose blood (ACCU-CHEK GUIDE) test strip Check blood sugar 2 times per day while on steroids before breakfast and dinner 50 each 3  . hydrochlorothiazide (HYDRODIURIL) 12.5 MG tablet TAKE 1 TABLET(12.5 MG) BY MOUTH DAILY (Patient taking differently: Take 12.5 mg by mouth daily. TAKE 1 TABLET(12.5 MG) BY MOUTH DAILY) 90  tablet 1  . lidocaine-prilocaine (EMLA) cream Apply 1 application topically as needed. 30 g 0  . metFORMIN (GLUCOPHAGE) 500 MG tablet Take 1 tablet (500 mg total) by mouth 2 (two) times daily with a meal. 180 tablet 3  . pantoprazole (PROTONIX) 40 MG tablet TAKE 1 TABLET(40 MG) BY MOUTH TWICE DAILY 60 tablet 5  . Podiatric Products (FLEXITOL HEEL BALM) OINT Apply 1 application topically as needed (dry skin).     . potassium chloride (KLOR-CON) 20 MEQ packet Take 20 mEq by mouth 2 (two) times daily. 30 packet 1  . prochlorperazine (COMPAZINE) 10 MG tablet Take 1 tablet (10 mg total) by mouth every 6 (six) hours as needed for nausea or vomiting. 30 tablet 1  . sertraline (ZOLOFT) 100 MG tablet TAKE 1 TABLET(100 MG) BY MOUTH DAILY (Patient taking differently: Take 100 mg by mouth daily. ) 90 tablet 1   No current facility-administered medications for this visit.    PHYSICAL EXAMINATION: ECOG PERFORMANCE STATUS: 2 - Symptomatic, <50% confined to bed  Vitals:   03/23/20 1006  BP: 132/65  Pulse: 84  Resp: 18  Temp: 98 F (36.7 C)  SpO2: 100%   Filed Weights   03/23/20 1006  Weight: 263 lb 12.8 oz (119.7 kg)    GENERAL:alert, no distress and comfortable SKIN: skin color, texture, turgor are normal, no rashes or significant lesions EYES: normal, Conjunctiva are pink and non-injected, sclera clear NECK: supple, thyroid normal size, non-tender, without nodularity LYMPH:  no palpable lymphadenopathy in the cervical, axillary  LUNGS:  clear to auscultation and percussion with normal breathing effort HEART: regular rate & rhythm and no murmurs and no lower extremity edema ABDOMEN:abdomen soft, non-tender and normal bowel sounds Musculoskeletal:no cyanosis of digits and no clubbing  NEURO: alert & oriented x 3 with fluent speech, no focal motor/sensory deficits  LABORATORY DATA:  I have reviewed the data as listed CBC Latest Ref Rng & Units 03/23/2020 03/21/2020 03/16/2020  WBC 4.0 - 10.5  K/uL 15.2(H) 12.1(H) 13.3(H)  Hemoglobin 12.0 - 15.0 g/dL 7.0(L) 6.7(LL) 7.3(L)  Hematocrit 36.0 - 46.0 % 23.2(L) 22.6(L) 25.4(L)  Platelets 150 - 400 K/uL 296 276 279     CMP Latest Ref Rng & Units 03/23/2020 03/21/2020 03/16/2020  Glucose 70 - 99 mg/dL 148(H) 118(H) 110(H)  BUN 6 - 20 mg/dL 12 8 13   Creatinine 0.44 - 1.00 mg/dL 0.84 0.81 0.74  Sodium 135 - 145 mmol/L 144 142 142  Potassium 3.5 - 5.1 mmol/L 3.2(L) 3.3(L) 3.5  Chloride 98 - 111 mmol/L 108 107 107  CO2 22 - 32 mmol/L 22 25 25   Calcium 8.9 - 10.3 mg/dL 9.1 8.9 9.3  Total Protein 6.5 - 8.1 g/dL 6.7 6.9 6.6  Total Bilirubin 0.3 - 1.2 mg/dL 0.2(L) 0.2(L) <0.2(L)  Alkaline Phos 38 - 126 U/L 70 75 72  AST 15 - 41 U/L 13(L) 20 12(L)  ALT 0 - 44 U/L 7 10 <6      RADIOGRAPHIC STUDIES: I have personally reviewed the radiological images as listed and agreed with the findings in the report. No results found.   ASSESSMENT & PLAN:  Peggy House is a 59 y.o. female with    1. 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 Extensive workup found the pt to have HHT based on her personal and familyhistoryand recurrentAVM GI bleedings. -Sheis s/pIR embolization of the left Gastric and short gastric artery branchvesselswithout evidence of residual flow in the Dieulafoy lesionon 12/10/2017. -she has been on bevacizumab every 2-4 week, treatment has been intermittently held due to large proteinuria.  She has been on IV Ferric Gluconate every 1-2 weeks (unless Ferritin >200)and blood transfusions as needed.We repeatedly discussed consistency in treatments to manage her blood counts, she voices good understanding.  -Continue Ferric Gluconate 250mg  weekly unless Ferritin >200 and Beva every 2 weeks if urine protein <300.  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 She is also on blood transfusionsas needed.Last Blood transfusion was 03/21/2020. Continue prenatal vitamin.  -Hemoglobin 7.0 today, will give 1 unit blood  3. Reactive Leukocytosis  -She has mild leukocytosis with dominant neutrophils, likely reactive. Her prior thrombocytosis resolved.  -Mostly stable.   4. 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 She required cauterization for stomach for severe Gi bleeding in 11/2018 -Continue to follow up with her GI, Dr. Cristina Gong -I started heron Amicar 07/17/18. Given DVT she will only take Amicar for severe epistaxis -Her GI bleeding has resolved currently.But  has been havingoccasionalmildepistaxis.   5.History of right Arm DVT -She had right arm DVT that extended to her right clavicleon 12/07/2018.  -She is not a candidate for anticoagulation due to severe GI bleeding. We stopped Tamoxifen, she I son low dose Amicar for epistaxis. Will maintain Port flushes.  6. Smoking cessation -She was smoking 10 cigarettes a day on average before recent hospitalization -I strongly encouraged her to stop smoking completely, she will try.  7. Hypokalemia -Onpotassium chloride 20 mEq daily, continue. Willmonitor  9. HTN, uncontrolled, Recently diagnosed DM -Continue medications. Will monitor on Avastin.  -Continue to f/u with PCP   10. Anxiety  -She has been stressed with family health issues (her husband's passing summer  2021 and son's DM on transplant list). She has been more anxious and started having panic attacks -She is currently on Xanax at 0.5mg  dose to use only as needed. F/u with SW as needed for counseling.   11. B/l LE Edema  -Stable. Continue compression socks    PLAN: -Proceed with Ferric Gluconate 125mg  today (not 250mg  due to time restrain) and Zirabev -1u blood today  -Lab and IV iron ferric gluconate 250mg  on 12/13, then in 1, 2, 3 and 4 weeks  Bevacizumab in 2 and 4 weeks  F/u in 4 weeks    No problem-specific Assessment & Plan notes found for this encounter.   No orders of the defined types were placed in this encounter.  All questions were answered. The patient knows to call the clinic with any problems, questions or concerns. No barriers to learning was detected. The total time spent in the appointment was 30 minutes.     Truitt Merle, MD 03/23/2020   I, Joslyn Devon, am acting as scribe for Truitt Merle, MD.   I have reviewed the above documentation for accuracy and completeness, and I agree with the above.

## 2020-03-21 ENCOUNTER — Telehealth: Payer: Self-pay

## 2020-03-21 ENCOUNTER — Inpatient Hospital Stay: Payer: Medicaid Other

## 2020-03-21 ENCOUNTER — Other Ambulatory Visit: Payer: Self-pay

## 2020-03-21 DIAGNOSIS — D649 Anemia, unspecified: Secondary | ICD-10-CM

## 2020-03-21 DIAGNOSIS — I780 Hereditary hemorrhagic telangiectasia: Secondary | ICD-10-CM

## 2020-03-21 DIAGNOSIS — D500 Iron deficiency anemia secondary to blood loss (chronic): Secondary | ICD-10-CM

## 2020-03-21 DIAGNOSIS — Z5112 Encounter for antineoplastic immunotherapy: Secondary | ICD-10-CM | POA: Diagnosis not present

## 2020-03-21 LAB — CMP (CANCER CENTER ONLY)
ALT: 10 U/L (ref 0–44)
AST: 20 U/L (ref 15–41)
Albumin: 3.2 g/dL — ABNORMAL LOW (ref 3.5–5.0)
Alkaline Phosphatase: 75 U/L (ref 38–126)
Anion gap: 10 (ref 5–15)
BUN: 8 mg/dL (ref 6–20)
CO2: 25 mmol/L (ref 22–32)
Calcium: 8.9 mg/dL (ref 8.9–10.3)
Chloride: 107 mmol/L (ref 98–111)
Creatinine: 0.81 mg/dL (ref 0.44–1.00)
GFR, Estimated: >60 mL/min (ref >60)
Glucose, Bld: 118 mg/dL — ABNORMAL HIGH (ref 70–99)
Potassium: 3.3 mmol/L — ABNORMAL LOW (ref 3.5–5.1)
Sodium: 142 mmol/L (ref 135–145)
Total Bilirubin: 0.2 mg/dL — ABNORMAL LOW (ref 0.3–1.2)
Total Protein: 6.9 g/dL (ref 6.5–8.1)

## 2020-03-21 LAB — PREPARE RBC (CROSSMATCH)

## 2020-03-21 LAB — CBC WITH DIFFERENTIAL (CANCER CENTER ONLY)
Abs Immature Granulocytes: 0.06 10*3/uL (ref 0.00–0.07)
Basophils Absolute: 0.1 10*3/uL (ref 0.0–0.1)
Basophils Relative: 0 %
Eosinophils Absolute: 0.2 10*3/uL (ref 0.0–0.5)
Eosinophils Relative: 1 %
HCT: 22.6 % — ABNORMAL LOW (ref 36.0–46.0)
Hemoglobin: 6.7 g/dL — CL (ref 12.0–15.0)
Immature Granulocytes: 1 %
Lymphocytes Relative: 14 %
Lymphs Abs: 1.7 10*3/uL (ref 0.7–4.0)
MCH: 25.3 pg — ABNORMAL LOW (ref 26.0–34.0)
MCHC: 29.6 g/dL — ABNORMAL LOW (ref 30.0–36.0)
MCV: 85.3 fL (ref 80.0–100.0)
Monocytes Absolute: 0.6 10*3/uL (ref 0.1–1.0)
Monocytes Relative: 5 %
Neutro Abs: 9.5 10*3/uL — ABNORMAL HIGH (ref 1.7–7.7)
Neutrophils Relative %: 79 %
Platelet Count: 276 10*3/uL (ref 150–400)
RBC: 2.65 MIL/uL — ABNORMAL LOW (ref 3.87–5.11)
RDW: 22.6 % — ABNORMAL HIGH (ref 11.5–15.5)
WBC Count: 12.1 10*3/uL — ABNORMAL HIGH (ref 4.0–10.5)
nRBC: 0 % (ref 0.0–0.2)

## 2020-03-21 MED ORDER — SODIUM CHLORIDE 0.9% FLUSH
10.0000 mL | INTRAVENOUS | Status: AC | PRN
  Administered 2020-03-21: 10 mL
  Filled 2020-03-21: qty 10

## 2020-03-21 MED ORDER — HEPARIN SOD (PORK) LOCK FLUSH 100 UNIT/ML IV SOLN
500.0000 [IU] | Freq: Every day | INTRAVENOUS | Status: AC | PRN
  Administered 2020-03-21: 500 [IU]
  Filled 2020-03-21: qty 5

## 2020-03-21 MED ORDER — SODIUM CHLORIDE 0.9% IV SOLUTION
250.0000 mL | Freq: Once | INTRAVENOUS | Status: AC
  Administered 2020-03-21: 250 mL via INTRAVENOUS
  Filled 2020-03-21: qty 250

## 2020-03-21 NOTE — Progress Notes (Signed)
Patient stable with no complaints upon departure of infusion room.

## 2020-03-21 NOTE — Telephone Encounter (Signed)
I left a vm for [name] to call me.  She has an appt for blood transfusion today.  There is not a note in the chart indicating that she was notified.

## 2020-03-21 NOTE — Patient Instructions (Signed)
Central Line, Adult A central line is a soft, flexible tube (catheter) that can be used to collect blood for testing or to give medicine through a vein. The tip of the central line ends in a large vein just above the heart (vena cava). A central line may be placed because:  You need to get medicines or fluids through an IV tube for a long period of time.  You need nutrition but cannot eat or absorb nutrients.  The veins in your hands or arms are hard to access.  You need a blood transfusion.  You need chemotherapy or dialysis. Types of central lines There are four main types of central lines:  Peripherally inserted central catheter (PICC) line. This type is used for intermediate access to long-term access of one week or more. It can be used to draw blood and give fluids or medicines. A PICC looks like an IV tube, but it goes up the arm to the heart. It is usually inserted in the upper arm and taped in place on the arm.  Tunneled central line. This type is used for long-term therapy and dialysis. It is placed in a large vein in the neck, chest, or groin. It is inserted through a small incision made over the vein, then it is advanced into the heart. It is tunneled under the skin and brought out through a second incision.  Non-tunneled central line. This type is used for short-term access, usually of a maximum of 7 days. It is often used in the emergency department. It is inserted in the neck, chest, or groin.  Implanted port. This type is used for long-term therapy. It can stay in place longer than other types of central lines. It is normally inserted in the upper chest, but it can also be placed in the upper arm or the abdomen. It is inserted and removed with surgery, and it is accessed using a needle. The type of central line that you receive depends on how long you will need it, your medical condition, and the condition of your veins. What are the risks? Using any type of central line has  risks to be aware of, including:  Infection.  A blood clot that blocks the central line or forms in the vein and travels to the heart.  Bleeding from the place where the central line was inserted.  Developing a hole or crack within the central line. If this happens, the central line will need to be replaced.  Central line failure. Follow these instructions at home: Flushing and cleaning the central line   Follow instructions from your health care provider about flushing and cleaning the central line and the area around it.  Only flush with sterile supplies. Use supplies from your health care provider, a pharmacy, or another source that is recommended by your health care provider.  Before you flush the central line or clean the central line or the area around it: ? Wash your hands with soap and water or alcohol-based hand sanitizer. ? Clean the central line hub with rubbing alcohol. Caring for the incision or central line site  Check your incision or central line site every day for signs of infection. Check for: ? Redness, swelling, or pain. ? Fluid or blood. ? Warmth. ? Pus or a bad smell. General instructions  Follow instructions from your health care provider for the type of device that you have.  Keep the tube clamped, unless it is being used.  Keep your supplies in   a clean, dry location.  If the central line accidentally gets pulled on, make sure: ? The bandage (dressing) is okay. ? There is no bleeding. ? The line has not been pulled out.  Do not use scissors or sharp objects near the tube.  Do not swim or let the central line soak in the tub.  Ask your health care provider what activities are safe for you. You may be restricted from lifting or making repetitive arm movements on the side of your central line.  Take over-the-counter and prescription medicines only as told by your health care provider.  Keep the insertion site of your central line clean and dry at  all times.  Keep your dressing dry. If it gets wet, have it changed as soon as possible.  Keep all follow-up visits as told by your health care provider. This is important. Disposal of supplies  Throw away any syringes in a disposal container that is meant for sharp items (sharps container). You can buy a sharps container from a pharmacy, or you can make one by using an empty hard plastic bottle with a cover.  Place any used dressings or infusion bags into a plastic bag. Throw that bag in the trash. Contact a health care provider if:  You have redness, swelling, or pain around your incision.  You have fluid or blood coming from your incision.  Your incision feels warm to the touch.  You have pus or a bad smell coming from your incision. Get help right away if:  You have: ? A fever or chills. ? Shortness of breath. ? Chest pain or a racing heart beat. ? Swelling in your neck, face, chest, or arm on the side of your central line.  You feel dizzy or you faint.  Your incision or central line site has red streaks spreading away from the area.  Your incision or central line site is bleeding and does not stop.  Your central line is difficult to flush or will not flush.  You do not get a blood return from the central line.  Your central line gets loose or damaged or comes out.  Your catheter leaks when flushed or when fluids are infused into it. Summary  A central line is a soft, flexible tube (catheter) that can be used to give medicine through a vein.  Follow specific instructions from your health care provider for the type of device that you have.  Keep the insertion site of your central line clean and dry at all times.  Keep the tube clamped, unless it is being used. This information is not intended to replace advice given to you by your health care provider. Make sure you discuss any questions you have with your health care provider. Document Revised: 07/22/2018  Document Reviewed: 05/03/2016 Elsevier Patient Education  2020 [employer]  

## 2020-03-21 NOTE — Telephone Encounter (Signed)
Critical Value: Hgb 6.7 Sandi Mealy, PA-C notified.

## 2020-03-21 NOTE — Patient Instructions (Signed)

## 2020-03-22 LAB — TYPE AND SCREEN
ABO/RH(D): A NEG
Antibody Screen: NEGATIVE
Unit division: 0

## 2020-03-22 LAB — BPAM RBC
Blood Product Expiration Date: 202112282359
ISSUE DATE / TIME: 202112071444
Unit Type and Rh: 600

## 2020-03-23 ENCOUNTER — Inpatient Hospital Stay (HOSPITAL_BASED_OUTPATIENT_CLINIC_OR_DEPARTMENT_OTHER): Payer: Medicaid Other | Admitting: Hematology

## 2020-03-23 ENCOUNTER — Other Ambulatory Visit: Payer: Self-pay

## 2020-03-23 ENCOUNTER — Encounter: Payer: Self-pay | Admitting: Hematology

## 2020-03-23 ENCOUNTER — Inpatient Hospital Stay: Payer: Medicaid Other

## 2020-03-23 VITALS — BP 146/68 | HR 69 | Temp 98.8°F | Resp 16

## 2020-03-23 VITALS — BP 132/65 | HR 84 | Temp 98.0°F | Resp 18 | Ht 65.0 in | Wt 263.8 lb

## 2020-03-23 DIAGNOSIS — I780 Hereditary hemorrhagic telangiectasia: Secondary | ICD-10-CM

## 2020-03-23 DIAGNOSIS — Z95828 Presence of other vascular implants and grafts: Secondary | ICD-10-CM

## 2020-03-23 DIAGNOSIS — D500 Iron deficiency anemia secondary to blood loss (chronic): Secondary | ICD-10-CM

## 2020-03-23 DIAGNOSIS — D649 Anemia, unspecified: Secondary | ICD-10-CM

## 2020-03-23 DIAGNOSIS — Z5112 Encounter for antineoplastic immunotherapy: Secondary | ICD-10-CM | POA: Diagnosis not present

## 2020-03-23 LAB — CBC WITH DIFFERENTIAL (CANCER CENTER ONLY)
Abs Immature Granulocytes: 0.08 10*3/uL — ABNORMAL HIGH (ref 0.00–0.07)
Basophils Absolute: 0 10*3/uL (ref 0.0–0.1)
Basophils Relative: 0 %
Eosinophils Absolute: 0.2 10*3/uL (ref 0.0–0.5)
Eosinophils Relative: 1 %
HCT: 23.2 % — ABNORMAL LOW (ref 36.0–46.0)
Hemoglobin: 7 g/dL — ABNORMAL LOW (ref 12.0–15.0)
Immature Granulocytes: 1 %
Lymphocytes Relative: 13 %
Lymphs Abs: 2 10*3/uL (ref 0.7–4.0)
MCH: 26.1 pg (ref 26.0–34.0)
MCHC: 30.2 g/dL (ref 30.0–36.0)
MCV: 86.6 fL (ref 80.0–100.0)
Monocytes Absolute: 0.5 10*3/uL (ref 0.1–1.0)
Monocytes Relative: 3 %
Neutro Abs: 12.5 10*3/uL — ABNORMAL HIGH (ref 1.7–7.7)
Neutrophils Relative %: 82 %
Platelet Count: 296 10*3/uL (ref 150–400)
RBC: 2.68 MIL/uL — ABNORMAL LOW (ref 3.87–5.11)
RDW: 22.2 % — ABNORMAL HIGH (ref 11.5–15.5)
WBC Count: 15.2 10*3/uL — ABNORMAL HIGH (ref 4.0–10.5)
nRBC: 0.2 % (ref 0.0–0.2)

## 2020-03-23 LAB — CMP (CANCER CENTER ONLY)
ALT: 7 U/L (ref 0–44)
AST: 13 U/L — ABNORMAL LOW (ref 15–41)
Albumin: 3.1 g/dL — ABNORMAL LOW (ref 3.5–5.0)
Alkaline Phosphatase: 70 U/L (ref 38–126)
Anion gap: 14 (ref 5–15)
BUN: 12 mg/dL (ref 6–20)
CO2: 22 mmol/L (ref 22–32)
Calcium: 9.1 mg/dL (ref 8.9–10.3)
Chloride: 108 mmol/L (ref 98–111)
Creatinine: 0.84 mg/dL (ref 0.44–1.00)
GFR, Estimated: >60 mL/min (ref >60)
Glucose, Bld: 148 mg/dL — ABNORMAL HIGH (ref 70–99)
Potassium: 3.2 mmol/L — ABNORMAL LOW (ref 3.5–5.1)
Sodium: 144 mmol/L (ref 135–145)
Total Bilirubin: 0.2 mg/dL — ABNORMAL LOW (ref 0.3–1.2)
Total Protein: 6.7 g/dL (ref 6.5–8.1)

## 2020-03-23 LAB — PREPARE RBC (CROSSMATCH)

## 2020-03-23 MED ORDER — ACETAMINOPHEN 325 MG PO TABS
ORAL_TABLET | ORAL | Status: AC
  Filled 2020-03-23: qty 2

## 2020-03-23 MED ORDER — SODIUM CHLORIDE 0.9% FLUSH
10.0000 mL | Freq: Once | INTRAVENOUS | Status: AC
  Administered 2020-03-23: 10 mL
  Filled 2020-03-23: qty 10

## 2020-03-23 MED ORDER — SODIUM CHLORIDE 0.9 % IV SOLN
Freq: Once | INTRAVENOUS | Status: AC
  Filled 2020-03-23: qty 250

## 2020-03-23 MED ORDER — SODIUM CHLORIDE 0.9 % IV SOLN
125.0000 mg | Freq: Once | INTRAVENOUS | Status: AC
  Administered 2020-03-23: 125 mg via INTRAVENOUS
  Filled 2020-03-23: qty 10

## 2020-03-23 MED ORDER — SODIUM CHLORIDE 0.9% FLUSH
10.0000 mL | INTRAVENOUS | Status: DC | PRN
  Administered 2020-03-23: 10 mL
  Filled 2020-03-23: qty 10

## 2020-03-23 MED ORDER — SODIUM CHLORIDE 0.9% IV SOLUTION
250.0000 mL | Freq: Once | INTRAVENOUS | Status: DC
  Filled 2020-03-23: qty 250

## 2020-03-23 MED ORDER — DIPHENHYDRAMINE HCL 25 MG PO CAPS
25.0000 mg | ORAL_CAPSULE | Freq: Once | ORAL | Status: AC
  Administered 2020-03-23: 25 mg via ORAL

## 2020-03-23 MED ORDER — ACETAMINOPHEN 325 MG PO TABS
650.0000 mg | ORAL_TABLET | Freq: Once | ORAL | Status: AC
  Administered 2020-03-23: 650 mg via ORAL

## 2020-03-23 MED ORDER — HEPARIN SOD (PORK) LOCK FLUSH 100 UNIT/ML IV SOLN
500.0000 [IU] | Freq: Once | INTRAVENOUS | Status: AC | PRN
  Administered 2020-03-23: 500 [IU]
  Filled 2020-03-23: qty 5

## 2020-03-23 MED ORDER — SODIUM CHLORIDE 0.9 % IV SOLN
5.0000 mg/kg | Freq: Once | INTRAVENOUS | Status: AC
  Administered 2020-03-23: 600 mg via INTRAVENOUS
  Filled 2020-03-23: qty 8

## 2020-03-23 MED ORDER — DIPHENHYDRAMINE HCL 25 MG PO CAPS
ORAL_CAPSULE | ORAL | Status: AC
  Filled 2020-03-23: qty 1

## 2020-03-23 NOTE — Progress Notes (Signed)
Patient stable upon leaving infusion room.  

## 2020-03-23 NOTE — Progress Notes (Signed)
Per Dr Burr Medico, ok to give Avastin today with no urine prt today and 03/16/2020 urine prt 300.

## 2020-03-23 NOTE — Patient Instructions (Signed)
Sodium Ferric Gluconate Complex injection What is this medicine? SODIUM FERRIC GLUCONATE COMPLEX (SOE dee um FER ik GLOO koe nate KOM pleks) is an iron replacement. It is used with epoetin therapy to treat low iron levels in patients who are receiving hemodialysis. This medicine may be used for other purposes; ask your health care provider or pharmacist if you have questions. COMMON BRAND NAME(S): Ferrlecit, Nulecit What should I tell my health care provider before I take this medicine? They need to know if you have any of the following conditions:  anemia that is not from iron deficiency  high levels of iron in the body  an unusual or allergic reaction to iron, benzyl alcohol, other medicines, foods, dyes, or preservatives  pregnant or are trying to become pregnant  breast-feeding How should I use this medicine? This medicine is for infusion into a vein. It is given by a health care professional in a hospital or clinic setting. Talk to your pediatrician regarding the use of this medicine in children. While this drug may be prescribed for children as young as 6 years old for selected conditions, precautions do apply. Overdosage: If you think you have taken too much of this medicine contact a poison control center or emergency room at once. NOTE: This medicine is only for you. Do not share this medicine with others. What if I miss a dose? It is important not to miss your dose. Call your doctor or health care professional if you are unable to keep an appointment. What may interact with this medicine? Do not take this medicine with any of the following medications:  deferoxamine  dimercaprol  other iron products This medicine may also interact with the following medications:  chloramphenicol  deferasirox  medicine for blood pressure like enalapril This list may not describe all possible interactions. Give your health care provider a list of all the medicines, herbs,  non-prescription drugs, or dietary supplements you use. Also tell them if you smoke, drink alcohol, or use illegal drugs. Some items may interact with your medicine. What should I watch for while using this medicine? Your condition will be monitored carefully while you are receiving this medicine. Visit your doctor for check-ups as directed. What side effects may I notice from receiving this medicine? Side effects that you should report to your doctor or health care professional as soon as possible:  allergic reactions like skin rash, itching or hives, swelling of the face, lips, or tongue  breathing problems  changes in hearing  changes in vision  chills, flushing, or sweating  fast, irregular heartbeat  feeling faint or lightheaded, falls  fever, flu-like symptoms  high or low blood pressure  pain, tingling, numbness in the hands or feet  severe pain in the chest, back, flanks, or groin  swelling of the ankles, feet, hands  trouble passing urine or change in the amount of urine  unusually weak or tired Side effects that usually do not require medical attention (report to your doctor or health care professional if they continue or are bothersome):  cramps  dark colored stools  diarrhea  headache  nausea, vomiting  stomach upset This list may not describe all possible side effects. Call your doctor for medical advice about side effects. You may report side effects to FDA at 1-800-FDA-1088. Where should I keep my medicine? This drug is given in a hospital or clinic and will not be stored at home. NOTE: This sheet is a summary. It may not cover all   possible information. If you have questions about this medicine, talk to your doctor, pharmacist, or health care provider.  2020 Elsevier/Gold Standard (2007-12-02 15:58:57)   Bevacizumab injection What is this medicine? BEVACIZUMAB (be va SIZ yoo mab) is a monoclonal antibody. It is used to treat many types of  cancer. This medicine may be used for other purposes; ask your health care provider or pharmacist if you have questions. COMMON BRAND NAME(S): Avastin, MVASI, Zirabev What should I tell my health care provider before I take this medicine? They need to know if you have any of these conditions:  diabetes  heart disease  high blood pressure  history of coughing up blood  prior anthracycline chemotherapy (e.g., doxorubicin, daunorubicin, epirubicin)  recent or ongoing radiation therapy  recent or planning to have surgery  stroke  an unusual or allergic reaction to bevacizumab, hamster proteins, mouse proteins, other medicines, foods, dyes, or preservatives  pregnant or trying to get pregnant  breast-feeding How should I use this medicine? This medicine is for infusion into a vein. It is given by a health care professional in a hospital or clinic setting. Talk to your pediatrician regarding the use of this medicine in children. Special care may be needed. Overdosage: If you think you have taken too much of this medicine contact a poison control center or emergency room at once. NOTE: This medicine is only for you. Do not share this medicine with others. What if I miss a dose? It is important not to miss your dose. Call your doctor or health care professional if you are unable to keep an appointment. What may interact with this medicine? Interactions are not expected. This list may not describe all possible interactions. Give your health care provider a list of all the medicines, herbs, non-prescription drugs, or dietary supplements you use. Also tell them if you smoke, drink alcohol, or use illegal drugs. Some items may interact with your medicine. What should I watch for while using this medicine? Your condition will be monitored carefully while you are receiving this medicine. You will need important blood work and urine testing done while you are taking this medicine. This  medicine may increase your risk to bruise or bleed. Call your doctor or health care professional if you notice any unusual bleeding. Before having surgery, talk to your health care provider to make sure it is ok. This drug can increase the risk of poor healing of your surgical site or wound. You will need to stop this drug for 28 days before surgery. After surgery, wait at least 28 days before restarting this drug. Make sure the surgical site or wound is healed enough before restarting this drug. Talk to your health care provider if questions. Do not become pregnant while taking this medicine or for 6 months after stopping it. Women should inform their doctor if they wish to become pregnant or think they might be pregnant. There is a potential for serious side effects to an unborn child. Talk to your health care professional or pharmacist for more information. Do not breast-feed an infant while taking this medicine and for 6 months after the last dose. This medicine has caused ovarian failure in some women. This medicine may interfere with the ability to have a child. You should talk to your doctor or health care professional if you are concerned about your fertility. What side effects may I notice from receiving this medicine? Side effects that you should report to your doctor or health care professional  as soon as possible:  allergic reactions like skin rash, itching or hives, swelling of the face, lips, or tongue  chest pain or chest tightness  chills  coughing up blood  high fever  seizures  severe constipation  signs and symptoms of bleeding such as bloody or black, tarry stools; red or dark-brown urine; spitting up blood or brown material that looks like coffee grounds; red spots on the skin; unusual bruising or bleeding from the eye, gums, or nose  signs and symptoms of a blood clot such as breathing problems; chest pain; severe, sudden headache; pain, swelling, warmth in the  leg  signs and symptoms of a stroke like changes in vision; confusion; trouble speaking or understanding; severe headaches; sudden numbness or weakness of the face, arm or leg; trouble walking; dizziness; loss of balance or coordination  stomach pain  sweating  swelling of legs or ankles  vomiting  weight gain Side effects that usually do not require medical attention (report to your doctor or health care professional if they continue or are bothersome):  back pain  changes in taste  decreased appetite  dry skin  nausea  tiredness This list may not describe all possible side effects. Call your doctor for medical advice about side effects. You may report side effects to FDA at 1-800-FDA-1088. Where should I keep my medicine? This drug is given in a hospital or clinic and will not be stored at home. NOTE: This sheet is a summary. It may not cover all possible information. If you have questions about this medicine, talk to your doctor, pharmacist, or health care provider.  2020 Elsevier/Gold Standard (2019-01-27 10:50:46)   Blood Transfusion, Adult, Care After This sheet gives you information about how to care for yourself after your procedure. Your doctor may also give you more specific instructions. If you have problems or questions, contact your doctor. What can I expect after the procedure? After the procedure, it is common to have:  Bruising and soreness at the IV site.  A fever or chills on the day of the procedure. This may be your body's response to the new blood cells received.  A headache. Follow these instructions at home: Insertion site care      Follow instructions from your doctor about how to take care of your insertion site. This is where an IV tube was put into your vein. Make sure you: ? Wash your hands with soap and water before and after you change your bandage (dressing). If you cannot use soap and water, use hand sanitizer. ? Change your bandage  as told by your doctor.  Check your insertion site every day for signs of infection. Check for: ? Redness, swelling, or pain. ? Bleeding from the site. ? Warmth. ? Pus or a bad smell. General instructions  Take over-the-counter and prescription medicines only as told by your doctor.  Rest as told by your doctor.  Go back to your normal activities as told by your doctor.  Keep all follow-up visits as told by your doctor. This is important. Contact a doctor if:  You have itching or red, swollen areas of skin (hives).  You feel worried or nervous (anxious).  You feel weak after doing your normal activities.  You have redness, swelling, warmth, or pain around the insertion site.  You have blood coming from the insertion site, and the blood does not stop with pressure.  You have pus or a bad smell coming from the insertion site. Get help  right away if:  You have signs of a serious reaction. This may be coming from an allergy or the body's defense system (immune system). Signs include: ? Trouble breathing or shortness of breath. ? Swelling of the face or feeling warm (flushed). ? Fever or chills. ? Head, chest, or back pain. ? Dark pee (urine) or blood in the pee. ? Widespread rash. ? Fast heartbeat. ? Feeling dizzy or light-headed. You may receive your blood transfusion in an outpatient setting. If so, you will be told whom to contact to report any reactions. These symptoms may be an emergency. Do not wait to see if the symptoms will go away. Get medical help right away. Call your local emergency services (911 in the [place] Do not drive yourself to the hospital. Summary  Bruising and soreness at the IV site are common.  Check your insertion site every day for signs of infection.  Rest as told by your doctor. Go back to your normal activities as told by your doctor.  Get help right away if you have signs of a serious reaction. This information is not intended to replace  advice given to you by your health care provider. Make sure you discuss any questions you have with your health care provider. Document Revised: 09/24/2018 Document Reviewed: 09/24/2018 Elsevier Patient Education  [place].

## 2020-03-24 ENCOUNTER — Telehealth: Payer: Self-pay | Admitting: Hematology

## 2020-03-24 LAB — TYPE AND SCREEN
ABO/RH(D): A NEG
Antibody Screen: NEGATIVE
Unit division: 0

## 2020-03-24 LAB — BPAM RBC
Blood Product Expiration Date: 202112282359
ISSUE DATE / TIME: 202112091344
Unit Type and Rh: 600

## 2020-03-24 NOTE — Telephone Encounter (Signed)
Scheduled appts per 12/9 los. Pt confirmed next appt date and time.

## 2020-03-27 ENCOUNTER — Inpatient Hospital Stay: Payer: Medicaid Other

## 2020-03-27 ENCOUNTER — Other Ambulatory Visit: Payer: Self-pay

## 2020-03-27 VITALS — BP 142/63 | HR 72 | Temp 98.7°F | Resp 18 | Ht 66.0 in | Wt 264.5 lb

## 2020-03-27 DIAGNOSIS — Z5112 Encounter for antineoplastic immunotherapy: Secondary | ICD-10-CM | POA: Diagnosis not present

## 2020-03-27 DIAGNOSIS — Z95828 Presence of other vascular implants and grafts: Secondary | ICD-10-CM

## 2020-03-27 DIAGNOSIS — D500 Iron deficiency anemia secondary to blood loss (chronic): Secondary | ICD-10-CM

## 2020-03-27 DIAGNOSIS — M1990 Unspecified osteoarthritis, unspecified site: Secondary | ICD-10-CM | POA: Diagnosis not present

## 2020-03-27 MED ORDER — SODIUM CHLORIDE 0.9 % IV SOLN
INTRAVENOUS | Status: DC
  Filled 2020-03-27: qty 250

## 2020-03-27 MED ORDER — SODIUM CHLORIDE 0.9% FLUSH
10.0000 mL | Freq: Once | INTRAVENOUS | Status: DC
  Filled 2020-03-27: qty 10

## 2020-03-27 MED ORDER — HEPARIN SOD (PORK) LOCK FLUSH 100 UNIT/ML IV SOLN
500.0000 [IU] | Freq: Once | INTRAVENOUS | Status: DC
  Filled 2020-03-27: qty 5

## 2020-03-27 MED ORDER — SODIUM CHLORIDE 0.9 % IV SOLN
250.0000 mg | Freq: Once | INTRAVENOUS | Status: AC
  Administered 2020-03-27: 250 mg via INTRAVENOUS
  Filled 2020-03-27: qty 20

## 2020-03-27 NOTE — Progress Notes (Signed)
Pt discharged in no apparent distress. Pt left ambulatory without assistance.  Pt aware of discharge instructions and verbalized understanding and had no further questions. Pt did not wait for 30 min post observation.

## 2020-03-27 NOTE — Progress Notes (Signed)
Pt has tolerated Ferrlecit 125 mg over 1 hour.  Per Dr. Burr Medico, ok to infuse 250 mg over 2 hours.  Kennith Center, Pharm.D., CPP 03/27/2020@3 :26 PM

## 2020-03-27 NOTE — Patient Instructions (Signed)
Sodium Ferric Gluconate Complex injection What is this medicine? SODIUM FERRIC GLUCONATE COMPLEX (SOE dee um FER ik GLOO koe nate KOM pleks) is an iron replacement. It is used with epoetin therapy to treat low iron levels in patients who are receiving hemodialysis. This medicine may be used for other purposes; ask your health care provider or pharmacist if you have questions. COMMON BRAND NAME(S): Ferrlecit, Nulecit What should I tell my health care provider before I take this medicine? They need to know if you have any of the following conditions:  anemia that is not from iron deficiency  high levels of iron in the body  an unusual or allergic reaction to iron, benzyl alcohol, other medicines, foods, dyes, or preservatives  pregnant or are trying to become pregnant  breast-feeding How should I use this medicine? This medicine is for infusion into a vein. It is given by a health care professional in a hospital or clinic setting. Talk to your pediatrician regarding the use of this medicine in children. While this drug may be prescribed for children as young as 6 years old for selected conditions, precautions do apply. Overdosage: If you think you have taken too much of this medicine contact a poison control center or emergency room at once. NOTE: This medicine is only for you. Do not share this medicine with others. What if I miss a dose? It is important not to miss your dose. Call your doctor or health care professional if you are unable to keep an appointment. What may interact with this medicine? Do not take this medicine with any of the following medications:  deferoxamine  dimercaprol  other iron products This medicine may also interact with the following medications:  chloramphenicol  deferasirox  medicine for blood pressure like enalapril This list may not describe all possible interactions. Give your health care provider a list of all the medicines, herbs,  non-prescription drugs, or dietary supplements you use. Also tell them if you smoke, drink alcohol, or use illegal drugs. Some items may interact with your medicine. What should I watch for while using this medicine? Your condition will be monitored carefully while you are receiving this medicine. Visit your doctor for check-ups as directed. What side effects may I notice from receiving this medicine? Side effects that you should report to your doctor or health care professional as soon as possible:  allergic reactions like skin rash, itching or hives, swelling of the face, lips, or tongue  breathing problems  changes in hearing  changes in vision  chills, flushing, or sweating  fast, irregular heartbeat  feeling faint or lightheaded, falls  fever, flu-like symptoms  high or low blood pressure  pain, tingling, numbness in the hands or feet  severe pain in the chest, back, flanks, or groin  swelling of the ankles, feet, hands  trouble passing urine or change in the amount of urine  unusually weak or tired Side effects that usually do not require medical attention (report to your doctor or health care professional if they continue or are bothersome):  cramps  dark colored stools  diarrhea  headache  nausea, vomiting  stomach upset This list may not describe all possible side effects. Call your doctor for medical advice about side effects. You may report side effects to FDA at 1-800-FDA-1088. Where should I keep my medicine? This drug is given in a hospital or clinic and will not be stored at home. NOTE: This sheet is a summary. It may not cover all   possible information. If you have questions about this medicine, talk to your doctor, pharmacist, or health care provider.  2020 Elsevier/Gold Standard (2007-12-02 15:58:57)   Bevacizumab injection What is this medicine? BEVACIZUMAB (be va SIZ yoo mab) is a monoclonal antibody. It is used to treat many types of  cancer. This medicine may be used for other purposes; ask your health care provider or pharmacist if you have questions. COMMON BRAND NAME(S): Avastin, MVASI, Zirabev What should I tell my health care provider before I take this medicine? They need to know if you have any of these conditions:  diabetes  heart disease  high blood pressure  history of coughing up blood  prior anthracycline chemotherapy (e.g., doxorubicin, daunorubicin, epirubicin)  recent or ongoing radiation therapy  recent or planning to have surgery  stroke  an unusual or allergic reaction to bevacizumab, hamster proteins, mouse proteins, other medicines, foods, dyes, or preservatives  pregnant or trying to get pregnant  breast-feeding How should I use this medicine? This medicine is for infusion into a vein. It is given by a health care professional in a hospital or clinic setting. Talk to your pediatrician regarding the use of this medicine in children. Special care may be needed. Overdosage: If you think you have taken too much of this medicine contact a poison control center or emergency room at once. NOTE: This medicine is only for you. Do not share this medicine with others. What if I miss a dose? It is important not to miss your dose. Call your doctor or health care professional if you are unable to keep an appointment. What may interact with this medicine? Interactions are not expected. This list may not describe all possible interactions. Give your health care provider a list of all the medicines, herbs, non-prescription drugs, or dietary supplements you use. Also tell them if you smoke, drink alcohol, or use illegal drugs. Some items may interact with your medicine. What should I watch for while using this medicine? Your condition will be monitored carefully while you are receiving this medicine. You will need important blood work and urine testing done while you are taking this medicine. This  medicine may increase your risk to bruise or bleed. Call your doctor or health care professional if you notice any unusual bleeding. Before having surgery, talk to your health care provider to make sure it is ok. This drug can increase the risk of poor healing of your surgical site or wound. You will need to stop this drug for 28 days before surgery. After surgery, wait at least 28 days before restarting this drug. Make sure the surgical site or wound is healed enough before restarting this drug. Talk to your health care provider if questions. Do not become pregnant while taking this medicine or for 6 months after stopping it. Women should inform their doctor if they wish to become pregnant or think they might be pregnant. There is a potential for serious side effects to an unborn child. Talk to your health care professional or pharmacist for more information. Do not breast-feed an infant while taking this medicine and for 6 months after the last dose. This medicine has caused ovarian failure in some women. This medicine may interfere with the ability to have a child. You should talk to your doctor or health care professional if you are concerned about your fertility. What side effects may I notice from receiving this medicine? Side effects that you should report to your doctor or health care professional  as soon as possible:  allergic reactions like skin rash, itching or hives, swelling of the face, lips, or tongue  chest pain or chest tightness  chills  coughing up blood  high fever  seizures  severe constipation  signs and symptoms of bleeding such as bloody or black, tarry stools; red or dark-brown urine; spitting up blood or brown material that looks like coffee grounds; red spots on the skin; unusual bruising or bleeding from the eye, gums, or nose  signs and symptoms of a blood clot such as breathing problems; chest pain; severe, sudden headache; pain, swelling, warmth in the  leg  signs and symptoms of a stroke like changes in vision; confusion; trouble speaking or understanding; severe headaches; sudden numbness or weakness of the face, arm or leg; trouble walking; dizziness; loss of balance or coordination  stomach pain  sweating  swelling of legs or ankles  vomiting  weight gain Side effects that usually do not require medical attention (report to your doctor or health care professional if they continue or are bothersome):  back pain  changes in taste  decreased appetite  dry skin  nausea  tiredness This list may not describe all possible side effects. Call your doctor for medical advice about side effects. You may report side effects to FDA at 1-800-FDA-1088. Where should I keep my medicine? This drug is given in a hospital or clinic and will not be stored at home. NOTE: This sheet is a summary. It may not cover all possible information. If you have questions about this medicine, talk to your doctor, pharmacist, or health care provider.  2020 Elsevier/Gold Standard (2019-01-27 10:50:46)   Blood Transfusion, Adult, Care After This sheet gives you information about how to care for yourself after your procedure. Your doctor may also give you more specific instructions. If you have problems or questions, contact your doctor. What can I expect after the procedure? After the procedure, it is common to have:  Bruising and soreness at the IV site.  A fever or chills on the day of the procedure. This may be your body's response to the new blood cells received.  A headache. Follow these instructions at home: Insertion site care      Follow instructions from your doctor about how to take care of your insertion site. This is where an IV tube was put into your vein. Make sure you: ? Wash your hands with soap and water before and after you change your bandage (dressing). If you cannot use soap and water, use hand sanitizer. ? Change your bandage  as told by your doctor.  Check your insertion site every day for signs of infection. Check for: ? Redness, swelling, or pain. ? Bleeding from the site. ? Warmth. ? Pus or a bad smell. General instructions  Take over-the-counter and prescription medicines only as told by your doctor.  Rest as told by your doctor.  Go back to your normal activities as told by your doctor.  Keep all follow-up visits as told by your doctor. This is important. Contact a doctor if:  You have itching or red, swollen areas of skin (hives).  You feel worried or nervous (anxious).  You feel weak after doing your normal activities.  You have redness, swelling, warmth, or pain around the insertion site.  You have blood coming from the insertion site, and the blood does not stop with pressure.  You have pus or a bad smell coming from the insertion site. Get help  right away if:  You have signs of a serious reaction. This may be coming from an allergy or the body's defense system (immune system). Signs include: ? Trouble breathing or shortness of breath. ? Swelling of the face or feeling warm (flushed). ? Fever or chills. ? Head, chest, or back pain. ? Dark pee (urine) or blood in the pee. ? Widespread rash. ? Fast heartbeat. ? Feeling dizzy or light-headed. You may receive your blood transfusion in an outpatient setting. If so, you will be told whom to contact to report any reactions. These symptoms may be an emergency. Do not wait to see if the symptoms will go away. Get medical help right away. Call your local emergency services (911 in the [place] Do not drive yourself to the hospital. Summary  Bruising and soreness at the IV site are common.  Check your insertion site every day for signs of infection.  Rest as told by your doctor. Go back to your normal activities as told by your doctor.  Get help right away if you have signs of a serious reaction. This information is not intended to replace  advice given to you by your health care provider. Make sure you discuss any questions you have with your health care provider. Document Revised: 09/24/2018 Document Reviewed: 09/24/2018 Elsevier Patient Education  [place].

## 2020-03-28 DIAGNOSIS — M1990 Unspecified osteoarthritis, unspecified site: Secondary | ICD-10-CM | POA: Diagnosis not present

## 2020-03-29 DIAGNOSIS — M1990 Unspecified osteoarthritis, unspecified site: Secondary | ICD-10-CM | POA: Diagnosis not present

## 2020-03-30 DIAGNOSIS — M1990 Unspecified osteoarthritis, unspecified site: Secondary | ICD-10-CM | POA: Diagnosis not present

## 2020-03-31 ENCOUNTER — Telehealth: Payer: Self-pay | Admitting: *Deleted

## 2020-03-31 DIAGNOSIS — M1990 Unspecified osteoarthritis, unspecified site: Secondary | ICD-10-CM | POA: Diagnosis not present

## 2020-03-31 NOTE — Telephone Encounter (Signed)
Please call Peggy House at [facility] [phone] need orders for PCS.

## 2020-03-31 NOTE — Telephone Encounter (Signed)
Returned call to Gustine at [employer]. States she needs form [id number] completed and faxed to her at [phone] for patient to continue to receive PCS. Will place form in Red Team's box.

## 2020-04-03 ENCOUNTER — Other Ambulatory Visit: Payer: Medicaid Other

## 2020-04-03 ENCOUNTER — Ambulatory Visit: Payer: Medicaid Other

## 2020-04-03 DIAGNOSIS — M1990 Unspecified osteoarthritis, unspecified site: Secondary | ICD-10-CM | POA: Diagnosis not present

## 2020-04-03 NOTE — Telephone Encounter (Signed)
Patient's last OV was 09/27/2019. In order for PCS to be considered, last OV must be within 90 days. Patient is overdue for 1 month f/u so next appt should be for in-person. Attempted to relay this to patient. No answer. Left message on VM requesting return call. Hubbard Hartshorn, BSN, RN-BC

## 2020-04-03 NOTE — Telephone Encounter (Signed)
Patient called back. Appt scheduled for 04/10/2020 at 0845 with PCP. Hubbard Hartshorn, BSN, RN-BC

## 2020-04-04 DIAGNOSIS — M1990 Unspecified osteoarthritis, unspecified site: Secondary | ICD-10-CM | POA: Diagnosis not present

## 2020-04-05 DIAGNOSIS — M1990 Unspecified osteoarthritis, unspecified site: Secondary | ICD-10-CM | POA: Diagnosis not present

## 2020-04-06 DIAGNOSIS — M1990 Unspecified osteoarthritis, unspecified site: Secondary | ICD-10-CM | POA: Diagnosis not present

## 2020-04-07 DIAGNOSIS — M1990 Unspecified osteoarthritis, unspecified site: Secondary | ICD-10-CM | POA: Diagnosis not present

## 2020-04-10 ENCOUNTER — Inpatient Hospital Stay: Payer: Medicaid Other

## 2020-04-10 ENCOUNTER — Encounter: Payer: Medicaid Other | Admitting: Student

## 2020-04-10 ENCOUNTER — Other Ambulatory Visit: Payer: Self-pay

## 2020-04-10 VITALS — BP 132/63 | HR 71 | Temp 98.7°F | Resp 18

## 2020-04-10 DIAGNOSIS — R808 Other proteinuria: Secondary | ICD-10-CM

## 2020-04-10 DIAGNOSIS — Z95828 Presence of other vascular implants and grafts: Secondary | ICD-10-CM

## 2020-04-10 DIAGNOSIS — Z5112 Encounter for antineoplastic immunotherapy: Secondary | ICD-10-CM | POA: Diagnosis not present

## 2020-04-10 DIAGNOSIS — D500 Iron deficiency anemia secondary to blood loss (chronic): Secondary | ICD-10-CM

## 2020-04-10 DIAGNOSIS — I780 Hereditary hemorrhagic telangiectasia: Secondary | ICD-10-CM

## 2020-04-10 DIAGNOSIS — M1990 Unspecified osteoarthritis, unspecified site: Secondary | ICD-10-CM | POA: Diagnosis not present

## 2020-04-10 LAB — CMP (CANCER CENTER ONLY)
ALT: 13 U/L (ref 0–44)
AST: 18 U/L (ref 15–41)
Albumin: 3.3 g/dL — ABNORMAL LOW (ref 3.5–5.0)
Alkaline Phosphatase: 70 U/L (ref 38–126)
Anion gap: 9 (ref 5–15)
BUN: 10 mg/dL (ref 6–20)
CO2: 25 mmol/L (ref 22–32)
Calcium: 9.5 mg/dL (ref 8.9–10.3)
Chloride: 106 mmol/L (ref 98–111)
Creatinine: 0.75 mg/dL (ref 0.44–1.00)
GFR, Estimated: >60 mL/min (ref >60)
Glucose, Bld: 130 mg/dL — ABNORMAL HIGH (ref 70–99)
Potassium: 3.3 mmol/L — ABNORMAL LOW (ref 3.5–5.1)
Sodium: 140 mmol/L (ref 135–145)
Total Bilirubin: <0.2 mg/dL — ABNORMAL LOW (ref 0.3–1.2)
Total Protein: 7 g/dL (ref 6.5–8.1)

## 2020-04-10 LAB — FERRITIN: Ferritin: 48 ng/mL (ref 11–307)

## 2020-04-10 LAB — IRON AND TIBC
Iron: 24 ug/dL — ABNORMAL LOW (ref 41–142)
Saturation Ratios: 9 % — ABNORMAL LOW (ref 21–57)
TIBC: 275 ug/dL (ref 236–444)
UIBC: 251 ug/dL (ref 120–384)

## 2020-04-10 LAB — TOTAL PROTEIN, URINE DIPSTICK: Protein, ur: 300 mg/dL — AB

## 2020-04-10 MED ORDER — SODIUM CHLORIDE 0.9% FLUSH
10.0000 mL | Freq: Once | INTRAVENOUS | Status: AC
  Administered 2020-04-10: 10 mL
  Filled 2020-04-10: qty 10

## 2020-04-10 MED ORDER — HEPARIN SOD (PORK) LOCK FLUSH 100 UNIT/ML IV SOLN
500.0000 [IU] | Freq: Once | INTRAVENOUS | Status: AC | PRN
  Administered 2020-04-10: 500 [IU]
  Filled 2020-04-10: qty 5

## 2020-04-10 MED ORDER — SODIUM CHLORIDE 0.9% FLUSH
10.0000 mL | INTRAVENOUS | Status: DC | PRN
  Administered 2020-04-10: 10 mL
  Filled 2020-04-10: qty 10

## 2020-04-10 MED ORDER — SODIUM CHLORIDE 0.9 % IV SOLN
250.0000 mg | Freq: Once | INTRAVENOUS | Status: AC
  Administered 2020-04-10: 250 mg via INTRAVENOUS
  Filled 2020-04-10: qty 20

## 2020-04-10 MED ORDER — SODIUM CHLORIDE 0.9 % IV SOLN
INTRAVENOUS | Status: DC
  Filled 2020-04-10: qty 250

## 2020-04-10 MED ORDER — SODIUM CHLORIDE 0.9 % IV SOLN
5.0000 mg/kg | Freq: Once | INTRAVENOUS | Status: AC
  Administered 2020-04-10: 600 mg via INTRAVENOUS
  Filled 2020-04-10: qty 16

## 2020-04-10 MED ORDER — SODIUM CHLORIDE 0.9 % IV SOLN
Freq: Once | INTRAVENOUS | Status: DC
  Filled 2020-04-10: qty 250

## 2020-04-10 NOTE — Progress Notes (Signed)
Per Regan Rakers OK to treat with Urine protein of 300

## 2020-04-10 NOTE — Patient Instructions (Signed)
[facility] Discharge Instructions for Patients Receiving Chemotherapy  Today you received the following chemotherapy agents Avastin  To help prevent nausea and vomiting after your treatment, we encourage you to take your nausea medication as directed   If you develop nausea and vomiting that is not controlled by your nausea medication, call the clinic.   BELOW ARE SYMPTOMS THAT SHOULD BE REPORTED IMMEDIATELY:  *FEVER GREATER THAN 100.5 F  *CHILLS WITH OR WITHOUT FEVER  NAUSEA AND VOMITING THAT IS NOT CONTROLLED WITH YOUR NAUSEA MEDICATION  *UNUSUAL SHORTNESS OF BREATH  *UNUSUAL BRUISING OR BLEEDING  TENDERNESS IN MOUTH AND THROAT WITH OR WITHOUT PRESENCE OF ULCERS  *URINARY PROBLEMS  *BOWEL PROBLEMS  UNUSUAL RASH Items with * indicate a potential emergency and should be followed up as soon as possible.  Feel free to call the clinic should you have any questions or concerns. The clinic phone number is [phone].  Please show the CHEMO ALERT CARD at check-in to the Emergency Department and triage nurse.   

## 2020-04-11 DIAGNOSIS — M1990 Unspecified osteoarthritis, unspecified site: Secondary | ICD-10-CM | POA: Diagnosis not present

## 2020-04-12 DIAGNOSIS — M1990 Unspecified osteoarthritis, unspecified site: Secondary | ICD-10-CM | POA: Diagnosis not present

## 2020-04-13 DIAGNOSIS — M1990 Unspecified osteoarthritis, unspecified site: Secondary | ICD-10-CM | POA: Diagnosis not present

## 2020-04-14 DIAGNOSIS — M1990 Unspecified osteoarthritis, unspecified site: Secondary | ICD-10-CM | POA: Diagnosis not present

## 2020-04-17 ENCOUNTER — Other Ambulatory Visit: Payer: Medicaid Other

## 2020-04-17 ENCOUNTER — Ambulatory Visit: Payer: Medicaid Other

## 2020-04-17 ENCOUNTER — Other Ambulatory Visit: Payer: Self-pay | Admitting: Hematology

## 2020-04-17 DIAGNOSIS — N319 Neuromuscular dysfunction of bladder, unspecified: Secondary | ICD-10-CM | POA: Diagnosis not present

## 2020-04-17 DIAGNOSIS — N3941 Urge incontinence: Secondary | ICD-10-CM | POA: Diagnosis not present

## 2020-04-17 NOTE — Telephone Encounter (Signed)
Peggy House with [facility] called regarding PCS form. Explained patient no showed appt on 04/10/2020 to f/u and to discuss need for PCS. Kinnie Feil, BSN, RN-BC

## 2020-04-18 ENCOUNTER — Other Ambulatory Visit: Payer: Self-pay | Admitting: Hematology

## 2020-04-18 MED ORDER — ALPRAZOLAM 0.5 MG PO TABS: 0.5000 mg | ORAL_TABLET | Freq: Every evening | ORAL | 0 refills | Status: DC | PRN

## 2020-04-22 ENCOUNTER — Other Ambulatory Visit: Payer: Self-pay | Admitting: Nurse Practitioner

## 2020-04-22 DIAGNOSIS — I780 Hereditary hemorrhagic telangiectasia: Secondary | ICD-10-CM

## 2020-04-22 NOTE — Progress Notes (Deleted)
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 Increased to weekly on 07/29/19.Reduced to every 2 weeks on 11/12/19 -RestartedAvastin/Zirabevq2weeks on 04/03/18. Reduced to every 4 weeks starting 09/25/19. Increased to every 2 weeks on 01/06/20. Increased dose to 250mg  on 01/20/20 -Amicar 1g bidstarting4/06/2018. Reduced to only as needed on 12/10/18 due toDVT  INTERVAL HISTORY: Peggy House returns for follow up and treatment as scheduled. She received nulecit and avastin on 04/10/20.    REVIEW OF SYSTEMS:   Constitutional: Denies fevers, chills or abnormal weight loss Eyes: Denies blurriness of vision Ears, nose, mouth, throat, and face: Denies mucositis or sore throat Respiratory: Denies cough, dyspnea or wheezes Cardiovascular: Denies palpitation, chest discomfort or lower extremity swelling Gastrointestinal:  Denies nausea, heartburn or change in bowel habits Skin: Denies abnormal skin rashes Lymphatics: Denies new lymphadenopathy or easy bruising Neurological:Denies numbness, tingling or new weaknesses Behavioral/Psych: Mood is stable, no new changes  All other  systems were reviewed with the patient and are negative.  MEDICAL HISTORY:  Past Medical History:  Diagnosis Date  . Anxiety   . Arthritis    knnes,back  . GERD (gastroesophageal reflux disease)   . 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 History of swelling of feet   . Hyperlipidemia   . Hypertension   . Major depressive disorder, recurrent episode ([place]) 06/05/2015  . Obesity   . Snores   . 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 ABDOMINAL HYSTERECTOMY    . CARPAL TUNNEL RELEASE  05/13/2011   Procedure: CARPAL TUNNEL RELEASE;  Surgeon: Nita Sells, MD;  Location: [place];  Service: Orthopedics;  Laterality: Left;  . COLONOSCOPY N/A 03/02/2020   Procedure: COLONOSCOPY;  Surgeon: Ronald Lobo, MD;  Location: [facility] ENDOSCOPY;  Service: Endoscopy;  Laterality: N/A;  . COLONOSCOPY WITH PROPOFOL N/A 04/28/2014   Procedure: COLONOSCOPY WITH PROPOFOL;  Surgeon: Cleotis Nipper, MD;  Location: [facility] ENDOSCOPY;  Service: Endoscopy;  Laterality: N/A;  . DG TOES*L*  2/10   rt  . DILATION AND CURETTAGE OF UTERUS    . ENTEROSCOPY N/A 10/17/2017   Procedure: ENTEROSCOPY;  Surgeon: Otis Brace, MD;  Location: [place] ENDOSCOPY;  Service: Gastroenterology;  Laterality: N/A;  . ESOPHAGOGASTRODUODENOSCOPY N/A 04/10/2014   Procedure: ESOPHAGOGASTRODUODENOSCOPY (EGD);  Surgeon: [name], MD;  Location[facility] ENDOSCOPY;  Service: Endoscopy;  Laterality: N/A;  . ESOPHAGOGASTRODUODENOSCOPY N/A 05/10/2017   Procedure: ESOPHAGOGASTRODUODENOSCOPY (EGD);  Surgeon: Ronald Lobo, MD;  Location: [facility] ENDOSCOPY;  Service: Endoscopy;  Laterality: N/A;  . ESOPHAGOGASTRODUODENOSCOPY N/A 09/22/2017   Procedure: ESOPHAGOGASTRODUODENOSCOPY (EGD);  Surgeon: Clarene Essex, MD;  Location: [place];  Service: Endoscopy;  Laterality: N/A;  bedside  . ESOPHAGOGASTRODUODENOSCOPY N/A  03/02/2020   Procedure: ESOPHAGOGASTRODUODENOSCOPY (EGD);  Surgeon: Ronald Lobo, MD;  Location: [facility];  Service: Endoscopy;  Laterality: N/A;  . ESOPHAGOGASTRODUODENOSCOPY (EGD) WITH PROPOFOL N/A 04/27/2014   Procedure: ESOPHAGOGASTRODUODENOSCOPY (EGD) WITH PROPOFOL;  Surgeon: Cleotis Nipper, MD;  Location: [place];  Service: Endoscopy;  Laterality: N/A;  possible apc  . ESOPHAGOGASTRODUODENOSCOPY (EGD) WITH PROPOFOL N/A 09/30/2017   Procedure: ESOPHAGOGASTRODUODENOSCOPY (EGD) WITH PROPOFOL;  Surgeon: [name], MD;  Location: [place];  Service: Gastroenterology;  Laterality: N/A;  . ESOPHAGOGASTRODUODENOSCOPY (EGD) WITH PROPOFOL N/A 10/01/2017   Procedure: ESOPHAGOGASTRODUODENOSCOPY (EGD) WITH PROPOFOL;  Surgeon: [name], MD;  Location: [place];  Service: Gastroenterology;  Laterality: N/A;  . ESOPHAGOGASTRODUODENOSCOPY (EGD) WITH PROPOFOL N/A 10/08/2017   Procedure: ESOPHAGOGASTRODUODENOSCOPY (EGD) WITH PROPOFOL;  Surgeon: Otis Brace, MD;  Location: [facility] ENDOSCOPY;  Service: Gastroenterology;  Laterality: N/A;  . ESOPHAGOGASTRODUODENOSCOPY (EGD) WITH PROPOFOL N/A 10/17/2017   Procedure: ESOPHAGOGASTRODUODENOSCOPY (EGD) WITH PROPOFOL;  Surgeon: Otis Brace, MD;  Location: [facility] ENDOSCOPY;  Service: Gastroenterology;  Laterality: N/A;  . ESOPHAGOGASTRODUODENOSCOPY (EGD) WITH PROPOFOL N/A 10/19/2017   Procedure: ESOPHAGOGASTRODUODENOSCOPY (EGD) WITH PROPOFOL;  Surgeon: Otis Brace, MD;  Location: [facility] ENDOSCOPY;  Service: Gastroenterology;  Laterality: N/A;  . ESOPHAGOGASTRODUODENOSCOPY (EGD) WITH PROPOFOL N/A 12/04/2018   Procedure: ESOPHAGOGASTRODUODENOSCOPY (EGD) WITH PROPOFOL;  Surgeon: Wilford Corner, MD;  Location: [facility] ENDOS[facility];  Service: Endoscopy;  Laterality: N/A;  . GIVENS CAPSULE STUDY N/A 10/02/2017   Procedure: GIVENS CAPSULE STUDY;  Surgeon: [name], MD;  Location: [place];  Service: Gastroenterology;  Laterality: N/A;  . GIVENS CAPSULE  STUDY N/A 10/08/2017   Procedure: GIVENS CAPSULE STUDY;  Surgeon: Otis Brace, MD;  Location: [place];  Service: Gastroenterology;  Laterality: N/A;  endoscopic placement of capsule  . GIVENS CAPSULE STUDY N/A 03/02/2020   Procedure: GIVENS CAPSULE STUDY;  Surgeon: Ronald Lobo, MD;  Location: [facility] ENDOS[facility];  Service: Endoscopy;  Laterality: N/A;  . HEMOSTASIS CLIP PLACEMENT  12/04/2018   Procedure: HEMOSTASIS CLIP PLACEMENT;  Surgeon: Wilford Corner, MD;  Location: [facility]OS[facility];  Service: Endoscopy;;  . HOT HEMOSTASIS N/A 04/27/2014   Procedure: HOT HEMOSTASIS (ARGON PLASMA COAGULATION/BICAP);  Surgeon: Cleotis Nipper, MD;  Location[facility] ENDOSCOPY;  Service: Endoscopy;  Laterality: N/A;  . HOT HEMOSTASIS N/A 09/30/2017   Procedure: HOT HEMOSTASIS (ARGON PLASMA COAGULATION/BICAP);  Surgeon: [name], MD;  Location: [place];  Service: Gastroenterology;  Laterality: N/A;  . HOT HEMOSTASIS N/A 10/01/2017   Procedure: HOT HEMOSTASIS (ARGON PLASMA COAGULATION/BICAP);  Surgeon: [name], MD;  Location: [place];  Service: Gastroenterology;  Laterality: N/A;  . HOT HEMOSTASIS N/A 10/17/2017   Procedure: HOT HEMOSTASIS (ARGON PLASMA COAGULATION/BICAP);  Surgeon: Otis Brace, MD;  [facility]COPY;  Service: Gastroenterology;  Laterality: N/A;  . HOT HEMOSTASIS N/A 10/19/2017   Procedure: HOT HEMOSTASIS (ARGON PLASMA COAGULATION/BICAP);  Surgeon: Otis Brace, MD;  Location: [facility] ENDOSCOPY;  Service: Gastroenterology;  Laterality: N/A;  . HOT HEMOSTASIS N/A 03/02/2020   Procedure: HOT HEMOSTASIS (ARGON PLASMA COAGULATION/BICAP);  Surgeon: Ronald Lobo, MD;  Location[facility];  Service: Endoscopy;  Laterality: N/A;  . IR IMAGING GUIDED PORT INSERTION  07/08/2018  . L shoulder Surgery  2011  . POLYPECTOMY  03/02/2020   Procedure: POLYPECTOMY;  Surgeon: Ronald Lobo, MD;  Location: [facility] ENDOS[facility];  Service: Endoscopy;;  . SUBMUCOSAL INJECTION  09/22/2017    Procedure: SUBMUCOSAL INJECTION;  Surgeon: Clarene Essex, MD;  Location[name] ENDOSCOPY;  Service: Endoscopy;;  . SUBMUCOSAL INJECTION  12/04/2018   Procedure: SUBMUCOSAL INJECTION;  Surgeon: Michail Sermon,  Evette Doffing, MD;  Location: [facility] ENDOSCOPY;  Service: Endoscopy;;    I have reviewed the social history and family history with the patient and they are unchanged from previous note.  ALLERGIES:  is allergic to feraheme [ferumoxytol], nsaids, tomato, and wasp venom.  MEDICATIONS:  Current Outpatient Medications  Medication Sig Dispense Refill  . Accu-Chek Softclix Lancets lancets Use to check blood sugar before breakfast and before dinner while on steroids 100 each 1  . acitretin (SORIATANE) 10 MG capsule Take 10 mg by mouth daily.    [name] albuterol (PROVENTIL HFA;VENTOLIN HFA) 108 (90 Base) MCG/ACT inhaler Inhale 1-2 puffs into the lungs every 6 (six) hours as needed for wheezing or shortness of breath. 1 Inhaler 3  . ALPRAZolam (XANAX) 0.5 MG tablet Take 1 tablet (0.5 mg total) by mouth at bedtime as needed for anxiety. 30 tablet 0  . Aminocaproic Acid 1000 MG TABS TAKE 1 TABLET(1000 MG) BY MOUTH TWICE DAILY (Patient taking differently: Take 1,000 mg by mouth in the morning and at bedtime. TAKE 1 TABLET(1000 MG) BY MOUTH TWICE DAILY) 60 tablet 2  . amLODipine (NORVASC) 10 MG tablet TAKE 1 TABLET(10 MG) BY MOUTH DAILY (Patient taking differently: Take 10 mg by mouth daily. TAKE 1 TABLET(10 MG) BY MOUTH DAILY) 90 tablet 0  . atorvastatin (LIPITOR) 80 MG tablet TAKE 1 TABLET(80 MG) BY MOUTH DAILY (Patient taking differently: Take 80 mg by mouth daily. TAKE 1 TABLET(80 MG) BY MOUTH DAILY) 30 tablet 3  . cetirizine (ZYRTEC) 10 MG tablet TAKE 1 TABLET(10 MG) BY MOUTH DAILY 90 tablet 1  . diphenoxylate-atropine (LOMOTIL) 2.5-0.025 MG tablet 1 to 2 PO QID prn diarrhea (Patient taking differently: Take 1-2 tablets by mouth 4 (four) times daily as needed for diarrhea or loose stools. ) 30 tablet 1  . glucose blood  (ACCU-CHEK GUIDE) test strip Check blood sugar 2 times per day while on steroids before breakfast and dinner 50 each 3  . hydrochlorothiazide (HYDRODIURIL) 12.5 MG tablet TAKE 1 TABLET(12.5 MG) BY MOUTH DAILY (Patient taking differently: Take 12.5 mg by mouth daily. TAKE 1 TABLET(12.5 MG) BY MOUTH DAILY) 90 tablet 1  . lidocaine-prilocaine (EMLA) cream Apply 1 application topically as needed. 30 g 0  . metFORMIN (GLUCOPHAGE) 500 MG tablet Take 1 tablet (500 mg total) by mouth 2 (two) times daily with a meal. 180 tablet 3  . pantoprazole (PROTONIX) 40 MG tablet TAKE 1 TABLET(40 MG) BY MOUTH TWICE DAILY 60 tablet 5  . Podiatric Products (FLEXITOL HEEL BALM) OINT Apply 1 application topically as needed (dry skin).     . potassium chloride (KLOR-CON) 20 MEQ packet Take 20 mEq by mouth 2 (two) times daily. 30 packet 1  . prochlorperazine (COMPAZINE) 10 MG tablet Take 1 tablet (10 mg total) by mouth every 6 (six) hours as needed for nausea or vomiting. 30 tablet 1  . sertraline (ZOLOFT) 100 MG tablet TAKE 1 TABLET(100 MG) BY MOUTH DAILY (Patient taking differently: Take 100 mg by mouth daily. ) 90 tablet 1   No current facility-administered medications for this visit.    PHYSICAL EXAMINATION: ECOG PERFORMANCE STATUS: {CHL ONC ECOG [phone]}  There were no vitals filed for this visit. There were no vitals filed for this visit.  GENERAL:alert, no distress and comfortable SKIN: skin color, texture, turgor are normal, no rashes or significant lesions EYES: normal, Conjunctiva are pink and non-injected, sclera clear OROPHARYNX:no exudate, no erythema and lips, buccal mucosa, and tongue normal  NECK: supple, thyroid normal size,  non-tender, without nodularity LYMPH:  no palpable lymphadenopathy in the cervical, axillary or inguinal LUNGS: clear to auscultation and percussion with normal breathing effort HEART: regular rate & rhythm and no murmurs and no lower extremity edema ABDOMEN:abdomen soft,  non-tender and normal bowel sounds Musculoskeletal:no cyanosis of digits and no clubbing  NEURO: alert & oriented x 3 with fluent speech, no focal motor/sensory deficits  LABORATORY DATA:  I have reviewed the data as listed CBC Latest Ref Rng & Units 03/23/2020 03/21/2020 03/16/2020  WBC 4.0 - 10.5 K/uL 15.2(H) 12.1(H) 13.3(H)  Hemoglobin 12.0 - 15.0 g/dL 7.0(L) 6.7(LL) 7.3(L)  Hematocrit 36.0 - 46.0 % 23.2(L) 22.6(L) 25.4(L)  Platelets 150 - 400 K/uL 296 276 279     CMP Latest Ref Rng & Units 04/10/2020 03/23/2020 03/21/2020  Glucose 70 - 99 mg/dL 130(H) 148(H) 118(H)  BUN 6 - 20 mg/dL 10 12 8   Creatinine 0.44 - 1.00 mg/dL 0.75 0.84 0.81  Sodium 135 - 145 mmol/L 140 144 142  Potassium 3.5 - 5.1 mmol/L 3.3(L) 3.2(L) 3.3(L)  Chloride 98 - 111 mmol/L 106 108 107  CO2 22 - 32 mmol/L 25 22 25   Calcium 8.9 - 10.3 mg/dL 9.5 9.1 8.9  Total Protein 6.5 - 8.1 g/dL 7.0 6.7 6.9  Total Bilirubin 0.3 - 1.2 mg/dL <0.2(L) 0.2(L) 0.2(L)  Alkaline Phos 38 - 126 U/L 70 70 75  AST 15 - 41 U/L 18 13(L) 20  ALT 0 - 44 U/L 13 7 10       RADIOGRAPHIC STUDIES: I have personally reviewed the radiological images as listed and agreed with the findings in the report. No results found.   ASSESSMENT & PLAN:  No problem-specific Assessment & Plan notes found for this encounter.   No orders of the defined types were placed in this encounter.  All questions were answered. The patient knows to call the clinic with any problems, questions or concerns. No barriers to learning was detected. I spent {CHL ONC TIME VISIT - [id number] counseling the patient face to face. The total time spent in the appointment was {CHL ONC TIME VISIT - [id number] and more than 50% was on counseling and review of test results     Alla Feeling, NP 04/22/20

## 2020-04-24 ENCOUNTER — Other Ambulatory Visit: Payer: Self-pay | Admitting: Hematology

## 2020-04-24 ENCOUNTER — Ambulatory Visit: Payer: Medicaid Other | Admitting: Nurse Practitioner

## 2020-04-24 ENCOUNTER — Ambulatory Visit: Payer: Medicaid Other

## 2020-04-24 ENCOUNTER — Other Ambulatory Visit: Payer: Medicaid Other

## 2020-04-25 ENCOUNTER — Telehealth: Payer: Self-pay | Admitting: Hematology

## 2020-04-25 NOTE — Telephone Encounter (Signed)
Scheduled appt per 1/10 sch msg - pt is aware of new appt on 1/14 -

## 2020-04-26 NOTE — Progress Notes (Signed)
Peggy House   [phone]) [phone] [phone]   Clinic Follow up Note   Patient Care Team: [name], MD as PCP - General (Internal Medicine)  Date of Service:  04/28/2020  CHIEF COMPLAINT: F/uof HHT andAnemia   PREVIOUS THERAPY:  -Multiple EGD, small bowel enteroscope, C-scope with APC -Oral and iv amicar were given during hospital stay, no effect -IR embolization ofof the left gastric and short gastric artery branch vessels without evidence of residual flow in the Dieulafoy lesionon 12/10/17 -Avastin on 8/2 and 8/30 at [facility]; starting 12/26/17 continue 5mg /kg q2 weeksuntil 02/20/18, restarted on 04/03/2018 -Tamoxifen started in 10/2017 for HHTstopped 12/10/18 due toDVT  CURRENT THERAPY: -Blood transfusion for severe anemia secondary to GI bleedingas needed -ivferric gluconate every1-2 weeks as neededwith ferritin goal 100-200. Increased to weekly on 07/29/19.Reduced to every 2 weeks on 11/12/19 -RestartedAvastin/Zirabevq2weeks on 04/03/18. Reduced to every 4 weeks starting 09/25/19. Increased to every 2 weeks on 01/06/20. Increased dose to 250mg  on 01/20/20 -Amicar 1g bidstarting4/06/2018. Reduced to only as needed on 12/10/18 due toDVT  INTERVAL HISTORY:  Peggy House is here for a follow up of HHT and Anemia. She presents to the clinic alone. She notes having dark stool over the weekend. She has not had it since 4 days ago (lasted 3 days). She had 2 normal BM since. She also notes recent increased epistaxis. She has swelling of her LE, L>R. She notes there are sore.   She denies chest pain or SOB. She is fatigued.     REVIEW OF SYSTEMS:   Constitutional: Denies fevers, chills or abnormal weight loss (+) Fatigue  Eyes: Denies blurriness of vision Ears, nose, mouth, throat, and face: Denies mucositis or sore throat Respiratory: Denies cough, dyspnea or wheezes Cardiovascular: Denies palpitation, chest discomfort or lower  extremity swelling Gastrointestinal:  Denies nausea, heartburn or change in bowel habits Skin: Denies abnormal skin rashes Lymphatics: Denies new lymphadenopathy or easy bruising Neurological:Denies numbness, tingling or new weaknesses Behavioral/Psych: Mood is stable, no new changes  All other systems were reviewed with the patient and are negative.  MEDICAL HISTORY:  Past Medical History:  Diagnosis Date  . Anxiety   . Arthritis    knnes,back  . GERD (gastroesophageal reflux disease)   . 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 History of swelling of feet   . Hyperlipidemia   . Hypertension   . Major depressive disorder, recurrent episode ([place]) 06/05/2015  . Obesity   . Snores   . 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 ABDOMINAL HYSTERECTOMY    . CARPAL TUNNEL RELEASE  05/13/2011   Procedure: CARPAL TUNNEL RELEASE;  Surgeon: Nita Sells, MD;  Location: [place];  Service: Orthopedics;  Laterality: Left;  . COLONOSCOPY N/A 03/02/2020   Procedure: COLONOSCOPY;  Surgeon: Ronald Lobo, MD;  Location: [facility] ENDOSCOPY;  Service: Endoscopy;  Laterality: N/A;  . COLONOSCOPY WITH PROPOFOL N/A 04/28/2014   Procedure: COLONOSCOPY WITH PROPOFOL;  Surgeon: Cleotis Nipper, MD;  Location: [facility] ENDOSCOPY;  Service: Endoscopy;  Laterality: N/A;  . DG TOES*L*  2/10   rt  . DILATION AND CURETTAGE OF UTERUS    . ENTEROSCOPY N/A 10/17/2017   Procedure: ENTEROSCOPY;  Surgeon: Otis Brace, MD;  Location: [place] ENDOSCOPY;  Service: Gastroenterology;  Laterality: N/A;  . ESOPHAGOGASTRODUODENOSCOPY N/A 04/10/2014   Procedure: ESOPHAGOGASTRODUODENOSCOPY (EGD);  Surgeon: [name], MD;  Location: [facility] ENDOSCOPY;  Service: Endoscopy;  Laterality: N/A;  . ESOPHAGOGASTRODUODENOSCOPY N/A 05/10/2017   Procedure: ESOPHAGOGASTRODUODENOSCOPY (EGD);  Surgeon: Ronald Lobo, MD;  Location: [facility] ENDOSCOPY;  Service: Endoscopy;  Laterality: N/A;  . ESOPHAGOGASTRODUODENOSCOPY N/A 09/22/2017   Procedure: ESOPHAGOGASTRODUODENOSCOPY (EGD);  Surgeon: Clarene Essex, MD;  Location: [place];  Service: Endoscopy;  Laterality: N/A;  bedside  . ESOPHAGOGASTRODUODENOSCOPY N/A 03/02/2020   Procedure: ESOPHAGOGASTRODUODENOSCOPY (EGD);  Surgeon: Ronald Lobo, MD;  Location: [facility];  Service: Endoscopy;  Laterality: N/A;  . ESOPHAGOGASTRODUODENOSCOPY (EGD) WITH PROPOFOL N/A 04/27/2014   Procedure: ESOPHAGOGASTRODUODENOSCOPY (EGD) WITH PROPOFOL;  Surgeon: Cleotis Nipper, MD;  Location: [place];  Service: Endoscopy;  Laterality: N/A;  possible apc  . ESOPHAGOGASTRODUODENOSCOPY (EGD) WITH PROPOFOL N/A 09/30/2017   Procedure: ESOPHAGOGASTRODUODENOSCOPY (EGD) WITH PROPOFOL;  Surgeon: [name], MD;  Location: [place];  Service: Gastroenterology;  Laterality: N/A;  . ESOPHAGOGASTRODUODENOSCOPY (EGD) WITH PROPOFOL N/A 10/01/2017   Procedure: ESOPHAGOGASTRODUODENOSCOPY (EGD) WITH PROPOFOL;  Surgeon: [name], MD;  Location: [place];  Service: Gastroenterology;  Laterality: N/A;  . ESOPHAGOGASTRODUODENOSCOPY (EGD) WITH PROPOFOL N/A 10/08/2017   Procedure: ESOPHAGOGASTRODUODENOSCOPY (EGD) WITH PROPOFOL;  Surgeon: Otis Brace, MD;  Location: [facility]OS[facility];  Service: Gastroenterology;  Laterality: N/A;  . ESOPHAGOGASTRODUODENOSCOPY (EGD) WITH PROPOFOL N/A 10/17/2017   Procedure: ESOPHAGOGASTRODUODENOSCOPY (EGD) WITH PROPOFOL;  Surgeon: Otis Brace, MD;  Location: [facility] ENDOSCOPY;  Service: Gastroenterology;  Laterality: N/A;  . ESOPHAGOGASTRODUODENOSCOPY (EGD) WITH PROPOFOL N/A 10/19/2017   Procedure: ESOPHAGOGASTRODUODENOSCOPY (EGD) WITH PROPOFOL;  Surgeon: Otis Brace, MD;  Location: [facility]OSCOPY;  Service: Gastroenterology;  Laterality: N/A;  . ESOPHAGOGASTRODUODENOSCOPY (EGD) WITH PROPOFOL N/A 12/04/2018   Procedure: ESOPHAGOGASTRODUODENOSCOPY (EGD)  WITH PROPOFOL;  Surgeon: Wilford Corner, MD;  Location: [facility] ENDOS[facility];  Service: Endoscopy;  Laterality: N/A;  . GIVENS CAPSULE STUDY N/A 10/02/2017   Procedure: GIVENS CAPSULE STUDY;  Surgeon: [name], MD;  Location: [place];  Service: Gastroenterology;  Laterality: N/A;  . GIVENS CAPSULE STUDY N/A 10/08/2017   Procedure: GIVENS CAPSULE STUDY;  Surgeon: Otis Brace, MD;  Location: [place];  Service: Gastroenterology;  Laterality: N/A;  endoscopic placement of capsule  . GIVENS CAPSULE STUDY N/A 03/02/2020   Procedure: GIVENS CAPSULE STUDY;  Surgeon: Ronald Lobo, MD;  Location: [facility];  Service: Endoscopy;  Laterality: N/A;  . HEMOSTASIS CLIP PLACEMENT  12/04/2018   Procedure: HEMOSTASIS CLIP PLACEMENT;  Surgeon: Wilford Corner, MD;  Location: [facility];  Service: Endoscopy;;  . HOT HEMOSTASIS N/A 04/27/2014   Procedure: HOT HEMOSTASIS (ARGON PLASMA COAGULATION/BICAP);  Surgeon: Cleotis Nipper, MD;  Location: [facility] ENDOSCOPY;  Service: Endoscopy;  Laterality: N/A;  . HOT HEMOSTASIS N/A 09/30/2017   Procedure: HOT HEMOSTASIS (ARGON PLASMA COAGULATION/BICAP);  Surgeon: [name], MD;  Location: [place];  Service: Gastroenterology;  Laterality: N/A;  . HOT HEMOSTASIS N/A 10/01/2017   Procedure: HOT HEMOSTASIS (ARGON PLASMA COAGULATION/BICAP);  Surgeon: [name], MD;  Location: [place];  Service: Gastroenterology;  Laterality: N/A;  . HOT HEMOSTASIS N/A 10/17/2017   Procedure: HOT HEMOSTASIS (ARGON PLASMA COAGULATION/BICAP);  Surgeon: Otis Brace, MD;  Location: [facility] ENDOSCOPY;  Service: Gastroenterology;  Laterality: N/A;  . HOT HEMOSTASIS N/A 10/19/2017   Procedure: HOT HEMOSTASIS (ARGON PLASMA COAGULATION/BICAP);  Surgeon: Otis Brace, MD;  Location: [facility] ENDOSCOPY;  Service: Gastroenterology;  Laterality: N/A;  . HOT HEMOSTASIS N/A 03/02/2020   Procedure: HOT HEMOSTASIS (ARGON PLASMA COAGULATION/BICAP);  Surgeon: Ronald Lobo, MD;  Location: [facility];  Service: Endoscopy;  Laterality: N/A;  . IR IMAGING GUIDED PORT INSERTION  07/08/2018  . L shoulder Surgery  2011  . POLYPECTOMY  03/02/2020   Procedure: POLYPECTOMY;  Surgeon: Ronald Lobo, MD;  Location: [facility] ENDOSCOPY;  Service: Endoscopy;;  . SUBMUCOSAL INJECTION  09/22/2017   Procedure: SUBMUCOSAL INJECTION;  Surgeon: Clarene Essex, MD;  Location: [place];  Service: Endoscopy;;  . SUBMUCOSAL INJECTION  12/04/2018   Procedure: SUBMUCOSAL INJECTION;  Surgeon: Wilford Corner, MD;  Location: [facility] ENDOSCOPY;  Service: Endoscopy;;    I have reviewed the social history and family history with the patient and they are unchanged from previous note.  ALLERGIES:  is allergic to feraheme [ferumoxytol], nsaids, tomato, and wasp venom.  MEDICATIONS:  Current Outpatient Medications  Medication Sig Dispense Refill  . Accu-Chek Softclix Lancets lancets Use to check blood sugar before breakfast and before dinner while on steroids 100 each 1  . acitretin (SORIATANE) 10 MG capsule Take 10 mg by mouth daily.    [name] albuterol (PROVENTIL HFA;VENTOLIN HFA) 108 (90 Base) MCG/ACT inhaler Inhale 1-2 puffs into the lungs every 6 (six) hours as needed for wheezing or shortness of breath. 1 Inhaler 3  . ALPRAZolam (XANAX) 0.5 MG tablet Take 1 tablet (0.5 mg total) by mouth at bedtime as needed for anxiety. 30 tablet 0  . Aminocaproic Acid 1000 MG TABS TAKE 1 TABLET(1000 MG) BY MOUTH TWICE DAILY (Patient taking differently: Take 1,000 mg by mouth in the morning and at bedtime. TAKE 1 TABLET(1000 MG) BY MOUTH TWICE DAILY) 60 tablet 2  . amLODipine (NORVASC) 10 MG tablet TAKE 1 TABLET(10 MG) BY MOUTH DAILY (Patient taking differently: Take 10 mg by mouth daily. TAKE 1 TABLET(10 MG) BY MOUTH DAILY) 90 tablet 0  . atorvastatin (LIPITOR) 80 MG tablet TAKE 1 TABLET(80 MG) BY MOUTH DAILY (Patient taking differently: Take 80 mg by mouth daily. TAKE 1 TABLET(80 MG) BY MOUTH DAILY) 30 tablet 3  . cetirizine  (ZYRTEC) 10 MG tablet TAKE 1 TABLET(10 MG) BY MOUTH DAILY 90 tablet 1  . diphenoxylate-atropine (LOMOTIL) 2.5-0.025 MG tablet 1 to 2 PO QID prn diarrhea (Patient taking differently: Take 1-2 tablets by mouth 4 (four) times daily as needed for diarrhea or loose stools. ) 30 tablet 1  . glucose blood (ACCU-CHEK GUIDE) test strip Check blood sugar 2 times per day while on steroids before breakfast and dinner 50 each 3  . hydrochlorothiazide (HYDRODIURIL) 12.5 MG tablet TAKE 1 TABLET(12.5 MG) BY MOUTH DAILY (Patient taking differently: Take 12.5 mg by mouth daily. TAKE 1 TABLET(12.5 MG) BY MOUTH DAILY) 90 tablet 1  . lidocaine-prilocaine (EMLA) cream Apply 1 application topically as needed. 30 g 0  . metFORMIN (GLUCOPHAGE) 500 MG tablet Take 1 tablet (500 mg total) by mouth 2 (two) times daily with a meal. 180 tablet 3  . pantoprazole (PROTONIX) 40 MG tablet TAKE 1 TABLET(40 MG) BY MOUTH TWICE DAILY 60 tablet 5  . Podiatric Products (FLEXITOL HEEL BALM) OINT Apply 1 application topically as needed (dry skin).     . potassium chloride (KLOR-CON) 20 MEQ packet Take 20 mEq by mouth 2 (two) times daily. 30 packet 1  . prochlorperazine (COMPAZINE) 10 MG tablet Take 1 tablet (10 mg total) by mouth every 6 (six) hours as needed for nausea or vomiting. 30 tablet 1  . sertraline (ZOLOFT) 100 MG tablet TAKE 1 TABLET(100 MG) BY MOUTH DAILY (Patient taking differently: Take 100 mg by mouth daily. ) 90 tablet 1   No current facility-administered medications for this visit.    PHYSICAL EXAMINATION: ECOG PERFORMANCE  STATUS: 2 - Symptomatic, <50% confined to bed  Vitals:   04/28/20 1256  BP: 132/72  Pulse: 88  Resp: 18  Temp: 97.6 F (36.4 C)  SpO2: 100%   Filed Weights   04/28/20 1256  Weight: 253 lb 6.4 oz (114.9 kg)    Due to COVID19 we will limit examination to appearance. Patient had no complaints.  GENERAL:alert, no distress and comfortable SKIN: skin color normal, no rashes or significant  lesions EYES: normal, Conjunctiva are pink and non-injected, sclera clear  NEURO: alert & oriented x 3 with fluent speech (+) LE swelling, L>R  LABORATORY DATA:  I have reviewed the data as listed CBC Latest Ref Rng & Units 04/28/2020 03/23/2020 03/21/2020  WBC 4.0 - 10.5 K/uL 10.2 15.2(H) 12.1(H)  Hemoglobin 12.0 - 15.0 g/dL 5.1(LL) 7.0(L) 6.7(LL)  Hematocrit 36.0 - 46.0 % 18.2(L) 23.2(L) 22.6(L)  Platelets 150 - 400 K/uL 449(H) 296 276     CMP Latest Ref Rng & Units 04/10/2020 03/23/2020 03/21/2020  Glucose 70 - 99 mg/dL 130(H) 148(H) 118(H)  BUN 6 - 20 mg/dL 10 12 8   Creatinine 0.44 - 1.00 mg/dL 0.75 0.84 0.81  Sodium 135 - 145 mmol/L 140 144 142  Potassium 3.5 - 5.1 mmol/L 3.3(L) 3.2(L) 3.3(L)  Chloride 98 - 111 mmol/L 106 108 107  CO2 22 - 32 mmol/L 25 22 25   Calcium 8.9 - 10.3 mg/dL 9.5 9.1 8.9  Total Protein 6.5 - 8.1 g/dL 7.0 6.7 6.9  Total Bilirubin 0.3 - 1.2 mg/dL <0.2(L) 0.2(L) 0.2(L)  Alkaline Phos 38 - 126 U/L 70 70 75  AST 15 - 41 U/L 18 13(L) 20  ALT 0 - 44 U/L 13 7 10       RADIOGRAPHIC STUDIES: I have personally reviewed the radiological images as listed and agreed with the findings in the report. No results found.   ASSESSMENT & PLAN:  SCOTTLYN MCHANEY is a 61 y.o. female with    1. 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 Extensive workup found the pt to have HHT based on her personal and familyhistoryand recurrentAVM GI bleedings. -Sheis s/pIR embolization of the left Gastric and short gastric artery branchvesselswithout evidence of residual flow in the Dieulafoy lesionon 12/10/2017. -She has been on bevacizumab every 2-4 week, treatment has been intermittently held due to large proteinuria. She has been on IV Ferric Gluconate every 1-2 weeks (unless  Ferritin >200)and blood transfusions as needed.We repeatedly discussed consistency in treatments to manage her blood counts, she voices good understanding. -This past weekend she has had black stool and increased epistaxis recently. Labs today show, Hg 5.1, plt 449K, ANC 7.9, iron 18, sat ratio 7. Ferritin still pending but 48 2 weeks ago. Will proceed with IV Ferric Gluconate and 1-2 units of blood over today and tomorrow if needed.  -I strongly encouraged her to contact the my office when she is having ongoing black stool or epistaxis. -Will increase labs and blood transfusions as needed to weekly. Continue Beva (if urine protein <300) and Ferric Gluconate 250mg  (unless Ferritin >200) every 2 weeks.  -Due to his persistent severe anemia, will give normal IV iron and bevacizumab -F/u in 2 weeks.   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 She is also on blood transfusionsas needed.Last Blood transfusion was 03/21/2020. Continue prenatal vitamin.  -Given recent increase of black stool and Epistaxis, hg at 5.1 today (04/28/20). Will given 2 units of blood over today and tomorrow.    3. Reactive Leukocytosis  -She has mild leukocytosis with dominant neutrophils, likely reactive. Her prior thrombocytosis resolved.  -Mostly stable, did increase to 449K today (04/28/20). Given severe anemia today.   4. 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 She required cauterization for stomach for severe Gi bleeding in 11/2018 -Continue to follow up with her GI, Dr. Cristina Gong -I started heron Amicar 07/17/18. Given DVT she will only take  Amicar for severe epistaxis -She had recurrence in GI bleeding this weekend with black stool and mentioned increased epistaxis recently (04/28/20). I strongly encouraged her to contact my office when this occurs.   5.History of right Arm DVT -She had right arm DVT that extended to her right clavicleon 12/07/2018.  -She is not a candidate for anticoagulation due to severe GI bleeding. We stopped Tamoxifen, sheI son low doseAmicar for epistaxis. Will maintain [place] flushes.  6. Smoking cessation -She was smoking 10 cigarettes a day on average before recent hospitalization -I strongly encouraged her to stop smoking completely, she will try.  7. Hypokalemia -Onpotassium chloride 20 mEq daily, continue. Willmonitor  9. HTN, uncontrolled, Recently diagnosed DM -Continue medications. Will monitor on Avastin.  -Continue to f/u with PCP   10. Anxiety  -She has been stressed with family health issues (her husband's passing summer 2021 and son's DM on transplant list). She has been more anxious and started having panic attacks -She is currently on Xanax at 0.5mg  dose to use only as needed. F/u with SW as needed for counseling.   11. B/l LE Edema -Stable. Continue compression socks   PLAN:  -Proceed with Ferric Gluconate and bevacizumab today with and 2 units of blood today or tomorrow  -Lab and 3 hr blood transfusion in 1, 2, 3, 4 weeks on Thursday -Beva (if urine protein <300) and Ferric Gluconate 250mg  (unless Ferritin >200 on last lab) in 2 and 4 weeks  -F/u in 2 weeks    No problem-specific Assessment & Plan notes found for this encounter.   No orders of the defined types were placed in this encounter.  All questions were answered. The patient knows to call the clinic with any problems, questions or concerns. No barriers to learning was detected. The total time spent in the appointment was 30 minutes.     Truitt Merle, MD 04/28/2020   I, Joslyn Devon, am acting as  scribe for Truitt Merle, MD.   I have reviewed the above documentation for accuracy and completeness, and I agree with the above.

## 2020-04-28 ENCOUNTER — Other Ambulatory Visit: Payer: Self-pay | Admitting: *Deleted

## 2020-04-28 ENCOUNTER — Inpatient Hospital Stay: Payer: Medicaid Other

## 2020-04-28 ENCOUNTER — Inpatient Hospital Stay: Payer: Medicaid Other | Attending: Hematology

## 2020-04-28 ENCOUNTER — Other Ambulatory Visit: Payer: Self-pay

## 2020-04-28 ENCOUNTER — Telehealth: Payer: Self-pay

## 2020-04-28 ENCOUNTER — Inpatient Hospital Stay (HOSPITAL_BASED_OUTPATIENT_CLINIC_OR_DEPARTMENT_OTHER): Payer: Medicaid Other | Admitting: Hematology

## 2020-04-28 VITALS — BP 132/72 | HR 88 | Temp 97.6°F | Resp 18 | Ht 66.0 in | Wt 253.4 lb

## 2020-04-28 VITALS — BP 144/63 | HR 79 | Temp 99.2°F | Resp 18

## 2020-04-28 DIAGNOSIS — K922 Gastrointestinal hemorrhage, unspecified: Secondary | ICD-10-CM | POA: Insufficient documentation

## 2020-04-28 DIAGNOSIS — Z79899 Other long term (current) drug therapy: Secondary | ICD-10-CM | POA: Diagnosis not present

## 2020-04-28 DIAGNOSIS — D500 Iron deficiency anemia secondary to blood loss (chronic): Secondary | ICD-10-CM

## 2020-04-28 DIAGNOSIS — I780 Hereditary hemorrhagic telangiectasia: Secondary | ICD-10-CM | POA: Diagnosis not present

## 2020-04-28 DIAGNOSIS — R808 Other proteinuria: Secondary | ICD-10-CM

## 2020-04-28 DIAGNOSIS — Z95828 Presence of other vascular implants and grafts: Secondary | ICD-10-CM

## 2020-04-28 DIAGNOSIS — Z5112 Encounter for antineoplastic immunotherapy: Secondary | ICD-10-CM | POA: Diagnosis not present

## 2020-04-28 LAB — CBC WITH DIFFERENTIAL (CANCER CENTER ONLY)
Abs Immature Granulocytes: 0.04 10*3/uL (ref 0.00–0.07)
Basophils Absolute: 0 10*3/uL (ref 0.0–0.1)
Basophils Relative: 0 %
Eosinophils Absolute: 0.1 10*3/uL (ref 0.0–0.5)
Eosinophils Relative: 1 %
HCT: 18.2 % — ABNORMAL LOW (ref 36.0–46.0)
Hemoglobin: 5.1 g/dL — CL (ref 12.0–15.0)
Immature Granulocytes: 0 %
Lymphocytes Relative: 13 %
Lymphs Abs: 1.3 10*3/uL (ref 0.7–4.0)
MCH: 24.2 pg — ABNORMAL LOW (ref 26.0–34.0)
MCHC: 28 g/dL — ABNORMAL LOW (ref 30.0–36.0)
MCV: 86.3 fL (ref 80.0–100.0)
Monocytes Absolute: 0.8 10*3/uL (ref 0.1–1.0)
Monocytes Relative: 7 %
Neutro Abs: 7.9 10*3/uL — ABNORMAL HIGH (ref 1.7–7.7)
Neutrophils Relative %: 79 %
Platelet Count: 449 10*3/uL — ABNORMAL HIGH (ref 150–400)
RBC: 2.11 MIL/uL — ABNORMAL LOW (ref 3.87–5.11)
RDW: 21.9 % — ABNORMAL HIGH (ref 11.5–15.5)
WBC Count: 10.2 10*3/uL (ref 4.0–10.5)
nRBC: 0.3 % — ABNORMAL HIGH (ref 0.0–0.2)

## 2020-04-28 LAB — SAMPLE TO BLOOD BANK

## 2020-04-28 LAB — IRON AND TIBC
Iron: 18 ug/dL — ABNORMAL LOW (ref 41–142)
Saturation Ratios: 7 % — ABNORMAL LOW (ref 21–57)
TIBC: 274 ug/dL (ref 236–444)
UIBC: 256 ug/dL (ref 120–384)

## 2020-04-28 LAB — FERRITIN: Ferritin: 38 ng/mL (ref 11–307)

## 2020-04-28 LAB — PREPARE RBC (CROSSMATCH)

## 2020-04-28 LAB — TOTAL PROTEIN, URINE DIPSTICK: Protein, ur: 100 mg/dL — AB

## 2020-04-28 MED ORDER — SODIUM CHLORIDE 0.9 % IV SOLN
5.0000 mg/kg | Freq: Once | INTRAVENOUS | Status: AC
  Administered 2020-04-28: 600 mg via INTRAVENOUS
  Filled 2020-04-28: qty 8

## 2020-04-28 MED ORDER — SODIUM CHLORIDE 0.9% FLUSH
10.0000 mL | Freq: Once | INTRAVENOUS | Status: AC
  Administered 2020-04-28: 10 mL
  Filled 2020-04-28: qty 10

## 2020-04-28 MED ORDER — SODIUM CHLORIDE 0.9 % IV SOLN
Freq: Once | INTRAVENOUS | Status: AC
  Filled 2020-04-28: qty 250

## 2020-04-28 MED ORDER — SODIUM CHLORIDE 0.9 % IV SOLN
250.0000 mg | Freq: Once | INTRAVENOUS | Status: AC
  Administered 2020-04-28: 250 mg via INTRAVENOUS
  Filled 2020-04-28: qty 20

## 2020-04-28 MED ORDER — HEPARIN SOD (PORK) LOCK FLUSH 100 UNIT/ML IV SOLN
500.0000 [IU] | Freq: Once | INTRAVENOUS | Status: AC
Start: 2020-04-28 — End: 2020-04-28
  Administered 2020-04-28: 500 [IU]
  Filled 2020-04-28: qty 5

## 2020-04-28 MED ORDER — SODIUM CHLORIDE 0.9 % IV SOLN
INTRAVENOUS | Status: DC
  Filled 2020-04-28: qty 250

## 2020-04-28 NOTE — Patient Instructions (Signed)
[facility] Discharge Instructions for Patients Receiving   Today you received the following agents Avastin  To help prevent nausea and vomiting after your treatment, we encourage you to take your nausea medication as directed   If you develop nausea and vomiting that is not controlled by your nausea medication, call the clinic.   BELOW ARE SYMPTOMS THAT SHOULD BE REPORTED IMMEDIATELY:  *FEVER GREATER THAN 100.5 F  *CHILLS WITH OR WITHOUT FEVER  NAUSEA AND VOMITING THAT IS NOT CONTROLLED WITH YOUR NAUSEA MEDICATION  *UNUSUAL SHORTNESS OF BREATH  *UNUSUAL BRUISING OR BLEEDING  TENDERNESS IN MOUTH AND THROAT WITH OR WITHOUT PRESENCE OF ULCERS  *URINARY PROBLEMS  *BOWEL PROBLEMS  UNUSUAL RASH Items with * indicate a potential emergency and should be followed up as soon as possible.  Feel free to call the clinic should you have any questions or concerns. The clinic phone number is [phone].  Please show the [facility] at check-in to the Emergency Department and triage nurse.   Sodium Ferric Gluconate Complex injection What is this medicine? SODIUM FERRIC GLUCONATE COMPLEX (SOE dee um FER ik GLOO koe nate KOM pleks) is an iron replacement. It is used with epoetin therapy to treat low iron levels in patients who are receiving hemodialysis. This medicine may be used for other purposes; ask your health care provider or pharmacist if you have questions. COMMON BRAND NAME(S): Ferrlecit, Nulecit What should I tell my health care provider before I take this medicine? They need to know if you have any of the following conditions:  anemia that is not from iron deficiency  high levels of iron in the body  an unusual or allergic reaction to iron, benzyl alcohol, other medicines, foods, dyes, or preservatives  pregnant or are trying to become pregnant  breast-feeding How should I use this medicine? This medicine is for infusion into a vein. It is given  by a health care professional in a hospital or clinic setting. Talk to your pediatrician regarding the use of this medicine in children. While this drug may be prescribed for children as young as 20 years old for selected conditions, precautions do apply. Overdosage: If you think you have taken too much of this medicine contact a poison control center or emergency room at once. NOTE: This medicine is only for you. Do not share this medicine with others. What if I miss a dose? It is important not to miss your dose. Call your doctor or health care professional if you are unable to keep an appointment. What may interact with this medicine? Do not take this medicine with any of the following medications:  deferoxamine  dimercaprol  other iron products This medicine may also interact with the following medications:  chloramphenicol  deferasirox  medicine for blood pressure like enalapril This list may not describe all possible interactions. Give your health care provider a list of all the medicines, herbs, non-prescription drugs, or dietary supplements you use. Also tell them if you smoke, drink alcohol, or use illegal drugs. Some items may interact with your medicine. What should I watch for while using this medicine? Your condition will be monitored carefully while you are receiving this medicine. Visit your doctor for check-ups as directed. What side effects may I notice from receiving this medicine? Side effects that you should report to your doctor or health care professional as soon as possible:  allergic reactions like skin rash, itching or hives, swelling of the face, lips, or tongue  breathing problems  changes in hearing  changes in vision  chills, flushing, or sweating  fast, irregular heartbeat  feeling faint or lightheaded, falls  fever, flu-like symptoms  high or low blood pressure  pain, tingling, numbness in the hands or feet  severe pain in the chest, back,  flanks, or groin  swelling of the ankles, feet, hands  trouble passing urine or change in the amount of urine  unusually weak or tired Side effects that usually do not require medical attention (report to your doctor or health care professional if they continue or are bothersome):  cramps  dark colored stools  diarrhea  headache  nausea, vomiting  stomach upset This list may not describe all possible side effects. Call your doctor for medical advice about side effects. You may report side effects to FDA at 1-800-FDA-1088. Where should I keep my medicine? This drug is given in a hospital or clinic and will not be stored at home. NOTE: This sheet is a summary. It may not cover all possible information. If you have questions about this medicine, talk to your doctor, pharmacist, or health care provider.  2020 Elsevier/Gold Standard (2007-12-02 15:58:57)   Bevacizumab injection What is this medicine? BEVACIZUMAB (be va SIZ yoo mab) is a monoclonal antibody. It is used to treat many types of cancer. This medicine may be used for other purposes; ask your health care provider or pharmacist if you have questions. COMMON BRAND NAME(S): Avastin, MVASI, Zirabev What should I tell my health care provider before I take this medicine? They need to know if you have any of these conditions:  diabetes  heart disease  high blood pressure  history of coughing up blood  prior anthracycline chemotherapy (e.g., doxorubicin, daunorubicin, epirubicin)  recent or ongoing radiation therapy  recent or planning to have surgery  stroke  an unusual or allergic reaction to bevacizumab, hamster proteins, mouse proteins, other medicines, foods, dyes, or preservatives  pregnant or trying to get pregnant  breast-feeding How should I use this medicine? This medicine is for infusion into a vein. It is given by a health care professional in a hospital or clinic setting. Talk to your pediatrician  regarding the use of this medicine in children. Special care may be needed. Overdosage: If you think you have taken too much of this medicine contact a poison control center or emergency room at once. NOTE: This medicine is only for you. Do not share this medicine with others. What if I miss a dose? It is important not to miss your dose. Call your doctor or health care professional if you are unable to keep an appointment. What may interact with this medicine? Interactions are not expected. This list may not describe all possible interactions. Give your health care provider a list of all the medicines, herbs, non-prescription drugs, or dietary supplements you use. Also tell them if you smoke, drink alcohol, or use illegal drugs. Some items may interact with your medicine. What should I watch for while using this medicine? Your condition will be monitored carefully while you are receiving this medicine. You will need important blood work and urine testing done while you are taking this medicine. This medicine may increase your risk to bruise or bleed. Call your doctor or health care professional if you notice any unusual bleeding. Before having surgery, talk to your health care provider to make sure it is ok. This drug can increase the risk of poor healing of your surgical site or wound. You  will need to stop this drug for 28 days before surgery. After surgery, wait at least 28 days before restarting this drug. Make sure the surgical site or wound is healed enough before restarting this drug. Talk to your health care provider if questions. Do not become pregnant while taking this medicine or for 6 months after stopping it. Women should inform their doctor if they wish to become pregnant or think they might be pregnant. There is a potential for serious side effects to an unborn child. Talk to your health care professional or pharmacist for more information. Do not breast-feed an infant while taking this  medicine and for 6 months after the last dose. This medicine has caused ovarian failure in some women. This medicine may interfere with the ability to have a child. You should talk to your doctor or health care professional if you are concerned about your fertility. What side effects may I notice from receiving this medicine? Side effects that you should report to your doctor or health care professional as soon as possible:  allergic reactions like skin rash, itching or hives, swelling of the face, lips, or tongue  chest pain or chest tightness  chills  coughing up blood  high fever  seizures  severe constipation  signs and symptoms of bleeding such as bloody or black, tarry stools; red or dark-brown urine; spitting up blood or brown material that looks like coffee grounds; red spots on the skin; unusual bruising or bleeding from the eye, gums, or nose  signs and symptoms of a blood clot such as breathing problems; chest pain; severe, sudden headache; pain, swelling, warmth in the leg  signs and symptoms of a stroke like changes in vision; confusion; trouble speaking or understanding; severe headaches; sudden numbness or weakness of the face, arm or leg; trouble walking; dizziness; loss of balance or coordination  stomach pain  sweating  swelling of legs or ankles  vomiting  weight gain Side effects that usually do not require medical attention (report to your doctor or health care professional if they continue or are bothersome):  back pain  changes in taste  decreased appetite  dry skin  nausea  tiredness This list may not describe all possible side effects. Call your doctor for medical advice about side effects. You may report side effects to FDA at 1-800-FDA-1088. Where should I keep my medicine? This drug is given in a hospital or clinic and will not be stored at home. NOTE: This sheet is a summary. It may not cover all possible information. If you have  questions about this medicine, talk to your doctor, pharmacist, or health care provider.  2020 Elsevier/Gold Standard (2019-01-27 10:50:46)   Blood Transfusion, Adult, Care After This sheet gives you information about how to care for yourself after your procedure. Your doctor may also give you more specific instructions. If you have problems or questions, contact your doctor. What can I expect after the procedure? After the procedure, it is common to have:  Bruising and soreness at the IV site.  A fever or chills on the day of the procedure. This may be your body's response to the new blood cells received.  A headache. Follow these instructions at home: Insertion site care      Follow instructions from your doctor about how to take care of your insertion site. This is where an IV tube was put into your vein. Make sure you: ? Wash your hands with soap and water before and after  you change your bandage (dressing). If you cannot use soap and water, use hand sanitizer. ? Change your bandage as told by your doctor.  Check your insertion site every day for signs of infection. Check for: ? Redness, swelling, or pain. ? Bleeding from the site. ? Warmth. ? Pus or a bad smell. General instructions  Take over-the-counter and prescription medicines only as told by your doctor.  Rest as told by your doctor.  Go back to your normal activities as told by your doctor.  Keep all follow-up visits as told by your doctor. This is important. Contact a doctor if:  You have itching or red, swollen areas of skin (hives).  You feel worried or nervous (anxious).  You feel weak after doing your normal activities.  You have redness, swelling, warmth, or pain around the insertion site.  You have blood coming from the insertion site, and the blood does not stop with pressure.  You have pus or a bad smell coming from the insertion site. Get help right away if:  You have signs of a serious  reaction. This may be coming from an allergy or the body's defense system (immune system). Signs include: ? Trouble breathing or shortness of breath. ? Swelling of the face or feeling warm (flushed). ? Fever or chills. ? Head, chest, or back pain. ? Dark pee (urine) or blood in the pee. ? Widespread rash. ? Fast heartbeat. ? Feeling dizzy or light-headed. You may receive your blood transfusion in an outpatient setting. If so, you will be told whom to contact to report any reactions. These symptoms may be an emergency. Do not wait to see if the symptoms will go away. Get medical help right away. Call your local emergency services (911 in the [place] Do not drive yourself to the hospital. Summary  Bruising and soreness at the IV site are common.  Check your insertion site every day for signs of infection.  Rest as told by your doctor. Go back to your normal activities as told by your doctor.  Get help right away if you have signs of a serious reaction. This information is not intended to replace advice given to you by your health care provider. Make sure you discuss any questions you have with your health care provider. Document Revised: 09/24/2018 Document Reviewed: 09/24/2018 Elsevier Patient Education  [place].

## 2020-04-28 NOTE — Progress Notes (Signed)
Proceed with Avastin today per Dr Burr Medico. Noted protein 100.

## 2020-04-28 NOTE — Patient Instructions (Signed)

## 2020-04-28 NOTE — Telephone Encounter (Signed)
CRITICAL VALUE STICKER  CRITICAL VALUE: Hgb = 50.1  RECEIVER (on-site recipient of call): Yetta Glassman, [place] NOTIFIED: 04/28/20 at 12:52pm  MESSENGER (representative from lab): Ulice Dash  MD NOTIFIED: Cira Rue, NP  TIME OF NOTIFICATION: 04/28/2020 at 12:55pm  RESPONSE: Notification given to [place], RN for follow-up with provider.

## 2020-04-28 NOTE — Addendum Note (Signed)
Addended by: Ishmael Holter on: 04/28/2020 04:14 PM   Modules accepted: Orders

## 2020-04-29 ENCOUNTER — Encounter: Payer: Self-pay | Admitting: Hematology

## 2020-04-29 ENCOUNTER — Inpatient Hospital Stay: Payer: Medicaid Other

## 2020-04-29 DIAGNOSIS — D649 Anemia, unspecified: Secondary | ICD-10-CM

## 2020-04-29 DIAGNOSIS — D500 Iron deficiency anemia secondary to blood loss (chronic): Secondary | ICD-10-CM

## 2020-04-29 DIAGNOSIS — Z5112 Encounter for antineoplastic immunotherapy: Secondary | ICD-10-CM | POA: Diagnosis not present

## 2020-04-29 MED ORDER — SODIUM CHLORIDE 0.9% FLUSH
10.0000 mL | INTRAVENOUS | Status: AC | PRN
  Administered 2020-04-29: 10 mL
  Filled 2020-04-29: qty 10

## 2020-04-29 MED ORDER — ACETAMINOPHEN 325 MG PO TABS
650.0000 mg | ORAL_TABLET | Freq: Once | ORAL | Status: AC
  Administered 2020-04-29: 650 mg via ORAL

## 2020-04-29 MED ORDER — SODIUM CHLORIDE 0.9% IV SOLUTION
250.0000 mL | Freq: Once | INTRAVENOUS | Status: AC
  Administered 2020-04-29: 250 mL via INTRAVENOUS
  Filled 2020-04-29: qty 250

## 2020-04-29 MED ORDER — HEPARIN SOD (PORK) LOCK FLUSH 100 UNIT/ML IV SOLN
500.0000 [IU] | Freq: Every day | INTRAVENOUS | Status: DC | PRN
  Filled 2020-04-29: qty 5

## 2020-04-29 MED ORDER — ACETAMINOPHEN 325 MG PO TABS
ORAL_TABLET | ORAL | Status: AC
  Filled 2020-04-29: qty 2

## 2020-04-29 MED ORDER — DIPHENHYDRAMINE HCL 25 MG PO CAPS
25.0000 mg | ORAL_CAPSULE | Freq: Once | ORAL | Status: AC
  Administered 2020-04-29: 25 mg via ORAL

## 2020-04-29 MED ORDER — SODIUM CHLORIDE 0.9% FLUSH
10.0000 mL | INTRAVENOUS | Status: DC | PRN
  Filled 2020-04-29: qty 10

## 2020-04-29 MED ORDER — HEPARIN SOD (PORK) LOCK FLUSH 100 UNIT/ML IV SOLN
500.0000 [IU] | Freq: Every day | INTRAVENOUS | Status: AC | PRN
  Administered 2020-04-29: 500 [IU]
  Filled 2020-04-29: qty 5

## 2020-04-29 MED ORDER — DIPHENHYDRAMINE HCL 25 MG PO CAPS
ORAL_CAPSULE | ORAL | Status: AC
  Filled 2020-04-29: qty 1

## 2020-04-29 NOTE — Patient Instructions (Signed)

## 2020-04-30 LAB — BPAM RBC
Blood Product Expiration Date: 202201202359
Blood Product Expiration Date: 202201202359
ISSUE DATE / TIME: 202201150855
ISSUE DATE / TIME: 202201150855
Unit Type and Rh: 600
Unit Type and Rh: 600

## 2020-04-30 LAB — TYPE AND SCREEN
ABO/RH(D): A NEG
Antibody Screen: NEGATIVE
Unit division: 0
Unit division: 0

## 2020-05-03 ENCOUNTER — Telehealth: Payer: Self-pay | Admitting: Hematology

## 2020-05-03 NOTE — Telephone Encounter (Signed)
Scheduled appts per 1/14 los. Left voicemail with appt date and time.

## 2020-05-05 ENCOUNTER — Ambulatory Visit: Payer: Medicaid Other

## 2020-05-05 ENCOUNTER — Other Ambulatory Visit: Payer: Medicaid Other

## 2020-05-11 ENCOUNTER — Telehealth: Payer: Self-pay

## 2020-05-11 NOTE — Progress Notes (Signed)
[facility] OFFICE PROGRESS NOTE  [name], MD [address] [place] [address]  DIAGNOSIS: F/uof HHT andAnemia  PRIOR THERAPY: -Multiple EGD, small bowel enteroscope, C-scope with APC -Oral and iv amicar were given during hospital stay, no effect -IR embolization ofof the left gastric and short gastric artery branch vessels without evidence of residual flow in the Dieulafoy lesionon 12/10/17 -Avastin on 8/2 and 8/30 at [facility]; starting 12/26/17 continue 5mg /kg q2 weeksuntil 02/20/18, restarted on 04/03/2018 -Tamoxifen started in 10/2017 for HHTstopped 12/10/18 due toDVT  CURRENT THERAPY: -Blood transfusion for severe anemia secondary to GI bleedingas needed -ivferric gluconate every1-2 weeks as neededwith ferritin goal 100-200. Increased to weekly on 07/29/19.Reduced to every 2 weeks on 11/12/19 -RestartedAvastin/Zirabevq2weeks on 04/03/18. Reduced to every 4 weeks starting 09/25/19. Increased to every 2 weeks on 01/06/20. Increased dose to 250mg  on 01/20/20 -Amicar 1g bidstarting4/06/2018. Reduced to only as needed on 12/10/18 due toDVT  INTERVAL HISTORY: CHEZNEY ALTEMUS 60 y.o. female returns to the clinic today for a follow up visit for her HHT and Anemia. She was last seen in the clinic by Dr. Burr Medico on 04/28/20. At that visit, she had been endorsing dark stool and increased epistaxis. Her Hbg was 5.1. She received 2 units of blood. She also received avastin and ferric gluconate. Dr. Burr Medico increased her lab and 3 hour blood transfusion appointments to weekly. The patient did not show up to her appointment last week on 05/05/20 due to the weather.   In the interval since her last appointment, she states she had dark stool from 1/16-1/26/22. It is unclear from the history how many total dark bowel movements she had during that interval. She is unable to quantify. She did not call the office to alert [place]. She also reported an episode of epistaxis on  04/30/20. She also reports she felt nauseated and had hematemesis. It is unclear if the blood was truly from her stomach or post nasal bleeding from her nose bleeding; however, the patient states that the blood was bright red. She thinks the hematemesis happened before the epistaxis that day. She denies abdominal pain or anymore nausea/vomiting. When she gets nose bleeds, they last approximately 1 hour.  She reportedly is not taking a prenatal vitamin. She denies chest pain, shortness of breath, or light headedness. She does report significant fatigue.   The patient also is endorsing left lower extremity swelling/pain. She states this has been occurring since August 2021. She denies recent falls or injuries to this leg. Of note, the patient has a history of DVT but is not a candidate for anti-coagulation due to her HHT/GI bleeding/anemia. Her last doppler was in June 2021 which was negative for DVT.    MEDICAL HISTORY: Past Medical History:  Diagnosis Date  . Anxiety   . Arthritis    knnes,back  . GERD (gastroesophageal reflux disease)   . 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 History of swelling of feet   . Hyperlipidemia   . Hypertension   . Major depressive disorder, recurrent episode ([place]) 06/05/2015  . Obesity   . Snores   . 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  MEDICATIONS:  Current Outpatient Medications  Medication Sig Dispense Refill  . Accu-Chek Softclix Lancets lancets Use to check blood sugar before breakfast and before dinner while on steroids 100 each 1  . acitretin (SORIATANE) 10 MG  capsule Take 10 mg by mouth daily.    . albuterol (PROVENTIL HFA;VENTOLIN HFA) 108 (90[name]) MCG/ACT inhaler Inhale 1-2 puffs into the lungs every 6 (six) hours as needed for wheezing or shortness of breath. 1 Inhaler 3  . ALPRAZolam (XANAX) 0.5 MG tablet Take 1 tablet (0.5  mg total) by mouth at bedtime as needed for anxiety. 30 tablet 0  . Aminocaproic Acid 1000 MG TABS TAKE 1 TABLET(1000 MG) BY MOUTH TWICE DAILY (Patient taking differently: Take 1,000 mg by mouth in the morning and at bedtime. TAKE 1 TABLET(1000 MG) BY MOUTH TWICE DAILY) 60 tablet 2  . amLODipine (NORVASC) 10 MG tablet TAKE 1 TABLET(10 MG) BY MOUTH DAILY (Patient taking differently: Take 10 mg by mouth daily. TAKE 1 TABLET(10 MG) BY MOUTH DAILY) 90 tablet 0  . atorvastatin (LIPITOR) 80 MG tablet TAKE 1 TABLET(80 MG) BY MOUTH DAILY (Patient taking differently: Take 80 mg by mouth daily. TAKE 1 TABLET(80 MG) BY MOUTH DAILY) 30 tablet 3  . cetirizine (ZYRTEC) 10 MG tablet TAKE 1 TABLET(10 MG) BY MOUTH DAILY 90 tablet 1  . diphenoxylate-atropine (LOMOTIL) 2.5-0.025 MG tablet 1 to 2 PO QID prn diarrhea (Patient taking differently: Take 1-2 tablets by mouth 4 (four) times daily as needed for diarrhea or loose stools.) 30 tablet 1  . glucose blood (ACCU-CHEK GUIDE) test strip Check blood sugar 2 times per day while on steroids before breakfast and dinner 50 each 3  . hydrochlorothiazide (HYDRODIURIL) 12.5 MG tablet TAKE 1 TABLET(12.5 MG) BY MOUTH DAILY (Patient taking differently: Take 12.5 mg by mouth daily. TAKE 1 TABLET(12.5 MG) BY MOUTH DAILY) 90 tablet 1  . lidocaine-prilocaine (EMLA) cream Apply 1 application topically as needed. 30 g 0  . metFORMIN (GLUCOPHAGE) 500 MG tablet Take 1 tablet (500 mg total) by mouth 2 (two) times daily with a meal. 180 tablet 3  . pantoprazole (PROTONIX) 40 MG tablet TAKE 1 TABLET(40 MG) BY MOUTH TWICE DAILY 60 tablet 5  . Podiatric Products (FLEXITOL HEEL BALM) OINT Apply 1 application topically as needed (dry skin).     . potassium chloride (KLOR-CON) 20 MEQ packet Take 20 mEq by mouth 2 (two) times daily. 30 packet 1  . prochlorperazine (COMPAZINE) 10 MG tablet Take 1 tablet (10 mg total) by mouth every 6 (six) hours as needed for nausea or vomiting. 30 tablet 1  .  sertraline (ZOLOFT) 100 MG tablet TAKE 1 TABLET(100 MG) BY MOUTH DAILY (Patient taking differently: Take 100 mg by mouth daily.) 90 tablet 1   No current facility-administered medications for this visit.   Facility-Administered Medications Ordered in Other Visits  Medication Dose Route Frequency Provider Last Rate Last Admin  . bevacizumab-bvzr (ZIRABEV) 600 mg in sodium chloride 0.9 % 100 mL chemo infusion  5 mg/kg (Treatment Plan Recorded) Intravenous Once Malachy MoodFeng, Yan, MD      . ferric gluconate (NULECIT) 250 mg in sodium chloride 0.9 % 250 mL IVPB  250 mg Intravenous Once Malachy MoodFeng, Yan, MD 135 mL/hr at 05/12/20 1246 250 mg at 05/12/20 1246  . heparin lock flush 100 unit/mL  500 Units Intracatheter Once PRN Malachy MoodFeng, Yan, MD      . sodium chloride flush (NS) 0.9 % injection 10 mL  10 mL Intracatheter PRN Malachy MoodFeng, Yan, MD        SURGICAL HISTORY:  Past Surgical History:  Procedure Laterality Date  . ABDOMINAL HYSTERECTOMY    . CARPAL TUNNEL RELEASE  05/13/2011   Procedure: CARPAL TUNNEL RELEASE;  Surgeon: Nita Sells, MD;  Location: [facility];  Service: Orthopedics;  Laterality: Left;  . COLONOSCOPY N/A 03/02/2020   Procedure: COLONOSCOPY;  Surgeon: Ronald Lobo, MD;  Location: [facility] ENDOSCOPY;  Service: Endoscopy;  Laterality: N/A;  . COLONOSCOPY WITH PROPOFOL N/A 04/28/2014   Procedure: COLONOSCOPY WITH PROPOFOL;  Surgeon: Cleotis Nipper, MD;  Location: [facility] ENDOSCOPY;  Service: Endoscopy;  Laterality: N/A;  . DG TOES*L*  2/10   rt  . DILATION AND CURETTAGE OF UTERUS    . ENTEROSCOPY N/A 10/17/2017   Procedure: ENTEROSCOPY;  Surgeon: Otis Brace, MD;  Location: [facility] ENDOSCOPY;  Service: Gastroenterology;  Laterality: N/A;  . ESOPHAGOGASTRODUODENOSCOPY N/A 04/10/2014   Procedure: ESOPHAGOGASTRODUODENOSCOPY (EGD);  Surgeon: [name], MD;  Location: [facility] ENDOSCOPY;  Service: Endoscopy;  Laterality: N/A;  . ESOPHAGOGASTRODUODENOSCOPY N/A 05/10/2017   Procedure:  ESOPHAGOGASTRODUODENOSCOPY (EGD);  Surgeon: Ronald Lobo, MD;  Location: [facility] ENDOSCOPY;  Service: Endoscopy;  Laterality: N/A;  . ESOPHAGOGASTRODUODENOSCOPY N/A 09/22/2017   Procedure: ESOPHAGOGASTRODUODENOSCOPY (EGD);  Surgeon: Clarene Essex, MD;  Location: [facility];  Service: Endoscopy;  Laterality: N/A;  bedside  . ESOPHAGOGASTRODUODENOSCOPY N/A 03/02/2020   Procedure: ESOPHAGOGASTRODUODENOSCOPY (EGD);  Surgeon: Ronald Lobo, MD;  Location: [facility];  Service: Endoscopy;  Laterality: N/A;  . ESOPHAGOGASTRODUODENOSCOPY (EGD) WITH PROPOFOL N/A 04/27/2014   Procedure: ESOPHAGOGASTRODUODENOSCOPY (EGD) WITH PROPOFOL;  Surgeon: Cleotis Nipper, MD;  Location: [facility];  Service: Endoscopy;  Laterality: N/A;  possible apc  . ESOPHAGOGASTRODUODENOSCOPY (EGD) WITH PROPOFOL N/A 09/30/2017   Procedure: ESOPHAGOGASTRODUODENOSCOPY (EGD) WITH PROPOFOL;  Surgeon: [name], MD;  Location: [place];  Service: Gastroenterology;  Laterality: N/A;  . ESOPHAGOGASTRODUODENOSCOPY (EGD) WITH PROPOFOL N/A 10/01/2017   Procedure: ESOPHAGOGASTRODUODENOSCOPY (EGD) WITH PROPOFOL;  Surgeon: [name], MD;  Location: [place];  Service: Gastroenterology;  Laterality: N/A;  . ESOPHAGOGASTRODUODENOSCOPY (EGD) WITH PROPOFOL N/A 10/08/2017   Procedure: ESOPHAGOGASTRODUODENOSCOPY (EGD) WITH PROPOFOL;  Surgeon: Otis Brace, MD;  Location: [facility] ENDOSCOPY;  Service: Gastroenterology;  Laterality: N/A;  . ESOPHAGOGASTRODUODENOSCOPY (EGD) WITH PROPOFOL N/A 10/17/2017   Procedure: ESOPHAGOGASTRODUODENOSCOPY (EGD) WITH PROPOFOL;  Surgeon: Otis Brace, MD;  Location: [facility] ENDOSCOPY;  Service: Gastroenterology;  Laterality: N/A;  . ESOPHAGOGASTRODUODENOSCOPY (EGD) WITH PROPOFOL N/A 10/19/2017   Procedure: ESOPHAGOGASTRODUODENOSCOPY (EGD) WITH PROPOFOL;  Surgeon: Otis Brace, MD;  Location: [facility] ENDOSCOPY;  Service: Gastroenterology;  Laterality: N/A;  . ESOPHAGOGASTRODUODENOSCOPY (EGD) WITH PROPOFOL N/A  12/04/2018   Procedure: ESOPHAGOGASTRODUODENOSCOPY (EGD) WITH PROPOFOL;  Surgeon: Wilford Corner, MD;  Location: [facility];  Service: Endoscopy;  Laterality: N/A;  . GIVENS CAPSULE STUDY N/A 10/02/2017   Procedure: GIVENS CAPSULE STUDY;  Surgeon: [name], MD;  Location: [place];  Service: Gastroenterology;  Laterality: N/A;  . GIVENS CAPSULE STUDY N/A 10/08/2017   Procedure: GIVENS CAPSULE STUDY;  Surgeon: Otis Brace, MD;  Location: [facility];  Service: Gastroenterology;  Laterality: N/A;  endoscopic placement of capsule  . GIVENS CAPSULE STUDY N/A 03/02/2020   Procedure: GIVENS CAPSULE STUDY;  Surgeon: Ronald Lobo, MD;  Location: [facility];  Service: Endoscopy;  Laterality: N/A;  . HEMOSTASIS CLIP PLACEMENT  12/04/2018   Procedure: HEMOSTASIS CLIP PLACEMENT;  Surgeon: Wilford Corner, MD;  Location: [facility];  Service: Endoscopy;;  . HOT HEMOSTASIS N/A 04/27/2014   Procedure: HOT HEMOSTASIS (ARGON PLASMA COAGULATION/BICAP);  Surgeon: Cleotis Nipper, MD;  Location: [facility] ENDOSCOPY;  Service: Endoscopy;  Laterality: N/A;  . HOT HEMOSTASIS N/A 09/30/2017   Procedure: HOT HEMOSTASIS (ARGON PLASMA COAGULATION/BICAP);  Surgeon: [name], MD;  Location: [place];  Service:  Gastroenterology;  Laterality: N/A;  . HOT HEMOSTASIS N/A 10/01/2017   Procedure: HOT HEMOSTASIS (ARGON PLASMA COAGULATION/BICAP);  Surgeon: [name], MD;  Location: [place];  Service: Gastroenterology;  Laterality: N/A;  . HOT HEMOSTASIS N/A 10/17/2017   Procedure: HOT HEMOSTASIS (ARGON PLASMA COAGULATION/BICAP);  Surgeon: Otis Brace, MD;  Location: [facility] ENDOSCOPY;  Service: Gastroenterology;  Laterality: N/A;  . HOT HEMOSTASIS N/A 10/19/2017   Procedure: HOT HEMOSTASIS (ARGON PLASMA COAGULATION/BICAP);  Surgeon: Otis Brace, MD;  Location: [facility]OSCOPY;  Service: Gastroenterology;  Laterality: N/A;  . HOT HEMOSTASIS N/A 03/02/2020   Procedure: HOT HEMOSTASIS (ARGON PLASMA  COAGULATION/BICAP);  Surgeon: Ronald Lobo, MD;  Location: [facility];  Service: Endoscopy;  Laterality: N/A;  . IR IMAGING GUIDED PORT INSERTION  07/08/2018  . L shoulder Surgery  2011  . POLYPECTOMY  03/02/2020   Procedure: POLYPECTOMY;  Surgeon: Ronald Lobo, MD;  Location: [facility]OS[facility];  Service: Endoscopy;;  . SUBMUCOSAL INJECTION  09/22/2017   Procedure: SUBMUCOSAL INJECTION;  Surgeon: Clarene Essex, MD;  Location: [facility] ENDOSCOPY;  Service: Endoscopy;;  . SUBMUCOSAL INJECTION  12/04/2018   Procedure: SUBMUCOSAL INJECTION;  Surgeon: Wilford Corner, MD;  Location: [facility] ENDOSCOPY;  Service: Endoscopy;;    REVIEW OF SYSTEMS:   Review of Systems  Constitutional: Positive for fatigue and decreased appetite. Negative for chills, fever and unexpected weight change.  HENT: Positive for epistaxis (most recent 04/30/20). Negative for mouth sores, sore throat and trouble swallowing.   Eyes: Negative for eye problems and icterus.  Respiratory: Negative for cough, hemoptysis, shortness of breath and wheezing.   Cardiovascular: Positive for left lower extremity swelling. Negative for chest pain.  Gastrointestinal: Positive for 1 episode of hematemesis. Positive for melena from 04/30/20-05/10/20. Negative for abdominal pain, constipation, diarrhea, nausea and vomiting (resolved).  Genitourinary: Negative for bladder incontinence, difficulty urinating, dysuria, frequency and hematuria.   Musculoskeletal: Negative for back pain, gait problem, neck pain and neck stiffness.  Skin: Negative for itching and rash.  Neurological: Negative for dizziness, extremity weakness, gait problem, headaches, light-headedness and seizures.  Hematological: Negative for adenopathy.  Psychiatric/Behavioral: Negative for confusion, depression and sleep disturbance. The patient is not nervous/anxious.     PHYSICAL EXAMINATION:  Blood pressure 136/70, pulse 80, temperature 98.4 F (36.9 C), temperature source Tympanic,  resp. rate 18, height 5\' 6"  (1.676 m), weight 249 lb 1.6 oz (113 kg), SpO2 98 %.  ECOG PERFORMANCE STATUS: 1 - Symptomatic but completely ambulatory  Physical Exam  Constitutional: Oriented to person, place, and time and well-developed, well-nourished, and in no distress.  HENT:  Head: Normocephalic and atraumatic.  Mouth/Throat: Oropharynx is clear and moist. No oropharyngeal exudate.  Eyes: Conjunctivae are normal. Right eye exhibits no discharge. Left eye exhibits no discharge. No scleral icterus.  Neck: Normal range of motion. Neck supple.  Cardiovascular: Normal rate, regular rhythm, normal heart sounds and intact distal pulses.   Pulmonary/Chest: Effort normal and breath sounds normal. No respiratory distress. No wheezes. No rales.  Abdominal: Soft. Bowel sounds are normal. Exhibits no distension and no mass. There is no tenderness.  Musculoskeletal: Positive for calf pain. Positive for tight skin on left calf and venous statis dermatitis/skin darkening. Normal range of motion.  Lymphadenopathy:    No cervical adenopathy.  Neurological: Alert and oriented to person, place, and time. Exhibits normal muscle tone. Gait normal. Coordination normal.  Skin: Skin is warm and dry. No rash noted. Not diaphoretic. No erythema. No pallor.  Psychiatric: Mood, memory and judgment normal.  Vitals reviewed.  LABORATORY DATA: Lab Results  Component Value Date   WBC 12.9 (H) 05/12/2020   HGB 5.4 (LL) 05/12/2020   HCT 19.7 (L) 05/12/2020   MCV 87.2 05/12/2020   PLT 396 05/12/2020      Chemistry      Component Value Date/Time   NA 140 05/12/2020 1052   NA 147 (H) 04/02/2017 1708   NA 141 02/05/2017 1407   K 3.6 05/12/2020 1052   K 3.5 02/05/2017 1407   CL 108 05/12/2020 1052   CO2 25 05/12/2020 1052   CO2 24 02/05/2017 1407   BUN 8 05/12/2020 1052   BUN 5 (L) 04/02/2017 1708   BUN 9.3 02/05/2017 1407   CREATININE 0.86 05/12/2020 1052   CREATININE 0.9 02/05/2017 1407      Component  Value Date/Time   CALCIUM 8.9 05/12/2020 1052   CALCIUM 8.9 02/05/2017 1407   ALKPHOS 89 05/12/2020 1052   ALKPHOS 84 02/05/2017 1407   AST 25 05/12/2020 1052   AST 13 02/05/2017 1407   ALT 7 05/12/2020 1052   ALT 8 02/05/2017 1407   BILITOT <0.2 (L) 05/12/2020 1052   BILITOT 0.23 02/05/2017 1407       RADIOGRAPHIC STUDIES:  No results found.   ASSESSMENT/PLAN:  MORGEN TWERSKY is a 60 y.o. female with    1. 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 Extensive workup found the pt to have HHT based on her personal and familyhistoryand recurrentAVM GI bleedings. -Sheis s/pIR embolization of the left Gastric and short gastric artery branchvesselswithout evidence of residual flow in the Dieulafoy lesionon 12/10/2017. -She has been on bevacizumabevery2-4 week,treatment has been intermittently held due to large proteinuria. She has been on IV Ferric Gluconate every 1-2 weeks (unless Ferritin >200)and blood transfusions as needed.We repeatedly discussed consistency in treatments to manage her blood counts, she voices good understanding. -Today (05/12/20), the patient was a shared visit with Dr. Burr Medico -The patient is endorsing melena from 1/16-1/26/22, 1 episode of epistaxis, and 1 episode of hematemesis. She was unable to come for her appointment last week due to the snow. At her last appointment, Dr. Burr Medico discussed that she had wanted to see her on a weekly basis.  - Labs today show, Hg 5.4, plt 396K, ANC 10.5, Ferritin still pending but  38 2 weeks ago. Urine protein 100.  -Dr. Burr Medico recommends that receive IV iron with ferric cluconate 250 mg, avastin, and 1 unit of blood today.  -We will also administer 1 unit of blood tomorrow (05/13/20) -Dr. Burr Medico recommends that we monitor her  closely over the next few weeks. We will arrange for labs/infusion for IV iron +/- blood transfusion as needed twice a week on Tuesday and Fridays.  -We will see her back for labs, follow up, and infusion with avastin, IV iron, and +/- blood transfusion in 2 weeks.  Continue Beva (if urine protein <300) and Ferric Gluconate 250mg  (unless Ferritin >200) every 2 weeks.  -F/u in 2 weeks.   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 She is also on blood transfusionsas needed.Last Blood transfusion was1/14/2022.  -Given recent increase of black stool and Epistaxis,  hg at 5.4 today (05/12/20). Will given 2 units of blood over today and tomorrow.  -Dr. Burr Medico would like for her to have biweekly IV iron for the next two weeks. -We will monitor her CBC closely on Tuesdays/Fridays for the next two weeks.   3.History of right Arm DVT/ Current left calf swelling -She had right arm DVT that extended to her right clavicleon 12/07/2018.  -She is not a candidate for anticoagulation due to severe GI bleeding. Dr. Burr Medico stopped Tamoxifen, was started on low doseAmicar for epistaxis. Will maintain Port flushes. -Today, the patient was endorsing left calf pain/tightness since August 2021. We will arrange for a VAS lower extremity doppler. If positive, we will need to arrange for IVC filter placement due to contraindication for blood thinner.  -Denies chest pain or shortness of breath. Oxygen saturation 98%.   4. Reactive Leukocytosis  -She has mild leukocytosis with dominant neutrophils, likely reactive. Her prior thrombocytosis resolved.   5. Chronic Epistaxis, h/o recurrent GI Bleeding -Colonoscopy and Endoscopy in 2016 found  to have AVM and peptic ulcer disease in the stomach. She required cauterization for stomach for severe Gi bleeding in 11/2018 -Continue to follow up with her GI, Dr. Cristina Gong -Dr. Burr Medico started heron Amicar 07/17/18. Given DVT she will only take Amicar for severe epistaxis -She had recurrence with black stool and mentioned increased epistaxis recently (04/28/20). Dr. Burr Medico strongly encouraged her to contact our office when this occurs so we can check her blood counts.   6. Smoking cessation -She was smoking 10 cigarettes a day on average before recent hospitalization -Dr. Burr Medico previously strongly encouraged her to stop smoking completely, she will try.  7. Hypokalemia -Onpotassium chloride 20 mEq daily, continue. Willmonitor  9. HTN, uncontrolled, Recently diagnosed DM -Continue medications. Will monitor on Avastin.  -Continue to f/u with PCP   10. Anxiety  -She has been stressed with family health issues (her husband's passing summer 2021 and son's DM on transplant list). She has been more anxious and started having panic attacks -She is currently on Xanax at 0.5mg  dose to use only as needed. F/u with SW as needed for counseling.     PLAN:  -Proceed with Ferric Gluconate and bevacizumab today with and 2 units of blood (1 unit today and 1 unit tomorrow) -lab and infusion appointment for IV iron +/- blood transfusion if needed twice a week (tus/friday). -Beva (if urine protein <300) and Ferric Gluconate 250mg  (unless Ferritin >200 on last lab) in 2 and 4 weeks  -F/u in 2 weeks  -Arrange lower extremity doppler. If positive, will arrange IVC filter placement since anti-coagulation contraindicated in this patient.  -Dr. Burr Medico strongly encouraged the patient to call our office with significant bleeding to check blood counts sooner. She dicussed we have an answering service over the weekends.     Orders Placed This Encounter  Procedures  . Informed Consent Details:  Physician/Practitioner Attestation; Transcribe to consent form and obtain patient signature    Standing Status:   Future    Standing Expiration Date:   05/12/2021    Order Specific Question:   Physician/Practitioner attestation of informed consent for blood and or blood product transfusion    Answer:   I, the physician/practitioner, attest that I have discussed with the patient the benefits, risks, side effects, alternatives, likelihood of achieving goals and potential problems during recovery for the procedure that I have provided informed consent.    Order Specific Question:   Product(s)    Answer:  All Product(s)  . Care order/instruction    Transfuse Parameters    Standing Status:   Future    Standing Expiration Date:   05/12/2021  . Care order/instruction    Transfuse Parameters    Standing Status:   Future    Standing Expiration Date:   05/12/2021  . Type and screen    Standing Status:   Future    Number of Occurrences:   1    Standing Expiration Date:   05/12/2021  . Prepare RBC (crossmatch)    Standing Status:   Standing    Number of Occurrences:   1    Order Specific Question:   # of Units    Answer:   1 unit    Order Specific Question:   Transfusion Indications    Answer:   Symptomatic Anemia    Order Specific Question:   Number of Units to Keep Ahead    Answer:   NO units ahead    Order Specific Question:   If emergent release call blood bank    Answer:   Not emergent release  . Prepare RBC (crossmatch)    Standing Status:   Standing    Number of Occurrences:   1    Order Specific Question:   # of Units    Answer:   1 unit    Order Specific Question:   Transfusion Indications    Answer:   Symptomatic Anemia    Order Specific Question:   Number of Units to Keep Ahead    Answer:   NO units ahead    Order Specific Question:   If emergent release call blood bank    Answer:   Not emergent release  . Sample to Blood Bank    Standing Status:   Standing    Number of  Occurrences:   5    Standing Expiration Date:   05/12/2021     I spent 30-39 minutes in this encounter.   Genevieve Arbaugh L Irisa Grimsley, PA-C 05/12/20  Addendum  I have seen the patient, examined her. I agree with the assessment and and plan and have edited the notes.   Pt as persistent severe anemia, she did have clinic GI bleeding for over a week in the past two weeks, she missed her appointments last week. I again emphasized the importance of lab and treatment, and call us if she has overt GI bleeding at home. She agrees to compliant. Will proceed Avastin today and IV ferric gluconate 250mg  today and continue twice weekly in next two weeks, 2u blood transfusion today or tomorrow, and close f/u.   I again also encouraged her to continue amicar 1g bid, she is not very compliant. I refilled for her.   Truitt Merle  05/12/2020

## 2020-05-11 NOTE — Telephone Encounter (Signed)
[name] left vm to confirm appts for tomorrow.  I returned her call and let her know she has appts tomorrow here starting at 1030

## 2020-05-12 ENCOUNTER — Other Ambulatory Visit: Payer: Self-pay

## 2020-05-12 ENCOUNTER — Inpatient Hospital Stay: Payer: Medicaid Other

## 2020-05-12 ENCOUNTER — Encounter: Payer: Self-pay | Admitting: Physician Assistant

## 2020-05-12 ENCOUNTER — Other Ambulatory Visit: Payer: Self-pay | Admitting: Physician Assistant

## 2020-05-12 ENCOUNTER — Inpatient Hospital Stay (HOSPITAL_BASED_OUTPATIENT_CLINIC_OR_DEPARTMENT_OTHER): Payer: Medicaid Other | Admitting: Physician Assistant

## 2020-05-12 VITALS — BP 136/70 | HR 80 | Temp 98.4°F | Resp 18 | Ht 66.0 in | Wt 249.1 lb

## 2020-05-12 VITALS — BP 141/67 | HR 67 | Temp 98.8°F | Resp 16

## 2020-05-12 DIAGNOSIS — Z5112 Encounter for antineoplastic immunotherapy: Secondary | ICD-10-CM | POA: Diagnosis not present

## 2020-05-12 DIAGNOSIS — D500 Iron deficiency anemia secondary to blood loss (chronic): Secondary | ICD-10-CM

## 2020-05-12 DIAGNOSIS — R040 Epistaxis: Secondary | ICD-10-CM

## 2020-05-12 DIAGNOSIS — Z95828 Presence of other vascular implants and grafts: Secondary | ICD-10-CM

## 2020-05-12 DIAGNOSIS — I780 Hereditary hemorrhagic telangiectasia: Secondary | ICD-10-CM

## 2020-05-12 DIAGNOSIS — M7989 Other specified soft tissue disorders: Secondary | ICD-10-CM

## 2020-05-12 DIAGNOSIS — R808 Other proteinuria: Secondary | ICD-10-CM

## 2020-05-12 LAB — CMP (CANCER CENTER ONLY)
ALT: 7 U/L (ref 0–44)
AST: 25 U/L (ref 15–41)
Albumin: 3 g/dL — ABNORMAL LOW (ref 3.5–5.0)
Alkaline Phosphatase: 89 U/L (ref 38–126)
Anion gap: 7 (ref 5–15)
BUN: 8 mg/dL (ref 6–20)
CO2: 25 mmol/L (ref 22–32)
Calcium: 8.9 mg/dL (ref 8.9–10.3)
Chloride: 108 mmol/L (ref 98–111)
Creatinine: 0.86 mg/dL (ref 0.44–1.00)
GFR, Estimated: >60 mL/min (ref >60)
Glucose, Bld: 112 mg/dL — ABNORMAL HIGH (ref 70–99)
Potassium: 3.6 mmol/L (ref 3.5–5.1)
Sodium: 140 mmol/L (ref 135–145)
Total Bilirubin: <0.2 mg/dL — ABNORMAL LOW (ref 0.3–1.2)
Total Protein: 7 g/dL (ref 6.5–8.1)

## 2020-05-12 LAB — CBC WITH DIFFERENTIAL (CANCER CENTER ONLY)
Abs Immature Granulocytes: 0.04 10*3/uL (ref 0.00–0.07)
Basophils Absolute: 0.1 10*3/uL (ref 0.0–0.1)
Basophils Relative: 0 %
Eosinophils Absolute: 0.1 10*3/uL (ref 0.0–0.5)
Eosinophils Relative: 1 %
HCT: 19.7 % — ABNORMAL LOW (ref 36.0–46.0)
Hemoglobin: 5.4 g/dL — CL (ref 12.0–15.0)
Immature Granulocytes: 0 %
Lymphocytes Relative: 12 %
Lymphs Abs: 1.6 10*3/uL (ref 0.7–4.0)
MCH: 23.9 pg — ABNORMAL LOW (ref 26.0–34.0)
MCHC: 27.4 g/dL — ABNORMAL LOW (ref 30.0–36.0)
MCV: 87.2 fL (ref 80.0–100.0)
Monocytes Absolute: 0.6 10*3/uL (ref 0.1–1.0)
Monocytes Relative: 5 %
Neutro Abs: 10.5 10*3/uL — ABNORMAL HIGH (ref 1.7–7.7)
Neutrophils Relative %: 82 %
Platelet Count: 396 10*3/uL (ref 150–400)
RBC: 2.26 MIL/uL — ABNORMAL LOW (ref 3.87–5.11)
RDW: 22 % — ABNORMAL HIGH (ref 11.5–15.5)
WBC Count: 12.9 10*3/uL — ABNORMAL HIGH (ref 4.0–10.5)
nRBC: 0 % (ref 0.0–0.2)

## 2020-05-12 LAB — FERRITIN: Ferritin: 52 ng/mL (ref 11–307)

## 2020-05-12 LAB — SAMPLE TO BLOOD BANK

## 2020-05-12 LAB — TOTAL PROTEIN, URINE DIPSTICK: Protein, ur: 100 mg/dL — AB

## 2020-05-12 LAB — PREPARE RBC (CROSSMATCH)

## 2020-05-12 MED ORDER — SODIUM CHLORIDE 0.9 % IV SOLN
250.0000 mg | Freq: Once | INTRAVENOUS | Status: AC
  Administered 2020-05-12: 250 mg via INTRAVENOUS
  Filled 2020-05-12: qty 20

## 2020-05-12 MED ORDER — HEPARIN SOD (PORK) LOCK FLUSH 100 UNIT/ML IV SOLN
500.0000 [IU] | Freq: Once | INTRAVENOUS | Status: AC | PRN
  Administered 2020-05-12: 500 [IU]
  Filled 2020-05-12: qty 5

## 2020-05-12 MED ORDER — SODIUM CHLORIDE 0.9% FLUSH
10.0000 mL | Freq: Once | INTRAVENOUS | Status: AC
Start: 2020-05-12 — End: 2020-05-12
  Administered 2020-05-12: 10 mL
  Filled 2020-05-12: qty 10

## 2020-05-12 MED ORDER — SODIUM CHLORIDE 0.9% FLUSH
10.0000 mL | INTRAVENOUS | Status: DC | PRN
  Administered 2020-05-12: 10 mL
  Filled 2020-05-12: qty 10

## 2020-05-12 MED ORDER — ACETAMINOPHEN 325 MG PO TABS
ORAL_TABLET | ORAL | Status: AC
  Filled 2020-05-12: qty 2

## 2020-05-12 MED ORDER — ACETAMINOPHEN 325 MG PO TABS
650.0000 mg | ORAL_TABLET | Freq: Once | ORAL | Status: AC
  Administered 2020-05-12: 650 mg via ORAL

## 2020-05-12 MED ORDER — DIPHENHYDRAMINE HCL 25 MG PO CAPS
ORAL_CAPSULE | ORAL | Status: AC
  Filled 2020-05-12: qty 1

## 2020-05-12 MED ORDER — SODIUM CHLORIDE 0.9 % IV SOLN
Freq: Once | INTRAVENOUS | Status: AC
  Filled 2020-05-12: qty 250

## 2020-05-12 MED ORDER — SODIUM CHLORIDE 0.9 % IV SOLN
5.0000 mg/kg | Freq: Once | INTRAVENOUS | Status: AC
  Administered 2020-05-12: 600 mg via INTRAVENOUS
  Filled 2020-05-12: qty 16

## 2020-05-12 MED ORDER — DIPHENHYDRAMINE HCL 25 MG PO CAPS
25.0000 mg | ORAL_CAPSULE | Freq: Once | ORAL | Status: AC
  Administered 2020-05-12: 25 mg via ORAL

## 2020-05-12 NOTE — Patient Instructions (Signed)

## 2020-05-12 NOTE — Patient Instructions (Signed)
[facility] Discharge Instructions for Patients Receiving Chemotherapy  Today you received the following chemotherapy agents: Bevacizumab (Avastin)  To help prevent nausea and vomiting after your treatment, we encourage you to take your nausea medication  as prescribed.    If you develop nausea and vomiting that is not controlled by your nausea medication, call the clinic.   BELOW ARE SYMPTOMS THAT SHOULD BE REPORTED IMMEDIATELY:  *FEVER GREATER THAN 100.5 F  *CHILLS WITH OR WITHOUT FEVER  NAUSEA AND VOMITING THAT IS NOT CONTROLLED WITH YOUR NAUSEA MEDICATION  *UNUSUAL SHORTNESS OF BREATH  *UNUSUAL BRUISING OR BLEEDING  TENDERNESS IN MOUTH AND THROAT WITH OR WITHOUT PRESENCE OF ULCERS  *URINARY PROBLEMS  *BOWEL PROBLEMS  UNUSUAL RASH Items with * indicate a potential emergency and should be followed up as soon as possible.  Feel free to call the clinic should you have any questions or concerns. The clinic phone number is [phone].  Please show the [facility] at check-in to the Emergency Department and triage nurse.  Sodium Ferric Gluconate Complex injection What is this medicine? SODIUM FERRIC GLUCONATE COMPLEX (SOE dee um FER ik GLOO koe nate KOM pleks) is an iron replacement. It is used with epoetin therapy to treat low iron levels in patients who are receiving hemodialysis. This medicine may be used for other purposes; ask your health care provider or pharmacist if you have questions. COMMON BRAND NAME(S): Ferrlecit, Nulecit What should I tell my health care provider before I take this medicine? They need to know if you have any of the following conditions:  anemia that is not from iron deficiency  high levels of iron in the body  an unusual or allergic reaction to iron, benzyl alcohol, other medicines, foods, dyes, or preservatives  pregnant or are trying to become pregnant  breast-feeding How should I use this medicine? This medicine  is for infusion into a vein. It is given by a health care professional in a hospital or clinic setting. Talk to your pediatrician regarding the use of this medicine in children. While this drug may be prescribed for children as young as 12 years old for selected conditions, precautions do apply. Overdosage: If you think you have taken too much of this medicine contact a poison control center or emergency room at once. NOTE: This medicine is only for you. Do not share this medicine with others. What if I miss a dose? It is important not to miss your dose. Call your doctor or health care professional if you are unable to keep an appointment. What may interact with this medicine? Do not take this medicine with any of the following medications:  deferoxamine  dimercaprol  other iron products This medicine may also interact with the following medications:  chloramphenicol  deferasirox  medicine for blood pressure like enalapril This list may not describe all possible interactions. Give your health care provider a list of all the medicines, herbs, non-prescription drugs, or dietary supplements you use. Also tell them if you smoke, drink alcohol, or use illegal drugs. Some items may interact with your medicine. What should I watch for while using this medicine? Your condition will be monitored carefully while you are receiving this medicine. Visit your doctor for check-ups as directed. What side effects may I notice from receiving this medicine? Side effects that you should report to your doctor or health care professional as soon as possible:  allergic reactions like skin rash, itching or hives, swelling of the face,  lips, or tongue  breathing problems  changes in hearing  changes in vision  chills, flushing, or sweating  fast, irregular heartbeat  feeling faint or lightheaded, falls  fever, flu-like symptoms  high or low blood pressure  pain, tingling, numbness in the hands or  feet  severe pain in the chest, back, flanks, or groin  swelling of the ankles, feet, hands  trouble passing urine or change in the amount of urine  unusually weak or tired Side effects that usually do not require medical attention (report to your doctor or health care professional if they continue or are bothersome):  cramps  dark colored stools  diarrhea  headache  nausea, vomiting  stomach upset This list may not describe all possible side effects. Call your doctor for medical advice about side effects. You may report side effects to FDA at 1-800-FDA-1088. Where should I keep my medicine? This drug is given in a hospital or clinic and will not be stored at home. NOTE: This sheet is a summary. It may not cover all possible information. If you have questions about this medicine, talk to your doctor, pharmacist, or health care provider.  2021 Elsevier/Gold Standard (2007-12-02 15:58:57)

## 2020-05-13 ENCOUNTER — Inpatient Hospital Stay: Payer: Medicaid Other

## 2020-05-13 ENCOUNTER — Other Ambulatory Visit: Payer: Self-pay | Admitting: Nurse Practitioner

## 2020-05-13 ENCOUNTER — Other Ambulatory Visit: Payer: Self-pay

## 2020-05-13 ENCOUNTER — Telehealth: Payer: Self-pay

## 2020-05-13 DIAGNOSIS — Z5112 Encounter for antineoplastic immunotherapy: Secondary | ICD-10-CM | POA: Diagnosis not present

## 2020-05-13 DIAGNOSIS — I10 Essential (primary) hypertension: Secondary | ICD-10-CM

## 2020-05-13 MED ORDER — AMINOCAPROIC ACID 1000 MG PO TABS: 1000.0000 mg | ORAL_TABLET | Freq: Two times a day (BID) | ORAL | 3 refills | Status: DC

## 2020-05-13 MED ORDER — DIPHENHYDRAMINE HCL 25 MG PO CAPS
25.0000 mg | ORAL_CAPSULE | Freq: Once | ORAL | Status: AC
  Administered 2020-05-13: 25 mg via ORAL

## 2020-05-13 MED ORDER — ACETAMINOPHEN 325 MG PO TABS
650.0000 mg | ORAL_TABLET | Freq: Once | ORAL | Status: AC
  Administered 2020-05-13: 650 mg via ORAL

## 2020-05-13 MED ORDER — ACETAMINOPHEN 325 MG PO TABS
ORAL_TABLET | ORAL | Status: AC
  Filled 2020-05-13: qty 2

## 2020-05-13 MED ORDER — SODIUM CHLORIDE 0.9% FLUSH
10.0000 mL | INTRAVENOUS | Status: AC | PRN
  Administered 2020-05-13: 10 mL
  Filled 2020-05-13: qty 10

## 2020-05-13 MED ORDER — SODIUM CHLORIDE 0.9% IV SOLUTION
250.0000 mL | Freq: Once | INTRAVENOUS | Status: AC
  Administered 2020-05-13: 250 mL via INTRAVENOUS
  Filled 2020-05-13: qty 250

## 2020-05-13 MED ORDER — DIPHENHYDRAMINE HCL 25 MG PO CAPS
ORAL_CAPSULE | ORAL | Status: AC
  Filled 2020-05-13: qty 1

## 2020-05-13 MED ORDER — HEPARIN SOD (PORK) LOCK FLUSH 100 UNIT/ML IV SOLN
500.0000 [IU] | Freq: Every day | INTRAVENOUS | Status: AC | PRN
  Administered 2020-05-13: 500 [IU]
  Filled 2020-05-13: qty 5

## 2020-05-13 NOTE — Telephone Encounter (Signed)
-----   Message from Truitt Merle, MD sent at 05/13/2020 12:30 PM EST ----- My nurse,  Please all pt to see if she is still taking amicar 1g bid. Please encourage her to continue, I refilled to [facility] for her today  Thanks  Krista Blue

## 2020-05-13 NOTE — Telephone Encounter (Signed)
TC to relay Dr Ernestina Penna message about amicar. Spoke with Pt's son who stated he will relay the message to his mother.

## 2020-05-14 LAB — BPAM RBC
Blood Product Expiration Date: 202202072359
Blood Product Expiration Date: 202202202359
ISSUE DATE / TIME: 202201281512
ISSUE DATE / TIME: 202201291023
Unit Type and Rh: 600
Unit Type and Rh: 600

## 2020-05-14 LAB — TYPE AND SCREEN
ABO/RH(D): A NEG
Antibody Screen: NEGATIVE
Unit division: 0
Unit division: 0

## 2020-05-15 ENCOUNTER — Telehealth: Payer: Self-pay | Admitting: Physician Assistant

## 2020-05-15 ENCOUNTER — Other Ambulatory Visit: Payer: Self-pay

## 2020-05-15 ENCOUNTER — Ambulatory Visit (HOSPITAL_COMMUNITY): Admission: RE | Admit: 2020-05-15 | Payer: Medicaid Other | Source: Ambulatory Visit

## 2020-05-15 ENCOUNTER — Inpatient Hospital Stay: Payer: Medicaid Other

## 2020-05-15 VITALS — BP 127/77 | HR 83 | Temp 99.1°F | Resp 18 | Wt 246.0 lb

## 2020-05-15 DIAGNOSIS — Z5112 Encounter for antineoplastic immunotherapy: Secondary | ICD-10-CM | POA: Diagnosis not present

## 2020-05-15 DIAGNOSIS — D500 Iron deficiency anemia secondary to blood loss (chronic): Secondary | ICD-10-CM

## 2020-05-15 DIAGNOSIS — Z95828 Presence of other vascular implants and grafts: Secondary | ICD-10-CM

## 2020-05-15 DIAGNOSIS — I780 Hereditary hemorrhagic telangiectasia: Secondary | ICD-10-CM

## 2020-05-15 LAB — CBC WITH DIFFERENTIAL (CANCER CENTER ONLY)
Abs Immature Granulocytes: 0.09 10*3/uL — ABNORMAL HIGH (ref 0.00–0.07)
Basophils Absolute: 0.1 10*3/uL (ref 0.0–0.1)
Basophils Relative: 0 %
Eosinophils Absolute: 0.2 10*3/uL (ref 0.0–0.5)
Eosinophils Relative: 1 %
HCT: 27.1 % — ABNORMAL LOW (ref 36.0–46.0)
Hemoglobin: 7.9 g/dL — ABNORMAL LOW (ref 12.0–15.0)
Immature Granulocytes: 1 %
Lymphocytes Relative: 11 %
Lymphs Abs: 1.8 10*3/uL (ref 0.7–4.0)
MCH: 25.2 pg — ABNORMAL LOW (ref 26.0–34.0)
MCHC: 29.2 g/dL — ABNORMAL LOW (ref 30.0–36.0)
MCV: 86.3 fL (ref 80.0–100.0)
Monocytes Absolute: 0.7 10*3/uL (ref 0.1–1.0)
Monocytes Relative: 4 %
Neutro Abs: 13.3 10*3/uL — ABNORMAL HIGH (ref 1.7–7.7)
Neutrophils Relative %: 83 %
Platelet Count: 402 10*3/uL — ABNORMAL HIGH (ref 150–400)
RBC: 3.14 MIL/uL — ABNORMAL LOW (ref 3.87–5.11)
RDW: 22.1 % — ABNORMAL HIGH (ref 11.5–15.5)
WBC Count: 16 10*3/uL — ABNORMAL HIGH (ref 4.0–10.5)
nRBC: 0.1 % (ref 0.0–0.2)

## 2020-05-15 LAB — CMP (CANCER CENTER ONLY)
ALT: 9 U/L (ref 0–44)
AST: 16 U/L (ref 15–41)
Albumin: 3.3 g/dL — ABNORMAL LOW (ref 3.5–5.0)
Alkaline Phosphatase: 77 U/L (ref 38–126)
Anion gap: 9 (ref 5–15)
BUN: 6 mg/dL (ref 6–20)
CO2: 26 mmol/L (ref 22–32)
Calcium: 9.1 mg/dL (ref 8.9–10.3)
Chloride: 107 mmol/L (ref 98–111)
Creatinine: 0.78 mg/dL (ref 0.44–1.00)
GFR, Estimated: >60 mL/min (ref >60)
Glucose, Bld: 100 mg/dL — ABNORMAL HIGH (ref 70–99)
Potassium: 3.5 mmol/L (ref 3.5–5.1)
Sodium: 142 mmol/L (ref 135–145)
Total Bilirubin: 0.4 mg/dL (ref 0.3–1.2)
Total Protein: 7.3 g/dL (ref 6.5–8.1)

## 2020-05-15 LAB — SAMPLE TO BLOOD BANK

## 2020-05-15 MED ORDER — SODIUM CHLORIDE 0.9 % IV SOLN
Freq: Once | INTRAVENOUS | Status: AC
  Filled 2020-05-15: qty 250

## 2020-05-15 MED ORDER — SODIUM CHLORIDE 0.9 % IV SOLN
250.0000 mg | Freq: Once | INTRAVENOUS | Status: AC
  Administered 2020-05-15: 250 mg via INTRAVENOUS
  Filled 2020-05-15: qty 20

## 2020-05-15 MED ORDER — HEPARIN SOD (PORK) LOCK FLUSH 100 UNIT/ML IV SOLN
500.0000 [IU] | Freq: Once | INTRAVENOUS | Status: AC
  Administered 2020-05-15: 500 [IU] via INTRAVENOUS
  Filled 2020-05-15: qty 5

## 2020-05-15 MED ORDER — SODIUM CHLORIDE 0.9% FLUSH
10.0000 mL | Freq: Once | INTRAVENOUS | Status: AC
Start: 2020-05-15 — End: 2020-05-15
  Administered 2020-05-15: 10 mL
  Filled 2020-05-15: qty 10

## 2020-05-15 MED ORDER — SODIUM CHLORIDE 0.9 % IV SOLN
250.0000 mg | Freq: Once | INTRAVENOUS | Status: DC
  Filled 2020-05-15: qty 20

## 2020-05-15 MED ORDER — SODIUM CHLORIDE 0.9% FLUSH
10.0000 mL | Freq: Once | INTRAVENOUS | Status: AC
  Administered 2020-05-15: 10 mL via INTRAVENOUS
  Filled 2020-05-15: qty 10

## 2020-05-15 NOTE — Patient Instructions (Signed)
Sodium Ferric Gluconate Complex injection What is this medicine? SODIUM FERRIC GLUCONATE COMPLEX (SOE dee um FER ik GLOO koe nate KOM pleks) is an iron replacement. It is used with epoetin therapy to treat low iron levels in patients who are receiving hemodialysis. This medicine may be used for other purposes; ask your health care provider or pharmacist if you have questions. COMMON BRAND NAME(S): Ferrlecit, Nulecit What should I tell my health care provider before I take this medicine? They need to know if you have any of the following conditions:  anemia that is not from iron deficiency  high levels of iron in the body  an unusual or allergic reaction to iron, benzyl alcohol, other medicines, foods, dyes, or preservatives  pregnant or are trying to become pregnant  breast-feeding How should I use this medicine? This medicine is for infusion into a vein. It is given by a health care professional in a hospital or clinic setting. Talk to your pediatrician regarding the use of this medicine in children. While this drug may be prescribed for children as young as 6 years old for selected conditions, precautions do apply. Overdosage: If you think you have taken too much of this medicine contact a poison control center or emergency room at once. NOTE: This medicine is only for you. Do not share this medicine with others. What if I miss a dose? It is important not to miss your dose. Call your doctor or health care professional if you are unable to keep an appointment. What may interact with this medicine? Do not take this medicine with any of the following medications:  deferoxamine  dimercaprol  other iron products This medicine may also interact with the following medications:  chloramphenicol  deferasirox  medicine for blood pressure like enalapril This list may not describe all possible interactions. Give your health care provider a list of all the medicines, herbs,  non-prescription drugs, or dietary supplements you use. Also tell them if you smoke, drink alcohol, or use illegal drugs. Some items may interact with your medicine. What should I watch for while using this medicine? Your condition will be monitored carefully while you are receiving this medicine. Visit your doctor for check-ups as directed. What side effects may I notice from receiving this medicine? Side effects that you should report to your doctor or health care professional as soon as possible:  allergic reactions like skin rash, itching or hives, swelling of the face, lips, or tongue  breathing problems  changes in hearing  changes in vision  chills, flushing, or sweating  fast, irregular heartbeat  feeling faint or lightheaded, falls  fever, flu-like symptoms  high or low blood pressure  pain, tingling, numbness in the hands or feet  severe pain in the chest, back, flanks, or groin  swelling of the ankles, feet, hands  trouble passing urine or change in the amount of urine  unusually weak or tired Side effects that usually do not require medical attention (report to your doctor or health care professional if they continue or are bothersome):  cramps  dark colored stools  diarrhea  headache  nausea, vomiting  stomach upset This list may not describe all possible side effects. Call your doctor for medical advice about side effects. You may report side effects to FDA at 1-800-FDA-1088. Where should I keep my medicine? This drug is given in a hospital or clinic and will not be stored at home. NOTE: This sheet is a summary. It may not cover all   possible information. If you have questions about this medicine, talk to your doctor, pharmacist, or health care provider.  2020 Elsevier/Gold Standard (2007-12-02 15:58:57)   Bevacizumab injection What is this medicine? BEVACIZUMAB (be va SIZ yoo mab) is a monoclonal antibody. It is used to treat many types of  cancer. This medicine may be used for other purposes; ask your health care provider or pharmacist if you have questions. COMMON BRAND NAME(S): Avastin, MVASI, Zirabev What should I tell my health care provider before I take this medicine? They need to know if you have any of these conditions:  diabetes  heart disease  high blood pressure  history of coughing up blood  prior anthracycline chemotherapy (e.g., doxorubicin, daunorubicin, epirubicin)  recent or ongoing radiation therapy  recent or planning to have surgery  stroke  an unusual or allergic reaction to bevacizumab, hamster proteins, mouse proteins, other medicines, foods, dyes, or preservatives  pregnant or trying to get pregnant  breast-feeding How should I use this medicine? This medicine is for infusion into a vein. It is given by a health care professional in a hospital or clinic setting. Talk to your pediatrician regarding the use of this medicine in children. Special care may be needed. Overdosage: If you think you have taken too much of this medicine contact a poison control center or emergency room at once. NOTE: This medicine is only for you. Do not share this medicine with others. What if I miss a dose? It is important not to miss your dose. Call your doctor or health care professional if you are unable to keep an appointment. What may interact with this medicine? Interactions are not expected. This list may not describe all possible interactions. Give your health care provider a list of all the medicines, herbs, non-prescription drugs, or dietary supplements you use. Also tell them if you smoke, drink alcohol, or use illegal drugs. Some items may interact with your medicine. What should I watch for while using this medicine? Your condition will be monitored carefully while you are receiving this medicine. You will need important blood work and urine testing done while you are taking this medicine. This  medicine may increase your risk to bruise or bleed. Call your doctor or health care professional if you notice any unusual bleeding. Before having surgery, talk to your health care provider to make sure it is ok. This drug can increase the risk of poor healing of your surgical site or wound. You will need to stop this drug for 28 days before surgery. After surgery, wait at least 28 days before restarting this drug. Make sure the surgical site or wound is healed enough before restarting this drug. Talk to your health care provider if questions. Do not become pregnant while taking this medicine or for 6 months after stopping it. Women should inform their doctor if they wish to become pregnant or think they might be pregnant. There is a potential for serious side effects to an unborn child. Talk to your health care professional or pharmacist for more information. Do not breast-feed an infant while taking this medicine and for 6 months after the last dose. This medicine has caused ovarian failure in some women. This medicine may interfere with the ability to have a child. You should talk to your doctor or health care professional if you are concerned about your fertility. What side effects may I notice from receiving this medicine? Side effects that you should report to your doctor or health care professional   as soon as possible:  allergic reactions like skin rash, itching or hives, swelling of the face, lips, or tongue  chest pain or chest tightness  chills  coughing up blood  high fever  seizures  severe constipation  signs and symptoms of bleeding such as bloody or black, tarry stools; red or dark-brown urine; spitting up blood or brown material that looks like coffee grounds; red spots on the skin; unusual bruising or bleeding from the eye, gums, or nose  signs and symptoms of a blood clot such as breathing problems; chest pain; severe, sudden headache; pain, swelling, warmth in the  leg  signs and symptoms of a stroke like changes in vision; confusion; trouble speaking or understanding; severe headaches; sudden numbness or weakness of the face, arm or leg; trouble walking; dizziness; loss of balance or coordination  stomach pain  sweating  swelling of legs or ankles  vomiting  weight gain Side effects that usually do not require medical attention (report to your doctor or health care professional if they continue or are bothersome):  back pain  changes in taste  decreased appetite  dry skin  nausea  tiredness This list may not describe all possible side effects. Call your doctor for medical advice about side effects. You may report side effects to FDA at 1-800-FDA-1088. Where should I keep my medicine? This drug is given in a hospital or clinic and will not be stored at home. NOTE: This sheet is a summary. It may not cover all possible information. If you have questions about this medicine, talk to your doctor, pharmacist, or health care provider.  2020 Elsevier/Gold Standard (2019-01-27 10:50:46)   Blood Transfusion, Adult, Care After This sheet gives you information about how to care for yourself after your procedure. Your doctor may also give you more specific instructions. If you have problems or questions, contact your doctor. What can I expect after the procedure? After the procedure, it is common to have:  Bruising and soreness at the IV site.  A fever or chills on the day of the procedure. This may be your body's response to the new blood cells received.  A headache. Follow these instructions at home: Insertion site care      Follow instructions from your doctor about how to take care of your insertion site. This is where an IV tube was put into your vein. Make sure you: ? Wash your hands with soap and water before and after you change your bandage (dressing). If you cannot use soap and water, use hand sanitizer. ? Change your bandage  as told by your doctor.  Check your insertion site every day for signs of infection. Check for: ? Redness, swelling, or pain. ? Bleeding from the site. ? Warmth. ? Pus or a bad smell. General instructions  Take over-the-counter and prescription medicines only as told by your doctor.  Rest as told by your doctor.  Go back to your normal activities as told by your doctor.  Keep all follow-up visits as told by your doctor. This is important. Contact a doctor if:  You have itching or red, swollen areas of skin (hives).  You feel worried or nervous (anxious).  You feel weak after doing your normal activities.  You have redness, swelling, warmth, or pain around the insertion site.  You have blood coming from the insertion site, and the blood does not stop with pressure.  You have pus or a bad smell coming from the insertion site. Get help   right away if:  You have signs of a serious reaction. This may be coming from an allergy or the body's defense system (immune system). Signs include: ? Trouble breathing or shortness of breath. ? Swelling of the face or feeling warm (flushed). ? Fever or chills. ? Head, chest, or back pain. ? Dark pee (urine) or blood in the pee. ? Widespread rash. ? Fast heartbeat. ? Feeling dizzy or light-headed. You may receive your blood transfusion in an outpatient setting. If so, you will be told whom to contact to report any reactions. These symptoms may be an emergency. Do not wait to see if the symptoms will go away. Get medical help right away. Call your local emergency services (911 in the [place] Do not drive yourself to the hospital. Summary  Bruising and soreness at the IV site are common.  Check your insertion site every day for signs of infection.  Rest as told by your doctor. Go back to your normal activities as told by your doctor.  Get help right away if you have signs of a serious reaction. This information is not intended to replace  advice given to you by your health care provider. Make sure you discuss any questions you have with your health care provider. Document Revised: 09/24/2018 Document Reviewed: 09/24/2018 Elsevier Patient Education  2020 Elsevier Inc.   

## 2020-05-15 NOTE — Telephone Encounter (Signed)
Scheduled appointments per 1/28 los. Will have updated calendar printed for patient at next visit.

## 2020-05-15 NOTE — Progress Notes (Signed)
Patient was observed for 30 minutes post iron infusion with no complaints. Vitals stable upon discharge.  Patient was given calender and informed of where to go for vascular study tomorrow at 1pm. Patient verbalized understanding and states that she will be there tomorrow at 1pm.

## 2020-05-16 ENCOUNTER — Ambulatory Visit (HOSPITAL_COMMUNITY)
Admission: RE | Admit: 2020-05-16 | Payer: Medicaid Other | Source: Ambulatory Visit | Attending: Physician Assistant | Admitting: Physician Assistant

## 2020-05-16 ENCOUNTER — Ambulatory Visit (HOSPITAL_COMMUNITY)
Admission: RE | Admit: 2020-05-16 | Discharge: 2020-05-16 | Disposition: A | Discharge status: Home / Self Care | Payer: Medicaid Other | Source: Ambulatory Visit | Attending: Physician Assistant | Admitting: Physician Assistant

## 2020-05-16 DIAGNOSIS — M7989 Other specified soft tissue disorders: Secondary | ICD-10-CM | POA: Diagnosis not present

## 2020-05-16 DIAGNOSIS — D500 Iron deficiency anemia secondary to blood loss (chronic): Secondary | ICD-10-CM | POA: Insufficient documentation

## 2020-05-17 ENCOUNTER — Other Ambulatory Visit: Payer: Self-pay | Admitting: Hematology

## 2020-05-17 ENCOUNTER — Other Ambulatory Visit: Payer: Self-pay | Admitting: Physician Assistant

## 2020-05-17 DIAGNOSIS — N3941 Urge incontinence: Secondary | ICD-10-CM | POA: Diagnosis not present

## 2020-05-17 DIAGNOSIS — R8290 Unspecified abnormal findings in urine: Secondary | ICD-10-CM

## 2020-05-17 DIAGNOSIS — N319 Neuromuscular dysfunction of bladder, unspecified: Secondary | ICD-10-CM | POA: Diagnosis not present

## 2020-05-19 ENCOUNTER — Other Ambulatory Visit: Payer: Medicaid Other

## 2020-05-19 ENCOUNTER — Inpatient Hospital Stay: Payer: Medicaid Other

## 2020-05-19 ENCOUNTER — Inpatient Hospital Stay: Payer: Medicaid Other | Admitting: Physician Assistant

## 2020-05-22 ENCOUNTER — Other Ambulatory Visit: Payer: Self-pay | Admitting: Internal Medicine

## 2020-05-22 ENCOUNTER — Other Ambulatory Visit: Payer: Self-pay | Admitting: Hematology

## 2020-05-22 ENCOUNTER — Other Ambulatory Visit: Payer: Self-pay | Admitting: Nurse Practitioner

## 2020-05-22 DIAGNOSIS — F331 Major depressive disorder, recurrent, moderate: Secondary | ICD-10-CM

## 2020-05-22 DIAGNOSIS — E785 Hyperlipidemia, unspecified: Secondary | ICD-10-CM

## 2020-05-22 DIAGNOSIS — I10 Essential (primary) hypertension: Secondary | ICD-10-CM

## 2020-05-23 ENCOUNTER — Other Ambulatory Visit: Payer: Self-pay | Admitting: Physician Assistant

## 2020-05-23 ENCOUNTER — Other Ambulatory Visit: Payer: Self-pay | Admitting: Hematology

## 2020-05-23 ENCOUNTER — Inpatient Hospital Stay: Payer: Medicaid Other

## 2020-05-23 ENCOUNTER — Telehealth: Payer: Self-pay

## 2020-05-23 ENCOUNTER — Other Ambulatory Visit: Payer: Self-pay | Admitting: Nurse Practitioner

## 2020-05-23 ENCOUNTER — Inpatient Hospital Stay: Payer: Medicaid Other | Attending: Hematology

## 2020-05-23 ENCOUNTER — Other Ambulatory Visit: Payer: Self-pay

## 2020-05-23 VITALS — BP 158/60 | HR 70 | Temp 98.9°F | Resp 18

## 2020-05-23 DIAGNOSIS — I780 Hereditary hemorrhagic telangiectasia: Secondary | ICD-10-CM | POA: Insufficient documentation

## 2020-05-23 DIAGNOSIS — K922 Gastrointestinal hemorrhage, unspecified: Secondary | ICD-10-CM | POA: Insufficient documentation

## 2020-05-23 DIAGNOSIS — D500 Iron deficiency anemia secondary to blood loss (chronic): Secondary | ICD-10-CM | POA: Diagnosis not present

## 2020-05-23 DIAGNOSIS — Z79899 Other long term (current) drug therapy: Secondary | ICD-10-CM | POA: Diagnosis not present

## 2020-05-23 DIAGNOSIS — R8290 Unspecified abnormal findings in urine: Secondary | ICD-10-CM

## 2020-05-23 DIAGNOSIS — Z5112 Encounter for antineoplastic immunotherapy: Secondary | ICD-10-CM | POA: Insufficient documentation

## 2020-05-23 DIAGNOSIS — Z95828 Presence of other vascular implants and grafts: Secondary | ICD-10-CM

## 2020-05-23 DIAGNOSIS — N3000 Acute cystitis without hematuria: Secondary | ICD-10-CM

## 2020-05-23 LAB — URINALYSIS, COMPLETE (UACMP) WITH MICROSCOPIC
Bilirubin Urine: NEGATIVE
Glucose, UA: NEGATIVE mg/dL
Hgb urine dipstick: NEGATIVE
Ketones, ur: NEGATIVE mg/dL
Nitrite: POSITIVE — AB
Protein, ur: 100 mg/dL — AB
Specific Gravity, Urine: 1.02 (ref 1.005–1.030)
WBC, UA: >50 WBC/hpf — ABNORMAL HIGH (ref 0–5)
pH: 5 (ref 5.0–8.0)

## 2020-05-23 LAB — CBC WITH DIFFERENTIAL (CANCER CENTER ONLY)
Abs Immature Granulocytes: 0.04 10*3/uL (ref 0.00–0.07)
Basophils Absolute: 0.1 10*3/uL (ref 0.0–0.1)
Basophils Relative: 0 %
Eosinophils Absolute: 0.1 10*3/uL (ref 0.0–0.5)
Eosinophils Relative: 1 %
HCT: 27.2 % — ABNORMAL LOW (ref 36.0–46.0)
Hemoglobin: 7.7 g/dL — ABNORMAL LOW (ref 12.0–15.0)
Immature Granulocytes: 0 %
Lymphocytes Relative: 12 %
Lymphs Abs: 1.6 10*3/uL (ref 0.7–4.0)
MCH: 24.8 pg — ABNORMAL LOW (ref 26.0–34.0)
MCHC: 28.3 g/dL — ABNORMAL LOW (ref 30.0–36.0)
MCV: 87.7 fL (ref 80.0–100.0)
Monocytes Absolute: 0.6 10*3/uL (ref 0.1–1.0)
Monocytes Relative: 4 %
Neutro Abs: 11.1 10*3/uL — ABNORMAL HIGH (ref 1.7–7.7)
Neutrophils Relative %: 83 %
Platelet Count: 271 10*3/uL (ref 150–400)
RBC: 3.1 MIL/uL — ABNORMAL LOW (ref 3.87–5.11)
RDW: 22 % — ABNORMAL HIGH (ref 11.5–15.5)
WBC Count: 13.5 10*3/uL — ABNORMAL HIGH (ref 4.0–10.5)
nRBC: 0 % (ref 0.0–0.2)

## 2020-05-23 LAB — CMP (CANCER CENTER ONLY)
ALT: 8 U/L (ref 0–44)
AST: 14 U/L — ABNORMAL LOW (ref 15–41)
Albumin: 3.3 g/dL — ABNORMAL LOW (ref 3.5–5.0)
Alkaline Phosphatase: 75 U/L (ref 38–126)
Anion gap: 9 (ref 5–15)
BUN: 8 mg/dL (ref 6–20)
CO2: 24 mmol/L (ref 22–32)
Calcium: 9 mg/dL (ref 8.9–10.3)
Chloride: 107 mmol/L (ref 98–111)
Creatinine: 0.76 mg/dL (ref 0.44–1.00)
GFR, Estimated: >60 mL/min (ref >60)
Glucose, Bld: 122 mg/dL — ABNORMAL HIGH (ref 70–99)
Potassium: 3.1 mmol/L — ABNORMAL LOW (ref 3.5–5.1)
Sodium: 140 mmol/L (ref 135–145)
Total Bilirubin: 0.3 mg/dL (ref 0.3–1.2)
Total Protein: 7.2 g/dL (ref 6.5–8.1)

## 2020-05-23 LAB — SAMPLE TO BLOOD BANK

## 2020-05-23 LAB — PREPARE RBC (CROSSMATCH)

## 2020-05-23 MED ORDER — POTASSIUM CHLORIDE 20 MEQ PO PACK: 20.0000 meq | PACK | Freq: Every day | ORAL | 1 refills | Status: DC

## 2020-05-23 MED ORDER — SODIUM CHLORIDE 0.9 % IV SOLN
250.0000 mg | Freq: Once | INTRAVENOUS | Status: AC
  Administered 2020-05-23: 250 mg via INTRAVENOUS
  Filled 2020-05-23: qty 20

## 2020-05-23 MED ORDER — ACETAMINOPHEN 325 MG PO TABS
650.0000 mg | ORAL_TABLET | Freq: Once | ORAL | Status: AC
  Administered 2020-05-23: 650 mg via ORAL

## 2020-05-23 MED ORDER — DIPHENHYDRAMINE HCL 25 MG PO CAPS
25.0000 mg | ORAL_CAPSULE | Freq: Once | ORAL | Status: AC
  Administered 2020-05-23: 25 mg via ORAL

## 2020-05-23 MED ORDER — SODIUM CHLORIDE 0.9% IV SOLUTION
250.0000 mL | Freq: Once | INTRAVENOUS | Status: AC
  Administered 2020-05-23: 250 mL via INTRAVENOUS
  Filled 2020-05-23: qty 250

## 2020-05-23 MED ORDER — SODIUM CHLORIDE 0.9 % IV SOLN
Freq: Once | INTRAVENOUS | Status: AC
  Filled 2020-05-23: qty 250

## 2020-05-23 MED ORDER — SODIUM CHLORIDE 0.9% FLUSH
10.0000 mL | Freq: Once | INTRAVENOUS | Status: AC
  Administered 2020-05-23: 10 mL
  Filled 2020-05-23: qty 10

## 2020-05-23 MED ORDER — HEPARIN SOD (PORK) LOCK FLUSH 100 UNIT/ML IV SOLN
500.0000 [IU] | Freq: Once | INTRAVENOUS | Status: AC
  Administered 2020-05-23: 500 [IU]
  Filled 2020-05-23: qty 5

## 2020-05-23 MED ORDER — ACETAMINOPHEN 325 MG PO TABS
ORAL_TABLET | ORAL | Status: AC
  Filled 2020-05-23: qty 2

## 2020-05-23 MED ORDER — SULFAMETHOXAZOLE-TRIMETHOPRIM 800-160 MG PO TABS: 1.0000 | ORAL_TABLET | Freq: Two times a day (BID) | ORAL | 0 refills | Status: DC

## 2020-05-23 MED ORDER — DIPHENHYDRAMINE HCL 25 MG PO CAPS
ORAL_CAPSULE | ORAL | Status: AC
  Filled 2020-05-23: qty 1

## 2020-05-23 NOTE — Telephone Encounter (Signed)
Message left for return call concerning most recent lab

## 2020-05-23 NOTE — Patient Instructions (Addendum)
Sodium Ferric Gluconate Complex injection What is this medicine? SODIUM FERRIC GLUCONATE COMPLEX (SOE dee um FER ik GLOO koe nate KOM pleks) is an iron replacement. It is used with epoetin therapy to treat low iron levels in patients who are receiving hemodialysis. This medicine may be used for other purposes; ask your health care provider or pharmacist if you have questions. COMMON BRAND NAME(S): Ferrlecit, Nulecit What should I tell my health care provider before I take this medicine? They need to know if you have any of the following conditions:  anemia that is not from iron deficiency  high levels of iron in the body  an unusual or allergic reaction to iron, benzyl alcohol, other medicines, foods, dyes, or preservatives  pregnant or are trying to become pregnant  breast-feeding How should I use this medicine? This medicine is for infusion into a vein. It is given by a health care professional in a hospital or clinic setting. Talk to your pediatrician regarding the use of this medicine in children. While this drug may be prescribed for children as young as 6 years old for selected conditions, precautions do apply. Overdosage: If you think you have taken too much of this medicine contact a poison control center or emergency room at once. NOTE: This medicine is only for you. Do not share this medicine with others. What if I miss a dose? It is important not to miss your dose. Call your doctor or health care professional if you are unable to keep an appointment. What may interact with this medicine? Do not take this medicine with any of the following medications:  deferoxamine  dimercaprol  other iron products This medicine may also interact with the following medications:  chloramphenicol  deferasirox  medicine for blood pressure like enalapril This list may not describe all possible interactions. Give your health care provider a list of all the medicines, herbs,  non-prescription drugs, or dietary supplements you use. Also tell them if you smoke, drink alcohol, or use illegal drugs. Some items may interact with your medicine. What should I watch for while using this medicine? Your condition will be monitored carefully while you are receiving this medicine. Visit your doctor for check-ups as directed. What side effects may I notice from receiving this medicine? Side effects that you should report to your doctor or health care professional as soon as possible:  allergic reactions like skin rash, itching or hives, swelling of the face, lips, or tongue  breathing problems  changes in hearing  changes in vision  chills, flushing, or sweating  fast, irregular heartbeat  feeling faint or lightheaded, falls  fever, flu-like symptoms  high or low blood pressure  pain, tingling, numbness in the hands or feet  severe pain in the chest, back, flanks, or groin  swelling of the ankles, feet, hands  trouble passing urine or change in the amount of urine  unusually weak or tired Side effects that usually do not require medical attention (report to your doctor or health care professional if they continue or are bothersome):  cramps  dark colored stools  diarrhea  headache  nausea, vomiting  stomach upset This list may not describe all possible side effects. Call your doctor for medical advice about side effects. You may report side effects to FDA at 1-800-FDA-1088. Where should I keep my medicine? This drug is given in a hospital or clinic and will not be stored at home. NOTE: This sheet is a summary. It may not cover all   possible information. If you have questions about this medicine, talk to your doctor, pharmacist, or health care provider.  2021 Elsevier/Gold Standard (2007-12-02 15:58:57)    Blood Transfusion, Adult, Care After This sheet gives you information about how to care for yourself after your procedure. Your doctor may  also give you more specific instructions. If you have problems or questions, contact your doctor. What can I expect after the procedure? After the procedure, it is common to have:  Bruising and soreness at the IV site.  A fever or chills on the day of the procedure. This may be your body's response to the new blood cells received.  A headache. Follow these instructions at home: Insertion site care  Follow instructions from your doctor about how to take care of your insertion site. This is where an IV tube was put into your vein. Make sure you: ? Wash your hands with soap and water before and after you change your bandage (dressing). If you cannot use soap and water, use hand sanitizer. ? Change your bandage as told by your doctor.  Check your insertion site every day for signs of infection. Check for: ? Redness, swelling, or pain. ? Bleeding from the site. ? Warmth. ? Pus or a bad smell.      General instructions  Take over-the-counter and prescription medicines only as told by your doctor.  Rest as told by your doctor.  Go back to your normal activities as told by your doctor.  Keep all follow-up visits as told by your doctor. This is important. Contact a doctor if:  You have itching or red, swollen areas of skin (hives).  You feel worried or nervous (anxious).  You feel weak after doing your normal activities.  You have redness, swelling, warmth, or pain around the insertion site.  You have blood coming from the insertion site, and the blood does not stop with pressure.  You have pus or a bad smell coming from the insertion site. Get help right away if:  You have signs of a serious reaction. This may be coming from an allergy or the body's defense system (immune system). Signs include: ? Trouble breathing or shortness of breath. ? Swelling of the face or feeling warm (flushed). ? Fever or chills. ? Head, chest, or back pain. ? Dark pee (urine) or blood in the  pee. ? Widespread rash. ? Fast heartbeat. ? Feeling dizzy or light-headed. You may receive your blood transfusion in an outpatient setting. If so, you will be told whom to contact to report any reactions. These symptoms may be an emergency. Do not wait to see if the symptoms will go away. Get medical help right away. Call your local emergency services (911 in the [place] Do not drive yourself to the hospital. Summary  Bruising and soreness at the IV site are common.  Check your insertion site every day for signs of infection.  Rest as told by your doctor. Go back to your normal activities as told by your doctor.  Get help right away if you have signs of a serious reaction. This information is not intended to replace advice given to you by your health care provider. Make sure you discuss any questions you have with your health care provider. Document Revised: 09/24/2018 Document Reviewed: 09/24/2018 Elsevier Patient Education  2021 [place].   Urinary Tract Infection, Adult A urinary tract infection (UTI) is an infection of any part of the urinary tract. The urinary tract includes:  The  kidneys.  The ureters.  The bladder.  The urethra. These organs make, store, and get rid of pee (urine) in the body. What are the causes? This infection is caused by germs (bacteria) in your genital area. These germs grow and cause swelling (inflammation) of your urinary tract. What increases the risk? The following factors may make you more likely to develop this condition:  Using a small, thin tube (catheter) to drain pee.  Not being able to control when you pee or poop (incontinence).  Being female. If you are female, these things can increase the risk: ? Using these methods to prevent pregnancy:  A medicine that kills sperm (spermicide).  A device that blocks sperm (diaphragm). ? Having low levels of a female hormone (estrogen). ? Being pregnant. You are more likely to develop  this condition if:  You have genes that add to your risk.  You are sexually active.  You take antibiotic medicines.  You have trouble peeing because of: ? A prostate that is bigger than normal, if you are female. ? A blockage in the part of your body that drains pee from the bladder. ? A kidney stone. ? A nerve condition that affects your bladder. ? Not getting enough to drink. ? Not peeing often enough.  You have other conditions, such as: ? Diabetes. ? A weak disease-fighting system (immune system). ? Sickle cell disease. ? Gout. ? Injury of the spine. What are the signs or symptoms? Symptoms of this condition include:  Needing to pee right away.  Peeing small amounts often.  Pain or burning when peeing.  Blood in the pee.  Pee that smells bad or not like normal.  Trouble peeing.  Pee that is cloudy.  Fluid coming from the vagina, if you are female.  Pain in the belly or lower back. Other symptoms include:  Vomiting.  Not feeling hungry.  Feeling mixed up (confused). This may be the first symptom in older adults.  Being tired and grouchy (irritable).  A fever.  Watery poop (diarrhea). How is this treated?  Taking antibiotic medicine.  Taking other medicines.  Drinking enough water. In some cases, you may need to see a specialist. Follow these instructions at home: Medicines  Take over-the-counter and prescription medicines only as told by your doctor.  If you were prescribed an antibiotic medicine, take it as told by your doctor. Do not stop taking it even if you start to feel better. General instructions  Make sure you: ? Pee until your bladder is empty. ? Do not hold pee for a long time. ? Empty your bladder after sex. ? Wipe from front to back after peeing or pooping if you are a female. Use each tissue one time when you wipe.  Drink enough fluid to keep your pee pale yellow.  Keep all follow-up visits.   Contact a doctor if:  You  do not get better after 1-2 days.  Your symptoms go away and then come back. Get help right away if:  You have very bad back pain.  You have very bad pain in your lower belly.  You have a fever.  You have chills.  You feeling like you will vomit or you vomit. Summary  A urinary tract infection (UTI) is an infection of any part of the urinary tract.  This condition is caused by germs in your genital area.  There are many risk factors for a UTI.  Treatment includes antibiotic medicines.  Drink enough fluid to  keep your pee pale yellow. This information is not intended to replace advice given to you by your health care provider. Make sure you discuss any questions you have with your health care provider. Document Revised: 11/12/2019 Document Reviewed: 11/12/2019 Elsevier Patient Education  [place].

## 2020-05-23 NOTE — Telephone Encounter (Signed)
-----   Message from Truitt Merle, MD sent at 05/23/2020  2:29 PM EST ----- Please let pt know her K is low, I refilled KCl 19meq daily, and let her take extra one today, thanks   Truitt Merle

## 2020-05-23 NOTE — Progress Notes (Signed)
UA from today shows UTI. No Abx allergies per chart review. The patient's DVT study from last week noted inguinal adenopathy. In the setting of unclear adenopathy, I would like to cover skin infections as well as her UTI. I have sent a prescription for bactrim to her pharmacy. I ordered a culture and sensitivity and will be in contact if her abx needs to be changed based on C&S results.

## 2020-05-23 NOTE — Progress Notes (Signed)
CRITICAL VALUE STICKER  CRITICAL VALUE:HGB 7.7  RECEIVER (on-site recipient of call):sgiles  DATE & TIME NOTIFIED: 1225 05/23/20  MESSENGER (representative from lab):Jay  MD NOTIFIED: Dr. Benay Spice  TIME OF NOTIFICATION:1225  RESPONSE: MD aware

## 2020-05-24 ENCOUNTER — Other Ambulatory Visit: Payer: Self-pay | Admitting: Hematology

## 2020-05-24 LAB — TYPE AND SCREEN
ABO/RH(D): A NEG
Antibody Screen: NEGATIVE
Unit division: 0

## 2020-05-24 LAB — BPAM RBC
Blood Product Expiration Date: 202203032359
ISSUE DATE / TIME: 202202081510
Unit Type and Rh: 600

## 2020-05-24 MED ORDER — ALPRAZOLAM 0.5 MG PO TABS: 0.5000 mg | ORAL_TABLET | Freq: Every evening | ORAL | 0 refills | Status: DC | PRN

## 2020-05-24 NOTE — Progress Notes (Signed)
[place]   Telephone:(336) [phone] [phone]   Clinic Follow up Note   Patient Care Team: [name], MD as PCP - General (Internal Medicine)  Date of Service:  05/26/2020  CHIEF COMPLAINT: F/uof HHT andAnemia   PREVIOUS THERAPY:  -Multiple EGD, small bowel enteroscope, C-scope with APC -Oral and iv amicar were given during hospital stay, no effect -IR embolization ofof the left gastric and short gastric artery branch vessels without evidence of residual flow in the Dieulafoy lesionon 12/10/17 -Avastin on 8/2 and 8/30 at [facility]; starting 12/26/17 continue 5mg /kg q2 weeksuntil 02/20/18, restarted on 04/03/2018 -Tamoxifen started in 10/2017 for HHTstopped 12/10/18 due toDVT  CURRENT THERAPY: -Blood transfusion for severe anemia secondary to GI bleedingas needed -ivferric gluconate every1-2 weeks as neededwith ferritin goal 100-200. Increased to weekly on 07/29/19.Reduced to every 2 weeks on 11/12/19 -RestartedAvastin/Zirabevq2weeks on 04/03/18. Reduced to every 4 weeks starting 09/25/19. Increased to every 2 weeks on 01/06/20. Increased dose to 250mg  on 01/20/20 -Amicar 1g bidstarting4/06/2018. Reduced to only as needed on 12/10/18 due toDVT   INTERVAL HISTORY:  Peggy House is here for a follow up of anemia and HTT. She presents to the clinic alone. She notes she still feels tired. She notes she is able to get help at home by her son. She notes she tries not to do much at home but remains to do stuff at home like chores and ADLs. She also has nurse aid that comes daily to help with some personal care. She notes mild SOB with some activities. She denies chest pain. She notes she feels knots in her lower legs and her legs are sensitive to touch. She notes her last black stool episode was 4 days ago. She notes having BM daily. She notes she felt nauseous last night. She notes her appetite is fair and fluctuates. She will have some days  with out any eating.    REVIEW OF SYSTEMS:   Constitutional: Denies fevers, chills or abnormal weight loss Eyes: Denies blurriness of vision Ears, nose, mouth, throat, and face: Denies mucositis or sore throat Respiratory: Denies cough, dyspnea or wheezes Cardiovascular: Denies palpitation, chest discomfort (+) lower extremity swelling, tender to touch  Gastrointestinal:  Denies nausea, heartburn or change in bowel habits Skin: Denies abnormal skin rashes Lymphatics: Denies new lymphadenopathy or easy bruising Neurological:Denies numbness, tingling or new weaknesses Behavioral/Psych: Mood is stable, no new changes  All other systems were reviewed with the patient and are negative.  MEDICAL HISTORY:  Past Medical History:  Diagnosis Date  . Anxiety   . Arthritis    knnes,back  . GERD (gastroesophageal reflux disease)   . 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 History of swelling of feet   . Hyperlipidemia   . Hypertension   . Major depressive disorder, recurrent episode ([place]) 06/05/2015  . Obesity   . Snores   . 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 ABDOMINAL HYSTERECTOMY    . CARPAL TUNNEL RELEASE  05/13/2011   Procedure: CARPAL TUNNEL RELEASE;  Surgeon: Nita Sells, MD;  Location: [facility];  Service: Orthopedics;  Laterality: Left;  . COLONOSCOPY N/A 03/02/2020   Procedure: COLONOSCOPY;  Surgeon: Ronald Lobo, MD;  Location: [facility];  Service: Endoscopy;  Laterality: N/A;  . COLONOSCOPY WITH PROPOFOL N/A 04/28/2014   Procedure: COLONOSCOPY WITH PROPOFOL;  Surgeon: Cleotis Nipper, MD;  Location:  [facility] ENDOSCOPY;  Service: Endoscopy;  Laterality: N/A;  . DG TOES*L*  2/10   rt  . DILATION AND CURETTAGE OF UTERUS    . ENTEROSCOPY N/A 10/17/2017   Procedure: ENTEROSCOPY;  Surgeon: Otis Brace, MD;  Location: [facility] ENDOSCOPY;   Service: Gastroenterology;  Laterality: N/A;  . ESOPHAGOGASTRODUODENOSCOPY N/A 04/10/2014   Procedure: ESOPHAGOGASTRODUODENOSCOPY (EGD);  Surgeon: [name], MD;  Location: [facility] ENDOSCOPY;  Service: Endoscopy;  Laterality: N/A;  . ESOPHAGOGASTRODUODENOSCOPY N/A 05/10/2017   Procedure: ESOPHAGOGASTRODUODENOSCOPY (EGD);  Surgeon: Ronald Lobo, MD;  Location: [facility] ENDOSCOPY;  Service: Endoscopy;  Laterality: N/A;  . ESOPHAGOGASTRODUODENOSCOPY N/A 09/22/2017   Procedure: ESOPHAGOGASTRODUODENOSCOPY (EGD);  Surgeon: Clarene Essex, MD;  Location: [facility];  Service: Endoscopy;  Laterality: N/A;  bedside  . ESOPHAGOGASTRODUODENOSCOPY N/A 03/02/2020   Procedure: ESOPHAGOGASTRODUODENOSCOPY (EGD);  Surgeon: Ronald Lobo, MD;  Location: [facility];  Service: Endoscopy;  Laterality: N/A;  . ESOPHAGOGASTRODUODENOSCOPY (EGD) WITH PROPOFOL N/A 04/27/2014   Procedure: ESOPHAGOGASTRODUODENOSCOPY (EGD) WITH PROPOFOL;  Surgeon: Cleotis Nipper, MD;  Location: [place];  Service: Endoscopy;  Laterality: N/A;  possible apc  . ESOPHAGOGASTRODUODENOSCOPY (EGD) WITH PROPOFOL N/A 09/30/2017   Procedure: ESOPHAGOGASTRODUODENOSCOPY (EGD) WITH PROPOFOL;  Surgeon: [name], MD;  Location: [place];  Service: Gastroenterology;  Laterality: N/A;  . ESOPHAGOGASTRODUODENOSCOPY (EGD) WITH PROPOFOL N/A 10/01/2017   Procedure: ESOPHAGOGASTRODUODENOSCOPY (EGD) WITH PROPOFOL;  Surgeon: [name], MD;  Location: [place];  Service: Gastroenterology;  Laterality: N/A;  . ESOPHAGOGASTRODUODENOSCOPY (EGD) WITH PROPOFOL N/A 10/08/2017   Procedure: ESOPHAGOGASTRODUODENOSCOPY (EGD) WITH PROPOFOL;  Surgeon: Otis Brace, MD;  Location: [facility];  Service: Gastroenterology;  Laterality: N/A;  . ESOPHAGOGASTRODUODENOSCOPY (EGD) WITH PROPOFOL N/A 10/17/2017   Procedure: ESOPHAGOGASTRODUODENOSCOPY (EGD) WITH PROPOFOL;  Surgeon: Otis Brace, MD;  Location: [facility]OSCOPY;  Service: Gastroenterology;   Laterality: N/A;  . ESOPHAGOGASTRODUODENOSCOPY (EGD) WITH PROPOFOL N/A 10/19/2017   Procedure: ESOPHAGOGASTRODUODENOSCOPY (EGD) WITH PROPOFOL;  Surgeon: Otis Brace, MD;  Location: [facility];  Service: Gastroenterology;  Laterality: N/A;  . ESOPHAGOGASTRODUODENOSCOPY (EGD) WITH PROPOFOL N/A 12/04/2018   Procedure: ESOPHAGOGASTRODUODENOSCOPY (EGD) WITH PROPOFOL;  Surgeon: Wilford Corner, MD;  [facility];  Service: Endoscopy;  Laterality: N/A;  . GIVENS CAPSULE STUDY N/A 10/02/2017   Procedure: GIVENS CAPSULE STUDY;  Surgeon: [name], MD;  Location: [facility];  Service: Gastroenterology;  Laterality: N/A;  . GIVENS CAPSULE STUDY N/A 10/08/2017   Procedure: GIVENS CAPSULE STUDY;  Surgeon: Otis Brace, MD;  Location: [place];  Service: Gastroenterology;  Laterality: N/A;  endoscopic placement of capsule  . GIVENS CAPSULE STUDY N/A 03/02/2020   Procedure: GIVENS CAPSULE STUDY;  Surgeon: Ronald Lobo, MD;  [facility];  Service: Endoscopy;  Laterality: N/A;  . HEMOSTASIS CLIP PLACEMENT  12/04/2018   Procedure: HEMOSTASIS CLIP PLACEMENT;  Surgeon: Wilford Corner, MD;  [facility];  Service: Endoscopy;;  . HOT HEMOSTASIS N/A 04/27/2014   Procedure: HOT HEMOSTASIS (ARGON PLASMA COAGULATION/BICAP);  Surgeon: Cleotis Nipper, MD;  Location: [facility] ENDOSCOPY;  Service: Endoscopy;  Laterality: N/A;  . HOT HEMOSTASIS N/A 09/30/2017   Procedure: HOT HEMOSTASIS (ARGON PLASMA COAGULATION/BICAP);  Surgeon: [name], MD;  Location: [place];  Service: Gastroenterology;  Laterality: N/A;  . HOT HEMOSTASIS N/A 10/01/2017   Procedure: HOT HEMOSTASIS (ARGON PLASMA COAGULATION/BICAP);  Surgeon: [name], MD;  Location: [place];  Service: Gastroenterology;  Laterality: N/A;  . HOT HEMOSTASIS N/A 10/17/2017   Procedure: HOT HEMOSTASIS (ARGON PLASMA COAGULATION/BICAP);  Surgeon: Otis Brace, MD;  Location: [facility] ENDOSCOPY;  Service: Gastroenterology;   Laterality:  N/A;  . HOT HEMOSTASIS N/A 10/19/2017   Procedure: HOT HEMOSTASIS (ARGON PLASMA COAGULATION/BICAP);  Surgeon: Otis Brace, MD;  Location: [facility] ENDOSCOPY;  Service: Gastroenterology;  Laterality: N/A;  . HOT HEMOSTASIS N/A 03/02/2020   Procedure: HOT HEMOSTASIS (ARGON PLASMA COAGULATION/BICAP);  Surgeon: Ronald Lobo, MD;  Location: [facility];  Service: Endoscopy;  Laterality: N/A;  . IR IMAGING GUIDED PORT INSERTION  07/08/2018  . L shoulder Surgery  2011  . POLYPECTOMY  03/02/2020   Procedure: POLYPECTOMY;  Surgeon: Ronald Lobo, MD;  [facility];  Service: Endoscopy;;  . SUBMUCOSAL INJECTION  09/22/2017   Procedure: SUBMUCOSAL INJECTION;  Surgeon: Clarene Essex, MD;  Location: [place];  Service: Endoscopy;;  . SUBMUCOSAL INJECTION  12/04/2018   Procedure: SUBMUCOSAL INJECTION;  Surgeon: Wilford Corner, MD;  [facility];  Service: Endoscopy;;    I have reviewed the social history and family history with the patient and they are unchanged from previous note.  ALLERGIES:  is allergic to feraheme [ferumoxytol], nsaids, tomato, and wasp venom.  MEDICATIONS:  Current Outpatient Medications  Medication Sig Dispense Refill  . Accu-Chek Softclix Lancets lancets Use to check blood sugar before breakfast and before dinner while on steroids 100 each 1  . acitretin (SORIATANE) 10 MG capsule Take 10 mg by mouth daily.    [name] albuterol (PROVENTIL HFA;VENTOLIN HFA) 108 (90 Base) MCG/ACT inhaler Inhale 1-2 puffs into the lungs every 6 (six) hours as needed for wheezing or shortness of breath. 1 Inhaler 3  . ALPRAZolam (XANAX) 0.5 MG tablet Take 1 tablet (0.5 mg total) by mouth at bedtime as needed for anxiety. 30 tablet 0  . Aminocaproic Acid 1000 MG TABS Take 1 tablet (1,000 mg total) by mouth in the morning and at bedtime. TAKE 1 TABLET(1000 MG) BY MOUTH TWICE DAILY 60 tablet 3  . amLODipine (NORVASC) 10 MG tablet TAKE 1 TABLET(10 MG) BY MOUTH DAILY 90  tablet 0  . atorvastatin (LIPITOR) 80 MG tablet TAKE 1 TABLET(80 MG) BY MOUTH DAILY (Patient taking differently: Take 80 mg by mouth daily. TAKE 1 TABLET(80 MG) BY MOUTH DAILY) 30 tablet 3  . cetirizine (ZYRTEC) 10 MG tablet TAKE 1 TABLET(10 MG) BY MOUTH DAILY 90 tablet 1  . diphenoxylate-atropine (LOMOTIL) 2.5-0.025 MG tablet 1 to 2 PO QID prn diarrhea (Patient taking differently: Take 1-2 tablets by mouth 4 (four) times daily as needed for diarrhea or loose stools.) 30 tablet 1  . glucose blood (ACCU-CHEK GUIDE) test strip Check blood sugar 2 times per day while on steroids before breakfast and dinner 50 each 3  . hydrochlorothiazide (HYDRODIURIL) 12.5 MG tablet TAKE 1 TABLET(12.5 MG) BY MOUTH DAILY 90 tablet 1  . lidocaine-prilocaine (EMLA) cream Apply 1 application topically as needed. 30 g 0  . metFORMIN (GLUCOPHAGE) 500 MG tablet Take 1 tablet (500 mg total) by mouth 2 (two) times daily with a meal. 180 tablet 3  . pantoprazole (PROTONIX) 40 MG tablet TAKE 1 TABLET(40 MG) BY MOUTH TWICE DAILY 60 tablet 5  . Podiatric Products (FLEXITOL HEEL BALM) OINT Apply 1 application topically as needed (dry skin).     . potassium chloride (KLOR-CON) 20 MEQ packet Take 20 mEq by mouth daily. 30 packet 1  . prochlorperazine (COMPAZINE) 10 MG tablet Take 1 tablet (10 mg total) by mouth every 6 (six) hours as needed for nausea or vomiting. 30 tablet 1  . sertraline (ZOLOFT) 100 MG tablet TAKE 1 TABLET(100 MG) BY MOUTH DAILY (Patient taking differently: Take 100 mg  by mouth daily.) 90 tablet 1  . sulfamethoxazole-trimethoprim (BACTRIM DS) 800-160 MG tablet Take 1 tablet by mouth 2 (two) times daily. 6 tablet 0   No current facility-administered medications for this visit.   Facility-Administered Medications Ordered in Other Visits  Medication Dose Route Frequency Provider Last Rate Last Admin  . sodium chloride flush (NS) 0.9 % injection 10 mL  10 mL Intracatheter PRN Truitt Merle, MD   10 mL at 05/26/20 1648     PHYSICAL EXAMINATION: ECOG PERFORMANCE STATUS: 3 - Symptomatic, >50% confined to bed BP 138/55, HR 70, RR 18, T 37.2, O2sat 99% on RA  GENERAL:alert, no distress and comfortable SKIN: skin color, texture, turgor are normal, (+) skin hyperpigmentation on b/l LE (from feet to mid calf), and markedly thickened skin on b/l bottom of feet and thickened toe nails likely secondary to fungal infection (as pictured below).  EYES: normal, Conjunctiva are pink and non-injected, sclera clear  NECK: supple, thyroid normal size, non-tender, without nodularity LYMPH:  no palpable lymphadenopathy in the cervical, axillary or inguinal LUNGS: clear to auscultation and percussion with normal breathing effort HEART: regular rate & rhythm and no murmurs (+) lower extremity edema with tenderness with b/l skin darkening of lower legs (as pictured below) ABDOMEN:abdomen soft, non-tender and normal bowel sounds Musculoskeletal:no cyanosis of digits and no clubbing  NEURO: alert & oriented x 3 with fluent speech, no focal motor/sensory deficits EXAM PERFORMED IN INFUSION ROOM         LABORATORY DATA:  I have reviewed the data as listed CBC Latest Ref Rng & Units 05/26/2020 05/23/2020 05/15/2020  WBC 4.0 - 10.5 K/uL 14.1(H) 13.5(H) 16.0(H)  Hemoglobin 12.0 - 15.0 g/dL 8.6(L) 7.7(L) 7.9(L)  Hematocrit 36.0 - 46.0 % 29.7(L) 27.2(L) 27.1(L)  Platelets 150 - 400 K/uL 310 271 402(H)     CMP Latest Ref Rng & Units 05/23/2020 05/15/2020 05/12/2020  Glucose 70 - 99 mg/dL 122(H) 100(H) 112(H)  BUN 6 - 20 mg/dL 8 6 8   Creatinine 0.44 - 1.00 mg/dL 0.76 0.78 0.86  Sodium 135 - 145 mmol/L 140 142 140  Potassium 3.5 - 5.1 mmol/L 3.1(L) 3.5 3.6  Chloride 98 - 111 mmol/L 107 107 108  CO2 22 - 32 mmol/L 24 26 25   Calcium 8.9 - 10.3 mg/dL 9.0 9.1 8.9  Total Protein 6.5 - 8.1 g/dL 7.2 7.3 7.0  Total Bilirubin 0.3 - 1.2 mg/dL 0.3 0.4 <0.2(L)  Alkaline Phos 38 - 126 U/L 75 77 89  AST 15 - 41 U/L 14(L) 16 25  ALT 0 - 44  U/L 8 9 7       RADIOGRAPHIC STUDIES: I have personally reviewed the radiological images as listed and agreed with the findings in the report. No results found.   ASSESSMENT & PLAN:  Peggy House is a 60 y.o. 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 Extensive workup found the pt to have HHT based on her personal and familyhistoryand recurrentAVM GI bleedings. -Sheis s/pIR embolization of the left Gastric and short gastric artery branchvesselswithout evidence of residual flow in the Dieulafoy lesionon 12/10/2017. -She has been on bevacizumabevery2-4 week,treatment has been intermittently held due to large proteinuria. She has been on IV Ferric Gluconate every 1-2 weeks (unless Ferritin >200)and blood transfusions as needed.We repeatedly discussed consistency in treatments to manage her blood counts, she voices  good understanding. -Labs reviewed, WBC 14.1, Hg 8.6, ANC 11.9. Will proceed with IV Ferric Gluconate today.  -Continue weekly labs and Continue Beva (if urine protein <300) and Ferric Gluconate 250mg  (unless Ferritin >200 on last lab) every 1-2 weeks. Due to her recent severe anemia, will give ferric gluconate twice this week   -F/u in 2 weeks.   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 She is also on blood transfusionsas needed.Last Blood transfusion was12/10/2019. Continue prenatal vitamin. -Given recent increase of black stool and Epistaxis, hg at 5.1 today (04/28/20). Will given  2 units of blood over today and tomorrow.   3. Reactive Leukocytosis  -She has mild leukocytosis with dominant neutrophils, likely reactive. Her prior thrombocytosis resolved.  -Mostly stable, did increase to 449K today (04/28/20). Given severe anemia today.   4. 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 She required cauterization for stomach for severe Gi bleeding in 11/2018 -Continue to follow up with her GI, Dr. Cristina Gong -I started heron Amicar 07/17/18. Given DVT she will only take Amicar for severe epistaxis -She had recurrence in GI bleeding this weekend with black stool and mentioned increased epistaxis recently (04/28/20). I strongly encouraged her to contact my office when this occurs.   5.History of right Arm DVT -She had right arm DVT that extended to her right clavicleon 12/07/2018.  -She is not a candidate for anticoagulation due to severe GI bleeding. We stopped Tamoxifen, sheI son low doseAmicar for epistaxis. Will maintain [place] flushes.  6. Smoking cessation -She was smoking 10 cigarettes a day on average before recent hospitalization -I strongly encouraged her to stop smoking completely, she will try.  7. Hypokalemia -Onpotassium chloride 20 mEq daily, continue. Willmonitor  9. HTN, uncontrolled, Recently diagnosed DM -Continue medications. Will monitor on Avastin.  -Continue to f/u with PCP   10. Anxiety  -She has been stressed with family health issues (her husband's passing summer 2021 and son's DM on transplant list). She has been more anxious and started having panic attacks -She is currently on Xanax at 0.5mg  dose to use only as needed. F/u with SW as needed for counseling.   11. B/l LE Edema, Fungal infection of feet, inguinal reactive lymphadenopathy -Stable. Continue compression socksand leg elevation  -She had a recent US of leg for her swelling, no  DVT but she was found to have lymphadenopathy of b/l groin. Not palpable nodes on exam today (05/26/20). She does have skin darkening of her legs which she has had in recent months. She also has fungal infection of her feet which she has been seeing podiatrist for. I recommend she f/u with them as she may need oral antibiotics.  -I discussed her lymphoadenopathy is likely related to her current fungal infection. Will monitor for now    PLAN:  -Proceed with Ferric Gluconate and bevacizumab today  -Continue weekly labs on thursdays with Beva every 2 weeks (if urine protein <300) and Ferric Gluconate 250mg  (unless Ferritin >200) every 1-2 weeks.   -F/u in 3 weeks.    No problem-specific Assessment & Plan notes found for this encounter.   No orders of the defined types were placed in this encounter.  All questions were answered. The patient knows to call the clinic with any problems, questions or concerns. No barriers to learning was detected. The total time spent in the appointment was 30 minutes.     Truitt Merle, MD 05/26/2020   I, Joslyn Devon, am acting as scribe for Truitt Merle, MD.   I have reviewed the above documentation for accuracy and completeness, and I agree with the above.

## 2020-05-25 ENCOUNTER — Other Ambulatory Visit: Payer: Self-pay | Admitting: Student

## 2020-05-25 DIAGNOSIS — I10 Essential (primary) hypertension: Secondary | ICD-10-CM

## 2020-05-25 LAB — URINE CULTURE: Culture: >=100000 — AB

## 2020-05-26 ENCOUNTER — Encounter: Payer: Self-pay | Admitting: Hematology

## 2020-05-26 ENCOUNTER — Ambulatory Visit: Payer: Medicaid Other

## 2020-05-26 ENCOUNTER — Inpatient Hospital Stay: Payer: Medicaid Other

## 2020-05-26 ENCOUNTER — Inpatient Hospital Stay (HOSPITAL_BASED_OUTPATIENT_CLINIC_OR_DEPARTMENT_OTHER): Payer: Medicaid Other | Admitting: Hematology

## 2020-05-26 ENCOUNTER — Other Ambulatory Visit: Payer: Medicaid Other

## 2020-05-26 ENCOUNTER — Other Ambulatory Visit: Payer: Self-pay

## 2020-05-26 VITALS — BP 138/55 | HR 70 | Temp 99.0°F | Resp 18 | Wt 252.8 lb

## 2020-05-26 DIAGNOSIS — D500 Iron deficiency anemia secondary to blood loss (chronic): Secondary | ICD-10-CM | POA: Diagnosis not present

## 2020-05-26 DIAGNOSIS — R808 Other proteinuria: Secondary | ICD-10-CM

## 2020-05-26 DIAGNOSIS — I780 Hereditary hemorrhagic telangiectasia: Secondary | ICD-10-CM

## 2020-05-26 DIAGNOSIS — Z95828 Presence of other vascular implants and grafts: Secondary | ICD-10-CM

## 2020-05-26 DIAGNOSIS — Z5112 Encounter for antineoplastic immunotherapy: Secondary | ICD-10-CM | POA: Diagnosis not present

## 2020-05-26 LAB — CBC WITH DIFFERENTIAL (CANCER CENTER ONLY)
Abs Immature Granulocytes: 0.06 10*3/uL (ref 0.00–0.07)
Basophils Absolute: 0 10*3/uL (ref 0.0–0.1)
Basophils Relative: 0 %
Eosinophils Absolute: 0.2 10*3/uL (ref 0.0–0.5)
Eosinophils Relative: 1 %
HCT: 29.7 % — ABNORMAL LOW (ref 36.0–46.0)
Hemoglobin: 8.6 g/dL — ABNORMAL LOW (ref 12.0–15.0)
Immature Granulocytes: 0 %
Lymphocytes Relative: 10 %
Lymphs Abs: 1.4 10*3/uL (ref 0.7–4.0)
MCH: 25.6 pg — ABNORMAL LOW (ref 26.0–34.0)
MCHC: 29 g/dL — ABNORMAL LOW (ref 30.0–36.0)
MCV: 88.4 fL (ref 80.0–100.0)
Monocytes Absolute: 0.6 10*3/uL (ref 0.1–1.0)
Monocytes Relative: 4 %
Neutro Abs: 11.9 10*3/uL — ABNORMAL HIGH (ref 1.7–7.7)
Neutrophils Relative %: 85 %
Platelet Count: 310 10*3/uL (ref 150–400)
RBC: 3.36 MIL/uL — ABNORMAL LOW (ref 3.87–5.11)
RDW: 21.4 % — ABNORMAL HIGH (ref 11.5–15.5)
WBC Count: 14.1 10*3/uL — ABNORMAL HIGH (ref 4.0–10.5)
nRBC: 0 % (ref 0.0–0.2)

## 2020-05-26 LAB — SAMPLE TO BLOOD BANK

## 2020-05-26 LAB — FERRITIN: Ferritin: 247 ng/mL (ref 11–307)

## 2020-05-26 LAB — IRON AND TIBC
Iron: 28 ug/dL — ABNORMAL LOW (ref 41–142)
Saturation Ratios: 12 % — ABNORMAL LOW (ref 21–57)
TIBC: 229 ug/dL — ABNORMAL LOW (ref 236–444)
UIBC: 201 ug/dL (ref 120–384)

## 2020-05-26 MED ORDER — SODIUM CHLORIDE 0.9 % IV SOLN
5.0000 mg/kg | Freq: Once | INTRAVENOUS | Status: AC
  Administered 2020-05-26: 600 mg via INTRAVENOUS
  Filled 2020-05-26: qty 8

## 2020-05-26 MED ORDER — SODIUM CHLORIDE 0.9 % IV SOLN
250.0000 mg | Freq: Once | INTRAVENOUS | Status: AC
  Administered 2020-05-26: 250 mg via INTRAVENOUS
  Filled 2020-05-26: qty 20

## 2020-05-26 MED ORDER — SODIUM CHLORIDE 0.9% FLUSH
10.0000 mL | INTRAVENOUS | Status: DC | PRN
  Administered 2020-05-26: 10 mL
  Filled 2020-05-26: qty 10

## 2020-05-26 MED ORDER — SODIUM CHLORIDE 0.9 % IV SOLN
Freq: Once | INTRAVENOUS | Status: AC
  Filled 2020-05-26: qty 250

## 2020-05-26 MED ORDER — HEPARIN SOD (PORK) LOCK FLUSH 100 UNIT/ML IV SOLN
500.0000 [IU] | Freq: Once | INTRAVENOUS | Status: AC | PRN
  Administered 2020-05-26: 500 [IU]
  Filled 2020-05-26: qty 5

## 2020-05-26 NOTE — Progress Notes (Signed)
Per Dr Feng ok to treat with urine protein 100 

## 2020-05-26 NOTE — Patient Instructions (Addendum)
[facility] Discharge Instructions for Patients Receiving Chemotherapy  Today you received the following chemotherapy agents Bevacizumab(Zirabev)  To help prevent nausea and vomiting after your treatment, we encourage you to take your nausea medication as directed.   If you develop nausea and vomiting that is not controlled by your nausea medication, call the clinic.   BELOW ARE SYMPTOMS THAT SHOULD BE REPORTED IMMEDIATELY:  *FEVER GREATER THAN 100.5 F  *CHILLS WITH OR WITHOUT FEVER  NAUSEA AND VOMITING THAT IS NOT CONTROLLED WITH YOUR NAUSEA MEDICATION  *UNUSUAL SHORTNESS OF BREATH  *UNUSUAL BRUISING OR BLEEDING  TENDERNESS IN MOUTH AND THROAT WITH OR WITHOUT PRESENCE OF ULCERS  *URINARY PROBLEMS  *BOWEL PROBLEMS  UNUSUAL RASH Items with * indicate a potential emergency and should be followed up as soon as possible.  Feel free to call the clinic should you have any questions or concerns. The clinic phone number is [phone].  Please show the [place] at check-in to the Emergency Department and triage nurse.  Iron Sucrose injection What is this medicine? IRON SUCROSE (AHY ern SOO krohs) is an iron complex. Iron is used to make healthy red blood cells, which carry oxygen and nutrients throughout the body. This medicine is used to treat iron deficiency anemia in people with chronic kidney disease. This medicine may be used for other purposes; ask your health care provider or pharmacist if you have questions. COMMON BRAND NAME(S): Venofer What should I tell my health care provider before I take this medicine? They need to know if you have any of these conditions: anemia not caused by low iron levels heart disease high levels of iron in the blood kidney disease liver disease an unusual or allergic reaction to iron, other medicines, foods, dyes, or preservatives pregnant or trying to get pregnant breast-feeding How should I use this  medicine? This medicine is for infusion into a vein. It is given by a health care professional in a hospital or clinic setting. Talk to your pediatrician regarding the use of this medicine in children. While this drug may be prescribed for children as young as 2 years for selected conditions, precautions do apply. Overdosage: If you think you have taken too much of this medicine contact a poison control center or emergency room at once. NOTE: This medicine is only for you. Do not share this medicine with others. What if I miss a dose? It is important not to miss your dose. Call your doctor or health care professional if you are unable to keep an appointment. What may interact with this medicine? Do not take this medicine with any of the following medications: deferoxamine dimercaprol other iron products This medicine may also interact with the following medications: chloramphenicol deferasirox This list may not describe all possible interactions. Give your health care provider a list of all the medicines, herbs, non-prescription drugs, or dietary supplements you use. Also tell them if you smoke, drink alcohol, or use illegal drugs. Some items may interact with your medicine. What should I watch for while using this medicine? Visit your doctor or healthcare professional regularly. Tell your doctor or healthcare professional if your symptoms do not start to get better or if they get worse. You may need blood work done while you are taking this medicine. You may need to follow a special diet. Talk to your doctor. Foods that contain iron include: whole grains/cereals, dried fruits, beans, or peas, leafy green vegetables, and organ meats (liver, kidney). What side effects  may I notice from receiving this medicine? Side effects that you should report to your doctor or health care professional as soon as possible: allergic reactions like skin rash, itching or hives, swelling of the face, lips, or  tongue breathing problems changes in blood pressure cough fast, irregular heartbeat feeling faint or lightheaded, falls fever or chills flushing, sweating, or hot feelings joint or muscle aches/pains seizures swelling of the ankles or feet unusually weak or tired Side effects that usually do not require medical attention (report to your doctor or health care professional if they continue or are bothersome): diarrhea feeling achy headache irritation at site where injected nausea, vomiting stomach upset tiredness This list may not describe all possible side effects. Call your doctor for medical advice about side effects. You may report side effects to FDA at 1-800-FDA-1088. Where should I keep my medicine? This drug is given in a hospital or clinic and will not be stored at home. NOTE: This sheet is a summary. It may not cover all possible information. If you have questions about this medicine, talk to your doctor, pharmacist, or health care provider.  2021 Elsevier/Gold Standard (2011-01-10 17:14:35)

## 2020-05-26 NOTE — Patient Instructions (Signed)

## 2020-05-29 ENCOUNTER — Telehealth: Payer: Self-pay | Admitting: Hematology

## 2020-05-29 NOTE — Telephone Encounter (Signed)
Scheduled follow-up appointments per 2/11 los. Patient is aware.

## 2020-06-02 ENCOUNTER — Inpatient Hospital Stay: Payer: Medicaid Other

## 2020-06-02 ENCOUNTER — Other Ambulatory Visit: Payer: Self-pay

## 2020-06-02 VITALS — BP 158/83 | HR 72 | Temp 98.4°F | Resp 18

## 2020-06-02 DIAGNOSIS — Z5112 Encounter for antineoplastic immunotherapy: Secondary | ICD-10-CM | POA: Diagnosis not present

## 2020-06-02 DIAGNOSIS — D500 Iron deficiency anemia secondary to blood loss (chronic): Secondary | ICD-10-CM

## 2020-06-02 DIAGNOSIS — I780 Hereditary hemorrhagic telangiectasia: Secondary | ICD-10-CM

## 2020-06-02 DIAGNOSIS — Z95828 Presence of other vascular implants and grafts: Secondary | ICD-10-CM

## 2020-06-02 LAB — CBC WITH DIFFERENTIAL (CANCER CENTER ONLY)
Abs Immature Granulocytes: 0.06 10*3/uL (ref 0.00–0.07)
Basophils Absolute: 0.1 10*3/uL (ref 0.0–0.1)
Basophils Relative: 0 %
Eosinophils Absolute: 0.1 10*3/uL (ref 0.0–0.5)
Eosinophils Relative: 1 %
HCT: 26.9 % — ABNORMAL LOW (ref 36.0–46.0)
Hemoglobin: 7.8 g/dL — ABNORMAL LOW (ref 12.0–15.0)
Immature Granulocytes: 1 %
Lymphocytes Relative: 13 %
Lymphs Abs: 1.6 10*3/uL (ref 0.7–4.0)
MCH: 26 pg (ref 26.0–34.0)
MCHC: 29 g/dL — ABNORMAL LOW (ref 30.0–36.0)
MCV: 89.7 fL (ref 80.0–100.0)
Monocytes Absolute: 0.5 10*3/uL (ref 0.1–1.0)
Monocytes Relative: 4 %
Neutro Abs: 9.9 10*3/uL — ABNORMAL HIGH (ref 1.7–7.7)
Neutrophils Relative %: 81 %
Platelet Count: 321 10*3/uL (ref 150–400)
RBC: 3 MIL/uL — ABNORMAL LOW (ref 3.87–5.11)
RDW: 22.3 % — ABNORMAL HIGH (ref 11.5–15.5)
WBC Count: 12.2 10*3/uL — ABNORMAL HIGH (ref 4.0–10.5)
nRBC: 0 % (ref 0.0–0.2)

## 2020-06-02 LAB — CMP (CANCER CENTER ONLY)
ALT: 7 U/L (ref 0–44)
AST: 15 U/L (ref 15–41)
Albumin: 3.2 g/dL — ABNORMAL LOW (ref 3.5–5.0)
Alkaline Phosphatase: 77 U/L (ref 38–126)
Anion gap: 11 (ref 5–15)
BUN: 8 mg/dL (ref 6–20)
CO2: 23 mmol/L (ref 22–32)
Calcium: 8.9 mg/dL (ref 8.9–10.3)
Chloride: 108 mmol/L (ref 98–111)
Creatinine: 0.79 mg/dL (ref 0.44–1.00)
GFR, Estimated: >60 mL/min (ref >60)
Glucose, Bld: 123 mg/dL — ABNORMAL HIGH (ref 70–99)
Potassium: 3.9 mmol/L (ref 3.5–5.1)
Sodium: 142 mmol/L (ref 135–145)
Total Bilirubin: 0.2 mg/dL — ABNORMAL LOW (ref 0.3–1.2)
Total Protein: 7.4 g/dL (ref 6.5–8.1)

## 2020-06-02 LAB — SAMPLE TO BLOOD BANK

## 2020-06-02 MED ORDER — SODIUM CHLORIDE 0.9 % IV SOLN
Freq: Once | INTRAVENOUS | Status: AC
  Filled 2020-06-02: qty 250

## 2020-06-02 MED ORDER — ALTEPLASE 2 MG IJ SOLR
2.0000 mg | Freq: Once | INTRAMUSCULAR | Status: DC | PRN
  Filled 2020-06-02: qty 2

## 2020-06-02 MED ORDER — SODIUM CHLORIDE 0.9% FLUSH
10.0000 mL | Freq: Once | INTRAVENOUS | Status: AC
  Administered 2020-06-02: 10 mL
  Filled 2020-06-02: qty 10

## 2020-06-02 MED ORDER — HEPARIN SOD (PORK) LOCK FLUSH 100 UNIT/ML IV SOLN
500.0000 [IU] | Freq: Once | INTRAVENOUS | Status: AC
  Administered 2020-06-02: 500 [IU]
  Filled 2020-06-02: qty 5

## 2020-06-02 MED ORDER — SODIUM CHLORIDE 0.9 % IV SOLN
250.0000 mg | Freq: Once | INTRAVENOUS | Status: AC
  Administered 2020-06-02: 250 mg via INTRAVENOUS
  Filled 2020-06-02: qty 20

## 2020-06-02 NOTE — Patient Instructions (Signed)
Sodium Ferric Gluconate Complex injection What is this medicine? SODIUM FERRIC GLUCONATE COMPLEX (SOE dee um FER ik GLOO koe nate KOM pleks) is an iron replacement. It is used with epoetin therapy to treat low iron levels in patients who are receiving hemodialysis. This medicine may be used for other purposes; ask your health care provider or pharmacist if you have questions. COMMON BRAND NAME(S): Ferrlecit, Nulecit What should I tell my health care provider before I take this medicine? They need to know if you have any of the following conditions:  anemia that is not from iron deficiency  high levels of iron in the body  an unusual or allergic reaction to iron, benzyl alcohol, other medicines, foods, dyes, or preservatives  pregnant or are trying to become pregnant  breast-feeding How should I use this medicine? This medicine is for infusion into a vein. It is given by a health care professional in a hospital or clinic setting. Talk to your pediatrician regarding the use of this medicine in children. While this drug may be prescribed for children as young as 6 years old for selected conditions, precautions do apply. Overdosage: If you think you have taken too much of this medicine contact a poison control center or emergency room at once. NOTE: This medicine is only for you. Do not share this medicine with others. What if I miss a dose? It is important not to miss your dose. Call your doctor or health care professional if you are unable to keep an appointment. What may interact with this medicine? Do not take this medicine with any of the following medications:  deferoxamine  dimercaprol  other iron products This medicine may also interact with the following medications:  chloramphenicol  deferasirox  medicine for blood pressure like enalapril This list may not describe all possible interactions. Give your health care provider a list of all the medicines, herbs,  non-prescription drugs, or dietary supplements you use. Also tell them if you smoke, drink alcohol, or use illegal drugs. Some items may interact with your medicine. What should I watch for while using this medicine? Your condition will be monitored carefully while you are receiving this medicine. Visit your doctor for check-ups as directed. What side effects may I notice from receiving this medicine? Side effects that you should report to your doctor or health care professional as soon as possible:  allergic reactions like skin rash, itching or hives, swelling of the face, lips, or tongue  breathing problems  changes in hearing  changes in vision  chills, flushing, or sweating  fast, irregular heartbeat  feeling faint or lightheaded, falls  fever, flu-like symptoms  high or low blood pressure  pain, tingling, numbness in the hands or feet  severe pain in the chest, back, flanks, or groin  swelling of the ankles, feet, hands  trouble passing urine or change in the amount of urine  unusually weak or tired Side effects that usually do not require medical attention (report to your doctor or health care professional if they continue or are bothersome):  cramps  dark colored stools  diarrhea  headache  nausea, vomiting  stomach upset This list may not describe all possible side effects. Call your doctor for medical advice about side effects. You may report side effects to FDA at 1-800-FDA-1088. Where should I keep my medicine? This drug is given in a hospital or clinic and will not be stored at home. NOTE: This sheet is a summary. It may not cover all   possible information. If you have questions about this medicine, talk to your doctor, pharmacist, or health care provider.  2021 Elsevier/Gold Standard (2007-12-02 15:58:57)    Blood Transfusion, Adult, Care After This sheet gives you information about how to care for yourself after your procedure. Your doctor may  also give you more specific instructions. If you have problems or questions, contact your doctor. What can I expect after the procedure? After the procedure, it is common to have:  Bruising and soreness at the IV site.  A fever or chills on the day of the procedure. This may be your body's response to the new blood cells received.  A headache. Follow these instructions at home: Insertion site care  Follow instructions from your doctor about how to take care of your insertion site. This is where an IV tube was put into your vein. Make sure you: ? Wash your hands with soap and water before and after you change your bandage (dressing). If you cannot use soap and water, use hand sanitizer. ? Change your bandage as told by your doctor.  Check your insertion site every day for signs of infection. Check for: ? Redness, swelling, or pain. ? Bleeding from the site. ? Warmth. ? Pus or a bad smell.      General instructions  Take over-the-counter and prescription medicines only as told by your doctor.  Rest as told by your doctor.  Go back to your normal activities as told by your doctor.  Keep all follow-up visits as told by your doctor. This is important. Contact a doctor if:  You have itching or red, swollen areas of skin (hives).  You feel worried or nervous (anxious).  You feel weak after doing your normal activities.  You have redness, swelling, warmth, or pain around the insertion site.  You have blood coming from the insertion site, and the blood does not stop with pressure.  You have pus or a bad smell coming from the insertion site. Get help right away if:  You have signs of a serious reaction. This may be coming from an allergy or the body's defense system (immune system). Signs include: ? Trouble breathing or shortness of breath. ? Swelling of the face or feeling warm (flushed). ? Fever or chills. ? Head, chest, or back pain. ? Dark pee (urine) or blood in the  pee. ? Widespread rash. ? Fast heartbeat. ? Feeling dizzy or light-headed. You may receive your blood transfusion in an outpatient setting. If so, you will be told whom to contact to report any reactions. These symptoms may be an emergency. Do not wait to see if the symptoms will go away. Get medical help right away. Call your local emergency services (911 in the [place] Do not drive yourself to the hospital. Summary  Bruising and soreness at the IV site are common.  Check your insertion site every day for signs of infection.  Rest as told by your doctor. Go back to your normal activities as told by your doctor.  Get help right away if you have signs of a serious reaction. This information is not intended to replace advice given to you by your health care provider. Make sure you discuss any questions you have with your health care provider. Document Revised: 09/24/2018 Document Reviewed: 09/24/2018 Elsevier Patient Education  2021 [employer].   Urinary Tract Infection, Adult A urinary tract infection (UTI) is an infection of any part of the urinary tract. The urinary tract includes:  The  kidneys.  The ureters.  The bladder.  The urethra. These organs make, store, and get rid of pee (urine) in the body. What are the causes? This infection is caused by germs (bacteria) in your genital area. These germs grow and cause swelling (inflammation) of your urinary tract. What increases the risk? The following factors may make you more likely to develop this condition:  Using a small, thin tube (catheter) to drain pee.  Not being able to control when you pee or poop (incontinence).  Being female. If you are female, these things can increase the risk: ? Using these methods to prevent pregnancy:  A medicine that kills sperm (spermicide).  A device that blocks sperm (diaphragm). ? Having low levels of a female hormone (estrogen). ? Being pregnant. You are more likely to develop  this condition if:  You have genes that add to your risk.  You are sexually active.  You take antibiotic medicines.  You have trouble peeing because of: ? A prostate that is bigger than normal, if you are female. ? A blockage in the part of your body that drains pee from the bladder. ? A kidney stone. ? A nerve condition that affects your bladder. ? Not getting enough to drink. ? Not peeing often enough.  You have other conditions, such as: ? Diabetes. ? A weak disease-fighting system (immune system). ? Sickle cell disease. ? Gout. ? Injury of the spine. What are the signs or symptoms? Symptoms of this condition include:  Needing to pee right away.  Peeing small amounts often.  Pain or burning when peeing.  Blood in the pee.  Pee that smells bad or not like normal.  Trouble peeing.  Pee that is cloudy.  Fluid coming from the vagina, if you are female.  Pain in the belly or lower back. Other symptoms include:  Vomiting.  Not feeling hungry.  Feeling mixed up (confused). This may be the first symptom in older adults.  Being tired and grouchy (irritable).  A fever.  Watery poop (diarrhea). How is this treated?  Taking antibiotic medicine.  Taking other medicines.  Drinking enough water. In some cases, you may need to see a specialist. Follow these instructions at home: Medicines  Take over-the-counter and prescription medicines only as told by your doctor.  If you were prescribed an antibiotic medicine, take it as told by your doctor. Do not stop taking it even if you start to feel better. General instructions  Make sure you: ? Pee until your bladder is empty. ? Do not hold pee for a long time. ? Empty your bladder after sex. ? Wipe from front to back after peeing or pooping if you are a female. Use each tissue one time when you wipe.  Drink enough fluid to keep your pee pale yellow.  Keep all follow-up visits.   Contact a doctor if:  You  do not get better after 1-2 days.  Your symptoms go away and then come back. Get help right away if:  You have very bad back pain.  You have very bad pain in your lower belly.  You have a fever.  You have chills.  You feeling like you will vomit or you vomit. Summary  A urinary tract infection (UTI) is an infection of any part of the urinary tract.  This condition is caused by germs in your genital area.  There are many risk factors for a UTI.  Treatment includes antibiotic medicines.  Drink enough fluid to  keep your pee pale yellow. This information is not intended to replace advice given to you by your health care provider. Make sure you discuss any questions you have with your health care provider. Document Revised: 11/12/2019 Document Reviewed: 11/12/2019 Elsevier Patient Education  [employer].

## 2020-06-02 NOTE — Patient Instructions (Signed)
Implanted Port Insertion, Care After This sheet gives you information about how to care for yourself after your procedure. Your health care provider may also give you more specific instructions. If you have problems or questions, contact your health care provider. What can I expect after the procedure? After the procedure, it is common to have:  Discomfort at the port insertion site.  Bruising on the skin over the port. This should improve over 3-4 days. Follow these instructions at home: Port care  After your port is placed, you will get a manufacturer's information card. The card has information about your port. Keep this card with you at all times.  Take care of the port as told by your health care provider. Ask your health care provider if you or a family member can get training for taking care of the port at home. A home health care nurse may also take care of the port.  Make sure to remember what type of port you have. Incision care  Follow instructions from your health care provider about how to take care of your port insertion site. Make sure you: ? Wash your hands with soap and water before and after you change your bandage (dressing). If soap and water are not available, use hand sanitizer. ? Change your dressing as told by your health care provider. ? Leave stitches (sutures), skin glue, or adhesive strips in place. These skin closures may need to stay in place for 2 weeks or longer. If adhesive strip edges start to loosen and curl up, you may trim the loose edges. Do not remove adhesive strips completely unless your health care provider tells you to do that.  Check your port insertion site every day for signs of infection. Check for: ? Redness, swelling, or pain. ? Fluid or blood. ? Warmth. ? Pus or a bad smell.      Activity  Return to your normal activities as told by your health care provider. Ask your health care provider what activities are safe for you.  Do not  lift anything that is heavier than 10 lb (4.5 kg), or the limit that you are told, until your health care provider says that it is safe. General instructions  Take over-the-counter and prescription medicines only as told by your health care provider.  Do not take baths, swim, or use a hot tub until your health care provider approves. Ask your health care provider if you may take showers. You may only be allowed to take sponge baths.  Do not drive for 24 hours if you were given a sedative during your procedure.  Wear a medical alert bracelet in case of an emergency. This will tell any health care providers that you have a port.  Keep all follow-up visits as told by your health care provider. This is important. Contact a health care provider if:  You cannot flush your port with saline as directed, or you cannot draw blood from the port.  You have a fever or chills.  You have redness, swelling, or pain around your port insertion site.  You have fluid or blood coming from your port insertion site.  Your port insertion site feels warm to the touch.  You have pus or a bad smell coming from the port insertion site. Get help right away if:  You have chest pain or shortness of breath.  You have bleeding from your port that you cannot control. Summary  Take care of the port as told by your   health care provider. Keep the manufacturer's information card with you at all times.  Change your dressing as told by your health care provider.  Contact a health care provider if you have a fever or chills or if you have redness, swelling, or pain around your port insertion site.  Keep all follow-up visits as told by your health care provider. This information is not intended to replace advice given to you by your health care provider. Make sure you discuss any questions you have with your health care provider. Document Revised: 10/28/2017 Document Reviewed: 10/28/2017 Elsevier Patient Education   2021 Elsevier Inc.  

## 2020-06-06 ENCOUNTER — Other Ambulatory Visit: Payer: Self-pay

## 2020-06-06 ENCOUNTER — Telehealth: Payer: Self-pay

## 2020-06-06 DIAGNOSIS — D500 Iron deficiency anemia secondary to blood loss (chronic): Secondary | ICD-10-CM

## 2020-06-06 NOTE — Telephone Encounter (Signed)
[name] called stating that her stool is starting to turn black.  She is asymptomatic.  I encouraged her to keep her appts on Friday.  I instructed her to go to the ED should her stool become maroon or red, dyspnea or chest pain.  She verbalized understanding.

## 2020-06-09 ENCOUNTER — Other Ambulatory Visit: Payer: Self-pay

## 2020-06-09 ENCOUNTER — Inpatient Hospital Stay: Payer: Medicaid Other

## 2020-06-09 VITALS — BP 134/71 | HR 70 | Temp 98.5°F | Resp 100

## 2020-06-09 DIAGNOSIS — D500 Iron deficiency anemia secondary to blood loss (chronic): Secondary | ICD-10-CM

## 2020-06-09 DIAGNOSIS — Z5112 Encounter for antineoplastic immunotherapy: Secondary | ICD-10-CM | POA: Diagnosis not present

## 2020-06-09 DIAGNOSIS — Z95828 Presence of other vascular implants and grafts: Secondary | ICD-10-CM

## 2020-06-09 DIAGNOSIS — I780 Hereditary hemorrhagic telangiectasia: Secondary | ICD-10-CM

## 2020-06-09 DIAGNOSIS — R808 Other proteinuria: Secondary | ICD-10-CM

## 2020-06-09 LAB — CMP (CANCER CENTER ONLY)
ALT: 7 U/L (ref 0–44)
AST: 11 U/L — ABNORMAL LOW (ref 15–41)
Albumin: 3.2 g/dL — ABNORMAL LOW (ref 3.5–5.0)
Alkaline Phosphatase: 71 U/L (ref 38–126)
Anion gap: 9 (ref 5–15)
BUN: 10 mg/dL (ref 6–20)
CO2: 24 mmol/L (ref 22–32)
Calcium: 9.1 mg/dL (ref 8.9–10.3)
Chloride: 107 mmol/L (ref 98–111)
Creatinine: 0.78 mg/dL (ref 0.44–1.00)
GFR, Estimated: >60 mL/min (ref >60)
Glucose, Bld: 104 mg/dL — ABNORMAL HIGH (ref 70–99)
Potassium: 3.6 mmol/L (ref 3.5–5.1)
Sodium: 140 mmol/L (ref 135–145)
Total Bilirubin: <0.2 mg/dL — ABNORMAL LOW (ref 0.3–1.2)
Total Protein: 7.1 g/dL (ref 6.5–8.1)

## 2020-06-09 LAB — CBC WITH DIFFERENTIAL (CANCER CENTER ONLY)
Abs Immature Granulocytes: 0.04 10*3/uL (ref 0.00–0.07)
Basophils Absolute: 0 10*3/uL (ref 0.0–0.1)
Basophils Relative: 0 %
Eosinophils Absolute: 0.1 10*3/uL (ref 0.0–0.5)
Eosinophils Relative: 1 %
HCT: 24.3 % — ABNORMAL LOW (ref 36.0–46.0)
Hemoglobin: 6.9 g/dL — CL (ref 12.0–15.0)
Immature Granulocytes: 0 %
Lymphocytes Relative: 13 %
Lymphs Abs: 1.5 10*3/uL (ref 0.7–4.0)
MCH: 26 pg (ref 26.0–34.0)
MCHC: 28.4 g/dL — ABNORMAL LOW (ref 30.0–36.0)
MCV: 91.7 fL (ref 80.0–100.0)
Monocytes Absolute: 0.6 10*3/uL (ref 0.1–1.0)
Monocytes Relative: 5 %
Neutro Abs: 8.8 10*3/uL — ABNORMAL HIGH (ref 1.7–7.7)
Neutrophils Relative %: 81 %
Platelet Count: 298 10*3/uL (ref 150–400)
RBC: 2.65 MIL/uL — ABNORMAL LOW (ref 3.87–5.11)
RDW: 22.7 % — ABNORMAL HIGH (ref 11.5–15.5)
WBC Count: 11.1 10*3/uL — ABNORMAL HIGH (ref 4.0–10.5)
nRBC: 0.2 % (ref 0.0–0.2)

## 2020-06-09 LAB — SAMPLE TO BLOOD BANK

## 2020-06-09 LAB — TOTAL PROTEIN, URINE DIPSTICK: Protein, ur: 300 mg/dL — AB

## 2020-06-09 LAB — PREPARE RBC (CROSSMATCH)

## 2020-06-09 MED ORDER — SODIUM CHLORIDE 0.9% FLUSH
10.0000 mL | Freq: Once | INTRAVENOUS | Status: AC
  Administered 2020-06-09: 10 mL
  Filled 2020-06-09: qty 10

## 2020-06-09 MED ORDER — SODIUM CHLORIDE 0.9 % IV SOLN
250.0000 mg | Freq: Once | INTRAVENOUS | Status: AC
  Administered 2020-06-09: 250 mg via INTRAVENOUS
  Filled 2020-06-09: qty 20

## 2020-06-09 NOTE — Progress Notes (Signed)
Pt discharged in no apparent distress. Pt left ambulatory without assistance. Pt aware of discharge instructions and verbalized understanding and had no further questions.  

## 2020-06-09 NOTE — Progress Notes (Addendum)
Proceed with Ferrlecit administration today per MD. Urine protein results 300 - Hold Avastin today. Will R/S for 06/16/20.  Raul Del Fenwood, [place], BCPS, BCOP 06/09/2020 3:11 PM

## 2020-06-09 NOTE — Progress Notes (Signed)
Per Dr Truitt Merle will hold avastin today, may give it next Friday due to urine protein.

## 2020-06-10 ENCOUNTER — Inpatient Hospital Stay: Payer: Medicaid Other

## 2020-06-10 ENCOUNTER — Other Ambulatory Visit: Payer: Self-pay

## 2020-06-10 DIAGNOSIS — Z5112 Encounter for antineoplastic immunotherapy: Secondary | ICD-10-CM | POA: Diagnosis not present

## 2020-06-10 DIAGNOSIS — D500 Iron deficiency anemia secondary to blood loss (chronic): Secondary | ICD-10-CM

## 2020-06-10 MED ORDER — DIPHENHYDRAMINE HCL 25 MG PO CAPS
25.0000 mg | ORAL_CAPSULE | Freq: Once | ORAL | Status: AC
  Administered 2020-06-10: 25 mg via ORAL

## 2020-06-10 MED ORDER — ACETAMINOPHEN 325 MG PO TABS
650.0000 mg | ORAL_TABLET | Freq: Once | ORAL | Status: AC
  Administered 2020-06-10: 650 mg via ORAL

## 2020-06-10 MED ORDER — DIPHENHYDRAMINE HCL 25 MG PO CAPS
ORAL_CAPSULE | ORAL | Status: AC
  Filled 2020-06-10: qty 1

## 2020-06-10 MED ORDER — SODIUM CHLORIDE 0.9% IV SOLUTION
250.0000 mL | Freq: Once | INTRAVENOUS | Status: AC
  Administered 2020-06-10: 250 mL via INTRAVENOUS
  Filled 2020-06-10: qty 250

## 2020-06-10 MED ORDER — ACETAMINOPHEN 325 MG PO TABS
ORAL_TABLET | ORAL | Status: AC
  Filled 2020-06-10: qty 2

## 2020-06-10 MED ORDER — SODIUM CHLORIDE 0.9% FLUSH
10.0000 mL | INTRAVENOUS | Status: AC | PRN
  Administered 2020-06-10: 10 mL
  Filled 2020-06-10: qty 10

## 2020-06-10 MED ORDER — HEPARIN SOD (PORK) LOCK FLUSH 100 UNIT/ML IV SOLN
500.0000 [IU] | Freq: Every day | INTRAVENOUS | Status: AC | PRN
  Administered 2020-06-10: 500 [IU]
  Filled 2020-06-10: qty 5

## 2020-06-10 NOTE — Patient Instructions (Signed)

## 2020-06-11 LAB — BPAM RBC
Blood Product Expiration Date: 202203132359
Blood Product Expiration Date: 202203192359
ISSUE DATE / TIME: 202202260955
ISSUE DATE / TIME: 202202260955
Unit Type and Rh: 600
Unit Type and Rh: 600

## 2020-06-11 LAB — TYPE AND SCREEN
ABO/RH(D): A NEG
Antibody Screen: NEGATIVE
Unit division: 0
Unit division: 0

## 2020-06-12 LAB — FERRITIN: Ferritin: 178 ng/mL (ref 11–307)

## 2020-06-14 ENCOUNTER — Encounter: Payer: Self-pay | Admitting: Internal Medicine

## 2020-06-14 ENCOUNTER — Ambulatory Visit: Payer: Medicaid Other | Admitting: Internal Medicine

## 2020-06-14 ENCOUNTER — Other Ambulatory Visit: Payer: Self-pay

## 2020-06-14 VITALS — BP 145/78 | HR 79 | Temp 98.2°F | Ht 66.0 in | Wt 253.8 lb

## 2020-06-14 DIAGNOSIS — G4700 Insomnia, unspecified: Secondary | ICD-10-CM

## 2020-06-14 DIAGNOSIS — I780 Hereditary hemorrhagic telangiectasia: Secondary | ICD-10-CM

## 2020-06-14 DIAGNOSIS — Z7189 Other specified counseling: Secondary | ICD-10-CM

## 2020-06-14 DIAGNOSIS — F4329 Adjustment disorder with other symptoms: Secondary | ICD-10-CM | POA: Diagnosis not present

## 2020-06-14 DIAGNOSIS — F331 Major depressive disorder, recurrent, moderate: Secondary | ICD-10-CM

## 2020-06-14 DIAGNOSIS — B351 Tinea unguium: Secondary | ICD-10-CM | POA: Diagnosis not present

## 2020-06-14 DIAGNOSIS — D500 Iron deficiency anemia secondary to blood loss (chronic): Secondary | ICD-10-CM | POA: Diagnosis not present

## 2020-06-14 DIAGNOSIS — M79674 Pain in right toe(s): Secondary | ICD-10-CM

## 2020-06-14 DIAGNOSIS — M79675 Pain in left toe(s): Secondary | ICD-10-CM

## 2020-06-14 DIAGNOSIS — I872 Venous insufficiency (chronic) (peripheral): Secondary | ICD-10-CM | POA: Diagnosis not present

## 2020-06-14 DIAGNOSIS — F4381 Prolonged grief disorder: Secondary | ICD-10-CM

## 2020-06-14 MED ORDER — TRAZODONE HCL 100 MG PO TABS: 100.0000 mg | ORAL_TABLET | Freq: Every day | ORAL | 2 refills | Status: DC

## 2020-06-14 NOTE — Assessment & Plan Note (Addendum)
She has been having increased bilateral lower extremity swelling for the past few months although the left is worse than the right. The left leg feels very tight and burns. 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 She had a doppler US done Feb 1st which did not show any DVT but did show left inguinal lymphadenopathy. She has a history of venous stasis and does not wear compression stockings or elevate her legs. No symptoms of infection. She does receive frequent blood transfusions, although I am not sure if this could lead to unilateral lymphadenopathy. She also has a history of pulmonary hypertension but no JVP present on exam, no shortness of breath or other signs of volume overload.    - will reach out to oncology to discuss lymphadenopathy - hospital compression stockings ordered - reassess in one month if these are working or if we need to order from outside facility - long discussion on importance of elevating legs daily   ADDENDUM: lymphadenopathy thought to be unrelated to transfusions. Recommended treatment of her extensive onychomycosis.

## 2020-06-14 NOTE — Progress Notes (Signed)
[place]   Telephone:(336) [phone] Fax:(336) [phone]   Clinic Follow up Note   Patient Care Team: [name], MD as PCP - General (Internal Medicine)  Date of Service:  06/16/2020  CHIEF COMPLAINT: F/uof HHT andAnemia  PREVIOUS THERAPY:  -Multiple EGD, small bowel enteroscope, C-scope with APC -Oral and iv amicar were given during hospital stay, no effect -IR embolization ofof the left gastric and short gastric artery branch vessels without evidence of residual flow in the Dieulafoy lesionon 12/10/17 -Avastin on 8/2 and 8/30 at [facility]; starting 12/26/17 continue 5mg /kg q2 weeksuntil 02/20/18, restarted on 04/03/2018 -Tamoxifen started in 10/2017 for HHTstopped 12/10/18 due toDVT  CURRENT THERAPY: -Blood transfusion for severe anemia secondary to GI bleedingas needed -ivferric gluconate every1-2 weeks as neededwith ferritin goal 100-200. Increased to weekly on 07/29/19.Reduced to every 2 weeks on 11/12/19 -RestartedAvastin/Zirabevq2weeks on 04/03/18. Reduced to every 4 weeks starting 09/25/19. Increased to every 2 weeks on 01/06/20. Increased dose to 250mg  on 01/20/20 -Amicar 1g bidstarting4/06/2018. Reduced to only as needed on 12/10/18 due toDVT. Restart daily on 06/16/20 due to increased bleeding.    INTERVAL HISTORY:  Peggy House is here for a follow up of anemia. She presents to the clinic alone.  She notes intermittent mild blood in stool, last time 4 days ago. She also notes an episode of epistaxis in the past week. She has not used Amicar in some time.     REVIEW OF SYSTEMS:   Constitutional: Denies fevers, chills or abnormal weight loss Eyes: Denies blurriness of vision Ears, nose, mouth, throat, and face: Denies mucositis or sore throat Respiratory: Denies cough, dyspnea or wheezes Cardiovascular: Denies palpitation, chest discomfort or lower extremity swelling Gastrointestinal:  Denies nausea, heartburn or change in bowel  habits Skin: Denies abnormal skin rashes Lymphatics: Denies new lymphadenopathy or easy bruising Neurological:Denies numbness, tingling or new weaknesses Behavioral/Psych: Mood is stable, no new changes  All other systems were reviewed with the patient and are negative.  MEDICAL HISTORY:  Past Medical History:  Diagnosis Date  . Anxiety   . Arthritis    knnes,back  . GERD (gastroesophageal reflux disease)   . 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 History of swelling of feet   . Hyperlipidemia   . Hypertension   . Major depressive disorder, recurrent episode ([place]) 06/05/2015  . Obesity   . Snores   . 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 ABDOMINAL HYSTERECTOMY    . CARPAL TUNNEL RELEASE  05/13/2011   Procedure: CARPAL TUNNEL RELEASE;  Surgeon: Nita Sells, MD;  Location: [place];  Service: Orthopedics;  Laterality: Left;  . COLONOSCOPY N/A 03/02/2020   Procedure: COLONOSCOPY;  Surgeon: Ronald Lobo, MD;  Location: [facility] ENDOSCOPY;  Service: Endoscopy;  Laterality: N/A;  . COLONOSCOPY WITH PROPOFOL N/A 04/28/2014   Procedure: COLONOSCOPY WITH PROPOFOL;  Surgeon: Cleotis Nipper, MD;  Location: [facility] ENDOSCOPY;  Service: Endoscopy;  Laterality: N/A;  . DG TOES*L*  2/10   rt  . DILATION AND CURETTAGE OF UTERUS    . ENTEROSCOPY N/A 10/17/2017   Procedure: ENTEROSCOPY;  Surgeon: Otis Brace, MD;  Location: [place] ENDOSCOPY;  Service: Gastroenterology;  Laterality: N/A;  . ESOPHAGOGASTRODUODENOSCOPY N/A 04/10/2014   Procedure: ESOPHAGOGASTRODUODENOSCOPY (EGD);  Surgeon: [name], MD;  Location: [facility] ENDOSCOPY;  Service: Endoscopy;  Laterality: N/A;  . ESOPHAGOGASTRODUODENOSCOPY N/A 05/10/2017   Procedure: ESOPHAGOGASTRODUODENOSCOPY (EGD);  Surgeon: Ronald Lobo, MD;  Location[facility] ENDOSCOPY;  Service: Endoscopy;  Laterality: N/A;  .  ESOPHAGOGASTRODUODENOSCOPY N/A 09/22/2017   Procedure: ESOPHAGOGASTRODUODENOSCOPY (EGD);  Surgeon: Clarene Essex, MD;  Location: [place];  Service: Endoscopy;  Laterality: N/A;  bedside  . ESOPHAGOGASTRODUODENOSCOPY N/A 03/02/2020   Procedure: ESOPHAGOGASTRODUODENOSCOPY (EGD);  Surgeon: Ronald Lobo, MD;  Location: [facility];  Service: Endoscopy;  Laterality: N/A;  . ESOPHAGOGASTRODUODENOSCOPY (EGD) WITH PROPOFOL N/A 04/27/2014   Procedure: ESOPHAGOGASTRODUODENOSCOPY (EGD) WITH PROPOFOL;  Surgeon: Cleotis Nipper, MD;  Location: [place];  Service: Endoscopy;  Laterality: N/A;  possible apc  . ESOPHAGOGASTRODUODENOSCOPY (EGD) WITH PROPOFOL N/A 09/30/2017   Procedure: ESOPHAGOGASTRODUODENOSCOPY (EGD) WITH PROPOFOL;  Surgeon: [name], MD;  Location: [place];  Service: Gastroenterology;  Laterality: N/A;  . ESOPHAGOGASTRODUODENOSCOPY (EGD) WITH PROPOFOL N/A 10/01/2017   Procedure: ESOPHAGOGASTRODUODENOSCOPY (EGD) WITH PROPOFOL;  Surgeon: [name], MD;  Location: [place];  Service: Gastroenterology;  Laterality: N/A;  . ESOPHAGOGASTRODUODENOSCOPY (EGD) WITH PROPOFOL N/A 10/08/2017   Procedure: ESOPHAGOGASTRODUODENOSCOPY (EGD) WITH PROPOFOL;  Surgeon: Otis Brace, MD;  Location: [facility] ENDOSCOPY;  Service: Gastroenterology;  Laterality: N/A;  . ESOPHAGOGASTRODUODENOSCOPY (EGD) WITH PROPOFOL N/A 10/17/2017   Procedure: ESOPHAGOGASTRODUODENOSCOPY (EGD) WITH PROPOFOL;  Surgeon: Otis Brace, MD;  Location: [facility] ENDOSCOPY;  Service: Gastroenterology;  Laterality: N/A;  . ESOPHAGOGASTRODUODENOSCOPY (EGD) WITH PROPOFOL N/A 10/19/2017   Procedure: ESOPHAGOGASTRODUODENOSCOPY (EGD) WITH PROPOFOL;  Surgeon: Otis Brace, MD;  Location: [facility] ENDOSCOPY;  Service: Gastroenterology;  Laterality: N/A;  . ESOPHAGOGASTRODUODENOSCOPY (EGD) WITH PROPOFOL N/A 12/04/2018   Procedure: ESOPHAGOGASTRODUODENOSCOPY (EGD) WITH PROPOFOL;  Surgeon: Wilford Corner, MD;  Location: [facility] ENDOSCOPY;   Service: Endoscopy;  Laterality: N/A;  . GIVENS CAPSULE STUDY N/A 10/02/2017   Procedure: GIVENS CAPSULE STUDY;  Surgeon: [name], MD;  Location: [place];  Service: Gastroenterology;  Laterality: N/A;  . GIVENS CAPSULE STUDY N/A 10/08/2017   Procedure: GIVENS CAPSULE STUDY;  Surgeon: Otis Brace, MD;  Location: [place];  Service: Gastroenterology;  Laterality: N/A;  endoscopic placement of capsule  . GIVENS CAPSULE STUDY N/A 03/02/2020   Procedure: GIVENS CAPSULE STUDY;  Surgeon: Ronald Lobo, MD;  Location: [facility]OS[facility];  Service: Endoscopy;  Laterality: N/A;  . HEMOSTASIS CLIP PLACEMENT  12/04/2018   Procedure: HEMOSTASIS CLIP PLACEMENT;  Surgeon: Wilford Corner, MD;  Location[facility];  Service: Endoscopy;;  . HOT HEMOSTASIS N/A 04/27/2014   Procedure: HOT HEMOSTASIS (ARGON PLASMA COAGULATION/BICAP);  Surgeon: Cleotis Nipper, MD;  Location: [facility] ENDOSCOPY;  Service: Endoscopy;  Laterality: N/A;  . HOT HEMOSTASIS N/A 09/30/2017   Procedure: HOT HEMOSTASIS (ARGON PLASMA COAGULATION/BICAP);  Surgeon: [name], MD;  Location: [place];  Service: Gastroenterology;  Laterality: N/A;  . HOT HEMOSTASIS N/A 10/01/2017   Procedure: HOT HEMOSTASIS (ARGON PLASMA COAGULATION/BICAP);  Surgeon: [name], MD;  Location: [place];  Service: Gastroenterology;  Laterality: N/A;  . HOT HEMOSTASIS N/A 10/17/2017   Procedure: HOT HEMOSTASIS (ARGON PLASMA COAGULATION/BICAP);  Surgeon: Otis Brace, MD;  Location: [facility] ENDOSCOPY;  Service: Gastroenterology;  Laterality: N/A;  . HOT HEMOSTASIS N/A 10/19/2017   Procedure: HOT HEMOSTASIS (ARGON PLASMA COAGULATION/BICAP);  Surgeon: Otis Brace, MD;  Location: [facility] ENDOSCOPY;  Service: Gastroenterology;  Laterality: N/A;  . HOT HEMOSTASIS N/A 03/02/2020   Procedure: HOT HEMOSTASIS (ARGON PLASMA COAGULATION/BICAP);  Surgeon: Ronald Lobo, MD;  Location: [facility];  Service: Endoscopy;  Laterality: N/A;  . IR IMAGING GUIDED  PORT INSERTION  07/08/2018  . L shoulder Surgery  2011  . POLYPECTOMY  03/02/2020   Procedure: POLYPECTOMY;  Surgeon: Buccini,  Herbie Baltimore, MD;  Location: [facility];  Service: Endoscopy;;  . SUBMUCOSAL INJECTION  09/22/2017   Procedure: SUBMUCOSAL INJECTION;  Surgeon: Clarene Essex, MD;  Location: [facility] ENDOSCOPY;  Service: Endoscopy;;  . SUBMUCOSAL INJECTION  12/04/2018   Procedure: SUBMUCOSAL INJECTION;  Surgeon: Wilford Corner, MD;  Location: [facility] ENDOSCOPY;  Service: Endoscopy;;    I have reviewed the social history and family history with the patient and they are unchanged from previous note.  ALLERGIES:  is allergic to feraheme [ferumoxytol], nsaids, tomato, and wasp venom.  MEDICATIONS:  Current Outpatient Medications  Medication Sig Dispense Refill  . Accu-Chek Softclix Lancets lancets Use to check blood sugar before breakfast and before dinner while on steroids 100 each 1  . acitretin (SORIATANE) 10 MG capsule Take 10 mg by mouth daily.    [name] albuterol (PROVENTIL HFA;VENTOLIN HFA) 108 (90 Base) MCG/ACT inhaler Inhale 1-2 puffs into the lungs every 6 (six) hours as needed for wheezing or shortness of breath. 1 Inhaler 3  . ALPRAZolam (XANAX) 0.5 MG tablet Take 1 tablet (0.5 mg total) by mouth at bedtime as needed for anxiety. 30 tablet 0  . Aminocaproic Acid 1000 MG TABS Take 1 tablet (1,000 mg total) by mouth in the morning and at bedtime. TAKE 1 TABLET(1000 MG) BY MOUTH TWICE DAILY 60 tablet 3  . amLODipine (NORVASC) 10 MG tablet TAKE 1 TABLET(10 MG) BY MOUTH DAILY 90 tablet 0  . atorvastatin (LIPITOR) 80 MG tablet TAKE 1 TABLET(80 MG) BY MOUTH DAILY (Patient taking differently: Take 80 mg by mouth daily. TAKE 1 TABLET(80 MG) BY MOUTH DAILY) 30 tablet 3  . cetirizine (ZYRTEC) 10 MG tablet TAKE 1 TABLET(10 MG) BY MOUTH DAILY 90 tablet 1  . diphenoxylate-atropine (LOMOTIL) 2.5-0.025 MG tablet 1 to 2 PO QID prn diarrhea (Patient taking differently: Take 1-2 tablets by mouth 4 (four) times daily  as needed for diarrhea or loose stools.) 30 tablet 1  . glucose blood (ACCU-CHEK GUIDE) test strip Check blood sugar 2 times per day while on steroids before breakfast and dinner 50 each 3  . hydrochlorothiazide (HYDRODIURIL) 12.5 MG tablet TAKE 1 TABLET(12.5 MG) BY MOUTH DAILY 90 tablet 1  . lidocaine-prilocaine (EMLA) cream Apply 1 application topically as needed. 30 g 0  . metFORMIN (GLUCOPHAGE) 500 MG tablet Take 1 tablet (500 mg total) by mouth 2 (two) times daily with a meal. 180 tablet 3  . pantoprazole (PROTONIX) 40 MG tablet TAKE 1 TABLET(40 MG) BY MOUTH TWICE DAILY 60 tablet 5  . Podiatric Products (FLEXITOL HEEL BALM) OINT Apply 1 application topically as needed (dry skin).     . potassium chloride (KLOR-CON) 20 MEQ packet Take 20 mEq by mouth daily. 30 packet 1  . prochlorperazine (COMPAZINE) 10 MG tablet Take 1 tablet (10 mg total) by mouth every 6 (six) hours as needed for nausea or vomiting. 30 tablet 1  . sertraline (ZOLOFT) 100 MG tablet TAKE 1 TABLET(100 MG) BY MOUTH DAILY (Patient taking differently: Take 100 mg by mouth daily.) 90 tablet 1  . sulfamethoxazole-trimethoprim (BACTRIM DS) 800-160 MG tablet Take 1 tablet by mouth 2 (two) times daily. 6 tablet 0  . terbinafine (LAMISIL AT) 1 % cream Apply 1 application topically 2 (two) times daily. Both bottom and top of both feet and toes 36 g 0  . traZODone (DESYREL) 100 MG tablet Take 1 tablet (100 mg total) by mouth at bedtime. 30 tablet 2   No current facility-administered medications for this visit.   Facility-Administered Medications  Ordered in Other Visits  Medication Dose Route Frequency Provider Last Rate Last Admin  . sodium chloride flush (NS) 0.9 % injection 10 mL  10 mL Intracatheter PRN Truitt Merle, MD   10 mL at 06/16/20 1632    PHYSICAL EXAMINATION: ECOG PERFORMANCE STATUS: 2 - Symptomatic, <50% confined to bed Blood pressure 118/66, heart rate 76, respirate 18, pulse ox 100% on room air GENERAL:alert, no  distress and comfortable SKIN: skin color, texture, turgor are normal, no rashes or significant lesions EYES: normal, Conjunctiva are pink and non-injected, sclera clear Musculoskeletal:no cyanosis of digits and no clubbing, mild bilateral lower extremity edema and skin hyperpigmentation NEURO: alert & oriented x 3 with fluent speech, no focal motor/sensory deficits  LABORATORY DATA:  I have reviewed the data as listed CBC Latest Ref Rng & Units 06/16/2020 06/14/2020 06/09/2020  WBC 4.0 - 10.5 K/uL 11.9(H) 13.7(H) 11.1(H)  Hemoglobin 12.0 - 15.0 g/dL 8.0(L) 8.6(L) 6.9(LL)  Hematocrit 36.0 - 46.0 % 26.9(L) 28.2(L) 24.3(L)  Platelets 150 - 400 K/uL 287 353 298     CMP Latest Ref Rng & Units 06/16/2020 06/09/2020 06/02/2020  Glucose 70 - 99 mg/dL 113(H) 104(H) 123(H)  BUN 6 - 20 mg/dL 9 10 8   Creatinine 0.44 - 1.00 mg/dL 0.78 0.78 0.79  Sodium 135 - 145 mmol/L 142 140 142  Potassium 3.5 - 5.1 mmol/L 3.6 3.6 3.9  Chloride 98 - 111 mmol/L 107 107 108  CO2 22 - 32 mmol/L 24 24 23   Calcium 8.9 - 10.3 mg/dL 9.0 9.1 8.9  Total Protein 6.5 - 8.1 g/dL 7.4 7.1 7.4  Total Bilirubin 0.3 - 1.2 mg/dL <0.2(L) <0.2(L) 0.2(L)  Alkaline Phos 38 - 126 U/L 74 71 77  AST 15 - 41 U/L 13(L) 11(L) 15  ALT 0 - 44 U/L 7 7 7       RADIOGRAPHIC STUDIES: I have personally reviewed the radiological images as listed and agreed with the findings in the report. No results found.   ASSESSMENT & PLAN:  Peggy House is a 61 y.o. female with    1. 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 Extensive workup found the pt to have HHT based on her personal and familyhistoryand recurrentAVM GI bleedings. -Sheis s/pIR embolization of the left Gastric and short gastric artery branchvesselswithout evidence of residual flow in  the Dieulafoy lesionon 12/10/2017. -She has been on bevacizumabevery2-4 week,treatment has been intermittently held due to large proteinuria. She has been on IV Ferric Gluconate every 1-2 weeks (unless Ferritin >200)and blood transfusions as needed.We repeatedly discussed consistency in treatments to manage her blood counts, she voices good understanding. -In the past week she has an episode of moderate epistaxis and intermittent mild GI bleeding. Labs reviewed today, Wbc 11.9, Hg 8, ANC 9.9, BG 113, albumin 3.3. Will proceed with IV Ferric Gluconate today.  -I recommend she restart Amicar 1mg  BID everyday given her increased bleeding. She is agreeable. I refilled today  -Continue weekly labs and Continue Beva (if urine protein <300) every 2 weeks andFerricGluconate 250mg  (unlessFerritin >200 on last lab)every 1 weeks.  -F/u in 3 weeks.  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 She is also on blood transfusionsas needed.Last Blood transfusion was3/1/22 due to recent increase of GI bleeding and Epistaxis. Continue prenatal vitamin. -Hg 8 today as she has had intermittent GI bleeding this week and continues to have epistaxis. I recommend she restart Amicar daily (06/16/20)  3. Reactive Leukocytosis  -She has mild leukocytosis with dominant neutrophils, likely reactive. Her prior thrombocytosis resolved.  -Mostly mild and stable.   4. 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 She required cauterization for stomach for severe Gi bleeding in 11/2018 -Continue to follow up with her GI, Dr. Cristina Gong -I started heron  Amicar 07/17/18. Given DVT she will only take Amicar for severe epistaxis -She had recurrence in GI bleeding this weekend with black stool and mentioned increased epistaxis recently (04/28/20). I strongly encouraged her to contact my office when this occurs.  5.History of right Arm DVT -She had right arm DVT that extended to her right clavicleon 12/07/2018.  -She is not a candidate for anticoagulation due to severe GI bleeding. We stopped Tamoxifen, will continue low doseAmicar for epistaxis and GI bleeding. Will maintain [place] flushes.  6. Smoking cessation -She was smoking 10 cigarettes a day on average before recent hospitalization -I strongly encouraged her to stop smoking completely, she will try.  7. Hypokalemia -Onpotassium chloride 20 mEq daily, continue. -Has resolved in 05/2020.   8. HTN, uncontrolled, Recently diagnosed DM -Continue medications. Will monitor on Avastin.  -Continue to f/u with PCP   9. Anxiety, Insomnia  -She has been stressed with family health issues (her husband's passing summer 2021 and son's DM on transplant list). She has been more anxious and started having panic attacks -She is currently on Xanax at 0.5mg  dose to use only as needed. F/u with SW as needed for counseling.  -She recently started Trazadone (06/15/20). This has helped her sleep better.   10. B/l LE Edema, Fungal infection of feet, inguinal reactive lymphadenopathy -Stable. Continue compression socksand leg elevation  -She had a recent US of leg for her swelling, no DVT but she was found to have lymphadenopathy of b/l groin. She does have skin darkening of her legs which she has had in recent months. She also has fungal infection of her feet with non-palpable inguinal Lymphadenopathy.  -She was seen by Podiatrist for her fungal infection and had foot scraped and given feet powder. She was also offered further treatment. She uses compression socks as well.    PLAN:  -restart Amicar  1g BID today, I refilled today  -Proceed with Ferric Gluconateand bevacizumab today  -Continue weekly flush and labs with Beva every 2 weeks (if urine protein <300) andFerricGluconate 250mg  (unlessFerritin >200 on last lab)every 1 week.   -F/u in 3 weeks.    No problem-specific Assessment & Plan notes found for this encounter.   No orders of the defined types were placed in this encounter.  All questions were answered. The patient knows to call the clinic with any problems, questions or concerns. No barriers to learning was detected. The total time spent in the appointment was 30 minutes.     Truitt Merle, MD 06/16/2020   I, Joslyn Devon, am acting as scribe for Truitt Merle, MD.   I have reviewed the above documentation for accuracy and completeness, and I agree with the above.

## 2020-06-14 NOTE — Assessment & Plan Note (Signed)
Her husband died last August after being hit by a car. She does not feel like she has dealt with this emotionally and has been depressed, has little interest in things, is unable to sleep. She does not have feelings of wanting to hurt herself or SI. Her son now lives with her and her family calls he daily so she does feel like she has a strong support system. She is on zoloft 100 mg, which was switched from prozac in 2019. She takes xanax every evening which she does not feel helps. She is interested in starting additional medication, especially to help her sleep.   - continue zoloft 100 mg  - start trazadone 100 mg qhs  - decrease xanax to .25 mg for one week then stop  - referral placed to Dr. Theodis Shove, additionally given information on [facility] - f/u in one month to assess efficacy

## 2020-06-14 NOTE — Patient Instructions (Addendum)
Thank you for allowing [place] to provide your care today. Today we discussed your leg pain and swelling.     I have ordered the following labs for you:  CBC   I will call if any are abnormal.    Today we made the following changes to your medications:   Please START taking  trazadone 100 mg tablet - take one tablet every evening.  Decrease alprazolam to half tablet for one week, then stop.     I have placed a referral with our counselor, Dr. Theodis Shove. She will call you to schedule an appointment.   Please follow-up in one month to check symptoms.    Please call the internal medicine center clinic if you have any questions or concerns, we may be able to help and keep you from a long and expensive emergency room wait. Our clinic and after hours phone number is [phone], the best time to call is Monday through Friday 9 am to 4 pm but there is always someone available 24/7 if you have an emergency. If you need medication refills please notify your pharmacy one week in advance and they will send [place] a request.

## 2020-06-14 NOTE — Progress Notes (Signed)
   CC: difficulty sleeping   HPI:  [name] is a 60 y.o. with PMH as below.   Please see A&P for assessment of the patient's acute and chronic medical conditions.   She has been having increased bilateral lower extremity swelling for the past few months although the left is worse than the right. The left leg feels very tight and burns. 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 She had a doppler US done Feb 1st which did not show any DVT but did show left inguinal lymphadenopathy. She has a history of venous stasis and does not wear compression stockings or elevate her legs. No fever, chills, or cuts that she knows of. She does receive frequent blood transfusions. No shortness of breath or orthopnea.   Her husband died last 31-Dec-2022 after being hit by a car. She does not feel like she has dealt with this emotionally and has been depressed, has little interest in things, is unable to sleep. She does not have feelings of wanting to hurt herself or SI. Her son now lives with her and her family calls her daily so she does feel like she has a strong support system. She is on zoloft 100 mg, which was switched from prozac in 2019. She takes xanax every evening which she does not feel helps. She is interested in starting additional medication, especially to help her sleep.   Most recently have hemoglobin of 6.9. Received 1U transfusion four days ago but did not have hemoglobin check after. She did have a significant nose bleed this morning, which has resolved.   Past Medical History:  Diagnosis Date  . Anxiety   . Arthritis    knnes,back  . GERD (gastroesophageal reflux disease)   . 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 History of swelling of feet   . Hyperlipidemia   . Hypertension   . Major depressive disorder, recurrent episode ([place]) 06/05/2015  . Obesity   . Snores   . Type 2 diabetes mellitus with vascular disease ([place])  02/26/2019   Review of Systems:   10 point ROS negative except as noted in HPI  Physical Exam: [place]: NAD, appears stated age, obese HENT: [place]/AT  Eyes: no icterus or injection  Cardio: III/VI LUSB murmur, RRR, bilateral LE edema, left > right Respiratory: CTA, no w/r/r Abdominal: NTTP, soft, non-distended MSK: diffuse muscle weakness with unsteady gait requiring assistance with ambulation, although strength symmetrical Neuro: normal affect, a&ox3 Skin: lower extremity venous stasis changes, no lower extremity lesions   Vitals:   06/14/20 1513  BP: (!) 145/78  Pulse: 79  Temp: 98.2 F (36.8 C)  TempSrc: Oral  SpO2: 100%  Weight: 253 lb 12.8 oz (115.1 kg)  Height: 5\' 6"  (1.676 m)     Assessment & Plan:   See Encounters Tab for problem based charting.  Patient discussed with Dr. Jimmye Norman

## 2020-06-14 NOTE — Assessment & Plan Note (Signed)
Most recently have hemoglobin of 6.9. Received 1U transfusion four days ago but did not have hemoglobin check after. She did have a significant nose bleed this morning, which has resolved.    - check CBC today

## 2020-06-15 LAB — CBC
Hematocrit: 28.2 % — ABNORMAL LOW (ref 34.0–46.6)
Hemoglobin: 8.6 g/dL — ABNORMAL LOW (ref 11.1–15.9)
MCH: 27.2 pg (ref 26.6–33.0)
MCHC: 30.5 g/dL — ABNORMAL LOW (ref 31.5–35.7)
MCV: 89 fL (ref 79–97)
Platelets: 353 10*3/uL (ref 150–450)
RBC: 3.16 x10E6/uL — ABNORMAL LOW (ref 3.77–5.28)
RDW: 18.9 % — ABNORMAL HIGH (ref 11.7–15.4)
WBC: 13.7 10*3/uL — ABNORMAL HIGH (ref 3.4–10.8)

## 2020-06-16 ENCOUNTER — Inpatient Hospital Stay: Payer: Medicaid Other

## 2020-06-16 ENCOUNTER — Other Ambulatory Visit: Payer: Self-pay

## 2020-06-16 ENCOUNTER — Encounter: Payer: Self-pay | Admitting: Hematology

## 2020-06-16 ENCOUNTER — Inpatient Hospital Stay: Payer: Medicaid Other | Attending: Hematology

## 2020-06-16 ENCOUNTER — Inpatient Hospital Stay (HOSPITAL_BASED_OUTPATIENT_CLINIC_OR_DEPARTMENT_OTHER): Payer: Medicaid Other | Admitting: Hematology

## 2020-06-16 VITALS — BP 118/66 | HR 76 | Temp 98.4°F | Resp 18

## 2020-06-16 DIAGNOSIS — Z95828 Presence of other vascular implants and grafts: Secondary | ICD-10-CM

## 2020-06-16 DIAGNOSIS — K922 Gastrointestinal hemorrhage, unspecified: Secondary | ICD-10-CM | POA: Diagnosis not present

## 2020-06-16 DIAGNOSIS — Z5112 Encounter for antineoplastic immunotherapy: Secondary | ICD-10-CM | POA: Insufficient documentation

## 2020-06-16 DIAGNOSIS — I780 Hereditary hemorrhagic telangiectasia: Secondary | ICD-10-CM | POA: Diagnosis not present

## 2020-06-16 DIAGNOSIS — D500 Iron deficiency anemia secondary to blood loss (chronic): Secondary | ICD-10-CM | POA: Diagnosis not present

## 2020-06-16 DIAGNOSIS — R808 Other proteinuria: Secondary | ICD-10-CM

## 2020-06-16 LAB — TOTAL PROTEIN, URINE DIPSTICK: Protein, ur: 100 mg/dL — AB

## 2020-06-16 LAB — CMP (CANCER CENTER ONLY)
ALT: 7 U/L (ref 0–44)
AST: 13 U/L — ABNORMAL LOW (ref 15–41)
Albumin: 3.3 g/dL — ABNORMAL LOW (ref 3.5–5.0)
Alkaline Phosphatase: 74 U/L (ref 38–126)
Anion gap: 11 (ref 5–15)
BUN: 9 mg/dL (ref 6–20)
CO2: 24 mmol/L (ref 22–32)
Calcium: 9 mg/dL (ref 8.9–10.3)
Chloride: 107 mmol/L (ref 98–111)
Creatinine: 0.78 mg/dL (ref 0.44–1.00)
GFR, Estimated: >60 mL/min (ref >60)
Glucose, Bld: 113 mg/dL — ABNORMAL HIGH (ref 70–99)
Potassium: 3.6 mmol/L (ref 3.5–5.1)
Sodium: 142 mmol/L (ref 135–145)
Total Bilirubin: <0.2 mg/dL — ABNORMAL LOW (ref 0.3–1.2)
Total Protein: 7.4 g/dL (ref 6.5–8.1)

## 2020-06-16 LAB — CBC WITH DIFFERENTIAL (CANCER CENTER ONLY)
Abs Immature Granulocytes: 0.03 10*3/uL (ref 0.00–0.07)
Basophils Absolute: 0 10*3/uL (ref 0.0–0.1)
Basophils Relative: 0 %
Eosinophils Absolute: 0.1 10*3/uL (ref 0.0–0.5)
Eosinophils Relative: 1 %
HCT: 26.9 % — ABNORMAL LOW (ref 36.0–46.0)
Hemoglobin: 8 g/dL — ABNORMAL LOW (ref 12.0–15.0)
Immature Granulocytes: 0 %
Lymphocytes Relative: 11 %
Lymphs Abs: 1.3 10*3/uL (ref 0.7–4.0)
MCH: 27.4 pg (ref 26.0–34.0)
MCHC: 29.7 g/dL — ABNORMAL LOW (ref 30.0–36.0)
MCV: 92.1 fL (ref 80.0–100.0)
Monocytes Absolute: 0.5 10*3/uL (ref 0.1–1.0)
Monocytes Relative: 5 %
Neutro Abs: 9.9 10*3/uL — ABNORMAL HIGH (ref 1.7–7.7)
Neutrophils Relative %: 83 %
Platelet Count: 287 10*3/uL (ref 150–400)
RBC: 2.92 MIL/uL — ABNORMAL LOW (ref 3.87–5.11)
RDW: 21.1 % — ABNORMAL HIGH (ref 11.5–15.5)
WBC Count: 11.9 10*3/uL — ABNORMAL HIGH (ref 4.0–10.5)
nRBC: 0 % (ref 0.0–0.2)

## 2020-06-16 LAB — SAMPLE TO BLOOD BANK

## 2020-06-16 MED ORDER — SODIUM CHLORIDE 0.9 % IV SOLN
250.0000 mg | Freq: Once | INTRAVENOUS | Status: AC
  Administered 2020-06-16: 250 mg via INTRAVENOUS
  Filled 2020-06-16: qty 20

## 2020-06-16 MED ORDER — TERBINAFINE HCL 1 % EX CREA: 1.0000 "application " | TOPICAL_CREAM | Freq: Two times a day (BID) | CUTANEOUS | 0 refills | Status: DC

## 2020-06-16 MED ORDER — SODIUM CHLORIDE 0.9% FLUSH
10.0000 mL | Freq: Once | INTRAVENOUS | Status: AC
  Administered 2020-06-16: 10 mL
  Filled 2020-06-16: qty 10

## 2020-06-16 MED ORDER — SODIUM CHLORIDE 0.9 % IV SOLN
Freq: Once | INTRAVENOUS | Status: AC
  Filled 2020-06-16: qty 250

## 2020-06-16 MED ORDER — SODIUM CHLORIDE 0.9 % IV SOLN
250.0000 mg | Freq: Once | INTRAVENOUS | Status: DC
  Filled 2020-06-16: qty 20

## 2020-06-16 MED ORDER — SODIUM CHLORIDE 0.9 % IV SOLN
5.0000 mg/kg | Freq: Once | INTRAVENOUS | Status: AC
  Administered 2020-06-16: 600 mg via INTRAVENOUS
  Filled 2020-06-16: qty 16

## 2020-06-16 MED ORDER — HEPARIN SOD (PORK) LOCK FLUSH 100 UNIT/ML IV SOLN
500.0000 [IU] | Freq: Once | INTRAVENOUS | Status: AC | PRN
  Administered 2020-06-16: 500 [IU]
  Filled 2020-06-16: qty 5

## 2020-06-16 MED ORDER — SODIUM CHLORIDE 0.9% FLUSH
10.0000 mL | INTRAVENOUS | Status: DC | PRN
  Administered 2020-06-16: 10 mL
  Filled 2020-06-16: qty 10

## 2020-06-16 NOTE — Patient Instructions (Signed)

## 2020-06-16 NOTE — Assessment & Plan Note (Signed)
Images in media tab. Start lamisil bid to both feet for at least two weeks.

## 2020-06-16 NOTE — Patient Instructions (Signed)
[facility] Discharge Instructions for Patients Receiving Chemotherapy  Today you received the following chemotherapy agents: Avastin  To help prevent nausea and vomiting after your treatment, we encourage you to take your nausea medication as directed.   If you develop nausea and vomiting that is not controlled by your nausea medication, call the clinic.   BELOW ARE SYMPTOMS THAT SHOULD BE REPORTED IMMEDIATELY:  *FEVER GREATER THAN 100.5 F  *CHILLS WITH OR WITHOUT FEVER  NAUSEA AND VOMITING THAT IS NOT CONTROLLED WITH YOUR NAUSEA MEDICATION  *UNUSUAL SHORTNESS OF BREATH  *UNUSUAL BRUISING OR BLEEDING  TENDERNESS IN MOUTH AND THROAT WITH OR WITHOUT PRESENCE OF ULCERS  *URINARY PROBLEMS  *BOWEL PROBLEMS  UNUSUAL RASH Items with * indicate a potential emergency and should be followed up as soon as possible.  Feel free to call the clinic should you have any questions or concerns. The clinic phone number is [phone].  Please show the [place] at check-in to the Emergency Department and triage nurse.  Sodium Ferric Gluconate Complex injection What is this medicine? SODIUM FERRIC GLUCONATE COMPLEX (SOE dee um FER ik GLOO koe nate KOM pleks) is an iron replacement. It is used with epoetin therapy to treat low iron levels in patients who are receiving hemodialysis. This medicine may be used for other purposes; ask your health care provider or pharmacist if you have questions. COMMON BRAND NAME(S): Ferrlecit, Nulecit What should I tell my health care provider before I take this medicine? They need to know if you have any of the following conditions:  anemia that is not from iron deficiency  high levels of iron in the body  an unusual or allergic reaction to iron, benzyl alcohol, other medicines, foods, dyes, or preservatives  pregnant or are trying to become pregnant  breast-feeding How should I use this medicine? This medicine is for infusion  into a vein. It is given by a health care professional in a hospital or clinic setting. Talk to your pediatrician regarding the use of this medicine in children. While this drug may be prescribed for children as young as 32 years old for selected conditions, precautions do apply. Overdosage: If you think you have taken too much of this medicine contact a poison control center or emergency room at once. NOTE: This medicine is only for you. Do not share this medicine with others. What if I miss a dose? It is important not to miss your dose. Call your doctor or health care professional if you are unable to keep an appointment. What may interact with this medicine? Do not take this medicine with any of the following medications:  deferoxamine  dimercaprol  other iron products This medicine may also interact with the following medications:  chloramphenicol  deferasirox  medicine for blood pressure like enalapril This list may not describe all possible interactions. Give your health care provider a list of all the medicines, herbs, non-prescription drugs, or dietary supplements you use. Also tell them if you smoke, drink alcohol, or use illegal drugs. Some items may interact with your medicine. What should I watch for while using this medicine? Your condition will be monitored carefully while you are receiving this medicine. Visit your doctor for check-ups as directed. What side effects may I notice from receiving this medicine? Side effects that you should report to your doctor or health care professional as soon as possible:  allergic reactions like skin rash, itching or hives, swelling of the face, lips, or tongue  breathing problems  changes in hearing  changes in vision  chills, flushing, or sweating  fast, irregular heartbeat  feeling faint or lightheaded, falls  fever, flu-like symptoms  high or low blood pressure  pain, tingling, numbness in the hands or feet  severe  pain in the chest, back, flanks, or groin  swelling of the ankles, feet, hands  trouble passing urine or change in the amount of urine  unusually weak or tired Side effects that usually do not require medical attention (report to your doctor or health care professional if they continue or are bothersome):  cramps  dark colored stools  diarrhea  headache  nausea, vomiting  stomach upset This list may not describe all possible side effects. Call your doctor for medical advice about side effects. You may report side effects to FDA at 1-800-FDA-1088. Where should I keep my medicine? This drug is given in a hospital or clinic and will not be stored at home. NOTE: This sheet is a summary. It may not cover all possible information. If you have questions about this medicine, talk to your doctor, pharmacist, or health care provider.  2021 Elsevier/Gold Standard (2007-12-02 15:58:57)

## 2020-06-16 NOTE — Addendum Note (Signed)
Addended by: Molli Hazard A on: 06/16/2020 12:31 PM   Modules accepted: Orders

## 2020-06-19 ENCOUNTER — Telehealth: Payer: Self-pay | Admitting: Hematology

## 2020-06-19 NOTE — Telephone Encounter (Signed)
Completed and signed Request for Independent Assessment for [place] of Medical Need faxed to [place] at [facility] at [phone].

## 2020-06-19 NOTE — Telephone Encounter (Signed)
Scheduled appt per 3/4 los - pt to get an updated schedule next visit.   

## 2020-06-20 ENCOUNTER — Encounter: Payer: Self-pay | Admitting: Internal Medicine

## 2020-06-22 ENCOUNTER — Other Ambulatory Visit: Payer: Self-pay | Admitting: Hematology

## 2020-06-23 ENCOUNTER — Inpatient Hospital Stay: Payer: Medicaid Other

## 2020-06-23 ENCOUNTER — Other Ambulatory Visit: Payer: Self-pay

## 2020-06-23 VITALS — BP 102/55 | HR 72 | Temp 97.5°F | Resp 18

## 2020-06-23 DIAGNOSIS — D500 Iron deficiency anemia secondary to blood loss (chronic): Secondary | ICD-10-CM

## 2020-06-23 DIAGNOSIS — Z95828 Presence of other vascular implants and grafts: Secondary | ICD-10-CM

## 2020-06-23 DIAGNOSIS — I780 Hereditary hemorrhagic telangiectasia: Secondary | ICD-10-CM

## 2020-06-23 DIAGNOSIS — R808 Other proteinuria: Secondary | ICD-10-CM

## 2020-06-23 DIAGNOSIS — Z5112 Encounter for antineoplastic immunotherapy: Secondary | ICD-10-CM | POA: Diagnosis not present

## 2020-06-23 LAB — CMP (CANCER CENTER ONLY)
ALT: <6 U/L (ref 0–44)
AST: 12 U/L — ABNORMAL LOW (ref 15–41)
Albumin: 3.4 g/dL — ABNORMAL LOW (ref 3.5–5.0)
Alkaline Phosphatase: 68 U/L (ref 38–126)
Anion gap: 13 (ref 5–15)
BUN: 12 mg/dL (ref 6–20)
CO2: 23 mmol/L (ref 22–32)
Calcium: 9.1 mg/dL (ref 8.9–10.3)
Chloride: 109 mmol/L (ref 98–111)
Creatinine: 0.92 mg/dL (ref 0.44–1.00)
GFR, Estimated: >60 mL/min (ref >60)
Glucose, Bld: 119 mg/dL — ABNORMAL HIGH (ref 70–99)
Potassium: 3.4 mmol/L — ABNORMAL LOW (ref 3.5–5.1)
Sodium: 145 mmol/L (ref 135–145)
Total Bilirubin: <0.2 mg/dL — ABNORMAL LOW (ref 0.3–1.2)
Total Protein: 7.4 g/dL (ref 6.5–8.1)

## 2020-06-23 LAB — CBC WITH DIFFERENTIAL (CANCER CENTER ONLY)
Abs Immature Granulocytes: 0.07 10*3/uL (ref 0.00–0.07)
Basophils Absolute: 0.1 10*3/uL (ref 0.0–0.1)
Basophils Relative: 0 %
Eosinophils Absolute: 0.1 10*3/uL (ref 0.0–0.5)
Eosinophils Relative: 1 %
HCT: 25.6 % — ABNORMAL LOW (ref 36.0–46.0)
Hemoglobin: 7.3 g/dL — ABNORMAL LOW (ref 12.0–15.0)
Immature Granulocytes: 1 %
Lymphocytes Relative: 12 %
Lymphs Abs: 1.5 10*3/uL (ref 0.7–4.0)
MCH: 26.5 pg (ref 26.0–34.0)
MCHC: 28.5 g/dL — ABNORMAL LOW (ref 30.0–36.0)
MCV: 93.1 fL (ref 80.0–100.0)
Monocytes Absolute: 0.6 10*3/uL (ref 0.1–1.0)
Monocytes Relative: 5 %
Neutro Abs: 10.4 10*3/uL — ABNORMAL HIGH (ref 1.7–7.7)
Neutrophils Relative %: 81 %
Platelet Count: 334 10*3/uL (ref 150–400)
RBC: 2.75 MIL/uL — ABNORMAL LOW (ref 3.87–5.11)
RDW: 21.4 % — ABNORMAL HIGH (ref 11.5–15.5)
WBC Count: 12.6 10*3/uL — ABNORMAL HIGH (ref 4.0–10.5)
nRBC: 0.2 % (ref 0.0–0.2)

## 2020-06-23 LAB — IRON AND TIBC
Iron: 37 ug/dL — ABNORMAL LOW (ref 41–142)
Saturation Ratios: 15 % — ABNORMAL LOW (ref 21–57)
TIBC: 248 ug/dL (ref 236–444)
UIBC: 211 ug/dL (ref 120–384)

## 2020-06-23 LAB — TOTAL PROTEIN, URINE DIPSTICK: Protein, ur: 100 mg/dL — AB

## 2020-06-23 LAB — SAMPLE TO BLOOD BANK

## 2020-06-23 LAB — FERRITIN: Ferritin: 179 ng/mL (ref 11–307)

## 2020-06-23 MED ORDER — HEPARIN SOD (PORK) LOCK FLUSH 100 UNIT/ML IV SOLN
500.0000 [IU] | Freq: Once | INTRAVENOUS | Status: AC
  Administered 2020-06-23: 500 [IU]
  Filled 2020-06-23: qty 5

## 2020-06-23 MED ORDER — SODIUM CHLORIDE 0.9 % IV SOLN
Freq: Once | INTRAVENOUS | Status: AC
  Filled 2020-06-23: qty 250

## 2020-06-23 MED ORDER — SODIUM CHLORIDE 0.9 % IV SOLN
250.0000 mg | Freq: Once | INTRAVENOUS | Status: AC
  Administered 2020-06-23: 250 mg via INTRAVENOUS
  Filled 2020-06-23: qty 20

## 2020-06-23 MED ORDER — SODIUM CHLORIDE 0.9 % IV SOLN
250.0000 mg | Freq: Once | INTRAVENOUS | Status: DC
  Filled 2020-06-23: qty 20

## 2020-06-23 MED ORDER — SODIUM CHLORIDE 0.9% FLUSH
10.0000 mL | Freq: Once | INTRAVENOUS | Status: AC
  Administered 2020-06-23: 10 mL
  Filled 2020-06-23: qty 10

## 2020-06-23 NOTE — Patient Instructions (Signed)
Sodium Ferric Gluconate Complex injection What is this medicine? SODIUM FERRIC GLUCONATE COMPLEX (SOE dee um FER ik GLOO koe nate KOM pleks) is an iron replacement. It is used with epoetin therapy to treat low iron levels in patients who are receiving hemodialysis. This medicine may be used for other purposes; ask your health care provider or pharmacist if you have questions. COMMON BRAND NAME(S): Ferrlecit, Nulecit What should I tell my health care provider before I take this medicine? They need to know if you have any of the following conditions:  anemia that is not from iron deficiency  high levels of iron in the body  an unusual or allergic reaction to iron, benzyl alcohol, other medicines, foods, dyes, or preservatives  pregnant or are trying to become pregnant  breast-feeding How should I use this medicine? This medicine is for infusion into a vein. It is given by a health care professional in a hospital or clinic setting. Talk to your pediatrician regarding the use of this medicine in children. While this drug may be prescribed for children as young as 6 years old for selected conditions, precautions do apply. Overdosage: If you think you have taken too much of this medicine contact a poison control center or emergency room at once. NOTE: This medicine is only for you. Do not share this medicine with others. What if I miss a dose? It is important not to miss your dose. Call your doctor or health care professional if you are unable to keep an appointment. What may interact with this medicine? Do not take this medicine with any of the following medications:  deferoxamine  dimercaprol  other iron products This medicine may also interact with the following medications:  chloramphenicol  deferasirox  medicine for blood pressure like enalapril This list may not describe all possible interactions. Give your health care provider a list of all the medicines, herbs,  non-prescription drugs, or dietary supplements you use. Also tell them if you smoke, drink alcohol, or use illegal drugs. Some items may interact with your medicine. What should I watch for while using this medicine? Your condition will be monitored carefully while you are receiving this medicine. Visit your doctor for check-ups as directed. What side effects may I notice from receiving this medicine? Side effects that you should report to your doctor or health care professional as soon as possible:  allergic reactions like skin rash, itching or hives, swelling of the face, lips, or tongue  breathing problems  changes in hearing  changes in vision  chills, flushing, or sweating  fast, irregular heartbeat  feeling faint or lightheaded, falls  fever, flu-like symptoms  high or low blood pressure  pain, tingling, numbness in the hands or feet  severe pain in the chest, back, flanks, or groin  swelling of the ankles, feet, hands  trouble passing urine or change in the amount of urine  unusually weak or tired Side effects that usually do not require medical attention (report to your doctor or health care professional if they continue or are bothersome):  cramps  dark colored stools  diarrhea  headache  nausea, vomiting  stomach upset This list may not describe all possible side effects. Call your doctor for medical advice about side effects. You may report side effects to FDA at 1-800-FDA-1088. Where should I keep my medicine? This drug is given in a hospital or clinic and will not be stored at home. NOTE: This sheet is a summary. It may not cover all   possible information. If you have questions about this medicine, talk to your doctor, pharmacist, or health care provider.  2021 Elsevier/Gold Standard (2007-12-02 15:58:57)   

## 2020-06-23 NOTE — Progress Notes (Signed)
Per Dr. Burr Medico, patient is getting only iron infusion today.   During assessment, patient stated she had a BM that was black and tarry on 06/22/20. Patient has not had a BM since then. Dr. Burr Medico made aware, no new orders at this time.    Patient declined to stay for 30 minute post observation. VSS upon leaving unit.

## 2020-06-23 NOTE — Patient Instructions (Signed)
Implanted Port Insertion, Care After This sheet gives you information about how to care for yourself after your procedure. Your health care provider may also give you more specific instructions. If you have problems or questions, contact your health care provider. What can I expect after the procedure? After the procedure, it is common to have:  Discomfort at the port insertion site.  Bruising on the skin over the port. This should improve over 3-4 days. Follow these instructions at home: Port care  After your port is placed, you will get a manufacturer's information card. The card has information about your port. Keep this card with you at all times.  Take care of the port as told by your health care provider. Ask your health care provider if you or a family member can get training for taking care of the port at home. A home health care nurse may also take care of the port.  Make sure to remember what type of port you have. Incision care  Follow instructions from your health care provider about how to take care of your port insertion site. Make sure you: ? Wash your hands with soap and water before and after you change your bandage (dressing). If soap and water are not available, use hand sanitizer. ? Change your dressing as told by your health care provider. ? Leave stitches (sutures), skin glue, or adhesive strips in place. These skin closures may need to stay in place for 2 weeks or longer. If adhesive strip edges start to loosen and curl up, you may trim the loose edges. Do not remove adhesive strips completely unless your health care provider tells you to do that.  Check your port insertion site every day for signs of infection. Check for: ? Redness, swelling, or pain. ? Fluid or blood. ? Warmth. ? Pus or a bad smell.      Activity  Return to your normal activities as told by your health care provider. Ask your health care provider what activities are safe for you.  Do not  lift anything that is heavier than 10 lb (4.5 kg), or the limit that you are told, until your health care provider says that it is safe. General instructions  Take over-the-counter and prescription medicines only as told by your health care provider.  Do not take baths, swim, or use a hot tub until your health care provider approves. Ask your health care provider if you may take showers. You may only be allowed to take sponge baths.  Do not drive for 24 hours if you were given a sedative during your procedure.  Wear a medical alert bracelet in case of an emergency. This will tell any health care providers that you have a port.  Keep all follow-up visits as told by your health care provider. This is important. Contact a health care provider if:  You cannot flush your port with saline as directed, or you cannot draw blood from the port.  You have a fever or chills.  You have redness, swelling, or pain around your port insertion site.  You have fluid or blood coming from your port insertion site.  Your port insertion site feels warm to the touch.  You have pus or a bad smell coming from the port insertion site. Get help right away if:  You have chest pain or shortness of breath.  You have bleeding from your port that you cannot control. Summary  Take care of the port as told by your   health care provider. Keep the manufacturer's information card with you at all times.  Change your dressing as told by your health care provider.  Contact a health care provider if you have a fever or chills or if you have redness, swelling, or pain around your port insertion site.  Keep all follow-up visits as told by your health care provider. This information is not intended to replace advice given to you by your health care provider. Make sure you discuss any questions you have with your health care provider. Document Revised: 10/28/2017 Document Reviewed: 10/28/2017 Elsevier Patient Education   2021 Elsevier Inc.  

## 2020-06-26 ENCOUNTER — Telehealth: Payer: Self-pay | Admitting: *Deleted

## 2020-06-26 NOTE — Telephone Encounter (Signed)
-----   Message from Alla Feeling, NP sent at 06/25/2020  2:18 PM EDT ----- Her Hg is dropping, please call to assess for signs of active bleeding and let me know in person, do not reply back in result notes.  Thanks, Regan Rakers, NP

## 2020-06-26 NOTE — Telephone Encounter (Signed)
Notified of message below. States she had a bad nose bleed on Sunday- it stopped on its own. Noticed black stool on Friday, had been for a few days. Stool normal now. Will call us to let us know if she has anymore black stool or any bleeding.

## 2020-06-26 NOTE — Progress Notes (Signed)
Internal Medicine Clinic Attending  Case discussed with Dr. Seawell  At the time of the visit.  We reviewed the resident's history and exam and pertinent patient test results.  I agree with the assessment, diagnosis, and plan of care documented in the resident's note.  

## 2020-06-28 ENCOUNTER — Other Ambulatory Visit: Payer: Self-pay

## 2020-06-28 ENCOUNTER — Ambulatory Visit: Payer: Medicaid Other | Admitting: Behavioral Health

## 2020-06-28 DIAGNOSIS — Z7189 Other specified counseling: Secondary | ICD-10-CM

## 2020-06-28 DIAGNOSIS — F331 Major depressive disorder, recurrent, moderate: Secondary | ICD-10-CM

## 2020-06-28 NOTE — BH Specialist Note (Signed)
Integrated Behavioral Health via Telemedicine Visit  06/28/2020 [facility] [id number]  Number of [place] visits: 1/6 Session Start time: 10:38am  Session End time: 11:00am Total time: 20  Referring Provider: Dr. Molli Hazard, DO Patient/Family location: Pt at home in private [facility] Provider location: [facility] All persons participating in visit: Pt & Clinician Types of Service: Individual psychotherapy  I connected with [name] and/or [name] self via  Telephone or Video Enabled Telemedicine Application  (Video is Caregility application) and verified that I am speaking with the correct person using two identifiers. Discussed confidentiality: Yes   I discussed the limitations of telemedicine and the availability of in person appointments.  Discussed there is a possibility of technology failure and discussed alternative modes of communication if that failure occurs.  I discussed that engaging in this telemedicine visit, they consent to the provision of behavioral healthcare and the services will be billed under their insurance.  Patient and/or legal guardian expressed understanding and consented to Telemedicine visit: Yes   Presenting Concerns: Patient and/or family reports the following symptoms/concerns: heightened sense of grief over death of Husb Duration of problem: 7 mos; Severity of problem: moderate; Pt not willing to accept the death yet, but trying to support her family through this time  Patient and/or Family's Strengths/Protective Factors: Social connections, Social and Emotional competence and [place] supports in place (healthy food, safe environments, etc.)  Goals Addressed: Patient will: 1.  Reduce symptoms of: emotionally-distressing impact of the death of her Husb 2.  Increase knowledge and/or ability of: coping skills, stress reduction and coping w/grief  3.  Demonstrate ability to: Increase healthy  adjustment to current life circumstances and Begin healthy grieving over loss  Progress towards Goals: Estb'd today: visits every 2 wks either telehealth or f:f  Interventions: Interventions utilized:  Intake/Assessment-Introduction Standardized Assessments completed: f/u with screeners as indicated  Patient and/or Family Response: Pt receptive to call today & wants future appts  Assessment: Patient currently experiencing elevated grief response to Husb's sudden & unexpected death in 12-11-2019. Pt cries & does not feel "myself anymore".  Patient may benefit from [place] protocol to normalize & validate Pt emot'l response.  Plan: 1. Follow up with behavioral health clinician on : 2 wks for one hour on telehealth due to lack of transportation 2. Behavioral recommendations: Use a notebook to record your feelings btwn now & nxt session. 3. Referral(s): [place] (In Clinic)  I discussed the assessment and treatment plan with the patient and/or parent/guardian. They were provided an opportunity to ask questions and all were answered. They agreed with the plan and demonstrated an understanding of the instructions.   They were advised to call back or seek an in-person evaluation if the symptoms worsen or if the condition fails to improve as anticipated.  [name], LMFT

## 2020-06-30 ENCOUNTER — Other Ambulatory Visit: Payer: Self-pay

## 2020-06-30 ENCOUNTER — Inpatient Hospital Stay: Payer: Medicaid Other

## 2020-06-30 ENCOUNTER — Other Ambulatory Visit: Payer: Self-pay | Admitting: Student

## 2020-06-30 VITALS — BP 145/68 | HR 66 | Temp 98.2°F | Resp 18 | Wt 256.8 lb

## 2020-06-30 DIAGNOSIS — Z95828 Presence of other vascular implants and grafts: Secondary | ICD-10-CM

## 2020-06-30 DIAGNOSIS — D500 Iron deficiency anemia secondary to blood loss (chronic): Secondary | ICD-10-CM

## 2020-06-30 DIAGNOSIS — I10 Essential (primary) hypertension: Secondary | ICD-10-CM

## 2020-06-30 DIAGNOSIS — Z5112 Encounter for antineoplastic immunotherapy: Secondary | ICD-10-CM | POA: Diagnosis not present

## 2020-06-30 DIAGNOSIS — I780 Hereditary hemorrhagic telangiectasia: Secondary | ICD-10-CM

## 2020-06-30 LAB — CBC WITH DIFFERENTIAL (CANCER CENTER ONLY)
Abs Immature Granulocytes: 0.05 10*3/uL (ref 0.00–0.07)
Basophils Absolute: 0.1 10*3/uL (ref 0.0–0.1)
Basophils Relative: 1 %
Eosinophils Absolute: 0.1 10*3/uL (ref 0.0–0.5)
Eosinophils Relative: 1 %
HCT: 23.7 % — ABNORMAL LOW (ref 36.0–46.0)
Hemoglobin: 6.8 g/dL — CL (ref 12.0–15.0)
Immature Granulocytes: 1 %
Lymphocytes Relative: 12 %
Lymphs Abs: 1.4 10*3/uL (ref 0.7–4.0)
MCH: 26.8 pg (ref 26.0–34.0)
MCHC: 28.7 g/dL — ABNORMAL LOW (ref 30.0–36.0)
MCV: 93.3 fL (ref 80.0–100.0)
Monocytes Absolute: 0.5 10*3/uL (ref 0.1–1.0)
Monocytes Relative: 4 %
Neutro Abs: 9.1 10*3/uL — ABNORMAL HIGH (ref 1.7–7.7)
Neutrophils Relative %: 81 %
Platelet Count: 302 10*3/uL (ref 150–400)
RBC: 2.54 MIL/uL — ABNORMAL LOW (ref 3.87–5.11)
RDW: 21.5 % — ABNORMAL HIGH (ref 11.5–15.5)
WBC Count: 11.1 10*3/uL — ABNORMAL HIGH (ref 4.0–10.5)
nRBC: 0.3 % — ABNORMAL HIGH (ref 0.0–0.2)

## 2020-06-30 LAB — SAMPLE TO BLOOD BANK

## 2020-06-30 LAB — TOTAL PROTEIN, URINE DIPSTICK: Protein, ur: 100 mg/dL — AB

## 2020-06-30 LAB — PREPARE RBC (CROSSMATCH)

## 2020-06-30 MED ORDER — SODIUM CHLORIDE 0.9 % IV SOLN
5.0000 mg/kg | Freq: Once | INTRAVENOUS | Status: AC
  Administered 2020-06-30: 600 mg via INTRAVENOUS
  Filled 2020-06-30: qty 16

## 2020-06-30 MED ORDER — SODIUM CHLORIDE 0.9 % IV SOLN
Freq: Once | INTRAVENOUS | Status: AC
Start: 2020-06-30 — End: 2020-06-30
  Filled 2020-06-30: qty 250

## 2020-06-30 MED ORDER — SODIUM CHLORIDE 0.9% FLUSH
10.0000 mL | INTRAVENOUS | Status: DC | PRN
  Filled 2020-06-30: qty 10

## 2020-06-30 MED ORDER — HEPARIN SOD (PORK) LOCK FLUSH 100 UNIT/ML IV SOLN
500.0000 [IU] | Freq: Once | INTRAVENOUS | Status: DC | PRN
  Filled 2020-06-30: qty 5

## 2020-06-30 MED ORDER — SODIUM CHLORIDE 0.9 % IV SOLN
250.0000 mg | Freq: Once | INTRAVENOUS | Status: AC
  Administered 2020-06-30: 250 mg via INTRAVENOUS
  Filled 2020-06-30: qty 20

## 2020-06-30 MED ORDER — SODIUM CHLORIDE 0.9% FLUSH
10.0000 mL | Freq: Once | INTRAVENOUS | Status: AC
Start: 2020-06-30 — End: 2020-06-30
  Administered 2020-06-30: 10 mL
  Filled 2020-06-30: qty 10

## 2020-06-30 MED ORDER — SODIUM CHLORIDE 0.9 % IV SOLN
250.0000 mg | Freq: Once | INTRAVENOUS | Status: DC
  Filled 2020-06-30: qty 20

## 2020-06-30 NOTE — Patient Instructions (Signed)

## 2020-06-30 NOTE — Patient Instructions (Addendum)
[facility] Discharge Instructions for Patients Receiving Chemotherapy  Today you received the following chemotherapy agents Bevacizumab(Avastin)  To help prevent nausea and vomiting after your treatment, we encourage you to take your nausea medication as directed.   If you develop nausea and vomiting that is not controlled by your nausea medication, call the clinic.   BELOW ARE SYMPTOMS THAT SHOULD BE REPORTED IMMEDIATELY:  *FEVER GREATER THAN 100.5 F  *CHILLS WITH OR WITHOUT FEVER  NAUSEA AND VOMITING THAT IS NOT CONTROLLED WITH YOUR NAUSEA MEDICATION  *UNUSUAL SHORTNESS OF BREATH  *UNUSUAL BRUISING OR BLEEDING  TENDERNESS IN MOUTH AND THROAT WITH OR WITHOUT PRESENCE OF ULCERS  *URINARY PROBLEMS  *BOWEL PROBLEMS  UNUSUAL RASH Items with * indicate a potential emergency and should be followed up as soon as possible.  Feel free to call the clinic should you have any questions or concerns. The clinic phone number is [phone].  Please show the [place] at check-in to the Emergency Department and triage nurse.  Iron Sucrose injection What is this medicine? IRON SUCROSE (AHY ern SOO krohs) is an iron complex. Iron is used to make healthy red blood cells, which carry oxygen and nutrients throughout the body. This medicine is used to treat iron deficiency anemia in people with chronic kidney disease. This medicine may be used for other purposes; ask your health care provider or pharmacist if you have questions. COMMON BRAND NAME(S): Venofer What should I tell my health care provider before I take this medicine? They need to know if you have any of these conditions: anemia not caused by low iron levels heart disease high levels of iron in the blood kidney disease liver disease an unusual or allergic reaction to iron, other medicines, foods, dyes, or preservatives pregnant or trying to get pregnant breast-feeding How should I use this  medicine? This medicine is for infusion into a vein. It is given by a health care professional in a hospital or clinic setting. Talk to your pediatrician regarding the use of this medicine in children. While this drug may be prescribed for children as young as 2 years for selected conditions, precautions do apply. Overdosage: If you think you have taken too much of this medicine contact a poison control center or emergency room at once. NOTE: This medicine is only for you. Do not share this medicine with others. What if I miss a dose? It is important not to miss your dose. Call your doctor or health care professional if you are unable to keep an appointment. What may interact with this medicine? Do not take this medicine with any of the following medications: deferoxamine dimercaprol other iron products This medicine may also interact with the following medications: chloramphenicol deferasirox This list may not describe all possible interactions. Give your health care provider a list of all the medicines, herbs, non-prescription drugs, or dietary supplements you use. Also tell them if you smoke, drink alcohol, or use illegal drugs. Some items may interact with your medicine. What should I watch for while using this medicine? Visit your doctor or healthcare professional regularly. Tell your doctor or healthcare professional if your symptoms do not start to get better or if they get worse. You may need blood work done while you are taking this medicine. You may need to follow a special diet. Talk to your doctor. Foods that contain iron include: whole grains/cereals, dried fruits, beans, or peas, leafy green vegetables, and organ meats (liver, kidney). What side effects  may I notice from receiving this medicine? Side effects that you should report to your doctor or health care professional as soon as possible: allergic reactions like skin rash, itching or hives, swelling of the face, lips, or  tongue breathing problems changes in blood pressure cough fast, irregular heartbeat feeling faint or lightheaded, falls fever or chills flushing, sweating, or hot feelings joint or muscle aches/pains seizures swelling of the ankles or feet unusually weak or tired Side effects that usually do not require medical attention (report to your doctor or health care professional if they continue or are bothersome): diarrhea feeling achy headache irritation at site where injected nausea, vomiting stomach upset tiredness This list may not describe all possible side effects. Call your doctor for medical advice about side effects. You may report side effects to FDA at 1-800-FDA-1088. Where should I keep my medicine? This drug is given in a hospital or clinic and will not be stored at home. NOTE: This sheet is a summary. It may not cover all possible information. If you have questions about this medicine, talk to your doctor, pharmacist, or health care provider.  2021 Elsevier/Gold Standard (2011-01-10 17:14:35)

## 2020-07-01 ENCOUNTER — Inpatient Hospital Stay: Payer: Medicaid Other

## 2020-07-01 ENCOUNTER — Other Ambulatory Visit: Payer: Self-pay

## 2020-07-01 DIAGNOSIS — Z5112 Encounter for antineoplastic immunotherapy: Secondary | ICD-10-CM | POA: Diagnosis not present

## 2020-07-01 MED ORDER — HEPARIN SOD (PORK) LOCK FLUSH 100 UNIT/ML IV SOLN
500.0000 [IU] | Freq: Every day | INTRAVENOUS | Status: AC | PRN
  Administered 2020-07-01: 500 [IU]
  Filled 2020-07-01: qty 5

## 2020-07-01 MED ORDER — SODIUM CHLORIDE 0.9% IV SOLUTION
250.0000 mL | Freq: Once | INTRAVENOUS | Status: AC
  Administered 2020-07-01: 250 mL via INTRAVENOUS
  Filled 2020-07-01: qty 250

## 2020-07-01 MED ORDER — SODIUM CHLORIDE 0.9% FLUSH
10.0000 mL | INTRAVENOUS | Status: AC | PRN
  Administered 2020-07-01: 10 mL
  Filled 2020-07-01: qty 10

## 2020-07-01 MED ORDER — DIPHENHYDRAMINE HCL 25 MG PO CAPS
25.0000 mg | ORAL_CAPSULE | Freq: Once | ORAL | Status: AC
  Administered 2020-07-01: 25 mg via ORAL

## 2020-07-01 MED ORDER — ACETAMINOPHEN 325 MG PO TABS
650.0000 mg | ORAL_TABLET | Freq: Once | ORAL | Status: AC
  Administered 2020-07-01: 650 mg via ORAL

## 2020-07-01 NOTE — Patient Instructions (Signed)

## 2020-07-02 LAB — BPAM RBC
Blood Product Expiration Date: 202204122359
ISSUE DATE / TIME: 202203191012
Unit Type and Rh: 600

## 2020-07-02 LAB — TYPE AND SCREEN
ABO/RH(D): A NEG
Antibody Screen: NEGATIVE
Unit division: 0

## 2020-07-03 ENCOUNTER — Telehealth: Payer: Self-pay

## 2020-07-03 NOTE — Telephone Encounter (Signed)
Pt states PCS will stop per [employer]. Requesting to speak with a nurse. Please call pt back.

## 2020-07-03 NOTE — Telephone Encounter (Signed)
Pt is requesting a call back about a letter she received

## 2020-07-04 ENCOUNTER — Telehealth: Payer: Self-pay | Admitting: *Deleted

## 2020-07-04 NOTE — Telephone Encounter (Signed)
Spoke with patient regarding PCS / states she was denied. Patient states she is needing for someone to drive her for shopping and to take her where she needs to go. Will discuss with Lauren.

## 2020-07-05 ENCOUNTER — Telehealth: Payer: Self-pay

## 2020-07-05 NOTE — Progress Notes (Signed)
[place]   [phone] [phone]   Clinic Follow up Note   Patient Care Team: [name], MD as PCP - General (Internal Medicine)  Date of Service:  07/07/2020  CHIEF COMPLAINT: F/uof HHT andAnemia   PREVIOUS THERAPY:  -Multiple EGD, small bowel enteroscope, C-scope with APC -Oral and iv amicar were given during hospital stay, no effect -IR embolization ofof the left gastric and short gastric artery branch vessels without evidence of residual flow in the Dieulafoy lesionon 12/10/17 -Avastin on 8/2 and 8/30 at [facility]; starting 12/26/17 continue 5mg /kg q2 weeksuntil 02/20/18, restarted on 04/03/2018 -Tamoxifen started in 10/2017 for HHTstopped 12/10/18 due toDVT  CURRENT THERAPY: -Blood transfusion for severe anemia secondary to GI bleedingas needed -ivferric gluconate every1-2 weeks as neededwith ferritin goal 100-200. Increased to weekly on 07/29/19.Reduced to every 2 weeks on 11/12/19 -RestartedAvastin/Zirabevq2weeks on 04/03/18. Reduced to every 4 weeks starting 09/25/19. Increased to every 2 weeks on 01/06/20. Increased dose to 250mg  on 01/20/20 -Amicar 1g bidstarting4/06/2018. Reduced to only as needed on 12/10/18 due toDVT. Restart daily on 06/16/20 due to increased bleeding. Increased to BID, then TID (07/07/20).   INTERVAL HISTORY:  Peggy House is here for a follow up of HHT and anemia. She presents to the clinic alone. She notes she is doing well, but fatigued. She notes she had 1 unit of blood last week and felt no different afterward. She notes her last episode of GI bleeding was a few weeks ago. She notes normal stool for last week and this week. She notes occasional mild nausea that is intermittent. She notes last bad epistaxis 2 days ago with clotting. She notes she is taking Amicar BID. She denies leg pain, but has LE edema, L>R.  She notes she his using topical antibiotic cream on her feet once a day. She  notes some itching of her foot.  She notes she has more SOB. She would like to restart inhaler. She notes she has Glucometer but not sure how to use it anymore. She notes her anxiety was triggered last night passing by where her husband was killed. She notes her insurance may stop her personal care service soon, but still needs it.    REVIEW OF SYSTEMS:   Constitutional: Denies fevers, chills or abnormal weight loss Eyes: Denies blurriness of vision Ears, nose, mouth, throat, and face: Denies mucositis or sore throat Respiratory: Denies cough or wheezes (+) SOB  Cardiovascular: Denies palpitation, chest discomfort or lower extremity swelling Gastrointestinal:  Denies nausea, heartburn or change in bowel habits Skin: Denies abnormal skin rashes Lymphatics: Denies new lymphadenopathy or easy bruising Neurological:Denies numbness, tingling or new weaknesses Behavioral/Psych: Mood is stable, no new changes  All other systems were reviewed with the patient and are negative.  MEDICAL HISTORY:  Past Medical History:  Diagnosis Date  . Anxiety   . Arthritis    knnes,back  . GERD (gastroesophageal reflux disease)   . 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 History of swelling of feet   . Hyperlipidemia   . Hypertension   . Major depressive disorder, recurrent episode ([place]) 06/05/2015  . Obesity   . Snores   . 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 ABDOMINAL HYSTERECTOMY    . CARPAL TUNNEL RELEASE  05/13/2011   Procedure: CARPAL TUNNEL RELEASE;  Surgeon: Nita Sells, MD;  Location: [place];  Service:  Orthopedics;  Laterality: Left;  . COLONOSCOPY N/A 03/02/2020   Procedure: COLONOSCOPY;  Surgeon: Ronald Lobo, MD;  Location: [facility] ENDOSCOPY;  Service: Endoscopy;  Laterality: N/A;  . COLONOSCOPY WITH PROPOFOL N/A 04/28/2014   Procedure: COLONOSCOPY WITH  PROPOFOL;  Surgeon: Cleotis Nipper, MD;  Location: [facility] ENDOSCOPY;  Service: Endoscopy;  Laterality: N/A;  . DG TOES*L*  2/10   rt  . DILATION AND CURETTAGE OF UTERUS    . ENTEROSCOPY N/A 10/17/2017   Procedure: ENTEROSCOPY;  Surgeon: Otis Brace, MD;  Location: [place] ENDOSCOPY;  Service: Gastroenterology;  Laterality: N/A;  . ESOPHAGOGASTRODUODENOSCOPY N/A 04/10/2014   Procedure: ESOPHAGOGASTRODUODENOSCOPY (EGD);  Surgeon: [name], MD;  Location: [facility] ENDOSCOPY;  Service: Endoscopy;  Laterality: N/A;  . ESOPHAGOGASTRODUODENOSCOPY N/A 05/10/2017   Procedure: ESOPHAGOGASTRODUODENOSCOPY (EGD);  Surgeon: Ronald Lobo, MD;  Location: [facility] ENDOSCOPY;  Service: Endoscopy;  Laterality: N/A;  . ESOPHAGOGASTRODUODENOSCOPY N/A 09/22/2017   Procedure: ESOPHAGOGASTRODUODENOSCOPY (EGD);  Surgeon: Clarene Essex, MD;  Location: [place];  Service: Endoscopy;  Laterality: N/A;  bedside  . ESOPHAGOGASTRODUODENOSCOPY N/A 03/02/2020   Procedure: ESOPHAGOGASTRODUODENOSCOPY (EGD);  Surgeon: Ronald Lobo, MD;  Location: [facility];  Service: Endoscopy;  Laterality: N/A;  . ESOPHAGOGASTRODUODENOSCOPY (EGD) WITH PROPOFOL N/A 04/27/2014   Procedure: ESOPHAGOGASTRODUODENOSCOPY (EGD) WITH PROPOFOL;  Surgeon: Cleotis Nipper, MD;  Location: [place];  Service: Endoscopy;  Laterality: N/A;  possible apc  . ESOPHAGOGASTRODUODENOSCOPY (EGD) WITH PROPOFOL N/A 09/30/2017   Procedure: ESOPHAGOGASTRODUODENOSCOPY (EGD) WITH PROPOFOL;  Surgeon: [name], MD;  Location: [place];  Service: Gastroenterology;  Laterality: N/A;  . ESOPHAGOGASTRODUODENOSCOPY (EGD) WITH PROPOFOL N/A 10/01/2017   Procedure: ESOPHAGOGASTRODUODENOSCOPY (EGD) WITH PROPOFOL;  Surgeon: [name], MD;  Location: [place];  Service: Gastroenterology;  Laterality: N/A;  . ESOPHAGOGASTRODUODENOSCOPY (EGD) WITH PROPOFOL N/A 10/08/2017   Procedure: ESOPHAGOGASTRODUODENOSCOPY (EGD) WITH PROPOFOL;  Surgeon: Otis Brace, MD;   Location: [facility] ENDOSCOPY;  Service: Gastroenterology;  Laterality: N/A;  . ESOPHAGOGASTRODUODENOSCOPY (EGD) WITH PROPOFOL N/A 10/17/2017   Procedure: ESOPHAGOGASTRODUODENOSCOPY (EGD) WITH PROPOFOL;  Surgeon: Otis Brace, MD;  Location: [facility] ENDOSCOPY;  Service: Gastroenterology;  Laterality: N/A;  . ESOPHAGOGASTRODUODENOSCOPY (EGD) WITH PROPOFOL N/A 10/19/2017   Procedure: ESOPHAGOGASTRODUODENOSCOPY (EGD) WITH PROPOFOL;  Surgeon: Otis Brace, MD;  Location: [facility] ENDOSCOPY;  Service: Gastroenterology;  Laterality: N/A;  . ESOPHAGOGASTRODUODENOSCOPY (EGD) WITH PROPOFOL N/A 12/04/2018   Procedure: ESOPHAGOGASTRODUODENOSCOPY (EGD) WITH PROPOFOL;  Surgeon: Wilford Corner, MD;  [facility]: [facility];  Service: Endoscopy;  Laterality: N/A;  . GIVENS CAPSULE STUDY N/A 10/02/2017   Procedure: GIVENS CAPSULE STUDY;  Surgeon: [name], MD;  Location: [place];  Service: Gastroenterology;  Laterality: N/A;  . GIVENS CAPSULE STUDY N/A 10/08/2017   Procedure: GIVENS CAPSULE STUDY;  Surgeon: Otis Brace, MD;  Location: [place];  Service: Gastroenterology;  Laterality: N/A;  endoscopic placement of capsule  . GIVENS CAPSULE STUDY N/A 03/02/2020   Procedure: GIVENS CAPSULE STUDY;  Surgeon: Ronald Lobo, MD;  Location: [facility];  Service: Endoscopy;  Laterality: N/A;  . HEMOSTASIS CLIP PLACEMENT  12/04/2018   Procedure: HEMOSTASIS CLIP PLACEMENT;  Surgeon: Wilford Corner, MD;  Location[facility];  Service: Endoscopy;;  . HOT HEMOSTASIS N/A 04/27/2014   Procedure: HOT HEMOSTASIS (ARGON PLASMA COAGULATION/BICAP);  Surgeon: Cleotis Nipper, MD;  [facility];  Service: Endoscopy;  Laterality: N/A;  . HOT HEMOSTASIS N/A 09/30/2017   Procedure: HOT HEMOSTASIS (ARGON PLASMA COAGULATION/BICAP);  Surgeon: [name], MD;  Location: [place];  Service: Gastroenterology;  Laterality: N/A;  . HOT HEMOSTASIS N/A 10/01/2017   Procedure:  HOT HEMOSTASIS (ARGON PLASMA  COAGULATION/BICAP);  Surgeon: [name], MD;  Location: [place];  Service: Gastroenterology;  Laterality: N/A;  . HOT HEMOSTASIS N/A 10/17/2017   Procedure: HOT HEMOSTASIS (ARGON PLASMA COAGULATION/BICAP);  Surgeon: Otis Brace, MD;  Location: [facility] ENDOSCOPY;  Service: Gastroenterology;  Laterality: N/A;  . HOT HEMOSTASIS N/A 10/19/2017   Procedure: HOT HEMOSTASIS (ARGON PLASMA COAGULATION/BICAP);  Surgeon: Otis Brace, MD;  Location: [facility] ENDOSCOPY;  Service: Gastroenterology;  Laterality: N/A;  . HOT HEMOSTASIS N/A 03/02/2020   Procedure: HOT HEMOSTASIS (ARGON PLASMA COAGULATION/BICAP);  Surgeon: Ronald Lobo, MD;  Location[facility];  Service: Endoscopy;  Laterality: N/A;  . IR IMAGING GUIDED PORT INSERTION  07/08/2018  . L shoulder Surgery  2011  . POLYPECTOMY  03/02/2020   Procedure: POLYPECTOMY;  Surgeon: Ronald Lobo, MD;  Location: [facility];  Service: Endoscopy;;  . SUBMUCOSAL INJECTION  09/22/2017   Procedure: SUBMUCOSAL INJECTION;  Surgeon: Clarene Essex, MD;  Location: [place];  Service: Endoscopy;;  . SUBMUCOSAL INJECTION  12/04/2018   Procedure: SUBMUCOSAL INJECTION;  Surgeon: Wilford Corner, MD;  Location: [facility]OS[facility];  Service: Endoscopy;;    I have reviewed the social history and family history with the patient and they are unchanged from previous note.  ALLERGIES:  is allergic to feraheme [ferumoxytol], nsaids, tomato, and wasp venom.  MEDICATIONS:  Current Outpatient Medications  Medication Sig Dispense Refill  . Accu-Chek Softclix Lancets lancets Use to check blood sugar before breakfast and before dinner while on steroids 100 each 1  . acitretin (SORIATANE) 10 MG capsule Take 10 mg by mouth daily.    [name] albuterol (VENTOLIN HFA) 108 (90 Base) MCG/ACT inhaler Inhale 1-2 puffs into the lungs every 6 (six) hours as needed for wheezing or shortness of breath. 1 each 1  . ALPRAZolam (XANAX) 0.5 MG tablet Take 1 tablet (0.5 mg total) by mouth at  bedtime as needed for anxiety. 30 tablet 0  . Aminocaproic Acid 1000 MG TABS Take 1 tablet (1,000 mg total) by mouth in the morning and at bedtime. TAKE 1 TABLET(1000 MG) BY MOUTH TWICE DAILY 60 tablet 3  . amLODipine (NORVASC) 10 MG tablet TAKE 1 TABLET(10 MG) BY MOUTH DAILY 90 tablet 0  . atorvastatin (LIPITOR) 80 MG tablet TAKE 1 TABLET(80 MG) BY MOUTH DAILY (Patient taking differently: Take 80 mg by mouth daily. TAKE 1 TABLET(80 MG) BY MOUTH DAILY) 30 tablet 3  . cetirizine (ZYRTEC) 10 MG tablet TAKE 1 TABLET(10 MG) BY MOUTH DAILY 90 tablet 1  . diphenoxylate-atropine (LOMOTIL) 2.5-0.025 MG tablet 1 to 2 PO QID prn diarrhea (Patient taking differently: Take 1-2 tablets by mouth 4 (four) times daily as needed for diarrhea or loose stools.) 30 tablet 1  . glucose blood (ACCU-CHEK GUIDE) test strip Check blood sugar 2 times per day while on steroids before breakfast and dinner 50 each 3  . hydrochlorothiazide (HYDRODIURIL) 12.5 MG tablet TAKE 1 TABLET(12.5 MG) BY MOUTH DAILY 90 tablet 1  . lidocaine-prilocaine (EMLA) cream Apply 1 application topically as needed. 30 g 0  . metFORMIN (GLUCOPHAGE) 500 MG tablet Take 1 tablet (500 mg total) by mouth 2 (two) times daily with a meal. 180 tablet 3  . pantoprazole (PROTONIX) 40 MG tablet TAKE 1 TABLET(40 MG) BY MOUTH TWICE DAILY 60 tablet 5  . Podiatric Products (FLEXITOL HEEL BALM) OINT Apply 1 application topically as needed (dry skin).     . potassium chloride (KLOR-CON) 20 MEQ packet Take 20 mEq by mouth daily. 30 packet 1  .  prochlorperazine (COMPAZINE) 10 MG tablet Take 1 tablet (10 mg total) by mouth every 6 (six) hours as needed for nausea or vomiting. 30 tablet 1  . sertraline (ZOLOFT) 100 MG tablet TAKE 1 TABLET(100 MG) BY MOUTH DAILY (Patient taking differently: Take 100 mg by mouth daily.) 90 tablet 1  . terbinafine (LAMISIL AT) 1 % cream Apply 1 application topically 2 (two) times daily. Both bottom and top of both feet and toes 36 g 0  .  traZODone (DESYREL) 100 MG tablet Take 1 tablet (100 mg total) by mouth at bedtime. 30 tablet 2   No current facility-administered medications for this visit.    PHYSICAL EXAMINATION: ECOG PERFORMANCE STATUS: 3 - Symptomatic, >50% confined to bed  Vitals:   07/07/20 1408  BP: 138/73  Pulse: 83  Resp: 17  Temp: 97.9 F (36.6 C)  SpO2: 98%   Filed Weights   07/07/20 1408  Weight: 254 lb 4.8 oz (115.3 kg)    GENERAL:alert, no distress and comfortable SKIN: skin color, texture, turgor are normal, no rashes or significant lesions EYES: normal, Conjunctiva are pink and non-injected, sclera clear  NECK: supple, thyroid normal size, non-tender, without nodularity LYMPH:  no palpable lymphadenopathy in the cervical, axillary or inguinal LUNGS: clear to auscultation and percussion with normal breathing effort HEART: regular rate & rhythm and no murmurs (+) lower extremity edema ABDOMEN:abdomen soft, non-tender and normal bowel sounds Musculoskeletal:no cyanosis of digits and no clubbing  NEURO: alert & oriented x 3 with fluent speech, no focal motor/sensory deficits  LABORATORY DATA:  I have reviewed the data as listed CBC Latest Ref Rng & Units 07/07/2020 06/30/2020 06/23/2020  WBC 4.0 - 10.5 K/uL 14.3(H) 11.1(H) 12.6(H)  Hemoglobin 12.0 - 15.0 g/dL 7.6(L) 6.8(LL) 7.3(L)  Hematocrit 36.0 - 46.0 % 26.2(L) 23.7(L) 25.6(L)  Platelets 150 - 400 K/uL 238 302 334     CMP Latest Ref Rng & Units 06/23/2020 06/16/2020 06/09/2020  Glucose 70 - 99 mg/dL 119(H) 113(H) 104(H)  BUN 6 - 20 mg/dL 12 9 10   Creatinine 0.44 - 1.00 mg/dL 0.92 0.78 0.78  Sodium 135 - 145 mmol/L 145 142 140  Potassium 3.5 - 5.1 mmol/L 3.4(L) 3.6 3.6  Chloride 98 - 111 mmol/L 109 107 107  CO2 22 - 32 mmol/L 23 24 24   Calcium 8.9 - 10.3 mg/dL 9.1 9.0 9.1  Total Protein 6.5 - 8.1 g/dL 7.4 7.4 7.1  Total Bilirubin 0.3 - 1.2 mg/dL <0.2(L) <0.2(L) <0.2(L)  Alkaline Phos 38 - 126 U/L 68 74 71  AST 15 - 41 U/L 12(L) 13(L)  11(L)  ALT 0 - 44 U/L 6 7 7       RADIOGRAPHIC STUDIES: I have personally reviewed the radiological images as listed and agreed with the findings in the report. No results found.   ASSESSMENT & PLAN:  Peggy House is a 60 y.o. female with    1. 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 Extensive workup found the pt to have HHT based on her personal and familyhistoryand recurrentAVM GI bleedings. -Sheis s/pIR embolization of the left Gastric and short gastric artery branchvesselswithout evidence of residual flow in the Dieulafoy lesionon 12/10/2017. -She has been on bevacizumabevery2-4 week,treatment has been intermittently held due to large proteinuria. She has been on IV Ferric Gluconate every 1-2 weeks (unless Ferritin >200)and blood transfusions as needed.We repeatedly discussed consistency  in treatments to manage her blood counts, she voices good understanding. -She has not had GI bleeding in recent weeks but continues to have intermittent moderate epistaxis. She received blood transfusion on 07/05/19, but her fatigue is the same. Labs reviewed, WBC 14.3, Hg 7.6, ANC 11.6. Iron panel still pending. Will proceed with Ferric Gluconate today  -She will continue Amicar 1mg , increase to TID given her increased bleeding.  -Continue weekly labs andFerricGluconate 250mg  (unlessFerritin >200on last lab). Continue Beva (if urine protein <300) every 2 weeks  -F/u4 weeks.   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 She is also on blood transfusionsas needed. Has received more frequently given increased blood loss. Last Blood transfusion was3/22/22.  -Hg 7.6 today, will proceed with blood transfusion this week. Continue Amicar TID   3. Reactive Leukocytosis  -She has mild leukocytosis with dominant neutrophils, likely reactive. Her prior thrombocytosis resolved.  -Mostly mild and stable.   4. 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 She required cauterization for stomach for severe Gi bleeding in 11/2018 -Continue to follow up with her GI, Dr. Cristina Gong -I started heron Amicar 07/17/18. Given DVT she will only take Amicar for severe epistaxis -She had recurrence in GI bleeding this weekend with black stool and mentioned increased epistaxis recently (04/28/20). I strongly encouraged her to contact my office when this occurs.  5.History of right Arm DVT -She had right arm DVT that extended to her right clavicleon 12/07/2018.  -She is not a candidate for anticoagulation due to severe GI bleeding. We stopped Tamoxifen, will continue low doseAmicar for epistaxis and GI bleeding. Will maintain Port flushes.  6. Smoking cessation -She was smoking 10 cigarettes a day on average before recent hospitalization -I strongly encouraged her to stop smoking completely, she will try.  7. Hypokalemia -Onpotassium chloride 20 mEq daily, continue. -Has resolved in 05/2020.   8. HTN, uncontrolled, Recently diagnosed DM -Continue medications. Will monitor on Avastin.  -Continue to f/u with PCP   9. Anxiety, Insomnia  -She has been stressed with family health issues (her husband's passing August 2021 and son's DM on transplant list). She has been more anxious and started having panic attacks.  -She is fine to use Xanax at 0.5mg  dose as needed, not daily. F/u with SW as needed for counseling.  -She  recently started Trazadone (06/15/20). This has helped her sleep better.   10. B/l LE Edema, Fungal infection of feet,inguinal reactivelymphadenopathy -Stable. Continue compression socksand leg elevation -She had a recent US of leg for her swelling, no DVT but she was found to havelymphadenopathy of b/l groin. She does have skin darkening of her legs which she has had in recent months. She also has fungal infection of her feet with non-palpable inguinal Lymphadenopathy.  -She is currently on Lamisil topical cream for her infection. She will continue to f/u with Podiatrist -Her inguinal LN are not palpable on exam today (07/07/20)   PLAN:  -Proceed with Ferric Gluconate today  -Blood transfusion 1u tomorrow  -Continue  Amicar 1g, increase to TID  -flush,  labs andFerricGluconate 250mg  (unlessFerritin >200 on last lab) weekly X5 -GiveBevaevery 2 weeks(if urine protein <300), next in a  week  -F/u in 4 weeks.  -She will let us know if she needs her personal care services renewed.    No problem-specific Assessment & Plan notes found for this encounter.   No orders of the defined types were placed in this encounter.  All questions were answered. The patient knows to call the clinic with any problems, questions or concerns. No barriers to learning was detected.      Truitt Merle, MD 07/07/2020   I, Joslyn Devon, am acting as scribe for Truitt Merle, MD.   I have reviewed the above documentation for accuracy and completeness, and I agree with the above.

## 2020-07-05 NOTE — Telephone Encounter (Signed)
Spoke with Lelon Frohlich at front desk to clarify message below, she states patient is wanting to speak to someone about restarting PCS and there is another person in the patient's home that is able to communicate with nursing staff at this time.  Forwarding to [employer] and Lela. Thank you, Stacee

## 2020-07-05 NOTE — Telephone Encounter (Signed)
t is requesting a call back in regards to a letter stating she is in need of HHA care due to her services has laps

## 2020-07-06 ENCOUNTER — Telehealth: Payer: Self-pay

## 2020-07-06 NOTE — Telephone Encounter (Signed)
Message from [place], patient wanting PCS that she in needing someone to help her with dressing,cooking,soaking her feet,taking out the trash. I told patient that I will have to speak with the nurse if there is a waiting time period to resend and will get back in contact with her.

## 2020-07-06 NOTE — Telephone Encounter (Signed)
Pt is calling again about getting her HHA services restarted due to they had laps

## 2020-07-06 NOTE — Telephone Encounter (Signed)
Return pt's call - asking about PCS services [facility]). I asked if Lela called her to let her know why she was denied - she stated yes. Pt stated she needs help - she's unable to hold he down b/c her nose will start bleeding (like sweeping,picking up things), needs help with cooking esp breakfast, sweeping, putting on support stockings, bathing. I will ask Lela to f/u.

## 2020-07-06 NOTE — Telephone Encounter (Signed)
PCS forms were faxed to [facility] on 06/19/2020. When they did their phone assessment with her they found she did not need help with ADL's. All she is wanting help with is running errands (grocery store, doctor's appts.) That is not allowable by PCS. Patient was made aware of this when she spoke with Lela on 07/03/2020 following the letter she received from [facility] denying her request.

## 2020-07-07 ENCOUNTER — Inpatient Hospital Stay: Payer: Medicaid Other

## 2020-07-07 ENCOUNTER — Inpatient Hospital Stay (HOSPITAL_BASED_OUTPATIENT_CLINIC_OR_DEPARTMENT_OTHER): Payer: Medicaid Other | Admitting: Hematology

## 2020-07-07 ENCOUNTER — Other Ambulatory Visit: Payer: Self-pay

## 2020-07-07 ENCOUNTER — Encounter: Payer: Self-pay | Admitting: Hematology

## 2020-07-07 VITALS — BP 146/67 | HR 72 | Temp 98.3°F | Resp 18

## 2020-07-07 VITALS — BP 138/73 | HR 83 | Temp 97.9°F | Resp 17 | Ht 66.0 in | Wt 254.3 lb

## 2020-07-07 DIAGNOSIS — I780 Hereditary hemorrhagic telangiectasia: Secondary | ICD-10-CM | POA: Diagnosis not present

## 2020-07-07 DIAGNOSIS — R808 Other proteinuria: Secondary | ICD-10-CM

## 2020-07-07 DIAGNOSIS — D500 Iron deficiency anemia secondary to blood loss (chronic): Secondary | ICD-10-CM

## 2020-07-07 DIAGNOSIS — Z95828 Presence of other vascular implants and grafts: Secondary | ICD-10-CM

## 2020-07-07 DIAGNOSIS — Z5112 Encounter for antineoplastic immunotherapy: Secondary | ICD-10-CM | POA: Diagnosis not present

## 2020-07-07 LAB — CBC WITH DIFFERENTIAL (CANCER CENTER ONLY)
Abs Immature Granulocytes: 0.08 10*3/uL — ABNORMAL HIGH (ref 0.00–0.07)
Basophils Absolute: 0.1 10*3/uL (ref 0.0–0.1)
Basophils Relative: 0 %
Eosinophils Absolute: 0.1 10*3/uL (ref 0.0–0.5)
Eosinophils Relative: 1 %
HCT: 26.2 % — ABNORMAL LOW (ref 36.0–46.0)
Hemoglobin: 7.6 g/dL — ABNORMAL LOW (ref 12.0–15.0)
Immature Granulocytes: 1 %
Lymphocytes Relative: 13 %
Lymphs Abs: 1.8 10*3/uL (ref 0.7–4.0)
MCH: 26.2 pg (ref 26.0–34.0)
MCHC: 29 g/dL — ABNORMAL LOW (ref 30.0–36.0)
MCV: 90.3 fL (ref 80.0–100.0)
Monocytes Absolute: 0.7 10*3/uL (ref 0.1–1.0)
Monocytes Relative: 5 %
Neutro Abs: 11.6 10*3/uL — ABNORMAL HIGH (ref 1.7–7.7)
Neutrophils Relative %: 80 %
Platelet Count: 238 10*3/uL (ref 150–400)
RBC: 2.9 MIL/uL — ABNORMAL LOW (ref 3.87–5.11)
RDW: 22.1 % — ABNORMAL HIGH (ref 11.5–15.5)
WBC Count: 14.3 10*3/uL — ABNORMAL HIGH (ref 4.0–10.5)
nRBC: 0.1 % (ref 0.0–0.2)

## 2020-07-07 LAB — FERRITIN: Ferritin: 163 ng/mL (ref 11–307)

## 2020-07-07 LAB — SAMPLE TO BLOOD BANK

## 2020-07-07 LAB — PREPARE RBC (CROSSMATCH)

## 2020-07-07 MED ORDER — SODIUM CHLORIDE 0.9 % IV SOLN
Freq: Once | INTRAVENOUS | Status: AC
  Filled 2020-07-07: qty 250

## 2020-07-07 MED ORDER — SODIUM CHLORIDE 0.9% FLUSH
10.0000 mL | Freq: Once | INTRAVENOUS | Status: AC
  Administered 2020-07-07: 10 mL
  Filled 2020-07-07: qty 10

## 2020-07-07 MED ORDER — SODIUM CHLORIDE 0.9 % IV SOLN
250.0000 mg | Freq: Once | INTRAVENOUS | Status: AC
  Administered 2020-07-07: 250 mg via INTRAVENOUS
  Filled 2020-07-07: qty 20

## 2020-07-07 MED ORDER — HEPARIN SOD (PORK) LOCK FLUSH 100 UNIT/ML IV SOLN
500.0000 [IU] | Freq: Once | INTRAVENOUS | Status: AC
  Administered 2020-07-07: 500 [IU]
  Filled 2020-07-07: qty 5

## 2020-07-07 MED ORDER — ALBUTEROL SULFATE HFA 108 (90 BASE) MCG/ACT IN AERS: 1.0000 | INHALATION_SPRAY | Freq: Four times a day (QID) | RESPIRATORY_TRACT | 1 refills | Status: DC | PRN

## 2020-07-07 NOTE — Patient Instructions (Signed)
Sodium Ferric Gluconate Complex injection What is this medicine? SODIUM FERRIC GLUCONATE COMPLEX (SOE dee um FER ik GLOO koe nate KOM pleks) is an iron replacement. It is used with epoetin therapy to treat low iron levels in patients who are receiving hemodialysis. This medicine may be used for other purposes; ask your health care provider or pharmacist if you have questions. COMMON BRAND NAME(S): Ferrlecit, Nulecit What should I tell my health care provider before I take this medicine? They need to know if you have any of the following conditions:  anemia that is not from iron deficiency  high levels of iron in the body  an unusual or allergic reaction to iron, benzyl alcohol, other medicines, foods, dyes, or preservatives  pregnant or are trying to become pregnant  breast-feeding How should I use this medicine? This medicine is for infusion into a vein. It is given by a health care professional in a hospital or clinic setting. Talk to your pediatrician regarding the use of this medicine in children. While this drug may be prescribed for children as young as 6 years old for selected conditions, precautions do apply. Overdosage: If you think you have taken too much of this medicine contact a poison control center or emergency room at once. NOTE: This medicine is only for you. Do not share this medicine with others. What if I miss a dose? It is important not to miss your dose. Call your doctor or health care professional if you are unable to keep an appointment. What may interact with this medicine? Do not take this medicine with any of the following medications:  deferoxamine  dimercaprol  other iron products This medicine may also interact with the following medications:  chloramphenicol  deferasirox  medicine for blood pressure like enalapril This list may not describe all possible interactions. Give your health care provider a list of all the medicines, herbs,  non-prescription drugs, or dietary supplements you use. Also tell them if you smoke, drink alcohol, or use illegal drugs. Some items may interact with your medicine. What should I watch for while using this medicine? Your condition will be monitored carefully while you are receiving this medicine. Visit your doctor for check-ups as directed. What side effects may I notice from receiving this medicine? Side effects that you should report to your doctor or health care professional as soon as possible:  allergic reactions like skin rash, itching or hives, swelling of the face, lips, or tongue  breathing problems  changes in hearing  changes in vision  chills, flushing, or sweating  fast, irregular heartbeat  feeling faint or lightheaded, falls  fever, flu-like symptoms  high or low blood pressure  pain, tingling, numbness in the hands or feet  severe pain in the chest, back, flanks, or groin  swelling of the ankles, feet, hands  trouble passing urine or change in the amount of urine  unusually weak or tired Side effects that usually do not require medical attention (report to your doctor or health care professional if they continue or are bothersome):  cramps  dark colored stools  diarrhea  headache  nausea, vomiting  stomach upset This list may not describe all possible side effects. Call your doctor for medical advice about side effects. You may report side effects to FDA at 1-800-FDA-1088. Where should I keep my medicine? This drug is given in a hospital or clinic and will not be stored at home. NOTE: This sheet is a summary. It may not cover all   possible information. If you have questions about this medicine, talk to your doctor, pharmacist, or health care provider.  2021 Elsevier/Gold Standard (2007-12-02 15:58:57)   

## 2020-07-07 NOTE — Progress Notes (Signed)
Patient did not wish to stay for 30-minute observation period. Patient d/c in stable condition. VS stable. AVS printed.

## 2020-07-07 NOTE — Telephone Encounter (Signed)
Call placed to [place] at [facility] [phone]. Recording states she is out of office till 07/10/2020 and secure VMB is full. Spoke with Niches at [employer]. She was not able to provide any information on PCS. Call placed to patient. She will call Erica on 07/10/2020 to discuss why PCS was denied and if there is anything else she can do.

## 2020-07-08 ENCOUNTER — Other Ambulatory Visit: Payer: Self-pay

## 2020-07-08 ENCOUNTER — Inpatient Hospital Stay: Payer: Medicaid Other

## 2020-07-08 DIAGNOSIS — D500 Iron deficiency anemia secondary to blood loss (chronic): Secondary | ICD-10-CM

## 2020-07-08 DIAGNOSIS — Z5112 Encounter for antineoplastic immunotherapy: Secondary | ICD-10-CM | POA: Diagnosis not present

## 2020-07-08 MED ORDER — DIPHENHYDRAMINE HCL 25 MG PO CAPS
25.0000 mg | ORAL_CAPSULE | Freq: Once | ORAL | Status: AC
  Administered 2020-07-08: 25 mg via ORAL

## 2020-07-08 MED ORDER — SODIUM CHLORIDE 0.9% IV SOLUTION
250.0000 mL | Freq: Once | INTRAVENOUS | Status: AC
  Administered 2020-07-08: 250 mL via INTRAVENOUS
  Filled 2020-07-08: qty 250

## 2020-07-08 MED ORDER — ACETAMINOPHEN 325 MG PO TABS
650.0000 mg | ORAL_TABLET | Freq: Once | ORAL | Status: AC
  Administered 2020-07-08: 650 mg via ORAL

## 2020-07-08 MED ORDER — SODIUM CHLORIDE 0.9% FLUSH
10.0000 mL | INTRAVENOUS | Status: AC | PRN
  Administered 2020-07-08: 10 mL
  Filled 2020-07-08: qty 10

## 2020-07-08 MED ORDER — HEPARIN SOD (PORK) LOCK FLUSH 100 UNIT/ML IV SOLN
500.0000 [IU] | Freq: Every day | INTRAVENOUS | Status: AC | PRN
  Administered 2020-07-08: 500 [IU]
  Filled 2020-07-08: qty 5

## 2020-07-09 LAB — TYPE AND SCREEN
ABO/RH(D): A NEG
Antibody Screen: NEGATIVE
Unit division: 0

## 2020-07-09 LAB — BPAM RBC
Blood Product Expiration Date: 202204042359
ISSUE DATE / TIME: 202203260958
Unit Type and Rh: 600

## 2020-07-11 ENCOUNTER — Telehealth: Payer: Self-pay | Admitting: Hematology

## 2020-07-11 DIAGNOSIS — N319 Neuromuscular dysfunction of bladder, unspecified: Secondary | ICD-10-CM | POA: Diagnosis not present

## 2020-07-11 DIAGNOSIS — N3941 Urge incontinence: Secondary | ICD-10-CM | POA: Diagnosis not present

## 2020-07-11 NOTE — Telephone Encounter (Signed)
Requesting to speak with a nurse about PCS form.

## 2020-07-11 NOTE — Telephone Encounter (Signed)
Scheduled follow-up appointments per 3/25 los. Patient is aware.

## 2020-07-14 ENCOUNTER — Inpatient Hospital Stay: Payer: Medicaid Other

## 2020-07-14 ENCOUNTER — Other Ambulatory Visit: Payer: Self-pay

## 2020-07-14 ENCOUNTER — Inpatient Hospital Stay: Payer: Medicaid Other | Attending: Hematology

## 2020-07-14 VITALS — BP 164/74 | HR 68 | Temp 97.1°F | Resp 18

## 2020-07-14 DIAGNOSIS — I780 Hereditary hemorrhagic telangiectasia: Secondary | ICD-10-CM | POA: Diagnosis not present

## 2020-07-14 DIAGNOSIS — Z95828 Presence of other vascular implants and grafts: Secondary | ICD-10-CM

## 2020-07-14 DIAGNOSIS — Z5112 Encounter for antineoplastic immunotherapy: Secondary | ICD-10-CM | POA: Diagnosis not present

## 2020-07-14 DIAGNOSIS — Z79899 Other long term (current) drug therapy: Secondary | ICD-10-CM | POA: Insufficient documentation

## 2020-07-14 DIAGNOSIS — R808 Other proteinuria: Secondary | ICD-10-CM

## 2020-07-14 DIAGNOSIS — D500 Iron deficiency anemia secondary to blood loss (chronic): Secondary | ICD-10-CM

## 2020-07-14 LAB — IRON AND TIBC
Iron: 40 ug/dL — ABNORMAL LOW (ref 41–142)
Saturation Ratios: 15 % — ABNORMAL LOW (ref 21–57)
TIBC: 266 ug/dL (ref 236–444)
UIBC: 226 ug/dL (ref 120–384)

## 2020-07-14 LAB — CBC WITH DIFFERENTIAL (CANCER CENTER ONLY)
Abs Immature Granulocytes: 0.03 10*3/uL (ref 0.00–0.07)
Basophils Absolute: 0 10*3/uL (ref 0.0–0.1)
Basophils Relative: 0 %
Eosinophils Absolute: 0.2 10*3/uL (ref 0.0–0.5)
Eosinophils Relative: 1 %
HCT: 33.5 % — ABNORMAL LOW (ref 36.0–46.0)
Hemoglobin: 9.7 g/dL — ABNORMAL LOW (ref 12.0–15.0)
Immature Granulocytes: 0 %
Lymphocytes Relative: 14 %
Lymphs Abs: 1.7 10*3/uL (ref 0.7–4.0)
MCH: 25.8 pg — ABNORMAL LOW (ref 26.0–34.0)
MCHC: 29 g/dL — ABNORMAL LOW (ref 30.0–36.0)
MCV: 89.1 fL (ref 80.0–100.0)
Monocytes Absolute: 0.6 10*3/uL (ref 0.1–1.0)
Monocytes Relative: 5 %
Neutro Abs: 9.3 10*3/uL — ABNORMAL HIGH (ref 1.7–7.7)
Neutrophils Relative %: 80 %
Platelet Count: 290 10*3/uL (ref 150–400)
RBC: 3.76 MIL/uL — ABNORMAL LOW (ref 3.87–5.11)
RDW: 21.2 % — ABNORMAL HIGH (ref 11.5–15.5)
WBC Count: 11.8 10*3/uL — ABNORMAL HIGH (ref 4.0–10.5)
nRBC: 0 % (ref 0.0–0.2)

## 2020-07-14 LAB — CMP (CANCER CENTER ONLY)
ALT: 8 U/L (ref 0–44)
AST: 13 U/L — ABNORMAL LOW (ref 15–41)
Albumin: 3.6 g/dL (ref 3.5–5.0)
Alkaline Phosphatase: 74 U/L (ref 38–126)
Anion gap: 12 (ref 5–15)
BUN: 9 mg/dL (ref 6–20)
CO2: 24 mmol/L (ref 22–32)
Calcium: 8.8 mg/dL — ABNORMAL LOW (ref 8.9–10.3)
Chloride: 106 mmol/L (ref 98–111)
Creatinine: 0.8 mg/dL (ref 0.44–1.00)
GFR, Estimated: >60 mL/min (ref >60)
Glucose, Bld: 123 mg/dL — ABNORMAL HIGH (ref 70–99)
Potassium: 3.8 mmol/L (ref 3.5–5.1)
Sodium: 142 mmol/L (ref 135–145)
Total Bilirubin: 0.3 mg/dL (ref 0.3–1.2)
Total Protein: 7.6 g/dL (ref 6.5–8.1)

## 2020-07-14 LAB — TOTAL PROTEIN, URINE DIPSTICK: Protein, ur: 100 mg/dL — AB

## 2020-07-14 LAB — SAMPLE TO BLOOD BANK

## 2020-07-14 LAB — FERRITIN: Ferritin: 208 ng/mL (ref 11–307)

## 2020-07-14 MED ORDER — SODIUM CHLORIDE 0.9% FLUSH
10.0000 mL | Freq: Once | INTRAVENOUS | Status: AC
  Administered 2020-07-14: 10 mL
  Filled 2020-07-14: qty 10

## 2020-07-14 MED ORDER — SODIUM CHLORIDE 0.9 % IV SOLN
250.0000 mg | Freq: Once | INTRAVENOUS | Status: AC
  Administered 2020-07-14: 250 mg via INTRAVENOUS
  Filled 2020-07-14: qty 20

## 2020-07-14 MED ORDER — SODIUM CHLORIDE 0.9% FLUSH
10.0000 mL | INTRAVENOUS | Status: DC | PRN
  Administered 2020-07-14: 10 mL
  Filled 2020-07-14: qty 10

## 2020-07-14 MED ORDER — SODIUM CHLORIDE 0.9 % IV SOLN
5.0000 mg/kg | Freq: Once | INTRAVENOUS | Status: AC
  Administered 2020-07-14: 600 mg via INTRAVENOUS
  Filled 2020-07-14: qty 16

## 2020-07-14 MED ORDER — SODIUM CHLORIDE 0.9 % IV SOLN
Freq: Once | INTRAVENOUS | Status: AC
  Filled 2020-07-14: qty 250

## 2020-07-14 MED ORDER — HEPARIN SOD (PORK) LOCK FLUSH 100 UNIT/ML IV SOLN
500.0000 [IU] | Freq: Once | INTRAVENOUS | Status: AC | PRN
  Administered 2020-07-14: 500 [IU]
  Filled 2020-07-14: qty 5

## 2020-07-14 NOTE — Patient Instructions (Addendum)
[facility] Discharge Instructions for Patients Receiving Chemotherapy  Today you received the following chemotherapy agents Bevacizumab(Avastin)  To help prevent nausea and vomiting after your treatment, we encourage you to take your nausea medication as directed.   If you develop nausea and vomiting that is not controlled by your nausea medication, call the clinic.   BELOW ARE SYMPTOMS THAT SHOULD BE REPORTED IMMEDIATELY:  *FEVER GREATER THAN 100.5 F  *CHILLS WITH OR WITHOUT FEVER  NAUSEA AND VOMITING THAT IS NOT CONTROLLED WITH YOUR NAUSEA MEDICATION  *UNUSUAL SHORTNESS OF BREATH  *UNUSUAL BRUISING OR BLEEDING  TENDERNESS IN MOUTH AND THROAT WITH OR WITHOUT PRESENCE OF ULCERS  *URINARY PROBLEMS  *BOWEL PROBLEMS  UNUSUAL RASH Items with * indicate a potential emergency and should be followed up as soon as possible.  Feel free to call the clinic should you have any questions or concerns. The clinic phone number is [phone].  Please show the [place] at check-in to the Emergency Department and triage nurse.  Iron Sucrose injection What is this medicine? IRON SUCROSE (AHY ern SOO krohs) is an iron complex. Iron is used to make healthy red blood cells, which carry oxygen and nutrients throughout the body. This medicine is used to treat iron deficiency anemia in people with chronic kidney disease. This medicine may be used for other purposes; ask your health care provider or pharmacist if you have questions. COMMON BRAND NAME(S): Venofer What should I tell my health care provider before I take this medicine? They need to know if you have any of these conditions: anemia not caused by low iron levels heart disease high levels of iron in the blood kidney disease liver disease an unusual or allergic reaction to iron, other medicines, foods, dyes, or preservatives pregnant or trying to get pregnant breast-feeding How should I use this  medicine? This medicine is for infusion into a vein. It is given by a health care professional in a hospital or clinic setting. Talk to your pediatrician regarding the use of this medicine in children. While this drug may be prescribed for children as young as 2 years for selected conditions, precautions do apply. Overdosage: If you think you have taken too much of this medicine contact a poison control center or emergency room at once. NOTE: This medicine is only for you. Do not share this medicine with others. What if I miss a dose? It is important not to miss your dose. Call your doctor or health care professional if you are unable to keep an appointment. What may interact with this medicine? Do not take this medicine with any of the following medications: deferoxamine dimercaprol other iron products This medicine may also interact with the following medications: chloramphenicol deferasirox This list may not describe all possible interactions. Give your health care provider a list of all the medicines, herbs, non-prescription drugs, or dietary supplements you use. Also tell them if you smoke, drink alcohol, or use illegal drugs. Some items may interact with your medicine. What should I watch for while using this medicine? Visit your doctor or healthcare professional regularly. Tell your doctor or healthcare professional if your symptoms do not start to get better or if they get worse. You may need blood work done while you are taking this medicine. You may need to follow a special diet. Talk to your doctor. Foods that contain iron include: whole grains/cereals, dried fruits, beans, or peas, leafy green vegetables, and organ meats (liver, kidney). What side effects  may I notice from receiving this medicine? Side effects that you should report to your doctor or health care professional as soon as possible: allergic reactions like skin rash, itching or hives, swelling of the face, lips, or  tongue breathing problems changes in blood pressure cough fast, irregular heartbeat feeling faint or lightheaded, falls fever or chills flushing, sweating, or hot feelings joint or muscle aches/pains seizures swelling of the ankles or feet unusually weak or tired Side effects that usually do not require medical attention (report to your doctor or health care professional if they continue or are bothersome): diarrhea feeling achy headache irritation at site where injected nausea, vomiting stomach upset tiredness This list may not describe all possible side effects. Call your doctor for medical advice about side effects. You may report side effects to FDA at 1-800-FDA-1088. Where should I keep my medicine? This drug is given in a hospital or clinic and will not be stored at home. NOTE: This sheet is a summary. It may not cover all possible information. If you have questions about this medicine, talk to your doctor, pharmacist, or health care provider.  2021 Elsevier/Gold Standard (2011-01-10 17:14  Sodium Ferric Gluconate Complex injection What is this medicine? SODIUM FERRIC GLUCONATE COMPLEX (SOE dee um FER ik GLOO koe nate KOM pleks) is an iron replacement. It is used with epoetin therapy to treat low iron levels in patients who are receiving hemodialysis. This medicine may be used for other purposes; ask your health care provider or pharmacist if you have questions. COMMON BRAND NAME(S): Ferrlecit, Nulecit What should I tell my health care provider before I take this medicine? They need to know if you have any of the following conditions:  anemia that is not from iron deficiency  high levels of iron in the body  an unusual or allergic reaction to iron, benzyl alcohol, other medicines, foods, dyes, or preservatives  pregnant or are trying to become pregnant  breast-feeding How should I use this medicine? This medicine is for infusion into a vein. It is given by a health  care professional in a hospital or clinic setting. Talk to your pediatrician regarding the use of this medicine in children. While this drug may be prescribed for children as young as 31 years old for selected conditions, precautions do apply. Overdosage: If you think you have taken too much of this medicine contact a poison control center or emergency room at once. NOTE: This medicine is only for you. Do not share this medicine with others. What if I miss a dose? It is important not to miss your dose. Call your doctor or health care professional if you are unable to keep an appointment. What may interact with this medicine? Do not take this medicine with any of the following medications:  deferoxamine  dimercaprol  other iron products This medicine may also interact with the following medications:  chloramphenicol  deferasirox  medicine for blood pressure like enalapril This list may not describe all possible interactions. Give your health care provider a list of all the medicines, herbs, non-prescription drugs, or dietary supplements you use. Also tell them if you smoke, drink alcohol, or use illegal drugs. Some items may interact with your medicine. What should I watch for while using this medicine? Your condition will be monitored carefully while you are receiving this medicine. Visit your doctor for check-ups as directed. What side effects may I notice from receiving this medicine? Side effects that you should report to your doctor or  health care professional as soon as possible:  allergic reactions like skin rash, itching or hives, swelling of the face, lips, or tongue  breathing problems  changes in hearing  changes in vision  chills, flushing, or sweating  fast, irregular heartbeat  feeling faint or lightheaded, falls  fever, flu-like symptoms  high or low blood pressure  pain, tingling, numbness in the hands or feet  severe pain in the chest, back, flanks, or  groin  swelling of the ankles, feet, hands  trouble passing urine or change in the amount of urine  unusually weak or tired Side effects that usually do not require medical attention (report to your doctor or health care professional if they continue or are bothersome):  cramps  dark colored stools  diarrhea  headache  nausea, vomiting  stomach upset This list may not describe all possible side effects. Call your doctor for medical advice about side effects. You may report side effects to FDA at 1-800-FDA-1088. Where should I keep my medicine? This drug is given in a hospital or clinic and will not be stored at home. NOTE: This sheet is a summary. It may not cover all possible information. If you have questions about this medicine, talk to your doctor, pharmacist, or health care provider.  2021 Elsevier/Gold Standard (2007-12-02 15:58:57)

## 2020-07-14 NOTE — Progress Notes (Signed)
Per Dr. Burr Medico ok to treat with urine protein of 100 today

## 2020-07-17 ENCOUNTER — Telehealth: Payer: Self-pay

## 2020-07-17 NOTE — Telephone Encounter (Signed)
RTC, patient states she has pain on the left side of her neck since Saturday.  States this has happened once before last year, she went to the ED and was given a RX for pain medication.  She is asking for medication again. Pt informed she will need an office visit for evaluation.  Appt offered for tomorrow morning, pt declines, Appt made for 07/19/20 @ 3:15 2/ Dr. Marianna Payment. SChaplin, RN,BSN

## 2020-07-17 NOTE — Telephone Encounter (Signed)
Pt is requesting a call back she stated she will like to know if her dr can call her in something for neck pain that started  Since Saturday 07/15/20

## 2020-07-17 NOTE — Telephone Encounter (Signed)
Great.  Thanks

## 2020-07-18 NOTE — Telephone Encounter (Signed)
Patient called asking about her PCS / informed patient that she need to call Danae Chen @ [phone] [facility] concerning her being denied.

## 2020-07-19 ENCOUNTER — Telehealth: Payer: Self-pay

## 2020-07-19 ENCOUNTER — Encounter: Payer: Medicaid Other | Admitting: Internal Medicine

## 2020-07-19 ENCOUNTER — Other Ambulatory Visit: Payer: Self-pay

## 2020-07-19 ENCOUNTER — Encounter: Payer: Self-pay | Admitting: Internal Medicine

## 2020-07-19 ENCOUNTER — Ambulatory Visit: Payer: Medicaid Other | Admitting: Behavioral Health

## 2020-07-19 ENCOUNTER — Ambulatory Visit: Payer: Medicaid Other | Admitting: Internal Medicine

## 2020-07-19 VITALS — BP 145/82 | HR 78 | Temp 98.4°F | Ht 66.0 in | Wt 254.9 lb

## 2020-07-19 DIAGNOSIS — F331 Major depressive disorder, recurrent, moderate: Secondary | ICD-10-CM | POA: Diagnosis not present

## 2020-07-19 DIAGNOSIS — M62838 Other muscle spasm: Secondary | ICD-10-CM | POA: Diagnosis not present

## 2020-07-19 DIAGNOSIS — F32A Depression, unspecified: Secondary | ICD-10-CM

## 2020-07-19 DIAGNOSIS — Z7189 Other specified counseling: Secondary | ICD-10-CM

## 2020-07-19 MED ORDER — CYCLOBENZAPRINE HCL 5 MG PO TABS: 5.0000 mg | ORAL_TABLET | Freq: Three times a day (TID) | ORAL | 0 refills | Status: AC | PRN

## 2020-07-19 NOTE — Patient Instructions (Addendum)
Thank you, Peggy House for allowing [place] to provide your care today. Today we discussed muscle spasm.    I have ordered the following labs for you:  Lab Orders  No laboratory test(s) ordered today     Tests ordered today:  none  Referrals ordered today:   Referral Orders  No referral(s) requested today     Medication Changes:   There are no discontinued medications.   Meds ordered this encounter  Medications  . cyclobenzaprine (FLEXERIL) 5 MG tablet    Sig: Take 1 tablet (5 mg total) by mouth 3 (three) times daily as needed for up to 5 days for muscle spasms.    Dispense:  15 tablet    Refill:  0     Instructions: Please take cyclobenzaprine 5 mg as needed up to 3 time daily for neck spasm. Please do no operate heavy machinery while using this medication. Please use heating pads, gentle stretching and massage to improve your pain and mobility.  Follow up: as needed   Remember:    Should you have any questions or concerns please call the internal medicine clinic at [phone].     Marianna Payment, D.O. [place]    Muscle Cramps and Spasms Muscle cramps and spasms occur when a muscle or muscles tighten and you have no control over this tightening (involuntary muscle contraction). They are a common problem and can develop in any muscle. The most common place is in the calf muscles of the leg. Muscle cramps and muscle spasms are both involuntary muscle contractions, but there are some differences between the two:  Muscle cramps are painful. They come and go and may last for a few seconds or up to 15 minutes. Muscle cramps are often more forceful and last longer than muscle spasms.  Muscle spasms may or may not be painful. They may also last just a few seconds or much longer. Certain medical conditions, such as diabetes or Parkinson's disease, can make it more likely to develop cramps or spasms. However, cramps or spasms are usually  not caused by a serious underlying problem. Common causes include:  Doing more physical work or exercise than your body is ready for (overexertion).  Overuse from repeating certain movements too many times.  Remaining in a certain position for a long period of time.  Improper preparation, form, or technique while playing a sport or doing an activity.  Dehydration.  Injury.  Side effects of some medicines.  Abnormally low levels of the salts and minerals in your blood (electrolytes), especially potassium and calcium. This could happen if you are taking water pills (diuretics) or if you are pregnant. In many cases, the cause of muscle cramps or spasms is not known. Follow these instructions at home: Managing pain and stiffness  Try massaging, stretching, and relaxing the affected muscle. Do this for several minutes at a time.  If directed, apply heat to tight or tense muscles as often as told by your health care provider. Use the heat source that your health care provider recommends, such as a moist heat pack or a heating pad. ? Place a towel between your skin and the heat source. ? Leave the heat on for 20-30 minutes. ? Remove the heat if your skin turns bright red. This is especially important if you are unable to feel pain, heat, or cold. You may have a greater risk of getting burned.  If directed, put ice on the affected area. This may  help if you are sore or have pain after a cramp or spasm. ? Put ice in a plastic bag. ? Place a towel between your skin and the bag. ? Leavethe ice on for 20 minutes, 2-3 times a day.  Try taking hot showers or baths to help relax tight muscles.      Eating and drinking  Drink enough fluid to keep your urine pale yellow. Staying well hydrated may help prevent cramps or spasms.  Eat a healthy diet that includes plenty of nutrients to help your muscles function. A healthy diet includes fruits and vegetables, lean protein, whole grains, and  low-fat or nonfat dairy products. General instructions  If you are having frequent cramps, avoid intense exercise for several days.  Take over-the-counter and prescription medicines only as told by your health care provider.  Pay attention to any changes in your symptoms.  Keep all follow-up visits as told by your health care provider. This is important. Contact a health care provider if:  Your cramps or spasms get more severe or happen more often.  Your cramps or spasms do not improve over time. Summary  Muscle cramps and spasms occur when a muscle or muscles tighten and you have no control over this tightening (involuntary muscle contraction).  The most common place for cramps or spasms to occur is in the calf muscles of the leg.  Massaging, stretching, and relaxing the affected muscle may relieve the cramp or spasm.  Drink enough fluid to keep your urine pale yellow. Staying well hydrated may help prevent cramps or spasms. This information is not intended to replace advice given to you by your health care provider. Make sure you discuss any questions you have with your health care provider. Document Revised: 08/25/2017 Document Reviewed: 08/25/2017 Elsevier Patient Education  [place].

## 2020-07-19 NOTE — Telephone Encounter (Signed)
Return pt's call - states she needs something for her neck and she's coming in. Pt has an appt today @ 1515PM so she's on her way here.

## 2020-07-19 NOTE — Telephone Encounter (Signed)
Returned call to patient. States she is on her way. Wants something to relieve the pain she is having in her neck.

## 2020-07-19 NOTE — Telephone Encounter (Signed)
PT IS REQUESTING A CALL BACK

## 2020-07-19 NOTE — BH Specialist Note (Signed)
Integrated Behavioral Health via Telemedicine Visit  07/19/2020 Peggy House [id number]  Number of [place] visits: 2/6 Session Start time: 1:00pm  Session End time: 1:30pm Total time: 30  Referring Provider: Dr. Molli Hazard, DO Patient/Family location: Pt at home in private [facility] Provider location: [facility] Office All persons participating in visit: Pt & Clinician Types of Service: Individual psychotherapy  I connected with Peggy House and/or Peggy House's self via  Telephone or Video Enabled Telemedicine Application  (Video is Caregility application) and verified that I am speaking with the correct person using two identifiers. Discussed confidentiality: Yes   I discussed the limitations of telemedicine and the availability of in person appointments.  Discussed there is a possibility of technology failure and discussed alternative modes of communication if that failure occurs.  I discussed that engaging in this telemedicine visit, they consent to the provision of behavioral healthcare and the services will be billed under their insurance.  Patient and/or legal guardian expressed understanding and consented to Telemedicine visit: Yes   Presenting Concerns: Patient and/or family reports the following symptoms/concerns: elevated anx/dep due to recent death of [place] & resulting Court Case Duration of problem: for months; Severity of problem: moderate  Patient and/or Family's Strengths/Protective Factors: Social connections, Social and Emotional competence and Pt resilience & strong spiritual faith  Goals Addressed: Patient will: 1.  Reduce symptoms of: depression  2.  Increase knowledge and/or ability of: coping skills and stress reduction  3.  Demonstrate ability to: Increase healthy adjustment to current life circumstances  Progress towards Goals: Ongoing  Interventions: Interventions utilized:  Medication Monitoring and  Supportive Counseling Standardized Assessments completed: Not Needed  Patient and/or Family Response: Pt is receptive to visit today & requests future appt  Assessment: Patient currently experiencing elevated dep'd mood.   Patient may benefit from inc'd attn to stress reduction.  Plan: 1. Follow up with behavioral health clinician on : 2-3 wks for 30 min session 2. Behavioral recommendations: Cont to rely on your faith & support system 3. Referral(s): [facility] (In Clinic)  I discussed the assessment and treatment plan with the patient and/or parent/guardian. They were provided an opportunity to ask questions and all were answered. They agreed with the plan and demonstrated an understanding of the instructions.   They were advised to call back or seek an in-person evaluation if the symptoms worsen or if the condition fails to improve as anticipated.  [name], LMFT

## 2020-07-19 NOTE — Progress Notes (Signed)
CC: Neck pain  HPI:  Peggy House is a 60 y.o. female with a past medical history stated below and presents today for neck pain. Please see problem based assessment and plan for additional details.  Past Medical History:  Diagnosis Date  . Anxiety   . Arthritis    knnes,back  . GERD (gastroesophageal reflux disease)   . 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 History of swelling of feet   . Hyperlipidemia   . Hypertension   . Major depressive disorder, recurrent episode ([place]) 06/05/2015  . Obesity   . Snores   . 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 Accu-Chek Softclix Lancets lancets Use to check blood sugar before breakfast and before dinner while on steroids 100 each 1  . acitretin (SORIATANE) 10 MG capsule Take 10 mg by mouth daily.    [name] albuterol (VENTOLIN HFA) 108 (90 Base) MCG/ACT inhaler Inhale 1-2 puffs into the lungs every 6 (six) hours as needed for wheezing or shortness of breath. 1 each 1  . ALPRAZolam (XANAX) 0.5 MG tablet Take 1 tablet (0.5 mg total) by mouth at bedtime as needed for anxiety. 30 tablet 0  . Aminocaproic Acid 1000 MG TABS Take 1 tablet (1,000 mg total) by mouth in the morning and at bedtime. TAKE 1 TABLET(1000 MG) BY MOUTH TWICE DAILY 60 tablet 3  . amLODipine (NORVASC) 10 MG tablet TAKE 1 TABLET(10 MG) BY MOUTH DAILY 90 tablet 0  . atorvastatin (LIPITOR) 80 MG tablet TAKE 1 TABLET(80 MG) BY MOUTH DAILY (Patient taking differently: Take 80 mg by mouth daily. TAKE 1 TABLET(80 MG) BY MOUTH DAILY) 30 tablet 3  . cetirizine (ZYRTEC) 10 MG tablet TAKE 1 TABLET(10 MG) BY MOUTH DAILY 90 tablet 1  . diphenoxylate-atropine (LOMOTIL) 2.5-0.025 MG tablet 1 to 2 PO QID prn diarrhea (Patient taking differently: Take 1-2 tablets by mouth 4 (four) times daily as needed for diarrhea or loose stools.) 30 tablet 1  . glucose blood  (ACCU-CHEK GUIDE) test strip Check blood sugar 2 times per day while on steroids before breakfast and dinner 50 each 3  . hydrochlorothiazide (HYDRODIURIL) 12.5 MG tablet TAKE 1 TABLET(12.5 MG) BY MOUTH DAILY 90 tablet 1  . lidocaine-prilocaine (EMLA) cream Apply 1 application topically as needed. 30 g 0  . metFORMIN (GLUCOPHAGE) 500 MG tablet Take 1 tablet (500 mg total) by mouth 2 (two) times daily with a meal. 180 tablet 3  . pantoprazole (PROTONIX) 40 MG tablet TAKE 1 TABLET(40 MG) BY MOUTH TWICE DAILY 60 tablet 5  . Podiatric Products (FLEXITOL HEEL BALM) OINT Apply 1 application topically as needed (dry skin).     . potassium chloride (KLOR-CON) 20 MEQ packet Take 20 mEq by mouth daily. 30 packet 1  . prochlorperazine (COMPAZINE) 10 MG tablet Take 1 tablet (10 mg total) by mouth every 6 (six) hours as needed for nausea or vomiting. 30 tablet 1  . sertraline (ZOLOFT) 100 MG tablet TAKE 1 TABLET(100 MG) BY MOUTH DAILY (Patient taking differently: Take 100 mg by mouth daily.) 90 tablet 1  . terbinafine (LAMISIL AT) 1 % cream Apply 1 application topically 2 (two) times daily. Both bottom and top of both feet and toes 36 g 0  . traZODone (DESYREL) 100 MG tablet Take 1 tablet (100 mg total) by mouth at bedtime. 30 tablet 2   No current facility-administered  medications on file prior to visit.    Family History  Problem Relation Age of Onset  . Cancer Mother        Ovarian  . Ovarian cancer Mother   . Diabetes Mother   . Kidney disease Mother   . Bleeding Disorder Mother   . Diabetes type II Sister   . Bleeding Disorder Sister   . Diabetes Sister   . Colon cancer Maternal Grandfather   . Crohn's disease Maternal Grandfather   . Stomach cancer Maternal Grandmother   . Kidney disease Son   . Bleeding Disorder Son   . Dysmenorrhea Neg Hx     Social History   Socioeconomic History  . Marital status: Single    Spouse name: Not on file  . Number of children: Not on file  . Years of  education: Not on file  . Highest education level: Not on file  Occupational History  . Not on file  Tobacco Use  . Smoking status: Current Every Day Smoker    Packs/day: 0.50    Years: 30.00    Pack years: 15.00    Types: Cigarettes  . Smokeless tobacco: Former Systems developer    Types: Snuff    Quit date: 38  . Tobacco comment: 1/2 PPD  Vaping Use  . Vaping Use: Never used  Substance and Sexual Activity  . Alcohol use: No    Alcohol/week: 0.0 standard drinks  . Drug use: No    Comment: last cocaine-2010  . Sexual activity: Yes    Birth control/protection: Surgical    Comment: Hysterectomy  Other Topics Concern  . Not on file  Social History Narrative  . Not on file   Social Determinants of Health   Financial Resource Strain: Not on file  Food Insecurity: Not on file  Transportation Needs: Not on file  Physical Activity: Not on file  Stress: Not on file  Social Connections: Not on file  Intimate Partner Violence: Not on file    Review of Systems: ROS negative except for what is noted on the assessment and plan.  Vitals:   07/19/20 1520  BP: (!) 145/82  Pulse: 78  Temp: 98.4 F (36.9 C)  TempSrc: Oral  SpO2: 100%  Weight: 254 lb 14.4 oz (115.6 kg)  Height: 5\' 6"  (1.676 m)     Physical Exam: Physical Exam Constitutional:      Appearance: Normal appearance.  HENT:     Head: Normocephalic and atraumatic.  Neck:     Comments: TTP of left neck without erythema or edema Cardiovascular:     Rate and Rhythm: Normal rate and regular rhythm.     Pulses: Normal pulses.  Musculoskeletal:     Cervical back: Normal range of motion. Tenderness present.  Skin:    General: Skin is warm and dry.  Neurological:     General: No focal deficit present.     Mental Status: She is alert and oriented to person, place, and time.      Assessment & Plan:   See Encounters Tab for problem based charting.  Patient discussed with Dr. Karie Schwalbe, D.O. [place]  Internal Medicine, PGY-2 Pager: [phone], Phone: [phone] Date 07/22/2020 Time 6:19 AM

## 2020-07-20 ENCOUNTER — Ambulatory Visit: Payer: Medicaid Other

## 2020-07-20 ENCOUNTER — Other Ambulatory Visit: Payer: Medicaid Other

## 2020-07-22 ENCOUNTER — Encounter: Payer: Self-pay | Admitting: Internal Medicine

## 2020-07-22 DIAGNOSIS — M62838 Other muscle spasm: Secondary | ICD-10-CM | POA: Insufficient documentation

## 2020-07-22 MED ORDER — SERTRALINE HCL 25 MG PO TABS: 25.0000 mg | ORAL_TABLET | Freq: Every day | ORAL | 2 refills | Status: DC

## 2020-07-22 NOTE — Assessment & Plan Note (Addendum)
Patient has a history of MDD and has been seeing Dr. Theodis Shove for counseling. After speaking with Dr. Theodis Shove, she recommends increasing her sertraline to 125 mg daily. I agree with this plan and will put in the prescription for 25 mg of sertraline to be taken with her 100 mg already prescribed to equal a total of 125 mg.

## 2020-07-22 NOTE — Assessment & Plan Note (Addendum)
Patient presents with left sided neck pain that started Saturday morning. She states that she woke up with the pain and denies any trauma or triggering events. She states that she feels like she "slept on it weird". She has tried icy-hot patches and tylenol without success. She states that she had something similar happen several years ago and it improved with OTC pain medicine and muscle relaxers. She denies any signs of symptoms of infection.    Plan: - Will prescribe a short course of flexeril  - Counseled patient regarding use of OTC pain medicine such has tylenol and ibuprofen. I recommended heat therapy and gentle stretching

## 2020-07-25 ENCOUNTER — Encounter: Payer: Medicaid Other | Admitting: Internal Medicine

## 2020-07-27 ENCOUNTER — Inpatient Hospital Stay: Payer: Medicaid Other

## 2020-07-27 ENCOUNTER — Other Ambulatory Visit: Payer: Self-pay

## 2020-07-27 VITALS — BP 129/66 | HR 76 | Temp 98.9°F | Resp 18

## 2020-07-27 DIAGNOSIS — Z95828 Presence of other vascular implants and grafts: Secondary | ICD-10-CM

## 2020-07-27 DIAGNOSIS — D500 Iron deficiency anemia secondary to blood loss (chronic): Secondary | ICD-10-CM

## 2020-07-27 DIAGNOSIS — R808 Other proteinuria: Secondary | ICD-10-CM

## 2020-07-27 DIAGNOSIS — Z5112 Encounter for antineoplastic immunotherapy: Secondary | ICD-10-CM | POA: Diagnosis not present

## 2020-07-27 DIAGNOSIS — I780 Hereditary hemorrhagic telangiectasia: Secondary | ICD-10-CM

## 2020-07-27 LAB — FERRITIN: Ferritin: 117 ng/mL (ref 11–307)

## 2020-07-27 LAB — CBC WITH DIFFERENTIAL (CANCER CENTER ONLY)
Abs Immature Granulocytes: 0.06 10*3/uL (ref 0.00–0.07)
Basophils Absolute: 0.1 10*3/uL (ref 0.0–0.1)
Basophils Relative: 0 %
Eosinophils Absolute: 0.1 10*3/uL (ref 0.0–0.5)
Eosinophils Relative: 1 %
HCT: 25.6 % — ABNORMAL LOW (ref 36.0–46.0)
Hemoglobin: 7.5 g/dL — ABNORMAL LOW (ref 12.0–15.0)
Immature Granulocytes: 1 %
Lymphocytes Relative: 12 %
Lymphs Abs: 1.5 10*3/uL (ref 0.7–4.0)
MCH: 26.4 pg (ref 26.0–34.0)
MCHC: 29.3 g/dL — ABNORMAL LOW (ref 30.0–36.0)
MCV: 90.1 fL (ref 80.0–100.0)
Monocytes Absolute: 0.5 10*3/uL (ref 0.1–1.0)
Monocytes Relative: 4 %
Neutro Abs: 10.1 10*3/uL — ABNORMAL HIGH (ref 1.7–7.7)
Neutrophils Relative %: 82 %
Platelet Count: 326 10*3/uL (ref 150–400)
RBC: 2.84 MIL/uL — ABNORMAL LOW (ref 3.87–5.11)
RDW: 20.7 % — ABNORMAL HIGH (ref 11.5–15.5)
WBC Count: 12.3 10*3/uL — ABNORMAL HIGH (ref 4.0–10.5)
nRBC: 0 % (ref 0.0–0.2)

## 2020-07-27 LAB — SAMPLE TO BLOOD BANK

## 2020-07-27 LAB — TOTAL PROTEIN, URINE DIPSTICK: Protein, ur: 100 mg/dL — AB

## 2020-07-27 MED ORDER — SODIUM CHLORIDE 0.9% FLUSH
10.0000 mL | INTRAVENOUS | Status: DC | PRN
  Administered 2020-07-27: 10 mL
  Filled 2020-07-27: qty 10

## 2020-07-27 MED ORDER — SODIUM CHLORIDE 0.9 % IV SOLN
INTRAVENOUS | Status: DC
  Filled 2020-07-27: qty 250

## 2020-07-27 MED ORDER — HEPARIN SOD (PORK) LOCK FLUSH 100 UNIT/ML IV SOLN
500.0000 [IU] | Freq: Once | INTRAVENOUS | Status: AC | PRN
  Administered 2020-07-27: 500 [IU]
  Filled 2020-07-27: qty 5

## 2020-07-27 MED ORDER — SODIUM CHLORIDE 0.9 % IV SOLN
Freq: Once | INTRAVENOUS | Status: AC
  Filled 2020-07-27: qty 250

## 2020-07-27 MED ORDER — SODIUM CHLORIDE 0.9 % IV SOLN
5.0000 mg/kg | Freq: Once | INTRAVENOUS | Status: AC
  Administered 2020-07-27: 600 mg via INTRAVENOUS
  Filled 2020-07-27: qty 8

## 2020-07-27 MED ORDER — SODIUM CHLORIDE 0.9 % IV SOLN
250.0000 mg | Freq: Once | INTRAVENOUS | Status: AC
  Administered 2020-07-27: 250 mg via INTRAVENOUS
  Filled 2020-07-27: qty 20

## 2020-07-27 NOTE — Patient Instructions (Signed)
[facility] Discharge Instructions for Patients Receiving Chemotherapy  Today you received the following chemotherapy agents Bevacizumab(Avastin)  To help prevent nausea and vomiting after your treatment, we encourage you to take your nausea medication as directed.   If you develop nausea and vomiting that is not controlled by your nausea medication, call the clinic.   BELOW ARE SYMPTOMS THAT SHOULD BE REPORTED IMMEDIATELY:  *FEVER GREATER THAN 100.5 F  *CHILLS WITH OR WITHOUT FEVER  NAUSEA AND VOMITING THAT IS NOT CONTROLLED WITH YOUR NAUSEA MEDICATION  *UNUSUAL SHORTNESS OF BREATH  *UNUSUAL BRUISING OR BLEEDING  TENDERNESS IN MOUTH AND THROAT WITH OR WITHOUT PRESENCE OF ULCERS  *URINARY PROBLEMS  *BOWEL PROBLEMS  UNUSUAL RASH Items with * indicate a potential emergency and should be followed up as soon as possible.  Feel free to call the clinic should you have any questions or concerns. The clinic phone number is [phone].  Please show the [facility] at check-in to the Emergency Department and triage nurse.  Iron Sucrose injection What is this medicine? IRON SUCROSE (AHY ern SOO krohs) is an iron complex. Iron is used to make healthy red blood cells, which carry oxygen and nutrients throughout the body. This medicine is used to treat iron deficiency anemia in people with chronic kidney disease. This medicine may be used for other purposes; ask your health care provider or pharmacist if you have questions. COMMON BRAND NAME(S): Venofer What should I tell my health care provider before I take this medicine? They need to know if you have any of these conditions: anemia not caused by low iron levels heart disease high levels of iron in the blood kidney disease liver disease an unusual or allergic reaction to iron, other medicines, foods, dyes, or preservatives pregnant or trying to get pregnant breast-feeding How should I use this  medicine? This medicine is for infusion into a vein. It is given by a health care professional in a hospital or clinic setting. Talk to your pediatrician regarding the use of this medicine in children. While this drug may be prescribed for children as young as 2 years for selected conditions, precautions do apply. Overdosage: If you think you have taken too much of this medicine contact a poison control center or emergency room at once. NOTE: This medicine is only for you. Do not share this medicine with others. What if I miss a dose? It is important not to miss your dose. Call your doctor or health care professional if you are unable to keep an appointment. What may interact with this medicine? Do not take this medicine with any of the following medications: deferoxamine dimercaprol other iron products This medicine may also interact with the following medications: chloramphenicol deferasirox This list may not describe all possible interactions. Give your health care provider a list of all the medicines, herbs, non-prescription drugs, or dietary supplements you use. Also tell them if you smoke, drink alcohol, or use illegal drugs. Some items may interact with your medicine. What should I watch for while using this medicine? Visit your doctor or healthcare professional regularly. Tell your doctor or healthcare professional if your symptoms do not start to get better or if they get worse. You may need blood work done while you are taking this medicine. You may need to follow a special diet. Talk to your doctor. Foods that contain iron include: whole grains/cereals, dried fruits, beans, or peas, leafy green vegetables, and organ meats (liver, kidney). What side effects  may I notice from receiving this medicine? Side effects that you should report to your doctor or health care professional as soon as possible: allergic reactions like skin rash, itching or hives, swelling of the face, lips, or  tongue breathing problems changes in blood pressure cough fast, irregular heartbeat feeling faint or lightheaded, falls fever or chills flushing, sweating, or hot feelings joint or muscle aches/pains seizures swelling of the ankles or feet unusually weak or tired Side effects that usually do not require medical attention (report to your doctor or health care professional if they continue or are bothersome): diarrhea feeling achy headache irritation at site where injected nausea, vomiting stomach upset tiredness This list may not describe all possible side effects. Call your doctor for medical advice about side effects. You may report side effects to FDA at 1-800-FDA-1088. Where should I keep my medicine? This drug is given in a hospital or clinic and will not be stored at home. NOTE: This sheet is a summary. It may not cover all possible information. If you have questions about this medicine, talk to your doctor, pharmacist, or health care provider.  2021 Elsevier/Gold Standard (2011-01-10 17:14  Sodium Ferric Gluconate Complex injection What is this medicine? SODIUM FERRIC GLUCONATE COMPLEX (SOE dee um FER ik GLOO koe nate KOM pleks) is an iron replacement. It is used with epoetin therapy to treat low iron levels in patients who are receiving hemodialysis. This medicine may be used for other purposes; ask your health care provider or pharmacist if you have questions. COMMON BRAND NAME(S): Ferrlecit, Nulecit What should I tell my health care provider before I take this medicine? They need to know if you have any of the following conditions:  anemia that is not from iron deficiency  high levels of iron in the body  an unusual or allergic reaction to iron, benzyl alcohol, other medicines, foods, dyes, or preservatives  pregnant or are trying to become pregnant  breast-feeding How should I use this medicine? This medicine is for infusion into a vein. It is given by a health  care professional in a hospital or clinic setting. Talk to your pediatrician regarding the use of this medicine in children. While this drug may be prescribed for children as young as 68 years old for selected conditions, precautions do apply. Overdosage: If you think you have taken too much of this medicine contact a poison control center or emergency room at once. NOTE: This medicine is only for you. Do not share this medicine with others. What if I miss a dose? It is important not to miss your dose. Call your doctor or health care professional if you are unable to keep an appointment. What may interact with this medicine? Do not take this medicine with any of the following medications:  deferoxamine  dimercaprol  other iron products This medicine may also interact with the following medications:  chloramphenicol  deferasirox  medicine for blood pressure like enalapril This list may not describe all possible interactions. Give your health care provider a list of all the medicines, herbs, non-prescription drugs, or dietary supplements you use. Also tell them if you smoke, drink alcohol, or use illegal drugs. Some items may interact with your medicine. What should I watch for while using this medicine? Your condition will be monitored carefully while you are receiving this medicine. Visit your doctor for check-ups as directed. What side effects may I notice from receiving this medicine? Side effects that you should report to your doctor or  health care professional as soon as possible:  allergic reactions like skin rash, itching or hives, swelling of the face, lips, or tongue  breathing problems  changes in hearing  changes in vision  chills, flushing, or sweating  fast, irregular heartbeat  feeling faint or lightheaded, falls  fever, flu-like symptoms  high or low blood pressure  pain, tingling, numbness in the hands or feet  severe pain in the chest, back, flanks, or  groin  swelling of the ankles, feet, hands  trouble passing urine or change in the amount of urine  unusually weak or tired Side effects that usually do not require medical attention (report to your doctor or health care professional if they continue or are bothersome):  cramps  dark colored stools  diarrhea  headache  nausea, vomiting  stomach upset This list may not describe all possible side effects. Call your doctor for medical advice about side effects. You may report side effects to FDA at 1-800-FDA-1088. Where should I keep my medicine? This drug is given in a hospital or clinic and will not be stored at home. NOTE: This sheet is a summary. It may not cover all possible information. If you have questions about this medicine, talk to your doctor, pharmacist, or health care provider.  2021 Elsevier/Gold Standard (2007-12-02 15:58:57)

## 2020-07-27 NOTE — Patient Instructions (Signed)

## 2020-07-27 NOTE — Progress Notes (Signed)
Internal Medicine Clinic Attending  Case discussed with Dr. Coe  At the time of the visit.  We reviewed the resident's history and exam and pertinent patient test results.  I agree with the assessment, diagnosis, and plan of care documented in the resident's note.  

## 2020-07-28 ENCOUNTER — Ambulatory Visit: Payer: Medicaid Other

## 2020-07-28 ENCOUNTER — Other Ambulatory Visit: Payer: Medicaid Other

## 2020-07-28 LAB — PREPARE RBC (CROSSMATCH)

## 2020-07-29 ENCOUNTER — Inpatient Hospital Stay: Payer: Medicaid Other

## 2020-07-31 ENCOUNTER — Telehealth: Payer: Self-pay

## 2020-07-31 LAB — BPAM RBC
Blood Product Expiration Date: 202205092359
Unit Type and Rh: 600

## 2020-07-31 LAB — TYPE AND SCREEN
ABO/RH(D): A NEG
Antibody Screen: NEGATIVE
Unit division: 0

## 2020-07-31 NOTE — Telephone Encounter (Signed)
Called message left to inquire about missed appt on [phone] also asked if pt agreeable to reschedule on 4/21 awaiting call back for notification and response

## 2020-07-31 NOTE — Telephone Encounter (Signed)
-----   Message from Alla Feeling, NP sent at 07/30/2020  9:11 PM EDT ----- It appears she did not show for blood transfusion on 4/16. Please call to see how she's doing, any signs of bleeding? Please check with Threasa Beards if we can add blood transfusion when she comes in 4/21. She has iron scheduled but likely will need blood for hg 7.5 last week.   Thanks, Regan Rakers, NP

## 2020-08-03 ENCOUNTER — Other Ambulatory Visit: Payer: Self-pay

## 2020-08-03 ENCOUNTER — Inpatient Hospital Stay: Payer: Medicaid Other

## 2020-08-03 ENCOUNTER — Inpatient Hospital Stay (HOSPITAL_BASED_OUTPATIENT_CLINIC_OR_DEPARTMENT_OTHER): Payer: Medicaid Other | Admitting: Hematology

## 2020-08-03 ENCOUNTER — Other Ambulatory Visit: Payer: Medicaid Other

## 2020-08-03 ENCOUNTER — Encounter: Payer: Self-pay | Admitting: Hematology

## 2020-08-03 VITALS — BP 149/81 | HR 71 | Temp 97.8°F | Resp 71 | Ht 66.0 in | Wt 251.5 lb

## 2020-08-03 DIAGNOSIS — D500 Iron deficiency anemia secondary to blood loss (chronic): Secondary | ICD-10-CM | POA: Diagnosis not present

## 2020-08-03 DIAGNOSIS — I780 Hereditary hemorrhagic telangiectasia: Secondary | ICD-10-CM

## 2020-08-03 DIAGNOSIS — Z5112 Encounter for antineoplastic immunotherapy: Secondary | ICD-10-CM | POA: Diagnosis not present

## 2020-08-03 DIAGNOSIS — Z95828 Presence of other vascular implants and grafts: Secondary | ICD-10-CM

## 2020-08-03 LAB — CBC WITH DIFFERENTIAL (CANCER CENTER ONLY)
Abs Immature Granulocytes: 0.07 10*3/uL (ref 0.00–0.07)
Basophils Absolute: 0.1 10*3/uL (ref 0.0–0.1)
Basophils Relative: 0 %
Eosinophils Absolute: 0.1 10*3/uL (ref 0.0–0.5)
Eosinophils Relative: 1 %
HCT: 26.5 % — ABNORMAL LOW (ref 36.0–46.0)
Hemoglobin: 7.5 g/dL — ABNORMAL LOW (ref 12.0–15.0)
Immature Granulocytes: 1 %
Lymphocytes Relative: 11 %
Lymphs Abs: 1.4 10*3/uL (ref 0.7–4.0)
MCH: 25.5 pg — ABNORMAL LOW (ref 26.0–34.0)
MCHC: 28.3 g/dL — ABNORMAL LOW (ref 30.0–36.0)
MCV: 90.1 fL (ref 80.0–100.0)
Monocytes Absolute: 0.6 10*3/uL (ref 0.1–1.0)
Monocytes Relative: 5 %
Neutro Abs: 10.5 10*3/uL — ABNORMAL HIGH (ref 1.7–7.7)
Neutrophils Relative %: 82 %
Platelet Count: 262 10*3/uL (ref 150–400)
RBC: 2.94 MIL/uL — ABNORMAL LOW (ref 3.87–5.11)
RDW: 21.6 % — ABNORMAL HIGH (ref 11.5–15.5)
WBC Count: 12.8 10*3/uL — ABNORMAL HIGH (ref 4.0–10.5)
nRBC: 0 % (ref 0.0–0.2)

## 2020-08-03 LAB — SAMPLE TO BLOOD BANK

## 2020-08-03 LAB — PREPARE RBC (CROSSMATCH)

## 2020-08-03 MED ORDER — HEPARIN SOD (PORK) LOCK FLUSH 100 UNIT/ML IV SOLN
500.0000 [IU] | Freq: Once | INTRAVENOUS | Status: AC
  Administered 2020-08-03: 500 [IU]
  Filled 2020-08-03: qty 5

## 2020-08-03 MED ORDER — SODIUM CHLORIDE 0.9 % IV SOLN
250.0000 mg | Freq: Once | INTRAVENOUS | Status: AC
  Administered 2020-08-03: 250 mg via INTRAVENOUS
  Filled 2020-08-03: qty 20

## 2020-08-03 MED ORDER — SODIUM CHLORIDE 0.9% FLUSH
10.0000 mL | Freq: Once | INTRAVENOUS | Status: AC
  Administered 2020-08-03: 10 mL
  Filled 2020-08-03: qty 10

## 2020-08-03 MED ORDER — SODIUM CHLORIDE 0.9 % IV SOLN
INTRAVENOUS | Status: DC
  Filled 2020-08-03: qty 250

## 2020-08-03 NOTE — Progress Notes (Signed)
[place]   [phone] [phone]   Clinic Follow up Note   Patient Care Team: [name], MD as PCP - General (Internal Medicine)  Date of Service:  08/03/2020  CHIEF COMPLAINT: f/u of HHT andAnemia  PREVIOUS THERAPY:  -Multiple EGD, small bowel enteroscope, C-scope with APC -Oral and iv amicar were given during hospital stay, no effect -IR embolization ofof the left gastric and short gastric artery branch vessels without evidence of residual flow in the Dieulafoy lesionon 12/10/17 -Avastin on 8/2 and 8/30 at [facility]; starting 12/26/17 continue 5mg /kg q2 weeksuntil 02/20/18, restarted on 04/03/2018 -Tamoxifen started in 10/2017 for HHTstopped 12/10/18 due toDVT  CURRENT THERAPY:  -Blood transfusion for severe anemia secondary to GI bleedingas needed -ivferric gluconate every1-2 weeks as neededwith ferritin goal 100-200. Increased to weekly on 07/29/19.Reduced to every 2 weeks on 11/12/19 -RestartedAvastin/Zirabevq2weeks on 04/03/18. Reduced to every 4 weeks starting 09/25/19. Increased to every 2 weeks on 01/06/20. Increased dose to 250mg  on 01/20/20 -Amicar 1g bidstarting4/06/2018. Reduced to only as needed on 12/10/18 due toDVT. Restart daily on 06/16/20 due to increased bleeding.Increased to BID, then TID on 07/07/20.   INTERVAL HISTORY:  Peggy House is here for a follow up of HHT andAnemia. She was last seen by me on 07/07/20. She presents to the clinic alone. She notes her son brings her to her appointments. She reports fatigue. She notes she has to catch her breath after getting up to do anything. She reports dark brown stool this past Friday (4/15) and Monday (4/18). She notes she did not make it to her bevacizumab on 07/19/20 because she overslept.  REVIEW OF SYSTEMS:   Constitutional: Denies fevers, chills or abnormal weight loss, (+) fatigue Eyes: Denies blurriness of vision Ears, nose, mouth, throat, and face:  Denies mucositis or sore throat Respiratory: Denies cough, dyspnea or wheezes Cardiovascular: Denies palpitation, chest discomfort or lower extremity swelling Gastrointestinal:  Denies nausea, heartburn or change in bowel habits Skin: Denies abnormal skin rashes Lymphatics: Denies new lymphadenopathy or easy bruising Neurological:Denies numbness, tingling or new weaknesses Behavioral/Psych: Mood is stable, no new changes  All other systems were reviewed with the patient and are negative.  MEDICAL HISTORY:  Past Medical History:  Diagnosis Date  . Anxiety   . Arthritis    knnes,back  . GERD (gastroesophageal reflux disease)   . 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 History of swelling of feet   . Hyperlipidemia   . Hypertension   . Major depressive disorder, recurrent episode ([place]) 06/05/2015  . Obesity   . Snores   . 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 ABDOMINAL HYSTERECTOMY    . CARPAL TUNNEL RELEASE  05/13/2011   Procedure: CARPAL TUNNEL RELEASE;  Surgeon: Nita Sells, MD;  Location: [place];  Service: Orthopedics;  Laterality: Left;  . COLONOSCOPY N/A 03/02/2020   Procedure: COLONOSCOPY;  Surgeon: Ronald Lobo, MD;  Location: [facility] ENDOSCOPY;  Service: Endoscopy;  Laterality: N/A;  . COLONOSCOPY WITH PROPOFOL N/A 04/28/2014   Procedure: COLONOSCOPY WITH PROPOFOL;  Surgeon: Cleotis Nipper, MD;  Location: [facility] ENDOSCOPY;  Service: Endoscopy;  Laterality: N/A;  . DG TOES*L*  2/10   rt  . DILATION AND CURETTAGE OF UTERUS    . ENTEROSCOPY N/A 10/17/2017   Procedure: ENTEROSCOPY;  Surgeon: Otis Brace, MD;  Location: [place] ENDOSCOPY;  Service: Gastroenterology;  Laterality: N/A;  .  ESOPHAGOGASTRODUODENOSCOPY N/A 04/10/2014   Procedure: ESOPHAGOGASTRODUODENOSCOPY (EGD);  Surgeon: [name], MD;  Location: [facility] ENDOSCOPY;  Service:  Endoscopy;  Laterality: N/A;  . ESOPHAGOGASTRODUODENOSCOPY N/A 05/10/2017   Procedure: ESOPHAGOGASTRODUODENOSCOPY (EGD);  Surgeon: Ronald Lobo, MD;  Location: [facility] ENDOSCOPY;  Service: Endoscopy;  Laterality: N/A;  . ESOPHAGOGASTRODUODENOSCOPY N/A 09/22/2017   Procedure: ESOPHAGOGASTRODUODENOSCOPY (EGD);  Surgeon: Clarene Essex, MD;  Location: [place];  Service: Endoscopy;  Laterality: N/A;  bedside  . ESOPHAGOGASTRODUODENOSCOPY N/A 03/02/2020   Procedure: ESOPHAGOGASTRODUODENOSCOPY (EGD);  Surgeon: Ronald Lobo, MD;  Location: [facility];  Service: Endoscopy;  Laterality: N/A;  . ESOPHAGOGASTRODUODENOSCOPY (EGD) WITH PROPOFOL N/A 04/27/2014   Procedure: ESOPHAGOGASTRODUODENOSCOPY (EGD) WITH PROPOFOL;  Surgeon: Cleotis Nipper, MD;  Location: [place];  Service: Endoscopy;  Laterality: N/A;  possible apc  . ESOPHAGOGASTRODUODENOSCOPY (EGD) WITH PROPOFOL N/A 09/30/2017   Procedure: ESOPHAGOGASTRODUODENOSCOPY (EGD) WITH PROPOFOL;  Surgeon: [name], MD;  Location: [place];  Service: Gastroenterology;  Laterality: N/A;  . ESOPHAGOGASTRODUODENOSCOPY (EGD) WITH PROPOFOL N/A 10/01/2017   Procedure: ESOPHAGOGASTRODUODENOSCOPY (EGD) WITH PROPOFOL;  Surgeon: [name], MD;  Location: [place];  Service: Gastroenterology;  Laterality: N/A;  . ESOPHAGOGASTRODUODENOSCOPY (EGD) WITH PROPOFOL N/A 10/08/2017   Procedure: ESOPHAGOGASTRODUODENOSCOPY (EGD) WITH PROPOFOL;  Surgeon: Otis Brace, MD;  Location: [facility] ENDOSCOPY;  Service: Gastroenterology;  Laterality: N/A;  . ESOPHAGOGASTRODUODENOSCOPY (EGD) WITH PROPOFOL N/A 10/17/2017   Procedure: ESOPHAGOGASTRODUODENOSCOPY (EGD) WITH PROPOFOL;  Surgeon: Otis Brace, MD;  Location: [facility] ENDOSCOPY;  Service: Gastroenterology;  Laterality: N/A;  . ESOPHAGOGASTRODUODENOSCOPY (EGD) WITH PROPOFOL N/A 10/19/2017   Procedure: ESOPHAGOGASTRODUODENOSCOPY (EGD) WITH PROPOFOL;  Surgeon: Otis Brace, MD;  Location: [facility] ENDOSCOPY;  Service:  Gastroenterology;  Laterality: N/A;  . ESOPHAGOGASTRODUODENOSCOPY (EGD) WITH PROPOFOL N/A 12/04/2018   Procedure: ESOPHAGOGASTRODUODENOSCOPY (EGD) WITH PROPOFOL;  Surgeon: Wilford Corner, MD;  Location: [facility] ENDOSCOPY;  Service: Endoscopy;  Laterality: N/A;  . GIVENS CAPSULE STUDY N/A 10/02/2017   Procedure: GIVENS CAPSULE STUDY;  Surgeon: [name], MD;  Location: [place];  Service: Gastroenterology;  Laterality: N/A;  . GIVENS CAPSULE STUDY N/A 10/08/2017   Procedure: GIVENS CAPSULE STUDY;  Surgeon: Otis Brace, MD;  Location: [place];  Service: Gastroenterology;  Laterality: N/A;  endoscopic placement of capsule  . GIVENS CAPSULE STUDY N/A 03/02/2020   Procedure: GIVENS CAPSULE STUDY;  Surgeon: Ronald Lobo, MD;  Location: [facility] ENDOS[facility];  Service: Endoscopy;  Laterality: N/A;  . HEMOSTASIS CLIP PLACEMENT  12/04/2018   Procedure: HEMOSTASIS CLIP PLACEMENT;  Surgeon: Wilford Corner, MD;  Location: [facility] ENDOS[facility];  Service: Endoscopy;;  . HOT HEMOSTASIS N/A 04/27/2014   Procedure: HOT HEMOSTASIS (ARGON PLASMA COAGULATION/BICAP);  Surgeon: Cleotis Nipper, MD;  Location: [facility] ENDOSCOPY;  Service: Endoscopy;  Laterality: N/A;  . HOT HEMOSTASIS N/A 09/30/2017   Procedure: HOT HEMOSTASIS (ARGON PLASMA COAGULATION/BICAP);  Surgeon: [name], MD;  Location: [place];  Service: Gastroenterology;  Laterality: N/A;  . HOT HEMOSTASIS N/A 10/01/2017   Procedure: HOT HEMOSTASIS (ARGON PLASMA COAGULATION/BICAP);  Surgeon: [name], MD;  Location: [place];  Service: Gastroenterology;  Laterality: N/A;  . HOT HEMOSTASIS N/A 10/17/2017   Procedure: HOT HEMOSTASIS (ARGON PLASMA COAGULATION/BICAP);  Surgeon: Otis Brace, MD;  Location: [facility] ENDOSCOPY;  Service: Gastroenterology;  Laterality: N/A;  . HOT HEMOSTASIS N/A 10/19/2017   Procedure: HOT HEMOSTASIS (ARGON PLASMA COAGULATION/BICAP);  Surgeon: Otis Brace, MD;  Location: [facility] ENDOSCOPY;  Service: Gastroenterology;   Laterality: N/A;  . HOT HEMOSTASIS N/A 03/02/2020   Procedure: HOT HEMOSTASIS (ARGON PLASMA COAGULATION/BICAP);  Surgeon: Ronald Lobo, MD;  Location: [facility];  Service: Endoscopy;  Laterality: N/A;  . IR IMAGING GUIDED PORT INSERTION  07/08/2018  . L shoulder Surgery  2011  . POLYPECTOMY  03/02/2020   Procedure: POLYPECTOMY;  Surgeon: Ronald Lobo, MD;  Location: [facility] ENDOSCOPY;  Service: Endoscopy;;  . SUBMUCOSAL INJECTION  09/22/2017   Procedure: SUBMUCOSAL INJECTION;  Surgeon: Clarene Essex, MD;  Location: [place];  Service: Endoscopy;;  . SUBMUCOSAL INJECTION  12/04/2018   Procedure: SUBMUCOSAL INJECTION;  Surgeon: Wilford Corner, MD;  Location: [facility] ENDOSCOPY;  Service: Endoscopy;;    I have reviewed the social history and family history with the patient and they are unchanged from previous note.  ALLERGIES:  is allergic to feraheme [ferumoxytol], nsaids, tomato, iron (ferrous sulfate) [ferrous sulfate er], other, and wasp venom.  MEDICATIONS:  Current Outpatient Medications  Medication Sig Dispense Refill  . Accu-Chek Softclix Lancets lancets Use to check blood sugar before breakfast and before dinner while on steroids 100 each 1  . acitretin (SORIATANE) 10 MG capsule Take 10 mg by mouth daily.    [name] albuterol (VENTOLIN HFA) 108 (90 Base) MCG/ACT inhaler Inhale 1-2 puffs into the lungs every 6 (six) hours as needed for wheezing or shortness of breath. 1 each 1  . ALPRAZolam (XANAX) 0.5 MG tablet Take 1 tablet (0.5 mg total) by mouth at bedtime as needed for anxiety. 30 tablet 0  . Aminocaproic Acid 1000 MG TABS Take 1 tablet (1,000 mg total) by mouth in the morning and at bedtime. TAKE 1 TABLET(1000 MG) BY MOUTH TWICE DAILY 60 tablet 3  . amLODipine (NORVASC) 10 MG tablet TAKE 1 TABLET(10 MG) BY MOUTH DAILY 90 tablet 0  . atorvastatin (LIPITOR) 80 MG tablet TAKE 1 TABLET(80 MG) BY MOUTH DAILY (Patient taking differently: Take 80 mg by mouth daily. TAKE 1 TABLET(80 MG) BY  MOUTH DAILY) 30 tablet 3  . cetirizine (ZYRTEC) 10 MG tablet TAKE 1 TABLET(10 MG) BY MOUTH DAILY 90 tablet 1  . diphenoxylate-atropine (LOMOTIL) 2.5-0.025 MG tablet 1 to 2 PO QID prn diarrhea (Patient taking differently: Take 1-2 tablets by mouth 4 (four) times daily as needed for diarrhea or loose stools.) 30 tablet 1  . glucose blood (ACCU-CHEK GUIDE) test strip Check blood sugar 2 times per day while on steroids before breakfast and dinner 50 each 3  . hydrochlorothiazide (HYDRODIURIL) 12.5 MG tablet TAKE 1 TABLET(12.5 MG) BY MOUTH DAILY 90 tablet 1  . lidocaine-prilocaine (EMLA) cream Apply 1 application topically as needed. 30 g 0  . metFORMIN (GLUCOPHAGE) 500 MG tablet Take 1 tablet (500 mg total) by mouth 2 (two) times daily with a meal. 180 tablet 3  . pantoprazole (PROTONIX) 40 MG tablet TAKE 1 TABLET(40 MG) BY MOUTH TWICE DAILY 60 tablet 5  . Podiatric Products (FLEXITOL HEEL BALM) OINT Apply 1 application topically as needed (dry skin).     . potassium chloride (KLOR-CON) 20 MEQ packet Take 20 mEq by mouth daily. 30 packet 1  . prochlorperazine (COMPAZINE) 10 MG tablet Take 1 tablet (10 mg total) by mouth every 6 (six) hours as needed for nausea or vomiting. 30 tablet 1  . sertraline (ZOLOFT) 100 MG tablet TAKE 1 TABLET(100 MG) BY MOUTH DAILY (Patient taking differently: Take 100 mg by mouth daily.) 90 tablet 1  . sertraline (ZOLOFT) 25 MG tablet Take 1 tablet (25 mg total) by mouth daily. 30 tablet 2  . terbinafine (LAMISIL AT) 1 % cream Apply 1 application topically 2 (two) times daily. Both bottom and top of  both feet and toes 36 g 0  . traZODone (DESYREL) 100 MG tablet Take 1 tablet (100 mg total) by mouth at bedtime. 30 tablet 2   No current facility-administered medications for this visit.    PHYSICAL EXAMINATION: ECOG PERFORMANCE STATUS: 2 - Symptomatic, <50% confined to bed  Vitals:   08/03/20 1351  BP: (!) 149/81  Pulse: 71  Resp: (!) 71  Temp: 97.8 F (36.6 C)  SpO2:  100%   Filed Weights   08/03/20 1351  Weight: 251 lb 8 oz (114.1 kg)    Due to COVID19 we will limit examination to appearance. Patient had no complaints.  GENERAL:alert, no distress and comfortable SKIN: skin color normal, no rashes or significant lesions EYES: normal, Conjunctiva are pink and non-injected, sclera clear  NEURO: alert & oriented x 3 with fluent speech  LABORATORY DATA:  I have reviewed the data as listed CBC Latest Ref Rng & Units 08/03/2020 07/27/2020 07/14/2020  WBC 4.0 - 10.5 K/uL 12.8(H) 12.3(H) 11.8(H)  Hemoglobin 12.0 - 15.0 g/dL 7.5(L) 7.5(L) 9.7(L)  Hematocrit 36.0 - 46.0 % 26.5(L) 25.6(L) 33.5(L)  Platelets 150 - 400 K/uL 262 326 290     CMP Latest Ref Rng & Units 07/14/2020 06/23/2020 06/16/2020  Glucose 70 - 99 mg/dL 123(H) 119(H) 113(H)  BUN 6 - 20 mg/dL 9 12 9   Creatinine 0.44 - 1.00 mg/dL 0.80 0.92 0.78  Sodium 135 - 145 mmol/L 142 145 142  Potassium 3.5 - 5.1 mmol/L 3.8 3.4(L) 3.6  Chloride 98 - 111 mmol/L 106 109 107  CO2 22 - 32 mmol/L 24 23 24   Calcium 8.9 - 10.3 mg/dL 8.8(L) 9.1 9.0  Total Protein 6.5 - 8.1 g/dL 7.6 7.4 7.4  Total Bilirubin 0.3 - 1.2 mg/dL 0.3 <0.2(L) <0.2(L)  Alkaline Phos 38 - 126 U/L 74 68 74  AST 15 - 41 U/L 13(L) 12(L) 13(L)  ALT 0 - 44 U/L 8 <6 7      RADIOGRAPHIC STUDIES: I have personally reviewed the radiological images as listed and agreed with the findings in the report. No results found.   ASSESSMENT & PLAN:  BRITNEY House is a 60 y.o. 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 Extensive workup found the pt to have HHT based on her personal and familyhistoryand recurrentAVM GI bleedings. -Sheis s/pIR embolization of the left Gastric and short gastric artery branchvesselswithout evidence of  residual flow in the Dieulafoy lesionon 12/10/2017. -She has been on bevacizumabevery2-4 week,treatment has been intermittently held due to large proteinuria. She has been on IV Ferric Gluconate every 1-2 weeks (unless Ferritin >200)and blood transfusions as needed. -She had two instances of dark stool in the last week. Labs reviewed, WBC 12.8, Hg 7.5. 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 She is also on blood transfusionsas needed.  -She will receive IV Ferric Gluconate today and blood transfusion on Saturday.  3. Reactive Leukocytosis  -She has mild leukocytosis  with dominant neutrophils, likely reactive. Her prior thrombocytosis resolved.  -Mostlymild andstable.  4. 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 She required cauterization for stomach for severe Gi bleeding in 11/2018 -Continue to follow up with her GI, Dr. Cristina Gong -I started heron Amicar 07/17/18. Given DVT she will only take Amicar for severe epistaxis  5.History of right Arm DVT -She had right arm DVT that extended to her right clavicleon 12/07/2018.  -She is not a candidate for anticoagulation due to severe GI bleeding. We stopped Tamoxifen,will continuelow doseAmicar for epistaxisand GI bleeding. Will maintain [place] flushes.  6. Smoking cessation -She was smoking 10 cigarettes a day on average before recent hospitalization. She is smoking about 1/2 ppd (as of  08/03/20) -I strongly encouraged her to stop smoking completely. She tried previously but had trouble.  7. Hypokalemia -Onpotassium chloride 20 mEq daily, continue. -Has resolved in 05/2020.  8. HTN, uncontrolled, Recently diagnosed DM -Continue medications. Will monitor on Avastin.  -Continue to f/u with PCP   9. Anxiety, Insomnia -She has been stressed with family health issues (her husband's passing August 2021 and son's DM on transplant list). She has been more anxious and started having panic attacks.  -She is fine to use Xanax at 0.5mg  dose as needed, not daily. F/u with SW as needed for counseling. -She recently started Trazadone (06/15/20). This has helped her sleep better.  10. B/l LE Edema, Fungal infection of feet,inguinal reactivelymphadenopathy -Stable. Continue compression socksand leg elevation -She had a recent US of leg for her swelling, no DVT but she was found to havelymphadenopathy of b/l groin.She does have skin darkening of her legs which she has had in recent months. She also has fungal infection of her feetwith non-palpable inguinal Lymphadenopathy.  -She is currently on Lamisil topical cream for her infection. She will continue to f/u with Podiatrist   PLAN: -Proceed with IV Ferric Gluconate today -Scheduled 2u blood transfusion for Saturday, 08/05/20 -flush,labsandFerricGluconate 250mg  (unlessFerritin >200on last lab) twice a week for 2 weeks then weekly -f/u in 3 weeks    No problem-specific Assessment & Plan notes found for this encounter.   No orders of the defined types were placed in this encounter.  All questions were answered. The patient knows to call the clinic with any problems, questions or concerns. No barriers to learning was detected.       Truitt Merle, MD 08/03/2020   I, Wilburn Mylar, am acting as scribe for Truitt Merle, MD.   I have reviewed the above documentation for accuracy and completeness, and I agree with  the above.

## 2020-08-03 NOTE — Patient Instructions (Signed)
Sodium Ferric Gluconate Complex injection What is this medicine? SODIUM FERRIC GLUCONATE COMPLEX (SOE dee um FER ik GLOO koe nate KOM pleks) is an iron replacement. It is used with epoetin therapy to treat low iron levels in patients who are receiving hemodialysis. This medicine may be used for other purposes; ask your health care provider or pharmacist if you have questions. COMMON BRAND NAME(S): Ferrlecit, Nulecit What should I tell my health care provider before I take this medicine? They need to know if you have any of the following conditions:  anemia that is not from iron deficiency  high levels of iron in the body  an unusual or allergic reaction to iron, benzyl alcohol, other medicines, foods, dyes, or preservatives  pregnant or are trying to become pregnant  breast-feeding How should I use this medicine? This medicine is for infusion into a vein. It is given by a health care professional in a hospital or clinic setting. Talk to your pediatrician regarding the use of this medicine in children. While this drug may be prescribed for children as young as 6 years old for selected conditions, precautions do apply. Overdosage: If you think you have taken too much of this medicine contact a poison control center or emergency room at once. NOTE: This medicine is only for you. Do not share this medicine with others. What if I miss a dose? It is important not to miss your dose. Call your doctor or health care professional if you are unable to keep an appointment. What may interact with this medicine? Do not take this medicine with any of the following medications:  deferoxamine  dimercaprol  other iron products This medicine may also interact with the following medications:  chloramphenicol  deferasirox  medicine for blood pressure like enalapril This list may not describe all possible interactions. Give your health care provider a list of all the medicines, herbs,  non-prescription drugs, or dietary supplements you use. Also tell them if you smoke, drink alcohol, or use illegal drugs. Some items may interact with your medicine. What should I watch for while using this medicine? Your condition will be monitored carefully while you are receiving this medicine. Visit your doctor for check-ups as directed. What side effects may I notice from receiving this medicine? Side effects that you should report to your doctor or health care professional as soon as possible:  allergic reactions like skin rash, itching or hives, swelling of the face, lips, or tongue  breathing problems  changes in hearing  changes in vision  chills, flushing, or sweating  fast, irregular heartbeat  feeling faint or lightheaded, falls  fever, flu-like symptoms  high or low blood pressure  pain, tingling, numbness in the hands or feet  severe pain in the chest, back, flanks, or groin  swelling of the ankles, feet, hands  trouble passing urine or change in the amount of urine  unusually weak or tired Side effects that usually do not require medical attention (report to your doctor or health care professional if they continue or are bothersome):  cramps  dark colored stools  diarrhea  headache  nausea, vomiting  stomach upset This list may not describe all possible side effects. Call your doctor for medical advice about side effects. You may report side effects to FDA at 1-800-FDA-1088. Where should I keep my medicine? This drug is given in a hospital or clinic and will not be stored at home. NOTE: This sheet is a summary. It may not cover all   possible information. If you have questions about this medicine, talk to your doctor, pharmacist, or health care provider.  2021 Elsevier/Gold Standard (2007-12-02 15:58:57)   

## 2020-08-03 NOTE — Patient Instructions (Signed)
Tunneled Central Venous Catheter Flushing Guide  It is important to flush your tunneled central venous catheter each time you use it, both before and after you use it. Flushing your catheter will help prevent it from clogging. What are the risks? Risks may include:  Infection.  Air getting into the catheter and bloodstream. Supplies needed:  A clean pair of gloves.  A disinfecting wipe. Use an alcohol wipe, chlorhexidine wipe, or iodine wipe as told by your health care provider.  A 10 mL syringe that has been prefilled with saline solution.  An empty 10 mL syringe, if a substance called heparin was injected into your catheter. How to flush your catheter When you flush your catheter, make sure you follow any specific instructions from your health care provider or the manufacturer. These are general guidelines. Flushing your catheter before use If there is heparin in your catheter: 1. Wash your hands with soap and water. 2. Put on gloves. 3. Scrub the injection cap for a minimum of 15 seconds with a disinfecting wipe. 4. Unclamp the catheter. 5. Attach the empty syringe to the injection cap. 6. Pull the syringe plunger back and withdraw 10 mL of blood. 7. Place the syringe into an appropriate waste container. 8. Scrub the injection cap for 15 seconds with a disinfecting wipe. 9. Attach the prefilled syringe to the injection cap. 10. Flush the catheter by pushing the plunger forward until all the liquid from the syringe is in the catheter. 11. Remove the syringe from the injection cap. 12. Clamp the catheter. If there is no heparin in your catheter: 1. Wash your hands with soap and water. 2. Put on gloves. 3. Scrub the injection cap for 15 seconds with a disinfecting wipe. 4. Unclamp the catheter. 5. Attach the prefilled syringe to the injection cap. 6. Flush the catheter by pushing the plunger forward until 5 mL of the liquid from the syringe is in the catheter. 7. Pull back on  the syringe until you see blood in the catheter. 8. If you have been asked to collect any blood, follow your health care provider's instructions. Otherwise, flush the catheter with the rest of the solution from the syringe. 9. Remove the syringe from the injection cap. 10. Clamp the catheter.   Flushing your catheter after use 1. Wash your hands with soap and water. 2. Put on gloves. 3. Scrub the injection cap for 15 seconds with a disinfecting wipe. 4. Unclamp the catheter. 5. Attach the prefilled syringe to the injection cap. 6. Flush the catheter by pushing the plunger forward until all of the liquid from the syringe is in the catheter. 7. Remove the syringe from the injection cap. 8. Clamp the catheter. Problems and solutions  If blood cannot be completely cleared from the injection cap, you may need to have the injection cap replaced.  If the catheter is difficult to flush, use the pulsing method. The pulsing method involves pushing only a few milliliters of solution into the catheter at a time and pausing between pushes.  If you do not see blood in the catheter when you pull back on the syringe, change your body position, such as by raising your arms above your head. Take a deep breath and cough. Then, pull back on the syringe. If you still do not see blood, flush the catheter with a small amount of solution. Then, change positions again and take a breath or cough. Pull back on the syringe again. If you still do not   see blood, finish flushing the catheter and contact your health care provider. Do not use your catheter until your health care provider says it is okay. General tips  Have someone help you flush your catheter, if possible.  Do not force fluid through your catheter.  Do not use a syringe that is larger or smaller than 10 mL. Using a smaller syringe can make the catheter burst.  Do not use your catheter without flushing it first if it has heparin in it. Contact a health  care provider if:  You cannot see any blood in the catheter when you flush it before using it.  Your catheter is difficult to flush. Get help right away if:  You cannot flush the catheter.  The catheter leaks when you flush it or when there is fluid in it.  There are cracks or breaks in the catheter. Summary  It is important to flush your tunneled central venous catheter each time you use it, both before and after you use it.  Scrub the injection cap for 15 seconds with a disinfecting wipe before and after you flush it.  When you flush your catheter, make sure you follow any specific instructions from your health care provider or the manufacturer.  Get help right away if you cannot flush the catheter. This information is not intended to replace advice given to you by your health care provider. Make sure you discuss any questions you have with your health care provider. Document Revised: 06/10/2019 Document Reviewed: 06/17/2018 Elsevier Patient Education  2021 [employer] Inc.  

## 2020-08-04 ENCOUNTER — Ambulatory Visit: Payer: Medicaid Other | Admitting: Hematology

## 2020-08-04 ENCOUNTER — Other Ambulatory Visit: Payer: Medicaid Other

## 2020-08-04 ENCOUNTER — Ambulatory Visit: Payer: Medicaid Other

## 2020-08-05 ENCOUNTER — Other Ambulatory Visit: Payer: Self-pay

## 2020-08-05 ENCOUNTER — Inpatient Hospital Stay: Payer: Medicaid Other

## 2020-08-05 DIAGNOSIS — Z5112 Encounter for antineoplastic immunotherapy: Secondary | ICD-10-CM | POA: Diagnosis not present

## 2020-08-05 MED ORDER — SODIUM CHLORIDE 0.9% FLUSH
10.0000 mL | INTRAVENOUS | Status: AC | PRN
  Administered 2020-08-05: 10 mL
  Filled 2020-08-05: qty 10

## 2020-08-05 MED ORDER — ACETAMINOPHEN 325 MG PO TABS
650.0000 mg | ORAL_TABLET | Freq: Once | ORAL | Status: AC
  Administered 2020-08-05: 650 mg via ORAL

## 2020-08-05 MED ORDER — DIPHENHYDRAMINE HCL 25 MG PO CAPS
25.0000 mg | ORAL_CAPSULE | Freq: Once | ORAL | Status: AC
  Administered 2020-08-05: 25 mg via ORAL

## 2020-08-05 MED ORDER — SODIUM CHLORIDE 0.9% IV SOLUTION
250.0000 mL | Freq: Once | INTRAVENOUS | Status: AC
  Administered 2020-08-05: 250 mL via INTRAVENOUS
  Filled 2020-08-05: qty 250

## 2020-08-05 MED ORDER — HEPARIN SOD (PORK) LOCK FLUSH 100 UNIT/ML IV SOLN
500.0000 [IU] | Freq: Every day | INTRAVENOUS | Status: AC | PRN
Start: 2020-08-05 — End: 2020-08-05
  Administered 2020-08-05: 500 [IU]
  Filled 2020-08-05: qty 5

## 2020-08-05 NOTE — Progress Notes (Signed)
Patient scheduled for 2 units PRBC, however, she insisted on getting just one because per patient each time she gets 2 she bleeds it out from her nose. Verbal education provided.

## 2020-08-05 NOTE — Progress Notes (Signed)
Patient reiterated at time of blood completion that she would not be needing the second unit.

## 2020-08-05 NOTE — Patient Instructions (Signed)

## 2020-08-07 ENCOUNTER — Telehealth: Payer: Self-pay | Admitting: Hematology

## 2020-08-07 LAB — TYPE AND SCREEN
ABO/RH(D): A NEG
Antibody Screen: NEGATIVE
Unit division: 0
Unit division: 0

## 2020-08-07 LAB — BPAM RBC
Blood Product Expiration Date: 202205092359
Blood Product Expiration Date: 202205112359
ISSUE DATE / TIME: 202204230942
Unit Type and Rh: 600
Unit Type and Rh: 600

## 2020-08-07 NOTE — Telephone Encounter (Signed)
Scheduled follow-up appointments per 4/21 los. Patient is aware.

## 2020-08-08 ENCOUNTER — Telehealth: Payer: Self-pay | Admitting: Behavioral Health

## 2020-08-08 ENCOUNTER — Inpatient Hospital Stay: Payer: Medicaid Other

## 2020-08-08 ENCOUNTER — Other Ambulatory Visit: Payer: Self-pay

## 2020-08-08 VITALS — BP 120/58 | HR 72 | Temp 98.3°F | Resp 18

## 2020-08-08 DIAGNOSIS — D500 Iron deficiency anemia secondary to blood loss (chronic): Secondary | ICD-10-CM

## 2020-08-08 DIAGNOSIS — Z95828 Presence of other vascular implants and grafts: Secondary | ICD-10-CM

## 2020-08-08 DIAGNOSIS — Z5112 Encounter for antineoplastic immunotherapy: Secondary | ICD-10-CM | POA: Diagnosis not present

## 2020-08-08 MED ORDER — SODIUM CHLORIDE 0.9 % IV SOLN
Freq: Once | INTRAVENOUS | Status: AC
Start: 2020-08-08 — End: 2020-08-08
  Filled 2020-08-08: qty 250

## 2020-08-08 MED ORDER — SODIUM CHLORIDE 0.9 % IV SOLN
250.0000 mg | Freq: Once | INTRAVENOUS | Status: AC
  Administered 2020-08-08: 250 mg via INTRAVENOUS
  Filled 2020-08-08: qty 20

## 2020-08-08 MED ORDER — SODIUM CHLORIDE 0.9% FLUSH
10.0000 mL | Freq: Once | INTRAVENOUS | Status: AC
  Administered 2020-08-08: 10 mL
  Filled 2020-08-08: qty 10

## 2020-08-08 MED ORDER — HEPARIN SOD (PORK) LOCK FLUSH 100 UNIT/ML IV SOLN
500.0000 [IU] | Freq: Once | INTRAVENOUS | Status: AC
  Administered 2020-08-08: 500 [IU]
  Filled 2020-08-08: qty 5

## 2020-08-08 NOTE — Patient Instructions (Signed)
Sodium Ferric Gluconate Complex injection What is this medicine? SODIUM FERRIC GLUCONATE COMPLEX (SOE dee um FER ik GLOO koe nate KOM pleks) is an iron replacement. It is used with epoetin therapy to treat low iron levels in patients who are receiving hemodialysis. This medicine may be used for other purposes; ask your health care provider or pharmacist if you have questions. COMMON BRAND NAME(S): Ferrlecit, Nulecit What should I tell my health care provider before I take this medicine? They need to know if you have any of the following conditions:  anemia that is not from iron deficiency  high levels of iron in the body  an unusual or allergic reaction to iron, benzyl alcohol, other medicines, foods, dyes, or preservatives  pregnant or are trying to become pregnant  breast-feeding How should I use this medicine? This medicine is for infusion into a vein. It is given by a health care professional in a hospital or clinic setting. Talk to your pediatrician regarding the use of this medicine in children. While this drug may be prescribed for children as young as 6 years old for selected conditions, precautions do apply. Overdosage: If you think you have taken too much of this medicine contact a poison control center or emergency room at once. NOTE: This medicine is only for you. Do not share this medicine with others. What if I miss a dose? It is important not to miss your dose. Call your doctor or health care professional if you are unable to keep an appointment. What may interact with this medicine? Do not take this medicine with any of the following medications:  deferoxamine  dimercaprol  other iron products This medicine may also interact with the following medications:  chloramphenicol  deferasirox  medicine for blood pressure like enalapril This list may not describe all possible interactions. Give your health care provider a list of all the medicines, herbs,  non-prescription drugs, or dietary supplements you use. Also tell them if you smoke, drink alcohol, or use illegal drugs. Some items may interact with your medicine. What should I watch for while using this medicine? Your condition will be monitored carefully while you are receiving this medicine. Visit your doctor for check-ups as directed. What side effects may I notice from receiving this medicine? Side effects that you should report to your doctor or health care professional as soon as possible:  allergic reactions like skin rash, itching or hives, swelling of the face, lips, or tongue  breathing problems  changes in hearing  changes in vision  chills, flushing, or sweating  fast, irregular heartbeat  feeling faint or lightheaded, falls  fever, flu-like symptoms  high or low blood pressure  pain, tingling, numbness in the hands or feet  severe pain in the chest, back, flanks, or groin  swelling of the ankles, feet, hands  trouble passing urine or change in the amount of urine  unusually weak or tired Side effects that usually do not require medical attention (report to your doctor or health care professional if they continue or are bothersome):  cramps  dark colored stools  diarrhea  headache  nausea, vomiting  stomach upset This list may not describe all possible side effects. Call your doctor for medical advice about side effects. You may report side effects to FDA at 1-800-FDA-1088. Where should I keep my medicine? This drug is given in a hospital or clinic and will not be stored at home. NOTE: This sheet is a summary. It may not cover all   possible information. If you have questions about this medicine, talk to your doctor, pharmacist, or health care provider.  2021 Elsevier/Gold Standard (2007-12-02 15:58:57)   

## 2020-08-08 NOTE — Telephone Encounter (Signed)
Contacted Pt to check-in. Pt picked up prescription changed by Dr. Leonides Cave, MD on Sat & has been taking it since. She reports she has not noticed a big change, but is confident it will help her.  Pt appreciative of call & will have f/u appt w/Dr. Theodis Shove on Wed.   Dr. Theodis Shove

## 2020-08-09 ENCOUNTER — Ambulatory Visit: Payer: Medicaid Other | Admitting: Behavioral Health

## 2020-08-09 DIAGNOSIS — F4321 Adjustment disorder with depressed mood: Secondary | ICD-10-CM

## 2020-08-09 NOTE — BH Specialist Note (Signed)
Integrated Behavioral Health via Telemedicine Visit  08/09/2020 Peggy House [phone]  Number of [place] visits: 3/6 Session Start time: 1:50pm  Session End time: 2:30pm Total time: 40   Referring Provider: Dr. Molli Hazard, DO Patient/Family location: Pt in car w/Peggy during today's session discussing Pt's Husb's unexpected death [facility] Provider location: Working remotely in private All persons participating in visit: Pt & younger Peggy House with Clinician Types of Service: Family psychotherapy  I connected with Peggy House and/or Peggy House and Peggy House via  Telephone or [facility] Telemedicine Application  (Video is Caregility application) and verified that I am speaking with the correct person using two identifiers. Discussed confidentiality: Yes   I discussed the limitations of telemedicine and the availability of in person appointments.  Discussed there is a possibility of technology failure and discussed alternative modes of communication if that failure occurs.  I discussed that engaging in this telemedicine visit, they consent to the provision of behavioral healthcare and the services will be billed under their insurance.  Patient and/or legal guardian expressed understanding and consented to Telemedicine visit: Yes   Presenting Concerns: Patient and/or family reports the following symptoms/concerns: sadness & adjustment to unexpected & sudden death of Pt's Husb Duration of problem: months; Severity of problem: mild to moderate Patient and/or Family's Strengths/Protective Factors: Social connections, Social and Emotional competence, Concrete supports in place (healthy food, safe environments, etc.), Sense of purpose and Family resiliency factors  Goals Addressed: Patient will: 1.  Reduce symptoms of: grief as family works through rxn to death & adjustment issues in family dynamics  2.  Increase knowledge and/or  ability of: coping skills  3.  Demonstrate ability to: Increase healthy adjustment to current life circumstances  Progress towards Goals: Ongoing  Interventions: Interventions utilized:  Family Therapy session today Standardized Assessments completed: Not Needed  Patient and/or Family Response: Pt receptive to call & requests future session   Assessment: Patient currently experiencing inc'd adjustment to family dynamics & Adult children's transition. Peggy House her Mother, Angelica House also adjusting to loss.  Patient may benefit from cont'd support w/grief & loss cslg.  Plan: 1. Follow up with behavioral health clinician on : First available in May for 60 min telehealth 2. Behavioral recommendations: cont to support ea other & do [facility]- 3. Referral(s): [facility] (In Clinic)  I discussed the assessment and treatment plan with the patient and/or parent/guardian. They were provided an opportunity to ask questions and all were answered. They agreed with the plan and demonstrated an understanding of the instructions.   They were advised to call back or seek an in-person evaluation if the symptoms worsen or if the condition fails to improve as anticipated.  Peggy Hutching, LMFT

## 2020-08-10 ENCOUNTER — Other Ambulatory Visit: Payer: Medicaid Other

## 2020-08-10 ENCOUNTER — Ambulatory Visit: Payer: Medicaid Other

## 2020-08-10 ENCOUNTER — Telehealth: Payer: Self-pay

## 2020-08-10 NOTE — Telephone Encounter (Signed)
I called Peggy House regarding missed appts today.  She thought her appts were at 1300.  Infusion was able to extend her infusion on 5/2 to include avastin.  Port flush with lab appt added.  I reviewed these appts with Peggy House.  She verbalized understanding.

## 2020-08-11 ENCOUNTER — Ambulatory Visit: Payer: Medicaid Other

## 2020-08-11 ENCOUNTER — Other Ambulatory Visit: Payer: Medicaid Other

## 2020-08-11 DIAGNOSIS — N3941 Urge incontinence: Secondary | ICD-10-CM | POA: Diagnosis not present

## 2020-08-11 DIAGNOSIS — N319 Neuromuscular dysfunction of bladder, unspecified: Secondary | ICD-10-CM | POA: Diagnosis not present

## 2020-08-14 ENCOUNTER — Other Ambulatory Visit: Payer: Self-pay

## 2020-08-14 ENCOUNTER — Inpatient Hospital Stay: Payer: Medicaid Other | Attending: Hematology

## 2020-08-14 ENCOUNTER — Inpatient Hospital Stay: Payer: Medicaid Other

## 2020-08-14 VITALS — BP 138/53 | HR 80 | Temp 99.1°F | Resp 18

## 2020-08-14 DIAGNOSIS — D500 Iron deficiency anemia secondary to blood loss (chronic): Secondary | ICD-10-CM | POA: Diagnosis present

## 2020-08-14 DIAGNOSIS — Z5112 Encounter for antineoplastic immunotherapy: Secondary | ICD-10-CM | POA: Insufficient documentation

## 2020-08-14 DIAGNOSIS — R808 Other proteinuria: Secondary | ICD-10-CM

## 2020-08-14 DIAGNOSIS — Z95828 Presence of other vascular implants and grafts: Secondary | ICD-10-CM

## 2020-08-14 DIAGNOSIS — I780 Hereditary hemorrhagic telangiectasia: Secondary | ICD-10-CM | POA: Insufficient documentation

## 2020-08-14 LAB — CMP (CANCER CENTER ONLY)
ALT: <6 U/L (ref 0–44)
AST: 12 U/L — ABNORMAL LOW (ref 15–41)
Albumin: 3.4 g/dL — ABNORMAL LOW (ref 3.5–5.0)
Alkaline Phosphatase: 67 U/L (ref 38–126)
Anion gap: 10 (ref 5–15)
BUN: 7 mg/dL (ref 6–20)
CO2: 25 mmol/L (ref 22–32)
Calcium: 8.8 mg/dL — ABNORMAL LOW (ref 8.9–10.3)
Chloride: 106 mmol/L (ref 98–111)
Creatinine: 0.78 mg/dL (ref 0.44–1.00)
GFR, Estimated: >60 mL/min (ref >60)
Glucose, Bld: 107 mg/dL — ABNORMAL HIGH (ref 70–99)
Potassium: 3.5 mmol/L (ref 3.5–5.1)
Sodium: 141 mmol/L (ref 135–145)
Total Bilirubin: <0.2 mg/dL — ABNORMAL LOW (ref 0.3–1.2)
Total Protein: 7 g/dL (ref 6.5–8.1)

## 2020-08-14 LAB — CBC WITH DIFFERENTIAL (CANCER CENTER ONLY)
Abs Immature Granulocytes: 0.08 10*3/uL — ABNORMAL HIGH (ref 0.00–0.07)
Basophils Absolute: 0.1 10*3/uL (ref 0.0–0.1)
Basophils Relative: 0 %
Eosinophils Absolute: 0.1 10*3/uL (ref 0.0–0.5)
Eosinophils Relative: 1 %
HCT: 24.7 % — ABNORMAL LOW (ref 36.0–46.0)
Hemoglobin: 7.2 g/dL — ABNORMAL LOW (ref 12.0–15.0)
Immature Granulocytes: 1 %
Lymphocytes Relative: 11 %
Lymphs Abs: 1.4 10*3/uL (ref 0.7–4.0)
MCH: 26.7 pg (ref 26.0–34.0)
MCHC: 29.1 g/dL — ABNORMAL LOW (ref 30.0–36.0)
MCV: 91.5 fL (ref 80.0–100.0)
Monocytes Absolute: 0.6 10*3/uL (ref 0.1–1.0)
Monocytes Relative: 5 %
Neutro Abs: 10.7 10*3/uL — ABNORMAL HIGH (ref 1.7–7.7)
Neutrophils Relative %: 82 %
Platelet Count: 280 10*3/uL (ref 150–400)
RBC: 2.7 MIL/uL — ABNORMAL LOW (ref 3.87–5.11)
RDW: 21.1 % — ABNORMAL HIGH (ref 11.5–15.5)
WBC Count: 12.9 10*3/uL — ABNORMAL HIGH (ref 4.0–10.5)
nRBC: 0 % (ref 0.0–0.2)

## 2020-08-14 MED ORDER — SODIUM CHLORIDE 0.9 % IV SOLN
5.0000 mg/kg | Freq: Once | INTRAVENOUS | Status: AC
  Administered 2020-08-14: 600 mg via INTRAVENOUS
  Filled 2020-08-14: qty 16

## 2020-08-14 MED ORDER — HEPARIN SOD (PORK) LOCK FLUSH 100 UNIT/ML IV SOLN
500.0000 [IU] | Freq: Once | INTRAVENOUS | Status: AC | PRN
  Administered 2020-08-14: 500 [IU]
  Filled 2020-08-14: qty 5

## 2020-08-14 MED ORDER — SODIUM CHLORIDE 0.9% FLUSH
10.0000 mL | INTRAVENOUS | Status: DC | PRN
Start: 2020-08-14 — End: 2021-12-28
  Administered 2020-08-14: 10 mL
  Filled 2020-08-14: qty 10

## 2020-08-14 MED ORDER — SODIUM CHLORIDE 0.9 % IV SOLN
Freq: Once | INTRAVENOUS | Status: AC
Start: 2020-08-14 — End: 2020-08-14
  Filled 2020-08-14: qty 250

## 2020-08-14 MED ORDER — SODIUM CHLORIDE 0.9 % IV SOLN
250.0000 mg | Freq: Once | INTRAVENOUS | Status: AC
  Administered 2020-08-14: 250 mg via INTRAVENOUS
  Filled 2020-08-14: qty 20

## 2020-08-14 MED ORDER — SODIUM CHLORIDE 0.9% FLUSH
10.0000 mL | Freq: Once | INTRAVENOUS | Status: AC
  Administered 2020-08-14: 10 mL
  Filled 2020-08-14: qty 10

## 2020-08-14 NOTE — Progress Notes (Unsigned)
Per Dr. Burr Medico, okay to treat with today's lab results and without urine protein.

## 2020-08-16 ENCOUNTER — Other Ambulatory Visit: Payer: Self-pay | Admitting: Nurse Practitioner

## 2020-08-16 DIAGNOSIS — I10 Essential (primary) hypertension: Secondary | ICD-10-CM

## 2020-08-17 ENCOUNTER — Other Ambulatory Visit: Payer: Self-pay

## 2020-08-17 ENCOUNTER — Inpatient Hospital Stay: Payer: Medicaid Other

## 2020-08-17 ENCOUNTER — Other Ambulatory Visit: Payer: Medicaid Other

## 2020-08-17 VITALS — BP 133/58 | HR 68 | Temp 98.6°F | Resp 18

## 2020-08-17 DIAGNOSIS — D500 Iron deficiency anemia secondary to blood loss (chronic): Secondary | ICD-10-CM

## 2020-08-17 DIAGNOSIS — I780 Hereditary hemorrhagic telangiectasia: Secondary | ICD-10-CM

## 2020-08-17 DIAGNOSIS — Z95828 Presence of other vascular implants and grafts: Secondary | ICD-10-CM

## 2020-08-17 DIAGNOSIS — Z5112 Encounter for antineoplastic immunotherapy: Secondary | ICD-10-CM | POA: Diagnosis not present

## 2020-08-17 LAB — PREPARE RBC (CROSSMATCH)

## 2020-08-17 LAB — CBC WITH DIFFERENTIAL (CANCER CENTER ONLY)
Abs Immature Granulocytes: 0.07 10*3/uL (ref 0.00–0.07)
Basophils Absolute: 0 10*3/uL (ref 0.0–0.1)
Basophils Relative: 0 %
Eosinophils Absolute: 0.1 10*3/uL (ref 0.0–0.5)
Eosinophils Relative: 1 %
HCT: 25 % — ABNORMAL LOW (ref 36.0–46.0)
Hemoglobin: 7.2 g/dL — ABNORMAL LOW (ref 12.0–15.0)
Immature Granulocytes: 1 %
Lymphocytes Relative: 12 %
Lymphs Abs: 1.4 10*3/uL (ref 0.7–4.0)
MCH: 26.6 pg (ref 26.0–34.0)
MCHC: 28.8 g/dL — ABNORMAL LOW (ref 30.0–36.0)
MCV: 92.3 fL (ref 80.0–100.0)
Monocytes Absolute: 0.6 10*3/uL (ref 0.1–1.0)
Monocytes Relative: 5 %
Neutro Abs: 10.3 10*3/uL — ABNORMAL HIGH (ref 1.7–7.7)
Neutrophils Relative %: 81 %
Platelet Count: 288 10*3/uL (ref 150–400)
RBC: 2.71 MIL/uL — ABNORMAL LOW (ref 3.87–5.11)
RDW: 22.5 % — ABNORMAL HIGH (ref 11.5–15.5)
WBC Count: 12.5 10*3/uL — ABNORMAL HIGH (ref 4.0–10.5)
nRBC: 0.2 % (ref 0.0–0.2)

## 2020-08-17 LAB — SAMPLE TO BLOOD BANK

## 2020-08-17 MED ORDER — NA FERRIC GLUC CPLX IN SUCROSE 12.5 MG/ML IV SOLN
250.0000 mg | Freq: Once | INTRAVENOUS | Status: AC
Start: 2020-08-17 — End: 2020-08-17
  Administered 2020-08-17: 250 mg via INTRAVENOUS
  Filled 2020-08-17: qty 20

## 2020-08-17 MED ORDER — SODIUM CHLORIDE 0.9 % IV SOLN
250.0000 mg | Freq: Once | INTRAVENOUS | Status: DC
  Filled 2020-08-17: qty 20

## 2020-08-17 NOTE — Patient Instructions (Signed)
Sodium Ferric Gluconate Complex injection What is this medicine? SODIUM FERRIC GLUCONATE COMPLEX (SOE dee um FER ik GLOO koe nate KOM pleks) is an iron replacement. It is used with epoetin therapy to treat low iron levels in patients who are receiving hemodialysis. This medicine may be used for other purposes; ask your health care provider or pharmacist if you have questions. COMMON BRAND NAME(S): Ferrlecit, Nulecit What should I tell my health care provider before I take this medicine? They need to know if you have any of the following conditions:  anemia that is not from iron deficiency  high levels of iron in the body  an unusual or allergic reaction to iron, benzyl alcohol, other medicines, foods, dyes, or preservatives  pregnant or are trying to become pregnant  breast-feeding How should I use this medicine? This medicine is for infusion into a vein. It is given by a health care professional in a hospital or clinic setting. Talk to your pediatrician regarding the use of this medicine in children. While this drug may be prescribed for children as young as 6 years old for selected conditions, precautions do apply. Overdosage: If you think you have taken too much of this medicine contact a poison control center or emergency room at once. NOTE: This medicine is only for you. Do not share this medicine with others. What if I miss a dose? It is important not to miss your dose. Call your doctor or health care professional if you are unable to keep an appointment. What may interact with this medicine? Do not take this medicine with any of the following medications:  deferoxamine  dimercaprol  other iron products This medicine may also interact with the following medications:  chloramphenicol  deferasirox  medicine for blood pressure like enalapril This list may not describe all possible interactions. Give your health care provider a list of all the medicines, herbs,  non-prescription drugs, or dietary supplements you use. Also tell them if you smoke, drink alcohol, or use illegal drugs. Some items may interact with your medicine. What should I watch for while using this medicine? Your condition will be monitored carefully while you are receiving this medicine. Visit your doctor for check-ups as directed. What side effects may I notice from receiving this medicine? Side effects that you should report to your doctor or health care professional as soon as possible:  allergic reactions like skin rash, itching or hives, swelling of the face, lips, or tongue  breathing problems  changes in hearing  changes in vision  chills, flushing, or sweating  fast, irregular heartbeat  feeling faint or lightheaded, falls  fever, flu-like symptoms  high or low blood pressure  pain, tingling, numbness in the hands or feet  severe pain in the chest, back, flanks, or groin  swelling of the ankles, feet, hands  trouble passing urine or change in the amount of urine  unusually weak or tired Side effects that usually do not require medical attention (report to your doctor or health care professional if they continue or are bothersome):  cramps  dark colored stools  diarrhea  headache  nausea, vomiting  stomach upset This list may not describe all possible side effects. Call your doctor for medical advice about side effects. You may report side effects to FDA at 1-800-FDA-1088. Where should I keep my medicine? This drug is given in a hospital or clinic and will not be stored at home. NOTE: This sheet is a summary. It may not cover all   possible information. If you have questions about this medicine, talk to your doctor, pharmacist, or health care provider.  2021 Elsevier/Gold Standard (2007-12-02 15:58:57)   

## 2020-08-18 ENCOUNTER — Other Ambulatory Visit: Payer: Medicaid Other

## 2020-08-18 ENCOUNTER — Telehealth: Payer: Self-pay | Admitting: Hematology

## 2020-08-18 ENCOUNTER — Ambulatory Visit: Payer: Medicaid Other

## 2020-08-18 LAB — IRON AND TIBC
Iron: 33 ug/dL — ABNORMAL LOW (ref 41–142)
Saturation Ratios: 15 % — ABNORMAL LOW (ref 21–57)
TIBC: 226 ug/dL — ABNORMAL LOW (ref 236–444)
UIBC: 193 ug/dL (ref 120–384)

## 2020-08-18 NOTE — Telephone Encounter (Signed)
Scheduled appts per 5/5 sch msg. Pt aware.  

## 2020-08-19 ENCOUNTER — Other Ambulatory Visit: Payer: Self-pay

## 2020-08-19 ENCOUNTER — Inpatient Hospital Stay: Payer: Medicaid Other

## 2020-08-19 DIAGNOSIS — D500 Iron deficiency anemia secondary to blood loss (chronic): Secondary | ICD-10-CM

## 2020-08-19 DIAGNOSIS — Z5112 Encounter for antineoplastic immunotherapy: Secondary | ICD-10-CM | POA: Diagnosis not present

## 2020-08-19 MED ORDER — SODIUM CHLORIDE 0.9% IV SOLUTION
250.0000 mL | Freq: Once | INTRAVENOUS | Status: AC
  Administered 2020-08-19: 250 mL via INTRAVENOUS
  Filled 2020-08-19: qty 250

## 2020-08-19 MED ORDER — HEPARIN SOD (PORK) LOCK FLUSH 100 UNIT/ML IV SOLN
500.0000 [IU] | Freq: Every day | INTRAVENOUS | Status: AC | PRN
  Administered 2020-08-19: 500 [IU]
  Filled 2020-08-19: qty 5

## 2020-08-19 MED ORDER — SODIUM CHLORIDE 0.9% FLUSH
10.0000 mL | INTRAVENOUS | Status: AC | PRN
  Administered 2020-08-19: 10 mL
  Filled 2020-08-19: qty 10

## 2020-08-19 NOTE — Patient Instructions (Signed)

## 2020-08-21 LAB — TYPE AND SCREEN
ABO/RH(D): A NEG
Antibody Screen: NEGATIVE
Unit division: 0

## 2020-08-21 LAB — BPAM RBC
Blood Product Expiration Date: 202205312359
ISSUE DATE / TIME: 202205070940
Unit Type and Rh: 600

## 2020-08-22 NOTE — Progress Notes (Signed)
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 Increased to weekly on 07/29/19.Reduced to every 2 weeks on 11/12/19 -RestartedAvastin/Zirabevq2weeks on 04/03/18. Reduced to every 4 weeks starting 09/25/19. Increased to every 2 weeks on 01/06/20. Increased dose to 250mg  on 01/20/20 -Amicar 1g bidstarting4/06/2018. Reduced to only as needed on 12/10/18 due toDVT. Restart daily on 06/16/20 due to increased bleeding.Increased to BID, then TID on 07/07/20.  INTERVAL HISTORY: Ms. Kulick returns for f/up and treatment as scheduled. She was last seen by Dr. Burr Medico 08/03/20. She continues weekly IV iron and q2 weeks avastin. Hgb has been slowly dropping and she received 1 u RBC on 08/19/20 for Hgb 7.2. she feels better after blood. She is having nose bleeds with large clots twice per week. She recently increased amica to TID per instruction. Denies hematochezia or melena. Denies fever, pain, cough, dyspnea, or other new concerns. She continues to grieve her husband's death, their anniversary is next month. She is dealing with legal issues. Talks to her therapist q2 weeks.   All other  systems were reviewed with the patient and are negative.  MEDICAL HISTORY:  Past Medical History:  Diagnosis Date  . Anxiety   . Arthritis    knnes,back  . GERD (gastroesophageal reflux disease)   . 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 History of swelling of feet   . Hyperlipidemia   . Hypertension   . Major depressive disorder, recurrent episode ([place]) 06/05/2015  . Obesity   . Snores   . 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 ABDOMINAL HYSTERECTOMY    . CARPAL TUNNEL RELEASE  05/13/2011   Procedure: CARPAL TUNNEL RELEASE;  Surgeon: Nita Sells, MD;  Location: [facility];  Service: Orthopedics;  Laterality: Left;  . COLONOSCOPY N/A 03/02/2020   Procedure: COLONOSCOPY;  Surgeon: Ronald Lobo, MD;  Location: [facility] ENDOSCOPY;  Service: Endoscopy;  Laterality: N/A;  . COLONOSCOPY WITH PROPOFOL N/A 04/28/2014   Procedure: COLONOSCOPY WITH PROPOFOL;  Surgeon: Cleotis Nipper, MD;  Location: [facility] ENDOSCOPY;  Service: Endoscopy;  Laterality: N/A;  . DG TOES*L*  2/10   rt  . DILATION AND CURETTAGE OF UTERUS    . ENTEROSCOPY N/A 10/17/2017   Procedure: ENTEROSCOPY;  Surgeon: Otis Brace, MD;  Location: [place] ENDOSCOPY;  Service: Gastroenterology;  Laterality: N/A;  . ESOPHAGOGASTRODUODENOSCOPY N/A 04/10/2014   Procedure: ESOPHAGOGASTRODUODENOSCOPY (EGD);  Surgeon: [name], MD;  Location[facility] ENDOSCOPY;  Service: Endoscopy;  Laterality: N/A;  . ESOPHAGOGASTRODUODENOSCOPY N/A 05/10/2017   Procedure: ESOPHAGOGASTRODUODENOSCOPY (EGD);  Surgeon: Ronald Lobo, MD;  Location: [facility] ENDOSCOPY;  Service: Endoscopy;  Laterality: N/A;  . ESOPHAGOGASTRODUODENOSCOPY N/A 09/22/2017   Procedure: ESOPHAGOGASTRODUODENOSCOPY (EGD);  Surgeon: Clarene Essex, MD;  Location: [place];  Service: Endoscopy;  Laterality: N/A;  bedside  . ESOPHAGOGASTRODUODENOSCOPY N/A  03/02/2020   Procedure: ESOPHAGOGASTRODUODENOSCOPY (EGD);  Surgeon: Ronald Lobo, MD;  Location: [facility];  Service: Endoscopy;  Laterality: N/A;  . ESOPHAGOGASTRODUODENOSCOPY (EGD) WITH PROPOFOL N/A 04/27/2014   Procedure: ESOPHAGOGASTRODUODENOSCOPY (EGD) WITH PROPOFOL;  Surgeon: Cleotis Nipper, MD;  Location: [place];  Service: Endoscopy;  Laterality: N/A;  possible apc  . ESOPHAGOGASTRODUODENOSCOPY (EGD) WITH PROPOFOL N/A 09/30/2017   Procedure: ESOPHAGOGASTRODUODENOSCOPY (EGD) WITH PROPOFOL;  Surgeon: [name], MD;  Location: [place];  Service: Gastroenterology;  Laterality: N/A;  . ESOPHAGOGASTRODUODENOSCOPY (EGD) WITH PROPOFOL N/A 10/01/2017   Procedure: ESOPHAGOGASTRODUODENOSCOPY (EGD) WITH PROPOFOL;  Surgeon: [name], MD;  Location: [place];  Service: Gastroenterology;  Laterality: N/A;  . ESOPHAGOGASTRODUODENOSCOPY (EGD) WITH PROPOFOL N/A 10/08/2017   Procedure: ESOPHAGOGASTRODUODENOSCOPY (EGD) WITH PROPOFOL;  Surgeon: Otis Brace, MD;  Location: [facility] ENDOSCOPY;  Service: Gastroenterology;  Laterality: N/A;  . ESOPHAGOGASTRODUODENOSCOPY (EGD) WITH PROPOFOL N/A 10/17/2017   Procedure: ESOPHAGOGASTRODUODENOSCOPY (EGD) WITH PROPOFOL;  Surgeon: Otis Brace, MD;  Location: [facility] ENDOSCOPY;  Service: Gastroenterology;  Laterality: N/A;  . ESOPHAGOGASTRODUODENOSCOPY (EGD) WITH PROPOFOL N/A 10/19/2017   Procedure: ESOPHAGOGASTRODUODENOSCOPY (EGD) WITH PROPOFOL;  Surgeon: Otis Brace, MD;  Location: [facility] ENDOSCOPY;  Service: Gastroenterology;  Laterality: N/A;  . ESOPHAGOGASTRODUODENOSCOPY (EGD) WITH PROPOFOL N/A 12/04/2018   Procedure: ESOPHAGOGASTRODUODENOSCOPY (EGD) WITH PROPOFOL;  Surgeon: Wilford Corner, MD;  Location: [facility] ENDOSCOPY;  Service: Endoscopy;  Laterality: N/A;  . GIVENS CAPSULE STUDY N/A 10/02/2017   Procedure: GIVENS CAPSULE STUDY;  Surgeon: [name], MD;  Location: [place];  Service: Gastroenterology;  Laterality: N/A;  . GIVENS CAPSULE  STUDY N/A 10/08/2017   Procedure: GIVENS CAPSULE STUDY;  Surgeon: Otis Brace, MD;  Location: [place];  Service: Gastroenterology;  Laterality: N/A;  endoscopic placement of capsule  . GIVENS CAPSULE STUDY N/A 03/02/2020   Procedure: GIVENS CAPSULE STUDY;  Surgeon: Ronald Lobo, MD;  Location: [facility];  Service: Endoscopy;  Laterality: N/A;  . HEMOSTASIS CLIP PLACEMENT  12/04/2018   Procedure: HEMOSTASIS CLIP PLACEMENT;  Surgeon: Wilford Corner, MD;  [facility];  Service: Endoscopy;;  . HOT HEMOSTASIS N/A 04/27/2014   Procedure: HOT HEMOSTASIS (ARGON PLASMA COAGULATION/BICAP);  Surgeon: Cleotis Nipper, MD;  Location: [facility] ENDOSCOPY;  Service: Endoscopy;  Laterality: N/A;  . HOT HEMOSTASIS N/A 09/30/2017   Procedure: HOT HEMOSTASIS (ARGON PLASMA COAGULATION/BICAP);  Surgeon: [name], MD;  Location: [place];  Service: Gastroenterology;  Laterality: N/A;  . HOT HEMOSTASIS N/A 10/01/2017   Procedure: HOT HEMOSTASIS (ARGON PLASMA COAGULATION/BICAP);  Surgeon: [name], MD;  Location: [place];  Service: Gastroenterology;  Laterality: N/A;  . HOT HEMOSTASIS N/A 10/17/2017   Procedure: HOT HEMOSTASIS (ARGON PLASMA COAGULATION/BICAP);  Surgeon: Otis Brace, MD;  Location: [facility] ENDOSCOPY;  Service: Gastroenterology;  Laterality: N/A;  . HOT HEMOSTASIS N/A 10/19/2017   Procedure: HOT HEMOSTASIS (ARGON PLASMA COAGULATION/BICAP);  Surgeon: Otis Brace, MD;  Location: [facility] ENDOSCOPY;  Service: Gastroenterology;  Laterality: N/A;  . HOT HEMOSTASIS N/A 03/02/2020   Procedure: HOT HEMOSTASIS (ARGON PLASMA COAGULATION/BICAP);  Surgeon: Ronald Lobo, MD;  Location: [facility];  Service: Endoscopy;  Laterality: N/A;  . IR IMAGING GUIDED PORT INSERTION  07/08/2018  . L shoulder Surgery  2011  . POLYPECTOMY  03/02/2020   Procedure: POLYPECTOMY;  Surgeon: Ronald Lobo, MD;  Location: [facility] ENDOSCOPY;  Service: Endoscopy;;  . SUBMUCOSAL INJECTION  09/22/2017    Procedure: SUBMUCOSAL INJECTION;  Surgeon: Clarene Essex, MD;  Location: [place] ENDOSCOPY;  Service: Endoscopy;;  . SUBMUCOSAL INJECTION  12/04/2018   Procedure: SUBMUCOSAL INJECTION;  Surgeon: Wilford Corner, MD;  Location: [facility] ENDOSCOPY;  Service: Endoscopy;;    I have reviewed the social history and family history with the patient and they are unchanged from previous note.  ALLERGIES:  is allergic to feraheme [ferumoxytol], nsaids, tomato, iron (ferrous sulfate) [ferrous sulfate er], other, and wasp venom.  MEDICATIONS:  Current Outpatient Medications  Medication Sig Dispense Refill  . Accu-Chek Softclix Lancets lancets Use to check blood sugar before breakfast and before dinner while on steroids 100 each 1  . acitretin (SORIATANE) 10 MG capsule Take 10 mg by mouth daily.    [name] albuterol (VENTOLIN HFA) 108 (90 Base) MCG/ACT inhaler Inhale 1-2 puffs into the lungs every 6 (six) hours as needed for wheezing or shortness of breath. 1 each 1  . Aminocaproic Acid 1000 MG TABS Take 1 tablet (1,000 mg total) by mouth in the morning and at bedtime. TAKE 1 TABLET(1000 MG) BY MOUTH TWICE DAILY 60 tablet 3  . amLODipine (NORVASC) 10 MG tablet TAKE 1 TABLET(10 MG) BY MOUTH DAILY 90 tablet 0  . atorvastatin (LIPITOR) 80 MG tablet TAKE 1 TABLET(80 MG) BY MOUTH DAILY (Patient taking differently: Take 80 mg by mouth daily. TAKE 1 TABLET(80 MG) BY MOUTH DAILY) 30 tablet 3  . cetirizine (ZYRTEC) 10 MG tablet TAKE 1 TABLET(10 MG) BY MOUTH DAILY 90 tablet 1  . diphenoxylate-atropine (LOMOTIL) 2.5-0.025 MG tablet 1 to 2 PO QID prn diarrhea (Patient taking differently: Take 1-2 tablets by mouth 4 (four) times daily as needed for diarrhea or loose stools.) 30 tablet 1  . ferrous gluconate (FERGON) 324 MG tablet 1 tablet    . furosemide (LASIX) 40 MG tablet 1 tablet    . glucose blood (ACCU-CHEK GUIDE) test strip Check blood sugar 2 times per day while on steroids before breakfast and dinner 50 each 3  .  hydrochlorothiazide (HYDRODIURIL) 12.5 MG tablet TAKE 1 TABLET(12.5 MG) BY MOUTH DAILY 90 tablet 1  . lidocaine-prilocaine (EMLA) cream Apply 1 application topically as needed. 30 g 0  . metFORMIN (GLUCOPHAGE) 500 MG tablet Take 1 tablet (500 mg total) by mouth 2 (two) times daily with a meal. 180 tablet 3  . metoprolol succinate (TOPROL-XL) 100 MG 24 hr tablet 1 tablet    . pantoprazole (PROTONIX) 40 MG tablet TAKE 1 TABLET(40 MG) BY MOUTH TWICE DAILY 60 tablet 5  . Podiatric Products (FLEXITOL HEEL BALM) OINT Apply 1 application topically as needed (dry skin).     . potassium chloride (KLOR-CON) 20 MEQ packet Take 20 mEq by mouth daily. 30 packet 1  . prochlorperazine (COMPAZINE) 10 MG tablet Take 1 tablet (10 mg total) by mouth every 6 (six) hours as needed for nausea or vomiting. 30 tablet 1  . sertraline (ZOLOFT) 100 MG tablet TAKE 1 TABLET(100 MG) BY MOUTH DAILY (Patient taking differently: Take 100 mg by mouth daily.) 90 tablet 1  . sertraline (ZOLOFT) 25 MG tablet Take 1 tablet (25 mg total) by mouth daily. 30 tablet 2  . terbinafine (LAMISIL AT) 1 % cream Apply 1 application topically 2 (two) times daily. Both bottom and top of both feet and toes 36 g 0  . traZODone (DESYREL) 100 MG tablet Take 1 tablet (100 mg total) by mouth at bedtime. 30 tablet 2  . ALPRAZolam (XANAX) 0.5 MG tablet Take 1 tablet (0.5 mg total) by mouth at bedtime as needed for anxiety.  30 tablet 0   No current facility-administered medications for this visit.   Facility-Administered Medications Ordered in Other Visits  Medication Dose Route Frequency Provider Last Rate Last Admin  . ferric gluconate (FERRLECIT) 250 mg in sodium chloride 0.9 % 250 mL IVPB  250 mg Intravenous Once Truitt Merle, MD      . sodium chloride flush (NS) 0.9 % injection 10 mL  10 mL Intracatheter PRN Truitt Merle, MD   10 mL at 08/14/20 1725    PHYSICAL EXAMINATION:  Vitals:   08/24/20 1217  BP: (!) 142/73  Pulse: 71  Resp: 18  Temp: (!)  97.3 F (36.3 C)  SpO2: 100%   Filed Weights   08/24/20 1217  Weight: 253 lb 1.6 oz (114.8 kg)    GENERAL:alert, no distress and comfortable SKIN: no rash  EYES: sclera clear LUNGS:  normal breathing effort HEART: no lower extremity edema NEURO: alert & oriented x 3 with fluent speech, no focal motor deficits PAC without erythema   LABORATORY DATA:  I have reviewed the data as listed CBC Latest Ref Rng & Units 08/24/2020 08/17/2020 08/14/2020  WBC 4.0 - 10.5 K/uL 12.5(H) 12.5(H) 12.9(H)  Hemoglobin 12.0 - 15.0 g/dL 9.4(L) 7.2(L) 7.2(L)  Hematocrit 36.0 - 46.0 % 32.2(L) 25.0(L) 24.7(L)  Platelets 150 - 400 K/uL 267 288 280     CMP Latest Ref Rng & Units 08/14/2020 07/14/2020 06/23/2020  Glucose 70 - 99 mg/dL 107(H) 123(H) 119(H)  BUN 6 - 20 mg/dL 7 9 12   Creatinine 0.44 - 1.00 mg/dL 0.78 0.80 0.92  Sodium 135 - 145 mmol/L 141 142 145  Potassium 3.5 - 5.1 mmol/L 3.5 3.8 3.4(L)  Chloride 98 - 111 mmol/L 106 106 109  CO2 22 - 32 mmol/L 25 24 23   Calcium 8.9 - 10.3 mg/dL 8.8(L) 8.8(L) 9.1  Total Protein 6.5 - 8.1 g/dL 7.0 7.6 7.4  Total Bilirubin 0.3 - 1.2 mg/dL <0.2(L) 0.3 <0.2(L)  Alkaline Phos 38 - 126 U/L 67 74 68  AST 15 - 41 U/L 12(L) 13(L) 12(L)  ALT 0 - 44 U/L <6 8 <6      RADIOGRAPHIC STUDIES: I have personally reviewed the radiological images as listed and agreed with the findings in the report. No results found.   ASSESSMENT & PLAN: TRANISE FORREST a [id number].o.femalewith   1. 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 Reactive Leukocytosis and intermittent reactive thrombocytosis -stable  4. Chronic Epistaxis, h/o recurrent GI Bleeding;[facility] 5. Right arm DVT, found during hospitalization 11/2018 - not a candidate for anticoagulation due to severe GI bleeding 6.  HTN, on amlodipine and HCTZ 7. Chronic left knee pain, followed by ortho   Disposition:  Ms. Mcbath appears stable. She continues weekly ferlicit, q2 week Avastin, and RBC transfusion as needed last on 5/7. She recently increased Amicar to TID. Given her h/o DVT, I recommend to take BID daily except TID on MWF and continue other regimen. Nosebleeds are mild and stable, no other signs of active bleeding today.   Labs reviewed Hgb 9.4, no RBCs today. Proceed with ferrlicit and avastin today as planned. Continue weekly ferrlicit, q2 weeks beva, and RBC as needed. F/up in 3 weeks.   All questions were answered. The patient knows to call the clinic with any problems, questions or concerns. No barriers to learning were detected.     Alla Feeling, NP 08/24/20

## 2020-08-23 ENCOUNTER — Other Ambulatory Visit: Payer: Self-pay | Admitting: Internal Medicine

## 2020-08-23 DIAGNOSIS — K922 Gastrointestinal hemorrhage, unspecified: Secondary | ICD-10-CM

## 2020-08-24 ENCOUNTER — Encounter: Payer: Self-pay | Admitting: Nurse Practitioner

## 2020-08-24 ENCOUNTER — Inpatient Hospital Stay: Payer: Medicaid Other

## 2020-08-24 ENCOUNTER — Other Ambulatory Visit: Payer: Self-pay

## 2020-08-24 ENCOUNTER — Inpatient Hospital Stay (HOSPITAL_BASED_OUTPATIENT_CLINIC_OR_DEPARTMENT_OTHER): Payer: Medicaid Other | Admitting: Nurse Practitioner

## 2020-08-24 VITALS — BP 142/73 | HR 71 | Temp 97.3°F | Resp 18 | Ht 66.0 in | Wt 253.1 lb

## 2020-08-24 VITALS — BP 148/70 | HR 79 | Temp 98.6°F | Resp 18

## 2020-08-24 DIAGNOSIS — D500 Iron deficiency anemia secondary to blood loss (chronic): Secondary | ICD-10-CM

## 2020-08-24 DIAGNOSIS — I780 Hereditary hemorrhagic telangiectasia: Secondary | ICD-10-CM

## 2020-08-24 DIAGNOSIS — R808 Other proteinuria: Secondary | ICD-10-CM

## 2020-08-24 DIAGNOSIS — Z5112 Encounter for antineoplastic immunotherapy: Secondary | ICD-10-CM | POA: Diagnosis not present

## 2020-08-24 DIAGNOSIS — Z95828 Presence of other vascular implants and grafts: Secondary | ICD-10-CM

## 2020-08-24 LAB — CBC WITH DIFFERENTIAL (CANCER CENTER ONLY)
Abs Immature Granulocytes: 0.05 10*3/uL (ref 0.00–0.07)
Basophils Absolute: 0 10*3/uL (ref 0.0–0.1)
Basophils Relative: 0 %
Eosinophils Absolute: 0.2 10*3/uL (ref 0.0–0.5)
Eosinophils Relative: 1 %
HCT: 32.2 % — ABNORMAL LOW (ref 36.0–46.0)
Hemoglobin: 9.4 g/dL — ABNORMAL LOW (ref 12.0–15.0)
Immature Granulocytes: 0 %
Lymphocytes Relative: 12 %
Lymphs Abs: 1.5 10*3/uL (ref 0.7–4.0)
MCH: 26.9 pg (ref 26.0–34.0)
MCHC: 29.2 g/dL — ABNORMAL LOW (ref 30.0–36.0)
MCV: 92 fL (ref 80.0–100.0)
Monocytes Absolute: 0.6 10*3/uL (ref 0.1–1.0)
Monocytes Relative: 5 %
Neutro Abs: 10.2 10*3/uL — ABNORMAL HIGH (ref 1.7–7.7)
Neutrophils Relative %: 82 %
Platelet Count: 267 10*3/uL (ref 150–400)
RBC: 3.5 MIL/uL — ABNORMAL LOW (ref 3.87–5.11)
RDW: 20.9 % — ABNORMAL HIGH (ref 11.5–15.5)
WBC Count: 12.5 10*3/uL — ABNORMAL HIGH (ref 4.0–10.5)
nRBC: 0 % (ref 0.0–0.2)

## 2020-08-24 LAB — TOTAL PROTEIN, URINE DIPSTICK: Protein, ur: 100 mg/dL — AB

## 2020-08-24 LAB — SAMPLE TO BLOOD BANK

## 2020-08-24 LAB — FERRITIN: Ferritin: 281 ng/mL (ref 11–307)

## 2020-08-24 MED ORDER — SODIUM CHLORIDE 0.9 % IV SOLN
5.0000 mg/kg | Freq: Once | INTRAVENOUS | Status: AC
  Administered 2020-08-24: 600 mg via INTRAVENOUS
  Filled 2020-08-24: qty 16

## 2020-08-24 MED ORDER — SODIUM CHLORIDE 0.9% FLUSH
10.0000 mL | INTRAVENOUS | Status: DC | PRN
  Administered 2020-08-24: 10 mL
  Filled 2020-08-24: qty 10

## 2020-08-24 MED ORDER — HEPARIN SOD (PORK) LOCK FLUSH 100 UNIT/ML IV SOLN
500.0000 [IU] | Freq: Once | INTRAVENOUS | Status: AC | PRN
  Administered 2020-08-24: 500 [IU]
  Filled 2020-08-24: qty 5

## 2020-08-24 MED ORDER — SODIUM CHLORIDE 0.9% FLUSH
10.0000 mL | Freq: Once | INTRAVENOUS | Status: AC
  Administered 2020-08-24: 10 mL
  Filled 2020-08-24: qty 10

## 2020-08-24 MED ORDER — SODIUM CHLORIDE 0.9 % IV SOLN
Freq: Once | INTRAVENOUS | Status: AC
  Filled 2020-08-24: qty 250

## 2020-08-24 MED ORDER — SODIUM CHLORIDE 0.9 % IV SOLN
250.0000 mg | Freq: Once | INTRAVENOUS | Status: AC
  Administered 2020-08-24: 250 mg via INTRAVENOUS
  Filled 2020-08-24: qty 20

## 2020-08-24 MED ORDER — ALPRAZOLAM 0.5 MG PO TABS: 0.5000 mg | ORAL_TABLET | Freq: Every evening | ORAL | 0 refills | Status: DC | PRN

## 2020-08-24 NOTE — Progress Notes (Signed)
Ok to proceed with avastin today for urine protein of 100 per Cira Rue, NP.

## 2020-08-24 NOTE — Patient Instructions (Signed)
[facility] ONCOLOG[facility]  Discharge Instructions: Thank you for choosing [facility] to provide your oncology and hematology care.   If you have a lab appointment with the [facility], please go directly to the [facility] and check in at the registration area.   Wear comfortable clothing and clothing appropriate for easy access to any Portacath or PICC line.   We strive to give you quality time with your provider. You may need to reschedule your appointment if you arrive late (15 or more minutes).  Arriving late affects you and other patients whose appointments are after yours.  Also, if you miss three or more appointments without notifying the office, you may be dismissed from the clinic at the provider's discretion.      For prescription refill requests, have your pharmacy contact our office and allow 72 hours for refills to be completed. Today you received the following chemotherapy and/or immunotherapy agents avastin You also received IV Iron.    To help prevent nausea and vomiting after your treatment, we encourage you to take your nausea medication as directed.  BELOW ARE SYMPTOMS THAT SHOULD BE REPORTED IMMEDIATELY: . *FEVER GREATER THAN 100.4 F (38 C) OR HIGHER . *CHILLS OR SWEATING . *NAUSEA AND VOMITING THAT IS NOT CONTROLLED WITH YOUR NAUSEA MEDICATION . *UNUSUAL SHORTNESS OF BREATH . *UNUSUAL BRUISING OR BLEEDING . *URINARY PROBLEMS (pain or burning when urinating, or frequent urination) . *BOWEL PROBLEMS (unusual diarrhea, constipation, pain near the anus) . TENDERNESS IN MOUTH AND THROAT WITH OR WITHOUT PRESENCE OF ULCERS (sore throat, sores in mouth, or a toothache) . UNUSUAL RASH, SWELLING OR PAIN  . UNUSUAL VAGINAL DISCHARGE OR ITCHING   Items with * indicate a potential emergency and should be followed up as soon as possible or go to the Emergency Department if any problems should occur.  Please show the CHEMOTHERAPY ALERT CARD or  IMMUNOTHERAPY ALERT CARD at check-in to the Emergency Department and triage nurse.  Should you have questions after your visit or need to cancel or reschedule your appointment, please contact [facility]  Dept: [phone]  and follow the prompts.  Office hours are 8:00 a.m. to 4:30 p.m. Monday - Friday. Please note that voicemails left after 4:00 p.m. may not be returned until the following business day.  We are closed weekends and major holidays. You have access to a nurse at all times for urgent questions. Please call the main number to the clinic Dept: [phone] and follow the prompts.   For any non-urgent questions, you may also contact your provider using MyChart. We now offer e-Visits for anyone 89 and older to request care online for non-urgent symptoms. For details visit [email or web address].   Also download the MyChart app! Go to the app store, search "MyChart", open the app, select [facility], and log in with your MyChart username and password.  Due to Covid, a mask is required upon entering the hospital/clinic. If you do not have a mask, one will be given to you upon arrival. For doctor visits, patients may have 1 support person aged 5 or older with them. For treatment visits, patients cannot have anyone with them due to current Covid guidelines and our immunocompromised population.

## 2020-08-25 ENCOUNTER — Telehealth: Payer: Self-pay | Admitting: Hematology

## 2020-08-25 NOTE — Telephone Encounter (Signed)
Left message with follow-up appointments per 5/12 los. Gave option to call back to reschedule if needed. 

## 2020-08-31 ENCOUNTER — Inpatient Hospital Stay: Payer: Medicaid Other

## 2020-08-31 ENCOUNTER — Other Ambulatory Visit: Payer: Medicaid Other

## 2020-08-31 ENCOUNTER — Ambulatory Visit: Payer: Medicaid Other

## 2020-09-02 ENCOUNTER — Emergency Department (HOSPITAL_COMMUNITY)
Admission: EM | Admit: 2020-09-02 | Discharge: 2020-09-02 | Disposition: A | Discharge status: Home / Self Care | Payer: Medicaid Other | Attending: Emergency Medicine | Admitting: Emergency Medicine

## 2020-09-02 ENCOUNTER — Other Ambulatory Visit: Payer: Self-pay

## 2020-09-02 ENCOUNTER — Encounter (HOSPITAL_COMMUNITY): Payer: Self-pay | Admitting: Emergency Medicine

## 2020-09-02 ENCOUNTER — Emergency Department (HOSPITAL_COMMUNITY): Payer: Medicaid Other

## 2020-09-02 DIAGNOSIS — Z5321 Procedure and treatment not carried out due to patient leaving prior to being seen by health care provider: Secondary | ICD-10-CM | POA: Insufficient documentation

## 2020-09-02 DIAGNOSIS — S99911A Unspecified injury of right ankle, initial encounter: Secondary | ICD-10-CM | POA: Diagnosis not present

## 2020-09-02 DIAGNOSIS — M79604 Pain in right leg: Secondary | ICD-10-CM | POA: Diagnosis not present

## 2020-09-02 DIAGNOSIS — I10 Essential (primary) hypertension: Secondary | ICD-10-CM | POA: Diagnosis not present

## 2020-09-02 DIAGNOSIS — M79605 Pain in left leg: Secondary | ICD-10-CM | POA: Diagnosis not present

## 2020-09-02 DIAGNOSIS — M19071 Primary osteoarthritis, right ankle and foot: Secondary | ICD-10-CM | POA: Diagnosis not present

## 2020-09-02 NOTE — ED Triage Notes (Signed)
Pt c/o bilateral lower leg pain x 1 week, denies fall. States a can fell on her right leg a few days ago. Denies new swelling.

## 2020-09-02 NOTE — ED Provider Notes (Signed)
Emergency Medicine Provider Triage Evaluation Note  Peggy House , a 60 y.o. female  was evaluated in triage.  Pt complains of bilateral leg pain.  She states that she has a history of chronic arthritis as had continuous bilateral knee pain for quite some time.  She states her brought her to the ER today is that last Sunday she dropped a can of vegetables onto her right foot and states it has been more painful to walk on since that time.  She denies any fall or other injuries or trauma.  No other significant symptoms.  She denies any numbness or tingling..  Review of Systems  Positive: Right foot and ankle pain, chronic bilateral knee pain Negative: Fevers, chills, nausea, vomiting  Physical Exam  BP (!) 168/99   Pulse 84   Temp 98.2 F (36.8 C) (Oral)   Resp 18   SpO2 100%  Gen:   Awake, no distress, does not appear uncomfortable, pleasant, able answer questions appropriate milligrams Resp:  Normal effort  MSK:   Moves extremities without difficulty no significant tenderness to palpation of bilateral knees.  Diffuse tenderness with no focal tenderness to palpation of bilateral shins or calves.  No unilateral or asymmetric calf swelling or tenderness.  There is tenderness palpation of the right lateral malleolus of the ankle and over the dorsum of the foot. Other:  Dorsalis pedis and posterior tibial pulses 3+ and symmetric good cap refill in toes.  Dry skin diffusely bilaterally.  Medical Decision Making  Medically screening exam initiated at 7:03 PM.  Appropriate orders placed.  Peggy House was informed that the remainder of the evaluation will be completed by another provider, this initial triage assessment does not replace that evaluation, and the importance of remaining in the ED until their evaluation is complete.  Patient is 60 year old female with history of chronic arthritis and joint pain.  Seems that what is brought her today to the ER is right ankle and foot  pain after dropping canned vegetable on this approximately 1 week ago.  She is well-appearing suspect that there are no fractures however given her concern we will obtain plain x-rays.  Provided some reassurance and waiting room.  She has good distal pulses doubt vascular emergency.   Peggy House, [place] 09/02/20 1906    Isla Pence, MD 09/02/20 1930

## 2020-09-04 ENCOUNTER — Telehealth: Payer: Self-pay

## 2020-09-04 NOTE — Telephone Encounter (Signed)
I left a message for this pt requesting a call back. We have a personal Care Services form that has been completed but we need to know where to send it or if the pt wants to pick it up.

## 2020-09-05 ENCOUNTER — Telehealth: Payer: Self-pay

## 2020-09-05 NOTE — Telephone Encounter (Signed)
At patients last appointment patient requested [place] form be completed by provider and faxed for her.  This nurse contacted patient for the fax information.  Patient provided fax number to [facility] [phone] and should be at the attention of Tyra.  No further questions or concerns from patient at this time.    Fax sent 09/05/2020 at 14:08.

## 2020-09-06 ENCOUNTER — Other Ambulatory Visit: Payer: Self-pay

## 2020-09-06 ENCOUNTER — Ambulatory Visit: Payer: Medicaid Other | Admitting: Behavioral Health

## 2020-09-06 DIAGNOSIS — F331 Major depressive disorder, recurrent, moderate: Secondary | ICD-10-CM

## 2020-09-06 DIAGNOSIS — F411 Generalized anxiety disorder: Secondary | ICD-10-CM
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
## 2020-09-07 ENCOUNTER — Other Ambulatory Visit: Payer: Self-pay | Admitting: Hematology

## 2020-09-07 ENCOUNTER — Inpatient Hospital Stay: Payer: Medicaid Other

## 2020-09-07 ENCOUNTER — Encounter: Payer: Self-pay | Admitting: Hematology

## 2020-09-07 ENCOUNTER — Other Ambulatory Visit: Payer: Medicaid Other

## 2020-09-07 ENCOUNTER — Ambulatory Visit: Payer: Medicaid Other

## 2020-09-07 VITALS — BP 132/78 | HR 85 | Temp 97.7°F | Resp 18

## 2020-09-07 DIAGNOSIS — Z95828 Presence of other vascular implants and grafts: Secondary | ICD-10-CM

## 2020-09-07 DIAGNOSIS — I780 Hereditary hemorrhagic telangiectasia: Secondary | ICD-10-CM

## 2020-09-07 DIAGNOSIS — R808 Other proteinuria: Secondary | ICD-10-CM

## 2020-09-07 DIAGNOSIS — D500 Iron deficiency anemia secondary to blood loss (chronic): Secondary | ICD-10-CM

## 2020-09-07 DIAGNOSIS — Z5112 Encounter for antineoplastic immunotherapy: Secondary | ICD-10-CM | POA: Diagnosis not present

## 2020-09-07 LAB — CBC WITH DIFFERENTIAL (CANCER CENTER ONLY)
Abs Immature Granulocytes: 0.05 10*3/uL (ref 0.00–0.07)
Basophils Absolute: 0 10*3/uL (ref 0.0–0.1)
Basophils Relative: 0 %
Eosinophils Absolute: 0.2 10*3/uL (ref 0.0–0.5)
Eosinophils Relative: 2 %
HCT: 27.7 % — ABNORMAL LOW (ref 36.0–46.0)
Hemoglobin: 8.1 g/dL — ABNORMAL LOW (ref 12.0–15.0)
Immature Granulocytes: 0 %
Lymphocytes Relative: 16 %
Lymphs Abs: 1.8 10*3/uL (ref 0.7–4.0)
MCH: 26.4 pg (ref 26.0–34.0)
MCHC: 29.2 g/dL — ABNORMAL LOW (ref 30.0–36.0)
MCV: 90.2 fL (ref 80.0–100.0)
Monocytes Absolute: 0.6 10*3/uL (ref 0.1–1.0)
Monocytes Relative: 5 %
Neutro Abs: 8.6 10*3/uL — ABNORMAL HIGH (ref 1.7–7.7)
Neutrophils Relative %: 77 %
Platelet Count: 312 10*3/uL (ref 150–400)
RBC: 3.07 MIL/uL — ABNORMAL LOW (ref 3.87–5.11)
RDW: 20.6 % — ABNORMAL HIGH (ref 11.5–15.5)
WBC Count: 11.2 10*3/uL — ABNORMAL HIGH (ref 4.0–10.5)
nRBC: 0 % (ref 0.0–0.2)

## 2020-09-07 LAB — IRON AND TIBC
Iron: 55 ug/dL (ref 41–142)
Saturation Ratios: 23 % (ref 21–57)
TIBC: 240 ug/dL (ref 236–444)
UIBC: 185 ug/dL (ref 120–384)

## 2020-09-07 LAB — FERRITIN: Ferritin: 186 ng/mL (ref 11–307)

## 2020-09-07 LAB — TOTAL PROTEIN, URINE DIPSTICK: Protein, ur: 300 mg/dL — AB

## 2020-09-07 LAB — SAMPLE TO BLOOD BANK

## 2020-09-07 MED ORDER — HEPARIN SOD (PORK) LOCK FLUSH 100 UNIT/ML IV SOLN
500.0000 [IU] | Freq: Once | INTRAVENOUS | Status: AC
Start: 2020-09-07 — End: 2020-09-07
  Administered 2020-09-07: 500 [IU]
  Filled 2020-09-07: qty 5

## 2020-09-07 MED ORDER — SODIUM CHLORIDE 0.9 % IV SOLN
INTRAVENOUS | Status: DC
  Filled 2020-09-07: qty 250

## 2020-09-07 MED ORDER — SODIUM CHLORIDE 0.9% FLUSH
10.0000 mL | Freq: Once | INTRAVENOUS | Status: AC
  Administered 2020-09-07: 10 mL
  Filled 2020-09-07: qty 10

## 2020-09-07 MED ORDER — SODIUM CHLORIDE 0.9 % IV SOLN
250.0000 mg | Freq: Once | INTRAVENOUS | Status: AC
  Administered 2020-09-07: 250 mg via INTRAVENOUS
  Filled 2020-09-07: qty 20

## 2020-09-07 NOTE — Progress Notes (Signed)
Per MD, no transfusion needed today. Patient will receive IV iron. Also per MD, hold bevacizumab d/t elevated urine protein.

## 2020-09-07 NOTE — Progress Notes (Signed)
Met with patient at registration to introduce myself as Arboriculturist and to offer available resources.  Discussed one-time $1000 Radio broadcast assistant to assist with personal expenses while going through treatment. Based on situation and need, patient approved for grant.  She has a copy of the approval letter and expense sheet along with the Outpatient pharmacy information. She received a gift card today from grant.   She has my card for any additional financial questions or concerns in green folder long with documents.

## 2020-09-07 NOTE — Telephone Encounter (Signed)
Called patient left voice message for patient to return call to [facility] @ [phone] / needing to know if she contacted Danae Chen with [facility] regarding her PCS denial. Waiting call back.

## 2020-09-07 NOTE — Patient Instructions (Addendum)
Sodium Ferric Gluconate Complex injection What is this medicine? SODIUM FERRIC GLUCONATE COMPLEX (SOE dee um FER ik GLOO koe nate KOM pleks) is an iron replacement. It is used with epoetin therapy to treat low iron levels in patients who are receiving hemodialysis. This medicine may be used for other purposes; ask your health care provider or pharmacist if you have questions. COMMON BRAND NAME(S): Ferrlecit, Nulecit What should I tell my health care provider before I take this medicine? They need to know if you have any of the following conditions:  anemia that is not from iron deficiency  high levels of iron in the body  an unusual or allergic reaction to iron, benzyl alcohol, other medicines, foods, dyes, or preservatives  pregnant or are trying to become pregnant  breast-feeding How should I use this medicine? This medicine is for infusion into a vein. It is given by a health care professional in a hospital or clinic setting. Talk to your pediatrician regarding the use of this medicine in children. While this drug may be prescribed for children as young as 6 years old for selected conditions, precautions do apply. Overdosage: If you think you have taken too much of this medicine contact a poison control center or emergency room at once. NOTE: This medicine is only for you. Do not share this medicine with others. What if I miss a dose? It is important not to miss your dose. Call your doctor or health care professional if you are unable to keep an appointment. What may interact with this medicine? Do not take this medicine with any of the following medications:  deferoxamine  dimercaprol  other iron products This medicine may also interact with the following medications:  chloramphenicol  deferasirox  medicine for blood pressure like enalapril This list may not describe all possible interactions. Give your health care provider a list of all the medicines, herbs,  non-prescription drugs, or dietary supplements you use. Also tell them if you smoke, drink alcohol, or use illegal drugs. Some items may interact with your medicine. What should I watch for while using this medicine? Your condition will be monitored carefully while you are receiving this medicine. Visit your doctor for check-ups as directed. What side effects may I notice from receiving this medicine? Side effects that you should report to your doctor or health care professional as soon as possible:  allergic reactions like skin rash, itching or hives, swelling of the face, lips, or tongue  breathing problems  changes in hearing  changes in vision  chills, flushing, or sweating  fast, irregular heartbeat  feeling faint or lightheaded, falls  fever, flu-like symptoms  high or low blood pressure  pain, tingling, numbness in the hands or feet  severe pain in the chest, back, flanks, or groin  swelling of the ankles, feet, hands  trouble passing urine or change in the amount of urine  unusually weak or tired Side effects that usually do not require medical attention (report to your doctor or health care professional if they continue or are bothersome):  cramps  dark colored stools  diarrhea  headache  nausea, vomiting  stomach upset This list may not describe all possible side effects. Call your doctor for medical advice about side effects. You may report side effects to FDA at 1-800-FDA-1088. Where should I keep my medicine? This drug is given in a hospital or clinic and will not be stored at home. NOTE: This sheet is a summary. It may not cover all   possible information. If you have questions about this medicine, talk to your doctor, pharmacist, or health care provider.  2021 Elsevier/Gold Standard (2007-12-02 15:58:57)   

## 2020-09-08 ENCOUNTER — Other Ambulatory Visit: Payer: Self-pay

## 2020-09-08 ENCOUNTER — Telehealth: Payer: Self-pay | Admitting: Hematology

## 2020-09-08 DIAGNOSIS — D500 Iron deficiency anemia secondary to blood loss (chronic): Secondary | ICD-10-CM

## 2020-09-08 NOTE — Telephone Encounter (Signed)
Blood transfusion added to appt per 5/26 sch msg. Moved appt to next day due to length increase and moved appt to [facility] location. Called pt and left msg with updateds appts times/days/locations.

## 2020-09-09 ENCOUNTER — Encounter: Payer: Self-pay | Admitting: *Deleted

## 2020-09-13 ENCOUNTER — Other Ambulatory Visit: Payer: Self-pay | Admitting: Internal Medicine

## 2020-09-13 ENCOUNTER — Encounter (HOSPITAL_COMMUNITY): Payer: Self-pay

## 2020-09-13 ENCOUNTER — Other Ambulatory Visit: Payer: Self-pay | Admitting: Nurse Practitioner

## 2020-09-13 ENCOUNTER — Other Ambulatory Visit: Payer: Self-pay

## 2020-09-13 ENCOUNTER — Emergency Department (HOSPITAL_COMMUNITY)
Admission: EM | Admit: 2020-09-13 | Discharge: 2020-09-14 | Disposition: A | Discharge status: Home / Self Care | Payer: Medicaid Other | Attending: Emergency Medicine | Admitting: Emergency Medicine

## 2020-09-13 DIAGNOSIS — K429 Umbilical hernia without obstruction or gangrene: Secondary | ICD-10-CM | POA: Diagnosis not present

## 2020-09-13 DIAGNOSIS — R1110 Vomiting, unspecified: Secondary | ICD-10-CM | POA: Diagnosis not present

## 2020-09-13 DIAGNOSIS — R509 Fever, unspecified: Secondary | ICD-10-CM | POA: Diagnosis not present

## 2020-09-13 DIAGNOSIS — Z7984 Long term (current) use of oral hypoglycemic drugs: Secondary | ICD-10-CM | POA: Diagnosis not present

## 2020-09-13 DIAGNOSIS — E119 Type 2 diabetes mellitus without complications: Secondary | ICD-10-CM | POA: Diagnosis not present

## 2020-09-13 DIAGNOSIS — N3000 Acute cystitis without hematuria: Secondary | ICD-10-CM | POA: Diagnosis not present

## 2020-09-13 DIAGNOSIS — R112 Nausea with vomiting, unspecified: Secondary | ICD-10-CM

## 2020-09-13 DIAGNOSIS — Z20822 Contact with and (suspected) exposure to covid-19: Secondary | ICD-10-CM | POA: Diagnosis not present

## 2020-09-13 DIAGNOSIS — J9811 Atelectasis: Secondary | ICD-10-CM | POA: Diagnosis not present

## 2020-09-13 DIAGNOSIS — R1111 Vomiting without nausea: Secondary | ICD-10-CM | POA: Diagnosis not present

## 2020-09-13 DIAGNOSIS — I10 Essential (primary) hypertension: Secondary | ICD-10-CM | POA: Insufficient documentation

## 2020-09-13 DIAGNOSIS — B9689 Other specified bacterial agents as the cause of diseases classified elsewhere: Secondary | ICD-10-CM | POA: Diagnosis not present

## 2020-09-13 DIAGNOSIS — F331 Major depressive disorder, recurrent, moderate: Secondary | ICD-10-CM

## 2020-09-13 DIAGNOSIS — Z79899 Other long term (current) drug therapy: Secondary | ICD-10-CM | POA: Diagnosis not present

## 2020-09-13 DIAGNOSIS — R040 Epistaxis: Secondary | ICD-10-CM | POA: Diagnosis not present

## 2020-09-13 DIAGNOSIS — D179 Benign lipomatous neoplasm, unspecified: Secondary | ICD-10-CM | POA: Diagnosis not present

## 2020-09-13 DIAGNOSIS — I517 Cardiomegaly: Secondary | ICD-10-CM | POA: Diagnosis not present

## 2020-09-13 DIAGNOSIS — F17210 Nicotine dependence, cigarettes, uncomplicated: Secondary | ICD-10-CM | POA: Insufficient documentation

## 2020-09-13 DIAGNOSIS — R110 Nausea: Secondary | ICD-10-CM | POA: Diagnosis not present

## 2020-09-13 LAB — CBC WITH DIFFERENTIAL/PLATELET
Abs Immature Granulocytes: 0 10*3/uL (ref 0.00–0.07)
Basophils Absolute: 0 10*3/uL (ref 0.0–0.1)
Basophils Relative: 0 %
Eosinophils Absolute: 0 10*3/uL (ref 0.0–0.5)
Eosinophils Relative: 0 %
HCT: 29.7 % — ABNORMAL LOW (ref 36.0–46.0)
Hemoglobin: 8.6 g/dL — ABNORMAL LOW (ref 12.0–15.0)
Lymphocytes Relative: 0 %
Lymphs Abs: 0 10*3/uL — ABNORMAL LOW (ref 0.7–4.0)
MCH: 27 pg (ref 26.0–34.0)
MCHC: 29 g/dL — ABNORMAL LOW (ref 30.0–36.0)
MCV: 93.1 fL (ref 80.0–100.0)
Monocytes Absolute: 0.2 10*3/uL (ref 0.1–1.0)
Monocytes Relative: 1 %
Neutro Abs: 20.1 10*3/uL — ABNORMAL HIGH (ref 1.7–7.7)
Neutrophils Relative %: 99 %
Platelets: 353 10*3/uL (ref 150–400)
RBC: 3.19 MIL/uL — ABNORMAL LOW (ref 3.87–5.11)
RDW: 21.8 % — ABNORMAL HIGH (ref 11.5–15.5)
WBC: 20.3 10*3/uL — ABNORMAL HIGH (ref 4.0–10.5)
nRBC: 0 /100 WBC
nRBC: 0.2 % (ref 0.0–0.2)

## 2020-09-13 LAB — URINALYSIS, ROUTINE W REFLEX MICROSCOPIC
Bacteria, UA: NONE SEEN
Bilirubin Urine: NEGATIVE
Glucose, UA: NEGATIVE mg/dL
Hgb urine dipstick: NEGATIVE
Ketones, ur: NEGATIVE mg/dL
Nitrite: POSITIVE — AB
Protein, ur: 100 mg/dL — AB
Specific Gravity, Urine: 1.012 (ref 1.005–1.030)
WBC, UA: >50 WBC/hpf — ABNORMAL HIGH (ref 0–5)
pH: 6 (ref 5.0–8.0)

## 2020-09-13 LAB — COMPREHENSIVE METABOLIC PANEL
ALT: 11 U/L (ref 0–44)
AST: 19 U/L (ref 15–41)
Albumin: 3.4 g/dL — ABNORMAL LOW (ref 3.5–5.0)
Alkaline Phosphatase: 70 U/L (ref 38–126)
Anion gap: 12 (ref 5–15)
BUN: 11 mg/dL (ref 6–20)
CO2: 23 mmol/L (ref 22–32)
Calcium: 9.3 mg/dL (ref 8.9–10.3)
Chloride: 104 mmol/L (ref 98–111)
Creatinine, Ser: 1.03 mg/dL — ABNORMAL HIGH (ref 0.44–1.00)
GFR, Estimated: >60 mL/min (ref >60)
Glucose, Bld: 159 mg/dL — ABNORMAL HIGH (ref 70–99)
Potassium: 3.4 mmol/L — ABNORMAL LOW (ref 3.5–5.1)
Sodium: 139 mmol/L (ref 135–145)
Total Bilirubin: 0.6 mg/dL (ref 0.3–1.2)
Total Protein: 7.5 g/dL (ref 6.5–8.1)

## 2020-09-13 LAB — LIPASE, BLOOD: Lipase: 24 U/L (ref 11–51)

## 2020-09-13 LAB — I-STAT BETA HCG BLOOD, ED (MC, WL, AP ONLY): I-stat hCG, quantitative: <5 m[IU]/mL (ref <5)

## 2020-09-13 NOTE — ED Triage Notes (Signed)
Brought in by [facility] from home - c/o nausea and vomiting all day. On the way here, pt had episode of nose bleeding and vomited blood. Hx of HHT.   BP 210/90 initially then 126/90. HR 98. NO IV.   Tylenol 1000mg  given by EMS. Pt took her own tylenol . bgl 130

## 2020-09-13 NOTE — ED Provider Notes (Signed)
Emergency Medicine Provider Triage Evaluation Note  Peggy House , a 60 y.o. female  was evaluated in triage.  Pt complains of nausea and vomiting.  Reports that symptoms started today.  Had been having some abdominal pain earlier today, but not anymore.  Denies fever, chills, or diarrhea.  Was given Tylenol by EMS.  Review of Systems  Positive: Vomiting, nausea Negative: Diarrhea, fever  Physical Exam  Ht 5\' 6"  (1.676 m)   Wt 114.8 kg   BMI 40.85 kg/m  Gen:   Awake, no distress   Resp:  Normal effort  MSK:   Moves extremities without difficulty  Other:  Abdomen is soft with minimal discomfort  Medical Decision Making  Medically screening exam initiated at 10:34 PM.  Appropriate orders placed.  Peggy House was informed that the remainder of the evaluation will be completed by another provider, this initial triage assessment does not replace that evaluation, and the importance of remaining in the ED until their evaluation is complete.     Montine Circle, PA-C 09/13/20 2235    Lorelle Gibbs, DO 09/13/20 2331

## 2020-09-14 ENCOUNTER — Emergency Department (HOSPITAL_COMMUNITY): Payer: Medicaid Other

## 2020-09-14 ENCOUNTER — Telehealth: Payer: Self-pay

## 2020-09-14 ENCOUNTER — Ambulatory Visit: Payer: Medicaid Other

## 2020-09-14 ENCOUNTER — Encounter: Payer: Self-pay | Admitting: Hematology

## 2020-09-14 ENCOUNTER — Other Ambulatory Visit: Payer: Medicaid Other

## 2020-09-14 ENCOUNTER — Other Ambulatory Visit: Payer: Self-pay | Admitting: Hematology

## 2020-09-14 ENCOUNTER — Ambulatory Visit: Payer: Medicaid Other | Admitting: Hematology

## 2020-09-14 DIAGNOSIS — R1111 Vomiting without nausea: Secondary | ICD-10-CM | POA: Diagnosis not present

## 2020-09-14 DIAGNOSIS — R040 Epistaxis: Secondary | ICD-10-CM | POA: Diagnosis not present

## 2020-09-14 DIAGNOSIS — J9811 Atelectasis: Secondary | ICD-10-CM | POA: Diagnosis not present

## 2020-09-14 DIAGNOSIS — I517 Cardiomegaly: Secondary | ICD-10-CM | POA: Diagnosis not present

## 2020-09-14 DIAGNOSIS — K429 Umbilical hernia without obstruction or gangrene: Secondary | ICD-10-CM | POA: Diagnosis not present

## 2020-09-14 DIAGNOSIS — D500 Iron deficiency anemia secondary to blood loss (chronic): Secondary | ICD-10-CM

## 2020-09-14 DIAGNOSIS — D179 Benign lipomatous neoplasm, unspecified: Secondary | ICD-10-CM | POA: Diagnosis not present

## 2020-09-14 DIAGNOSIS — R1110 Vomiting, unspecified: Secondary | ICD-10-CM | POA: Diagnosis not present

## 2020-09-14 DIAGNOSIS — I780 Hereditary hemorrhagic telangiectasia: Secondary | ICD-10-CM

## 2020-09-14 DIAGNOSIS — R110 Nausea: Secondary | ICD-10-CM | POA: Diagnosis not present

## 2020-09-14 LAB — RESP PANEL BY RT-PCR (FLU A&B, COVID) ARPGX2
Influenza A by PCR: NEGATIVE
Influenza B by PCR: NEGATIVE
SARS Coronavirus 2 by RT PCR: NEGATIVE

## 2020-09-14 LAB — LACTIC ACID, PLASMA: Lactic Acid, Venous: 1 mmol/L (ref 0.5–1.9)

## 2020-09-14 MED ORDER — CEPHALEXIN 500 MG PO CAPS: 500.0000 mg | ORAL_CAPSULE | Freq: Four times a day (QID) | ORAL | 0 refills | Status: DC

## 2020-09-14 MED ORDER — LACTATED RINGERS IV SOLN: INTRAVENOUS | Status: DC

## 2020-09-14 MED ORDER — SODIUM CHLORIDE 0.9 % IV SOLN
1.0000 g | INTRAVENOUS | Status: DC
  Administered 2020-09-14: 1 g via INTRAVENOUS
  Filled 2020-09-14: qty 10

## 2020-09-14 MED ORDER — ALPRAZOLAM 0.5 MG PO TABS
0.5000 mg | ORAL_TABLET | Freq: Every evening | ORAL | 0 refills | Status: DC | PRN
Start: 2020-09-14 — End: 2020-10-20

## 2020-09-14 MED ORDER — ONDANSETRON HCL 4 MG/2ML IJ SOLN
4.0000 mg | Freq: Once | INTRAMUSCULAR | Status: AC
  Administered 2020-09-14: 4 mg via INTRAVENOUS
  Filled 2020-09-14: qty 2

## 2020-09-14 MED ORDER — ONDANSETRON 8 MG PO TBDP: ORAL_TABLET | ORAL | 0 refills | Status: DC

## 2020-09-14 NOTE — Telephone Encounter (Signed)
[name] called stating she was in the ED over the night for a UTI.  She cancelled her appts for today.  Review of hgb 8.6 pt does not need transfused tomorrow, appt cancelled for today.  Reschedule for Saturday 6/4.  I left a message with MS Isley regarding these scheduling changes.  I asked her to return my call to confirm.

## 2020-09-14 NOTE — ED Provider Notes (Signed)
[facility] EMERGENCY DEPARTMENT Provider Note   CSN: [id number] Arrival date & time: 09/13/20  2149     History Chief Complaint  Patient presents with  . Emesis  . Nausea    Peggy House is a 60 y.o. female.   Emesis Severity:  Moderate Duration:  2 days Timing:  Constant Quality:  Stomach contents Progression:  Unchanged Chronicity:  Recurrent Recent urination:  Normal Relieved by:  Nothing Worsened by:  Nothing Ineffective treatments:  None tried Associated symptoms: no cough and no fever        Past Medical History:  Diagnosis Date  . Anxiety   . Arthritis    knnes,back  . GERD (gastroesophageal reflux disease)   . 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 History of swelling of feet   . Hyperlipidemia   . Hypertension   . Major depressive disorder, recurrent episode ([place]) 06/05/2015  . Obesity   . Snores   . 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 Trapezius muscle spasm 07/22/2020  . GIB (gastrointestinal bleeding) 03/01/2020  . Leg pain, bilateral 09/28/2019  . Urge incontinence 09/28/2019  . Pain due to onychomycosis of toenails of both feet 02/26/2019  . 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 Anemia due to GI blood loss 12/07/2018  . 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 Acute deep vein thrombosis (DVT) of right upper extremity ([place]) 12/07/2018  . Hypokalemia   . Port-A-Cath in place 09/18/2018  . GAD (generalized anxiety disorder) 01/20/2018  . Need for influenza vaccination 01/20/2018  . Upper GI bleed 10/25/2017  . Pulmonary artery hypertension ([place]) 10/19/2017  . Symptomatic anemia 05/20/2017  . 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 Venous stasis dermatitis of both lower extremities 05/07/2017  . Recurrent epistaxis 11/29/2016  . Iron  deficiency anemia 10/29/2016  . Cough 05/13/2016  . Osteoarthritis, multiple sites 06/05/2015  . Insomnia 06/05/2015  . Major depressive disorder, recurrent episode ([place]) 06/05/2015  . Obesity, Class III, BMI 40-49.9 (morbid obesity) ([place]) 04/26/2014  . GI bleed 04/25/2014  . HLD (hyperlipidemia) 04/25/2014  . Leg swelling 04/25/2014  . Essential hypertension   . Acute on chronic blood loss anemia 04/07/2014    Past Surgical History:  Procedure Laterality Date  . ABDOMINAL HYSTERECTOMY    . CARPAL TUNNEL RELEASE  05/13/2011   Procedure: CARPAL TUNNEL RELEASE;  Surgeon: Nita Sells, MD;  Location: [place];  Service: Orthopedics;  Laterality: Left;  . COLONOSCOPY N/A 03/02/2020   Procedure: COLONOSCOPY;  Surgeon: Ronald Lobo, MD;  Location: [facility] ENDOSCOPY;  Service: Endoscopy;  Laterality: N/A;  . COLONOSCOPY WITH PROPOFOL N/A 04/28/2014   Procedure: COLONOSCOPY WITH PROPOFOL;  Surgeon: Cleotis Nipper, MD;  Location: [facility] ENDOSCOPY;  Service: Endoscopy;  Laterality: N/A;  . DG TOES*L*  2/10   rt  . DILATION AND CURETTAGE OF UTERUS    . ENTEROSCOPY N/A 10/17/2017   Procedure: ENTEROSCOPY;  Surgeon: Otis Brace, MD;  Location: [place] ENDOSCOPY;  Service: Gastroenterology;  Laterality: N/A;  . ESOPHAGOGASTRODUODENOSCOPY N/A 04/10/2014   Procedure: ESOPHAGOGASTRODUODENOSCOPY (EGD);  Surgeon: [name], MD;  Location: [facility] ENDOSCOPY;  Service: Endoscopy;  Laterality: N/A;  . ESOPHAGOGASTRODUODENOSCOPY N/A 05/10/2017   Procedure: ESOPHAGOGASTRODUODENOSCOPY (EGD);  Surgeon: Ronald Lobo, MD;  Location: [facility] ENDOSCOPY;  Service: Endoscopy;  Laterality: N/A;  . ESOPHAGOGASTRODUODENOSCOPY N/A 09/22/2017  Procedure: ESOPHAGOGASTRODUODENOSCOPY (EGD);  Surgeon: Clarene Essex, MD;  Location: [place];  Service: Endoscopy;  Laterality: N/A;  bedside  . ESOPHAGOGASTRODUODENOSCOPY N/A 03/02/2020   Procedure: ESOPHAGOGASTRODUODENOSCOPY (EGD);  Surgeon: Ronald Lobo, MD;  Location: [facility];  Service: Endoscopy;  Laterality: N/A;  . ESOPHAGOGASTRODUODENOSCOPY (EGD) WITH PROPOFOL N/A 04/27/2014   Procedure: ESOPHAGOGASTRODUODENOSCOPY (EGD) WITH PROPOFOL;  Surgeon: Cleotis Nipper, MD;  Location: [place];  Service: Endoscopy;  Laterality: N/A;  possible apc  . ESOPHAGOGASTRODUODENOSCOPY (EGD) WITH PROPOFOL N/A 09/30/2017   Procedure: ESOPHAGOGASTRODUODENOSCOPY (EGD) WITH PROPOFOL;  Surgeon: [name], MD;  Location: [place];  Service: Gastroenterology;  Laterality: N/A;  . ESOPHAGOGASTRODUODENOSCOPY (EGD) WITH PROPOFOL N/A 10/01/2017   Procedure: ESOPHAGOGASTRODUODENOSCOPY (EGD) WITH PROPOFOL;  Surgeon: [name], MD;  Location: [place];  Service: Gastroenterology;  Laterality: N/A;  . ESOPHAGOGASTRODUODENOSCOPY (EGD) WITH PROPOFOL N/A 10/08/2017   Procedure: ESOPHAGOGASTRODUODENOSCOPY (EGD) WITH PROPOFOL;  Surgeon: Otis Brace, MD;  Location: [facility] ENDOSCOPY;  Service: Gastroenterology;  Laterality: N/A;  . ESOPHAGOGASTRODUODENOSCOPY (EGD) WITH PROPOFOL N/A 10/17/2017   Procedure: ESOPHAGOGASTRODUODENOSCOPY (EGD) WITH PROPOFOL;  Surgeon: Otis Brace, MD;  Location: [facility] ENDOSCOPY;  Service: Gastroenterology;  Laterality: N/A;  . ESOPHAGOGASTRODUODENOSCOPY (EGD) WITH PROPOFOL N/A 10/19/2017   Procedure: ESOPHAGOGASTRODUODENOSCOPY (EGD) WITH PROPOFOL;  Surgeon: Otis Brace, MD;  Location: [facility] ENDOSCOPY;  Service: Gastroenterology;  Laterality: N/A;  . ESOPHAGOGASTRODUODENOSCOPY (EGD) WITH PROPOFOL N/A 12/04/2018   Procedure: ESOPHAGOGASTRODUODENOSCOPY (EGD) WITH PROPOFOL;  Surgeon: Wilford Corner, MD;  Location: [facility] ENDOSCOPY;  Service: Endoscopy;  Laterality: N/A;  . GIVENS CAPSULE STUDY N/A 10/02/2017   Procedure: GIVENS CAPSULE STUDY;  Surgeon: [name], MD;  Location: [place];  Service: Gastroenterology;  Laterality: N/A;  . GIVENS CAPSULE STUDY N/A 10/08/2017   Procedure: GIVENS CAPSULE STUDY;  Surgeon: Otis Brace, MD;  Location: [place];  Service: Gastroenterology;  Laterality: N/A;  endoscopic placement of capsule  . GIVENS CAPSULE STUDY N/A 03/02/2020   Procedure: GIVENS CAPSULE STUDY;  Surgeon: Ronald Lobo, MD;  Location: [facility] ENDOSCOPY;  Service: Endoscopy;  Laterality: N/A;  . HEMOSTASIS CLIP PLACEMENT  12/04/2018   Procedure: HEMOSTASIS CLIP PLACEMENT;  Surgeon: Wilford Corner, MD;  Location: [facility] ENDOSCOPY;  Service: Endoscopy;;  . HOT HEMOSTASIS N/A 04/27/2014   Procedure: HOT HEMOSTASIS (ARGON PLASMA COAGULATION/BICAP);  Surgeon: Cleotis Nipper, MD;  Location: [facility] ENDOSCOPY;  Service: Endoscopy;  Laterality: N/A;  . HOT HEMOSTASIS N/A 09/30/2017   Procedure: HOT HEMOSTASIS (ARGON PLASMA COAGULATION/BICAP);  Surgeon: [name], MD;  Location: [place];  Service: Gastroenterology;  Laterality: N/A;  . HOT HEMOSTASIS N/A 10/01/2017   Procedure: HOT HEMOSTASIS (ARGON PLASMA COAGULATION/BICAP);  Surgeon: [name], MD;  Location: [place];  Service: Gastroenterology;  Laterality: N/A;  . HOT HEMOSTASIS N/A 10/17/2017   Procedure: HOT HEMOSTASIS (ARGON PLASMA COAGULATION/BICAP);  Surgeon: Otis Brace, MD;  Location: [facility] ENDOSCOPY;  Service: Gastroenterology;  Laterality: N/A;  . HOT HEMOSTASIS N/A 10/19/2017   Procedure: HOT HEMOSTASIS (ARGON PLASMA COAGULATION/BICAP);  Surgeon: Otis Brace, MD;  Location: [facility] ENDOSCOPY;  Service: Gastroenterology;  Laterality: N/A;  . HOT HEMOSTASIS N/A 03/02/2020   Procedure: HOT HEMOSTASIS (ARGON PLASMA COAGULATION/BICAP);  Surgeon: Ronald Lobo, MD;  Location: [facility];  Service: Endoscopy;  Laterality: N/A;  . IR IMAGING GUIDED PORT INSERTION  07/08/2018  . L shoulder Surgery  2011  . POLYPECTOMY  03/02/2020   Procedure: POLYPECTOMY;  Surgeon: Ronald Lobo, MD;  Location: [facility] ENDOSCOPY;  Service: Endoscopy;;  . SUBMUCOSAL INJECTION  09/22/2017   Procedure: SUBMUCOSAL INJECTION;  Surgeon:  Clarene Essex, MD;  [facility]  ENDOSCOPY;  Service: Endoscopy;;  . SUBMUCOSAL INJECTION  12/04/2018   Procedure: SUBMUCOSAL INJECTION;  Surgeon: Wilford Corner, MD;  Location: [facility] ENDOSCOPY;  Service: Endoscopy;;     OB History   No obstetric history on file.     Family History  Problem Relation Age of Onset  . Cancer Mother        Ovarian  . Ovarian cancer Mother   . Diabetes Mother   . Kidney disease Mother   . Bleeding Disorder Mother   . Diabetes type II Sister   . Bleeding Disorder Sister   . Diabetes Sister   . Colon cancer Maternal Grandfather   . Crohn's disease Maternal Grandfather   . Stomach cancer Maternal Grandmother   . Kidney disease Son   . Bleeding Disorder Son   . Dysmenorrhea Neg Hx     Social History   Tobacco Use  . Smoking status: Current Every Day Smoker    Packs/day: 0.50    Years: 30.00    Pack years: 15.00    Types: Cigarettes  . Smokeless tobacco: Former Systems developer    Types: Snuff    Quit date: 80  . Tobacco comment: 1/2 PPD  Vaping Use  . Vaping Use: Never used  Substance Use Topics  . Alcohol use: No    Alcohol/week: 0.0 standard drinks  . Drug use: No    Comment: last cocaine-2010    Home Medications Prior to Admission medications   Medication Sig Start Date End Date Taking? Authorizing Provider  cephALEXin (KEFLEX) 500 MG capsule Take 1 capsule (500 mg total) by mouth 4 (four) times daily. 09/14/20  Yes Raad Clayson, Corene Cornea, MD  ondansetron (ZOFRAN ODT) 8 MG disintegrating tablet 8mg  ODT q4 hours prn nausea 09/14/20  Yes Pearlee Arvizu, Corene Cornea, MD  Accu-Chek Softclix Lancets lancets Use to check blood sugar before breakfast and before dinner while on steroids 09/27/19   [name], MD  acitretin (SORIATANE) 10 MG capsule Take 10 mg by mouth daily. 01/03/20   [provider]  albuterol (VENTOLIN HFA) 108 (90 Base) MCG/ACT inhaler Inhale 1-2 puffs into the lungs every 6 (six) hours as needed for wheezing or shortness of breath. 07/07/20   Truitt Merle, MD  ALPRAZolam  Duanne Moron) 0.5 MG tablet Take 1 tablet (0.5 mg total) by mouth at bedtime as needed for anxiety. 09/14/20   Truitt Merle, MD  Aminocaproic Acid 1000 MG TABS Take 1 tablet (1,000 mg total) by mouth in the morning and at bedtime. TAKE 1 TABLET(1000 MG) BY MOUTH TWICE DAILY 05/13/20   Truitt Merle, MD  amLODipine (NORVASC) 10 MG tablet TAKE 1 TABLET(10 MG) BY MOUTH DAILY 08/16/20   Alla Feeling, NP  atorvastatin (LIPITOR) 80 MG tablet TAKE 1 TABLET(80 MG) BY MOUTH DAILY Patient taking differently: Take 80 mg by mouth daily. TAKE 1 TABLET(80 MG) BY MOUTH DAILY 12/16/19   Truitt Merle, MD  cetirizine (ZYRTEC) 10 MG tablet TAKE 1 TABLET(10 MG) BY MOUTH DAILY 03/14/20   [name], MD  diphenoxylate-atropine (LOMOTIL) 2.5-0.025 MG tablet 1 to 2 PO QID prn diarrhea Patient taking differently: Take 1-2 tablets by mouth 4 (four) times daily as needed for diarrhea or loose stools. 04/15/19   Tanner, Lyndon Code., PA-C  ferrous gluconate (FERGON) 324 MG tablet 1 tablet    [provider]  furosemide (LASIX) 40 MG tablet 1 tablet    [provider]  glucose blood (ACCU-CHEK GUIDE) test strip Check  blood sugar 2 times per day while on steroids before breakfast and dinner 09/27/19   [name], MD  hydrochlorothiazide (HYDRODIURIL) 12.5 MG tablet TAKE 1 TABLET(12.5 MG) BY MOUTH DAILY 05/25/20   Cato Mulligan, MD  lidocaine-prilocaine (EMLA) cream Apply 1 application topically as needed. 07/17/18   Truitt Merle, MD  metFORMIN (GLUCOPHAGE) 500 MG tablet Take 1 tablet (500 mg total) by mouth 2 (two) times daily with a meal. 10/06/19   [name], MD  metoprolol succinate (TOPROL-XL) 100 MG 24 hr tablet 1 tablet    [provider]  pantoprazole (PROTONIX) 40 MG tablet TAKE 1 TABLET(40 MG) BY MOUTH TWICE DAILY 08/24/20   [name], MD  Podiatric Products (FLEXITOL HEEL BALM) OINT Apply 1 application topically as needed (dry skin).  06/10/19   [provider]  potassium chloride  (KLOR-CON) 20 MEQ packet Take 20 mEq by mouth daily. 05/23/20   Truitt Merle, MD  prochlorperazine (COMPAZINE) 10 MG tablet Take 1 tablet (10 mg total) by mouth every 6 (six) hours as needed for nausea or vomiting. 10/14/18   Alla Feeling, NP  sertraline (ZOLOFT) 100 MG tablet TAKE 1 TABLET(100 MG) BY MOUTH DAILY Patient taking differently: Take 100 mg by mouth daily. 02/02/20   Marianna Payment, MD  sertraline (ZOLOFT) 25 MG tablet Take 1 tablet (25 mg total) by mouth daily. 07/22/20 10/20/20  Marianna Payment, MD  terbinafine (LAMISIL AT) 1 % cream Apply 1 application topically 2 (two) times daily. Both bottom and top of both feet and toes 06/16/20   Seawell, Jaimie A, DO  traZODone (DESYREL) 100 MG tablet TAKE 1 TABLET(100 MG) BY MOUTH AT BEDTIME 09/14/20   Cato Mulligan, MD    Allergies    Feraheme [ferumoxytol], Nsaids, Tomato, Iron (ferrous sulfate) [ferrous sulfate er], Other, and Wasp venom  Review of Systems   Review of Systems  Constitutional: Negative for fever.  Respiratory: Negative for cough.   Gastrointestinal: Positive for vomiting.  All other systems reviewed and are negative.   Physical Exam Updated Vital Signs BP 121/66   Pulse 75   Temp 98.9 F (37.2 C) (Oral)   Resp 16   Ht 5\' 6"  (1.676 m)   Wt 114.8 kg   SpO2 100%   BMI 40.85 kg/m   Physical Exam Vitals and nursing note reviewed.  Constitutional:      Appearance: She is well-developed.  HENT:     Head: Normocephalic and atraumatic.     Nose: No congestion or rhinorrhea.     Mouth/Throat:     Mouth: Mucous membranes are moist.     Pharynx: Oropharynx is clear.  Eyes:     Pupils: Pupils are equal, round, and reactive to light.  Cardiovascular:     Rate and Rhythm: Normal rate and regular rhythm.  Pulmonary:     Effort: No respiratory distress.     Breath sounds: No stridor.  Abdominal:     General: Abdomen is flat. There is no distension.  Musculoskeletal:        General: No swelling or tenderness. Normal  range of motion.     Cervical back: Normal range of motion.  Skin:    General: Skin is warm and dry.     Coloration: Skin is not jaundiced or pale.  Neurological:     General: No focal deficit present.     Mental Status: She is alert.     ED Results / Procedures / Treatments   Labs (  all labs ordered are listed, but only abnormal results are displayed) Labs Reviewed  COMPREHENSIVE METABOLIC PANEL - Abnormal; Notable for the following components:      Result Value   Potassium 3.4 (*)    Glucose, Bld 159 (*)    Creatinine, Ser 1.03 (*)    Albumin 3.4 (*)    All other components within normal limits  CBC WITH DIFFERENTIAL/PLATELET - Abnormal; Notable for the following components:   WBC 20.3 (*)    RBC 3.19 (*)    Hemoglobin 8.6 (*)    HCT 29.7 (*)    MCHC 29.0 (*)    RDW 21.8 (*)    Neutro Abs 20.1 (*)    Lymphs Abs 0.0 (*)    All other components within normal limits  URINALYSIS, ROUTINE W REFLEX MICROSCOPIC - Abnormal; Notable for the following components:   APPearance CLOUDY (*)    Protein, ur 100 (*)    Nitrite POSITIVE (*)    Leukocytes,Ua LARGE (*)    WBC, UA >50 (*)    All other components within normal limits  RESP PANEL BY RT-PCR (FLU A&B, COVID) ARPGX2  CULTURE, BLOOD (SINGLE)  LIPASE, BLOOD  LACTIC ACID, PLASMA  I-STAT BETA HCG BLOOD, ED ([facility], [facility], [facility] ONLY)    EKG None  Radiology CT ABDOMEN PELVIS WO CONTRAST  Result Date: 09/14/2020 CLINICAL DATA:  60 year old female with nausea and vomiting. Subsequent epistaxis and vomiting blood. EXAM: CT ABDOMEN AND PELVIS WITHOUT CONTRAST TECHNIQUE: Multidetector CT imaging of the abdomen and pelvis was performed following the standard protocol without IV contrast. COMPARISON:  CT Abdomen and Pelvis 05/06/2016. Portable chest x-ray earlier today. FINDINGS: Lower chest: Port-A-Cath tip is visible. Cardiac size at the upper limits of normal. Curvilinear opacity in both costophrenic angles more resembles atelectasis or  scarring than infection. Otherwise negative lung bases. No pleural effusion. Hepatobiliary: Calcification or surgical clip now adjacent to the gallbladder fossa but the gallbladder remains in place. Negative noncontrast liver and gallbladder. Pancreas: Negative. Spleen: New surgical clips at the gastrosplenic ligament, but otherwise stable and negative spleen. Adrenals/Urinary Tract: Normal adrenal glands. Nonobstructed kidneys appear stable since 2018. Areas of chronic cortical scarring again noted on the left. Some renal vascular calcifications, but no nephrolithiasis. Ureters are decompressed and negative. Unremarkable bladder. Stomach/Bowel: Intermittent retained stool throughout the colon. Redundant sigmoid with gas and retained stool. Normal appendix which courses into the right hemipelvis from the right lower quadrant (series 3, image 63). No large bowel inflammation. Negative terminal ileum. No dilated small bowel. Small fat containing umbilical hernia is stable. Surgical clips along the lesser and greater curvature of the stomach are new since 2018. But the stomach and duodenum otherwise appear negative. No free air. No free fluid. No mesenteric inflammation. Vascular/Lymphatic: Aortoiliac calcified atherosclerosis. Normal caliber abdominal aorta. No lymphadenopathy. Reproductive: Surgically absent uterus as before. Diminutive or absent ovaries. Other: No pelvic free fluid. Musculoskeletal: Stable visualized osseous structures. No acute osseous abnormality identified. Chronic dystrophic calcification along the lower bilateral abdominal rectus muscles is stable. There is a small benign appearing lipoma of the right hip flexor muscle partially visible on series 3, image 88. IMPRESSION: 1. No acute or inflammatory process identified in the noncontrast abdomen or pelvis. Several new surgical clips in the upper abdomen are noted since 2018. The gallbladder remains in place. 2. Bilateral lung base costophrenic  angle opacity more resembles atelectasis or scarring than infection. 3. Aortic Atherosclerosis (ICD10-I70.0). Electronically Signed   By: Herminio Heads.D.  On: 09/14/2020 05:06   DG Chest Portable 1 View  Result Date: 09/14/2020 CLINICAL DATA:  Nausea vomiting EXAM: PORTABLE CHEST 1 VIEW COMPARISON:  09/17/2019, 10/30/2018. FINDINGS: Streaky atelectasis the left base. No pleural effusion or pneumothorax. Cardiomegaly with vascular congestion. IMPRESSION: 1. Cardiomegaly with vascular congestion. 2. Streaky atelectasis left base Electronically Signed   By: Donavan Foil M.D.   On: 09/14/2020 02:39    Procedures Procedures   Medications Ordered in ED Medications  ondansetron (ZOFRAN) injection 4 mg (4 mg Intravenous Given 09/14/20 0237)    ED Course  I have reviewed the triage vital signs and the nursing notes.  Pertinent labs & imaging results that were available during my care of the patient were reviewed by me and considered in my medical decision making (see chart for details).    MDM Rules/Calculators/A&P                         Labs/VS c/w sepsis. likley urinary source. Will treat for same.  Symptoms significantly improved. Tolerating PO. LA's WNL. No indication for admission at this time. Will dc on abx w/ pcp follow up and strict return precautions.   Final Clinical Impression(s) / ED Diagnoses Final diagnoses:  Non-intractable vomiting with nausea, unspecified vomiting type  Acute cystitis without hematuria    Rx / DC Orders ED Discharge Orders         Ordered    cephALEXin (KEFLEX) 500 MG capsule  4 times daily        09/14/20 0606    ondansetron (ZOFRAN ODT) 8 MG disintegrating tablet        09/14/20 0606           Askari Kinley, Corene Cornea, MD 09/15/20 [phone]

## 2020-09-14 NOTE — Sepsis Progress Note (Signed)
Following for sepsis monitoring.   Verified with bedside RN that the blood cultures were drawn prior to hanging antibiotics.

## 2020-09-15 ENCOUNTER — Inpatient Hospital Stay: Payer: Medicaid Other

## 2020-09-15 ENCOUNTER — Telehealth: Payer: Self-pay

## 2020-09-15 NOTE — Telephone Encounter (Signed)
Transition Care Management Unsuccessful Follow-up Telephone Call  Date of discharge and from where:  [place]   Attempts:  1st Attempt  Reason for unsuccessful TCM follow-up call:  Left voice message    

## 2020-09-16 ENCOUNTER — Other Ambulatory Visit: Payer: Self-pay

## 2020-09-16 ENCOUNTER — Inpatient Hospital Stay: Payer: Medicaid Other | Attending: Hematology

## 2020-09-16 VITALS — BP 112/61 | HR 57 | Temp 97.8°F | Resp 17

## 2020-09-16 DIAGNOSIS — Z5112 Encounter for antineoplastic immunotherapy: Secondary | ICD-10-CM | POA: Diagnosis not present

## 2020-09-16 DIAGNOSIS — Z79899 Other long term (current) drug therapy: Secondary | ICD-10-CM | POA: Insufficient documentation

## 2020-09-16 DIAGNOSIS — I780 Hereditary hemorrhagic telangiectasia: Secondary | ICD-10-CM | POA: Insufficient documentation

## 2020-09-16 DIAGNOSIS — K922 Gastrointestinal hemorrhage, unspecified: Secondary | ICD-10-CM | POA: Insufficient documentation

## 2020-09-16 DIAGNOSIS — D500 Iron deficiency anemia secondary to blood loss (chronic): Secondary | ICD-10-CM | POA: Diagnosis present

## 2020-09-16 MED ORDER — BEVACIZUMAB-BVZR CHEMO INJECTION 400 MG/16ML
5.0000 mg/kg | Freq: Once | INTRAVENOUS | Status: AC
  Administered 2020-09-16: 600 mg via INTRAVENOUS
  Filled 2020-09-16: qty 8

## 2020-09-16 MED ORDER — SODIUM CHLORIDE 0.9% FLUSH
10.0000 mL | INTRAVENOUS | Status: DC | PRN
  Administered 2020-09-16: 10 mL
  Filled 2020-09-16: qty 10

## 2020-09-16 MED ORDER — HEPARIN SOD (PORK) LOCK FLUSH 100 UNIT/ML IV SOLN
500.0000 [IU] | Freq: Once | INTRAVENOUS | Status: AC | PRN
  Administered 2020-09-16: 500 [IU]
  Filled 2020-09-16: qty 5

## 2020-09-16 MED ORDER — SODIUM CHLORIDE 0.9 % IV SOLN
250.0000 mg | Freq: Once | INTRAVENOUS | Status: AC
  Administered 2020-09-16: 250 mg via INTRAVENOUS
  Filled 2020-09-16: qty 20

## 2020-09-16 MED ORDER — SODIUM CHLORIDE 0.9 % IV SOLN
Freq: Once | INTRAVENOUS | Status: AC
  Filled 2020-09-16: qty 250

## 2020-09-16 NOTE — Patient Instructions (Signed)
[facility]    Discharge Instructions:  Thank you for choosing [facility] to provide your oncology and hematology care.   If you have a lab appointment with the [facility], please go directly to the [facility] and check in at the registration area.   Wear comfortable clothing and clothing appropriate for easy access to any Portacath or PICC line.   We strive to give you quality time with your provider. You may need to reschedule your appointment if you arrive late (15 or more minutes).  Arriving late affects you and other patients whose appointments are after yours.  Also, if you miss three or more appointments without notifying the office, you may be dismissed from the clinic at the provider's discretion.      For prescription refill requests, have your pharmacy contact our office and allow 72 hours for refills to be completed.    Today you received the following chemotherapy and/or immunotherapy agents Bevacizumab-bvzr (ZIRABEV).     To help prevent nausea and vomiting after your treatment, we encourage you to take your nausea medication as directed.  BELOW ARE SYMPTOMS THAT SHOULD BE REPORTED IMMEDIATELY: . *FEVER GREATER THAN 100.4 F (38 C) OR HIGHER . *CHILLS OR SWEATING . *NAUSEA AND VOMITING THAT IS NOT CONTROLLED WITH YOUR NAUSEA MEDICATION . *UNUSUAL SHORTNESS OF BREATH . *UNUSUAL BRUISING OR BLEEDING . *URINARY PROBLEMS (pain or burning when urinating, or frequent urination) . *BOWEL PROBLEMS (unusual diarrhea, constipation, pain near the anus) . TENDERNESS IN MOUTH AND THROAT WITH OR WITHOUT PRESENCE OF ULCERS (sore throat, sores in mouth, or a toothache) . UNUSUAL RASH, SWELLING OR PAIN  . UNUSUAL VAGINAL DISCHARGE OR ITCHING   Items with * indicate a potential emergency and should be followed up as soon as possible or go to the Emergency Department if any problems should occur.  Please show the CHEMOTHERAPY ALERT CARD  or IMMUNOTHERAPY ALERT CARD at check-in to the Emergency Department and triage nurse.  Should you have questions after your visit or need to cancel or reschedule your appointment, please contact [place]  Dept: [phone]  and follow the prompts.  Office hours are 8:00 a.m. to 4:30 p.m. Monday - Friday. Please note that voicemails left after 4:00 p.m. may not be returned until the following business day.  We are closed weekends and major holidays. You have access to a nurse at all times for urgent questions. Please call the main number to the clinic Dept: [phone] and follow the prompts.   For any non-urgent questions, you may also contact your provider using MyChart. We now offer e-Visits for anyone 30 and older to request care online for non-urgent symptoms. For details visit [email or web address].   Also download the MyChart app! Go to the app store, search "MyChart", open the app, select , and log in with your MyChart username and password.  Due to Covid, a mask is required upon entering the hospital/clinic. If you do not have a mask, one will be given to you upon arrival. For doctor visits, patients may have 1 support person aged 83 or older with them. For treatment visits, patients cannot have anyone with them due to current Covid guidelines and our immunocompromised population.    Sodium Ferric Gluconate Complex injection What is this medicine? SODIUM FERRIC GLUCONATE COMPLEX (SOE dee um FER ik GLOO koe nate KOM pleks) is an iron replacement. It is used with epoetin therapy to treat  low iron levels in patients who are receiving hemodialysis. This medicine may be used for other purposes; ask your health care provider or pharmacist if you have questions. COMMON BRAND NAME(S): Ferrlecit, Nulecit What should I tell my health care provider before I take this medicine? They need to know if you have any of the following conditions:  anemia that  is not from iron deficiency  high levels of iron in the body  an unusual or allergic reaction to iron, benzyl alcohol, other medicines, foods, dyes, or preservatives  pregnant or are trying to become pregnant  breast-feeding How should I use this medicine? This medicine is for infusion into a vein. It is given by a health care professional in a hospital or clinic setting. Talk to your pediatrician regarding the use of this medicine in children. While this drug may be prescribed for children as young as 73 years old for selected conditions, precautions do apply. Overdosage: If you think you have taken too much of this medicine contact a poison control center or emergency room at once. NOTE: This medicine is only for you. Do not share this medicine with others. What if I miss a dose? It is important not to miss your dose. Call your doctor or health care professional if you are unable to keep an appointment. What may interact with this medicine? Do not take this medicine with any of the following medications:  deferoxamine  dimercaprol  other iron products This medicine may also interact with the following medications:  chloramphenicol  deferasirox  medicine for blood pressure like enalapril This list may not describe all possible interactions. Give your health care provider a list of all the medicines, herbs, non-prescription drugs, or dietary supplements you use. Also tell them if you smoke, drink alcohol, or use illegal drugs. Some items may interact with your medicine. What should I watch for while using this medicine? Your condition will be monitored carefully while you are receiving this medicine. Visit your doctor for check-ups as directed. What side effects may I notice from receiving this medicine? Side effects that you should report to your doctor or health care professional as soon as possible:  allergic reactions like skin rash, itching or hives, swelling of the face,  lips, or tongue  breathing problems  changes in hearing  changes in vision  chills, flushing, or sweating  fast, irregular heartbeat  feeling faint or lightheaded, falls  fever, flu-like symptoms  high or low blood pressure  pain, tingling, numbness in the hands or feet  severe pain in the chest, back, flanks, or groin  swelling of the ankles, feet, hands  trouble passing urine or change in the amount of urine  unusually weak or tired Side effects that usually do not require medical attention (report to your doctor or health care professional if they continue or are bothersome):  cramps  dark colored stools  diarrhea  headache  nausea, vomiting  stomach upset This list may not describe all possible side effects. Call your doctor for medical advice about side effects. You may report side effects to FDA at 1-800-FDA-1088. Where should I keep my medicine? This drug is given in a hospital or clinic and will not be stored at home. NOTE: This sheet is a summary. It may not cover all possible information. If you have questions about this medicine, talk to your doctor, pharmacist, or health care provider.  2021 Elsevier/Gold Standard (2007-12-02 15:58:57)

## 2020-09-16 NOTE — Progress Notes (Signed)
Okay to treat with urine protein of 100 per Mikey Bussing NP

## 2020-09-18 NOTE — Telephone Encounter (Signed)
Transition Care Management Unsuccessful Follow-up Telephone Call  Date of discharge and from where:  09/14/2020 from [facility]  Attempts:  2nd Attempt  Reason for unsuccessful TCM follow-up call:  Left voice message

## 2020-09-19 LAB — CULTURE, BLOOD (SINGLE)
Culture: NO GROWTH
Special Requests: ADEQUATE

## 2020-09-19 NOTE — Telephone Encounter (Signed)
Transition Care Management Unsuccessful Follow-up Telephone Call  Date of discharge and from where:  09/14/2020 from [facility]  Attempts:  3rd Attempt  Reason for unsuccessful TCM follow-up call:  Unable to reach patient

## 2020-09-21 ENCOUNTER — Ambulatory Visit: Payer: Self-pay

## 2020-09-21 ENCOUNTER — Telehealth: Payer: Self-pay | Admitting: Hematology

## 2020-09-21 ENCOUNTER — Other Ambulatory Visit: Payer: Self-pay

## 2020-09-21 NOTE — Telephone Encounter (Signed)
Scheduled appointment per 06/09 sch msg. Patient is aware. 

## 2020-09-22 ENCOUNTER — Inpatient Hospital Stay: Payer: Medicaid Other

## 2020-09-22 ENCOUNTER — Other Ambulatory Visit: Payer: Self-pay

## 2020-09-22 VITALS — BP 133/57 | HR 54 | Temp 98.3°F | Resp 18

## 2020-09-22 DIAGNOSIS — D500 Iron deficiency anemia secondary to blood loss (chronic): Secondary | ICD-10-CM

## 2020-09-22 DIAGNOSIS — Z95828 Presence of other vascular implants and grafts: Secondary | ICD-10-CM

## 2020-09-22 DIAGNOSIS — R808 Other proteinuria: Secondary | ICD-10-CM

## 2020-09-22 DIAGNOSIS — Z5112 Encounter for antineoplastic immunotherapy: Secondary | ICD-10-CM | POA: Diagnosis not present

## 2020-09-22 DIAGNOSIS — I780 Hereditary hemorrhagic telangiectasia: Secondary | ICD-10-CM

## 2020-09-22 LAB — CBC WITH DIFFERENTIAL (CANCER CENTER ONLY)
Abs Immature Granulocytes: 0.06 10*3/uL (ref 0.00–0.07)
Basophils Absolute: 0.1 10*3/uL (ref 0.0–0.1)
Basophils Relative: 0 %
Eosinophils Absolute: 0.2 10*3/uL (ref 0.0–0.5)
Eosinophils Relative: 1 %
HCT: 25.8 % — ABNORMAL LOW (ref 36.0–46.0)
Hemoglobin: 7.6 g/dL — ABNORMAL LOW (ref 12.0–15.0)
Immature Granulocytes: 1 %
Lymphocytes Relative: 15 %
Lymphs Abs: 1.8 10*3/uL (ref 0.7–4.0)
MCH: 27.5 pg (ref 26.0–34.0)
MCHC: 29.5 g/dL — ABNORMAL LOW (ref 30.0–36.0)
MCV: 93.5 fL (ref 80.0–100.0)
Monocytes Absolute: 0.6 10*3/uL (ref 0.1–1.0)
Monocytes Relative: 5 %
Neutro Abs: 9.7 10*3/uL — ABNORMAL HIGH (ref 1.7–7.7)
Neutrophils Relative %: 78 %
Platelet Count: 416 10*3/uL — ABNORMAL HIGH (ref 150–400)
RBC: 2.76 MIL/uL — ABNORMAL LOW (ref 3.87–5.11)
RDW: 23.5 % — ABNORMAL HIGH (ref 11.5–15.5)
WBC Count: 12.3 10*3/uL — ABNORMAL HIGH (ref 4.0–10.5)
nRBC: 0.3 % — ABNORMAL HIGH (ref 0.0–0.2)

## 2020-09-22 LAB — FERRITIN: Ferritin: 226 ng/mL (ref 11–307)

## 2020-09-22 LAB — SAMPLE TO BLOOD BANK

## 2020-09-22 MED ORDER — SODIUM CHLORIDE 0.9% FLUSH
10.0000 mL | Freq: Once | INTRAVENOUS | Status: AC
  Administered 2020-09-22: 10 mL
  Filled 2020-09-22: qty 10

## 2020-09-22 MED ORDER — SODIUM CHLORIDE 0.9 % IV SOLN
INTRAVENOUS | Status: DC
  Filled 2020-09-22: qty 250

## 2020-09-22 MED ORDER — HEPARIN SOD (PORK) LOCK FLUSH 100 UNIT/ML IV SOLN
500.0000 [IU] | Freq: Once | INTRAVENOUS | Status: AC
  Administered 2020-09-22: 500 [IU]
  Filled 2020-09-22: qty 5

## 2020-09-22 MED ORDER — SODIUM CHLORIDE 0.9 % IV SOLN
250.0000 mg | Freq: Once | INTRAVENOUS | Status: AC
  Administered 2020-09-22: 250 mg via INTRAVENOUS
  Filled 2020-09-22: qty 20

## 2020-09-22 NOTE — Patient Instructions (Signed)
[facility]  Discharge Instructions: Thank you for choosing [facility] to provide your oncology and hematology care.   If you have a lab appointment with the [facility], please go directly to the [facility] and check in at the registration area.   Wear comfortable clothing and clothing appropriate for easy access to any Portacath or PICC line.   We strive to give you quality time with your provider. You may need to reschedule your appointment if you arrive late (15 or more minutes).  Arriving late affects you and other patients whose appointments are after yours.  Also, if you miss three or more appointments without notifying the office, you may be dismissed from the clinic at the provider's discretion.      For prescription refill requests, have your pharmacy contact our office and allow 72 hours for refills to be completed.    Today you received the following medication - Ferrlecit      To help prevent nausea and vomiting after your treatment, we encourage you to take your nausea medication as directed.  BELOW ARE SYMPTOMS THAT SHOULD BE REPORTED IMMEDIATELY: *FEVER GREATER THAN 100.4 F (38 C) OR HIGHER *CHILLS OR SWEATING *NAUSEA AND VOMITING THAT IS NOT CONTROLLED WITH YOUR NAUSEA MEDICATION *UNUSUAL SHORTNESS OF BREATH *UNUSUAL BRUISING OR BLEEDING *URINARY PROBLEMS (pain or burning when urinating, or frequent urination) *BOWEL PROBLEMS (unusual diarrhea, constipation, pain near the anus) TENDERNESS IN MOUTH AND THROAT WITH OR WITHOUT PRESENCE OF ULCERS (sore throat, sores in mouth, or a toothache) UNUSUAL RASH, SWELLING OR PAIN  UNUSUAL VAGINAL DISCHARGE OR ITCHING   Items with * indicate a potential emergency and should be followed up as soon as possible or go to the Emergency Department if any problems should occur.  Please show the CHEMOTHERAPY ALERT CARD or IMMUNOTHERAPY ALERT CARD at check-in to the Emergency Department and  triage nurse.  Should you have questions after your visit or need to cancel or reschedule your appointment, please contact [facility]  Dept: [phone]  and follow the prompts.  Office hours are 8:00 a.m. to 4:30 p.m. Monday - Friday. Please note that voicemails left after 4:00 p.m. may not be returned until the following business day.  We are closed weekends and major holidays. You have access to a nurse at all times for urgent questions. Please call the main number to the clinic Dept: [phone] and follow the prompts.   For any non-urgent questions, you may also contact your provider using MyChart. We now offer e-Visits for anyone 70 and older to request care online for non-urgent symptoms. For details visit [email or web address].   Also download the MyChart app! Go to the app store, search "MyChart", open the app, select [facility], and log in with your MyChart username and password.  Due to Covid, a mask is required upon entering the hospital/clinic. If you do not have a mask, one will be given to you upon arrival. For doctor visits, patients may have 1 support person aged 70 or older with them. For treatment visits, patients cannot have anyone with them due to current Covid guidelines and our immunocompromised population.   Sodium Ferric Gluconate Complex injection What is this medication? SODIUM FERRIC GLUCONATE COMPLEX (SOE dee um FER ik GLOO koe nate KOM pleks) is an iron replacement. It is used with epoetin therapy to treat low iron levelsin patients who are receiving hemodialysis. This medicine may be used for other purposes;  ask your health care provider orpharmacist if you have questions. COMMON BRAND NAME(S): Ferrlecit, Nulecit What should I tell my care team before I take this medication? They need to know if you have any of the following conditions: anemia that is not from iron deficiency high levels of iron in the body an unusual or allergic  reaction to iron, benzyl alcohol, other medicines, foods, dyes, or preservatives pregnant or are trying to become pregnant breast-feeding How should I use this medication? This medicine is for infusion into a vein. It is given by a health careprofessional in a hospital or clinic setting. Talk to your pediatrician regarding the use of this medicine in children. While this drug may be prescribed for children as young as 8 years old for selectedconditions, precautions do apply. Overdosage: If you think you have taken too much of this medicine contact apoison control center or emergency room at once. NOTE: This medicine is only for you. Do not share this medicine with others. What if I miss a dose? It is important not to miss your dose. Call your doctor or health careprofessional if you are unable to keep an appointment. What may interact with this medication? Do not take this medicine with any of the following medications: deferoxamine dimercaprol other iron products This medicine may also interact with the following medications: chloramphenicol deferasirox medicine for blood pressure like enalapril This list may not describe all possible interactions. Give your health care provider a list of all the medicines, herbs, non-prescription drugs, or dietary supplements you use. Also tell them if you smoke, drink alcohol, or use illegaldrugs. Some items may interact with your medicine. What should I watch for while using this medication? Your condition will be monitored carefully while you are receiving thismedicine. Visit your doctor for check-ups as directed. What side effects may I notice from receiving this medication? Side effects that you should report to your doctor or health care professionalas soon as possible: allergic reactions like skin rash, itching or hives, swelling of the face, lips, or tongue breathing problems changes in hearing changes in vision chills, flushing, or  sweating fast, irregular heartbeat feeling faint or lightheaded, falls fever, flu-like symptoms high or low blood pressure pain, tingling, numbness in the hands or feet severe pain in the chest, back, flanks, or groin swelling of the ankles, feet, hands trouble passing urine or change in the amount of urine unusually weak or tired Side effects that usually do not require medical attention (report to yourdoctor or health care professional if they continue or are bothersome): cramps dark colored stools diarrhea headache nausea, vomiting stomach upset This list may not describe all possible side effects. Call your doctor for medical advice about side effects. You may report side effects to FDA at1-800-FDA-1088. Where should I keep my medication? This drug is given in a hospital or clinic and will not be stored at home. NOTE: This sheet is a summary. It may not cover all possible information. If you have questions about this medicine, talk to your doctor, pharmacist, orhealth care provider.  2022 Elsevier/Gold Standard (2007-12-02 15:58:57)

## 2020-09-26 ENCOUNTER — Telehealth: Payer: Self-pay

## 2020-09-26 DIAGNOSIS — N3941 Urge incontinence: Secondary | ICD-10-CM | POA: Diagnosis not present

## 2020-09-26 DIAGNOSIS — N319 Neuromuscular dysfunction of bladder, unspecified: Secondary | ICD-10-CM | POA: Diagnosis not present

## 2020-09-26 NOTE — Telephone Encounter (Signed)
Patient called stating that she was seen in the ED on 6/1 for vomiting, back pain and chills.  Patient was given Keflex and Zofran for UTI. Patient states that vomiting has stopped and urine appears to be clearing up but the back pain is still pretty bad.  This nurse spoke with Hendricks Limes who requested patient to come in for labs at 67 and office visit at 38 on 6/15.  In the meantime patient is to take 650 mg of Tylenol every 6 hrs for back pain.  Patient acknowledged and agreed.  No further questions or concerns at this time.

## 2020-09-27 ENCOUNTER — Inpatient Hospital Stay: Payer: Medicaid Other

## 2020-09-27 ENCOUNTER — Telehealth: Payer: Self-pay

## 2020-09-27 ENCOUNTER — Other Ambulatory Visit: Payer: Self-pay | Admitting: Nurse Practitioner

## 2020-09-27 ENCOUNTER — Other Ambulatory Visit: Payer: Self-pay

## 2020-09-27 ENCOUNTER — Other Ambulatory Visit: Payer: Self-pay | Admitting: *Deleted

## 2020-09-27 ENCOUNTER — Ambulatory Visit: Payer: Medicaid Other | Admitting: Behavioral Health

## 2020-09-27 ENCOUNTER — Inpatient Hospital Stay (HOSPITAL_BASED_OUTPATIENT_CLINIC_OR_DEPARTMENT_OTHER): Payer: Medicaid Other | Admitting: Nurse Practitioner

## 2020-09-27 VITALS — BP 126/69 | HR 57 | Temp 97.7°F | Resp 17 | Ht 66.0 in | Wt 261.8 lb

## 2020-09-27 DIAGNOSIS — D500 Iron deficiency anemia secondary to blood loss (chronic): Secondary | ICD-10-CM

## 2020-09-27 DIAGNOSIS — Z95828 Presence of other vascular implants and grafts: Secondary | ICD-10-CM

## 2020-09-27 DIAGNOSIS — N390 Urinary tract infection, site not specified: Secondary | ICD-10-CM

## 2020-09-27 DIAGNOSIS — I780 Hereditary hemorrhagic telangiectasia: Secondary | ICD-10-CM

## 2020-09-27 DIAGNOSIS — M545 Low back pain, unspecified: Secondary | ICD-10-CM | POA: Diagnosis not present

## 2020-09-27 DIAGNOSIS — Z5112 Encounter for antineoplastic immunotherapy: Secondary | ICD-10-CM | POA: Diagnosis not present

## 2020-09-27 LAB — CBC WITH DIFFERENTIAL (CANCER CENTER ONLY)
Abs Immature Granulocytes: 0.09 10*3/uL — ABNORMAL HIGH (ref 0.00–0.07)
Basophils Absolute: 0.1 10*3/uL (ref 0.0–0.1)
Basophils Relative: 0 %
Eosinophils Absolute: 0.2 10*3/uL (ref 0.0–0.5)
Eosinophils Relative: 2 %
HCT: 23.9 % — ABNORMAL LOW (ref 36.0–46.0)
Hemoglobin: 6.7 g/dL — CL (ref 12.0–15.0)
Immature Granulocytes: 1 %
Lymphocytes Relative: 13 %
Lymphs Abs: 1.7 10*3/uL (ref 0.7–4.0)
MCH: 26.4 pg (ref 26.0–34.0)
MCHC: 28 g/dL — ABNORMAL LOW (ref 30.0–36.0)
MCV: 94.1 fL (ref 80.0–100.0)
Monocytes Absolute: 0.5 10*3/uL (ref 0.1–1.0)
Monocytes Relative: 4 %
Neutro Abs: 10.4 10*3/uL — ABNORMAL HIGH (ref 1.7–7.7)
Neutrophils Relative %: 80 %
Platelet Count: 278 10*3/uL (ref 150–400)
RBC: 2.54 MIL/uL — ABNORMAL LOW (ref 3.87–5.11)
RDW: 24.2 % — ABNORMAL HIGH (ref 11.5–15.5)
WBC Count: 12.9 10*3/uL — ABNORMAL HIGH (ref 4.0–10.5)
nRBC: 0.5 % — ABNORMAL HIGH (ref 0.0–0.2)

## 2020-09-27 LAB — CMP (CANCER CENTER ONLY)
ALT: 12 U/L (ref 0–44)
AST: 14 U/L — ABNORMAL LOW (ref 15–41)
Albumin: 3.1 g/dL — ABNORMAL LOW (ref 3.5–5.0)
Alkaline Phosphatase: 70 U/L (ref 38–126)
Anion gap: 9 (ref 5–15)
BUN: 12 mg/dL (ref 6–20)
CO2: 23 mmol/L (ref 22–32)
Calcium: 8.9 mg/dL (ref 8.9–10.3)
Chloride: 110 mmol/L (ref 98–111)
Creatinine: 0.82 mg/dL (ref 0.44–1.00)
GFR, Estimated: >60 mL/min (ref >60)
Glucose, Bld: 112 mg/dL — ABNORMAL HIGH (ref 70–99)
Potassium: 3.8 mmol/L (ref 3.5–5.1)
Sodium: 142 mmol/L (ref 135–145)
Total Bilirubin: <0.2 mg/dL — ABNORMAL LOW (ref 0.3–1.2)
Total Protein: 6.6 g/dL (ref 6.5–8.1)

## 2020-09-27 LAB — SAMPLE TO BLOOD BANK

## 2020-09-27 LAB — PREPARE RBC (CROSSMATCH)

## 2020-09-27 MED ORDER — HEPARIN SOD (PORK) LOCK FLUSH 100 UNIT/ML IV SOLN
500.0000 [IU] | Freq: Once | INTRAVENOUS | Status: AC
  Administered 2020-09-27: 500 [IU]
  Filled 2020-09-27: qty 5

## 2020-09-27 MED ORDER — CIPROFLOXACIN HCL 500 MG PO TABS: 500.0000 mg | ORAL_TABLET | Freq: Two times a day (BID) | ORAL | 0 refills | Status: DC

## 2020-09-27 MED ORDER — TRAMADOL HCL 50 MG PO TABS: 50.0000 mg | ORAL_TABLET | Freq: Every day | ORAL | 0 refills | Status: DC | PRN

## 2020-09-27 MED ORDER — SODIUM CHLORIDE 0.9% FLUSH
10.0000 mL | Freq: Once | INTRAVENOUS | Status: AC
  Administered 2020-09-27: 10 mL
  Filled 2020-09-27: qty 10

## 2020-09-27 NOTE — Telephone Encounter (Signed)
CRITICAL VALUE STICKER  CRITICAL VALUE: HGB 6.7  DATE & TIME NOTIFIED: 6/15 12:33PM  This nurse spoke with NP and made aware of critical value. Acknowledged understanding.  No further questions at this time.

## 2020-09-27 NOTE — Progress Notes (Signed)
Prepare

## 2020-09-27 NOTE — Progress Notes (Signed)
[place]   Telephone:([phone]) [phone] Fax:([phone]) [phone]   Clinic Follow up Note   Patient Care Team: [name], MD as PCP - General (Internal Medicine) 09/27/2020  CHIEF COMPLAINT: Symptom management visit, back pain; h/o HHT and anemia  INTERVAL HISTORY: Ms. Apodaca presents for symptom management visit.  She was last seen by me on 08/24/2020, and continues weekly iron and every 2 week Avastin.  On/around 5/26 she developed significant back pain, pelvic pressure, dysuria, nausea/vomiting and went to ED on 6/1.  Work-up showed slightly decreased renal function, Hg 8.6, normal lactic acid, negative respiratory and blood culture.  UA with elevated white blood cells, leukocytes, and nitrate positive consistent with UTI.  She was treated for urinary sepsis with oral Keflex which she has completed.  She presents today with severe back pain, residual pelvic pressure and frequency.  Tylenol is not effective.  Denies fever or chills since ED visit.  She had 2 large nosebleeds on 6/13 lasting 1 and 2 hours with clots.  Denies GI bleeding.  She has exertional dyspnea similar to when she needs blood transfusion.  Denies cough or chest pain. She has had back pain before, from arthritis, received injections and followed by ortho. She feels this is different.    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    SURGICAL HISTORY: Past Surgical History:  Procedure Laterality Date   ABDOMINAL HYSTERECTOMY     CARPAL TUNNEL RELEASE  05/13/2011   Procedure: CARPAL TUNNEL RELEASE;  Surgeon: Nita Sells, MD;   Location: [place];  Service: Orthopedics;  Laterality: Left;   COLONOSCOPY N/A 03/02/2020   Procedure: COLONOSCOPY;  Surgeon: Ronald Lobo, MD;  Location: [facility] ENDOSCOPY;  Service: Endoscopy;  Laterality: N/A;   COLONOSCOPY WITH PROPOFOL N/A 04/28/2014   Procedure: COLONOSCOPY WITH PROPOFOL;  Surgeon: Cleotis Nipper, MD;  Location: [facility] ENDOSCOPY;  Service: Endoscopy;  Laterality: N/A;   DG TOES*L*  2/10   rt   DILATION AND CURETTAGE OF UTERUS     ENTEROSCOPY N/A 10/17/2017   Procedure: ENTEROSCOPY;  Surgeon: Otis Brace, MD;  Location: [place];  Service: Gastroenterology;  Laterality: N/A;   ESOPHAGOGASTRODUODENOSCOPY N/A 04/10/2014   Procedure: ESOPHAGOGASTRODUODENOSCOPY (EGD);  Surgeon: [name], MD;  Location[facility] ENDOSCOPY;  Service: Endoscopy;  Laterality: N/A;   ESOPHAGOGASTRODUODENOSCOPY N/A 05/10/2017   Procedure: ESOPHAGOGASTRODUODENOSCOPY (EGD);  Surgeon: Ronald Lobo, MD;  Location: [facility] ENDOSCOPY;  Service: Endoscopy;  Laterality: N/A;   ESOPHAGOGASTRODUODENOSCOPY N/A 09/22/2017   Procedure: ESOPHAGOGASTRODUODENOSCOPY (EGD);  Surgeon: Clarene Essex, MD;  Location: [place];  Service: Endoscopy;  Laterality: N/A;  bedside   ESOPHAGOGASTRODUODENOSCOPY N/A 03/02/2020   Procedure: ESOPHAGOGASTRODUODENOSCOPY (EGD);  Surgeon: Ronald Lobo, MD;  Location: [facility];  Service: Endoscopy;  Laterality: N/A;   ESOPHAGOGASTRODUODENOSCOPY (EGD) WITH PROPOFOL N/A 04/27/2014   Procedure: ESOPHAGOGASTRODUODENOSCOPY (EGD) WITH PROPOFOL;  Surgeon: Cleotis Nipper, MD;  Location: [place];  Service: Endoscopy;  Laterality: N/A;  possible apc   ESOPHAGOGASTRODUODENOSCOPY (EGD) WITH PROPOFOL N/A 09/30/2017   Procedure: ESOPHAGOGASTRODUODENOSCOPY (EGD) WITH PROPOFOL;  Surgeon: [name], MD;  Location: [facility] ENDOSCOPY;  Service: Gastroenterology;  Laterality: N/A;   ESOPHAGOGASTRODUODENOSCOPY (EGD) WITH PROPOFOL N/A 10/01/2017   Procedure:  ESOPHAGOGASTRODUODENOSCOPY (EGD) WITH PROPOFOL;  Surgeon: [name], MD;  Location: [place];  Service: Gastroenterology;  Laterality: N/A;   ESOPHAGOGASTRODUODENOSCOPY (EGD) WITH PROPOFOL N/A 10/08/2017   Procedure: ESOPHAGOGASTRODUODENOSCOPY (EGD) WITH PROPOFOL;  Surgeon: Otis Brace, MD;  Location: [place];  Service: Gastroenterology;  Laterality: N/A;   ESOPHAGOGASTRODUODENOSCOPY (EGD) WITH PROPOFOL N/A 10/17/2017   Procedure: ESOPHAGOGASTRODUODENOSCOPY (EGD) WITH PROPOFOL;  Surgeon: Otis Brace, MD;  Location: [place];  Service: Gastroenterology;  Laterality: N/A;   ESOPHAGOGASTRODUODENOSCOPY (EGD) WITH PROPOFOL N/A 10/19/2017   Procedure: ESOPHAGOGASTRODUODENOSCOPY (EGD) WITH PROPOFOL;  Surgeon: Otis Brace, MD;  Location: [place];  Service: Gastroenterology;  Laterality: N/A;   ESOPHAGOGASTRODUODENOSCOPY (EGD) WITH PROPOFOL N/A 12/04/2018   Procedure: ESOPHAGOGASTRODUODENOSCOPY (EGD) WITH PROPOFOL;  Surgeon: Wilford Corner, MD;  Location: [facility] ENDOSCOPY;  Service: Endoscopy;  Laterality: N/A;   GIVENS CAPSULE STUDY N/A 10/02/2017   Procedure: GIVENS CAPSULE STUDY;  Surgeon: [name], MD;  Location: [place];  Service: Gastroenterology;  Laterality: N/A;   GIVENS CAPSULE STUDY N/A 10/08/2017   Procedure: GIVENS CAPSULE STUDY;  Surgeon: Otis Brace, MD;  Location: [place];  Service: Gastroenterology;  Laterality: N/A;  endoscopic placement of capsule   GIVENS CAPSULE STUDY N/A 03/02/2020   Procedure: GIVENS CAPSULE STUDY;  Surgeon: Ronald Lobo, MD;  Location: [facility]OS[facility];  Service: Endoscopy;  Laterality: N/A;   HEMOSTASIS CLIP PLACEMENT  12/04/2018   Procedure: HEMOSTASIS CLIP PLACEMENT;  Surgeon: Wilford Corner, MD;  Location: [facility]OS[facility];  Service: Endoscopy;;   HOT HEMOSTASIS N/A 04/27/2014   Procedure: HOT HEMOSTASIS (ARGON PLASMA COAGULATION/BICAP);  Surgeon: Cleotis Nipper, MD;  Location: [facility] ENDOSCOPY;  Service: Endoscopy;   Laterality: N/A;   HOT HEMOSTASIS N/A 09/30/2017   Procedure: HOT HEMOSTASIS (ARGON PLASMA COAGULATION/BICAP);  Surgeon: [name], MD;  Location: [place];  Service: Gastroenterology;  Laterality: N/A;   HOT HEMOSTASIS N/A 10/01/2017   Procedure: HOT HEMOSTASIS (ARGON PLASMA COAGULATION/BICAP);  Surgeon: [name], MD;  Location: [place];  Service: Gastroenterology;  Laterality: N/A;   HOT HEMOSTASIS N/A 10/17/2017   Procedure: HOT HEMOSTASIS (ARGON PLASMA COAGULATION/BICAP);  Surgeon: Otis Brace, MD;  Location: [facility] ENDOSCOPY;  Service: Gastroenterology;  Laterality: N/A;   HOT HEMOSTASIS N/A 10/19/2017   Procedure: HOT HEMOSTASIS (ARGON PLASMA COAGULATION/BICAP);  Surgeon: Otis Brace, MD;  Location: [facility] ENDOSCOPY;  Service: Gastroenterology;  Laterality: N/A;   HOT HEMOSTASIS N/A 03/02/2020   Procedure: HOT HEMOSTASIS (ARGON PLASMA COAGULATION/BICAP);  Surgeon: Ronald Lobo, MD;  Location: [facility];  Service: Endoscopy;  Laterality: N/A;   IR IMAGING GUIDED PORT INSERTION  07/08/2018   L shoulder Surgery  2011   POLYPECTOMY  03/02/2020   Procedure: POLYPECTOMY;  Surgeon: Ronald Lobo, MD;  Location: [facility] ENDOSCOPY;  Service: Endoscopy;;   SUBMUCOSAL INJECTION  09/22/2017   Procedure: SUBMUCOSAL INJECTION;  Surgeon: Clarene Essex, MD;  Location: [facility] ENDOSCOPY;  Service: Endoscopy;;   SUBMUCOSAL INJECTION  12/04/2018   Procedure: SUBMUCOSAL INJECTION;  Surgeon: Wilford Corner, MD;  Location: [facility] ENDOSCOPY;  Service: Endoscopy;;    I have reviewed the social history and family history with the patient and they are unchanged from previous note.  ALLERGIES:  is allergic to feraheme [ferumoxytol], nsaids, tomato, iron (ferrous sulfate) [ferrous sulfate er], other, and wasp venom.  MEDICATIONS:  Current Outpatient Medications  Medication Sig Dispense Refill   Accu-Chek Softclix Lancets lancets Use to check blood sugar before breakfast and  before dinner while on steroids  100 each 1   acitretin (SORIATANE) 10 MG capsule Take 10 mg by mouth daily.     albuterol (VENTOLIN HFA) 108 (90 Base) MCG/ACT inhaler Inhale 1-2 puffs into the lungs every 6 (six) hours as needed for wheezing or shortness of breath. 1 each 1   ALPRAZolam (XANAX) 0.5 MG tablet Take 1 tablet (0.5 mg total) by mouth at bedtime as needed for anxiety. 30 tablet 0   Aminocaproic Acid 1000 MG TABS Take 1 tablet (1,000 mg total) by mouth in the morning and at bedtime. TAKE 1 TABLET(1000 MG) BY MOUTH TWICE DAILY 60 tablet 3   amLODipine (NORVASC) 10 MG tablet TAKE 1 TABLET(10 MG) BY MOUTH DAILY 90 tablet 0   atorvastatin (LIPITOR) 80 MG tablet TAKE 1 TABLET(80 MG) BY MOUTH DAILY (Patient taking differently: Take 80 mg by mouth daily. TAKE 1 TABLET(80 MG) BY MOUTH DAILY) 30 tablet 3   cetirizine (ZYRTEC) 10 MG tablet TAKE 1 TABLET(10 MG) BY MOUTH DAILY 90 tablet 1   ciprofloxacin (CIPRO) 500 MG tablet Take 1 tablet (500 mg total) by mouth 2 (two) times daily. 14 tablet 0   diphenoxylate-atropine (LOMOTIL) 2.5-0.025 MG tablet 1 to 2 PO QID prn diarrhea (Patient taking differently: Take 1-2 tablets by mouth 4 (four) times daily as needed for diarrhea or loose stools.) 30 tablet 1   ferrous gluconate (FERGON) 324 MG tablet 1 tablet     furosemide (LASIX) 40 MG tablet 1 tablet     glucose blood (ACCU-CHEK GUIDE) test strip Check blood sugar 2 times per day while on steroids before breakfast and dinner 50 each 3   hydrochlorothiazide (HYDRODIURIL) 12.5 MG tablet TAKE 1 TABLET(12.5 MG) BY MOUTH DAILY 90 tablet 1   lidocaine-prilocaine (EMLA) cream Apply 1 application topically as needed. 30 g 0   metFORMIN (GLUCOPHAGE) 500 MG tablet Take 1 tablet (500 mg total) by mouth 2 (two) times daily with a meal. 180 tablet 3   metoprolol succinate (TOPROL-XL) 100 MG 24 hr tablet 1 tablet     ondansetron (ZOFRAN ODT) 8 MG disintegrating tablet 8mg  ODT q4 hours prn nausea 30 tablet 0   pantoprazole (PROTONIX) 40 MG tablet  TAKE 1 TABLET(40 MG) BY MOUTH TWICE DAILY 60 tablet 5   Podiatric Products (FLEXITOL HEEL BALM) OINT Apply 1 application topically as needed (dry skin).      potassium chloride (KLOR-CON) 20 MEQ packet Take 20 mEq by mouth daily. 30 packet 1   prochlorperazine (COMPAZINE) 10 MG tablet Take 1 tablet (10 mg total) by mouth every 6 (six) hours as needed for nausea or vomiting. 30 tablet 1   sertraline (ZOLOFT) 100 MG tablet TAKE 1 TABLET(100 MG) BY MOUTH DAILY (Patient taking differently: Take 100 mg by mouth daily.) 90 tablet 1   sertraline (ZOLOFT) 25 MG tablet Take 1 tablet (25 mg total) by mouth daily. 30 tablet 2   terbinafine (LAMISIL AT) 1 % cream Apply 1 application topically 2 (two) times daily. Both bottom and top of both feet and toes 36 g 0   traMADol (ULTRAM) 50 MG tablet Take 1 tablet (50 mg total) by mouth daily as needed for severe pain. 7 tablet 0   traZODone (DESYREL) 100 MG tablet TAKE 1 TABLET(100 MG) BY MOUTH AT BEDTIME 30 tablet 2   No current facility-administered medications for this visit.   Facility-Administered Medications Ordered in Other Visits  Medication Dose Route Frequency Provider Last Rate Last Admin   sodium  chloride flush (NS) 0.9 % injection 10 mL  10 mL Intracatheter PRN Truitt Merle, MD   10 mL at 08/14/20 1725    PHYSICAL EXAMINATION:  Vitals:   09/27/20 1240  BP: 126/69  Pulse: (!) 57  Resp: 17  Temp: 97.7 F (36.5 C)  SpO2: 100%   Filed Weights   09/27/20 1240  Weight: 261 lb 12.8 oz (118.8 kg)    GENERAL:alert, no distress and comfortable SKIN: no rash  EYES: sclera clear LUNGS: clear with normal breathing effort HEART: regular rate & rhythm, no lower extremity edema ABDOMEN: suprapubic tenderness  Musculoskeletal: bilateral flank tenderness R>L and low middle back NEURO: alert & oriented x 3 with fluent speech. Slow gait and limited mobility PAC without erythema   LABORATORY DATA:  I have reviewed the data as listed CBC Latest Ref  Rng & Units 09/27/2020 09/22/2020 09/13/2020  WBC 4.0 - 10.5 K/uL 12.9(H) 12.3(H) 20.3(H)  Hemoglobin 12.0 - 15.0 g/dL 6.7(LL) 7.6(L) 8.6(L)  Hematocrit 36.0 - 46.0 % 23.9(L) 25.8(L) 29.7(L)  Platelets 150 - 400 K/uL 278 416(H) 353     CMP Latest Ref Rng & Units 09/27/2020 09/13/2020 08/14/2020  Glucose 70 - 99 mg/dL 112(H) 159(H) 107(H)  BUN 6 - 20 mg/dL 12 11 7   Creatinine 0.44 - 1.00 mg/dL 0.82 1.03(H) 0.78  Sodium 135 - 145 mmol/L 142 139 141  Potassium 3.5 - 5.1 mmol/L 3.8 3.4(L) 3.5  Chloride 98 - 111 mmol/L 110 104 106  CO2 22 - 32 mmol/L 23 23 25   Calcium 8.9 - 10.3 mg/dL 8.9 9.3 8.8(L)  Total Protein 6.5 - 8.1 g/dL 6.6 7.5 7.0  Total Bilirubin 0.3 - 1.2 mg/dL <0.2(L) 0.6 <0.2(L)  Alkaline Phos 38 - 126 U/L 70 70 67  AST 15 - 41 U/L 14(L) 19 12(L)  ALT 0 - 44 U/L 12 11 <6      RADIOGRAPHIC STUDIES: I have personally reviewed the radiological images as listed and agreed with the findings in the report. No results found.   ASSESSMENT & PLAN: DARIELLE HANCHER is a 60 y.o. female with   Back pain, urinary sepsis per ED work up 09/13/20 -developed dysuria, frequency, n/v, and back pain. Scr 1.03, Hg 8.6, LA normal, resp panel normal. UA nitrite +  -CT unremarkable for acute process  -treated with keflex -residual back pain, pelvic pressure, and frequency  -will treat as complicated UTI/pyelo - cipro 500 BID x1 week and pain medication -consider other causes if UTI resolves but back pain does not  2. 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 Reactive Leukocytosis and intermittent reactive thrombocytosis -stable  5. Chronic Epistaxis,  h/o recurrent GI Bleeding; Followed by GI, Dr. Michail Sermon and [facility]  6. Right arm DVT, found during hospitalization 11/2018 - not a candidate for anticoagulation due to severe GI bleeding  7. HTN, on amlodipine and HCTZ  8. Chronic left knee pain, followed by ortho   Disposition:  Ms. Mcadams appears stable but in significant pain. Exam shows focal tenderness in bilateral flank and low back. Recent ED work up c/w urinary sepsis (culture was not done), with residual symptoms after completing keflex. We discussed the possibility she has complicated UTI/pyelo which is contributing to her back pain. I recommend to repeat UA/culture and treat empirically with cipro while awaiting sensitivities.   I discussed her severe back pain is likely multifactorial, such as from her h/o arthritis. CT in ED was unremarkable. If it does not resolve with second course antibiotics, I suggest she f/up with ortho and PCP to consider other causes. Will give short course of tramadol 1 tab daily PRN x1 week for severe pain, given that tylenol is not effective.   VSS. Labs show acute on chronic anemia Hg 6.7, she is symptomatic. On amicar BID, weekly iron, and q2 week bevacizumab. She had significant epistaxis recently, no obvious GI bleeding. Will arrange for 1 u RBC and beva on 09/28/20, then iron later this week as scheduling allows.   F/up 6/23 as scheduled.      Orders Placed This Encounter  Procedures   Urine Culture    Standing Status:   Future    Standing Expiration Date:   09/27/2021   Urinalysis, Complete w Microscopic    Standing Status:   Future    Standing Expiration Date:   09/27/2021   All questions were answered. The patient knows to call the clinic with any problems, questions or concerns. No barriers to learning were detected. Total encounter time was 30 minutes.      Alla Feeling, NP 09/27/20

## 2020-09-28 ENCOUNTER — Ambulatory Visit: Payer: Self-pay

## 2020-09-28 ENCOUNTER — Inpatient Hospital Stay: Payer: Medicaid Other

## 2020-09-28 ENCOUNTER — Other Ambulatory Visit: Payer: Self-pay

## 2020-09-28 VITALS — BP 168/72 | HR 52 | Temp 98.1°F | Resp 18

## 2020-09-28 DIAGNOSIS — D500 Iron deficiency anemia secondary to blood loss (chronic): Secondary | ICD-10-CM

## 2020-09-28 DIAGNOSIS — R808 Other proteinuria: Secondary | ICD-10-CM

## 2020-09-28 DIAGNOSIS — Z5112 Encounter for antineoplastic immunotherapy: Secondary | ICD-10-CM | POA: Diagnosis not present

## 2020-09-28 DIAGNOSIS — I780 Hereditary hemorrhagic telangiectasia: Secondary | ICD-10-CM

## 2020-09-28 LAB — URINALYSIS, COMPLETE (UACMP) WITH MICROSCOPIC
Bacteria, UA: NONE SEEN
Bilirubin Urine: NEGATIVE
Glucose, UA: NEGATIVE mg/dL
Hgb urine dipstick: NEGATIVE
Ketones, ur: NEGATIVE mg/dL
Leukocytes,Ua: NEGATIVE
Nitrite: NEGATIVE
Protein, ur: 100 mg/dL — AB
Specific Gravity, Urine: 1.025 (ref 1.005–1.030)
pH: 6 (ref 5.0–8.0)

## 2020-09-28 LAB — PREPARE RBC (CROSSMATCH)

## 2020-09-28 MED ORDER — DIPHENHYDRAMINE HCL 25 MG PO CAPS
25.0000 mg | ORAL_CAPSULE | Freq: Once | ORAL | Status: AC
Start: 2020-09-28 — End: 2020-09-28
  Administered 2020-09-28: 25 mg via ORAL

## 2020-09-28 MED ORDER — ACETAMINOPHEN 325 MG PO TABS
ORAL_TABLET | ORAL | Status: AC
  Filled 2020-09-28: qty 2

## 2020-09-28 MED ORDER — SODIUM CHLORIDE 0.9 % IV SOLN
5.0000 mg/kg | Freq: Once | INTRAVENOUS | Status: AC
  Administered 2020-09-28: 600 mg via INTRAVENOUS
  Filled 2020-09-28: qty 8

## 2020-09-28 MED ORDER — SODIUM CHLORIDE 0.9% FLUSH
10.0000 mL | INTRAVENOUS | Status: DC | PRN
  Filled 2020-09-28: qty 10

## 2020-09-28 MED ORDER — SODIUM CHLORIDE 0.9% IV SOLUTION
250.0000 mL | Freq: Once | INTRAVENOUS | Status: AC
  Administered 2020-09-28: 250 mL via INTRAVENOUS
  Filled 2020-09-28: qty 250

## 2020-09-28 MED ORDER — DIPHENHYDRAMINE HCL 25 MG PO CAPS
ORAL_CAPSULE | ORAL | Status: AC
  Filled 2020-09-28: qty 1

## 2020-09-28 MED ORDER — ACETAMINOPHEN 325 MG PO TABS
650.0000 mg | ORAL_TABLET | Freq: Once | ORAL | Status: AC
  Administered 2020-09-28: 650 mg via ORAL

## 2020-09-28 MED ORDER — HEPARIN SOD (PORK) LOCK FLUSH 100 UNIT/ML IV SOLN
500.0000 [IU] | Freq: Every day | INTRAVENOUS | Status: DC | PRN
  Filled 2020-09-28: qty 5

## 2020-09-28 MED ORDER — DIPHENHYDRAMINE HCL 25 MG PO CAPS: 25.0000 mg | ORAL_CAPSULE | Freq: Once | ORAL | Status: DC

## 2020-09-28 NOTE — Patient Instructions (Signed)
[facility]   °Discharge Instructions: °Thank you for choosing [facility] to provide your oncology and hematology care.  ° °If you have a lab appointment with the Cancer Center, please go directly to the Cancer Center and check in at the registration area. °  °Wear comfortable clothing and clothing appropriate for easy access to any Portacath or PICC line.  ° °We strive to give you quality time with your provider. You may need to reschedule your appointment if you arrive late (15 or more minutes).  Arriving late affects you and other patients whose appointments are after yours.  Also, if you miss three or more appointments without notifying the office, you may be dismissed from the clinic at the provider’s discretion.    °  °For prescription refill requests, have your pharmacy contact our office and allow 72 hours for refills to be completed.   ° °Today you received the following chemotherapy and/or immunotherapy agents: avastin  °  °To help prevent nausea and vomiting after your treatment, we encourage you to take your nausea medication as directed. ° °BELOW ARE SYMPTOMS THAT SHOULD BE REPORTED IMMEDIATELY: °*FEVER GREATER THAN 100.4 F (38 °C) OR HIGHER °*CHILLS OR SWEATING °*NAUSEA AND VOMITING THAT IS NOT CONTROLLED WITH YOUR NAUSEA MEDICATION °*UNUSUAL SHORTNESS OF BREATH °*UNUSUAL BRUISING OR BLEEDING °*URINARY PROBLEMS (pain or burning when urinating, or frequent urination) °*BOWEL PROBLEMS (unusual diarrhea, constipation, pain near the anus) °TENDERNESS IN MOUTH AND THROAT WITH OR WITHOUT PRESENCE OF ULCERS (sore throat, sores in mouth, or a toothache) °UNUSUAL RASH, SWELLING OR PAIN  °UNUSUAL VAGINAL DISCHARGE OR ITCHING  ° °Items with * indicate a potential emergency and should be followed up as soon as possible or go to the Emergency Department if any problems should occur. ° °Please show the CHEMOTHERAPY ALERT CARD or IMMUNOTHERAPY ALERT CARD at check-in to  the Emergency Department and triage nurse. ° °Should you have questions after your visit or need to cancel or reschedule your appointment, please contact [facility]  Dept: [phone]  and follow the prompts.  Office hours are 8:00 a.m. to 4:30 p.m. Monday - Friday. Please note that voicemails left after 4:00 p.m. may not be returned until the following business day.  We are closed weekends and major holidays. You have access to a nurse at all times for urgent questions. Please call the main number to the clinic Dept: [phone] and follow the prompts. ° ° °For any non-urgent questions, you may also contact your provider using MyChart. We now offer e-Visits for anyone 18 and older to request care online for non-urgent symptoms. For details visit [email or web address]. °  °Also download the MyChart app! Go to the app store, search "MyChart", open the app, select , and log in with your MyChart username and password. ° °Due to Covid, a mask is required upon entering the hospital/clinic. If you do not have a mask, one will be given to you upon arrival. For doctor visits, patients may have 1 support person aged 18 or older with them. For treatment visits, patients cannot have anyone with them due to current Covid guidelines and our immunocompromised population.  ° °

## 2020-09-28 NOTE — Progress Notes (Signed)
Per Dr Burr Medico ok to proceed with avastin today with urine protein from 6/1.

## 2020-09-29 LAB — BPAM RBC
Blood Product Expiration Date: 202207022359
ISSUE DATE / TIME: 202206161429
Unit Type and Rh: 600

## 2020-09-29 LAB — TYPE AND SCREEN
ABO/RH(D): A NEG
Antibody Screen: NEGATIVE
Unit division: 0

## 2020-09-30 LAB — URINE CULTURE

## 2020-10-02 ENCOUNTER — Telehealth: Payer: Self-pay | Admitting: Behavioral Health

## 2020-10-02 NOTE — Telephone Encounter (Signed)
Contacted Pt for telehealth visit today. Pt did not answer. Lft msg for her to r/s @ her convenience.  Dr. Theodis Shove

## 2020-10-03 ENCOUNTER — Telehealth: Payer: Self-pay | Admitting: *Deleted

## 2020-10-03 NOTE — Telephone Encounter (Signed)
Left message for pt to return call regarding lab results.  

## 2020-10-03 NOTE — Telephone Encounter (Signed)
-----   Message from Alla Feeling, NP sent at 10/02/2020  7:29 AM EDT ----- Please let her know UA normalized, and culture suggests likely contamination. I feel her previous UTI resolved, and I do not think her back pain is related. Please have her f/up PCP and ortho for back pain. She can stop antibiotics now.  Thanks, Regan Rakers, NP

## 2020-10-05 ENCOUNTER — Ambulatory Visit: Payer: Self-pay | Admitting: Hematology

## 2020-10-05 ENCOUNTER — Other Ambulatory Visit: Payer: Self-pay

## 2020-10-05 ENCOUNTER — Ambulatory Visit: Payer: Self-pay

## 2020-10-06 ENCOUNTER — Encounter: Payer: Self-pay | Admitting: Hematology

## 2020-10-06 ENCOUNTER — Telehealth: Payer: Self-pay | Admitting: *Deleted

## 2020-10-06 NOTE — Telephone Encounter (Signed)
Reached pt & message from Cira Rue NP given.  Pt had one dose of her Cipro left.  She may just go ahead & finish.  Times for next appt given.

## 2020-10-12 ENCOUNTER — Other Ambulatory Visit: Payer: Self-pay

## 2020-10-12 ENCOUNTER — Encounter: Payer: Self-pay | Admitting: Hematology

## 2020-10-12 ENCOUNTER — Inpatient Hospital Stay: Payer: Medicaid Other

## 2020-10-12 ENCOUNTER — Inpatient Hospital Stay (HOSPITAL_BASED_OUTPATIENT_CLINIC_OR_DEPARTMENT_OTHER): Payer: Medicaid Other | Admitting: Hematology

## 2020-10-12 ENCOUNTER — Telehealth: Payer: Self-pay

## 2020-10-12 VITALS — BP 130/61 | HR 54 | Temp 98.7°F | Resp 16

## 2020-10-12 VITALS — BP 122/57 | HR 63 | Temp 97.9°F | Resp 18 | Ht 66.0 in | Wt 252.8 lb

## 2020-10-12 DIAGNOSIS — D649 Anemia, unspecified: Secondary | ICD-10-CM

## 2020-10-12 DIAGNOSIS — Z5112 Encounter for antineoplastic immunotherapy: Secondary | ICD-10-CM | POA: Diagnosis not present

## 2020-10-12 DIAGNOSIS — Z95828 Presence of other vascular implants and grafts: Secondary | ICD-10-CM

## 2020-10-12 DIAGNOSIS — D500 Iron deficiency anemia secondary to blood loss (chronic): Secondary | ICD-10-CM

## 2020-10-12 DIAGNOSIS — I780 Hereditary hemorrhagic telangiectasia: Secondary | ICD-10-CM

## 2020-10-12 DIAGNOSIS — R808 Other proteinuria: Secondary | ICD-10-CM

## 2020-10-12 LAB — CBC WITH DIFFERENTIAL (CANCER CENTER ONLY)
Abs Immature Granulocytes: 0.04 10*3/uL (ref 0.00–0.07)
Basophils Absolute: 0.1 10*3/uL (ref 0.0–0.1)
Basophils Relative: 1 %
Eosinophils Absolute: 0.2 10*3/uL (ref 0.0–0.5)
Eosinophils Relative: 2 %
HCT: 18.8 % — ABNORMAL LOW (ref 36.0–46.0)
Hemoglobin: 5.4 g/dL — CL (ref 12.0–15.0)
Immature Granulocytes: 0 %
Lymphocytes Relative: 16 %
Lymphs Abs: 1.6 10*3/uL (ref 0.7–4.0)
MCH: 25.5 pg — ABNORMAL LOW (ref 26.0–34.0)
MCHC: 28.7 g/dL — ABNORMAL LOW (ref 30.0–36.0)
MCV: 88.7 fL (ref 80.0–100.0)
Monocytes Absolute: 0.6 10*3/uL (ref 0.1–1.0)
Monocytes Relative: 6 %
Neutro Abs: 7.5 10*3/uL (ref 1.7–7.7)
Neutrophils Relative %: 75 %
Platelet Count: 256 10*3/uL (ref 150–400)
RBC: 2.12 MIL/uL — ABNORMAL LOW (ref 3.87–5.11)
RDW: 22.3 % — ABNORMAL HIGH (ref 11.5–15.5)
WBC Count: 10 10*3/uL (ref 4.0–10.5)
nRBC: 0 % (ref 0.0–0.2)

## 2020-10-12 LAB — CMP (CANCER CENTER ONLY)
ALT: <6 U/L (ref 0–44)
AST: 12 U/L — ABNORMAL LOW (ref 15–41)
Albumin: 2.9 g/dL — ABNORMAL LOW (ref 3.5–5.0)
Alkaline Phosphatase: 66 U/L (ref 38–126)
Anion gap: 7 (ref 5–15)
BUN: 9 mg/dL (ref 6–20)
CO2: 25 mmol/L (ref 22–32)
Calcium: 8.7 mg/dL — ABNORMAL LOW (ref 8.9–10.3)
Chloride: 110 mmol/L (ref 98–111)
Creatinine: 0.86 mg/dL (ref 0.44–1.00)
GFR, Estimated: >60 mL/min (ref >60)
Glucose, Bld: 100 mg/dL — ABNORMAL HIGH (ref 70–99)
Potassium: 3.6 mmol/L (ref 3.5–5.1)
Sodium: 142 mmol/L (ref 135–145)
Total Bilirubin: <0.2 mg/dL — ABNORMAL LOW (ref 0.3–1.2)
Total Protein: 6.4 g/dL — ABNORMAL LOW (ref 6.5–8.1)

## 2020-10-12 LAB — IRON AND TIBC
Iron: 32 ug/dL (ref 28–170)
Saturation Ratios: 11 % (ref 10.4–31.8)
TIBC: 289 ug/dL (ref 250–450)
UIBC: 257 ug/dL

## 2020-10-12 LAB — PREPARE RBC (CROSSMATCH)

## 2020-10-12 LAB — TOTAL PROTEIN, URINE DIPSTICK: Protein, ur: 100 mg/dL — AB

## 2020-10-12 LAB — SAMPLE TO BLOOD BANK

## 2020-10-12 LAB — FERRITIN: Ferritin: 45 ng/mL (ref 11–307)

## 2020-10-12 MED ORDER — SODIUM CHLORIDE 0.9 % IV SOLN
Freq: Once | INTRAVENOUS | Status: AC
  Filled 2020-10-12: qty 250

## 2020-10-12 MED ORDER — HEPARIN SOD (PORK) LOCK FLUSH 100 UNIT/ML IV SOLN
500.0000 [IU] | Freq: Once | INTRAVENOUS | Status: AC | PRN
  Administered 2020-10-12: 500 [IU]
  Filled 2020-10-12: qty 5

## 2020-10-12 MED ORDER — SODIUM CHLORIDE 0.9% FLUSH
10.0000 mL | INTRAVENOUS | Status: DC | PRN
  Administered 2020-10-12: 10 mL
  Filled 2020-10-12: qty 10

## 2020-10-12 MED ORDER — SODIUM CHLORIDE 0.9 % IV SOLN
5.0000 mg/kg | Freq: Once | INTRAVENOUS | Status: AC
  Administered 2020-10-12: 600 mg via INTRAVENOUS
  Filled 2020-10-12: qty 16

## 2020-10-12 MED ORDER — SODIUM CHLORIDE 0.9 % IV SOLN
250.0000 mg | Freq: Once | INTRAVENOUS | Status: AC
  Administered 2020-10-12: 250 mg via INTRAVENOUS
  Filled 2020-10-12: qty 20

## 2020-10-12 NOTE — Progress Notes (Signed)
Critical Value Hgb 5.4 Dr Burr Medico notified Pt to receive 2 Units of RBC  Per Dr Burr Medico ok to give Avastin today without urine prt level.

## 2020-10-12 NOTE — Telephone Encounter (Signed)
CRITICAL VALUE STICKER  CRITICAL VALUE: Hgb = 5.4  RECEIVER (on-site recipient of call): Yetta Glassman, CMA  DATE & TIME NOTIFIED: 10/12/20 at [place] (representative from lab): Lelan Pons  MD NOTIFIED: Dr. Burr Medico  TIME OF NOTIFICATION: 10/12/20 at 1425  RESPONSE: Notification given to Tollie Pizza, RN for follow-up with the provider and pt.

## 2020-10-12 NOTE — Progress Notes (Signed)
Ok to treat with hbg of 5.4 per Dr.Feng

## 2020-10-12 NOTE — Progress Notes (Signed)
Pt agreeable to go to [facility] for transfusion tomorrow at 0830.  I have arranged for out transportation service to pick her up at her home address tomorrows at 0750.  She has been notified.

## 2020-10-12 NOTE — Progress Notes (Signed)
Patient observed 30 minutes after iron with no complaints, vitals stable and patient in no distress.

## 2020-10-12 NOTE — Patient Instructions (Signed)
[facility]   °Discharge Instructions: °Thank you for choosing [facility] to provide your oncology and hematology care.  ° °If you have a lab appointment with the Cancer Center, please go directly to the Cancer Center and check in at the registration area. °  °Wear comfortable clothing and clothing appropriate for easy access to any Portacath or PICC line.  ° °We strive to give you quality time with your provider. You may need to reschedule your appointment if you arrive late (15 or more minutes).  Arriving late affects you and other patients whose appointments are after yours.  Also, if you miss three or more appointments without notifying the office, you may be dismissed from the clinic at the provider’s discretion.    °  °For prescription refill requests, have your pharmacy contact our office and allow 72 hours for refills to be completed.   ° °Today you received the following chemotherapy and/or immunotherapy agents: avastin  °  °To help prevent nausea and vomiting after your treatment, we encourage you to take your nausea medication as directed. ° °BELOW ARE SYMPTOMS THAT SHOULD BE REPORTED IMMEDIATELY: °*FEVER GREATER THAN 100.4 F (38 °C) OR HIGHER °*CHILLS OR SWEATING °*NAUSEA AND VOMITING THAT IS NOT CONTROLLED WITH YOUR NAUSEA MEDICATION °*UNUSUAL SHORTNESS OF BREATH °*UNUSUAL BRUISING OR BLEEDING °*URINARY PROBLEMS (pain or burning when urinating, or frequent urination) °*BOWEL PROBLEMS (unusual diarrhea, constipation, pain near the anus) °TENDERNESS IN MOUTH AND THROAT WITH OR WITHOUT PRESENCE OF ULCERS (sore throat, sores in mouth, or a toothache) °UNUSUAL RASH, SWELLING OR PAIN  °UNUSUAL VAGINAL DISCHARGE OR ITCHING  ° °Items with * indicate a potential emergency and should be followed up as soon as possible or go to the Emergency Department if any problems should occur. ° °Please show the CHEMOTHERAPY ALERT CARD or IMMUNOTHERAPY ALERT CARD at check-in to  the Emergency Department and triage nurse. ° °Should you have questions after your visit or need to cancel or reschedule your appointment, please contact [facility]  Dept: [phone]  and follow the prompts.  Office hours are 8:00 a.m. to 4:30 p.m. Monday - Friday. Please note that voicemails left after 4:00 p.m. may not be returned until the following business day.  We are closed weekends and major holidays. You have access to a nurse at all times for urgent questions. Please call the main number to the clinic Dept: [phone] and follow the prompts. ° ° °For any non-urgent questions, you may also contact your provider using MyChart. We now offer e-Visits for anyone 18 and older to request care online for non-urgent symptoms. For details visit [email or web address]. °  °Also download the MyChart app! Go to the app store, search "MyChart", open the app, select [facility], and log in with your MyChart username and password. ° °Due to Covid, a mask is required upon entering the hospital/clinic. If you do not have a mask, one will be given to you upon arrival. For doctor visits, patients may have 1 support person aged 18 or older with them. For treatment visits, patients cannot have anyone with them due to current Covid guidelines and our immunocompromised population.  ° °

## 2020-10-12 NOTE — Progress Notes (Signed)
[facility]   [phone] [phone]   Clinic Follow up Note   Patient Care Team: [name], MD as PCP - General (Internal Medicine)  Date of Service:  10/12/2020  CHIEF COMPLAINT: f/u of HHT and anemia  PREVIOUS THERAPY: -Multiple EGD, small bowel enteroscope, C-scope with APC -Oral and iv amicar were given during hospital stay, no effect  -IR embolization of of the left gastric and short gastric artery branch vessels without evidence of residual flow in the Dieulafoy lesion on 12/10/17 -Avastin on 8/2 and 8/30 at [facility]; starting 12/26/17 continue 5 mg/kg q2 weeks until 02/20/18, restarted on 04/03/2018  -Tamoxifen started in 10/2017 for [place] stopped 12/10/18 due to DVT  CURRENT THERAPY:  -Blood transfusion for severe anemia secondary to GI bleeding as needed  -iv ferric gluconate every 1-2 weeks as needed with ferritin goal 100-200. Increased to weekly on 07/29/19. Reduced to every 2 weeks on 11/12/19  -Restarted Avastin/Zirabev q2weeks on 04/03/18. Reduced to every 4 weeks starting 09/25/19. Increased to every 2 weeks on 01/06/20. Increased dose to 250mg  on 01/20/20 -Amicar 1g bid starting 07/17/2018. Reduced to only as needed on 12/10/18 due to DVT. Restart daily on 06/16/20 due to increased bleeding. Increased to BID, then TID on 07/07/20.  INTERVAL HISTORY:  Peggy House is here for a follow up of HHT and anemia. She was last seen by me on 08/03/20, and by NP Lacie on 08/24/20 and 09/27/20 in the interim. She presents to the clinic alone. She reports a significant nose bleed yesterday morning that went on for a long amount of time. Because of this, she is very fatigued today. She notes continued leg swelling.  All other systems were reviewed with the patient and are negative.  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    SURGICAL HISTORY: Past Surgical History:  Procedure Laterality Date   ABDOMINAL HYSTERECTOMY     CARPAL TUNNEL RELEASE  05/13/2011   Procedure: CARPAL TUNNEL RELEASE;  Surgeon: Nita Sells, MD;  Location: [facility];  Service: Orthopedics;  Laterality: Left;   COLONOSCOPY N/A 03/02/2020   Procedure: COLONOSCOPY;  Surgeon: Ronald Lobo, MD;  Location: [facility];  Service: Endoscopy;  Laterality: N/A;   COLONOSCOPY WITH PROPOFOL N/A 04/28/2014   Procedure: COLONOSCOPY WITH PROPOFOL;  Surgeon: Cleotis Nipper, MD;  Location: [facility] ENDOSCOPY;  Service: Endoscopy;  Laterality: N/A;   DG TOES*L*  2/10   rt   DILATION AND CURETTAGE OF UTERUS     ENTEROSCOPY N/A 10/17/2017   Procedure: ENTEROSCOPY;  Surgeon: Otis Brace, MD;  Location: [facility];  Service: Gastroenterology;  Laterality: N/A;   ESOPHAGOGASTRODUODENOSCOPY N/A 04/10/2014   Procedure: ESOPHAGOGASTRODUODENOSCOPY (EGD);  Surgeon: [name], MD;  Location: [facility] ENDOSCOPY;  Service: Endoscopy;  Laterality: N/A;   ESOPHAGOGASTRODUODENOSCOPY N/A 05/10/2017   Procedure: ESOPHAGOGASTRODUODENOSCOPY (EGD);  Surgeon: Ronald Lobo, MD;  Location: [facility] END[facility]COPY;  Service: Endoscopy;  Laterality: N/A;   ESOPHAGOGASTRODUODENOSCOPY N/A 09/22/2017   Procedure: ESOPHAGOGASTRODUODENOSCOPY (EGD);  Surgeon: Clarene Essex, MD;  Location: [facility];  Service: Endoscopy;  Laterality: N/A;  bedside   ESOPHAGOGASTRODUODENOSCOPY N/A 03/02/2020   Procedure: ESOPHAGOGASTRODUODENOSCOPY (EGD);  Surgeon: Ronald Lobo, MD;  Location: [facility];  Service: Endoscopy;  Laterality: N/A;   ESOPHAGOGASTRODUODENOSCOPY (EGD) WITH PROPOFOL N/A 04/27/2014   Procedure:  ESOPHAGOGASTRODUODENOSCOPY (EGD) WITH PROPOFOL;  Surgeon: Cleotis Nipper, MD;  Location: [facility];  Service: Endoscopy;  Laterality: N/A;  possible apc   ESOPHAGOGASTRODUODENOSCOPY (EGD) WITH PROPOFOL N/A 09/30/2017   Procedure: ESOPHAGOGASTRODUODENOSCOPY (EGD) WITH PROPOFOL;  Surgeon: [name], MD;  Location: [facility];  Service: Gastroenterology;  Laterality: N/A;   ESOPHAGOGASTRODUODENOSCOPY (EGD) WITH PROPOFOL N/A 10/01/2017   Procedure: ESOPHAGOGASTRODUODENOSCOPY (EGD) WITH PROPOFOL;  Surgeon: [name], MD;  Location: [facility];  Service: Gastroenterology;  Laterality: N/A;   ESOPHAGOGASTRODUODENOSCOPY (EGD) WITH PROPOFOL N/A 10/08/2017   Procedure: ESOPHAGOGASTRODUODENOSCOPY (EGD) WITH PROPOFOL;  Surgeon: Otis Brace, MD;  Location: [facility];  Service: Gastroenterology;  Laterality: N/A;   ESOPHAGOGASTRODUODENOSCOPY (EGD) WITH PROPOFOL N/A 10/17/2017   Procedure: ESOPHAGOGASTRODUODENOSCOPY (EGD) WITH PROPOFOL;  Surgeon: Otis Brace, MD;  Location: [facility] [place];  Service: Gastroenterology;  Laterality: N/A;   ESOPHAGOGASTRODUODENOSCOPY (EGD) WITH PROPOFOL N/A 10/19/2017   Procedure: ESOPHAGOGASTRODUODENOSCOPY (EGD) WITH PROPOFOL;  Surgeon: Otis Brace, MD;  Location: [place];  Service: Gastroenterology;  Laterality: N/A;   ESOPHAGOGASTRODUODENOSCOPY (EGD) WITH PROPOFOL N/A 12/04/2018   Procedure: ESOPHAGOGASTRODUODENOSCOPY (EGD) WITH PROPOFOL;  Surgeon: Wilford Corner, MD;  Location: [facility];  Service: Endoscopy;  Laterality: N/A;   GIVENS CAPSULE STUDY N/A 10/02/2017   Procedure: GIVENS CAPSULE STUDY;  Surgeon: [name], MD;  Location: [place] [facility];  Service: Gastroenterology;  Laterality: N/A;   GIVENS CAPSULE STUDY N/A 10/08/2017   Procedure: GIVENS CAPSULE STUDY;  Surgeon: Otis Brace, MD;  Location: [facility];  Service: Gastroenterology;  Laterality: N/A;  endoscopic placement of capsule   GIVENS CAPSULE STUDY N/A 03/02/2020    Procedure: GIVENS CAPSULE STUDY;  Surgeon: Ronald Lobo, MD;  Location: [facility];  Service: Endoscopy;  Laterality: N/A;   HEMOSTASIS CLIP PLACEMENT  12/04/2018   Procedure: HEMOSTASIS CLIP PLACEMENT;  Surgeon: Wilford Corner, MD;  Location: [facility];  Service: Endoscopy;;   HOT HEMOSTASIS N/A 04/27/2014   Procedure: HOT HEMOSTASIS (ARGON PLASMA COAGULATION/BICAP);  Surgeon: Cleotis Nipper, MD;  Location: [facility] ENDOSCOPY;  Service: Endoscopy;  Laterality: N/A;   HOT HEMOSTASIS N/A 09/30/2017   Procedure: HOT HEMOSTASIS (ARGON PLASMA COAGULATION/BICAP);  Surgeon: [name], MD;  Location: [place];  Service: Gastroenterology;  Laterality: N/A;   HOT HEMOSTASIS N/A 10/01/2017   Procedure: HOT HEMOSTASIS (ARGON PLASMA COAGULATION/BICAP);  Surgeon: [name], MD;  Location: [facility];  Service: Gastroenterology;  Laterality: N/A;   HOT HEMOSTASIS N/A 10/17/2017   Procedure: HOT HEMOSTASIS (ARGON PLASMA COAGULATION/BICAP);  Surgeon: Otis Brace, MD;  Location: [facility]COPY;  Service: Gastroenterology;  Laterality: N/A;   HOT HEMOSTASIS N/A 10/19/2017   Procedure: HOT HEMOSTASIS (ARGON PLASMA COAGULATION/BICAP);  Surgeon: Otis Brace, MD;  Location: [facility] ENDOSCOPY;  Service: Gastroenterology;  Laterality: N/A;   HOT HEMOSTASIS N/A 03/02/2020   Procedure: HOT HEMOSTASIS (ARGON PLASMA COAGULATION/BICAP);  Surgeon: Ronald Lobo, MD;  Location: [facility];  Service: Endoscopy;  Laterality: N/A;   IR IMAGING GUIDED PORT INSERTION  07/08/2018   L shoulder Surgery  2011   POLYPECTOMY  03/02/2020   Procedure: POLYPECTOMY;  Surgeon: Ronald Lobo, MD;  Location: [facility];  Service: Endoscopy;;   SUBMUCOSAL INJECTION  09/22/2017   Procedure: SUBMUCOSAL INJECTION;  Surgeon: Clarene Essex, MD;  Location: [facility];  Service: Endoscopy;;   SUBMUCOSAL INJECTION  12/04/2018  Procedure: SUBMUCOSAL INJECTION;  Surgeon: Wilford Corner, MD;  Location: [facility];  Service:  Endoscopy;;    I have reviewed the social history and family history with the patient and they are unchanged from previous note.  ALLERGIES:  is allergic to feraheme [ferumoxytol], nsaids, tomato, iron (ferrous sulfate) [ferrous sulfate er], other, and wasp venom.  MEDICATIONS:  Current Outpatient Medications  Medication Sig Dispense Refill   Accu-Chek Softclix Lancets lancets Use to check blood sugar before breakfast and before dinner while on steroids 100 each 1   acitretin (SORIATANE) 10 MG capsule Take 10 mg by mouth daily.     albuterol (VENTOLIN HFA) 108 (90 Base) MCG/ACT inhaler Inhale 1-2 puffs into the lungs every 6 (six) hours as needed for wheezing or shortness of breath. 1 each 1   ALPRAZolam (XANAX) 0.5 MG tablet Take 1 tablet (0.5 mg total) by mouth at bedtime as needed for anxiety. 30 tablet 0   Aminocaproic Acid 1000 MG TABS Take 1 tablet (1,000 mg total) by mouth in the morning and at bedtime. TAKE 1 TABLET(1000 MG) BY MOUTH TWICE DAILY 60 tablet 3   amLODipine (NORVASC) 10 MG tablet TAKE 1 TABLET(10 MG) BY MOUTH DAILY 90 tablet 0   atorvastatin (LIPITOR) 80 MG tablet TAKE 1 TABLET(80 MG) BY MOUTH DAILY (Patient taking differently: Take 80 mg by mouth daily. TAKE 1 TABLET(80 MG) BY MOUTH DAILY) 30 tablet 3   cetirizine (ZYRTEC) 10 MG tablet TAKE 1 TABLET(10 MG) BY MOUTH DAILY 90 tablet 1   ciprofloxacin (CIPRO) 500 MG tablet Take 1 tablet (500 mg total) by mouth 2 (two) times daily. 14 tablet 0   diphenoxylate-atropine (LOMOTIL) 2.5-0.025 MG tablet 1 to 2 PO QID prn diarrhea (Patient taking differently: Take 1-2 tablets by mouth 4 (four) times daily as needed for diarrhea or loose stools.) 30 tablet 1   ferrous gluconate (FERGON) 324 MG tablet 1 tablet     furosemide (LASIX) 40 MG tablet 1 tablet     glucose blood (ACCU-CHEK GUIDE) test strip Check blood sugar 2 times per day while on steroids before breakfast and dinner 50 each 3   hydrochlorothiazide (HYDRODIURIL) 12.5 MG  tablet TAKE 1 TABLET(12.5 MG) BY MOUTH DAILY 90 tablet 1   lidocaine-prilocaine (EMLA) cream Apply 1 application topically as needed. 30 g 0   metFORMIN (GLUCOPHAGE) 500 MG tablet Take 1 tablet (500 mg total) by mouth 2 (two) times daily with a meal. 180 tablet 3   metoprolol succinate (TOPROL-XL) 100 MG 24 hr tablet 1 tablet     ondansetron (ZOFRAN ODT) 8 MG disintegrating tablet 8mg  ODT q4 hours prn nausea 30 tablet 0   pantoprazole (PROTONIX) 40 MG tablet TAKE 1 TABLET(40 MG) BY MOUTH TWICE DAILY 60 tablet 5   Podiatric Products (FLEXITOL HEEL BALM) OINT Apply 1 application topically as needed (dry skin).      potassium chloride (KLOR-CON) 20 MEQ packet Take 20 mEq by mouth daily. 30 packet 1   prochlorperazine (COMPAZINE) 10 MG tablet Take 1 tablet (10 mg total) by mouth every 6 (six) hours as needed for nausea or vomiting. 30 tablet 1   sertraline (ZOLOFT) 100 MG tablet TAKE 1 TABLET(100 MG) BY MOUTH DAILY (Patient taking differently: Take 100 mg by mouth daily.) 90 tablet 1   sertraline (ZOLOFT) 25 MG tablet Take 1 tablet (25 mg total) by mouth daily. 30 tablet 2   terbinafine (LAMISIL AT) 1 % cream Apply 1 application topically 2 (two) times daily. Both bottom  and top of both feet and toes 36 g 0   traMADol (ULTRAM) 50 MG tablet Take 1 tablet (50 mg total) by mouth daily as needed for severe pain. 7 tablet 0   traZODone (DESYREL) 100 MG tablet TAKE 1 TABLET(100 MG) BY MOUTH AT BEDTIME 30 tablet 2   No current facility-administered medications for this visit.   Facility-Administered Medications Ordered in Other Visits  Medication Dose Route Frequency Provider Last Rate Last Admin   bevacizumab-bvzr (ZIRABEV) 600 mg in sodium chloride 0.9 % 100 mL chemo infusion  5 mg/kg (Treatment Plan Recorded) Intravenous Once Truitt Merle, MD       heparin lock flush 100 unit/mL  500 Units Intracatheter Once PRN Truitt Merle, MD       sodium chloride flush (NS) 0.9 % injection 10 mL  10 mL Intracatheter PRN  Truitt Merle, MD   10 mL at 08/14/20 1725   sodium chloride flush (NS) 0.9 % injection 10 mL  10 mL Intracatheter PRN Truitt Merle, MD        PHYSICAL EXAMINATION: ECOG PERFORMANCE STATUS: 3 - Symptomatic, >50% confined to bed  Vitals:   10/12/20 1412  BP: (!) 122/57  Pulse: 63  Resp: 18  Temp: 97.9 F (36.6 C)  SpO2: 100%   Filed Weights   10/12/20 1412  Weight: 252 lb 12.8 oz (114.7 kg)    GENERAL:alert, no distress and comfortable SKIN: skin color, texture, turgor are normal, no rashes or significant lesions EYES: normal, Conjunctiva are pink and non-injected, sclera clear  NECK: supple, thyroid normal size, non-tender, without nodularity LYMPH:  no palpable lymphadenopathy in the cervical, axillary  LUNGS: clear to auscultation and percussion with normal breathing effort HEART: regular rate & rhythm and no murmurs and no lower extremity edema ABDOMEN:abdomen soft, non-tender and normal bowel sounds Musculoskeletal:no cyanosis of digits and no clubbing  NEURO: alert & oriented x 3 with fluent speech, no focal motor/sensory deficits  LABORATORY DATA:  I have reviewed the data as listed CBC Latest Ref Rng & Units 10/12/2020 09/27/2020 09/22/2020  WBC 4.0 - 10.5 K/uL 10.0 12.9(H) 12.3(H)  Hemoglobin 12.0 - 15.0 g/dL 5.4(LL) 6.7(LL) 7.6(L)  Hematocrit 36.0 - 46.0 % 18.8(L) 23.9(L) 25.8(L)  Platelets 150 - 400 K/uL 256 278 416(H)     CMP Latest Ref Rng & Units 10/12/2020 09/27/2020 09/13/2020  Glucose 70 - 99 mg/dL 100(H) 112(H) 159(H)  BUN 6 - 20 mg/dL 9 12 11   Creatinine 0.44 - 1.00 mg/dL 0.86 0.82 1.03(H)  Sodium 135 - 145 mmol/L 142 142 139  Potassium 3.5 - 5.1 mmol/L 3.6 3.8 3.4(L)  Chloride 98 - 111 mmol/L 110 110 104  CO2 22 - 32 mmol/L 25 23 23   Calcium 8.9 - 10.3 mg/dL 8.7(L) 8.9 9.3  Total Protein 6.5 - 8.1 g/dL 6.4(L) 6.6 7.5  Total Bilirubin 0.3 - 1.2 mg/dL <0.2(L) <0.2(L) 0.6  Alkaline Phos 38 - 126 U/L 66 70 70  AST 15 - 41 U/L 12(L) 14(L) 19  ALT 0 - 44 U/L 6  12 11       RADIOGRAPHIC STUDIES: I have personally reviewed the radiological images as listed and agreed with the findings in the report. No results found.   ASSESSMENT & PLAN:  Peggy House is a 60 y.o. female with   1. 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  -She had recurrent severe epistaxis recently. Due to the blood loss, her hgb today is 5.4 (10/12/20). I recommended she proceed with IV iron today and next week, as well as blood transfusion on Saturday.  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 -Pt was hospitalized several times from 1-10/2017 for severe recurrent GI bleeding from AVMs which required multiple blood transfusions and APCs. 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  -She is s/p IR embolization of the left Gastric and short gastric artery branch vessels without evidence of residual flow in the Dieulafoy lesion on 12/10/2017.  -She required cauterization for stomach for severe Gi bleeding in 11/2018 -She has been on bevacizumab every 2-4 week, treatment has been intermittently held due to large proteinuria.   3. B/l LE Edema -Stable. Continue compression socks and leg elevation  -[place] of leg found lymphadenopathy of b/l groin. She does have skin darkening of her legs which she has had in recent months.    4. Smoking cessation -She was smoking 10 cigarettes a day on average before recent hospitalization. She is smoking about 1/2 ppd (as of 08/03/20) -I strongly encouraged her to  stop smoking completely. She tried previously but had trouble.   5. HTN, uncontrolled, Recently diagnosed DM -Continue medications. Will monitor on Avastin.   -Continue to f/u with PCP     PLAN:  -Proceed with IV Ferric Gluconate 250mg  and Beva today -Scheduled 2u blood transfusion for tomorrow at [facility] -flush, labs and Ferric Gluconate 250mg  (unless Ferritin >200 on last lab) twice a week next week then weekly -f/u in 3 weeks   No problem-specific Assessment & Plan notes found for this encounter.   No orders of the defined types were placed in this encounter.  All questions were answered. The patient knows to call the clinic with any problems, questions or concerns. No barriers to learning was detected. The total time spent in the appointment was 30 minutes.     Truitt Merle, MD 10/12/2020   I, Wilburn Mylar, am acting as scribe for Truitt Merle, MD.   I have reviewed the above documentation for accuracy and completeness, and I agree with the above.

## 2020-10-13 ENCOUNTER — Inpatient Hospital Stay: Payer: Medicaid Other | Attending: Hematology

## 2020-10-13 ENCOUNTER — Telehealth: Payer: Self-pay

## 2020-10-13 DIAGNOSIS — D500 Iron deficiency anemia secondary to blood loss (chronic): Secondary | ICD-10-CM | POA: Insufficient documentation

## 2020-10-13 DIAGNOSIS — I780 Hereditary hemorrhagic telangiectasia: Secondary | ICD-10-CM | POA: Insufficient documentation

## 2020-10-13 DIAGNOSIS — K922 Gastrointestinal hemorrhage, unspecified: Secondary | ICD-10-CM | POA: Diagnosis not present

## 2020-10-13 DIAGNOSIS — D649 Anemia, unspecified: Secondary | ICD-10-CM

## 2020-10-13 MED ORDER — DIPHENHYDRAMINE HCL 25 MG PO CAPS
25.0000 mg | ORAL_CAPSULE | Freq: Once | ORAL | Status: AC
Start: 2020-10-13 — End: 2020-10-13
  Administered 2020-10-13: 25 mg via ORAL
  Filled 2020-10-13: qty 1

## 2020-10-13 MED ORDER — SODIUM CHLORIDE 0.9% IV SOLUTION
250.0000 mL | Freq: Once | INTRAVENOUS | Status: AC
  Administered 2020-10-13: 250 mL via INTRAVENOUS
  Filled 2020-10-13: qty 250

## 2020-10-13 MED ORDER — ACETAMINOPHEN 325 MG PO TABS
650.0000 mg | ORAL_TABLET | Freq: Once | ORAL | Status: AC
  Administered 2020-10-13: 650 mg via ORAL
  Filled 2020-10-13: qty 2

## 2020-10-13 MED ORDER — HEPARIN SOD (PORK) LOCK FLUSH 100 UNIT/ML IV SOLN
500.0000 [IU] | Freq: Every day | INTRAVENOUS | Status: AC | PRN
  Administered 2020-10-13: 500 [IU]
  Filled 2020-10-13: qty 5

## 2020-10-13 MED ORDER — SODIUM CHLORIDE 0.9% FLUSH
10.0000 mL | INTRAVENOUS | Status: AC | PRN
Start: 2020-10-13 — End: 2020-10-13
  Administered 2020-10-13: 10 mL
  Filled 2020-10-13: qty 10

## 2020-10-13 NOTE — Telephone Encounter (Signed)
Patient not seen for her blood transfusion appointment at 0830. TCT patient multiple times and finally reached her at 0950. Patient stated that she "over slept" and it would take her an hour to be in for her appointment. Informed patient that this will affect her transfusion since we have had the blood sitting in the cooler since 0730 and she is supposed to get 2 units PRBCs. Patient verbalized she does not want 2 units of blood, she only wants 1 unit. Advised patient to come on in for transfusion and that her physician will be made aware of her request.

## 2020-10-13 NOTE — Patient Instructions (Signed)
Blood Transfusion, Adult, Care After This sheet gives you information about how to care for yourself after your procedure. Your doctor may also give you more specific instructions. If youhave problems or questions, contact your doctor. What can I expect after the procedure? After the procedure, it is common to have: Bruising and soreness at the IV site. A fever or chills on the day of the procedure. This may be your body's response to the new blood cells received. A headache. Follow these instructions at home: Insertion site care     Follow instructions from your doctor about how to take care of your insertion site. This is where an IV tube was put into your vein. Make sure you: Wash your hands with soap and water before and after you change your bandage (dressing). If you cannot use soap and water, use hand sanitizer. Change your bandage as told by your doctor. Check your insertion site every day for signs of infection. Check for: Redness, swelling, or pain. Bleeding from the site. Warmth. Pus or a bad smell. General instructions Take over-the-counter and prescription medicines only as told by your doctor. Rest as told by your doctor. Go back to your normal activities as told by your doctor. Keep all follow-up visits as told by your doctor. This is important. Contact a doctor if: You have itching or red, swollen areas of skin (hives). You feel worried or nervous (anxious). You feel weak after doing your normal activities. You have redness, swelling, warmth, or pain around the insertion site. You have blood coming from the insertion site, and the blood does not stop with pressure. You have pus or a bad smell coming from the insertion site. Get help right away if: You have signs of a serious reaction. This may be coming from an allergy or the body's defense system (immune system). Signs include: Trouble breathing or shortness of breath. Swelling of the face or feeling warm  (flushed). Fever or chills. Head, chest, or back pain. Dark pee (urine) or blood in the pee. Widespread rash. Fast heartbeat. Feeling dizzy or light-headed. You may receive your blood transfusion in an outpatient setting. If so, youwill be told whom to contact to report any reactions. These symptoms may be an emergency. Do not wait to see if the symptoms will go away. Get medical help right away. Call your local emergency services (911 in the [place] Do not drive yourself to the hospital. Summary Bruising and soreness at the IV site are common. Check your insertion site every day for signs of infection. Rest as told by your doctor. Go back to your normal activities as told by your doctor. Get help right away if you have signs of a serious reaction. This information is not intended to replace advice given to you by your health care provider. Make sure you discuss any questions you have with your healthcare provider. Document Revised: 09/24/2018 Document Reviewed: 09/24/2018 Elsevier Patient Education  2022 [employer]  

## 2020-10-13 NOTE — Progress Notes (Signed)
Per Dr Burr Medico ok to give just 1 unit of blood today.  Will schedule another unit for next week.

## 2020-10-15 LAB — TYPE AND SCREEN
ABO/RH(D): A NEG
Antibody Screen: NEGATIVE
Unit division: 0
Unit division: 0

## 2020-10-15 LAB — BPAM RBC
Blood Product Expiration Date: 202208022359
Blood Product Expiration Date: 202208042359
ISSUE DATE / TIME: 202207010757
ISSUE DATE / TIME: 202207010757
Unit Type and Rh: 600
Unit Type and Rh: 600

## 2020-10-15 IMAGING — DX CHEST - 2 VIEW
2 series · 2 of 2 positions shown · non-contrast
Comparison: 09/23/2017

CLINICAL DATA: Productive cough, shortness of breath

EXAM:
CHEST - 2 VIEW

[chest pa]
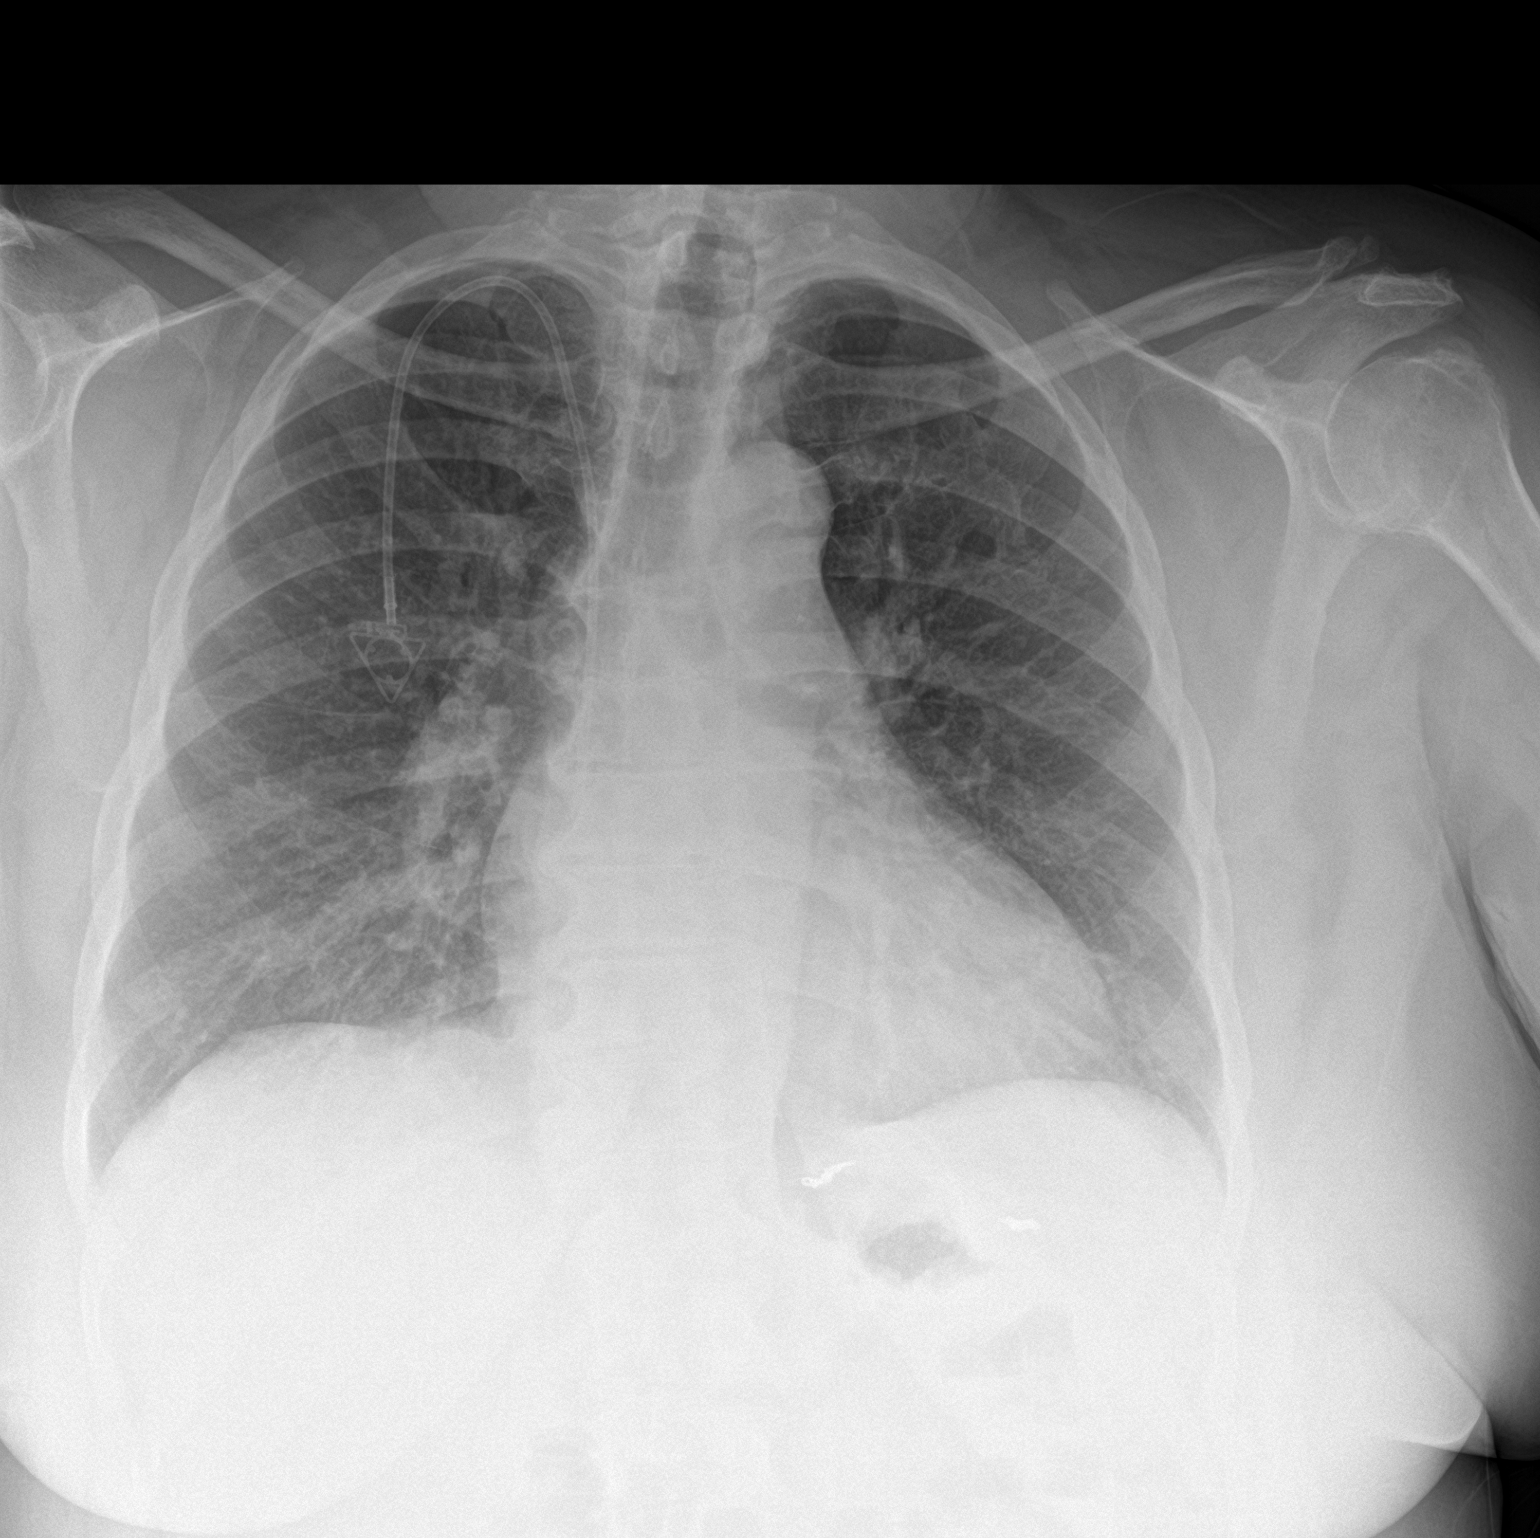

[chest lat]
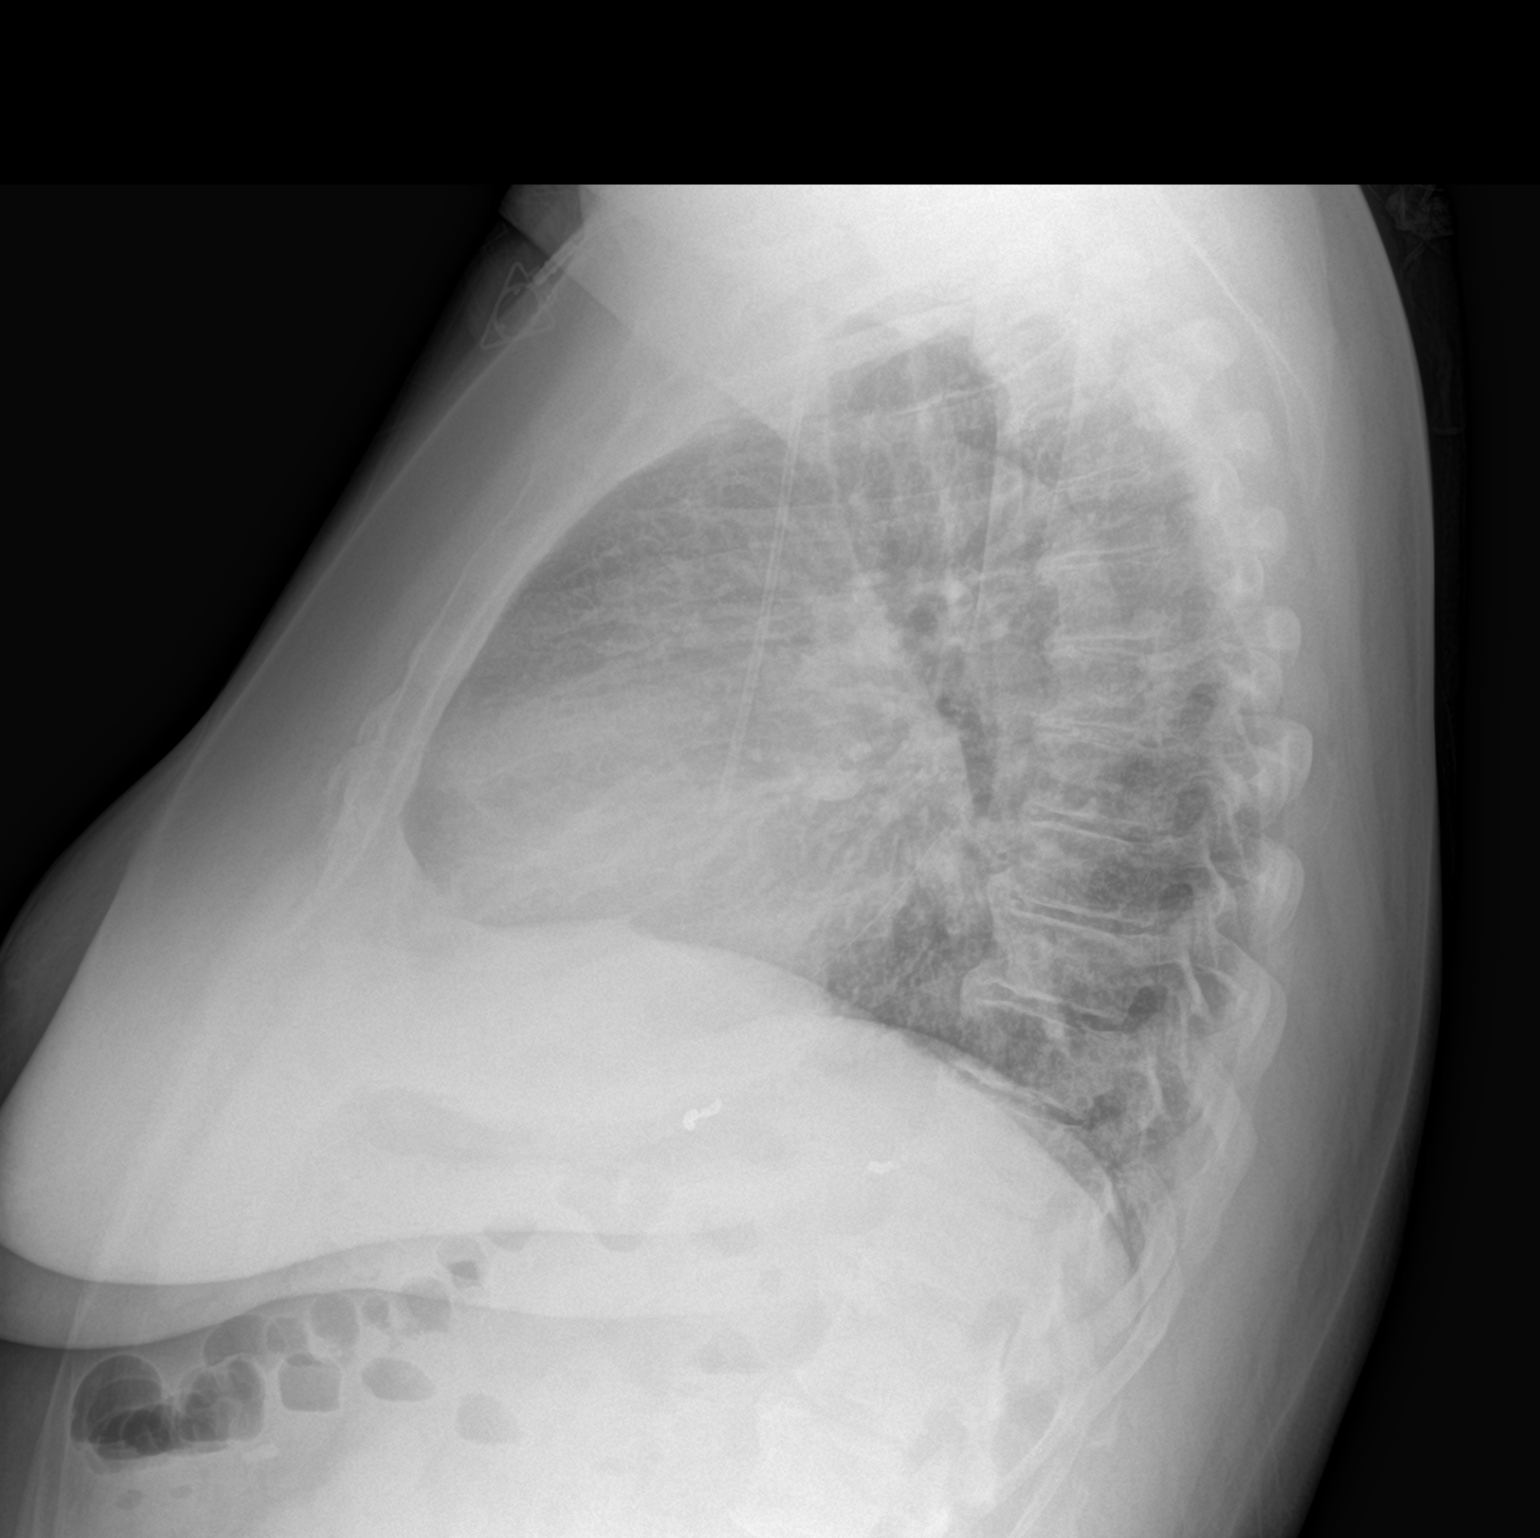

[2 of 2 positions shown; findings below may reference images not displayed]

FINDINGS: Right Port-A-Cath in place with the tip at the cavoatrial junction.
Heart is upper limits normal in size. No confluent opacities or
effusions. No acute bony abnormality.
IMPRESSION: No active cardiopulmonary disease.

## 2020-10-17 ENCOUNTER — Telehealth: Payer: Self-pay

## 2020-10-17 ENCOUNTER — Telehealth: Payer: Self-pay | Admitting: Hematology

## 2020-10-17 NOTE — Telephone Encounter (Signed)
This nurse called patient related to missed transfusion on 10/13/20.  Attempting to reschedule this appt at [facility] or [facility].  Will follow up.

## 2020-10-17 NOTE — Telephone Encounter (Signed)
Blood transfusion appt was scheduled per 7/1 sch msg. Called pt, no answer. Left msg with appt date and time. Also let pt know appt will be at the [facility] location and left address in msg.

## 2020-10-18 ENCOUNTER — Ambulatory Visit: Payer: Medicaid Other | Admitting: Behavioral Health

## 2020-10-18 ENCOUNTER — Telehealth: Payer: Self-pay | Admitting: Hematology

## 2020-10-18 ENCOUNTER — Other Ambulatory Visit: Payer: Medicaid Other

## 2020-10-18 ENCOUNTER — Inpatient Hospital Stay: Payer: Medicaid Other

## 2020-10-18 ENCOUNTER — Telehealth: Payer: Self-pay

## 2020-10-18 ENCOUNTER — Other Ambulatory Visit: Payer: Self-pay

## 2020-10-18 VITALS — BP 140/55 | HR 62 | Temp 98.4°F | Resp 17

## 2020-10-18 DIAGNOSIS — F4321 Adjustment disorder with depressed mood: Secondary | ICD-10-CM

## 2020-10-18 DIAGNOSIS — D500 Iron deficiency anemia secondary to blood loss (chronic): Secondary | ICD-10-CM

## 2020-10-18 DIAGNOSIS — F331 Major depressive disorder, recurrent, moderate: Secondary | ICD-10-CM

## 2020-10-18 DIAGNOSIS — Z95828 Presence of other vascular implants and grafts: Secondary | ICD-10-CM

## 2020-10-18 MED ORDER — SODIUM CHLORIDE 0.9% FLUSH
10.0000 mL | Freq: Once | INTRAVENOUS | Status: AC
Start: 2020-10-18 — End: 2020-10-18
  Administered 2020-10-18: 10 mL
  Filled 2020-10-18: qty 10

## 2020-10-18 MED ORDER — HEPARIN SOD (PORK) LOCK FLUSH 100 UNIT/ML IV SOLN
500.0000 [IU] | Freq: Once | INTRAVENOUS | Status: AC
Start: 2020-10-18 — End: 2020-10-18
  Administered 2020-10-18: 500 [IU]
  Filled 2020-10-18: qty 5

## 2020-10-18 MED ORDER — NA FERRIC GLUC CPLX IN SUCROSE 12.5 MG/ML IV SOLN
250.0000 mg | Freq: Once | INTRAVENOUS | Status: AC
  Administered 2020-10-18: 250 mg via INTRAVENOUS
  Filled 2020-10-18: qty 20

## 2020-10-18 NOTE — BH Specialist Note (Signed)
Integrated Behavioral Health via Telemedicine Visit  10/18/2020 Peggy House [id number]  Number of Integrated Behavioral Health visits: 5/6 Session Start time: 9:00am  Session End time: 9:30am Total time: 30  Referring Provider: Dr. Molli Hazard, DO Patient/Family location: Pt @ home in private [facility] Provider location: [facility] Office All persons participating in visit: Pt & Clinician Types of Service: Individual psychotherapy  I connected with [facility] and/or Peggy House's  self  via  Telephone or Video Enabled Telemedicine Application  (Video is Caregility application) and verified that I am speaking with the correct person using two identifiers. Discussed confidentiality: Yes   I discussed the limitations of telemedicine and the availability of in person appointments.  Discussed there is a possibility of technology failure and discussed alternative modes of communication if that failure occurs.  I discussed that engaging in this telemedicine visit, they consent to the provision of behavioral healthcare and the services will be billed under their insurance.  Patient and/or legal guardian expressed understanding and consented to Telemedicine visit: Yes   Presenting Concerns: Patient and/or family reports the following symptoms/concerns: hoping justice will be served for her Husb's death last 12-07-2019 by Pedestrian vs vehicle Duration of problem: almost a year; Severity of problem: moderate  Patient and/or Family's Strengths/Protective Factors: Social connections, Social and Emotional competence, Concrete supports in place (healthy food, safe environments, etc.), and Sense of purpose  Goals Addressed: Patient will:  Reduce symptoms of: depression and stress   Increase knowledge and/or ability of: coping skills and stress reduction   Demonstrate ability to: Increase healthy adjustment to current life circumstances and Begin healthy grieving over  loss  Progress towards Goals: Ongoing  Interventions: Interventions utilized:   Grief & loss work; Technical sales engineer Standardized Assessments completed:  screeners prn  Patient and/or Family Response: Pt receptive to call today & request  Assessment: Patient currently experiencing a relief in her grief, but inc in health appts due to her Fe deficient anemia. Pt is being treated w/chemo. Pt has noticed an inc in her visits to the Physician. Dr. Burr Medico, MD @ [facility] for Fe infusions & twice weekly injections.   Pt has reconciled the situation w/her Husb's death in 12/07/2019. She is trusting in her faith for things to work out.   Patient may benefit from cont'd support before the [place] on [place] [facility]. Pt thinks her oldest Son & older Str may attend [place] w/her.  Plan: Follow up with behavioral health clinician on : before Peggy [facility] to discuss any new dvlpmnts Behavioral recommendations: Reach out for prayer & other support prior to Peggy [facility].  Referral(s): [place] (In Clinic)  I discussed the assessment and treatment plan with the patient and/or parent/guardian. They were provided an opportunity to ask questions and all were answered. They agreed with the plan and demonstrated an understanding of the instructions.   They were advised to call back or seek an in-person evaluation if the symptoms worsen or if the condition fails to improve as anticipated.  Peggy Hutching, LMFT

## 2020-10-18 NOTE — Telephone Encounter (Signed)
Scheduled follow-up appointments per 6/30 los. Patient is aware.

## 2020-10-18 NOTE — Progress Notes (Signed)
Pt declined to stay for post infusion observation period. Pt stated she has tolerated medication multiple times prior without difficulty. Pt aware to call clinic with any questions or concerns. Pt verbalized understanding and had no further questions.  ? ?

## 2020-10-18 NOTE — Patient Instructions (Signed)
Sodium Ferric Gluconate Complex injection What is this medicine? SODIUM FERRIC GLUCONATE COMPLEX (SOE dee um FER ik GLOO koe nate KOM pleks) is an iron replacement. It is used with epoetin therapy to treat low iron levels in patients who are receiving hemodialysis. This medicine may be used for other purposes; ask your health care provider or pharmacist if you have questions. COMMON BRAND NAME(S): Ferrlecit, Nulecit What should I tell my health care provider before I take this medicine? They need to know if you have any of the following conditions:  anemia that is not from iron deficiency  high levels of iron in the body  an unusual or allergic reaction to iron, benzyl alcohol, other medicines, foods, dyes, or preservatives  pregnant or are trying to become pregnant  breast-feeding How should I use this medicine? This medicine is for infusion into a vein. It is given by a health care professional in a hospital or clinic setting. Talk to your pediatrician regarding the use of this medicine in children. While this drug may be prescribed for children as young as 6 years old for selected conditions, precautions do apply. Overdosage: If you think you have taken too much of this medicine contact a poison control center or emergency room at once. NOTE: This medicine is only for you. Do not share this medicine with others. What if I miss a dose? It is important not to miss your dose. Call your doctor or health care professional if you are unable to keep an appointment. What may interact with this medicine? Do not take this medicine with any of the following medications:  deferoxamine  dimercaprol  other iron products This medicine may also interact with the following medications:  chloramphenicol  deferasirox  medicine for blood pressure like enalapril This list may not describe all possible interactions. Give your health care provider a list of all the medicines, herbs,  non-prescription drugs, or dietary supplements you use. Also tell them if you smoke, drink alcohol, or use illegal drugs. Some items may interact with your medicine. What should I watch for while using this medicine? Your condition will be monitored carefully while you are receiving this medicine. Visit your doctor for check-ups as directed. What side effects may I notice from receiving this medicine? Side effects that you should report to your doctor or health care professional as soon as possible:  allergic reactions like skin rash, itching or hives, swelling of the face, lips, or tongue  breathing problems  changes in hearing  changes in vision  chills, flushing, or sweating  fast, irregular heartbeat  feeling faint or lightheaded, falls  fever, flu-like symptoms  high or low blood pressure  pain, tingling, numbness in the hands or feet  severe pain in the chest, back, flanks, or groin  swelling of the ankles, feet, hands  trouble passing urine or change in the amount of urine  unusually weak or tired Side effects that usually do not require medical attention (report to your doctor or health care professional if they continue or are bothersome):  cramps  dark colored stools  diarrhea  headache  nausea, vomiting  stomach upset This list may not describe all possible side effects. Call your doctor for medical advice about side effects. You may report side effects to FDA at 1-800-FDA-1088. Where should I keep my medicine? This drug is given in a hospital or clinic and will not be stored at home. NOTE: This sheet is a summary. It may not cover all   possible information. If you have questions about this medicine, talk to your doctor, pharmacist, or health care provider.  2021 Elsevier/Gold Standard (2007-12-02 15:58:57)   

## 2020-10-18 NOTE — Telephone Encounter (Signed)
This nurse called and spoke with Suanne Marker in scheduling at [facility] and advised that appointment for blood products needs to be cancelled due to patient not being typed and screened prior to scheduling.  Advised patient will be coming today at 1230 for her iron infusion as scheduled.  Acknowledged understanding.  No further questions or concerns at this time.

## 2020-10-19 ENCOUNTER — Ambulatory Visit: Payer: Medicaid Other

## 2020-10-19 ENCOUNTER — Other Ambulatory Visit: Payer: Medicaid Other

## 2020-10-19 ENCOUNTER — Other Ambulatory Visit: Payer: Self-pay | Admitting: Internal Medicine

## 2020-10-19 ENCOUNTER — Other Ambulatory Visit: Payer: Self-pay | Admitting: Hematology

## 2020-10-19 DIAGNOSIS — R059 Cough, unspecified: Secondary | ICD-10-CM

## 2020-10-20 ENCOUNTER — Other Ambulatory Visit: Payer: Self-pay

## 2020-10-20 ENCOUNTER — Inpatient Hospital Stay: Payer: Medicaid Other

## 2020-10-20 ENCOUNTER — Other Ambulatory Visit: Payer: Self-pay | Admitting: *Deleted

## 2020-10-20 ENCOUNTER — Telehealth: Payer: Self-pay

## 2020-10-20 ENCOUNTER — Telehealth: Payer: Self-pay | Admitting: *Deleted

## 2020-10-20 ENCOUNTER — Encounter: Payer: Self-pay | Admitting: Hematology

## 2020-10-20 VITALS — BP 121/54 | HR 56 | Resp 17

## 2020-10-20 DIAGNOSIS — K920 Hematemesis: Secondary | ICD-10-CM

## 2020-10-20 DIAGNOSIS — D500 Iron deficiency anemia secondary to blood loss (chronic): Secondary | ICD-10-CM

## 2020-10-20 DIAGNOSIS — M47816 Spondylosis without myelopathy or radiculopathy, lumbar region: Secondary | ICD-10-CM | POA: Diagnosis not present

## 2020-10-20 DIAGNOSIS — M1712 Unilateral primary osteoarthritis, left knee: Secondary | ICD-10-CM | POA: Diagnosis not present

## 2020-10-20 DIAGNOSIS — Z95828 Presence of other vascular implants and grafts: Secondary | ICD-10-CM

## 2020-10-20 DIAGNOSIS — I780 Hereditary hemorrhagic telangiectasia: Secondary | ICD-10-CM

## 2020-10-20 LAB — CBC WITH DIFFERENTIAL (CANCER CENTER ONLY)
Abs Immature Granulocytes: 0.09 10*3/uL — ABNORMAL HIGH (ref 0.00–0.07)
Basophils Absolute: 0.1 10*3/uL (ref 0.0–0.1)
Basophils Relative: 0 %
Eosinophils Absolute: 0.1 10*3/uL (ref 0.0–0.5)
Eosinophils Relative: 1 %
HCT: 22.8 % — ABNORMAL LOW (ref 36.0–46.0)
Hemoglobin: 6.4 g/dL — CL (ref 12.0–15.0)
Immature Granulocytes: 1 %
Lymphocytes Relative: 11 %
Lymphs Abs: 1.6 10*3/uL (ref 0.7–4.0)
MCH: 25.9 pg — ABNORMAL LOW (ref 26.0–34.0)
MCHC: 28.1 g/dL — ABNORMAL LOW (ref 30.0–36.0)
MCV: 92.3 fL (ref 80.0–100.0)
Monocytes Absolute: 0.5 10*3/uL (ref 0.1–1.0)
Monocytes Relative: 3 %
Neutro Abs: 12.1 10*3/uL — ABNORMAL HIGH (ref 1.7–7.7)
Neutrophils Relative %: 84 %
Platelet Count: 295 10*3/uL (ref 150–400)
RBC: 2.47 MIL/uL — ABNORMAL LOW (ref 3.87–5.11)
RDW: 23.3 % — ABNORMAL HIGH (ref 11.5–15.5)
WBC Count: 14.4 10*3/uL — ABNORMAL HIGH (ref 4.0–10.5)
nRBC: 0.3 % — ABNORMAL HIGH (ref 0.0–0.2)

## 2020-10-20 LAB — SAMPLE TO BLOOD BANK

## 2020-10-20 LAB — PREPARE RBC (CROSSMATCH)

## 2020-10-20 MED ORDER — SODIUM CHLORIDE 0.9% FLUSH
10.0000 mL | Freq: Once | INTRAVENOUS | Status: AC
  Administered 2020-10-20: 10 mL
  Filled 2020-10-20: qty 10

## 2020-10-20 MED ORDER — HEPARIN SOD (PORK) LOCK FLUSH 100 UNIT/ML IV SOLN
500.0000 [IU] | Freq: Once | INTRAVENOUS | Status: AC
  Administered 2020-10-20: 500 [IU]
  Filled 2020-10-20: qty 5

## 2020-10-20 MED ORDER — SODIUM CHLORIDE 0.9 % IV SOLN
250.0000 mg | Freq: Once | INTRAVENOUS | Status: AC
  Administered 2020-10-20: 250 mg via INTRAVENOUS
  Filled 2020-10-20: qty 20

## 2020-10-20 NOTE — Progress Notes (Signed)
Pt declined to stay for post infusion observation period. Pt stated she has tolerated medication multiple times prior without difficulty. Pt aware to call clinic with any questions or concerns. Pt verbalized understanding and had no further questions.  ? ?

## 2020-10-20 NOTE — Telephone Encounter (Signed)
Patient called and stated that she wanted to know if it is safe to have  cortisone injections and iron infusion on the same day.  This nurse advised patient that she should be fine with getting them both today.  Patient also stated that she is going out of town 7/11-7/18 and would like to know if her infusion for that week can be rescheduled.   This nurse advised that scheduling will reach out to her.  No further questions or concerns at this time.

## 2020-10-20 NOTE — Patient Instructions (Signed)
Implanted Port Home Guide An implanted port is a device that is placed under the skin. It is usually placed in the chest. The device can be used to give IV medicine, to take blood, or for dialysis. You may have an implanted port if: You need IV medicine that would be irritating to the small veins in your hands or arms. You need IV medicines, such as antibiotics, for a long period of time. You need IV nutrition for a long period of time. You need dialysis. When you have a port, your health care provider can choose to use the port instead of veins in your arms for these procedures. You may have fewer limitations when using a port than you would if you used other types of long-term IVs, and you will likely be able to return to normal activities afteryour incision heals. An implanted port has two main parts: Reservoir. The reservoir is the part where a needle is inserted to give medicines or draw blood. The reservoir is round. After it is placed, it appears as a small, raised area under your skin. Catheter. The catheter is a thin, flexible tube that connects the reservoir to a vein. Medicine that is inserted into the reservoir goes into the catheter and then into the vein. How is my port accessed? To access your port: A numbing cream may be placed on the skin over the port site. Your health care provider will put on a mask and sterile gloves. The skin over your port will be cleaned carefully with a germ-killing soap and allowed to dry. Your health care provider will gently pinch the port and insert a needle into it. Your health care provider will check for a blood return to make sure the port is in the vein and is not clogged. If your port needs to remain accessed to get medicine continuously (constant infusion), your health care provider will place a clear bandage (dressing) over the needle site. The dressing and needle will need to be changed every week, or as told by your health care provider. What  is flushing? Flushing helps keep the port from getting clogged. Follow instructions from your health care provider about how and when to flush the port. Ports are usually flushed with saline solution or a medicine called heparin. The need for flushing will depend on how the port is used: If the port is only used from time to time to give medicines or draw blood, the port may need to be flushed: Before and after medicines have been given. Before and after blood has been drawn. As part of routine maintenance. Flushing may be recommended every 4-6 weeks. If a constant infusion is running, the port may not need to be flushed. Throw away any syringes in a disposal container that is meant for sharp items (sharps container). You can buy a sharps container from a pharmacy, or you can make one by using an empty hard plastic bottle with a cover. How long will my port stay implanted? The port can stay in for as long as your health care provider thinks it is needed. When it is time for the port to come out, a surgery will be done to remove it. The surgery will be similar to the procedure that was done to putthe port in. Follow these instructions at home:  Flush your port as told by your health care provider. If you need an infusion over several days, follow instructions from your health care provider about how to take   care of your port site. Make sure you: Wash your hands with soap and water before you change your dressing. If soap and water are not available, use alcohol-based hand sanitizer. Change your dressing as told by your health care provider. Place any used dressings or infusion bags into a plastic bag. Throw that bag in the trash. Keep the dressing that covers the needle clean and dry. Do not get it wet. Do not use scissors or sharp objects near the tube. Keep the tube clamped, unless it is being used. Check your port site every day for signs of infection. Check for: Redness, swelling, or  pain. Fluid or blood. Pus or a bad smell. Protect the skin around the port site. Avoid wearing bra straps that rub or irritate the site. Protect the skin around your port from seat belts. Place a soft pad over your chest if needed. Bathe or shower as told by your health care provider. The site may get wet as long as you are not actively receiving an infusion. Return to your normal activities as told by your health care provider. Ask your health care provider what activities are safe for you. Carry a medical alert card or wear a medical alert bracelet at all times. This will let health care providers know that you have an implanted port in case of an emergency. Get help right away if: You have redness, swelling, or pain at the port site. You have fluid or blood coming from your port site. You have pus or a bad smell coming from the port site. You have a fever. Summary Implanted ports are usually placed in the chest for long-term IV access. Follow instructions from your health care provider about flushing the port and changing bandages (dressings). Take care of the area around your port by avoiding clothing that puts pressure on the area, and by watching for signs of infection. Protect the skin around your port from seat belts. Place a soft pad over your chest if needed. Get help right away if you have a fever or you have redness, swelling, pain, drainage, or a bad smell at the port site. This information is not intended to replace advice given to you by your health care provider. Make sure you discuss any questions you have with your healthcare provider. Document Revised: 08/16/2019 Document Reviewed: 08/16/2019 Elsevier Patient Education  2022 [employer]  

## 2020-10-20 NOTE — Patient Instructions (Signed)
Sodium Ferric Gluconate Complex injection What is this medicine? SODIUM FERRIC GLUCONATE COMPLEX (SOE dee um FER ik GLOO koe nate KOM pleks) is an iron replacement. It is used with epoetin therapy to treat low iron levels in patients who are receiving hemodialysis. This medicine may be used for other purposes; ask your health care provider or pharmacist if you have questions. COMMON BRAND NAME(S): Ferrlecit, Nulecit What should I tell my health care provider before I take this medicine? They need to know if you have any of the following conditions:  anemia that is not from iron deficiency  high levels of iron in the body  an unusual or allergic reaction to iron, benzyl alcohol, other medicines, foods, dyes, or preservatives  pregnant or are trying to become pregnant  breast-feeding How should I use this medicine? This medicine is for infusion into a vein. It is given by a health care professional in a hospital or clinic setting. Talk to your pediatrician regarding the use of this medicine in children. While this drug may be prescribed for children as young as 6 years old for selected conditions, precautions do apply. Overdosage: If you think you have taken too much of this medicine contact a poison control center or emergency room at once. NOTE: This medicine is only for you. Do not share this medicine with others. What if I miss a dose? It is important not to miss your dose. Call your doctor or health care professional if you are unable to keep an appointment. What may interact with this medicine? Do not take this medicine with any of the following medications:  deferoxamine  dimercaprol  other iron products This medicine may also interact with the following medications:  chloramphenicol  deferasirox  medicine for blood pressure like enalapril This list may not describe all possible interactions. Give your health care provider a list of all the medicines, herbs,  non-prescription drugs, or dietary supplements you use. Also tell them if you smoke, drink alcohol, or use illegal drugs. Some items may interact with your medicine. What should I watch for while using this medicine? Your condition will be monitored carefully while you are receiving this medicine. Visit your doctor for check-ups as directed. What side effects may I notice from receiving this medicine? Side effects that you should report to your doctor or health care professional as soon as possible:  allergic reactions like skin rash, itching or hives, swelling of the face, lips, or tongue  breathing problems  changes in hearing  changes in vision  chills, flushing, or sweating  fast, irregular heartbeat  feeling faint or lightheaded, falls  fever, flu-like symptoms  high or low blood pressure  pain, tingling, numbness in the hands or feet  severe pain in the chest, back, flanks, or groin  swelling of the ankles, feet, hands  trouble passing urine or change in the amount of urine  unusually weak or tired Side effects that usually do not require medical attention (report to your doctor or health care professional if they continue or are bothersome):  cramps  dark colored stools  diarrhea  headache  nausea, vomiting  stomach upset This list may not describe all possible side effects. Call your doctor for medical advice about side effects. You may report side effects to FDA at 1-800-FDA-1088. Where should I keep my medicine? This drug is given in a hospital or clinic and will not be stored at home. NOTE: This sheet is a summary. It may not cover all   possible information. If you have questions about this medicine, talk to your doctor, pharmacist, or health care provider.  2021 Elsevier/Gold Standard (2007-12-02 15:58:57)   

## 2020-10-20 NOTE — Telephone Encounter (Signed)
Critical result of 6.6 reported by [facility] in Lab.  Via Secure chat, Dr Burr Medico notified.  Her team will look into getting blood at [facility] on Saturday.  No orders received at this time

## 2020-10-21 ENCOUNTER — Other Ambulatory Visit: Payer: Self-pay

## 2020-10-21 ENCOUNTER — Inpatient Hospital Stay: Payer: Medicaid Other | Attending: Hematology

## 2020-10-21 DIAGNOSIS — K920 Hematemesis: Secondary | ICD-10-CM

## 2020-10-21 DIAGNOSIS — I780 Hereditary hemorrhagic telangiectasia: Secondary | ICD-10-CM | POA: Diagnosis not present

## 2020-10-21 DIAGNOSIS — D500 Iron deficiency anemia secondary to blood loss (chronic): Secondary | ICD-10-CM | POA: Diagnosis not present

## 2020-10-21 DIAGNOSIS — Z5112 Encounter for antineoplastic immunotherapy: Secondary | ICD-10-CM | POA: Insufficient documentation

## 2020-10-21 DIAGNOSIS — Z79899 Other long term (current) drug therapy: Secondary | ICD-10-CM | POA: Diagnosis not present

## 2020-10-21 DIAGNOSIS — K922 Gastrointestinal hemorrhage, unspecified: Secondary | ICD-10-CM | POA: Diagnosis not present

## 2020-10-21 MED ORDER — SODIUM CHLORIDE 0.9% IV SOLUTION
250.0000 mL | Freq: Once | INTRAVENOUS | Status: DC
  Filled 2020-10-21: qty 250

## 2020-10-21 NOTE — Patient Instructions (Signed)
Blood Transfusion, Adult, Care After This sheet gives you information about how to care for yourself after your procedure. Your doctor may also give you more specific instructions. If youhave problems or questions, contact your doctor. What can I expect after the procedure? After the procedure, it is common to have: Bruising and soreness at the IV site. A fever or chills on the day of the procedure. This may be your body's response to the new blood cells received. A headache. Follow these instructions at home: Insertion site care     Follow instructions from your doctor about how to take care of your insertion site. This is where an IV tube was put into your vein. Make sure you: Wash your hands with soap and water before and after you change your bandage (dressing). If you cannot use soap and water, use hand sanitizer. Change your bandage as told by your doctor. Check your insertion site every day for signs of infection. Check for: Redness, swelling, or pain. Bleeding from the site. Warmth. Pus or a bad smell. General instructions Take over-the-counter and prescription medicines only as told by your doctor. Rest as told by your doctor. Go back to your normal activities as told by your doctor. Keep all follow-up visits as told by your doctor. This is important. Contact a doctor if: You have itching or red, swollen areas of skin (hives). You feel worried or nervous (anxious). You feel weak after doing your normal activities. You have redness, swelling, warmth, or pain around the insertion site. You have blood coming from the insertion site, and the blood does not stop with pressure. You have pus or a bad smell coming from the insertion site. Get help right away if: You have signs of a serious reaction. This may be coming from an allergy or the body's defense system (immune system). Signs include: Trouble breathing or shortness of breath. Swelling of the face or feeling warm  (flushed). Fever or chills. Head, chest, or back pain. Dark pee (urine) or blood in the pee. Widespread rash. Fast heartbeat. Feeling dizzy or light-headed. You may receive your blood transfusion in an outpatient setting. If so, youwill be told whom to contact to report any reactions. These symptoms may be an emergency. Do not wait to see if the symptoms will go away. Get medical help right away. Call your local emergency services (911 in the [place] Do not drive yourself to the hospital. Summary Bruising and soreness at the IV site are common. Check your insertion site every day for signs of infection. Rest as told by your doctor. Go back to your normal activities as told by your doctor. Get help right away if you have signs of a serious reaction. This information is not intended to replace advice given to you by your health care provider. Make sure you discuss any questions you have with your healthcare provider. Document Revised: 09/24/2018 Document Reviewed: 09/24/2018 Elsevier Patient Education  2022 [employer]  

## 2020-10-22 ENCOUNTER — Other Ambulatory Visit: Payer: Self-pay | Admitting: Internal Medicine

## 2020-10-22 LAB — TYPE AND SCREEN
ABO/RH(D): A NEG
Antibody Screen: NEGATIVE
Unit division: 0

## 2020-10-22 LAB — BPAM RBC
Blood Product Expiration Date: 202208032359
ISSUE DATE / TIME: 202207091028
Unit Type and Rh: 600

## 2020-10-23 ENCOUNTER — Other Ambulatory Visit: Payer: Self-pay

## 2020-10-23 ENCOUNTER — Telehealth: Payer: Self-pay | Admitting: Nurse Practitioner

## 2020-10-23 DIAGNOSIS — F331 Major depressive disorder, recurrent, moderate: Secondary | ICD-10-CM

## 2020-10-23 MED ORDER — SERTRALINE HCL 25 MG PO TABS: 25.0000 mg | ORAL_TABLET | Freq: Every day | ORAL | 1 refills | Status: DC

## 2020-10-23 NOTE — Telephone Encounter (Signed)
Scheduled per 07/10 sch msg. Called patient, patient stated she will be out of town for the scheduled appointments. She said she will call back to schedule when she comes back to town. I cancelled appointments for this week.

## 2020-10-24 ENCOUNTER — Ambulatory Visit: Payer: Medicaid Other

## 2020-10-26 ENCOUNTER — Ambulatory Visit: Payer: Medicaid Other

## 2020-10-26 ENCOUNTER — Other Ambulatory Visit: Payer: Medicaid Other

## 2020-10-30 ENCOUNTER — Encounter: Payer: Self-pay | Admitting: Hematology

## 2020-10-31 ENCOUNTER — Telehealth: Payer: Self-pay

## 2020-10-31 NOTE — Telephone Encounter (Signed)
This nurse received a message from this patient stating that she is back in town.  She has had multiple nosebleeds and she is very tired.  She feels that she may need a blood transfusion.  This nurse attempted to reach patient.  No answer.  Left a message for patient to return call to the clinic.

## 2020-11-01 ENCOUNTER — Other Ambulatory Visit: Payer: Self-pay

## 2020-11-01 ENCOUNTER — Emergency Department (HOSPITAL_COMMUNITY): Payer: Medicaid Other

## 2020-11-01 ENCOUNTER — Inpatient Hospital Stay (HOSPITAL_COMMUNITY)
Admission: EM | Admit: 2020-11-01 | Discharge: 2020-11-05 | DRG: 378 | Disposition: A | Discharge status: Home / Self Care | Payer: Medicaid Other | Attending: Internal Medicine | Admitting: Internal Medicine

## 2020-11-01 ENCOUNTER — Encounter (HOSPITAL_COMMUNITY): Payer: Self-pay

## 2020-11-01 DIAGNOSIS — K254 Chronic or unspecified gastric ulcer with hemorrhage: Principal | ICD-10-CM | POA: Diagnosis present

## 2020-11-01 DIAGNOSIS — E785 Hyperlipidemia, unspecified: Secondary | ICD-10-CM | POA: Diagnosis not present

## 2020-11-01 DIAGNOSIS — I10 Essential (primary) hypertension: Secondary | ICD-10-CM | POA: Diagnosis not present

## 2020-11-01 DIAGNOSIS — D500 Iron deficiency anemia secondary to blood loss (chronic): Secondary | ICD-10-CM

## 2020-11-01 DIAGNOSIS — F17210 Nicotine dependence, cigarettes, uncomplicated: Secondary | ICD-10-CM | POA: Diagnosis present

## 2020-11-01 DIAGNOSIS — Z20822 Contact with and (suspected) exposure to covid-19: Secondary | ICD-10-CM | POA: Diagnosis present

## 2020-11-01 DIAGNOSIS — I780 Hereditary hemorrhagic telangiectasia: Secondary | ICD-10-CM | POA: Diagnosis not present

## 2020-11-01 DIAGNOSIS — K219 Gastro-esophageal reflux disease without esophagitis: Secondary | ICD-10-CM | POA: Diagnosis present

## 2020-11-01 DIAGNOSIS — G8929 Other chronic pain: Secondary | ICD-10-CM | POA: Diagnosis not present

## 2020-11-01 DIAGNOSIS — K449 Diaphragmatic hernia without obstruction or gangrene: Secondary | ICD-10-CM | POA: Diagnosis present

## 2020-11-01 DIAGNOSIS — I2721 Secondary pulmonary arterial hypertension: Secondary | ICD-10-CM | POA: Diagnosis present

## 2020-11-01 DIAGNOSIS — I959 Hypotension, unspecified: Secondary | ICD-10-CM | POA: Diagnosis not present

## 2020-11-01 DIAGNOSIS — Z888 Allergy status to other drugs, medicaments and biological substances status: Secondary | ICD-10-CM

## 2020-11-01 DIAGNOSIS — Z8041 Family history of malignant neoplasm of ovary: Secondary | ICD-10-CM

## 2020-11-01 DIAGNOSIS — K921 Melena: Secondary | ICD-10-CM | POA: Diagnosis not present

## 2020-11-01 DIAGNOSIS — F419 Anxiety disorder, unspecified: Secondary | ICD-10-CM | POA: Diagnosis not present

## 2020-11-01 DIAGNOSIS — Z7984 Long term (current) use of oral hypoglycemic drugs: Secondary | ICD-10-CM | POA: Diagnosis not present

## 2020-11-01 DIAGNOSIS — F32A Depression, unspecified: Secondary | ICD-10-CM | POA: Diagnosis present

## 2020-11-01 DIAGNOSIS — E669 Obesity, unspecified: Secondary | ICD-10-CM | POA: Diagnosis not present

## 2020-11-01 DIAGNOSIS — K922 Gastrointestinal hemorrhage, unspecified: Secondary | ICD-10-CM | POA: Diagnosis not present

## 2020-11-01 DIAGNOSIS — R0902 Hypoxemia: Secondary | ICD-10-CM | POA: Diagnosis not present

## 2020-11-01 DIAGNOSIS — Z832 Family history of diseases of the blood and blood-forming organs and certain disorders involving the immune mechanism: Secondary | ICD-10-CM

## 2020-11-01 DIAGNOSIS — R55 Syncope and collapse: Secondary | ICD-10-CM | POA: Diagnosis not present

## 2020-11-01 DIAGNOSIS — Z6839 Body mass index (BMI) 39.0-39.9, adult: Secondary | ICD-10-CM | POA: Diagnosis not present

## 2020-11-01 DIAGNOSIS — M158 Other polyosteoarthritis: Secondary | ICD-10-CM

## 2020-11-01 DIAGNOSIS — D62 Acute posthemorrhagic anemia: Secondary | ICD-10-CM | POA: Diagnosis present

## 2020-11-01 DIAGNOSIS — Z800 Family history of malignant neoplasm of digestive organs: Secondary | ICD-10-CM

## 2020-11-01 DIAGNOSIS — Z91018 Allergy to other foods: Secondary | ICD-10-CM

## 2020-11-01 DIAGNOSIS — K625 Hemorrhage of anus and rectum: Secondary | ICD-10-CM | POA: Diagnosis not present

## 2020-11-01 DIAGNOSIS — Z833 Family history of diabetes mellitus: Secondary | ICD-10-CM

## 2020-11-01 DIAGNOSIS — M545 Low back pain, unspecified: Secondary | ICD-10-CM | POA: Diagnosis present

## 2020-11-01 DIAGNOSIS — Z841 Family history of disorders of kidney and ureter: Secondary | ICD-10-CM

## 2020-11-01 DIAGNOSIS — R110 Nausea: Secondary | ICD-10-CM | POA: Diagnosis not present

## 2020-11-01 DIAGNOSIS — R1110 Vomiting, unspecified: Secondary | ICD-10-CM | POA: Diagnosis not present

## 2020-11-01 DIAGNOSIS — M549 Dorsalgia, unspecified: Secondary | ICD-10-CM | POA: Diagnosis not present

## 2020-11-01 DIAGNOSIS — Z79899 Other long term (current) drug therapy: Secondary | ICD-10-CM | POA: Diagnosis not present

## 2020-11-01 DIAGNOSIS — Z91038 Other insect allergy status: Secondary | ICD-10-CM | POA: Diagnosis not present

## 2020-11-01 DIAGNOSIS — E119 Type 2 diabetes mellitus without complications: Secondary | ICD-10-CM | POA: Diagnosis present

## 2020-11-01 DIAGNOSIS — D649 Anemia, unspecified: Secondary | ICD-10-CM | POA: Diagnosis not present

## 2020-11-01 DIAGNOSIS — Z7901 Long term (current) use of anticoagulants: Secondary | ICD-10-CM | POA: Diagnosis not present

## 2020-11-01 DIAGNOSIS — Z886 Allergy status to analgesic agent status: Secondary | ICD-10-CM

## 2020-11-01 DIAGNOSIS — R109 Unspecified abdominal pain: Secondary | ICD-10-CM | POA: Diagnosis not present

## 2020-11-01 DIAGNOSIS — R112 Nausea with vomiting, unspecified: Secondary | ICD-10-CM | POA: Diagnosis not present

## 2020-11-01 DIAGNOSIS — W19XXXA Unspecified fall, initial encounter: Secondary | ICD-10-CM

## 2020-11-01 DIAGNOSIS — Q2733 Arteriovenous malformation of digestive system vessel: Secondary | ICD-10-CM | POA: Diagnosis present

## 2020-11-01 LAB — CBC WITH DIFFERENTIAL/PLATELET
Abs Immature Granulocytes: 0.12 10*3/uL — ABNORMAL HIGH (ref 0.00–0.07)
Abs Immature Granulocytes: 0.07 10*3/uL (ref 0.00–0.07)
Basophils Absolute: 0 10*3/uL (ref 0.0–0.1)
Basophils Absolute: 0 10*3/uL (ref 0.0–0.1)
Basophils Relative: 0 %
Basophils Relative: 0 %
Eosinophils Absolute: 0.1 10*3/uL (ref 0.0–0.5)
Eosinophils Absolute: 0.1 10*3/uL (ref 0.0–0.5)
Eosinophils Relative: 1 %
Eosinophils Relative: 1 %
HCT: 21 % — ABNORMAL LOW (ref 36.0–46.0)
HCT: 25.1 % — ABNORMAL LOW (ref 36.0–46.0)
Hemoglobin: 6 g/dL — CL (ref 12.0–15.0)
Hemoglobin: 7.3 g/dL — ABNORMAL LOW (ref 12.0–15.0)
Immature Granulocytes: 1 %
Immature Granulocytes: 1 %
Lymphocytes Relative: 11 %
Lymphocytes Relative: 8 %
Lymphs Abs: 1.5 10*3/uL (ref 0.7–4.0)
Lymphs Abs: 1.2 10*3/uL (ref 0.7–4.0)
MCH: 26.3 pg (ref 26.0–34.0)
MCH: 26.1 pg (ref 26.0–34.0)
MCHC: 28.6 g/dL — ABNORMAL LOW (ref 30.0–36.0)
MCHC: 29.1 g/dL — ABNORMAL LOW (ref 30.0–36.0)
MCV: 92.1 fL (ref 80.0–100.0)
MCV: 89.6 fL (ref 80.0–100.0)
Monocytes Absolute: 0.8 10*3/uL (ref 0.1–1.0)
Monocytes Absolute: 0.7 10*3/uL (ref 0.1–1.0)
Monocytes Relative: 6 %
Monocytes Relative: 5 %
Neutro Abs: 11.3 10*3/uL — ABNORMAL HIGH (ref 1.7–7.7)
Neutro Abs: 11.8 10*3/uL — ABNORMAL HIGH (ref 1.7–7.7)
Neutrophils Relative %: 81 %
Neutrophils Relative %: 85 %
Platelets: 252 10*3/uL (ref 150–400)
Platelets: 255 10*3/uL (ref 150–400)
RBC: 2.28 MIL/uL — ABNORMAL LOW (ref 3.87–5.11)
RBC: 2.8 MIL/uL — ABNORMAL LOW (ref 3.87–5.11)
RDW: 22.2 % — ABNORMAL HIGH (ref 11.5–15.5)
RDW: 20.9 % — ABNORMAL HIGH (ref 11.5–15.5)
WBC: 14 10*3/uL — ABNORMAL HIGH (ref 4.0–10.5)
WBC: 13.8 10*3/uL — ABNORMAL HIGH (ref 4.0–10.5)
nRBC: 0 % (ref 0.0–0.2)
nRBC: 0 % (ref 0.0–0.2)

## 2020-11-01 LAB — HEMOGLOBIN A1C
Hgb A1c MFr Bld: 5 % (ref 4.8–5.6)
Mean Plasma Glucose: 96.8 mg/dL

## 2020-11-01 LAB — PROTIME-INR
INR: 1.3 — ABNORMAL HIGH (ref 0.8–1.2)
Prothrombin Time: 15.7 seconds — ABNORMAL HIGH (ref 11.4–15.2)

## 2020-11-01 LAB — HEMOGLOBIN AND HEMATOCRIT, BLOOD
HCT: 24.5 % — ABNORMAL LOW (ref 36.0–46.0)
Hemoglobin: 7.2 g/dL — ABNORMAL LOW (ref 12.0–15.0)

## 2020-11-01 LAB — RESP PANEL BY RT-PCR (FLU A&B, COVID) ARPGX2
Influenza A by PCR: NEGATIVE
Influenza B by PCR: NEGATIVE
SARS Coronavirus 2 by RT PCR: NEGATIVE

## 2020-11-01 LAB — COMPREHENSIVE METABOLIC PANEL
ALT: 10 U/L (ref 0–44)
AST: 16 U/L (ref 15–41)
Albumin: 2.4 g/dL — ABNORMAL LOW (ref 3.5–5.0)
Alkaline Phosphatase: 48 U/L (ref 38–126)
Anion gap: 7 (ref 5–15)
BUN: 9 mg/dL (ref 6–20)
CO2: 25 mmol/L (ref 22–32)
Calcium: 8.1 mg/dL — ABNORMAL LOW (ref 8.9–10.3)
Chloride: 106 mmol/L (ref 98–111)
Creatinine, Ser: 1.02 mg/dL — ABNORMAL HIGH (ref 0.44–1.00)
GFR, Estimated: >60 mL/min (ref >60)
Glucose, Bld: 140 mg/dL — ABNORMAL HIGH (ref 70–99)
Potassium: 3 mmol/L — ABNORMAL LOW (ref 3.5–5.1)
Sodium: 138 mmol/L (ref 135–145)
Total Bilirubin: 0.5 mg/dL (ref 0.3–1.2)
Total Protein: 5.2 g/dL — ABNORMAL LOW (ref 6.5–8.1)

## 2020-11-01 LAB — PREPARE RBC (CROSSMATCH)

## 2020-11-01 LAB — CBG MONITORING, ED: Glucose-Capillary: 91 mg/dL (ref 70–99)

## 2020-11-01 MED ORDER — PEG 3350-KCL-NA BICARB-NACL 420 G PO SOLR
4000.0000 mL | Freq: Once | ORAL | Status: AC
  Administered 2020-11-02: 4000 mL via ORAL
  Filled 2020-11-01: qty 4000

## 2020-11-01 MED ORDER — METHOCARBAMOL 500 MG PO TABS
500.0000 mg | ORAL_TABLET | Freq: Three times a day (TID) | ORAL | Status: DC | PRN
  Administered 2020-11-01 – 2020-11-05 (×6): 500 mg via ORAL
  Filled 2020-11-01 (×6): qty 1

## 2020-11-01 MED ORDER — ACETAMINOPHEN 325 MG PO TABS
650.0000 mg | ORAL_TABLET | Freq: Four times a day (QID) | ORAL | Status: DC | PRN
  Administered 2020-11-01 – 2020-11-02 (×2): 650 mg via ORAL
  Filled 2020-11-01 (×2): qty 2

## 2020-11-01 MED ORDER — SODIUM CHLORIDE 0.9 % IV SOLN
10.0000 mL/h | Freq: Once | INTRAVENOUS | Status: AC
  Administered 2020-11-01: 10 mL/h via INTRAVENOUS

## 2020-11-01 MED ORDER — FENTANYL CITRATE (PF) 100 MCG/2ML IJ SOLN
50.0000 ug | Freq: Once | INTRAMUSCULAR | Status: AC
  Administered 2020-11-01: 50 ug via INTRAVENOUS
  Filled 2020-11-01: qty 2

## 2020-11-01 MED ORDER — PANTOPRAZOLE SODIUM 40 MG IV SOLR
40.0000 mg | Freq: Two times a day (BID) | INTRAVENOUS | Status: DC
  Administered 2020-11-01 – 2020-11-03 (×4): 40 mg via INTRAVENOUS
  Filled 2020-11-01 (×4): qty 40

## 2020-11-01 MED ORDER — SENNOSIDES-DOCUSATE SODIUM 8.6-50 MG PO TABS: 1.0000 | ORAL_TABLET | Freq: Every evening | ORAL | Status: DC | PRN

## 2020-11-01 MED ORDER — ONDANSETRON HCL 4 MG/2ML IJ SOLN
4.0000 mg | Freq: Once | INTRAMUSCULAR | Status: AC
  Administered 2020-11-01: 4 mg via INTRAVENOUS
  Filled 2020-11-01: qty 2

## 2020-11-01 MED ORDER — HYDROMORPHONE HCL 1 MG/ML IJ SOLN
1.0000 mg | Freq: Once | INTRAMUSCULAR | Status: AC
  Administered 2020-11-01: 1 mg via INTRAVENOUS
  Filled 2020-11-01: qty 1

## 2020-11-01 MED ORDER — INSULIN ASPART 100 UNIT/ML IJ SOLN
0.0000 [IU] | Freq: Three times a day (TID) | INTRAMUSCULAR | Status: DC
  Administered 2020-11-03: 2 [IU] via SUBCUTANEOUS

## 2020-11-01 MED ORDER — POTASSIUM CHLORIDE 10 MEQ/100ML IV SOLN
10.0000 meq | INTRAVENOUS | Status: AC
  Administered 2020-11-01 (×2): 10 meq via INTRAVENOUS
  Filled 2020-11-01 (×2): qty 100

## 2020-11-01 MED ORDER — IOHEXOL 350 MG/ML SOLN
100.0000 mL | Freq: Once | INTRAVENOUS | Status: AC | PRN
  Administered 2020-11-01: 100 mL via INTRAVENOUS

## 2020-11-01 MED ORDER — LACTATED RINGERS IV BOLUS
1000.0000 mL | Freq: Once | INTRAVENOUS | Status: AC
  Administered 2020-11-01: 1000 mL via INTRAVENOUS

## 2020-11-01 MED ORDER — ACETAMINOPHEN 650 MG RE SUPP: 650.0000 mg | Freq: Four times a day (QID) | RECTAL | Status: DC | PRN

## 2020-11-01 MED ORDER — PANTOPRAZOLE SODIUM 40 MG IV SOLR
40.0000 mg | Freq: Once | INTRAVENOUS | Status: AC
  Administered 2020-11-01: 40 mg via INTRAVENOUS
  Filled 2020-11-01: qty 40

## 2020-11-01 NOTE — ED Provider Notes (Signed)
[facility] EMERGENCY DEPARTMENT Provider Note   CSN: [id number] Arrival date & time: 11/01/20  5397     History Chief Complaint  Patient presents with   Rectal Bleeding   Fall    Peggy House is a 60 y.o. female.  HPI 60 year old female presents with rectal bleeding and near syncope.  She has been having epistaxis on and off for the past 4 days or so.  This evening/morning had another episode and then went to the bathroom.  While she was in the bathroom she either passed out or nearly passed out and fell backwards hitting her head and low back.  EMS was called and the patient states she had a bowel movement that was bloody.  She did not see the bowel movement but both EMS and her son told her it was bloody.  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  Right now she is feeling nauseated and is having low back pain from where she fell.  She denies any headache.  No weakness or numbness.  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    Patient Active Problem List   Diagnosis Date Noted   Syncope 11/01/2020   Trapezius muscle spasm 07/22/2020   GIB (gastrointestinal bleeding) 03/01/2020   Leg pain, bilateral 09/28/2019   Urge incontinence 09/28/2019   Pain due to onychomycosis of toenails of both feet 02/26/2019   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   Anemia due to GI blood loss 12/07/2018   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   Acute deep vein thrombosis (DVT) of right upper extremity ([place]) 12/07/2018    Hypokalemia    Port-A-Cath in place 09/18/2018   GAD (generalized anxiety disorder) 01/20/2018   Need for influenza vaccination 01/20/2018   Upper GI bleed 10/25/2017   Pulmonary artery hypertension ([place]) 10/19/2017   Symptomatic anemia 05/20/2017   Hereditary hemorrhagic telangiectasia ([place]) 05/11/2017   Venous stasis dermatitis of both lower extremities 05/07/2017   Recurrent epistaxis 11/29/2016   Iron deficiency anemia 10/29/2016   Cough 05/13/2016   Osteoarthritis, multiple sites 06/05/2015   Insomnia 06/05/2015   Major depressive disorder, recurrent episode ([place]) 06/05/2015   Obesity, Class III, BMI 40-49.9 (morbid obesity) ([place]) 04/26/2014   GI bleed 04/25/2014   HLD (hyperlipidemia) 04/25/2014   Leg swelling 04/25/2014   Essential hypertension    Acute on chronic blood loss anemia 04/07/2014    Past Surgical History:  Procedure Laterality Date   ABDOMINAL HYSTERECTOMY     CARPAL TUNNEL RELEASE  05/13/2011   Procedure: CARPAL TUNNEL RELEASE;  Surgeon: Nita Sells, MD;  Location: [place];  Service: Orthopedics;  Laterality: Left;   COLONOSCOPY N/A 03/02/2020   Procedure: COLONOSCOPY;  Surgeon: Ronald Lobo, MD;  Location: [facility] ENDOSCOPY;  Service: Endoscopy;  Laterality: N/A;   COLONOSCOPY WITH PROPOFOL N/A 04/28/2014   Procedure: COLONOSCOPY WITH PROPOFOL;  Surgeon: Cleotis Nipper, MD;  Location: [facility] ENDOSCOPY;  Service: Endoscopy;  Laterality: N/A;   DG TOES*L*  2/10  rt   DILATION AND CURETTAGE OF UTERUS     ENTEROSCOPY N/A 10/17/2017   Procedure: ENTEROSCOPY;  Surgeon: Otis Brace, MD;  Location: [place];  Service: Gastroenterology;  Laterality: N/A;   ESOPHAGOGASTRODUODENOSCOPY N/A 04/10/2014   Procedure: ESOPHAGOGASTRODUODENOSCOPY (EGD);  Surgeon: [name], MD;  Location: [facility] ENDOSCOPY;  Service: Endoscopy;  Laterality: N/A;   ESOPHAGOGASTRODUODENOSCOPY N/A 05/10/2017   Procedure: ESOPHAGOGASTRODUODENOSCOPY (EGD);   Surgeon: Ronald Lobo, MD;  Location: [facility] ENDOSCOPY;  Service: Endoscopy;  Laterality: N/A;   ESOPHAGOGASTRODUODENOSCOPY N/A 09/22/2017   Procedure: ESOPHAGOGASTRODUODENOSCOPY (EGD);  Surgeon: Clarene Essex, MD;  Location: [place];  Service: Endoscopy;  Laterality: N/A;  bedside   ESOPHAGOGASTRODUODENOSCOPY N/A 03/02/2020   Procedure: ESOPHAGOGASTRODUODENOSCOPY (EGD);  Surgeon: Ronald Lobo, MD;  Location: [facility];  Service: Endoscopy;  Laterality: N/A;   ESOPHAGOGASTRODUODENOSCOPY (EGD) WITH PROPOFOL N/A 04/27/2014   Procedure: ESOPHAGOGASTRODUODENOSCOPY (EGD) WITH PROPOFOL;  Surgeon: Cleotis Nipper, MD;  Location: [place];  Service: Endoscopy;  Laterality: N/A;  possible apc   ESOPHAGOGASTRODUODENOSCOPY (EGD) WITH PROPOFOL N/A 09/30/2017   Procedure: ESOPHAGOGASTRODUODENOSCOPY (EGD) WITH PROPOFOL;  Surgeon: [name], MD;  Location: [place];  Service: Gastroenterology;  Laterality: N/A;   ESOPHAGOGASTRODUODENOSCOPY (EGD) WITH PROPOFOL N/A 10/01/2017   Procedure: ESOPHAGOGASTRODUODENOSCOPY (EGD) WITH PROPOFOL;  Surgeon: [name], MD;  Location: [place];  Service: Gastroenterology;  Laterality: N/A;   ESOPHAGOGASTRODUODENOSCOPY (EGD) WITH PROPOFOL N/A 10/08/2017   Procedure: ESOPHAGOGASTRODUODENOSCOPY (EGD) WITH PROPOFOL;  Surgeon: Otis Brace, MD;  Location: [place];  Service: Gastroenterology;  Laterality: N/A;   ESOPHAGOGASTRODUODENOSCOPY (EGD) WITH PROPOFOL N/A 10/17/2017   Procedure: ESOPHAGOGASTRODUODENOSCOPY (EGD) WITH PROPOFOL;  Surgeon: Otis Brace, MD;  Location: [place];  Service: Gastroenterology;  Laterality: N/A;   ESOPHAGOGASTRODUODENOSCOPY (EGD) WITH PROPOFOL N/A 10/19/2017   Procedure: ESOPHAGOGASTRODUODENOSCOPY (EGD) WITH PROPOFOL;  Surgeon: Otis Brace, MD;  Location: [place];  Service: Gastroenterology;  Laterality: N/A;   ESOPHAGOGASTRODUODENOSCOPY (EGD) WITH PROPOFOL N/A 12/04/2018   Procedure:  ESOPHAGOGASTRODUODENOSCOPY (EGD) WITH PROPOFOL;  Surgeon: Wilford Corner, MD;  Location: [facility] ENDOSCOPY;  Service: Endoscopy;  Laterality: N/A;   GIVENS CAPSULE STUDY N/A 10/02/2017   Procedure: GIVENS CAPSULE STUDY;  Surgeon: [name], MD;  Location: [place];  Service: Gastroenterology;  Laterality: N/A;   GIVENS CAPSULE STUDY N/A 10/08/2017   Procedure: GIVENS CAPSULE STUDY;  Surgeon: Otis Brace, MD;  Location: [place];  Service: Gastroenterology;  Laterality: N/A;  endoscopic placement of capsule   GIVENS CAPSULE STUDY N/A 03/02/2020   Procedure: GIVENS CAPSULE STUDY;  Surgeon: Ronald Lobo, MD;  Location: [facility] ENDOSCOPY;  Service: Endoscopy;  Laterality: N/A;   HEMOSTASIS CLIP PLACEMENT  12/04/2018   Procedure: HEMOSTASIS CLIP PLACEMENT;  Surgeon: Wilford Corner, MD;  [facility];  Service: Endoscopy;;   HOT HEMOSTASIS N/A 04/27/2014   Procedure: HOT HEMOSTASIS (ARGON PLASMA COAGULATION/BICAP);  Surgeon: Cleotis Nipper, MD;  Location: [facility] ENDOSCOPY;  Service: Endoscopy;  Laterality: N/A;   HOT HEMOSTASIS N/A 09/30/2017   Procedure: HOT HEMOSTASIS (ARGON PLASMA COAGULATION/BICAP);  Surgeon: [name], MD;  Location: [place];  Service: Gastroenterology;  Laterality: N/A;   HOT HEMOSTASIS N/A 10/01/2017   Procedure: HOT HEMOSTASIS (ARGON PLASMA COAGULATION/BICAP);  Surgeon: [name], MD;  Location: [place];  Service: Gastroenterology;  Laterality: N/A;   HOT HEMOSTASIS N/A 10/17/2017   Procedure: HOT HEMOSTASIS (ARGON PLASMA COAGULATION/BICAP);  Surgeon: Otis Brace, MD;  Location: [facility] ENDOSCOPY;  Service: Gastroenterology;  Laterality: N/A;   HOT HEMOSTASIS N/A 10/19/2017   Procedure: HOT HEMOSTASIS (ARGON PLASMA COAGULATION/BICAP);  Surgeon:  Otis Brace, MD;  Location: [place];  Service: Gastroenterology;  Laterality: N/A;   HOT HEMOSTASIS N/A 03/02/2020   Procedure: HOT HEMOSTASIS (ARGON PLASMA COAGULATION/BICAP);  Surgeon: Ronald Lobo, MD;  Location[facility];  Service: Endoscopy;  Laterality: N/A;   IR IMAGING GUIDED PORT INSERTION  07/08/2018   L shoulder Surgery  2011   POLYPECTOMY  03/02/2020   Procedure: POLYPECTOMY;  Surgeon: Ronald Lobo, MD;  Location: [facility] ENDOSCOPY;  Service: Endoscopy;;   SUBMUCOSAL INJECTION  09/22/2017   Procedure: SUBMUCOSAL INJECTION;  Surgeon: Clarene Essex, MD;  Location: [facility] ENDOSCOPY;  Service: Endoscopy;;   SUBMUCOSAL INJECTION  12/04/2018   Procedure: SUBMUCOSAL INJECTION;  Surgeon: Wilford Corner, MD;  Location[facility] ENDOSCOPY;  Service: Endoscopy;;     OB History   No obstetric history on file.     Family History  Problem Relation Age of Onset   Cancer Mother        Ovarian   Ovarian cancer Mother    Diabetes Mother    Kidney disease Mother    Bleeding Disorder Mother    Diabetes type II Sister    Bleeding Disorder Sister    Diabetes Sister    Colon cancer Maternal Grandfather    Crohn's disease Maternal Grandfather    Stomach cancer Maternal Grandmother    Kidney disease Son    Bleeding Disorder Son    Dysmenorrhea Neg Hx     Social History   Tobacco Use   Smoking status: Every Day    Packs/day: 0.50    Years: 30.00    Pack years: 15.00    Types: Cigarettes   Smokeless tobacco: Former    Types: Snuff    Quit date: 1981   Tobacco comments:    1/2 PPD  Vaping Use   Vaping Use: Never used  Substance Use Topics   Alcohol use: No    Alcohol/week: 0.0 standard drinks   Drug use: No    Comment: last cocaine-2010    Home Medications Prior to Admission medications   Medication Sig Start Date End Date Taking? Authorizing Provider  Accu-Chek Softclix Lancets lancets Use to check blood sugar before breakfast and before dinner while on steroids Patient taking differently: 1 each by Other route See admin instructions. Use to check blood sugar before breakfast and before dinner while on steroids 09/27/19  Yes [name], MD  acitretin (SORIATANE)  10 MG capsule Take 10 mg by mouth daily. 01/03/20  Yes [provider]  albuterol (VENTOLIN HFA) 108 (90 Base) MCG/ACT inhaler Inhale 1-2 puffs into the lungs every 6 (six) hours as needed for wheezing or shortness of breath. 07/07/20  Yes Truitt Merle, MD  ALPRAZolam Duanne Moron) 0.5 MG tablet TAKE 1 TABLET(0.5 MG) BY MOUTH AT BEDTIME AS NEEDED FOR ANXIETY Patient taking differently: Take 0.5 mg by mouth at bedtime as needed for anxiety. 10/20/20  Yes Truitt Merle, MD  Aminocaproic Acid 1000 MG TABS Take 1 tablet (1,000 mg total) by mouth in the morning and at bedtime. TAKE 1 TABLET(1000 MG) BY MOUTH TWICE DAILY Patient taking differently: Take 1,000 mg by mouth in the morning and at bedtime. 05/13/20  Yes Truitt Merle, MD  amLODipine (NORVASC) 10 MG tablet TAKE 1 TABLET(10 MG) BY MOUTH DAILY Patient taking differently: Take 10 mg by mouth daily. 08/16/20  Yes Alla Feeling, NP  atorvastatin (LIPITOR) 80 MG tablet TAKE 1 TABLET(80 MG) BY MOUTH DAILY Patient taking differently: Take 80 mg by mouth daily. TAKE 1  TABLET(80 MG) BY MOUTH DAILY 12/16/19  Yes Truitt Merle, MD  diphenoxylate-atropine (LOMOTIL) 2.5-0.025 MG tablet 1 to 2 PO QID prn diarrhea Patient taking differently: Take 1-2 tablets by mouth 4 (four) times daily as needed for diarrhea or loose stools. 04/15/19  Yes Tanner, Lyndon Code., PA-C  furosemide (LASIX) 40 MG tablet Take 40 mg by mouth daily.   Yes [provider]  glucose blood (ACCU-CHEK GUIDE) test strip Check blood sugar 2 times per day while on steroids before breakfast and dinner Patient taking differently: 1 each by Other route See admin instructions. Check blood sugar 2 times per day while on steroids before breakfast and dinner 09/27/19  Yes [name], MD  hydrochlorothiazide (HYDRODIURIL) 12.5 MG tablet TAKE 1 TABLET(12.5 MG) BY MOUTH DAILY Patient taking differently: Take 12.5 mg by mouth in the morning and at bedtime. 05/25/20  Yes Cato Mulligan, MD  lidocaine-prilocaine  (EMLA) cream Apply 1 application topically as needed. Patient taking differently: Apply 1 application topically as needed. 07/17/18  Yes Truitt Merle, MD  metFORMIN (GLUCOPHAGE) 500 MG tablet TAKE 1 TABLET(500 MG) BY MOUTH TWICE DAILY WITH A MEAL Patient taking differently: Take 500 mg by mouth 2 (two) times daily with a meal. 10/23/20  Yes Sanjuan Dame, MD  metoprolol succinate (TOPROL-XL) 100 MG 24 hr tablet Take 100 mg by mouth daily.   Yes [provider]  pantoprazole (PROTONIX) 40 MG tablet TAKE 1 TABLET(40 MG) BY MOUTH TWICE DAILY Patient taking differently: Take 40 mg by mouth 2 (two) times daily. 08/24/20  Yes [name], MD  Podiatric Products (FLEXITOL HEEL BALM) OINT Apply 1 application topically as needed (dry skin).  06/10/19  Yes [provider]  potassium chloride (KLOR-CON) 20 MEQ packet Take 20 mEq by mouth daily. 05/23/20  Yes Truitt Merle, MD  prochlorperazine (COMPAZINE) 10 MG tablet Take 1 tablet (10 mg total) by mouth every 6 (six) hours as needed for nausea or vomiting. 10/14/18  Yes Alla Feeling, NP  sertraline (ZOLOFT) 25 MG tablet Take 1 tablet (25 mg total) by mouth daily. 10/23/20  Yes Truitt Merle, MD  terbinafine (LAMISIL AT) 1 % cream Apply 1 application topically 2 (two) times daily. Both bottom and top of both feet and toes 06/16/20  Yes Seawell, Jaimie A, DO  traMADol (ULTRAM) 50 MG tablet Take 1 tablet (50 mg total) by mouth daily as needed for severe pain. 09/27/20  Yes Alla Feeling, NP  traZODone (DESYREL) 100 MG tablet TAKE 1 TABLET(100 MG) BY MOUTH AT BEDTIME Patient taking differently: Take 100 mg by mouth at bedtime. 09/14/20  Yes Cato Mulligan, MD  cetirizine (ZYRTEC) 10 MG tablet TAKE 1 TABLET(10 MG) BY MOUTH DAILY Patient not taking: Reported on 11/01/2020 10/20/20   Axel Filler, MD  ciprofloxacin (CIPRO) 500 MG tablet Take 1 tablet (500 mg total) by mouth 2 (two) times daily. Patient not taking: Reported on 11/01/2020 09/27/20    Alla Feeling, NP  ondansetron (ZOFRAN ODT) 8 MG disintegrating tablet 8mg  ODT q4 hours prn nausea Patient not taking: Reported on 11/01/2020 09/14/20   Mesner, Corene Cornea, MD    Allergies    Feraheme [ferumoxytol], Nsaids, Tomato, Iron (ferrous sulfate) [ferrous sulfate er], Other, and Wasp venom  Review of Systems   Review of Systems  Respiratory:  Negative for shortness of breath.   Cardiovascular:  Negative for chest pain.  Gastrointestinal:  Positive for blood in stool and nausea. Negative for abdominal pain.  Musculoskeletal:  Positive for back pain.  Neurological:  Positive for syncope and light-headedness. Negative for headaches.  All other systems reviewed and are negative.  Physical Exam Updated Vital Signs BP 137/74   Pulse (!) 57   Temp 98.3 F (36.8 C) (Oral)   Resp 20   SpO2 97%   Physical Exam Vitals and nursing note reviewed.  Constitutional:      General: She is not in acute distress.    Appearance: She is well-developed. She is obese.  HENT:     Head: Normocephalic and atraumatic.     Right Ear: External ear normal.     Left Ear: External ear normal.     Nose: Nose normal.  Eyes:     General:        Right eye: No discharge.        Left eye: No discharge.  Cardiovascular:     Rate and Rhythm: Normal rate and regular rhythm.     Heart sounds: Normal heart sounds.  Pulmonary:     Effort: Pulmonary effort is normal.     Breath sounds: Normal breath sounds.  Abdominal:     General: There is no distension.     Palpations: Abdomen is soft.     Tenderness: There is no abdominal tenderness.  Genitourinary:    Comments: Large amount of dark red bloody stool in diaper Musculoskeletal:     Lumbar back: Tenderness present.  Skin:    General: Skin is warm and dry.  Neurological:     Mental Status: She is alert.     Comments: CN 3-12 grossly intact. 5/5 strength in both upper extremities. Exam somewhat limited in lower extremities due to the pain this causes in  her back. Strength is equal in legs. Grossly normal sensation. Normal finger to nose.   Psychiatric:        Mood and Affect: Mood is not anxious.    ED Results / Procedures / Treatments   Labs (all labs ordered are listed, but only abnormal results are displayed) Labs Reviewed  CBC WITH DIFFERENTIAL/PLATELET - Abnormal; Notable for the following components:      Result Value   WBC 14.0 (*)    RBC 2.28 (*)    Hemoglobin 6.0 (*)    HCT 21.0 (*)    MCHC 28.6 (*)    RDW 22.2 (*)    Neutro Abs 11.3 (*)    Abs Immature Granulocytes 0.12 (*)    All other components within normal limits  COMPREHENSIVE METABOLIC PANEL - Abnormal; Notable for the following components:   Potassium 3.0 (*)    Glucose, Bld 140 (*)    Creatinine, Ser 1.02 (*)    Calcium 8.1 (*)    Total Protein 5.2 (*)    Albumin 2.4 (*)    All other components within normal limits  PROTIME-INR - Abnormal; Notable for the following components:   Prothrombin Time 15.7 (*)    INR 1.3 (*)    All other components within normal limits  RESP PANEL BY RT-PCR (FLU A&B, COVID) ARPGX2  HEMOGLOBIN AND HEMATOCRIT, BLOOD  TYPE AND SCREEN  PREPARE RBC (CROSSMATCH)    EKG EKG Interpretation  Date/Time:  Wednesday November 01 2020 09:48:10 EDT Ventricular Rate:  56 PR Interval:  188 QRS Duration: 86 QT Interval:  500 QTC Calculation: 483 R Axis:   -17 Text Interpretation: Sinus rhythm Borderline left axis deviation Borderline T abnormalities, anterior leads Confirmed by Sherwood Gambler [phone]) on 11/01/2020 11:07:08 AM  Radiology CT Angio Abd/Pel W and/or Wo Contrast  Result Date: 11/01/2020 CLINICAL DATA:  Abdominal pain, nausea, vomiting, rectal bleeding EXAM: CTA ABDOMEN AND PELVIS WITHOUT AND WITH CONTRAST TECHNIQUE: Multidetector CT imaging of the abdomen and pelvis was performed using the standard protocol during bolus administration of intravenous contrast. Multiplanar reconstructed images and MIPs were obtained and reviewed  to evaluate the vascular anatomy. CONTRAST:  176mL OMNIPAQUE IOHEXOL 350 MG/ML SOLN COMPARISON:  CT 09/14/2020 FINDINGS: VASCULAR Aorta: Mild calcified plaque.  No aneurysm, dissection, or stenosis. Celiac: Patent without evidence of aneurysm, dissection, vasculitis or significant stenosis. SMA: Replaced right arterial supply, an anatomic variant. No aneurysm or stenosis. Renals: Single left, with calcified ostial plaque resulting in short segment stenosis of at least mild severity. Single right, widely patent. IMA: Patent without evidence of aneurysm, dissection, vasculitis or significant stenosis. Inflow: Calcified nonocclusive plaque through bilateral common iliac arteries and in the proximal internal iliac arteries. The external iliac arteries are unremarkable. Proximal Outflow: Mildly atheromatous, patent Veins: Patent patent veins, portal vein, SM V, splenic vein, bilateral renal veins, IVC, iliac venous system. No venous pathology identified. Review of the MIP images confirms the above findings. NON-VASCULAR Lower chest: Linear scarring or subsegmental atelectasis posteriorly at the lung bases. No pleural or pericardial effusion. Hepatobiliary: 7 mm calcification near the gallbladder neck. The gallbladder is nondistended. No focal liver lesion or biliary ductal dilatation. Pancreas: Unremarkable. No pancreatic ductal dilatation or surrounding inflammatory changes. Spleen: Normal in size without focal abnormality. Adrenals/Urinary Tract: Adrenal glands unremarkable. 1.7 cm probable cyst, right upper pole kidney. No hydronephrosis. Urinary bladder incompletely distended. Stomach/Bowel: Stomach is distended by ingested material. Surgical clips along the lesser and greater curvature of the stomach. Hyperdense regions along the posterior wall in the gastric body on the arterial phase scan, without definite accumulation or persistence on the delayed scans to suggest active bleeding. The small bowel is decompressed.  Normal appendix. The colon is nondilated, unremarkable. No definite active GI bleed. Lymphatic: No abdominal or pelvic adenopathy. Reproductive: Status post hysterectomy. No adnexal masses. Other: No ascites.  No free air. Musculoskeletal: Coarse calcifications in the anterior pelvic body wall presumably postoperative. Small paraumbilical hernia containing only mesenteric fat. No fracture or worrisome bone lesion. IMPRESSION: 1. Enhancement in the posterior mucosa of the stomach without convincing CT evidence of active hemorrhage. Correlate with endoscopic findings. 2. No evidence of acute GI bleed. 3. Aortoiliac  atherosclerosis (ICD10-170.0). Electronically Signed   By: Lucrezia Europe M.D.   On: 11/01/2020 09:18    Procedures .Critical Care  Date/Time: 11/01/2020 4:06 PM Performed by: Sherwood Gambler, MD Authorized by: Sherwood Gambler, MD   Critical care provider statement:    Critical care time (minutes):  35   Critical care time was exclusive of:  Separately billable procedures and treating other patients   Critical care was necessary to treat or prevent imminent or life-threatening deterioration of the following conditions:  Circulatory failure   Critical care was time spent personally by me on the following activities:  Discussions with consultants, evaluation of patient's response to treatment, examination of patient, ordering and performing treatments and interventions, ordering and review of laboratory studies, ordering and review of radiographic studies, pulse oximetry, re-evaluation of patient's condition, obtaining history from patient or surrogate and review of old charts   Medications Ordered in ED Medications  senna-docusate (Senokot-S) tablet 1 tablet (has no administration in time range)  acetaminophen (TYLENOL) tablet 650 mg (has no administration in time range)  Or  acetaminophen (TYLENOL) suppository 650 mg (has no administration in time range)  polyethylene glycol-electrolytes  (NuLYTELY) solution 4,000 mL (has no administration in time range)  lactated ringers bolus 1,000 mL (0 mLs Intravenous Stopped 11/01/20 0752)  fentaNYL (SUBLIMAZE) injection 50 mcg (50 mcg Intravenous Given 11/01/20 0740)  ondansetron (ZOFRAN) injection 4 mg (4 mg Intravenous Given 11/01/20 0741)  0.9 %  sodium chloride infusion (0 mL/hr Intravenous Stopped 11/01/20 1140)  potassium chloride 10 mEq in 100 mL IVPB (0 mEq Intravenous Stopped 11/01/20 1140)  iohexol (OMNIPAQUE) 350 MG/ML injection 100 mL (100 mLs Intravenous Contrast Given 11/01/20 0847)  pantoprazole (PROTONIX) injection 40 mg (40 mg Intravenous Given 11/01/20 1024)  HYDROmorphone (DILAUDID) injection 1 mg (1 mg Intravenous Given 11/01/20 1024)    ED Course  I have reviewed the triage vital signs and the nursing notes.  Pertinent labs & imaging results that were available during my care of the patient were reviewed by me and considered in my medical decision making (see chart for details).    MDM Rules/Calculators/A&P                           Patient was transiently hypotensive when she first arrived but this is resolved.  She is complaining of a lot of low back pain but no obvious injuries found.  CT was obtained to rule out obvious IR candidate for bleeding. This was essentially negative.  She hit her head but no headache, and has benign neuro exam. [facility] consulted, and given IV protonix. Will give unit of blood. Admit to teaching service.  Final Clinical Impression(s) / ED Diagnoses Final diagnoses:  Symptomatic anemia  Acute GI bleeding    Rx / DC Orders ED Discharge Orders     None        Sherwood Gambler, MD 11/01/20 1606

## 2020-11-01 NOTE — ED Notes (Signed)
Pt unable to get up to do orthostatic vitals

## 2020-11-01 NOTE — H&P (View-Only) (Signed)
Referring Provider: [facility] Primary Care Physician:  [name], MD Primary Gastroenterologist:  Dr. Michail Sermon  Reason for Consultation:  Rectal bleeding, anemia  HPI: Peggy House is a 60 y.o. female with history of hereditary hemorrhagic telangiectasia and recurrent GI bleeding presenting for consultation of rectal bleeding and anemia.  Patient was brought in by EMS after having a syncopal episode this morning.  She states she had a large, red bloody bowel movement that lasted about 45 minutes.  She has also had multiple episodes of epistaxis over the last couple of days.  She has some nausea but denies any vomiting.  She also reports 2 days of melena approximately 1 week ago which resolved.  Denies any recent changes in appetite or unexplained weight loss.  Patient follows with Dr. Burr Medico for her hereditary hemorrhagic telangiectasia and iron deficiency anemia.  She is on IV ferric gluconate, Avastin/Zirabev q 2 weeks and Amicar 1 g BID, reduced to PRN due to DVT 12/10/2018.  Receives PRN blood transfusions.  Denies NSAID, aspirin, or blood thinner use.  GI procedures noted below:  EGD 02/2020: Small HH, No blood in the stomach or source of bleeding seen on this exam.  Colonoscopy 02/2020: Two non-bleeding colonic angioectasias, treated with APC. One small polyp in the sigmoid colon, Six 4 to 7 mm polyps at the recto-sigmoid  (bx: hyperplastic plyps)  12/04/2018 EGD: dieulafoy lesion with oozing and bleeding and stigmata of recent bleeding, injected with epinephrine and 2 hemostatic clips were successfully placed to stop the bleeding. Acute gastritis was also seen.  10/19/2017 EGD showed small hiatal hernia, esophagus normal, a few bleeding angiectasia is in the gastric fundus and gastric body. One AVM treated with a ACP.  10/02/2017 capsule endoscopy was unsuccessful due to large amount of retained food obscured visualization.   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 ENDOSCOPY;  Service: Endoscopy;  Laterality: N/A;   DG TOES*L*  2/10   rt   DILATION AND CURETTAGE OF UTERUS     ENTEROSCOPY N/A 10/17/2017   Procedure: ENTEROSCOPY;  Surgeon: Otis Brace, MD;  Location: [place];  Service: Gastroenterology;  Laterality: N/A;   ESOPHAGOGASTRODUODENOSCOPY N/A 04/10/2014   Procedure: ESOPHAGOGASTRODUODENOSCOPY (EGD);  Surgeon: [name], MD;  Location[facility] ENDOSCOPY;  Service: Endoscopy;  Laterality: N/A;   ESOPHAGOGASTRODUODENOSCOPY N/A 05/10/2017   Procedure: ESOPHAGOGASTRODUODENOSCOPY (EGD);  Surgeon: Ronald Lobo, MD;  Location: [facility] ENDOSCOPY;  Service: Endoscopy;  Laterality: N/A;   ESOPHAGOGASTRODUODENOSCOPY N/A 09/22/2017   Procedure:  ESOPHAGOGASTRODUODENOSCOPY (EGD);  Surgeon: Clarene Essex, MD;  Location: [place];  Service: Endoscopy;  Laterality: N/A;  bedside   ESOPHAGOGASTRODUODENOSCOPY N/A 03/02/2020   Procedure: ESOPHAGOGASTRODUODENOSCOPY (EGD);  Surgeon: Ronald Lobo, MD;   Location: [facility];  Service: Endoscopy;  Laterality: N/A;   ESOPHAGOGASTRODUODENOSCOPY (EGD) WITH PROPOFOL N/A 04/27/2014   Procedure: ESOPHAGOGASTRODUODENOSCOPY (EGD) WITH PROPOFOL;  Surgeon: Cleotis Nipper, MD;  Location: [place];  Service: Endoscopy;  Laterality: N/A;  possible apc   ESOPHAGOGASTRODUODENOSCOPY (EGD) WITH PROPOFOL N/A 09/30/2017   Procedure: ESOPHAGOGASTRODUODENOSCOPY (EGD) WITH PROPOFOL;  Surgeon: [name], MD;  Location: [place];  Service: Gastroenterology;  Laterality: N/A;   ESOPHAGOGASTRODUODENOSCOPY (EGD) WITH PROPOFOL N/A 10/01/2017   Procedure: ESOPHAGOGASTRODUODENOSCOPY (EGD) WITH PROPOFOL;  Surgeon: [name], MD;  Location: [place];  Service: Gastroenterology;  Laterality: N/A;   ESOPHAGOGASTRODUODENOSCOPY (EGD) WITH PROPOFOL N/A 10/08/2017   Procedure: ESOPHAGOGASTRODUODENOSCOPY (EGD) WITH PROPOFOL;  Surgeon: Otis Brace, MD;  Location: [place];  Service: Gastroenterology;  Laterality: N/A;   ESOPHAGOGASTRODUODENOSCOPY (EGD) WITH PROPOFOL N/A 10/17/2017   Procedure: ESOPHAGOGASTRODUODENOSCOPY (EGD) WITH PROPOFOL;  Surgeon: Otis Brace, MD;  Location: [place];  Service: Gastroenterology;  Laterality: N/A;   ESOPHAGOGASTRODUODENOSCOPY (EGD) WITH PROPOFOL N/A 10/19/2017   Procedure: ESOPHAGOGASTRODUODENOSCOPY (EGD) WITH PROPOFOL;  Surgeon: Otis Brace, MD;  Location: [place];  Service: Gastroenterology;  Laterality: N/A;   ESOPHAGOGASTRODUODENOSCOPY (EGD) WITH PROPOFOL N/A 12/04/2018   Procedure: ESOPHAGOGASTRODUODENOSCOPY (EGD) WITH PROPOFOL;  Surgeon: Wilford Corner, MD;  Location: [facility] ENDOSCOPY;  Service: Endoscopy;  Laterality: N/A;   GIVENS CAPSULE STUDY N/A 10/02/2017   Procedure: GIVENS CAPSULE STUDY;  Surgeon: [name], MD;  Location: [place];  Service: Gastroenterology;  Laterality: N/A;   GIVENS CAPSULE STUDY N/A 10/08/2017   Procedure: GIVENS CAPSULE STUDY;  Surgeon: Otis Brace, MD;  Location:  [place];  Service: Gastroenterology;  Laterality: N/A;  endoscopic placement of capsule   GIVENS CAPSULE STUDY N/A 03/02/2020   Procedure: GIVENS CAPSULE STUDY;  Surgeon: Ronald Lobo, MD;  Location: [facility];  Service: Endoscopy;  Laterality: N/A;   HEMOSTASIS CLIP PLACEMENT  12/04/2018   Procedure: HEMOSTASIS CLIP PLACEMENT;  Surgeon: Wilford Corner, MD;  Location: [facility];  Service: Endoscopy;;   HOT HEMOSTASIS N/A 04/27/2014   Procedure: HOT HEMOSTASIS (ARGON PLASMA COAGULATION/BICAP);  Surgeon: Cleotis Nipper, MD;  Location: [facility] ENDOSCOPY;  Service: Endoscopy;  Laterality: N/A;   HOT HEMOSTASIS N/A 09/30/2017   Procedure: HOT HEMOSTASIS (ARGON PLASMA COAGULATION/BICAP);  Surgeon: [name], MD;  Location: [place];  Service: Gastroenterology;  Laterality: N/A;   HOT HEMOSTASIS N/A 10/01/2017   Procedure: HOT HEMOSTASIS (ARGON PLASMA COAGULATION/BICAP);  Surgeon: [name], MD;  Location: [place];  Service: Gastroenterology;  Laterality: N/A;   HOT HEMOSTASIS N/A 10/17/2017   Procedure: HOT HEMOSTASIS (ARGON PLASMA COAGULATION/BICAP);  Surgeon: Otis Brace, MD;  Location: [facility] ENDOSCOPY;  Service: Gastroenterology;  Laterality: N/A;   HOT HEMOSTASIS N/A 10/19/2017   Procedure: HOT HEMOSTASIS (ARGON PLASMA COAGULATION/BICAP);  Surgeon: Otis Brace, MD;  Location: [facility] ENDOSCOPY;  Service: Gastroenterology;  Laterality: N/A;   HOT HEMOSTASIS N/A 03/02/2020   Procedure: HOT HEMOSTASIS (ARGON PLASMA COAGULATION/BICAP);  Surgeon: Ronald Lobo, MD;  Location: [facility];  Service: Endoscopy;  Laterality: N/A;   IR IMAGING GUIDED PORT INSERTION  07/08/2018   L shoulder Surgery  2011   POLYPECTOMY  03/02/2020   Procedure: POLYPECTOMY;  Surgeon: Ronald Lobo, MD;  Location: [facility] ENDOSCOPY;  Service: Endoscopy;;   SUBMUCOSAL INJECTION  09/22/2017   Procedure: SUBMUCOSAL INJECTION;  Surgeon: Watt Climes,  Altamese Dilling, MD;  Location: [facility] ENDOSCOPY;  Service: Endoscopy;;    SUBMUCOSAL INJECTION  12/04/2018   Procedure: SUBMUCOSAL INJECTION;  Surgeon: Wilford Corner, MD;  Location: [facility] ENDOSCOPY;  Service: Endoscopy;;    Prior to Admission medications   Medication Sig Start Date End Date Taking? Authorizing Provider  Accu-Chek Softclix Lancets lancets Use to check blood sugar before breakfast and before dinner while on steroids Patient taking differently: 1 each by Other route See admin instructions. Use to check blood sugar before breakfast and before dinner while on steroids 09/27/19  Yes [name], MD  acitretin (SORIATANE) 10 MG capsule Take 10 mg by mouth daily. 01/03/20  Yes [provider]  albuterol (VENTOLIN HFA) 108 (90 Base) MCG/ACT inhaler Inhale 1-2 puffs into the lungs every 6 (six) hours as needed for wheezing or shortness of breath. 07/07/20  Yes Truitt Merle, MD  ALPRAZolam Duanne Moron) 0.5 MG tablet TAKE 1 TABLET(0.5 MG) BY MOUTH AT BEDTIME AS NEEDED FOR ANXIETY Patient taking differently: Take 0.5 mg by mouth at bedtime as needed for anxiety. 10/20/20  Yes Truitt Merle, MD  Aminocaproic Acid 1000 MG TABS Take 1 tablet (1,000 mg total) by mouth in the morning and at bedtime. TAKE 1 TABLET(1000 MG) BY MOUTH TWICE DAILY Patient taking differently: Take 1,000 mg by mouth in the morning and at bedtime. 05/13/20  Yes Truitt Merle, MD  amLODipine (NORVASC) 10 MG tablet TAKE 1 TABLET(10 MG) BY MOUTH DAILY Patient taking differently: Take 10 mg by mouth daily. 08/16/20  Yes Alla Feeling, NP  atorvastatin (LIPITOR) 80 MG tablet TAKE 1 TABLET(80 MG) BY MOUTH DAILY Patient taking differently: Take 80 mg by mouth daily. TAKE 1 TABLET(80 MG) BY MOUTH DAILY 12/16/19  Yes Truitt Merle, MD  diphenoxylate-atropine (LOMOTIL) 2.5-0.025 MG tablet 1 to 2 PO QID prn diarrhea Patient taking differently: Take 1-2 tablets by mouth 4 (four) times daily as needed for diarrhea or loose stools. 04/15/19  Yes Tanner, Lyndon Code., PA-C  furosemide (LASIX) 40 MG tablet Take 40 mg by mouth  daily.   Yes [provider]  glucose blood (ACCU-CHEK GUIDE) test strip Check blood sugar 2 times per day while on steroids before breakfast and dinner Patient taking differently: 1 each by Other route See admin instructions. Check blood sugar 2 times per day while on steroids before breakfast and dinner 09/27/19  Yes [name], MD  hydrochlorothiazide (HYDRODIURIL) 12.5 MG tablet TAKE 1 TABLET(12.5 MG) BY MOUTH DAILY Patient taking differently: Take 12.5 mg by mouth in the morning and at bedtime. 05/25/20  Yes Cato Mulligan, MD  lidocaine-prilocaine (EMLA) cream Apply 1 application topically as needed. Patient taking differently: Apply 1 application topically as needed. 07/17/18  Yes Truitt Merle, MD  metFORMIN (GLUCOPHAGE) 500 MG tablet TAKE 1 TABLET(500 MG) BY MOUTH TWICE DAILY WITH A MEAL Patient taking differently: Take 500 mg by mouth 2 (two) times daily with a meal. 10/23/20  Yes Sanjuan Dame, MD  metoprolol succinate (TOPROL-XL) 100 MG 24 hr tablet Take 100 mg by mouth daily.   Yes [provider]  pantoprazole (PROTONIX) 40 MG tablet TAKE 1 TABLET(40 MG) BY MOUTH TWICE DAILY Patient taking differently: Take 40 mg by mouth 2 (two) times daily. 08/24/20  Yes [name], MD  Podiatric Products (FLEXITOL HEEL BALM) OINT Apply 1 application topically as needed (dry skin).  06/10/19  Yes [provider]  potassium chloride (KLOR-CON) 20 MEQ packet Take 20 mEq by mouth daily. 05/23/20  Yes  Truitt Merle, MD  prochlorperazine (COMPAZINE) 10 MG tablet Take 1 tablet (10 mg total) by mouth every 6 (six) hours as needed for nausea or vomiting. 10/14/18  Yes Alla Feeling, NP  sertraline (ZOLOFT) 25 MG tablet Take 1 tablet (25 mg total) by mouth daily. 10/23/20  Yes Truitt Merle, MD  terbinafine (LAMISIL AT) 1 % cream Apply 1 application topically 2 (two) times daily. Both bottom and top of both feet and toes 06/16/20  Yes Seawell, Jaimie A, DO  traMADol (ULTRAM) 50 MG  tablet Take 1 tablet (50 mg total) by mouth daily as needed for severe pain. 09/27/20  Yes Alla Feeling, NP  traZODone (DESYREL) 100 MG tablet TAKE 1 TABLET(100 MG) BY MOUTH AT BEDTIME Patient taking differently: Take 100 mg by mouth at bedtime. 09/14/20  Yes Cato Mulligan, MD  cetirizine (ZYRTEC) 10 MG tablet TAKE 1 TABLET(10 MG) BY MOUTH DAILY Patient not taking: Reported on 11/01/2020 10/20/20   Axel Filler, MD  ciprofloxacin (CIPRO) 500 MG tablet Take 1 tablet (500 mg total) by mouth 2 (two) times daily. Patient not taking: Reported on 11/01/2020 09/27/20   Alla Feeling, NP  ondansetron (ZOFRAN ODT) 8 MG disintegrating tablet 8mg  ODT q4 hours prn nausea Patient not taking: Reported on 11/01/2020 09/14/20   Mesner, Corene Cornea, MD    Scheduled Meds: Continuous Infusions: PRN Meds:.acetaminophen **OR** acetaminophen, senna-docusate  Allergies as of 11/01/2020 - Review Complete 11/01/2020  Allergen Reaction Noted   Feraheme [ferumoxytol] Other (See Comments) 10/09/2017   Nsaids Other (See Comments) 04/25/2014   Tomato Hives 01/01/2012   Iron (ferrous sulfate) [ferrous sulfate er]  03/07/2020   Other Other (See Comments) 03/07/2020   Wasp venom Swelling 10/06/2017    Family History  Problem Relation Age of Onset   Cancer Mother        Ovarian   Ovarian cancer Mother    Diabetes Mother    Kidney disease Mother    Bleeding Disorder Mother    Diabetes type II Sister    Bleeding Disorder Sister    Diabetes Sister    Colon cancer Maternal Grandfather    Crohn's disease Maternal Grandfather    Stomach cancer Maternal Grandmother    Kidney disease Son    Bleeding Disorder Son    Dysmenorrhea Neg Hx     Social History   Socioeconomic History   Marital status: Single    Spouse name: Not on file   Number of children: Not on file   Years of education: Not on file   Highest education level: Not on file  Occupational History   Not on file  Tobacco Use   Smoking status:  Every Day    Packs/day: 0.50    Years: 30.00    Pack years: 15.00    Types: Cigarettes   Smokeless tobacco: Former    Types: Snuff    Quit date: 1981   Tobacco comments:    1/2 PPD  Vaping Use   Vaping Use: Never used  Substance and Sexual Activity   Alcohol use: No    Alcohol/week: 0.0 standard drinks   Drug use: No    Comment: last cocaine-2010   Sexual activity: Yes    Birth control/protection: Surgical    Comment: Hysterectomy  Other Topics Concern   Not on file  Social History Narrative   Not on file   Social Determinants of Health   Financial Resource Strain: Not on file  Food Insecurity: Not on file  Transportation Needs: Not on file  Physical Activity: Not on file  Stress: Not on file  Social Connections: Not on file  Intimate Partner Violence: Not on file    Review of Systems: Review of Systems  Constitutional:  Negative for chills, fever and weight loss.  HENT:  Negative for hearing loss and tinnitus.   Eyes:  Negative for pain and redness.  Respiratory:  Negative for cough and shortness of breath.   Cardiovascular:  Negative for chest pain and palpitations.  Gastrointestinal:  Positive for blood in stool and nausea. Negative for abdominal pain, constipation, diarrhea, heartburn, melena and vomiting.  Genitourinary:  Negative for flank pain and hematuria.  Musculoskeletal:  Positive for back pain and falls.  Skin:  Negative for itching and rash.  Neurological:  Positive for loss of consciousness and weakness.  Psychiatric/Behavioral:  Negative for substance abuse. The patient is not nervous/anxious.     Physical Exam: Vital signs: Vitals:   11/01/20 1000 11/01/20 1015  BP: 124/61 138/71  Pulse: (!) 59 (!) 58  Resp: 12 13  Temp:    SpO2: 98% 98%      Physical Exam Vitals reviewed.  Constitutional:      General: She is not in acute distress.    Appearance: She is obese.  HENT:     Head: Normocephalic and atraumatic.     Nose: Nose normal.  No congestion.     Mouth/Throat:     Mouth: Mucous membranes are moist.     Pharynx: Oropharynx is clear.  Eyes:     Extraocular Movements: Extraocular movements intact.     Comments: Conjunctival pallor   Cardiovascular:     Rate and Rhythm: Normal rate and regular rhythm.  Pulmonary:     Effort: Pulmonary effort is normal. No respiratory distress.  Abdominal:     General: Bowel sounds are normal. There is no distension.     Palpations: Abdomen is soft. There is no mass.     Tenderness: There is abdominal tenderness (mild, diffuse). There is no guarding or rebound.     Hernia: No hernia is present.  Musculoskeletal:        General: No swelling or tenderness.     Cervical back: Normal range of motion and neck supple.  Skin:    General: Skin is warm and dry.  Neurological:     General: No focal deficit present.     Mental Status: She is oriented to person, place, and time. She is lethargic.  Psychiatric:        Mood and Affect: Mood normal.        Behavior: Behavior normal. Behavior is cooperative.     GI:  Lab Results: Recent Labs    11/01/20 0622  WBC 14.0*  HGB 6.0*  HCT 21.0*  PLT 252   BMET Recent Labs    11/01/20 0622  NA 138  K 3.0*  CL 106  CO2 25  GLUCOSE 140*  BUN 9  CREATININE 1.02*  CALCIUM 8.1*   LFT Recent Labs    11/01/20 0622  PROT 5.2*  ALBUMIN 2.4*  AST 16  ALT 10  ALKPHOS 48  BILITOT 0.5   PT/INR Recent Labs    11/01/20 0622  LABPROT 15.7*  INR 1.3*     Studies/Results: CT Angio Abd/Pel W and/or Wo Contrast  Result Date: 11/01/2020 CLINICAL DATA:  Abdominal pain, nausea, vomiting, rectal bleeding EXAM: CTA ABDOMEN AND PELVIS WITHOUT AND WITH CONTRAST TECHNIQUE: Multidetector CT imaging of  the abdomen and pelvis was performed using the standard protocol during bolus administration of intravenous contrast. Multiplanar reconstructed images and MIPs were obtained and reviewed to evaluate the vascular anatomy. CONTRAST:  127mL  OMNIPAQUE IOHEXOL 350 MG/ML SOLN COMPARISON:  CT 09/14/2020 FINDINGS: VASCULAR Aorta: Mild calcified plaque.  No aneurysm, dissection, or stenosis. Celiac: Patent without evidence of aneurysm, dissection, vasculitis or significant stenosis. SMA: Replaced right arterial supply, an anatomic variant. No aneurysm or stenosis. Renals: Single left, with calcified ostial plaque resulting in short segment stenosis of at least mild severity. Single right, widely patent. IMA: Patent without evidence of aneurysm, dissection, vasculitis or significant stenosis. Inflow: Calcified nonocclusive plaque through bilateral common iliac arteries and in the proximal internal iliac arteries. The external iliac arteries are unremarkable. Proximal Outflow: Mildly atheromatous, patent Veins: Patent patent veins, portal vein, SM V, splenic vein, bilateral renal veins, IVC, iliac venous system. No venous pathology identified. Review of the MIP images confirms the above findings. NON-VASCULAR Lower chest: Linear scarring or subsegmental atelectasis posteriorly at the lung bases. No pleural or pericardial effusion. Hepatobiliary: 7 mm calcification near the gallbladder neck. The gallbladder is nondistended. No focal liver lesion or biliary ductal dilatation. Pancreas: Unremarkable. No pancreatic ductal dilatation or surrounding inflammatory changes. Spleen: Normal in size without focal abnormality. Adrenals/Urinary Tract: Adrenal glands unremarkable. 1.7 cm probable cyst, right upper pole kidney. No hydronephrosis. Urinary bladder incompletely distended. Stomach/Bowel: Stomach is distended by ingested material. Surgical clips along the lesser and greater curvature of the stomach. Hyperdense regions along the posterior wall in the gastric body on the arterial phase scan, without definite accumulation or persistence on the delayed scans to suggest active bleeding. The small bowel is decompressed. Normal appendix. The colon is nondilated,  unremarkable. No definite active GI bleed. Lymphatic: No abdominal or pelvic adenopathy. Reproductive: Status post hysterectomy. No adnexal masses. Other: No ascites.  No free air. Musculoskeletal: Coarse calcifications in the anterior pelvic body wall presumably postoperative. Small paraumbilical hernia containing only mesenteric fat. No fracture or worrisome bone lesion. IMPRESSION: 1. Enhancement in the posterior mucosa of the stomach without convincing CT evidence of active hemorrhage. Correlate with endoscopic findings. 2. No evidence of acute GI bleed. 3. Aortoiliac  atherosclerosis (ICD10-170.0). Electronically Signed   By: Lucrezia Europe M.D.   On: 11/01/2020 09:18    Impression: Hematochezia, history of colonic AVMs -Hgb 6.0 -CT-A today did not show evidence of acute GI bleeding  Melena x2 days last week, resolved, history of gastric AVMs and dieulafoy lesion  -Enhancement in the posterior mucosa of the stomach without convincing CT evidence of active hemorrhage. Correlate with endoscopic findings.  Hereditary hemorrhagic telangiectasia, followed by Dr. Burr Medico  Plan: EGD and colonoscopy Friday 7/22 (no availability in endoscopy unit tomorrow). Plan for bowel prep tomorrow.  Patient currently stable with regular HR and BP.  Clear liquid diet.  Continue supportive care.  Transfuse as needed to maintain Hgb >7.  Protonix 40 mg IV BID.  [facility] will follow.    LOS: 0 days   Salley Slaughter  PA-C 11/01/2020, 12:42 PM  Contact #  [phone]

## 2020-11-01 NOTE — ED Triage Notes (Signed)
Pt BIB EMS from home due to fall this morning & rectal bleeding and nose bleeding. 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 Pt reports she had syncopal episode and fell and hit her head. Per ems pt lost about 532mls of blood. Pt is a&ox4 at the time of arrival

## 2020-11-01 NOTE — ED Provider Notes (Signed)
Emergency Medicine Provider Triage Evaluation Note  Peggy House , a 60 y.o. female  was evaluated briefly.  Pt complains of hematochezia, syncope now with head and back pain.  Review of Systems  Positive: Head pain, neck pain, syncope, GI bleeding, nausea Negative: N/a  Physical Exam  BP (!) 97/52   Pulse (!) 50   Temp 98.3 F (36.8 C)   Resp (!) 22   SpO2 99%  Gen:   Awake, no distress   Resp:  Normal effort  MSK:   Moves extremities without difficulty  Other:  Mild mid thoracic back ttp.  Medical Decision Making   BP improved from initial. Conversant. Appears stable.  Medically screening exam initiated at 6:42 AM.  Appropriate orders placed.  EMIKO OSORTO was informed that the remainder of the evaluation will be completed by another provider, this initial triage assessment does not replace that evaluation, and the importance of remaining in the ED until their evaluation is complete.   Ouida Abeyta, Corene Cornea, MD 11/01/20 [phone]

## 2020-11-01 NOTE — Consult Note (Signed)
Referring Provider: [facility] Primary Care Physician:  [name], MD Primary Gastroenterologist:  Dr. Michail Sermon  Reason for Consultation:  Rectal bleeding, anemia  HPI: Peggy House is a 60 y.o. female with history of hereditary hemorrhagic telangiectasia and recurrent GI bleeding presenting for consultation of rectal bleeding and anemia.  Patient was brought in by EMS after having a syncopal episode this morning.  She states she had a large, red bloody bowel movement that lasted about 45 minutes.  She has also had multiple episodes of epistaxis over the last couple of days.  She has some nausea but denies any vomiting.  She also reports 2 days of melena approximately 1 week ago which resolved.  Denies any recent changes in appetite or unexplained weight loss.  Patient follows with Dr. Burr Medico for her hereditary hemorrhagic telangiectasia and iron deficiency anemia.  She is on IV ferric gluconate, Avastin/Zirabev q 2 weeks and Amicar 1 g BID, reduced to PRN due to DVT 12/10/2018.  Receives PRN blood transfusions.  Denies NSAID, aspirin, or blood thinner use.  GI procedures noted below:  EGD 02/2020: Small HH, No blood in the stomach or source of bleeding seen on this exam.  Colonoscopy 02/2020: Two non-bleeding colonic angioectasias, treated with APC. One small polyp in the sigmoid colon, Six 4 to 7 mm polyps at the recto-sigmoid  (bx: hyperplastic plyps)  12/04/2018 EGD: dieulafoy lesion with oozing and bleeding and stigmata of recent bleeding, injected with epinephrine and 2 hemostatic clips were successfully placed to stop the bleeding. Acute gastritis was also seen.  10/19/2017 EGD showed small hiatal hernia, esophagus normal, a few bleeding angiectasia is in the gastric fundus and gastric body. One AVM treated with a ACP.  10/02/2017 capsule endoscopy was unsuccessful due to large amount of retained food obscured visualization.   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    Past Surgical History:  Procedure Laterality Date   ABDOMINAL HYSTERECTOMY     CARPAL TUNNEL RELEASE  05/13/2011   Procedure: CARPAL TUNNEL RELEASE;  Surgeon: Nita Sells, MD;  Location: [place];  Service: Orthopedics;  Laterality: Left;   COLONOSCOPY N/A 03/02/2020   Procedure: COLONOSCOPY;  Surgeon: Ronald Lobo, MD;  Location: [facility] ENDOSCOPY;  Service: Endoscopy;  Laterality: N/A;   COLONOSCOPY WITH PROPOFOL N/A 04/28/2014   Procedure: COLONOSCOPY WITH PROPOFOL;  Surgeon: Cleotis Nipper, MD;  Location[facility]OSCOPY;  Service: Endoscopy;  Laterality: N/A;   DG TOES*L*  2/10   rt   DILATION AND CURETTAGE OF UTERUS     ENTEROSCOPY N/A 10/17/2017   Procedure: ENTEROSCOPY;  Surgeon: Otis Brace, MD;  Location: [place];  Service: Gastroenterology;  Laterality: N/A;   ESOPHAGOGASTRODUODENOSCOPY N/A 04/10/2014   Procedure: ESOPHAGOGASTRODUODENOSCOPY (EGD);  Surgeon: [name], MD;  Location: [facility] ENDOSCOPY;  Service: Endoscopy;  Laterality: N/A;   ESOPHAGOGASTRODUODENOSCOPY N/A 05/10/2017   Procedure: ESOPHAGOGASTRODUODENOSCOPY (EGD);  Surgeon: Ronald Lobo, MD;  Location: [facility] ENDOSCOPY;  Service: Endoscopy;  Laterality: N/A;   ESOPHAGOGASTRODUODENOSCOPY N/A 09/22/2017   Procedure:  ESOPHAGOGASTRODUODENOSCOPY (EGD);  Surgeon: Clarene Essex, MD;  Location: [place];  Service: Endoscopy;  Laterality: N/A;  bedside   ESOPHAGOGASTRODUODENOSCOPY N/A 03/02/2020   Procedure: ESOPHAGOGASTRODUODENOSCOPY (EGD);  Surgeon: Ronald Lobo, MD;   Location: [facility];  Service: Endoscopy;  Laterality: N/A;   ESOPHAGOGASTRODUODENOSCOPY (EGD) WITH PROPOFOL N/A 04/27/2014   Procedure: ESOPHAGOGASTRODUODENOSCOPY (EGD) WITH PROPOFOL;  Surgeon: Cleotis Nipper, MD;  Location: [place];  Service: Endoscopy;  Laterality: N/A;  possible apc   ESOPHAGOGASTRODUODENOSCOPY (EGD) WITH PROPOFOL N/A 09/30/2017   Procedure: ESOPHAGOGASTRODUODENOSCOPY (EGD) WITH PROPOFOL;  Surgeon: [name], MD;  Location: [place];  Service: Gastroenterology;  Laterality: N/A;   ESOPHAGOGASTRODUODENOSCOPY (EGD) WITH PROPOFOL N/A 10/01/2017   Procedure: ESOPHAGOGASTRODUODENOSCOPY (EGD) WITH PROPOFOL;  Surgeon: [name], MD;  Location: [place];  Service: Gastroenterology;  Laterality: N/A;   ESOPHAGOGASTRODUODENOSCOPY (EGD) WITH PROPOFOL N/A 10/08/2017   Procedure: ESOPHAGOGASTRODUODENOSCOPY (EGD) WITH PROPOFOL;  Surgeon: Otis Brace, MD;  Location: [place];  Service: Gastroenterology;  Laterality: N/A;   ESOPHAGOGASTRODUODENOSCOPY (EGD) WITH PROPOFOL N/A 10/17/2017   Procedure: ESOPHAGOGASTRODUODENOSCOPY (EGD) WITH PROPOFOL;  Surgeon: Otis Brace, MD;  Location: [place];  Service: Gastroenterology;  Laterality: N/A;   ESOPHAGOGASTRODUODENOSCOPY (EGD) WITH PROPOFOL N/A 10/19/2017   Procedure: ESOPHAGOGASTRODUODENOSCOPY (EGD) WITH PROPOFOL;  Surgeon: Otis Brace, MD;  Location: [place];  Service: Gastroenterology;  Laterality: N/A;   ESOPHAGOGASTRODUODENOSCOPY (EGD) WITH PROPOFOL N/A 12/04/2018   Procedure: ESOPHAGOGASTRODUODENOSCOPY (EGD) WITH PROPOFOL;  Surgeon: Wilford Corner, MD;  Location: [facility] ENDOSCOPY;  Service: Endoscopy;  Laterality: N/A;   GIVENS CAPSULE STUDY N/A 10/02/2017   Procedure: GIVENS CAPSULE STUDY;  Surgeon: [name], MD;  Location: [place];  Service: Gastroenterology;  Laterality: N/A;   GIVENS CAPSULE STUDY N/A 10/08/2017   Procedure: GIVENS CAPSULE STUDY;  Surgeon: Otis Brace, MD;  Location:  [place];  Service: Gastroenterology;  Laterality: N/A;  endoscopic placement of capsule   GIVENS CAPSULE STUDY N/A 03/02/2020   Procedure: GIVENS CAPSULE STUDY;  Surgeon: Ronald Lobo, MD;  Location: [facility]OS[facility];  Service: Endoscopy;  Laterality: N/A;   HEMOSTASIS CLIP PLACEMENT  12/04/2018   Procedure: HEMOSTASIS CLIP PLACEMENT;  Surgeon: Wilford Corner, MD;  Location: [facility];  Service: Endoscopy;;   HOT HEMOSTASIS N/A 04/27/2014   Procedure: HOT HEMOSTASIS (ARGON PLASMA COAGULATION/BICAP);  Surgeon: Cleotis Nipper, MD;  Location: [facility] ENDOSCOPY;  Service: Endoscopy;  Laterality: N/A;   HOT HEMOSTASIS N/A 09/30/2017   Procedure: HOT HEMOSTASIS (ARGON PLASMA COAGULATION/BICAP);  Surgeon: [name], MD;  Location: [name];  Service: Gastroenterology;  Laterality: N/A;   HOT HEMOSTASIS N/A 10/01/2017   Procedure: HOT HEMOSTASIS (ARGON PLASMA COAGULATION/BICAP);  Surgeon: [name], MD;  Location: [place];  Service: Gastroenterology;  Laterality: N/A;   HOT HEMOSTASIS N/A 10/17/2017   Procedure: HOT HEMOSTASIS (ARGON PLASMA COAGULATION/BICAP);  Surgeon: Otis Brace, MD;  Location: [facility] ENDOSCOPY;  Service: Gastroenterology;  Laterality: N/A;   HOT HEMOSTASIS N/A 10/19/2017   Procedure: HOT HEMOSTASIS (ARGON PLASMA COAGULATION/BICAP);  Surgeon: Otis Brace, MD;  Location: [facility] ENDOSCOPY;  Service: Gastroenterology;  Laterality: N/A;   HOT HEMOSTASIS N/A 03/02/2020   Procedure: HOT HEMOSTASIS (ARGON PLASMA COAGULATION/BICAP);  Surgeon: Ronald Lobo, MD;  Location: [facility];  Service: Endoscopy;  Laterality: N/A;   IR IMAGING GUIDED PORT INSERTION  07/08/2018   L shoulder Surgery  2011   POLYPECTOMY  03/02/2020   Procedure: POLYPECTOMY;  Surgeon: Ronald Lobo, MD;  Location: [facility] ENDOSCOPY;  Service: Endoscopy;;   SUBMUCOSAL INJECTION  09/22/2017   Procedure: SUBMUCOSAL INJECTION;  Surgeon: Watt Climes,  Altamese Dilling, MD;  Location[facility] ENDOSCOPY;  Service: Endoscopy;;    SUBMUCOSAL INJECTION  12/04/2018   Procedure: SUBMUCOSAL INJECTION;  Surgeon: Wilford Corner, MD;  Location: [facility] ENDOSCOPY;  Service: Endoscopy;;    Prior to Admission medications   Medication Sig Start Date End Date Taking? Authorizing Provider  Accu-Chek Softclix Lancets lancets Use to check blood sugar before breakfast and before dinner while on steroids Patient taking differently: 1 each by Other route See admin instructions. Use to check blood sugar before breakfast and before dinner while on steroids 09/27/19  Yes [name], MD  acitretin (SORIATANE) 10 MG capsule Take 10 mg by mouth daily. 01/03/20  Yes [provider]  albuterol (VENTOLIN HFA) 108 (90 Base) MCG/ACT inhaler Inhale 1-2 puffs into the lungs every 6 (six) hours as needed for wheezing or shortness of breath. 07/07/20  Yes Truitt Merle, MD  ALPRAZolam Duanne Moron) 0.5 MG tablet TAKE 1 TABLET(0.5 MG) BY MOUTH AT BEDTIME AS NEEDED FOR ANXIETY Patient taking differently: Take 0.5 mg by mouth at bedtime as needed for anxiety. 10/20/20  Yes Truitt Merle, MD  Aminocaproic Acid 1000 MG TABS Take 1 tablet (1,000 mg total) by mouth in the morning and at bedtime. TAKE 1 TABLET(1000 MG) BY MOUTH TWICE DAILY Patient taking differently: Take 1,000 mg by mouth in the morning and at bedtime. 05/13/20  Yes Truitt Merle, MD  amLODipine (NORVASC) 10 MG tablet TAKE 1 TABLET(10 MG) BY MOUTH DAILY Patient taking differently: Take 10 mg by mouth daily. 08/16/20  Yes Alla Feeling, NP  atorvastatin (LIPITOR) 80 MG tablet TAKE 1 TABLET(80 MG) BY MOUTH DAILY Patient taking differently: Take 80 mg by mouth daily. TAKE 1 TABLET(80 MG) BY MOUTH DAILY 12/16/19  Yes Truitt Merle, MD  diphenoxylate-atropine (LOMOTIL) 2.5-0.025 MG tablet 1 to 2 PO QID prn diarrhea Patient taking differently: Take 1-2 tablets by mouth 4 (four) times daily as needed for diarrhea or loose stools. 04/15/19  Yes Tanner, Lyndon Code., PA-C  furosemide (LASIX) 40 MG tablet Take 40 mg by mouth  daily.   Yes [provider]  glucose blood (ACCU-CHEK GUIDE) test strip Check blood sugar 2 times per day while on steroids before breakfast and dinner Patient taking differently: 1 each by Other route See admin instructions. Check blood sugar 2 times per day while on steroids before breakfast and dinner 09/27/19  Yes [name], MD  hydrochlorothiazide (HYDRODIURIL) 12.5 MG tablet TAKE 1 TABLET(12.5 MG) BY MOUTH DAILY Patient taking differently: Take 12.5 mg by mouth in the morning and at bedtime. 05/25/20  Yes Cato Mulligan, MD  lidocaine-prilocaine (EMLA) cream Apply 1 application topically as needed. Patient taking differently: Apply 1 application topically as needed. 07/17/18  Yes Truitt Merle, MD  metFORMIN (GLUCOPHAGE) 500 MG tablet TAKE 1 TABLET(500 MG) BY MOUTH TWICE DAILY WITH A MEAL Patient taking differently: Take 500 mg by mouth 2 (two) times daily with a meal. 10/23/20  Yes Sanjuan Dame, MD  metoprolol succinate (TOPROL-XL) 100 MG 24 hr tablet Take 100 mg by mouth daily.   Yes [provider]  pantoprazole (PROTONIX) 40 MG tablet TAKE 1 TABLET(40 MG) BY MOUTH TWICE DAILY Patient taking differently: Take 40 mg by mouth 2 (two) times daily. 08/24/20  Yes [name], MD  Podiatric Products (FLEXITOL HEEL BALM) OINT Apply 1 application topically as needed (dry skin).  06/10/19  Yes [provider]  potassium chloride (KLOR-CON) 20 MEQ packet Take 20 mEq by mouth daily. 05/23/20  Yes  Truitt Merle, MD  prochlorperazine (COMPAZINE) 10 MG tablet Take 1 tablet (10 mg total) by mouth every 6 (six) hours as needed for nausea or vomiting. 10/14/18  Yes Alla Feeling, NP  sertraline (ZOLOFT) 25 MG tablet Take 1 tablet (25 mg total) by mouth daily. 10/23/20  Yes Truitt Merle, MD  terbinafine (LAMISIL AT) 1 % cream Apply 1 application topically 2 (two) times daily. Both bottom and top of both feet and toes 06/16/20  Yes Seawell, Jaimie A, DO  traMADol (ULTRAM) 50 MG  tablet Take 1 tablet (50 mg total) by mouth daily as needed for severe pain. 09/27/20  Yes Alla Feeling, NP  traZODone (DESYREL) 100 MG tablet TAKE 1 TABLET(100 MG) BY MOUTH AT BEDTIME Patient taking differently: Take 100 mg by mouth at bedtime. 09/14/20  Yes Cato Mulligan, MD  cetirizine (ZYRTEC) 10 MG tablet TAKE 1 TABLET(10 MG) BY MOUTH DAILY Patient not taking: Reported on 11/01/2020 10/20/20   Axel Filler, MD  ciprofloxacin (CIPRO) 500 MG tablet Take 1 tablet (500 mg total) by mouth 2 (two) times daily. Patient not taking: Reported on 11/01/2020 09/27/20   Alla Feeling, NP  ondansetron (ZOFRAN ODT) 8 MG disintegrating tablet 8mg  ODT q4 hours prn nausea Patient not taking: Reported on 11/01/2020 09/14/20   Mesner, Corene Cornea, MD    Scheduled Meds: Continuous Infusions: PRN Meds:.acetaminophen **OR** acetaminophen, senna-docusate  Allergies as of 11/01/2020 - Review Complete 11/01/2020  Allergen Reaction Noted   Feraheme [ferumoxytol] Other (See Comments) 10/09/2017   Nsaids Other (See Comments) 04/25/2014   Tomato Hives 01/01/2012   Iron (ferrous sulfate) [ferrous sulfate er]  03/07/2020   Other Other (See Comments) 03/07/2020   Wasp venom Swelling 10/06/2017    Family History  Problem Relation Age of Onset   Cancer Mother        Ovarian   Ovarian cancer Mother    Diabetes Mother    Kidney disease Mother    Bleeding Disorder Mother    Diabetes type II Sister    Bleeding Disorder Sister    Diabetes Sister    Colon cancer Maternal Grandfather    Crohn's disease Maternal Grandfather    Stomach cancer Maternal Grandmother    Kidney disease Son    Bleeding Disorder Son    Dysmenorrhea Neg Hx     Social History   Socioeconomic History   Marital status: Single    Spouse name: Not on file   Number of children: Not on file   Years of education: Not on file   Highest education level: Not on file  Occupational History   Not on file  Tobacco Use   Smoking status:  Every Day    Packs/day: 0.50    Years: 30.00    Pack years: 15.00    Types: Cigarettes   Smokeless tobacco: Former    Types: Snuff    Quit date: 1981   Tobacco comments:    1/2 PPD  Vaping Use   Vaping Use: Never used  Substance and Sexual Activity   Alcohol use: No    Alcohol/week: 0.0 standard drinks   Drug use: No    Comment: last cocaine-2010   Sexual activity: Yes    Birth control/protection: Surgical    Comment: Hysterectomy  Other Topics Concern   Not on file  Social History Narrative   Not on file   Social Determinants of Health   Financial Resource Strain: Not on file  Food Insecurity: Not on file  Transportation Needs: Not on file  Physical Activity: Not on file  Stress: Not on file  Social Connections: Not on file  Intimate Partner Violence: Not on file    Review of Systems: Review of Systems  Constitutional:  Negative for chills, fever and weight loss.  HENT:  Negative for hearing loss and tinnitus.   Eyes:  Negative for pain and redness.  Respiratory:  Negative for cough and shortness of breath.   Cardiovascular:  Negative for chest pain and palpitations.  Gastrointestinal:  Positive for blood in stool and nausea. Negative for abdominal pain, constipation, diarrhea, heartburn, melena and vomiting.  Genitourinary:  Negative for flank pain and hematuria.  Musculoskeletal:  Positive for back pain and falls.  Skin:  Negative for itching and rash.  Neurological:  Positive for loss of consciousness and weakness.  Psychiatric/Behavioral:  Negative for substance abuse. The patient is not nervous/anxious.     Physical Exam: Vital signs: Vitals:   11/01/20 1000 11/01/20 1015  BP: 124/61 138/71  Pulse: (!) 59 (!) 58  Resp: 12 13  Temp:    SpO2: 98% 98%      Physical Exam Vitals reviewed.  Constitutional:      General: She is not in acute distress.    Appearance: She is obese.  HENT:     Head: Normocephalic and atraumatic.     Nose: Nose normal.  No congestion.     Mouth/Throat:     Mouth: Mucous membranes are moist.     Pharynx: Oropharynx is clear.  Eyes:     Extraocular Movements: Extraocular movements intact.     Comments: Conjunctival pallor   Cardiovascular:     Rate and Rhythm: Normal rate and regular rhythm.  Pulmonary:     Effort: Pulmonary effort is normal. No respiratory distress.  Abdominal:     General: Bowel sounds are normal. There is no distension.     Palpations: Abdomen is soft. There is no mass.     Tenderness: There is abdominal tenderness (mild, diffuse). There is no guarding or rebound.     Hernia: No hernia is present.  Musculoskeletal:        General: No swelling or tenderness.     Cervical back: Normal range of motion and neck supple.  Skin:    General: Skin is warm and dry.  Neurological:     General: No focal deficit present.     Mental Status: She is oriented to person, place, and time. She is lethargic.  Psychiatric:        Mood and Affect: Mood normal.        Behavior: Behavior normal. Behavior is cooperative.     GI:  Lab Results: Recent Labs    11/01/20 0622  WBC 14.0*  HGB 6.0*  HCT 21.0*  PLT 252   BMET Recent Labs    11/01/20 0622  NA 138  K 3.0*  CL 106  CO2 25  GLUCOSE 140*  BUN 9  CREATININE 1.02*  CALCIUM 8.1*   LFT Recent Labs    11/01/20 0622  PROT 5.2*  ALBUMIN 2.4*  AST 16  ALT 10  ALKPHOS 48  BILITOT 0.5   PT/INR Recent Labs    11/01/20 0622  LABPROT 15.7*  INR 1.3*     Studies/Results: CT Angio Abd/Pel W and/or Wo Contrast  Result Date: 11/01/2020 CLINICAL DATA:  Abdominal pain, nausea, vomiting, rectal bleeding EXAM: CTA ABDOMEN AND PELVIS WITHOUT AND WITH CONTRAST TECHNIQUE: Multidetector CT imaging of  the abdomen and pelvis was performed using the standard protocol during bolus administration of intravenous contrast. Multiplanar reconstructed images and MIPs were obtained and reviewed to evaluate the vascular anatomy. CONTRAST:  18mL  OMNIPAQUE IOHEXOL 350 MG/ML SOLN COMPARISON:  CT 09/14/2020 FINDINGS: VASCULAR Aorta: Mild calcified plaque.  No aneurysm, dissection, or stenosis. Celiac: Patent without evidence of aneurysm, dissection, vasculitis or significant stenosis. SMA: Replaced right arterial supply, an anatomic variant. No aneurysm or stenosis. Renals: Single left, with calcified ostial plaque resulting in short segment stenosis of at least mild severity. Single right, widely patent. IMA: Patent without evidence of aneurysm, dissection, vasculitis or significant stenosis. Inflow: Calcified nonocclusive plaque through bilateral common iliac arteries and in the proximal internal iliac arteries. The external iliac arteries are unremarkable. Proximal Outflow: Mildly atheromatous, patent Veins: Patent patent veins, portal vein, SM V, splenic vein, bilateral renal veins, IVC, iliac venous system. No venous pathology identified. Review of the MIP images confirms the above findings. NON-VASCULAR Lower chest: Linear scarring or subsegmental atelectasis posteriorly at the lung bases. No pleural or pericardial effusion. Hepatobiliary: 7 mm calcification near the gallbladder neck. The gallbladder is nondistended. No focal liver lesion or biliary ductal dilatation. Pancreas: Unremarkable. No pancreatic ductal dilatation or surrounding inflammatory changes. Spleen: Normal in size without focal abnormality. Adrenals/Urinary Tract: Adrenal glands unremarkable. 1.7 cm probable cyst, right upper pole kidney. No hydronephrosis. Urinary bladder incompletely distended. Stomach/Bowel: Stomach is distended by ingested material. Surgical clips along the lesser and greater curvature of the stomach. Hyperdense regions along the posterior wall in the gastric body on the arterial phase scan, without definite accumulation or persistence on the delayed scans to suggest active bleeding. The small bowel is decompressed. Normal appendix. The colon is nondilated,  unremarkable. No definite active GI bleed. Lymphatic: No abdominal or pelvic adenopathy. Reproductive: Status post hysterectomy. No adnexal masses. Other: No ascites.  No free air. Musculoskeletal: Coarse calcifications in the anterior pelvic body wall presumably postoperative. Small paraumbilical hernia containing only mesenteric fat. No fracture or worrisome bone lesion. IMPRESSION: 1. Enhancement in the posterior mucosa of the stomach without convincing CT evidence of active hemorrhage. Correlate with endoscopic findings. 2. No evidence of acute GI bleed. 3. Aortoiliac  atherosclerosis (ICD10-170.0). Electronically Signed   By: Lucrezia Europe M.D.   On: 11/01/2020 09:18    Impression: Hematochezia, history of colonic AVMs -Hgb 6.0 -CT-A today did not show evidence of acute GI bleeding  Melena x2 days last week, resolved, history of gastric AVMs and dieulafoy lesion  -Enhancement in the posterior mucosa of the stomach without convincing CT evidence of active hemorrhage. Correlate with endoscopic findings.  Hereditary hemorrhagic telangiectasia, followed by Dr. Burr Medico  Plan: EGD and colonoscopy Friday 7/22 (no availability in endoscopy unit tomorrow). Plan for bowel prep tomorrow.  Patient currently stable with regular HR and BP.  Clear liquid diet.  Continue supportive care.  Transfuse as needed to maintain Hgb >7.  Protonix 40 mg IV BID.  [facility] will follow.    LOS: 0 days   Salley Slaughter  PA-C 11/01/2020, 12:42 PM  Contact #  [phone]

## 2020-11-01 NOTE — H&P (Signed)
Date: 11/01/2020               Peggy House Name:  Peggy House MRN: [id number]  DOB: 16-Nov-1960 Age / Sex: 60 y.o., female   PCP: [name], MD         Medical Service: Internal Medicine Teaching Service         Attending Physician: Dr. Sid Falcon, MD    First Contact: Dr. Lorin Glass Pager: [phone]  Second Contact: Dr. Dereck Leep Pager: [phone]       After Hours (After 5p/  First Contact Pager: [phone]  weekends / holidays): Second Contact Pager: [phone]   Chief Complaint: Hematochezia  History of Present Illness:  Peggy House is a 60 year old female with a past medical history of hypertension, hyperlipidemia, hereditary hemorrhagic telangiectasia, diabetes who presents to [facility] ED with complaints of recurrent nosebleeds, rectal bleeding, and syncope.  Peggy House started approximately last Wednesday when Peggy House was having recurrent nosebleeds which subsequently subsided Thursday after getting her iron injections. Peggy House went to the beach on Friday through Sunday and had recurrent episodes of epistaxis during her beach trip which required her to stay inside.  Peggy House states that her House resolved on Monday, but reappeared Tuesday evening.  Peggy House states that around 2:00 in the morning Peggy House was unable to fall asleep due to her nosebleeding.  Peggy House states that Peggy House went to the bathroom but felt weak and collapsed and landed on the floor.  Peggy House did not lose consciousness.  Peggy House states that Peggy House felt something "quick and warm" treated from her rectum, and it appeared too quick to be a normal bowel movement.  Peggy House was found by her son who states that Peggy House had a bright red bowel movement.  Peggy House's son called EMS and Peggy House was subsequently brought to the hospital.  Peggy House endorses mild nausea which is resolving, but no episodes of emesis.  Peggy House denies fevers, dyspnea,Chest pain, abdominal pain, or another bout of hematochezia.   Meds:  Current Meds  Medication Sig    Accu-Chek Softclix Lancets lancets Use to check blood sugar before breakfast and before dinner while on steroids (Peggy House taking differently: 1 each by Other route See admin instructions. Use to check blood sugar before breakfast and before dinner while on steroids)   acitretin (SORIATANE) 10 MG capsule Take 10 mg by mouth daily.   albuterol (VENTOLIN HFA) 108 (90 Base) MCG/ACT inhaler Inhale 1-2 puffs into the lungs every 6 (six) hours as needed for wheezing or shortness of breath.   ALPRAZolam (XANAX) 0.5 MG tablet TAKE 1 TABLET(0.5 MG) BY MOUTH AT BEDTIME AS NEEDED FOR ANXIETY (Peggy House taking differently: Take 0.5 mg by mouth at bedtime as needed for anxiety.)   Aminocaproic Acid 1000 MG TABS Take 1 tablet (1,000 mg total) by mouth in the morning and at bedtime. TAKE 1 TABLET(1000 MG) BY MOUTH TWICE DAILY (Peggy House taking differently: Take 1,000 mg by mouth in the morning and at bedtime.)   amLODipine (NORVASC) 10 MG tablet TAKE 1 TABLET(10 MG) BY MOUTH DAILY (Peggy House taking differently: Take 10 mg by mouth daily.)   atorvastatin (LIPITOR) 80 MG tablet TAKE 1 TABLET(80 MG) BY MOUTH DAILY (Peggy House taking differently: Take 80 mg by mouth daily. TAKE 1 TABLET(80 MG) BY MOUTH DAILY)   diphenoxylate-atropine (LOMOTIL) 2.5-0.025 MG tablet 1 to 2 PO QID prn diarrhea (Peggy House taking differently: Take 1-2 tablets by mouth 4 (four) times daily as needed for diarrhea or loose stools.)   furosemide (  LASIX) 40 MG tablet Take 40 mg by mouth daily.   glucose blood (ACCU-CHEK GUIDE) test strip Check blood sugar 2 times per day while on steroids before breakfast and dinner (Peggy House taking differently: 1 each by Other route See admin instructions. Check blood sugar 2 times per day while on steroids before breakfast and dinner)   hydrochlorothiazide (HYDRODIURIL) 12.5 MG tablet TAKE 1 TABLET(12.5 MG) BY MOUTH DAILY (Peggy House taking differently: Take 12.5 mg by mouth in the morning and at bedtime.)   lidocaine-prilocaine  (EMLA) cream Apply 1 application topically as needed. (Peggy House taking differently: Apply 1 application topically as needed.)   metFORMIN (GLUCOPHAGE) 500 MG tablet TAKE 1 TABLET(500 MG) BY MOUTH TWICE DAILY WITH A MEAL (Peggy House taking differently: Take 500 mg by mouth 2 (two) times daily with a meal.)   metoprolol succinate (TOPROL-XL) 100 MG 24 hr tablet Take 100 mg by mouth daily.   pantoprazole (PROTONIX) 40 MG tablet TAKE 1 TABLET(40 MG) BY MOUTH TWICE DAILY (Peggy House taking differently: Take 40 mg by mouth 2 (two) times daily.)   Podiatric Products (FLEXITOL HEEL BALM) OINT Apply 1 application topically as needed (dry skin).    potassium chloride (KLOR-CON) 20 MEQ packet Take 20 mEq by mouth daily.   prochlorperazine (COMPAZINE) 10 MG tablet Take 1 tablet (10 mg total) by mouth every 6 (six) hours as needed for nausea or vomiting.   sertraline (ZOLOFT) 25 MG tablet Take 1 tablet (25 mg total) by mouth daily.   terbinafine (LAMISIL AT) 1 % cream Apply 1 application topically 2 (two) times daily. Both bottom and top of both feet and toes   traMADol (ULTRAM) 50 MG tablet Take 1 tablet (50 mg total) by mouth daily as needed for severe pain.   traZODone (DESYREL) 100 MG tablet TAKE 1 TABLET(100 MG) BY MOUTH AT BEDTIME (Peggy House taking differently: Take 100 mg by mouth at bedtime.)     Allergies: Allergies as of 11/01/2020 - Review Complete 11/01/2020  Allergen Reaction Noted   Feraheme [ferumoxytol] Other (See Comments) 10/09/2017   Nsaids Other (See Comments) 04/25/2014   Tomato Hives 01/01/2012   Iron (ferrous sulfate) [ferrous sulfate er]  03/07/2020   Other Other (See Comments) 03/07/2020   Wasp venom Swelling 10/06/2017   Past Medical History:  Diagnosis Date   Anxiety    Arthritis    knnes,back   GERD (gastroesophageal reflux disease)    Hereditary hemorrhagic telangiectasia (HCC)    History of swelling of feet    Hyperlipidemia    Hypertension    Major depressive disorder,  recurrent episode ([place]) 06/05/2015   Obesity    Snores    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 Housing: Lives at home with youngest son. Social History   Socioeconomic History   Marital status: Single  Spouse name: Not on file   Number of children: Not on file   Years of education: Not on file   Highest education level: Not on file  Occupational History   Not on file  Tobacco Use   Smoking status: Every Day    Packs/day: 0.50    Years: 30.00    Pack years: 15.00    Types: Cigarettes   Smokeless tobacco: Former    Types: Snuff    Quit date: 60   Tobacco comments:    1/2 PPD  Vaping Use   Vaping Use: Never used  Substance and Sexual Activity   Alcohol use: No    Alcohol/week: 0.0 standard drinks   Drug use: No    Comment: last cocaine-2010   Sexual activity: Yes    Birth control/protection: Surgical    Comment: Hysterectomy  Other Topics Concern   Not on file  Social History Narrative   Not on file   Social Determinants of Health   Financial Resource Strain: Not on file  Food Insecurity: Not on file  Transportation Needs: Not on file  Physical Activity: Not on file  Stress: Not on file  Social Connections: Not on file  Intimate Partner Violence: Not on file      Review of Systems: A complete ROS was negative except as per HPI.   Physical Exam: Blood pressure 137/74, pulse (!) 57, temperature 98.3 F (36.8  C), temperature source Oral, resp. rate 20, SpO2 97 %. Physical Exam Constitutional:      General: Peggy House is not in acute distress.    Appearance: Peggy House is obese. Peggy House is not ill-appearing, toxic-appearing or diaphoretic.  Cardiovascular:     Rate and Rhythm: Regular rhythm. Bradycardia present.     Pulses: Normal pulses.     Heart sounds: Normal heart sounds.    No friction rub. No gallop.     Comments: Grade 2 Systolic murmur appreciated LUSB Pulmonary:     Effort: Pulmonary effort is normal.     Breath sounds: Normal breath sounds. No wheezing, rhonchi or rales.  Chest:     Chest wall: No tenderness.  Abdominal:     General: Bowel sounds are normal.     Palpations: Abdomen is soft.     Tenderness: There is no abdominal tenderness. There is no guarding.  Musculoskeletal:     Right lower leg: No edema.     Left lower leg: No edema.  Skin:    Findings: No bruising, erythema, lesion or rash.  Neurological:     Mental Status: Peggy House is alert and oriented to person, place, and time.  Psychiatric:        Mood and Affect: Mood normal.        Behavior: Behavior normal.     EKG: personally reviewed my interpretation isnormal sinus rhythm with no ST elevations or ischemic changes  CT Angio Abd/Pel W and/or Wo Contrast  Result Date: 11/01/2020 CLINICAL DATA:  Abdominal pain, nausea, vomiting, rectal bleeding EXAM: CTA ABDOMEN AND PELVIS WITHOUT AND WITH CONTRAST TECHNIQUE: Multidetector CT imaging of the abdomen and pelvis was performed using the standard protocol during bolus administration of intravenous contrast. Multiplanar reconstructed images and MIPs were obtained and reviewed to evaluate the vascular anatomy. CONTRAST:  132mL OMNIPAQUE IOHEXOL 350 MG/ML SOLN COMPARISON:  CT 09/14/2020 FINDINGS: VASCULAR Aorta: Mild calcified plaque.  No aneurysm, dissection, or stenosis. Celiac: Patent without evidence of aneurysm, dissection, vasculitis or significant stenosis. SMA: Replaced right  arterial supply, an anatomic  variant. No aneurysm or stenosis. Renals: Single left, with calcified ostial plaque resulting in short segment stenosis of at least mild severity. Single right, widely patent. IMA: Patent without evidence of aneurysm, dissection, vasculitis or significant stenosis. Inflow: Calcified nonocclusive plaque through bilateral common iliac arteries and in the proximal internal iliac arteries. The external iliac arteries are unremarkable. Proximal Outflow: Mildly atheromatous, patent Veins: Patent patent veins, portal vein, SM V, splenic vein, bilateral renal veins, IVC, iliac venous system. No venous pathology identified. Review of the MIP images confirms the above findings. NON-VASCULAR Lower chest: Linear scarring or subsegmental atelectasis posteriorly at the lung bases. No pleural or pericardial effusion. Hepatobiliary: 7 mm calcification near the gallbladder neck. The gallbladder is nondistended. No focal liver lesion or biliary ductal dilatation. Pancreas: Unremarkable. No pancreatic ductal dilatation or surrounding inflammatory changes. Spleen: Normal in size without focal abnormality. Adrenals/Urinary Tract: Adrenal glands unremarkable. 1.7 cm probable cyst, right upper pole kidney. No hydronephrosis. Urinary bladder incompletely distended. Stomach/Bowel: Stomach is distended by ingested material. Surgical clips along the lesser and greater curvature of the stomach. Hyperdense regions along the posterior wall in the gastric body on the arterial phase scan, without definite accumulation or persistence on the delayed scans to suggest active bleeding. The small bowel is decompressed. Normal appendix. The colon is nondilated, unremarkable. No definite active GI bleed. Lymphatic: No abdominal or pelvic adenopathy. Reproductive: Status post hysterectomy. No adnexal masses. Other: No ascites.  No free air. Musculoskeletal: Coarse calcifications in the anterior pelvic body wall presumably  postoperative. Small paraumbilical hernia containing only mesenteric fat. No fracture or worrisome bone lesion. IMPRESSION: 1. Enhancement in the posterior mucosa of the stomach without convincing CT evidence of active hemorrhage. Correlate with endoscopic findings. 2. No evidence of acute GI bleed. 3. Aortoiliac  atherosclerosis (ICD10-170.0). Electronically Signed   By: Lucrezia Europe M.D.   On: 11/01/2020 09:18     Assessment & Plan by Problem: Active Problems:   Syncope   Syncope in the Setting of Blood Loss Likely 2/2 to AVMs/HHT: Peggy House with history of recurrent GI bleeds presents with several days of epistaxis and 1 large episode of hematochezia earlier this morning.  Hemoglobin of 6.0 currently being given PRBCs. Peggy House is hemodynamically stable.  Peggy House has had no more episodes of hematochezia since coming to the hospital.  GI was consulted in the ED. Recommending endoscopy on the 11/03/2020.  Peggy House follows with Dr. Burr Medico for her HHT, and receives IV iron, and blood transfusion as needed, Peggy House typically takes 2 blood transfusions a month. - Appreciate GIs recommendations. - Follow-up posttransfusion H&H continue to transfuse as needed to maintain hemoglobin greater than 7 - Clear liquid diet - Colonoscopy on 11/03/2020 - Protonix 40 mg IV twice daily - Continue telemetry  Depression Anxiety: Peggy House with a history of depression currently treated with sertraline 125 mg daily, trazodone 100 mg nightly, and alprazolam 0.5 mg nightly as needed presents to the ED with hematochezia.  Peggy House lost husband a year ago due to motor vehicle crash, with an upcoming court date next month. Peggy House also states that her anxiety and depression are aggravated when Peggy House is admitted to go because Peggy House has had family members passed at this hospital.  We will continue her medications. - Trazodone to 100 mg nightly - Sertraline 125 mg daily  Hypertension: Peggy House on hydrochlorothiazide 12.5 mg daily and amlodipine 10 mg  daily blood pressures remain in the 120s 130s at this time in the setting of GI blood loss we  will hold home medications.  Hyperlipidemia Peggy House with history of hyperlipidemia takes Lipitor 80 mg daily we will continue during her hospitalization. - Lipitor 80 mg daily  Type 2 Diabetes Mellitus: Last A1c was 8 months ago well-controlled at 5.2 currently taking metformin 500 mg twice daily.  We will start basal dose with sliding scale during hospital stay.  Currently on clear liquid diet will start SSI. - Sliding scale insulin   Dispo: Admit Peggy House to Inpatient with expected length of stay greater than 2 midnights.  Signed: Maudie Mercury, MD 11/01/2020, 4:23 PM  Pager: [phone] After 5pm on weekdays and 1pm on weekends: On Call pager: [phone]

## 2020-11-02 ENCOUNTER — Inpatient Hospital Stay (HOSPITAL_COMMUNITY): Payer: Medicaid Other

## 2020-11-02 ENCOUNTER — Inpatient Hospital Stay: Payer: Medicaid Other

## 2020-11-02 ENCOUNTER — Inpatient Hospital Stay: Payer: Medicaid Other | Admitting: Hematology

## 2020-11-02 ENCOUNTER — Other Ambulatory Visit: Payer: Self-pay | Admitting: Hematology

## 2020-11-02 DIAGNOSIS — K922 Gastrointestinal hemorrhage, unspecified: Secondary | ICD-10-CM | POA: Diagnosis not present

## 2020-11-02 DIAGNOSIS — D500 Iron deficiency anemia secondary to blood loss (chronic): Secondary | ICD-10-CM | POA: Diagnosis not present

## 2020-11-02 DIAGNOSIS — D649 Anemia, unspecified: Secondary | ICD-10-CM | POA: Diagnosis not present

## 2020-11-02 DIAGNOSIS — I780 Hereditary hemorrhagic telangiectasia: Secondary | ICD-10-CM

## 2020-11-02 DIAGNOSIS — M545 Low back pain, unspecified: Secondary | ICD-10-CM | POA: Diagnosis not present

## 2020-11-02 DIAGNOSIS — D62 Acute posthemorrhagic anemia: Secondary | ICD-10-CM | POA: Diagnosis not present

## 2020-11-02 DIAGNOSIS — K625 Hemorrhage of anus and rectum: Secondary | ICD-10-CM | POA: Diagnosis not present

## 2020-11-02 LAB — CBG MONITORING, ED
Glucose-Capillary: 81 mg/dL (ref 70–99)
Glucose-Capillary: 82 mg/dL (ref 70–99)
Glucose-Capillary: 87 mg/dL (ref 70–99)

## 2020-11-02 LAB — GLUCOSE, CAPILLARY: Glucose-Capillary: 86 mg/dL (ref 70–99)

## 2020-11-02 LAB — CBC
HCT: 22.8 % — ABNORMAL LOW (ref 36.0–46.0)
HCT: 25.2 % — ABNORMAL LOW (ref 36.0–46.0)
Hemoglobin: 6.6 g/dL — CL (ref 12.0–15.0)
Hemoglobin: 7.5 g/dL — ABNORMAL LOW (ref 12.0–15.0)
MCH: 25.7 pg — ABNORMAL LOW (ref 26.0–34.0)
MCH: 26.1 pg (ref 26.0–34.0)
MCHC: 28.9 g/dL — ABNORMAL LOW (ref 30.0–36.0)
MCHC: 29.8 g/dL — ABNORMAL LOW (ref 30.0–36.0)
MCV: 88.7 fL (ref 80.0–100.0)
MCV: 87.8 fL (ref 80.0–100.0)
Platelets: 261 10*3/uL (ref 150–400)
Platelets: 266 10*3/uL (ref 150–400)
RBC: 2.57 MIL/uL — ABNORMAL LOW (ref 3.87–5.11)
RBC: 2.87 MIL/uL — ABNORMAL LOW (ref 3.87–5.11)
RDW: 21.2 % — ABNORMAL HIGH (ref 11.5–15.5)
RDW: 21 % — ABNORMAL HIGH (ref 11.5–15.5)
WBC: 14.2 10*3/uL — ABNORMAL HIGH (ref 4.0–10.5)
WBC: 13.2 10*3/uL — ABNORMAL HIGH (ref 4.0–10.5)
nRBC: 0.1 % (ref 0.0–0.2)
nRBC: 0 % (ref 0.0–0.2)

## 2020-11-02 LAB — BASIC METABOLIC PANEL
Anion gap: 7 (ref 5–15)
BUN: 23 mg/dL — ABNORMAL HIGH (ref 6–20)
CO2: 26 mmol/L (ref 22–32)
Calcium: 8.7 mg/dL — ABNORMAL LOW (ref 8.9–10.3)
Chloride: 108 mmol/L (ref 98–111)
Creatinine, Ser: 0.74 mg/dL (ref 0.44–1.00)
GFR, Estimated: >60 mL/min (ref >60)
Glucose, Bld: 91 mg/dL (ref 70–99)
Potassium: 3.9 mmol/L (ref 3.5–5.1)
Sodium: 141 mmol/L (ref 135–145)

## 2020-11-02 LAB — IRON AND TIBC
Iron: 16 ug/dL — ABNORMAL LOW (ref 28–170)
Saturation Ratios: 8 % — ABNORMAL LOW (ref 10.4–31.8)
TIBC: 199 ug/dL — ABNORMAL LOW (ref 250–450)
UIBC: 183 ug/dL

## 2020-11-02 LAB — FERRITIN: Ferritin: 135 ng/mL (ref 11–307)

## 2020-11-02 LAB — PREPARE RBC (CROSSMATCH)

## 2020-11-02 MED ORDER — SODIUM CHLORIDE 0.9 % IV SOLN: INTRAVENOUS | Status: DC

## 2020-11-02 MED ORDER — SODIUM CHLORIDE 0.9 % IV SOLN
250.0000 mg | Freq: Once | INTRAVENOUS | Status: AC
  Administered 2020-11-02: 250 mg via INTRAVENOUS
  Filled 2020-11-02: qty 20

## 2020-11-02 MED ORDER — HYDROMORPHONE HCL 1 MG/ML IJ SOLN
1.0000 mg | INTRAMUSCULAR | Status: DC | PRN
  Administered 2020-11-02 – 2020-11-03 (×8): 1 mg via INTRAVENOUS
  Filled 2020-11-02 (×8): qty 1

## 2020-11-02 MED ORDER — CHLORHEXIDINE GLUCONATE CLOTH 2 % EX PADS
6.0000 | MEDICATED_PAD | Freq: Every day | CUTANEOUS | Status: DC
  Administered 2020-11-02 – 2020-11-05 (×4): 6 via TOPICAL

## 2020-11-02 MED ORDER — SODIUM CHLORIDE 0.9% IV SOLUTION: Freq: Once | INTRAVENOUS | Status: DC

## 2020-11-02 NOTE — ED Notes (Signed)
Hgb 6.6  Called to Dr. Court Joy.

## 2020-11-02 NOTE — ED Notes (Signed)
Patient placed in hospital bed

## 2020-11-02 NOTE — ED Notes (Signed)
HOOKED PATIENT BACK UP TO THE MONITOR PATIENT IS RESTING WITH CALL BELL IN REACH

## 2020-11-02 NOTE — Progress Notes (Signed)
Subjective: The patient is resting uncomfortably in bed. She endorses severe lower back pain today. She has a history of chronic back pain, but she thinks her back pain now is from her recent fall. She denies any other episodes of epistaxis or hematochezia since she was admitted. No other complaints or concerns today.   Objective:  Vital signs in last 24 hours: Vitals:   11/02/20 0623 11/02/20 0630 11/02/20 0645 11/02/20 0801  BP: (!) 153/75 (!) 153/74 (!) 157/68 (!) 153/75  Pulse: 60 (!) 59 (!) 59 60  Resp: 20 (!) 24 19 20   Temp: 98 F (36.7 C)   98 F (36.7 C)  TempSrc: Temporal   Oral  SpO2:  97% 97% 97%    General: Normal appearance, laying in bed on her side and in distress HE: Normocephalic, atraumatic , EOMI, Conjunctivae normal ENT: No congestion, no rhinorrhea, no exudate or erythema, there is one telangiectasia inside her left nostril without signs of active bleeding.  Cardiovascular: Normal rate, regular rhythm. No murmurs, rubs, or gallops Pulmonary: Effort normal, breath sounds normal. No wheezes, rales, or rhonchi Abdominal: soft, nontender, bowel sounds present Musculoskeletal: no swelling , deformity, injury, or tenderness in extremities, Skin: Warm, dry, no bruising, erythema, or rash Psychiatric/Behavioral: normal mood, normal behavior    Assessment/Plan:  Active Problems:   Syncope  Syncope in the Setting of Blood Loss Likely 2/2 to AVMs/HHT: Patient with history of recurrent GI bleeds presents with several days of epistaxis and 1 large episode of hematochezia earlier this morning.  Hemoglobin of 6.0 currently being given PRBCs. Patient is hemodynamically stable. She has had no more episodes of hematochezia since coming to the hospital. GI was consulted in the ED. CTA was unremarkable for active bleeding. Recommending endoscopy on the 11/03/2020.  Patient follows with Dr. Burr Medico for her HHT, and receives IV iron, and blood transfusion as needed, she typically  takes 2 blood transfusions a month. Will reach out to Dr. Burr Medico today to see if they have anymore recommendations moving forward. - Appreciate GIs recommendations. - Clear liquid diet - Plan for EGD and colonoscopy tomorrow, 11/03/2020 - Follow-up posttransfusion H&H continue to transfuse as needed to maintain hemoglobin greater than 7 - Protonix 40 mg IV twice daily - Continue telemetry  Back pain, acute on chronic Ms. Isley-Wansley endorses severe lower back pain, acute on chronic, from her fall prior to admission. We obtained lumbar spine films today, but these were unremarkable for an acute abnormality.  - Dilaudid 1 mg q2h PRN - Tylenol 605 mg q6h PRN - Methocarbamol 500 mg q8h PRN   Depression Anxiety: Patient with a history of depression and anxiety, currently treated with sertraline 125 mg daily, trazodone 100 mg nightly, and alprazolam 0.5 mg nightly as needed. We will continue her medications. - Trazodone to 100 mg nightly - Sertraline 125 mg daily  Hypertension: Patient on hydrochlorothiazide 12.5 mg daily and amlodipine 10 mg daily blood pressures remain stable at this time in the setting of GI blood loss, so we will hold her home medications.  Hyperlipidemia Patient with history of hyperlipidemia takes Lipitor 80 mg daily we will continue during her hospitalization. - Lipitor 80 mg daily   Type 2 Diabetes Mellitus: A1c well-controlled at 5.0, taking metformin 500 mg twice daily at home. We will continue SSI - Sliding scale insulin   Prior to Admission Living Arrangement: Home Anticipated Discharge Location: Home Barriers to Discharge: Management of hemodynamic status and HHT Dispo: Anticipated discharge in approximately  1-2 day(s).   Orvis Brill, MD 11/02/2020, 8:58 AM Pager: [phone]  After 5pm on weekdays and 1pm on weekends: On Call pager [phone]

## 2020-11-02 NOTE — Progress Notes (Signed)
[facility] Gastroenterology Progress Note  Peggy House 60 y.o. 06-28-1960  CC: Rectal bleeding   Subjective: Patient seen and examined at bedside.  Denies abdominal pain, nausea and vomiting.  ROS : Complaining of weakness.  Denies active chest pain.   Objective: Vital signs in last 24 hours: Vitals:   11/02/20 0901 11/02/20 0920  BP: (!) 164/75 138/67  Pulse: 63 64  Resp: 14 18  Temp: 99.1 F (37.3 C)   SpO2: 100% 99%    Physical Exam:  General : Resting comfortably.  Not in acute distress Heart  : Rate rhythm regular Abdomen : Soft, nontender, nondistended, bowel sounds present Psych : Mood and affect normal   Lab Results: Recent Labs    11/01/20 0622 11/02/20 0438  NA 138 141  K 3.0* 3.9  CL 106 108  CO2 25 26  GLUCOSE 140* 91  BUN 9 23*  CREATININE 1.02* 0.74  CALCIUM 8.1* 8.7*   Recent Labs    11/01/20 0622  AST 16  ALT 10  ALKPHOS 48  BILITOT 0.5  PROT 5.2*  ALBUMIN 2.4*   Recent Labs    11/01/20 0622 11/01/20 1627 11/01/20 1844 11/02/20 0438  WBC 14.0*  --  13.8* 14.2*  NEUTROABS 11.3*  --  11.8*  --   HGB 6.0*   < > 7.3* 6.6*  HCT 21.0*   < > 25.1* 22.8*  MCV 92.1  --  89.6 88.7  PLT 252  --  255 261   < > = values in this interval not displayed.   Recent Labs    11/01/20 0622  LABPROT 15.7*  INR 1.3*      Assessment/Plan: -Rectal bleeding and melena in a patient with history of colonic AVMs and history of hereditary hemorrhagic telangiectasia. -Abnormal CT scan concerning for enhancement in the posterior mucosa of the stomach. -Acute blood loss anemia  Recommendations ----------------------- -Plan for EGD and colonoscopy tomorrow. -Transfuse to keep hemoglobin more than 7 -Continue Protonix -Keep n.p.o. past midnight -GI will follow   Otis Brace MD, FACP 11/02/2020, 11:29 AM  Contact #  [phone]

## 2020-11-02 NOTE — Progress Notes (Addendum)
HEMATOLOGY-ONCOLOGY PROGRESS NOTE  SUBJECTIVE: Peggy House is followed by our office for HHT and anemia.  She receives ferric gluconate every 1 to 2 weeks and blood transfusions as needed.  She also receives bevacizumab.  She was due for treatment in our office today.  However, it now admitted due to symptomatic anemia following a syncopal episode.  She reports hematochezia.  She was seen by GI and will undergo EGD and colonoscopy tomorrow.  I saw the patient in the emergency department today.  Her only complaint is of back pain.  She denies abdominal pain, nausea, vomiting.  Has not moved her bowels since being at the hospital so she is unsure if she is still having hematochezia.   REVIEW OF SYSTEMS:   Constitutional: Denies fevers, chills Eyes: Denies blurriness of vision Ears, nose, mouth, throat, and face: Denies mucositis or sore throat Respiratory: Denies cough, dyspnea or wheezes Cardiovascular: Denies palpitation, chest discomfort Gastrointestinal: Denies abdominal pain, nausea, vomiting.  Reports hematochezia prior to admission. Skin: Denies abnormal skin rashes Lymphatics: Denies new lymphadenopathy or easy bruising Neurological:Denies numbness, tingling or new weaknesses Behavioral/Psych: Mood is stable, no new changes  Extremities: No lower extremity edema All other systems were reviewed with the patient and are negative.  I have reviewed the past medical history, past surgical history, social history and family history with the patient and they are unchanged from previous note.   PHYSICAL EXAMINATION: ECOG PERFORMANCE STATUS: 3 - Symptomatic, >50% confined to bed  Vitals:   11/02/20 1320 11/02/20 1414  BP:  (!) 167/96  Pulse:  69  Resp:  (!) 22  Temp: 98.9 F (37.2 C)   SpO2:  97%   There were no vitals filed for this visit.  Intake/Output from previous day: No intake/output data recorded.  GENERAL:alert, no distress and comfortable SKIN: skin color,  texture, turgor are normal, no rashes or significant lesions EYES: normal, Conjunctiva are pink and non-injected, sclera clear OROPHARYNX:no exudate, no erythema and lips, buccal mucosa, and tongue normal  LUNGS: clear to auscultation and percussion with normal breathing effort HEART: regular rate & rhythm and no murmurs and no lower extremity edema ABDOMEN:abdomen soft, non-tender and normal bowel sounds NEURO: alert & oriented x 3 with fluent speech, no focal motor/sensory deficits  LABORATORY DATA:  I have reviewed the data as listed CMP Latest Ref Rng & Units 11/02/2020 11/01/2020 10/12/2020  Glucose 70 - 99 mg/dL 91 140(H) 100(H)  BUN 6 - 20 mg/dL 23(H) 9 9  Creatinine 0.44 - 1.00 mg/dL 0.74 1.02(H) 0.86  Sodium 135 - 145 mmol/L 141 138 142  Potassium 3.5 - 5.1 mmol/L 3.9 3.0(L) 3.6  Chloride 98 - 111 mmol/L 108 106 110  CO2 22 - 32 mmol/L 26 25 25   Calcium 8.9 - 10.3 mg/dL 8.7(L) 8.1(L) 8.7(L)  Total Protein 6.5 - 8.1 g/dL - 5.2(L) 6.4(L)  Total Bilirubin 0.3 - 1.2 mg/dL - 0.5 <0.2(L)  Alkaline Phos 38 - 126 U/L - 48 66  AST 15 - 41 U/L - 16 12(L)  ALT 0 - 44 U/L - 10 <6    Lab Results  Component Value Date   WBC 13.2 (H) 11/02/2020   HGB 7.5 (L) 11/02/2020   HCT 25.2 (L) 11/02/2020   MCV 87.8 11/02/2020   PLT 266 11/02/2020   NEUTROABS 11.8 (H) 11/01/2020    DG Lumbar Spine 2-3 Views  Result Date: 11/02/2020 CLINICAL DATA:  Recent fall with low back pain, initial encounter EXAM: LUMBAR SPINE - 3  VIEW COMPARISON:  11/08/2010 FINDINGS: Five lumbar type vertebral bodies are well visualized. Vertebral body height is well maintained. No anterolisthesis is noted. Mild disc space narrowing is noted at L4-5. Mild osteophytic changes are seen. Aortic calcifications are noted without aneurysmal dilatation. Coils are noted in the left upper quadrant. IMPRESSION: Mild degenerative change without acute abnormality. Electronically Signed   By: Inez Catalina M.D.   On: 11/02/2020 09:34    CT Angio Abd/Pel W and/or Wo Contrast  Result Date: 11/01/2020 CLINICAL DATA:  Abdominal pain, nausea, vomiting, rectal bleeding EXAM: CTA ABDOMEN AND PELVIS WITHOUT AND WITH CONTRAST TECHNIQUE: Multidetector CT imaging of the abdomen and pelvis was performed using the standard protocol during bolus administration of intravenous contrast. Multiplanar reconstructed images and MIPs were obtained and reviewed to evaluate the vascular anatomy. CONTRAST:  175mL OMNIPAQUE IOHEXOL 350 MG/ML SOLN COMPARISON:  CT 09/14/2020 FINDINGS: VASCULAR Aorta: Mild calcified plaque.  No aneurysm, dissection, or stenosis. Celiac: Patent without evidence of aneurysm, dissection, vasculitis or significant stenosis. SMA: Replaced right arterial supply, an anatomic variant. No aneurysm or stenosis. Renals: Single left, with calcified ostial plaque resulting in short segment stenosis of at least mild severity. Single right, widely patent. IMA: Patent without evidence of aneurysm, dissection, vasculitis or significant stenosis. Inflow: Calcified nonocclusive plaque through bilateral common iliac arteries and in the proximal internal iliac arteries. The external iliac arteries are unremarkable. Proximal Outflow: Mildly atheromatous, patent Veins: Patent patent veins, portal vein, SM V, splenic vein, bilateral renal veins, IVC, iliac venous system. No venous pathology identified. Review of the MIP images confirms the above findings. NON-VASCULAR Lower chest: Linear scarring or subsegmental atelectasis posteriorly at the lung bases. No pleural or pericardial effusion. Hepatobiliary: 7 mm calcification near the gallbladder neck. The gallbladder is nondistended. No focal liver lesion or biliary ductal dilatation. Pancreas: Unremarkable. No pancreatic ductal dilatation or surrounding inflammatory changes. Spleen: Normal in size without focal abnormality. Adrenals/Urinary Tract: Adrenal glands unremarkable. 1.7 cm probable cyst, right upper  pole kidney. No hydronephrosis. Urinary bladder incompletely distended. Stomach/Bowel: Stomach is distended by ingested material. Surgical clips along the lesser and greater curvature of the stomach. Hyperdense regions along the posterior wall in the gastric body on the arterial phase scan, without definite accumulation or persistence on the delayed scans to suggest active bleeding. The small bowel is decompressed. Normal appendix. The colon is nondilated, unremarkable. No definite active GI bleed. Lymphatic: No abdominal or pelvic adenopathy. Reproductive: Status post hysterectomy. No adnexal masses. Other: No ascites.  No free air. Musculoskeletal: Coarse calcifications in the anterior pelvic body wall presumably postoperative. Small paraumbilical hernia containing only mesenteric fat. No fracture or worrisome bone lesion. IMPRESSION: 1. Enhancement in the posterior mucosa of the stomach without convincing CT evidence of active hemorrhage. Correlate with endoscopic findings. 2. No evidence of acute GI bleed. 3. Aortoiliac  atherosclerosis (ICD10-170.0). Electronically Signed   By: Lucrezia Europe M.D.   On: 11/01/2020 09:18    ASSESSMENT AND PLAN: 1.  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  HHT  3.  Syncope in the setting of blood loss anemia 4.  Depression anxiety 5.  Hypertension 6.  Hyperlipidemia 7.  Type II diabetes mellitus  -Labs reviewed and she has received 2 units PRBCs today.  CBC following transfusion up to 7.5.  Her ferritin is 135 today.  We will administer her planned dose of ferrous gluconate 200 mg IV x1.  The patient has prior infusion reaction to [facility] and ferrous  sulfate but tolerates ferrous gluconate well.  Recommend PRBC transfusion to keep hemoglobin above 7.5.  -Will hold on bevacizumab during her hospitalization and resume as an outpatient. -GI following and plans for EGD and colonoscopy tomorrow. -Continue management of chronic medical conditions per primary  team.   LOS: 1 day   Mikey Bussing, DNP, AGPCNP-BC, AOCNP 11/02/20  Attending Addendum  I have seen the patient, examined her. I agree with the assessment and and plan and have edited the notes.   Savon has long standing history of GI bleeding from her HHT, which requires iv iron every 1-2 weeks, Avastin every 2 weeks, and intermittent blood transfusion.  Patient sometimes does miss her appointments, and was out of town for vacation lately.  She presented with severe anemia and syncope episode.  I agree with blood transfusion to keep hemoglobin above 7.5, will restart IV iron with ferric gluconate 250 mg today, and repeat another dose in 2-3 days if she remains hospitalized. I plan to restart bevacizumab in my office after her discharge.  I encouraged her to keep her appointment with [place] weekly. I am not sure why she has such severe back pain, CT scan yesterday was negative for internal hemorrhage.  She has been getting IV Dilaudid as needed. I appreciate the IM residency team's great care.   Truitt Merle  11/02/2020

## 2020-11-02 NOTE — ED Notes (Signed)
Tele  Breakfast Ordered 

## 2020-11-02 NOTE — ED Notes (Signed)
Attempted to call report, secretary advised nurse will call back when available, number was left.

## 2020-11-03 ENCOUNTER — Encounter (HOSPITAL_COMMUNITY): Payer: Self-pay | Admitting: Internal Medicine

## 2020-11-03 ENCOUNTER — Encounter (HOSPITAL_COMMUNITY)
Admission: EM | Disposition: A | Discharge status: Home / Self Care | Payer: Self-pay | Source: Home / Self Care | Attending: Internal Medicine

## 2020-11-03 ENCOUNTER — Inpatient Hospital Stay (HOSPITAL_COMMUNITY): Payer: Medicaid Other | Admitting: Certified Registered Nurse Anesthetist

## 2020-11-03 DIAGNOSIS — K2971 Gastritis, unspecified, with bleeding: Secondary | ICD-10-CM | POA: Diagnosis not present

## 2020-11-03 DIAGNOSIS — F419 Anxiety disorder, unspecified: Secondary | ICD-10-CM

## 2020-11-03 DIAGNOSIS — Z6839 Body mass index (BMI) 39.0-39.9, adult: Secondary | ICD-10-CM | POA: Diagnosis not present

## 2020-11-03 DIAGNOSIS — Z20822 Contact with and (suspected) exposure to covid-19: Secondary | ICD-10-CM | POA: Diagnosis not present

## 2020-11-03 DIAGNOSIS — F32A Depression, unspecified: Secondary | ICD-10-CM | POA: Diagnosis not present

## 2020-11-03 DIAGNOSIS — R55 Syncope and collapse: Secondary | ICD-10-CM

## 2020-11-03 DIAGNOSIS — G8929 Other chronic pain: Secondary | ICD-10-CM | POA: Diagnosis not present

## 2020-11-03 DIAGNOSIS — K254 Chronic or unspecified gastric ulcer with hemorrhage: Secondary | ICD-10-CM | POA: Diagnosis not present

## 2020-11-03 DIAGNOSIS — K31811 Angiodysplasia of stomach and duodenum with bleeding: Secondary | ICD-10-CM | POA: Diagnosis not present

## 2020-11-03 DIAGNOSIS — I2721 Secondary pulmonary arterial hypertension: Secondary | ICD-10-CM | POA: Diagnosis not present

## 2020-11-03 DIAGNOSIS — I10 Essential (primary) hypertension: Secondary | ICD-10-CM

## 2020-11-03 DIAGNOSIS — E669 Obesity, unspecified: Secondary | ICD-10-CM | POA: Diagnosis not present

## 2020-11-03 DIAGNOSIS — E119 Type 2 diabetes mellitus without complications: Secondary | ICD-10-CM

## 2020-11-03 DIAGNOSIS — K449 Diaphragmatic hernia without obstruction or gangrene: Secondary | ICD-10-CM | POA: Diagnosis not present

## 2020-11-03 DIAGNOSIS — D649 Anemia, unspecified: Secondary | ICD-10-CM | POA: Diagnosis not present

## 2020-11-03 DIAGNOSIS — D500 Iron deficiency anemia secondary to blood loss (chronic): Secondary | ICD-10-CM | POA: Diagnosis not present

## 2020-11-03 DIAGNOSIS — K921 Melena: Secondary | ICD-10-CM | POA: Diagnosis not present

## 2020-11-03 DIAGNOSIS — K922 Gastrointestinal hemorrhage, unspecified: Secondary | ICD-10-CM | POA: Diagnosis not present

## 2020-11-03 DIAGNOSIS — K625 Hemorrhage of anus and rectum: Secondary | ICD-10-CM | POA: Diagnosis not present

## 2020-11-03 DIAGNOSIS — I780 Hereditary hemorrhagic telangiectasia: Secondary | ICD-10-CM | POA: Diagnosis not present

## 2020-11-03 DIAGNOSIS — E785 Hyperlipidemia, unspecified: Secondary | ICD-10-CM

## 2020-11-03 DIAGNOSIS — M549 Dorsalgia, unspecified: Secondary | ICD-10-CM | POA: Diagnosis not present

## 2020-11-03 DIAGNOSIS — K219 Gastro-esophageal reflux disease without esophagitis: Secondary | ICD-10-CM | POA: Diagnosis not present

## 2020-11-03 DIAGNOSIS — D62 Acute posthemorrhagic anemia: Secondary | ICD-10-CM | POA: Diagnosis not present

## 2020-11-03 HISTORY — PX: SCLEROTHERAPY: SHX6841

## 2020-11-03 HISTORY — PX: ESOPHAGOGASTRODUODENOSCOPY: SHX5428

## 2020-11-03 LAB — CBC
HCT: 25.9 % — ABNORMAL LOW (ref 36.0–46.0)
Hemoglobin: 7.8 g/dL — ABNORMAL LOW (ref 12.0–15.0)
MCH: 26.3 pg (ref 26.0–34.0)
MCHC: 30.1 g/dL (ref 30.0–36.0)
MCV: 87.2 fL (ref 80.0–100.0)
Platelets: 312 10*3/uL (ref 150–400)
RBC: 2.97 MIL/uL — ABNORMAL LOW (ref 3.87–5.11)
RDW: 21.5 % — ABNORMAL HIGH (ref 11.5–15.5)
WBC: 12.8 10*3/uL — ABNORMAL HIGH (ref 4.0–10.5)
nRBC: 0.2 % (ref 0.0–0.2)

## 2020-11-03 LAB — BASIC METABOLIC PANEL
Anion gap: 11 (ref 5–15)
BUN: 9 mg/dL (ref 6–20)
CO2: 23 mmol/L (ref 22–32)
Calcium: 8.7 mg/dL — ABNORMAL LOW (ref 8.9–10.3)
Chloride: 107 mmol/L (ref 98–111)
Creatinine, Ser: 0.82 mg/dL (ref 0.44–1.00)
GFR, Estimated: >60 mL/min (ref >60)
Glucose, Bld: 133 mg/dL — ABNORMAL HIGH (ref 70–99)
Potassium: 3.4 mmol/L — ABNORMAL LOW (ref 3.5–5.1)
Sodium: 141 mmol/L (ref 135–145)

## 2020-11-03 LAB — BPAM RBC
Blood Product Expiration Date: 202208142359
Blood Product Expiration Date: 202208212359
ISSUE DATE / TIME: 202207200913
ISSUE DATE / TIME: 202207210559
Unit Type and Rh: 600
Unit Type and Rh: 600

## 2020-11-03 LAB — TYPE AND SCREEN
ABO/RH(D): A NEG
Antibody Screen: NEGATIVE
Unit division: 0
Unit division: 0

## 2020-11-03 LAB — GLUCOSE, CAPILLARY
Glucose-Capillary: 100 mg/dL — ABNORMAL HIGH (ref 70–99)
Glucose-Capillary: 133 mg/dL — ABNORMAL HIGH (ref 70–99)
Glucose-Capillary: 102 mg/dL — ABNORMAL HIGH (ref 70–99)
Glucose-Capillary: 118 mg/dL — ABNORMAL HIGH (ref 70–99)

## 2020-11-03 SURGERY — EGD (ESOPHAGOGASTRODUODENOSCOPY)
Anesthesia: General

## 2020-11-03 MED ORDER — TRAZODONE HCL 50 MG PO TABS: 50.0000 mg | ORAL_TABLET | Freq: Every evening | ORAL | Status: DC | PRN

## 2020-11-03 MED ORDER — SERTRALINE HCL 25 MG PO TABS
125.0000 mg | ORAL_TABLET | Freq: Every day | ORAL | Status: DC
  Administered 2020-11-03 – 2020-11-05 (×3): 125 mg via ORAL
  Filled 2020-11-03 (×3): qty 1

## 2020-11-03 MED ORDER — PROPOFOL 10 MG/ML IV BOLUS
INTRAVENOUS | Status: DC | PRN
  Administered 2020-11-03 (×2): 30 mg via INTRAVENOUS

## 2020-11-03 MED ORDER — PHENYLEPHRINE HCL-NACL 10-0.9 MG/250ML-% IV SOLN: INTRAVENOUS | Status: DC | PRN

## 2020-11-03 MED ORDER — OXYCODONE-ACETAMINOPHEN 5-325 MG PO TABS: 1.0000 | ORAL_TABLET | Freq: Four times a day (QID) | ORAL | Status: DC | PRN

## 2020-11-03 MED ORDER — SODIUM CHLORIDE (PF) 0.9 % IJ SOLN
PREFILLED_SYRINGE | INTRAMUSCULAR | Status: DC | PRN
  Administered 2020-11-03: 6 mL

## 2020-11-03 MED ORDER — EPINEPHRINE 1 MG/10ML IJ SOSY
PREFILLED_SYRINGE | INTRAMUSCULAR | Status: AC
  Filled 2020-11-03: qty 10

## 2020-11-03 MED ORDER — ATORVASTATIN CALCIUM 80 MG PO TABS
80.0000 mg | ORAL_TABLET | Freq: Every day | ORAL | Status: DC
  Administered 2020-11-03 – 2020-11-05 (×3): 80 mg via ORAL
  Filled 2020-11-03 (×3): qty 1

## 2020-11-03 MED ORDER — ONDANSETRON HCL 4 MG/2ML IJ SOLN
INTRAMUSCULAR | Status: DC | PRN
  Administered 2020-11-03: 4 mg via INTRAVENOUS

## 2020-11-03 MED ORDER — LIDOCAINE 5 % EX PTCH
1.0000 | MEDICATED_PATCH | CUTANEOUS | Status: DC
  Administered 2020-11-03 – 2020-11-05 (×3): 1 via TRANSDERMAL
  Filled 2020-11-03 (×3): qty 1

## 2020-11-03 MED ORDER — SERTRALINE HCL 100 MG PO TABS: 100.0000 mg | ORAL_TABLET | Freq: Every day | ORAL | Status: DC

## 2020-11-03 MED ORDER — LIDOCAINE 2% (20 MG/ML) 5 ML SYRINGE
INTRAMUSCULAR | Status: DC | PRN
  Administered 2020-11-03: 40 mg via INTRAVENOUS

## 2020-11-03 MED ORDER — PHENYLEPHRINE 40 MCG/ML (10ML) SYRINGE FOR IV PUSH (FOR BLOOD PRESSURE SUPPORT)
PREFILLED_SYRINGE | INTRAVENOUS | Status: DC | PRN
  Administered 2020-11-03: 80 ug via INTRAVENOUS

## 2020-11-03 MED ORDER — ONDANSETRON HCL 4 MG PO TABS: 4.0000 mg | ORAL_TABLET | Freq: Once | ORAL | Status: DC

## 2020-11-03 MED ORDER — PROPOFOL 500 MG/50ML IV EMUL
INTRAVENOUS | Status: DC | PRN
  Administered 2020-11-03: 125 ug/kg/min via INTRAVENOUS

## 2020-11-03 MED ORDER — LACTATED RINGERS IV SOLN: INTRAVENOUS | Status: DC | PRN

## 2020-11-03 MED ORDER — OXYCODONE-ACETAMINOPHEN 5-325 MG PO TABS
1.0000 | ORAL_TABLET | Freq: Four times a day (QID) | ORAL | Status: DC | PRN
Start: 2020-11-03 — End: 2020-11-05
  Administered 2020-11-03: 1 via ORAL
  Administered 2020-11-04 (×2): 2 via ORAL
  Administered 2020-11-04: 1 via ORAL
  Administered 2020-11-05 (×2): 2 via ORAL
  Filled 2020-11-03 (×4): qty 2
  Filled 2020-11-03 (×2): qty 1

## 2020-11-03 MED ORDER — ONDANSETRON HCL 4 MG PO TABS
4.0000 mg | ORAL_TABLET | Freq: Every day | ORAL | Status: DC | PRN
  Administered 2020-11-03: 4 mg via ORAL
  Filled 2020-11-03: qty 1

## 2020-11-03 MED ORDER — PANTOPRAZOLE INFUSION (NEW) - SIMPLE MED
8.0000 mg/h | INTRAVENOUS | Status: DC
  Administered 2020-11-03 – 2020-11-04 (×3): 8 mg/h via INTRAVENOUS
  Filled 2020-11-03: qty 80
  Filled 2020-11-03: qty 100
  Filled 2020-11-03 (×2): qty 80
  Filled 2020-11-03: qty 100

## 2020-11-03 MED ORDER — SERTRALINE HCL 25 MG PO TABS: 25.0000 mg | ORAL_TABLET | Freq: Every day | ORAL | Status: DC

## 2020-11-03 MED ORDER — POLYETHYLENE GLYCOL 3350 17 GM/SCOOP PO POWD
1.0000 | Freq: Once | ORAL | Status: AC
  Administered 2020-11-03: 255 g via ORAL
  Filled 2020-11-03: qty 255

## 2020-11-03 NOTE — Op Note (Signed)
[facility] Patient Name: Peggy House Procedure Date : 11/03/2020 MRN: [id number] Attending MD: [name] , MD Date of Birth: July 17, 1960 CSN: [id number] Age: 60 Admit Type: Inpatient Procedure:                Upper GI endoscopy Indications:              Melena Providers:                [name], MD, Dulcy Fanny, Ladona Ridgel, Technician Referring MD:             hospital team Medicines:                Propofol per Anesthesia, Monitored Anesthesia Care Complications:            No immediate complications. Estimated Blood Loss:     Estimated blood loss was minimal. Procedure:                Pre-Anesthesia Assessment:                           - Prior to the procedure, a History and Physical                            was performed, and patient medications and                            allergies were reviewed. The patient's tolerance of                            previous anesthesia was also reviewed. The risks                            and benefits of the procedure and the sedation                            options and risks were discussed with the patient.                            All questions were answered, and informed consent                            was obtained. Prior Anticoagulants: The patient has                            taken no previous anticoagulant or antiplatelet                            agents. ASA Grade Assessment: III - A patient with                            severe systemic disease. After reviewing the risks  and benefits, the patient was deemed in                            satisfactory condition to undergo the procedure.                           After obtaining informed consent, the endoscope was                            passed under direct vision. Throughout the                            procedure, the patient's blood pressure, pulse, and                             oxygen saturations were monitored continuously. The                            [id number] Olympus endoscope was introduced                            through the mouth, and advanced to the second part                            of duodenum. The upper GI endoscopy was                            accomplished without difficulty. The patient                            tolerated the procedure well. Scope In: Scope Out: Findings:      The examined esophagus was normal.      The Z-line was regular and was found 40 cm from the incisors.      Patchy hemorrhagic mucosa with no bleeding and stigmata of recent       bleeding was found in the gastric fundus and in the gastric body.      A medium-sized hiatal hernia was present.      The examined duodenum was normal.      One oozing cratered gastric ulcer with pigmented material was found in       the gastric body. The lesion was 4 mm in largest dimension. Area was       successfully injected with 6 mL of a 1:10,000 solution of epinephrine       for hemostasis. Estimated blood loss was minimal. Impression:               - Normal esophagus.                           - Z-line regular, 40 cm from the incisors.                           - Hemorrhagic gastropathy.                           - Medium-sized  hiatal hernia.                           - Normal examined duodenum.                           - Oozing gastric ulcer with pigmented material.                            Injected.                           - No specimens collected. Recommendation:           - Clear liquid diet.                           - Observe patient's clinical course.                           - Give Protonix (pantoprazole): 8 mg/hr IV by                            continuous infusion. Procedure Code(s):        --- Professional ---                           [phone], Esophagogastroduodenoscopy, flexible,                            transoral; with control of  bleeding, any method Diagnosis Code(s):        --- Professional ---                           K92.1, Melena (includes Hematochezia)                           K92.2, Gastrointestinal hemorrhage, unspecified                           K44.9, Diaphragmatic hernia without obstruction or                            gangrene CPT copyright 2019 [employer]. All rights reserved. The codes documented in this report are preliminary and upon coder review may  be revised to meet current compliance requirements. [name], MD 11/03/2020 10:58:08 AM This report has been signed electronically. Number of Addenda: 0

## 2020-11-03 NOTE — Plan of Care (Signed)
New admission

## 2020-11-03 NOTE — Anesthesia Procedure Notes (Signed)
Procedure Name: MAC Date/Time: 11/03/2020 10:18 AM Performed by: Reece Agar, CRNA Pre-anesthesia Checklist: Patient identified, Emergency Drugs available, Suction available, Patient being monitored and Timeout performed Patient Re-evaluated:Patient Re-evaluated prior to induction Oxygen Delivery Method: Nasal cannula

## 2020-11-03 NOTE — Progress Notes (Signed)
Subjective: The patient was seen at bedside today. She was drowsy and fell asleep a few times when I talked to her, but she was AAOx3. She endorses continued lower back pain. That state she is mildly SOB. At the end of the visit, she urinated herself and asked for her nurse. Otherwise, there were no other complaints or concerns.   Objective:  Vital signs in last 24 hours: Vitals:   11/02/20 2025 11/03/20 0015 11/03/20 0404 11/03/20 0718  BP: (!) 156/75 (!) 151/75 (!) 150/76 (!) 143/104  Pulse: 75 77 80 82  Resp: '18 15 20 18  '$ Temp: 98.7 F (37.1 C) 98.9 F (37.2 C) 99 F (37.2 C) 100.3 F (37.9 C)  TempSrc: Axillary Axillary Axillary Oral  SpO2: 90% 96% 91% 96%    General: Normal appearance, sleepy-appearing, NAD HE: Normocephalic, atraumatic , EOMI, Conjunctivae normal ENT: No congestion, no rhinorrhea, no exudate or erythema  Cardiovascular: Normal rate, regular rhythm. Grade II/IV holosystolic murmur heard at LUSB and RUSB Pulmonary: Effort normal, breath sounds normal. No wheezes, rales, or rhonchi Abdominal: soft, nontender, bowel sounds present Musculoskeletal: no swelling, deformity, injury. TTP in lumbar spine and associated paraspinal muscles Skin: Warm, dry, no bruising, erythema, or rash Neuro: Responses to questions were delayed but appropriate. AAO x 3. No focal deficits. Psychiatric/Behavioral: normal mood, normal behavior    Assessment/Plan:  Active Problems:   Hereditary hemorrhagic telangiectasia (HCC)   Acute GI bleeding   Syncope  Syncope in the Setting of Blood Loss Likely 2/2 to AVMs/HHT: Patient has a history of HHT for which she is followed by Dr. Burr Medico and receives IV ferrous gluconate and blood transfusions as needed. Patient is hemodynamically stable at this time. CTA showed no signs of an active bleed. GI is on board and recommending EGD and colonoscopy, which will be performed today.  - Appreciate GIs and Dr. Ernestina Penna recommendations. - Plan for  EGD and colonoscopy today, 11/03/2020 at 10 AM - Follow-up posttransfusion H&H continue to transfuse as needed to maintain hemoglobin greater than 7.5 - Protonix 40 mg IV twice daily - Continue telemetry  Back pain, acute on chronic Ms. [place]ley[place] endorses severe lower back pain, acute on chronic, from her fall prior to admission. We obtained lumbar spine films yesterday, but these were unremarkable for an acute abnormality. She was drowsy on exam today but AAO x 3. She had been receiving IV dilaudid q2h PRN since yesterday, so this is likely why she became more drowsy today. We have discontinued Dilaudid and will give her a lidocaine patch for her back pain. Can switch to oral pain meds after her procedure. - Lidocaine patch - Tylenol 605 mg q6h PRN - Methocarbamol 500 mg q8h PRN   Depression Anxiety: Patient with a history of depression and anxiety, currently treated with sertraline 125 mg daily, trazodone 100 mg nightly, and alprazolam 0.5 mg nightly as needed. We will continue her sertraline, but we are holding her trazodone currently due to possible increased risk of bleed with this medication. - Holding trazodone in the setting of recent blood loss.  - Sertraline 125 mg daily  Hypertension: Patient on hydrochlorothiazide 12.5 mg daily and amlodipine 10 mg daily blood pressures remain stable at this time in the setting of GI blood loss, so we will hold her home medications.  Hyperlipidemia Patient with history of hyperlipidemia takes Lipitor 80 mg daily we will continue during her hospitalization. - Lipitor 80 mg daily   Type 2 Diabetes Mellitus: A1c well-controlled at  5.0, taking metformin 500 mg twice daily at home. We will continue SSI - Sliding scale insulin   Prior to Admission Living Arrangement: Home Anticipated Discharge Location: Home Barriers to Discharge: Management of hemodynamic status and HHT Dispo: Anticipated discharge in approximately 1-2 day(s).   Orvis Brill, MD 11/03/2020, 8:26 AM Pager: [phone]  After 5pm on weekdays and 1pm on weekends: On Call pager [phone]

## 2020-11-03 NOTE — Transfer of Care (Signed)
Immediate Anesthesia Transfer of Care Note  Patient: Peggy House  Procedure(s) Performed: ESOPHAGOGASTRODUODENOSCOPY (EGD) HEMOSTASIS CONTROL  Patient Location: PACU  Anesthesia Type:MAC  Level of Consciousness: awake and alert   Airway & Oxygen Therapy: Patient Spontanous Breathing and Patient connected to nasal cannula oxygen  Post-op Assessment: Report given to RN and Post -op Vital signs reviewed and stable  Post vital signs: Reviewed and stable  Last Vitals:  Vitals Value Taken Time  BP 166/60 11/03/20 1046  Temp    Pulse 86 11/03/20 1046  Resp 21 11/03/20 1046  SpO2 100 % 11/03/20 1046    Last Pain:  Vitals:   11/03/20 1046  TempSrc:   PainSc: 0-No pain      Patients Stated Pain Goal: 5 (50/41/36 4383)  Complications: No notable events documented.

## 2020-11-03 NOTE — Anesthesia Preprocedure Evaluation (Addendum)
Anesthesia Evaluation  Patient identified by MRN, date of birth, ID band Patient awake    Reviewed: Allergy & Precautions, NPO status , Patient's Chart, lab work & pertinent test results  Airway Mallampati: II  TM Distance: >3 FB     Dental   Pulmonary Current Smoker and Patient abstained from smoking.,    breath sounds clear to auscultation       Cardiovascular hypertension,  Rhythm:Regular Rate:Normal     Neuro/Psych  Neuromuscular disease    GI/Hepatic Neg liver ROS, GERD  ,  Endo/Other  diabetes  Renal/GU negative Renal ROS     Musculoskeletal   Abdominal   Peds  Hematology   Anesthesia Other Findings   Reproductive/Obstetrics                             Anesthesia Physical Anesthesia Plan  ASA: 3  Anesthesia Plan: General   Post-op Pain Management:    Induction: Intravenous  PONV Risk Score and Plan: 2 and Ondansetron  Airway Management Planned: Nasal Cannula and Simple Face Mask  Additional Equipment:   Intra-op Plan:   Post-operative Plan:   Informed Consent: I have reviewed the patients History and Physical, chart, labs and discussed the procedure including the risks, benefits and alternatives for the proposed anesthesia with the patient or authorized representative who has indicated his/her understanding and acceptance.     Dental advisory given  Plan Discussed with: CRNA and Anesthesiologist  Anesthesia Plan Comments:        Anesthesia Quick Evaluation

## 2020-11-03 NOTE — Interval H&P Note (Signed)
History and Physical Interval Note:  11/03/2020 10:10 AM  Peggy House  has presented today for surgery, with the diagnosis of GI bleeding, anemia.  The various methods of treatment have been discussed with the patient and family. After consideration of risks, benefits and other options for treatment, the patient has consented to  Procedure(s): ESOPHAGOGASTRODUODENOSCOPY (EGD) (N/A) COLONOSCOPY (N/A) as a surgical intervention.  The patient's history has been reviewed, patient examined, no change in status, stable for surgery.  I have reviewed the patient's chart and labs.  Questions were answered to the patient's satisfaction.     [name]

## 2020-11-03 NOTE — Progress Notes (Signed)
Pt had 2 episodes of melena and an episode of vomiting. Large amount of melena present. Small amount of clear vomitus. MD Schooler notified. No new orders.

## 2020-11-03 NOTE — Progress Notes (Addendum)
HEMATOLOGY-ONCOLOGY PROGRESS NOTE  SUBJECTIVE: The patient received a dose of IV iron yesterday and tolerated it well.  She underwent EGD earlier today which showed hemorrhagic gastropathy, medium size hiatal hernia, oozing gastric ulcer with pigmented material that was injected.  Colonoscopy was not attempted due to reported black stools.  May reconsider colonoscopy in the next 1 to 2 days if black stools persist.  The patient reports ongoing back pain this afternoon.  Pain located in her mid to lower back.  Pain medications moderately effective.  REVIEW OF SYSTEMS:   Constitutional: Denies fevers, chills Eyes: Denies blurriness of vision Ears, nose, mouth, throat, and face: Denies mucositis or sore throat Respiratory: Denies cough, dyspnea or wheezes Cardiovascular: Denies palpitation, chest discomfort Gastrointestinal: Denies abdominal pain, nausea, vomiting.  Continues to have melena. Skin: Denies abnormal skin rashes Lymphatics: Denies new lymphadenopathy or easy bruising Neurological:Denies numbness, tingling or new weaknesses Behavioral/Psych: Mood is stable, no new changes  Extremities: No lower extremity edema All other systems were reviewed with the patient and are negative.  I have reviewed the past medical history, past surgical history, social history and family history with the patient and they are unchanged from previous note.   PHYSICAL EXAMINATION: ECOG PERFORMANCE STATUS: 3 - Symptomatic, >50% confined to bed  Vitals:   11/03/20 0929 11/03/20 1046  BP: (!) 203/60 (!) 166/60  Pulse: 79 86  Resp: (!) 23 (!) 21  Temp: (!) 100.5 F (38.1 C)   SpO2: 98% 100%   Filed Weights   11/03/20 0929  Weight: 111.1 kg    Intake/Output from previous day: 07/21 0701 - 07/22 0700 In: 801.2 [Blood:551.2; IV Piggyback:250] Out: 500 [Urine:500]  GENERAL:alert, no distress and comfortable SKIN: skin color, texture, turgor are normal, no rashes or significant lesions EYES:  normal, Conjunctiva are pink and non-injected, sclera clear OROPHARYNX:no exudate, no erythema and lips, buccal mucosa, and tongue normal  LUNGS: clear to auscultation and percussion with normal breathing effort HEART: regular rate & rhythm and no murmurs and no lower extremity edema ABDOMEN:abdomen soft, non-tender and normal bowel sounds NEURO: alert & oriented x 3 with fluent speech, no focal motor/sensory deficits  LABORATORY DATA:  I have reviewed the data as listed CMP Latest Ref Rng & Units 11/02/2020 11/01/2020 10/12/2020  Glucose 70 - 99 mg/dL 91 140(H) 100(H)  BUN 6 - 20 mg/dL 23(H) 9 9  Creatinine 0.44 - 1.00 mg/dL 0.74 1.02(H) 0.86  Sodium 135 - 145 mmol/L 141 138 142  Potassium 3.5 - 5.1 mmol/L 3.9 3.0(L) 3.6  Chloride 98 - 111 mmol/L 108 106 110  CO2 22 - 32 mmol/L '26 25 25  '$ Calcium 8.9 - 10.3 mg/dL 8.7(L) 8.1(L) 8.7(L)  Total Protein 6.5 - 8.1 g/dL - 5.2(L) 6.4(L)  Total Bilirubin 0.3 - 1.2 mg/dL - 0.5 <0.2(L)  Alkaline Phos 38 - 126 U/L - 48 66  AST 15 - 41 U/L - 16 12(L)  ALT 0 - 44 U/L - 10 <6    Lab Results  Component Value Date   WBC 12.8 (H) 11/03/2020   HGB 7.8 (L) 11/03/2020   HCT 25.9 (L) 11/03/2020   MCV 87.2 11/03/2020   PLT 312 11/03/2020   NEUTROABS 11.8 (H) 11/01/2020    DG Lumbar Spine 2-3 Views  Result Date: 11/02/2020 CLINICAL DATA:  Recent fall with low back pain, initial encounter EXAM: LUMBAR SPINE - 3 VIEW COMPARISON:  11/08/2010 FINDINGS: Five lumbar type vertebral bodies are well visualized. Vertebral body height is well maintained.  No anterolisthesis is noted. Mild disc space narrowing is noted at L4-5. Mild osteophytic changes are seen. Aortic calcifications are noted without aneurysmal dilatation. Coils are noted in the left upper quadrant. IMPRESSION: Mild degenerative change without acute abnormality. Electronically Signed   By: Inez Catalina M.D.   On: 11/02/2020 09:34   CT Angio Abd/Pel W and/or Wo Contrast  Result Date:  11/01/2020 CLINICAL DATA:  Abdominal pain, nausea, vomiting, rectal bleeding EXAM: CTA ABDOMEN AND PELVIS WITHOUT AND WITH CONTRAST TECHNIQUE: Multidetector CT imaging of the abdomen and pelvis was performed using the standard protocol during bolus administration of intravenous contrast. Multiplanar reconstructed images and MIPs were obtained and reviewed to evaluate the vascular anatomy. CONTRAST:  112m OMNIPAQUE IOHEXOL 350 MG/ML SOLN COMPARISON:  CT 09/14/2020 FINDINGS: VASCULAR Aorta: Mild calcified plaque.  No aneurysm, dissection, or stenosis. Celiac: Patent without evidence of aneurysm, dissection, vasculitis or significant stenosis. SMA: Replaced right arterial supply, an anatomic variant. No aneurysm or stenosis. Renals: Single left, with calcified ostial plaque resulting in short segment stenosis of at least mild severity. Single right, widely patent. IMA: Patent without evidence of aneurysm, dissection, vasculitis or significant stenosis. Inflow: Calcified nonocclusive plaque through bilateral common iliac arteries and in the proximal internal iliac arteries. The external iliac arteries are unremarkable. Proximal Outflow: Mildly atheromatous, patent Veins: Patent patent veins, portal vein, SM V, splenic vein, bilateral renal veins, IVC, iliac venous system. No venous pathology identified. Review of the MIP images confirms the above findings. NON-VASCULAR Lower chest: Linear scarring or subsegmental atelectasis posteriorly at the lung bases. No pleural or pericardial effusion. Hepatobiliary: 7 mm calcification near the gallbladder neck. The gallbladder is nondistended. No focal liver lesion or biliary ductal dilatation. Pancreas: Unremarkable. No pancreatic ductal dilatation or surrounding inflammatory changes. Spleen: Normal in size without focal abnormality. Adrenals/Urinary Tract: Adrenal glands unremarkable. 1.7 cm probable cyst, right upper pole kidney. No hydronephrosis. Urinary bladder incompletely  distended. Stomach/Bowel: Stomach is distended by ingested material. Surgical clips along the lesser and greater curvature of the stomach. Hyperdense regions along the posterior wall in the gastric body on the arterial phase scan, without definite accumulation or persistence on the delayed scans to suggest active bleeding. The small bowel is decompressed. Normal appendix. The colon is nondilated, unremarkable. No definite active GI bleed. Lymphatic: No abdominal or pelvic adenopathy. Reproductive: Status post hysterectomy. No adnexal masses. Other: No ascites.  No free air. Musculoskeletal: Coarse calcifications in the anterior pelvic body wall presumably postoperative. Small paraumbilical hernia containing only mesenteric fat. No fracture or worrisome bone lesion. IMPRESSION: 1. Enhancement in the posterior mucosa of the stomach without convincing CT evidence of active hemorrhage. Correlate with endoscopic findings. 2. No evidence of acute GI bleed. 3. Aortoiliac  atherosclerosis (ICD10-170.0). Electronically Signed   By: DLucrezia EuropeM.D.   On: 11/01/2020 09:18    ASSESSMENT AND PLAN: 1.  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  HHT  3.  Syncope in the setting of blood loss anemia 4.  Depression anxiety 5.  Hypertension 6.  Hyperlipidemia 7.  Type II diabetes mellitus 8.  Back pain  -Status post 2 units PRBCs and IV iron.  Hemoglobin improved to 7.8.  Recommend close monitoring of hemoglobin and transfuse for hemoglobin less than 7.5. -Will hold on bevacizumab during her hospitalization and resume as an outpatient. -Status post EGD per GI.  May consider for colonoscopy if black stools persist. -She has back pain of unclear etiology.  Imaging so far  does not show any acute abnormality.  Would consider getting an MRI of the thoracic and lumbar spine if pain persist.  Continue Percocet.  I have adjusted dose for her to receive 1 to 2 tablets every 6 hours as needed for severe pain.    LOS: 2 days   Mikey Bussing, DNP, AGPCNP-BC, AOCNP 11/03/20  Addendum  I have seen the patient, examined her. I agree with the assessment and and plan and have edited the notes.   Harlow still complains of significant back pain, she failed on her back at home when she had a syncope episode.  X-ray was negative for fracture, she likely suffered muscle muscular strain. Her hemoglobin was 10.8 this morning, status post 2 units blood transfusion and 1 dose IV iron.  I reviewed her EGD findings and discussed with patient.  She will likely proceed with colonoscopy per GI.  Please continue close monitoring, blood transfusion if hemoglobin less than 7.5, and give second dose ferric gluconate to 50 mg early next week. I will f/u on Monday if she is still in house.  Truitt Merle  11/03/2020

## 2020-11-03 NOTE — Anesthesia Postprocedure Evaluation (Signed)
Anesthesia Post Note  Patient: Peggy House  Procedure(s) Performed: ESOPHAGOGASTRODUODENOSCOPY (EGD) HEMOSTASIS CONTROL     Anesthesia Post Evaluation No notable events documented.  Last Vitals:  Vitals:   11/03/20 0929 11/03/20 1046  BP: (!) 203/60 (!) 166/60  Pulse: 79 86  Resp: (!) 23 (!) 21  Temp: (!) 38.1 C   SpO2: 98% 100%    Last Pain:  Vitals:   11/03/20 1046  TempSrc:   PainSc: 0-No pain                 Persia Lintner

## 2020-11-03 NOTE — Brief Op Note (Signed)
Small bleeding ulcer that was injected with [id number] mixture and hemostasis was achieved. Colonoscopy was not attempted due to black stools reported. Black stool in post and nonbloody vomiting. Treat with Ondansetron prn. Additional colon prep today and tomorrow and if black stools persist then may need a colonoscopy in the next 1-2 days. Dr. Paulita Fujita on call this weekend.

## 2020-11-04 DIAGNOSIS — G8929 Other chronic pain: Secondary | ICD-10-CM | POA: Diagnosis not present

## 2020-11-04 DIAGNOSIS — K625 Hemorrhage of anus and rectum: Secondary | ICD-10-CM | POA: Diagnosis not present

## 2020-11-04 DIAGNOSIS — M549 Dorsalgia, unspecified: Secondary | ICD-10-CM | POA: Diagnosis not present

## 2020-11-04 DIAGNOSIS — D62 Acute posthemorrhagic anemia: Secondary | ICD-10-CM | POA: Diagnosis not present

## 2020-11-04 DIAGNOSIS — F32A Depression, unspecified: Secondary | ICD-10-CM | POA: Diagnosis not present

## 2020-11-04 DIAGNOSIS — K922 Gastrointestinal hemorrhage, unspecified: Secondary | ICD-10-CM | POA: Diagnosis not present

## 2020-11-04 DIAGNOSIS — E119 Type 2 diabetes mellitus without complications: Secondary | ICD-10-CM | POA: Diagnosis not present

## 2020-11-04 DIAGNOSIS — E785 Hyperlipidemia, unspecified: Secondary | ICD-10-CM | POA: Diagnosis not present

## 2020-11-04 DIAGNOSIS — R55 Syncope and collapse: Secondary | ICD-10-CM | POA: Diagnosis not present

## 2020-11-04 DIAGNOSIS — I10 Essential (primary) hypertension: Secondary | ICD-10-CM | POA: Diagnosis not present

## 2020-11-04 DIAGNOSIS — I780 Hereditary hemorrhagic telangiectasia: Secondary | ICD-10-CM | POA: Diagnosis not present

## 2020-11-04 DIAGNOSIS — F419 Anxiety disorder, unspecified: Secondary | ICD-10-CM | POA: Diagnosis not present

## 2020-11-04 DIAGNOSIS — D649 Anemia, unspecified: Secondary | ICD-10-CM | POA: Diagnosis not present

## 2020-11-04 LAB — CBC
HCT: 26 % — ABNORMAL LOW (ref 36.0–46.0)
Hemoglobin: 7.5 g/dL — ABNORMAL LOW (ref 12.0–15.0)
MCH: 25.9 pg — ABNORMAL LOW (ref 26.0–34.0)
MCHC: 28.8 g/dL — ABNORMAL LOW (ref 30.0–36.0)
MCV: 89.7 fL (ref 80.0–100.0)
Platelets: 297 10*3/uL (ref 150–400)
RBC: 2.9 MIL/uL — ABNORMAL LOW (ref 3.87–5.11)
RDW: 21.4 % — ABNORMAL HIGH (ref 11.5–15.5)
WBC: 15.2 10*3/uL — ABNORMAL HIGH (ref 4.0–10.5)
nRBC: 0.1 % (ref 0.0–0.2)

## 2020-11-04 LAB — BASIC METABOLIC PANEL
Anion gap: 7 (ref 5–15)
BUN: 6 mg/dL (ref 6–20)
CO2: 25 mmol/L (ref 22–32)
Calcium: 9.1 mg/dL (ref 8.9–10.3)
Chloride: 108 mmol/L (ref 98–111)
Creatinine, Ser: 0.78 mg/dL (ref 0.44–1.00)
GFR, Estimated: >60 mL/min (ref >60)
Glucose, Bld: 119 mg/dL — ABNORMAL HIGH (ref 70–99)
Potassium: 3.6 mmol/L (ref 3.5–5.1)
Sodium: 140 mmol/L (ref 135–145)

## 2020-11-04 LAB — GLUCOSE, CAPILLARY
Glucose-Capillary: 109 mg/dL — ABNORMAL HIGH (ref 70–99)
Glucose-Capillary: 100 mg/dL — ABNORMAL HIGH (ref 70–99)
Glucose-Capillary: 99 mg/dL (ref 70–99)
Glucose-Capillary: 133 mg/dL — ABNORMAL HIGH (ref 70–99)

## 2020-11-04 MED ORDER — POLYETHYLENE GLYCOL 3350 17 G PO PACK
17.0000 g | PACK | Freq: Two times a day (BID) | ORAL | Status: DC
  Administered 2020-11-04 – 2020-11-05 (×3): 17 g via ORAL
  Filled 2020-11-04 (×3): qty 1

## 2020-11-04 NOTE — Progress Notes (Signed)
Subjective: No more dark stools (patient not aware of any).  Objective: Vital signs in last 24 hours: Temp:  [97.9 F (36.6 C)-99.8 F (37.7 C)] 98.6 F (37 C) (07/23 0753) Pulse Rate:  [60-86] 63 (07/23 0753) Resp:  [15-21] 17 (07/23 0753) BP: (111-166)/(45-72) 121/57 (07/23 0753) SpO2:  [95 %-100 %] 98 % (07/23 0753) Weight change:  Last BM Date: 11/04/20  PE: GEN:  NAD, overweight, chronically ill-appearing ABD:  Soft, non-tender  Lab Results: CBC    Component Value Date/Time   WBC 15.2 (H) 11/04/2020 0040   RBC 2.90 (L) 11/04/2020 0040   HGB 7.5 (L) 11/04/2020 0040   HGB 6.4 (LL) 10/20/2020 1234   HGB 8.6 (L) 06/14/2020 1626   HGB 8.1 (L) 02/05/2017 1407   HCT 26.0 (L) 11/04/2020 0040   HCT 28.2 (L) 06/14/2020 1626   HCT 26.6 (L) 02/05/2017 1407   PLT 297 11/04/2020 0040   PLT 295 10/20/2020 1234   PLT 353 06/14/2020 1626   MCV 89.7 11/04/2020 0040   MCV 89 06/14/2020 1626   MCV 82.8 02/05/2017 1407   MCH 25.9 (L) 11/04/2020 0040   MCHC 28.8 (L) 11/04/2020 0040   RDW 21.4 (H) 11/04/2020 0040   RDW 18.9 (H) 06/14/2020 1626   RDW 22.6 (H) 02/05/2017 1407   LYMPHSABS 1.2 11/01/2020 1844   LYMPHSABS 2.5 05/20/2017 1551   LYMPHSABS 2.6 02/05/2017 1407   MONOABS 0.7 11/01/2020 1844   MONOABS 0.5 02/05/2017 1407   EOSABS 0.1 11/01/2020 1844   EOSABS 0.1 05/20/2017 1551   BASOSABS 0.0 11/01/2020 1844   BASOSABS 0.0 05/20/2017 1551   BASOSABS 0.1 02/05/2017 1407  CMP     Component Value Date/Time   NA 140 11/04/2020 0040   NA 147 (H) 04/02/2017 1708   NA 141 02/05/2017 1407   K 3.6 11/04/2020 0040   K 3.5 02/05/2017 1407   CL 108 11/04/2020 0040   CO2 25 11/04/2020 0040   CO2 24 02/05/2017 1407   GLUCOSE 119 (H) 11/04/2020 0040   GLUCOSE 134 02/05/2017 1407   BUN 6 11/04/2020 0040   BUN 5 (L) 04/02/2017 1708   BUN 9.3 02/05/2017 1407   CREATININE 0.78 11/04/2020 0040   CREATININE 0.86 10/12/2020 1405   CREATININE 0.9 02/05/2017 1407   CALCIUM 9.1  11/04/2020 0040   CALCIUM 8.9 02/05/2017 1407   PROT 5.2 (L) 11/01/2020 0622   PROT 6.7 04/02/2017 1708   PROT 7.1 02/05/2017 1407   ALBUMIN 2.4 (L) 11/01/2020 0622   ALBUMIN 3.8 04/02/2017 1708   ALBUMIN 3.5 02/05/2017 1407   AST 16 11/01/2020 0622   AST 12 (L) 10/12/2020 1405   AST 13 02/05/2017 1407   ALT 10 11/01/2020 0622   ALT <6 10/12/2020 1405   ALT 8 02/05/2017 1407   ALKPHOS 48 11/01/2020 0622   ALKPHOS 84 02/05/2017 1407   BILITOT 0.5 11/01/2020 0622   BILITOT <0.2 (L) 10/12/2020 1405   BILITOT 0.23 02/05/2017 1407   GFRNONAA >60 11/04/2020 0040   GFRNONAA >60 10/12/2020 1405   GFRAA >60 01/06/2020 0921   Assessment:   Recurrent anemia.  Hgb stable. Melena, resolved? History AVMs.  EGD yesterday with oozing gastric ulcer, endoscopic therapy applied.  Plan:   Miralax 17 grams po bid, to help constipation and make sure no more obvious bleeding. Clear liquid diet today. [facility] will follow.   Peggy House 11/04/2020, 10:38 AM   Cell [phone] If no answer or after 5 PM call [phone]

## 2020-11-04 NOTE — Progress Notes (Signed)
Subjective: The patient was seen at bedside today. She endorses continued lower back pain. No other episode of dark stool and vomiting since her procedure yesterday. The nurse gave her a dose of Robaxin while we were in the room.   Objective:  Vital signs in last 24 hours: Vitals:   11/03/20 1910 11/03/20 2015 11/04/20 0435 11/04/20 0753  BP:  (!) 133/53 111/72 (!) 121/57  Pulse:  60 61 63  Resp:  '15 15 17  '$ Temp: 99.3 F (37.4 C) 98.6 F (37 C) 98.7 F (37.1 C) 98.6 F (37 C)  TempSrc: Oral Oral Axillary Oral  SpO2:  100% 97% 98%  Weight:      Height:        General: Normal appearance, NAD HE: Normocephalic, atraumatic , EOMI, Conjunctivae normal ENT: No congestion, no rhinorrhea, no exudate or erythema  Cardiovascular: Normal rate, regular rhythm. Grade II/IV holosystolic murmur heard at RUSB Pulmonary: Effort normal, breath sounds normal. No wheezes, rales, or rhonchi Abdominal: soft, nontender, bowel sounds present Musculoskeletal: no swelling, deformity, injury. TTP in lumbar spine and associated paraspinal muscles on the left Skin: Warm, dry, no bruising, erythema, or rash Neuro: AAO x 3. CN I-XII grossly intact. No focal deficits. Psychiatric/Behavioral: normal mood, normal behavior   Assessment/Plan:  Active Problems:   Hereditary hemorrhagic telangiectasia (HCC)   Acute GI bleeding   Syncope  Syncope in the Setting of Blood Loss Likely 2/2 to AVMs/HHT: Patient has a history of HHT for which she is followed by Dr. Burr Medico and receives IV ferrous gluconate and blood transfusions as needed. Patient is hemodynamically stable at this time. CTA showed no signs of an active bleed. GI performed EGD on 07/22 but unable to perform colonoscopy due to poor visualization from dark stools. Will give bowel prep and proceed with colonoscopy today. - Appreciate GIs and Dr. Ernestina Penna recommendations. - Plan for colonoscopy today, 11/04/2020 - Continue to transfuse as needed to maintain  hemoglobin greater than 7.5 - Protonix 40 mg IV twice daily - Continue telemetry  Back pain, acute on chronic Ms. Isley-W[place]ley endorses severe lower back pain, acute on chronic, from her fall prior to admission. We obtained lumbar spine films on 07/21, but these were unremarkable for an acute abnormality. On examination today, she had TTP in the lumbar spine as well as associated paraspinals on the left side. Unclear etiology but could be a muscle spasm; gave PRN robaxin today and will see how she responds - Lidocaine patch - Tylenol 605 mg q6h PRN - Methocarbamol 500 mg q8h PRN   Depression Anxiety: Patient with a history of depression and anxiety, currently treated with sertraline 125 mg daily, trazodone 100 mg nightly, and alprazolam 0.5 mg nightly as needed. We will continue her sertraline, but we are holding her trazodone currently due to possible increased risk of bleed with this medication. - Holding trazodone in the setting of recent blood loss.  - Sertraline 125 mg daily  Hypertension: Patient on hydrochlorothiazide 12.5 mg daily and amlodipine 10 mg daily blood pressures remain stable at this time in the setting of GI blood loss, so we will hold her home medications.  Hyperlipidemia Patient with history of hyperlipidemia takes Lipitor 80 mg daily we will continue during her hospitalization. - Lipitor 80 mg daily   Type 2 Diabetes Mellitus: A1c well-controlled at 5.0, taking metformin 500 mg twice daily at home. We will continue SSI - Sliding scale insulin   Prior to Admission Living Arrangement: Home Anticipated Discharge  Location: Home Barriers to Discharge: Management of hemodynamic status and HHT Dispo: Anticipated discharge in approximately 1-2 day(s).   Orvis Brill, MD 11/04/2020, 9:47 AM Pager: [phone]  After 5pm on weekdays and 1pm on weekends: On Call pager [phone]

## 2020-11-05 DIAGNOSIS — F419 Anxiety disorder, unspecified: Secondary | ICD-10-CM | POA: Diagnosis not present

## 2020-11-05 DIAGNOSIS — G8929 Other chronic pain: Secondary | ICD-10-CM | POA: Diagnosis not present

## 2020-11-05 DIAGNOSIS — E785 Hyperlipidemia, unspecified: Secondary | ICD-10-CM | POA: Diagnosis not present

## 2020-11-05 DIAGNOSIS — E119 Type 2 diabetes mellitus without complications: Secondary | ICD-10-CM | POA: Diagnosis not present

## 2020-11-05 DIAGNOSIS — K922 Gastrointestinal hemorrhage, unspecified: Secondary | ICD-10-CM | POA: Diagnosis not present

## 2020-11-05 DIAGNOSIS — D62 Acute posthemorrhagic anemia: Secondary | ICD-10-CM | POA: Diagnosis not present

## 2020-11-05 DIAGNOSIS — D649 Anemia, unspecified: Secondary | ICD-10-CM | POA: Diagnosis not present

## 2020-11-05 DIAGNOSIS — K625 Hemorrhage of anus and rectum: Secondary | ICD-10-CM | POA: Diagnosis not present

## 2020-11-05 DIAGNOSIS — R55 Syncope and collapse: Secondary | ICD-10-CM | POA: Diagnosis not present

## 2020-11-05 DIAGNOSIS — M549 Dorsalgia, unspecified: Secondary | ICD-10-CM | POA: Diagnosis not present

## 2020-11-05 DIAGNOSIS — F32A Depression, unspecified: Secondary | ICD-10-CM | POA: Diagnosis not present

## 2020-11-05 DIAGNOSIS — I10 Essential (primary) hypertension: Secondary | ICD-10-CM | POA: Diagnosis not present

## 2020-11-05 DIAGNOSIS — I780 Hereditary hemorrhagic telangiectasia: Secondary | ICD-10-CM | POA: Diagnosis not present

## 2020-11-05 LAB — CBC
HCT: 26.2 % — ABNORMAL LOW (ref 36.0–46.0)
Hemoglobin: 7.6 g/dL — ABNORMAL LOW (ref 12.0–15.0)
MCH: 26 pg (ref 26.0–34.0)
MCHC: 29 g/dL — ABNORMAL LOW (ref 30.0–36.0)
MCV: 89.7 fL (ref 80.0–100.0)
Platelets: 290 10*3/uL (ref 150–400)
RBC: 2.92 MIL/uL — ABNORMAL LOW (ref 3.87–5.11)
RDW: 21.2 % — ABNORMAL HIGH (ref 11.5–15.5)
WBC: 13.1 10*3/uL — ABNORMAL HIGH (ref 4.0–10.5)
nRBC: 0.2 % (ref 0.0–0.2)

## 2020-11-05 LAB — GLUCOSE, CAPILLARY
Glucose-Capillary: 88 mg/dL (ref 70–99)
Glucose-Capillary: 104 mg/dL — ABNORMAL HIGH (ref 70–99)

## 2020-11-05 LAB — BASIC METABOLIC PANEL
Anion gap: 8 (ref 5–15)
BUN: 5 mg/dL — ABNORMAL LOW (ref 6–20)
CO2: 25 mmol/L (ref 22–32)
Calcium: 8.9 mg/dL (ref 8.9–10.3)
Chloride: 105 mmol/L (ref 98–111)
Creatinine, Ser: 0.71 mg/dL (ref 0.44–1.00)
GFR, Estimated: >60 mL/min (ref >60)
Glucose, Bld: 91 mg/dL (ref 70–99)
Potassium: 3.8 mmol/L (ref 3.5–5.1)
Sodium: 138 mmol/L (ref 135–145)

## 2020-11-05 MED ORDER — SERTRALINE HCL 25 MG PO TABS: 125.0000 mg | ORAL_TABLET | Freq: Every day | ORAL | 0 refills | Status: DC

## 2020-11-05 MED ORDER — METHOCARBAMOL 500 MG PO TABS: 500.0000 mg | ORAL_TABLET | Freq: Three times a day (TID) | ORAL | 0 refills | Status: DC | PRN

## 2020-11-05 NOTE — Discharge Summary (Signed)
Name: Peggy House MRN: [id number] DOB: 1961-02-19 60 y.o. PCP: [name], MD  Date of Admission: 11/01/2020  6:15 AM Date of Discharge: 11/05/2020 Attending Physician: Sid Falcon, MD  Discharge Diagnosis: 1. 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 History of Hereditary Hemorrhagic Telangiectasia 3. Syncope in the setting of blood loss anemia 4. Depression/Anxiety 5. Hypertension 6. Hyperlipidemia 7. Type II Diabetes 8. Back pain, acute on chronic   Discharge Medications: Allergies as of 11/05/2020       Reactions   Feraheme [ferumoxytol] Other (See Comments)   Muscle tetany, encephalopathy, fatigue, nausea (reaction to injection)   Nsaids Other (See Comments)   Stomach bleeding episodes; Tylenol is OK   Tomato Hives   Iron (ferrous Sulfate) [ferrous Sulfate Er]    Other reaction(s): shock   Other Other (See Comments)   Wasp Venom Swelling        Medication List     STOP taking these medications    cetirizine 10 MG tablet Commonly known as: ZYRTEC   ciprofloxacin 500 MG tablet Commonly known as: Cipro   ondansetron 8 MG disintegrating tablet Commonly known as: Zofran ODT       TAKE these medications    acitretin 10 MG capsule Commonly known as: SORIATANE Take 10 mg by mouth daily.   albuterol 108 (90 Base) MCG/ACT inhaler Commonly known as: VENTOLIN HFA Inhale 1-2 puffs into the lungs every 6 (six) hours as needed for wheezing or shortness of breath.   amLODipine 10 MG tablet Commonly known as: NORVASC TAKE 1 TABLET(10 MG) BY MOUTH DAILY What changed: See the new instructions.   atorvastatin 80 MG tablet Commonly known as: LIPITOR TAKE 1 TABLET(80 MG) BY MOUTH DAILY What changed: See the new instructions.   diphenoxylate-atropine 2.5-0.025 MG tablet Commonly known as: LOMOTIL 1 to 2 PO QID prn diarrhea What changed:  how much to take how to take this when to take this reasons to take  this additional instructions   Flexitol Heel Balm Oint Apply 1 application topically as needed (dry skin).   furosemide 40 MG tablet Commonly known as: LASIX Take 40 mg by mouth daily.   hydrochlorothiazide 12.5 MG tablet Commonly known as: HYDRODIURIL TAKE 1 TABLET(12.5 MG) BY MOUTH DAILY What changed: See the new instructions.   metFORMIN 500 MG tablet Commonly known as: GLUCOPHAGE TAKE 1 TABLET(500 MG) BY MOUTH TWICE DAILY WITH A MEAL What changed: See the new instructions.   methocarbamol 500 MG tablet Commonly known as: ROBAXIN Take 1 tablet (500 mg total) by mouth every 8 (eight) hours as needed for muscle spasms (back pain/spasm).   metoprolol succinate 100 MG 24 hr tablet Commonly known as: TOPROL-XL Take 100 mg by mouth daily.   pantoprazole 40 MG tablet Commonly known as: PROTONIX TAKE 1 TABLET(40 MG) BY MOUTH TWICE DAILY What changed: See the new instructions.   potassium chloride 20 MEQ packet Commonly known as: KLOR-CON Take 20 mEq by mouth daily.   prochlorperazine 10 MG tablet Commonly known as: COMPAZINE Take 1 tablet (10 mg total) by mouth every 6 (six) hours as needed for nausea or vomiting.   sertraline 25 MG tablet Commonly known as: ZOLOFT Take 5 tablets (125 mg total) by mouth daily. Start taking on: November 06, 2020 What changed: how much to take   terbinafine 1 % cream Commonly known as: LamISIL AT Apply 1 application topically 2 (two) times daily. Both bottom and top of both feet and  toes   traMADol 50 MG tablet Commonly known as: ULTRAM Take 1 tablet (50 mg total) by mouth daily as needed for severe pain.   traZODone 100 MG tablet Commonly known as: DESYREL TAKE 1 TABLET(100 MG) BY MOUTH AT BEDTIME What changed: See the new instructions.       ASK your doctor about these medications    Accu-Chek Guide test strip Generic drug: glucose blood Check blood sugar 2 times per day while on steroids before breakfast and dinner    Accu-Chek Softclix Lancets lancets Use to check blood sugar before breakfast and before dinner while on steroids   ALPRAZolam 0.5 MG tablet Commonly known as: XANAX TAKE 1 TABLET(0.5 MG) BY MOUTH AT BEDTIME AS NEEDED FOR ANXIETY   Aminocaproic Acid 1000 MG Tabs Take 1 tablet (1,000 mg total) by mouth in the morning and at bedtime. TAKE 1 TABLET(1000 MG) BY MOUTH TWICE DAILY   lidocaine-prilocaine cream Commonly known as: EMLA Apply 1 application topically as needed.        Disposition and follow-up:   Ms.Misty E Isley-Wansley was discharged from [facility] in Stable condition.  At the hospital follow up visit please address:  1.  Anemia due to recurrent GI bleed, HHT, syncope in the setting of blood loss anemia:  Ms. Mandrell was admitted here with several days of recurrent nosebleeds and one episode of hematochezia after a fall. She is followed by Dr. Burr Medico for her [place] and IDA and receives IV gluconate, Avastin/Zirabev q 2wks and Amicar PRN. She also received PRN blood transfusions. In the hospital, she was given PRBC's x 2 for a goal Hgb >7.5 and IV ferrous gluconate per Dr. Burr Medico. GI was consulted and recommended an EGD. They located a bleeding ulcer in the stomach and administered an [id number] mixture to stop the bleeding. On post-op days 1 and 2, her Hgb was stable (discharge Hgb 7.6), and she did not have further episodes of melena warranting a colonoscopy by GI. She was discharged home and will be followed by both Dr. Burr Medico and GI.  2. Back pain, acute on chronic Ms. Isley-Wansley endorsed severe lower back pain, acute on chronic, from her fall prior to admission. We obtained lumbar spine films on 07/21, but these were unremarkable for an acute abnormality. On physical examination, she had TTP in the lumbar spine as well as associated paraspinals on the left side. She was given Robaxin which the patient states helped to alleviate the pain somewhat. Unclear  etiology but most likely seems like this is a muscle spasm. MRI not warranted at this time, but could consider this in the future.   Labs / imaging needed at time of follow-up: None  Pending labs/ test needing follow-up: None  Follow-up Appointments: GI Dr. Burr Medico [facility] Course by problem list: #Anemia due to recurrent GI bleed #HHT #Syncope in the setting of blood loss anemia Patient has a history of recurrent GI bleeds. She is followed by Dr. Burr Medico for her [place] and IDA and receives IV gluconate, Avastin/Zirabev q 2wks and Amicar PRN. She also received PRN blood transfusions. She presented here on 07/20 with several days of epistaxis and 1 large episode of hematochezia. On arrival, she had a Hgb of 6.0; she received 2 blood transfusions to achieve a goal Hgb >7.5. CTA was unremarkable for an active bleed.  GI was consulted and recommended an EGD + colonoscopy. She was also followed by Dr. Burr Medico in the hospital and was given IV  ferrous gluconate. On 07/22, GI performed an endoscopy and found a small bleeding ulcer in the stomach which was injected with [id number] mixture. A colonoscopy was not performed due to poor visualization of the colon from black stools. The next two days in the hospital, the patient's hemoglobin remained stable, and she did not have other episode of melena. Therefore, colonoscopy was not recommended by GI, and the patient was discharged with recommendation to follow-up with her PCP and Dr. Burr Medico.  #Back pain, acute on chronic Ms. Isley-Wansley endorsed severe lower back pain, acute on chronic, from her fall prior to admission. We obtained lumbar spine films on 07/21, but these were unremarkable for an acute abnormality. On physical examination, she had TTP in the lumbar spine as well as associated paraspinals on the left side. She was briefly given dialudid q2h PRN but had significant drowsiness from this and was switched to Robaxin PRN. Prior to discharge, she reported  that her back pain had somewhat improved after Robaxin.   Depression Anxiety: Her home sertraline, trazodone, and alprazolam PRN regimens were continued during her hospital stay.   Hypertension: Her home hydrochlorothiazide and amlodipine regimens were held in the setting of GI blood loss. She was discharged with instruction to resume these medications the following day.  Hyperlipidemia Home lipitor regimen was continued during her hospital stay.  Type 2 Diabetes Mellitus: Patient was transitioned to SSI in the hospital and had acceptable glycemic control throughout her stay. She was discharged on her home regimen.  Discharge Exam:   BP (!) 150/70 (BP Location: Right Arm)   Pulse 62   Temp 98.2 F (36.8 C) (Oral)   Resp 17   Ht '5\' 6"'$  (1.676 m)   Wt 111.1 kg   SpO2 97%   BMI 39.54 kg/m  Discharge exam:  General: Normal appearance, NAD HE: Normocephalic, atraumatic , EOMI, Conjunctivae normal ENT: No congestion, no rhinorrhea, no exudate or erythema Cardiovascular: Normal rate, regular rhythm. Grade II/IV holosystolic murmur heard at LUSB Pulmonary: Effort normal, breath sounds normal. No wheezes, rales, or rhonchi Abdominal: soft, nontender, bowel sounds present Musculoskeletal: no swelling, deformity, injury. Skin: Warm, dry, no bruising, erythema, or rash Neuro: AAO x 3. CN I-XII grossly intact. No focal deficits. Psychiatric/Behavioral: normal mood, normal behavior  Pertinent Labs, Studies, and Procedures:  CBC Latest Ref Rng & Units 11/05/2020 11/04/2020 11/03/2020  WBC 4.0 - 10.5 K/uL 13.1(H) 15.2(H) 12.8(H)  Hemoglobin 12.0 - 15.0 g/dL 7.6(L) 7.5(L) 7.8(L)  Hematocrit 36.0 - 46.0 % 26.2(L) 26.0(L) 25.9(L)  Platelets 150 - 400 K/uL 290 297 312    BMP Latest Ref Rng & Units 11/05/2020 11/04/2020 11/03/2020  Glucose 70 - 99 mg/dL 91 119(H) 133(H)  BUN 6 - 20 mg/dL 5(L) 6 9  Creatinine 0.44 - 1.00 mg/dL 0.71 0.78 0.82  BUN/Creat Ratio 9 - 23 - - -  Sodium 135 - 145 mmol/L  138 140 141  Potassium 3.5 - 5.1 mmol/L 3.8 3.6 3.4(L)  Chloride 98 - 111 mmol/L 105 108 107  CO2 22 - 32 mmol/L '25 25 23  '$ Calcium 8.9 - 10.3 mg/dL 8.9 9.1 8.7(L)     DG Lumbar Spine 2-3 Views  Result Date: 11/02/2020 CLINICAL DATA:  Recent fall with low back pain, initial encounter EXAM: LUMBAR SPINE - 3 VIEW COMPARISON:  11/08/2010 FINDINGS: Five lumbar type vertebral bodies are well visualized. Vertebral body height is well maintained. No anterolisthesis is noted. Mild disc space narrowing is noted at L4-5. Mild osteophytic changes are seen.  Aortic calcifications are noted without aneurysmal dilatation. Coils are noted in the left upper quadrant. IMPRESSION: Mild degenerative change without acute abnormality. Electronically Signed   By: Inez Catalina M.D.   On: 11/02/2020 09:34   CT Angio Abd/Pel W and/or Wo Contrast  Result Date: 11/01/2020 CLINICAL DATA:  Abdominal pain, nausea, vomiting, rectal bleeding EXAM: CTA ABDOMEN AND PELVIS WITHOUT AND WITH CONTRAST TECHNIQUE: Multidetector CT imaging of the abdomen and pelvis was performed using the standard protocol during bolus administration of intravenous contrast. Multiplanar reconstructed images and MIPs were obtained and reviewed to evaluate the vascular anatomy. CONTRAST:  122m OMNIPAQUE IOHEXOL 350 MG/ML SOLN COMPARISON:  CT 09/14/2020 FINDINGS: VASCULAR Aorta: Mild calcified plaque.  No aneurysm, dissection, or stenosis. Celiac: Patent without evidence of aneurysm, dissection, vasculitis or significant stenosis. SMA: Replaced right arterial supply, an anatomic variant. No aneurysm or stenosis. Renals: Single left, with calcified ostial plaque resulting in short segment stenosis of at least mild severity. Single right, widely patent. IMA: Patent without evidence of aneurysm, dissection, vasculitis or significant stenosis. Inflow: Calcified nonocclusive plaque through bilateral common iliac arteries and in the proximal internal iliac arteries. The  external iliac arteries are unremarkable. Proximal Outflow: Mildly atheromatous, patent Veins: Patent patent veins, portal vein, SM V, splenic vein, bilateral renal veins, IVC, iliac venous system. No venous pathology identified. Review of the MIP images confirms the above findings. NON-VASCULAR Lower chest: Linear scarring or subsegmental atelectasis posteriorly at the lung bases. No pleural or pericardial effusion. Hepatobiliary: 7 mm calcification near the gallbladder neck. The gallbladder is nondistended. No focal liver lesion or biliary ductal dilatation. Pancreas: Unremarkable. No pancreatic ductal dilatation or surrounding inflammatory changes. Spleen: Normal in size without focal abnormality. Adrenals/Urinary Tract: Adrenal glands unremarkable. 1.7 cm probable cyst, right upper pole kidney. No hydronephrosis. Urinary bladder incompletely distended. Stomach/Bowel: Stomach is distended by ingested material. Surgical clips along the lesser and greater curvature of the stomach. Hyperdense regions along the posterior wall in the gastric body on the arterial phase scan, without definite accumulation or persistence on the delayed scans to suggest active bleeding. The small bowel is decompressed. Normal appendix. The colon is nondilated, unremarkable. No definite active GI bleed. Lymphatic: No abdominal or pelvic adenopathy. Reproductive: Status post hysterectomy. No adnexal masses. Other: No ascites.  No free air. Musculoskeletal: Coarse calcifications in the anterior pelvic body wall presumably postoperative. Small paraumbilical hernia containing only mesenteric fat. No fracture or worrisome bone lesion. IMPRESSION: 1. Enhancement in the posterior mucosa of the stomach without convincing CT evidence of active hemorrhage. Correlate with endoscopic findings. 2. No evidence of acute GI bleed. 3. Aortoiliac  atherosclerosis (ICD10-170.0). Electronically Signed   By: DLucrezia EuropeM.D.   On: 11/01/2020 09:18      Discharge Instructions: Discharge Instructions     Call MD for:  difficulty breathing, headache or visual disturbances   Complete by: As directed    Call MD for:  persistant dizziness or light-headedness   Complete by: As directed        Signed: BOrvis Brill MD 11/05/2020, 11:21 AM   Pager: [phone]

## 2020-11-05 NOTE — Progress Notes (Signed)
Subjective: No reported blood in stool. No abdominal pain.  Objective: Vital signs in last 24 hours: Temp:  [98 F (36.7 C)-98.4 F (36.9 C)] 98.2 F (36.8 C) (07/24 0425) Pulse Rate:  [61-65] 62 (07/24 0735) Resp:  [15-17] 17 (07/24 0735) BP: (136-150)/(61-70) 150/70 (07/24 0735) SpO2:  [96 %-100 %] 97 % (07/24 0735) Weight change:  Last BM Date: 11/04/20  PE: GEN:  NAD, overweight, older-appearing than stated age  Lab Results: CBC    Component Value Date/Time   WBC 13.1 (H) 11/05/2020 0335   RBC 2.92 (L) 11/05/2020 0335   HGB 7.6 (L) 11/05/2020 0335   HGB 6.4 (LL) 10/20/2020 1234   HGB 8.6 (L) 06/14/2020 1626   HGB 8.1 (L) 02/05/2017 1407   HCT 26.2 (L) 11/05/2020 0335   HCT 28.2 (L) 06/14/2020 1626   HCT 26.6 (L) 02/05/2017 1407   PLT 290 11/05/2020 0335   PLT 295 10/20/2020 1234   PLT 353 06/14/2020 1626   MCV 89.7 11/05/2020 0335   MCV 89 06/14/2020 1626   MCV 82.8 02/05/2017 1407   MCH 26.0 11/05/2020 0335   MCHC 29.0 (L) 11/05/2020 0335   RDW 21.2 (H) 11/05/2020 0335   RDW 18.9 (H) 06/14/2020 1626   RDW 22.6 (H) 02/05/2017 1407   LYMPHSABS 1.2 11/01/2020 1844   LYMPHSABS 2.5 05/20/2017 1551   LYMPHSABS 2.6 02/05/2017 1407   MONOABS 0.7 11/01/2020 1844   MONOABS 0.5 02/05/2017 1407   EOSABS 0.1 11/01/2020 1844   EOSABS 0.1 05/20/2017 1551   BASOSABS 0.0 11/01/2020 1844   BASOSABS 0.0 05/20/2017 1551   BASOSABS 0.1 02/05/2017 1407  CMP     Component Value Date/Time   NA 138 11/05/2020 0335   NA 147 (H) 04/02/2017 1708   NA 141 02/05/2017 1407   K 3.8 11/05/2020 0335   K 3.5 02/05/2017 1407   CL 105 11/05/2020 0335   CO2 25 11/05/2020 0335   CO2 24 02/05/2017 1407   GLUCOSE 91 11/05/2020 0335   GLUCOSE 134 02/05/2017 1407   BUN 5 (L) 11/05/2020 0335   BUN 5 (L) 04/02/2017 1708   BUN 9.3 02/05/2017 1407   CREATININE 0.71 11/05/2020 0335   CREATININE 0.86 10/12/2020 1405   CREATININE 0.9 02/05/2017 1407   CALCIUM 8.9 11/05/2020 0335    CALCIUM 8.9 02/05/2017 1407   PROT 5.2 (L) 11/01/2020 0622   PROT 6.7 04/02/2017 1708   PROT 7.1 02/05/2017 1407   ALBUMIN 2.4 (L) 11/01/2020 0622   ALBUMIN 3.8 04/02/2017 1708   ALBUMIN 3.5 02/05/2017 1407   AST 16 11/01/2020 0622   AST 12 (L) 10/12/2020 1405   AST 13 02/05/2017 1407   ALT 10 11/01/2020 0622   ALT <6 10/12/2020 1405   ALT 8 02/05/2017 1407   ALKPHOS 48 11/01/2020 0622   ALKPHOS 84 02/05/2017 1407   BILITOT 0.5 11/01/2020 0622   BILITOT <0.2 (L) 10/12/2020 1405   BILITOT 0.23 02/05/2017 1407   GFRNONAA >60 11/05/2020 0335   GFRNONAA >60 10/12/2020 1405   GFRAA >60 01/06/2020 0921   Assessment:   Recurrent anemia.  Hgb stable past couple days Melena, seemingly resolved. History AVMs.  EGD yesterday with oozing gastric ulcer, endoscopic therapy applied.  Plan:   OK to discharge patient home from GI perspective. We will work to make outpatient follow-up with Dr. Michail Sermon, patient's outpatient gastroenterologist. [facility] will sign-off; please call with questions; thank you for the consultation.   Landry Dyke 11/05/2020, 12:22 PM   Cell  [phone] If no answer or after 5 PM call [phone]

## 2020-11-05 NOTE — Progress Notes (Signed)
Pt sit up in chair today & tolerated well, on room air. Pt given discharge instructions & taken out to car via w/c accompanied by NT & son.

## 2020-11-05 NOTE — Discharge Instructions (Addendum)
You were admitted to the hospital for blood loss from your nosebleeds and from an ulcer in the stomach that the GI team treated during your endoscopy. You received 2 blood transfusions, and your hemoglobin levels have been stable. We ask that you resume your blood pressure medications (amlodipine and hydrochlorothiazide) tomorrow. Resume your other medications as listed in this document. We recommend that you follow-up with Dr. Burr Medico and your primary care doctor next week if possible. You will also follow-up with the GI team - they will reach out to you to arrange this.

## 2020-11-06 ENCOUNTER — Telehealth: Payer: Self-pay

## 2020-11-06 ENCOUNTER — Telehealth: Payer: Self-pay | Admitting: Internal Medicine

## 2020-11-06 DIAGNOSIS — N3941 Urge incontinence: Secondary | ICD-10-CM | POA: Diagnosis not present

## 2020-11-06 DIAGNOSIS — N319 Neuromuscular dysfunction of bladder, unspecified: Secondary | ICD-10-CM | POA: Diagnosis not present

## 2020-11-06 NOTE — Telephone Encounter (Signed)
-----   Message from Mike Craze, DO sent at 11/05/2020 10:45 AM EDT ----- Please schedule patient for hospital follow-up visit in 1 week, thanks

## 2020-11-06 NOTE — Telephone Encounter (Signed)
Transition Care Management Unsuccessful Follow-up Telephone Call  Date of discharge and from where:  11/05/2020-[facility] ED  Attempts:  1st Attempt  Reason for unsuccessful TCM follow-up call:  Unable to reach patient

## 2020-11-06 NOTE — Telephone Encounter (Signed)
TOC HFU appointment 11/14/2020 at 2:45 pm with Dr. Marianna Payment.  Spoke with via telephone this am and agreed to appointment date and time.  Appointment card mailed to patient also.

## 2020-11-07 ENCOUNTER — Encounter (HOSPITAL_COMMUNITY): Payer: Self-pay | Admitting: Gastroenterology

## 2020-11-07 NOTE — Telephone Encounter (Signed)
Transition Care Management Follow-up Telephone Call Date of discharge and from where: 11/05/2020-[place]  How have you been since you were released from the hospital? Patient stated she is doing fine.  Any questions or concerns? No  Items Reviewed: Did the pt receive and understand the discharge instructions provided? Yes  Medications obtained and verified? Yes  Other? No  Any new allergies since your discharge? No  Dietary orders reviewed? N/A Do you have support at home? Yes   Home Care and Equipment/Supplies: Were home health services ordered? not applicable If so, what is the name of the agency? N/A Has the agency set up a time to come to the patient's home? not applicable Were any new equipment or medical supplies ordered?  No What is the name of the medical supply agency? N/A Were you able to get the supplies/equipment? not applicable Do you have any questions related to the use of the equipment or supplies? No  Functional Questionnaire: (I = Independent and D = Dependent) ADLs: I  Bathing/Dressing- I  Meal Prep- I  Eating- I  Maintaining continence- I  Transferring/Ambulation- I  Managing Meds- I  Follow up appointments reviewed:  PCP Hospital f/u appt confirmed? Yes  Scheduled to see Dr. Marianna Payment on 11/14/2020 @ 2:45 pm. [facility] f/u appt confirmed? No   Are transportation arrangements needed? No  If their condition worsens, is the pt aware to call PCP or go to the Emergency Dept.? Yes Was the patient provided with contact information for the PCP's office or ED? Yes Was to pt encouraged to call back with questions or concerns? Yes

## 2020-11-09 ENCOUNTER — Inpatient Hospital Stay (HOSPITAL_BASED_OUTPATIENT_CLINIC_OR_DEPARTMENT_OTHER): Payer: Medicaid Other | Admitting: Hematology

## 2020-11-09 ENCOUNTER — Encounter: Payer: Self-pay | Admitting: Hematology

## 2020-11-09 ENCOUNTER — Inpatient Hospital Stay: Payer: Medicaid Other

## 2020-11-09 ENCOUNTER — Other Ambulatory Visit: Payer: Self-pay

## 2020-11-09 VITALS — BP 155/76 | HR 51 | Temp 98.6°F | Resp 16

## 2020-11-09 DIAGNOSIS — Z95828 Presence of other vascular implants and grafts: Secondary | ICD-10-CM

## 2020-11-09 DIAGNOSIS — Z79899 Other long term (current) drug therapy: Secondary | ICD-10-CM | POA: Diagnosis not present

## 2020-11-09 DIAGNOSIS — D500 Iron deficiency anemia secondary to blood loss (chronic): Secondary | ICD-10-CM | POA: Diagnosis not present

## 2020-11-09 DIAGNOSIS — I780 Hereditary hemorrhagic telangiectasia: Secondary | ICD-10-CM | POA: Diagnosis not present

## 2020-11-09 DIAGNOSIS — Z5112 Encounter for antineoplastic immunotherapy: Secondary | ICD-10-CM | POA: Diagnosis not present

## 2020-11-09 DIAGNOSIS — K922 Gastrointestinal hemorrhage, unspecified: Secondary | ICD-10-CM | POA: Diagnosis not present

## 2020-11-09 DIAGNOSIS — R808 Other proteinuria: Secondary | ICD-10-CM

## 2020-11-09 LAB — TOTAL PROTEIN, URINE DIPSTICK: Protein, ur: 30 mg/dL — AB

## 2020-11-09 LAB — CBC WITH DIFFERENTIAL (CANCER CENTER ONLY)
Abs Immature Granulocytes: 0.05 10*3/uL (ref 0.00–0.07)
Basophils Absolute: 0 10*3/uL (ref 0.0–0.1)
Basophils Relative: 0 %
Eosinophils Absolute: 0.2 10*3/uL (ref 0.0–0.5)
Eosinophils Relative: 2 %
HCT: 28.2 % — ABNORMAL LOW (ref 36.0–46.0)
Hemoglobin: 8.2 g/dL — ABNORMAL LOW (ref 12.0–15.0)
Immature Granulocytes: 0 %
Lymphocytes Relative: 11 %
Lymphs Abs: 1.4 10*3/uL (ref 0.7–4.0)
MCH: 25.6 pg — ABNORMAL LOW (ref 26.0–34.0)
MCHC: 29.1 g/dL — ABNORMAL LOW (ref 30.0–36.0)
MCV: 88.1 fL (ref 80.0–100.0)
Monocytes Absolute: 0.6 10*3/uL (ref 0.1–1.0)
Monocytes Relative: 5 %
Neutro Abs: 10.3 10*3/uL — ABNORMAL HIGH (ref 1.7–7.7)
Neutrophils Relative %: 82 %
Platelet Count: 505 10*3/uL — ABNORMAL HIGH (ref 150–400)
RBC: 3.2 MIL/uL — ABNORMAL LOW (ref 3.87–5.11)
RDW: 20.8 % — ABNORMAL HIGH (ref 11.5–15.5)
WBC Count: 12.5 10*3/uL — ABNORMAL HIGH (ref 4.0–10.5)
nRBC: 0 % (ref 0.0–0.2)

## 2020-11-09 LAB — SAMPLE TO BLOOD BANK

## 2020-11-09 LAB — IRON AND TIBC
Iron: 30 ug/dL — ABNORMAL LOW (ref 41–142)
Saturation Ratios: 14 % — ABNORMAL LOW (ref 21–57)
TIBC: 219 ug/dL — ABNORMAL LOW (ref 236–444)
UIBC: 189 ug/dL (ref 120–384)

## 2020-11-09 LAB — FERRITIN: Ferritin: 157 ng/mL (ref 11–307)

## 2020-11-09 MED ORDER — SODIUM CHLORIDE 0.9 % IV SOLN
250.0000 mg | Freq: Once | INTRAVENOUS | Status: AC
  Administered 2020-11-09: 250 mg via INTRAVENOUS
  Filled 2020-11-09: qty 20

## 2020-11-09 MED ORDER — SODIUM CHLORIDE 0.9% FLUSH
10.0000 mL | Freq: Once | INTRAVENOUS | Status: AC
  Administered 2020-11-09: 10 mL
  Filled 2020-11-09: qty 10

## 2020-11-09 MED ORDER — SODIUM CHLORIDE 0.9 % IV SOLN
Freq: Once | INTRAVENOUS | Status: DC
  Filled 2020-11-09: qty 250

## 2020-11-09 MED ORDER — SODIUM CHLORIDE 0.9 % IV SOLN
INTRAVENOUS | Status: DC
  Filled 2020-11-09: qty 250

## 2020-11-09 MED ORDER — SODIUM CHLORIDE 0.9% FLUSH
10.0000 mL | INTRAVENOUS | Status: DC | PRN
  Filled 2020-11-09: qty 10

## 2020-11-09 MED ORDER — HEPARIN SOD (PORK) LOCK FLUSH 100 UNIT/ML IV SOLN
500.0000 [IU] | Freq: Once | INTRAVENOUS | Status: DC | PRN
  Filled 2020-11-09: qty 5

## 2020-11-09 MED ORDER — HEPARIN SOD (PORK) LOCK FLUSH 100 UNIT/ML IV SOLN
500.0000 [IU] | Freq: Once | INTRAVENOUS | Status: AC
  Administered 2020-11-09: 500 [IU]
  Filled 2020-11-09: qty 5

## 2020-11-09 MED ORDER — SODIUM CHLORIDE 0.9 % IV SOLN
5.0000 mg/kg | Freq: Once | INTRAVENOUS | Status: AC
  Administered 2020-11-09: 600 mg via INTRAVENOUS
  Filled 2020-11-09: qty 16

## 2020-11-09 NOTE — Patient Instructions (Signed)
Sodium Ferric Gluconate Complex injection What is this medicine? SODIUM FERRIC GLUCONATE COMPLEX (SOE dee um FER ik GLOO koe nate KOM pleks) is an iron replacement. It is used with epoetin therapy to treat low iron levels in patients who are receiving hemodialysis. This medicine may be used for other purposes; ask your health care provider or pharmacist if you have questions. COMMON BRAND NAME(S): Ferrlecit, Nulecit What should I tell my health care provider before I take this medicine? They need to know if you have any of the following conditions:  anemia that is not from iron deficiency  high levels of iron in the body  an unusual or allergic reaction to iron, benzyl alcohol, other medicines, foods, dyes, or preservatives  pregnant or are trying to become pregnant  breast-feeding How should I use this medicine? This medicine is for infusion into a vein. It is given by a health care professional in a hospital or clinic setting. Talk to your pediatrician regarding the use of this medicine in children. While this drug may be prescribed for children as young as 6 years old for selected conditions, precautions do apply. Overdosage: If you think you have taken too much of this medicine contact a poison control center or emergency room at once. NOTE: This medicine is only for you. Do not share this medicine with others. What if I miss a dose? It is important not to miss your dose. Call your doctor or health care professional if you are unable to keep an appointment. What may interact with this medicine? Do not take this medicine with any of the following medications:  deferoxamine  dimercaprol  other iron products This medicine may also interact with the following medications:  chloramphenicol  deferasirox  medicine for blood pressure like enalapril This list may not describe all possible interactions. Give your health care provider a list of all the medicines, herbs,  non-prescription drugs, or dietary supplements you use. Also tell them if you smoke, drink alcohol, or use illegal drugs. Some items may interact with your medicine. What should I watch for while using this medicine? Your condition will be monitored carefully while you are receiving this medicine. Visit your doctor for check-ups as directed. What side effects may I notice from receiving this medicine? Side effects that you should report to your doctor or health care professional as soon as possible:  allergic reactions like skin rash, itching or hives, swelling of the face, lips, or tongue  breathing problems  changes in hearing  changes in vision  chills, flushing, or sweating  fast, irregular heartbeat  feeling faint or lightheaded, falls  fever, flu-like symptoms  high or low blood pressure  pain, tingling, numbness in the hands or feet  severe pain in the chest, back, flanks, or groin  swelling of the ankles, feet, hands  trouble passing urine or change in the amount of urine  unusually weak or tired Side effects that usually do not require medical attention (report to your doctor or health care professional if they continue or are bothersome):  cramps  dark colored stools  diarrhea  headache  nausea, vomiting  stomach upset This list may not describe all possible side effects. Call your doctor for medical advice about side effects. You may report side effects to FDA at 1-800-FDA-1088. Where should I keep my medicine? This drug is given in a hospital or clinic and will not be stored at home. NOTE: This sheet is a summary. It may not cover all   possible information. If you have questions about this medicine, talk to your doctor, pharmacist, or health care provider.  2021 Elsevier/Gold Standard (2007-12-02 15:58:57)   

## 2020-11-09 NOTE — Progress Notes (Signed)
Peggy House   [phone] [phone]   Clinic Follow up Note   Patient Care Team: [name], MD as PCP - General (Internal Medicine)  Date of Service:  11/09/2020  CHIEF COMPLAINT: f/u of HHT and anemia  PREVIOUS THERAPY: -Multiple EGD, small bowel enteroscope, C-scope with APC -Oral and iv amicar were given during hospital stay, no effect  -IR embolization of of the left gastric and short gastric artery branch vessels without evidence of residual flow in the Dieulafoy lesion on 12/10/17 -Avastin on 8/2 and 8/30 at [facility]; starting 12/26/17 continue 5 mg/kg q2 weeks until 02/20/18, restarted on 04/03/2018  -Tamoxifen started in 10/2017 for Peggy House stopped 12/10/18 due to DVT  CURRENT THERAPY:  -Blood transfusion for severe anemia secondary to GI bleeding as needed  -iv ferric gluconate every 1-2 weeks as needed with ferritin goal 100-200. Increased to weekly on 07/29/19. Reduced to every 2 weeks on 11/12/19  -Restarted Avastin/Zirabev q2weeks on 04/03/18. Reduced to every 4 weeks starting 09/25/19. Increased to every 2 weeks on 01/06/20. Increased dose to '250mg'$  on 01/20/20 -Amicar 1g bid starting 07/17/2018. Reduced to only as needed on 12/10/18 due to DVT. Restart daily on 06/16/20 due to increased bleeding. Increased to BID, then TID on 07/07/20.  INTERVAL HISTORY:  Peggy House is here for a follow up of HHT and anemia. She was last seen by me on 10/12/20 in the office and while in the hospital. She was evaluated in the infusion area. She was recently hospitalized for syncope episode, likely due to severe anemia and a GI bleeding.  She developed a severe pain in the back after the fall.  She reports she is feeling better since discharge. She notes continued leg swelling.  All other systems were reviewed with the patient and are negative.  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    SURGICAL HISTORY: Past Surgical History:  Procedure Laterality Date   ABDOMINAL HYSTERECTOMY     CARPAL TUNNEL RELEASE  05/13/2011   Procedure: CARPAL TUNNEL RELEASE;  Surgeon: Nita Sells, MD;  Location: [place];  Service: Orthopedics;  Laterality: Left;   COLONOSCOPY N/A 03/02/2020   Procedure: COLONOSCOPY;  Surgeon: Ronald Lobo, MD;  Location: [facility] ENDOSCOPY;  Service: Endoscopy;  Laterality: N/A;   COLONOSCOPY WITH PROPOFOL N/A 04/28/2014   Procedure: COLONOSCOPY WITH PROPOFOL;  Surgeon: Cleotis Nipper, MD;  Location: [facility] ENDOSCOPY;  Service: Endoscopy;  Laterality: N/A;   DG TOES*L*  2/10   rt   DILATION AND CURETTAGE OF UTERUS     ENTEROSCOPY N/A 10/17/2017   Procedure: ENTEROSCOPY;  Surgeon: Otis Brace, MD;  Location: [place];  Service: Gastroenterology;  Laterality: N/A;   ESOPHAGOGASTRODUODENOSCOPY N/A 04/10/2014   Procedure: ESOPHAGOGASTRODUODENOSCOPY (EGD);  Surgeon: [name], MD;  Location: [facility] ENDOSCOPY;  Service: Endoscopy;  Laterality: N/A;   ESOPHAGOGASTRODUODENOSCOPY N/A 05/10/2017   Procedure: ESOPHAGOGASTRODUODENOSCOPY (EGD);  Surgeon: Ronald Lobo, MD;  Location: [facility] ENDOSCOPY;  Service: Endoscopy;  Laterality: N/A;   ESOPHAGOGASTRODUODENOSCOPY N/A 09/22/2017   Procedure: ESOPHAGOGASTRODUODENOSCOPY (EGD);  Surgeon:  Clarene Essex, MD;  Location: [place];  Service: Endoscopy;  Laterality: N/A;  bedside   ESOPHAGOGASTRODUODENOSCOPY N/A 03/02/2020   Procedure: ESOPHAGOGASTRODUODENOSCOPY (EGD);  Surgeon: Ronald Lobo, MD;  Location: [facility];  Service: Endoscopy;  Laterality: N/A;   ESOPHAGOGASTRODUODENOSCOPY N/A  11/03/2020   Procedure: ESOPHAGOGASTRODUODENOSCOPY (EGD);  Surgeon: Wilford Corner, MD;  Location: [place];  Service: Endoscopy;  Laterality: N/A;   ESOPHAGOGASTRODUODENOSCOPY (EGD) WITH PROPOFOL N/A 04/27/2014   Procedure: ESOPHAGOGASTRODUODENOSCOPY (EGD) WITH PROPOFOL;  Surgeon: Cleotis Nipper, MD;  Location: [place];  Service: Endoscopy;  Laterality: N/A;  possible apc   ESOPHAGOGASTRODUODENOSCOPY (EGD) WITH PROPOFOL N/A 09/30/2017   Procedure: ESOPHAGOGASTRODUODENOSCOPY (EGD) WITH PROPOFOL;  Surgeon: [name], MD;  Location: [place];  Service: Gastroenterology;  Laterality: N/A;   ESOPHAGOGASTRODUODENOSCOPY (EGD) WITH PROPOFOL N/A 10/01/2017   Procedure: ESOPHAGOGASTRODUODENOSCOPY (EGD) WITH PROPOFOL;  Surgeon: [name], MD;  Location: [place];  Service: Gastroenterology;  Laterality: N/A;   ESOPHAGOGASTRODUODENOSCOPY (EGD) WITH PROPOFOL N/A 10/08/2017   Procedure: ESOPHAGOGASTRODUODENOSCOPY (EGD) WITH PROPOFOL;  Surgeon: Otis Brace, MD;  Location: [place];  Service: Gastroenterology;  Laterality: N/A;   ESOPHAGOGASTRODUODENOSCOPY (EGD) WITH PROPOFOL N/A 10/17/2017   Procedure: ESOPHAGOGASTRODUODENOSCOPY (EGD) WITH PROPOFOL;  Surgeon: Otis Brace, MD;  Location: [place];  Service: Gastroenterology;  Laterality: N/A;   ESOPHAGOGASTRODUODENOSCOPY (EGD) WITH PROPOFOL N/A 10/19/2017   Procedure: ESOPHAGOGASTRODUODENOSCOPY (EGD) WITH PROPOFOL;  Surgeon: Otis Brace, MD;  Location: [place];  Service: Gastroenterology;  Laterality: N/A;   ESOPHAGOGASTRODUODENOSCOPY (EGD) WITH PROPOFOL N/A 12/04/2018   Procedure: ESOPHAGOGASTRODUODENOSCOPY (EGD) WITH PROPOFOL;  Surgeon: Wilford Corner, MD;  Location: [facility] ENDOSCOPY;  Service: Endoscopy;  Laterality: N/A;   GIVENS CAPSULE STUDY N/A 10/02/2017   Procedure: GIVENS CAPSULE STUDY;  Surgeon: [name], MD;  Location: [place];  Service: Gastroenterology;  Laterality: N/A;   GIVENS CAPSULE STUDY N/A  10/08/2017   Procedure: GIVENS CAPSULE STUDY;  Surgeon: Otis Brace, MD;  Location: [place];  Service: Gastroenterology;  Laterality: N/A;  endoscopic placement of capsule   GIVENS CAPSULE STUDY N/A 03/02/2020   Procedure: GIVENS CAPSULE STUDY;  Surgeon: Ronald Lobo, MD;  [facility];  Service: Endoscopy;  Laterality: N/A;   HEMOSTASIS CLIP PLACEMENT  12/04/2018   Procedure: HEMOSTASIS CLIP PLACEMENT;  Surgeon: Wilford Corner, MD;  [facility]OS[facility];  Service: Endoscopy;;   HOT HEMOSTASIS N/A 04/27/2014   Procedure: HOT HEMOSTASIS (ARGON PLASMA COAGULATION/BICAP);  Surgeon: Cleotis Nipper, MD;  Location: [facility] ENDOSCOPY;  Service: Endoscopy;  Laterality: N/A;   HOT HEMOSTASIS N/A 09/30/2017   Procedure: HOT HEMOSTASIS (ARGON PLASMA COAGULATION/BICAP);  Surgeon: [name], MD;  Location: [place];  Service: Gastroenterology;  Laterality: N/A;   HOT HEMOSTASIS N/A 10/01/2017   Procedure: HOT HEMOSTASIS (ARGON PLASMA COAGULATION/BICAP);  Surgeon: [name], MD;  Location: [place];  Service: Gastroenterology;  Laterality: N/A;   HOT HEMOSTASIS N/A 10/17/2017   Procedure: HOT HEMOSTASIS (ARGON PLASMA COAGULATION/BICAP);  Surgeon: Otis Brace, MD;  Location: [facility] ENDOSCOPY;  Service: Gastroenterology;  Laterality: N/A;   HOT HEMOSTASIS N/A 10/19/2017   Procedure: HOT HEMOSTASIS (ARGON PLASMA COAGULATION/BICAP);  Surgeon: Otis Brace, MD;  Location: [facility] ENDOSCOPY;  Service: Gastroenterology;  Laterality: N/A;   HOT HEMOSTASIS N/A 03/02/2020   Procedure: HOT HEMOSTASIS (ARGON PLASMA COAGULATION/BICAP);  Surgeon: Ronald Lobo, MD;  Location[facility];  Service: Endoscopy;  Laterality: N/A;   IR IMAGING GUIDED PORT INSERTION  07/08/2018   L shoulder Surgery  2011   POLYPECTOMY  03/02/2020   Procedure: POLYPECTOMY;  Surgeon: Ronald Lobo, MD;  [facility]OS[facility];  Service: Endoscopy;;   SCLEROTHERAPY  11/03/2020   Procedure: Clide Deutscher;   Surgeon: Wilford Corner, MD;  Location: [facility] ENDOSCOPY;  Service: Endoscopy;;   SUBMUCOSAL INJECTION  09/22/2017   Procedure: SUBMUCOSAL INJECTION;  Surgeon: Clarene Essex, MD;  Location: [place];  Service: Endoscopy;;   SUBMUCOSAL INJECTION  12/04/2018   Procedure: SUBMUCOSAL INJECTION;  Surgeon: Wilford Corner, MD;  [facility]: [facility] ENDOSCOPY;  Service: Endoscopy;;    I have reviewed the social history and family history with the patient and they are unchanged from previous note.  ALLERGIES:  is allergic to feraheme [ferumoxytol], nsaids, tomato, iron (ferrous sulfate) [ferrous sulfate er], other, and wasp venom.  MEDICATIONS:  Current Outpatient Medications  Medication Sig Dispense Refill   Accu-Chek Softclix Lancets lancets Use to check blood sugar before breakfast and before dinner while on steroids (Patient taking differently: 1 each by Other route See admin instructions. Use to check blood sugar before breakfast and before dinner while on steroids) 100 each 1   acitretin (SORIATANE) 10 MG capsule Take 10 mg by mouth daily.     albuterol (VENTOLIN HFA) 108 (90 Base) MCG/ACT inhaler Inhale 1-2 puffs into the lungs every 6 (six) hours as needed for wheezing or shortness of breath. 1 each 1   ALPRAZolam (XANAX) 0.5 MG tablet TAKE 1 TABLET(0.5 MG) BY MOUTH AT BEDTIME AS NEEDED FOR ANXIETY (Patient taking differently: Take 0.5 mg by mouth at bedtime as needed for anxiety.) 30 tablet 0   Aminocaproic Acid 1000 MG TABS Take 1 tablet (1,000 mg total) by mouth in the morning and at bedtime. TAKE 1 TABLET(1000 MG) BY MOUTH TWICE DAILY (Patient taking differently: Take 1,000 mg by mouth in the morning and at bedtime.) 60 tablet 3   amLODipine (NORVASC) 10 MG tablet TAKE 1 TABLET(10 MG) BY MOUTH DAILY 90 tablet 0   atorvastatin (LIPITOR) 80 MG tablet TAKE 1 TABLET(80 MG) BY MOUTH DAILY 30 tablet 3   diphenoxylate-atropine (LOMOTIL) 2.5-0.025 MG tablet 1 to 2 PO QID prn diarrhea 30 tablet 1    furosemide (LASIX) 40 MG tablet Take 40 mg by mouth daily.     glucose blood (ACCU-CHEK GUIDE) test strip Check blood sugar 2 times per day while on steroids before breakfast and dinner (Patient taking differently: 1 each by Other route See admin instructions. Check blood sugar 2 times per day while on steroids before breakfast and dinner) 50 each 3   hydrochlorothiazide (HYDRODIURIL) 12.5 MG tablet TAKE 1 TABLET(12.5 MG) BY MOUTH DAILY 90 tablet 1   lidocaine-prilocaine (EMLA) cream Apply 1 application topically as needed. 30 g 0   metFORMIN (GLUCOPHAGE) 500 MG tablet TAKE 1 TABLET(500 MG) BY MOUTH TWICE DAILY WITH A MEAL 180 tablet 3   methocarbamol (ROBAXIN) 500 MG tablet Take 1 tablet (500 mg total) by mouth every 8 (eight) hours as needed for muscle spasms (back pain/spasm). 7 tablet 0   metoprolol succinate (TOPROL-XL) 100 MG 24 hr tablet Take 100 mg by mouth daily.     pantoprazole (PROTONIX) 40 MG tablet TAKE 1 TABLET(40 MG) BY MOUTH TWICE DAILY 60 tablet 5   Podiatric Products (FLEXITOL HEEL BALM) OINT Apply 1 application topically as needed (dry skin).      potassium chloride (KLOR-CON) 20 MEQ packet Take 20 mEq by mouth daily. 30 packet 1   prochlorperazine (COMPAZINE) 10 MG tablet Take 1 tablet (10 mg total) by mouth every 6 (six) hours as needed for nausea or vomiting. 30 tablet 1  sertraline (ZOLOFT) 25 MG tablet Take 5 tablets (125 mg total) by mouth daily. 30 tablet 0   terbinafine (LAMISIL AT) 1 % cream Apply 1 application topically 2 (two) times daily. Both bottom and top of both feet and toes 36 g 0   traMADol (ULTRAM) 50 MG tablet Take 1 tablet (50 mg total) by mouth daily as needed for severe pain. 7 tablet 0   traZODone (DESYREL) 100 MG tablet TAKE 1 TABLET(100 MG) BY MOUTH AT BEDTIME 30 tablet 2   No current facility-administered medications for this visit.   Facility-Administered Medications Ordered in Other Visits  Medication Dose Route Frequency Provider Last Rate Last  Admin   0.9 %  sodium chloride infusion   Intravenous Continuous Truitt Merle, MD   Stopped at 11/09/20 1703   0.9 %  sodium chloride infusion   Intravenous Once Truitt Merle, MD       heparin lock flush 100 unit/mL  500 Units Intracatheter Once PRN Truitt Merle, MD       sodium chloride flush (NS) 0.9 % injection 10 mL  10 mL Intracatheter PRN Truitt Merle, MD   10 mL at 08/14/20 1725   sodium chloride flush (NS) 0.9 % injection 10 mL  10 mL Intracatheter PRN Truitt Merle, MD        PHYSICAL EXAMINATION: ECOG PERFORMANCE STATUS: 3 - Symptomatic, >50% confined to bed  There were no vitals filed for this visit.  There were no vitals filed for this visit.  GENERAL:alert, no distress and comfortable SKIN: skin color, texture, turgor are normal, no rashes or significant lesions EYES: normal, Conjunctiva are pink and non-injected, sclera clear  Musculoskeletal:no cyanosis of digits and no clubbing  NEURO: alert & oriented x 3 with fluent speech, no focal motor/sensory deficits  LABORATORY DATA:  I have reviewed the data as listed CBC Latest Ref Rng & Units 11/09/2020 11/05/2020 11/04/2020  WBC 4.0 - 10.5 K/uL 12.5(H) 13.1(H) 15.2(H)  Hemoglobin 12.0 - 15.0 g/dL 8.2(L) 7.6(L) 7.5(L)  Hematocrit 36.0 - 46.0 % 28.2(L) 26.2(L) 26.0(L)  Platelets 150 - 400 K/uL 505(H) 290 297     CMP Latest Ref Rng & Units 11/05/2020 11/04/2020 11/03/2020  Glucose 70 - 99 mg/dL 91 119(H) 133(H)  BUN 6 - 20 mg/dL 5(L) 6 9  Creatinine 0.44 - 1.00 mg/dL 0.71 0.78 0.82  Sodium 135 - 145 mmol/L 138 140 141  Potassium 3.5 - 5.1 mmol/L 3.8 3.6 3.4(L)  Chloride 98 - 111 mmol/L 105 108 107  CO2 22 - 32 mmol/L '25 25 23  '$ Calcium 8.9 - 10.3 mg/dL 8.9 9.1 8.7(L)  Total Protein 6.5 - 8.1 g/dL - - -  Total Bilirubin 0.3 - 1.2 mg/dL - - -  Alkaline Phos 38 - 126 U/L - - -  AST 15 - 41 U/L - - -  ALT 0 - 44 U/L - - -      RADIOGRAPHIC STUDIES: I have personally reviewed the radiological images as listed and agreed with the  findings in the report. No results found.   ASSESSMENT & PLAN:  JAYDYN KAEO is a 60 y.o. female with   1. 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  -She was admitted on 11/01/20 for symptomatic  anemia following a syncopal episode, as well as hematochezia. -Hgb 8.2 today (11/09/20). No need for blood transfusion. She will proceed with IV iron today and for next 6 weeks. -Patient sometime missed her appointment, I strongly encouraged her to keep her appointment weekly for labs and IV iron. -Follow-up in 4 weeks  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 -Pt was hospitalized several times from 1-10/2017 for severe recurrent GI bleeding from AVMs which required multiple blood transfusions and APCs. 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  -She is s/p IR embolization of the left Gastric and short gastric artery branch vessels without evidence of residual flow in the Dieulafoy lesion on 12/10/2017.  -She required cauterization for stomach for severe Gi bleeding in 11/2018, and epinephrine injection for bleeding ulcer on 11/03/20 -She has been on bevacizumab every 2-4 week, treatment has been intermittently held due to large proteinuria. Will check urine today when she is able to provide a sample.  3. B/l LE Edema -Stable. Continue compression socks and leg elevation  -[place] of leg found lymphadenopathy of b/l groin. She does have skin darkening of her legs which she has had in recent months.    4. Smoking  cessation -She was smoking 10 cigarettes a day on average before recent hospitalization. She is smoking about 1/2 ppd (as of 08/03/20) -I strongly encouraged her to stop smoking completely. She tried previously but had trouble.   5. HTN, uncontrolled, Recently diagnosed DM -Continue medications. Will monitor on Avastin.   -Continue to f/u with PCP     PLAN:  -Proceed with IV Ferric Gluconate '250mg'$  and beva oday -flush, labs and Ferric Gluconate '250mg'$  (unless Ferritin >200 on last lab) weekly x6 -f/u in 4 weeks   No problem-specific Assessment & Plan notes found for this encounter.   No orders of the defined types were placed in this encounter.  All questions were answered. The patient knows to call the clinic with any problems, questions or concerns. No barriers to learning was detected.      Truitt Merle, MD 11/09/2020   I, Wilburn Mylar, am acting as scribe for Truitt Merle, MD.   I have reviewed the above documentation for accuracy and completeness, and I agree with the above.

## 2020-11-10 ENCOUNTER — Encounter: Payer: Self-pay | Admitting: Hematology

## 2020-11-13 ENCOUNTER — Telehealth: Payer: Self-pay | Admitting: Hematology

## 2020-11-13 NOTE — Telephone Encounter (Signed)
Scheduled follow-up appointments per 7/28 los. Patient is aware. 

## 2020-11-14 ENCOUNTER — Encounter: Payer: Medicaid Other | Admitting: Internal Medicine

## 2020-11-14 ENCOUNTER — Other Ambulatory Visit: Payer: Self-pay | Admitting: Hematology

## 2020-11-14 NOTE — Progress Notes (Deleted)
   CC: anemia due to recurrent GI bleed, hospital f/u  HPI:  [name] is a 60 y.o.   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 ***  Assessment & Plan:   See Encounters Tab for problem based charting.  Patient {GC/GE:[id number]::"discussed with","seen with"} Dr. {NAMES:[id number]::"Guilloud","Hoffman","Mullen","Narendra","Williams","Vincent"}

## 2020-11-15 ENCOUNTER — Ambulatory Visit: Payer: Medicaid Other | Admitting: Behavioral Health

## 2020-11-15 DIAGNOSIS — F4321 Adjustment disorder with depressed mood: Secondary | ICD-10-CM

## 2020-11-15 DIAGNOSIS — F4320 Adjustment disorder, unspecified: Secondary | ICD-10-CM

## 2020-11-15 DIAGNOSIS — F331 Major depressive disorder, recurrent, moderate: Secondary | ICD-10-CM

## 2020-11-15 DIAGNOSIS — F419 Anxiety disorder, unspecified: Secondary | ICD-10-CM
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
## 2020-11-16 ENCOUNTER — Telehealth: Payer: Self-pay

## 2020-11-16 ENCOUNTER — Inpatient Hospital Stay: Payer: Medicaid Other | Attending: Hematology

## 2020-11-16 ENCOUNTER — Telehealth: Payer: Self-pay | Admitting: Hematology

## 2020-11-16 ENCOUNTER — Other Ambulatory Visit: Payer: Self-pay

## 2020-11-16 ENCOUNTER — Encounter: Payer: Medicaid Other | Admitting: Internal Medicine

## 2020-11-16 DIAGNOSIS — K922 Gastrointestinal hemorrhage, unspecified: Secondary | ICD-10-CM | POA: Diagnosis not present

## 2020-11-16 DIAGNOSIS — I780 Hereditary hemorrhagic telangiectasia: Secondary | ICD-10-CM | POA: Diagnosis not present

## 2020-11-16 DIAGNOSIS — Z95828 Presence of other vascular implants and grafts: Secondary | ICD-10-CM

## 2020-11-16 DIAGNOSIS — D500 Iron deficiency anemia secondary to blood loss (chronic): Secondary | ICD-10-CM

## 2020-11-16 DIAGNOSIS — Z79899 Other long term (current) drug therapy: Secondary | ICD-10-CM | POA: Insufficient documentation

## 2020-11-16 DIAGNOSIS — Z5112 Encounter for antineoplastic immunotherapy: Secondary | ICD-10-CM | POA: Insufficient documentation

## 2020-11-16 LAB — CBC WITH DIFFERENTIAL (CANCER CENTER ONLY)
Abs Immature Granulocytes: 0.04 10*3/uL (ref 0.00–0.07)
Basophils Absolute: 0.1 10*3/uL (ref 0.0–0.1)
Basophils Relative: 1 %
Eosinophils Absolute: 0.1 10*3/uL (ref 0.0–0.5)
Eosinophils Relative: 1 %
HCT: 22.8 % — ABNORMAL LOW (ref 36.0–46.0)
Hemoglobin: 6.7 g/dL — CL (ref 12.0–15.0)
Immature Granulocytes: 0 %
Lymphocytes Relative: 14 %
Lymphs Abs: 1.7 10*3/uL (ref 0.7–4.0)
MCH: 26.4 pg (ref 26.0–34.0)
MCHC: 29.4 g/dL — ABNORMAL LOW (ref 30.0–36.0)
MCV: 89.8 fL (ref 80.0–100.0)
Monocytes Absolute: 0.6 10*3/uL (ref 0.1–1.0)
Monocytes Relative: 4 %
Neutro Abs: 10.2 10*3/uL — ABNORMAL HIGH (ref 1.7–7.7)
Neutrophils Relative %: 80 %
Platelet Count: 378 10*3/uL (ref 150–400)
RBC: 2.54 MIL/uL — ABNORMAL LOW (ref 3.87–5.11)
RDW: 21.9 % — ABNORMAL HIGH (ref 11.5–15.5)
WBC Count: 12.7 10*3/uL — ABNORMAL HIGH (ref 4.0–10.5)
nRBC: 0.2 % (ref 0.0–0.2)

## 2020-11-16 MED ORDER — SODIUM CHLORIDE 0.9% FLUSH
10.0000 mL | Freq: Once | INTRAVENOUS | Status: AC
  Administered 2020-11-16: 10 mL
  Filled 2020-11-16: qty 10

## 2020-11-16 MED ORDER — HEPARIN SOD (PORK) LOCK FLUSH 100 UNIT/ML IV SOLN
500.0000 [IU] | Freq: Once | INTRAVENOUS | Status: AC
  Administered 2020-11-16: 500 [IU]
  Filled 2020-11-16: qty 5

## 2020-11-16 NOTE — Telephone Encounter (Signed)
Left message with follow-up appointment per 8/2 staff message.

## 2020-11-16 NOTE — Telephone Encounter (Signed)
CRITICAL VALUE STICKER  CRITICAL VALUE: HGB 6.5  RECEIVER (on-site recipient of call): [place] NOTIFIED: 8/4 4:00pm  MESSENGER (representative from lab): Rosann Auerbach  MD NOTIFIED: Dr. Burr Medico    RESPONSE:  Send sample to blood bank, type and screen and transfusion for tomorrow or Saturday 11/18/20.  This nurse acknowledged.

## 2020-11-17 MED FILL — Sod Ferric Gluc Cmplx in Sucrose IV Soln 12.5 MG/ML (Fe Eq): INTRAVENOUS | Qty: 20 | Status: AC

## 2020-11-18 ENCOUNTER — Inpatient Hospital Stay: Payer: Medicaid Other

## 2020-11-18 ENCOUNTER — Other Ambulatory Visit: Payer: Self-pay

## 2020-11-18 ENCOUNTER — Other Ambulatory Visit: Payer: Self-pay | Admitting: *Deleted

## 2020-11-18 VITALS — BP 148/73 | HR 52 | Temp 98.2°F | Resp 18

## 2020-11-18 DIAGNOSIS — Z5112 Encounter for antineoplastic immunotherapy: Secondary | ICD-10-CM | POA: Diagnosis not present

## 2020-11-18 DIAGNOSIS — Z95828 Presence of other vascular implants and grafts: Secondary | ICD-10-CM

## 2020-11-18 DIAGNOSIS — I780 Hereditary hemorrhagic telangiectasia: Secondary | ICD-10-CM

## 2020-11-18 DIAGNOSIS — D500 Iron deficiency anemia secondary to blood loss (chronic): Secondary | ICD-10-CM

## 2020-11-18 LAB — SAMPLE TO BLOOD BANK

## 2020-11-18 LAB — PREPARE RBC (CROSSMATCH)

## 2020-11-18 MED ORDER — DIPHENHYDRAMINE HCL 25 MG PO CAPS
25.0000 mg | ORAL_CAPSULE | Freq: Once | ORAL | Status: AC
  Administered 2020-11-18: 25 mg via ORAL

## 2020-11-18 MED ORDER — SODIUM CHLORIDE 0.9% IV SOLUTION
250.0000 mL | Freq: Once | INTRAVENOUS | Status: DC
  Filled 2020-11-18: qty 250

## 2020-11-18 MED ORDER — SODIUM CHLORIDE 0.9 % IV SOLN
250.0000 mg | Freq: Once | INTRAVENOUS | Status: AC
  Administered 2020-11-18: 250 mg via INTRAVENOUS

## 2020-11-18 MED ORDER — SODIUM CHLORIDE 0.9% FLUSH
10.0000 mL | Freq: Once | INTRAVENOUS | Status: AC
  Administered 2020-11-18: 10 mL
  Filled 2020-11-18: qty 10

## 2020-11-18 MED ORDER — HEPARIN SOD (PORK) LOCK FLUSH 100 UNIT/ML IV SOLN
500.0000 [IU] | Freq: Once | INTRAVENOUS | Status: AC
Start: 2020-11-18 — End: 2020-11-18
  Administered 2020-11-18: 500 [IU]
  Filled 2020-11-18: qty 5

## 2020-11-18 MED ORDER — ACETAMINOPHEN 325 MG PO TABS
650.0000 mg | ORAL_TABLET | Freq: Once | ORAL | Status: AC
  Administered 2020-11-18: 650 mg via ORAL

## 2020-11-18 MED ORDER — DIPHENHYDRAMINE HCL 25 MG PO CAPS
ORAL_CAPSULE | ORAL | Status: AC
  Filled 2020-11-18: qty 1

## 2020-11-18 MED ORDER — SODIUM CHLORIDE 0.9% FLUSH
10.0000 mL | INTRAVENOUS | Status: AC | PRN
  Administered 2020-11-18: 10 mL
  Filled 2020-11-18: qty 10

## 2020-11-18 MED ORDER — ACETAMINOPHEN 325 MG PO TABS
ORAL_TABLET | ORAL | Status: AC
  Filled 2020-11-18: qty 2

## 2020-11-18 NOTE — Patient Instructions (Signed)
Blood Transfusion, Adult, Care After This sheet gives you information about how to care for yourself after your procedure. Your doctor may also give you more specific instructions. If youhave problems or questions, contact your doctor. What can I expect after the procedure? After the procedure, it is common to have: Bruising and soreness at the IV site. A fever or chills on the day of the procedure. This may be your body's response to the new blood cells received. A headache. Follow these instructions at home: Insertion site care     Follow instructions from your doctor about how to take care of your insertion site. This is where an IV tube was put into your vein. Make sure you: Wash your hands with soap and water before and after you change your bandage (dressing). If you cannot use soap and water, use hand sanitizer. Change your bandage as told by your doctor. Check your insertion site every day for signs of infection. Check for: Redness, swelling, or pain. Bleeding from the site. Warmth. Pus or a bad smell. General instructions Take over-the-counter and prescription medicines only as told by your doctor. Rest as told by your doctor. Go back to your normal activities as told by your doctor. Keep all follow-up visits as told by your doctor. This is important. Contact a doctor if: You have itching or red, swollen areas of skin (hives). You feel worried or nervous (anxious). You feel weak after doing your normal activities. You have redness, swelling, warmth, or pain around the insertion site. You have blood coming from the insertion site, and the blood does not stop with pressure. You have pus or a bad smell coming from the insertion site. Get help right away if: You have signs of a serious reaction. This may be coming from an allergy or the body's defense system (immune system). Signs include: Trouble breathing or shortness of breath. Swelling of the face or feeling warm  (flushed). Fever or chills. Head, chest, or back pain. Dark pee (urine) or blood in the pee. Widespread rash. Fast heartbeat. Feeling dizzy or light-headed. You may receive your blood transfusion in an outpatient setting. If so, youwill be told whom to contact to report any reactions. These symptoms may be an emergency. Do not wait to see if the symptoms will go away. Get medical help right away. Call your local emergency services (911 in the [place] Do not drive yourself to the hospital. Summary Bruising and soreness at the IV site are common. Check your insertion site every day for signs of infection. Rest as told by your doctor. Go back to your normal activities as told by your doctor. Get help right away if you have signs of a serious reaction. This information is not intended to replace advice given to you by your health care provider. Make sure you discuss any questions you have with your healthcare provider. Document Revised: 09/24/2018 Document Reviewed: 09/24/2018 Elsevier Patient Education  2022 [employer]  

## 2020-11-19 LAB — TYPE AND SCREEN
ABO/RH(D): A NEG
Antibody Screen: NEGATIVE
Unit division: 0

## 2020-11-19 LAB — BPAM RBC
Blood Product Expiration Date: 202208262359
ISSUE DATE / TIME: 202208061050
Unit Type and Rh: 600

## 2020-11-21 ENCOUNTER — Other Ambulatory Visit: Payer: Self-pay | Admitting: Hematology

## 2020-11-21 ENCOUNTER — Other Ambulatory Visit: Payer: Self-pay | Admitting: Nurse Practitioner

## 2020-11-21 DIAGNOSIS — I10 Essential (primary) hypertension: Secondary | ICD-10-CM

## 2020-11-22 ENCOUNTER — Encounter: Payer: Self-pay | Admitting: Hematology

## 2020-11-23 ENCOUNTER — Other Ambulatory Visit: Payer: Self-pay

## 2020-11-23 ENCOUNTER — Other Ambulatory Visit: Payer: Medicaid Other

## 2020-11-23 ENCOUNTER — Other Ambulatory Visit: Payer: Self-pay | Admitting: Hematology

## 2020-11-23 ENCOUNTER — Inpatient Hospital Stay: Payer: Medicaid Other

## 2020-11-23 VITALS — BP 133/48 | HR 51 | Temp 98.7°F | Resp 18 | Wt 239.0 lb

## 2020-11-23 DIAGNOSIS — R808 Other proteinuria: Secondary | ICD-10-CM

## 2020-11-23 DIAGNOSIS — D500 Iron deficiency anemia secondary to blood loss (chronic): Secondary | ICD-10-CM

## 2020-11-23 DIAGNOSIS — Z95828 Presence of other vascular implants and grafts: Secondary | ICD-10-CM

## 2020-11-23 DIAGNOSIS — I780 Hereditary hemorrhagic telangiectasia: Secondary | ICD-10-CM

## 2020-11-23 DIAGNOSIS — Z5112 Encounter for antineoplastic immunotherapy: Secondary | ICD-10-CM | POA: Diagnosis not present

## 2020-11-23 LAB — CBC WITH DIFFERENTIAL (CANCER CENTER ONLY)
Abs Immature Granulocytes: 0.03 10*3/uL (ref 0.00–0.07)
Basophils Absolute: 0 10*3/uL (ref 0.0–0.1)
Basophils Relative: 0 %
Eosinophils Absolute: 0.1 10*3/uL (ref 0.0–0.5)
Eosinophils Relative: 1 %
HCT: 28.4 % — ABNORMAL LOW (ref 36.0–46.0)
Hemoglobin: 8.4 g/dL — ABNORMAL LOW (ref 12.0–15.0)
Immature Granulocytes: 0 %
Lymphocytes Relative: 14 %
Lymphs Abs: 1.4 10*3/uL (ref 0.7–4.0)
MCH: 26.8 pg (ref 26.0–34.0)
MCHC: 29.6 g/dL — ABNORMAL LOW (ref 30.0–36.0)
MCV: 90.7 fL (ref 80.0–100.0)
Monocytes Absolute: 0.6 10*3/uL (ref 0.1–1.0)
Monocytes Relative: 6 %
Neutro Abs: 7.8 10*3/uL — ABNORMAL HIGH (ref 1.7–7.7)
Neutrophils Relative %: 79 %
Platelet Count: 240 10*3/uL (ref 150–400)
RBC: 3.13 MIL/uL — ABNORMAL LOW (ref 3.87–5.11)
RDW: 21.5 % — ABNORMAL HIGH (ref 11.5–15.5)
WBC Count: 9.9 10*3/uL (ref 4.0–10.5)
nRBC: 0 % (ref 0.0–0.2)

## 2020-11-23 LAB — SAMPLE TO BLOOD BANK

## 2020-11-23 LAB — TOTAL PROTEIN, URINE DIPSTICK: Protein, ur: 100 mg/dL — AB

## 2020-11-23 MED ORDER — HEPARIN SOD (PORK) LOCK FLUSH 100 UNIT/ML IV SOLN
500.0000 [IU] | Freq: Once | INTRAVENOUS | Status: AC | PRN
  Administered 2020-11-23: 500 [IU]
  Filled 2020-11-23: qty 5

## 2020-11-23 MED ORDER — SODIUM CHLORIDE 0.9% FLUSH
10.0000 mL | INTRAVENOUS | Status: DC | PRN
  Administered 2020-11-23: 10 mL
  Filled 2020-11-23: qty 10

## 2020-11-23 MED ORDER — SODIUM CHLORIDE 0.9 % IV SOLN
500.0000 mg | Freq: Once | INTRAVENOUS | Status: AC
  Administered 2020-11-23: 500 mg via INTRAVENOUS
  Filled 2020-11-23: qty 16

## 2020-11-23 MED ORDER — SODIUM CHLORIDE 0.9 % IV SOLN
Freq: Once | INTRAVENOUS | Status: AC
  Filled 2020-11-23: qty 250

## 2020-11-23 MED ORDER — SODIUM CHLORIDE 0.9% FLUSH
10.0000 mL | Freq: Once | INTRAVENOUS | Status: AC
  Administered 2020-11-23: 10 mL
  Filled 2020-11-23: qty 10

## 2020-11-23 NOTE — Patient Instructions (Signed)
[facility]   °Discharge Instructions: °Thank you for choosing [facility] to provide your oncology and hematology care.  ° °If you have a lab appointment with the Cancer Center, please go directly to the Cancer Center and check in at the registration area. °  °Wear comfortable clothing and clothing appropriate for easy access to any Portacath or PICC line.  ° °We strive to give you quality time with your provider. You may need to reschedule your appointment if you arrive late (15 or more minutes).  Arriving late affects you and other patients whose appointments are after yours.  Also, if you miss three or more appointments without notifying the office, you may be dismissed from the clinic at the provider’s discretion.    °  °For prescription refill requests, have your pharmacy contact our office and allow 72 hours for refills to be completed.   ° °Today you received the following chemotherapy and/or immunotherapy agents: avastin  °  °To help prevent nausea and vomiting after your treatment, we encourage you to take your nausea medication as directed. ° °BELOW ARE SYMPTOMS THAT SHOULD BE REPORTED IMMEDIATELY: °*FEVER GREATER THAN 100.4 F (38 °C) OR HIGHER °*CHILLS OR SWEATING °*NAUSEA AND VOMITING THAT IS NOT CONTROLLED WITH YOUR NAUSEA MEDICATION °*UNUSUAL SHORTNESS OF BREATH °*UNUSUAL BRUISING OR BLEEDING °*URINARY PROBLEMS (pain or burning when urinating, or frequent urination) °*BOWEL PROBLEMS (unusual diarrhea, constipation, pain near the anus) °TENDERNESS IN MOUTH AND THROAT WITH OR WITHOUT PRESENCE OF ULCERS (sore throat, sores in mouth, or a toothache) °UNUSUAL RASH, SWELLING OR PAIN  °UNUSUAL VAGINAL DISCHARGE OR ITCHING  ° °Items with * indicate a potential emergency and should be followed up as soon as possible or go to the Emergency Department if any problems should occur. ° °Please show the CHEMOTHERAPY ALERT CARD or IMMUNOTHERAPY ALERT CARD at check-in to  the Emergency Department and triage nurse. ° °Should you have questions after your visit or need to cancel or reschedule your appointment, please contact [facility]  Dept: [phone]  and follow the prompts.  Office hours are 8:00 a.m. to 4:30 p.m. Monday - Friday. Please note that voicemails left after 4:00 p.m. may not be returned until the following business day.  We are closed weekends and major holidays. You have access to a nurse at all times for urgent questions. Please call the main number to the clinic Dept: [phone] and follow the prompts. ° ° °For any non-urgent questions, you may also contact your provider using MyChart. We now offer e-Visits for anyone 18 and older to request care online for non-urgent symptoms. For details visit [email or web address]. °  °Also download the MyChart app! Go to the app store, search "MyChart", open the app, select [facility], and log in with your MyChart username and password. ° °Due to Covid, a mask is required upon entering the hospital/clinic. If you do not have a mask, one will be given to you upon arrival. For doctor visits, patients may have 1 support person aged 18 or older with them. For treatment visits, patients cannot have anyone with them due to current Covid guidelines and our immunocompromised population.  ° °

## 2020-11-24 LAB — FERRITIN: Ferritin: 203 ng/mL (ref 11–307)

## 2020-11-24 MED FILL — Sod Ferric Gluc Cmplx in Sucrose IV Soln 12.5 MG/ML (Fe Eq): INTRAVENOUS | Qty: 20 | Status: AC

## 2020-11-25 ENCOUNTER — Encounter: Payer: Self-pay | Admitting: Hematology

## 2020-11-25 ENCOUNTER — Other Ambulatory Visit: Payer: Self-pay

## 2020-11-25 ENCOUNTER — Inpatient Hospital Stay: Payer: Medicaid Other

## 2020-11-25 VITALS — BP 154/62 | HR 57 | Temp 98.7°F | Resp 19

## 2020-11-25 DIAGNOSIS — Z5112 Encounter for antineoplastic immunotherapy: Secondary | ICD-10-CM | POA: Diagnosis not present

## 2020-11-25 DIAGNOSIS — Z95828 Presence of other vascular implants and grafts: Secondary | ICD-10-CM

## 2020-11-25 DIAGNOSIS — D500 Iron deficiency anemia secondary to blood loss (chronic): Secondary | ICD-10-CM

## 2020-11-25 MED ORDER — HEPARIN SOD (PORK) LOCK FLUSH 100 UNIT/ML IV SOLN
500.0000 [IU] | Freq: Once | INTRAVENOUS | Status: AC
  Administered 2020-11-25: 500 [IU]
  Filled 2020-11-25: qty 5

## 2020-11-25 MED ORDER — SODIUM CHLORIDE 0.9 % IV SOLN
INTRAVENOUS | Status: DC
  Filled 2020-11-25: qty 250

## 2020-11-25 MED ORDER — SODIUM CHLORIDE 0.9 % IV SOLN
250.0000 mg | Freq: Once | INTRAVENOUS | Status: AC
  Administered 2020-11-25: 250 mg via INTRAVENOUS

## 2020-11-25 NOTE — Patient Instructions (Signed)
Sodium Ferric Gluconate Complex injection What is this medication? SODIUM FERRIC GLUCONATE COMPLEX (SOE dee um FER ik GLOO koe nate KOM pleks) is an iron replacement. It is used with epoetin therapy to treat low iron levelsin patients who are receiving hemodialysis. This medicine may be used for other purposes; ask your health care provider orpharmacist if you have questions. COMMON BRAND NAME(S): Ferrlecit, Nulecit What should I tell my care team before I take this medication? They need to know if you have any of the following conditions: anemia that is not from iron deficiency high levels of iron in the body an unusual or allergic reaction to iron, benzyl alcohol, other medicines, foods, dyes, or preservatives pregnant or are trying to become pregnant breast-feeding How should I use this medication? This medicine is for infusion into a vein. It is given by a health careprofessional in a hospital or clinic setting. Talk to your pediatrician regarding the use of this medicine in children. While this drug may be prescribed for children as young as 6 years old for selectedconditions, precautions do apply. Overdosage: If you think you have taken too much of this medicine contact apoison control center or emergency room at once. NOTE: This medicine is only for you. Do not share this medicine with others. What if I miss a dose? It is important not to miss your dose. Call your doctor or health careprofessional if you are unable to keep an appointment. What may interact with this medication? Do not take this medicine with any of the following medications: deferoxamine dimercaprol other iron products This medicine may also interact with the following medications: chloramphenicol deferasirox medicine for blood pressure like enalapril This list may not describe all possible interactions. Give your health care provider a list of all the medicines, herbs, non-prescription drugs, or dietary  supplements you use. Also tell them if you smoke, drink alcohol, or use illegaldrugs. Some items may interact with your medicine. What should I watch for while using this medication? Your condition will be monitored carefully while you are receiving thismedicine. Visit your doctor for check-ups as directed. What side effects may I notice from receiving this medication? Side effects that you should report to your doctor or health care professionalas soon as possible: allergic reactions like skin rash, itching or hives, swelling of the face, lips, or tongue breathing problems changes in hearing changes in vision chills, flushing, or sweating fast, irregular heartbeat feeling faint or lightheaded, falls fever, flu-like symptoms high or low blood pressure pain, tingling, numbness in the hands or feet severe pain in the chest, back, flanks, or groin swelling of the ankles, feet, hands trouble passing urine or change in the amount of urine unusually weak or tired Side effects that usually do not require medical attention (report to yourdoctor or health care professional if they continue or are bothersome): cramps dark colored stools diarrhea headache nausea, vomiting stomach upset This list may not describe all possible side effects. Call your doctor for medical advice about side effects. You may report side effects to FDA at1-800-FDA-1088. Where should I keep my medication? This drug is given in a hospital or clinic and will not be stored at home. NOTE: This sheet is a summary. It may not cover all possible information. If you have questions about this medicine, talk to your doctor, pharmacist, orhealth care provider.  2022 Elsevier/Gold Standard (2007-12-02 15:58:57)  

## 2020-11-25 NOTE — Progress Notes (Signed)
Patient tolerated Ferrlecit infusion well today, no concerns voiced. Patient discharged. Stable.

## 2020-11-30 ENCOUNTER — Other Ambulatory Visit: Payer: Self-pay

## 2020-11-30 ENCOUNTER — Inpatient Hospital Stay: Payer: Medicaid Other

## 2020-11-30 VITALS — BP 137/69 | HR 68 | Temp 98.8°F | Resp 18

## 2020-11-30 DIAGNOSIS — I10 Essential (primary) hypertension: Secondary | ICD-10-CM

## 2020-11-30 DIAGNOSIS — D500 Iron deficiency anemia secondary to blood loss (chronic): Secondary | ICD-10-CM

## 2020-11-30 DIAGNOSIS — I780 Hereditary hemorrhagic telangiectasia: Secondary | ICD-10-CM

## 2020-11-30 DIAGNOSIS — Z95828 Presence of other vascular implants and grafts: Secondary | ICD-10-CM

## 2020-11-30 DIAGNOSIS — Z5112 Encounter for antineoplastic immunotherapy: Secondary | ICD-10-CM | POA: Diagnosis not present

## 2020-11-30 LAB — CBC WITH DIFFERENTIAL (CANCER CENTER ONLY)
Abs Immature Granulocytes: 0.05 10*3/uL (ref 0.00–0.07)
Basophils Absolute: 0 10*3/uL (ref 0.0–0.1)
Basophils Relative: 0 %
Eosinophils Absolute: 0.1 10*3/uL (ref 0.0–0.5)
Eosinophils Relative: 1 %
HCT: 29.3 % — ABNORMAL LOW (ref 36.0–46.0)
Hemoglobin: 8.9 g/dL — ABNORMAL LOW (ref 12.0–15.0)
Immature Granulocytes: 0 %
Lymphocytes Relative: 10 %
Lymphs Abs: 1.2 10*3/uL (ref 0.7–4.0)
MCH: 27.4 pg (ref 26.0–34.0)
MCHC: 30.4 g/dL (ref 30.0–36.0)
MCV: 90.2 fL (ref 80.0–100.0)
Monocytes Absolute: 0.5 10*3/uL (ref 0.1–1.0)
Monocytes Relative: 4 %
Neutro Abs: 10.8 10*3/uL — ABNORMAL HIGH (ref 1.7–7.7)
Neutrophils Relative %: 85 %
Platelet Count: 244 10*3/uL (ref 150–400)
RBC: 3.25 MIL/uL — ABNORMAL LOW (ref 3.87–5.11)
RDW: 21.7 % — ABNORMAL HIGH (ref 11.5–15.5)
WBC Count: 12.7 10*3/uL — ABNORMAL HIGH (ref 4.0–10.5)
nRBC: 0 % (ref 0.0–0.2)

## 2020-11-30 LAB — SAMPLE TO BLOOD BANK

## 2020-11-30 MED ORDER — SODIUM CHLORIDE 0.9% FLUSH
10.0000 mL | Freq: Once | INTRAVENOUS | Status: AC
  Administered 2020-11-30: 10 mL

## 2020-11-30 MED ORDER — HEPARIN SOD (PORK) LOCK FLUSH 100 UNIT/ML IV SOLN: 500.0000 [IU] | Freq: Once | INTRAVENOUS | Status: DC | PRN

## 2020-11-30 MED ORDER — SODIUM CHLORIDE 0.9% FLUSH: 10.0000 mL | INTRAVENOUS | Status: DC | PRN

## 2020-11-30 MED ORDER — SODIUM CHLORIDE 0.9 % IV SOLN: 5.0000 mg/kg | Freq: Once | INTRAVENOUS | Status: DC

## 2020-11-30 MED ORDER — HYDROCHLOROTHIAZIDE 12.5 MG PO TABS: ORAL_TABLET | ORAL | 1 refills | Status: DC

## 2020-11-30 MED ORDER — SODIUM CHLORIDE 0.9 % IV SOLN: Freq: Once | INTRAVENOUS | Status: AC

## 2020-11-30 MED ORDER — SODIUM CHLORIDE 0.9 % IV SOLN
250.0000 mg | Freq: Once | INTRAVENOUS | Status: AC
  Administered 2020-11-30: 250 mg via INTRAVENOUS
  Filled 2020-11-30: qty 20

## 2020-11-30 MED ORDER — HEPARIN SOD (PORK) LOCK FLUSH 100 UNIT/ML IV SOLN
500.0000 [IU] | Freq: Once | INTRAVENOUS | Status: AC
  Administered 2020-11-30: 500 [IU]

## 2020-11-30 NOTE — Patient Instructions (Signed)
Sodium Ferric Gluconate Complex injection What is this medication? SODIUM FERRIC GLUCONATE COMPLEX (SOE dee um FER ik GLOO koe nate KOM pleks) is an iron replacement. It is used with epoetin therapy to treat low iron levelsin patients who are receiving hemodialysis. This medicine may be used for other purposes; ask your health care provider orpharmacist if you have questions. COMMON BRAND NAME(S): Ferrlecit, Nulecit What should I tell my care team before I take this medication? They need to know if you have any of the following conditions: anemia that is not from iron deficiency high levels of iron in the body an unusual or allergic reaction to iron, benzyl alcohol, other medicines, foods, dyes, or preservatives pregnant or are trying to become pregnant breast-feeding How should I use this medication? This medicine is for infusion into a vein. It is given by a health careprofessional in a hospital or clinic setting. Talk to your pediatrician regarding the use of this medicine in children. While this drug may be prescribed for children as young as 6 years old for selectedconditions, precautions do apply. Overdosage: If you think you have taken too much of this medicine contact apoison control center or emergency room at once. NOTE: This medicine is only for you. Do not share this medicine with others. What if I miss a dose? It is important not to miss your dose. Call your doctor or health careprofessional if you are unable to keep an appointment. What may interact with this medication? Do not take this medicine with any of the following medications: deferoxamine dimercaprol other iron products This medicine may also interact with the following medications: chloramphenicol deferasirox medicine for blood pressure like enalapril This list may not describe all possible interactions. Give your health care provider a list of all the medicines, herbs, non-prescription drugs, or dietary  supplements you use. Also tell them if you smoke, drink alcohol, or use illegaldrugs. Some items may interact with your medicine. What should I watch for while using this medication? Your condition will be monitored carefully while you are receiving thismedicine. Visit your doctor for check-ups as directed. What side effects may I notice from receiving this medication? Side effects that you should report to your doctor or health care professionalas soon as possible: allergic reactions like skin rash, itching or hives, swelling of the face, lips, or tongue breathing problems changes in hearing changes in vision chills, flushing, or sweating fast, irregular heartbeat feeling faint or lightheaded, falls fever, flu-like symptoms high or low blood pressure pain, tingling, numbness in the hands or feet severe pain in the chest, back, flanks, or groin swelling of the ankles, feet, hands trouble passing urine or change in the amount of urine unusually weak or tired Side effects that usually do not require medical attention (report to yourdoctor or health care professional if they continue or are bothersome): cramps dark colored stools diarrhea headache nausea, vomiting stomach upset This list may not describe all possible side effects. Call your doctor for medical advice about side effects. You may report side effects to FDA at1-800-FDA-1088. Where should I keep my medication? This drug is given in a hospital or clinic and will not be stored at home. NOTE: This sheet is a summary. It may not cover all possible information. If you have questions about this medicine, talk to your doctor, pharmacist, orhealth care provider.  2022 Elsevier/Gold Standard (2007-12-02 15:58:57)  

## 2020-11-30 NOTE — Progress Notes (Signed)
Pt refuses to wait post iron infusion.  Pt stated she have received iron numerous times in the past and never had any problems.  Educated pt on why post observation is needed.  Pt verbalized understanding of teaching but insisted on not staying for post observation.

## 2020-12-04 ENCOUNTER — Other Ambulatory Visit: Payer: Self-pay | Admitting: Hematology

## 2020-12-05 ENCOUNTER — Encounter: Payer: Medicaid Other | Admitting: Internal Medicine

## 2020-12-05 ENCOUNTER — Ambulatory Visit: Payer: Medicaid Other | Admitting: Behavioral Health

## 2020-12-05 DIAGNOSIS — F331 Major depressive disorder, recurrent, moderate: Secondary | ICD-10-CM

## 2020-12-05 DIAGNOSIS — M1712 Unilateral primary osteoarthritis, left knee: Secondary | ICD-10-CM | POA: Diagnosis not present

## 2020-12-05 DIAGNOSIS — M47816 Spondylosis without myelopathy or radiculopathy, lumbar region: Secondary | ICD-10-CM | POA: Diagnosis not present

## 2020-12-05 DIAGNOSIS — F17210 Nicotine dependence, cigarettes, uncomplicated: Secondary | ICD-10-CM | POA: Diagnosis not present

## 2020-12-05 DIAGNOSIS — F419 Anxiety disorder, unspecified: Secondary | ICD-10-CM

## 2020-12-05 NOTE — BH Specialist Note (Signed)
Integrated Behavioral Health via Telemedicine Visit  12/05/2020 KATELAN KIPPER [id number]  Number of Integrated Behavioral Health visits: 7 Session Start time: 9:00am  Session End time: 9:20am Total time: 20  Referring Provider: Dr. Molli Hazard, DO Patient/Family location: Pt is home in private [facility] Provider location: Working remotely All persons participating in visit: Pt & Clinician Types of Service: Individual psychotherapy  I connected with [name] and/or [name]  self  via  Telephone or Video Enabled Telemedicine Application  (Video is Caregility application) and verified that I am speaking with the correct person using two identifiers. Discussed confidentiality:  self  I discussed the limitations of telemedicine and the availability of in person appointments.  Discussed there is a possibility of technology failure and discussed alternative modes of communication if that failure occurs.  I discussed that engaging in this telemedicine visit, they consent to the provision of behavioral healthcare and the services will be billed under their insurance.  Patient and/or legal guardian expressed understanding and consented to Telemedicine visit:  self  Presenting Concerns: Patient and/or family reports the following symptoms/concerns: elevated anxiety due to nosebleed on Monday that lasted one hour & contained clots Duration of problem: one hour; Severity of problem: moderate  Patient and/or Family's Strengths/Protective Factors: Social connections, Social and Emotional competence, Concrete supports in place (healthy food, safe environments, etc.), Sense of purpose, and Physical Health (exercise, healthy diet, medication compliance, etc.)  Goals Addressed: Patient will:  Reduce symptoms of: anxiety and depression   Increase knowledge and/or ability of: coping skills   Demonstrate ability to: Increase healthy adjustment to current life  circumstances  Progress towards Goals: Ongoing  Interventions: Interventions utilized:  Supportive Counseling Standardized Assessments completed:  screeners prn  Patient and/or Family Response: Pt receptive to call & also has 2 Physician appts today. Her first is w/Dr. Jacelyn Grip @ [facility]. Her back is hurting & she needs X-Rays to secure her cortisone shots. The second is w/her PCP @ [facility].  Assessment: Patient currently experiencing worry due to recent nose bleed that was fairly severe. Pt sts it lasted for one hour & contained clots. Her Son Audry Pili was home w/her @ the time.  Patient may benefit from suggestions/tips/guidance on how to handle nosebleeds as a s/e of chemo infusions. Directed Pt to ask her PCP today about this situation so she has the tools & materials to address this issues.  Plan: Follow up with behavioral health clinician on : 2-3 wks on telehealth for 30 min Behavioral recommendations: f/u today for nosebleed directions in case this happens in the future Referral(s):  None @ this time  I discussed the assessment and treatment plan with the patient and/or parent/guardian. They were provided an opportunity to ask questions and all were answered. They agreed with the plan and demonstrated an understanding of the instructions.   They were advised to call back or seek an in-person evaluation if the symptoms worsen or if the condition fails to improve as anticipated.  [name], LMFT

## 2020-12-06 DIAGNOSIS — N319 Neuromuscular dysfunction of bladder, unspecified: Secondary | ICD-10-CM | POA: Diagnosis not present

## 2020-12-06 DIAGNOSIS — N3941 Urge incontinence: Secondary | ICD-10-CM | POA: Diagnosis not present

## 2020-12-07 ENCOUNTER — Other Ambulatory Visit: Payer: Self-pay

## 2020-12-07 ENCOUNTER — Encounter: Payer: Self-pay | Admitting: Hematology

## 2020-12-07 ENCOUNTER — Inpatient Hospital Stay (HOSPITAL_BASED_OUTPATIENT_CLINIC_OR_DEPARTMENT_OTHER): Payer: Medicaid Other | Admitting: Hematology

## 2020-12-07 ENCOUNTER — Inpatient Hospital Stay: Payer: Medicaid Other

## 2020-12-07 VITALS — BP 109/58 | HR 72 | Temp 98.6°F | Resp 16

## 2020-12-07 DIAGNOSIS — R040 Epistaxis: Secondary | ICD-10-CM

## 2020-12-07 DIAGNOSIS — I780 Hereditary hemorrhagic telangiectasia: Secondary | ICD-10-CM

## 2020-12-07 DIAGNOSIS — R808 Other proteinuria: Secondary | ICD-10-CM

## 2020-12-07 DIAGNOSIS — Z5112 Encounter for antineoplastic immunotherapy: Secondary | ICD-10-CM | POA: Diagnosis not present

## 2020-12-07 DIAGNOSIS — Z95828 Presence of other vascular implants and grafts: Secondary | ICD-10-CM

## 2020-12-07 DIAGNOSIS — D500 Iron deficiency anemia secondary to blood loss (chronic): Secondary | ICD-10-CM

## 2020-12-07 LAB — CBC WITH DIFFERENTIAL (CANCER CENTER ONLY)
Abs Immature Granulocytes: 0.06 10*3/uL (ref 0.00–0.07)
Basophils Absolute: 0 10*3/uL (ref 0.0–0.1)
Basophils Relative: 0 %
Eosinophils Absolute: 0.1 10*3/uL (ref 0.0–0.5)
Eosinophils Relative: 0 %
HCT: 28.8 % — ABNORMAL LOW (ref 36.0–46.0)
Hemoglobin: 8.7 g/dL — ABNORMAL LOW (ref 12.0–15.0)
Immature Granulocytes: 0 %
Lymphocytes Relative: 7 %
Lymphs Abs: 1.1 10*3/uL (ref 0.7–4.0)
MCH: 27.2 pg (ref 26.0–34.0)
MCHC: 30.2 g/dL (ref 30.0–36.0)
MCV: 90 fL (ref 80.0–100.0)
Monocytes Absolute: 1.2 10*3/uL — ABNORMAL HIGH (ref 0.1–1.0)
Monocytes Relative: 8 %
Neutro Abs: 12.7 10*3/uL — ABNORMAL HIGH (ref 1.7–7.7)
Neutrophils Relative %: 85 %
Platelet Count: 264 10*3/uL (ref 150–400)
RBC: 3.2 MIL/uL — ABNORMAL LOW (ref 3.87–5.11)
RDW: 20.4 % — ABNORMAL HIGH (ref 11.5–15.5)
WBC Count: 15.2 10*3/uL — ABNORMAL HIGH (ref 4.0–10.5)
nRBC: 0 % (ref 0.0–0.2)

## 2020-12-07 LAB — CMP (CANCER CENTER ONLY)
ALT: 6 U/L (ref 0–44)
AST: 13 U/L — ABNORMAL LOW (ref 15–41)
Albumin: 3.2 g/dL — ABNORMAL LOW (ref 3.5–5.0)
Alkaline Phosphatase: 72 U/L (ref 38–126)
Anion gap: 9 (ref 5–15)
BUN: 13 mg/dL (ref 6–20)
CO2: 24 mmol/L (ref 22–32)
Calcium: 9.1 mg/dL (ref 8.9–10.3)
Chloride: 103 mmol/L (ref 98–111)
Creatinine: 1.21 mg/dL — ABNORMAL HIGH (ref 0.44–1.00)
GFR, Estimated: 52 mL/min — ABNORMAL LOW (ref >60)
Glucose, Bld: 116 mg/dL — ABNORMAL HIGH (ref 70–99)
Potassium: 3.4 mmol/L — ABNORMAL LOW (ref 3.5–5.1)
Sodium: 136 mmol/L (ref 135–145)
Total Bilirubin: 0.5 mg/dL (ref 0.3–1.2)
Total Protein: 7.3 g/dL (ref 6.5–8.1)

## 2020-12-07 LAB — IRON AND TIBC
Iron: 14 ug/dL — ABNORMAL LOW (ref 41–142)
Saturation Ratios: 6 % — ABNORMAL LOW (ref 21–57)
TIBC: 218 ug/dL — ABNORMAL LOW (ref 236–444)
UIBC: 204 ug/dL (ref 120–384)

## 2020-12-07 LAB — FERRITIN: Ferritin: 478 ng/mL — ABNORMAL HIGH (ref 11–307)

## 2020-12-07 LAB — SAMPLE TO BLOOD BANK

## 2020-12-07 MED ORDER — HEPARIN SOD (PORK) LOCK FLUSH 100 UNIT/ML IV SOLN
500.0000 [IU] | Freq: Once | INTRAVENOUS | Status: AC
  Administered 2020-12-07: 500 [IU]

## 2020-12-07 MED ORDER — AMINOCAPROIC ACID 1000 MG PO TABS: 1000.0000 mg | ORAL_TABLET | Freq: Two times a day (BID) | ORAL | 2 refills | Status: DC

## 2020-12-07 MED ORDER — SODIUM CHLORIDE 0.9% FLUSH
10.0000 mL | Freq: Once | INTRAVENOUS | Status: AC
  Administered 2020-12-07: 10 mL

## 2020-12-07 MED ORDER — SODIUM CHLORIDE 0.9 % IV SOLN
250.0000 mg | Freq: Once | INTRAVENOUS | Status: AC
  Administered 2020-12-07: 250 mg via INTRAVENOUS
  Filled 2020-12-07: qty 20

## 2020-12-07 MED ORDER — SODIUM CHLORIDE 0.9 % IV SOLN: Freq: Once | INTRAVENOUS | Status: AC

## 2020-12-07 MED ORDER — SODIUM CHLORIDE 0.9 % IV SOLN
5.0000 mg/kg | Freq: Once | INTRAVENOUS | Status: AC
  Administered 2020-12-07: 500 mg via INTRAVENOUS
  Filled 2020-12-07: qty 16

## 2020-12-07 NOTE — Progress Notes (Signed)
[facility]   [phone] Fax:([phone]) [phone]   Clinic Follow up Note   Patient Care Team: [name], MD as PCP - General  Date of Service:  12/07/2020  CHIEF COMPLAINT: f/u of HHT and anemia  ASSESSMENT & PLAN:  Peggy House is a 60 y.o. female with   1. 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  -She was admitted on 11/01/20 for symptomatic anemia following a syncopal episode, as well as hematochezia. -Hgb 8.7 today (8/28522). No need for blood transfusion. She will proceed with IV iron and beva today  -Follow-up in 4 weeks   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 -Pt was hospitalized several times from 1-10/2017 for severe recurrent GI bleeding from AVMs which required multiple blood transfusions and APCs. 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  -She is s/p IR embolization of the left Gastric and short gastric artery branch vessels without evidence of residual flow in the Dieulafoy lesion on 12/10/2017.  -She required cauterization for stomach for severe Gi bleeding in 11/2018, and epinephrine injection for bleeding ulcer on 11/03/20 -She has been on bevacizumab every 2-4 week, treatment has been intermittently held due to large proteinuria. Will check urine today when she is able to provide a sample.   3. B/l LE Edema -Stable. Continue compression socks and leg elevation  -[place] of leg found lymphadenopathy of b/l groin. She does have skin darkening of her legs  which she has had in recent months.    4. Smoking cessation -She was smoking 10 cigarettes a day on average before recent hospitalization. She is smoking about 1/2 ppd (as of 08/03/20) -I strongly encouraged her to stop smoking completely. She tried previously but had trouble.   5. HTN, uncontrolled, Recently diagnosed DM -Continue medications. Will monitor on Avastin.   -Continue to f/u with PCP     PLAN:  -Proceed with IV Ferric Gluconate '250mg'$  and beva oday -flush, labs and Ferric Gluconate '250mg'$  (unless Ferritin >200 on last lab) and beva (if urine protein <=100) every 2 weeks -lab in one week to see if she needs blood transfusion (if hg<8.0) -f/u in 4 weeks -I refilled amicar today    No problem-specific Assessment & Plan notes found for this encounter.   PREVIOUS THERAPY: -Multiple EGD, small bowel enteroscope, C-scope with APC -Oral and iv amicar were given during hospital stay, no effect  -IR embolization of of the left gastric and short gastric artery branch vessels without evidence of residual flow in the Dieulafoy lesion on 12/10/17 -Avastin on 8/2 and 8/30 at [facility]; starting 12/26/17 continue 5 mg/kg q2 weeks until 02/20/18, restarted on 04/03/2018  -Tamoxifen started in 10/2017 for [place] stopped 12/10/18 due to DVT   CURRENT THERAPY:  -Blood transfusion for severe anemia secondary to GI bleeding as needed  -iv ferric gluconate every 1-2 weeks as needed with ferritin goal 100-200. Increased to weekly on 07/29/19. Reduced to every 2 weeks on 11/12/19  -Restarted Avastin/Zirabev q2weeks on 04/03/18. Reduced to every 4 weeks starting 09/25/19. Increased  to every 2 weeks on 01/06/20. Increased dose to '250mg'$  on 01/20/20 -Amicar 1g bid starting 07/17/2018. Reduced to only as needed on 12/10/18 due to DVT. Restart daily on 06/16/20 due to increased bleeding. Increased to BID, then TID on 07/07/20.  INTERVAL HISTORY:  Peggy House is here for a follow up of HHT and anemia. She was last  seen by me on 11/09/20. She was evaluated in the infusion area. She reports feeling weak today. She reports she had two nosebleeds in the last week. She notes the one on Friday, 8/19, lasted for 2 hours and came out of both nostrils. She reports pain to her knee and her back. She notes she is planning to follow up with her orthopedist.   All other systems were reviewed with the patient and are negative.  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    SURGICAL HISTORY: Past Surgical History:  Procedure Laterality Date   ABDOMINAL HYSTERECTOMY     CARPAL TUNNEL RELEASE  05/13/2011   Procedure: CARPAL TUNNEL RELEASE;  Surgeon: Nita Sells, MD;  Location: [place];  Service: Orthopedics;  Laterality: Left;   COLONOSCOPY N/A 03/02/2020   Procedure: COLONOSCOPY;  Surgeon: Ronald Lobo, MD;  Location: [facility] END[facility];  Service: Endoscopy;  Laterality: N/A;   COLONOSCOPY WITH PROPOFOL N/A 04/28/2014   Procedure: COLONOSCOPY WITH PROPOFOL;  Surgeon: Cleotis Nipper, MD;  Location: [facility] ENDOSCOPY;  Service: Endoscopy;  Laterality: N/A;   DG TOES*L*  2/10   rt   DILATION AND CURETTAGE OF UTERUS     ENTEROSCOPY N/A 10/17/2017   Procedure: ENTEROSCOPY;  Surgeon: Otis Brace, MD;  Location: [place];  Service: Gastroenterology;  Laterality: N/A;   ESOPHAGOGASTRODUODENOSCOPY N/A 04/10/2014   Procedure: ESOPHAGOGASTRODUODENOSCOPY (EGD);  Surgeon: [name], MD;  Location: [facility] ENDOSCOPY;  Service: Endoscopy;  Laterality: N/A;   ESOPHAGOGASTRODUODENOSCOPY N/A 05/10/2017   Procedure:  ESOPHAGOGASTRODUODENOSCOPY (EGD);  Surgeon: Ronald Lobo, MD;  Location: [facility] ENDOSCOPY;  Service: Endoscopy;  Laterality: N/A;   ESOPHAGOGASTRODUODENOSCOPY N/A 09/22/2017   Procedure: ESOPHAGOGASTRODUODENOSCOPY (EGD);  Surgeon: Clarene Essex, MD;  Location: [place];  Service: Endoscopy;  Laterality: N/A;  bedside   ESOPHAGOGASTRODUODENOSCOPY N/A 03/02/2020   Procedure: ESOPHAGOGASTRODUODENOSCOPY (EGD);  Surgeon: Ronald Lobo, MD;  Location: [facility];  Service: Endoscopy;  Laterality: N/A;   ESOPHAGOGASTRODUODENOSCOPY N/A 11/03/2020   Procedure: ESOPHAGOGASTRODUODENOSCOPY (EGD);  Surgeon: Wilford Corner, MD;  Location: [place];  Service: Endoscopy;  Laterality: N/A;   ESOPHAGOGASTRODUODENOSCOPY (EGD) WITH PROPOFOL N/A 04/27/2014   Procedure: ESOPHAGOGASTRODUODENOSCOPY (EGD) WITH PROPOFOL;  Surgeon: Cleotis Nipper, MD;  Location: [place];  Service: Endoscopy;  Laterality: N/A;  possible apc   ESOPHAGOGASTRODUODENOSCOPY (EGD) WITH PROPOFOL N/A 09/30/2017   Procedure: ESOPHAGOGASTRODUODENOSCOPY (EGD) WITH PROPOFOL;  Surgeon: [name], MD;  Location: [place];  Service: Gastroenterology;  Laterality: N/A;   ESOPHAGOGASTRODUODENOSCOPY (EGD) WITH PROPOFOL N/A 10/01/2017   Procedure: ESOPHAGOGASTRODUODENOSCOPY (EGD) WITH PROPOFOL;  Surgeon: [name], MD;  Location: [place];  Service: Gastroenterology;  Laterality: N/A;   ESOPHAGOGASTRODUODENOSCOPY (EGD) WITH PROPOFOL N/A 10/08/2017   Procedure: ESOPHAGOGASTRODUODENOSCOPY (EGD) WITH  PROPOFOL;  Surgeon: Otis Brace, MD;  Location: [facility] ENDOSCOPY;  Service: Gastroenterology;  Laterality: N/A;   ESOPHAGOGASTRODUODENOSCOPY (EGD) WITH PROPOFOL N/A 10/17/2017   Procedure: ESOPHAGOGASTRODUODENOSCOPY (EGD) WITH PROPOFOL;  Surgeon: Otis Brace, MD;  Location: [place];  Service: Gastroenterology;  Laterality: N/A;   ESOPHAGOGASTRODUODENOSCOPY (EGD) WITH PROPOFOL N/A 10/19/2017   Procedure: ESOPHAGOGASTRODUODENOSCOPY  (EGD) WITH PROPOFOL;  Surgeon: Otis Brace, MD;  Location: [place];  Service: Gastroenterology;  Laterality: N/A;   ESOPHAGOGASTRODUODENOSCOPY (EGD) WITH PROPOFOL N/A 12/04/2018   Procedure: ESOPHAGOGASTRODUODENOSCOPY (EGD) WITH PROPOFOL;  Surgeon: Wilford Corner, MD;  [facility];  Service: Endoscopy;  Laterality: N/A;   GIVENS CAPSULE STUDY N/A 10/02/2017   Procedure: GIVENS CAPSULE STUDY;  Surgeon: [name], MD;  Location: [place];  Service: Gastroenterology;  Laterality: N/A;   GIVENS CAPSULE STUDY N/A 10/08/2017   Procedure: GIVENS CAPSULE STUDY;  Surgeon: Otis Brace, MD;  Location: [place];  Service: Gastroenterology;  Laterality: N/A;  endoscopic placement of capsule   GIVENS CAPSULE STUDY N/A 03/02/2020   Procedure: GIVENS CAPSULE STUDY;  Surgeon: Ronald Lobo, MD;  Location[facility];  Service: Endoscopy;  Laterality: N/A;   HEMOSTASIS CLIP PLACEMENT  12/04/2018   Procedure: HEMOSTASIS CLIP PLACEMENT;  Surgeon: Wilford Corner, MD;  [facility];  Service: Endoscopy;;   HOT HEMOSTASIS N/A 04/27/2014   Procedure: HOT HEMOSTASIS (ARGON PLASMA COAGULATION/BICAP);  Surgeon: Cleotis Nipper, MD;  Location[facility] ENDOSCOPY;  Service: Endoscopy;  Laterality: N/A;   HOT HEMOSTASIS N/A 09/30/2017   Procedure: HOT HEMOSTASIS (ARGON PLASMA COAGULATION/BICAP);  Surgeon: [name], MD;  Location: [place];  Service: Gastroenterology;  Laterality: N/A;   HOT HEMOSTASIS N/A 10/01/2017   Procedure: HOT HEMOSTASIS (ARGON PLASMA COAGULATION/BICAP);  Surgeon: [name], MD;  Location: [place];  Service: Gastroenterology;  Laterality: N/A;   HOT HEMOSTASIS N/A 10/17/2017   Procedure: HOT HEMOSTASIS (ARGON PLASMA COAGULATION/BICAP);  Surgeon: Otis Brace, MD;  Location: [facility] ENDOSCOPY;  Service: Gastroenterology;  Laterality: N/A;   HOT HEMOSTASIS N/A 10/19/2017   Procedure: HOT HEMOSTASIS (ARGON PLASMA COAGULATION/BICAP);  Surgeon:  Otis Brace, MD;  Location: [facility] ENDOSCOPY;  Service: Gastroenterology;  Laterality: N/A;   HOT HEMOSTASIS N/A 03/02/2020   Procedure: HOT HEMOSTASIS (ARGON PLASMA COAGULATION/BICAP);  Surgeon: Ronald Lobo, MD;  Location: [facility];  Service: Endoscopy;  Laterality: N/A;   IR IMAGING GUIDED PORT INSERTION  07/08/2018   L shoulder Surgery  2011   POLYPECTOMY  03/02/2020   Procedure: POLYPECTOMY;  Surgeon: Ronald Lobo, MD;  [facility];  Service: Endoscopy;;   SCLEROTHERAPY  11/03/2020   Procedure: Clide Deutscher;  Surgeon: Wilford Corner, MD;  Location: [facility] ENDOSCOPY;  Service: Endoscopy;;   SUBMUCOSAL INJECTION  09/22/2017   Procedure: SUBMUCOSAL INJECTION;  Surgeon: Clarene Essex, MD;  Location: [place];  Service: Endoscopy;;   SUBMUCOSAL INJECTION  12/04/2018   Procedure: SUBMUCOSAL INJECTION;  Surgeon: Wilford Corner, MD;  Location: [facility];  Service: Endoscopy;;    I have reviewed the social history and family history with the patient and they are unchanged from previous note.  ALLERGIES:  is allergic to feraheme [ferumoxytol], nsaids, tomato, iron (ferrous sulfate) [ferrous sulfate er], other, and wasp venom.  MEDICATIONS:  Current Outpatient Medications  Medication Sig Dispense Refill   Accu-Chek Softclix Lancets lancets Use to check blood sugar before breakfast and before dinner while on steroids (Patient taking differently: 1 each by Other route See admin instructions. Use to check blood sugar before breakfast and before dinner while on steroids) 100 each 1  acitretin (SORIATANE) 10 MG capsule Take 10 mg by mouth daily.     ALPRAZolam (XANAX) 0.5 MG tablet Take 1 tablet (0.5 mg total) by mouth at bedtime as needed for anxiety. 30 tablet 0   Aminocaproic Acid 1000 MG TABS Take 1 tablet (1,000 mg total) by mouth in the morning and at bedtime. TAKE 1 TABLET(1000 MG) BY MOUTH TWICE DAILY (Patient taking differently: Take 1,000 mg by mouth in the morning  and at bedtime.) 60 tablet 3   amLODipine (NORVASC) 10 MG tablet TAKE 1 TABLET(10 MG) BY MOUTH DAILY 90 tablet 0   atorvastatin (LIPITOR) 80 MG tablet TAKE 1 TABLET(80 MG) BY MOUTH DAILY 30 tablet 3   diphenoxylate-atropine (LOMOTIL) 2.5-0.025 MG tablet 1 to 2 PO QID prn diarrhea 30 tablet 1   furosemide (LASIX) 40 MG tablet Take 40 mg by mouth daily.     glucose blood (ACCU-CHEK GUIDE) test strip Check blood sugar 2 times per day while on steroids before breakfast and dinner (Patient taking differently: 1 each by Other route See admin instructions. Check blood sugar 2 times per day while on steroids before breakfast and dinner) 50 each 3   hydrochlorothiazide (HYDRODIURIL) 12.5 MG tablet TAKE 1 TABLET(12.5 MG) BY MOUTH DAILY 90 tablet 1   lidocaine-prilocaine (EMLA) cream Apply 1 application topically as needed. 30 g 0   metFORMIN (GLUCOPHAGE) 500 MG tablet TAKE 1 TABLET(500 MG) BY MOUTH TWICE DAILY WITH A MEAL 180 tablet 3   methocarbamol (ROBAXIN) 500 MG tablet Take 1 tablet (500 mg total) by mouth every 8 (eight) hours as needed for muscle spasms (back pain/spasm). 7 tablet 0   metoprolol succinate (TOPROL-XL) 100 MG 24 hr tablet Take 100 mg by mouth daily.     pantoprazole (PROTONIX) 40 MG tablet TAKE 1 TABLET(40 MG) BY MOUTH TWICE DAILY 60 tablet 5   Podiatric Products (FLEXITOL HEEL BALM) OINT Apply 1 application topically as needed (dry skin).      potassium chloride (KLOR-CON) 20 MEQ packet Take 20 mEq by mouth daily. 30 packet 1   PROAIR HFA 108 (90 Base) MCG/ACT inhaler INHALE 1 TO 2 PUFFS INTO THE LUNGS EVERY 6 HOURS AS NEEDED FOR WHEEZING OR SHORTNESS OF BREATH 8.5 g 0   prochlorperazine (COMPAZINE) 10 MG tablet Take 1 tablet (10 mg total) by mouth every 6 (six) hours as needed for nausea or vomiting. 30 tablet 1   sertraline (ZOLOFT) 25 MG tablet Take 5 tablets (125 mg total) by mouth daily. 30 tablet 0   terbinafine (LAMISIL AT) 1 % cream Apply 1 application topically 2 (two) times  daily. Both bottom and top of both feet and toes 36 g 0   traMADol (ULTRAM) 50 MG tablet Take 1 tablet (50 mg total) by mouth daily as needed for severe pain. 7 tablet 0   traZODone (DESYREL) 100 MG tablet TAKE 1 TABLET(100 MG) BY MOUTH AT BEDTIME 30 tablet 2   No current facility-administered medications for this visit.   Facility-Administered Medications Ordered in Other Visits  Medication Dose Route Frequency Provider Last Rate Last Admin   sodium chloride flush (NS) 0.9 % injection 10 mL  10 mL Intracatheter PRN Truitt Merle, MD   10 mL at 08/14/20 1725    PHYSICAL EXAMINATION: ECOG PERFORMANCE STATUS: 3 - Symptomatic, >50% confined to bed  There were no vitals filed for this visit. Wt Readings from Last 3 Encounters:  11/23/20 239 lb (108.4 kg)  11/03/20 245 lb (111.1 kg)  10/12/20 252  lb 12.8 oz (114.7 kg)     GENERAL:alert, no distress and comfortable SKIN: skin color normal, no rashes or significant lesions EYES: normal, Conjunctiva are pink and non-injected, sclera clear  NEURO: alert & oriented x 3 with fluent speech  LABORATORY DATA:  I have reviewed the data as listed CBC Latest Ref Rng & Units 12/07/2020 11/30/2020 11/23/2020  WBC 4.0 - 10.5 K/uL 15.2(H) 12.7(H) 9.9  Hemoglobin 12.0 - 15.0 g/dL 8.7(L) 8.9(L) 8.4(L)  Hematocrit 36.0 - 46.0 % 28.8(L) 29.3(L) 28.4(L)  Platelets 150 - 400 K/uL 264 244 240     CMP Latest Ref Rng & Units 12/07/2020 11/05/2020 11/04/2020  Glucose 70 - 99 mg/dL 116(H) 91 119(H)  BUN 6 - 20 mg/dL 13 5(L) 6  Creatinine 0.44 - 1.00 mg/dL 1.21(H) 0.71 0.78  Sodium 135 - 145 mmol/L 136 138 140  Potassium 3.5 - 5.1 mmol/L 3.4(L) 3.8 3.6  Chloride 98 - 111 mmol/L 103 105 108  CO2 22 - 32 mmol/L '24 25 25  '$ Calcium 8.9 - 10.3 mg/dL 9.1 8.9 9.1  Total Protein 6.5 - 8.1 g/dL 7.3 - -  Total Bilirubin 0.3 - 1.2 mg/dL 0.5 - -  Alkaline Phos 38 - 126 U/L 72 - -  AST 15 - 41 U/L 13(L) - -  ALT 0 - 44 U/L 6 - -      RADIOGRAPHIC STUDIES: I have  personally reviewed the radiological images as listed and agreed with the findings in the report. No results found.    No orders of the defined types were placed in this encounter.  All questions were answered. The patient knows to call the clinic with any problems, questions or concerns. No barriers to learning was detected. The total time spent in the appointment was 30 minutes.     Truitt Merle, MD 12/07/2020   I, Wilburn Mylar, am acting as scribe for Truitt Merle, MD.   I have reviewed the above documentation for accuracy and completeness, and I agree with the above.

## 2020-12-07 NOTE — Progress Notes (Signed)
Ok to treat with urine protein 100 from 8/11 per Dr Burr Medico.

## 2020-12-08 ENCOUNTER — Telehealth: Payer: Self-pay | Admitting: Hematology

## 2020-12-08 NOTE — Telephone Encounter (Signed)
Left message with follow-up appointment per 8/25 los.

## 2020-12-09 ENCOUNTER — Encounter: Payer: Self-pay | Admitting: Hematology

## 2020-12-11 ENCOUNTER — Telehealth: Payer: Self-pay | Admitting: *Deleted

## 2020-12-11 NOTE — Telephone Encounter (Signed)
Patient called.Has blood in stool - happened twice last night - blood on tissue and in toilet bowl. Patient states no pain with bowel movement and states she does not have hemorrhoids. She has not had a bowel movement today. She asked if Dr. Burr Medico will tell her what she should do. Information sent to Dr. Burr Medico

## 2020-12-12 ENCOUNTER — Telehealth: Payer: Self-pay

## 2020-12-12 NOTE — Telephone Encounter (Signed)
Patient returned call today and left a voicemail stating that she wanted to know what Dr. Burr Medico recommended about the blood in her stool. Patient states today that she has not had a bowel movement and does not know the color of her stool. When I called the patient back she did not answer, per DPR I left her a detailed voicemail advising her that Dr. Burr Medico is aware of the situation. I advised her that if it is just mild GI bleeding then she needs to monitor her symptoms and wait for her appointment on 9/1, however if her bleeding worsens then she needs to go to the ER. I asked the patient to return my call regarding her Amicar that Dr. Burr Medico wanted to make sure that she is taking.

## 2020-12-14 ENCOUNTER — Other Ambulatory Visit: Payer: Self-pay

## 2020-12-14 ENCOUNTER — Inpatient Hospital Stay: Payer: Medicaid Other | Attending: Hematology

## 2020-12-14 ENCOUNTER — Inpatient Hospital Stay: Payer: Medicaid Other

## 2020-12-14 DIAGNOSIS — I780 Hereditary hemorrhagic telangiectasia: Secondary | ICD-10-CM | POA: Diagnosis not present

## 2020-12-14 DIAGNOSIS — D500 Iron deficiency anemia secondary to blood loss (chronic): Secondary | ICD-10-CM

## 2020-12-14 DIAGNOSIS — Z95828 Presence of other vascular implants and grafts: Secondary | ICD-10-CM

## 2020-12-14 DIAGNOSIS — Z5112 Encounter for antineoplastic immunotherapy: Secondary | ICD-10-CM | POA: Diagnosis not present

## 2020-12-14 DIAGNOSIS — K922 Gastrointestinal hemorrhage, unspecified: Secondary | ICD-10-CM | POA: Diagnosis not present

## 2020-12-14 DIAGNOSIS — Z79899 Other long term (current) drug therapy: Secondary | ICD-10-CM | POA: Insufficient documentation

## 2020-12-14 LAB — CBC WITH DIFFERENTIAL (CANCER CENTER ONLY)
Abs Immature Granulocytes: 0.06 10*3/uL (ref 0.00–0.07)
Basophils Absolute: 0 10*3/uL (ref 0.0–0.1)
Basophils Relative: 0 %
Eosinophils Absolute: 0.1 10*3/uL (ref 0.0–0.5)
Eosinophils Relative: 1 %
HCT: 21.5 % — ABNORMAL LOW (ref 36.0–46.0)
Hemoglobin: 6.3 g/dL — CL (ref 12.0–15.0)
Immature Granulocytes: 1 %
Lymphocytes Relative: 12 %
Lymphs Abs: 1.4 10*3/uL (ref 0.7–4.0)
MCH: 27.2 pg (ref 26.0–34.0)
MCHC: 29.3 g/dL — ABNORMAL LOW (ref 30.0–36.0)
MCV: 92.7 fL (ref 80.0–100.0)
Monocytes Absolute: 0.5 10*3/uL (ref 0.1–1.0)
Monocytes Relative: 4 %
Neutro Abs: 10.1 10*3/uL — ABNORMAL HIGH (ref 1.7–7.7)
Neutrophils Relative %: 82 %
Platelet Count: 403 10*3/uL — ABNORMAL HIGH (ref 150–400)
RBC: 2.32 MIL/uL — ABNORMAL LOW (ref 3.87–5.11)
RDW: 21.8 % — ABNORMAL HIGH (ref 11.5–15.5)
WBC Count: 12.1 10*3/uL — ABNORMAL HIGH (ref 4.0–10.5)
nRBC: 0 % (ref 0.0–0.2)

## 2020-12-14 LAB — SAMPLE TO BLOOD BANK

## 2020-12-14 LAB — PREPARE RBC (CROSSMATCH)

## 2020-12-14 MED ORDER — SODIUM CHLORIDE 0.9% IV SOLUTION
250.0000 mL | Freq: Once | INTRAVENOUS | Status: AC
  Administered 2020-12-14: 250 mL via INTRAVENOUS

## 2020-12-14 MED ORDER — DIPHENHYDRAMINE HCL 25 MG PO CAPS
25.0000 mg | ORAL_CAPSULE | Freq: Once | ORAL | Status: AC
  Administered 2020-12-14: 25 mg via ORAL
  Filled 2020-12-14: qty 1

## 2020-12-14 MED ORDER — SODIUM CHLORIDE 0.9% FLUSH
10.0000 mL | Freq: Once | INTRAVENOUS | Status: AC
Start: 2020-12-14 — End: 2020-12-14
  Administered 2020-12-14: 10 mL

## 2020-12-14 MED ORDER — ACETAMINOPHEN 325 MG PO TABS
650.0000 mg | ORAL_TABLET | Freq: Once | ORAL | Status: AC
  Administered 2020-12-14: 650 mg via ORAL
  Filled 2020-12-14: qty 2

## 2020-12-14 NOTE — Patient Instructions (Signed)
Blood Transfusion, Adult, Care After This sheet gives you information about how to care for yourself after your procedure. Your doctor may also give you more specific instructions. If you have problems or questions, contact your doctor. What can I expect after the procedure? After the procedure, it is common to have: Bruising and soreness at the IV site. A headache. Follow these instructions at home: Insertion site care   Follow instructions from your doctor about how to take care of your insertion site. This is where an IV tube was put into your vein. Make sure you: Wash your hands with soap and water before and after you change your bandage (dressing). If you cannot use soap and water, use hand sanitizer. Change your bandage as told by your doctor. Check your insertion site every day for signs of infection. Check for: Redness, swelling, or pain. Bleeding from the site. Warmth. Pus or a bad smell. General instructions Take over-the-counter and prescription medicines only as told by your doctor. Rest as told by your doctor. Go back to your normal activities as told by your doctor. Keep all follow-up visits as told by your doctor. This is important. Contact a doctor if: You have itching or red, swollen areas of skin (hives). You feel worried or nervous (anxious). You feel weak after doing your normal activities. You have redness, swelling, warmth, or pain around the insertion site. You have blood coming from the insertion site, and the blood does not stop with pressure. You have pus or a bad smell coming from the insertion site. Get help right away if: You have signs of a serious reaction. This may be coming from an allergy or the body's defense system (immune system). Signs include: Trouble breathing or shortness of breath. Swelling of the face or feeling warm (flushed). Fever or chills. Head, chest, or back pain. Dark pee (urine) or blood in the pee. Widespread rash. Fast  heartbeat. Feeling dizzy or light-headed. You may receive your blood transfusion in an outpatient setting. If so, you will be told whom to contact to report any reactions. These symptoms may be an emergency. Do not wait to see if the symptoms will go away. Get medical help right away. Call your local emergency services (911 in the [place] Do not drive yourself to the hospital. Summary Bruising and soreness at the IV site are common. Check your insertion site every day for signs of infection. Rest as told by your doctor. Go back to your normal activities as told by your doctor. Get help right away if you have signs of a serious reaction. This information is not intended to replace advice given to you by your health care provider. Make sure you discuss any questions you have with your health care provider. Document Revised: 07/27/2020 Document Reviewed: 09/24/2018 Elsevier Patient Education  2022 [employer]  

## 2020-12-14 NOTE — Progress Notes (Signed)
Infusion nurse requested this nurse to reach out to provider to see if it was ok to run blood at a faster rate.  This nurse spoke with Cira Rue, NP  who gave permission to run the blood at 300 ml/ hr instead of 27m/hr.  Patient does not show history of heart failure.  No further questions or concerns at this time.

## 2020-12-15 LAB — TYPE AND SCREEN
ABO/RH(D): A NEG
Antibody Screen: NEGATIVE
Unit division: 0

## 2020-12-15 LAB — BPAM RBC
Blood Product Expiration Date: 202209272359
ISSUE DATE / TIME: 202209011636
Unit Type and Rh: 600

## 2020-12-20 ENCOUNTER — Other Ambulatory Visit: Payer: Self-pay | Admitting: Internal Medicine

## 2020-12-20 ENCOUNTER — Other Ambulatory Visit: Payer: Self-pay | Admitting: Hematology

## 2020-12-21 ENCOUNTER — Other Ambulatory Visit: Payer: Self-pay

## 2020-12-21 ENCOUNTER — Inpatient Hospital Stay: Payer: Medicaid Other

## 2020-12-21 ENCOUNTER — Encounter: Payer: Self-pay | Admitting: Hematology

## 2020-12-21 VITALS — BP 126/63 | HR 74 | Temp 98.6°F | Resp 18 | Ht 66.0 in | Wt 242.2 lb

## 2020-12-21 DIAGNOSIS — Z95828 Presence of other vascular implants and grafts: Secondary | ICD-10-CM

## 2020-12-21 DIAGNOSIS — R808 Other proteinuria: Secondary | ICD-10-CM

## 2020-12-21 DIAGNOSIS — I780 Hereditary hemorrhagic telangiectasia: Secondary | ICD-10-CM

## 2020-12-21 DIAGNOSIS — D500 Iron deficiency anemia secondary to blood loss (chronic): Secondary | ICD-10-CM

## 2020-12-21 DIAGNOSIS — D649 Anemia, unspecified: Secondary | ICD-10-CM

## 2020-12-21 DIAGNOSIS — Z5112 Encounter for antineoplastic immunotherapy: Secondary | ICD-10-CM | POA: Diagnosis not present

## 2020-12-21 LAB — CBC WITH DIFFERENTIAL (CANCER CENTER ONLY)
Abs Immature Granulocytes: 0.05 10*3/uL (ref 0.00–0.07)
Basophils Absolute: 0.1 10*3/uL (ref 0.0–0.1)
Basophils Relative: 0 %
Eosinophils Absolute: 0.1 10*3/uL (ref 0.0–0.5)
Eosinophils Relative: 1 %
HCT: 23.5 % — ABNORMAL LOW (ref 36.0–46.0)
Hemoglobin: 7.1 g/dL — ABNORMAL LOW (ref 12.0–15.0)
Immature Granulocytes: 0 %
Lymphocytes Relative: 11 %
Lymphs Abs: 1.3 10*3/uL (ref 0.7–4.0)
MCH: 27.2 pg (ref 26.0–34.0)
MCHC: 30.2 g/dL (ref 30.0–36.0)
MCV: 90 fL (ref 80.0–100.0)
Monocytes Absolute: 0.6 10*3/uL (ref 0.1–1.0)
Monocytes Relative: 5 %
Neutro Abs: 10.1 10*3/uL — ABNORMAL HIGH (ref 1.7–7.7)
Neutrophils Relative %: 83 %
Platelet Count: 347 10*3/uL (ref 150–400)
RBC: 2.61 MIL/uL — ABNORMAL LOW (ref 3.87–5.11)
RDW: 20.6 % — ABNORMAL HIGH (ref 11.5–15.5)
WBC Count: 12.2 10*3/uL — ABNORMAL HIGH (ref 4.0–10.5)
nRBC: 0 % (ref 0.0–0.2)

## 2020-12-21 LAB — TOTAL PROTEIN, URINE DIPSTICK: Protein, ur: 30 mg/dL — AB

## 2020-12-21 LAB — SAMPLE TO BLOOD BANK

## 2020-12-21 LAB — FERRITIN: Ferritin: 111 ng/mL (ref 11–307)

## 2020-12-21 MED ORDER — HEPARIN SOD (PORK) LOCK FLUSH 100 UNIT/ML IV SOLN
500.0000 [IU] | Freq: Once | INTRAVENOUS | Status: AC | PRN
  Administered 2020-12-21: 500 [IU]

## 2020-12-21 MED ORDER — SODIUM CHLORIDE 0.9% FLUSH
10.0000 mL | Freq: Once | INTRAVENOUS | Status: AC
  Administered 2020-12-21: 10 mL

## 2020-12-21 MED ORDER — SODIUM CHLORIDE 0.9 % IV SOLN
250.0000 mg | Freq: Once | INTRAVENOUS | Status: AC
  Administered 2020-12-21: 250 mg via INTRAVENOUS
  Filled 2020-12-21: qty 20

## 2020-12-21 MED ORDER — SODIUM CHLORIDE 0.9 % IV SOLN: Freq: Once | INTRAVENOUS | Status: AC

## 2020-12-21 MED ORDER — SODIUM CHLORIDE 0.9% FLUSH
10.0000 mL | INTRAVENOUS | Status: DC | PRN
  Administered 2020-12-21: 10 mL

## 2020-12-21 MED ORDER — SODIUM CHLORIDE 0.9 % IV SOLN
5.0000 mg/kg | Freq: Once | INTRAVENOUS | Status: AC
  Administered 2020-12-21: 500 mg via INTRAVENOUS
  Filled 2020-12-21: qty 16

## 2020-12-21 NOTE — Progress Notes (Signed)
HGB 7.1 and Urine protein 30.  Per MD ok to proceed with Bevacizumab.  Pt to get transfusion tomorrow or Saturday.

## 2020-12-21 NOTE — Patient Instructions (Signed)
[facility]  Discharge Instructions: Thank you for choosing [facility] to provide your oncology and hematology care.   If you have a lab appointment with the [facility], please go directly to the [facility] and check in at the registration area.   Wear comfortable clothing and clothing appropriate for easy access to any Portacath or PICC line.   We strive to give you quality time with your provider. You may need to reschedule your appointment if you arrive late (15 or more minutes).  Arriving late affects you and other patients whose appointments are after yours.  Also, if you miss three or more appointments without notifying the office, you may be dismissed from the clinic at the provider's discretion.      For prescription refill requests, have your pharmacy contact our office and allow 72 hours for refills to be completed.    Today you received the following chemotherapy and/or immunotherapy agents Ferrlecit and Bevacizumab      To help prevent nausea and vomiting after your treatment, we encourage you to take your nausea medication as directed.  BELOW ARE SYMPTOMS THAT SHOULD BE REPORTED IMMEDIATELY: *FEVER GREATER THAN 100.4 F (38 C) OR HIGHER *CHILLS OR SWEATING *NAUSEA AND VOMITING THAT IS NOT CONTROLLED WITH YOUR NAUSEA MEDICATION *UNUSUAL SHORTNESS OF BREATH *UNUSUAL BRUISING OR BLEEDING *URINARY PROBLEMS (pain or burning when urinating, or frequent urination) *BOWEL PROBLEMS (unusual diarrhea, constipation, pain near the anus) TENDERNESS IN MOUTH AND THROAT WITH OR WITHOUT PRESENCE OF ULCERS (sore throat, sores in mouth, or a toothache) UNUSUAL RASH, SWELLING OR PAIN  UNUSUAL VAGINAL DISCHARGE OR ITCHING   Items with * indicate a potential emergency and should be followed up as soon as possible or go to the Emergency Department if any problems should occur.  Please show the CHEMOTHERAPY ALERT CARD or IMMUNOTHERAPY ALERT CARD  at check-in to the Emergency Department and triage nurse.  Should you have questions after your visit or need to cancel or reschedule your appointment, please contact [facility]  Dept: [phone]  and follow the prompts.  Office hours are 8:00 a.m. to 4:30 p.m. Monday - Friday. Please note that voicemails left after 4:00 p.m. may not be returned until the following business day.  We are closed weekends and major holidays. You have access to a nurse at all times for urgent questions. Please call the main number to the clinic Dept: [phone] and follow the prompts.   For any non-urgent questions, you may also contact your provider using MyChart. We now offer e-Visits for anyone 15 and older to request care online for non-urgent symptoms. For details visit [email or web address].   Also download the MyChart app! Go to the app store, search "MyChart", open the app, select [facility], and log in with your MyChart username and password.  Due to Covid, a mask is required upon entering the hospital/clinic. If you do not have a mask, one will be given to you upon arrival. For doctor visits, patients may have 1 support person aged 50 or older with them. For treatment visits, patients cannot have anyone with them due to current Covid guidelines and our immunocompromised population.

## 2020-12-22 ENCOUNTER — Other Ambulatory Visit: Payer: Self-pay

## 2020-12-22 ENCOUNTER — Inpatient Hospital Stay: Payer: Medicaid Other

## 2020-12-22 VITALS — BP 130/64 | HR 71 | Temp 98.4°F | Resp 18

## 2020-12-22 DIAGNOSIS — Z5112 Encounter for antineoplastic immunotherapy: Secondary | ICD-10-CM | POA: Diagnosis not present

## 2020-12-22 DIAGNOSIS — D649 Anemia, unspecified: Secondary | ICD-10-CM

## 2020-12-22 DIAGNOSIS — Z95828 Presence of other vascular implants and grafts: Secondary | ICD-10-CM

## 2020-12-22 DIAGNOSIS — D500 Iron deficiency anemia secondary to blood loss (chronic): Secondary | ICD-10-CM

## 2020-12-22 LAB — PREPARE RBC (CROSSMATCH)

## 2020-12-22 MED ORDER — SODIUM CHLORIDE 0.9% IV SOLUTION
250.0000 mL | Freq: Once | INTRAVENOUS | Status: AC
  Administered 2020-12-22: 250 mL via INTRAVENOUS

## 2020-12-22 MED ORDER — SODIUM CHLORIDE 0.9% FLUSH
10.0000 mL | Freq: Once | INTRAVENOUS | Status: AC
  Administered 2020-12-22: 10 mL

## 2020-12-22 MED ORDER — HEPARIN SOD (PORK) LOCK FLUSH 100 UNIT/ML IV SOLN
500.0000 [IU] | Freq: Once | INTRAVENOUS | Status: AC
  Administered 2020-12-22: 500 [IU]

## 2020-12-22 NOTE — Patient Instructions (Signed)
Blood Transfusion, Adult, Care After This sheet gives you information about how to care for yourself after your procedure. Your doctor may also give you more specific instructions. If you have problems or questions, contact your doctor. What can I expect after the procedure? After the procedure, it is common to have: Bruising and soreness at the IV site. A headache. Follow these instructions at home: Insertion site care   Follow instructions from your doctor about how to take care of your insertion site. This is where an IV tube was put into your vein. Make sure you: Wash your hands with soap and water before and after you change your bandage (dressing). If you cannot use soap and water, use hand sanitizer. Change your bandage as told by your doctor. Check your insertion site every day for signs of infection. Check for: Redness, swelling, or pain. Bleeding from the site. Warmth. Pus or a bad smell. General instructions Take over-the-counter and prescription medicines only as told by your doctor. Rest as told by your doctor. Go back to your normal activities as told by your doctor. Keep all follow-up visits as told by your doctor. This is important. Contact a doctor if: You have itching or red, swollen areas of skin (hives). You feel worried or nervous (anxious). You feel weak after doing your normal activities. You have redness, swelling, warmth, or pain around the insertion site. You have blood coming from the insertion site, and the blood does not stop with pressure. You have pus or a bad smell coming from the insertion site. Get help right away if: You have signs of a serious reaction. This may be coming from an allergy or the body's defense system (immune system). Signs include: Trouble breathing or shortness of breath. Swelling of the face or feeling warm (flushed). Fever or chills. Head, chest, or back pain. Dark pee (urine) or blood in the pee. Widespread rash. Fast  heartbeat. Feeling dizzy or light-headed. You may receive your blood transfusion in an outpatient setting. If so, you will be told whom to contact to report any reactions. These symptoms may be an emergency. Do not wait to see if the symptoms will go away. Get medical help right away. Call your local emergency services (911 in the [place] Do not drive yourself to the hospital. Summary Bruising and soreness at the IV site are common. Check your insertion site every day for signs of infection. Rest as told by your doctor. Go back to your normal activities as told by your doctor. Get help right away if you have signs of a serious reaction. This information is not intended to replace advice given to you by your health care provider. Make sure you discuss any questions you have with your health care provider. Document Revised: 07/27/2020 Document Reviewed: 09/24/2018 Elsevier Patient Education  2022 [employer]  

## 2020-12-25 LAB — BPAM RBC
Blood Product Expiration Date: 202210012359
Blood Product Expiration Date: 202210012359
ISSUE DATE / TIME: 202209091417
Unit Type and Rh: 600
Unit Type and Rh: 600

## 2020-12-25 LAB — TYPE AND SCREEN
ABO/RH(D): A NEG
Antibody Screen: NEGATIVE
Unit division: 0
Unit division: 0

## 2021-01-01 ENCOUNTER — Ambulatory Visit: Payer: Medicaid Other | Admitting: Behavioral Health

## 2021-01-04 ENCOUNTER — Ambulatory Visit: Payer: Medicaid Other | Admitting: Hematology

## 2021-01-04 ENCOUNTER — Ambulatory Visit: Payer: Medicaid Other

## 2021-01-04 ENCOUNTER — Inpatient Hospital Stay: Payer: Medicaid Other

## 2021-01-04 DIAGNOSIS — D500 Iron deficiency anemia secondary to blood loss (chronic): Secondary | ICD-10-CM

## 2021-01-04 DIAGNOSIS — I780 Hereditary hemorrhagic telangiectasia: Secondary | ICD-10-CM

## 2021-01-09 DIAGNOSIS — N3941 Urge incontinence: Secondary | ICD-10-CM | POA: Diagnosis not present

## 2021-01-09 DIAGNOSIS — N319 Neuromuscular dysfunction of bladder, unspecified: Secondary | ICD-10-CM | POA: Diagnosis not present

## 2021-01-11 ENCOUNTER — Inpatient Hospital Stay: Payer: Medicaid Other | Admitting: Hematology

## 2021-01-11 ENCOUNTER — Inpatient Hospital Stay: Payer: Medicaid Other

## 2021-01-15 ENCOUNTER — Other Ambulatory Visit: Payer: Self-pay | Admitting: Hematology

## 2021-01-16 ENCOUNTER — Encounter: Payer: Self-pay | Admitting: Hematology

## 2021-01-16 ENCOUNTER — Other Ambulatory Visit: Payer: Self-pay

## 2021-01-16 DIAGNOSIS — F331 Major depressive disorder, recurrent, moderate: Secondary | ICD-10-CM

## 2021-01-18 ENCOUNTER — Ambulatory Visit: Payer: Medicaid Other | Admitting: Behavioral Health

## 2021-01-18 DIAGNOSIS — F419 Anxiety disorder, unspecified: Secondary | ICD-10-CM

## 2021-01-18 DIAGNOSIS — F4321 Adjustment disorder with depressed mood: Secondary | ICD-10-CM

## 2021-01-18 DIAGNOSIS — F331 Major depressive disorder, recurrent, moderate: Secondary | ICD-10-CM
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
## 2021-01-19 ENCOUNTER — Telehealth: Payer: Self-pay | Admitting: Hematology

## 2021-01-19 NOTE — Telephone Encounter (Signed)
Sch per 10/5 staff msg, left msg

## 2021-01-23 ENCOUNTER — Encounter: Payer: Self-pay | Admitting: Hematology

## 2021-01-23 ENCOUNTER — Ambulatory Visit: Payer: Medicaid Other

## 2021-01-23 ENCOUNTER — Other Ambulatory Visit: Payer: Medicaid Other

## 2021-01-31 ENCOUNTER — Inpatient Hospital Stay: Payer: Medicaid Other

## 2021-01-31 ENCOUNTER — Ambulatory Visit: Payer: Medicaid Other | Admitting: Hematology

## 2021-01-31 ENCOUNTER — Inpatient Hospital Stay: Payer: Medicaid Other | Attending: Hematology | Admitting: Hematology

## 2021-01-31 ENCOUNTER — Other Ambulatory Visit: Payer: Self-pay

## 2021-01-31 ENCOUNTER — Other Ambulatory Visit: Payer: Medicaid Other

## 2021-01-31 ENCOUNTER — Ambulatory Visit: Payer: Medicaid Other

## 2021-01-31 ENCOUNTER — Encounter: Payer: Self-pay | Admitting: Hematology

## 2021-01-31 ENCOUNTER — Telehealth: Payer: Self-pay

## 2021-01-31 VITALS — BP 131/60 | HR 67 | Temp 98.3°F | Resp 18 | Ht 66.0 in | Wt 238.2 lb

## 2021-01-31 DIAGNOSIS — D500 Iron deficiency anemia secondary to blood loss (chronic): Secondary | ICD-10-CM

## 2021-01-31 DIAGNOSIS — Z95828 Presence of other vascular implants and grafts: Secondary | ICD-10-CM

## 2021-01-31 DIAGNOSIS — Z79899 Other long term (current) drug therapy: Secondary | ICD-10-CM | POA: Insufficient documentation

## 2021-01-31 DIAGNOSIS — D649 Anemia, unspecified: Secondary | ICD-10-CM | POA: Diagnosis not present

## 2021-01-31 DIAGNOSIS — F331 Major depressive disorder, recurrent, moderate: Secondary | ICD-10-CM

## 2021-01-31 DIAGNOSIS — F17210 Nicotine dependence, cigarettes, uncomplicated: Secondary | ICD-10-CM | POA: Diagnosis not present

## 2021-01-31 DIAGNOSIS — I780 Hereditary hemorrhagic telangiectasia: Secondary | ICD-10-CM | POA: Diagnosis not present

## 2021-01-31 DIAGNOSIS — K922 Gastrointestinal hemorrhage, unspecified: Secondary | ICD-10-CM | POA: Diagnosis not present

## 2021-01-31 DIAGNOSIS — R808 Other proteinuria: Secondary | ICD-10-CM

## 2021-01-31 DIAGNOSIS — R040 Epistaxis: Secondary | ICD-10-CM

## 2021-01-31 DIAGNOSIS — Z5112 Encounter for antineoplastic immunotherapy: Secondary | ICD-10-CM | POA: Insufficient documentation

## 2021-01-31 LAB — CBC WITH DIFFERENTIAL (CANCER CENTER ONLY)
Abs Immature Granulocytes: 0.04 10*3/uL (ref 0.00–0.07)
Basophils Absolute: 0 10*3/uL (ref 0.0–0.1)
Basophils Relative: 0 %
Eosinophils Absolute: 0.1 10*3/uL (ref 0.0–0.5)
Eosinophils Relative: 1 %
HCT: 19.9 % — ABNORMAL LOW (ref 36.0–46.0)
Hemoglobin: 5.7 g/dL — CL (ref 12.0–15.0)
Immature Granulocytes: 0 %
Lymphocytes Relative: 13 %
Lymphs Abs: 1.5 10*3/uL (ref 0.7–4.0)
MCH: 23.5 pg — ABNORMAL LOW (ref 26.0–34.0)
MCHC: 28.6 g/dL — ABNORMAL LOW (ref 30.0–36.0)
MCV: 81.9 fL (ref 80.0–100.0)
Monocytes Absolute: 0.6 10*3/uL (ref 0.1–1.0)
Monocytes Relative: 5 %
Neutro Abs: 8.9 10*3/uL — ABNORMAL HIGH (ref 1.7–7.7)
Neutrophils Relative %: 81 %
Platelet Count: 284 10*3/uL (ref 150–400)
RBC: 2.43 MIL/uL — ABNORMAL LOW (ref 3.87–5.11)
RDW: 22.6 % — ABNORMAL HIGH (ref 11.5–15.5)
WBC Count: 11.1 10*3/uL — ABNORMAL HIGH (ref 4.0–10.5)
nRBC: 0 % (ref 0.0–0.2)

## 2021-01-31 LAB — IRON AND TIBC
Iron: 21 ug/dL — ABNORMAL LOW (ref 41–142)
Saturation Ratios: 8 % — ABNORMAL LOW (ref 21–57)
TIBC: 280 ug/dL (ref 236–444)
UIBC: 259 ug/dL (ref 120–384)

## 2021-01-31 LAB — TOTAL PROTEIN, URINE DIPSTICK: Protein, ur: 30 mg/dL — AB

## 2021-01-31 LAB — PREPARE RBC (CROSSMATCH)

## 2021-01-31 LAB — FERRITIN: Ferritin: 29 ng/mL (ref 11–307)

## 2021-01-31 MED ORDER — TRAZODONE HCL 100 MG PO TABS: ORAL_TABLET | ORAL | 2 refills | Status: DC

## 2021-01-31 MED ORDER — SODIUM CHLORIDE 0.9 % IV SOLN
250.0000 mg | Freq: Once | INTRAVENOUS | Status: DC
  Filled 2021-01-31: qty 20

## 2021-01-31 MED ORDER — AMINOCAPROIC ACID 1000 MG PO TABS: 1000.0000 mg | ORAL_TABLET | Freq: Two times a day (BID) | ORAL | 2 refills | Status: DC

## 2021-01-31 MED ORDER — SODIUM CHLORIDE 0.9% FLUSH
10.0000 mL | Freq: Once | INTRAVENOUS | Status: AC
  Administered 2021-01-31: 10 mL

## 2021-01-31 MED ORDER — SODIUM CHLORIDE 0.9 % IV SOLN
250.0000 mg | Freq: Once | INTRAVENOUS | Status: AC
  Administered 2021-01-31: 250 mg via INTRAVENOUS
  Filled 2021-01-31: qty 20

## 2021-01-31 MED ORDER — SODIUM CHLORIDE 0.9 % IV SOLN: Freq: Once | INTRAVENOUS | Status: DC

## 2021-01-31 MED ORDER — HEPARIN SOD (PORK) LOCK FLUSH 100 UNIT/ML IV SOLN
500.0000 [IU] | Freq: Once | INTRAVENOUS | Status: AC
Start: 2021-01-31 — End: 2021-01-31
  Administered 2021-01-31: 500 [IU]

## 2021-01-31 MED ORDER — SODIUM CHLORIDE 0.9 % IV SOLN
5.0000 mg/kg | Freq: Once | INTRAVENOUS | Status: AC
  Administered 2021-01-31: 500 mg via INTRAVENOUS
  Filled 2021-01-31: qty 4

## 2021-01-31 MED ORDER — ALPRAZOLAM 0.5 MG PO TABS: ORAL_TABLET | ORAL | 0 refills | Status: DC

## 2021-01-31 MED ORDER — SODIUM CHLORIDE 0.9 % IV SOLN: INTRAVENOUS | Status: DC

## 2021-01-31 NOTE — Addendum Note (Signed)
Addended by: Virgilio Frees on: 01/31/2021 03:41 PM   Modules accepted: Orders

## 2021-01-31 NOTE — Telephone Encounter (Signed)
CRITICAL VALUE STICKER  CRITICAL VALUE: HGB 5.7  RECEIVER (on-site recipient of call): Tambria Pfannenstiel P. LPN  DATE & TIME NOTIFIED: 01/31/21 1:37pm  MESSENGER (representative from lab): Ulice Dash  MD NOTIFIED: Cira Rue, NP  TIME OF NOTIFICATION: 1:40 pm  RESPONSE:  Get patient scheduled for 2 units of blood.

## 2021-01-31 NOTE — Progress Notes (Signed)
[facility]   [phone] Fax:([phone]) [phone]   Clinic Follow up Note   Patient Care Team: [name], MD as PCP - General  Date of Service:  01/31/2021  CHIEF COMPLAINT: f/u of HHT and anemia   PREVIOUS THERAPY: -Multiple EGD, small bowel enteroscope, C-scope with APC -Oral and iv amicar were given during hospital stay, no effect  -IR embolization of of the left gastric and short gastric artery branch vessels without evidence of residual flow in the Dieulafoy lesion on 12/10/17 -Avastin on 8/2 and 8/30 at [facility]; starting 12/26/17 continue 5 mg/kg q2 weeks until 02/20/18, restarted on 04/03/2018  -Tamoxifen started in 10/2017 for [place] stopped 12/10/18 due to DVT   CURRENT THERAPY:  -Blood transfusion for severe anemia secondary to GI bleeding as needed  -iv ferric gluconate every 1-2 weeks as needed with ferritin goal 100-200.  -Restarted Avastin/Zirabev q2weeks on 04/03/18. Given PRN due to proteinuria. -Amicar 1g bid starting 07/17/2018. Reduced to only as needed on 12/10/18 due to DVT. Restart daily on 06/16/20 due to increased bleeding. Increased to BID, then TID on 07/07/20.   ASSESSMENT & PLAN:  MEKIYAH GLADWELL is a 60 y.o. female with   1. 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  -She was admitted on 11/01/20 for symptomatic anemia following a syncopal episode, as well as hematochezia. 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 -Pt was hospitalized several  times from 1-10/2017 for severe recurrent GI bleeding from AVMs which required multiple blood transfusions and APCs. 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  -She is s/p IR embolization of the left Gastric and short gastric artery branch vessels without evidence of residual flow in the Dieulafoy lesion on 12/10/2017.  -She required cauterization for stomach for severe Gi bleeding in 11/2018, and epinephrine injection for bleeding ulcer on 11/03/20 -she continues on Amicar TID. -She has been on bevacizumab every 2-4 week, treatment has been intermittently held due to large proteinuria. She last received Beva on 12/21/20. We will obtain a urine sample today, if she is able.   3. B/l LE Edema -Stable. Continue compression socks and leg elevation  -[place] of leg found lymphadenopathy of b/l groin. She does have skin darkening of her legs which she has had in recent months.    4. Smoking cessation -She was smoking 10 cigarettes a day on average before recent hospitalization. She is smoking about 1/2 ppd (as of 08/03/20) -I strongly encouraged her to stop smoking completely. She tried previously but had trouble.   5. HTN, uncontrolled, Recently diagnosed DM -Continue medications. Will monitor on Avastin.   -Continue to f/u with PCP     PLAN:  -I will refill her amicar and trazadone (per pt request) -Proceed with IV ferric gluconate and Beva today -blood transfusion 02/02/21 -IV Ferric Gluconate 250mg  weekly x5 -Beva in 2 and 4 weeks -f/u in 4 weeks  *Ferric Gluconate 250mg  unless Ferritin >200 on  last lab  *beva if urine protein <=100 *blood transfusion if hg<8.0   No problem-specific Assessment & Plan notes found for this encounter.   INTERVAL HISTORY:  JAZELL ROSENAU is here for a follow up of HHT and anemia. She was last seen by me on 12/07/20. She was seen in the infusion area. I asked why she missed her recent  appointments. She denies any transportation issues; "I just got my days mixed up." She reports fatigue, which can limit her daily activities. She denies any chest pain.   All other systems were reviewed with the patient and are negative.  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COPY;  Service: Endoscopy;  Laterality: N/A;   DG TOES*L*  2/10   rt   DILATION AND CURETTAGE OF UTERUS     ENTEROSCOPY N/A 10/17/2017   Procedure: ENTEROSCOPY;  Surgeon: Otis Brace, MD;  Location: [facility];  Service: Gastroenterology;  Laterality: N/A;   ESOPHAGOGASTRODUODENOSCOPY N/A 04/10/2014   Procedure: ESOPHAGOGASTRODUODENOSCOPY (EGD);  Surgeon: [name], MD;  Location: [facility] ENDOSCOPY;  Service: Endoscopy;  Laterality: N/A;   ESOPHAGOGASTRODUODENOSCOPY N/A 05/10/2017   Procedure: ESOPHAGOGASTRODUODENOSCOPY (EGD);  Surgeon: Ronald Lobo, MD;  Location: [facility] ENDOSCOPY;  Service: Endoscopy;  Laterality: N/A;   ESOPHAGOGASTRODUODENOSCOPY N/A 09/22/2017   Procedure:  ESOPHAGOGASTRODUODENOSCOPY (EGD);  Surgeon: Clarene Essex, MD;  Location: [facility];  Service: Endoscopy;  Laterality: N/A;  bedside   ESOPHAGOGASTRODUODENOSCOPY N/A 03/02/2020   Procedure: ESOPHAGOGASTRODUODENOSCOPY (EGD);  Surgeon: Ronald Lobo, MD;  Location: [facility];  Service: Endoscopy;  Laterality: N/A;   ESOPHAGOGASTRODUODENOSCOPY N/A 11/03/2020   Procedure: ESOPHAGOGASTRODUODENOSCOPY (EGD);  Surgeon: [facility] Corner, MD;  Location: [facility];  Service: Endoscopy;  Laterality: N/A;   ESOPHAGOGASTRODUODENOSCOPY (EGD) WITH PROPOFOL N/A 04/27/2014   Procedure: ESOPHAGOGASTRODUODENOSCOPY (EGD) WITH PROPOFOL;  Surgeon: Cleotis Nipper, MD;  Location: [facility];  Service: Endoscopy;  Laterality: N/A;  possible apc   ESOPHAGOGASTRODUODENOSCOPY (EGD) WITH PROPOFOL N/A 09/30/2017   Procedure: ESOPHAGOGASTRODUODENOSCOPY (EGD) WITH PROPOFOL;  Surgeon: [name], MD;  Location: [facility];  Service: Gastroenterology;  Laterality: N/A;   ESOPHAGOGASTRODUODENOSCOPY (EGD) WITH PROPOFOL N/A 10/01/2017   Procedure: ESOPHAGOGASTRODUODENOSCOPY (EGD) WITH PROPOFOL;  Surgeon: [name], MD;  Location: [facility];  Service: Gastroenterology;  Laterality: N/A;   ESOPHAGOGASTRODUODENOSCOPY (EGD) WITH PROPOFOL N/A 10/08/2017   Procedure: ESOPHAGOGASTRODUODENOSCOPY (EGD) WITH PROPOFOL;  Surgeon: Otis Brace, MD;  Location: [facility];  Service: Gastroenterology;  Laterality: N/A;   ESOPHAGOGASTRODUODENOSCOPY (EGD) WITH PROPOFOL N/A 10/17/2017   Procedure: ESOPHAGOGASTRODUODENOSCOPY (EGD) WITH PROPOFOL;  Surgeon: Otis Brace,  MD;  Location: [facility];  Service: Gastroenterology;  Laterality: N/A;   ESOPHAGOGASTRODUODENOSCOPY (EGD) WITH PROPOFOL N/A 10/19/2017   Procedure: ESOPHAGOGASTRODUODENOSCOPY (EGD) WITH PROPOFOL;  Surgeon: Otis Brace, MD;  Location: [facility];  Service: Gastroenterology;  Laterality: N/A;   ESOPHAGOGASTRODUODENOSCOPY (EGD) WITH PROPOFOL N/A 12/04/2018    Procedure: ESOPHAGOGASTRODUODENOSCOPY (EGD) WITH PROPOFOL;  Surgeon: [facility] Corner, MD;  [facility];  Service: Endoscopy;  Laterality: N/A;   GIVENS CAPSULE STUDY N/A 10/02/2017   Procedure: GIVENS CAPSULE STUDY;  Surgeon: [name], MD;  Location: [facility];  Service: Gastroenterology;  Laterality: N/A;   GIVENS CAPSULE STUDY N/A 10/08/2017   Procedure: GIVENS CAPSULE STUDY;  Surgeon: Otis Brace, MD;  Location: [facility];  Service: Gastroenterology;  Laterality: N/A;  endoscopic placement of capsule   GIVENS CAPSULE STUDY N/A 03/02/2020   Procedure: GIVENS CAPSULE STUDY;  Surgeon: Ronald Lobo, MD;  Location: [facility];  Service: Endoscopy;  Laterality: N/A;   HEMOSTASIS CLIP PLACEMENT  12/04/2018   Procedure: HEMOSTASIS CLIP PLACEMENT;  Surgeon: [facility] Corner, MD;  Location: [facility];  Service: Endoscopy;;   HOT HEMOSTASIS N/A 04/27/2014   Procedure: HOT HEMOSTASIS (ARGON PLASMA COAGULATION/BICAP);  Surgeon: Cleotis Nipper, MD;  Location: [facility] ENDOSCOPY;  Service: Endoscopy;  Laterality: N/A;   HOT HEMOSTASIS N/A 09/30/2017   Procedure: HOT HEMOSTASIS (ARGON PLASMA COAGULATION/BICAP);  Surgeon: [name], MD;  Location: [facility];  Service: Gastroenterology;  Laterality: N/A;   HOT HEMOSTASIS N/A 10/01/2017   Procedure: HOT HEMOSTASIS (ARGON PLASMA COAGULATION/BICAP);  Surgeon: [name], MD;  Location: [facility];  Service: Gastroenterology;  Laterality: N/A;   HOT HEMOSTASIS N/A 10/17/2017   Procedure: HOT HEMOSTASIS (ARGON PLASMA COAGULATION/BICAP);  Surgeon: Otis Brace, MD;  Location: [facility] ENDOSCOPY;  Service: Gastroenterology;  Laterality: N/A;   HOT HEMOSTASIS N/A 10/19/2017   Procedure: HOT HEMOSTASIS (ARGON PLASMA COAGULATION/BICAP);  Surgeon: Otis Brace, MD;  Location[facility] ENDOSCOPY;  Service: Gastroenterology;  Laterality: N/A;   HOT HEMOSTASIS N/A 03/02/2020   Procedure: HOT HEMOSTASIS (ARGON PLASMA COAGULATION/BICAP);  Surgeon:  Ronald Lobo, MD;  Location: [facility];  Service: Endoscopy;  Laterality: N/A;   IR IMAGING GUIDED PORT INSERTION  07/08/2018   L shoulder Surgery  2011   POLYPECTOMY  03/02/2020   Procedure: POLYPECTOMY;  Surgeon: Ronald Lobo, MD;  Location: [facility]OS[facility];  Service: Endoscopy;;   SCLEROTHERAPY  11/03/2020   Procedure: Clide Deutscher;  Surgeon: [facility] Corner, MD;  Location: [facility] ENDOSCOPY;  Service: Endoscopy;;   SUBMUCOSAL INJECTION  09/22/2017   Procedure: SUBMUCOSAL INJECTION;  Surgeon: Clarene Essex, MD;  Location: [facility];  Service: Endoscopy;;   SUBMUCOSAL INJECTION  12/04/2018   Procedure: SUBMUCOSAL INJECTION;  Surgeon: [facility] Corner, MD;  Location: [facility]OS[facility];  Service: Endoscopy;;    I have reviewed the social history and family history with the patient and they are unchanged from previous note.  ALLERGIES:  is allergic to feraheme [ferumoxytol], nsaids, tomato, iron (ferrous sulfate) [ferrous sulfate er], other, and wasp venom.  MEDICATIONS:  Current Outpatient Medications  Medication Sig Dispense Refill   Accu-Chek Softclix Lancets lancets Use to check blood sugar before breakfast and before dinner while on steroids (Patient taking differently: 1 each by Other route See admin instructions. Use to check blood sugar before breakfast and before dinner while on steroids) 100 each 1   acitretin (SORIATANE) 10 MG capsule Take 10 mg by mouth daily.     ALPRAZolam (XANAX) 0.5 MG tablet TAKE 1 TABLET(0.5 MG) BY MOUTH AT BEDTIME AS NEEDED FOR ANXIETY 30  tablet 0   Aminocaproic Acid 1000 MG TABS Take 1 tablet (1,000 mg total) by mouth in the morning and at bedtime. 60 tablet 2   amLODipine (NORVASC) 10 MG tablet TAKE 1 TABLET(10 MG) BY MOUTH DAILY 90 tablet 0   atorvastatin (LIPITOR) 80 MG tablet TAKE 1 TABLET(80 MG) BY MOUTH DAILY 30 tablet 3   diphenoxylate-atropine (LOMOTIL) 2.5-0.025 MG tablet 1 to 2 PO QID prn diarrhea 30 tablet 1   furosemide (LASIX) 40 MG tablet Take  40 mg by mouth daily.     glucose blood (ACCU-CHEK GUIDE) test strip Check blood sugar 2 times per day while on steroids before breakfast and dinner (Patient taking differently: 1 each by Other route See admin instructions. Check blood sugar 2 times per day while on steroids before breakfast and dinner) 50 each 3   hydrochlorothiazide (HYDRODIURIL) 12.5 MG tablet TAKE 1 TABLET(12.5 MG) BY MOUTH DAILY 90 tablet 1   lidocaine-prilocaine (EMLA) cream Apply 1 application topically as needed. 30 g 0   metFORMIN (GLUCOPHAGE) 500 MG tablet TAKE 1 TABLET(500 MG) BY MOUTH TWICE DAILY WITH A MEAL 180 tablet 3   methocarbamol (ROBAXIN) 500 MG tablet Take 1 tablet (500 mg total) by mouth every 8 (eight) hours as needed for muscle spasms (back pain/spasm). 7 tablet 0   metoprolol succinate (TOPROL-XL) 100 MG 24 hr tablet Take 100 mg by mouth daily.     pantoprazole (PROTONIX) 40 MG tablet TAKE 1 TABLET(40 MG) BY MOUTH TWICE DAILY 60 tablet 5   Podiatric Products (FLEXITOL HEEL BALM) OINT Apply 1 application topically as needed (dry skin).      potassium chloride (KLOR-CON) 20 MEQ packet Take 20 mEq by mouth daily. 30 packet 1   PROAIR HFA 108 (90 Base) MCG/ACT inhaler INHALE 1 TO 2 PUFFS INTO THE LUNGS EVERY 6 HOURS AS NEEDED FOR WHEEZING OR SHORTNESS OF BREATH 8.5 g 0   prochlorperazine (COMPAZINE) 10 MG tablet Take 1 tablet (10 mg total) by mouth every 6 (six) hours as needed for nausea or vomiting. 30 tablet 1   sertraline (ZOLOFT) 25 MG tablet Take 5 tablets (125 mg total) by mouth daily. 150 tablet 2   terbinafine (LAMISIL AT) 1 % cream Apply 1 application topically 2 (two) times daily. Both bottom and top of both feet and toes 36 g 0   traMADol (ULTRAM) 50 MG tablet Take 1 tablet (50 mg total) by mouth daily as needed for severe pain. 7 tablet 0   traZODone (DESYREL) 100 MG tablet TAKE 1 TABLET(100 MG) BY MOUTH AT BEDTIME 30 tablet 2   No current facility-administered medications for this visit.    Facility-Administered Medications Ordered in Other Visits  Medication Dose Route Frequency Provider Last Rate Last Admin   sodium chloride flush (NS) 0.9 % injection 10 mL  10 mL Intracatheter PRN Truitt Merle, MD   10 mL at 08/14/20 1725    PHYSICAL EXAMINATION: ECOG PERFORMANCE STATUS: 2 - Symptomatic, <50% confined to bed  There were no vitals filed for this visit. Wt Readings from Last 3 Encounters:  01/31/21 238 lb 4 oz (108.1 kg)  12/21/20 242 lb 4 oz (109.9 kg)  11/23/20 239 lb (108.4 kg)     GENERAL:alert, no distress and comfortable SKIN: skin color, texture, turgor are normal, no rashes or significant lesions EYES: normal, Conjunctiva are pink and non-injected, sclera clear OROPHARYNX:no exudate, no erythema and lips, buccal mucosa, and tongue normal except multiple pink spots  NEURO: alert & oriented  x 3 with fluent speech, no focal motor/sensory deficits  LABORATORY DATA:  I have reviewed the data as listed CBC Latest Ref Rng & Units 01/31/2021 12/21/2020 12/14/2020  WBC 4.0 - 10.5 K/uL 11.1(H) 12.2(H) 12.1(H)  Hemoglobin 12.0 - 15.0 g/dL 5.7(LL) 7.1(L) 6.3(LL)  Hematocrit 36.0 - 46.0 % 19.9(L) 23.5(L) 21.5(L)  Platelets 150 - 400 K/uL 284 347 403(H)     CMP Latest Ref Rng & Units 12/07/2020 11/05/2020 11/04/2020  Glucose 70 - 99 mg/dL 116(H) 91 119(H)  BUN 6 - 20 mg/dL 13 5(L) 6  Creatinine 0.44 - 1.00 mg/dL 1.21(H) 0.71 0.78  Sodium 135 - 145 mmol/L 136 138 140  Potassium 3.5 - 5.1 mmol/L 3.4(L) 3.8 3.6  Chloride 98 - 111 mmol/L 103 105 108  CO2 22 - 32 mmol/L 24 25 25   Calcium 8.9 - 10.3 mg/dL 9.1 8.9 9.1  Total Protein 6.5 - 8.1 g/dL 7.3 - -  Total Bilirubin 0.3 - 1.2 mg/dL 0.5 - -  Alkaline Phos 38 - 126 U/L 72 - -  AST 15 - 41 U/L 13(L) - -  ALT 0 - 44 U/L 6 - -      RADIOGRAPHIC STUDIES: I have personally reviewed the radiological images as listed and agreed with the findings in the report. No results found.    Orders Placed This Encounter   Procedures   Prepare RBC (crossmatch)    Standing Status:   Standing    Number of Occurrences:   1    Order Specific Question:   # of Units    Answer:   2 units    Order Specific Question:   Transfusion Indications    Answer:   Symptomatic Anemia    Order Specific Question:   Number of Units to Keep Ahead    Answer:   NO units ahead    Order Specific Question:   Instructions:    Answer:   Transfuse    Order Specific Question:   If emergent release call blood bank    Answer:   [facility] [phone]   All questions were answered. The patient knows to call the clinic with any problems, questions or concerns. No barriers to learning was detected. The total time spent in the appointment was 30 minutes.     Truitt Merle, MD 01/31/2021   I, Wilburn Mylar, am acting as scribe for Truitt Merle, MD.   I have reviewed the above documentation for accuracy and completeness, and I agree with the above.

## 2021-01-31 NOTE — Patient Instructions (Signed)
[facility]  Discharge Instructions: Thank you for choosing [facility] to provide your oncology and hematology care.   If you have a lab appointment with the [facility], please go directly to the [facility] and check in at the registration area.   Wear comfortable clothing and clothing appropriate for easy access to any Portacath or PICC line.   We strive to give you quality time with your provider. You may need to reschedule your appointment if you arrive late (15 or more minutes).  Arriving late affects you and other patients whose appointments are after yours.  Also, if you miss three or more appointments without notifying the office, you may be dismissed from the clinic at the provider's discretion.      For prescription refill requests, have your pharmacy contact our office and allow 72 hours for refills to be completed.    Today you received the following chemotherapy and/or immunotherapy agents Ferrlecit and Bevacizumab      To help prevent nausea and vomiting after your treatment, we encourage you to take your nausea medication as directed.  BELOW ARE SYMPTOMS THAT SHOULD BE REPORTED IMMEDIATELY: *FEVER GREATER THAN 100.4 F (38 C) OR HIGHER *CHILLS OR SWEATING *NAUSEA AND VOMITING THAT IS NOT CONTROLLED WITH YOUR NAUSEA MEDICATION *UNUSUAL SHORTNESS OF BREATH *UNUSUAL BRUISING OR BLEEDING *URINARY PROBLEMS (pain or burning when urinating, or frequent urination) *BOWEL PROBLEMS (unusual diarrhea, constipation, pain near the anus) TENDERNESS IN MOUTH AND THROAT WITH OR WITHOUT PRESENCE OF ULCERS (sore throat, sores in mouth, or a toothache) UNUSUAL RASH, SWELLING OR PAIN  UNUSUAL VAGINAL DISCHARGE OR ITCHING   Items with * indicate a potential emergency and should be followed up as soon as possible or go to the Emergency Department if any problems should occur.  Please show the CHEMOTHERAPY ALERT CARD or IMMUNOTHERAPY ALERT CARD  at check-in to the Emergency Department and triage nurse.  Should you have questions after your visit or need to cancel or reschedule your appointment, please contact [facility]  Dept: [phone]  and follow the prompts.  Office hours are 8:00 a.m. to 4:30 p.m. Monday - Friday. Please note that voicemails left after 4:00 p.m. may not be returned until the following business day.  We are closed weekends and major holidays. You have access to a nurse at all times for urgent questions. Please call the main number to the clinic Dept: [phone] and follow the prompts.   For any non-urgent questions, you may also contact your provider using MyChart. We now offer e-Visits for anyone 15 and older to request care online for non-urgent symptoms. For details visit [email or web address].   Also download the MyChart app! Go to the app store, search "MyChart", open the app, select [facility], and log in with your MyChart username and password.  Due to Covid, a mask is required upon entering the hospital/clinic. If you do not have a mask, one will be given to you upon arrival. For doctor visits, patients may have 1 support person aged 50 or older with them. For treatment visits, patients cannot have anyone with them due to current Covid guidelines and our immunocompromised population.

## 2021-02-01 ENCOUNTER — Telehealth: Payer: Self-pay | Admitting: Hematology

## 2021-02-01 NOTE — Telephone Encounter (Signed)
Scheduled follow-up appointments per 10/19 los. Patient is aware. 

## 2021-02-02 ENCOUNTER — Inpatient Hospital Stay: Payer: Medicaid Other

## 2021-02-02 ENCOUNTER — Other Ambulatory Visit: Payer: Self-pay

## 2021-02-02 ENCOUNTER — Other Ambulatory Visit: Payer: Self-pay | Admitting: *Deleted

## 2021-02-02 ENCOUNTER — Telehealth: Payer: Self-pay | Admitting: *Deleted

## 2021-02-02 DIAGNOSIS — D649 Anemia, unspecified: Secondary | ICD-10-CM

## 2021-02-02 DIAGNOSIS — Z5112 Encounter for antineoplastic immunotherapy: Secondary | ICD-10-CM | POA: Diagnosis not present

## 2021-02-02 DIAGNOSIS — D500 Iron deficiency anemia secondary to blood loss (chronic): Secondary | ICD-10-CM

## 2021-02-02 MED ORDER — DIPHENHYDRAMINE HCL 25 MG PO CAPS
25.0000 mg | ORAL_CAPSULE | Freq: Once | ORAL | Status: AC
  Administered 2021-02-02: 25 mg via ORAL
  Filled 2021-02-02: qty 1

## 2021-02-02 MED ORDER — HEPARIN SOD (PORK) LOCK FLUSH 100 UNIT/ML IV SOLN
250.0000 [IU] | INTRAVENOUS | Status: AC | PRN
  Administered 2021-02-02: 500 [IU]

## 2021-02-02 MED ORDER — ACETAMINOPHEN 325 MG PO TABS
650.0000 mg | ORAL_TABLET | Freq: Once | ORAL | Status: AC
  Administered 2021-02-02: 650 mg via ORAL
  Filled 2021-02-02: qty 2

## 2021-02-02 MED ORDER — SODIUM CHLORIDE 0.9% IV SOLUTION
250.0000 mL | Freq: Once | INTRAVENOUS | Status: AC
  Administered 2021-02-02: 250 mL via INTRAVENOUS

## 2021-02-02 MED ORDER — SODIUM CHLORIDE 0.9% FLUSH
10.0000 mL | INTRAVENOUS | Status: AC | PRN
  Administered 2021-02-02: 10 mL

## 2021-02-02 NOTE — Telephone Encounter (Signed)
Pt has not shown for appt for blood transfusion today.  Called & she answered & was at home & son was at an appt & didn't know when he would be out.  She states  she tried to call us but couldn't get through.  She is going to call her other son to see if he can bring her.  Informed to let us know if unable to get here.  She expressed understanding.

## 2021-02-02 NOTE — Progress Notes (Signed)
Per Dr. Burr Medico, okay to increase rate of blood to 363ml/hr.

## 2021-02-02 NOTE — Patient Instructions (Signed)
Blood Transfusion, Adult, Care After This sheet gives you information about how to care for yourself after your procedure. Your doctor may also give you more specific instructions. If you have problems or questions, contact your doctor. What can I expect after the procedure? After the procedure, it is common to have: Bruising and soreness at the IV site. A headache. Follow these instructions at home: Insertion site care   Follow instructions from your doctor about how to take care of your insertion site. This is where an IV tube was put into your vein. Make sure you: Wash your hands with soap and water before and after you change your bandage (dressing). If you cannot use soap and water, use hand sanitizer. Change your bandage as told by your doctor. Check your insertion site every day for signs of infection. Check for: Redness, swelling, or pain. Bleeding from the site. Warmth. Pus or a bad smell. General instructions Take over-the-counter and prescription medicines only as told by your doctor. Rest as told by your doctor. Go back to your normal activities as told by your doctor. Keep all follow-up visits as told by your doctor. This is important. Contact a doctor if: You have itching or red, swollen areas of skin (hives). You feel worried or nervous (anxious). You feel weak after doing your normal activities. You have redness, swelling, warmth, or pain around the insertion site. You have blood coming from the insertion site, and the blood does not stop with pressure. You have pus or a bad smell coming from the insertion site. Get help right away if: You have signs of a serious reaction. This may be coming from an allergy or the body's defense system (immune system). Signs include: Trouble breathing or shortness of breath. Swelling of the face or feeling warm (flushed). Fever or chills. Head, chest, or back pain. Dark pee (urine) or blood in the pee. Widespread rash. Fast  heartbeat. Feeling dizzy or light-headed. You may receive your blood transfusion in an outpatient setting. If so, you will be told whom to contact to report any reactions. These symptoms may be an emergency. Do not wait to see if the symptoms will go away. Get medical help right away. Call your local emergency services (911 in the [place] Do not drive yourself to the hospital. Summary Bruising and soreness at the IV site are common. Check your insertion site every day for signs of infection. Rest as told by your doctor. Go back to your normal activities as told by your doctor. Get help right away if you have signs of a serious reaction. This information is not intended to replace advice given to you by your health care provider. Make sure you discuss any questions you have with your health care provider. Document Revised: 07/27/2020 Document Reviewed: 09/24/2018 Elsevier Patient Education  2022 [employer]  

## 2021-02-05 LAB — BPAM RBC
Blood Product Expiration Date: 202211142359
Blood Product Expiration Date: 202211152359
ISSUE DATE / TIME: 202210211411
ISSUE DATE / TIME: 202210211547
Unit Type and Rh: 600
Unit Type and Rh: 600

## 2021-02-05 LAB — TYPE AND SCREEN
ABO/RH(D): A NEG
Antibody Screen: NEGATIVE
Unit division: 0
Unit division: 0

## 2021-02-07 ENCOUNTER — Other Ambulatory Visit: Payer: Medicaid Other

## 2021-02-07 ENCOUNTER — Inpatient Hospital Stay: Payer: Medicaid Other

## 2021-02-07 ENCOUNTER — Ambulatory Visit: Payer: Medicaid Other

## 2021-02-07 ENCOUNTER — Telehealth: Payer: Self-pay

## 2021-02-07 ENCOUNTER — Other Ambulatory Visit: Payer: Self-pay

## 2021-02-07 ENCOUNTER — Other Ambulatory Visit: Payer: Self-pay | Admitting: Hematology

## 2021-02-07 ENCOUNTER — Ambulatory Visit: Payer: Medicaid Other | Admitting: Behavioral Health

## 2021-02-07 VITALS — BP 136/68 | HR 79 | Temp 98.8°F | Resp 16 | Wt 235.0 lb

## 2021-02-07 DIAGNOSIS — D500 Iron deficiency anemia secondary to blood loss (chronic): Secondary | ICD-10-CM

## 2021-02-07 DIAGNOSIS — I780 Hereditary hemorrhagic telangiectasia: Secondary | ICD-10-CM

## 2021-02-07 DIAGNOSIS — D649 Anemia, unspecified: Secondary | ICD-10-CM

## 2021-02-07 DIAGNOSIS — F331 Major depressive disorder, recurrent, moderate: Secondary | ICD-10-CM

## 2021-02-07 DIAGNOSIS — Z95828 Presence of other vascular implants and grafts: Secondary | ICD-10-CM

## 2021-02-07 DIAGNOSIS — F4321 Adjustment disorder with depressed mood: Secondary | ICD-10-CM

## 2021-02-07 DIAGNOSIS — F419 Anxiety disorder, unspecified: Secondary | ICD-10-CM

## 2021-02-07 DIAGNOSIS — Z5112 Encounter for antineoplastic immunotherapy: Secondary | ICD-10-CM | POA: Diagnosis not present

## 2021-02-07 LAB — CBC WITH DIFFERENTIAL (CANCER CENTER ONLY)
Abs Immature Granulocytes: 0.07 10*3/uL (ref 0.00–0.07)
Basophils Absolute: 0 10*3/uL (ref 0.0–0.1)
Basophils Relative: 0 %
Eosinophils Absolute: 0.1 10*3/uL (ref 0.0–0.5)
Eosinophils Relative: 1 %
HCT: 23.7 % — ABNORMAL LOW (ref 36.0–46.0)
Hemoglobin: 6.9 g/dL — CL (ref 12.0–15.0)
Immature Granulocytes: 1 %
Lymphocytes Relative: 10 %
Lymphs Abs: 1 10*3/uL (ref 0.7–4.0)
MCH: 24.7 pg — ABNORMAL LOW (ref 26.0–34.0)
MCHC: 29.1 g/dL — ABNORMAL LOW (ref 30.0–36.0)
MCV: 84.9 fL (ref 80.0–100.0)
Monocytes Absolute: 0.6 10*3/uL (ref 0.1–1.0)
Monocytes Relative: 6 %
Neutro Abs: 8.6 10*3/uL — ABNORMAL HIGH (ref 1.7–7.7)
Neutrophils Relative %: 82 %
Platelet Count: 271 10*3/uL (ref 150–400)
RBC: 2.79 MIL/uL — ABNORMAL LOW (ref 3.87–5.11)
RDW: 22.5 % — ABNORMAL HIGH (ref 11.5–15.5)
WBC Count: 10.5 10*3/uL (ref 4.0–10.5)
nRBC: 0.2 % (ref 0.0–0.2)

## 2021-02-07 MED ORDER — SODIUM CHLORIDE 0.9 % IV SOLN: Freq: Once | INTRAVENOUS | Status: AC

## 2021-02-07 MED ORDER — SODIUM CHLORIDE 0.9 % IV SOLN: 5.0000 mg/kg | Freq: Once | INTRAVENOUS | Status: DC

## 2021-02-07 MED ORDER — SODIUM CHLORIDE 0.9 % IV SOLN
250.0000 mg | Freq: Once | INTRAVENOUS | Status: AC
  Administered 2021-02-07: 250 mg via INTRAVENOUS
  Filled 2021-02-07: qty 20

## 2021-02-07 MED ORDER — SODIUM CHLORIDE 0.9% FLUSH
10.0000 mL | INTRAVENOUS | Status: DC | PRN
  Administered 2021-02-07: 10 mL

## 2021-02-07 MED ORDER — HEPARIN SOD (PORK) LOCK FLUSH 100 UNIT/ML IV SOLN
500.0000 [IU] | Freq: Once | INTRAVENOUS | Status: AC | PRN
  Administered 2021-02-07: 500 [IU]

## 2021-02-07 MED ORDER — SODIUM CHLORIDE 0.9% FLUSH
10.0000 mL | Freq: Once | INTRAVENOUS | Status: AC
  Administered 2021-02-07: 10 mL

## 2021-02-07 NOTE — Telephone Encounter (Signed)
CRITICAL VALUE STICKER  CRITICAL VALUE:  HGB  6.9  RECEIVER (on-site recipient of call): Ladarren Steiner. P LPN  DATE & TIME NOTIFIED: 02/07/21 12:45 pm  MESSENGER (representative from lab): Ulice Dash  MD NOTIFIED: Cira Rue, NP   TIME OF NOTIFICATION: 3435 PM
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
## 2021-02-07 NOTE — Patient Instructions (Signed)
[facility] ONCOLOGY  Discharge Instructions: Thank you for choosing [facility] to provide your oncology and hematology care.   If you have a lab appointment with the [facility], please go directly to the [facility] and check in at the registration area.   Wear comfortable clothing and clothing appropriate for easy access to any Portacath or PICC line.   We strive to give you quality time with your provider. You may need to reschedule your appointment if you arrive late (15 or more minutes).  Arriving late affects you and other patients whose appointments are after yours.  Also, if you miss three or more appointments without notifying the office, you may be dismissed from the clinic at the provider's discretion.      For prescription refill requests, have your pharmacy contact our office and allow 72 hours for refills to be completed.    Today you received the following chemotherapy and/or immunotherapy agents Ferrlicet      To help prevent nausea and vomiting after your treatment, we encourage you to take your nausea medication as directed.  BELOW ARE SYMPTOMS THAT SHOULD BE REPORTED IMMEDIATELY: *FEVER GREATER THAN 100.4 F (38 C) OR HIGHER *CHILLS OR SWEATING *NAUSEA AND VOMITING THAT IS NOT CONTROLLED WITH YOUR NAUSEA MEDICATION *UNUSUAL SHORTNESS OF BREATH *UNUSUAL BRUISING OR BLEEDING *URINARY PROBLEMS (pain or burning when urinating, or frequent urination) *BOWEL PROBLEMS (unusual diarrhea, constipation, pain near the anus) TENDERNESS IN MOUTH AND THROAT WITH OR WITHOUT PRESENCE OF ULCERS (sore throat, sores in mouth, or a toothache) UNUSUAL RASH, SWELLING OR PAIN  UNUSUAL VAGINAL DISCHARGE OR ITCHING   Items with * indicate a potential emergency and should be followed up as soon as possible or go to the Emergency Department if any problems should occur.  Please show the CHEMOTHERAPY ALERT CARD or IMMUNOTHERAPY ALERT CARD at check-in to  the Emergency Department and triage nurse.  Should you have questions after your visit or need to cancel or reschedule your appointment, please contact [facility]  Dept: [phone]  and follow the prompts.  Office hours are 8:00 a.m. to 4:30 p.m. Monday - Friday. Please note that voicemails left after 4:00 p.m. may not be returned until the following business day.  We are closed weekends and major holidays. You have access to a nurse at all times for urgent questions. Please call the main number to the clinic Dept: [phone] and follow the prompts.   For any non-urgent questions, you may also contact your provider using MyChart. We now offer e-Visits for anyone 1 and older to request care online for non-urgent symptoms. For details visit [email or web address].   Also download the MyChart app! Go to the app store, search "MyChart", open the app, select [facility], and log in with your MyChart username and password.  Due to Covid, a mask is required upon entering the hospital/clinic. If you do not have a mask, one will be given to you upon arrival. For doctor visits, patients may have 1 support person aged 60 or older with them. For treatment visits, patients cannot have anyone with them due to current Covid guidelines and our immunocompromised population.

## 2021-02-08 DIAGNOSIS — N3941 Urge incontinence: Secondary | ICD-10-CM | POA: Diagnosis not present

## 2021-02-08 DIAGNOSIS — N319 Neuromuscular dysfunction of bladder, unspecified: Secondary | ICD-10-CM | POA: Diagnosis not present

## 2021-02-09 ENCOUNTER — Other Ambulatory Visit: Payer: Self-pay

## 2021-02-09 ENCOUNTER — Inpatient Hospital Stay: Payer: Medicaid Other

## 2021-02-09 ENCOUNTER — Telehealth: Payer: Self-pay

## 2021-02-09 DIAGNOSIS — D649 Anemia, unspecified: Secondary | ICD-10-CM

## 2021-02-09 DIAGNOSIS — Z5112 Encounter for antineoplastic immunotherapy: Secondary | ICD-10-CM | POA: Diagnosis not present

## 2021-02-09 DIAGNOSIS — D500 Iron deficiency anemia secondary to blood loss (chronic): Secondary | ICD-10-CM

## 2021-02-09 DIAGNOSIS — I780 Hereditary hemorrhagic telangiectasia: Secondary | ICD-10-CM

## 2021-02-09 DIAGNOSIS — Z95828 Presence of other vascular implants and grafts: Secondary | ICD-10-CM

## 2021-02-09 LAB — CBC WITH DIFFERENTIAL (CANCER CENTER ONLY)
Abs Immature Granulocytes: 0.05 10*3/uL (ref 0.00–0.07)
Basophils Absolute: 0 10*3/uL (ref 0.0–0.1)
Basophils Relative: 0 %
Eosinophils Absolute: 0.1 10*3/uL (ref 0.0–0.5)
Eosinophils Relative: 1 %
HCT: 24.2 % — ABNORMAL LOW (ref 36.0–46.0)
Hemoglobin: 7.1 g/dL — ABNORMAL LOW (ref 12.0–15.0)
Immature Granulocytes: 1 %
Lymphocytes Relative: 14 %
Lymphs Abs: 1.2 10*3/uL (ref 0.7–4.0)
MCH: 24.9 pg — ABNORMAL LOW (ref 26.0–34.0)
MCHC: 29.3 g/dL — ABNORMAL LOW (ref 30.0–36.0)
MCV: 84.9 fL (ref 80.0–100.0)
Monocytes Absolute: 0.6 10*3/uL (ref 0.1–1.0)
Monocytes Relative: 7 %
Neutro Abs: 6.6 10*3/uL (ref 1.7–7.7)
Neutrophils Relative %: 77 %
Platelet Count: 234 10*3/uL (ref 150–400)
RBC: 2.85 MIL/uL — ABNORMAL LOW (ref 3.87–5.11)
RDW: 23 % — ABNORMAL HIGH (ref 11.5–15.5)
WBC Count: 8.6 10*3/uL (ref 4.0–10.5)
nRBC: 0.3 % — ABNORMAL HIGH (ref 0.0–0.2)

## 2021-02-09 LAB — CMP (CANCER CENTER ONLY)
ALT: <5 U/L (ref 0–44)
AST: 12 U/L — ABNORMAL LOW (ref 15–41)
Albumin: 3.4 g/dL — ABNORMAL LOW (ref 3.5–5.0)
Alkaline Phosphatase: 84 U/L (ref 38–126)
Anion gap: 10 (ref 5–15)
BUN: 8 mg/dL (ref 6–20)
CO2: 25 mmol/L (ref 22–32)
Calcium: 9.1 mg/dL (ref 8.9–10.3)
Chloride: 106 mmol/L (ref 98–111)
Creatinine: 0.83 mg/dL (ref 0.44–1.00)
GFR, Estimated: >60 mL/min (ref >60)
Glucose, Bld: 105 mg/dL — ABNORMAL HIGH (ref 70–99)
Potassium: 3.3 mmol/L — ABNORMAL LOW (ref 3.5–5.1)
Sodium: 141 mmol/L (ref 135–145)
Total Bilirubin: 0.3 mg/dL (ref 0.3–1.2)
Total Protein: 7 g/dL (ref 6.5–8.1)

## 2021-02-09 LAB — PREPARE RBC (CROSSMATCH)

## 2021-02-09 MED ORDER — HEPARIN SOD (PORK) LOCK FLUSH 100 UNIT/ML IV SOLN
500.0000 [IU] | Freq: Once | INTRAVENOUS | Status: AC
  Administered 2021-02-09: 500 [IU]

## 2021-02-09 MED ORDER — SODIUM CHLORIDE 0.9% FLUSH
10.0000 mL | Freq: Once | INTRAVENOUS | Status: AC
  Administered 2021-02-09: 10 mL

## 2021-02-09 NOTE — Telephone Encounter (Signed)
This nurse spoke with patient and asked if she could come in today to have her labs and Type and Screen drawn.  Patient states that she could be here between 130 and 2 pm.  This nurse explained to patient that these labs are important in order to receive her transfusion on 10/29.  Patient acknowledge understanding.  Reminder her of chair time of 9 am on 10/29.  She stated that she will be here.  No further questions or concerns noted at this time.

## 2021-02-10 ENCOUNTER — Other Ambulatory Visit: Payer: Self-pay

## 2021-02-10 ENCOUNTER — Inpatient Hospital Stay: Payer: Medicaid Other

## 2021-02-10 ENCOUNTER — Other Ambulatory Visit: Payer: Self-pay | Admitting: Nurse Practitioner

## 2021-02-10 DIAGNOSIS — D649 Anemia, unspecified: Secondary | ICD-10-CM

## 2021-02-10 DIAGNOSIS — Z5112 Encounter for antineoplastic immunotherapy: Secondary | ICD-10-CM | POA: Diagnosis not present

## 2021-02-10 DIAGNOSIS — I10 Essential (primary) hypertension: Secondary | ICD-10-CM

## 2021-02-10 MED ORDER — SODIUM CHLORIDE 0.9% FLUSH
10.0000 mL | INTRAVENOUS | Status: AC | PRN
  Administered 2021-02-10: 10 mL

## 2021-02-10 MED ORDER — HEPARIN SOD (PORK) LOCK FLUSH 100 UNIT/ML IV SOLN
500.0000 [IU] | Freq: Every day | INTRAVENOUS | Status: AC | PRN
  Administered 2021-02-10: 500 [IU]

## 2021-02-10 MED ORDER — DIPHENHYDRAMINE HCL 25 MG PO CAPS
25.0000 mg | ORAL_CAPSULE | Freq: Once | ORAL | Status: AC
  Administered 2021-02-10: 25 mg via ORAL

## 2021-02-10 MED ORDER — SODIUM CHLORIDE 0.9% IV SOLUTION
250.0000 mL | Freq: Once | INTRAVENOUS | Status: AC
  Administered 2021-02-10: 250 mL via INTRAVENOUS

## 2021-02-10 MED ORDER — ACETAMINOPHEN 325 MG PO TABS
650.0000 mg | ORAL_TABLET | Freq: Once | ORAL | Status: AC
  Administered 2021-02-10: 650 mg via ORAL

## 2021-02-10 MED ORDER — DIPHENHYDRAMINE HCL 25 MG PO CAPS
ORAL_CAPSULE | ORAL | Status: AC
  Filled 2021-02-10: qty 1

## 2021-02-10 NOTE — Patient Instructions (Signed)
Blood Transfusion, Adult, Care After This sheet gives you information about how to care for yourself after your procedure. Your doctor may also give you more specific instructions. If you have problems or questions, contact your doctor. What can I expect after the procedure? After the procedure, it is common to have: Bruising and soreness at the IV site. A headache. Follow these instructions at home: Insertion site care   Follow instructions from your doctor about how to take care of your insertion site. This is where an IV tube was put into your vein. Make sure you: Wash your hands with soap and water before and after you change your bandage (dressing). If you cannot use soap and water, use hand sanitizer. Change your bandage as told by your doctor. Check your insertion site every day for signs of infection. Check for: Redness, swelling, or pain. Bleeding from the site. Warmth. Pus or a bad smell. General instructions Take over-the-counter and prescription medicines only as told by your doctor. Rest as told by your doctor. Go back to your normal activities as told by your doctor. Keep all follow-up visits as told by your doctor. This is important. Contact a doctor if: You have itching or red, swollen areas of skin (hives). You feel worried or nervous (anxious). You feel weak after doing your normal activities. You have redness, swelling, warmth, or pain around the insertion site. You have blood coming from the insertion site, and the blood does not stop with pressure. You have pus or a bad smell coming from the insertion site. Get help right away if: You have signs of a serious reaction. This may be coming from an allergy or the body's defense system (immune system). Signs include: Trouble breathing or shortness of breath. Swelling of the face or feeling warm (flushed). Fever or chills. Head, chest, or back pain. Dark pee (urine) or blood in the pee. Widespread rash. Fast  heartbeat. Feeling dizzy or light-headed. You may receive your blood transfusion in an outpatient setting. If so, you will be told whom to contact to report any reactions. These symptoms may be an emergency. Do not wait to see if the symptoms will go away. Get medical help right away. Call your local emergency services (911 in the [place] Do not drive yourself to the hospital. Summary Bruising and soreness at the IV site are common. Check your insertion site every day for signs of infection. Rest as told by your doctor. Go back to your normal activities as told by your doctor. Get help right away if you have signs of a serious reaction. This information is not intended to replace advice given to you by your health care provider. Make sure you discuss any questions you have with your health care provider. Document Revised: 07/27/2020 Document Reviewed: 09/24/2018 Elsevier Patient Education  2022 [employer]  

## 2021-02-12 LAB — TYPE AND SCREEN
ABO/RH(D): A NEG
Antibody Screen: NEGATIVE
Unit division: 0
Unit division: 0

## 2021-02-12 LAB — BPAM RBC
Blood Product Expiration Date: 202211202359
Blood Product Expiration Date: 202211212359
ISSUE DATE / TIME: 202210290936
ISSUE DATE / TIME: 202210290954
Unit Type and Rh: 600
Unit Type and Rh: 600

## 2021-02-13 ENCOUNTER — Encounter: Payer: Self-pay | Admitting: Hematology

## 2021-02-15 ENCOUNTER — Other Ambulatory Visit: Payer: Medicaid Other

## 2021-02-15 ENCOUNTER — Ambulatory Visit: Payer: Medicaid Other

## 2021-02-22 ENCOUNTER — Other Ambulatory Visit: Payer: Self-pay

## 2021-02-22 ENCOUNTER — Encounter: Payer: Self-pay | Admitting: Hematology

## 2021-02-22 ENCOUNTER — Inpatient Hospital Stay: Payer: Medicaid Other | Attending: Hematology

## 2021-02-22 ENCOUNTER — Inpatient Hospital Stay: Payer: Medicaid Other

## 2021-02-22 DIAGNOSIS — Z5112 Encounter for antineoplastic immunotherapy: Secondary | ICD-10-CM | POA: Diagnosis present

## 2021-02-22 DIAGNOSIS — R040 Epistaxis: Secondary | ICD-10-CM | POA: Insufficient documentation

## 2021-02-22 DIAGNOSIS — Z95828 Presence of other vascular implants and grafts: Secondary | ICD-10-CM

## 2021-02-22 DIAGNOSIS — D500 Iron deficiency anemia secondary to blood loss (chronic): Secondary | ICD-10-CM | POA: Insufficient documentation

## 2021-02-22 DIAGNOSIS — K922 Gastrointestinal hemorrhage, unspecified: Secondary | ICD-10-CM | POA: Diagnosis not present

## 2021-02-22 DIAGNOSIS — I780 Hereditary hemorrhagic telangiectasia: Secondary | ICD-10-CM | POA: Insufficient documentation

## 2021-02-22 LAB — CBC WITH DIFFERENTIAL (CANCER CENTER ONLY)
Abs Immature Granulocytes: 0.05 10*3/uL (ref 0.00–0.07)
Basophils Absolute: 0.1 10*3/uL (ref 0.0–0.1)
Basophils Relative: 0 %
Eosinophils Absolute: 0.1 10*3/uL (ref 0.0–0.5)
Eosinophils Relative: 1 %
HCT: 30.7 % — ABNORMAL LOW (ref 36.0–46.0)
Hemoglobin: 9 g/dL — ABNORMAL LOW (ref 12.0–15.0)
Immature Granulocytes: 0 %
Lymphocytes Relative: 13 %
Lymphs Abs: 1.6 10*3/uL (ref 0.7–4.0)
MCH: 25.2 pg — ABNORMAL LOW (ref 26.0–34.0)
MCHC: 29.3 g/dL — ABNORMAL LOW (ref 30.0–36.0)
MCV: 86 fL (ref 80.0–100.0)
Monocytes Absolute: 0.5 10*3/uL (ref 0.1–1.0)
Monocytes Relative: 4 %
Neutro Abs: 10.4 10*3/uL — ABNORMAL HIGH (ref 1.7–7.7)
Neutrophils Relative %: 82 %
Platelet Count: 312 10*3/uL (ref 150–400)
RBC: 3.57 MIL/uL — ABNORMAL LOW (ref 3.87–5.11)
RDW: 21.2 % — ABNORMAL HIGH (ref 11.5–15.5)
WBC Count: 12.8 10*3/uL — ABNORMAL HIGH (ref 4.0–10.5)
nRBC: 0 % (ref 0.0–0.2)

## 2021-02-22 LAB — SAMPLE TO BLOOD BANK

## 2021-02-22 MED ORDER — HEPARIN SOD (PORK) LOCK FLUSH 100 UNIT/ML IV SOLN
500.0000 [IU] | Freq: Once | INTRAVENOUS | Status: AC
  Administered 2021-02-22: 500 [IU]

## 2021-02-22 MED ORDER — SODIUM CHLORIDE 0.9% FLUSH
10.0000 mL | Freq: Once | INTRAVENOUS | Status: AC
  Administered 2021-02-22: 10 mL

## 2021-02-23 LAB — IRON AND TIBC
Iron: 54 ug/dL (ref 41–142)
Saturation Ratios: 22 % (ref 21–57)
TIBC: 251 ug/dL (ref 236–444)
UIBC: 197 ug/dL (ref 120–384)

## 2021-02-23 LAB — FERRITIN: Ferritin: 73 ng/mL (ref 11–307)

## 2021-02-28 ENCOUNTER — Ambulatory Visit: Payer: Medicaid Other | Admitting: Behavioral Health

## 2021-03-01 ENCOUNTER — Other Ambulatory Visit: Payer: Self-pay

## 2021-03-01 ENCOUNTER — Inpatient Hospital Stay (HOSPITAL_BASED_OUTPATIENT_CLINIC_OR_DEPARTMENT_OTHER): Payer: Medicaid Other | Admitting: Hematology

## 2021-03-01 ENCOUNTER — Telehealth: Payer: Self-pay | Admitting: Internal Medicine

## 2021-03-01 ENCOUNTER — Encounter: Payer: Self-pay | Admitting: Hematology

## 2021-03-01 ENCOUNTER — Inpatient Hospital Stay: Payer: Medicaid Other

## 2021-03-01 VITALS — BP 137/69 | HR 80 | Temp 98.5°F | Resp 16 | Wt 228.5 lb

## 2021-03-01 DIAGNOSIS — D500 Iron deficiency anemia secondary to blood loss (chronic): Secondary | ICD-10-CM

## 2021-03-01 DIAGNOSIS — D62 Acute posthemorrhagic anemia: Secondary | ICD-10-CM

## 2021-03-01 DIAGNOSIS — Z5112 Encounter for antineoplastic immunotherapy: Secondary | ICD-10-CM | POA: Diagnosis not present

## 2021-03-01 DIAGNOSIS — F331 Major depressive disorder, recurrent, moderate: Secondary | ICD-10-CM | POA: Diagnosis not present

## 2021-03-01 DIAGNOSIS — I780 Hereditary hemorrhagic telangiectasia: Secondary | ICD-10-CM

## 2021-03-01 DIAGNOSIS — Z95828 Presence of other vascular implants and grafts: Secondary | ICD-10-CM

## 2021-03-01 DIAGNOSIS — Q2733 Arteriovenous malformation of digestive system vessel: Secondary | ICD-10-CM

## 2021-03-01 DIAGNOSIS — I10 Essential (primary) hypertension: Secondary | ICD-10-CM | POA: Diagnosis not present

## 2021-03-01 DIAGNOSIS — D649 Anemia, unspecified: Secondary | ICD-10-CM | POA: Diagnosis not present

## 2021-03-01 LAB — CBC WITH DIFFERENTIAL (CANCER CENTER ONLY)
Abs Immature Granulocytes: 0.03 10*3/uL (ref 0.00–0.07)
Basophils Absolute: 0.1 10*3/uL (ref 0.0–0.1)
Basophils Relative: 1 %
Eosinophils Absolute: 0.1 10*3/uL (ref 0.0–0.5)
Eosinophils Relative: 1 %
HCT: 27 % — ABNORMAL LOW (ref 36.0–46.0)
Hemoglobin: 7.9 g/dL — ABNORMAL LOW (ref 12.0–15.0)
Immature Granulocytes: 0 %
Lymphocytes Relative: 12 %
Lymphs Abs: 1.5 10*3/uL (ref 0.7–4.0)
MCH: 25.1 pg — ABNORMAL LOW (ref 26.0–34.0)
MCHC: 29.3 g/dL — ABNORMAL LOW (ref 30.0–36.0)
MCV: 85.7 fL (ref 80.0–100.0)
Monocytes Absolute: 0.6 10*3/uL (ref 0.1–1.0)
Monocytes Relative: 5 %
Neutro Abs: 10.5 10*3/uL — ABNORMAL HIGH (ref 1.7–7.7)
Neutrophils Relative %: 81 %
Platelet Count: 328 10*3/uL (ref 150–400)
RBC: 3.15 MIL/uL — ABNORMAL LOW (ref 3.87–5.11)
RDW: 21.4 % — ABNORMAL HIGH (ref 11.5–15.5)
WBC Count: 12.8 10*3/uL — ABNORMAL HIGH (ref 4.0–10.5)
nRBC: 0 % (ref 0.0–0.2)

## 2021-03-01 LAB — TOTAL PROTEIN, URINE DIPSTICK: Protein, ur: 30 mg/dL — AB

## 2021-03-01 LAB — SAMPLE TO BLOOD BANK

## 2021-03-01 MED ORDER — HEPARIN SOD (PORK) LOCK FLUSH 100 UNIT/ML IV SOLN
500.0000 [IU] | Freq: Once | INTRAVENOUS | Status: AC | PRN
  Administered 2021-03-01: 18:00:00 500 [IU]

## 2021-03-01 MED ORDER — SODIUM CHLORIDE 0.9% FLUSH
10.0000 mL | INTRAVENOUS | Status: DC | PRN
  Administered 2021-03-01: 18:00:00 10 mL

## 2021-03-01 MED ORDER — SODIUM CHLORIDE 0.9 % IV SOLN
250.0000 mg | Freq: Once | INTRAVENOUS | Status: DC
  Filled 2021-03-01: qty 20

## 2021-03-01 MED ORDER — HEPARIN SOD (PORK) LOCK FLUSH 100 UNIT/ML IV SOLN: 500.0000 [IU] | Freq: Once | INTRAVENOUS | Status: DC

## 2021-03-01 MED ORDER — SODIUM CHLORIDE 0.9% FLUSH: 10.0000 mL | Freq: Once | INTRAVENOUS | Status: DC

## 2021-03-01 MED ORDER — TRAZODONE HCL 100 MG PO TABS: ORAL_TABLET | ORAL | 2 refills | Status: DC

## 2021-03-01 MED ORDER — SODIUM CHLORIDE 0.9 % IV SOLN
5.0000 mg/kg | Freq: Once | INTRAVENOUS | Status: AC
  Administered 2021-03-01: 18:00:00 500 mg via INTRAVENOUS
  Filled 2021-03-01: qty 4

## 2021-03-01 MED ORDER — ALPRAZOLAM 0.5 MG PO TABS: ORAL_TABLET | ORAL | 0 refills | Status: DC

## 2021-03-01 MED ORDER — SODIUM CHLORIDE 0.9 % IV SOLN: Freq: Once | INTRAVENOUS | Status: AC

## 2021-03-01 MED ORDER — AMLODIPINE BESYLATE 10 MG PO TABS: 10.0000 mg | ORAL_TABLET | Freq: Every day | ORAL | 0 refills | Status: DC

## 2021-03-01 MED ORDER — SODIUM CHLORIDE 0.9 % IV SOLN
250.0000 mg | Freq: Once | INTRAVENOUS | Status: AC
  Administered 2021-03-01: 15:00:00 250 mg via INTRAVENOUS
  Filled 2021-03-01: qty 20

## 2021-03-01 NOTE — Progress Notes (Signed)
Per Dr. Burr Medico, "OK To Treat with Hbg 7.5 and urine protein of 30".  Pt to get 1unit of PRBCs on Saturday.  Infusion RN to obtain T&S and Sample to blood bank.

## 2021-03-01 NOTE — Patient Instructions (Signed)
[facility] ONCOLOG[facility]  Discharge Instructions: Thank you for choosing [facility] to provide your oncology and hematology care.   If you have a lab appointment with the [facility], please go directly to the [facility] and check in at the registration area.   Wear comfortable clothing and clothing appropriate for easy access to any Portacath or PICC line.   We strive to give you quality time with your provider. You may need to reschedule your appointment if you arrive late (15 or more minutes).  Arriving late affects you and other patients whose appointments are after yours.  Also, if you miss three or more appointments without notifying the office, you may be dismissed from the clinic at the provider's discretion.      For prescription refill requests, have your pharmacy contact our office and allow 72 hours for refills to be completed.    Today you received the following chemotherapy and/or immunotherapy agent: Bevacizumab (Avastin).     To help prevent nausea and vomiting after your treatment, we encourage you to take your nausea medication as directed.  BELOW ARE SYMPTOMS THAT SHOULD BE REPORTED IMMEDIATELY: *FEVER GREATER THAN 100.4 F (38 C) OR HIGHER *CHILLS OR SWEATING *NAUSEA AND VOMITING THAT IS NOT CONTROLLED WITH YOUR NAUSEA MEDICATION *UNUSUAL SHORTNESS OF BREATH *UNUSUAL BRUISING OR BLEEDING *URINARY PROBLEMS (pain or burning when urinating, or frequent urination) *BOWEL PROBLEMS (unusual diarrhea, constipation, pain near the anus) TENDERNESS IN MOUTH AND THROAT WITH OR WITHOUT PRESENCE OF ULCERS (sore throat, sores in mouth, or a toothache) UNUSUAL RASH, SWELLING OR PAIN  UNUSUAL VAGINAL DISCHARGE OR ITCHING   Items with * indicate a potential emergency and should be followed up as soon as possible or go to the Emergency Department if any problems should occur.  Please show the CHEMOTHERAPY ALERT CARD or IMMUNOTHERAPY ALERT CARD at  check-in to the Emergency Department and triage nurse.  Should you have questions after your visit or need to cancel or reschedule your appointment, please contact [facility]  Dept: [phone]  and follow the prompts.  Office hours are 8:00 a.m. to 4:30 p.m. Monday - Friday. Please note that voicemails left after 4:00 p.m. may not be returned until the following business day.  We are closed weekends and major holidays. You have access to a nurse at all times for urgent questions. Please call the main number to the clinic Dept: [phone] and follow the prompts.   For any non-urgent questions, you may also contact your provider using MyChart. We now offer e-Visits for anyone 24 and older to request care online for non-urgent symptoms. For details visit [email or web address].   Also download the MyChart app! Go to the app store, search "MyChart", open the app, select [facility], and log in with your MyChart username and password.  Due to Covid, a mask is required upon entering the hospital/clinic. If you do not have a mask, one will be given to you upon arrival. For doctor visits, patients may have 1 support person aged 28 or older with them. For treatment visits, patients cannot have anyone with them due to current Covid guidelines and our immunocompromised population.   Sodium Ferric Gluconate Complex Injection What is this medication? SODIUM FERRIC GLUCONATE COMPLEX (SOE dee um FER ik GLOO koe nate KOM pleks) treats low levels of iron (iron deficiency anemia) in people with kidney disease. Iron is a mineral that plays an important role in making red blood  cells, which carry oxygen from your lungs to the rest of your body. This medicine may be used for other purposes; ask your health care provider or pharmacist if you have questions. COMMON BRAND NAME(S): Ferrlecit, Nulecit What should I tell my care team before I take this medication? They need to know if you  have any of the following conditions: Anemia that is not from iron deficiency High levels of iron in the blood An unusual or allergic reaction to iron, other medications, foods, dyes, or preservatives Pregnant or are trying to become pregnant Breast-feeding How should I use this medication? This medication is injected into a vein. It is given by your care team in a hospital or clinic setting. Talk to your care team about the use of this medication in children. While it may be prescribed for children as young as 6 years for selected conditions, precautions do apply. Overdosage: If you think you have taken too much of this medicine contact a poison control center or emergency room at once. NOTE: This medicine is only for you. Do not share this medicine with others. What if I miss a dose? It is important not to miss your dose. Call your care team if you are unable to keep an appointment. What may interact with this medication? Do not take this medication with any of the following: Deferasirox Deferoxamine Dimercaprol This medication may also interact with the following: Other iron products This list may not describe all possible interactions. Give your health care provider a list of all the medicines, herbs, non-prescription drugs, or dietary supplements you use. Also tell them if you smoke, drink alcohol, or use illegal drugs. Some items may interact with your medicine. What should I watch for while using this medication? Your condition will be monitored carefully while you are receiving this medication. Visit your care team for regular checks on your progress. You may need blood work while you are taking this medication. What side effects may I notice from receiving this medication? Side effects that you should report to your care team as soon as possible: Allergic reactions--skin rash, itching, hives, swelling of the face, lips, tongue, or throat Low blood pressure--dizziness, feeling  faint or lightheaded, blurry vision Shortness of breath Side effects that usually do not require medical attention (report to your care team if they continue or are bothersome): Flushing Headache Joint pain Muscle pain Nausea Pain, redness, or irritation at injection site This list may not describe all possible side effects. Call your doctor for medical advice about side effects. You may report side effects to FDA at 1-800-FDA-1088. Where should I keep my medication? This medication is given in a hospital or clinic and will not be stored at home. NOTE: This sheet is a summary. It may not cover all possible information. If you have questions about this medicine, talk to your doctor, pharmacist, or health care provider.  2022 Elsevier/Gold Standard (2020-08-25 00:00:00)

## 2021-03-01 NOTE — Progress Notes (Signed)
Per Dr. Burr Medico, ok for treatment today with HGB 7.9

## 2021-03-01 NOTE — Progress Notes (Signed)
[place]   Telephone:([phone]) [phone] [phone]) [phone]   Clinic Follow up Note   Patient Care Team: [name], MD as PCP - General  Date of Service:  03/01/2021  CHIEF COMPLAINT: f/u of HHT and anemia  PREVIOUS THERAPY: -Multiple EGD, small bowel enteroscope, C-scope with APC -Oral and iv amicar were given during hospital stay, no effect  -IR embolization of of the left gastric and short gastric artery branch vessels without evidence of residual flow in the Dieulafoy lesion on 12/10/17 -Avastin on 8/2 and 8/30 at [facility]; starting 12/26/17 continue 5 mg/kg q2 weeks until 02/20/18, restarted on 04/03/2018  -Tamoxifen started in 10/2017 for [place] stopped 12/10/18 due to DVT   CURRENT THERAPY:  -Blood transfusion for severe anemia with Hg<8.0 secondary to GI bleeding as needed  -iv ferric gluconate every 1-2 weeks as needed with ferritin goal 100-200.  -Restarted Avastin/Zirabev q2weeks on 04/03/18. Given PRN due to proteinuria. -Amicar 1g bid starting 07/17/2018. Reduced to only as needed on 12/10/18 due to DVT. Restart daily on 06/16/20 due to increased bleeding. Increased to BID, then TID on 07/07/20.  ASSESSMENT & PLAN:  Peggy House is a 60 y.o. female with   1. 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  -She was admitted on 11/01/20 for symptomatic anemia following a syncopal episode, as well as hematochezia. -Hgb 7.9 today (03/01/21) -Follow-up in 4 weeks   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 -Pt was hospitalized several times from 1-10/2017  for severe recurrent GI bleeding from AVMs which required multiple blood transfusions and APCs. 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  -She is s/p IR embolization of the left Gastric and short gastric artery branch vessels without evidence of residual flow in the Dieulafoy lesion on 12/10/2017.  -She required cauterization for stomach for severe Gi bleeding in 11/2018, and epinephrine injection for bleeding ulcer on 11/03/20 -she continues on Amicar TID. -She has been on bevacizumab every 2-4 week, treatment has been intermittently held due to large proteinuria. She last received Beva on 12/21/20. We will obtain a urine sample today, if she is able.   3. B/l LE Edema -Stable. Continue compression socks and leg elevation  -[place] of leg found lymphadenopathy of b/l groin. She does have skin darkening of her legs which she has had in recent months.    4. Smoking cessation -She was smoking 10 cigarettes a day on average before recent hospitalization. She is smoking about 1/2 ppd (as of 08/03/20) -I strongly encouraged her to stop smoking completely. She tried previously but had trouble.   5. HTN, Recently diagnosed DM, Anxiety/Depression -Continue medications. Will monitor on Avastin.   -Continue to f/u with PCP -she requested a number of refills today. I advised her to speak with her PCP. I did refill her amlodipine, trazadone, and Xanax.     PLAN:  -I will refill her amlodipine, Xanax, and trazadone (per pt request) -Proceed with IV ferric gluconate and Beva today -Ferric gluconate twice next week then weekly  -blood transfusion 02/02/21,  1u  -Beva in 2 and 4 weeks -f/u in 4 weeks   *Ferric Gluconate 250mg  unless Ferritin >200 on last lab  *beva if urine protein <=100 *blood transfusion if hg<8.0   No problem-specific Assessment & Plan notes found for this encounter.   INTERVAL HISTORY:  Peggy House is  here for a follow up of HHT and anemia. She was last seen by me on 01/31/21. She was seen in the infusion area. She is hoarse today. She reports productive cough with dark mucus and sneezing. She denies fever.  She reports continued nose bleeds. She denies GI bleeding.   All other systems were reviewed with the patient and are negative.  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    SURGICAL HISTORY: Past Surgical History:  Procedure Laterality Date   ABDOMINAL HYSTERECTOMY     CARPAL TUNNEL RELEASE  05/13/2011   Procedure: CARPAL TUNNEL RELEASE;  Surgeon: Nita Sells, MD;  Location: [place];  Service: Orthopedics;  Laterality: Left;   COLONOSCOPY N/A 03/02/2020   Procedure: COLONOSCOPY;  Surgeon: Ronald Lobo, MD;  Location: [facility];  Service: Endoscopy;  Laterality: N/A;   COLONOSCOPY WITH PROPOFOL N/A 04/28/2014   Procedure: COLONOSCOPY WITH PROPOFOL;  Surgeon: Cleotis Nipper, MD;  Location: [facility] ENDOSCOPY;  Service: Endoscopy;  Laterality: N/A;   DG TOES*L*  2/10   rt   DILATION AND CURETTAGE OF UTERUS     ENTEROSCOPY N/A 10/17/2017   Procedure: ENTEROSCOPY;  Surgeon: Otis Brace, MD;  Location: [place];  Service: Gastroenterology;  Laterality: N/A;   ESOPHAGOGASTRODUODENOSCOPY N/A 04/10/2014   Procedure: ESOPHAGOGASTRODUODENOSCOPY (EGD);  Surgeon: [name], MD;  Location: [facility] ENDOSCOPY;  Service: Endoscopy;  Laterality: N/A;   ESOPHAGOGASTRODUODENOSCOPY N/A 05/10/2017   Procedure: ESOPHAGOGASTRODUODENOSCOPY (EGD);  Surgeon: Ronald Lobo, MD;  Location: [facility] ENDOSCOPY;   Service: Endoscopy;  Laterality: N/A;   ESOPHAGOGASTRODUODENOSCOPY N/A 09/22/2017   Procedure: ESOPHAGOGASTRODUODENOSCOPY (EGD);  Surgeon: Clarene Essex, MD;  Location: [place];  Service: Endoscopy;  Laterality: N/A;  bedside   ESOPHAGOGASTRODUODENOSCOPY N/A 03/02/2020   Procedure: ESOPHAGOGASTRODUODENOSCOPY (EGD);  Surgeon: Ronald Lobo, MD;  Location: [facility];  Service: Endoscopy;  Laterality: N/A;   ESOPHAGOGASTRODUODENOSCOPY N/A 11/03/2020   Procedure: ESOPHAGOGASTRODUODENOSCOPY (EGD);  Surgeon: Wilford Corner, MD;  Location: [place];  Service: Endoscopy;  Laterality: N/A;   ESOPHAGOGASTRODUODENOSCOPY (EGD) WITH PROPOFOL N/A 04/27/2014   Procedure: ESOPHAGOGASTRODUODENOSCOPY (EGD) WITH PROPOFOL;  Surgeon: Cleotis Nipper, MD;  Location: [place];  Service: Endoscopy;  Laterality: N/A;  possible apc   ESOPHAGOGASTRODUODENOSCOPY (EGD) WITH PROPOFOL N/A 09/30/2017   Procedure: ESOPHAGOGASTRODUODENOSCOPY (EGD) WITH PROPOFOL;  Surgeon: [name], MD;  Location: [place];  Service: Gastroenterology;  Laterality: N/A;   ESOPHAGOGASTRODUODENOSCOPY (EGD) WITH PROPOFOL N/A 10/01/2017   Procedure: ESOPHAGOGASTRODUODENOSCOPY (EGD) WITH PROPOFOL;  Surgeon: [name], MD;  Location: [place];  Service: Gastroenterology;  Laterality: N/A;   ESOPHAGOGASTRODUODENOSCOPY (EGD) WITH PROPOFOL N/A 10/08/2017   Procedure: ESOPHAGOGASTRODUODENOSCOPY (EGD) WITH PROPOFOL;  Surgeon: Otis Brace, MD;  Location: [place];  Service: Gastroenterology;  Laterality: N/A;   ESOPHAGOGASTRODUODENOSCOPY (EGD) WITH PROPOFOL  N/A 10/17/2017   Procedure: ESOPHAGOGASTRODUODENOSCOPY (EGD) WITH PROPOFOL;  Surgeon: Otis Brace, MD;  Location: [facility] ENDOSCOPY;  Service: Gastroenterology;  Laterality: N/A;   ESOPHAGOGASTRODUODENOSCOPY (EGD) WITH PROPOFOL N/A 10/19/2017   Procedure: ESOPHAGOGASTRODUODENOSCOPY (EGD) WITH PROPOFOL;  Surgeon: Otis Brace, MD;  Location: [place];  Service:  Gastroenterology;  Laterality: N/A;   ESOPHAGOGASTRODUODENOSCOPY (EGD) WITH PROPOFOL N/A 12/04/2018   Procedure: ESOPHAGOGASTRODUODENOSCOPY (EGD) WITH PROPOFOL;  Surgeon: Wilford Corner, MD;  Location: [facility];  Service: Endoscopy;  Laterality: N/A;   GIVENS CAPSULE STUDY N/A 10/02/2017   Procedure: GIVENS CAPSULE STUDY;  Surgeon: [name], MD;  Location: [place];  Service: Gastroenterology;  Laterality: N/A;   GIVENS CAPSULE STUDY N/A 10/08/2017   Procedure: GIVENS CAPSULE STUDY;  Surgeon: Otis Brace, MD;  Location: [place];  Service: Gastroenterology;  Laterality: N/A;  endoscopic placement of capsule   GIVENS CAPSULE STUDY N/A 03/02/2020   Procedure: GIVENS CAPSULE STUDY;  Surgeon: Ronald Lobo, MD;  Location[facility];  Service: Endoscopy;  Laterality: N/A;   HEMOSTASIS CLIP PLACEMENT  12/04/2018   Procedure: HEMOSTASIS CLIP PLACEMENT;  Surgeon: Wilford Corner, MD;  Location: [facility];  Service: Endoscopy;;   HOT HEMOSTASIS N/A 04/27/2014   Procedure: HOT HEMOSTASIS (ARGON PLASMA COAGULATION/BICAP);  Surgeon: Cleotis Nipper, MD;  Location: [facility] ENDOSCOPY;  Service: Endoscopy;  Laterality: N/A;   HOT HEMOSTASIS N/A 09/30/2017   Procedure: HOT HEMOSTASIS (ARGON PLASMA COAGULATION/BICAP);  Surgeon: [name], MD;  Location: [place];  Service: Gastroenterology;  Laterality: N/A;   HOT HEMOSTASIS N/A 10/01/2017   Procedure: HOT HEMOSTASIS (ARGON PLASMA COAGULATION/BICAP);  Surgeon: [name], MD;  Location: [place];  Service: Gastroenterology;  Laterality: N/A;   HOT HEMOSTASIS N/A 10/17/2017   Procedure: HOT HEMOSTASIS (ARGON PLASMA COAGULATION/BICAP);  Surgeon: Otis Brace, MD;  Location: [facility] ENDOSCOPY;  Service: Gastroenterology;  Laterality: N/A;   HOT HEMOSTASIS N/A 10/19/2017   Procedure: HOT HEMOSTASIS (ARGON PLASMA COAGULATION/BICAP);  Surgeon: Otis Brace, MD;  Location: [facility] ENDOSCOPY;  Service: Gastroenterology;  Laterality: N/A;    HOT HEMOSTASIS N/A 03/02/2020   Procedure: HOT HEMOSTASIS (ARGON PLASMA COAGULATION/BICAP);  Surgeon: Ronald Lobo, MD;  Location: [facility];  Service: Endoscopy;  Laterality: N/A;   IR IMAGING GUIDED PORT INSERTION  07/08/2018   L shoulder Surgery  2011   POLYPECTOMY  03/02/2020   Procedure: POLYPECTOMY;  Surgeon: Ronald Lobo, MD;  Location: [facility];  Service: Endoscopy;;   SCLEROTHERAPY  11/03/2020   Procedure: Clide Deutscher;  Surgeon: Wilford Corner, MD;  Location: [facility] ENDOSCOPY;  Service: Endoscopy;;   SUBMUCOSAL INJECTION  09/22/2017   Procedure: SUBMUCOSAL INJECTION;  Surgeon: Clarene Essex, MD;  Location: [place];  Service: Endoscopy;;   SUBMUCOSAL INJECTION  12/04/2018   Procedure: SUBMUCOSAL INJECTION;  Surgeon: Wilford Corner, MD;  Location: [facility];  Service: Endoscopy;;    I have reviewed the social history and family history with the patient and they are unchanged from previous note.  ALLERGIES:  is allergic to feraheme [ferumoxytol], nsaids, tomato, iron (ferrous sulfate) [ferrous sulfate er], other, and wasp venom.  MEDICATIONS:  Current Outpatient Medications  Medication Sig Dispense Refill   Accu-Chek Softclix Lancets lancets Use to check blood sugar before breakfast and before dinner while on steroids (Patient taking differently: 1 each by Other route See admin instructions. Use to check blood sugar before breakfast and before dinner while on steroids) 100 each 1   acitretin (SORIATANE) 10 MG capsule Take 10 mg by mouth daily.     ALPRAZolam (XANAX) 0.5 MG tablet  TAKE 1 TABLET(0.5 MG) BY MOUTH AT BEDTIME AS NEEDED FOR ANXIETY 30 tablet 0   Aminocaproic Acid 1000 MG TABS Take 1 tablet (1,000 mg total) by mouth in the morning and at bedtime. 60 tablet 2   amLODipine (NORVASC) 10 MG tablet Take 1 tablet (10 mg total) by mouth daily. 90 tablet 0   atorvastatin (LIPITOR) 80 MG tablet TAKE 1 TABLET(80 MG) BY MOUTH DAILY 30 tablet 3    diphenoxylate-atropine (LOMOTIL) 2.5-0.025 MG tablet 1 to 2 PO QID prn diarrhea 30 tablet 1   furosemide (LASIX) 40 MG tablet Take 40 mg by mouth daily.     glucose blood (ACCU-CHEK GUIDE) test strip Check blood sugar 2 times per day while on steroids before breakfast and dinner (Patient taking differently: 1 each by Other route See admin instructions. Check blood sugar 2 times per day while on steroids before breakfast and dinner) 50 each 3   hydrochlorothiazide (HYDRODIURIL) 12.5 MG tablet TAKE 1 TABLET(12.5 MG) BY MOUTH DAILY 90 tablet 1   lidocaine-prilocaine (EMLA) cream Apply 1 application topically as needed. 30 g 0   metFORMIN (GLUCOPHAGE) 500 MG tablet TAKE 1 TABLET(500 MG) BY MOUTH TWICE DAILY WITH A MEAL 180 tablet 3   methocarbamol (ROBAXIN) 500 MG tablet Take 1 tablet (500 mg total) by mouth every 8 (eight) hours as needed for muscle spasms (back pain/spasm). 7 tablet 0   metoprolol succinate (TOPROL-XL) 100 MG 24 hr tablet Take 100 mg by mouth daily.     pantoprazole (PROTONIX) 40 MG tablet TAKE 1 TABLET(40 MG) BY MOUTH TWICE DAILY 60 tablet 5   Podiatric Products (FLEXITOL HEEL BALM) OINT Apply 1 application topically as needed (dry skin).      potassium chloride (KLOR-CON) 20 MEQ packet Take 20 mEq by mouth daily. 30 packet 1   PROAIR HFA 108 (90 Base) MCG/ACT inhaler INHALE 1 TO 2 PUFFS INTO THE LUNGS EVERY 6 HOURS AS NEEDED FOR WHEEZING OR SHORTNESS OF BREATH 8.5 g 0   prochlorperazine (COMPAZINE) 10 MG tablet Take 1 tablet (10 mg total) by mouth every 6 (six) hours as needed for nausea or vomiting. 30 tablet 1   sertraline (ZOLOFT) 25 MG tablet Take 5 tablets (125 mg total) by mouth daily. 150 tablet 2   terbinafine (LAMISIL AT) 1 % cream Apply 1 application topically 2 (two) times daily. Both bottom and top of both feet and toes 36 g 0   traMADol (ULTRAM) 50 MG tablet Take 1 tablet (50 mg total) by mouth daily as needed for severe pain. 7 tablet 0   traZODone (DESYREL) 100 MG  tablet TAKE 1 TABLET(100 MG) BY MOUTH AT BEDTIME 30 tablet 2   No current facility-administered medications for this visit.   Facility-Administered Medications Ordered in Other Visits  Medication Dose Route Frequency Provider Last Rate Last Admin   diphenhydrAMINE (BENADRYL) 25 mg capsule            heparin lock flush 100 unit/mL  500 Units Intracatheter Once Truitt Merle, MD       sodium chloride flush (NS) 0.9 % injection 10 mL  10 mL Intracatheter PRN Truitt Merle, MD   10 mL at 08/14/20 1725   sodium chloride flush (NS) 0.9 % injection 10 mL  10 mL Intracatheter Once Truitt Merle, MD       sodium chloride flush (NS) 0.9 % injection 10 mL  10 mL Intracatheter PRN Truitt Merle, MD   10 mL at 03/01/21 1806  PHYSICAL EXAMINATION: ECOG PERFORMANCE STATUS: 2 - Symptomatic, <50% confined to bed  There were no vitals filed for this visit. Wt Readings from Last 3 Encounters:  03/01/21 228 lb 8 oz (103.6 kg)  02/07/21 235 lb (106.6 kg)  01/31/21 238 lb 4 oz (108.1 kg)     GENERAL:alert, no distress and comfortable SKIN: skin color normal, no rashes or significant lesions EYES: normal, Conjunctiva are pink and non-injected, sclera clear  NEURO: alert & oriented x 3 with fluent speech  LABORATORY DATA:  I have reviewed the data as listed CBC Latest Ref Rng & Units 03/01/2021 02/22/2021 02/09/2021  WBC 4.0 - 10.5 K/uL 12.8(H) 12.8(H) 8.6  Hemoglobin 12.0 - 15.0 g/dL 7.9(L) 9.0(L) 7.1(L)  Hematocrit 36.0 - 46.0 % 27.0(L) 30.7(L) 24.2(L)  Platelets 150 - 400 K/uL 328 312 234     CMP Latest Ref Rng & Units 02/09/2021 12/07/2020 11/05/2020  Glucose 70 - 99 mg/dL 105(H) 116(H) 91  BUN 6 - 20 mg/dL 8 13 5(L)  Creatinine 0.44 - 1.00 mg/dL 0.83 1.21(H) 0.71  Sodium 135 - 145 mmol/L 141 136 138  Potassium 3.5 - 5.1 mmol/L 3.3(L) 3.4(L) 3.8  Chloride 98 - 111 mmol/L 106 103 105  CO2 22 - 32 mmol/L 25 24 25   Calcium 8.9 - 10.3 mg/dL 9.1 9.1 8.9  Total Protein 6.5 - 8.1 g/dL 7.0 7.3 -  Total Bilirubin  0.3 - 1.2 mg/dL 0.3 0.5 -  Alkaline Phos 38 - 126 U/L 84 72 -  AST 15 - 41 U/L 12(L) 13(L) -  ALT 0 - 44 U/L <5 6 -      RADIOGRAPHIC STUDIES: I have personally reviewed the radiological images as listed and agreed with the findings in the report. No results found.    Orders Placed This Encounter  Procedures   Total Protein, Urine dipstick    Standing Status:   Standing    Number of Occurrences:   20    Standing Expiration Date:   03/01/2022   Care order/instruction    Transfuse Parameters    Standing Status:   Future    Standing Expiration Date:   03/01/2022   Type and screen         Standing Status:   Future    Standing Expiration Date:   03/01/2022   Sample to Blood Bank    Standing Status:   Standing    Number of Occurrences:   50    Standing Expiration Date:   03/01/2022   All questions were answered. The patient knows to call the clinic with any problems, questions or concerns. No barriers to learning was detected. The total time spent in the appointment was 30 minutes.     Truitt Merle, MD 03/01/2021   I, Wilburn Mylar, am acting as scribe for Truitt Merle, MD.   I have reviewed the above documentation for accuracy and completeness, and I agree with the above.

## 2021-03-02 ENCOUNTER — Other Ambulatory Visit: Payer: Self-pay

## 2021-03-02 DIAGNOSIS — D649 Anemia, unspecified: Secondary | ICD-10-CM

## 2021-03-02 LAB — PREPARE RBC (CROSSMATCH)

## 2021-03-02 MED ORDER — DIPHENHYDRAMINE HCL 25 MG PO CAPS: 25.0000 mg | ORAL_CAPSULE | Freq: Once | ORAL | Status: DC

## 2021-03-02 MED ORDER — ACETAMINOPHEN 325 MG PO TABS: 650.0000 mg | ORAL_TABLET | Freq: Once | ORAL | Status: DC

## 2021-03-02 NOTE — Progress Notes (Signed)
Released the Prepare RBC order and spoke with [place] in the [place].  [place] confirmed orders.

## 2021-03-03 ENCOUNTER — Inpatient Hospital Stay: Payer: Medicaid Other

## 2021-03-03 ENCOUNTER — Telehealth: Payer: Self-pay

## 2021-03-03 ENCOUNTER — Other Ambulatory Visit: Payer: Self-pay

## 2021-03-03 VITALS — BP 153/72 | HR 72 | Temp 98.0°F | Resp 17

## 2021-03-03 DIAGNOSIS — D649 Anemia, unspecified: Secondary | ICD-10-CM

## 2021-03-03 DIAGNOSIS — Z5112 Encounter for antineoplastic immunotherapy: Secondary | ICD-10-CM | POA: Diagnosis not present

## 2021-03-03 MED ORDER — SODIUM CHLORIDE 0.9% FLUSH
10.0000 mL | INTRAVENOUS | Status: AC | PRN
  Administered 2021-03-03: 10 mL

## 2021-03-03 MED ORDER — ACETAMINOPHEN 325 MG PO TABS
650.0000 mg | ORAL_TABLET | Freq: Once | ORAL | Status: AC
  Administered 2021-03-03: 650 mg via ORAL
  Filled 2021-03-03 (×2): qty 2

## 2021-03-03 MED ORDER — SODIUM CHLORIDE 0.9% IV SOLUTION
250.0000 mL | Freq: Once | INTRAVENOUS | Status: AC
  Administered 2021-03-03: 250 mL via INTRAVENOUS

## 2021-03-03 MED ORDER — HEPARIN SOD (PORK) LOCK FLUSH 100 UNIT/ML IV SOLN: 250.0000 [IU] | INTRAVENOUS | Status: DC | PRN

## 2021-03-03 MED ORDER — DIPHENHYDRAMINE HCL 25 MG PO CAPS
25.0000 mg | ORAL_CAPSULE | Freq: Once | ORAL | Status: AC
  Administered 2021-03-03: 25 mg via ORAL
  Filled 2021-03-03 (×2): qty 1

## 2021-03-03 MED ORDER — HEPARIN SOD (PORK) LOCK FLUSH 100 UNIT/ML IV SOLN
500.0000 [IU] | Freq: Every day | INTRAVENOUS | Status: AC | PRN
  Administered 2021-03-03: 500 [IU]

## 2021-03-03 NOTE — Patient Instructions (Signed)
Blood Transfusion, Adult, Care After This sheet gives you information about how to care for yourself after your procedure. Your doctor may also give you more specific instructions. If you have problems or questions, contact your doctor. What can I expect after the procedure? After the procedure, it is common to have: Bruising and soreness at the IV site. A headache. Follow these instructions at home: Insertion site care   Follow instructions from your doctor about how to take care of your insertion site. This is where an IV tube was put into your vein. Make sure you: Wash your hands with soap and water before and after you change your bandage (dressing). If you cannot use soap and water, use hand sanitizer. Change your bandage as told by your doctor. Check your insertion site every day for signs of infection. Check for: Redness, swelling, or pain. Bleeding from the site. Warmth. Pus or a bad smell. General instructions Take over-the-counter and prescription medicines only as told by your doctor. Rest as told by your doctor. Go back to your normal activities as told by your doctor. Keep all follow-up visits as told by your doctor. This is important. Contact a doctor if: You have itching or red, swollen areas of skin (hives). You feel worried or nervous (anxious). You feel weak after doing your normal activities. You have redness, swelling, warmth, or pain around the insertion site. You have blood coming from the insertion site, and the blood does not stop with pressure. You have pus or a bad smell coming from the insertion site. Get help right away if: You have signs of a serious reaction. This may be coming from an allergy or the body's defense system (immune system). Signs include: Trouble breathing or shortness of breath. Swelling of the face or feeling warm (flushed). Fever or chills. Head, chest, or back pain. Dark pee (urine) or blood in the pee. Widespread rash. Fast  heartbeat. Feeling dizzy or light-headed. You may receive your blood transfusion in an outpatient setting. If so, you will be told whom to contact to report any reactions. These symptoms may be an emergency. Do not wait to see if the symptoms will go away. Get medical help right away. Call your local emergency services (911 in the [place] Do not drive yourself to the hospital. Summary Bruising and soreness at the IV site are common. Check your insertion site every day for signs of infection. Rest as told by your doctor. Go back to your normal activities as told by your doctor. Get help right away if you have signs of a serious reaction. This information is not intended to replace advice given to you by your health care provider. Make sure you discuss any questions you have with your health care provider. Document Revised: 07/27/2020 Document Reviewed: 09/24/2018 Elsevier Patient Education  2022 [employer]  

## 2021-03-04 LAB — BPAM RBC
Blood Product Expiration Date: 202212152359
ISSUE DATE / TIME: 202211191140
Unit Type and Rh: 600

## 2021-03-04 LAB — TYPE AND SCREEN
ABO/RH(D): A NEG
Antibody Screen: NEGATIVE
Unit division: 0

## 2021-03-05 ENCOUNTER — Inpatient Hospital Stay: Payer: Medicaid Other

## 2021-03-05 ENCOUNTER — Other Ambulatory Visit: Payer: Self-pay

## 2021-03-05 VITALS — BP 146/65 | HR 80 | Temp 99.0°F | Resp 20

## 2021-03-05 DIAGNOSIS — Z95828 Presence of other vascular implants and grafts: Secondary | ICD-10-CM

## 2021-03-05 DIAGNOSIS — D500 Iron deficiency anemia secondary to blood loss (chronic): Secondary | ICD-10-CM

## 2021-03-05 DIAGNOSIS — I780 Hereditary hemorrhagic telangiectasia: Secondary | ICD-10-CM

## 2021-03-05 DIAGNOSIS — Z5112 Encounter for antineoplastic immunotherapy: Secondary | ICD-10-CM | POA: Diagnosis not present

## 2021-03-05 DIAGNOSIS — D649 Anemia, unspecified: Secondary | ICD-10-CM

## 2021-03-05 LAB — CBC WITH DIFFERENTIAL (CANCER CENTER ONLY)
Abs Immature Granulocytes: 0.05 10*3/uL (ref 0.00–0.07)
Basophils Absolute: 0 10*3/uL (ref 0.0–0.1)
Basophils Relative: 0 %
Eosinophils Absolute: 0.2 10*3/uL (ref 0.0–0.5)
Eosinophils Relative: 2 %
HCT: 28.8 % — ABNORMAL LOW (ref 36.0–46.0)
Hemoglobin: 8.9 g/dL — ABNORMAL LOW (ref 12.0–15.0)
Immature Granulocytes: 0 %
Lymphocytes Relative: 11 %
Lymphs Abs: 1.4 10*3/uL (ref 0.7–4.0)
MCH: 26.6 pg (ref 26.0–34.0)
MCHC: 30.9 g/dL (ref 30.0–36.0)
MCV: 86 fL (ref 80.0–100.0)
Monocytes Absolute: 0.5 10*3/uL (ref 0.1–1.0)
Monocytes Relative: 4 %
Neutro Abs: 10.8 10*3/uL — ABNORMAL HIGH (ref 1.7–7.7)
Neutrophils Relative %: 83 %
Platelet Count: 273 10*3/uL (ref 150–400)
RBC: 3.35 MIL/uL — ABNORMAL LOW (ref 3.87–5.11)
RDW: 21.4 % — ABNORMAL HIGH (ref 11.5–15.5)
WBC Count: 12.9 10*3/uL — ABNORMAL HIGH (ref 4.0–10.5)
nRBC: 0 % (ref 0.0–0.2)

## 2021-03-05 LAB — SAMPLE TO BLOOD BANK

## 2021-03-05 LAB — TYPE AND SCREEN
ABO/RH(D): A NEG
Antibody Screen: NEGATIVE

## 2021-03-05 MED ORDER — SODIUM CHLORIDE 0.9 % IV SOLN
250.0000 mg | Freq: Once | INTRAVENOUS | Status: AC
  Administered 2021-03-05: 250 mg via INTRAVENOUS
  Filled 2021-03-05: qty 20

## 2021-03-05 MED ORDER — SODIUM CHLORIDE 0.9% FLUSH
10.0000 mL | Freq: Once | INTRAVENOUS | Status: AC
  Administered 2021-03-05: 10 mL

## 2021-03-05 MED ORDER — HEPARIN SOD (PORK) LOCK FLUSH 100 UNIT/ML IV SOLN
500.0000 [IU] | Freq: Once | INTRAVENOUS | Status: AC
  Administered 2021-03-05: 500 [IU]

## 2021-03-05 MED ORDER — SODIUM CHLORIDE 0.9 % IV SOLN: Freq: Once | INTRAVENOUS | Status: AC

## 2021-03-05 NOTE — Progress Notes (Signed)
Pt declined to stay for 30 min observation post iron infusion. Pt VSS, discharged in stable condition to lobby via WC.

## 2021-03-05 NOTE — Patient Instructions (Signed)
Sodium Ferric Gluconate Complex Injection ?What is this medication? ?SODIUM FERRIC GLUCONATE COMPLEX (SOE dee um FER ik GLOO koe nate KOM pleks) treats low levels of iron (iron deficiency anemia) in people with kidney disease. Iron is a mineral that plays an important role in making red blood cells, which carry oxygen from your lungs to the rest of your body. ?This medicine may be used for other purposes; ask your health care provider or pharmacist if you have questions. ?COMMON BRAND NAME(S): Ferrlecit, Nulecit ?What should I tell my care team before I take this medication? ?They need to know if you have any of the following conditions: ?Anemia that is not from iron deficiency ?High levels of iron in the blood ?An unusual or allergic reaction to iron, other medications, foods, dyes, or preservatives ?Pregnant or are trying to become pregnant ?Breast-feeding ?How should I use this medication? ?This medication is injected into a vein. It is given by your care team in a hospital or clinic setting. ?Talk to your care team about the use of this medication in children. While it may be prescribed for children as young as 6 years for selected conditions, precautions do apply. ?Overdosage: If you think you have taken too much of this medicine contact a poison control center or emergency room at once. ?NOTE: This medicine is only for you. Do not share this medicine with others. ?What if I miss a dose? ?It is important not to miss your dose. Call your care team if you are unable to keep an appointment. ?What may interact with this medication? ?Do not take this medication with any of the following: ?Deferasirox ?Deferoxamine ?Dimercaprol ?This medication may also interact with the following: ?Other iron products ?This list may not describe all possible interactions. Give your health care provider a list of all the medicines, herbs, non-prescription drugs, or dietary supplements you use. Also tell them if you smoke, drink  alcohol, or use illegal drugs. Some items may interact with your medicine. ?What should I watch for while using this medication? ?Your condition will be monitored carefully while you are receiving this medication. ?Visit your care team for regular checks on your progress. You may need blood work while you are taking this medication. ?What side effects may I notice from receiving this medication? ?Side effects that you should report to your care team as soon as possible: ?Allergic reactions--skin rash, itching, hives, swelling of the face, lips, tongue, or throat ?Low blood pressure--dizziness, feeling faint or lightheaded, blurry vision ?Shortness of breath ?Side effects that usually do not require medical attention (report to your care team if they continue or are bothersome): ?Flushing ?Headache ?Joint pain ?Muscle pain ?Nausea ?Pain, redness, or irritation at injection site ?This list may not describe all possible side effects. Call your doctor for medical advice about side effects. You may report side effects to FDA at 1-800-FDA-1088. ?Where should I keep my medication? ?This medication is given in a hospital or clinic and will not be stored at home. ?NOTE: This sheet is a summary. It may not cover all possible information. If you have questions about this medicine, talk to your doctor, pharmacist, or health care provider. ?? 2022 Elsevier/Gold Standard (2020-08-25 00:00:00) ? ?

## 2021-03-06 NOTE — Telephone Encounter (Signed)
[place] office please schedule patient for appoint per Dr. Collene Gobble to discuss med refill.

## 2021-03-06 NOTE — Telephone Encounter (Signed)
Medication refill requested denied.  Called patient to schedule a future appointment, but no answer.  Left message to give the clinic a call back.

## 2021-03-07 ENCOUNTER — Other Ambulatory Visit: Payer: Medicaid Other

## 2021-03-07 ENCOUNTER — Ambulatory Visit: Payer: Medicaid Other

## 2021-03-09 ENCOUNTER — Inpatient Hospital Stay: Payer: Medicaid Other

## 2021-03-12 ENCOUNTER — Encounter: Payer: Medicaid Other | Admitting: Student

## 2021-03-12 DIAGNOSIS — N3941 Urge incontinence: Secondary | ICD-10-CM | POA: Diagnosis not present

## 2021-03-12 DIAGNOSIS — N319 Neuromuscular dysfunction of bladder, unspecified: Secondary | ICD-10-CM | POA: Diagnosis not present

## 2021-03-16 ENCOUNTER — Inpatient Hospital Stay: Payer: Medicaid Other | Attending: Hematology

## 2021-03-16 ENCOUNTER — Inpatient Hospital Stay: Payer: Medicaid Other

## 2021-03-16 ENCOUNTER — Other Ambulatory Visit: Payer: Self-pay

## 2021-03-16 ENCOUNTER — Telehealth: Payer: Self-pay

## 2021-03-16 VITALS — BP 150/71 | HR 77 | Temp 98.4°F | Resp 18 | Wt 244.0 lb

## 2021-03-16 DIAGNOSIS — D649 Anemia, unspecified: Secondary | ICD-10-CM

## 2021-03-16 DIAGNOSIS — D500 Iron deficiency anemia secondary to blood loss (chronic): Secondary | ICD-10-CM

## 2021-03-16 DIAGNOSIS — Z79899 Other long term (current) drug therapy: Secondary | ICD-10-CM | POA: Insufficient documentation

## 2021-03-16 DIAGNOSIS — Z95828 Presence of other vascular implants and grafts: Secondary | ICD-10-CM

## 2021-03-16 DIAGNOSIS — I780 Hereditary hemorrhagic telangiectasia: Secondary | ICD-10-CM | POA: Insufficient documentation

## 2021-03-16 DIAGNOSIS — Z5112 Encounter for antineoplastic immunotherapy: Secondary | ICD-10-CM | POA: Insufficient documentation

## 2021-03-16 DIAGNOSIS — Q2733 Arteriovenous malformation of digestive system vessel: Secondary | ICD-10-CM

## 2021-03-16 DIAGNOSIS — R040 Epistaxis: Secondary | ICD-10-CM | POA: Diagnosis not present

## 2021-03-16 LAB — CMP (CANCER CENTER ONLY)
ALT: 7 U/L (ref 0–44)
AST: 13 U/L — ABNORMAL LOW (ref 15–41)
Albumin: 3.4 g/dL — ABNORMAL LOW (ref 3.5–5.0)
Alkaline Phosphatase: 80 U/L (ref 38–126)
Anion gap: 10 (ref 5–15)
BUN: 10 mg/dL (ref 6–20)
CO2: 24 mmol/L (ref 22–32)
Calcium: 9.3 mg/dL (ref 8.9–10.3)
Chloride: 108 mmol/L (ref 98–111)
Creatinine: 0.81 mg/dL (ref 0.44–1.00)
GFR, Estimated: >60 mL/min (ref >60)
Glucose, Bld: 110 mg/dL — ABNORMAL HIGH (ref 70–99)
Potassium: 3.7 mmol/L (ref 3.5–5.1)
Sodium: 142 mmol/L (ref 135–145)
Total Bilirubin: <0.2 mg/dL — ABNORMAL LOW (ref 0.3–1.2)
Total Protein: 7.2 g/dL (ref 6.5–8.1)

## 2021-03-16 LAB — SAMPLE TO BLOOD BANK

## 2021-03-16 LAB — CBC WITH DIFFERENTIAL (CANCER CENTER ONLY)
Abs Immature Granulocytes: 0.05 10*3/uL (ref 0.00–0.07)
Basophils Absolute: 0 10*3/uL (ref 0.0–0.1)
Basophils Relative: 0 %
Eosinophils Absolute: 0.2 10*3/uL (ref 0.0–0.5)
Eosinophils Relative: 1 %
HCT: 24.4 % — ABNORMAL LOW (ref 36.0–46.0)
Hemoglobin: 6.9 g/dL — CL (ref 12.0–15.0)
Immature Granulocytes: 0 %
Lymphocytes Relative: 13 %
Lymphs Abs: 1.7 10*3/uL (ref 0.7–4.0)
MCH: 25.5 pg — ABNORMAL LOW (ref 26.0–34.0)
MCHC: 28.3 g/dL — ABNORMAL LOW (ref 30.0–36.0)
MCV: 90 fL (ref 80.0–100.0)
Monocytes Absolute: 0.6 10*3/uL (ref 0.1–1.0)
Monocytes Relative: 5 %
Neutro Abs: 10.1 10*3/uL — ABNORMAL HIGH (ref 1.7–7.7)
Neutrophils Relative %: 81 %
Platelet Count: 343 10*3/uL (ref 150–400)
RBC: 2.71 MIL/uL — ABNORMAL LOW (ref 3.87–5.11)
RDW: 22 % — ABNORMAL HIGH (ref 11.5–15.5)
WBC Count: 12.6 10*3/uL — ABNORMAL HIGH (ref 4.0–10.5)
nRBC: 0.2 % (ref 0.0–0.2)

## 2021-03-16 LAB — IRON AND TIBC
Iron: 40 ug/dL — ABNORMAL LOW (ref 41–142)
Saturation Ratios: 15 % — ABNORMAL LOW (ref 21–57)
TIBC: 259 ug/dL (ref 236–444)
UIBC: 219 ug/dL (ref 120–384)

## 2021-03-16 LAB — TOTAL PROTEIN, URINE DIPSTICK: Protein, ur: 30 mg/dL — AB

## 2021-03-16 LAB — FERRITIN: Ferritin: 75 ng/mL (ref 11–307)

## 2021-03-16 LAB — PREPARE RBC (CROSSMATCH)

## 2021-03-16 MED ORDER — SODIUM CHLORIDE 0.9 % IV SOLN
Freq: Once | INTRAVENOUS | Status: AC
Start: 2021-03-16 — End: 2021-03-16

## 2021-03-16 MED ORDER — DIPHENHYDRAMINE HCL 25 MG PO CAPS
25.0000 mg | ORAL_CAPSULE | Freq: Once | ORAL | Status: AC
  Administered 2021-03-16: 25 mg via ORAL
  Filled 2021-03-16: qty 1

## 2021-03-16 MED ORDER — SODIUM CHLORIDE 0.9 % IV SOLN
5.0000 mg/kg | Freq: Once | INTRAVENOUS | Status: AC
  Administered 2021-03-16: 500 mg via INTRAVENOUS
  Filled 2021-03-16: qty 16

## 2021-03-16 MED ORDER — ACETAMINOPHEN 325 MG PO TABS
650.0000 mg | ORAL_TABLET | Freq: Once | ORAL | Status: AC
  Administered 2021-03-16: 650 mg via ORAL
  Filled 2021-03-16: qty 2

## 2021-03-16 MED ORDER — HEPARIN SOD (PORK) LOCK FLUSH 100 UNIT/ML IV SOLN
500.0000 [IU] | Freq: Once | INTRAVENOUS | Status: AC | PRN
  Administered 2021-03-16: 500 [IU]

## 2021-03-16 MED ORDER — SODIUM CHLORIDE 0.9% IV SOLUTION
250.0000 mL | Freq: Once | INTRAVENOUS | Status: AC
  Administered 2021-03-16: 250 mL via INTRAVENOUS

## 2021-03-16 MED ORDER — SODIUM CHLORIDE 0.9% FLUSH
10.0000 mL | INTRAVENOUS | Status: DC | PRN
  Administered 2021-03-16: 10 mL

## 2021-03-16 MED ORDER — SODIUM CHLORIDE 0.9% FLUSH
10.0000 mL | Freq: Once | INTRAVENOUS | Status: AC
  Administered 2021-03-16: 10 mL

## 2021-03-16 MED ORDER — SODIUM CHLORIDE 0.9 % IV SOLN
250.0000 mg | Freq: Once | INTRAVENOUS | Status: DC
  Filled 2021-03-16: qty 20

## 2021-03-16 NOTE — Progress Notes (Signed)
Placed orders for 2 units of PRBCs.  Released T&S and Prepare orders to Blood Bank.  Spoke with [place] in the Blood Bank and [place] confirmed orders for T&S as well as the prepare order.

## 2021-03-16 NOTE — Progress Notes (Signed)
Per Tammi Sou, RN, okay to proceed with IV iron before avastin while waiting for urine sample.  Hgb resulted as 6.9- Per Dr. Burr Medico, hold IV iron today and proceed with blood transfusion. IV iron will be given next week. Pharmacy made aware.

## 2021-03-16 NOTE — Telephone Encounter (Signed)
CRITICAL VALUE STICKER  CRITICAL VALUE:  HGB 6.9   RECEIVER (on-site recipient of call): Nilsa Macht P. LPN  [place] NOTIFIED:  03/16/2021 12:02PM  MESSENGER (representative from lab): [name]  MD NOTIFIED: Dr. Burr Medico   TIME OF NOTIFICATION: 12:05 pm

## 2021-03-16 NOTE — Telephone Encounter (Signed)
This RN called patient at 1039 regarding her flush appointment scheduled for 1015. Patient stated "nobody told me I had an appointment at 1015, I'm on my way out the door". Charge nurse, Delle Reining, made aware of patient's tardiness.

## 2021-03-16 NOTE — Progress Notes (Signed)
Per Dr Burr Medico, "OK To treat w/out urine protein today".

## 2021-03-16 NOTE — Patient Instructions (Signed)
Blood Transfusion, Adult, Care After This sheet gives you information about how to care for yourself after your procedure. Your health care provider may also give you more specific instructions. If you have problems or questions, contact your health care provider. What can I expect after the procedure? After the procedure, it is common to have: Bruising and soreness where the IV was inserted. A headache. Follow these instructions at home: IV insertion site care   Follow instructions from your health care provider about how to take care of your IV insertion site. Make sure you: Wash your hands with soap and water before and after you change your bandage (dressing). If soap and water are not available, use hand sanitizer. Change your dressing as told by your health care provider. Check your IV insertion site every day for signs of infection. Check for: Redness, swelling, or pain. Bleeding from the site. Warmth. Pus or a bad smell. General instructions Take over-the-counter and prescription medicines only as told by your health care provider. Rest as told by your health care provider. Return to your normal activities as told by your health care provider. Keep all follow-up visits as told by your health care provider. This is important. Contact a health care provider if: You have itching or red, swollen areas of skin (hives). You feel anxious. You feel weak after doing your normal activities. You have redness, swelling, warmth, or pain around the IV insertion site. You have blood coming from the IV insertion site that does not stop with pressure. You have pus or a bad smell coming from your IV insertion site. Get help right away if: You have symptoms of a serious allergic or immune system reaction, including: Trouble breathing or shortness of breath. Swelling of the face or feeling flushed. Fever or chills. Pain in the head, back, or chest. Dark urine or blood in the urine. Widespread  rash. Fast heartbeat. Feeling dizzy or light-headed. If you receive your blood transfusion in an outpatient setting, you will be told whom to contact to report any reactions. These symptoms may represent a serious problem that is an emergency. Do not wait to see if the symptoms will go away. Get medical help right away. Call your local emergency services (911 in the [place] Do not drive yourself to the hospital. Summary Bruising and tenderness around the IV insertion site are common. Check your IV insertion site every day for signs of infection. Rest as told by your health care provider. Return to your normal activities as told by your health care provider. Get help right away for symptoms of a serious allergic or immune system reaction to blood transfusion. This information is not intended to replace advice given to you by your health care provider. Make sure you discuss any questions you have with your health care provider. Document Revised: 07/27/2020 Document Reviewed: 09/24/2018 Elsevier Patient Education  2022 Elsevier Inc.  

## 2021-03-19 LAB — TYPE AND SCREEN
ABO/RH(D): A NEG
Antibody Screen: NEGATIVE
Unit division: 0
Unit division: 0

## 2021-03-19 LAB — BPAM RBC
Blood Product Expiration Date: 202301022359
Blood Product Expiration Date: 202301022359
ISSUE DATE / TIME: 202212021326
ISSUE DATE / TIME: 202212021510
Unit Type and Rh: 600
Unit Type and Rh: 600

## 2021-03-20 ENCOUNTER — Inpatient Hospital Stay: Payer: Medicaid Other

## 2021-03-23 ENCOUNTER — Inpatient Hospital Stay: Payer: Medicaid Other

## 2021-03-23 ENCOUNTER — Inpatient Hospital Stay (HOSPITAL_BASED_OUTPATIENT_CLINIC_OR_DEPARTMENT_OTHER): Payer: Medicaid Other | Admitting: Hematology

## 2021-03-23 ENCOUNTER — Encounter (HOSPITAL_COMMUNITY): Payer: Self-pay

## 2021-03-23 ENCOUNTER — Emergency Department (HOSPITAL_COMMUNITY)
Admission: EM | Admit: 2021-03-23 | Discharge: 2021-03-23 | Disposition: A | Discharge status: Home / Self Care | Payer: Medicaid Other | Attending: Emergency Medicine | Admitting: Emergency Medicine

## 2021-03-23 ENCOUNTER — Other Ambulatory Visit: Payer: Self-pay

## 2021-03-23 DIAGNOSIS — Z79899 Other long term (current) drug therapy: Secondary | ICD-10-CM | POA: Diagnosis not present

## 2021-03-23 DIAGNOSIS — I10 Essential (primary) hypertension: Secondary | ICD-10-CM | POA: Insufficient documentation

## 2021-03-23 DIAGNOSIS — I780 Hereditary hemorrhagic telangiectasia: Secondary | ICD-10-CM

## 2021-03-23 DIAGNOSIS — R040 Epistaxis: Secondary | ICD-10-CM | POA: Diagnosis not present

## 2021-03-23 DIAGNOSIS — D500 Iron deficiency anemia secondary to blood loss (chronic): Secondary | ICD-10-CM

## 2021-03-23 DIAGNOSIS — E119 Type 2 diabetes mellitus without complications: Secondary | ICD-10-CM | POA: Insufficient documentation

## 2021-03-23 DIAGNOSIS — F17210 Nicotine dependence, cigarettes, uncomplicated: Secondary | ICD-10-CM | POA: Insufficient documentation

## 2021-03-23 DIAGNOSIS — Z5112 Encounter for antineoplastic immunotherapy: Secondary | ICD-10-CM | POA: Diagnosis not present

## 2021-03-23 DIAGNOSIS — D649 Anemia, unspecified: Secondary | ICD-10-CM

## 2021-03-23 DIAGNOSIS — Z7984 Long term (current) use of oral hypoglycemic drugs: Secondary | ICD-10-CM | POA: Diagnosis not present

## 2021-03-23 DIAGNOSIS — Z95828 Presence of other vascular implants and grafts: Secondary | ICD-10-CM

## 2021-03-23 LAB — CBC WITH DIFFERENTIAL (CANCER CENTER ONLY)
Abs Immature Granulocytes: 0.04 10*3/uL (ref 0.00–0.07)
Basophils Absolute: 0.1 10*3/uL (ref 0.0–0.1)
Basophils Relative: 1 %
Eosinophils Absolute: 0.2 10*3/uL (ref 0.0–0.5)
Eosinophils Relative: 1 %
HCT: 30 % — ABNORMAL LOW (ref 36.0–46.0)
Hemoglobin: 9 g/dL — ABNORMAL LOW (ref 12.0–15.0)
Immature Granulocytes: 0 %
Lymphocytes Relative: 12 %
Lymphs Abs: 1.6 10*3/uL (ref 0.7–4.0)
MCH: 26.1 pg (ref 26.0–34.0)
MCHC: 30 g/dL (ref 30.0–36.0)
MCV: 87 fL (ref 80.0–100.0)
Monocytes Absolute: 0.7 10*3/uL (ref 0.1–1.0)
Monocytes Relative: 6 %
Neutro Abs: 10.8 10*3/uL — ABNORMAL HIGH (ref 1.7–7.7)
Neutrophils Relative %: 80 %
Platelet Count: 321 10*3/uL (ref 150–400)
RBC: 3.45 MIL/uL — ABNORMAL LOW (ref 3.87–5.11)
RDW: 19.2 % — ABNORMAL HIGH (ref 11.5–15.5)
WBC Count: 13.4 10*3/uL — ABNORMAL HIGH (ref 4.0–10.5)
nRBC: 0 % (ref 0.0–0.2)

## 2021-03-23 LAB — SAMPLE TO BLOOD BANK

## 2021-03-23 LAB — FERRITIN: Ferritin: 56 ng/mL (ref 11–307)

## 2021-03-23 MED ORDER — TRANEXAMIC ACID FOR EPISTAXIS
500.0000 mg | Freq: Once | TOPICAL | Status: AC
  Administered 2021-03-23: 500 mg via TOPICAL
  Filled 2021-03-23: qty 10

## 2021-03-23 MED ORDER — HEPARIN SOD (PORK) LOCK FLUSH 100 UNIT/ML IV SOLN
500.0000 [IU] | Freq: Once | INTRAVENOUS | Status: AC
  Administered 2021-03-23: 500 [IU]
  Filled 2021-03-23: qty 5

## 2021-03-23 MED ORDER — PHENYLEPHRINE HCL 1 % NA SOLN
2.0000 [drp] | Freq: Once | NASAL | Status: DC
  Filled 2021-03-23: qty 15

## 2021-03-23 MED ORDER — SODIUM CHLORIDE 0.9% FLUSH
10.0000 mL | Freq: Once | INTRAVENOUS | Status: AC
Start: 2021-03-23 — End: 2021-03-23
  Administered 2021-03-23: 10 mL

## 2021-03-23 MED ORDER — OXYMETAZOLINE HCL 0.05 % NA SOLN
1.0000 | Freq: Once | NASAL | Status: AC
  Administered 2021-03-23: 1 via NASAL
  Filled 2021-03-23: qty 30

## 2021-03-23 NOTE — Progress Notes (Addendum)
[place]   [phone] [phone]   Clinic Follow up Note   Patient Care Team: [name], MD as PCP - General  Date of Service:  03/23/2021  CHIEF COMPLAINT: f/u of HHT and anemia  PREVIOUS THERAPY: -Multiple EGD, small bowel enteroscope, C-scope with APC -Oral and iv amicar were given during hospital stay, no effect  -IR embolization of of the left gastric and short gastric artery branch vessels without evidence of residual flow in the Dieulafoy lesion on 12/10/17 -Avastin on 8/2 and 8/30 at [facility]; starting 12/26/17 continue 5 mg/kg q2 weeks until 02/20/18, restarted on 04/03/2018  -Tamoxifen started in 10/2017 for [place] stopped 12/10/18 due to DVT   CURRENT THERAPY:  -Blood transfusion for severe anemia with Hg<8.0 (2units if Hg<7) secondary to GI bleeding as needed  -iv ferric gluconate every 1-2 weeks as needed with ferritin goal 100-200.  -Restarted Avastin/Zirabev q2weeks on 04/03/18. Given PRN due to proteinuria. -Amicar 1g bid starting 07/17/2018. Reduced to only as needed on 12/10/18 due to DVT. Restart daily on 06/16/20 due to increased bleeding. Increased to BID, then TID on 07/07/20.  ASSESSMENT & PLAN:  Peggy House is a 60 y.o. 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 -Pt was hospitalized several times from 1-10/2017 for severe recurrent GI bleeding from AVMs which required multiple blood transfusions and APCs. 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  -She is s/p IR embolization of the left Gastric and short gastric artery branch vessels without evidence of residual flow in the Dieulafoy lesion on 12/10/2017.  -She required cauterization for stomach for severe Gi bleeding in 11/2018, and epinephrine injection  for bleeding ulcer on 11/03/20 -she continues on Amicar TID. -She has been on bevacizumab every 2-4 week, treatment has been intermittently held due to large proteinuria and missed appointments.  -she presented today with severe epistaxis. I was not able to stop the bleeding with manual compression for 15 mins.  She was transported to the ED to stop the bleeding.  2. 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  -She was admitted on 11/01/20 for symptomatic anemia following a syncopal episode, as well as hematochezia. -Hgb 7.9 today (03/01/21) -Follow-up in 4 weeks   3. B/l LE Edema -Stable. Continue compression socks and leg elevation  -[place] of leg found lymphadenopathy of b/l groin. She does have skin darkening of her legs which she has had in recent months.    4. Smoking cessation -She was smoking 10 cigarettes a day on average before recent hospitalization. She is smoking about 1/2 ppd (as of 08/03/20) -I strongly encouraged her to stop smoking completely. She tried previously but had trouble.   5. HTN, Recently diagnosed DM, Anxiety/Depression -Continue medications. Will monitor on Avastin.   -Continue to f/u with PCP -she requested a number of refills today. I advised her to speak with her PCP. I did refill her amlodipine, trazadone, and Xanax.     PLAN:  -due to her severe epistaxis, we sent her to [facility]  ED -she will return tomorrow for iv iron  -continue weekly lab and ferric gluconate 250mg , beva every 2 weeks   Treatment parameter:  -Ferric Gluconate 250mg  unless Ferritin >200 on last lab  -beva if urine protein <=100 -blood transfusion if hg<8.0, 2u if Hg<7.0   No problem-specific Assessment & Plan notes  found for this encounter.   INTERVAL HISTORY:  Peggy House is here for a follow up of HHT and anemia. She was last seen by me on 03/01/21. She presents to the clinic alone. Her nose began bleeding while she was in the lobby today. I taught her how to apply pressure to her nose to help stop the bleeding. I asked what she normally does at home when this happens, and she reports she just lets it go.   All other systems were reviewed with the patient and are negative.  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    SURGICAL HISTORY: Past Surgical History:  Procedure Laterality Date   ABDOMINAL HYSTERECTOMY     CARPAL TUNNEL RELEASE  05/13/2011   Procedure: CARPAL TUNNEL RELEASE;  Surgeon: Nita Sells, MD;  Location: [place];  Service: Orthopedics;  Laterality: Left;   COLONOSCOPY N/A 03/02/2020   Procedure: COLONOSCOPY;  Surgeon: Ronald Lobo, MD;  Location: [facility] ENDOSCOPY;  Service: Endoscopy;  Laterality: N/A;   COLONOSCOPY WITH PROPOFOL N/A 04/28/2014   Procedure: COLONOSCOPY WITH PROPOFOL;  Surgeon: Cleotis Nipper, MD;  Location: [facility] ENDOSCOPY;  Service: Endoscopy;  Laterality: N/A;   DG TOES*L*  2/10   rt   DILATION AND CURETTAGE OF UTERUS     ENTEROSCOPY N/A 10/17/2017   Procedure: ENTEROSCOPY;  Surgeon: Otis Brace, MD;  Location: [place];  Service: Gastroenterology;  Laterality: N/A;   ESOPHAGOGASTRODUODENOSCOPY N/A 04/10/2014   Procedure: ESOPHAGOGASTRODUODENOSCOPY (EGD);  Surgeon: [name], MD;  Location: [facility] ENDOSCOPY;  Service: Endoscopy;  Laterality: N/A;    ESOPHAGOGASTRODUODENOSCOPY N/A 05/10/2017   Procedure: ESOPHAGOGASTRODUODENOSCOPY (EGD);  Surgeon: Ronald Lobo, MD;  Location: [facility] ENDOSCOPY;  Service: Endoscopy;  Laterality: N/A;   ESOPHAGOGASTRODUODENOSCOPY N/A 09/22/2017   Procedure: ESOPHAGOGASTRODUODENOSCOPY (EGD);  Surgeon: Clarene Essex, MD;  Location: [place];  Service: Endoscopy;  Laterality: N/A;  bedside   ESOPHAGOGASTRODUODENOSCOPY N/A 03/02/2020   Procedure: ESOPHAGOGASTRODUODENOSCOPY (EGD);  Surgeon: Ronald Lobo, MD;  Location: [facility];  Service: Endoscopy;  Laterality: N/A;   ESOPHAGOGASTRODUODENOSCOPY N/A 11/03/2020   Procedure: ESOPHAGOGASTRODUODENOSCOPY (EGD);  Surgeon: Wilford Corner, MD;  Location: [place];  Service: Endoscopy;  Laterality: N/A;   ESOPHAGOGASTRODUODENOSCOPY (EGD) WITH PROPOFOL N/A 04/27/2014   Procedure: ESOPHAGOGASTRODUODENOSCOPY (EGD) WITH PROPOFOL;  Surgeon: Cleotis Nipper, MD;  Location: [place];  Service: Endoscopy;  Laterality: N/A;  possible apc   ESOPHAGOGASTRODUODENOSCOPY (EGD) WITH PROPOFOL N/A 09/30/2017   Procedure: ESOPHAGOGASTRODUODENOSCOPY (EGD) WITH PROPOFOL;  Surgeon: [name], MD;  Location: [place];  Service: Gastroenterology;  Laterality: N/A;   ESOPHAGOGASTRODUODENOSCOPY (EGD) WITH PROPOFOL N/A 10/01/2017   Procedure: ESOPHAGOGASTRODUODENOSCOPY (EGD) WITH PROPOFOL;  Surgeon: [name], MD;  Location: [place];  Service: Gastroenterology;  Laterality: N/A;   ESOPHAGOGASTRODUODENOSCOPY (EGD)  WITH PROPOFOL N/A 10/08/2017   Procedure: ESOPHAGOGASTRODUODENOSCOPY (EGD) WITH PROPOFOL;  Surgeon: Otis Brace, MD;  Location: [place];  Service: Gastroenterology;  Laterality: N/A;   ESOPHAGOGASTRODUODENOSCOPY (EGD) WITH PROPOFOL N/A 10/17/2017   Procedure: ESOPHAGOGASTRODUODENOSCOPY (EGD) WITH PROPOFOL;  Surgeon: Otis Brace, MD;  Location: [place];  Service: Gastroenterology;  Laterality: N/A;   ESOPHAGOGASTRODUODENOSCOPY (EGD) WITH PROPOFOL N/A  10/19/2017   Procedure: ESOPHAGOGASTRODUODENOSCOPY (EGD) WITH PROPOFOL;  Surgeon: Otis Brace, MD;  Location: [place];  Service: Gastroenterology;  Laterality: N/A;   ESOPHAGOGASTRODUODENOSCOPY (EGD) WITH PROPOFOL N/A 12/04/2018   Procedure: ESOPHAGOGASTRODUODENOSCOPY (EGD) WITH PROPOFOL;  Surgeon: Wilford Corner, MD;  Location: [facility] ENDOSCOPY;  Service: Endoscopy;  Laterality: N/A;   GIVENS CAPSULE STUDY N/A 10/02/2017   Procedure: GIVENS CAPSULE STUDY;  Surgeon: [name], MD;  Location: [place];  Service: Gastroenterology;  Laterality: N/A;   GIVENS CAPSULE STUDY N/A 10/08/2017   Procedure: GIVENS CAPSULE STUDY;  Surgeon: Otis Brace, MD;  Location: [place];  Service: Gastroenterology;  Laterality: N/A;  endoscopic placement of capsule   GIVENS CAPSULE STUDY N/A 03/02/2020   Procedure: GIVENS CAPSULE STUDY;  Surgeon: Ronald Lobo, MD;  [facility];  Service: Endoscopy;  Laterality: N/A;   HEMOSTASIS CLIP PLACEMENT  12/04/2018   Procedure: HEMOSTASIS CLIP PLACEMENT;  Surgeon: Wilford Corner, MD;  Location[facility];  Service: Endoscopy;;   HOT HEMOSTASIS N/A 04/27/2014   Procedure: HOT HEMOSTASIS (ARGON PLASMA COAGULATION/BICAP);  Surgeon: Cleotis Nipper, MD;  Location: [facility] ENDOSCOPY;  Service: Endoscopy;  Laterality: N/A;   HOT HEMOSTASIS N/A 09/30/2017   Procedure: HOT HEMOSTASIS (ARGON PLASMA COAGULATION/BICAP);  Surgeon: [name], MD;  Location: [place];  Service: Gastroenterology;  Laterality: N/A;   HOT HEMOSTASIS N/A 10/01/2017   Procedure: HOT HEMOSTASIS (ARGON PLASMA COAGULATION/BICAP);  Surgeon: [name], MD;  Location: [place];  Service: Gastroenterology;  Laterality: N/A;   HOT HEMOSTASIS N/A 10/17/2017   Procedure: HOT HEMOSTASIS (ARGON PLASMA COAGULATION/BICAP);  Surgeon: Otis Brace, MD;  Location: [facility] ENDOSCOPY;  Service: Gastroenterology;  Laterality: N/A;   HOT HEMOSTASIS N/A 10/19/2017   Procedure: HOT HEMOSTASIS  (ARGON PLASMA COAGULATION/BICAP);  Surgeon: Otis Brace, MD;  Location: [facility] ENDOSCOPY;  Service: Gastroenterology;  Laterality: N/A;   HOT HEMOSTASIS N/A 03/02/2020   Procedure: HOT HEMOSTASIS (ARGON PLASMA COAGULATION/BICAP);  Surgeon: Ronald Lobo, MD;  Location: [facility];  Service: Endoscopy;  Laterality: N/A;   IR IMAGING GUIDED PORT INSERTION  07/08/2018   L shoulder Surgery  2011   POLYPECTOMY  03/02/2020   Procedure: POLYPECTOMY;  Surgeon: Ronald Lobo, MD;  Location: [facility] ENDOS[facility];  Service: Endoscopy;;   SCLEROTHERAPY  11/03/2020   Procedure: Clide Deutscher;  Surgeon: Wilford Corner, MD;  Location: [facility] ENDOSCOPY;  Service: Endoscopy;;   SUBMUCOSAL INJECTION  09/22/2017   Procedure: SUBMUCOSAL INJECTION;  Surgeon: Clarene Essex, MD;  Location: [place];  Service: Endoscopy;;   SUBMUCOSAL INJECTION  12/04/2018   Procedure: SUBMUCOSAL INJECTION;  Surgeon: Wilford Corner, MD;  Location: [facility] ENDOS[facility];  Service: Endoscopy;;    I have reviewed the social history and family history with the patient and they are unchanged from previous note.  ALLERGIES:  is allergic to feraheme [ferumoxytol], nsaids, tomato, iron (ferrous sulfate) [ferrous sulfate er], other, and wasp venom.  MEDICATIONS:  Current Outpatient Medications  Medication Sig Dispense Refill   Accu-Chek Softclix Lancets lancets Use to check blood sugar before breakfast and before dinner while on steroids (Patient taking differently: 1 each by Other route See admin instructions. Use to check blood sugar before  breakfast and before dinner while on steroids) 100 each 1   acitretin (SORIATANE) 10 MG capsule Take 10 mg by mouth daily.     ALPRAZolam (XANAX) 0.5 MG tablet TAKE 1 TABLET(0.5 MG) BY MOUTH AT BEDTIME AS NEEDED FOR ANXIETY 30 tablet 0   Aminocaproic Acid 1000 MG TABS Take 1 tablet (1,000 mg total) by mouth in the morning and at bedtime. 60 tablet 2   amLODipine (NORVASC) 10 MG tablet Take 1 tablet (10 mg  total) by mouth daily. 90 tablet 0   atorvastatin (LIPITOR) 80 MG tablet TAKE 1 TABLET(80 MG) BY MOUTH DAILY 30 tablet 3   diphenoxylate-atropine (LOMOTIL) 2.5-0.025 MG tablet 1 to 2 PO QID prn diarrhea 30 tablet 1   furosemide (LASIX) 40 MG tablet Take 40 mg by mouth daily.     glucose blood (ACCU-CHEK GUIDE) test strip Check blood sugar 2 times per day while on steroids before breakfast and dinner (Patient taking differently: 1 each by Other route See admin instructions. Check blood sugar 2 times per day while on steroids before breakfast and dinner) 50 each 3   hydrochlorothiazide (HYDRODIURIL) 12.5 MG tablet TAKE 1 TABLET(12.5 MG) BY MOUTH DAILY 90 tablet 1   lidocaine-prilocaine (EMLA) cream Apply 1 application topically as needed. 30 g 0   metFORMIN (GLUCOPHAGE) 500 MG tablet TAKE 1 TABLET(500 MG) BY MOUTH TWICE DAILY WITH A MEAL 180 tablet 3   methocarbamol (ROBAXIN) 500 MG tablet Take 1 tablet (500 mg total) by mouth every 8 (eight) hours as needed for muscle spasms (back pain/spasm). 7 tablet 0   metoprolol succinate (TOPROL-XL) 100 MG 24 hr tablet Take 100 mg by mouth daily.     pantoprazole (PROTONIX) 40 MG tablet TAKE 1 TABLET(40 MG) BY MOUTH TWICE DAILY 60 tablet 5   Podiatric Products (FLEXITOL HEEL BALM) OINT Apply 1 application topically as needed (dry skin).      potassium chloride (KLOR-CON) 20 MEQ packet Take 20 mEq by mouth daily. 30 packet 1   PROAIR HFA 108 (90 Base) MCG/ACT inhaler INHALE 1 TO 2 PUFFS INTO THE LUNGS EVERY 6 HOURS AS NEEDED FOR WHEEZING OR SHORTNESS OF BREATH 8.5 g 0   prochlorperazine (COMPAZINE) 10 MG tablet Take 1 tablet (10 mg total) by mouth every 6 (six) hours as needed for nausea or vomiting. 30 tablet 1   sertraline (ZOLOFT) 25 MG tablet Take 5 tablets (125 mg total) by mouth daily. 150 tablet 2   terbinafine (LAMISIL AT) 1 % cream Apply 1 application topically 2 (two) times daily. Both bottom and top of both feet and toes 36 g 0   traMADol (ULTRAM) 50  MG tablet Take 1 tablet (50 mg total) by mouth daily as needed for severe pain. 7 tablet 0   traZODone (DESYREL) 100 MG tablet TAKE 1 TABLET(100 MG) BY MOUTH AT BEDTIME 30 tablet 2   No current facility-administered medications for this visit.   Facility-Administered Medications Ordered in Other Visits  Medication Dose Route Frequency Provider Last Rate Last Admin   diphenhydrAMINE (BENADRYL) 25 mg capsule            sodium chloride flush (NS) 0.9 % injection 10 mL  10 mL Intracatheter PRN Truitt Merle, MD   10 mL at 08/14/20 1725    PHYSICAL EXAMINATION: ECOG PERFORMANCE STATUS: 2 - Symptomatic, <50% confined to bed  There were no vitals filed for this visit. Wt Readings from Last 3 Encounters:  03/16/21 244 lb (110.7 kg)  03/01/21 228  lb 8 oz (103.6 kg)  02/07/21 235 lb (106.6 kg)     GENERAL:alert, no distress and comfortable SKIN: skin color normal, no rashes or significant lesions EYES: normal, Conjunctiva are pink and non-injected, sclera clear  NEURO: alert & oriented x 3 with fluent speech  LABORATORY DATA:  I have reviewed the data as listed CBC Latest Ref Rng & Units 03/23/2021 03/16/2021 03/05/2021  WBC 4.0 - 10.5 K/uL 13.4(H) 12.6(H) 12.9(H)  Hemoglobin 12.0 - 15.0 g/dL 9.0(L) 6.9(LL) 8.9(L)  Hematocrit 36.0 - 46.0 % 30.0(L) 24.4(L) 28.8(L)  Platelets 150 - 400 K/uL 321 343 273     CMP Latest Ref Rng & Units 03/16/2021 02/09/2021 12/07/2020  Glucose 70 - 99 mg/dL 110(H) 105(H) 116(H)  BUN 6 - 20 mg/dL 10 8 13   Creatinine 0.44 - 1.00 mg/dL 0.81 0.83 1.21(H)  Sodium 135 - 145 mmol/L 142 141 136  Potassium 3.5 - 5.1 mmol/L 3.7 3.3(L) 3.4(L)  Chloride 98 - 111 mmol/L 108 106 103  CO2 22 - 32 mmol/L 24 25 24   Calcium 8.9 - 10.3 mg/dL 9.3 9.1 9.1  Total Protein 6.5 - 8.1 g/dL 7.2 7.0 7.3  Total Bilirubin 0.3 - 1.2 mg/dL <0.2(L) 0.3 0.5  Alkaline Phos 38 - 126 U/L 80 84 72  AST 15 - 41 U/L 13(L) 12(L) 13(L)  ALT 0 - 44 U/L 7 <5 6      RADIOGRAPHIC STUDIES: I have  personally reviewed the radiological images as listed and agreed with the findings in the report. No results found.    No orders of the defined types were placed in this encounter.  All questions were answered. The patient knows to call the clinic with any problems, questions or concerns. No barriers to learning was detected. The total time spent in the appointment was 30 minutes.     Truitt Merle, MD 03/23/2021   I, Wilburn Mylar, am acting as scribe for Truitt Merle, MD.   I have reviewed the above documentation for accuracy and completeness, and I agree with the above.

## 2021-03-23 NOTE — ED Triage Notes (Addendum)
Pt is coming from cancer center. ED nurse received report from [place], [place]. Pt c/o nose bleed starting just after 1300 today. Pt has a hx of nosebleeds. Pt also has a hx HHT. Pt is not on any blood thinners.

## 2021-03-23 NOTE — Discharge Instructions (Signed)
Call your primary care doctor or specialist as discussed in the next 2-3 days.   Return immediately back to the ER if:  Your symptoms worsen within the next 12-24 hours. You develop new symptoms such as new fevers, persistent vomiting, new pain, shortness of breath, or new weakness or numbness, or if you have any other concerns.  

## 2021-03-23 NOTE — ED Provider Notes (Signed)
[facility] Provider Note   CSN: [id number] Arrival date & time: 03/23/21  1452     History Chief Complaint  Patient presents with   Epistaxis    [name] is a 60 y.o. female.  Patient with history of hereditary hemorrhagic telangiectasia, presents to ER chief complaint of bloody nose.Peggy House  Describes bleeding is spontaneous no trauma involved.  She was in the hospital preparing for iron infusion.  Otherwise denies any recent illnesses.  No fever no cough no vomiting no diarrhea.      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    Patient Active Problem List   Diagnosis Date Noted   HHT (hereditary hemorrhagic telangiectasia) ([place]) 03/23/2021   Syncope 11/01/2020   Trapezius muscle spasm 07/22/2020   Acute GI bleeding 03/01/2020   Leg pain, bilateral 09/28/2019   Urge incontinence 09/28/2019   Pain due to onychomycosis of toenails of both feet 02/26/2019   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   Anemia due to GI blood loss 12/07/2018   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   Acute deep vein thrombosis (DVT) of right upper extremity ([place]) 12/07/2018   Hypokalemia    Port-A-Cath in place 09/18/2018   GAD (generalized anxiety disorder) 01/20/2018   Need for influenza vaccination 01/20/2018   Upper GI bleed 10/25/2017   Pulmonary artery hypertension ([place]) 10/19/2017   Symptomatic anemia 05/20/2017   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   Venous stasis dermatitis of both lower extremities  05/07/2017   Recurrent epistaxis 11/29/2016   Iron deficiency anemia 10/29/2016   Cough 05/13/2016   Osteoarthritis, multiple sites 06/05/2015   Insomnia 06/05/2015   Major depressive disorder, recurrent episode ([place]) 06/05/2015   Obesity, Class III, BMI 40-49.9 (morbid obesity) ([place]) 04/26/2014   GI bleed 04/25/2014   HLD (hyperlipidemia) 04/25/2014   Leg swelling 04/25/2014   Essential hypertension    Acute on chronic blood loss anemia 04/07/2014    Past Surgical History:  Procedure Laterality Date   ABDOMINAL HYSTERECTOMY     CARPAL TUNNEL RELEASE  05/13/2011   Procedure: CARPAL TUNNEL RELEASE;  Surgeon: Nita Sells, MD;  Location: [place];  Service: Orthopedics;  Laterality: Left;   COLONOSCOPY N/A 03/02/2020   Procedure: COLONOSCOPY;  Surgeon: Ronald Lobo, MD;  Location: [facility];  Service: Endoscopy;  Laterality: N/A;   COLONOSCOPY WITH PROPOFOL N/A 04/28/2014   Procedure: COLONOSCOPY WITH PROPOFOL;  Surgeon: Cleotis Nipper, MD;  Location: [facility] ENDOSCOPY;  Service: Endoscopy;  Laterality: N/A;   DG TOES*L*  2/10   rt   DILATION AND CURETTAGE OF UTERUS     ENTEROSCOPY N/A 10/17/2017   Procedure: ENTEROSCOPY;  Surgeon: Otis Brace, MD;  Location: [place];  Service: Gastroenterology;  Laterality: N/A;   ESOPHAGOGASTRODUODENOSCOPY N/A 04/10/2014   Procedure: ESOPHAGOGASTRODUODENOSCOPY (EGD);  Surgeon: [name], MD;  [facility] END[facility]COPY;  Service: Endoscopy;  Laterality: N/A;   ESOPHAGOGASTRODUODENOSCOPY N/A 05/10/2017   Procedure: ESOPHAGOGASTRODUODENOSCOPY (EGD);  Surgeon: Ronald Lobo, MD;  Location: [place];  Service: Endoscopy;  Laterality: N/A;   ESOPHAGOGASTRODUODENOSCOPY N/A 09/22/2017   Procedure: ESOPHAGOGASTRODUODENOSCOPY (EGD);  Surgeon: Clarene Essex, MD;  Location: [place];  Service: Endoscopy;  Laterality: N/A;  bedside   ESOPHAGOGASTRODUODENOSCOPY N/A 03/02/2020   Procedure:  ESOPHAGOGASTRODUODENOSCOPY (EGD);  Surgeon: Ronald Lobo, MD;  [facility];  Service: Endoscopy;  Laterality: N/A;   ESOPHAGOGASTRODUODENOSCOPY N/A 11/03/2020   Procedure: ESOPHAGOGASTRODUODENOSCOPY (EGD);  Surgeon: Wilford Corner, MD;  Location: [place];  Service: Endoscopy;  Laterality: N/A;   ESOPHAGOGASTRODUODENOSCOPY (EGD) WITH PROPOFOL N/A 04/27/2014   Procedure: ESOPHAGOGASTRODUODENOSCOPY (EGD) WITH PROPOFOL;  Surgeon: Cleotis Nipper, MD;  Location: [place];  Service: Endoscopy;  Laterality: N/A;  possible apc   ESOPHAGOGASTRODUODENOSCOPY (EGD) WITH PROPOFOL N/A 09/30/2017   Procedure: ESOPHAGOGASTRODUODENOSCOPY (EGD) WITH PROPOFOL;  Surgeon: [name], MD;  Location: [place];  Service: Gastroenterology;  Laterality: N/A;   ESOPHAGOGASTRODUODENOSCOPY (EGD) WITH PROPOFOL N/A 10/01/2017   Procedure: ESOPHAGOGASTRODUODENOSCOPY (EGD) WITH PROPOFOL;  Surgeon: [name], MD;  Location: [place];  Service: Gastroenterology;  Laterality: N/A;   ESOPHAGOGASTRODUODENOSCOPY (EGD) WITH PROPOFOL N/A 10/08/2017   Procedure: ESOPHAGOGASTRODUODENOSCOPY (EGD) WITH PROPOFOL;  Surgeon: Otis Brace, MD;  Location: [place];  Service: Gastroenterology;  Laterality: N/A;   ESOPHAGOGASTRODUODENOSCOPY (EGD) WITH PROPOFOL N/A 10/17/2017   Procedure: ESOPHAGOGASTRODUODENOSCOPY (EGD) WITH PROPOFOL;  Surgeon: Otis Brace, MD;  Location: [place];  Service: Gastroenterology;  Laterality: N/A;   ESOPHAGOGASTRODUODENOSCOPY (EGD) WITH PROPOFOL N/A 10/19/2017   Procedure: ESOPHAGOGASTRODUODENOSCOPY (EGD) WITH PROPOFOL;  Surgeon: Otis Brace, MD;  Location: [place];  Service: Gastroenterology;  Laterality: N/A;   ESOPHAGOGASTRODUODENOSCOPY (EGD) WITH PROPOFOL N/A 12/04/2018   Procedure: ESOPHAGOGASTRODUODENOSCOPY (EGD) WITH PROPOFOL;  Surgeon: Wilford Corner, MD;  [facility];  Service: Endoscopy;  Laterality: N/A;   GIVENS CAPSULE STUDY N/A  10/02/2017   Procedure: GIVENS CAPSULE STUDY;  Surgeon: [name], MD;  Location: [place];  Service: Gastroenterology;  Laterality: N/A;   GIVENS CAPSULE STUDY N/A 10/08/2017   Procedure: GIVENS CAPSULE STUDY;  Surgeon: Otis Brace, MD;  Location: [place];  Service: Gastroenterology;  Laterality: N/A;  endoscopic placement of capsule   GIVENS CAPSULE STUDY N/A 03/02/2020   Procedure: GIVENS CAPSULE STUDY;  Surgeon: Ronald Lobo, MD;  [facility];  Service: Endoscopy;  Laterality: N/A;   HEMOSTASIS CLIP PLACEMENT  12/04/2018   Procedure: HEMOSTASIS CLIP PLACEMENT;  Surgeon: Wilford Corner, MD;  [facility];  Service: Endoscopy;;   HOT HEMOSTASIS N/A 04/27/2014   Procedure: HOT HEMOSTASIS (ARGON PLASMA COAGULATION/BICAP);  Surgeon: Cleotis Nipper, MD;  Location: [facility] ENDOSCOPY;  Service: Endoscopy;  Laterality: N/A;   HOT HEMOSTASIS N/A 09/30/2017   Procedure: HOT HEMOSTASIS (ARGON PLASMA COAGULATION/BICAP);  Surgeon: [name], MD;  Location: [place];  Service: Gastroenterology;  Laterality: N/A;   HOT HEMOSTASIS N/A 10/01/2017   Procedure: HOT HEMOSTASIS (ARGON PLASMA COAGULATION/BICAP);  Surgeon: [name], MD;  Location: [place];  Service: Gastroenterology;  Laterality: N/A;   HOT HEMOSTASIS N/A 10/17/2017   Procedure: HOT HEMOSTASIS (ARGON PLASMA COAGULATION/BICAP);  Surgeon: Otis Brace, MD;  Location[facility]COPY;  Service: Gastroenterology;  Laterality: N/A;   HOT HEMOSTASIS N/A 10/19/2017   Procedure: HOT HEMOSTASIS (ARGON PLASMA COAGULATION/BICAP);  Surgeon: Otis Brace, MD;  Location: [facility] ENDOSCOPY;  Service: Gastroenterology;  Laterality: N/A;   HOT HEMOSTASIS N/A 03/02/2020   Procedure: HOT HEMOSTASIS (ARGON PLASMA COAGULATION/BICAP);  Surgeon: Ronald Lobo, MD;  Location[facility];  Service: Endoscopy;  Laterality: N/A;   IR IMAGING GUIDED PORT INSERTION  07/08/2018   L shoulder  Surgery  2011   POLYPECTOMY   03/02/2020   Procedure: POLYPECTOMY;  Surgeon: Ronald Lobo, MD;  [facility];  Service: Endoscopy;;   SCLEROTHERAPY  11/03/2020   Procedure: Clide Deutscher;  Surgeon: Wilford Corner, MD;  Location: [facility] ENDOSCOPY;  Service: Endoscopy;;   SUBMUCOSAL INJECTION  09/22/2017   Procedure: SUBMUCOSAL INJECTION;  Surgeon: Clarene Essex, MD;  Location: [place];  Service: Endoscopy;;   SUBMUCOSAL INJECTION  12/04/2018   Procedure: SUBMUCOSAL INJECTION;  Surgeon: Wilford Corner, MD;  [facility];  Service: Endoscopy;;     OB History   No obstetric history on file.     Family History  Problem Relation Age of Onset   Cancer Mother        Ovarian   Ovarian cancer Mother    Diabetes Mother    Kidney disease Mother    Bleeding Disorder Mother    Diabetes type II Sister    Bleeding Disorder Sister    Diabetes Sister    Colon cancer Maternal Grandfather    Crohn's disease Maternal Grandfather    Stomach cancer Maternal Grandmother    Kidney disease Son    Bleeding Disorder Son    Dysmenorrhea Neg Hx     Social History   Tobacco Use   Smoking status: Every Day    Packs/day: 0.50    Years: 30.00    Pack years: 15.00    Types: Cigarettes   Smokeless tobacco: Former    Types: Snuff    Quit date: 1981   Tobacco comments:    1/2 PPD  Vaping Use   Vaping Use: Never used  Substance Use Topics   Alcohol use: No    Alcohol/week: 0.0 standard drinks   Drug use: No    Comment: last cocaine-2010    Home Medications Prior to Admission medications   Medication Sig Start Date End Date Taking? Authorizing Provider  Accu-Chek Softclix Lancets lancets Use to check blood sugar before breakfast and before dinner while on steroids Patient taking differently: 1 each by Other route See admin instructions. Use to check blood sugar before breakfast and before dinner while on steroids 09/27/19   [name], MD  acitretin (SORIATANE) 10 MG capsule Take 10 mg by  mouth daily. 01/03/20   [provider]  ALPRAZolam Duanne Moron) 0.5 MG tablet TAKE 1 TABLET(0.5 MG) BY MOUTH AT BEDTIME AS NEEDED FOR ANXIETY 03/01/21   Truitt Merle, MD  Aminocaproic Acid 1000 MG TABS Take 1 tablet (1,000 mg total) by mouth in the morning and at bedtime. 01/31/21   Truitt Merle, MD  amLODipine (NORVASC) 10 MG tablet Take 1 tablet (10 mg total) by mouth daily. 03/01/21   Truitt Merle, MD  atorvastatin (LIPITOR) 80 MG tablet TAKE 1 TABLET(80 MG) BY MOUTH DAILY 12/16/19   Truitt Merle, MD  diphenoxylate-atropine (LOMOTIL) 2.5-0.025 MG tablet 1 to 2 PO QID prn diarrhea 04/15/19   Tanner, Lyndon Code., PA-C  furosemide (LASIX) 40 MG tablet Take 40 mg by mouth daily.    [provider]  glucose blood (ACCU-CHEK GUIDE) test strip Check blood sugar 2 times per day while on steroids before breakfast and dinner Patient taking differently: 1 each by Other route See admin instructions. Check blood sugar 2 times per day while on steroids before breakfast and dinner 09/27/19   [name], MD  hydrochlorothiazide (HYDRODIURIL) 12.5 MG tablet TAKE 1 TABLET(12.5 MG) BY MOUTH DAILY 11/30/20   Marianna Payment, MD  lidocaine-prilocaine (EMLA) cream Apply 1  application topically as needed. 07/17/18   Truitt Merle, MD  metFORMIN (GLUCOPHAGE) 500 MG tablet TAKE 1 TABLET(500 MG) BY MOUTH TWICE DAILY WITH A MEAL 10/23/20   Sanjuan Dame, MD  methocarbamol (ROBAXIN) 500 MG tablet Take 1 tablet (500 mg total) by mouth every 8 (eight) hours as needed for muscle spasms (back pain/spasm). 11/05/20   Orvis Brill, MD  metoprolol succinate (TOPROL-XL) 100 MG 24 hr tablet Take 100 mg by mouth daily.    [provider]  pantoprazole (PROTONIX) 40 MG tablet TAKE 1 TABLET(40 MG) BY MOUTH TWICE DAILY 08/24/20   [name], MD  Podiatric Products (FLEXITOL HEEL BALM) OINT Apply 1 application topically as needed (dry skin).  06/10/19   [provider]  potassium chloride (KLOR-CON) 20 MEQ packet  Take 20 mEq by mouth daily. 05/23/20   Truitt Merle, MD  PROAIR HFA [phone] Base) MCG/ACT inhaler INHALE 1 TO 2 PUFFS INTO THE LUNGS EVERY 6 HOURS AS NEEDED FOR WHEEZING OR SHORTNESS OF BREATH 11/10/20   Truitt Merle, MD  prochlorperazine (COMPAZINE) 10 MG tablet Take 1 tablet (10 mg total) by mouth every 6 (six) hours as needed for nausea or vomiting. 10/14/18   Alla Feeling, NP  sertraline (ZOLOFT) 25 MG tablet Take 5 tablets (125 mg total) by mouth daily. 12/21/20 03/21/21  Sanjuan Dame, MD  terbinafine (LAMISIL AT) 1 % cream Apply 1 application topically 2 (two) times daily. Both bottom and top of both feet and toes 06/16/20   Seawell, Jaimie A, DO  traMADol (ULTRAM) 50 MG tablet Take 1 tablet (50 mg total) by mouth daily as needed for severe pain. 09/27/20   Alla Feeling, NP  traZODone (DESYREL) 100 MG tablet TAKE 1 TABLET(100 MG) BY MOUTH AT BEDTIME 03/01/21   Truitt Merle, MD    Allergies    Feraheme [ferumoxytol], Nsaids, Tomato, Iron (ferrous sulfate) [ferrous sulfate er], Other, and Wasp venom  Review of Systems   Review of Systems  Constitutional:  Negative for fever.  HENT:  Negative for ear pain.   Eyes:  Negative for pain.  Respiratory:  Negative for cough.   Cardiovascular:  Negative for chest pain.  Gastrointestinal:  Negative for abdominal pain.  Genitourinary:  Negative for flank pain.  Musculoskeletal:  Negative for back pain.  Skin:  Negative for rash.  Neurological:  Negative for headaches.   Physical Exam Updated Vital Signs BP 134/73 (BP Location: Left Arm)   Pulse 69   Temp 98.3 F (36.8 C) (Oral)   Resp (!) 21   Ht 5' 5.5" (1.664 m)   Wt 100.7 kg   SpO2 100%   BMI 36.38 kg/m   Physical Exam Constitutional:      General: She is not in acute distress.    Appearance: Normal appearance.  HENT:     Head: Normocephalic.     Nose: Nose normal.     Comments: Brisk bleeding seen in the left nostril. Eyes:     Extraocular Movements: Extraocular movements intact.   Cardiovascular:     Rate and Rhythm: Normal rate.  Pulmonary:     Effort: Pulmonary effort is normal.  Musculoskeletal:        General: Normal range of motion.     Cervical back: Normal range of motion.  Neurological:     General: No focal deficit present.     Mental Status: She is alert. Mental status is at baseline.    ED Results / Procedures /  Treatments   Labs (all labs ordered are listed, but only abnormal results are displayed) Labs Reviewed - No data to display  EKG None  Radiology No results found.  Procedures .Epistaxis Management  Date/Time: 03/23/2021 4:14 PM Performed by: Luna Fuse, MD Authorized by: Luna Fuse, MD   Comments:     TXA soaked packing applied to left nostril for 1-1/2 hours.  Subsequently packing removed with no active bleeding.   Medications Ordered in ED Medications  oxymetazoline (AFRIN) 0.05 % nasal spray 1 spray (1 spray Each Nare Given by Other 03/23/21 1516)  tranexamic acid (CYKLOKAPRON) 1000 MG/10ML topical solution 500 mg (500 mg Topical Given by Other 03/23/21 1515)    ED Course  I have reviewed the triage vital signs and the nursing notes.  Pertinent labs & imaging results that were available during my care of the patient were reviewed by me and considered in my medical decision making (see chart for details).    MDM Rules/Calculators/A&P                           Left nostril packed with gauze soaked in TXA with nose clamp.  Observed 1 hour afterwards with no active bleeding.  Packing removed and clip removed with continued no active bleeding.  Patient is comfortable going home.  Advising immediate return if active bleeding or any additional concerns, otherwise advised follow-up with her doctor within 3 to 4 days.  Final Clinical Impression(s) / ED Diagnoses Final diagnoses:  Epistaxis    Rx / DC Orders ED Discharge Orders     None        Luna Fuse, MD 03/23/21 1615

## 2021-03-24 ENCOUNTER — Inpatient Hospital Stay: Payer: Medicaid Other

## 2021-03-24 ENCOUNTER — Encounter: Payer: Self-pay | Admitting: Hematology

## 2021-03-24 VITALS — BP 122/68 | HR 73 | Temp 97.3°F | Resp 18

## 2021-03-24 DIAGNOSIS — Z5112 Encounter for antineoplastic immunotherapy: Secondary | ICD-10-CM | POA: Diagnosis not present

## 2021-03-24 DIAGNOSIS — Z95828 Presence of other vascular implants and grafts: Secondary | ICD-10-CM

## 2021-03-24 DIAGNOSIS — D500 Iron deficiency anemia secondary to blood loss (chronic): Secondary | ICD-10-CM

## 2021-03-24 MED ORDER — SODIUM CHLORIDE 0.9% FLUSH
10.0000 mL | Freq: Once | INTRAVENOUS | Status: AC
  Administered 2021-03-24: 10 mL

## 2021-03-24 MED ORDER — SODIUM CHLORIDE 0.9 % IV SOLN
250.0000 mg | Freq: Once | INTRAVENOUS | Status: AC
  Administered 2021-03-24: 250 mg via INTRAVENOUS
  Filled 2021-03-24 (×2): qty 20

## 2021-03-24 MED ORDER — SODIUM CHLORIDE 0.9 % IV SOLN: INTRAVENOUS | Status: DC

## 2021-03-24 MED ORDER — HEPARIN SOD (PORK) LOCK FLUSH 100 UNIT/ML IV SOLN
500.0000 [IU] | Freq: Once | INTRAVENOUS | Status: AC
  Administered 2021-03-24: 500 [IU]

## 2021-03-26 ENCOUNTER — Telehealth: Payer: Self-pay | Admitting: Hematology

## 2021-03-26 ENCOUNTER — Telehealth: Payer: Self-pay

## 2021-03-26 NOTE — Telephone Encounter (Signed)
Scheduled follow-up appointments per 12/9 los. Patient is aware. Mailed calendar.

## 2021-03-26 NOTE — Telephone Encounter (Signed)
Transition Care Management Follow-up Telephone Call Date of discharge and from where: 03/23/2021-[place]  How have you been since you were released from the hospital? Patient stated she is doing ok.  Any questions or concerns? No  Items Reviewed: Did the pt receive and understand the discharge instructions provided? Yes  Medications obtained and verified?  No medications given at discharge  Other? No  Any new allergies since your discharge? No  Dietary orders reviewed? No Do you have support at home? Yes   Home Care and Equipment/Supplies: Were home health services ordered? not applicable If so, what is the name of the agency?  N/A Has the agency set up a time to come to the patient's home? not applicable Were any new equipment or medical supplies ordered?  No What is the name of the medical supply agency? N/A Were you able to get the supplies/equipment? not applicable Do you have any questions related to the use of the equipment or supplies? No  Functional Questionnaire: (I = Independent and D = Dependent) ADLs: I  Bathing/Dressing- I  Meal Prep- I  Eating- I  Maintaining continence- I  Transferring/Ambulation- I  Managing Meds- I  Follow up appointments reviewed:  PCP Hospital f/u appt confirmed? Yes  Scheduled to see PCP on 03/27/2021 @ 2:30PM. [facility] f/u appt confirmed? No   Are transportation arrangements needed? No  If their condition worsens, is the pt aware to call PCP or go to the Emergency Dept.? Yes Was the patient provided with contact information for the PCP's office or ED? Yes Was to pt encouraged to call back with questions or concerns? Yes

## 2021-03-27 ENCOUNTER — Other Ambulatory Visit: Payer: Self-pay | Admitting: Hematology

## 2021-03-27 ENCOUNTER — Other Ambulatory Visit: Payer: Self-pay | Admitting: Internal Medicine

## 2021-03-27 ENCOUNTER — Ambulatory Visit: Payer: Medicaid Other | Admitting: Behavioral Health

## 2021-03-27 ENCOUNTER — Telehealth: Payer: Self-pay | Admitting: Behavioral Health

## 2021-03-27 DIAGNOSIS — K922 Gastrointestinal hemorrhage, unspecified: Secondary | ICD-10-CM

## 2021-03-27 NOTE — Telephone Encounter (Signed)
Lft 2 msgs for Pt about today's telehealth session. Alerted Pt we will r/s her in early Jan unless she calls earlier & requests sooner appt time.   Dr. Theodis Shove

## 2021-03-27 NOTE — Telephone Encounter (Signed)
No follow up appointment scheduled.  Here today for appointment with Dr. Theodis Shove.

## 2021-03-29 ENCOUNTER — Other Ambulatory Visit: Payer: Self-pay

## 2021-03-30 ENCOUNTER — Inpatient Hospital Stay: Payer: Medicaid Other

## 2021-03-30 ENCOUNTER — Other Ambulatory Visit: Payer: Self-pay | Admitting: Hematology

## 2021-03-30 ENCOUNTER — Other Ambulatory Visit: Payer: Medicaid Other

## 2021-03-30 ENCOUNTER — Other Ambulatory Visit: Payer: Self-pay

## 2021-03-30 VITALS — BP 128/71 | HR 74 | Temp 98.1°F | Resp 18

## 2021-03-30 DIAGNOSIS — Q2733 Arteriovenous malformation of digestive system vessel: Secondary | ICD-10-CM

## 2021-03-30 DIAGNOSIS — D500 Iron deficiency anemia secondary to blood loss (chronic): Secondary | ICD-10-CM

## 2021-03-30 DIAGNOSIS — Z95828 Presence of other vascular implants and grafts: Secondary | ICD-10-CM

## 2021-03-30 DIAGNOSIS — Z5112 Encounter for antineoplastic immunotherapy: Secondary | ICD-10-CM | POA: Diagnosis not present

## 2021-03-30 DIAGNOSIS — I780 Hereditary hemorrhagic telangiectasia: Secondary | ICD-10-CM

## 2021-03-30 DIAGNOSIS — D649 Anemia, unspecified: Secondary | ICD-10-CM

## 2021-03-30 LAB — CBC WITH DIFFERENTIAL (CANCER CENTER ONLY)
Abs Immature Granulocytes: 0.05 10*3/uL (ref 0.00–0.07)
Basophils Absolute: 0 10*3/uL (ref 0.0–0.1)
Basophils Relative: 0 %
Eosinophils Absolute: 0.1 10*3/uL (ref 0.0–0.5)
Eosinophils Relative: 1 %
HCT: 28.2 % — ABNORMAL LOW (ref 36.0–46.0)
Hemoglobin: 8.4 g/dL — ABNORMAL LOW (ref 12.0–15.0)
Immature Granulocytes: 0 %
Lymphocytes Relative: 12 %
Lymphs Abs: 1.6 10*3/uL (ref 0.7–4.0)
MCH: 26.3 pg (ref 26.0–34.0)
MCHC: 29.8 g/dL — ABNORMAL LOW (ref 30.0–36.0)
MCV: 88.1 fL (ref 80.0–100.0)
Monocytes Absolute: 0.7 10*3/uL (ref 0.1–1.0)
Monocytes Relative: 5 %
Neutro Abs: 11.6 10*3/uL — ABNORMAL HIGH (ref 1.7–7.7)
Neutrophils Relative %: 82 %
Platelet Count: 263 10*3/uL (ref 150–400)
RBC: 3.2 MIL/uL — ABNORMAL LOW (ref 3.87–5.11)
RDW: 20.1 % — ABNORMAL HIGH (ref 11.5–15.5)
WBC Count: 14.1 10*3/uL — ABNORMAL HIGH (ref 4.0–10.5)
nRBC: 0 % (ref 0.0–0.2)

## 2021-03-30 LAB — CMP (CANCER CENTER ONLY)
ALT: 5 U/L (ref 0–44)
AST: 14 U/L — ABNORMAL LOW (ref 15–41)
Albumin: 3.5 g/dL (ref 3.5–5.0)
Alkaline Phosphatase: 78 U/L (ref 38–126)
Anion gap: 9 (ref 5–15)
BUN: 9 mg/dL (ref 6–20)
CO2: 25 mmol/L (ref 22–32)
Calcium: 9 mg/dL (ref 8.9–10.3)
Chloride: 107 mmol/L (ref 98–111)
Creatinine: 0.79 mg/dL (ref 0.44–1.00)
GFR, Estimated: >60 mL/min (ref >60)
Glucose, Bld: 101 mg/dL — ABNORMAL HIGH (ref 70–99)
Potassium: 3.7 mmol/L (ref 3.5–5.1)
Sodium: 141 mmol/L (ref 135–145)
Total Bilirubin: 0.2 mg/dL — ABNORMAL LOW (ref 0.3–1.2)
Total Protein: 7.6 g/dL (ref 6.5–8.1)

## 2021-03-30 LAB — TOTAL PROTEIN, URINE DIPSTICK: Protein, ur: 100 mg/dL — AB

## 2021-03-30 LAB — SAMPLE TO BLOOD BANK

## 2021-03-30 MED ORDER — SODIUM CHLORIDE 0.9% FLUSH
10.0000 mL | Freq: Once | INTRAVENOUS | Status: AC
  Administered 2021-03-30: 10 mL

## 2021-03-30 MED ORDER — SODIUM CHLORIDE 0.9 % IV SOLN
250.0000 mg | INTRAVENOUS | Status: DC
  Filled 2021-03-30: qty 20

## 2021-03-30 MED ORDER — SODIUM CHLORIDE 0.9% FLUSH
10.0000 mL | INTRAVENOUS | Status: DC | PRN
  Administered 2021-03-30: 10 mL

## 2021-03-30 MED ORDER — SODIUM CHLORIDE 0.9 % IV SOLN
5.0000 mg/kg | Freq: Once | INTRAVENOUS | Status: AC
  Administered 2021-03-30: 500 mg via INTRAVENOUS
  Filled 2021-03-30: qty 16

## 2021-03-30 MED ORDER — SODIUM CHLORIDE 0.9 % IV SOLN: Freq: Once | INTRAVENOUS | Status: AC

## 2021-03-30 MED ORDER — SODIUM CHLORIDE 0.9 % IV SOLN
250.0000 mg | INTRAVENOUS | Status: DC
  Administered 2021-03-30: 250 mg via INTRAVENOUS
  Filled 2021-03-30: qty 20

## 2021-03-30 MED ORDER — HEPARIN SOD (PORK) LOCK FLUSH 100 UNIT/ML IV SOLN
500.0000 [IU] | Freq: Once | INTRAVENOUS | Status: AC | PRN
  Administered 2021-03-30: 500 [IU]

## 2021-03-30 NOTE — Progress Notes (Signed)
Ok to proceed with treatment without lab results per Dr. Burr Medico.

## 2021-03-30 NOTE — Patient Instructions (Signed)
[facility] ONCOL[facility]  Discharge Instructions: Thank you for choosing [facility] to provide your oncology and hematology care.   If you have a lab appointment with the [facility], please go directly to the [facility] and check in at the registration area.   Wear comfortable clothing and clothing appropriate for easy access to any Portacath or PICC line.   We strive to give you quality time with your provider. You may need to reschedule your appointment if you arrive late (15 or more minutes).  Arriving late affects you and other patients whose appointments are after yours.  Also, if you miss three or more appointments without notifying the office, you may be dismissed from the clinic at the providers discretion.      For prescription refill requests, have your pharmacy contact our office and allow 72 hours for refills to be completed.    Today you received the following chemotherapy and/or immunotherapy agents Ferrlecit and Zirabev      To help prevent nausea and vomiting after your treatment, we encourage you to take your nausea medication as directed.  BELOW ARE SYMPTOMS THAT SHOULD BE REPORTED IMMEDIATELY: *FEVER GREATER THAN 100.4 F (38 C) OR HIGHER *CHILLS OR SWEATING *NAUSEA AND VOMITING THAT IS NOT CONTROLLED WITH YOUR NAUSEA MEDICATION *UNUSUAL SHORTNESS OF BREATH *UNUSUAL BRUISING OR BLEEDING *URINARY PROBLEMS (pain or burning when urinating, or frequent urination) *BOWEL PROBLEMS (unusual diarrhea, constipation, pain near the anus) TENDERNESS IN MOUTH AND THROAT WITH OR WITHOUT PRESENCE OF ULCERS (sore throat, sores in mouth, or a toothache) UNUSUAL RASH, SWELLING OR PAIN  UNUSUAL VAGINAL DISCHARGE OR ITCHING   Items with * indicate a potential emergency and should be followed up as soon as possible or go to the Emergency Department if any problems should occur.  Please show the CHEMOTHERAPY ALERT CARD or IMMUNOTHERAPY ALERT CARD at  check-in to the Emergency Department and triage nurse.  Should you have questions after your visit or need to cancel or reschedule your appointment, please contact [facility]  Dept: [phone]  and follow the prompts.  Office hours are 8:00 a.m. to 4:30 p.m. Monday - Friday. Please note that voicemails left after 4:00 p.m. may not be returned until the following business day.  We are closed weekends and major holidays. You have access to a nurse at all times for urgent questions. Please call the main number to the clinic Dept: [phone] and follow the prompts.   For any non-urgent questions, you may also contact your provider using MyChart. We now offer e-Visits for anyone 45 and older to request care online for non-urgent symptoms. For details visit [email or web address].   Also download the MyChart app! Go to the app store, search "MyChart", open the app, select [facility], and log in with your MyChart username and password.  Due to Covid, a mask is required upon entering the hospital/clinic. If you do not have a mask, one will be given to you upon arrival. For doctor visits, patients may have 1 support person aged 52 or older with them. For treatment visits, patients cannot have anyone with them due to current Covid guidelines and our immunocompromised population.

## 2021-04-06 ENCOUNTER — Inpatient Hospital Stay: Payer: Medicaid Other

## 2021-04-06 ENCOUNTER — Other Ambulatory Visit: Payer: Medicaid Other

## 2021-04-13 ENCOUNTER — Other Ambulatory Visit: Payer: Self-pay

## 2021-04-13 ENCOUNTER — Telehealth: Payer: Self-pay

## 2021-04-13 ENCOUNTER — Other Ambulatory Visit: Payer: Medicaid Other

## 2021-04-13 ENCOUNTER — Inpatient Hospital Stay: Payer: Medicaid Other

## 2021-04-13 ENCOUNTER — Encounter: Payer: Self-pay | Admitting: Hematology

## 2021-04-13 VITALS — BP 121/63 | HR 63 | Temp 98.7°F | Resp 18 | Wt 236.0 lb

## 2021-04-13 DIAGNOSIS — I780 Hereditary hemorrhagic telangiectasia: Secondary | ICD-10-CM

## 2021-04-13 DIAGNOSIS — D500 Iron deficiency anemia secondary to blood loss (chronic): Secondary | ICD-10-CM

## 2021-04-13 DIAGNOSIS — Z95828 Presence of other vascular implants and grafts: Secondary | ICD-10-CM

## 2021-04-13 DIAGNOSIS — D649 Anemia, unspecified: Secondary | ICD-10-CM

## 2021-04-13 DIAGNOSIS — Q2733 Arteriovenous malformation of digestive system vessel: Secondary | ICD-10-CM

## 2021-04-13 DIAGNOSIS — Z5112 Encounter for antineoplastic immunotherapy: Secondary | ICD-10-CM | POA: Diagnosis not present

## 2021-04-13 LAB — CBC WITH DIFFERENTIAL (CANCER CENTER ONLY)
Abs Immature Granulocytes: 0.03 10*3/uL (ref 0.00–0.07)
Basophils Absolute: 0 10*3/uL (ref 0.0–0.1)
Basophils Relative: 0 %
Eosinophils Absolute: 0.1 10*3/uL (ref 0.0–0.5)
Eosinophils Relative: 1 %
HCT: 22.7 % — ABNORMAL LOW (ref 36.0–46.0)
Hemoglobin: 6.8 g/dL — CL (ref 12.0–15.0)
Immature Granulocytes: 0 %
Lymphocytes Relative: 13 %
Lymphs Abs: 1.3 10*3/uL (ref 0.7–4.0)
MCH: 25.9 pg — ABNORMAL LOW (ref 26.0–34.0)
MCHC: 30 g/dL (ref 30.0–36.0)
MCV: 86.3 fL (ref 80.0–100.0)
Monocytes Absolute: 0.5 10*3/uL (ref 0.1–1.0)
Monocytes Relative: 5 %
Neutro Abs: 8 10*3/uL — ABNORMAL HIGH (ref 1.7–7.7)
Neutrophils Relative %: 81 %
Platelet Count: 353 10*3/uL (ref 150–400)
RBC: 2.63 MIL/uL — ABNORMAL LOW (ref 3.87–5.11)
RDW: 20.4 % — ABNORMAL HIGH (ref 11.5–15.5)
WBC Count: 10 10*3/uL (ref 4.0–10.5)
nRBC: 0 % (ref 0.0–0.2)

## 2021-04-13 LAB — PREPARE RBC (CROSSMATCH)

## 2021-04-13 LAB — CMP (CANCER CENTER ONLY)
ALT: 5 U/L (ref 0–44)
AST: 12 U/L — ABNORMAL LOW (ref 15–41)
Albumin: 3.9 g/dL (ref 3.5–5.0)
Alkaline Phosphatase: 53 U/L (ref 38–126)
Anion gap: 9 (ref 5–15)
BUN: 13 mg/dL (ref 6–20)
CO2: 25 mmol/L (ref 22–32)
Calcium: 9.1 mg/dL (ref 8.9–10.3)
Chloride: 107 mmol/L (ref 98–111)
Creatinine: 0.93 mg/dL (ref 0.44–1.00)
GFR, Estimated: >60 mL/min (ref >60)
Glucose, Bld: 94 mg/dL (ref 70–99)
Potassium: 3.7 mmol/L (ref 3.5–5.1)
Sodium: 141 mmol/L (ref 135–145)
Total Bilirubin: 0.3 mg/dL (ref 0.3–1.2)
Total Protein: 7.4 g/dL (ref 6.5–8.1)

## 2021-04-13 LAB — SAMPLE TO BLOOD BANK

## 2021-04-13 LAB — TOTAL PROTEIN, URINE DIPSTICK: Protein, ur: 30 mg/dL — AB

## 2021-04-13 MED ORDER — SODIUM CHLORIDE 0.9 % IV SOLN: 250.0000 mg | INTRAVENOUS | Status: DC

## 2021-04-13 MED ORDER — HEPARIN SOD (PORK) LOCK FLUSH 100 UNIT/ML IV SOLN
500.0000 [IU] | Freq: Once | INTRAVENOUS | Status: AC | PRN
  Administered 2021-04-13: 17:00:00 500 [IU]

## 2021-04-13 MED ORDER — SODIUM CHLORIDE 0.9 % IV SOLN: Freq: Once | INTRAVENOUS | Status: AC

## 2021-04-13 MED ORDER — SODIUM CHLORIDE 0.9% FLUSH
10.0000 mL | Freq: Once | INTRAVENOUS | Status: AC
  Administered 2021-04-13: 13:00:00 10 mL

## 2021-04-13 MED ORDER — SODIUM CHLORIDE 0.9 % IV SOLN
250.0000 mg | Freq: Once | INTRAVENOUS | Status: DC
  Filled 2021-04-13: qty 20

## 2021-04-13 MED ORDER — SODIUM CHLORIDE 0.9 % IV SOLN
5.0000 mg/kg | Freq: Once | INTRAVENOUS | Status: AC
  Administered 2021-04-13: 17:00:00 500 mg via INTRAVENOUS
  Filled 2021-04-13: qty 4

## 2021-04-13 MED ORDER — SODIUM CHLORIDE 0.9 % IV SOLN
250.0000 mg | Freq: Once | INTRAVENOUS | Status: AC
  Administered 2021-04-13: 14:00:00 250 mg via INTRAVENOUS
  Filled 2021-04-13: qty 20

## 2021-04-13 MED ORDER — SODIUM CHLORIDE 0.9% FLUSH
10.0000 mL | INTRAVENOUS | Status: DC | PRN
  Administered 2021-04-13: 17:00:00 10 mL

## 2021-04-13 NOTE — Progress Notes (Signed)
[place] in Blood Bank confirmed receipt of T&S and Prepare orders for transfusion on 04/14/2021.

## 2021-04-13 NOTE — Telephone Encounter (Signed)
CRITICAL VALUE STICKER  CRITICAL VALUE: Hgb = 6.8  RECEIVER (on-site recipient of call): Yetta Glassman, [place] NOTIFIED: 04/13/21 at 1:30pm  MESSENGER (representative from lab): Ulice Dash  MD NOTIFIED: Cira Rue, NP  TIME OF NOTIFICATION: 04/13/21 at 1:32pm  RESPONSE: Notification given to Dr. Burr Medico for follow-up with the pt.

## 2021-04-13 NOTE — Patient Instructions (Signed)
[facility]   °Discharge Instructions: °Thank you for choosing [facility] to provide your oncology and hematology care.  ° °If you have a lab appointment with the Cancer Center, please go directly to the Cancer Center and check in at the registration area. °  °Wear comfortable clothing and clothing appropriate for easy access to any Portacath or PICC line.  ° °We strive to give you quality time with your provider. You may need to reschedule your appointment if you arrive late (15 or more minutes).  Arriving late affects you and other patients whose appointments are after yours.  Also, if you miss three or more appointments without notifying the office, you may be dismissed from the clinic at the provider’s discretion.    °  °For prescription refill requests, have your pharmacy contact our office and allow 72 hours for refills to be completed.   ° °Today you received the following chemotherapy and/or immunotherapy agents: avastin  °  °To help prevent nausea and vomiting after your treatment, we encourage you to take your nausea medication as directed. ° °BELOW ARE SYMPTOMS THAT SHOULD BE REPORTED IMMEDIATELY: °*FEVER GREATER THAN 100.4 F (38 °C) OR HIGHER °*CHILLS OR SWEATING °*NAUSEA AND VOMITING THAT IS NOT CONTROLLED WITH YOUR NAUSEA MEDICATION °*UNUSUAL SHORTNESS OF BREATH °*UNUSUAL BRUISING OR BLEEDING °*URINARY PROBLEMS (pain or burning when urinating, or frequent urination) °*BOWEL PROBLEMS (unusual diarrhea, constipation, pain near the anus) °TENDERNESS IN MOUTH AND THROAT WITH OR WITHOUT PRESENCE OF ULCERS (sore throat, sores in mouth, or a toothache) °UNUSUAL RASH, SWELLING OR PAIN  °UNUSUAL VAGINAL DISCHARGE OR ITCHING  ° °Items with * indicate a potential emergency and should be followed up as soon as possible or go to the Emergency Department if any problems should occur. ° °Please show the CHEMOTHERAPY ALERT CARD or IMMUNOTHERAPY ALERT CARD at check-in to  the Emergency Department and triage nurse. ° °Should you have questions after your visit or need to cancel or reschedule your appointment, please contact [facility]  Dept: [phone]  and follow the prompts.  Office hours are 8:00 a.m. to 4:30 p.m. Monday - Friday. Please note that voicemails left after 4:00 p.m. may not be returned until the following business day.  We are closed weekends and major holidays. You have access to a nurse at all times for urgent questions. Please call the main number to the clinic Dept: [phone] and follow the prompts. ° ° °For any non-urgent questions, you may also contact your provider using MyChart. We now offer e-Visits for anyone 18 and older to request care online for non-urgent symptoms. For details visit [email or web address]. °  °Also download the MyChart app! Go to the app store, search "MyChart", open the app, select [facility], and log in with your MyChart username and password. ° °Due to Covid, a mask is required upon entering the hospital/clinic. If you do not have a mask, one will be given to you upon arrival. For doctor visits, patients may have 1 support person aged 18 or older with them. For treatment visits, patients cannot have anyone with them due to current Covid guidelines and our immunocompromised population.  ° °

## 2021-04-13 NOTE — Progress Notes (Signed)
Per Dr. Burr Medico, proceed today with Avastin/IV iron with hgb 6.8.

## 2021-04-14 ENCOUNTER — Telehealth: Payer: Self-pay

## 2021-04-14 ENCOUNTER — Inpatient Hospital Stay: Payer: Medicaid Other

## 2021-04-14 NOTE — Telephone Encounter (Signed)
Called Mrs. Isley-Wansley about her blood transfusion that she missed today.  Her Hgb is 6.8 so I stressed to her that she should come to the ED before Tuesday to get her blood transfusion. I let her know that we are not open on Monday due to the holiday. She understands and will go to the ED. Gardiner Rhyme, RN

## 2021-04-17 LAB — TYPE AND SCREEN
ABO/RH(D): A NEG
Antibody Screen: NEGATIVE
Unit division: 0
Unit division: 0

## 2021-04-17 LAB — BPAM RBC
Blood Product Expiration Date: 202301222359
Blood Product Expiration Date: 202301222359
Unit Type and Rh: 600
Unit Type and Rh: 600

## 2021-04-17 LAB — FERRITIN: Ferritin: 74 ng/mL (ref 11–307)

## 2021-04-19 ENCOUNTER — Inpatient Hospital Stay: Payer: Medicaid Other | Attending: Hematology

## 2021-04-19 ENCOUNTER — Other Ambulatory Visit: Payer: Medicaid Other

## 2021-04-19 ENCOUNTER — Inpatient Hospital Stay: Payer: Medicaid Other

## 2021-04-19 ENCOUNTER — Inpatient Hospital Stay: Payer: Medicaid Other | Admitting: Hematology

## 2021-04-19 ENCOUNTER — Telehealth: Payer: Self-pay | Admitting: *Deleted

## 2021-04-19 DIAGNOSIS — N3941 Urge incontinence: Secondary | ICD-10-CM | POA: Diagnosis not present

## 2021-04-19 DIAGNOSIS — K922 Gastrointestinal hemorrhage, unspecified: Secondary | ICD-10-CM | POA: Insufficient documentation

## 2021-04-19 DIAGNOSIS — D500 Iron deficiency anemia secondary to blood loss (chronic): Secondary | ICD-10-CM | POA: Insufficient documentation

## 2021-04-19 DIAGNOSIS — N319 Neuromuscular dysfunction of bladder, unspecified: Secondary | ICD-10-CM | POA: Diagnosis not present

## 2021-04-19 DIAGNOSIS — I780 Hereditary hemorrhagic telangiectasia: Secondary | ICD-10-CM | POA: Insufficient documentation

## 2021-04-19 NOTE — Telephone Encounter (Signed)
Called pt to f/u about today's appt. Pt stated that she thought her appt was 03/20/22. Pt states she can make it to 130pm appt. Dr.Feng agreed to see in infusion. Made charge nurse aware

## 2021-04-20 ENCOUNTER — Inpatient Hospital Stay: Payer: Medicaid Other

## 2021-04-20 ENCOUNTER — Other Ambulatory Visit: Payer: Self-pay

## 2021-04-20 ENCOUNTER — Telehealth: Payer: Self-pay

## 2021-04-20 VITALS — BP 121/60 | HR 71 | Resp 18 | Wt 235.2 lb

## 2021-04-20 DIAGNOSIS — Z95828 Presence of other vascular implants and grafts: Secondary | ICD-10-CM

## 2021-04-20 DIAGNOSIS — Z79899 Other long term (current) drug therapy: Secondary | ICD-10-CM | POA: Diagnosis present

## 2021-04-20 DIAGNOSIS — I780 Hereditary hemorrhagic telangiectasia: Secondary | ICD-10-CM | POA: Diagnosis present

## 2021-04-20 DIAGNOSIS — D500 Iron deficiency anemia secondary to blood loss (chronic): Secondary | ICD-10-CM | POA: Diagnosis present

## 2021-04-20 DIAGNOSIS — D649 Anemia, unspecified: Secondary | ICD-10-CM

## 2021-04-20 DIAGNOSIS — K922 Gastrointestinal hemorrhage, unspecified: Secondary | ICD-10-CM | POA: Diagnosis not present

## 2021-04-20 LAB — CBC WITH DIFFERENTIAL (CANCER CENTER ONLY)
Abs Immature Granulocytes: 0.04 10*3/uL (ref 0.00–0.07)
Basophils Absolute: 0 10*3/uL (ref 0.0–0.1)
Basophils Relative: 0 %
Eosinophils Absolute: 0.2 10*3/uL (ref 0.0–0.5)
Eosinophils Relative: 2 %
HCT: 22.2 % — ABNORMAL LOW (ref 36.0–46.0)
Hemoglobin: 6.4 g/dL — CL (ref 12.0–15.0)
Immature Granulocytes: 0 %
Lymphocytes Relative: 11 %
Lymphs Abs: 1.1 10*3/uL (ref 0.7–4.0)
MCH: 25.1 pg — ABNORMAL LOW (ref 26.0–34.0)
MCHC: 28.8 g/dL — ABNORMAL LOW (ref 30.0–36.0)
MCV: 87.1 fL (ref 80.0–100.0)
Monocytes Absolute: 0.5 10*3/uL (ref 0.1–1.0)
Monocytes Relative: 5 %
Neutro Abs: 8.2 10*3/uL — ABNORMAL HIGH (ref 1.7–7.7)
Neutrophils Relative %: 82 %
Platelet Count: 274 10*3/uL (ref 150–400)
RBC: 2.55 MIL/uL — ABNORMAL LOW (ref 3.87–5.11)
RDW: 21.5 % — ABNORMAL HIGH (ref 11.5–15.5)
WBC Count: 10 10*3/uL (ref 4.0–10.5)
nRBC: 0 % (ref 0.0–0.2)

## 2021-04-20 LAB — SAMPLE TO BLOOD BANK

## 2021-04-20 LAB — PREPARE RBC (CROSSMATCH)

## 2021-04-20 MED ORDER — HEPARIN SOD (PORK) LOCK FLUSH 100 UNIT/ML IV SOLN
500.0000 [IU] | Freq: Once | INTRAVENOUS | Status: AC
  Administered 2021-04-20: 500 [IU]

## 2021-04-20 MED ORDER — SODIUM CHLORIDE 0.9 % IV SOLN: Freq: Once | INTRAVENOUS | Status: AC

## 2021-04-20 MED ORDER — SODIUM CHLORIDE 0.9% FLUSH
10.0000 mL | Freq: Once | INTRAVENOUS | Status: AC
  Administered 2021-04-20: 10 mL

## 2021-04-20 MED ORDER — SODIUM CHLORIDE 0.9 % IV SOLN
250.0000 mg | Freq: Once | INTRAVENOUS | Status: AC
  Administered 2021-04-20: 250 mg via INTRAVENOUS
  Filled 2021-04-20: qty 20

## 2021-04-20 NOTE — Progress Notes (Signed)
2 units of PRBCs ordered.  T&S and prepare RBC orders released.  [place] in [place] confirmed orders for transfusion on 04/21/2021.

## 2021-04-20 NOTE — Patient Instructions (Signed)
Sodium Ferric Gluconate Complex Injection ?What is this medication? ?SODIUM FERRIC GLUCONATE COMPLEX (SOE dee um FER ik GLOO koe nate KOM pleks) treats low levels of iron (iron deficiency anemia) in people with kidney disease. Iron is a mineral that plays an important role in making red blood cells, which carry oxygen from your lungs to the rest of your body. ?This medicine may be used for other purposes; ask your health care provider or pharmacist if you have questions. ?COMMON BRAND NAME(S): Ferrlecit, Nulecit ?What should I tell my care team before I take this medication? ?They need to know if you have any of the following conditions: ?Anemia that is not from iron deficiency ?High levels of iron in the blood ?An unusual or allergic reaction to iron, other medications, foods, dyes, or preservatives ?Pregnant or are trying to become pregnant ?Breast-feeding ?How should I use this medication? ?This medication is injected into a vein. It is given by your care team in a hospital or clinic setting. ?Talk to your care team about the use of this medication in children. While it may be prescribed for children as young as 6 years for selected conditions, precautions do apply. ?Overdosage: If you think you have taken too much of this medicine contact a poison control center or emergency room at once. ?NOTE: This medicine is only for you. Do not share this medicine with others. ?What if I miss a dose? ?It is important not to miss your dose. Call your care team if you are unable to keep an appointment. ?What may interact with this medication? ?Do not take this medication with any of the following: ?Deferasirox ?Deferoxamine ?Dimercaprol ?This medication may also interact with the following: ?Other iron products ?This list may not describe all possible interactions. Give your health care provider a list of all the medicines, herbs, non-prescription drugs, or dietary supplements you use. Also tell them if you smoke, drink  alcohol, or use illegal drugs. Some items may interact with your medicine. ?What should I watch for while using this medication? ?Your condition will be monitored carefully while you are receiving this medication. ?Visit your care team for regular checks on your progress. You may need blood work while you are taking this medication. ?What side effects may I notice from receiving this medication? ?Side effects that you should report to your care team as soon as possible: ?Allergic reactions--skin rash, itching, hives, swelling of the face, lips, tongue, or throat ?Low blood pressure--dizziness, feeling faint or lightheaded, blurry vision ?Shortness of breath ?Side effects that usually do not require medical attention (report to your care team if they continue or are bothersome): ?Flushing ?Headache ?Joint pain ?Muscle pain ?Nausea ?Pain, redness, or irritation at injection site ?This list may not describe all possible side effects. Call your doctor for medical advice about side effects. You may report side effects to FDA at 1-800-FDA-1088. ?Where should I keep my medication? ?This medication is given in a hospital or clinic and will not be stored at home. ?NOTE: This sheet is a summary. It may not cover all possible information. If you have questions about this medicine, talk to your doctor, pharmacist, or health care provider. ?? 2022 Elsevier/Gold Standard (2020-08-25 00:00:00) ? ?

## 2021-04-20 NOTE — Telephone Encounter (Signed)
CRITICAL VALUE STICKER  CRITICAL VALUE: Hgb = 6.4  RECEIVER (on-site recipient of call): Yetta Glassman, CMA  DATE & TIME NOTIFIED: 04/20/21 at 12:20pm  MESSENGER (representative from lab): Lelan Pons  MD NOTIFIED: Burr Medico  TIME OF NOTIFICATION: 04/20/21 at 12:30pm  RESPONSE: Notification given to Tammi Sou, RN for follow-up with pt and provider.

## 2021-04-21 ENCOUNTER — Inpatient Hospital Stay: Payer: Medicaid Other

## 2021-04-21 ENCOUNTER — Other Ambulatory Visit: Payer: Self-pay | Admitting: Hematology

## 2021-04-21 ENCOUNTER — Other Ambulatory Visit (HOSPITAL_COMMUNITY): Payer: Self-pay

## 2021-04-21 ENCOUNTER — Encounter: Payer: Self-pay | Admitting: Hematology

## 2021-04-21 ENCOUNTER — Other Ambulatory Visit: Payer: Self-pay

## 2021-04-21 DIAGNOSIS — D500 Iron deficiency anemia secondary to blood loss (chronic): Secondary | ICD-10-CM

## 2021-04-21 DIAGNOSIS — D649 Anemia, unspecified: Secondary | ICD-10-CM

## 2021-04-21 DIAGNOSIS — R040 Epistaxis: Secondary | ICD-10-CM

## 2021-04-21 MED ORDER — ACETAMINOPHEN 325 MG PO TABS
650.0000 mg | ORAL_TABLET | Freq: Once | ORAL | Status: AC
  Administered 2021-04-21: 650 mg via ORAL

## 2021-04-21 MED ORDER — HEPARIN SOD (PORK) LOCK FLUSH 100 UNIT/ML IV SOLN
500.0000 [IU] | Freq: Every day | INTRAVENOUS | Status: AC | PRN
  Administered 2021-04-21: 500 [IU]

## 2021-04-21 MED ORDER — AMINOCAPROIC ACID 1000 MG PO TABS
1000.0000 mg | ORAL_TABLET | Freq: Two times a day (BID) | ORAL | 2 refills | Status: DC
  Filled 2021-04-21 – 2021-05-03 (×2): qty 60, 30d supply, fill #0

## 2021-04-21 MED ORDER — SODIUM CHLORIDE 0.9% FLUSH
10.0000 mL | INTRAVENOUS | Status: AC | PRN
  Administered 2021-04-21: 10 mL

## 2021-04-21 MED ORDER — DIPHENHYDRAMINE HCL 25 MG PO CAPS
ORAL_CAPSULE | ORAL | Status: AC
  Filled 2021-04-21: qty 1

## 2021-04-21 MED ORDER — SODIUM CHLORIDE 0.9% IV SOLUTION
250.0000 mL | Freq: Once | INTRAVENOUS | Status: AC
  Administered 2021-04-21: 250 mL via INTRAVENOUS

## 2021-04-21 MED ORDER — DIPHENHYDRAMINE HCL 25 MG PO CAPS
25.0000 mg | ORAL_CAPSULE | Freq: Once | ORAL | Status: AC
  Administered 2021-04-21: 25 mg via ORAL

## 2021-04-21 NOTE — Progress Notes (Signed)
Patient arrived at 53 today and stated she only wanted one unit of blood today. I told her that even though she was late, her blood is ready and we can do both units but she said "NO, that would cause me to have a bad nosebleed today and I only want one unit". She was told about her lab results but did not change her mind. BB was notified only one unit was going to be given today.   Dr. Burr Medico was made aware, she was in clinic and came over to assess/talk to patient.

## 2021-04-21 NOTE — Patient Instructions (Signed)
Blood Transfusion, Adult, Care After This sheet gives you information about how to care for yourself after your procedure. Your health care provider may also give you more specific instructions. If you have problems or questions, contact your health care provider. What can I expect after the procedure? After the procedure, it is common to have: Bruising and soreness where the IV was inserted. A headache. Follow these instructions at home: IV insertion site care   Follow instructions from your health care provider about how to take care of your IV insertion site. Make sure you: Wash your hands with soap and water before and after you change your bandage (dressing). If soap and water are not available, use hand sanitizer. Change your dressing as told by your health care provider. Check your IV insertion site every day for signs of infection. Check for: Redness, swelling, or pain. Bleeding from the site. Warmth. Pus or a bad smell. General instructions Take over-the-counter and prescription medicines only as told by your health care provider. Rest as told by your health care provider. Return to your normal activities as told by your health care provider. Keep all follow-up visits as told by your health care provider. This is important. Contact a health care provider if: You have itching or red, swollen areas of skin (hives). You feel anxious. You feel weak after doing your normal activities. You have redness, swelling, warmth, or pain around the IV insertion site. You have blood coming from the IV insertion site that does not stop with pressure. You have pus or a bad smell coming from your IV insertion site. Get help right away if: You have symptoms of a serious allergic or immune system reaction, including: Trouble breathing or shortness of breath. Swelling of the face or feeling flushed. Fever or chills. Pain in the head, back, or chest. Dark urine or blood in the urine. Widespread  rash. Fast heartbeat. Feeling dizzy or light-headed. If you receive your blood transfusion in an outpatient setting, you will be told whom to contact to report any reactions. These symptoms may represent a serious problem that is an emergency. Do not wait to see if the symptoms will go away. Get medical help right away. Call your local emergency services (911 in the [place] Do not drive yourself to the hospital. Summary Bruising and tenderness around the IV insertion site are common. Check your IV insertion site every day for signs of infection. Rest as told by your health care provider. Return to your normal activities as told by your health care provider. Get help right away for symptoms of a serious allergic or immune system reaction to blood transfusion. This information is not intended to replace advice given to you by your health care provider. Make sure you discuss any questions you have with your health care provider. Document Revised: 07/27/2020 Document Reviewed: 09/24/2018 Elsevier Patient Education  2022 Elsevier Inc.  

## 2021-04-24 ENCOUNTER — Ambulatory Visit: Payer: Medicaid Other | Admitting: Behavioral Health

## 2021-04-24 ENCOUNTER — Other Ambulatory Visit (HOSPITAL_COMMUNITY): Payer: Self-pay

## 2021-04-24 DIAGNOSIS — F419 Anxiety disorder, unspecified: Secondary | ICD-10-CM

## 2021-04-24 DIAGNOSIS — F331 Major depressive disorder, recurrent, moderate: Secondary | ICD-10-CM

## 2021-04-24 LAB — BPAM RBC
Blood Product Expiration Date: 202301312359
Blood Product Expiration Date: 202301312359
ISSUE DATE / TIME: 202301071013
Unit Type and Rh: 600
Unit Type and Rh: 600

## 2021-04-24 LAB — TYPE AND SCREEN
ABO/RH(D): A NEG
Antibody Screen: NEGATIVE
Unit division: 0
Unit division: 0

## 2021-04-24 NOTE — BH Specialist Note (Signed)
Integrated Behavioral Health via Telemedicine Visit  04/24/2021 COURTNI BALASH [id number]  Number of Integrated Behavioral Health visits: 10 Session Start time: 1:30pm  Session End time: 2:00pm Total time: 30  Referring Provider: Dr. [name], MD Patient/Family location: Pt is in new home w/her Son Ricky-she has privacy [facility] Provider location: [facility] Office All persons participating in visit: Pt & Clinician Types of Service: Individual psychotherapy  I connected with [name] and/or [name]  self  via  Telephone or Video Enabled Telemedicine Application  (Video is Caregility application) and verified that I am speaking with the correct person using two identifiers. Discussed confidentiality:  no-10th visit  I discussed the limitations of telemedicine and the availability of in person appointments.  Discussed there is a possibility of technology failure and discussed alternative modes of communication if that failure occurs.  I discussed that engaging in this telemedicine visit, they consent to the provision of behavioral healthcare and the services will be billed under their insurance.  Patient and/or legal guardian expressed understanding and consented to Telemedicine visit:  11th visit  Presenting Concerns: Patient and/or family reports the following symptoms/concerns: less concerned for fatigue since transfusion over the wknd, her Fe injection & Chemo Tx last Friday Duration of problem: over one year; Severity of problem: moderate  Patient and/or Family's Strengths/Protective Factors: Social and Emotional competence, Concrete supports in place (healthy food, safe environments, etc.), and Sense of purpose  Goals Addressed: Patient will:  Reduce symptoms of: anxiety and depression   Increase knowledge and/or ability of: coping skills and healthy habits   Demonstrate ability to: Increase healthy adjustment to current life  circumstances  Progress towards Goals: Ongoing  Interventions: Interventions utilized:  Supportive Counseling Standardized Assessments completed:  screeners prn  Patient and/or Family Response: Pt receptive to call today & requests future appts  Assessment: Patient currently experiencing elevated anx/dep due to her lack of focus & trying to keep her spirits up. Pt needs new eye glasses to enable reading.  Patient may benefit from cont'd ck-ins for mental health wellness.  Plan: Follow up with behavioral health clinician on : 2-3 wks on telehealth for 30 min Behavioral recommendations: Get new eye glasses & some reading materials to keep you occupied. Referral(s): [place] (In Clinic)  I discussed the assessment and treatment plan with the patient and/or parent/guardian. They were provided an opportunity to ask questions and all were answered. They agreed with the plan and demonstrated an understanding of the instructions.   They were advised to call back or seek an in-person evaluation if the symptoms worsen or if the condition fails to improve as anticipated.  [name], LMFT

## 2021-04-25 ENCOUNTER — Other Ambulatory Visit: Payer: Self-pay

## 2021-04-25 DIAGNOSIS — D500 Iron deficiency anemia secondary to blood loss (chronic): Secondary | ICD-10-CM

## 2021-04-26 ENCOUNTER — Other Ambulatory Visit: Payer: Medicaid Other

## 2021-04-26 ENCOUNTER — Inpatient Hospital Stay: Payer: Medicaid Other

## 2021-04-27 ENCOUNTER — Other Ambulatory Visit: Payer: Self-pay | Admitting: Hematology

## 2021-05-01 ENCOUNTER — Other Ambulatory Visit: Payer: Self-pay

## 2021-05-01 DIAGNOSIS — I10 Essential (primary) hypertension: Secondary | ICD-10-CM

## 2021-05-01 MED ORDER — HYDROCHLOROTHIAZIDE 12.5 MG PO TABS: ORAL_TABLET | ORAL | 1 refills | Status: DC

## 2021-05-02 ENCOUNTER — Other Ambulatory Visit (HOSPITAL_COMMUNITY): Payer: Self-pay

## 2021-05-03 ENCOUNTER — Inpatient Hospital Stay: Payer: Medicaid Other

## 2021-05-03 ENCOUNTER — Other Ambulatory Visit (HOSPITAL_COMMUNITY): Payer: Self-pay

## 2021-05-03 ENCOUNTER — Other Ambulatory Visit: Payer: Self-pay

## 2021-05-03 VITALS — BP 134/71 | HR 66 | Temp 98.7°F | Resp 17 | Wt 234.0 lb

## 2021-05-03 DIAGNOSIS — I780 Hereditary hemorrhagic telangiectasia: Secondary | ICD-10-CM

## 2021-05-03 DIAGNOSIS — D649 Anemia, unspecified: Secondary | ICD-10-CM

## 2021-05-03 DIAGNOSIS — D500 Iron deficiency anemia secondary to blood loss (chronic): Secondary | ICD-10-CM | POA: Diagnosis not present

## 2021-05-03 DIAGNOSIS — Z95828 Presence of other vascular implants and grafts: Secondary | ICD-10-CM

## 2021-05-03 DIAGNOSIS — Q2733 Arteriovenous malformation of digestive system vessel: Secondary | ICD-10-CM

## 2021-05-03 LAB — CMP (CANCER CENTER ONLY)
ALT: 6 U/L (ref 0–44)
AST: 12 U/L — ABNORMAL LOW (ref 15–41)
Albumin: 3.8 g/dL (ref 3.5–5.0)
Alkaline Phosphatase: 59 U/L (ref 38–126)
Anion gap: 10 (ref 5–15)
BUN: 12 mg/dL (ref 6–20)
CO2: 24 mmol/L (ref 22–32)
Calcium: 9.4 mg/dL (ref 8.9–10.3)
Chloride: 106 mmol/L (ref 98–111)
Creatinine: 0.92 mg/dL (ref 0.44–1.00)
GFR, Estimated: >60 mL/min (ref >60)
Glucose, Bld: 125 mg/dL — ABNORMAL HIGH (ref 70–99)
Potassium: 3.2 mmol/L — ABNORMAL LOW (ref 3.5–5.1)
Sodium: 140 mmol/L (ref 135–145)
Total Bilirubin: 0.2 mg/dL — ABNORMAL LOW (ref 0.3–1.2)
Total Protein: 7 g/dL (ref 6.5–8.1)

## 2021-05-03 LAB — CBC WITH DIFFERENTIAL (CANCER CENTER ONLY)
Abs Immature Granulocytes: 0.04 10*3/uL (ref 0.00–0.07)
Basophils Absolute: 0 10*3/uL (ref 0.0–0.1)
Basophils Relative: 0 %
Eosinophils Absolute: 0.1 10*3/uL (ref 0.0–0.5)
Eosinophils Relative: 1 %
HCT: 23.2 % — ABNORMAL LOW (ref 36.0–46.0)
Hemoglobin: 6.9 g/dL — CL (ref 12.0–15.0)
Immature Granulocytes: 0 %
Lymphocytes Relative: 12 %
Lymphs Abs: 1.5 10*3/uL (ref 0.7–4.0)
MCH: 25.7 pg — ABNORMAL LOW (ref 26.0–34.0)
MCHC: 29.7 g/dL — ABNORMAL LOW (ref 30.0–36.0)
MCV: 86.6 fL (ref 80.0–100.0)
Monocytes Absolute: 0.6 10*3/uL (ref 0.1–1.0)
Monocytes Relative: 5 %
Neutro Abs: 10.6 10*3/uL — ABNORMAL HIGH (ref 1.7–7.7)
Neutrophils Relative %: 82 %
Platelet Count: 285 10*3/uL (ref 150–400)
RBC: 2.68 MIL/uL — ABNORMAL LOW (ref 3.87–5.11)
RDW: 21.5 % — ABNORMAL HIGH (ref 11.5–15.5)
WBC Count: 12.9 10*3/uL — ABNORMAL HIGH (ref 4.0–10.5)
nRBC: 0 % (ref 0.0–0.2)

## 2021-05-03 LAB — SAMPLE TO BLOOD BANK

## 2021-05-03 LAB — PREPARE RBC (CROSSMATCH)

## 2021-05-03 LAB — FERRITIN: Ferritin: 77 ng/mL (ref 11–307)

## 2021-05-03 MED ORDER — SODIUM CHLORIDE 0.9 % IV SOLN: Freq: Once | INTRAVENOUS | Status: AC

## 2021-05-03 MED ORDER — SODIUM CHLORIDE 0.9 % IV SOLN
5.0000 mg/kg | Freq: Once | INTRAVENOUS | Status: AC
  Administered 2021-05-03: 500 mg via INTRAVENOUS
  Filled 2021-05-03: qty 4

## 2021-05-03 MED ORDER — DIPHENHYDRAMINE HCL 25 MG PO CAPS
25.0000 mg | ORAL_CAPSULE | Freq: Once | ORAL | Status: AC
  Administered 2021-05-03: 25 mg via ORAL
  Filled 2021-05-03: qty 1

## 2021-05-03 MED ORDER — SODIUM CHLORIDE 0.9% IV SOLUTION: 250.0000 mL | Freq: Once | INTRAVENOUS | Status: DC

## 2021-05-03 MED ORDER — SODIUM CHLORIDE 0.9% FLUSH
10.0000 mL | Freq: Once | INTRAVENOUS | Status: AC
  Administered 2021-05-03: 10 mL

## 2021-05-03 MED ORDER — ACETAMINOPHEN 325 MG PO TABS
650.0000 mg | ORAL_TABLET | Freq: Once | ORAL | Status: AC
  Administered 2021-05-03: 650 mg via ORAL
  Filled 2021-05-03: qty 2

## 2021-05-03 NOTE — Progress Notes (Signed)
Per Dr. Burr Medico, "OK To Treat w/o protein urine today".

## 2021-05-03 NOTE — Progress Notes (Signed)
Spoke with Beth in [facility] to confirm prepare and T&S orders are visible.

## 2021-05-03 NOTE — Patient Instructions (Addendum)
Blood Transfusion, Adult, Care After This sheet gives you information about how to care for yourself after your procedure. Your health care provider may also give you more specific instructions. If you have problems or questions, contact your health care provider. What can I expect after the procedure? After the procedure, it is common to have: Bruising and soreness where the IV was inserted. A headache. Follow these instructions at home: IV insertion site care   Follow instructions from your health care provider about how to take care of your IV insertion site. Make sure you: Wash your hands with soap and water before and after you change your bandage (dressing). If soap and water are not available, use hand sanitizer. Change your dressing as told by your health care provider. Check your IV insertion site every day for signs of infection. Check for: Redness, swelling, or pain. Bleeding from the site. Warmth. Pus or a bad smell. General instructions Take over-the-counter and prescription medicines only as told by your health care provider. Rest as told by your health care provider. Return to your normal activities as told by your health care provider. Keep all follow-up visits as told by your health care provider. This is important. Contact a health care provider if: You have itching or red, swollen areas of skin (hives). You feel anxious. You feel weak after doing your normal activities. You have redness, swelling, warmth, or pain around the IV insertion site. You have blood coming from the IV insertion site that does not stop with pressure. You have pus or a bad smell coming from your IV insertion site. Get help right away if: You have symptoms of a serious allergic or immune system reaction, including: Trouble breathing or shortness of breath. Swelling of the face or feeling flushed. Fever or chills. Pain in the head, back, or chest. Dark urine or blood in the urine. Widespread  rash. Fast heartbeat. Feeling dizzy or light-headed. If you receive your blood transfusion in an outpatient setting, you will be told whom to contact to report any reactions. These symptoms may represent a serious problem that is an emergency. Do not wait to see if the symptoms will go away. Get medical help right away. Call your local emergency services (911 in the [place] Do not drive yourself to the hospital. Summary Bruising and tenderness around the IV insertion site are common. Check your IV insertion site every day for signs of infection. Rest as told by your health care provider. Return to your normal activities as told by your health care provider. Get help right away for symptoms of a serious allergic or immune system reaction to blood transfusion. This information is not intended to replace advice given to you by your health care provider. Make sure you discuss any questions you have with your health care provider. Document Revised: 07/27/2020 Document Reviewed: 09/24/2018 Elsevier Patient Education  2022 [employer]  

## 2021-05-04 ENCOUNTER — Other Ambulatory Visit: Payer: Self-pay | Admitting: *Deleted

## 2021-05-04 DIAGNOSIS — D500 Iron deficiency anemia secondary to blood loss (chronic): Secondary | ICD-10-CM

## 2021-05-05 ENCOUNTER — Inpatient Hospital Stay: Payer: Medicaid Other

## 2021-05-05 ENCOUNTER — Other Ambulatory Visit: Payer: Self-pay

## 2021-05-05 DIAGNOSIS — D500 Iron deficiency anemia secondary to blood loss (chronic): Secondary | ICD-10-CM

## 2021-05-05 MED ORDER — HEPARIN SOD (PORK) LOCK FLUSH 100 UNIT/ML IV SOLN
500.0000 [IU] | Freq: Every day | INTRAVENOUS | Status: AC | PRN
  Administered 2021-05-05: 500 [IU]

## 2021-05-05 MED ORDER — ACETAMINOPHEN 325 MG PO TABS
650.0000 mg | ORAL_TABLET | Freq: Once | ORAL | Status: AC
  Administered 2021-05-05: 650 mg via ORAL

## 2021-05-05 MED ORDER — SODIUM CHLORIDE 0.9% FLUSH
10.0000 mL | INTRAVENOUS | Status: AC | PRN
  Administered 2021-05-05: 10 mL

## 2021-05-05 MED ORDER — DIPHENHYDRAMINE HCL 25 MG PO CAPS
25.0000 mg | ORAL_CAPSULE | Freq: Once | ORAL | Status: AC
  Administered 2021-05-05: 25 mg via ORAL

## 2021-05-05 NOTE — Patient Instructions (Signed)
Blood Transfusion, Adult, Care After This sheet gives you information about how to care for yourself after your procedure. Your health care provider may also give you more specific instructions. If you have problems or questions, contact your health care provider. What can I expect after the procedure? After the procedure, it is common to have: Bruising and soreness where the IV was inserted. A headache. Follow these instructions at home: IV insertion site care   Follow instructions from your health care provider about how to take care of your IV insertion site. Make sure you: Wash your hands with soap and water before and after you change your bandage (dressing). If soap and water are not available, use hand sanitizer. Change your dressing as told by your health care provider. Check your IV insertion site every day for signs of infection. Check for: Redness, swelling, or pain. Bleeding from the site. Warmth. Pus or a bad smell. General instructions Take over-the-counter and prescription medicines only as told by your health care provider. Rest as told by your health care provider. Return to your normal activities as told by your health care provider. Keep all follow-up visits as told by your health care provider. This is important. Contact a health care provider if: You have itching or red, swollen areas of skin (hives). You feel anxious. You feel weak after doing your normal activities. You have redness, swelling, warmth, or pain around the IV insertion site. You have blood coming from the IV insertion site that does not stop with pressure. You have pus or a bad smell coming from your IV insertion site. Get help right away if: You have symptoms of a serious allergic or immune system reaction, including: Trouble breathing or shortness of breath. Swelling of the face or feeling flushed. Fever or chills. Pain in the head, back, or chest. Dark urine or blood in the urine. Widespread  rash. Fast heartbeat. Feeling dizzy or light-headed. If you receive your blood transfusion in an outpatient setting, you will be told whom to contact to report any reactions. These symptoms may represent a serious problem that is an emergency. Do not wait to see if the symptoms will go away. Get medical help right away. Call your local emergency services (911 in the [place] Do not drive yourself to the hospital. Summary Bruising and tenderness around the IV insertion site are common. Check your IV insertion site every day for signs of infection. Rest as told by your health care provider. Return to your normal activities as told by your health care provider. Get help right away for symptoms of a serious allergic or immune system reaction to blood transfusion. This information is not intended to replace advice given to you by your health care provider. Make sure you discuss any questions you have with your health care provider. Document Revised: 07/27/2020 Document Reviewed: 09/24/2018 Elsevier Patient Education  2022 Elsevier Inc.  

## 2021-05-06 ENCOUNTER — Other Ambulatory Visit: Payer: Self-pay | Admitting: Hematology

## 2021-05-07 LAB — BPAM RBC
Blood Product Expiration Date: 202302022359
Blood Product Expiration Date: 202302022359
ISSUE DATE / TIME: 202301191503
ISSUE DATE / TIME: 202301210947
Unit Type and Rh: 600
Unit Type and Rh: 600

## 2021-05-07 LAB — TYPE AND SCREEN
ABO/RH(D): A NEG
Antibody Screen: NEGATIVE
Unit division: 0
Unit division: 0

## 2021-05-10 ENCOUNTER — Inpatient Hospital Stay: Payer: Medicaid Other

## 2021-05-10 ENCOUNTER — Inpatient Hospital Stay: Payer: Medicaid Other | Admitting: Hematology

## 2021-05-10 ENCOUNTER — Encounter: Payer: Self-pay | Admitting: Hematology

## 2021-05-10 ENCOUNTER — Other Ambulatory Visit: Payer: Self-pay

## 2021-05-10 ENCOUNTER — Telehealth: Payer: Self-pay | Admitting: Hematology

## 2021-05-10 VITALS — BP 147/68 | HR 65 | Temp 98.8°F | Resp 16 | Ht 65.5 in | Wt 238.4 lb

## 2021-05-10 VITALS — BP 144/66 | HR 64

## 2021-05-10 DIAGNOSIS — D500 Iron deficiency anemia secondary to blood loss (chronic): Secondary | ICD-10-CM | POA: Diagnosis not present

## 2021-05-10 DIAGNOSIS — Z95828 Presence of other vascular implants and grafts: Secondary | ICD-10-CM

## 2021-05-10 DIAGNOSIS — D649 Anemia, unspecified: Secondary | ICD-10-CM

## 2021-05-10 DIAGNOSIS — F331 Major depressive disorder, recurrent, moderate: Secondary | ICD-10-CM

## 2021-05-10 DIAGNOSIS — I780 Hereditary hemorrhagic telangiectasia: Secondary | ICD-10-CM

## 2021-05-10 LAB — CBC WITH DIFFERENTIAL (CANCER CENTER ONLY)
Abs Immature Granulocytes: 0.04 10*3/uL (ref 0.00–0.07)
Basophils Absolute: 0.1 10*3/uL (ref 0.0–0.1)
Basophils Relative: 1 %
Eosinophils Absolute: 0.2 10*3/uL (ref 0.0–0.5)
Eosinophils Relative: 1 %
HCT: 29.7 % — ABNORMAL LOW (ref 36.0–46.0)
Hemoglobin: 8.8 g/dL — ABNORMAL LOW (ref 12.0–15.0)
Immature Granulocytes: 0 %
Lymphocytes Relative: 13 %
Lymphs Abs: 1.4 10*3/uL (ref 0.7–4.0)
MCH: 25.4 pg — ABNORMAL LOW (ref 26.0–34.0)
MCHC: 29.6 g/dL — ABNORMAL LOW (ref 30.0–36.0)
MCV: 85.6 fL (ref 80.0–100.0)
Monocytes Absolute: 0.5 10*3/uL (ref 0.1–1.0)
Monocytes Relative: 5 %
Neutro Abs: 8.5 10*3/uL — ABNORMAL HIGH (ref 1.7–7.7)
Neutrophils Relative %: 80 %
Platelet Count: 345 10*3/uL (ref 150–400)
RBC: 3.47 MIL/uL — ABNORMAL LOW (ref 3.87–5.11)
RDW: 19.9 % — ABNORMAL HIGH (ref 11.5–15.5)
WBC Count: 10.7 10*3/uL — ABNORMAL HIGH (ref 4.0–10.5)
nRBC: 0 % (ref 0.0–0.2)

## 2021-05-10 LAB — SAMPLE TO BLOOD BANK

## 2021-05-10 MED ORDER — TRAZODONE HCL 100 MG PO TABS: ORAL_TABLET | ORAL | 2 refills | Status: DC

## 2021-05-10 MED ORDER — SODIUM CHLORIDE 0.9% FLUSH
10.0000 mL | Freq: Once | INTRAVENOUS | Status: AC
  Administered 2021-05-10: 10 mL

## 2021-05-10 MED ORDER — SODIUM CHLORIDE 0.9 % IV SOLN
250.0000 mg | Freq: Once | INTRAVENOUS | Status: AC
  Administered 2021-05-10: 250 mg via INTRAVENOUS
  Filled 2021-05-10: qty 20

## 2021-05-10 MED ORDER — HEPARIN SOD (PORK) LOCK FLUSH 100 UNIT/ML IV SOLN
500.0000 [IU] | Freq: Once | INTRAVENOUS | Status: AC
  Administered 2021-05-10: 500 [IU]

## 2021-05-10 NOTE — Progress Notes (Addendum)
[place]   [phone] [phone]   Clinic Follow up Note   Patient Care Team: [name], MD as PCP - General  Date of Service:  05/10/2021  CHIEF COMPLAINT: f/u of HHT and anemia  CURRENT THERAPY:  -Blood transfusion for severe anemia with Hg<8.0 (2units on different days if Hg<7) secondary to GI bleeding as needed  -iv ferric gluconate every week with ferritin goal 100-200.  -Restarted Avastin/Zirabev q2weeks on 04/03/18. Given PRN due to proteinuria. -Amicar 1g bid starting 07/17/2018. Reduced to only as needed on 12/10/18 due to DVT. Restart daily on 06/16/20 due to increased bleeding. Increased to BID, then TID on 07/07/20.  ASSESSMENT & PLAN:  Peggy House is a 61 y.o. 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 -Pt was hospitalized several times from 1-10/2017 for severe recurrent GI bleeding from AVMs which required multiple blood transfusions and APCs. 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  -She is s/p IR embolization of the left Gastric and short gastric artery branch vessels without evidence of residual flow in the Dieulafoy lesion on 12/10/17.  -She required cauterization of stomach for severe Gi bleeding in 11/2018, and epinephrine injection for bleeding ulcer on 11/03/20 -she continues on Amicar TID. -She has been on bevacizumab every 2-4 week, treatment has been intermittently held due to large proteinuria and missed appointments.  -she reports continued epistaxis about once every few weeks. -labs reviewed, she does not need blood transfusion this week.    2. 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  -She was admitted on 11/01/20 for symptomatic anemia following a syncopal episode, as well as hematochezia. -Hgb 8.8 today (05/10/21) -Follow-up in 4 weeks   3. B/l LE Edema -Stable. Continue compression socks and leg elevation  -[place] of leg found lymphadenopathy of b/l groin. She does have skin darkening of her legs which she has had in recent months.    4. Smoking cessation -She was smoking 10 cigarettes a day on average before recent hospitalization. She is smoking about 1/2 ppd (as of 08/03/20) -I strongly encouraged her to stop smoking completely. She tried previously but had trouble.   5. HTN, Recently diagnosed DM, Anxiety/Depression -Continue medications. Will monitor on Avastin.   -Continue to f/u with PCP -I again advised her to speak with her PCP.      PLAN:  -proceed with IV iron today -Lab and iv ferric gluconate 250mg  weekly x5 -Beva every 2 weeks -2 hrs blood transfusion every 2 weeks on Saturday starting 2/4 for next 8 weeks  -F/u in 6 weeks  -I again reinforced that importance of compliance due to her frequent missing appointments and being late for appointments, I told her to call us if she miss her appointment otherwise we will not reschedule. I gave her the phone number for her to call to set up her [facility] sponsored transportation.     Treatment parameter:  -Ferric Gluconate 250mg  unless Ferritin >200 on last lab  -beva if urine protein <=100 -blood  transfusion if hg<8.0, 2u on different days if Hg<7.0   No problem-specific Assessment & Plan notes found for this encounter.   INTERVAL HISTORY:  Peggy House is here for a follow up of HHT and anemia. She was last seen by me on 03/23/21. She presents to the clinic alone. She reports her most recent nosebleed was on Tuesday that wasn't  too bad. She notes they continue once every few weeks.   All other systems were reviewed with the patient and are negative.  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    SURGICAL HISTORY: Past Surgical History:  Procedure Laterality Date   ABDOMINAL HYSTERECTOMY     CARPAL TUNNEL RELEASE  05/13/2011   Procedure: CARPAL TUNNEL RELEASE;  Surgeon: Nita Sells, MD;  Location: [place];  Service: Orthopedics;  Laterality: Left;   COLONOSCOPY N/A 03/02/2020   Procedure: COLONOSCOPY;  Surgeon: Ronald Lobo, MD;  Location: [facility] ENDOSCOPY;  Service: Endoscopy;  Laterality: N/A;   COLONOSCOPY WITH PROPOFOL N/A 04/28/2014   Procedure: COLONOSCOPY WITH PROPOFOL;  Surgeon: Cleotis Nipper, MD;  Location: [facility] ENDOSCOPY;  Service: Endoscopy;  Laterality: N/A;   DG TOES*L*  2/10   rt   DILATION AND CURETTAGE OF UTERUS     ENTEROSCOPY N/A 10/17/2017   Procedure: ENTEROSCOPY;  Surgeon: Otis Brace, MD;  Location: [place];  Service: Gastroenterology;  Laterality: N/A;   ESOPHAGOGASTRODUODENOSCOPY N/A 04/10/2014   Procedure: ESOPHAGOGASTRODUODENOSCOPY (EGD);  Surgeon: [name], MD;  Location: [facility] ENDOSCOPY;  Service: Endoscopy;  Laterality: N/A;   ESOPHAGOGASTRODUODENOSCOPY N/A 05/10/2017   Procedure: ESOPHAGOGASTRODUODENOSCOPY (EGD);  Surgeon: Ronald Lobo, MD;  Location[facility] END[facility]COPY;  Service: Endoscopy;  Laterality: N/A;   ESOPHAGOGASTRODUODENOSCOPY N/A 09/22/2017   Procedure: ESOPHAGOGASTRODUODENOSCOPY (EGD);  Surgeon: Clarene Essex, MD;  Location: [place];  Service: Endoscopy;  Laterality:  N/A;  bedside   ESOPHAGOGASTRODUODENOSCOPY N/A 03/02/2020   Procedure: ESOPHAGOGASTRODUODENOSCOPY (EGD);  Surgeon: Ronald Lobo, MD;  Location: [facility];  Service: Endoscopy;  Laterality: N/A;   ESOPHAGOGASTRODUODENOSCOPY N/A 11/03/2020   Procedure: ESOPHAGOGASTRODUODENOSCOPY (EGD);  Surgeon: Wilford Corner, MD;  Location: [place];  Service: Endoscopy;  Laterality: N/A;   ESOPHAGOGASTRODUODENOSCOPY (EGD) WITH PROPOFOL N/A 04/27/2014   Procedure: ESOPHAGOGASTRODUODENOSCOPY (EGD) WITH PROPOFOL;  Surgeon: Cleotis Nipper, MD;  Location: [place];  Service: Endoscopy;  Laterality: N/A;  possible apc   ESOPHAGOGASTRODUODENOSCOPY (EGD) WITH PROPOFOL N/A 09/30/2017   Procedure: ESOPHAGOGASTRODUODENOSCOPY (EGD) WITH PROPOFOL;  Surgeon: [name], MD;  Location: [place];  Service: Gastroenterology;  Laterality: N/A;   ESOPHAGOGASTRODUODENOSCOPY (EGD) WITH PROPOFOL N/A 10/01/2017   Procedure: ESOPHAGOGASTRODUODENOSCOPY (EGD) WITH PROPOFOL;  Surgeon: [name], MD;  Location: [place];  Service: Gastroenterology;  Laterality: N/A;   ESOPHAGOGASTRODUODENOSCOPY (EGD) WITH PROPOFOL N/A 10/08/2017   Procedure: ESOPHAGOGASTRODUODENOSCOPY (EGD) WITH PROPOFOL;  Surgeon: Otis Brace, MD;  Location: [place];  Service: Gastroenterology;  Laterality: N/A;   ESOPHAGOGASTRODUODENOSCOPY (EGD) WITH PROPOFOL N/A 10/17/2017   Procedure: ESOPHAGOGASTRODUODENOSCOPY (EGD) WITH PROPOFOL;  Surgeon: Otis Brace, MD;  Location: [place];  Service: Gastroenterology;  Laterality: N/A;   ESOPHAGOGASTRODUODENOSCOPY (EGD) WITH PROPOFOL  N/A 10/19/2017   Procedure: ESOPHAGOGASTRODUODENOSCOPY (EGD) WITH PROPOFOL;  Surgeon: Otis Brace, MD;  Location: [facility] ENDOSCOPY;  Service: Gastroenterology;  Laterality: N/A;   ESOPHAGOGASTRODUODENOSCOPY (EGD) WITH PROPOFOL N/A 12/04/2018   Procedure: ESOPHAGOGASTRODUODENOSCOPY (EGD) WITH PROPOFOL;  Surgeon: Wilford Corner, MD;  Location: [facility]OS[facility];   Service: Endoscopy;  Laterality: N/A;   GIVENS CAPSULE STUDY N/A 10/02/2017   Procedure: GIVENS CAPSULE STUDY;  Surgeon: [name], MD;  Location: [place];  Service: Gastroenterology;  Laterality: N/A;   GIVENS CAPSULE STUDY N/A 10/08/2017   Procedure: GIVENS CAPSULE STUDY;  Surgeon: Otis Brace, MD;  Location: [place];  Service: Gastroenterology;  Laterality: N/A;  endoscopic placement of capsule   GIVENS CAPSULE STUDY N/A 03/02/2020   Procedure: GIVENS CAPSULE STUDY;  Surgeon: Ronald Lobo, MD;  [facility];  Service: Endoscopy;  Laterality: N/A;   HEMOSTASIS CLIP PLACEMENT  12/04/2018   Procedure: HEMOSTASIS CLIP PLACEMENT;  Surgeon: Wilford Corner, MD;  Location[facility];  Service: Endoscopy;;   HOT HEMOSTASIS N/A 04/27/2014   Procedure: HOT HEMOSTASIS (ARGON PLASMA COAGULATION/BICAP);  Surgeon: Cleotis Nipper, MD;  Location: [facility] ENDOSCOPY;  Service: Endoscopy;  Laterality: N/A;   HOT HEMOSTASIS N/A 09/30/2017   Procedure: HOT HEMOSTASIS (ARGON PLASMA COAGULATION/BICAP);  Surgeon: [name], MD;  Location: [place];  Service: Gastroenterology;  Laterality: N/A;   HOT HEMOSTASIS N/A 10/01/2017   Procedure: HOT HEMOSTASIS (ARGON PLASMA COAGULATION/BICAP);  Surgeon: [name], MD;  Location: [place];  Service: Gastroenterology;  Laterality: N/A;   HOT HEMOSTASIS N/A 10/17/2017   Procedure: HOT HEMOSTASIS (ARGON PLASMA COAGULATION/BICAP);  Surgeon: Otis Brace, MD;  Location: [facility] ENDOSCOPY;  Service: Gastroenterology;  Laterality: N/A;   HOT HEMOSTASIS N/A 10/19/2017   Procedure: HOT HEMOSTASIS (ARGON PLASMA COAGULATION/BICAP);  Surgeon: Otis Brace, MD;  Location: [facility] ENDOSCOPY;  Service: Gastroenterology;  Laterality: N/A;   HOT HEMOSTASIS N/A 03/02/2020   Procedure: HOT HEMOSTASIS (ARGON PLASMA COAGULATION/BICAP);  Surgeon: Ronald Lobo, MD;  Location[facility];  Service: Endoscopy;  Laterality: N/A;   IR IMAGING GUIDED PORT  INSERTION  07/08/2018   L shoulder Surgery  2011   POLYPECTOMY  03/02/2020   Procedure: POLYPECTOMY;  Surgeon: Ronald Lobo, MD;  Location: [facility];  Service: Endoscopy;;   SCLEROTHERAPY  11/03/2020   Procedure: Clide Deutscher;  Surgeon: Wilford Corner, MD;  Location: [facility] ENDOSCOPY;  Service: Endoscopy;;   SUBMUCOSAL INJECTION  09/22/2017   Procedure: SUBMUCOSAL INJECTION;  Surgeon: Clarene Essex, MD;  Location: [place];  Service: Endoscopy;;   SUBMUCOSAL INJECTION  12/04/2018   Procedure: SUBMUCOSAL INJECTION;  Surgeon: Wilford Corner, MD;  Location: [facility]OS[facility];  Service: Endoscopy;;    I have reviewed the social history and family history with the patient and they are unchanged from previous note.  ALLERGIES:  is allergic to feraheme [ferumoxytol], nsaids, tomato, iron (ferrous sulfate) [ferrous sulfate er], other, and wasp venom.  MEDICATIONS:  Current Outpatient Medications  Medication Sig Dispense Refill   Accu-Chek Softclix Lancets lancets Use to check blood sugar before breakfast and before dinner while on steroids (Patient taking differently: 1 each by Other route See admin instructions. Use to check blood sugar before breakfast and before dinner while on steroids) 100 each 1   acitretin (SORIATANE) 10 MG capsule Take 10 mg by mouth daily.     ALPRAZolam (XANAX) 0.5 MG tablet TAKE 1 TABLET(0.5 MG) BY MOUTH AT BEDTIME AS NEEDED FOR ANXIETY 30 tablet 0   Aminocaproic Acid 1000 MG TABS Take 1 tablet (1,000 mg total) by mouth  in the morning and at bedtime. 60 tablet 2   amLODipine (NORVASC) 10 MG tablet Take 1 tablet (10 mg total) by mouth daily. 90 tablet 0   atorvastatin (LIPITOR) 80 MG tablet TAKE 1 TABLET(80 MG) BY MOUTH DAILY 30 tablet 3   diphenoxylate-atropine (LOMOTIL) 2.5-0.025 MG tablet 1 to 2 PO QID prn diarrhea 30 tablet 1   furosemide (LASIX) 40 MG tablet Take 40 mg by mouth daily.     glucose blood (ACCU-CHEK GUIDE) test strip Check blood sugar 2 times per  day while on steroids before breakfast and dinner (Patient taking differently: 1 each by Other route See admin instructions. Check blood sugar 2 times per day while on steroids before breakfast and dinner) 50 each 3   hydrochlorothiazide (HYDRODIURIL) 12.5 MG tablet TAKE 1 TABLET(12.5 MG) BY MOUTH DAILY 90 tablet 1   lidocaine-prilocaine (EMLA) cream Apply 1 application topically as needed. 30 g 0   metFORMIN (GLUCOPHAGE) 500 MG tablet TAKE 1 TABLET(500 MG) BY MOUTH TWICE DAILY WITH A MEAL 180 tablet 3   methocarbamol (ROBAXIN) 500 MG tablet Take 1 tablet (500 mg total) by mouth every 8 (eight) hours as needed for muscle spasms (back pain/spasm). 7 tablet 0   metoprolol succinate (TOPROL-XL) 100 MG 24 hr tablet Take 100 mg by mouth daily.     pantoprazole (PROTONIX) 40 MG tablet TAKE 1 TABLET(40 MG) BY MOUTH TWICE DAILY 60 tablet 5   Podiatric Products (FLEXITOL HEEL BALM) OINT Apply 1 application topically as needed (dry skin).      potassium chloride (KLOR-CON) 20 MEQ packet Take 20 mEq by mouth daily. 30 packet 1   PROAIR HFA 108 (90 Base) MCG/ACT inhaler INHALE 1 TO 2 PUFFS INTO THE LUNGS EVERY 6 HOURS AS NEEDED FOR WHEEZING OR SHORTNESS OF BREATH 8.5 g 0   prochlorperazine (COMPAZINE) 10 MG tablet Take 1 tablet (10 mg total) by mouth every 6 (six) hours as needed for nausea or vomiting. 30 tablet 1   sertraline (ZOLOFT) 25 MG tablet Take 5 tablets (125 mg total) by mouth daily. 150 tablet 2   terbinafine (LAMISIL AT) 1 % cream Apply 1 application topically 2 (two) times daily. Both bottom and top of both feet and toes 36 g 0   traZODone (DESYREL) 100 MG tablet TAKE 1 TABLET(100 MG) BY MOUTH AT BEDTIME 30 tablet 2   No current facility-administered medications for this visit.   Facility-Administered Medications Ordered in Other Visits  Medication Dose Route Frequency Provider Last Rate Last Admin   diphenhydrAMINE (BENADRYL) 25 mg capsule            diphenhydrAMINE (BENADRYL) 25 mg capsule             phenylephrine (NEO-SYNEPHRINE) 1 % nasal drops 2 drop  2 drop Left Nare Once Truitt Merle, MD       sodium chloride flush (NS) 0.9 % injection 10 mL  10 mL Intracatheter PRN Truitt Merle, MD   10 mL at 08/14/20 1725    PHYSICAL EXAMINATION: ECOG PERFORMANCE STATUS: 3 - Symptomatic, >50% confined to bed  Vitals:   05/10/21 1141  BP: (!) 147/68  Pulse: 65  Resp: 16  Temp: 98.8 F (37.1 C)  SpO2: 100%   Wt Readings from Last 3 Encounters:  05/10/21 238 lb 6.4 oz (108.1 kg)  05/03/21 234 lb (106.1 kg)  04/20/21 235 lb 4 oz (106.7 kg)     GENERAL:alert, no distress and comfortable SKIN: skin color normal, no rashes or  significant lesions EYES: normal, Conjunctiva are pink and non-injected, sclera clear  NEURO: alert & oriented x 3 with fluent speech  LABORATORY DATA:  I have reviewed the data as listed CBC Latest Ref Rng & Units 05/10/2021 05/03/2021 04/20/2021  WBC 4.0 - 10.5 K/uL 10.7(H) 12.9(H) 10.0  Hemoglobin 12.0 - 15.0 g/dL 8.8(L) 6.9(LL) 6.4(LL)  Hematocrit 36.0 - 46.0 % 29.7(L) 23.2(L) 22.2(L)  Platelets 150 - 400 K/uL 345 285 274     CMP Latest Ref Rng & Units 05/03/2021 04/13/2021 03/30/2021  Glucose 70 - 99 mg/dL 125(H) 94 101(H)  BUN 6 - 20 mg/dL 12 13 9   Creatinine 0.44 - 1.00 mg/dL 0.92 0.93 0.79  Sodium 135 - 145 mmol/L 140 141 141  Potassium 3.5 - 5.1 mmol/L 3.2(L) 3.7 3.7  Chloride 98 - 111 mmol/L 106 107 107  CO2 22 - 32 mmol/L 24 25 25   Calcium 8.9 - 10.3 mg/dL 9.4 9.1 9.0  Total Protein 6.5 - 8.1 g/dL 7.0 7.4 7.6  Total Bilirubin 0.3 - 1.2 mg/dL 0.2(L) 0.3 0.2(L)  Alkaline Phos 38 - 126 U/L 59 53 78  AST 15 - 41 U/L 12(L) 12(L) 14(L)  ALT 0 - 44 U/L 6 5 5       RADIOGRAPHIC STUDIES: I have personally reviewed the radiological images as listed and agreed with the findings in the report. No results found.    No orders of the defined types were placed in this encounter.  All questions were answered. The patient knows to call the clinic with any  problems, questions or concerns. No barriers to learning was detected. The total time spent in the appointment was 30 minutes.     Truitt Merle, MD 05/10/2021   I, Wilburn Mylar, am acting as scribe for Truitt Merle, MD.   I have reviewed the above documentation for accuracy and completeness, and I agree with the above.

## 2021-05-10 NOTE — Patient Instructions (Signed)
Sodium Ferric Gluconate Complex Injection ?What is this medication? ?SODIUM FERRIC GLUCONATE COMPLEX (SOE dee um FER ik GLOO koe nate KOM pleks) treats low levels of iron (iron deficiency anemia) in people with kidney disease. Iron is a mineral that plays an important role in making red blood cells, which carry oxygen from your lungs to the rest of your body. ?This medicine may be used for other purposes; ask your health care provider or pharmacist if you have questions. ?COMMON BRAND NAME(S): Ferrlecit, Nulecit ?What should I tell my care team before I take this medication? ?They need to know if you have any of the following conditions: ?Anemia that is not from iron deficiency ?High levels of iron in the blood ?An unusual or allergic reaction to iron, other medications, foods, dyes, or preservatives ?Pregnant or are trying to become pregnant ?Breast-feeding ?How should I use this medication? ?This medication is injected into a vein. It is given by your care team in a hospital or clinic setting. ?Talk to your care team about the use of this medication in children. While it may be prescribed for children as young as 6 years for selected conditions, precautions do apply. ?Overdosage: If you think you have taken too much of this medicine contact a poison control center or emergency room at once. ?NOTE: This medicine is only for you. Do not share this medicine with others. ?What if I miss a dose? ?It is important not to miss your dose. Call your care team if you are unable to keep an appointment. ?What may interact with this medication? ?Do not take this medication with any of the following: ?Deferasirox ?Deferoxamine ?Dimercaprol ?This medication may also interact with the following: ?Other iron products ?This list may not describe all possible interactions. Give your health care provider a list of all the medicines, herbs, non-prescription drugs, or dietary supplements you use. Also tell them if you smoke, drink  alcohol, or use illegal drugs. Some items may interact with your medicine. ?What should I watch for while using this medication? ?Your condition will be monitored carefully while you are receiving this medication. ?Visit your care team for regular checks on your progress. You may need blood work while you are taking this medication. ?What side effects may I notice from receiving this medication? ?Side effects that you should report to your care team as soon as possible: ?Allergic reactions--skin rash, itching, hives, swelling of the face, lips, tongue, or throat ?Low blood pressure--dizziness, feeling faint or lightheaded, blurry vision ?Shortness of breath ?Side effects that usually do not require medical attention (report to your care team if they continue or are bothersome): ?Flushing ?Headache ?Joint pain ?Muscle pain ?Nausea ?Pain, redness, or irritation at injection site ?This list may not describe all possible side effects. Call your doctor for medical advice about side effects. You may report side effects to FDA at 1-800-FDA-1088. ?Where should I keep my medication? ?This medication is given in a hospital or clinic and will not be stored at home. ?NOTE: This sheet is a summary. It may not cover all possible information. If you have questions about this medicine, talk to your doctor, pharmacist, or health care provider. ?? 2022 Elsevier/Gold Standard (2020-08-25 00:00:00) ? ?

## 2021-05-10 NOTE — Telephone Encounter (Signed)
Scheduled per 1/26 los, calender given to pt while they were still in infusion

## 2021-05-17 ENCOUNTER — Inpatient Hospital Stay: Payer: Medicaid Other

## 2021-05-21 ENCOUNTER — Ambulatory Visit: Payer: Medicaid Other | Admitting: Behavioral Health

## 2021-05-21 DIAGNOSIS — F331 Major depressive disorder, recurrent, moderate: Secondary | ICD-10-CM

## 2021-05-21 DIAGNOSIS — F4321 Adjustment disorder with depressed mood: Secondary | ICD-10-CM

## 2021-05-21 DIAGNOSIS — F419 Anxiety disorder, unspecified: Secondary | ICD-10-CM

## 2021-05-21 NOTE — BH Specialist Note (Signed)
Integrated Behavioral Health via Telemedicine Visit  05/21/2021 Peggy House [id number]  Number of Integrated Behavioral Health visits: 11 Session Start time: 1:10pm  Session End time: 1:30pm Total time: 20  Referring Provider: Dr. [name], MD Patient/Family location: Pt is home in private [facility] Provider location: [facility] Office All persons participating in visit: Pt & Clinician Types of Service: Individual psychotherapy  I connected with Peggy House and/or Peggy House  self  via  Telephone or Video Enabled Telemedicine Application  (Video is Caregility application) and verified that I am speaking with the correct person using two identifiers. Discussed confidentiality:  11th visit  I discussed the limitations of telemedicine and the availability of in person appointments.  Discussed there is a possibility of technology failure and discussed alternative modes of communication if that failure occurs.  I discussed that engaging in this telemedicine visit, they consent to the provision of behavioral healthcare and the services will be billed under their insurance.  Patient and/or legal guardian expressed understanding and consented to Telemedicine visit:  11th visit  Presenting Concerns: Patient and/or family reports the following symptoms/concerns: Pt reports feeling down yesterday thinking about her Husb Robert's death from a Motorist who hit him Duration of problem: months; Severity of problem: moderate  Patient and/or Family's Strengths/Protective Factors: Social and Emotional competence, Sense of purpose, and grief reaction she accepts as part of life  Goals Addressed: Patient will:  Reduce symptoms of:  grief reaction to Husb's unexpected death last year    Increase knowledge and/or ability of: coping skills and stress reduction   Demonstrate ability to: Increase healthy adjustment to current life circumstances  Progress towards  Goals: Ongoing  Interventions: Interventions utilized:   Grief therapy  to normalize & validate Pt's own unique reaction to her Husb's death Standardized Assessments completed:  screeners prn  Patient and/or Family Response: Pt receptive to call today & requests future sessions  Assessment: Patient currently experiencing inc in grief & bereavement due to recent passing of Husb's Birthday in Jan on the 11th.   Patient may benefit from cont'd support & mental health wellness check.  Plan: Follow up with behavioral health clinician on : 2-3 wks on telehealth for 30 min Behavioral recommendations: Dbl-ck appt times before travelling  Referral(s): [place] (In Clinic)  I discussed the assessment and treatment plan with the patient and/or parent/guardian. They were provided an opportunity to ask questions and all were answered. They agreed with the plan and demonstrated an understanding of the instructions.   They were advised to call back or seek an in-person evaluation if the symptoms worsen or if the condition fails to improve as anticipated.  [name], LMFT

## 2021-05-24 ENCOUNTER — Inpatient Hospital Stay: Payer: Medicaid Other | Attending: Hematology

## 2021-05-24 ENCOUNTER — Inpatient Hospital Stay: Payer: Medicaid Other

## 2021-05-24 ENCOUNTER — Other Ambulatory Visit: Payer: Self-pay

## 2021-05-24 VITALS — BP 98/71 | HR 66 | Temp 97.9°F | Resp 16 | Wt 237.5 lb

## 2021-05-24 DIAGNOSIS — Q2733 Arteriovenous malformation of digestive system vessel: Secondary | ICD-10-CM

## 2021-05-24 DIAGNOSIS — K922 Gastrointestinal hemorrhage, unspecified: Secondary | ICD-10-CM | POA: Insufficient documentation

## 2021-05-24 DIAGNOSIS — Z79899 Other long term (current) drug therapy: Secondary | ICD-10-CM | POA: Diagnosis not present

## 2021-05-24 DIAGNOSIS — Z5112 Encounter for antineoplastic immunotherapy: Secondary | ICD-10-CM | POA: Diagnosis not present

## 2021-05-24 DIAGNOSIS — D500 Iron deficiency anemia secondary to blood loss (chronic): Secondary | ICD-10-CM | POA: Diagnosis present

## 2021-05-24 DIAGNOSIS — D649 Anemia, unspecified: Secondary | ICD-10-CM

## 2021-05-24 DIAGNOSIS — I780 Hereditary hemorrhagic telangiectasia: Secondary | ICD-10-CM | POA: Diagnosis present

## 2021-05-24 DIAGNOSIS — Z95828 Presence of other vascular implants and grafts: Secondary | ICD-10-CM

## 2021-05-24 LAB — TOTAL PROTEIN, URINE DIPSTICK: Protein, ur: 30 mg/dL — AB

## 2021-05-24 LAB — CBC WITH DIFFERENTIAL (CANCER CENTER ONLY)
Abs Immature Granulocytes: 0.14 10*3/uL — ABNORMAL HIGH (ref 0.00–0.07)
Basophils Absolute: 0 10*3/uL (ref 0.0–0.1)
Basophils Relative: 0 %
Eosinophils Absolute: 0.1 10*3/uL (ref 0.0–0.5)
Eosinophils Relative: 1 %
HCT: 24 % — ABNORMAL LOW (ref 36.0–46.0)
Hemoglobin: 7.2 g/dL — ABNORMAL LOW (ref 12.0–15.0)
Immature Granulocytes: 1 %
Lymphocytes Relative: 11 %
Lymphs Abs: 1.3 10*3/uL (ref 0.7–4.0)
MCH: 25.3 pg — ABNORMAL LOW (ref 26.0–34.0)
MCHC: 30 g/dL (ref 30.0–36.0)
MCV: 84.2 fL (ref 80.0–100.0)
Monocytes Absolute: 0.6 10*3/uL (ref 0.1–1.0)
Monocytes Relative: 5 %
Neutro Abs: 9.5 10*3/uL — ABNORMAL HIGH (ref 1.7–7.7)
Neutrophils Relative %: 82 %
Platelet Count: 289 10*3/uL (ref 150–400)
RBC: 2.85 MIL/uL — ABNORMAL LOW (ref 3.87–5.11)
RDW: 21.1 % — ABNORMAL HIGH (ref 11.5–15.5)
WBC Count: 11.7 10*3/uL — ABNORMAL HIGH (ref 4.0–10.5)
nRBC: 0 % (ref 0.0–0.2)

## 2021-05-24 LAB — CMP (CANCER CENTER ONLY)
ALT: 6 U/L (ref 0–44)
AST: 13 U/L — ABNORMAL LOW (ref 15–41)
Albumin: 4 g/dL (ref 3.5–5.0)
Alkaline Phosphatase: 64 U/L (ref 38–126)
Anion gap: 7 (ref 5–15)
BUN: 12 mg/dL (ref 6–20)
CO2: 27 mmol/L (ref 22–32)
Calcium: 9.2 mg/dL (ref 8.9–10.3)
Chloride: 107 mmol/L (ref 98–111)
Creatinine: 0.81 mg/dL (ref 0.44–1.00)
GFR, Estimated: >60 mL/min (ref >60)
Glucose, Bld: 109 mg/dL — ABNORMAL HIGH (ref 70–99)
Potassium: 3.8 mmol/L (ref 3.5–5.1)
Sodium: 141 mmol/L (ref 135–145)
Total Bilirubin: 0.2 mg/dL — ABNORMAL LOW (ref 0.3–1.2)
Total Protein: 7.4 g/dL (ref 6.5–8.1)

## 2021-05-24 LAB — SAMPLE TO BLOOD BANK

## 2021-05-24 LAB — FERRITIN: Ferritin: 67 ng/mL (ref 11–307)

## 2021-05-24 MED ORDER — SODIUM CHLORIDE 0.9% FLUSH
10.0000 mL | INTRAVENOUS | Status: DC | PRN
  Administered 2021-05-24: 10 mL

## 2021-05-24 MED ORDER — SODIUM CHLORIDE 0.9 % IV SOLN: Freq: Once | INTRAVENOUS | Status: AC

## 2021-05-24 MED ORDER — HEPARIN SOD (PORK) LOCK FLUSH 100 UNIT/ML IV SOLN
500.0000 [IU] | Freq: Once | INTRAVENOUS | Status: AC | PRN
  Administered 2021-05-24: 500 [IU]

## 2021-05-24 MED ORDER — SODIUM CHLORIDE 0.9 % IV SOLN
5.0000 mg/kg | Freq: Once | INTRAVENOUS | Status: AC
  Administered 2021-05-24: 500 mg via INTRAVENOUS
  Filled 2021-05-24: qty 16

## 2021-05-24 MED ORDER — SODIUM CHLORIDE 0.9% FLUSH
10.0000 mL | Freq: Once | INTRAVENOUS | Status: AC
  Administered 2021-05-24: 10 mL

## 2021-05-24 MED ORDER — SODIUM CHLORIDE 0.9 % IV SOLN
250.0000 mg | Freq: Once | INTRAVENOUS | Status: AC
  Administered 2021-05-24: 250 mg via INTRAVENOUS
  Filled 2021-05-24: qty 20

## 2021-05-24 NOTE — Patient Instructions (Addendum)
[facility]  Discharge Instructions: Thank you for choosing [facility] to provide your oncology and hematology care.   If you have a lab appointment with the [facility], please go directly to the [facility] and check in at the registration area.   Wear comfortable clothing and clothing appropriate for easy access to any Portacath or PICC line.   We strive to give you quality time with your provider. You may need to reschedule your appointment if you arrive late (15 or more minutes).  Arriving late affects you and other patients whose appointments are after yours.  Also, if you miss three or more appointments without notifying the office, you may be dismissed from the clinic at the providers discretion.      For prescription refill requests, have your pharmacy contact our office and allow 72 hours for refills to be completed.    Today you received the following chemotherapy and/or immunotherapy agents Bevacizumab (Avastin)      To help prevent nausea and vomiting after your treatment, we encourage you to take your nausea medication as directed.  BELOW ARE SYMPTOMS THAT SHOULD BE REPORTED IMMEDIATELY: *FEVER GREATER THAN 100.4 F (38 C) OR HIGHER *CHILLS OR SWEATING *NAUSEA AND VOMITING THAT IS NOT CONTROLLED WITH YOUR NAUSEA MEDICATION *UNUSUAL SHORTNESS OF BREATH *UNUSUAL BRUISING OR BLEEDING *URINARY PROBLEMS (pain or burning when urinating, or frequent urination) *BOWEL PROBLEMS (unusual diarrhea, constipation, pain near the anus) TENDERNESS IN MOUTH AND THROAT WITH OR WITHOUT PRESENCE OF ULCERS (sore throat, sores in mouth, or a toothache) UNUSUAL RASH, SWELLING OR PAIN  UNUSUAL VAGINAL DISCHARGE OR ITCHING   Items with * indicate a potential emergency and should be followed up as soon as possible or go to the Emergency Department if any problems should occur.  Please show the CHEMOTHERAPY ALERT CARD or IMMUNOTHERAPY ALERT CARD at  check-in to the Emergency Department and triage nurse.  Should you have questions after your visit or need to cancel or reschedule your appointment, please contact [facility]  Dept: [phone]  and follow the prompts.  Office hours are 8:00 a.m. to 4:30 p.m. Monday - Friday. Please note that voicemails left after 4:00 p.m. may not be returned until the following business day.  We are closed weekends and major holidays. You have access to a nurse at all times for urgent questions. Please call the main number to the clinic Dept: [phone] and follow the prompts.   For any non-urgent questions, you may also contact your provider using MyChart. We now offer e-Visits for anyone 41 and older to request care online for non-urgent symptoms. For details visit [email or web address].   Also download the MyChart app! Go to the app store, search "MyChart", open the app, select [facility], and log in with your MyChart username and password.  Due to Covid, a mask is required upon entering the hospital/clinic. If you do not have a mask, one will be given to you upon arrival. For doctor visits, patients may have 1 support person aged 30 or older with them. For treatment visits, patients cannot have anyone with them due to current Covid guidelines and our immunocompromised population.    Sodium Ferric Gluconate Complex Injection What is this medication? SODIUM FERRIC GLUCONATE COMPLEX (SOE dee um FER ik GLOO koe nate KOM pleks) treats low levels of iron (iron deficiency anemia) in people with kidney disease. Iron is a mineral that plays an important role in making  red blood cells, which carry oxygen from your lungs to the rest of your body. This medicine may be used for other purposes; ask your health care provider or pharmacist if you have questions. COMMON BRAND NAME(S): Ferrlecit, Nulecit What should I tell my care team before I take this medication? They need to know if you  have any of the following conditions: Anemia that is not from iron deficiency High levels of iron in the blood An unusual or allergic reaction to iron, other medications, foods, dyes, or preservatives Pregnant or are trying to become pregnant Breast-feeding How should I use this medication? This medication is injected into a vein. It is given by your care team in a hospital or clinic setting. Talk to your care team about the use of this medication in children. While it may be prescribed for children as young as 6 years for selected conditions, precautions do apply. Overdosage: If you think you have taken too much of this medicine contact a poison control center or emergency room at once. NOTE: This medicine is only for you. Do not share this medicine with others. What if I miss a dose? It is important not to miss your dose. Call your care team if you are unable to keep an appointment. What may interact with this medication? Do not take this medication with any of the following: Deferasirox Deferoxamine Dimercaprol This medication may also interact with the following: Other iron products This list may not describe all possible interactions. Give your health care provider a list of all the medicines, herbs, non-prescription drugs, or dietary supplements you use. Also tell them if you smoke, drink alcohol, or use illegal drugs. Some items may interact with your medicine. What should I watch for while using this medication? Your condition will be monitored carefully while you are receiving this medication. Visit your care team for regular checks on your progress. You may need blood work while you are taking this medication. What side effects may I notice from receiving this medication? Side effects that you should report to your care team as soon as possible: Allergic reactions--skin rash, itching, hives, swelling of the face, lips, tongue, or throat Low blood pressure--dizziness, feeling  faint or lightheaded, blurry vision Shortness of breath Side effects that usually do not require medical attention (report to your care team if they continue or are bothersome): Flushing Headache Joint pain Muscle pain Nausea Pain, redness, or irritation at injection site This list may not describe all possible side effects. Call your doctor for medical advice about side effects. You may report side effects to FDA at 1-800-FDA-1088. Where should I keep my medication? This medication is given in a hospital or clinic and will not be stored at home. NOTE: This sheet is a summary. It may not cover all possible information. If you have questions about this medicine, talk to your doctor, pharmacist, or health care provider.  2022 Elsevier/Gold Standard (2020-08-25 00:00:00)

## 2021-05-25 DIAGNOSIS — N319 Neuromuscular dysfunction of bladder, unspecified: Secondary | ICD-10-CM | POA: Diagnosis not present

## 2021-05-25 DIAGNOSIS — N3941 Urge incontinence: Secondary | ICD-10-CM | POA: Diagnosis not present

## 2021-05-28 ENCOUNTER — Other Ambulatory Visit: Payer: Self-pay | Admitting: Hematology

## 2021-05-29 ENCOUNTER — Encounter: Payer: Self-pay | Admitting: Hematology

## 2021-05-31 ENCOUNTER — Inpatient Hospital Stay: Payer: Medicaid Other

## 2021-05-31 ENCOUNTER — Other Ambulatory Visit: Payer: Self-pay

## 2021-05-31 VITALS — BP 141/66 | HR 70 | Temp 98.9°F | Resp 17

## 2021-05-31 DIAGNOSIS — Z5112 Encounter for antineoplastic immunotherapy: Secondary | ICD-10-CM | POA: Diagnosis not present

## 2021-05-31 DIAGNOSIS — I780 Hereditary hemorrhagic telangiectasia: Secondary | ICD-10-CM

## 2021-05-31 DIAGNOSIS — D500 Iron deficiency anemia secondary to blood loss (chronic): Secondary | ICD-10-CM

## 2021-05-31 DIAGNOSIS — Z95828 Presence of other vascular implants and grafts: Secondary | ICD-10-CM

## 2021-05-31 DIAGNOSIS — D649 Anemia, unspecified: Secondary | ICD-10-CM

## 2021-05-31 LAB — CMP (CANCER CENTER ONLY)
ALT: 5 U/L (ref 0–44)
AST: 11 U/L — ABNORMAL LOW (ref 15–41)
Albumin: 3.8 g/dL (ref 3.5–5.0)
Alkaline Phosphatase: 60 U/L (ref 38–126)
Anion gap: 8 (ref 5–15)
BUN: 11 mg/dL (ref 6–20)
CO2: 25 mmol/L (ref 22–32)
Calcium: 8.8 mg/dL — ABNORMAL LOW (ref 8.9–10.3)
Chloride: 108 mmol/L (ref 98–111)
Creatinine: 0.91 mg/dL (ref 0.44–1.00)
GFR, Estimated: >60 mL/min (ref >60)
Glucose, Bld: 105 mg/dL — ABNORMAL HIGH (ref 70–99)
Potassium: 3.5 mmol/L (ref 3.5–5.1)
Sodium: 141 mmol/L (ref 135–145)
Total Bilirubin: 0.2 mg/dL — ABNORMAL LOW (ref 0.3–1.2)
Total Protein: 6.9 g/dL (ref 6.5–8.1)

## 2021-05-31 LAB — SAMPLE TO BLOOD BANK

## 2021-05-31 LAB — CBC WITH DIFFERENTIAL (CANCER CENTER ONLY)
Abs Immature Granulocytes: 0.03 10*3/uL (ref 0.00–0.07)
Basophils Absolute: 0 10*3/uL (ref 0.0–0.1)
Basophils Relative: 0 %
Eosinophils Absolute: 0.1 10*3/uL (ref 0.0–0.5)
Eosinophils Relative: 1 %
HCT: 24.7 % — ABNORMAL LOW (ref 36.0–46.0)
Hemoglobin: 7.1 g/dL — ABNORMAL LOW (ref 12.0–15.0)
Immature Granulocytes: 0 %
Lymphocytes Relative: 15 %
Lymphs Abs: 1.4 10*3/uL (ref 0.7–4.0)
MCH: 25.3 pg — ABNORMAL LOW (ref 26.0–34.0)
MCHC: 28.7 g/dL — ABNORMAL LOW (ref 30.0–36.0)
MCV: 87.9 fL (ref 80.0–100.0)
Monocytes Absolute: 0.5 10*3/uL (ref 0.1–1.0)
Monocytes Relative: 6 %
Neutro Abs: 7.3 10*3/uL (ref 1.7–7.7)
Neutrophils Relative %: 78 %
Platelet Count: 236 10*3/uL (ref 150–400)
RBC: 2.81 MIL/uL — ABNORMAL LOW (ref 3.87–5.11)
RDW: 22.3 % — ABNORMAL HIGH (ref 11.5–15.5)
WBC Count: 9.3 10*3/uL (ref 4.0–10.5)
nRBC: 0 % (ref 0.0–0.2)

## 2021-05-31 LAB — PREPARE RBC (CROSSMATCH)

## 2021-05-31 MED ORDER — SODIUM CHLORIDE 0.9 % IV SOLN
250.0000 mg | Freq: Once | INTRAVENOUS | Status: AC
  Administered 2021-05-31: 250 mg via INTRAVENOUS
  Filled 2021-05-31: qty 20

## 2021-05-31 MED ORDER — SODIUM CHLORIDE 0.9% FLUSH
10.0000 mL | INTRAVENOUS | Status: DC | PRN
  Administered 2021-05-31: 10 mL via INTRAVENOUS

## 2021-05-31 NOTE — Progress Notes (Signed)
Spoke with Peggy House in [place] regarding T&S and prepare orders for blood.  Orders are confirmed.

## 2021-06-02 ENCOUNTER — Other Ambulatory Visit: Payer: Self-pay

## 2021-06-02 ENCOUNTER — Inpatient Hospital Stay: Payer: Medicaid Other

## 2021-06-02 VITALS — BP 123/66 | HR 65 | Temp 98.1°F | Resp 18

## 2021-06-02 DIAGNOSIS — Z5112 Encounter for antineoplastic immunotherapy: Secondary | ICD-10-CM | POA: Diagnosis not present

## 2021-06-02 DIAGNOSIS — D500 Iron deficiency anemia secondary to blood loss (chronic): Secondary | ICD-10-CM

## 2021-06-02 DIAGNOSIS — Z95828 Presence of other vascular implants and grafts: Secondary | ICD-10-CM

## 2021-06-02 MED ORDER — HEPARIN SOD (PORK) LOCK FLUSH 100 UNIT/ML IV SOLN
500.0000 [IU] | Freq: Once | INTRAVENOUS | Status: AC
  Administered 2021-06-02: 500 [IU] via INTRAVENOUS

## 2021-06-02 MED ORDER — SODIUM CHLORIDE 0.9% FLUSH
10.0000 mL | Freq: Once | INTRAVENOUS | Status: AC
  Administered 2021-06-02: 10 mL via INTRAVENOUS

## 2021-06-02 MED ORDER — DIPHENHYDRAMINE HCL 25 MG PO CAPS
25.0000 mg | ORAL_CAPSULE | Freq: Once | ORAL | Status: AC
  Administered 2021-06-02: 25 mg via ORAL
  Filled 2021-06-02: qty 1

## 2021-06-02 MED ORDER — ACETAMINOPHEN 325 MG PO TABS
650.0000 mg | ORAL_TABLET | Freq: Once | ORAL | Status: AC
  Administered 2021-06-02: 650 mg via ORAL
  Filled 2021-06-02: qty 2

## 2021-06-02 MED ORDER — SODIUM CHLORIDE 0.9% IV SOLUTION
250.0000 mL | Freq: Once | INTRAVENOUS | Status: AC
  Administered 2021-06-02: 250 mL via INTRAVENOUS

## 2021-06-02 NOTE — Patient Instructions (Signed)
Blood Transfusion, Adult, Care After This sheet gives you information about how to care for yourself after your procedure. Your doctor may also give you more specific instructions. If you have problems or questions, contact your doctor. What can I expect after the procedure? After the procedure, it is common to have: Bruising and soreness at the IV site. A headache. Follow these instructions at home: Insertion site care   Follow instructions from your doctor about how to take care of your insertion site. This is where an IV tube was put into your vein. Make sure you: Wash your hands with soap and water before and after you change your bandage (dressing). If you cannot use soap and water, use hand sanitizer. Change your bandage as told by your doctor. Check your insertion site every day for signs of infection. Check for: Redness, swelling, or pain. Bleeding from the site. Warmth. Pus or a bad smell. General instructions Take over-the-counter and prescription medicines only as told by your doctor. Rest as told by your doctor. Go back to your normal activities as told by your doctor. Keep all follow-up visits as told by your doctor. This is important. Contact a doctor if: You have itching or red, swollen areas of skin (hives). You feel worried or nervous (anxious). You feel weak after doing your normal activities. You have redness, swelling, warmth, or pain around the insertion site. You have blood coming from the insertion site, and the blood does not stop with pressure. You have pus or a bad smell coming from the insertion site. Get help right away if: You have signs of a serious reaction. This may be coming from an allergy or the body's defense system (immune system). Signs include: Trouble breathing or shortness of breath. Swelling of the face or feeling warm (flushed). Fever or chills. Head, chest, or back pain. Dark pee (urine) or blood in the pee. Widespread rash. Fast  heartbeat. Feeling dizzy or light-headed. You may receive your blood transfusion in an outpatient setting. If so, you will be told whom to contact to report any reactions. These symptoms may be an emergency. Do not wait to see if the symptoms will go away. Get medical help right away. Call your local emergency services (911 in the [place] Do not drive yourself to the hospital. Summary Bruising and soreness at the IV site are common. Check your insertion site every day for signs of infection. Rest as told by your doctor. Go back to your normal activities as told by your doctor. Get help right away if you have signs of a serious reaction. This information is not intended to replace advice given to you by your health care provider. Make sure you discuss any questions you have with your health care provider. Document Revised: 07/27/2020 Document Reviewed: 09/24/2018 Elsevier Patient Education  2022 [employer]  

## 2021-06-04 LAB — BPAM RBC
Blood Product Expiration Date: 202303042359
ISSUE DATE / TIME: 202302181017
Unit Type and Rh: 600

## 2021-06-04 LAB — TYPE AND SCREEN
ABO/RH(D): A NEG
Antibody Screen: NEGATIVE
Unit division: 0

## 2021-06-06 ENCOUNTER — Inpatient Hospital Stay: Payer: Medicaid Other

## 2021-06-06 ENCOUNTER — Telehealth: Payer: Self-pay

## 2021-06-06 NOTE — Telephone Encounter (Signed)
This nurse attempted to reach patient times 2 related to missed appointment for port flush/ lab and infusion.  No answer.  This nurse left a message for patient to return call to the facility.

## 2021-06-07 ENCOUNTER — Other Ambulatory Visit: Payer: Self-pay | Admitting: Hematology

## 2021-06-13 ENCOUNTER — Inpatient Hospital Stay: Payer: Medicaid Other | Attending: Hematology

## 2021-06-13 ENCOUNTER — Inpatient Hospital Stay: Payer: Medicaid Other

## 2021-06-13 ENCOUNTER — Telehealth: Payer: Self-pay

## 2021-06-13 DIAGNOSIS — Z79899 Other long term (current) drug therapy: Secondary | ICD-10-CM | POA: Insufficient documentation

## 2021-06-13 DIAGNOSIS — D500 Iron deficiency anemia secondary to blood loss (chronic): Secondary | ICD-10-CM | POA: Insufficient documentation

## 2021-06-13 DIAGNOSIS — Z5112 Encounter for antineoplastic immunotherapy: Secondary | ICD-10-CM | POA: Insufficient documentation

## 2021-06-13 DIAGNOSIS — I780 Hereditary hemorrhagic telangiectasia: Secondary | ICD-10-CM | POA: Insufficient documentation

## 2021-06-13 NOTE — Telephone Encounter (Signed)
Called patient regarding missed appointment this morning. The patient said she forgot she had an appointment. I informed her that scheduling will reach out to her to get her rescheduled. She verbalized understanding and thanks.  ?

## 2021-06-14 ENCOUNTER — Other Ambulatory Visit: Payer: Self-pay

## 2021-06-14 ENCOUNTER — Inpatient Hospital Stay: Payer: Medicaid Other

## 2021-06-14 ENCOUNTER — Ambulatory Visit: Payer: Medicaid Other | Admitting: Behavioral Health

## 2021-06-14 VITALS — BP 140/82 | HR 65 | Temp 98.4°F | Resp 18 | Wt 240.5 lb

## 2021-06-14 DIAGNOSIS — D649 Anemia, unspecified: Secondary | ICD-10-CM

## 2021-06-14 DIAGNOSIS — Z5112 Encounter for antineoplastic immunotherapy: Secondary | ICD-10-CM | POA: Diagnosis not present

## 2021-06-14 DIAGNOSIS — I780 Hereditary hemorrhagic telangiectasia: Secondary | ICD-10-CM | POA: Diagnosis present

## 2021-06-14 DIAGNOSIS — Q2733 Arteriovenous malformation of digestive system vessel: Secondary | ICD-10-CM

## 2021-06-14 DIAGNOSIS — D500 Iron deficiency anemia secondary to blood loss (chronic): Secondary | ICD-10-CM

## 2021-06-14 DIAGNOSIS — F4321 Adjustment disorder with depressed mood: Secondary | ICD-10-CM

## 2021-06-14 DIAGNOSIS — Z95828 Presence of other vascular implants and grafts: Secondary | ICD-10-CM

## 2021-06-14 DIAGNOSIS — F331 Major depressive disorder, recurrent, moderate: Secondary | ICD-10-CM

## 2021-06-14 DIAGNOSIS — Z79899 Other long term (current) drug therapy: Secondary | ICD-10-CM | POA: Diagnosis not present

## 2021-06-14 DIAGNOSIS — F419 Anxiety disorder, unspecified: Secondary | ICD-10-CM

## 2021-06-14 LAB — CBC WITH DIFFERENTIAL (CANCER CENTER ONLY)
Abs Immature Granulocytes: 0.03 10*3/uL (ref 0.00–0.07)
Basophils Absolute: 0.1 10*3/uL (ref 0.0–0.1)
Basophils Relative: 0 %
Eosinophils Absolute: 0.1 10*3/uL (ref 0.0–0.5)
Eosinophils Relative: 1 %
HCT: 29.4 % — ABNORMAL LOW (ref 36.0–46.0)
Hemoglobin: 8.7 g/dL — ABNORMAL LOW (ref 12.0–15.0)
Immature Granulocytes: 0 %
Lymphocytes Relative: 11 %
Lymphs Abs: 1.3 10*3/uL (ref 0.7–4.0)
MCH: 25.2 pg — ABNORMAL LOW (ref 26.0–34.0)
MCHC: 29.6 g/dL — ABNORMAL LOW (ref 30.0–36.0)
MCV: 85.2 fL (ref 80.0–100.0)
Monocytes Absolute: 0.6 10*3/uL (ref 0.1–1.0)
Monocytes Relative: 5 %
Neutro Abs: 9.8 10*3/uL — ABNORMAL HIGH (ref 1.7–7.7)
Neutrophils Relative %: 83 %
Platelet Count: 266 10*3/uL (ref 150–400)
RBC: 3.45 MIL/uL — ABNORMAL LOW (ref 3.87–5.11)
RDW: 21.6 % — ABNORMAL HIGH (ref 11.5–15.5)
WBC Count: 11.9 10*3/uL — ABNORMAL HIGH (ref 4.0–10.5)
nRBC: 0 % (ref 0.0–0.2)

## 2021-06-14 LAB — TOTAL PROTEIN, URINE DIPSTICK: Protein, ur: 100 mg/dL — AB

## 2021-06-14 LAB — FERRITIN: Ferritin: 76 ng/mL (ref 11–307)

## 2021-06-14 LAB — SAMPLE TO BLOOD BANK

## 2021-06-14 MED ORDER — HEPARIN SOD (PORK) LOCK FLUSH 100 UNIT/ML IV SOLN
500.0000 [IU] | Freq: Once | INTRAVENOUS | Status: AC
  Administered 2021-06-14: 500 [IU]

## 2021-06-14 MED ORDER — SODIUM CHLORIDE 0.9% FLUSH
10.0000 mL | Freq: Once | INTRAVENOUS | Status: AC
  Administered 2021-06-14: 10 mL

## 2021-06-14 MED ORDER — SODIUM CHLORIDE 0.9 % IV SOLN: INTRAVENOUS | Status: DC

## 2021-06-14 MED ORDER — SODIUM CHLORIDE 0.9 % IV SOLN: Freq: Once | INTRAVENOUS | Status: DC

## 2021-06-14 MED ORDER — SODIUM CHLORIDE 0.9 % IV SOLN
250.0000 mg | Freq: Once | INTRAVENOUS | Status: AC
  Administered 2021-06-14: 250 mg via INTRAVENOUS
  Filled 2021-06-14: qty 20

## 2021-06-14 MED ORDER — SODIUM CHLORIDE 0.9 % IV SOLN
5.0000 mg/kg | Freq: Once | INTRAVENOUS | Status: AC
  Administered 2021-06-14: 500 mg via INTRAVENOUS
  Filled 2021-06-14: qty 16

## 2021-06-14 NOTE — BH Specialist Note (Signed)
Integrated Behavioral Health via Telemedicine Visit ? ?06/14/2021 ?Peggy House ?[id number] ? ?Number of [facility] Clinician visits: 12 ?Session Start time: 1:00pm ?Session End time: 1:15pm ?Total time in minutes: 15 min ? ?Referring Provider: Dr. Lily Kocher, MD ?Patient/Family location: Pt is @ [facility] for her infusion today-she is in the Wait Room & has limited privacy. Spoke w/Pt as a brief ck-in appt & will r/s her for another 30 min telehealth session @ nxt avail time ?[facility] Provider location: Working remotely ?All persons participating in visit: Pt & Clinician ?Types of Service: Individual psychotherapy ? ?I connected with [name] and/or Peggy House's  self  via  Telephone or Video Enabled Telemedicine Application  (Video is Caregility application) and verified that I am speaking with the correct person using two identifiers. Discussed confidentiality: Yes  ? ?I discussed the limitations of telemedicine and the availability of in person appointments.  Discussed there is a possibility of technology failure and discussed alternative modes of communication if that failure occurs. ? ?I discussed that engaging in this telemedicine visit, they consent to the provision of behavioral healthcare and the services will be billed under their insurance. ? ?Patient and/or legal guardian expressed understanding and consented to Telemedicine visit: Yes  ? ?Presenting Concerns: ?Patient and/or family reports the following symptoms/concerns: dec in anx/dep due to Son living @ home having a new job & another of her 3 Sons now temporarily living w/her [facility]. This Son is 61yo Psychologist, prison and probation services & he works for [employer]. ?Duration of problem: about 3-4 wks; Severity of problem: mild ? ?Patient and/or Family's Strengths/Protective Factors: ?Social and Emotional competence, Concrete supports in place (healthy food, safe environments, etc.), Sense of purpose, and Physical Health  (exercise, healthy diet, medication compliance, etc.) ? ?Goals Addressed: ?Patient will: ? Reduce symptoms of: anxiety & dep ? Increase knowledge and/or ability of: coping skills for fluctuating health status changes ? Demonstrate ability to: Increase healthy adjustment to current life circumstances and cont'd healthy grieving process for loss of Husb Robert late July 18, 2020 ? ?Progress towards Goals: ?Ongoing ? ?Interventions: ?Interventions utilized:   Grief Cslg & status update for Tx ?Standardized Assessments completed:  screeners prn ? ?Patient and/or Family Response: Pt is receptive to call today & realizes she needs to r/s. Pt requests future telehealth call @ nxt avail time for 30 min ? ?Assessment: ?Patient currently experiencing a dec in her levels of anx/dep & grief as she processes the death of her [name] Sons have rallied around her & she appreciates their respect & support.  ? ?Patient may benefit from cont'd sessions of Cslg to address her health issues & the death of her Husb in 18-Jul-2020. ? ?Plan: ?Follow up with behavioral health clinician on : Nxt avail 30 min ck-in session ?Behavioral recommendations: None today as call was brief ?Referral(s): [facility] (In Clinic) ? ?I discussed the assessment and treatment plan with the patient and/or parent/guardian. They were provided an opportunity to ask questions and all were answered. They agreed with the plan and demonstrated an understanding of the instructions. ?  ?They were advised to call back or seek an in-person evaluation if the symptoms worsen or if the condition fails to improve as anticipated. ? ?[name], LMFT ?

## 2021-06-14 NOTE — Patient Instructions (Signed)
[facility]  Discharge Instructions: ?Thank you for choosing [facility] to provide your oncology and hematology care.  ? ?If you have a lab appointment with the [facility], please go directly to the [facility] and check in at the registration area. ?  ?Wear comfortable clothing and clothing appropriate for easy access to any Portacath or PICC line.  ? ?We strive to give you quality time with your provider. You may need to reschedule your appointment if you arrive late (15 or more minutes).  Arriving late affects you and other patients whose appointments are after yours.  Also, if you miss three or more appointments without notifying the office, you may be dismissed from the clinic at the provider?s discretion.    ?  ?For prescription refill requests, have your pharmacy contact our office and allow 72 hours for refills to be completed.   ? ?Today you received the following chemotherapy and/or immunotherapy agents Bevacizumab (Avastin)    ?  ?To help prevent nausea and vomiting after your treatment, we encourage you to take your nausea medication as directed. ? ?BELOW ARE SYMPTOMS THAT SHOULD BE REPORTED IMMEDIATELY: ?*FEVER GREATER THAN 100.4 F (38 ?C) OR HIGHER ?*CHILLS OR SWEATING ?*NAUSEA AND VOMITING THAT IS NOT CONTROLLED WITH YOUR NAUSEA MEDICATION ?*UNUSUAL SHORTNESS OF BREATH ?*UNUSUAL BRUISING OR BLEEDING ?*URINARY PROBLEMS (pain or burning when urinating, or frequent urination) ?*BOWEL PROBLEMS (unusual diarrhea, constipation, pain near the anus) ?TENDERNESS IN MOUTH AND THROAT WITH OR WITHOUT PRESENCE OF ULCERS (sore throat, sores in mouth, or a toothache) ?UNUSUAL RASH, SWELLING OR PAIN  ?UNUSUAL VAGINAL DISCHARGE OR ITCHING  ? ?Items with * indicate a potential emergency and should be followed up as soon as possible or go to the Emergency Department if any problems should occur. ? ?Please show the CHEMOTHERAPY ALERT CARD or IMMUNOTHERAPY ALERT CARD at  check-in to the Emergency Department and triage nurse. ? ?Should you have questions after your visit or need to cancel or reschedule your appointment, please contact [facility]  Dept: [phone]  and follow the prompts.  Office hours are 8:00 a.m. to 4:30 p.m. Monday - Friday. Please note that voicemails left after 4:00 p.m. may not be returned until the following business day.  We are closed weekends and major holidays. You have access to a nurse at all times for urgent questions. Please call the main number to the clinic Dept: [phone] and follow the prompts. ? ? ?For any non-urgent questions, you may also contact your provider using MyChart. We now offer e-Visits for anyone 65 and older to request care online for non-urgent symptoms. For details visit [email or web address]. ?  ?Also download the MyChart app! Go to the app store, search "MyChart", open the app, select , and log in with your MyChart username and password. ? ?Due to Covid, a mask is required upon entering the hospital/clinic. If you do not have a mask, one will be given to you upon arrival. For doctor visits, patients may have 1 support person aged 21 or older with them. For treatment visits, patients cannot have anyone with them due to current Covid guidelines and our immunocompromised population.  ? ? ?Sodium Ferric Gluconate Complex Injection ?What is this medication? ?SODIUM FERRIC GLUCONATE COMPLEX (SOE dee um FER ik GLOO koe nate KOM pleks) treats low levels of iron (iron deficiency anemia) in people with kidney disease. Iron is a mineral that plays an important role in making  red blood cells, which carry oxygen from your lungs to the rest of your body. ?This medicine may be used for other purposes; ask your health care provider or pharmacist if you have questions. ?COMMON BRAND NAME(S): Ferrlecit, Nulecit ?What should I tell my care team before I take this medication? ?They need to know if you  have any of the following conditions: ?Anemia that is not from iron deficiency ?High levels of iron in the blood ?An unusual or allergic reaction to iron, other medications, foods, dyes, or preservatives ?Pregnant or are trying to become pregnant ?Breast-feeding ?How should I use this medication? ?This medication is injected into a vein. It is given by your care team in a hospital or clinic setting. ?Talk to your care team about the use of this medication in children. While it may be prescribed for children as young as 6 years for selected conditions, precautions do apply. ?Overdosage: If you think you have taken too much of this medicine contact a poison control center or emergency room at once. ?NOTE: This medicine is only for you. Do not share this medicine with others. ?What if I miss a dose? ?It is important not to miss your dose. Call your care team if you are unable to keep an appointment. ?What may interact with this medication? ?Do not take this medication with any of the following: ?Deferasirox ?Deferoxamine ?Dimercaprol ?This medication may also interact with the following: ?Other iron products ?This list may not describe all possible interactions. Give your health care provider a list of all the medicines, herbs, non-prescription drugs, or dietary supplements you use. Also tell them if you smoke, drink alcohol, or use illegal drugs. Some items may interact with your medicine. ?What should I watch for while using this medication? ?Your condition will be monitored carefully while you are receiving this medication. ?Visit your care team for regular checks on your progress. You may need blood work while you are taking this medication. ?What side effects may I notice from receiving this medication? ?Side effects that you should report to your care team as soon as possible: ?Allergic reactions--skin rash, itching, hives, swelling of the face, lips, tongue, or throat ?Low blood pressure--dizziness, feeling  faint or lightheaded, blurry vision ?Shortness of breath ?Side effects that usually do not require medical attention (report to your care team if they continue or are bothersome): ?Flushing ?Headache ?Joint pain ?Muscle pain ?Nausea ?Pain, redness, or irritation at injection site ?This list may not describe all possible side effects. Call your doctor for medical advice about side effects. You may report side effects to FDA at 1-800-FDA-1088. ?Where should I keep my medication? ?This medication is given in a hospital or clinic and will not be stored at home. ?NOTE: This sheet is a summary. It may not cover all possible information. If you have questions about this medicine, talk to your doctor, pharmacist, or health care provider. ?? 2022 Elsevier/Gold Standard (2020-08-25 00:00:00) ? ? ?

## 2021-06-16 ENCOUNTER — Inpatient Hospital Stay: Payer: Medicaid Other

## 2021-06-20 ENCOUNTER — Inpatient Hospital Stay: Payer: Medicaid Other

## 2021-06-20 ENCOUNTER — Inpatient Hospital Stay: Payer: Medicaid Other | Admitting: Nurse Practitioner

## 2021-06-20 ENCOUNTER — Telehealth: Payer: Self-pay

## 2021-06-20 ENCOUNTER — Other Ambulatory Visit: Payer: Self-pay | Admitting: Internal Medicine

## 2021-06-20 DIAGNOSIS — I10 Essential (primary) hypertension: Secondary | ICD-10-CM

## 2021-06-20 NOTE — Telephone Encounter (Signed)
Refill Request- Pt requesting a call back about her dosage because seh is still not sleep well on her ALPRAZolam (XANAX) 0.5 MG tablet  ? ?amLODipine (NORVASC) 10 MG tablet ? ?hydrochlorothiazide (HYDRODIURIL) 12.5 MG tablet ? ? ? ?[facility] [phone] - [place], [place] (Ph: [phone]) ?

## 2021-06-20 NOTE — Telephone Encounter (Signed)
This nurse attempted to reach patient related to 9 am appointment for this morning.  This nurse unable to reach patient.  No further concerns at this time.   ?

## 2021-06-20 NOTE — Telephone Encounter (Signed)
Refill for HCTZ early. ?

## 2021-06-21 ENCOUNTER — Telehealth: Payer: Self-pay | Admitting: Nurse Practitioner

## 2021-06-21 NOTE — Telephone Encounter (Signed)
.  Called pt per 3/8 inbasket , Patient was unavailable, a message with appt time and date was left with number on file.   ?

## 2021-06-22 MED ORDER — AMLODIPINE BESYLATE 10 MG PO TABS: 10.0000 mg | ORAL_TABLET | Freq: Every day | ORAL | 3 refills | Status: DC

## 2021-06-23 ENCOUNTER — Other Ambulatory Visit: Payer: Self-pay | Admitting: Hematology

## 2021-06-25 NOTE — Telephone Encounter (Signed)
Call to patient to inform her that she has refills on the HCTZ and that the Amlodipine was refilled for her.   She will need to check with her Oncology doctor for refills on her Xanax.   Message was left on VM to contact the Clinics if she has questions. ?

## 2021-06-26 ENCOUNTER — Telehealth: Payer: Self-pay

## 2021-06-26 NOTE — Telephone Encounter (Signed)
Patient called regarding her rx for trazodone she stated the medication is not working and that it is hard for her to sleep at night. Patient is requesting a call back regarding the MG of the medication call [phone] ?

## 2021-06-26 NOTE — Telephone Encounter (Signed)
RTC to patient states the Trazadone is not working and has not worked for over a year.  Would like to try something else. ?

## 2021-06-27 ENCOUNTER — Other Ambulatory Visit: Payer: Self-pay

## 2021-06-27 ENCOUNTER — Other Ambulatory Visit: Payer: Medicaid Other

## 2021-06-27 ENCOUNTER — Ambulatory Visit: Payer: Medicaid Other

## 2021-06-27 NOTE — Telephone Encounter (Addendum)
Call to patient informed that the Clinics did not order the Trazadone for her and that she will need to speak with her Oncology doctor about the Trazodone or make an appointment to come to the Clinic to discuss her insomnia.  Patient said that she will talk with her Oncologist about the Trazodone first. ?

## 2021-06-28 ENCOUNTER — Inpatient Hospital Stay: Payer: Medicaid Other

## 2021-06-28 MED ORDER — SERTRALINE HCL 25 MG PO TABS: 125.0000 mg | ORAL_TABLET | Freq: Every day | ORAL | 2 refills | Status: DC

## 2021-06-29 ENCOUNTER — Inpatient Hospital Stay (HOSPITAL_BASED_OUTPATIENT_CLINIC_OR_DEPARTMENT_OTHER): Payer: Medicaid Other

## 2021-06-29 ENCOUNTER — Inpatient Hospital Stay: Payer: Medicaid Other

## 2021-06-29 ENCOUNTER — Other Ambulatory Visit: Payer: Self-pay

## 2021-06-29 VITALS — BP 143/78 | HR 64 | Temp 98.2°F | Resp 17 | Wt 240.5 lb

## 2021-06-29 DIAGNOSIS — Z95828 Presence of other vascular implants and grafts: Secondary | ICD-10-CM

## 2021-06-29 DIAGNOSIS — I780 Hereditary hemorrhagic telangiectasia: Secondary | ICD-10-CM

## 2021-06-29 DIAGNOSIS — D649 Anemia, unspecified: Secondary | ICD-10-CM

## 2021-06-29 DIAGNOSIS — Z5112 Encounter for antineoplastic immunotherapy: Secondary | ICD-10-CM | POA: Diagnosis not present

## 2021-06-29 DIAGNOSIS — D500 Iron deficiency anemia secondary to blood loss (chronic): Secondary | ICD-10-CM

## 2021-06-29 DIAGNOSIS — Q2733 Arteriovenous malformation of digestive system vessel: Secondary | ICD-10-CM

## 2021-06-29 LAB — CMP (CANCER CENTER ONLY)
ALT: 5 U/L (ref 0–44)
AST: 13 U/L — ABNORMAL LOW (ref 15–41)
Albumin: 4.1 g/dL (ref 3.5–5.0)
Alkaline Phosphatase: 66 U/L (ref 38–126)
Anion gap: 9 (ref 5–15)
BUN: 16 mg/dL (ref 6–20)
CO2: 26 mmol/L (ref 22–32)
Calcium: 9.7 mg/dL (ref 8.9–10.3)
Chloride: 104 mmol/L (ref 98–111)
Creatinine: 1.08 mg/dL — ABNORMAL HIGH (ref 0.44–1.00)
GFR, Estimated: 59 mL/min — ABNORMAL LOW (ref >60)
Glucose, Bld: 94 mg/dL (ref 70–99)
Potassium: 3.4 mmol/L — ABNORMAL LOW (ref 3.5–5.1)
Sodium: 139 mmol/L (ref 135–145)
Total Bilirubin: 0.3 mg/dL (ref 0.3–1.2)
Total Protein: 7.9 g/dL (ref 6.5–8.1)

## 2021-06-29 LAB — CBC WITH DIFFERENTIAL (CANCER CENTER ONLY)
Abs Immature Granulocytes: 0.06 10*3/uL (ref 0.00–0.07)
Basophils Absolute: 0 10*3/uL (ref 0.0–0.1)
Basophils Relative: 0 %
Eosinophils Absolute: 0.1 10*3/uL (ref 0.0–0.5)
Eosinophils Relative: 1 %
HCT: 31.9 % — ABNORMAL LOW (ref 36.0–46.0)
Hemoglobin: 9.4 g/dL — ABNORMAL LOW (ref 12.0–15.0)
Immature Granulocytes: 1 %
Lymphocytes Relative: 12 %
Lymphs Abs: 1.4 10*3/uL (ref 0.7–4.0)
MCH: 24.5 pg — ABNORMAL LOW (ref 26.0–34.0)
MCHC: 29.5 g/dL — ABNORMAL LOW (ref 30.0–36.0)
MCV: 83.3 fL (ref 80.0–100.0)
Monocytes Absolute: 0.6 10*3/uL (ref 0.1–1.0)
Monocytes Relative: 5 %
Neutro Abs: 9 10*3/uL — ABNORMAL HIGH (ref 1.7–7.7)
Neutrophils Relative %: 81 %
Platelet Count: 269 10*3/uL (ref 150–400)
RBC: 3.83 MIL/uL — ABNORMAL LOW (ref 3.87–5.11)
RDW: 22.9 % — ABNORMAL HIGH (ref 11.5–15.5)
WBC Count: 11.2 10*3/uL — ABNORMAL HIGH (ref 4.0–10.5)
nRBC: 0 % (ref 0.0–0.2)

## 2021-06-29 LAB — SAMPLE TO BLOOD BANK

## 2021-06-29 LAB — FERRITIN: Ferritin: 104 ng/mL (ref 11–307)

## 2021-06-29 MED ORDER — SODIUM CHLORIDE 0.9 % IV SOLN: Freq: Once | INTRAVENOUS | Status: AC

## 2021-06-29 MED ORDER — HEPARIN SOD (PORK) LOCK FLUSH 100 UNIT/ML IV SOLN
500.0000 [IU] | Freq: Once | INTRAVENOUS | Status: AC | PRN
  Administered 2021-06-29: 500 [IU]

## 2021-06-29 MED ORDER — SODIUM CHLORIDE 0.9% FLUSH
10.0000 mL | INTRAVENOUS | Status: DC | PRN
  Administered 2021-06-29: 10 mL via INTRAVENOUS

## 2021-06-29 MED ORDER — SODIUM CHLORIDE 0.9 % IV SOLN
250.0000 mg | Freq: Once | INTRAVENOUS | Status: AC
  Administered 2021-06-29: 250 mg via INTRAVENOUS
  Filled 2021-06-29: qty 20

## 2021-06-29 MED ORDER — SODIUM CHLORIDE 0.9 % IV SOLN
5.0000 mg/kg | Freq: Once | INTRAVENOUS | Status: AC
  Administered 2021-06-29: 500 mg via INTRAVENOUS
  Filled 2021-06-29: qty 4

## 2021-06-29 MED ORDER — SODIUM CHLORIDE 0.9% FLUSH
10.0000 mL | INTRAVENOUS | Status: DC | PRN
  Administered 2021-06-29: 10 mL

## 2021-06-29 NOTE — Patient Instructions (Signed)
[facility]  Discharge Instructions: ?Thank you for choosing [facility] to provide your oncology and hematology care.  ? ?If you have a lab appointment with the [facility], please go directly to the [facility] and check in at the registration area. ?  ?Wear comfortable clothing and clothing appropriate for easy access to any Portacath or PICC line.  ? ?We strive to give you quality time with your provider. You may need to reschedule your appointment if you arrive late (15 or more minutes).  Arriving late affects you and other patients whose appointments are after yours.  Also, if you miss three or more appointments without notifying the office, you may be dismissed from the clinic at the provider?s discretion.    ?  ?For prescription refill requests, have your pharmacy contact our office and allow 72 hours for refills to be completed.   ? ?Today you received the following chemotherapy and/or immunotherapy agents: Bevacizumab (Avastin) & Ferrlecit ?  ?To help prevent nausea and vomiting after your treatment, we encourage you to take your nausea medication as directed. ? ?BELOW ARE SYMPTOMS THAT SHOULD BE REPORTED IMMEDIATELY: ?*FEVER GREATER THAN 100.4 F (38 ?C) OR HIGHER ?*CHILLS OR SWEATING ?*NAUSEA AND VOMITING THAT IS NOT CONTROLLED WITH YOUR NAUSEA MEDICATION ?*UNUSUAL SHORTNESS OF BREATH ?*UNUSUAL BRUISING OR BLEEDING ?*URINARY PROBLEMS (pain or burning when urinating, or frequent urination) ?*BOWEL PROBLEMS (unusual diarrhea, constipation, pain near the anus) ?TENDERNESS IN MOUTH AND THROAT WITH OR WITHOUT PRESENCE OF ULCERS (sore throat, sores in mouth, or a toothache) ?UNUSUAL RASH, SWELLING OR PAIN  ?UNUSUAL VAGINAL DISCHARGE OR ITCHING  ? ?Items with * indicate a potential emergency and should be followed up as soon as possible or go to the Emergency Department if any problems should occur. ? ?Please show the CHEMOTHERAPY ALERT CARD or IMMUNOTHERAPY ALERT  CARD at check-in to the Emergency Department and triage nurse. ? ?Should you have questions after your visit or need to cancel or reschedule your appointment, please contact [facility]  Dept: [phone]  and follow the prompts.  Office hours are 8:00 a.m. to 4:30 p.m. Monday - Friday. Please note that voicemails left after 4:00 p.m. may not be returned until the following business day.  We are closed weekends and major holidays. You have access to a nurse at all times for urgent questions. Please call the main number to the clinic Dept: [phone] and follow the prompts. ? ? ?For any non-urgent questions, you may also contact your provider using MyChart. We now offer e-Visits for anyone 72 and older to request care online for non-urgent symptoms. For details visit [email or web address]. ?  ?Also download the MyChart app! Go to the app store, search "MyChart", open the app, select [facility], and log in with your MyChart username and password. ? ?Due to Covid, a mask is required upon entering the hospital/clinic. If you do not have a mask, one will be given to you upon arrival. For doctor visits, patients may have 1 support Peggy House aged 61 or older with them. For treatment visits, patients cannot have anyone with them due to current Covid guidelines and our immunocompromised population.  ? ? ?Sodium Ferric Gluconate Complex Injection ?What is this medication? ?SODIUM FERRIC GLUCONATE COMPLEX (SOE dee um FER ik GLOO koe nate KOM pleks) treats low levels of iron (iron deficiency anemia) in people with kidney disease. Iron is a mineral that plays an important role in making red  blood cells, which carry oxygen from your lungs to the rest of your body. ?This medicine may be used for other purposes; ask your health care provider or pharmacist if you have questions. ?COMMON BRAND NAME(S): Ferrlecit, Nulecit ?What should I tell my care team before I take this medication? ?They need to  know if you have any of the following conditions: ?Anemia that is not from iron deficiency ?High levels of iron in the blood ?An unusual or allergic reaction to iron, other medications, foods, dyes, or preservatives ?Pregnant or are trying to become pregnant ?Breast-feeding ?How should I use this medication? ?This medication is injected into a vein. It is given by your care team in a hospital or clinic setting. ?Talk to your care team about the use of this medication in children. While it may be prescribed for children as young as 6 years for selected conditions, precautions do apply. ?Overdosage: If you think you have taken too much of this medicine contact a poison control center or emergency room at once. ?NOTE: This medicine is only for you. Do not share this medicine with others. ?What if I miss a dose? ?It is important not to miss your dose. Call your care team if you are unable to keep an appointment. ?What may interact with this medication? ?Do not take this medication with any of the following: ?Deferasirox ?Deferoxamine ?Dimercaprol ?This medication may also interact with the following: ?Other iron products ?This list may not describe all possible interactions. Give your health care provider a list of all the medicines, herbs, non-prescription drugs, or dietary supplements you use. Also tell them if you smoke, drink alcohol, or use illegal drugs. Some items may interact with your medicine. ?What should I watch for while using this medication? ?Your condition will be monitored carefully while you are receiving this medication. ?Visit your care team for regular checks on your progress. You may need blood work while you are taking this medication. ?What side effects may I notice from receiving this medication? ?Side effects that you should report to your care team as soon as possible: ?Allergic reactions--skin rash, itching, hives, swelling of the face, lips, tongue, or throat ?Low blood pressure--dizziness,  feeling faint or lightheaded, blurry vision ?Shortness of breath ?Side effects that usually do not require medical attention (report to your care team if they continue or are bothersome): ?Flushing ?Headache ?Joint pain ?Muscle pain ?Nausea ?Pain, redness, or irritation at injection site ?This list may not describe all possible side effects. Call your doctor for medical advice about side effects. You may report side effects to FDA at 1-800-FDA-1088. ?Where should I keep my medication? ?This medication is given in a hospital or clinic and will not be stored at home. ?NOTE: This sheet is a summary. It may not cover all possible information. If you have questions about this medicine, talk to your doctor, pharmacist, or health care provider. ?? 2022 Elsevier/Gold Standard (2020-08-25 00:00:00) ? ? ?

## 2021-06-30 ENCOUNTER — Inpatient Hospital Stay: Payer: Medicaid Other

## 2021-07-03 ENCOUNTER — Telehealth: Payer: Self-pay

## 2021-07-03 NOTE — Telephone Encounter (Signed)
sertraline (ZOLOFT) 25 MG tablet, REFILL REQUEST.  ?

## 2021-07-03 NOTE — Telephone Encounter (Signed)
RTC to patient after call to [facility]ft is at the patient's pharmacy awaiting pick up.  Patient was informed and will get medication. ?

## 2021-07-04 ENCOUNTER — Ambulatory Visit: Payer: Medicaid Other

## 2021-07-04 ENCOUNTER — Other Ambulatory Visit: Payer: Medicaid Other

## 2021-07-05 ENCOUNTER — Inpatient Hospital Stay: Payer: Medicaid Other

## 2021-07-05 ENCOUNTER — Other Ambulatory Visit: Payer: Self-pay

## 2021-07-05 ENCOUNTER — Encounter: Payer: Self-pay | Admitting: Licensed Clinical Social Worker

## 2021-07-05 VITALS — BP 154/74 | HR 65 | Temp 98.4°F | Resp 16

## 2021-07-05 DIAGNOSIS — Z95828 Presence of other vascular implants and grafts: Secondary | ICD-10-CM

## 2021-07-05 DIAGNOSIS — Z5112 Encounter for antineoplastic immunotherapy: Secondary | ICD-10-CM | POA: Diagnosis not present

## 2021-07-05 DIAGNOSIS — D500 Iron deficiency anemia secondary to blood loss (chronic): Secondary | ICD-10-CM

## 2021-07-05 DIAGNOSIS — D649 Anemia, unspecified: Secondary | ICD-10-CM

## 2021-07-05 LAB — CBC WITH DIFFERENTIAL (CANCER CENTER ONLY)
Abs Immature Granulocytes: 0.03 10*3/uL (ref 0.00–0.07)
Basophils Absolute: 0.1 10*3/uL (ref 0.0–0.1)
Basophils Relative: 1 %
Eosinophils Absolute: 0.1 10*3/uL (ref 0.0–0.5)
Eosinophils Relative: 1 %
HCT: 32.1 % — ABNORMAL LOW (ref 36.0–46.0)
Hemoglobin: 9.7 g/dL — ABNORMAL LOW (ref 12.0–15.0)
Immature Granulocytes: 0 %
Lymphocytes Relative: 16 %
Lymphs Abs: 1.9 10*3/uL (ref 0.7–4.0)
MCH: 25.4 pg — ABNORMAL LOW (ref 26.0–34.0)
MCHC: 30.2 g/dL (ref 30.0–36.0)
MCV: 84 fL (ref 80.0–100.0)
Monocytes Absolute: 0.6 10*3/uL (ref 0.1–1.0)
Monocytes Relative: 5 %
Neutro Abs: 9.4 10*3/uL — ABNORMAL HIGH (ref 1.7–7.7)
Neutrophils Relative %: 77 %
Platelet Count: 284 10*3/uL (ref 150–400)
RBC: 3.82 MIL/uL — ABNORMAL LOW (ref 3.87–5.11)
RDW: 23.1 % — ABNORMAL HIGH (ref 11.5–15.5)
WBC Count: 12 10*3/uL — ABNORMAL HIGH (ref 4.0–10.5)
nRBC: 0 % (ref 0.0–0.2)

## 2021-07-05 LAB — SAMPLE TO BLOOD BANK

## 2021-07-05 MED ORDER — SODIUM CHLORIDE 0.9% FLUSH
10.0000 mL | Freq: Once | INTRAVENOUS | Status: AC
  Administered 2021-07-05: 10 mL

## 2021-07-05 MED ORDER — SODIUM CHLORIDE 0.9 % IV SOLN
250.0000 mg | Freq: Once | INTRAVENOUS | Status: AC
  Administered 2021-07-05: 250 mg via INTRAVENOUS
  Filled 2021-07-05: qty 20

## 2021-07-05 MED ORDER — HEPARIN SOD (PORK) LOCK FLUSH 100 UNIT/ML IV SOLN
500.0000 [IU] | Freq: Once | INTRAVENOUS | Status: AC
  Administered 2021-07-05: 500 [IU] via INTRAVENOUS

## 2021-07-05 MED ORDER — SODIUM CHLORIDE 0.9 % IV SOLN: Freq: Once | INTRAVENOUS | Status: AC

## 2021-07-05 MED ORDER — SODIUM CHLORIDE 0.9% FLUSH
10.0000 mL | Freq: Once | INTRAVENOUS | Status: AC
  Administered 2021-07-05: 10 mL via INTRAVENOUS

## 2021-07-05 NOTE — Progress Notes (Signed)
[facility] CSW Progress Note ? ?Clinical Social Worker contacted patient by phone to discuss concerns related to arriving on time for appointments.  Pt states she resides w/ her 2 sons who provide transportation to her appointments; however, one is currently working the 3rd shift and the other is getting ready to start working the 3rd shift.  Unfortunately, at the time pt needs to arrive at her appointment her sons may be sleeping and it is difficult to make it on time.  CSW inquired if pt has Medicaid transportation benefits.  Pt states she had transportation benefits at one time, but is unsure if she still has the benefits.  CSW contacted Medicaid and pt's transportation benefits were verified.  Pt verbalized agreement w/ Probation officer booking transportation for appointments at [facility] on behalf of pt to ensure consistent transportation is available for the next month.  Pt requested CSW call pt one day before appointments to remind of pick up time to which CSW agreed.  CSW booked transport for all appointments through April 20th and provided pt with a list of pick up times, confirmation numbers, as well as the number to call to change appointment times if needed or book transportation for other medical appointments.  Pt also informed writer her zoloft refill was not at the pharmacy and she has not taken this medication for the past three weeks.  Writer contacted pt's PCP who refilled the prescription and pt was notified it was ready for pick up.  CSW to continue to provide support as appropriate.   ? ? ? ?Henriette Combs , LCSW ?

## 2021-07-05 NOTE — Progress Notes (Signed)
Pt declined to stay for 30 minute post Ferrlecit inf observation.  ?Pt tolerated trtmt well w/out incident.  ?VSS at discharge.  ?W/C assist to lobby.  ?

## 2021-07-05 NOTE — Patient Instructions (Signed)
Sodium Ferric Gluconate Complex Injection ?What is this medication? ?SODIUM FERRIC GLUCONATE COMPLEX (SOE dee um FER ik GLOO koe nate KOM pleks) treats low levels of iron (iron deficiency anemia) in people with kidney disease. Iron is a mineral that plays an important role in making red blood cells, which carry oxygen from your lungs to the rest of your body. ?This medicine may be used for other purposes; ask your health care provider or pharmacist if you have questions. ?COMMON BRAND NAME(S): Ferrlecit, Nulecit ?What should I tell my care team before I take this medication? ?They need to know if you have any of the following conditions: ?Anemia that is not from iron deficiency ?High levels of iron in the blood ?An unusual or allergic reaction to iron, other medications, foods, dyes, or preservatives ?Pregnant or are trying to become pregnant ?Breast-feeding ?How should I use this medication? ?This medication is injected into a vein. It is given by your care team in a hospital or clinic setting. ?Talk to your care team about the use of this medication in children. While it may be prescribed for children as young as 6 years for selected conditions, precautions do apply. ?Overdosage: If you think you have taken too much of this medicine contact a poison control center or emergency room at once. ?NOTE: This medicine is only for you. Do not share this medicine with others. ?What if I miss a dose? ?It is important not to miss your dose. Call your care team if you are unable to keep an appointment. ?What may interact with this medication? ?Do not take this medication with any of the following: ?Deferasirox ?Deferoxamine ?Dimercaprol ?This medication may also interact with the following: ?Other iron products ?This list may not describe all possible interactions. Give your health care provider a list of all the medicines, herbs, non-prescription drugs, or dietary supplements you use. Also tell them if you smoke, drink  alcohol, or use illegal drugs. Some items may interact with your medicine. ?What should I watch for while using this medication? ?Your condition will be monitored carefully while you are receiving this medication. ?Visit your care team for regular checks on your progress. You may need blood work while you are taking this medication. ?What side effects may I notice from receiving this medication? ?Side effects that you should report to your care team as soon as possible: ?Allergic reactions--skin rash, itching, hives, swelling of the face, lips, tongue, or throat ?Low blood pressure--dizziness, feeling faint or lightheaded, blurry vision ?Shortness of breath ?Side effects that usually do not require medical attention (report to your care team if they continue or are bothersome): ?Flushing ?Headache ?Joint pain ?Muscle pain ?Nausea ?Pain, redness, or irritation at injection site ?This list may not describe all possible side effects. Call your doctor for medical advice about side effects. You may report side effects to FDA at 1-800-FDA-1088. ?Where should I keep my medication? ?This medication is given in a hospital or clinic and will not be stored at home. ?NOTE: This sheet is a summary. It may not cover all possible information. If you have questions about this medicine, talk to your doctor, pharmacist, or health care provider. ?? 2022 Elsevier/Gold Standard (2020-08-25 00:00:00) ? ?

## 2021-07-09 ENCOUNTER — Ambulatory Visit: Payer: Medicaid Other | Admitting: Behavioral Health

## 2021-07-09 DIAGNOSIS — F419 Anxiety disorder, unspecified: Secondary | ICD-10-CM

## 2021-07-09 DIAGNOSIS — F331 Major depressive disorder, recurrent, moderate: Secondary | ICD-10-CM

## 2021-07-09 NOTE — BH Specialist Note (Addendum)
Integrated Behavioral Health via Telemedicine Visit ? ?07/09/2021 ?Peggy House ?[id number] ? ?Number of [facility] Clinician visits: 13 ?Session Start time: 1330 ?Session End time: 1400 ?Total time in minutes: 30 min ? ?Referring Provider: Dr. Lily Kocher, MD ?Patient/Family location: Pt is home in private ?[facility] Provider location: [facility] Office ?All persons participating in visit: Pt & Clinician ?Types of Service: Individual psychotherapy ? ?I connected with Peggy House and/or Peggy House's  self  via  Telephone or Video Enabled Telemedicine Application  (Video is Caregility application) and verified that I am speaking with the correct person using two identifiers. Discussed confidentiality: Yes  ? ?I discussed the limitations of telemedicine and the availability of in person appointments.  Discussed there is a possibility of technology failure and discussed alternative modes of communication if that failure occurs. ? ?I discussed that engaging in this telemedicine visit, they consent to the provision of behavioral healthcare and the services will be billed under their insurance. ? ?Patient and/or legal guardian expressed understanding and consented to Telemedicine visit: Yes  ? ?Presenting Concerns: ?Patient and/or family reports the following symptoms/concerns: still not exp'g restorative or lasting sleep; does not sleep until after 2pm ?Duration of problem: yrs; Severity of problem: mild ? ?Patient and/or Family's Strengths/Protective Factors: ?Social and Emotional competence, Concrete supports in place (healthy food, safe environments, etc.), and Sense of purpose ? ?Goals Addressed: ?Patient will: ? Reduce symptoms of: anxiety and depression  ? Increase knowledge and/or ability of: coping skills, healthy habits, and stress reduction  ? Demonstrate ability to: Increase healthy adjustment to current life circumstances ? ?Progress towards  Goals: ?Ongoing ? ?Interventions: ?Interventions utilized:  Supportive Counseling ?Standardized Assessments completed:  screeners prn ? ?Try writing down worries in a Notebook & put aside so you can initiate sleep earlier than 2:00am. Download some Bible apps so you can obtain audio for listening.  ? ?Patient and/or Family Response: Pt receptive to visit today a bit early. She requests future ck-in visits.  ? ?Assessment: ?Patient currently experiencing her daily routine of chores & cook sometimes for Sons.  ? ?Pt's 2 grown Sons & one Son's GF stay in residence. GF cooks for them & both Sons work tgthr. ? ?Patient may benefit from cont'd ck-in sessions for grief work. ? ?Plan: ?Follow up with behavioral health clinician on : 2-3 wks for 30 min telehealth session ?Behavioral recommendations: f/u with all health appts ?Referral(s): [facility] (In Clinic) ? ?I discussed the assessment and treatment plan with the patient and/or parent/guardian. They were provided an opportunity to ask questions and all were answered. They agreed with the plan and demonstrated an understanding of the instructions. ?  ?They were advised to call back or seek an in-person evaluation if the symptoms worsen or if the condition fails to improve as anticipated. ? ?[name], LMFT ?

## 2021-07-11 ENCOUNTER — Telehealth: Payer: Self-pay

## 2021-07-11 NOTE — Telephone Encounter (Signed)
Spoke with pt via telephone regarding her appointments scheduled on 07/12/2021.  Informed pt that she has a lab appt at 11:30 am, f/u w/Dr. Burr Medico, and infusion.  Pt confirmed appts for tomorrow.  Contact [facility] in registration to confirm transportation for 07/12/2021.  Danae Chen stated pt is setup for hospital transportation but does not use it because she uses [employer]. ?

## 2021-07-12 ENCOUNTER — Inpatient Hospital Stay: Payer: Medicaid Other

## 2021-07-12 ENCOUNTER — Encounter: Payer: Self-pay | Admitting: Hematology

## 2021-07-12 ENCOUNTER — Other Ambulatory Visit: Payer: Self-pay | Admitting: *Deleted

## 2021-07-12 ENCOUNTER — Inpatient Hospital Stay (HOSPITAL_BASED_OUTPATIENT_CLINIC_OR_DEPARTMENT_OTHER): Payer: Medicaid Other | Admitting: Hematology

## 2021-07-12 ENCOUNTER — Other Ambulatory Visit: Payer: Self-pay

## 2021-07-12 VITALS — BP 157/67 | HR 52 | Temp 98.4°F | Resp 18 | Ht 65.5 in | Wt 236.3 lb

## 2021-07-12 VITALS — BP 169/71 | HR 51 | Temp 97.7°F | Resp 18

## 2021-07-12 DIAGNOSIS — Z95828 Presence of other vascular implants and grafts: Secondary | ICD-10-CM

## 2021-07-12 DIAGNOSIS — I780 Hereditary hemorrhagic telangiectasia: Secondary | ICD-10-CM

## 2021-07-12 DIAGNOSIS — D649 Anemia, unspecified: Secondary | ICD-10-CM

## 2021-07-12 DIAGNOSIS — Z5112 Encounter for antineoplastic immunotherapy: Secondary | ICD-10-CM | POA: Diagnosis not present

## 2021-07-12 DIAGNOSIS — D500 Iron deficiency anemia secondary to blood loss (chronic): Secondary | ICD-10-CM | POA: Diagnosis not present

## 2021-07-12 DIAGNOSIS — Q2733 Arteriovenous malformation of digestive system vessel: Secondary | ICD-10-CM

## 2021-07-12 LAB — CBC WITH DIFFERENTIAL (CANCER CENTER ONLY)
Abs Immature Granulocytes: 0.04 10*3/uL (ref 0.00–0.07)
Basophils Absolute: 0.1 10*3/uL (ref 0.0–0.1)
Basophils Relative: 1 %
Eosinophils Absolute: 0.2 10*3/uL (ref 0.0–0.5)
Eosinophils Relative: 2 %
HCT: 33.6 % — ABNORMAL LOW (ref 36.0–46.0)
Hemoglobin: 10.1 g/dL — ABNORMAL LOW (ref 12.0–15.0)
Immature Granulocytes: 0 %
Lymphocytes Relative: 13 %
Lymphs Abs: 1.4 10*3/uL (ref 0.7–4.0)
MCH: 25.6 pg — ABNORMAL LOW (ref 26.0–34.0)
MCHC: 30.1 g/dL (ref 30.0–36.0)
MCV: 85.3 fL (ref 80.0–100.0)
Monocytes Absolute: 0.5 10*3/uL (ref 0.1–1.0)
Monocytes Relative: 5 %
Neutro Abs: 8.8 10*3/uL — ABNORMAL HIGH (ref 1.7–7.7)
Neutrophils Relative %: 79 %
Platelet Count: 268 10*3/uL (ref 150–400)
RBC: 3.94 MIL/uL (ref 3.87–5.11)
RDW: 22.9 % — ABNORMAL HIGH (ref 11.5–15.5)
WBC Count: 11 10*3/uL — ABNORMAL HIGH (ref 4.0–10.5)
nRBC: 0 % (ref 0.0–0.2)

## 2021-07-12 LAB — SAMPLE TO BLOOD BANK

## 2021-07-12 LAB — TOTAL PROTEIN, URINE DIPSTICK: Protein, ur: 100 mg/dL — AB

## 2021-07-12 LAB — FERRITIN: Ferritin: 181 ng/mL (ref 11–307)

## 2021-07-12 MED ORDER — SODIUM CHLORIDE 0.9% FLUSH
10.0000 mL | Freq: Once | INTRAVENOUS | Status: AC
  Administered 2021-07-12: 10 mL

## 2021-07-12 MED ORDER — SODIUM CHLORIDE 0.9 % IV SOLN: Freq: Once | INTRAVENOUS | Status: DC

## 2021-07-12 MED ORDER — SODIUM CHLORIDE 0.9 % IV SOLN
5.0000 mg/kg | Freq: Once | INTRAVENOUS | Status: AC
  Administered 2021-07-12: 500 mg via INTRAVENOUS
  Filled 2021-07-12: qty 4

## 2021-07-12 MED ORDER — SODIUM CHLORIDE 0.9 % IV SOLN
250.0000 mg | Freq: Once | INTRAVENOUS | Status: AC
  Administered 2021-07-12: 250 mg via INTRAVENOUS
  Filled 2021-07-12: qty 20

## 2021-07-12 MED ORDER — SODIUM CHLORIDE 0.9 % IV SOLN: Freq: Once | INTRAVENOUS | Status: AC

## 2021-07-12 MED ORDER — HEPARIN SOD (PORK) LOCK FLUSH 100 UNIT/ML IV SOLN
500.0000 [IU] | Freq: Once | INTRAVENOUS | Status: AC
  Administered 2021-07-12: 500 [IU]

## 2021-07-12 NOTE — Patient Instructions (Addendum)
[facility]  Discharge Instructions: ?Thank you for choosing [facility] to provide your oncology and hematology care.  ? ?If you have a lab appointment with the [facility], please go directly to the [facility] and check in at the registration area. ?  ?Wear comfortable clothing and clothing appropriate for easy access to any Portacath or PICC line.  ? ?We strive to give you quality time with your provider. You may need to reschedule your appointment if you arrive late (15 or more minutes).  Arriving late affects you and other patients whose appointments are after yours.  Also, if you miss three or more appointments without notifying the office, you may be dismissed from the clinic at the provider?s discretion.    ?  ?For prescription refill requests, have your pharmacy contact our office and allow 72 hours for refills to be completed.   ? ?Today you received the following chemotherapy and/or immunotherapy agent: Zirabev, iron infusion    ?  ?To help prevent nausea and vomiting after your treatment, we encourage you to take your nausea medication as directed. ? ?BELOW ARE SYMPTOMS THAT SHOULD BE REPORTED IMMEDIATELY: ?*FEVER GREATER THAN 100.4 F (38 ?C) OR HIGHER ?*CHILLS OR SWEATING ?*NAUSEA AND VOMITING THAT IS NOT CONTROLLED WITH YOUR NAUSEA MEDICATION ?*UNUSUAL SHORTNESS OF BREATH ?*UNUSUAL BRUISING OR BLEEDING ?*URINARY PROBLEMS (pain or burning when urinating, or frequent urination) ?*BOWEL PROBLEMS (unusual diarrhea, constipation, pain near the anus) ?TENDERNESS IN MOUTH AND THROAT WITH OR WITHOUT PRESENCE OF ULCERS (sore throat, sores in mouth, or a toothache) ?UNUSUAL RASH, SWELLING OR PAIN  ?UNUSUAL VAGINAL DISCHARGE OR ITCHING  ? ?Items with * indicate a potential emergency and should be followed up as soon as possible or go to the Emergency Department if any problems should occur. ? ?Please show the CHEMOTHERAPY ALERT CARD or IMMUNOTHERAPY ALERT CARD at  check-in to the Emergency Department and triage nurse. ? ?Should you have questions after your visit or need to cancel or reschedule your appointment, please contact [facility]  Dept: [phone]  and follow the prompts.  Office hours are 8:00 a.m. to 4:30 p.m. Monday - Friday. Please note that voicemails left after 4:00 p.m. may not be returned until the following business day.  We are closed weekends and major holidays. You have access to a nurse at all times for urgent questions. Please call the main number to the clinic Dept: [phone] and follow the prompts. ? ? ?For any non-urgent questions, you may also contact your provider using MyChart. We now offer e-Visits for anyone 69 and older to request care online for non-urgent symptoms. For details visit [email or web address]. ?  ?Also download the MyChart app! Go to the app store, search "MyChart", open the app, select [facility], and log in with your MyChart username and password. ? ?Due to Covid, a mask is required upon entering the hospital/clinic. If you do not have a mask, one will be given to you upon arrival. For doctor visits, patients may have 1 support person aged 33 or older with them. For treatment visits, patients cannot have anyone with them due to current Covid guidelines and our immunocompromised population.  ? ?Iron Sucrose Injection ?What is this medication? ?IRON SUCROSE (EYE ern SOO krose) treats low levels of iron (iron deficiency anemia) in people with kidney disease. Iron is a mineral that plays an important role in making red blood cells, which carry oxygen from your lungs to  the rest of your body. ?This medicine may be used for other purposes; ask your health care provider or pharmacist if you have questions. ?COMMON BRAND NAME(S): Venofer, Ferrlecit ?What should I tell my care team before I take this medication? ?They need to know if you have any of these conditions: ?Anemia not caused by low iron  levels ?Heart disease ?High levels of iron in the blood ?Kidney disease ?Liver disease ?An unusual or allergic reaction to iron, other medications, foods, dyes, or preservatives ?Pregnant or trying to get pregnant ?Breast-feeding ?How should I use this medication? ?This medication is for infusion into a vein. It is given in a hospital or clinic setting. ?Talk to your care team about the use of this medication in children. While this medication may be prescribed for children as young as 2 years for selected conditions, precautions do apply. ?Overdosage: If you think you have taken too much of this medicine contact a poison control center or emergency room at once. ?NOTE: This medicine is only for you. Do not share this medicine with others. ?What if I miss a dose? ?It is important not to miss your dose. Call your care team if you are unable to keep an appointment. ?What may interact with this medication? ?Do not take this medication with any of the following: ?Deferoxamine ?Dimercaprol ?Other iron products ?This medication may also interact with the following: ?Chloramphenicol ?Deferasirox ?This list may not describe all possible interactions. Give your health care provider a list of all the medicines, herbs, non-prescription drugs, or dietary supplements you use. Also tell them if you smoke, drink alcohol, or use illegal drugs. Some items may interact with your medicine. ?What should I watch for while using this medication? ?Visit your care team regularly. Tell your care team if your symptoms do not start to get better or if they get worse. You may need blood work done while you are taking this medication. ?You may need to follow a special diet. Talk to your care team. Foods that contain iron include: whole grains/cereals, dried fruits, beans, or peas, leafy green vegetables, and organ meats (liver, kidney). ?What side effects may I notice from receiving this medication? ?Side effects that you should report to your  care team as soon as possible: ?Allergic reactions--skin rash, itching, hives, swelling of the face, lips, tongue, or throat ?Low blood pressure--dizziness, feeling faint or lightheaded, blurry vision ?Shortness of breath ?Side effects that usually do not require medical attention (report to your care team if they continue or are bothersome): ?Flushing ?Headache ?Joint pain ?Muscle pain ?Nausea ?Pain, redness, or irritation at injection site ?This list may not describe all possible side effects. Call your doctor for medical advice about side effects. You may report side effects to FDA at 1-800-FDA-1088. ?Where should I keep my medication? ?This medication is given in a hospital or clinic and will not be stored at home. ?NOTE: This sheet is a summary. It may not cover all possible information. If you have questions about this medicine, talk to your doctor, pharmacist, or health care provider. ?? 2022 Elsevier/Gold Standard (2020-08-25 00:00:00) ? ?

## 2021-07-12 NOTE — Progress Notes (Signed)
?[facility]   ?[phone] [phone]   ?Clinic Follow up Note  ? ?Patient Care Team: ?[name], MD as PCP - General ? ?Date of Service:  07/12/2021 ? ?CHIEF COMPLAINT: f/u of HHT and anemia ? ?CURRENT THERAPY:  ?-Blood transfusion for Hg<8.0 (2units on different days if Hg<7) ?-iv ferric gluconate every week, with ferritin goal 100-200.  ?-Avastin/Zirabev restarted on 04/03/18. Currently given PRN due to proteinuria. ?-Amicar 1g, intermittently since 07/17/18. Currently TID since 07/07/20. ? ?ASSESSMENT & PLAN:  ?Peggy House is a 61 y.o. female with  ? 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 ?-Pt was hospitalized several times from 1-10/2017 for severe recurrent GI bleeding from AVMs which required multiple blood transfusions and APCs. 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  ?-She is s/p IR embolization of the left Gastric and short gastric artery branch vessels on 12/10/17.  ?-She required cauterization of stomach for severe Gi bleeding in 11/2018, and epinephrine injection for bleeding ulcer on 11/03/20 ?-she continues on Amicar TID. ?-She has been on bevacizumab every 2-4 week, treatment has been intermittently held due to large proteinuria and missed appointments.  ?-she reports continued epistaxis about once every few weeks. ?-labs reviewed, hgb is up to 10.1 today. She does not need blood transfusion this week. She has been coming more consistently lately and her anemia has been better controlled with treatment  ?  ?2. 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  ?  ?3. Smoking cessation ?-She was smoking 10 cigarettes a day on average before recent hospitalization. She is smoking about 1/2 ppd (as of 08/03/20) ?-I strongly encouraged her to stop smoking completely. She tried previously but had trouble. ?  ?4. HTN, Recently diagnosed DM, Anxiety/Depression ?-Continue medications. Will monitor on Avastin.   ?-Continue to f/u with PCP and behavioral health. ?  ?  ?PLAN:  ?-proceed with IV iron and beva today ?-Lab and iv ferric gluconate '250mg'$  weekly x6 ?-Beva every 2 weeks, with urine protein every 4 weeks  ?-F/u in 6 weeks  ? ? ?No problem-specific Assessment & Plan notes found for this encounter. ? ? ?INTERVAL HISTORY:  ?Peggy House is here for a follow up of HHT and anemia. She was last seen by me on 05/10/21. She presents to the clinic alone. ?She reports she is doing well overall. She notes she still has occasional bleeding, but overall not much lately. She looks happier today. ?She notes she sometimes doesn't have an appetite. ?  ?All other systems were reviewed with the patient and are negative. ? ?MEDICAL HISTORY:  ?Past Medical History:  ?Diagnosis Date  ? Anxiety   ? Arthritis   ? knnes,back  ? GERD (gastroesophageal reflux disease)   ? Hereditary hemorrhagic telangiectasia ([place])   ? History of swelling of feet   ? Hyperlipidemia   ? Hypertension   ? Major depressive disorder, recurrent episode ([place]) 06/05/2015  ? Obesity   ? Snores   ? 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 ? ?  SURGICAL HISTORY: ?Past Surgical History:  ?Procedure Laterality Date  ? ABDOMINAL HYSTERECTOMY    ? CARPAL TUNNEL RELEASE  05/13/2011  ? Procedure: CARPAL TUNNEL RELEASE;  Surgeon: Nita Sells, MD;  Location: [facility];  Service: Orthopedics;  Laterality: Left;  ? COLONOSCOPY N/A 03/02/2020  ? Procedure: COLONOSCOPY;  Surgeon: Ronald Lobo, MD;  Location: [facility] ENDOSCOPY;  Service: Endoscopy;  Laterality: N/A;  ? COLONOSCOPY WITH PROPOFOL N/A 04/28/2014  ? Procedure: COLONOSCOPY WITH PROPOFOL;  Surgeon: Cleotis Nipper, MD;  Location: [facility] ENDOSCOPY;  Service: Endoscopy;  Laterality: N/A;  ? DG TOES*L*  2/10  ? rt  ? DILATION AND CURETTAGE OF UTERUS    ? ENTEROSCOPY N/A 10/17/2017  ? Procedure: ENTEROSCOPY;  Surgeon: Otis Brace, MD;  Location: [facility];  Service: Gastroenterology;  Laterality: N/A;  ? ESOPHAGOGASTRODUODENOSCOPY N/A 04/10/2014  ? Procedure: ESOPHAGOGASTRODUODENOSCOPY (EGD);  Surgeon: [name], MD;  Location: [facility] ENDOSCOPY;  Service: Endoscopy;  Laterality: N/A;  ? ESOPHAGOGASTRODUODENOSCOPY N/A 05/10/2017  ? Procedure: ESOPHAGOGASTRODUODENOSCOPY (EGD);  Surgeon: Ronald Lobo, MD;  Location: [facility] ENDOSCOPY;  Service: Endoscopy;  Laterality: N/A;  ? ESOPHAGOGASTRODUODENOSCOPY N/A 09/22/2017  ? Procedure: ESOPHAGOGASTRODUODENOSCOPY (EGD);  Surgeon: Clarene Essex, MD;  Location: [facility] [place];  Service: Endoscopy;  Laterality: N/A;  bedside  ? ESOPHAGOGASTRODUODENOSCOPY N/A 03/02/2020  ? Procedure: ESOPHAGOGASTRODUODENOSCOPY (EGD);  Surgeon: Ronald Lobo, MD;  Location: [facility] ENDOSCOPY;  Service: Endoscopy;  Laterality: N/A;  ? ESOPHAGOGASTRODUODENOSCOPY N/A 11/03/2020  ? Procedure: ESOPHAGOGASTRODUODENOSCOPY (EGD);  Surgeon: Wilford Corner, MD;  Location: [facility];  Service: Endoscopy;  Laterality: N/A;  ? ESOPHAGOGASTRODUODENOSCOPY (EGD) WITH PROPOFOL N/A 04/27/2014  ? Procedure: ESOPHAGOGASTRODUODENOSCOPY (EGD) WITH PROPOFOL;  Surgeon: Cleotis Nipper, MD;  Location: [facility] ENDOSCOPY;  Service: Endoscopy;  Laterality: N/A;  possible apc  ? ESOPHAGOGASTRODUODENOSCOPY (EGD) WITH PROPOFOL N/A 09/30/2017  ? Procedure: ESOPHAGOGASTRODUODENOSCOPY (EGD) WITH PROPOFOL;  Surgeon: [name], MD;  Location: [place];  Service: Gastroenterology;  Laterality: N/A;  ? ESOPHAGOGASTRODUODENOSCOPY (EGD) WITH PROPOFOL N/A  10/01/2017  ? Procedure: ESOPHAGOGASTRODUODENOSCOPY (EGD) WITH PROPOFOL;  Surgeon: [name], MD;  Location: [place];  Service: Gastroenterology;  Laterality: N/A;  ? ESOPHAGOGASTRODUODENOSCOPY (EGD) WITH PROPOFOL N/A 10/08/2017  ? Procedure: ESOPHAGOGASTRODUODENOSCOPY (EGD) WITH PROPOFOL;  Surgeon: Otis Brace, MD;  Location: [facility] ENDOSCOPY;  Service: Gastroenterology;  Laterality: N/A;  ? ESOPHAGOGASTRODUODENOSCOPY (EGD) WITH PROPOFOL N/A 10/17/2017  ? Procedure: ESOPHAGOGASTRODUODENOSCOPY (EGD) WITH PROPOFOL;  Surgeon: Otis Brace, MD;  Location: [facility] ENDOSCOPY;  Service: Gastroenterology;  Laterality: N/A;  ? ESOPHAGOGASTRODUODENOSCOPY (EGD) WITH PROPOFOL N/A 10/19/2017  ? Procedure: ESOPHAGOGASTRODUODENOSCOPY (EGD) WITH PROPOFOL;  Surgeon: Otis Brace, MD;  Location: [facility] ENDOSCOPY;  Service: Gastroenterology;  Laterality: N/A;  ? ESOPHAGOGASTRODUODENOSCOPY (EGD) WITH PROPOFOL N/A 12/04/2018  ? Procedure: ESOPHAGOGASTRODUODENOSCOPY (EGD) WITH PROPOFOL;  Surgeon: Wilford Corner, MD;  Location: [facility] ENDOSCOPY;  Service: Endoscopy;  Laterality: N/A;  ? GIVENS CAPSULE STUDY N/A 10/02/2017  ? Procedure: GIVENS CAPSULE STUDY;  Surgeon: [name], MD;  Location: [place];  Service: Gastroenterology;  Laterality: N/A;  ? GIVENS CAPSULE STUDY N/A 10/08/2017  ? Procedure: GIVENS CAPSULE STUDY;  Surgeon: Otis Brace, MD;  Location: [place];  Service: Gastroenterology;  Laterality: N/A;  endoscopic placement of capsule  ? GIVENS CAPSULE STUDY N/A 03/02/2020  ? Procedure: GIVENS CAPSULE STUDY;  Surgeon: Ronald Lobo, MD;  Location: [facility] ENDOSCOPY;  Service: Endoscopy;  Laterality: N/A;  ? HEMOSTASIS CLIP PLACEMENT  12/04/2018  ? Procedure: HEMOSTASIS CLIP PLACEMENT;  Surgeon: Wilford Corner, MD;  Location: [facility] ENDOSCOPY;  Service: Endoscopy;;  ?  HOT HEMOSTASIS N/A 04/27/2014  ? Procedure: HOT HEMOSTASIS (ARGON PLASMA COAGULATION/BICAP);  Surgeon: Cleotis Nipper, MD;  Location: [facility] ENDOSCOPY;   Service: Endoscopy;  Laterality: N/A;  ? HOT HEMOSTASIS N/A 09/30/2017  ? Procedure: HOT HEMOSTASIS (ARGON PLASMA COAGULATION/BICAP);  Surgeon: [name], MD;  Location: [place];  Service: Gastroenterology;

## 2021-07-19 ENCOUNTER — Inpatient Hospital Stay: Payer: Medicaid Other

## 2021-07-19 ENCOUNTER — Inpatient Hospital Stay: Payer: Medicaid Other | Attending: Hematology

## 2021-07-19 ENCOUNTER — Other Ambulatory Visit: Payer: Self-pay

## 2021-07-19 VITALS — BP 154/65 | HR 65 | Temp 98.6°F | Resp 18

## 2021-07-19 DIAGNOSIS — Z5112 Encounter for antineoplastic immunotherapy: Secondary | ICD-10-CM | POA: Insufficient documentation

## 2021-07-19 DIAGNOSIS — I780 Hereditary hemorrhagic telangiectasia: Secondary | ICD-10-CM | POA: Insufficient documentation

## 2021-07-19 DIAGNOSIS — K922 Gastrointestinal hemorrhage, unspecified: Secondary | ICD-10-CM | POA: Diagnosis not present

## 2021-07-19 DIAGNOSIS — D500 Iron deficiency anemia secondary to blood loss (chronic): Secondary | ICD-10-CM | POA: Diagnosis present

## 2021-07-19 DIAGNOSIS — D649 Anemia, unspecified: Secondary | ICD-10-CM

## 2021-07-19 DIAGNOSIS — Z95828 Presence of other vascular implants and grafts: Secondary | ICD-10-CM

## 2021-07-19 LAB — CMP (CANCER CENTER ONLY)
ALT: 7 U/L (ref 0–44)
AST: 12 U/L — ABNORMAL LOW (ref 15–41)
Albumin: 3.9 g/dL (ref 3.5–5.0)
Alkaline Phosphatase: 67 U/L (ref 38–126)
Anion gap: 8 (ref 5–15)
BUN: 10 mg/dL (ref 6–20)
CO2: 26 mmol/L (ref 22–32)
Calcium: 9 mg/dL (ref 8.9–10.3)
Chloride: 107 mmol/L (ref 98–111)
Creatinine: 0.86 mg/dL (ref 0.44–1.00)
GFR, Estimated: >60 mL/min (ref >60)
Glucose, Bld: 96 mg/dL (ref 70–99)
Potassium: 3.7 mmol/L (ref 3.5–5.1)
Sodium: 141 mmol/L (ref 135–145)
Total Bilirubin: 0.2 mg/dL — ABNORMAL LOW (ref 0.3–1.2)
Total Protein: 7.5 g/dL (ref 6.5–8.1)

## 2021-07-19 LAB — CBC WITH DIFFERENTIAL (CANCER CENTER ONLY)
Abs Immature Granulocytes: 0.03 10*3/uL (ref 0.00–0.07)
Basophils Absolute: 0 10*3/uL (ref 0.0–0.1)
Basophils Relative: 0 %
Eosinophils Absolute: 0.1 10*3/uL (ref 0.0–0.5)
Eosinophils Relative: 1 %
HCT: 29.7 % — ABNORMAL LOW (ref 36.0–46.0)
Hemoglobin: 9.1 g/dL — ABNORMAL LOW (ref 12.0–15.0)
Immature Granulocytes: 0 %
Lymphocytes Relative: 11 %
Lymphs Abs: 1.2 10*3/uL (ref 0.7–4.0)
MCH: 26.1 pg (ref 26.0–34.0)
MCHC: 30.6 g/dL (ref 30.0–36.0)
MCV: 85.3 fL (ref 80.0–100.0)
Monocytes Absolute: 0.5 10*3/uL (ref 0.1–1.0)
Monocytes Relative: 5 %
Neutro Abs: 9 10*3/uL — ABNORMAL HIGH (ref 1.7–7.7)
Neutrophils Relative %: 83 %
Platelet Count: 264 10*3/uL (ref 150–400)
RBC: 3.48 MIL/uL — ABNORMAL LOW (ref 3.87–5.11)
RDW: 22.9 % — ABNORMAL HIGH (ref 11.5–15.5)
WBC Count: 10.9 10*3/uL — ABNORMAL HIGH (ref 4.0–10.5)
nRBC: 0 % (ref 0.0–0.2)

## 2021-07-19 LAB — SAMPLE TO BLOOD BANK

## 2021-07-19 MED ORDER — HEPARIN SOD (PORK) LOCK FLUSH 100 UNIT/ML IV SOLN
500.0000 [IU] | Freq: Once | INTRAVENOUS | Status: AC
  Administered 2021-07-19: 500 [IU]

## 2021-07-19 MED ORDER — SODIUM CHLORIDE 0.9% FLUSH
10.0000 mL | Freq: Once | INTRAVENOUS | Status: AC
  Administered 2021-07-19: 10 mL

## 2021-07-19 MED ORDER — SODIUM CHLORIDE 0.9 % IV SOLN
250.0000 mg | Freq: Once | INTRAVENOUS | Status: AC
  Administered 2021-07-19: 250 mg via INTRAVENOUS
  Filled 2021-07-19: qty 20

## 2021-07-19 MED ORDER — SODIUM CHLORIDE 0.9 % IV SOLN: Freq: Once | INTRAVENOUS | Status: AC

## 2021-07-19 NOTE — Patient Instructions (Signed)
Sodium Ferric Gluconate Complex Injection ?What is this medication? ?SODIUM FERRIC GLUCONATE COMPLEX (SOE dee um FER ik GLOO koe nate KOM pleks) treats low levels of iron (iron deficiency anemia) in people with kidney disease. Iron is a mineral that plays an important role in making red blood cells, which carry oxygen from your lungs to the rest of your body. ?This medicine may be used for other purposes; ask your health care provider or pharmacist if you have questions. ?COMMON BRAND NAME(S): Ferrlecit, Nulecit ?What should I tell my care team before I take this medication? ?They need to know if you have any of the following conditions: ?Anemia that is not from iron deficiency ?High levels of iron in the blood ?An unusual or allergic reaction to iron, other medications, foods, dyes, or preservatives ?Pregnant or are trying to become pregnant ?Breast-feeding ?How should I use this medication? ?This medication is injected into a vein. It is given by your care team in a hospital or clinic setting. ?Talk to your care team about the use of this medication in children. While it may be prescribed for children as young as 6 years for selected conditions, precautions do apply. ?Overdosage: If you think you have taken too much of this medicine contact a poison control center or emergency room at once. ?NOTE: This medicine is only for you. Do not share this medicine with others. ?What if I miss a dose? ?It is important not to miss your dose. Call your care team if you are unable to keep an appointment. ?What may interact with this medication? ?Do not take this medication with any of the following: ?Deferasirox ?Deferoxamine ?Dimercaprol ?This medication may also interact with the following: ?Other iron products ?This list may not describe all possible interactions. Give your health care provider a list of all the medicines, herbs, non-prescription drugs, or dietary supplements you use. Also tell them if you smoke, drink  alcohol, or use illegal drugs. Some items may interact with your medicine. ?What should I watch for while using this medication? ?Your condition will be monitored carefully while you are receiving this medication. ?Visit your care team for regular checks on your progress. You may need blood work while you are taking this medication. ?What side effects may I notice from receiving this medication? ?Side effects that you should report to your care team as soon as possible: ?Allergic reactions--skin rash, itching, hives, swelling of the face, lips, tongue, or throat ?Low blood pressure--dizziness, feeling faint or lightheaded, blurry vision ?Shortness of breath ?Side effects that usually do not require medical attention (report to your care team if they continue or are bothersome): ?Flushing ?Headache ?Joint pain ?Muscle pain ?Nausea ?Pain, redness, or irritation at injection site ?This list may not describe all possible side effects. Call your doctor for medical advice about side effects. You may report side effects to FDA at 1-800-FDA-1088. ?Where should I keep my medication? ?This medication is given in a hospital or clinic and will not be stored at home. ?NOTE: This sheet is a summary. It may not cover all possible information. If you have questions about this medicine, talk to your doctor, pharmacist, or health care provider. ?? 2022 Elsevier/Gold Standard (2020-08-25 00:00:00) ? ?

## 2021-07-26 ENCOUNTER — Inpatient Hospital Stay: Payer: Medicaid Other

## 2021-07-30 DIAGNOSIS — N3941 Urge incontinence: Secondary | ICD-10-CM | POA: Diagnosis not present

## 2021-07-30 DIAGNOSIS — N319 Neuromuscular dysfunction of bladder, unspecified: Secondary | ICD-10-CM | POA: Diagnosis not present

## 2021-07-31 ENCOUNTER — Telehealth: Payer: Self-pay

## 2021-07-31 NOTE — Telephone Encounter (Signed)
Requesting to speak with a nurse about traZODone (DESYREL) 100 MG tablet. Please call pt back.  ?

## 2021-08-01 NOTE — Telephone Encounter (Signed)
Return pt's call. Mailbox is full, unable to leave a message. ?

## 2021-08-02 ENCOUNTER — Inpatient Hospital Stay: Payer: Medicaid Other

## 2021-08-02 ENCOUNTER — Other Ambulatory Visit: Payer: Self-pay | Admitting: *Deleted

## 2021-08-02 ENCOUNTER — Other Ambulatory Visit: Payer: Self-pay

## 2021-08-02 ENCOUNTER — Encounter: Payer: Self-pay | Admitting: Licensed Clinical Social Worker

## 2021-08-02 VITALS — BP 135/67 | HR 64 | Temp 97.7°F | Resp 16 | Wt 233.5 lb

## 2021-08-02 DIAGNOSIS — D500 Iron deficiency anemia secondary to blood loss (chronic): Secondary | ICD-10-CM

## 2021-08-02 DIAGNOSIS — Z95828 Presence of other vascular implants and grafts: Secondary | ICD-10-CM

## 2021-08-02 DIAGNOSIS — D649 Anemia, unspecified: Secondary | ICD-10-CM

## 2021-08-02 DIAGNOSIS — I780 Hereditary hemorrhagic telangiectasia: Secondary | ICD-10-CM

## 2021-08-02 DIAGNOSIS — Q2733 Arteriovenous malformation of digestive system vessel: Secondary | ICD-10-CM

## 2021-08-02 DIAGNOSIS — Z5112 Encounter for antineoplastic immunotherapy: Secondary | ICD-10-CM | POA: Diagnosis not present

## 2021-08-02 LAB — CBC WITH DIFFERENTIAL (CANCER CENTER ONLY)
Abs Immature Granulocytes: 0.05 10*3/uL (ref 0.00–0.07)
Basophils Absolute: 0 10*3/uL (ref 0.0–0.1)
Basophils Relative: 0 %
Eosinophils Absolute: 0.1 10*3/uL (ref 0.0–0.5)
Eosinophils Relative: 1 %
HCT: 32.3 % — ABNORMAL LOW (ref 36.0–46.0)
Hemoglobin: 9.7 g/dL — ABNORMAL LOW (ref 12.0–15.0)
Immature Granulocytes: 0 %
Lymphocytes Relative: 12 %
Lymphs Abs: 1.4 10*3/uL (ref 0.7–4.0)
MCH: 25.9 pg — ABNORMAL LOW (ref 26.0–34.0)
MCHC: 30 g/dL (ref 30.0–36.0)
MCV: 86.4 fL (ref 80.0–100.0)
Monocytes Absolute: 0.6 10*3/uL (ref 0.1–1.0)
Monocytes Relative: 5 %
Neutro Abs: 10.1 10*3/uL — ABNORMAL HIGH (ref 1.7–7.7)
Neutrophils Relative %: 82 %
Platelet Count: 263 10*3/uL (ref 150–400)
RBC: 3.74 MIL/uL — ABNORMAL LOW (ref 3.87–5.11)
RDW: 22.1 % — ABNORMAL HIGH (ref 11.5–15.5)
WBC Count: 12.3 10*3/uL — ABNORMAL HIGH (ref 4.0–10.5)
nRBC: 0 % (ref 0.0–0.2)

## 2021-08-02 LAB — SAMPLE TO BLOOD BANK

## 2021-08-02 LAB — TOTAL PROTEIN, URINE DIPSTICK: Protein, ur: 100 mg/dL — AB

## 2021-08-02 LAB — FERRITIN: Ferritin: 126 ng/mL (ref 11–307)

## 2021-08-02 MED ORDER — SODIUM CHLORIDE 0.9% FLUSH
10.0000 mL | Freq: Once | INTRAVENOUS | Status: AC
  Administered 2021-08-02: 10 mL

## 2021-08-02 MED ORDER — SODIUM CHLORIDE 0.9 % IV SOLN
250.0000 mg | Freq: Once | INTRAVENOUS | Status: AC
  Administered 2021-08-02: 250 mg via INTRAVENOUS
  Filled 2021-08-02: qty 20

## 2021-08-02 MED ORDER — SODIUM CHLORIDE 0.9 % IV SOLN
5.0000 mg/kg | Freq: Once | INTRAVENOUS | Status: AC
  Administered 2021-08-02: 500 mg via INTRAVENOUS
  Filled 2021-08-02: qty 16

## 2021-08-02 MED ORDER — SODIUM CHLORIDE 0.9 % IV SOLN: INTRAVENOUS | Status: DC

## 2021-08-02 MED ORDER — HEPARIN SOD (PORK) LOCK FLUSH 100 UNIT/ML IV SOLN
500.0000 [IU] | Freq: Once | INTRAVENOUS | Status: AC
  Administered 2021-08-02: 500 [IU]

## 2021-08-02 NOTE — Progress Notes (Signed)
[facility] CSW Progress Note ? ?Clinical Social Worker met with patient to provide details for transportation which CSW booked through May 18th.  CSW to continue to call pt 1 day prior to appointment to remind of pick up time. ? ? ? ?Henriette Combs , LCSW ?

## 2021-08-02 NOTE — Progress Notes (Signed)
Per Dr. Alvy Bimler, ok to give bevacizumab today if urine protein is 100 or less. ?

## 2021-08-07 ENCOUNTER — Institutional Professional Consult (permissible substitution): Payer: Medicaid Other | Admitting: Behavioral Health

## 2021-08-07 ENCOUNTER — Telehealth: Payer: Self-pay | Admitting: Behavioral Health

## 2021-08-07 NOTE — Telephone Encounter (Signed)
Unable to lv msg for Pt today. Mbox is full. Will r/s Pt for 2-3 wks out. ? ?Dr. Theodis Shove ? ? ?

## 2021-08-09 ENCOUNTER — Other Ambulatory Visit: Payer: Self-pay | Admitting: Hematology

## 2021-08-09 ENCOUNTER — Inpatient Hospital Stay: Payer: Medicaid Other

## 2021-08-16 ENCOUNTER — Inpatient Hospital Stay: Payer: Medicaid Other | Attending: Hematology

## 2021-08-16 ENCOUNTER — Inpatient Hospital Stay: Payer: Medicaid Other

## 2021-08-16 ENCOUNTER — Telehealth: Payer: Self-pay | Admitting: Hematology

## 2021-08-16 DIAGNOSIS — Z79899 Other long term (current) drug therapy: Secondary | ICD-10-CM | POA: Insufficient documentation

## 2021-08-16 DIAGNOSIS — Z5112 Encounter for antineoplastic immunotherapy: Secondary | ICD-10-CM | POA: Insufficient documentation

## 2021-08-16 DIAGNOSIS — I780 Hereditary hemorrhagic telangiectasia: Secondary | ICD-10-CM | POA: Insufficient documentation

## 2021-08-16 DIAGNOSIS — D500 Iron deficiency anemia secondary to blood loss (chronic): Secondary | ICD-10-CM | POA: Insufficient documentation

## 2021-08-16 NOTE — Telephone Encounter (Signed)
Rescheduled 05/04 to 05/05 per patient's request, patient is notified. ?

## 2021-08-17 ENCOUNTER — Inpatient Hospital Stay: Payer: Medicaid Other

## 2021-08-17 ENCOUNTER — Other Ambulatory Visit: Payer: Self-pay

## 2021-08-17 VITALS — BP 154/85 | HR 72 | Temp 98.7°F | Resp 17 | Wt 227.0 lb

## 2021-08-17 DIAGNOSIS — D500 Iron deficiency anemia secondary to blood loss (chronic): Secondary | ICD-10-CM

## 2021-08-17 DIAGNOSIS — I780 Hereditary hemorrhagic telangiectasia: Secondary | ICD-10-CM

## 2021-08-17 DIAGNOSIS — Q2733 Arteriovenous malformation of digestive system vessel: Secondary | ICD-10-CM

## 2021-08-17 DIAGNOSIS — Z79899 Other long term (current) drug therapy: Secondary | ICD-10-CM | POA: Diagnosis not present

## 2021-08-17 DIAGNOSIS — D649 Anemia, unspecified: Secondary | ICD-10-CM

## 2021-08-17 DIAGNOSIS — Z95828 Presence of other vascular implants and grafts: Secondary | ICD-10-CM

## 2021-08-17 DIAGNOSIS — Z5112 Encounter for antineoplastic immunotherapy: Secondary | ICD-10-CM | POA: Diagnosis present

## 2021-08-17 LAB — CBC WITH DIFFERENTIAL (CANCER CENTER ONLY)
Abs Immature Granulocytes: 0.04 10*3/uL (ref 0.00–0.07)
Basophils Absolute: 0 10*3/uL (ref 0.0–0.1)
Basophils Relative: 0 %
Eosinophils Absolute: 0.1 10*3/uL (ref 0.0–0.5)
Eosinophils Relative: 1 %
HCT: 35.3 % — ABNORMAL LOW (ref 36.0–46.0)
Hemoglobin: 11.1 g/dL — ABNORMAL LOW (ref 12.0–15.0)
Immature Granulocytes: 0 %
Lymphocytes Relative: 15 %
Lymphs Abs: 1.6 10*3/uL (ref 0.7–4.0)
MCH: 26.6 pg (ref 26.0–34.0)
MCHC: 31.4 g/dL (ref 30.0–36.0)
MCV: 84.4 fL (ref 80.0–100.0)
Monocytes Absolute: 0.5 10*3/uL (ref 0.1–1.0)
Monocytes Relative: 5 %
Neutro Abs: 8.2 10*3/uL — ABNORMAL HIGH (ref 1.7–7.7)
Neutrophils Relative %: 79 %
Platelet Count: 266 10*3/uL (ref 150–400)
RBC: 4.18 MIL/uL (ref 3.87–5.11)
RDW: 21.4 % — ABNORMAL HIGH (ref 11.5–15.5)
WBC Count: 10.5 10*3/uL (ref 4.0–10.5)
nRBC: 0 % (ref 0.0–0.2)

## 2021-08-17 LAB — SAMPLE TO BLOOD BANK

## 2021-08-17 LAB — FERRITIN: Ferritin: 148 ng/mL (ref 11–307)

## 2021-08-17 MED ORDER — SODIUM CHLORIDE 0.9% FLUSH
10.0000 mL | Freq: Once | INTRAVENOUS | Status: AC
  Administered 2021-08-17: 10 mL

## 2021-08-17 MED ORDER — SODIUM CHLORIDE 0.9 % IV SOLN
5.0000 mg/kg | Freq: Once | INTRAVENOUS | Status: AC
  Administered 2021-08-17: 500 mg via INTRAVENOUS
  Filled 2021-08-17: qty 16

## 2021-08-17 MED ORDER — SODIUM CHLORIDE 0.9 % IV SOLN
250.0000 mg | Freq: Once | INTRAVENOUS | Status: AC
  Administered 2021-08-17: 250 mg via INTRAVENOUS
  Filled 2021-08-17: qty 20

## 2021-08-17 MED ORDER — SODIUM CHLORIDE 0.9 % IV SOLN: Freq: Once | INTRAVENOUS | Status: DC

## 2021-08-17 MED ORDER — HEPARIN SOD (PORK) LOCK FLUSH 100 UNIT/ML IV SOLN
500.0000 [IU] | Freq: Once | INTRAVENOUS | Status: AC
  Administered 2021-08-17: 500 [IU]

## 2021-08-17 NOTE — Patient Instructions (Signed)
[facility]  Discharge Instructions: ?Thank you for choosing [facility] to provide your oncology and hematology care.  ? ?If you have a lab appointment with the [facility], please go directly to the [facility] and check in at the registration area. ?  ?Wear comfortable clothing and clothing appropriate for easy access to any Portacath or PICC line.  ? ?We strive to give you quality time with your provider. You may need to reschedule your appointment if you arrive late (15 or more minutes).  Arriving late affects you and other patients whose appointments are after yours.  Also, if you miss three or more appointments without notifying the office, you may be dismissed from the clinic at the provider?s discretion.    ?  ?For prescription refill requests, have your pharmacy contact our office and allow 72 hours for refills to be completed.   ? ?Today you received the following chemotherapy and/or immunotherapy agent: Zirabev, iron infusion    ?  ?To help prevent nausea and vomiting after your treatment, we encourage you to take your nausea medication as directed. ? ?BELOW ARE SYMPTOMS THAT SHOULD BE REPORTED IMMEDIATELY: ?*FEVER GREATER THAN 100.4 F (38 ?C) OR HIGHER ?*CHILLS OR SWEATING ?*NAUSEA AND VOMITING THAT IS NOT CONTROLLED WITH YOUR NAUSEA MEDICATION ?*UNUSUAL SHORTNESS OF BREATH ?*UNUSUAL BRUISING OR BLEEDING ?*URINARY PROBLEMS (pain or burning when urinating, or frequent urination) ?*BOWEL PROBLEMS (unusual diarrhea, constipation, pain near the anus) ?TENDERNESS IN MOUTH AND THROAT WITH OR WITHOUT PRESENCE OF ULCERS (sore throat, sores in mouth, or a toothache) ?UNUSUAL RASH, SWELLING OR PAIN  ?UNUSUAL VAGINAL DISCHARGE OR ITCHING  ? ?Items with * indicate a potential emergency and should be followed up as soon as possible or go to the Emergency Department if any problems should occur. ? ?Please show the CHEMOTHERAPY ALERT CARD or IMMUNOTHERAPY ALERT CARD at  check-in to the Emergency Department and triage nurse. ? ?Should you have questions after your visit or need to cancel or reschedule your appointment, please contact [facility]  Dept: [phone]  and follow the prompts.  Office hours are 8:00 a.m. to 4:30 p.m. Monday - Friday. Please note that voicemails left after 4:00 p.m. may not be returned until the following business day.  We are closed weekends and major holidays. You have access to a nurse at all times for urgent questions. Please call the main number to the clinic Dept: [phone] and follow the prompts. ? ? ?For any non-urgent questions, you may also contact your provider using MyChart. We now offer e-Visits for anyone 30 and older to request care online for non-urgent symptoms. For details visit [email or web address]. ?  ?Also download the MyChart app! Go to the app store, search "MyChart", open the app, select [facility], and log in with your MyChart username and password. ? ?Due to Covid, a mask is required upon entering the hospital/clinic. If you do not have a mask, one will be given to you upon arrival. For doctor visits, patients may have 1 support person aged 70 or older with them. For treatment visits, patients cannot have anyone with them due to current Covid guidelines and our immunocompromised population.  ? ?Iron Sucrose Injection ?What is this medication? ?IRON SUCROSE (EYE ern SOO krose) treats low levels of iron (iron deficiency anemia) in people with kidney disease. Iron is a mineral that plays an important role in making red blood cells, which carry oxygen from your lungs to  the rest of your body. ?This medicine may be used for other purposes; ask your health care provider or pharmacist if you have questions. ?COMMON BRAND NAME(S): Venofer, Ferrlecit ?What should I tell my care team before I take this medication? ?They need to know if you have any of these conditions: ?Anemia not caused by low iron  levels ?Heart disease ?High levels of iron in the blood ?Kidney disease ?Liver disease ?An unusual or allergic reaction to iron, other medications, foods, dyes, or preservatives ?Pregnant or trying to get pregnant ?Breast-feeding ?How should I use this medication? ?This medication is for infusion into a vein. It is given in a hospital or clinic setting. ?Talk to your care team about the use of this medication in children. While this medication may be prescribed for children as young as 2 years for selected conditions, precautions do apply. ?Overdosage: If you think you have taken too much of this medicine contact a poison control center or emergency room at once. ?NOTE: This medicine is only for you. Do not share this medicine with others. ?What if I miss a dose? ?It is important not to miss your dose. Call your care team if you are unable to keep an appointment. ?What may interact with this medication? ?Do not take this medication with any of the following: ?Deferoxamine ?Dimercaprol ?Other iron products ?This medication may also interact with the following: ?Chloramphenicol ?Deferasirox ?This list may not describe all possible interactions. Give your health care provider a list of all the medicines, herbs, non-prescription drugs, or dietary supplements you use. Also tell them if you smoke, drink alcohol, or use illegal drugs. Some items may interact with your medicine. ?What should I watch for while using this medication? ?Visit your care team regularly. Tell your care team if your symptoms do not start to get better or if they get worse. You may need blood work done while you are taking this medication. ?You may need to follow a special diet. Talk to your care team. Foods that contain iron include: whole grains/cereals, dried fruits, beans, or peas, leafy green vegetables, and organ meats (liver, kidney). ?What side effects may I notice from receiving this medication? ?Side effects that you should report to your  care team as soon as possible: ?Allergic reactions--skin rash, itching, hives, swelling of the face, lips, tongue, or throat ?Low blood pressure--dizziness, feeling faint or lightheaded, blurry vision ?Shortness of breath ?Side effects that usually do not require medical attention (report to your care team if they continue or are bothersome): ?Flushing ?Headache ?Joint pain ?Muscle pain ?Nausea ?Pain, redness, or irritation at injection site ?This list may not describe all possible side effects. Call your doctor for medical advice about side effects. You may report side effects to FDA at 1-800-FDA-1088. ?Where should I keep my medication? ?This medication is given in a hospital or clinic and will not be stored at home. ?NOTE: This sheet is a summary. It may not cover all possible information. If you have questions about this medicine, talk to your doctor, pharmacist, or health care provider. ?? 2022 Elsevier/Gold Standard (2020-08-25 00:00:00) ? ?

## 2021-08-23 ENCOUNTER — Inpatient Hospital Stay: Payer: Medicaid Other

## 2021-08-23 ENCOUNTER — Inpatient Hospital Stay: Payer: Medicaid Other | Admitting: Hematology

## 2021-08-30 ENCOUNTER — Encounter: Payer: Self-pay | Admitting: Licensed Clinical Social Worker

## 2021-08-30 ENCOUNTER — Other Ambulatory Visit: Payer: Self-pay

## 2021-08-30 ENCOUNTER — Inpatient Hospital Stay: Payer: Medicaid Other

## 2021-08-30 ENCOUNTER — Other Ambulatory Visit (HOSPITAL_COMMUNITY): Payer: Self-pay

## 2021-08-30 ENCOUNTER — Inpatient Hospital Stay (HOSPITAL_BASED_OUTPATIENT_CLINIC_OR_DEPARTMENT_OTHER): Payer: Medicaid Other | Admitting: Hematology

## 2021-08-30 VITALS — BP 139/71 | HR 66 | Temp 99.0°F | Resp 17 | Wt 229.8 lb

## 2021-08-30 DIAGNOSIS — Q2733 Arteriovenous malformation of digestive system vessel: Secondary | ICD-10-CM

## 2021-08-30 DIAGNOSIS — F331 Major depressive disorder, recurrent, moderate: Secondary | ICD-10-CM | POA: Diagnosis not present

## 2021-08-30 DIAGNOSIS — I780 Hereditary hemorrhagic telangiectasia: Secondary | ICD-10-CM

## 2021-08-30 DIAGNOSIS — D500 Iron deficiency anemia secondary to blood loss (chronic): Secondary | ICD-10-CM

## 2021-08-30 DIAGNOSIS — R040 Epistaxis: Secondary | ICD-10-CM

## 2021-08-30 DIAGNOSIS — Z95828 Presence of other vascular implants and grafts: Secondary | ICD-10-CM

## 2021-08-30 DIAGNOSIS — D649 Anemia, unspecified: Secondary | ICD-10-CM

## 2021-08-30 DIAGNOSIS — Z5112 Encounter for antineoplastic immunotherapy: Secondary | ICD-10-CM | POA: Diagnosis not present

## 2021-08-30 LAB — CBC WITH DIFFERENTIAL (CANCER CENTER ONLY)
Abs Immature Granulocytes: 0.03 10*3/uL (ref 0.00–0.07)
Basophils Absolute: 0 10*3/uL (ref 0.0–0.1)
Basophils Relative: 0 %
Eosinophils Absolute: 0.1 10*3/uL (ref 0.0–0.5)
Eosinophils Relative: 1 %
HCT: 31.8 % — ABNORMAL LOW (ref 36.0–46.0)
Hemoglobin: 9.8 g/dL — ABNORMAL LOW (ref 12.0–15.0)
Immature Granulocytes: 0 %
Lymphocytes Relative: 14 %
Lymphs Abs: 1.4 10*3/uL (ref 0.7–4.0)
MCH: 26.3 pg (ref 26.0–34.0)
MCHC: 30.8 g/dL (ref 30.0–36.0)
MCV: 85.5 fL (ref 80.0–100.0)
Monocytes Absolute: 0.6 10*3/uL (ref 0.1–1.0)
Monocytes Relative: 6 %
Neutro Abs: 8.2 10*3/uL — ABNORMAL HIGH (ref 1.7–7.7)
Neutrophils Relative %: 79 %
Platelet Count: 268 10*3/uL (ref 150–400)
RBC: 3.72 MIL/uL — ABNORMAL LOW (ref 3.87–5.11)
RDW: 21.2 % — ABNORMAL HIGH (ref 11.5–15.5)
WBC Count: 10.4 10*3/uL (ref 4.0–10.5)
nRBC: 0 % (ref 0.0–0.2)

## 2021-08-30 LAB — SAMPLE TO BLOOD BANK

## 2021-08-30 LAB — FERRITIN: Ferritin: 158 ng/mL (ref 11–307)

## 2021-08-30 MED ORDER — SODIUM CHLORIDE 0.9% FLUSH
10.0000 mL | Freq: Once | INTRAVENOUS | Status: AC
  Administered 2021-08-30: 10 mL

## 2021-08-30 MED ORDER — AMINOCAPROIC ACID 1000 MG PO TABS
1000.0000 mg | ORAL_TABLET | Freq: Two times a day (BID) | ORAL | 2 refills | Status: DC
  Filled 2021-08-30 – 2021-08-31 (×2): qty 30, 15d supply, fill #0

## 2021-08-30 MED ORDER — HEPARIN SOD (PORK) LOCK FLUSH 100 UNIT/ML IV SOLN
500.0000 [IU] | Freq: Once | INTRAVENOUS | Status: AC
  Administered 2021-08-30: 500 [IU]

## 2021-08-30 MED ORDER — ALPRAZOLAM 0.5 MG PO TABS: 0.5000 mg | ORAL_TABLET | Freq: Every evening | ORAL | 0 refills | Status: DC | PRN

## 2021-08-30 MED ORDER — TRAZODONE HCL 100 MG PO TABS: ORAL_TABLET | ORAL | 2 refills | Status: DC

## 2021-08-30 MED ORDER — SODIUM CHLORIDE 0.9 % IV SOLN
250.0000 mg | Freq: Once | INTRAVENOUS | Status: AC
  Administered 2021-08-30: 250 mg via INTRAVENOUS
  Filled 2021-08-30: qty 10

## 2021-08-30 MED ORDER — SODIUM CHLORIDE 0.9 % IV SOLN
5.0000 mg/kg | Freq: Once | INTRAVENOUS | Status: AC
  Administered 2021-08-30: 500 mg via INTRAVENOUS
  Filled 2021-08-30: qty 4

## 2021-08-30 NOTE — Progress Notes (Signed)
[facility] CSW Progress Note  Clinical Education officer, museum  received a call from [facility] regarding pt's Medicaid transportation.   Per [employer] pt has consistently sent transportation away on the days it has been booked for her.  Hospital transport has been booked for her next appointment.    Henriette Combs, LCSW    Patient is participating in a Managed Medicaid Plan:  Yes

## 2021-08-30 NOTE — Progress Notes (Signed)
[facility]   [phone] [phone]   Clinic Follow up Note   Patient Care Team: [name], MD as PCP - General  Date of Service:  08/30/2021  CHIEF COMPLAINT: f/u of HHT and anemia  CURRENT THERAPY:  -Blood transfusion for Hg<8.0 (2units on different days if Hg<7) -iv ferric gluconate every week, with ferritin goal 100-200.  -Avastin/Zirabev restarted on 04/03/18. Currently given PRN due to proteinuria. -Amicar 1g, intermittently since 07/17/18. Currently TID since 07/07/20.  ASSESSMENT & PLAN:  Peggy House is a 61 y.o. 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 -Pt was hospitalized several times from 1-10/2017 for severe recurrent GI bleeding from AVMs which required multiple blood transfusions and APCs. 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  -She is s/p IR embolization of the left Gastric and short gastric artery branch vessels on 12/10/17.  -She required cauterization of stomach for severe Gi bleeding in 11/2018, and epinephrine injection for bleeding ulcer on 11/03/20 -She has been treated with bevacizumab and frequent IV iron, and has not required hospital admission much since 10/2020 -she continues on Amicar TID, but not very compliant  -She has been on bevacizumab every 2-4 week, treatment has been intermittently held due to large proteinuria and missed appointments.  -she reports continued epistaxis about once every few weeks. -labs reviewed, hgb is 9.8 today. She did not come last week due to episode of epistaxis and fatigue. -We will proceed bevacizumab today and continue every 2 weeks -continue weekly IV iron unless ferrin high    2. 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    3. Smoking cessation -She was smoking 10 cigarettes a day on average before recent hospitalization. She is smoking about 1/2 ppd (as of 08/03/20) -I strongly encouraged her to stop smoking completely. She tried previously but had trouble.   4. HTN, Recently diagnosed DM, Anxiety/Depression -Continue medications. Will monitor on Avastin.   -Continue to f/u with PCP and behavioral health.     PLAN:  -proceed with IV iron and beva today -Lab and iv ferric gluconate '250mg'$  weekly x10 -Beva every 2 weeks, with urine protein every 4 weeks  -F/u in 8 weeks  -I refilled amicar, xanax and trazodone.  I strongly encouraged her to follow-up with PCP for other medical issues.   No problem-specific Assessment & Plan notes found for this encounter.   INTERVAL HISTORY:  Peggy House is here for a follow up of HHT and anemia. She was last seen by me on 07/12/21. She was seen in the infusion area.  She had a few episodes of epistaxis last week, longest was 1 hour.  She feels fatigued the last week and did not come in for infusion.  She otherwise no other new changes.   All other systems were reviewed with the patient and are negative.  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    SURGICAL HISTORY: Past Surgical History:  Procedure Laterality Date   ABDOMINAL HYSTERECTOMY      CARPAL TUNNEL RELEASE  05/13/2011   Procedure: CARPAL TUNNEL RELEASE;  Surgeon: Nita Sells, MD;  Location: [place] [facility];  Service: Orthopedics;  Laterality: Left;   COLONOSCOPY N/A 03/02/2020   Procedure: COLONOSCOPY;  Surgeon: Ronald Lobo, MD;  Location: [facility];  Service: Endoscopy;  Laterality: N/A;   COLONOSCOPY WITH PROPOFOL N/A 04/28/2014   Procedure: COLONOSCOPY WITH PROPOFOL;  Surgeon: Cleotis Nipper, MD;  Location: [facility] ENDOSCOPY;  Service: Endoscopy;  Laterality: N/A;   DG TOES*L*  2/10   rt   DILATION AND CURETTAGE OF UTERUS     ENTEROSCOPY N/A 10/17/2017   Procedure: ENTEROSCOPY;  Surgeon: Otis Brace, MD;  Location: [place];  Service: Gastroenterology;  Laterality: N/A;   ESOPHAGOGASTRODUODENOSCOPY N/A 04/10/2014   Procedure: ESOPHAGOGASTRODUODENOSCOPY (EGD);  Surgeon: [name], MD;  Location: [facility] ENDOSCOPY;  Service: Endoscopy;  Laterality: N/A;   ESOPHAGOGASTRODUODENOSCOPY N/A 05/10/2017   Procedure: ESOPHAGOGASTRODUODENOSCOPY (EGD);  Surgeon: Ronald Lobo, MD;  Location: [facility] ENDOSCOPY;  Service: Endoscopy;  Laterality: N/A;   ESOPHAGOGASTRODUODENOSCOPY N/A 09/22/2017   Procedure: ESOPHAGOGASTRODUODENOSCOPY (EGD);  Surgeon: Clarene Essex, MD;  Location: [place];  Service: Endoscopy;  Laterality: N/A;  bedside   ESOPHAGOGASTRODUODENOSCOPY N/A 03/02/2020   Procedure: ESOPHAGOGASTRODUODENOSCOPY (EGD);  Surgeon: Ronald Lobo, MD;  Location: [facility];  Service: Endoscopy;  Laterality: N/A;   ESOPHAGOGASTRODUODENOSCOPY N/A 11/03/2020   Procedure: ESOPHAGOGASTRODUODENOSCOPY (EGD);  Surgeon: Wilford Corner, MD;  Location: [place];  Service: Endoscopy;  Laterality: N/A;   ESOPHAGOGASTRODUODENOSCOPY (EGD) WITH PROPOFOL N/A 04/27/2014   Procedure: ESOPHAGOGASTRODUODENOSCOPY (EGD) WITH PROPOFOL;  Surgeon: Cleotis Nipper, MD;  Location: [place];  Service: Endoscopy;  Laterality: N/A;  possible apc    ESOPHAGOGASTRODUODENOSCOPY (EGD) WITH PROPOFOL N/A 09/30/2017   Procedure: ESOPHAGOGASTRODUODENOSCOPY (EGD) WITH PROPOFOL;  Surgeon: [name], MD;  Location: [place];  Service: Gastroenterology;  Laterality: N/A;   ESOPHAGOGASTRODUODENOSCOPY (EGD) WITH PROPOFOL N/A 10/01/2017   Procedure: ESOPHAGOGASTRODUODENOSCOPY (EGD) WITH PROPOFOL;  Surgeon: [name], MD;  Location: [place];  Service: Gastroenterology;  Laterality: N/A;   ESOPHAGOGASTRODUODENOSCOPY (EGD) WITH PROPOFOL N/A 10/08/2017   Procedure: ESOPHAGOGASTRODUODENOSCOPY (EGD) WITH PROPOFOL;  Surgeon: Otis Brace, MD;  Location: [place];  Service: Gastroenterology;  Laterality: N/A;   ESOPHAGOGASTRODUODENOSCOPY (EGD) WITH PROPOFOL N/A 10/17/2017   Procedure: ESOPHAGOGASTRODUODENOSCOPY (EGD) WITH PROPOFOL;  Surgeon: Otis Brace, MD;  Location: [place];  Service: Gastroenterology;  Laterality: N/A;   ESOPHAGOGASTRODUODENOSCOPY (EGD) WITH PROPOFOL N/A 10/19/2017   Procedure: ESOPHAGOGASTRODUODENOSCOPY (EGD) WITH PROPOFOL;  Surgeon: Otis Brace, MD;  Location: [place];  Service: Gastroenterology;  Laterality: N/A;   ESOPHAGOGASTRODUODENOSCOPY (EGD) WITH PROPOFOL N/A 12/04/2018   Procedure: ESOPHAGOGASTRODUODENOSCOPY (EGD) WITH PROPOFOL;  Surgeon: Wilford Corner, MD;  [facility];  Service: Endoscopy;  Laterality: N/A;   GIVENS CAPSULE STUDY N/A 10/02/2017   Procedure: GIVENS CAPSULE STUDY;  Surgeon: [name], MD;  Location: [place];  Service: Gastroenterology;  Laterality: N/A;   GIVENS CAPSULE STUDY N/A 10/08/2017   Procedure: GIVENS CAPSULE STUDY;  Surgeon: Otis Brace, MD;  Location: [place];  Service: Gastroenterology;  Laterality: N/A;  endoscopic placement of capsule  GIVENS CAPSULE STUDY N/A 03/02/2020   Procedure: GIVENS CAPSULE STUDY;  Surgeon: Ronald Lobo, MD;  [facility];  Service: Endoscopy;  Laterality: N/A;   HEMOSTASIS CLIP PLACEMENT  12/04/2018    Procedure: HEMOSTASIS CLIP PLACEMENT;  Surgeon: Wilford Corner, MD;  [facility];  Service: Endoscopy;;   HOT HEMOSTASIS N/A 04/27/2014   Procedure: HOT HEMOSTASIS (ARGON PLASMA COAGULATION/BICAP);  Surgeon: Cleotis Nipper, MD;  Location: [facility] ENDOSCOPY;  Service: Endoscopy;  Laterality: N/A;   HOT HEMOSTASIS N/A 09/30/2017   Procedure: HOT HEMOSTASIS (ARGON PLASMA COAGULATION/BICAP);  Surgeon: [name], MD;  Location: [place];  Service: Gastroenterology;  Laterality: N/A;   HOT HEMOSTASIS N/A 10/01/2017   Procedure: HOT HEMOSTASIS (ARGON PLASMA COAGULATION/BICAP);  Surgeon: [name], MD;  Location: [place];  Service: Gastroenterology;  Laterality: N/A;   HOT HEMOSTASIS N/A 10/17/2017   Procedure: HOT HEMOSTASIS (ARGON PLASMA COAGULATION/BICAP);  Surgeon: Otis Brace, MD;  Location: [facility] ENDOSCOPY;  Service: Gastroenterology;  Laterality: N/A;   HOT HEMOSTASIS N/A 10/19/2017   Procedure: HOT HEMOSTASIS (ARGON PLASMA COAGULATION/BICAP);  Surgeon: Otis Brace, MD;  Location: [facility] ENDOSCOPY;  Service: Gastroenterology;  Laterality: N/A;   HOT HEMOSTASIS N/A 03/02/2020   Procedure: HOT HEMOSTASIS (ARGON PLASMA COAGULATION/BICAP);  Surgeon: Ronald Lobo, MD;  Location: [facility];  Service: Endoscopy;  Laterality: N/A;   IR IMAGING GUIDED PORT INSERTION  07/08/2018   L shoulder Surgery  2011   POLYPECTOMY  03/02/2020   Procedure: POLYPECTOMY;  Surgeon: Ronald Lobo, MD;  [facility];  Service: Endoscopy;;   SCLEROTHERAPY  11/03/2020   Procedure: Clide Deutscher;  Surgeon: Wilford Corner, MD;  Location: [facility] ENDOSCOPY;  Service: Endoscopy;;   SUBMUCOSAL INJECTION  09/22/2017   Procedure: SUBMUCOSAL INJECTION;  Surgeon: Clarene Essex, MD;  Location: [place];  Service: Endoscopy;;   SUBMUCOSAL INJECTION  12/04/2018   Procedure: SUBMUCOSAL INJECTION;  Surgeon: Wilford Corner, MD;  [facility];  Service: Endoscopy;;    I have reviewed  the social history and family history with the patient and they are unchanged from previous note.  ALLERGIES:  is allergic to feraheme [ferumoxytol], nsaids, tomato, iron (ferrous sulfate) [ferrous sulfate er], other, and wasp venom.  MEDICATIONS:  Current Outpatient Medications  Medication Sig Dispense Refill   Accu-Chek Softclix Lancets lancets Use to check blood sugar before breakfast and before dinner while on steroids (Patient taking differently: 1 each by Other route See admin instructions. Use to check blood sugar before breakfast and before dinner while on steroids) 100 each 1   acitretin (SORIATANE) 10 MG capsule Take 10 mg by mouth daily.     ALPRAZolam (XANAX) 0.5 MG tablet Take 1 tablet (0.5 mg total) by mouth at bedtime as needed for anxiety or sleep. 30 tablet 0   Aminocaproic Acid 1000 MG TABS Take 1 tablet (1,000 mg total) by mouth in the morning and at bedtime. 60 tablet 2   amLODipine (NORVASC) 10 MG tablet Take 1 tablet (10 mg total) by mouth daily. 90 tablet 3   atorvastatin (LIPITOR) 80 MG tablet TAKE 1 TABLET(80 MG) BY MOUTH DAILY 30 tablet 3   diphenoxylate-atropine (LOMOTIL) 2.5-0.025 MG tablet 1 to 2 PO QID prn diarrhea 30 tablet 1   furosemide (LASIX) 40 MG tablet Take 40 mg by mouth daily.     glucose blood (ACCU-CHEK GUIDE) test strip Check blood sugar 2 times per day while on steroids before breakfast and dinner (Patient taking differently: 1 each by Other route See admin instructions. Check blood sugar  2 times per day while on steroids before breakfast and dinner) 50 each 3   hydrochlorothiazide (HYDRODIURIL) 12.5 MG tablet TAKE 1 TABLET(12.5 MG) BY MOUTH DAILY 90 tablet 1   lidocaine-prilocaine (EMLA) cream Apply 1 application topically as needed. 30 g 0   metFORMIN (GLUCOPHAGE) 500 MG tablet TAKE 1 TABLET(500 MG) BY MOUTH TWICE DAILY WITH A MEAL 180 tablet 3   methocarbamol (ROBAXIN) 500 MG tablet Take 1 tablet (500 mg total) by mouth every 8 (eight) hours as needed  for muscle spasms (back pain/spasm). 7 tablet 0   metoprolol succinate (TOPROL-XL) 100 MG 24 hr tablet Take 100 mg by mouth daily.     pantoprazole (PROTONIX) 40 MG tablet TAKE 1 TABLET(40 MG) BY MOUTH TWICE DAILY 60 tablet 5   Podiatric Products (FLEXITOL HEEL BALM) OINT Apply 1 application topically as needed (dry skin).      potassium chloride (KLOR-CON) 20 MEQ packet Take 20 mEq by mouth daily. 30 packet 1   PROAIR HFA 108 (90 Base) MCG/ACT inhaler INHALE 1 TO 2 PUFFS INTO THE LUNGS EVERY 6 HOURS AS NEEDED FOR WHEEZING OR SHORTNESS OF BREATH 8.5 g 0   prochlorperazine (COMPAZINE) 10 MG tablet Take 1 tablet (10 mg total) by mouth every 6 (six) hours as needed for nausea or vomiting. 30 tablet 1   sertraline (ZOLOFT) 25 MG tablet Take 5 tablets (125 mg total) by mouth daily. 150 tablet 2   terbinafine (LAMISIL AT) 1 % cream Apply 1 application topically 2 (two) times daily. Both bottom and top of both feet and toes 36 g 0   traZODone (DESYREL) 100 MG tablet TAKE 1 TABLET(100 MG) BY MOUTH AT BEDTIME 30 tablet 2   No current facility-administered medications for this visit.   Facility-Administered Medications Ordered in Other Visits  Medication Dose Route Frequency Provider Last Rate Last Admin   diphenhydrAMINE (BENADRYL) 25 mg capsule            diphenhydrAMINE (BENADRYL) 25 mg capsule            phenylephrine (NEO-SYNEPHRINE) 1 % nasal drops 2 drop  2 drop Left Nare Once Truitt Merle, MD       sodium chloride flush (NS) 0.9 % injection 10 mL  10 mL Intracatheter PRN Truitt Merle, MD   10 mL at 08/14/20 1725    PHYSICAL EXAMINATION: ECOG PERFORMANCE STATUS: 3 - Symptomatic, >50% confined to bed  There were no vitals filed for this visit. Wt Readings from Last 3 Encounters:  08/30/21 229 lb 12 oz (104.2 kg)  08/17/21 227 lb (103 kg)  08/02/21 233 lb 8 oz (105.9 kg)     GENERAL:alert, no distress and comfortable SKIN: skin color normal, no rashes or significant lesions EYES: normal,  Conjunctiva are pink and non-injected, sclera clear  NEURO: alert & oriented x 3 with fluent speech  LABORATORY DATA:  I have reviewed the data as listed    Latest Ref Rng & Units 08/30/2021   11:38 AM 08/17/2021   12:45 PM 08/02/2021    1:07 PM  CBC  WBC 4.0 - 10.5 K/uL 10.4   10.5   12.3    Hemoglobin 12.0 - 15.0 g/dL 9.8   11.1   9.7    Hematocrit 36.0 - 46.0 % 31.8   35.3   32.3    Platelets 150 - 400 K/uL 268   266   263          Latest Ref Rng & Units 07/19/2021  12:46 PM 06/29/2021   12:19 PM 05/31/2021    1:12 PM  CMP  Glucose 70 - 99 mg/dL 96   94   105    BUN 6 - 20 mg/dL '10   16   11    '$ Creatinine 0.44 - 1.00 mg/dL 0.86   1.08   0.91    Sodium 135 - 145 mmol/L 141   139   141    Potassium 3.5 - 5.1 mmol/L 3.7   3.4   3.5    Chloride 98 - 111 mmol/L 107   104   108    CO2 22 - 32 mmol/L '26   26   25    '$ Calcium 8.9 - 10.3 mg/dL 9.0   9.7   8.8    Total Protein 6.5 - 8.1 g/dL 7.5   7.9   6.9    Total Bilirubin 0.3 - 1.2 mg/dL 0.2   0.3   0.2    Alkaline Phos 38 - 126 U/L 67   66   60    AST 15 - 41 U/L '12   13   11    '$ ALT 0 - 44 U/L '7   5   5        '$ RADIOGRAPHIC STUDIES: I have personally reviewed the radiological images as listed and agreed with the findings in the report. No results found.    No orders of the defined types were placed in this encounter.  All questions were answered. The patient knows to call the clinic with any problems, questions or concerns. No barriers to learning was detected. The total time spent in the appointment was 30 minutes.     Truitt Merle, MD 08/30/2021   I, Wilburn Mylar, am acting as scribe for Truitt Merle, MD.   I have reviewed the above documentation for accuracy and completeness, and I agree with the above.

## 2021-08-30 NOTE — Progress Notes (Signed)
Patient declined to stay for 30 minute post-iron observation. Vital signs stable, ambulatory.

## 2021-08-30 NOTE — Patient Instructions (Signed)
[facility]  Discharge Instructions: Thank you for choosing [facility] to provide your oncology and hematology care.   If you have a lab appointment with the [facility], please go directly to the [facility] and check in at the registration area.   Wear comfortable clothing and clothing appropriate for easy access to any Portacath or PICC line.   We strive to give you quality time with your provider. You may need to reschedule your appointment if you arrive late (15 or more minutes).  Arriving late affects you and other patients whose appointments are after yours.  Also, if you miss three or more appointments without notifying the office, you may be dismissed from the clinic at the provider's discretion.      For prescription refill requests, have your pharmacy contact our office and allow 72 hours for refills to be completed.    Today you received the following chemotherapy and/or immunotherapy agent: Zirabev, iron infusion      To help prevent nausea and vomiting after your treatment, we encourage you to take your nausea medication as directed.  BELOW ARE SYMPTOMS THAT SHOULD BE REPORTED IMMEDIATELY: *FEVER GREATER THAN 100.4 F (38 C) OR HIGHER *CHILLS OR SWEATING *NAUSEA AND VOMITING THAT IS NOT CONTROLLED WITH YOUR NAUSEA MEDICATION *UNUSUAL SHORTNESS OF BREATH *UNUSUAL BRUISING OR BLEEDING *URINARY PROBLEMS (pain or burning when urinating, or frequent urination) *BOWEL PROBLEMS (unusual diarrhea, constipation, pain near the anus) TENDERNESS IN MOUTH AND THROAT WITH OR WITHOUT PRESENCE OF ULCERS (sore throat, sores in mouth, or a toothache) UNUSUAL RASH, SWELLING OR PAIN  UNUSUAL VAGINAL DISCHARGE OR ITCHING   Items with * indicate a potential emergency and should be followed up as soon as possible or go to the Emergency Department if any problems should occur.  Please show the CHEMOTHERAPY ALERT CARD or IMMUNOTHERAPY ALERT CARD at  check-in to the Emergency Department and triage nurse.  Should you have questions after your visit or need to cancel or reschedule your appointment, please contact [facility]  Dept: [phone]  and follow the prompts.  Office hours are 8:00 a.m. to 4:30 p.m. Monday - Friday. Please note that voicemails left after 4:00 p.m. may not be returned until the following business day.  We are closed weekends and major holidays. You have access to a nurse at all times for urgent questions. Please call the main number to the clinic Dept: [phone] and follow the prompts.   For any non-urgent questions, you may also contact your provider using MyChart. We now offer e-Visits for anyone 53 and older to request care online for non-urgent symptoms. For details visit [email or web address].   Also download the MyChart app! Go to the app store, search "MyChart", open the app, select [facility], and log in with your MyChart username and password.  Due to Covid, a mask is required upon entering the hospital/clinic. If you do not have a mask, one will be given to you upon arrival. For doctor visits, patients may have 1 support person aged 37 or older with them. For treatment visits, patients cannot have anyone with them due to current Covid guidelines and our immunocompromised population.   Iron Sucrose Injection What is this medication? IRON SUCROSE (EYE ern SOO krose) treats low levels of iron (iron deficiency anemia) in people with kidney disease. Iron is a mineral that plays an important role in making red blood cells, which carry oxygen from your lungs to  the rest of your body. This medicine may be used for other purposes; ask your health care provider or pharmacist if you have questions. COMMON BRAND NAME(S): Venofer, Ferrlecit What should I tell my care team before I take this medication? They need to know if you have any of these conditions: Anemia not caused by low iron  levels Heart disease High levels of iron in the blood Kidney disease Liver disease An unusual or allergic reaction to iron, other medications, foods, dyes, or preservatives Pregnant or trying to get pregnant Breast-feeding How should I use this medication? This medication is for infusion into a vein. It is given in a hospital or clinic setting. Talk to your care team about the use of this medication in children. While this medication may be prescribed for children as young as 2 years for selected conditions, precautions do apply. Overdosage: If you think you have taken too much of this medicine contact a poison control center or emergency room at once. NOTE: This medicine is only for you. Do not share this medicine with others. What if I miss a dose? It is important not to miss your dose. Call your care team if you are unable to keep an appointment. What may interact with this medication? Do not take this medication with any of the following: Deferoxamine Dimercaprol Other iron products This medication may also interact with the following: Chloramphenicol Deferasirox This list may not describe all possible interactions. Give your health care provider a list of all the medicines, herbs, non-prescription drugs, or dietary supplements you use. Also tell them if you smoke, drink alcohol, or use illegal drugs. Some items may interact with your medicine. What should I watch for while using this medication? Visit your care team regularly. Tell your care team if your symptoms do not start to get better or if they get worse. You may need blood work done while you are taking this medication. You may need to follow a special diet. Talk to your care team. Foods that contain iron include: whole grains/cereals, dried fruits, beans, or peas, leafy green vegetables, and organ meats (liver, kidney). What side effects may I notice from receiving this medication? Side effects that you should report to your  care team as soon as possible: Allergic reactions--skin rash, itching, hives, swelling of the face, lips, tongue, or throat Low blood pressure--dizziness, feeling faint or lightheaded, blurry vision Shortness of breath Side effects that usually do not require medical attention (report to your care team if they continue or are bothersome): Flushing Headache Joint pain Muscle pain Nausea Pain, redness, or irritation at injection site This list may not describe all possible side effects. Call your doctor for medical advice about side effects. You may report side effects to FDA at 1-800-FDA-1088. Where should I keep my medication? This medication is given in a hospital or clinic and will not be stored at home. NOTE: This sheet is a summary. It may not cover all possible information. If you have questions about this medicine, talk to your doctor, pharmacist, or health care provider.  2022 Elsevier/Gold Standard (2020-08-25 00:00:00)

## 2021-08-31 ENCOUNTER — Telehealth: Payer: Self-pay | Admitting: Hematology

## 2021-08-31 ENCOUNTER — Other Ambulatory Visit (HOSPITAL_COMMUNITY): Payer: Self-pay

## 2021-08-31 NOTE — Telephone Encounter (Signed)
Left message with follow-up appointments per 5/18 los.

## 2021-09-02 ENCOUNTER — Other Ambulatory Visit: Payer: Self-pay | Admitting: Hematology

## 2021-09-04 ENCOUNTER — Telehealth: Payer: Self-pay | Admitting: Behavioral Health

## 2021-09-04 ENCOUNTER — Ambulatory Visit: Payer: Medicaid Other | Admitting: Behavioral Health

## 2021-09-04 NOTE — Telephone Encounter (Signed)
Unable to reach Pt for today's appt. Unable to lv msg, "person cannot accept calls at this time".  Pt will be r/s'd to another date/time & can [facility] to [facility] to r/s @ her convenience since dealing w[facility] Margit Banda.  Dr. Theodis Shove

## 2021-09-06 ENCOUNTER — Inpatient Hospital Stay: Payer: Medicaid Other

## 2021-09-12 ENCOUNTER — Other Ambulatory Visit: Payer: Self-pay

## 2021-09-12 ENCOUNTER — Telehealth: Payer: Self-pay

## 2021-09-12 NOTE — Telephone Encounter (Signed)
Pt called inquiring about what day her appt is scheduled on.  Informed pt that her appt is scheduled on 09/13/2021 at 11:30am.  Pt acknowledged appt date and time.

## 2021-09-13 ENCOUNTER — Inpatient Hospital Stay: Payer: Medicaid Other

## 2021-09-13 ENCOUNTER — Inpatient Hospital Stay: Payer: Medicaid Other | Attending: Hematology

## 2021-09-13 ENCOUNTER — Other Ambulatory Visit: Payer: Self-pay

## 2021-09-13 ENCOUNTER — Other Ambulatory Visit (HOSPITAL_COMMUNITY): Payer: Self-pay

## 2021-09-13 VITALS — BP 140/74 | HR 61 | Temp 98.4°F | Resp 16

## 2021-09-13 DIAGNOSIS — I780 Hereditary hemorrhagic telangiectasia: Secondary | ICD-10-CM | POA: Insufficient documentation

## 2021-09-13 DIAGNOSIS — Z95828 Presence of other vascular implants and grafts: Secondary | ICD-10-CM

## 2021-09-13 DIAGNOSIS — D500 Iron deficiency anemia secondary to blood loss (chronic): Secondary | ICD-10-CM

## 2021-09-13 DIAGNOSIS — D649 Anemia, unspecified: Secondary | ICD-10-CM

## 2021-09-13 LAB — CBC WITH DIFFERENTIAL (CANCER CENTER ONLY)
Abs Immature Granulocytes: 0.03 10*3/uL (ref 0.00–0.07)
Basophils Absolute: 0 10*3/uL (ref 0.0–0.1)
Basophils Relative: 0 %
Eosinophils Absolute: 0.2 10*3/uL (ref 0.0–0.5)
Eosinophils Relative: 2 %
HCT: 25.8 % — ABNORMAL LOW (ref 36.0–46.0)
Hemoglobin: 8.1 g/dL — ABNORMAL LOW (ref 12.0–15.0)
Immature Granulocytes: 0 %
Lymphocytes Relative: 15 %
Lymphs Abs: 1.4 10*3/uL (ref 0.7–4.0)
MCH: 27.6 pg (ref 26.0–34.0)
MCHC: 31.4 g/dL (ref 30.0–36.0)
MCV: 87.8 fL (ref 80.0–100.0)
Monocytes Absolute: 0.4 10*3/uL (ref 0.1–1.0)
Monocytes Relative: 4 %
Neutro Abs: 7.6 10*3/uL (ref 1.7–7.7)
Neutrophils Relative %: 79 %
Platelet Count: 308 10*3/uL (ref 150–400)
RBC: 2.94 MIL/uL — ABNORMAL LOW (ref 3.87–5.11)
RDW: 22.2 % — ABNORMAL HIGH (ref 11.5–15.5)
WBC Count: 9.7 10*3/uL (ref 4.0–10.5)
nRBC: 0 % (ref 0.0–0.2)

## 2021-09-13 LAB — CMP (CANCER CENTER ONLY)
ALT: 5 U/L (ref 0–44)
AST: 12 U/L — ABNORMAL LOW (ref 15–41)
Albumin: 3.7 g/dL (ref 3.5–5.0)
Alkaline Phosphatase: 57 U/L (ref 38–126)
Anion gap: 6 (ref 5–15)
BUN: 7 mg/dL (ref 6–20)
CO2: 27 mmol/L (ref 22–32)
Calcium: 9.3 mg/dL (ref 8.9–10.3)
Chloride: 109 mmol/L (ref 98–111)
Creatinine: 0.77 mg/dL (ref 0.44–1.00)
GFR, Estimated: >60 mL/min (ref >60)
Glucose, Bld: 114 mg/dL — ABNORMAL HIGH (ref 70–99)
Potassium: 3.4 mmol/L — ABNORMAL LOW (ref 3.5–5.1)
Sodium: 142 mmol/L (ref 135–145)
Total Bilirubin: 0.3 mg/dL (ref 0.3–1.2)
Total Protein: 6.8 g/dL (ref 6.5–8.1)

## 2021-09-13 LAB — FERRITIN: Ferritin: 81 ng/mL (ref 11–307)

## 2021-09-13 LAB — TOTAL PROTEIN, URINE DIPSTICK: Protein, ur: 300 mg/dL — AB

## 2021-09-13 LAB — SAMPLE TO BLOOD BANK

## 2021-09-13 MED ORDER — HEPARIN SOD (PORK) LOCK FLUSH 100 UNIT/ML IV SOLN
500.0000 [IU] | Freq: Once | INTRAVENOUS | Status: AC
  Administered 2021-09-13: 500 [IU]

## 2021-09-13 MED ORDER — SODIUM CHLORIDE 0.9 % IV SOLN
250.0000 mg | Freq: Once | INTRAVENOUS | Status: AC
  Administered 2021-09-13: 250 mg via INTRAVENOUS
  Filled 2021-09-13: qty 20

## 2021-09-13 MED ORDER — SODIUM CHLORIDE 0.9% FLUSH
10.0000 mL | Freq: Once | INTRAVENOUS | Status: AC
  Administered 2021-09-13: 10 mL

## 2021-09-13 MED ORDER — SODIUM CHLORIDE 0.9 % IV SOLN: INTRAVENOUS | Status: DC

## 2021-09-13 NOTE — Patient Instructions (Signed)
Sodium Ferric Gluconate Complex Injection What is this medication? SODIUM FERRIC GLUCONATE COMPLEX (SOE dee um FER ik GLOO koe nate KOM pleks) treats low levels of iron (iron deficiency anemia) in people with kidney disease. Iron is a mineral that plays an important role in making red blood cells, which carry oxygen from your lungs to the rest of your body. This medicine may be used for other purposes; ask your health care provider or pharmacist if you have questions. COMMON BRAND NAME(S): Ferrlecit, Nulecit What should I tell my care team before I take this medication? They need to know if you have any of the following conditions: Anemia that is not from iron deficiency High levels of iron in the blood An unusual or allergic reaction to iron, other medications, foods, dyes, or preservatives Pregnant or are trying to become pregnant Breast-feeding How should I use this medication? This medication is injected into a vein. It is given by your care team in a hospital or clinic setting. Talk to your care team about the use of this medication in children. While it may be prescribed for children as young as 6 years for selected conditions, precautions do apply. Overdosage: If you think you have taken too much of this medicine contact a poison control center or emergency room at once. NOTE: This medicine is only for you. Do not share this medicine with others. What if I miss a dose? It is important not to miss your dose. Call your care team if you are unable to keep an appointment. What may interact with this medication? Do not take this medication with any of the following: Deferasirox Deferoxamine Dimercaprol This medication may also interact with the following: Other iron products This list may not describe all possible interactions. Give your health care provider a list of all the medicines, herbs, non-prescription drugs, or dietary supplements you use. Also tell them if you smoke, drink  alcohol, or use illegal drugs. Some items may interact with your medicine. What should I watch for while using this medication? Your condition will be monitored carefully while you are receiving this medication. Visit your care team for regular checks on your progress. You may need blood work while you are taking this medication. What side effects may I notice from receiving this medication? Side effects that you should report to your care team as soon as possible: Allergic reactions--skin rash, itching, hives, swelling of the face, lips, tongue, or throat Low blood pressure--dizziness, feeling faint or lightheaded, blurry vision Shortness of breath Side effects that usually do not require medical attention (report to your care team if they continue or are bothersome): Flushing Headache Joint pain Muscle pain Nausea Pain, redness, or irritation at injection site This list may not describe all possible side effects. Call your doctor for medical advice about side effects. You may report side effects to FDA at 1-800-FDA-1088. Where should I keep my medication? This medication is given in a hospital or clinic and will not be stored at home. NOTE: This sheet is a summary. It may not cover all possible information. If you have questions about this medicine, talk to your doctor, pharmacist, or health care provider.  2023 Elsevier/Gold Standard (2020-08-25 00:00:00)  

## 2021-09-13 NOTE — Progress Notes (Signed)
Per treatment parameters, bevacizumab held today due to urine protein 300.   Will recheck urine protein next week.  Pt verbalized understanding.

## 2021-09-14 ENCOUNTER — Other Ambulatory Visit: Payer: Self-pay | Admitting: Nurse Practitioner

## 2021-09-16 ENCOUNTER — Encounter: Payer: Self-pay | Admitting: Hematology

## 2021-09-18 ENCOUNTER — Telehealth: Payer: Self-pay

## 2021-09-18 NOTE — Telephone Encounter (Signed)
Pt called stating someone from [facility] called her.  Asked pt did they leave a voicemail.  Pt stated there was no voicemail left on her telephone.  Informed pt that this RN did not contact her but reminded her that she does have appointments scheduled on 09/20/2021 and it could have been a reminder called for appts.  Pt verbalized understanding and confirmed her appts scheduled on 09/20/2021.

## 2021-09-20 ENCOUNTER — Inpatient Hospital Stay: Payer: Medicaid Other

## 2021-09-27 ENCOUNTER — Inpatient Hospital Stay: Payer: Medicaid Other

## 2021-10-01 ENCOUNTER — Telehealth: Payer: Self-pay | Admitting: Behavioral Health

## 2021-10-01 ENCOUNTER — Ambulatory Visit: Payer: Medicaid Other | Admitting: Behavioral Health

## 2021-10-01 NOTE — Telephone Encounter (Signed)
Lft msg for Pt re: today's [facility] appt. Encouraged Pt to [facility] to [facility] & r/s @ her convenience.  Dr. Theodis Shove

## 2021-10-03 DIAGNOSIS — N319 Neuromuscular dysfunction of bladder, unspecified: Secondary | ICD-10-CM | POA: Diagnosis not present

## 2021-10-03 DIAGNOSIS — N3941 Urge incontinence: Secondary | ICD-10-CM | POA: Diagnosis not present

## 2021-10-04 ENCOUNTER — Inpatient Hospital Stay: Payer: Medicaid Other

## 2021-10-05 ENCOUNTER — Inpatient Hospital Stay: Payer: Medicaid Other

## 2021-10-07 ENCOUNTER — Encounter: Payer: Self-pay | Admitting: *Deleted

## 2021-10-08 ENCOUNTER — Telehealth: Payer: Self-pay | Admitting: Behavioral Health

## 2021-10-08 ENCOUNTER — Ambulatory Visit: Payer: Medicaid Other | Admitting: Behavioral Health

## 2021-10-08 NOTE — Telephone Encounter (Signed)
Lft Pt msg to [facility] to [facility] & r/s with New Provider @ her convenience. Have not been able to reach Pt in several past calls.   Dr. Theodis Shove

## 2021-10-11 ENCOUNTER — Encounter: Payer: Medicaid Other | Admitting: Student

## 2021-10-11 ENCOUNTER — Inpatient Hospital Stay: Payer: Medicaid Other | Admitting: Hematology

## 2021-10-11 ENCOUNTER — Inpatient Hospital Stay: Payer: Medicaid Other

## 2021-10-11 ENCOUNTER — Telehealth: Payer: Self-pay

## 2021-10-11 DIAGNOSIS — I780 Hereditary hemorrhagic telangiectasia: Secondary | ICD-10-CM

## 2021-10-11 DIAGNOSIS — D500 Iron deficiency anemia secondary to blood loss (chronic): Secondary | ICD-10-CM

## 2021-10-11 DIAGNOSIS — K920 Hematemesis: Secondary | ICD-10-CM

## 2021-10-11 NOTE — Telephone Encounter (Signed)
Attempted to contact pt via telephone but call was not answered.  "NO Showed" pt's appts.

## 2021-10-18 ENCOUNTER — Other Ambulatory Visit: Payer: Self-pay

## 2021-10-18 ENCOUNTER — Inpatient Hospital Stay: Payer: Medicaid Other | Attending: Hematology

## 2021-10-18 ENCOUNTER — Inpatient Hospital Stay: Payer: Medicaid Other

## 2021-10-18 VITALS — BP 125/51 | HR 68 | Temp 98.0°F | Resp 16 | Wt 229.1 lb

## 2021-10-18 DIAGNOSIS — I780 Hereditary hemorrhagic telangiectasia: Secondary | ICD-10-CM

## 2021-10-18 DIAGNOSIS — Z95828 Presence of other vascular implants and grafts: Secondary | ICD-10-CM

## 2021-10-18 DIAGNOSIS — D649 Anemia, unspecified: Secondary | ICD-10-CM

## 2021-10-18 DIAGNOSIS — Q2733 Arteriovenous malformation of digestive system vessel: Secondary | ICD-10-CM

## 2021-10-18 DIAGNOSIS — Z5112 Encounter for antineoplastic immunotherapy: Secondary | ICD-10-CM | POA: Insufficient documentation

## 2021-10-18 DIAGNOSIS — D500 Iron deficiency anemia secondary to blood loss (chronic): Secondary | ICD-10-CM | POA: Insufficient documentation

## 2021-10-18 DIAGNOSIS — Z79899 Other long term (current) drug therapy: Secondary | ICD-10-CM | POA: Insufficient documentation

## 2021-10-18 LAB — CBC WITH DIFFERENTIAL (CANCER CENTER ONLY)
Abs Immature Granulocytes: 0.04 10*3/uL (ref 0.00–0.07)
Basophils Absolute: 0 10*3/uL (ref 0.0–0.1)
Basophils Relative: 0 %
Eosinophils Absolute: 0.1 10*3/uL (ref 0.0–0.5)
Eosinophils Relative: 1 %
HCT: 24.9 % — ABNORMAL LOW (ref 36.0–46.0)
Hemoglobin: 7.6 g/dL — ABNORMAL LOW (ref 12.0–15.0)
Immature Granulocytes: 0 %
Lymphocytes Relative: 16 %
Lymphs Abs: 1.7 10*3/uL (ref 0.7–4.0)
MCH: 25.8 pg — ABNORMAL LOW (ref 26.0–34.0)
MCHC: 30.5 g/dL (ref 30.0–36.0)
MCV: 84.4 fL (ref 80.0–100.0)
Monocytes Absolute: 0.6 10*3/uL (ref 0.1–1.0)
Monocytes Relative: 6 %
Neutro Abs: 8.6 10*3/uL — ABNORMAL HIGH (ref 1.7–7.7)
Neutrophils Relative %: 77 %
Platelet Count: 364 10*3/uL (ref 150–400)
RBC: 2.95 MIL/uL — ABNORMAL LOW (ref 3.87–5.11)
RDW: 20.3 % — ABNORMAL HIGH (ref 11.5–15.5)
WBC Count: 11.1 10*3/uL — ABNORMAL HIGH (ref 4.0–10.5)
nRBC: 0 % (ref 0.0–0.2)

## 2021-10-18 LAB — FERRITIN: Ferritin: 25 ng/mL (ref 11–307)

## 2021-10-18 LAB — SAMPLE TO BLOOD BANK

## 2021-10-18 LAB — TOTAL PROTEIN, URINE DIPSTICK: Protein, ur: 100 mg/dL — AB

## 2021-10-18 MED ORDER — SODIUM CHLORIDE 0.9 % IV SOLN
5.0000 mg/kg | Freq: Once | INTRAVENOUS | Status: AC
  Administered 2021-10-18: 500 mg via INTRAVENOUS
  Filled 2021-10-18: qty 4

## 2021-10-18 MED ORDER — SODIUM CHLORIDE 0.9 % IV SOLN: Freq: Once | INTRAVENOUS | Status: AC

## 2021-10-18 MED ORDER — SODIUM CHLORIDE 0.9% FLUSH
10.0000 mL | INTRAVENOUS | Status: DC | PRN
  Administered 2021-10-18: 10 mL

## 2021-10-18 MED ORDER — SODIUM CHLORIDE 0.9% FLUSH
10.0000 mL | Freq: Once | INTRAVENOUS | Status: AC
  Administered 2021-10-18: 10 mL

## 2021-10-18 MED ORDER — SODIUM CHLORIDE 0.9 % IV SOLN
250.0000 mg | Freq: Once | INTRAVENOUS | Status: AC
  Administered 2021-10-18: 250 mg via INTRAVENOUS
  Filled 2021-10-18: qty 20

## 2021-10-18 MED ORDER — HEPARIN SOD (PORK) LOCK FLUSH 100 UNIT/ML IV SOLN
500.0000 [IU] | Freq: Once | INTRAVENOUS | Status: AC | PRN
  Administered 2021-10-18: 500 [IU]

## 2021-10-18 NOTE — Progress Notes (Signed)
OK to treat w/ Urine prot = 100 per Dr. Burr Medico.  Kennith Center, Pharm.D., CPP 10/18/2021'@2'$ :13 PM

## 2021-10-18 NOTE — Patient Instructions (Signed)
[facility]  Discharge Instructions: Thank you for choosing [facility] to provide your oncology and hematology care.   If you have a lab appointment with the Cancer Center, please go directly to the Cancer Center and check in at the registration area.   Wear comfortable clothing and clothing appropriate for easy access to any Portacath or PICC line.   We strive to give you quality time with your provider. You may need to reschedule your appointment if you arrive late (15 or more minutes).  Arriving late affects you and other patients whose appointments are after yours.  Also, if you miss three or more appointments without notifying the office, you may be dismissed from the clinic at the provider's discretion.      For prescription refill requests, have your pharmacy contact our office and allow 72 hours for refills to be completed.    Today you received the following chemotherapy and/or immunotherapy agents bevacizumab.      To help prevent nausea and vomiting after your treatment, we encourage you to take your nausea medication as directed.  BELOW ARE SYMPTOMS THAT SHOULD BE REPORTED IMMEDIATELY: *FEVER GREATER THAN 100.4 F (38 C) OR HIGHER *CHILLS OR SWEATING *NAUSEA AND VOMITING THAT IS NOT CONTROLLED WITH YOUR NAUSEA MEDICATION *UNUSUAL SHORTNESS OF BREATH *UNUSUAL BRUISING OR BLEEDING *URINARY PROBLEMS (pain or burning when urinating, or frequent urination) *BOWEL PROBLEMS (unusual diarrhea, constipation, pain near the anus) TENDERNESS IN MOUTH AND THROAT WITH OR WITHOUT PRESENCE OF ULCERS (sore throat, sores in mouth, or a toothache) UNUSUAL RASH, SWELLING OR PAIN  UNUSUAL VAGINAL DISCHARGE OR ITCHING   Items with * indicate a potential emergency and should be followed up as soon as possible or go to the Emergency Department if any problems should occur.  Please show the CHEMOTHERAPY ALERT CARD or IMMUNOTHERAPY ALERT CARD at check-in  to the Emergency Department and triage nurse.  Should you have questions after your visit or need to cancel or reschedule your appointment, please contact [facility]  Dept: [phone]  and follow the prompts.  Office hours are 8:00 a.m. to 4:30 p.m. Monday - Friday. Please note that voicemails left after 4:00 p.m. may not be returned until the following business day.  We are closed weekends and major holidays. You have access to a nurse at all times for urgent questions. Please call the main number to the clinic Dept: [phone] and follow the prompts.   For any non-urgent questions, you may also contact your provider using MyChart. We now offer e-Visits for anyone 18 and older to request care online for non-urgent symptoms. For details visit [email or web address].   Also download the MyChart app! Go to the app store, search "MyChart", open the app, select [facility], and log in with your MyChart username and password.  Masks are optional in the cancer centers. If you would like for your care team to wear a mask while they are taking care of you, please let them know. For doctor visits, patients may have with them one support person who is at least 61 years old. At this time, visitors are not allowed in the infusion area. 

## 2021-10-19 ENCOUNTER — Encounter: Payer: Medicaid Other | Admitting: Student

## 2021-10-19 ENCOUNTER — Telehealth: Payer: Self-pay | Admitting: Hematology

## 2021-10-19 NOTE — Assessment & Plan Note (Deleted)
Patient's last A1c was 5.0. This was taken last year.   Plan: -Obtain A1c today

## 2021-10-19 NOTE — Assessment & Plan Note (Deleted)
Patient's most recent CMP showing mildly decreased potassium at 3.4.

## 2021-10-19 NOTE — Telephone Encounter (Signed)
Scheduled follow-up appointments per 5/18 los. Patient is aware. Mailed calendar.

## 2021-10-25 ENCOUNTER — Inpatient Hospital Stay: Payer: Medicaid Other

## 2021-10-29 ENCOUNTER — Other Ambulatory Visit: Payer: Self-pay

## 2021-11-01 ENCOUNTER — Inpatient Hospital Stay: Payer: Medicaid Other | Admitting: Hematology

## 2021-11-01 ENCOUNTER — Inpatient Hospital Stay: Payer: Medicaid Other

## 2021-11-01 DIAGNOSIS — D500 Iron deficiency anemia secondary to blood loss (chronic): Secondary | ICD-10-CM

## 2021-11-01 DIAGNOSIS — I780 Hereditary hemorrhagic telangiectasia: Secondary | ICD-10-CM

## 2021-11-03 ENCOUNTER — Other Ambulatory Visit: Payer: Self-pay | Admitting: Hematology

## 2021-11-04 ENCOUNTER — Encounter: Payer: Self-pay | Admitting: Hematology

## 2021-11-07 DIAGNOSIS — N319 Neuromuscular dysfunction of bladder, unspecified: Secondary | ICD-10-CM | POA: Diagnosis not present

## 2021-11-07 DIAGNOSIS — N3941 Urge incontinence: Secondary | ICD-10-CM | POA: Diagnosis not present

## 2021-11-08 ENCOUNTER — Inpatient Hospital Stay: Payer: Medicaid Other

## 2021-11-08 ENCOUNTER — Inpatient Hospital Stay: Payer: Medicaid Other | Admitting: Hematology

## 2021-11-15 ENCOUNTER — Telehealth: Payer: Self-pay | Admitting: Hematology

## 2021-11-15 NOTE — Telephone Encounter (Signed)
Scheduled appointment per patients request.Patient was scheduled for 11/28/21 at 0945. Scheduler called [phone] and the number could not be completed as dialed X3. Called the patients son and there was no answer. Will try to follow up again.

## 2021-11-21 ENCOUNTER — Telehealth: Payer: Self-pay | Admitting: *Deleted

## 2021-11-21 ENCOUNTER — Encounter: Payer: Self-pay | Admitting: Student

## 2021-11-21 ENCOUNTER — Ambulatory Visit (INDEPENDENT_AMBULATORY_CARE_PROVIDER_SITE_OTHER): Payer: Medicaid Other | Admitting: Internal Medicine

## 2021-11-21 ENCOUNTER — Other Ambulatory Visit: Payer: Self-pay

## 2021-11-21 ENCOUNTER — Encounter: Payer: Self-pay | Admitting: Internal Medicine

## 2021-11-21 VITALS — BP 123/59 | HR 79 | Temp 98.3°F | Ht 65.5 in | Wt 230.5 lb

## 2021-11-21 DIAGNOSIS — F331 Major depressive disorder, recurrent, moderate: Secondary | ICD-10-CM

## 2021-11-21 DIAGNOSIS — D649 Anemia, unspecified: Secondary | ICD-10-CM

## 2021-11-21 DIAGNOSIS — D500 Iron deficiency anemia secondary to blood loss (chronic): Secondary | ICD-10-CM | POA: Diagnosis not present

## 2021-11-21 LAB — CBC
HCT: 20.6 % — ABNORMAL LOW (ref 36.0–46.0)
Hemoglobin: 5.9 g/dL — CL (ref 12.0–15.0)
MCH: 23.9 pg — ABNORMAL LOW (ref 26.0–34.0)
MCHC: 28.6 g/dL — ABNORMAL LOW (ref 30.0–36.0)
MCV: 83.4 fL (ref 80.0–100.0)
Platelets: 250 10*3/uL (ref 150–400)
RBC: 2.47 MIL/uL — ABNORMAL LOW (ref 3.87–5.11)
RDW: 21.1 % — ABNORMAL HIGH (ref 11.5–15.5)
WBC: 11.5 10*3/uL — ABNORMAL HIGH (ref 4.0–10.5)
nRBC: 0 % (ref 0.0–0.2)

## 2021-11-21 MED ORDER — SERTRALINE HCL 100 MG PO TABS
100.0000 mg | ORAL_TABLET | Freq: Every day | ORAL | 2 refills | Status: DC
Start: 2021-11-21 — End: 2022-06-20

## 2021-11-21 MED ORDER — METFORMIN HCL 500 MG PO TABS: ORAL_TABLET | ORAL | 3 refills | Status: DC

## 2021-11-21 NOTE — Telephone Encounter (Signed)
Received a call from [facility] lab with critical lab value. Pt's Hgb is 5.9 - reported to Dr Marlou Sa.

## 2021-11-21 NOTE — Progress Notes (Unsigned)
CC: Black stool, depression  HPI:  [name] is a 61 y.o. patient with past medical history as detailed below who presents today with the complaint of black stools and depression.  Please see problem charting for detail assessment and plan.  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  Physical Exam:  Vitals:   11/21/21 1504  BP: (!) 123/59  Pulse: 79  Temp: 98.3 F (36.8 C)  TempSrc: Oral  SpO2: 100%  Weight: 230 lb 8 oz (104.6 kg)  Height: 5' 5.5" (1.664 m)   Constitutional: Tired appearing, appears older than stated age.  In no acute distress. Cardio: Regular rate and rhythm. Pulm: Clear to auscultation bilaterally.  Normal work of breathing on room air. Abdomen: Tenderness to palpation to right of epigastrium. MSK: Negative for extremity edema. Skin: Warm and dry with paleness of the mucosa. Neuro: Alert and oriented x 3.  No focal deficit noted. Psych: Normal mood and affect.  Assessment & Plan:   See Encounters Tab for problem based charting.  Anemia due to GI blood loss Patient presents today with the complaint of several black stools beginning on Sunday with most recent episode last night.  She has not had any bowel movements today and is unable to comment on continued episodes today.  She reports that she has not had much energy over the last 1 week and has had some shortness of breath at this time as well.  She has been nauseous but no vomiting or hematemesis.  She denies palpitations or fluttering.  Her last colonoscopy was in 2021 and some polyps were  removed at that time.  Around this time last year she experienced upper GI bleed and at that time her presenting symptoms were similar to today.  She received her last blood transfusion about 1 month ago and states that she did feel somewhat better until 1 week ago.  She is not sure if she is still taking Protonix.  She states that she has previously taken 1-2 bottles of ibuprofen in the last month for her knee pain.  She is having pain immediately to the right of her epigastrium.  She is somewhat dizzy but has not had any syncopal episodes. Assessment: Given patient's history of upper GI bleed and hereditary hemorrhagic telangiectasia I am concerned that her clinical presentation is due to recurrent upper GI bleed. Plan: Stat CBC ordered and showed hemoglobin of 5.9.  Patient called and advised to report to the emergency department for further evaluation and possible admission to which she agreed.  Major depressive disorder, recurrent episode [facility]) Patient reports concerns regarding untreated depression today.  Her PHQ-9 was elevated at 22 today.  On review of her medications it appears that she is taking a very low-dose of sertraline.  At her last visit for depression an additional 25 mg pill was sent in for her to take alongside her previous dose of 100 mg, however at this time she is only taking the 25 mg pill. Assessment: I am concerned that there is confusion regarding  which dose the patient should be taking for her depression.  If she is only taking 25 mg that she is undertreated at this time. Plan: New prescription for sertraline 100 mg daily sent in.  Patient discussed with Dr. Heber Rockland

## 2021-11-21 NOTE — Patient Instructions (Signed)
Ms. Stenerson,  I have ordered a rapid CBC to check your blood counts. I am worried that you are bleeding somewhere in your digestive tract based on your symptoms. If these results are low I will call you to go to the emergency department.  I have refilled your sertraline and metformin in the meantime. I would like you to come back to clinic in 1-2 weeks for general follow-up as you have not been seen in our clinic in quite some time.  My best, Dr. Marlou Sa

## 2021-11-21 NOTE — H&P (Signed)
Entered in error

## 2021-11-22 ENCOUNTER — Telehealth: Payer: Self-pay | Admitting: Internal Medicine

## 2021-11-22 NOTE — Assessment & Plan Note (Signed)
Patient presents today with the complaint of several black stools beginning on Sunday with most recent episode last night.  She has not had any bowel movements today and is unable to comment on continued episodes today.  She reports that she has not had much energy over the last 1 week and has had some shortness of breath at this time as well.  She has been nauseous but no vomiting or hematemesis.  She denies palpitations or fluttering.  Her last colonoscopy was in 2021 and some polyps were removed at that time.  Around this time last year she experienced upper GI bleed and at that time her presenting symptoms were similar to today.  She received her last blood transfusion about 1 month ago and states that she did feel somewhat better until 1 week ago.  She is not sure if she is still taking Protonix.  She states that she has previously taken 1-2 bottles of ibuprofen in the last month for her knee pain.  She is having pain immediately to the right of her epigastrium.  She is somewhat dizzy but has not had any syncopal episodes. Assessment: Given patient's history of upper GI bleed and hereditary hemorrhagic telangiectasia I am concerned that her clinical presentation is due to recurrent upper GI bleed. Plan: Stat CBC ordered and showed hemoglobin of 5.9.  Patient called and advised to report to the emergency department for further evaluation and possible admission to which she agreed.

## 2021-11-22 NOTE — Assessment & Plan Note (Signed)
Patient reports concerns regarding untreated depression today.  Her PHQ-9 was elevated at 22 today.  On review of her medications it appears that she is taking a very low-dose of sertraline.  At her last visit for depression an additional 25 mg pill was sent in for her to take alongside her previous dose of 100 mg, however at this time she is only taking the 25 mg pill. Assessment: I am concerned that there is confusion regarding which dose the patient should be taking for her depression.  If she is only taking 25 mg that she is undertreated at this time. Plan: New prescription for sertraline 100 mg daily sent in.

## 2021-11-22 NOTE — Telephone Encounter (Signed)
Attempted to contact patient as after speaking with her around 5 PM yesterday 08/09 she did not report to the emergency department as she was advised and agreed on her home call.  Her hemoglobin is quite low at 5.9 and I am concerned that she is experiencing recurrent GI bleed necessitating urgent evaluation.  I left a voice message today reiterating the need to report to the emergency department and we will attempt to contact her again later today.  Farrel Gordon, DO

## 2021-11-23 ENCOUNTER — Emergency Department (HOSPITAL_COMMUNITY)
Admission: EM | Admit: 2021-11-23 | Discharge: 2021-11-24 | Discharge status: Against Medical Advice | Payer: Medicaid Other

## 2021-11-23 NOTE — Progress Notes (Signed)
Internal Medicine Clinic Attending  Case discussed with the resident at the time of the visit.  We reviewed the resident's history and exam and pertinent patient test results.  I agree with the assessment, diagnosis, and plan of care documented in the resident's note.  

## 2021-11-23 NOTE — ED Notes (Signed)
Pt called for triage, no response. Not seen in lobby

## 2021-11-23 NOTE — ED Notes (Signed)
Pts name called names 3x in lobby with no response

## 2021-11-28 ENCOUNTER — Encounter: Payer: Self-pay | Admitting: Hematology

## 2021-11-28 ENCOUNTER — Inpatient Hospital Stay (HOSPITAL_BASED_OUTPATIENT_CLINIC_OR_DEPARTMENT_OTHER): Payer: Medicaid Other | Admitting: Hematology

## 2021-11-28 ENCOUNTER — Other Ambulatory Visit: Payer: Self-pay

## 2021-11-28 ENCOUNTER — Inpatient Hospital Stay: Payer: Medicaid Other | Attending: Hematology

## 2021-11-28 ENCOUNTER — Telehealth: Payer: Self-pay

## 2021-11-28 ENCOUNTER — Inpatient Hospital Stay: Payer: Medicaid Other

## 2021-11-28 VITALS — BP 135/73 | HR 61 | Temp 98.4°F | Resp 18

## 2021-11-28 VITALS — BP 124/59 | HR 85 | Temp 98.6°F | Wt 232.5 lb

## 2021-11-28 DIAGNOSIS — K922 Gastrointestinal hemorrhage, unspecified: Secondary | ICD-10-CM | POA: Insufficient documentation

## 2021-11-28 DIAGNOSIS — D649 Anemia, unspecified: Secondary | ICD-10-CM

## 2021-11-28 DIAGNOSIS — D500 Iron deficiency anemia secondary to blood loss (chronic): Secondary | ICD-10-CM

## 2021-11-28 DIAGNOSIS — Z5112 Encounter for antineoplastic immunotherapy: Secondary | ICD-10-CM | POA: Diagnosis present

## 2021-11-28 DIAGNOSIS — Q2733 Arteriovenous malformation of digestive system vessel: Secondary | ICD-10-CM

## 2021-11-28 DIAGNOSIS — Z79899 Other long term (current) drug therapy: Secondary | ICD-10-CM | POA: Insufficient documentation

## 2021-11-28 DIAGNOSIS — I780 Hereditary hemorrhagic telangiectasia: Secondary | ICD-10-CM | POA: Diagnosis not present

## 2021-11-28 DIAGNOSIS — Z95828 Presence of other vascular implants and grafts: Secondary | ICD-10-CM

## 2021-11-28 LAB — CBC WITH DIFFERENTIAL (CANCER CENTER ONLY)
Abs Immature Granulocytes: 0.06 10*3/uL (ref 0.00–0.07)
Basophils Absolute: 0 10*3/uL (ref 0.0–0.1)
Basophils Relative: 0 %
Eosinophils Absolute: 0.1 10*3/uL (ref 0.0–0.5)
Eosinophils Relative: 1 %
HCT: 16.8 % — ABNORMAL LOW (ref 36.0–46.0)
Hemoglobin: 4.8 g/dL — CL (ref 12.0–15.0)
Immature Granulocytes: 1 %
Lymphocytes Relative: 13 %
Lymphs Abs: 1.6 10*3/uL (ref 0.7–4.0)
MCH: 23.1 pg — ABNORMAL LOW (ref 26.0–34.0)
MCHC: 28.6 g/dL — ABNORMAL LOW (ref 30.0–36.0)
MCV: 80.8 fL (ref 80.0–100.0)
Monocytes Absolute: 0.5 10*3/uL (ref 0.1–1.0)
Monocytes Relative: 5 %
Neutro Abs: 9.8 10*3/uL — ABNORMAL HIGH (ref 1.7–7.7)
Neutrophils Relative %: 80 %
Platelet Count: 355 10*3/uL (ref 150–400)
RBC: 2.08 MIL/uL — ABNORMAL LOW (ref 3.87–5.11)
RDW: 21.3 % — ABNORMAL HIGH (ref 11.5–15.5)
WBC Count: 12.1 10*3/uL — ABNORMAL HIGH (ref 4.0–10.5)
nRBC: 0.2 % (ref 0.0–0.2)

## 2021-11-28 LAB — SAMPLE TO BLOOD BANK

## 2021-11-28 LAB — FERRITIN: Ferritin: 7 ng/mL — ABNORMAL LOW (ref 11–307)

## 2021-11-28 LAB — PREPARE RBC (CROSSMATCH)

## 2021-11-28 LAB — TOTAL PROTEIN, URINE DIPSTICK: Protein, ur: 30 mg/dL — AB

## 2021-11-28 MED ORDER — DIPHENHYDRAMINE HCL 25 MG PO CAPS
ORAL_CAPSULE | ORAL | Status: AC
  Filled 2021-11-28: qty 1

## 2021-11-28 MED ORDER — SODIUM CHLORIDE 0.9 % IV SOLN: 250.0000 mg | Freq: Once | INTRAVENOUS | Status: DC

## 2021-11-28 MED ORDER — SODIUM CHLORIDE 0.9 % IV SOLN
5.0000 mg/kg | Freq: Once | INTRAVENOUS | Status: AC
  Administered 2021-11-28: 500 mg via INTRAVENOUS
  Filled 2021-11-28: qty 16

## 2021-11-28 MED ORDER — SODIUM CHLORIDE 0.9 % IV SOLN: Freq: Once | INTRAVENOUS | Status: AC

## 2021-11-28 MED ORDER — HEPARIN SOD (PORK) LOCK FLUSH 100 UNIT/ML IV SOLN
500.0000 [IU] | Freq: Once | INTRAVENOUS | Status: AC | PRN
  Administered 2021-11-28: 500 [IU]

## 2021-11-28 MED ORDER — SODIUM CHLORIDE 0.9 % IV SOLN
250.0000 mg | Freq: Once | INTRAVENOUS | Status: AC
  Administered 2021-11-28: 250 mg via INTRAVENOUS
  Filled 2021-11-28: qty 20

## 2021-11-28 MED ORDER — ACETAMINOPHEN 325 MG PO TABS
ORAL_TABLET | ORAL | Status: AC
  Filled 2021-11-28: qty 2

## 2021-11-28 MED ORDER — SODIUM CHLORIDE 0.9% FLUSH
10.0000 mL | Freq: Once | INTRAVENOUS | Status: AC
  Administered 2021-11-28: 10 mL

## 2021-11-28 MED ORDER — ACETAMINOPHEN 325 MG PO TABS
650.0000 mg | ORAL_TABLET | Freq: Once | ORAL | Status: AC
  Administered 2021-11-28: 650 mg via ORAL

## 2021-11-28 MED ORDER — SODIUM CHLORIDE 0.9% FLUSH
10.0000 mL | INTRAVENOUS | Status: DC | PRN
  Administered 2021-11-28: 10 mL

## 2021-11-28 MED ORDER — SODIUM CHLORIDE 0.9% IV SOLUTION
250.0000 mL | Freq: Once | INTRAVENOUS | Status: AC
  Administered 2021-11-28: 250 mL via INTRAVENOUS

## 2021-11-28 MED ORDER — DIPHENHYDRAMINE HCL 25 MG PO CAPS
25.0000 mg | ORAL_CAPSULE | Freq: Once | ORAL | Status: AC
  Administered 2021-11-28: 25 mg via ORAL

## 2021-11-28 NOTE — Patient Instructions (Addendum)
[facility]  Discharge Instructions: Thank you for choosing [facility] to provide your oncology and hematology care.   If you have a lab appointment with the [facility], please go directly to the [facility] and check in at the registration area.   Wear comfortable clothing and clothing appropriate for easy access to any Portacath or PICC line.   We strive to give you quality time with your provider. You may need to reschedule your appointment if you arrive late (15 or more minutes).  Arriving late affects you and other patients whose appointments are after yours.  Also, if you miss three or more appointments without notifying the office, you may be dismissed from the clinic at the provider's discretion.      For prescription refill requests, have your pharmacy contact our office and allow 72 hours for refills to be completed.    Today you received the following chemotherapy and/or immunotherapy agents: Bevacizumab.       To help prevent nausea and vomiting after your treatment, we encourage you to take your nausea medication as directed.  BELOW ARE SYMPTOMS THAT SHOULD BE REPORTED IMMEDIATELY: *FEVER GREATER THAN 100.4 F (38 C) OR HIGHER *CHILLS OR SWEATING *NAUSEA AND VOMITING THAT IS NOT CONTROLLED WITH YOUR NAUSEA MEDICATION *UNUSUAL SHORTNESS OF BREATH *UNUSUAL BRUISING OR BLEEDING *URINARY PROBLEMS (pain or burning when urinating, or frequent urination) *BOWEL PROBLEMS (unusual diarrhea, constipation, pain near the anus) TENDERNESS IN MOUTH AND THROAT WITH OR WITHOUT PRESENCE OF ULCERS (sore throat, sores in mouth, or a toothache) UNUSUAL RASH, SWELLING OR PAIN  UNUSUAL VAGINAL DISCHARGE OR ITCHING   Items with * indicate a potential emergency and should be followed up as soon as possible or go to the Emergency Department if any problems should occur.  Please show the CHEMOTHERAPY ALERT CARD or IMMUNOTHERAPY ALERT CARD at check-in  to the Emergency Department and triage nurse.  Should you have questions after your visit or need to cancel or reschedule your appointment, please contact [facility]  Dept: [phone]  and follow the prompts.  Office hours are 8:00 a.m. to 4:30 p.m. Monday - Friday. Please note that voicemails left after 4:00 p.m. may not be returned until the following business day.  We are closed weekends and major holidays. You have access to a nurse at all times for urgent questions. Please call the main number to the clinic Dept: [phone] and follow the prompts.   For any non-urgent questions, you may also contact your provider using MyChart. We now offer e-Visits for anyone 55 and older to request care online for non-urgent symptoms. For details visit [email or web address].   Also download the MyChart app! Go to the app store, search "MyChart", open the app, select [facility], and log in with your MyChart username and password.  Masks are optional in the cancer centers. If you would like for your care team to wear a mask while they are taking care of you, please let them know. You may have one support person who is at least 61 years old accompany you for your appointments. Blood Transfusion, Adult, Care After The following information offers guidance on how to care for yourself after your procedure. Your health care provider may also give you more specific instructions. If you have problems or questions, contact your health care provider. What can I expect after the procedure? After the procedure, it is common to have: Bruising and soreness where the  IV was inserted. A headache. Follow these instructions at home: IV insertion site care     Follow instructions from your health care provider about how to take care of your IV insertion site. Make sure you: Wash your hands with soap and water for at least 20 seconds before and after you change your bandage (dressing).  If soap and water are not available, use hand sanitizer. Change your dressing as told by your health care provider. Check your IV insertion site every day for signs of infection. Check for: Redness, swelling, or pain. Bleeding from the site. Warmth. Pus or a bad smell. General instructions Take over-the-counter and prescription medicines only as told by your health care provider. Rest as told by your health care provider. Return to your normal activities as told by your health care provider. Keep all follow-up visits. Lab tests may need to be done at certain periods to recheck your blood counts. Contact a health care provider if: You have itching or red, swollen areas of skin (hives). You have a fever or chills. You have pain in the head, back, or chest. You feel anxious or you feel weak after doing your normal activities. You have redness, swelling, warmth, or pain around the IV insertion site. You have blood coming from the IV insertion site that does not stop with pressure. You have pus or a bad smell coming from your IV insertion site. If you received your blood transfusion in an outpatient setting, you will be told whom to contact to report any reactions. Get help right away if: You have symptoms of a serious allergic or immune system reaction, including: Trouble breathing or shortness of breath. Swelling of the face, feeling flushed, or widespread rash. Dark urine or blood in the urine. Fast heartbeat. These symptoms may be an emergency. Get help right away. Call 911. Do not wait to see if the symptoms will go away. Do not drive yourself to the hospital. Summary Bruising and soreness around the IV insertion site are common. Check your IV insertion site every day for signs of infection. Rest as told by your health care provider. Return to your normal activities as told by your health care provider. Get help right away for symptoms of a serious allergic or immune system reaction  to the blood transfusion. This information is not intended to replace advice given to you by your health care provider. Make sure you discuss any questions you have with your health care provider. Document Revised: 06/29/2021 Document Reviewed: 06/29/2021 Elsevier Patient Education  [place]. Ferric Carboxymaltose Injection What is this medication? FERRIC CARBOXYMALTOSE (FER ik kar BOX ee MAWL tose) treats low levels of iron in your body (iron deficiency anemia). Iron is a mineral that plays an important role in making red blood cells, which carry oxygen from your lungs to the rest of your body. This medicine may be used for other purposes; ask your health care provider or pharmacist if you have questions. COMMON BRAND NAME(S): Injectafer What should I tell my care team before I take this medication? They need to know if you have any of these conditions: High blood pressure High levels of iron in the blood An unusual or allergic reaction to iron, other medications, foods, dyes, or preservatives Pregnant or trying to get pregnant Breast-feeding How should I use this medication? This medication is injected into a vein. It is given by your care team in a hospital or clinic setting. Talk to your care team about the  use of this medication in children. While it may be given to children as young as 1 year for selected conditions, precautions do apply. Overdosage: If you think you have taken too much of this medicine contact a poison control center or emergency room at once. NOTE: This medicine is only for you. Do not share this medicine with others. What if I miss a dose? Keep appointments for follow-up doses. It is important not to miss your dose. Call your care team if you are unable to keep an appointment. What may interact with this medication? Do not take this medication with any of the following medications: Deferoxamine Dimercaprol Other iron products This list may not describe  all possible interactions. Give your health care provider a list of all the medicines, herbs, non-prescription drugs, or dietary supplements you use. Also tell them if you smoke, drink alcohol, or use illegal drugs. Some items may interact with your medicine. What should I watch for while using this medication? Visit your care team for regular checks on your progress. Tell your care team if your symptoms do not start to get better or if they get worse. You may need blood work while you are taking this medication. You may need to eat more foods that contain iron. Talk to your care team. Foods that contain iron include whole grains/cereals, dried fruits, beans, or peas, leafy green vegetables, and organ meats (liver, kidney). What side effects may I notice from receiving this medication? Side effects that you should report to your care team as soon as possible: Allergic reactions--skin rash, itching, hives, swelling of the face, lips, tongue, or throat Increase in blood pressure Low blood pressure--dizziness, feeling faint or lightheaded, blurry vision Shortness of breath Side effects that usually do not require medical attention (report to your care team if they continue or are bothersome): Flushing Headache Joint pain Muscle pain Nausea Pain, redness, or irritation at injection site This list may not describe all possible side effects. Call your doctor for medical advice about side effects. You may report side effects to FDA at 1-800-FDA-1088. Where should I keep my medication? This medication is given in a hospital or clinic. It will not be stored at home. NOTE: This sheet is a summary. It may not cover all possible information. If you have questions about this medicine, talk to your doctor, pharmacist, or health care provider.  2023 Elsevier/Gold Standard (2011-11-14 00:00:00)

## 2021-11-28 NOTE — Progress Notes (Signed)
Pt declined to stay for 30 minute wait post iron infusion. VSS. No complaints at time of discharge.

## 2021-11-28 NOTE — Telephone Encounter (Signed)
LVM on pt's telephone to remind pt that she have appts here at [facility] today for lab, f/u Dr. Burr Medico, and infusion.  Informed pt that the lab appt which is the 1st appt for today is at 9:30am.  Based on appt desk, pt scheduled the appts and requested the times.

## 2021-11-28 NOTE — Progress Notes (Signed)
[place]   [phone] [phone]   Clinic Follow up Note   Patient Care Team: Drucie Opitz, MD as PCP - General  Date of Service:  11/28/2021  CHIEF COMPLAINT: f/u of HHT and anemia  CURRENT THERAPY:  -Blood transfusion for Hg<8.0 (2units on different days if Hg<7) -iv ferric gluconate, with ferritin goal 100-200.  -Avastin/Zirabev restarted on 04/03/18. Currently given PRN due to proteinuria. -Amicar 1g, intermittently since 07/17/18  ASSESSMENT & PLAN:  Peggy House is a 61 y.o. 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 -Pt was hospitalized several times from 1-10/2017 for severe recurrent GI bleeding from AVMs which required multiple blood transfusions and APCs. 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  -She is s/p IR embolization of the left Gastric and short gastric artery branch vessels on 12/10/17.  -She required cauterization of stomach for severe Gi bleeding in 11/2018, and epinephrine injection for bleeding ulcer on 11/03/20 -She has been treated with bevacizumab, frequent IV iron, and Amicar TID -she is very non-compliant with appointments, which she attributes to her depression and fatigue. I explained that treatment for her anemia should give her more energy. I encouraged her to tell her son so he can help her get to these appointments -she reports continued bleeding "episodes." Labs reviewed, hgb is down to 4.8 today. She has not received beva since 7/6. We will proceed bevacizumab and 2u pRBC today. Hopefully she will return for IV iron next week.   2. 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 Social Support, Depression -she lost her husband in 11/2019 in a traumatic accident that was not resolved legally. She continues to experience grief from this tragedy. -she and one of her sons are living together now. He provided transportation to her appointment today. -she was lost to follow up with [place] as well as behavior health since around 08/2021. I encouraged her to f/u. -she continues on Zoloft and Xanax per PCP  Smoking cessation -she smokes about 1/2 ppd (as of 08/03/20) -I previously encouraged her to stop smoking completely. She tried previously but had trouble.   4. HTN, Recently diagnosed DM -Continue medications. Will monitor on Avastin.       PLAN:  -proceed with 2u pRBC and beva today, and iv iron today or late this week  -Lab and iv ferric gluconate '250mg'$  weekly x8  -Beva every 2 weeks, with urine protein every 4 weeks  -F/u in 8 weeks    No problem-specific Assessment & Plan notes found for this encounter.   INTERVAL HISTORY:  Peggy House is here for a follow up of HHT and anemia. She was last seen by me on 08/30/21. She presents to the clinic alone; she notes her son brought her here today. She reports "episodes of bleeding" with black stool. When asked why she's missed so many appointments, she attributes it to depression. She confirms she still lives with her son.   All other systems were reviewed with the patient and are negative.  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    SURGICAL HISTORY: Past Surgical History:   Procedure Laterality Date   ABDOMINAL HYSTERECTOMY     CARPAL TUNNEL RELEASE  05/13/2011   Procedure: CARPAL TUNNEL RELEASE;  Surgeon: Nita Sells, MD;  Location: [place];  Service: Orthopedics;  Laterality: Left;   COLONOSCOPY N/A 03/02/2020   Procedure: COLONOSCOPY;  Surgeon: Ronald Lobo, MD;  [facility];  Service: Endoscopy;  Laterality: N/A;   COLONOSCOPY WITH PROPOFOL N/A 04/28/2014   Procedure: COLONOSCOPY WITH PROPOFOL;  Surgeon: Cleotis Nipper, MD;  Location: [facility] ENDOSCOPY;  Service: Endoscopy;  Laterality: N/A;   DG TOES*L*  2/10   rt   DILATION AND CURETTAGE OF UTERUS     ENTEROSCOPY N/A 10/17/2017   Procedure: ENTEROSCOPY;  Surgeon: Otis Brace, MD;  Location: [place];  Service: Gastroenterology;  Laterality: N/A;   ESOPHAGOGASTRODUODENOSCOPY N/A 04/10/2014   Procedure: ESOPHAGOGASTRODUODENOSCOPY (EGD);  Surgeon: [name], MD;  Location: [facility] ENDOSCOPY;  Service: Endoscopy;  Laterality: N/A;   ESOPHAGOGASTRODUODENOSCOPY N/A 05/10/2017   Procedure: ESOPHAGOGASTRODUODENOSCOPY (EGD);  Surgeon: Ronald Lobo, MD;  Location: [facility] ENDOSCOPY;  Service: Endoscopy;  Laterality: N/A;   ESOPHAGOGASTRODUODENOSCOPY N/A 09/22/2017   Procedure: ESOPHAGOGASTRODUODENOSCOPY (EGD);  Surgeon: Clarene Essex, MD;  Location: [place];  Service: Endoscopy;  Laterality: N/A;  bedside   ESOPHAGOGASTRODUODENOSCOPY N/A 03/02/2020   Procedure: ESOPHAGOGASTRODUODENOSCOPY (EGD);  Surgeon: Ronald Lobo, MD;  Location[facility];  Service: Endoscopy;  Laterality: N/A;   ESOPHAGOGASTRODUODENOSCOPY N/A 11/03/2020   Procedure: ESOPHAGOGASTRODUODENOSCOPY (EGD);  Surgeon: Wilford Corner, MD;  Location: [place];  Service: Endoscopy;  Laterality: N/A;   ESOPHAGOGASTRODUODENOSCOPY (EGD) WITH PROPOFOL N/A 04/27/2014   Procedure: ESOPHAGOGASTRODUODENOSCOPY (EGD) WITH PROPOFOL;  Surgeon: Cleotis Nipper, MD;  Location: [place];  Service:  Endoscopy;  Laterality: N/A;  possible apc   ESOPHAGOGASTRODUODENOSCOPY (EGD) WITH PROPOFOL N/A 09/30/2017   Procedure: ESOPHAGOGASTRODUODENOSCOPY (EGD) WITH PROPOFOL;  Surgeon: [name], MD;  Location: [place];  Service: Gastroenterology;  Laterality: N/A;   ESOPHAGOGASTRODUODENOSCOPY (EGD) WITH PROPOFOL N/A 10/01/2017   Procedure: ESOPHAGOGASTRODUODENOSCOPY (EGD) WITH PROPOFOL;  Surgeon: [name], MD;  Location: [place];  Service: Gastroenterology;  Laterality: N/A;   ESOPHAGOGASTRODUODENOSCOPY (EGD) WITH PROPOFOL N/A 10/08/2017   Procedure: ESOPHAGOGASTRODUODENOSCOPY (EGD) WITH PROPOFOL;  Surgeon: Otis Brace, MD;  Location: [place];  Service: Gastroenterology;  Laterality: N/A;   ESOPHAGOGASTRODUODENOSCOPY (EGD) WITH PROPOFOL N/A 10/17/2017   Procedure: ESOPHAGOGASTRODUODENOSCOPY (EGD) WITH PROPOFOL;  Surgeon: Otis Brace, MD;  Location: [place];  Service: Gastroenterology;  Laterality: N/A;   ESOPHAGOGASTRODUODENOSCOPY (EGD) WITH PROPOFOL N/A 10/19/2017   Procedure: ESOPHAGOGASTRODUODENOSCOPY (EGD) WITH PROPOFOL;  Surgeon: Otis Brace, MD;  Location: [place];  Service: Gastroenterology;  Laterality: N/A;   ESOPHAGOGASTRODUODENOSCOPY (EGD) WITH PROPOFOL N/A 12/04/2018   Procedure: ESOPHAGOGASTRODUODENOSCOPY (EGD) WITH PROPOFOL;  Surgeon: Wilford Corner, MD;  [facility];  Service: Endoscopy;  Laterality: N/A;   GIVENS CAPSULE STUDY N/A 10/02/2017   Procedure: GIVENS CAPSULE STUDY;  Surgeon: [name], MD;  Location: [place];  Service: Gastroenterology;  Laterality: N/A;   GIVENS CAPSULE STUDY N/A 10/08/2017   Procedure: GIVENS CAPSULE STUDY;  Surgeon: Otis Brace, MD;  Location: [facility]  ENDOSCOPY;  Service: Gastroenterology;  Laterality: N/A;  endoscopic placement of capsule   GIVENS CAPSULE STUDY N/A 03/02/2020   Procedure: GIVENS CAPSULE STUDY;  Surgeon: Ronald Lobo, MD;  [facility];  Service: Endoscopy;  Laterality:  N/A;   HEMOSTASIS CLIP PLACEMENT  12/04/2018   Procedure: HEMOSTASIS CLIP PLACEMENT;  Surgeon: Wilford Corner, MD;  [facility];  Service: Endoscopy;;   HOT HEMOSTASIS N/A 04/27/2014   Procedure: HOT HEMOSTASIS (ARGON PLASMA COAGULATION/BICAP);  Surgeon: Cleotis Nipper, MD;  Location: [facility] ENDOSCOPY;  Service: Endoscopy;  Laterality: N/A;   HOT HEMOSTASIS N/A 09/30/2017   Procedure: HOT HEMOSTASIS (ARGON PLASMA COAGULATION/BICAP);  Surgeon: [name], MD;  Location: [place];  Service: Gastroenterology;  Laterality: N/A;   HOT HEMOSTASIS N/A 10/01/2017   Procedure: HOT HEMOSTASIS (ARGON PLASMA COAGULATION/BICAP);  Surgeon: [name], MD;  Location: [place];  Service: Gastroenterology;  Laterality: N/A;   HOT HEMOSTASIS N/A 10/17/2017   Procedure: HOT HEMOSTASIS (ARGON PLASMA COAGULATION/BICAP);  Surgeon: Otis Brace, MD;  Location: [facility] ENDOSCOPY;  Service: Gastroenterology;  Laterality: N/A;   HOT HEMOSTASIS N/A 10/19/2017   Procedure: HOT HEMOSTASIS (ARGON PLASMA COAGULATION/BICAP);  Surgeon: Otis Brace, MD;  Location: [facility] ENDOSCOPY;  Service: Gastroenterology;  Laterality: N/A;   HOT HEMOSTASIS N/A 03/02/2020   Procedure: HOT HEMOSTASIS (ARGON PLASMA COAGULATION/BICAP);  Surgeon: Ronald Lobo, MD;  [facility];  Service: Endoscopy;  Laterality: N/A;   IR IMAGING GUIDED PORT INSERTION  07/08/2018   L shoulder Surgery  2011   POLYPECTOMY  03/02/2020   Procedure: POLYPECTOMY;  Surgeon: Ronald Lobo, MD;  [facility];  Service: Endoscopy;;   SCLEROTHERAPY  11/03/2020   Procedure: Clide Deutscher;  Surgeon: Wilford Corner, MD;  Location[facility] END[facility]COPY;  Service: Endoscopy;;   SUBMUCOSAL INJECTION  09/22/2017   Procedure: SUBMUCOSAL INJECTION;  Surgeon: Clarene Essex, MD;  Location: [place];  Service: Endoscopy;;   SUBMUCOSAL INJECTION  12/04/2018   Procedure: SUBMUCOSAL INJECTION;  Surgeon: Wilford Corner, MD;  [facility];   Service: Endoscopy;;    I have reviewed the social history and family history with the patient and they are unchanged from previous note.  ALLERGIES:  is allergic to feraheme [ferumoxytol], nsaids, tomato, iron (ferrous sulfate) [ferrous sulfate er], other, and wasp venom.  MEDICATIONS:  Current Outpatient Medications  Medication Sig Dispense Refill   Accu-Chek Softclix Lancets lancets Use to check blood sugar before breakfast and before dinner while on steroids (Patient taking differently: 1 each by Other route See admin instructions. Use to check blood sugar before breakfast and before dinner while on steroids) 100 each 1   acitretin (SORIATANE) 10 MG capsule Take 10 mg by mouth daily.     ALPRAZolam (XANAX) 0.5 MG tablet Take 1 tablet (0.5 mg total) by mouth at bedtime as needed for anxiety or sleep. 30 tablet 0   Aminocaproic Acid 1000 MG TABS Take 1 tablet (1,000 mg total) by mouth in the morning and at bedtime. 60 tablet 2   amLODipine (NORVASC) 10 MG tablet Take 1 tablet (10 mg total) by mouth daily. 90 tablet 3   atorvastatin (LIPITOR) 80 MG tablet TAKE 1 TABLET(80 MG) BY MOUTH DAILY 30 tablet 3   diphenoxylate-atropine (LOMOTIL) 2.5-0.025 MG tablet 1 to 2 PO QID prn diarrhea 30 tablet 1   furosemide (LASIX) 40 MG tablet Take 40 mg by mouth daily.     glucose blood (ACCU-CHEK GUIDE) test strip Check blood sugar 2 times per day while on steroids before breakfast and dinner (  Patient taking differently: 1 each by Other route See admin instructions. Check blood sugar 2 times per day while on steroids before breakfast and dinner) 50 each 3   hydrochlorothiazide (HYDRODIURIL) 12.5 MG tablet TAKE 1 TABLET(12.5 MG) BY MOUTH DAILY 90 tablet 1   lidocaine-prilocaine (EMLA) cream Apply 1 application topically as needed. 30 g 0   metFORMIN (GLUCOPHAGE) 500 MG tablet TAKE 1 TABLET(500 MG) BY MOUTH TWICE DAILY WITH A MEAL 180 tablet 3   methocarbamol (ROBAXIN) 500 MG tablet Take 1 tablet (500 mg  total) by mouth every 8 (eight) hours as needed for muscle spasms (back pain/spasm). 7 tablet 0   metoprolol succinate (TOPROL-XL) 100 MG 24 hr tablet Take 100 mg by mouth daily.     pantoprazole (PROTONIX) 40 MG tablet TAKE 1 TABLET(40 MG) BY MOUTH TWICE DAILY 60 tablet 5   Podiatric Products (FLEXITOL HEEL BALM) OINT Apply 1 application topically as needed (dry skin).      potassium chloride (KLOR-CON) 20 MEQ packet Take 20 mEq by mouth daily. 30 packet 1   PROAIR HFA 108 (90 Base) MCG/ACT inhaler INHALE 1 TO 2 PUFFS INTO THE LUNGS EVERY 6 HOURS AS NEEDED FOR WHEEZING OR SHORTNESS OF BREATH 8.5 g 0   prochlorperazine (COMPAZINE) 10 MG tablet Take 1 tablet (10 mg total) by mouth every 6 (six) hours as needed for nausea or vomiting. 30 tablet 1   sertraline (ZOLOFT) 100 MG tablet Take 1 tablet (100 mg total) by mouth daily. 30 tablet 2   sertraline (ZOLOFT) 25 MG tablet Take 5 tablets (125 mg total) by mouth daily. 150 tablet 2   terbinafine (LAMISIL AT) 1 % cream Apply 1 application topically 2 (two) times daily. Both bottom and top of both feet and toes 36 g 0   traZODone (DESYREL) 100 MG tablet TAKE 1 TABLET(100 MG) BY MOUTH AT BEDTIME 30 tablet 2   No current facility-administered medications for this visit.   Facility-Administered Medications Ordered in Other Visits  Medication Dose Route Frequency Provider Last Rate Last Admin   diphenhydrAMINE (BENADRYL) 25 mg capsule            diphenhydrAMINE (BENADRYL) 25 mg capsule            ferric gluconate (FERRLECIT) 250 mg in sodium chloride 0.9 % 250 mL IVPB  250 mg Intravenous Once Truitt Merle, MD 135 mL/hr at 11/28/21 1453 250 mg at 11/28/21 1453   heparin lock flush 100 unit/mL  500 Units Intracatheter Once PRN Truitt Merle, MD       phenylephrine (NEO-SYNEPHRINE) 1 % nasal drops 2 drop  2 drop Left Nare Once Truitt Merle, MD       sodium chloride flush (NS) 0.9 % injection 10 mL  10 mL Intracatheter PRN Truitt Merle, MD   10 mL at 08/14/20 1725    sodium chloride flush (NS) 0.9 % injection 10 mL  10 mL Intracatheter PRN Truitt Merle, MD        PHYSICAL EXAMINATION: ECOG PERFORMANCE STATUS: 2 - Symptomatic, <50% confined to bed  Vitals:   11/28/21 1037  BP: (!) 124/59  Pulse: 85  Temp: 98.6 F (37 C)  SpO2: 100%   Wt Readings from Last 3 Encounters:  11/28/21 232 lb 8 oz (105.5 kg)  11/21/21 230 lb 8 oz (104.6 kg)  10/18/21 229 lb 1.9 oz (103.9 kg)     GENERAL:alert, no distress and comfortable SKIN: skin color normal, no rashes or significant lesions EYES: normal, Conjunctiva  are pink and non-injected, sclera clear  NEURO: alert & oriented x 3 with fluent speech  LABORATORY DATA:  I have reviewed the data as listed    Latest Ref Rng & Units 11/28/2021    9:58 AM 11/21/2021    3:58 PM 10/18/2021    1:24 PM  CBC  WBC 4.0 - 10.5 K/uL 12.1  11.5  11.1   Hemoglobin 12.0 - 15.0 g/dL 4.8  5.9  7.6   Hematocrit 36.0 - 46.0 % 16.8  20.6  24.9   Platelets 150 - 400 K/uL 355  250  364         Latest Ref Rng & Units 09/13/2021   11:40 AM 07/19/2021   12:46 PM 06/29/2021   12:19 PM  CMP  Glucose 70 - 99 mg/dL 114  96  94   BUN 6 - 20 mg/dL '7  10  16   '$ Creatinine 0.44 - 1.00 mg/dL 0.77  0.86  1.08   Sodium 135 - 145 mmol/L 142  141  139   Potassium 3.5 - 5.1 mmol/L 3.4  3.7  3.4   Chloride 98 - 111 mmol/L 109  107  104   CO2 22 - 32 mmol/L '27  26  26   '$ Calcium 8.9 - 10.3 mg/dL 9.3  9.0  9.7   Total Protein 6.5 - 8.1 g/dL 6.8  7.5  7.9   Total Bilirubin 0.3 - 1.2 mg/dL 0.3  0.2  0.3   Alkaline Phos 38 - 126 U/L 57  67  66   AST 15 - 41 U/L '12  12  13   '$ ALT 0 - 44 U/L '5  7  5       '$ RADIOGRAPHIC STUDIES: I have personally reviewed the radiological images as listed and agreed with the findings in the report. No results found.    No orders of the defined types were placed in this encounter.  All questions were answered. The patient knows to call the clinic with any problems, questions or concerns. No barriers to learning  was detected. The total time spent in the appointment was 30 minutes.     Truitt Merle, MD 11/28/2021   I, Wilburn Mylar, am acting as scribe for Truitt Merle, MD.   I have reviewed the above documentation for accuracy and completeness, and I agree with the above.

## 2021-11-28 NOTE — Progress Notes (Signed)
Per Burr Medico MD, ok to treat with Bevacizumab prior to urine protein results today. PT to receive 2 units PRBC with tx.  Per MD, ok to run blood at 365m/hr today.

## 2021-11-28 NOTE — Progress Notes (Signed)
Ok to proceed with Ferrlecit today.  Raul Del Craig, [place], BCPS, BCOP 11/28/2021 1:31 PM

## 2021-11-29 ENCOUNTER — Other Ambulatory Visit: Payer: Self-pay | Admitting: Student

## 2021-11-29 DIAGNOSIS — F331 Major depressive disorder, recurrent, moderate: Secondary | ICD-10-CM

## 2021-11-29 LAB — BPAM RBC
Blood Product Expiration Date: 202309072359
Blood Product Expiration Date: 202309072359
ISSUE DATE / TIME: 202308161147
ISSUE DATE / TIME: 202308161147
Unit Type and Rh: 600
Unit Type and Rh: 600

## 2021-11-29 LAB — TYPE AND SCREEN
ABO/RH(D): A NEG
Antibody Screen: NEGATIVE
Unit division: 0
Unit division: 0

## 2021-11-29 NOTE — Telephone Encounter (Signed)
Zoloft 100 mg was refilled 11/21/21 x 2 RF's.

## 2021-11-29 NOTE — Telephone Encounter (Signed)
Refill Request   sertraline (ZOLOFT) 100 MG tablet  ALPRAZolam (XANAX) 0.5 MG tablet  traZODone (DESYREL) 100 MG tablet  [id number] - , [place]

## 2021-11-30 ENCOUNTER — Telehealth: Payer: Self-pay | Admitting: Student

## 2021-11-30 NOTE — Telephone Encounter (Signed)
.   Medicaid Managed Care   Unsuccessful Outreach Note  11/30/2021 Name: Peggy House MRN: [id number] DOB: 05-Jun-1960  Referred by: Drucie Opitz, MD Reason for referral : High Risk Managed Medicaid (I called the patient today to get her scheduled with the MM Team. I left my name and number on her VM.)   An unsuccessful telephone outreach was attempted today. The patient was referred to the case management team for assistance with care management and care coordination.   Follow Up Plan: The care management team will reach out to the patient again over the next 14 days.    [place]

## 2021-12-03 ENCOUNTER — Telehealth: Payer: Self-pay | Admitting: Hematology

## 2021-12-03 ENCOUNTER — Encounter: Payer: Self-pay | Admitting: General Practice

## 2021-12-03 MED ORDER — ALPRAZOLAM 0.5 MG PO TABS
0.5000 mg | ORAL_TABLET | Freq: Every evening | ORAL | 0 refills | Status: DC | PRN
Start: 2021-12-03 — End: 2022-03-01

## 2021-12-03 MED ORDER — TRAZODONE HCL 100 MG PO TABS: ORAL_TABLET | ORAL | 2 refills | Status: DC

## 2021-12-03 NOTE — Progress Notes (Signed)
[place] Spiritual Care Note  Referred by Altha Harm as an additional layer of support, particularly with Ms Isley-Wansley's grief history. Domingo Mend by phone to introduce Spiritual Care as part of her support team, ensuring that she has chaplain's direct number to empower her to reach out as needed/desired. We plan to follow up in person when she is on campus for treatment, as well.   [place], [place], [facility] Pager [phone] Voicemail [phone]

## 2021-12-03 NOTE — Telephone Encounter (Signed)
Left message with follow-up appointments per 8/16 los. Mailed calendar.

## 2021-12-07 ENCOUNTER — Inpatient Hospital Stay: Payer: Medicaid Other

## 2021-12-07 ENCOUNTER — Encounter: Payer: Self-pay | Admitting: General Practice

## 2021-12-07 DIAGNOSIS — N3941 Urge incontinence: Secondary | ICD-10-CM | POA: Diagnosis not present

## 2021-12-07 DIAGNOSIS — N319 Neuromuscular dysfunction of bladder, unspecified: Secondary | ICD-10-CM | POA: Diagnosis not present

## 2021-12-07 NOTE — Progress Notes (Signed)
[facility] Spiritual Care Note  Ms Bartell appears not to have arrived for her appointments today. Will continue to follow for opportunities to connect at a future treatment.   [place], [place], [facility] Pager [phone] Voicemail [phone]

## 2021-12-14 ENCOUNTER — Inpatient Hospital Stay: Payer: Medicaid Other

## 2021-12-14 ENCOUNTER — Inpatient Hospital Stay: Payer: Medicaid Other | Attending: Hematology

## 2021-12-14 DIAGNOSIS — I780 Hereditary hemorrhagic telangiectasia: Secondary | ICD-10-CM | POA: Insufficient documentation

## 2021-12-14 DIAGNOSIS — D500 Iron deficiency anemia secondary to blood loss (chronic): Secondary | ICD-10-CM | POA: Insufficient documentation

## 2021-12-14 DIAGNOSIS — Z79899 Other long term (current) drug therapy: Secondary | ICD-10-CM | POA: Insufficient documentation

## 2021-12-20 ENCOUNTER — Ambulatory Visit (INDEPENDENT_AMBULATORY_CARE_PROVIDER_SITE_OTHER): Payer: Medicaid Other | Admitting: Internal Medicine

## 2021-12-20 DIAGNOSIS — R32 Unspecified urinary incontinence: Secondary | ICD-10-CM

## 2021-12-20 NOTE — Progress Notes (Signed)
Call placed to patient's phone at [phone]. Unable to reach patient, VM left. Called placed at 2:55, 3:10, and 4:10. Will cancel appointment with no charge.

## 2021-12-21 ENCOUNTER — Inpatient Hospital Stay: Payer: Medicaid Other

## 2021-12-25 ENCOUNTER — Telehealth: Payer: Self-pay | Admitting: Hematology

## 2021-12-25 NOTE — Telephone Encounter (Signed)
Patient called in to find out when her next appointments were. Scheduler confirmed with patient that the appointment is Friday 9/15 at [place] for port flush/lab and infusion. Patient is aware and states she will be at the scheduled appointment.

## 2021-12-28 ENCOUNTER — Encounter: Payer: Self-pay | Admitting: General Practice

## 2021-12-28 ENCOUNTER — Inpatient Hospital Stay: Payer: Medicaid Other

## 2021-12-28 ENCOUNTER — Other Ambulatory Visit: Payer: Self-pay

## 2021-12-28 ENCOUNTER — Other Ambulatory Visit: Payer: Self-pay | Admitting: Hematology

## 2021-12-28 VITALS — BP 140/74 | HR 70 | Temp 98.0°F | Resp 16 | Ht 65.0 in | Wt 236.0 lb

## 2021-12-28 DIAGNOSIS — I780 Hereditary hemorrhagic telangiectasia: Secondary | ICD-10-CM

## 2021-12-28 DIAGNOSIS — Z79899 Other long term (current) drug therapy: Secondary | ICD-10-CM | POA: Diagnosis not present

## 2021-12-28 DIAGNOSIS — D500 Iron deficiency anemia secondary to blood loss (chronic): Secondary | ICD-10-CM | POA: Diagnosis present

## 2021-12-28 DIAGNOSIS — D649 Anemia, unspecified: Secondary | ICD-10-CM

## 2021-12-28 DIAGNOSIS — Z95828 Presence of other vascular implants and grafts: Secondary | ICD-10-CM

## 2021-12-28 LAB — FERRITIN: Ferritin: 10 ng/mL — ABNORMAL LOW (ref 11–307)

## 2021-12-28 LAB — CBC WITH DIFFERENTIAL (CANCER CENTER ONLY)
Abs Immature Granulocytes: 0.07 10*3/uL (ref 0.00–0.07)
Basophils Absolute: 0.1 10*3/uL (ref 0.0–0.1)
Basophils Relative: 0 %
Eosinophils Absolute: 0.2 10*3/uL (ref 0.0–0.5)
Eosinophils Relative: 1 %
HCT: 27.6 % — ABNORMAL LOW (ref 36.0–46.0)
Hemoglobin: 8 g/dL — ABNORMAL LOW (ref 12.0–15.0)
Immature Granulocytes: 1 %
Lymphocytes Relative: 13 %
Lymphs Abs: 1.7 10*3/uL (ref 0.7–4.0)
MCH: 23.9 pg — ABNORMAL LOW (ref 26.0–34.0)
MCHC: 29 g/dL — ABNORMAL LOW (ref 30.0–36.0)
MCV: 82.4 fL (ref 80.0–100.0)
Monocytes Absolute: 0.6 10*3/uL (ref 0.1–1.0)
Monocytes Relative: 4 %
Neutro Abs: 10.6 10*3/uL — ABNORMAL HIGH (ref 1.7–7.7)
Neutrophils Relative %: 81 %
Platelet Count: 409 10*3/uL — ABNORMAL HIGH (ref 150–400)
RBC: 3.35 MIL/uL — ABNORMAL LOW (ref 3.87–5.11)
RDW: 22.5 % — ABNORMAL HIGH (ref 11.5–15.5)
WBC Count: 13.2 10*3/uL — ABNORMAL HIGH (ref 4.0–10.5)
nRBC: 0 % (ref 0.0–0.2)

## 2021-12-28 LAB — SAMPLE TO BLOOD BANK

## 2021-12-28 MED ORDER — SODIUM CHLORIDE 0.9 % IV SOLN
250.0000 mg | Freq: Once | INTRAVENOUS | Status: AC
  Administered 2021-12-28: 250 mg via INTRAVENOUS
  Filled 2021-12-28: qty 20

## 2021-12-28 MED ORDER — SODIUM CHLORIDE 0.9 % IV SOLN: Freq: Once | INTRAVENOUS | Status: AC

## 2021-12-28 MED ORDER — HEPARIN SOD (PORK) LOCK FLUSH 100 UNIT/ML IV SOLN
500.0000 [IU] | Freq: Once | INTRAVENOUS | Status: AC | PRN
  Administered 2021-12-28: 500 [IU]

## 2021-12-28 MED ORDER — SODIUM CHLORIDE 0.9% FLUSH
10.0000 mL | INTRAVENOUS | Status: DC | PRN
  Administered 2021-12-28: 10 mL

## 2021-12-28 MED ORDER — SODIUM CHLORIDE 0.9% FLUSH
10.0000 mL | Freq: Once | INTRAVENOUS | Status: AC
  Administered 2021-12-28: 10 mL

## 2021-12-28 MED ORDER — SODIUM CHLORIDE 0.9 % IV SOLN
5.0000 mg/kg | Freq: Once | INTRAVENOUS | Status: AC
  Administered 2021-12-28: 500 mg via INTRAVENOUS
  Filled 2021-12-28: qty 16

## 2021-12-28 NOTE — Patient Instructions (Addendum)
[facility] [facility] MEDICAL ONCOLOGY  Discharge Instructions: Thank you for choosing [facility] to provide your oncology and hematology care.   If you have a lab appointment with the Cancer Center, please go directly to the Cancer Center and check in at the registration area.   Wear comfortable clothing and clothing appropriate for easy access to any Portacath or PICC line.   We strive to give you quality time with your provider. You may need to reschedule your appointment if you arrive late (15 or more minutes).  Arriving late affects you and other patients whose appointments are after yours.  Also, if you miss three or more appointments without notifying the office, you may be dismissed from the clinic at the provider's discretion.      For prescription refill requests, have your pharmacy contact our office and allow 72 hours for refills to be completed.    Today you received the following chemotherapy and/or immunotherapy agents: Bevacizumab      To help prevent nausea and vomiting after your treatment, we encourage you to take your nausea medication as directed.  BELOW ARE SYMPTOMS THAT SHOULD BE REPORTED IMMEDIATELY: *FEVER GREATER THAN 100.4 F (38 C) OR HIGHER *CHILLS OR SWEATING *NAUSEA AND VOMITING THAT IS NOT CONTROLLED WITH YOUR NAUSEA MEDICATION *UNUSUAL SHORTNESS OF BREATH *UNUSUAL BRUISING OR BLEEDING *URINARY PROBLEMS (pain or burning when urinating, or frequent urination) *BOWEL PROBLEMS (unusual diarrhea, constipation, pain near the anus) TENDERNESS IN MOUTH AND THROAT WITH OR WITHOUT PRESENCE OF ULCERS (sore throat, sores in mouth, or a toothache) UNUSUAL RASH, SWELLING OR PAIN  UNUSUAL VAGINAL DISCHARGE OR ITCHING   Items with * indicate a potential emergency and should be followed up as soon as possible or go to the Emergency Department if any problems should occur.  Please show the CHEMOTHERAPY ALERT CARD or IMMUNOTHERAPY ALERT CARD at check-in  to the Emergency Department and triage nurse.  Should you have questions after your visit or need to cancel or reschedule your appointment, please contact [facility] [place] [facility] MEDICAL ONCOLOGY  Dept: [phone]  and follow the prompts.  Office hours are 8:00 a.m. to 4:30 p.m. Monday - Friday. Please note that voicemails left after 4:00 p.m. may not be returned until the following business day.  We are closed weekends and major holidays. You have access to a nurse at all times for urgent questions. Please call the main number to the clinic Dept: [phone] and follow the prompts.   For any non-urgent questions, you may also contact your provider using MyChart. We now offer e-Visits for anyone 18 and older to request care online for non-urgent symptoms. For details visit [email or web address].   Also download the MyChart app! Go to the app store, search "MyChart", open the app, select [facility], and log in with your MyChart username and password.  Masks are optional in the cancer centers. If you would like for your care team to wear a mask while they are taking care of you, please let them know. You may have one support person who is at least 61 years old accompany you for your appointments. 

## 2021-12-28 NOTE — Progress Notes (Unsigned)
[place] Note  Connected with Peggy House while she was on campus to introduce [place] as part of her support team. She shared that she has lost both her mother and husband, grief that she processes with a therapist. We plan to follow up by phone next week for a longer conversation, and she has chaplain's direct number in case needs arise in the meantime.   [place], [place], [facility] Pager [phone] Voicemail [phone]

## 2021-12-28 NOTE — Progress Notes (Signed)
Patient declined to stay for her 30 minute post ferrlecit monitoring. VSS and ambulatory to lobby at discharge. Patient provided with updated schedule per request.

## 2022-01-01 ENCOUNTER — Encounter: Payer: Self-pay | Admitting: General Practice

## 2022-01-01 ENCOUNTER — Encounter: Payer: Self-pay | Admitting: Hematology

## 2022-01-01 NOTE — Progress Notes (Signed)
[place] Spiritual Care Note  Followed up with Ms Isley-Wansley by phone to continue to build rapport--and to wish her a happy birthday! She notes that she does not have anything particular on her mind at this time, but does welcome a follow-up visit at a future treatment.   [place], [place], [facility] Pager [phone] Voicemail [phone]

## 2022-01-03 ENCOUNTER — Inpatient Hospital Stay: Payer: Medicaid Other

## 2022-01-07 DIAGNOSIS — N3941 Urge incontinence: Secondary | ICD-10-CM | POA: Diagnosis not present

## 2022-01-07 DIAGNOSIS — N319 Neuromuscular dysfunction of bladder, unspecified: Secondary | ICD-10-CM | POA: Diagnosis not present

## 2022-01-09 ENCOUNTER — Other Ambulatory Visit: Payer: Self-pay

## 2022-01-09 DIAGNOSIS — K922 Gastrointestinal hemorrhage, unspecified: Secondary | ICD-10-CM

## 2022-01-09 NOTE — Telephone Encounter (Signed)
Refill request on pantoprazole (PROTONIX) 40 MG tablet.

## 2022-01-10 ENCOUNTER — Inpatient Hospital Stay: Payer: Medicaid Other

## 2022-01-10 ENCOUNTER — Other Ambulatory Visit: Payer: Self-pay

## 2022-01-11 MED ORDER — PANTOPRAZOLE SODIUM 40 MG PO TBEC: DELAYED_RELEASE_TABLET | ORAL | 5 refills | Status: DC

## 2022-01-16 ENCOUNTER — Encounter: Payer: Self-pay | Admitting: General Practice

## 2022-01-16 NOTE — Progress Notes (Signed)
[facility] Spiritual Care Note  Followed up with Ms Isley-Wansley by phone, providing reflective listening, emotional support, and prayer per request. 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 She was very appreciative of opportunity to discuss her prayer concerns.  We plan to follow up at a treatment in the latter part of the month (due to earlier scheduling conflicts), and she has direct Spiritual Care number in case needs arise in the meantime.   [place], [place], [facility] Pager [phone] Voicemail [phone]

## 2022-01-17 ENCOUNTER — Inpatient Hospital Stay: Payer: Medicaid Other

## 2022-01-17 ENCOUNTER — Other Ambulatory Visit: Payer: Self-pay

## 2022-01-17 ENCOUNTER — Inpatient Hospital Stay: Payer: Medicaid Other | Attending: Hematology

## 2022-01-17 VITALS — BP 137/98 | HR 69 | Temp 98.7°F | Resp 17

## 2022-01-17 DIAGNOSIS — D500 Iron deficiency anemia secondary to blood loss (chronic): Secondary | ICD-10-CM | POA: Diagnosis present

## 2022-01-17 DIAGNOSIS — R809 Proteinuria, unspecified: Secondary | ICD-10-CM | POA: Insufficient documentation

## 2022-01-17 DIAGNOSIS — K5521 Angiodysplasia of colon with hemorrhage: Secondary | ICD-10-CM | POA: Insufficient documentation

## 2022-01-17 DIAGNOSIS — R040 Epistaxis: Secondary | ICD-10-CM | POA: Insufficient documentation

## 2022-01-17 DIAGNOSIS — Z79899 Other long term (current) drug therapy: Secondary | ICD-10-CM | POA: Insufficient documentation

## 2022-01-17 DIAGNOSIS — Z95828 Presence of other vascular implants and grafts: Secondary | ICD-10-CM

## 2022-01-17 DIAGNOSIS — I780 Hereditary hemorrhagic telangiectasia: Secondary | ICD-10-CM | POA: Diagnosis present

## 2022-01-17 DIAGNOSIS — D649 Anemia, unspecified: Secondary | ICD-10-CM

## 2022-01-17 DIAGNOSIS — F32A Depression, unspecified: Secondary | ICD-10-CM | POA: Diagnosis not present

## 2022-01-17 LAB — CBC WITH DIFFERENTIAL (CANCER CENTER ONLY)
Abs Immature Granulocytes: 0.02 10*3/uL (ref 0.00–0.07)
Basophils Absolute: 0.1 10*3/uL (ref 0.0–0.1)
Basophils Relative: 1 %
Eosinophils Absolute: 0.1 10*3/uL (ref 0.0–0.5)
Eosinophils Relative: 1 %
HCT: 26.4 % — ABNORMAL LOW (ref 36.0–46.0)
Hemoglobin: 7.9 g/dL — ABNORMAL LOW (ref 12.0–15.0)
Immature Granulocytes: 0 %
Lymphocytes Relative: 14 %
Lymphs Abs: 1.4 10*3/uL (ref 0.7–4.0)
MCH: 24.3 pg — ABNORMAL LOW (ref 26.0–34.0)
MCHC: 29.9 g/dL — ABNORMAL LOW (ref 30.0–36.0)
MCV: 81.2 fL (ref 80.0–100.0)
Monocytes Absolute: 0.5 10*3/uL (ref 0.1–1.0)
Monocytes Relative: 5 %
Neutro Abs: 7.8 10*3/uL — ABNORMAL HIGH (ref 1.7–7.7)
Neutrophils Relative %: 79 %
Platelet Count: 305 10*3/uL (ref 150–400)
RBC: 3.25 MIL/uL — ABNORMAL LOW (ref 3.87–5.11)
RDW: 23.6 % — ABNORMAL HIGH (ref 11.5–15.5)
WBC Count: 9.9 10*3/uL (ref 4.0–10.5)
nRBC: 0 % (ref 0.0–0.2)

## 2022-01-17 LAB — TOTAL PROTEIN, URINE DIPSTICK: Protein, ur: 300 mg/dL — AB

## 2022-01-17 LAB — SAMPLE TO BLOOD BANK

## 2022-01-17 MED ORDER — SODIUM CHLORIDE 0.9% FLUSH
10.0000 mL | Freq: Once | INTRAVENOUS | Status: AC
  Administered 2022-01-17: 10 mL

## 2022-01-17 MED ORDER — HEPARIN SOD (PORK) LOCK FLUSH 100 UNIT/ML IV SOLN
500.0000 [IU] | Freq: Once | INTRAVENOUS | Status: AC
  Administered 2022-01-17: 500 [IU]

## 2022-01-17 MED ORDER — SODIUM CHLORIDE 0.9 % IV SOLN: INTRAVENOUS | Status: DC

## 2022-01-17 MED ORDER — SODIUM CHLORIDE 0.9 % IV SOLN
250.0000 mg | Freq: Once | INTRAVENOUS | Status: AC
  Administered 2022-01-17: 250 mg via INTRAVENOUS
  Filled 2022-01-17: qty 20

## 2022-01-17 NOTE — Patient Instructions (Signed)
Sodium Ferric Gluconate Complex Injection What is this medication? SODIUM FERRIC GLUCONATE COMPLEX (SOE dee um FER ik GLOO koe nate KOM pleks) treats low levels of iron (iron deficiency anemia) in people with kidney disease. Iron is a mineral that plays an important role in making red blood cells, which carry oxygen from your lungs to the rest of your body. This medicine may be used for other purposes; ask your health care provider or pharmacist if you have questions. COMMON BRAND NAME(S): Ferrlecit, Nulecit What should I tell my care team before I take this medication? They need to know if you have any of the following conditions: Anemia that is not from iron deficiency High levels of iron in the blood An unusual or allergic reaction to iron, other medications, foods, dyes, or preservatives Pregnant or are trying to become pregnant Breast-feeding How should I use this medication? This medication is injected into a vein. It is given by your care team in a hospital or clinic setting. Talk to your care team about the use of this medication in children. While it may be prescribed for children as young as 6 years for selected conditions, precautions do apply. Overdosage: If you think you have taken too much of this medicine contact a poison control center or emergency room at once. NOTE: This medicine is only for you. Do not share this medicine with others. What if I miss a dose? It is important not to miss your dose. Call your care team if you are unable to keep an appointment. What may interact with this medication? Do not take this medication with any of the following: Deferasirox Deferoxamine Dimercaprol This medication may also interact with the following: Other iron products This list may not describe all possible interactions. Give your health care provider a list of all the medicines, herbs, non-prescription drugs, or dietary supplements you use. Also tell them if you smoke, drink  alcohol, or use illegal drugs. Some items may interact with your medicine. What should I watch for while using this medication? Your condition will be monitored carefully while you are receiving this medication. Visit your care team for regular checks on your progress. You may need blood work while you are taking this medication. What side effects may I notice from receiving this medication? Side effects that you should report to your care team as soon as possible: Allergic reactions--skin rash, itching, hives, swelling of the face, lips, tongue, or throat Low blood pressure--dizziness, feeling faint or lightheaded, blurry vision Shortness of breath Side effects that usually do not require medical attention (report to your care team if they continue or are bothersome): Flushing Headache Joint pain Muscle pain Nausea Pain, redness, or irritation at injection site This list may not describe all possible side effects. Call your doctor for medical advice about side effects. You may report side effects to FDA at 1-800-FDA-1088. Where should I keep my medication? This medication is given in a hospital or clinic and will not be stored at home. NOTE: This sheet is a summary. It may not cover all possible information. If you have questions about this medicine, talk to your doctor, pharmacist, or health care provider.  2023 Elsevier/Gold Standard (2020-08-25 00:00:00)  

## 2022-01-18 LAB — FERRITIN: Ferritin: 22 ng/mL (ref 11–307)

## 2022-01-24 ENCOUNTER — Other Ambulatory Visit: Payer: Self-pay

## 2022-01-24 ENCOUNTER — Inpatient Hospital Stay (HOSPITAL_BASED_OUTPATIENT_CLINIC_OR_DEPARTMENT_OTHER): Payer: Medicaid Other | Admitting: Hematology

## 2022-01-24 ENCOUNTER — Encounter: Payer: Self-pay | Admitting: Hematology

## 2022-01-24 ENCOUNTER — Inpatient Hospital Stay: Payer: Medicaid Other

## 2022-01-24 ENCOUNTER — Other Ambulatory Visit (HOSPITAL_COMMUNITY): Payer: Self-pay

## 2022-01-24 VITALS — BP 161/77 | HR 56 | Temp 98.1°F | Resp 18

## 2022-01-24 VITALS — BP 124/59 | HR 62 | Temp 98.1°F | Resp 18 | Ht 65.0 in | Wt 235.0 lb

## 2022-01-24 DIAGNOSIS — I780 Hereditary hemorrhagic telangiectasia: Secondary | ICD-10-CM

## 2022-01-24 DIAGNOSIS — D500 Iron deficiency anemia secondary to blood loss (chronic): Secondary | ICD-10-CM

## 2022-01-24 DIAGNOSIS — D649 Anemia, unspecified: Secondary | ICD-10-CM

## 2022-01-24 DIAGNOSIS — D62 Acute posthemorrhagic anemia: Secondary | ICD-10-CM

## 2022-01-24 DIAGNOSIS — R040 Epistaxis: Secondary | ICD-10-CM

## 2022-01-24 DIAGNOSIS — Z95828 Presence of other vascular implants and grafts: Secondary | ICD-10-CM

## 2022-01-24 LAB — CBC WITH DIFFERENTIAL (CANCER CENTER ONLY)
Abs Immature Granulocytes: 0.04 10*3/uL (ref 0.00–0.07)
Basophils Absolute: 0 10*3/uL (ref 0.0–0.1)
Basophils Relative: 0 %
Eosinophils Absolute: 0.1 10*3/uL (ref 0.0–0.5)
Eosinophils Relative: 1 %
HCT: 21 % — ABNORMAL LOW (ref 36.0–46.0)
Hemoglobin: 6.1 g/dL — CL (ref 12.0–15.0)
Immature Granulocytes: 0 %
Lymphocytes Relative: 12 %
Lymphs Abs: 1.3 10*3/uL (ref 0.7–4.0)
MCH: 24.5 pg — ABNORMAL LOW (ref 26.0–34.0)
MCHC: 29 g/dL — ABNORMAL LOW (ref 30.0–36.0)
MCV: 84.3 fL (ref 80.0–100.0)
Monocytes Absolute: 0.5 10*3/uL (ref 0.1–1.0)
Monocytes Relative: 4 %
Neutro Abs: 8.6 10*3/uL — ABNORMAL HIGH (ref 1.7–7.7)
Neutrophils Relative %: 83 %
Platelet Count: 258 10*3/uL (ref 150–400)
RBC: 2.49 MIL/uL — ABNORMAL LOW (ref 3.87–5.11)
RDW: 25.4 % — ABNORMAL HIGH (ref 11.5–15.5)
WBC Count: 10.5 10*3/uL (ref 4.0–10.5)
nRBC: 0 % (ref 0.0–0.2)

## 2022-01-24 LAB — SAMPLE TO BLOOD BANK

## 2022-01-24 LAB — PREPARE RBC (CROSSMATCH)

## 2022-01-24 MED ORDER — SODIUM CHLORIDE 0.9 % IV SOLN: Freq: Once | INTRAVENOUS | Status: AC

## 2022-01-24 MED ORDER — AMINOCAPROIC ACID 1000 MG PO TABS
1000.0000 mg | ORAL_TABLET | Freq: Two times a day (BID) | ORAL | 2 refills | Status: DC
  Filled 2022-01-24 (×2): qty 30, 15d supply, fill #0

## 2022-01-24 MED ORDER — SODIUM CHLORIDE 0.9% FLUSH
10.0000 mL | INTRAVENOUS | Status: DC | PRN
  Administered 2022-01-24: 10 mL

## 2022-01-24 MED ORDER — SODIUM CHLORIDE 0.9% FLUSH
10.0000 mL | Freq: Once | INTRAVENOUS | Status: AC
  Administered 2022-01-24: 10 mL

## 2022-01-24 MED ORDER — HEPARIN SOD (PORK) LOCK FLUSH 100 UNIT/ML IV SOLN
500.0000 [IU] | Freq: Once | INTRAVENOUS | Status: AC | PRN
  Administered 2022-01-24: 500 [IU]

## 2022-01-24 MED ORDER — ACETAMINOPHEN 325 MG PO TABS
650.0000 mg | ORAL_TABLET | Freq: Once | ORAL | Status: AC
  Administered 2022-01-24: 650 mg via ORAL
  Filled 2022-01-24: qty 2

## 2022-01-24 MED ORDER — SODIUM CHLORIDE 0.9% IV SOLUTION: 250.0000 mL | Freq: Once | INTRAVENOUS | Status: DC

## 2022-01-24 MED ORDER — SODIUM CHLORIDE 0.9 % IV SOLN
5.0000 mg/kg | Freq: Once | INTRAVENOUS | Status: AC
  Administered 2022-01-24: 500 mg via INTRAVENOUS
  Filled 2022-01-24: qty 16

## 2022-01-24 MED ORDER — DIPHENHYDRAMINE HCL 25 MG PO CAPS
25.0000 mg | ORAL_CAPSULE | Freq: Once | ORAL | Status: AC
  Administered 2022-01-24: 25 mg via ORAL
  Filled 2022-01-24: qty 1

## 2022-01-24 MED ORDER — SODIUM CHLORIDE 0.9 % IV SOLN: 250.0000 mg | Freq: Once | INTRAVENOUS | Status: DC

## 2022-01-24 NOTE — Patient Instructions (Signed)

## 2022-01-24 NOTE — Progress Notes (Signed)
Spoke with Beth in [facility] regarding T&S and Prepare orders.  Beth confirmed orders.  Pt will get 2 units of PRBCs today while in infusion.

## 2022-01-24 NOTE — Progress Notes (Signed)
Per Dr. Burr Medico okay to treat with last urine protein 300 on 01/17/22 Okay to run blood at 300 mls/hr

## 2022-01-24 NOTE — Progress Notes (Signed)
[name]   Telephone:(336) [phone] Fax:(336) [phone]   Clinic Follow up Note   Patient Care Team: Drucie Opitz, MD as PCP - General  Date of Service:  01/24/2022  CHIEF COMPLAINT: f/u of HHT and anemia  CURRENT THERAPY:  -Blood transfusion for Hg<8.0 (2units on different days if Hg<7) -iv ferric gluconate, with ferritin goal 100-200.  -Avastin/Zirabev restarted on 04/03/18. Currently given PRN due to proteinuria. -Amicar 1g BID, intermittently since 07/17/18  ASSESSMENT & PLAN:  Peggy House is a 61 y.o. 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 -she has required multiple blood transfusions and APCs since 2019. 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  -s/p IR embolization of gastric artery branch vessels on 12/10/17, cauterization of stomach for severe GI bleed in 11/2018, and epinephrine injection for bleeding ulcer on 11/03/20. -She has been treated with bevacizumab, frequent IV iron, and Amicar TID, though intermittently due to noncompliance. -I again discussed the importance of compliance with appointments/treatments. I review with her every time she does show for an appointment. I explained the recommendation for weekly treatments to hopefully bring her counts up. I strongly advised her to come to her appointments. She notes she ran out of Amicar; I refilled today. -labs reviewed, hgb is down to 6.1 today, she needs blood transfusion. Last urine protein from 10/5 was 300, beva most recently given on 12/28/21. She is scheduled for beva and IV ferrlecit today, will schedule pRBC for today.   2. 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 Social Support, Depression -she lost her husband in 11/2019 in a traumatic accident that was not resolved legally. She continues to experience grief from this tragedy. -she and one of her sons are living together now. He provides transportation to her appointments. -she continues on Zoloft and Xanax per PCP     PLAN:  -proceed with IV beva today, and 2u blood, she does not have enough infusion time for iv iron today  -continue Amicar BID, I refilled today -Lab and iv ferric gluconate '250mg'$  weekly x8 and one extra dose next Tuesday  -Beva every 2 weeks, with urine protein every 4 weeks  -F/u in 8 weeks    No problem-specific Assessment & Plan notes found for this encounter.   INTERVAL HISTORY:  Peggy House is here for a follow up of HHT and anemia. She was last seen by me on 11/28/21, has missed several appointments. She presents to the clinic alone. She reports she is feeling tired. I asked about her missed appointments and if she is still having transportation issues. She denies and explains her son brings her.   All other systems were reviewed with the patient and are negative.  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    SURGICAL HISTORY: Past Surgical History:  Procedure Laterality Date   ABDOMINAL HYSTERECTOMY     CARPAL TUNNEL RELEASE  05/13/2011   Procedure: CARPAL TUNNEL RELEASE;  Surgeon: Nita Sells, MD;  Location: [place];  Service: Orthopedics;  Laterality: Left;   COLONOSCOPY N/A 03/02/2020    Procedure: COLONOSCOPY;  Surgeon: Ronald Lobo, MD;  Location: [facility] ENDOSCOPY;  Service: Endoscopy;  Laterality: N/A;   COLONOSCOPY WITH PROPOFOL N/A 04/28/2014   Procedure: COLONOSCOPY WITH PROPOFOL;  Surgeon: Cleotis Nipper, MD;  Location: [facility] ENDOSCOPY;  Service: Endoscopy;  Laterality: N/A;   DG TOES*L*  2/10   rt   DILATION AND CURETTAGE OF UTERUS     ENTEROSCOPY N/A 10/17/2017   Procedure: ENTEROSCOPY;  Surgeon: Otis Brace, MD;  Location: [place];  Service: Gastroenterology;  Laterality: N/A;   ESOPHAGOGASTRODUODENOSCOPY N/A 04/10/2014   Procedure: ESOPHAGOGASTRODUODENOSCOPY (EGD);  Surgeon: [name], MD;  Location[facility] ENDOSCOPY;  Service: Endoscopy;  Laterality: N/A;   ESOPHAGOGASTRODUODENOSCOPY N/A 05/10/2017   Procedure: ESOPHAGOGASTRODUODENOSCOPY (EGD);  Surgeon: Ronald Lobo, MD;  Location: [facility] ENDOSCOPY;  Service: Endoscopy;  Laterality: N/A;   ESOPHAGOGASTRODUODENOSCOPY N/A 09/22/2017   Procedure: ESOPHAGOGASTRODUODENOSCOPY (EGD);  Surgeon: Clarene Essex, MD;  Location: [place];  Service: Endoscopy;  Laterality: N/A;  bedside   ESOPHAGOGASTRODUODENOSCOPY N/A 03/02/2020   Procedure: ESOPHAGOGASTRODUODENOSCOPY (EGD);  Surgeon: Ronald Lobo, MD;  Location: [facility];  Service: Endoscopy;  Laterality: N/A;   ESOPHAGOGASTRODUODENOSCOPY N/A 11/03/2020   Procedure: ESOPHAGOGASTRODUODENOSCOPY (EGD);  Surgeon: Wilford Corner, MD;  Location: [place];  Service: Endoscopy;  Laterality: N/A;   ESOPHAGOGASTRODUODENOSCOPY (EGD) WITH PROPOFOL N/A 04/27/2014   Procedure: ESOPHAGOGASTRODUODENOSCOPY (EGD) WITH PROPOFOL;  Surgeon: Cleotis Nipper, MD;  Location: [place];  Service: Endoscopy;  Laterality: N/A;  possible apc   ESOPHAGOGASTRODUODENOSCOPY (EGD) WITH PROPOFOL N/A 09/30/2017   Procedure: ESOPHAGOGASTRODUODENOSCOPY (EGD) WITH PROPOFOL;  Surgeon: [name], MD;  Location: [place];  Service: Gastroenterology;  Laterality: N/A;    ESOPHAGOGASTRODUODENOSCOPY (EGD) WITH PROPOFOL N/A 10/01/2017   Procedure: ESOPHAGOGASTRODUODENOSCOPY (EGD) WITH PROPOFOL;  Surgeon: [name], MD;  Location: [place];  Service: Gastroenterology;  Laterality: N/A;   ESOPHAGOGASTRODUODENOSCOPY (EGD) WITH PROPOFOL N/A 10/08/2017   Procedure: ESOPHAGOGASTRODUODENOSCOPY (EGD) WITH PROPOFOL;  Surgeon: Otis Brace, MD;  Location: [place];  Service: Gastroenterology;  Laterality: N/A;   ESOPHAGOGASTRODUODENOSCOPY (EGD) WITH PROPOFOL N/A 10/17/2017   Procedure: ESOPHAGOGASTRODUODENOSCOPY (EGD) WITH PROPOFOL;  Surgeon: Otis Brace, MD;  Location: [place];  Service: Gastroenterology;  Laterality: N/A;   ESOPHAGOGASTRODUODENOSCOPY (EGD) WITH PROPOFOL N/A 10/19/2017   Procedure: ESOPHAGOGASTRODUODENOSCOPY (EGD) WITH PROPOFOL;  Surgeon: Otis Brace, MD;  Location: [place];  Service: Gastroenterology;  Laterality: N/A;   ESOPHAGOGASTRODUODENOSCOPY (EGD) WITH PROPOFOL N/A 12/04/2018   Procedure: ESOPHAGOGASTRODUODENOSCOPY (EGD) WITH PROPOFOL;  Surgeon: Wilford Corner, MD;  Location: [facility] ENDOSCOPY;  Service: Endoscopy;  Laterality: N/A;   GIVENS CAPSULE STUDY N/A 10/02/2017   Procedure: GIVENS CAPSULE STUDY;  Surgeon: [name], MD;  Location: [place];  Service: Gastroenterology;  Laterality: N/A;   GIVENS CAPSULE STUDY N/A 10/08/2017   Procedure: GIVENS CAPSULE STUDY;  Surgeon: Otis Brace, MD;  Location: [place];  Service: Gastroenterology;  Laterality: N/A;  endoscopic placement of capsule   GIVENS CAPSULE STUDY N/A 03/02/2020   Procedure: GIVENS CAPSULE STUDY;  Surgeon: Ronald Lobo, MD;  Location: [facility]OS[facility];  Service: Endoscopy;  Laterality: N/A;   HEMOSTASIS CLIP PLACEMENT  12/04/2018   Procedure: HEMOSTASIS CLIP PLACEMENT;  Surgeon: Wilford Corner, MD;  Location: [facility] ENDOS[facility];  Service: Endoscopy;;   HOT HEMOSTASIS N/A 04/27/2014   Procedure: HOT HEMOSTASIS (ARGON PLASMA COAGULATION/BICAP);   Surgeon: Cleotis Nipper, MD;  Location: [facility] ENDOSCOPY;  Service: Endoscopy;  Laterality: N/A;   HOT HEMOSTASIS N/A 09/30/2017   Procedure: HOT HEMOSTASIS (ARGON PLASMA COAGULATION/BICAP);  Surgeon: [name], MD;  Location: [place];  Service: Gastroenterology;  Laterality: N/A;   HOT HEMOSTASIS N/A 10/01/2017   Procedure: HOT HEMOSTASIS (ARGON PLASMA COAGULATION/BICAP);  Surgeon: [name], MD;  Location: [place];  Service: Gastroenterology;  Laterality: N/A;   HOT HEMOSTASIS N/A 10/17/2017   Procedure: HOT HEMOSTASIS (ARGON PLASMA COAGULATION/BICAP);  Surgeon: Otis Brace, MD;  Location: [facility] ENDOSCOPY;  Service: Gastroenterology;  Laterality: N/A;   HOT HEMOSTASIS N/A 10/19/2017   Procedure: HOT HEMOSTASIS (ARGON PLASMA COAGULATION/BICAP);  Surgeon: Otis Brace, MD;  Location: [facility] ENDOSCOPY;  Service: Gastroenterology;  Laterality: N/A;   HOT HEMOSTASIS N/A 03/02/2020   Procedure: HOT HEMOSTASIS (ARGON PLASMA COAGULATION/BICAP);  Surgeon: Ronald Lobo, MD;  Location: [facility];  Service: Endoscopy;  Laterality: N/A;   IR IMAGING GUIDED PORT INSERTION  07/08/2018   L shoulder Surgery  2011   POLYPECTOMY  03/02/2020   Procedure: POLYPECTOMY;  Surgeon: Ronald Lobo, MD;  Location: [facility] ENDOSCOPY;  Service: Endoscopy;;   SCLEROTHERAPY  11/03/2020   Procedure: Clide Deutscher;  Surgeon: Wilford Corner, MD;  Location: [facility] ENDOSCOPY;  Service: Endoscopy;;   SUBMUCOSAL INJECTION  09/22/2017   Procedure: SUBMUCOSAL INJECTION;  Surgeon: Clarene Essex, MD;  Location: [place];  Service: Endoscopy;;   SUBMUCOSAL INJECTION  12/04/2018   Procedure: SUBMUCOSAL INJECTION;  Surgeon: Wilford Corner, MD;  Location: [facility] ENDOSCOPY;  Service: Endoscopy;;    I have reviewed the social history and family history with the patient and they are unchanged from previous note.  ALLERGIES:  is allergic to feraheme [ferumoxytol], nsaids, tomato, iron (ferrous sulfate) [ferrous sulfate er], other,  and wasp venom.  MEDICATIONS:  Current Outpatient Medications  Medication Sig Dispense Refill   Accu-Chek Softclix Lancets lancets Use to check blood sugar before breakfast and before dinner while on steroids (Patient taking differently: 1 each by Other route See admin instructions. Use to check blood sugar before breakfast and before dinner while on steroids) 100 each 1   acitretin (SORIATANE) 10 MG capsule Take 10 mg by mouth daily.     ALPRAZolam (XANAX) 0.5 MG tablet Take 1 tablet (0.5 mg total) by mouth at bedtime as needed for anxiety or sleep. 30 tablet 0   Aminocaproic Acid 1000 MG TABS Take 1 tablet (1,000 mg total) by mouth in the morning and at bedtime. 60 tablet 2   amLODipine (NORVASC) 10 MG tablet Take 1 tablet (10 mg total) by mouth daily. 90 tablet 3   atorvastatin (LIPITOR) 80 MG tablet TAKE 1 TABLET(80 MG) BY MOUTH DAILY 30 tablet 3   diphenoxylate-atropine (LOMOTIL) 2.5-0.025 MG tablet 1 to 2 PO QID prn diarrhea 30 tablet 1   furosemide (LASIX) 40 MG tablet Take 40 mg by mouth daily.     glucose blood (ACCU-CHEK GUIDE) test strip Check blood sugar 2 times per day while on steroids before breakfast and dinner (Patient taking differently: 1 each by Other route See admin instructions. Check blood sugar 2 times per day while on steroids before breakfast and dinner) 50 each 3   hydrochlorothiazide (HYDRODIURIL) 12.5 MG tablet TAKE 1 TABLET(12.5 MG) BY MOUTH DAILY 90 tablet 1   lidocaine-prilocaine (EMLA) cream Apply 1 application topically  as needed. 30 g 0   metFORMIN (GLUCOPHAGE) 500 MG tablet TAKE 1 TABLET(500 MG) BY MOUTH TWICE DAILY WITH A MEAL 180 tablet 3   methocarbamol (ROBAXIN) 500 MG tablet Take 1 tablet (500 mg total) by mouth every 8 (eight) hours as needed for muscle spasms (back pain/spasm). 7 tablet 0   metoprolol succinate (TOPROL-XL) 100 MG 24 hr tablet Take 100 mg by mouth daily.     pantoprazole (PROTONIX) 40 MG tablet TAKE 1 TABLET(40 MG) BY MOUTH TWICE DAILY 60  tablet 5   Podiatric Products (FLEXITOL HEEL BALM) OINT Apply 1 application topically as needed (dry skin).      potassium chloride (KLOR-CON) 20 MEQ packet Take 20 mEq by mouth daily. 30 packet 1   PROAIR HFA 108 (90 Base) MCG/ACT inhaler INHALE 1 TO 2 PUFFS INTO THE LUNGS EVERY 6 HOURS AS NEEDED FOR WHEEZING OR SHORTNESS OF BREATH 8.5 g 0   prochlorperazine (COMPAZINE) 10 MG tablet Take 1 tablet (10 mg total) by mouth every 6 (six) hours as needed for nausea or vomiting. 30 tablet 1   sertraline (ZOLOFT) 100 MG tablet Take 1 tablet (100 mg total) by mouth daily. 30 tablet 2   sertraline (ZOLOFT) 25 MG tablet Take 5 tablets (125 mg total) by mouth daily. 150 tablet 2   terbinafine (LAMISIL AT) 1 % cream Apply 1 application topically 2 (two) times daily. Both bottom and top of both feet and toes 36 g 0   traZODone (DESYREL) 100 MG tablet TAKE 1 TABLET(100 MG) BY MOUTH AT BEDTIME 30 tablet 2   No current facility-administered medications for this visit.   Facility-Administered Medications Ordered in Other Visits  Medication Dose Route Frequency Provider Last Rate Last Admin   diphenhydrAMINE (BENADRYL) 25 mg capsule            diphenhydrAMINE (BENADRYL) 25 mg capsule            heparin lock flush 100 unit/mL  500 Units Intracatheter Once PRN Truitt Merle, MD       phenylephrine (NEO-SYNEPHRINE) 1 % nasal drops 2 drop  2 drop Left Nare Once Truitt Merle, MD       sodium chloride flush (NS) 0.9 % injection 10 mL  10 mL Intracatheter PRN Truitt Merle, MD        PHYSICAL EXAMINATION: ECOG PERFORMANCE STATUS: 2 - Symptomatic, <50% confined to bed  Vitals:   01/24/22 1228  BP: (!) 124/59  Pulse: 62  Resp: 18  Temp: 98.1 F (36.7 C)  SpO2: 100%   Wt Readings from Last 3 Encounters:  01/24/22 235 lb (106.6 kg)  12/28/21 236 lb (107 kg)  11/28/21 232 lb 8 oz (105.5 kg)     GENERAL:alert, no distress and comfortable SKIN: skin color normal, no rashes or significant lesions EYES: normal,  Conjunctiva are pink and non-injected, sclera clear  NEURO: alert & oriented x 3 with fluent speech  LABORATORY DATA:  I have reviewed the data as listed    Latest Ref Rng & Units 01/24/2022   12:18 PM 01/17/2022    2:14 PM 12/28/2021   11:44 AM  CBC  WBC 4.0 - 10.5 K/uL 10.5  9.9  13.2   Hemoglobin 12.0 - 15.0 g/dL 6.1  7.9  8.0   Hematocrit 36.0 - 46.0 % 21.0  26.4  27.6   Platelets 150 - 400 K/uL 258  305  409         Latest Ref Rng & Units 09/13/2021  11:40 AM 07/19/2021   12:46 PM 06/29/2021   12:19 PM  CMP  Glucose 70 - 99 mg/dL 114  96  94   BUN 6 - 20 mg/dL '7  10  16   '$ Creatinine 0.44 - 1.00 mg/dL 0.77  0.86  1.08   Sodium 135 - 145 mmol/L 142  141  139   Potassium 3.5 - 5.1 mmol/L 3.4  3.7  3.4   Chloride 98 - 111 mmol/L 109  107  104   CO2 22 - 32 mmol/L '27  26  26   '$ Calcium 8.9 - 10.3 mg/dL 9.3  9.0  9.7   Total Protein 6.5 - 8.1 g/dL 6.8  7.5  7.9   Total Bilirubin 0.3 - 1.2 mg/dL 0.3  0.2  0.3   Alkaline Phos 38 - 126 U/L 57  67  66   AST 15 - 41 U/L '12  12  13   '$ ALT 0 - 44 U/L '5  7  5       '$ RADIOGRAPHIC STUDIES: I have personally reviewed the radiological images as listed and agreed with the findings in the report. No results found.    No orders of the defined types were placed in this encounter.  All questions were answered. The patient knows to call the clinic with any problems, questions or concerns. No barriers to learning was detected. The total time spent in the appointment was 30 minutes.     Truitt Merle, MD 01/24/2022   I, Wilburn Mylar, am acting as scribe for Truitt Merle, MD.   I have reviewed the above documentation for accuracy and completeness, and I agree with the above.

## 2022-01-24 NOTE — Progress Notes (Signed)
Per Dr. Burr Medico, "OK To Treat" w/previous urine protein of 300 today.

## 2022-01-25 ENCOUNTER — Telehealth: Payer: Self-pay | Admitting: Hematology

## 2022-01-25 LAB — TYPE AND SCREEN
ABO/RH(D): A NEG
Antibody Screen: NEGATIVE
Unit division: 0
Unit division: 0

## 2022-01-25 LAB — BPAM RBC
Blood Product Expiration Date: 202311082359
Blood Product Expiration Date: 202311082359
ISSUE DATE / TIME: 202310121430
ISSUE DATE / TIME: 202310121430
Unit Type and Rh: 600
Unit Type and Rh: 600

## 2022-01-25 NOTE — Telephone Encounter (Signed)
Left message with follow-up appointments per 10/12 los. Mailed calendar.

## 2022-01-25 NOTE — Telephone Encounter (Signed)
Left patient a detailed voicemail regarding upcoming appointments

## 2022-01-29 ENCOUNTER — Inpatient Hospital Stay: Payer: Medicaid Other

## 2022-01-31 ENCOUNTER — Inpatient Hospital Stay: Payer: Medicaid Other

## 2022-01-31 ENCOUNTER — Other Ambulatory Visit: Payer: Self-pay

## 2022-01-31 VITALS — BP 163/71 | HR 66 | Resp 14 | Wt 235.4 lb

## 2022-01-31 DIAGNOSIS — D649 Anemia, unspecified: Secondary | ICD-10-CM

## 2022-01-31 DIAGNOSIS — Z95828 Presence of other vascular implants and grafts: Secondary | ICD-10-CM

## 2022-01-31 DIAGNOSIS — D500 Iron deficiency anemia secondary to blood loss (chronic): Secondary | ICD-10-CM

## 2022-01-31 DIAGNOSIS — I780 Hereditary hemorrhagic telangiectasia: Secondary | ICD-10-CM | POA: Diagnosis not present

## 2022-01-31 LAB — TYPE AND SCREEN
ABO/RH(D): A NEG
Antibody Screen: NEGATIVE

## 2022-01-31 LAB — CBC WITH DIFFERENTIAL (CANCER CENTER ONLY)
Abs Immature Granulocytes: 0.02 10*3/uL (ref 0.00–0.07)
Basophils Absolute: 0.1 10*3/uL (ref 0.0–0.1)
Basophils Relative: 1 %
Eosinophils Absolute: 0.1 10*3/uL (ref 0.0–0.5)
Eosinophils Relative: 1 %
HCT: 24.3 % — ABNORMAL LOW (ref 36.0–46.0)
Hemoglobin: 7.2 g/dL — ABNORMAL LOW (ref 12.0–15.0)
Immature Granulocytes: 0 %
Lymphocytes Relative: 14 %
Lymphs Abs: 1.3 10*3/uL (ref 0.7–4.0)
MCH: 25.1 pg — ABNORMAL LOW (ref 26.0–34.0)
MCHC: 29.6 g/dL — ABNORMAL LOW (ref 30.0–36.0)
MCV: 84.7 fL (ref 80.0–100.0)
Monocytes Absolute: 0.4 10*3/uL (ref 0.1–1.0)
Monocytes Relative: 4 %
Neutro Abs: 7.5 10*3/uL (ref 1.7–7.7)
Neutrophils Relative %: 80 %
Platelet Count: 284 10*3/uL (ref 150–400)
RBC: 2.87 MIL/uL — ABNORMAL LOW (ref 3.87–5.11)
RDW: 22.3 % — ABNORMAL HIGH (ref 11.5–15.5)
WBC Count: 9.2 10*3/uL (ref 4.0–10.5)
nRBC: 0 % (ref 0.0–0.2)

## 2022-01-31 LAB — FERRITIN: Ferritin: 27 ng/mL (ref 11–307)

## 2022-01-31 MED ORDER — SODIUM CHLORIDE 0.9% FLUSH
10.0000 mL | Freq: Once | INTRAVENOUS | Status: AC
  Administered 2022-01-31: 10 mL

## 2022-01-31 MED ORDER — SODIUM CHLORIDE 0.9 % IV SOLN
250.0000 mg | Freq: Once | INTRAVENOUS | Status: AC
  Administered 2022-01-31: 250 mg via INTRAVENOUS
  Filled 2022-01-31: qty 20

## 2022-01-31 MED ORDER — HEPARIN SOD (PORK) LOCK FLUSH 100 UNIT/ML IV SOLN
500.0000 [IU] | Freq: Once | INTRAVENOUS | Status: AC
  Administered 2022-01-31: 500 [IU]

## 2022-01-31 NOTE — Patient Instructions (Signed)
Sodium Ferric Gluconate Complex Injection What is this medication? SODIUM FERRIC GLUCONATE COMPLEX (SOE dee um FER ik GLOO koe nate KOM pleks) treats low levels of iron (iron deficiency anemia) in people with kidney disease. Iron is a mineral that plays an important role in making red blood cells, which carry oxygen from your lungs to the rest of your body. This medicine may be used for other purposes; ask your health care provider or pharmacist if you have questions. COMMON BRAND NAME(S): Ferrlecit, Nulecit What should I tell my care team before I take this medication? They need to know if you have any of the following conditions: Anemia that is not from iron deficiency High levels of iron in the blood An unusual or allergic reaction to iron, other medications, foods, dyes, or preservatives Pregnant or are trying to become pregnant Breast-feeding How should I use this medication? This medication is injected into a vein. It is given by your care team in a hospital or clinic setting. Talk to your care team about the use of this medication in children. While it may be prescribed for children as young as 6 years for selected conditions, precautions do apply. Overdosage: If you think you have taken too much of this medicine contact a poison control center or emergency room at once. NOTE: This medicine is only for you. Do not share this medicine with others. What if I miss a dose? It is important not to miss your dose. Call your care team if you are unable to keep an appointment. What may interact with this medication? Do not take this medication with any of the following: Deferasirox Deferoxamine Dimercaprol This medication may also interact with the following: Other iron products This list may not describe all possible interactions. Give your health care provider a list of all the medicines, herbs, non-prescription drugs, or dietary supplements you use. Also tell them if you smoke, drink  alcohol, or use illegal drugs. Some items may interact with your medicine. What should I watch for while using this medication? Your condition will be monitored carefully while you are receiving this medication. Visit your care team for regular checks on your progress. You may need blood work while you are taking this medication. What side effects may I notice from receiving this medication? Side effects that you should report to your care team as soon as possible: Allergic reactions--skin rash, itching, hives, swelling of the face, lips, tongue, or throat Low blood pressure--dizziness, feeling faint or lightheaded, blurry vision Shortness of breath Side effects that usually do not require medical attention (report to your care team if they continue or are bothersome): Flushing Headache Joint pain Muscle pain Nausea Pain, redness, or irritation at injection site This list may not describe all possible side effects. Call your doctor for medical advice about side effects. You may report side effects to FDA at 1-800-FDA-1088. Where should I keep my medication? This medication is given in a hospital or clinic and will not be stored at home. NOTE: This sheet is a summary. It may not cover all possible information. If you have questions about this medicine, talk to your doctor, pharmacist, or health care provider.  2023 Elsevier/Gold Standard (2020-08-25 00:00:00)  

## 2022-02-02 ENCOUNTER — Other Ambulatory Visit: Payer: Self-pay | Admitting: Hematology

## 2022-02-02 DIAGNOSIS — D500 Iron deficiency anemia secondary to blood loss (chronic): Secondary | ICD-10-CM

## 2022-02-02 DIAGNOSIS — R040 Epistaxis: Secondary | ICD-10-CM

## 2022-02-04 ENCOUNTER — Other Ambulatory Visit: Payer: Self-pay | Admitting: Hematology

## 2022-02-04 DIAGNOSIS — R040 Epistaxis: Secondary | ICD-10-CM

## 2022-02-04 DIAGNOSIS — D500 Iron deficiency anemia secondary to blood loss (chronic): Secondary | ICD-10-CM

## 2022-02-04 NOTE — Telephone Encounter (Signed)
Pt never filled script at [place] on 01/24/22

## 2022-02-06 DIAGNOSIS — N3941 Urge incontinence: Secondary | ICD-10-CM | POA: Diagnosis not present

## 2022-02-06 DIAGNOSIS — N319 Neuromuscular dysfunction of bladder, unspecified: Secondary | ICD-10-CM | POA: Diagnosis not present

## 2022-02-07 ENCOUNTER — Inpatient Hospital Stay: Payer: Medicaid Other

## 2022-02-08 ENCOUNTER — Other Ambulatory Visit (HOSPITAL_COMMUNITY): Payer: Self-pay

## 2022-02-12 ENCOUNTER — Other Ambulatory Visit: Payer: Medicaid Other

## 2022-02-12 ENCOUNTER — Ambulatory Visit: Payer: Medicaid Other

## 2022-02-14 ENCOUNTER — Inpatient Hospital Stay: Payer: Medicaid Other | Attending: Hematology

## 2022-02-14 ENCOUNTER — Inpatient Hospital Stay: Payer: Medicaid Other

## 2022-02-14 DIAGNOSIS — I780 Hereditary hemorrhagic telangiectasia: Secondary | ICD-10-CM | POA: Insufficient documentation

## 2022-02-14 DIAGNOSIS — Z79899 Other long term (current) drug therapy: Secondary | ICD-10-CM | POA: Insufficient documentation

## 2022-02-14 DIAGNOSIS — Z5112 Encounter for antineoplastic immunotherapy: Secondary | ICD-10-CM | POA: Insufficient documentation

## 2022-02-14 DIAGNOSIS — D500 Iron deficiency anemia secondary to blood loss (chronic): Secondary | ICD-10-CM | POA: Insufficient documentation

## 2022-02-21 ENCOUNTER — Other Ambulatory Visit: Payer: Self-pay

## 2022-02-21 ENCOUNTER — Inpatient Hospital Stay: Payer: Medicaid Other

## 2022-02-21 VITALS — BP 154/75 | HR 61 | Temp 98.6°F | Resp 16 | Wt 231.0 lb

## 2022-02-21 DIAGNOSIS — Z5112 Encounter for antineoplastic immunotherapy: Secondary | ICD-10-CM | POA: Diagnosis not present

## 2022-02-21 DIAGNOSIS — D649 Anemia, unspecified: Secondary | ICD-10-CM

## 2022-02-21 DIAGNOSIS — D500 Iron deficiency anemia secondary to blood loss (chronic): Secondary | ICD-10-CM

## 2022-02-21 DIAGNOSIS — I780 Hereditary hemorrhagic telangiectasia: Secondary | ICD-10-CM

## 2022-02-21 DIAGNOSIS — Z95828 Presence of other vascular implants and grafts: Secondary | ICD-10-CM

## 2022-02-21 DIAGNOSIS — Z79899 Other long term (current) drug therapy: Secondary | ICD-10-CM | POA: Diagnosis not present

## 2022-02-21 LAB — CBC WITH DIFFERENTIAL (CANCER CENTER ONLY)
Abs Immature Granulocytes: 0.04 10*3/uL (ref 0.00–0.07)
Basophils Absolute: 0 10*3/uL (ref 0.0–0.1)
Basophils Relative: 0 %
Eosinophils Absolute: 0.2 10*3/uL (ref 0.0–0.5)
Eosinophils Relative: 2 %
HCT: 21.2 % — ABNORMAL LOW (ref 36.0–46.0)
Hemoglobin: 6.1 g/dL — CL (ref 12.0–15.0)
Immature Granulocytes: 0 %
Lymphocytes Relative: 14 %
Lymphs Abs: 1.4 10*3/uL (ref 0.7–4.0)
MCH: 23.8 pg — ABNORMAL LOW (ref 26.0–34.0)
MCHC: 28.8 g/dL — ABNORMAL LOW (ref 30.0–36.0)
MCV: 82.8 fL (ref 80.0–100.0)
Monocytes Absolute: 0.5 10*3/uL (ref 0.1–1.0)
Monocytes Relative: 5 %
Neutro Abs: 7.7 10*3/uL (ref 1.7–7.7)
Neutrophils Relative %: 79 %
Platelet Count: 259 10*3/uL (ref 150–400)
RBC: 2.56 MIL/uL — ABNORMAL LOW (ref 3.87–5.11)
RDW: 23.6 % — ABNORMAL HIGH (ref 11.5–15.5)
WBC Count: 9.9 10*3/uL (ref 4.0–10.5)
nRBC: 0 % (ref 0.0–0.2)

## 2022-02-21 LAB — TOTAL PROTEIN, URINE DIPSTICK: Protein, ur: 100 mg/dL — AB

## 2022-02-21 LAB — SAMPLE TO BLOOD BANK

## 2022-02-21 LAB — PREPARE RBC (CROSSMATCH)

## 2022-02-21 MED ORDER — SODIUM CHLORIDE 0.9% FLUSH
10.0000 mL | Freq: Once | INTRAVENOUS | Status: AC
  Administered 2022-02-21: 10 mL via INTRAVENOUS

## 2022-02-21 MED ORDER — SODIUM CHLORIDE 0.9% FLUSH
10.0000 mL | INTRAVENOUS | Status: DC | PRN
  Administered 2022-02-21: 10 mL

## 2022-02-21 MED ORDER — SODIUM CHLORIDE 0.9 % IV SOLN
250.0000 mg | Freq: Once | INTRAVENOUS | Status: AC
  Administered 2022-02-21: 250 mg via INTRAVENOUS
  Filled 2022-02-21: qty 20

## 2022-02-21 MED ORDER — HEPARIN SOD (PORK) LOCK FLUSH 100 UNIT/ML IV SOLN
500.0000 [IU] | Freq: Once | INTRAVENOUS | Status: AC | PRN
  Administered 2022-02-21: 500 [IU]

## 2022-02-21 MED ORDER — SODIUM CHLORIDE 0.9 % IV SOLN: Freq: Once | INTRAVENOUS | Status: AC

## 2022-02-21 MED ORDER — SODIUM CHLORIDE 0.9 % IV SOLN
5.0000 mg/kg | Freq: Once | INTRAVENOUS | Status: AC
  Administered 2022-02-21: 500 mg via INTRAVENOUS
  Filled 2022-02-21: qty 16

## 2022-02-21 NOTE — Patient Instructions (Signed)
[facility] MEDICAL ONCOLOGY  Discharge Instructions: Thank you for choosing [facility] to provide your oncology and hematology care.   If you have a lab appointment with the Cancer Center, please go directly to the Cancer Center and check in at the registration area.   Wear comfortable clothing and clothing appropriate for easy access to any Portacath or PICC line.   We strive to give you quality time with your provider. You may need to reschedule your appointment if you arrive late (15 or more minutes).  Arriving late affects you and other patients whose appointments are after yours.  Also, if you miss three or more appointments without notifying the office, you may be dismissed from the clinic at the provider's discretion.      For prescription refill requests, have your pharmacy contact our office and allow 72 hours for refills to be completed.    Today you received the following chemotherapy and/or immunotherapy agents: Bevacizumab      To help prevent nausea and vomiting after your treatment, we encourage you to take your nausea medication as directed.  BELOW ARE SYMPTOMS THAT SHOULD BE REPORTED IMMEDIATELY: *FEVER GREATER THAN 100.4 F (38 C) OR HIGHER *CHILLS OR SWEATING *NAUSEA AND VOMITING THAT IS NOT CONTROLLED WITH YOUR NAUSEA MEDICATION *UNUSUAL SHORTNESS OF BREATH *UNUSUAL BRUISING OR BLEEDING *URINARY PROBLEMS (pain or burning when urinating, or frequent urination) *BOWEL PROBLEMS (unusual diarrhea, constipation, pain near the anus) TENDERNESS IN MOUTH AND THROAT WITH OR WITHOUT PRESENCE OF ULCERS (sore throat, sores in mouth, or a toothache) UNUSUAL RASH, SWELLING OR PAIN  UNUSUAL VAGINAL DISCHARGE OR ITCHING   Items with * indicate a potential emergency and should be followed up as soon as possible or go to the Emergency Department if any problems should occur.  Please show the CHEMOTHERAPY ALERT CARD or IMMUNOTHERAPY ALERT CARD at check-in  to the Emergency Department and triage nurse.  Should you have questions after your visit or need to cancel or reschedule your appointment, please contact [facility] MEDICAL [facility]COL[facility]  Dept: [phone]  and follow the prompts.  Office hours are 8:00 a.m. to 4:30 p.m. Monday - Friday. Please note that voicemails left after 4:00 p.m. may not be returned until the following business day.  We are closed weekends and major holidays. You have access to a nurse at all times for urgent questions. Please call the main number to the clinic Dept: [phone] and follow the prompts.   For any non-urgent questions, you may also contact your provider using MyChart. We now offer e-Visits for anyone 18 and older to request care online for non-urgent symptoms. For details visit [email or web address].   Also download the MyChart app! Go to the app store, search "MyChart", open the app, select , and log in with your MyChart username and password.  Masks are optional in the cancer centers. If you would like for your care team to wear a mask while they are taking care of you, please let them know. You may have one support Marinell Igarashi who is at least 61 years old accompany you for your appointments. 

## 2022-02-21 NOTE — Progress Notes (Signed)
Okay to treat with HGB 6.1, patient to get 2 units of blood 02/22/22

## 2022-02-22 ENCOUNTER — Inpatient Hospital Stay: Payer: Medicaid Other

## 2022-02-22 VITALS — BP 185/80 | HR 63 | Temp 98.3°F | Resp 16 | Wt 231.7 lb

## 2022-02-22 DIAGNOSIS — D649 Anemia, unspecified: Secondary | ICD-10-CM

## 2022-02-22 DIAGNOSIS — I780 Hereditary hemorrhagic telangiectasia: Secondary | ICD-10-CM

## 2022-02-22 DIAGNOSIS — Z5112 Encounter for antineoplastic immunotherapy: Secondary | ICD-10-CM | POA: Diagnosis not present

## 2022-02-22 DIAGNOSIS — D500 Iron deficiency anemia secondary to blood loss (chronic): Secondary | ICD-10-CM

## 2022-02-22 DIAGNOSIS — Z95828 Presence of other vascular implants and grafts: Secondary | ICD-10-CM

## 2022-02-22 MED ORDER — SODIUM CHLORIDE 0.9% IV SOLUTION
250.0000 mL | Freq: Once | INTRAVENOUS | Status: AC
  Administered 2022-02-22: 250 mL via INTRAVENOUS

## 2022-02-22 MED ORDER — HEPARIN SOD (PORK) LOCK FLUSH 100 UNIT/ML IV SOLN
500.0000 [IU] | Freq: Once | INTRAVENOUS | Status: AC
  Administered 2022-02-22: 500 [IU]

## 2022-02-22 MED ORDER — SODIUM CHLORIDE 0.9% FLUSH
10.0000 mL | Freq: Once | INTRAVENOUS | Status: AC
  Administered 2022-02-22: 10 mL

## 2022-02-22 MED ORDER — ACETAMINOPHEN 325 MG PO TABS
650.0000 mg | ORAL_TABLET | Freq: Once | ORAL | Status: AC
  Administered 2022-02-22: 650 mg via ORAL
  Filled 2022-02-22: qty 2

## 2022-02-22 MED ORDER — DIPHENHYDRAMINE HCL 25 MG PO CAPS
25.0000 mg | ORAL_CAPSULE | Freq: Once | ORAL | Status: AC
  Administered 2022-02-22: 25 mg via ORAL
  Filled 2022-02-22: qty 1

## 2022-02-22 NOTE — Patient Instructions (Signed)

## 2022-02-22 NOTE — Progress Notes (Signed)
Blood admin start time charted at 1212pm, actual blood start time 1215pm. Blood admin sheet reflects correct time. Blood product verified by 2 Rns prior to administration, vitals rechecked at 1230, 15 mins post blood start as scheduled per blood administration policy. See flowsheets for further details.

## 2022-02-25 LAB — BPAM RBC
Blood Product Expiration Date: 202311302359
Blood Product Expiration Date: 202311302359
ISSUE DATE / TIME: 202311100948
ISSUE DATE / TIME: 202311100948
Unit Type and Rh: 600
Unit Type and Rh: 600

## 2022-02-25 LAB — TYPE AND SCREEN
ABO/RH(D): A NEG
Antibody Screen: NEGATIVE
Unit division: 0
Unit division: 0

## 2022-02-28 ENCOUNTER — Inpatient Hospital Stay: Payer: Medicaid Other

## 2022-03-01 ENCOUNTER — Other Ambulatory Visit: Payer: Self-pay

## 2022-03-05 ENCOUNTER — Encounter: Payer: Medicaid Other | Admitting: Student

## 2022-03-05 MED ORDER — ALPRAZOLAM 0.5 MG PO TABS: 0.5000 mg | ORAL_TABLET | Freq: Every evening | ORAL | 0 refills | Status: DC | PRN

## 2022-03-06 ENCOUNTER — Inpatient Hospital Stay: Payer: Medicaid Other

## 2022-03-14 ENCOUNTER — Inpatient Hospital Stay: Payer: Medicaid Other

## 2022-03-18 ENCOUNTER — Encounter: Payer: Self-pay | Admitting: Student

## 2022-03-18 ENCOUNTER — Encounter: Payer: Medicaid Other | Admitting: Student

## 2022-03-19 NOTE — Progress Notes (Deleted)
[place]   [phone] [phone]   Clinic Follow up Note   Patient Care Team: Drucie Opitz, MD as PCP - General  Date of Service:  03/19/2022  CHIEF COMPLAINT: f/u of HHT and anemia   CURRENT THERAPY:  -Blood transfusion for Hg<8.0 (2units on different days if Hg<7) -iv ferric gluconate, with ferritin goal 100-200.  -Avastin/Zirabev restarted on 04/03/18. Currently given PRN due to proteinuria. -Amicar 1g BID, intermittently since 07/17/18    ASSESSMENT: *** Peggy House is a 61 y.o. female with   No problem-specific Assessment & Plan notes found for this encounter.  ***   PLAN:  SUMMARY OF ONCOLOGIC HISTORY: Oncology History   No history exists.     INTERVAL HISTORY: *** Peggy House is here for a follow up of HHT and anemia  She was last seen by me on 01/24/2022 She presents to the clinic      All other systems were reviewed with the patient and are negative.  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    SURGICAL HISTORY: Past Surgical History:  Procedure Laterality Date   ABDOMINAL HYSTERECTOMY     CARPAL TUNNEL RELEASE  05/13/2011   Procedure: CARPAL TUNNEL RELEASE;  Surgeon: Nita Sells, MD;  Location: [place];  Service: Orthopedics;  Laterality: Left;   COLONOSCOPY N/A 03/02/2020   Procedure: COLONOSCOPY;  Surgeon: Ronald Lobo, MD;  Location: [facility] ENDOSCOPY;  Service: Endoscopy;  Laterality: N/A;   COLONOSCOPY WITH PROPOFOL N/A 04/28/2014   Procedure: COLONOSCOPY WITH PROPOFOL;  Surgeon: Cleotis Nipper,  MD;  Location: [facility] ENDOSCOPY;  Service: Endoscopy;  Laterality: N/A;   DG TOES*L*  2/10   rt   DILATION AND CURETTAGE OF UTERUS     ENTEROSCOPY N/A 10/17/2017   Procedure: ENTEROSCOPY;  Surgeon: Otis Brace, MD;  Location: [place];  Service: Gastroenterology;  Laterality: N/A;   ESOPHAGOGASTRODUODENOSCOPY N/A 04/10/2014   Procedure: ESOPHAGOGASTRODUODENOSCOPY (EGD);  Surgeon: [name], MD;  Location[facility] ENDOSCOPY;  Service: Endoscopy;  Laterality: N/A;   ESOPHAGOGASTRODUODENOSCOPY N/A 05/10/2017   Procedure: ESOPHAGOGASTRODUODENOSCOPY (EGD);  Surgeon: Ronald Lobo, MD;  Location: [facility] ENDOSCOPY;  Service: Endoscopy;  Laterality: N/A;   ESOPHAGOGASTRODUODENOSCOPY N/A 09/22/2017   Procedure: ESOPHAGOGASTRODUODENOSCOPY (EGD);  Surgeon: Clarene Essex, MD;  Location: [place];  Service: Endoscopy;  Laterality: N/A;  bedside   ESOPHAGOGASTRODUODENOSCOPY N/A 03/02/2020   Procedure: ESOPHAGOGASTRODUODENOSCOPY (EGD);  Surgeon: Ronald Lobo, MD;  Location: [facility];  Service: Endoscopy;  Laterality: N/A;   ESOPHAGOGASTRODUODENOSCOPY N/A 11/03/2020   Procedure: ESOPHAGOGASTRODUODENOSCOPY (EGD);  Surgeon: Wilford Corner, MD;  Location: [place];  Service: Endoscopy;  Laterality: N/A;   ESOPHAGOGASTRODUODENOSCOPY (EGD) WITH PROPOFOL N/A 04/27/2014   Procedure: ESOPHAGOGASTRODUODENOSCOPY (EGD) WITH PROPOFOL;  Surgeon: Cleotis Nipper, MD;  Location: [place];  Service: Endoscopy;  Laterality: N/A;  possible apc   ESOPHAGOGASTRODUODENOSCOPY (EGD) WITH PROPOFOL N/A 09/30/2017   Procedure: ESOPHAGOGASTRODUODENOSCOPY (EGD) WITH PROPOFOL;  Surgeon: [name], MD;  Location: [place];  Service: Gastroenterology;  Laterality: N/A;   ESOPHAGOGASTRODUODENOSCOPY (EGD) WITH PROPOFOL N/A 10/01/2017  Procedure: ESOPHAGOGASTRODUODENOSCOPY (EGD) WITH PROPOFOL;  Surgeon: [name], MD;  Location: [place];  Service: Gastroenterology;  Laterality: N/A;    ESOPHAGOGASTRODUODENOSCOPY (EGD) WITH PROPOFOL N/A 10/08/2017   Procedure: ESOPHAGOGASTRODUODENOSCOPY (EGD) WITH PROPOFOL;  Surgeon: Otis Brace, MD;  Location: [place];  Service: Gastroenterology;  Laterality: N/A;   ESOPHAGOGASTRODUODENOSCOPY (EGD) WITH PROPOFOL N/A 10/17/2017   Procedure: ESOPHAGOGASTRODUODENOSCOPY (EGD) WITH PROPOFOL;  Surgeon: Otis Brace, MD;  Location: [place];  Service: Gastroenterology;  Laterality: N/A;   ESOPHAGOGASTRODUODENOSCOPY (EGD) WITH PROPOFOL N/A 10/19/2017   Procedure: ESOPHAGOGASTRODUODENOSCOPY (EGD) WITH PROPOFOL;  Surgeon: Otis Brace, MD;  Location: [place];  Service: Gastroenterology;  Laterality: N/A;   ESOPHAGOGASTRODUODENOSCOPY (EGD) WITH PROPOFOL N/A 12/04/2018   Procedure: ESOPHAGOGASTRODUODENOSCOPY (EGD) WITH PROPOFOL;  Surgeon: Wilford Corner, MD;  Location: [facility]OS[facility];  Service: Endoscopy;  Laterality: N/A;   GIVENS CAPSULE STUDY N/A 10/02/2017   Procedure: GIVENS CAPSULE STUDY;  Surgeon: [name], MD;  Location: [place];  Service: Gastroenterology;  Laterality: N/A;   GIVENS CAPSULE STUDY N/A 10/08/2017   Procedure: GIVENS CAPSULE STUDY;  Surgeon: Otis Brace, MD;  Location: [place];  Service: Gastroenterology;  Laterality: N/A;  endoscopic placement of capsule   GIVENS CAPSULE STUDY N/A 03/02/2020   Procedure: GIVENS CAPSULE STUDY;  Surgeon: Ronald Lobo, MD;  Location: [facility];  Service: Endoscopy;  Laterality: N/A;   HEMOSTASIS CLIP PLACEMENT  12/04/2018   Procedure: HEMOSTASIS CLIP PLACEMENT;  Surgeon: Wilford Corner, MD;  Location: [facility];  Service: Endoscopy;;   HOT HEMOSTASIS N/A 04/27/2014   Procedure: HOT HEMOSTASIS (ARGON PLASMA COAGULATION/BICAP);  Surgeon: Cleotis Nipper, MD;  Location: [facility] ENDOSCOPY;  Service: Endoscopy;  Laterality: N/A;   HOT HEMOSTASIS N/A 09/30/2017   Procedure: HOT HEMOSTASIS (ARGON PLASMA COAGULATION/BICAP);  Surgeon: [name], MD;  Location: [place];  Service: Gastroenterology;  Laterality: N/A;   HOT HEMOSTASIS N/A 10/01/2017   Procedure: HOT HEMOSTASIS (ARGON PLASMA COAGULATION/BICAP);  Surgeon: [name], MD;  Location: [place];  Service: Gastroenterology;  Laterality: N/A;   HOT HEMOSTASIS N/A 10/17/2017   Procedure: HOT HEMOSTASIS (ARGON PLASMA COAGULATION/BICAP);  Surgeon: Otis Brace, MD;  Location: [facility] ENDOSCOPY;  Service: Gastroenterology;  Laterality: N/A;   HOT HEMOSTASIS N/A 10/19/2017   Procedure: HOT HEMOSTASIS (ARGON PLASMA COAGULATION/BICAP);  Surgeon: Otis Brace, MD;  Location: [facility] ENDOSCOPY;  Service: Gastroenterology;  Laterality: N/A;   HOT HEMOSTASIS N/A 03/02/2020   Procedure: HOT HEMOSTASIS (ARGON PLASMA COAGULATION/BICAP);  Surgeon: Ronald Lobo, MD;  Location: [facility];  Service: Endoscopy;  Laterality: N/A;   IR IMAGING GUIDED PORT INSERTION  07/08/2018   L shoulder Surgery  2011   POLYPECTOMY  03/02/2020   Procedure: POLYPECTOMY;  Surgeon: Ronald Lobo, MD;  Location: [facility];  Service: Endoscopy;;   SCLEROTHERAPY  11/03/2020   Procedure: Clide Deutscher;  Surgeon: Wilford Corner, MD;  Location: [facility] ENDOSCOPY;  Service: Endoscopy;;   SUBMUCOSAL INJECTION  09/22/2017   Procedure: SUBMUCOSAL INJECTION;  Surgeon: Clarene Essex, MD;  Location: [place];  Service: Endoscopy;;   SUBMUCOSAL INJECTION  12/04/2018   Procedure: SUBMUCOSAL INJECTION;  Surgeon: Wilford Corner, MD;  Location: [facility]OS[facility];  Service: Endoscopy;;    I have reviewed the social history and family history with the patient and they are unchanged from previous note.  ALLERGIES:  is allergic to feraheme [ferumoxytol], nsaids, tomato, iron (ferrous sulfate) [ferrous sulfate er], other, and wasp venom.  MEDICATIONS:  Current Outpatient Medications  Medication Sig Dispense Refill   Accu-Chek Softclix Lancets lancets Use to check blood sugar before  breakfast and before dinner while on steroids (Patient taking  differently: 1 each by Other route See admin instructions. Use to check blood sugar before breakfast and before dinner while on steroids) 100 each 1   acitretin (SORIATANE) 10 MG capsule Take 10 mg by mouth daily.     ALPRAZolam (XANAX) 0.5 MG tablet Take 1 tablet (0.5 mg total) by mouth at bedtime as needed for anxiety or sleep. 30 tablet 0   Aminocaproic Acid 1000 MG TABS TAKE 1 TABLET(1000 MG) BY MOUTH IN THE MORNING AND AT BEDTIME 60 tablet 2   Aminocaproic Acid 1000 MG TABS TAKE 1 TABLET(1000 MG) BY MOUTH IN THE MORNING AND AT BEDTIME 60 tablet 2   amLODipine (NORVASC) 10 MG tablet Take 1 tablet (10 mg total) by mouth daily. 90 tablet 3   atorvastatin (LIPITOR) 80 MG tablet TAKE 1 TABLET(80 MG) BY MOUTH DAILY 30 tablet 3   diphenoxylate-atropine (LOMOTIL) 2.5-0.025 MG tablet 1 to 2 PO QID prn diarrhea 30 tablet 1   furosemide (LASIX) 40 MG tablet Take 40 mg by mouth daily.     glucose blood (ACCU-CHEK GUIDE) test strip Check blood sugar 2 times per day while on steroids before breakfast and dinner (Patient taking differently: 1 each by Other route See admin instructions. Check blood sugar 2 times per day while on steroids before breakfast and dinner) 50 each 3   hydrochlorothiazide (HYDRODIURIL) 12.5 MG tablet TAKE 1 TABLET(12.5 MG) BY MOUTH DAILY 90 tablet 1   lidocaine-prilocaine (EMLA) cream Apply 1 application topically as needed. 30 g 0   metFORMIN (GLUCOPHAGE) 500 MG tablet TAKE 1 TABLET(500 MG) BY MOUTH TWICE DAILY WITH A MEAL 180 tablet 3   methocarbamol (ROBAXIN) 500 MG tablet Take 1 tablet (500 mg total) by mouth every 8 (eight) hours as needed for muscle spasms (back pain/spasm). 7 tablet 0   metoprolol succinate (TOPROL-XL) 100 MG 24 hr tablet Take 100 mg by mouth daily.     pantoprazole (PROTONIX) 40 MG tablet TAKE 1 TABLET(40 MG) BY MOUTH TWICE DAILY 60 tablet 5   Podiatric Products (FLEXITOL HEEL BALM) OINT Apply 1 application topically as needed (dry skin).      potassium  chloride (KLOR-CON) 20 MEQ packet Take 20 mEq by mouth daily. 30 packet 1   PROAIR HFA 108 (90 Base) MCG/ACT inhaler INHALE 1 TO 2 PUFFS INTO THE LUNGS EVERY 6 HOURS AS NEEDED FOR WHEEZING OR SHORTNESS OF BREATH 8.5 g 0   prochlorperazine (COMPAZINE) 10 MG tablet Take 1 tablet (10 mg total) by mouth every 6 (six) hours as needed for nausea or vomiting. 30 tablet 1   sertraline (ZOLOFT) 100 MG tablet Take 1 tablet (100 mg total) by mouth daily. 30 tablet 2   sertraline (ZOLOFT) 25 MG tablet Take 5 tablets (125 mg total) by mouth daily. 150 tablet 2   terbinafine (LAMISIL AT) 1 % cream Apply 1 application topically 2 (two) times daily. Both bottom and top of both feet and toes 36 g 0   traZODone (DESYREL) 100 MG tablet TAKE 1 TABLET(100 MG) BY MOUTH AT BEDTIME 30 tablet 2   No current facility-administered medications for this visit.   Facility-Administered Medications Ordered in Other Visits  Medication Dose Route Frequency Provider Last Rate Last Admin   diphenhydrAMINE (BENADRYL) 25 mg capsule            diphenhydrAMINE (BENADRYL) 25 mg capsule            phenylephrine (NEO-SYNEPHRINE) 1 %  nasal drops 2 drop  2 drop Left Nare Once Truitt Merle, MD        PHYSICAL EXAMINATION: ECOG PERFORMANCE STATUS: {CHL ONC ECOG PS:[id number]}  There were no vitals filed for this visit. Wt Readings from Last 3 Encounters:  02/22/22 231 lb 11.2 oz (105.1 kg)  02/21/22 231 lb 0.4 oz (104.8 kg)  01/31/22 235 lb 6.4 oz (106.8 kg)    {Only keep what was examined. If exam not performed, can use .CEXAM } GENERAL:alert, no distress and comfortable SKIN: skin color, texture, turgor are normal, no rashes or significant lesions EYES: normal, Conjunctiva are pink and non-injected, sclera clear {OROPHARYNX:no exudate, no erythema and lips, buccal mucosa, and tongue normal}  NECK: supple, thyroid normal size, non-tender, without nodularity LYMPH:  no palpable lymphadenopathy in the cervical, axillary {or  inguinal} LUNGS: clear to auscultation and percussion with normal breathing effort HEART: regular rate & rhythm and no murmurs and no lower extremity edema ABDOMEN:abdomen soft, non-tender and normal bowel sounds Musculoskeletal:no cyanosis of digits and no clubbing  NEURO: alert & oriented x 3 with fluent speech, no focal motor/sensory deficits  LABORATORY DATA:  I have reviewed the data as listed    Latest Ref Rng & Units 02/21/2022   11:30 AM 01/31/2022    2:05 PM 01/24/2022   12:18 PM  CBC  WBC 4.0 - 10.5 K/uL 9.9  9.2  10.5   Hemoglobin 12.0 - 15.0 g/dL 6.1  7.2  6.1   Hematocrit 36.0 - 46.0 % 21.2  24.3  21.0   Platelets 150 - 400 K/uL 259  284  258         Latest Ref Rng & Units 09/13/2021   11:40 AM 07/19/2021   12:46 PM 06/29/2021   12:19 PM  CMP  Glucose 70 - 99 mg/dL 114  96  94   BUN 6 - 20 mg/dL '7  10  16   '$ Creatinine 0.44 - 1.00 mg/dL 0.77  0.86  1.08   Sodium 135 - 145 mmol/L 142  141  139   Potassium 3.5 - 5.1 mmol/L 3.4  3.7  3.4   Chloride 98 - 111 mmol/L 109  107  104   CO2 22 - 32 mmol/L '27  26  26   '$ Calcium 8.9 - 10.3 mg/dL 9.3  9.0  9.7   Total Protein 6.5 - 8.1 g/dL 6.8  7.5  7.9   Total Bilirubin 0.3 - 1.2 mg/dL 0.3  0.2  0.3   Alkaline Phos 38 - 126 U/L 57  67  66   AST 15 - 41 U/L '12  12  13   '$ ALT 0 - 44 U/L '5  7  5       '$ RADIOGRAPHIC STUDIES: I have personally reviewed the radiological images as listed and agreed with the findings in the report. No results found.    No orders of the defined types were placed in this encounter.  All questions were answered. The patient knows to call the clinic with any problems, questions or concerns. No barriers to learning was detected. The total time spent in the appointment was {CHL ONC TIME VISIT - VELFY:[id number]}.     Baldemar Friday, CMA 03/19/2022   I, Audry Riles, CMA, am acting as scribe for Truitt Merle, MD.   {Add scribe attestation statement}

## 2022-03-21 ENCOUNTER — Inpatient Hospital Stay: Payer: Medicaid Other

## 2022-03-21 ENCOUNTER — Inpatient Hospital Stay: Payer: Medicaid Other | Attending: Hematology | Admitting: Hematology

## 2022-03-21 DIAGNOSIS — Z7984 Long term (current) use of oral hypoglycemic drugs: Secondary | ICD-10-CM | POA: Insufficient documentation

## 2022-03-21 DIAGNOSIS — E119 Type 2 diabetes mellitus without complications: Secondary | ICD-10-CM | POA: Insufficient documentation

## 2022-03-21 DIAGNOSIS — I10 Essential (primary) hypertension: Secondary | ICD-10-CM | POA: Insufficient documentation

## 2022-03-21 DIAGNOSIS — I780 Hereditary hemorrhagic telangiectasia: Secondary | ICD-10-CM | POA: Insufficient documentation

## 2022-03-21 DIAGNOSIS — D500 Iron deficiency anemia secondary to blood loss (chronic): Secondary | ICD-10-CM | POA: Insufficient documentation

## 2022-03-21 DIAGNOSIS — Z5112 Encounter for antineoplastic immunotherapy: Secondary | ICD-10-CM | POA: Insufficient documentation

## 2022-03-21 DIAGNOSIS — Z79899 Other long term (current) drug therapy: Secondary | ICD-10-CM | POA: Insufficient documentation

## 2022-03-21 DIAGNOSIS — F32A Depression, unspecified: Secondary | ICD-10-CM | POA: Insufficient documentation

## 2022-03-21 NOTE — Progress Notes (Deleted)
[place]   [phone] [phone]   Clinic Follow up Note   Patient Care Team: Drucie Opitz, MD as PCP - General  Date of Service:  03/21/2022  CHIEF COMPLAINT: f/u of  HHT and anemia   CURRENT THERAPY:  -Blood transfusion for Hg<8.0 (2units on different days if Hg<7) -iv ferric gluconate, with ferritin goal 100-200.  -Avastin/Zirabev restarted on 04/03/18. Currently given PRN due to proteinuria. -Amicar 1g BID, intermittently since 07/17/18  ASSESSMENT: *** Peggy House is a 61 y.o. female with   No problem-specific Assessment & Plan notes found for this encounter.  ***   PLAN:  SUMMARY OF ONCOLOGIC HISTORY: Oncology History   No history exists.     INTERVAL HISTORY: *** Peggy House is here for a follow up of HHT and anemia She was last seen by me on 01/24/2022 She presents to the clinic    All other systems were reviewed with the patient and are negative.  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    SURGICAL HISTORY: Past Surgical History:  Procedure Laterality Date   ABDOMINAL HYSTERECTOMY     CARPAL TUNNEL RELEASE  05/13/2011   Procedure: CARPAL TUNNEL RELEASE;  Surgeon: Nita Sells, MD;  Location: [place];  Service: Orthopedics;  Laterality: Left;   COLONOSCOPY N/A 03/02/2020   Procedure: COLONOSCOPY;  Surgeon: Ronald Lobo, MD;  Location: [facility] ENDOSCOPY;  Service: Endoscopy;  Laterality: N/A;   COLONOSCOPY WITH PROPOFOL N/A 04/28/2014   Procedure: COLONOSCOPY WITH PROPOFOL;  Surgeon: Cleotis Nipper, MD;   Location: [facility] ENDOSCOPY;  Service: Endoscopy;  Laterality: N/A;   DG TOES*L*  2/10   rt   DILATION AND CURETTAGE OF UTERUS     ENTEROSCOPY N/A 10/17/2017   Procedure: ENTEROSCOPY;  Surgeon: Otis Brace, MD;  Location: [place];  Service: Gastroenterology;  Laterality: N/A;   ESOPHAGOGASTRODUODENOSCOPY N/A 04/10/2014   Procedure: ESOPHAGOGASTRODUODENOSCOPY (EGD);  Surgeon: [name], MD;  Location: [facility] ENDOSCOPY;  Service: Endoscopy;  Laterality: N/A;   ESOPHAGOGASTRODUODENOSCOPY N/A 05/10/2017   Procedure: ESOPHAGOGASTRODUODENOSCOPY (EGD);  Surgeon: Ronald Lobo, MD;  Location: [facility] ENDOSCOPY;  Service: Endoscopy;  Laterality: N/A;   ESOPHAGOGASTRODUODENOSCOPY N/A 09/22/2017   Procedure: ESOPHAGOGASTRODUODENOSCOPY (EGD);  Surgeon: Clarene Essex, MD;  Location: [place];  Service: Endoscopy;  Laterality: N/A;  bedside   ESOPHAGOGASTRODUODENOSCOPY N/A 03/02/2020   Procedure: ESOPHAGOGASTRODUODENOSCOPY (EGD);  Surgeon: Ronald Lobo, MD;  Location: [facility];  Service: Endoscopy;  Laterality: N/A;   ESOPHAGOGASTRODUODENOSCOPY N/A 11/03/2020   Procedure: ESOPHAGOGASTRODUODENOSCOPY (EGD);  Surgeon: Wilford Corner, MD;  Location: [place];  Service: Endoscopy;  Laterality: N/A;   ESOPHAGOGASTRODUODENOSCOPY (EGD) WITH PROPOFOL N/A 04/27/2014   Procedure: ESOPHAGOGASTRODUODENOSCOPY (EGD) WITH PROPOFOL;  Surgeon: Cleotis Nipper, MD;  Location: [place];  Service: Endoscopy;  Laterality: N/A;  possible apc   ESOPHAGOGASTRODUODENOSCOPY (EGD) WITH PROPOFOL N/A 09/30/2017   Procedure: ESOPHAGOGASTRODUODENOSCOPY (EGD) WITH PROPOFOL;  Surgeon: [name], MD;  Location: [place];  Service: Gastroenterology;  Laterality: N/A;   ESOPHAGOGASTRODUODENOSCOPY (EGD) WITH PROPOFOL N/A 10/01/2017   Procedure: ESOPHAGOGASTRODUODENOSCOPY (EGD) WITH  PROPOFOL;  Surgeon: [name], MD;  Location: [place];  Service: Gastroenterology;  Laterality: N/A;   ESOPHAGOGASTRODUODENOSCOPY  (EGD) WITH PROPOFOL N/A 10/08/2017   Procedure: ESOPHAGOGASTRODUODENOSCOPY (EGD) WITH PROPOFOL;  Surgeon: Otis Brace, MD;  Location: [place];  Service: Gastroenterology;  Laterality: N/A;   ESOPHAGOGASTRODUODENOSCOPY (EGD) WITH PROPOFOL N/A 10/17/2017   Procedure: ESOPHAGOGASTRODUODENOSCOPY (EGD) WITH PROPOFOL;  Surgeon: Otis Brace, MD;  Location: [place];  Service: Gastroenterology;  Laterality: N/A;   ESOPHAGOGASTRODUODENOSCOPY (EGD) WITH PROPOFOL N/A 10/19/2017   Procedure: ESOPHAGOGASTRODUODENOSCOPY (EGD) WITH PROPOFOL;  Surgeon: Otis Brace, MD;  Location: [place];  Service: Gastroenterology;  Laterality: N/A;   ESOPHAGOGASTRODUODENOSCOPY (EGD) WITH PROPOFOL N/A 12/04/2018   Procedure: ESOPHAGOGASTRODUODENOSCOPY (EGD) WITH PROPOFOL;  Surgeon: Wilford Corner, MD;  Location: [facility]OS[facility];  Service: Endoscopy;  Laterality: N/A;   GIVENS CAPSULE STUDY N/A 10/02/2017   Procedure: GIVENS CAPSULE STUDY;  Surgeon: [name], MD;  Location: [place];  Service: Gastroenterology;  Laterality: N/A;   GIVENS CAPSULE STUDY N/A 10/08/2017   Procedure: GIVENS CAPSULE STUDY;  Surgeon: Otis Brace, MD;  Location: [place];  Service: Gastroenterology;  Laterality: N/A;  endoscopic placement of capsule   GIVENS CAPSULE STUDY N/A 03/02/2020   Procedure: GIVENS CAPSULE STUDY;  Surgeon: Ronald Lobo, MD;  Location: [facility];  Service: Endoscopy;  Laterality: N/A;   HEMOSTASIS CLIP PLACEMENT  12/04/2018   Procedure: HEMOSTASIS CLIP PLACEMENT;  Surgeon: Wilford Corner, MD;  Location: [facility];  Service: Endoscopy;;   HOT HEMOSTASIS N/A 04/27/2014   Procedure: HOT HEMOSTASIS (ARGON PLASMA COAGULATION/BICAP);  Surgeon: Cleotis Nipper, MD;  Location: [facility] ENDOSCOPY;  Service: Endoscopy;  Laterality: N/A;   HOT HEMOSTASIS N/A 09/30/2017   Procedure: HOT HEMOSTASIS (ARGON PLASMA COAGULATION/BICAP);  Surgeon: [name], MD;  Location: [place];  Service:  Gastroenterology;  Laterality: N/A;   HOT HEMOSTASIS N/A 10/01/2017   Procedure: HOT HEMOSTASIS (ARGON PLASMA COAGULATION/BICAP);  Surgeon: [name], MD;  Location: [place];  Service: Gastroenterology;  Laterality: N/A;   HOT HEMOSTASIS N/A 10/17/2017   Procedure: HOT HEMOSTASIS (ARGON PLASMA COAGULATION/BICAP);  Surgeon: Otis Brace, MD;  Location: [facility] ENDOSCOPY;  Service: Gastroenterology;  Laterality: N/A;   HOT HEMOSTASIS N/A 10/19/2017   Procedure: HOT HEMOSTASIS (ARGON PLASMA COAGULATION/BICAP);  Surgeon: Otis Brace, MD;  Location: [facility] ENDOSCOPY;  Service: Gastroenterology;  Laterality: N/A;   HOT HEMOSTASIS N/A 03/02/2020   Procedure: HOT HEMOSTASIS (ARGON PLASMA COAGULATION/BICAP);  Surgeon: Ronald Lobo, MD;  Location: [facility];  Service: Endoscopy;  Laterality: N/A;   IR IMAGING GUIDED PORT INSERTION  07/08/2018   L shoulder Surgery  2011   POLYPECTOMY  03/02/2020   Procedure: POLYPECTOMY;  Surgeon: Ronald Lobo, MD;  Location: [facility]OS[facility];  Service: Endoscopy;;   SCLEROTHERAPY  11/03/2020   Procedure: Clide Deutscher;  Surgeon: Wilford Corner, MD;  Location: [facility] ENDOSCOPY;  Service: Endoscopy;;   SUBMUCOSAL INJECTION  09/22/2017   Procedure: SUBMUCOSAL INJECTION;  Surgeon: Clarene Essex, MD;  Location: [place];  Service: Endoscopy;;   SUBMUCOSAL INJECTION  12/04/2018   Procedure: SUBMUCOSAL INJECTION;  Surgeon: Wilford Corner, MD;  Location: [facility] ENDOS[facility];  Service: Endoscopy;;    I have reviewed the social history and family history with the patient and they are unchanged from previous note.  ALLERGIES:  is allergic to feraheme [ferumoxytol], nsaids, tomato, iron (ferrous sulfate) [ferrous sulfate er], other, and wasp venom.  MEDICATIONS:  Current Outpatient Medications  Medication Sig Dispense Refill   Accu-Chek Softclix Lancets lancets Use to check blood sugar before breakfast and before dinner  while on steroids (Patient taking differently: 1 each  by Other route See admin instructions. Use to check blood sugar before breakfast and before dinner while on steroids) 100 each 1   acitretin (SORIATANE) 10 MG capsule Take 10 mg by mouth daily.     ALPRAZolam (XANAX) 0.5 MG tablet Take 1 tablet (0.5 mg total) by mouth at bedtime as needed for anxiety or sleep. 30 tablet 0   Aminocaproic Acid 1000 MG TABS TAKE 1 TABLET(1000 MG) BY MOUTH IN THE MORNING AND AT BEDTIME 60 tablet 2   Aminocaproic Acid 1000 MG TABS TAKE 1 TABLET(1000 MG) BY MOUTH IN THE MORNING AND AT BEDTIME 60 tablet 2   amLODipine (NORVASC) 10 MG tablet Take 1 tablet (10 mg total) by mouth daily. 90 tablet 3   atorvastatin (LIPITOR) 80 MG tablet TAKE 1 TABLET(80 MG) BY MOUTH DAILY 30 tablet 3   diphenoxylate-atropine (LOMOTIL) 2.5-0.025 MG tablet 1 to 2 PO QID prn diarrhea 30 tablet 1   furosemide (LASIX) 40 MG tablet Take 40 mg by mouth daily.     glucose blood (ACCU-CHEK GUIDE) test strip Check blood sugar 2 times per day while on steroids before breakfast and dinner (Patient taking differently: 1 each by Other route See admin instructions. Check blood sugar 2 times per day while on steroids before breakfast and dinner) 50 each 3   hydrochlorothiazide (HYDRODIURIL) 12.5 MG tablet TAKE 1 TABLET(12.5 MG) BY MOUTH DAILY 90 tablet 1   lidocaine-prilocaine (EMLA) cream Apply 1 application topically as needed. 30 g 0   metFORMIN (GLUCOPHAGE) 500 MG tablet TAKE 1 TABLET(500 MG) BY MOUTH TWICE DAILY WITH A MEAL 180 tablet 3   methocarbamol (ROBAXIN) 500 MG tablet Take 1 tablet (500 mg total) by mouth every 8 (eight) hours as needed for muscle spasms (back pain/spasm). 7 tablet 0   metoprolol succinate (TOPROL-XL) 100 MG 24 hr tablet Take 100 mg by mouth daily.     pantoprazole (PROTONIX) 40 MG tablet TAKE 1 TABLET(40 MG) BY MOUTH TWICE DAILY 60 tablet 5   Podiatric Products (FLEXITOL HEEL BALM) OINT Apply 1 application topically as needed (dry skin).      potassium chloride (KLOR-CON) 20 MEQ  packet Take 20 mEq by mouth daily. 30 packet 1   PROAIR HFA 108 (90 Base) MCG/ACT inhaler INHALE 1 TO 2 PUFFS INTO THE LUNGS EVERY 6 HOURS AS NEEDED FOR WHEEZING OR SHORTNESS OF BREATH 8.5 g 0   prochlorperazine (COMPAZINE) 10 MG tablet Take 1 tablet (10 mg total) by mouth every 6 (six) hours as needed for nausea or vomiting. 30 tablet 1   sertraline (ZOLOFT) 100 MG tablet Take 1 tablet (100 mg total) by mouth daily. 30 tablet 2   sertraline (ZOLOFT) 25 MG tablet Take 5 tablets (125 mg total) by mouth daily. 150 tablet 2   terbinafine (LAMISIL AT) 1 % cream Apply 1 application topically 2 (two) times daily. Both bottom and top of both feet and toes 36 g 0   traZODone (DESYREL) 100 MG tablet TAKE 1 TABLET(100 MG) BY MOUTH AT BEDTIME 30 tablet 2   No current facility-administered medications for this visit.   Facility-Administered Medications Ordered in Other Visits  Medication Dose Route Frequency Provider Last Rate Last Admin   diphenhydrAMINE (BENADRYL) 25 mg capsule            diphenhydrAMINE (BENADRYL) 25 mg capsule            phenylephrine (NEO-SYNEPHRINE) 1 % nasal drops 2 drop  2 drop Left Nare Once Truitt Merle, MD        PHYSICAL EXAMINATION: ECOG PERFORMANCE STATUS: {CHL ONC ECOG KT:[id number]}  There were no vitals filed for this visit. Wt Readings from Last 3 Encounters:  02/22/22 231 lb 11.2 oz (105.1 kg)  02/21/22 231 lb 0.4 oz (104.8 kg)  01/31/22 235 lb 6.4 oz (106.8 kg)    {Only keep what was examined. If exam not performed, can use .CEXAM } GENERAL:alert, no distress and comfortable SKIN: skin color, texture, turgor are normal, no rashes or significant lesions EYES: normal, Conjunctiva are pink and non-injected, sclera clear {OROPHARYNX:no exudate, no erythema and lips, buccal mucosa, and tongue normal}  NECK: supple, thyroid normal size, non-tender, without nodularity LYMPH:  no palpable lymphadenopathy in the cervical, axillary {or inguinal} LUNGS: clear to  auscultation and percussion with normal breathing effort HEART: regular rate & rhythm and no murmurs and no lower extremity edema ABDOMEN:abdomen soft, non-tender and normal bowel sounds Musculoskeletal:no cyanosis of digits and no clubbing  NEURO: alert & oriented x 3 with fluent speech, no focal motor/sensory deficits  LABORATORY DATA:  I have reviewed the data as listed    Latest Ref Rng & Units 02/21/2022   11:30 AM 01/31/2022    2:05 PM 01/24/2022   12:18 PM  CBC  WBC 4.0 - 10.5 K/uL 9.9  9.2  10.5   Hemoglobin 12.0 - 15.0 g/dL 6.1  7.2  6.1   Hematocrit 36.0 - 46.0 % 21.2  24.3  21.0   Platelets 150 - 400 K/uL 259  284  258         Latest Ref Rng & Units 09/13/2021   11:40 AM 07/19/2021   12:46 PM 06/29/2021   12:19 PM  CMP  Glucose 70 - 99 mg/dL 114  96  94   BUN 6 - 20 mg/dL '7  10  16   '$ Creatinine 0.44 - 1.00 mg/dL 0.77  0.86  1.08   Sodium 135 - 145 mmol/L 142  141  139   Potassium 3.5 - 5.1 mmol/L 3.4  3.7  3.4   Chloride 98 - 111 mmol/L 109  107  104   CO2 22 - 32 mmol/L '27  26  26   '$ Calcium 8.9 - 10.3 mg/dL 9.3  9.0  9.7   Total Protein 6.5 - 8.1 g/dL 6.8  7.5  7.9   Total Bilirubin 0.3 - 1.2 mg/dL 0.3  0.2  0.3   Alkaline Phos 38 - 126 U/L 57  67  66   AST 15 - 41 U/L '12  12  13   '$ ALT 0 - 44 U/L '5  7  5       '$ RADIOGRAPHIC STUDIES: I have personally reviewed the radiological images as listed and agreed with the findings in the report. No results found.    No orders of the defined types were placed in this encounter.  All questions were answered. The patient knows to call the clinic with any problems, questions or concerns. No barriers to learning was detected. The total time spent in the appointment was {CHL ONC TIME VISIT - SKAJG:[id number]}.     Baldemar Friday, CMA 03/21/2022   I, Audry Riles, CMA, am acting as scribe for Truitt Merle, MD.   {Add scribe attestation statement}

## 2022-03-21 NOTE — Assessment & Plan Note (Deleted)
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 -she has required multiple blood transfusions and APCs since 2019. 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  -s/p IR embolization of gastric artery branch vessels on 12/10/17, cauterization of stomach for severe GI bleed in 11/2018, and epinephrine injection for bleeding ulcer on 11/03/20. -She has been treated with bevacizumab, frequent IV iron, and Amicar TID, though intermittently due to noncompliance.

## 2022-03-21 NOTE — Assessment & Plan Note (Deleted)
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

## 2022-03-22 ENCOUNTER — Inpatient Hospital Stay: Payer: Medicaid Other

## 2022-03-22 ENCOUNTER — Inpatient Hospital Stay: Payer: Medicaid Other | Admitting: Hematology

## 2022-03-22 ENCOUNTER — Telehealth: Payer: Self-pay | Admitting: Pain Medicine

## 2022-03-22 NOTE — Telephone Encounter (Signed)
Called patient to notify of upcoming appointment times. Left voicemail with appointment information.

## 2022-03-22 NOTE — Assessment & Plan Note (Deleted)
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

## 2022-03-22 NOTE — Assessment & Plan Note (Deleted)
-  She had multiple prolonged hospital stay due to severe GI bleeding.   -s/p embolization of gastric artery branch vessels on 12/10/17, cauterization of stomach for severe GI bleed in 11/2018, and epinephrine injection for bleeding ulcer on 11/03/20.  She has required frequent blood transfusion, IV iron and bevacizumab -She does respond to treatment, anemia improved with IV iron and bevacizumab, but she is not very compliant with her appointments.

## 2022-03-27 ENCOUNTER — Telehealth: Payer: Self-pay | Admitting: Hematology

## 2022-03-27 NOTE — Telephone Encounter (Signed)
Patient called to confirm upcoming appointments. Patient notified

## 2022-03-29 ENCOUNTER — Inpatient Hospital Stay: Payer: Medicaid Other

## 2022-03-29 ENCOUNTER — Other Ambulatory Visit: Payer: Self-pay

## 2022-03-29 ENCOUNTER — Encounter: Payer: Self-pay | Admitting: Hematology

## 2022-03-29 ENCOUNTER — Telehealth: Payer: Self-pay

## 2022-03-29 ENCOUNTER — Inpatient Hospital Stay (HOSPITAL_BASED_OUTPATIENT_CLINIC_OR_DEPARTMENT_OTHER): Payer: Medicaid Other | Admitting: Hematology

## 2022-03-29 VITALS — BP 172/77 | HR 66 | Temp 98.0°F | Resp 18 | Wt 229.2 lb

## 2022-03-29 DIAGNOSIS — Z79899 Other long term (current) drug therapy: Secondary | ICD-10-CM | POA: Diagnosis not present

## 2022-03-29 DIAGNOSIS — Z95828 Presence of other vascular implants and grafts: Secondary | ICD-10-CM

## 2022-03-29 DIAGNOSIS — Z7984 Long term (current) use of oral hypoglycemic drugs: Secondary | ICD-10-CM | POA: Diagnosis not present

## 2022-03-29 DIAGNOSIS — Z5112 Encounter for antineoplastic immunotherapy: Secondary | ICD-10-CM | POA: Diagnosis present

## 2022-03-29 DIAGNOSIS — I780 Hereditary hemorrhagic telangiectasia: Secondary | ICD-10-CM

## 2022-03-29 DIAGNOSIS — D649 Anemia, unspecified: Secondary | ICD-10-CM

## 2022-03-29 DIAGNOSIS — D500 Iron deficiency anemia secondary to blood loss (chronic): Secondary | ICD-10-CM

## 2022-03-29 DIAGNOSIS — F32A Depression, unspecified: Secondary | ICD-10-CM | POA: Diagnosis not present

## 2022-03-29 DIAGNOSIS — I10 Essential (primary) hypertension: Secondary | ICD-10-CM | POA: Diagnosis not present

## 2022-03-29 DIAGNOSIS — E119 Type 2 diabetes mellitus without complications: Secondary | ICD-10-CM | POA: Diagnosis not present

## 2022-03-29 LAB — CBC WITH DIFFERENTIAL (CANCER CENTER ONLY)
Abs Immature Granulocytes: 0.05 10*3/uL (ref 0.00–0.07)
Basophils Absolute: 0 10*3/uL (ref 0.0–0.1)
Basophils Relative: 0 %
Eosinophils Absolute: 0.2 10*3/uL (ref 0.0–0.5)
Eosinophils Relative: 1 %
HCT: 21.9 % — ABNORMAL LOW (ref 36.0–46.0)
Hemoglobin: 6.5 g/dL — CL (ref 12.0–15.0)
Immature Granulocytes: 0 %
Lymphocytes Relative: 11 %
Lymphs Abs: 1.3 10*3/uL (ref 0.7–4.0)
MCH: 23.9 pg — ABNORMAL LOW (ref 26.0–34.0)
MCHC: 29.7 g/dL — ABNORMAL LOW (ref 30.0–36.0)
MCV: 80.5 fL (ref 80.0–100.0)
Monocytes Absolute: 0.5 10*3/uL (ref 0.1–1.0)
Monocytes Relative: 5 %
Neutro Abs: 9.5 10*3/uL — ABNORMAL HIGH (ref 1.7–7.7)
Neutrophils Relative %: 83 %
Platelet Count: 280 10*3/uL (ref 150–400)
RBC: 2.72 MIL/uL — ABNORMAL LOW (ref 3.87–5.11)
RDW: 22.5 % — ABNORMAL HIGH (ref 11.5–15.5)
WBC Count: 11.5 10*3/uL — ABNORMAL HIGH (ref 4.0–10.5)
nRBC: 0 % (ref 0.0–0.2)

## 2022-03-29 LAB — PREPARE RBC (CROSSMATCH)

## 2022-03-29 LAB — TOTAL PROTEIN, URINE DIPSTICK: Protein, ur: 30 mg/dL — AB

## 2022-03-29 LAB — FERRITIN: Ferritin: 7 ng/mL — ABNORMAL LOW (ref 11–307)

## 2022-03-29 MED ORDER — SODIUM CHLORIDE 0.9% IV SOLUTION: 250.0000 mL | Freq: Once | INTRAVENOUS | Status: DC

## 2022-03-29 MED ORDER — SODIUM CHLORIDE 0.9% FLUSH
10.0000 mL | INTRAVENOUS | Status: DC | PRN
  Administered 2022-03-29: 10 mL

## 2022-03-29 MED ORDER — SODIUM CHLORIDE 0.9 % IV SOLN
5.0000 mg/kg | Freq: Once | INTRAVENOUS | Status: AC
  Administered 2022-03-29: 500 mg via INTRAVENOUS
  Filled 2022-03-29: qty 16

## 2022-03-29 MED ORDER — SODIUM CHLORIDE 0.9 % IV SOLN: 5.0000 mg/kg | Freq: Once | INTRAVENOUS | Status: DC

## 2022-03-29 MED ORDER — SODIUM CHLORIDE 0.9 % IV SOLN
250.0000 mg | Freq: Once | INTRAVENOUS | Status: AC
  Administered 2022-03-29: 250 mg via INTRAVENOUS
  Filled 2022-03-29: qty 20

## 2022-03-29 MED ORDER — SODIUM CHLORIDE 0.9% FLUSH
10.0000 mL | Freq: Once | INTRAVENOUS | Status: AC
  Administered 2022-03-29: 10 mL

## 2022-03-29 MED ORDER — SODIUM CHLORIDE 0.9 % IV SOLN: Freq: Once | INTRAVENOUS | Status: AC

## 2022-03-29 MED ORDER — HEPARIN SOD (PORK) LOCK FLUSH 100 UNIT/ML IV SOLN
500.0000 [IU] | Freq: Once | INTRAVENOUS | Status: AC | PRN
  Administered 2022-03-29: 500 [IU]

## 2022-03-29 NOTE — Progress Notes (Signed)
Per Dr Burr Medico, ok to run blood at 364m/h today

## 2022-03-29 NOTE — Progress Notes (Signed)
Per Dr Burr Medico, give both bevacizumab and ferrlecit today. Give beva regardless of urine protein results.

## 2022-03-29 NOTE — Progress Notes (Signed)
Ok to treat with hgb 6.5 per Dr Burr Medico

## 2022-03-29 NOTE — Progress Notes (Signed)
Per Dr. Burr Medico, "OK To run blood at rate of 358m/hr or 973ms"

## 2022-03-29 NOTE — Telephone Encounter (Signed)
CRITICAL VALUE STICKER  CRITICAL VALUE: HGB 6.5  RECEIVER (on-site recipient of call): [place] NOTIFIED: 12/15 1:26 pm  MESSENGER (representative from lab): Rosann Auerbach  MD NOTIFIED: Dr. Burr Medico

## 2022-03-29 NOTE — Patient Instructions (Addendum)
[facility]  Discharge Instructions: Thank you for choosing [facility] to provide your oncology and hematology care.   If you have a lab appointment with the [facility], please go directly to the [facility] and check in at the registration area.   Wear comfortable clothing and clothing appropriate for easy access to any Portacath or PICC line.   We strive to give you quality time with your provider. You may need to reschedule your appointment if you arrive late (15 or more minutes).  Arriving late affects you and other patients whose appointments are after yours.  Also, if you miss three or more appointments without notifying the office, you may be dismissed from the clinic at the provider's discretion.      For prescription refill requests, have your pharmacy contact our office and allow 72 hours for refills to be completed.    Today you received the following chemotherapy and/or immunotherapy agents: bevacizumab   To help prevent nausea and vomiting after your treatment, we encourage you to take your nausea medication as directed.  BELOW ARE SYMPTOMS THAT SHOULD BE REPORTED IMMEDIATELY: *FEVER GREATER THAN 100.4 F (38 C) OR HIGHER *CHILLS OR SWEATING *NAUSEA AND VOMITING THAT IS NOT CONTROLLED WITH YOUR NAUSEA MEDICATION *UNUSUAL SHORTNESS OF BREATH *UNUSUAL BRUISING OR BLEEDING *URINARY PROBLEMS (pain or burning when urinating, or frequent urination) *BOWEL PROBLEMS (unusual diarrhea, constipation, pain near the anus) TENDERNESS IN MOUTH AND THROAT WITH OR WITHOUT PRESENCE OF ULCERS (sore throat, sores in mouth, or a toothache) UNUSUAL RASH, SWELLING OR PAIN  UNUSUAL VAGINAL DISCHARGE OR ITCHING   Items with * indicate a potential emergency and should be followed up as soon as possible or go to the Emergency Department if any problems should occur.  Please show the CHEMOTHERAPY ALERT CARD or IMMUNOTHERAPY ALERT CARD at check-in to  the Emergency Department and triage nurse.  Should you have questions after your visit or need to cancel or reschedule your appointment, please contact [facility]  Dept: [phone]  and follow the prompts.  Office hours are 8:00 a.m. to 4:30 p.m. Monday - Friday. Please note that voicemails left after 4:00 p.m. may not be returned until the following business day.  We are closed weekends and major holidays. You have access to a nurse at all times for urgent questions. Please call the main number to the clinic Dept: [phone] and follow the prompts.   For any non-urgent questions, you may also contact your provider using MyChart. We now offer e-Visits for anyone 7 and older to request care online for non-urgent symptoms. For details visit [email or web address].   Also download the MyChart app! Go to the app store, search "MyChart", open the app, select [facility], and log in with your MyChart username and password.  Masks are optional in the cancer centers. If you would like for your care team to wear a mask while they are taking care of you, please let them know. You may have one support person who is at least 61 years old accompany you for your appointments.  Sodium Ferric Gluconate Complex Injection What is this medication? SODIUM FERRIC GLUCONATE COMPLEX (SOE dee um FER ik GLOO koe nate KOM pleks) treats low levels of iron (iron deficiency anemia) in people with kidney disease. Iron is a mineral that plays an important role in making red blood cells, which carry oxygen from your lungs to the rest of your body. This medicine  may be used for other purposes; ask your health care provider or pharmacist if you have questions. COMMON BRAND NAME(S): Ferrlecit, Nulecit What should I tell my care team before I take this medication? They need to know if you have any of the following conditions: Anemia that is not from iron deficiency High levels of iron in the  blood An unusual or allergic reaction to iron, other medications, foods, dyes, or preservatives Pregnant or are trying to become pregnant Breast-feeding How should I use this medication? This medication is injected into a vein. It is given by your care team in a hospital or clinic setting. Talk to your care team about the use of this medication in children. While it may be prescribed for children as young as 6 years for selected conditions, precautions do apply. Overdosage: If you think you have taken too much of this medicine contact a poison control center or emergency room at once. NOTE: This medicine is only for you. Do not share this medicine with others. What if I miss a dose? It is important not to miss your dose. Call your care team if you are unable to keep an appointment. What may interact with this medication? Do not take this medication with any of the following: Deferasirox Deferoxamine Dimercaprol This medication may also interact with the following: Other iron products This list may not describe all possible interactions. Give your health care provider a list of all the medicines, herbs, non-prescription drugs, or dietary supplements you use. Also tell them if you smoke, drink alcohol, or use illegal drugs. Some items may interact with your medicine. What should I watch for while using this medication? Your condition will be monitored carefully while you are receiving this medication. Visit your care team for regular checks on your progress. You may need blood work while you are taking this medication. What side effects may I notice from receiving this medication? Side effects that you should report to your care team as soon as possible: Allergic reactions--skin rash, itching, hives, swelling of the face, lips, tongue, or throat Low blood pressure--dizziness, feeling faint or lightheaded, blurry vision Shortness of breath Side effects that usually do not require medical  attention (report to your care team if they continue or are bothersome): Flushing Headache Joint pain Muscle pain Nausea Pain, redness, or irritation at injection site This list may not describe all possible side effects. Call your doctor for medical advice about side effects. You may report side effects to FDA at 1-800-FDA-1088. Where should I keep my medication? This medication is given in a hospital or clinic and will not be stored at home. NOTE: This sheet is a summary. It may not cover all possible information. If you have questions about this medicine, talk to your doctor, pharmacist, or health care provider.  2023 Elsevier/Gold Standard (2007-05-23 00:00:00) Blood Transfusion, Adult, Care After After a blood transfusion, it is common to have: Bruising and soreness at the IV site. A headache. Follow these instructions at home: Your doctor may give you more instructions. If you have problems, contact your doctor. Insertion site care     Follow instructions from your doctor about how to take care of your insertion site. This is where an IV tube was put into your vein. Make sure you: Wash your hands with soap and water for at least 20 seconds before and after you change your bandage. If you cannot use soap and water, use hand sanitizer. Change your bandage as told by your  doctor. Check your insertion site every day for signs of infection. Check for: Redness, swelling, or pain. Bleeding from the site. Warmth. Pus or a bad smell. General instructions Take over-the-counter and prescription medicines only as told by your doctor. Rest as told by your doctor. Go back to your normal activities as told by your doctor. Keep all follow-up visits. You may need to have tests at certain times to check your blood. Contact a doctor if: You have itching or red, swollen areas of skin (hives). You have a fever or chills. You have pain in the head, back, or chest. You feel worried or nervous  (anxious). You feel weak after doing your normal activities. You have any of these problems at the insertion site: Redness, swelling, warmth, or pain. Bleeding that does not stop with pressure. Pus or a bad smell. If you received your blood transfusion in an outpatient setting, you will be told whom to contact to report any reactions. Get help right away if: You have signs of a serious reaction. This may be coming from an allergy or the body's defense system (immune system). Signs include: Trouble breathing or shortness of breath. Swelling of the face or feeling warm (flushed). A widespread rash. Dark pee (urine) or blood in the pee. Fast heartbeat. These symptoms may be an emergency. Get help right away. Call 911. Do not wait to see if the symptoms will go away. Do not drive yourself to the hospital. Summary Bruising and soreness at the IV site are common. Check your insertion site every day for signs of infection. Rest as told by your doctor. Go back to your normal activities as told by your doctor. Get help right away if you have signs of a serious reaction. This information is not intended to replace advice given to you by your health care provider. Make sure you discuss any questions you have with your health care provider. Document Revised: 06/29/2021 Document Reviewed: 06/29/2021 Elsevier Patient Education  [place].

## 2022-03-29 NOTE — Progress Notes (Signed)
[place]   [phone] [phone]) [phone]   Clinic Follow up Note   Patient Care Team: Drucie Opitz, MD as PCP - General  Date of Service:  03/29/2022  CHIEF COMPLAINT: f/u of HHT and anemia   CURRENT THERAPY:  -Blood transfusion for Hg<8.0 (2units on different days if Hg<7) -iv ferric gluconate, with ferritin goal 100-200.  -Avastin/Zirabev restarted on 04/03/18. Currently given PRN due to proteinuria. -Amicar 1g BID, intermittently since 07/17/18  ASSESSMENT:  Peggy House is a 62 y.o. female with   ASSESSMENT & PLAN:  Peggy House is a 61 y.o. 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 -she has required multiple blood transfusions and APCs since 2019. 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  -s/p IR embolization of gastric artery branch vessels on 12/10/17, cauterization of stomach for severe GI bleed in 11/2018, and epinephrine injection for bleeding ulcer on 11/03/20. -She has been non-complaint with her appointments, had no show/late show for last 3 weeks. We previously connected her with transportation service    2. 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 Social Support, Depression -she lost her husband in 11/2019 in a traumatic accident that was not resolved legally. She continues to experience grief from this tragedy. -she and one of her sons are living together now. He provides transportation to her appointments. -she continues on Zoloft and Xanax per PCP    PLAN: -lab reviewed blood counts low, will proceed with iv  ferric gluconate, beva and one unit blood  -weekly lab and iv iron if previous ferritin<200 -beva every 2 weeks  -f/u in 8 weeks    SUMMARY OF ONCOLOGIC HISTORY: Oncology History   No history exists.     INTERVAL HISTORY:  Peggy House is here for a follow up of HHT and anemia  She was last seen by me on 01/24/2022 She presents to the clinic alone. Pt has transportation issues and had no show or very late show for last 3 weeks and has not received treatment. She reports some nose bleeding and intermittent GI bleeding in past week, moderate fatigue, no chest pain.     All other systems were reviewed with the patient and are negative.  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    SURGICAL HISTORY: Past Surgical History:  Procedure Laterality Date   ABDOMINAL HYSTERECTOMY     CARPAL TUNNEL RELEASE  05/13/2011   Procedure: CARPAL TUNNEL RELEASE;  Surgeon: Nita Sells, MD;  Location: [place];  Service: Orthopedics;  Laterality: Left;   COLONOSCOPY N/A 03/02/2020   Procedure: COLONOSCOPY;  Surgeon: Ronald Lobo, MD;  Location: [facility];  Service: Endoscopy;  Laterality: N/A;  COLONOSCOPY WITH PROPOFOL N/A 04/28/2014   Procedure: COLONOSCOPY WITH PROPOFOL;  Surgeon: Cleotis Nipper, MD;  Location: [place];  Service: Endoscopy;  Laterality: N/A;   DG TOES*L*  2/10   rt   DILATION AND CURETTAGE OF UTERUS     ENTEROSCOPY N/A 10/17/2017   Procedure: ENTEROSCOPY;  Surgeon: Otis Brace, MD;  Location: [place];  Service: Gastroenterology;  Laterality: N/A;    ESOPHAGOGASTRODUODENOSCOPY N/A 04/10/2014   Procedure: ESOPHAGOGASTRODUODENOSCOPY (EGD);  Surgeon: [name], MD;  Location: [facility] ENDOSCOPY;  Service: Endoscopy;  Laterality: N/A;   ESOPHAGOGASTRODUODENOSCOPY N/A 05/10/2017   Procedure: ESOPHAGOGASTRODUODENOSCOPY (EGD);  Surgeon: Ronald Lobo, MD;  Location: [facility] ENDOSCOPY;  Service: Endoscopy;  Laterality: N/A;   ESOPHAGOGASTRODUODENOSCOPY N/A 09/22/2017   Procedure: ESOPHAGOGASTRODUODENOSCOPY (EGD);  Surgeon: Clarene Essex, MD;  Location: [place];  Service: Endoscopy;  Laterality: N/A;  bedside   ESOPHAGOGASTRODUODENOSCOPY N/A 03/02/2020   Procedure: ESOPHAGOGASTRODUODENOSCOPY (EGD);  Surgeon: Ronald Lobo, MD;  Location: [facility];  Service: Endoscopy;  Laterality: N/A;   ESOPHAGOGASTRODUODENOSCOPY N/A 11/03/2020   Procedure: ESOPHAGOGASTRODUODENOSCOPY (EGD);  Surgeon: Wilford Corner, MD;  Location: [place];  Service: Endoscopy;  Laterality: N/A;   ESOPHAGOGASTRODUODENOSCOPY (EGD) WITH PROPOFOL N/A 04/27/2014   Procedure: ESOPHAGOGASTRODUODENOSCOPY (EGD) WITH PROPOFOL;  Surgeon: Cleotis Nipper, MD;  Location: [place];  Service: Endoscopy;  Laterality: N/A;  possible apc   ESOPHAGOGASTRODUODENOSCOPY (EGD) WITH PROPOFOL N/A 09/30/2017   Procedure: ESOPHAGOGASTRODUODENOSCOPY (EGD) WITH PROPOFOL;  Surgeon: [name], MD;  Location: [place];  Service: Gastroenterology;  Laterality: N/A;   ESOPHAGOGASTRODUODENOSCOPY (EGD) WITH PROPOFOL N/A 10/01/2017   Procedure: ESOPHAGOGASTRODUODENOSCOPY (EGD) WITH PROPOFOL;  Surgeon: [name], MD;  Location: [place];  Service: Gastroenterology;  Laterality: N/A;   ESOPHAGOGASTRODUODENOSCOPY (EGD) WITH PROPOFOL N/A 10/08/2017   Procedure: ESOPHAGOGASTRODUODENOSCOPY (EGD) WITH PROPOFOL;  Surgeon: Otis Brace, MD;  Location: [place];  Service: Gastroenterology;  Laterality: N/A;   ESOPHAGOGASTRODUODENOSCOPY (EGD) WITH PROPOFOL N/A 10/17/2017   Procedure:  ESOPHAGOGASTRODUODENOSCOPY (EGD) WITH PROPOFOL;  Surgeon: Otis Brace, MD;  Location: [place];  Service: Gastroenterology;  Laterality: N/A;   ESOPHAGOGASTRODUODENOSCOPY (EGD) WITH PROPOFOL N/A 10/19/2017   Procedure: ESOPHAGOGASTRODUODENOSCOPY (EGD) WITH PROPOFOL;  Surgeon: Otis Brace, MD;  Location: [place];  Service: Gastroenterology;  Laterality: N/A;   ESOPHAGOGASTRODUODENOSCOPY (EGD) WITH PROPOFOL N/A 12/04/2018   Procedure: ESOPHAGOGASTRODUODENOSCOPY (EGD) WITH PROPOFOL;  Surgeon: Wilford Corner, MD;  Location: [facility];  Service: Endoscopy;  Laterality: N/A;   GIVENS CAPSULE STUDY N/A 10/02/2017   Procedure: GIVENS CAPSULE STUDY;  Surgeon: [name], MD;  Location: [place];  Service: Gastroenterology;  Laterality: N/A;   GIVENS CAPSULE STUDY N/A 10/08/2017   Procedure: GIVENS CAPSULE STUDY;  Surgeon: Otis Brace, MD;  Location: [place];  Service: Gastroenterology;  Laterality: N/A;  endoscopic placement of capsule   GIVENS CAPSULE STUDY N/A 03/02/2020   Procedure: GIVENS CAPSULE STUDY;  Surgeon: Ronald Lobo, MD;  Location: [facility];  Service: Endoscopy;  Laterality: N/A;   HEMOSTASIS CLIP PLACEMENT  12/04/2018   Procedure: HEMOSTASIS CLIP PLACEMENT;  Surgeon: Wilford Corner, MD;  Location: [facility];  Service: Endoscopy;;   HOT HEMOSTASIS N/A 04/27/2014   Procedure: HOT HEMOSTASIS (ARGON PLASMA COAGULATION/BICAP);  Surgeon: Cleotis Nipper, MD;  Location: [facility] ENDOSCOPY;  Service: Endoscopy;  Laterality: N/A;   HOT HEMOSTASIS N/A 09/30/2017   Procedure: HOT HEMOSTASIS (ARGON PLASMA COAGULATION/BICAP);  Surgeon: [name], MD;  Location: [place];  Service: Gastroenterology;  Laterality: N/A;   HOT HEMOSTASIS N/A 10/01/2017   Procedure: HOT HEMOSTASIS (ARGON PLASMA COAGULATION/BICAP);  Surgeon: [name], MD;  Location: [place];  Service: Gastroenterology;  Laterality: N/A;   HOT HEMOSTASIS N/A 10/17/2017   Procedure: HOT  HEMOSTASIS (ARGON PLASMA COAGULATION/BICAP);  Surgeon: Otis Brace, MD;  Location: [facility] ENDOSCOPY;  Service: Gastroenterology;  Laterality: N/A;   HOT HEMOSTASIS N/A 10/19/2017   Procedure: HOT HEMOSTASIS (ARGON PLASMA COAGULATION/BICAP);  Surgeon: Otis Brace, MD;  Location: [facility] ENDOSCOPY;  Service: Gastroenterology;  Laterality: N/A;   HOT HEMOSTASIS N/A 03/02/2020   Procedure: HOT HEMOSTASIS (ARGON PLASMA COAGULATION/BICAP);  Surgeon: Ronald Lobo, MD;  Location: [facility];  Service: Endoscopy;  Laterality: N/A;   IR IMAGING GUIDED PORT INSERTION  07/08/2018   L shoulder Surgery  2011   POLYPECTOMY  03/02/2020   Procedure: POLYPECTOMY;  Surgeon: Ronald Lobo, MD;  Location: [facility];  Service: Endoscopy;;   SCLEROTHERAPY  11/03/2020   Procedure: Clide Deutscher;  Surgeon: Wilford Corner, MD;  Location: [facility] ENDOSCOPY;  Service: Endoscopy;;   SUBMUCOSAL INJECTION  09/22/2017   Procedure: SUBMUCOSAL INJECTION;  Surgeon: Clarene Essex, MD;  Location: [place];  Service: Endoscopy;;   SUBMUCOSAL INJECTION  12/04/2018   Procedure: SUBMUCOSAL INJECTION;  Surgeon: Wilford Corner, MD;  Location: [facility];  Service: Endoscopy;;    I have reviewed the social history and family history with the patient and they are unchanged from previous note.  ALLERGIES:  is allergic to feraheme [ferumoxytol], nsaids, tomato, iron (ferrous sulfate) [ferrous sulfate er], other, and wasp venom.  MEDICATIONS:  Current Outpatient Medications  Medication Sig Dispense Refill   Accu-Chek Softclix Lancets lancets Use to check blood sugar before breakfast and before dinner while on steroids (Patient taking differently: 1 each by Other route See admin instructions. Use to check blood sugar before breakfast and before dinner while on steroids) 100 each 1   acitretin (SORIATANE) 10 MG capsule Take 10 mg by mouth daily.     ALPRAZolam (XANAX) 0.5 MG tablet Take 1 tablet (0.5 mg total) by mouth at  bedtime as needed for anxiety or sleep. 30 tablet 0   Aminocaproic Acid 1000 MG TABS TAKE 1 TABLET(1000 MG) BY MOUTH IN THE MORNING AND AT BEDTIME 60 tablet 2   Aminocaproic Acid 1000 MG TABS TAKE 1 TABLET(1000 MG) BY MOUTH IN THE MORNING AND AT BEDTIME 60 tablet 2   amLODipine (NORVASC) 10 MG tablet Take 1 tablet (10 mg total) by mouth daily. 90 tablet 3   atorvastatin (LIPITOR) 80 MG tablet TAKE 1 TABLET(80 MG) BY MOUTH DAILY 30 tablet 3   diphenoxylate-atropine (LOMOTIL) 2.5-0.025 MG tablet 1 to 2 PO QID prn diarrhea 30 tablet 1   furosemide (LASIX) 40 MG tablet Take 40 mg by mouth daily.     glucose blood (ACCU-CHEK GUIDE) test strip Check blood sugar 2 times per day while on steroids before breakfast and dinner (Patient taking differently: 1 each by Other route See admin instructions. Check blood sugar 2 times per day while on steroids before breakfast and dinner) 50 each 3   hydrochlorothiazide (HYDRODIURIL) 12.5 MG tablet TAKE 1 TABLET(12.5 MG) BY MOUTH DAILY 90 tablet 1   lidocaine-prilocaine (EMLA) cream Apply 1 application topically as needed. 30 g 0   metFORMIN (GLUCOPHAGE) 500 MG tablet TAKE 1 TABLET(500 MG) BY MOUTH TWICE DAILY WITH A MEAL 180 tablet 3   methocarbamol (ROBAXIN) 500 MG tablet Take 1 tablet (500 mg total) by mouth every 8 (eight) hours as needed for muscle spasms (back pain/spasm). 7 tablet 0   metoprolol succinate (TOPROL-XL) 100 MG 24 hr tablet  Take 100 mg by mouth daily.     pantoprazole (PROTONIX) 40 MG tablet TAKE 1 TABLET(40 MG) BY MOUTH TWICE DAILY 60 tablet 5   Podiatric Products (FLEXITOL HEEL BALM) OINT Apply 1 application topically as needed (dry skin).      potassium chloride (KLOR-CON) 20 MEQ packet Take 20 mEq by mouth daily. 30 packet 1   PROAIR HFA 108 (90 Base) MCG/ACT inhaler INHALE 1 TO 2 PUFFS INTO THE LUNGS EVERY 6 HOURS AS NEEDED FOR WHEEZING OR SHORTNESS OF BREATH 8.5 g 0   prochlorperazine (COMPAZINE) 10 MG tablet Take 1 tablet (10 mg total) by  mouth every 6 (six) hours as needed for nausea or vomiting. 30 tablet 1   sertraline (ZOLOFT) 100 MG tablet Take 1 tablet (100 mg total) by mouth daily. 30 tablet 2   sertraline (ZOLOFT) 25 MG tablet Take 5 tablets (125 mg total) by mouth daily. 150 tablet 2   terbinafine (LAMISIL AT) 1 % cream Apply 1 application topically 2 (two) times daily. Both bottom and top of both feet and toes 36 g 0   traZODone (DESYREL) 100 MG tablet TAKE 1 TABLET(100 MG) BY MOUTH AT BEDTIME 30 tablet 2   No current facility-administered medications for this visit.   Facility-Administered Medications Ordered in Other Visits  Medication Dose Route Frequency Provider Last Rate Last Admin   diphenhydrAMINE (BENADRYL) 25 mg capsule            diphenhydrAMINE (BENADRYL) 25 mg capsule            phenylephrine (NEO-SYNEPHRINE) 1 % nasal drops 2 drop  2 drop Left Nare Once Truitt Merle, MD        PHYSICAL EXAMINATION: ECOG PERFORMANCE STATUS: 2 - Symptomatic, <50% confined to bed  There were no vitals filed for this visit. Wt Readings from Last 3 Encounters:  03/29/22 229 lb 4 oz (104 kg)  02/22/22 231 lb 11.2 oz (105.1 kg)  02/21/22 231 lb 0.4 oz (104.8 kg)     GENERAL:alert, no distress and comfortable SKIN: skin color normal, no rashes or significant lesions EYES: normal, Conjunctiva are pink and non-injected, sclera clear   LABORATORY DATA:  I have reviewed the data as listed    Latest Ref Rng & Units 03/29/2022    1:02 PM 02/21/2022   11:30 AM 01/31/2022    2:05 PM  CBC  WBC 4.0 - 10.5 K/uL 11.5  9.9  9.2   Hemoglobin 12.0 - 15.0 g/dL 6.5  6.1  7.2   Hematocrit 36.0 - 46.0 % 21.9  21.2  24.3   Platelets 150 - 400 K/uL 280  259  284         Latest Ref Rng & Units 09/13/2021   11:40 AM 07/19/2021   12:46 PM 06/29/2021   12:19 PM  CMP  Glucose 70 - 99 mg/dL 114  96  94   BUN 6 - 20 mg/dL '7  10  16   '$ Creatinine 0.44 - 1.00 mg/dL 0.77  0.86  1.08   Sodium 135 - 145 mmol/L 142  141  139   Potassium 3.5  - 5.1 mmol/L 3.4  3.7  3.4   Chloride 98 - 111 mmol/L 109  107  104   CO2 22 - 32 mmol/L '27  26  26   '$ Calcium 8.9 - 10.3 mg/dL 9.3  9.0  9.7   Total Protein 6.5 - 8.1 g/dL 6.8  7.5  7.9   Total Bilirubin 0.3 -  1.2 mg/dL 0.3  0.2  0.3   Alkaline Phos 38 - 126 U/L 57  67  66   AST 15 - 41 U/L '12  12  13   '$ ALT 0 - 44 U/L '5  7  5       '$ RADIOGRAPHIC STUDIES: I have personally reviewed the radiological images as listed and agreed with the findings in the report. No results found.    Orders Placed This Encounter  Procedures   Informed Consent Details: Physician/Practitioner Attestation; Transcribe to consent form and obtain patient signature    Order Specific Question:   Physician/Practitioner attestation of informed consent for blood and or blood product transfusion    Answer:   I, the physician/practitioner, attest that I have discussed with the patient the benefits, risks, side effects, alternatives, likelihood of achieving goals and potential problems during recovery for the procedure that I have provided informed consent.    Order Specific Question:   Product(s)    Answer:   All Product(s)   All questions were answered. The patient knows to call the clinic with any problems, questions or concerns. No barriers to learning was detected. The total time spent in the appointment was 30 minutes.     Truitt Merle, MD 03/29/2022   Felicity Coyer, CMA, am acting as scribe for Truitt Merle, MD.   I have reviewed the above documentation for accuracy and completeness, and I agree with the above.

## 2022-03-29 NOTE — Progress Notes (Signed)
The following biosimilar Vegzelma (bevacizumab-adcd)  has been selected for use in this patient.  Kennith Center, Pharm.D., CPP 03/29/2022'@2'$ :23 PM

## 2022-03-30 LAB — BPAM RBC
Blood Product Expiration Date: 202401052359
ISSUE DATE / TIME: 202312151633
Unit Type and Rh: 600

## 2022-03-30 LAB — TYPE AND SCREEN
ABO/RH(D): A NEG
Antibody Screen: NEGATIVE
Unit division: 0

## 2022-04-02 ENCOUNTER — Telehealth: Payer: Self-pay | Admitting: Hematology

## 2022-04-02 ENCOUNTER — Telehealth: Payer: Self-pay

## 2022-04-02 ENCOUNTER — Other Ambulatory Visit: Payer: Self-pay

## 2022-04-02 MED ORDER — ONDANSETRON HCL 4 MG PO TABS: 4.0000 mg | ORAL_TABLET | Freq: Three times a day (TID) | ORAL | 0 refills | Status: DC | PRN

## 2022-04-02 NOTE — Telephone Encounter (Signed)
Spoke with pt via telephone regarding Secure Chat message from [facility].  Pt stated she's feeling nauseous and has diarrhea.  Pt stated she extremely fatigue but denied fever.  Pt stated her stools are black in color.  Instructed pt to go to the ED for further evaluation.  Pt verbalized understanding of instructions.  Notified Dr. Burr Medico of referral to ED.

## 2022-04-02 NOTE — Telephone Encounter (Signed)
Spoke with patient to confirm upcoming appointments

## 2022-04-04 ENCOUNTER — Inpatient Hospital Stay: Payer: Medicaid Other

## 2022-04-11 ENCOUNTER — Emergency Department (HOSPITAL_COMMUNITY): Payer: Medicaid Other

## 2022-04-11 ENCOUNTER — Telehealth: Payer: Self-pay

## 2022-04-11 ENCOUNTER — Inpatient Hospital Stay: Payer: Medicaid Other

## 2022-04-11 ENCOUNTER — Other Ambulatory Visit: Payer: Self-pay

## 2022-04-11 ENCOUNTER — Inpatient Hospital Stay (HOSPITAL_COMMUNITY)
Admission: EM | Admit: 2022-04-11 | Discharge: 2022-04-14 | DRG: 378 | Disposition: A | Discharge status: Home / Self Care | Payer: Medicaid Other | Attending: Internal Medicine | Admitting: Internal Medicine

## 2022-04-11 DIAGNOSIS — Z91030 Bee allergy status: Secondary | ICD-10-CM

## 2022-04-11 DIAGNOSIS — D62 Acute posthemorrhagic anemia: Secondary | ICD-10-CM | POA: Diagnosis present

## 2022-04-11 DIAGNOSIS — K8020 Calculus of gallbladder without cholecystitis without obstruction: Secondary | ICD-10-CM | POA: Diagnosis not present

## 2022-04-11 DIAGNOSIS — K920 Hematemesis: Secondary | ICD-10-CM | POA: Diagnosis present

## 2022-04-11 DIAGNOSIS — I10 Essential (primary) hypertension: Secondary | ICD-10-CM | POA: Diagnosis present

## 2022-04-11 DIAGNOSIS — K922 Gastrointestinal hemorrhage, unspecified: Secondary | ICD-10-CM | POA: Diagnosis not present

## 2022-04-11 DIAGNOSIS — Z79899 Other long term (current) drug therapy: Secondary | ICD-10-CM

## 2022-04-11 DIAGNOSIS — Z841 Family history of disorders of kidney and ureter: Secondary | ICD-10-CM

## 2022-04-11 DIAGNOSIS — E1151 Type 2 diabetes mellitus with diabetic peripheral angiopathy without gangrene: Secondary | ICD-10-CM | POA: Diagnosis present

## 2022-04-11 DIAGNOSIS — Z6837 Body mass index (BMI) 37.0-37.9, adult: Secondary | ICD-10-CM

## 2022-04-11 DIAGNOSIS — K449 Diaphragmatic hernia without obstruction or gangrene: Secondary | ICD-10-CM | POA: Diagnosis present

## 2022-04-11 DIAGNOSIS — F419 Anxiety disorder, unspecified: Secondary | ICD-10-CM | POA: Diagnosis present

## 2022-04-11 DIAGNOSIS — I272 Pulmonary hypertension, unspecified: Secondary | ICD-10-CM | POA: Diagnosis present

## 2022-04-11 DIAGNOSIS — K219 Gastro-esophageal reflux disease without esophagitis: Secondary | ICD-10-CM | POA: Diagnosis present

## 2022-04-11 DIAGNOSIS — Z452 Encounter for adjustment and management of vascular access device: Secondary | ICD-10-CM | POA: Diagnosis not present

## 2022-04-11 DIAGNOSIS — K429 Umbilical hernia without obstruction or gangrene: Secondary | ICD-10-CM | POA: Diagnosis not present

## 2022-04-11 DIAGNOSIS — E1159 Type 2 diabetes mellitus with other circulatory complications: Secondary | ICD-10-CM | POA: Diagnosis present

## 2022-04-11 DIAGNOSIS — K5521 Angiodysplasia of colon with hemorrhage: Principal | ICD-10-CM | POA: Diagnosis present

## 2022-04-11 DIAGNOSIS — Z91018 Allergy to other foods: Secondary | ICD-10-CM

## 2022-04-11 DIAGNOSIS — F418 Other specified anxiety disorders: Secondary | ICD-10-CM

## 2022-04-11 DIAGNOSIS — Z7984 Long term (current) use of oral hypoglycemic drugs: Secondary | ICD-10-CM

## 2022-04-11 DIAGNOSIS — E785 Hyperlipidemia, unspecified: Secondary | ICD-10-CM | POA: Diagnosis present

## 2022-04-11 DIAGNOSIS — Z888 Allergy status to other drugs, medicaments and biological substances status: Secondary | ICD-10-CM

## 2022-04-11 DIAGNOSIS — F411 Generalized anxiety disorder: Secondary | ICD-10-CM | POA: Diagnosis present

## 2022-04-11 DIAGNOSIS — Z8711 Personal history of peptic ulcer disease: Secondary | ICD-10-CM

## 2022-04-11 DIAGNOSIS — F32A Depression, unspecified: Secondary | ICD-10-CM | POA: Diagnosis present

## 2022-04-11 DIAGNOSIS — D649 Anemia, unspecified: Secondary | ICD-10-CM

## 2022-04-11 DIAGNOSIS — R1032 Left lower quadrant pain: Secondary | ICD-10-CM | POA: Diagnosis not present

## 2022-04-11 DIAGNOSIS — I780 Hereditary hemorrhagic telangiectasia: Secondary | ICD-10-CM | POA: Diagnosis present

## 2022-04-11 DIAGNOSIS — E876 Hypokalemia: Secondary | ICD-10-CM | POA: Diagnosis not present

## 2022-04-11 DIAGNOSIS — E119 Type 2 diabetes mellitus without complications: Secondary | ICD-10-CM | POA: Diagnosis present

## 2022-04-11 DIAGNOSIS — R109 Unspecified abdominal pain: Principal | ICD-10-CM

## 2022-04-11 DIAGNOSIS — Z833 Family history of diabetes mellitus: Secondary | ICD-10-CM

## 2022-04-11 DIAGNOSIS — Z886 Allergy status to analgesic agent status: Secondary | ICD-10-CM

## 2022-04-11 DIAGNOSIS — F17210 Nicotine dependence, cigarettes, uncomplicated: Secondary | ICD-10-CM | POA: Diagnosis present

## 2022-04-11 DIAGNOSIS — Z832 Family history of diseases of the blood and blood-forming organs and certain disorders involving the immune mechanism: Secondary | ICD-10-CM

## 2022-04-11 LAB — COMPREHENSIVE METABOLIC PANEL
ALT: 6 U/L (ref 0–44)
AST: 13 U/L — ABNORMAL LOW (ref 15–41)
Albumin: 3 g/dL — ABNORMAL LOW (ref 3.5–5.0)
Alkaline Phosphatase: 52 U/L (ref 38–126)
Anion gap: 6 (ref 5–15)
BUN: 13 mg/dL (ref 8–23)
CO2: 22 mmol/L (ref 22–32)
Calcium: 9.2 mg/dL (ref 8.9–10.3)
Chloride: 111 mmol/L (ref 98–111)
Creatinine, Ser: 1.03 mg/dL — ABNORMAL HIGH (ref 0.44–1.00)
GFR, Estimated: >60 mL/min (ref >60)
Glucose, Bld: 84 mg/dL (ref 70–99)
Potassium: 3.4 mmol/L — ABNORMAL LOW (ref 3.5–5.1)
Sodium: 139 mmol/L (ref 135–145)
Total Bilirubin: 0.1 mg/dL — ABNORMAL LOW (ref 0.3–1.2)
Total Protein: 6.7 g/dL (ref 6.5–8.1)

## 2022-04-11 LAB — CBC
HCT: 16.5 % — ABNORMAL LOW (ref 36.0–46.0)
Hemoglobin: 4.6 g/dL — CL (ref 12.0–15.0)
MCH: 23.5 pg — ABNORMAL LOW (ref 26.0–34.0)
MCHC: 27.9 g/dL — ABNORMAL LOW (ref 30.0–36.0)
MCV: 84.2 fL (ref 80.0–100.0)
Platelets: 397 10*3/uL (ref 150–400)
RBC: 1.96 MIL/uL — ABNORMAL LOW (ref 3.87–5.11)
RDW: 22.6 % — ABNORMAL HIGH (ref 11.5–15.5)
WBC: 11.4 10*3/uL — ABNORMAL HIGH (ref 4.0–10.5)
nRBC: 0.2 % (ref 0.0–0.2)

## 2022-04-11 LAB — APTT: aPTT: 30 seconds (ref 24–36)

## 2022-04-11 LAB — PREPARE RBC (CROSSMATCH)

## 2022-04-11 LAB — PROTIME-INR
INR: 1.2 (ref 0.8–1.2)
Prothrombin Time: 14.9 seconds (ref 11.4–15.2)

## 2022-04-11 LAB — LIPASE, BLOOD: Lipase: 29 U/L (ref 11–51)

## 2022-04-11 LAB — POC OCCULT BLOOD, ED: Fecal Occult Bld: POSITIVE — AB

## 2022-04-11 MED ORDER — SODIUM CHLORIDE 0.9% IV SOLUTION: Freq: Once | INTRAVENOUS | Status: AC

## 2022-04-11 MED ORDER — IOHEXOL 350 MG/ML SOLN
100.0000 mL | Freq: Once | INTRAVENOUS | Status: AC | PRN
  Administered 2022-04-11: 100 mL via INTRAVENOUS

## 2022-04-11 MED ORDER — MORPHINE SULFATE (PF) 4 MG/ML IV SOLN: 4.0000 mg | Freq: Once | INTRAVENOUS | Status: DC | PRN

## 2022-04-11 MED ORDER — PANTOPRAZOLE INFUSION (NEW) - SIMPLE MED
8.0000 mg/h | INTRAVENOUS | Status: AC
  Administered 2022-04-11 – 2022-04-13 (×4): 8 mg/h via INTRAVENOUS
  Filled 2022-04-11: qty 100
  Filled 2022-04-11: qty 80
  Filled 2022-04-11: qty 100
  Filled 2022-04-11 (×2): qty 80

## 2022-04-11 MED ORDER — PANTOPRAZOLE SODIUM 40 MG IV SOLR
40.0000 mg | Freq: Once | INTRAVENOUS | Status: AC
  Administered 2022-04-11: 40 mg via INTRAVENOUS
  Filled 2022-04-11: qty 10

## 2022-04-11 MED ORDER — ONDANSETRON HCL 4 MG/2ML IJ SOLN
4.0000 mg | Freq: Once | INTRAMUSCULAR | Status: AC
  Administered 2022-04-11: 4 mg via INTRAVENOUS
  Filled 2022-04-11: qty 2

## 2022-04-11 MED ORDER — MELATONIN 5 MG PO TABS
5.0000 mg | ORAL_TABLET | Freq: Once | ORAL | Status: AC
  Administered 2022-04-11: 5 mg via ORAL
  Filled 2022-04-11: qty 1
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
## 2022-04-11 NOTE — ED Notes (Signed)
Patient transported to CT 

## 2022-04-11 NOTE — Telephone Encounter (Signed)
Pt called at 1413 stating she is going to be late for her infusion today (04/11/2022).  Pt stated she also wants to see Dr. Burr Medico today if possible.  Informed pt that Dr. Burr Medico is out of the office this week and will return to the office on Wednesday 04/17/2022.  Informed pt that this RN will have to check with infusion charge nurse Lexine Baton Wiley, RN) to see if infusion would see pt today if delayed arrival.  Pt was currently at home at time of call to this RN.  Pt's appts are as follows:  Port flush w/lab '@1415'$  and Infusion '@1500'$ .  Infusion Charge Nurse stated pt's appts today will need to be rescheduled.  Informed pt that her appts today will need to be rescheduled and this RN will send a scheduling message to have them rescheduled.  Pt stated she will go to the ED at [facility] d/t she's been vomiting blood as well as blood in her stools.  Instructed pt to please go to the ED for further evaluation of the hematemesis and stools.  Pt has hx of active GI bleed.  Pt verbalized understanding of instructions and agreed to going to ED.

## 2022-04-11 NOTE — ED Triage Notes (Signed)
Lower abdominal pain for for about 3 days. Pt says she vomited dark red blood on Saturday x1. Dark stools over the past week (x1 today). Dizziness, and Shortness of breath. Denies blood thinners. Pt reports she has hx of HHT and was suppose to have iron infusion today, but unable to go.

## 2022-04-11 NOTE — ED Provider Triage Note (Signed)
Emergency Medicine Provider Triage Evaluation Note  Peggy House , a 61 y.o. female  was evaluated in triage.  Pt complains of generalized abdominal pain, hematochezia, melena, nausea, and vomiting.  This been ongoing for 2 weeks.  She does endorse similar symptoms in the past with history of GI bleed.  Review of Systems  Positive:  Negative: See above   Physical Exam  BP 133/67   Pulse 76   Temp 98.2 F (36.8 C) (Oral)   Resp 18   SpO2 100%  Gen:   Awake, no distress   Resp:  Normal effort  MSK:   Moves extremities without difficulty  Other:  Mild diffuse generalize abdominal tenderness  Medical Decision Making  Medically screening exam initiated at 9:32 PM.  Appropriate orders placed.  FRANCIS YARDLEY was informed that the remainder of the evaluation will be completed by another provider, this initial triage assessment does not replace that evaluation, and the importance of remaining in the ED until their evaluation is complete.     Myna Bright Farmer, [place] 04/11/22 2135

## 2022-04-12 ENCOUNTER — Encounter (HOSPITAL_COMMUNITY)
Admission: EM | Disposition: A | Discharge status: Home / Self Care | Payer: Self-pay | Source: Home / Self Care | Attending: Family Medicine

## 2022-04-12 ENCOUNTER — Inpatient Hospital Stay (HOSPITAL_COMMUNITY): Payer: Medicaid Other | Admitting: Anesthesiology

## 2022-04-12 ENCOUNTER — Encounter (HOSPITAL_COMMUNITY): Payer: Self-pay | Admitting: Family Medicine

## 2022-04-12 DIAGNOSIS — F411 Generalized anxiety disorder: Secondary | ICD-10-CM | POA: Diagnosis not present

## 2022-04-12 DIAGNOSIS — Z452 Encounter for adjustment and management of vascular access device: Secondary | ICD-10-CM | POA: Diagnosis not present

## 2022-04-12 DIAGNOSIS — Z832 Family history of diseases of the blood and blood-forming organs and certain disorders involving the immune mechanism: Secondary | ICD-10-CM | POA: Diagnosis not present

## 2022-04-12 DIAGNOSIS — Z91030 Bee allergy status: Secondary | ICD-10-CM | POA: Diagnosis not present

## 2022-04-12 DIAGNOSIS — D62 Acute posthemorrhagic anemia: Secondary | ICD-10-CM | POA: Diagnosis not present

## 2022-04-12 DIAGNOSIS — E876 Hypokalemia: Secondary | ICD-10-CM | POA: Diagnosis not present

## 2022-04-12 DIAGNOSIS — Z79899 Other long term (current) drug therapy: Secondary | ICD-10-CM | POA: Diagnosis not present

## 2022-04-12 DIAGNOSIS — K429 Umbilical hernia without obstruction or gangrene: Secondary | ICD-10-CM | POA: Diagnosis not present

## 2022-04-12 DIAGNOSIS — F418 Other specified anxiety disorders: Secondary | ICD-10-CM

## 2022-04-12 DIAGNOSIS — I10 Essential (primary) hypertension: Secondary | ICD-10-CM | POA: Diagnosis not present

## 2022-04-12 DIAGNOSIS — Z888 Allergy status to other drugs, medicaments and biological substances status: Secondary | ICD-10-CM | POA: Diagnosis not present

## 2022-04-12 DIAGNOSIS — K31819 Angiodysplasia of stomach and duodenum without bleeding: Secondary | ICD-10-CM | POA: Diagnosis not present

## 2022-04-12 DIAGNOSIS — K449 Diaphragmatic hernia without obstruction or gangrene: Secondary | ICD-10-CM | POA: Diagnosis not present

## 2022-04-12 DIAGNOSIS — Z886 Allergy status to analgesic agent status: Secondary | ICD-10-CM | POA: Diagnosis not present

## 2022-04-12 DIAGNOSIS — I780 Hereditary hemorrhagic telangiectasia: Secondary | ICD-10-CM | POA: Diagnosis not present

## 2022-04-12 DIAGNOSIS — F32A Depression, unspecified: Secondary | ICD-10-CM | POA: Diagnosis not present

## 2022-04-12 DIAGNOSIS — K8020 Calculus of gallbladder without cholecystitis without obstruction: Secondary | ICD-10-CM | POA: Diagnosis not present

## 2022-04-12 DIAGNOSIS — K920 Hematemesis: Secondary | ICD-10-CM | POA: Diagnosis not present

## 2022-04-12 DIAGNOSIS — Z841 Family history of disorders of kidney and ureter: Secondary | ICD-10-CM | POA: Diagnosis not present

## 2022-04-12 DIAGNOSIS — I272 Pulmonary hypertension, unspecified: Secondary | ICD-10-CM | POA: Diagnosis not present

## 2022-04-12 DIAGNOSIS — Z833 Family history of diabetes mellitus: Secondary | ICD-10-CM | POA: Diagnosis not present

## 2022-04-12 DIAGNOSIS — K922 Gastrointestinal hemorrhage, unspecified: Secondary | ICD-10-CM | POA: Diagnosis not present

## 2022-04-12 DIAGNOSIS — Z91018 Allergy to other foods: Secondary | ICD-10-CM | POA: Diagnosis not present

## 2022-04-12 DIAGNOSIS — Z6837 Body mass index (BMI) 37.0-37.9, adult: Secondary | ICD-10-CM | POA: Diagnosis not present

## 2022-04-12 DIAGNOSIS — D649 Anemia, unspecified: Secondary | ICD-10-CM | POA: Diagnosis not present

## 2022-04-12 DIAGNOSIS — E785 Hyperlipidemia, unspecified: Secondary | ICD-10-CM | POA: Diagnosis not present

## 2022-04-12 DIAGNOSIS — K219 Gastro-esophageal reflux disease without esophagitis: Secondary | ICD-10-CM | POA: Diagnosis not present

## 2022-04-12 DIAGNOSIS — K5521 Angiodysplasia of colon with hemorrhage: Secondary | ICD-10-CM | POA: Diagnosis not present

## 2022-04-12 DIAGNOSIS — F17200 Nicotine dependence, unspecified, uncomplicated: Secondary | ICD-10-CM | POA: Diagnosis not present

## 2022-04-12 DIAGNOSIS — F17210 Nicotine dependence, cigarettes, uncomplicated: Secondary | ICD-10-CM | POA: Diagnosis not present

## 2022-04-12 DIAGNOSIS — E1151 Type 2 diabetes mellitus with diabetic peripheral angiopathy without gangrene: Secondary | ICD-10-CM | POA: Diagnosis not present

## 2022-04-12 DIAGNOSIS — K921 Melena: Secondary | ICD-10-CM | POA: Diagnosis not present

## 2022-04-12 DIAGNOSIS — R1032 Left lower quadrant pain: Secondary | ICD-10-CM | POA: Diagnosis not present

## 2022-04-12 DIAGNOSIS — D500 Iron deficiency anemia secondary to blood loss (chronic): Secondary | ICD-10-CM | POA: Diagnosis not present

## 2022-04-12 HISTORY — PX: HOT HEMOSTASIS: SHX5433

## 2022-04-12 HISTORY — PX: ESOPHAGOGASTRODUODENOSCOPY: SHX5428

## 2022-04-12 LAB — BASIC METABOLIC PANEL
Anion gap: 5 (ref 5–15)
BUN: 10 mg/dL (ref 8–23)
CO2: 23 mmol/L (ref 22–32)
Calcium: 8.6 mg/dL — ABNORMAL LOW (ref 8.9–10.3)
Chloride: 112 mmol/L — ABNORMAL HIGH (ref 98–111)
Creatinine, Ser: 0.87 mg/dL (ref 0.44–1.00)
GFR, Estimated: >60 mL/min (ref >60)
Glucose, Bld: 99 mg/dL (ref 70–99)
Potassium: 3.5 mmol/L (ref 3.5–5.1)
Sodium: 140 mmol/L (ref 135–145)

## 2022-04-12 LAB — MAGNESIUM: Magnesium: 1.8 mg/dL (ref 1.7–2.4)

## 2022-04-12 LAB — GLUCOSE, CAPILLARY
Glucose-Capillary: 91 mg/dL (ref 70–99)
Glucose-Capillary: 85 mg/dL (ref 70–99)
Glucose-Capillary: 91 mg/dL (ref 70–99)
Glucose-Capillary: 80 mg/dL (ref 70–99)

## 2022-04-12 LAB — HEMOGLOBIN
Hemoglobin: 6.4 g/dL — CL (ref 12.0–15.0)
Hemoglobin: 6.4 g/dL — CL (ref 12.0–15.0)
Hemoglobin: 8.5 g/dL — ABNORMAL LOW (ref 12.0–15.0)

## 2022-04-12 LAB — PREPARE RBC (CROSSMATCH)

## 2022-04-12 LAB — HEMATOCRIT
HCT: 21.1 % — ABNORMAL LOW (ref 36.0–46.0)
HCT: 21 % — ABNORMAL LOW (ref 36.0–46.0)
HCT: 27.3 % — ABNORMAL LOW (ref 36.0–46.0)

## 2022-04-12 LAB — HIV ANTIBODY (ROUTINE TESTING W REFLEX): HIV Screen 4th Generation wRfx: NONREACTIVE

## 2022-04-12 LAB — MRSA NEXT GEN BY PCR, NASAL: MRSA by PCR Next Gen: NOT DETECTED

## 2022-04-12 SURGERY — EGD (ESOPHAGOGASTRODUODENOSCOPY)
Anesthesia: Monitor Anesthesia Care

## 2022-04-12 MED ORDER — LACTATED RINGERS IV SOLN: INTRAVENOUS | Status: DC | PRN

## 2022-04-12 MED ORDER — ONDANSETRON HCL 4 MG/2ML IJ SOLN
4.0000 mg | Freq: Four times a day (QID) | INTRAMUSCULAR | Status: DC | PRN
  Administered 2022-04-12 – 2022-04-13 (×3): 4 mg via INTRAVENOUS
  Filled 2022-04-12 (×3): qty 2

## 2022-04-12 MED ORDER — ORAL CARE MOUTH RINSE: 15.0000 mL | OROMUCOSAL | Status: DC | PRN

## 2022-04-12 MED ORDER — SODIUM CHLORIDE 0.9 % IV SOLN: INTRAVENOUS | Status: DC

## 2022-04-12 MED ORDER — SODIUM CHLORIDE 0.9 % IV SOLN
125.0000 mg | Freq: Once | INTRAVENOUS | Status: AC
  Administered 2022-04-12: 125 mg via INTRAVENOUS
  Filled 2022-04-12: qty 10

## 2022-04-12 MED ORDER — ALPRAZOLAM 0.5 MG PO TABS: 0.5000 mg | ORAL_TABLET | Freq: Every evening | ORAL | Status: DC | PRN

## 2022-04-12 MED ORDER — LIDOCAINE HCL (CARDIAC) PF 100 MG/5ML IV SOSY
PREFILLED_SYRINGE | INTRAVENOUS | Status: DC | PRN
  Administered 2022-04-12: 100 mg via INTRAVENOUS

## 2022-04-12 MED ORDER — HYDROMORPHONE HCL 1 MG/ML IJ SOLN
0.5000 mg | INTRAMUSCULAR | Status: DC | PRN
  Administered 2022-04-12 – 2022-04-13 (×3): 0.5 mg via INTRAVENOUS
  Filled 2022-04-12: qty 0.5
  Filled 2022-04-12 (×2): qty 1

## 2022-04-12 MED ORDER — ACETAMINOPHEN 650 MG RE SUPP: 650.0000 mg | Freq: Four times a day (QID) | RECTAL | Status: DC | PRN

## 2022-04-12 MED ORDER — ACITRETIN 10 MG PO CAPS: 10.0000 mg | ORAL_CAPSULE | Freq: Every day | ORAL | Status: DC

## 2022-04-12 MED ORDER — METOPROLOL SUCCINATE ER 25 MG PO TB24: 100.0000 mg | ORAL_TABLET | Freq: Every day | ORAL | Status: DC

## 2022-04-12 MED ORDER — SODIUM CHLORIDE 0.9% IV SOLUTION: Freq: Once | INTRAVENOUS | Status: DC

## 2022-04-12 MED ORDER — ONDANSETRON HCL 4 MG PO TABS: 4.0000 mg | ORAL_TABLET | Freq: Four times a day (QID) | ORAL | Status: DC | PRN

## 2022-04-12 MED ORDER — AMLODIPINE BESYLATE 10 MG PO TABS
10.0000 mg | ORAL_TABLET | Freq: Every day | ORAL | Status: DC
  Administered 2022-04-12 – 2022-04-14 (×3): 10 mg via ORAL
  Filled 2022-04-12 (×3): qty 1

## 2022-04-12 MED ORDER — CHLORHEXIDINE GLUCONATE CLOTH 2 % EX PADS
6.0000 | MEDICATED_PAD | Freq: Every day | CUTANEOUS | Status: DC
  Administered 2022-04-12 – 2022-04-14 (×3): 6 via TOPICAL

## 2022-04-12 MED ORDER — TRAZODONE HCL 100 MG PO TABS
100.0000 mg | ORAL_TABLET | Freq: Every day | ORAL | Status: DC
  Administered 2022-04-12 – 2022-04-13 (×2): 100 mg via ORAL
  Filled 2022-04-12: qty 1
  Filled 2022-04-12: qty 2

## 2022-04-12 MED ORDER — PROPOFOL 10 MG/ML IV BOLUS
INTRAVENOUS | Status: DC | PRN
  Administered 2022-04-12: 30 mg via INTRAVENOUS
  Administered 2022-04-12: 20 mg via INTRAVENOUS
  Administered 2022-04-12: 40 mg via INTRAVENOUS
  Administered 2022-04-12: 30 mg via INTRAVENOUS
  Administered 2022-04-12: 50 mg via INTRAVENOUS
  Administered 2022-04-12: 30 mg via INTRAVENOUS

## 2022-04-12 MED ORDER — METOPROLOL SUCCINATE ER 25 MG PO TB24
50.0000 mg | ORAL_TABLET | Freq: Every day | ORAL | Status: DC
  Administered 2022-04-12: 50 mg via ORAL
  Filled 2022-04-12 (×2): qty 2

## 2022-04-12 MED ORDER — DIPHENHYDRAMINE HCL 25 MG PO CAPS: 25.0000 mg | ORAL_CAPSULE | Freq: Once | ORAL | Status: DC

## 2022-04-12 MED ORDER — SODIUM CHLORIDE 0.9% FLUSH
3.0000 mL | Freq: Two times a day (BID) | INTRAVENOUS | Status: DC
  Administered 2022-04-12 – 2022-04-13 (×5): 3 mL via INTRAVENOUS

## 2022-04-12 MED ORDER — ACETAMINOPHEN 325 MG PO TABS: 650.0000 mg | ORAL_TABLET | Freq: Once | ORAL | Status: DC

## 2022-04-12 MED ORDER — SERTRALINE HCL 100 MG PO TABS
100.0000 mg | ORAL_TABLET | Freq: Every day | ORAL | Status: DC
  Administered 2022-04-12 – 2022-04-14 (×3): 100 mg via ORAL
  Filled 2022-04-12 (×3): qty 1

## 2022-04-12 MED ORDER — ALBUTEROL SULFATE (2.5 MG/3ML) 0.083% IN NEBU: 3.0000 mL | INHALATION_SOLUTION | RESPIRATORY_TRACT | Status: DC | PRN

## 2022-04-12 MED ORDER — ACETAMINOPHEN 325 MG PO TABS: 650.0000 mg | ORAL_TABLET | Freq: Four times a day (QID) | ORAL | Status: DC | PRN

## 2022-04-12 MED ORDER — FUROSEMIDE 10 MG/ML IJ SOLN
20.0000 mg | Freq: Once | INTRAMUSCULAR | Status: AC
  Administered 2022-04-12: 20 mg via INTRAVENOUS
  Filled 2022-04-12: qty 2
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
## 2022-04-12 NOTE — Op Note (Signed)
[facility] Patient Name: Peggy House Procedure Date: 04/12/2022 MRN: [id number] Attending MD: Clarene Essex , MD, [id number] Date of Birth: December 03, 1960 CSN: [id number] Age: 61 Admit Type: Inpatient Procedure:                Upper GI endoscopy Indications:              Acute post hemorrhagic anemia, Hematemesis, Melena Providers:                Clarene Essex, MD, Fanny Skates RN, RN, Benetta Spar, Technician Referring MD:              Medicines:                Monitored Anesthesia Care Complications:            No immediate complications. Estimated Blood Loss:     Estimated blood loss: none. Procedure:                Pre-Anesthesia Assessment:                           - Prior to the procedure, a History and Physical                            was performed, and patient medications and                            allergies were reviewed. The patient's tolerance of                            previous anesthesia was also reviewed. The risks                            and benefits of the procedure and the sedation                            options and risks were discussed with the patient.                            All questions were answered, and informed consent                            was obtained. Prior Anticoagulants: The patient has                            taken no anticoagulant or antiplatelet agents. ASA                            Grade Assessment: III - A patient with severe                            systemic disease. After reviewing the risks and  benefits, the patient was deemed in satisfactory                            condition to undergo the procedure.                           After obtaining informed consent, the endoscope was                            passed under direct vision. Throughout the                            procedure, the patient's blood pressure, pulse, and                             oxygen saturations were monitored continuously. The                            [id number]-H190 ([id number]) Olympus endoscope was introduced                            through the mouth, and advanced to the third part                            of duodenum. The upper GI endoscopy was                            accomplished without difficulty. The patient                            tolerated the procedure well. Scope In: Scope Out: Findings:      The larynx was normal.      A small hiatal hernia was present.      Clotted blood was found in the gastric fundus and in the gastric body.      Five small angiodysplastic lesions with no bleeding were found in the       gastric body. Fulguration to ablate the lesion to prevent bleeding by       argon plasma at 2 liters/minute and 30 watts was successful.      The duodenal bulb, first portion of the duodenum, second portion of the       duodenum and third portion of the duodenum were normal.      The exam was otherwise without abnormality. Impression:               - Normal larynx.                           - Small hiatal hernia.                           - Clotted blood in the gastric fundus and in the                            gastric body.                           -  Five non-bleeding angiodysplastic lesions in the                            stomach. Treated with argon plasma coagulation                            (APC).                           - Normal duodenal bulb, first portion of the                            duodenum, second portion of the duodenum and third                            portion of the duodenum.                           - The examination was otherwise normal.                           - No specimens collected. Moderate Sedation:      Not Applicable - Patient had care per Anesthesia. Recommendation:           - Clear liquid diet today.                           - Continue present medications.                            - Return to GI clinic PRN.                           - Telephone GI clinic if symptomatic PRN.                           - Repeat upper endoscopy PRN for retreatment and to                            evaluate parts of the stomach that the clot could                            not be washed and suctioned and could consider                            capsule endoscopy to reevaluate the number of small                            bowel AVMs present. Procedure Code(s):        --- Professional ---                           [phone], Esophagogastroduodenoscopy, flexible,                            transoral; with control of bleeding,  any method Diagnosis Code(s):        --- Professional ---                           K44.9, Diaphragmatic hernia without obstruction or                            gangrene                           K92.2, Gastrointestinal hemorrhage, unspecified                           K31.819, Angiodysplasia of stomach and duodenum                            without bleeding                           D62, Acute posthemorrhagic anemia                           K92.0, Hematemesis                           K92.1, Melena (includes Hematochezia) CPT copyright 2022 [employer]. All rights reserved. The codes documented in this report are preliminary and upon coder review may  be revised to meet current compliance requirements. Clarene Essex, MD 04/12/2022 11:37:35 AM This report has been signed electronically. Number of Addenda: 0

## 2022-04-12 NOTE — Transfer of Care (Signed)
Immediate Anesthesia Transfer of Care Note  Patient: Peggy House  Procedure(s) Performed: ESOPHAGOGASTRODUODENOSCOPY (EGD) HOT HEMOSTASIS (ARGON PLASMA COAGULATION/BICAP)  Patient Location: PACU and Endoscopy Unit  Anesthesia Type:MAC  Level of Consciousness: awake, alert , and oriented  Airway & Oxygen Therapy: Patient Spontanous Breathing  Post-op Assessment: Report given to RN and Post -op Vital signs reviewed and stable  Post vital signs: Reviewed and stable  Last Vitals:  Vitals Value Taken Time  BP 131/49 04/12/22 1126  Temp    Pulse 61 04/12/22 1127  Resp 14 04/12/22 1127  SpO2 99 % 04/12/22 1127  Vitals shown include unvalidated device data.  Last Pain:  Vitals:   04/12/22 1035  TempSrc: Temporal  PainSc:          Complications: No notable events documented.
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
## 2022-04-12 NOTE — Progress Notes (Signed)
Peggy House 11:53 AM  Subjective: Patient seen and examined please see PA's consult note and her hospital computer chart reviewed and she is familiar to our team and her previous procedures reviewed and she is followed by hematology who gives her periodic iron and transfusions as needed and she had coffee-ground emesis yesterday but black stools for a few days and denies any new medicine aspirin nonsteroidals or blood thinners  Objective: Vital signs stable afebrile no acute distress exam please see preassessment evaluation labs reviewed CT reviewed  Assessment: Hereditary telangiectasias  Plan: Okay to proceed with endoscopy with anesthesia assistance with further workup and plans pending those findings  [facility]  office [phone] After 5PM or if no answer call [phone]

## 2022-04-12 NOTE — Anesthesia Postprocedure Evaluation (Signed)
Anesthesia Post Note  Patient: GWENDELYN LANTING  Procedure(s) Performed: ESOPHAGOGASTRODUODENOSCOPY (EGD) HOT HEMOSTASIS (ARGON PLASMA COAGULATION/BICAP)     Patient location during evaluation: PACU Anesthesia Type: MAC Level of consciousness: awake and alert Pain management: pain level controlled Vital Signs Assessment: post-procedure vital signs reviewed and stable Respiratory status: spontaneous breathing, nonlabored ventilation, respiratory function stable and patient connected to nasal cannula oxygen Cardiovascular status: stable and blood pressure returned to baseline Postop Assessment: no apparent nausea or vomiting Anesthetic complications: no  No notable events documented.  Last Vitals:  Vitals:   04/12/22 1143 04/12/22 1150  BP: (!) 143/76   Pulse: 64   Resp: 16   Temp: 36.6 C   SpO2: 98% 97%    Last Pain:  Vitals:   04/12/22 1150  TempSrc:   PainSc: 0-No pain                 Nanami Whitelaw

## 2022-04-12 NOTE — Care Management (Signed)
  Transition of Care [facility]) Screening Note   Patient Details  Name: Peggy House Date of Birth: 1960-04-19   Transition of Care [facility]) CM/SW Contact:    Roseanne Kaufman, RN Phone Number: 04/12/2022, 6:44 PM  From home with family, TOC will continue to follow for needs.  Transition of Care Department [facility]) has reviewed patient and no TOC needs have been identified at this time. We will continue to monitor patient advancement through interdisciplinary progression rounds. If new patient transition needs arise, please place a TOC consult.

## 2022-04-13 DIAGNOSIS — K922 Gastrointestinal hemorrhage, unspecified: Secondary | ICD-10-CM | POA: Diagnosis not present

## 2022-04-13 DIAGNOSIS — D62 Acute posthemorrhagic anemia: Secondary | ICD-10-CM | POA: Diagnosis not present

## 2022-04-13 DIAGNOSIS — K921 Melena: Secondary | ICD-10-CM | POA: Diagnosis not present

## 2022-04-13 LAB — BPAM RBC
Blood Product Expiration Date: 202401282359
Blood Product Expiration Date: 202401282359
Blood Product Expiration Date: 202401282359
Blood Product Expiration Date: 202401292359
ISSUE DATE / TIME: 202312282321
ISSUE DATE / TIME: 202312290311
ISSUE DATE / TIME: 202312291018
ISSUE DATE / TIME: 202312291018
Unit Type and Rh: 600
Unit Type and Rh: 600
Unit Type and Rh: 600
Unit Type and Rh: 600

## 2022-04-13 LAB — TYPE AND SCREEN
ABO/RH(D): A NEG
Antibody Screen: NEGATIVE
Unit division: 0
Unit division: 0
Unit division: 0
Unit division: 0

## 2022-04-13 LAB — BASIC METABOLIC PANEL
Anion gap: 6 (ref 5–15)
BUN: 8 mg/dL (ref 8–23)
CO2: 26 mmol/L (ref 22–32)
Calcium: 8.4 mg/dL — ABNORMAL LOW (ref 8.9–10.3)
Chloride: 106 mmol/L (ref 98–111)
Creatinine, Ser: 0.85 mg/dL (ref 0.44–1.00)
GFR, Estimated: >60 mL/min (ref >60)
Glucose, Bld: 93 mg/dL (ref 70–99)
Potassium: 3 mmol/L — ABNORMAL LOW (ref 3.5–5.1)
Sodium: 138 mmol/L (ref 135–145)

## 2022-04-13 LAB — GLUCOSE, CAPILLARY
Glucose-Capillary: 105 mg/dL — ABNORMAL HIGH (ref 70–99)
Glucose-Capillary: 87 mg/dL (ref 70–99)
Glucose-Capillary: 94 mg/dL (ref 70–99)
Glucose-Capillary: 91 mg/dL (ref 70–99)
Glucose-Capillary: 92 mg/dL (ref 70–99)
Glucose-Capillary: 107 mg/dL — ABNORMAL HIGH (ref 70–99)
Glucose-Capillary: 107 mg/dL — ABNORMAL HIGH (ref 70–99)

## 2022-04-13 LAB — CBC
HCT: 27.5 % — ABNORMAL LOW (ref 36.0–46.0)
Hemoglobin: 8.6 g/dL — ABNORMAL LOW (ref 12.0–15.0)
MCH: 26.2 pg (ref 26.0–34.0)
MCHC: 31.3 g/dL (ref 30.0–36.0)
MCV: 83.8 fL (ref 80.0–100.0)
Platelets: 309 10*3/uL (ref 150–400)
RBC: 3.28 MIL/uL — ABNORMAL LOW (ref 3.87–5.11)
RDW: 18.6 % — ABNORMAL HIGH (ref 11.5–15.5)
WBC: 12.6 10*3/uL — ABNORMAL HIGH (ref 4.0–10.5)
nRBC: 0.2 % (ref 0.0–0.2)

## 2022-04-13 MED ORDER — POTASSIUM CHLORIDE CRYS ER 20 MEQ PO TBCR: 50.0000 meq | EXTENDED_RELEASE_TABLET | Freq: Once | ORAL | Status: DC

## 2022-04-13 MED ORDER — PANTOPRAZOLE SODIUM 40 MG IV SOLR
40.0000 mg | Freq: Two times a day (BID) | INTRAVENOUS | Status: DC
  Administered 2022-04-13: 40 mg via INTRAVENOUS
  Filled 2022-04-13 (×2): qty 10

## 2022-04-13 MED ORDER — SODIUM CHLORIDE 0.9% FLUSH: 10.0000 mL | INTRAVENOUS | Status: DC | PRN

## 2022-04-13 MED ORDER — POTASSIUM CHLORIDE CRYS ER 20 MEQ PO TBCR
50.0000 meq | EXTENDED_RELEASE_TABLET | Freq: Once | ORAL | Status: AC
  Administered 2022-04-13: 50 meq via ORAL
  Filled 2022-04-13: qty 1

## 2022-04-13 NOTE — Plan of Care (Signed)

## 2022-04-13 NOTE — Progress Notes (Signed)
Subjective: No further melena. Mild abdominal pain (unchanged from before).  Objective: Vital signs in last 24 hours: Temp:  [98.1 F (36.7 C)-99 F (37.2 C)] 98.4 F (36.9 C) (12/30 0800) Pulse Rate:  [46-85] 56 (12/30 1100) Resp:  [14-20] 16 (12/30 1100) BP: (138-180)/(58-127) 147/65 (12/30 1100) SpO2:  [77 %-100 %] 92 % (12/30 1100) Weight:  [102.6 kg] 102.6 kg (12/30 0409) Weight change: -0.9 kg Last BM Date : 04/11/22  PE: GEN:  NAD, overweight ABD:  Soft, mild epigastric tenderness without peritonitis  Lab Results: CBC    Component Value Date/Time   WBC 12.6 (H) 04/13/2022 0415   RBC 3.28 (L) 04/13/2022 0415   HGB 8.6 (L) 04/13/2022 0415   HGB 6.5 (LL) 03/29/2022 1302   HGB 8.6 (L) 06/14/2020 1626   HGB 8.1 (L) 02/05/2017 1407   HCT 27.5 (L) 04/13/2022 0415   HCT 28.2 (L) 06/14/2020 1626   HCT 26.6 (L) 02/05/2017 1407   PLT 309 04/13/2022 0415   PLT 280 03/29/2022 1302   PLT 353 06/14/2020 1626   MCV 83.8 04/13/2022 0415   MCV 89 06/14/2020 1626   MCV 82.8 02/05/2017 1407   MCH 26.2 04/13/2022 0415   MCHC 31.3 04/13/2022 0415   RDW 18.6 (H) 04/13/2022 0415   RDW 18.9 (H) 06/14/2020 1626   RDW 22.6 (H) 02/05/2017 1407   LYMPHSABS 1.3 03/29/2022 1302   LYMPHSABS 2.5 05/20/2017 1551   LYMPHSABS 2.6 02/05/2017 1407   MONOABS 0.5 03/29/2022 1302   MONOABS 0.5 02/05/2017 1407   EOSABS 0.2 03/29/2022 1302   EOSABS 0.1 05/20/2017 1551   BASOSABS 0.0 03/29/2022 1302   BASOSABS 0.0 05/20/2017 1551   BASOSABS 0.1 02/05/2017 1407  CMP     Component Value Date/Time   NA 138 04/13/2022 0415   NA 147 (H) 04/02/2017 1708   NA 141 02/05/2017 1407   K 3.0 (L) 04/13/2022 0415   K 3.5 02/05/2017 1407   CL 106 04/13/2022 0415   CO2 26 04/13/2022 0415   CO2 24 02/05/2017 1407   GLUCOSE 93 04/13/2022 0415   GLUCOSE 134 02/05/2017 1407   BUN 8 04/13/2022 0415   BUN 5 (L) 04/02/2017 1708   BUN 9.3 02/05/2017 1407   CREATININE 0.85 04/13/2022 0415   CREATININE  0.77 09/13/2021 1140   CREATININE 0.9 02/05/2017 1407   CALCIUM 8.4 (L) 04/13/2022 0415   CALCIUM 8.9 02/05/2017 1407   PROT 6.7 04/11/2022 2127   PROT 6.7 04/02/2017 1708   PROT 7.1 02/05/2017 1407   ALBUMIN 3.0 (L) 04/11/2022 2127   ALBUMIN 3.8 04/02/2017 1708   ALBUMIN 3.5 02/05/2017 1407   AST 13 (L) 04/11/2022 2127   AST 12 (L) 09/13/2021 1140   AST 13 02/05/2017 1407   ALT 6 04/11/2022 2127   ALT 5 09/13/2021 1140   ALT 8 02/05/2017 1407   ALKPHOS 52 04/11/2022 2127   ALKPHOS 84 02/05/2017 1407   BILITOT 0.1 (L) 04/11/2022 2127   BILITOT 0.3 09/13/2021 1140   BILITOT 0.23 02/05/2017 1407   GFRNONAA >60 04/13/2022 0415   GFRNONAA >60 09/13/2021 1140   GFRAA >60 01/06/2020 0921   Assessment:   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  No further melena. Anemia.  Stable.  Plan:   Decrease to IV BID PPI. Advance diet to full liquids. Follow CBC. Hopefully home in the next day or two. [facility] will follow.   Landry Dyke  04/13/2022, 11:50 AM   Cell [phone] If no answer or after 5 PM call [phone]
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
## 2022-04-14 ENCOUNTER — Encounter (HOSPITAL_COMMUNITY): Payer: Self-pay | Admitting: Gastroenterology

## 2022-04-14 DIAGNOSIS — D649 Anemia, unspecified: Secondary | ICD-10-CM | POA: Diagnosis not present

## 2022-04-14 DIAGNOSIS — D62 Acute posthemorrhagic anemia: Secondary | ICD-10-CM | POA: Diagnosis not present

## 2022-04-14 DIAGNOSIS — K921 Melena: Secondary | ICD-10-CM | POA: Diagnosis not present

## 2022-04-14 DIAGNOSIS — K922 Gastrointestinal hemorrhage, unspecified: Secondary | ICD-10-CM | POA: Diagnosis not present

## 2022-04-14 LAB — CBC
HCT: 28.7 % — ABNORMAL LOW (ref 36.0–46.0)
Hemoglobin: 8.6 g/dL — ABNORMAL LOW (ref 12.0–15.0)
MCH: 25.8 pg — ABNORMAL LOW (ref 26.0–34.0)
MCHC: 30 g/dL (ref 30.0–36.0)
MCV: 86.2 fL (ref 80.0–100.0)
Platelets: 298 10*3/uL (ref 150–400)
RBC: 3.33 MIL/uL — ABNORMAL LOW (ref 3.87–5.11)
RDW: 19.2 % — ABNORMAL HIGH (ref 11.5–15.5)
WBC: 14.1 10*3/uL — ABNORMAL HIGH (ref 4.0–10.5)
nRBC: 0.1 % (ref 0.0–0.2)

## 2022-04-14 LAB — BASIC METABOLIC PANEL
Anion gap: 5 (ref 5–15)
BUN: 6 mg/dL — ABNORMAL LOW (ref 8–23)
CO2: 25 mmol/L (ref 22–32)
Calcium: 8.3 mg/dL — ABNORMAL LOW (ref 8.9–10.3)
Chloride: 109 mmol/L (ref 98–111)
Creatinine, Ser: 0.82 mg/dL (ref 0.44–1.00)
GFR, Estimated: >60 mL/min (ref >60)
Glucose, Bld: 96 mg/dL (ref 70–99)
Potassium: 3.6 mmol/L (ref 3.5–5.1)
Sodium: 139 mmol/L (ref 135–145)

## 2022-04-14 LAB — GLUCOSE, CAPILLARY
Glucose-Capillary: 97 mg/dL (ref 70–99)
Glucose-Capillary: 93 mg/dL (ref 70–99)
Glucose-Capillary: 116 mg/dL — ABNORMAL HIGH (ref 70–99)

## 2022-04-14 LAB — FERRITIN: Ferritin: 50 ng/mL (ref 11–307)

## 2022-04-14 LAB — MAGNESIUM: Magnesium: 1.8 mg/dL (ref 1.7–2.4)

## 2022-04-14 MED ORDER — HEPARIN SOD (PORK) LOCK FLUSH 100 UNIT/ML IV SOLN
500.0000 [IU] | INTRAVENOUS | Status: AC | PRN
  Administered 2022-04-14: 500 [IU]

## 2022-04-14 NOTE — Progress Notes (Signed)
Subjective: No further bleeding.  Objective: Vital signs in last 24 hours: Temp:  [98 F (36.7 C)-98.9 F (37.2 C)] 98 F (36.7 C) (12/31 1210) Pulse Rate:  [49-70] 58 (12/31 1210) Resp:  [14-22] 17 (12/31 1210) BP: (109-157)/(59-83) 109/62 (12/31 1210) SpO2:  [96 %-100 %] 100 % (12/31 1210) Weight change:  Last BM Date : 04/11/22  PE: GEN:  NAD, overweight NEURO:  No encephalopathy  Lab Results:   Assessment:   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  Endoscopy 12/29 with 5 AVMs treated with APC.  No further melena. Anemia.  Stable.  Plan:   Pantoprazole 40 mg po bid upon discharge. OK to discharge home today from GI perspective. Will arrange outpatient follow-up with Dr. Watt Climes. [facility] will sign-off; please call with questions; thank you for the consultation.   Landry Dyke 04/14/2022, 2:01 PM   Cell [phone] If no answer or after 5 PM call [phone]

## 2022-04-14 NOTE — Progress Notes (Signed)
Patient's HR drops down into the 30's and then increases to 40's and 50's. NP Zebedee Iba was notified to see if she wants to reorder cardiac monitoring.
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
## 2022-04-14 NOTE — Progress Notes (Addendum)
Reviewed written d/c instructions w pt and all questions answered. She verbalized understanding. D/C via w/c w all belongings in stable condition. 

## 2022-04-22 ENCOUNTER — Inpatient Hospital Stay: Payer: Medicaid Other

## 2022-04-22 ENCOUNTER — Inpatient Hospital Stay: Payer: Medicaid Other | Attending: Hematology

## 2022-05-17 ENCOUNTER — Telehealth: Payer: Self-pay | Admitting: Hematology

## 2022-05-17 NOTE — Telephone Encounter (Signed)
Per 2/2 IB, msg left

## 2022-05-23 ENCOUNTER — Telehealth: Payer: Self-pay | Admitting: Hematology

## 2022-05-23 ENCOUNTER — Other Ambulatory Visit: Payer: Self-pay

## 2022-05-23 DIAGNOSIS — D500 Iron deficiency anemia secondary to blood loss (chronic): Secondary | ICD-10-CM

## 2022-05-23 DIAGNOSIS — I780 Hereditary hemorrhagic telangiectasia: Secondary | ICD-10-CM

## 2022-05-23 NOTE — Telephone Encounter (Signed)
Called patient per 2/8 secure chat. Left voicemail with appointment details.

## 2022-05-24 ENCOUNTER — Inpatient Hospital Stay: Payer: Medicaid Other

## 2022-05-24 ENCOUNTER — Inpatient Hospital Stay: Payer: Medicaid Other | Attending: Hematology

## 2022-05-24 DIAGNOSIS — N3941 Urge incontinence: Secondary | ICD-10-CM | POA: Diagnosis not present

## 2022-05-24 DIAGNOSIS — N319 Neuromuscular dysfunction of bladder, unspecified: Secondary | ICD-10-CM | POA: Diagnosis not present

## 2022-05-25 ENCOUNTER — Other Ambulatory Visit: Payer: Self-pay

## 2022-05-25 ENCOUNTER — Emergency Department (HOSPITAL_COMMUNITY): Payer: Medicaid Other

## 2022-05-25 ENCOUNTER — Encounter (HOSPITAL_COMMUNITY): Payer: Self-pay

## 2022-05-25 ENCOUNTER — Inpatient Hospital Stay (HOSPITAL_COMMUNITY)
Admission: EM | Admit: 2022-05-25 | Discharge: 2022-05-29 | DRG: 378 | Disposition: A | Discharge status: Home Health | Payer: Medicaid Other | Attending: Internal Medicine | Admitting: Internal Medicine

## 2022-05-25 DIAGNOSIS — Z91018 Allergy to other foods: Secondary | ICD-10-CM

## 2022-05-25 DIAGNOSIS — E1159 Type 2 diabetes mellitus with other circulatory complications: Secondary | ICD-10-CM

## 2022-05-25 DIAGNOSIS — K8020 Calculus of gallbladder without cholecystitis without obstruction: Secondary | ICD-10-CM | POA: Diagnosis not present

## 2022-05-25 DIAGNOSIS — D62 Acute posthemorrhagic anemia: Secondary | ICD-10-CM | POA: Diagnosis not present

## 2022-05-25 DIAGNOSIS — M47819 Spondylosis without myelopathy or radiculopathy, site unspecified: Secondary | ICD-10-CM | POA: Diagnosis present

## 2022-05-25 DIAGNOSIS — F32A Depression, unspecified: Secondary | ICD-10-CM | POA: Diagnosis present

## 2022-05-25 DIAGNOSIS — N281 Cyst of kidney, acquired: Secondary | ICD-10-CM | POA: Diagnosis not present

## 2022-05-25 DIAGNOSIS — F17210 Nicotine dependence, cigarettes, uncomplicated: Secondary | ICD-10-CM | POA: Diagnosis not present

## 2022-05-25 DIAGNOSIS — Z833 Family history of diabetes mellitus: Secondary | ICD-10-CM | POA: Diagnosis not present

## 2022-05-25 DIAGNOSIS — R051 Acute cough: Secondary | ICD-10-CM

## 2022-05-25 DIAGNOSIS — Z8041 Family history of malignant neoplasm of ovary: Secondary | ICD-10-CM

## 2022-05-25 DIAGNOSIS — E669 Obesity, unspecified: Secondary | ICD-10-CM | POA: Diagnosis not present

## 2022-05-25 DIAGNOSIS — Z800 Family history of malignant neoplasm of digestive organs: Secondary | ICD-10-CM | POA: Diagnosis not present

## 2022-05-25 DIAGNOSIS — Z91038 Other insect allergy status: Secondary | ICD-10-CM

## 2022-05-25 DIAGNOSIS — Z79899 Other long term (current) drug therapy: Secondary | ICD-10-CM

## 2022-05-25 DIAGNOSIS — Z841 Family history of disorders of kidney and ureter: Secondary | ICD-10-CM | POA: Diagnosis not present

## 2022-05-25 DIAGNOSIS — M170 Bilateral primary osteoarthritis of knee: Secondary | ICD-10-CM | POA: Diagnosis present

## 2022-05-25 DIAGNOSIS — Z888 Allergy status to other drugs, medicaments and biological substances status: Secondary | ICD-10-CM

## 2022-05-25 DIAGNOSIS — I10 Essential (primary) hypertension: Secondary | ICD-10-CM | POA: Diagnosis present

## 2022-05-25 DIAGNOSIS — R9431 Abnormal electrocardiogram [ECG] [EKG]: Secondary | ICD-10-CM | POA: Diagnosis not present

## 2022-05-25 DIAGNOSIS — Z7984 Long term (current) use of oral hypoglycemic drugs: Secondary | ICD-10-CM

## 2022-05-25 DIAGNOSIS — K219 Gastro-esophageal reflux disease without esophagitis: Secondary | ICD-10-CM | POA: Diagnosis present

## 2022-05-25 DIAGNOSIS — Z1152 Encounter for screening for COVID-19: Secondary | ICD-10-CM | POA: Diagnosis not present

## 2022-05-25 DIAGNOSIS — R531 Weakness: Secondary | ICD-10-CM | POA: Diagnosis not present

## 2022-05-25 DIAGNOSIS — I272 Pulmonary hypertension, unspecified: Secondary | ICD-10-CM | POA: Diagnosis not present

## 2022-05-25 DIAGNOSIS — Z6837 Body mass index (BMI) 37.0-37.9, adult: Secondary | ICD-10-CM

## 2022-05-25 DIAGNOSIS — R112 Nausea with vomiting, unspecified: Secondary | ICD-10-CM | POA: Diagnosis not present

## 2022-05-25 DIAGNOSIS — K922 Gastrointestinal hemorrhage, unspecified: Secondary | ICD-10-CM | POA: Diagnosis not present

## 2022-05-25 DIAGNOSIS — E785 Hyperlipidemia, unspecified: Secondary | ICD-10-CM | POA: Diagnosis present

## 2022-05-25 DIAGNOSIS — D72829 Elevated white blood cell count, unspecified: Secondary | ICD-10-CM | POA: Diagnosis not present

## 2022-05-25 DIAGNOSIS — N2889 Other specified disorders of kidney and ureter: Secondary | ICD-10-CM | POA: Diagnosis not present

## 2022-05-25 DIAGNOSIS — R059 Cough, unspecified: Secondary | ICD-10-CM | POA: Diagnosis not present

## 2022-05-25 DIAGNOSIS — K920 Hematemesis: Secondary | ICD-10-CM | POA: Diagnosis not present

## 2022-05-25 DIAGNOSIS — E119 Type 2 diabetes mellitus without complications: Secondary | ICD-10-CM | POA: Diagnosis not present

## 2022-05-25 DIAGNOSIS — Z832 Family history of diseases of the blood and blood-forming organs and certain disorders involving the immune mechanism: Secondary | ICD-10-CM

## 2022-05-25 DIAGNOSIS — K31811 Angiodysplasia of stomach and duodenum with bleeding: Secondary | ICD-10-CM | POA: Diagnosis not present

## 2022-05-25 DIAGNOSIS — F419 Anxiety disorder, unspecified: Secondary | ICD-10-CM | POA: Diagnosis present

## 2022-05-25 LAB — RESP PANEL BY RT-PCR (RSV, FLU A&B, COVID)  RVPGX2
Influenza A by PCR: NEGATIVE
Influenza B by PCR: NEGATIVE
Resp Syncytial Virus by PCR: NEGATIVE
SARS Coronavirus 2 by RT PCR: NEGATIVE

## 2022-05-25 LAB — CBC WITH DIFFERENTIAL/PLATELET
Abs Immature Granulocytes: 0.05 10*3/uL (ref 0.00–0.07)
Basophils Absolute: 0 10*3/uL (ref 0.0–0.1)
Basophils Relative: 0 %
Eosinophils Absolute: 0 10*3/uL (ref 0.0–0.5)
Eosinophils Relative: 0 %
HCT: 16.1 % — ABNORMAL LOW (ref 36.0–46.0)
Hemoglobin: 4.5 g/dL — CL (ref 12.0–15.0)
Immature Granulocytes: 1 %
Lymphocytes Relative: 8 %
Lymphs Abs: 0.9 10*3/uL (ref 0.7–4.0)
MCH: 23.1 pg — ABNORMAL LOW (ref 26.0–34.0)
MCHC: 28 g/dL — ABNORMAL LOW (ref 30.0–36.0)
MCV: 82.6 fL (ref 80.0–100.0)
Monocytes Absolute: 0.3 10*3/uL (ref 0.1–1.0)
Monocytes Relative: 3 %
Neutro Abs: 9.5 10*3/uL — ABNORMAL HIGH (ref 1.7–7.7)
Neutrophils Relative %: 88 %
Platelets: 243 10*3/uL (ref 150–400)
RBC: 1.95 MIL/uL — ABNORMAL LOW (ref 3.87–5.11)
RDW: 20.7 % — ABNORMAL HIGH (ref 11.5–15.5)
WBC: 10.8 10*3/uL — ABNORMAL HIGH (ref 4.0–10.5)
nRBC: 0.2 % (ref 0.0–0.2)

## 2022-05-25 LAB — PROTIME-INR
INR: 1.2 (ref 0.8–1.2)
Prothrombin Time: 14.9 seconds (ref 11.4–15.2)

## 2022-05-25 LAB — COMPREHENSIVE METABOLIC PANEL
ALT: 8 U/L (ref 0–44)
AST: 12 U/L — ABNORMAL LOW (ref 15–41)
Albumin: 3.1 g/dL — ABNORMAL LOW (ref 3.5–5.0)
Alkaline Phosphatase: 54 U/L (ref 38–126)
Anion gap: 9 (ref 5–15)
BUN: 15 mg/dL (ref 8–23)
CO2: 22 mmol/L (ref 22–32)
Calcium: 8.3 mg/dL — ABNORMAL LOW (ref 8.9–10.3)
Chloride: 107 mmol/L (ref 98–111)
Creatinine, Ser: 0.99 mg/dL (ref 0.44–1.00)
GFR, Estimated: >60 mL/min (ref >60)
Glucose, Bld: 121 mg/dL — ABNORMAL HIGH (ref 70–99)
Potassium: 3.5 mmol/L (ref 3.5–5.1)
Sodium: 138 mmol/L (ref 135–145)
Total Bilirubin: 0.4 mg/dL (ref 0.3–1.2)
Total Protein: 6.6 g/dL (ref 6.5–8.1)

## 2022-05-25 LAB — LIPASE, BLOOD: Lipase: 28 U/L (ref 11–51)

## 2022-05-25 LAB — APTT: aPTT: 32 seconds (ref 24–36)

## 2022-05-25 LAB — PREPARE RBC (CROSSMATCH)

## 2022-05-25 LAB — POC OCCULT BLOOD, ED: Fecal Occult Bld: POSITIVE — AB

## 2022-05-25 MED ORDER — IOHEXOL 350 MG/ML SOLN
100.0000 mL | Freq: Once | INTRAVENOUS | Status: AC | PRN
  Administered 2022-05-25: 100 mL via INTRAVENOUS

## 2022-05-25 MED ORDER — TRAZODONE HCL 50 MG PO TABS
50.0000 mg | ORAL_TABLET | Freq: Every evening | ORAL | Status: DC | PRN
  Administered 2022-05-26 – 2022-05-28 (×4): 50 mg via ORAL
  Filled 2022-05-25 (×4): qty 1

## 2022-05-25 MED ORDER — ACETAMINOPHEN 325 MG PO TABS
650.0000 mg | ORAL_TABLET | Freq: Four times a day (QID) | ORAL | Status: DC | PRN
  Administered 2022-05-27 – 2022-05-28 (×4): 650 mg via ORAL
  Filled 2022-05-25 (×4): qty 2

## 2022-05-25 MED ORDER — PROCHLORPERAZINE EDISYLATE 10 MG/2ML IJ SOLN: 5.0000 mg | Freq: Four times a day (QID) | INTRAMUSCULAR | Status: DC | PRN

## 2022-05-25 MED ORDER — ALPRAZOLAM 0.5 MG PO TABS
0.5000 mg | ORAL_TABLET | Freq: Every evening | ORAL | Status: DC | PRN
  Administered 2022-05-26 – 2022-05-28 (×4): 0.5 mg via ORAL
  Filled 2022-05-25 (×4): qty 1

## 2022-05-25 MED ORDER — SERTRALINE HCL 100 MG PO TABS
100.0000 mg | ORAL_TABLET | Freq: Every day | ORAL | Status: DC
  Administered 2022-05-26 – 2022-05-29 (×3): 100 mg via ORAL
  Filled 2022-05-25 (×4): qty 1

## 2022-05-25 MED ORDER — PANTOPRAZOLE INFUSION (NEW) - SIMPLE MED
8.0000 mg/h | INTRAVENOUS | Status: DC
  Administered 2022-05-25 – 2022-05-27 (×5): 8 mg/h via INTRAVENOUS
  Filled 2022-05-25 (×2): qty 100
  Filled 2022-05-25: qty 80
  Filled 2022-05-25: qty 100
  Filled 2022-05-25 (×4): qty 80
  Filled 2022-05-25: qty 100

## 2022-05-25 MED ORDER — GUAIFENESIN 100 MG/5ML PO LIQD
5.0000 mL | ORAL | Status: DC | PRN
  Administered 2022-05-26 – 2022-05-29 (×6): 5 mL via ORAL
  Filled 2022-05-25 (×6): qty 10

## 2022-05-25 MED ORDER — INSULIN ASPART 100 UNIT/ML IJ SOLN
0.0000 [IU] | INTRAMUSCULAR | Status: DC
  Administered 2022-05-27 (×2): 1 [IU] via SUBCUTANEOUS
  Administered 2022-05-28: 3 [IU] via SUBCUTANEOUS
  Administered 2022-05-28: 9 [IU] via SUBCUTANEOUS
  Administered 2022-05-28 – 2022-05-29 (×2): 1 [IU] via SUBCUTANEOUS
  Filled 2022-05-25: qty 0.09

## 2022-05-25 MED ORDER — IPRATROPIUM-ALBUTEROL 0.5-2.5 (3) MG/3ML IN SOLN
3.0000 mL | Freq: Four times a day (QID) | RESPIRATORY_TRACT | Status: DC
  Administered 2022-05-26 – 2022-05-27 (×7): 3 mL via RESPIRATORY_TRACT
  Filled 2022-05-25 (×7): qty 3

## 2022-05-25 MED ORDER — SODIUM CHLORIDE 0.9% IV SOLUTION: Freq: Once | INTRAVENOUS | Status: AC

## 2022-05-25 MED ORDER — PANTOPRAZOLE SODIUM 40 MG IV SOLR
40.0000 mg | Freq: Once | INTRAVENOUS | Status: AC
  Administered 2022-05-25: 40 mg via INTRAVENOUS
  Filled 2022-05-25: qty 10

## 2022-05-25 NOTE — ED Notes (Signed)
Pt states that she wants her labs to be drawn through port access

## 2022-05-25 NOTE — ED Notes (Signed)
Provider notified of critical hemoglobin 4.5.

## 2022-05-25 NOTE — H&P (Addendum)
History and Physical  Peggy House [id number] DOB: May 01, 1960 DOA: 05/25/2022  Referring physician: Elberta Leatherwood  PCP: Drucie Opitz, MD  Outpatient Specialists: Hematology oncology, GI, general surgery. Patient coming from: Home.  Chief Complaint: Bloody emesis and blood in the stool.  HPI: Peggy House is a 62 y.o. female with medical history significant for hypertension, hyperlipidemia, type 2 diabetes, pulmonary hypertension, iron deficiency anemia due to chronic blood loss on IV iron replacement, hereditary hemorrhagic telangiectasias, anemia secondary to chronic GI blood loss, who presented to [facility] ED from home due to hematemesis with onset this morning.  Endorses having 2 episodes of hematemesis earlier today as well as 1 bowel movement with blood in it.  Reports she ran out of her Tylenol and started using ibuprofen on Tuesday or Wednesday for minor aches and pain.  Had a cold with minor aches and generalized fatigue.  Denies abdominal pain.  Denies subjective fevers.  Admits to chills, states she stays cold all the time.  The patient presented to the ED for further evaluation.  In the ED, vital signs are stable.  COVID-19 screening test negative.  Labs notable for hemoglobin of 4.5K with positive FOBT.  EDP discussed the case with GI who recommended admission by hospitalist service.  Possible endoscopy in the morning.  N.p.o. after midnight.    No recurrent vomiting or bowel movement at the time of this visit, while in the ED.  Admits to feeling nauseated.  2 units PRBC ordered to be transfused by EDP.  Protonix drip initiated.  IV antiemetics in place.  CTA GI bleed results pending.  Chest x-ray nonacute.  The patient was admitted by [facility], hospitalist service.  ED Course: Tmax 97.7.  BP 137/78, pulse 77, respiratory 14, O2 saturation 100% on room air.  Lab studies remarkable for WBC 10.8, hemoglobin 4.5, platelet count 243.  Review of Systems: Review of systems  as noted in the HPI. All other systems reviewed and are negative.   Past Medical History:  Diagnosis Date   Anxiety    Arthritis    knnes,back   GERD (gastroesophageal reflux disease)    Hereditary hemorrhagic telangiectasia (HCC)    History of swelling of feet    Hyperlipidemia    Hypertension    Major depressive disorder, recurrent episode ([place]) 06/05/2015   Obesity    Snores    Type 2 diabetes mellitus with vascular disease ([place]) 02/26/2019   Past Surgical History:  Procedure Laterality Date   ABDOMINAL HYSTERECTOMY     CARPAL TUNNEL RELEASE  05/13/2011   Procedure: CARPAL TUNNEL RELEASE;  Surgeon: Nita Sells, MD;  Location: [place];  Service: Orthopedics;  Laterality: Left;   COLONOSCOPY N/A 03/02/2020   Procedure: COLONOSCOPY;  Surgeon: Ronald Lobo, MD;  Location: [facility] ENDOSCOPY;  Service: Endoscopy;  Laterality: N/A;   COLONOSCOPY WITH PROPOFOL N/A 04/28/2014   Procedure: COLONOSCOPY WITH PROPOFOL;  Surgeon: Cleotis Nipper, MD;  Location: [facility] ENDOSCOPY;  Service: Endoscopy;  Laterality: N/A;   DG TOES*L*  2/10   rt   DILATION AND CURETTAGE OF UTERUS     ENTEROSCOPY N/A 10/17/2017   Procedure: ENTEROSCOPY;  Surgeon: Otis Brace, MD;  Location: [place];  Service: Gastroenterology;  Laterality: N/A;   ESOPHAGOGASTRODUODENOSCOPY N/A 04/10/2014   Procedure: ESOPHAGOGASTRODUODENOSCOPY (EGD);  Surgeon: [name], MD;  Location: [facility] ENDOSCOPY;  Service: Endoscopy;  Laterality: N/A;   ESOPHAGOGASTRODUODENOSCOPY N/A 05/10/2017   Procedure: ESOPHAGOGASTRODUODENOSCOPY (EGD);  Surgeon: Ronald Lobo, MD;  Location:  [place] ENDOSCOPY;  Service: Endoscopy;  Laterality: N/A;   ESOPHAGOGASTRODUODENOSCOPY N/A 09/22/2017   Procedure: ESOPHAGOGASTRODUODENOSCOPY (EGD);  Surgeon: Clarene Essex, MD;  Location: [place];  Service: Endoscopy;  Laterality: N/A;  bedside   ESOPHAGOGASTRODUODENOSCOPY N/A 03/02/2020   Procedure: ESOPHAGOGASTRODUODENOSCOPY  (EGD);  Surgeon: Ronald Lobo, MD;  Location: [facility];  Service: Endoscopy;  Laterality: N/A;   ESOPHAGOGASTRODUODENOSCOPY N/A 11/03/2020   Procedure: ESOPHAGOGASTRODUODENOSCOPY (EGD);  Surgeon: Wilford Corner, MD;  Location: [place];  Service: Endoscopy;  Laterality: N/A;   ESOPHAGOGASTRODUODENOSCOPY N/A 04/12/2022   Procedure: ESOPHAGOGASTRODUODENOSCOPY (EGD);  Surgeon: Clarene Essex, MD;  Location: [facility];  Service: Gastroenterology;  Laterality: N/A;   ESOPHAGOGASTRODUODENOSCOPY (EGD) WITH PROPOFOL N/A 04/27/2014   Procedure: ESOPHAGOGASTRODUODENOSCOPY (EGD) WITH PROPOFOL;  Surgeon: Cleotis Nipper, MD;  Location: [place];  Service: Endoscopy;  Laterality: N/A;  possible apc   ESOPHAGOGASTRODUODENOSCOPY (EGD) WITH PROPOFOL N/A 09/30/2017   Procedure: ESOPHAGOGASTRODUODENOSCOPY (EGD) WITH PROPOFOL;  Surgeon: [name], MD;  Location: [place];  Service: Gastroenterology;  Laterality: N/A;   ESOPHAGOGASTRODUODENOSCOPY (EGD) WITH PROPOFOL N/A 10/01/2017   Procedure: ESOPHAGOGASTRODUODENOSCOPY (EGD) WITH PROPOFOL;  Surgeon: [name], MD;  Location: [place];  Service: Gastroenterology;  Laterality: N/A;   ESOPHAGOGASTRODUODENOSCOPY (EGD) WITH PROPOFOL N/A 10/08/2017   Procedure: ESOPHAGOGASTRODUODENOSCOPY (EGD) WITH PROPOFOL;  Surgeon: Otis Brace, MD;  Location: [place];  Service: Gastroenterology;  Laterality: N/A;   ESOPHAGOGASTRODUODENOSCOPY (EGD) WITH PROPOFOL N/A 10/17/2017   Procedure: ESOPHAGOGASTRODUODENOSCOPY (EGD) WITH PROPOFOL;  Surgeon: Otis Brace, MD;  Location: [place];  Service: Gastroenterology;  Laterality: N/A;   ESOPHAGOGASTRODUODENOSCOPY (EGD) WITH PROPOFOL N/A 10/19/2017   Procedure: ESOPHAGOGASTRODUODENOSCOPY (EGD) WITH PROPOFOL;  Surgeon: Otis Brace, MD;  Location: [place];  Service: Gastroenterology;  Laterality: N/A;   ESOPHAGOGASTRODUODENOSCOPY (EGD) WITH PROPOFOL N/A 12/04/2018   Procedure:  ESOPHAGOGASTRODUODENOSCOPY (EGD) WITH PROPOFOL;  Surgeon: Wilford Corner, MD;  Location[facility];  Service: Endoscopy;  Laterality: N/A;   GIVENS CAPSULE STUDY N/A 10/02/2017   Procedure: GIVENS CAPSULE STUDY;  Surgeon: [name], MD;  Location: [place];  Service: Gastroenterology;  Laterality: N/A;   GIVENS CAPSULE STUDY N/A 10/08/2017   Procedure: GIVENS CAPSULE STUDY;  Surgeon: Otis Brace, MD;  Location: [place];  Service: Gastroenterology;  Laterality: N/A;  endoscopic placement of capsule   GIVENS CAPSULE STUDY N/A 03/02/2020   Procedure: GIVENS CAPSULE STUDY;  Surgeon: Ronald Lobo, MD;  Location: [facility];  Service: Endoscopy;  Laterality: N/A;   HEMOSTASIS CLIP PLACEMENT  12/04/2018   Procedure: HEMOSTASIS CLIP PLACEMENT;  Surgeon: Wilford Corner, MD;  Location: [facility];  Service: Endoscopy;;   HOT HEMOSTASIS N/A 04/27/2014   Procedure: HOT HEMOSTASIS (ARGON PLASMA COAGULATION/BICAP);  Surgeon: Cleotis Nipper, MD;  Location: [facility] ENDOSCOPY;  Service: Endoscopy;  Laterality: N/A;   HOT HEMOSTASIS N/A 09/30/2017   Procedure: HOT HEMOSTASIS (ARGON PLASMA COAGULATION/BICAP);  Surgeon: [name], MD;  Location: [place];  Service: Gastroenterology;  Laterality: N/A;   HOT HEMOSTASIS N/A 10/01/2017   Procedure: HOT HEMOSTASIS (ARGON PLASMA COAGULATION/BICAP);  Surgeon: [name], MD;  Location: [place];  Service: Gastroenterology;  Laterality: N/A;   HOT HEMOSTASIS N/A 10/17/2017   Procedure: HOT HEMOSTASIS (ARGON PLASMA COAGULATION/BICAP);  Surgeon: Otis Brace, MD;  Location: [facility] ENDOSCOPY;  Service: Gastroenterology;  Laterality: N/A;   HOT HEMOSTASIS N/A 10/19/2017   Procedure: HOT HEMOSTASIS (ARGON PLASMA COAGULATION/BICAP);  Surgeon: Otis Brace, MD;  Location: [facility] ENDOSCOPY;  Service: Gastroenterology;  Laterality: N/A;   HOT HEMOSTASIS N/A 03/02/2020   Procedure: HOT HEMOSTASIS (ARGON PLASMA COAGULATION/BICAP);  Surgeon: Ronald Lobo, MD;  Location: [facility];  Service: Endoscopy;  Laterality: N/A;   HOT HEMOSTASIS N/A 04/12/2022   Procedure: HOT HEMOSTASIS (ARGON PLASMA COAGULATION/BICAP);  Surgeon: Clarene Essex, MD;  Location: [facility];  Service: Gastroenterology;  Laterality: N/A;   IR IMAGING GUIDED PORT INSERTION  07/08/2018   L shoulder Surgery  2011   POLYPECTOMY  03/02/2020   Procedure: POLYPECTOMY;  Surgeon: Ronald Lobo, MD;  Location: [facility];  Service: Endoscopy;;   SCLEROTHERAPY  11/03/2020   Procedure: Clide Deutscher;  Surgeon: Wilford Corner, MD;  Location: [facility] ENDOSCOPY;  Service: Endoscopy;;   SUBMUCOSAL INJECTION  09/22/2017   Procedure: SUBMUCOSAL INJECTION;  Surgeon: Clarene Essex, MD;  Location: [facility]OSCOPY;  Service: Endoscopy;;   SUBMUCOSAL INJECTION  12/04/2018   Procedure: SUBMUCOSAL INJECTION;  Surgeon: Wilford Corner, MD;  Location: [facility] ENDOS[facility];  Service: Endoscopy;;    Social History:  reports that she has been smoking cigarettes. She has a 15.00 pack-year smoking history. She quit smokeless tobacco use about 43 years ago.  Her smokeless tobacco use included snuff. She reports that she does not drink alcohol and does not use drugs.   Allergies  Allergen Reactions   Feraheme [Ferumoxytol] Other (See Comments)    Muscle tetany, encephalopathy, fatigue, nausea (reaction to injection)   Nsaids Other (See Comments)    Stomach bleeding episodes; Tylenol is OK   Tomato Hives   Iron (Ferrous Sulfate) [Ferrous Sulfate Er] Other (See Comments)    Shock   Wasp Venom Swelling    Family History  Problem Relation Age of Onset   Cancer Mother        Ovarian   Ovarian cancer Mother    Diabetes Mother    Kidney disease Mother    Bleeding Disorder Mother    Diabetes type II Sister    Bleeding Disorder Sister    Diabetes Sister    Colon cancer Maternal Grandfather    Crohn's disease Maternal Grandfather    Stomach cancer Maternal Grandmother    Kidney disease Son     Bleeding Disorder Son    Dysmenorrhea Neg Hx       Prior to Admission medications   Medication Sig Start Date End Date Taking? Authorizing Provider  Accu-Chek Softclix Lancets lancets Use to check blood sugar before breakfast and before dinner while on steroids Patient taking differently: 1 each by Other route See admin instructions. Use to check blood sugar before breakfast and before dinner while on steroids 09/27/19   [name], MD  ALPRAZolam Duanne Moron) 0.5 MG tablet Take 1 tablet (0.5 mg total) by mouth at bedtime as needed for anxiety or sleep. Patient taking differently: Take 0.5 mg by mouth at bedtime. 03/05/22   Nooruddin, Marlene Lard, MD  Aminocaproic Acid 1000 MG TABS TAKE 1 TABLET(1000 MG) BY MOUTH IN THE MORNING AND AT BEDTIME Patient taking differently: Take 1,000 mg by mouth in the morning and at bedtime. 02/04/22   Truitt Merle, MD  amLODipine (NORVASC) 10 MG tablet Take 1 tablet (10 mg total) by mouth daily. 06/22/21   [name], MD  furosemide (LASIX) 40 MG tablet Take 40 mg by mouth daily.    [provider]  glucose blood (ACCU-CHEK GUIDE) test strip Check blood sugar 2 times per day while on steroids before breakfast and dinner Patient taking differently: 1 each by Other route See admin instructions. Check blood sugar 2 times per day while on steroids before breakfast and dinner 09/27/19   [name],  MD  lidocaine-prilocaine (EMLA) cream Apply 1 application topically as needed. Patient taking differently: Apply 1 application  topically as needed (for port access). 07/17/18   Truitt Merle, MD  metFORMIN (GLUCOPHAGE) 500 MG tablet TAKE 1 TABLET(500 MG) BY MOUTH TWICE DAILY WITH A MEAL Patient taking differently: Take 500 mg by mouth 2 (two) times daily with a meal. 11/21/21   Farrel Gordon, DO  ondansetron (ZOFRAN) 4 MG tablet Take 1 tablet (4 mg total) by mouth every 8 (eight) hours as needed for nausea or vomiting. 04/02/22   Truitt Merle, MD  pantoprazole (PROTONIX) 40  MG tablet TAKE 1 TABLET(40 MG) BY MOUTH TWICE DAILY Patient taking differently: Take 40 mg by mouth 2 (two) times daily before a meal. 01/11/22   Nooruddin, Marlene Lard, MD  Podiatric Products (FLEXITOL HEEL BALM) OINT Apply 1 application  topically as needed (to the feet- for dry skin). 06/10/19   [provider]  PROAIR HFA 108 (90 Base) MCG/ACT inhaler INHALE 1 TO 2 PUFFS INTO THE LUNGS EVERY 6 HOURS AS NEEDED FOR WHEEZING OR SHORTNESS OF BREATH Patient taking differently: Inhale 1-2 puffs into the lungs every 6 (six) hours as needed for wheezing or shortness of breath. 11/10/20   Truitt Merle, MD  sertraline (ZOLOFT) 100 MG tablet Take 1 tablet (100 mg total) by mouth daily. 11/21/21   Farrel Gordon, DO  terbinafine (LAMISIL AT) 1 % cream Apply 1 application topically 2 (two) times daily. Both bottom and top of both feet and toes Patient taking differently: Apply 1 application  topically 2 (two) times daily as needed (for affected areas of both feet and toes- bottoms and tops). 06/16/20   Seawell, Jaimie A, DO  traZODone (DESYREL) 100 MG tablet TAKE 1 TABLET(100 MG) BY MOUTH AT BEDTIME Patient taking differently: Take 100 mg by mouth at bedtime. 12/03/21   Farrel Gordon, DO  TYLENOL PM EXTRA STRENGTH 500-25 MG TABS tablet Take 2 tablets by mouth at bedtime as needed (for sleep).    [provider]    Physical Exam: BP (!) 134/104   Pulse 73   Temp 97.7 F (36.5 C) (Oral)   Resp 19   Ht 5' 5"$  (1.651 m)   Wt 102.6 kg   SpO2 100%   BMI 37.64 kg/m   General: 62 y.o. year-old female well developed well nourished in no acute distress.  Alert and oriented x3. Cardiovascular: Regular rate and rhythm with no rubs or gallops.  No thyromegaly or JVD noted.  No lower extremity edema. 2/4 pulses in all 4 extremities. Respiratory: Clear to auscultation with no wheezes or rales. Good inspiratory effort. Abdomen: Soft nontender nondistended with normal bowel sounds x4 quadrants. Muskuloskeletal: No  cyanosis, clubbing or edema noted bilaterally Neuro: CN II-XII intact, strength, sensation, reflexes Skin: No ulcerative lesions noted or rashes Psychiatry: Judgement and insight appear normal. Mood is appropriate for condition and setting          Labs on Admission:  Basic Metabolic Panel: Recent Labs  Lab 05/25/22 1936  NA 138  K 3.5  CL 107  CO2 22  GLUCOSE 121*  BUN 15  CREATININE 0.99  CALCIUM 8.3*   Liver Function Tests: Recent Labs  Lab 05/25/22 1936  AST 12*  ALT 8  ALKPHOS 54  BILITOT 0.4  PROT 6.6  ALBUMIN 3.1*   Recent Labs  Lab 05/25/22 1936  LIPASE 28   No results for input(s): "AMMONIA" in the last 168 hours. CBC: Recent Labs  Lab  05/25/22 1936  WBC 10.8*  NEUTROABS 9.5*  HGB 4.5*  HCT 16.1*  MCV 82.6  PLT 243   Cardiac Enzymes: No results for input(s): "CKTOTAL", "CKMB", "CKMBINDEX", "TROPONINI" in the last 168 hours.  BNP (last 3 results) No results for input(s): "BNP" in the last 8760 hours.  ProBNP (last 3 results) No results for input(s): "PROBNP" in the last 8760 hours.  CBG: No results for input(s): "GLUCAP" in the last 168 hours.  Radiological Exams on Admission: DG Chest Portable 1 View  Result Date: 05/25/2022 CLINICAL DATA:  Weakness, nausea/vomiting, cough EXAM: PORTABLE CHEST 1 VIEW COMPARISON:  Chest x-ray September 14, 2020 FINDINGS: Right chest wall port catheter in place. The cardiomediastinal silhouette is unchanged in contour, including mild cardiomegaly. No focal pulmonary opacity. No pleural effusion or pneumothorax. Partially visualized coil material projecting over the left upper abdomen. No acute osseous abnormality. IMPRESSION: 1. No acute pulmonary abnormality. 2. Unchanged cardiomegaly. Electronically Signed   By: Beryle Flock M.D.   On: 05/25/2022 19:21    EKG: I independently viewed the EKG done and my findings are as followed: Sinus rhythm rate of 67.  Nonspecific ST-T changes.  QTc  510.  Assessment/Plan Present on Admission:  GI bleed  Principal Problem:   GI bleed  GI bleed, hematemesis History of AVMs, hereditary hemorrhagic telangiectasia Presented with hemoglobin of 4.5K after having 2 episodes of hematemesis and a bowel movement with blood in it, at home. 2 units PRBC ordered to be transfused by EDP Protonix drip initiated in the ED, continue. Serial H&H every 6 hours Maintain hemoglobin greater than 8.0. Maintain MAP greater than 65 Advised to avoid NSAIDs, she was receptive.  Acute on chronic blood loss anemia Secondary to GI bleed Management as stated above  Mild leukocytosis Suspect reactive Rule out active infective process Chest x-ray nonacute UA pending, follow results.  Prolonged QTc Admission twelve-lead EKG showing QTc 510 Avoid QTc prolonging agents Optimize magnesium and potassium levels Closely monitor with telemetry.  Obesity BMI 37 Recommend weight loss outpatient with regular physical activity and healthy dieting.  Chronic anxiety/depression Stable Resume home regimen.  Type 2 diabetes Hold off home oral hypoglycemics Obtain hemoglobin A1c Clear liquid diet now, n.p.o. after midnight. Insulin sliding scale    Critical care time: 65 minutes.    DVT prophylaxis: SCDs  Code Status: Full code  Family Communication: Updated her son Vonna Kotyk on the phone.  Disposition Plan: Admitted to stepdown unit.  Consults called: GI consulted by EDP.  Admission status: Inpatient status.   Status is: Inpatient The patient requires at least 2 midnights for further evaluation and treatment of present condition.   Kayleen Memos MD [facility] Pager [phone]  If 7PM-7AM, please contact night-coverage [email or web address] Password [facility]  05/25/2022, 10:37 PM

## 2022-05-25 NOTE — ED Notes (Signed)
Patient transported to CT 

## 2022-05-25 NOTE — ED Triage Notes (Addendum)
From home via [facility] for N/V, cough, chills, malaise for 2 days. Pt states that she vomited blood today, has hx of anemia 104/50 BP 72 HR 20 RR 100% r/a 120 cbg

## 2022-05-25 NOTE — ED Provider Notes (Signed)
 [place] EMERGENCY DEPARTMENT AT [facility] Provider Note   CSN: [id number] Arrival date & time: 05/25/22  1826     History Chief Complaint  Patient presents with   Cough   Hematemesis    Peggy House is a 62 y.o. female.  Past medical history significant for hypertension, hyperlipidemia, pulmonary hypertension, hereditary hemorrhagic telangiectasia, history of GI bleed who presents with hematemesis.  Patient reports that she had 2 episodes of hematemesis earlier today as well as 1 bowel movement which she believes may be had blood in it as it was particularly dark.  Patient denies any abdominal discomfort at this time.  Patient denies use of any anticoagulants, aspirin or NSAIDs.  Patient also denies any alcohol or chronic steroid use.  Otherwise denies fever, shortness of breath, chest pain, nausea.    Home Medications Prior to Admission medications   Medication Sig Start Date End Date Taking? Authorizing Provider  Accu-Chek Softclix Lancets lancets Use to check blood sugar before breakfast and before dinner while on steroids Patient taking differently: 1 each by Other route See admin instructions. Use to check blood sugar before breakfast and before dinner while on steroids 09/27/19   Claudean Severance, MD  ALPRAZolam Prudy Feeler) 0.5 MG tablet Take 1 tablet (0.5 mg total) by mouth at bedtime as needed for anxiety or sleep. Patient taking differently: Take 0.5 mg by mouth at bedtime. 03/05/22   Nooruddin, Jason Fila, MD  Aminocaproic Acid 1000 MG TABS TAKE 1 TABLET(1000 MG) BY MOUTH IN THE MORNING AND AT BEDTIME Patient taking differently: Take 1,000 mg by mouth in the morning and at bedtime. 02/04/22   Malachy Mood, MD  amLODipine (NORVASC) 10 MG tablet Take 1 tablet (10 mg total) by mouth daily. 06/22/21   Ellison Carwin, MD  furosemide (LASIX) 40 MG tablet Take 40 mg by mouth daily.    [provider]  glucose blood (ACCU-CHEK GUIDE) test strip Check blood sugar 2  times per day while on steroids before breakfast and dinner Patient taking differently: 1 each by Other route See admin instructions. Check blood sugar 2 times per day while on steroids before breakfast and dinner 09/27/19   Claudean Severance, MD  lidocaine-prilocaine (EMLA) cream Apply 1 application topically as needed. Patient taking differently: Apply 1 application  topically as needed (for port access). 07/17/18   Malachy Mood, MD  metFORMIN (GLUCOPHAGE) 500 MG tablet TAKE 1 TABLET(500 MG) BY MOUTH TWICE DAILY WITH A MEAL Patient taking differently: Take 500 mg by mouth 2 (two) times daily with a meal. 11/21/21   Champ Mungo, DO  ondansetron (ZOFRAN) 4 MG tablet Take 1 tablet (4 mg total) by mouth every 8 (eight) hours as needed for nausea or vomiting. 04/02/22   Malachy Mood, MD  pantoprazole (PROTONIX) 40 MG tablet TAKE 1 TABLET(40 MG) BY MOUTH TWICE DAILY Patient taking differently: Take 40 mg by mouth 2 (two) times daily before a meal. 01/11/22   Nooruddin, Jason Fila, MD  Podiatric Products (FLEXITOL HEEL BALM) OINT Apply 1 application  topically as needed (to the feet- for dry skin). 06/10/19   [provider]  PROAIR HFA 108 (90 Base) MCG/ACT inhaler INHALE 1 TO 2 PUFFS INTO THE LUNGS EVERY 6 HOURS AS NEEDED FOR WHEEZING OR SHORTNESS OF BREATH Patient taking differently: Inhale 1-2 puffs into the lungs every 6 (six) hours as needed for wheezing or shortness of breath. 11/10/20   Malachy Mood, MD  sertraline (ZOLOFT) 100 MG tablet Take 1 tablet (  100 mg total) by mouth daily. 11/21/21   Champ Mungo, DO  terbinafine (LAMISIL AT) 1 % cream Apply 1 application topically 2 (two) times daily. Both bottom and top of both feet and toes Patient taking differently: Apply 1 application  topically 2 (two) times daily as needed (for affected areas of both feet and toes- bottoms and tops). 06/16/20   Seawell, Jaimie A, DO  traZODone (DESYREL) 100 MG tablet TAKE 1 TABLET(100 MG) BY MOUTH AT BEDTIME Patient taking  differently: Take 100 mg by mouth at bedtime. 12/03/21   Champ Mungo, DO  TYLENOL PM EXTRA STRENGTH 500-25 MG TABS tablet Take 2 tablets by mouth at bedtime as needed (for sleep).    [provider]      Allergies    Feraheme [ferumoxytol], Nsaids, Tomato, Iron (ferrous sulfate) [ferrous sulfate er], and Wasp venom    Review of Systems   Review of Systems  Constitutional:  Positive for fatigue. Negative for chills and fever.  Respiratory:  Positive for cough. Negative for shortness of breath.   Cardiovascular:  Negative for chest pain.  Gastrointestinal:  Positive for blood in stool and vomiting. Negative for abdominal pain.  Genitourinary:  Negative for dysuria.  Neurological:  Negative for headaches.  All other systems reviewed and are negative.   Physical Exam Updated Vital Signs BP (!) 134/104   Pulse 73   Temp 97.7 F (36.5 C) (Oral)   Resp 19   Ht 5\' 5"  (1.651 m)   Wt 102.6 kg   SpO2 100%   BMI 37.64 kg/m  Physical Exam Vitals and nursing note reviewed.  Constitutional:      Appearance: She is ill-appearing.  HENT:     Head: Normocephalic and atraumatic.  Cardiovascular:     Rate and Rhythm: Normal rate and regular rhythm.     Pulses: Normal pulses.     Heart sounds: Normal heart sounds.  Pulmonary:     Effort: Pulmonary effort is normal.     Breath sounds: Normal breath sounds.  Abdominal:     General: Abdomen is flat. Bowel sounds are normal.  Genitourinary:    Rectum: Guaiac result positive.  Skin:    General: Skin is warm and dry.     Capillary Refill: Capillary refill takes less than 2 seconds.  Neurological:     General: No focal deficit present.     Mental Status: She is alert and oriented to person, place, and time.     ED Results / Procedures / Treatments   Labs (all labs ordered are listed, but only abnormal results are displayed) Labs Reviewed  COMPREHENSIVE METABOLIC PANEL - Abnormal; Notable for the following components:       Result Value   Glucose, Bld 121 (*)    Calcium 8.3 (*)    Albumin 3.1 (*)    AST 12 (*)    All other components within normal limits  CBC WITH DIFFERENTIAL/PLATELET - Abnormal; Notable for the following components:   WBC 10.8 (*)    RBC 1.95 (*)    Hemoglobin 4.5 (*)    HCT 16.1 (*)    MCH 23.1 (*)    MCHC 28.0 (*)    RDW 20.7 (*)    Neutro Abs 9.5 (*)    All other components within normal limits  POC OCCULT BLOOD, ED - Abnormal; Notable for the following components:   Fecal Occult Bld POSITIVE (*)    All other components within normal limits  RESP PANEL BY  RT-PCR (RSV, FLU A&B, COVID)  RVPGX2  LIPASE, BLOOD  PROTIME-INR  APTT  HEMOGLOBIN AND HEMATOCRIT, BLOOD  URINALYSIS, ROUTINE W REFLEX MICROSCOPIC  HEMOGLOBIN A1C  HEMOGLOBIN AND HEMATOCRIT, BLOOD  CBC WITH DIFFERENTIAL/PLATELET  COMPREHENSIVE METABOLIC PANEL  MAGNESIUM  PHOSPHORUS  PREPARE RBC (CROSSMATCH)  TYPE AND SCREEN    EKG EKG Interpretation  Date/Time:  Saturday May 25 2022 18:52:57 EST Ventricular Rate:  67 PR Interval:  179 QRS Duration: 90 QT Interval:  483 QTC Calculation: 510 R Axis:   -13 Text Interpretation: Sinus rhythm Borderline T abnormalities, anterior leads Prolonged QT interval since last tracing no significant change Confirmed by Rolan Bucco [phone]) on 05/25/2022 10:12:20 PM  Radiology CT ANGIO GI BLEED  Result Date: 05/25/2022 CLINICAL DATA:  Vomiting blood. EXAM: CTA ABDOMEN AND PELVIS WITHOUT AND WITH CONTRAST TECHNIQUE: Multidetector CT imaging of the abdomen and pelvis was performed using the standard protocol during bolus administration of intravenous contrast. Multiplanar reconstructed images and MIPs were obtained and reviewed to evaluate the vascular anatomy. RADIATION DOSE REDUCTION: This exam was performed according to the departmental dose-optimization program which includes automated exposure control, adjustment of the mA and/or kV according to patient size and/or use of  iterative reconstruction technique. CONTRAST:  OMNIPAQUE IOHEXOL 350 MG/ML SOLN COMPARISON:  CTA GI bleed 04/11/2022 FINDINGS: VASCULAR Aorta: Normal caliber aorta without aneurysm, dissection, vasculitis or significant stenosis. There are mild calcified atherosclerotic plaque throughout the aorta. Celiac: Patent without evidence of aneurysm, dissection, vasculitis or significant stenosis. SMA: Patent without evidence of aneurysm, dissection, vasculitis or significant stenosis. Renals: Both renal arteries are patent without evidence of aneurysm, dissection, vasculitis, fibromuscular dysplasia or significant stenosis. IMA: Patent without evidence of aneurysm, dissection, vasculitis or significant stenosis. Inflow: Patent without evidence of aneurysm, dissection, vasculitis or significant stenosis. There is calcified atherosclerotic disease present. Proximal Outflow: Bilateral common femoral and visualized portions of the superficial and profunda femoral arteries are patent without evidence of aneurysm, dissection, vasculitis or significant stenosis. Veins: No obvious venous abnormality within the limitations of this arterial phase study. Review of the MIP images confirms the above findings. NON-VASCULAR Lower chest: No acute abnormality. Hepatobiliary: Gallstones are present. There is no biliary ductal dilatation. No focal liver lesions are seen. Pancreas: Unremarkable. No pancreatic ductal dilatation or surrounding inflammatory changes. Spleen: Normal in size without focal abnormality. Adrenals/Urinary Tract: There is a 17 mm cyst in the right kidney. Otherwise, the adrenal glands, kidneys and bladder are within normal limits. Stomach/Bowel: Stomach is within normal limits. Appendix appears normal. No evidence of bowel wall thickening, distention, or inflammatory changes. No active gastrointestinal bleeding identified. There surgical clips adjacent to the stomach, unchanged. Lymphatic: No enlarged lymph nodes  are seen. Reproductive: Status post hysterectomy. No adnexal masses. Other: Scarring and calcifications are seen in the lower anterior abdominal wall, similar to prior. There is a small fat containing umbilical hernia. There is no ascites. Musculoskeletal: Minimal compression deformity of L4 is unchanged. No acute fractures are identified. IMPRESSION: 1. No evidence for active gastrointestinal bleeding. 2. No acute vascular pathology in the abdomen or pelvis. Aortic Atherosclerosis (ICD10-I70.0). NON-VASCULAR 1. Cholelithiasis. 2. Right Bosniak I benign renal cyst measuring 1.7 cm. No follow-up imaging is recommended. JACR 2018 Feb; 264-273, Management of the Incidental Renal Mass on CT, [employer] 2021; [id number], Bosniak Classification of Cystic Renal Masses, Version 2019. Electronically Signed   By: Darliss Cheney M.D.   On: 05/25/2022 22:59   DG Chest Portable 1 View  Result Date: 05/25/2022 CLINICAL  DATA:  Weakness, nausea/vomiting, cough EXAM: PORTABLE CHEST 1 VIEW COMPARISON:  Chest x-ray September 14, 2020 FINDINGS: Right chest wall port catheter in place. The cardiomediastinal silhouette is unchanged in contour, including mild cardiomegaly. No focal pulmonary opacity. No pleural effusion or pneumothorax. Partially visualized coil material projecting over the left upper abdomen. No acute osseous abnormality. IMPRESSION: 1. No acute pulmonary abnormality. 2. Unchanged cardiomegaly. Electronically Signed   By: Jacob Moores M.D.   On: 05/25/2022 19:21    Procedures .Critical Care  Performed by: Smitty Knudsen, PA-C Authorized by: Smitty Knudsen, PA-C   Critical care provider statement:    Critical care time (minutes):  30   Critical care start time:  05/25/2022 9:30 PM   Critical care end time:  05/25/2022 10:00 PM   Critical care time was exclusive of:  Separately billable procedures and treating other patients   Critical care was necessary to treat or prevent imminent or life-threatening  deterioration of the following conditions:  Circulatory failure   Critical care was time spent personally by me on the following activities:  Development of treatment plan with patient or surrogate, discussions with consultants, evaluation of patient's response to treatment, examination of patient, ordering and review of laboratory studies, ordering and review of radiographic studies, ordering and performing treatments and interventions, pulse oximetry, re-evaluation of patient's condition and review of old charts   I assumed direction of critical care for this patient from another provider in my specialty: yes     Care discussed with: admitting provider      Medications Ordered in ED Medications  0.9 %  sodium chloride infusion (Manually program via Guardrails IV Fluids) (has no administration in time range)  pantoprozole (PROTONIX) 80 mg /NS 100 mL infusion (8 mg/hr Intravenous New Bag/Given 05/25/22 2257)  prochlorperazine (COMPAZINE) injection 5 mg (has no administration in time range)  ipratropium-albuterol (DUONEB) 0.5-2.5 (3) MG/3ML nebulizer solution 3 mL (has no administration in time range)  guaiFENesin (ROBITUSSIN) 100 MG/5ML liquid 5 mL (has no administration in time range)  ALPRAZolam (XANAX) tablet 0.5 mg (has no administration in time range)  sertraline (ZOLOFT) tablet 100 mg (has no administration in time range)  traZODone (DESYREL) tablet 50 mg (has no administration in time range)  acetaminophen (TYLENOL) tablet 650 mg (has no administration in time range)  insulin aspart (novoLOG) injection 0-9 Units (has no administration in time range)  pantoprazole (PROTONIX) injection 40 mg (40 mg Intravenous Given 05/25/22 1941)  iohexol (OMNIPAQUE) 350 MG/ML injection 100 mL (100 mLs Intravenous Contrast Given 05/25/22 2221)    ED Course/ Medical Decision Making/ A&P                           Medical Decision Making Amount and/or Complexity of Data Reviewed Labs: ordered. Radiology:  ordered.  Risk Prescription drug management. Decision regarding hospitalization.   This patient presents to the ED for concern of hematemesis, this involves an extensive number of treatment options, and is a complaint that carries with it a high risk of complications and morbidity.  The differential diagnosis includes upper GI bleed, esophageal varices, Boerhaave syndrome, gastroenteritis   Co morbidities that complicate the patient evaluation  Hypertension, hyperlipidemia, history of GI bleed   Lab Tests:  I Ordered, and personally interpreted labs.  The pertinent results include: Labs significant for newly decreased hemoglobin of 4.5.  Stool Hemoccult positive.  BUN normal at 15 creatinine without elevation of 0.99.  Imaging Studies ordered:  I ordered imaging studies including chest x-ray, CT angio GI I independently visualized and interpreted imaging which showed no acute cardiopulmonary disease. I agree with the radiologist interpretation   Cardiac Monitoring: / EKG:  The patient was maintained on a cardiac monitor.  I personally viewed and interpreted the cardiac monitored which showed an underlying rhythm of: Sinus rhythm   Consultations Obtained:  I requested consultation with the gastroenterologist and hospitalist,  and discussed lab and imaging findings as well as pertinent plan - they recommend: Dr. Bosie Clos, GI, recommends evaluation and scope by GI in the morning. Dr. Margo Aye, hospitalist, accepts patient for admission and monitoring until GI is able to assess.   Problem List / ED Course / Critical interventions / Medication management  Hematemesis Patient presented to the ED following two episodes of hematemesis this morning along with dark stool. Stated that hematemesis was bright red blood. Not currently on thinners, aspirin, NSAIDs, or using alcohol. Patient presented with similar symptoms about 1.5 months ago and required admission for blood transfusion as well  as EGD by GI. Advised patient that given her decrease in hemoglobin, she would required admission for further evaluation and management. Patient was agreeable with this plan. During the duration that I evaluated patient, she remained hemodynamically stable without acute changes in HR, blood pressure, or saturation. Discussed patient condition with Dr. Bosie Clos, GI, and Dr. Margo Aye, hospitalist, who were agreeable that patient would benefit from admission for monitoring and further evaluation. Dr. Margo Aye agreed that have patient admitted with pending GI evaluation in the morning for likely repeat EGD. Informed patient of this plan and she was agreeable. Patient did not have any further questions for me prior to Dr. Margo Aye admitting patient.     I ordered medication including pantoprazole, fluids, packed red blood cells for suspected GI bleed I have reviewed the patients home medicines and have made adjustments as needed   Test / Admission - Considered:  Will consult with gastroenterology and hospitalist for inpatient admission for suspected GI bleed.  Patient was recently admitted on 04/11/2022 for similar complaints.  Patient reports that she has been not been able to follow-up with her gastroenterologist recently for symptoms. Spoke with Dr. Bosie Clos, GI, and they will plan on assessing patient in the morning for evaluation and likely scope. Spoke with Dr. Margo Aye, hospitalist who agreed to admit patient for management for GI to evaluate in the morning.   Final Clinical Impression(s) / ED Diagnoses Final diagnoses:  Hematemesis with nausea  Acute cough    Rx / DC Orders ED Discharge Orders     None         Smitty Knudsen, PA-C 05/26/22 0000    Rolan Bucco, MD 05/26/22 1256

## 2022-05-26 ENCOUNTER — Encounter (HOSPITAL_COMMUNITY): Payer: Self-pay | Admitting: Internal Medicine

## 2022-05-26 DIAGNOSIS — Z6837 Body mass index (BMI) 37.0-37.9, adult: Secondary | ICD-10-CM | POA: Diagnosis not present

## 2022-05-26 DIAGNOSIS — E669 Obesity, unspecified: Secondary | ICD-10-CM | POA: Diagnosis not present

## 2022-05-26 DIAGNOSIS — D72829 Elevated white blood cell count, unspecified: Secondary | ICD-10-CM | POA: Diagnosis not present

## 2022-05-26 DIAGNOSIS — E119 Type 2 diabetes mellitus without complications: Secondary | ICD-10-CM | POA: Diagnosis not present

## 2022-05-26 DIAGNOSIS — F32A Depression, unspecified: Secondary | ICD-10-CM | POA: Diagnosis not present

## 2022-05-26 DIAGNOSIS — D649 Anemia, unspecified: Secondary | ICD-10-CM | POA: Diagnosis not present

## 2022-05-26 DIAGNOSIS — Z1152 Encounter for screening for COVID-19: Secondary | ICD-10-CM | POA: Diagnosis not present

## 2022-05-26 DIAGNOSIS — K31811 Angiodysplasia of stomach and duodenum with bleeding: Secondary | ICD-10-CM | POA: Diagnosis not present

## 2022-05-26 DIAGNOSIS — E785 Hyperlipidemia, unspecified: Secondary | ICD-10-CM | POA: Diagnosis not present

## 2022-05-26 DIAGNOSIS — I10 Essential (primary) hypertension: Secondary | ICD-10-CM | POA: Diagnosis not present

## 2022-05-26 DIAGNOSIS — F17210 Nicotine dependence, cigarettes, uncomplicated: Secondary | ICD-10-CM | POA: Diagnosis not present

## 2022-05-26 DIAGNOSIS — K920 Hematemesis: Secondary | ICD-10-CM | POA: Diagnosis not present

## 2022-05-26 DIAGNOSIS — I272 Pulmonary hypertension, unspecified: Secondary | ICD-10-CM | POA: Diagnosis not present

## 2022-05-26 DIAGNOSIS — D62 Acute posthemorrhagic anemia: Secondary | ICD-10-CM | POA: Diagnosis not present

## 2022-05-26 LAB — CBC WITH DIFFERENTIAL/PLATELET
Abs Immature Granulocytes: 0.03 10*3/uL (ref 0.00–0.07)
Basophils Absolute: 0 10*3/uL (ref 0.0–0.1)
Basophils Relative: 0 %
Eosinophils Absolute: 0.1 10*3/uL (ref 0.0–0.5)
Eosinophils Relative: 1 %
HCT: 19.9 % — ABNORMAL LOW (ref 36.0–46.0)
Hemoglobin: 6.1 g/dL — CL (ref 12.0–15.0)
Immature Granulocytes: 0 %
Lymphocytes Relative: 13 %
Lymphs Abs: 1.5 10*3/uL (ref 0.7–4.0)
MCH: 25.2 pg — ABNORMAL LOW (ref 26.0–34.0)
MCHC: 30.7 g/dL (ref 30.0–36.0)
MCV: 82.2 fL (ref 80.0–100.0)
Monocytes Absolute: 0.5 10*3/uL (ref 0.1–1.0)
Monocytes Relative: 5 %
Neutro Abs: 8.9 10*3/uL — ABNORMAL HIGH (ref 1.7–7.7)
Neutrophils Relative %: 81 %
Platelets: 252 10*3/uL (ref 150–400)
RBC: 2.42 MIL/uL — ABNORMAL LOW (ref 3.87–5.11)
RDW: 17.9 % — ABNORMAL HIGH (ref 11.5–15.5)
WBC: 11 10*3/uL — ABNORMAL HIGH (ref 4.0–10.5)
nRBC: 0.3 % — ABNORMAL HIGH (ref 0.0–0.2)

## 2022-05-26 LAB — COMPREHENSIVE METABOLIC PANEL
ALT: 7 U/L (ref 0–44)
AST: 13 U/L — ABNORMAL LOW (ref 15–41)
Albumin: 2.9 g/dL — ABNORMAL LOW (ref 3.5–5.0)
Alkaline Phosphatase: 51 U/L (ref 38–126)
Anion gap: 9 (ref 5–15)
BUN: 13 mg/dL (ref 8–23)
CO2: 23 mmol/L (ref 22–32)
Calcium: 8.2 mg/dL — ABNORMAL LOW (ref 8.9–10.3)
Chloride: 110 mmol/L (ref 98–111)
Creatinine, Ser: 0.85 mg/dL (ref 0.44–1.00)
GFR, Estimated: >60 mL/min (ref >60)
Glucose, Bld: 95 mg/dL (ref 70–99)
Potassium: 3.4 mmol/L — ABNORMAL LOW (ref 3.5–5.1)
Sodium: 142 mmol/L (ref 135–145)
Total Bilirubin: 0.7 mg/dL (ref 0.3–1.2)
Total Protein: 6.2 g/dL — ABNORMAL LOW (ref 6.5–8.1)

## 2022-05-26 LAB — GLUCOSE, CAPILLARY
Glucose-Capillary: 118 mg/dL — ABNORMAL HIGH (ref 70–99)
Glucose-Capillary: 90 mg/dL (ref 70–99)
Glucose-Capillary: 117 mg/dL — ABNORMAL HIGH (ref 70–99)
Glucose-Capillary: 88 mg/dL (ref 70–99)
Glucose-Capillary: 111 mg/dL — ABNORMAL HIGH (ref 70–99)
Glucose-Capillary: 94 mg/dL (ref 70–99)
Glucose-Capillary: 116 mg/dL — ABNORMAL HIGH (ref 70–99)

## 2022-05-26 LAB — HEMOGLOBIN AND HEMATOCRIT, BLOOD
HCT: 22.1 % — ABNORMAL LOW (ref 36.0–46.0)
HCT: 22 % — ABNORMAL LOW (ref 36.0–46.0)
Hemoglobin: 7 g/dL — ABNORMAL LOW (ref 12.0–15.0)
Hemoglobin: 7 g/dL — ABNORMAL LOW (ref 12.0–15.0)

## 2022-05-26 LAB — MRSA NEXT GEN BY PCR, NASAL: MRSA by PCR Next Gen: NOT DETECTED

## 2022-05-26 LAB — MAGNESIUM: Magnesium: 2.1 mg/dL (ref 1.7–2.4)

## 2022-05-26 LAB — PHOSPHORUS: Phosphorus: 3.4 mg/dL (ref 2.5–4.6)

## 2022-05-26 LAB — PREPARE RBC (CROSSMATCH)

## 2022-05-26 MED ORDER — POTASSIUM CHLORIDE 10 MEQ/100ML IV SOLN
10.0000 meq | INTRAVENOUS | Status: AC
  Administered 2022-05-26 (×3): 10 meq via INTRAVENOUS
  Filled 2022-05-26 (×3): qty 100

## 2022-05-26 MED ORDER — SODIUM CHLORIDE 0.9% IV SOLUTION: Freq: Once | INTRAVENOUS | Status: AC

## 2022-05-26 MED ORDER — ORAL CARE MOUTH RINSE: 15.0000 mL | OROMUCOSAL | Status: DC | PRN

## 2022-05-26 MED ORDER — CHLORHEXIDINE GLUCONATE CLOTH 2 % EX PADS
6.0000 | MEDICATED_PAD | Freq: Every day | CUTANEOUS | Status: DC
  Administered 2022-05-28: 6 via TOPICAL

## 2022-05-26 NOTE — ED Notes (Signed)
ED TO INPATIENT HANDOFF REPORT  ED Nurse Name and Phone #:  Corky Sing RN   S Name/Age/Gender Peggy [place] 62 y.o. female Room/Bed: WA11/WA11  Code Status   Code Status: Full Code  Home/SNF/Other Home Patient oriented to: self, place, time, and situation Is this baseline? Yes   Triage Complete: Triage complete  Chief Complaint GI bleed [K92.2]  Triage Note From home via [facility] for N/V, cough, chills, malaise for 2 days. Pt states that she vomited blood today, has hx of anemia 104/50 BP 72 HR 20 RR 100% r/a 120 cbg   Allergies Allergies  Allergen Reactions   Feraheme [Ferumoxytol] Other (See Comments)    Muscle tetany, encephalopathy, fatigue, nausea (reaction to injection)   Nsaids Other (See Comments)    Stomach bleeding episodes; Tylenol is OK   Tomato Hives   Iron (Ferrous Sulfate) [Ferrous Sulfate Er] Other (See Comments)    Shock   Wasp Venom Swelling    Level of Care/Admitting Diagnosis ED Disposition     ED Disposition  Admit   Condition  --   Comment  Hospital Area: [place] [[id number]]  Level of Care: Stepdown [14]  Admit to SDU based on following criteria: Hemodynamic compromise or significant risk of instability:  Patient requiring short term acute titration and management of vasoactive drips, and invasive monitoring (i.e., CVP and Arterial line).  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[place])    History of swelling of feet    Hyperlipidemia    Hypertension    Major depressive disorder, recurrent episode ([place]) 06/05/2015   Obesity    Snores    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   Past Surgical History:  Procedure Laterality Date   ABDOMINAL HYSTERECTOMY     CARPAL TUNNEL RELEASE  05/13/2011   Procedure: CARPAL TUNNEL RELEASE;  Surgeon: Nita Sells, MD;  Location: [place];  Service: Orthopedics;  Laterality: Left;   COLONOSCOPY N/A 03/02/2020   Procedure: COLONOSCOPY;  Surgeon: Ronald Lobo, MD;  [facility];  Service: Endoscopy;  Laterality: N/A;   COLONOSCOPY WITH PROPOFOL N/A 04/28/2014   Procedure: COLONOSCOPY WITH PROPOFOL;  Surgeon: Cleotis Nipper, MD;  Location[facility] ENDOSCOPY;  Service: Endoscopy;  Laterality: N/A;   DG TOES*L*  2/10   rt   DILATION AND CURETTAGE OF UTERUS     ENTEROSCOPY N/A 10/17/2017   Procedure: ENTEROSCOPY;  Surgeon: Otis Brace, MD;  Location: [place];  Service: Gastroenterology;  Laterality: N/A;   ESOPHAGOGASTRODUODENOSCOPY N/A 04/10/2014   Procedure: ESOPHAGOGASTRODUODENOSCOPY (EGD);  Surgeon: [name], MD;  Location: [facility] ENDOSCOPY;  Service: Endoscopy;  Laterality: N/A;   ESOPHAGOGASTRODUODENOSCOPY N/A 05/10/2017   Procedure: ESOPHAGOGASTRODUODENOSCOPY (EGD);  Surgeon: Ronald Lobo, MD;  Location: [facility] ENDOSCOPY;  Service: Endoscopy;  Laterality: N/A;   ESOPHAGOGASTRODUODENOSCOPY N/A 09/22/2017   Procedure: ESOPHAGOGASTRODUODENOSCOPY (EGD);  Surgeon: Clarene Essex, MD;  Location: [place];  Service: Endoscopy;  Laterality: N/A;  bedside   ESOPHAGOGASTRODUODENOSCOPY N/A 03/02/2020   Procedure: ESOPHAGOGASTRODUODENOSCOPY (EGD);  Surgeon: Ronald Lobo, MD;  Location: [facility];  Service: Endoscopy;  Laterality: N/A;   ESOPHAGOGASTRODUODENOSCOPY N/A 11/03/2020   Procedure:  ESOPHAGOGASTRODUODENOSCOPY (EGD);  Surgeon: Wilford Corner, MD;  Location: [place];  Service: Endoscopy;  Laterality: N/A;   ESOPHAGOGASTRODUODENOSCOPY N/A 04/12/2022   Procedure: ESOPHAGOGASTRODUODENOSCOPY (EGD);  Surgeon: Clarene Essex, MD;  Location: [facility];  Service: Gastroenterology;  Laterality: N/A;   ESOPHAGOGASTRODUODENOSCOPY (EGD) WITH PROPOFOL N/A 04/27/2014   Procedure: ESOPHAGOGASTRODUODENOSCOPY (EGD) WITH PROPOFOL;  Surgeon: Cleotis Nipper, MD;  Location: [place];  Service: Endoscopy;  Laterality: N/A;  possible apc   ESOPHAGOGASTRODUODENOSCOPY (EGD) WITH PROPOFOL N/A 09/30/2017   Procedure: ESOPHAGOGASTRODUODENOSCOPY (EGD) WITH PROPOFOL;  Surgeon: [name], MD;  Location: [place];  Service: Gastroenterology;  Laterality: N/A;   ESOPHAGOGASTRODUODENOSCOPY (EGD) WITH PROPOFOL N/A 10/01/2017   Procedure: ESOPHAGOGASTRODUODENOSCOPY (EGD) WITH PROPOFOL;  Surgeon: [name], MD;  Location: [place];  Service: Gastroenterology;  Laterality: N/A;   ESOPHAGOGASTRODUODENOSCOPY (EGD) WITH PROPOFOL N/A 10/08/2017   Procedure: ESOPHAGOGASTRODUODENOSCOPY (EGD) WITH PROPOFOL;  Surgeon: Otis Brace, MD;  Location: [place];  Service: Gastroenterology;  Laterality: N/A;   ESOPHAGOGASTRODUODENOSCOPY (EGD) WITH PROPOFOL N/A 10/17/2017   Procedure: ESOPHAGOGASTRODUODENOSCOPY (EGD) WITH PROPOFOL;  Surgeon: Otis Brace, MD;  Location: [place];  Service: Gastroenterology;  Laterality: N/A;   ESOPHAGOGASTRODUODENOSCOPY (EGD) WITH PROPOFOL N/A 10/19/2017   Procedure: ESOPHAGOGASTRODUODENOSCOPY (EGD) WITH PROPOFOL;  Surgeon: Otis Brace, MD;  Location: [place];  Service: Gastroenterology;  Laterality: N/A;   ESOPHAGOGASTRODUODENOSCOPY (EGD) WITH PROPOFOL N/A 12/04/2018   Procedure: ESOPHAGOGASTRODUODENOSCOPY (EGD) WITH PROPOFOL;  Surgeon: Wilford Corner, MD;  [facility];  Service: Endoscopy;  Laterality: N/A;   GIVENS CAPSULE STUDY N/A  10/02/2017   Procedure: GIVENS CAPSULE STUDY;  Surgeon: [name], MD;  Location: [place];  Service: Gastroenterology;  Laterality: N/A;   GIVENS CAPSULE STUDY N/A 10/08/2017   Procedure: GIVENS CAPSULE STUDY;  Surgeon: Otis Brace, MD;  Location: [place];  Service: Gastroenterology;  Laterality: N/A;  endoscopic placement of capsule   GIVENS CAPSULE STUDY N/A 03/02/2020   Procedure: GIVENS CAPSULE STUDY;  Surgeon: Ronald Lobo, MD;  [facility];  Service: Endoscopy;  Laterality: N/A;   HEMOSTASIS CLIP PLACEMENT  12/04/2018   Procedure: HEMOSTASIS CLIP PLACEMENT;  Surgeon: Wilford Corner, MD;  [facility];  Service: Endoscopy;;   HOT HEMOSTASIS N/A 04/27/2014   Procedure: HOT HEMOSTASIS (ARGON PLASMA COAGULATION/BICAP);  Surgeon: Cleotis Nipper, MD;  Location: [facility] ENDOSCOPY;  Service: Endoscopy;  Laterality: N/A;   HOT HEMOSTASIS N/A 09/30/2017   Procedure: HOT HEMOSTASIS (ARGON PLASMA COAGULATION/BICAP);  Surgeon: [name], MD;  Location: [place];  Service: Gastroenterology;  Laterality: N/A;   HOT HEMOSTASIS N/A 10/01/2017   Procedure: HOT HEMOSTASIS (ARGON PLASMA COAGULATION/BICAP);  Surgeon: [name], MD;  Location: [place];  Service: Gastroenterology;  Laterality: N/A;   HOT HEMOSTASIS N/A 10/17/2017   Procedure: HOT HEMOSTASIS (ARGON PLASMA COAGULATION/BICAP);  Surgeon: Otis Brace, MD;  Location[facility] ENDOSCOPY;  Service: Gastroenterology;  Laterality: N/A;   HOT HEMOSTASIS N/A 10/19/2017   Procedure: HOT HEMOSTASIS (ARGON PLASMA COAGULATION/BICAP);  Surgeon: Otis Brace, MD;  Location: [facility] ENDOSCOPY;  Service: Gastroenterology;  Laterality: N/A;   HOT HEMOSTASIS N/A 03/02/2020   Procedure: HOT HEMOSTASIS (ARGON PLASMA COAGULATION/BICAP);  Surgeon: Ronald Lobo, MD;  Location[facility];  Service: Endoscopy;  Laterality: N/A;   HOT HEMOSTASIS  N/A 04/12/2022   Procedure: HOT HEMOSTASIS (ARGON PLASMA COAGULATION/BICAP);   Surgeon: Clarene Essex, MD;  Location: [facility];  Service: Gastroenterology;  Laterality: N/A;   IR IMAGING GUIDED PORT INSERTION  07/08/2018   L shoulder Surgery  2011   POLYPECTOMY  03/02/2020   Procedure: POLYPECTOMY;  Surgeon: Ronald Lobo, MD;  [facility];  Service: Endoscopy;;   SCLEROTHERAPY  11/03/2020   Procedure: Clide Deutscher;  Surgeon: Wilford Corner, MD;  Location: [facility] ENDOSCOPY;  Service: Endoscopy;;   SUBMUCOSAL INJECTION  09/22/2017   Procedure: SUBMUCOSAL INJECTION;  Surgeon: Clarene Essex, MD;  Location: [facility] ENDOSCOPY;  Service: Endoscopy;;   SUBMUCOSAL INJECTION  12/04/2018   Procedure: SUBMUCOSAL INJECTION;  Surgeon: Wilford Corner, MD;  [facility];  Service: Endoscopy;;     A IV Location/Drains/Wounds Patient Lines/Drains/Airways Status     Active Line/Drains/Airways     Name Placement date Placement time Site Days   Implanted Port 07/08/18 Right Chest 07/08/18  1434  Chest  1418   Peripheral IV 05/25/22 20 G Right Antecubital 05/25/22  2209  Antecubital  1            Intake/Output Last 24 hours No intake or output data in the 24 hours ending 05/26/22 0025  Labs/Imaging Results for orders placed or performed during the hospital encounter of 05/25/22 (from the past 48 hour(s))  Resp panel by RT-PCR (RSV, Flu A&B, Covid) Anterior Nasal Swab     Status: None   Collection Time: 05/25/22  7:01 PM   Specimen: Anterior Nasal Swab  Result Value Ref Range   SARS Coronavirus 2 by RT PCR NEGATIVE NEGATIVE    Comment: (NOTE) SARS-CoV-2 target nucleic acids are NOT DETECTED.  The SARS-CoV-2 RNA is generally detectable in upper respiratory specimens during the acute phase of infection. The lowest concentration of SARS-CoV-2 viral copies this assay can detect is 138 copies/mL. A negative result does not preclude SARS-Cov-2 infection and should not be used as the sole basis for treatment or other patient management decisions. A negative  result may occur with  improper specimen collection/handling, submission of specimen other than nasopharyngeal swab, presence of viral mutation(s) within the areas targeted by this assay, and inadequate number of viral copies(<138 copies/mL). A negative result must be combined with clinical observations, patient history, and epidemiological information. The expected result is Negative.  Fact Sheet for Patients:  EntrepreneurPulse.com.au  Fact Sheet for Healthcare Providers:  IncredibleEmployment.be  This test is no t yet approved or cleared by the [place] FDA and  has been authorized for detection and/or diagnosis of SARS-CoV-2 by FDA under an Emergency Use Authorization (EUA). This EUA will remain  in effect (meaning this test can be used) for the duration of the COVID-19 declaration under Section 564(b)(1) of the Act, 21 U.S.C.section 360bbb-3(b)(1), unless the authorization is terminated  or revoked sooner.       Influenza A by PCR NEGATIVE NEGATIVE   Influenza B by PCR NEGATIVE NEGATIVE    Comment: (NOTE) The Xpert Xpress SARS-CoV-2/FLU/RSV plus assay is intended as an aid in the diagnosis of influenza from Nasopharyngeal swab specimens and should not be used as a sole basis for treatment. Nasal washings and aspirates are unacceptable for Xpert Xpress SARS-CoV-2/FLU/RSV testing.  Fact Sheet for Patients: EntrepreneurPulse.com.au  Fact Sheet for Healthcare Providers: IncredibleEmployment.be  This test is not yet approved or cleared by the [place] FDA and has been authorized for detection and/or diagnosis of SARS-CoV-2 by FDA under an Emergency Use Authorization (  EUA). This EUA will remain in effect (meaning this test can be used) for the duration of the COVID-19 declaration under Section 564(b)(1) of the Act, 21 U.S.C. section 360bbb-3(b)(1), unless the authorization is terminated  or revoked.     Resp Syncytial Virus by PCR NEGATIVE NEGATIVE    Comment: (NOTE) Fact Sheet for Patients: EntrepreneurPulse.com.au  Fact Sheet for Healthcare Providers: IncredibleEmployment.be  This test is not yet approved or cleared by the [place] FDA and has been authorized for detection and/or diagnosis of SARS-CoV-2 by FDA under an Emergency Use Authorization (EUA). This EUA will remain in effect (meaning this test can be used) for the duration of the COVID-19 declaration under Section 564(b)(1) of the Act, 21 U.S.C. section 360bbb-3(b)(1), unless the authorization is terminated or revoked.  Performed at [facility], [place] [address] [place], [place] [address]   Comprehensive metabolic panel     Status: Abnormal   Collection Time: 05/25/22  7:36 PM  Result Value Ref Range   Sodium 138 135 - 145 mmol/L   Potassium 3.5 3.5 - 5.1 mmol/L   Chloride 107 98 - 111 mmol/L   CO2 22 22 - 32 mmol/L   Glucose, Bld 121 (H) 70 - 99 mg/dL    Comment: Glucose reference range applies only to samples taken after fasting for at least 8 hours.   BUN 15 8 - 23 mg/dL   Creatinine, Ser 0.99 0.44 - 1.00 mg/dL   Calcium 8.3 (L) 8.9 - 10.3 mg/dL   Total Protein 6.6 6.5 - 8.1 g/dL   Albumin 3.1 (L) 3.5 - 5.0 g/dL   AST 12 (L) 15 - 41 U/L   ALT 8 0 - 44 U/L   Alkaline Phosphatase 54 38 - 126 U/L   Total Bilirubin 0.4 0.3 - 1.2 mg/dL   GFR, Estimated >60 >60 mL/min    Comment: (NOTE) Calculated using the CKD-EPI Creatinine Equation (2021)    Anion gap 9 5 - 15    Comment: Performed at [facility], [place] [address] [place], [place] [address]  CBC with Differential     Status: Abnormal   Collection Time: 05/25/22  7:36 PM  Result Value Ref Range   WBC 10.8 (H) 4.0 - 10.5 K/uL   RBC 1.95 (L) 3.87 - 5.11 MIL/uL   Hemoglobin 4.5 (LL) 12.0 - 15.0 g/dL    Comment: REPEATED TO VERIFY THIS CRITICAL RESULT HAS VERIFIED  AND BEEN CALLED TO Secily Walthour,J. BY SEEL,MOLLY ON 02 10 2024 AT 2116, AND HAS BEEN READ BACK.     HCT 16.1 (L) 36.0 - 46.0 %   MCV 82.6 80.0 - 100.0 fL   MCH 23.1 (L) 26.0 - 34.0 pg   MCHC 28.0 (L) 30.0 - 36.0 g/dL   RDW 20.7 (H) 11.5 - 15.5 %   Platelets 243 150 - 400 K/uL   nRBC 0.2 0.0 - 0.2 %   Neutrophils Relative % 88 %   Neutro Abs 9.5 (H) 1.7 - 7.7 K/uL   Lymphocytes Relative 8 %   Lymphs Abs 0.9 0.7 - 4.0 K/uL   Monocytes Relative 3 %   Monocytes Absolute 0.3 0.1 - 1.0 K/uL   Eosinophils Relative 0 %   Eosinophils Absolute 0.0 0.0 - 0.5 K/uL   Basophils Relative 0 %   Basophils Absolute 0.0 0.0 - 0.1 K/uL   Immature Granulocytes 1 %   Abs Immature Granulocytes 0.05 0.00 - 0.07 K/uL    Comment: Performed at [facility]  [place] [address] [place], [place] [address]  Lipase, blood     Status: None   Collection Time: 05/25/22  7:36 PM  Result Value Ref Range   Lipase 28 11 - 51 U/L    Comment: Performed at [facility], [place] [address] [place], [place] [address]  Protime-INR     Status: None   Collection Time: 05/25/22  7:36 PM  Result Value Ref Range   Prothrombin Time 14.9 11.4 - 15.2 seconds   INR 1.2 0.8 - 1.2    Comment: (NOTE) INR goal varies based on device and disease states. Performed at [facility], [place] [address], [place], [place] [address]   APTT     Status: None   Collection Time: 05/25/22  7:36 PM  Result Value Ref Range   aPTT 32 24 - 36 seconds    Comment: Performed at [facility], [place] [address], [place], [place] [address]  POC occult blood, ED     Status: Abnormal   Collection Time: 05/25/22  9:38 PM  Result Value Ref Range   Fecal Occult Bld POSITIVE (A) NEGATIVE  Type and screen [place]     Status: None (Preliminary result)   Collection Time: 05/25/22 10:33 PM  Result Value Ref Range   ABO/RH(D) A NEG    Antibody Screen NEG    Sample Expiration  05/28/2022,2359    Unit Number [id number]    Blood Component Type RBC, LR IRR    Unit division 00    Status of Unit ISSUED    Transfusion Status OK TO TRANSFUSE    Crossmatch Result      Compatible Performed at [place] [address] [place], [place] [address]    Unit Number [id number]    Blood Component Type RBC, LR IRR    Unit division 00    Status of Unit ALLOCATED    Transfusion Status OK TO TRANSFUSE    Crossmatch Result Compatible   Prepare RBC (crossmatch)     Status: None   Collection Time: 05/25/22 10:49 PM  Result Value Ref Range   Order Confirmation      ORDER PROCESSED BY BLOOD BANK Performed at [facility], [place] [address] [place], [place] [address]    *Note: Due to a large number of results and/or encounters for the requested time period, some results have not been displayed. A complete set of results can be found in Results Review.   CT ANGIO GI BLEED  Result Date: 05/25/2022 CLINICAL DATA:  Vomiting blood. EXAM: CTA ABDOMEN AND PELVIS WITHOUT AND WITH CONTRAST TECHNIQUE: Multidetector CT imaging of the abdomen and pelvis was performed using the standard protocol during bolus administration of intravenous contrast. Multiplanar reconstructed images and MIPs were obtained and reviewed to evaluate the vascular anatomy. RADIATION DOSE REDUCTION: This exam was performed according to the departmental dose-optimization program which includes automated exposure control, adjustment of the mA and/or kV according to patient size and/or use of iterative reconstruction technique. CONTRAST:  164m OMNIPAQUE IOHEXOL 350 MG/ML SOLN COMPARISON:  CTA GI bleed 04/11/2022 FINDINGS: VASCULAR Aorta: Normal caliber aorta without aneurysm, dissection, vasculitis or significant stenosis. There are mild calcified atherosclerotic plaque throughout the aorta. Celiac: Patent without evidence of aneurysm, dissection, vasculitis or significant stenosis.  SMA: Patent without evidence of aneurysm, dissection, vasculitis or significant stenosis. Renals: Both renal arteries are patent without evidence of aneurysm, dissection, vasculitis, fibromuscular dysplasia or  significant stenosis. IMA: Patent without evidence of aneurysm, dissection, vasculitis or significant stenosis. Inflow: Patent without evidence of aneurysm, dissection, vasculitis or significant stenosis. There is calcified atherosclerotic disease present. Proximal Outflow: Bilateral common femoral and visualized portions of the superficial and profunda femoral arteries are patent without evidence of aneurysm, dissection, vasculitis or significant stenosis. Veins: No obvious venous abnormality within the limitations of this arterial phase study. Review of the MIP images confirms the above findings. NON-VASCULAR Lower chest: No acute abnormality. Hepatobiliary: Gallstones are present. There is no biliary ductal dilatation. No focal liver lesions are seen. Pancreas: Unremarkable. No pancreatic ductal dilatation or surrounding inflammatory changes. Spleen: Normal in size without focal abnormality. Adrenals/Urinary Tract: There is a 17 mm cyst in the right kidney. Otherwise, the adrenal glands, kidneys and bladder are within normal limits. Stomach/Bowel: Stomach is within normal limits. Appendix appears normal. No evidence of bowel wall thickening, distention, or inflammatory changes. No active gastrointestinal bleeding identified. There surgical clips adjacent to the stomach, unchanged. Lymphatic: No enlarged lymph nodes are seen. Reproductive: Status post hysterectomy. No adnexal masses. Other: Scarring and calcifications are seen in the lower anterior abdominal wall, similar to prior. There is a small fat containing umbilical hernia. There is no ascites. Musculoskeletal: Minimal compression deformity of L4 is unchanged. No acute fractures are identified. IMPRESSION: 1. No evidence for active gastrointestinal  bleeding. 2. No acute vascular pathology in the abdomen or pelvis. Aortic Atherosclerosis (ICD10-I70.0). NON-VASCULAR 1. Cholelithiasis. 2. Right Bosniak I benign renal cyst measuring 1.7 cm. No follow-up imaging is recommended. JACR 2018 Feb; 264-273, Management of the Incidental Renal Mass on CT, [employer] 2021; 814-848, Bosniak Classification of Cystic Renal Masses, Version 2019. Electronically Signed   By: Ronney Asters M.D.   On: 05/25/2022 22:59   DG Chest Portable 1 View  Result Date: 05/25/2022 CLINICAL DATA:  Weakness, nausea/vomiting, cough EXAM: PORTABLE CHEST 1 VIEW COMPARISON:  Chest x-ray September 14, 2020 FINDINGS: Right chest wall port catheter in place. The cardiomediastinal silhouette is unchanged in contour, including mild cardiomegaly. No focal pulmonary opacity. No pleural effusion or pneumothorax. Partially visualized coil material projecting over the left upper abdomen. No acute osseous abnormality. IMPRESSION: 1. No acute pulmonary abnormality. 2. Unchanged cardiomegaly. Electronically Signed   By: Beryle Flock M.D.   On: 05/25/2022 19:21    Pending Labs Unresulted Labs (From admission, onward)     Start     Ordered   05/26/22 0519  Hemoglobin and hematocrit, blood  Now then every 6 hours,   R (with TIMED occurrences)      05/25/22 2327   05/26/22 0500  Hemoglobin A1c  Tomorrow morning,   R       Comments: To assess prior glycemic control    05/25/22 2344   05/26/22 0500  CBC with Differential/Platelet  Tomorrow morning,   R        05/25/22 2346   05/26/22 0500  Comprehensive metabolic panel  Tomorrow morning,   R        05/25/22 2346   05/26/22 0500  Magnesium  Tomorrow morning,   R        05/25/22 2346   05/26/22 0500  Phosphorus  Tomorrow morning,   R        05/25/22 2346   05/25/22 2330  Urinalysis, Routine w reflex microscopic -Urine, Clean Catch  Once,   R       Question:  Specimen Source  Answer:  Urine, Clean Catch   05/25/22 2329  Vitals/Pain Today's Vitals   05/25/22 2102 05/25/22 2200 05/25/22 2202 05/26/22 0017  BP:  (!) 134/104 (!) 134/104 118/62  Pulse:  73 73 76  Resp:  14 19 18  $ Temp:   97.7 F (36.5 C) 99.1 F (37.3 C)  TempSrc:   Oral Oral  SpO2:  100% 100%   Weight: 102.6 kg     Height: 5' 5"$  (1.651 m)     PainSc:        Isolation Precautions Airborne and Contact precautions  Medications Medications  pantoprozole (PROTONIX) 80 mg /NS 100 mL infusion (8 mg/hr Intravenous New Bag/Given 05/25/22 2257)  prochlorperazine (COMPAZINE) injection 5 mg (has no administration in time range)  ipratropium-albuterol (DUONEB) 0.5-2.5 (3) MG/3ML nebulizer solution 3 mL (has no administration in time range)  guaiFENesin (ROBITUSSIN) 100 MG/5ML liquid 5 mL (has no administration in time range)  ALPRAZolam (XANAX) tablet 0.5 mg (has no administration in time range)  sertraline (ZOLOFT) tablet 100 mg (has no administration in time range)  traZODone (DESYREL) tablet 50 mg (has no administration in time range)  acetaminophen (TYLENOL) tablet 650 mg (has no administration in time range)  insulin aspart (novoLOG) injection 0-9 Units (has no administration in time range)  pantoprazole (PROTONIX) injection 40 mg (40 mg Intravenous Given 05/25/22 1941)  0.9 %  sodium chloride infusion (Manually program via Guardrails IV Fluids) ( Intravenous New Bag/Given 05/26/22 0016)  iohexol (OMNIPAQUE) 350 MG/ML injection 100 mL (100 mLs Intravenous Contrast Given 05/25/22 2221)    Mobility walks at home but has not gotten up in ED     Focused Assessments Pulmonary Assessment Handoff:  Lung sounds: Bilateral Breath Sounds: Clear O2 Device: Room Air      R Recommendations: See Admitting Provider Note  Report given to:   Additional Notes:

## 2022-05-26 NOTE — Consult Note (Signed)
Referring Provider: Dr. Tawanna Solo Primary Care Physician:  Drucie Opitz, MD Primary Gastroenterologist:  Dr. Cristina Gong  Reason for Consultation:  Hematemesis  HPI: Peggy House is a 62 y.o. female with multiple medical problems as stated below had the acute onset of hematemesis at home X 2 along with black stools. Hgb 4.5 (8.6 on 04/14/22). Felt weak as well. Denies dizziness. Denies abdominal pain. 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 Colon in 02/2020 where 2 small nonbleeding AVMs were fulgurated. Hyperplastic polyps also removed. Capsule in 2019 with small bowel AVMs noted and blood seen that was thought to be from ongoing epistaxis at the time. Denies NSAIDs or alcohol.  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  - Pulmonary Hypertension  Past Surgical History:  Procedure Laterality Date   ABDOMINAL HYSTERECTOMY     CARPAL TUNNEL RELEASE  05/13/2011   Procedure: CARPAL TUNNEL RELEASE;  Surgeon: Nita Sells, MD;  Location: [place];  Service: Orthopedics;  Laterality: Left;   COLONOSCOPY N/A 03/02/2020   Procedure: COLONOSCOPY;  Surgeon: Ronald Lobo, MD;  Location: [facility];  Service: Endoscopy;  Laterality: N/A;   COLONOSCOPY WITH PROPOFOL N/A 04/28/2014   Procedure: COLONOSCOPY WITH PROPOFOL;  Surgeon: Cleotis Nipper, MD;  Location: [facility] ENDOSCOPY;  Service: Endoscopy;  Laterality: N/A;   DG TOES*L*  2/10   rt   DILATION AND CURETTAGE OF UTERUS     ENTEROSCOPY N/A 10/17/2017   Procedure: ENTEROSCOPY;  Surgeon: Otis Brace, MD;  Location: [place];  Service: Gastroenterology;  Laterality: N/A;   ESOPHAGOGASTRODUODENOSCOPY N/A 04/10/2014   Procedure: ESOPHAGOGASTRODUODENOSCOPY (EGD);  Surgeon: [name], MD;  Location: [facility] ENDOSCOPY;  Service: Endoscopy;  Laterality: N/A;   ESOPHAGOGASTRODUODENOSCOPY N/A 05/10/2017   Procedure: ESOPHAGOGASTRODUODENOSCOPY (EGD);  Surgeon: Ronald Lobo, MD;  Location: [facility] ENDOSCOPY;  Service: Endoscopy;  Laterality: N/A;   ESOPHAGOGASTRODUODENOSCOPY N/A 09/22/2017   Procedure: ESOPHAGOGASTRODUODENOSCOPY (EGD);  Surgeon: Clarene Essex, MD;  Location: [place];  Service: Endoscopy;  Laterality: N/A;  bedside   ESOPHAGOGASTRODUODENOSCOPY N/A 03/02/2020   Procedure: ESOPHAGOGASTRODUODENOSCOPY (EGD);  Surgeon: Ronald Lobo, MD;  Location: [facility];  Service: Endoscopy;  Laterality: N/A;   ESOPHAGOGASTRODUODENOSCOPY N/A 11/03/2020   Procedure: ESOPHAGOGASTRODUODENOSCOPY (EGD);  Surgeon: Wilford Corner, MD;  Location: [place];  Service: Endoscopy;  Laterality: N/A;   ESOPHAGOGASTRODUODENOSCOPY N/A 04/12/2022   Procedure: ESOPHAGOGASTRODUODENOSCOPY (EGD);  Surgeon: Clarene Essex, MD;  Location: [facility];  Service: Gastroenterology;  Laterality: N/A;   ESOPHAGOGASTRODUODENOSCOPY (EGD) WITH PROPOFOL N/A 04/27/2014   Procedure: ESOPHAGOGASTRODUODENOSCOPY (EGD) WITH PROPOFOL;  Surgeon: Cleotis Nipper, MD;  Location: [place];  Service: Endoscopy;  Laterality: N/A;  possible apc   ESOPHAGOGASTRODUODENOSCOPY (EGD) WITH PROPOFOL N/A 09/30/2017   Procedure: ESOPHAGOGASTRODUODENOSCOPY (EGD) WITH PROPOFOL;  Surgeon: [name], MD;  Location: [place];  Service: Gastroenterology;  Laterality: N/A;   ESOPHAGOGASTRODUODENOSCOPY (EGD) WITH PROPOFOL N/A 10/01/2017   Procedure: ESOPHAGOGASTRODUODENOSCOPY (EGD) WITH PROPOFOL;  Surgeon: [name], MD;  Location: [place];  Service: Gastroenterology;  Laterality: N/A;   ESOPHAGOGASTRODUODENOSCOPY (  EGD) WITH  PROPOFOL N/A 10/08/2017   Procedure: ESOPHAGOGASTRODUODENOSCOPY (EGD) WITH PROPOFOL;  Surgeon: Otis Brace, MD;  Location: [place];  Service: Gastroenterology;  Laterality: N/A;   ESOPHAGOGASTRODUODENOSCOPY (EGD) WITH PROPOFOL N/A 10/17/2017   Procedure: ESOPHAGOGASTRODUODENOSCOPY (EGD) WITH PROPOFOL;  Surgeon: Otis Brace, MD;  Location: [place];  Service: Gastroenterology;  Laterality: N/A;   ESOPHAGOGASTRODUODENOSCOPY (EGD) WITH PROPOFOL N/A 10/19/2017   Procedure: ESOPHAGOGASTRODUODENOSCOPY (EGD) WITH PROPOFOL;  Surgeon: Otis Brace, MD;  Location: [place];  Service: Gastroenterology;  Laterality: N/A;   ESOPHAGOGASTRODUODENOSCOPY (EGD) WITH PROPOFOL N/A 12/04/2018   Procedure: ESOPHAGOGASTRODUODENOSCOPY (EGD) WITH PROPOFOL;  Surgeon: Wilford Corner, MD;  Location[facility];  Service: Endoscopy;  Laterality: N/A;   GIVENS CAPSULE STUDY N/A 10/02/2017   Procedure: GIVENS CAPSULE STUDY;  Surgeon: [name], MD;  Location: [place];  Service: Gastroenterology;  Laterality: N/A;   GIVENS CAPSULE STUDY N/A 10/08/2017   Procedure: GIVENS CAPSULE STUDY;  Surgeon: Otis Brace, MD;  Location: [place];  Service: Gastroenterology;  Laterality: N/A;  endoscopic placement of capsule   GIVENS CAPSULE STUDY N/A 03/02/2020   Procedure: GIVENS CAPSULE STUDY;  Surgeon: Ronald Lobo, MD;  Location: [facility];  Service: Endoscopy;  Laterality: N/A;   HEMOSTASIS CLIP PLACEMENT  12/04/2018   Procedure: HEMOSTASIS CLIP PLACEMENT;  Surgeon: Wilford Corner, MD;  Location: [facility];  Service: Endoscopy;;   HOT HEMOSTASIS N/A 04/27/2014   Procedure: HOT HEMOSTASIS (ARGON PLASMA COAGULATION/BICAP);  Surgeon: Cleotis Nipper, MD;  Location[facility] END[facility]COPY;  Service: Endoscopy;  Laterality: N/A;   HOT HEMOSTASIS N/A 09/30/2017   Procedure: HOT HEMOSTASIS (ARGON PLASMA COAGULATION/BICAP);  Surgeon: [name], MD;  Location: [place];  Service: Gastroenterology;   Laterality: N/A;   HOT HEMOSTASIS N/A 10/01/2017   Procedure: HOT HEMOSTASIS (ARGON PLASMA COAGULATION/BICAP);  Surgeon: [name], MD;  Location: [place];  Service: Gastroenterology;  Laterality: N/A;   HOT HEMOSTASIS N/A 10/17/2017   Procedure: HOT HEMOSTASIS (ARGON PLASMA COAGULATION/BICAP);  Surgeon: Otis Brace, MD;  Location: [facility] ENDOSCOPY;  Service: Gastroenterology;  Laterality: N/A;   HOT HEMOSTASIS N/A 10/19/2017   Procedure: HOT HEMOSTASIS (ARGON PLASMA COAGULATION/BICAP);  Surgeon: Otis Brace, MD;  Location: [facility] ENDOSCOPY;  Service: Gastroenterology;  Laterality: N/A;   HOT HEMOSTASIS N/A 03/02/2020   Procedure: HOT HEMOSTASIS (ARGON PLASMA COAGULATION/BICAP);  Surgeon: Ronald Lobo, MD;  Location: [facility];  Service: Endoscopy;  Laterality: N/A;   HOT HEMOSTASIS N/A 04/12/2022   Procedure: HOT HEMOSTASIS (ARGON PLASMA COAGULATION/BICAP);  Surgeon: Clarene Essex, MD;  Location: [facility];  Service: Gastroenterology;  Laterality: N/A;   IR IMAGING GUIDED PORT INSERTION  07/08/2018   L shoulder Surgery  2011   POLYPECTOMY  03/02/2020   Procedure: POLYPECTOMY;  Surgeon: Ronald Lobo, MD;  Location[facility];  Service: Endoscopy;;   SCLEROTHERAPY  11/03/2020   Procedure: Clide Deutscher;  Surgeon: Wilford Corner, MD;  Location: [facility] ENDOSCOPY;  Service: Endoscopy;;   SUBMUCOSAL INJECTION  09/22/2017   Procedure: SUBMUCOSAL INJECTION;  Surgeon: Clarene Essex, MD;  Location: [facility] ENDOSCOPY;  Service: Endoscopy;;   SUBMUCOSAL INJECTION  12/04/2018   Procedure: SUBMUCOSAL INJECTION;  Surgeon: Wilford Corner, MD;  Location: [facility]OSCOPY;  Service: Endoscopy;;    Prior to Admission medications   Medication Sig Start Date End Date Taking? Authorizing Provider  ALPRAZolam Duanne Moron) 0.5 MG tablet Take 1 tablet (0.5 mg total) by mouth at bedtime as needed for anxiety or sleep. Patient taking differently: Take 0.5 mg by mouth at bedtime. 03/05/22  Yes Nooruddin, Marlene Lard, MD   Aminocaproic  Acid 1000 MG TABS TAKE 1 TABLET(1000 MG) BY MOUTH IN THE MORNING AND AT BEDTIME Patient taking differently: Take 1,000 mg by mouth in the morning and at bedtime. 02/04/22  Yes Truitt Merle, MD  amLODipine (NORVASC) 10 MG tablet Take 1 tablet (10 mg total) by mouth daily. 06/22/21  Yes [name], MD  lidocaine-prilocaine (EMLA) cream Apply 1 application topically as needed. Patient taking differently: Apply 1 application  topically as needed (for port access). 07/17/18  Yes Truitt Merle, MD  metFORMIN (GLUCOPHAGE) 500 MG tablet TAKE 1 TABLET(500 MG) BY MOUTH TWICE DAILY WITH A MEAL Patient taking differently: Take 500 mg by mouth 2 (two) times daily with a meal. 11/21/21  Yes Farrel Gordon, DO  ondansetron (ZOFRAN) 4 MG tablet Take 1 tablet (4 mg total) by mouth every 8 (eight) hours as needed for nausea or vomiting. 04/02/22  Yes Truitt Merle, MD  pantoprazole (PROTONIX) 40 MG tablet TAKE 1 TABLET(40 MG) BY MOUTH TWICE DAILY Patient taking differently: Take 40 mg by mouth 2 (two) times daily before a meal. 01/11/22  Yes Nooruddin, Marlene Lard, MD  Podiatric Products (FLEXITOL HEEL BALM) OINT Apply 1 application  topically as needed (to the feet- for dry skin). 06/10/19  Yes [provider]  PROAIR HFA 108 (90 Base) MCG/ACT inhaler INHALE 1 TO 2 PUFFS INTO THE LUNGS EVERY 6 HOURS AS NEEDED FOR WHEEZING OR SHORTNESS OF BREATH Patient taking differently: Inhale 1-2 puffs into the lungs every 6 (six) hours as needed for wheezing or shortness of breath. 11/10/20  Yes Truitt Merle, MD  sertraline (ZOLOFT) 100 MG tablet Take 1 tablet (100 mg total) by mouth daily. 11/21/21  Yes Farrel Gordon, DO  terbinafine (LAMISIL AT) 1 % cream Apply 1 application topically 2 (two) times daily. Both bottom and top of both feet and toes Patient taking differently: Apply 1 application  topically 2 (two) times daily as needed (for affected areas of both feet and toes- bottoms and tops). 06/16/20  Yes Seawell, Jaimie A, DO   traZODone (DESYREL) 100 MG tablet TAKE 1 TABLET(100 MG) BY MOUTH AT BEDTIME Patient taking differently: Take 100 mg by mouth at bedtime. 12/03/21  Yes Dean, Raquel Sarna, DO  TYLENOL PM EXTRA STRENGTH 500-25 MG TABS tablet Take 2 tablets by mouth at bedtime as needed (for sleep).   Yes [provider]  Accu-Chek Softclix Lancets lancets Use to check blood sugar before breakfast and before dinner while on steroids Patient taking differently: 1 each by Other route See admin instructions. Use to check blood sugar before breakfast and before dinner while on steroids 09/27/19   [name], MD  glucose blood (ACCU-CHEK GUIDE) test strip Check blood sugar 2 times per day while on steroids before breakfast and dinner Patient taking differently: 1 each by Other route See admin instructions. Check blood sugar 2 times per day while on steroids before breakfast and dinner 09/27/19   [name], MD    Scheduled Meds:  insulin aspart  0-9 Units Subcutaneous Q4H   ipratropium-albuterol  3 mL Nebulization Q6H   sertraline  100 mg Oral Daily   Continuous Infusions:  pantoprazole 8 mg/hr (05/26/22 1019)   potassium chloride     PRN Meds:.acetaminophen, ALPRAZolam, guaiFENesin, prochlorperazine, traZODone  Allergies as of 05/25/2022 - Review Complete 05/25/2022  Allergen Reaction Noted   Feraheme [ferumoxytol] Other (See Comments) 10/09/2017   Nsaids Other (See Comments) 04/25/2014   Tomato Hives 01/01/2012   Iron (ferrous sulfate) [ferrous sulfate er] Other (  See Comments) 03/07/2020   Wasp venom Swelling 10/06/2017    Family History  Problem Relation Age of Onset   Cancer Mother        Ovarian   Ovarian cancer Mother    Diabetes Mother    Kidney disease Mother    Bleeding Disorder Mother    Diabetes type II Sister    Bleeding Disorder Sister    Diabetes Sister    Colon cancer Maternal Grandfather    Crohn's disease Maternal Grandfather    Stomach cancer Maternal Grandmother     Kidney disease Son    Bleeding Disorder Son    Dysmenorrhea Neg Hx     Social History   Socioeconomic History   Marital status: Widowed    Spouse name: Not on file   Number of children: 3   Years of education: Not on file   Highest education level: Not on file  Occupational History   Not on file  Tobacco Use   Smoking status: Every Day    Packs/day: 0.50    Years: 30.00    Total pack years: 15.00    Types: Cigarettes   Smokeless tobacco: Former    Types: Snuff    Quit date: 51   Tobacco comments:    1/2 PPD  Vaping Use   Vaping Use: Never used  Substance and Sexual Activity   Alcohol use: No    Alcohol/week: 0.0 standard drinks of alcohol   Drug use: No    Comment: last cocaine-2010   Sexual activity: Yes    Birth control/protection: Surgical    Comment: Hysterectomy  Other Topics Concern   Not on file  Social History Narrative   Not on file   Social Determinants of Health   Financial Resource Strain: Not on file  Food Insecurity: No Food Insecurity (04/12/2022)   Hunger Vital Sign    Worried About Running Out of Food in the Last Year: Never true    Ran Out of Food in the Last Year: Never true  Transportation Needs: Unmet Transportation Needs (04/12/2022)   PRAPARE - Hydrologist (Medical): Yes    Lack of Transportation (Non-Medical): Yes  Physical Activity: Not on file  Stress: Not on file  Social Connections: Not on file  Intimate Partner Violence: Not At Risk (04/12/2022)   Humiliation, Afraid, Rape, and Kick questionnaire    Fear of Current or Ex-Partner: No    Emotionally Abused: No    Physically Abused: No    Sexually Abused: No    Review of Systems: All negative except as stated above in HPI.  Physical Exam: Vital signs: Vitals:   05/26/22 1007 05/26/22 1208  BP: (!) 141/56   Pulse: 77   Resp: 18   Temp: 98.1 F (36.7 C) 99 F (37.2 C)  SpO2: 98%    Last BM Date : 05/25/22 General:  Lethargic, obese,  no acute distress, pleasant  Head: normocephalic, atraumatic Eyes: anicteric sclera ENT: oropharynx clear Neck: supple, nontender Lungs:  Clear throughout to auscultation.   No wheezes, crackles, or rhonchi. No acute distress. Heart:  Regular rate and rhythm; no murmurs, clicks, rubs,  or gallops. Abdomen: soft, nontender, nondistended, +BS  Rectal:  Deferred Ext: no edema  GI:  Lab Results: Recent Labs    05/25/22 1936 05/26/22 0700  WBC 10.8* 11.0*  HGB 4.5* 6.1*  HCT 16.1* 19.9*  PLT 243 252   BMET Recent Labs    05/25/22 1936 05/26/22  0700  NA 138 142  K 3.5 3.4*  CL 107 110  CO2 22 23  GLUCOSE 121* 95  BUN 15 13  CREATININE 0.99 0.85  CALCIUM 8.3* 8.2*   LFT Recent Labs    05/26/22 0700  PROT 6.2*  ALBUMIN 2.9*  AST 13*  ALT 7  ALKPHOS 51  BILITOT 0.7   PT/INR Recent Labs    05/25/22 1936  LABPROT 14.9  INR 1.2     Studies/Results: CT ANGIO GI BLEED  Result Date: 05/25/2022 CLINICAL DATA:  Vomiting blood. EXAM: CTA ABDOMEN AND PELVIS WITHOUT AND WITH CONTRAST TECHNIQUE: Multidetector CT imaging of the abdomen and pelvis was performed using the standard protocol during bolus administration of intravenous contrast. Multiplanar reconstructed images and MIPs were obtained and reviewed to evaluate the vascular anatomy. RADIATION DOSE REDUCTION: This exam was performed according to the departmental dose-optimization program which includes automated exposure control, adjustment of the mA and/or kV according to patient size and/or use of iterative reconstruction technique. CONTRAST:  12m OMNIPAQUE IOHEXOL 350 MG/ML SOLN COMPARISON:  CTA GI bleed 04/11/2022 FINDINGS: VASCULAR Aorta: Normal caliber aorta without aneurysm, dissection, vasculitis or significant stenosis. There are mild calcified atherosclerotic plaque throughout the aorta. Celiac: Patent without evidence of aneurysm, dissection, vasculitis or significant stenosis. SMA: Patent without evidence of  aneurysm, dissection, vasculitis or significant stenosis. Renals: Both renal arteries are patent without evidence of aneurysm, dissection, vasculitis, fibromuscular dysplasia or significant stenosis. IMA: Patent without evidence of aneurysm, dissection, vasculitis or significant stenosis. Inflow: Patent without evidence of aneurysm, dissection, vasculitis or significant stenosis. There is calcified atherosclerotic disease present. Proximal Outflow: Bilateral common femoral and visualized portions of the superficial and profunda femoral arteries are patent without evidence of aneurysm, dissection, vasculitis or significant stenosis. Veins: No obvious venous abnormality within the limitations of this arterial phase study. Review of the MIP images confirms the above findings. NON-VASCULAR Lower chest: No acute abnormality. Hepatobiliary: Gallstones are present. There is no biliary ductal dilatation. No focal liver lesions are seen. Pancreas: Unremarkable. No pancreatic ductal dilatation or surrounding inflammatory changes. Spleen: Normal in size without focal abnormality. Adrenals/Urinary Tract: There is a 17 mm cyst in the right kidney. Otherwise, the adrenal glands, kidneys and bladder are within normal limits. Stomach/Bowel: Stomach is within normal limits. Appendix appears normal. No evidence of bowel wall thickening, distention, or inflammatory changes. No active gastrointestinal bleeding identified. There surgical clips adjacent to the stomach, unchanged. Lymphatic: No enlarged lymph nodes are seen. Reproductive: Status post hysterectomy. No adnexal masses. Other: Scarring and calcifications are seen in the lower anterior abdominal wall, similar to prior. There is a small fat containing umbilical hernia. There is no ascites. Musculoskeletal: Minimal compression deformity of L4 is unchanged. No acute fractures are identified. IMPRESSION: 1. No evidence for active gastrointestinal bleeding. 2. No acute vascular  pathology in the abdomen or pelvis. Aortic Atherosclerosis (ICD10-I70.0). NON-VASCULAR 1. Cholelithiasis. 2. Right Bosniak I benign renal cyst measuring 1.7 cm. No follow-up imaging is recommended. JACR 2018 Feb; [id number], Management of the Incidental Renal Mass on CT, [employer] 2021; [id number], Bosniak Classification of Cystic Renal Masses, Version 2019. Electronically Signed   By: ARonney AstersM.D.   On: 05/25/2022 22:59   DG Chest Portable 1 View  Result Date: 05/25/2022 CLINICAL DATA:  Weakness, nausea/vomiting, cough EXAM: PORTABLE CHEST 1 VIEW COMPARISON:  Chest x-ray September 14, 2020 FINDINGS: Right chest wall port catheter in place. The cardiomediastinal silhouette is unchanged in contour, including mild cardiomegaly. No focal pulmonary  opacity. No pleural effusion or pneumothorax. Partially visualized coil material projecting over the left upper abdomen. No acute osseous abnormality. IMPRESSION: 1. No acute pulmonary abnormality. 2. Unchanged cardiomegaly. Electronically Signed   By: Beryle Flock M.D.   On: 05/25/2022 19:21    Impression/Plan: Hematemesis with severe anemia (Hgb 4.5). S/P 2 U PRCBCs with Hgb increase to 6.1. One additional unit being transfused this morning. Question AVMs vs esophagitis vs peptic ulcer disease (less likely with EGD in late Dec). Protonix drip. Clear liquid diet. NPO p MN. Supportive care. EGD tomorrow morning by Dr. Paulita Fujita and if unrevealing may need an updated capsule endoscopy.    LOS: 1 day   [name]  05/26/2022, 12:23 PM  Questions please call [phone]

## 2022-05-26 NOTE — Progress Notes (Signed)
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  Patient was recently taking ibuprofen for body aches on patient. On presentation,hemoglobin was found to be 4.5.  Patient was transfused units of 3 units PRBC.  GI consulted.  She is hemodynamically stable.   Assessment & Plan:  Principal Problem:   GI bleed  GI bleed/hematemesis: Presented with 2 episodes of hematemesis, 1 episode of bloody bowel movement.  Taking NSAIDs.  GI consulted.  Continue Protonix drip. Denies any nausea, vomiting or abdominal pain.  Abdomen is benign on examination.  No new episodes of hematochezia or melena since admission. She was admitted with similar scenario on 04/11/2022.  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 She has required multiple blood transfusions, APCs since 2019.  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  Acute on chronic blood loss anemia: History of chronic GI bleed.  History of hereditary hemorrhagic telangiectasia.  Hemoglobin of 4.5 on admission, transfused with 3  units.  Continue monitor H&H.  Has a port on the right chest for iron infusion.  Follows with Dr. Burr Medico  Diabetes type 2: Currently on sliding scale insulin.  Home medications on hold.  Obesity: BMI 37.6  Prolonged QT: QTc of 510.  Continue to monitor, monitor electrolytes.  Will repeat EKG today  Mild leukocytosis:  No infectious process.  Continue monitoring  Chronic anxiety/depression: Home medications resumed  Obesity: BMI of 37.6       DVT prophylaxis:SCDs Start: 05/25/22 2238     Code Status: Full Code  Family Communication: None at bedside  Patient status:Inpatient  Patient is from :Home  Anticipated discharge [id number]  Estimated DC date: After full workup for GI bleed   Consultants: GI  Procedures: None yet  Antimicrobials:  Anti-infectives (From admission, onward)    None       Subjective: Patient seen and examined at bedside today.  Hemodynamically stable comfortable.  Last bowel movement was on Friday.  No new episodes of hematochezia or melena.  Lying comfortably in bed.  Denies any abdomen pain, nausea or vomiting  Objective: Vitals:   05/26/22 0515 05/26/22 0545 05/26/22 0600 05/26/22 0700  BP: (!) 177/74  (!) 143/66 (!) 146/68  Pulse: 78 76 74 72  Resp: 19 (!) 21 18 17  $ Temp: 98.2 F (36.8 C)     TempSrc: Oral     SpO2: 100% 100% 99% 99%  Weight:      Height:        Intake/Output Summary (Last 24 hours) at 05/26/2022 0757 Last data filed at 05/26/2022 0515 Gross per 24 hour  Intake 546 ml  Output --  Net 546 ml   Filed Weights   05/25/22 2102  Weight: 102.6 kg    Examination:  General exam: Overall comfortable, not in distress, obese HEENT: PERRL Respiratory system:  no wheezes or crackles  Cardiovascular system: S1 & S2 heard, RRR.  Port on the right chest Gastrointestinal system: Abdomen is nondistended,  soft and nontender. Central nervous system: Alert and oriented Extremities: No edema, no clubbing ,no cyanosis Skin: No rashes, no ulcers,no icterus     Data Reviewed: I have personally reviewed following labs and imaging studies  CBC: Recent Labs  Lab 05/25/22 1936  WBC 10.8*  NEUTROABS 9.5*  HGB 4.5*  HCT 16.1*  MCV 82.6  PLT 0000000   Basic Metabolic Panel: Recent Labs  Lab 05/25/22 1936  NA 138  K 3.5  CL 107  CO2 22   GLUCOSE 121*  BUN 15  CREATININE 0.99  CALCIUM 8.3*     Recent Results (from the past 240 hour(s))  Resp panel by RT-PCR (RSV, Flu A&B, Covid) Anterior Nasal Swab     Status: None   Collection Time: 05/25/22  7:01 PM   Specimen: Anterior Nasal Swab  Result Value Ref Range Status   SARS Coronavirus 2 by RT PCR NEGATIVE NEGATIVE Final    Comment: (NOTE) SARS-CoV-2 target nucleic acids are NOT DETECTED.  The SARS-CoV-2 RNA is generally detectable in upper respiratory specimens during the acute phase of infection. The lowest concentration of SARS-CoV-2 viral copies this assay can detect is 138 copies/mL. A negative result does not preclude SARS-Cov-2 infection and should not be used as the sole basis for treatment or other patient management decisions. A negative result may occur with  improper specimen collection/handling, submission of specimen other than nasopharyngeal swab, presence of viral mutation(s) within the areas targeted by this assay, and inadequate number of viral copies(<138 copies/mL). A negative result must be combined with clinical observations, patient history, and epidemiological information. The expected result is Negative.  Fact Sheet for Patients:  EntrepreneurPulse.com.au  Fact Sheet for Healthcare Providers:  IncredibleEmployment.be  This test is no t yet approved or cleared by the [place] FDA and  has been authorized for detection and/or diagnosis of SARS-CoV-2 by FDA under an Emergency Use Authorization (EUA). This EUA will remain  in effect (meaning this test can be used) for the duration of the COVID-19 declaration under Section 564(b)(1) of the Act, 21 U.S.C.section 360bbb-3(b)(1), unless the authorization is terminated  or revoked sooner.       Influenza A by PCR NEGATIVE NEGATIVE Final   Influenza B by PCR NEGATIVE NEGATIVE Final    Comment: (NOTE) The Xpert Xpress SARS-CoV-2/FLU/RSV plus assay is  intended as an aid in the diagnosis of influenza from Nasopharyngeal swab specimens and should not be used as a sole basis for treatment. Nasal washings and aspirates are unacceptable for Xpert Xpress SARS-CoV-2/FLU/RSV testing.  Fact Sheet for Patients: EntrepreneurPulse.com.au  Fact Sheet for Healthcare Providers: IncredibleEmployment.be  This test is not yet approved or cleared by the [place] FDA and has been authorized for detection and/or diagnosis of SARS-CoV-2 by FDA under an Emergency Use Authorization (EUA). This EUA will remain in effect (meaning this test can be used) for the duration of the COVID-19 declaration under Section 564(b)(1) of the Act, 21 U.S.C. section 360bbb-3(b)(1), unless the authorization is terminated or revoked.     Resp Syncytial Virus by PCR NEGATIVE NEGATIVE Final    Comment: (NOTE) Fact Sheet for Patients: EntrepreneurPulse.com.au  Fact Sheet for Healthcare Providers: IncredibleEmployment.be  This test is not yet approved or cleared by the [place] FDA and has been authorized for detection and/or diagnosis of SARS-CoV-2 by FDA under an Emergency Use Authorization (EUA). This EUA will remain in effect (meaning this test can be used) for the duration of the COVID-19 declaration under Section  564(b)(1) of the Act, 21 U.S.C. section 360bbb-3(b)(1), unless the authorization is terminated or revoked.  Performed at [facility], [place] [address], [place], [place] [address]   MRSA Next Gen by PCR, Nasal     Status: None   Collection Time: 05/26/22  1:12 AM   Specimen: Nasal Mucosa; Nasal Swab  Result Value Ref Range Status   MRSA by PCR Next Gen NOT DETECTED NOT DETECTED Final    Comment: (NOTE) The GeneXpert MRSA Assay (FDA approved for NASAL specimens only), is one component of a comprehensive MRSA colonization surveillance program. It is not  intended to diagnose MRSA infection nor to guide or monitor treatment for MRSA infections. Test performance is not FDA approved in patients less than 36 years old. Performed at [facility], [place] [address], [place], [place] [address]      Radiology Studies: CT ANGIO GI BLEED  Result Date: 05/25/2022 CLINICAL DATA:  Vomiting blood. EXAM: CTA ABDOMEN AND PELVIS WITHOUT AND WITH CONTRAST TECHNIQUE: Multidetector CT imaging of the abdomen and pelvis was performed using the standard protocol during bolus administration of intravenous contrast. Multiplanar reconstructed images and MIPs were obtained and reviewed to evaluate the vascular anatomy. RADIATION DOSE REDUCTION: This exam was performed according to the departmental dose-optimization program which includes automated exposure control, adjustment of the mA and/or kV according to patient size and/or use of iterative reconstruction technique. CONTRAST:  187m OMNIPAQUE IOHEXOL 350 MG/ML SOLN COMPARISON:  CTA GI bleed 04/11/2022 FINDINGS: VASCULAR Aorta: Normal caliber aorta without aneurysm, dissection, vasculitis or significant stenosis. There are mild calcified atherosclerotic plaque throughout the aorta. Celiac: Patent without evidence of aneurysm, dissection, vasculitis or significant stenosis. SMA: Patent without evidence of aneurysm, dissection, vasculitis or significant stenosis. Renals: Both renal arteries are patent without evidence of aneurysm, dissection, vasculitis, fibromuscular dysplasia or significant stenosis. IMA: Patent without evidence of aneurysm, dissection, vasculitis or significant stenosis. Inflow: Patent without evidence of aneurysm, dissection, vasculitis or significant stenosis. There is calcified atherosclerotic disease present. Proximal Outflow: Bilateral common femoral and visualized portions of the superficial and profunda femoral arteries are patent without evidence of aneurysm, dissection, vasculitis or  significant stenosis. Veins: No obvious venous abnormality within the limitations of this arterial phase study. Review of the MIP images confirms the above findings. NON-VASCULAR Lower chest: No acute abnormality. Hepatobiliary: Gallstones are present. There is no biliary ductal dilatation. No focal liver lesions are seen. Pancreas: Unremarkable. No pancreatic ductal dilatation or surrounding inflammatory changes. Spleen: Normal in size without focal abnormality. Adrenals/Urinary Tract: There is a 17 mm cyst in the right kidney. Otherwise, the adrenal glands, kidneys and bladder are within normal limits. Stomach/Bowel: Stomach is within normal limits. Appendix appears normal. No evidence of bowel wall thickening, distention, or inflammatory changes. No active gastrointestinal bleeding identified. There surgical clips adjacent to the stomach, unchanged. Lymphatic: No enlarged lymph nodes are seen. Reproductive: Status post hysterectomy. No adnexal masses. Other: Scarring and calcifications are seen in the lower anterior abdominal wall, similar to prior. There is a small fat containing umbilical hernia. There is no ascites. Musculoskeletal: Minimal compression deformity of L4 is unchanged. No acute fractures are identified. IMPRESSION: 1. No evidence for active gastrointestinal bleeding. 2. No acute vascular pathology in the abdomen or pelvis. Aortic Atherosclerosis (ICD10-I70.0). NON-VASCULAR 1. Cholelithiasis. 2. Right Bosniak I benign renal cyst measuring 1.7 cm. No follow-up imaging is recommended. JACR 2018 Feb; 264-273, Management of the Incidental Renal Mass on CT, [employer] 2021; [id number], Bosniak Classification of  Cystic Renal Masses, Version 2019. Electronically Signed   By: Ronney Asters M.D.   On: 05/25/2022 22:59   DG Chest Portable 1 View  Result Date: 05/25/2022 CLINICAL DATA:  Weakness, nausea/vomiting, cough EXAM: PORTABLE CHEST 1 VIEW COMPARISON:  Chest x-ray September 14, 2020 FINDINGS: Right chest  wall port catheter in place. The cardiomediastinal silhouette is unchanged in contour, including mild cardiomegaly. No focal pulmonary opacity. No pleural effusion or pneumothorax. Partially visualized coil material projecting over the left upper abdomen. No acute osseous abnormality. IMPRESSION: 1. No acute pulmonary abnormality. 2. Unchanged cardiomegaly. Electronically Signed   By: Beryle Flock M.D.   On: 05/25/2022 19:21    Scheduled Meds:  insulin aspart  0-9 Units Subcutaneous Q4H   ipratropium-albuterol  3 mL Nebulization Q6H   sertraline  100 mg Oral Daily   Continuous Infusions:  pantoprazole 8 mg/hr (05/25/22 2257)     LOS: 1 day   Shelly Coss, MD [facility] P2/02/2023, 7:57 AM

## 2022-05-26 NOTE — H&P (View-Only) (Signed)
Referring Provider: Dr. Tawanna Solo Primary Care Physician:  Drucie Opitz, MD Primary Gastroenterologist:  Dr. Cristina Gong  Reason for Consultation:  Hematemesis  HPI: Peggy House is a 62 y.o. female with multiple medical problems as stated below had the acute onset of hematemesis at home X 2 along with black stools. Hgb 4.5 (8.6 on 04/14/22). Felt weak as well. Denies dizziness. Denies abdominal pain. 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 Colon in 02/2020 where 2 small nonbleeding AVMs were fulgurated. Hyperplastic polyps also removed. Capsule in 2019 with small bowel AVMs noted and blood seen that was thought to be from ongoing epistaxis at the time. Denies NSAIDs or alcohol.  Past Medical History:  Diagnosis Date   Anxiety    Arthritis    knnes,back   GERD (gastroesophageal reflux disease)    Hereditary hemorrhagic telangiectasia (HCC)    History of swelling of feet    Hyperlipidemia    Hypertension    Major depressive disorder, recurrent episode ([place]) 06/05/2015   Obesity    Snores    Type 2 diabetes mellitus with vascular disease ([place]) 02/26/2019  - Pulmonary Hypertension  Past Surgical History:  Procedure Laterality Date   ABDOMINAL HYSTERECTOMY     CARPAL TUNNEL RELEASE  05/13/2011   Procedure: CARPAL TUNNEL RELEASE;  Surgeon: Nita Sells, MD;  Location: [place];  Service: Orthopedics;  Laterality: Left;   COLONOSCOPY N/A 03/02/2020   Procedure: COLONOSCOPY;  Surgeon: Ronald Lobo, MD;  Location: [facility] ENDOSCOPY;  Service: Endoscopy;  Laterality: N/A;   COLONOSCOPY WITH PROPOFOL N/A 04/28/2014   Procedure: COLONOSCOPY WITH PROPOFOL;  Surgeon: Cleotis Nipper, MD;  Location[facility] ENDOSCOPY;  Service: Endoscopy;  Laterality: N/A;   DG TOES*L*  2/10   rt   DILATION AND CURETTAGE OF UTERUS     ENTEROSCOPY N/A 10/17/2017   Procedure: ENTEROSCOPY;  Surgeon: Otis Brace, MD;  Location: [place];  Service: Gastroenterology;  Laterality: N/A;   ESOPHAGOGASTRODUODENOSCOPY N/A 04/10/2014   Procedure: ESOPHAGOGASTRODUODENOSCOPY (EGD);  Surgeon: [name], MD;  Location: [facility] ENDOSCOPY;  Service: Endoscopy;  Laterality: N/A;   ESOPHAGOGASTRODUODENOSCOPY N/A 05/10/2017   Procedure: ESOPHAGOGASTRODUODENOSCOPY (EGD);  Surgeon: Ronald Lobo, MD;  Location: [facility] ENDOSCOPY;  Service: Endoscopy;  Laterality: N/A;   ESOPHAGOGASTRODUODENOSCOPY N/A 09/22/2017   Procedure: ESOPHAGOGASTRODUODENOSCOPY (EGD);  Surgeon: Clarene Essex, MD;  Location: [place];  Service: Endoscopy;  Laterality: N/A;  bedside   ESOPHAGOGASTRODUODENOSCOPY N/A 03/02/2020   Procedure: ESOPHAGOGASTRODUODENOSCOPY (EGD);  Surgeon: Ronald Lobo, MD;  Location: [facility];  Service: Endoscopy;  Laterality: N/A;   ESOPHAGOGASTRODUODENOSCOPY N/A 11/03/2020   Procedure: ESOPHAGOGASTRODUODENOSCOPY (EGD);  Surgeon: Wilford Corner, MD;  Location: [place];  Service: Endoscopy;  Laterality: N/A;   ESOPHAGOGASTRODUODENOSCOPY N/A 04/12/2022   Procedure: ESOPHAGOGASTRODUODENOSCOPY (EGD);  Surgeon: Clarene Essex, MD;  Location: [facility];  Service: Gastroenterology;  Laterality: N/A;   ESOPHAGOGASTRODUODENOSCOPY (EGD) WITH PROPOFOL N/A 04/27/2014   Procedure: ESOPHAGOGASTRODUODENOSCOPY (EGD) WITH PROPOFOL;  Surgeon: Cleotis Nipper, MD;  Location: [place];  Service: Endoscopy;  Laterality: N/A;  possible apc   ESOPHAGOGASTRODUODENOSCOPY (EGD) WITH PROPOFOL N/A 09/30/2017   Procedure: ESOPHAGOGASTRODUODENOSCOPY (EGD) WITH PROPOFOL;  Surgeon: [name], MD;  Location: [place];  Service: Gastroenterology;  Laterality: N/A;   ESOPHAGOGASTRODUODENOSCOPY (EGD) WITH PROPOFOL N/A 10/01/2017   Procedure: ESOPHAGOGASTRODUODENOSCOPY (EGD) WITH PROPOFOL;  Surgeon: [name], MD;  Location: [place];  Service: Gastroenterology;  Laterality: N/A;   ESOPHAGOGASTRODUODENOSCOPY (  EGD) WITH  PROPOFOL N/A 10/08/2017   Procedure: ESOPHAGOGASTRODUODENOSCOPY (EGD) WITH PROPOFOL;  Surgeon: Otis Brace, MD;  Location: [place];  Service: Gastroenterology;  Laterality: N/A;   ESOPHAGOGASTRODUODENOSCOPY (EGD) WITH PROPOFOL N/A 10/17/2017   Procedure: ESOPHAGOGASTRODUODENOSCOPY (EGD) WITH PROPOFOL;  Surgeon: Otis Brace, MD;  Location: [place];  Service: Gastroenterology;  Laterality: N/A;   ESOPHAGOGASTRODUODENOSCOPY (EGD) WITH PROPOFOL N/A 10/19/2017   Procedure: ESOPHAGOGASTRODUODENOSCOPY (EGD) WITH PROPOFOL;  Surgeon: Otis Brace, MD;  Location: [place];  Service: Gastroenterology;  Laterality: N/A;   ESOPHAGOGASTRODUODENOSCOPY (EGD) WITH PROPOFOL N/A 12/04/2018   Procedure: ESOPHAGOGASTRODUODENOSCOPY (EGD) WITH PROPOFOL;  Surgeon: Wilford Corner, MD;  Location: [facility];  Service: Endoscopy;  Laterality: N/A;   GIVENS CAPSULE STUDY N/A 10/02/2017   Procedure: GIVENS CAPSULE STUDY;  Surgeon: [name], MD;  Location: [place];  Service: Gastroenterology;  Laterality: N/A;   GIVENS CAPSULE STUDY N/A 10/08/2017   Procedure: GIVENS CAPSULE STUDY;  Surgeon: Otis Brace, MD;  Location: [place];  Service: Gastroenterology;  Laterality: N/A;  endoscopic placement of capsule   GIVENS CAPSULE STUDY N/A 03/02/2020   Procedure: GIVENS CAPSULE STUDY;  Surgeon: Ronald Lobo, MD;  Location: [facility];  Service: Endoscopy;  Laterality: N/A;   HEMOSTASIS CLIP PLACEMENT  12/04/2018   Procedure: HEMOSTASIS CLIP PLACEMENT;  Surgeon: Wilford Corner, MD;  Location: [facility];  Service: Endoscopy;;   HOT HEMOSTASIS N/A 04/27/2014   Procedure: HOT HEMOSTASIS (ARGON PLASMA COAGULATION/BICAP);  Surgeon: Cleotis Nipper, MD;  Location: [facility] ENDOSCOPY;  Service: Endoscopy;  Laterality: N/A;   HOT HEMOSTASIS N/A 09/30/2017   Procedure: HOT HEMOSTASIS (ARGON PLASMA COAGULATION/BICAP);  Surgeon: [name], MD;  Location: [place];  Service: Gastroenterology;   Laterality: N/A;   HOT HEMOSTASIS N/A 10/01/2017   Procedure: HOT HEMOSTASIS (ARGON PLASMA COAGULATION/BICAP);  Surgeon: [name], MD;  Location: [place];  Service: Gastroenterology;  Laterality: N/A;   HOT HEMOSTASIS N/A 10/17/2017   Procedure: HOT HEMOSTASIS (ARGON PLASMA COAGULATION/BICAP);  Surgeon: Otis Brace, MD;  Location: [facility] ENDOSCOPY;  Service: Gastroenterology;  Laterality: N/A;   HOT HEMOSTASIS N/A 10/19/2017   Procedure: HOT HEMOSTASIS (ARGON PLASMA COAGULATION/BICAP);  Surgeon: Otis Brace, MD;  Location: [facility] ENDOSCOPY;  Service: Gastroenterology;  Laterality: N/A;   HOT HEMOSTASIS N/A 03/02/2020   Procedure: HOT HEMOSTASIS (ARGON PLASMA COAGULATION/BICAP);  Surgeon: Ronald Lobo, MD;  Location[facility];  Service: Endoscopy;  Laterality: N/A;   HOT HEMOSTASIS N/A 04/12/2022   Procedure: HOT HEMOSTASIS (ARGON PLASMA COAGULATION/BICAP);  Surgeon: Clarene Essex, MD;  Location: [facility];  Service: Gastroenterology;  Laterality: N/A;   IR IMAGING GUIDED PORT INSERTION  07/08/2018   L shoulder Surgery  2011   POLYPECTOMY  03/02/2020   Procedure: POLYPECTOMY;  Surgeon: Ronald Lobo, MD;  Location: [facility]OS[facility];  Service: Endoscopy;;   SCLEROTHERAPY  11/03/2020   Procedure: Clide Deutscher;  Surgeon: Wilford Corner, MD;  Location: [facility] ENDOSCOPY;  Service: Endoscopy;;   SUBMUCOSAL INJECTION  09/22/2017   Procedure: SUBMUCOSAL INJECTION;  Surgeon: Clarene Essex, MD;  Location: [facility] ENDOSCOPY;  Service: Endoscopy;;   SUBMUCOSAL INJECTION  12/04/2018   Procedure: SUBMUCOSAL INJECTION;  Surgeon: Wilford Corner, MD;  Location: [facility] ENDOSCOPY;  Service: Endoscopy;;    Prior to Admission medications   Medication Sig Start Date End Date Taking? Authorizing Provider  ALPRAZolam Duanne Moron) 0.5 MG tablet Take 1 tablet (0.5 mg total) by mouth at bedtime as needed for anxiety or sleep. Patient taking differently: Take 0.5 mg by mouth at bedtime. 03/05/22  Yes Nooruddin, Marlene Lard, MD   Aminocaproic  Acid 1000 MG TABS TAKE 1 TABLET(1000 MG) BY MOUTH IN THE MORNING AND AT BEDTIME Patient taking differently: Take 1,000 mg by mouth in the morning and at bedtime. 02/04/22  Yes Truitt Merle, MD  amLODipine (NORVASC) 10 MG tablet Take 1 tablet (10 mg total) by mouth daily. 06/22/21  Yes [name], MD  lidocaine-prilocaine (EMLA) cream Apply 1 application topically as needed. Patient taking differently: Apply 1 application  topically as needed (for port access). 07/17/18  Yes Truitt Merle, MD  metFORMIN (GLUCOPHAGE) 500 MG tablet TAKE 1 TABLET(500 MG) BY MOUTH TWICE DAILY WITH A MEAL Patient taking differently: Take 500 mg by mouth 2 (two) times daily with a meal. 11/21/21  Yes Farrel Gordon, DO  ondansetron (ZOFRAN) 4 MG tablet Take 1 tablet (4 mg total) by mouth every 8 (eight) hours as needed for nausea or vomiting. 04/02/22  Yes Truitt Merle, MD  pantoprazole (PROTONIX) 40 MG tablet TAKE 1 TABLET(40 MG) BY MOUTH TWICE DAILY Patient taking differently: Take 40 mg by mouth 2 (two) times daily before a meal. 01/11/22  Yes Nooruddin, Marlene Lard, MD  Podiatric Products (FLEXITOL HEEL BALM) OINT Apply 1 application  topically as needed (to the feet- for dry skin). 06/10/19  Yes [provider]  PROAIR HFA 108 (90 Base) MCG/ACT inhaler INHALE 1 TO 2 PUFFS INTO THE LUNGS EVERY 6 HOURS AS NEEDED FOR WHEEZING OR SHORTNESS OF BREATH Patient taking differently: Inhale 1-2 puffs into the lungs every 6 (six) hours as needed for wheezing or shortness of breath. 11/10/20  Yes Truitt Merle, MD  sertraline (ZOLOFT) 100 MG tablet Take 1 tablet (100 mg total) by mouth daily. 11/21/21  Yes Farrel Gordon, DO  terbinafine (LAMISIL AT) 1 % cream Apply 1 application topically 2 (two) times daily. Both bottom and top of both feet and toes Patient taking differently: Apply 1 application  topically 2 (two) times daily as needed (for affected areas of both feet and toes- bottoms and tops). 06/16/20  Yes Seawell, Jaimie A, DO   traZODone (DESYREL) 100 MG tablet TAKE 1 TABLET(100 MG) BY MOUTH AT BEDTIME Patient taking differently: Take 100 mg by mouth at bedtime. 12/03/21  Yes Dean, Raquel Sarna, DO  TYLENOL PM EXTRA STRENGTH 500-25 MG TABS tablet Take 2 tablets by mouth at bedtime as needed (for sleep).   Yes [provider]  Accu-Chek Softclix Lancets lancets Use to check blood sugar before breakfast and before dinner while on steroids Patient taking differently: 1 each by Other route See admin instructions. Use to check blood sugar before breakfast and before dinner while on steroids 09/27/19   [name], MD  glucose blood (ACCU-CHEK GUIDE) test strip Check blood sugar 2 times per day while on steroids before breakfast and dinner Patient taking differently: 1 each by Other route See admin instructions. Check blood sugar 2 times per day while on steroids before breakfast and dinner 09/27/19   [name], MD    Scheduled Meds:  insulin aspart  0-9 Units Subcutaneous Q4H   ipratropium-albuterol  3 mL Nebulization Q6H   sertraline  100 mg Oral Daily   Continuous Infusions:  pantoprazole 8 mg/hr (05/26/22 1019)   potassium chloride     PRN Meds:.acetaminophen, ALPRAZolam, guaiFENesin, prochlorperazine, traZODone  Allergies as of 05/25/2022 - Review Complete 05/25/2022  Allergen Reaction Noted   Feraheme [ferumoxytol] Other (See Comments) 10/09/2017   Nsaids Other (See Comments) 04/25/2014   Tomato Hives 01/01/2012   Iron (ferrous sulfate) [ferrous sulfate er] Other (  See Comments) 03/07/2020   Wasp venom Swelling 10/06/2017    Family History  Problem Relation Age of Onset   Cancer Mother        Ovarian   Ovarian cancer Mother    Diabetes Mother    Kidney disease Mother    Bleeding Disorder Mother    Diabetes type II Sister    Bleeding Disorder Sister    Diabetes Sister    Colon cancer Maternal Grandfather    Crohn's disease Maternal Grandfather    Stomach cancer Maternal Grandmother     Kidney disease Son    Bleeding Disorder Son    Dysmenorrhea Neg Hx     Social History   Socioeconomic History   Marital status: Widowed    Spouse name: Not on file   Number of children: 3   Years of education: Not on file   Highest education level: Not on file  Occupational History   Not on file  Tobacco Use   Smoking status: Every Day    Packs/day: 0.50    Years: 30.00    Total pack years: 15.00    Types: Cigarettes   Smokeless tobacco: Former    Types: Snuff    Quit date: 58   Tobacco comments:    1/2 PPD  Vaping Use   Vaping Use: Never used  Substance and Sexual Activity   Alcohol use: No    Alcohol/week: 0.0 standard drinks of alcohol   Drug use: No    Comment: last cocaine-2010   Sexual activity: Yes    Birth control/protection: Surgical    Comment: Hysterectomy  Other Topics Concern   Not on file  Social History Narrative   Not on file   Social Determinants of Health   Financial Resource Strain: Not on file  Food Insecurity: No Food Insecurity (04/12/2022)   Hunger Vital Sign    Worried About Running Out of Food in the Last Year: Never true    Ran Out of Food in the Last Year: Never true  Transportation Needs: Unmet Transportation Needs (04/12/2022)   PRAPARE - Hydrologist (Medical): Yes    Lack of Transportation (Non-Medical): Yes  Physical Activity: Not on file  Stress: Not on file  Social Connections: Not on file  Intimate Partner Violence: Not At Risk (04/12/2022)   Humiliation, Afraid, Rape, and Kick questionnaire    Fear of Current or Ex-Partner: No    Emotionally Abused: No    Physically Abused: No    Sexually Abused: No    Review of Systems: All negative except as stated above in HPI.  Physical Exam: Vital signs: Vitals:   05/26/22 1007 05/26/22 1208  BP: (!) 141/56   Pulse: 77   Resp: 18   Temp: 98.1 F (36.7 C) 99 F (37.2 C)  SpO2: 98%    Last BM Date : 05/25/22 General:  Lethargic, obese,  no acute distress, pleasant  Head: normocephalic, atraumatic Eyes: anicteric sclera ENT: oropharynx clear Neck: supple, nontender Lungs:  Clear throughout to auscultation.   No wheezes, crackles, or rhonchi. No acute distress. Heart:  Regular rate and rhythm; no murmurs, clicks, rubs,  or gallops. Abdomen: soft, nontender, nondistended, +BS  Rectal:  Deferred Ext: no edema  GI:  Lab Results: Recent Labs    05/25/22 1936 05/26/22 0700  WBC 10.8* 11.0*  HGB 4.5* 6.1*  HCT 16.1* 19.9*  PLT 243 252   BMET Recent Labs    05/25/22 1936 05/26/22  0700  NA 138 142  K 3.5 3.4*  CL 107 110  CO2 22 23  GLUCOSE 121* 95  BUN 15 13  CREATININE 0.99 0.85  CALCIUM 8.3* 8.2*   LFT Recent Labs    05/26/22 0700  PROT 6.2*  ALBUMIN 2.9*  AST 13*  ALT 7  ALKPHOS 51  BILITOT 0.7   PT/INR Recent Labs    05/25/22 1936  LABPROT 14.9  INR 1.2     Studies/Results: CT ANGIO GI BLEED  Result Date: 05/25/2022 CLINICAL DATA:  Vomiting blood. EXAM: CTA ABDOMEN AND PELVIS WITHOUT AND WITH CONTRAST TECHNIQUE: Multidetector CT imaging of the abdomen and pelvis was performed using the standard protocol during bolus administration of intravenous contrast. Multiplanar reconstructed images and MIPs were obtained and reviewed to evaluate the vascular anatomy. RADIATION DOSE REDUCTION: This exam was performed according to the departmental dose-optimization program which includes automated exposure control, adjustment of the mA and/or kV according to patient size and/or use of iterative reconstruction technique. CONTRAST:  157m OMNIPAQUE IOHEXOL 350 MG/ML SOLN COMPARISON:  CTA GI bleed 04/11/2022 FINDINGS: VASCULAR Aorta: Normal caliber aorta without aneurysm, dissection, vasculitis or significant stenosis. There are mild calcified atherosclerotic plaque throughout the aorta. Celiac: Patent without evidence of aneurysm, dissection, vasculitis or significant stenosis. SMA: Patent without evidence of  aneurysm, dissection, vasculitis or significant stenosis. Renals: Both renal arteries are patent without evidence of aneurysm, dissection, vasculitis, fibromuscular dysplasia or significant stenosis. IMA: Patent without evidence of aneurysm, dissection, vasculitis or significant stenosis. Inflow: Patent without evidence of aneurysm, dissection, vasculitis or significant stenosis. There is calcified atherosclerotic disease present. Proximal Outflow: Bilateral common femoral and visualized portions of the superficial and profunda femoral arteries are patent without evidence of aneurysm, dissection, vasculitis or significant stenosis. Veins: No obvious venous abnormality within the limitations of this arterial phase study. Review of the MIP images confirms the above findings. NON-VASCULAR Lower chest: No acute abnormality. Hepatobiliary: Gallstones are present. There is no biliary ductal dilatation. No focal liver lesions are seen. Pancreas: Unremarkable. No pancreatic ductal dilatation or surrounding inflammatory changes. Spleen: Normal in size without focal abnormality. Adrenals/Urinary Tract: There is a 17 mm cyst in the right kidney. Otherwise, the adrenal glands, kidneys and bladder are within normal limits. Stomach/Bowel: Stomach is within normal limits. Appendix appears normal. No evidence of bowel wall thickening, distention, or inflammatory changes. No active gastrointestinal bleeding identified. There surgical clips adjacent to the stomach, unchanged. Lymphatic: No enlarged lymph nodes are seen. Reproductive: Status post hysterectomy. No adnexal masses. Other: Scarring and calcifications are seen in the lower anterior abdominal wall, similar to prior. There is a small fat containing umbilical hernia. There is no ascites. Musculoskeletal: Minimal compression deformity of L4 is unchanged. No acute fractures are identified. IMPRESSION: 1. No evidence for active gastrointestinal bleeding. 2. No acute vascular  pathology in the abdomen or pelvis. Aortic Atherosclerosis (ICD10-I70.0). NON-VASCULAR 1. Cholelithiasis. 2. Right Bosniak I benign renal cyst measuring 1.7 cm. No follow-up imaging is recommended. JACR 2018 Feb; [id number], Management of the Incidental Renal Mass on CT, [employer] 2021; [id number], Bosniak Classification of Cystic Renal Masses, Version 2019. Electronically Signed   By: ARonney AstersM.D.   On: 05/25/2022 22:59   DG Chest Portable 1 View  Result Date: 05/25/2022 CLINICAL DATA:  Weakness, nausea/vomiting, cough EXAM: PORTABLE CHEST 1 VIEW COMPARISON:  Chest x-ray September 14, 2020 FINDINGS: Right chest wall port catheter in place. The cardiomediastinal silhouette is unchanged in contour, including mild cardiomegaly. No focal pulmonary  opacity. No pleural effusion or pneumothorax. Partially visualized coil material projecting over the left upper abdomen. No acute osseous abnormality. IMPRESSION: 1. No acute pulmonary abnormality. 2. Unchanged cardiomegaly. Electronically Signed   By: Beryle Flock M.D.   On: 05/25/2022 19:21    Impression/Plan: Hematemesis with severe anemia (Hgb 4.5). S/P 2 U PRCBCs with Hgb increase to 6.1. One additional unit being transfused this morning. Question AVMs vs esophagitis vs peptic ulcer disease (less likely with EGD in late Dec). Protonix drip. Clear liquid diet. NPO p MN. Supportive care. EGD tomorrow morning by Dr. Paulita Fujita and if unrevealing may need an updated capsule endoscopy.    LOS: 1 day   [name]  05/26/2022, 12:23 PM  Questions please call [phone]

## 2022-05-27 ENCOUNTER — Inpatient Hospital Stay (HOSPITAL_COMMUNITY): Payer: Medicaid Other | Admitting: Anesthesiology

## 2022-05-27 ENCOUNTER — Encounter (HOSPITAL_COMMUNITY)
Admission: EM | Disposition: A | Discharge status: Home Health | Payer: Self-pay | Source: Home / Self Care | Attending: Internal Medicine

## 2022-05-27 DIAGNOSIS — F17200 Nicotine dependence, unspecified, uncomplicated: Secondary | ICD-10-CM | POA: Diagnosis not present

## 2022-05-27 DIAGNOSIS — K922 Gastrointestinal hemorrhage, unspecified: Secondary | ICD-10-CM | POA: Diagnosis not present

## 2022-05-27 DIAGNOSIS — Z7984 Long term (current) use of oral hypoglycemic drugs: Secondary | ICD-10-CM | POA: Diagnosis not present

## 2022-05-27 DIAGNOSIS — K921 Melena: Secondary | ICD-10-CM | POA: Diagnosis not present

## 2022-05-27 DIAGNOSIS — K31811 Angiodysplasia of stomach and duodenum with bleeding: Secondary | ICD-10-CM | POA: Diagnosis not present

## 2022-05-27 DIAGNOSIS — I10 Essential (primary) hypertension: Secondary | ICD-10-CM

## 2022-05-27 DIAGNOSIS — F17210 Nicotine dependence, cigarettes, uncomplicated: Secondary | ICD-10-CM | POA: Diagnosis not present

## 2022-05-27 DIAGNOSIS — E119 Type 2 diabetes mellitus without complications: Secondary | ICD-10-CM | POA: Diagnosis not present

## 2022-05-27 DIAGNOSIS — D62 Acute posthemorrhagic anemia: Secondary | ICD-10-CM | POA: Diagnosis not present

## 2022-05-27 DIAGNOSIS — K920 Hematemesis: Secondary | ICD-10-CM | POA: Diagnosis not present

## 2022-05-27 HISTORY — PX: HEMOSTASIS CONTROL: SHX6838

## 2022-05-27 HISTORY — PX: HEMOSTASIS CLIP PLACEMENT: SHX6857

## 2022-05-27 HISTORY — PX: ESOPHAGOGASTRODUODENOSCOPY: SHX5428

## 2022-05-27 LAB — GLUCOSE, CAPILLARY
Glucose-Capillary: 101 mg/dL — ABNORMAL HIGH (ref 70–99)
Glucose-Capillary: 111 mg/dL — ABNORMAL HIGH (ref 70–99)
Glucose-Capillary: 113 mg/dL — ABNORMAL HIGH (ref 70–99)
Glucose-Capillary: 137 mg/dL — ABNORMAL HIGH (ref 70–99)
Glucose-Capillary: 142 mg/dL — ABNORMAL HIGH (ref 70–99)
Glucose-Capillary: 97 mg/dL (ref 70–99)

## 2022-05-27 LAB — PREPARE RBC (CROSSMATCH)

## 2022-05-27 LAB — BASIC METABOLIC PANEL
Anion gap: 9 (ref 5–15)
BUN: 10 mg/dL (ref 8–23)
CO2: 23 mmol/L (ref 22–32)
Calcium: 8.2 mg/dL — ABNORMAL LOW (ref 8.9–10.3)
Chloride: 109 mmol/L (ref 98–111)
Creatinine, Ser: 0.88 mg/dL (ref 0.44–1.00)
GFR, Estimated: >60 mL/min (ref >60)
Glucose, Bld: 96 mg/dL (ref 70–99)
Potassium: 3.5 mmol/L (ref 3.5–5.1)
Sodium: 141 mmol/L (ref 135–145)

## 2022-05-27 LAB — HEMOGLOBIN AND HEMATOCRIT, BLOOD
HCT: 22.2 % — ABNORMAL LOW (ref 36.0–46.0)
Hemoglobin: 6.9 g/dL — CL (ref 12.0–15.0)

## 2022-05-27 LAB — CBC
HCT: 21.5 % — ABNORMAL LOW (ref 36.0–46.0)
Hemoglobin: 6.8 g/dL — CL (ref 12.0–15.0)
MCH: 26 pg (ref 26.0–34.0)
MCHC: 31.6 g/dL (ref 30.0–36.0)
MCV: 82.1 fL (ref 80.0–100.0)
Platelets: 229 10*3/uL (ref 150–400)
RBC: 2.62 MIL/uL — ABNORMAL LOW (ref 3.87–5.11)
RDW: 17.8 % — ABNORMAL HIGH (ref 11.5–15.5)
WBC: 9 10*3/uL (ref 4.0–10.5)
nRBC: 0.2 % (ref 0.0–0.2)

## 2022-05-27 LAB — HEMOGLOBIN A1C
Hgb A1c MFr Bld: 5 % (ref 4.8–5.6)
Mean Plasma Glucose: 96.8 mg/dL

## 2022-05-27 SURGERY — EGD (ESOPHAGOGASTRODUODENOSCOPY)
Anesthesia: Monitor Anesthesia Care

## 2022-05-27 MED ORDER — LIDOCAINE 5 % EX PTCH
2.0000 | MEDICATED_PATCH | CUTANEOUS | Status: AC
  Administered 2022-05-27: 2 via TRANSDERMAL
  Filled 2022-05-27: qty 2

## 2022-05-27 MED ORDER — OXYCODONE HCL 5 MG PO TABS
5.0000 mg | ORAL_TABLET | Freq: Once | ORAL | Status: AC
  Administered 2022-05-27: 5 mg via ORAL
  Filled 2022-05-27: qty 1

## 2022-05-27 MED ORDER — ONDANSETRON HCL 4 MG/2ML IJ SOLN
INTRAMUSCULAR | Status: DC | PRN
  Administered 2022-05-27: 4 mg via INTRAVENOUS

## 2022-05-27 MED ORDER — LIDOCAINE HCL (CARDIAC) PF 100 MG/5ML IV SOSY
PREFILLED_SYRINGE | INTRAVENOUS | Status: DC | PRN
  Administered 2022-05-27: 50 mg via INTRAVENOUS

## 2022-05-27 MED ORDER — LACTATED RINGERS IV SOLN: INTRAVENOUS | Status: DC

## 2022-05-27 MED ORDER — PROPOFOL 1000 MG/100ML IV EMUL
INTRAVENOUS | Status: AC
  Filled 2022-05-27: qty 100

## 2022-05-27 MED ORDER — PROPOFOL 10 MG/ML IV BOLUS
INTRAVENOUS | Status: DC | PRN
  Administered 2022-05-27: 150 mg via INTRAVENOUS

## 2022-05-27 MED ORDER — SODIUM CHLORIDE 0.9% IV SOLUTION: Freq: Once | INTRAVENOUS | Status: AC

## 2022-05-27 MED ORDER — IPRATROPIUM-ALBUTEROL 0.5-2.5 (3) MG/3ML IN SOLN
3.0000 mL | Freq: Two times a day (BID) | RESPIRATORY_TRACT | Status: DC
  Administered 2022-05-28 (×2): 3 mL via RESPIRATORY_TRACT
  Filled 2022-05-27 (×2): qty 3

## 2022-05-27 MED ORDER — SUCCINYLCHOLINE CHLORIDE 200 MG/10ML IV SOSY
PREFILLED_SYRINGE | INTRAVENOUS | Status: DC | PRN
  Administered 2022-05-27: 160 mg via INTRAVENOUS

## 2022-05-27 MED ORDER — SODIUM CHLORIDE 0.9% IV SOLUTION: Freq: Once | INTRAVENOUS | Status: DC

## 2022-05-27 MED ORDER — PROPOFOL 500 MG/50ML IV EMUL
INTRAVENOUS | Status: DC | PRN
  Administered 2022-05-27: 135 ug/kg/min via INTRAVENOUS
  Administered 2022-05-27: 30 mg via INTRAVENOUS

## 2022-05-27 NOTE — Progress Notes (Signed)
PROGRESS NOTE  Peggy House  [id number] DOB: Jun 21, 1960 DOA: 05/25/2022 PCP: Drucie Opitz, MD   Brief Narrative:  Patient is a 62 year old female with history of hypertension, hyperlipidemia, diabetes type 2, pulmonary hypertension, Iron deficiency anemia due to chronic blood loss on IV replacement, hereditary hemorrhagic telangiectasia, anemia secondary to chronic GI blood loss who presented to the Emergency Department with complaint of hematemesis x 2 and also 1 episode of bloody bowel movement.  Patient was recently taking ibuprofen for body aches on patient. On presentation,hemoglobin was found to be 4.5.  Patient was transfused units of 4 units PRBC during this hospitalization.  GI consulted, underwent EGD on 2/12   Assessment & Plan:  Principal Problem:   GI bleed  GI bleed/hematemesis: Presented with 2 episodes of hematemesis, 1 episode of bloody bowel movement.  Taking NSAIDs.  GI consulted.  Started on Protonix drip. Denies any nausea, vomiting or abdominal pain.  Abdomen is benign on examination.  No new episodes of hematochezia or melena since admission. She was admitted with similar scenario on 04/11/2022.  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 She has required multiple blood transfusions, APCs since 2019.  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 EGD done on 2/12 showed 2 recently bleeding angiectasia's in the stomach, status post clip placement, normal looking duodenum.  Currently on clear liquid diet.  Acute on chronic blood loss anemia: History of chronic GI bleed.  History of hereditary hemorrhagic telangiectasia.  Hemoglobin of 4.5 on admission, transfused with total of 4 units during this hospitalization.  Continue monitor H&H.  Has a port on the right chest for iron infusion.  Follows with Dr.  Burr Medico  Diabetes type 2: Currently on sliding scale insulin.  Home medications on hold.  Obesity: BMI 37.6  Prolonged QT: QTc of 510 on admission, follow-up EKG showed improvement to 470 ms  Mild leukocytosis: No infectious process.  Resolved  Chronic anxiety/depression: Home medications resumed  Obesity: BMI of 37.6       DVT prophylaxis:SCDs Start: 05/25/22 2238     Code Status: Full Code  Family Communication: None at bedside  Patient status:Inpatient  Patient is from :Home  Anticipated discharge [id number]  Estimated DC date: Likely tomorrow, needs to have a stable hemoglobin before discharge   Consultants: GI  Procedures: EGD Antimicrobials:  Anti-infectives (From admission, onward)    None       Subjective: Patient seen and examined at bedside today.  She just came from EGD.  She denies any abdominal pain, nausea or vomiting.  No bloody bowel movement or hematochezia after admission.  Looks comfortable  Objective: Vitals:   05/27/22 1100 05/27/22 1105 05/27/22 1200 05/27/22 1300  BP:  139/64 (!) 137/59 (!) 159/58  Pulse: 78 76 80 73  Resp: 20 (!) 23 19 19  $ Temp:   98.3 F (36.8 C)   TempSrc:   Oral   SpO2: 95% 94% 100% 95%  Weight:      Height:        Intake/Output Summary (Last 24 hours) at 05/27/2022 1330 Last data filed at 05/27/2022 1104 Gross per 24 hour  Intake 1297.46 ml  Output 1400 ml  Net -102.54 ml   Filed Weights   05/25/22 2102  Weight: 102.6 kg    Examination:  General exam: Overall comfortable, not in distress,obese HEENT: PERRL Respiratory  system:  no wheezes or crackles  Cardiovascular system: S1 & S2 heard, RRR.  Port on the right chest Gastrointestinal system: Abdomen is nondistended, soft and nontender. Central nervous system: Alert and oriented Extremities: No edema, no clubbing ,no cyanosis Skin: No rashes, no ulcers,no icterus     Data Reviewed: I have personally reviewed following labs and imaging  studies  CBC: Recent Labs  Lab 05/25/22 1936 05/26/22 0700 05/26/22 1400 05/26/22 1719 05/27/22 0500  WBC 10.8* 11.0*  --   --  9.0  NEUTROABS 9.5* 8.9*  --   --   --   HGB 4.5* 6.1* 7.0* 7.0* 6.8*  HCT 16.1* 19.9* 22.1* 22.0* 21.5*  MCV 82.6 82.2  --   --  82.1  PLT 243 252  --   --  [id number]   Basic Metabolic Panel: Recent Labs  Lab 05/25/22 1936 05/26/22 0700 05/27/22 0500  NA 138 142 141  K 3.5 3.4* 3.5  CL 107 110 109  CO2 22 23 23  $ GLUCOSE 121* 95 96  BUN 15 13 10  $ CREATININE 0.99 0.85 0.88  CALCIUM 8.3* 8.2* 8.2*  MG  --  2.1  --   PHOS  --  3.4  --      Recent Results (from the past 240 hour(s))  Resp panel by RT-PCR (RSV, Flu A&B, Covid) Anterior Nasal Swab     Status: None   Collection Time: 05/25/22  7:01 PM   Specimen: Anterior Nasal Swab  Result Value Ref Range Status   SARS Coronavirus 2 by RT PCR NEGATIVE NEGATIVE Final    Comment: (NOTE) SARS-CoV-2 target nucleic acids are NOT DETECTED.  The SARS-CoV-2 RNA is generally detectable in upper respiratory specimens during the acute phase of infection. The lowest concentration of SARS-CoV-2 viral copies this assay can detect is 138 copies/mL. A negative result does not preclude SARS-Cov-2 infection and should not be used as the sole basis for treatment or other patient management decisions. A negative result may occur with  improper specimen collection/handling, submission of specimen other than nasopharyngeal swab, presence of viral mutation(s) within the areas targeted by this assay, and inadequate number of viral copies(<138 copies/mL). A negative result must be combined with clinical observations, patient history, and epidemiological information. The expected result is Negative.  Fact Sheet for Patients:  EntrepreneurPulse.com.au  Fact Sheet for Healthcare Providers:  IncredibleEmployment.be  This test is no t yet approved or cleared by the [place] FDA  and  has been authorized for detection and/or diagnosis of SARS-CoV-2 by FDA under an Emergency Use Authorization (EUA). This EUA will remain  in effect (meaning this test can be used) for the duration of the COVID-19 declaration under Section 564(b)(1) of the Act, 21 U.S.C.section 360bbb-3(b)(1), unless the authorization is terminated  or revoked sooner.       Influenza A by PCR NEGATIVE NEGATIVE Final   Influenza B by PCR NEGATIVE NEGATIVE Final    Comment: (NOTE) The Xpert Xpress SARS-CoV-2/FLU/RSV plus assay is intended as an aid in the diagnosis of influenza from Nasopharyngeal swab specimens and should not be used as a sole basis for treatment. Nasal washings and aspirates are unacceptable for Xpert Xpress SARS-CoV-2/FLU/RSV testing.  Fact Sheet for Patients: EntrepreneurPulse.com.au  Fact Sheet for Healthcare Providers: IncredibleEmployment.be  This test is not yet approved or cleared by the [place] FDA and has been authorized for detection and/or diagnosis of SARS-CoV-2 by FDA under an Emergency Use Authorization (EUA). This EUA will remain in  effect (meaning this test can be used) for the duration of the COVID-19 declaration under Section 564(b)(1) of the Act, 21 U.S.C. section 360bbb-3(b)(1), unless the authorization is terminated or revoked.     Resp Syncytial Virus by PCR NEGATIVE NEGATIVE Final    Comment: (NOTE) Fact Sheet for Patients: EntrepreneurPulse.com.au  Fact Sheet for Healthcare Providers: IncredibleEmployment.be  This test is not yet approved or cleared by the [place] FDA and has been authorized for detection and/or diagnosis of SARS-CoV-2 by FDA under an Emergency Use Authorization (EUA). This EUA will remain in effect (meaning this test can be used) for the duration of the COVID-19 declaration under Section 564(b)(1) of the Act, 21 U.S.C. section 360bbb-3(b)(1),  unless the authorization is terminated or revoked.  Performed at [facility], [place] [address], [place], [place] [address]   MRSA Next Gen by PCR, Nasal     Status: None   Collection Time: 05/26/22  1:12 AM   Specimen: Nasal Mucosa; Nasal Swab  Result Value Ref Range Status   MRSA by PCR Next Gen NOT DETECTED NOT DETECTED Final    Comment: (NOTE) The GeneXpert MRSA Assay (FDA approved for NASAL specimens only), is one component of a comprehensive MRSA colonization surveillance program. It is not intended to diagnose MRSA infection nor to guide or monitor treatment for MRSA infections. Test performance is not FDA approved in patients less than 85 years old. Performed at [facility], [place] [address], [place], [place] [address]      Radiology Studies: CT ANGIO GI BLEED  Result Date: 05/25/2022 CLINICAL DATA:  Vomiting blood. EXAM: CTA ABDOMEN AND PELVIS WITHOUT AND WITH CONTRAST TECHNIQUE: Multidetector CT imaging of the abdomen and pelvis was performed using the standard protocol during bolus administration of intravenous contrast. Multiplanar reconstructed images and MIPs were obtained and reviewed to evaluate the vascular anatomy. RADIATION DOSE REDUCTION: This exam was performed according to the departmental dose-optimization program which includes automated exposure control, adjustment of the mA and/or kV according to patient size and/or use of iterative reconstruction technique. CONTRAST:  175m OMNIPAQUE IOHEXOL 350 MG/ML SOLN COMPARISON:  CTA GI bleed 04/11/2022 FINDINGS: VASCULAR Aorta: Normal caliber aorta without aneurysm, dissection, vasculitis or significant stenosis. There are mild calcified atherosclerotic plaque throughout the aorta. Celiac: Patent without evidence of aneurysm, dissection, vasculitis or significant stenosis. SMA: Patent without evidence of aneurysm, dissection, vasculitis or significant stenosis. Renals: Both renal arteries  are patent without evidence of aneurysm, dissection, vasculitis, fibromuscular dysplasia or significant stenosis. IMA: Patent without evidence of aneurysm, dissection, vasculitis or significant stenosis. Inflow: Patent without evidence of aneurysm, dissection, vasculitis or significant stenosis. There is calcified atherosclerotic disease present. Proximal Outflow: Bilateral common femoral and visualized portions of the superficial and profunda femoral arteries are patent without evidence of aneurysm, dissection, vasculitis or significant stenosis. Veins: No obvious venous abnormality within the limitations of this arterial phase study. Review of the MIP images confirms the above findings. NON-VASCULAR Lower chest: No acute abnormality. Hepatobiliary: Gallstones are present. There is no biliary ductal dilatation. No focal liver lesions are seen. Pancreas: Unremarkable. No pancreatic ductal dilatation or surrounding inflammatory changes. Spleen: Normal in size without focal abnormality. Adrenals/Urinary Tract: There is a 17 mm cyst in the right kidney. Otherwise, the adrenal glands, kidneys and bladder are within normal limits. Stomach/Bowel: Stomach is within normal limits. Appendix appears normal. No evidence of bowel wall thickening, distention, or inflammatory changes. No active gastrointestinal bleeding identified. There surgical clips adjacent to the  stomach, unchanged. Lymphatic: No enlarged lymph nodes are seen. Reproductive: Status post hysterectomy. No adnexal masses. Other: Scarring and calcifications are seen in the lower anterior abdominal wall, similar to prior. There is a small fat containing umbilical hernia. There is no ascites. Musculoskeletal: Minimal compression deformity of L4 is unchanged. No acute fractures are identified. IMPRESSION: 1. No evidence for active gastrointestinal bleeding. 2. No acute vascular pathology in the abdomen or pelvis. Aortic Atherosclerosis (ICD10-I70.0). NON-VASCULAR 1.  Cholelithiasis. 2. Right Bosniak I benign renal cyst measuring 1.7 cm. No follow-up imaging is recommended. JACR 2018 Feb; 264-273, Management of the Incidental Renal Mass on CT, [employer] 2021; 814-848, Bosniak Classification of Cystic Renal Masses, Version 2019. Electronically Signed   By: Ronney Asters M.D.   On: 05/25/2022 22:59   DG Chest Portable 1 View  Result Date: 05/25/2022 CLINICAL DATA:  Weakness, nausea/vomiting, cough EXAM: PORTABLE CHEST 1 VIEW COMPARISON:  Chest x-ray September 14, 2020 FINDINGS: Right chest wall port catheter in place. The cardiomediastinal silhouette is unchanged in contour, including mild cardiomegaly. No focal pulmonary opacity. No pleural effusion or pneumothorax. Partially visualized coil material projecting over the left upper abdomen. No acute osseous abnormality. IMPRESSION: 1. No acute pulmonary abnormality. 2. Unchanged cardiomegaly. Electronically Signed   By: Beryle Flock M.D.   On: 05/25/2022 19:21    Scheduled Meds:  sodium chloride   Intravenous Once   Chlorhexidine Gluconate Cloth  6 each Topical Daily   insulin aspart  0-9 Units Subcutaneous Q4H   ipratropium-albuterol  3 mL Nebulization Q6H   sertraline  100 mg Oral Daily   Continuous Infusions:  lactated ringers 10 mL/hr at 05/27/22 1104   pantoprazole 8 mg/hr (05/27/22 1104)     LOS: 2 days   Shelly Coss, MD [facility] P2/03/2023, 1:30 PM

## 2022-05-27 NOTE — Anesthesia Preprocedure Evaluation (Addendum)
Anesthesia Evaluation  Patient identified by MRN, date of birth, ID band Patient awake    Reviewed: Allergy & Precautions, NPO status , Patient's Chart, lab work & pertinent test results  History of Anesthesia Complications Negative for: history of anesthetic complications  Airway Mallampati: III  TM Distance: >3 FB Neck ROM: Full    Dental  (+) Dental Advisory Given, Partial Upper, Partial Lower   Pulmonary Current Smoker and Patient abstained from smoking.   Pulmonary exam normal        Cardiovascular hypertension, Pt. on medications Normal cardiovascular exam     Neuro/Psych  PSYCHIATRIC DISORDERS Anxiety Depression    negative neurological ROS     GI/Hepatic Neg liver ROS,GERD  Medicated and Controlled,,  Endo/Other  diabetes, Type 2, Oral Hypoglycemic Agents   Obesity   Renal/GU negative Renal ROS Bladder dysfunction      Musculoskeletal  (+) Arthritis ,    Abdominal   Peds  Hematology  (+) Blood dyscrasia, anemia  Hereditary hemorrhagic telangiectasia    Anesthesia Other Findings   Reproductive/Obstetrics                             Anesthesia Physical Anesthesia Plan  ASA: 3  Anesthesia Plan: MAC   Post-op Pain Management: Minimal or no pain anticipated   Induction:   PONV Risk Score and Plan: 1 and Propofol infusion and Treatment may vary due to age or medical condition  Airway Management Planned: Nasal Cannula and Natural Airway  Additional Equipment: None  Intra-op Plan:   Post-operative Plan:   Informed Consent: I have reviewed the patients History and Physical, chart, labs and discussed the procedure including the risks, benefits and alternatives for the proposed anesthesia with the patient or authorized representative who has indicated his/her understanding and acceptance.       Plan Discussed with: CRNA and Anesthesiologist  Anesthesia Plan  Comments:        Anesthesia Quick Evaluation

## 2022-05-27 NOTE — Transfer of Care (Signed)
Immediate Anesthesia Transfer of Care Note  Patient: Peggy House  Procedure(s) Performed: Procedure(s): ESOPHAGOGASTRODUODENOSCOPY (EGD) (N/A) HEMOSTASIS CLIP PLACEMENT HEMOSTASIS CONTROL  Patient Location: PACU  Anesthesia Type:General  Level of Consciousness:  sedated, patient cooperative and responds to stimulation  Airway & Oxygen Therapy:Patient Spontanous Breathing and Patient connected to face mask oxgen  Post-op Assessment:  Report given to PACU RN and Post -op Vital signs reviewed and stable  Post vital signs:  Reviewed and stable  Last Vitals:  Vitals:   05/27/22 0924 05/27/22 1022  BP: 137/81 (!) 156/77  Pulse: 81 87  Resp: 16 18  Temp: 36.7 C 36.5 C  SpO2: [id number]    Complications: No apparent anesthesia complications

## 2022-05-27 NOTE — Interval H&P Note (Signed)
History and Physical Interval Note:  05/27/2022 9:10 AM  Peggy House  has presented today for surgery, with the diagnosis of GI bleeding.  The various methods of treatment have been discussed with the patient and family. After consideration of risks, benefits and other options for treatment, the patient has consented to  Procedure(s): ESOPHAGOGASTRODUODENOSCOPY (EGD) (N/A) as a surgical intervention.  The patient's history has been reviewed, patient examined, no change in status, stable for surgery.  I have reviewed the patient's chart and labs.  Questions were answered to the patient's satisfaction.     Landry Dyke

## 2022-05-27 NOTE — Op Note (Signed)
[facility] Patient Name: Peggy House Procedure Date: 05/27/2022 MRN: [id number] Attending MD: Arta Silence , MD, [id number] Date of Birth: April 06, 1961 CSN: [id number] Age: 62 Admit Type: Inpatient Procedure:                Upper GI endoscopy Indications:              Acute post hemorrhagic anemia, Hematemesis, Melena Providers:                Arta Silence, MD, Dulcy Fanny, Darliss Cheney,                            Technician, Arnoldo Hooker, CRNA Referring MD:             [facility] Medicines:                General Anesthesia Complications:            No immediate complications. Estimated Blood Loss:     Estimated blood loss: none. Procedure:                Pre-Anesthesia Assessment:                           - Prior to the procedure, a History and Physical                            was performed, and patient medications and                            allergies were reviewed. The patient's tolerance of                            previous anesthesia was also reviewed. The risks                            and benefits of the procedure and the sedation                            options and risks were discussed with the patient.                            All questions were answered, and informed consent                            was obtained. Prior Anticoagulants: The patient has                            taken no anticoagulant or antiplatelet agents. ASA                            Grade Assessment: III - A patient with severe                            systemic disease. After reviewing the risks and  benefits, the patient was deemed in satisfactory                            condition to undergo the procedure.                           After obtaining informed consent, the endoscope was                            passed under direct vision. Throughout the                            procedure, the patient's blood pressure,  pulse, and                            oxygen saturations were monitored continuously. The                            [id number] Olympus endoscope was introduced                            through the mouth, and advanced to the second part                            of duodenum. The upper GI endoscopy was                            accomplished without difficulty. The patient                            tolerated the procedure well. Scope In: Scope Out: Findings:      The examined esophagus was normal.      Two 8 mm angioectasias, both with stigmata of recent bleeding, and both       with fresh moderately active bleedly were found in the cardia. For       hemostasis, six hemostatic clips were successfully placed. Bleeding       slowed down, but not stopped; 3 mL of pura stat gel applied, split       between both bleeding AVMs with good effect. No further bleeding noted.      The exam of the stomach was otherwise normal.      The duodenal bulb, first portion of the duodenum and second portion of       the duodenum were normal. Impression:               - Normal esophagus.                           - Two recently bleeding angioectasias in the                            stomach. Clips were placed. Pura stat gel placed.                            No further bleeding noted at end of procedure.                           -  Normal duodenal bulb, first portion of the                            duodenum and second portion of the duodenum.                           - No specimens collected. Moderate Sedation:      None Recommendation:           - Return patient to hospital ward for ongoing care.                           - Clear liquid diet today.                           - Continue present medications.                           [facility] will follow. Procedure Code(s):        --- Professional ---                           [phone], Esophagogastroduodenoscopy, flexible,                             transoral; with control of bleeding, any method Diagnosis Code(s):        --- Professional ---                           [id number], Angiodysplasia of stomach and duodenum                            with bleeding                           D62, Acute posthemorrhagic anemia                           K92.0, Hematemesis                           K92.1, Melena (includes Hematochezia) CPT copyright 2022 American Medical Association. All rights reserved. The codes documented in this report are preliminary and upon coder review may  be revised to meet current compliance requirements. Arta Silence, MD 05/27/2022 10:27:50 AM This report has been signed electronically. Number of Addenda: 0

## 2022-05-27 NOTE — Anesthesia Postprocedure Evaluation (Signed)
Anesthesia Post Note  Patient: Peggy House  Procedure(s) Performed: ESOPHAGOGASTRODUODENOSCOPY (EGD) HEMOSTASIS CLIP PLACEMENT HEMOSTASIS CONTROL     Patient location during evaluation: PACU Anesthesia Type: General Level of consciousness: awake and alert Pain management: pain level controlled Vital Signs Assessment: post-procedure vital signs reviewed and stable Respiratory status: spontaneous breathing, nonlabored ventilation, respiratory function stable and patient connected to nasal cannula oxygen Cardiovascular status: blood pressure returned to baseline and stable Postop Assessment: no apparent nausea or vomiting Anesthetic complications: no   No notable events documented.  Last Vitals:  Vitals:   05/27/22 1042 05/27/22 1105  BP: (!) 144/68 139/64  Pulse: 78 76  Resp: (!) 22 (!) 23  Temp:    SpO2: 96% 94%    Last Pain:  Vitals:   05/27/22 1105  TempSrc:   PainSc: [place] Peggy House

## 2022-05-27 NOTE — Anesthesia Procedure Notes (Signed)
Procedure Name: Intubation Date/Time: 05/27/2022 9:30 AM  Performed by: Lavina Hamman, CRNAPre-anesthesia Checklist: Patient identified, Emergency Drugs available, Suction available, Patient being monitored and Timeout performed Patient Re-evaluated:Patient Re-evaluated prior to induction Oxygen Delivery Method: Circle system utilized Preoxygenation: Pre-oxygenation with 100% oxygen Induction Type: IV induction Ventilation: Mask ventilation without difficulty Laryngoscope Size: Mac and 3 Grade View: Grade I Tube type: Oral Tube size: 7.0 mm Number of attempts: 1 Airway Equipment and Method: Stylet Placement Confirmation: ETT inserted through vocal cords under direct vision, positive ETCO2, CO2 detector and breath sounds checked- equal and bilateral Secured at: 22 cm Tube secured with: Tape Dental Injury: Teeth and Oropharynx as per pre-operative assessment  Comments: ATOI

## 2022-05-28 DIAGNOSIS — K921 Melena: Secondary | ICD-10-CM | POA: Diagnosis not present

## 2022-05-28 DIAGNOSIS — K920 Hematemesis: Secondary | ICD-10-CM | POA: Diagnosis not present

## 2022-05-28 DIAGNOSIS — K922 Gastrointestinal hemorrhage, unspecified: Secondary | ICD-10-CM | POA: Diagnosis not present

## 2022-05-28 DIAGNOSIS — D62 Acute posthemorrhagic anemia: Secondary | ICD-10-CM | POA: Diagnosis not present

## 2022-05-28 LAB — HEMOGLOBIN AND HEMATOCRIT, BLOOD
HCT: 26.8 % — ABNORMAL LOW (ref 36.0–46.0)
HCT: 29.1 % — ABNORMAL LOW (ref 36.0–46.0)
Hemoglobin: 8.6 g/dL — ABNORMAL LOW (ref 12.0–15.0)
Hemoglobin: 9.1 g/dL — ABNORMAL LOW (ref 12.0–15.0)

## 2022-05-28 LAB — BPAM RBC
Blood Product Expiration Date: 202402182359
Blood Product Expiration Date: 202402222359
Blood Product Expiration Date: 202402272359
Blood Product Expiration Date: 202402282359
Blood Product Expiration Date: 202402282359
ISSUE DATE / TIME: 202402110006
ISSUE DATE / TIME: 202402110300
ISSUE DATE / TIME: 202402110940
ISSUE DATE / TIME: 202402120636
ISSUE DATE / TIME: 202402121814
Unit Type and Rh: 600
Unit Type and Rh: 600
Unit Type and Rh: 600
Unit Type and Rh: 600
Unit Type and Rh: 600

## 2022-05-28 LAB — TYPE AND SCREEN
ABO/RH(D): A NEG
Antibody Screen: NEGATIVE
Unit division: 0
Unit division: 0
Unit division: 0
Unit division: 0
Unit division: 0

## 2022-05-28 LAB — GLUCOSE, CAPILLARY
Glucose-Capillary: 94 mg/dL (ref 70–99)
Glucose-Capillary: 94 mg/dL (ref 70–99)
Glucose-Capillary: 355 mg/dL — ABNORMAL HIGH (ref 70–99)
Glucose-Capillary: 128 mg/dL — ABNORMAL HIGH (ref 70–99)
Glucose-Capillary: 215 mg/dL — ABNORMAL HIGH (ref 70–99)

## 2022-05-28 MED ORDER — OXYCODONE HCL 5 MG PO TABS
5.0000 mg | ORAL_TABLET | Freq: Once | ORAL | Status: AC | PRN
  Administered 2022-05-28: 5 mg via ORAL
  Filled 2022-05-28: qty 1

## 2022-05-28 MED ORDER — OXYCODONE HCL 5 MG PO TABS
5.0000 mg | ORAL_TABLET | Freq: Four times a day (QID) | ORAL | Status: DC | PRN
  Administered 2022-05-28 (×2): 5 mg via ORAL
  Filled 2022-05-28 (×2): qty 1

## 2022-05-28 MED ORDER — IPRATROPIUM-ALBUTEROL 0.5-2.5 (3) MG/3ML IN SOLN: 3.0000 mL | Freq: Four times a day (QID) | RESPIRATORY_TRACT | Status: DC | PRN

## 2022-05-28 MED ORDER — PANTOPRAZOLE SODIUM 40 MG PO TBEC
40.0000 mg | DELAYED_RELEASE_TABLET | Freq: Two times a day (BID) | ORAL | Status: DC
  Administered 2022-05-28 – 2022-05-29 (×3): 40 mg via ORAL
  Filled 2022-05-28 (×3): qty 1

## 2022-05-28 NOTE — Evaluation (Signed)
Physical Therapy Evaluation Patient Details Name: Peggy House MRN: [id number] DOB: 1960/07/14 Today's Date: 05/28/2022  History of Present Illness  62 year old female with history of hypertension, hyperlipidemia, diabetes type 2, pulmonary hypertension, Iron deficiency anemia due to chronic blood loss on IV replacement, hereditary hemorrhagic telangiectasia, anemia secondary to chronic GI blood loss who presented to the Emergency Department with complaint of hematemesis x 2 and also 1 episode of bloody bowel movement.  Patient was recently taking ibuprofen for body aches on patient. On presentation,hemoglobin was found to be 4.5.  Patient was transfused units of 4 units PRBC during this hospitalization.  GI consulted, underwent EGD on 2/12 with finding of 2 recently bleeding angiectasia's  Clinical Impression  Pt admitted with above diagnosis. Pt ambulated 83' with RW without loss of balance, distance limited by 9/10 abdominal pain. Pt reports she is home alone during the day, she lives with her son and granddaughter who work and go to school. She would benefit from a home health aide.  Pt currently with functional limitations due to the deficits listed below (see PT Problem List). Pt will benefit from skilled PT to increase their independence and safety with mobility to allow discharge to the venue listed below.          Recommendations for follow up therapy are one component of a multi-disciplinary discharge planning process, led by the attending physician.  Recommendations may be updated based on patient status, additional functional criteria and insurance authorization.  Follow Up Recommendations Home health PT; home health aide      Assistance Recommended at Discharge Intermittent Supervision/Assistance  Patient can return home with the following  A little help with bathing/dressing/bathroom;Assistance with cooking/housework;Assist for transportation;Help with stairs or ramp for  entrance    Equipment Recommendations None recommended by PT  Recommendations for Other Services       Functional Status Assessment Patient has had a recent decline in their functional status and demonstrates the ability to make significant improvements in function in a reasonable and predictable amount of time.     Precautions / Restrictions Precautions Precautions: Fall Restrictions Weight Bearing Restrictions: No      Mobility  Bed Mobility               General bed mobility comments: up in recliner    Transfers Overall transfer level: Needs assistance Equipment used: Rolling walker (2 wheels) Transfers: Sit to/from Stand Sit to Stand: Min guard           General transfer comment: used momentum to power up, VCs hand placement    Ambulation/Gait Ambulation/Gait assistance: Supervision Gait Distance (Feet): 28 Feet Assistive device: Rolling walker (2 wheels) Gait Pattern/deviations: Decreased stride length, Step-through pattern Gait velocity: decr     General Gait Details: distance limited by 9/10 abdominal pain and L knee pain, no loss of balance  Stairs            Wheelchair Mobility    Modified Rankin (Stroke Patients Only)       Balance Overall balance assessment: Modified Independent                                           Pertinent Vitals/Pain Pain Assessment Pain Assessment: 0-10 Pain Score: 9  Pain Location: abdomen Pain Descriptors / Indicators: Discomfort, Aching Pain Intervention(s): Limited activity within patient's tolerance, Monitored during session, Patient requesting pain  meds-RN notified    Home Living Family/patient expects to be discharged to:: Private residence Living Arrangements: Children Available Help at Discharge: Family;Available PRN/intermittently   Home Access: Stairs to enter Entrance Stairs-Rails: Right Entrance Stairs-Number of Steps: 3   Home Layout: One level Home Equipment:  Conservation officer, nature (2 wheels) Additional Comments: lives with son and granddaughter who work and are in school during the day    Prior Function Prior Level of Function : Needs assist             Mobility Comments: walks with RW as needed, avoided tub transfers bc of fall risk so did sponge baths ADLs Comments: assist needed, was doing sponge baths     Hand Dominance        Extremity/Trunk Assessment   Upper Extremity Assessment Upper Extremity Assessment: Overall WFL for tasks assessed    Lower Extremity Assessment Lower Extremity Assessment: LLE deficits/detail LLE Deficits / Details: knee ext -4/5, 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 LLE Sensation: WNL LLE Coordination: WNL    Cervical / Trunk Assessment Cervical / Trunk Assessment: Normal  Communication   Communication: No difficulties  Cognition Arousal/Alertness: Awake/alert Behavior During Therapy: WFL for tasks assessed/performed Overall Cognitive Status: Within Functional Limits for tasks assessed                                          General Comments      Exercises     Assessment/Plan    PT Assessment Patient needs continued PT services  PT Problem List Decreased activity tolerance;Pain;Decreased mobility       PT Treatment Interventions Functional mobility training;Gait training;Therapeutic activities;Therapeutic exercise;Patient/family education    PT Goals (Current goals can be found in the Care Plan section)  Acute Rehab PT Goals Patient Stated Goal: to decrease pain PT Goal Formulation: With patient Time For Goal Achievement: 06/04/22 Potential to Achieve Goals: Good    Frequency Min 3X/week     Co-evaluation               AM-PAC PT "6 Clicks" Mobility  Outcome Measure Help needed turning from your back to your side while in a flat bed without using bedrails?: A Little Help needed moving from lying on your back to sitting on the side of a flat bed without using bedrails?:  A Little Help needed moving to and from a bed to a chair (including a wheelchair)?: A Little Help needed standing up from a chair using your arms (e.g., wheelchair or bedside chair)?: A Little Help needed to walk in hospital room?: A Little Help needed climbing 3-5 steps with a railing? : A Lot 6 Click Score: 17    End of Session Equipment Utilized During Treatment: Gait belt Activity Tolerance: Patient limited by pain Patient left: in chair;with call bell/phone within reach;with nursing/sitter in room Nurse Communication: Mobility status;Patient requests pain meds PT Visit Diagnosis: Difficulty in walking, not elsewhere classified (R26.2);Pain    Time: [id number] PT Time Calculation (min) (ACUTE ONLY): 15 min   Charges:   PT Evaluation $PT Eval Moderate Complexity: 1 Mod         Philomena Doheny PT 05/28/2022  Acute Rehabilitation Services  Office [phone]

## 2022-05-28 NOTE — Progress Notes (Signed)
Subjective: No further bleeding post-procedure.  Objective: Vital signs in last 24 hours: Temp:  [98 F (36.7 C)-98.9 F (37.2 C)] 98.1 F (36.7 C) (02/13 1036) Pulse Rate:  [61-72] 66 (02/13 1036) Resp:  [14-19] 18 (02/13 1036) BP: (105-174)/(66-97) 140/66 (02/13 1036) SpO2:  [92 %-100 %] 96 % (02/13 1036) Weight change:  Last BM Date : 05/27/22  PE: GEN:  NAD  Lab Results: CBC    Component Value Date/Time   WBC 9.0 05/27/2022 0500   RBC 2.62 (L) 05/27/2022 0500   HGB 8.6 (L) 05/28/2022 0442   HGB 6.5 (LL) 03/29/2022 1302   HGB 8.6 (L) 06/14/2020 1626   HGB 8.1 (L) 02/05/2017 1407   HCT 26.8 (L) 05/28/2022 0442   HCT 28.2 (L) 06/14/2020 1626   HCT 26.6 (L) 02/05/2017 1407   PLT 229 05/27/2022 0500   PLT 280 03/29/2022 1302   PLT 353 06/14/2020 1626   MCV 82.1 05/27/2022 0500   MCV 89 06/14/2020 1626   MCV 82.8 02/05/2017 1407   MCH 26.0 05/27/2022 0500   MCHC 31.6 05/27/2022 0500   RDW 17.8 (H) 05/27/2022 0500   RDW 18.9 (H) 06/14/2020 1626   RDW 22.6 (H) 02/05/2017 1407   LYMPHSABS 1.5 05/26/2022 0700   LYMPHSABS 2.5 05/20/2017 1551   LYMPHSABS 2.6 02/05/2017 1407   MONOABS 0.5 05/26/2022 0700   MONOABS 0.5 02/05/2017 1407   EOSABS 0.1 05/26/2022 0700   EOSABS 0.1 05/20/2017 1551   BASOSABS 0.0 05/26/2022 0700   BASOSABS 0.0 05/20/2017 1551   BASOSABS 0.1 02/05/2017 1407  CMP     Component Value Date/Time   NA 141 05/27/2022 0500   NA 147 (H) 04/02/2017 1708   NA 141 02/05/2017 1407   K 3.5 05/27/2022 0500   K 3.5 02/05/2017 1407   CL 109 05/27/2022 0500   CO2 23 05/27/2022 0500   CO2 24 02/05/2017 1407   GLUCOSE 96 05/27/2022 0500   GLUCOSE 134 02/05/2017 1407   BUN 10 05/27/2022 0500   BUN 5 (L) 04/02/2017 1708   BUN 9.3 02/05/2017 1407   CREATININE 0.88 05/27/2022 0500   CREATININE 0.77 09/13/2021 1140   CREATININE 0.9 02/05/2017 1407   CALCIUM 8.2 (L) 05/27/2022 0500   CALCIUM 8.9 02/05/2017 1407   PROT 6.2 (L) 05/26/2022 0700   PROT  6.7 04/02/2017 1708   PROT 7.1 02/05/2017 1407   ALBUMIN 2.9 (L) 05/26/2022 0700   ALBUMIN 3.8 04/02/2017 1708   ALBUMIN 3.5 02/05/2017 1407   AST 13 (L) 05/26/2022 0700   AST 12 (L) 09/13/2021 1140   AST 13 02/05/2017 1407   ALT 7 05/26/2022 0700   ALT 5 09/13/2021 1140   ALT 8 02/05/2017 1407   ALKPHOS 51 05/26/2022 0700   ALKPHOS 84 02/05/2017 1407   BILITOT 0.7 05/26/2022 0700   BILITOT 0.3 09/13/2021 1140   BILITOT 0.23 02/05/2017 1407   GFRNONAA >60 05/27/2022 0500   GFRNONAA >60 09/13/2021 1140   GFRAA >60 01/06/2020 0921   Assessment:   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  No obvious bleeding post-procedure. Acute blood loss anemia.  Plan:   Advance diet. Watch CBCs. PPI. Hopefully home tomorrow if no significant drop Hgb and no recurrent bleeding. [facility] will follow.   Landry Dyke 05/28/2022, 1:11 PM   Cell [phone] If no answer or after 5 PM call [phone]

## 2022-05-28 NOTE — TOC Progression Note (Signed)
Transition of Care [facility]) - Progression Note    Patient Details  Name: Peggy House MRN: [id number] Date of Birth: 1960/07/27  Transition of Care [facility]) CM/SW Contact  Angelita Ingles, RN Phone [phone]  05/28/2022, 3:55 PM  Clinical Narrative:     Transition of Care [facility]) Screening Note   Patient Details  Name: Peggy House Date of Birth: [birth year]   Transition of Care [facility]) CM/SW Contact:    Angelita Ingles, RN Phone Number: 05/28/2022, 3:55 PM    Transition of Care Department [facility]) has reviewed patient and no TOC needs have been identified at this time. We will continue to monitor patient advancement through interdisciplinary progression rounds. If new patient transition needs arise, please place a TOC consult.          Expected Discharge Plan and Services                                               Social Determinants of Health (SDOH) Interventions SDOH Screenings   Food Insecurity: No Food Insecurity (04/12/2022)  Housing: Low Risk  (04/12/2022)  Transportation Needs: Unmet Transportation Needs (04/12/2022)  Utilities: Not At Risk (04/12/2022)  Depression (PHQ2-9): High Risk (11/21/2021)  Tobacco Use: High Risk (05/26/2022)    Readmission Risk Interventions     No data to display

## 2022-05-28 NOTE — Progress Notes (Signed)
PROGRESS NOTE  Peggy House  [id number] DOB: 05-Dec-1960 DOA: 05/25/2022 PCP: Drucie Opitz, MD   Brief Narrative:  Patient is a 62 year old female with history of hypertension, hyperlipidemia, diabetes type 2, pulmonary hypertension, Iron deficiency anemia due to chronic blood loss on IV replacement, hereditary hemorrhagic telangiectasia, anemia secondary to chronic GI blood loss who presented to the Emergency Department with complaint of hematemesis x 2 and also 1 episode of bloody bowel movement.  Patient was recently taking ibuprofen for body aches on patient. On presentation,hemoglobin was found to be 4.5.  Patient was transfused units of 4 units PRBC during this hospitalization.  GI consulted, underwent EGD on 2/12 with finding of 2 recently bleeding angiectasia's .  Monitoring for 1 more day with soft diet before discharge plan tomorrow if Hb remains stable   Assessment & Plan:  Principal Problem:   GI bleed  GI bleed/hematemesis: Presented with 2 episodes of hematemesis, 1 episode of bloody bowel movement.  Taking NSAIDs.  GI consulted.  Started on Protonix drip. Denies any nausea, vomiting or abdominal pain.  Abdomen is benign on examination.  No new episodes of hematochezia or melena since admission. She was admitted with similar scenario on 04/11/2022.  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 She has required multiple blood transfusions, APCs since 2019.  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 EGD done on 2/12 showed 2 recently bleeding angiectasia's in the stomach, status post clip placement, normal looking duodenum.   Acute on chronic blood loss anemia: History of chronic GI bleed.  History of hereditary hemorrhagic telangiectasia.  Hemoglobin of 4.5 on admission, transfused with total of 4 units during this  hospitalization.  Continue monitor H&H.  Has a port on the right chest for iron infusion.  Follows with Dr. Burr Medico. Hemoglobin stable in the range of 8.  Check CBC tomorrow  Diabetes type 2: Currently on sliding scale insulin.  Home medications on hold.  Obesity: BMI 37.6  Prolonged QT: QTc of 510 on admission, follow-up EKG showed improvement to 470 ms  Mild leukocytosis: No infectious process.  Resolved  Chronic anxiety/depression: Home medications resumed  Obesity: BMI of 37.6  Weakness: PT consulted       DVT prophylaxis:SCDs Start: 05/25/22 2238     Code Status: Full Code  Family Communication: Called sons Audry Pili, devon on their phones, calls not received  Patient status:Inpatient  Patient is from :Home  Anticipated discharge [id number]  Estimated DC date: Likely tomorrow, monitoring CBC   antimicrobials:  Anti-infectives (From admission, onward)    None       Subjective: Patient seen and examined at bedside today.  Hemodynamically stable.  Lying in bed.  No episode  of hematochezia, hematemesis or melena.  Hemoglobin this morning in the range of 8.  Denies any upper pain, nausea or vomiting.  Objective: Vitals:   05/27/22 1100 05/27/22 1105 05/27/22 1200 05/27/22 1300  BP:  139/64 (!) 137/59 (!) 159/58  Pulse: 78 76 80 73  Resp: 20 (!) 23 19 19  $ Temp:   98.3 F (36.8 C)   TempSrc:   Oral   SpO2: 95% 94% 100% 95%  Weight:      Height:        Intake/Output Summary (Last 24 hours) at 05/27/2022 1330 Last data filed at 05/27/2022 1104 Gross per 24 hour  Intake  1297.46 ml  Output 1400 ml  Net -102.54 ml   Filed Weights   05/25/22 2102  Weight: 102.6 kg    Examination:   General exam: Overall comfortable, not in distress.  Obese HEENT: PERRL Respiratory system:  no wheezes or crackles  Cardiovascular system: S1 & S2 heard, RRR.  Port on the right chest Gastrointestinal system: Abdomen is nondistended, soft and nontender. Central nervous system:  Alert and oriented Extremities: No edema, no clubbing ,no cyanosis Skin: No rashes, no ulcers,no icterus     Data Reviewed: I have personally reviewed following labs and imaging studies  CBC: Recent Labs  Lab 05/25/22 1936 05/26/22 0700 05/26/22 1400 05/26/22 1719 05/27/22 0500  WBC 10.8* 11.0*  --   --  9.0  NEUTROABS 9.5* 8.9*  --   --   --   HGB 4.5* 6.1* 7.0* 7.0* 6.8*  HCT 16.1* 19.9* 22.1* 22.0* 21.5*  MCV 82.6 82.2  --   --  82.1  PLT 243 252  --   --  [id number]   Basic Metabolic Panel: Recent Labs  Lab 05/25/22 1936 05/26/22 0700 05/27/22 0500  NA 138 142 141  K 3.5 3.4* 3.5  CL 107 110 109  CO2 22 23 23  $ GLUCOSE 121* 95 96  BUN 15 13 10  $ CREATININE 0.99 0.85 0.88  CALCIUM 8.3* 8.2* 8.2*  MG  --  2.1  --   PHOS  --  3.4  --      Recent Results (from the past 240 hour(s))  Resp panel by RT-PCR (RSV, Flu A&B, Covid) Anterior Nasal Swab     Status: None   Collection Time: 05/25/22  7:01 PM   Specimen: Anterior Nasal Swab  Result Value Ref Range Status   SARS Coronavirus 2 by RT PCR NEGATIVE NEGATIVE Final    Comment: (NOTE) SARS-CoV-2 target nucleic acids are NOT DETECTED.  The SARS-CoV-2 RNA is generally detectable in upper respiratory specimens during the acute phase of infection. The lowest concentration of SARS-CoV-2 viral copies this assay can detect is 138 copies/mL. A negative result does not preclude SARS-Cov-2 infection and should not be used as the sole basis for treatment or other patient management decisions. A negative result may occur with  improper specimen collection/handling, submission of specimen other than nasopharyngeal swab, presence of viral mutation(s) within the areas targeted by this assay, and inadequate number of viral copies(<138 copies/mL). A negative result must be combined with clinical observations, patient history, and epidemiological information. The expected result is Negative.  Fact Sheet for Patients:   EntrepreneurPulse.com.au  Fact Sheet for Healthcare Providers:  IncredibleEmployment.be  This test is no t yet approved or cleared by the [place] FDA and  has been authorized for detection and/or diagnosis of SARS-CoV-2 by FDA under an Emergency Use Authorization (EUA). This EUA will remain  in effect (meaning this test can be used) for the duration of the COVID-19 declaration under Section 564(b)(1) of the Act, 21 U.S.C.section 360bbb-3(b)(1), unless the authorization is terminated  or revoked sooner.       Influenza A by PCR NEGATIVE NEGATIVE Final   Influenza B by PCR NEGATIVE NEGATIVE Final    Comment: (NOTE) The Xpert Xpress SARS-CoV-2/FLU/RSV plus assay is intended as an aid in the diagnosis of influenza from Nasopharyngeal swab specimens and should not be used as a sole basis for treatment. Nasal washings and aspirates are unacceptable for Xpert Xpress SARS-CoV-2/FLU/RSV testing.  Fact Sheet for Patients: EntrepreneurPulse.com.au  Fact Sheet for  Healthcare Providers: IncredibleEmployment.be  This test is not yet approved or cleared by the [place] and has been authorized for detection and/or diagnosis of SARS-CoV-2 by FDA under an Emergency Use Authorization (EUA). This EUA will remain in effect (meaning this test can be used) for the duration of the COVID-19 declaration under Section 564(b)(1) of the Act, 21 U.S.C. section 360bbb-3(b)(1), unless the authorization is terminated or revoked.     Resp Syncytial Virus by PCR NEGATIVE NEGATIVE Final    Comment: (NOTE) Fact Sheet for Patients: EntrepreneurPulse.com.au  Fact Sheet for Healthcare Providers: IncredibleEmployment.be  This test is not yet approved or cleared by the [place] FDA and has been authorized for detection and/or diagnosis of SARS-CoV-2 by FDA under an Emergency Use  Authorization (EUA). This EUA will remain in effect (meaning this test can be used) for the duration of the COVID-19 declaration under Section 564(b)(1) of the Act, 21 U.S.C. section 360bbb-3(b)(1), unless the authorization is terminated or revoked.  Performed at [facility], [place] [address], [place], [place] [address]   MRSA Next Gen by PCR, Nasal     Status: None   Collection Time: 05/26/22  1:12 AM   Specimen: Nasal Mucosa; Nasal Swab  Result Value Ref Range Status   MRSA by PCR Next Gen NOT DETECTED NOT DETECTED Final    Comment: (NOTE) The GeneXpert MRSA Assay (FDA approved for NASAL specimens only), is one component of a comprehensive MRSA colonization surveillance program. It is not intended to diagnose MRSA infection nor to guide or monitor treatment for MRSA infections. Test performance is not FDA approved in patients less than 67 years old. Performed at [facility], [place] [address] [place], [place] [address]      Radiology Studies: CT ANGIO GI BLEED  Result Date: 05/25/2022 CLINICAL DATA:  Vomiting blood. EXAM: CTA ABDOMEN AND PELVIS WITHOUT AND WITH CONTRAST TECHNIQUE: Multidetector CT imaging of the abdomen and pelvis was performed using the standard protocol during bolus administration of intravenous contrast. Multiplanar reconstructed images and MIPs were obtained and reviewed to evaluate the vascular anatomy. RADIATION DOSE REDUCTION: This exam was performed according to the departmental dose-optimization program which includes automated exposure control, adjustment of the mA and/or kV according to patient size and/or use of iterative reconstruction technique. CONTRAST:  116m OMNIPAQUE IOHEXOL 350 MG/ML SOLN COMPARISON:  CTA GI bleed 04/11/2022 FINDINGS: VASCULAR Aorta: Normal caliber aorta without aneurysm, dissection, vasculitis or significant stenosis. There are mild calcified atherosclerotic plaque throughout the aorta. Celiac:  Patent without evidence of aneurysm, dissection, vasculitis or significant stenosis. SMA: Patent without evidence of aneurysm, dissection, vasculitis or significant stenosis. Renals: Both renal arteries are patent without evidence of aneurysm, dissection, vasculitis, fibromuscular dysplasia or significant stenosis. IMA: Patent without evidence of aneurysm, dissection, vasculitis or significant stenosis. Inflow: Patent without evidence of aneurysm, dissection, vasculitis or significant stenosis. There is calcified atherosclerotic disease present. Proximal Outflow: Bilateral common femoral and visualized portions of the superficial and profunda femoral arteries are patent without evidence of aneurysm, dissection, vasculitis or significant stenosis. Veins: No obvious venous abnormality within the limitations of this arterial phase study. Review of the MIP images confirms the above findings. NON-VASCULAR Lower chest: No acute abnormality. Hepatobiliary: Gallstones are present. There is no biliary ductal dilatation. No focal liver lesions are seen. Pancreas: Unremarkable. No pancreatic ductal dilatation or surrounding inflammatory changes. Spleen: Normal in size without focal abnormality. Adrenals/Urinary Tract: There is a 17 mm cyst in the right kidney. Otherwise,  the adrenal glands, kidneys and bladder are within normal limits. Stomach/Bowel: Stomach is within normal limits. Appendix appears normal. No evidence of bowel wall thickening, distention, or inflammatory changes. No active gastrointestinal bleeding identified. There surgical clips adjacent to the stomach, unchanged. Lymphatic: No enlarged lymph nodes are seen. Reproductive: Status post hysterectomy. No adnexal masses. Other: Scarring and calcifications are seen in the lower anterior abdominal wall, similar to prior. There is a small fat containing umbilical hernia. There is no ascites. Musculoskeletal: Minimal compression deformity of L4 is unchanged. No  acute fractures are identified. IMPRESSION: 1. No evidence for active gastrointestinal bleeding. 2. No acute vascular pathology in the abdomen or pelvis. Aortic Atherosclerosis (ICD10-I70.0). NON-VASCULAR 1. Cholelithiasis. 2. Right Bosniak I benign renal cyst measuring 1.7 cm. No follow-up imaging is recommended. JACR 2018 Feb; 264-273, Management of the Incidental Renal Mass on CT, [employer] 2021; [id number], Bosniak Classification of Cystic Renal Masses, Version 2019. Electronically Signed   By: Ronney Asters M.D.   On: 05/25/2022 22:59   DG Chest Portable 1 View  Result Date: 05/25/2022 CLINICAL DATA:  Weakness, nausea/vomiting, cough EXAM: PORTABLE CHEST 1 VIEW COMPARISON:  Chest x-ray September 14, 2020 FINDINGS: Right chest wall port catheter in place. The cardiomediastinal silhouette is unchanged in contour, including mild cardiomegaly. No focal pulmonary opacity. No pleural effusion or pneumothorax. Partially visualized coil material projecting over the left upper abdomen. No acute osseous abnormality. IMPRESSION: 1. No acute pulmonary abnormality. 2. Unchanged cardiomegaly. Electronically Signed   By: Beryle Flock M.D.   On: 05/25/2022 19:21    Scheduled Meds:  sodium chloride   Intravenous Once   Chlorhexidine Gluconate Cloth  6 each Topical Daily   insulin aspart  0-9 Units Subcutaneous Q4H   ipratropium-albuterol  3 mL Nebulization Q6H   sertraline  100 mg Oral Daily   Continuous Infusions:  lactated ringers 10 mL/hr at 05/27/22 1104   pantoprazole 8 mg/hr (05/27/22 1104)     LOS: 2 days   Shelly Coss, MD [facility] P2/03/2023, 1:30 PM

## 2022-05-29 ENCOUNTER — Encounter (HOSPITAL_COMMUNITY): Payer: Self-pay | Admitting: Gastroenterology

## 2022-05-29 DIAGNOSIS — K920 Hematemesis: Secondary | ICD-10-CM | POA: Diagnosis not present

## 2022-05-29 DIAGNOSIS — K922 Gastrointestinal hemorrhage, unspecified: Secondary | ICD-10-CM | POA: Diagnosis not present

## 2022-05-29 LAB — BASIC METABOLIC PANEL
Anion gap: 10 (ref 5–15)
BUN: 9 mg/dL (ref 8–23)
CO2: 24 mmol/L (ref 22–32)
Calcium: 8.5 mg/dL — ABNORMAL LOW (ref 8.9–10.3)
Chloride: 105 mmol/L (ref 98–111)
Creatinine, Ser: 0.84 mg/dL (ref 0.44–1.00)
GFR, Estimated: >60 mL/min (ref >60)
Glucose, Bld: 105 mg/dL — ABNORMAL HIGH (ref 70–99)
Potassium: 3.7 mmol/L (ref 3.5–5.1)
Sodium: 139 mmol/L (ref 135–145)

## 2022-05-29 LAB — GLUCOSE, CAPILLARY
Glucose-Capillary: 104 mg/dL — ABNORMAL HIGH (ref 70–99)
Glucose-Capillary: 116 mg/dL — ABNORMAL HIGH (ref 70–99)
Glucose-Capillary: 134 mg/dL — ABNORMAL HIGH (ref 70–99)

## 2022-05-29 LAB — HEMOGLOBIN AND HEMATOCRIT, BLOOD
HCT: 28.8 % — ABNORMAL LOW (ref 36.0–46.0)
Hemoglobin: 8.9 g/dL — ABNORMAL LOW (ref 12.0–15.0)

## 2022-05-29 MED ORDER — PANTOPRAZOLE SODIUM 40 MG PO TBEC: 40.0000 mg | DELAYED_RELEASE_TABLET | Freq: Two times a day (BID) | ORAL | 1 refills | Status: DC

## 2022-05-29 MED ORDER — HEPARIN SOD (PORK) LOCK FLUSH 100 UNIT/ML IV SOLN
500.0000 [IU] | INTRAVENOUS | Status: DC | PRN
  Filled 2022-05-29: qty 5

## 2022-05-29 NOTE — Discharge Summary (Signed)
Physician Discharge Summary  Peggy House [id number] DOB: [birth year] DOA: 05/25/2022  PCP: Drucie Opitz, MD  Admit date: 05/25/2022 Discharge date: 05/29/2022  Admitted From: Home Disposition:  Home  Discharge Condition:Stable CODE STATUS:FULL Diet recommendation: Heart Healthy   Brief/Interim Summary: Patient is a 62 year old female with history of hypertension, hyperlipidemia, diabetes type 2, pulmonary hypertension, Iron deficiency anemia due to chronic blood loss on IV replacement, hereditary hemorrhagic telangiectasia, anemia secondary to chronic GI blood loss who presented to the Emergency Department with complaint of hematemesis x 2 and also 1 episode of bloody bowel movement.  Patient was recently taking ibuprofen for body aches on patient. On presentation,hemoglobin was found to be 4.5.  Patient was transfused units of 4 units PRBC during this hospitalization.  GI consulted, underwent EGD on 2/12 with finding of 2 recently bleeding angiectasia's .  Hemoglobin has remained stable in the range of 8-9.  She has tolerated solid diet.  GI cleared for discharge, continue Protonix on discharge.  She will follow-up with gastroenterology as an outpatient.    Following problems were addressed during the hospitalization:  GI bleed/hematemesis: Presented with 2 episodes of hematemesis, 1 episode of bloody bowel movement.  Taking NSAIDs.  GI consulted.  Started on Protonix drip. Denies any nausea, vomiting or abdominal pain.  Abdomen is benign on examination.  No new episodes of hematochezia or melena since admission. She was admitted with similar scenario on 04/11/2022.  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 She has required multiple blood transfusions, APCs since 2019.  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 EGD done on  2/12 showed 2 recently bleeding angiectasia's in the stomach, status post clip placement, normal looking duodenum.  Continue Protonix at home   Acute on chronic blood loss anemia: History of chronic GI bleed.  History of hereditary hemorrhagic telangiectasia.  Hemoglobin of 4.5 on admission, transfused with total of 4 units during this hospitalization.   Has a port on the right chest for iron infusion.  Follows with Dr. Burr Medico. Hemoglobin stable in the range of 8-9.  Check hemoglobin in a week.   Diabetes type 2: Takes metformin at home   Obesity: BMI 37.6   Prolonged QT: QTc of 510 on admission, follow-up EKG showed improvement to 470 ms   Mild leukocytosis: No infectious process.  Resolved   Chronic anxiety/depression: Home medications resumed   Obesity: BMI of 37.6   Weakness: PT consulted, recommend home health   Discharge Diagnoses:  Principal Problem:   GI bleed    Discharge Instructions  Discharge Instructions     Diet Carb Modified   Complete by: As directed    Discharge instructions   Complete by: As directed    1)Please take prescribed medications as instructed 2)Follow up with your PCP in a week and do a CBC test to check your hemoglobin. 3)Please follow up with gastroenterology as an outpatient.   Increase activity slowly   Complete by: As directed       Allergies as of 05/29/2022       Reactions   Feraheme [ferumoxytol] Other (See Comments)   Muscle tetany, encephalopathy, fatigue, nausea (reaction to injection)   Nsaids Other (See Comments)   Stomach bleeding episodes; Tylenol is OK   Tomato Hives   Iron (ferrous Sulfate) [ferrous Sulfate Er] Other (See Comments)   Shock  Wasp Venom Swelling        Medication List     TAKE these medications    Accu-Chek Guide test strip Generic drug: glucose blood Check blood sugar 2 times per day while on steroids before breakfast and dinner What changed:  how much to take how to take this when to take  this   Accu-Chek Softclix Lancets lancets Use to check blood sugar before breakfast and before dinner while on steroids What changed:  how much to take how to take this when to take this   ALPRAZolam 0.5 MG tablet Commonly known as: XANAX Take 1 tablet (0.5 mg total) by mouth at bedtime as needed for anxiety or sleep. What changed: when to take this   Aminocaproic Acid 1000 MG Tabs TAKE 1 TABLET(1000 MG) BY MOUTH IN THE MORNING AND AT BEDTIME What changed: See the new instructions.   amLODipine 10 MG tablet Commonly known as: NORVASC Take 1 tablet (10 mg total) by mouth daily.   Flexitol Heel Balm Oint Apply 1 application  topically as needed (to the feet- for dry skin).   lidocaine-prilocaine cream Commonly known as: EMLA Apply 1 application topically as needed. What changed: reasons to take this   metFORMIN 500 MG tablet Commonly known as: GLUCOPHAGE TAKE 1 TABLET(500 MG) BY MOUTH TWICE DAILY WITH A MEAL What changed:  how much to take how to take this when to take this additional instructions   ondansetron 4 MG tablet Commonly known as: Zofran Take 1 tablet (4 mg total) by mouth every 8 (eight) hours as needed for nausea or vomiting.   pantoprazole 40 MG tablet Commonly known as: PROTONIX Take 1 tablet (40 mg total) by mouth 2 (two) times daily. What changed:  how much to take how to take this when to take this additional instructions   ProAir HFA 108 (90 Base) MCG/ACT inhaler Generic drug: albuterol INHALE 1 TO 2 PUFFS INTO THE LUNGS EVERY 6 HOURS AS NEEDED FOR WHEEZING OR SHORTNESS OF BREATH What changed: See the new instructions.   sertraline 100 MG tablet Commonly known as: ZOLOFT Take 1 tablet (100 mg total) by mouth daily.   terbinafine 1 % cream Commonly known as: LamISIL AT Apply 1 application topically 2 (two) times daily. Both bottom and top of both feet and toes What changed:  when to take this reasons to take this additional  instructions   traZODone 100 MG tablet Commonly known as: DESYREL TAKE 1 TABLET(100 MG) BY MOUTH AT BEDTIME What changed:  how much to take how to take this when to take this additional instructions   Tylenol PM Extra Strength 25-500 MG Tabs tablet Generic drug: diphenhydramine-acetaminophen Take 2 tablets by mouth at bedtime as needed (for sleep).        Follow-up Information     Nooruddin, Saad, MD. Schedule an appointment as soon as possible for a visit in 1 week(s).   Contact information: [place] [address] [phone]                Allergies  Allergen Reactions   Feraheme [Ferumoxytol] Other (See Comments)    Muscle tetany, encephalopathy, fatigue, nausea (reaction to injection)   Nsaids Other (See Comments)    Stomach bleeding episodes; Tylenol is OK   Tomato Hives   Iron (Ferrous Sulfate) [Ferrous Sulfate Er] Other (See Comments)    Shock   Wasp Venom Swelling    Consultations: GI   Procedures/Studies: CT ANGIO GI BLEED  Result Date: 05/25/2022 CLINICAL DATA:  Vomiting blood. EXAM: CTA ABDOMEN AND PELVIS WITHOUT AND WITH CONTRAST TECHNIQUE: Multidetector CT imaging of the abdomen and pelvis was performed using the standard protocol during bolus administration of intravenous contrast. Multiplanar reconstructed images and MIPs were obtained and reviewed to evaluate the vascular anatomy. RADIATION DOSE REDUCTION: This exam was performed according to the departmental dose-optimization program which includes automated exposure control, adjustment of the mA and/or kV according to patient size and/or use of iterative reconstruction technique. CONTRAST:  158m OMNIPAQUE IOHEXOL 350 MG/ML SOLN COMPARISON:  CTA GI bleed 04/11/2022 FINDINGS: VASCULAR Aorta: Normal caliber aorta without aneurysm, dissection, vasculitis or significant stenosis. There are mild calcified atherosclerotic plaque throughout the aorta. Celiac: Patent without evidence of  aneurysm, dissection, vasculitis or significant stenosis. SMA: Patent without evidence of aneurysm, dissection, vasculitis or significant stenosis. Renals: Both renal arteries are patent without evidence of aneurysm, dissection, vasculitis, fibromuscular dysplasia or significant stenosis. IMA: Patent without evidence of aneurysm, dissection, vasculitis or significant stenosis. Inflow: Patent without evidence of aneurysm, dissection, vasculitis or significant stenosis. There is calcified atherosclerotic disease present. Proximal Outflow: Bilateral common femoral and visualized portions of the superficial and profunda femoral arteries are patent without evidence of aneurysm, dissection, vasculitis or significant stenosis. Veins: No obvious venous abnormality within the limitations of this arterial phase study. Review of the MIP images confirms the above findings. NON-VASCULAR Lower chest: No acute abnormality. Hepatobiliary: Gallstones are present. There is no biliary ductal dilatation. No focal liver lesions are seen. Pancreas: Unremarkable. No pancreatic ductal dilatation or surrounding inflammatory changes. Spleen: Normal in size without focal abnormality. Adrenals/Urinary Tract: There is a 17 mm cyst in the right kidney. Otherwise, the adrenal glands, kidneys and bladder are within normal limits. Stomach/Bowel: Stomach is within normal limits. Appendix appears normal. No evidence of bowel wall thickening, distention, or inflammatory changes. No active gastrointestinal bleeding identified. There surgical clips adjacent to the stomach, unchanged. Lymphatic: No enlarged lymph nodes are seen. Reproductive: Status post hysterectomy. No adnexal masses. Other: Scarring and calcifications are seen in the lower anterior abdominal wall, similar to prior. There is a small fat containing umbilical hernia. There is no ascites. Musculoskeletal: Minimal compression deformity of L4 is unchanged. No acute fractures are identified.  IMPRESSION: 1. No evidence for active gastrointestinal bleeding. 2. No acute vascular pathology in the abdomen or pelvis. Aortic Atherosclerosis (ICD10-I70.0). NON-VASCULAR 1. Cholelithiasis. 2. Right Bosniak I benign renal cyst measuring 1.7 cm. No follow-up imaging is recommended. JACR 2018 Feb; 264-273, Management of the Incidental Renal Mass on CT, [employer] 2021; 814-848, Bosniak Classification of Cystic Renal Masses, Version 2019. Electronically Signed   By: ARonney AstersM.D.   On: 05/25/2022 22:59   DG Chest Portable 1 View  Result Date: 05/25/2022 CLINICAL DATA:  Weakness, nausea/vomiting, cough EXAM: PORTABLE CHEST 1 VIEW COMPARISON:  Chest x-ray September 14, 2020 FINDINGS: Right chest wall port catheter in place. The cardiomediastinal silhouette is unchanged in contour, including mild cardiomegaly. No focal pulmonary opacity. No pleural effusion or pneumothorax. Partially visualized coil material projecting over the left upper abdomen. No acute osseous abnormality. IMPRESSION: 1. No acute pulmonary abnormality. 2. Unchanged cardiomegaly. Electronically Signed   By: MBeryle FlockM.D.   On: 05/25/2022 19:21      Subjective: Patient seen and examined at bedside today.  Hemodynamically stable.  No episodes of hematochezia or melena.  Hemoglobin stable in the 8-9.  Patient agreed to go home.  We called the son  Devone for update  from bedside but call not received  Discharge Exam: Vitals:   05/28/22 2006 05/29/22 0406  BP: 136/74 (!) 155/76  Pulse: 62 67  Resp: 14 16  Temp: 98.4 F (36.9 C) 99 F (37.2 C)  SpO2: 97% 93%   Vitals:   05/28/22 1430 05/28/22 1755 05/28/22 2006 05/29/22 0406  BP: 122/65 122/81 136/74 (!) 155/76  Pulse: (!) 55 61 62 67  Resp: 18 18 14 16  $ Temp: 97.9 F (36.6 C) 98 F (36.7 C) 98.4 F (36.9 C) 99 F (37.2 C)  TempSrc: Oral Oral Oral Oral  SpO2: 96% 100% 97% 93%  Weight:      Height:        General: Pt is alert, awake, not in acute  distress,obese Cardiovascular: RRR, S1/S2 +, no rubs, no gallops Respiratory: CTA bilaterally, no wheezing, no rhonchi Abdominal: Soft, NT, ND, bowel sounds + Extremities: no edema, no cyanosis    The results of significant diagnostics from this hospitalization (including imaging, microbiology, ancillary and laboratory) are listed below for reference.     Microbiology: Recent Results (from the past 240 hour(s))  Resp panel by RT-PCR (RSV, Flu A&B, Covid) Anterior Nasal Swab     Status: None   Collection Time: 05/25/22  7:01 PM   Specimen: Anterior Nasal Swab  Result Value Ref Range Status   SARS Coronavirus 2 by RT PCR NEGATIVE NEGATIVE Final    Comment: (NOTE) SARS-CoV-2 target nucleic acids are NOT DETECTED.  The SARS-CoV-2 RNA is generally detectable in upper respiratory specimens during the acute phase of infection. The lowest concentration of SARS-CoV-2 viral copies this assay can detect is 138 copies/mL. A negative result does not preclude SARS-Cov-2 infection and should not be used as the sole basis for treatment or other patient management decisions. A negative result may occur with  improper specimen collection/handling, submission of specimen other than nasopharyngeal swab, presence of viral mutation(s) within the areas targeted by this assay, and inadequate number of viral copies(<138 copies/mL). A negative result must be combined with clinical observations, patient history, and epidemiological information. The expected result is Negative.  Fact Sheet for Patients:  EntrepreneurPulse.com.au  Fact Sheet for Healthcare Providers:  IncredibleEmployment.be  This test is no t yet approved or cleared by the [place] FDA and  has been authorized for detection and/or diagnosis of SARS-CoV-2 by FDA under an Emergency Use Authorization (EUA). This EUA will remain  in effect (meaning this test can be used) for the duration of  the COVID-19 declaration under Section 564(b)(1) of the Act, 21 U.S.C.section 360bbb-3(b)(1), unless the authorization is terminated  or revoked sooner.       Influenza A by PCR NEGATIVE NEGATIVE Final   Influenza B by PCR NEGATIVE NEGATIVE Final    Comment: (NOTE) The Xpert Xpress SARS-CoV-2/FLU/RSV plus assay is intended as an aid in the diagnosis of influenza from Nasopharyngeal swab specimens and should not be used as a sole basis for treatment. Nasal washings and aspirates are unacceptable for Xpert Xpress SARS-CoV-2/FLU/RSV testing.  Fact Sheet for Patients: EntrepreneurPulse.com.au  Fact Sheet for Healthcare Providers: IncredibleEmployment.be  This test is not yet approved or cleared by the [place] FDA and has been authorized for detection and/or diagnosis of SARS-CoV-2 by FDA under an Emergency Use Authorization (EUA). This EUA will remain in effect (meaning this test can be used) for the duration of the COVID-19 declaration under Section 564(b)(1) of the Act, 21 U.S.C. section 360bbb-3(b)(1), unless the authorization is terminated or revoked.  Resp Syncytial Virus by PCR NEGATIVE NEGATIVE Final    Comment: (NOTE) Fact Sheet for Patients: EntrepreneurPulse.com.au  Fact Sheet for Healthcare Providers: IncredibleEmployment.be  This test is not yet approved or cleared by the [place] FDA and has been authorized for detection and/or diagnosis of SARS-CoV-2 by FDA under an Emergency Use Authorization (EUA). This EUA will remain in effect (meaning this test can be used) for the duration of the COVID-19 declaration under Section 564(b)(1) of the Act, 21 U.S.C. section 360bbb-3(b)(1), unless the authorization is terminated or revoked.  Performed at [facility], [place] [address], [place], [place] [address]   MRSA Next Gen by PCR, Nasal     Status: None   Collection  Time: 05/26/22  1:12 AM   Specimen: Nasal Mucosa; Nasal Swab  Result Value Ref Range Status   MRSA by PCR Next Gen NOT DETECTED NOT DETECTED Final    Comment: (NOTE) The GeneXpert MRSA Assay (FDA approved for NASAL specimens only), is one component of a comprehensive MRSA colonization surveillance program. It is not intended to diagnose MRSA infection nor to guide or monitor treatment for MRSA infections. Test performance is not FDA approved in patients less than 68 years old. Performed at [facility], [address], [place], [place] [address]      Labs: BNP (last 3 results) No results for input(s): "BNP" in the last 8760 hours. Basic Metabolic Panel: Recent Labs  Lab 05/25/22 1936 05/26/22 0700 05/27/22 0500 05/29/22 0620  NA 138 142 141 139  K 3.5 3.4* 3.5 3.7  CL 107 110 109 105  CO2 22 23 23 24  $ GLUCOSE 121* 95 96 105*  BUN 15 13 10 9  $ CREATININE 0.99 0.85 0.88 0.84  CALCIUM 8.3* 8.2* 8.2* 8.5*  MG  --  2.1  --   --   PHOS  --  3.4  --   --    Liver Function Tests: Recent Labs  Lab 05/25/22 1936 05/26/22 0700  AST 12* 13*  ALT 8 7  ALKPHOS 54 51  BILITOT 0.4 0.7  PROT 6.6 6.2*  ALBUMIN 3.1* 2.9*   Recent Labs  Lab 05/25/22 1936  LIPASE 28   No results for input(s): "AMMONIA" in the last 168 hours. CBC: Recent Labs  Lab 05/25/22 1936 05/26/22 0700 05/26/22 1400 05/27/22 0500 05/27/22 1541 05/28/22 0442 05/28/22 1700 05/29/22 0620  WBC 10.8* 11.0*  --  9.0  --   --   --   --   NEUTROABS 9.5* 8.9*  --   --   --   --   --   --   HGB 4.5* 6.1*   < > 6.8* 6.9* 8.6* 9.1* 8.9*  HCT 16.1* 19.9*   < > 21.5* 22.2* 26.8* 29.1* 28.8*  MCV 82.6 82.2  --  82.1  --   --   --   --   PLT 243 252  --  229  --   --   --   --    < > = values in this interval not displayed.   Cardiac Enzymes: No results for input(s): "CKTOTAL", "CKMB", "CKMBINDEX", "TROPONINI" in the last 168 hours. BNP: Invalid input(s): "POCBNP" CBG: Recent Labs  Lab  05/28/22 1601 05/28/22 2003 05/29/22 0001 05/29/22 0402 05/29/22 0758  GLUCAP 128* 215* 134* 116* 104*   D-Dimer No results for input(s): "DDIMER" in the last 72 hours. Hgb A1c No results for input(s): "HGBA1C" in the last 72 hours. Lipid  Profile No results for input(s): "CHOL", "HDL", "LDLCALC", "TRIG", "CHOLHDL", "LDLDIRECT" in the last 72 hours. Thyroid function studies No results for input(s): "TSH", "T4TOTAL", "T3FREE", "THYROIDAB" in the last 72 hours.  Invalid input(s): "FREET3" Anemia work up No results for input(s): "VITAMINB12", "FOLATE", "FERRITIN", "TIBC", "IRON", "RETICCTPCT" in the last 72 hours. Urinalysis    Component Value Date/Time   COLORURINE YELLOW 09/28/2020 1330   APPEARANCEUR CLEAR 09/28/2020 1330   LABSPEC 1.025 09/28/2020 1330   PHURINE 6.0 09/28/2020 1330   GLUCOSEU NEGATIVE 09/28/2020 1330   HGBUR NEGATIVE 09/28/2020 1330   BILIRUBINUR NEGATIVE 09/28/2020 1330   KETONESUR NEGATIVE 09/28/2020 1330   PROTEINUR 30 (A) 03/29/2022 1327   UROBILINOGEN 1.0 08/24/2013 2121   NITRITE NEGATIVE 09/28/2020 1330   LEUKOCYTESUR NEGATIVE 09/28/2020 1330   Sepsis Labs Recent Labs  Lab 05/25/22 1936 05/26/22 0700 05/27/22 0500  WBC 10.8* 11.0* 9.0   Microbiology Recent Results (from the past 240 hour(s))  Resp panel by RT-PCR (RSV, Flu A&B, Covid) Anterior Nasal Swab     Status: None   Collection Time: 05/25/22  7:01 PM   Specimen: Anterior Nasal Swab  Result Value Ref Range Status   SARS Coronavirus 2 by RT PCR NEGATIVE NEGATIVE Final    Comment: (NOTE) SARS-CoV-2 target nucleic acids are NOT DETECTED.  The SARS-CoV-2 RNA is generally detectable in upper respiratory specimens during the acute phase of infection. The lowest concentration of SARS-CoV-2 viral copies this assay can detect is 138 copies/mL. A negative result does not preclude SARS-Cov-2 infection and should not be used as the sole basis for treatment or other patient management  decisions. A negative result may occur with  improper specimen collection/handling, submission of specimen other than nasopharyngeal swab, presence of viral mutation(s) within the areas targeted by this assay, and inadequate number of viral copies(<138 copies/mL). A negative result must be combined with clinical observations, patient history, and epidemiological information. The expected result is Negative.  Fact Sheet for Patients:  EntrepreneurPulse.com.au  Fact Sheet for Healthcare Providers:  IncredibleEmployment.be  This test is no t yet approved or cleared by the [place] FDA and  has been authorized for detection and/or diagnosis of SARS-CoV-2 by FDA under an Emergency Use Authorization (EUA). This EUA will remain  in effect (meaning this test can be used) for the duration of the COVID-19 declaration under Section 564(b)(1) of the Act, 21 U.S.C.section 360bbb-3(b)(1), unless the authorization is terminated  or revoked sooner.       Influenza A by PCR NEGATIVE NEGATIVE Final   Influenza B by PCR NEGATIVE NEGATIVE Final    Comment: (NOTE) The Xpert Xpress SARS-CoV-2/FLU/RSV plus assay is intended as an aid in the diagnosis of influenza from Nasopharyngeal swab specimens and should not be used as a sole basis for treatment. Nasal washings and aspirates are unacceptable for Xpert Xpress SARS-CoV-2/FLU/RSV testing.  Fact Sheet for Patients: EntrepreneurPulse.com.au  Fact Sheet for Healthcare Providers: IncredibleEmployment.be  This test is not yet approved or cleared by the [place] FDA and has been authorized for detection and/or diagnosis of SARS-CoV-2 by FDA under an Emergency Use Authorization (EUA). This EUA will remain in effect (meaning this test can be used) for the duration of the COVID-19 declaration under Section 564(b)(1) of the Act, 21 U.S.C. section 360bbb-3(b)(1), unless the  authorization is terminated or revoked.     Resp Syncytial Virus by PCR NEGATIVE NEGATIVE Final    Comment: (NOTE) Fact Sheet for Patients: EntrepreneurPulse.com.au  Fact Sheet for Healthcare Providers:  IncredibleEmployment.be  This test is not yet approved or cleared by the [place] and has been authorized for detection and/or diagnosis of SARS-CoV-2 by FDA under an Emergency Use Authorization (EUA). This EUA will remain in effect (meaning this test can be used) for the duration of the COVID-19 declaration under Section 564(b)(1) of the Act, 21 U.S.C. section 360bbb-3(b)(1), unless the authorization is terminated or revoked.  Performed at [facility], [place] [address] [place], [place] [address]   MRSA Next Gen by PCR, Nasal     Status: None   Collection Time: 05/26/22  1:12 AM   Specimen: Nasal Mucosa; Nasal Swab  Result Value Ref Range Status   MRSA by PCR Next Gen NOT DETECTED NOT DETECTED Final    Comment: (NOTE) The GeneXpert MRSA Assay (FDA approved for NASAL specimens only), is one component of a comprehensive MRSA colonization surveillance program. It is not intended to diagnose MRSA infection nor to guide or monitor treatment for MRSA infections. Test performance is not FDA approved in patients less than 2 years old. Performed at [facility], [place] [address], [place], [place] [address]     Please note: You were cared for by a hospitalist during your hospital stay. Once you are discharged, your primary care physician will handle any further medical issues. Please note that NO REFILLS for any discharge medications will be authorized once you are discharged, as it is imperative that you return to your primary care physician (or establish a relationship with a primary care physician if you do not have one) for your post hospital discharge needs so that they can reassess your need for  medications and monitor your lab values.    Time coordinating discharge: 40 minutes  SIGNED:   Shelly Coss, MD  [facility] 05/29/2022, 9:48 AM Pager [phone]  If 7PM-7AM, please contact night-coverage [email or web address] Password [email or web address]

## 2022-05-29 NOTE — TOC Progression Note (Signed)
Transition of Care [facility]) - Progression Note    Patient Details  Name: Peggy House MRN: [id number] Date of Birth: [birth year]  Transition of Care [facility]) CM/SW [place], RN Phone [phone]  05/29/2022, 11:09 AM  Clinical Narrative:    TOC consulted for home health. Cm at bedside to make patient aware that [facility] is unable to find agency to accept for [facility]. Patient states that she does not have a ride for outpatient therapy. Patient states she will follow up with [facility] for persona care services. Patient verbalizes understanding that therapy has not been set up. MD updated. No other TOC needs noted at this time. TOC will sign off.         Expected Discharge Plan and Services         Expected Discharge Date: 05/29/22                                     Social Determinants of Health (SDOH) Interventions SDOH Screenings   Food Insecurity: No Food Insecurity (04/12/2022)  Housing: Low Risk  (04/12/2022)  Transportation Needs: Unmet Transportation Needs (04/12/2022)  Utilities: Not At Risk (04/12/2022)  Depression (PHQ2-9): High Risk (11/21/2021)  Tobacco Use: High Risk (05/26/2022)    Readmission Risk Interventions     No data to display

## 2022-05-30 ENCOUNTER — Telehealth: Payer: Self-pay

## 2022-05-30 NOTE — Transitions of Care (Post Inpatient/ED Visit) (Signed)
   05/30/2022  Name: GENESI STEFANKO MRN: [id number] DOB: 1960-10-16  Today's TOC FU Call Status: Today's TOC FU Call Status:: Unsuccessul Call (1st Attempt) Unsuccessful Call (1st Attempt) Date: 05/30/22  Attempted to reach the patient regarding the most recent Inpatient/ED visit.  Follow Up Plan: Additional outreach attempts will be made to reach the patient to complete the Transitions of Care (Post Inpatient/ED visit) call.   Signature Juanda Crumble, [place] Direct Dial [phone]

## 2022-05-31 NOTE — Transitions of Care (Post Inpatient/ED Visit) (Unsigned)
   05/31/2022  Name: Peggy House MRN: [id number] DOB: 04/19/1960  Today's TOC FU Call Status: Today's TOC FU Call Status:: Unsuccessful Call (2nd Attempt) Unsuccessful Call (1st Attempt) Date: 05/30/22 Unsuccessful Call (2nd Attempt) Date: 05/31/22  Attempted to reach the patient regarding the most recent Inpatient/ED visit.  Follow Up Plan: Additional outreach attempts will be made to reach the patient to complete the Transitions of Care (Post Inpatient/ED visit) call.   Signature Juanda Crumble, [place] Direct Dial [phone]

## 2022-06-03 NOTE — Transitions of Care (Post Inpatient/ED Visit) (Signed)
   06/03/2022  Name: Peggy House MRN: [id number] DOB: [birth year]  Today's TOC FU Call Status: Today's TOC FU Call Status:: Successful TOC FU Call Competed Unsuccessful Call (1st Attempt) Date: 05/30/22 Unsuccessful Call (2nd Attempt) Date: 05/31/22 [facility] FU Call Complete Date: 06/03/22  Transition Care Management Follow-up Telephone Call Date of Discharge: 05/29/22 Discharge Facility: [facility]) Type of Discharge: Inpatient Admission Primary Inpatient Discharge Diagnosis:: hematemesis How have you been since you were released from the hospital?: Better Any questions or concerns?: No  Items Reviewed: Did you receive and understand the discharge instructions provided?: Yes Medications obtained and verified?: Yes (Medications Reviewed) Any new allergies since your discharge?: No Dietary orders reviewed?: Yes Do you have support at home?: Yes People in Home: child(ren), adult  Home Care and Equipment/Supplies: [place] Ordered?: NA Any new equipment or medical supplies ordered?: NA  Functional Questionnaire: Do you need assistance with bathing/showering or dressing?: No Do you need assistance with meal preparation?: No Do you need assistance with eating?: No Do you have difficulty maintaining continence: No Do you need assistance with getting out of bed/getting out of a chair/moving?: No  Folllow up appointments reviewed: PCP Follow-up appointment confirmed?: No (no avail appts, sent message to staff to schedule) MD Provider Line Number:[phone] Given: No [facility] Follow-up appointment confirmed?: NA Do you need transportation to your follow-up appointment?: No Do you understand care options if your condition(s) worsen?: Yes-patient verbalized understanding    [place], [place] Nurse Health Advisor Direct Dial [phone]

## 2022-06-11 ENCOUNTER — Other Ambulatory Visit: Payer: Self-pay | Admitting: Student

## 2022-06-11 ENCOUNTER — Other Ambulatory Visit: Payer: Self-pay | Admitting: Hematology

## 2022-06-11 DIAGNOSIS — I10 Essential (primary) hypertension: Secondary | ICD-10-CM

## 2022-06-12 ENCOUNTER — Other Ambulatory Visit: Payer: Self-pay | Admitting: Hematology

## 2022-06-12 DIAGNOSIS — I10 Essential (primary) hypertension: Secondary | ICD-10-CM

## 2022-06-12 NOTE — Telephone Encounter (Signed)
Last appointment 12/20/2021.  No future appointments scheduled.

## 2022-06-13 ENCOUNTER — Telehealth: Payer: Self-pay

## 2022-06-13 NOTE — Telephone Encounter (Signed)
Patient called she wants to know if she can take omega xl for her arthritis. Please return patients call.

## 2022-06-17 ENCOUNTER — Telehealth: Payer: Self-pay | Admitting: Student

## 2022-06-17 NOTE — Telephone Encounter (Signed)
Per 3/4 IB reached out to schedule patient , left voicemail.

## 2022-06-18 ENCOUNTER — Telehealth: Payer: Self-pay

## 2022-06-18 NOTE — Telephone Encounter (Signed)
The pt called wanting to know her upcoming appointments day and time. I was able to verbalize her schedule to her and  she was able to verbalize day and time back to me. Pt also stated that she had been calling her social worker to set up transportation but can't get through. I advise the pt to get up early in the morning to make the phone call, because she had a better chance of someone answering versus her calling late in the evening. The pt said she will call in the morning.     Tula Nakayama., CMA

## 2022-06-20 ENCOUNTER — Ambulatory Visit: Payer: Medicaid Other | Admitting: Student

## 2022-06-20 ENCOUNTER — Encounter: Payer: Self-pay | Admitting: Student

## 2022-06-20 VITALS — BP 158/86 | HR 81 | Temp 98.5°F | Ht 65.0 in | Wt 226.6 lb

## 2022-06-20 DIAGNOSIS — F418 Other specified anxiety disorders: Secondary | ICD-10-CM

## 2022-06-20 DIAGNOSIS — I10 Essential (primary) hypertension: Secondary | ICD-10-CM | POA: Diagnosis not present

## 2022-06-20 DIAGNOSIS — I780 Hereditary hemorrhagic telangiectasia: Secondary | ICD-10-CM

## 2022-06-20 DIAGNOSIS — F17210 Nicotine dependence, cigarettes, uncomplicated: Secondary | ICD-10-CM | POA: Diagnosis not present

## 2022-06-20 DIAGNOSIS — F331 Major depressive disorder, recurrent, moderate: Secondary | ICD-10-CM

## 2022-06-20 DIAGNOSIS — F17200 Nicotine dependence, unspecified, uncomplicated: Secondary | ICD-10-CM

## 2022-06-20 MED ORDER — NICOTINE 14 MG/24HR TD PT24: 14.0000 mg | MEDICATED_PATCH | TRANSDERMAL | 3 refills | Status: AC

## 2022-06-20 MED ORDER — CITALOPRAM HYDROBROMIDE 20 MG PO TABS: 20.0000 mg | ORAL_TABLET | Freq: Every day | ORAL | 3 refills | Status: DC

## 2022-06-20 MED ORDER — TRAZODONE HCL 100 MG PO TABS: ORAL_TABLET | ORAL | 2 refills | Status: DC

## 2022-06-20 MED ORDER — LISINOPRIL 20 MG PO TABS: 20.0000 mg | ORAL_TABLET | Freq: Every day | ORAL | 3 refills | Status: DC

## 2022-06-20 NOTE — Patient Instructions (Addendum)
Thank you so much for coming to the clinic today!   Please stop taking the Zoloft, I am switching you to Celexa. Please continue taking your amlodipine and I added lisinopril to help control your blood pressure. Please start checking it at home. I would like to see you back in one month to check how the new depression medicine is working as well as for some blood work.   If you have any questions please feel free to the call the clinic at anytime at [phone]. It was a pleasure seeing you!  Best, Dr. Sanjuana Mae

## 2022-06-22 DIAGNOSIS — F17200 Nicotine dependence, unspecified, uncomplicated: Secondary | ICD-10-CM | POA: Insufficient documentation

## 2022-06-22 NOTE — Assessment & Plan Note (Signed)
Blood pressure today in clinic at 174/79 initially, then on recheck was 158/69. She denies any chest pain, dizziness, or blurry vision. Her current regimen is only amlodipine '10mg'$ . From chart review, seems like her regimen was supposed to also include lisinopril '40mg'$  and coreg '25mg'$  BID. She is unsure when and why these were discontinued, and unable to find documentation as to why. She does not check her blood pressure at home.   Plan:  - Restart lisinopril at 20 mg  - Follow up in one month for BMP

## 2022-06-22 NOTE — Assessment & Plan Note (Signed)
Pt with history of hereditary hemorrhagic telangiectasia who presents as a hospital follow up for acute GI bleeding and acute symptomatic anemia. She was found to have a Hb level of 4.5. HHT is being managed by Dr. Burr Medico of oncology. Since her hospitalization, she states she is doing well, with no complaints of hematemesis, hematochezia, dizziness, or melena. Her next oncology appointment is on 06/26/22.

## 2022-06-22 NOTE — Progress Notes (Signed)
CC: Hospital follow up  HPI:  [name] is a 62 y.o. female living with a history stated below and presents today for hospital follow up. Please see problem based assessment and plan for additional details.  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 Use to check blood sugar before breakfast and before dinner while on steroids) 100 each 1   ALPRAZolam (XANAX) 0.5 MG tablet TAKE 1 TABLET BY MOUTH AT BEDTIME AS NEEDED FOR ANXIETY OR SLEEP 30 tablet 0   Aminocaproic Acid 1000 MG TABS TAKE 1 TABLET(1000 MG) BY MOUTH IN THE MORNING AND AT BEDTIME (Patient taking differently: Take 1,000 mg by mouth in the morning and at bedtime.) 60 tablet 2   amLODipine (NORVASC) 10 MG tablet TAKE 1 TABLET(10 MG) BY MOUTH DAILY 90 tablet 3   glucose blood (ACCU-CHEK GUIDE) test strip Check blood sugar 2 times per day while on steroids before breakfast and dinner (Patient taking differently: 1 each by Other route See admin instructions. Check blood sugar 2 times per day while on steroids before breakfast and dinner) 50 each 3   lidocaine-prilocaine (EMLA) cream Apply 1 application topically as needed. (Patient taking differently: Apply 1 application  topically as needed (for port access).) 30 g 0   metFORMIN (GLUCOPHAGE) 500 MG tablet TAKE 1 TABLET(500 MG) BY MOUTH TWICE DAILY WITH A  MEAL (Patient taking differently: Take 500 mg by mouth 2 (two) times daily with a meal.) 180 tablet 3   ondansetron (ZOFRAN) 4 MG tablet Take 1 tablet (4 mg total) by mouth every 8 (eight) hours as needed for nausea or vomiting. 20 tablet 0   pantoprazole (PROTONIX) 40 MG tablet Take 1 tablet (40 mg total) by mouth 2 (two) times daily. 60 tablet 1   Podiatric Products (FLEXITOL HEEL BALM) OINT Apply 1 application  topically as needed (to the feet- for dry skin).     PROAIR HFA 108 (90 Base) MCG/ACT inhaler INHALE 1 TO 2 PUFFS INTO THE LUNGS EVERY 6 HOURS AS NEEDED FOR WHEEZING OR SHORTNESS OF BREATH (Patient taking differently: Inhale 1-2 puffs into the lungs every 6 (six) hours as needed for wheezing or shortness of breath.) 8.5 g 0   terbinafine (LAMISIL AT) 1 % cream Apply 1 application topically 2 (two) times daily. Both bottom and top of both feet and toes (Patient taking differently: Apply 1 application  topically 2 (two) times daily as needed (for affected areas of both feet and toes- bottoms and tops).) 36 g 0   TYLENOL PM EXTRA STRENGTH 500-25 MG TABS tablet Take 2 tablets by mouth at bedtime as needed (for sleep).     Current Facility-Administered Medications on File Prior to Visit  Medication Dose Route Frequency Provider Last Rate Last Admin   diphenhydrAMINE (BENADRYL) 25 mg capsule  diphenhydrAMINE (BENADRYL) 25 mg capsule            phenylephrine (NEO-SYNEPHRINE) 1 % nasal drops 2 drop  2 drop Left Nare Once Truitt Merle, MD        Family History  Problem Relation Age of Onset   Cancer Mother        Ovarian   Ovarian cancer Mother    Diabetes Mother    Kidney disease Mother    Bleeding Disorder Mother    Diabetes type II Sister    Bleeding Disorder Sister    Diabetes Sister    Colon cancer Maternal Grandfather    Crohn's disease Maternal Grandfather    Stomach cancer Maternal Grandmother    Kidney disease Son    Bleeding Disorder Son    Dysmenorrhea Neg Hx      Social History   Socioeconomic History   Marital status: Widowed    Spouse name: Not on file   Number of children: 3   Years of education: Not on file   Highest education level: Not on file  Occupational History   Not on file  Tobacco Use   Smoking status: Every Day    Packs/day: 0.50    Years: 30.00    Total pack years: 15.00    Types: Cigarettes   Smokeless tobacco: Former    Types: Snuff    Quit date: 52   Tobacco comments:    1/2 PPD  Vaping Use   Vaping Use: Never used  Substance and Sexual Activity   Alcohol use: No    Alcohol/week: 0.0 standard drinks of alcohol   Drug use: No    Comment: last cocaine-2010   Sexual activity: Yes    Birth control/protection: Surgical    Comment: Hysterectomy  Other Topics Concern   Not on file  Social History Narrative   Not on file   Social Determinants of Health   Financial Resource Strain: Not on file  Food Insecurity: No Food Insecurity (04/12/2022)   Hunger Vital Sign    Worried About Running Out of Food in the Last Year: Never true    Ran Out of Food in the Last Year: Never true  Transportation Needs: Unmet Transportation Needs (04/12/2022)   PRAPARE - Hydrologist (Medical): Yes    Lack of Transportation (Non-Medical): Yes  Physical Activity: Not on file  Stress: Not on file  Social Connections: Not on file  Intimate Partner Violence: Not At Risk (04/12/2022)   Humiliation, Afraid, Rape, and Kick questionnaire    Fear of Current or Ex-Partner: No    Emotionally Abused: No    Physically Abused: No    Sexually Abused: No    Review of Systems: ROS negative except for what is noted on the assessment and plan.  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 She was found to have a Hb level of 4.5. HHT is being managed by Dr. Burr Medico of oncology. Since her hospitalization, she states she is doing well, with no complaints of hematemesis, hematochezia, dizziness, or melena. Her next oncology appointment is on 06/26/22.   Essential hypertension Blood pressure today in clinic at 174/79 initially, then on recheck was 158/69. She denies any chest pain, dizziness, or blurry vision. Her current regimen is only amlodipine '10mg'$ . From chart review, seems like her regimen was supposed to also include lisinopril '40mg'$  and coreg '25mg'$  BID. She is unsure when and why these were discontinued, and unable to find documentation as to why. She does not check her blood pressure at home.   Plan:  - Restart lisinopril at 20 mg  - Follow up in one month for BMP   Depression with anxiety Will refill celexa and trazodone  Tobacco use disorder Pt with long term use of tobacco use (15 pack year), and is now interested in quitting. I was able to counsel her on setting a stop date, and went over multiple different ways to help her quit, including chantix and nicotine patches. She is amenable to nicotine patches. Will check back at follow up.   Patient discussed with Dr. Charissa Bash Alegandra Sommers, M.D. [facility], PGY-1 Pager: [phone] Date 06/22/2022 Time 7:54 PM

## 2022-06-22 NOTE — Assessment & Plan Note (Signed)
Pt with long term use of tobacco use (15 pack year), and is now interested in quitting. I was able to counsel her on setting a stop date, and went over multiple different ways to help her quit, including chantix and nicotine patches. She is amenable to nicotine patches. Will check back at follow up.

## 2022-06-22 NOTE — Assessment & Plan Note (Signed)
Will refill celexa and trazodone

## 2022-06-24 NOTE — Progress Notes (Signed)
Internal Medicine Clinic Attending  Case discussed with Dr. Nooruddin  At the time of the visit.  We reviewed the resident's history and exam and pertinent patient test results.  I agree with the assessment, diagnosis, and plan of care documented in the resident's note.  

## 2022-06-25 NOTE — Progress Notes (Deleted)
Patient Care Team: Drucie Opitz, MD as PCP - Charissa Bash, MD as Attending Physician (Hematology and Oncology)   CHIEF COMPLAINT: Follow up [place] and anemia, secondary to blood loss and iron deficiency   CURRENT THERAPY:  -Blood transfusion for Hg<8.0 (2units on different days if Hg<7) -iv ferric gluconate, with ferritin goal 100-200.  -Avastin/Zirabev restarted on 04/03/18. Currently given PRN due to proteinuria. -Amicar 1g BID, intermittently since 07/17/18  INTERVAL HISTORY [name] returns for follow up. Last seen by Dr. Burr Medico 03/29/22 and received Beva and Ferrlecit, then Ferrlecit again on 12/29.  She missed infusion appointments with [place] on 04/22/2022 and 05/24/2022.  She was recently hospitalized on 2/10 for hematemesis, Hgb dropped to 4.5; he received a total of 5 units on 2/11 and 2/12.  UGI by Dr. Paulita Fujita showed 2 recently bleeding angiectasia's in the stomach which were clipped.   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     Past Surgical History:  Procedure Laterality Date   ABDOMINAL HYSTERECTOMY     CARPAL TUNNEL RELEASE  05/13/2011   Procedure: CARPAL TUNNEL RELEASE;  Surgeon: Nita Sells, MD;  Location: [name];  Service: Orthopedics;  Laterality: Left;   COLONOSCOPY N/A 03/02/2020   Procedure: COLONOSCOPY;  Surgeon: Ronald Lobo, MD;  Location: [facility] ENDOSCOPY;  Service: Endoscopy;  Laterality: N/A;   COLONOSCOPY WITH PROPOFOL N/A 04/28/2014   Procedure: COLONOSCOPY WITH PROPOFOL;  Surgeon: Cleotis Nipper, MD;  Location: [facility] ENDOSCOPY;  Service: Endoscopy;  Laterality: N/A;   DG TOES*L*   2/10   rt   DILATION AND CURETTAGE OF UTERUS     ENTEROSCOPY N/A 10/17/2017   Procedure: ENTEROSCOPY;  Surgeon: Otis Brace, MD;  Location: [place];  Service: Gastroenterology;  Laterality: N/A;   ESOPHAGOGASTRODUODENOSCOPY N/A 04/10/2014   Procedure: ESOPHAGOGASTRODUODENOSCOPY (EGD);  Surgeon: [name], MD;  Location: [facility] ENDOSCOPY;  Service: Endoscopy;  Laterality: N/A;   ESOPHAGOGASTRODUODENOSCOPY N/A 05/10/2017   Procedure: ESOPHAGOGASTRODUODENOSCOPY (EGD);  Surgeon: Ronald Lobo, MD;  Location: [facility] ENDOSCOPY;  Service: Endoscopy;  Laterality: N/A;   ESOPHAGOGASTRODUODENOSCOPY N/A 09/22/2017   Procedure: ESOPHAGOGASTRODUODENOSCOPY (EGD);  Surgeon: Clarene Essex, MD;  Location: [place];  Service: Endoscopy;  Laterality: N/A;  bedside   ESOPHAGOGASTRODUODENOSCOPY N/A 03/02/2020   Procedure: ESOPHAGOGASTRODUODENOSCOPY (EGD);  Surgeon: Ronald Lobo, MD;  Location: [facility];  Service: Endoscopy;  Laterality: N/A;   ESOPHAGOGASTRODUODENOSCOPY N/A 11/03/2020   Procedure: ESOPHAGOGASTRODUODENOSCOPY (EGD);  Surgeon: Wilford Corner, MD;  Location: [place];  Service: Endoscopy;  Laterality: N/A;   ESOPHAGOGASTRODUODENOSCOPY N/A 04/12/2022   Procedure: ESOPHAGOGASTRODUODENOSCOPY (EGD);  Surgeon: Clarene Essex, MD;  Location: [facility] ENDOSCOPY;  Service: Gastroenterology;  Laterality: N/A;   ESOPHAGOGASTRODUODENOSCOPY N/A 05/27/2022   Procedure: ESOPHAGOGASTRODUODENOSCOPY (EGD);  Surgeon: Arta Silence, MD;  Location: [facility] ENDOSCOPY;  Service: Gastroenterology;  Laterality: N/A;   ESOPHAGOGASTRODUODENOSCOPY (EGD) WITH PROPOFOL N/A 04/27/2014   Procedure: ESOPHAGOGASTRODUODENOSCOPY (EGD) WITH PROPOFOL;  Surgeon: Cleotis Nipper, MD;  Location: [place];  Service: Endoscopy;  Laterality: N/A;  possible apc   ESOPHAGOGASTRODUODENOSCOPY (EGD) WITH PROPOFOL N/A 09/30/2017   Procedure: ESOPHAGOGASTRODUODENOSCOPY (  EGD) WITH PROPOFOL;  Surgeon: [name], MD;  Location: [place];  Service: Gastroenterology;  Laterality: N/A;   ESOPHAGOGASTRODUODENOSCOPY (EGD) WITH PROPOFOL N/A 10/01/2017   Procedure: ESOPHAGOGASTRODUODENOSCOPY (EGD) WITH PROPOFOL;  Surgeon: [name], MD;  Location: [place];  Service: Gastroenterology;  Laterality: N/A;   ESOPHAGOGASTRODUODENOSCOPY (EGD) WITH PROPOFOL N/A 10/08/2017   Procedure: ESOPHAGOGASTRODUODENOSCOPY (EGD) WITH PROPOFOL;  Surgeon: Otis Brace, MD;  Location: [place];  Service: Gastroenterology;  Laterality: N/A;   ESOPHAGOGASTRODUODENOSCOPY (EGD) WITH PROPOFOL N/A 10/17/2017   Procedure: ESOPHAGOGASTRODUODENOSCOPY (EGD) WITH PROPOFOL;  Surgeon: Otis Brace, MD;  Location: [place];  Service: Gastroenterology;  Laterality: N/A;   ESOPHAGOGASTRODUODENOSCOPY (EGD) WITH PROPOFOL N/A 10/19/2017   Procedure: ESOPHAGOGASTRODUODENOSCOPY (EGD) WITH PROPOFOL;  Surgeon: Otis Brace, MD;  Location: [place];  Service: Gastroenterology;  Laterality: N/A;   ESOPHAGOGASTRODUODENOSCOPY (EGD) WITH PROPOFOL N/A 12/04/2018   Procedure: ESOPHAGOGASTRODUODENOSCOPY (EGD) WITH PROPOFOL;  Surgeon: Wilford Corner, MD;  Location: [facility] ENDOSCOPY;  Service: Endoscopy;  Laterality: N/A;   GIVENS CAPSULE STUDY N/A 10/02/2017   Procedure: GIVENS CAPSULE STUDY;  Surgeon: [name], MD;  Location: [place];  Service: Gastroenterology;  Laterality: N/A;   GIVENS CAPSULE STUDY N/A 10/08/2017   Procedure: GIVENS CAPSULE STUDY;  Surgeon: Otis Brace, MD;  Location: [place];  Service: Gastroenterology;  Laterality: N/A;  endoscopic placement of capsule   GIVENS CAPSULE STUDY N/A 03/02/2020   Procedure: GIVENS CAPSULE STUDY;  Surgeon: Ronald Lobo, MD;  Location: [facility] ENDOSCOPY;  Service: Endoscopy;  Laterality: N/A;   HEMOSTASIS CLIP PLACEMENT  12/04/2018   Procedure: HEMOSTASIS CLIP PLACEMENT;  Surgeon: Wilford Corner, MD;  Location: [facility] ENDOSCOPY;  Service: Endoscopy;;   HEMOSTASIS CLIP PLACEMENT  05/27/2022    Procedure: HEMOSTASIS CLIP PLACEMENT;  Surgeon: Arta Silence, MD;  Location: [facility] ENDOSCOPY;  Service: Gastroenterology;;   HEMOSTASIS CONTROL  05/27/2022   Procedure: HEMOSTASIS CONTROL;  Surgeon: Arta Silence, MD;  Location: [facility]OS[facility];  Service: Gastroenterology;;   HOT HEMOSTASIS N/A 04/27/2014   Procedure: HOT HEMOSTASIS (ARGON PLASMA COAGULATION/BICAP);  Surgeon: Cleotis Nipper, MD;  Location: [facility] ENDOSCOPY;  Service: Endoscopy;  Laterality: N/A;   HOT HEMOSTASIS N/A 09/30/2017   Procedure: HOT HEMOSTASIS (ARGON PLASMA COAGULATION/BICAP);  Surgeon: [name], MD;  Location: [place];  Service: Gastroenterology;  Laterality: N/A;   HOT HEMOSTASIS N/A 10/01/2017   Procedure: HOT HEMOSTASIS (ARGON PLASMA COAGULATION/BICAP);  Surgeon: [name], MD;  Location: [place];  Service: Gastroenterology;  Laterality: N/A;   HOT HEMOSTASIS N/A 10/17/2017   Procedure: HOT HEMOSTASIS (ARGON PLASMA COAGULATION/BICAP);  Surgeon: Otis Brace, MD;  Location: [facility] ENDOSCOPY;  Service: Gastroenterology;  Laterality: N/A;   HOT HEMOSTASIS N/A 10/19/2017   Procedure: HOT HEMOSTASIS (ARGON PLASMA COAGULATION/BICAP);  Surgeon: Otis Brace, MD;  Location: [facility] ENDOSCOPY;  Service: Gastroenterology;  Laterality: N/A;   HOT HEMOSTASIS N/A 03/02/2020   Procedure: HOT HEMOSTASIS (ARGON PLASMA COAGULATION/BICAP);  Surgeon: Ronald Lobo, MD;  Location: [facility];  Service: Endoscopy;  Laterality: N/A;   HOT HEMOSTASIS N/A 04/12/2022   Procedure: HOT HEMOSTASIS (ARGON PLASMA COAGULATION/BICAP);  Surgeon: Clarene Essex, MD;  Location: [facility];  Service: Gastroenterology;  Laterality: N/A;   IR IMAGING GUIDED PORT INSERTION  07/08/2018   L shoulder Surgery  2011   POLYPECTOMY  03/02/2020   Procedure: POLYPECTOMY;  Surgeon: Ronald Lobo, MD;  Location: [facility] ENDOSCOPY;  Service: Endoscopy;;   Clide Deutscher  11/03/2020   Procedure: Clide Deutscher;  Surgeon: Wilford Corner, MD;  Location: [place];  Service: Endoscopy;;   SUBMUCOSAL INJECTION  09/22/2017   Procedure: SUBMUCOSAL INJECTION;  Surgeon: Clarene Essex, MD;  Location: [facility] ENDOSCOPY;  Service: Endoscopy;;   SUBMUCOSAL INJECTION  12/04/2018   Procedure: SUBMUCOSAL INJECTION;  Surgeon: Wilford Corner, MD;  Location: [facility] ENDOSCOPY;  Service: Endoscopy;;     Outpatient Encounter Medications as of 06/26/2022  Medication Sig   Accu-Chek Softclix Lancets lancets Use to check blood sugar before breakfast and before dinner while on steroids (Patient taking differently: 1 each by Other route See admin instructions. Use to check blood sugar before breakfast and before dinner while on steroids)   ALPRAZolam (XANAX) 0.5 MG tablet TAKE 1 TABLET BY MOUTH AT BEDTIME AS NEEDED FOR ANXIETY OR SLEEP   Aminocaproic Acid 1000 MG TABS TAKE 1 TABLET(1000 MG) BY MOUTH IN THE MORNING AND AT BEDTIME (Patient taking differently: Take 1,000 mg by mouth in the morning and at bedtime.)   amLODipine (NORVASC) 10 MG tablet TAKE 1 TABLET(10 MG) BY MOUTH DAILY   citalopram (CELEXA) 20 MG tablet Take 1 tablet (20 mg total) by mouth daily.   glucose blood (ACCU-CHEK GUIDE) test strip Check blood sugar 2 times per day while on steroids before breakfast and dinner (Patient taking differently: 1 each by Other route See admin instructions. Check blood sugar 2 times per day while on steroids before breakfast and dinner)   lidocaine-prilocaine (EMLA) cream Apply 1 application topically as needed. (Patient taking differently: Apply 1 application  topically as needed (for port access).)   lisinopril (ZESTRIL) 20 MG tablet Take 1 tablet (20 mg total) by mouth daily.   metFORMIN (GLUCOPHAGE) 500 MG tablet TAKE 1 TABLET(500 MG) BY MOUTH TWICE DAILY WITH A MEAL (Patient taking differently: Take 500 mg by mouth 2 (two) times daily with a meal.)   nicotine (NICODERM CQ - DOSED IN MG/24 HOURS) 14 mg/24hr patch Place 1 patch (14 mg total) onto the skin daily.   ondansetron  (ZOFRAN) 4 MG tablet Take 1 tablet (4 mg total) by mouth every 8 (eight) hours as needed for nausea or vomiting.   pantoprazole (PROTONIX) 40 MG tablet Take 1 tablet (40 mg total) by mouth 2 (two) times daily.   Podiatric Products (FLEXITOL HEEL BALM) OINT Apply 1 application  topically as needed (to the feet- for dry skin).   PROAIR HFA 108 (90 Base) MCG/ACT inhaler INHALE 1 TO 2 PUFFS INTO THE LUNGS EVERY 6 HOURS AS NEEDED FOR WHEEZING OR SHORTNESS OF BREATH (Patient taking differently: Inhale 1-2 puffs into the lungs every 6 (six) hours as needed for wheezing or shortness of breath.)   terbinafine (LAMISIL AT) 1 % cream Apply 1 application topically 2 (two) times daily. Both bottom and top of both feet and toes (Patient taking differently: Apply 1 application  topically 2 (two) times daily as needed (for affected areas of both feet and toes- bottoms and tops).)   traZODone (DESYREL) 100 MG tablet TAKE 1 TABLET(100 MG) BY MOUTH AT BEDTIME   TYLENOL PM EXTRA STRENGTH 500-25 MG TABS tablet Take 2 tablets by mouth at bedtime as needed (for sleep).   Facility-Administered Encounter Medications as of 06/26/2022  Medication   diphenhydrAMINE (BENADRYL) 25 mg capsule   diphenhydrAMINE (BENADRYL) 25 mg capsule   phenylephrine (NEO-SYNEPHRINE) 1 % nasal drops 2 drop     There were no vitals filed for this visit. There is no height or weight on file to calculate BMI.   PHYSICAL EXAM GENERAL:alert, no distress and comfortable SKIN: no rash  EYES: sclera clear NECK: without mass  LYMPH:  no palpable cervical or supraclavicular lymphadenopathy  LUNGS: clear with normal breathing effort HEART: regular rate & rhythm, no lower extremity edema ABDOMEN: abdomen soft, non-tender and normal bowel sounds NEURO: alert & oriented x 3 with fluent speech, no focal motor/sensory deficits Breast exam:  PAC without erythema    CBC    Component Value Date/Time   WBC 9.0 05/27/2022 0500   RBC 2.62 (L)  05/27/2022 0500   HGB 8.9 (L) 05/29/2022 0620   HGB 6.5 (LL) 03/29/2022 1302   HGB 8.6 (L) 06/14/2020 1626   HGB 8.1 (L) 02/05/2017 1407   HCT 28.8 (L) 05/29/2022 0620   HCT 28.2 (L) 06/14/2020 1626   HCT 26.6 (L) 02/05/2017 1407   PLT 229 05/27/2022 0500   PLT 280 03/29/2022 1302   PLT 353 06/14/2020 1626   MCV 82.1 05/27/2022 0500   MCV 89 06/14/2020 1626   MCV 82.8 02/05/2017 1407   MCH 26.0 05/27/2022 0500   MCHC 31.6 05/27/2022 0500   RDW 17.8 (H) 05/27/2022 0500   RDW 18.9 (H) 06/14/2020 1626   RDW 22.6 (H) 02/05/2017 1407   LYMPHSABS 1.5 05/26/2022 0700   LYMPHSABS 2.5 05/20/2017 1551   LYMPHSABS 2.6 02/05/2017 1407   MONOABS 0.5 05/26/2022 0700   MONOABS 0.5 02/05/2017 1407   EOSABS 0.1 05/26/2022 0700   EOSABS 0.1 05/20/2017 1551   BASOSABS 0.0 05/26/2022 0700   BASOSABS 0.0 05/20/2017 1551   BASOSABS 0.1 02/05/2017 1407     CMP     Component Value Date/Time   NA 139 05/29/2022 0620   NA 147 (H) 04/02/2017 1708   NA 141 02/05/2017 1407   K 3.7 05/29/2022 0620   K 3.5 02/05/2017 1407   CL 105 05/29/2022 0620   CO2 24 05/29/2022 0620   CO2 24 02/05/2017 1407   GLUCOSE 105 (H) 05/29/2022 0620   GLUCOSE 134 02/05/2017 1407   BUN 9 05/29/2022 0620   BUN 5 (L) 04/02/2017 1708   BUN 9.3 02/05/2017 1407   CREATININE 0.84 05/29/2022 0620   CREATININE 0.77 09/13/2021 1140   CREATININE 0.9 02/05/2017 1407   CALCIUM 8.5 (L) 05/29/2022 0620   CALCIUM 8.9 02/05/2017 1407   PROT 6.2 (L) 05/26/2022 0700   PROT 6.7 04/02/2017 1708   PROT 7.1 02/05/2017 1407   ALBUMIN 2.9 (L) 05/26/2022 0700   ALBUMIN 3.8 04/02/2017 1708   ALBUMIN 3.5 02/05/2017 1407   AST 13 (L) 05/26/2022 0700   AST 12 (L) 09/13/2021 1140   AST 13 02/05/2017 1407   ALT 7 05/26/2022 0700   ALT 5 09/13/2021 1140   ALT 8 02/05/2017 1407   ALKPHOS 51 05/26/2022 0700   ALKPHOS 84 02/05/2017 1407   BILITOT 0.7 05/26/2022 0700   BILITOT 0.3 09/13/2021 1140   BILITOT 0.23 02/05/2017 1407    GFRNONAA >60 05/29/2022 0620   GFRNONAA >60 09/13/2021 1140   GFRAA >60 01/06/2020 0921     ASSESSMENT & PLAN:  PLAN:  No orders of the defined types were placed in this encounter.     All questions were answered. The patient knows to call the clinic with any problems, questions or concerns. No barriers to learning were detected. I spent *** counseling the patient face to face. The total time spent in the appointment was *** and more than 50% was on counseling, review of test results, and coordination of care.   Cira Rue, NP-C '@DATE'$ @

## 2022-06-26 ENCOUNTER — Inpatient Hospital Stay: Payer: Medicaid Other

## 2022-06-26 ENCOUNTER — Inpatient Hospital Stay: Payer: Medicaid Other | Attending: Hematology | Admitting: Nurse Practitioner

## 2022-06-27 ENCOUNTER — Ambulatory Visit: Payer: Medicaid Other

## 2022-07-01 DIAGNOSIS — M47816 Spondylosis without myelopathy or radiculopathy, lumbar region: Secondary | ICD-10-CM | POA: Diagnosis not present

## 2022-07-01 DIAGNOSIS — M170 Bilateral primary osteoarthritis of knee: Secondary | ICD-10-CM | POA: Diagnosis not present

## 2022-07-01 DIAGNOSIS — M1711 Unilateral primary osteoarthritis, right knee: Secondary | ICD-10-CM | POA: Diagnosis not present

## 2022-07-01 DIAGNOSIS — M1712 Unilateral primary osteoarthritis, left knee: Secondary | ICD-10-CM | POA: Diagnosis not present

## 2022-07-04 ENCOUNTER — Telehealth: Payer: Self-pay | Admitting: Hematology

## 2022-07-04 NOTE — Telephone Encounter (Signed)
Per 3/21 IB reached out to patient to schedule; left voicemail.

## 2022-07-08 ENCOUNTER — Telehealth: Payer: Self-pay | Admitting: Hematology

## 2022-07-08 NOTE — Telephone Encounter (Signed)
Per  3/22 IB reached out to patient to answer questions regarding appointments, left voicemail.

## 2022-07-18 ENCOUNTER — Telehealth: Payer: Self-pay | Admitting: Hematology

## 2022-07-18 NOTE — Telephone Encounter (Signed)
Reached out to patient to answer questions regarding upcoming appointments. Left voicemail.

## 2022-07-22 ENCOUNTER — Telehealth: Payer: Self-pay | Admitting: Hematology

## 2022-07-22 NOTE — Telephone Encounter (Signed)
Reached out to patient to verify time and date of appointments.

## 2022-07-23 ENCOUNTER — Inpatient Hospital Stay: Payer: Medicaid Other

## 2022-07-23 ENCOUNTER — Inpatient Hospital Stay (HOSPITAL_BASED_OUTPATIENT_CLINIC_OR_DEPARTMENT_OTHER): Payer: Medicaid Other | Admitting: Hematology

## 2022-07-23 ENCOUNTER — Other Ambulatory Visit: Payer: Self-pay

## 2022-07-23 ENCOUNTER — Other Ambulatory Visit (HOSPITAL_COMMUNITY): Payer: Self-pay

## 2022-07-23 ENCOUNTER — Inpatient Hospital Stay: Payer: Medicaid Other | Attending: Hematology

## 2022-07-23 ENCOUNTER — Encounter: Payer: Self-pay | Admitting: Hematology

## 2022-07-23 ENCOUNTER — Telehealth: Payer: Self-pay

## 2022-07-23 VITALS — BP 135/46 | HR 72 | Temp 98.4°F | Resp 18 | Ht 65.0 in | Wt 227.8 lb

## 2022-07-23 VITALS — BP 155/85 | HR 62 | Temp 98.7°F | Resp 18

## 2022-07-23 DIAGNOSIS — I780 Hereditary hemorrhagic telangiectasia: Secondary | ICD-10-CM

## 2022-07-23 DIAGNOSIS — F331 Major depressive disorder, recurrent, moderate: Secondary | ICD-10-CM | POA: Diagnosis not present

## 2022-07-23 DIAGNOSIS — Z95828 Presence of other vascular implants and grafts: Secondary | ICD-10-CM

## 2022-07-23 DIAGNOSIS — D649 Anemia, unspecified: Secondary | ICD-10-CM

## 2022-07-23 DIAGNOSIS — D500 Iron deficiency anemia secondary to blood loss (chronic): Secondary | ICD-10-CM | POA: Diagnosis not present

## 2022-07-23 DIAGNOSIS — R040 Epistaxis: Secondary | ICD-10-CM | POA: Diagnosis not present

## 2022-07-23 LAB — CBC WITH DIFFERENTIAL (CANCER CENTER ONLY)
Abs Immature Granulocytes: 0.08 10*3/uL — ABNORMAL HIGH (ref 0.00–0.07)
Basophils Absolute: 0.1 10*3/uL (ref 0.0–0.1)
Basophils Relative: 0 %
Eosinophils Absolute: 0.1 10*3/uL (ref 0.0–0.5)
Eosinophils Relative: 1 %
HCT: 20.4 % — ABNORMAL LOW (ref 36.0–46.0)
Hemoglobin: 5.8 g/dL — CL (ref 12.0–15.0)
Immature Granulocytes: 1 %
Lymphocytes Relative: 11 %
Lymphs Abs: 1.3 10*3/uL (ref 0.7–4.0)
MCH: 21.1 pg — ABNORMAL LOW (ref 26.0–34.0)
MCHC: 28.4 g/dL — ABNORMAL LOW (ref 30.0–36.0)
MCV: 74.2 fL — ABNORMAL LOW (ref 80.0–100.0)
Monocytes Absolute: 0.4 10*3/uL (ref 0.1–1.0)
Monocytes Relative: 4 %
Neutro Abs: 9.2 10*3/uL — ABNORMAL HIGH (ref 1.7–7.7)
Neutrophils Relative %: 83 %
Platelet Count: 445 10*3/uL — ABNORMAL HIGH (ref 150–400)
RBC: 2.75 MIL/uL — ABNORMAL LOW (ref 3.87–5.11)
RDW: 20.9 % — ABNORMAL HIGH (ref 11.5–15.5)
WBC Count: 11.1 10*3/uL — ABNORMAL HIGH (ref 4.0–10.5)
nRBC: 0.2 % (ref 0.0–0.2)

## 2022-07-23 LAB — SAMPLE TO BLOOD BANK

## 2022-07-23 LAB — CMP (CANCER CENTER ONLY)
ALT: <5 U/L (ref 0–44)
AST: 8 U/L — ABNORMAL LOW (ref 15–41)
Albumin: 3.7 g/dL (ref 3.5–5.0)
Alkaline Phosphatase: 79 U/L (ref 38–126)
Anion gap: 8 (ref 5–15)
BUN: 10 mg/dL (ref 8–23)
CO2: 25 mmol/L (ref 22–32)
Calcium: 9 mg/dL (ref 8.9–10.3)
Chloride: 110 mmol/L (ref 98–111)
Creatinine: 0.88 mg/dL (ref 0.44–1.00)
GFR, Estimated: >60 mL/min (ref >60)
Glucose, Bld: 115 mg/dL — ABNORMAL HIGH (ref 70–99)
Potassium: 3.1 mmol/L — ABNORMAL LOW (ref 3.5–5.1)
Sodium: 143 mmol/L (ref 135–145)
Total Bilirubin: 0.2 mg/dL — ABNORMAL LOW (ref 0.3–1.2)
Total Protein: 6.9 g/dL (ref 6.5–8.1)

## 2022-07-23 LAB — PREPARE RBC (CROSSMATCH)

## 2022-07-23 LAB — TOTAL PROTEIN, URINE DIPSTICK: Protein, ur: 30 mg/dL — AB

## 2022-07-23 LAB — TYPE AND SCREEN: ABO/RH(D): A NEG

## 2022-07-23 LAB — BPAM RBC: Blood Product Expiration Date: 202405032359

## 2022-07-23 LAB — FERRITIN: Ferritin: 3 ng/mL — ABNORMAL LOW (ref 11–307)

## 2022-07-23 MED ORDER — SODIUM CHLORIDE 0.9% FLUSH
10.0000 mL | INTRAVENOUS | Status: DC | PRN
  Administered 2022-07-23: 10 mL

## 2022-07-23 MED ORDER — SODIUM CHLORIDE 0.9 % IV SOLN: Freq: Once | INTRAVENOUS | Status: AC

## 2022-07-23 MED ORDER — AMINOCAPROIC ACID 1000 MG PO TABS
1.0000 | ORAL_TABLET | Freq: Two times a day (BID) | ORAL | 2 refills | Status: DC
  Filled 2022-07-23 (×2): qty 30, 15d supply, fill #0

## 2022-07-23 MED ORDER — SODIUM CHLORIDE 0.9% FLUSH
10.0000 mL | Freq: Once | INTRAVENOUS | Status: AC
  Administered 2022-07-23: 10 mL

## 2022-07-23 MED ORDER — SODIUM CHLORIDE 0.9 % IV SOLN
250.0000 mg | Freq: Once | INTRAVENOUS | Status: AC
  Administered 2022-07-23: 250 mg via INTRAVENOUS
  Filled 2022-07-23: qty 20

## 2022-07-23 MED ORDER — DIPHENHYDRAMINE HCL 25 MG PO CAPS
25.0000 mg | ORAL_CAPSULE | Freq: Once | ORAL | Status: AC
  Administered 2022-07-23: 25 mg via ORAL
  Filled 2022-07-23: qty 1

## 2022-07-23 MED ORDER — TRAZODONE HCL 100 MG PO TABS
ORAL_TABLET | ORAL | 2 refills | Status: DC
Start: 2022-07-23 — End: 2022-12-07

## 2022-07-23 MED ORDER — SODIUM CHLORIDE 0.9 % IV SOLN
5.0000 mg/kg | Freq: Once | INTRAVENOUS | Status: AC
  Administered 2022-07-23: 500 mg via INTRAVENOUS
  Filled 2022-07-23: qty 16

## 2022-07-23 MED ORDER — HEPARIN SOD (PORK) LOCK FLUSH 100 UNIT/ML IV SOLN
500.0000 [IU] | Freq: Once | INTRAVENOUS | Status: AC | PRN
  Administered 2022-07-23: 500 [IU]

## 2022-07-23 MED ORDER — ACETAMINOPHEN 325 MG PO TABS
650.0000 mg | ORAL_TABLET | Freq: Once | ORAL | Status: AC
  Administered 2022-07-23: 650 mg via ORAL
  Filled 2022-07-23: qty 2

## 2022-07-23 MED ORDER — ALPRAZOLAM 0.5 MG PO TABS: ORAL_TABLET | ORAL | 0 refills | Status: DC

## 2022-07-23 NOTE — Assessment & Plan Note (Signed)
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

## 2022-07-23 NOTE — Progress Notes (Signed)
[facility]   [phone] [phone]   Clinic Follow up Note   Patient Care Team: Olegario Messier, MD as PCP - General Malachy Mood, MD as Attending Physician (Hematology and Oncology)  Date of Service:  07/23/2022  CHIEF COMPLAINT: f/u of  HHT and anemia   CURRENT THERAPY:   -Blood transfusion for Hg<8.0 (2units on different days if Hg<7) -iv ferric gluconate, with ferritin goal 100-200.  -Avastin/Zirabev restarted on 04/03/18. Currently given PRN due to proteinuria. -Amicar 1g BID, intermittently since 07/17/18  ASSESSMENT:  Peggy House is a 62 y.o. 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 -she has required multiple blood transfusions and APCs since 2019. 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  -s/p IR embolization of gastric artery branch vessels on 12/10/17, cauterization of stomach for severe GI bleed in 11/2018, and epinephrine injection for bleeding ulcer on 11/03/20. -She has been non-complaint with her appointments, lost f/u since last visit in 03/2022, she was hospitalized twice in the past 3 to 4 months. -She is clinically stable, denies any significant GI bleeding or severe epistaxis lately, lab reviewed, hemoglobin 5.8 today, will give 2 units of blood transfusion, and restart IV iron and bevacizumab today. -I again encouraged her to continue follow-up lab and treatment weekly.  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     INTERVAL HISTORY: - Peggy House is here for a follow up of  HHT and anemia . She was last seen by me on 03/29/2022. She presents to the clinic alone.Pt state that she had not gotten  a call reminder for her appointments.Pt had some nose bleeding but reports that it was not a bad one.      All other systems were reviewed with the patient and are negative.  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;  Service: Orthopedics;  Laterality: Left;   COLONOSCOPY N/A 03/02/2020   Procedure: COLONOSCOPY;  Surgeon: Bernette Redbird, MD;  Location: [facility]COPY;  Service: Endoscopy;  Laterality: N/A;   COLONOSCOPY WITH PROPOFOL N/A 04/28/2014   Procedure: COLONOSCOPY WITH PROPOFOL;  Surgeon: Bernette Redbird  V, MD;  Location: [facility] ENDOSCOPY;  Service: Endoscopy;  Laterality: N/A;   DG TOES*L*  2/10   rt   DILATION AND CURETTAGE OF UTERUS     ENTEROSCOPY N/A 10/17/2017   Procedure: ENTEROSCOPY;  Surgeon: Kathi Der, MD;  Location: [facility] ENDOSCOPY;  Service: Gastroenterology;  Laterality: N/A;   ESOPHAGOGASTRODUODENOSCOPY N/A 04/10/2014   Procedure:  ESOPHAGOGASTRODUODENOSCOPY (EGD);  Surgeon: Shirley Friar, MD;  Location: [facility] ENDOSCOPY;  Service: Endoscopy;  Laterality: N/A;   ESOPHAGOGASTRODUODENOSCOPY N/A 05/10/2017   Procedure: ESOPHAGOGASTRODUODENOSCOPY (EGD);  Surgeon: Bernette Redbird, MD;  Location: [facility] ENDOSCOPY;  Service: Endoscopy;  Laterality: N/A;   ESOPHAGOGASTRODUODENOSCOPY N/A 09/22/2017   Procedure: ESOPHAGOGASTRODUODENOSCOPY (EGD);  Surgeon: Vida Rigger, MD;  Location: [facility] ENDOSCOPY;  Service: Endoscopy;  Laterality: N/A;  bedside   ESOPHAGOGASTRODUODENOSCOPY N/A 03/02/2020   Procedure: ESOPHAGOGASTRODUODENOSCOPY (EGD);  Surgeon: Bernette Redbird, MD;  Location: [facility] ENDOSCOPY;  Service: Endoscopy;  Laterality: N/A;   ESOPHAGOGASTRODUODENOSCOPY N/A 11/03/2020   Procedure: ESOPHAGOGASTRODUODENOSCOPY (EGD);  Surgeon: Charlott Rakes, MD;  Location: [facility] ENDOSCOPY;  Service: Endoscopy;  Laterality: N/A;   ESOPHAGOGASTRODUODENOSCOPY N/A 04/12/2022   Procedure: ESOPHAGOGASTRODUODENOSCOPY (EGD);  Surgeon: Vida Rigger, MD;  Location: [facility] ENDOSCOPY;  Service: Gastroenterology;  Laterality: N/A;   ESOPHAGOGASTRODUODENOSCOPY N/A 05/27/2022   Procedure: ESOPHAGOGASTRODUODENOSCOPY (EGD);  Surgeon: Willis Modena, MD;  Location: [facility] ENDOSCOPY;  Service: Gastroenterology;  Laterality: N/A;   ESOPHAGOGASTRODUODENOSCOPY (EGD) WITH PROPOFOL N/A 04/27/2014   Procedure: ESOPHAGOGASTRODUODENOSCOPY (EGD) WITH PROPOFOL;  Surgeon: Florencia Reasons, MD;  Location: [facility] ENDOSCOPY;  Service: Endoscopy;  Laterality: N/A;  possible apc   ESOPHAGOGASTRODUODENOSCOPY (EGD) WITH PROPOFOL N/A 09/30/2017   Procedure: ESOPHAGOGASTRODUODENOSCOPY (EGD) WITH PROPOFOL;  Surgeon: Kerin Salen, MD;  Location: [facility] ENDOSCOPY;  Service: Gastroenterology;  Laterality: N/A;   ESOPHAGOGASTRODUODENOSCOPY (EGD) WITH PROPOFOL N/A 10/01/2017   Procedure: ESOPHAGOGASTRODUODENOSCOPY (EGD) WITH PROPOFOL;  Surgeon: Kerin Salen, MD;  Location: [facility] ENDOSCOPY;  Service: Gastroenterology;   Laterality: N/A;   ESOPHAGOGASTRODUODENOSCOPY (EGD) WITH PROPOFOL N/A 10/08/2017   Procedure: ESOPHAGOGASTRODUODENOSCOPY (EGD) WITH PROPOFOL;  Surgeon: Kathi Der, MD;  Location: [facility] ENDOSCOPY;  Service: Gastroenterology;  Laterality: N/A;   ESOPHAGOGASTRODUODENOSCOPY (EGD) WITH PROPOFOL N/A 10/17/2017   Procedure: ESOPHAGOGASTRODUODENOSCOPY (EGD) WITH PROPOFOL;  Surgeon: Kathi Der, MD;  Location: [facility] ENDOS[facility];  Service: Gastroenterology;  Laterality: N/A;   ESOPHAGOGASTRODUODENOSCOPY (EGD) WITH PROPOFOL N/A 10/19/2017   Procedure: ESOPHAGOGASTRODUODENOSCOPY (EGD) WITH PROPOFOL;  Surgeon: Kathi Der, MD;  Location: [facility] ENDOSCOPY;  Service: Gastroenterology;  Laterality: N/A;   ESOPHAGOGASTRODUODENOSCOPY (EGD) WITH PROPOFOL N/A 12/04/2018   Procedure: ESOPHAGOGASTRODUODENOSCOPY (EGD) WITH PROPOFOL;  Surgeon: Charlott Rakes, MD;  Location: [facility] END[facility];  Service: Endoscopy;  Laterality: N/A;   GIVENS CAPSULE STUDY N/A 10/02/2017   Procedure: GIVENS CAPSULE STUDY;  Surgeon: Kerin Salen, MD;  Location: [facility] ENDOSCOPY;  Service: Gastroenterology;  Laterality: N/A;   GIVENS CAPSULE STUDY N/A 10/08/2017   Procedure: GIVENS CAPSULE STUDY;  Surgeon: Kathi Der, MD;  Location: [facility] ENDOSCOPY;  Service: Gastroenterology;  Laterality: N/A;  endoscopic placement of capsule   GIVENS CAPSULE STUDY N/A 03/02/2020   Procedure: GIVENS CAPSULE STUDY;  Surgeon: Bernette Redbird, MD;  Location: [facility] ENDOSCOPY;  Service: Endoscopy;  Laterality: N/A;   HEMOSTASIS CLIP PLACEMENT  12/04/2018   Procedure: HEMOSTASIS CLIP PLACEMENT;  Surgeon: Charlott Rakes, MD;  Location: [facility] END[facility];  Service: Endoscopy;;   HEMOSTASIS CLIP PLACEMENT  05/27/2022   Procedure: HEMOSTASIS CLIP PLACEMENT;  Surgeon: Willis Modena, MD;  Location: [facility] ENDOSCOPY;  Service: Gastroenterology;;   HEMOSTASIS CONTROL  05/27/2022   Procedure: HEMOSTASIS CONTROL;  Surgeon:  Willis Modena, MD;  Location: [facility] ENDOSCOPY;  Service:  Gastroenterology;;   HOT HEMOSTASIS N/A 04/27/2014   Procedure: HOT HEMOSTASIS (ARGON PLASMA COAGULATION/BICAP);  Surgeon: Florencia Reasons, MD;  Location: [facility] ENDOSCOPY;  Service: Endoscopy;  Laterality: N/A;   HOT HEMOSTASIS N/A 09/30/2017   Procedure: HOT HEMOSTASIS (ARGON PLASMA COAGULATION/BICAP);  Surgeon: Kerin Salen, MD;  Location: [facility] ENDOSCOPY;  Service: Gastroenterology;  Laterality: N/A;   HOT HEMOSTASIS N/A 10/01/2017   Procedure: HOT HEMOSTASIS (ARGON PLASMA COAGULATION/BICAP);  Surgeon: Kerin Salen, MD;  Location: [facility] ENDOSCOPY;  Service: Gastroenterology;  Laterality: N/A;   HOT HEMOSTASIS N/A 10/17/2017   Procedure: HOT HEMOSTASIS (ARGON PLASMA COAGULATION/BICAP);  Surgeon: Kathi Der, MD;  Location: [facility] ENDOSCOPY;  Service: Gastroenterology;  Laterality: N/A;   HOT HEMOSTASIS N/A 10/19/2017   Procedure: HOT HEMOSTASIS (ARGON PLASMA COAGULATION/BICAP);  Surgeon: Kathi Der, MD;  Location: [facility] ENDOSCOPY;  Service: Gastroenterology;  Laterality: N/A;   HOT HEMOSTASIS N/A 03/02/2020   Procedure: HOT HEMOSTASIS (ARGON PLASMA COAGULATION/BICAP);  Surgeon: Bernette Redbird, MD;  Location: [facility] ENDOSCOPY;  Service: Endoscopy;  Laterality: N/A;   HOT HEMOSTASIS N/A 04/12/2022   Procedure: HOT HEMOSTASIS (ARGON PLASMA COAGULATION/BICAP);  Surgeon: Vida Rigger, MD;  Location: [facility] ENDOSCOPY;  Service: Gastroenterology;  Laterality: N/A;   IR IMAGING GUIDED PORT INSERTION  07/08/2018   L shoulder Surgery  2011   POLYPECTOMY  03/02/2020   Procedure: POLYPECTOMY;  Surgeon: Bernette Redbird, MD;  Location: [facility] ENDOSCOPY;  Service: Endoscopy;;   SCLEROTHERAPY  11/03/2020   Procedure: Susa Day;  Surgeon: Charlott Rakes, MD;  Location: [facility] ENDOSCOPY;  Service: Endoscopy;;   SUBMUCOSAL INJECTION  09/22/2017   Procedure: SUBMUCOSAL INJECTION;  Surgeon: Vida Rigger, MD;  Location: [facility] ENDOSCOPY;  Service: Endoscopy;;   SUBMUCOSAL INJECTION  12/04/2018   Procedure: SUBMUCOSAL INJECTION;  Surgeon:  Charlott Rakes, MD;  Location: [facility] ENDOSCOPY;  Service: Endoscopy;;    I have reviewed the social history and family history with the patient and they are unchanged from previous note.  ALLERGIES:  is allergic to feraheme [ferumoxytol], nsaids, tomato, iron (ferrous sulfate) [ferrous sulfate er], and wasp venom.  MEDICATIONS:  Current Outpatient Medications  Medication Sig Dispense Refill   Accu-Chek Softclix Lancets lancets Use to check blood sugar before breakfast and before dinner while on steroids (Patient taking differently: 1 each by Other route See admin instructions. Use to check blood sugar before breakfast and before dinner while on steroids) 100 each 1   ALPRAZolam (XANAX) 0.5 MG tablet TAKE 1 TABLET BY MOUTH AT BEDTIME AS NEEDED FOR ANXIETY OR SLEEP 30 tablet 0   Aminocaproic Acid 1000 MG TABS Take 1 tablet (1,000 mg total) by mouth in the morning and at bedtime. 60 tablet 2   amLODipine (NORVASC) 10 MG tablet TAKE 1 TABLET(10 MG) BY MOUTH DAILY 90 tablet 3   citalopram (CELEXA) 20 MG tablet Take 1 tablet (20 mg total) by mouth daily. 90 tablet 3   glucose blood (ACCU-CHEK GUIDE) test strip Check blood sugar 2 times per day while on steroids before breakfast and dinner (Patient taking differently: 1 each by Other route See admin instructions. Check blood sugar 2 times per day while on steroids before breakfast and dinner) 50 each 3   lidocaine-prilocaine (EMLA) cream Apply 1 application topically as needed. (Patient taking differently: Apply 1 application  topically as needed (for port access).) 30 g 0   lisinopril (ZESTRIL) 20 MG tablet Take 1 tablet (20 mg total) by mouth daily. 90 tablet 3   metFORMIN (  GLUCOPHAGE) 500 MG tablet TAKE 1 TABLET(500 MG) BY MOUTH TWICE DAILY WITH A MEAL (Patient taking differently: Take 500 mg by mouth 2 (two) times daily with a meal.) 180 tablet 3   nicotine (NICODERM CQ - DOSED IN MG/24 HOURS) 14 mg/24hr patch Place 1 patch (14 mg total) onto the  skin daily. 30 patch 3   ondansetron (ZOFRAN) 4 MG tablet Take 1 tablet (4 mg total) by mouth every 8 (eight) hours as needed for nausea or vomiting. 20 tablet 0   pantoprazole (PROTONIX) 40 MG tablet Take 1 tablet (40 mg total) by mouth 2 (two) times daily. 60 tablet 1   Podiatric Products (FLEXITOL HEEL BALM) OINT Apply 1 application  topically as needed (to the feet- for dry skin).     PROAIR HFA 108 (90 Base) MCG/ACT inhaler INHALE 1 TO 2 PUFFS INTO THE LUNGS EVERY 6 HOURS AS NEEDED FOR WHEEZING OR SHORTNESS OF BREATH (Patient taking differently: Inhale 1-2 puffs into the lungs every 6 (six) hours as needed for wheezing or shortness of breath.) 8.5 g 0   terbinafine (LAMISIL AT) 1 % cream Apply 1 application topically 2 (two) times daily. Both bottom and top of both feet and toes (Patient taking differently: Apply 1 application  topically 2 (two) times daily as needed (for affected areas of both feet and toes- bottoms and tops).) 36 g 0   traZODone (DESYREL) 100 MG tablet TAKE 1 TABLET(100 MG) BY MOUTH AT BEDTIME 30 tablet 2   TYLENOL PM EXTRA STRENGTH 500-25 MG TABS tablet Take 2 tablets by mouth at bedtime as needed (for sleep).     No current facility-administered medications for this visit.   Facility-Administered Medications Ordered in Other Visits  Medication Dose Route Frequency Provider Last Rate Last Admin   0.9 %  sodium chloride infusion   Intravenous Once Malachy Mood, MD       bevacizumab-adcd [facility]) 500 mg in sodium chloride 0.9 % 100 mL chemo infusion  5 mg/kg (Treatment Plan Recorded) Intravenous Once Malachy Mood, MD       diphenhydrAMINE (BENADRYL) 25 mg capsule            diphenhydrAMINE (BENADRYL) 25 mg capsule            heparin lock flush 100 unit/mL  500 Units Intracatheter Once PRN Malachy Mood, MD       phenylephrine (NEO-SYNEPHRINE) 1 % nasal drops 2 drop  2 drop Left Nare Once Malachy Mood, MD       sodium chloride flush (NS) 0.9 % injection 10 mL  10 mL Intracatheter PRN  Malachy Mood, MD        PHYSICAL EXAMINATION: ECOG PERFORMANCE STATUS: 2 - Symptomatic, <50% confined to bed  Vitals:   07/23/22 1039  BP: (!) 135/46  Pulse: 72  Resp: 18  Temp: 98.4 F (36.9 C)  SpO2: 100%   Wt Readings from Last 3 Encounters:  07/23/22 227 lb 12.8 oz (103.3 kg)  06/20/22 226 lb 9.6 oz (102.8 kg)  05/25/22 226 lb 3 oz (102.6 kg)     GENERAL:alert, no distress and comfortable SKIN: skin color normal, no rashes or significant lesions EYES: normal, Conjunctiva are pink and non-injected, sclera clear  NEURO: alert & oriented x 3 with fluent speech   LABORATORY DATA:  I have reviewed the data as listed    Latest Ref Rng & Units 07/23/2022   10:06 AM 05/29/2022    6:20 AM 05/28/2022    5:00 PM  CBC  WBC 4.0 - 10.5 K/uL 11.1     Hemoglobin 12.0 - 15.0 g/dL 5.8  8.9  9.1   Hematocrit 36.0 - 46.0 % 20.4  28.8  29.1   Platelets 150 - 400 K/uL 445           Latest Ref Rng & Units 07/23/2022   10:06 AM 05/29/2022    6:20 AM 05/27/2022    5:00 AM  CMP  Glucose 70 - 99 mg/dL 161115  096105  96   BUN 8 - 23 mg/dL 10  9  10    Creatinine 0.44 - 1.00 mg/dL 0.450.88  4.090.84  8.110.88   Sodium 135 - 145 mmol/L 143  139  141   Potassium 3.5 - 5.1 mmol/L 3.1  3.7  3.5   Chloride 98 - 111 mmol/L 110  105  109   CO2 22 - 32 mmol/L 25  24  23    Calcium 8.9 - 10.3 mg/dL 9.0  8.5  8.2   Total Protein 6.5 - 8.1 g/dL 6.9     Total Bilirubin 0.3 - 1.2 mg/dL 0.2     Alkaline Phos 38 - 126 U/L 79     AST 15 - 41 U/L 8     ALT 0 - 44 U/L <5         RADIOGRAPHIC STUDIES: I have personally reviewed the radiological images as listed and agreed with the findings in the report. No results found.    No orders of the defined types were placed in this encounter.  All questions were answered. The patient knows to call the clinic with any problems, questions or concerns. No barriers to learning was detected. The total time spent in the appointment was 30 minutes.     Malachy MoodYan Xaden Kaufman, MD 07/23/2022    Carolin CoyI, LaChelle McNairy, CMA, am acting as scribe for Malachy MoodYan Chett Taniguchi, MD.   I have reviewed the above documentation for accuracy and completeness, and I agree with the above.

## 2022-07-23 NOTE — Patient Instructions (Addendum)
[facility]  Discharge Instructions: Thank you for choosing [facility] to provide your oncology and hematology care.   If you have a lab appointment with the Cancer Center, please go directly to the Cancer Center and check in at the registration area.   Wear comfortable clothing and clothing appropriate for easy access to any Portacath or PICC line.   We strive to give you quality time with your provider. You may need to reschedule your appointment if you arrive late (15 or more minutes).  Arriving late affects you and other patients whose appointments are after yours.  Also, if you miss three or more appointments without notifying the office, you may be dismissed from the clinic at the provider's discretion.      For prescription refill requests, have your pharmacy contact our office and allow 72 hours for refills to be completed.    Today you received the following chemotherapy and/or immunotherapy agents : Bevacizumab      To help prevent nausea and vomiting after your treatment, we encourage you to take your nausea medication as directed.  BELOW ARE SYMPTOMS THAT SHOULD BE REPORTED IMMEDIATELY: *FEVER GREATER THAN 100.4 F (38 C) OR HIGHER *CHILLS OR SWEATING *NAUSEA AND VOMITING THAT IS NOT CONTROLLED WITH YOUR NAUSEA MEDICATION *UNUSUAL SHORTNESS OF BREATH *UNUSUAL BRUISING OR BLEEDING *URINARY PROBLEMS (pain or burning when urinating, or frequent urination) *BOWEL PROBLEMS (unusual diarrhea, constipation, pain near the anus) TENDERNESS IN MOUTH AND THROAT WITH OR WITHOUT PRESENCE OF ULCERS (sore throat, sores in mouth, or a toothache) UNUSUAL RASH, SWELLING OR PAIN  UNUSUAL VAGINAL DISCHARGE OR ITCHING   Items with * indicate a potential emergency and should be followed up as soon as possible or go to the Emergency Department if any problems should occur.  Please show the CHEMOTHERAPY ALERT CARD or IMMUNOTHERAPY ALERT CARD at  check-in to the Emergency Department and triage nurse.  Should you have questions after your visit or need to cancel or reschedule your appointment, please contact [facility]  Dept: [phone]  and follow the prompts.  Office hours are 8:00 a.m. to 4:30 p.m. Monday - Friday. Please note that voicemails left after 4:00 p.m. may not be returned until the following business day.  We are closed weekends and major holidays. You have access to a nurse at all times for urgent questions. Please call the main number to the clinic Dept: [phone] and follow the prompts.   For any non-urgent questions, you may also contact your provider using MyChart. We now offer e-Visits for anyone 3218 and older to request care online for non-urgent symptoms. For details visit [email or web address].   Also download the MyChart app! Go to the app store, search "MyChart", open the app, select [place], and log in with your MyChart username and password.  Sodium Ferric Gluconate Complex Injection What is this medication? SODIUM FERRIC GLUCONATE COMPLEX (SOE dee um FER ik GLOO koe nate KOM pleks) treats low levels of iron (iron deficiency anemia) in people with kidney disease. Iron is a mineral that plays an important role in making red blood cells, which carry oxygen from your lungs to the rest of your body. This medicine may be used for other purposes; ask your health care provider or pharmacist if you have questions. COMMON BRAND NAME(S): Ferrlecit, Nulecit What should I tell my care team before I take this medication? They need to know if you have  any of the following conditions: Anemia that is not from iron deficiency High levels of iron in the blood An unusual or allergic reaction to iron, other medications, foods, dyes, or preservatives Pregnant or are trying to become pregnant Breast-feeding How should I use this medication? This medication is injected into a vein. It  is given by your care team in a hospital or clinic setting. Talk to your care team about the use of this medication in children. While it may be prescribed for children as young as 6 years for selected conditions, precautions do apply. Overdosage: If you think you have taken too much of this medicine contact a poison control center or emergency room at once. NOTE: This medicine is only for you. Do not share this medicine with others. What if I miss a dose? It is important not to miss your dose. Call your care team if you are unable to keep an appointment. What may interact with this medication? Do not take this medication with any of the following: Deferasirox Deferoxamine Dimercaprol This medication may also interact with the following: Other iron products This list may not describe all possible interactions. Give your health care provider a list of all the medicines, herbs, non-prescription drugs, or dietary supplements you use. Also tell them if you smoke, drink alcohol, or use illegal drugs. Some items may interact with your medicine. What should I watch for while using this medication? Your condition will be monitored carefully while you are receiving this medication. Visit your care team for regular checks on your progress. You may need blood work while you are taking this medication. What side effects may I notice from receiving this medication? Side effects that you should report to your care team as soon as possible: Allergic reactions--skin rash, itching, hives, swelling of the face, lips, tongue, or throat Low blood pressure--dizziness, feeling faint or lightheaded, blurry vision Shortness of breath Side effects that usually do not require medical attention (report to your care team if they continue or are bothersome): Flushing Headache Joint pain Muscle pain Nausea Pain, redness, or irritation at injection site This list may not describe all possible side effects. Call your  doctor for medical advice about side effects. You may report side effects to FDA at 1-800-FDA-1088. Where should I keep my medication? This medication is given in a hospital or clinic and will not be stored at home. NOTE: This sheet is a summary. It may not cover all possible information. If you have questions about this medicine, talk to your doctor, pharmacist, or health care provider.  2023 Elsevier/Gold Standard (2020-08-25 00:00:00) Blood Transfusion, Adult, Care After After a blood transfusion, it is common to have: Bruising and soreness at the IV site. A headache. Follow these instructions at home: Your doctor may give you more instructions. If you have problems, contact your doctor. Insertion site care     Follow instructions from your doctor about how to take care of your insertion site. This is where an IV tube was put into your vein. Make sure you: Wash your hands with soap and water for at least 20 seconds before and after you change your bandage. If you cannot use soap and water, use hand sanitizer. Change your bandage as told by your doctor. Check your insertion site every day for signs of infection. Check for: Redness, swelling, or pain. Bleeding from the site. Warmth. Pus or a bad smell. General instructions Take over-the-counter and prescription medicines only as told by your doctor. Rest  as told by your doctor. Go back to your normal activities as told by your doctor. Keep all follow-up visits. You may need to have tests at certain times to check your blood. Contact a doctor if: You have itching or red, swollen areas of skin (hives). You have a fever or chills. You have pain in the head, back, or chest. You feel worried or nervous (anxious). You feel weak after doing your normal activities. You have any of these problems at the insertion site: Redness, swelling, warmth, or pain. Bleeding that does not stop with pressure. Pus or a bad smell. If you received  your blood transfusion in an outpatient setting, you will be told whom to contact to report any reactions. Get help right away if: You have signs of a serious reaction. This may be coming from an allergy or the body's defense system (immune system). Signs include: Trouble breathing or shortness of breath. Swelling of the face or feeling warm (flushed). A widespread rash. Dark pee (urine) or blood in the pee. Fast heartbeat. These symptoms may be an emergency. Get help right away. Call 911. Do not wait to see if the symptoms will go away. Do not drive yourself to the hospital. Summary Bruising and soreness at the IV site are common. Check your insertion site every day for signs of infection. Rest as told by your doctor. Go back to your normal activities as told by your doctor. Get help right away if you have signs of a serious reaction. This information is not intended to replace advice given to you by your health care provider. Make sure you discuss any questions you have with your health care provider. Document Revised: 06/29/2021 Document Reviewed: 06/29/2021 Elsevier Patient Education  2023 [employer].

## 2022-07-23 NOTE — Telephone Encounter (Signed)
CRITICAL VALUE STICKER  CRITICAL VALUE: HGB 5.8  RECEIVER (on-site recipient of call): Donzel Romack P. LPN  DATE & TIME NOTIFIED: 07/23/2022 1045 am  MESSENGER (representative from lab): Lauren  MD NOTIFIED: Dr. Mosetta Putt

## 2022-07-23 NOTE — Progress Notes (Signed)
Received VO from Dr. Mosetta Putt can run each unit of PRBC at 357mL/hr

## 2022-07-23 NOTE — Assessment & Plan Note (Signed)
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 -she has required multiple blood transfusions and APCs since 2019. 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  -s/p IR embolization of gastric artery branch vessels on 12/10/17, cauterization of stomach for severe GI bleed in 11/2018, and epinephrine injection for bleeding ulcer on 11/03/20. -She has been non-complaint with her appointments, had no show/late show for last 3 weeks. We previously connected her with transportation service

## 2022-07-24 ENCOUNTER — Other Ambulatory Visit (HOSPITAL_COMMUNITY): Payer: Self-pay

## 2022-07-24 LAB — TYPE AND SCREEN
Antibody Screen: NEGATIVE
Unit division: 0
Unit division: 0

## 2022-07-24 LAB — BPAM RBC
Blood Product Expiration Date: 202405032359
ISSUE DATE / TIME: 202404091341
ISSUE DATE / TIME: 202404091341
Unit Type and Rh: 600
Unit Type and Rh: 600

## 2022-07-25 ENCOUNTER — Other Ambulatory Visit (HOSPITAL_COMMUNITY): Payer: Self-pay

## 2022-07-26 ENCOUNTER — Other Ambulatory Visit (HOSPITAL_COMMUNITY): Payer: Self-pay

## 2022-07-29 ENCOUNTER — Other Ambulatory Visit: Payer: Self-pay | Admitting: *Deleted

## 2022-07-29 ENCOUNTER — Telehealth: Payer: Self-pay | Admitting: *Deleted

## 2022-07-29 MED ORDER — POTASSIUM CHLORIDE CRYS ER 10 MEQ PO TBCR: 10.0000 meq | EXTENDED_RELEASE_TABLET | Freq: Every day | ORAL | 0 refills | Status: DC

## 2022-07-29 NOTE — Telephone Encounter (Signed)
Per Dr.Feng, called pt with message below. Vm was left on pt personal cell with information below and new rx KCL 10 mEq. Advised pt to call office with questions and concerns.

## 2022-07-29 NOTE — Telephone Encounter (Signed)
-----   Message from Malachy Mood, MD sent at 07/27/2022 12:31 PM EDT ----- Please let her know her K was low last week, and please oral oral KCL daily for 14 days, thx  Malachy Mood  07/27/2022

## 2022-08-01 ENCOUNTER — Inpatient Hospital Stay: Payer: Medicaid Other

## 2022-08-05 ENCOUNTER — Other Ambulatory Visit (HOSPITAL_COMMUNITY): Payer: Self-pay

## 2022-08-08 ENCOUNTER — Inpatient Hospital Stay: Payer: Medicaid Other

## 2022-08-15 ENCOUNTER — Inpatient Hospital Stay: Payer: Medicaid Other | Attending: Hematology

## 2022-08-15 ENCOUNTER — Inpatient Hospital Stay: Payer: Medicaid Other

## 2022-08-15 ENCOUNTER — Other Ambulatory Visit: Payer: Self-pay

## 2022-08-15 VITALS — BP 157/61 | HR 71 | Temp 97.6°F | Resp 18

## 2022-08-15 DIAGNOSIS — I780 Hereditary hemorrhagic telangiectasia: Secondary | ICD-10-CM | POA: Diagnosis present

## 2022-08-15 DIAGNOSIS — D500 Iron deficiency anemia secondary to blood loss (chronic): Secondary | ICD-10-CM | POA: Insufficient documentation

## 2022-08-15 DIAGNOSIS — Z95828 Presence of other vascular implants and grafts: Secondary | ICD-10-CM

## 2022-08-15 LAB — CBC WITH DIFFERENTIAL (CANCER CENTER ONLY)
Abs Immature Granulocytes: 0.03 10*3/uL (ref 0.00–0.07)
Basophils Absolute: 0 10*3/uL (ref 0.0–0.1)
Basophils Relative: 1 %
Eosinophils Absolute: 0.1 10*3/uL (ref 0.0–0.5)
Eosinophils Relative: 1 %
HCT: 31.5 % — ABNORMAL LOW (ref 36.0–46.0)
Hemoglobin: 9.3 g/dL — ABNORMAL LOW (ref 12.0–15.0)
Immature Granulocytes: 0 %
Lymphocytes Relative: 15 %
Lymphs Abs: 1.2 10*3/uL (ref 0.7–4.0)
MCH: 22.6 pg — ABNORMAL LOW (ref 26.0–34.0)
MCHC: 29.5 g/dL — ABNORMAL LOW (ref 30.0–36.0)
MCV: 76.6 fL — ABNORMAL LOW (ref 80.0–100.0)
Monocytes Absolute: 0.5 10*3/uL (ref 0.1–1.0)
Monocytes Relative: 6 %
Neutro Abs: 6.3 10*3/uL (ref 1.7–7.7)
Neutrophils Relative %: 77 %
Platelet Count: 393 10*3/uL (ref 150–400)
RBC: 4.11 MIL/uL (ref 3.87–5.11)
RDW: 22.9 % — ABNORMAL HIGH (ref 11.5–15.5)
WBC Count: 8.3 10*3/uL (ref 4.0–10.5)
nRBC: 0 % (ref 0.0–0.2)

## 2022-08-15 LAB — FERRITIN: Ferritin: 11 ng/mL (ref 11–307)

## 2022-08-15 LAB — SAMPLE TO BLOOD BANK

## 2022-08-15 MED ORDER — SODIUM CHLORIDE 0.9 % IV SOLN: INTRAVENOUS | Status: DC

## 2022-08-15 MED ORDER — HEPARIN SOD (PORK) LOCK FLUSH 100 UNIT/ML IV SOLN
500.0000 [IU] | Freq: Once | INTRAVENOUS | Status: AC
  Administered 2022-08-15: 500 [IU]

## 2022-08-15 MED ORDER — SODIUM CHLORIDE 0.9 % IV SOLN
250.0000 mg | Freq: Once | INTRAVENOUS | Status: AC
  Administered 2022-08-15: 250 mg via INTRAVENOUS
  Filled 2022-08-15: qty 20

## 2022-08-15 MED ORDER — SODIUM CHLORIDE 0.9% FLUSH
10.0000 mL | Freq: Once | INTRAVENOUS | Status: AC
  Administered 2022-08-15: 10 mL

## 2022-08-15 NOTE — Progress Notes (Signed)
Sodium Ferric Gluconate Complex:   Lot:  [id number], Exp:01/13/2024 x 2 vials Lot:  [id number], Exp:  01/13/2024 x 2 vials Diluted in NS 250 mL, Lot:  [id number], Exp:  12/14/2023  Richardean Sale, RPH, BCPS, BCOP 08/15/2022 10:49 AM

## 2022-08-15 NOTE — Progress Notes (Signed)
Pt stayed for 30 minute post observation following infusion. D/c to lobby in wheelchair and VSS. See flowsheet.

## 2022-08-16 ENCOUNTER — Encounter: Payer: Self-pay | Admitting: Hematology

## 2022-08-16 NOTE — Addendum Note (Signed)
Addended by: Neita Goodnight on: 08/16/2022 03:50 PM   Modules accepted: Orders

## 2022-08-22 ENCOUNTER — Inpatient Hospital Stay: Payer: Medicaid Other

## 2022-08-22 DIAGNOSIS — I780 Hereditary hemorrhagic telangiectasia: Secondary | ICD-10-CM

## 2022-08-22 DIAGNOSIS — Z95828 Presence of other vascular implants and grafts: Secondary | ICD-10-CM

## 2022-08-22 DIAGNOSIS — D500 Iron deficiency anemia secondary to blood loss (chronic): Secondary | ICD-10-CM

## 2022-08-22 DIAGNOSIS — D649 Anemia, unspecified: Secondary | ICD-10-CM

## 2022-08-22 DIAGNOSIS — D62 Acute posthemorrhagic anemia: Secondary | ICD-10-CM

## 2022-08-22 LAB — CMP (CANCER CENTER ONLY)
ALT: 5 U/L (ref 0–44)
AST: 11 U/L — ABNORMAL LOW (ref 15–41)
Albumin: 3.9 g/dL (ref 3.5–5.0)
Alkaline Phosphatase: 85 U/L (ref 38–126)
Anion gap: 7 (ref 5–15)
BUN: 12 mg/dL (ref 8–23)
CO2: 28 mmol/L (ref 22–32)
Calcium: 8.7 mg/dL — ABNORMAL LOW (ref 8.9–10.3)
Chloride: 107 mmol/L (ref 98–111)
Creatinine: 0.79 mg/dL (ref 0.44–1.00)
GFR, Estimated: >60 mL/min (ref >60)
Glucose, Bld: 103 mg/dL — ABNORMAL HIGH (ref 70–99)
Potassium: 3.4 mmol/L — ABNORMAL LOW (ref 3.5–5.1)
Sodium: 142 mmol/L (ref 135–145)
Total Bilirubin: 0.3 mg/dL (ref 0.3–1.2)
Total Protein: 7.1 g/dL (ref 6.5–8.1)

## 2022-08-22 LAB — CBC WITH DIFFERENTIAL (CANCER CENTER ONLY)
Abs Immature Granulocytes: 0.04 10*3/uL (ref 0.00–0.07)
Basophils Absolute: 0.1 10*3/uL (ref 0.0–0.1)
Basophils Relative: 0 %
Eosinophils Absolute: 0.1 10*3/uL (ref 0.0–0.5)
Eosinophils Relative: 1 %
HCT: 30.9 % — ABNORMAL LOW (ref 36.0–46.0)
Hemoglobin: 9.1 g/dL — ABNORMAL LOW (ref 12.0–15.0)
Immature Granulocytes: 0 %
Lymphocytes Relative: 14 %
Lymphs Abs: 1.6 10*3/uL (ref 0.7–4.0)
MCH: 22.8 pg — ABNORMAL LOW (ref 26.0–34.0)
MCHC: 29.4 g/dL — ABNORMAL LOW (ref 30.0–36.0)
MCV: 77.4 fL — ABNORMAL LOW (ref 80.0–100.0)
Monocytes Absolute: 0.6 10*3/uL (ref 0.1–1.0)
Monocytes Relative: 5 %
Neutro Abs: 9.3 10*3/uL — ABNORMAL HIGH (ref 1.7–7.7)
Neutrophils Relative %: 80 %
Platelet Count: 284 10*3/uL (ref 150–400)
RBC: 3.99 MIL/uL (ref 3.87–5.11)
RDW: 24.1 % — ABNORMAL HIGH (ref 11.5–15.5)
WBC Count: 11.6 10*3/uL — ABNORMAL HIGH (ref 4.0–10.5)
nRBC: 0 % (ref 0.0–0.2)

## 2022-08-22 LAB — SAMPLE TO BLOOD BANK

## 2022-08-22 LAB — TOTAL PROTEIN, URINE DIPSTICK: Protein, ur: 100 mg/dL — AB

## 2022-08-22 MED ORDER — HEPARIN SOD (PORK) LOCK FLUSH 100 UNIT/ML IV SOLN
500.0000 [IU] | Freq: Once | INTRAVENOUS | Status: AC
  Administered 2022-08-22: 500 [IU]

## 2022-08-22 MED ORDER — SODIUM CHLORIDE 0.9% FLUSH
10.0000 mL | Freq: Once | INTRAVENOUS | Status: AC
  Administered 2022-08-22: 10 mL

## 2022-08-23 ENCOUNTER — Other Ambulatory Visit: Payer: Self-pay | Admitting: Hematology

## 2022-08-23 ENCOUNTER — Inpatient Hospital Stay: Payer: Medicaid Other

## 2022-08-23 VITALS — BP 158/85 | HR 69 | Temp 98.5°F | Resp 16 | Wt 230.0 lb

## 2022-08-23 DIAGNOSIS — I780 Hereditary hemorrhagic telangiectasia: Secondary | ICD-10-CM

## 2022-08-23 DIAGNOSIS — D500 Iron deficiency anemia secondary to blood loss (chronic): Secondary | ICD-10-CM

## 2022-08-23 DIAGNOSIS — Z95828 Presence of other vascular implants and grafts: Secondary | ICD-10-CM

## 2022-08-23 MED ORDER — HEPARIN SOD (PORK) LOCK FLUSH 100 UNIT/ML IV SOLN
500.0000 [IU] | Freq: Once | INTRAVENOUS | Status: AC | PRN
  Administered 2022-08-23: 500 [IU]

## 2022-08-23 MED ORDER — SODIUM CHLORIDE 0.9 % IV SOLN: Freq: Once | INTRAVENOUS | Status: AC

## 2022-08-23 MED ORDER — SODIUM CHLORIDE 0.9 % IV SOLN
250.0000 mg | Freq: Once | INTRAVENOUS | Status: AC
  Administered 2022-08-23: 250 mg via INTRAVENOUS
  Filled 2022-08-23: qty 20

## 2022-08-23 MED ORDER — SODIUM CHLORIDE 0.9% FLUSH
10.0000 mL | INTRAVENOUS | Status: DC | PRN
  Administered 2022-08-23: 10 mL

## 2022-08-23 MED ORDER — SODIUM CHLORIDE 0.9 % IV SOLN
5.0000 mg/kg | Freq: Once | INTRAVENOUS | Status: AC
  Administered 2022-08-23: 500 mg via INTRAVENOUS
  Filled 2022-08-23: qty 16

## 2022-08-23 NOTE — Progress Notes (Signed)
Per Dr Mosetta Putt, ok to tx with SBP 169

## 2022-08-23 NOTE — Patient Instructions (Signed)
[facility] AT [facility]  Discharge Instructions: Thank you for choosing [facility] to provide your oncology and hematology care.   If you have a lab appointment with the Cancer Center, please go directly to the Cancer Center and check in at the registration area.   Wear comfortable clothing and clothing appropriate for easy access to any Portacath or PICC line.   We strive to give you quality time with your provider. You may need to reschedule your appointment if you arrive late (15 or more minutes).  Arriving late affects you and other patients whose appointments are after yours.  Also, if you miss three or more appointments without notifying the office, you may be dismissed from the clinic at the provider's discretion.      For prescription refill requests, have your pharmacy contact our office and allow 72 hours for refills to be completed.    Today you received the following chemotherapy and/or immunotherapy agents Ferrlecit, Bevacizumab      To help prevent nausea and vomiting after your treatment, we encourage you to take your nausea medication as directed.  BELOW ARE SYMPTOMS THAT SHOULD BE REPORTED IMMEDIATELY: *FEVER GREATER THAN 100.4 F (38 C) OR HIGHER *CHILLS OR SWEATING *NAUSEA AND VOMITING THAT IS NOT CONTROLLED WITH YOUR NAUSEA MEDICATION *UNUSUAL SHORTNESS OF BREATH *UNUSUAL BRUISING OR BLEEDING *URINARY PROBLEMS (pain or burning when urinating, or frequent urination) *BOWEL PROBLEMS (unusual diarrhea, constipation, pain near the anus) TENDERNESS IN MOUTH AND THROAT WITH OR WITHOUT PRESENCE OF ULCERS (sore throat, sores in mouth, or a toothache) UNUSUAL RASH, SWELLING OR PAIN  UNUSUAL VAGINAL DISCHARGE OR ITCHING   Items with * indicate a potential emergency and should be followed up as soon as possible or go to the Emergency Department if any problems should occur.  Please show the CHEMOTHERAPY ALERT CARD or IMMUNOTHERAPY ALERT  CARD at check-in to the Emergency Department and triage nurse.  Should you have questions after your visit or need to cancel or reschedule your appointment, please contact [facility] AT [facility]  Dept: [phone]  and follow the prompts.  Office hours are 8:00 a.m. to 4:30 p.m. Monday - Friday. Please note that voicemails left after 4:00 p.m. may not be returned until the following business day.  We are closed weekends and major holidays. You have access to a nurse at all times for urgent questions. Please call the main number to the clinic Dept: [phone] and follow the prompts.   For any non-urgent questions, you may also contact your provider using MyChart. We now offer e-Visits for anyone 38 and older to request care online for non-urgent symptoms. For details visit [email or web address].   Also download the MyChart app! Go to the app store, search "MyChart", open the app, select [place], and log in with your MyChart username and password.

## 2022-08-26 DIAGNOSIS — M47816 Spondylosis without myelopathy or radiculopathy, lumbar region: Secondary | ICD-10-CM | POA: Diagnosis not present

## 2022-08-26 DIAGNOSIS — M1712 Unilateral primary osteoarthritis, left knee: Secondary | ICD-10-CM | POA: Diagnosis not present

## 2022-08-29 ENCOUNTER — Inpatient Hospital Stay: Payer: Medicaid Other

## 2022-09-05 ENCOUNTER — Inpatient Hospital Stay: Payer: Medicaid Other

## 2022-09-07 ENCOUNTER — Other Ambulatory Visit: Payer: Self-pay | Admitting: Hematology

## 2022-09-10 ENCOUNTER — Telehealth: Payer: Self-pay | Admitting: Hematology

## 2022-09-10 NOTE — Telephone Encounter (Signed)
Contacted patient to scheduled appointments. Left message with appointment details and a call back number if patient had any questions or could not accommodate the time we provided.   

## 2022-09-11 ENCOUNTER — Encounter: Payer: Self-pay | Admitting: Hematology

## 2022-09-12 ENCOUNTER — Inpatient Hospital Stay: Payer: Medicaid Other

## 2022-09-19 ENCOUNTER — Inpatient Hospital Stay: Payer: Medicaid Other | Attending: Hematology

## 2022-09-19 ENCOUNTER — Inpatient Hospital Stay (HOSPITAL_BASED_OUTPATIENT_CLINIC_OR_DEPARTMENT_OTHER): Payer: Medicaid Other | Admitting: Hematology

## 2022-09-19 ENCOUNTER — Encounter: Payer: Self-pay | Admitting: Hematology

## 2022-09-19 ENCOUNTER — Inpatient Hospital Stay: Payer: Medicaid Other

## 2022-09-19 ENCOUNTER — Other Ambulatory Visit: Payer: Medicaid Other

## 2022-09-19 ENCOUNTER — Other Ambulatory Visit: Payer: Self-pay

## 2022-09-19 ENCOUNTER — Inpatient Hospital Stay: Payer: Medicaid Other | Admitting: Hematology

## 2022-09-19 VITALS — BP 143/64 | HR 58 | Temp 97.9°F | Resp 18 | Ht 65.0 in | Wt 229.6 lb

## 2022-09-19 VITALS — BP 141/75 | HR 59 | Resp 18

## 2022-09-19 DIAGNOSIS — D500 Iron deficiency anemia secondary to blood loss (chronic): Secondary | ICD-10-CM | POA: Insufficient documentation

## 2022-09-19 DIAGNOSIS — I780 Hereditary hemorrhagic telangiectasia: Secondary | ICD-10-CM | POA: Insufficient documentation

## 2022-09-19 DIAGNOSIS — Z95828 Presence of other vascular implants and grafts: Secondary | ICD-10-CM

## 2022-09-19 DIAGNOSIS — Q2730 Arteriovenous malformation, site unspecified: Secondary | ICD-10-CM | POA: Diagnosis not present

## 2022-09-19 LAB — CBC WITH DIFFERENTIAL (CANCER CENTER ONLY)
Abs Immature Granulocytes: 0.03 10*3/uL (ref 0.00–0.07)
Basophils Absolute: 0.1 10*3/uL (ref 0.0–0.1)
Basophils Relative: 1 %
Eosinophils Absolute: 0.1 10*3/uL (ref 0.0–0.5)
Eosinophils Relative: 2 %
HCT: 27.8 % — ABNORMAL LOW (ref 36.0–46.0)
Hemoglobin: 8.4 g/dL — ABNORMAL LOW (ref 12.0–15.0)
Immature Granulocytes: 0 %
Lymphocytes Relative: 18 %
Lymphs Abs: 1.6 10*3/uL (ref 0.7–4.0)
MCH: 24.9 pg — ABNORMAL LOW (ref 26.0–34.0)
MCHC: 30.2 g/dL (ref 30.0–36.0)
MCV: 82.2 fL (ref 80.0–100.0)
Monocytes Absolute: 0.4 10*3/uL (ref 0.1–1.0)
Monocytes Relative: 4 %
Neutro Abs: 6.7 10*3/uL (ref 1.7–7.7)
Neutrophils Relative %: 75 %
Platelet Count: 234 10*3/uL (ref 150–400)
RBC: 3.38 MIL/uL — ABNORMAL LOW (ref 3.87–5.11)
RDW: 26.5 % — ABNORMAL HIGH (ref 11.5–15.5)
WBC Count: 8.9 10*3/uL (ref 4.0–10.5)
nRBC: 0 % (ref 0.0–0.2)

## 2022-09-19 LAB — FERRITIN: Ferritin: 16 ng/mL (ref 11–307)

## 2022-09-19 LAB — SAMPLE TO BLOOD BANK

## 2022-09-19 LAB — TOTAL PROTEIN, URINE DIPSTICK: Protein, ur: 100 mg/dL — AB

## 2022-09-19 MED ORDER — PANTOPRAZOLE SODIUM 40 MG PO TBEC: 40.0000 mg | DELAYED_RELEASE_TABLET | Freq: Two times a day (BID) | ORAL | 1 refills | Status: DC

## 2022-09-19 MED ORDER — HEPARIN SOD (PORK) LOCK FLUSH 100 UNIT/ML IV SOLN
500.0000 [IU] | Freq: Once | INTRAVENOUS | Status: AC | PRN
  Administered 2022-09-19: 500 [IU]

## 2022-09-19 MED ORDER — SODIUM CHLORIDE 0.9% FLUSH
10.0000 mL | INTRAVENOUS | Status: DC | PRN
  Administered 2022-09-19: 10 mL

## 2022-09-19 MED ORDER — HEPARIN SOD (PORK) LOCK FLUSH 100 UNIT/ML IV SOLN: 500.0000 [IU] | Freq: Once | INTRAVENOUS | Status: DC

## 2022-09-19 MED ORDER — SODIUM CHLORIDE 0.9% FLUSH: 10.0000 mL | Freq: Once | INTRAVENOUS | Status: DC

## 2022-09-19 MED ORDER — SODIUM CHLORIDE 0.9 % IV SOLN
250.0000 mg | Freq: Once | INTRAVENOUS | Status: AC
  Administered 2022-09-19: 250 mg via INTRAVENOUS
  Filled 2022-09-19: qty 20

## 2022-09-19 MED ORDER — SODIUM CHLORIDE 0.9% FLUSH
10.0000 mL | Freq: Once | INTRAVENOUS | Status: AC
  Administered 2022-09-19: 10 mL

## 2022-09-19 MED ORDER — SODIUM CHLORIDE 0.9 % IV SOLN: INTRAVENOUS | Status: DC

## 2022-09-19 MED ORDER — SODIUM CHLORIDE 0.9 % IV SOLN
5.0000 mg/kg | Freq: Once | INTRAVENOUS | Status: AC
  Administered 2022-09-19: 500 mg via INTRAVENOUS
  Filled 2022-09-19: qty 16

## 2022-09-19 NOTE — Assessment & Plan Note (Signed)
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 -she has required multiple blood transfusions and APCs since 2019. 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 -She had multiple prolonged hospital stay due to severe GI bleeding.   -s/p IR embolization of gastric artery branch vessels on 12/10/17, cauterization of stomach for severe GI bleed in 11/2018, and epinephrine injection for bleeding ulcer on 11/03/20. -She has required frequent blood transfusion, IV iron and bevacizumab -She does respond to treatment, anemia improved with IV iron and bevacizumab, but she is not very compliant with her appointments.

## 2022-09-19 NOTE — Progress Notes (Signed)
[facility]   [phone] [phone]   Clinic Follow up Note   Patient Care Team: Olegario Messier, MD as PCP - Serita Grit, MD as Attending Physician (Hematology and Oncology)  Date of Service:  09/19/2022  CHIEF COMPLAINT: f/u of HHT and anemia   CURRENT THERAPY:  -Blood transfusion for Hg<8.0 (2units on different days if Hg<7) -iv ferric gluconate, with ferritin goal 100-200.  -Avastin/Zirabev restarted on 04/03/18. Currently given PRN due to proteinuria. -Amicar 1g BID, intermittently since 07/17/18  ASSESSMENT:  Peggy House is a 62 y.o. 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 -she has required multiple blood transfusions and APCs since 2019. 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 -She had multiple prolonged hospital stay due to severe GI bleeding.   -s/p IR embolization of gastric artery branch vessels on 12/10/17, cauterization of stomach for severe GI bleed in 11/2018, and epinephrine injection for bleeding ulcer on 11/03/20. -She has required frequent blood transfusion, IV iron and bevacizumab -She does respond to treatment, anemia improved with IV iron and bevacizumab, but she is not very compliant with her appointments. -She is clinically stable, still has intermittent epistaxis.  Lab reviewed, hemoglobin 8.4, ferritin 16, will proceed to Avastin and ferric gluconate today.  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 She was last seen by me on 07/23/2022. She presents to the clinic alone. Pt sate that she feels a little tired. Pt has lost her husband due to an accident. Pt state that she had a nose bleed last week and it did not last long.       All other systems were reviewed with the patient and are negative.  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    SURGICAL HISTORY: Past Surgical History:  Procedure Laterality Date   ABDOMINAL HYSTERECTOMY     CARPAL TUNNEL RELEASE  05/13/2011   Procedure: CARPAL TUNNEL RELEASE;  Surgeon: Mable Paris, MD;  Location: [facility];  Service: Orthopedics;  Laterality: Left;   COLONOSCOPY N/A 03/02/2020   Procedure: COLONOSCOPY;  Surgeon: Bernette Redbird, MD;  Location: [facility] ENDOSCOPY;  Service: Endoscopy;  Laterality: N/A;   COLONOSCOPY WITH PROPOFOL N/A 04/28/2014   Procedure: COLONOSCOPY WITH PROPOFOL;  Surgeon: Florencia Reasons, MD;  Location: [facility] ENDOSCOPY;  Service: Endoscopy;  Laterality: N/A;   DG TOES*L*  2/10   rt   DILATION AND CURETTAGE  OF UTERUS     ENTEROSCOPY N/A 10/17/2017   Procedure: ENTEROSCOPY;  Surgeon: Kathi Der, MD;  Location: [facility]COPY;  Service: Gastroenterology;  Laterality: N/A;   ESOPHAGOGASTRODUODENOSCOPY N/A 04/10/2014   Procedure: ESOPHAGOGASTRODUODENOSCOPY (EGD);  Surgeon: Shirley Friar, MD;  Location: [facility] ENDOSCOPY;  Service: Endoscopy;  Laterality: N/A;   ESOPHAGOGASTRODUODENOSCOPY N/A 05/10/2017    Procedure: ESOPHAGOGASTRODUODENOSCOPY (EGD);  Surgeon: Bernette Redbird, MD;  Location: [facility] ENDOSCOPY;  Service: Endoscopy;  Laterality: N/A;   ESOPHAGOGASTRODUODENOSCOPY N/A 09/22/2017   Procedure: ESOPHAGOGASTRODUODENOSCOPY (EGD);  Surgeon: Vida Rigger, MD;  Location: [facility] ENDOSCOPY;  Service: Endoscopy;  Laterality: N/A;  bedside   ESOPHAGOGASTRODUODENOSCOPY N/A 03/02/2020   Procedure: ESOPHAGOGASTRODUODENOSCOPY (EGD);  Surgeon: Bernette Redbird, MD;  Location: [facility] ENDOSCOPY;  Service: Endoscopy;  Laterality: N/A;   ESOPHAGOGASTRODUODENOSCOPY N/A 11/03/2020   Procedure: ESOPHAGOGASTRODUODENOSCOPY (EGD);  Surgeon: Charlott Rakes, MD;  Location: [facility] ENDOSCOPY;  Service: Endoscopy;  Laterality: N/A;   ESOPHAGOGASTRODUODENOSCOPY N/A 04/12/2022   Procedure: ESOPHAGOGASTRODUODENOSCOPY (EGD);  Surgeon: Vida Rigger, MD;  Location: [facility] ENDOSCOPY;  Service: Gastroenterology;  Laterality: N/A;   ESOPHAGOGASTRODUODENOSCOPY N/A 05/27/2022   Procedure: ESOPHAGOGASTRODUODENOSCOPY (EGD);  Surgeon: Willis Modena, MD;  Location: [facility] ENDOSCOPY;  Service: Gastroenterology;  Laterality: N/A;   ESOPHAGOGASTRODUODENOSCOPY (EGD) WITH PROPOFOL N/A 04/27/2014   Procedure: ESOPHAGOGASTRODUODENOSCOPY (EGD) WITH PROPOFOL;  Surgeon: Florencia Reasons, MD;  Location: [facility] ENDOSCOPY;  Service: Endoscopy;  Laterality: N/A;  possible apc   ESOPHAGOGASTRODUODENOSCOPY (EGD) WITH PROPOFOL N/A 09/30/2017   Procedure: ESOPHAGOGASTRODUODENOSCOPY (EGD) WITH PROPOFOL;  Surgeon: Kerin Salen, MD;  Location: [facility] ENDOSCOPY;  Service: Gastroenterology;  Laterality: N/A;   ESOPHAGOGASTRODUODENOSCOPY (EGD) WITH PROPOFOL N/A 10/01/2017   Procedure: ESOPHAGOGASTRODUODENOSCOPY (EGD) WITH PROPOFOL;  Surgeon: Kerin Salen, MD;  Location: [facility] ENDOSCOPY;  Service: Gastroenterology;  Laterality: N/A;   ESOPHAGOGASTRODUODENOSCOPY (EGD) WITH PROPOFOL N/A 10/08/2017   Procedure: ESOPHAGOGASTRODUODENOSCOPY (EGD) WITH PROPOFOL;  Surgeon: Kathi Der, MD;  Location:  [facility] ENDOSCOPY;  Service: Gastroenterology;  Laterality: N/A;   ESOPHAGOGASTRODUODENOSCOPY (EGD) WITH PROPOFOL N/A 10/17/2017   Procedure: ESOPHAGOGASTRODUODENOSCOPY (EGD) WITH PROPOFOL;  Surgeon: Kathi Der, MD;  Location: [facility] ENDOSCOPY;  Service: Gastroenterology;  Laterality: N/A;   ESOPHAGOGASTRODUODENOSCOPY (EGD) WITH PROPOFOL N/A 10/19/2017   Procedure: ESOPHAGOGASTRODUODENOSCOPY (EGD) WITH PROPOFOL;  Surgeon: Kathi Der, MD;  Location: [facility] ENDOSCOPY;  Service: Gastroenterology;  Laterality: N/A;   ESOPHAGOGASTRODUODENOSCOPY (EGD) WITH PROPOFOL N/A 12/04/2018   Procedure: ESOPHAGOGASTRODUODENOSCOPY (EGD) WITH PROPOFOL;  Surgeon: Charlott Rakes, MD;  Location: [facility] ENDOSCOPY;  Service: Endoscopy;  Laterality: N/A;   GIVENS CAPSULE STUDY N/A 10/02/2017   Procedure: GIVENS CAPSULE STUDY;  Surgeon: Kerin Salen, MD;  Location: [facility] ENDOSCOPY;  Service: Gastroenterology;  Laterality: N/A;   GIVENS CAPSULE STUDY N/A 10/08/2017   Procedure: GIVENS CAPSULE STUDY;  Surgeon: Kathi Der, MD;  Location: [facility] ENDOSCOPY;  Service: Gastroenterology;  Laterality: N/A;  endoscopic placement of capsule   GIVENS CAPSULE STUDY N/A 03/02/2020   Procedure: GIVENS CAPSULE STUDY;  Surgeon: Bernette Redbird, MD;  Location: [facility] ENDOSCOPY;  Service: Endoscopy;  Laterality: N/A;   HEMOSTASIS CLIP PLACEMENT  12/04/2018   Procedure: HEMOSTASIS CLIP PLACEMENT;  Surgeon: Charlott Rakes, MD;  Location: [facility] ENDOSCOPY;  Service: Endoscopy;;   HEMOSTASIS CLIP PLACEMENT  05/27/2022   Procedure: HEMOSTASIS CLIP PLACEMENT;  Surgeon: Willis Modena, MD;  Location: [facility] ENDOSCOPY;  Service: Gastroenterology;;   HEMOSTASIS CONTROL  05/27/2022   Procedure: HEMOSTASIS CONTROL;  Surgeon: Willis Modena, MD;  Location: [facility] ENDOSCOPY;  Service: Gastroenterology;;   HOT HEMOSTASIS N/A 04/27/2014   Procedure: HOT HEMOSTASIS (ARGON PLASMA COAGULATION/BICAP);  Surgeon:  Florencia Reasons, MD;  Location: [facility] ENDOSCOPY;  Service: Endoscopy;   Laterality: N/A;   HOT HEMOSTASIS N/A 09/30/2017   Procedure: HOT HEMOSTASIS (ARGON PLASMA COAGULATION/BICAP);  Surgeon: Kerin Salen, MD;  Location: [facility] ENDOSCOPY;  Service: Gastroenterology;  Laterality: N/A;   HOT HEMOSTASIS N/A 10/01/2017   Procedure: HOT HEMOSTASIS (ARGON PLASMA COAGULATION/BICAP);  Surgeon: Kerin Salen, MD;  Location: [facility] ENDOSCOPY;  Service: Gastroenterology;  Laterality: N/A;   HOT HEMOSTASIS N/A 10/17/2017   Procedure: HOT HEMOSTASIS (ARGON PLASMA COAGULATION/BICAP);  Surgeon: Kathi Der, MD;  Location: [facility] ENDOSCOPY;  Service: Gastroenterology;  Laterality: N/A;   HOT HEMOSTASIS N/A 10/19/2017   Procedure: HOT HEMOSTASIS (ARGON PLASMA COAGULATION/BICAP);  Surgeon: Kathi Der, MD;  Location: [facility] ENDOSCOPY;  Service: Gastroenterology;  Laterality: N/A;   HOT HEMOSTASIS N/A 03/02/2020   Procedure: HOT HEMOSTASIS (ARGON PLASMA COAGULATION/BICAP);  Surgeon: Bernette Redbird, MD;  Location: [facility] ENDOSCOPY;  Service: Endoscopy;  Laterality: N/A;   HOT HEMOSTASIS N/A 04/12/2022   Procedure: HOT HEMOSTASIS (ARGON PLASMA COAGULATION/BICAP);  Surgeon: Vida Rigger, MD;  Location: [facility] ENDOSCOPY;  Service: Gastroenterology;  Laterality: N/A;   IR IMAGING GUIDED PORT INSERTION  07/08/2018   L shoulder Surgery  2011   POLYPECTOMY  03/02/2020   Procedure: POLYPECTOMY;  Surgeon: Bernette Redbird, MD;  Location: [facility] ENDOSCOPY;  Service: Endoscopy;;   SCLEROTHERAPY  11/03/2020   Procedure: Susa Day;  Surgeon: Charlott Rakes, MD;  Location: [facility] ENDOSCOPY;  Service: Endoscopy;;   SUBMUCOSAL INJECTION  09/22/2017   Procedure: SUBMUCOSAL INJECTION;  Surgeon: Vida Rigger, MD;  Location: [facility] ENDOSCOPY;  Service: Endoscopy;;   SUBMUCOSAL INJECTION  12/04/2018   Procedure: SUBMUCOSAL INJECTION;  Surgeon: Charlott Rakes, MD;  Location: [facility] ENDOSCOPY;  Service: Endoscopy;;    I have reviewed the social history and family history with the patient and they are unchanged from previous  note.  ALLERGIES:  is allergic to feraheme [ferumoxytol], nsaids, tomato, iron (ferrous sulfate) [ferrous sulfate er], and wasp venom.  MEDICATIONS:  Current Outpatient Medications  Medication Sig Dispense Refill   potassium chloride (KLOR-CON M) 10 MEQ tablet Take 1 tablet (10 mEq total) by mouth daily. 14 tablet 0   Accu-Chek Softclix Lancets lancets Use to check blood sugar before breakfast and before dinner while on steroids (Patient taking differently: 1 each by Other route See admin instructions. Use to check blood sugar before breakfast and before dinner while on steroids) 100 each 1   ALPRAZolam (XANAX) 0.5 MG tablet TAKE 1 TABLET BY MOUTH AT BEDTIME AS NEEDED FOR ANXIETY OR SLEEP 30 tablet 0   Aminocaproic Acid 1000 MG TABS Take 1 tablet (1,000 mg total) by mouth in the morning and at bedtime. 60 tablet 2   amLODipine (NORVASC) 10 MG tablet TAKE 1 TABLET(10 MG) BY MOUTH DAILY 90 tablet 3   citalopram (CELEXA) 20 MG tablet Take 1 tablet (20 mg total) by mouth daily. 90 tablet 3   glucose blood (ACCU-CHEK GUIDE) test strip Check blood sugar 2 times per day while on steroids before breakfast and dinner (Patient taking differently: 1 each by Other route See admin instructions. Check blood sugar 2 times per day while on steroids before breakfast and dinner) 50 each 3   lidocaine-prilocaine (EMLA) cream Apply 1 application topically as needed. (Patient taking differently: Apply 1 application  topically as needed (for port access).) 30 g 0   lisinopril (ZESTRIL) 20 MG tablet Take 1 tablet (20 mg total) by mouth daily. 90 tablet 3   metFORMIN (GLUCOPHAGE) 500 MG tablet TAKE  1 TABLET(500 MG) BY MOUTH TWICE DAILY WITH A MEAL (Patient taking differently: Take 500 mg by mouth 2 (two) times daily with a meal.) 180 tablet 3   nicotine (NICODERM CQ - DOSED IN MG/24 HOURS) 14 mg/24hr patch Place 1 patch (14 mg total) onto the skin daily. 30 patch 3   ondansetron (ZOFRAN) 4 MG tablet Take 1 tablet (4 mg  total) by mouth every 8 (eight) hours as needed for nausea or vomiting. 20 tablet 0   pantoprazole (PROTONIX) 40 MG tablet Take 1 tablet (40 mg total) by mouth 2 (two) times daily. 60 tablet 1   Podiatric Products (FLEXITOL HEEL BALM) OINT Apply 1 application  topically as needed (to the feet- for dry skin).     PROAIR HFA 108 (90 Base) MCG/ACT inhaler INHALE 1 TO 2 PUFFS INTO THE LUNGS EVERY 6 HOURS AS NEEDED FOR WHEEZING OR SHORTNESS OF BREATH (Patient taking differently: Inhale 1-2 puffs into the lungs every 6 (six) hours as needed for wheezing or shortness of breath.) 8.5 g 0   terbinafine (LAMISIL AT) 1 % cream Apply 1 application topically 2 (two) times daily. Both bottom and top of both feet and toes (Patient taking differently: Apply 1 application  topically 2 (two) times daily as needed (for affected areas of both feet and toes- bottoms and tops).) 36 g 0   traZODone (DESYREL) 100 MG tablet TAKE 1 TABLET(100 MG) BY MOUTH AT BEDTIME 30 tablet 2   TYLENOL PM EXTRA STRENGTH 500-25 MG TABS tablet Take 2 tablets by mouth at bedtime as needed (for sleep).     No current facility-administered medications for this visit.   Facility-Administered Medications Ordered in Other Visits  Medication Dose Route Frequency Provider Last Rate Last Admin   0.9 %  sodium chloride infusion   Intravenous Continuous Malachy Mood, MD 20 mL/hr at 09/19/22 1057 New Bag at 09/19/22 1057   bevacizumab-adcd (VEGZELMA) 500 mg in sodium chloride 0.9 % 100 mL chemo infusion  5 mg/kg (Treatment Plan Recorded) Intravenous Once Malachy Mood, MD       diphenhydrAMINE (BENADRYL) 25 mg capsule            diphenhydrAMINE (BENADRYL) 25 mg capsule            heparin lock flush 100 unit/mL  500 Units Intracatheter Once Malachy Mood, MD       heparin lock flush 100 unit/mL  500 Units Intracatheter Once PRN Malachy Mood, MD       phenylephrine (NEO-SYNEPHRINE) 1 % nasal drops 2 drop  2 drop Left Nare Once Malachy Mood, MD       sodium chloride  flush (NS) 0.9 % injection 10 mL  10 mL Intracatheter Once Malachy Mood, MD       sodium chloride flush (NS) 0.9 % injection 10 mL  10 mL Intracatheter PRN Malachy Mood, MD        PHYSICAL EXAMINATION: ECOG PERFORMANCE STATUS: 2 - Symptomatic, <50% confined to bed  Vitals:   09/19/22 1016  BP: (!) 143/64  Pulse: (!) 58  Resp: 18  Temp: 97.9 F (36.6 C)  SpO2: 100%   Wt Readings from Last 3 Encounters:  09/19/22 229 lb 9.6 oz (104.1 kg)  08/23/22 230 lb (104.3 kg)  07/23/22 227 lb 12.8 oz (103.3 kg)     GENERAL:alert, no distress and comfortable SKIN: skin color normal, no rashes or significant lesions EYES: normal, Conjunctiva are pink and non-injected, sclera clear  NEURO: alert & oriented  x 3 with fluent speech LABORATORY DATA:  I have reviewed the data as listed    Latest Ref Rng & Units 09/19/2022    9:49 AM 08/22/2022    9:17 AM 08/15/2022    8:30 AM  CBC  WBC 4.0 - 10.5 K/uL 8.9  11.6  8.3   Hemoglobin 12.0 - 15.0 g/dL 8.4  9.1  9.3   Hematocrit 36.0 - 46.0 % 27.8  30.9  31.5   Platelets 150 - 400 K/uL 234  284  393         Latest Ref Rng & Units 08/22/2022    9:17 AM 07/23/2022   10:06 AM 05/29/2022    6:20 AM  CMP  Glucose 70 - 99 mg/dL 161  096  045   BUN 8 - 23 mg/dL 12  10  9    Creatinine 0.44 - 1.00 mg/dL 4.09  8.11  9.14   Sodium 135 - 145 mmol/L 142  143  139   Potassium 3.5 - 5.1 mmol/L 3.4  3.1  3.7   Chloride 98 - 111 mmol/L 107  110  105   CO2 22 - 32 mmol/L 28  25  24    Calcium 8.9 - 10.3 mg/dL 8.7  9.0  8.5   Total Protein 6.5 - 8.1 g/dL 7.1  6.9    Total Bilirubin 0.3 - 1.2 mg/dL 0.3  0.2    Alkaline Phos 38 - 126 U/L 85  79    AST 15 - 41 U/L 11  8    ALT 0 - 44 U/L 5  <5        RADIOGRAPHIC STUDIES: I have personally reviewed the radiological images as listed and agreed with the findings in the report. No results found.    Orders Placed This Encounter  Procedures   CBC with Differential (Cancer Center Only)    Standing Status:   Future     Standing Expiration Date:   11/14/2023   Total Protein, Urine dipstick    Standing Status:   Future    Standing Expiration Date:   11/14/2023   CBC with Differential (Cancer Center Only)    Standing Status:   Future    Standing Expiration Date:   11/28/2023   Total Protein, Urine dipstick    Standing Status:   Future    Standing Expiration Date:   11/28/2023   All questions were answered. The patient knows to call the clinic with any problems, questions or concerns. No barriers to learning was detected. The total time spent in the appointment was 25 minutes.     Malachy Mood, MD 09/19/2022   Carolin Coy, CMA, am acting as scribe for Malachy Mood, MD.   I have reviewed the above documentation for accuracy and completeness, and I agree with the above.

## 2022-09-19 NOTE — Assessment & Plan Note (Signed)
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   

## 2022-09-25 ENCOUNTER — Other Ambulatory Visit: Payer: Self-pay

## 2022-09-25 ENCOUNTER — Inpatient Hospital Stay (HOSPITAL_COMMUNITY)
Admission: EM | Admit: 2022-09-25 | Discharge: 2022-09-28 | DRG: 378 | Disposition: A | Discharge status: Home / Self Care | Payer: Medicaid Other | Attending: Internal Medicine | Admitting: Internal Medicine

## 2022-09-25 ENCOUNTER — Emergency Department (HOSPITAL_COMMUNITY): Payer: Medicaid Other

## 2022-09-25 ENCOUNTER — Encounter (HOSPITAL_COMMUNITY): Payer: Self-pay

## 2022-09-25 DIAGNOSIS — E1159 Type 2 diabetes mellitus with other circulatory complications: Secondary | ICD-10-CM | POA: Diagnosis present

## 2022-09-25 DIAGNOSIS — K921 Melena: Secondary | ICD-10-CM | POA: Diagnosis present

## 2022-09-25 DIAGNOSIS — A084 Viral intestinal infection, unspecified: Secondary | ICD-10-CM | POA: Diagnosis present

## 2022-09-25 DIAGNOSIS — R110 Nausea: Secondary | ICD-10-CM | POA: Diagnosis present

## 2022-09-25 DIAGNOSIS — M25562 Pain in left knee: Secondary | ICD-10-CM | POA: Diagnosis present

## 2022-09-25 DIAGNOSIS — R040 Epistaxis: Secondary | ICD-10-CM | POA: Diagnosis present

## 2022-09-25 DIAGNOSIS — R509 Fever, unspecified: Secondary | ICD-10-CM | POA: Diagnosis not present

## 2022-09-25 DIAGNOSIS — E869 Volume depletion, unspecified: Secondary | ICD-10-CM | POA: Diagnosis present

## 2022-09-25 DIAGNOSIS — E872 Acidosis, unspecified: Secondary | ICD-10-CM | POA: Diagnosis present

## 2022-09-25 DIAGNOSIS — Z91030 Bee allergy status: Secondary | ICD-10-CM

## 2022-09-25 DIAGNOSIS — Z888 Allergy status to other drugs, medicaments and biological substances status: Secondary | ICD-10-CM

## 2022-09-25 DIAGNOSIS — R5381 Other malaise: Secondary | ICD-10-CM

## 2022-09-25 DIAGNOSIS — K449 Diaphragmatic hernia without obstruction or gangrene: Secondary | ICD-10-CM | POA: Diagnosis present

## 2022-09-25 DIAGNOSIS — G8929 Other chronic pain: Secondary | ICD-10-CM | POA: Diagnosis present

## 2022-09-25 DIAGNOSIS — K31811 Angiodysplasia of stomach and duodenum with bleeding: Principal | ICD-10-CM | POA: Diagnosis present

## 2022-09-25 DIAGNOSIS — R197 Diarrhea, unspecified: Secondary | ICD-10-CM | POA: Diagnosis present

## 2022-09-25 DIAGNOSIS — F17210 Nicotine dependence, cigarettes, uncomplicated: Secondary | ICD-10-CM | POA: Diagnosis present

## 2022-09-25 DIAGNOSIS — F419 Anxiety disorder, unspecified: Secondary | ICD-10-CM | POA: Diagnosis present

## 2022-09-25 DIAGNOSIS — I10 Essential (primary) hypertension: Secondary | ICD-10-CM | POA: Diagnosis not present

## 2022-09-25 DIAGNOSIS — E785 Hyperlipidemia, unspecified: Secondary | ICD-10-CM | POA: Diagnosis present

## 2022-09-25 DIAGNOSIS — F339 Major depressive disorder, recurrent, unspecified: Secondary | ICD-10-CM | POA: Diagnosis present

## 2022-09-25 DIAGNOSIS — W449XXA Unspecified foreign body entering into or through a natural orifice, initial encounter: Secondary | ICD-10-CM | POA: Diagnosis present

## 2022-09-25 DIAGNOSIS — M1990 Unspecified osteoarthritis, unspecified site: Secondary | ICD-10-CM | POA: Diagnosis present

## 2022-09-25 DIAGNOSIS — R112 Nausea with vomiting, unspecified: Secondary | ICD-10-CM | POA: Diagnosis not present

## 2022-09-25 DIAGNOSIS — E669 Obesity, unspecified: Secondary | ICD-10-CM | POA: Diagnosis present

## 2022-09-25 DIAGNOSIS — D62 Acute posthemorrhagic anemia: Secondary | ICD-10-CM | POA: Diagnosis present

## 2022-09-25 DIAGNOSIS — I959 Hypotension, unspecified: Secondary | ICD-10-CM | POA: Diagnosis not present

## 2022-09-25 DIAGNOSIS — D509 Iron deficiency anemia, unspecified: Secondary | ICD-10-CM | POA: Diagnosis not present

## 2022-09-25 DIAGNOSIS — D649 Anemia, unspecified: Secondary | ICD-10-CM | POA: Diagnosis present

## 2022-09-25 DIAGNOSIS — Z5941 Food insecurity: Secondary | ICD-10-CM

## 2022-09-25 DIAGNOSIS — I780 Hereditary hemorrhagic telangiectasia: Secondary | ICD-10-CM | POA: Diagnosis present

## 2022-09-25 DIAGNOSIS — Z833 Family history of diabetes mellitus: Secondary | ICD-10-CM

## 2022-09-25 DIAGNOSIS — K254 Chronic or unspecified gastric ulcer with hemorrhage: Secondary | ICD-10-CM | POA: Diagnosis present

## 2022-09-25 DIAGNOSIS — Z9109 Other allergy status, other than to drugs and biological substances: Secondary | ICD-10-CM

## 2022-09-25 DIAGNOSIS — R42 Dizziness and giddiness: Secondary | ICD-10-CM | POA: Diagnosis present

## 2022-09-25 DIAGNOSIS — K8020 Calculus of gallbladder without cholecystitis without obstruction: Secondary | ICD-10-CM | POA: Diagnosis not present

## 2022-09-25 DIAGNOSIS — I700 Atherosclerosis of aorta: Secondary | ICD-10-CM | POA: Diagnosis not present

## 2022-09-25 DIAGNOSIS — K219 Gastro-esophageal reflux disease without esophagitis: Secondary | ICD-10-CM | POA: Diagnosis present

## 2022-09-25 DIAGNOSIS — R0602 Shortness of breath: Secondary | ICD-10-CM | POA: Diagnosis present

## 2022-09-25 DIAGNOSIS — Z800 Family history of malignant neoplasm of digestive organs: Secondary | ICD-10-CM

## 2022-09-25 DIAGNOSIS — G479 Sleep disorder, unspecified: Secondary | ICD-10-CM | POA: Diagnosis present

## 2022-09-25 DIAGNOSIS — R1084 Generalized abdominal pain: Secondary | ICD-10-CM | POA: Diagnosis not present

## 2022-09-25 DIAGNOSIS — Z8041 Family history of malignant neoplasm of ovary: Secondary | ICD-10-CM

## 2022-09-25 DIAGNOSIS — Z832 Family history of diseases of the blood and blood-forming organs and certain disorders involving the immune mechanism: Secondary | ICD-10-CM

## 2022-09-25 DIAGNOSIS — Z886 Allergy status to analgesic agent status: Secondary | ICD-10-CM

## 2022-09-25 DIAGNOSIS — Z7984 Long term (current) use of oral hypoglycemic drugs: Secondary | ICD-10-CM

## 2022-09-25 DIAGNOSIS — T182XXA Foreign body in stomach, initial encounter: Secondary | ICD-10-CM | POA: Diagnosis present

## 2022-09-25 DIAGNOSIS — Z79899 Other long term (current) drug therapy: Secondary | ICD-10-CM

## 2022-09-25 DIAGNOSIS — E119 Type 2 diabetes mellitus without complications: Secondary | ICD-10-CM | POA: Diagnosis present

## 2022-09-25 DIAGNOSIS — Z841 Family history of disorders of kidney and ureter: Secondary | ICD-10-CM

## 2022-09-25 DIAGNOSIS — E1151 Type 2 diabetes mellitus with diabetic peripheral angiopathy without gangrene: Secondary | ICD-10-CM | POA: Diagnosis present

## 2022-09-25 DIAGNOSIS — Z6837 Body mass index (BMI) 37.0-37.9, adult: Secondary | ICD-10-CM

## 2022-09-25 DIAGNOSIS — I499 Cardiac arrhythmia, unspecified: Secondary | ICD-10-CM | POA: Diagnosis not present

## 2022-09-25 LAB — I-STAT CHEM 8, ED
BUN: 12 mg/dL (ref 8–23)
Calcium, Ion: 1.15 mmol/L (ref 1.15–1.40)
Chloride: 109 mmol/L (ref 98–111)
Creatinine, Ser: 0.9 mg/dL (ref 0.44–1.00)
Glucose, Bld: 100 mg/dL — ABNORMAL HIGH (ref 70–99)
HCT: 24 % — ABNORMAL LOW (ref 36.0–46.0)
Hemoglobin: 8.2 g/dL — ABNORMAL LOW (ref 12.0–15.0)
Potassium: 3.7 mmol/L (ref 3.5–5.1)
Sodium: 141 mmol/L (ref 135–145)
TCO2: 24 mmol/L (ref 22–32)

## 2022-09-25 LAB — CBC WITH DIFFERENTIAL/PLATELET
Abs Immature Granulocytes: 0.07 10*3/uL (ref 0.00–0.07)
Basophils Absolute: 0.1 10*3/uL (ref 0.0–0.1)
Basophils Relative: 0 %
Eosinophils Absolute: 0.1 10*3/uL (ref 0.0–0.5)
Eosinophils Relative: 1 %
HCT: 24.3 % — ABNORMAL LOW (ref 36.0–46.0)
Hemoglobin: 7.1 g/dL — ABNORMAL LOW (ref 12.0–15.0)
Immature Granulocytes: 1 %
Lymphocytes Relative: 7 %
Lymphs Abs: 1 10*3/uL (ref 0.7–4.0)
MCH: 25.4 pg — ABNORMAL LOW (ref 26.0–34.0)
MCHC: 29.2 g/dL — ABNORMAL LOW (ref 30.0–36.0)
MCV: 87.1 fL (ref 80.0–100.0)
Monocytes Absolute: 0.7 10*3/uL (ref 0.1–1.0)
Monocytes Relative: 5 %
Neutro Abs: 12.9 10*3/uL — ABNORMAL HIGH (ref 1.7–7.7)
Neutrophils Relative %: 86 %
Platelets: 184 10*3/uL (ref 150–400)
RBC: 2.79 MIL/uL — ABNORMAL LOW (ref 3.87–5.11)
RDW: 27.5 % — ABNORMAL HIGH (ref 11.5–15.5)
WBC: 14.7 10*3/uL — ABNORMAL HIGH (ref 4.0–10.5)
nRBC: 0 % (ref 0.0–0.2)

## 2022-09-25 LAB — COMPREHENSIVE METABOLIC PANEL
ALT: 10 U/L (ref 0–44)
AST: 25 U/L (ref 15–41)
Albumin: 2.7 g/dL — ABNORMAL LOW (ref 3.5–5.0)
Alkaline Phosphatase: 56 U/L (ref 38–126)
Anion gap: 6 (ref 5–15)
BUN: 10 mg/dL (ref 8–23)
CO2: 21 mmol/L — ABNORMAL LOW (ref 22–32)
Calcium: 8 mg/dL — ABNORMAL LOW (ref 8.9–10.3)
Chloride: 110 mmol/L (ref 98–111)
Creatinine, Ser: 0.98 mg/dL (ref 0.44–1.00)
GFR, Estimated: >60 mL/min (ref >60)
Glucose, Bld: 105 mg/dL — ABNORMAL HIGH (ref 70–99)
Potassium: 3.7 mmol/L (ref 3.5–5.1)
Sodium: 137 mmol/L (ref 135–145)
Total Bilirubin: 0.4 mg/dL (ref 0.3–1.2)
Total Protein: 5.4 g/dL — ABNORMAL LOW (ref 6.5–8.1)

## 2022-09-25 LAB — LIPASE, BLOOD: Lipase: 29 U/L (ref 11–51)

## 2022-09-25 MED ORDER — SODIUM CHLORIDE 0.9 % IV BOLUS
500.0000 mL | Freq: Once | INTRAVENOUS | Status: AC
  Administered 2022-09-25: 500 mL via INTRAVENOUS

## 2022-09-25 MED ORDER — PANTOPRAZOLE SODIUM 40 MG IV SOLR
40.0000 mg | Freq: Once | INTRAVENOUS | Status: AC
  Administered 2022-09-25: 40 mg via INTRAVENOUS
  Filled 2022-09-25: qty 10

## 2022-09-25 MED ORDER — IOHEXOL 350 MG/ML SOLN
100.0000 mL | Freq: Once | INTRAVENOUS | Status: AC | PRN
  Administered 2022-09-25: 100 mL via INTRAVENOUS

## 2022-09-25 MED ORDER — ONDANSETRON HCL 4 MG/2ML IJ SOLN
4.0000 mg | Freq: Once | INTRAMUSCULAR | Status: AC
  Administered 2022-09-25: 4 mg via INTRAVENOUS
  Filled 2022-09-25: qty 2

## 2022-09-25 NOTE — H&P (Addendum)
Date: 09/26/2022         Patient Name:  Peggy House MRN: [id number]  DOB: February 01, 1961 Age / Sex: 62 y.o., female   PCP: Olegario Messier, MD         Medical Service: Internal Medicine Teaching Service         Attending Physician: Dr. Earl Lagos, MD    First Contact: Dr. Sherrilee Gilles Pager: [phone]  Second Contact: Dr. Ned Card Pager: [phone]       After Hours (After 5p/  First Contact Pager: [phone]  weekends / holidays): Second Contact Pager: [phone]   Chief Concern: Nausea, abdominal discomfort, diarrhea, near fainting episode  History of Present Illness: 62 year old presents to the emergency department with abdominal discomfort with nausea and diarrhea.  She reports several days of diarrhea, with 2-3 episodes today.  Yesterday she started to have some nausea.  She has not vomited.  She also has some abdominal discomfort localized to the left lower quadrant.  She has not seen any blood in her stool or passed any melena.  She has not had any fevers with this.  She does say that her granddaughter, who she lives with, was sick with a diarrheal illness recently.  Evening of admission she had an episode of near fainting when going from bathroom to bedroom.  She lay down for a while and the sensation passed without loss of consciousness.  Of late she is also had a lot of nosebleeds, averaging around 2 to 3/week.  They are severe and last for upwards of an hour at a time.  Review of Systems  Constitutional:  Negative for fever.  Respiratory:  Negative for shortness of breath.   Cardiovascular:  Negative for chest pain and palpitations.  Skin:  Negative for rash.  Endo/Heme/Allergies:  Does not bruise/bleed easily.  Psychiatric/Behavioral:  Positive for depression.    In the ED she had stable vital signs but was acutely anemic.  Given her history of acute GI bleeding a CT of the abdomen pelvis was performed which was reassuring against bleeding.  Allergies: Allergies   Allergen Reactions   Feraheme [Ferumoxytol] Other (See Comments)    Muscle tetany, encephalopathy, fatigue, nausea (reaction to injection)   Nsaids Other (See Comments)    Stomach bleeding episodes; Tylenol is OK   Tomato Hives   Iron (Ferrous Sulfate) [Ferrous Sulfate Er] Other (See Comments)    Shock   Wasp Venom Swelling   Past Medical History: Reviewed, history notable for hereditary hemorrhagic telangiectasia, type 2 diabetes well-controlled, major depressive disorder, tobacco use.  Medications: Current Outpatient Medications  Medication Instructions   Accu-Chek Softclix Lancets lancets Use to check blood sugar before breakfast and before dinner while on steroids   ALPRAZolam (XANAX) 0.5 MG tablet TAKE 1 TABLET BY MOUTH AT BEDTIME AS NEEDED FOR ANXIETY OR SLEEP   Aminocaproic Acid 1,000 mg, Oral, 2 times daily   amLODipine (NORVASC) 10 MG tablet TAKE 1 TABLET(10 MG) BY MOUTH DAILY   citalopram (CELEXA) 20 mg, Oral, Daily   glucose blood (ACCU-CHEK GUIDE) test strip Check blood sugar 2 times per day while on steroids before breakfast and dinner   lidocaine-prilocaine (EMLA) cream 1 application , Topical, As needed   lisinopril (ZESTRIL) 20 mg, Oral, Daily   metFORMIN (GLUCOPHAGE) 500 MG tablet TAKE 1 TABLET(500 MG) BY MOUTH TWICE DAILY WITH A MEAL   nicotine (NICODERM CQ - DOSED IN MG/24 HOURS) 14 mg, Transdermal, Every 24 hours   ondansetron (ZOFRAN) 4 mg,  Oral, Every 8 hours PRN   pantoprazole (PROTONIX) 40 mg, Oral, 2 times daily   potassium chloride (KLOR-CON M) 10 MEQ tablet 10 mEq, Oral, Daily   PROAIR HFA 108 (90 Base) MCG/ACT inhaler INHALE 1 TO 2 PUFFS INTO THE LUNGS EVERY 6 HOURS AS NEEDED FOR WHEEZING OR SHORTNESS OF BREATH   traZODone (DESYREL) 100 MG tablet TAKE 1 TABLET(100 MG) BY MOUTH AT BEDTIME   TYLENOL PM EXTRA STRENGTH 500-25 MG TABS tablet 2 tablets, Oral, At bedtime PRN   Reports intermittent adherence to prescribed medications due to  forgetfulness.  Surgical History: Reviewed, notable for several EGDs most recently with clipping and APC of bleeding gastric AVMs.  Colonoscopy in 2021 with angiectasias and several sessile polyps that were hyperplastic without adenomatous changes or carcinoma.  Family History:  Reviewed, notable for pretty extensive history of GI malignancies and HHT.  Social History:  Lives in [place] with her son and his partner, and their children.  Things are good at home.  However she did experience the loss of her mother and her husband within the last couple of years and she still grieves their losses heavily.  She is unable to do very much because of her chronic illness but enjoys spending time with her grandchildren.  Does not drink, current smoker of approximately 7 to 10 cigarettes/day.  Physical Exam: Blood pressure (!) 154/79, pulse 72, temperature 97.9 F (36.6 C), temperature source Oral, resp. rate 18, height 5\' 5"  (1.651 m), weight 102.1 kg, SpO2 100 %.  Overall well-appearing on exam No scleral icterus Oral mucous membranes are moist and pink, no sublingual jaundice Crusted blood at the nares, normal appearing turbinates Heart rate is normal, rhythm is regular, radial pulses are strong, lower extremities are warm and without edema, no appreciable JVD, systolic murmur present Breathing is regular and unlabored on room air, scant crackles in bilateral lung bases, no wheezing Skin is warm and dry Abdomen is soft and nontender Alert and oriented, pupils are equal and reactive, no facial asymmetry, moving all extremities, speech is normal Tearful and depressed  EKG:  Sinus rhythm  Labs: Sodium 141 Potassium 3.7 Chloride 109 Bicarb 21 BUN 10/0.98 Glucose 100  Alk phos 56 AST/ALT 25/10 Albumin 2.7 Lipase 29  WBC 14.7 Hemoglobin 7.1 MCV 87.7  Images and other studies: CTA without contrast extravasation into the GI lumen.  No evidence of bowel obstruction.  Assessment &  Plan:  Peggy House is a 62 y.o. with history of hereditary hemorrhagic telangiectasias and resultant anemia presents with abdominal discomfort, nausea, and diarrhea associated with orthostatic near syncope, admitted for acute on chronic anemia probably from epistaxis without signs of active bleeding at this time.  Principal Problem:   Acute on chronic blood loss anemia Active Problems:   Major depressive disorder, recurrent episode (HCC)   Type 2 diabetes mellitus with vascular disease (HCC)   HHT (hereditary hemorrhagic telangiectasia) (HCC)  Anemia, acute on chronic Lower suspicion for acute GI bleed at this time.  I wonder if her acute on chronic anemia is due to her frequent episodes of epistaxis which are pretty severe and prolonged.  Plan to trend hemoglobin but do not think there is an indication for an urgent GI consult currently or blood transfusion at this time.  Currently asymptomatic.  She is hemodynamically stable and overall well-appearing on exam.  Want her to remain n.p.o. overnight until I can confirm a stable hemoglobin trend in case she may need an endoscopy tomorrow. -  Transfuse for hemoglobin <7 - A.m. CBC - Will also repeat iron studies - Holding anticoagulation for VTE prophylaxis until can confirm stable hemoglobin trend  Abdominal pain Nausea Diarrhea No signs of GI blood loss of late.  Previous episodes of GI bleeding have been characterized by hematemesis and frank melena.  I wonder if she has a gastroenteritis rather than acutely worsened GI bleeding at this time.  No red flags concerning for systemic infection.  CT of the abdomen and pelvis was reassuring.  Grandchild in the home with similar symptoms supports this.  She looks dry on exam and has a mild NAGMA, suspect she is a bit volume down and that her episode of orthostatic near syncope at home is a result of this.  Will treat this supportively with IV fluids overnight. - Repeat IV fluid bolus -  Orthostatic vital signs - Ondansetron as needed for nausea  Hereditary hemorrhagic telangiectasia Last EGD a few months ago showed several AVMs that were actively bleeding.  There were clipped and/or treated with APC.  More recently she has been suffering from frequent episodes of epistaxis.  She follows closely with Dr. Mosetta Putt of hematology/oncology for iron infusions and bevacizumab.  Will notify Dr. Mosetta Putt of this person's admission to hospital.  History of type 2 diabetes, well-controlled Last A1c 5.  Holding home metformin.  Major depressive disorder Sleep disturbance Pretty significant mood disorder.  Feels extreme grief over losses of her mother and husband.  Continue home meds for this while admitted. - Citalopram 20 mg daily - Alprazolam 0.5 mg nightly as needed for anxiety and sleep  Current smoker 7 to 10 cigarettes a day.  Has tried to quit several times.  Nicotine patches for duration of admission.  Diet: N.p.o. for now IVF: LR IV fluid bolus VTE: SCDs Start: 09/26/22 0049 Code: Full Surrogate: Sons, contact information listed in chart  Admit patient to Observation with expected length of stay less than 2 midnights.  Signed: Marrianne Mood MD 09/26/2022, 1:45 AM

## 2022-09-25 NOTE — ED Provider Notes (Signed)
[place] EMERGENCY DEPARTMENT AT [facility] Provider Note   CSN: [id number] Arrival date & time: 09/25/22  2043     History  Chief Complaint  Patient presents with   Abdominal Pain   Nausea    Peggy House is a 62 y.o. female.  Patient is a 62 year old female who presents with abdominal pain.  It started yesterday.  She has had some associated nausea.  She has had some diarrhea for about a week.  She does not report any vomiting.  No fevers.  No urinary symptoms.  She has been feeling dizzy and lightheaded and generally weak.  She denies any chest pain or shortness of breath.  She does have a history of hereditary hemorrhagic telangiectasia with GI bleeds in the past.  She does not report any noticeable blood in her stool or black stools.  No fevers.       Home Medications Prior to Admission medications   Medication Sig Start Date End Date Taking? Authorizing Provider  potassium chloride (KLOR-CON M) 10 MEQ tablet Take 1 tablet (10 mEq total) by mouth daily. 07/29/22   Malachy Mood, MD  Accu-Chek Softclix Lancets lancets Use to check blood sugar before breakfast and before dinner while on steroids Patient taking differently: 1 each by Other route See admin instructions. Use to check blood sugar before breakfast and before dinner while on steroids 09/27/19   Claudean Severance, MD  ALPRAZolam Prudy Feeler) 0.5 MG tablet TAKE 1 TABLET BY MOUTH AT BEDTIME AS NEEDED FOR ANXIETY OR SLEEP 09/11/22   Malachy Mood, MD  Aminocaproic Acid 1000 MG TABS Take 1 tablet (1,000 mg total) by mouth in the morning and at bedtime. 07/23/22   Malachy Mood, MD  amLODipine (NORVASC) 10 MG tablet TAKE 1 TABLET(10 MG) BY MOUTH DAILY 06/12/22   Pollyann Samples, NP  citalopram (CELEXA) 20 MG tablet Take 1 tablet (20 mg total) by mouth daily. 06/20/22 06/20/23  Nooruddin, Jason Fila, MD  glucose blood (ACCU-CHEK GUIDE) test strip Check blood sugar 2 times per day while on steroids before breakfast and dinner Patient  taking differently: 1 each by Other route See admin instructions. Check blood sugar 2 times per day while on steroids before breakfast and dinner 09/27/19   Claudean Severance, MD  lidocaine-prilocaine (EMLA) cream Apply 1 application topically as needed. Patient taking differently: Apply 1 application  topically as needed (for port access). 07/17/18   Malachy Mood, MD  lisinopril (ZESTRIL) 20 MG tablet Take 1 tablet (20 mg total) by mouth daily. 06/20/22 06/20/23  Nooruddin, Jason Fila, MD  metFORMIN (GLUCOPHAGE) 500 MG tablet TAKE 1 TABLET(500 MG) BY MOUTH TWICE DAILY WITH A MEAL Patient taking differently: Take 500 mg by mouth 2 (two) times daily with a meal. 11/21/21   Champ Mungo, DO  nicotine (NICODERM CQ - DOSED IN MG/24 HOURS) 14 mg/24hr patch Place 1 patch (14 mg total) onto the skin daily. 06/20/22 06/20/23  Nooruddin, Jason Fila, MD  ondansetron (ZOFRAN) 4 MG tablet Take 1 tablet (4 mg total) by mouth every 8 (eight) hours as needed for nausea or vomiting. 04/02/22   Malachy Mood, MD  pantoprazole (PROTONIX) 40 MG tablet Take 1 tablet (40 mg total) by mouth 2 (two) times daily. 09/19/22   Malachy Mood, MD  Podiatric Products (FLEXITOL HEEL BALM) OINT Apply 1 application  topically as needed (to the feet- for dry skin). 06/10/19   [provider]  PROAIR HFA 108 (90 Base) MCG/ACT inhaler INHALE 1 TO 2  PUFFS INTO THE LUNGS EVERY 6 HOURS AS NEEDED FOR WHEEZING OR SHORTNESS OF BREATH Patient taking differently: Inhale 1-2 puffs into the lungs every 6 (six) hours as needed for wheezing or shortness of breath. 11/10/20   Malachy Mood, MD  terbinafine (LAMISIL AT) 1 % cream Apply 1 application topically 2 (two) times daily. Both bottom and top of both feet and toes Patient taking differently: Apply 1 application  topically 2 (two) times daily as needed (for affected areas of both feet and toes- bottoms and tops). 06/16/20   Seawell, Jaimie A, DO  traZODone (DESYREL) 100 MG tablet TAKE 1 TABLET(100 MG) BY MOUTH AT BEDTIME 07/23/22    Malachy Mood, MD  TYLENOL PM EXTRA STRENGTH 500-25 MG TABS tablet Take 2 tablets by mouth at bedtime as needed (for sleep).    [provider]      Allergies    Feraheme [ferumoxytol], Nsaids, Tomato, Iron (ferrous sulfate) [ferrous sulfate er], and Wasp venom    Review of Systems   Review of Systems  Constitutional:  Positive for fatigue. Negative for chills, diaphoresis and fever.  HENT:  Negative for congestion, rhinorrhea and sneezing.   Eyes: Negative.   Respiratory:  Negative for cough, chest tightness and shortness of breath.   Cardiovascular:  Negative for chest pain and leg swelling.  Gastrointestinal:  Positive for abdominal pain, diarrhea and nausea. Negative for blood in stool and vomiting.  Genitourinary:  Negative for difficulty urinating, flank pain, frequency and hematuria.  Musculoskeletal:  Negative for arthralgias and back pain.  Skin:  Negative for rash.  Neurological:  Positive for light-headedness. Negative for speech difficulty, weakness, numbness and headaches.    Physical Exam Updated Vital Signs BP (!) 156/79   Pulse (!) 55   Temp 98 F (36.7 C) (Oral)   Resp 16   Ht 5\' 5"  (1.651 m)   Wt 102.1 kg   SpO2 100%   BMI 37.44 kg/m  Physical Exam Constitutional:      Appearance: She is well-developed.  HENT:     Head: Normocephalic and atraumatic.  Eyes:     Pupils: Pupils are equal, round, and reactive to light.  Cardiovascular:     Rate and Rhythm: Normal rate and regular rhythm.     Heart sounds: Normal heart sounds.  Pulmonary:     Effort: Pulmonary effort is normal. No respiratory distress.     Breath sounds: Normal breath sounds. No wheezing or rales.  Chest:     Chest wall: No tenderness.  Abdominal:     General: Bowel sounds are normal.     Palpations: Abdomen is soft.     Tenderness: There is generalized abdominal tenderness. There is no guarding or rebound.  Musculoskeletal:        General: Normal range of motion.     Cervical  back: Normal range of motion and neck supple.  Lymphadenopathy:     Cervical: No cervical adenopathy.  Skin:    General: Skin is warm and dry.     Findings: No rash.  Neurological:     Mental Status: She is alert and oriented to person, place, and time.     ED Results / Procedures / Treatments   Labs (all labs ordered are listed, but only abnormal results are displayed) Labs Reviewed  CBC WITH DIFFERENTIAL/PLATELET - Abnormal; Notable for the following components:      Result Value   WBC 14.7 (*)    RBC 2.79 (*)    Hemoglobin  7.1 (*)    HCT 24.3 (*)    MCH 25.4 (*)    MCHC 29.2 (*)    RDW 27.5 (*)    Neutro Abs 12.9 (*)    All other components within normal limits  COMPREHENSIVE METABOLIC PANEL - Abnormal; Notable for the following components:   CO2 21 (*)    Glucose, Bld 105 (*)    Calcium 8.0 (*)    Total Protein 5.4 (*)    Albumin 2.7 (*)    All other components within normal limits  I-STAT CHEM 8, ED - Abnormal; Notable for the following components:   Glucose, Bld 100 (*)    Hemoglobin 8.2 (*)    HCT 24.0 (*)    All other components within normal limits  LIPASE, BLOOD  URINALYSIS, W/ REFLEX TO CULTURE (INFECTION SUSPECTED)  TYPE AND SCREEN    EKG None  Radiology CT ANGIO GI BLEED  Result Date: 09/25/2022 CLINICAL DATA:  Abdominal pain, acute, nonlocalized.  Nausea EXAM: CTA ABDOMEN AND PELVIS WITHOUT AND WITH CONTRAST TECHNIQUE: Multidetector CT imaging of the abdomen and pelvis was performed using the standard protocol during bolus administration of intravenous contrast. Multiplanar reconstructed images and MIPs were obtained and reviewed to evaluate the vascular anatomy. RADIATION DOSE REDUCTION: This exam was performed according to the departmental dose-optimization program which includes automated exposure control, adjustment of the mA and/or kV according to patient size and/or use of iterative reconstruction technique. CONTRAST:  OMNIPAQUE IOHEXOL 350  MG/ML SOLN COMPARISON:  None Available. FINDINGS: VASCULAR Aorta: Scattered infrarenal aortic atherosclerosis. No aneurysm or dissection. No significant stenosis. Celiac: Patent without evidence of aneurysm, dissection, vasculitis or significant stenosis. SMA: Patent without evidence of aneurysm, dissection, vasculitis or significant stenosis. Renals: Both renal arteries are patent without evidence of aneurysm, dissection, vasculitis, fibromuscular dysplasia or significant stenosis. IMA: Patent without evidence of aneurysm, dissection, vasculitis or significant stenosis. Inflow: Moderate atherosclerotic calcifications. No aneurysm, dissection, or significant stenosis. Proximal Outflow: Scattered calcifications. No aneurysm, dissection or significant stenosis. Veins: No obvious venous abnormality within the limitations of this arterial phase study. Review of the MIP images confirms the above findings. NON-VASCULAR Lower chest: No acute abnormality. Linear scarring or chronic atelectasis in the lung bases, stable. Hepatobiliary: Small calcification within the gallbladder, stable. Gallbladder is nondistended. No biliary ductal dilatation. No focal hepatic abnormality. Pancreas: No focal abnormality or ductal dilatation. Spleen: No focal abnormality.  Normal size. Adrenals/Urinary Tract: Small cyst in the upper pole of the right kidney, stable. No suspicious renal or adrenal abnormality. No follow-up imaging recommended. No hydronephrosis. Urinary bladder unremarkable. Stomach/Bowel: Surgical clips within and around the stomach. Stomach is moderately distended. No active extravasation of contrast within the bowel to localize GI bleed. Small and large bowel decompressed, unremarkable. No bowel obstruction. Lymphatic: No adenopathy Reproductive: Prior hysterectomy.  No adnexal masses. Other: No free fluid or free air. Musculoskeletal: No acute bony abnormality. IMPRESSION: VASCULAR Aortoiliac atherosclerosis.  No aneurysm  or dissection. No active contrast extravasation to localize GI bleed. NON-VASCULAR Moderate distention of the stomach with debris. Cholelithiasis.  No CT evidence of acute cholecystitis. No acute findings. Electronically Signed   By: Charlett Nose M.D.   On: 09/25/2022 23:25    Procedures Procedures    Medications Ordered in ED Medications  sodium chloride 0.9 % bolus 500 mL (500 mLs Intravenous Bolus 09/25/22 2244)  ondansetron (ZOFRAN) injection 4 mg (4 mg Intravenous Given 09/25/22 2245)  pantoprazole (PROTONIX) injection 40 mg (40 mg Intravenous Given 09/25/22  2245)  iohexol (OMNIPAQUE) 350 MG/ML injection 100 mL (100 mLs Intravenous Contrast Given 09/25/22 2313)    ED Course/ Medical Decision Making/ A&P                             Medical Decision Making Amount and/or Complexity of Data Reviewed Labs: ordered. Radiology: ordered.  Risk Prescription drug management. Decision regarding hospitalization.   Patient is a 62 year old female who presents with abdominal pain associated with some nausea and diarrhea.  She does not report any obvious blood in her stools or black stools.  She does have a history of HHT and AVMs in her abdomen with GI bleeds in the past.  Her hemoglobin today is lower than her baseline.  It is 7.1.  CT scan was performed which shows no acute evidence of bleeding.  No other acute abnormalities.  She has some gallstones but no evidence of cholecystitis.  She does not have localized pain over her gallbladder.  She was given IV Protonix and Zofran.  Will consult the internal medicine teaching service for admission.  Final Clinical Impression(s) / ED Diagnoses Final diagnoses:  Generalized abdominal pain  Iron deficiency anemia, unspecified iron deficiency anemia type    Rx / DC Orders ED Discharge Orders     None         Rolan Bucco, MD 09/25/22 2353

## 2022-09-25 NOTE — ED Notes (Signed)
Pt to CT

## 2022-09-25 NOTE — ED Triage Notes (Signed)
Pt c/o nausea, abdo pain and dizyness x 1 week. Previously 1 week of vomiting and diarrhea. Last episode 1 week ago.

## 2022-09-26 DIAGNOSIS — E785 Hyperlipidemia, unspecified: Secondary | ICD-10-CM | POA: Diagnosis not present

## 2022-09-26 DIAGNOSIS — G479 Sleep disorder, unspecified: Secondary | ICD-10-CM | POA: Diagnosis not present

## 2022-09-26 DIAGNOSIS — F329 Major depressive disorder, single episode, unspecified: Secondary | ICD-10-CM | POA: Diagnosis not present

## 2022-09-26 DIAGNOSIS — E119 Type 2 diabetes mellitus without complications: Secondary | ICD-10-CM

## 2022-09-26 DIAGNOSIS — D500 Iron deficiency anemia secondary to blood loss (chronic): Secondary | ICD-10-CM | POA: Diagnosis not present

## 2022-09-26 DIAGNOSIS — F17210 Nicotine dependence, cigarettes, uncomplicated: Secondary | ICD-10-CM | POA: Diagnosis not present

## 2022-09-26 DIAGNOSIS — Z79899 Other long term (current) drug therapy: Secondary | ICD-10-CM | POA: Diagnosis not present

## 2022-09-26 DIAGNOSIS — E872 Acidosis, unspecified: Secondary | ICD-10-CM | POA: Diagnosis not present

## 2022-09-26 DIAGNOSIS — K254 Chronic or unspecified gastric ulcer with hemorrhage: Secondary | ICD-10-CM | POA: Diagnosis not present

## 2022-09-26 DIAGNOSIS — F339 Major depressive disorder, recurrent, unspecified: Secondary | ICD-10-CM | POA: Diagnosis not present

## 2022-09-26 DIAGNOSIS — K921 Melena: Secondary | ICD-10-CM | POA: Diagnosis not present

## 2022-09-26 DIAGNOSIS — I780 Hereditary hemorrhagic telangiectasia: Secondary | ICD-10-CM | POA: Diagnosis not present

## 2022-09-26 DIAGNOSIS — D649 Anemia, unspecified: Secondary | ICD-10-CM | POA: Diagnosis not present

## 2022-09-26 DIAGNOSIS — I700 Atherosclerosis of aorta: Secondary | ICD-10-CM | POA: Diagnosis not present

## 2022-09-26 DIAGNOSIS — M1990 Unspecified osteoarthritis, unspecified site: Secondary | ICD-10-CM | POA: Diagnosis not present

## 2022-09-26 DIAGNOSIS — W449XXA Unspecified foreign body entering into or through a natural orifice, initial encounter: Secondary | ICD-10-CM | POA: Diagnosis present

## 2022-09-26 DIAGNOSIS — K31811 Angiodysplasia of stomach and duodenum with bleeding: Secondary | ICD-10-CM | POA: Diagnosis not present

## 2022-09-26 DIAGNOSIS — E669 Obesity, unspecified: Secondary | ICD-10-CM | POA: Diagnosis not present

## 2022-09-26 DIAGNOSIS — D62 Acute posthemorrhagic anemia: Secondary | ICD-10-CM

## 2022-09-26 DIAGNOSIS — K8020 Calculus of gallbladder without cholecystitis without obstruction: Secondary | ICD-10-CM | POA: Diagnosis not present

## 2022-09-26 DIAGNOSIS — E869 Volume depletion, unspecified: Secondary | ICD-10-CM | POA: Diagnosis not present

## 2022-09-26 DIAGNOSIS — R109 Unspecified abdominal pain: Secondary | ICD-10-CM | POA: Diagnosis not present

## 2022-09-26 DIAGNOSIS — R040 Epistaxis: Secondary | ICD-10-CM | POA: Diagnosis not present

## 2022-09-26 DIAGNOSIS — F419 Anxiety disorder, unspecified: Secondary | ICD-10-CM | POA: Diagnosis not present

## 2022-09-26 DIAGNOSIS — K449 Diaphragmatic hernia without obstruction or gangrene: Secondary | ICD-10-CM | POA: Diagnosis not present

## 2022-09-26 DIAGNOSIS — R1084 Generalized abdominal pain: Secondary | ICD-10-CM | POA: Diagnosis not present

## 2022-09-26 DIAGNOSIS — D509 Iron deficiency anemia, unspecified: Secondary | ICD-10-CM | POA: Diagnosis not present

## 2022-09-26 DIAGNOSIS — K219 Gastro-esophageal reflux disease without esophagitis: Secondary | ICD-10-CM | POA: Diagnosis not present

## 2022-09-26 DIAGNOSIS — Z7984 Long term (current) use of oral hypoglycemic drugs: Secondary | ICD-10-CM | POA: Diagnosis not present

## 2022-09-26 DIAGNOSIS — E1151 Type 2 diabetes mellitus with diabetic peripheral angiopathy without gangrene: Secondary | ICD-10-CM | POA: Diagnosis not present

## 2022-09-26 DIAGNOSIS — R197 Diarrhea, unspecified: Secondary | ICD-10-CM | POA: Diagnosis not present

## 2022-09-26 DIAGNOSIS — T182XXA Foreign body in stomach, initial encounter: Secondary | ICD-10-CM | POA: Diagnosis not present

## 2022-09-26 DIAGNOSIS — I10 Essential (primary) hypertension: Secondary | ICD-10-CM | POA: Diagnosis not present

## 2022-09-26 DIAGNOSIS — R0602 Shortness of breath: Secondary | ICD-10-CM | POA: Diagnosis not present

## 2022-09-26 LAB — URINALYSIS, W/ REFLEX TO CULTURE (INFECTION SUSPECTED)
Bilirubin Urine: NEGATIVE
Glucose, UA: NEGATIVE mg/dL
Hgb urine dipstick: NEGATIVE
Ketones, ur: NEGATIVE mg/dL
Nitrite: POSITIVE — AB
Protein, ur: 100 mg/dL — AB
Specific Gravity, Urine: 1.042 — ABNORMAL HIGH (ref 1.005–1.030)
pH: 6 (ref 5.0–8.0)

## 2022-09-26 LAB — IRON AND TIBC
Iron: 69 ug/dL (ref 28–170)
Saturation Ratios: 29 % (ref 10.4–31.8)
TIBC: 241 ug/dL — ABNORMAL LOW (ref 250–450)
UIBC: 172 ug/dL

## 2022-09-26 LAB — TYPE AND SCREEN

## 2022-09-26 LAB — CBC
HCT: 22.3 % — ABNORMAL LOW (ref 36.0–46.0)
Hemoglobin: 6.5 g/dL — CL (ref 12.0–15.0)
MCH: 25.2 pg — ABNORMAL LOW (ref 26.0–34.0)
MCHC: 29.1 g/dL — ABNORMAL LOW (ref 30.0–36.0)
MCV: 86.4 fL (ref 80.0–100.0)
Platelets: 164 10*3/uL (ref 150–400)
RBC: 2.58 MIL/uL — ABNORMAL LOW (ref 3.87–5.11)
RDW: 27.6 % — ABNORMAL HIGH (ref 11.5–15.5)
WBC: 10.6 10*3/uL — ABNORMAL HIGH (ref 4.0–10.5)
nRBC: 0 % (ref 0.0–0.2)

## 2022-09-26 LAB — BPAM RBC
ISSUE DATE / TIME: 202406131216
Unit Type and Rh: 600

## 2022-09-26 LAB — BASIC METABOLIC PANEL
Anion gap: 6 (ref 5–15)
BUN: 15 mg/dL (ref 8–23)
CO2: 24 mmol/L (ref 22–32)
Calcium: 8.3 mg/dL — ABNORMAL LOW (ref 8.9–10.3)
Chloride: 111 mmol/L (ref 98–111)
Creatinine, Ser: 0.79 mg/dL (ref 0.44–1.00)
GFR, Estimated: >60 mL/min (ref >60)
Glucose, Bld: 96 mg/dL (ref 70–99)
Potassium: 3.8 mmol/L (ref 3.5–5.1)
Sodium: 141 mmol/L (ref 135–145)

## 2022-09-26 LAB — FERRITIN: Ferritin: 48 ng/mL (ref 11–307)

## 2022-09-26 LAB — HEMOGLOBIN AND HEMATOCRIT, BLOOD
HCT: 26.3 % — ABNORMAL LOW (ref 36.0–46.0)
Hemoglobin: 8.3 g/dL — ABNORMAL LOW (ref 12.0–15.0)

## 2022-09-26 LAB — PREPARE RBC (CROSSMATCH)

## 2022-09-26 MED ORDER — CITALOPRAM HYDROBROMIDE 20 MG PO TABS
20.0000 mg | ORAL_TABLET | Freq: Every day | ORAL | Status: DC
  Administered 2022-09-26 – 2022-09-28 (×3): 20 mg via ORAL
  Filled 2022-09-26 (×3): qty 1

## 2022-09-26 MED ORDER — ONDANSETRON HCL 4 MG/2ML IJ SOLN
4.0000 mg | Freq: Four times a day (QID) | INTRAMUSCULAR | Status: DC | PRN
  Administered 2022-09-26: 4 mg via INTRAVENOUS
  Filled 2022-09-26: qty 2

## 2022-09-26 MED ORDER — PANTOPRAZOLE SODIUM 40 MG PO TBEC
40.0000 mg | DELAYED_RELEASE_TABLET | Freq: Two times a day (BID) | ORAL | Status: DC
  Administered 2022-09-26 – 2022-09-28 (×4): 40 mg via ORAL
  Filled 2022-09-26 (×4): qty 1

## 2022-09-26 MED ORDER — TRAZODONE HCL 100 MG PO TABS
100.0000 mg | ORAL_TABLET | Freq: Every day | ORAL | Status: DC
  Administered 2022-09-26 – 2022-09-27 (×3): 100 mg via ORAL
  Filled 2022-09-26 (×3): qty 1

## 2022-09-26 MED ORDER — ACETAMINOPHEN 500 MG PO TABS
1000.0000 mg | ORAL_TABLET | Freq: Four times a day (QID) | ORAL | Status: DC | PRN
  Administered 2022-09-26 – 2022-09-28 (×3): 1000 mg via ORAL
  Filled 2022-09-26 (×3): qty 2

## 2022-09-26 MED ORDER — ACETAMINOPHEN 650 MG RE SUPP: 650.0000 mg | Freq: Four times a day (QID) | RECTAL | Status: DC | PRN

## 2022-09-26 MED ORDER — SODIUM CHLORIDE 0.9% IV SOLUTION: Freq: Once | INTRAVENOUS | Status: AC

## 2022-09-26 MED ORDER — ONDANSETRON HCL 4 MG PO TABS: 4.0000 mg | ORAL_TABLET | Freq: Four times a day (QID) | ORAL | Status: DC | PRN

## 2022-09-26 MED ORDER — NICOTINE 14 MG/24HR TD PT24
14.0000 mg | MEDICATED_PATCH | Freq: Every day | TRANSDERMAL | Status: DC
  Administered 2022-09-26 – 2022-09-28 (×3): 14 mg via TRANSDERMAL
  Filled 2022-09-26 (×3): qty 1

## 2022-09-26 MED ORDER — LACTATED RINGERS IV BOLUS
1000.0000 mL | Freq: Once | INTRAVENOUS | Status: AC
  Administered 2022-09-26: 1000 mL via INTRAVENOUS

## 2022-09-26 MED ORDER — ALPRAZOLAM 0.5 MG PO TABS
0.5000 mg | ORAL_TABLET | Freq: Every evening | ORAL | Status: DC | PRN
  Administered 2022-09-26 – 2022-09-27 (×3): 0.5 mg via ORAL
  Filled 2022-09-26 (×3): qty 1

## 2022-09-26 NOTE — Progress Notes (Addendum)
HD#0 Subjective:   Summary: Peggy House is a 62 y.o. female with PMH of HHT, T2DM, MDD, tobacco use who presents with abdominal discomfort, nausea, diarrhea with orthostatic near syncope and admitted for acute on chronic anemia.  Overnight Events: AM Hgb dropped to 6.5, order for 1 unit PRBC placed  Patient was sleeping but easily roused. She notes that she has been having abdominal discomfort and diarrhea. Notes some loose bowel movements yesterday but none today. Denies any blood in her stools. She states that she has been having nose bleeds. Does not use humidifier and only applies Vaseline. Patient denies any nose bleeds today or yesterday. Not feeling lightheaded or dizzy.  She does follow with ENT and follows with Dr. Mosetta Putt with hematology. She notes that she has been having left knee pain which is chronic and unchanged. Discussed the plan with patient and answered all questions.   Objective:  Vital signs in last 24 hours: Vitals:   09/25/22 2057 09/25/22 2329 09/26/22 0107 09/26/22 0249  BP:  (!) 156/79 (!) 154/79 139/76  Pulse:  (!) 55 72 74  Resp:  16 18 18   Temp:  98 F (36.7 C) 97.9 F (36.6 C) 98.5 F (36.9 C)  TempSrc:  Oral Oral Oral  SpO2:  100% 100% 100%  Weight: 102.1 kg     Height: 5\' 5"  (1.651 m)      Supplemental O2: Room Air SpO2: 100 %   Physical Exam:  Constitutional: sleeping but easily roused, lying in bed comfortably, in no acute distress HENT: normocephalic atraumatic, no dried blood in nares Cardiovascular: regular rate and rhythm, systolic murmur heard best at RUSB, no LE edema Pulmonary/Chest: normal work of breathing on room air, anterior breath sounds clear Abdominal: bowel sounds present, soft, non-tender, non-distended MSK: normal bulk and tone Neurological: alert & oriented x 3 Skin: warm and dry Psych: pleasant mood   Filed Weights   09/25/22 2057  Weight: 102.1 kg     Intake/Output Summary (Last 24 hours) at 09/26/2022  0629 Last data filed at 09/26/2022 0500 Gross per 24 hour  Intake 1630 ml  Output --  Net 1630 ml   Net IO Since Admission: 1,630 mL [09/26/22 0629]  Pertinent Labs:    Latest Ref Rng & Units 09/25/2022   10:03 PM 09/25/2022    9:34 PM 09/19/2022    9:49 AM  CBC  WBC 4.0 - 10.5 K/uL  14.7  8.9   Hemoglobin 12.0 - 15.0 g/dL 8.2  7.1  8.4   Hematocrit 36.0 - 46.0 % 24.0  24.3  27.8   Platelets 150 - 400 K/uL  184  234        Latest Ref Rng & Units 09/25/2022   10:03 PM 09/25/2022    9:34 PM 08/22/2022    9:17 AM  CMP  Glucose 70 - 99 mg/dL 409  811  914   BUN 8 - 23 mg/dL 12  10  12    Creatinine 0.44 - 1.00 mg/dL 7.82  9.56  2.13   Sodium 135 - 145 mmol/L 141  137  142   Potassium 3.5 - 5.1 mmol/L 3.7  3.7  3.4   Chloride 98 - 111 mmol/L 109  110  107   CO2 22 - 32 mmol/L  21  28   Calcium 8.9 - 10.3 mg/dL  8.0  8.7   Total Protein 6.5 - 8.1 g/dL  5.4  7.1   Total Bilirubin 0.3 - 1.2  mg/dL  0.4  0.3   Alkaline Phos 38 - 126 U/L  56  85   AST 15 - 41 U/L  25  11   ALT 0 - 44 U/L  10  5     Imaging: CT ANGIO GI BLEED  Result Date: 09/25/2022 CLINICAL DATA:  Abdominal pain, acute, nonlocalized.  Nausea EXAM: CTA ABDOMEN AND PELVIS WITHOUT AND WITH CONTRAST TECHNIQUE: Multidetector CT imaging of the abdomen and pelvis was performed using the standard protocol during bolus administration of intravenous contrast. Multiplanar reconstructed images and MIPs were obtained and reviewed to evaluate the vascular anatomy. RADIATION DOSE REDUCTION: This exam was performed according to the departmental dose-optimization program which includes automated exposure control, adjustment of the mA and/or kV according to patient size and/or use of iterative reconstruction technique. CONTRAST:  OMNIPAQUE IOHEXOL 350 MG/ML SOLN COMPARISON:  None Available. FINDINGS: VASCULAR Aorta: Scattered infrarenal aortic atherosclerosis. No aneurysm or dissection. No significant stenosis. Celiac: Patent without  evidence of aneurysm, dissection, vasculitis or significant stenosis. SMA: Patent without evidence of aneurysm, dissection, vasculitis or significant stenosis. Renals: Both renal arteries are patent without evidence of aneurysm, dissection, vasculitis, fibromuscular dysplasia or significant stenosis. IMA: Patent without evidence of aneurysm, dissection, vasculitis or significant stenosis. Inflow: Moderate atherosclerotic calcifications. No aneurysm, dissection, or significant stenosis. Proximal Outflow: Scattered calcifications. No aneurysm, dissection or significant stenosis. Veins: No obvious venous abnormality within the limitations of this arterial phase study. Review of the MIP images confirms the above findings. NON-VASCULAR Lower chest: No acute abnormality. Linear scarring or chronic atelectasis in the lung bases, stable. Hepatobiliary: Small calcification within the gallbladder, stable. Gallbladder is nondistended. No biliary ductal dilatation. No focal hepatic abnormality. Pancreas: No focal abnormality or ductal dilatation. Spleen: No focal abnormality.  Normal size. Adrenals/Urinary Tract: Small cyst in the upper pole of the right kidney, stable. No suspicious renal or adrenal abnormality. No follow-up imaging recommended. No hydronephrosis. Urinary bladder unremarkable. Stomach/Bowel: Surgical clips within and around the stomach. Stomach is moderately distended. No active extravasation of contrast within the bowel to localize GI bleed. Small and large bowel decompressed, unremarkable. No bowel obstruction. Lymphatic: No adenopathy Reproductive: Prior hysterectomy.  No adnexal masses. Other: No free fluid or free air. Musculoskeletal: No acute bony abnormality. IMPRESSION: VASCULAR Aortoiliac atherosclerosis.  No aneurysm or dissection. No active contrast extravasation to localize GI bleed. NON-VASCULAR Moderate distention of the stomach with debris. Cholelithiasis.  No CT evidence of acute  cholecystitis. No acute findings. Electronically Signed   By: Charlett Nose M.D.   On: 09/25/2022 23:25    Assessment/Plan:   Principal Problem:   Acute on chronic blood loss anemia Active Problems:   Major depressive disorder, recurrent episode (HCC)   Type 2 diabetes mellitus with vascular disease (HCC)   HHT (hereditary hemorrhagic telangiectasia) (HCC)   Patient Summary: Peggy House is a 62 y.o. with a pertinent PMH of HHT, T2DM, MDD, tobacco use who presents with abdominal discomfort, nausea, diarrhea with orthostatic near syncope and admitted for acute on chronic anemia likely from recurrent epistaxis.  #Acute on chronic normocytic anemia #Hereditary hemorrhagic telangiectasia Presents with increased frequency of epistaxis averaging 2-3 per week that is prolonged. Denies any epistaxis in past 2 days. Patient denies any blood in her stools even with her recent diarrhea. She has had prior GI bleeding so she is aware of those changes.  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 Low suspicion for acute GI bleed at this time. Hgb  dropped to 6.5 from 7.1.  Iron studies showed iron 69, TIBC 241, saturation 29% and ferritin of 48. BUN WNL. She endorsed some lightheadedness prior to admission.  Pending orthostatic vitals.  She follows with Dr. Mosetta Putt and last received bevacizumab on 6/6 along with iron infusions.  -Transfuse 1 unit PRBC today -Follow-up on post H&H -Trend CBC with transfusion protocol if Hgb <7 -Orthostatic vitals  #Abdominal pain with nausea and diarrhea Recent history of nonbloody diarrhea with loose stools yesterday but none this morning.  Notes grandchild at home had similar symptoms.  Had abdominal pain but that has since improved. CT abdomen and pelvis without acute findings or bleeding.  Received IV fluids on arrival.  Exam today showed present bowel sounds and soft, nontender and nondistended abdomen. -Continue to monitor  #T2DM, well-controlled Last A1c 5%  on metformin.  Continue CBG monitoring.  #MDD #Sleep disturbance On admission, she was noted to be tearful over her loss of her mother and husband. This morning she was more calm and in pleasant mood.  -Continue home citalopram 20 mg and Xanax 0.5 mg nightly PRN  #Tobacco use States 7-10 cigarettes a day. Has tried to quit but unsuccessful.  -Nicotine patch    Diet: Normal IVF: None,None VTE: SCDs Code: Full PT/OT recs: Pending Family Update: no family at bedside this morning  Dispo: Anticipated discharge to Home in 1-2 days pending blood transfusion and stable Hgb.   Rana Snare, DO Internal Medicine Resident PGY-1 Pager: [phone] Please contact the on call pager after 5 pm and on weekends at [phone].

## 2022-09-26 NOTE — Hospital Course (Addendum)
Peggy House is a 62 y.o. with a pertinent PMH of HHT, T2DM, MDD, tobacco use who presents with abdominal discomfort, nausea, diarrhea with orthostatic near syncope and admitted for acute on chronic anemia likely from epistaxis and bleeding AVM.   #Acute on chronic normocytic anemia #AVMs Presents with increased frequency of epistaxis averaging 2-3 per week that is prolonged and melena. 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  Hgb dropped to 6.5 from 7.1.  Iron studies showed iron 69, TIBC 241, saturation 29% and ferritin of 48. BUN WNL. She endorsed some lightheadedness prior to admission.  The patient required 1u PRBC during admission and also had an EGD performed with showed a nonbleeding gastric ulcer with possible AVM involvement s/p hemoclips. She did not have any further bleeding and Hb and vitals were stable on the day of discharge.   #Hereditary hemorrhagic telangiectasia She follows with Dr. Mosetta Putt and last received bevacizumab on 6/6 along with iron infusions. Patient to follow up with Dr. Mosetta Putt one week after discharge.   #Abdominal pain with nausea and diarrhea Recent history of nonbloody diarrhea with loose stools yesterday but none this morning.  Notes grandchild at home had similar symptoms.  Had abdominal pain but that has since improved. CT abdomen and pelvis without acute findings or bleeding.  Received IV fluids on arrival and symptoms improved with supportive care.   #HTN On lisinopril and amlodipine at home. Resumed home meds.    #T2DM, well-controlled Last A1c 5% on metformin.  Continue CBG monitoring.   #MDD #Sleep disturbance Continued home citalopram 20 mg, trazodone, and Xanax 0.5 mg nightly PRN. Will need medications addressed as outpatient, due to risk of serotonin syndrome,    #Tobacco use States 7-10 cigarettes a day. Has tried to quit but unsuccessful. Provided nicotine patch while in the hospital.

## 2022-09-26 NOTE — Plan of Care (Signed)

## 2022-09-26 NOTE — ED Notes (Signed)
ED TO INPATIENT HANDOFF REPORT  ED Nurse Name and Phone #:  Johnna Acosta [phone]  S Name/Age/Gender Peggy House 62 y.o. female Room/Bed: 038C/038C  Code Status   Code Status: Full Code  Home/SNF/Other Home Patient oriented to: self, place, time, and situation Is this baseline? Yes   Triage Complete: Triage complete  Chief Complaint Symptomatic anemia [D64.9]  Triage Note Pt c/o nausea, abdo pain and dizyness x 1 week. Previously 1 week of vomiting and diarrhea. Last episode 1 week ago.    Allergies Allergies  Allergen Reactions   Feraheme [Ferumoxytol] Other (See Comments)    Muscle tetany, encephalopathy, fatigue, nausea (reaction to injection)   Nsaids Other (See Comments)    Stomach bleeding episodes; Tylenol is OK   Tomato Hives   Iron (Ferrous Sulfate) [Ferrous Sulfate Er] Other (See Comments)    Shock   Wasp Venom Swelling    Level of Care/Admitting Diagnosis ED Disposition     ED Disposition  Admit   Condition  --   Comment  Hospital Area: [facility] [[id number]]  Level of Care: Telemetry Medical [104]  May place patient in observation at [facility] or [facility] if equivalent level of care is available:: No  Covid Evaluation: Asymptomatic - no recent exposure (last 10 days) testing not required  Diagnosis: Symptomatic anemia [3086578]  Admitting Physician: Earl Lagos [[id number]]  Attending Physician: Earl Lagos [[id number]]          B Medical/Surgery History 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   Past Surgical History:  Procedure Laterality Date   ABDOMINAL  HYSTERECTOMY     CARPAL TUNNEL RELEASE  05/13/2011   Procedure: CARPAL TUNNEL RELEASE;  Surgeon: Mable Paris, MD;  Location: [facility];  Service: Orthopedics;  Laterality: Left;   COLONOSCOPY N/A 03/02/2020   Procedure: COLONOSCOPY;  Surgeon: Bernette Redbird, MD;  Location: [facility] ENDOSCOPY;  Service: Endoscopy;  Laterality: N/A;   COLONOSCOPY WITH PROPOFOL N/A 04/28/2014   Procedure: COLONOSCOPY WITH PROPOFOL;  Surgeon: Florencia Reasons, MD;  Location: [facility] ENDOSCOPY;  Service: Endoscopy;  Laterality: N/A;   DG TOES*L*  2/10   rt   DILATION AND CURETTAGE OF UTERUS     ENTEROSCOPY N/A 10/17/2017   Procedure: ENTEROSCOPY;  Surgeon: Kathi Der, MD;  Location: [facility] ENDOSCOPY;  Service: Gastroenterology;  Laterality: N/A;   ESOPHAGOGASTRODUODENOSCOPY N/A 04/10/2014   Procedure: ESOPHAGOGASTRODUODENOSCOPY (EGD);  Surgeon: Shirley Friar, MD;  Location: [facility] ENDOSCOPY;  Service: Endoscopy;  Laterality: N/A;   ESOPHAGOGASTRODUODENOSCOPY N/A 05/10/2017   Procedure: ESOPHAGOGASTRODUODENOSCOPY (EGD);  Surgeon: Bernette Redbird, MD;  Location: [facility] ENDOSCOPY;  Service: Endoscopy;  Laterality: N/A;   ESOPHAGOGASTRODUODENOSCOPY N/A 09/22/2017   Procedure: ESOPHAGOGASTRODUODENOSCOPY (EGD);  Surgeon: Vida Rigger, MD;  Location: [facility] ENDOSCOPY;  Service: Endoscopy;  Laterality: N/A;  bedside   ESOPHAGOGASTRODUODENOSCOPY N/A 03/02/2020   Procedure: ESOPHAGOGASTRODUODENOSCOPY (EGD);  Surgeon: Bernette Redbird, MD;  Location: [facility] ENDOSCOPY;  Service: Endoscopy;  Laterality: N/A;   ESOPHAGOGASTRODUODENOSCOPY N/A 11/03/2020   Procedure: ESOPHAGOGASTRODUODENOSCOPY (EGD);  Surgeon: Charlott Rakes, MD;  Location: [facility] ENDOSCOPY;  Service: Endoscopy;  Laterality: N/A;  ESOPHAGOGASTRODUODENOSCOPY N/A 04/12/2022   Procedure: ESOPHAGOGASTRODUODENOSCOPY (EGD);  Surgeon: Vida Rigger, MD;  Location: [facility] ENDOSCOPY;  Service: Gastroenterology;  Laterality: N/A;   ESOPHAGOGASTRODUODENOSCOPY N/A 05/27/2022    Procedure: ESOPHAGOGASTRODUODENOSCOPY (EGD);  Surgeon: Willis Modena, MD;  Location: [facility] ENDOSCOPY;  Service: Gastroenterology;  Laterality: N/A;   ESOPHAGOGASTRODUODENOSCOPY (EGD) WITH PROPOFOL N/A 04/27/2014   Procedure: ESOPHAGOGASTRODUODENOSCOPY (EGD) WITH PROPOFOL;  Surgeon: Florencia Reasons, MD;  Location: [facility] ENDOSCOPY;  Service: Endoscopy;  Laterality: N/A;  possible apc   ESOPHAGOGASTRODUODENOSCOPY (EGD) WITH PROPOFOL N/A 09/30/2017   Procedure: ESOPHAGOGASTRODUODENOSCOPY (EGD) WITH PROPOFOL;  Surgeon: Kerin Salen, MD;  Location: [facility] ENDOSCOPY;  Service: Gastroenterology;  Laterality: N/A;   ESOPHAGOGASTRODUODENOSCOPY (EGD) WITH PROPOFOL N/A 10/01/2017   Procedure: ESOPHAGOGASTRODUODENOSCOPY (EGD) WITH PROPOFOL;  Surgeon: Kerin Salen, MD;  Location: [facility] ENDOSCOPY;  Service: Gastroenterology;  Laterality: N/A;   ESOPHAGOGASTRODUODENOSCOPY (EGD) WITH PROPOFOL N/A 10/08/2017   Procedure: ESOPHAGOGASTRODUODENOSCOPY (EGD) WITH PROPOFOL;  Surgeon: Kathi Der, MD;  [facility] ENDOSCOPY;  Service: Gastroenterology;  Laterality: N/A;   ESOPHAGOGASTRODUODENOSCOPY (EGD) WITH PROPOFOL N/A 10/17/2017   Procedure: ESOPHAGOGASTRODUODENOSCOPY (EGD) WITH PROPOFOL;  Surgeon: Kathi Der, MD;  Location[facility] ENDOSCOPY;  Service: Gastroenterology;  Laterality: N/A;   ESOPHAGOGASTRODUODENOSCOPY (EGD) WITH PROPOFOL N/A 10/19/2017   Procedure: ESOPHAGOGASTRODUODENOSCOPY (EGD) WITH PROPOFOL;  Surgeon: Kathi Der, MD;  [facility] ENDOSCOPY;  Service: Gastroenterology;  Laterality: N/A;   ESOPHAGOGASTRODUODENOSCOPY (EGD) WITH PROPOFOL N/A 12/04/2018   Procedure: ESOPHAGOGASTRODUODENOSCOPY (EGD) WITH PROPOFOL;  Surgeon: Charlott Rakes, MD;  Location: [facility] ENDOSCOPY;  Service: Endoscopy;  Laterality: N/A;   GIVENS CAPSULE STUDY N/A 10/02/2017   Procedure: GIVENS CAPSULE STUDY;  Surgeon: Kerin Salen, MD;  Location: [facility] ENDOSCOPY;  Service: Gastroenterology;  Laterality: N/A;   GIVENS CAPSULE STUDY N/A  10/08/2017   Procedure: GIVENS CAPSULE STUDY;  Surgeon: Kathi Der, MD;  Location: [facility] ENDOSCOPY;  Service: Gastroenterology;  Laterality: N/A;  endoscopic placement of capsule   GIVENS CAPSULE STUDY N/A 03/02/2020   Procedure: GIVENS CAPSULE STUDY;  Surgeon: Bernette Redbird, MD;  Location: [facility] ENDOSCOPY;  Service: Endoscopy;  Laterality: N/A;   HEMOSTASIS CLIP PLACEMENT  12/04/2018   Procedure: HEMOSTASIS CLIP PLACEMENT;  Surgeon: Charlott Rakes, MD;  Location: [facility] ENDOSCOPY;  Service: Endoscopy;;   HEMOSTASIS CLIP PLACEMENT  05/27/2022   Procedure: HEMOSTASIS CLIP PLACEMENT;  Surgeon: Willis Modena, MD;  Location: [facility] ENDOSCOPY;  Service: Gastroenterology;;   HEMOSTASIS CONTROL  05/27/2022   Procedure: HEMOSTASIS CONTROL;  Surgeon: Willis Modena, MD;  Location[facility] ENDOSCOPY;  Service: Gastroenterology;;   HOT HEMOSTASIS N/A 04/27/2014   Procedure: HOT HEMOSTASIS (ARGON PLASMA COAGULATION/BICAP);  Surgeon: Florencia Reasons, MD;  Location: [facility] ENDOSCOPY;  Service: Endoscopy;  Laterality: N/A;   HOT HEMOSTASIS N/A 09/30/2017   Procedure: HOT HEMOSTASIS (ARGON PLASMA COAGULATION/BICAP);  Surgeon: Kerin Salen, MD;  Location: [facility] ENDOSCOPY;  Service: Gastroenterology;  Laterality: N/A;   HOT HEMOSTASIS N/A 10/01/2017   Procedure: HOT HEMOSTASIS (ARGON PLASMA COAGULATION/BICAP);  Surgeon: Kerin Salen, MD;  Location: [facility] ENDOSCOPY;  Service: Gastroenterology;  Laterality: N/A;   HOT HEMOSTASIS N/A 10/17/2017   Procedure: HOT HEMOSTASIS (ARGON PLASMA COAGULATION/BICAP);  Surgeon: Kathi Der, MD;  Location: [facility] ENDOSCOPY;  Service: Gastroenterology;  Laterality: N/A;   HOT HEMOSTASIS N/A 10/19/2017   Procedure: HOT HEMOSTASIS (ARGON PLASMA COAGULATION/BICAP);  Surgeon: Kathi Der, MD;  Location: [facility] ENDOSCOPY;  Service: Gastroenterology;  Laterality: N/A;   HOT HEMOSTASIS N/A 03/02/2020   Procedure: HOT HEMOSTASIS (ARGON PLASMA COAGULATION/BICAP);  Surgeon: Bernette Redbird, MD;  Location: [facility]  ENDOSCOPY;  Service: Endoscopy;  Laterality: N/A;   HOT HEMOSTASIS N/A 04/12/2022   Procedure: HOT HEMOSTASIS (ARGON PLASMA COAGULATION/BICAP);  Surgeon: Vida Rigger, MD;  Location: [facility] ENDOSCOPY;  Service: Gastroenterology;  Laterality: N/A;   IR IMAGING GUIDED PORT INSERTION  07/08/2018   L shoulder Surgery  2011   POLYPECTOMY  03/02/2020   Procedure: POLYPECTOMY;  Surgeon: Bernette Redbird, MD;  Location: [facility] ENDOSCOPY;  Service: Endoscopy;;   Susa Day  11/03/2020   Procedure: Susa Day;  Surgeon: Charlott Rakes, MD;  Location[name] ENDOSCOPY;  Service: Endoscopy;;   SUBMUCOSAL INJECTION  09/22/2017   Procedure: SUBMUCOSAL INJECTION;  Surgeon: Vida Rigger, MD;  Location: [facility] ENDOSCOPY;  Service: Endoscopy;;   SUBMUCOSAL INJECTION  12/04/2018   Procedure: SUBMUCOSAL INJECTION;  Surgeon: Charlott Rakes, MD;  Location: [facility] ENDOSCOPY;  Service: Endoscopy;;     A IV Location/Drains/Wounds Patient Lines/Drains/Airways Status     Active Line/Drains/Airways     Name Placement date Placement time Site Days   Implanted Port 07/08/18 Right Chest 07/08/18  --  Chest  1541   Peripheral IV 09/25/22 Left Antecubital 09/25/22  2047  Antecubital  1            Intake/Output Last 24 hours  Intake/Output Summary (Last 24 hours) at 09/26/2022 0210 Last data filed at 09/25/2022 2047 Gross per 24 hour  Intake 600 ml  Output --  Net 600 ml    Labs/Imaging Results for orders placed or performed during the hospital encounter of 09/25/22 (from the past 48 hour(s))  CBC with Differential     Status: Abnormal   Collection Time: 09/25/22  9:34 PM  Result Value Ref Range   WBC 14.7 (H) 4.0 - 10.5 K/uL   RBC 2.79 (L) 3.87 - 5.11 MIL/uL   Hemoglobin 7.1 (L) 12.0 - 15.0 g/dL   HCT 40.9 (L) 81.1 - 91.4 %   MCV 87.1 80.0 - 100.0 fL   MCH 25.4 (L) 26.0 - 34.0 pg   MCHC 29.2 (L) 30.0 - 36.0 g/dL   RDW 78.2 (H) 95.6 - 21.3 %   Platelets 184 150 - 400 K/uL   nRBC 0.0 0.0 - 0.2 %   Neutrophils  Relative % 86 %   Neutro Abs 12.9 (H) 1.7 - 7.7 K/uL   Lymphocytes Relative 7 %   Lymphs Abs 1.0 0.7 - 4.0 K/uL   Monocytes Relative 5 %   Monocytes Absolute 0.7 0.1 - 1.0 K/uL   Eosinophils Relative 1 %   Eosinophils Absolute 0.1 0.0 - 0.5 K/uL   Basophils Relative 0 %   Basophils Absolute 0.1 0.0 - 0.1 K/uL   Immature Granulocytes 1 %   Abs Immature Granulocytes 0.07 0.00 - 0.07 K/uL    Comment: Performed at [facility], [address] [place], [place] [address]  Comprehensive metabolic panel     Status: Abnormal   Collection Time: 09/25/22  9:34 PM  Result Value Ref Range   Sodium 137 135 - 145 mmol/L   Potassium 3.7 3.5 - 5.1 mmol/L   Chloride 110 98 - 111 mmol/L   CO2 21 (L) 22 - 32 mmol/L   Glucose, Bld 105 (H) 70 - 99 mg/dL    Comment: Glucose reference range applies only to samples taken after fasting for at least 8 hours.   BUN 10 8 - 23 mg/dL   Creatinine, Ser 8.46 0.44 - 1.00 mg/dL   Calcium 8.0 (L) 8.9 - 10.3 mg/dL   Total Protein 5.4 (L) 6.5 - 8.1 g/dL  Albumin 2.7 (L) 3.5 - 5.0 g/dL   AST 25 15 - 41 U/L   ALT 10 0 - 44 U/L   Alkaline Phosphatase 56 38 - 126 U/L   Total Bilirubin 0.4 0.3 - 1.2 mg/dL   GFR, Estimated >41 >66 mL/min    Comment: (NOTE) Calculated using the CKD-EPI Creatinine Equation (2021)    Anion gap 6 5 - 15    Comment: Performed at [facility], [address] [place], [place] [address]  Lipase, blood     Status: None   Collection Time: 09/25/22  9:34 PM  Result Value Ref Range   Lipase 29 11 - 51 U/L    Comment: Performed at [facility], [address], [place], [place] [address]  I-stat chem 8, ED (not at [facility], [facility] or [facility])     Status: Abnormal   Collection Time: 09/25/22 10:03 PM  Result Value Ref Range   Sodium 141 135 - 145 mmol/L   Potassium 3.7 3.5 - 5.1 mmol/L   Chloride 109 98 - 111 mmol/L   BUN 12 8 - 23 mg/dL   Creatinine, Ser 3.23 0.44 - 1.00 mg/dL   Glucose, Bld 557 (H) 70 - 99 mg/dL    Comment:  Glucose reference range applies only to samples taken after fasting for at least 8 hours.   Calcium, Ion 1.15 1.15 - 1.40 mmol/L   TCO2 24 22 - 32 mmol/L   Hemoglobin 8.2 (L) 12.0 - 15.0 g/dL   HCT 32.2 (L) 02.5 - 42.7 %  Urinalysis, w/ Reflex to Culture (Infection Suspected) -Urine, Clean Catch     Status: Abnormal   Collection Time: 09/25/22 10:37 PM  Result Value Ref Range   Specimen Source URINE, CLEAN CATCH    Color, Urine YELLOW YELLOW   APPearance HAZY (A) CLEAR   Specific Gravity, Urine 1.042 (H) 1.005 - 1.030   pH 6.0 5.0 - 8.0   Glucose, UA NEGATIVE NEGATIVE mg/dL   Hgb urine dipstick NEGATIVE NEGATIVE   Bilirubin Urine NEGATIVE NEGATIVE   Ketones, ur NEGATIVE NEGATIVE mg/dL   Protein, ur 062 (A) NEGATIVE mg/dL   Nitrite POSITIVE (A) NEGATIVE   Leukocytes,Ua SMALL (A) NEGATIVE   RBC / HPF 0-5 0 - 5 RBC/hpf   WBC, UA 21-50 0 - 5 WBC/hpf    Comment:        Reflex urine culture not performed if WBC <=10, OR if Squamous epithelial cells >5. If Squamous epithelial cells >5 suggest recollection.    Bacteria, UA RARE (A) NONE SEEN   Squamous Epithelial / HPF 0-5 0 - 5 /HPF   Mucus PRESENT    Hyaline Casts, UA PRESENT     Comment: Performed at [facility], [address], [place], [place] [address]  Type and screen [facility]     Status: None   Collection Time: 09/25/22 11:38 PM  Result Value Ref Range   ABO/RH(D) A NEG    Antibody Screen NEG    Sample Expiration      09/28/2022,2359 Performed at [facility], [address] [place], [place] [address]    *Note: Due to a large number of results and/or encounters for the requested time period, some results have not been displayed. A complete set of results can be found in Results Review.   CT ANGIO GI BLEED  Result Date: 09/25/2022 CLINICAL DATA:  Abdominal pain, acute, nonlocalized.  Nausea EXAM: CTA ABDOMEN AND  PELVIS WITHOUT AND WITH CONTRAST TECHNIQUE: Multidetector CT imaging of the  abdomen and pelvis was performed using the standard protocol during bolus administration of intravenous contrast. Multiplanar reconstructed images and MIPs were obtained and reviewed to evaluate the vascular anatomy. RADIATION DOSE REDUCTION: This exam was performed according to the departmental dose-optimization program which includes automated exposure control, adjustment of the mA and/or kV according to patient size and/or use of iterative reconstruction technique. CONTRAST:  OMNIPAQUE IOHEXOL 350 MG/ML SOLN COMPARISON:  None Available. FINDINGS: VASCULAR Aorta: Scattered infrarenal aortic atherosclerosis. No aneurysm or dissection. No significant stenosis. Celiac: Patent without evidence of aneurysm, dissection, vasculitis or significant stenosis. SMA: Patent without evidence of aneurysm, dissection, vasculitis or significant stenosis. Renals: Both renal arteries are patent without evidence of aneurysm, dissection, vasculitis, fibromuscular dysplasia or significant stenosis. IMA: Patent without evidence of aneurysm, dissection, vasculitis or significant stenosis. Inflow: Moderate atherosclerotic calcifications. No aneurysm, dissection, or significant stenosis. Proximal Outflow: Scattered calcifications. No aneurysm, dissection or significant stenosis. Veins: No obvious venous abnormality within the limitations of this arterial phase study. Review of the MIP images confirms the above findings. NON-VASCULAR Lower chest: No acute abnormality. Linear scarring or chronic atelectasis in the lung bases, stable. Hepatobiliary: Small calcification within the gallbladder, stable. Gallbladder is nondistended. No biliary ductal dilatation. No focal hepatic abnormality. Pancreas: No focal abnormality or ductal dilatation. Spleen: No focal abnormality.  Normal size. Adrenals/Urinary Tract: Small cyst in the upper pole of the right kidney, stable. No suspicious renal or adrenal abnormality. No follow-up imaging  recommended. No hydronephrosis. Urinary bladder unremarkable. Stomach/Bowel: Surgical clips within and around the stomach. Stomach is moderately distended. No active extravasation of contrast within the bowel to localize GI bleed. Small and large bowel decompressed, unremarkable. No bowel obstruction. Lymphatic: No adenopathy Reproductive: Prior hysterectomy.  No adnexal masses. Other: No free fluid or free air. Musculoskeletal: No acute bony abnormality. IMPRESSION: VASCULAR Aortoiliac atherosclerosis.  No aneurysm or dissection. No active contrast extravasation to localize GI bleed. NON-VASCULAR Moderate distention of the stomach with debris. Cholelithiasis.  No CT evidence of acute cholecystitis. No acute findings. Electronically Signed   By: Charlett Nose M.D.   On: 09/25/2022 23:25    Pending Labs Unresulted Labs (From admission, onward)     Start     Ordered   09/26/22 0500  Iron and TIBC  Tomorrow morning,   R        09/26/22 0054   09/26/22 0500  Ferritin  Tomorrow morning,   R        09/26/22 0054   09/26/22 0500  Basic metabolic panel  Tomorrow morning,   R        09/26/22 0054   09/26/22 0500  CBC  Tomorrow morning,   R        09/26/22 0054   09/25/22 2237  Urine Culture  Once,   R        09/25/22 2237            Vitals/Pain Today's Vitals   09/25/22 2056 09/25/22 2057 09/25/22 2329 09/26/22 0107  BP:   (!) 156/79 (!) 154/79  Pulse:   (!) 55 72  Resp:   16 18  Temp:   98 F (36.7 C) 97.9 F (36.6 C)  TempSrc:   Oral Oral  SpO2:   100% 100%  Weight:  102.1 kg    Height:  5\' 5"  (1.651 m)    PainSc: 0-No pain  2  8  Isolation Precautions No active isolations  Medications Medications  acetaminophen (TYLENOL) tablet 1,000 mg (has no administration in time range)    Or  acetaminophen (TYLENOL) suppository 650 mg (has no administration in time range)  ondansetron (ZOFRAN) tablet 4 mg (has no administration in time range)    Or  ondansetron (ZOFRAN) injection 4  mg (has no administration in time range)  nicotine (NICODERM CQ - dosed in mg/24 hours) patch 14 mg (has no administration in time range)  ALPRAZolam (XANAX) tablet 0.5 mg (has no administration in time range)  citalopram (CELEXA) tablet 20 mg (has no administration in time range)  sodium chloride 0.9 % bolus 500 mL (500 mLs Intravenous Bolus 09/25/22 2244)  ondansetron (ZOFRAN) injection 4 mg (4 mg Intravenous Given 09/25/22 2245)  pantoprazole (PROTONIX) injection 40 mg (40 mg Intravenous Given 09/25/22 2245)  iohexol (OMNIPAQUE) 350 MG/ML injection 100 mL (100 mLs Intravenous Contrast Given 09/25/22 2313)  lactated ringers bolus 1,000 mL (1,000 mLs Intravenous New Bag/Given 09/26/22 0142)    Mobility walks     Focused Assessments Cardiac Assessment Handoff:    Lab Results  Component Value Date   CKTOTAL 57 02/01/2008   CKMB 0.7 02/01/2008   TROPONINI 0.04 (HH) 09/22/2017   Lab Results  Component Value Date   DDIMER 0.75 (H) 10/16/2017   Does the Patient currently have chest pain? No    R Recommendations: See Admitting Provider Note  Report given to:   Additional Notes:  Pt requesting xanax to take once she gets to the floor. Also wants her home trazodone but MD did not order

## 2022-09-26 NOTE — TOC Initial Note (Signed)
Transition of Care [facility]) - Initial/Assessment Note    Patient Details  Name: Peggy House MRN: [id number] Date of Birth: 01-07-1961  Transition of Care [facility]) CM/SW Contact:    Kingsley Plan, RN Phone Number: 09/26/2022, 1:26 PM  Clinical Narrative:                  Spoke to patient at bedside.   Patient from home with son and his family   Prior to admission patient did not use any DME.   Patient has Medicaid and transportation to appointments and prescription coverage.   On admission patient noted to have housing food, etc  insecurities. Patient states "sometimes it's hard to pay the bills."  Added resources to AVS  Expected Discharge Plan: Home/Self Care Barriers to Discharge: Continued Medical Work up   Patient Goals and CMS Choice Patient states their goals for this hospitalization and ongoing recovery are:: to return to home     [place] ownership interest in [facility].provided to:: Patient    Expected Discharge Plan and Services       Living arrangements for the past 2 months: Single Family Home                 DME Arranged: N/A         HH Arranged: NA          Prior Living Arrangements/Services Living arrangements for the past 2 months: Single Family Home Lives with:: Adult Children Patient language and need for interpreter reviewed:: Yes Do you feel safe going back to the place where you live?: Yes      Need for Family Participation in Patient Care: Yes (Comment) Care giver support system in place?: Yes (comment)   Criminal Activity/Legal Involvement Pertinent to Current Situation/Hospitalization: No - Comment as needed  Activities of Daily Living Home Assistive Devices/Equipment: Environmental consultant (specify type) Designer, multimedia) ADL Screening (condition at time of admission) Patient's cognitive ability adequate to safely complete daily activities?: Yes Is the patient deaf or have difficulty hearing?: No Does the patient  have difficulty seeing, even when wearing glasses/contacts?: No Does the patient have difficulty concentrating, remembering, or making decisions?: Yes Patient able to express need for assistance with ADLs?: Yes Does the patient have difficulty dressing or bathing?: Yes Independently performs ADLs?: No Communication: Independent Dressing (OT): Needs assistance Grooming: Needs assistance Feeding: Independent Bathing: Needs assistance Is this a change from baseline?: Pre-admission baseline Toileting: Needs assistance Is this a change from baseline?: Pre-admission baseline In/Out Bed: Needs assistance Is this a change from baseline?: Pre-admission baseline Walks in Home: Independent Does the patient have difficulty walking or climbing stairs?: Yes Weakness of Legs: Both Weakness of Arms/Hands: None  Permission Sought/Granted   Permission granted to share information with : No              Emotional Assessment Appearance:: Appears stated age Attitude/Demeanor/Rapport: Engaged Affect (typically observed): Accepting Orientation: : Oriented to Self, Oriented to Place, Oriented to  Time, Oriented to Situation Alcohol / Substance Use: Not Applicable Psych Involvement: No (comment)  Admission diagnosis:  Generalized abdominal pain [R10.84] Symptomatic anemia [D64.9] Iron deficiency anemia, unspecified iron deficiency anemia type [D50.9] Patient Active Problem List   Diagnosis Date Noted   Symptomatic anemia 09/26/2022   Tobacco use disorder 06/22/2022   Depression with anxiety 04/12/2022   HHT (hereditary hemorrhagic telangiectasia) (HCC) 03/23/2021   Syncope 11/01/2020   Trapezius muscle spasm 07/22/2020   Acute GI bleeding 03/01/2020  Leg pain, bilateral 09/28/2019   Urge incontinence 09/28/2019   Pain due to onychomycosis of toenails of both feet 02/26/2019   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   Anemia due to  GI blood loss 12/07/2018   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   Acute deep vein thrombosis (DVT) of right upper extremity (HCC) 12/07/2018   Hypokalemia    Port-A-Cath in place 09/18/2018   GAD (generalized anxiety disorder) 01/20/2018   GI bleeding 10/25/2017   Pulmonary artery hypertension (HCC) 10/19/2017   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   Venous stasis dermatitis of both lower extremities 05/07/2017   Recurrent epistaxis 11/29/2016   Iron deficiency anemia 10/29/2016   Osteoarthritis, multiple sites 06/05/2015   Insomnia 06/05/2015   Major depressive disorder, recurrent episode (HCC) 06/05/2015   Obesity, Class III, BMI 40-49.9 (morbid obesity) (HCC) 04/26/2014   GI bleed 04/25/2014   HLD (hyperlipidemia) 04/25/2014   Leg swelling 04/25/2014   Anxiety    Essential hypertension    Acute on chronic blood loss anemia 04/07/2014   PCP:  Olegario Messier, MD Pharmacy:   [facility] [phone] - [place] Otto, [place] - [address] AT [facility] [address] [facility] [address] [place] [address] Phone: [phone] Fax: [phone]  [facility] [address] [place] [address] Phone: [phone] Fax: [phone]     Social Determinants of Health (SDOH) Social History: SDOH Screenings   Food Insecurity: Food Insecurity Present (09/26/2022)  Housing: Medium Risk (09/26/2022)  Transportation Needs: No Transportation Needs (09/26/2022)  Utilities: At Risk (09/26/2022)  Depression (PHQ2-9): High Risk (06/20/2022)  Tobacco Use: High Risk (09/25/2022)   SDOH Interventions:     Readmission Risk Interventions     No data to display

## 2022-09-26 NOTE — Progress Notes (Signed)
Patient had black BM this afternoon and was unable to eat food due to nausea. MD Sherrilee Gilles made aware.

## 2022-09-27 ENCOUNTER — Ambulatory Visit: Payer: Medicaid Other

## 2022-09-27 ENCOUNTER — Inpatient Hospital Stay (HOSPITAL_COMMUNITY): Payer: Medicaid Other | Admitting: Anesthesiology

## 2022-09-27 ENCOUNTER — Other Ambulatory Visit: Payer: Medicaid Other

## 2022-09-27 ENCOUNTER — Encounter (HOSPITAL_COMMUNITY)
Admission: EM | Disposition: A | Discharge status: Home / Self Care | Payer: Self-pay | Source: Home / Self Care | Attending: Internal Medicine

## 2022-09-27 DIAGNOSIS — G479 Sleep disorder, unspecified: Secondary | ICD-10-CM | POA: Diagnosis not present

## 2022-09-27 DIAGNOSIS — I10 Essential (primary) hypertension: Secondary | ICD-10-CM

## 2022-09-27 DIAGNOSIS — K921 Melena: Secondary | ICD-10-CM | POA: Diagnosis not present

## 2022-09-27 DIAGNOSIS — R110 Nausea: Secondary | ICD-10-CM | POA: Diagnosis not present

## 2022-09-27 DIAGNOSIS — D649 Anemia, unspecified: Secondary | ICD-10-CM

## 2022-09-27 DIAGNOSIS — R197 Diarrhea, unspecified: Secondary | ICD-10-CM | POA: Diagnosis not present

## 2022-09-27 DIAGNOSIS — I780 Hereditary hemorrhagic telangiectasia: Secondary | ICD-10-CM | POA: Diagnosis not present

## 2022-09-27 DIAGNOSIS — F17210 Nicotine dependence, cigarettes, uncomplicated: Secondary | ICD-10-CM

## 2022-09-27 DIAGNOSIS — D62 Acute posthemorrhagic anemia: Secondary | ICD-10-CM

## 2022-09-27 DIAGNOSIS — T182XXA Foreign body in stomach, initial encounter: Secondary | ICD-10-CM | POA: Diagnosis not present

## 2022-09-27 DIAGNOSIS — F17200 Nicotine dependence, unspecified, uncomplicated: Secondary | ICD-10-CM | POA: Diagnosis not present

## 2022-09-27 DIAGNOSIS — R109 Unspecified abdominal pain: Secondary | ICD-10-CM | POA: Diagnosis not present

## 2022-09-27 DIAGNOSIS — K449 Diaphragmatic hernia without obstruction or gangrene: Secondary | ICD-10-CM | POA: Diagnosis not present

## 2022-09-27 DIAGNOSIS — E119 Type 2 diabetes mellitus without complications: Secondary | ICD-10-CM | POA: Diagnosis not present

## 2022-09-27 DIAGNOSIS — F329 Major depressive disorder, single episode, unspecified: Secondary | ICD-10-CM | POA: Diagnosis not present

## 2022-09-27 HISTORY — PX: HEMOSTASIS CLIP PLACEMENT: SHX6857

## 2022-09-27 HISTORY — PX: ESOPHAGOGASTRODUODENOSCOPY: SHX5428

## 2022-09-27 LAB — CBC
HCT: 26.8 % — ABNORMAL LOW (ref 36.0–46.0)
HCT: 29.2 % — ABNORMAL LOW (ref 36.0–46.0)
Hemoglobin: 8.2 g/dL — ABNORMAL LOW (ref 12.0–15.0)
Hemoglobin: 8.8 g/dL — ABNORMAL LOW (ref 12.0–15.0)
MCH: 25.8 pg — ABNORMAL LOW (ref 26.0–34.0)
MCH: 25.5 pg — ABNORMAL LOW (ref 26.0–34.0)
MCHC: 30.6 g/dL (ref 30.0–36.0)
MCHC: 30.1 g/dL (ref 30.0–36.0)
MCV: 84.3 fL (ref 80.0–100.0)
MCV: 84.6 fL (ref 80.0–100.0)
Platelets: 181 10*3/uL (ref 150–400)
Platelets: 178 10*3/uL (ref 150–400)
RBC: 3.18 MIL/uL — ABNORMAL LOW (ref 3.87–5.11)
RBC: 3.45 MIL/uL — ABNORMAL LOW (ref 3.87–5.11)
RDW: 25.3 % — ABNORMAL HIGH (ref 11.5–15.5)
RDW: 25.3 % — ABNORMAL HIGH (ref 11.5–15.5)
WBC: 11.3 10*3/uL — ABNORMAL HIGH (ref 4.0–10.5)
WBC: 11.5 10*3/uL — ABNORMAL HIGH (ref 4.0–10.5)
nRBC: 0 % (ref 0.0–0.2)
nRBC: 0 % (ref 0.0–0.2)

## 2022-09-27 LAB — TYPE AND SCREEN
ABO/RH(D): A NEG
Antibody Screen: NEGATIVE
Unit division: 0

## 2022-09-27 LAB — BPAM RBC: Blood Product Expiration Date: 202406202359

## 2022-09-27 LAB — URINE CULTURE

## 2022-09-27 LAB — GLUCOSE, CAPILLARY: Glucose-Capillary: 75 mg/dL (ref 70–99)

## 2022-09-27 SURGERY — EGD (ESOPHAGOGASTRODUODENOSCOPY)
Anesthesia: Monitor Anesthesia Care

## 2022-09-27 MED ORDER — SODIUM CHLORIDE 0.9 % IV SOLN: INTRAVENOUS | Status: DC

## 2022-09-27 MED ORDER — LIDOCAINE 2% (20 MG/ML) 5 ML SYRINGE
INTRAMUSCULAR | Status: DC | PRN
  Administered 2022-09-27: 60 mg via INTRAVENOUS

## 2022-09-27 MED ORDER — PROPOFOL 500 MG/50ML IV EMUL
INTRAVENOUS | Status: DC | PRN
  Administered 2022-09-27: 125 ug/kg/min via INTRAVENOUS

## 2022-09-27 MED ORDER — LISINOPRIL 20 MG PO TABS
20.0000 mg | ORAL_TABLET | Freq: Every day | ORAL | Status: DC
  Administered 2022-09-27 – 2022-09-28 (×2): 20 mg via ORAL
  Filled 2022-09-27 (×2): qty 1

## 2022-09-27 MED ORDER — PROPOFOL 10 MG/ML IV BOLUS
INTRAVENOUS | Status: DC | PRN
  Administered 2022-09-27 (×2): 10 mg via INTRAVENOUS

## 2022-09-27 MED ORDER — LACTATED RINGERS IV SOLN: INTRAVENOUS | Status: DC

## 2022-09-27 NOTE — Evaluation (Signed)
Occupational Therapy Evaluation Patient Details Name: Peggy House MRN: [id number] DOB: July 13, 1960 Today's Date: 09/27/2022   History of Present Illness Pt is a 62 y/o female presenting on 6/12 with nausea, diarrhea and abdominal discomfort. Admitted for acute on chronic anemia. S/P EGD 6/14. PMH includes: anxiety, arthritis, HTN, obesity, DM2.   Clinical Impression   PTA patient reports using RW for mobility (and at times needing assist for transfers due to L knee arthritis), completing ADLs with assist from sons girlfriend as needed (mostly for LB, sponge bathing at baseline).  Admitted for above and presents with problem list below.  She requires mod assist for initial transfer from elevated EOB, fading to min assist with repetition, using RW for functional mobility with min guard assist (slow and guarded but not LOB), and completing ADLs with setup to mod assist.  Pt reports her son will be home 24/7 for a few more weeks (before he starts his job again).  Based on performance today, believe pt will best benefit from continued OT services acutely and after dc at [facility] level to optimize independence, safety with ADLs and mobility.      Recommendations for follow up therapy are one component of a multi-disciplinary discharge planning process, led by the attending physician.  Recommendations may be updated based on patient status, additional functional criteria and insurance authorization.   Assistance Recommended at Discharge Frequent or constant Supervision/Assistance  Patient can return home with the following A little help with walking and/or transfers;A lot of help with bathing/dressing/bathroom;Assistance with cooking/housework;Assist for transportation;Help with stairs or ramp for entrance    Functional Status Assessment  Patient has had a recent decline in their functional status and demonstrates the ability to make significant improvements in function in a reasonable and  predictable amount of time.  Equipment Recommendations  BSC/3in1    Recommendations for Other Services       Precautions / Restrictions Precautions Precautions: Fall Restrictions Weight Bearing Restrictions: No      Mobility Bed Mobility               General bed mobility comments: EOB upon entry    Transfers Overall transfer level: Needs assistance Equipment used: Rolling walker (2 wheels) Transfers: Sit to/from Stand Sit to Stand: Mod assist, Min assist           General transfer comment: mod assist initally from EOB, but with practice fading to min assist; cueing for hand placement      Balance Overall balance assessment: Needs assistance Sitting-balance support: No upper extremity supported, Feet supported Sitting balance-Leahy Scale: Fair Sitting balance - Comments: limited dynamically   Standing balance support: Bilateral upper extremity supported, During functional activity, Single extremity supported Standing balance-Leahy Scale: Poor Standing balance comment: relies on RW                           ADL either performed or assessed with clinical judgement   ADL Overall ADL's : Needs assistance/impaired     Grooming: Set up;Sitting           Upper Body Dressing : Set up;Sitting   Lower Body Dressing: Moderate assistance;Sit to/from stand   Toilet Transfer: Moderate assistance;Minimal assistance;Ambulation;Rolling walker (2 wheels);BSC/3in1           Functional mobility during ADLs: Min guard;Rolling walker (2 wheels);Minimal assistance       Vision   Vision Assessment?: No apparent visual deficits     Perception  Praxis      Pertinent Vitals/Pain Pain Assessment Pain Assessment: No/denies pain     Hand Dominance Right   Extremity/Trunk Assessment Upper Extremity Assessment Upper Extremity Assessment: Generalized weakness (limited shoulder ROM to ~75* but reports baseline)   Lower Extremity  Assessment Lower Extremity Assessment: Defer to PT evaluation       Communication Communication Communication: No difficulties   Cognition Arousal/Alertness: Awake/alert Behavior During Therapy: WFL for tasks assessed/performed Overall Cognitive Status: Within Functional Limits for tasks assessed                                 General Comments: appears baseline     General Comments       Exercises     Shoulder Instructions      Home Living Family/patient expects to be discharged to:: Private residence Living Arrangements: Children Available Help at Discharge: Family;Available 24 hours/day Type of Home: House Home Access: Stairs to enter;Elevator     Home Layout: One level     Bathroom Shower/Tub: Sponge bathes at baseline   Bathroom Toilet: Standard     Home Equipment: Agricultural consultant (2 wheels)   Additional Comments: lives with son and granddaughter      Prior Functioning/Environment Prior Level of Function : Needs assist             Mobility Comments: using RW for mobility as needed ADLs Comments: pt reports needing some assist for ADls from sons girlfriend, used to have aide assist.  Family completes IADLs.  sponge bathing only.        OT Problem List: Decreased strength;Decreased activity tolerance;Impaired balance (sitting and/or standing);Decreased knowledge of use of DME or AE;Decreased safety awareness;Decreased knowledge of precautions;Obesity      OT Treatment/Interventions: Self-care/ADL training;Therapeutic exercise;DME and/or AE instruction;Therapeutic activities;Energy conservation;Patient/family education;Balance training    OT Goals(Current goals can be found in the care plan section) Acute Rehab OT Goals Patient Stated Goal: home OT Goal Formulation: With patient Time For Goal Achievement: 10/11/22 Potential to Achieve Goals: Good  OT Frequency: Min 2X/week    Co-evaluation              AM-PAC OT "6 Clicks"  Daily Activity     Outcome Measure Help from another person eating meals?: None Help from another person taking care of personal grooming?: A Little Help from another person toileting, which includes using toliet, bedpan, or urinal?: A Lot Help from another person bathing (including washing, rinsing, drying)?: A Lot Help from another person to put on and taking off regular upper body clothing?: A Little Help from another person to put on and taking off regular lower body clothing?: A Lot 6 Click Score: 16   End of Session Equipment Utilized During Treatment: Gait belt;Rolling walker (2 wheels) Nurse Communication: Mobility status  Activity Tolerance: Patient tolerated treatment well Patient left: with call bell/phone within reach;with bed alarm set;Other (comment) (sitting EOB)  OT Visit Diagnosis: Other abnormalities of gait and mobility (R26.89);Muscle weakness (generalized) (M62.81)                Time: 4098-1191 OT Time Calculation (min): 21 min Charges:  OT General Charges $OT Visit: 1 Visit OT Evaluation $OT Eval Moderate Complexity: 1 Mod  Barry Brunner, OT Acute Rehabilitation Services Office [phone]   Chancy Milroy 09/27/2022, 2:38 PM

## 2022-09-27 NOTE — Progress Notes (Signed)
OT Cancellation Note  Patient Details Name: Peggy House MRN: [id number] DOB: 1961/03/05   Cancelled Treatment:    Reason Eval/Treat Not Completed: Patient at procedure or test/ unavailable. Will follow and see as able.   Barry Brunner, OT Acute Rehabilitation Services Office [phone]   Chancy Milroy 09/27/2022, 9:57 AM

## 2022-09-27 NOTE — Anesthesia Postprocedure Evaluation (Signed)
Anesthesia Post Note  Patient: Peggy House  Procedure(s) Performed: ESOPHAGOGASTRODUODENOSCOPY (EGD) HEMOSTASIS CLIP PLACEMENT     Patient location during evaluation: PACU Anesthesia Type: MAC Level of consciousness: awake and alert Pain management: pain level controlled Vital Signs Assessment: post-procedure vital signs reviewed and stable Respiratory status: spontaneous breathing, nonlabored ventilation, respiratory function stable and patient connected to nasal cannula oxygen Cardiovascular status: stable and blood pressure returned to baseline Postop Assessment: no apparent nausea or vomiting Anesthetic complications: no   No notable events documented.  Last Vitals:  Vitals:   09/27/22 1120 09/27/22 1130  BP: (!) 145/72 127/73  Pulse: 66 68  Resp: 16 18  Temp:    SpO2: 97% 97%    Last Pain:  Vitals:   09/27/22 1130  TempSrc:   PainSc: 0-No pain                 Silva Aamodt

## 2022-09-27 NOTE — Progress Notes (Signed)
HD#1 Subjective:   Summary: Peggy House is a 62 y.o. female with PMH of HHT, T2DM, MDD, tobacco use who presents with abdominal discomfort, nausea, diarrhea with orthostatic near syncope and admitted for acute on chronic anemia.  Overnight Events: none  Patient was seen after EGD today.  Patient still notes some abdominal discomfort and states her stomach is sore.  States that yesterday she was having some black stools.  No further black stools noted this morning.  No new concerns.  Discussed the plan with patient and answered all questions.   Objective:  Vital signs in last 24 hours: Vitals:   09/26/22 1237 09/26/22 1450 09/26/22 2039 09/27/22 0610  BP: (!) 129/51 (!) 146/64 (!) 156/65 (!) 156/83  Pulse: 76 73 72 74  Resp: 16 14 17 16   Temp: 98.3 F (36.8 C) 98.2 F (36.8 C) 98.6 F (37 C) 98.2 F (36.8 C)  TempSrc: Oral Oral Oral Oral  SpO2: 97% 100% 98% 97%  Weight:      Height:       Supplemental O2: Room Air SpO2: 97 %   Physical Exam:  Constitutional: alert, lying in bed comfortably, in no acute distress Cardiovascular: regular rate and rhythm, systolic murmur heard best at RUSB, no LE edema Pulmonary/Chest: normal work of breathing on room air, anterior breath sounds clear Abdominal: bowel sounds present, soft, minimal diffuse tenderness, non-distended, no guarding or rebound MSK: normal bulk and tone Neurological: alert & oriented x 3 Skin: warm and dry Psych: pleasant mood   Filed Weights   09/25/22 2057  Weight: 102.1 kg     Intake/Output Summary (Last 24 hours) at 09/27/2022 0657 Last data filed at 09/26/2022 1600 Gross per 24 hour  Intake 350.33 ml  Output --  Net 350.33 ml    Net IO Since Admission: 1,980.33 mL [09/27/22 0657]  Pertinent Labs:    Latest Ref Rng & Units 09/27/2022   12:14 AM 09/26/2022    4:26 PM 09/26/2022    8:08 AM  CBC  WBC 4.0 - 10.5 K/uL 11.3   10.6   Hemoglobin 12.0 - 15.0 g/dL 8.2  8.3  6.5   Hematocrit  36.0 - 46.0 % 26.8  26.3  22.3   Platelets 150 - 400 K/uL 181   164        Latest Ref Rng & Units 09/26/2022    8:08 AM 09/25/2022   10:03 PM 09/25/2022    9:34 PM  CMP  Glucose 70 - 99 mg/dL 96  161  096   BUN 8 - 23 mg/dL 15  12  10    Creatinine 0.44 - 1.00 mg/dL 0.45  4.09  8.11   Sodium 135 - 145 mmol/L 141  141  137   Potassium 3.5 - 5.1 mmol/L 3.8  3.7  3.7   Chloride 98 - 111 mmol/L 111  109  110   CO2 22 - 32 mmol/L 24   21   Calcium 8.9 - 10.3 mg/dL 8.3   8.0   Total Protein 6.5 - 8.1 g/dL   5.4   Total Bilirubin 0.3 - 1.2 mg/dL   0.4   Alkaline Phos 38 - 126 U/L   56   AST 15 - 41 U/L   25   ALT 0 - 44 U/L   10     Imaging: No results found.  Assessment/Plan:   Principal Problem:   Acute on chronic blood loss anemia Active Problems:   Major depressive  disorder, recurrent episode (HCC)   Type 2 diabetes mellitus with vascular disease (HCC)   HHT (hereditary hemorrhagic telangiectasia) (HCC)   Symptomatic anemia   Patient Summary: Peggy House is a 62 y.o. with a pertinent PMH of HHT, T2DM, MDD, tobacco use who presents with abdominal discomfort, nausea, diarrhea with orthostatic near syncope and admitted for acute on chronic anemia.  #Acute on chronic normocytic anemia #Hereditary hemorrhagic telangiectasia #Melena Patient had 2 melanotic stools late yesterday which she states did not have before then. Patient states is different compared to her usual stools.  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  Denies any further epistaxis.  Status post 1 unit PRBC and hemoglobin stable at 8.2 this morning.  Negative orthostatic vitals. -Appreciate GI assistance -Full liquid diet today -repeat CBC 5PM and tomorrow AM -Transfusion protocol if hemoglobin <7  #Abdominal pain with nausea and diarrhea Recent history of nonbloody diarrhea with loose stools. Noted  melanotic stools late yesterday. Some nausea after EGD. Still some abdominal discomfort but better than before. CT abdomen and pelvis done on admission without acute findings or bleeding. Minimal abdominal tenderness, non-distended, no guarding or rebound.  -Continue to monitor  #HTN BP elevated this morning, restarted home lisinopril 20 mg daily.  #T2DM, well-controlled Last A1c 5% on metformin. Continue CBG monitoring.  #MDD #Sleep disturbance -Continue home citalopram 20 mg and Xanax 0.5 mg nightly PRN  #Tobacco use States 7-10 cigarettes a day. Has tried to quit but unsuccessful.  -Nicotine patch during admission   Diet:  Full Liquid IVF: None,None VTE: SCDs Code: Full PT/OT recs:  HH OT, pending PT Family Update: no family at bedside this morning, attempted to call sons today  Dispo: Anticipated discharge to Home in 1-2 days pending GI and stable Hgb.  Rana Snare, DO Internal Medicine Resident PGY-1 Pager: [phone] Please contact the on call pager after 5 pm and on weekends at [phone].

## 2022-09-27 NOTE — Anesthesia Preprocedure Evaluation (Addendum)
Anesthesia Evaluation  Patient identified by MRN, date of birth, ID band Patient awake    Reviewed: Allergy & Precautions, H&P , NPO status , Patient's Chart, lab work & pertinent test results  Airway Mallampati: III       Dental no notable dental hx. (+) Edentulous Upper, Edentulous Lower,    Pulmonary Current Smoker and Patient abstained from smoking.   Pulmonary exam normal        Cardiovascular Exercise Tolerance: Good hypertension, Pt. on medications and Pt. on home beta blockers Normal cardiovascular exam     Neuro/Psych   Anxiety Depression    negative neurological ROS  negative psych ROS   GI/Hepatic negative GI ROS, Neg liver ROS,GERD  Medicated and Controlled,,  Endo/Other  diabetes, Type 2, Oral Hypoglycemic Agents    Renal/GU negative Renal ROS  negative genitourinary   Musculoskeletal  (+) Arthritis ,    Abdominal  (+) + obese  Peds  Hematology negative hematology ROS (+)   Anesthesia Other Findings   Reproductive/Obstetrics negative OB ROS                             Anesthesia Physical Anesthesia Plan  ASA: 3  Anesthesia Plan: MAC   Post-op Pain Management: Minimal or no pain anticipated   Induction: Intravenous  PONV Risk Score and Plan: 2 and Propofol infusion  Airway Management Planned: Mask, Natural Airway and Simple Face Mask  Additional Equipment: None  Intra-op Plan:   Post-operative Plan:   Informed Consent: I have reviewed the patients History and Physical, chart, labs and discussed the procedure including the risks, benefits and alternatives for the proposed anesthesia with the patient or authorized representative who has indicated his/her understanding and acceptance.       Plan Discussed with: Anesthesiologist and CRNA  Anesthesia Plan Comments:         Anesthesia Quick Evaluation

## 2022-09-27 NOTE — Plan of Care (Signed)

## 2022-09-27 NOTE — Transfer of Care (Signed)
Immediate Anesthesia Transfer of Care Note  Patient: Peggy House  Procedure(s) Performed: ESOPHAGOGASTRODUODENOSCOPY (EGD) HEMOSTASIS CLIP PLACEMENT  Patient Location: Endoscopy Unit  Anesthesia Type:MAC  Level of Consciousness: drowsy  Airway & Oxygen Therapy: Patient Spontanous Breathing and Patient connected to nasal cannula oxygen  Post-op Assessment: Report given to RN and Post -op Vital signs reviewed and stable  Post vital signs: Reviewed and stable  Last Vitals:  Vitals Value Taken Time  BP 137/65 09/27/22 1107  Temp    Pulse 68 09/27/22 1108  Resp 17 09/27/22 1108  SpO2 95 % 09/27/22 1108  Vitals shown include unvalidated device data.  Last Pain:  Vitals:   09/27/22 0951  TempSrc: Temporal  PainSc: 8          Complications: No notable events documented.

## 2022-09-27 NOTE — Progress Notes (Signed)
Physical Therapy Evaluation Patient Details Name: ELLYN House MRN: [id number] DOB: 1960-05-31 Today's Date: 09/27/2022  History of Present Illness  Pt is a 62 y/o female presenting on 6/12 with nausea, diarrhea and abdominal discomfort. Admitted for acute on chronic anemia. S/P EGD 6/14. PMH includes: anxiety, arthritis, HTN, obesity, DM2.  Clinical Impression  Pt was seen for mobility on RW with help to stand but overall is better with mobility as she walks farther.  Pt is overall at baseline per her estimation, but has been struggling to stand and walk a bit at home.  Will recommend she have therapy at home, to work on deficits of LE strength, balance and control of powering up to stand.  Follow acutely for goals of PT.        Recommendations for follow up therapy are one component of a multi-disciplinary discharge planning process, led by the attending physician.  Recommendations may be updated based on patient status, additional functional criteria and insurance authorization.  Follow Up Recommendations       Assistance Recommended at Discharge Intermittent Supervision/Assistance  Patient can return home with the following  A little help with walking and/or transfers;A little help with bathing/dressing/bathroom;Assistance with cooking/housework;Assist for transportation;Help with stairs or ramp for entrance    Equipment Recommendations None recommended by PT  Recommendations for Other Services       Functional Status Assessment Patient has had a recent decline in their functional status and demonstrates the ability to make significant improvements in function in a reasonable and predictable amount of time.     Precautions / Restrictions Precautions Precautions: Fall Restrictions Weight Bearing Restrictions: No      Mobility  Bed Mobility                    Transfers                        Ambulation/Gait Ambulation/Gait assistance: Min  guard Gait Distance (Feet): 75 Feet Assistive device: Rolling walker (2 wheels) Gait Pattern/deviations: Step-through pattern, Wide base of support, Trunk flexed Gait velocity: reduced     General Gait Details: slow guarded pace due to stiffness in her joints  Stairs            Wheelchair Mobility    Modified Rankin (Stroke Patients Only)       Balance                                             Pertinent Vitals/Pain      Home Living Family/patient expects to be discharged to:: Private residence Living Arrangements: Children Available Help at Discharge: Family;Available 24 hours/day Type of Home: House Home Access: Stairs to enter;Elevator       Home Layout: One level Home Equipment: Agricultural consultant (2 wheels) Additional Comments: lives with son and granddaughter    Prior Function Prior Level of Function : Needs assist             Mobility Comments: using RW for mobility as needed ADLs Comments: pt reports needing some assist for ADls from sons girlfriend, used to have aide assist.  Family completes IADLs.  sponge bathing only.     Hand Dominance   Dominant Hand: Right    Extremity/Trunk Assessment   Upper Extremity Assessment Upper Extremity Assessment: Generalized weakness (limited shoulder ROM to ~  75* but reports baseline)    Lower Extremity Assessment Lower Extremity Assessment: Defer to PT evaluation       Communication   Communication: No difficulties  Cognition                                                General Comments General comments (skin integrity, edema, etc.): Pt is up to walk and to BR, able to maneuver with minor support of the effort.  Standing requires a bit of support but per pt this is baseline.    Exercises     Assessment/Plan    PT Assessment Patient needs continued PT services  PT Problem List Decreased strength;Decreased balance;Decreased coordination       PT  Treatment Interventions DME instruction;Gait training;Functional mobility training;Therapeutic activities;Therapeutic exercise;Balance training;Neuromuscular re-education;Patient/family education    PT Goals (Current goals can be found in the Care Plan section)  Acute Rehab PT Goals Patient Stated Goal: to get home with family assist PT Goal Formulation: With patient Time For Goal Achievement: 10/11/22 Potential to Achieve Goals: Good    Frequency Min 3X/week     Co-evaluation               AM-PAC PT "6 Clicks" Mobility  Outcome Measure Help needed turning from your back to your side while in a flat bed without using bedrails?: None Help needed moving from lying on your back to sitting on the side of a flat bed without using bedrails?: A Little Help needed moving to and from a bed to a chair (including a wheelchair)?: A Little Help needed standing up from a chair using your arms (e.g., wheelchair or bedside chair)?: A Lot Help needed to walk in hospital room?: A Little Help needed climbing 3-5 steps with a railing? : Total 6 Click Score: 16    End of Session Equipment Utilized During Treatment: Gait belt Activity Tolerance: Patient limited by fatigue;Treatment limited secondary to medical complications (Comment) Patient left: in bed;with call bell/phone within reach Nurse Communication: Mobility status PT Visit Diagnosis: Unsteadiness on feet (R26.81);Muscle weakness (generalized) (M62.81);Difficulty in walking, not elsewhere classified (R26.2)    Time: 1610-9604 PT Time Calculation (min) (ACUTE ONLY): 20 min   Charges:   PT Evaluation $PT Eval Moderate Complexity: 1 Mod         Ivar Drape 09/27/2022, 4:01 PM  Samul Dada, PT PhD Acute Rehab Dept. Number: [facility] [id number] and [facility] [phone]

## 2022-09-27 NOTE — Progress Notes (Signed)
PT Cancellation Note  Patient Details Name: ATASIA GLEAVES MRN: [id number] DOB: 1960-11-23   Cancelled Treatment:    Reason Eval/Treat Not Completed: Patient at procedure or test/unavailable.  Gone to procedure, reattempt as time and pt allow.   Ivar Drape 09/27/2022, 10:22 AM  Samul Dada, PT PhD Acute Rehab Dept. Number: [facility] [id number] and [facility] [phone]

## 2022-09-27 NOTE — Interval H&P Note (Signed)
History and Physical Interval Note:  09/27/2022 10:25 AM  Peggy House  has presented today for surgery, with the diagnosis of Melena, anemia.  The various methods of treatment have been discussed with the patient and family. After consideration of risks, benefits and other options for treatment, the patient has consented to  Procedure(s): ESOPHAGOGASTRODUODENOSCOPY (EGD) (N/A) as a surgical intervention.  The patient's history has been reviewed, patient examined, no change in status, stable for surgery.  I have reviewed the patient's chart and labs.  Questions were answered to the patient's satisfaction.     Lynann Bologna

## 2022-09-27 NOTE — H&P (View-Only) (Signed)
[facility] Consult  Referring Provider: No ref. provider found Primary Care Physician:  Nooruddin, Saad, MD Primary Gastroenterologist: [facility] (Dr. Magod)  Reason for Consultation: Melena, anemia  SUBJECTIVE:   HPI: Peggy House is a 61 y.o. female with past medical history significant for hereditary hemorrhagic telangiectasia, type 2 diabetes mellitus.  Presented to hospital on 09/25/2022 with chief complaint of diarrhea, nausea and abdominal discomfort.  She noted having significant epistaxis prior to admission.  She noted that abdominal discomfort began on 09/25/2022.  Located in left lower quadrant.  She noted having sick family member (granddaughter) with diarrheal illness.  She began to have melanic stools on 09/26/2022.  She was having some shortness of breath.  No chest pain.  Was having some fevers and chills.  Labs on presentation showed hemoglobin 6.5, has received 1 unit PRBC and hemoglobin is now 8.2.  WBC 10.6, platelet 181, iron stores within normal limits, abnormal urinalysis.  No imaging obtained.  EGD 05/27/2022 for hematemesis and melena (Dr. 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    Colonoscopy 03/02/2020 for hematochezia and melena (Dr. Buccini) showed AVM in mid ascending and cecum status post APC, sigmoid hyperplastic polyp status post cold snare polypectomy, rectosigmoid hyperplastic polyp x 6 removed via cold snare polypectomy, normal-appearing terminal ileum, no diverticulosis.  Video capsule endoscopy 03/06/2020 (Dr. Buccini) showed oozing in the stomach and proximal small bowel, multiple small vascular ectasia in the small bowel.  Past Medical History:  Diagnosis Date   Anxiety    Arthritis    knnes,back   GERD (gastroesophageal reflux disease)    Hereditary hemorrhagic telangiectasia (HCC)    History of swelling of feet    Hyperlipidemia    Hypertension    Major  depressive disorder, recurrent episode (HCC) 06/05/2015   Obesity    Snores    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   Past Surgical History:  Procedure Laterality Date   ABDOMINAL HYSTERECTOMY     CARPAL TUNNEL RELEASE  05/13/2011   Procedure: CARPAL TUNNEL RELEASE;  Surgeon: Justin William Chandler, MD;  Location: [facility];  Service: Orthopedics;  Laterality: Left;   COLONOSCOPY N/A 03/02/2020   Procedure: COLONOSCOPY;  Surgeon: Buccini, Robert, MD;  Location: [facility];  Service: Endoscopy;  Laterality: N/A;   COLONOSCOPY WITH PROPOFOL N/A 04/28/2014   Procedure: COLONOSCOPY WITH PROPOFOL;  Surgeon: Robert Buccini V, MD;  Location: [facility];  Service: Endoscopy;  Laterality: N/A;   DG TOES*L*  2/10   rt   DILATION AND CURETTAGE OF UTERUS     ENTEROSCOPY N/A 10/17/2017   Procedure: ENTEROSCOPY;  Surgeon: Brahmbhatt, Parag, MD;  Location: [facility];  Service: Gastroenterology;  Laterality: N/A;   ESOPHAGOGASTRODUODENOSCOPY N/A 04/10/2014   Procedure: ESOPHAGOGASTRODUODENOSCOPY (EGD);  Surgeon: Vincent C. Schooler, MD;  Location: [facility];  Service: Endoscopy;  Laterality: N/A;   ESOPHAGOGASTRODUODENOSCOPY N/A 05/10/2017   Procedure: ESOPHAGOGASTRODUODENOSCOPY (EGD);  Surgeon: Buccini, Robert, MD;  Location: [facility];  Service: Endoscopy;  Laterality: N/A;   ESOPHAGOGASTRODUODENOSCOPY N/A 09/22/2017   Procedure: ESOPHAGOGASTRODUODENOSCOPY (EGD);  Surgeon: Magod, Marc, MD;  Location: [facility];  Service: Endoscopy;  Laterality: N/A;  bedside   ESOPHAGOGASTRODUODENOSCOPY N/A 03/02/2020   Procedure: ESOPHAGOGASTRODUODENOSCOPY (EGD);  Surgeon: Buccini, Robert, MD;  Location: [facility];  Service: Endoscopy;  Laterality: N/A;   ESOPHAGOGASTRODUODENOSCOPY N/A 11/03/2020   Procedure: ESOPHAGOGASTRODUODENOSCOPY (EGD);  Surgeon: Schooler, Vincent, MD;    [facility]: [facility];  Service: Endoscopy;  Laterality: N/A;    ESOPHAGOGASTRODUODENOSCOPY N/A 04/12/2022   Procedure: ESOPHAGOGASTRODUODENOSCOPY (EGD);  Surgeon: Magod, Marc, MD;  Location: [facility];  Service: Gastroenterology;  Laterality: N/A;   ESOPHAGOGASTRODUODENOSCOPY N/A 05/27/2022   Procedure: ESOPHAGOGASTRODUODENOSCOPY (EGD);  Surgeon: Outlaw, William, MD;  Location: [facility];  Service: Gastroenterology;  Laterality: N/A;   ESOPHAGOGASTRODUODENOSCOPY (EGD) WITH PROPOFOL N/A 04/27/2014   Procedure: ESOPHAGOGASTRODUODENOSCOPY (EGD) WITH PROPOFOL;  Surgeon: Robert Buccini V, MD;  [facility]: [facility];  Service: Endoscopy;  Laterality: N/A;  possible apc   ESOPHAGOGASTRODUODENOSCOPY (EGD) WITH PROPOFOL N/A 09/30/2017   Procedure: ESOPHAGOGASTRODUODENOSCOPY (EGD) WITH PROPOFOL;  Surgeon: Karki, Arya, MD;  [facility]: [facility];  Service: Gastroenterology;  Laterality: N/A;   ESOPHAGOGASTRODUODENOSCOPY (EGD) WITH PROPOFOL N/A 10/01/2017   Procedure: ESOPHAGOGASTRODUODENOSCOPY (EGD) WITH PROPOFOL;  Surgeon: Karki, Arya, MD;  [facility]: [facility];  Service: Gastroenterology;  Laterality: N/A;   ESOPHAGOGASTRODUODENOSCOPY (EGD) WITH PROPOFOL N/A 10/08/2017   Procedure: ESOPHAGOGASTRODUODENOSCOPY (EGD) WITH PROPOFOL;  Surgeon: Brahmbhatt, Parag, MD;  [facility]: [facility];  Service: Gastroenterology;  Laterality: N/A;   ESOPHAGOGASTRODUODENOSCOPY (EGD) WITH PROPOFOL N/A 10/17/2017   Procedure: ESOPHAGOGASTRODUODENOSCOPY (EGD) WITH PROPOFOL;  Surgeon: Brahmbhatt, Parag, MD;  [facility]: [facility];  Service: Gastroenterology;  Laterality: N/A;   ESOPHAGOGASTRODUODENOSCOPY (EGD) WITH PROPOFOL N/A 10/19/2017   Procedure: ESOPHAGOGASTRODUODENOSCOPY (EGD) WITH PROPOFOL;  Surgeon: Brahmbhatt, Parag, MD;  [facility]: [facility];  Service: Gastroenterology;  Laterality: N/A;   ESOPHAGOGASTRODUODENOSCOPY (EGD) WITH PROPOFOL N/A 12/04/2018   Procedure: ESOPHAGOGASTRODUODENOSCOPY (EGD) WITH PROPOFOL;  Surgeon: Schooler, Vincent, MD;  Location: [facility];  Service:  Endoscopy;  Laterality: N/A;   GIVENS CAPSULE STUDY N/A 10/02/2017   Procedure: GIVENS CAPSULE STUDY;  Surgeon: Karki, Arya, MD;  [facility]: [facility];  Service: Gastroenterology;  Laterality: N/A;   GIVENS CAPSULE STUDY N/A 10/08/2017   Procedure: GIVENS CAPSULE STUDY;  Surgeon: Brahmbhatt, Parag, MD;  [facility]: [facility];  Service: Gastroenterology;  Laterality: N/A;  endoscopic placement of capsule   GIVENS CAPSULE STUDY N/A 03/02/2020   Procedure: GIVENS CAPSULE STUDY;  Surgeon: Buccini, Robert, MD;  Location: [facility];  Service: Endoscopy;  Laterality: N/A;   HEMOSTASIS CLIP PLACEMENT  12/04/2018   Procedure: HEMOSTASIS CLIP PLACEMENT;  Surgeon: Schooler, Vincent, MD;  Location: [facility];  Service: Endoscopy;;   HEMOSTASIS CLIP PLACEMENT  05/27/2022   Procedure: HEMOSTASIS CLIP PLACEMENT;  Surgeon: Outlaw, William, MD;  Location: [facility];  Service: Gastroenterology;;   HEMOSTASIS CONTROL  05/27/2022   Procedure: HEMOSTASIS CONTROL;  Surgeon: Outlaw, William, MD;  Location: [facility];  Service: Gastroenterology;;   HOT HEMOSTASIS N/A 04/27/2014   Procedure: HOT HEMOSTASIS (ARGON PLASMA COAGULATION/BICAP);  Surgeon: Robert Buccini V, MD;  [facility]: [facility];  Service: Endoscopy;  Laterality: N/A;   HOT HEMOSTASIS N/A 09/30/2017   Procedure: HOT HEMOSTASIS (ARGON PLASMA COAGULATION/BICAP);  Surgeon: Karki, Arya, MD;  Location: [facility];  Service: Gastroenterology;  Laterality: N/A;   HOT HEMOSTASIS N/A 10/01/2017   Procedure: HOT HEMOSTASIS (ARGON PLASMA COAGULATION/BICAP);  Surgeon: Karki, Arya, MD;  [facility]: [facility];  Service: Gastroenterology;  Laterality: N/A;   HOT HEMOSTASIS N/A 10/17/2017   Procedure: HOT HEMOSTASIS (ARGON PLASMA COAGULATION/BICAP);  Surgeon: Brahmbhatt, Parag, MD;  Location: [facility];  Service: Gastroenterology;  Laterality: N/A;   HOT HEMOSTASIS N/A 10/19/2017   Procedure: HOT HEMOSTASIS (ARGON PLASMA COAGULATION/BICAP);  Surgeon:  Brahmbhatt, Parag, MD;  Location: [facility];  Service: Gastroenterology;  Laterality: N/A;   HOT HEMOSTASIS N/A 03/02/2020   Procedure: HOT HEMOSTASIS (ARGON PLASMA COAGULATION/BICAP);  Surgeon:   Buccini, Robert, MD;  Location: [facility];  Service: Endoscopy;  Laterality: N/A;   HOT HEMOSTASIS N/A 04/12/2022   Procedure: HOT HEMOSTASIS (ARGON PLASMA COAGULATION/BICAP);  Surgeon: Magod, Marc, MD;  Location: [facility];  Service: Gastroenterology;  Laterality: N/A;   IR IMAGING GUIDED PORT INSERTION  07/08/2018   L shoulder Surgery  2011   POLYPECTOMY  03/02/2020   Procedure: POLYPECTOMY;  Surgeon: Buccini, Robert, MD;  Location: [facility];  Service: Endoscopy;;   SCLEROTHERAPY  11/03/2020   Procedure: SCLEROTHERAPY;  Surgeon: Schooler, Vincent, MD;  Location: [facility];  Service: Endoscopy;;   SUBMUCOSAL INJECTION  09/22/2017   Procedure: SUBMUCOSAL INJECTION;  Surgeon: Magod, Marc, MD;  Location: [facility];  Service: Endoscopy;;   SUBMUCOSAL INJECTION  12/04/2018   Procedure: SUBMUCOSAL INJECTION;  Surgeon: Schooler, Vincent, MD;  Location: [facility];  Service: Endoscopy;;   Prior to Admission medications   Medication Sig Start Date End Date Taking? Authorizing Provider  ALPRAZolam (XANAX) 0.5 MG tablet TAKE 1 TABLET BY MOUTH AT BEDTIME AS NEEDED FOR ANXIETY OR SLEEP Patient taking differently: Take 0.5 mg by mouth at bedtime as needed for anxiety or sleep. 09/11/22  Yes Feng, Yan, MD  Aminocaproic Acid 1000 MG TABS Take 1 tablet (1,000 mg total) by mouth in the morning and at bedtime. 07/23/22  Yes Feng, Yan, MD  amLODipine (NORVASC) 10 MG tablet TAKE 1 TABLET(10 MG) BY MOUTH DAILY Patient taking differently: Take 10 mg by mouth daily. 06/12/22  Yes Burton, Lacie K, NP  citalopram (CELEXA) 20 MG tablet Take 1 tablet (20 mg total) by mouth daily. 06/20/22 06/20/23 Yes Nooruddin, Saad, MD  lisinopril (ZESTRIL) 20 MG tablet Take 1 tablet (20 mg total) by mouth daily. 06/20/22 06/20/23 Yes  Nooruddin, Saad, MD  metFORMIN (GLUCOPHAGE) 500 MG tablet TAKE 1 TABLET(500 MG) BY MOUTH TWICE DAILY WITH A MEAL Patient taking differently: Take 500 mg by mouth 2 (two) times daily with a meal. 11/21/21  Yes Dean, Emily, DO  ondansetron (ZOFRAN) 4 MG tablet Take 1 tablet (4 mg total) by mouth every 8 (eight) hours as needed for nausea or vomiting. 04/02/22  Yes Feng, Yan, MD  pantoprazole (PROTONIX) 40 MG tablet Take 1 tablet (40 mg total) by mouth 2 (two) times daily. 09/19/22  Yes Feng, Yan, MD  potassium chloride (KLOR-CON M) 10 MEQ tablet Take 1 tablet (10 mEq total) by mouth daily. 07/29/22  Yes Feng, Yan, MD  PROAIR HFA 108 (90 Base) MCG/ACT inhaler INHALE 1 TO 2 PUFFS INTO THE LUNGS EVERY 6 HOURS AS NEEDED FOR WHEEZING OR SHORTNESS OF BREATH Patient taking differently: Inhale 1-2 puffs into the lungs every 6 (six) hours as needed for wheezing or shortness of breath. 11/10/20  Yes Feng, Yan, MD  traZODone (DESYREL) 100 MG tablet TAKE 1 TABLET(100 MG) BY MOUTH AT BEDTIME Patient taking differently: Take 100 mg by mouth at bedtime. TAKE 1 TABLET(100 MG) BY MOUTH AT BEDTIME 07/23/22  Yes Feng, Yan, MD  TYLENOL PM EXTRA STRENGTH 500-25 MG TABS tablet Take 2 tablets by mouth at bedtime as needed (for sleep).   Yes [provider]  Accu-Chek Softclix Lancets lancets Use to check blood sugar before breakfast and before dinner while on steroids Patient taking differently: 1 each by Other route See admin instructions. Use to check blood sugar before breakfast and before dinner while on steroids 09/27/19   Krienke, Marissa M, MD  glucose blood (ACCU-CHEK GUIDE) test strip Check blood sugar 2 times per day while on   steroids before breakfast and dinner Patient taking differently: 1 each by Other route See admin instructions. Check blood sugar 2 times per day while on steroids before breakfast and dinner 09/27/19   Krienke, Marissa M, MD  lidocaine-prilocaine (EMLA) cream Apply 1 application topically as  needed. Patient not taking: Reported on 09/26/2022 07/17/18   Feng, Yan, MD  nicotine (NICODERM CQ - DOSED IN MG/24 HOURS) 14 mg/24hr patch Place 1 patch (14 mg total) onto the skin daily. Patient not taking: Reported on 09/26/2022 06/20/22 06/20/23  Nooruddin, Saad, MD   Current Facility-Administered Medications  Medication Dose Route Frequency Provider Last Rate Last Admin   acetaminophen (TYLENOL) tablet 1,000 mg  1,000 mg Oral Q6H PRN McLendon, Michael, MD   1,000 mg at 09/26/22 0855   Or   acetaminophen (TYLENOL) suppository 650 mg  650 mg Rectal Q6H PRN McLendon, Michael, MD       ALPRAZolam (XANAX) tablet 0.5 mg  0.5 mg Oral QHS PRN McLendon, Michael, MD   0.5 mg at 09/26/22 2154   citalopram (CELEXA) tablet 20 mg  20 mg Oral Daily McLendon, Michael, MD   20 mg at 09/27/22 0840   lisinopril (ZESTRIL) tablet 20 mg  20 mg Oral Daily Zheng, Michael, DO   20 mg at 09/27/22 0840   nicotine (NICODERM CQ - dosed in mg/24 hours) patch 14 mg  14 mg Transdermal Daily McLendon, Michael, MD   14 mg at 09/27/22 0841   ondansetron (ZOFRAN) tablet 4 mg  4 mg Oral Q6H PRN McLendon, Michael, MD       Or   ondansetron (ZOFRAN) injection 4 mg  4 mg Intravenous Q6H PRN McLendon, Michael, MD   4 mg at 09/26/22 1524   pantoprazole (PROTONIX) EC tablet 40 mg  40 mg Oral BID Zheng, Michael, DO   40 mg at 09/27/22 0840   traZODone (DESYREL) tablet 100 mg  100 mg Oral QHS McLendon, Michael, MD   100 mg at 09/26/22 2154   Facility-Administered Medications Ordered in Other Encounters  Medication Dose Route Frequency Provider Last Rate Last Admin   diphenhydrAMINE (BENADRYL) 25 mg capsule            diphenhydrAMINE (BENADRYL) 25 mg capsule            phenylephrine (NEO-SYNEPHRINE) 1 % nasal drops 2 drop  2 drop Left Nare Once Feng, Yan, MD       Allergies as of 09/25/2022 - Review Complete 09/25/2022  Allergen Reaction Noted   Feraheme [ferumoxytol] Other (See Comments) 10/09/2017   Nsaids Other (See Comments)  04/25/2014   Tomato Hives 01/01/2012   Iron (ferrous sulfate) [ferrous sulfate er] Other (See Comments) 03/07/2020   Wasp venom Swelling 10/06/2017   Family History  Problem Relation Age of Onset   Cancer Mother        Ovarian   Ovarian cancer Mother    Diabetes Mother    Kidney disease Mother    Bleeding Disorder Mother    Diabetes type II Sister    Bleeding Disorder Sister    Diabetes Sister    Colon cancer Maternal Grandfather    Crohn's disease Maternal Grandfather    Stomach cancer Maternal Grandmother    Kidney disease Son    Bleeding Disorder Son    Dysmenorrhea Neg Hx    Social History   Socioeconomic History   Marital status: Widowed    Spouse name: Not on file   Number of children: 3   Years of   education: Not on file   Highest education level: Not on file  Occupational History   Not on file  Tobacco Use   Smoking status: Every Day    Packs/day: 0.50    Years: 30.00    Additional pack years: 0.00    Total pack years: 15.00    Types: Cigarettes   Smokeless tobacco: Former    Types: Snuff    Quit date: 1981   Tobacco comments:    1/2 PPD  Vaping Use   Vaping Use: Never used  Substance and Sexual Activity   Alcohol use: No    Alcohol/week: 0.0 standard drinks of alcohol   Drug use: No    Comment: last cocaine-2010   Sexual activity: Yes    Birth control/protection: Surgical    Comment: Hysterectomy  Other Topics Concern   Not on file  Social History Narrative   Not on file   Social Determinants of Health   Financial Resource Strain: Not on file  Food Insecurity: Food Insecurity Present (09/26/2022)   Hunger Vital Sign    Worried About Running Out of Food in the Last Year: Sometimes true    Ran Out of Food in the Last Year: Sometimes true  Transportation Needs: No Transportation Needs (09/26/2022)   PRAPARE - Transportation    Lack of Transportation (Medical): No    Lack of Transportation (Non-Medical): No  Physical Activity: Not on file   Stress: Not on file  Social Connections: Not on file  Intimate Partner Violence: Not At Risk (09/26/2022)   Humiliation, Afraid, Rape, and Kick questionnaire    Fear of Current or Ex-Partner: No    Emotionally Abused: No    Physically Abused: No    Sexually Abused: No   Review of Systems:  Review of Systems  Constitutional:  Positive for chills and fever.  Respiratory:  Positive for shortness of breath.   Cardiovascular:  Negative for chest pain.  Gastrointestinal:  Positive for abdominal pain, diarrhea, melena and nausea. Negative for vomiting.    OBJECTIVE:   Temp:  [98 F (36.7 C)-98.6 F (37 C)] 98.2 F (36.8 C) (06/14 0744) Pulse Rate:  [71-82] 71 (06/14 0744) Resp:  [14-18] 18 (06/14 0744) BP: (129-156)/(51-83) 143/71 (06/14 0744) SpO2:  [95 %-100 %] 95 % (06/14 0744) Last BM Date : 09/26/22 Physical Exam Constitutional:      General: She is not in acute distress.    Appearance: She is not ill-appearing, toxic-appearing or diaphoretic.  Cardiovascular:     Rate and Rhythm: Normal rate. Rhythm irregular.  Pulmonary:     Effort: No respiratory distress.     Breath sounds: Normal breath sounds.  Abdominal:     General: Bowel sounds are normal. There is no distension.     Palpations: Abdomen is soft.     Tenderness: There is abdominal tenderness (LLQ). There is no guarding.  Musculoskeletal:     Right lower leg: No edema.     Left lower leg: No edema.  Skin:    General: Skin is warm and dry.  Neurological:     Mental Status: She is alert.     Labs: Recent Labs    09/25/22 2134 09/25/22 2203 09/26/22 0808 09/26/22 1626 09/27/22 0014  WBC 14.7*  --  10.6*  --  11.3*  HGB 7.1*   < > 6.5* 8.3* 8.2*  HCT 24.3*   < > 22.3* 26.3* 26.8*  PLT 184  --  164  --  181   < > =   values in this interval not displayed.   BMET Recent Labs    09/25/22 2134 09/25/22 2203 09/26/22 0808  NA 137 141 141  K 3.7 3.7 3.8  CL 110 109 111  CO2 21*  --  24  GLUCOSE 105*  100* 96  BUN 10 12 15  CREATININE 0.98 0.90 0.79  CALCIUM 8.0*  --  8.3*   LFT Recent Labs    09/25/22 2134  PROT 5.4*  ALBUMIN 2.7*  AST 25  ALT 10  ALKPHOS 56  BILITOT 0.4   PT/INR No results for input(s): "LABPROT", "INR" in the last 72 hours.  Diagnostic imaging: CT ANGIO GI BLEED  Result Date: 09/25/2022 CLINICAL DATA:  Abdominal pain, acute, nonlocalized.  Nausea EXAM: CTA ABDOMEN AND PELVIS WITHOUT AND WITH CONTRAST TECHNIQUE: Multidetector CT imaging of the abdomen and pelvis was performed using the standard protocol during bolus administration of intravenous contrast. Multiplanar reconstructed images and MIPs were obtained and reviewed to evaluate the vascular anatomy. RADIATION DOSE REDUCTION: This exam was performed according to the departmental dose-optimization program which includes automated exposure control, adjustment of the mA and/or kV according to patient size and/or use of iterative reconstruction technique. CONTRAST:  100mL OMNIPAQUE IOHEXOL 350 MG/ML SOLN COMPARISON:  None Available. FINDINGS: VASCULAR Aorta: Scattered infrarenal aortic atherosclerosis. No aneurysm or dissection. No significant stenosis. Celiac: Patent without evidence of aneurysm, dissection, vasculitis or significant stenosis. SMA: Patent without evidence of aneurysm, dissection, vasculitis or significant stenosis. Renals: Both renal arteries are patent without evidence of aneurysm, dissection, vasculitis, fibromuscular dysplasia or significant stenosis. IMA: Patent without evidence of aneurysm, dissection, vasculitis or significant stenosis. Inflow: Moderate atherosclerotic calcifications. No aneurysm, dissection, or significant stenosis. Proximal Outflow: Scattered calcifications. No aneurysm, dissection or significant stenosis. Veins: No obvious venous abnormality within the limitations of this arterial phase study. Review of the MIP images confirms the above findings. NON-VASCULAR Lower chest: No  acute abnormality. Linear scarring or chronic atelectasis in the lung bases, stable. Hepatobiliary: Small calcification within the gallbladder, stable. Gallbladder is nondistended. No biliary ductal dilatation. No focal hepatic abnormality. Pancreas: No focal abnormality or ductal dilatation. Spleen: No focal abnormality.  Normal size. Adrenals/Urinary Tract: Small cyst in the upper pole of the right kidney, stable. No suspicious renal or adrenal abnormality. No follow-up imaging recommended. No hydronephrosis. Urinary bladder unremarkable. Stomach/Bowel: Surgical clips within and around the stomach. Stomach is moderately distended. No active extravasation of contrast within the bowel to localize GI bleed. Small and large bowel decompressed, unremarkable. No bowel obstruction. Lymphatic: No adenopathy Reproductive: Prior hysterectomy.  No adnexal masses. Other: No free fluid or free air. Musculoskeletal: No acute bony abnormality. IMPRESSION: VASCULAR Aortoiliac atherosclerosis.  No aneurysm or dissection. No active contrast extravasation to localize GI bleed. NON-VASCULAR Moderate distention of the stomach with debris. Cholelithiasis.  No CT evidence of acute cholecystitis. No acute findings. Electronically Signed   By: Kevin  Dover M.D.   On: 09/25/2022 23:25    IMPRESSION: Melena Acute blood loss anemia Hereditary hemorrhagic telangiectasia History epistaxis History gastric and small bowel arteriovenous malformation  -EGD February 2024 required gastric cardia AVM treatment  -Colonoscopy November 2021 required ascending and cecum AVM treatment Type 2 diabetes mellitus Presumed viral gastroenteritis  PLAN: -Recommend EGD to further evaluate melena, acute blood loss anemia and history of AVMs -Discussed procedure in detail with patient, including benefits, alternatives, risks including bleeding/infection/perforation/missed lesion/anesthesia, she verbalized understanding and elected to  proceed -Maintain n.p.o. today -Continue PPI therapy -Maintenance IV fluids -Further recommendations   to follow pending procedure, timing to be determined    LOS: 1 day   Ailea Rhatigan, DO [facility]    

## 2022-09-27 NOTE — Consult Note (Signed)
[facility] Gastroenterology Consult  Referring Provider: No ref. provider found Primary Care Physician:  Olegario Messier, MD Primary Gastroenterologist: [facility] (Dr. Ewing Schlein)  Reason for Consultation: Melena, anemia  SUBJECTIVE:   HPI: Peggy House is a 62 y.o. female with past medical history significant for hereditary hemorrhagic telangiectasia, type 2 diabetes mellitus.  Presented to hospital on 09/25/2022 with chief complaint of diarrhea, nausea and abdominal discomfort.  She noted having significant epistaxis prior to admission.  She noted that abdominal discomfort began on 09/25/2022.  Located in left lower quadrant.  She noted having sick family member (granddaughter) with diarrheal illness.  She began to have melanic stools on 09/26/2022.  She was having some shortness of breath.  No chest pain.  Was having some fevers and chills.  Labs on presentation showed hemoglobin 6.5, has received 1 unit PRBC and hemoglobin is now 8.2.  WBC 10.6, platelet 181, iron stores within normal limits, abnormal urinalysis.  No imaging obtained.  EGD 05/27/2022 for hematemesis and melena (Dr. 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    Colonoscopy 03/02/2020 for hematochezia and melena (Dr. Matthias Hughs) showed AVM in mid ascending and cecum status post APC, sigmoid hyperplastic polyp status post cold snare polypectomy, rectosigmoid hyperplastic polyp x 6 removed via cold snare polypectomy, normal-appearing terminal ileum, no diverticulosis.  Video capsule endoscopy 03/06/2020 (Dr. Matthias Hughs) showed oozing in the stomach and proximal small bowel, multiple small vascular ectasia in the small bowel.  Past Medical History:  Diagnosis Date   Anxiety    Arthritis    knnes,back   GERD (gastroesophageal reflux disease)    Hereditary hemorrhagic telangiectasia (HCC)    History of swelling of feet    Hyperlipidemia    Hypertension    Major  depressive disorder, recurrent episode (HCC) 06/05/2015   Obesity    Snores    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   Past Surgical History:  Procedure Laterality Date   ABDOMINAL HYSTERECTOMY     CARPAL TUNNEL RELEASE  05/13/2011   Procedure: CARPAL TUNNEL RELEASE;  Surgeon: Mable Paris, MD;  Location: [facility];  Service: Orthopedics;  Laterality: Left;   COLONOSCOPY N/A 03/02/2020   Procedure: COLONOSCOPY;  Surgeon: Bernette Redbird, MD;  Location: [facility] ENDOSCOPY;  Service: Endoscopy;  Laterality: N/A;   COLONOSCOPY WITH PROPOFOL N/A 04/28/2014   Procedure: COLONOSCOPY WITH PROPOFOL;  Surgeon: Florencia Reasons, MD;  Location: [facility] ENDOSCOPY;  Service: Endoscopy;  Laterality: N/A;   DG TOES*L*  2/10   rt   DILATION AND CURETTAGE OF UTERUS     ENTEROSCOPY N/A 10/17/2017   Procedure: ENTEROSCOPY;  Surgeon: Kathi Der, MD;  Location: [facility] ENDOSCOPY;  Service: Gastroenterology;  Laterality: N/A;   ESOPHAGOGASTRODUODENOSCOPY N/A 04/10/2014   Procedure: ESOPHAGOGASTRODUODENOSCOPY (EGD);  Surgeon: Shirley Friar, MD;  Location: [facility] ENDOSCOPY;  Service: Endoscopy;  Laterality: N/A;   ESOPHAGOGASTRODUODENOSCOPY N/A 05/10/2017   Procedure: ESOPHAGOGASTRODUODENOSCOPY (EGD);  Surgeon: Bernette Redbird, MD;  Location: [facility] ENDOSCOPY;  Service: Endoscopy;  Laterality: N/A;   ESOPHAGOGASTRODUODENOSCOPY N/A 09/22/2017   Procedure: ESOPHAGOGASTRODUODENOSCOPY (EGD);  Surgeon: Vida Rigger, MD;  Location: [facility] ENDOSCOPY;  Service: Endoscopy;  Laterality: N/A;  bedside   ESOPHAGOGASTRODUODENOSCOPY N/A 03/02/2020   Procedure: ESOPHAGOGASTRODUODENOSCOPY (EGD);  Surgeon: Bernette Redbird, MD;  Location: [facility] ENDOSCOPY;  Service: Endoscopy;  Laterality: N/A;   ESOPHAGOGASTRODUODENOSCOPY N/A 11/03/2020   Procedure: ESOPHAGOGASTRODUODENOSCOPY (EGD);  Surgeon: Charlott Rakes, MD;  Location: [facility] ENDOSCOPY;  Service: Endoscopy;  Laterality: N/A;    ESOPHAGOGASTRODUODENOSCOPY N/A 04/12/2022   Procedure: ESOPHAGOGASTRODUODENOSCOPY (EGD);  Surgeon: Vida Rigger, MD;  Location: [facility] ENDOSCOPY;  Service: Gastroenterology;  Laterality: N/A;   ESOPHAGOGASTRODUODENOSCOPY N/A 05/27/2022   Procedure: ESOPHAGOGASTRODUODENOSCOPY (EGD);  Surgeon: Willis Modena, MD;  Location: [facility] ENDOSCOPY;  Service: Gastroenterology;  Laterality: N/A;   ESOPHAGOGASTRODUODENOSCOPY (EGD) WITH PROPOFOL N/A 04/27/2014   Procedure: ESOPHAGOGASTRODUODENOSCOPY (EGD) WITH PROPOFOL;  Surgeon: Florencia Reasons, MD;  Location: [facility] ENDOSCOPY;  Service: Endoscopy;  Laterality: N/A;  possible apc   ESOPHAGOGASTRODUODENOSCOPY (EGD) WITH PROPOFOL N/A 09/30/2017   Procedure: ESOPHAGOGASTRODUODENOSCOPY (EGD) WITH PROPOFOL;  Surgeon: Kerin Salen, MD;  Location: [facility] ENDOSCOPY;  Service: Gastroenterology;  Laterality: N/A;   ESOPHAGOGASTRODUODENOSCOPY (EGD) WITH PROPOFOL N/A 10/01/2017   Procedure: ESOPHAGOGASTRODUODENOSCOPY (EGD) WITH PROPOFOL;  Surgeon: Kerin Salen, MD;  Location: [facility] ENDOSCOPY;  Service: Gastroenterology;  Laterality: N/A;   ESOPHAGOGASTRODUODENOSCOPY (EGD) WITH PROPOFOL N/A 10/08/2017   Procedure: ESOPHAGOGASTRODUODENOSCOPY (EGD) WITH PROPOFOL;  Surgeon: Kathi Der, MD;  Location: [facility] ENDOSCOPY;  Service: Gastroenterology;  Laterality: N/A;   ESOPHAGOGASTRODUODENOSCOPY (EGD) WITH PROPOFOL N/A 10/17/2017   Procedure: ESOPHAGOGASTRODUODENOSCOPY (EGD) WITH PROPOFOL;  Surgeon: Kathi Der, MD;  Location: [facility] ENDOSCOPY;  Service: Gastroenterology;  Laterality: N/A;   ESOPHAGOGASTRODUODENOSCOPY (EGD) WITH PROPOFOL N/A 10/19/2017   Procedure: ESOPHAGOGASTRODUODENOSCOPY (EGD) WITH PROPOFOL;  Surgeon: Kathi Der, MD;  Location: [facility] ENDOSCOPY;  Service: Gastroenterology;  Laterality: N/A;   ESOPHAGOGASTRODUODENOSCOPY (EGD) WITH PROPOFOL N/A 12/04/2018   Procedure: ESOPHAGOGASTRODUODENOSCOPY (EGD) WITH PROPOFOL;  Surgeon: Charlott Rakes, MD;  Location: [facility] ENDOSCOPY;  Service:  Endoscopy;  Laterality: N/A;   GIVENS CAPSULE STUDY N/A 10/02/2017   Procedure: GIVENS CAPSULE STUDY;  Surgeon: Kerin Salen, MD;  Location: [facility] ENDOSCOPY;  Service: Gastroenterology;  Laterality: N/A;   GIVENS CAPSULE STUDY N/A 10/08/2017   Procedure: GIVENS CAPSULE STUDY;  Surgeon: Kathi Der, MD;  Location: [facility] ENDOSCOPY;  Service: Gastroenterology;  Laterality: N/A;  endoscopic placement of capsule   GIVENS CAPSULE STUDY N/A 03/02/2020   Procedure: GIVENS CAPSULE STUDY;  Surgeon: Bernette Redbird, MD;  Location: [facility] ENDOSCOPY;  Service: Endoscopy;  Laterality: N/A;   HEMOSTASIS CLIP PLACEMENT  12/04/2018   Procedure: HEMOSTASIS CLIP PLACEMENT;  Surgeon: Charlott Rakes, MD;  Location: [facility] ENDOSCOPY;  Service: Endoscopy;;   HEMOSTASIS CLIP PLACEMENT  05/27/2022   Procedure: HEMOSTASIS CLIP PLACEMENT;  Surgeon: Willis Modena, MD;  Location: [facility] ENDOSCOPY;  Service: Gastroenterology;;   HEMOSTASIS CONTROL  05/27/2022   Procedure: HEMOSTASIS CONTROL;  Surgeon: Willis Modena, MD;  Location: [facility]OSCOPY;  Service: Gastroenterology;;   HOT HEMOSTASIS N/A 04/27/2014   Procedure: HOT HEMOSTASIS (ARGON PLASMA COAGULATION/BICAP);  Surgeon: Florencia Reasons, MD;  Location: [facility] ENDOSCOPY;  Service: Endoscopy;  Laterality: N/A;   HOT HEMOSTASIS N/A 09/30/2017   Procedure: HOT HEMOSTASIS (ARGON PLASMA COAGULATION/BICAP);  Surgeon: Kerin Salen, MD;  Location: [facility] ENDOSCOPY;  Service: Gastroenterology;  Laterality: N/A;   HOT HEMOSTASIS N/A 10/01/2017   Procedure: HOT HEMOSTASIS (ARGON PLASMA COAGULATION/BICAP);  Surgeon: Kerin Salen, MD;  Location: [facility] ENDOSCOPY;  Service: Gastroenterology;  Laterality: N/A;   HOT HEMOSTASIS N/A 10/17/2017   Procedure: HOT HEMOSTASIS (ARGON PLASMA COAGULATION/BICAP);  Surgeon: Kathi Der, MD;  Location: [facility] ENDOSCOPY;  Service: Gastroenterology;  Laterality: N/A;   HOT HEMOSTASIS N/A 10/19/2017   Procedure: HOT HEMOSTASIS (ARGON PLASMA COAGULATION/BICAP);  Surgeon:  Kathi Der, MD;  Location: [facility] ENDOSCOPY;  Service: Gastroenterology;  Laterality: N/A;   HOT HEMOSTASIS N/A 03/02/2020   Procedure: HOT HEMOSTASIS (ARGON PLASMA COAGULATION/BICAP);  Surgeon:  Bernette Redbird, MD;  Location: [facility] ENDOSCOPY;  Service: Endoscopy;  Laterality: N/A;   HOT HEMOSTASIS N/A 04/12/2022   Procedure: HOT HEMOSTASIS (ARGON PLASMA COAGULATION/BICAP);  Surgeon: Vida Rigger, MD;  Location: [facility] ENDOSCOPY;  Service: Gastroenterology;  Laterality: N/A;   IR IMAGING GUIDED PORT INSERTION  07/08/2018   L shoulder Surgery  2011   POLYPECTOMY  03/02/2020   Procedure: POLYPECTOMY;  Surgeon: Bernette Redbird, MD;  Location: [facility] ENDOSCOPY;  Service: Endoscopy;;   SCLEROTHERAPY  11/03/2020   Procedure: Susa Day;  Surgeon: Charlott Rakes, MD;  Location: [facility] ENDOSCOPY;  Service: Endoscopy;;   SUBMUCOSAL INJECTION  09/22/2017   Procedure: SUBMUCOSAL INJECTION;  Surgeon: Vida Rigger, MD;  Location: [facility] ENDOSCOPY;  Service: Endoscopy;;   SUBMUCOSAL INJECTION  12/04/2018   Procedure: SUBMUCOSAL INJECTION;  Surgeon: Charlott Rakes, MD;  Location: [facility] ENDOSCOPY;  Service: Endoscopy;;   Prior to Admission medications   Medication Sig Start Date End Date Taking? Authorizing Provider  ALPRAZolam (XANAX) 0.5 MG tablet TAKE 1 TABLET BY MOUTH AT BEDTIME AS NEEDED FOR ANXIETY OR SLEEP Patient taking differently: Take 0.5 mg by mouth at bedtime as needed for anxiety or sleep. 09/11/22  Yes Malachy Mood, MD  Aminocaproic Acid 1000 MG TABS Take 1 tablet (1,000 mg total) by mouth in the morning and at bedtime. 07/23/22  Yes Malachy Mood, MD  amLODipine (NORVASC) 10 MG tablet TAKE 1 TABLET(10 MG) BY MOUTH DAILY Patient taking differently: Take 10 mg by mouth daily. 06/12/22  Yes Pollyann Samples, NP  citalopram (CELEXA) 20 MG tablet Take 1 tablet (20 mg total) by mouth daily. 06/20/22 06/20/23 Yes Nooruddin, Jason Fila, MD  lisinopril (ZESTRIL) 20 MG tablet Take 1 tablet (20 mg total) by mouth daily. 06/20/22 06/20/23 Yes  Nooruddin, Jason Fila, MD  metFORMIN (GLUCOPHAGE) 500 MG tablet TAKE 1 TABLET(500 MG) BY MOUTH TWICE DAILY WITH A MEAL Patient taking differently: Take 500 mg by mouth 2 (two) times daily with a meal. 11/21/21  Yes Champ Mungo, DO  ondansetron (ZOFRAN) 4 MG tablet Take 1 tablet (4 mg total) by mouth every 8 (eight) hours as needed for nausea or vomiting. 04/02/22  Yes Malachy Mood, MD  pantoprazole (PROTONIX) 40 MG tablet Take 1 tablet (40 mg total) by mouth 2 (two) times daily. 09/19/22  Yes Malachy Mood, MD  potassium chloride (KLOR-CON M) 10 MEQ tablet Take 1 tablet (10 mEq total) by mouth daily. 07/29/22  Yes Malachy Mood, MD  PROAIR HFA 108 [id number] Base) MCG/ACT inhaler INHALE 1 TO 2 PUFFS INTO THE LUNGS EVERY 6 HOURS AS NEEDED FOR WHEEZING OR SHORTNESS OF BREATH Patient taking differently: Inhale 1-2 puffs into the lungs every 6 (six) hours as needed for wheezing or shortness of breath. 11/10/20  Yes Malachy Mood, MD  traZODone (DESYREL) 100 MG tablet TAKE 1 TABLET(100 MG) BY MOUTH AT BEDTIME Patient taking differently: Take 100 mg by mouth at bedtime. TAKE 1 TABLET(100 MG) BY MOUTH AT BEDTIME 07/23/22  Yes Malachy Mood, MD  TYLENOL PM EXTRA STRENGTH 500-25 MG TABS tablet Take 2 tablets by mouth at bedtime as needed (for sleep).   Yes [provider]  Accu-Chek Softclix Lancets lancets Use to check blood sugar before breakfast and before dinner while on steroids Patient taking differently: 1 each by Other route See admin instructions. Use to check blood sugar before breakfast and before dinner while on steroids 09/27/19   Claudean Severance, MD  glucose blood (ACCU-CHEK GUIDE) test strip Check blood sugar 2 times per day while on  steroids before breakfast and dinner Patient taking differently: 1 each by Other route See admin instructions. Check blood sugar 2 times per day while on steroids before breakfast and dinner 09/27/19   Claudean Severance, MD  lidocaine-prilocaine (EMLA) cream Apply 1 application topically as  needed. Patient not taking: Reported on 09/26/2022 07/17/18   Malachy Mood, MD  nicotine (NICODERM CQ - DOSED IN MG/24 HOURS) 14 mg/24hr patch Place 1 patch (14 mg total) onto the skin daily. Patient not taking: Reported on 09/26/2022 06/20/22 06/20/23  Nooruddin, Jason Fila, MD   Current Facility-Administered Medications  Medication Dose Route Frequency Provider Last Rate Last Admin   acetaminophen (TYLENOL) tablet 1,000 mg  1,000 mg Oral Q6H PRN Marrianne Mood, MD   1,000 mg at 09/26/22 1610   Or   acetaminophen (TYLENOL) suppository 650 mg  650 mg Rectal Q6H PRN Marrianne Mood, MD       ALPRAZolam Prudy Feeler) tablet 0.5 mg  0.5 mg Oral QHS PRN Marrianne Mood, MD   0.5 mg at 09/26/22 2154   citalopram (CELEXA) tablet 20 mg  20 mg Oral Daily Marrianne Mood, MD   20 mg at 09/27/22 0840   lisinopril (ZESTRIL) tablet 20 mg  20 mg Oral Daily Rana Snare, DO   20 mg at 09/27/22 0840   nicotine (NICODERM CQ - dosed in mg/24 hours) patch 14 mg  14 mg Transdermal Daily Marrianne Mood, MD   14 mg at 09/27/22 0841   ondansetron (ZOFRAN) tablet 4 mg  4 mg Oral Q6H PRN Marrianne Mood, MD       Or   ondansetron [facility]) injection 4 mg  4 mg Intravenous Q6H PRN Marrianne Mood, MD   4 mg at 09/26/22 1524   pantoprazole (PROTONIX) EC tablet 40 mg  40 mg Oral BID Rana Snare, DO   40 mg at 09/27/22 0840   traZODone (DESYREL) tablet 100 mg  100 mg Oral Alisia Ferrari, MD   100 mg at 09/26/22 2154   Facility-Administered Medications Ordered in Other Encounters  Medication Dose Route Frequency Provider Last Rate Last Admin   diphenhydrAMINE (BENADRYL) 25 mg capsule            diphenhydrAMINE (BENADRYL) 25 mg capsule            phenylephrine (NEO-SYNEPHRINE) 1 % nasal drops 2 drop  2 drop Left Nare Once Malachy Mood, MD       Allergies as of 09/25/2022 - Review Complete 09/25/2022  Allergen Reaction Noted   Feraheme [ferumoxytol] Other (See Comments) 10/09/2017   Nsaids Other (See Comments)  04/25/2014   Tomato Hives 01/01/2012   Iron (ferrous sulfate) [ferrous sulfate er] Other (See Comments) 03/07/2020   Wasp venom Swelling 10/06/2017   Family History  Problem Relation Age of Onset   Cancer Mother        Ovarian   Ovarian cancer Mother    Diabetes Mother    Kidney disease Mother    Bleeding Disorder Mother    Diabetes type II Sister    Bleeding Disorder Sister    Diabetes Sister    Colon cancer Maternal Grandfather    Crohn's disease Maternal Grandfather    Stomach cancer Maternal Grandmother    Kidney disease Son    Bleeding Disorder Son    Dysmenorrhea Neg Hx    Social History   Socioeconomic History   Marital status: Widowed    Spouse name: Not on file   Number of children: 3   Years of  education: Not on file   Highest education level: Not on file  Occupational History   Not on file  Tobacco Use   Smoking status: Every Day    Packs/day: 0.50    Years: 30.00    Additional pack years: 0.00    Total pack years: 15.00    Types: Cigarettes   Smokeless tobacco: Former    Types: Snuff    Quit date: 2   Tobacco comments:    1/2 PPD  Vaping Use   Vaping Use: Never used  Substance and Sexual Activity   Alcohol use: No    Alcohol/week: 0.0 standard drinks of alcohol   Drug use: No    Comment: last cocaine-2010   Sexual activity: Yes    Birth control/protection: Surgical    Comment: Hysterectomy  Other Topics Concern   Not on file  Social History Narrative   Not on file   Social Determinants of Health   Financial Resource Strain: Not on file  Food Insecurity: Food Insecurity Present (09/26/2022)   Hunger Vital Sign    Worried About Running Out of Food in the Last Year: Sometimes true    Ran Out of Food in the Last Year: Sometimes true  Transportation Needs: No Transportation Needs (09/26/2022)   PRAPARE - Administrator, Civil Service (Medical): No    Lack of Transportation (Non-Medical): No  Physical Activity: Not on file   Stress: Not on file  Social Connections: Not on file  Intimate Partner Violence: Not At Risk (09/26/2022)   Humiliation, Afraid, Rape, and Kick questionnaire    Fear of Current or Ex-Partner: No    Emotionally Abused: No    Physically Abused: No    Sexually Abused: No   Review of Systems:  Review of Systems  Constitutional:  Positive for chills and fever.  Respiratory:  Positive for shortness of breath.   Cardiovascular:  Negative for chest pain.  Gastrointestinal:  Positive for abdominal pain, diarrhea, melena and nausea. Negative for vomiting.    OBJECTIVE:   Temp:  [98 F (36.7 C)-98.6 F (37 C)] 98.2 F (36.8 C) (06/14 0744) Pulse Rate:  [71-82] 71 (06/14 0744) Resp:  [14-18] 18 (06/14 0744) BP: (129-156)/(51-83) 143/71 (06/14 0744) SpO2:  [95 %-100 %] 95 % (06/14 0744) Last BM Date : 09/26/22 Physical Exam Constitutional:      General: She is not in acute distress.    Appearance: She is not ill-appearing, toxic-appearing or diaphoretic.  Cardiovascular:     Rate and Rhythm: Normal rate. Rhythm irregular.  Pulmonary:     Effort: No respiratory distress.     Breath sounds: Normal breath sounds.  Abdominal:     General: Bowel sounds are normal. There is no distension.     Palpations: Abdomen is soft.     Tenderness: There is abdominal tenderness (LLQ). There is no guarding.  Musculoskeletal:     Right lower leg: No edema.     Left lower leg: No edema.  Skin:    General: Skin is warm and dry.  Neurological:     Mental Status: She is alert.     Labs: Recent Labs    09/25/22 2134 09/25/22 2203 09/26/22 0808 09/26/22 1626 09/27/22 0014  WBC 14.7*  --  10.6*  --  11.3*  HGB 7.1*   < > 6.5* 8.3* 8.2*  HCT 24.3*   < > 22.3* 26.3* 26.8*  PLT 184  --  164  --  181   < > =  values in this interval not displayed.   BMET Recent Labs    09/25/22 2134 09/25/22 2203 09/26/22 0808  NA 137 141 141  K 3.7 3.7 3.8  CL 110 109 111  CO2 21*  --  24  GLUCOSE 105*  100* 96  BUN 10 12 15   CREATININE 0.98 0.90 0.79  CALCIUM 8.0*  --  8.3*   LFT Recent Labs    09/25/22 2134  PROT 5.4*  ALBUMIN 2.7*  AST 25  ALT 10  ALKPHOS 56  BILITOT 0.4   PT/INR No results for input(s): "LABPROT", "INR" in the last 72 hours.  Diagnostic imaging: CT ANGIO GI BLEED  Result Date: 09/25/2022 CLINICAL DATA:  Abdominal pain, acute, nonlocalized.  Nausea EXAM: CTA ABDOMEN AND PELVIS WITHOUT AND WITH CONTRAST TECHNIQUE: Multidetector CT imaging of the abdomen and pelvis was performed using the standard protocol during bolus administration of intravenous contrast. Multiplanar reconstructed images and MIPs were obtained and reviewed to evaluate the vascular anatomy. RADIATION DOSE REDUCTION: This exam was performed according to the departmental dose-optimization program which includes automated exposure control, adjustment of the mA and/or kV according to patient size and/or use of iterative reconstruction technique. CONTRAST:  OMNIPAQUE IOHEXOL 350 MG/ML SOLN COMPARISON:  None Available. FINDINGS: VASCULAR Aorta: Scattered infrarenal aortic atherosclerosis. No aneurysm or dissection. No significant stenosis. Celiac: Patent without evidence of aneurysm, dissection, vasculitis or significant stenosis. SMA: Patent without evidence of aneurysm, dissection, vasculitis or significant stenosis. Renals: Both renal arteries are patent without evidence of aneurysm, dissection, vasculitis, fibromuscular dysplasia or significant stenosis. IMA: Patent without evidence of aneurysm, dissection, vasculitis or significant stenosis. Inflow: Moderate atherosclerotic calcifications. No aneurysm, dissection, or significant stenosis. Proximal Outflow: Scattered calcifications. No aneurysm, dissection or significant stenosis. Veins: No obvious venous abnormality within the limitations of this arterial phase study. Review of the MIP images confirms the above findings. NON-VASCULAR Lower chest: No  acute abnormality. Linear scarring or chronic atelectasis in the lung bases, stable. Hepatobiliary: Small calcification within the gallbladder, stable. Gallbladder is nondistended. No biliary ductal dilatation. No focal hepatic abnormality. Pancreas: No focal abnormality or ductal dilatation. Spleen: No focal abnormality.  Normal size. Adrenals/Urinary Tract: Small cyst in the upper pole of the right kidney, stable. No suspicious renal or adrenal abnormality. No follow-up imaging recommended. No hydronephrosis. Urinary bladder unremarkable. Stomach/Bowel: Surgical clips within and around the stomach. Stomach is moderately distended. No active extravasation of contrast within the bowel to localize GI bleed. Small and large bowel decompressed, unremarkable. No bowel obstruction. Lymphatic: No adenopathy Reproductive: Prior hysterectomy.  No adnexal masses. Other: No free fluid or free air. Musculoskeletal: No acute bony abnormality. IMPRESSION: VASCULAR Aortoiliac atherosclerosis.  No aneurysm or dissection. No active contrast extravasation to localize GI bleed. NON-VASCULAR Moderate distention of the stomach with debris. Cholelithiasis.  No CT evidence of acute cholecystitis. No acute findings. Electronically Signed   By: Charlett Nose M.D.   On: 09/25/2022 23:25    IMPRESSION: Melena Acute blood loss anemia Hereditary hemorrhagic telangiectasia History epistaxis History gastric and small bowel arteriovenous malformation  -EGD February 2024 required gastric cardia AVM treatment  -Colonoscopy November 2021 required ascending and cecum AVM treatment Type 2 diabetes mellitus Presumed viral gastroenteritis  PLAN: -Recommend EGD to further evaluate melena, acute blood loss anemia and history of AVMs -Discussed procedure in detail with patient, including benefits, alternatives, risks including bleeding/infection/perforation/missed lesion/anesthesia, she verbalized understanding and elected to  proceed -Maintain n.p.o. today -Continue PPI therapy -Maintenance IV fluids -Further recommendations  to follow pending procedure, timing to be determined    LOS: 1 day   Liliane Shi, [facility]

## 2022-09-27 NOTE — Anesthesia Procedure Notes (Signed)
Procedure Name: MAC Date/Time: 09/27/2022 10:41 AM  Performed by: Audie Pinto, CRNAPre-anesthesia Checklist: Patient identified, Emergency Drugs available, Suction available and Patient being monitored Patient Re-evaluated:Patient Re-evaluated prior to induction Oxygen Delivery Method: Nasal cannula Placement Confirmation: positive ETCO2 Dental Injury: Teeth and Oropharynx as per pre-operative assessment

## 2022-09-27 NOTE — Op Note (Signed)
[facility] Patient Name: Peggy House Procedure Date : 09/27/2022 MRN: [id number] Attending MD: Liliane Shi DO, DO, [id number] Date of Birth: 14-Apr-1961 CSN: [id number] Age: 62 Admit Type: Inpatient Procedure:                Upper GI endoscopy Indications:              Acute post hemorrhagic anemia, Melena Providers:                Liliane Shi DO, DO, Marge Duncans, RN, Rozetta Nunnery, Technician Referring MD:              Medicines:                See the Anesthesia note for documentation of the                            administered medications Complications:            No blood was noted in oropharynx on scope                            insertion. On withdrawal of scope there was small                            amount of fresh bright red blood in oropharynx                            which was suctioned, undetermined if this is from                            epistaxis at this time. Estimated Blood Loss:     Estimated blood loss was minimal. Estimated blood                            loss was minimal. Procedure:                Pre-Anesthesia Assessment:                           - ASA Grade Assessment: III - A patient with severe                            systemic disease.                           - The risks and benefits of the procedure and the                            sedation options and risks were discussed with the                            patient. All questions were answered and informed  consent was obtained.                           After obtaining informed consent, the endoscope was                            passed under direct vision. Throughout the                            procedure, the patient's blood pressure, pulse, and                            oxygen saturations were monitored continuously. The                            GIF-H190 ([id number]) Olympus endoscope was  introduced                            through the mouth, and advanced to the second part                            of duodenum. The upper GI endoscopy was                            accomplished without difficulty. The patient                            tolerated the procedure well. Scope In: Scope Out: Findings:      A 2 cm hiatal hernia was present.      An endoclip was found in the cardia.      Multiple diminutive sessile polyps with no bleeding and no stigmata of       recent bleeding were found in the gastric body.      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 The lesion was 4 mm in       largest dimension. To prevent bleeding post-intervention, three       hemostatic clips were successfully placed. There was no bleeding at the       end of the procedure.      Localized mild inflammation characterized by congestion (edema) was       found in the duodenal bulb. Impression:               - 2 cm hiatal hernia.                           - An endoclip was found in the stomach.                           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 Clips were placed.                           -  Normal examined duodenum.                           - No specimens collected. Recommendation:           - Return patient to hospital ward for ongoing care.                           - Full liquid diet today.                           - Continue present medications.                           - Check hemoglobin q 12 hours for two days.                           - Check H.Pylori stool antigen for completeness. Procedure Code(s):        --- Professional ---                           [phone], Esophagogastroduodenoscopy, flexible,                            transoral; diagnostic, including collection of                            specimen(s) by brushing or washing, when performed                             (separate procedure) Diagnosis Code(s):        --- Professional ---                           K44.9, Diaphragmatic hernia without obstruction or                            gangrene                           T18.2XXA, Foreign body in stomach, initial encounter                           D62, Acute posthemorrhagic anemia                           K92.1, Melena (includes Hematochezia) CPT copyright 2022 [employer]. All rights reserved. The codes documented in this report are preliminary and upon coder review may  be revised to meet current compliance requirements. Dr Liliane Shi, DO Liliane Shi DO, DO 09/27/2022 11:22:31 AM Number of Addenda: 0

## 2022-09-28 DIAGNOSIS — F339 Major depressive disorder, recurrent, unspecified: Secondary | ICD-10-CM | POA: Diagnosis not present

## 2022-09-28 DIAGNOSIS — E1151 Type 2 diabetes mellitus with diabetic peripheral angiopathy without gangrene: Secondary | ICD-10-CM

## 2022-09-28 DIAGNOSIS — D500 Iron deficiency anemia secondary to blood loss (chronic): Secondary | ICD-10-CM

## 2022-09-28 DIAGNOSIS — K921 Melena: Secondary | ICD-10-CM | POA: Diagnosis not present

## 2022-09-28 DIAGNOSIS — I780 Hereditary hemorrhagic telangiectasia: Secondary | ICD-10-CM | POA: Diagnosis not present

## 2022-09-28 DIAGNOSIS — D62 Acute posthemorrhagic anemia: Secondary | ICD-10-CM | POA: Diagnosis not present

## 2022-09-28 LAB — BASIC METABOLIC PANEL
Anion gap: 12 (ref 5–15)
BUN: 11 mg/dL (ref 8–23)
CO2: 22 mmol/L (ref 22–32)
Calcium: 8.8 mg/dL — ABNORMAL LOW (ref 8.9–10.3)
Chloride: 105 mmol/L (ref 98–111)
Creatinine, Ser: 0.76 mg/dL (ref 0.44–1.00)
GFR, Estimated: >60 mL/min (ref >60)
Glucose, Bld: 102 mg/dL — ABNORMAL HIGH (ref 70–99)
Potassium: 3.5 mmol/L (ref 3.5–5.1)
Sodium: 139 mmol/L (ref 135–145)

## 2022-09-28 LAB — CBC
HCT: 26.5 % — ABNORMAL LOW (ref 36.0–46.0)
Hemoglobin: 8.2 g/dL — ABNORMAL LOW (ref 12.0–15.0)
MCH: 26.5 pg (ref 26.0–34.0)
MCHC: 30.9 g/dL (ref 30.0–36.0)
MCV: 85.8 fL (ref 80.0–100.0)
Platelets: 180 10*3/uL (ref 150–400)
RBC: 3.09 MIL/uL — ABNORMAL LOW (ref 3.87–5.11)
RDW: 25.1 % — ABNORMAL HIGH (ref 11.5–15.5)
WBC: 9.6 10*3/uL (ref 4.0–10.5)
nRBC: 0 % (ref 0.0–0.2)

## 2022-09-28 LAB — URINE CULTURE: Culture: >=100000 — AB

## 2022-09-28 MED ORDER — PANTOPRAZOLE SODIUM 40 MG PO TBEC: 40.0000 mg | DELAYED_RELEASE_TABLET | Freq: Two times a day (BID) | ORAL | 0 refills | Status: DC

## 2022-09-28 NOTE — Progress Notes (Signed)
Alert and oriented verbalized understanding of dc instructions., all belongings and dc papers given to patient

## 2022-09-28 NOTE — Progress Notes (Signed)
[facility] Progress Note  SUBJECTIVE:   Interval history: Peggy House was seen and evaluated today at bedside prior to discharge today. She was resting comfortably in chair. She denied any nausea or vomiting, she has tolerated soft foods. She has no abdominal pain. She has had no further bowel movement since procedure. No chest pain or shortness of breath. Is agreeable to discharge today.  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   Past Surgical History:  Procedure Laterality Date   ABDOMINAL HYSTERECTOMY     CARPAL TUNNEL RELEASE  05/13/2011   Procedure: CARPAL TUNNEL RELEASE;  Surgeon: Mable Paris, MD;  Location: [facility];  Service: Orthopedics;  Laterality: Left;   COLONOSCOPY N/A 03/02/2020   Procedure: COLONOSCOPY;  Surgeon: Bernette Redbird, MD;  Location: [facility] ENDOSCOPY;  Service: Endoscopy;  Laterality: N/A;   COLONOSCOPY WITH PROPOFOL N/A 04/28/2014   Procedure: COLONOSCOPY WITH PROPOFOL;  Surgeon: Florencia Reasons, MD;  Location: [facility] ENDOSCOPY;  Service: Endoscopy;  Laterality: N/A;   DG TOES*L*  2/10   rt   DILATION AND CURETTAGE OF UTERUS     ENTEROSCOPY N/A 10/17/2017   Procedure: ENTEROSCOPY;  Surgeon: Kathi Der, MD;  Location: [facility] ENDOSCOPY;  Service: Gastroenterology;  Laterality: N/A;   ESOPHAGOGASTRODUODENOSCOPY N/A 04/10/2014   Procedure: ESOPHAGOGASTRODUODENOSCOPY (EGD);  Surgeon: Shirley Friar, MD;  Location: [facility] ENDOSCOPY;  Service: Endoscopy;  Laterality: N/A;   ESOPHAGOGASTRODUODENOSCOPY N/A 05/10/2017   Procedure: ESOPHAGOGASTRODUODENOSCOPY (EGD);  Surgeon: Bernette Redbird, MD;  Location: [facility] ENDOSCOPY;  Service: Endoscopy;  Laterality: N/A;   ESOPHAGOGASTRODUODENOSCOPY N/A 09/22/2017   Procedure: ESOPHAGOGASTRODUODENOSCOPY (EGD);  Surgeon: Vida Rigger, MD;  Location: [facility] ENDOSCOPY;  Service: Endoscopy;  Laterality: N/A;  bedside   ESOPHAGOGASTRODUODENOSCOPY N/A 03/02/2020   Procedure: ESOPHAGOGASTRODUODENOSCOPY (EGD);  Surgeon: Bernette Redbird, MD;  Location: [facility] ENDOSCOPY;  Service: Endoscopy;  Laterality: N/A;   ESOPHAGOGASTRODUODENOSCOPY N/A 11/03/2020   Procedure: ESOPHAGOGASTRODUODENOSCOPY (EGD);  Surgeon: Charlott Rakes, MD;  Location: [facility] ENDOSCOPY;  Service: Endoscopy;  Laterality: N/A;   ESOPHAGOGASTRODUODENOSCOPY N/A 04/12/2022   Procedure: ESOPHAGOGASTRODUODENOSCOPY (EGD);  Surgeon: Vida Rigger, MD;  Location: [facility] ENDOSCOPY;  Service: Gastroenterology;  Laterality: N/A;   ESOPHAGOGASTRODUODENOSCOPY N/A 05/27/2022   Procedure: ESOPHAGOGASTRODUODENOSCOPY (EGD);  Surgeon: Willis Modena, MD;  Location: [facility] ENDOSCOPY;  Service: Gastroenterology;  Laterality: N/A;   ESOPHAGOGASTRODUODENOSCOPY (EGD) WITH PROPOFOL N/A 04/27/2014   Procedure: ESOPHAGOGASTRODUODENOSCOPY (EGD) WITH PROPOFOL;  Surgeon: Florencia Reasons, MD;  Location: [facility] ENDOSCOPY;  Service: Endoscopy;  Laterality: N/A;  possible apc   ESOPHAGOGASTRODUODENOSCOPY (EGD) WITH PROPOFOL N/A 09/30/2017   Procedure: ESOPHAGOGASTRODUODENOSCOPY (EGD) WITH PROPOFOL;  Surgeon: Kerin Salen, MD;  Location: [facility] ENDOSCOPY;  Service: Gastroenterology;  Laterality: N/A;   ESOPHAGOGASTRODUODENOSCOPY (EGD) WITH PROPOFOL N/A 10/01/2017   Procedure: ESOPHAGOGASTRODUODENOSCOPY (EGD) WITH PROPOFOL;  Surgeon: Kerin Salen, MD;  Location: [facility] ENDOSCOPY;  Service: Gastroenterology;  Laterality: N/A;   ESOPHAGOGASTRODUODENOSCOPY (EGD) WITH PROPOFOL N/A 10/08/2017   Procedure: ESOPHAGOGASTRODUODENOSCOPY (EGD) WITH PROPOFOL;  Surgeon: Kathi Der, MD;  Location: [facility] ENDOSCOPY;  Service: Gastroenterology;  Laterality: N/A;    ESOPHAGOGASTRODUODENOSCOPY (EGD) WITH PROPOFOL N/A 10/17/2017   Procedure: ESOPHAGOGASTRODUODENOSCOPY (  EGD) WITH PROPOFOL;  Surgeon: Kathi Der, MD;  Location: [facility] ENDOSCOPY;  Service: Gastroenterology;  Laterality: N/A;   ESOPHAGOGASTRODUODENOSCOPY (EGD) WITH PROPOFOL N/A 10/19/2017   Procedure: ESOPHAGOGASTRODUODENOSCOPY (EGD) WITH PROPOFOL;  Surgeon: Kathi Der, MD;  Location: [facility] ENDOSCOPY;  Service: Gastroenterology;  Laterality: N/A;   ESOPHAGOGASTRODUODENOSCOPY (EGD) WITH PROPOFOL N/A 12/04/2018   Procedure: ESOPHAGOGASTRODUODENOSCOPY (EGD) WITH PROPOFOL;  Surgeon: Charlott Rakes, MD;  Location: [facility] ENDOSCOPY;  Service: Endoscopy;  Laterality: N/A;   GIVENS CAPSULE STUDY N/A 10/02/2017   Procedure: GIVENS CAPSULE STUDY;  Surgeon: Kerin Salen, MD;  Location: [facility] ENDOSCOPY;  Service: Gastroenterology;  Laterality: N/A;   GIVENS CAPSULE STUDY N/A 10/08/2017   Procedure: GIVENS CAPSULE STUDY;  Surgeon: Kathi Der, MD;  Location: [facility] ENDOSCOPY;  Service: Gastroenterology;  Laterality: N/A;  endoscopic placement of capsule   GIVENS CAPSULE STUDY N/A 03/02/2020   Procedure: GIVENS CAPSULE STUDY;  Surgeon: Bernette Redbird, MD;  Location: [facility] ENDOSCOPY;  Service: Endoscopy;  Laterality: N/A;   HEMOSTASIS CLIP PLACEMENT  12/04/2018   Procedure: HEMOSTASIS CLIP PLACEMENT;  Surgeon: Charlott Rakes, MD;  Location: [facility] ENDOSCOPY;  Service: Endoscopy;;   HEMOSTASIS CLIP PLACEMENT  05/27/2022   Procedure: HEMOSTASIS CLIP PLACEMENT;  Surgeon: Willis Modena, MD;  Location: [facility] ENDOSCOPY;  Service: Gastroenterology;;   HEMOSTASIS CONTROL  05/27/2022   Procedure: HEMOSTASIS CONTROL;  Surgeon: Willis Modena, MD;  Location: [facility] ENDOSCOPY;  Service: Gastroenterology;;   HOT HEMOSTASIS N/A 04/27/2014   Procedure: HOT HEMOSTASIS (ARGON PLASMA COAGULATION/BICAP);  Surgeon: Florencia Reasons, MD;  Location: [facility] ENDOSCOPY;  Service: Endoscopy;  Laterality: N/A;   HOT HEMOSTASIS N/A 09/30/2017   Procedure:  HOT HEMOSTASIS (ARGON PLASMA COAGULATION/BICAP);  Surgeon: Kerin Salen, MD;  Location: [facility] ENDOSCOPY;  Service: Gastroenterology;  Laterality: N/A;   HOT HEMOSTASIS N/A 10/01/2017   Procedure: HOT HEMOSTASIS (ARGON PLASMA COAGULATION/BICAP);  Surgeon: Kerin Salen, MD;  Location: [facility] ENDOSCOPY;  Service: Gastroenterology;  Laterality: N/A;   HOT HEMOSTASIS N/A 10/17/2017   Procedure: HOT HEMOSTASIS (ARGON PLASMA COAGULATION/BICAP);  Surgeon: Kathi Der, MD;  Location: [facility] ENDOSCOPY;  Service: Gastroenterology;  Laterality: N/A;   HOT HEMOSTASIS N/A 10/19/2017   Procedure: HOT HEMOSTASIS (ARGON PLASMA COAGULATION/BICAP);  Surgeon: Kathi Der, MD;  Location: [facility] ENDOSCOPY;  Service: Gastroenterology;  Laterality: N/A;   HOT HEMOSTASIS N/A 03/02/2020   Procedure: HOT HEMOSTASIS (ARGON PLASMA COAGULATION/BICAP);  Surgeon: Bernette Redbird, MD;  Location: [facility] ENDOSCOPY;  Service: Endoscopy;  Laterality: N/A;   HOT HEMOSTASIS N/A 04/12/2022   Procedure: HOT HEMOSTASIS (ARGON PLASMA COAGULATION/BICAP);  Surgeon: Vida Rigger, MD;  Location: [facility] ENDOSCOPY;  Service: Gastroenterology;  Laterality: N/A;   IR IMAGING GUIDED PORT INSERTION  07/08/2018   L shoulder Surgery  2011   POLYPECTOMY  03/02/2020   Procedure: POLYPECTOMY;  Surgeon: Bernette Redbird, MD;  Location: [facility] ENDOSCOPY;  Service: Endoscopy;;   SCLEROTHERAPY  11/03/2020   Procedure: Susa Day;  Surgeon: Charlott Rakes, MD;  Location: [facility] ENDOSCOPY;  Service: Endoscopy;;   SUBMUCOSAL INJECTION  09/22/2017   Procedure: SUBMUCOSAL INJECTION;  Surgeon: Vida Rigger, MD;  Location: [facility] ENDOSCOPY;  Service: Endoscopy;;   SUBMUCOSAL INJECTION  12/04/2018   Procedure: SUBMUCOSAL INJECTION;  Surgeon: Charlott Rakes, MD;  Location: [facility] ENDOSCOPY;  Service: Endoscopy;;   Current Facility-Administered Medications  Medication Dose Route Frequency Provider Last Rate Last Admin   acetaminophen (TYLENOL) tablet 1,000 mg  1,000 mg Oral Q6H PRN Liliane Shi H, DO   1,000 mg at 09/28/22 9147   Or   acetaminophen (TYLENOL) suppository 650 mg  650 mg Rectal Q6H PRN Lynann Bologna, DO       ALPRAZolam Prudy Feeler) tablet 0.5 mg  0.5 mg Oral QHS PRN Liliane Shi H, DO   0.5 mg at 09/27/22 2133   citalopram (CELEXA) tablet 20 mg  20 mg Oral Daily Liliane Shi H, DO   20 mg at 09/27/22 0840   lisinopril (ZESTRIL) tablet 20 mg  20 mg Oral Daily Liliane Shi H, DO   20 mg at 09/27/22 0840   nicotine (NICODERM CQ - dosed in mg/24 hours) patch 14 mg  14 mg Transdermal Daily Liliane Shi H, DO   14 mg at 09/27/22 0841   ondansetron (ZOFRAN) tablet 4 mg  4 mg Oral Q6H PRN Lynann Bologna, DO       Or   ondansetron [facility]) injection 4 mg  4 mg Intravenous Q6H PRN Liliane Shi H, DO   4 mg at 09/26/22 1524   pantoprazole (PROTONIX) EC tablet 40 mg  40 mg Oral BID Lynann Bologna, DO   40 mg at 09/27/22 2133   traZODone (DESYREL) tablet 100 mg  100 mg Oral QHS Liliane Shi H, DO   100 mg at 09/27/22 2133   Facility-Administered Medications Ordered in Other Encounters  Medication Dose Route Frequency Provider Last Rate Last Admin   diphenhydrAMINE (BENADRYL) 25 mg capsule            diphenhydrAMINE (BENADRYL) 25 mg capsule            phenylephrine (NEO-SYNEPHRINE) 1 % nasal drops 2 drop  2 drop Left Nare Once Malachy Mood, MD       Allergies as of 09/25/2022 - Review Complete 09/25/2022  Allergen Reaction Noted   Feraheme [ferumoxytol] Other (See Comments) 10/09/2017   Nsaids Other (See Comments) 04/25/2014   Tomato Hives 01/01/2012   Iron (ferrous sulfate) [ferrous sulfate er] Other (See Comments) 03/07/2020   Wasp venom Swelling 10/06/2017   Review of Systems:  Review of Systems  Respiratory:  Negative for shortness of breath.   Cardiovascular:  Negative for chest pain.  Gastrointestinal:  Negative for abdominal pain, nausea and vomiting.    OBJECTIVE:   Temp:  [97.4 F (36.3 C)-98.3 F (36.8 C)] 97.7 F (36.5 C)  (06/15 0728) Pulse Rate:  [59-73] 59 (06/15 0728) Resp:  [16-26] 18 (06/15 0728) BP: (127-168)/(61-91) 157/81 (06/15 0728) SpO2:  [94 %-100 %] 98 % (06/15 0728) Last BM Date : 09/26/22 Physical Exam Constitutional:      General: She is not in acute distress.    Appearance: She is not ill-appearing, toxic-appearing or diaphoretic.  Cardiovascular:     Rate and Rhythm: Normal rate and regular rhythm.  Pulmonary:     Effort: No respiratory distress.     Breath sounds: Normal breath sounds.  Abdominal:     General: Bowel sounds are normal. There is no distension.     Palpations: Abdomen is soft.     Tenderness: There is no abdominal tenderness. There is no guarding.  Musculoskeletal:     Right lower leg: No edema.     Left lower leg: No edema.  Skin:    General: Skin is warm and dry.  Neurological:     Mental Status: She is alert.     Labs: Recent Labs    09/27/22 0014 09/27/22 1817 09/28/22 0553  WBC 11.3* 11.5* 9.6  HGB 8.2* 8.8* 8.2*  HCT 26.8* 29.2* 26.5*  PLT 181 178 180   BMET Recent Labs  09/25/22 2134 09/25/22 2203 09/26/22 0808 09/28/22 0553  NA 137 141 141 139  K 3.7 3.7 3.8 3.5  CL 110 109 111 105  CO2 21*  --  24 22  GLUCOSE 105* 100* 96 102*  BUN 10 12 15 11   CREATININE 0.98 0.90 0.79 0.76  CALCIUM 8.0*  --  8.3* 8.8*   LFT Recent Labs    09/25/22 2134  PROT 5.4*  ALBUMIN 2.7*  AST 25  ALT 10  ALKPHOS 56  BILITOT 0.4   PT/INR No results for input(s): "LABPROT", "INR" in the last 72 hours. Diagnostic imaging: No results found.  IMPRESSION: Melena, improved  -EGD 09/27/22 with gastric fundus ulcer possibly involving AVM s/p hemoclip placement x 3 Acute blood loss anemia, stable Hereditary hemorrhagic telangiectasia History epistaxis History gastric and small bowel arteriovenous malformation             -EGD February 2024 required gastric cardia AVM treatment             -Colonoscopy November 2021 required ascending and cecum AVM  treatment Type 2 diabetes mellitus Presumed viral gastroenteritis  PLAN: -Patient appears to be doing clinically well today, Hgb is stable -No bowel movement since procedure, no melena -Tolerating diet -Recommend H.Pylori stool antigen for completeness, this can be followed up in the outpatient setting -Appears stable for discharge from GI standpoint, discussed with IM  -Follow up with [facility] (Dr. Ewing Schlein) in outpatient setting in roughly 2 months    LOS: 2 days   Liliane Shi, [facility]

## 2022-09-28 NOTE — Discharge Summary (Addendum)
Name: Peggy House MRN: [id number] DOB: Sep 02, 1960 62 y.o. PCP: Olegario Messier, MD  Date of Admission: 09/25/2022  8:43 PM Date of Discharge:  09/28/2022 Attending Physician: Dr. Cleda Daub  DISCHARGE DIAGNOSIS:  Primary Problem: Acute on chronic blood loss anemia   Hospital Problems: Principal Problem:   Acute on chronic blood loss anemia Active Problems:   Major depressive disorder, recurrent episode (HCC)   Type 2 diabetes mellitus with vascular disease (HCC)   HHT (hereditary hemorrhagic telangiectasia) (HCC)   Symptomatic anemia    DISCHARGE MEDICATIONS:   Allergies as of 09/28/2022       Reactions   Feraheme [ferumoxytol] Other (See Comments)   Muscle tetany, encephalopathy, fatigue, nausea (reaction to injection)   Nsaids Other (See Comments)   Stomach bleeding episodes; Tylenol is OK   Tomato Hives   Iron (ferrous Sulfate) [ferrous Sulfate Er] Other (See Comments)   Shock   Wasp Venom Swelling        Medication List     STOP taking these medications    potassium chloride 10 MEQ tablet Commonly known as: KLOR-CON M       TAKE these medications    Accu-Chek Guide test strip Generic drug: glucose blood Check blood sugar 2 times per day while on steroids before breakfast and dinner What changed:  how much to take how to take this when to take this   Accu-Chek Softclix Lancets lancets Use to check blood sugar before breakfast and before dinner while on steroids What changed:  how much to take how to take this when to take this   ALPRAZolam 0.5 MG tablet Commonly known as: XANAX TAKE 1 TABLET BY MOUTH AT BEDTIME AS NEEDED FOR ANXIETY OR SLEEP What changed: See the new instructions.   Aminocaproic Acid 1000 MG Tabs Take 1 tablet (1,000 mg total) by mouth in the morning and at bedtime.   amLODipine 10 MG tablet Commonly known as: NORVASC TAKE 1 TABLET(10 MG) BY MOUTH DAILY What changed: See the new instructions.   citalopram 20  MG tablet Commonly known as: CeleXA Take 1 tablet (20 mg total) by mouth daily.   lidocaine-prilocaine cream Commonly known as: EMLA Apply 1 application topically as needed.   lisinopril 20 MG tablet Commonly known as: ZESTRIL Take 1 tablet (20 mg total) by mouth daily.   metFORMIN 500 MG tablet Commonly known as: GLUCOPHAGE TAKE 1 TABLET(500 MG) BY MOUTH TWICE DAILY WITH A MEAL What changed:  how much to take how to take this when to take this additional instructions   nicotine 14 mg/24hr patch Commonly known as: NICODERM CQ - dosed in mg/24 hours Place 1 patch (14 mg total) onto the skin daily.   ondansetron 4 MG tablet Commonly known as: Zofran Take 1 tablet (4 mg total) by mouth every 8 (eight) hours as needed for nausea or vomiting.   pantoprazole 40 MG tablet Commonly known as: PROTONIX Take 1 tablet (40 mg total) by mouth 2 (two) times daily.   ProAir HFA 108 (90 Base) MCG/ACT inhaler Generic drug: albuterol INHALE 1 TO 2 PUFFS INTO THE LUNGS EVERY 6 HOURS AS NEEDED FOR WHEEZING OR SHORTNESS OF BREATH What changed: See the new instructions.   traZODone 100 MG tablet Commonly known as: DESYREL TAKE 1 TABLET(100 MG) BY MOUTH AT BEDTIME What changed:  how much to take how to take this when to take this   Tylenol PM Extra Strength 25-500 MG Tabs tablet Generic drug: diphenhydramine-acetaminophen Take 2  tablets by mouth at bedtime as needed (for sleep).        DISPOSITION AND FOLLOW-UP:  Peggy House was discharged from [facility] in Stable condition. 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 Hb stable on day of discharge. Advised to continue PPI Should follow up with GI (Dr. Ewing Schlein) in ~2 months GI recommends H. Pylori stool antigen test for completeness, in the outpatient setting Depression: On Celexa + Trazodone, which  increases risk of serotonin syndrome. Please address at hospital follow up Hx of HHT Dr. Mosetta Putt would like this patient to follow up with her 1 week after discharge. Ensure this is arranged.   Follow-up Recommendations: Consults: GI, heme/onc Labs: CBC, H. Pylori stool Ag Studies: none New Medications: none  Follow-up Appointments:  Follow-up Information     Malachy Mood, MD. Call.   Specialties: Hematology, Oncology Why: Call for an urgent follow-up appointment as soon as possible after discharge from the hospital. Contact information: [address] [place] [address] [phone]         Steffanie Rainwater, MD .   Specialty: Internal Medicine Contact information: [address] [place] [address] [phone]                 HOSPITAL COURSE:  Patient Summary: Peggy House is a 62 y.o. with a pertinent PMH of HHT, T2DM, MDD, tobacco use who presents with abdominal discomfort, nausea, diarrhea with orthostatic near syncope and admitted for acute on chronic anemia likely from epistaxis and bleeding AVM.   #Acute on chronic normocytic anemia #AVMs Presents with increased frequency of epistaxis averaging 2-3 per week that is prolonged and melena. 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  Hgb dropped to 6.5 from 7.1.  Iron studies showed iron 69, TIBC 241, saturation 29% and ferritin of 48. BUN WNL. She endorsed some lightheadedness prior to admission.  The patient required 1u PRBC during admission and also had an EGD performed with showed a nonbleeding gastric ulcer with possible AVM involvement s/p hemoclips. She did not have any further bleeding and Hb and vitals were stable on the day of discharge.   #Hereditary hemorrhagic telangiectasia She follows with Dr. Mosetta Putt and last received bevacizumab on 6/6 along with iron infusions. Patient to follow up with Dr. Mosetta Putt one week after discharge.   #Abdominal pain with nausea and diarrhea Recent  history of nonbloody diarrhea with loose stools yesterday but none this morning.  Notes grandchild at home had similar symptoms.  Had abdominal pain but that has since improved. CT abdomen and pelvis without acute findings or bleeding.  Received IV fluids on arrival and symptoms improved with supportive care.   #HTN On lisinopril and amlodipine at home. Resumed home meds.    #T2DM, well-controlled Last A1c 5% on metformin.  Continue CBG monitoring.   #MDD #Sleep disturbance Continued home citalopram 20 mg, trazodone, and Xanax 0.5 mg nightly PRN. Will need medications addressed as outpatient, due to risk of serotonin syndrome,    #Tobacco use States 7-10 cigarettes a day. Has tried to quit but unsuccessful. Provided nicotine patch while in the hospital.      DISCHARGE INSTRUCTIONS:   Discharge Instructions     Call MD for:   Complete by: As directed    Large, black, tarry bowel movements   Call MD for:  difficulty breathing,  headache or visual disturbances   Complete by: As directed    Call MD for:  persistant dizziness or light-headedness   Complete by: As directed    Call MD for:  temperature >100.4   Complete by: As directed    Diet general   Complete by: As directed    Discharge instructions   Complete by: As directed    Peggy House,  You were in the hospital because you had bleeding and needed to have an endoscopy. When the gastroenterologist (stomach doctors) did this procedure on 6/14, they found some bleeding and took care of this. You will need to keep taking Protonix 40 mg twice a day, to hopefully prevent further bleeding.  You also do not need to keep taking your potassium supplement, but otherwise, none of your medications have changed.   You have an appointment with our internal medicine center on 6/24 at 9:15 AM with Dr. Kirke Corin. Just a reminder that our clinic is located on the ground floor of this hospital. If you have any questions or concerns,  call our clinic at [phone] or after hours call [phone] and ask for the internal medicine resident on call.  Additionally, Per Dr. Mosetta Putt, you should call her office to set up a one week follow up. She would like to see you since you were just in the hospital. Her phone number is [phone].   We are so glad you are feeling better! Take care.   Increase activity slowly   Complete by: As directed        SUBJECTIVE:  Peggy House was evaluated on the day of discharge. She feels well and denies any evidence of epistaxis, melena, or hematochezia.   Discharge Vitals:   BP (!) 157/81 (BP Location: Left Arm)   Pulse (!) 59   Temp 97.7 F (36.5 C) (Oral)   Resp 18   Ht 5\' 5"  (1.651 m)   Wt 102.1 kg   SpO2 98%   BMI 37.44 kg/m   OBJECTIVE:  General: sitting up in bed comfortably. No acute distress. CV: RRR. Systolic murmur. No LE edema Pulmonary: Normal work of breathing Abdominal: Soft, nontender, nondistended. Normal bowel sounds. Skin: Warm and dry.  Neuro: A&Ox3. No focal deficit. Psych: Normal mood and affect    Pertinent Labs, Studies, and Procedures:     Latest Ref Rng & Units 09/28/2022    5:53 AM 09/27/2022    6:17 PM 09/27/2022   12:14 AM  CBC  WBC 4.0 - 10.5 K/uL 9.6  11.5  11.3   Hemoglobin 12.0 - 15.0 g/dL 8.2  8.8  8.2   Hematocrit 36.0 - 46.0 % 26.5  29.2  26.8   Platelets 150 - 400 K/uL 180  178  181        Latest Ref Rng & Units 09/28/2022    5:53 AM 09/26/2022    8:08 AM 09/25/2022   10:03 PM  CMP  Glucose 70 - 99 mg/dL 578  96  469   BUN 8 - 23 mg/dL 11  15  12    Creatinine 0.44 - 1.00 mg/dL 6.29  5.28  4.13   Sodium 135 - 145 mmol/L 139  141  141   Potassium 3.5 - 5.1 mmol/L 3.5  3.8  3.7   Chloride 98 - 111 mmol/L 105  111  109   CO2 22 - 32 mmol/L 22  24    Calcium 8.9 - 10.3 mg/dL 8.8  8.3  CT ANGIO GI BLEED  Result Date: 09/25/2022 CLINICAL DATA:  Abdominal pain, acute, nonlocalized.  Nausea EXAM: CTA ABDOMEN AND  PELVIS WITHOUT AND WITH CONTRAST TECHNIQUE: Multidetector CT imaging of the abdomen and pelvis was performed using the standard protocol during bolus administration of intravenous contrast. Multiplanar reconstructed images and MIPs were obtained and reviewed to evaluate the vascular anatomy. RADIATION DOSE REDUCTION: This exam was performed according to the departmental dose-optimization program which includes automated exposure control, adjustment of the mA and/or kV according to patient size and/or use of iterative reconstruction technique. CONTRAST:  OMNIPAQUE IOHEXOL 350 MG/ML SOLN COMPARISON:  None Available. FINDINGS: VASCULAR Aorta: Scattered infrarenal aortic atherosclerosis. No aneurysm or dissection. No significant stenosis. Celiac: Patent without evidence of aneurysm, dissection, vasculitis or significant stenosis. SMA: Patent without evidence of aneurysm, dissection, vasculitis or significant stenosis. Renals: Both renal arteries are patent without evidence of aneurysm, dissection, vasculitis, fibromuscular dysplasia or significant stenosis. IMA: Patent without evidence of aneurysm, dissection, vasculitis or significant stenosis. Inflow: Moderate atherosclerotic calcifications. No aneurysm, dissection, or significant stenosis. Proximal Outflow: Scattered calcifications. No aneurysm, dissection or significant stenosis. Veins: No obvious venous abnormality within the limitations of this arterial phase study. Review of the MIP images confirms the above findings. NON-VASCULAR Lower chest: No acute abnormality. Linear scarring or chronic atelectasis in the lung bases, stable. Hepatobiliary: Small calcification within the gallbladder, stable. Gallbladder is nondistended. No biliary ductal dilatation. No focal hepatic abnormality. Pancreas: No focal abnormality or ductal dilatation. Spleen: No focal abnormality.  Normal size. Adrenals/Urinary Tract: Small cyst in the upper pole of the right kidney,  stable. No suspicious renal or adrenal abnormality. No follow-up imaging recommended. No hydronephrosis. Urinary bladder unremarkable. Stomach/Bowel: Surgical clips within and around the stomach. Stomach is moderately distended. No active extravasation of contrast within the bowel to localize GI bleed. Small and large bowel decompressed, unremarkable. No bowel obstruction. Lymphatic: No adenopathy Reproductive: Prior hysterectomy.  No adnexal masses. Other: No free fluid or free air. Musculoskeletal: No acute bony abnormality. IMPRESSION: VASCULAR Aortoiliac atherosclerosis.  No aneurysm or dissection. No active contrast extravasation to localize GI bleed. NON-VASCULAR Moderate distention of the stomach with debris. Cholelithiasis.  No CT evidence of acute cholecystitis. No acute findings. Electronically Signed   By: Charlett Nose M.D.   On: 09/25/2022 23:25     Signed: Elza Rafter, D.O.  Internal Medicine Resident, PGY-2 [facility]  Pager: [phone] 9:21 AM, 09/28/2022

## 2022-09-30 ENCOUNTER — Other Ambulatory Visit: Payer: Self-pay | Admitting: Hematology

## 2022-09-30 ENCOUNTER — Encounter (HOSPITAL_COMMUNITY): Payer: Self-pay | Admitting: Internal Medicine

## 2022-09-30 ENCOUNTER — Telehealth: Payer: Self-pay | Admitting: Obstetrics and Gynecology

## 2022-09-30 NOTE — Transitions of Care (Post Inpatient/ED Visit) (Signed)
   09/30/2022  Name: HYPATIA KENISTON MRN: [id number] DOB: [birth year]  Today's TOC FU Call Status: Today's TOC FU Call Status:: Unsuccessul Call (1st Attempt) Unsuccessful Call (1st Attempt) Date: 09/30/22  Attempted to reach the patient regarding the most recent Inpatient/ED visit.  Follow Up Plan: Additional outreach attempts will be made to reach the patient to complete the Transitions of Care (Post Inpatient/ED visit) call.   Kathi Der RN, BSN [place]  [employer] - Managed Medicaid High Risk [phone]

## 2022-10-03 ENCOUNTER — Inpatient Hospital Stay: Payer: Medicaid Other

## 2022-10-03 ENCOUNTER — Other Ambulatory Visit: Payer: Medicaid Other

## 2022-10-03 ENCOUNTER — Ambulatory Visit: Payer: Medicaid Other

## 2022-10-07 ENCOUNTER — Encounter: Payer: Medicaid Other | Admitting: Student

## 2022-10-11 ENCOUNTER — Ambulatory Visit: Payer: Medicaid Other

## 2022-10-11 ENCOUNTER — Other Ambulatory Visit: Payer: Medicaid Other

## 2022-10-18 ENCOUNTER — Ambulatory Visit: Payer: Medicaid Other

## 2022-10-18 ENCOUNTER — Telehealth: Payer: Self-pay

## 2022-10-18 ENCOUNTER — Other Ambulatory Visit: Payer: Self-pay

## 2022-10-18 ENCOUNTER — Inpatient Hospital Stay: Payer: Medicaid Other

## 2022-10-18 ENCOUNTER — Other Ambulatory Visit: Payer: Medicaid Other

## 2022-10-18 ENCOUNTER — Inpatient Hospital Stay: Payer: Medicaid Other | Attending: Hematology

## 2022-10-18 VITALS — BP 145/69 | HR 71 | Temp 98.2°F | Resp 16 | Wt 227.8 lb

## 2022-10-18 DIAGNOSIS — I780 Hereditary hemorrhagic telangiectasia: Secondary | ICD-10-CM | POA: Diagnosis present

## 2022-10-18 DIAGNOSIS — D500 Iron deficiency anemia secondary to blood loss (chronic): Secondary | ICD-10-CM

## 2022-10-18 DIAGNOSIS — Z95828 Presence of other vascular implants and grafts: Secondary | ICD-10-CM

## 2022-10-18 DIAGNOSIS — D649 Anemia, unspecified: Secondary | ICD-10-CM

## 2022-10-18 LAB — CMP (CANCER CENTER ONLY)
ALT: 6 U/L (ref 0–44)
AST: 11 U/L — ABNORMAL LOW (ref 15–41)
Albumin: 3.7 g/dL (ref 3.5–5.0)
Alkaline Phosphatase: 72 U/L (ref 38–126)
Anion gap: 8 (ref 5–15)
BUN: 8 mg/dL (ref 8–23)
CO2: 24 mmol/L (ref 22–32)
Calcium: 9.2 mg/dL (ref 8.9–10.3)
Chloride: 108 mmol/L (ref 98–111)
Creatinine: 0.81 mg/dL (ref 0.44–1.00)
GFR, Estimated: >60 mL/min
Glucose, Bld: 94 mg/dL (ref 70–99)
Potassium: 3.6 mmol/L (ref 3.5–5.1)
Sodium: 140 mmol/L (ref 135–145)
Total Bilirubin: 0.2 mg/dL — ABNORMAL LOW (ref 0.3–1.2)
Total Protein: 6.3 g/dL — ABNORMAL LOW (ref 6.5–8.1)

## 2022-10-18 LAB — CBC WITH DIFFERENTIAL (CANCER CENTER ONLY)
Abs Immature Granulocytes: 0.04 10*3/uL (ref 0.00–0.07)
Basophils Absolute: 0 10*3/uL (ref 0.0–0.1)
Basophils Relative: 0 %
Eosinophils Absolute: 0.2 10*3/uL (ref 0.0–0.5)
Eosinophils Relative: 2 %
HCT: 22.3 % — ABNORMAL LOW (ref 36.0–46.0)
Hemoglobin: 6.9 g/dL — CL (ref 12.0–15.0)
Immature Granulocytes: 0 %
Lymphocytes Relative: 17 %
Lymphs Abs: 1.6 10*3/uL (ref 0.7–4.0)
MCH: 25.6 pg — ABNORMAL LOW (ref 26.0–34.0)
MCHC: 30.9 g/dL (ref 30.0–36.0)
MCV: 82.6 fL (ref 80.0–100.0)
Monocytes Absolute: 0.4 10*3/uL (ref 0.1–1.0)
Monocytes Relative: 5 %
Neutro Abs: 7 10*3/uL (ref 1.7–7.7)
Neutrophils Relative %: 76 %
Platelet Count: 209 10*3/uL (ref 150–400)
RBC: 2.7 MIL/uL — ABNORMAL LOW (ref 3.87–5.11)
RDW: 22.6 % — ABNORMAL HIGH (ref 11.5–15.5)
WBC Count: 9.4 10*3/uL (ref 4.0–10.5)
nRBC: 0 % (ref 0.0–0.2)

## 2022-10-18 LAB — SAMPLE TO BLOOD BANK

## 2022-10-18 LAB — TOTAL PROTEIN, URINE DIPSTICK: Protein, ur: 100 mg/dL — AB

## 2022-10-18 LAB — FERRITIN: Ferritin: 8 ng/mL — ABNORMAL LOW (ref 11–307)

## 2022-10-18 MED ORDER — SODIUM CHLORIDE 0.9 % IV SOLN: INTRAVENOUS | Status: DC

## 2022-10-18 MED ORDER — SODIUM CHLORIDE 0.9 % IV SOLN
5.0000 mg/kg | Freq: Once | INTRAVENOUS | Status: AC
  Administered 2022-10-18: 500 mg via INTRAVENOUS
  Filled 2022-10-18: qty 16

## 2022-10-18 MED ORDER — SODIUM CHLORIDE 0.9% FLUSH
10.0000 mL | INTRAVENOUS | Status: DC | PRN
  Administered 2022-10-18: 10 mL

## 2022-10-18 MED ORDER — SODIUM CHLORIDE 0.9 % IV SOLN: Freq: Once | INTRAVENOUS | Status: AC

## 2022-10-18 MED ORDER — SODIUM CHLORIDE 0.9% FLUSH
10.0000 mL | Freq: Once | INTRAVENOUS | Status: AC
  Administered 2022-10-18: 10 mL

## 2022-10-18 MED ORDER — HEPARIN SOD (PORK) LOCK FLUSH 100 UNIT/ML IV SOLN
500.0000 [IU] | Freq: Once | INTRAVENOUS | Status: AC | PRN
  Administered 2022-10-18: 500 [IU]

## 2022-10-18 MED ORDER — SODIUM CHLORIDE 0.9 % IV SOLN
250.0000 mg | Freq: Once | INTRAVENOUS | Status: AC
  Administered 2022-10-18: 250 mg via INTRAVENOUS
  Filled 2022-10-18: qty 20

## 2022-10-18 NOTE — Progress Notes (Signed)
Per Mosetta Putt, MD, okay to proceed with Bevacizumab after Ferrlecit without waiting for 30 min observation period. No hx adverse s/s.

## 2022-10-18 NOTE — Patient Instructions (Addendum)
[place] [facility]  Discharge Instructions: Thank you for choosing [facility] to provide your oncology and hematology care.   If you have a lab appointment with the Cancer Center, please go directly to the Cancer Center and check in at the registration area.   Wear comfortable clothing and clothing appropriate for easy access to any Portacath or PICC line.   We strive to give you quality time with your provider. You may need to reschedule your appointment if you arrive late (15 or more minutes).  Arriving late affects you and other patients whose appointments are after yours.  Also, if you miss three or more appointments without notifying the office, you may be dismissed from the clinic at the provider's discretion.      For prescription refill requests, have your pharmacy contact our office and allow 72 hours for refills to be completed.    Today you received the following chemotherapy and/or immunotherapy agents: Vegzelma      To help prevent nausea and vomiting after your treatment, we encourage you to take your nausea medication as directed.  BELOW ARE SYMPTOMS THAT SHOULD BE REPORTED IMMEDIATELY: *FEVER GREATER THAN 100.4 F (38 C) OR HIGHER *CHILLS OR SWEATING *NAUSEA AND VOMITING THAT IS NOT CONTROLLED WITH YOUR NAUSEA MEDICATION *UNUSUAL SHORTNESS OF BREATH *UNUSUAL BRUISING OR BLEEDING *URINARY PROBLEMS (pain or burning when urinating, or frequent urination) *BOWEL PROBLEMS (unusual diarrhea, constipation, pain near the anus) TENDERNESS IN MOUTH AND THROAT WITH OR WITHOUT PRESENCE OF ULCERS (sore throat, sores in mouth, or a toothache) UNUSUAL RASH, SWELLING OR PAIN  UNUSUAL VAGINAL DISCHARGE OR ITCHING   Items with * indicate a potential emergency and should be followed up as soon as possible or go to the Emergency Department if any problems should occur.  Please show the CHEMOTHERAPY ALERT CARD or IMMUNOTHERAPY ALERT CARD at  check-in to the Emergency Department and triage nurse.  Should you have questions after your visit or need to cancel or reschedule your appointment, please contact [place] [facility]  Dept: [phone]  and follow the prompts.  Office hours are 8:00 a.m. to 4:30 p.m. Monday - Friday. Please note that voicemails left after 4:00 p.m. may not be returned until the following business day.  We are closed weekends and major holidays. You have access to a nurse at all times for urgent questions. Please call the main number to the clinic Dept: [phone] and follow the prompts.   For any non-urgent questions, you may also contact your provider using MyChart. We now offer e-Visits for anyone 43 and older to request care online for non-urgent symptoms. For details visit [email or web address].   Also download the MyChart app! Go to the app store, search "MyChart", open the app, select [facility], and log in with your MyChart username and password.  Sodium Ferric Gluconate Complex Injection What is this medication? SODIUM FERRIC GLUCONATE COMPLEX (SOE dee um FER ik GLOO koe nate KOM pleks) treats low levels of iron (iron deficiency anemia) in people with kidney disease. Iron is a mineral that plays an important role in making red blood cells, which carry oxygen from your lungs to the rest of your body. This medicine may be used for other purposes; ask your health care provider or pharmacist if you have questions. COMMON BRAND NAME(S): Ferrlecit, Nulecit What should I tell my care team before I take this medication? They need to know if you have any  of the following conditions: Anemia that is not from iron deficiency High levels of iron in the blood An unusual or allergic reaction to iron, other medications, foods, dyes, or preservatives Pregnant or are trying to become pregnant Breast-feeding How should I use this medication? This medication is injected into a vein. It  is given by your care team in a hospital or clinic setting. Talk to your care team about the use of this medication in children. While it may be prescribed for children as young as 6 years for selected conditions, precautions do apply. Overdosage: If you think you have taken too much of this medicine contact a poison control center or emergency room at once. NOTE: This medicine is only for you. Do not share this medicine with others. What if I miss a dose? It is important not to miss your dose. Call your care team if you are unable to keep an appointment. What may interact with this medication? Do not take this medication with any of the following: Deferasirox Deferoxamine Dimercaprol This medication may also interact with the following: Other iron products This list may not describe all possible interactions. Give your health care provider a list of all the medicines, herbs, non-prescription drugs, or dietary supplements you use. Also tell them if you smoke, drink alcohol, or use illegal drugs. Some items may interact with your medicine. What should I watch for while using this medication? Your condition will be monitored carefully while you are receiving this medication. Visit your care team for regular checks on your progress. You may need blood work while you are taking this medication. What side effects may I notice from receiving this medication? Side effects that you should report to your care team as soon as possible: Allergic reactions--skin rash, itching, hives, swelling of the face, lips, tongue, or throat Low blood pressure--dizziness, feeling faint or lightheaded, blurry vision Shortness of breath Side effects that usually do not require medical attention (report to your care team if they continue or are bothersome): Flushing Headache Joint pain Muscle pain Nausea Pain, redness, or irritation at injection site This list may not describe all possible side effects. Call your  doctor for medical advice about side effects. You may report side effects to FDA at 1-800-FDA-1088. Where should I keep my medication? This medication is given in a hospital or clinic and will not be stored at home. NOTE: This sheet is a summary. It may not cover all possible information. If you have questions about this medicine, talk to your doctor, pharmacist, or health care provider.  2024 Elsevier/Gold Standard (2020-08-25 00:00:00)

## 2022-10-18 NOTE — Telephone Encounter (Signed)
CRITICAL VALUE STICKER  CRITICAL VALUE: Hgb 6.9  RECEIVER (on-site recipient of call): Sharlette Dense CMA  DATE & TIME NOTIFIED: 10/18/2022 1420  MESSENGER (representative from lab): Diane in Lab  MD NOTIFIED: Dr. Mosetta Putt  TIME OF NOTIFICATION: 1425  RESPONSE: Made doctor aware

## 2022-10-18 NOTE — Addendum Note (Signed)
Addended by: Geronimo Boot T on: 10/18/2022 04:20 PM   Modules accepted: Orders

## 2022-10-18 NOTE — Addendum Note (Signed)
Addended by: Joella Prince on: 10/18/2022 05:45 PM   Modules accepted: Orders

## 2022-10-18 NOTE — Progress Notes (Signed)
Pt's hemoglobin is 6.9, other labs pending. Dr. Mosetta Putt made aware. New order received to proceed w/ bevacizumab treatment today and to give pt iron as per [facility] indicates. Pt made aware of her appt on 10/24/22 to receive blood and iron.

## 2022-10-24 ENCOUNTER — Inpatient Hospital Stay: Payer: Medicaid Other

## 2022-10-24 ENCOUNTER — Telehealth: Payer: Self-pay | Admitting: Hematology

## 2022-10-25 ENCOUNTER — Ambulatory Visit: Payer: Medicaid Other

## 2022-10-25 ENCOUNTER — Other Ambulatory Visit: Payer: Medicaid Other

## 2022-10-31 ENCOUNTER — Inpatient Hospital Stay: Payer: Medicaid Other

## 2022-10-31 ENCOUNTER — Other Ambulatory Visit: Payer: Self-pay

## 2022-10-31 VITALS — BP 137/66 | HR 56 | Temp 98.1°F | Resp 17 | Ht 65.0 in | Wt 224.0 lb

## 2022-10-31 DIAGNOSIS — I780 Hereditary hemorrhagic telangiectasia: Secondary | ICD-10-CM

## 2022-10-31 DIAGNOSIS — Z95828 Presence of other vascular implants and grafts: Secondary | ICD-10-CM

## 2022-10-31 DIAGNOSIS — D500 Iron deficiency anemia secondary to blood loss (chronic): Secondary | ICD-10-CM

## 2022-10-31 DIAGNOSIS — D649 Anemia, unspecified: Secondary | ICD-10-CM

## 2022-10-31 LAB — CBC WITH DIFFERENTIAL (CANCER CENTER ONLY)
Abs Immature Granulocytes: 0.04 10*3/uL (ref 0.00–0.07)
Basophils Absolute: 0.1 10*3/uL (ref 0.0–0.1)
Basophils Relative: 1 %
Eosinophils Absolute: 0.1 10*3/uL (ref 0.0–0.5)
Eosinophils Relative: 1 %
HCT: 20.2 % — ABNORMAL LOW (ref 36.0–46.0)
Hemoglobin: 5.9 g/dL — CL (ref 12.0–15.0)
Immature Granulocytes: 0 %
Lymphocytes Relative: 16 %
Lymphs Abs: 1.5 10*3/uL (ref 0.7–4.0)
MCH: 24.5 pg — ABNORMAL LOW (ref 26.0–34.0)
MCHC: 29.2 g/dL — ABNORMAL LOW (ref 30.0–36.0)
MCV: 83.8 fL (ref 80.0–100.0)
Monocytes Absolute: 0.6 10*3/uL (ref 0.1–1.0)
Monocytes Relative: 6 %
Neutro Abs: 7.2 10*3/uL (ref 1.7–7.7)
Neutrophils Relative %: 76 %
Platelet Count: 411 10*3/uL — ABNORMAL HIGH (ref 150–400)
RBC: 2.41 MIL/uL — ABNORMAL LOW (ref 3.87–5.11)
RDW: 24.1 % — ABNORMAL HIGH (ref 11.5–15.5)
WBC Count: 9.5 10*3/uL (ref 4.0–10.5)
nRBC: 0 % (ref 0.0–0.2)

## 2022-10-31 LAB — FERRITIN: Ferritin: 24 ng/mL (ref 11–307)

## 2022-10-31 LAB — CMP (CANCER CENTER ONLY)
ALT: <5 U/L (ref 0–44)
AST: 8 U/L — ABNORMAL LOW (ref 15–41)
Albumin: 3.8 g/dL (ref 3.5–5.0)
Alkaline Phosphatase: 70 U/L (ref 38–126)
Anion gap: 8 (ref 5–15)
BUN: 10 mg/dL (ref 8–23)
CO2: 24 mmol/L (ref 22–32)
Calcium: 9 mg/dL (ref 8.9–10.3)
Chloride: 108 mmol/L (ref 98–111)
Creatinine: 0.92 mg/dL (ref 0.44–1.00)
GFR, Estimated: >60 mL/min (ref >60)
Glucose, Bld: 101 mg/dL — ABNORMAL HIGH (ref 70–99)
Potassium: 3.6 mmol/L (ref 3.5–5.1)
Sodium: 140 mmol/L (ref 135–145)
Total Bilirubin: 0.3 mg/dL (ref 0.3–1.2)
Total Protein: 7 g/dL (ref 6.5–8.1)

## 2022-10-31 LAB — PREPARE RBC (CROSSMATCH)

## 2022-10-31 LAB — SAMPLE TO BLOOD BANK

## 2022-10-31 MED ORDER — DIPHENHYDRAMINE HCL 25 MG PO CAPS
25.0000 mg | ORAL_CAPSULE | Freq: Once | ORAL | Status: AC
  Administered 2022-10-31: 25 mg via ORAL
  Filled 2022-10-31: qty 1

## 2022-10-31 MED ORDER — SODIUM CHLORIDE 0.9 % IV SOLN: Freq: Once | INTRAVENOUS | Status: AC

## 2022-10-31 MED ORDER — DIPHENHYDRAMINE HCL 25 MG PO CAPS: 25.0000 mg | ORAL_CAPSULE | Freq: Once | ORAL | Status: DC

## 2022-10-31 MED ORDER — SODIUM CHLORIDE 0.9% IV SOLUTION
250.0000 mL | Freq: Once | INTRAVENOUS | Status: AC
  Administered 2022-10-31: 250 mL via INTRAVENOUS

## 2022-10-31 MED ORDER — SODIUM CHLORIDE 0.9% FLUSH
10.0000 mL | Freq: Once | INTRAVENOUS | Status: AC
  Administered 2022-10-31: 10 mL

## 2022-10-31 MED ORDER — ACETAMINOPHEN 325 MG PO TABS
650.0000 mg | ORAL_TABLET | Freq: Once | ORAL | Status: AC
  Administered 2022-10-31: 650 mg via ORAL
  Filled 2022-10-31: qty 2

## 2022-10-31 MED ORDER — SODIUM CHLORIDE 0.9% FLUSH
10.0000 mL | INTRAVENOUS | Status: DC | PRN
  Administered 2022-10-31: 10 mL

## 2022-10-31 MED ORDER — HEPARIN SOD (PORK) LOCK FLUSH 100 UNIT/ML IV SOLN
500.0000 [IU] | Freq: Once | INTRAVENOUS | Status: AC | PRN
  Administered 2022-10-31: 500 [IU]

## 2022-10-31 MED ORDER — SODIUM CHLORIDE 0.9 % IV SOLN
5.0000 mg/kg | Freq: Once | INTRAVENOUS | Status: AC
  Administered 2022-10-31: 500 mg via INTRAVENOUS
  Filled 2022-10-31: qty 16

## 2022-10-31 NOTE — Patient Instructions (Signed)
Call us to reschedule your Iron infusion  [facility]  Discharge Instructions: Thank you for choosing [facility] to provide your oncology and hematology care.   If you have a lab appointment with the Cancer Center, please go directly to the Cancer Center and check in at the registration area.   Wear comfortable clothing and clothing appropriate for easy access to any Portacath or PICC line.   We strive to give you quality time with your provider. You may need to reschedule your appointment if you arrive late (15 or more minutes).  Arriving late affects you and other patients whose appointments are after yours.  Also, if you miss three or more appointments without notifying the office, you may be dismissed from the clinic at the provider's discretion.      For prescription refill requests, have your pharmacy contact our office and allow 72 hours for refills to be completed.    Today you received the following chemotherapy and/or immunotherapy agents Bevacizumab      To help prevent nausea and vomiting after your treatment, we encourage you to take your nausea medication as directed.  BELOW ARE SYMPTOMS THAT SHOULD BE REPORTED IMMEDIATELY: *FEVER GREATER THAN 100.4 F (38 C) OR HIGHER *CHILLS OR SWEATING *NAUSEA AND VOMITING THAT IS NOT CONTROLLED WITH YOUR NAUSEA MEDICATION *UNUSUAL SHORTNESS OF BREATH *UNUSUAL BRUISING OR BLEEDING *URINARY PROBLEMS (pain or burning when urinating, or frequent urination) *BOWEL PROBLEMS (unusual diarrhea, constipation, pain near the anus) TENDERNESS IN MOUTH AND THROAT WITH OR WITHOUT PRESENCE OF ULCERS (sore throat, sores in mouth, or a toothache) UNUSUAL RASH, SWELLING OR PAIN  UNUSUAL VAGINAL DISCHARGE OR ITCHING   Items with * indicate a potential emergency and should be followed up as soon as possible or go to the Emergency Department if any problems should occur.  Please show the CHEMOTHERAPY  ALERT CARD or IMMUNOTHERAPY ALERT CARD at check-in to the Emergency Department and triage nurse.  Should you have questions after your visit or need to cancel or reschedule your appointment, please contact [facility]  Dept: [phone]  and follow the prompts.  Office hours are 8:00 a.m. to 4:30 p.m. Monday - Friday. Please note that voicemails left after 4:00 p.m. may not be returned until the following business day.  We are closed weekends and major holidays. You have access to a nurse at all times for urgent questions. Please call the main number to the clinic Dept: [phone] and follow the prompts.   For any non-urgent questions, you may also contact your provider using MyChart. We now offer e-Visits for anyone 69 and older to request care online for non-urgent symptoms. For details visit [email or web address].   Also download the MyChart app! Go to the app store, search "MyChart", open the app, select [facility], and log in with your MyChart username and password.    Blood Transfusion, Adult A blood transfusion is a procedure in which you receive blood through an IV tube. You may need this procedure because of: A bleeding disorder. An illness. An injury. A surgery. The blood may come from someone else (a donor). You may also be able to donate blood for yourself before a surgery. The blood given in a transfusion may be made up of different types of cells. You may get: Red blood cells. These carry oxygen to the cells in the body. Platelets. These help your blood to clot. Plasma. This is  the liquid part of your blood. It carries proteins and other substances through the body. White blood cells. These help you fight infections. If you have a clotting disorder, you may also get other types of blood products. Depending on the type of blood product, this procedure may take 1-4 hours to complete. Tell your doctor about: Any bleeding problems you  have. Any reactions you have had during a blood transfusion in the past. Any allergies you have. All medicines you are taking, including vitamins, herbs, eye drops, creams, and over-the-counter medicines. Any surgeries you have had. Any medical conditions you have. Whether you are pregnant or may be pregnant. What are the risks? Talk with your health care provider about risks. The most common problems include: A mild allergic reaction. This includes red, swollen areas of skin (hives) and itching. Fever or chills. This may be the body's response to new blood cells received. This may happen during or up to 4 hours after the transfusion. More serious problems may include: A serious allergic reaction. This includes breathing trouble or swelling around the face and lips. Too much fluid in the lungs. This may cause breathing problems. Lung injury. This causes breathing trouble and low oxygen in the blood. This can happen within hours of the transfusion or days later. Too much iron. This can happen after getting many blood transfusions over a period of time. An infection or virus passed through the blood. This is rare. Donated blood is carefully tested before it is given. Your body's defense system (immune system) trying to attack the new blood cells. This is rare. Symptoms may include fever, chills, nausea, low blood pressure, and low back or chest pain. Donated cells attacking healthy tissues. This is rare. What happens before the procedure? You will have a blood test to find out your blood type. The test also finds out what type of blood your body will accept and matches it to the donor type. If you are going to have a planned surgery, you may be able to donate your own blood. This may be done in case you need a transfusion. You will have your temperature, blood pressure, and pulse checked. You may receive medicine to help prevent an allergic reaction. This may be done if you have had a reaction  to a transfusion before. This medicine may be given to you by mouth or through an IV tube. What happens during the procedure?  An IV tube will be put into one of your veins. The bag of blood will be attached to your IV tube. Then, the blood will enter through your vein. Your temperature, blood pressure, and pulse will be checked often. This is done to find early signs of a transfusion reaction. Tell your nurse right away if you have any of these symptoms: Shortness of breath or trouble breathing. Chest or back pain. Fever or chills. Red, swollen areas of skin or itching. If you have any signs or symptoms of a reaction, your transfusion will be stopped. You may also be given medicine. When the transfusion is finished, your IV tube will be taken out. Pressure may be put on the IV site for a few minutes. A bandage (dressing) will be put on the IV site. The procedure may vary among doctors and hospitals. What happens after the procedure? You will be monitored until you leave the hospital or clinic. This includes checking your temperature, blood pressure, pulse, breathing rate, and blood oxygen level. Your blood may be tested to see  how you have responded to the transfusion. You may be warmed with fluids or blankets. This is done to keep the temperature of your body normal. If you have your procedure in an outpatient setting, you will be told whom to contact to report any reactions. Where to find more information Visit the [employer]: redcross.org Summary A blood transfusion is a procedure in which you receive blood through an IV tube. The blood you are given may be made up of different blood cells. You may receive red blood cells, platelets, plasma, or white blood cells. Your temperature, blood pressure, and pulse will be checked often. After the procedure, your blood may be tested to see how you have responded. This information is not intended to replace advice given to you by your  health care provider. Make sure you discuss any questions you have with your health care provider. Document Revised: 06/29/2021 Document Reviewed: 06/29/2021 Elsevier Patient Education  2024 [employer].

## 2022-10-31 NOTE — Progress Notes (Signed)
Blood and Bevacizumab today. Ferrlecit to be scheduled for 7/19. Pt unsure about transportation for 7/19.   Anola Gurney [place], [place], BCPS, BCOP 10/31/2022 2:50 PM

## 2022-10-31 NOTE — Progress Notes (Signed)
Ok to proceed with bevacizumab despite low hgb per Dr. Mosetta Putt.  Demetrius Charity, PharmD

## 2022-10-31 NOTE — Progress Notes (Signed)
Verbal order given to administer 1 unit of PRBC form Dr.Feng, spoke with [place] in blood bank type and screen order received, patient will have 1 unit of PRBC transfused on 10/31/2022

## 2022-11-01 ENCOUNTER — Other Ambulatory Visit: Payer: Self-pay | Admitting: Hematology

## 2022-11-01 LAB — TYPE AND SCREEN
ABO/RH(D): A NEG
Antibody Screen: NEGATIVE
Unit division: 0

## 2022-11-01 LAB — BPAM RBC
Blood Product Expiration Date: 202408142359
ISSUE DATE / TIME: 202407181520
Unit Type and Rh: 600

## 2022-11-04 ENCOUNTER — Encounter: Payer: Self-pay | Admitting: Hematology

## 2022-11-04 ENCOUNTER — Other Ambulatory Visit: Payer: Self-pay

## 2022-11-04 DIAGNOSIS — D500 Iron deficiency anemia secondary to blood loss (chronic): Secondary | ICD-10-CM

## 2022-11-04 DIAGNOSIS — I780 Hereditary hemorrhagic telangiectasia: Secondary | ICD-10-CM

## 2022-11-14 ENCOUNTER — Inpatient Hospital Stay: Payer: Medicaid Other | Attending: Hematology

## 2022-11-14 ENCOUNTER — Telehealth: Payer: Self-pay | Admitting: Hematology

## 2022-11-14 ENCOUNTER — Inpatient Hospital Stay: Payer: Medicaid Other

## 2022-11-14 ENCOUNTER — Inpatient Hospital Stay: Payer: Medicaid Other | Admitting: Hematology

## 2022-11-14 DIAGNOSIS — I780 Hereditary hemorrhagic telangiectasia: Secondary | ICD-10-CM | POA: Insufficient documentation

## 2022-11-14 DIAGNOSIS — D500 Iron deficiency anemia secondary to blood loss (chronic): Secondary | ICD-10-CM | POA: Insufficient documentation

## 2022-11-14 NOTE — Assessment & Plan Note (Deleted)
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

## 2022-11-14 NOTE — Progress Notes (Deleted)
[facility]   Telephone:(336) [phone] Fax:(336) [phone]   Clinic Follow up Note   Patient Care Team: Olegario Messier, MD as PCP - Serita Grit, MD as Attending Physician (Hematology and Oncology)  Date of Service:  11/14/2022  CHIEF COMPLAINT: f/u of HHT and anemia    CURRENT THERAPY:  -Blood transfusion for Hg<8.0 (2units on different days if Hg<7) -iv ferric gluconate, with ferritin goal 100-200.  -Avastin/Zirabev restarted on 04/03/18. Currently given PRN due to proteinuria. -Amicar 1g BID, intermittently since 07/17/18    ASSESSMENT: *** Peggy House is a 62 y.o. female with   No problem-specific Assessment & Plan notes found for this encounter.  ***   PLAN:    SUMMARY OF ONCOLOGIC HISTORY: Oncology History   No history exists.     INTERVAL HISTORY: *** Peggy House is here for a follow up of HHT and anemia. She was last seen by me on 09/19/2022. She presents to clinic    All other systems were reviewed with the patient and are negative.  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    SURGICAL HISTORY: Past Surgical History:  Procedure Laterality Date   ABDOMINAL HYSTERECTOMY     CARPAL TUNNEL RELEASE  05/13/2011   Procedure: CARPAL TUNNEL RELEASE;  Surgeon: Mable Paris, MD;  Location: [facility];  Service: Orthopedics;  Laterality: Left;   COLONOSCOPY N/A 03/02/2020   Procedure: COLONOSCOPY;  Surgeon: Bernette Redbird, MD;  Location: [facility];  Service: Endoscopy;  Laterality: N/A;   COLONOSCOPY WITH PROPOFOL N/A 04/28/2014    Procedure: COLONOSCOPY WITH PROPOFOL;  Surgeon: Florencia Reasons, MD;  Location: [facility] ENDOSCOPY;  Service: Endoscopy;  Laterality: N/A;   DG TOES*L*  2/10   rt   DILATION AND CURETTAGE OF UTERUS     ENTEROSCOPY N/A 10/17/2017   Procedure: ENTEROSCOPY;  Surgeon: Kathi Der, MD;  Location: [facility];  Service: Gastroenterology;  Laterality: N/A;   ESOPHAGOGASTRODUODENOSCOPY N/A 04/10/2014   Procedure: ESOPHAGOGASTRODUODENOSCOPY (EGD);  Surgeon: Shirley Friar, MD;  Location: [facility] ENDOSCOPY;  Service: Endoscopy;  Laterality: N/A;   ESOPHAGOGASTRODUODENOSCOPY N/A 05/10/2017   Procedure: ESOPHAGOGASTRODUODENOSCOPY (EGD);  Surgeon: Bernette Redbird, MD;  Location: [facility] ENDOSCOPY;  Service: Endoscopy;  Laterality: N/A;   ESOPHAGOGASTRODUODENOSCOPY N/A 09/22/2017   Procedure: ESOPHAGOGASTRODUODENOSCOPY (EGD);  Surgeon: Vida Rigger, MD;  Location: [facility] ENDOSCOPY;  Service: Endoscopy;  Laterality: N/A;  bedside   ESOPHAGOGASTRODUODENOSCOPY N/A 03/02/2020   Procedure: ESOPHAGOGASTRODUODENOSCOPY (EGD);  Surgeon: Bernette Redbird, MD;  Location: [facility];  Service: Endoscopy;  Laterality: N/A;   ESOPHAGOGASTRODUODENOSCOPY N/A 11/03/2020   Procedure: ESOPHAGOGASTRODUODENOSCOPY (EGD);  Surgeon: Charlott Rakes, MD;  Location: [facility] ENDOSCOPY;  Service: Endoscopy;  Laterality: N/A;   ESOPHAGOGASTRODUODENOSCOPY N/A 04/12/2022   Procedure: ESOPHAGOGASTRODUODENOSCOPY (EGD);  Surgeon: Vida Rigger, MD;  Location: [facility] ENDOSCOPY;  Service: Gastroenterology;  Laterality: N/A;   ESOPHAGOGASTRODUODENOSCOPY N/A 05/27/2022   Procedure: ESOPHAGOGASTRODUODENOSCOPY (EGD);  Surgeon: Willis Modena, MD;  Location: [facility] ENDOSCOPY;  Service: Gastroenterology;  Laterality: N/A;   ESOPHAGOGASTRODUODENOSCOPY N/A 09/27/2022   Procedure: ESOPHAGOGASTRODUODENOSCOPY (EGD);  Surgeon: Lynann Bologna, DO;  Location: [facility] ENDOS[facility];  Service: Gastroenterology;  Laterality: N/A;   ESOPHAGOGASTRODUODENOSCOPY (EGD) WITH PROPOFOL N/A  04/27/2014   Procedure: ESOPHAGOGASTRODUODENOSCOPY (EGD) WITH PROPOFOL;  Surgeon: Florencia Reasons, MD;  Location: [facility] ENDOSCOPY;  Service: Endoscopy;  Laterality: N/A;  possible apc   ESOPHAGOGASTRODUODENOSCOPY (EGD) WITH PROPOFOL N/A 09/30/2017   Procedure: ESOPHAGOGASTRODUODENOSCOPY (EGD) WITH PROPOFOL;  Surgeon: Kerin Salen, MD;  Location: [facility] ENDOSCOPY;  Service: Gastroenterology;  Laterality: N/A;   ESOPHAGOGASTRODUODENOSCOPY (EGD) WITH PROPOFOL N/A 10/01/2017   Procedure: ESOPHAGOGASTRODUODENOSCOPY (EGD) WITH PROPOFOL;  Surgeon: Kerin Salen, MD;  Location: [facility] ENDOSCOPY;  Service: Gastroenterology;  Laterality: N/A;   ESOPHAGOGASTRODUODENOSCOPY (EGD) WITH PROPOFOL N/A 10/08/2017   Procedure: ESOPHAGOGASTRODUODENOSCOPY (EGD) WITH PROPOFOL;  Surgeon: Kathi Der, MD;  Location: [facility];  Service: Gastroenterology;  Laterality: N/A;   ESOPHAGOGASTRODUODENOSCOPY (EGD) WITH PROPOFOL N/A 10/17/2017   Procedure: ESOPHAGOGASTRODUODENOSCOPY (EGD) WITH PROPOFOL;  Surgeon: Kathi Der, MD;  Location: [facility];  Service: Gastroenterology;  Laterality: N/A;   ESOPHAGOGASTRODUODENOSCOPY (EGD) WITH PROPOFOL N/A 10/19/2017   Procedure: ESOPHAGOGASTRODUODENOSCOPY (EGD) WITH PROPOFOL;  Surgeon: Kathi Der, MD;  Location: [facility];  Service: Gastroenterology;  Laterality: N/A;   ESOPHAGOGASTRODUODENOSCOPY (EGD) WITH PROPOFOL N/A 12/04/2018   Procedure: ESOPHAGOGASTRODUODENOSCOPY (EGD) WITH PROPOFOL;  Surgeon: Charlott Rakes, MD;  Location: [facility];  Service: Endoscopy;  Laterality: N/A;   GIVENS CAPSULE STUDY N/A 10/02/2017   Procedure: GIVENS CAPSULE STUDY;  Surgeon: Kerin Salen, MD;  Location: [facility] ENDOSCOPY;  Service: Gastroenterology;  Laterality: N/A;   GIVENS CAPSULE STUDY N/A 10/08/2017   Procedure: GIVENS CAPSULE STUDY;  Surgeon: Kathi Der, MD;  Location: [facility]OS[facility];  Service: Gastroenterology;  Laterality: N/A;  endoscopic placement of capsule   GIVENS CAPSULE STUDY  N/A 03/02/2020   Procedure: GIVENS CAPSULE STUDY;  Surgeon: Bernette Redbird, MD;  Location: [facility];  Service: Endoscopy;  Laterality: N/A;   HEMOSTASIS CLIP PLACEMENT  12/04/2018   Procedure: HEMOSTASIS CLIP PLACEMENT;  Surgeon: Charlott Rakes, MD;  Location: [facility];  Service: Endoscopy;;   HEMOSTASIS CLIP PLACEMENT  05/27/2022   Procedure: HEMOSTASIS CLIP PLACEMENT;  Surgeon: Willis Modena, MD;  Location: [facility];  Service: Gastroenterology;;   HEMOSTASIS CLIP PLACEMENT  09/27/2022   Procedure: HEMOSTASIS CLIP PLACEMENT;  Surgeon: Lynann Bologna, DO;  Location: [facility] ENDOSCOPY;  Service: Gastroenterology;;   HEMOSTASIS CONTROL  05/27/2022   Procedure: HEMOSTASIS CONTROL;  Surgeon: Willis Modena, MD;  Location: [facility];  Service: Gastroenterology;;   HOT HEMOSTASIS N/A 04/27/2014   Procedure: HOT HEMOSTASIS (ARGON PLASMA COAGULATION/BICAP);  Surgeon: Florencia Reasons, MD;  Location: [facility] ENDOSCOPY;  Service: Endoscopy;  Laterality: N/A;   HOT HEMOSTASIS N/A 09/30/2017   Procedure: HOT HEMOSTASIS (ARGON PLASMA COAGULATION/BICAP);  Surgeon: Kerin Salen, MD;  Location: [facility] ENDOSCOPY;  Service: Gastroenterology;  Laterality: N/A;   HOT HEMOSTASIS N/A 10/01/2017   Procedure: HOT HEMOSTASIS (ARGON PLASMA COAGULATION/BICAP);  Surgeon: Kerin Salen, MD;  Location: [facility] ENDOSCOPY;  Service: Gastroenterology;  Laterality: N/A;   HOT HEMOSTASIS N/A 10/17/2017   Procedure: HOT HEMOSTASIS (ARGON PLASMA COAGULATION/BICAP);  Surgeon: Kathi Der, MD;  Location: [facility] ENDOSCOPY;  Service: Gastroenterology;  Laterality: N/A;   HOT HEMOSTASIS N/A 10/19/2017   Procedure: HOT HEMOSTASIS (ARGON PLASMA COAGULATION/BICAP);  Surgeon: Kathi Der, MD;  Location: [facility] ENDOSCOPY;  Service: Gastroenterology;  Laterality: N/A;   HOT HEMOSTASIS N/A 03/02/2020   Procedure: HOT HEMOSTASIS (ARGON PLASMA COAGULATION/BICAP);  Surgeon: Bernette Redbird, MD;  Location: [facility]COPY;  Service: Endoscopy;   Laterality: N/A;   HOT HEMOSTASIS N/A 04/12/2022   Procedure: HOT HEMOSTASIS (  ARGON PLASMA COAGULATION/BICAP);  Surgeon: Vida Rigger, MD;  Location: [facility]COPY;  Service: Gastroenterology;  Laterality: N/A;   IR IMAGING GUIDED PORT INSERTION  07/08/2018   L shoulder Surgery  2011   POLYPECTOMY  03/02/2020   Procedure: POLYPECTOMY;  Surgeon: Bernette Redbird, MD;  Location: [facility];  Service: Endoscopy;;   SCLEROTHERAPY  11/03/2020   Procedure: Susa Day;  Surgeon: Charlott Rakes, MD;  Location: [facility] ENDOSCOPY;  Service: Endoscopy;;   SUBMUCOSAL INJECTION  09/22/2017   Procedure: SUBMUCOSAL INJECTION;  Surgeon: Vida Rigger, MD;  Location: [facility] ENDOSCOPY;  Service: Endoscopy;;   SUBMUCOSAL INJECTION  12/04/2018   Procedure: SUBMUCOSAL INJECTION;  Surgeon: Charlott Rakes, MD;  Location: [facility] ENDOS[facility];  Service: Endoscopy;;    I have reviewed the social history and family history with the patient and they are unchanged from previous note.  ALLERGIES:  is allergic to feraheme [ferumoxytol], nsaids, tomato, iron (ferrous sulfate) [ferrous sulfate er], and wasp venom.  MEDICATIONS:  Current Outpatient Medications  Medication Sig Dispense Refill   Accu-Chek Softclix Lancets lancets Use to check blood sugar before breakfast and before dinner while on steroids (Patient taking differently: 1 each by Other route See admin instructions. Use to check blood sugar before breakfast and before dinner while on steroids) 100 each 1   ALPRAZolam (XANAX) 0.5 MG tablet TAKE 1 TABLET BY MOUTH AT BEDTIME AS NEEDED FOR ANXIETY OR SLEEP 30 tablet 0   Aminocaproic Acid 1000 MG TABS Take 1 tablet (1,000 mg total) by mouth in the morning and at bedtime. 60 tablet 2   amLODipine (NORVASC) 10 MG tablet TAKE 1 TABLET(10 MG) BY MOUTH DAILY (Patient taking differently: Take 10 mg by mouth daily.) 90 tablet 3   citalopram (CELEXA) 20 MG tablet Take 1 tablet (20 mg total) by mouth daily. 90 tablet 3   glucose blood  (ACCU-CHEK GUIDE) test strip Check blood sugar 2 times per day while on steroids before breakfast and dinner (Patient taking differently: 1 each by Other route See admin instructions. Check blood sugar 2 times per day while on steroids before breakfast and dinner) 50 each 3   lidocaine-prilocaine (EMLA) cream Apply 1 application topically as needed. (Patient not taking: Reported on 09/26/2022) 30 g 0   lisinopril (ZESTRIL) 20 MG tablet Take 1 tablet (20 mg total) by mouth daily. 90 tablet 3   metFORMIN (GLUCOPHAGE) 500 MG tablet TAKE 1 TABLET(500 MG) BY MOUTH TWICE DAILY WITH A MEAL (Patient taking differently: Take 500 mg by mouth 2 (two) times daily with a meal.) 180 tablet 3   nicotine (NICODERM CQ - DOSED IN MG/24 HOURS) 14 mg/24hr patch Place 1 patch (14 mg total) onto the skin daily. (Patient not taking: Reported on 09/26/2022) 30 patch 3   ondansetron (ZOFRAN) 4 MG tablet Take 1 tablet (4 mg total) by mouth every 8 (eight) hours as needed for nausea or vomiting. 20 tablet 0   pantoprazole (PROTONIX) 40 MG tablet TAKE 1 TABLET(40 MG) BY MOUTH TWICE DAILY 180 tablet 1   PROAIR HFA 108 (90 Base) MCG/ACT inhaler INHALE 1 TO 2 PUFFS INTO THE LUNGS EVERY 6 HOURS AS NEEDED FOR WHEEZING OR SHORTNESS OF BREATH (Patient taking differently: Inhale 1-2 puffs into the lungs every 6 (six) hours as needed for wheezing or shortness of breath.) 8.5 g 0   traZODone (DESYREL) 100 MG tablet TAKE 1 TABLET(100 MG) BY MOUTH AT BEDTIME (Patient taking differently: Take 100 mg by mouth at bedtime. TAKE 1 TABLET(100 MG) BY MOUTH AT  BEDTIME) 30 tablet 2   TYLENOL PM EXTRA STRENGTH 500-25 MG TABS tablet Take 2 tablets by mouth at bedtime as needed (for sleep).     No current facility-administered medications for this visit.   Facility-Administered Medications Ordered in Other Visits  Medication Dose Route Frequency Provider Last Rate Last Admin   diphenhydrAMINE (BENADRYL) 25 mg capsule            diphenhydrAMINE  (BENADRYL) 25 mg capsule            phenylephrine (NEO-SYNEPHRINE) 1 % nasal drops 2 drop  2 drop Left Nare Once Malachy Mood, MD        PHYSICAL EXAMINATION: ECOG PERFORMANCE STATUS: {CHL ONC ECOG JX:[id number]}  There were no vitals filed for this visit. Wt Readings from Last 3 Encounters:  10/31/22 224 lb (101.6 kg)  10/18/22 227 lb 12.8 oz (103.3 kg)  09/25/22 225 lb (102.1 kg)    {Only keep what was examined. If exam not performed, can use .CEXAM } GENERAL:alert, no distress and comfortable SKIN: skin color, texture, turgor are normal, no rashes or significant lesions EYES: normal, Conjunctiva are pink and non-injected, sclera clear {OROPHARYNX:no exudate, no erythema and lips, buccal mucosa, and tongue normal}  NECK: supple, thyroid normal size, non-tender, without nodularity LYMPH:  no palpable lymphadenopathy in the cervical, axillary {or inguinal} LUNGS: clear to auscultation and percussion with normal breathing effort HEART: regular rate & rhythm and no murmurs and no lower extremity edema ABDOMEN:abdomen soft, non-tender and normal bowel sounds Musculoskeletal:no cyanosis of digits and no clubbing  NEURO: alert & oriented x 3 with fluent speech, no focal motor/sensory deficits  LABORATORY DATA:  I have reviewed the data as listed    Latest Ref Rng & Units 10/31/2022   12:57 PM 10/18/2022    1:55 PM 09/28/2022    5:53 AM  CBC  WBC 4.0 - 10.5 K/uL 9.5  9.4  9.6   Hemoglobin 12.0 - 15.0 g/dL 5.9  6.9  8.2   Hematocrit 36.0 - 46.0 % 20.2  22.3  26.5   Platelets 150 - 400 K/uL 411  209  180         Latest Ref Rng & Units 10/31/2022   12:57 PM 10/18/2022    1:55 PM 09/28/2022    5:53 AM  CMP  Glucose 70 - 99 mg/dL 130  94  865   BUN 8 - 23 mg/dL 10  8  11    Creatinine 0.44 - 1.00 mg/dL 7.84  6.96  2.95   Sodium 135 - 145 mmol/L 140  140  139   Potassium 3.5 - 5.1 mmol/L 3.6  3.6  3.5   Chloride 98 - 111 mmol/L 108  108  105   CO2 22 - 32 mmol/L 24  24  22    Calcium 8.9  - 10.3 mg/dL 9.0  9.2  8.8   Total Protein 6.5 - 8.1 g/dL 7.0  6.3    Total Bilirubin 0.3 - 1.2 mg/dL 0.3  0.2    Alkaline Phos 38 - 126 U/L 70  72    AST 15 - 41 U/L 8  11    ALT 0 - 44 U/L <5  6        RADIOGRAPHIC STUDIES: I have personally reviewed the radiological images as listed and agreed with the findings in the report. No results found.    No orders of the defined types were placed in this encounter.  All questions were answered. The  patient knows to call the clinic with any problems, questions or concerns. No barriers to learning was detected. The total time spent in the appointment was {CHL ONC TIME VISIT - WNIOE:[id number]}.     Verlee Rossetti, CMA 11/14/2022   I, Sharlette Dense, CMA, am acting as scribe for Malachy Mood, MD.   {Add scribe attestation statement}

## 2022-11-14 NOTE — Assessment & Plan Note (Deleted)
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 -she has required multiple blood transfusions and APCs since 2019. 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 -She had multiple prolonged hospital stay due to severe GI bleeding.   -s/p IR embolization of gastric artery branch vessels on 12/10/17, cauterization of stomach for severe GI bleed in 11/2018, and epinephrine injection for bleeding ulcer on 11/03/20. -She has required frequent blood transfusion, IV iron and bevacizumab -She does respond to treatment, anemia improved with IV iron and bevacizumab, but she is not very compliant with her appointments.

## 2022-11-15 ENCOUNTER — Inpatient Hospital Stay: Payer: Medicaid Other

## 2022-11-16 ENCOUNTER — Inpatient Hospital Stay (HOSPITAL_COMMUNITY)
Admission: EM | Admit: 2022-11-16 | Discharge: 2022-11-18 | DRG: 300 | Disposition: A | Discharge status: Home / Self Care | Payer: Medicaid Other | Attending: Internal Medicine | Admitting: Internal Medicine

## 2022-11-16 ENCOUNTER — Encounter (HOSPITAL_COMMUNITY): Payer: Self-pay | Admitting: Internal Medicine

## 2022-11-16 ENCOUNTER — Other Ambulatory Visit: Payer: Self-pay

## 2022-11-16 ENCOUNTER — Emergency Department (HOSPITAL_COMMUNITY): Payer: Medicaid Other

## 2022-11-16 DIAGNOSIS — Z8711 Personal history of peptic ulcer disease: Secondary | ICD-10-CM

## 2022-11-16 DIAGNOSIS — E785 Hyperlipidemia, unspecified: Secondary | ICD-10-CM | POA: Diagnosis present

## 2022-11-16 DIAGNOSIS — Z6839 Body mass index (BMI) 39.0-39.9, adult: Secondary | ICD-10-CM

## 2022-11-16 DIAGNOSIS — K921 Melena: Secondary | ICD-10-CM | POA: Diagnosis not present

## 2022-11-16 DIAGNOSIS — K8020 Calculus of gallbladder without cholecystitis without obstruction: Secondary | ICD-10-CM | POA: Diagnosis not present

## 2022-11-16 DIAGNOSIS — Z79899 Other long term (current) drug therapy: Secondary | ICD-10-CM

## 2022-11-16 DIAGNOSIS — Z5982 Transportation insecurity: Secondary | ICD-10-CM

## 2022-11-16 DIAGNOSIS — Z832 Family history of diseases of the blood and blood-forming organs and certain disorders involving the immune mechanism: Secondary | ICD-10-CM

## 2022-11-16 DIAGNOSIS — I780 Hereditary hemorrhagic telangiectasia: Secondary | ICD-10-CM | POA: Diagnosis not present

## 2022-11-16 DIAGNOSIS — F419 Anxiety disorder, unspecified: Secondary | ICD-10-CM | POA: Diagnosis present

## 2022-11-16 DIAGNOSIS — F32A Depression, unspecified: Secondary | ICD-10-CM | POA: Diagnosis present

## 2022-11-16 DIAGNOSIS — D62 Acute posthemorrhagic anemia: Secondary | ICD-10-CM | POA: Diagnosis present

## 2022-11-16 DIAGNOSIS — I700 Atherosclerosis of aorta: Secondary | ICD-10-CM | POA: Diagnosis present

## 2022-11-16 DIAGNOSIS — K920 Hematemesis: Secondary | ICD-10-CM

## 2022-11-16 DIAGNOSIS — M47812 Spondylosis without myelopathy or radiculopathy, cervical region: Secondary | ICD-10-CM | POA: Diagnosis present

## 2022-11-16 DIAGNOSIS — E876 Hypokalemia: Secondary | ICD-10-CM | POA: Diagnosis present

## 2022-11-16 DIAGNOSIS — I10 Essential (primary) hypertension: Secondary | ICD-10-CM | POA: Diagnosis present

## 2022-11-16 DIAGNOSIS — Z833 Family history of diabetes mellitus: Secondary | ICD-10-CM

## 2022-11-16 DIAGNOSIS — I959 Hypotension, unspecified: Secondary | ICD-10-CM | POA: Diagnosis not present

## 2022-11-16 DIAGNOSIS — R0902 Hypoxemia: Secondary | ICD-10-CM | POA: Diagnosis not present

## 2022-11-16 DIAGNOSIS — K429 Umbilical hernia without obstruction or gangrene: Secondary | ICD-10-CM | POA: Diagnosis not present

## 2022-11-16 DIAGNOSIS — R9431 Abnormal electrocardiogram [ECG] [EKG]: Secondary | ICD-10-CM | POA: Diagnosis present

## 2022-11-16 DIAGNOSIS — D509 Iron deficiency anemia, unspecified: Secondary | ICD-10-CM | POA: Diagnosis present

## 2022-11-16 DIAGNOSIS — K219 Gastro-esophageal reflux disease without esophagitis: Secondary | ICD-10-CM | POA: Diagnosis present

## 2022-11-16 DIAGNOSIS — D649 Anemia, unspecified: Secondary | ICD-10-CM | POA: Diagnosis present

## 2022-11-16 DIAGNOSIS — E119 Type 2 diabetes mellitus without complications: Secondary | ICD-10-CM | POA: Diagnosis present

## 2022-11-16 DIAGNOSIS — Z841 Family history of disorders of kidney and ureter: Secondary | ICD-10-CM

## 2022-11-16 DIAGNOSIS — G8929 Other chronic pain: Secondary | ICD-10-CM | POA: Diagnosis present

## 2022-11-16 DIAGNOSIS — R58 Hemorrhage, not elsewhere classified: Secondary | ICD-10-CM | POA: Diagnosis not present

## 2022-11-16 DIAGNOSIS — Z7984 Long term (current) use of oral hypoglycemic drugs: Secondary | ICD-10-CM

## 2022-11-16 DIAGNOSIS — R001 Bradycardia, unspecified: Secondary | ICD-10-CM | POA: Diagnosis not present

## 2022-11-16 DIAGNOSIS — Z66 Do not resuscitate: Secondary | ICD-10-CM | POA: Diagnosis present

## 2022-11-16 DIAGNOSIS — Z8249 Family history of ischemic heart disease and other diseases of the circulatory system: Secondary | ICD-10-CM

## 2022-11-16 DIAGNOSIS — E1159 Type 2 diabetes mellitus with other circulatory complications: Secondary | ICD-10-CM | POA: Diagnosis present

## 2022-11-16 DIAGNOSIS — Z5941 Food insecurity: Secondary | ICD-10-CM

## 2022-11-16 DIAGNOSIS — F17210 Nicotine dependence, cigarettes, uncomplicated: Secondary | ICD-10-CM | POA: Diagnosis present

## 2022-11-16 DIAGNOSIS — R140 Abdominal distension (gaseous): Secondary | ICD-10-CM | POA: Diagnosis not present

## 2022-11-16 DIAGNOSIS — K760 Fatty (change of) liver, not elsewhere classified: Secondary | ICD-10-CM | POA: Diagnosis not present

## 2022-11-16 DIAGNOSIS — Z560 Unemployment, unspecified: Secondary | ICD-10-CM

## 2022-11-16 LAB — CBC WITH DIFFERENTIAL/PLATELET
Abs Immature Granulocytes: 0.04 10*3/uL (ref 0.00–0.07)
Basophils Absolute: 0.1 10*3/uL (ref 0.0–0.1)
Basophils Relative: 1 %
Eosinophils Absolute: 0.1 10*3/uL (ref 0.0–0.5)
Eosinophils Relative: 1 %
HCT: 23.7 % — ABNORMAL LOW (ref 36.0–46.0)
Hemoglobin: 6.6 g/dL — CL (ref 12.0–15.0)
Immature Granulocytes: 0 %
Lymphocytes Relative: 15 %
Lymphs Abs: 1.5 10*3/uL (ref 0.7–4.0)
MCH: 23.1 pg — ABNORMAL LOW (ref 26.0–34.0)
MCHC: 27.8 g/dL — ABNORMAL LOW (ref 30.0–36.0)
MCV: 82.9 fL (ref 80.0–100.0)
Monocytes Absolute: 0.5 10*3/uL (ref 0.1–1.0)
Monocytes Relative: 5 %
Neutro Abs: 7.6 10*3/uL (ref 1.7–7.7)
Neutrophils Relative %: 78 %
Platelets: 274 10*3/uL (ref 150–400)
RBC: 2.86 MIL/uL — ABNORMAL LOW (ref 3.87–5.11)
RDW: 23.4 % — ABNORMAL HIGH (ref 11.5–15.5)
WBC: 9.9 10*3/uL (ref 4.0–10.5)
nRBC: 0 % (ref 0.0–0.2)

## 2022-11-16 LAB — COMPREHENSIVE METABOLIC PANEL
ALT: 8 U/L (ref 0–44)
AST: 12 U/L — ABNORMAL LOW (ref 15–41)
Albumin: 2.7 g/dL — ABNORMAL LOW (ref 3.5–5.0)
Alkaline Phosphatase: 64 U/L (ref 38–126)
Anion gap: 12 (ref 5–15)
BUN: 8 mg/dL (ref 8–23)
CO2: 20 mmol/L — ABNORMAL LOW (ref 22–32)
Calcium: 8.5 mg/dL — ABNORMAL LOW (ref 8.9–10.3)
Chloride: 108 mmol/L (ref 98–111)
Creatinine, Ser: 1.07 mg/dL — ABNORMAL HIGH (ref 0.44–1.00)
GFR, Estimated: 59 mL/min — ABNORMAL LOW (ref >60)
Glucose, Bld: 104 mg/dL — ABNORMAL HIGH (ref 70–99)
Potassium: 2.8 mmol/L — ABNORMAL LOW (ref 3.5–5.1)
Sodium: 140 mmol/L (ref 135–145)
Total Bilirubin: 0.4 mg/dL (ref 0.3–1.2)
Total Protein: 6 g/dL — ABNORMAL LOW (ref 6.5–8.1)

## 2022-11-16 LAB — GLUCOSE, CAPILLARY
Glucose-Capillary: 108 mg/dL — ABNORMAL HIGH (ref 70–99)
Glucose-Capillary: 83 mg/dL (ref 70–99)
Glucose-Capillary: 85 mg/dL (ref 70–99)
Glucose-Capillary: 72 mg/dL (ref 70–99)

## 2022-11-16 LAB — PREPARE RBC (CROSSMATCH)

## 2022-11-16 LAB — PROTIME-INR
INR: 1.2 (ref 0.8–1.2)
Prothrombin Time: 15.3 seconds — ABNORMAL HIGH (ref 11.4–15.2)

## 2022-11-16 LAB — HEMOGLOBIN AND HEMATOCRIT, BLOOD
HCT: 24.6 % — ABNORMAL LOW (ref 36.0–46.0)
HCT: 24.9 % — ABNORMAL LOW (ref 36.0–46.0)
Hemoglobin: 7.5 g/dL — ABNORMAL LOW (ref 12.0–15.0)
Hemoglobin: 7.5 g/dL — ABNORMAL LOW (ref 12.0–15.0)

## 2022-11-16 MED ORDER — TRAZODONE HCL 50 MG PO TABS
100.0000 mg | ORAL_TABLET | Freq: Every day | ORAL | Status: DC
  Administered 2022-11-16 – 2022-11-17 (×2): 100 mg via ORAL
  Filled 2022-11-16 (×2): qty 2

## 2022-11-16 MED ORDER — CITALOPRAM HYDROBROMIDE 20 MG PO TABS
20.0000 mg | ORAL_TABLET | Freq: Every day | ORAL | Status: DC
  Administered 2022-11-16 – 2022-11-18 (×3): 20 mg via ORAL
  Filled 2022-11-16 (×3): qty 1

## 2022-11-16 MED ORDER — ACETAMINOPHEN 500 MG PO TABS
1000.0000 mg | ORAL_TABLET | Freq: Three times a day (TID) | ORAL | Status: DC | PRN
  Administered 2022-11-17 (×2): 1000 mg via ORAL
  Filled 2022-11-16 (×3): qty 2

## 2022-11-16 MED ORDER — PANTOPRAZOLE SODIUM 40 MG IV SOLR
40.0000 mg | Freq: Two times a day (BID) | INTRAVENOUS | Status: DC
  Administered 2022-11-16 – 2022-11-18 (×5): 40 mg via INTRAVENOUS
  Filled 2022-11-16 (×5): qty 10

## 2022-11-16 MED ORDER — SODIUM CHLORIDE 0.9% IV SOLUTION: Freq: Once | INTRAVENOUS | Status: AC

## 2022-11-16 MED ORDER — LISINOPRIL 20 MG PO TABS
20.0000 mg | ORAL_TABLET | Freq: Every day | ORAL | Status: DC
  Administered 2022-11-16 – 2022-11-18 (×3): 20 mg via ORAL
  Filled 2022-11-16 (×3): qty 1

## 2022-11-16 MED ORDER — POTASSIUM CHLORIDE CRYS ER 20 MEQ PO TBCR
40.0000 meq | EXTENDED_RELEASE_TABLET | Freq: Once | ORAL | Status: AC
  Administered 2022-11-16: 40 meq via ORAL
  Filled 2022-11-16: qty 2

## 2022-11-16 MED ORDER — INSULIN ASPART 100 UNIT/ML IJ SOLN
0.0000 [IU] | Freq: Three times a day (TID) | INTRAMUSCULAR | Status: DC
  Administered 2022-11-17: 1 [IU] via SUBCUTANEOUS
  Administered 2022-11-18: 2 [IU] via SUBCUTANEOUS

## 2022-11-16 MED ORDER — ONDANSETRON HCL 4 MG PO TABS: 4.0000 mg | ORAL_TABLET | Freq: Three times a day (TID) | ORAL | Status: DC | PRN

## 2022-11-16 MED ORDER — METFORMIN HCL 500 MG PO TABS: 500.0000 mg | ORAL_TABLET | Freq: Two times a day (BID) | ORAL | Status: DC

## 2022-11-16 MED ORDER — AMINOCAPROIC ACID 500 MG PO TABS
1000.0000 mg | ORAL_TABLET | Freq: Two times a day (BID) | ORAL | Status: DC
  Administered 2022-11-16 – 2022-11-18 (×5): 1000 mg via ORAL
  Filled 2022-11-16 (×7): qty 2

## 2022-11-16 MED ORDER — AMLODIPINE BESYLATE 10 MG PO TABS
10.0000 mg | ORAL_TABLET | Freq: Every day | ORAL | Status: DC
  Administered 2022-11-16 – 2022-11-18 (×3): 10 mg via ORAL
  Filled 2022-11-16 (×3): qty 1

## 2022-11-16 MED ORDER — IOHEXOL 350 MG/ML SOLN
75.0000 mL | Freq: Once | INTRAVENOUS | Status: AC | PRN
  Administered 2022-11-16: 75 mL via INTRAVENOUS

## 2022-11-16 NOTE — Progress Notes (Addendum)
Pt arrived on floor from ER. Bed side report given by ER RN

## 2022-11-16 NOTE — Progress Notes (Addendum)
HD#0 SUBJECTIVE:  Patient Summary: Peggy House is a 62 y.o. with a pertinent PMH of hereditary hemorrhagic telangiectasis, DM2, HTN, HLD, who presented with hematemesis and was admitted for anemia and possible GI bleed.   Overnight Events:  No acute events overnight   Interm History:  Patient was sleeping in bed upon entering room. She reports that she is feeling better. She has had no bowel movements since yesterday. She denies any vomiting, nosebleeds, or other blood loss. She has not been out of bed but denies lightheadedness or dizziness currently. Shared plan of care with patient and she is agreeable.   OBJECTIVE:  Vital Signs: Vitals:   11/16/22 0545 11/16/22 0615 11/16/22 0645 11/16/22 0810  BP: (!) 155/73 (!) 140/66 (!) 179/86 (!) 150/73  Pulse: 62 (!) 59 62 64  Resp: 17 16 16 17   Temp:   98.1 F (36.7 C) 97.9 F (36.6 C)  TempSrc:   Oral Oral  SpO2: 95% 97% 100% 98%   Supplemental O2: Room Air SpO2: 98 %  There were no vitals filed for this visit.   Intake/Output Summary (Last 24 hours) at 11/16/2022 1008 Last data filed at 11/16/2022 0505 Gross per 24 hour  Intake 315 ml  Output --  Net 315 ml   Net IO Since Admission: 315 mL [11/16/22 1008]  Physical Exam:  Gen: alert, well appearing, in no acute distress HEENT: normocephalic, atraumatic CV: RRR, 2/6 systolic murmur at L sternal border Pulm: normal WOB Ab: normoactive bowel sounds, no tenderness or masses Ext: no lower extremity edema  Patient Lines/Drains/Airways Status     Active Line/Drains/Airways     Name Placement date Placement time Site Days   Implanted Port 07/08/18 Right Chest 07/08/18  --  Chest  1592   Peripheral IV 11/16/22 18 G Left Wrist 11/16/22  --  Wrist  less than 1   Peripheral IV 11/16/22 18 G Right Antecubital 11/16/22  0238  Antecubital  less than 1            Pertinent Labs:    Latest Ref Rng & Units 11/16/2022    2:28 AM 10/31/2022   12:57 PM 10/18/2022     1:55 PM  CBC  WBC 4.0 - 10.5 K/uL 9.9  9.5  9.4   Hemoglobin 12.0 - 15.0 g/dL 6.6  5.9  6.9   Hematocrit 36.0 - 46.0 % 23.7  20.2  22.3   Platelets 150 - 400 K/uL 274  411  209        Latest Ref Rng & Units 11/16/2022    2:28 AM 10/31/2022   12:57 PM 10/18/2022    1:55 PM  CMP  Glucose 70 - 99 mg/dL 737  106  94   BUN 8 - 23 mg/dL 8  10  8    Creatinine 0.44 - 1.00 mg/dL 2.69  4.85  4.62   Sodium 135 - 145 mmol/L 140  140  140   Potassium 3.5 - 5.1 mmol/L 2.8  3.6  3.6   Chloride 98 - 111 mmol/L 108  108  108   CO2 22 - 32 mmol/L 20  24  24    Calcium 8.9 - 10.3 mg/dL 8.5  9.0  9.2   Total Protein 6.5 - 8.1 g/dL 6.0  7.0  6.3   Total Bilirubin 0.3 - 1.2 mg/dL 0.4  0.3  0.2   Alkaline Phos 38 - 126 U/L 64  70  72   AST 15 - 41  U/L 12  8  11    ALT 0 - 44 U/L 8  <5  6     Recent Labs    11/16/22 0813  GLUCAP 108*     Pertinent Imaging: CT ANGIO GI BLEED  Result Date: 11/16/2022 CLINICAL DATA:  Hematemesis, multiple episodes. Mild pain across abdomen. EXAM: CTA ABDOMEN AND PELVIS WITHOUT AND WITH CONTRAST TECHNIQUE: Multidetector CT imaging of the abdomen and pelvis was performed using the standard protocol during bolus administration of intravenous contrast. Multiplanar reconstructed images and MIPs were obtained and reviewed to evaluate the vascular anatomy. RADIATION DOSE REDUCTION: This exam was performed according to the departmental dose-optimization program which includes automated exposure control, adjustment of the mA and/or kV according to patient size and/or use of iterative reconstruction technique. CONTRAST:  75mL OMNIPAQUE IOHEXOL 350 MG/ML SOLN COMPARISON:  10/03/2022. FINDINGS: VASCULAR Aorta: Normal caliber aorta without aneurysm, dissection, vasculitis or significant stenosis. Aortic atherosclerosis. Celiac: Patent without evidence of aneurysm, dissection, vasculitis or significant stenosis. SMA: Patent without evidence of aneurysm, dissection, vasculitis or significant  stenosis. Renals: Both renal arteries are patent without evidence of aneurysm, dissection, vasculitis, fibromuscular dysplasia or significant stenosis. IMA: Patent without evidence of aneurysm, dissection, vasculitis or significant stenosis. Inflow: Patent without evidence of aneurysm, dissection, vasculitis or significant stenosis. Proximal Outflow: Bilateral common femoral and visualized portions of the superficial and profunda femoral arteries are patent without evidence of aneurysm, dissection, vasculitis or significant stenosis. Veins: No obvious venous abnormality within the limitations of this arterial phase study. Review of the MIP images confirms the above findings. NON-VASCULAR Lower chest: Mild atelectasis or scarring is noted at the lung bases. Hepatobiliary: No focal liver abnormality is seen. Fatty infiltration of the liver is noted. A stone is present in the gallbladder. Pancreas: Unremarkable. No pancreatic ductal dilatation or surrounding inflammatory changes. Spleen: Normal in size without focal abnormality. Adrenals/Urinary Tract: The adrenal glands are within normal limits. The kidneys enhance symmetrically. There is a cyst in the right kidney. No renal calculus or hydronephrosis. Bladder is unremarkable. Stomach/Bowel: Surgical clips are noted in and surrounding the stomach and the stomach is mildly distended. No contrast extravasation is seen to suggest acute hemorrhage. No free air or pneumatosis. Appendix appears normal. No evidence of bowel wall thickening, distention, or inflammatory changes. Lymphatic: No abdominal or pelvic lymphadenopathy. Reproductive: Status post hysterectomy. No adnexal masses. Other: No abdominopelvic ascites. A small fat containing umbilical hernia is present. Musculoskeletal: Degenerative changes are present in the thoracolumbar spine. No acute osseous abnormality. IMPRESSION: VASCULAR 1. No evidence of active contrast extravasation to suggest active hemorrhage. 2.  Aortic atherosclerosis. NON-VASCULAR 1. Mildly distended stomach with debris. 2. Hepatic steatosis. 3. Cholelithiasis. Electronically Signed   By: Thornell Sartorius M.D.   On: 11/16/2022 03:46    ASSESSMENT/PLAN:  Assessment: Principal Problem:   Symptomatic anemia   Peggy House is a 62 y.o. with pertinent PMH of HHT, DM2, HTN, HLD who presented with hematemesis and admit for anemia and possible GI bleed on hospital day 0  Plan: #HHT #GI Bleed with hematemsis Patient is hemodynamically stable and denies any current symptoms of GI bleeding. She has had several similar admissions in the past requiring blood transfusion and EGDs/GI procedures. Consulted GI Dr. Dulce Sellar and he will come evaluate patient to determine whether EGD or other procedures are necessary. Will continue to treat with amicar and protonix. 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 Received 1 unit of PRBCs. Rechecking H&H and can transfuse as needed. -CBC post transfusion -Transfuse with HGB goal of 8 -Patient received 2 units of blood, awaiting repeat H/H now  #DM2 -SSI -Metformin  #HTN Patient was hypotensive at admission but became hypertensive with fluid replacement. Last BP of 150/73. Continue amlodipine and lisinopril. -Amlodipine 10 mg -Lisinopril 20 mg  #Mild increase in Cr - related to GI bleeding and medications - Monitor - Avoid NSAIDs, trend  #Depression and Anxiety -Citalopram 20 mg daily -Trazodone 100 mg at night  Best Practice: Diet: NPO until GI evaluates IVF: Fluids: none, Rate: None VTE: SCDs Start: 11/16/22 0500 Code: DNR AB: none Therapy Recs: Pending, DME: none Family Contact: Huntley Estelle, to be notified. DISPO: Anticipated discharge tomorrow to Home pending  GI evaluation, stable anemia .  Signature: Randell Patient, Medical  Student   Please contact the on call pager after 5 pm and on weekends at [phone].

## 2022-11-16 NOTE — Plan of Care (Signed)
Problem: Education: Goal: Ability to describe self-care measures that may prevent or decrease complications (Diabetes Survival Skills Education) will improve 11/16/2022 1640 by Milana Kidney, RN Outcome: Progressing 11/16/2022 1023 by Milana Kidney, RN Outcome: Progressing Goal: Individualized Educational Video(s) 11/16/2022 1640 by Milana Kidney, RN Outcome: Progressing 11/16/2022 1023 by Milana Kidney, RN Outcome: Progressing   Problem: Coping: Goal: Ability to adjust to condition or change in health will improve 11/16/2022 1640 by Murriel Hopper,  L, RN Outcome: Progressing 11/16/2022 1023 by Milana Kidney, RN Outcome: Progressing   Problem: Fluid Volume: Goal: Ability to maintain a balanced intake and output will improve 11/16/2022 1640 by Milana Kidney, RN Outcome: Progressing 11/16/2022 1023 by Milana Kidney, RN Outcome: Progressing   Problem: Health Behavior/Discharge Planning: Goal: Ability to identify and utilize available resources and services will improve 11/16/2022 1640 by Milana Kidney, RN Outcome: Progressing 11/16/2022 1023 by Milana Kidney, RN Outcome: Progressing Goal: Ability to manage health-related needs will improve 11/16/2022 1640 by Murriel Hopper,  L, RN Outcome: Progressing 11/16/2022 1023 by Milana Kidney, RN Outcome: Progressing   Problem: Metabolic: Goal: Ability to maintain appropriate glucose levels will improve 11/16/2022 1640 by Murriel Hopper,  L, RN Outcome: Progressing 11/16/2022 1023 by Milana Kidney, RN Outcome: Progressing   Problem: Nutritional: Goal: Maintenance of adequate nutrition will improve 11/16/2022 1640 by Milana Kidney, RN Outcome: Progressing 11/16/2022 1023 by Milana Kidney, RN Outcome: Progressing Goal: Progress toward achieving an optimal weight will improve 11/16/2022 1640 by Milana Kidney, RN Outcome: Progressing 11/16/2022 1023 by Milana Kidney, RN Outcome: Progressing   Problem: Skin Integrity: Goal: Risk for impaired skin integrity will  decrease 11/16/2022 1640 by Milana Kidney, RN Outcome: Progressing 11/16/2022 1023 by Milana Kidney, RN Outcome: Progressing   Problem: Tissue Perfusion: Goal: Adequacy of tissue perfusion will improve 11/16/2022 1640 by Milana Kidney, RN Outcome: Progressing 11/16/2022 1023 by Milana Kidney, RN Outcome: Progressing   Problem: Education: Goal: Knowledge of General Education information will improve Description: Including pain rating scale, medication(s)/side effects and non-pharmacologic comfort measures 11/16/2022 1640 by Milana Kidney, RN Outcome: Progressing 11/16/2022 1023 by Milana Kidney, RN Outcome: Progressing   Problem: Health Behavior/Discharge Planning: Goal: Ability to manage health-related needs will improve 11/16/2022 1640 by Milana Kidney, RN Outcome: Progressing 11/16/2022 1023 by Milana Kidney, RN Outcome: Progressing   Problem: Clinical Measurements: Goal: Ability to maintain clinical measurements within normal limits will improve 11/16/2022 1640 by Murriel Hopper,  L, RN Outcome: Progressing 11/16/2022 1023 by Milana Kidney, RN Outcome: Progressing Goal: Will remain free from infection 11/16/2022 1640 by Milana Kidney, RN Outcome: Progressing 11/16/2022 1023 by Milana Kidney, RN Outcome: Progressing Goal: Diagnostic test results will improve 11/16/2022 1640 by Milana Kidney, RN Outcome: Progressing 11/16/2022 1023 by Milana Kidney, RN Outcome: Progressing Goal: Respiratory complications will improve 11/16/2022 1640 by Milana Kidney, RN Outcome: Progressing 11/16/2022 1023 by Milana Kidney, RN Outcome: Progressing Goal: Cardiovascular complication will be avoided 11/16/2022 1640 by Milana Kidney, RN Outcome: Progressing 11/16/2022 1023 by Milana Kidney, RN Outcome: Progressing   Problem: Activity: Goal: Risk for activity intolerance will decrease 11/16/2022 1640 by Murriel Hopper,  L, RN Outcome: Progressing 11/16/2022 1023 by Milana Kidney, RN Outcome: Progressing   Problem:  Nutrition: Goal: Adequate nutrition will be maintained 11/16/2022 1640 by Milana Kidney, RN Outcome: Progressing 11/16/2022 1023 by Milana Kidney, RN Outcome: Progressing  Problem: Coping: Goal: Level of anxiety will decrease 11/16/2022 1640 by Murriel Hopper,  L, RN Outcome: Progressing 11/16/2022 1023 by Murriel Hopper,  L, RN Outcome: Progressing   Problem: Elimination: Goal: Will not experience complications related to bowel motility 11/16/2022 1640 by Milana Kidney, RN Outcome: Progressing 11/16/2022 1023 by Milana Kidney, RN Outcome: Progressing Goal: Will not experience complications related to urinary retention 11/16/2022 1640 by Milana Kidney, RN Outcome: Progressing 11/16/2022 1023 by Milana Kidney, RN Outcome: Progressing   Problem: Pain Managment: Goal: General experience of comfort will improve 11/16/2022 1640 by Milana Kidney, RN Outcome: Progressing 11/16/2022 1023 by Milana Kidney, RN Outcome: Progressing   Problem: Safety: Goal: Ability to remain free from injury will improve 11/16/2022 1640 by ,  L, RN Outcome: Progressing 11/16/2022 1023 by Milana Kidney, RN Outcome: Progressing   Problem: Skin Integrity: Goal: Risk for impaired skin integrity will decrease 11/16/2022 1640 by Milana Kidney, RN Outcome: Progressing 11/16/2022 1023 by Milana Kidney, RN Outcome: Progressing

## 2022-11-16 NOTE — H&P (Signed)
Date: 11/16/2022               Patient Name:  Peggy House MRN: [id number]  DOB: 04/17/1960 Age / Sex: 62 y.o., female   PCP: Olegario Messier, MD         Medical Service: Internal Medicine Teaching Service         Attending Physician: Dr. Inez Catalina, MD      First Contact: Dr. Laretta Bolster, MD Pager [phone]    Second Contact: Dr. 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 She states the event occurred this past night, Friday evening around 8pm. She had a single episode and afterwards felt lightheaded with lower abdominal pain. She has not had abdominal pain before today. That same day, she reported feeling nauseous. She was unable to eat or drink on Friday due to nausea. She believes the cause was ibuprofen she took on the morning of - two pills for knee pain. She does not report taking recent ibuprofen before this day. She has not eaten any new food, traveled, or had sick contacts. She has not felt feverish, experienced chest pain, or noticed recent melena - although she has experienced intermittent diarrhea for the past two weeks that resolved yesterday and also had three days of black stools last week, but that has resolved. She has felt chills for one week and reports orthopnea and dyspnea on exertion.  She has experienced similar episodes like this before due to her HHT. Her most recent episode of hematemesis occurred about two weeks ago and she received blood at a transfusion center. She has frequent nosebleeds as well and her most recent nosebleed was earlier this week.  ED  Course: During initial evalation by EMS at home she was hypotensive 75/40 and received ns IV and Zofran. Upon arrival BP had improved to 122/62. In the ED she received 1u PRBC. CT angio GI bleed was negative. CBC revealed Hg 6.6. CMP revealed K 2.8, Bicarb 20, AG 12. At intake, nausea and abdominal pain had resolved and IMTS took over care.  Past Medical History Hereditary Hemorrhagic Telangiectasia Blood loss Anemia  MDD and GAD T2 Diabetes HTN HLD HERD Arthritis Obesity Tobacco Dependence   Meds:  Xanax 0.5mg  at bedtime Amlodipine 10 every day Citalopram 20 every day Lisinopril 20mg  every day Metformin 500 BID Nicotine 14mg  patch Ondansetron 4mg  q8h prn Pantoprazole 40mg  BID Potassium chloride every day Proair HFA 108 inhaler 1-2 puff q6h prn Trazodone 100mg  at bedtime Tylenol pm 500-25 x2 at bedtime Amicar 1000mg  BID (pt not taking x 3 months)   Past Surgical History Past Surgical History:  Procedure Laterality Date   ABDOMINAL HYSTERECTOMY     CARPAL TUNNEL RELEASE  05/13/2011   Procedure: CARPAL TUNNEL RELEASE;  Surgeon: Mable Paris, MD;  Location: [facility];  Service: Orthopedics;  Laterality: Left;   COLONOSCOPY N/A 03/02/2020   Procedure: COLONOSCOPY;  Surgeon: Bernette Redbird, MD;  Location: [facility] ENDOSCOPY;  Service: Endoscopy;  Laterality: N/A;   COLONOSCOPY WITH PROPOFOL N/A 04/28/2014   Procedure: COLONOSCOPY WITH PROPOFOL;  Surgeon:  Florencia Reasons, MD;  Location: [facility] ENDOSCOPY;  Service: Endoscopy;  Laterality: N/A;   DG TOES*L*  2/10   rt   DILATION AND CURETTAGE OF UTERUS     ENTEROSCOPY N/A 10/17/2017   Procedure: ENTEROSCOPY;  Surgeon: Kathi Der, MD;  Location: [facility] ENDOSCOPY;  Service: Gastroenterology;  Laterality: N/A;   ESOPHAGOGASTRODUODENOSCOPY N/A 04/10/2014   Procedure: ESOPHAGOGASTRODUODENOSCOPY (EGD);  Surgeon: Shirley Friar, MD;  Location: [facility] ENDOSCOPY;  Service: Endoscopy;  Laterality: N/A;    ESOPHAGOGASTRODUODENOSCOPY N/A 05/10/2017   Procedure: ESOPHAGOGASTRODUODENOSCOPY (EGD);  Surgeon: Bernette Redbird, MD;  Location: [facility] ENDOSCOPY;  Service: Endoscopy;  Laterality: N/A;   ESOPHAGOGASTRODUODENOSCOPY N/A 09/22/2017   Procedure: ESOPHAGOGASTRODUODENOSCOPY (EGD);  Surgeon: Vida Rigger, MD;  Location: [facility] ENDOSCOPY;  Service: Endoscopy;  Laterality: N/A;  bedside   ESOPHAGOGASTRODUODENOSCOPY N/A 03/02/2020   Procedure: ESOPHAGOGASTRODUODENOSCOPY (EGD);  Surgeon: Bernette Redbird, MD;  Location: [facility]OSCOPY;  Service: Endoscopy;  Laterality: N/A;   ESOPHAGOGASTRODUODENOSCOPY N/A 11/03/2020   Procedure: ESOPHAGOGASTRODUODENOSCOPY (EGD);  Surgeon: Charlott Rakes, MD;  Location: [facility] ENDOSCOPY;  Service: Endoscopy;  Laterality: N/A;   ESOPHAGOGASTRODUODENOSCOPY N/A 04/12/2022   Procedure: ESOPHAGOGASTRODUODENOSCOPY (EGD);  Surgeon: Vida Rigger, MD;  Location: [facility] ENDOSCOPY;  Service: Gastroenterology;  Laterality: N/A;   ESOPHAGOGASTRODUODENOSCOPY N/A 05/27/2022   Procedure: ESOPHAGOGASTRODUODENOSCOPY (EGD);  Surgeon: Willis Modena, MD;  Location: [facility] ENDOSCOPY;  Service: Gastroenterology;  Laterality: N/A;   ESOPHAGOGASTRODUODENOSCOPY N/A 09/27/2022   Procedure: ESOPHAGOGASTRODUODENOSCOPY (EGD);  Surgeon: Lynann Bologna, DO;  Location: [facility] ENDOSCOPY;  Service: Gastroenterology;  Laterality: N/A;   ESOPHAGOGASTRODUODENOSCOPY (EGD) WITH PROPOFOL N/A 04/27/2014   Procedure: ESOPHAGOGASTRODUODENOSCOPY (EGD) WITH PROPOFOL;  Surgeon: Florencia Reasons, MD;  Location: [facility] ENDOSCOPY;  Service: Endoscopy;  Laterality: N/A;  possible apc   ESOPHAGOGASTRODUODENOSCOPY (EGD) WITH PROPOFOL N/A 09/30/2017   Procedure: ESOPHAGOGASTRODUODENOSCOPY (EGD) WITH PROPOFOL;  Surgeon: Kerin Salen, MD;  Location: [facility] ENDOSCOPY;  Service: Gastroenterology;  Laterality: N/A;   ESOPHAGOGASTRODUODENOSCOPY (EGD) WITH PROPOFOL N/A 10/01/2017   Procedure: ESOPHAGOGASTRODUODENOSCOPY (EGD) WITH PROPOFOL;  Surgeon: Kerin Salen, MD;   Location: [facility] ENDOSCOPY;  Service: Gastroenterology;  Laterality: N/A;   ESOPHAGOGASTRODUODENOSCOPY (EGD) WITH PROPOFOL N/A 10/08/2017   Procedure: ESOPHAGOGASTRODUODENOSCOPY (EGD) WITH PROPOFOL;  Surgeon: Kathi Der, MD;  Location: [facility] ENDOSCOPY;  Service: Gastroenterology;  Laterality: N/A;   ESOPHAGOGASTRODUODENOSCOPY (EGD) WITH PROPOFOL N/A 10/17/2017   Procedure: ESOPHAGOGASTRODUODENOSCOPY (EGD) WITH PROPOFOL;  Surgeon: Kathi Der, MD;  Location: [facility] ENDOSCOPY;  Service: Gastroenterology;  Laterality: N/A;   ESOPHAGOGASTRODUODENOSCOPY (EGD) WITH PROPOFOL N/A 10/19/2017   Procedure: ESOPHAGOGASTRODUODENOSCOPY (EGD) WITH PROPOFOL;  Surgeon: Kathi Der, MD;  Location: [facility] ENDOSCOPY;  Service: Gastroenterology;  Laterality: N/A;   ESOPHAGOGASTRODUODENOSCOPY (EGD) WITH PROPOFOL N/A 12/04/2018   Procedure: ESOPHAGOGASTRODUODENOSCOPY (EGD) WITH PROPOFOL;  Surgeon: Charlott Rakes, MD;  Location: [facility] ENDOSCOPY;  Service: Endoscopy;  Laterality: N/A;   GIVENS CAPSULE STUDY N/A 10/02/2017   Procedure: GIVENS CAPSULE STUDY;  Surgeon: Kerin Salen, MD;  Location: [facility] ENDOSCOPY;  Service: Gastroenterology;  Laterality: N/A;   GIVENS CAPSULE STUDY N/A 10/08/2017   Procedure: GIVENS CAPSULE STUDY;  Surgeon: Kathi Der, MD;  Location: [facility] ENDOSCOPY;  Service: Gastroenterology;  Laterality: N/A;  endoscopic placement of capsule   GIVENS CAPSULE STUDY N/A 03/02/2020   Procedure: GIVENS CAPSULE STUDY;  Surgeon: Bernette Redbird, MD;  Location: [facility] ENDOSCOPY;  Service: Endoscopy;  Laterality: N/A;   HEMOSTASIS CLIP PLACEMENT  12/04/2018   Procedure: HEMOSTASIS CLIP PLACEMENT;  Surgeon: Charlott Rakes, MD;  Location: [facility] ENDOSCOPY;  Service: Endoscopy;;   HEMOSTASIS CLIP PLACEMENT  05/27/2022   Procedure:  HEMOSTASIS CLIP PLACEMENT;  Surgeon: Willis Modena, MD;  Location: [facility] ENDOSCOPY;  Service: Gastroenterology;;   HEMOSTASIS CLIP PLACEMENT  09/27/2022   Procedure: HEMOSTASIS CLIP PLACEMENT;   Surgeon: Lynann Bologna, DO;  Location: [facility] ENDOSCOPY;  Service: Gastroenterology;;   HEMOSTASIS CONTROL  05/27/2022   Procedure: HEMOSTASIS CONTROL;  Surgeon: Willis Modena, MD;  Location: [facility] ENDOSCOPY;  Service: Gastroenterology;;   HOT HEMOSTASIS N/A 04/27/2014   Procedure: HOT HEMOSTASIS (ARGON PLASMA COAGULATION/BICAP);  Surgeon: Florencia Reasons, MD;  Location: [facility] ENDOSCOPY;  Service: Endoscopy;  Laterality: N/A;   HOT HEMOSTASIS N/A 09/30/2017   Procedure: HOT HEMOSTASIS (ARGON PLASMA COAGULATION/BICAP);  Surgeon: Kerin Salen, MD;  Location: [facility] ENDOSCOPY;  Service: Gastroenterology;  Laterality: N/A;   HOT HEMOSTASIS N/A 10/01/2017   Procedure: HOT HEMOSTASIS (ARGON PLASMA COAGULATION/BICAP);  Surgeon: Kerin Salen, MD;  Location: [facility] ENDOSCOPY;  Service: Gastroenterology;  Laterality: N/A;   HOT HEMOSTASIS N/A 10/17/2017   Procedure: HOT HEMOSTASIS (ARGON PLASMA COAGULATION/BICAP);  Surgeon: Kathi Der, MD;  Location: [facility] ENDOSCOPY;  Service: Gastroenterology;  Laterality: N/A;   HOT HEMOSTASIS N/A 10/19/2017   Procedure: HOT HEMOSTASIS (ARGON PLASMA COAGULATION/BICAP);  Surgeon: Kathi Der, MD;  Location: [facility] ENDOSCOPY;  Service: Gastroenterology;  Laterality: N/A;   HOT HEMOSTASIS N/A 03/02/2020   Procedure: HOT HEMOSTASIS (ARGON PLASMA COAGULATION/BICAP);  Surgeon: Bernette Redbird, MD;  Location: [facility] END[facility]COPY;  Service: Endoscopy;  Laterality: N/A;   HOT HEMOSTASIS N/A 04/12/2022   Procedure: HOT HEMOSTASIS (ARGON PLASMA COAGULATION/BICAP);  Surgeon: Vida Rigger, MD;  Location: [facility] ENDOSCOPY;  Service: Gastroenterology;  Laterality: N/A;   IR IMAGING GUIDED PORT INSERTION  07/08/2018   L shoulder Surgery  2011   POLYPECTOMY  03/02/2020   Procedure: POLYPECTOMY;  Surgeon: Bernette Redbird, MD;  Location: [facility] ENDOSCOPY;  Service: Endoscopy;;   SCLEROTHERAPY  11/03/2020   Procedure: Susa Day;  Surgeon: Charlott Rakes, MD;  Location: [facility] ENDOSCOPY;  Service: Endoscopy;;    SUBMUCOSAL INJECTION  09/22/2017   Procedure: SUBMUCOSAL INJECTION;  Surgeon: Vida Rigger, MD;  Location: [facility] ENDOSCOPY;  Service: Endoscopy;;   SUBMUCOSAL INJECTION  12/04/2018   Procedure: SUBMUCOSAL INJECTION;  Surgeon: Charlott Rakes, MD;  Location: [facility] ENDOSCOPY;  Service: Endoscopy;;    Social:  Lives with son and his girlfriend Occupation: unemployed Support: her son helps her with tasks Level of Function: limited in ADLs/IADLs due to chronic pain and HHT PCP: Nooruddin, Jason Fila, MD Substances: current smoker 1/2ppd x 40 years, no alcohol or substances  Family History:  GI malignancies, HHT  Allergies: Allergies as of 11/16/2022 - Reviewed 11/16/2022  Allergen Reaction Noted   Feraheme [ferumoxytol] Other (See Comments) 10/09/2017   Nsaids Other (See Comments) 04/25/2014   Tomato Hives 01/01/2012   Iron (ferrous sulfate) [ferrous sulfate er] Other (See Comments) 03/07/2020   Wasp venom Swelling 10/06/2017    Review of Systems: A complete ROS was negative except as per HPI.   OBJECTIVE:   Physical Exam: Blood pressure (!) 164/69, pulse 60, temperature (!) 96.8 F (36 C), temperature source Temporal, resp. rate 13, SpO2 100%.  Constitutional: Tired appearing. In no acute distress. Dried blood noted by mouth, chest, palms, soles of feet HENT: Normocephalic, atraumatic,  Eyes: Sclera non-icteric, PERRL, EOM intact Cardio:Borderline bradycardic with occasional PACs. 2+ bilateral radial and dorsalis pedis  pulses. Pulm:Clear to auscultation bilaterally. Normal work of breathing on room air. Abdomen: Soft, non-tender, non-distended, positive bowel sounds. DGU:YQIHK lower extremity edema Skin:Warm and dry. Neuro:Alert and oriented x3. No focal deficit noted. Psych:Pleasant mood and affect.  Labs: CBC    Component Value Date/Time   WBC 9.9 11/16/2022 0228   RBC 2.86 (L) 11/16/2022 0228   HGB 6.6 (LL) 11/16/2022 0228   HGB 5.9 (LL) 10/31/2022 1257   HGB 8.6 (L)  06/14/2020 1626   HGB 8.1 (L) 02/05/2017 1407   HCT 23.7 (L) 11/16/2022 0228   HCT 28.2 (L) 06/14/2020 1626   HCT 26.6 (L) 02/05/2017 1407   PLT 274 11/16/2022 0228   PLT 411 (H) 10/31/2022 1257   PLT 353 06/14/2020 1626   MCV 82.9 11/16/2022 0228   MCV 89 06/14/2020 1626   MCV 82.8 02/05/2017 1407   MCH 23.1 (L) 11/16/2022 0228   MCHC 27.8 (L) 11/16/2022 0228   RDW 23.4 (H) 11/16/2022 0228   RDW 18.9 (H) 06/14/2020 1626   RDW 22.6 (H) 02/05/2017 1407   LYMPHSABS 1.5 11/16/2022 0228   LYMPHSABS 2.5 05/20/2017 1551   LYMPHSABS 2.6 02/05/2017 1407   MONOABS 0.5 11/16/2022 0228   MONOABS 0.5 02/05/2017 1407   EOSABS 0.1 11/16/2022 0228   EOSABS 0.1 05/20/2017 1551   BASOSABS 0.1 11/16/2022 0228   BASOSABS 0.0 05/20/2017 1551   BASOSABS 0.1 02/05/2017 1407     CMP     Component Value Date/Time   NA 140 11/16/2022 0228   NA 147 (H) 04/02/2017 1708   NA 141 02/05/2017 1407   K 2.8 (L) 11/16/2022 0228   K 3.5 02/05/2017 1407   CL 108 11/16/2022 0228   CO2 20 (L) 11/16/2022 0228   CO2 24 02/05/2017 1407   GLUCOSE 104 (H) 11/16/2022 0228   GLUCOSE 134 02/05/2017 1407   BUN 8 11/16/2022 0228   BUN 5 (L) 04/02/2017 1708   BUN 9.3 02/05/2017 1407   CREATININE 1.07 (H) 11/16/2022 0228   CREATININE 0.92 10/31/2022 1257   CREATININE 0.9 02/05/2017 1407   CALCIUM 8.5 (L) 11/16/2022 0228   CALCIUM 8.9 02/05/2017 1407   PROT 6.0 (L) 11/16/2022 0228   PROT 6.7 04/02/2017 1708   PROT 7.1 02/05/2017 1407   ALBUMIN 2.7 (L) 11/16/2022 0228   ALBUMIN 3.8 04/02/2017 1708   ALBUMIN 3.5 02/05/2017 1407   AST 12 (L) 11/16/2022 0228   AST 8 (L) 10/31/2022 1257   AST 13 02/05/2017 1407   ALT 8 11/16/2022 0228   ALT <5 10/31/2022 1257   ALT 8 02/05/2017 1407   ALKPHOS 64 11/16/2022 0228   ALKPHOS 84 02/05/2017 1407   BILITOT 0.4 11/16/2022 0228   BILITOT 0.3 10/31/2022 1257   BILITOT 0.23 02/05/2017 1407   GFRNONAA 59 (L) 11/16/2022 0228   GFRNONAA >60 10/31/2022 1257   GFRAA >60  01/06/2020 0921    Imaging: CT ANGIO GI BLEED  Result Date: 11/16/2022 CLINICAL DATA:  Hematemesis, multiple episodes. Mild pain across abdomen. EXAM: CTA ABDOMEN AND PELVIS WITHOUT AND WITH CONTRAST TECHNIQUE: Multidetector CT imaging of the abdomen and pelvis was performed using the standard protocol during bolus administration of intravenous contrast. Multiplanar reconstructed images and MIPs were obtained and reviewed to evaluate the vascular anatomy. RADIATION DOSE REDUCTION: This exam was performed according to the departmental dose-optimization program which includes automated exposure control, adjustment of the mA and/or kV according to patient size and/or use of iterative reconstruction technique. CONTRAST:  75mL OMNIPAQUE IOHEXOL 350 MG/ML SOLN COMPARISON:  10/03/2022. FINDINGS: VASCULAR Aorta: Normal caliber aorta without aneurysm, dissection, vasculitis or significant stenosis. Aortic atherosclerosis. Celiac: Patent without evidence of aneurysm, dissection, vasculitis or significant stenosis. SMA: Patent without evidence of aneurysm, dissection,  vasculitis or significant stenosis. Renals: Both renal arteries are patent without evidence of aneurysm, dissection, vasculitis, fibromuscular dysplasia or significant stenosis. IMA: Patent without evidence of aneurysm, dissection, vasculitis or significant stenosis. Inflow: Patent without evidence of aneurysm, dissection, vasculitis or significant stenosis. Proximal Outflow: Bilateral common femoral and visualized portions of the superficial and profunda femoral arteries are patent without evidence of aneurysm, dissection, vasculitis or significant stenosis. Veins: No obvious venous abnormality within the limitations of this arterial phase study. Review of the MIP images confirms the above findings. NON-VASCULAR Lower chest: Mild atelectasis or scarring is noted at the lung bases. Hepatobiliary: No focal liver abnormality is seen. Fatty infiltration of the  liver is noted. A stone is present in the gallbladder. Pancreas: Unremarkable. No pancreatic ductal dilatation or surrounding inflammatory changes. Spleen: Normal in size without focal abnormality. Adrenals/Urinary Tract: The adrenal glands are within normal limits. The kidneys enhance symmetrically. There is a cyst in the right kidney. No renal calculus or hydronephrosis. Bladder is unremarkable. Stomach/Bowel: Surgical clips are noted in and surrounding the stomach and the stomach is mildly distended. No contrast extravasation is seen to suggest acute hemorrhage. No free air or pneumatosis. Appendix appears normal. No evidence of bowel wall thickening, distention, or inflammatory changes. Lymphatic: No abdominal or pelvic lymphadenopathy. Reproductive: Status post hysterectomy. No adnexal masses. Other: No abdominopelvic ascites. A small fat containing umbilical hernia is present. Musculoskeletal: Degenerative changes are present in the thoracolumbar spine. No acute osseous abnormality. IMPRESSION: VASCULAR 1. No evidence of active contrast extravasation to suggest active hemorrhage. 2. Aortic atherosclerosis. NON-VASCULAR 1. Mildly distended stomach with debris. 2. Hepatic steatosis. 3. Cholelithiasis. Electronically Signed   By: Thornell Sartorius M.D.   On: 11/16/2022 03:46     EKG: personally reviewed my interpretation is sinus bradycardia with L axis deviation and Qtc prolonged to 507s. Prior EKG without bradycardia or Qtc above 500.  ASSESSMENT & PLAN:   Assessment & Plan by Problem: Principal Problem:   Symptomatic anemia  Peggy House is a 62 y.o. 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  Symptomatic Anemia Hematemasis Hereditary Hemorrhagic Telangiectasia (HHT) Pt with one episode of significant hematemesis of bright red and clotted blood overnight. Her anemia is  symptomatic with frequent lightheadedness, dyspnea on exertion, and fatigue. CT GI bleed was negative. Known history of HHT for which she experiences frequent nosebleeds and intermittent GI bleeding. She frequently requires blood and iron transfusions, the most recent of which was two weeks ago after another occurrence of hematemesis. She has also required multiple APCs. Per her hematologist, her Hg threshold is 8 and today she is 6.6. 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 At the time, H. Pylori stool antigen test as outpatient was recommended. Overall, patient experienced a significant episode of hematemesis. It does not appear she has a significant active GI bleed given her CT and her denial of melena, but it is certainly something to keep at top of mind. Alternatively, can consider nosebleeds draining to stomach. Her anemia is symptomatic with fatigue, lightheadedness, and fatigue daily. She has also been taking ibuprofen for at least a few weeks which is likely contributing. - Trend Hemoglobin - 1u PRBCs underway, anticipate another with Hg goal above 8 per  her hematologist - hold medical VTE prophylaxis - hold home Zofran for now, ECG showed prolonged Qtc - continue Protonix 400mg  BID - continue home Amicar 1000mg  - Consult GI - Consult hematology, she follows with Dr. Mosetta Putt and receives bevacizumab infusions. - AVOID NSAIDs  Hypotension Chronic Hypertension When seen by EMS her BP was 70s/40s. Improved after IVF bolus. Normotensive on our exam but since has been hypertensive with SBP in the 170s. Resumed home amlodipine 10 mg daily and lisinopril 20 mg daily.   Hypokalemia Potassium today 2.8 - Received potassium chloride - continue home Potassium chloride every day  Chronic  Stable Medical Issues  T2DM Most recent A1c was 5 - continue home Metformin 500 BID - SSI  Depression and Anxiety - hold home Xanax 0.5 mg at bedtime PRN due to somnolence likely 2/2 anemia - continue home Citalopram 20 every day - continue home Trazodone 100mg  at bedtime, ebsyre   Tobacco Dependence 1/2 ppd smoker x 40 years - continue home nicotine patch 14mg   Chronic pain Arthritis of neck and knee - continue home tylenol 1000mg  q8h prn - AVOID NSAIDS  Diet: NPO VTE: SCDs IVF: None,None Code: DNR  Dispo: Admit patient to Observation with expected length of stay less than 2 midnights.  Signed: Katheran James, DO Internal Medicine Resident PGY-1  11/16/2022, 5:01 AM   Dr. Laretta Bolster, MD Pager [phone]

## 2022-11-16 NOTE — ED Notes (Addendum)
PA notified on patient's low Hgb.

## 2022-11-16 NOTE — Consult Note (Signed)
[facility] Consultation Note  Referring Provider: [facility] Primary Care Physician:  Olegario Messier, MD Primary Gastroenterologist:  [facility]  Reason for Consultation:  hematemesis, melena  HPI: Peggy House is a 62 y.o. female presenting hematemesis, melena, acute blood loss anemia.  Innumerable prior admissions for the same.  Innumerable prior endoscopies including about 6 weeks ago, at which time no active bleeding seen and ulcer stomach noted.  No further hematemesis or melena since admission.  Was taking NSAIDs regularly leading up to admission.   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    Past Surgical History:  Procedure Laterality Date   ABDOMINAL HYSTERECTOMY     CARPAL TUNNEL RELEASE  05/13/2011   Procedure: CARPAL TUNNEL RELEASE;  Surgeon: Mable Paris, MD;  Location: [facility];  Service: Orthopedics;  Laterality: Left;   COLONOSCOPY N/A 03/02/2020   Procedure: COLONOSCOPY;  Surgeon: Bernette Redbird, MD;  Location: [facility] ENDOSCOPY;  Service: Endoscopy;  Laterality: N/A;   COLONOSCOPY WITH PROPOFOL N/A 04/28/2014   Procedure: COLONOSCOPY WITH PROPOFOL;  Surgeon: Florencia Reasons, MD;  Location: [facility] ENDOSCOPY;  Service: Endoscopy;  Laterality: N/A;   DG TOES*L*  2/10   rt   DILATION AND CURETTAGE OF UTERUS     ENTEROSCOPY N/A 10/17/2017   Procedure: ENTEROSCOPY;  Surgeon: Kathi Der, MD;  Location: [facility] ENDOSCOPY;  Service: Gastroenterology;  Laterality: N/A;   ESOPHAGOGASTRODUODENOSCOPY N/A 04/10/2014   Procedure: ESOPHAGOGASTRODUODENOSCOPY (EGD);  Surgeon: Shirley Friar,  MD;  Location: [facility] ENDOSCOPY;  Service: Endoscopy;  Laterality: N/A;   ESOPHAGOGASTRODUODENOSCOPY N/A 05/10/2017   Procedure: ESOPHAGOGASTRODUODENOSCOPY (EGD);  Surgeon: Bernette Redbird, MD;  Location: [facility] ENDOSCOPY;  Service: Endoscopy;  Laterality: N/A;   ESOPHAGOGASTRODUODENOSCOPY N/A 09/22/2017   Procedure: ESOPHAGOGASTRODUODENOSCOPY (EGD);  Surgeon: Vida Rigger, MD;  Location: [facility] ENDOSCOPY;  Service: Endoscopy;  Laterality: N/A;  bedside   ESOPHAGOGASTRODUODENOSCOPY N/A 03/02/2020   Procedure: ESOPHAGOGASTRODUODENOSCOPY (EGD);  Surgeon: Bernette Redbird, MD;  Location: [facility] ENDOSCOPY;  Service: Endoscopy;  Laterality: N/A;   ESOPHAGOGASTRODUODENOSCOPY N/A 11/03/2020   Procedure: ESOPHAGOGASTRODUODENOSCOPY (EGD);  Surgeon: Charlott Rakes, MD;  Location: [facility] ENDOSCOPY;  Service: Endoscopy;  Laterality: N/A;   ESOPHAGOGASTRODUODENOSCOPY N/A 04/12/2022   Procedure: ESOPHAGOGASTRODUODENOSCOPY (EGD);  Surgeon: Vida Rigger, MD;  Location: [facility] ENDOSCOPY;  Service: Gastroenterology;  Laterality: N/A;   ESOPHAGOGASTRODUODENOSCOPY N/A 05/27/2022   Procedure: ESOPHAGOGASTRODUODENOSCOPY (EGD);  Surgeon: Willis Modena, MD;  Location: [facility] ENDOSCOPY;  Service: Gastroenterology;  Laterality: N/A;   ESOPHAGOGASTRODUODENOSCOPY N/A 09/27/2022   Procedure: ESOPHAGOGASTRODUODENOSCOPY (EGD);  Surgeon: Lynann Bologna, DO;  Location: [facility] ENDOSCOPY;  Service: Gastroenterology;  Laterality: N/A;   ESOPHAGOGASTRODUODENOSCOPY (EGD) WITH PROPOFOL N/A 04/27/2014   Procedure: ESOPHAGOGASTRODUODENOSCOPY (EGD) WITH PROPOFOL;  Surgeon: Florencia Reasons, MD;  Location: [facility] ENDOSCOPY;  Service: Endoscopy;  Laterality: N/A;  possible apc   ESOPHAGOGASTRODUODENOSCOPY (EGD) WITH PROPOFOL N/A 09/30/2017   Procedure: ESOPHAGOGASTRODUODENOSCOPY (EGD) WITH PROPOFOL;  Surgeon: Kerin Salen, MD;  Location: [facility] ENDOSCOPY;  Service: Gastroenterology;  Laterality: N/A;   ESOPHAGOGASTRODUODENOSCOPY (EGD) WITH PROPOFOL N/A 10/01/2017   Procedure:  ESOPHAGOGASTRODUODENOSCOPY (EGD) WITH PROPOFOL;  Surgeon: Kerin Salen, MD;  Location:  [facility] ENDOSCOPY;  Service: Gastroenterology;  Laterality: N/A;   ESOPHAGOGASTRODUODENOSCOPY (EGD) WITH PROPOFOL N/A 10/08/2017   Procedure: ESOPHAGOGASTRODUODENOSCOPY (EGD) WITH PROPOFOL;  Surgeon: Kathi Der, MD;  Location: [facility] ENDOSCOPY;  Service: Gastroenterology;  Laterality: N/A;   ESOPHAGOGASTRODUODENOSCOPY (EGD) WITH PROPOFOL N/A 10/17/2017   Procedure: ESOPHAGOGASTRODUODENOSCOPY (EGD) WITH PROPOFOL;  Surgeon: Kathi Der, MD;  Location: [facility] ENDOSCOPY;  Service: Gastroenterology;  Laterality: N/A;   ESOPHAGOGASTRODUODENOSCOPY (EGD) WITH PROPOFOL N/A 10/19/2017   Procedure: ESOPHAGOGASTRODUODENOSCOPY (EGD) WITH PROPOFOL;  Surgeon: Kathi Der, MD;  Location: [facility]OSCOPY;  Service: Gastroenterology;  Laterality: N/A;   ESOPHAGOGASTRODUODENOSCOPY (EGD) WITH PROPOFOL N/A 12/04/2018   Procedure: ESOPHAGOGASTRODUODENOSCOPY (EGD) WITH PROPOFOL;  Surgeon: Charlott Rakes, MD;  Location: [facility] ENDOSCOPY;  Service: Endoscopy;  Laterality: N/A;   GIVENS CAPSULE STUDY N/A 10/02/2017   Procedure: GIVENS CAPSULE STUDY;  Surgeon: Kerin Salen, MD;  Location: [facility] ENDOSCOPY;  Service: Gastroenterology;  Laterality: N/A;   GIVENS CAPSULE STUDY N/A 10/08/2017   Procedure: GIVENS CAPSULE STUDY;  Surgeon: Kathi Der, MD;  Location: [facility] ENDOSCOPY;  Service: Gastroenterology;  Laterality: N/A;  endoscopic placement of capsule   GIVENS CAPSULE STUDY N/A 03/02/2020   Procedure: GIVENS CAPSULE STUDY;  Surgeon: Bernette Redbird, MD;  Location: [facility] ENDOSCOPY;  Service: Endoscopy;  Laterality: N/A;   HEMOSTASIS CLIP PLACEMENT  12/04/2018   Procedure: HEMOSTASIS CLIP PLACEMENT;  Surgeon: Charlott Rakes, MD;  Location: [facility] ENDOSCOPY;  Service: Endoscopy;;   HEMOSTASIS CLIP PLACEMENT  05/27/2022   Procedure: HEMOSTASIS CLIP PLACEMENT;  Surgeon: Willis Modena, MD;  Location: [facility] ENDOSCOPY;  Service: Gastroenterology;;    HEMOSTASIS CLIP PLACEMENT  09/27/2022   Procedure: HEMOSTASIS CLIP PLACEMENT;  Surgeon: Lynann Bologna, DO;  Location: [facility] ENDOSCOPY;  Service: Gastroenterology;;   HEMOSTASIS CONTROL  05/27/2022   Procedure: HEMOSTASIS CONTROL;  Surgeon: Willis Modena, MD;  Location: [facility] ENDOSCOPY;  Service: Gastroenterology;;   HOT HEMOSTASIS N/A 04/27/2014   Procedure: HOT HEMOSTASIS (ARGON PLASMA COAGULATION/BICAP);  Surgeon: Florencia Reasons, MD;  Location: [facility] ENDOSCOPY;  Service: Endoscopy;  Laterality: N/A;   HOT HEMOSTASIS N/A 09/30/2017   Procedure: HOT HEMOSTASIS (ARGON PLASMA COAGULATION/BICAP);  Surgeon: Kerin Salen, MD;  Location: [facility] ENDOSCOPY;  Service: Gastroenterology;  Laterality: N/A;   HOT HEMOSTASIS N/A 10/01/2017   Procedure: HOT HEMOSTASIS (ARGON PLASMA COAGULATION/BICAP);  Surgeon: Kerin Salen, MD;  Location: [facility] ENDOSCOPY;  Service: Gastroenterology;  Laterality: N/A;   HOT HEMOSTASIS N/A 10/17/2017   Procedure: HOT HEMOSTASIS (ARGON PLASMA COAGULATION/BICAP);  Surgeon: Kathi Der, MD;  Location: [facility] ENDOSCOPY;  Service: Gastroenterology;  Laterality: N/A;   HOT HEMOSTASIS N/A 10/19/2017   Procedure: HOT HEMOSTASIS (ARGON PLASMA COAGULATION/BICAP);  Surgeon: Kathi Der, MD;  Location: [facility] ENDOSCOPY;  Service: Gastroenterology;  Laterality: N/A;   HOT HEMOSTASIS N/A 03/02/2020   Procedure: HOT HEMOSTASIS (ARGON PLASMA COAGULATION/BICAP);  Surgeon: Bernette Redbird, MD;  Location: [facility] ENDOSCOPY;  Service: Endoscopy;  Laterality: N/A;   HOT HEMOSTASIS N/A 04/12/2022   Procedure: HOT HEMOSTASIS (ARGON PLASMA COAGULATION/BICAP);  Surgeon: Vida Rigger, MD;  Location: [facility] ENDOSCOPY;  Service: Gastroenterology;  Laterality: N/A;   IR IMAGING GUIDED PORT INSERTION  07/08/2018   L shoulder Surgery  2011   POLYPECTOMY  03/02/2020   Procedure: POLYPECTOMY;  Surgeon: Bernette Redbird, MD;  Location: [facility] ENDOSCOPY;  Service: Endoscopy;;   Susa Day  11/03/2020   Procedure: Susa Day;  Surgeon:  Charlott Rakes, MD;  Location: [facility] ENDOSCOPY;  Service: Endoscopy;;   SUBMUCOSAL INJECTION  09/22/2017   Procedure: SUBMUCOSAL INJECTION;  Surgeon: Vida Rigger, MD;  Location: [facility] ENDOSCOPY;  Service: Endoscopy;;   SUBMUCOSAL INJECTION  12/04/2018   Procedure: SUBMUCOSAL INJECTION;  Surgeon: Charlott Rakes, MD;  Location: [facility] ENDOSCOPY;  Service: Endoscopy;;    Prior to Admission medications   Medication Sig Start Date End Date Taking? Authorizing Provider  Accu-Chek Softclix Lancets lancets Use to check blood sugar before breakfast and before dinner while on steroids Patient taking differently: 1 each by Other route See admin instructions. Use to check blood sugar before breakfast and before dinner while on steroids 09/27/19   Claudean Severance, MD  ALPRAZolam Prudy Feeler) 0.5 MG tablet TAKE 1 TABLET BY MOUTH AT BEDTIME AS NEEDED FOR ANXIETY OR SLEEP 11/04/22   Malachy Mood, MD  Aminocaproic Acid 1000 MG TABS Take 1 tablet (1,000 mg total) by mouth in the morning and at bedtime. 07/23/22   Malachy Mood, MD  amLODipine (NORVASC) 10 MG tablet TAKE 1 TABLET(10 MG) BY MOUTH DAILY Patient taking differently: Take 10 mg by mouth daily. 06/12/22   Pollyann Samples, NP  citalopram (CELEXA) 20 MG tablet Take 1 tablet (20 mg total) by mouth daily. 06/20/22 06/20/23  Nooruddin, Jason Fila, MD  glucose blood (ACCU-CHEK GUIDE) test strip Check blood sugar 2 times per day while on steroids before breakfast and dinner Patient taking differently: 1 each by Other route See admin instructions. Check blood sugar 2 times per day while on steroids before breakfast and dinner 09/27/19   Claudean Severance, MD  lidocaine-prilocaine (EMLA) cream Apply 1 application topically as needed. Patient not taking: Reported on 09/26/2022 07/17/18   Malachy Mood, MD  lisinopril (ZESTRIL) 20 MG tablet Take 1 tablet (20 mg total) by mouth daily. 06/20/22 06/20/23  Nooruddin, Jason Fila, MD  metFORMIN (GLUCOPHAGE) 500 MG tablet TAKE 1 TABLET(500 MG) BY MOUTH TWICE DAILY  WITH A MEAL Patient taking differently: Take 500 mg by mouth 2 (two) times daily with a meal. 11/21/21   Champ Mungo, DO  nicotine (NICODERM CQ - DOSED IN MG/24 HOURS) 14 mg/24hr patch Place 1 patch (14 mg total) onto the skin daily. Patient not taking: Reported on 09/26/2022 06/20/22 06/20/23  Nooruddin, Jason Fila, MD  ondansetron (ZOFRAN) 4 MG tablet Take 1 tablet (4 mg total) by mouth every 8 (eight) hours as needed for nausea or vomiting. 04/02/22   Malachy Mood, MD  pantoprazole (PROTONIX) 40 MG tablet TAKE 1 TABLET(40 MG) BY MOUTH TWICE DAILY 09/30/22   Malachy Mood, MD  PROAIR HFA 108 [phone] Base) MCG/ACT inhaler INHALE 1 TO 2 PUFFS INTO THE LUNGS EVERY 6 HOURS AS NEEDED FOR WHEEZING OR SHORTNESS OF BREATH Patient taking differently: Inhale 1-2 puffs into the lungs every 6 (six) hours as needed for wheezing or shortness of breath. 11/10/20   Malachy Mood, MD  traZODone (DESYREL) 100 MG tablet TAKE 1 TABLET(100 MG) BY MOUTH AT BEDTIME Patient taking differently: Take 100 mg by mouth at bedtime. TAKE 1 TABLET(100 MG) BY MOUTH AT BEDTIME 07/23/22   Malachy Mood, MD  TYLENOL PM EXTRA STRENGTH 500-25 MG TABS tablet Take 2 tablets by mouth at bedtime as needed (for sleep).    [provider]    Current Facility-Administered Medications  Medication Dose Route Frequency Provider Last Rate Last Admin   acetaminophen (TYLENOL) tablet 1,000 mg  1,000 mg Oral Q8H PRN Rocky Morel, DO       aminocaproic acid (AMICAR) tablet 1,000 mg  1,000 mg Oral BID Rocky Morel, DO   1,000 mg at 11/16/22 0941   amLODipine (NORVASC) tablet 10 mg  10 mg Oral  Daily Rocky Morel, DO   10 mg at 11/16/22 0941   citalopram (CELEXA) tablet 20 mg  20 mg Oral Daily Rocky Morel, DO   20 mg at 11/16/22 0941   insulin aspart (novoLOG) injection 0-9 Units  0-9 Units Subcutaneous TID WC Rocky Morel, DO       lisinopril (ZESTRIL) tablet 20 mg  20 mg Oral Daily Rocky Morel, DO   20 mg at 11/16/22 0941   [START ON 11/19/2022] metFORMIN  (GLUCOPHAGE) tablet 500 mg  500 mg Oral BID WC Rocky Morel, DO       pantoprazole (PROTONIX) injection 40 mg  40 mg Intravenous Q12H Rocky Morel, DO   40 mg at 11/16/22 1191   traZODone (DESYREL) tablet 100 mg  100 mg Oral QHS Rocky Morel, DO       Facility-Administered Medications Ordered in Other Encounters  Medication Dose Route Frequency Provider Last Rate Last Admin   diphenhydrAMINE (BENADRYL) 25 mg capsule            diphenhydrAMINE (BENADRYL) 25 mg capsule            phenylephrine (NEO-SYNEPHRINE) 1 % nasal drops 2 drop  2 drop Left Nare Once Malachy Mood, MD        Allergies as of 11/16/2022 - Reviewed 11/16/2022  Allergen Reaction Noted   Feraheme [ferumoxytol] Other (See Comments) 10/09/2017   Nsaids Other (See Comments) 04/25/2014   Tomato Hives 01/01/2012   Iron (ferrous sulfate) [ferrous sulfate er] Other (See Comments) 03/07/2020   Wasp venom Swelling 10/06/2017    Family History  Problem Relation Age of Onset   Cancer Mother        Ovarian   Ovarian cancer Mother    Diabetes Mother    Kidney disease Mother    Bleeding Disorder Mother    Diabetes type II Sister    Bleeding Disorder Sister    Diabetes Sister    Colon cancer Maternal Grandfather    Crohn's disease Maternal Grandfather    Stomach cancer Maternal Grandmother    Kidney disease Son    Bleeding Disorder Son    Dysmenorrhea Neg Hx     Social History   Socioeconomic History   Marital status: Widowed    Spouse name: Not on file   Number of children: 3   Years of education: Not on file   Highest education level: Not on file  Occupational History   Not on file  Tobacco Use   Smoking status: Every Day    Current packs/day: 0.50    Average packs/day: 0.5 packs/day for 30.0 years (15.0 ttl pk-yrs)    Types: Cigarettes   Smokeless tobacco: Former    Types: Snuff    Quit date: 32   Tobacco comments:    1/2 PPD  Vaping Use   Vaping status: Never Used  Substance and Sexual Activity    Alcohol use: No    Alcohol/week: 0.0 standard drinks of alcohol   Drug use: No    Comment: last cocaine-2010   Sexual activity: Yes    Birth control/protection: Surgical    Comment: Hysterectomy  Other Topics Concern   Not on file  Social History Narrative   Not on file   Social Determinants of Health   Financial Resource Strain: Not on file  Food Insecurity: Food Insecurity Present (11/16/2022)   Hunger Vital Sign    Worried About Running Out of Food in the Last Year: Sometimes true    Ran Out of  Food in the Last Year: Sometimes true  Transportation Needs: Unmet Transportation Needs (11/16/2022)   PRAPARE - Administrator, Civil Service (Medical): Yes    Lack of Transportation (Non-Medical): Yes  Physical Activity: Not on file  Stress: Not on file  Social Connections: Not on file  Intimate Partner Violence: Not At Risk (11/16/2022)   Humiliation, Afraid, Rape, and Kick questionnaire    Fear of Current or Ex-Partner: No    Emotionally Abused: No    Physically Abused: No    Sexually Abused: No    Review of Systems: As per HPI, all others negative  Physical Exam: Vital signs in last 24 hours: Temp:  [96.4 F (35.8 C)-98.1 F (36.7 C)] 97.9 F (36.6 C) (08/03 0810) Pulse Rate:  [53-68] 64 (08/03 0810) Resp:  [13-24] 17 (08/03 0810) BP: (111-179)/(59-86) 150/73 (08/03 0810) SpO2:  [95 %-100 %] 98 % (08/03 0810) Weight:  [101.6 kg] 101.6 kg (08/03 0810) Last BM Date : 11/16/22 General:   Alert, somnolent but arousable, and cooperative in NAD Head:  Normocephalic and atraumatic. Eyes:  Sclera clear, no icterus.   Conjunctiva pale Ears:  Normal auditory acuity. Nose:  No deformity, discharge,  or lesions. Mouth:  No deformity or lesions.  Oropharynx pale and dry Neck:  Supple; no masses or thyromegaly. Lungs:  No respiratory distress Abdomen:  Soft, nontender and nondistended. No masses, hepatosplenomegaly or hernias noted. Normal bowel sounds, without  guarding, and without rebound.     Msk:  Symmetrical without gross deformities. Normal posture. Pulses:  Normal pulses noted. Extremities:  Without clubbing or edema. Neurologic:  Alert and  oriented x4; diffusely weak, otherwise grossly normal neurologically. Skin:  Intact without significant lesions or rashes. Psych:  Alert and cooperative. Normal mood and affect.   Lab Results: Recent Labs    11/16/22 0228  WBC 9.9  HGB 6.6*  HCT 23.7*  PLT 274   BMET Recent Labs    11/16/22 0228  NA 140  K 2.8*  CL 108  CO2 20*  GLUCOSE 104*  BUN 8  CREATININE 1.07*  CALCIUM 8.5*   LFT Recent Labs    11/16/22 0228  PROT 6.0*  ALBUMIN 2.7*  AST 12*  ALT 8  ALKPHOS 64  BILITOT 0.4   PT/INR Recent Labs    11/16/22 0228  LABPROT 15.3*  INR 1.2    Studies/Results: CT ANGIO GI BLEED  Result Date: 11/16/2022 CLINICAL DATA:  Hematemesis, multiple episodes. Mild pain across abdomen. EXAM: CTA ABDOMEN AND PELVIS WITHOUT AND WITH CONTRAST TECHNIQUE: Multidetector CT imaging of the abdomen and pelvis was performed using the standard protocol during bolus administration of intravenous contrast. Multiplanar reconstructed images and MIPs were obtained and reviewed to evaluate the vascular anatomy. RADIATION DOSE REDUCTION: This exam was performed according to the departmental dose-optimization program which includes automated exposure control, adjustment of the mA and/or kV according to patient size and/or use of iterative reconstruction technique. CONTRAST:  75mL OMNIPAQUE IOHEXOL 350 MG/ML SOLN COMPARISON:  10/03/2022. FINDINGS: VASCULAR Aorta: Normal caliber aorta without aneurysm, dissection, vasculitis or significant stenosis. Aortic atherosclerosis. Celiac: Patent without evidence of aneurysm, dissection, vasculitis or significant stenosis. SMA: Patent without evidence of aneurysm, dissection, vasculitis or significant stenosis. Renals: Both renal arteries are patent without evidence of  aneurysm, dissection, vasculitis, fibromuscular dysplasia or significant stenosis. IMA: Patent without evidence of aneurysm, dissection, vasculitis or significant stenosis. Inflow: Patent without evidence of aneurysm, dissection, vasculitis or significant stenosis. Proximal Outflow: Bilateral common  femoral and visualized portions of the superficial and profunda femoral arteries are patent without evidence of aneurysm, dissection, vasculitis or significant stenosis. Veins: No obvious venous abnormality within the limitations of this arterial phase study. Review of the MIP images confirms the above findings. NON-VASCULAR Lower chest: Mild atelectasis or scarring is noted at the lung bases. Hepatobiliary: No focal liver abnormality is seen. Fatty infiltration of the liver is noted. A stone is present in the gallbladder. Pancreas: Unremarkable. No pancreatic ductal dilatation or surrounding inflammatory changes. Spleen: Normal in size without focal abnormality. Adrenals/Urinary Tract: The adrenal glands are within normal limits. The kidneys enhance symmetrically. There is a cyst in the right kidney. No renal calculus or hydronephrosis. Bladder is unremarkable. Stomach/Bowel: Surgical clips are noted in and surrounding the stomach and the stomach is mildly distended. No contrast extravasation is seen to suggest acute hemorrhage. No free air or pneumatosis. Appendix appears normal. No evidence of bowel wall thickening, distention, or inflammatory changes. Lymphatic: No abdominal or pelvic lymphadenopathy. Reproductive: Status post hysterectomy. No adnexal masses. Other: No abdominopelvic ascites. A small fat containing umbilical hernia is present. Musculoskeletal: Degenerative changes are present in the thoracolumbar spine. No acute osseous abnormality. IMPRESSION: VASCULAR 1. No evidence of active contrast extravasation to suggest active hemorrhage. 2. Aortic atherosclerosis. NON-VASCULAR 1. Mildly distended stomach  with debris. 2. Hepatic steatosis. 3. Cholelithiasis. Electronically Signed   By: Thornell Sartorius M.D.   On: 11/16/2022 03:46    Impression:   Hematemesis, resolved.  CT angiogram negative. Melena, resolved. HHT with multiple prior GI bleeds, multiple prior endoscopies. Acute blood loss anemia.  Plan:   Patient has not had any further bleeding since admission; she reports taking NSAIDs for generalized discomfort leading up to this admission. Would treat supportively:  Serial CBCs (transfuse if needed), NO NSAIDs, PPI bid, IVF. Clear liquids, advance as tolerated. Unless patient has recurrent bleeding, don't see indication for repeat endoscopy; patient has had innumerable endoscopies without any durable improvement or prevention of bleeding or anemia. [facility] will follow.   LOS: 0 days   , M  11/16/2022, 11:36 AM  Cell [phone] If no answer or after 5 PM call [phone]

## 2022-11-16 NOTE — ED Notes (Signed)
Stool incontinence care provided , adult brief /gown applied .

## 2022-11-16 NOTE — ED Provider Notes (Signed)
[place] [facility] DEPARTMENT AT [facility] Provider Note   CSN: [id number] Arrival date & time: 11/16/22  4098     History  Chief Complaint  Patient presents with   Hematemesis    Peggy House is a 62 y.o. female.  Patient presents to the emergency department via EMS complaining of multiple episodes of right red bloody emesis with large clots.  EMS had a phone image showing patient covered with multiple large areas of clotted blood. Patient was hypotensive upon EMS arrival with a BP of 75/40. EMS administered 500 mL of normal saline and Zofran and patient's blood pressure improved to 122/62.  Patient currently does not complain of nausea, complains of mild abdominal discomfort slightly worse in the left lower quadrant.  She denies chest pain, shortness of breath, lightheadedness, weakness at this time.  She does have past medical history significant multiple episodes of left GI bleed and most recently had upper endoscopy in June of this year.  Past medical history significant for acute on chronic blood loss anemia, GI bleeds, obesity, DVT.  Patient is not on a blood thinner.  HPI     Home Medications Prior to Admission medications   Medication Sig Start Date End Date Taking? Authorizing Provider  Accu-Chek Softclix Lancets lancets Use to check blood sugar before breakfast and before dinner while on steroids Patient taking differently: 1 each by Other route See admin instructions. Use to check blood sugar before breakfast and before dinner while on steroids 09/27/19   Claudean Severance, MD  ALPRAZolam Prudy Feeler) 0.5 MG tablet TAKE 1 TABLET BY MOUTH AT BEDTIME AS NEEDED FOR ANXIETY OR SLEEP 11/04/22   Malachy Mood, MD  Aminocaproic Acid 1000 MG TABS Take 1 tablet (1,000 mg total) by mouth in the morning and at bedtime. 07/23/22   Malachy Mood, MD  amLODipine (NORVASC) 10 MG tablet TAKE 1 TABLET(10 MG) BY MOUTH DAILY Patient taking differently: Take 10 mg by mouth daily. 06/12/22    Pollyann Samples, NP  citalopram (CELEXA) 20 MG tablet Take 1 tablet (20 mg total) by mouth daily. 06/20/22 06/20/23  Nooruddin, Jason Fila, MD  glucose blood (ACCU-CHEK GUIDE) test strip Check blood sugar 2 times per day while on steroids before breakfast and dinner Patient taking differently: 1 each by Other route See admin instructions. Check blood sugar 2 times per day while on steroids before breakfast and dinner 09/27/19   Claudean Severance, MD  lidocaine-prilocaine (EMLA) cream Apply 1 application topically as needed. Patient not taking: Reported on 09/26/2022 07/17/18   Malachy Mood, MD  lisinopril (ZESTRIL) 20 MG tablet Take 1 tablet (20 mg total) by mouth daily. 06/20/22 06/20/23  Nooruddin, Jason Fila, MD  metFORMIN (GLUCOPHAGE) 500 MG tablet TAKE 1 TABLET(500 MG) BY MOUTH TWICE DAILY WITH A MEAL Patient taking differently: Take 500 mg by mouth 2 (two) times daily with a meal. 11/21/21   Champ Mungo, DO  nicotine (NICODERM CQ - DOSED IN MG/24 HOURS) 14 mg/24hr patch Place 1 patch (14 mg total) onto the skin daily. Patient not taking: Reported on 09/26/2022 06/20/22 06/20/23  Nooruddin, Jason Fila, MD  ondansetron (ZOFRAN) 4 MG tablet Take 1 tablet (4 mg total) by mouth every 8 (eight) hours as needed for nausea or vomiting. 04/02/22   Malachy Mood, MD  pantoprazole (PROTONIX) 40 MG tablet TAKE 1 TABLET(40 MG) BY MOUTH TWICE DAILY 09/30/22   Malachy Mood, MD  PROAIR HFA 108 [phone] Base) MCG/ACT inhaler INHALE 1 TO 2 PUFFS INTO THE  LUNGS EVERY 6 HOURS AS NEEDED FOR WHEEZING OR SHORTNESS OF BREATH Patient taking differently: Inhale 1-2 puffs into the lungs every 6 (six) hours as needed for wheezing or shortness of breath. 11/10/20   Malachy Mood, MD  traZODone (DESYREL) 100 MG tablet TAKE 1 TABLET(100 MG) BY MOUTH AT BEDTIME Patient taking differently: Take 100 mg by mouth at bedtime. TAKE 1 TABLET(100 MG) BY MOUTH AT BEDTIME 07/23/22   Malachy Mood, MD  TYLENOL PM EXTRA STRENGTH 500-25 MG TABS tablet Take 2 tablets by mouth at bedtime as needed  (for sleep).    [provider]      Allergies    Feraheme [ferumoxytol], Nsaids, Tomato, Iron (ferrous sulfate) [ferrous sulfate er], and Wasp venom    Review of Systems   Review of Systems  Physical Exam Updated Vital Signs BP 125/64   Pulse 61   Temp (!) 96.4 F (35.8 C) (Temporal)   Resp 16   SpO2 95%  Physical Exam Vitals and nursing note reviewed.  Constitutional:      General: She is not in acute distress.    Appearance: She is well-developed.  HENT:     Head: Normocephalic and atraumatic.  Eyes:     Conjunctiva/sclera: Conjunctivae normal.  Cardiovascular:     Rate and Rhythm: Normal rate and regular rhythm.     Heart sounds: No murmur heard. Pulmonary:     Effort: Pulmonary effort is normal. No respiratory distress.     Breath sounds: Normal breath sounds.  Abdominal:     Palpations: Abdomen is soft.     Tenderness: There is abdominal tenderness (Mild generalized abdominal tenderness).  Musculoskeletal:        General: No swelling.     Cervical back: Neck supple.  Skin:    General: Skin is warm and dry.     Capillary Refill: Capillary refill takes less than 2 seconds.  Neurological:     General: No focal deficit present.     Mental Status: She is alert and oriented to person, place, and time.  Psychiatric:        Mood and Affect: Mood normal.     ED Results / Procedures / Treatments   Labs (all labs ordered are listed, but only abnormal results are displayed) Labs Reviewed  CBC WITH DIFFERENTIAL/PLATELET - Abnormal; Notable for the following components:      Result Value   RBC 2.86 (*)    Hemoglobin 6.6 (*)    HCT 23.7 (*)    MCH 23.1 (*)    MCHC 27.8 (*)    RDW 23.4 (*)    All other components within normal limits  COMPREHENSIVE METABOLIC PANEL - Abnormal; Notable for the following components:   Potassium 2.8 (*)    CO2 20 (*)    Glucose, Bld 104 (*)    Creatinine, Ser 1.07 (*)    Calcium 8.5 (*)    Total Protein 6.0 (*)     Albumin 2.7 (*)    AST 12 (*)    GFR, Estimated 59 (*)    All other components within normal limits  PROTIME-INR - Abnormal; Notable for the following components:   Prothrombin Time 15.3 (*)    All other components within normal limits  TYPE AND SCREEN  PREPARE RBC (CROSSMATCH)    EKG None  Radiology CT ANGIO GI BLEED  Result Date: 11/16/2022 CLINICAL DATA:  Hematemesis, multiple episodes. Mild pain across abdomen. EXAM: CTA ABDOMEN AND PELVIS WITHOUT AND WITH CONTRAST TECHNIQUE:  Multidetector CT imaging of the abdomen and pelvis was performed using the standard protocol during bolus administration of intravenous contrast. Multiplanar reconstructed images and MIPs were obtained and reviewed to evaluate the vascular anatomy. RADIATION DOSE REDUCTION: This exam was performed according to the departmental dose-optimization program which includes automated exposure control, adjustment of the mA and/or kV according to patient size and/or use of iterative reconstruction technique. CONTRAST:  75mL OMNIPAQUE IOHEXOL 350 MG/ML SOLN COMPARISON:  10/03/2022. FINDINGS: VASCULAR Aorta: Normal caliber aorta without aneurysm, dissection, vasculitis or significant stenosis. Aortic atherosclerosis. Celiac: Patent without evidence of aneurysm, dissection, vasculitis or significant stenosis. SMA: Patent without evidence of aneurysm, dissection, vasculitis or significant stenosis. Renals: Both renal arteries are patent without evidence of aneurysm, dissection, vasculitis, fibromuscular dysplasia or significant stenosis. IMA: Patent without evidence of aneurysm, dissection, vasculitis or significant stenosis. Inflow: Patent without evidence of aneurysm, dissection, vasculitis or significant stenosis. Proximal Outflow: Bilateral common femoral and visualized portions of the superficial and profunda femoral arteries are patent without evidence of aneurysm, dissection, vasculitis or significant stenosis. Veins: No obvious  venous abnormality within the limitations of this arterial phase study. Review of the MIP images confirms the above findings. NON-VASCULAR Lower chest: Mild atelectasis or scarring is noted at the lung bases. Hepatobiliary: No focal liver abnormality is seen. Fatty infiltration of the liver is noted. A stone is present in the gallbladder. Pancreas: Unremarkable. No pancreatic ductal dilatation or surrounding inflammatory changes. Spleen: Normal in size without focal abnormality. Adrenals/Urinary Tract: The adrenal glands are within normal limits. The kidneys enhance symmetrically. There is a cyst in the right kidney. No renal calculus or hydronephrosis. Bladder is unremarkable. Stomach/Bowel: Surgical clips are noted in and surrounding the stomach and the stomach is mildly distended. No contrast extravasation is seen to suggest acute hemorrhage. No free air or pneumatosis. Appendix appears normal. No evidence of bowel wall thickening, distention, or inflammatory changes. Lymphatic: No abdominal or pelvic lymphadenopathy. Reproductive: Status post hysterectomy. No adnexal masses. Other: No abdominopelvic ascites. A small fat containing umbilical hernia is present. Musculoskeletal: Degenerative changes are present in the thoracolumbar spine. No acute osseous abnormality. IMPRESSION: VASCULAR 1. No evidence of active contrast extravasation to suggest active hemorrhage. 2. Aortic atherosclerosis. NON-VASCULAR 1. Mildly distended stomach with debris. 2. Hepatic steatosis. 3. Cholelithiasis. Electronically Signed   By: Thornell Sartorius M.D.   On: 11/16/2022 03:46    Procedures .Critical Care  Performed by: Darrick Grinder, PA-C Authorized by: Darrick Grinder, PA-C   Critical care provider statement:    Critical care time (minutes):  30   Critical care was time spent personally by me on the following activities:  Development of treatment plan with patient or surrogate, discussions with consultants, evaluation of  patient's response to treatment, examination of patient, ordering and review of laboratory studies, ordering and review of radiographic studies, ordering and performing treatments and interventions, pulse oximetry, re-evaluation of patient's condition and review of old charts   Care discussed with: admitting provider       Medications Ordered in ED Medications  0.9 %  sodium chloride infusion (Manually program via Guardrails IV Fluids) ( Intravenous New Bag/Given 11/16/22 0409)  iohexol (OMNIPAQUE) 350 MG/ML injection 75 mL (75 mLs Intravenous Contrast Given 11/16/22 0332)  potassium chloride SA (KLOR-CON M) CR tablet 40 mEq (40 mEq Oral Given 11/16/22 0407)    ED Course/ Medical Decision Making/ A&P  Medical Decision Making Amount and/or Complexity of Data Reviewed Labs: ordered. Radiology: ordered.  Risk Prescription drug management.   This patient presents to the ED for concern of hematemesis, this involves an extensive number of treatment options, and is a complaint that carries with it a high risk of complications and morbidity.  The differential diagnosis includes upper GI bleed, lower GI bleed, Mallory-Weiss tear, others   Co morbidities that complicate the patient evaluation  History of multiple GI bleeds   Additional history obtained:  Additional history obtained from EMS External records from outside source obtained and reviewed including upper endoscopy report from June 14.  2 cm hiatal hernia, nonbleeding gastric ulcer with possible AVM involvement.  Clips were placed.  Endo Clip found in stomach.  Normal examined duodenum.   Lab Tests:  I Ordered, and personally interpreted labs.  The pertinent results include: Hemoglobin 6.6, potassium 2.8   Imaging Studies ordered:  I ordered imaging studies including CT angio GI bleed I independently visualized and interpreted imaging which showed  1. No evidence of active contrast extravasation  to suggest active  hemorrhage.  2. Aortic atherosclerosis.    NON-VASCULAR    1. Mildly distended stomach with debris.  2. Hepatic steatosis.  3. Cholelithiasis.   I agree with the radiologist interpretation    Consultations Obtained:  I requested consultation with the internal medicine resident,  and discussed lab and imaging findings as well as pertinent plan - they recommend: Admission   Problem List / ED Course / Critical interventions / Medication management   I ordered medication including PRBC for blood loss anemia, potassium for hypokalemia Reevaluation of the patient after these medicines showed that the patient improved I have reviewed the patients home medicines and have made adjustments as needed   Social Determinants of Health:  Patient has Medicaid for primary insurance type   Test / Admission - Considered:  Patient with blood loss anemia requiring blood transfusion, hypokalemia.  Patient needs admission for further evaluation and management of likely GI bleed/hematemesis and anemia.         Final Clinical Impression(s) / ED Diagnoses Final diagnoses:  Hematemesis with nausea  Acute blood loss anemia (ABLA)  Hypokalemia    Rx / DC Orders ED Discharge Orders     None         Pamala Duffel 11/16/22 0414    Palumbo, April, MD 11/16/22 [phone]

## 2022-11-16 NOTE — ED Notes (Signed)
ED TO INPATIENT HANDOFF REPORT  ED Nurse Name and Phone #:  Lucious Groves [phone]  S Name/Age/Gender [name] 62 y.o. female Room/Bed: RESUSC/RESUSC  Code Status   Code Status: DNR  Home/SNF/Other Home Patient oriented to: self, place, time, and situation Is this baseline? Yes   Triage Complete: Triage complete  Chief Complaint Symptomatic anemia [D64.9]  Triage Note Patient arrived with EMS from home reports multiple episodes of bright red bloody emesis with clots this evening . Mild pain across abdomen , CBG= 127, initially hypotensive at home 75/40 , received NS 500 ml IV and Zofran 4 mg IV by EMS . BP improved to 122/62 at arrival .    Allergies Allergies  Allergen Reactions   Feraheme [Ferumoxytol] Other (See Comments)    Muscle tetany, encephalopathy, fatigue, nausea (reaction to injection)   Nsaids Other (See Comments)    Stomach bleeding episodes; Tylenol is OK   Tomato Hives   Iron (Ferrous Sulfate) [Ferrous Sulfate Er] Other (See Comments)    Shock   Wasp Venom Swelling    Level of Care/Admitting Diagnosis ED Disposition     ED Disposition  Admit   Condition  --   Comment  Hospital Area: [facility] [[id number]]  Level of Care: Telemetry Medical [[id number]]  May place patient in observation at [facility] or [facility] if equivalent level of care is available:: Yes  Covid Evaluation: Asymptomatic - no recent exposure (last 10 days) testing not required  Diagnosis: Symptomatic anemia [[id number]]  Admitting Physician: Inez Catalina [[id number]]  Attending Physician: Nena Polio          B Medical/Surgery History Past Medical History:  Diagnosis Date   Anxiety    Arthritis    knnes,back   GERD (gastroesophageal reflux disease)    Hereditary hemorrhagic telangiectasia (HCC)    History of swelling of feet    Hyperlipidemia    Hypertension    Major depressive disorder, recurrent episode (HCC) 06/05/2015   Obesity     Snores    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   Past Surgical History:  Procedure Laterality Date   ABDOMINAL HYSTERECTOMY     CARPAL TUNNEL RELEASE  05/13/2011   Procedure: CARPAL TUNNEL RELEASE;  Surgeon: Mable Paris, MD;  Location: [facility];  Service: Orthopedics;  Laterality: Left;   COLONOSCOPY N/A 03/02/2020   Procedure: COLONOSCOPY;  Surgeon: Bernette Redbird, MD;  Location: [facility] ENDOSCOPY;  Service: Endoscopy;  Laterality: N/A;   COLONOSCOPY WITH PROPOFOL N/A 04/28/2014   Procedure: COLONOSCOPY WITH PROPOFOL;  Surgeon: Florencia Reasons, MD;  Location: [facility] ENDOSCOPY;  Service: Endoscopy;  Laterality: N/A;   DG TOES*L*  2/10   rt   DILATION AND CURETTAGE OF UTERUS     ENTEROSCOPY N/A 10/17/2017   Procedure: ENTEROSCOPY;  Surgeon: Kathi Der, MD;  Location: [facility] ENDOSCOPY;  Service: Gastroenterology;  Laterality: N/A;   ESOPHAGOGASTRODUODENOSCOPY N/A 04/10/2014   Procedure: ESOPHAGOGASTRODUODENOSCOPY (EGD);  Surgeon: Shirley Friar, MD;  Location: [facility] ENDOSCOPY;  Service: Endoscopy;  Laterality: N/A;   ESOPHAGOGASTRODUODENOSCOPY N/A 05/10/2017   Procedure: ESOPHAGOGASTRODUODENOSCOPY (EGD);  Surgeon: Bernette Redbird, MD;  Location: [facility] ENDOSCOPY;  Service: Endoscopy;  Laterality: N/A;   ESOPHAGOGASTRODUODENOSCOPY N/A 09/22/2017   Procedure: ESOPHAGOGASTRODUODENOSCOPY (EGD);  Surgeon: Vida Rigger, MD;  Location: [facility] ENDOSCOPY;  Service: Endoscopy;  Laterality: N/A;  bedside   ESOPHAGOGASTRODUODENOSCOPY N/A 03/02/2020   Procedure: ESOPHAGOGASTRODUODENOSCOPY (EGD);  Surgeon: Bernette Redbird, MD;  Location:  [facility] ENDOSCOPY;  Service: Endoscopy;  Laterality: N/A;   ESOPHAGOGASTRODUODENOSCOPY N/A 11/03/2020   Procedure: ESOPHAGOGASTRODUODENOSCOPY (EGD);  Surgeon: Charlott Rakes, MD;  Location: [facility] ENDOSCOPY;  Service: Endoscopy;  Laterality: N/A;   ESOPHAGOGASTRODUODENOSCOPY N/A 04/12/2022   Procedure: ESOPHAGOGASTRODUODENOSCOPY (EGD);   Surgeon: Vida Rigger, MD;  Location: [facility] ENDOSCOPY;  Service: Gastroenterology;  Laterality: N/A;   ESOPHAGOGASTRODUODENOSCOPY N/A 05/27/2022   Procedure: ESOPHAGOGASTRODUODENOSCOPY (EGD);  Surgeon: Willis Modena, MD;  Location: [facility] ENDOSCOPY;  Service: Gastroenterology;  Laterality: N/A;   ESOPHAGOGASTRODUODENOSCOPY N/A 09/27/2022   Procedure: ESOPHAGOGASTRODUODENOSCOPY (EGD);  Surgeon: Lynann Bologna, DO;  Location: [facility] ENDOSCOPY;  Service: Gastroenterology;  Laterality: N/A;   ESOPHAGOGASTRODUODENOSCOPY (EGD) WITH PROPOFOL N/A 04/27/2014   Procedure: ESOPHAGOGASTRODUODENOSCOPY (EGD) WITH PROPOFOL;  Surgeon: Florencia Reasons, MD;  Location: [facility] ENDOSCOPY;  Service: Endoscopy;  Laterality: N/A;  possible apc   ESOPHAGOGASTRODUODENOSCOPY (EGD) WITH PROPOFOL N/A 09/30/2017   Procedure: ESOPHAGOGASTRODUODENOSCOPY (EGD) WITH PROPOFOL;  Surgeon: Kerin Salen, MD;  Location: [facility] ENDOSCOPY;  Service: Gastroenterology;  Laterality: N/A;   ESOPHAGOGASTRODUODENOSCOPY (EGD) WITH PROPOFOL N/A 10/01/2017   Procedure: ESOPHAGOGASTRODUODENOSCOPY (EGD) WITH PROPOFOL;  Surgeon: Kerin Salen, MD;  Location: [facility] ENDOSCOPY;  Service: Gastroenterology;  Laterality: N/A;   ESOPHAGOGASTRODUODENOSCOPY (EGD) WITH PROPOFOL N/A 10/08/2017   Procedure: ESOPHAGOGASTRODUODENOSCOPY (EGD) WITH PROPOFOL;  Surgeon: Kathi Der, MD;  [facility] ENDOSCOPY;  Service: Gastroenterology;  Laterality: N/A;   ESOPHAGOGASTRODUODENOSCOPY (EGD) WITH PROPOFOL N/A 10/17/2017   Procedure: ESOPHAGOGASTRODUODENOSCOPY (EGD) WITH PROPOFOL;  Surgeon: Kathi Der, MD;  Location[facility] ENDOSCOPY;  Service: Gastroenterology;  Laterality: N/A;   ESOPHAGOGASTRODUODENOSCOPY (EGD) WITH PROPOFOL N/A 10/19/2017   Procedure: ESOPHAGOGASTRODUODENOSCOPY (EGD) WITH PROPOFOL;  Surgeon: Kathi Der, MD;  [facility]OSCOPY;  Service: Gastroenterology;  Laterality: N/A;   ESOPHAGOGASTRODUODENOSCOPY (EGD) WITH PROPOFOL N/A 12/04/2018   Procedure:  ESOPHAGOGASTRODUODENOSCOPY (EGD) WITH PROPOFOL;  Surgeon: Charlott Rakes, MD;  Location[facility] ENDOSCOPY;  Service: Endoscopy;  Laterality: N/A;   GIVENS CAPSULE STUDY N/A 10/02/2017   Procedure: GIVENS CAPSULE STUDY;  Surgeon: Kerin Salen, MD;  Location: [facility] ENDOSCOPY;  Service: Gastroenterology;  Laterality: N/A;   GIVENS CAPSULE STUDY N/A 10/08/2017   Procedure: GIVENS CAPSULE STUDY;  Surgeon: Kathi Der, MD;  Location: [facility] ENDOSCOPY;  Service: Gastroenterology;  Laterality: N/A;  endoscopic placement of capsule   GIVENS CAPSULE STUDY N/A 03/02/2020   Procedure: GIVENS CAPSULE STUDY;  Surgeon: Bernette Redbird, MD;  Location: [facility] ENDOSCOPY;  Service: Endoscopy;  Laterality: N/A;   HEMOSTASIS CLIP PLACEMENT  12/04/2018   Procedure: HEMOSTASIS CLIP PLACEMENT;  Surgeon: Charlott Rakes, MD;  Location[facility] ENDOSCOPY;  Service: Endoscopy;;   HEMOSTASIS CLIP PLACEMENT  05/27/2022   Procedure: HEMOSTASIS CLIP PLACEMENT;  Surgeon: Willis Modena, MD;  Location: [facility] ENDOSCOPY;  Service: Gastroenterology;;   HEMOSTASIS CLIP PLACEMENT  09/27/2022   Procedure: HEMOSTASIS CLIP PLACEMENT;  Surgeon: Lynann Bologna, DO;  Location: [facility] ENDOSCOPY;  Service: Gastroenterology;;   HEMOSTASIS CONTROL  05/27/2022   Procedure: HEMOSTASIS CONTROL;  Surgeon: Willis Modena, MD;  [facility] ENDOSCOPY;  Service: Gastroenterology;;   HOT HEMOSTASIS N/A 04/27/2014   Procedure: HOT HEMOSTASIS (ARGON PLASMA COAGULATION/BICAP);  Surgeon: Florencia Reasons, MD;  Location: [facility] ENDOSCOPY;  Service: Endoscopy;  Laterality: N/A;   HOT HEMOSTASIS N/A 09/30/2017   Procedure: HOT HEMOSTASIS (ARGON PLASMA COAGULATION/BICAP);  Surgeon: Kerin Salen, MD;  Location: [facility] ENDOSCOPY;  Service: Gastroenterology;  Laterality: N/A;   HOT HEMOSTASIS N/A 10/01/2017   Procedure: HOT HEMOSTASIS (ARGON PLASMA COAGULATION/BICAP);  Surgeon: Kerin Salen, MD;  Location: [facility] ENDOSCOPY;  Service: Gastroenterology;  Laterality:  N/A;   HOT HEMOSTASIS N/A  10/17/2017   Procedure: HOT HEMOSTASIS (ARGON PLASMA COAGULATION/BICAP);  Surgeon: Kathi Der, MD;  Location: [facility] ENDOSCOPY;  Service: Gastroenterology;  Laterality: N/A;   HOT HEMOSTASIS N/A 10/19/2017   Procedure: HOT HEMOSTASIS (ARGON PLASMA COAGULATION/BICAP);  Surgeon: Kathi Der, MD;  Location: [facility] ENDOSCOPY;  Service: Gastroenterology;  Laterality: N/A;   HOT HEMOSTASIS N/A 03/02/2020   Procedure: HOT HEMOSTASIS (ARGON PLASMA COAGULATION/BICAP);  Surgeon: Bernette Redbird, MD;  Location: [facility] ENDOSCOPY;  Service: Endoscopy;  Laterality: N/A;   HOT HEMOSTASIS N/A 04/12/2022   Procedure: HOT HEMOSTASIS (ARGON PLASMA COAGULATION/BICAP);  Surgeon: Vida Rigger, MD;  Location: [facility] ENDOSCOPY;  Service: Gastroenterology;  Laterality: N/A;   IR IMAGING GUIDED PORT INSERTION  07/08/2018   L shoulder Surgery  2011   POLYPECTOMY  03/02/2020   Procedure: POLYPECTOMY;  Surgeon: Bernette Redbird, MD;  Location[facility] ENDOSCOPY;  Service: Endoscopy;;   SCLEROTHERAPY  11/03/2020   Procedure: Susa Day;  Surgeon: Charlott Rakes, MD;  Location: [facility] ENDOSCOPY;  Service: Endoscopy;;   SUBMUCOSAL INJECTION  09/22/2017   Procedure: SUBMUCOSAL INJECTION;  Surgeon: Vida Rigger, MD;  Location: [facility] ENDOSCOPY;  Service: Endoscopy;;   SUBMUCOSAL INJECTION  12/04/2018   Procedure: SUBMUCOSAL INJECTION;  Surgeon: Charlott Rakes, MD;  Location: [facility] ENDOSCOPY;  Service: Endoscopy;;     A IV Location/Drains/Wounds Patient Lines/Drains/Airways Status     Active Line/Drains/Airways     Name Placement date Placement time Site Days   Implanted Port 07/08/18 Right Chest 07/08/18  --  Chest  1592   Peripheral IV 11/16/22 18 G Left Wrist 11/16/22  --  Wrist  less than 1   Peripheral IV 11/16/22 18 G Right Antecubital 11/16/22  0238  Antecubital  less than 1            Intake/Output Last 24 hours  Intake/Output Summary (Last 24 hours) at 11/16/2022 4540 Last data filed at 11/16/2022 0505 Gross per 24 hour   Intake 315 ml  Output --  Net 315 ml    Labs/Imaging Results for orders placed or performed during the hospital encounter of 11/16/22 (from the past 48 hour(s))  Type and screen [facility]     Status: None (Preliminary result)   Collection Time: 11/16/22  2:20 AM  Result Value Ref Range   ABO/RH(D) A NEG    Antibody Screen NEG    Sample Expiration 11/19/2022,2359    Unit Number [id number]    Blood Component Type RED CELLS,LR    Unit division 00    Status of Unit ISSUED    Transfusion Status OK TO TRANSFUSE    Crossmatch Result      Compatible Performed at [facility], [address], [place], [place] [address]   CBC with Differential     Status: Abnormal   Collection Time: 11/16/22  2:28 AM  Result Value Ref Range   WBC 9.9 4.0 - 10.5 K/uL   RBC 2.86 (L) 3.87 - 5.11 MIL/uL   Hemoglobin 6.6 (LL) 12.0 - 15.0 g/dL    Comment: REPEATED TO VERIFY THIS CRITICAL RESULT HAS VERIFIED AND BEEN CALLED TO BOBBY SINGALING RN BY SHERRY GALLOWAY ON 08 03 2024 AT 0249, AND HAS BEEN READ BACK.     HCT 23.7 (L) 36.0 - 46.0 %   MCV 82.9 80.0 - 100.0 fL   MCH 23.1 (L) 26.0 - 34.0 pg   MCHC 27.8 (L) 30.0 - 36.0 g/dL   RDW 65.7 (H) 84.6 - 96.2 %  Platelets 274 150 - 400 K/uL   nRBC 0.0 0.0 - 0.2 %   Neutrophils Relative % 78 %   Neutro Abs 7.6 1.7 - 7.7 K/uL   Lymphocytes Relative 15 %   Lymphs Abs 1.5 0.7 - 4.0 K/uL   Monocytes Relative 5 %   Monocytes Absolute 0.5 0.1 - 1.0 K/uL   Eosinophils Relative 1 %   Eosinophils Absolute 0.1 0.0 - 0.5 K/uL   Basophils Relative 1 %   Basophils Absolute 0.1 0.0 - 0.1 K/uL   Immature Granulocytes 0 %   Abs Immature Granulocytes 0.04 0.00 - 0.07 K/uL    Comment: Performed at [facility], [address] [place], [place] [address]  Comprehensive metabolic panel     Status: Abnormal   Collection Time: 11/16/22  2:28 AM  Result Value Ref Range   Sodium 140 135 - 145 mmol/L   Potassium 2.8 (L) 3.5 - 5.1 mmol/L    Chloride 108 98 - 111 mmol/L   CO2 20 (L) 22 - 32 mmol/L   Glucose, Bld 104 (H) 70 - 99 mg/dL    Comment: Glucose reference range applies only to samples taken after fasting for at least 8 hours.   BUN 8 8 - 23 mg/dL   Creatinine, Ser 6.04 (H) 0.44 - 1.00 mg/dL   Calcium 8.5 (L) 8.9 - 10.3 mg/dL   Total Protein 6.0 (L) 6.5 - 8.1 g/dL   Albumin 2.7 (L) 3.5 - 5.0 g/dL   AST 12 (L) 15 - 41 U/L   ALT 8 0 - 44 U/L   Alkaline Phosphatase 64 38 - 126 U/L   Total Bilirubin 0.4 0.3 - 1.2 mg/dL   GFR, Estimated 59 (L) >60 mL/min    Comment: (NOTE) Calculated using the CKD-EPI Creatinine Equation (2021)    Anion gap 12 5 - 15    Comment: Performed at [facility], [address] [place], [place] [address]  Protime-INR     Status: Abnormal   Collection Time: 11/16/22  2:28 AM  Result Value Ref Range   Prothrombin Time 15.3 (H) 11.4 - 15.2 seconds   INR 1.2 0.8 - 1.2    Comment: (NOTE) INR goal varies based on device and disease states. Performed at [facility], [address], [place], [place] [address]   Prepare RBC (crossmatch)     Status: None   Collection Time: 11/16/22  3:01 AM  Result Value Ref Range   Order Confirmation      ORDER PROCESSED BY BLOOD BANK Performed at [facility], [address] [place], [place] [address]    *Note: Due to a large number of results and/or encounters for the requested time period, some results have not been displayed. A complete set of results can be found in Results Review.   CT ANGIO GI BLEED  Result Date: 11/16/2022 CLINICAL DATA:  Hematemesis, multiple episodes. Mild pain across abdomen. EXAM: CTA ABDOMEN AND PELVIS WITHOUT AND WITH CONTRAST TECHNIQUE: Multidetector CT imaging of the abdomen and pelvis was performed using the standard protocol during bolus administration of intravenous contrast. Multiplanar reconstructed images and MIPs were obtained and reviewed to evaluate the vascular anatomy. RADIATION DOSE REDUCTION:  This exam was performed according to the departmental dose-optimization program which includes automated exposure control, adjustment of the mA and/or kV according to patient size and/or use of iterative reconstruction technique. CONTRAST:  75mL OMNIPAQUE IOHEXOL 350 MG/ML SOLN COMPARISON:  10/03/2022. FINDINGS: VASCULAR  Aorta: Normal caliber aorta without aneurysm, dissection, vasculitis or significant stenosis. Aortic atherosclerosis. Celiac: Patent without evidence of aneurysm, dissection, vasculitis or significant stenosis. SMA: Patent without evidence of aneurysm, dissection, vasculitis or significant stenosis. Renals: Both renal arteries are patent without evidence of aneurysm, dissection, vasculitis, fibromuscular dysplasia or significant stenosis. IMA: Patent without evidence of aneurysm, dissection, vasculitis or significant stenosis. Inflow: Patent without evidence of aneurysm, dissection, vasculitis or significant stenosis. Proximal Outflow: Bilateral common femoral and visualized portions of the superficial and profunda femoral arteries are patent without evidence of aneurysm, dissection, vasculitis or significant stenosis. Veins: No obvious venous abnormality within the limitations of this arterial phase study. Review of the MIP images confirms the above findings. NON-VASCULAR Lower chest: Mild atelectasis or scarring is noted at the lung bases. Hepatobiliary: No focal liver abnormality is seen. Fatty infiltration of the liver is noted. A stone is present in the gallbladder. Pancreas: Unremarkable. No pancreatic ductal dilatation or surrounding inflammatory changes. Spleen: Normal in size without focal abnormality. Adrenals/Urinary Tract: The adrenal glands are within normal limits. The kidneys enhance symmetrically. There is a cyst in the right kidney. No renal calculus or hydronephrosis. Bladder is unremarkable. Stomach/Bowel: Surgical clips are noted in and surrounding the stomach and the stomach is  mildly distended. No contrast extravasation is seen to suggest acute hemorrhage. No free air or pneumatosis. Appendix appears normal. No evidence of bowel wall thickening, distention, or inflammatory changes. Lymphatic: No abdominal or pelvic lymphadenopathy. Reproductive: Status post hysterectomy. No adnexal masses. Other: No abdominopelvic ascites. A small fat containing umbilical hernia is present. Musculoskeletal: Degenerative changes are present in the thoracolumbar spine. No acute osseous abnormality. IMPRESSION: VASCULAR 1. No evidence of active contrast extravasation to suggest active hemorrhage. 2. Aortic atherosclerosis. NON-VASCULAR 1. Mildly distended stomach with debris. 2. Hepatic steatosis. 3. Cholelithiasis. Electronically Signed   By: Thornell Sartorius M.D.   On: 11/16/2022 03:46    Pending Labs Unresulted Labs (From admission, onward)    None       Vitals/Pain Today's Vitals   11/16/22 0440 11/16/22 0440 11/16/22 0445 11/16/22 0505  BP:   (!) 164/69 (!) 144/70  Pulse: 60  60 68  Resp: 14  13 18   Temp:    (!) 96.8 F (36 C)  TempSrc:    Temporal  SpO2: 100%  100%   PainSc:  0-No pain      Isolation Precautions No active isolations  Medications Medications  citalopram (CELEXA) tablet 20 mg (has no administration in time range)  traZODone (DESYREL) tablet 100 mg (has no administration in time range)  acetaminophen (TYLENOL) tablet 1,000 mg (has no administration in time range)  metFORMIN (GLUCOPHAGE) tablet 500 mg (has no administration in time range)  ondansetron (ZOFRAN) tablet 4 mg (has no administration in time range)  pantoprazole (PROTONIX) injection 40 mg (has no administration in time range)  Aminocaproic Acid TABS 1,000 mg (has no administration in time range)  insulin aspart (novoLOG) injection 0-9 Units (has no administration in time range)  0.9 %  sodium chloride infusion (Manually program via Guardrails IV Fluids) ( Intravenous New Bag/Given 11/16/22 0409)   iohexol (OMNIPAQUE) 350 MG/ML injection 75 mL (75 mLs Intravenous Contrast Given 11/16/22 0332)  potassium chloride SA (KLOR-CON M) CR tablet 40 mEq (40 mEq Oral Given 11/16/22 0407)    Mobility walks     Focused Assessments     R Recommendations: See Admitting Provider Note  Report given to:   Additional Notes:

## 2022-11-16 NOTE — ED Notes (Signed)
1st unit PRBC infusing with no adverse effect , IV sites intact , afebrile /respirations unlabored . Admitting MD at bedside .

## 2022-11-16 NOTE — Plan of Care (Signed)

## 2022-11-16 NOTE — ED Notes (Signed)
Patient signed consent form for blood transfusion . 

## 2022-11-16 NOTE — ED Notes (Signed)
Patient transported to CT scan . 

## 2022-11-16 NOTE — Hospital Course (Addendum)
Peggy House is a 62 y.o. year old female currently admitted on the IMTS HD#0 for acute blood loss anemia in the setting of HHT.   #Acute on Chronic Symptomatic Anemia HHT She presented with 1 episode of significant hematemesis of bright red blood. CT angiogram was negative.  No reported hematemesis while hospitalized, GI did not see an indication for repeat endoscopy.  Advised to avoid aspirin, NSAIDs.  Oncologist recommended treatment with IV iron.  On day of discharge, hemoglobin stable at 9.7. -Continue pantoprazole 40 mg every day. -Avoid aspirin/NSAID. -Follow-up outpatient with [facility] gastroenterology.

## 2022-11-16 NOTE — ED Triage Notes (Signed)
Patient arrived with EMS from home reports multiple episodes of bright red bloody emesis with clots this evening . Mild pain across abdomen , CBG= 127, initially hypotensive at home 75/40 , received NS 500 ml IV and Zofran 4 mg IV by EMS . BP improved to 122/62 at arrival .

## 2022-11-16 NOTE — ED Notes (Signed)
Patient completed 1st unit PRBC with no adverse effect , IV sites intact , afebrile/respirations unlabored .

## 2022-11-16 NOTE — ED Notes (Signed)
1st unit PRBC infusing at 150 ml/hr ,  no adverse effect , IV site intact/respirations unlabored , afebrile .

## 2022-11-17 DIAGNOSIS — K429 Umbilical hernia without obstruction or gangrene: Secondary | ICD-10-CM | POA: Diagnosis not present

## 2022-11-17 DIAGNOSIS — Z66 Do not resuscitate: Secondary | ICD-10-CM | POA: Diagnosis not present

## 2022-11-17 DIAGNOSIS — M47812 Spondylosis without myelopathy or radiculopathy, cervical region: Secondary | ICD-10-CM | POA: Diagnosis not present

## 2022-11-17 DIAGNOSIS — E876 Hypokalemia: Secondary | ICD-10-CM | POA: Diagnosis not present

## 2022-11-17 DIAGNOSIS — D62 Acute posthemorrhagic anemia: Secondary | ICD-10-CM | POA: Diagnosis not present

## 2022-11-17 DIAGNOSIS — Z841 Family history of disorders of kidney and ureter: Secondary | ICD-10-CM | POA: Diagnosis not present

## 2022-11-17 DIAGNOSIS — Z832 Family history of diseases of the blood and blood-forming organs and certain disorders involving the immune mechanism: Secondary | ICD-10-CM | POA: Diagnosis not present

## 2022-11-17 DIAGNOSIS — Z833 Family history of diabetes mellitus: Secondary | ICD-10-CM | POA: Diagnosis not present

## 2022-11-17 DIAGNOSIS — D649 Anemia, unspecified: Secondary | ICD-10-CM | POA: Diagnosis not present

## 2022-11-17 DIAGNOSIS — K921 Melena: Secondary | ICD-10-CM | POA: Diagnosis not present

## 2022-11-17 DIAGNOSIS — Z5982 Transportation insecurity: Secondary | ICD-10-CM | POA: Diagnosis not present

## 2022-11-17 DIAGNOSIS — Z79899 Other long term (current) drug therapy: Secondary | ICD-10-CM | POA: Diagnosis not present

## 2022-11-17 DIAGNOSIS — K760 Fatty (change of) liver, not elsewhere classified: Secondary | ICD-10-CM | POA: Diagnosis not present

## 2022-11-17 DIAGNOSIS — Z5941 Food insecurity: Secondary | ICD-10-CM | POA: Diagnosis not present

## 2022-11-17 DIAGNOSIS — K8020 Calculus of gallbladder without cholecystitis without obstruction: Secondary | ICD-10-CM | POA: Diagnosis not present

## 2022-11-17 DIAGNOSIS — F419 Anxiety disorder, unspecified: Secondary | ICD-10-CM | POA: Diagnosis not present

## 2022-11-17 DIAGNOSIS — E1159 Type 2 diabetes mellitus with other circulatory complications: Secondary | ICD-10-CM | POA: Diagnosis not present

## 2022-11-17 DIAGNOSIS — I10 Essential (primary) hypertension: Secondary | ICD-10-CM | POA: Diagnosis not present

## 2022-11-17 DIAGNOSIS — Z6839 Body mass index (BMI) 39.0-39.9, adult: Secondary | ICD-10-CM | POA: Diagnosis not present

## 2022-11-17 DIAGNOSIS — G8929 Other chronic pain: Secondary | ICD-10-CM | POA: Diagnosis not present

## 2022-11-17 DIAGNOSIS — Z8711 Personal history of peptic ulcer disease: Secondary | ICD-10-CM | POA: Diagnosis not present

## 2022-11-17 DIAGNOSIS — R140 Abdominal distension (gaseous): Secondary | ICD-10-CM | POA: Diagnosis not present

## 2022-11-17 DIAGNOSIS — F17210 Nicotine dependence, cigarettes, uncomplicated: Secondary | ICD-10-CM | POA: Diagnosis not present

## 2022-11-17 DIAGNOSIS — F32A Depression, unspecified: Secondary | ICD-10-CM | POA: Diagnosis not present

## 2022-11-17 DIAGNOSIS — Z7984 Long term (current) use of oral hypoglycemic drugs: Secondary | ICD-10-CM | POA: Diagnosis not present

## 2022-11-17 DIAGNOSIS — E119 Type 2 diabetes mellitus without complications: Secondary | ICD-10-CM | POA: Diagnosis not present

## 2022-11-17 DIAGNOSIS — K920 Hematemesis: Secondary | ICD-10-CM | POA: Diagnosis not present

## 2022-11-17 DIAGNOSIS — Z560 Unemployment, unspecified: Secondary | ICD-10-CM | POA: Diagnosis not present

## 2022-11-17 DIAGNOSIS — E785 Hyperlipidemia, unspecified: Secondary | ICD-10-CM | POA: Diagnosis not present

## 2022-11-17 DIAGNOSIS — I780 Hereditary hemorrhagic telangiectasia: Secondary | ICD-10-CM | POA: Diagnosis not present

## 2022-11-17 DIAGNOSIS — Z8249 Family history of ischemic heart disease and other diseases of the circulatory system: Secondary | ICD-10-CM | POA: Diagnosis not present

## 2022-11-17 LAB — CBC
HCT: 23 % — ABNORMAL LOW (ref 36.0–46.0)
Hemoglobin: 7 g/dL — ABNORMAL LOW (ref 12.0–15.0)
MCH: 25.3 pg — ABNORMAL LOW (ref 26.0–34.0)
MCHC: 30.4 g/dL (ref 30.0–36.0)
MCV: 83 fL (ref 80.0–100.0)
Platelets: 233 10*3/uL (ref 150–400)
RBC: 2.77 MIL/uL — ABNORMAL LOW (ref 3.87–5.11)
RDW: 22.5 % — ABNORMAL HIGH (ref 11.5–15.5)
WBC: 11.5 10*3/uL — ABNORMAL HIGH (ref 4.0–10.5)
nRBC: 0 % (ref 0.0–0.2)

## 2022-11-17 LAB — BASIC METABOLIC PANEL WITH GFR
Anion gap: 8 (ref 5–15)
BUN: 13 mg/dL (ref 8–23)
CO2: 24 mmol/L (ref 22–32)
Calcium: 8.6 mg/dL — ABNORMAL LOW (ref 8.9–10.3)
Chloride: 108 mmol/L (ref 98–111)
Creatinine, Ser: 0.96 mg/dL (ref 0.44–1.00)
GFR, Estimated: >60 mL/min (ref >60)
Glucose, Bld: 80 mg/dL (ref 70–99)
Potassium: 4 mmol/L (ref 3.5–5.1)
Sodium: 140 mmol/L (ref 135–145)

## 2022-11-17 LAB — HEMOGLOBIN AND HEMATOCRIT, BLOOD
HCT: 32.2 % — ABNORMAL LOW (ref 36.0–46.0)
Hemoglobin: 9.7 g/dL — ABNORMAL LOW (ref 12.0–15.0)

## 2022-11-17 LAB — GLUCOSE, CAPILLARY
Glucose-Capillary: 82 mg/dL (ref 70–99)
Glucose-Capillary: 125 mg/dL — ABNORMAL HIGH (ref 70–99)
Glucose-Capillary: 92 mg/dL (ref 70–99)
Glucose-Capillary: 106 mg/dL — ABNORMAL HIGH (ref 70–99)

## 2022-11-17 LAB — PREPARE RBC (CROSSMATCH)

## 2022-11-17 LAB — FERRITIN: Ferritin: 34 ng/mL (ref 11–307)

## 2022-11-17 MED ORDER — ALPRAZOLAM 0.5 MG PO TABS: 0.5000 mg | ORAL_TABLET | Freq: Once | ORAL | Status: DC | PRN

## 2022-11-17 MED ORDER — SODIUM CHLORIDE 0.9% IV SOLUTION: Freq: Once | INTRAVENOUS | Status: AC

## 2022-11-17 MED ORDER — SODIUM CHLORIDE 0.9% IV SOLUTION
250.0000 mL | Freq: Once | INTRAVENOUS | Status: AC
  Administered 2022-11-17: 250 mL via INTRAVENOUS

## 2022-11-17 MED ORDER — LIDOCAINE 5 % EX PTCH
1.0000 | MEDICATED_PATCH | CUTANEOUS | Status: DC
  Administered 2022-11-17 – 2022-11-18 (×2): 1 via TRANSDERMAL
  Filled 2022-11-17 (×2): qty 1

## 2022-11-17 MED ORDER — DIPHENHYDRAMINE HCL 25 MG PO CAPS
25.0000 mg | ORAL_CAPSULE | Freq: Once | ORAL | Status: AC
  Administered 2022-11-17: 25 mg via ORAL
  Filled 2022-11-17: qty 1

## 2022-11-17 NOTE — Plan of Care (Signed)

## 2022-11-17 NOTE — Progress Notes (Signed)
Subjective:   Summary: Peggy House is a 62 y.o. year old female currently admitted on the IMTS HD#0 for acute blood loss anemia in the setting of HHT.  Overnight Events: Repeat Hb 7.0   Pt seen bedside this AM. She states she is doing well, and denies any episodes of bloody bowel movements or hematemesis. Denies shortness of breath or abdominal pain as well.   Objective:  Vital signs in last 24 hours: Vitals:   11/16/22 2038 11/17/22 0047 11/17/22 0500 11/17/22 0518  BP: (!) 154/95 (!) 141/67  134/67  Pulse: (!) 59 63  64  Resp: 18 18  18   Temp: 98.3 F (36.8 C) 98.7 F (37.1 C)  99.2 F (37.3 C)  TempSrc: Oral Oral  Oral  SpO2: 100% 99%  99%  Weight:   100.8 kg   Height:       Supplemental O2: Room Air SpO2: 99 %   Physical Exam:  Constitutional: well-appearing female, laying in bed, = in no acute distress Cardiovascular: RRR, no murmurs, rubs or gallops Pulmonary/Chest: normal work of breathing on room air, lungs clear to auscultation bilaterally Abdominal: soft, non-tender, non-distended Skin: warm and dry, port in RU chest non-tender Extremities: upper/lower extremity pulses 2+, no lower extremity edema present  Filed Weights   11/16/22 0810 11/17/22 0500  Weight: 101.6 kg 100.8 kg    No intake or output data in the 24 hours ending 11/17/22 0650 Net IO Since Admission: 315 mL [11/17/22 0650]  Pertinent Labs:    Latest Ref Rng & Units 11/17/2022    4:14 AM 11/16/2022    8:03 PM 11/16/2022   12:33 PM  CBC  WBC 4.0 - 10.5 K/uL 11.5     Hemoglobin 12.0 - 15.0 g/dL 7.0  7.5  7.5   Hematocrit 36.0 - 46.0 % 23.0  24.9  24.6   Platelets 150 - 400 K/uL 233          Latest Ref Rng & Units 11/17/2022    4:14 AM 11/16/2022    2:28 AM 10/31/2022   12:57 PM  CMP  Glucose 70 - 99 mg/dL 80  119  147   BUN 8 - 23 mg/dL 13  8  10    Creatinine 0.44 - 1.00 mg/dL 8.29  5.62  1.30   Sodium 135 - 145 mmol/L 140  140  140   Potassium 3.5 - 5.1  mmol/L 4.0  2.8  3.6   Chloride 98 - 111 mmol/L 108  108  108   CO2 22 - 32 mmol/L 24  20  24    Calcium 8.9 - 10.3 mg/dL 8.6  8.5  9.0   Total Protein 6.5 - 8.1 g/dL  6.0  7.0   Total Bilirubin 0.3 - 1.2 mg/dL  0.4  0.3   Alkaline Phos 38 - 126 U/L  64  70   AST 15 - 41 U/L  12  8   ALT 0 - 44 U/L  8  <5       Assessment/Plan:   Principal Problem:   Symptomatic anemia   Patient Summary: Peggy House is a 62 y.o. with a pertinent PMH of HHT, DM2, HTN, HLD, who presented with hematemesis and admitted for anemia and GI bleed.    #Acute on Chronic Symptomatic Anemia  #HHT Pt still remains hemodynamically stable, and denies any symptoms such as melena or hematemesis.  GI evaluated pt yesterday and believe bleed is likely coming from the same place. 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 She has received in total 2U of pRBCs. Most recent hemoglobin is 7.0, will transfuse one more. Discussed with oncology, do not believe she is missing any further workup besides a ferritin, will add on. She is compliant with her HHT medication Avastin, as last dose was two weeks ago.   Plan:  - Transfuse 1U pRBCs (total of 3 this admission)  - Appreciate heme/onc input, will try to discuss with Dr. Mosetta Putt tomorrow  - Would need close follow up in the outpatient setting - Avoid NSAIDs - Check ferritin  #DM2 Continuing sliding scale and metformin  #HTN Continuing amlodipine and lisinopril  #Depression and Anxiety  Restarted Xanax, citalopram, and trazodone  Diet: Normal IVF: None,None VTE: SCDs Code: DNR PT/OT recs: Pending, none.    Dispo: Anticipated discharge to Home in 3 days pending medical management.   Olegario Messier, MD PGY-2 Internal Medicine Resident Pager Number [phone] Please contact the on call pager after 5 pm and on weekends at [phone].

## 2022-11-17 NOTE — Progress Notes (Signed)
Subjective: No hematemesis or melena.  Objective: Vital signs in last 24 hours: Temp:  [97.9 F (36.6 C)-99.2 F (37.3 C)] 98.2 F (36.8 C) (08/04 1214) Pulse Rate:  [59-66] 64 (08/04 1214) Resp:  [16-18] 17 (08/04 1214) BP: (128-154)/(67-95) 128/93 (08/04 1214) SpO2:  [94 %-100 %] 99 % (08/04 1214) Weight:  [100.8 kg] 100.8 kg (08/04 0500) Weight change:  Last BM Date : 11/16/22  PE: GEN:  NAD NEURO:  No encephalopathy  Lab Results: CBC    Component Value Date/Time   WBC 11.5 (H) 11/17/2022 0414   RBC 2.77 (L) 11/17/2022 0414   HGB 7.0 (L) 11/17/2022 0414   HGB 5.9 (LL) 10/31/2022 1257   HGB 8.6 (L) 06/14/2020 1626   HGB 8.1 (L) 02/05/2017 1407   HCT 23.0 (L) 11/17/2022 0414   HCT 28.2 (L) 06/14/2020 1626   HCT 26.6 (L) 02/05/2017 1407   PLT 233 11/17/2022 0414   PLT 411 (H) 10/31/2022 1257   PLT 353 06/14/2020 1626   MCV 83.0 11/17/2022 0414   MCV 89 06/14/2020 1626   MCV 82.8 02/05/2017 1407   MCH 25.3 (L) 11/17/2022 0414   MCHC 30.4 11/17/2022 0414   RDW 22.5 (H) 11/17/2022 0414   RDW 18.9 (H) 06/14/2020 1626   RDW 22.6 (H) 02/05/2017 1407   LYMPHSABS 1.5 11/16/2022 0228   LYMPHSABS 2.5 05/20/2017 1551   LYMPHSABS 2.6 02/05/2017 1407   MONOABS 0.5 11/16/2022 0228   MONOABS 0.5 02/05/2017 1407   EOSABS 0.1 11/16/2022 0228   EOSABS 0.1 05/20/2017 1551   BASOSABS 0.1 11/16/2022 0228   BASOSABS 0.0 05/20/2017 1551   BASOSABS 0.1 02/05/2017 1407  CMP     Component Value Date/Time   NA 140 11/17/2022 0414   NA 147 (H) 04/02/2017 1708   NA 141 02/05/2017 1407   K 4.0 11/17/2022 0414   K 3.5 02/05/2017 1407   CL 108 11/17/2022 0414   CO2 24 11/17/2022 0414   CO2 24 02/05/2017 1407   GLUCOSE 80 11/17/2022 0414   GLUCOSE 134 02/05/2017 1407   BUN 13 11/17/2022 0414   BUN 5 (L) 04/02/2017 1708   BUN 9.3 02/05/2017 1407   CREATININE 0.96 11/17/2022 0414   CREATININE 0.92 10/31/2022 1257   CREATININE 0.9 02/05/2017 1407   CALCIUM 8.6 (L) 11/17/2022  0414   CALCIUM 8.9 02/05/2017 1407   PROT 6.0 (L) 11/16/2022 0228   PROT 6.7 04/02/2017 1708   PROT 7.1 02/05/2017 1407   ALBUMIN 2.7 (L) 11/16/2022 0228   ALBUMIN 3.8 04/02/2017 1708   ALBUMIN 3.5 02/05/2017 1407   AST 12 (L) 11/16/2022 0228   AST 8 (L) 10/31/2022 1257   AST 13 02/05/2017 1407   ALT 8 11/16/2022 0228   ALT <5 10/31/2022 1257   ALT 8 02/05/2017 1407   ALKPHOS 64 11/16/2022 0228   ALKPHOS 84 02/05/2017 1407   BILITOT 0.4 11/16/2022 0228   BILITOT 0.3 10/31/2022 1257   BILITOT 0.23 02/05/2017 1407   EGFR >60 02/05/2017 1407   GFRNONAA >60 11/17/2022 0414   GFRNONAA >60 10/31/2022 1257   Assessment:   Hematemesis, resolved.  CT angiogram negative. Melena, resolved. HHT with multiple prior GI bleeds, multiple prior endoscopies. Acute blood loss anemia.  Plan:   Transfuse. No active bleeding:  Don't see need for another endoscopic procedure. No asa/nsaids. Pantoprazole 40 mg po every day indefinitely. Outpatient follow-up with [facility] (she has missed many appointments). [facility] will sign-off; please call with questions; thank you  for the consultation.   Peggy House 11/17/2022, 1:23 PM   Cell [phone] If no answer or after 5 PM call [phone]

## 2022-11-17 NOTE — Evaluation (Signed)
Physical Therapy Evaluation Patient Details Name: Peggy House MRN: [id number] DOB: 03-16-1961 Today's Date: 11/17/2022  History of Present Illness  Pt is 62 yo female presenting to [facility] after an episode of hematemasis containing bright red blood and blood clots and abdominal pain. She is reporting orthopnea and dyspnea on exertion. Pt was admitted 6/12 for acute on chronic anemia. S/P EGD 6/14. PMH includes: hereditary Hemorrhagic Telangiectasia, blood loss anemia, MDD, GAD, arthritis, HTN, obesity, DM2.  Clinical Impression  Pt is presenting slightly below baseline. Currently pt is CGA for sit to stand and short distance gait. Pt was unable to attempt stairs today due to EKG team needed pt. Pt is demonstrating ability to ascend/descend stairs per home set up with Mod A and use of hand rail which pt states she was getting assistance from her sons prior to hospitalization. Pt will benefit from continued skilled physical therapy services on discharge from acute care hospital setting 3x/weekly in order to work on strength, mobility and balance to decrease risk for falls, injury, immobility and re-hospitalization.        If plan is discharge home, recommend the following: Help with stairs or ramp for entrance;Assistance with cooking/housework;Assist for transportation     Equipment Recommendations BSC/3in1  Recommendations for Other Services  OT consult    Functional Status Assessment Patient has had a recent decline in their functional status and demonstrates the ability to make significant improvements in function in a reasonable and predictable amount of time.     Precautions / Restrictions Precautions Precautions: Fall Restrictions Weight Bearing Restrictions: No Other Position/Activity Restrictions: Pt knees are very very stiff she is waiting on knee surgery      Mobility  Bed Mobility    General bed mobility comments: Unobserved pt sitting in recliner on entry and left in  recliner at end on pt request Patient Response: Cooperative  Transfers Overall transfer level: Needs assistance Equipment used: None Transfers: Sit to/from Stand Sit to Stand: Min guard           General transfer comment: CGA very slow to get fully to standing, very heavy use of bil arm rests    Ambulation/Gait Ambulation/Gait assistance: Min guard Gait Distance (Feet): 30 Feet Assistive device: IV Pole Gait Pattern/deviations: Step-through pattern, Antalgic, Wide base of support   Gait velocity interpretation: <1.31 ft/sec, indicative of household ambulator   General Gait Details: Minimal knee flexion, low foot clearance with slow cadence. Pt states this is how she was walking at home.  Stairs Stairs:  (Not performed today due to EKG team arrived and stated they need pt. Pt states her son helps her with stairs at baseline and cannot do them at Mod I due to knees.)          Wheelchair Mobility     Tilt Bed Tilt Bed Patient Response: Cooperative     Balance Overall balance assessment: Mild deficits observed, not formally tested         Pertinent Vitals/Pain Pain Assessment Pain Assessment: Faces Faces Pain Scale: Hurts even more Pain Location: bil knees with mobility Pain Descriptors / Indicators: Aching Pain Intervention(s): Monitored during session    Home Living Family/patient expects to be discharged to:: Private residence Living Arrangements: Children (lives with son and girlfriend) Available Help at Discharge: Family;Available PRN/intermittently Type of Home: Apartment Home Access: Stairs to enter Entrance Stairs-Rails: Right;Left;Can reach both Entrance Stairs-Number of Steps: 15   Home Layout: One level Home Equipment: Agricultural consultant (2 wheels)  Prior Function Prior Level of Function : Needs assist             Mobility Comments: Intermittently uses RW but usually furniture walks ADLs Comments: Son helps with groceries, meals and  transportation as well as sometimes with donning socks     Hand Dominance   Dominant Hand: Right    Extremity/Trunk Assessment   Upper Extremity Assessment Upper Extremity Assessment: Overall WFL for tasks assessed    Lower Extremity Assessment Lower Extremity Assessment: Overall WFL for tasks assessed    Cervical / Trunk Assessment Cervical / Trunk Assessment: Normal  Communication   Communication: No difficulties  Cognition Arousal/Alertness: Awake/alert Behavior During Therapy: WFL for tasks assessed/performed Overall Cognitive Status: Within Functional Limits for tasks assessed          General Comments General comments (skin integrity, edema, etc.): Pt IV was leaking upon entering room. Nursing came in and adjusted. Pt IV started leaking again at end of session. Pt states she is concerned about house and asked if there were resources. Case management and social worker were contacted via messaging for pt letting them know pt is concerned about house stability.        Assessment/Plan    PT Assessment Patient needs continued PT services  PT Problem List Decreased mobility;Decreased activity tolerance;Decreased strength       PT Treatment Interventions DME instruction;Therapeutic activities;Gait training;Therapeutic exercise;Patient/family education;Stair training;Balance training;Functional mobility training;Manual techniques    PT Goals (Current goals can be found in the Care Plan section)  Acute Rehab PT Goals Patient Stated Goal: To find housing for herself and get knee surgery PT Goal Formulation: With patient Time For Goal Achievement: 12/01/22 Potential to Achieve Goals: Good    Frequency Min 1X/week        AM-PAC PT "6 Clicks" Mobility  Outcome Measure Help needed turning from your back to your side while in a flat bed without using bedrails?: A Little Help needed moving from lying on your back to sitting on the side of a flat bed without using  bedrails?: A Little Help needed moving to and from a bed to a chair (including a wheelchair)?: A Little Help needed standing up from a chair using your arms (e.g., wheelchair or bedside chair)?: A Little Help needed to walk in hospital room?: A Little Help needed climbing 3-5 steps with a railing? : A Lot 6 Click Score: 17    End of Session   Activity Tolerance: Patient tolerated treatment well Patient left: in chair;with call bell/phone within reach Nurse Communication: Mobility status;Other (comment) (IV leaking) PT Visit Diagnosis: Other abnormalities of gait and mobility (R26.89)    Time: 1610-9604 PT Time Calculation (min) (ACUTE ONLY): 17 min   Charges:   PT Evaluation $PT Eval Low Complexity: 1 Low   PT General Charges $$ ACUTE PT VISIT: 1 Visit         Harrel Carina, DPT, CLT  Acute Rehabilitation Services Office: [phone] (Secure chat preferred)   Claudia Desanctis 11/17/2022, 3:05 PM

## 2022-11-18 DIAGNOSIS — I700 Atherosclerosis of aorta: Secondary | ICD-10-CM | POA: Diagnosis present

## 2022-11-18 DIAGNOSIS — K8020 Calculus of gallbladder without cholecystitis without obstruction: Secondary | ICD-10-CM | POA: Diagnosis present

## 2022-11-18 DIAGNOSIS — D62 Acute posthemorrhagic anemia: Secondary | ICD-10-CM

## 2022-11-18 DIAGNOSIS — D649 Anemia, unspecified: Secondary | ICD-10-CM | POA: Diagnosis not present

## 2022-11-18 DIAGNOSIS — Z7984 Long term (current) use of oral hypoglycemic drugs: Secondary | ICD-10-CM

## 2022-11-18 DIAGNOSIS — E1159 Type 2 diabetes mellitus with other circulatory complications: Secondary | ICD-10-CM | POA: Diagnosis not present

## 2022-11-18 DIAGNOSIS — I780 Hereditary hemorrhagic telangiectasia: Secondary | ICD-10-CM | POA: Diagnosis not present

## 2022-11-18 LAB — BPAM RBC
Blood Product Expiration Date: 202408192359
Blood Product Expiration Date: 202408242359
Blood Product Expiration Date: 202408252359
ISSUE DATE / TIME: 202408030352
ISSUE DATE / TIME: 202408041159
ISSUE DATE / TIME: 202408041638
Unit Type and Rh: 600
Unit Type and Rh: 600
Unit Type and Rh: 600

## 2022-11-18 LAB — TYPE AND SCREEN
ABO/RH(D): A NEG
Antibody Screen: NEGATIVE
Unit division: 0
Unit division: 0
Unit division: 0

## 2022-11-18 LAB — GLUCOSE, CAPILLARY
Glucose-Capillary: 109 mg/dL — ABNORMAL HIGH (ref 70–99)
Glucose-Capillary: 178 mg/dL — ABNORMAL HIGH (ref 70–99)
Glucose-Capillary: 131 mg/dL — ABNORMAL HIGH (ref 70–99)

## 2022-11-18 MED ORDER — SODIUM CHLORIDE 0.9 % IV SOLN
125.0000 mg | Freq: Once | INTRAVENOUS | Status: AC
  Administered 2022-11-18: 125 mg via INTRAVENOUS
  Filled 2022-11-18: qty 10

## 2022-11-18 NOTE — Progress Notes (Signed)
Transition of Care [facility]) - Inpatient Brief Assessment   Patient Details  Name: MANDOLYN MCTIGUE MRN: [id number] Date of Birth: 1961/02/15  Transition [facility] CM/SW Contact:    Janae Bridgeman, RN Phone Number: 11/18/2022, 2:33 PM   Clinical Narrative: Patient was seen at the bedside for [facility] needs.  The patient was provided with Medicaid choice regarding DME needs and patient did not have a preference.  I called [employer] to deliver at 3:1 to the patient's hospital room.  Patient uses Medicaid transportation for appointments.  Outpatient PT referral was placed.  Resources provided but patient is aware of Medicaid transportation process.  The patient plans to follow up with medicine clinic in the next 7-10 days.  Patient plans to call family to provided transportation to home today.  Resources provided for smoking cessation and food scarcity.   Transition of Care Asessment: Insurance and Status: (P) Insurance coverage has been reviewed Patient has primary care physician: (P) Yes Home environment has been reviewed: (P) Yes - home Prior level of function:: (P) Independent with family assistance - patient has 3 sons in the area Prior/Current Home Services: (P) No current home services Social Determinants of Health Reivew: (P) SDOH reviewed interventions complete Readmission risk has been reviewed: (P) Yes Transition of care needs: (P) transition of care needs identified, TOC will continue to follow

## 2022-11-18 NOTE — Plan of Care (Signed)
  Problem: Education: Goal: Ability to describe self-care measures that may prevent or decrease complications (Diabetes Survival Skills Education) will improve Outcome: Progressing   Problem: Fluid Volume: Goal: Ability to maintain a balanced intake and output will improve Outcome: Progressing   Problem: Nutritional: Goal: Maintenance of adequate nutrition will improve Outcome: Progressing   Problem: Skin Integrity: Goal: Risk for impaired skin integrity will decrease Outcome: Progressing   Problem: Activity: Goal: Risk for activity intolerance will decrease Outcome: Progressing   Problem: Nutrition: Goal: Adequate nutrition will be maintained Outcome: Progressing   Problem: Elimination: Goal: Will not experience complications related to bowel motility Outcome: Progressing Goal: Will not experience complications related to urinary retention Outcome: Progressing   Problem: Pain Managment: Goal: General experience of comfort will improve Outcome: Progressing   Problem: Safety: Goal: Ability to remain free from injury will improve Outcome: Progressing   Problem: Skin Integrity: Goal: Risk for impaired skin integrity will decrease Outcome: Progressing

## 2022-11-18 NOTE — Progress Notes (Signed)
  Progress Note   Date: 11/18/2022  Patient Name: Peggy House        MRN#: [id number]   Clarification of diagnosis:   morbid obesity with BMI of 39.61 and obesity related conditions of T2DM, HTN, HLD, Osteoarthritis.

## 2022-11-18 NOTE — Plan of Care (Signed)

## 2022-11-18 NOTE — Progress Notes (Signed)
Physical Therapy Treatment Patient Details Name: Peggy House MRN: [id number] DOB: [birth year] Today's Date: 11/18/2022   History of Present Illness Pt is 62 yo female adm 8/3 after an episode of hematemasis containing bright red blood and blood clots and abdominal pain. PMHx: hereditary Hemorrhagic Telangiectasia, blood loss anemia, MDD, GAD, arthritis, HTN, obesity, DM2.    PT Comments  Pt pleasant and willing to mobilize to return home. Pt benefits from use of RW with gait and reports she has one at home with 3 falls and encouraged use of DME at all times. Pt able to progress to stair ambulation and has family 24hrs at home. Pt educated for OOB for meals, up to toilet and progressive ambulation acutely. Will continue to follow.      If plan is discharge home, recommend the following: Help with stairs or ramp for entrance;Assistance with cooking/housework;Assist for transportation   Can travel by private vehicle        Equipment Recommendations  BSC/3in1    Recommendations for Other Services       Precautions / Restrictions Precautions Precautions: Fall     Mobility  Bed Mobility Overal bed mobility: Needs Assistance Bed Mobility: Supine to Sit     Supine to sit: HOB elevated, Supervision     General bed mobility comments: reliant on rail, increased time, HOB 30 degrees, pt with UB assist to move LLE to EOB due to knee pain    Transfers Overall transfer level: Needs assistance   Transfers: Sit to/from Stand Sit to Stand: Min guard           General transfer comment: pt trying to pull up on RW with cues for hand placement and assist to stabilize RW. Pt stood from bed and toilet    Ambulation/Gait Ambulation/Gait assistance: Min guard Gait Distance (Feet): 200 Feet Assistive device: Rolling walker (2 wheels) Gait Pattern/deviations: Step-through pattern, Decreased stride length, Trunk flexed   Gait velocity interpretation: 1.31 - 2.62 ft/sec,  indicative of limited community ambulator   General Gait Details: cues for posture and proximity to RW. Pt walked 15' then 200' with cues for sequence   Stairs Stairs: Yes Stairs assistance: Min guard Stair Management: Step to pattern, Backwards, Sideways, Two rails, One rail Left Number of Stairs: 9 General stair comments: ascent with bil rails and step to pattern, descend with left rail step to   Wheelchair Mobility     Tilt Bed    Modified Rankin (Stroke Patients Only)       Balance Overall balance assessment: Needs assistance Sitting-balance support: No upper extremity supported, Feet supported Sitting balance-Leahy Scale: Fair     Standing balance support: Bilateral upper extremity supported, No upper extremity supported Standing balance-Leahy Scale: Poor Standing balance comment: RW for gait                            Cognition Arousal/Alertness: Awake/alert Behavior During Therapy: WFL for tasks assessed/performed Overall Cognitive Status: Within Functional Limits for tasks assessed                                          Exercises      General Comments        Pertinent Vitals/Pain Pain Assessment Pain Assessment: 0-10 Pain Score: 3  Pain Location: left knee Pain Descriptors / Indicators: Aching Pain Intervention(s): Limited  activity within patient's tolerance, Monitored during session, Repositioned    Home Living                          Prior Function            PT Goals (current goals can now be found in the care plan section) Progress towards PT goals: Progressing toward goals    Frequency    Min 1X/week      PT Plan Current plan remains appropriate    Co-evaluation              AM-PAC PT "6 Clicks" Mobility   Outcome Measure  Help needed turning from your back to your side while in a flat bed without using bedrails?: A Little Help needed moving from lying on your back to sitting  on the side of a flat bed without using bedrails?: A Little Help needed moving to and from a bed to a chair (including a wheelchair)?: A Little Help needed standing up from a chair using your arms (e.g., wheelchair or bedside chair)?: A Little Help needed to walk in hospital room?: A Little Help needed climbing 3-5 steps with a railing? : A Little 6 Click Score: 18    End of Session   Activity Tolerance: Patient tolerated treatment well Patient left: in chair;with call bell/phone within reach Nurse Communication: Mobility status PT Visit Diagnosis: Other abnormalities of gait and mobility (R26.89)     Time: 8295-6213 PT Time Calculation (min) (ACUTE ONLY): 24 min  Charges:    $Gait Training: 8-22 mins $Therapeutic Activity: 8-22 mins PT General Charges $$ ACUTE PT VISIT: 1 Visit                     Merryl Hacker, PT Acute Rehabilitation Services Office: [phone]    Cristine Polio 11/18/2022, 11:37 AM

## 2022-11-18 NOTE — Discharge Instructions (Addendum)
It has been a pleasure taking care of you.  You were admitted due to vomiting blood.  Please avoid aspirin and NSAIDs.  You can take Tylenol instead for pain or fever. -Continue pantoprazole 40 mg every day. -Follow-up with your PCP in clinic. -Follow-up with Dr. Mosetta Putt (Oncologist) in clinic.

## 2022-11-18 NOTE — Discharge Summary (Addendum)
Name: Peggy House MRN: [id number] DOB: 1961/02/15 62 y.o. PCP: Olegario Messier, MD  Date of Admission: 11/16/2022  2:19 AM Date of Discharge: 11/18/2022 Attending Physician: Dr. Cleda Daub  Discharge Diagnosis: Principal Problem:   Symptomatic anemia Active Problems:   Acute on chronic blood loss anemia   Iron deficiency anemia   Type 2 diabetes mellitus with vascular disease (HCC)   HHT (hereditary hemorrhagic telangiectasia) (HCC)    Discharge Medications: Allergies as of 11/18/2022       Reactions   Feraheme [ferumoxytol] Other (See Comments)   Muscle tetany, encephalopathy, fatigue, nausea (reaction to injection)   Nsaids Other (See Comments)   Stomach bleeding episodes; Tylenol is OK   Tomato Hives   Iron (ferrous Sulfate) [ferrous Sulfate Er] Other (See Comments)   Shock   Wasp Venom Swelling        Medication List     STOP taking these medications    Aminocaproic Acid 1000 MG Tabs   lidocaine-prilocaine cream Commonly known as: EMLA   lisinopril 20 MG tablet Commonly known as: ZESTRIL   ondansetron 4 MG tablet Commonly known as: Zofran       TAKE these medications    Accu-Chek Guide test strip Generic drug: glucose blood Check blood sugar 2 times per day while on steroids before breakfast and dinner What changed:  how much to take how to take this when to take this   Accu-Chek Softclix Lancets lancets Use to check blood sugar before breakfast and before dinner while on steroids What changed:  how much to take how to take this when to take this   ALPRAZolam 0.5 MG tablet Commonly known as: XANAX TAKE 1 TABLET BY MOUTH AT BEDTIME AS NEEDED FOR ANXIETY OR SLEEP What changed: See the new instructions.   amLODipine 10 MG tablet Commonly known as: NORVASC TAKE 1 TABLET(10 MG) BY MOUTH DAILY What changed: See the new instructions.   citalopram 20 MG tablet Commonly known as: CeleXA Take 1 tablet (20 mg total) by mouth  daily.   metFORMIN 500 MG tablet Commonly known as: GLUCOPHAGE TAKE 1 TABLET(500 MG) BY MOUTH TWICE DAILY WITH A MEAL What changed:  how much to take how to take this when to take this additional instructions   nicotine 14 mg/24hr patch Commonly known as: NICODERM CQ - dosed in mg/24 hours Place 1 patch (14 mg total) onto the skin daily. What changed: when to take this   pantoprazole 40 MG tablet Commonly known as: PROTONIX TAKE 1 TABLET(40 MG) BY MOUTH TWICE DAILY What changed: See the new instructions.   ProAir HFA 108 (90 Base) MCG/ACT inhaler Generic drug: albuterol INHALE 1 TO 2 PUFFS INTO THE LUNGS EVERY 6 HOURS AS NEEDED FOR WHEEZING OR SHORTNESS OF BREATH What changed: See the new instructions.   traZODone 100 MG tablet Commonly known as: DESYREL TAKE 1 TABLET(100 MG) BY MOUTH AT BEDTIME What changed:  how much to take how to take this when to take this   Tylenol PM Extra Strength 500-25 MG Tabs tablet Generic drug: diphenhydramine-acetaminophen Take 2 tablets by mouth at bedtime as needed (for sleep, knee pain).        Disposition and follow-up:   [name] was discharged from [facility] in Fair condition.  At the hospital follow up visit please address:  1.  Follow-up:  a.  Hemoglobin.  On day of discharge, was stable at 9.7.   2.  Labs / imaging needed  at time of follow-up: CBC and BMP.  Follow-up Appointments: -Resident clinic, August 15th, 2024  Hospital Course by problem list: Peggy House is a 62 y.o. year old female currently admitted on the IMTS HD#0 for acute blood loss anemia in the setting of HHT.   #Acute on Chronic Symptomatic Anemia HHT She presented with 1 episode of significant hematemesis of bright red blood. CT angiogram was negative.  No reported hematemesis while hospitalized, GI did not see an indication for repeat endoscopy.  Advised to avoid aspirin, NSAIDs.  Oncologist recommended  treatment with IV iron.  On day of discharge, hemoglobin stable at 9.7. -Continue pantoprazole 40 mg every day. -Avoid aspirin/NSAID. -Follow-up outpatient with [facility] gastroenterology.   Discharge Subjective: Patient evaluated at bedside this AM.  Discharge Exam:   BP 139/68 (BP Location: Right Arm)   Pulse (!) 59   Temp 98.7 F (37.1 C) (Oral)   Resp 17   Ht 5\' 5"  (1.651 m)   Wt 108 kg   SpO2 96%   BMI 39.61 kg/m  Constitutional: well-appearing, sitting comfortably in bed.   HENT: normocephalic atraumatic, mucous membranes moist Eyes: conjunctiva non-erythematous Neck: supple Cardiovascular: regular rate and rhythm, no m/r/g Pulmonary/Chest: normal work of breathing on room air, lungs clear to auscultation bilaterally Abdominal: soft, non-tender,  Skin: warm and dry Psych: Mood and affect appropriate.  Pertinent Labs, Studies, and Procedures:     Latest Ref Rng & Units 11/17/2022   11:39 PM 11/17/2022    4:14 AM 11/16/2022    8:03 PM  CBC  WBC 4.0 - 10.5 K/uL  11.5    Hemoglobin 12.0 - 15.0 g/dL 9.7  7.0  7.5   Hematocrit 36.0 - 46.0 % 32.2  23.0  24.9   Platelets 150 - 400 K/uL  233         Latest Ref Rng & Units 11/17/2022    4:14 AM 11/16/2022    2:28 AM 10/31/2022   12:57 PM  CMP  Glucose 70 - 99 mg/dL 80  366  440   BUN 8 - 23 mg/dL 13  8  10    Creatinine 0.44 - 1.00 mg/dL 3.47  4.25  9.56   Sodium 135 - 145 mmol/L 140  140  140   Potassium 3.5 - 5.1 mmol/L 4.0  2.8  3.6   Chloride 98 - 111 mmol/L 108  108  108   CO2 22 - 32 mmol/L 24  20  24    Calcium 8.9 - 10.3 mg/dL 8.6  8.5  9.0   Total Protein 6.5 - 8.1 g/dL  6.0  7.0   Total Bilirubin 0.3 - 1.2 mg/dL  0.4  0.3   Alkaline Phos 38 - 126 U/L  64  70   AST 15 - 41 U/L  12  8   ALT 0 - 44 U/L  8  <5     CT ANGIO GI BLEED  Result Date: 11/16/2022 CLINICAL DATA:  Hematemesis, multiple episodes. Mild pain across abdomen. EXAM: CTA ABDOMEN AND PELVIS WITHOUT AND WITH CONTRAST TECHNIQUE: Multidetector CT imaging  of the abdomen and pelvis was performed using the standard protocol during bolus administration of intravenous contrast. Multiplanar reconstructed images and MIPs were obtained and reviewed to evaluate the vascular anatomy. RADIATION DOSE REDUCTION: This exam was performed according to the departmental dose-optimization program which includes automated exposure control, adjustment of the mA and/or kV according to patient size and/or use of iterative reconstruction technique. CONTRAST:  75mL OMNIPAQUE  IOHEXOL 350 MG/ML SOLN COMPARISON:  10/03/2022. FINDINGS: VASCULAR Aorta: Normal caliber aorta without aneurysm, dissection, vasculitis or significant stenosis. Aortic atherosclerosis. Celiac: Patent without evidence of aneurysm, dissection, vasculitis or significant stenosis. SMA: Patent without evidence of aneurysm, dissection, vasculitis or significant stenosis. Renals: Both renal arteries are patent without evidence of aneurysm, dissection, vasculitis, fibromuscular dysplasia or significant stenosis. IMA: Patent without evidence of aneurysm, dissection, vasculitis or significant stenosis. Inflow: Patent without evidence of aneurysm, dissection, vasculitis or significant stenosis. Proximal Outflow: Bilateral common femoral and visualized portions of the superficial and profunda femoral arteries are patent without evidence of aneurysm, dissection, vasculitis or significant stenosis. Veins: No obvious venous abnormality within the limitations of this arterial phase study. Review of the MIP images confirms the above findings. NON-VASCULAR Lower chest: Mild atelectasis or scarring is noted at the lung bases. Hepatobiliary: No focal liver abnormality is seen. Fatty infiltration of the liver is noted. A stone is present in the gallbladder. Pancreas: Unremarkable. No pancreatic ductal dilatation or surrounding inflammatory changes. Spleen: Normal in size without focal abnormality. Adrenals/Urinary Tract: The adrenal glands  are within normal limits. The kidneys enhance symmetrically. There is a cyst in the right kidney. No renal calculus or hydronephrosis. Bladder is unremarkable. Stomach/Bowel: Surgical clips are noted in and surrounding the stomach and the stomach is mildly distended. No contrast extravasation is seen to suggest acute hemorrhage. No free air or pneumatosis. Appendix appears normal. No evidence of bowel wall thickening, distention, or inflammatory changes. Lymphatic: No abdominal or pelvic lymphadenopathy. Reproductive: Status post hysterectomy. No adnexal masses. Other: No abdominopelvic ascites. A small fat containing umbilical hernia is present. Musculoskeletal: Degenerative changes are present in the thoracolumbar spine. No acute osseous abnormality. IMPRESSION: VASCULAR 1. No evidence of active contrast extravasation to suggest active hemorrhage. 2. Aortic atherosclerosis. NON-VASCULAR 1. Mildly distended stomach with debris. 2. Hepatic steatosis. 3. Cholelithiasis. Electronically Signed   By: Thornell Sartorius M.D.   On: 11/16/2022 03:46     Discharge Instructions:   Signed: Laretta Bolster, MD 11/18/2022, 10:56 AM   Pager: [phone]

## 2022-11-20 ENCOUNTER — Telehealth: Payer: Self-pay | Admitting: Hematology

## 2022-11-20 ENCOUNTER — Other Ambulatory Visit: Payer: Self-pay | Admitting: Hematology

## 2022-11-20 DIAGNOSIS — I780 Hereditary hemorrhagic telangiectasia: Secondary | ICD-10-CM

## 2022-11-25 ENCOUNTER — Inpatient Hospital Stay: Payer: Medicaid Other

## 2022-11-25 ENCOUNTER — Ambulatory Visit: Payer: Medicaid Other

## 2022-11-25 ENCOUNTER — Other Ambulatory Visit: Payer: Medicaid Other

## 2022-11-28 ENCOUNTER — Ambulatory Visit: Payer: Medicaid Other

## 2022-11-28 ENCOUNTER — Other Ambulatory Visit: Payer: Medicaid Other

## 2022-11-28 ENCOUNTER — Encounter: Payer: Medicaid Other | Admitting: Student

## 2022-11-28 ENCOUNTER — Telehealth: Payer: Self-pay | Admitting: Hematology

## 2022-12-02 DIAGNOSIS — M1712 Unilateral primary osteoarthritis, left knee: Secondary | ICD-10-CM | POA: Diagnosis not present

## 2022-12-02 DIAGNOSIS — M47816 Spondylosis without myelopathy or radiculopathy, lumbar region: Secondary | ICD-10-CM | POA: Diagnosis not present

## 2022-12-05 ENCOUNTER — Other Ambulatory Visit: Payer: Self-pay | Admitting: Hematology

## 2022-12-05 DIAGNOSIS — F331 Major depressive disorder, recurrent, moderate: Secondary | ICD-10-CM

## 2022-12-07 ENCOUNTER — Other Ambulatory Visit: Payer: Self-pay | Admitting: Hematology

## 2022-12-07 ENCOUNTER — Encounter: Payer: Self-pay | Admitting: Hematology

## 2022-12-11 ENCOUNTER — Telehealth: Payer: Self-pay | Admitting: Hematology

## 2022-12-12 ENCOUNTER — Ambulatory Visit: Payer: Medicaid Other

## 2022-12-12 ENCOUNTER — Telehealth: Payer: Self-pay | Admitting: Hematology

## 2022-12-12 ENCOUNTER — Inpatient Hospital Stay: Payer: Medicaid Other

## 2022-12-12 ENCOUNTER — Inpatient Hospital Stay (HOSPITAL_BASED_OUTPATIENT_CLINIC_OR_DEPARTMENT_OTHER): Payer: Medicaid Other | Admitting: Nurse Practitioner

## 2022-12-12 ENCOUNTER — Other Ambulatory Visit: Payer: Medicaid Other

## 2022-12-12 ENCOUNTER — Other Ambulatory Visit: Payer: Self-pay

## 2022-12-12 VITALS — BP 154/69

## 2022-12-12 VITALS — BP 137/69 | HR 61 | Temp 98.5°F | Resp 16 | Wt 223.2 lb

## 2022-12-12 DIAGNOSIS — K922 Gastrointestinal hemorrhage, unspecified: Secondary | ICD-10-CM | POA: Diagnosis not present

## 2022-12-12 DIAGNOSIS — I780 Hereditary hemorrhagic telangiectasia: Secondary | ICD-10-CM

## 2022-12-12 DIAGNOSIS — D500 Iron deficiency anemia secondary to blood loss (chronic): Secondary | ICD-10-CM | POA: Diagnosis present

## 2022-12-12 DIAGNOSIS — D649 Anemia, unspecified: Secondary | ICD-10-CM | POA: Diagnosis not present

## 2022-12-12 DIAGNOSIS — Z95828 Presence of other vascular implants and grafts: Secondary | ICD-10-CM

## 2022-12-12 LAB — CMP (CANCER CENTER ONLY)
ALT: <5 U/L (ref 0–44)
AST: 7 U/L — ABNORMAL LOW (ref 15–41)
Albumin: 4 g/dL (ref 3.5–5.0)
Alkaline Phosphatase: 72 U/L (ref 38–126)
Anion gap: 8 (ref 5–15)
BUN: 14 mg/dL (ref 8–23)
CO2: 26 mmol/L (ref 22–32)
Calcium: 9 mg/dL (ref 8.9–10.3)
Chloride: 106 mmol/L (ref 98–111)
Creatinine: 1.02 mg/dL — ABNORMAL HIGH (ref 0.44–1.00)
GFR, Estimated: >60 mL/min (ref >60)
Glucose, Bld: 102 mg/dL — ABNORMAL HIGH (ref 70–99)
Potassium: 4 mmol/L (ref 3.5–5.1)
Sodium: 140 mmol/L (ref 135–145)
Total Bilirubin: 0.4 mg/dL (ref 0.3–1.2)
Total Protein: 7.3 g/dL (ref 6.5–8.1)

## 2022-12-12 LAB — CBC WITH DIFFERENTIAL (CANCER CENTER ONLY)
Abs Immature Granulocytes: 0.08 10*3/uL — ABNORMAL HIGH (ref 0.00–0.07)
Basophils Absolute: 0.1 10*3/uL (ref 0.0–0.1)
Basophils Relative: 0 %
Eosinophils Absolute: 0.1 10*3/uL (ref 0.0–0.5)
Eosinophils Relative: 1 %
HCT: 25 % — ABNORMAL LOW (ref 36.0–46.0)
Hemoglobin: 7.7 g/dL — ABNORMAL LOW (ref 12.0–15.0)
Immature Granulocytes: 1 %
Lymphocytes Relative: 12 %
Lymphs Abs: 1.6 10*3/uL (ref 0.7–4.0)
MCH: 24.7 pg — ABNORMAL LOW (ref 26.0–34.0)
MCHC: 30.8 g/dL (ref 30.0–36.0)
MCV: 80.1 fL (ref 80.0–100.0)
Monocytes Absolute: 0.7 10*3/uL (ref 0.1–1.0)
Monocytes Relative: 5 %
Neutro Abs: 10.9 10*3/uL — ABNORMAL HIGH (ref 1.7–7.7)
Neutrophils Relative %: 81 %
Platelet Count: 267 10*3/uL (ref 150–400)
RBC: 3.12 MIL/uL — ABNORMAL LOW (ref 3.87–5.11)
RDW: 23 % — ABNORMAL HIGH (ref 11.5–15.5)
WBC Count: 13.5 10*3/uL — ABNORMAL HIGH (ref 4.0–10.5)
nRBC: 0 % (ref 0.0–0.2)

## 2022-12-12 LAB — SAMPLE TO BLOOD BANK

## 2022-12-12 LAB — TOTAL PROTEIN, URINE DIPSTICK: Protein, ur: 100 mg/dL — AB

## 2022-12-12 LAB — FERRITIN: Ferritin: 7 ng/mL — ABNORMAL LOW (ref 11–307)

## 2022-12-12 MED ORDER — SODIUM CHLORIDE 0.9% FLUSH
10.0000 mL | INTRAVENOUS | Status: DC | PRN
  Administered 2022-12-12: 10 mL

## 2022-12-12 MED ORDER — SODIUM CHLORIDE 0.9% FLUSH
10.0000 mL | Freq: Once | INTRAVENOUS | Status: AC
  Administered 2022-12-12: 10 mL

## 2022-12-12 MED ORDER — SODIUM CHLORIDE 0.9 % IV SOLN: Freq: Once | INTRAVENOUS | Status: AC

## 2022-12-12 MED ORDER — HEPARIN SOD (PORK) LOCK FLUSH 100 UNIT/ML IV SOLN
500.0000 [IU] | Freq: Once | INTRAVENOUS | Status: AC | PRN
  Administered 2022-12-12: 500 [IU]

## 2022-12-12 MED ORDER — SODIUM CHLORIDE 0.9 % IV SOLN
5.0000 mg/kg | Freq: Once | INTRAVENOUS | Status: AC
  Administered 2022-12-12: 500 mg via INTRAVENOUS
  Filled 2022-12-12: qty 4

## 2022-12-12 NOTE — Assessment & Plan Note (Addendum)
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 -she has required multiple blood transfusions and APCs since 2019. 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 -She had multiple prolonged hospital stay due to severe GI bleeding.   -s/p IR embolization of gastric artery branch vessels on 12/10/17, cauterization of stomach for severe GI bleed in 11/2018, and epinephrine injection for bleeding ulcer on 11/03/20. -She has required frequent blood transfusion, IV iron and bevacizumab -She does respond to treatment, anemia improves with IV iron and bevacizumab, but she is not very compliant with her appointments. -She was recently hospitalized for recurrent acute on chronic anemia in the setting of HHT. Hemoglobin responded well to 2 units of PRBC transfusion. GI was consulted and advised against further endoscopic procedures. She continues to  follow-up with GI as an outpatient. She will continue with IV iron given in the setting of low ferritin.

## 2022-12-12 NOTE — Progress Notes (Signed)
Per Vincent Gros, NP, Ok to treat with Hgb 7.7.  Patient will be scheduled for blood transfusion.

## 2022-12-12 NOTE — Patient Instructions (Signed)
[facility]  Discharge Instructions: Thank you for choosing [facility] to provide your oncology and hematology care.   If you have a lab appointment with the [facility], please go directly to the [facility] and check in at the registration area.   Wear comfortable clothing and clothing appropriate for easy access to any Portacath or PICC line.   We strive to give you quality time with your provider. You may need to reschedule your appointment if you arrive late (15 or more minutes).  Arriving late affects you and other patients whose appointments are after yours.  Also, if you miss three or more appointments without notifying the office, you may be dismissed from the clinic at the provider's discretion.      For prescription refill requests, have your pharmacy contact our office and allow 72 hours for refills to be completed.    Today you received the following chemotherapy and/or immunotherapy agents: Bevacizumab      To help prevent nausea and vomiting after your treatment, we encourage you to take your nausea medication as directed.  BELOW ARE SYMPTOMS THAT SHOULD BE REPORTED IMMEDIATELY: *FEVER GREATER THAN 100.4 F (38 C) OR HIGHER *CHILLS OR SWEATING *NAUSEA AND VOMITING THAT IS NOT CONTROLLED WITH YOUR NAUSEA MEDICATION *UNUSUAL SHORTNESS OF BREATH *UNUSUAL BRUISING OR BLEEDING *URINARY PROBLEMS (pain or burning when urinating, or frequent urination) *BOWEL PROBLEMS (unusual diarrhea, constipation, pain near the anus) TENDERNESS IN MOUTH AND THROAT WITH OR WITHOUT PRESENCE OF ULCERS (sore throat, sores in mouth, or a toothache) UNUSUAL RASH, SWELLING OR PAIN  UNUSUAL VAGINAL DISCHARGE OR ITCHING   Items with * indicate a potential emergency and should be followed up as soon as possible or go to the Emergency Department if any problems should occur.  Please show the CHEMOTHERAPY ALERT CARD or IMMUNOTHERAPY ALERT CARD at  check-in to the Emergency Department and triage nurse.  Should you have questions after your visit or need to cancel or reschedule your appointment, please contact [facility]  Dept: [phone]  and follow the prompts.  Office hours are 8:00 a.m. to 4:30 p.m. Monday - Friday. Please note that voicemails left after 4:00 p.m. may not be returned until the following business day.  We are closed weekends and major holidays. You have access to a nurse at all times for urgent questions. Please call the main number to the clinic Dept: [phone] and follow the prompts.   For any non-urgent questions, you may also contact your provider using MyChart. We now offer e-Visits for anyone 82 and older to request care online for non-urgent symptoms. For details visit [email or web address].   Also download the MyChart app! Go to the app store, search "MyChart", open the app, select [facility], and log in with your MyChart username and password.

## 2022-12-12 NOTE — Progress Notes (Addendum)
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 -she has required multiple blood transfusions and APCs since 2019. 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 -She had multiple prolonged hospital stay due to severe GI bleeding.   -s/p IR embolization of gastric artery branch vessels on 12/10/17, cauterization of stomach for severe GI bleed in 11/2018, and epinephrine injection for bleeding ulcer on 11/03/20. -She has required frequent blood transfusion, IV iron and bevacizumab -She does respond to treatment, anemia improves with IV iron and bevacizumab, but she is not very compliant with her appointments. -She was recently hospitalized for recurrent acute on chronic anemia in the setting of HHT. Hemoglobin responded well to 2 units of PRBC transfusion. GI was consulted and advised against further endoscopic procedures. She continues to  follow-up with GI as an outpatient. She will continue with IV iron given in the setting of low ferritin.   Plan: Briefly review recent hospitalization  Labs reviewed  -CBC showing WBC 13.5; Hgb 7.7; Hct 25.0; Plt 267; Anc 10.9 -CMP - pending Administer today's dose bevacizumab. Reviewed importance of getting prescribed treatment as scheduled and the risk of hospitalization or other adverse events if missing multiple treatments. She did verbalize understanding.  Bevacizumab treatments every 2 weeks  Patient will receive dose IV iron. She should get weekly IV iron.  Administer 1 unit of PRBCs on Saturday 12/14/2022 due to Hgb of 7.7 today.  Repeat labs with follow up in 2 months.     The total time spent in the appointment was 30 minutes encounter with  patients including review of chart and various tests results, discussions about plan of care and coordination of care plan   All questions were answered. The patient knows to call the clinic with any problems, questions or concerns. No barriers to learning was detected.    Carlean Jews, NP 8/29/20243:03 PM  INTERVAL HISTORY: PARMA PICAZO 62 y.o. female returns for HHT and anemia.  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 -she has required multiple blood transfusions and APCs since 2019. 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 -She had multiple prolonged hospital stay due to severe GI bleeding.   -s/p IR embolization of gastric artery branch vessels on 12/10/17, cauterization of stomach for severe GI bleed in 11/2018, and epinephrine injection for bleeding ulcer on 11/03/20. -She has required frequent blood transfusion, IV iron and bevacizumab -She does respond to treatment, anemia improves with IV iron and bevacizumab, but she is not very compliant with her appointments. -She was recently hospitalized for recurrent acute on chronic anemia in the setting of HHT. Hemoglobin responded well to 2 units of PRBC transfusion.  I have reviewed the past medical history, past surgical history, social history and family history with the patient and they are unchanged from previous note.  ALLERGIES:  is allergic to feraheme [ferumoxytol], nsaids, tomato, iron (ferrous sulfate) [ferrous sulfate er], and wasp venom.  MEDICATIONS:  Current Outpatient Medications  Medication Sig  Dispense Refill   Accu-Chek Softclix Lancets lancets Use to check blood sugar before breakfast and before dinner while on steroids (Patient taking  differently: 1 each by Other route See admin instructions. Use to check blood sugar before breakfast and before dinner while on steroids) 100 each 1   ALPRAZolam (XANAX) 0.5 MG tablet TAKE 1 TABLET BY MOUTH AT BEDTIME AS NEEDED FOR ANXIETY OR SLEEP 7 tablet 0   amLODipine (NORVASC) 10 MG tablet TAKE 1 TABLET(10 MG) BY MOUTH DAILY (Patient taking differently: Take 10 mg by mouth daily.) 90 tablet 3   citalopram (CELEXA) 20 MG tablet Take 1 tablet (20 mg total) by mouth daily. 90 tablet 3   glucose blood (ACCU-CHEK GUIDE) test strip Check blood sugar 2 times per day while on steroids before breakfast and dinner (Patient taking differently: 1 each by Other route See admin instructions. Check blood sugar 2 times per day while on steroids before breakfast and dinner) 50 each 3   metFORMIN (GLUCOPHAGE) 500 MG tablet TAKE 1 TABLET(500 MG) BY MOUTH TWICE DAILY WITH A MEAL (Patient taking differently: Take 500 mg by mouth 2 (two) times daily with a meal.) 180 tablet 3   nicotine (NICODERM CQ - DOSED IN MG/24 HOURS) 14 mg/24hr patch Place 1 patch (14 mg total) onto the skin daily. (Patient taking differently: Place 14 mg onto the skin daily.) 30 patch 3   pantoprazole (PROTONIX) 40 MG tablet TAKE 1 TABLET(40 MG) BY MOUTH TWICE DAILY (Patient taking differently: Take 40 mg by mouth daily.) 180 tablet 1   PROAIR HFA 108 (90 Base) MCG/ACT inhaler INHALE 1 TO 2 PUFFS INTO THE LUNGS EVERY 6 HOURS AS NEEDED FOR WHEEZING OR SHORTNESS OF BREATH (Patient taking differently: Inhale 1-2 puffs into the lungs every 6 (six) hours as needed for wheezing or shortness of breath.) 8.5 g 0   traZODone (DESYREL) 100 MG tablet TAKE 1 TABLET BY MOUTH AT BEDTIME 7 tablet 0   TYLENOL PM EXTRA STRENGTH 500-25 MG TABS tablet Take 2 tablets by mouth at bedtime as needed (for sleep, knee pain).     No current facility-administered medications for this visit.   Facility-Administered Medications Ordered in Other Visits  Medication Dose  Route Frequency Provider Last Rate Last Admin   diphenhydrAMINE (BENADRYL) 25 mg capsule            diphenhydrAMINE (BENADRYL) 25 mg capsule            phenylephrine (NEO-SYNEPHRINE) 1 % nasal drops 2 drop  2 drop Left Nare Once Malachy Mood, MD         REVIEW OF SYSTEMS:   Constitutional: Denies fevers or chills. Admits to fatigue and recent night sweats  Eyes: Denies blurriness of vision Ears, nose, mouth, throat, and face: Denies mucositis or sore throat. States that she did have a nose bleed yesterday  Respiratory: Denies cough, dyspnea or wheezes Cardiovascular: Denies palpitation, chest discomfort or lower extremity swelling Gastrointestinal:  Denies nausea, heartburn or change in bowel habits Skin: Denies abnormal skin rashes Lymphatics: Denies new lymphadenopathy or easy bruising Neurological:Denies numbness, tingling or new weaknesses Behavioral/Psych: Mood is stable, no new changes  All other systems were reviewed with the patient and are negative.  PHYSICAL EXAMINATION: ECOG PERFORMANCE STATUS: 2 - Symptomatic, <50% confined to bed  Vitals:   12/12/22 1338  BP: 137/69  Pulse: 61  Resp: 16  Temp: 98.5 F (36.9 C)  SpO2: 100%   Filed Weights   12/12/22 1338  Weight:  223 lb 3.2 oz (101.2 kg)    GENERAL:alert, no distress and comfortable SKIN: skin color, texture, turgor are normal, no rashes or significant lesions EYES: normal, Conjunctiva are pink and non-injected, sclera clear OROPHARYNX:no exudate, no erythema and lips, buccal mucosa, and tongue normal  NECK: supple, thyroid normal size, non-tender, without nodularity LYMPH:  no palpable lymphadenopathy in the cervical, axillary or inguinal LUNGS: clear to auscultation and percussion with normal breathing effort HEART: regular rate & rhythm. Soft systolic murmur noted. She has no lower extremity edema ABDOMEN:abdomen soft, non-tender and normal bowel sounds Musculoskeletal:no cyanosis of digits and no clubbing   NEURO: alert & oriented x 3 with fluent speech, no focal motor/sensory deficits  LABORATORY DATA:  I have reviewed the data as listed     Component Value Date/Time   NA 140 12/12/2022 1330   NA 147 (H) 04/02/2017 1708   NA 141 02/05/2017 1407   K 4.0 12/12/2022 1330   K 3.5 02/05/2017 1407   CL 106 12/12/2022 1330   CO2 26 12/12/2022 1330   CO2 24 02/05/2017 1407   GLUCOSE 102 (H) 12/12/2022 1330   GLUCOSE 134 02/05/2017 1407   BUN 14 12/12/2022 1330   BUN 5 (L) 04/02/2017 1708   BUN 9.3 02/05/2017 1407   CREATININE 1.02 (H) 12/12/2022 1330   CREATININE 0.9 02/05/2017 1407   CALCIUM 9.0 12/12/2022 1330   CALCIUM 8.9 02/05/2017 1407   PROT 7.3 12/12/2022 1330   PROT 6.7 04/02/2017 1708   PROT 7.1 02/05/2017 1407   ALBUMIN 4.0 12/12/2022 1330   ALBUMIN 3.8 04/02/2017 1708   ALBUMIN 3.5 02/05/2017 1407   AST 7 (L) 12/12/2022 1330   AST 13 02/05/2017 1407   ALT <5 12/12/2022 1330   ALT 8 02/05/2017 1407   ALKPHOS 72 12/12/2022 1330   ALKPHOS 84 02/05/2017 1407   BILITOT 0.4 12/12/2022 1330   BILITOT 0.23 02/05/2017 1407   GFRNONAA >60 12/12/2022 1330   GFRAA >60 01/06/2020 0921    Lab Results  Component Value Date   WBC 13.5 (H) 12/12/2022   NEUTROABS 10.9 (H) 12/12/2022   HGB 7.7 (L) 12/12/2022   HCT 25.0 (L) 12/12/2022   MCV 80.1 12/12/2022   PLT 267 12/12/2022   Addendum I have seen the patient, examined her. I agree with the assessment and and plan and have edited the notes.   Temera has had frequent no-shows, last of bevacizumab was 2 months ago.  She was recently hospitalized for severe GI bleeding and anemia.  I again encouraged her to follow-up with [place] closely, I will schedule weekly lab, IV iron, and bevacizumab every 2 weeks.  I reinforced the importance of compliance, to avoid ED visit and hospitalization.  She voiced good understanding, and agrees.  Lab reviewed, hemoglobin 7.7, ferritin 7.  Will proceed to bevacizumab today, she will return tomorrow  for IV iron.  Ideally I would like to give her wall 2 units of blood, but unfortunately our infusion is not able to accommodate her, we will try to find infusion slot for her next week.  For follow-up of her every 3-week months.  I spent a total of 25 minutes for her visit today, more than 50% time on face-to-face counseling.  Malachy Mood MD 12/12/2022

## 2022-12-13 ENCOUNTER — Inpatient Hospital Stay: Payer: Medicaid Other

## 2022-12-13 VITALS — BP 148/68 | HR 60 | Temp 97.8°F | Resp 16

## 2022-12-13 DIAGNOSIS — Z95828 Presence of other vascular implants and grafts: Secondary | ICD-10-CM

## 2022-12-13 DIAGNOSIS — D500 Iron deficiency anemia secondary to blood loss (chronic): Secondary | ICD-10-CM

## 2022-12-13 DIAGNOSIS — I780 Hereditary hemorrhagic telangiectasia: Secondary | ICD-10-CM | POA: Diagnosis not present

## 2022-12-13 MED ORDER — SODIUM CHLORIDE 0.9 % IV SOLN
250.0000 mg | Freq: Once | INTRAVENOUS | Status: AC
  Administered 2022-12-13: 250 mg via INTRAVENOUS
  Filled 2022-12-13: qty 20

## 2022-12-13 MED ORDER — SODIUM CHLORIDE 0.9% FLUSH
10.0000 mL | Freq: Once | INTRAVENOUS | Status: AC
  Administered 2022-12-13: 10 mL

## 2022-12-13 MED ORDER — HEPARIN SOD (PORK) LOCK FLUSH 100 UNIT/ML IV SOLN
500.0000 [IU] | Freq: Once | INTRAVENOUS | Status: AC
  Administered 2022-12-13: 500 [IU]

## 2022-12-13 NOTE — Patient Instructions (Signed)
Sodium Ferric Gluconate Complex Injection What is this medication? SODIUM FERRIC GLUCONATE COMPLEX (SOE dee um FER ik GLOO koe nate KOM pleks) treats low levels of iron (iron deficiency anemia) in people with kidney disease. Iron is a mineral that plays an important role in making red blood cells, which carry oxygen from your lungs to the rest of your body. This medicine may be used for other purposes; ask your health care provider or pharmacist if you have questions. COMMON BRAND NAME(S): Ferrlecit, Nulecit What should I tell my care team before I take this medication? They need to know if you have any of the following conditions: Anemia that is not from iron deficiency High levels of iron in the blood An unusual or allergic reaction to iron, other medications, foods, dyes, or preservatives Pregnant or are trying to become pregnant Breast-feeding How should I use this medication? This medication is injected into a vein. It is given by your care team in a hospital or clinic setting. Talk to your care team about the use of this medication in children. While it may be prescribed for children as young as 6 years for selected conditions, precautions do apply. Overdosage: If you think you have taken too much of this medicine contact a poison control center or emergency room at once. NOTE: This medicine is only for you. Do not share this medicine with others. What if I miss a dose? It is important not to miss your dose. Call your care team if you are unable to keep an appointment. What may interact with this medication? Do not take this medication with any of the following: Deferasirox Deferoxamine Dimercaprol This medication may also interact with the following: Other iron products This list may not describe all possible interactions. Give your health care provider a list of all the medicines, herbs, non-prescription drugs, or dietary supplements you use. Also tell them if you smoke, drink  alcohol, or use illegal drugs. Some items may interact with your medicine. What should I watch for while using this medication? Your condition will be monitored carefully while you are receiving this medication. Visit your care team for regular checks on your progress. You may need blood work while you are taking this medication. What side effects may I notice from receiving this medication? Side effects that you should report to your care team as soon as possible: Allergic reactions--skin rash, itching, hives, swelling of the face, lips, tongue, or throat Low blood pressure--dizziness, feeling faint or lightheaded, blurry vision Shortness of breath Side effects that usually do not require medical attention (report to your care team if they continue or are bothersome): Flushing Headache Joint pain Muscle pain Nausea Pain, redness, or irritation at injection site This list may not describe all possible side effects. Call your doctor for medical advice about side effects. You may report side effects to FDA at 1-800-FDA-1088. Where should I keep my medication? This medication is given in a hospital or clinic and will not be stored at home. NOTE: This sheet is a summary. It may not cover all possible information. If you have questions about this medicine, talk to your doctor, pharmacist, or health care provider.  2024 Elsevier/Gold Standard (2020-08-25 00:00:00)

## 2022-12-13 NOTE — Progress Notes (Signed)
Patient declined to stay for 30 minute post iron obs. Vitals stable and patient in no distress upon leaving [facility].

## 2022-12-19 ENCOUNTER — Telehealth: Payer: Self-pay | Admitting: *Deleted

## 2022-12-19 DIAGNOSIS — M1712 Unilateral primary osteoarthritis, left knee: Secondary | ICD-10-CM | POA: Diagnosis not present

## 2022-12-19 NOTE — Patient Outreach (Signed)
  Care Management   Note  12/19/2022 Name: Peggy House MRN: [id number] DOB: 04-23-1960  [name] is enrolled in a Managed Medicaid plan: Yes. Outreach attempt today was unsuccessful.   A HIPPA compliant phone message was left for the patient providing contact information and requesting a return call. The care management team will attempt to reach out again over the next 7 days.  Estanislado Emms RN, BSN [place]  [facility] Health RN Care Coordinator [phone]

## 2022-12-20 ENCOUNTER — Telehealth: Payer: Self-pay | Admitting: *Deleted

## 2022-12-20 NOTE — Patient Outreach (Signed)
  Care Management   Note  12/20/2022 Name: Peggy House MRN: [id number] DOB: 01-26-1961  [name] is enrolled in a Managed Medicaid plan: Yes. Outreach attempt today was successful.   The patient was given information about care management services as a benefit of their Medicaid health plan today.    Patient                                              agreed to services and verbal consent obtained.    An initial telephone outreach has been scheduled for: 12/30/22 at 9am  Estanislado Emms RN, BSN [place]  [facility] Health RN Care Coordinator [phone]

## 2022-12-25 ENCOUNTER — Telehealth: Payer: Self-pay | Admitting: Hematology

## 2022-12-25 NOTE — Telephone Encounter (Signed)
per 9/11 secure chat. Patient scheduled for earlier time to be able to receive IV iron. Patient aware of new appointment time.

## 2022-12-26 ENCOUNTER — Inpatient Hospital Stay: Payer: Medicaid Other

## 2022-12-26 ENCOUNTER — Other Ambulatory Visit: Payer: Self-pay

## 2022-12-26 ENCOUNTER — Inpatient Hospital Stay: Payer: Medicaid Other | Attending: Hematology

## 2022-12-26 VITALS — BP 164/74 | HR 72 | Temp 98.0°F | Resp 16

## 2022-12-26 DIAGNOSIS — D649 Anemia, unspecified: Secondary | ICD-10-CM

## 2022-12-26 DIAGNOSIS — I780 Hereditary hemorrhagic telangiectasia: Secondary | ICD-10-CM | POA: Diagnosis present

## 2022-12-26 DIAGNOSIS — Z95828 Presence of other vascular implants and grafts: Secondary | ICD-10-CM

## 2022-12-26 DIAGNOSIS — D500 Iron deficiency anemia secondary to blood loss (chronic): Secondary | ICD-10-CM

## 2022-12-26 LAB — CBC WITH DIFFERENTIAL (CANCER CENTER ONLY)
Abs Immature Granulocytes: 0.03 10*3/uL (ref 0.00–0.07)
Basophils Absolute: 0 10*3/uL (ref 0.0–0.1)
Basophils Relative: 0 %
Eosinophils Absolute: 0.2 10*3/uL (ref 0.0–0.5)
Eosinophils Relative: 2 %
HCT: 23.3 % — ABNORMAL LOW (ref 36.0–46.0)
Hemoglobin: 7 g/dL — ABNORMAL LOW (ref 12.0–15.0)
Immature Granulocytes: 0 %
Lymphocytes Relative: 12 %
Lymphs Abs: 1.1 10*3/uL (ref 0.7–4.0)
MCH: 24.6 pg — ABNORMAL LOW (ref 26.0–34.0)
MCHC: 30 g/dL (ref 30.0–36.0)
MCV: 82 fL (ref 80.0–100.0)
Monocytes Absolute: 0.5 10*3/uL (ref 0.1–1.0)
Monocytes Relative: 5 %
Neutro Abs: 7.6 10*3/uL (ref 1.7–7.7)
Neutrophils Relative %: 81 %
Platelet Count: 429 10*3/uL — ABNORMAL HIGH (ref 150–400)
RBC: 2.84 MIL/uL — ABNORMAL LOW (ref 3.87–5.11)
RDW: 24.9 % — ABNORMAL HIGH (ref 11.5–15.5)
WBC Count: 9.5 10*3/uL (ref 4.0–10.5)
nRBC: 0 % (ref 0.0–0.2)

## 2022-12-26 LAB — PREPARE RBC (CROSSMATCH)

## 2022-12-26 LAB — CMP (CANCER CENTER ONLY)
ALT: 5 U/L (ref 0–44)
AST: 11 U/L — ABNORMAL LOW (ref 15–41)
Albumin: 3.8 g/dL (ref 3.5–5.0)
Alkaline Phosphatase: 72 U/L (ref 38–126)
Anion gap: 5 (ref 5–15)
BUN: 11 mg/dL (ref 8–23)
CO2: 28 mmol/L (ref 22–32)
Calcium: 8.9 mg/dL (ref 8.9–10.3)
Chloride: 107 mmol/L (ref 98–111)
Creatinine: 0.83 mg/dL (ref 0.44–1.00)
GFR, Estimated: >60 mL/min (ref >60)
Glucose, Bld: 98 mg/dL (ref 70–99)
Potassium: 3.9 mmol/L (ref 3.5–5.1)
Sodium: 140 mmol/L (ref 135–145)
Total Bilirubin: 0.3 mg/dL (ref 0.3–1.2)
Total Protein: 6.8 g/dL (ref 6.5–8.1)

## 2022-12-26 LAB — TOTAL PROTEIN, URINE DIPSTICK: Protein, ur: 100 mg/dL — AB

## 2022-12-26 LAB — SAMPLE TO BLOOD BANK

## 2022-12-26 LAB — FERRITIN: Ferritin: 21 ng/mL (ref 11–307)

## 2022-12-26 MED ORDER — SODIUM CHLORIDE 0.9 % IV SOLN: Freq: Once | INTRAVENOUS | Status: AC

## 2022-12-26 MED ORDER — SODIUM CHLORIDE 0.9 % IV SOLN
250.0000 mg | Freq: Once | INTRAVENOUS | Status: AC
  Administered 2022-12-26: 250 mg via INTRAVENOUS
  Filled 2022-12-26: qty 20

## 2022-12-26 MED ORDER — HEPARIN SOD (PORK) LOCK FLUSH 100 UNIT/ML IV SOLN
500.0000 [IU] | Freq: Once | INTRAVENOUS | Status: AC | PRN
  Administered 2022-12-26: 500 [IU]

## 2022-12-26 MED ORDER — SODIUM CHLORIDE 0.9 % IV SOLN
5.0000 mg/kg | Freq: Once | INTRAVENOUS | Status: AC
  Administered 2022-12-26: 500 mg via INTRAVENOUS
  Filled 2022-12-26: qty 4

## 2022-12-26 MED ORDER — SODIUM CHLORIDE 0.9% FLUSH
10.0000 mL | Freq: Once | INTRAVENOUS | Status: AC
  Administered 2022-12-26: 10 mL

## 2022-12-26 MED ORDER — SODIUM CHLORIDE 0.9% FLUSH
10.0000 mL | INTRAVENOUS | Status: DC | PRN
  Administered 2022-12-26: 10 mL

## 2022-12-26 NOTE — Patient Instructions (Addendum)
[facility] [facility]  Discharge Instructions: Thank you for choosing [facility] to provide your oncology and hematology care.   If you have a lab appointment with the Cancer Center, please go directly to the Cancer Center and check in at the registration area.   Wear comfortable clothing and clothing appropriate for easy access to any Portacath or PICC line.   We strive to give you quality time with your provider. You may need to reschedule your appointment if you arrive late (15 or more minutes).  Arriving late affects you and other patients whose appointments are after yours.  Also, if you miss three or more appointments without notifying the office, you may be dismissed from the clinic at the provider's discretion.      For prescription refill requests, have your pharmacy contact our office and allow 72 hours for refills to be completed.    Today you received the following chemotherapy and/or immunotherapy agents: Bevacizumab      To help prevent nausea and vomiting after your treatment, we encourage you to take your nausea medication as directed.  BELOW ARE SYMPTOMS THAT SHOULD BE REPORTED IMMEDIATELY: *FEVER GREATER THAN 100.4 F (38 C) OR HIGHER *CHILLS OR SWEATING *NAUSEA AND VOMITING THAT IS NOT CONTROLLED WITH YOUR NAUSEA MEDICATION *UNUSUAL SHORTNESS OF BREATH *UNUSUAL BRUISING OR BLEEDING *URINARY PROBLEMS (pain or burning when urinating, or frequent urination) *BOWEL PROBLEMS (unusual diarrhea, constipation, pain near the anus) TENDERNESS IN MOUTH AND THROAT WITH OR WITHOUT PRESENCE OF ULCERS (sore throat, sores in mouth, or a toothache) UNUSUAL RASH, SWELLING OR PAIN  UNUSUAL VAGINAL DISCHARGE OR ITCHING   Items with * indicate a potential emergency and should be followed up as soon as possible or go to the Emergency Department if any problems should occur.  Please show the CHEMOTHERAPY ALERT CARD or IMMUNOTHERAPY ALERT CARD at  check-in to the Emergency Department and triage nurse.  Should you have questions after your visit or need to cancel or reschedule your appointment, please contact [facility]  Dept: [phone]  and follow the prompts.  Office hours are 8:00 a.m. to 4:30 p.m. Monday - Friday. Please note that voicemails left after 4:00 p.m. may not be returned until the following business day.  We are closed weekends and major holidays. You have access to a nurse at all times for urgent questions. Please call the main number to the clinic Dept: [phone] and follow the prompts.   For any non-urgent questions, you may also contact your provider using MyChart. We now offer e-Visits for anyone 70 and older to request care online for non-urgent symptoms. For details visit [email or web address].   Also download the MyChart app! Go to the app store, search "MyChart", open the app, select [place], and log in with your MyChart username and password.  Sodium Ferric Gluconate Complex Injection What is this medication? SODIUM FERRIC GLUCONATE COMPLEX (SOE dee um FER ik GLOO koe nate KOM pleks) treats low levels of iron (iron deficiency anemia) in people with kidney disease. Iron is a mineral that plays an important role in making red blood cells, which carry oxygen from your lungs to the rest of your body. This medicine may be used for other purposes; ask your health care provider or pharmacist if you have questions. COMMON BRAND NAME(S): Ferrlecit, Nulecit What should I tell my care team before I take this medication? They need to know if you have any  of the following conditions: Anemia that is not from iron deficiency High levels of iron in the blood An unusual or allergic reaction to iron, other medications, foods, dyes, or preservatives Pregnant or are trying to become pregnant Breast-feeding How should I use this medication? This medication is injected into a vein. It  is given by your care team in a hospital or clinic setting. Talk to your care team about the use of this medication in children. While it may be prescribed for children as young as 6 years for selected conditions, precautions do apply. Overdosage: If you think you have taken too much of this medicine contact a poison control center or emergency room at once. NOTE: This medicine is only for you. Do not share this medicine with others. What if I miss a dose? It is important not to miss your dose. Call your care team if you are unable to keep an appointment. What may interact with this medication? Do not take this medication with any of the following: Deferasirox Deferoxamine Dimercaprol This medication may also interact with the following: Other iron products This list may not describe all possible interactions. Give your health care provider a list of all the medicines, herbs, non-prescription drugs, or dietary supplements you use. Also tell them if you smoke, drink alcohol, or use illegal drugs. Some items may interact with your medicine. What should I watch for while using this medication? Your condition will be monitored carefully while you are receiving this medication. Visit your care team for regular checks on your progress. You may need blood work while you are taking this medication. What side effects may I notice from receiving this medication? Side effects that you should report to your care team as soon as possible: Allergic reactions--skin rash, itching, hives, swelling of the face, lips, tongue, or throat Low blood pressure--dizziness, feeling faint or lightheaded, blurry vision Shortness of breath Side effects that usually do not require medical attention (report to your care team if they continue or are bothersome): Flushing Headache Joint pain Muscle pain Nausea Pain, redness, or irritation at injection site This list may not describe all possible side effects. Call your  doctor for medical advice about side effects. You may report side effects to FDA at 1-800-FDA-1088. Where should I keep my medication? This medication is given in a hospital or clinic and will not be stored at home. NOTE: This sheet is a summary. It may not cover all possible information. If you have questions about this medicine, talk to your doctor, pharmacist, or health care provider.  2024 Elsevier/Gold Standard (2020-08-25 00:00:00)

## 2022-12-26 NOTE — Progress Notes (Signed)
Per Dr. Mosetta Putt ok to treat today with hgb of 7.0

## 2022-12-27 ENCOUNTER — Other Ambulatory Visit: Payer: Self-pay

## 2022-12-27 ENCOUNTER — Inpatient Hospital Stay: Payer: Medicaid Other

## 2022-12-27 VITALS — BP 159/86 | HR 67 | Temp 99.2°F | Resp 16

## 2022-12-27 DIAGNOSIS — I780 Hereditary hemorrhagic telangiectasia: Secondary | ICD-10-CM | POA: Diagnosis not present

## 2022-12-27 DIAGNOSIS — D500 Iron deficiency anemia secondary to blood loss (chronic): Secondary | ICD-10-CM

## 2022-12-27 DIAGNOSIS — Z95828 Presence of other vascular implants and grafts: Secondary | ICD-10-CM

## 2022-12-27 DIAGNOSIS — D649 Anemia, unspecified: Secondary | ICD-10-CM

## 2022-12-27 MED ORDER — DIPHENHYDRAMINE HCL 25 MG PO CAPS
25.0000 mg | ORAL_CAPSULE | Freq: Once | ORAL | Status: AC
  Administered 2022-12-27: 25 mg via ORAL
  Filled 2022-12-27: qty 1

## 2022-12-27 MED ORDER — SODIUM CHLORIDE 0.9% FLUSH
10.0000 mL | Freq: Once | INTRAVENOUS | Status: AC
  Administered 2022-12-27: 10 mL

## 2022-12-27 MED ORDER — HEPARIN SOD (PORK) LOCK FLUSH 100 UNIT/ML IV SOLN
500.0000 [IU] | Freq: Once | INTRAVENOUS | Status: AC
  Administered 2022-12-27: 500 [IU]

## 2022-12-27 MED ORDER — SODIUM CHLORIDE 0.9% IV SOLUTION
250.0000 mL | Freq: Once | INTRAVENOUS | Status: AC
  Administered 2022-12-27: 250 mL via INTRAVENOUS

## 2022-12-27 MED ORDER — ACETAMINOPHEN 325 MG PO TABS
650.0000 mg | ORAL_TABLET | Freq: Once | ORAL | Status: AC
  Administered 2022-12-27: 650 mg via ORAL
  Filled 2022-12-27: qty 2

## 2022-12-27 NOTE — Patient Instructions (Signed)

## 2022-12-30 ENCOUNTER — Other Ambulatory Visit: Payer: Medicaid Other | Admitting: *Deleted

## 2022-12-30 LAB — BPAM RBC
Blood Product Expiration Date: 202409272359
Blood Product Expiration Date: 202410072359
ISSUE DATE / TIME: 202409131311
ISSUE DATE / TIME: 202409131311
Unit Type and Rh: 600
Unit Type and Rh: 600

## 2022-12-30 LAB — TYPE AND SCREEN
ABO/RH(D): A NEG
Antibody Screen: NEGATIVE
Unit division: 0
Unit division: 0

## 2022-12-30 NOTE — Patient Outreach (Signed)
Medicaid Managed Care   Unsuccessful Attempt Note   12/30/2022 Name: Peggy House MRN: [id number] DOB: [birth year]  Referred by: Olegario Messier, MD Reason for referral : High Risk Managed Medicaid (Unsuccessful RNCM initial telephone outreach)   An unsuccessful telephone outreach was attempted today. The patient was referred to the case management team for assistance with care management and care coordination.    Follow Up Plan: A HIPAA compliant phone message was left for the patient providing contact information and requesting a return call. and The Managed Medicaid care management team will reach out to the patient again over the next 7 days.    Estanislado Emms RN, BSN [place]  Value[facility]Based Care Institute [facility] Health RN Care Coordinator [phone]

## 2022-12-30 NOTE — Patient Instructions (Signed)
Visit Information  Ms. [name]  - as a part of your Medicaid benefit, you are eligible for care management and care coordination services at no cost or copay. I was unable to reach you by phone today but would be happy to help you with your health related needs. Please feel free to call me @ [phone].   A member of the Managed Medicaid care management team will reach out to you again over the next 7 days.   Estanislado Emms RN, BSN [place]  Value[facility]Based Care [facility] Health RN Care Coordinator [phone]

## 2023-01-08 ENCOUNTER — Other Ambulatory Visit: Payer: Self-pay

## 2023-01-08 DIAGNOSIS — I780 Hereditary hemorrhagic telangiectasia: Secondary | ICD-10-CM

## 2023-01-08 DIAGNOSIS — D500 Iron deficiency anemia secondary to blood loss (chronic): Secondary | ICD-10-CM

## 2023-01-08 DIAGNOSIS — D649 Anemia, unspecified: Secondary | ICD-10-CM

## 2023-01-09 ENCOUNTER — Telehealth: Payer: Self-pay

## 2023-01-09 ENCOUNTER — Inpatient Hospital Stay: Payer: Medicaid Other

## 2023-01-09 ENCOUNTER — Inpatient Hospital Stay: Payer: Medicaid Other | Admitting: Hematology

## 2023-01-09 NOTE — Assessment & Plan Note (Deleted)
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 -she has required multiple blood transfusions and APCs since 2019. 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 -She had multiple prolonged hospital stay due to severe GI bleeding.   -s/p IR embolization of gastric artery branch vessels on 12/10/17, cauterization of stomach for severe GI bleed in 11/2018, and epinephrine injection for bleeding ulcer on 11/03/20. -She has required frequent blood transfusion, IV iron and bevacizumab -She does respond to treatment, anemia improved with IV iron and bevacizumab, but she is not very compliant with her appointments.

## 2023-01-09 NOTE — Telephone Encounter (Signed)
I called the pt to remind her of her appointment today and I got no answer.   Tycen Dockter M,CMA

## 2023-01-20 ENCOUNTER — Telehealth: Payer: Self-pay

## 2023-01-20 NOTE — Telephone Encounter (Signed)
..  Medicaid Managed Care   Unsuccessful Outreach Note  01/20/2023 Name: Peggy House MRN: [id number] DOB: 08-10-1960  Referred by: Olegario Messier, MD Reason for referral : Appointment (I called the patient to reschedule her missed phone appt with the MM RNCM. I had to leave a message on her VM with my name and number.)   A second unsuccessful telephone outreach was attempted today. The patient was referred to the case management team for assistance with care management and care coordination.   Follow Up Plan: A HIPAA compliant phone message was left for the patient providing contact information and requesting a return call.  The care management team will reach out to the patient again over the next 7 days.   Weston Settle Care Guide  [facility] Managed  Care Guide [facility]  [phone]

## 2023-01-22 ENCOUNTER — Other Ambulatory Visit: Payer: Self-pay | Admitting: Internal Medicine

## 2023-01-22 ENCOUNTER — Telehealth: Payer: Self-pay | Admitting: Hematology

## 2023-01-22 ENCOUNTER — Other Ambulatory Visit: Payer: Self-pay | Admitting: Hematology

## 2023-01-22 DIAGNOSIS — F331 Major depressive disorder, recurrent, moderate: Secondary | ICD-10-CM

## 2023-01-23 ENCOUNTER — Inpatient Hospital Stay: Payer: Medicaid Other | Admitting: Hematology

## 2023-01-23 ENCOUNTER — Inpatient Hospital Stay: Payer: Medicaid Other | Attending: Hematology

## 2023-01-23 ENCOUNTER — Other Ambulatory Visit: Payer: Medicaid Other

## 2023-01-23 ENCOUNTER — Telehealth: Payer: Self-pay

## 2023-01-23 DIAGNOSIS — I780 Hereditary hemorrhagic telangiectasia: Secondary | ICD-10-CM | POA: Insufficient documentation

## 2023-01-23 DIAGNOSIS — D500 Iron deficiency anemia secondary to blood loss (chronic): Secondary | ICD-10-CM | POA: Insufficient documentation

## 2023-01-23 NOTE — Assessment & Plan Note (Deleted)
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 -she has required multiple blood transfusions and APCs since 2019. 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 -She had multiple prolonged hospital stay due to severe GI bleeding.   -s/p IR embolization of gastric artery branch vessels on 12/10/17, cauterization of stomach for severe GI bleed in 11/2018, and epinephrine injection for bleeding ulcer on 11/03/20. -She has required frequent blood transfusion, IV iron and bevacizumab -She does respond to treatment, anemia improved with IV iron and bevacizumab, but she is not very compliant with her appointments.

## 2023-01-23 NOTE — Telephone Encounter (Signed)
Tried calling pt regarding appts scheduled for today d/t pt is 1 hour late to 1st appt.  Unfortunately, pt did not answer the phone.  LVM stating to pt that she has missed her appts again today without calling.  Requested if pt would give Dr. Latanya Maudlin office a call.  Stated that Erie Noe will call pt to get her rescheduled but if pt miss next appts, pt will be dismissed from Dr. Latanya Maudlin care.  Requested again if pt would please call Dr. Latanya Maudlin office so pt can further discuss any disparities or challenges she have regarding getting to her appts.

## 2023-01-24 ENCOUNTER — Telehealth: Payer: Self-pay | Admitting: Hematology

## 2023-01-24 ENCOUNTER — Telehealth: Payer: Self-pay

## 2023-01-24 NOTE — Telephone Encounter (Signed)
..  Medicaid Managed Care   Unsuccessful Outreach Note  01/24/2023 Name: Peggy House MRN: [id number] DOB: October 06, 1960  Referred by: Olegario Messier, MD Reason for referral : Appointment   Third unsuccessful telephone outreach was attempted today. The patient was referred to the case management team for assistance with care management and care coordination. The patient's primary care provider has been notified of our unsuccessful attempts to make or maintain contact with the patient. The care management team is pleased to engage with this patient at any time in the future should he/she be interested in assistance from the care management team.   Follow Up Plan: We have been unable to make contact with the patient for follow up. The care management team is available to follow up with the patient after provider conversation with the patient regarding recommendation for care management engagement and subsequent re-referral to the care management team.   Weston Settle Care Guide  [facility] Managed  Care Guide [facility] Health  [phone]

## 2023-01-26 ENCOUNTER — Encounter: Payer: Self-pay | Admitting: Hematology

## 2023-01-28 DIAGNOSIS — M1712 Unilateral primary osteoarthritis, left knee: Secondary | ICD-10-CM | POA: Diagnosis not present

## 2023-01-31 ENCOUNTER — Inpatient Hospital Stay: Payer: Medicaid Other

## 2023-01-31 ENCOUNTER — Inpatient Hospital Stay (HOSPITAL_BASED_OUTPATIENT_CLINIC_OR_DEPARTMENT_OTHER): Payer: Medicaid Other | Admitting: Nurse Practitioner

## 2023-01-31 ENCOUNTER — Other Ambulatory Visit: Payer: Self-pay

## 2023-01-31 ENCOUNTER — Observation Stay (HOSPITAL_COMMUNITY)
Admission: EM | Admit: 2023-01-31 | Discharge: 2023-02-01 | Disposition: A | Discharge status: Home / Self Care | Payer: Medicaid Other | Attending: Internal Medicine | Admitting: Internal Medicine

## 2023-01-31 ENCOUNTER — Encounter (HOSPITAL_COMMUNITY): Payer: Self-pay

## 2023-01-31 VITALS — BP 151/63 | HR 60 | Temp 98.2°F | Resp 17 | Wt 232.8 lb

## 2023-01-31 DIAGNOSIS — Z7984 Long term (current) use of oral hypoglycemic drugs: Secondary | ICD-10-CM | POA: Insufficient documentation

## 2023-01-31 DIAGNOSIS — D500 Iron deficiency anemia secondary to blood loss (chronic): Secondary | ICD-10-CM

## 2023-01-31 DIAGNOSIS — D62 Acute posthemorrhagic anemia: Principal | ICD-10-CM | POA: Diagnosis present

## 2023-01-31 DIAGNOSIS — Z6841 Body Mass Index (BMI) 40.0 and over, adult: Secondary | ICD-10-CM | POA: Insufficient documentation

## 2023-01-31 DIAGNOSIS — F17210 Nicotine dependence, cigarettes, uncomplicated: Secondary | ICD-10-CM | POA: Diagnosis not present

## 2023-01-31 DIAGNOSIS — I780 Hereditary hemorrhagic telangiectasia: Secondary | ICD-10-CM

## 2023-01-31 DIAGNOSIS — Z79899 Other long term (current) drug therapy: Secondary | ICD-10-CM | POA: Insufficient documentation

## 2023-01-31 DIAGNOSIS — I10 Essential (primary) hypertension: Secondary | ICD-10-CM | POA: Insufficient documentation

## 2023-01-31 DIAGNOSIS — E119 Type 2 diabetes mellitus without complications: Secondary | ICD-10-CM | POA: Insufficient documentation

## 2023-01-31 DIAGNOSIS — D649 Anemia, unspecified: Principal | ICD-10-CM

## 2023-01-31 DIAGNOSIS — R0602 Shortness of breath: Secondary | ICD-10-CM | POA: Diagnosis present

## 2023-01-31 DIAGNOSIS — Z95828 Presence of other vascular implants and grafts: Secondary | ICD-10-CM

## 2023-01-31 LAB — CBC WITH DIFFERENTIAL (CANCER CENTER ONLY)
Abs Immature Granulocytes: 0.03 10*3/uL (ref 0.00–0.07)
Basophils Absolute: 0 10*3/uL (ref 0.0–0.1)
Basophils Relative: 0 %
Eosinophils Absolute: 0.2 10*3/uL (ref 0.0–0.5)
Eosinophils Relative: 2 %
HCT: 19.4 % — ABNORMAL LOW (ref 36.0–46.0)
Hemoglobin: 5.6 g/dL — CL (ref 12.0–15.0)
Immature Granulocytes: 0 %
Lymphocytes Relative: 19 %
Lymphs Abs: 1.6 10*3/uL (ref 0.7–4.0)
MCH: 23.4 pg — ABNORMAL LOW (ref 26.0–34.0)
MCHC: 28.9 g/dL — ABNORMAL LOW (ref 30.0–36.0)
MCV: 81.2 fL (ref 80.0–100.0)
Monocytes Absolute: 0.5 10*3/uL (ref 0.1–1.0)
Monocytes Relative: 6 %
Neutro Abs: 6.3 10*3/uL (ref 1.7–7.7)
Neutrophils Relative %: 73 %
Platelet Count: 213 10*3/uL (ref 150–400)
RBC: 2.39 MIL/uL — ABNORMAL LOW (ref 3.87–5.11)
RDW: 22.2 % — ABNORMAL HIGH (ref 11.5–15.5)
WBC Count: 8.6 10*3/uL (ref 4.0–10.5)
nRBC: 0 % (ref 0.0–0.2)

## 2023-01-31 LAB — CMP (CANCER CENTER ONLY)
ALT: <5 U/L (ref 0–44)
AST: 9 U/L — ABNORMAL LOW (ref 15–41)
Albumin: 3.7 g/dL (ref 3.5–5.0)
Alkaline Phosphatase: 60 U/L (ref 38–126)
Anion gap: 5 (ref 5–15)
BUN: 8 mg/dL (ref 8–23)
CO2: 24 mmol/L (ref 22–32)
Calcium: 8.8 mg/dL — ABNORMAL LOW (ref 8.9–10.3)
Chloride: 112 mmol/L — ABNORMAL HIGH (ref 98–111)
Creatinine: 0.88 mg/dL (ref 0.44–1.00)
GFR, Estimated: >60 mL/min (ref >60)
Glucose, Bld: 92 mg/dL (ref 70–99)
Potassium: 3.9 mmol/L (ref 3.5–5.1)
Sodium: 141 mmol/L (ref 135–145)
Total Bilirubin: 0.2 mg/dL — ABNORMAL LOW (ref 0.3–1.2)
Total Protein: 6.7 g/dL (ref 6.5–8.1)

## 2023-01-31 LAB — FERRITIN: Ferritin: 5 ng/mL — ABNORMAL LOW (ref 11–307)

## 2023-01-31 LAB — SAMPLE TO BLOOD BANK

## 2023-01-31 LAB — CBG MONITORING, ED: Glucose-Capillary: 110 mg/dL — ABNORMAL HIGH (ref 70–99)

## 2023-01-31 LAB — OCCULT BLOOD X 1 CARD TO LAB, STOOL: Fecal Occult Bld: NEGATIVE

## 2023-01-31 LAB — GLUCOSE, CAPILLARY: Glucose-Capillary: 102 mg/dL — ABNORMAL HIGH (ref 70–99)

## 2023-01-31 LAB — PREPARE RBC (CROSSMATCH)

## 2023-01-31 MED ORDER — DIPHENHYDRAMINE HCL 25 MG PO CAPS
50.0000 mg | ORAL_CAPSULE | ORAL | Status: DC | PRN
  Administered 2023-01-31: 50 mg via ORAL
  Filled 2023-01-31: qty 2

## 2023-01-31 MED ORDER — ACETAMINOPHEN 650 MG RE SUPP: 650.0000 mg | Freq: Four times a day (QID) | RECTAL | Status: DC | PRN

## 2023-01-31 MED ORDER — PANTOPRAZOLE SODIUM 40 MG PO TBEC
40.0000 mg | DELAYED_RELEASE_TABLET | Freq: Every day | ORAL | Status: DC
  Administered 2023-01-31 – 2023-02-01 (×2): 40 mg via ORAL
  Filled 2023-01-31 (×2): qty 1

## 2023-01-31 MED ORDER — AMLODIPINE BESYLATE 5 MG PO TABS
10.0000 mg | ORAL_TABLET | Freq: Every day | ORAL | Status: DC
  Administered 2023-02-01: 10 mg via ORAL
  Filled 2023-01-31: qty 2

## 2023-01-31 MED ORDER — ACETAMINOPHEN 325 MG PO TABS: 650.0000 mg | ORAL_TABLET | Freq: Four times a day (QID) | ORAL | Status: DC | PRN

## 2023-01-31 MED ORDER — CITALOPRAM HYDROBROMIDE 20 MG PO TABS
20.0000 mg | ORAL_TABLET | Freq: Every day | ORAL | Status: DC
  Administered 2023-01-31 – 2023-02-01 (×2): 20 mg via ORAL
  Filled 2023-01-31: qty 1
  Filled 2023-01-31: qty 2

## 2023-01-31 MED ORDER — ACETAMINOPHEN 325 MG PO TABS
650.0000 mg | ORAL_TABLET | ORAL | Status: DC | PRN
  Administered 2023-01-31: 650 mg via ORAL
  Filled 2023-01-31: qty 2

## 2023-01-31 MED ORDER — INSULIN ASPART 100 UNIT/ML IJ SOLN
0.0000 [IU] | Freq: Three times a day (TID) | INTRAMUSCULAR | Status: DC
  Administered 2023-02-01: 3 [IU] via SUBCUTANEOUS
  Filled 2023-01-31: qty 0.15

## 2023-01-31 MED ORDER — INSULIN ASPART 100 UNIT/ML IJ SOLN
0.0000 [IU] | Freq: Every day | INTRAMUSCULAR | Status: DC
  Filled 2023-01-31: qty 0.05

## 2023-01-31 MED ORDER — SODIUM CHLORIDE 0.9% IV SOLUTION: Freq: Once | INTRAVENOUS | Status: AC

## 2023-01-31 MED ORDER — ONDANSETRON HCL 4 MG/2ML IJ SOLN: 4.0000 mg | Freq: Four times a day (QID) | INTRAMUSCULAR | Status: DC | PRN

## 2023-01-31 MED ORDER — HYDRALAZINE HCL 20 MG/ML IJ SOLN
5.0000 mg | Freq: Four times a day (QID) | INTRAMUSCULAR | Status: DC | PRN
  Administered 2023-01-31 – 2023-02-01 (×2): 5 mg via INTRAVENOUS
  Filled 2023-01-31 (×2): qty 1

## 2023-01-31 MED ORDER — SODIUM CHLORIDE 0.9% FLUSH
10.0000 mL | Freq: Once | INTRAVENOUS | Status: AC
  Administered 2023-01-31: 10 mL

## 2023-01-31 MED ORDER — METFORMIN HCL 500 MG PO TABS
500.0000 mg | ORAL_TABLET | Freq: Two times a day (BID) | ORAL | Status: DC
  Administered 2023-01-31 – 2023-02-01 (×2): 500 mg via ORAL
  Filled 2023-01-31 (×2): qty 1

## 2023-01-31 MED ORDER — ALBUTEROL SULFATE (2.5 MG/3ML) 0.083% IN NEBU: 2.5000 mg | INHALATION_SOLUTION | RESPIRATORY_TRACT | Status: DC | PRN

## 2023-01-31 MED ORDER — ONDANSETRON HCL 4 MG PO TABS: 4.0000 mg | ORAL_TABLET | Freq: Four times a day (QID) | ORAL | Status: DC | PRN

## 2023-01-31 MED ORDER — ALPRAZOLAM 0.5 MG PO TABS: 0.5000 mg | ORAL_TABLET | Freq: Every evening | ORAL | Status: DC | PRN

## 2023-01-31 NOTE — ED Notes (Signed)
ED TO INPATIENT HANDOFF REPORT  Name/Age/Gender Peggy House 62 y.o. female  Code Status    Code Status Orders  (From admission, onward)           Start     Ordered   01/31/23 1647  Full code  Continuous       Question:  By:  Answer:  Consent: discussion documented in EHR   01/31/23 1647           Code Status History     Date Active Date Inactive Code Status Order ID Comments User Context   11/16/2022 0459 11/19/2022 0031 DNR [id number]  Rocky Morel, DO ED   09/26/2022 0054 09/28/2022 1634 Full Code [id number]  Marrianne Mood, MD ED   05/25/2022 2238 05/29/2022 1611 Full Code [id number]  Darlin Drop, DO ED   04/12/2022 0147 04/14/2022 2200 Full Code [id number]  Briscoe Deutscher, MD ED   11/01/2020 1115 11/05/2020 2104 DNR [id number]  Dolan Amen, MD ED   03/01/2020 1842 03/03/2020 2028 Full Code [id number]  Teddy Spike, DO Inpatient   12/04/2018 1617 12/08/2018 1859 Full Code [id number]  Tomma Lightning, MD Inpatient   12/03/2018 2025 12/04/2018 1616 Full Code [id number]  Albertine Grates, MD Inpatient   10/25/2017 1035 10/27/2017 0355 Full Code [id number]  Nyra Market, MD ED   10/15/2017 1919 10/22/2017 1928 Full Code [id number]  Reymundo Poll, MD Inpatient   10/06/2017 2002 10/10/2017 1932 Full Code [id number]  Levora Dredge, MD ED   09/29/2017 1726 10/03/2017 2237 Full Code [id number]  Darreld Mclean, MD Inpatient   09/22/2017 0122 09/25/2017 1505 Full Code [id number]  Tobey Grim, NP ED   05/07/2017 1839 05/11/2017 1639 Full Code [id number]  Valentino Nose, MD Inpatient   04/25/2014 2245 04/28/2014 1817 Full Code [id number]  Lorretta Harp, MD Inpatient   04/08/2014 0034 04/12/2014 1934 Full Code [id number]  Lynden Oxford, MD Inpatient       Home/SNF/Other Home  Chief Complaint ABLA (acute blood loss anemia) [D62]  Level of Care/Admitting Diagnosis ED Disposition     ED Disposition  Admit   Condition  --   Comment  Hospital [facility]: [facility] [[id number]]  Level of Care: Progressive [102]  Admit to Progressive based on following criteria: GI, ENDOCRINE disease patients with GI bleeding, acute liver failure or pancreatitis, stable with diabetic ketoacidosis or thyrotoxicosis (hypothyroid) state.  [facility] place patient in observation at [facility] or [facility] if equivalent level of care is available:: Yes  Covid Evaluation: Asymptomatic - no recent exposure (last 10 days) testing not required  Diagnosis: ABLA (acute blood loss anemia) [8338250]  Admitting Physician: Maryln Gottron [[id number]]  Attending Physician: Kirby Crigler, MIR [place] [[id number]]          Medical History 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    Allergies Allergies  Allergen Reactions   Feraheme [Ferumoxytol] Other (See Comments)    Muscle tetany, encephalopathy, fatigue, nausea (reaction to injection)   Nsaids Other (See Comments)    Stomach bleeding episodes; Tylenol is OK   Tomato Hives   Iron (  Ferrous Sulfate) [Ferrous Sulfate Er] Other (See Comments)    Shock   Wasp Venom Swelling    IV Location/Drains/Wounds Patient Lines/Drains/Airways Status     Active Line/Drains/Airways     Name Placement date Placement time Site Days   Implanted Port 07/08/18 Right Chest 07/08/18  --  Chest  1668            Labs/Imaging Results for orders placed or performed during the hospital encounter of 01/31/23 (from the past 48 hour(s))  Type and screen [facility]     Status: None (Preliminary result)   Collection Time: 01/31/23  4:03 PM  Result Value Ref Range   ABO/RH(D) PENDING     Antibody Screen PENDING    Sample Expiration      02/03/2023,2359 Performed at [facility], [address], [place], [place] [address]   Occult blood card to lab, stool     Status: None   Collection Time: 01/31/23  4:12 PM  Result Value Ref Range   Fecal Occult Bld NEGATIVE NEGATIVE    Comment: Performed at [facility], [address] [place], [place] [address]  Type and screen Ordered by PROVIDER DEFAULT     Status: None (Preliminary result)   Collection Time: 01/31/23  4:18 PM  Result Value Ref Range   ABO/RH(D) PENDING    Antibody Screen PENDING    Sample Expiration      02/03/2023,2359 Performed at [facility], [address], [place], [place] [address]    *Note: Due to a large number of results and/or encounters for the requested time period, some results have not been displayed. A complete set of results can be found in Results Review.   No results found.  Pending Labs Unresulted Labs (From admission, onward)     Start     Ordered   02/01/23 0500  Basic metabolic panel  Tomorrow morning,   R        01/31/23 1647   02/01/23 0500  CBC  Tomorrow morning,   R        01/31/23 1647   01/31/23 1647  Hemoglobin A1c  (Glycemic Control (SSI)  Q 4 Hours / Glycemic Control (SSI)  AC +/- HS)  Once,   R       Comments: To assess prior glycemic control    01/31/23 1647   01/31/23 1626  Prepare RBC (crossmatch)  (Blood Administration Adult)  Once,   R       Question Answer Comment  # of Units 2 units   Transfusion Indications Hemoglobin < 7 gm/dL and symptomatic   Number of Units to Keep Ahead NO units ahead   If emergent release call blood bank Not emergent release      01/31/23 1625            Vitals/Pain Today's Vitals   01/31/23 1540 01/31/23 1545 01/31/23 1600  BP: (!) 146/69 (!) 146/69 (!) 166/75  Pulse: (!) 59 (!) 56 60  Resp: 17 16 17   Temp: 98.3 F (36.8 C) 98.4 F (36.9 C)   TempSrc:  Oral    SpO2: 100% 100% 100%  PainSc: 0-No pain      Isolation Precautions No active isolations  Medications Medications  0.9 %  sodium chloride infusion (Manually program via Guardrails IV Fluids) (has no administration in time range)  amLODipine (NORVASC) tablet 10 mg (has no administration in time range)  ALPRAZolam (XANAX) tablet 0.5 mg (has  no administration in time range)  citalopram (CELEXA) tablet 20 mg (has no administration in time range)  metFORMIN (GLUCOPHAGE) tablet 500 mg (has no administration in time range)  pantoprazole (PROTONIX) EC tablet 40 mg (has no administration in time range)  insulin aspart (novoLOG) injection 0-15 Units (has no administration in time range)  insulin aspart (novoLOG) injection 0-5 Units (has no administration in time range)  acetaminophen (TYLENOL) tablet 650 mg (has no administration in time range)    Or  acetaminophen (TYLENOL) suppository 650 mg (has no administration in time range)  ondansetron (ZOFRAN) tablet 4 mg (has no administration in time range)    Or  ondansetron (ZOFRAN) injection 4 mg (has no administration in time range)  albuterol (PROVENTIL) (2.5 MG/3ML) 0.083% nebulizer solution 2.5 mg (has no administration in time range)    Mobility walks

## 2023-01-31 NOTE — ED Provider Notes (Signed)
[place] EMERGENCY DEPARTMENT AT [facility] Provider Note   CSN: [id number] Arrival date & time: 01/31/23  1533     History  Chief Complaint  Patient presents with   Shortness of Breath    Peggy House is a 62 y.o. female.  Patient sent here from clinic for hemoglobin of 5.6.  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  She endorses nosebleeds over the last several days.  Has not noticed much blood in her stool.  She was feeling more short of breath than normal had a blood count checked and was sent here for evaluation.  Denies any nosebleed or GI bleeding at this time.  Denies any chest pain weakness numbness tingling.  The history is provided by the patient.       Home Medications Prior to Admission medications   Medication Sig Start Date End Date Taking? Authorizing Provider  Accu-Chek Softclix Lancets lancets Use to check blood sugar before breakfast and before dinner while on steroids Patient taking differently: 1 each by Other route See admin instructions. Use to check blood sugar before breakfast and before dinner while on steroids 09/27/19   Claudean Severance, MD  ALPRAZolam Prudy Feeler) 0.5 MG tablet TAKE 1 TABLET BY MOUTH AT BEDTIME AS NEEDED FOR ANXIETY OR SLEEP 12/07/22   Malachy Mood, MD  amLODipine (NORVASC) 10 MG tablet TAKE 1 TABLET(10 MG) BY MOUTH DAILY Patient taking differently: Take 10 mg by mouth daily. 06/12/22   Pollyann Samples, NP  citalopram (CELEXA) 20 MG tablet Take 1 tablet (20 mg total) by mouth daily. 06/20/22 06/20/23  Nooruddin, Jason Fila, MD  glucose blood (ACCU-CHEK GUIDE) test strip Check blood sugar 2 times per day while on steroids before breakfast and dinner Patient taking differently: 1 each by Other route See admin instructions. Check blood sugar 2 times per day while on steroids before breakfast and dinner 09/27/19   Claudean Severance, MD  metFORMIN (GLUCOPHAGE) 500 MG  tablet TAKE 1 TABLET(500 MG) BY MOUTH TWICE DAILY WITH A MEAL 01/24/23   Nooruddin, Jason Fila, MD  nicotine (NICODERM CQ - DOSED IN MG/24 HOURS) 14 mg/24hr patch Place 1 patch (14 mg total) onto the skin daily. Patient taking differently: Place 14 mg onto the skin daily. 06/20/22 06/20/23  Nooruddin, Jason Fila, MD  pantoprazole (PROTONIX) 40 MG tablet TAKE 1 TABLET(40 MG) BY MOUTH TWICE DAILY Patient taking differently: Take 40 mg by mouth daily. 09/30/22   Malachy Mood, MD  PROAIR HFA [phone] 1091 Base) MCG/ACT inhaler INHALE 1 TO 2 PUFFS INTO THE LUNGS EVERY 6 HOURS AS NEEDED FOR WHEEZING OR SHORTNESS OF BREATH Patient taking differently: Inhale 1-2 puffs into the lungs every 6 (six) hours as needed for wheezing or shortness of breath. 11/10/20   Malachy Mood, MD  traZODone (DESYREL) 100 MG tablet TAKE 1 TABLET BY MOUTH AT BEDTIME 12/07/22   Malachy Mood, MD  TYLENOL PM EXTRA STRENGTH 500-25 MG TABS tablet Take 2 tablets by mouth at bedtime as needed (for sleep, knee pain).    [provider]      Allergies    Feraheme [ferumoxytol], Nsaids, Tomato, Iron (ferrous sulfate) [ferrous sulfate er], and Wasp venom    Review of Systems   Review of Systems  Physical Exam Updated Vital Signs BP (!) 166/75   Pulse 60   Temp 98.4 F (36.9 C) (Oral)   Resp 17   SpO2 100%  Physical Exam Vitals and  nursing note reviewed.  Constitutional:      General: She is not in acute distress.    Appearance: She is well-developed.  HENT:     Head: Normocephalic and atraumatic.     Mouth/Throat:     Mouth: Mucous membranes are moist.     Pharynx: Oropharynx is clear.  Eyes:     Extraocular Movements: Extraocular movements intact.     Conjunctiva/sclera: Conjunctivae normal.     Pupils: Pupils are equal, round, and reactive to light.  Cardiovascular:     Rate and Rhythm: Normal rate and regular rhythm.     Pulses: Normal pulses.     Heart sounds: Normal heart sounds. No murmur heard. Pulmonary:     Effort: Pulmonary  effort is normal. No respiratory distress.     Breath sounds: Normal breath sounds.  Abdominal:     Palpations: Abdomen is soft.     Tenderness: There is no abdominal tenderness.  Genitourinary:    Comments: Stool looks grossly brown Musculoskeletal:        General: No swelling. Normal range of motion.     Cervical back: Normal range of motion and neck supple.  Skin:    General: Skin is warm and dry.     Capillary Refill: Capillary refill takes less than 2 seconds.  Neurological:     General: No focal deficit present.     Mental Status: She is alert.  Psychiatric:        Mood and Affect: Mood normal.     ED Results / Procedures / Treatments   Labs (all labs ordered are listed, but only abnormal results are displayed) Labs Reviewed  OCCULT BLOOD X 1 CARD TO LAB, STOOL  TYPE AND SCREEN  PREPARE RBC (CROSSMATCH)    EKG None  Radiology No results found.  Procedures .Critical Care  Performed by: Virgina Norfolk, DO Authorized by: Virgina Norfolk, DO   Critical care provider statement:    Critical care time (minutes):  35   Critical care was necessary to treat or prevent imminent or life-threatening deterioration of the following conditions: hemoglobin 5.6.   Critical care was time spent personally by me on the following activities:  Blood draw for specimens, development of treatment plan with patient or surrogate, discussions with primary provider, evaluation of patient's response to treatment, examination of patient, obtaining history from patient or surrogate, ordering and review of radiographic studies, pulse oximetry, re-evaluation of patient's condition, ordering and review of laboratory studies, ordering and performing treatments and interventions and review of old charts   I assumed direction of critical care for this patient from another provider in my specialty: no       Medications Ordered in ED Medications  0.9 %  sodium chloride infusion (Manually program via  Guardrails IV Fluids) (has no administration in time range)    ED Course/ Medical Decision Making/ A&P                                 Medical Decision Making Amount and/or Complexity of Data Reviewed Labs: ordered.   Peggy House is here with shortness of breath.  Found to have a hemoglobin of 5.6 and clinic today and sent for evaluation.  History of hereditary hemorrhagic telangiectasia from GI bleeds and nosebleeds.  She endorses nosebleeds here recently.  Has not noticed any blood in her stool.  Her stool appears grossly brown on exam.  Per  my review interpretation of labs her hemoglobin was 5.6.  Otherwise lab work was unremarkable.  Given her complex history will admit her for transfusion and observation to make sure that hemoglobin responds appropriately.  Do not think she needs emergent GI workup at this time.  Hemoccult has been sent.  Hemodynamically she is stable.  This chart was dictated using voice recognition software.  Despite best efforts to proofread,  errors can occur which can change the documentation meaning.         Final Clinical Impression(s) / ED Diagnoses Final diagnoses:  Symptomatic anemia    Rx / DC Orders ED Discharge Orders     None         Virgina Norfolk, DO 01/31/23 1627

## 2023-01-31 NOTE — Assessment & Plan Note (Addendum)
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 -she has required multiple blood transfusions and APCs since 2019. 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 -She had multiple prolonged hospital stay due to severe GI bleeding.   -s/p IR embolization of gastric artery branch vessels on 12/10/17, cauterization of stomach for severe GI bleed in 11/2018, and epinephrine injection for bleeding ulcer on 11/03/20. -She has required frequent blood transfusion, IV iron and bevacizumab -She does respond to treatment, anemia improves with IV iron and bevacizumab, but she is not very compliant with her appointments.  Last CBC Lab Results  Component Value Date   WBC 9.5 12/26/2022   HGB 7.0 (L) 12/26/2022   HCT 23.3 (L) 12/26/2022   MCV 82.0 12/26/2022   MCH 24.6 (L) 12/26/2022   RDW 24.9 (H) 12/26/2022   PLT 429 (H) 12/26/2022    She did miss her most recent appointment 01/23/2023

## 2023-01-31 NOTE — ED Triage Notes (Signed)
[facility] 5.6. Pt has had several nosebleeds over the last few days- endorses SHOB, light-headedness.

## 2023-01-31 NOTE — Progress Notes (Signed)
CRITICAL VALUE STICKER  CRITICAL VALUE: Hgb. 5.6   RECEIVER (on-site recipient of call): Sharlette Dense CMA  DATE & TIME NOTIFIED: 01/31/2023 1500  MESSENGER (representative from lab): Melissa in Lab  MD NOTIFIED: Vincent Gros NP  TIME OF NOTIFICATION: 1500  RESPONSE: Made physician aware

## 2023-01-31 NOTE — Progress Notes (Signed)
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 -she has required multiple blood transfusions and APCs since 2019. 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 -She had multiple prolonged hospital stay due to severe GI bleeding.   -s/p IR embolization of gastric artery branch vessels on 12/10/17, cauterization of stomach for severe GI bleed in 11/2018, and epinephrine injection for bleeding ulcer on 11/03/20. -She has required frequent blood transfusion, IV iron and bevacizumab -She does respond to treatment, anemia improves with IV iron and bevacizumab, but she is not very compliant with her appointments.  Last CBC Lab Results  Component Value Date   WBC 9.5 12/26/2022   HGB 7.0 (L) 12/26/2022   HCT 23.3 (L) 12/26/2022   MCV 82.0 12/26/2022   MCH 24.6 (L) 12/26/2022   RDW 24.9 (H) 12/26/2022   PLT 429 (H) 12/26/2022    She did miss her most recent appointment 01/23/2023  Plan:  Today, critical lab value of Hgb 5.6 called to clinic upon patient arrival. She reports fatigue, shortness of breath with exertion, and severe nosebleeds.     Latest Ref Rng & Units 01/31/2023    2:30 PM 12/26/2022   12:38 PM 12/12/2022    1:30 PM  CBC  WBC 4.0 - 10.5 K/uL 8.6  9.5  13.5   Hemoglobin 12.0 - 15.0 g/dL 5.6  7.0  7.7   Hematocrit 36.0 - 46.0 % 19.4  23.3  25.0   Platelets 150 - 400 K/uL 213  429  267   Due to positive symptoms and severely low Hgb, patient was taken to ED for further evaluation and immediate management.  Labs with follow up and bevacizumab a few days following discharge from hospital, assuming admission today. This was discussed with patient prior to being taken to ED. She  voiced understanding and agreement with the plan.   The total time spent in the appointment was 25 minutes encounter with patients including review of chart and various tests results, discussions about plan of care and coordination of care plan   All questions were answered. The patient knows to call the clinic with any problems, questions or concerns. No barriers to learning was detected.    Carlean Jews, NP 10/18/20243:41 PM  INTERVAL HISTORY: Peggy House 62 y.o. female returns for HHT (hereditary hemorrhagic telangiectasia) with severe anemia due to chronic GI bleeding. Today, she presents to the clinic alone. She reports moderate to severe fatigue and dyspnea with exertion for the past week. She states this is not her baseline. She states that she did have significant nose bleed last week. DOE and fatigue started at that time. She had another, more recent nose bleed on 01/29/2023, which caused symptoms to be even worse.  She denies chest pain or chest pressure. Has had some mild swelling in both feet and ankles.  She denies abdominal pain, nausea, or vomiting. She states that she has had some diarrhea. Denies melena or frank blood in stool. Denies coffee ground emesis. She denies headache or dizziness.   SUMMARY OF HEMATOLOGIC HISTORY: -She has had multiple prolonged hospital stays due to severe GI bleeding.   -s/p embolization of gastric artery branch vessels on 12/10/17, cauterization of stomach  for severe GI bleed in 11/2018, and epinephrine injection for bleeding ulcer on 11/03/20.  She has required frequent blood transfusions, IV iron, and bevacizumab -She does respond to treatment, anemia improves with IV iron and bevacizumab, but she is not very compliant with her appointments. She missed most recent scheduled appointment on 01/23/2023.   I have reviewed the past medical history, past surgical history, social history and family history with the patient and they are  unchanged from previous note.  ALLERGIES:  is allergic to feraheme [ferumoxytol], nsaids, tomato, iron (ferrous sulfate) [ferrous sulfate er], and wasp venom.  MEDICATIONS:  No current facility-administered medications for this visit.   Current Outpatient Medications  Medication Sig Dispense Refill   Accu-Chek Softclix Lancets lancets Use to check blood sugar before breakfast and before dinner while on steroids (Patient taking differently: 1 each by Other route See admin instructions. Use to check blood sugar before breakfast and before dinner while on steroids) 100 each 1   ALPRAZolam (XANAX) 0.5 MG tablet TAKE 1 TABLET BY MOUTH AT BEDTIME AS NEEDED FOR ANXIETY OR SLEEP 7 tablet 0   amLODipine (NORVASC) 10 MG tablet TAKE 1 TABLET(10 MG) BY MOUTH DAILY (Patient taking differently: Take 10 mg by mouth daily.) 90 tablet 3   citalopram (CELEXA) 20 MG tablet Take 1 tablet (20 mg total) by mouth daily. 90 tablet 3   glucose blood (ACCU-CHEK GUIDE) test strip Check blood sugar 2 times per day while on steroids before breakfast and dinner (Patient taking differently: 1 each by Other route See admin instructions. Check blood sugar 2 times per day while on steroids before breakfast and dinner) 50 each 3   metFORMIN (GLUCOPHAGE) 500 MG tablet TAKE 1 TABLET(500 MG) BY MOUTH TWICE DAILY WITH A MEAL 180 tablet 3   nicotine (NICODERM CQ - DOSED IN MG/24 HOURS) 14 mg/24hr patch Place 1 patch (14 mg total) onto the skin daily. (Patient taking differently: Place 14 mg onto the skin daily.) 30 patch 3   pantoprazole (PROTONIX) 40 MG tablet TAKE 1 TABLET(40 MG) BY MOUTH TWICE DAILY (Patient taking differently: Take 40 mg by mouth daily.) 180 tablet 1   PROAIR HFA 108 (90 Base) MCG/ACT inhaler INHALE 1 TO 2 PUFFS INTO THE LUNGS EVERY 6 HOURS AS NEEDED FOR WHEEZING OR SHORTNESS OF BREATH (Patient taking differently: Inhale 1-2 puffs into the lungs every 6 (six) hours as needed for wheezing or shortness of breath.) 8.5 g  0   traZODone (DESYREL) 100 MG tablet TAKE 1 TABLET BY MOUTH AT BEDTIME 7 tablet 0   TYLENOL PM EXTRA STRENGTH 500-25 MG TABS tablet Take 2 tablets by mouth at bedtime as needed (for sleep, knee pain).     Facility-Administered Medications Ordered in Other Visits  Medication Dose Route Frequency Provider Last Rate Last Admin   diphenhydrAMINE (BENADRYL) 25 mg capsule            diphenhydrAMINE (BENADRYL) 25 mg capsule            phenylephrine (NEO-SYNEPHRINE) 1 % nasal drops 2 drop  2 drop Left Nare Once Malachy Mood, MD         REVIEW OF SYSTEMS:   Constitutional: Denies fevers, chills or night sweats. Moderate fatigue  Eyes: Denies blurriness of vision Ears, nose, mouth, throat, and face: Denies mucositis or sore throat. Has had two severe nosebleeds in past week. Respiratory: Denies cough. Reports dyspnea with exertion.  Cardiovascular: Denies palpitation, chest discomfort or lower extremity swelling Gastrointestinal:  Denies  nausea, heartburn or change in bowel habits. Reports some new diarrhea.  Skin: Denies abnormal skin rashes Lymphatics: Denies new lymphadenopathy or easy bruising Neurological:Denies numbness, tingling or new weaknesses Behavioral/Psych: Mood is stable, no new changes  All other systems were reviewed with the patient and are negative.  PHYSICAL EXAMINATION: ECOG PERFORMANCE STATUS: 2 - Symptomatic, <50% confined to bed  Vitals:   01/31/23 1449  BP: (!) 151/63  Pulse: 60  Resp: 17  Temp: 98.2 F (36.8 C)  SpO2: 100%   Filed Weights   01/31/23 1449  Weight: 232 lb 12.8 oz (105.6 kg)    GENERAL:alert, no distress and comfortable SKIN: skin color, texture, turgor are normal, no rashes or significant lesions EYES: normal, Conjunctiva are pink and non-injected, sclera clear OROPHARYNX:no exudate, no erythema and lips, buccal mucosa, and tongue normal  NECK: supple, thyroid normal size, non-tender, without nodularity LYMPH:  no palpable lymphadenopathy in  the cervical, axillary or inguinal LUNGS: clear to auscultation and percussion with normal breathing effort. Mild wheezing noted. HEART: regular rate & rhythm. Soft, systolic murmur noted.  ABDOMEN:abdomen soft, non-tender and normal bowel sounds Musculoskeletal:no cyanosis of digits and no clubbing  NEURO: alert & oriented x 3 with fluent speech, no focal motor/sensory deficits  LABORATORY DATA:  I have reviewed the data as listed     Component Value Date/Time   NA 141 01/31/2023 1430   NA 147 (H) 04/02/2017 1708   NA 141 02/05/2017 1407   K 3.9 01/31/2023 1430   K 3.5 02/05/2017 1407   CL 112 (H) 01/31/2023 1430   CO2 24 01/31/2023 1430   CO2 24 02/05/2017 1407   GLUCOSE 92 01/31/2023 1430   GLUCOSE 134 02/05/2017 1407   BUN 8 01/31/2023 1430   BUN 5 (L) 04/02/2017 1708   BUN 9.3 02/05/2017 1407   CREATININE 0.88 01/31/2023 1430   CREATININE 0.9 02/05/2017 1407   CALCIUM 8.8 (L) 01/31/2023 1430   CALCIUM 8.9 02/05/2017 1407   PROT 6.7 01/31/2023 1430   PROT 6.7 04/02/2017 1708   PROT 7.1 02/05/2017 1407   ALBUMIN 3.7 01/31/2023 1430   ALBUMIN 3.8 04/02/2017 1708   ALBUMIN 3.5 02/05/2017 1407   AST 9 (L) 01/31/2023 1430   AST 13 02/05/2017 1407   ALT <5 01/31/2023 1430   ALT 8 02/05/2017 1407   ALKPHOS 60 01/31/2023 1430   ALKPHOS 84 02/05/2017 1407   BILITOT 0.2 (L) 01/31/2023 1430   BILITOT 0.23 02/05/2017 1407   GFRNONAA >60 01/31/2023 1430   GFRAA >60 01/06/2020 0921   Lab Results  Component Value Date   WBC 8.6 01/31/2023   NEUTROABS 6.3 01/31/2023   HGB 5.6 (LL) 01/31/2023   HCT 19.4 (L) 01/31/2023   MCV 81.2 01/31/2023   PLT 213 01/31/2023

## 2023-01-31 NOTE — H&P (Addendum)
History and Physical  Peggy House ZOX:[id number] DOB: 1960/10/23 DOA: 01/31/2023  PCP: Olegario Messier, MD   Chief Complaint: Anemia  HPI: Peggy House is a 62 y.o. 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  Patient states she has actually been feeling fine for the most part, she typically gets nosebleeds every 2 to 3 days, last nosebleed was 2 days ago, now more severe than usual.  Today she presented to the hematology clinic for her scheduled iron and bevacizumab infusion was noted on labs to have a hemoglobin of 5.6 and was therefore sent to the ER for transfusion and evaluation.  She denies any abdominal pain, hematemesis, dark stools, hematochezia.  Endorses maybe some slight sensation of tiredness but otherwise review of systems is negative.  ED Course: Vital signs in the emergency department are stable and unremarkable other than some hypertension.  CBC and CMP remarkable for hemoglobin 5.6.  ER provider describes brown stool on rectal exam, fecal occult blood negative.  They have ordered 2 unit blood transfusion, and requested hospitalist observation.  Review of Systems: Please see HPI for pertinent positives and negatives. A complete 10 system review of systems are otherwise negative.  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   Past Surgical History:  Procedure Laterality Date   ABDOMINAL HYSTERECTOMY     CARPAL TUNNEL RELEASE  05/13/2011   Procedure: CARPAL TUNNEL RELEASE;   Surgeon: Mable Paris, MD;  Location: [facility];  Service: Orthopedics;  Laterality: Left;   COLONOSCOPY N/A 03/02/2020   Procedure: COLONOSCOPY;  Surgeon: Bernette Redbird, MD;  Location: [facility] ENDOSCOPY;  Service: Endoscopy;  Laterality: N/A;   COLONOSCOPY WITH PROPOFOL N/A 04/28/2014   Procedure: COLONOSCOPY WITH PROPOFOL;  Surgeon: Florencia Reasons, MD;  Location: [facility] ENDOSCOPY;  Service: Endoscopy;  Laterality: N/A;   DG TOES*L*  2/10   rt   DILATION AND CURETTAGE OF UTERUS     ENTEROSCOPY N/A 10/17/2017   Procedure: ENTEROSCOPY;  Surgeon: Kathi Der, MD;  Location: [facility] ENDOSCOPY;  Service: Gastroenterology;  Laterality: N/A;   ESOPHAGOGASTRODUODENOSCOPY N/A 04/10/2014   Procedure: ESOPHAGOGASTRODUODENOSCOPY (EGD);  Surgeon: Shirley Friar, MD;  Location: [facility] ENDOSCOPY;  Service: Endoscopy;  Laterality: N/A;   ESOPHAGOGASTRODUODENOSCOPY N/A 05/10/2017   Procedure: ESOPHAGOGASTRODUODENOSCOPY (EGD);  Surgeon: Bernette Redbird, MD;  Location: [facility] ENDOSCOPY;  Service: Endoscopy;  Laterality: N/A;   ESOPHAGOGASTRODUODENOSCOPY N/A 09/22/2017   Procedure: ESOPHAGOGASTRODUODENOSCOPY (EGD);  Surgeon: Vida Rigger, MD;  Location: [facility] ENDOSCOPY;  Service: Endoscopy;  Laterality: N/A;  bedside   ESOPHAGOGASTRODUODENOSCOPY N/A 03/02/2020   Procedure: ESOPHAGOGASTRODUODENOSCOPY (EGD);  Surgeon: Bernette Redbird, MD;  Location: [facility]COPY;  Service: Endoscopy;  Laterality: N/A;   ESOPHAGOGASTRODUODENOSCOPY N/A 11/03/2020   Procedure: ESOPHAGOGASTRODUODENOSCOPY (EGD);  Surgeon: Charlott Rakes, MD;  Location: [facility] ENDOSCOPY;  Service: Endoscopy;  Laterality: N/A;   ESOPHAGOGASTRODUODENOSCOPY N/A 04/12/2022   Procedure: ESOPHAGOGASTRODUODENOSCOPY (EGD);  Surgeon: Vida Rigger, MD;  Location: [facility] ENDOSCOPY;  Service: Gastroenterology;  Laterality: N/A;   ESOPHAGOGASTRODUODENOSCOPY N/A 05/27/2022  Procedure: ESOPHAGOGASTRODUODENOSCOPY (EGD);  Surgeon: Willis Modena, MD;  Location: [facility]  ENDOSCOPY;  Service: Gastroenterology;  Laterality: N/A;   ESOPHAGOGASTRODUODENOSCOPY N/A 09/27/2022   Procedure: ESOPHAGOGASTRODUODENOSCOPY (EGD);  Surgeon: Lynann Bologna, DO;  Location: [facility] ENDOSCOPY;  Service: Gastroenterology;  Laterality: N/A;   ESOPHAGOGASTRODUODENOSCOPY (EGD) WITH PROPOFOL N/A 04/27/2014   Procedure: ESOPHAGOGASTRODUODENOSCOPY (EGD) WITH PROPOFOL;  Surgeon: Florencia Reasons, MD;  Location: [facility] ENDOSCOPY;  Service: Endoscopy;  Laterality: N/A;  possible apc   ESOPHAGOGASTRODUODENOSCOPY (EGD) WITH PROPOFOL N/A 09/30/2017   Procedure: ESOPHAGOGASTRODUODENOSCOPY (EGD) WITH PROPOFOL;  Surgeon: Kerin Salen, MD;  Location: [facility] ENDOSCOPY;  Service: Gastroenterology;  Laterality: N/A;   ESOPHAGOGASTRODUODENOSCOPY (EGD) WITH PROPOFOL N/A 10/01/2017   Procedure: ESOPHAGOGASTRODUODENOSCOPY (EGD) WITH PROPOFOL;  Surgeon: Kerin Salen, MD;  Location: [facility] ENDOSCOPY;  Service: Gastroenterology;  Laterality: N/A;   ESOPHAGOGASTRODUODENOSCOPY (EGD) WITH PROPOFOL N/A 10/08/2017   Procedure: ESOPHAGOGASTRODUODENOSCOPY (EGD) WITH PROPOFOL;  Surgeon: Kathi Der, MD;  Location: [facility] ENDOSCOPY;  Service: Gastroenterology;  Laterality: N/A;   ESOPHAGOGASTRODUODENOSCOPY (EGD) WITH PROPOFOL N/A 10/17/2017   Procedure: ESOPHAGOGASTRODUODENOSCOPY (EGD) WITH PROPOFOL;  Surgeon: Kathi Der, MD;  Location: [facility] ENDOSCOPY;  Service: Gastroenterology;  Laterality: N/A;   ESOPHAGOGASTRODUODENOSCOPY (EGD) WITH PROPOFOL N/A 10/19/2017   Procedure: ESOPHAGOGASTRODUODENOSCOPY (EGD) WITH PROPOFOL;  Surgeon: Kathi Der, MD;  Location: [facility] ENDOSCOPY;  Service: Gastroenterology;  Laterality: N/A;   ESOPHAGOGASTRODUODENOSCOPY (EGD) WITH PROPOFOL N/A 12/04/2018   Procedure: ESOPHAGOGASTRODUODENOSCOPY (EGD) WITH PROPOFOL;  Surgeon: Charlott Rakes, MD;  Location: [facility] ENDOSCOPY;  Service: Endoscopy;  Laterality: N/A;   GIVENS CAPSULE STUDY N/A 10/02/2017   Procedure: GIVENS CAPSULE STUDY;  Surgeon: Kerin Salen, MD;   Location: [facility] ENDOSCOPY;  Service: Gastroenterology;  Laterality: N/A;   GIVENS CAPSULE STUDY N/A 10/08/2017   Procedure: GIVENS CAPSULE STUDY;  Surgeon: Kathi Der, MD;  Location: [facility] ENDOSCOPY;  Service: Gastroenterology;  Laterality: N/A;  endoscopic placement of capsule   GIVENS CAPSULE STUDY N/A 03/02/2020   Procedure: GIVENS CAPSULE STUDY;  Surgeon: Bernette Redbird, MD;  Location: [facility] ENDOSCOPY;  Service: Endoscopy;  Laterality: N/A;   HEMOSTASIS CLIP PLACEMENT  12/04/2018   Procedure: HEMOSTASIS CLIP PLACEMENT;  Surgeon: Charlott Rakes, MD;  Location: [facility] ENDOSCOPY;  Service: Endoscopy;;   HEMOSTASIS CLIP PLACEMENT  05/27/2022   Procedure: HEMOSTASIS CLIP PLACEMENT;  Surgeon: Willis Modena, MD;  Location: [facility] ENDOSCOPY;  Service: Gastroenterology;;   HEMOSTASIS CLIP PLACEMENT  09/27/2022   Procedure: HEMOSTASIS CLIP PLACEMENT;  Surgeon: Lynann Bologna, DO;  Location: [facility] ENDOSCOPY;  Service: Gastroenterology;;   HEMOSTASIS CONTROL  05/27/2022   Procedure: HEMOSTASIS CONTROL;  Surgeon: Willis Modena, MD;  Location: [facility] ENDOSCOPY;  Service: Gastroenterology;;   HOT HEMOSTASIS N/A 04/27/2014   Procedure: HOT HEMOSTASIS (ARGON PLASMA COAGULATION/BICAP);  Surgeon: Florencia Reasons, MD;  Location: [facility] ENDOSCOPY;  Service: Endoscopy;  Laterality: N/A;   HOT HEMOSTASIS N/A 09/30/2017   Procedure: HOT HEMOSTASIS (ARGON PLASMA COAGULATION/BICAP);  Surgeon: Kerin Salen, MD;  Location: [facility] ENDOSCOPY;  Service: Gastroenterology;  Laterality: N/A;   HOT HEMOSTASIS N/A 10/01/2017   Procedure: HOT HEMOSTASIS (ARGON PLASMA COAGULATION/BICAP);  Surgeon: Kerin Salen, MD;  Location: [facility] ENDOSCOPY;  Service: Gastroenterology;  Laterality: N/A;   HOT HEMOSTASIS N/A 10/17/2017   Procedure: HOT HEMOSTASIS (ARGON PLASMA COAGULATION/BICAP);  Surgeon: Kathi Der, MD;  Location: [facility] ENDOSCOPY;  Service: Gastroenterology;  Laterality: N/A;   HOT HEMOSTASIS N/A 10/19/2017   Procedure: HOT HEMOSTASIS (ARGON PLASMA  COAGULATION/BICAP);  Surgeon: Kathi Der, MD;  Location: [facility] ENDOSCOPY;  Service: Gastroenterology;  Laterality: N/A;   HOT HEMOSTASIS N/A  03/02/2020   Procedure: HOT HEMOSTASIS (ARGON PLASMA COAGULATION/BICAP);  Surgeon: Bernette Redbird, MD;  Location: [facility]COPY;  Service: Endoscopy;  Laterality: N/A;   HOT HEMOSTASIS N/A 04/12/2022   Procedure: HOT HEMOSTASIS (ARGON PLASMA COAGULATION/BICAP);  Surgeon: Vida Rigger, MD;  Location: [facility] ENDOSCOPY;  Service: Gastroenterology;  Laterality: N/A;   IR IMAGING GUIDED PORT INSERTION  07/08/2018   L shoulder Surgery  2011   POLYPECTOMY  03/02/2020   Procedure: POLYPECTOMY;  Surgeon: Bernette Redbird, MD;  Location: [facility] ENDOSCOPY;  Service: Endoscopy;;   SCLEROTHERAPY  11/03/2020   Procedure: Susa Day;  Surgeon: Charlott Rakes, MD;  Location: [facility] ENDOSCOPY;  Service: Endoscopy;;   SUBMUCOSAL INJECTION  09/22/2017   Procedure: SUBMUCOSAL INJECTION;  Surgeon: Vida Rigger, MD;  Location: [facility] ENDOSCOPY;  Service: Endoscopy;;   SUBMUCOSAL INJECTION  12/04/2018   Procedure: SUBMUCOSAL INJECTION;  Surgeon: Charlott Rakes, MD;  Location: [facility] ENDOSCOPY;  Service: Endoscopy;;    Social History:  reports that she has been smoking cigarettes. She has a 15 pack-year smoking history. She quit smokeless tobacco use about 43 years ago.  Her smokeless tobacco use included snuff. She reports that she does not drink alcohol and does not use drugs.   Allergies  Allergen Reactions   Feraheme [Ferumoxytol] Other (See Comments)    Muscle tetany, encephalopathy, fatigue, nausea (reaction to injection)   Nsaids Other (See Comments)    Stomach bleeding episodes; Tylenol is OK   Tomato Hives   Iron (Ferrous Sulfate) [Ferrous Sulfate Er] Other (See Comments)    Shock   Wasp Venom Swelling    Family History  Problem Relation Age of Onset   Cancer Mother        Ovarian   Ovarian cancer Mother    Diabetes Mother    Kidney disease Mother    Bleeding Disorder  Mother    Diabetes type II Sister    Bleeding Disorder Sister    Diabetes Sister    Colon cancer Maternal Grandfather    Crohn's disease Maternal Grandfather    Stomach cancer Maternal Grandmother    Kidney disease Son    Bleeding Disorder Son    Dysmenorrhea Neg Hx      Prior to Admission medications   Medication Sig Start Date End Date Taking? Authorizing Provider  Accu-Chek Softclix Lancets lancets Use to check blood sugar before breakfast and before dinner while on steroids Patient taking differently: 1 each by Other route See admin instructions. Use to check blood sugar before breakfast and before dinner while on steroids 09/27/19   Claudean Severance, MD  ALPRAZolam Prudy Feeler) 0.5 MG tablet TAKE 1 TABLET BY MOUTH AT BEDTIME AS NEEDED FOR ANXIETY OR SLEEP 12/07/22   Malachy Mood, MD  amLODipine (NORVASC) 10 MG tablet TAKE 1 TABLET(10 MG) BY MOUTH DAILY Patient taking differently: Take 10 mg by mouth daily. 06/12/22   Pollyann Samples, NP  citalopram (CELEXA) 20 MG tablet Take 1 tablet (20 mg total) by mouth daily. 06/20/22 06/20/23  Nooruddin, Jason Fila, MD  glucose blood (ACCU-CHEK GUIDE) test strip Check blood sugar 2 times per day while on steroids before breakfast and dinner Patient taking differently: 1 each by Other route See admin instructions. Check blood sugar 2 times per day while on steroids before breakfast and dinner 09/27/19   Claudean Severance, MD  metFORMIN (GLUCOPHAGE) 500 MG tablet TAKE 1 TABLET(500 MG) BY MOUTH TWICE DAILY WITH A MEAL 01/24/23   Nooruddin, Jason Fila, MD  nicotine (NICODERM CQ - DOSED IN MG/24  HOURS) 14 mg/24hr patch Place 1 patch (14 mg total) onto the skin daily. Patient taking differently: Place 14 mg onto the skin daily. 06/20/22 06/20/23  Nooruddin, Jason Fila, MD  pantoprazole (PROTONIX) 40 MG tablet TAKE 1 TABLET(40 MG) BY MOUTH TWICE DAILY Patient taking differently: Take 40 mg by mouth daily. 09/30/22   Malachy Mood, MD  PROAIR HFA 108 [phone] Base) MCG/ACT inhaler INHALE 1 TO 2  PUFFS INTO THE LUNGS EVERY 6 HOURS AS NEEDED FOR WHEEZING OR SHORTNESS OF BREATH Patient taking differently: Inhale 1-2 puffs into the lungs every 6 (six) hours as needed for wheezing or shortness of breath. 11/10/20   Malachy Mood, MD  traZODone (DESYREL) 100 MG tablet TAKE 1 TABLET BY MOUTH AT BEDTIME 12/07/22   Malachy Mood, MD  TYLENOL PM EXTRA STRENGTH 500-25 MG TABS tablet Take 2 tablets by mouth at bedtime as needed (for sleep, knee pain).    [provider]    Physical Exam: BP (!) 166/75   Pulse 60   Temp 98.4 F (36.9 C) (Oral)   Resp 17   SpO2 100%   General:  Alert, oriented, calm, in no acute distress  Eyes: EOMI, clear conjuctivae, white sclerea Neck: supple, no masses, trachea mildline  Cardiovascular: RRR, no murmurs or rubs, no peripheral edema  Respiratory: clear to auscultation bilaterally, no wheezes, no crackles  Abdomen: soft, nontender, nondistended, normal bowel tones heard  Skin: dry, no rashes  Musculoskeletal: no joint effusions, normal range of motion  Psychiatric: appropriate affect, normal speech  Neurologic: extraocular muscles intact, clear speech, moving all extremities with intact sensorium         Labs on Admission:  Basic Metabolic Panel: Recent Labs  Lab 01/31/23 1430  NA 141  K 3.9  CL 112*  CO2 24  GLUCOSE 92  BUN 8  CREATININE 0.88  CALCIUM 8.8*   Liver Function Tests: Recent Labs  Lab 01/31/23 1430  AST 9*  ALT <5  ALKPHOS 60  BILITOT 0.2*  PROT 6.7  ALBUMIN 3.7   No results for input(s): "LIPASE", "AMYLASE" in the last 168 hours. No results for input(s): "AMMONIA" in the last 168 hours. CBC: Recent Labs  Lab 01/31/23 1430  WBC 8.6  NEUTROABS 6.3  HGB 5.6*  HCT 19.4*  MCV 81.2  PLT 213   Cardiac Enzymes: No results for input(s): "CKTOTAL", "CKMB", "CKMBINDEX", "TROPONINI" in the last 168 hours.  BNP (last 3 results) No results for input(s): "BNP" in the last 8760 hours.  ProBNP (last 3 results) No results  for input(s): "PROBNP" in the last 8760 hours.  CBG: No results for input(s): "GLUCAP" in the last 168 hours.  Radiological Exams on Admission: No results found.  Assessment/Plan 62 year old female with a history of hypertension, hereditary hemorrhagic telangiectasia admitted to the hospital with acute blood loss anemia due to recurrent epistaxis.  Acute blood loss anemia-hemodynamically stable, due to recurrent epistaxis.  No signs or symptoms of active bleeding, no signs or symptoms of gastrointestinal blood loss. -Observation admission to progressive unit -Transfused 2 units of blood -Check posttransfusion hemoglobin, as well as CBC in the morning  Hypertension-continue home amlodipine, with IV hydralazine as needed  Type 2 diabetes-non-insulin-dependent -Regular diet (patient requests to not be on carb controlled diet) -Continue home metformin twice daily -Moderate dose sliding scale -Patient informed will need to change to carb controlled diet if hyperglycemic in house  GERD-Protonix  Depression-Celexa  Anxiety-Xanax nightly  DVT prophylaxis: SCDs only  Patient is DNR/DNI-confirmed with  her at the time of admission.  Consults called: None  Admission status: Observation  Time spent: 49 minutes  Jordann Grime Sharlette Dense MD [facility] Pager [phone]  If 7PM-7AM, please contact night-coverage [email or web address] Password [facility]  01/31/2023, 4:47 PM

## 2023-02-01 DIAGNOSIS — R040 Epistaxis: Secondary | ICD-10-CM

## 2023-02-01 DIAGNOSIS — I780 Hereditary hemorrhagic telangiectasia: Secondary | ICD-10-CM

## 2023-02-01 DIAGNOSIS — D62 Acute posthemorrhagic anemia: Secondary | ICD-10-CM | POA: Diagnosis not present

## 2023-02-01 LAB — BASIC METABOLIC PANEL
Anion gap: 10 (ref 5–15)
BUN: 9 mg/dL (ref 8–23)
CO2: 23 mmol/L (ref 22–32)
Calcium: 9.5 mg/dL (ref 8.9–10.3)
Chloride: 106 mmol/L (ref 98–111)
Creatinine, Ser: 0.73 mg/dL (ref 0.44–1.00)
GFR, Estimated: >60 mL/min (ref >60)
Glucose, Bld: 102 mg/dL — ABNORMAL HIGH (ref 70–99)
Potassium: 3.5 mmol/L (ref 3.5–5.1)
Sodium: 139 mmol/L (ref 135–145)

## 2023-02-01 LAB — CBC
HCT: 28.1 % — ABNORMAL LOW (ref 36.0–46.0)
Hemoglobin: 8.4 g/dL — ABNORMAL LOW (ref 12.0–15.0)
MCH: 25.2 pg — ABNORMAL LOW (ref 26.0–34.0)
MCHC: 29.9 g/dL — ABNORMAL LOW (ref 30.0–36.0)
MCV: 84.4 fL (ref 80.0–100.0)
Platelets: 224 10*3/uL (ref 150–400)
RBC: 3.33 MIL/uL — ABNORMAL LOW (ref 3.87–5.11)
RDW: 19.9 % — ABNORMAL HIGH (ref 11.5–15.5)
WBC: 9.6 10*3/uL (ref 4.0–10.5)
nRBC: 0 % (ref 0.0–0.2)

## 2023-02-01 LAB — HEMOGLOBIN A1C
Hgb A1c MFr Bld: 5 % (ref 4.8–5.6)
Mean Plasma Glucose: 96.8 mg/dL

## 2023-02-01 LAB — GLUCOSE, CAPILLARY
Glucose-Capillary: 105 mg/dL — ABNORMAL HIGH (ref 70–99)
Glucose-Capillary: 166 mg/dL — ABNORMAL HIGH (ref 70–99)

## 2023-02-01 LAB — HEMOGLOBIN AND HEMATOCRIT, BLOOD
HCT: 28.8 % — ABNORMAL LOW (ref 36.0–46.0)
Hemoglobin: 8.7 g/dL — ABNORMAL LOW (ref 12.0–15.0)

## 2023-02-01 MED ORDER — CHLORHEXIDINE GLUCONATE CLOTH 2 % EX PADS
6.0000 | MEDICATED_PAD | Freq: Every day | CUTANEOUS | Status: DC
  Administered 2023-02-01: 6 via TOPICAL

## 2023-02-01 MED ORDER — HEPARIN SOD (PORK) LOCK FLUSH 100 UNIT/ML IV SOLN
500.0000 [IU] | Freq: Once | INTRAVENOUS | Status: AC
  Administered 2023-02-01: 500 [IU] via INTRAVENOUS

## 2023-02-01 MED ORDER — SODIUM CHLORIDE 0.9% FLUSH: 10.0000 mL | INTRAVENOUS | Status: DC | PRN

## 2023-02-01 NOTE — Progress Notes (Signed)
Resumed care from morning RN. Agree with assessment. Continuing current plan of care. Patient ambulated with NT in hall way on room air maintaining O2 sats @ 100%.

## 2023-02-01 NOTE — Evaluation (Signed)
Occupational Therapy Evaluation Patient Details Name: Peggy House MRN: [id number] DOB: 1961/01/06 Today's Date: 02/01/2023   History of Present Illness Peggy House is a 62 y.o. 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 Pt had a previous admission 11/16/2022 after an episode of hematemasis containing bright red blood and blood clots and abdominal pain. PMHx: hereditary Hemorrhagic Telangiectasia, blood loss anemia, MDD, GAD, arthritis, HTN, obesity, DM2.   Clinical Impression   Patient evaluated by Occupational Therapy with no further acute OT needs identified. All education has been completed and the patient has no further questions.  See below for any follow-up Occupational Therapy or equipment needs. OT is signing off. Thank you for this referral.        If plan is discharge home, recommend the following: Assistance with cooking/housework;A little help with bathing/dressing/bathroom    Functional Status Assessment  Patient has not had a recent decline in their functional status  Equipment Recommendations  None recommended by OT    Recommendations for Other Services       Precautions / Restrictions Precautions Precautions: Fall Restrictions Weight Bearing Restrictions: No      Mobility Bed Mobility               General bed mobility comments: Received in recliner.    Transfers Overall transfer level: Modified independent Equipment used: Rolling walker (2 wheels)                      Balance Overall balance assessment: Mild deficits observed, not formally tested                                         ADL either performed or assessed with clinical judgement   ADL Overall ADL's : At baseline                                       General ADL  Comments: Pt reports feeling "back to normal since getting that blood". Pt able to demosntrate figure 4 positioning to RT ankle to LT knee for LB dressing, but not LT ankle to RT knee due to LT knee stiffness (awaiting TKA in 8 weeks). Pt remided on uses of her reacher and sock aid for LB dressing and pt verb understanding.  Patient demonstrated ambulation with rolling walker to and from bathroom without assistance, toilet transfer with bedside commode atop toilet to simulate home without assistance and standing grooming at a mod I level.  Patient was given education on proximity to rolling walker and recommendation to use rolling walker rather than furniture surfing in order to offload left knee and improve posture and safety in the home.  Patient educated on walker safety and walker accessories to avoid holding something and mobilizing with a walker.  Patient very receptive and verbalized understanding to all.  Patient demonstrated appropriate use of walker after education completed.     Vision Baseline Vision/History: 1 Wears glasses (reading only) Ability to See in Adequate Light: 0 Adequate Patient Visual Report: Other (comment) ("Cloudy" x 7-8 month. Followed by eye doctor.) Additional Comments: Patient cannot recall exactly but reports she believes she has a cataract in her left  eye.     Perception         Praxis         Pertinent Vitals/Pain Pain Assessment Pain Assessment: No/denies pain (Intermittent knee pain on LT)     Extremity/Trunk Assessment Upper Extremity Assessment Upper Extremity Assessment: RUE deficits/detail;LUE deficits/detail RUE Deficits / Details: Shoulder limitations to ~90 degrees flexion with MMT 4-/5 for range. Able to rotate behind back for hygiene. LUE Deficits / Details: Shoulder limits to ~110 but MMT: [facility]           Communication Communication Communication: No apparent difficulties   Cognition Arousal: Alert Behavior During Therapy: WFL for tasks  assessed/performed Overall Cognitive Status: Within Functional Limits for tasks assessed                                 General Comments: Ox4     General Comments       Exercises Other Exercises Other Exercises: Educated on fall prevention, safe use of rolling walker, walker accessories, use of long handled adaptive equipment to compensate for left knee pain, and home safety.   Shoulder Instructions      Home Living Family/patient expects to be discharged to:: Private residence Living Arrangements: Children Available Help at Discharge: Family;Available PRN/intermittently Type of Home: Apartment Home Access: Level entry Entrance Stairs-Number of Steps: Recenty moved to 1st floor of apartments.   Home Layout: One level     Bathroom Shower/Tub: Sponge bathes at baseline;Tub/shower unit   Bathroom Toilet: Standard (BSC on top)     Home Equipment: Agricultural consultant (2 wheels);BSC/3in1   Additional Comments: lives with son and 73 year old granddaughter      Prior Functioning/Environment Prior Level of Function : Needs assist             Mobility Comments: Intermittently uses RW but usually furniture walks. Denied any falls since previous admission. ADLs Comments: Son helps with groceries, meals and transportation as well as sometimes with donning socks dependon knee pain any given day        OT Problem List: Decreased activity tolerance;Impaired balance (sitting and/or standing);Obesity;Pain      OT Treatment/Interventions:      OT Goals(Current goals can be found in the care plan section) Acute Rehab OT Goals Patient Stated Goal: To go home today OT Goal Formulation: All assessment and education complete, DC therapy Potential to Achieve Goals: Good  OT Frequency:      Co-evaluation              AM-PAC OT "6 Clicks" Daily Activity     Outcome Measure Help from another person eating meals?: None Help from another person taking care of  personal grooming?: None Help from another person toileting, which includes using toliet, bedpan, or urinal?: None Help from another person bathing (including washing, rinsing, drying)?: A Little Help from another person to put on and taking off regular upper body clothing?: None Help from another person to put on and taking off regular lower body clothing?: A Little 6 Click Score: 22   End of Session Equipment Utilized During Treatment: Rolling walker (2 wheels)  Activity Tolerance: Patient tolerated treatment well Patient left: in chair;with call bell/phone within reach  OT Visit Diagnosis: Pain Pain - Right/Left: Left Pain - part of body: Knee (chronic. Plans TKA in 8 weeks)                Time: 1610-9604  OT Time Calculation (min): 18 min Charges:  OT General Charges $OT Visit: 1 Visit OT Evaluation $OT Eval Low Complexity: 1 Low  Lynford Espinoza, OT Acute Rehab Services Office: [phone] 02/01/2023   Theodoro Clock 02/01/2023, 1:31 PM

## 2023-02-01 NOTE — Discharge Summary (Signed)
Physician Discharge Summary   Patient: Peggy House MRN: [id number] DOB: 12-11-1960  Admit date:     01/31/2023  Discharge date: 02/01/23  Discharge Physician: Marguerita Merles, DO   PCP: Olegario Messier, MD   Recommendations at discharge:  {Tip this will not be part of the note when signed- Example include specific recommendations for outpatient follow-up, pending tests to follow-up on. (Optional):26781}  ***  Discharge Diagnoses: Principal Problem:   ABLA (acute blood loss anemia)  Resolved Problems:   * No resolved hospital problems. [facility] Course: No notes on file  Assessment and Plan: No notes have been filed under this hospital service. Service: Hospitalist     {Tip this will not be part of the note when signed Body mass index is 41.59 kg/m. , ,  (Optional):26781}  {(NOTE) Pain control PDMP Statment (Optional):26782} Consultants: *** Procedures performed: ***  Disposition: {Plan; Disposition:26390} Diet recommendation:  {Diet_Plan:26776} DISCHARGE MEDICATION: Allergies as of 02/01/2023       Reactions   Feraheme [ferumoxytol] Other (See Comments)   Muscle tetany, encephalopathy, fatigue, nausea (reaction to injection)   Nsaids Other (See Comments)   Stomach bleeding episodes; Tylenol is OK   Tomato Hives   Iron (ferrous Sulfate) [ferrous Sulfate Er] Other (See Comments)   Shock   Wasp Venom Swelling     Med Rec must be completed prior to using this [facility] An***       Discharge Exam: Filed Weights   01/31/23 1806  Weight: 106.5 kg   ***  Condition at discharge: {DC Condition:26389}  The results of significant diagnostics from this hospitalization (including imaging, microbiology, ancillary and laboratory) are listed below for reference.   Imaging Studies: No results found.  Microbiology: Results for orders placed or performed during the hospital encounter of 09/25/22  Urine Culture     Status: Abnormal   Collection Time:  09/25/22 10:37 PM   Specimen: Urine, Random  Result Value Ref Range Status   Specimen Description URINE, RANDOM  Final   Special Requests NONE Reflexed from [id number]  Final   Culture (A)  Final    >=100,000 COLONIES/mL ESCHERICHIA COLI Two isolates with different morphologies were identified as the same organism.The most resistant organism was reported. Performed at [facility], [address], [place], [place] [address]    Report Status 09/28/2022 FINAL  Final   Organism ID, Bacteria ESCHERICHIA COLI (A)  Final      Susceptibility   Escherichia coli - MIC*    AMPICILLIN >=32 RESISTANT Resistant     CEFAZOLIN <=4 SENSITIVE Sensitive     CEFEPIME <=0.12 SENSITIVE Sensitive     CEFTRIAXONE <=0.25 SENSITIVE Sensitive     CIPROFLOXACIN <=0.25 SENSITIVE Sensitive     GENTAMICIN <=1 SENSITIVE Sensitive     IMIPENEM <=0.25 SENSITIVE Sensitive     NITROFURANTOIN <=16 SENSITIVE Sensitive     TRIMETH/SULFA >=320 RESISTANT Resistant     AMPICILLIN/SULBACTAM 16 INTERMEDIATE Intermediate     PIP/TAZO <=4 SENSITIVE Sensitive     * >=100,000 COLONIES/mL ESCHERICHIA COLI   *Note: Due to a large number of results and/or encounters for the requested time period, some results have not been displayed. A complete set of results can be found in Results Review.    Labs: CBC: Recent Labs  Lab 01/31/23 1430 02/01/23 0528 02/01/23 1003  WBC 8.6 9.6  --   NEUTROABS 6.3  --   --   HGB 5.6* 8.4* 8.7*  HCT 19.4* 28.1* 28.8*  MCV  81.2 84.4  --   PLT 213 224  --    Basic Metabolic Panel: Recent Labs  Lab 01/31/23 1430 02/01/23 0528  NA 141 139  K 3.9 3.5  CL 112* 106  CO2 24 23  GLUCOSE 92 102*  BUN 8 9  CREATININE 0.88 0.73  CALCIUM 8.8* 9.5   Liver Function Tests: Recent Labs  Lab 01/31/23 1430  AST 9*  ALT <5  ALKPHOS 60  BILITOT 0.2*  PROT 6.7  ALBUMIN 3.7   CBG: Recent Labs  Lab 01/31/23 1719 01/31/23 2157 02/01/23 0720 02/01/23 1153  GLUCAP 110* 102* 105* 166*     Discharge time spent: {LESS THAN/GREATER THAN:26388} 30 minutes.  Signed: Merlene Laughter, DO [facility] 02/01/2023

## 2023-02-03 LAB — BPAM RBC
Blood Product Expiration Date: 202411172359
Blood Product Expiration Date: 202411172359
ISSUE DATE / TIME: 202410181842
ISSUE DATE / TIME: 202410182209
Unit Type and Rh: 600
Unit Type and Rh: 600

## 2023-02-03 LAB — TYPE AND SCREEN
ABO/RH(D): A NEG
Antibody Screen: NEGATIVE
Unit division: 0
Unit division: 0

## 2023-02-04 ENCOUNTER — Telehealth: Payer: Self-pay | Admitting: Hematology

## 2023-02-04 NOTE — Hospital Course (Addendum)
HPI per Dr. Teena Dunk on 01/31/23  Peggy House is a 62 y.o. 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  Patient states she has actually been feeling fine for the most part, she typically gets nosebleeds every 2 to 3 days, last nosebleed was 2 days ago, now more severe than usual.  Today she presented to the hematology clinic for her scheduled iron and bevacizumab infusion was noted on labs to have a hemoglobin of 5.6 and was therefore sent to the ER for transfusion and evaluation.  She denies any abdominal pain, hematemesis, dark stools, hematochezia.  Endorses maybe some slight sensation of tiredness but otherwise review of systems is negative.   ED Course: Vital signs in the emergency department are stable and unremarkable other than some hypertension.  CBC and CMP remarkable for hemoglobin 5.6.  ER provider describes brown stool on rectal exam, fecal occult blood negative.  They have ordered 2 unit blood transfusion, and requested hospitalist observation.  **Interim History  She was transfused 2 units of PRBCs and hemoglobin improved.  She had a nosebleed earlier but this resolved spontaneously and easily.  She is hemodynamically stable and had no further recurrent epistaxis except the one episode this morning.  She will need to follow-up with her oncologist given her history of HHT with recurrent epistaxis.  She is medically stable for discharge at this time will need to follow-up with PCP and medical oncology and ENT if necessary.    Assessment and Plan:  Acute Blood Loss Anemia in the setting of epistaxis from history of HHT -Hemodynamically stable, due to recurrent epistaxis.  Had some mild epistaxis this morning but resolved spontaneously and hemoglobin is actually stable -No signs or symptoms of active bleeding, no signs or symptoms of  gastrointestinal blood loss. -Observation admission to progressive unit -Transfused 2 units of blood -Hgb/Hct Trend: Recent Labs  Lab 01/31/23 1430 02/01/23 0528 02/01/23 1003  HGB 5.6* 8.4* 8.7*  HCT 19.4* 28.1* 28.8*  MCV 81.2 84.4  --   -Anemia panel in the outpatient setting and follow-up with oncology within 1 to 2 weeks -Repeat CBC within 1 week and patient does not go to her oncology clinic on Monday   Hypertension -Continue home amlodipine, with IV hydralazine as needed   Type 2 diabetes-non-insulin-dependent -Regular diet (patient requests to not be on carb controlled diet) -Continue home metformin twice daily -Moderate dose sliding scale -HbA1c was 5.0 this visit -Patient informed will need to change to carb controlled diet if hyperglycemic in house -CBG Trend: Recent Labs  Lab 01/31/23 1719 01/31/23 2157 02/01/23 0720 02/01/23 1153  GLUCAP 110* 102* 105* 166*   GERD-Protonix   Depression-Celexa   Anxiety-Xanax nightly  Morbid Obesity -Complicates overall prognosis and care -Estimated body mass index is 41.59 kg/m as calculated from the following:   Height as of this encounter: 5\' 3"  (1.6 m).   Weight as of this encounter: 106.5 kg.  -Weight Loss and Dietary Counseling given

## 2023-02-05 NOTE — Assessment & Plan Note (Addendum)
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

## 2023-02-05 NOTE — Assessment & Plan Note (Signed)
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 -she has required multiple blood transfusions and APCs since 2019. 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 -She had multiple prolonged hospital stay due to severe GI bleeding.   -s/p IR embolization of gastric artery branch vessels on 12/10/17, cauterization of stomach for severe GI bleed in 11/2018, and epinephrine injection for bleeding ulcer on 11/03/20. -She has required frequent blood transfusion, IV iron and bevacizumab -She does respond to treatment, anemia improved with IV iron and bevacizumab, but she is not compliant with her appointments.

## 2023-02-06 ENCOUNTER — Other Ambulatory Visit: Payer: Medicaid Other

## 2023-02-06 ENCOUNTER — Inpatient Hospital Stay: Payer: Medicaid Other

## 2023-02-06 ENCOUNTER — Other Ambulatory Visit: Payer: Self-pay

## 2023-02-06 ENCOUNTER — Inpatient Hospital Stay (HOSPITAL_BASED_OUTPATIENT_CLINIC_OR_DEPARTMENT_OTHER): Payer: Medicaid Other | Admitting: Hematology

## 2023-02-06 ENCOUNTER — Telehealth: Payer: Self-pay

## 2023-02-06 ENCOUNTER — Encounter: Payer: Self-pay | Admitting: Hematology

## 2023-02-06 ENCOUNTER — Ambulatory Visit: Payer: Medicaid Other | Admitting: Nurse Practitioner

## 2023-02-06 ENCOUNTER — Other Ambulatory Visit (HOSPITAL_COMMUNITY): Payer: Self-pay

## 2023-02-06 ENCOUNTER — Ambulatory Visit: Payer: Medicaid Other

## 2023-02-06 VITALS — BP 143/63 | HR 53 | Temp 98.2°F | Resp 18 | Ht 63.0 in | Wt 235.0 lb

## 2023-02-06 DIAGNOSIS — D500 Iron deficiency anemia secondary to blood loss (chronic): Secondary | ICD-10-CM

## 2023-02-06 DIAGNOSIS — Z95828 Presence of other vascular implants and grafts: Secondary | ICD-10-CM

## 2023-02-06 DIAGNOSIS — I780 Hereditary hemorrhagic telangiectasia: Secondary | ICD-10-CM

## 2023-02-06 LAB — CBC WITH DIFFERENTIAL (CANCER CENTER ONLY)
Abs Immature Granulocytes: 0.02 10*3/uL (ref 0.00–0.07)
Basophils Absolute: 0.1 10*3/uL (ref 0.0–0.1)
Basophils Relative: 1 %
Eosinophils Absolute: 0.2 10*3/uL (ref 0.0–0.5)
Eosinophils Relative: 2 %
HCT: 21.4 % — ABNORMAL LOW (ref 36.0–46.0)
Hemoglobin: 6.2 g/dL — CL (ref 12.0–15.0)
Immature Granulocytes: 0 %
Lymphocytes Relative: 15 %
Lymphs Abs: 1.2 10*3/uL (ref 0.7–4.0)
MCH: 24.4 pg — ABNORMAL LOW (ref 26.0–34.0)
MCHC: 29 g/dL — ABNORMAL LOW (ref 30.0–36.0)
MCV: 84.3 fL (ref 80.0–100.0)
Monocytes Absolute: 0.4 10*3/uL (ref 0.1–1.0)
Monocytes Relative: 5 %
Neutro Abs: 6.3 10*3/uL (ref 1.7–7.7)
Neutrophils Relative %: 77 %
Platelet Count: 238 10*3/uL (ref 150–400)
RBC: 2.54 MIL/uL — ABNORMAL LOW (ref 3.87–5.11)
RDW: 21.4 % — ABNORMAL HIGH (ref 11.5–15.5)
WBC Count: 8.2 10*3/uL (ref 4.0–10.5)
nRBC: 0 % (ref 0.0–0.2)

## 2023-02-06 LAB — CMP (CANCER CENTER ONLY)
ALT: 8 U/L (ref 0–44)
AST: 13 U/L — ABNORMAL LOW (ref 15–41)
Albumin: 3.4 g/dL — ABNORMAL LOW (ref 3.5–5.0)
Alkaline Phosphatase: 58 U/L (ref 38–126)
Anion gap: 7 (ref 5–15)
BUN: 9 mg/dL (ref 8–23)
CO2: 22 mmol/L (ref 22–32)
Calcium: 8.5 mg/dL — ABNORMAL LOW (ref 8.9–10.3)
Chloride: 112 mmol/L — ABNORMAL HIGH (ref 98–111)
Creatinine: 0.92 mg/dL (ref 0.44–1.00)
GFR, Estimated: >60 mL/min (ref >60)
Glucose, Bld: 96 mg/dL (ref 70–99)
Potassium: 3.5 mmol/L (ref 3.5–5.1)
Sodium: 141 mmol/L (ref 135–145)
Total Bilirubin: 0.4 mg/dL (ref 0.3–1.2)
Total Protein: 6.7 g/dL (ref 6.5–8.1)

## 2023-02-06 LAB — SAMPLE TO BLOOD BANK

## 2023-02-06 LAB — PREPARE RBC (CROSSMATCH)

## 2023-02-06 MED ORDER — SODIUM CHLORIDE 0.9 % IV SOLN
250.0000 mg | Freq: Once | INTRAVENOUS | Status: AC
  Administered 2023-02-06: 250 mg via INTRAVENOUS
  Filled 2023-02-06: qty 20

## 2023-02-06 MED ORDER — AMINOCAPROIC ACID 500 MG PO TABS
1000.0000 mg | ORAL_TABLET | Freq: Two times a day (BID) | ORAL | 3 refills | Status: DC
  Filled 2023-02-06: qty 120, 30d supply, fill #0

## 2023-02-06 MED ORDER — SODIUM CHLORIDE 0.9 % IV SOLN: Freq: Once | INTRAVENOUS | Status: AC

## 2023-02-06 MED ORDER — BEVACIZUMAB-ADCD CHEMO INJECTION 400 MG/16ML
5.0000 mg/kg | Freq: Once | INTRAVENOUS | Status: AC
  Administered 2023-02-06: 500 mg via INTRAVENOUS
  Filled 2023-02-06: qty 4

## 2023-02-06 MED ORDER — HEPARIN SOD (PORK) LOCK FLUSH 100 UNIT/ML IV SOLN
500.0000 [IU] | Freq: Once | INTRAVENOUS | Status: AC | PRN
  Administered 2023-02-06: 500 [IU]

## 2023-02-06 MED ORDER — SODIUM CHLORIDE 0.9% FLUSH
10.0000 mL | INTRAVENOUS | Status: DC | PRN
  Administered 2023-02-06: 10 mL

## 2023-02-06 MED ORDER — SODIUM CHLORIDE 0.9% FLUSH
10.0000 mL | Freq: Once | INTRAVENOUS | Status: AC
  Administered 2023-02-06: 10 mL

## 2023-02-06 NOTE — Progress Notes (Signed)
Verbal order given to administer 2 units of PRBC from Dr. Mosetta Putt, spoke with Tresa Endo in the blood bank type and screen order received patient will have 2 units of PRBC transfused on 02/08/2023.

## 2023-02-06 NOTE — Telephone Encounter (Signed)
CRITICAL VALUE STICKER  CRITICAL VALUE: Hgb 6.2  RECEIVER (on-site recipient of call): Sharlette Dense CMA  DATE & TIME NOTIFIED: 02/06/2023 1100  MESSENGER (representative from lab): Lanora Manis in lab  MD NOTIFIED: Dr Mosetta Putt  TIME OF NOTIFICATION: 1102  RESPONSE: Made Dr Mosetta Putt aware and patient getting treatment

## 2023-02-06 NOTE — Patient Instructions (Signed)
[facility]  Discharge Instructions: Thank you for choosing [facility] to provide your oncology and hematology care.   If you have a lab appointment with the Cancer Center, please go directly to the Cancer Center and check in at the registration area.   Wear comfortable clothing and clothing appropriate for easy access to any Portacath or PICC line.   We strive to give you quality time with your provider. You may need to reschedule your appointment if you arrive late (15 or more minutes).  Arriving late affects you and other patients whose appointments are after yours.  Also, if you miss three or more appointments without notifying the office, you may be dismissed from the clinic at the provider's discretion.      For prescription refill requests, have your pharmacy contact our office and allow 72 hours for refills to be completed.    Today you received the following chemotherapy and/or immunotherapy agents: Bevacizumab      To help prevent nausea and vomiting after your treatment, we encourage you to take your nausea medication as directed.  BELOW ARE SYMPTOMS THAT SHOULD BE REPORTED IMMEDIATELY: *FEVER GREATER THAN 100.4 F (38 C) OR HIGHER *CHILLS OR SWEATING *NAUSEA AND VOMITING THAT IS NOT CONTROLLED WITH YOUR NAUSEA MEDICATION *UNUSUAL SHORTNESS OF BREATH *UNUSUAL BRUISING OR BLEEDING *URINARY PROBLEMS (pain or burning when urinating, or frequent urination) *BOWEL PROBLEMS (unusual diarrhea, constipation, pain near the anus) TENDERNESS IN MOUTH AND THROAT WITH OR WITHOUT PRESENCE OF ULCERS (sore throat, sores in mouth, or a toothache) UNUSUAL RASH, SWELLING OR PAIN  UNUSUAL VAGINAL DISCHARGE OR ITCHING   Items with * indicate a potential emergency and should be followed up as soon as possible or go to the Emergency Department if any problems should occur.  Please show the CHEMOTHERAPY ALERT CARD or IMMUNOTHERAPY ALERT CARD at  check-in to the Emergency Department and triage nurse.  Should you have questions after your visit or need to cancel or reschedule your appointment, please contact [facility]  Dept: [phone]  and follow the prompts.  Office hours are 8:00 a.m. to 4:30 p.m. Monday - Friday. Please note that voicemails left after 4:00 p.m. may not be returned until the following business day.  We are closed weekends and major holidays. You have access to a nurse at all times for urgent questions. Please call the main number to the clinic Dept: [phone] and follow the prompts.   For any non-urgent questions, you may also contact your provider using MyChart. We now offer e-Visits for anyone 70 and older to request care online for non-urgent symptoms. For details visit [email or web address].   Also download the MyChart app! Go to the app store, search "MyChart", open the app, select [place], and log in with your MyChart username and password.  Sodium Ferric Gluconate Complex Injection What is this medication? SODIUM FERRIC GLUCONATE COMPLEX (SOE dee um FER ik GLOO koe nate KOM pleks) treats low levels of iron (iron deficiency anemia) in people with kidney disease. Iron is a mineral that plays an important role in making red blood cells, which carry oxygen from your lungs to the rest of your body. This medicine may be used for other purposes; ask your health care provider or pharmacist if you have questions. COMMON BRAND NAME(S): Ferrlecit, Nulecit What should I tell my care team before I take this medication? They need to know if you have any  of the following conditions: Anemia that is not from iron deficiency High levels of iron in the blood An unusual or allergic reaction to iron, other medications, foods, dyes, or preservatives Pregnant or are trying to become pregnant Breast-feeding How should I use this medication? This medication is injected into a vein. It  is given by your care team in a hospital or clinic setting. Talk to your care team about the use of this medication in children. While it may be prescribed for children as young as 6 years for selected conditions, precautions do apply. Overdosage: If you think you have taken too much of this medicine contact a poison control center or emergency room at once. NOTE: This medicine is only for you. Do not share this medicine with others. What if I miss a dose? It is important not to miss your dose. Call your care team if you are unable to keep an appointment. What may interact with this medication? Do not take this medication with any of the following: Deferasirox Deferoxamine Dimercaprol This medication may also interact with the following: Other iron products This list may not describe all possible interactions. Give your health care provider a list of all the medicines, herbs, non-prescription drugs, or dietary supplements you use. Also tell them if you smoke, drink alcohol, or use illegal drugs. Some items may interact with your medicine. What should I watch for while using this medication? Your condition will be monitored carefully while you are receiving this medication. Visit your care team for regular checks on your progress. You may need blood work while you are taking this medication. What side effects may I notice from receiving this medication? Side effects that you should report to your care team as soon as possible: Allergic reactions--skin rash, itching, hives, swelling of the face, lips, tongue, or throat Low blood pressure--dizziness, feeling faint or lightheaded, blurry vision Shortness of breath Side effects that usually do not require medical attention (report to your care team if they continue or are bothersome): Flushing Headache Joint pain Muscle pain Nausea Pain, redness, or irritation at injection site This list may not describe all possible side effects. Call your  doctor for medical advice about side effects. You may report side effects to FDA at 1-800-FDA-1088. Where should I keep my medication? This medication is given in a hospital or clinic and will not be stored at home. NOTE: This sheet is a summary. It may not cover all possible information. If you have questions about this medicine, talk to your doctor, pharmacist, or health care provider.  2024 Elsevier/Gold Standard (2020-08-25 00:00:00)

## 2023-02-06 NOTE — Progress Notes (Signed)
Per Dr. Mosetta Putt ok to treat patient with HGB 6.2 and w/o urine protein.

## 2023-02-06 NOTE — Progress Notes (Signed)
[facility]   Telephone:([phone] Fax:([phone]   Clinic Follow up Note   Patient Care Team: Olegario Messier, MD as PCP - Serita Grit, MD as Attending Physician (Hematology and Oncology)  Date of Service:  02/06/2023  CHIEF COMPLAINT: f/u of anemia   CURRENT THERAPY:  -Blood transfusion for Hg<8.0 (2units on different days if Hg<7) -iv ferric gluconate, with ferritin goal 100-200.  -Avastin/Zirabev restarted on 04/03/18. 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 -she has required multiple blood transfusions and APCs since 2019. 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 -She had multiple prolonged hospital stay due to severe GI bleeding.   -s/p IR embolization of gastric artery branch vessels on 12/10/17, cauterization of stomach for severe GI bleed in 11/2018, and epinephrine injection for bleeding ulcer on 11/03/20. -She has required frequent blood transfusion, IV iron and bevacizumab -She does respond to treatment, anemia improved with IV iron and bevacizumab, but she is not compliant with her appointments.  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 Non-compliance with appointments and medication (Amicar) contributing to increased bleeding and hospital admission. -Resume Amica twice daily. -Weekly  appointments for blood count checks and IV iron. -Attempt to schedule blood transfusion for 02/08/2023. -Communicate with orthopedic surgeon regarding potential knee replacement surgery, emphasizing the need for improved blood counts prior to surgery.  Iron Deficiency Anemia Secondary to HHT and recurrent epistaxis. Recent hospital admission due to severe anemia. -Weekly appointments for blood count checks and IV iron. -Attempt to schedule blood transfusion for 02/08/2023.  General Health Maintenance -Declined flu shot.     Plan -Will proceed bevacizumab today and continue every 2 weeks -Will proceed to IV iron today and continue weekly -2 units of blood transfusion in the next few days, and I will schedule blood transfusion every 2 weeks -Follow-up in 8 weeks -We again had a long conversation of her need for long-term treatment and adherence.    Discussed the use of AI scribe software for clinical note transcription with the patient, who gave verbal consent to proceed.  History of Present Illness   A 62 year old patient with a history of Hereditary Hemorrhagic Telangiectasia (HHT) and iron deficiency anemia presents for a follow-up visit. The patient reports feeling fatigued and had a recent hospital admission due to low blood counts. She has missed several appointments, which has resulted in a gap in her chemotherapy treatment. The patient reports experiencing nosebleeds, which she attributes to the delay in her chemotherapy. She also mentions a recent hospital stay of one day. The patient is scheduled for a knee replacement surgery due to an orthopedic issue.         All other systems were reviewed with the patient and are negative.  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    SURGICAL HISTORY: Past Surgical History:  Procedure Laterality Date   ABDOMINAL HYSTERECTOMY     CARPAL TUNNEL RELEASE  05/13/2011   Procedure: CARPAL TUNNEL RELEASE;  Surgeon: Mable Paris, MD;  Location: [facility];  Service: Orthopedics;  Laterality: Left;   COLONOSCOPY N/A 03/02/2020   Procedure: COLONOSCOPY;  Surgeon: Bernette Redbird, MD;  Location: [facility] ENDOSCOPY;  Service: Endoscopy;  Laterality: N/A;   COLONOSCOPY WITH PROPOFOL N/A 04/28/2014   Procedure: COLONOSCOPY WITH PROPOFOL;  Surgeon: Florencia Reasons, MD;  Location: [facility] ENDOSCOPY;  Service: Endoscopy;  Laterality: N/A;   DG TOES*L*  2/10   rt   DILATION AND CURETTAGE OF UTERUS     ENTEROSCOPY N/A 10/17/2017   Procedure: ENTEROSCOPY;  Surgeon: Kathi Der, MD;  Location: [facility] ENDOSCOPY;  Service: Gastroenterology;  Laterality: N/A;   ESOPHAGOGASTRODUODENOSCOPY N/A 04/10/2014   Procedure: ESOPHAGOGASTRODUODENOSCOPY (EGD);  Surgeon: Shirley Friar, MD;  Location: [facility] ENDOSCOPY;  Service: Endoscopy;  Laterality: N/A;   ESOPHAGOGASTRODUODENOSCOPY N/A 05/10/2017   Procedure: ESOPHAGOGASTRODUODENOSCOPY (EGD);  Surgeon: Bernette Redbird, MD;  Location: [facility] ENDOSCOPY;  Service: Endoscopy;  Laterality: N/A;   ESOPHAGOGASTRODUODENOSCOPY N/A 09/22/2017   Procedure: ESOPHAGOGASTRODUODENOSCOPY (EGD);  Surgeon: Vida Rigger, MD;  Location: [facility] ENDOSCOPY;  Service: Endoscopy;  Laterality: N/A;  bedside   ESOPHAGOGASTRODUODENOSCOPY N/A 03/02/2020   Procedure: ESOPHAGOGASTRODUODENOSCOPY (EGD);  Surgeon: Bernette Redbird, MD;  Location: [facility]OSCOPY;  Service: Endoscopy;  Laterality: N/A;   ESOPHAGOGASTRODUODENOSCOPY N/A 11/03/2020   Procedure: ESOPHAGOGASTRODUODENOSCOPY (EGD);  Surgeon: Charlott Rakes, MD;  Location: [facility] ENDOSCOPY;  Service: Endoscopy;   Laterality: N/A;   ESOPHAGOGASTRODUODENOSCOPY N/A 04/12/2022   Procedure: ESOPHAGOGASTRODUODENOSCOPY (EGD);  Surgeon: Vida Rigger, MD;  Location: [facility] ENDOSCOPY;  Service: Gastroenterology;  Laterality: N/A;   ESOPHAGOGASTRODUODENOSCOPY N/A 05/27/2022   Procedure: ESOPHAGOGASTRODUODENOSCOPY (EGD);  Surgeon: Willis Modena, MD;  Location: [facility] ENDOSCOPY;  Service: Gastroenterology;  Laterality: N/A;   ESOPHAGOGASTRODUODENOSCOPY N/A 09/27/2022   Procedure: ESOPHAGOGASTRODUODENOSCOPY (EGD);  Surgeon: Lynann Bologna, DO;  Location: [facility] ENDOSCOPY;  Service: Gastroenterology;  Laterality: N/A;   ESOPHAGOGASTRODUODENOSCOPY (EGD) WITH PROPOFOL N/A 04/27/2014   Procedure: ESOPHAGOGASTRODUODENOSCOPY (EGD) WITH PROPOFOL;  Surgeon: Florencia Reasons, MD;  Location: [facility] ENDOSCOPY;  Service: Endoscopy;  Laterality: N/A;  possible apc   ESOPHAGOGASTRODUODENOSCOPY (EGD) WITH PROPOFOL N/A 09/30/2017   Procedure: ESOPHAGOGASTRODUODENOSCOPY (EGD) WITH PROPOFOL;  Surgeon: Kerin Salen, MD;  Location: [facility] ENDOSCOPY;  Service: Gastroenterology;  Laterality: N/A;   ESOPHAGOGASTRODUODENOSCOPY (EGD) WITH PROPOFOL N/A 10/01/2017   Procedure: ESOPHAGOGASTRODUODENOSCOPY (EGD) WITH PROPOFOL;  Surgeon: Kerin Salen, MD;  Location: [facility] ENDOSCOPY;  Service: Gastroenterology;  Laterality: N/A;   ESOPHAGOGASTRODUODENOSCOPY (EGD) WITH PROPOFOL N/A 10/08/2017   Procedure: ESOPHAGOGASTRODUODENOSCOPY (EGD) WITH PROPOFOL;  Surgeon: Kathi Der, MD;  Location: [facility] ENDOSCOPY;  Service: Gastroenterology;  Laterality: N/A;   ESOPHAGOGASTRODUODENOSCOPY (EGD) WITH PROPOFOL N/A 10/17/2017   Procedure: ESOPHAGOGASTRODUODENOSCOPY (EGD) WITH PROPOFOL;  Surgeon: Kathi Der, MD;  Location: [facility] ENDOSCOPY;  Service: Gastroenterology;  Laterality: N/A;   ESOPHAGOGASTRODUODENOSCOPY (EGD) WITH PROPOFOL N/A 10/19/2017   Procedure: ESOPHAGOGASTRODUODENOSCOPY (EGD) WITH PROPOFOL;  Surgeon: Kathi Der, MD;  Location: [facility] ENDOSCOPY;  Service:  Gastroenterology;  Laterality: N/A;   ESOPHAGOGASTRODUODENOSCOPY (EGD) WITH PROPOFOL N/A 12/04/2018  Procedure: ESOPHAGOGASTRODUODENOSCOPY (EGD) WITH PROPOFOL;  Surgeon: Charlott Rakes, MD;  Location: [facility] ENDOSCOPY;  Service: Endoscopy;  Laterality: N/A;   GIVENS CAPSULE STUDY N/A 10/02/2017   Procedure: GIVENS CAPSULE STUDY;  Surgeon: Kerin Salen, MD;  Location: [facility] ENDOSCOPY;  Service: Gastroenterology;  Laterality: N/A;   GIVENS CAPSULE STUDY N/A 10/08/2017   Procedure: GIVENS CAPSULE STUDY;  Surgeon: Kathi Der, MD;  Location: [facility] ENDOSCOPY;  Service: Gastroenterology;  Laterality: N/A;  endoscopic placement of capsule   GIVENS CAPSULE STUDY N/A 03/02/2020   Procedure: GIVENS CAPSULE STUDY;  Surgeon: Bernette Redbird, MD;  Location: [facility] ENDOSCOPY;  Service: Endoscopy;  Laterality: N/A;   HEMOSTASIS CLIP PLACEMENT  12/04/2018   Procedure: HEMOSTASIS CLIP PLACEMENT;  Surgeon: Charlott Rakes, MD;  Location: [facility] ENDOSCOPY;  Service: Endoscopy;;   HEMOSTASIS CLIP PLACEMENT  05/27/2022   Procedure: HEMOSTASIS CLIP PLACEMENT;  Surgeon: Willis Modena, MD;  Location: [facility] ENDOSCOPY;  Service: Gastroenterology;;   HEMOSTASIS CLIP PLACEMENT  09/27/2022   Procedure: HEMOSTASIS CLIP PLACEMENT;  Surgeon: Lynann Bologna, DO;  Location: [facility] ENDOSCOPY;  Service: Gastroenterology;;   HEMOSTASIS CONTROL  05/27/2022   Procedure: HEMOSTASIS CONTROL;  Surgeon: Willis Modena, MD;  Location: [facility] ENDOSCOPY;  Service: Gastroenterology;;   HOT HEMOSTASIS N/A 04/27/2014   Procedure: HOT HEMOSTASIS (ARGON PLASMA COAGULATION/BICAP);  Surgeon: Florencia Reasons, MD;  Location: [facility] ENDOSCOPY;  Service: Endoscopy;  Laterality: N/A;   HOT HEMOSTASIS N/A 09/30/2017   Procedure: HOT HEMOSTASIS (ARGON PLASMA COAGULATION/BICAP);  Surgeon: Kerin Salen, MD;  Location: [facility] ENDOSCOPY;  Service: Gastroenterology;  Laterality: N/A;   HOT HEMOSTASIS N/A 10/01/2017   Procedure: HOT HEMOSTASIS (ARGON PLASMA COAGULATION/BICAP);  Surgeon:  Kerin Salen, MD;  Location: [facility] ENDOSCOPY;  Service: Gastroenterology;  Laterality: N/A;   HOT HEMOSTASIS N/A 10/17/2017   Procedure: HOT HEMOSTASIS (ARGON PLASMA COAGULATION/BICAP);  Surgeon: Kathi Der, MD;  Location: [facility] ENDOSCOPY;  Service: Gastroenterology;  Laterality: N/A;   HOT HEMOSTASIS N/A 10/19/2017   Procedure: HOT HEMOSTASIS (ARGON PLASMA COAGULATION/BICAP);  Surgeon: Kathi Der, MD;  Location: [facility] ENDOSCOPY;  Service: Gastroenterology;  Laterality: N/A;   HOT HEMOSTASIS N/A 03/02/2020   Procedure: HOT HEMOSTASIS (ARGON PLASMA COAGULATION/BICAP);  Surgeon: Bernette Redbird, MD;  Location: [facility] ENDOSCOPY;  Service: Endoscopy;  Laterality: N/A;   HOT HEMOSTASIS N/A 04/12/2022   Procedure: HOT HEMOSTASIS (ARGON PLASMA COAGULATION/BICAP);  Surgeon: Vida Rigger, MD;  Location: [facility] ENDOSCOPY;  Service: Gastroenterology;  Laterality: N/A;   IR IMAGING GUIDED PORT INSERTION  07/08/2018   L shoulder Surgery  2011   POLYPECTOMY  03/02/2020   Procedure: POLYPECTOMY;  Surgeon: Bernette Redbird, MD;  Location: [facility] ENDOSCOPY;  Service: Endoscopy;;   SCLEROTHERAPY  11/03/2020   Procedure: Susa Day;  Surgeon: Charlott Rakes, MD;  Location: [facility] ENDOSCOPY;  Service: Endoscopy;;   SUBMUCOSAL INJECTION  09/22/2017   Procedure: SUBMUCOSAL INJECTION;  Surgeon: Vida Rigger, MD;  Location: [facility] ENDOSCOPY;  Service: Endoscopy;;   SUBMUCOSAL INJECTION  12/04/2018   Procedure: SUBMUCOSAL INJECTION;  Surgeon: Charlott Rakes, MD;  Location: [facility] ENDOSCOPY;  Service: Endoscopy;;    I have reviewed the social history and family history with the patient and they are unchanged from previous note.  ALLERGIES:  is allergic to feraheme [ferumoxytol], nsaids, tomato, iron (ferrous sulfate) [ferrous sulfate er], and wasp venom.  MEDICATIONS:  Current Outpatient Medications  Medication Sig Dispense Refill   aminocaproic acid (AMICAR) 500 MG tablet Take 2 tablets (1,000 mg total) by mouth every 12 (twelve)  hours. 120 tablet 3   Accu-Chek Softclix Lancets lancets Use  to check blood sugar before breakfast and before dinner while on steroids (Patient taking differently: 1 each by Other route See admin instructions. Use to check blood sugar before breakfast and before dinner while on steroids) 100 each 1   ALPRAZolam (XANAX) 0.5 MG tablet TAKE 1 TABLET BY MOUTH AT BEDTIME AS NEEDED FOR ANXIETY OR SLEEP 7 tablet 0   amLODipine (NORVASC) 10 MG tablet TAKE 1 TABLET(10 MG) BY MOUTH DAILY (Patient taking differently: Take 10 mg by mouth daily.) 90 tablet 3   citalopram (CELEXA) 20 MG tablet Take 1 tablet (20 mg total) by mouth daily. 90 tablet 3   glucose blood (ACCU-CHEK GUIDE) test strip Check blood sugar 2 times per day while on steroids before breakfast and dinner (Patient taking differently: 1 each by Other route See admin instructions. Check blood sugar 2 times per day while on steroids before breakfast and dinner) 50 each 3   lisinopril (ZESTRIL) 20 MG tablet Take 20 mg by mouth daily.     metFORMIN (GLUCOPHAGE) 500 MG tablet TAKE 1 TABLET(500 MG) BY MOUTH TWICE DAILY WITH A MEAL 180 tablet 3   nicotine (NICODERM CQ - DOSED IN MG/24 HOURS) 14 mg/24hr patch Place 1 patch (14 mg total) onto the skin daily. (Patient taking differently: Place 14 mg onto the skin daily.) 30 patch 3   pantoprazole (PROTONIX) 40 MG tablet TAKE 1 TABLET(40 MG) BY MOUTH TWICE DAILY (Patient taking differently: Take 40 mg by mouth daily.) 180 tablet 1   PROAIR HFA 108 (90 Base) MCG/ACT inhaler INHALE 1 TO 2 PUFFS INTO THE LUNGS EVERY 6 HOURS AS NEEDED FOR WHEEZING OR SHORTNESS OF BREATH (Patient taking differently: Inhale 1-2 puffs into the lungs every 6 (six) hours as needed for wheezing or shortness of breath.) 8.5 g 0   traZODone (DESYREL) 100 MG tablet TAKE 1 TABLET BY MOUTH AT BEDTIME 7 tablet 0   TYLENOL PM EXTRA STRENGTH 500-25 MG TABS tablet Take 2 tablets by mouth at bedtime as needed (for sleep, knee pain).     No current  facility-administered medications for this visit.   Facility-Administered Medications Ordered in Other Visits  Medication Dose Route Frequency Provider Last Rate Last Admin   bevacizumab-adcd (VEGZELMA) 500 mg in sodium chloride 0.9 % 100 mL chemo infusion  5 mg/kg (Treatment Plan Recorded) Intravenous Once Malachy Mood, MD       diphenhydrAMINE (BENADRYL) 25 mg capsule            diphenhydrAMINE (BENADRYL) 25 mg capsule            ferric gluconate (FERRLECIT) 250 mg in sodium chloride 0.9 % 250 mL IVPB  250 mg Intravenous Once Malachy Mood, MD       heparin lock flush 100 unit/mL  500 Units Intracatheter Once PRN Malachy Mood, MD       phenylephrine (NEO-SYNEPHRINE) 1 % nasal drops 2 drop  2 drop Left Nare Once Malachy Mood, MD       sodium chloride flush (NS) 0.9 % injection 10 mL  10 mL Intracatheter PRN Malachy Mood, MD        PHYSICAL EXAMINATION: ECOG PERFORMANCE STATUS: 2 - Symptomatic, <50% confined to bed  Vitals:   02/06/23 1039  BP: (!) 143/63  Pulse: (!) 53  Resp: 18  Temp: 98.2 F (36.8 C)  SpO2: 99%   Wt Readings from Last 3 Encounters:  02/06/23 235 lb (106.6 kg)  01/31/23 234 lb 12.6 oz (106.5 kg)  01/31/23 232 lb  12.8 oz (105.6 kg)     GENERAL:alert, no distress and comfortable SKIN: skin color, texture, turgor are normal, no rashes or significant lesions EYES: normal, Conjunctiva are pink and non-injected, sclera clear Musculoskeletal:no cyanosis of digits and no clubbing, no leg edema NEURO: alert & oriented x 3 with fluent speech, no focal motor/sensory deficits   LABORATORY DATA:  I have reviewed the data as listed    Latest Ref Rng & Units 02/06/2023   10:12 AM 02/01/2023   10:03 AM 02/01/2023    5:28 AM  CBC  WBC 4.0 - 10.5 K/uL 8.2   9.6   Hemoglobin 12.0 - 15.0 g/dL 6.2  8.7  8.4   Hematocrit 36.0 - 46.0 % 21.4  28.8  28.1   Platelets 150 - 400 K/uL 238   224         Latest Ref Rng & Units 02/06/2023   10:12 AM 02/01/2023    5:28 AM 01/31/2023     2:30 PM  CMP  Glucose 70 - 99 mg/dL 96  409  92   BUN 8 - 23 mg/dL 9  9  8    Creatinine 0.44 - 1.00 mg/dL 8.11  9.14  7.82   Sodium 135 - 145 mmol/L 141  139  141   Potassium 3.5 - 5.1 mmol/L 3.5  3.5  3.9   Chloride 98 - 111 mmol/L 112  106  112   CO2 22 - 32 mmol/L 22  23  24    Calcium 8.9 - 10.3 mg/dL 8.5  9.5  8.8   Total Protein 6.5 - 8.1 g/dL 6.7   6.7   Total Bilirubin 0.3 - 1.2 mg/dL 0.4   0.2   Alkaline Phos 38 - 126 U/L 58   60   AST 15 - 41 U/L 13   9   ALT 0 - 44 U/L 8   <5       RADIOGRAPHIC STUDIES: I have personally reviewed the radiological images as listed and agreed with the findings in the report. No results found.    Orders Placed This Encounter  Procedures   Total Protein, Urine dipstick    Standing Status:   Standing    Number of Occurrences:   20    Standing Expiration Date:   02/06/2024   All questions were answered. The patient knows to call the clinic with any problems, questions or concerns. No barriers to learning was detected. The total time spent in the appointment was 25 minutes.     Malachy Mood, MD 02/06/2023

## 2023-02-07 ENCOUNTER — Other Ambulatory Visit: Payer: Self-pay

## 2023-02-08 ENCOUNTER — Inpatient Hospital Stay: Payer: Medicaid Other

## 2023-02-10 LAB — TYPE AND SCREEN
ABO/RH(D): A NEG
Antibody Screen: NEGATIVE
Unit division: 0
Unit division: 0

## 2023-02-10 LAB — BPAM RBC
Blood Product Expiration Date: 202411202359
Blood Product Expiration Date: 202411212359
Unit Type and Rh: 600
Unit Type and Rh: 600

## 2023-02-12 ENCOUNTER — Telehealth: Payer: Self-pay | Admitting: Hematology

## 2023-02-14 ENCOUNTER — Other Ambulatory Visit: Payer: Self-pay

## 2023-02-14 ENCOUNTER — Inpatient Hospital Stay: Payer: Medicaid Other

## 2023-02-14 ENCOUNTER — Inpatient Hospital Stay: Payer: Medicaid Other | Attending: Hematology

## 2023-02-14 VITALS — BP 153/51 | HR 68 | Temp 98.8°F | Resp 18

## 2023-02-14 DIAGNOSIS — Z95828 Presence of other vascular implants and grafts: Secondary | ICD-10-CM

## 2023-02-14 DIAGNOSIS — I780 Hereditary hemorrhagic telangiectasia: Secondary | ICD-10-CM

## 2023-02-14 DIAGNOSIS — D500 Iron deficiency anemia secondary to blood loss (chronic): Secondary | ICD-10-CM | POA: Insufficient documentation

## 2023-02-14 LAB — CBC WITH DIFFERENTIAL (CANCER CENTER ONLY)
Abs Immature Granulocytes: 0.03 10*3/uL (ref 0.00–0.07)
Basophils Absolute: 0 10*3/uL (ref 0.0–0.1)
Basophils Relative: 0 %
Eosinophils Absolute: 0.1 10*3/uL (ref 0.0–0.5)
Eosinophils Relative: 1 %
HCT: 19.6 % — ABNORMAL LOW (ref 36.0–46.0)
Hemoglobin: 5.8 g/dL — CL (ref 12.0–15.0)
Immature Granulocytes: 0 %
Lymphocytes Relative: 9 %
Lymphs Abs: 1 10*3/uL (ref 0.7–4.0)
MCH: 25.1 pg — ABNORMAL LOW (ref 26.0–34.0)
MCHC: 29.6 g/dL — ABNORMAL LOW (ref 30.0–36.0)
MCV: 84.8 fL (ref 80.0–100.0)
Monocytes Absolute: 0.4 10*3/uL (ref 0.1–1.0)
Monocytes Relative: 4 %
Neutro Abs: 8.8 10*3/uL — ABNORMAL HIGH (ref 1.7–7.7)
Neutrophils Relative %: 86 %
Platelet Count: 309 10*3/uL (ref 150–400)
RBC: 2.31 MIL/uL — ABNORMAL LOW (ref 3.87–5.11)
RDW: 23.1 % — ABNORMAL HIGH (ref 11.5–15.5)
WBC Count: 10.4 10*3/uL (ref 4.0–10.5)
nRBC: 0 % (ref 0.0–0.2)

## 2023-02-14 LAB — CMP (CANCER CENTER ONLY)
ALT: <5 U/L (ref 0–44)
AST: 10 U/L — ABNORMAL LOW (ref 15–41)
Albumin: 3.7 g/dL (ref 3.5–5.0)
Alkaline Phosphatase: 61 U/L (ref 38–126)
Anion gap: 6 (ref 5–15)
BUN: 6 mg/dL — ABNORMAL LOW (ref 8–23)
CO2: 25 mmol/L (ref 22–32)
Calcium: 8.8 mg/dL — ABNORMAL LOW (ref 8.9–10.3)
Chloride: 111 mmol/L (ref 98–111)
Creatinine: 0.8 mg/dL (ref 0.44–1.00)
GFR, Estimated: >60 mL/min (ref >60)
Glucose, Bld: 117 mg/dL — ABNORMAL HIGH (ref 70–99)
Potassium: 3.5 mmol/L (ref 3.5–5.1)
Sodium: 142 mmol/L (ref 135–145)
Total Bilirubin: 0.3 mg/dL (ref 0.3–1.2)
Total Protein: 6.6 g/dL (ref 6.5–8.1)

## 2023-02-14 LAB — SAMPLE TO BLOOD BANK

## 2023-02-14 LAB — TOTAL PROTEIN, URINE DIPSTICK: Protein, ur: 100 mg/dL — AB

## 2023-02-14 LAB — PREPARE RBC (CROSSMATCH)

## 2023-02-14 LAB — FERRITIN: Ferritin: 29 ng/mL (ref 11–307)

## 2023-02-14 MED ORDER — SODIUM CHLORIDE 0.9% FLUSH
10.0000 mL | Freq: Once | INTRAVENOUS | Status: DC
Start: 2023-02-14 — End: 2023-02-14

## 2023-02-14 MED ORDER — SODIUM CHLORIDE 0.9% IV SOLUTION
250.0000 mL | INTRAVENOUS | Status: DC
  Administered 2023-02-14: 100 mL via INTRAVENOUS

## 2023-02-14 MED ORDER — DIPHENHYDRAMINE HCL 25 MG PO CAPS
25.0000 mg | ORAL_CAPSULE | Freq: Once | ORAL | Status: AC
  Administered 2023-02-14: 25 mg via ORAL
  Filled 2023-02-14: qty 1

## 2023-02-14 MED ORDER — ACETAMINOPHEN 325 MG PO TABS
650.0000 mg | ORAL_TABLET | Freq: Once | ORAL | Status: AC
  Administered 2023-02-14: 650 mg via ORAL
  Filled 2023-02-14: qty 2

## 2023-02-14 MED ORDER — HEPARIN SOD (PORK) LOCK FLUSH 100 UNIT/ML IV SOLN
500.0000 [IU] | Freq: Once | INTRAVENOUS | Status: AC
  Administered 2023-02-14: 500 [IU]

## 2023-02-14 MED ORDER — SODIUM CHLORIDE 0.9% FLUSH
10.0000 mL | Freq: Once | INTRAVENOUS | Status: AC
  Administered 2023-02-14: 10 mL

## 2023-02-14 MED ORDER — SODIUM CHLORIDE 0.9 % IV SOLN: Freq: Once | INTRAVENOUS | Status: AC

## 2023-02-14 MED ORDER — SODIUM CHLORIDE 0.9 % IV SOLN
250.0000 mg | Freq: Once | INTRAVENOUS | Status: AC
  Administered 2023-02-14: 250 mg via INTRAVENOUS
  Filled 2023-02-14: qty 250

## 2023-02-14 NOTE — Progress Notes (Signed)
Critical lab value reported: Hbg 5.8 w/BB hold.  Pt scheduled for 2 units of PRBCs today 02/14/2023.  Orders placed and released to Blood Bank.

## 2023-02-14 NOTE — Patient Instructions (Signed)
Sodium Ferric Gluconate Complex Injection What is this medication? SODIUM FERRIC GLUCONATE COMPLEX (SOE dee um FER ik GLOO koe nate KOM pleks) treats low levels of iron (iron deficiency anemia) in people with kidney disease. Iron is a mineral that plays an important role in making red blood cells, which carry oxygen from your lungs to the rest of your body. This medicine may be used for other purposes; ask your health care provider or pharmacist if you have questions. COMMON BRAND NAME(S): Ferrlecit, Nulecit What should I tell my care team before I take this medication? They need to know if you have any of the following conditions: Anemia that is not from iron deficiency High levels of iron in the blood An unusual or allergic reaction to iron, other medications, foods, dyes, or preservatives Pregnant or are trying to become pregnant Breast-feeding How should I use this medication? This medication is injected into a vein. It is given by your care team in a hospital or clinic setting. Talk to your care team about the use of this medication in children. While it may be prescribed for children as young as 6 years for selected conditions, precautions do apply. Overdosage: If you think you have taken too much of this medicine contact a poison control center or emergency room at once. NOTE: This medicine is only for you. Do not share this medicine with others. What if I miss a dose? It is important not to miss your dose. Call your care team if you are unable to keep an appointment. What may interact with this medication? Do not take this medication with any of the following: Deferasirox Deferoxamine Dimercaprol This medication may also interact with the following: Other iron products This list may not describe all possible interactions. Give your health care provider a list of all the medicines, herbs, non-prescription drugs, or dietary supplements you use. Also tell them if you smoke, drink  alcohol, or use illegal drugs. Some items may interact with your medicine. What should I watch for while using this medication? Your condition will be monitored carefully while you are receiving this medication. Visit your care team for regular checks on your progress. You may need blood work while you are taking this medication. What side effects may I notice from receiving this medication? Side effects that you should report to your care team as soon as possible: Allergic reactions--skin rash, itching, hives, swelling of the face, lips, tongue, or throat Low blood pressure--dizziness, feeling faint or lightheaded, blurry vision Shortness of breath Side effects that usually do not require medical attention (report to your care team if they continue or are bothersome): Flushing Headache Joint pain Muscle pain Nausea Pain, redness, or irritation at injection site This list may not describe all possible side effects. Call your doctor for medical advice about side effects. You may report side effects to FDA at 1-800-FDA-1088. Where should I keep my medication? This medication is given in a hospital or clinic and will not be stored at home. NOTE: This sheet is a summary. It may not cover all possible information. If you have questions about this medicine, talk to your doctor, pharmacist, or health care provider.  2024 Elsevier/Gold Standard (2020-08-25 00:00:00) Blood Transfusion, Adult, Care After After a blood transfusion, it is common to have: Bruising and soreness at the IV site. A headache. Follow these instructions at home: Your doctor may give you more instructions. If you have problems, contact your doctor. Insertion site care     Follow  instructions from your doctor about how to take care of your insertion site. This is where an IV tube was put into your vein. Make sure you: Wash your hands with soap and water for at least 20 seconds before and after you change your bandage. If  you cannot use soap and water, use hand sanitizer. Change your bandage as told by your doctor. Check your insertion site every day for signs of infection. Check for: Redness, swelling, or pain. Bleeding from the site. Warmth. Pus or a bad smell. General instructions Take over-the-counter and prescription medicines only as told by your doctor. Rest as told by your doctor. Go back to your normal activities as told by your doctor. Keep all follow-up visits. You may need to have tests at certain times to check your blood. Contact a doctor if: You have itching or red, swollen areas of skin (hives). You have a fever or chills. You have pain in the head, back, or chest. You feel worried or nervous (anxious). You feel weak after doing your normal activities. You have any of these problems at the insertion site: Redness, swelling, warmth, or pain. Bleeding that does not stop with pressure. Pus or a bad smell. If you received your blood transfusion in an outpatient setting, you will be told whom to contact to report any reactions. Get help right away if: You have signs of a serious reaction. This may be coming from an allergy or the body's defense system (immune system). Signs include: Trouble breathing or shortness of breath. Swelling of the face or feeling warm (flushed). A widespread rash. Dark pee (urine) or blood in the pee. Fast heartbeat. These symptoms may be an emergency. Get help right away. Call 911. Do not wait to see if the symptoms will go away. Do not drive yourself to the hospital. Summary Bruising and soreness at the IV site are common. Check your insertion site every day for signs of infection. Rest as told by your doctor. Go back to your normal activities as told by your doctor. Get help right away if you have signs of a serious reaction. This information is not intended to replace advice given to you by your health care provider. Make sure you discuss any questions you  have with your health care provider. Document Revised: 06/29/2021 Document Reviewed: 06/29/2021 Elsevier Patient Education  2024 [employer].

## 2023-02-14 NOTE — Progress Notes (Signed)
Per Dr. Bertis Ruddy, ok to run second unit of blood at 300 cc/hr.

## 2023-02-15 LAB — BPAM RBC
Blood Product Expiration Date: 202411272359
Blood Product Expiration Date: 202411272359
ISSUE DATE / TIME: 202411011316
ISSUE DATE / TIME: 202411011316
Unit Type and Rh: 600
Unit Type and Rh: 600

## 2023-02-15 LAB — TYPE AND SCREEN
ABO/RH(D): A NEG
Antibody Screen: NEGATIVE
Unit division: 0
Unit division: 0

## 2023-02-17 ENCOUNTER — Telehealth: Payer: Self-pay | Admitting: Hematology

## 2023-02-17 ENCOUNTER — Other Ambulatory Visit (HOSPITAL_COMMUNITY): Payer: Self-pay

## 2023-02-19 ENCOUNTER — Telehealth: Payer: Self-pay | Admitting: Hematology

## 2023-02-21 ENCOUNTER — Inpatient Hospital Stay: Payer: Medicaid Other

## 2023-02-21 ENCOUNTER — Encounter: Payer: Self-pay | Admitting: Nurse Practitioner

## 2023-02-21 ENCOUNTER — Other Ambulatory Visit (HOSPITAL_COMMUNITY): Payer: Self-pay

## 2023-02-21 ENCOUNTER — Inpatient Hospital Stay (HOSPITAL_BASED_OUTPATIENT_CLINIC_OR_DEPARTMENT_OTHER): Payer: Medicaid Other | Admitting: Nurse Practitioner

## 2023-02-21 ENCOUNTER — Other Ambulatory Visit: Payer: Self-pay | Admitting: Nurse Practitioner

## 2023-02-21 VITALS — BP 140/68 | HR 70 | Temp 97.8°F | Resp 18

## 2023-02-21 VITALS — BP 135/69 | HR 67 | Temp 98.2°F | Resp 18 | Ht 63.0 in | Wt 238.0 lb

## 2023-02-21 DIAGNOSIS — D500 Iron deficiency anemia secondary to blood loss (chronic): Secondary | ICD-10-CM

## 2023-02-21 DIAGNOSIS — I780 Hereditary hemorrhagic telangiectasia: Secondary | ICD-10-CM | POA: Diagnosis not present

## 2023-02-21 DIAGNOSIS — Z95828 Presence of other vascular implants and grafts: Secondary | ICD-10-CM

## 2023-02-21 LAB — CBC WITH DIFFERENTIAL (CANCER CENTER ONLY)
Abs Immature Granulocytes: 0.04 10*3/uL (ref 0.00–0.07)
Basophils Absolute: 0.1 10*3/uL (ref 0.0–0.1)
Basophils Relative: 1 %
Eosinophils Absolute: 0.2 10*3/uL (ref 0.0–0.5)
Eosinophils Relative: 2 %
HCT: 26.8 % — ABNORMAL LOW (ref 36.0–46.0)
Hemoglobin: 7.9 g/dL — ABNORMAL LOW (ref 12.0–15.0)
Immature Granulocytes: 0 %
Lymphocytes Relative: 12 %
Lymphs Abs: 1.1 10*3/uL (ref 0.7–4.0)
MCH: 25.2 pg — ABNORMAL LOW (ref 26.0–34.0)
MCHC: 29.5 g/dL — ABNORMAL LOW (ref 30.0–36.0)
MCV: 85.6 fL (ref 80.0–100.0)
Monocytes Absolute: 0.4 10*3/uL (ref 0.1–1.0)
Monocytes Relative: 5 %
Neutro Abs: 7.5 10*3/uL (ref 1.7–7.7)
Neutrophils Relative %: 80 %
Platelet Count: 273 10*3/uL (ref 150–400)
RBC: 3.13 MIL/uL — ABNORMAL LOW (ref 3.87–5.11)
RDW: 21.8 % — ABNORMAL HIGH (ref 11.5–15.5)
WBC Count: 9.2 10*3/uL (ref 4.0–10.5)
nRBC: 0 % (ref 0.0–0.2)

## 2023-02-21 LAB — TOTAL PROTEIN, URINE DIPSTICK: Protein, ur: 30 mg/dL — AB

## 2023-02-21 MED ORDER — SODIUM CHLORIDE 0.9% FLUSH
10.0000 mL | INTRAVENOUS | Status: DC | PRN
  Administered 2023-02-21: 10 mL

## 2023-02-21 MED ORDER — SODIUM CHLORIDE 0.9 % IV SOLN: Freq: Once | INTRAVENOUS | Status: AC

## 2023-02-21 MED ORDER — SODIUM CHLORIDE 0.9% FLUSH
10.0000 mL | Freq: Once | INTRAVENOUS | Status: AC
  Administered 2023-02-21: 10 mL

## 2023-02-21 MED ORDER — HEPARIN SOD (PORK) LOCK FLUSH 100 UNIT/ML IV SOLN
500.0000 [IU] | Freq: Once | INTRAVENOUS | Status: AC | PRN
  Administered 2023-02-21: 500 [IU]

## 2023-02-21 MED ORDER — BEVACIZUMAB-ADCD CHEMO INJECTION 400 MG/16ML
5.0000 mg/kg | Freq: Once | INTRAVENOUS | Status: AC
  Administered 2023-02-21: 500 mg via INTRAVENOUS
  Filled 2023-02-21: qty 4

## 2023-02-21 MED ORDER — ONDANSETRON HCL 4 MG PO TABS: 4.0000 mg | ORAL_TABLET | Freq: Three times a day (TID) | ORAL | 0 refills | Status: DC | PRN

## 2023-02-21 MED ORDER — MIRTAZAPINE 7.5 MG PO TABS: 7.5000 mg | ORAL_TABLET | Freq: Every day | ORAL | 1 refills | Status: DC

## 2023-02-21 MED ORDER — SODIUM CHLORIDE 0.9 % IV SOLN
250.0000 mg | Freq: Once | INTRAVENOUS | Status: AC
  Administered 2023-02-21: 250 mg via INTRAVENOUS
  Filled 2023-02-21: qty 20

## 2023-02-21 MED ORDER — AMINOCAPROIC ACID 500 MG PO TABS
1000.0000 mg | ORAL_TABLET | Freq: Two times a day (BID) | ORAL | 3 refills | Status: DC
  Filled 2023-02-21 – 2023-03-18 (×2): qty 120, 30d supply, fill #0
  Filled 2023-06-20: qty 120, 30d supply, fill #1
  Filled 2023-10-09: qty 120, 30d supply, fill #2

## 2023-02-21 NOTE — Progress Notes (Signed)
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 -she has required multiple blood transfusions and APCs since 2019. 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 -She had multiple prolonged hospital stay due to severe GI bleeding.   -s/p IR embolization of gastric artery branch vessels on 12/10/17, cauterization of stomach for severe GI bleed in 11/2018, and epinephrine injection for bleeding ulcer on 11/03/20. -She has required frequent blood transfusion, IV iron and bevacizumab -She does respond to treatment, anemia improved with IV iron and bevacizumab, but she is not compliant with her appointments.   Plan:  Labs reviewed  -CBC showing WBC 9.2; Hgb 7.9; Hct 26.8; Plt 273; Anc 7.5 -urine protein is mildly positive at 30 Patient to receive IV bevacizumab and IV iron today.  Return in one week to recheck labs. Will treat with pRBCs and/or iron next week Labs/flush, follow up, and infusion treatment with IV iron in 2 weeks.   The patient understands the plans discussed today and is in agreement with them.  She knows to contact our office if she develops concerns prior to her next appointment.  I provided 25 minutes of face-to-face time during this encounter and > 50% was spent counseling as documented under my assessment and plan.    Carlean Jews, NP  [facility] - A DEPT OF [facility] [address] [place] [address] Dept: [phone] Dept Fax: [phone]   No orders  of the defined types were placed in this encounter.     CHIEF COMPLAINT:  CC: f/u anemia   Current Treatment:  -Blood transfusion for Hg<8.0 (2units on different days if Hg<7) -iv ferric gluconate, with ferritin goal 100-200. -bevacizumab every 2 weeks   INTERVAL HISTORY:  Peggy House is here today for repeat clinical assessment. Today she states that she is feeling depressed. Gradually getting worse. Thinks about her husband often and gets tearful. Currently takes citalopram daily and trazodone as needed. This is not helping much with her sleep. She states that her last nosebleed was on Sunday. She denies blood in stool or hemoptysis. She states that she does have moderate fatigue and some nausea. She denies fevers or chills. She denies pain. Her appetite is good. Her weight has increased 3 pounds over last 2 weeks .  I have reviewed the past medical history, past surgical history, social history and family history with the patient and they are unchanged from previous note.  ALLERGIES:  is allergic to feraheme [ferumoxytol], nsaids, tomato, iron (ferrous sulfate) [ferrous sulfate er], and wasp venom.  MEDICATIONS:  Current Outpatient Medications  Medication Sig Dispense Refill   Accu-Chek Softclix Lancets lancets Use to check blood sugar before breakfast and before dinner while on steroids (Patient taking differently: 1 each by Other route See admin instructions. Use to check blood sugar before breakfast and before dinner while on steroids) 100 each 1   ALPRAZolam (XANAX) 0.5 MG tablet TAKE 1 TABLET BY MOUTH AT BEDTIME AS NEEDED FOR ANXIETY OR SLEEP 7 tablet 0   amLODipine (NORVASC) 10  MG tablet TAKE 1 TABLET(10 MG) BY MOUTH DAILY (Patient taking differently: Take 10 mg by mouth daily.) 90 tablet 3   citalopram (CELEXA) 20 MG tablet Take 1 tablet (20 mg total) by mouth daily. 90 tablet 3   glucose blood (ACCU-CHEK GUIDE) test strip Check blood sugar 2 times per day while on steroids before  breakfast and dinner (Patient taking differently: 1 each by Other route See admin instructions. Check blood sugar 2 times per day while on steroids before breakfast and dinner) 50 each 3   lisinopril (ZESTRIL) 20 MG tablet Take 20 mg by mouth daily.     metFORMIN (GLUCOPHAGE) 500 MG tablet TAKE 1 TABLET(500 MG) BY MOUTH TWICE DAILY WITH A MEAL 180 tablet 3   mirtazapine (REMERON) 7.5 MG tablet Take 1 tablet (7.5 mg total) by mouth at bedtime. 30 tablet 1   nicotine (NICODERM CQ - DOSED IN MG/24 HOURS) 14 mg/24hr patch Place 1 patch (14 mg total) onto the skin daily. (Patient taking differently: Place 14 mg onto the skin daily.) 30 patch 3   ondansetron (ZOFRAN) 4 MG tablet Take 1 tablet (4 mg total) by mouth every 8 (eight) hours as needed for nausea or vomiting. 30 tablet 0   pantoprazole (PROTONIX) 40 MG tablet TAKE 1 TABLET(40 MG) BY MOUTH TWICE DAILY (Patient taking differently: Take 40 mg by mouth daily.) 180 tablet 1   PROAIR HFA 108 (90 Base) MCG/ACT inhaler INHALE 1 TO 2 PUFFS INTO THE LUNGS EVERY 6 HOURS AS NEEDED FOR WHEEZING OR SHORTNESS OF BREATH (Patient taking differently: Inhale 1-2 puffs into the lungs every 6 (six) hours as needed for wheezing or shortness of breath.) 8.5 g 0   traZODone (DESYREL) 100 MG tablet TAKE 1 TABLET BY MOUTH AT BEDTIME 7 tablet 0   TYLENOL PM EXTRA STRENGTH 500-25 MG TABS tablet Take 2 tablets by mouth at bedtime as needed (for sleep, knee pain).     aminocaproic acid (AMICAR) 500 MG tablet Take 2 tablets (1,000 mg total) by mouth every 12 (twelve) hours. 120 tablet 3   No current facility-administered medications for this visit.   Facility-Administered Medications Ordered in Other Visits  Medication Dose Route Frequency Provider Last Rate Last Admin   diphenhydrAMINE (BENADRYL) 25 mg capsule            diphenhydrAMINE (BENADRYL) 25 mg capsule            ferric gluconate (FERRLECIT) 250 mg in sodium chloride 0.9 % 250 mL IVPB  250 mg Intravenous Once Malachy Mood, MD 135 mL/hr at 02/21/23 1412 250 mg at 02/21/23 1412   heparin lock flush 100 unit/mL  500 Units Intracatheter Once PRN Malachy Mood, MD       phenylephrine (NEO-SYNEPHRINE) 1 % nasal drops 2 drop  2 drop Left Nare Once Malachy Mood, MD       sodium chloride flush (NS) 0.9 % injection 10 mL  10 mL Intracatheter PRN Malachy Mood, MD           REVIEW OF SYSTEMS:   Constitutional: Denies fevers, chills or abnormal weight loss. Moderate fatigue  Eyes: Denies blurriness of vision Ears, nose, mouth, throat, and face: Denies mucositis or sore throat. Nasal congestion. Intermittent nose bleeds.  Respiratory: Denies cough, dyspnea or wheezes Cardiovascular: Denies palpitation, chest discomfort or lower extremity swelling Gastrointestinal:  Denies nausea, heartburn or change in bowel habits Skin: Denies abnormal skin rashes Lymphatics: Denies new lymphadenopathy or easy bruising Neurological:Denies numbness, tingling or new  weaknesses Behavioral/Psych: worsening depression. Difficulty sleeping.  All other systems were reviewed with the patient and are negative.   VITALS:   Today's Vitals   02/21/23 1135 02/21/23 1141  BP: (!) 140/69 135/69  Pulse: 67   Resp: 18   Temp: 98.2 F (36.8 C)   TempSrc: Temporal   SpO2: 100%   Weight: 238 lb (108 kg)   Height: 5\' 3"  (1.6 m)    Body mass index is 42.16 kg/m.   Wt Readings from Last 3 Encounters:  02/21/23 238 lb (108 kg)  02/06/23 235 lb (106.6 kg)  01/31/23 234 lb 12.6 oz (106.5 kg)    Body mass index is 42.16 kg/m.  Performance status (ECOG): 1 - Symptomatic but completely ambulatory  PHYSICAL EXAM:   GENERAL:alert, no distress and comfortable SKIN: skin color, texture, turgor are normal, no rashes or significant lesions EYES: normal, Conjunctiva are pink and non-injected, sclera clear OROPHARYNX:no exudate, no erythema and lips, buccal mucosa, and tongue normal  NECK: supple, thyroid normal size, non-tender, without  nodularity LYMPH:  no palpable lymphadenopathy in the cervical, axillary or inguinal LUNGS: clear to auscultation and percussion with normal breathing effort HEART: regular rate & rhythm and no murmurs and no lower extremity edema ABDOMEN:abdomen soft, non-tender and normal bowel sounds Musculoskeletal:no cyanosis of digits and no clubbing  NEURO: alert & oriented x 3 with fluent speech, no focal motor/sensory deficits PSYCH: tearful during visit  LABORATORY DATA:  I have reviewed the data as listed    Component Value Date/Time   NA 142 02/14/2023 1029   NA 147 (H) 04/02/2017 1708   NA 141 02/05/2017 1407   K 3.5 02/14/2023 1029   K 3.5 02/05/2017 1407   CL 111 02/14/2023 1029   CO2 25 02/14/2023 1029   CO2 24 02/05/2017 1407   GLUCOSE 117 (H) 02/14/2023 1029   GLUCOSE 134 02/05/2017 1407   BUN 6 (L) 02/14/2023 1029   BUN 5 (L) 04/02/2017 1708   BUN 9.3 02/05/2017 1407   CREATININE 0.80 02/14/2023 1029   CREATININE 0.9 02/05/2017 1407   CALCIUM 8.8 (L) 02/14/2023 1029   CALCIUM 8.9 02/05/2017 1407   PROT 6.6 02/14/2023 1029   PROT 6.7 04/02/2017 1708   PROT 7.1 02/05/2017 1407   ALBUMIN 3.7 02/14/2023 1029   ALBUMIN 3.8 04/02/2017 1708   ALBUMIN 3.5 02/05/2017 1407   AST 10 (L) 02/14/2023 1029   AST 13 02/05/2017 1407   ALT <5 02/14/2023 1029   ALT 8 02/05/2017 1407   ALKPHOS 61 02/14/2023 1029   ALKPHOS 84 02/05/2017 1407   BILITOT 0.3 02/14/2023 1029   BILITOT 0.23 02/05/2017 1407   GFRNONAA >60 02/14/2023 1029   GFRAA >60 01/06/2020 0921     Lab Results  Component Value Date   WBC 9.2 02/21/2023   NEUTROABS 7.5 02/21/2023   HGB 7.9 (L) 02/21/2023   HCT 26.8 (L) 02/21/2023   MCV 85.6 02/21/2023   PLT 273 02/21/2023

## 2023-02-21 NOTE — Patient Instructions (Addendum)
[facility] - A DEPT OF [facility]  Discharge Instructions: Thank you for choosing [facility] to provide your oncology and hematology care.   If you have a lab appointment with the Cancer Center, please go directly to the Cancer Center and check in at the registration area.   Wear comfortable clothing and clothing appropriate for easy access to any Portacath or PICC line.   We strive to give you quality time with your provider. You may need to reschedule your appointment if you arrive late (15 or more minutes).  Arriving late affects you and other patients whose appointments are after yours.  Also, if you miss three or more appointments without notifying the office, you may be dismissed from the clinic at the provider's discretion.      For prescription refill requests, have your pharmacy contact our office and allow 72 hours for refills to be completed.    Today you received the following chemotherapy and/or immunotherapy agents bevacizumab      To help prevent nausea and vomiting after your treatment, we encourage you to take your nausea medication as directed.  BELOW ARE SYMPTOMS THAT SHOULD BE REPORTED IMMEDIATELY: *FEVER GREATER THAN 100.4 F (38 C) OR HIGHER *CHILLS OR SWEATING *NAUSEA AND VOMITING THAT IS NOT CONTROLLED WITH YOUR NAUSEA MEDICATION *UNUSUAL SHORTNESS OF BREATH *UNUSUAL BRUISING OR BLEEDING *URINARY PROBLEMS (pain or burning when urinating, or frequent urination) *BOWEL PROBLEMS (unusual diarrhea, constipation, pain near the anus) TENDERNESS IN MOUTH AND THROAT WITH OR WITHOUT PRESENCE OF ULCERS (sore throat, sores in mouth, or a toothache) UNUSUAL RASH, SWELLING OR PAIN  UNUSUAL VAGINAL DISCHARGE OR ITCHING   Items with * indicate a potential emergency and should be followed up as soon as possible or go to the Emergency Department if any problems should occur.  Please show the CHEMOTHERAPY ALERT CARD or  IMMUNOTHERAPY ALERT CARD at check-in to the Emergency Department and triage nurse.  Should you have questions after your visit or need to cancel or reschedule your appointment, please contact [facility] - A DEPT OF [facility]  Dept: [phone]  and follow the prompts.  Office hours are 8:00 a.m. to 4:30 p.m. Monday - Friday. Please note that voicemails left after 4:00 p.m. may not be returned until the following business day.  We are closed weekends and major holidays. You have access to a nurse at all times for urgent questions. Please call the main number to the clinic Dept: [phone] and follow the prompts.   For any non-urgent questions, you may also contact your provider using MyChart. We now offer e-Visits for anyone 26 and older to request care online for non-urgent symptoms. For details visit [email or web address].   Also download the MyChart app! Go to the app store, search "MyChart", open the app, select , and log in with your MyChart username and password.   Sodium Ferric Gluconate Complex Injection What is this medication? SODIUM FERRIC GLUCONATE COMPLEX (SOE dee um FER ik GLOO koe nate KOM pleks) treats low levels of iron (iron deficiency anemia) in people with kidney disease. Iron is a mineral that plays an important role in making red blood cells, which carry oxygen from your lungs to the rest of your body. This medicine may be used for other purposes; ask your health care provider or pharmacist if you have questions. COMMON BRAND NAME(S): Ferrlecit, Nulecit What should I tell my care team before I  take this medication? They need to know if you have any of the following conditions: Anemia that is not from iron deficiency High levels of iron in the blood An unusual or allergic reaction to iron, other medications, foods, dyes, or preservatives Pregnant or are trying to become pregnant Breast-feeding How should I use this  medication? This medication is injected into a vein. It is given by your care team in a hospital or clinic setting. Talk to your care team about the use of this medication in children. While it may be prescribed for children as young as 6 years for selected conditions, precautions do apply. Overdosage: If you think you have taken too much of this medicine contact a poison control center or emergency room at once. NOTE: This medicine is only for you. Do not share this medicine with others. What if I miss a dose? It is important not to miss your dose. Call your care team if you are unable to keep an appointment. What may interact with this medication? Do not take this medication with any of the following: Deferasirox Deferoxamine Dimercaprol This medication may also interact with the following: Other iron products This list may not describe all possible interactions. Give your health care provider a list of all the medicines, herbs, non-prescription drugs, or dietary supplements you use. Also tell them if you smoke, drink alcohol, or use illegal drugs. Some items may interact with your medicine. What should I watch for while using this medication? Your condition will be monitored carefully while you are receiving this medication. Visit your care team for regular checks on your progress. You may need blood work while you are taking this medication. What side effects may I notice from receiving this medication? Side effects that you should report to your care team as soon as possible: Allergic reactions--skin rash, itching, hives, swelling of the face, lips, tongue, or throat Low blood pressure--dizziness, feeling faint or lightheaded, blurry vision Shortness of breath Side effects that usually do not require medical attention (report to your care team if they continue or are bothersome): Flushing Headache Joint pain Muscle pain Nausea Pain, redness, or irritation at injection site This  list may not describe all possible side effects. Call your doctor for medical advice about side effects. You may report side effects to FDA at 1-800-FDA-1088. Where should I keep my medication? This medication is given in a hospital or clinic and will not be stored at home. NOTE: This sheet is a summary. It may not cover all possible information. If you have questions about this medicine, talk to your doctor, pharmacist, or health care provider.  2024 Elsevier/Gold Standard (2020-08-25 00:00:00) Blood Transfusion, Adult, Care After After a blood transfusion, it is common to have: Bruising and soreness at the IV site. A headache. Follow these instructions at home: Your doctor may give you more instructions. If you have problems, contact your doctor. Insertion site care     Follow instructions from your doctor about how to take care of your insertion site. This is where an IV tube was put into your vein. Make sure you: Wash your hands with soap and water for at least 20 seconds before and after you change your bandage. If you cannot use soap and water, use hand sanitizer. Change your bandage as told by your doctor. Check your insertion site every day for signs of infection. Check for: Redness, swelling, or pain. Bleeding from the site. Warmth. Pus or a bad smell. General instructions Take over-the-counter  and prescription medicines only as told by your doctor. Rest as told by your doctor. Go back to your normal activities as told by your doctor. Keep all follow-up visits. You may need to have tests at certain times to check your blood. Contact a doctor if: You have itching or red, swollen areas of skin (hives). You have a fever or chills. You have pain in the head, back, or chest. You feel worried or nervous (anxious). You feel weak after doing your normal activities. You have any of these problems at the insertion site: Redness, swelling, warmth, or pain. Bleeding that does not  stop with pressure. Pus or a bad smell. If you received your blood transfusion in an outpatient setting, you will be told whom to contact to report any reactions. Get help right away if: You have signs of a serious reaction. This may be coming from an allergy or the body's defense system (immune system). Signs include: Trouble breathing or shortness of breath. Swelling of the face or feeling warm (flushed). A widespread rash. Dark pee (urine) or blood in the pee. Fast heartbeat. These symptoms may be an emergency. Get help right away. Call 911. Do not wait to see if the symptoms will go away. Do not drive yourself to the hospital. Summary Bruising and soreness at the IV site are common. Check your insertion site every day for signs of infection. Rest as told by your doctor. Go back to your normal activities as told by your doctor. Get help right away if you have signs of a serious reaction. This information is not intended to replace advice given to you by your health care provider. Make sure you discuss any questions you have with your health care provider. Document Revised: 06/29/2021 Document Reviewed: 06/29/2021 Elsevier Patient Education  2024 [employer].

## 2023-02-21 NOTE — Assessment & Plan Note (Signed)
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 -she has required multiple blood transfusions and APCs since 2019. 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 -She had multiple prolonged hospital stay due to severe GI bleeding.   -s/p IR embolization of gastric artery branch vessels on 12/10/17, cauterization of stomach for severe GI bleed in 11/2018, and epinephrine injection for bleeding ulcer on 11/03/20. -She has required frequent blood transfusion, IV iron and bevacizumab -She does respond to treatment, anemia improved with IV iron and bevacizumab, but she is not compliant with her appointments.

## 2023-02-24 ENCOUNTER — Telehealth: Payer: Self-pay | Admitting: Hematology

## 2023-02-28 ENCOUNTER — Inpatient Hospital Stay: Payer: Medicaid Other

## 2023-02-28 ENCOUNTER — Telehealth: Payer: Self-pay

## 2023-02-28 NOTE — Telephone Encounter (Signed)
Pt called stating she's not feeling well and called earlier this morning asking if she could come in later today 02/28/2023 d/t not feeling well this morning.  Pt stated she was told "Yes" by the Infusion Charge Nurse but would need to arrive no later than 11:30am.  Pt called Dr. Latanya Maudlin office (for the 1st time) asking if it was too late for the pt to come in to get her infusion.  Time of pt's call was 1215.  Spoke w/Infusion Press photographer to see if the pt could still come in; unfortunately, infusion could not see the pt today since the pt did not meet the 1130 new appt time.  Informed pt that Infusion could not see her today and the appts for today would have to be rescheduled.  Informed pt that Dr. Latanya Maudlin scheduler will contact the pt to get her rescheduled.  Stated if the pt symptoms progress over the weekend, please go to the ER for further evaluation.  Pt verbalized understanding and had no further questions or concerns.

## 2023-03-04 ENCOUNTER — Other Ambulatory Visit (HOSPITAL_COMMUNITY): Payer: Self-pay

## 2023-03-07 ENCOUNTER — Telehealth: Payer: Self-pay | Admitting: Hematology

## 2023-03-07 ENCOUNTER — Inpatient Hospital Stay: Payer: Medicaid Other

## 2023-03-07 ENCOUNTER — Other Ambulatory Visit: Payer: Self-pay | Admitting: Hematology

## 2023-03-07 ENCOUNTER — Other Ambulatory Visit: Payer: Self-pay

## 2023-03-07 VITALS — BP 146/68 | HR 66 | Temp 98.7°F | Resp 18 | Wt 245.0 lb

## 2023-03-07 DIAGNOSIS — D649 Anemia, unspecified: Secondary | ICD-10-CM

## 2023-03-07 DIAGNOSIS — D500 Iron deficiency anemia secondary to blood loss (chronic): Secondary | ICD-10-CM

## 2023-03-07 DIAGNOSIS — I780 Hereditary hemorrhagic telangiectasia: Secondary | ICD-10-CM

## 2023-03-07 DIAGNOSIS — D62 Acute posthemorrhagic anemia: Secondary | ICD-10-CM

## 2023-03-07 DIAGNOSIS — Z95828 Presence of other vascular implants and grafts: Secondary | ICD-10-CM

## 2023-03-07 LAB — CBC WITH DIFFERENTIAL (CANCER CENTER ONLY)
Abs Immature Granulocytes: 0.02 10*3/uL (ref 0.00–0.07)
Basophils Absolute: 0 10*3/uL (ref 0.0–0.1)
Basophils Relative: 0 %
Eosinophils Absolute: 0.1 10*3/uL (ref 0.0–0.5)
Eosinophils Relative: 1 %
HCT: 19.7 % — ABNORMAL LOW (ref 36.0–46.0)
Hemoglobin: 5.7 g/dL — CL (ref 12.0–15.0)
Immature Granulocytes: 0 %
Lymphocytes Relative: 13 %
Lymphs Abs: 1.3 10*3/uL (ref 0.7–4.0)
MCH: 25 pg — ABNORMAL LOW (ref 26.0–34.0)
MCHC: 28.9 g/dL — ABNORMAL LOW (ref 30.0–36.0)
MCV: 86.4 fL (ref 80.0–100.0)
Monocytes Absolute: 0.5 10*3/uL (ref 0.1–1.0)
Monocytes Relative: 5 %
Neutro Abs: 8.2 10*3/uL — ABNORMAL HIGH (ref 1.7–7.7)
Neutrophils Relative %: 81 %
Platelet Count: 271 10*3/uL (ref 150–400)
RBC: 2.28 MIL/uL — ABNORMAL LOW (ref 3.87–5.11)
RDW: 22 % — ABNORMAL HIGH (ref 11.5–15.5)
WBC Count: 10.2 10*3/uL (ref 4.0–10.5)
nRBC: 0 % (ref 0.0–0.2)

## 2023-03-07 LAB — CMP (CANCER CENTER ONLY)
ALT: <5 U/L (ref 0–44)
AST: 10 U/L — ABNORMAL LOW (ref 15–41)
Albumin: 3.6 g/dL (ref 3.5–5.0)
Alkaline Phosphatase: 59 U/L (ref 38–126)
Anion gap: 4 — ABNORMAL LOW (ref 5–15)
BUN: 9 mg/dL (ref 8–23)
CO2: 25 mmol/L (ref 22–32)
Calcium: 8.6 mg/dL — ABNORMAL LOW (ref 8.9–10.3)
Chloride: 111 mmol/L (ref 98–111)
Creatinine: 0.84 mg/dL (ref 0.44–1.00)
GFR, Estimated: >60 mL/min (ref >60)
Glucose, Bld: 103 mg/dL — ABNORMAL HIGH (ref 70–99)
Potassium: 3.7 mmol/L (ref 3.5–5.1)
Sodium: 140 mmol/L (ref 135–145)
Total Bilirubin: 0.2 mg/dL (ref <1.2)
Total Protein: 6.4 g/dL — ABNORMAL LOW (ref 6.5–8.1)

## 2023-03-07 LAB — TOTAL PROTEIN, URINE DIPSTICK: Protein, ur: 30 mg/dL — AB

## 2023-03-07 LAB — SAMPLE TO BLOOD BANK

## 2023-03-07 LAB — FERRITIN: Ferritin: 28 ng/mL (ref 11–307)

## 2023-03-07 LAB — PREPARE RBC (CROSSMATCH)

## 2023-03-07 MED ORDER — HEPARIN SOD (PORK) LOCK FLUSH 100 UNIT/ML IV SOLN
500.0000 [IU] | Freq: Once | INTRAVENOUS | Status: AC
  Administered 2023-03-07: 500 [IU] via INTRAVENOUS

## 2023-03-07 MED ORDER — ACETAMINOPHEN 325 MG PO TABS
650.0000 mg | ORAL_TABLET | Freq: Four times a day (QID) | ORAL | Status: DC | PRN
Start: 2023-03-07 — End: 2023-03-07
  Administered 2023-03-07: 650 mg via ORAL
  Filled 2023-03-07: qty 2

## 2023-03-07 MED ORDER — SODIUM CHLORIDE 0.9 % IV SOLN: Freq: Once | INTRAVENOUS | Status: AC

## 2023-03-07 MED ORDER — SODIUM CHLORIDE 0.9% FLUSH
10.0000 mL | Freq: Once | INTRAVENOUS | Status: AC
  Administered 2023-03-07: 10 mL via INTRAVENOUS

## 2023-03-07 MED ORDER — HEPARIN SOD (PORK) LOCK FLUSH 100 UNIT/ML IV SOLN: 500.0000 [IU] | Freq: Once | INTRAVENOUS | Status: AC | PRN

## 2023-03-07 MED ORDER — SODIUM CHLORIDE 0.9 % IV SOLN
5.0000 mg/kg | Freq: Once | INTRAVENOUS | Status: AC
  Administered 2023-03-07: 500 mg via INTRAVENOUS
  Filled 2023-03-07: qty 4

## 2023-03-07 MED ORDER — SODIUM CHLORIDE 0.9% IV SOLUTION
250.0000 mL | INTRAVENOUS | Status: DC
Start: 2023-03-07 — End: 2023-03-07

## 2023-03-07 MED ORDER — SODIUM CHLORIDE 0.9% FLUSH: 10.0000 mL | INTRAVENOUS | Status: DC | PRN

## 2023-03-07 MED ORDER — SODIUM CHLORIDE 0.9% FLUSH
10.0000 mL | Freq: Once | INTRAVENOUS | Status: AC
  Administered 2023-03-07: 10 mL

## 2023-03-07 MED ORDER — DIPHENHYDRAMINE HCL 25 MG PO CAPS
50.0000 mg | ORAL_CAPSULE | Freq: Four times a day (QID) | ORAL | Status: DC | PRN
Start: 2023-03-07 — End: 2023-03-07
  Administered 2023-03-07: 50 mg via ORAL
  Filled 2023-03-07: qty 2

## 2023-03-07 NOTE — Progress Notes (Signed)
Bevacizumab and 1 unit PRBC today. 1 unit PRBC tomorrow. No Ferrlecit today or tomorrow due to time constraints.  Anola Gurney [place], [place], BCPS, BCOP 03/07/2023 3:02 PM

## 2023-03-07 NOTE — Patient Instructions (Signed)
[facility] - A DEPT OF [facility]  Discharge Instructions: Thank you for choosing [facility] to provide your oncology and hematology care.   If you have a lab appointment with the Cancer Center, please go directly to the Cancer Center and check in at the registration area.   Wear comfortable clothing and clothing appropriate for easy access to any Portacath or PICC line.   We strive to give you quality time with your provider. You may need to reschedule your appointment if you arrive late (15 or more minutes).  Arriving late affects you and other patients whose appointments are after yours.  Also, if you miss three or more appointments without notifying the office, you may be dismissed from the clinic at the provider's discretion.      For prescription refill requests, have your pharmacy contact our office and allow 72 hours for refills to be completed.    Today you received the following chemotherapy and/or immunotherapy agents: Vegzelma      To help prevent nausea and vomiting after your treatment, we encourage you to take your nausea medication as directed.  BELOW ARE SYMPTOMS THAT SHOULD BE REPORTED IMMEDIATELY: *FEVER GREATER THAN 100.4 F (38 C) OR HIGHER *CHILLS OR SWEATING *NAUSEA AND VOMITING THAT IS NOT CONTROLLED WITH YOUR NAUSEA MEDICATION *UNUSUAL SHORTNESS OF BREATH *UNUSUAL BRUISING OR BLEEDING *URINARY PROBLEMS (pain or burning when urinating, or frequent urination) *BOWEL PROBLEMS (unusual diarrhea, constipation, pain near the anus) TENDERNESS IN MOUTH AND THROAT WITH OR WITHOUT PRESENCE OF ULCERS (sore throat, sores in mouth, or a toothache) UNUSUAL RASH, SWELLING OR PAIN  UNUSUAL VAGINAL DISCHARGE OR ITCHING   Items with * indicate a potential emergency and should be followed up as soon as possible or go to the Emergency Department if any problems should occur.  Please show the CHEMOTHERAPY ALERT CARD or IMMUNOTHERAPY  ALERT CARD at check-in to the Emergency Department and triage nurse.  Should you have questions after your visit or need to cancel or reschedule your appointment, please contact [facility] - A DEPT OF [facility]  Dept: [phone]  and follow the prompts.  Office hours are 8:00 a.m. to 4:30 p.m. Monday - Friday. Please note that voicemails left after 4:00 p.m. may not be returned until the following business day.  We are closed weekends and major holidays. You have access to a nurse at all times for urgent questions. Please call the main number to the clinic Dept: [phone] and follow the prompts.   For any non-urgent questions, you may also contact your provider using MyChart. We now offer e-Visits for anyone 64 and older to request care online for non-urgent symptoms. For details visit [email or web address].   Also download the MyChart app! Go to the app store, search "MyChart", open the app, select [facility], and log in with your MyChart username and password.  Blood Transfusion, Adult, Care After After a blood transfusion, it is common to have: Bruising and soreness at the IV site. A headache. Follow these instructions at home: Your doctor may give you more instructions. If you have problems, contact your doctor. Insertion site care     Follow instructions from your doctor about how to take care of your insertion site. This is where an IV tube was put into your vein. Make sure you: Wash your hands with soap and water for at least 20 seconds before and after you change your bandage.  If you cannot use soap and water, use hand sanitizer. Change your bandage as told by your doctor. Check your insertion site every day for signs of infection. Check for: Redness, swelling, or pain. Bleeding from the site. Warmth. Pus or a bad smell. General instructions Take over-the-counter and prescription medicines only as told by your doctor. Rest as told by  your doctor. Go back to your normal activities as told by your doctor. Keep all follow-up visits. You may need to have tests at certain times to check your blood. Contact a doctor if: You have itching or red, swollen areas of skin (hives). You have a fever or chills. You have pain in the head, back, or chest. You feel worried or nervous (anxious). You feel weak after doing your normal activities. You have any of these problems at the insertion site: Redness, swelling, warmth, or pain. Bleeding that does not stop with pressure. Pus or a bad smell. If you received your blood transfusion in an outpatient setting, you will be told whom to contact to report any reactions. Get help right away if: You have signs of a serious reaction. This may be coming from an allergy or the body's defense system (immune system). Signs include: Trouble breathing or shortness of breath. Swelling of the face or feeling warm (flushed). A widespread rash. Dark pee (urine) or blood in the pee. Fast heartbeat. These symptoms may be an emergency. Get help right away. Call 911. Do not wait to see if the symptoms will go away. Do not drive yourself to the hospital. Summary Bruising and soreness at the IV site are common. Check your insertion site every day for signs of infection. Rest as told by your doctor. Go back to your normal activities as told by your doctor. Get help right away if you have signs of a serious reaction. This information is not intended to replace advice given to you by your health care provider. Make sure you discuss any questions you have with your health care provider. Document Revised: 06/29/2021 Document Reviewed: 06/29/2021 Elsevier Patient Education  2024 [employer].

## 2023-03-08 ENCOUNTER — Inpatient Hospital Stay: Payer: Medicaid Other

## 2023-03-11 LAB — TYPE AND SCREEN
ABO/RH(D): A NEG
Antibody Screen: NEGATIVE
Unit division: 0
Unit division: 0

## 2023-03-11 LAB — BPAM RBC
Blood Product Expiration Date: 202412212359
Blood Product Expiration Date: 202412212359
ISSUE DATE / TIME: 202411221511
Unit Type and Rh: 600
Unit Type and Rh: 600

## 2023-03-17 ENCOUNTER — Telehealth: Payer: Self-pay | Admitting: Hematology

## 2023-03-18 ENCOUNTER — Inpatient Hospital Stay: Payer: Medicaid Other | Admitting: Licensed Clinical Social Worker

## 2023-03-18 ENCOUNTER — Inpatient Hospital Stay: Payer: Medicaid Other | Attending: Hematology

## 2023-03-18 ENCOUNTER — Other Ambulatory Visit: Payer: Self-pay

## 2023-03-18 ENCOUNTER — Other Ambulatory Visit (HOSPITAL_COMMUNITY): Payer: Self-pay

## 2023-03-18 ENCOUNTER — Inpatient Hospital Stay (HOSPITAL_BASED_OUTPATIENT_CLINIC_OR_DEPARTMENT_OTHER): Payer: Medicaid Other | Admitting: Hematology

## 2023-03-18 VITALS — BP 173/68 | HR 63 | Temp 98.5°F | Resp 18 | Wt 224.0 lb

## 2023-03-18 DIAGNOSIS — D500 Iron deficiency anemia secondary to blood loss (chronic): Secondary | ICD-10-CM

## 2023-03-18 DIAGNOSIS — I780 Hereditary hemorrhagic telangiectasia: Secondary | ICD-10-CM

## 2023-03-18 DIAGNOSIS — D62 Acute posthemorrhagic anemia: Secondary | ICD-10-CM

## 2023-03-18 DIAGNOSIS — Z5112 Encounter for antineoplastic immunotherapy: Secondary | ICD-10-CM | POA: Diagnosis present

## 2023-03-18 DIAGNOSIS — R040 Epistaxis: Secondary | ICD-10-CM | POA: Diagnosis not present

## 2023-03-18 DIAGNOSIS — D649 Anemia, unspecified: Secondary | ICD-10-CM

## 2023-03-18 DIAGNOSIS — Z95828 Presence of other vascular implants and grafts: Secondary | ICD-10-CM

## 2023-03-18 LAB — SAMPLE TO BLOOD BANK

## 2023-03-18 LAB — CBC WITH DIFFERENTIAL (CANCER CENTER ONLY)
Abs Immature Granulocytes: 0.04 10*3/uL (ref 0.00–0.07)
Basophils Absolute: 0 10*3/uL (ref 0.0–0.1)
Basophils Relative: 1 %
Eosinophils Absolute: 0.1 10*3/uL (ref 0.0–0.5)
Eosinophils Relative: 2 %
HCT: 17.8 % — ABNORMAL LOW (ref 36.0–46.0)
Hemoglobin: 5.1 g/dL — CL (ref 12.0–15.0)
Immature Granulocytes: 1 %
Lymphocytes Relative: 12 %
Lymphs Abs: 1 10*3/uL (ref 0.7–4.0)
MCH: 24.2 pg — ABNORMAL LOW (ref 26.0–34.0)
MCHC: 28.7 g/dL — ABNORMAL LOW (ref 30.0–36.0)
MCV: 84.4 fL (ref 80.0–100.0)
Monocytes Absolute: 0.4 10*3/uL (ref 0.1–1.0)
Monocytes Relative: 5 %
Neutro Abs: 6.8 10*3/uL (ref 1.7–7.7)
Neutrophils Relative %: 79 %
Platelet Count: 253 10*3/uL (ref 150–400)
RBC: 2.11 MIL/uL — ABNORMAL LOW (ref 3.87–5.11)
RDW: 20.9 % — ABNORMAL HIGH (ref 11.5–15.5)
WBC Count: 8.4 10*3/uL (ref 4.0–10.5)
nRBC: 0 % (ref 0.0–0.2)

## 2023-03-18 LAB — PREPARE RBC (CROSSMATCH)

## 2023-03-18 LAB — FERRITIN: Ferritin: 10 ng/mL — ABNORMAL LOW (ref 11–307)

## 2023-03-18 MED ORDER — SODIUM CHLORIDE 0.9% IV SOLUTION
250.0000 mL | INTRAVENOUS | Status: DC
  Administered 2023-03-18: 50 mL via INTRAVENOUS

## 2023-03-18 MED ORDER — SODIUM CHLORIDE 0.9% FLUSH
10.0000 mL | Freq: Once | INTRAVENOUS | Status: AC
  Administered 2023-03-18: 10 mL

## 2023-03-18 MED ORDER — DIPHENHYDRAMINE HCL 25 MG PO CAPS
25.0000 mg | ORAL_CAPSULE | Freq: Once | ORAL | Status: AC
  Administered 2023-03-18: 25 mg via ORAL
  Filled 2023-03-18: qty 1

## 2023-03-18 MED ORDER — SODIUM CHLORIDE 0.9 % IV SOLN: Freq: Once | INTRAVENOUS | Status: AC

## 2023-03-18 MED ORDER — SODIUM CHLORIDE 0.9 % IV SOLN
250.0000 mg | Freq: Once | INTRAVENOUS | Status: AC
  Administered 2023-03-18: 250 mg via INTRAVENOUS
  Filled 2023-03-18: qty 20

## 2023-03-18 MED ORDER — ACETAMINOPHEN 325 MG PO TABS
650.0000 mg | ORAL_TABLET | Freq: Once | ORAL | Status: AC
  Administered 2023-03-18: 650 mg via ORAL
  Filled 2023-03-18: qty 2

## 2023-03-18 MED ORDER — HEPARIN SOD (PORK) LOCK FLUSH 100 UNIT/ML IV SOLN
500.0000 [IU] | Freq: Once | INTRAVENOUS | Status: AC
  Administered 2023-03-18: 500 [IU]

## 2023-03-18 NOTE — Progress Notes (Signed)
[facility] CSW Progress Note  Clinical Child psychotherapist  met w/ pt in infusion to discuss inconsistency w/ regards to appointments.  Pt has historically had difficulty consistently coming to appointments.  Pt reports she is residing w/ her son and his significant other as well as their 62 year old daughter.  There is only 1 vehicle and pt is reliant on her son's significant other to decide day to day if she will provide assistance w/ transportation.  CSW spoke at length w/ pt about the importance of taking care of herself and encouraged pt to consider an environment that would better support her needs.  Pt will explore other options and will remain in contact w/ CSW .  CSW w/ check in w/ pt prior to her next appointment.        Rachel Moulds, LCSW Clinical Social Worker [facility]    Patient is participating in a Managed Medicaid Plan:  Yes

## 2023-03-18 NOTE — Progress Notes (Signed)
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 -she has required multiple blood transfusions and APCs since 2019. 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 -She had multiple prolonged hospital stay due to severe GI bleeding.   -s/p IR embolization of gastric artery branch vessels on 12/10/17, cauterization of stomach for severe GI bleed in 11/2018, and epinephrine injection for bleeding ulcer on 11/03/20. -She has required frequent blood transfusion, IV iron and bevacizumab -She does respond to treatment, anemia improved with IV iron and bevacizumab, but she is not compliant with her appointments.    Assessment and Plan    Anemia Chronic anemia with recurrent episodes due to uncontrolled bleeding. Ferritin level is 5.7, indicating iron deficiency. Recent significant epistaxis reported, no melena. Risks of untreated anemia include ER visits and complications. Benefits of treatment include stabilization of hemoglobin and prevention of severe anemia. Emphasized the importance of attending scheduled  appointments. - Administer IV iron - Administer blood transfusion when available - Schedule follow-up appointments based on attendance - Coordinate transportation services for future appointments - Refer to social worker Milagros Reap for additional support  HHT Ongoing bleeding issues, including recent significant epistaxis. Difficulty obtaining Emeka due to financial constraints. Discussed the importance of consistent medication use to manage bleeding and prevent severe episodes. - Ensure Amicar is obtained - Discuss financial assistance for medications with social worker  Appointment compliance  -Patient has had frequent no-shows, but repeated discussion and emphasize on compliance  -emphasized the critical nature of attending appointments to prevent severe health complications related to anemia and bleeding issues. - Educate on the importance of attending scheduled appointments - Encourage use of transportation services - Instruct to call if unable to attend an appointment to reschedule  Follow-up -Lab reviewed, will proceed to IV iron today and 1 unit of blood transfusion today, and the second unit of blood transfusion later this week - schedule bevacizumab treatment for later this week or next week based on availability -f/u with next infusion  SW Darl Pikes what made her today, and get her connected with transportation service -Due to her poor compliance, we are only able to schedule 1 appointment for her now, and she will call to reschedule if she is not able to make it.  -She knows to go to emergency room if she is not feeling well, or have active bleeding and cannot wait for next appointment.    Discussed the use of AI scribe software for clinical note transcription with the patient, who gave verbal consent to proceed.  History of Present Illness   The patient, with a history of anemia and  a bleeding disorder, presents after missing multiple appointments for blood transfusions. She  attributes the missed appointments to transportation issues, as her son, who usually brings her, was unable to do so. She acknowledges the seriousness of her condition and the potential for an emergency room visit if she does not receive treatment. She reports a recent nosebleed but denies any blood in the stool. She also mentions needing to refill her anxiety medication, which is managed by her primary care physician. She expresses financial difficulties in picking up her medication to stop the bleeding.      All other systems were reviewed with the patient and are negative.  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    SURGICAL HISTORY: Past Surgical History:  Procedure Laterality Date   ABDOMINAL HYSTERECTOMY     CARPAL TUNNEL RELEASE  05/13/2011   Procedure: CARPAL TUNNEL RELEASE;  Surgeon: Mable Paris, MD;  Location: [facility];  Service: Orthopedics;  Laterality: Left;   COLONOSCOPY N/A 03/02/2020   Procedure: COLONOSCOPY;  Surgeon: Bernette Redbird, MD;  Location: [facility] ENDOSCOPY;  Service: Endoscopy;  Laterality: N/A;   COLONOSCOPY WITH PROPOFOL N/A 04/28/2014   Procedure: COLONOSCOPY WITH PROPOFOL;  Surgeon: Florencia Reasons, MD;  Location: [facility] ENDOSCOPY;  Service: Endoscopy;  Laterality: N/A;   DG TOES*L*  2/10   rt   DILATION AND CURETTAGE OF UTERUS     ENTEROSCOPY N/A 10/17/2017   Procedure: ENTEROSCOPY;  Surgeon: Kathi Der, MD;  Location: [facility] ENDOSCOPY;  Service: Gastroenterology;  Laterality: N/A;   ESOPHAGOGASTRODUODENOSCOPY N/A 04/10/2014   Procedure: ESOPHAGOGASTRODUODENOSCOPY (EGD);   Surgeon: Shirley Friar, MD;  Location: [facility] ENDOSCOPY;  Service: Endoscopy;  Laterality: N/A;   ESOPHAGOGASTRODUODENOSCOPY N/A 05/10/2017   Procedure: ESOPHAGOGASTRODUODENOSCOPY (EGD);  Surgeon: Bernette Redbird, MD;  Location: [facility] ENDOSCOPY;  Service: Endoscopy;  Laterality: N/A;   ESOPHAGOGASTRODUODENOSCOPY N/A 09/22/2017   Procedure: ESOPHAGOGASTRODUODENOSCOPY (EGD);  Surgeon: Vida Rigger, MD;  Location: [facility] ENDOSCOPY;  Service: Endoscopy;  Laterality: N/A;  bedside   ESOPHAGOGASTRODUODENOSCOPY N/A 03/02/2020   Procedure: ESOPHAGOGASTRODUODENOSCOPY (EGD);  Surgeon: Bernette Redbird, MD;  Location: [facility] ENDOSCOPY;  Service: Endoscopy;  Laterality: N/A;   ESOPHAGOGASTRODUODENOSCOPY N/A 11/03/2020   Procedure: ESOPHAGOGASTRODUODENOSCOPY (EGD);  Surgeon: Charlott Rakes, MD;  Location: [facility] ENDOSCOPY;  Service: Endoscopy;  Laterality: N/A;   ESOPHAGOGASTRODUODENOSCOPY N/A 04/12/2022   Procedure: ESOPHAGOGASTRODUODENOSCOPY (EGD);  Surgeon: Vida Rigger, MD;  Location: [facility] ENDOSCOPY;  Service: Gastroenterology;  Laterality: N/A;   ESOPHAGOGASTRODUODENOSCOPY N/A 05/27/2022   Procedure: ESOPHAGOGASTRODUODENOSCOPY (EGD);  Surgeon: Willis Modena, MD;  Location: [facility] ENDOSCOPY;  Service: Gastroenterology;  Laterality: N/A;   ESOPHAGOGASTRODUODENOSCOPY N/A 09/27/2022   Procedure: ESOPHAGOGASTRODUODENOSCOPY (EGD);  Surgeon: Lynann Bologna, DO;  Location: [facility] ENDOSCOPY;  Service: Gastroenterology;  Laterality: N/A;   ESOPHAGOGASTRODUODENOSCOPY (EGD) WITH PROPOFOL N/A 04/27/2014   Procedure: ESOPHAGOGASTRODUODENOSCOPY (EGD) WITH PROPOFOL;  Surgeon: Florencia Reasons, MD;  Location: [facility] ENDOSCOPY;  Service: Endoscopy;  Laterality: N/A;  possible apc   ESOPHAGOGASTRODUODENOSCOPY (EGD) WITH PROPOFOL N/A 09/30/2017   Procedure: ESOPHAGOGASTRODUODENOSCOPY (EGD) WITH PROPOFOL;  Surgeon: Kerin Salen, MD;  Location: [facility] ENDOSCOPY;  Service: Gastroenterology;  Laterality: N/A;   ESOPHAGOGASTRODUODENOSCOPY (EGD) WITH PROPOFOL N/A  10/01/2017   Procedure: ESOPHAGOGASTRODUODENOSCOPY (EGD) WITH PROPOFOL;  Surgeon: Kerin Salen, MD;  Location: [facility] ENDOSCOPY;  Service: Gastroenterology;  Laterality: N/A;   ESOPHAGOGASTRODUODENOSCOPY (EGD) WITH PROPOFOL N/A 10/08/2017   Procedure: ESOPHAGOGASTRODUODENOSCOPY (EGD) WITH PROPOFOL;  Surgeon: Kathi Der, MD;  Location: [facility] ENDOSCOPY;  Service: Gastroenterology;  Laterality: N/A;   ESOPHAGOGASTRODUODENOSCOPY (EGD) WITH PROPOFOL N/A 10/17/2017   Procedure: ESOPHAGOGASTRODUODENOSCOPY (EGD) WITH PROPOFOL;  Surgeon: Kathi Der, MD;  Location: [facility] ENDOSCOPY;  Service: Gastroenterology;  Laterality: N/A;   ESOPHAGOGASTRODUODENOSCOPY (EGD) WITH PROPOFOL N/A 10/19/2017   Procedure: ESOPHAGOGASTRODUODENOSCOPY (EGD) WITH PROPOFOL;  Surgeon: Kathi Der, MD;  Location: [facility] ENDOSCOPY;  Service: Gastroenterology;  Laterality: N/A;   ESOPHAGOGASTRODUODENOSCOPY (EGD) WITH PROPOFOL N/A 12/04/2018   Procedure: ESOPHAGOGASTRODUODENOSCOPY (EGD) WITH PROPOFOL;  Surgeon: Charlott Rakes, MD;  Location: [facility] ENDOSCOPY;  Service: Endoscopy;  Laterality: N/A;   GIVENS CAPSULE STUDY N/A 10/02/2017   Procedure: GIVENS CAPSULE STUDY;  Surgeon: Kerin Salen, MD;  Location: [facility] ENDOSCOPY;  Service: Gastroenterology;  Laterality: N/A;   GIVENS CAPSULE STUDY N/A 10/08/2017   Procedure: GIVENS CAPSULE STUDY;  Surgeon: Kathi Der, MD;  Location: [facility] ENDOSCOPY;  Service: Gastroenterology;  Laterality: N/A;  endoscopic placement of capsule   GIVENS CAPSULE STUDY N/A 03/02/2020   Procedure: GIVENS CAPSULE STUDY;  Surgeon: Bernette Redbird, MD;  Location: [facility];  Service: Endoscopy;  Laterality: N/A;   HEMOSTASIS CLIP PLACEMENT  12/04/2018   Procedure: HEMOSTASIS CLIP PLACEMENT;  Surgeon: Charlott Rakes, MD;  Location: [facility] ENDOS[facility];  Service: Endoscopy;;   HEMOSTASIS CLIP PLACEMENT  05/27/2022   Procedure: HEMOSTASIS CLIP PLACEMENT;  Surgeon: Willis Modena, MD;  Location: [facility] ENDOSCOPY;  Service:  Gastroenterology;;   HEMOSTASIS CLIP PLACEMENT  09/27/2022   Procedure: HEMOSTASIS CLIP PLACEMENT;  Surgeon: Lynann Bologna, DO;  Location: [facility] ENDOSCOPY;  Service: Gastroenterology;;   HEMOSTASIS CONTROL  05/27/2022   Procedure: HEMOSTASIS CONTROL;  Surgeon: Willis Modena, MD;  Location: [facility]OSCOPY;  Service: Gastroenterology;;   HOT HEMOSTASIS N/A 04/27/2014   Procedure: HOT HEMOSTASIS (ARGON PLASMA COAGULATION/BICAP);  Surgeon: Florencia Reasons, MD;  Location: [facility] ENDOSCOPY;  Service: Endoscopy;  Laterality: N/A;   HOT HEMOSTASIS N/A 09/30/2017   Procedure: HOT HEMOSTASIS (ARGON PLASMA COAGULATION/BICAP);  Surgeon: Kerin Salen, MD;  Location: [facility] ENDOSCOPY;  Service: Gastroenterology;  Laterality: N/A;   HOT HEMOSTASIS N/A 10/01/2017   Procedure: HOT HEMOSTASIS (ARGON PLASMA COAGULATION/BICAP);  Surgeon: Kerin Salen, MD;  Location: [facility] ENDOSCOPY;  Service: Gastroenterology;  Laterality: N/A;   HOT HEMOSTASIS N/A 10/17/2017   Procedure: HOT HEMOSTASIS (ARGON PLASMA COAGULATION/BICAP);  Surgeon: Kathi Der, MD;  Location: [facility] ENDOSCOPY;  Service: Gastroenterology;  Laterality: N/A;   HOT HEMOSTASIS N/A 10/19/2017   Procedure: HOT HEMOSTASIS (ARGON PLASMA COAGULATION/BICAP);  Surgeon: Kathi Der, MD;  Location: [facility] ENDOSCOPY;  Service: Gastroenterology;  Laterality: N/A;   HOT HEMOSTASIS N/A 03/02/2020   Procedure: HOT HEMOSTASIS (ARGON PLASMA COAGULATION/BICAP);  Surgeon: Bernette Redbird, MD;  Location: [facility] ENDOSCOPY;  Service: Endoscopy;  Laterality: N/A;   HOT HEMOSTASIS N/A 04/12/2022   Procedure: HOT HEMOSTASIS (ARGON PLASMA COAGULATION/BICAP);  Surgeon: Vida Rigger, MD;  Location: [facility] ENDOSCOPY;  Service: Gastroenterology;  Laterality: N/A;   IR IMAGING GUIDED PORT INSERTION  07/08/2018   L shoulder Surgery  2011   POLYPECTOMY  03/02/2020   Procedure: POLYPECTOMY;  Surgeon: Bernette Redbird, MD;  Location: [facility] ENDOSCOPY;  Service: Endoscopy;;   Susa Day  11/03/2020   Procedure:  Susa Day;  Surgeon: Charlott Rakes, MD;  Location:  [facility] ENDOSCOPY;  Service: Endoscopy;;   SUBMUCOSAL INJECTION  09/22/2017   Procedure: SUBMUCOSAL INJECTION;  Surgeon: Vida Rigger, MD;  Location: [facility] ENDOSCOPY;  Service: Endoscopy;;   SUBMUCOSAL INJECTION  12/04/2018   Procedure: SUBMUCOSAL INJECTION;  Surgeon: Charlott Rakes, MD;  Location: [facility] ENDOSCOPY;  Service: Endoscopy;;    I have reviewed the social history and family history with the patient and they are unchanged from previous note.  ALLERGIES:  is allergic to feraheme [ferumoxytol], nsaids, tomato, iron (ferrous sulfate) [ferrous sulfate er], and wasp venom.  MEDICATIONS:  Current Outpatient Medications  Medication Sig Dispense Refill   Accu-Chek Softclix Lancets lancets Use to check blood sugar before breakfast and before dinner while on steroids (Patient taking differently: 1 each by Other route See admin instructions. Use to check blood sugar before breakfast and before dinner while on steroids) 100 each 1   ALPRAZolam (XANAX) 0.5 MG tablet TAKE 1 TABLET BY MOUTH AT BEDTIME AS NEEDED FOR ANXIETY OR SLEEP 7 tablet 0   aminocaproic acid (AMICAR) 500 MG tablet Take 2 tablets (1,000 mg total) by mouth every 12 (twelve) hours. 120 tablet 3   amLODipine (NORVASC) 10 MG tablet TAKE 1 TABLET(10 MG) BY MOUTH DAILY (Patient taking differently: Take 10 mg by mouth daily.) 90 tablet 3   citalopram (CELEXA) 20 MG tablet Take 1 tablet (20 mg total) by mouth daily. 90 tablet 3   glucose blood (ACCU-CHEK GUIDE) test strip Check blood sugar 2 times per day while on steroids before breakfast and dinner (Patient taking differently: 1 each by Other route See admin instructions. Check blood sugar 2 times per day while on steroids before breakfast and dinner) 50 each 3   lisinopril (ZESTRIL) 20 MG tablet Take 20 mg by mouth daily.     metFORMIN (GLUCOPHAGE) 500 MG tablet TAKE 1 TABLET(500 MG) BY MOUTH TWICE DAILY WITH A MEAL 180 tablet 3    mirtazapine (REMERON) 7.5 MG tablet Take 1 tablet (7.5 mg total) by mouth at bedtime. 30 tablet 1   nicotine (NICODERM CQ - DOSED IN MG/24 HOURS) 14 mg/24hr patch Place 1 patch (14 mg total) onto the skin daily. (Patient taking differently: Place 14 mg onto the skin daily.) 30 patch 3   ondansetron (ZOFRAN) 4 MG tablet Take 1 tablet (4 mg total) by mouth every 8 (eight) hours as needed for nausea or vomiting. 30 tablet 0   pantoprazole (PROTONIX) 40 MG tablet TAKE 1 TABLET(40 MG) BY MOUTH TWICE DAILY (Patient taking differently: Take 40 mg by mouth daily.) 180 tablet 1   PROAIR HFA 108 (90 Base) MCG/ACT inhaler INHALE 1 TO 2 PUFFS INTO THE LUNGS EVERY 6 HOURS AS NEEDED FOR WHEEZING OR SHORTNESS OF BREATH (Patient taking differently: Inhale 1-2 puffs into the lungs every 6 (six) hours as needed for wheezing or shortness of breath.) 8.5 g 0   traZODone (DESYREL) 100 MG tablet TAKE 1 TABLET BY MOUTH AT BEDTIME 7 tablet 0   TYLENOL PM EXTRA STRENGTH 500-25 MG TABS tablet Take 2 tablets by mouth at bedtime as needed (for sleep, knee pain).     No current facility-administered medications for this visit.   Facility-Administered Medications Ordered in Other Visits  Medication Dose Route Frequency Provider Last Rate Last Admin   0.9 %  sodium chloride infusion (Manually program via Guardrails IV Fluids)  250 mL Intravenous Continuous Malachy Mood, MD       diphenhydrAMINE (BENADRYL) 25 mg capsule  diphenhydrAMINE (BENADRYL) 25 mg capsule            ferric gluconate (FERRLECIT) 250 mg in sodium chloride 0.9 % 250 mL IVPB  250 mg Intravenous Once Malachy Mood, MD 135 mL/hr at 03/18/23 1009 250 mg at 03/18/23 1009   heparin lock flush 100 unit/mL  500 Units Intracatheter Once Malachy Mood, MD       phenylephrine (NEO-SYNEPHRINE) 1 % nasal drops 2 drop  2 drop Left Nare Once Malachy Mood, MD       sodium chloride flush (NS) 0.9 % injection 10 mL  10 mL Intracatheter Once Malachy Mood, MD        PHYSICAL  EXAMINATION: ECOG PERFORMANCE STATUS: 2 - Symptomatic, <50% confined to bed  There were no vitals filed for this visit. Wt Readings from Last 3 Encounters:  03/18/23 224 lb (101.6 kg)  03/07/23 245 lb (111.1 kg)  02/21/23 238 lb (108 kg)     GENERAL:alert, no distress and comfortable SKIN: skin color, texture, turgor are normal, no rashes or significant lesions EYES: normal, Conjunctiva are pink and non-injected, sclera clear Musculoskeletal:no cyanosis of digits and no clubbing  NEURO: alert & oriented x 3 with fluent speech, no focal motor/sensory deficits       LABORATORY DATA:  I have reviewed the data as listed    Latest Ref Rng & Units 03/18/2023    9:29 AM 03/07/2023   12:49 PM 02/21/2023   10:59 AM  CBC  WBC 4.0 - 10.5 K/uL 8.4  10.2  9.2   Hemoglobin 12.0 - 15.0 g/dL 5.1  5.7  7.9   Hematocrit 36.0 - 46.0 % 17.8  19.7  26.8   Platelets 150 - 400 K/uL 253  271  273         Latest Ref Rng & Units 03/07/2023   12:52 PM 02/14/2023   10:29 AM 02/06/2023   10:12 AM  CMP  Glucose 70 - 99 mg/dL 161  096  96   BUN 8 - 23 mg/dL 9  6  9    Creatinine 0.44 - 1.00 mg/dL 0.45  4.09  8.11   Sodium 135 - 145 mmol/L 140  142  141   Potassium 3.5 - 5.1 mmol/L 3.7  3.5  3.5   Chloride 98 - 111 mmol/L 111  111  112   CO2 22 - 32 mmol/L 25  25  22    Calcium 8.9 - 10.3 mg/dL 8.6  8.8  8.5   Total Protein 6.5 - 8.1 g/dL 6.4  6.6  6.7   Total Bilirubin <1.2 mg/dL 0.2  0.3  0.4   Alkaline Phos 38 - 126 U/L 59  61  58   AST 15 - 41 U/L 10  10  13    ALT 0 - 44 U/L <5  <5  8       RADIOGRAPHIC STUDIES: I have personally reviewed the radiological images as listed and agreed with the findings in the report. No results found.    No orders of the defined types were placed in this encounter.  All questions were answered. The patient knows to call the clinic with any problems, questions or concerns. No barriers to learning was detected. The total time spent in the appointment was  25 minutes.     Malachy Mood, MD 03/18/2023

## 2023-03-18 NOTE — Progress Notes (Signed)
Per Dr. Mosetta Putt, ok to run packed red blood cells at 300 cc/hr.

## 2023-03-18 NOTE — Assessment & Plan Note (Signed)
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

## 2023-03-18 NOTE — Patient Instructions (Addendum)
 Sodium Ferric Gluconate Complex Injection What is this medication? SODIUM FERRIC GLUCONATE COMPLEX (SOE dee um FER ik GLOO koe nate KOM pleks) treats low levels of iron (iron deficiency anemia) in people with kidney disease. Iron is a mineral that plays an important role in making red blood cells, which carry oxygen from your lungs to the rest of your body. This medicine may be used for other purposes; ask your health care provider or pharmacist if you have questions. COMMON BRAND NAME(S): Ferrlecit, Nulecit What should I tell my care team before I take this medication? They need to know if you have any of the following conditions: Anemia that is not from iron deficiency High levels of iron in the blood An unusual or allergic reaction to iron, other medications, foods, dyes, or preservatives Pregnant or are trying to become pregnant Breast-feeding How should I use this medication? This medication is injected into a vein. It is given by your care team in a hospital or clinic setting. Talk to your care team about the use of this medication in children. While it may be prescribed for children as young as 6 years for selected conditions, precautions do apply. Overdosage: If you think you have taken too much of this medicine contact a poison control center or emergency room at once. NOTE: This medicine is only for you. Do not share this medicine with others. What if I miss a dose? It is important not to miss your dose. Call your care team if you are unable to keep an appointment. What may interact with this medication? Do not take this medication with any of the following: Deferasirox Deferoxamine Dimercaprol This medication may also interact with the following: Other iron products This list may not describe all possible interactions. Give your health care provider a list of all the medicines, herbs, non-prescription drugs, or dietary supplements you use. Also tell them if you smoke, drink  alcohol, or use illegal drugs. Some items may interact with your medicine. What should I watch for while using this medication? Your condition will be monitored carefully while you are receiving this medication. Visit your care team for regular checks on your progress. You may need blood work while you are taking this medication. What side effects may I notice from receiving this medication? Side effects that you should report to your care team as soon as possible: Allergic reactions--skin rash, itching, hives, swelling of the face, lips, tongue, or throat Low blood pressure--dizziness, feeling faint or lightheaded, blurry vision Shortness of breath Side effects that usually do not require medical attention (report to your care team if they continue or are bothersome): Flushing Headache Joint pain Muscle pain Nausea Pain, redness, or irritation at injection site This list may not describe all possible side effects. Call your doctor for medical advice about side effects. You may report side effects to FDA at 1-800-FDA-1088. Where should I keep my medication? This medication is given in a hospital or clinic and will not be stored at home. NOTE: This sheet is a summary. It may not cover all possible information. If you have questions about this medicine, talk to your doctor, pharmacist, or health care provider.  2024 Elsevier/Gold Standard (2020-08-25 00:00:00) Blood Transfusion, Adult, Care After After a blood transfusion, it is common to have: Bruising and soreness at the IV site. A headache. Follow these instructions at home: Your doctor may give you more instructions. If you have problems, contact your doctor. Insertion site care     Follow  instructions from your doctor about how to take care of your insertion site. This is where an IV tube was put into your vein. Make sure you: Wash your hands with soap and water for at least 20 seconds before and after you change your bandage. If  you cannot use soap and water, use hand sanitizer. Change your bandage as told by your doctor. Check your insertion site every day for signs of infection. Check for: Redness, swelling, or pain. Bleeding from the site. Warmth. Pus or a bad smell. General instructions Take over-the-counter and prescription medicines only as told by your doctor. Rest as told by your doctor. Go back to your normal activities as told by your doctor. Keep all follow-up visits. You may need to have tests at certain times to check your blood. Contact a doctor if: You have itching or red, swollen areas of skin (hives). You have a fever or chills. You have pain in the head, back, or chest. You feel worried or nervous (anxious). You feel weak after doing your normal activities. You have any of these problems at the insertion site: Redness, swelling, warmth, or pain. Bleeding that does not stop with pressure. Pus or a bad smell. If you received your blood transfusion in an outpatient setting, you will be told whom to contact to report any reactions. Get help right away if: You have signs of a serious reaction. This may be coming from an allergy or the body's defense system (immune system). Signs include: Trouble breathing or shortness of breath. Swelling of the face or feeling warm (flushed). A widespread rash. Dark pee (urine) or blood in the pee. Fast heartbeat. These symptoms may be an emergency. Get help right away. Call 911. Do not wait to see if the symptoms will go away. Do not drive yourself to the hospital. Summary Bruising and soreness at the IV site are common. Check your insertion site every day for signs of infection. Rest as told by your doctor. Go back to your normal activities as told by your doctor. Get help right away if you have signs of a serious reaction. This information is not intended to replace advice given to you by your health care provider. Make sure you discuss any questions you  have with your health care provider. Document Revised: 06/29/2021 Document Reviewed: 06/29/2021 Elsevier Patient Education  2024 [employer].

## 2023-03-18 NOTE — Assessment & Plan Note (Signed)
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 -she has required multiple blood transfusions and APCs since 2019. 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 -She had multiple prolonged hospital stay due to severe GI bleeding.   -s/p IR embolization of gastric artery branch vessels on 12/10/17, cauterization of stomach for severe GI bleed in 11/2018, and epinephrine injection for bleeding ulcer on 11/03/20. -She has required frequent blood transfusion, IV iron and bevacizumab -She does respond to treatment, anemia improved with IV iron and bevacizumab, but she is not compliant with her appointments.

## 2023-03-18 NOTE — Progress Notes (Signed)
Verbal order given to administer 1 unit of PRBC form Dr. Mosetta Putt spoke with Ester in blood bank type and screen order received patient will have 1 unit  PRBC transfused on 03/18/2023

## 2023-03-19 ENCOUNTER — Other Ambulatory Visit: Payer: Self-pay

## 2023-03-19 ENCOUNTER — Other Ambulatory Visit: Payer: Self-pay | Admitting: Hematology

## 2023-03-19 DIAGNOSIS — D500 Iron deficiency anemia secondary to blood loss (chronic): Secondary | ICD-10-CM

## 2023-03-19 DIAGNOSIS — F331 Major depressive disorder, recurrent, moderate: Secondary | ICD-10-CM

## 2023-03-19 NOTE — Progress Notes (Signed)
Verbal order given to administer 1 unit of PRBC from Dr. Mosetta Putt spoke with Jon Gills in blood bank type and screen order received patient will have 1 unit of PRBC transfused on 03/20/2023

## 2023-03-20 ENCOUNTER — Inpatient Hospital Stay: Payer: Medicaid Other

## 2023-03-20 ENCOUNTER — Ambulatory Visit: Payer: Medicaid Other

## 2023-03-20 VITALS — BP 189/79 | HR 65 | Temp 98.6°F | Resp 18 | Wt 237.4 lb

## 2023-03-20 DIAGNOSIS — Z95828 Presence of other vascular implants and grafts: Secondary | ICD-10-CM

## 2023-03-20 DIAGNOSIS — Z5112 Encounter for antineoplastic immunotherapy: Secondary | ICD-10-CM | POA: Diagnosis not present

## 2023-03-20 DIAGNOSIS — D500 Iron deficiency anemia secondary to blood loss (chronic): Secondary | ICD-10-CM

## 2023-03-20 MED ORDER — HEPARIN SOD (PORK) LOCK FLUSH 100 UNIT/ML IV SOLN
500.0000 [IU] | Freq: Once | INTRAVENOUS | Status: AC
Start: 2023-03-20 — End: 2023-03-20
  Administered 2023-03-20: 500 [IU]

## 2023-03-20 MED ORDER — ACETAMINOPHEN 325 MG PO TABS
650.0000 mg | ORAL_TABLET | Freq: Once | ORAL | Status: AC
  Administered 2023-03-20: 650 mg via ORAL
  Filled 2023-03-20: qty 2

## 2023-03-20 MED ORDER — SODIUM CHLORIDE 0.9% IV SOLUTION
250.0000 mL | INTRAVENOUS | Status: DC
  Administered 2023-03-20: 100 mL via INTRAVENOUS

## 2023-03-20 MED ORDER — SODIUM CHLORIDE 0.9% FLUSH
10.0000 mL | Freq: Once | INTRAVENOUS | Status: AC
  Administered 2023-03-20: 10 mL

## 2023-03-20 MED ORDER — DIPHENHYDRAMINE HCL 25 MG PO CAPS
25.0000 mg | ORAL_CAPSULE | Freq: Once | ORAL | Status: AC
  Administered 2023-03-20: 25 mg via ORAL
  Filled 2023-03-20: qty 1

## 2023-03-21 LAB — BPAM RBC
Blood Product Expiration Date: 202412232359
Blood Product Expiration Date: 202412252359
ISSUE DATE / TIME: 202412031244
ISSUE DATE / TIME: 202412051211
Unit Type and Rh: 600
Unit Type and Rh: 600

## 2023-03-21 LAB — TYPE AND SCREEN
ABO/RH(D): A NEG
Antibody Screen: NEGATIVE
Unit division: 0
Unit division: 0

## 2023-03-28 ENCOUNTER — Telehealth: Payer: Self-pay

## 2023-03-28 ENCOUNTER — Inpatient Hospital Stay: Payer: Medicaid Other

## 2023-03-28 NOTE — Telephone Encounter (Signed)
Spoke pt via telephone regarding her appts today for lab and blood transfusion.  Pt stated she didn't realized she was scheduled for an infusion today.  Informed pt that she's scheduled every Friday for blood and an infusion.  Pt verbalized understanding and apologized for missing her appts today.

## 2023-04-04 ENCOUNTER — Inpatient Hospital Stay: Payer: Medicaid Other

## 2023-04-04 ENCOUNTER — Telehealth: Payer: Self-pay

## 2023-04-04 ENCOUNTER — Other Ambulatory Visit: Payer: Self-pay

## 2023-04-04 VITALS — BP 163/78 | HR 76 | Temp 98.7°F | Resp 18 | Wt 229.5 lb

## 2023-04-04 DIAGNOSIS — I780 Hereditary hemorrhagic telangiectasia: Secondary | ICD-10-CM

## 2023-04-04 DIAGNOSIS — Z95828 Presence of other vascular implants and grafts: Secondary | ICD-10-CM

## 2023-04-04 DIAGNOSIS — D500 Iron deficiency anemia secondary to blood loss (chronic): Secondary | ICD-10-CM

## 2023-04-04 DIAGNOSIS — Z5112 Encounter for antineoplastic immunotherapy: Secondary | ICD-10-CM | POA: Diagnosis not present

## 2023-04-04 LAB — CMP (CANCER CENTER ONLY)
ALT: <5 U/L (ref 0–44)
AST: 9 U/L — ABNORMAL LOW (ref 15–41)
Albumin: 3.7 g/dL (ref 3.5–5.0)
Alkaline Phosphatase: 61 U/L (ref 38–126)
Anion gap: 7 (ref 5–15)
BUN: 9 mg/dL (ref 8–23)
CO2: 25 mmol/L (ref 22–32)
Calcium: 9 mg/dL (ref 8.9–10.3)
Chloride: 109 mmol/L (ref 98–111)
Creatinine: 0.89 mg/dL (ref 0.44–1.00)
GFR, Estimated: >60 mL/min (ref >60)
Glucose, Bld: 102 mg/dL — ABNORMAL HIGH (ref 70–99)
Potassium: 3.6 mmol/L (ref 3.5–5.1)
Sodium: 141 mmol/L (ref 135–145)
Total Bilirubin: 0.3 mg/dL (ref <1.2)
Total Protein: 6.3 g/dL — ABNORMAL LOW (ref 6.5–8.1)

## 2023-04-04 LAB — CBC WITH DIFFERENTIAL (CANCER CENTER ONLY)
Abs Immature Granulocytes: 0.02 10*3/uL (ref 0.00–0.07)
Basophils Absolute: 0 10*3/uL (ref 0.0–0.1)
Basophils Relative: 0 %
Eosinophils Absolute: 0.1 10*3/uL (ref 0.0–0.5)
Eosinophils Relative: 1 %
HCT: 20.3 % — ABNORMAL LOW (ref 36.0–46.0)
Hemoglobin: 6 g/dL — CL (ref 12.0–15.0)
Immature Granulocytes: 0 %
Lymphocytes Relative: 12 %
Lymphs Abs: 1.1 10*3/uL (ref 0.7–4.0)
MCH: 24.4 pg — ABNORMAL LOW (ref 26.0–34.0)
MCHC: 29.6 g/dL — ABNORMAL LOW (ref 30.0–36.0)
MCV: 82.5 fL (ref 80.0–100.0)
Monocytes Absolute: 0.5 10*3/uL (ref 0.1–1.0)
Monocytes Relative: 6 %
Neutro Abs: 7.6 10*3/uL (ref 1.7–7.7)
Neutrophils Relative %: 81 %
Platelet Count: 458 10*3/uL — ABNORMAL HIGH (ref 150–400)
RBC: 2.46 MIL/uL — ABNORMAL LOW (ref 3.87–5.11)
RDW: 21.3 % — ABNORMAL HIGH (ref 11.5–15.5)
WBC Count: 9.3 10*3/uL (ref 4.0–10.5)
nRBC: 0 % (ref 0.0–0.2)

## 2023-04-04 LAB — SAMPLE TO BLOOD BANK

## 2023-04-04 LAB — FERRITIN: Ferritin: 15 ng/mL (ref 11–307)

## 2023-04-04 LAB — PREPARE RBC (CROSSMATCH)

## 2023-04-04 LAB — TOTAL PROTEIN, URINE DIPSTICK: Protein, ur: 30 mg/dL — AB

## 2023-04-04 MED ORDER — SODIUM CHLORIDE 0.9% FLUSH
10.0000 mL | Freq: Once | INTRAVENOUS | Status: AC
  Administered 2023-04-04: 10 mL

## 2023-04-04 MED ORDER — SODIUM CHLORIDE 0.9 % IV SOLN
250.0000 mg | Freq: Once | INTRAVENOUS | Status: AC
  Administered 2023-04-04: 250 mg via INTRAVENOUS
  Filled 2023-04-04: qty 20

## 2023-04-04 MED ORDER — SODIUM CHLORIDE 0.9% FLUSH
10.0000 mL | INTRAVENOUS | Status: DC | PRN
  Administered 2023-04-04: 10 mL

## 2023-04-04 MED ORDER — SODIUM CHLORIDE 0.9 % IV SOLN
Freq: Once | INTRAVENOUS | Status: AC
Start: 2023-04-04 — End: 2023-04-04

## 2023-04-04 MED ORDER — HEPARIN SOD (PORK) LOCK FLUSH 100 UNIT/ML IV SOLN
500.0000 [IU] | Freq: Once | INTRAVENOUS | Status: AC | PRN
Start: 2023-04-04 — End: 2023-04-04
  Administered 2023-04-04: 500 [IU]

## 2023-04-04 MED ORDER — DIPHENHYDRAMINE HCL 25 MG PO CAPS
25.0000 mg | ORAL_CAPSULE | Freq: Once | ORAL | Status: AC
  Administered 2023-04-04: 25 mg via ORAL
  Filled 2023-04-04: qty 1

## 2023-04-04 MED ORDER — SODIUM CHLORIDE 0.9 % IV SOLN
5.0000 mg/kg | Freq: Once | INTRAVENOUS | Status: AC
  Administered 2023-04-04: 500 mg via INTRAVENOUS
  Filled 2023-04-04: qty 4

## 2023-04-04 MED ORDER — ACETAMINOPHEN 325 MG PO TABS
650.0000 mg | ORAL_TABLET | Freq: Once | ORAL | Status: AC
  Administered 2023-04-04: 650 mg via ORAL
  Filled 2023-04-04: qty 2

## 2023-04-04 NOTE — Telephone Encounter (Signed)
CRITICAL VALUE STICKER  CRITICAL VALUE:   Hgb 6.0  RECEIVER (on-site recipient of call): Daneil Dolin, LPN  DATE & TIME NOTIFIED: 11:48  04/04/23   MESSENGER (representative from lab): Lanora Manis  MD NOTIFIED: Vincent Gros, NP  TIME OF NOTIFICATION: 11:48  RESPONSE:

## 2023-04-04 NOTE — Progress Notes (Signed)
Patient declined second unit of blood. This RN made patient aware of her hemoglobin and has been educated on need for blood today. Patient stated understanding and politely declined.

## 2023-04-04 NOTE — Progress Notes (Signed)
Verbal order given to administer 1 unit of PRBC from Dr. Mosetta Putt spoke with Tresa Endo in BB type and screen order received patient will have 1 unit of PRBC transfused on [id number]

## 2023-04-06 LAB — TYPE AND SCREEN
ABO/RH(D): A NEG
Antibody Screen: NEGATIVE
Unit division: 0

## 2023-04-06 LAB — BPAM RBC
Blood Product Expiration Date: 202501162359
ISSUE DATE / TIME: 202412201413
Unit Type and Rh: 600

## 2023-04-13 ENCOUNTER — Observation Stay (HOSPITAL_COMMUNITY): Payer: Medicaid Other

## 2023-04-13 ENCOUNTER — Other Ambulatory Visit (HOSPITAL_COMMUNITY): Payer: Medicaid Other

## 2023-04-13 ENCOUNTER — Inpatient Hospital Stay (HOSPITAL_COMMUNITY)
Admission: EM | Admit: 2023-04-13 | Discharge: 2023-05-02 | DRG: 493 | Disposition: A | Discharge status: Rehabilitation Facility | Payer: Medicaid Other | Attending: Internal Medicine | Admitting: Internal Medicine

## 2023-04-13 ENCOUNTER — Other Ambulatory Visit: Payer: Self-pay

## 2023-04-13 ENCOUNTER — Encounter (HOSPITAL_COMMUNITY): Payer: Self-pay | Admitting: Emergency Medicine

## 2023-04-13 DIAGNOSIS — D509 Iron deficiency anemia, unspecified: Secondary | ICD-10-CM | POA: Diagnosis not present

## 2023-04-13 DIAGNOSIS — Z5941 Food insecurity: Secondary | ICD-10-CM

## 2023-04-13 DIAGNOSIS — R55 Syncope and collapse: Secondary | ICD-10-CM | POA: Diagnosis not present

## 2023-04-13 DIAGNOSIS — Z91018 Allergy to other foods: Secondary | ICD-10-CM

## 2023-04-13 DIAGNOSIS — W19XXXA Unspecified fall, initial encounter: Secondary | ICD-10-CM | POA: Diagnosis present

## 2023-04-13 DIAGNOSIS — D649 Anemia, unspecified: Principal | ICD-10-CM

## 2023-04-13 DIAGNOSIS — Z471 Aftercare following joint replacement surgery: Secondary | ICD-10-CM | POA: Diagnosis not present

## 2023-04-13 DIAGNOSIS — M7989 Other specified soft tissue disorders: Secondary | ICD-10-CM | POA: Diagnosis present

## 2023-04-13 DIAGNOSIS — N179 Acute kidney failure, unspecified: Secondary | ICD-10-CM | POA: Diagnosis present

## 2023-04-13 DIAGNOSIS — S80919A Unspecified superficial injury of unspecified knee, initial encounter: Secondary | ICD-10-CM | POA: Diagnosis not present

## 2023-04-13 DIAGNOSIS — Z888 Allergy status to other drugs, medicaments and biological substances status: Secondary | ICD-10-CM

## 2023-04-13 DIAGNOSIS — E66813 Obesity, class 3: Secondary | ICD-10-CM | POA: Diagnosis present

## 2023-04-13 DIAGNOSIS — F32A Depression, unspecified: Secondary | ICD-10-CM | POA: Diagnosis present

## 2023-04-13 DIAGNOSIS — I780 Hereditary hemorrhagic telangiectasia: Secondary | ICD-10-CM | POA: Diagnosis present

## 2023-04-13 DIAGNOSIS — J069 Acute upper respiratory infection, unspecified: Secondary | ICD-10-CM | POA: Diagnosis present

## 2023-04-13 DIAGNOSIS — R001 Bradycardia, unspecified: Secondary | ICD-10-CM | POA: Diagnosis present

## 2023-04-13 DIAGNOSIS — E876 Hypokalemia: Secondary | ICD-10-CM | POA: Diagnosis present

## 2023-04-13 DIAGNOSIS — S82891A Other fracture of right lower leg, initial encounter for closed fracture: Secondary | ICD-10-CM | POA: Diagnosis not present

## 2023-04-13 DIAGNOSIS — F419 Anxiety disorder, unspecified: Secondary | ICD-10-CM | POA: Diagnosis present

## 2023-04-13 DIAGNOSIS — R9431 Abnormal electrocardiogram [ECG] [EKG]: Secondary | ICD-10-CM | POA: Diagnosis not present

## 2023-04-13 DIAGNOSIS — F411 Generalized anxiety disorder: Secondary | ICD-10-CM | POA: Diagnosis present

## 2023-04-13 DIAGNOSIS — Z91038 Other insect allergy status: Secondary | ICD-10-CM

## 2023-04-13 DIAGNOSIS — S82891D Other fracture of right lower leg, subsequent encounter for closed fracture with routine healing: Secondary | ICD-10-CM | POA: Diagnosis not present

## 2023-04-13 DIAGNOSIS — Z833 Family history of diabetes mellitus: Secondary | ICD-10-CM

## 2023-04-13 DIAGNOSIS — M170 Bilateral primary osteoarthritis of knee: Secondary | ICD-10-CM | POA: Diagnosis present

## 2023-04-13 DIAGNOSIS — M25571 Pain in right ankle and joints of right foot: Secondary | ICD-10-CM | POA: Diagnosis not present

## 2023-04-13 DIAGNOSIS — R42 Dizziness and giddiness: Secondary | ICD-10-CM | POA: Diagnosis not present

## 2023-04-13 DIAGNOSIS — I10 Essential (primary) hypertension: Secondary | ICD-10-CM | POA: Diagnosis present

## 2023-04-13 DIAGNOSIS — E1159 Type 2 diabetes mellitus with other circulatory complications: Secondary | ICD-10-CM | POA: Diagnosis present

## 2023-04-13 DIAGNOSIS — G238 Other specified degenerative diseases of basal ganglia: Secondary | ICD-10-CM | POA: Diagnosis not present

## 2023-04-13 DIAGNOSIS — Z6838 Body mass index (BMI) 38.0-38.9, adult: Secondary | ICD-10-CM

## 2023-04-13 DIAGNOSIS — S8261XA Displaced fracture of lateral malleolus of right fibula, initial encounter for closed fracture: Secondary | ICD-10-CM | POA: Diagnosis not present

## 2023-04-13 DIAGNOSIS — E1151 Type 2 diabetes mellitus with diabetic peripheral angiopathy without gangrene: Secondary | ICD-10-CM | POA: Diagnosis present

## 2023-04-13 DIAGNOSIS — E785 Hyperlipidemia, unspecified: Secondary | ICD-10-CM | POA: Diagnosis present

## 2023-04-13 DIAGNOSIS — F17210 Nicotine dependence, cigarettes, uncomplicated: Secondary | ICD-10-CM | POA: Diagnosis present

## 2023-04-13 DIAGNOSIS — Z5982 Transportation insecurity: Secondary | ICD-10-CM

## 2023-04-13 DIAGNOSIS — R011 Cardiac murmur, unspecified: Secondary | ICD-10-CM | POA: Diagnosis present

## 2023-04-13 DIAGNOSIS — E119 Type 2 diabetes mellitus without complications: Secondary | ICD-10-CM | POA: Diagnosis present

## 2023-04-13 DIAGNOSIS — S82851A Displaced trimalleolar fracture of right lower leg, initial encounter for closed fracture: Principal | ICD-10-CM | POA: Diagnosis present

## 2023-04-13 DIAGNOSIS — G8929 Other chronic pain: Secondary | ICD-10-CM | POA: Diagnosis present

## 2023-04-13 DIAGNOSIS — K219 Gastro-esophageal reflux disease without esophagitis: Secondary | ICD-10-CM | POA: Diagnosis present

## 2023-04-13 DIAGNOSIS — D696 Thrombocytopenia, unspecified: Secondary | ICD-10-CM | POA: Diagnosis not present

## 2023-04-13 DIAGNOSIS — Z79899 Other long term (current) drug therapy: Secondary | ICD-10-CM

## 2023-04-13 DIAGNOSIS — L89322 Pressure ulcer of left buttock, stage 2: Secondary | ICD-10-CM | POA: Diagnosis not present

## 2023-04-13 DIAGNOSIS — Z6841 Body Mass Index (BMI) 40.0 and over, adult: Secondary | ICD-10-CM

## 2023-04-13 DIAGNOSIS — F17200 Nicotine dependence, unspecified, uncomplicated: Secondary | ICD-10-CM | POA: Diagnosis present

## 2023-04-13 DIAGNOSIS — D62 Acute posthemorrhagic anemia: Secondary | ICD-10-CM | POA: Diagnosis present

## 2023-04-13 DIAGNOSIS — R059 Cough, unspecified: Secondary | ICD-10-CM | POA: Diagnosis present

## 2023-04-13 DIAGNOSIS — G4700 Insomnia, unspecified: Secondary | ICD-10-CM | POA: Diagnosis present

## 2023-04-13 DIAGNOSIS — Z66 Do not resuscitate: Secondary | ICD-10-CM | POA: Diagnosis present

## 2023-04-13 DIAGNOSIS — S8251XA Displaced fracture of medial malleolus of right tibia, initial encounter for closed fracture: Secondary | ICD-10-CM | POA: Diagnosis not present

## 2023-04-13 DIAGNOSIS — E1165 Type 2 diabetes mellitus with hyperglycemia: Secondary | ICD-10-CM | POA: Diagnosis present

## 2023-04-13 DIAGNOSIS — I951 Orthostatic hypotension: Secondary | ICD-10-CM | POA: Diagnosis present

## 2023-04-13 DIAGNOSIS — R040 Epistaxis: Secondary | ICD-10-CM | POA: Diagnosis present

## 2023-04-13 DIAGNOSIS — M25561 Pain in right knee: Secondary | ICD-10-CM | POA: Diagnosis not present

## 2023-04-13 DIAGNOSIS — Z86718 Personal history of other venous thrombosis and embolism: Secondary | ICD-10-CM

## 2023-04-13 DIAGNOSIS — K5903 Drug induced constipation: Secondary | ICD-10-CM | POA: Diagnosis not present

## 2023-04-13 DIAGNOSIS — Z7984 Long term (current) use of oral hypoglycemic drugs: Secondary | ICD-10-CM

## 2023-04-13 DIAGNOSIS — Z832 Family history of diseases of the blood and blood-forming organs and certain disorders involving the immune mechanism: Secondary | ICD-10-CM

## 2023-04-13 DIAGNOSIS — F331 Major depressive disorder, recurrent, moderate: Secondary | ICD-10-CM

## 2023-04-13 DIAGNOSIS — G9389 Other specified disorders of brain: Secondary | ICD-10-CM | POA: Diagnosis not present

## 2023-04-13 DIAGNOSIS — Z90710 Acquired absence of both cervix and uterus: Secondary | ICD-10-CM

## 2023-04-13 DIAGNOSIS — I959 Hypotension, unspecified: Secondary | ICD-10-CM | POA: Diagnosis not present

## 2023-04-13 LAB — CBC
HCT: 18 % — ABNORMAL LOW (ref 36.0–46.0)
Hemoglobin: 5.1 g/dL — CL (ref 12.0–15.0)
MCH: 25.1 pg — ABNORMAL LOW (ref 26.0–34.0)
MCHC: 28.3 g/dL — ABNORMAL LOW (ref 30.0–36.0)
MCV: 88.7 fL (ref 80.0–100.0)
Platelets: 201 10*3/uL (ref 150–400)
RBC: 2.03 MIL/uL — ABNORMAL LOW (ref 3.87–5.11)
RDW: 22.2 % — ABNORMAL HIGH (ref 11.5–15.5)
WBC: 14.5 10*3/uL — ABNORMAL HIGH (ref 4.0–10.5)
nRBC: 0 % (ref 0.0–0.2)

## 2023-04-13 LAB — COMPREHENSIVE METABOLIC PANEL
ALT: 8 U/L (ref 0–44)
AST: 15 U/L (ref 15–41)
Albumin: 2.7 g/dL — ABNORMAL LOW (ref 3.5–5.0)
Alkaline Phosphatase: 49 U/L (ref 38–126)
Anion gap: 8 (ref 5–15)
BUN: 8 mg/dL (ref 8–23)
CO2: 20 mmol/L — ABNORMAL LOW (ref 22–32)
Calcium: 8.6 mg/dL — ABNORMAL LOW (ref 8.9–10.3)
Chloride: 112 mmol/L — ABNORMAL HIGH (ref 98–111)
Creatinine, Ser: 1.18 mg/dL — ABNORMAL HIGH (ref 0.44–1.00)
GFR, Estimated: 52 mL/min — ABNORMAL LOW (ref >60)
Glucose, Bld: 142 mg/dL — ABNORMAL HIGH (ref 70–99)
Potassium: 3.2 mmol/L — ABNORMAL LOW (ref 3.5–5.1)
Sodium: 140 mmol/L (ref 135–145)
Total Bilirubin: <0.2 mg/dL (ref <1.2)
Total Protein: 5.4 g/dL — ABNORMAL LOW (ref 6.5–8.1)

## 2023-04-13 LAB — CBG MONITORING, ED
Glucose-Capillary: 110 mg/dL — ABNORMAL HIGH (ref 70–99)
Glucose-Capillary: 100 mg/dL — ABNORMAL HIGH (ref 70–99)

## 2023-04-13 LAB — PROTIME-INR
INR: 1.2 (ref 0.8–1.2)
Prothrombin Time: 15.1 s (ref 11.4–15.2)

## 2023-04-13 LAB — GLUCOSE, CAPILLARY
Glucose-Capillary: 89 mg/dL (ref 70–99)
Glucose-Capillary: 113 mg/dL — ABNORMAL HIGH (ref 70–99)

## 2023-04-13 LAB — MAGNESIUM: Magnesium: 1.9 mg/dL (ref 1.7–2.4)

## 2023-04-13 LAB — PREPARE RBC (CROSSMATCH)

## 2023-04-13 LAB — HEMOGLOBIN AND HEMATOCRIT, BLOOD
HCT: 24.1 % — ABNORMAL LOW (ref 36.0–46.0)
Hemoglobin: 7.3 g/dL — ABNORMAL LOW (ref 12.0–15.0)

## 2023-04-13 MED ORDER — POLYETHYLENE GLYCOL 3350 17 G PO PACK: 17.0000 g | PACK | Freq: Every day | ORAL | Status: DC | PRN

## 2023-04-13 MED ORDER — INSULIN ASPART 100 UNIT/ML IJ SOLN: 0.0000 [IU] | Freq: Every day | INTRAMUSCULAR | Status: DC

## 2023-04-13 MED ORDER — ACETAMINOPHEN 500 MG PO TABS
1000.0000 mg | ORAL_TABLET | Freq: Three times a day (TID) | ORAL | Status: DC
Start: 2023-04-13 — End: 2023-05-02
  Administered 2023-04-13 – 2023-05-02 (×56): 1000 mg via ORAL
  Filled 2023-04-13 (×56): qty 2

## 2023-04-13 MED ORDER — HYDROMORPHONE HCL 1 MG/ML IJ SOLN
0.5000 mg | Freq: Once | INTRAMUSCULAR | Status: AC
  Administered 2023-04-13: 0.5 mg via INTRAVENOUS
  Filled 2023-04-13: qty 1

## 2023-04-13 MED ORDER — CITALOPRAM HYDROBROMIDE 20 MG PO TABS
20.0000 mg | ORAL_TABLET | Freq: Every day | ORAL | Status: DC
  Administered 2023-04-13 – 2023-05-02 (×19): 20 mg via ORAL
  Filled 2023-04-13 (×11): qty 1
  Filled 2023-04-13: qty 2
  Filled 2023-04-13 (×7): qty 1

## 2023-04-13 MED ORDER — POTASSIUM CHLORIDE CRYS ER 20 MEQ PO TBCR
40.0000 meq | EXTENDED_RELEASE_TABLET | Freq: Once | ORAL | Status: AC
  Administered 2023-04-13: 40 meq via ORAL
  Filled 2023-04-13: qty 2

## 2023-04-13 MED ORDER — DICLOFENAC SODIUM 1 % EX GEL
4.0000 g | Freq: Three times a day (TID) | CUTANEOUS | Status: DC
  Administered 2023-04-13: 4 g via TOPICAL
  Filled 2023-04-13: qty 100

## 2023-04-13 MED ORDER — MIRTAZAPINE 15 MG PO TABS: 7.5000 mg | ORAL_TABLET | Freq: Every day | ORAL | Status: DC

## 2023-04-13 MED ORDER — TRAZODONE HCL 50 MG PO TABS
50.0000 mg | ORAL_TABLET | Freq: Every evening | ORAL | Status: DC | PRN
  Administered 2023-04-15 – 2023-05-01 (×12): 50 mg via ORAL
  Filled 2023-04-13 (×12): qty 1

## 2023-04-13 MED ORDER — INSULIN ASPART 100 UNIT/ML IJ SOLN: 0.0000 [IU] | Freq: Three times a day (TID) | INTRAMUSCULAR | Status: DC

## 2023-04-13 MED ORDER — ALBUTEROL SULFATE (2.5 MG/3ML) 0.083% IN NEBU: 2.5000 mg | INHALATION_SOLUTION | Freq: Four times a day (QID) | RESPIRATORY_TRACT | Status: DC | PRN

## 2023-04-13 MED ORDER — PANTOPRAZOLE SODIUM 40 MG PO TBEC: 40.0000 mg | DELAYED_RELEASE_TABLET | Freq: Every day | ORAL | Status: DC

## 2023-04-13 MED ORDER — OXYCODONE HCL 5 MG PO TABS
5.0000 mg | ORAL_TABLET | ORAL | Status: DC | PRN
  Administered 2023-04-13 – 2023-04-15 (×9): 5 mg via ORAL
  Filled 2023-04-13 (×9): qty 1

## 2023-04-13 MED ORDER — LIDOCAINE 1% INJECTION FOR CIRCUMCISION: 20.0000 mL | INJECTION | Freq: Once | INTRAVENOUS | Status: DC

## 2023-04-13 MED ORDER — ACETAMINOPHEN 500 MG PO TABS
1000.0000 mg | ORAL_TABLET | Freq: Once | ORAL | Status: AC
  Administered 2023-04-13: 1000 mg via ORAL
  Filled 2023-04-13: qty 2

## 2023-04-13 MED ORDER — SODIUM CHLORIDE 0.9% IV SOLUTION: Freq: Once | INTRAVENOUS | Status: AC

## 2023-04-13 MED ORDER — DIPHENHYDRAMINE HCL 25 MG PO CAPS
25.0000 mg | ORAL_CAPSULE | Freq: Once | ORAL | Status: AC
  Administered 2023-04-13: 25 mg via ORAL
  Filled 2023-04-13: qty 1

## 2023-04-13 MED ORDER — HYDROMORPHONE HCL 1 MG/ML IJ SOLN
0.5000 mg | INTRAMUSCULAR | Status: DC | PRN
  Administered 2023-04-13 – 2023-04-16 (×10): 0.5 mg via INTRAVENOUS
  Filled 2023-04-13 (×10): qty 0.5

## 2023-04-13 MED ORDER — PANTOPRAZOLE SODIUM 40 MG PO TBEC
40.0000 mg | DELAYED_RELEASE_TABLET | Freq: Two times a day (BID) | ORAL | Status: DC
  Administered 2023-04-13 – 2023-05-02 (×38): 40 mg via ORAL
  Filled 2023-04-13 (×38): qty 1

## 2023-04-13 MED ORDER — LIDOCAINE HCL 2 % IJ SOLN
20.0000 mL | Freq: Once | INTRAMUSCULAR | Status: AC
  Administered 2023-04-13: 400 mg via INTRADERMAL
  Filled 2023-04-13: qty 20

## 2023-04-13 NOTE — H&P (Cosign Needed Addendum)
Date: 04/13/2023               Patient Name:  Peggy House MRN: [id number]  DOB: 07-Mar-1961 Age / Sex: 62 y.o., female   PCP: Olegario Messier, MD         Medical Service: Internal Medicine Teaching Service         Attending Physician: Dr. Mayford Knife, Dorene Ar, MD      First Contact: Dr. Katheran James, DO Pager [phone]    Second Contact: Dr. Rana Snare, DO Pager [phone]         After Hours (After 5p/  First Contact Pager: [phone]  weekends / holidays): Second Contact Pager: [phone]   SUBJECTIVE   Chief Complaint: Weakness and syncope  History of Present Illness: This is a 62 year old female with significant past medical history of hereditary hemorrhagic telangiectasia, chronic anemia, hypertension, hyperlipidemia, GI bleed, arthritis, tobacco use disorder, and type 2 diabetes presenting following a syncopal episode at 0300 on 04/13/2023.  The patient states she had significant nosebleeds on 12/26 and 12/28.  After work she states she had felt very weak and lightheaded.  She states that she had a syncopal episode and fall today in the morning.  She felt weak and felt that her body gave out on her during this episode.  She injured her right ankle, but is unsure if she hit her head.  She states that her ankle pain is a 10 out of 10 and burning in nature.  She otherwise denies focal numbness or weakness.  She follows with hematology oncology for her chronic anemia, receives iron injections.  She also receives bevacizumab (a monoclonal antibody against VEGF) every 2 weeks.  She otherwise denies chest pain, shortness of breath, abdominal pain, hematemesis, melena, hematochezia, constipation, diarrhea, or any other bleeding that she is aware of.  She does endorse polyuria and polydipsia, but denies dysuria or frequency.  Meds:  Xanax 0.5mg  at bedtime, has not had it lately  Amlodipine 10 every day Citalopram 20 every day Lisinopril 20mg  every day Metformin 500  BID Nicotine 14mg  patch Ondansetron 4mg  q8h prn Pantoprazole 40mg  BID Potassium chloride every day Proair HFA 108 inhaler 1-2 puff q6h prn Trazodone 100mg  at bedtime Tylenol pm 500-25 x2 at bedtime Bevacizumab every 2 weeks- about 2 weeks ago  IV iron every 2 weeks or as needed   ED Course: CBC demonstrating leukocytosis to 14.5, microcytic anemia to 5.1.  This is chronic for this patient, last 3 hemoglobin values over the past month being 6.0, 5.1 and 5.7 respectively. CMP with hypokalemia to 3.2, elevated glucose to 142, albumin 2.7, and elevated creatinine to 1.18, baseline 0.80. PT/INR within normal limits On presentation patient bradycardic to 50, borderline hypotensive to 98/61. Patient given 1 g Tylenol p.o.  Given 25 mg Benadryl for planned blood transfusion.   Meds:  No outpatient medications have been marked as taking for the 04/13/23 encounter [facility] Encounter).    Past Medical History  Past Surgical History:  Procedure Laterality Date   ABDOMINAL HYSTERECTOMY     CARPAL TUNNEL RELEASE  05/13/2011   Procedure: CARPAL TUNNEL RELEASE;  Surgeon: Mable Paris, MD;  Location: [facility];  Service: Orthopedics;  Laterality: Left;   COLONOSCOPY N/A 03/02/2020   Procedure: COLONOSCOPY;  Surgeon: Bernette Redbird, MD;  Location: [facility] ENDOSCOPY;  Service: Endoscopy;  Laterality: N/A;   COLONOSCOPY WITH PROPOFOL N/A 04/28/2014   Procedure: COLONOSCOPY WITH PROPOFOL;  Surgeon: Florencia Reasons, MD;  Location:  [facility] ENDOSCOPY;  Service: Endoscopy;  Laterality: N/A;   DG TOES*L*  2/10   rt   DILATION AND CURETTAGE OF UTERUS     ENTEROSCOPY N/A 10/17/2017   Procedure: ENTEROSCOPY;  Surgeon: Kathi Der, MD;  Location: [facility]OSCOPY;  Service: Gastroenterology;  Laterality: N/A;   ESOPHAGOGASTRODUODENOSCOPY N/A 04/10/2014   Procedure: ESOPHAGOGASTRODUODENOSCOPY (EGD);  Surgeon: Shirley Friar, MD;  Location: [facility] ENDOSCOPY;  Service: Endoscopy;   Laterality: N/A;   ESOPHAGOGASTRODUODENOSCOPY N/A 05/10/2017   Procedure: ESOPHAGOGASTRODUODENOSCOPY (EGD);  Surgeon: Bernette Redbird, MD;  Location: [facility] ENDOSCOPY;  Service: Endoscopy;  Laterality: N/A;   ESOPHAGOGASTRODUODENOSCOPY N/A 09/22/2017   Procedure: ESOPHAGOGASTRODUODENOSCOPY (EGD);  Surgeon: Vida Rigger, MD;  Location: [facility] ENDOSCOPY;  Service: Endoscopy;  Laterality: N/A;  bedside   ESOPHAGOGASTRODUODENOSCOPY N/A 03/02/2020   Procedure: ESOPHAGOGASTRODUODENOSCOPY (EGD);  Surgeon: Bernette Redbird, MD;  Location: [facility];  Service: Endoscopy;  Laterality: N/A;   ESOPHAGOGASTRODUODENOSCOPY N/A 11/03/2020   Procedure: ESOPHAGOGASTRODUODENOSCOPY (EGD);  Surgeon: Charlott Rakes, MD;  Location: [facility] ENDOSCOPY;  Service: Endoscopy;  Laterality: N/A;   ESOPHAGOGASTRODUODENOSCOPY N/A 04/12/2022   Procedure: ESOPHAGOGASTRODUODENOSCOPY (EGD);  Surgeon: Vida Rigger, MD;  Location: [facility] ENDOSCOPY;  Service: Gastroenterology;  Laterality: N/A;   ESOPHAGOGASTRODUODENOSCOPY N/A 05/27/2022   Procedure: ESOPHAGOGASTRODUODENOSCOPY (EGD);  Surgeon: Willis Modena, MD;  Location: [facility] ENDOSCOPY;  Service: Gastroenterology;  Laterality: N/A;   ESOPHAGOGASTRODUODENOSCOPY N/A 09/27/2022   Procedure: ESOPHAGOGASTRODUODENOSCOPY (EGD);  Surgeon: Lynann Bologna, DO;  Location: [facility] ENDOSCOPY;  Service: Gastroenterology;  Laterality: N/A;   ESOPHAGOGASTRODUODENOSCOPY (EGD) WITH PROPOFOL N/A 04/27/2014   Procedure: ESOPHAGOGASTRODUODENOSCOPY (EGD) WITH PROPOFOL;  Surgeon: Florencia Reasons, MD;  Location: [facility] ENDOSCOPY;  Service: Endoscopy;  Laterality: N/A;  possible apc   ESOPHAGOGASTRODUODENOSCOPY (EGD) WITH PROPOFOL N/A 09/30/2017   Procedure: ESOPHAGOGASTRODUODENOSCOPY (EGD) WITH PROPOFOL;  Surgeon: Kerin Salen, MD;  Location: [facility]OSCOPY;  Service: Gastroenterology;  Laterality: N/A;   ESOPHAGOGASTRODUODENOSCOPY (EGD) WITH PROPOFOL N/A 10/01/2017   Procedure: ESOPHAGOGASTRODUODENOSCOPY (EGD) WITH PROPOFOL;   Surgeon: Kerin Salen, MD;  Location: [facility] ENDOSCOPY;  Service: Gastroenterology;  Laterality: N/A;   ESOPHAGOGASTRODUODENOSCOPY (EGD) WITH PROPOFOL N/A 10/08/2017   Procedure: ESOPHAGOGASTRODUODENOSCOPY (EGD) WITH PROPOFOL;  Surgeon: Kathi Der, MD;  Location: [facility];  Service: Gastroenterology;  Laterality: N/A;   ESOPHAGOGASTRODUODENOSCOPY (EGD) WITH PROPOFOL N/A 10/17/2017   Procedure: ESOPHAGOGASTRODUODENOSCOPY (EGD) WITH PROPOFOL;  Surgeon: Kathi Der, MD;  Location: [facility] ENDOS[facility];  Service: Gastroenterology;  Laterality: N/A;   ESOPHAGOGASTRODUODENOSCOPY (EGD) WITH PROPOFOL N/A 10/19/2017   Procedure: ESOPHAGOGASTRODUODENOSCOPY (EGD) WITH PROPOFOL;  Surgeon: Kathi Der, MD;  Location: [facility];  Service: Gastroenterology;  Laterality: N/A;   ESOPHAGOGASTRODUODENOSCOPY (EGD) WITH PROPOFOL N/A 12/04/2018   Procedure: ESOPHAGOGASTRODUODENOSCOPY (EGD) WITH PROPOFOL;  Surgeon: Charlott Rakes, MD;  Location: [facility] ENDOSCOPY;  Service: Endoscopy;  Laterality: N/A;   GIVENS CAPSULE STUDY N/A 10/02/2017   Procedure: GIVENS CAPSULE STUDY;  Surgeon: Kerin Salen, MD;  Location: [facility] ENDOSCOPY;  Service: Gastroenterology;  Laterality: N/A;   GIVENS CAPSULE STUDY N/A 10/08/2017   Procedure: GIVENS CAPSULE STUDY;  Surgeon: Kathi Der, MD;  Location: [facility] ENDOSCOPY;  Service: Gastroenterology;  Laterality: N/A;  endoscopic placement of capsule   GIVENS CAPSULE STUDY N/A 03/02/2020   Procedure: GIVENS CAPSULE STUDY;  Surgeon: Bernette Redbird, MD;  Location: [facility] ENDOS[facility];  Service: Endoscopy;  Laterality: N/A;   HEMOSTASIS CLIP PLACEMENT  12/04/2018   Procedure: HEMOSTASIS CLIP PLACEMENT;  Surgeon: Charlott Rakes, MD;  Location: [facility] ENDOS[facility];  Service: Endoscopy;;   HEMOSTASIS CLIP PLACEMENT  05/27/2022   Procedure: HEMOSTASIS CLIP PLACEMENT;  Surgeon: Dulce Sellar,  Chrissie Noa, MD;  Location: [facility]OSCOPY;  Service: Gastroenterology;;   HEMOSTASIS CLIP PLACEMENT  09/27/2022   Procedure:  HEMOSTASIS CLIP PLACEMENT;  Surgeon: Lynann Bologna, DO;  Location: [facility] ENDOSCOPY;  Service: Gastroenterology;;   HEMOSTASIS CONTROL  05/27/2022   Procedure: HEMOSTASIS CONTROL;  Surgeon: Willis Modena, MD;  Location: [facility];  Service: Gastroenterology;;   HOT HEMOSTASIS N/A 04/27/2014   Procedure: HOT HEMOSTASIS (ARGON PLASMA COAGULATION/BICAP);  Surgeon: Florencia Reasons, MD;  Location: [facility] ENDOSCOPY;  Service: Endoscopy;  Laterality: N/A;   HOT HEMOSTASIS N/A 09/30/2017   Procedure: HOT HEMOSTASIS (ARGON PLASMA COAGULATION/BICAP);  Surgeon: Kerin Salen, MD;  Location: [facility] ENDOSCOPY;  Service: Gastroenterology;  Laterality: N/A;   HOT HEMOSTASIS N/A 10/01/2017   Procedure: HOT HEMOSTASIS (ARGON PLASMA COAGULATION/BICAP);  Surgeon: Kerin Salen, MD;  Location: [facility] ENDOSCOPY;  Service: Gastroenterology;  Laterality: N/A;   HOT HEMOSTASIS N/A 10/17/2017   Procedure: HOT HEMOSTASIS (ARGON PLASMA COAGULATION/BICAP);  Surgeon: Kathi Der, MD;  Location: [facility] ENDOSCOPY;  Service: Gastroenterology;  Laterality: N/A;   HOT HEMOSTASIS N/A 10/19/2017   Procedure: HOT HEMOSTASIS (ARGON PLASMA COAGULATION/BICAP);  Surgeon: Kathi Der, MD;  Location: [facility] ENDOSCOPY;  Service: Gastroenterology;  Laterality: N/A;   HOT HEMOSTASIS N/A 03/02/2020   Procedure: HOT HEMOSTASIS (ARGON PLASMA COAGULATION/BICAP);  Surgeon: Bernette Redbird, MD;  Location: [facility];  Service: Endoscopy;  Laterality: N/A;   HOT HEMOSTASIS N/A 04/12/2022   Procedure: HOT HEMOSTASIS (ARGON PLASMA COAGULATION/BICAP);  Surgeon: Vida Rigger, MD;  Location: [facility]OSCOPY;  Service: Gastroenterology;  Laterality: N/A;   IR IMAGING GUIDED PORT INSERTION  07/08/2018   L shoulder Surgery  2011   POLYPECTOMY  03/02/2020   Procedure: POLYPECTOMY;  Surgeon: Bernette Redbird, MD;  Location: [facility];  Service: Endoscopy;;   Susa Day  11/03/2020   Procedure: Susa Day;  Surgeon: Charlott Rakes, MD;  Location[facility] ENDOSCOPY;   Service: Endoscopy;;   SUBMUCOSAL INJECTION  09/22/2017   Procedure: SUBMUCOSAL INJECTION;  Surgeon: Vida Rigger, MD;  Location: [facility] ENDOSCOPY;  Service: Endoscopy;;   SUBMUCOSAL INJECTION  12/04/2018   Procedure: SUBMUCOSAL INJECTION;  Surgeon: Charlott Rakes, MD;  Location: [facility] ENDOSCOPY;  Service: Endoscopy;;    Social:  Lives with son in [place]  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 She does not cook a lot, and son and son's gf helps with cooking. She is able to go to bathroom on her own. She does not leave the house much and she reports that her son or her friend takes her to places she needs to go to,  PCP: Nooruddin, Jason Fila, MD Substances: current smoker 1/2ppd x 40 years, no alcohol or substances   Allergies: Allergies as of 04/13/2023 - Review Complete 04/13/2023  Allergen Reaction Noted   Feraheme [ferumoxytol] Other (See Comments) 10/09/2017   Nsaids Other (See Comments) 04/25/2014   Tomato Hives 01/01/2012   Iron (ferrous sulfate) [ferrous sulfate er] Other (See Comments) 03/07/2020   Wasp venom Swelling 10/06/2017     OBJECTIVE:   Physical Exam: Blood pressure 120/61, pulse 65, temperature 98 F (36.7 C), resp. rate 18, height 5\' 3"  (1.6 m), weight 104.1 kg, SpO2 100%.  Constitutional: Chronically ill-appearing in no acute distress Cardiovascular: regular rate and rhythm, murmur appreciated at the upper sternal border. Pulmonary/Chest: normal work of breathing on room  air, lungs clear to auscultation bilaterally Abdominal: soft, non-tender, non-distended MSK: Pain with palpation of the right ankle diffusely. No pain with palpation of the right knee. Neurological: alert & oriented x 3, 5/5 strength in bilateral upper and lower Extremities.  No pronator drift.   Cranial nerves grossly intact. Skin: warm and dry. 1+ lower extremity edema bilaterally.  Hyperpigmented macules of the lips noted.   Psych: Pleasant affect  Labs: CBC    Component Value Date/Time   WBC 14.5 (H) 04/13/2023 0327   RBC 2.03 (L) 04/13/2023 0327   HGB 5.1 (LL) 04/13/2023 0327   HGB 6.0 (LL) 04/04/2023 1111   HGB 8.6 (L) 06/14/2020 1626   HGB 8.1 (L) 02/05/2017 1407   HCT 18.0 (L) 04/13/2023 0327   HCT 28.2 (L) 06/14/2020 1626   HCT 26.6 (L) 02/05/2017 1407   PLT 201 04/13/2023 0327   PLT 458 (H) 04/04/2023 1111   PLT 353 06/14/2020 1626   MCV 88.7 04/13/2023 0327   MCV 89 06/14/2020 1626   MCV 82.8 02/05/2017 1407   MCH 25.1 (L) 04/13/2023 0327   MCHC 28.3 (L) 04/13/2023 0327   RDW 22.2 (H) 04/13/2023 0327   RDW 18.9 (H) 06/14/2020 1626   RDW 22.6 (H) 02/05/2017 1407   LYMPHSABS 1.1 04/04/2023 1111   LYMPHSABS 2.5 05/20/2017 1551   LYMPHSABS 2.6 02/05/2017 1407   MONOABS 0.5 04/04/2023 1111   MONOABS 0.5 02/05/2017 1407   EOSABS 0.1 04/04/2023 1111   EOSABS 0.1 05/20/2017 1551   BASOSABS 0.0 04/04/2023 1111   BASOSABS 0.0 05/20/2017 1551   BASOSABS 0.1 02/05/2017 1407     CMP     Component Value Date/Time   NA 140 04/13/2023 0327   NA 147 (H) 04/02/2017 1708   NA 141 02/05/2017 1407   K 3.2 (L) 04/13/2023 0327   K 3.5 02/05/2017 1407   CL 112 (H) 04/13/2023 0327   CO2 20 (L) 04/13/2023 0327   CO2 24 02/05/2017 1407   GLUCOSE 142 (H) 04/13/2023 0327   GLUCOSE 134 02/05/2017 1407   BUN 8 04/13/2023 0327   BUN 5 (L) 04/02/2017 1708   BUN 9.3 02/05/2017 1407   CREATININE 1.18 (H) 04/13/2023 0327   CREATININE 0.89 04/04/2023 1111   CREATININE 0.9 02/05/2017 1407   CALCIUM 8.6 (L) 04/13/2023 0327   CALCIUM 8.9 02/05/2017 1407   PROT 5.4 (L) 04/13/2023 0327   PROT 6.7 04/02/2017 1708   PROT 7.1 02/05/2017 1407   ALBUMIN 2.7 (L) 04/13/2023 0327   ALBUMIN 3.8 04/02/2017 1708   ALBUMIN 3.5 02/05/2017 1407   AST 15 04/13/2023 0327   AST 9 (L)  04/04/2023 1111   AST 13 02/05/2017 1407   ALT 8 04/13/2023 0327   ALT <5 04/04/2023 1111   ALT 8 02/05/2017 1407   ALKPHOS 49 04/13/2023 0327   ALKPHOS 84 02/05/2017 1407   BILITOT <0.2 04/13/2023 0327   BILITOT 0.3 04/04/2023 1111   BILITOT 0.23 02/05/2017 1407   GFRNONAA 52 (L) 04/13/2023 0327   GFRNONAA >60 04/04/2023 1111   GFRAA >60 01/06/2020 3762    Imaging:  EKG: personally reviewed my interpretation is no acute pathology, QTc prolongation to 511.  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 person living with a history of hereditary hemorrhagic telangiectasia, chronic iron deficiency anemia, type 2 diabetes, tobacco use disorder, GAD who presented following a syncopal episode and admitted for symptomatic anemia on hospital day 0  #Symptomatic anemia #Hereditary hemorrhagic telangiectasia Patient with known history of multiple bleeding events as well as chronic anemia presenting with signs and symptoms concerning for acute bleed and symptomatic anemia.  Has extensive history of GI bleeding, but denies any symptoms of GI bleeding.  Bleeding primarily seems to have been secondary to epistaxis. Plan: - Transfuse 2 unit packed red blood cell  #Syncope #Systolic Cardiac murmur For syncopal episode likely secondary to orthostatic hypotension, but unclear etiology.  Concern would be if patient could have worsening aortic stenosis which could lead to syncope. Patient states that they felt weak and felt her body gave way.  Unclear if true loss of consciousness.  Unclear if patient hit her head.  Patient does have  a murmur, which she states is not new.  However, no significant valvular pathology on last echo in 2019. Would expect tachycardia with symptomatic anemia, however was actually bradycardic on presentation. Plan: - Update echo in setting of murmur, rule out cardiac syncope. - Telemetry - Given patient's bleeding disorder, possibility of head trauma, will obtain noncontrast CT head. - Holding home BP regiment  #Ankle pain Severe pain of the right foot and ankle.  Rated at a 10 out of 10 intensity, unable to ambulate and diffusely painful. Plan: - Meets [place] ankle criteria for foot and ankle x-rays  -Obvious fracture seen on x-ray, read pending.  -Will likely need Ortho consult - Oxycodone as needed for pain - Scheduled Tylenol 1 g 3 times daily - Voltaren gel as needed  #Acute kidney injury Creatinine elevated to 1.18, baseline around 0.80.  Favored to be prerenal in nature in setting of epistaxis. Plan: - Daily BMP -Consider IV fluid resuscitation in addition to blood products.  #Hypokalemia Hypokalemic to 3.2. Plan: - Replete potassium p.o. - Mag level  #Leukocytosis  Unclear etiology.  Does endorse polyuria and polydipsia, but denies dysuria.  Denies fevers, endorses chills though states this is chronic for her.  Denies URI symptoms. Plan: - Trend CBC, consider differential - Consider UA - Monitor for sick symptoms  #Type 2 diabetes Last A1c 02/01/2023, 5.0.  She seems to be well-controlled for many years.  Plan: - Hold metformin in setting of AKI - CBG  #Tobacco use disorder Half pack a day smoker. Plan: - Will hold nicotine patch in setting of possible surgery -No PFTs on file to formally diagnose COPD, resume home albuterol  #GAD Resume PTA patient monitored trazodone, Remeron, Celexa  Diet: NPO VTE: SCDs IVF: Blood Code: DNR/DNI  Prior to Admission Living Arrangement: Home, living with son Anticipated Discharge Location: Home Barriers to Discharge:  Workup, resolution of symptoms  Dispo: Admit patient to Observation with expected length of stay less than 2 midnights.  Signed: Lovie Macadamia, MD Internal Medicine Resident PGY-1  04/13/2023, 7:03 AM

## 2023-04-13 NOTE — Procedures (Signed)
Discussed options with patient and recommend a closed reduction to attempt to achieve an appropriate alignment of fracture fragments and decrease the risk of skin and neurovascular compromise.  Risks and benefits discussed at length including but not limited to need for pain, need for additional procedures, possibility of damage to surrounding structures.  Understanding the risks the patient elected to proceed.  Timeout called and patient and extremity correctly identified.    Under sterile conditions 10 mL of 1% lidocaine was injected at the fracture site to perform a hematoma block.  Adequate anesthesia was confirmed prior to the procedure proceeding.  A gentle closed reduction was performed.  A well padded short leg splint was applied with the assistance of the Ortho Tech. Post reduction XR demonstrated improved alignment of the fracture. The patient tolerated the procedure without complications.

## 2023-04-13 NOTE — ED Notes (Signed)
ED TO INPATIENT HANDOFF REPORT  ED Nurse Name and Phone #: [phone]  S Name/Age/Gender Peggy House 62 y.o. female Room/Bed: 037C/037C  Code Status   Code Status: Limited: Do not attempt resuscitation (DNR) -DNR-LIMITED -Do Not Intubate/DNI   Home/SNF/Other Home Patient oriented to: self, place, time, and situation Is this baseline? Yes   Triage Complete: Triage complete  Chief Complaint Symptomatic anemia [D64.9]  Triage Note Pt BIB EMS from home with c/o epistaxis and LOC. C/o right knee and right ankle pain. Pt has HHT and gets weekly blood transfusions.   HR 40-50s 80/40   Allergies Allergies  Allergen Reactions   Feraheme [Ferumoxytol] Other (See Comments)    Muscle tetany, encephalopathy, fatigue, nausea (reaction to injection)   Nsaids Other (See Comments)    Stomach bleeding episodes; Tylenol is OK   Tomato Hives   Iron (Ferrous Sulfate) [Ferrous Sulfate Er] Other (See Comments)    Shock   Wasp Venom Swelling    Level of Care/Admitting Diagnosis ED Disposition     ED Disposition  Admit   Condition  --   Comment  Hospital Area: [facility] [[id number]]  Level of Care: Med-Surg [[id number]]  May place patient in observation at [facility] or [facility] if equivalent level of care is available:: No  Covid Evaluation: Asymptomatic - no recent exposure (last 10 days) testing not required  Diagnosis: Symptomatic anemia [[id number]]  Admitting Physician: Miguel Aschoff [[id number]]  Attending Physician: Miguel Aschoff [[id number]]          B Medical/Surgery History Past Medical History:  Diagnosis Date   Anxiety    Arthritis    knnes,back   GERD (gastroesophageal reflux disease)    Hereditary hemorrhagic telangiectasia (HCC)    History of swelling of feet    Hyperlipidemia    Hypertension    Major depressive disorder, recurrent episode (HCC) 06/05/2015   Obesity    Snores    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   Past Surgical History:  Procedure Laterality Date   ABDOMINAL HYSTERECTOMY     CARPAL TUNNEL RELEASE  05/13/2011   Procedure: CARPAL TUNNEL RELEASE;  Surgeon: Mable Paris, MD;  Location: [facility];  Service: Orthopedics;  Laterality: Left;   COLONOSCOPY N/A 03/02/2020   Procedure: COLONOSCOPY;  Surgeon: Bernette Redbird, MD;  Location: [facility] ENDOSCOPY;  Service: Endoscopy;  Laterality: N/A;   COLONOSCOPY WITH PROPOFOL N/A 04/28/2014   Procedure: COLONOSCOPY WITH PROPOFOL;  Surgeon: Florencia Reasons, MD;  Location: [facility] ENDOSCOPY;  Service: Endoscopy;  Laterality: N/A;   DG TOES*L*  2/10   rt   DILATION AND CURETTAGE OF UTERUS     ENTEROSCOPY N/A 10/17/2017   Procedure: ENTEROSCOPY;  Surgeon: Kathi Der, MD;  Location: [facility] ENDOSCOPY;  Service: Gastroenterology;  Laterality: N/A;   ESOPHAGOGASTRODUODENOSCOPY N/A 04/10/2014   Procedure: ESOPHAGOGASTRODUODENOSCOPY (EGD);  Surgeon: Shirley Friar, MD;  Location: [facility] ENDOSCOPY;  Service: Endoscopy;  Laterality: N/A;   ESOPHAGOGASTRODUODENOSCOPY N/A 05/10/2017   Procedure: ESOPHAGOGASTRODUODENOSCOPY (EGD);  Surgeon: Bernette Redbird, MD;  Location: [facility] ENDOSCOPY;  Service: Endoscopy;  Laterality: N/A;   ESOPHAGOGASTRODUODENOSCOPY N/A 09/22/2017   Procedure: ESOPHAGOGASTRODUODENOSCOPY (EGD);  Surgeon: Vida Rigger, MD;  Location: [facility] ENDOSCOPY;  Service: Endoscopy;  Laterality: N/A;  bedside   ESOPHAGOGASTRODUODENOSCOPY N/A 03/02/2020   Procedure: ESOPHAGOGASTRODUODENOSCOPY (EGD);  Surgeon: Bernette Redbird, MD;  Location: [facility] ENDOSCOPY;  Service: Endoscopy;  Laterality: N/A;   ESOPHAGOGASTRODUODENOSCOPY N/A 11/03/2020   Procedure: ESOPHAGOGASTRODUODENOSCOPY (EGD);  Surgeon: Charlott Rakes, MD;  Location: [facility] ENDOSCOPY;  Service: Endoscopy;  Laterality: N/A;   ESOPHAGOGASTRODUODENOSCOPY N/A 04/12/2022   Procedure: ESOPHAGOGASTRODUODENOSCOPY (EGD);  Surgeon: Vida Rigger, MD;  Location: [facility] ENDOSCOPY;  Service:  Gastroenterology;  Laterality: N/A;   ESOPHAGOGASTRODUODENOSCOPY N/A 05/27/2022   Procedure: ESOPHAGOGASTRODUODENOSCOPY (EGD);  Surgeon: Willis Modena, MD;  Location: [facility] ENDOSCOPY;  Service: Gastroenterology;  Laterality: N/A;   ESOPHAGOGASTRODUODENOSCOPY N/A 09/27/2022   Procedure: ESOPHAGOGASTRODUODENOSCOPY (EGD);  Surgeon: Lynann Bologna, DO;  Location: [facility] ENDOSCOPY;  Service: Gastroenterology;  Laterality: N/A;   ESOPHAGOGASTRODUODENOSCOPY (EGD) WITH PROPOFOL N/A 04/27/2014   Procedure: ESOPHAGOGASTRODUODENOSCOPY (EGD) WITH PROPOFOL;  Surgeon: Florencia Reasons, MD;  Location: [facility] ENDOSCOPY;  Service: Endoscopy;  Laterality: N/A;  possible apc   ESOPHAGOGASTRODUODENOSCOPY (EGD) WITH PROPOFOL N/A 09/30/2017   Procedure: ESOPHAGOGASTRODUODENOSCOPY (EGD) WITH PROPOFOL;  Surgeon: Kerin Salen, MD;  Location: [facility] ENDOSCOPY;  Service: Gastroenterology;  Laterality: N/A;   ESOPHAGOGASTRODUODENOSCOPY (EGD) WITH PROPOFOL N/A 10/01/2017   Procedure: ESOPHAGOGASTRODUODENOSCOPY (EGD) WITH PROPOFOL;  Surgeon: Kerin Salen, MD;  Location: [facility] ENDOSCOPY;  Service: Gastroenterology;  Laterality: N/A;   ESOPHAGOGASTRODUODENOSCOPY (EGD) WITH PROPOFOL N/A 10/08/2017   Procedure: ESOPHAGOGASTRODUODENOSCOPY (EGD) WITH PROPOFOL;  Surgeon: Kathi Der, MD;  Location[facility] ENDOSCOPY;  Service: Gastroenterology;  Laterality: N/A;   ESOPHAGOGASTRODUODENOSCOPY (EGD) WITH PROPOFOL N/A 10/17/2017   Procedure: ESOPHAGOGASTRODUODENOSCOPY (EGD) WITH PROPOFOL;  Surgeon: Kathi Der, MD;  Location[facility]OSCOPY;  Service: Gastroenterology;  Laterality: N/A;   ESOPHAGOGASTRODUODENOSCOPY (EGD) WITH PROPOFOL N/A 10/19/2017   Procedure: ESOPHAGOGASTRODUODENOSCOPY (EGD) WITH PROPOFOL;  Surgeon: Kathi Der, MD;  [facility];  Service: Gastroenterology;  Laterality: N/A;   ESOPHAGOGASTRODUODENOSCOPY (EGD) WITH PROPOFOL N/A 12/04/2018   Procedure: ESOPHAGOGASTRODUODENOSCOPY (EGD) WITH PROPOFOL;  Surgeon: Charlott Rakes, MD;  Location: [facility] END[facility];  Service: Endoscopy;  Laterality: N/A;   GIVENS CAPSULE STUDY N/A 10/02/2017   Procedure: GIVENS CAPSULE STUDY;  Surgeon: Kerin Salen, MD;  Location: [facility] ENDOSCOPY;  Service: Gastroenterology;  Laterality: N/A;   GIVENS CAPSULE STUDY N/A 10/08/2017   Procedure: GIVENS CAPSULE STUDY;  Surgeon: Kathi Der, MD;  Location[facility]OSCOPY;  Service: Gastroenterology;  Laterality: N/A;  endoscopic placement of capsule   GIVENS CAPSULE STUDY N/A 03/02/2020   Procedure: GIVENS CAPSULE STUDY;  Surgeon: Bernette Redbird, MD;  Location: [facility];  Service: Endoscopy;  Laterality: N/A;   HEMOSTASIS CLIP PLACEMENT  12/04/2018   Procedure: HEMOSTASIS CLIP PLACEMENT;  Surgeon: Charlott Rakes, MD;  Location[facility] END[facility];  Service: Endoscopy;;   HEMOSTASIS CLIP PLACEMENT  05/27/2022   Procedure: HEMOSTASIS CLIP PLACEMENT;  Surgeon: Willis Modena, MD;  Location: [facility];  Service: Gastroenterology;;   HEMOSTASIS CLIP PLACEMENT  09/27/2022   Procedure: HEMOSTASIS CLIP PLACEMENT;  Surgeon: Lynann Bologna, DO;  Location: [facility] ENDOSCOPY;  Service: Gastroenterology;;   HEMOSTASIS CONTROL  05/27/2022   Procedure: HEMOSTASIS CONTROL;  Surgeon: Willis Modena, MD;  Location[facility];  Service: Gastroenterology;;   HOT HEMOSTASIS N/A 04/27/2014   Procedure: HOT HEMOSTASIS (ARGON PLASMA COAGULATION/BICAP);  Surgeon: Florencia Reasons, MD;  Location: [facility] ENDOSCOPY;  Service: Endoscopy;  Laterality: N/A;   HOT HEMOSTASIS N/A 09/30/2017   Procedure: HOT HEMOSTASIS (ARGON PLASMA COAGULATION/BICAP);  Surgeon: Kerin Salen, MD;  Location: [facility] ENDOSCOPY;  Service: Gastroenterology;  Laterality: N/A;   HOT HEMOSTASIS N/A 10/01/2017   Procedure: HOT HEMOSTASIS (ARGON PLASMA COAGULATION/BICAP);  Surgeon: Kerin Salen, MD;  Location: [facility] ENDOSCOPY;  Service: Gastroenterology;  Laterality: N/A;   HOT HEMOSTASIS N/A 10/17/2017   Procedure: HOT HEMOSTASIS (ARGON PLASMA COAGULATION/BICAP);   Surgeon: Levora Angel,  Darcus Austin, MD;  Location: [facility] ENDOSCOPY;  Service: Gastroenterology;  Laterality: N/A;   HOT HEMOSTASIS N/A 10/19/2017   Procedure: HOT HEMOSTASIS (ARGON PLASMA COAGULATION/BICAP);  Surgeon: Kathi Der, MD;  Location: [facility] ENDOSCOPY;  Service: Gastroenterology;  Laterality: N/A;   HOT HEMOSTASIS N/A 03/02/2020   Procedure: HOT HEMOSTASIS (ARGON PLASMA COAGULATION/BICAP);  Surgeon: Bernette Redbird, MD;  Location: [facility];  Service: Endoscopy;  Laterality: N/A;   HOT HEMOSTASIS N/A 04/12/2022   Procedure: HOT HEMOSTASIS (ARGON PLASMA COAGULATION/BICAP);  Surgeon: Vida Rigger, MD;  Location: [facility];  Service: Gastroenterology;  Laterality: N/A;   IR IMAGING GUIDED PORT INSERTION  07/08/2018   L shoulder Surgery  2011   POLYPECTOMY  03/02/2020   Procedure: POLYPECTOMY;  Surgeon: Bernette Redbird, MD;  Location: [facility];  Service: Endoscopy;;   SCLEROTHERAPY  11/03/2020   Procedure: Susa Day;  Surgeon: Charlott Rakes, MD;  Location: [facility] ENDOSCOPY;  Service: Endoscopy;;   SUBMUCOSAL INJECTION  09/22/2017   Procedure: SUBMUCOSAL INJECTION;  Surgeon: Vida Rigger, MD;  Location: [facility] END[facility]COPY;  Service: Endoscopy;;   SUBMUCOSAL INJECTION  12/04/2018   Procedure: SUBMUCOSAL INJECTION;  Surgeon: Charlott Rakes, MD;  Location: [facility] ENDOSCOPY;  Service: Endoscopy;;     A IV Location/Drains/Wounds Patient Lines/Drains/Airways Status     Active Line/Drains/Airways     Name Placement date Placement time Site Days   Implanted Port 07/08/18 Right Chest 07/08/18  --  Chest  1740   Peripheral IV 04/13/23 18 G Left Antecubital 04/13/23  0319  Antecubital  less than 1            Intake/Output Last 24 hours  Intake/Output Summary (Last 24 hours) at 04/13/2023 1327 Last data filed at 04/13/2023 1106 Gross per 24 hour  Intake 845 ml  Output --  Net 845 ml    Labs/Imaging Results for orders placed or performed during the hospital encounter of 04/13/23 (from  the past 48 hours)  CBC     Status: Abnormal   Collection Time: 04/13/23  3:27 AM  Result Value Ref Range   WBC 14.5 (H) 4.0 - 10.5 K/uL   RBC 2.03 (L) 3.87 - 5.11 MIL/uL   Hemoglobin 5.1 (LL) 12.0 - 15.0 g/dL    Comment: REPEATED TO VERIFY THIS CRITICAL RESULT HAS VERIFIED AND BEEN CALLED TO C.[place] RN BY GLENDA GANADEN ON 12 29 2024 AT 0409, AND HAS BEEN READ BACK.     HCT 18.0 (L) 36.0 - 46.0 %   MCV 88.7 80.0 - 100.0 fL   MCH 25.1 (L) 26.0 - 34.0 pg   MCHC 28.3 (L) 30.0 - 36.0 g/dL   RDW 38.1 (H) 01.7 - 51.0 %   Platelets 201 150 - 400 K/uL   nRBC 0.0 0.0 - 0.2 %    Comment: Performed at [facility], [address], [place], [place] [address]  Comprehensive metabolic panel     Status: Abnormal   Collection Time: 04/13/23  3:27 AM  Result Value Ref Range   Sodium 140 135 - 145 mmol/L   Potassium 3.2 (L) 3.5 - 5.1 mmol/L   Chloride 112 (H) 98 - 111 mmol/L   CO2 20 (L) 22 - 32 mmol/L   Glucose, Bld 142 (H) 70 - 99 mg/dL    Comment: Glucose reference range applies only to samples taken after fasting for at least 8 hours.   BUN 8 8 - 23 mg/dL   Creatinine, Ser 7.78 (H) 0.44 - 1.00 mg/dL   Calcium 8.6 (L) 8.9 -  10.3 mg/dL   Total Protein 5.4 (L) 6.5 - 8.1 g/dL   Albumin 2.7 (L) 3.5 - 5.0 g/dL   AST 15 15 - 41 U/L   ALT 8 0 - 44 U/L   Alkaline Phosphatase 49 38 - 126 U/L   Total Bilirubin <0.2 <1.2 mg/dL   GFR, Estimated 52 (L) >60 mL/min    Comment: (NOTE) Calculated using the CKD-EPI Creatinine Equation (2021)    Anion gap 8 5 - 15    Comment: Performed at [facility], [address] [place], [place] [address]  Protime-INR     Status: None   Collection Time: 04/13/23  3:27 AM  Result Value Ref Range   Prothrombin Time 15.1 11.4 - 15.2 seconds   INR 1.2 0.8 - 1.2    Comment: (NOTE) INR goal varies based on device and disease states. Performed at [facility], [address] [place], [place] [address]   Magnesium     Status: None    Collection Time: 04/13/23  3:27 AM  Result Value Ref Range   Magnesium 1.9 1.7 - 2.4 mg/dL    Comment: Performed at [facility], [address] [place], [place] [address]  Type and screen [facility]     Status: None (Preliminary result)   Collection Time: 04/13/23  4:03 AM  Result Value Ref Range   ABO/RH(D) A NEG    Antibody Screen NEG    Sample Expiration 04/16/2023,2359    Unit Number [id number]    Blood Component Type RBC LR PHER2    Unit division 00    Status of Unit ISSUED    Transfusion Status OK TO TRANSFUSE    Crossmatch Result      Compatible Performed at [facility], [address], [place], [place] [address]    Unit Number [id number]    Blood Component Type RBC LR PHER1    Unit division 00    Status of Unit ISSUED    Transfusion Status OK TO TRANSFUSE    Crossmatch Result Compatible   Prepare RBC (crossmatch)     Status: None   Collection Time: 04/13/23  4:30 AM  Result Value Ref Range   Order Confirmation      ORDER PROCESSED BY BLOOD BANK Performed at [facility], [address] [place], [place] [address]   CBG monitoring, ED     Status: Abnormal   Collection Time: 04/13/23  7:53 AM  Result Value Ref Range   Glucose-Capillary 110 (H) 70 - 99 mg/dL    Comment: Glucose reference range applies only to samples taken after fasting for at least 8 hours.  CBG monitoring, ED     Status: Abnormal   Collection Time: 04/13/23 12:30 PM  Result Value Ref Range   Glucose-Capillary 100 (H) 70 - 99 mg/dL    Comment: Glucose reference range applies only to samples taken after fasting for at least 8 hours.   *Note: Due to a large number of results and/or encounters for the requested time period, some results have not been displayed. A complete set of results can be found in Results Review.   DG Ankle Right Port Result Date: 04/13/2023 CLINICAL DATA:  Status post closed ankle reduction. EXAM: PORTABLE RIGHT ANKLE - 2 VIEW  COMPARISON:  Radiograph from earlier the same day. FINDINGS: Overlying fiberglass cast obscures fine bony details. Status post reduction of the right ankle. There is improved alignment of the  ankle mortise. There is also improved alignment of the medial and lateral malleolar fractures. No other acute fracture or dislocation seen. Calcaneal spur noted along the Achilles tendon and Plantar aponeurosis attachment sites. No radiopaque foreign bodies.  No focal soft tissue swelling. IMPRESSION: *Improved alignment of ankle fracture/dislocation, status post reduction. Electronically Signed   By: Jules Schick M.D.   On: 04/13/2023 11:19   CT HEAD WO CONTRAST Result Date: 04/13/2023 CLINICAL DATA:  62 year old female with history of lightheadedness and unwitnessed fall. EXAM: CT HEAD WITHOUT CONTRAST TECHNIQUE: Contiguous axial images were obtained from the base of the skull through the vertex without intravenous contrast. RADIATION DOSE REDUCTION: This exam was performed according to the departmental dose-optimization program which includes automated exposure control, adjustment of the mA and/or kV according to patient size and/or use of iterative reconstruction technique. COMPARISON:  Head CT 08/01/2007. FINDINGS: Brain: Physiologic calcifications in the basal ganglia bilaterally. No evidence of acute infarction, hemorrhage, hydrocephalus, extra-axial collection or mass lesion/mass effect. Vascular: No hyperdense vessel or unexpected calcification. Skull: Normal. Negative for fracture or focal lesion. Sinuses/Orbits: No acute finding. Small mucosal retention cyst or polyp in the medial aspect of the left maxillary sinus, similar to remote prior study. Other: None. IMPRESSION: 1. No evidence of significant acute traumatic injury to the skull or brain. Electronically Signed   By: Trudie Reed M.D.   On: 04/13/2023 08:56   DG Ankle Complete Right Result Date: 04/13/2023 CLINICAL DATA:  62 year old female with  history of trauma from a fall complaining of right ankle pain. EXAM: RIGHT ANKLE - COMPLETE 3+ VIEW COMPARISON:  No priors. FINDINGS: Acute fracture/subluxation of the right ankle is noted. Specifically, there is disruption of the normal ankle mortise which is widened approximately 1.2 cm medially, secondary to lateral subluxation of the talus relative to the tibia. Mildly displaced fracture of the medial malleolus (7 mm) is noted. There is also a fracture of the lateral malleolus which is minimally displaced (3 mm laterally) and minimally angulated (approximately 20 degrees of lateral angulation). Irregularity of the posterior malleolus of the distal tibia is also noted, which could suggest a nondisplaced posterior malleolar fracture. Soft tissues are mildly swollen surrounding the ankle joint. Multifocal joint space narrowing, subchondral sclerosis, subchondral cyst formation and osteophyte formation is noted throughout the visualized portions of the midfoot and hindfoot, indicative of osteoarthritis. IMPRESSION: 1. Acute fracture/subluxation of the right ankle, favored to be trimalleolar, as detailed above. Electronically Signed   By: Trudie Reed M.D.   On: 04/13/2023 06:54    Pending Labs Unresulted Labs (From admission, onward)     Start     Ordered   04/14/23 0500  CBC  Tomorrow morning,   R        04/13/23 0623   04/14/23 0500  Basic metabolic panel  Tomorrow morning,   R        04/13/23 0623            Vitals/Pain Today's Vitals   04/13/23 1251 04/13/23 1252 04/13/23 1318 04/13/23 1321  BP:   (!) 145/80 (!) 145/80  Pulse:   66 65  Resp:   19 18  Temp:   (!) 97.1 F (36.2 C) 97.7 F (36.5 C)  TempSrc:   Oral Oral  SpO2:  100% 100% 96%  Weight:      Height:      PainSc: 0-No pain  0-No pain     Isolation Precautions No active isolations  Medications Medications  oxyCODONE (  Oxy IR/ROXICODONE) immediate release tablet 5 mg (has no administration in time range)   polyethylene glycol (MIRALAX / GLYCOLAX) packet 17 g (has no administration in time range)  citalopram (CELEXA) tablet 20 mg (20 mg Oral Given 04/13/23 1121)  albuterol (PROVENTIL) (2.5 MG/3ML) 0.083% nebulizer solution 2.5 mg (has no administration in time range)  traZODone (DESYREL) tablet 50 mg (has no administration in time range)  acetaminophen (TYLENOL) tablet 1,000 mg (1,000 mg Oral Given 04/13/23 1121)  pantoprazole (PROTONIX) EC tablet 40 mg (40 mg Oral Given 04/13/23 1121)  diclofenac Sodium (VOLTAREN) 1 % topical gel 4 g (4 g Topical Not Given 04/13/23 1253)  0.9 %  sodium chloride infusion (Manually program via Guardrails IV Fluids) ( Intravenous New Bag/Given 04/13/23 0629)  acetaminophen (TYLENOL) tablet 1,000 mg (1,000 mg Oral Given 04/13/23 0523)  diphenhydrAMINE (BENADRYL) capsule 25 mg (25 mg Oral Given 04/13/23 0523)  potassium chloride SA (KLOR-CON M) CR tablet 40 mEq (40 mEq Oral Given 04/13/23 0641)  HYDROmorphone (DILAUDID) injection 0.5 mg (0.5 mg Intravenous Given 04/13/23 0958)  lidocaine (XYLOCAINE) 2 % (with pres) injection 400 mg (400 mg Intradermal Given 04/13/23 1000)    Mobility No. Patient broke her right ankle.     Focused Assessments Hemoglobin low    R Recommendations: See Admitting Provider Note  Report given to:   Additional Notes: received 2 unit of blood

## 2023-04-13 NOTE — Progress Notes (Signed)
Orthopedic Tech Progress Note Patient Details:  AZALEIGH SIPE [birth year] [id number]  Ortho Devices Type of Ortho Device: Stirrup splint, Short leg splint Ortho Device/Splint Location: RLE Ortho Device/Splint Interventions: Ordered, Application, Adjustment   Post Interventions Patient Tolerated: Well Instructions Provided: Care of device Splint applied post reduction. Darleen Crocker 04/13/2023, 10:43 AM

## 2023-04-13 NOTE — ED Triage Notes (Signed)
Pt BIB EMS from home with c/o epistaxis and LOC. C/o right knee and right ankle pain. Pt has HHT and gets weekly blood transfusions.   HR 40-50s 80/40

## 2023-04-13 NOTE — ED Notes (Signed)
Date and time results received: 04/13/23 0410 (use smartphrase ".now" to insert current time)  Test: hemoglobin  Critical Value: 5.1  Name of Provider Notified: Randol Kern, MD  Orders Received? Or Actions Taken?:  n/a

## 2023-04-13 NOTE — Consult Note (Signed)
ORTHOPAEDIC CONSULTATION  REQUESTING PHYSICIAN: Miguel Aschoff, MD  Chief Complaint: Right ankle fracture  HPI: Peggy House is a 62 y.o. female with  with significant past medical history of hereditary hemorrhagic telangiectasia, chronic anemia, hypertension, hyperlipidemia, GI bleed, arthritis, tobacco use disorder, and type 2 diabetes presenting following a syncopal episode at 0300 on 04/13/2023. She states she has had several nose bleeds and felt light headed and fell this morning. She is complaining of pain in her right ankle.  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   Past Surgical History:  Procedure Laterality Date   ABDOMINAL HYSTERECTOMY     CARPAL TUNNEL RELEASE  05/13/2011   Procedure: CARPAL TUNNEL RELEASE;  Surgeon: Mable Paris, MD;  Location: [facility];  Service: Orthopedics;  Laterality: Left;   COLONOSCOPY N/A 03/02/2020   Procedure: COLONOSCOPY;  Surgeon: Bernette Redbird, MD;  Location: [facility] ENDOSCOPY;  Service: Endoscopy;  Laterality: N/A;   COLONOSCOPY WITH PROPOFOL N/A 04/28/2014   Procedure: COLONOSCOPY WITH PROPOFOL;  Surgeon: Florencia Reasons, MD;  Location: [facility] ENDOSCOPY;  Service: Endoscopy;  Laterality: N/A;   DG TOES*L*  2/10   rt   DILATION AND CURETTAGE OF UTERUS     ENTEROSCOPY N/A 10/17/2017   Procedure: ENTEROSCOPY;  Surgeon: Kathi Der, MD;  Location: [facility] ENDOSCOPY;  Service: Gastroenterology;  Laterality: N/A;   ESOPHAGOGASTRODUODENOSCOPY N/A 04/10/2014   Procedure: ESOPHAGOGASTRODUODENOSCOPY (EGD);  Surgeon: Shirley Friar, MD;  Location: [facility] ENDOSCOPY;  Service:  Endoscopy;  Laterality: N/A;   ESOPHAGOGASTRODUODENOSCOPY N/A 05/10/2017   Procedure: ESOPHAGOGASTRODUODENOSCOPY (EGD);  Surgeon: Bernette Redbird, MD;  Location: [facility] ENDOSCOPY;  Service: Endoscopy;  Laterality: N/A;   ESOPHAGOGASTRODUODENOSCOPY N/A 09/22/2017   Procedure: ESOPHAGOGASTRODUODENOSCOPY (EGD);  Surgeon: Vida Rigger, MD;  Location: [facility] ENDOSCOPY;  Service: Endoscopy;  Laterality: N/A;  bedside   ESOPHAGOGASTRODUODENOSCOPY N/A 03/02/2020   Procedure: ESOPHAGOGASTRODUODENOSCOPY (EGD);  Surgeon: Bernette Redbird, MD;  Location: [facility] ENDOSCOPY;  Service: Endoscopy;  Laterality: N/A;   ESOPHAGOGASTRODUODENOSCOPY N/A 11/03/2020   Procedure: ESOPHAGOGASTRODUODENOSCOPY (EGD);  Surgeon: Charlott Rakes, MD;  Location: [facility] ENDOSCOPY;  Service: Endoscopy;  Laterality: N/A;   ESOPHAGOGASTRODUODENOSCOPY N/A 04/12/2022   Procedure: ESOPHAGOGASTRODUODENOSCOPY (EGD);  Surgeon: Vida Rigger, MD;  Location: [facility] ENDOSCOPY;  Service: Gastroenterology;  Laterality: N/A;   ESOPHAGOGASTRODUODENOSCOPY N/A 05/27/2022   Procedure: ESOPHAGOGASTRODUODENOSCOPY (EGD);  Surgeon: Willis Modena, MD;  Location: [facility] ENDOSCOPY;  Service: Gastroenterology;  Laterality: N/A;   ESOPHAGOGASTRODUODENOSCOPY N/A 09/27/2022   Procedure: ESOPHAGOGASTRODUODENOSCOPY (EGD);  Surgeon: Lynann Bologna, DO;  Location: [facility] ENDOSCOPY;  Service: Gastroenterology;  Laterality: N/A;   ESOPHAGOGASTRODUODENOSCOPY (EGD) WITH PROPOFOL N/A 04/27/2014   Procedure: ESOPHAGOGASTRODUODENOSCOPY (EGD) WITH PROPOFOL;  Surgeon: Florencia Reasons, MD;  Location: [facility] ENDOSCOPY;  Service: Endoscopy;  Laterality: N/A;  possible apc   ESOPHAGOGASTRODUODENOSCOPY (EGD) WITH PROPOFOL N/A 09/30/2017   Procedure: ESOPHAGOGASTRODUODENOSCOPY (EGD) WITH PROPOFOL;  Surgeon: Kerin Salen, MD;  Location: [facility] ENDOSCOPY;  Service: Gastroenterology;  Laterality: N/A;   ESOPHAGOGASTRODUODENOSCOPY (EGD) WITH PROPOFOL N/A 10/01/2017   Procedure: ESOPHAGOGASTRODUODENOSCOPY (EGD) WITH  PROPOFOL;  Surgeon: Kerin Salen, MD;  Location: [facility] ENDOSCOPY;  Service: Gastroenterology;  Laterality:  N/A;   ESOPHAGOGASTRODUODENOSCOPY (EGD) WITH PROPOFOL N/A 10/08/2017   Procedure: ESOPHAGOGASTRODUODENOSCOPY (EGD) WITH PROPOFOL;  Surgeon: Kathi Der, MD;  Location: [facility] ENDOSCOPY;  Service: Gastroenterology;  Laterality: N/A;   ESOPHAGOGASTRODUODENOSCOPY (EGD) WITH PROPOFOL N/A 10/17/2017   Procedure: ESOPHAGOGASTRODUODENOSCOPY (EGD) WITH PROPOFOL;  Surgeon: Kathi Der, MD;  Location: [facility] ENDOSCOPY;  Service: Gastroenterology;  Laterality: N/A;   ESOPHAGOGASTRODUODENOSCOPY (EGD) WITH PROPOFOL N/A 10/19/2017   Procedure: ESOPHAGOGASTRODUODENOSCOPY (EGD) WITH PROPOFOL;  Surgeon: Kathi Der, MD;  Location: [facility] ENDOSCOPY;  Service: Gastroenterology;  Laterality: N/A;   ESOPHAGOGASTRODUODENOSCOPY (EGD) WITH PROPOFOL N/A 12/04/2018   Procedure: ESOPHAGOGASTRODUODENOSCOPY (EGD) WITH PROPOFOL;  Surgeon: Charlott Rakes, MD;  Location: [facility] ENDOSCOPY;  Service: Endoscopy;  Laterality: N/A;   GIVENS CAPSULE STUDY N/A 10/02/2017   Procedure: GIVENS CAPSULE STUDY;  Surgeon: Kerin Salen, MD;  Location: [facility] ENDOSCOPY;  Service: Gastroenterology;  Laterality: N/A;   GIVENS CAPSULE STUDY N/A 10/08/2017   Procedure: GIVENS CAPSULE STUDY;  Surgeon: Kathi Der, MD;  Location: [facility] ENDOSCOPY;  Service: Gastroenterology;  Laterality: N/A;  endoscopic placement of capsule   GIVENS CAPSULE STUDY N/A 03/02/2020   Procedure: GIVENS CAPSULE STUDY;  Surgeon: Bernette Redbird, MD;  Location: [facility] ENDOSCOPY;  Service: Endoscopy;  Laterality: N/A;   HEMOSTASIS CLIP PLACEMENT  12/04/2018   Procedure: HEMOSTASIS CLIP PLACEMENT;  Surgeon: Charlott Rakes, MD;  Location: [facility] ENDOSCOPY;  Service: Endoscopy;;   HEMOSTASIS CLIP PLACEMENT  05/27/2022   Procedure: HEMOSTASIS CLIP PLACEMENT;  Surgeon: Willis Modena, MD;  Location: [facility] ENDOSCOPY;  Service: Gastroenterology;;   HEMOSTASIS CLIP PLACEMENT  09/27/2022    Procedure: HEMOSTASIS CLIP PLACEMENT;  Surgeon: Lynann Bologna, DO;  Location: [facility] ENDOSCOPY;  Service: Gastroenterology;;   HEMOSTASIS CONTROL  05/27/2022   Procedure: HEMOSTASIS CONTROL;  Surgeon: Willis Modena, MD;  Location: [facility] ENDOSCOPY;  Service: Gastroenterology;;   HOT HEMOSTASIS N/A 04/27/2014   Procedure: HOT HEMOSTASIS (ARGON PLASMA COAGULATION/BICAP);  Surgeon: Florencia Reasons, MD;  Location: [facility] ENDOSCOPY;  Service: Endoscopy;  Laterality: N/A;   HOT HEMOSTASIS N/A 09/30/2017   Procedure: HOT HEMOSTASIS (ARGON PLASMA COAGULATION/BICAP);  Surgeon: Kerin Salen, MD;  Location: [facility] ENDOSCOPY;  Service: Gastroenterology;  Laterality: N/A;   HOT HEMOSTASIS N/A 10/01/2017   Procedure: HOT HEMOSTASIS (ARGON PLASMA COAGULATION/BICAP);  Surgeon: Kerin Salen, MD;  Location: [facility] ENDOSCOPY;  Service: Gastroenterology;  Laterality: N/A;   HOT HEMOSTASIS N/A 10/17/2017   Procedure: HOT HEMOSTASIS (ARGON PLASMA COAGULATION/BICAP);  Surgeon: Kathi Der, MD;  Location: [facility] ENDOSCOPY;  Service: Gastroenterology;  Laterality: N/A;   HOT HEMOSTASIS N/A 10/19/2017   Procedure: HOT HEMOSTASIS (ARGON PLASMA COAGULATION/BICAP);  Surgeon: Kathi Der, MD;  Location: [facility] ENDOSCOPY;  Service: Gastroenterology;  Laterality: N/A;   HOT HEMOSTASIS N/A 03/02/2020   Procedure: HOT HEMOSTASIS (ARGON PLASMA COAGULATION/BICAP);  Surgeon: Bernette Redbird, MD;  Location: [facility] ENDOSCOPY;  Service: Endoscopy;  Laterality: N/A;   HOT HEMOSTASIS N/A 04/12/2022   Procedure: HOT HEMOSTASIS (ARGON PLASMA COAGULATION/BICAP);  Surgeon: Vida Rigger, MD;  Location: [facility] ENDOSCOPY;  Service: Gastroenterology;  Laterality: N/A;   IR IMAGING GUIDED PORT INSERTION  07/08/2018   L shoulder Surgery  2011   POLYPECTOMY  03/02/2020   Procedure: POLYPECTOMY;  Surgeon: Bernette Redbird, MD;  Location: [facility] ENDOSCOPY;  Service: Endoscopy;;   Susa Day  11/03/2020   Procedure: Susa Day;  Surgeon: Charlott Rakes, MD;  Location: [facility]  ENDOSCOPY;  Service: Endoscopy;;   SUBMUCOSAL INJECTION  09/22/2017   Procedure: SUBMUCOSAL INJECTION;  Surgeon: Vida Rigger, MD;  Location: [facility] ENDOSCOPY;  Service: Endoscopy;;   SUBMUCOSAL INJECTION  12/04/2018   Procedure: SUBMUCOSAL INJECTION;  Surgeon: Charlott Rakes, MD;  Location: [facility] ENDOSCOPY;  Service: Endoscopy;;   Social History   Socioeconomic History   Marital status: Widowed    Spouse name: Not on file   Number of children: 3   Years of education: Not on file   Highest education level: Not on file  Occupational History   Not on file  Tobacco Use   Smoking status: Every Day    Current packs/day: 0.50    Average packs/day: 0.5 packs/day for 30.0 years (15.0 ttl pk-yrs)    Types: Cigarettes   Smokeless tobacco: Former    Types: Snuff    Quit date: 68   Tobacco comments:    1/2 PPD  Vaping Use   Vaping status: Never Used  Substance and Sexual Activity   Alcohol use: No    Alcohol/week: 0.0 standard drinks of alcohol   Drug use: No    Comment: last cocaine-2010   Sexual activity: Yes    Birth control/protection: Surgical    Comment: Hysterectomy  Other Topics Concern   Not on file  Social History Narrative   Not on file   Social Drivers of Health   Financial Resource Strain: Not on file  Food Insecurity: No Food Insecurity (01/31/2023)   Hunger Vital Sign    Worried About Running Out of Food in the Last Year: Never true    Ran Out of Food in the Last Year: Never true  Recent Concern: Food Insecurity - Food Insecurity Present (11/16/2022)   Hunger Vital Sign    Worried About Programme researcher, broadcasting/film/video in the Last Year: Sometimes true    Ran Out of Food in the Last Year: Sometimes true  Transportation Needs: Unmet Transportation Needs (01/31/2023)   PRAPARE - Administrator, Civil Service (Medical): Yes    Lack of Transportation (Non-Medical): Yes  Physical Activity: Not on file  Stress: Not on file  Social Connections: Not on file   Family  History  Problem Relation Age of Onset   Cancer Mother        Ovarian   Ovarian cancer Mother    Diabetes Mother    Kidney disease Mother    Bleeding Disorder Mother    Diabetes type II Sister    Bleeding Disorder Sister    Diabetes Sister    Colon cancer Maternal Grandfather    Crohn's disease Maternal Grandfather    Stomach cancer Maternal Grandmother    Kidney disease Son    Bleeding Disorder Son    Dysmenorrhea Neg Hx    Allergies  Allergen Reactions   Feraheme [Ferumoxytol] Other (See Comments)    Muscle tetany, encephalopathy, fatigue, nausea (reaction to injection)   Nsaids Other (See Comments)    Stomach bleeding episodes; Tylenol is OK   Tomato Hives   Iron (Ferrous Sulfate) [Ferrous Sulfate Er] Other (See Comments)    Shock   Wasp Venom Swelling   Prior to Admission medications   Medication Sig Start Date End Date Taking? Authorizing Provider  Accu-Chek Softclix Lancets lancets Use to check blood sugar before breakfast and before dinner while on steroids Patient taking differently: 1 each by Other route See admin instructions. Use to check blood sugar before breakfast and before dinner while on steroids 09/27/19  Yes Claudean Severance, MD  ALPRAZolam Prudy Feeler) 0.5 MG tablet TAKE 1 TABLET BY MOUTH AT BEDTIME AS NEEDED FOR ANXIETY OR SLEEP 12/07/22  Yes Malachy Mood, MD  aminocaproic acid (AMICAR) 500 MG tablet Take 2 tablets (1,000 mg total) by mouth every 12 (twelve) hours. 02/21/23  Yes Boscia, Heather E, NP  amLODipine (NORVASC) 10 MG tablet TAKE 1 TABLET(10 MG) BY MOUTH DAILY Patient taking differently: Take 10 mg by mouth daily. 06/12/22  Yes Pollyann Samples, NP  citalopram (CELEXA) 20 MG tablet Take 1 tablet (20 mg total) by mouth daily. 06/20/22 06/20/23 Yes Nooruddin, Jason Fila, MD  glucose blood (ACCU-CHEK GUIDE) test strip Check blood sugar 2 times per day while on steroids before breakfast and dinner Patient taking differently: 1 each by Other route See admin instructions.  Check blood sugar 2 times per day while on steroids before breakfast and dinner 09/27/19  Yes Claudean Severance, MD  lisinopril (ZESTRIL) 20 MG tablet Take 20 mg by mouth daily. 12/05/22  Yes [provider]  metFORMIN (GLUCOPHAGE) 500 MG tablet TAKE 1 TABLET(500 MG) BY MOUTH TWICE DAILY WITH A MEAL 01/24/23  Yes Nooruddin, Jason Fila, MD  mirtazapine (REMERON) 7.5 MG tablet Take 1 tablet (7.5 mg total) by mouth at bedtime. 02/21/23  Yes Boscia, Heather E, NP  nicotine (NICODERM CQ - DOSED IN MG/24 HOURS) 14 mg/24hr patch Place 1 patch (14 mg total) onto the skin daily. Patient taking differently: Place 14 mg onto the skin daily. 06/20/22 06/20/23 Yes Nooruddin, Jason Fila, MD  ondansetron (ZOFRAN) 4 MG tablet Take 1 tablet (4 mg total) by mouth every 8 (eight) hours as needed for nausea or vomiting. 02/21/23  Yes Boscia, Heather E, NP  pantoprazole (PROTONIX) 40 MG tablet TAKE 1 TABLET(40 MG) BY MOUTH TWICE DAILY Patient taking differently: Take 40 mg by mouth daily. 09/30/22  Yes Malachy Mood, MD  PROAIR HFA 108 [phone] Base) MCG/ACT inhaler INHALE 1 TO 2 PUFFS INTO THE LUNGS EVERY 6 HOURS AS NEEDED FOR WHEEZING OR SHORTNESS OF BREATH Patient taking differently: Inhale 1-2 puffs into the lungs every 6 (six) hours as needed for wheezing or shortness of breath. 11/10/20  Yes Malachy Mood, MD  traZODone (DESYREL) 100 MG tablet TAKE 1 TABLET BY MOUTH AT BEDTIME 03/19/23  Yes Boscia, Heather E, NP  TYLENOL PM EXTRA STRENGTH 500-25 MG TABS tablet Take 2 tablets by mouth at bedtime as needed (for sleep, knee pain).   Yes [provider]    Family History Reviewed and non-contributory, no pertinent history of problems with bleeding or anesthesia      Review of Systems 14 system ROS conducted and negative except for that noted in HPI   OBJECTIVE  Vitals:Patient Vitals for the past 8 hrs:  BP Temp Temp src Pulse Resp SpO2 Height Weight  04/13/23 0909 (!) 161/72 98.2 F (36.8 C) Oral 66 19 100 % -- --   04/13/23 0717 -- 97.8 F (36.6 C) Oral -- -- -- -- --  04/13/23 0700 137/63 -- -- 65 18 100 % -- --  04/13/23 0638 (!) 140/67 98 F (36.7 C) Oral 62 19 100 % -- --  04/13/23 0630 (!) 120/59 -- -- 64 17 100 % -- --  04/13/23 0622 (!) 139/59 97.9 F (36.6 C) -- 64 16 100 % -- --  04/13/23 0600 120/61 -- -- 65 18 100 % -- --  04/13/23 0545 (!) 125/55 -- -- 72 20 100 % -- --  04/13/23 0530 (!) 138/93 -- -- (!) 59 15 100 % -- --  04/13/23 0500 122/64 -- -- (!) 59 15 100 % -- --  04/13/23 0445 (!) 128/55 -- -- 60 15  100 % -- --  04/13/23 0430 124/69 -- -- 60 20 100 % -- --  04/13/23 0400 115/63 -- -- 62 16 100 % -- --  04/13/23 0354 -- -- -- 60 19 100 % -- --  04/13/23 0345 (!) 115/58 -- -- (!) 59 19 98 % -- --  04/13/23 0330 (!) 100/55 -- -- (!) 50 (!) 21 100 % -- --  04/13/23 0314 98/61 98 F (36.7 C) -- (!) 59 18 100 % -- --  04/13/23 0313 -- -- -- -- -- -- 5\' 3"  (1.6 m) 104.1 kg   General: Alert, no acute distress Cardiovascular: Warm extremities noted Respiratory: No cyanosis, no use of accessory musculature GI: No organomegaly, abdomen is soft and non-tender Skin: No lesions in the area of chief complaint other than those listed below in MSK exam.  Neurologic: Sensation intact distally save for the below mentioned MSK exam Psychiatric: Patient is competent for consent with normal mood and affect Lymphatic: No swelling obvious and reported other than the area involved in the exam below Extremities   RLE: Skin intact no open wounds Obvious deformity of ankle Moderate swelling of ankle and foot Tender to palpation medial and lateral ankle with palpable crepitus Non tender in knee or foot Sensation intact sp/dp/tn but slightly diminished at baseline due to DM Motor intact EHL/FHL Palpable DP and PT pulses  Test Results Imaging CT HEAD WO CONTRAST Result Date: 04/13/2023 CLINICAL DATA:  62 year old female with history of lightheadedness and unwitnessed fall. EXAM: CT HEAD  WITHOUT CONTRAST TECHNIQUE: Contiguous axial images were obtained from the base of the skull through the vertex without intravenous contrast. RADIATION DOSE REDUCTION: This exam was performed according to the departmental dose-optimization program which includes automated exposure control, adjustment of the mA and/or kV according to patient size and/or use of iterative reconstruction technique. COMPARISON:  Head CT 08/01/2007. FINDINGS: Brain: Physiologic calcifications in the basal ganglia bilaterally. No evidence of acute infarction, hemorrhage, hydrocephalus, extra-axial collection or mass lesion/mass effect. Vascular: No hyperdense vessel or unexpected calcification. Skull: Normal. Negative for fracture or focal lesion. Sinuses/Orbits: No acute finding. Small mucosal retention cyst or polyp in the medial aspect of the left maxillary sinus, similar to remote prior study. Other: None. IMPRESSION: 1. No evidence of significant acute traumatic injury to the skull or brain. Electronically Signed   By: Trudie Reed M.D.   On: 04/13/2023 08:56   DG Ankle Complete Right Result Date: 04/13/2023 CLINICAL DATA:  62 year old female with history of trauma from a fall complaining of right ankle pain. EXAM: RIGHT ANKLE - COMPLETE 3+ VIEW COMPARISON:  No priors. FINDINGS: Acute fracture/subluxation of the right ankle is noted. Specifically, there is disruption of the normal ankle mortise which is widened approximately 1.2 cm medially, secondary to lateral subluxation of the talus relative to the tibia. Mildly displaced fracture of the medial malleolus (7 mm) is noted. There is also a fracture of the lateral malleolus which is minimally displaced (3 mm laterally) and minimally angulated (approximately 20 degrees of lateral angulation). Irregularity of the posterior malleolus of the distal tibia is also noted, which could suggest a nondisplaced posterior malleolar fracture. Soft tissues are mildly swollen surrounding the  ankle joint. Multifocal joint space narrowing, subchondral sclerosis, subchondral cyst formation and osteophyte formation is noted throughout the visualized portions of the midfoot and hindfoot, indicative of osteoarthritis. IMPRESSION: 1. Acute fracture/subluxation of the right ankle, favored to be trimalleolar, as detailed above. Electronically Signed   By: Reuel Boom  Entrikin M.D.   On: 04/13/2023 06:54   Labs cbc Recent Labs    04/13/23 0327  WBC 14.5*  HGB 5.1*  HCT 18.0*  PLT 201    Labs inflam No results for input(s): "CRP" in the last 72 hours.  Invalid input(s): "ESR"  Labs coag Recent Labs    04/13/23 0327  INR 1.2    Recent Labs    04/13/23 0327  NA 140  K 3.2*  CL 112*  CO2 20*  GLUCOSE 142*  BUN 8  CREATININE 1.18*  CALCIUM 8.6*     ASSESSMENT AND PLAN: 62 y.o. female with the following: right ankle fracture dislocation  This patient requires inpatient admission to manage this problem appropriately. Orthopedics recommends admission to a medical service and we will provide consultation and follow along  She will require surgical fixation for her ankle once her anemia is improved and her swelling decreases. She will likely require a Hgb>7 prior to surgery.  - Weight Bearing Status/Activity: Non-weight bearing right lower extremity  - Additional recommended labs/tests:   -VTE Prophylaxis: Per primary team  - Pain control: per primary team, recommend scheduled non narcotic medications and PRN oral pain medication for severe pain -Aggressive ice and elevation for RLE for swelling

## 2023-04-13 NOTE — Hospital Course (Addendum)
Peggy House is a 62 y.o. woman with a PMH of hereditary hemorrhagic telangiectasia, chronic iron deficiency anemia, type 2 diabetes, tobacco use disorder, GAD who presented following a syncopal episode and admitted for symptomatic anemia and R ankle fracture.  Symptomatic anemia Hereditary hemorrhagic telangiectasia She presented with a hemoglobin of 5. She experiences frequent epistaxis and requires weekly transfusions outpatient with difficultly maintaining Hg above 8.   While hospitalized, received blood transfusion, one-time dose of FFP.  We discontinued chemoprophylaxis with Xarelto due to high risk of bleeding, transition to SCDs. - Monitor   Trimalleolar R Ankle Fracture Ankle fracture occurred in setting of her fall/syncope. Orthopedics performed closed reduction Sunday 12/29, allowed time for swelling to improve, and performed ORIF on 1/9. She is to be non weight bearing of the RLE for 8-12 weeks thereafter.  Patient was discharged to inpatient rehab.  - Discharged with a 5-day course of oxycodone 10 mg every 4 hours. - Follow-up with orthopedics outpatient.  Diabetes -restarted metformin on the day of discharge.  Hypertension -Held lisinopril on admission due to AKI, and continue amlodipine.  AKI resolved, patient is back on lisinopril and amlodipine for blood pressure control.

## 2023-04-13 NOTE — Progress Notes (Signed)
HD#0 SUBJECTIVE:  Patient Summary: Peggy House is a 62 y.o. with a pertinent PMH of hereditary hemorrhagic telangiectasia, chronic anemia, hypertension, hyperlipidemia, GI bleed, and type 2 diabetes , who presented with syncopal episode and admitted for symptomatic anemia and R ankle fracture.   Overnight Events: None, admitted this AM.   Interim History: She notes continued pain in the R ankle. She notes recent nosebleeds this week. She is nervous about the necessary reduction and surgery for her ankle fracture. Otherwise she is without complaint.  OBJECTIVE:  Vital Signs: Vitals:   04/13/23 1252 04/13/23 1318 04/13/23 1321 04/13/23 1438  BP:  (!) 145/80 (!) 145/80 (!) 164/84  Pulse:  66 65 65  Resp:  19 18 18   Temp:  (!) 97.1 F (36.2 C) 97.7 F (36.5 C) 98.3 F (36.8 C)  TempSrc:  Oral Oral   SpO2: 100% 100% 96% 100%  Weight:      Height:       Supplemental O2: Room Air SpO2: 100 %  Filed Weights   04/13/23 0313  Weight: 104.1 kg     Intake/Output Summary (Last 24 hours) at 04/13/2023 1824 Last data filed at 04/13/2023 1534 Gross per 24 hour  Intake 995.17 ml  Output --  Net 995.17 ml   Net IO Since Admission: 995.17 mL [04/13/23 1824]  Physical Exam: Physical Exam Constitutional:      General: She is not in acute distress.    Appearance: She is not ill-appearing.  HENT:     Head: Normocephalic.  Cardiovascular:     Rate and Rhythm: Normal rate.     Heart sounds: Murmur heard.     Comments: Systolic murmur upper sternal border Pulmonary:     Effort: Pulmonary effort is normal.  Musculoskeletal:     Right lower leg: Edema present.     Left lower leg: No edema.     Comments: Pain with palpation of the right ankle diffusely. No pain with palpation of the right knee. There is moderate edema. There are no skin changes.  Skin:    General: Skin is warm.     Capillary Refill: Capillary refill takes less than 2 seconds.  Neurological:     Mental  Status: She is alert. Mental status is at baseline.  Psychiatric:        Mood and Affect: Mood normal.        Behavior: Behavior normal.     Patient Lines/Drains/Airways Status     Active Line/Drains/Airways     Name Placement date Placement time Site Days   Implanted Port 07/08/18 Right Chest 07/08/18  --  Chest  1740   Peripheral IV 04/13/23 18 G Left Antecubital 04/13/23  0319  Antecubital  less than 1             ASSESSMENT/PLAN:  Assessment: Principal Problem:   Symptomatic anemia Active Problems:   Anxiety   Leg swelling   Essential hypertension   Iron deficiency anemia   Recurrent epistaxis   Type 2 diabetes mellitus with vascular disease (HCC)   Syncope   HHT (hereditary hemorrhagic telangiectasia) (HCC)   Tobacco use disorder  Peggy House is a 62 y.o. person living with a history of hereditary hemorrhagic telangiectasia, chronic iron deficiency anemia, type 2 diabetes, tobacco use disorder, GAD who presented following a syncopal episode and admitted for symptomatic anemia.  Plan: #Symptomatic anemia #Hereditary hemorrhagic telangiectasia Admitting Hg 5.1, now s/p 2u PRBCs Hg 7.3. Patient with known history of  multiple bleeding events, including history of GI bleeding, but denies any symptoms of GI bleeding.  Bleeding primarily seems to have been secondary to epistaxis. - Monitor Hg, keep above 7. Monitor for signs of bleeding. For recurrent nosebleeds, attempt control with cold packs and pressure. If persistent, consider ENT vs silver nitrate.   #Syncope #Systolic Cardiac murmur Syncopal episode likely secondary to orthostatic hypotension, but unclear etiology.  Concern would be if patient could have worsening aortic stenosis which could lead to syncope.  Unclear if true loss of consciousness.  Unclear if patient hit her head.  Patient does have a murmur, which she states is not new.  However, no significant valvular pathology on last echo in 2019.  Would expect tachycardia with symptomatic anemia, however was actually bradycardic on presentation. - Update echo in setting of murmur, rule out cardiac syncope. - Telemetry - CT head without acute process - Holding home BP regiment   Trimalleolar R Ankle Fracture Ankle fracture due to fall from syncopy. Orthopedics involved. Pain rated at a 10 out of 10 intensity. Plan for reduction today. Plan for surgery in a few days when swelling has reduced and Hg above 7. No weight bearing. - Orthopedics following, thank you - Oxycodone as needed for pain - Scheduled Tylenol 1 g 3 times daily, oxycodone 5mg  q4h prn, dilaudid 0.5mg  q3 prn. For bowel regimen, miralax prn. - Voltaren gel as needed   Acute kidney injury Creatinine elevated to 1.18, baseline around 0.80.  Favored to be prerenal in nature in setting of epistaxis. Plan: - Daily BMP -Consider IV fluid resuscitation in addition to blood products.   Hypokalemia Hypokalemic to 3.2. Plan: - Replete potassium p.o. - Mag level   Leukocytosis  Likely a stress reaction. No signs of infection.  Does endorse polyuria and polydipsia, but denies dysuria.  Denies fevers, endorses chills though states this is chronic for her.  Denies URI symptoms. Plan: - Trend CBC, consider differential - Consider UA - Monitor for sick symptoms   Type 2 diabetes Last A1c 02/01/2023, 5.0.  She seems to be well-controlled for many years.  Plan: - Hold metformin in setting of AKI - CBG   Tobacco use disorder Half pack a day smoker. Plan: - Will hold nicotine patch in setting of possible surgery -No PFTs on file to formally diagnose COPD, resume home albuterol   GAD Resume PTA patient monitored trazodone, Remeron, Celexa  Best Practice: Diet: Cardiac diet IVF: None VTE: SCDs Start: 04/13/23 0620 Code: DNR/DNI AB: None DISPO: Anticipated discharge in 4 days to pending  Surgery .  Signature: Katheran James, D.O.  Internal Medicine Resident,  PGY-1 [facility]  Pager: [phone] 6:24 PM, 04/13/2023   Please contact the on call pager after 5 pm and on weekends at [phone].

## 2023-04-13 NOTE — ED Provider Notes (Signed)
[facility] Emergency Department Provider Note MRN:  [id number]  Arrival date & time: 04/13/23     Chief Complaint   Loss of Consciousness   History of Present Illness   Peggy House is a 62 y.o. year-old female with a history of hereditary hemorrhagic telangiectasia presenting to the ED with chief complaint of loss of consciousness.  Feeling weak, lightheaded upon standing for the past 2 days.  Occasional nosebleeding.  Denies rectal bleeding or black stool, no other sources of bleeding.  Had 1 passing out spell.  Denies chest pain or shortness of breath.  Review of Systems  A thorough review of systems was obtained and all systems are negative except as noted in the HPI and PMH.   Patient's Health History    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    Past Surgical History:  Procedure Laterality Date   ABDOMINAL HYSTERECTOMY     CARPAL TUNNEL RELEASE  05/13/2011   Procedure: CARPAL TUNNEL RELEASE;  Surgeon: Mable Paris, MD;  Location: [facility];  Service: Orthopedics;  Laterality: Left;   COLONOSCOPY N/A 03/02/2020   Procedure: COLONOSCOPY;  Surgeon: Bernette Redbird, MD;  Location: [facility] ENDOSCOPY;  Service: Endoscopy;  Laterality: N/A;   COLONOSCOPY WITH PROPOFOL N/A 04/28/2014   Procedure: COLONOSCOPY WITH PROPOFOL;  Surgeon: Florencia Reasons, MD;  Location: [facility] ENDOSCOPY;  Service: Endoscopy;  Laterality: N/A;   DG TOES*L*  2/10   rt   DILATION AND CURETTAGE OF UTERUS     ENTEROSCOPY N/A 10/17/2017   Procedure: ENTEROSCOPY;  Surgeon: Kathi Der, MD;  [facility]  ENDOSCOPY;  Service: Gastroenterology;  Laterality: N/A;   ESOPHAGOGASTRODUODENOSCOPY N/A 04/10/2014   Procedure: ESOPHAGOGASTRODUODENOSCOPY (EGD);  Surgeon: Shirley Friar, MD;  Location: [facility] ENDOSCOPY;  Service: Endoscopy;  Laterality: N/A;   ESOPHAGOGASTRODUODENOSCOPY N/A 05/10/2017   Procedure: ESOPHAGOGASTRODUODENOSCOPY (EGD);  Surgeon: Bernette Redbird, MD;  Location: [facility]COPY;  Service: Endoscopy;  Laterality: N/A;   ESOPHAGOGASTRODUODENOSCOPY N/A 09/22/2017   Procedure: ESOPHAGOGASTRODUODENOSCOPY (EGD);  Surgeon: Vida Rigger, MD;  Location: [facility] ENDOSCOPY;  Service: Endoscopy;  Laterality: N/A;  bedside   ESOPHAGOGASTRODUODENOSCOPY N/A 03/02/2020   Procedure: ESOPHAGOGASTRODUODENOSCOPY (EGD);  Surgeon: Bernette Redbird, MD;  Location: [facility] ENDOSCOPY;  Service: Endoscopy;  Laterality: N/A;   ESOPHAGOGASTRODUODENOSCOPY N/A 11/03/2020   Procedure: ESOPHAGOGASTRODUODENOSCOPY (EGD);  Surgeon: Charlott Rakes, MD;  Location: [facility] ENDOSCOPY;  Service: Endoscopy;  Laterality: N/A;   ESOPHAGOGASTRODUODENOSCOPY N/A 04/12/2022   Procedure: ESOPHAGOGASTRODUODENOSCOPY (EGD);  Surgeon: Vida Rigger, MD;  Location: [facility] ENDOSCOPY;  Service: Gastroenterology;  Laterality: N/A;   ESOPHAGOGASTRODUODENOSCOPY N/A 05/27/2022   Procedure: ESOPHAGOGASTRODUODENOSCOPY (EGD);  Surgeon: Willis Modena, MD;  Location: [facility] ENDOSCOPY;  Service: Gastroenterology;  Laterality: N/A;   ESOPHAGOGASTRODUODENOSCOPY N/A 09/27/2022   Procedure: ESOPHAGOGASTRODUODENOSCOPY (EGD);  Surgeon: Lynann Bologna, DO;  Location: [facility] ENDOSCOPY;  Service: Gastroenterology;  Laterality: N/A;   ESOPHAGOGASTRODUODENOSCOPY (EGD) WITH PROPOFOL N/A 04/27/2014   Procedure: ESOPHAGOGASTRODUODENOSCOPY (EGD) WITH PROPOFOL;  Surgeon: Florencia Reasons, MD;  Location: [facility] ENDOSCOPY;  Service: Endoscopy;  Laterality: N/A;  possible apc   ESOPHAGOGASTRODUODENOSCOPY (EGD) WITH PROPOFOL N/A 09/30/2017  Procedure: ESOPHAGOGASTRODUODENOSCOPY (EGD) WITH PROPOFOL;   Surgeon: Kerin Salen, MD;  Location: [facility] ENDOSCOPY;  Service: Gastroenterology;  Laterality: N/A;   ESOPHAGOGASTRODUODENOSCOPY (EGD) WITH PROPOFOL N/A 10/01/2017   Procedure: ESOPHAGOGASTRODUODENOSCOPY (EGD) WITH PROPOFOL;  Surgeon: Kerin Salen, MD;  Location: [facility] ENDOSCOPY;  Service: Gastroenterology;  Laterality: N/A;   ESOPHAGOGASTRODUODENOSCOPY (EGD) WITH PROPOFOL N/A 10/08/2017   Procedure: ESOPHAGOGASTRODUODENOSCOPY (EGD) WITH PROPOFOL;  Surgeon: Kathi Der, MD;  [facility];  Service: Gastroenterology;  Laterality: N/A;   ESOPHAGOGASTRODUODENOSCOPY (EGD) WITH PROPOFOL N/A 10/17/2017   Procedure: ESOPHAGOGASTRODUODENOSCOPY (EGD) WITH PROPOFOL;  Surgeon: Kathi Der, MD;  [facility];  Service: Gastroenterology;  Laterality: N/A;   ESOPHAGOGASTRODUODENOSCOPY (EGD) WITH PROPOFOL N/A 10/19/2017   Procedure: ESOPHAGOGASTRODUODENOSCOPY (EGD) WITH PROPOFOL;  Surgeon: Kathi Der, MD;  [facility];  Service: Gastroenterology;  Laterality: N/A;   ESOPHAGOGASTRODUODENOSCOPY (EGD) WITH PROPOFOL N/A 12/04/2018   Procedure: ESOPHAGOGASTRODUODENOSCOPY (EGD) WITH PROPOFOL;  Surgeon: Charlott Rakes, MD;  Location: [facility] ENDOSCOPY;  Service: Endoscopy;  Laterality: N/A;   GIVENS CAPSULE STUDY N/A 10/02/2017   Procedure: GIVENS CAPSULE STUDY;  Surgeon: Kerin Salen, MD;  Location: [facility] ENDOSCOPY;  Service: Gastroenterology;  Laterality: N/A;   GIVENS CAPSULE STUDY N/A 10/08/2017   Procedure: GIVENS CAPSULE STUDY;  Surgeon: Kathi Der, MD;  [facility]OS[facility];  Service: Gastroenterology;  Laterality: N/A;  endoscopic placement of capsule   GIVENS CAPSULE STUDY N/A 03/02/2020   Procedure: GIVENS CAPSULE STUDY;  Surgeon: Bernette Redbird, MD;  Location: [facility] ENDOSCOPY;  Service: Endoscopy;  Laterality: N/A;   HEMOSTASIS CLIP PLACEMENT  12/04/2018   Procedure: HEMOSTASIS CLIP PLACEMENT;  Surgeon: Charlott Rakes, MD;  Location: [facility] ENDOSCOPY;  Service: Endoscopy;;    HEMOSTASIS CLIP PLACEMENT  05/27/2022   Procedure: HEMOSTASIS CLIP PLACEMENT;  Surgeon: Willis Modena, MD;  Location: [facility] ENDOSCOPY;  Service: Gastroenterology;;   HEMOSTASIS CLIP PLACEMENT  09/27/2022   Procedure: HEMOSTASIS CLIP PLACEMENT;  Surgeon: Lynann Bologna, DO;  Location: [facility] ENDOSCOPY;  Service: Gastroenterology;;   HEMOSTASIS CONTROL  05/27/2022   Procedure: HEMOSTASIS CONTROL;  Surgeon: Willis Modena, MD;  Location: [facility] ENDOSCOPY;  Service: Gastroenterology;;   HOT HEMOSTASIS N/A 04/27/2014   Procedure: HOT HEMOSTASIS (ARGON PLASMA COAGULATION/BICAP);  Surgeon: Florencia Reasons, MD;  Location: [facility] ENDOSCOPY;  Service: Endoscopy;  Laterality: N/A;   HOT HEMOSTASIS N/A 09/30/2017   Procedure: HOT HEMOSTASIS (ARGON PLASMA COAGULATION/BICAP);  Surgeon: Kerin Salen, MD;  Location: [facility] ENDOSCOPY;  Service: Gastroenterology;  Laterality: N/A;   HOT HEMOSTASIS N/A 10/01/2017   Procedure: HOT HEMOSTASIS (ARGON PLASMA COAGULATION/BICAP);  Surgeon: Kerin Salen, MD;  Location: [facility] ENDOSCOPY;  Service: Gastroenterology;  Laterality: N/A;   HOT HEMOSTASIS N/A 10/17/2017   Procedure: HOT HEMOSTASIS (ARGON PLASMA COAGULATION/BICAP);  Surgeon: Kathi Der, MD;  Location: [facility] ENDOSCOPY;  Service: Gastroenterology;  Laterality: N/A;   HOT HEMOSTASIS N/A 10/19/2017   Procedure: HOT HEMOSTASIS (ARGON PLASMA COAGULATION/BICAP);  Surgeon: Kathi Der, MD;  Location: [facility] ENDOSCOPY;  Service: Gastroenterology;  Laterality: N/A;   HOT HEMOSTASIS N/A 03/02/2020   Procedure: HOT HEMOSTASIS (ARGON PLASMA COAGULATION/BICAP);  Surgeon: Bernette Redbird, MD;  Location: [facility] ENDOSCOPY;  Service: Endoscopy;  Laterality: N/A;   HOT HEMOSTASIS N/A 04/12/2022   Procedure: HOT HEMOSTASIS (ARGON PLASMA COAGULATION/BICAP);  Surgeon: Vida Rigger, MD;  Location: [facility] ENDOSCOPY;  Service: Gastroenterology;  Laterality: N/A;   IR IMAGING GUIDED PORT INSERTION  07/08/2018   L shoulder Surgery  2011   POLYPECTOMY  03/02/2020    Procedure: POLYPECTOMY;  Surgeon: Bernette Redbird, MD;  Location: [facility] ENDOSCOPY;  Service: Endoscopy;;   SCLEROTHERAPY  11/03/2020   Procedure: Susa Day;  Surgeon: Charlott Rakes, MD;  Location: [facility] ENDOSCOPY;  Service: Endoscopy;;   SUBMUCOSAL INJECTION  09/22/2017   Procedure: SUBMUCOSAL INJECTION;  Surgeon: Vida Rigger, MD;  Location: [facility] ENDOSCOPY;  Service: Endoscopy;;   SUBMUCOSAL INJECTION  12/04/2018   Procedure: SUBMUCOSAL INJECTION;  Surgeon: Charlott Rakes, MD;  Location: [facility] ENDOSCOPY;  Service: Endoscopy;;    Family History  Problem Relation Age of Onset   Cancer Mother        Ovarian   Ovarian cancer Mother    Diabetes Mother    Kidney disease Mother    Bleeding Disorder Mother    Diabetes type II Sister    Bleeding Disorder Sister    Diabetes Sister    Colon cancer Maternal Grandfather    Crohn's disease Maternal Grandfather    Stomach cancer Maternal Grandmother    Kidney disease Son    Bleeding Disorder Son    Dysmenorrhea Neg Hx     Social History   Socioeconomic History   Marital status: Widowed    Spouse name: Not on file   Number of children: 3   Years of education: Not on file   Highest education level: Not on file  Occupational History   Not on file  Tobacco Use   Smoking status: Every Day    Current packs/day: 0.50    Average packs/day: 0.5 packs/day for 30.0 years (15.0 ttl pk-yrs)    Types: Cigarettes   Smokeless tobacco: Former    Types: Snuff    Quit date: 49   Tobacco comments:    1/2 PPD  Vaping Use   Vaping status: Never Used  Substance and Sexual Activity   Alcohol use: No    Alcohol/week: 0.0 standard drinks of alcohol   Drug use: No    Comment: last cocaine-2010   Sexual activity: Yes    Birth control/protection: Surgical    Comment: Hysterectomy  Other Topics Concern   Not on file  Social History Narrative   Not on file   Social Drivers of Health   Financial Resource Strain: Not on file  Food Insecurity: No Food  Insecurity (01/31/2023)   Hunger Vital Sign    Worried About Running Out of Food in the Last Year: Never true    Ran Out of Food in the Last Year: Never true  Recent Concern: Food Insecurity - Food Insecurity Present (11/16/2022)   Hunger Vital Sign    Worried About Running Out of Food in the Last Year: Sometimes true    Ran Out of Food in the Last Year: Sometimes true  Transportation Needs: Unmet Transportation Needs (01/31/2023)   PRAPARE - Administrator, Civil Service (Medical): Yes    Lack of Transportation (Non-Medical): Yes  Physical Activity: Not on file  Stress: Not on file  Social Connections: Not on file  Intimate Partner Violence: Not At Risk (01/31/2023)   Humiliation, Afraid, Rape, and Kick questionnaire    Fear of Current or Ex-Partner: No    Emotionally Abused: No    Physically Abused: No    Sexually Abused: No     Physical Exam   Vitals:   04/13/23 0445 04/13/23 0500  BP: (!) 128/55 122/64  Pulse: 60 (!) 59  Resp: 15 15  Temp:    SpO2: 100% 100%    CONSTITUTIONAL: Well-appearing, NAD NEURO/PSYCH:  Alert and oriented x 3, no focal deficits EYES:  eyes equal and reactive  ENT/NECK:  no LAD, no JVD CARDIO: Regular rate, well-perfused, normal S1 and S2 PULM:  CTAB no wheezing or rhonchi GI/GU:  non-distended, non-tender MSK/SPINE:  No gross deformities, no edema SKIN:  no rash, atraumatic   *Additional and/or pertinent findings included in MDM below  Diagnostic and Interventional Summary    EKG Interpretation Date/Time:  April 13, 2023 at 03:19:16 Ventricular Rate:   59 PR Interval:   189 QRS Duration:   98 QT Interval:   515 QTC Calculation:  511 R Axis:      Text Interpretation: Sinus rhythm       Labs Reviewed  CBC - Abnormal; Notable for the following components:      Result Value   WBC 14.5 (*)    RBC 2.03 (*)    Hemoglobin 5.1 (*)    HCT 18.0 (*)    MCH 25.1 (*)    MCHC 28.3 (*)    RDW 22.2 (*)    All other  components within normal limits  COMPREHENSIVE METABOLIC PANEL - Abnormal; Notable for the following components:   Potassium 3.2 (*)    Chloride 112 (*)    CO2 20 (*)    Glucose, Bld 142 (*)    Creatinine, Ser 1.18 (*)    Calcium 8.6 (*)    Total Protein 5.4 (*)    Albumin 2.7 (*)    GFR, Estimated 52 (*)    All other components within normal limits  PROTIME-INR  TYPE AND SCREEN  PREPARE RBC (CROSSMATCH)    No orders to display    Medications  0.9 %  sodium chloride infusion (Manually program via Guardrails IV Fluids) (has no administration in time range)  acetaminophen (TYLENOL) tablet 1,000 mg (1,000 mg Oral Given 04/13/23 0523)  diphenhydrAMINE (BENADRYL) capsule 25 mg (25 mg Oral Given 04/13/23 0523)     Procedures  /  Critical Care .Critical Care  Performed by: Sabas Sous, MD Authorized by: Sabas Sous, MD   Critical care provider statement:    Critical care time (minutes):  35   Critical care was necessary to treat or prevent imminent or life-threatening deterioration of the following conditions: Symptomatic anemia.   Critical care was time spent personally by me on the following activities:  Development of treatment plan with patient or surrogate, discussions with consultants, evaluation of patient's response to treatment, examination of patient, ordering and review of laboratory studies, ordering and review of radiographic studies, ordering and performing treatments and interventions, pulse oximetry, re-evaluation of patient's condition and review of old charts   ED Course and Medical Decision Making  Initial Impression and Ddx History of recurrent anemia due to hereditary hemorrhagic telangiectasia.  Complaining of nosebleeds recently, symptoms may be due to symptomatic anemia.  Awaiting labs.  Soft blood pressure on my assessment 90s over 60s, will monitor closely.  Past medical/surgical history that increases complexity of ED encounter: Hereditary  hemorrhagic telangiectasia  Interpretation of Diagnostics I personally reviewed the laboratory assessment and my interpretation is as follows: Hemoglobin down to 5.1, looks like her baseline is between 6 and 7    Patient Reassessment and Ultimate Disposition/Management     Given the symptomatic nature of this anemia plan is for transfusion and admission.  Patient management required discussion with the following services or consulting groups:  Hospitalist Service  Complexity of Problems Addressed Acute illness or injury that poses threat of life of bodily function  Additional Data Reviewed and Analyzed Further history obtained from: Prior  labs/imaging results  Additional Factors Impacting ED Encounter Risk Consideration of hospitalization  Elmer Sow. Pilar Plate, MD [facility] Emergency Medicine [facility] [email or web address]  Final Clinical Impressions(s) / ED Diagnoses     ICD-10-CM   1. Symptomatic anemia  D64.9       ED Discharge Orders     None        Discharge Instructions Discussed with and Provided to Patient:   Discharge Instructions   None      Sabas Sous, MD 04/13/23 [phone]

## 2023-04-13 NOTE — ED Notes (Signed)
Patient transported to CT 

## 2023-04-13 NOTE — ED Notes (Signed)
1000 Orthopedic provider Dr. Hulda Humphrey at bedside, applying hematoma block with Orthopedic tech.

## 2023-04-14 ENCOUNTER — Observation Stay (HOSPITAL_COMMUNITY): Payer: Medicaid Other

## 2023-04-14 DIAGNOSIS — K5900 Constipation, unspecified: Secondary | ICD-10-CM | POA: Diagnosis not present

## 2023-04-14 DIAGNOSIS — S82851A Displaced trimalleolar fracture of right lower leg, initial encounter for closed fracture: Secondary | ICD-10-CM | POA: Diagnosis not present

## 2023-04-14 DIAGNOSIS — M25571 Pain in right ankle and joints of right foot: Secondary | ICD-10-CM | POA: Diagnosis not present

## 2023-04-14 DIAGNOSIS — W19XXXA Unspecified fall, initial encounter: Secondary | ICD-10-CM | POA: Diagnosis present

## 2023-04-14 DIAGNOSIS — E119 Type 2 diabetes mellitus without complications: Secondary | ICD-10-CM | POA: Diagnosis not present

## 2023-04-14 DIAGNOSIS — Z452 Encounter for adjustment and management of vascular access device: Secondary | ICD-10-CM | POA: Diagnosis not present

## 2023-04-14 DIAGNOSIS — R059 Cough, unspecified: Secondary | ICD-10-CM | POA: Diagnosis not present

## 2023-04-14 DIAGNOSIS — F32A Depression, unspecified: Secondary | ICD-10-CM | POA: Diagnosis not present

## 2023-04-14 DIAGNOSIS — D62 Acute posthemorrhagic anemia: Secondary | ICD-10-CM | POA: Diagnosis not present

## 2023-04-14 DIAGNOSIS — I780 Hereditary hemorrhagic telangiectasia: Secondary | ICD-10-CM | POA: Diagnosis not present

## 2023-04-14 DIAGNOSIS — F17210 Nicotine dependence, cigarettes, uncomplicated: Secondary | ICD-10-CM | POA: Diagnosis not present

## 2023-04-14 DIAGNOSIS — Z6841 Body Mass Index (BMI) 40.0 and over, adult: Secondary | ICD-10-CM | POA: Diagnosis not present

## 2023-04-14 DIAGNOSIS — E669 Obesity, unspecified: Secondary | ICD-10-CM | POA: Diagnosis not present

## 2023-04-14 DIAGNOSIS — G4700 Insomnia, unspecified: Secondary | ICD-10-CM | POA: Diagnosis not present

## 2023-04-14 DIAGNOSIS — G8929 Other chronic pain: Secondary | ICD-10-CM | POA: Diagnosis not present

## 2023-04-14 DIAGNOSIS — Z7984 Long term (current) use of oral hypoglycemic drugs: Secondary | ICD-10-CM | POA: Diagnosis not present

## 2023-04-14 DIAGNOSIS — R55 Syncope and collapse: Secondary | ICD-10-CM | POA: Diagnosis not present

## 2023-04-14 DIAGNOSIS — W19XXXD Unspecified fall, subsequent encounter: Secondary | ICD-10-CM | POA: Diagnosis not present

## 2023-04-14 DIAGNOSIS — F411 Generalized anxiety disorder: Secondary | ICD-10-CM | POA: Diagnosis not present

## 2023-04-14 DIAGNOSIS — E1151 Type 2 diabetes mellitus with diabetic peripheral angiopathy without gangrene: Secondary | ICD-10-CM | POA: Diagnosis not present

## 2023-04-14 DIAGNOSIS — E66813 Obesity, class 3: Secondary | ICD-10-CM | POA: Diagnosis not present

## 2023-04-14 DIAGNOSIS — E876 Hypokalemia: Secondary | ICD-10-CM | POA: Diagnosis not present

## 2023-04-14 DIAGNOSIS — R9431 Abnormal electrocardiogram [ECG] [EKG]: Secondary | ICD-10-CM | POA: Diagnosis not present

## 2023-04-14 DIAGNOSIS — S82841A Displaced bimalleolar fracture of right lower leg, initial encounter for closed fracture: Secondary | ICD-10-CM | POA: Diagnosis not present

## 2023-04-14 DIAGNOSIS — E1165 Type 2 diabetes mellitus with hyperglycemia: Secondary | ICD-10-CM | POA: Diagnosis not present

## 2023-04-14 DIAGNOSIS — S82891S Other fracture of right lower leg, sequela: Secondary | ICD-10-CM | POA: Diagnosis not present

## 2023-04-14 DIAGNOSIS — S82851D Displaced trimalleolar fracture of right lower leg, subsequent encounter for closed fracture with routine healing: Secondary | ICD-10-CM | POA: Diagnosis not present

## 2023-04-14 DIAGNOSIS — D649 Anemia, unspecified: Secondary | ICD-10-CM | POA: Diagnosis not present

## 2023-04-14 DIAGNOSIS — R011 Cardiac murmur, unspecified: Secondary | ICD-10-CM | POA: Diagnosis not present

## 2023-04-14 DIAGNOSIS — L89322 Pressure ulcer of left buttock, stage 2: Secondary | ICD-10-CM | POA: Diagnosis not present

## 2023-04-14 DIAGNOSIS — Z794 Long term (current) use of insulin: Secondary | ICD-10-CM | POA: Diagnosis not present

## 2023-04-14 DIAGNOSIS — I951 Orthostatic hypotension: Secondary | ICD-10-CM | POA: Diagnosis not present

## 2023-04-14 DIAGNOSIS — D75839 Thrombocytosis, unspecified: Secondary | ICD-10-CM | POA: Diagnosis not present

## 2023-04-14 DIAGNOSIS — S82851K Displaced trimalleolar fracture of right lower leg, subsequent encounter for closed fracture with nonunion: Secondary | ICD-10-CM | POA: Diagnosis not present

## 2023-04-14 DIAGNOSIS — J069 Acute upper respiratory infection, unspecified: Secondary | ICD-10-CM | POA: Diagnosis not present

## 2023-04-14 DIAGNOSIS — S82891D Other fracture of right lower leg, subsequent encounter for closed fracture with routine healing: Secondary | ICD-10-CM | POA: Diagnosis not present

## 2023-04-14 DIAGNOSIS — S82854D Nondisplaced trimalleolar fracture of right lower leg, subsequent encounter for closed fracture with routine healing: Secondary | ICD-10-CM | POA: Diagnosis not present

## 2023-04-14 DIAGNOSIS — S82891A Other fracture of right lower leg, initial encounter for closed fracture: Secondary | ICD-10-CM | POA: Diagnosis not present

## 2023-04-14 DIAGNOSIS — I10 Essential (primary) hypertension: Secondary | ICD-10-CM | POA: Diagnosis not present

## 2023-04-14 DIAGNOSIS — K5903 Drug induced constipation: Secondary | ICD-10-CM | POA: Diagnosis not present

## 2023-04-14 DIAGNOSIS — Z66 Do not resuscitate: Secondary | ICD-10-CM | POA: Diagnosis not present

## 2023-04-14 DIAGNOSIS — E785 Hyperlipidemia, unspecified: Secondary | ICD-10-CM | POA: Diagnosis not present

## 2023-04-14 DIAGNOSIS — S82851S Displaced trimalleolar fracture of right lower leg, sequela: Secondary | ICD-10-CM | POA: Diagnosis not present

## 2023-04-14 DIAGNOSIS — D509 Iron deficiency anemia, unspecified: Secondary | ICD-10-CM | POA: Diagnosis not present

## 2023-04-14 DIAGNOSIS — N179 Acute kidney failure, unspecified: Secondary | ICD-10-CM | POA: Diagnosis not present

## 2023-04-14 DIAGNOSIS — D500 Iron deficiency anemia secondary to blood loss (chronic): Secondary | ICD-10-CM | POA: Diagnosis not present

## 2023-04-14 DIAGNOSIS — G8918 Other acute postprocedural pain: Secondary | ICD-10-CM | POA: Diagnosis not present

## 2023-04-14 DIAGNOSIS — R001 Bradycardia, unspecified: Secondary | ICD-10-CM | POA: Diagnosis not present

## 2023-04-14 DIAGNOSIS — D696 Thrombocytopenia, unspecified: Secondary | ICD-10-CM | POA: Diagnosis not present

## 2023-04-14 LAB — ECHOCARDIOGRAM COMPLETE
AR max vel: 1.83 cm2
AV Area VTI: 2.08 cm2
AV Area mean vel: 1.94 cm2
AV Mean grad: 12 mm[Hg]
AV Peak grad: 25.9 mm[Hg]
Ao pk vel: 2.55 m/s
Area-P 1/2: 2.75 cm2
Calc EF: 67.7 %
Height: 63 in
S' Lateral: 3.1 cm
Single Plane A2C EF: 67.4 %
Single Plane A4C EF: 67.6 %
Weight: 3671.98 [oz_av]

## 2023-04-14 LAB — BASIC METABOLIC PANEL
Anion gap: 6 (ref 5–15)
BUN: 8 mg/dL (ref 8–23)
CO2: 23 mmol/L (ref 22–32)
Calcium: 8.6 mg/dL — ABNORMAL LOW (ref 8.9–10.3)
Chloride: 113 mmol/L — ABNORMAL HIGH (ref 98–111)
Creatinine, Ser: 0.95 mg/dL (ref 0.44–1.00)
GFR, Estimated: >60 mL/min (ref >60)
Glucose, Bld: 96 mg/dL (ref 70–99)
Potassium: 4 mmol/L (ref 3.5–5.1)
Sodium: 142 mmol/L (ref 135–145)

## 2023-04-14 LAB — CBC
HCT: 22.6 % — ABNORMAL LOW (ref 36.0–46.0)
Hemoglobin: 7 g/dL — ABNORMAL LOW (ref 12.0–15.0)
MCH: 26.5 pg (ref 26.0–34.0)
MCHC: 31 g/dL (ref 30.0–36.0)
MCV: 85.6 fL (ref 80.0–100.0)
Platelets: 157 10*3/uL (ref 150–400)
RBC: 2.64 MIL/uL — ABNORMAL LOW (ref 3.87–5.11)
RDW: 19.5 % — ABNORMAL HIGH (ref 11.5–15.5)
WBC: 11.8 10*3/uL — ABNORMAL HIGH (ref 4.0–10.5)
nRBC: 0 % (ref 0.0–0.2)

## 2023-04-14 LAB — HEMOGLOBIN AND HEMATOCRIT, BLOOD
HCT: 24.8 % — ABNORMAL LOW (ref 36.0–46.0)
Hemoglobin: 7.7 g/dL — ABNORMAL LOW (ref 12.0–15.0)

## 2023-04-14 LAB — GLUCOSE, CAPILLARY
Glucose-Capillary: 98 mg/dL (ref 70–99)
Glucose-Capillary: 108 mg/dL — ABNORMAL HIGH (ref 70–99)
Glucose-Capillary: 131 mg/dL — ABNORMAL HIGH (ref 70–99)
Glucose-Capillary: 145 mg/dL — ABNORMAL HIGH (ref 70–99)

## 2023-04-14 LAB — PREPARE RBC (CROSSMATCH)

## 2023-04-14 MED ORDER — PERFLUTREN LIPID MICROSPHERE
1.0000 mL | INTRAVENOUS | Status: AC | PRN
  Administered 2023-04-14: 1 mL via INTRAVENOUS
  Filled 2023-04-14: qty 10

## 2023-04-14 MED ORDER — SODIUM CHLORIDE 0.9% IV SOLUTION: Freq: Once | INTRAVENOUS | Status: AC

## 2023-04-14 MED ORDER — SENNA 8.6 MG PO TABS
1.0000 | ORAL_TABLET | Freq: Every day | ORAL | Status: DC
  Administered 2023-04-14 – 2023-04-15 (×2): 8.6 mg via ORAL
  Filled 2023-04-14 (×2): qty 1

## 2023-04-14 NOTE — Progress Notes (Signed)
OT Cancellation Note  Patient Details Name: Peggy House MRN: [id number] DOB: 1961/01/20   Cancelled Treatment:    Reason Eval/Treat Not Completed: Other (comment) Discussed with RN; unable to initiate OT eval d/t blood transfusion and pain medication administration needed. Will follow up again as schedule permits. Lorre Munroe 04/14/2023, 2:29 PM

## 2023-04-14 NOTE — Plan of Care (Signed)

## 2023-04-15 ENCOUNTER — Inpatient Hospital Stay (HOSPITAL_COMMUNITY): Payer: Medicaid Other

## 2023-04-15 DIAGNOSIS — R059 Cough, unspecified: Secondary | ICD-10-CM | POA: Diagnosis not present

## 2023-04-15 DIAGNOSIS — Z452 Encounter for adjustment and management of vascular access device: Secondary | ICD-10-CM | POA: Diagnosis not present

## 2023-04-15 DIAGNOSIS — D696 Thrombocytopenia, unspecified: Secondary | ICD-10-CM | POA: Diagnosis not present

## 2023-04-15 LAB — BASIC METABOLIC PANEL
Anion gap: 7 (ref 5–15)
BUN: 8 mg/dL (ref 8–23)
CO2: 24 mmol/L (ref 22–32)
Calcium: 8.5 mg/dL — ABNORMAL LOW (ref 8.9–10.3)
Chloride: 108 mmol/L (ref 98–111)
Creatinine, Ser: 0.86 mg/dL (ref 0.44–1.00)
GFR, Estimated: >60 mL/min (ref >60)
Glucose, Bld: 87 mg/dL (ref 70–99)
Potassium: 3.7 mmol/L (ref 3.5–5.1)
Sodium: 139 mmol/L (ref 135–145)

## 2023-04-15 LAB — RESP PANEL BY RT-PCR (RSV, FLU A&B, COVID)  RVPGX2
Influenza A by PCR: NEGATIVE
Influenza B by PCR: NEGATIVE
Resp Syncytial Virus by PCR: NEGATIVE
SARS Coronavirus 2 by RT PCR: NEGATIVE

## 2023-04-15 LAB — GLUCOSE, CAPILLARY
Glucose-Capillary: 94 mg/dL (ref 70–99)
Glucose-Capillary: 110 mg/dL — ABNORMAL HIGH (ref 70–99)
Glucose-Capillary: 125 mg/dL — ABNORMAL HIGH (ref 70–99)
Glucose-Capillary: 107 mg/dL — ABNORMAL HIGH (ref 70–99)

## 2023-04-15 LAB — CBC
HCT: 25.2 % — ABNORMAL LOW (ref 36.0–46.0)
Hemoglobin: 7.8 g/dL — ABNORMAL LOW (ref 12.0–15.0)
MCH: 26.3 pg (ref 26.0–34.0)
MCHC: 31 g/dL (ref 30.0–36.0)
MCV: 84.8 fL (ref 80.0–100.0)
Platelets: 140 10*3/uL — ABNORMAL LOW (ref 150–400)
RBC: 2.97 MIL/uL — ABNORMAL LOW (ref 3.87–5.11)
RDW: 19.3 % — ABNORMAL HIGH (ref 11.5–15.5)
WBC: 10.9 10*3/uL — ABNORMAL HIGH (ref 4.0–10.5)
nRBC: 0 % (ref 0.0–0.2)

## 2023-04-15 LAB — PREPARE RBC (CROSSMATCH)

## 2023-04-15 MED ORDER — AMINOCAPROIC ACID 500 MG PO TABS
1.0000 g | ORAL_TABLET | Freq: Two times a day (BID) | ORAL | Status: DC
  Administered 2023-04-15 – 2023-05-02 (×32): 1 g via ORAL
  Filled 2023-04-15 (×37): qty 2

## 2023-04-15 MED ORDER — SODIUM CHLORIDE 0.9% IV SOLUTION: Freq: Once | INTRAVENOUS | Status: AC

## 2023-04-15 MED ORDER — OXYCODONE HCL 5 MG PO TABS
10.0000 mg | ORAL_TABLET | Freq: Four times a day (QID) | ORAL | Status: DC
Start: 2023-04-15 — End: 2023-04-19
  Administered 2023-04-15 – 2023-04-19 (×17): 10 mg via ORAL
  Filled 2023-04-15 (×17): qty 2

## 2023-04-15 MED ORDER — GUAIFENESIN ER 600 MG PO TB12
600.0000 mg | ORAL_TABLET | Freq: Two times a day (BID) | ORAL | Status: DC
  Administered 2023-04-15 – 2023-04-16 (×3): 600 mg via ORAL
  Filled 2023-04-15 (×3): qty 1

## 2023-04-15 MED ORDER — POLYETHYLENE GLYCOL 3350 17 G PO PACK
17.0000 g | PACK | Freq: Every day | ORAL | Status: DC
  Administered 2023-04-15 – 2023-04-16 (×2): 17 g via ORAL
  Filled 2023-04-15 (×2): qty 1

## 2023-04-15 MED ORDER — SENNA 8.6 MG PO TABS
2.0000 | ORAL_TABLET | Freq: Every day | ORAL | Status: DC
  Administered 2023-04-16: 17.2 mg via ORAL
  Filled 2023-04-15: qty 2

## 2023-04-15 NOTE — Progress Notes (Signed)
.  Subjective: Patient seen and examined. Stating she has continued significant pain in ankle. Also complaining of some URI symptoms  Activity level:  Non weight bearing RLE Diet tolerance:  as tolerated Voiding:  as tolerated Patient reports pain as severe.    Objective: Vital signs in last 24 hours: Temp:  [98.5 F (36.9 C)-99.6 F (37.6 C)] 99.6 F (37.6 C) (12/31 1459) Pulse Rate:  [66-78] 70 (12/31 1533) Resp:  [17-18] 18 (12/31 1533) BP: (121-151)/(52-70) 125/60 (12/31 1533) SpO2:  [90 %-98 %] 95 % (12/31 1533)  Labs: Recent Labs    04/13/23 0327 04/13/23 1514 04/14/23 0644 04/14/23 1540 04/15/23 0558  HGB 5.1* 7.3* 7.0* 7.7* 7.8*   Recent Labs    04/14/23 0644 04/14/23 1540 04/15/23 0558  WBC 11.8*  --  10.9*  RBC 2.64*  --  2.97*  HCT 22.6* 24.8* 25.2*  PLT 157  --  140*   Recent Labs    04/14/23 0644 04/15/23 0558  NA 142 139  K 4.0 3.7  CL 113* 108  CO2 23 24  BUN 8 8  CREATININE 0.95 0.86  GLUCOSE 96 87  CALCIUM  8.6* 8.5*   Recent Labs    04/13/23 0327  INR 1.2    Physical Exam:  Splint in place, CDI Skin under splint examined, significant swelling and ecchymosis Motor intact EHL/FHL Sensation diminished in toes but able to feel Cap refill <2  Assessment/Plan: 62 year old female with a right ankle trimalleolar fracture dislocation s/p closed reduction. Will require surgial fixation but given current swelling can not proceed safely with surgery    NWB RLE Maintain splint, splint loosed slightly today to try and help pain Aggressive ice and elevation of extremity Continue PT/OT Pain control per primary Most recent Hgb 7.8, recommend maintaining >7 for surgery DVT ppx per primary Anticipate surgery possible later this week or early next week  .Adine JAYSON Mon 04/15/2023, 3:58 PM Orthopaedic Surgery     Adine JAYSON Mon 04/15/2023, 3:52 PM

## 2023-04-15 NOTE — Progress Notes (Signed)
 HD#1 SUBJECTIVE:  Patient Summary:  Peggy House is a 62 y.o. with a pertinent PMH of hereditary hemorrhagic telangiectasia, chronic anemia, hypertension, hyperlipidemia, GI bleed, and type 2 diabetes , who presented with syncopal episode and admitted for symptomatic anemia and R ankle fracture.   Overnight Events: None.    Interim History: Patient does report some increased pain of her right leg as well as new viral leg symptoms that started overnight.  Regarding the pain she describes burning pain in the right leg that is localized to the area, transplant is placed.  Regarding her cough she has a cough that started overnight, she denies fevers, she does report dyspnea (90+% on ra).  OBJECTIVE:  Vital Signs: Vitals:   04/15/23 0425 04/15/23 0717 04/15/23 1212 04/15/23 1403  BP: 138/70 (!) 121/58 (!) 129/52 134/69  Pulse: 67 66 68 72  Resp: 18 17 18 18   Temp: 98.5 F (36.9 C)  99.3 F (37.4 C) 99.5 F (37.5 C)  TempSrc: Oral   Oral  SpO2: 97% 90% 96% 96%  Weight:      Height:       Supplemental O2: Room Air SpO2: 96 %  Filed Weights   04/13/23 0313  Weight: 104.1 kg     Intake/Output Summary (Last 24 hours) at 04/15/2023 1443 Last data filed at 04/15/2023 0400 Gross per 24 hour  Intake 200 ml  Output 200 ml  Net 0 ml   Net IO Since Admission: 910.17 mL [04/15/23 1443]  Physical Exam: Physical Exam Constitutional:      Appearance: She is not ill-appearing.  Cardiovascular:     Rate and Rhythm: Normal rate.  Pulmonary:     Effort: Pulmonary effort is normal. Mild bibasilar rales. Abdominal:     General: Abdomen is flat.  Musculoskeletal:     Comments: R ankle in splint. The splint is intact and snug. R leg/foot is neurovascularly inact. Toes are warm and able to move. She has some trouble localizing sensation to the toes. Skin:    General: Skin is warm.     Capillary Refill: Capillary refill takes less than 2 seconds.  Neurological:     Mental  Status: She is alert.  Psychiatric:        Mood and Affect: Mood normal.        Behavior: Behavior normal.   Patient Lines/Drains/Airways Status     Active Line/Drains/Airways     Name Placement date Placement time Site Days   Implanted Port 07/08/18 Right Chest 07/08/18  --  Chest  1742   Peripheral IV 04/13/23 18 G Left Antecubital 04/13/23  0319  Antecubital  2             ASSESSMENT/PLAN:  Assessment: Active Problems:   Acute on chronic blood loss anemia   Anxiety   Essential hypertension   Obesity, Class III, BMI 40-49.9 (morbid obesity) (HCC)   Recurrent epistaxis   Type 2 diabetes mellitus with vascular disease (HCC)   Syncope   HHT (hereditary hemorrhagic telangiectasia) (HCC)   Tobacco use disorder   Closed right trimalleolar fracture  Peggy House is a 62 y.o. person living with a history of hereditary hemorrhagic telangiectasia, chronic iron  deficiency anemia, type 2 diabetes, tobacco use disorder, GAD who presented following a syncopal episode and admitted for symptomatic anemia.   Plan: #Symptomatic anemia #Hereditary hemorrhagic telangiectasia Stable but difficult to keep Hg high given propensity for bleeds. Admitting Hg 5.1, now s/p 3u PRBCs Hg 7.7. Will  transfuse again, per OP hematology notes goal is above 8. Patient with known history of multiple bleeding events, including history of GI bleeding, but denies any symptoms of GI bleeding.  Bleeding primarily seems to have been secondary to epistaxis. - Monitor Hg, attempt to keep above 8. Monitor for signs of bleeding. For recurrent nosebleeds, attempt control with cold packs and pressure. If persistent, consider ENT vs silver  nitrate.   URI, subjective dyspnea No hypoxic.  Report subjective dyspnea and a new cough.  Lung exam does reveal some rales appear to be new.  Will assess with a chest x-ray.  Will treat with Mucinex .  #Syncope #Systolic Cardiac murmur Syncopal episode unclear but likely  secondary to orthostatic hypotension/anemia.  Per concern of aortic stenosis and murmer, Echo obtained and negative for valvular disease (there was grade 2 diastolic dysfunction). Would expect tachycardia with symptomatic anemia, however was actually bradycardic on presentation. - Echo without valvular disease. Grade 2 diastolic dysfunction. - Stop telemetry - CT head without acute process - Holding home BP regiment for now, will monitor BPs   Trimalleolar R Ankle Fracture Ankle fracture due to fall from syncopy. Orthopedics involved. External reduction Sunday. She does have burning pain of the R leg that is new, might be sequela of splint. Plan for surgery Thursday vs Friday when swelling has reduced and Hg above 7. No weight bearing. - Orthopedics following, thank you - Scheduled Tylenol  1 g 3 times daily, oxycodone  10mg  q6h, dilaudid  0.5mg  q3 prn. For bowel regimen, miralax  prn. - Voltaren  gel as needed   Acute kidney injury (resolved) Creatinine improved to 0.86, was as high as 1.18, baseline around 0.80. Likely prerenal. Intravascular repletion via blood per above. - Daily BMP   Type 2 diabetes Last A1c 02/01/2023, 5.0.  She seems to be well-controlled for many years.  Plan: - Hold metformin  in setting of AKI - CBG   Tobacco use disorder Half pack a day smoker. Plan: - Will hold nicotine  patch in setting of possible surgery -No PFTs on file to formally diagnose COPD, resume home albuterol    GAD Resume PTA patient monitored trazodone , Remeron , Celexa   Best Practice: Diet: Cardiac diet IVF: None VTE: SCDs Start: 04/13/23 0620 Code: DNR/DNI AB: None DISPO: Anticipated discharge in 4 days to home vs SNF pending Surgery .  Signature: Lonni Africa, D.O.  Internal Medicine Resident, PGY-1 [name]  Pager: [phone] 2:43 PM, 04/15/2023   Please contact the on call pager after 5 pm and on weekends at [phone].

## 2023-04-15 NOTE — Evaluation (Addendum)
 Occupational Therapy Evaluation Patient Details Name: Peggy House MRN: [id number] DOB: 1961-04-14 Today's Date: 04/15/2023   History of Present Illness 62 y.o. female presented with syncopal episode and admitted for symptomatic anemia and R ankle fracture, external reduction performed 12/29, surgery planned in a few days  PMH includes: hereditary Hemorrhagic Telangiectasia, blood loss anemia, MDD, GAD, arthritis, HTN, obesity, DM2.   Clinical Impression   Pt c/o significant pain, 8/10 to R ankle. Pt lives with son who works 3pm-3am, has other family who could assist occasionally as well. Pt PLOF mod I, sometimes with RW, help with IADLs, sponge bathing at baseline. Pt currently NWB to RLE, significantly limits ability to complete transfers, bed mobility, dressing/bathing, max to total for all LB ADLs and mobility. Surgery planned soon, will reassess after surgery. Current DC recommendation postacute rehab <3hrs/day to improve to functional level with RW as Pt not able to fit w/c around home. Pt will be seen acutely to progress as able.        If plan is discharge home, recommend the following: Two people to help with walking and/or transfers;A lot of help with bathing/dressing/bathroom;Assistance with cooking/housework;Assist for transportation;Help with stairs or ramp for entrance    Functional Status Assessment  Patient has had a recent decline in their functional status and demonstrates the ability to make significant improvements in function in a reasonable and predictable amount of time.  Equipment Recommendations  Other (comment) (defer)    Recommendations for Other Services       Precautions / Restrictions Precautions Precautions: Fall Restrictions Weight Bearing Restrictions Per Provider Order: Yes RLE Weight Bearing Per Provider Order: Non weight bearing      Mobility Bed Mobility Overal bed mobility: Needs Assistance Bed Mobility: Supine to Sit, Sit to  Supine     Supine to sit: Max assist, HOB elevated, Used rails Sit to supine: Max assist, HOB elevated, Used rails   General bed mobility comments: max A and max effort, increased time.    Transfers Overall transfer level: Needs assistance Equipment used: Rolling walker (2 wheels) Transfers: Sit to/from Stand Sit to Stand: Mod assist, From elevated surface           General transfer comment: mod A for STS, not able to pivot at this time. May not be able to tolerate Stedy      Balance Overall balance assessment: Needs assistance Sitting-balance support: No upper extremity supported, Feet supported Sitting balance-Leahy Scale: Fair Sitting balance - Comments: able to perform ADLs sitting EOB   Standing balance support: During functional activity, Reliant on assistive device for balance, Bilateral upper extremity supported Standing balance-Leahy Scale: Poor Standing balance comment: reliant on RW, NWB RLE limits balance.                           ADL either performed or assessed with clinical judgement   ADL Overall ADL's : Needs assistance/impaired Eating/Feeding: Independent   Grooming: Set up;Sitting   Upper Body Bathing: Minimal assistance;Sitting   Lower Body Bathing: Maximal assistance;Sitting/lateral leans   Upper Body Dressing : Minimal assistance;Sitting   Lower Body Dressing: Maximal assistance;Sitting/lateral leans   Toilet Transfer: Total assistance   Toileting- Clothing Manipulation and Hygiene: Maximal assistance;Bed level         General ADL Comments: Pt has significant difficulty getting to EOB, able to stand but I do not think she will be able to pivot to [facility] with RLE NWB without mechanical assistance,  may not be able to tolerate Stedy. bed level currently for toileting/hygiene.     Vision Baseline Vision/History: 1 Wears glasses;4 Cataracts Ability to See in Adequate Light: 2 Moderately impaired Patient Visual Report: Blurring of  vision       Perception         Praxis         Pertinent Vitals/Pain Pain Assessment Pain Assessment: 0-10 Pain Score: 8  Pain Location: R ankle Pain Descriptors / Indicators: Aching, Grimacing Pain Intervention(s): Monitored during session     Extremity/Trunk Assessment Upper Extremity Assessment Upper Extremity Assessment: LUE deficits/detail;Generalized weakness LUE Deficits / Details: hx of carpal tunnel surgery 2 years ago, still numb in palm, drops items often. Pt has difficulty with FM/opposition LUE Sensation: decreased light touch LUE Coordination: decreased fine motor           Communication Communication Communication: No apparent difficulties   Cognition Arousal: Alert Behavior During Therapy: WFL for tasks assessed/performed Overall Cognitive Status: Within Functional Limits for tasks assessed                                       General Comments       Exercises     Shoulder Instructions      Home Living Family/patient expects to be discharged to:: Private residence Living Arrangements: Children Available Help at Discharge: Family;Available PRN/intermittently Type of Home: Apartment Home Access: Level entry     Home Layout: One level     Bathroom Shower/Tub: Sponge bathes at baseline;Tub/shower unit   Bathroom Toilet: Standard (BSC on top)     Home Equipment: [employer] (2 wheels);BSC/3in1   Additional Comments: Pt lives with son who works 3pm-3am      Prior Functioning/Environment Prior Level of Function : Needs assist             Mobility Comments: uses RW sometimes, reports 1-2 falls in the last year ADLs Comments: needs help with groceries, meal prep, socks/shoes, sitting/standing.        OT Problem List: Decreased strength;Decreased range of motion;Decreased activity tolerance;Impaired balance (sitting and/or standing);Decreased safety awareness;Pain      OT Treatment/Interventions:  Self-care/ADL training;Therapeutic exercise;Energy conservation;DME and/or AE instruction;Manual therapy;Therapeutic activities;Patient/family education;Balance training    OT Goals(Current goals can be found in the care plan section) Acute Rehab OT Goals Patient Stated Goal: to manage pain OT Goal Formulation: With patient Time For Goal Achievement: 04/29/23 Potential to Achieve Goals: Good  OT Frequency: Min 1X/week    Co-evaluation              AM-PAC OT 6 Clicks Daily Activity     Outcome Measure Help from another person eating meals?: None Help from another person taking care of personal grooming?: A Little Help from another person toileting, which includes using toliet, bedpan, or urinal?: A Lot Help from another person bathing (including washing, rinsing, drying)?: A Lot Help from another person to put on and taking off regular upper body clothing?: A Little Help from another person to put on and taking off regular lower body clothing?: A Lot 6 Click Score: 16   End of Session Equipment Utilized During Treatment: Gait belt;Rolling walker (2 wheels) Nurse Communication: Mobility status  Activity Tolerance: Patient tolerated treatment well Patient left: in bed;with call bell/phone within reach;with bed alarm set  OT Visit Diagnosis: Unsteadiness on feet (R26.81);Other abnormalities of gait and mobility (R26.89);Muscle  weakness (generalized) (M62.81);Pain Pain - Right/Left: Right Pain - part of body: Ankle and joints of foot                Time: 8945-8865 OT Time Calculation (min): 40 min Charges:  OT General Charges $OT Visit: 1 Visit OT Evaluation $OT Eval Moderate Complexity: 1 Mod OT Treatments $Self Care/Home Management : 23-37 mins  [address], OTR/L   [name] 04/15/2023, 11:45 AM

## 2023-04-15 NOTE — Progress Notes (Signed)
 Transition of Care [facility]) - Inpatient Brief Assessment   Patient Details  Name: Peggy House MRN: [id number] Date of Birth: 01/13/1961  [facility] CM/SW Contact:    Rosaline JONELLE Joe, RN Phone Number: 04/15/2023, 2:23 PM   Clinical Narrative: Patient admitted to the hospital with anemia and ankle fracture.  Patient lives at home with her son prior to hospitalization.  TOC Team will continue to follow the patient for [facility] needs as patient progresses.   Transition of Care Asessment: Insurance and Status: (P) Insurance coverage has been reviewed Patient has primary care physician: (P) Yes Home environment has been reviewed: (P) from home with son Prior level of function:: (P) Independent Prior/Current Home Services: (P) No current home services Social Drivers of Health Review: (P) SDOH reviewed needs interventions Readmission risk has been reviewed: (P) Yes Transition of care needs: (P) transition of care needs identified, TOC will continue to follow

## 2023-04-16 LAB — CBC
HCT: 27.9 % — ABNORMAL LOW (ref 36.0–46.0)
Hemoglobin: 8.6 g/dL — ABNORMAL LOW (ref 12.0–15.0)
MCH: 26.5 pg (ref 26.0–34.0)
MCHC: 30.8 g/dL (ref 30.0–36.0)
MCV: 85.8 fL (ref 80.0–100.0)
Platelets: 161 10*3/uL (ref 150–400)
RBC: 3.25 MIL/uL — ABNORMAL LOW (ref 3.87–5.11)
RDW: 18.4 % — ABNORMAL HIGH (ref 11.5–15.5)
WBC: 10.4 10*3/uL (ref 4.0–10.5)
nRBC: 0 % (ref 0.0–0.2)

## 2023-04-16 LAB — BASIC METABOLIC PANEL
Anion gap: 6 (ref 5–15)
BUN: 10 mg/dL (ref 8–23)
CO2: 26 mmol/L (ref 22–32)
Calcium: 8.7 mg/dL — ABNORMAL LOW (ref 8.9–10.3)
Chloride: 108 mmol/L (ref 98–111)
Creatinine, Ser: 0.86 mg/dL (ref 0.44–1.00)
GFR, Estimated: >60 mL/min (ref >60)
Glucose, Bld: 107 mg/dL — ABNORMAL HIGH (ref 70–99)
Potassium: 4 mmol/L (ref 3.5–5.1)
Sodium: 140 mmol/L (ref 135–145)

## 2023-04-16 LAB — GLUCOSE, CAPILLARY
Glucose-Capillary: 99 mg/dL (ref 70–99)
Glucose-Capillary: 137 mg/dL — ABNORMAL HIGH (ref 70–99)
Glucose-Capillary: 92 mg/dL (ref 70–99)
Glucose-Capillary: 120 mg/dL — ABNORMAL HIGH (ref 70–99)

## 2023-04-16 MED ORDER — POLYETHYLENE GLYCOL 3350 17 G PO PACK
17.0000 g | PACK | Freq: Two times a day (BID) | ORAL | Status: DC
  Administered 2023-04-16 – 2023-05-01 (×16): 17 g via ORAL
  Filled 2023-04-16 (×29): qty 1

## 2023-04-16 MED ORDER — SENNOSIDES-DOCUSATE SODIUM 8.6-50 MG PO TABS
1.0000 | ORAL_TABLET | Freq: Two times a day (BID) | ORAL | Status: DC
  Administered 2023-04-16 – 2023-04-30 (×17): 1 via ORAL
  Filled 2023-04-16 (×31): qty 1

## 2023-04-16 MED ORDER — TRIMETHOBENZAMIDE HCL 100 MG/ML IM SOLN
200.0000 mg | Freq: Three times a day (TID) | INTRAMUSCULAR | Status: DC | PRN
  Administered 2023-04-28 – 2023-05-02 (×2): 200 mg via INTRAMUSCULAR
  Filled 2023-04-16 (×4): qty 2

## 2023-04-16 MED ORDER — GUAIFENESIN ER 600 MG PO TB12
1200.0000 mg | ORAL_TABLET | Freq: Two times a day (BID) | ORAL | Status: DC
  Administered 2023-04-16 – 2023-04-18 (×4): 1200 mg via ORAL
  Filled 2023-04-16 (×4): qty 2

## 2023-04-16 MED ORDER — ENSURE ENLIVE PO LIQD
237.0000 mL | Freq: Two times a day (BID) | ORAL | Status: DC
Start: 2023-04-16 — End: 2023-04-28
  Administered 2023-04-16 – 2023-04-28 (×12): 237 mL via ORAL

## 2023-04-16 MED ORDER — HYDROMORPHONE HCL 1 MG/ML IJ SOLN
1.0000 mg | INTRAMUSCULAR | Status: DC | PRN
  Administered 2023-04-16 – 2023-04-19 (×8): 1 mg via INTRAVENOUS
  Filled 2023-04-16 (×8): qty 1

## 2023-04-16 NOTE — Plan of Care (Signed)

## 2023-04-16 NOTE — Progress Notes (Addendum)
 HD#2 SUBJECTIVE:  Patient Summary: Peggy House is a 63 y.o. with a pertinent PMH of hereditary hemorrhagic telangiectasia, chronic anemia, hypertension, hyperlipidemia, GI bleed, and type 2 diabetes , who presented with syncopal episode and admitted for symptomatic anemia and R ankle fracture.    Overnight Events: None.     Interim History: States R leg fx pain is 8/10. Burning pain at leg is relieved with loosened splint. Continues to have URI symptoms. Has not had bowel movement in 6 days. No abdominal pain. Some nausea with eating that she attributes to her amicar  and has happened before.  OBJECTIVE:  Vital Signs: Vitals:   04/16/23 0741 04/16/23 1013 04/16/23 1100 04/16/23 1331  BP: (!) 153/72     Pulse: 61     Resp: 18 16 18 16   Temp: 98.1 F (36.7 C)     TempSrc:      SpO2: 97%     Weight:      Height:       Supplemental O2: Room Air SpO2: 97 %  Filed Weights   04/13/23 0313  Weight: 104.1 kg     Intake/Output Summary (Last 24 hours) at 04/16/2023 1413 Last data filed at 04/16/2023 9366 Gross per 24 hour  Intake 600 ml  Output 900 ml  Net -300 ml   Net IO Since Admission: 610.17 mL [04/16/23 1413]  Physical Exam: Physical Exam Constitutional:      Appearance: She is not ill-appearing.  Cardiovascular:     Rate and Rhythm: Normal rate.  Pulmonary:     Effort: Pulmonary effort is normal. CTABL. Abdominal:     General: Abdomen is flat.  Musculoskeletal:     Comments: R ankle in splint. Swelling beneath bandages appears reduced. The splint is intact and snug. R leg/foot is neurovascularly inact. Toes are warm and able to move. She has some trouble localizing sensation to the toes. Skin:    General: Skin is warm.     Capillary Refill: Capillary refill takes less than 2 seconds.  Neurological:     Mental Status: She is alert.  Psychiatric:        Mood and Affect: Mood normal.        Behavior: Behavior normal.   Patient Lines/Drains/Airways  Status     Active Line/Drains/Airways     Name Placement date Placement time Site Days   Implanted Port 07/08/18 Right Chest 07/08/18  --  Chest  1743   Peripheral IV 04/15/23 20 G Anterior;Right Wrist 04/15/23  1749  Wrist  1             ASSESSMENT/PLAN:  Assessment: Principal Problem:   Closed right trimalleolar fracture Active Problems:   Acute on chronic blood loss anemia   Anxiety   Essential hypertension   Obesity, Class III, BMI 40-49.9 (morbid obesity) (HCC)   Recurrent epistaxis   Type 2 diabetes mellitus with vascular disease (HCC)   Syncope   HHT (hereditary hemorrhagic telangiectasia) (HCC)   Tobacco use disorder   Thrombocytopenia (HCC)  Peggy House is a 63 y.o. person living with a history of hereditary hemorrhagic telangiectasia, chronic iron  deficiency anemia, type 2 diabetes, tobacco use disorder, GAD who presented following a syncopal episode and admitted for symptomatic anemia.   Plan: Trimalleolar R Ankle Fracture Ankle fracture due to fall/syncopy. Orthopedics involved. External reduction Sunday 12/29. Plan for surgery late this week vs early next week depending on swelling. Need Hg above 7. No weight bearing. - Orthopedics following, thank  you - Scheduled Tylenol  1 g 3 times daily, oxycodone  10mg  q6h, dilaudid  0.5mg  q3 prn. For bowel regimen, miralax  prn. - Elevate leg, ice packs  Symptomatic anemia Hereditary hemorrhagic telangiectasia Goal to keep Hg above 8, at goal, s/p 4u PRBCs.  Given HHT, history of multiple bleeding events, including GI, but denies any now.  Bleeding primarily seems to have been secondary to epistaxis. - Monitor Hg, attempt to keep above 8. Monitor for signs of bleeding. For recurrent nosebleeds, attempt control with cold packs and pressure. If persistent, consider ENT vs silver  nitrate.   Syncope due to acute on chronic blood loss anemia  - Echo without valvular disease. Grade 2 diastolic dysfunction. - Stop  telemetry - CT head without acute process - Holding home BP regimen for now, will monitor BPs   URI, Dyspnea Not hypoxic.  Report subjective dyspnea and a new cough.  Lung exam stable.  CXR negative.  Will treat with Mucinex .   Acute kidney injury (resolved) Creatinine improved to 0.86, was as high as 1.18, baseline around 0.80. Likely prerenal. Intravascular repletion via blood per above. - Daily BMP   Type 2 diabetes Last A1c 02/01/2023, 5.0.  She seems to be well-controlled for many years.  Plan: - Hold metformin  in setting of AKI - CBG   Tobacco use disorder Half House a day smoker. Plan: - Will hold nicotine  patch in setting of possible surgery -No PFTs on file to formally diagnose COPD, resume home albuterol    GAD Resume PTA patient monitored trazodone , Remeron , Celexa    Best Practice: Diet: Cardiac diet IVF: None VTE: SCDs Start: 04/13/23 0620 Code: DNR/DNI AB: None DISPO: Anticipated discharge in 6 days to home vs SNF pending Surgery.  Signature: Lonni Africa, D.O.  Internal Medicine Resident, PGY-1 [facility]  Pager: [phone] 2:13 PM, 04/16/2023   Please contact the on call pager after 5 pm and on weekends at [phone].

## 2023-04-16 NOTE — Progress Notes (Signed)
 Patient had an acute episode of Epitaxis. Bleeding was mild, cold wash cloth was applied over the nose for a few minutes and was effective. Patient is now resting. Will continue to monitor.

## 2023-04-17 DIAGNOSIS — I780 Hereditary hemorrhagic telangiectasia: Secondary | ICD-10-CM | POA: Diagnosis not present

## 2023-04-17 DIAGNOSIS — S82851A Displaced trimalleolar fracture of right lower leg, initial encounter for closed fracture: Secondary | ICD-10-CM

## 2023-04-17 DIAGNOSIS — S82851D Displaced trimalleolar fracture of right lower leg, subsequent encounter for closed fracture with routine healing: Secondary | ICD-10-CM | POA: Diagnosis not present

## 2023-04-17 DIAGNOSIS — D649 Anemia, unspecified: Secondary | ICD-10-CM | POA: Diagnosis not present

## 2023-04-17 LAB — CBC
HCT: 27.1 % — ABNORMAL LOW (ref 36.0–46.0)
Hemoglobin: 8.3 g/dL — ABNORMAL LOW (ref 12.0–15.0)
MCH: 26.9 pg (ref 26.0–34.0)
MCHC: 30.6 g/dL (ref 30.0–36.0)
MCV: 88 fL (ref 80.0–100.0)
Platelets: 168 10*3/uL (ref 150–400)
RBC: 3.08 MIL/uL — ABNORMAL LOW (ref 3.87–5.11)
RDW: 18.3 % — ABNORMAL HIGH (ref 11.5–15.5)
WBC: 10.2 10*3/uL (ref 4.0–10.5)
nRBC: 0.2 % (ref 0.0–0.2)

## 2023-04-17 LAB — TYPE AND SCREEN
ABO/RH(D): A NEG
Antibody Screen: NEGATIVE
Unit division: 0
Unit division: 0
Unit division: 0
Unit division: 0

## 2023-04-17 LAB — BASIC METABOLIC PANEL
Anion gap: 7 (ref 5–15)
BUN: 9 mg/dL (ref 8–23)
CO2: 26 mmol/L (ref 22–32)
Calcium: 8.2 mg/dL — ABNORMAL LOW (ref 8.9–10.3)
Chloride: 106 mmol/L (ref 98–111)
Creatinine, Ser: 0.94 mg/dL (ref 0.44–1.00)
GFR, Estimated: >60 mL/min (ref >60)
Glucose, Bld: 108 mg/dL — ABNORMAL HIGH (ref 70–99)
Potassium: 4.2 mmol/L (ref 3.5–5.1)
Sodium: 139 mmol/L (ref 135–145)

## 2023-04-17 LAB — BPAM RBC
Blood Product Expiration Date: 202501182359
Blood Product Expiration Date: 202501182359
Blood Product Expiration Date: 202501192359
Blood Product Expiration Date: 202501192359
ISSUE DATE / TIME: 202412290616
ISSUE DATE / TIME: 202412291053
ISSUE DATE / TIME: 202412301145
ISSUE DATE / TIME: 202412311438
Unit Type and Rh: 600
Unit Type and Rh: 600
Unit Type and Rh: 600
Unit Type and Rh: 600

## 2023-04-17 LAB — GLUCOSE, CAPILLARY
Glucose-Capillary: 100 mg/dL — ABNORMAL HIGH (ref 70–99)
Glucose-Capillary: 102 mg/dL — ABNORMAL HIGH (ref 70–99)
Glucose-Capillary: 96 mg/dL (ref 70–99)
Glucose-Capillary: 110 mg/dL — ABNORMAL HIGH (ref 70–99)

## 2023-04-17 MED ORDER — CHLORHEXIDINE GLUCONATE CLOTH 2 % EX PADS
6.0000 | MEDICATED_PAD | Freq: Every day | CUTANEOUS | Status: DC
  Administered 2023-04-17 – 2023-05-01 (×15): 6 via TOPICAL

## 2023-04-17 MED ORDER — SODIUM CHLORIDE 0.9% FLUSH
10.0000 mL | Freq: Two times a day (BID) | INTRAVENOUS | Status: DC
  Administered 2023-04-17 – 2023-04-20 (×8): 10 mL
  Administered 2023-04-21: 40 mL
  Administered 2023-04-21 – 2023-05-02 (×20): 10 mL

## 2023-04-17 NOTE — Progress Notes (Signed)
 Occupational Therapy Treatment Patient Details Name: Peggy House MRN: [id number] DOB: 05-17-1960 Today's Date: 04/17/2023   History of present illness presented with syncopal episode and admitted for symptomatic anemia and R ankle fracture, external reduction performed 12/29, surgery planned in a few days  PMH includes: hereditary Hemorrhagic Telangiectasia, blood loss anemia, MDD, GAD, arthritis, HTN, obesity, DM2.   OT comments  Pt in good spirits, eager to participate and improve. Pt has been at bed level since initial eval, Pt has not gotten OOB since initial eval., per RN Pt on bedrest, do not see order for bedrest, was able to sit EOB with max A and stand with mod A while maintaining NWB precautions during eval. Pt completed bed mobility today, minimal redness noted to backside, pillow place under R side to relieve pressure. Pt instructed on BUE exercises with theraband, and bed level LLE exercises. Will continue to see Pt acutely, PT order placed to maximize progress while awaiting surgery. DC to postacute <3hrs/day.      If plan is discharge home, recommend the following:  Two people to help with walking and/or transfers;A lot of help with bathing/dressing/bathroom;Assistance with cooking/housework;Assist for transportation;Help with stairs or ramp for entrance   Equipment Recommendations  Other (comment) (defer)    Recommendations for Other Services PT consult    Precautions / Restrictions Precautions Precautions: Fall Restrictions Weight Bearing Restrictions Per Provider Order: Yes RLE Weight Bearing Per Provider Order: Non weight bearing       Mobility Bed Mobility Overal bed mobility: Needs Assistance Bed Mobility: Rolling Rolling: Mod assist, Used rails         General bed mobility comments: mod A with rails for rolling L/R    Transfers Overall transfer level: Needs assistance                 General transfer comment: not attempted due to NWB  precautions, per RN Pt on bedrest for upcoming surgery next week     Balance Overall balance assessment: Needs assistance                                         ADL either performed or assessed with clinical judgement   ADL Overall ADL's : Needs assistance/impaired                                       General ADL Comments: bed level for ADLs currently due to NWB, RN has not been sitting EOB or attempting standing due to RLE fxs and upcoming surgery next week, although Pt did will with bed mobility and STS while maintaining precautions during initial eval.    Extremity/Trunk Assessment Upper Extremity Assessment Upper Extremity Assessment: Overall WFL for tasks assessed LUE Deficits / Details: hx of carpal tunnel surgery 2 years ago, still numb in palm, drops items often. Pt has difficulty with FM/opposition LUE Sensation: decreased light touch LUE Coordination: decreased fine motor            Vision       Perception     Praxis      Cognition Arousal: Alert Behavior During Therapy: WFL for tasks assessed/performed Overall Cognitive Status: Within Functional Limits for tasks assessed  Exercises      Shoulder Instructions       General Comments      Pertinent Vitals/ Pain       Pain Assessment Pain Assessment: Faces Faces Pain Scale: Hurts little more Pain Location: R ankle Pain Descriptors / Indicators: Aching, Grimacing Pain Intervention(s): Monitored during session  Home Living                                          Prior Functioning/Environment              Frequency  Min 1X/week        Progress Toward Goals  OT Goals(current goals can now be found in the care plan section)  Progress towards OT goals: Progressing toward goals  Acute Rehab OT Goals Patient Stated Goal: to manage pain OT Goal Formulation: With patient Time For  Goal Achievement: 04/29/23 Potential to Achieve Goals: Good ADL Goals Pt Will Perform Lower Body Dressing: with min assist;with adaptive equipment;sitting/lateral leans Pt Will Transfer to Toilet: with contact guard assist;bedside commode;stand pivot transfer Pt Will Perform Toileting - Clothing Manipulation and hygiene: with set-up;with supervision;sitting/lateral leans Additional ADL Goal #1: Pt will be able to complete bed mobility with supervision to prepare for transfers/ADLs and maximize independence.  Plan      Co-evaluation                 AM-PAC OT 6 Clicks Daily Activity     Outcome Measure   Help from another person eating meals?: None Help from another person taking care of personal grooming?: A Little Help from another person toileting, which includes using toliet, bedpan, or urinal?: A Lot Help from another person bathing (including washing, rinsing, drying)?: A Lot Help from another person to put on and taking off regular upper body clothing?: A Little Help from another person to put on and taking off regular lower body clothing?: A Lot 6 Click Score: 16    End of Session    OT Visit Diagnosis: Unsteadiness on feet (R26.81);Other abnormalities of gait and mobility (R26.89);Muscle weakness (generalized) (M62.81);Pain Pain - Right/Left: Right Pain - part of body: Ankle and joints of foot   Activity Tolerance Patient tolerated treatment well   Patient Left in bed;with call bell/phone within reach;with bed alarm set   Nurse Communication Mobility status        Time: 8354-8282 OT Time Calculation (min): 32 min  Charges: OT General Charges $OT Visit: 1 Visit OT Treatments $Therapeutic Activity: 8-22 mins $Therapeutic Exercise: 8-22 mins  Peggy House, OTR/L   Peggy House 04/17/2023, 5:36 PM

## 2023-04-17 NOTE — Progress Notes (Signed)
 HD#3 SUBJECTIVE:  Patient Summary:  Peggy House is a 63 y.o. with a pertinent PMH of hereditary hemorrhagic telangiectasia, chronic anemia, hypertension, hyperlipidemia, GI bleed, and type 2 diabetes , who presented with syncopal episode and admitted for symptomatic anemia and R ankle fracture.   Overnight Events: None  Interim History: She is without complaint today.  She noted that she had a bowel movement yesterday.  She denies worsening pain in her leg or paresthesias.  Dizziness or lightheadedness.  No noticing of blood in the bowel movements.  Still has URI, but feels that it is manageable.  OBJECTIVE:  Vital Signs: Vitals:   04/16/23 1614 04/16/23 1747 04/16/23 1944 04/17/23 0403  BP: 138/66  (!) 150/66 139/66  Pulse: (!) 56  61 (!) 55  Resp: 18 18 19 19   Temp: 98.2 F (36.8 C)  98.9 F (37.2 C) 98.5 F (36.9 C)  TempSrc:      SpO2: 97%  99% 97%  Weight:      Height:       Supplemental O2: Room Air SpO2: 97 %  Filed Weights   04/13/23 0313  Weight: 104.1 kg     Intake/Output Summary (Last 24 hours) at 04/17/2023 0645 Last data filed at 04/17/2023 0037 Gross per 24 hour  Intake 340 ml  Output 1240 ml  Net -900 ml   Net IO Since Admission: -289.83 mL [04/17/23 0645]  Physical Exam: Physical Exam Constitutional:      Appearance: She is not ill-appearing.  Cardiovascular:     Rate and Rhythm: Normal rate.  Pulmonary:     Effort: Pulmonary effort is normal. CTABL. Abdominal:     General: Abdomen is flat.  Musculoskeletal:     Comments: R ankle in splint. Swelling beneath bandages appears reduced. The splint is intact and snug. R leg/foot is neurovascularly inact. Toes are warm and able to move. She has some trouble localizing sensation to the toes. Skin:    General: Skin is warm.     Capillary Refill: Capillary refill takes less than 2 seconds.  Neurological:     Mental Status: She is alert.  Psychiatric:        Mood and Affect: Mood normal.         Behavior: Behavior normal.   Patient Lines/Drains/Airways Status     Active Line/Drains/Airways     Name Placement date Placement time Site Days   Implanted Port 07/08/18 Right Chest 07/08/18  --  Chest  1744   Peripheral IV 04/15/23 20 G Anterior;Right Wrist 04/15/23  1749  Wrist  2             ASSESSMENT/PLAN:  Assessment: Principal Problem:   Closed right trimalleolar fracture Active Problems:   Acute on chronic blood loss anemia   Anxiety   Essential hypertension   Obesity, Class III, BMI 40-49.9 (morbid obesity) (HCC)   Recurrent epistaxis   Type 2 diabetes mellitus with vascular disease (HCC)   Syncope   HHT (hereditary hemorrhagic telangiectasia) (HCC)   Tobacco use disorder   Thrombocytopenia (HCC)  Peggy House is a 63 y.o. woman living with a history of hereditary hemorrhagic telangiectasia, chronic iron  deficiency anemia, type 2 diabetes, tobacco use disorder, GAD who presented following a syncopal episode and admitted for symptomatic anemia and R ankle fracture.  She is medically stable. We are monitoring Hg and pain in anticipation of surgery for R ankle fracture.   Plan: Trimalleolar R Ankle Fracture Ankle fracture due to fall/syncopy.  Orthopedics involved. Closed reduction Sunday 12/29. Plan for surgery late this week vs early next week depending on swelling. Need Hg above 7. No weight bearing. - Orthopedics following, thank you - Scheduled Tylenol  1 g 3 times daily, oxycodone  10mg  q6h, dilaudid  0.5mg  q3 prn. For bowel regimen, Senna-docusate BID, Miralax  BID. - Elevate leg, ice packs   Symptomatic anemia Hereditary hemorrhagic telangiectasia Goal to keep Hg above 8, at goal, s/p 4u PRBCs.  Given HHT, history of multiple bleeding events, including GI, but denies any now.  Bleeding primarily seems to have been secondary to epistaxis. - Monitor Hg, attempt to keep above 8. Monitor for signs of bleeding. For recurrent nosebleeds, attempt  control with cold packs and pressure. If persistent, consider ENT vs silver  nitrate.   Syncope due to acute on chronic blood loss anemia  - Echo without valvular disease. Grade 2 diastolic dysfunction. - Stop telemetry - CT head without acute process - Holding home BP regimen for now, will monitor BPs   URI, Dyspnea Not hypoxic.  Report subjective dyspnea and a new cough.  Lung exam stable.  CXR negative.  Will treat with Mucinex .   Acute kidney injury (resolved) Creatinine improved to 0.86, was as high as 1.18, baseline around 0.80. Likely prerenal. Intravascular repletion via blood per above.   Type 2 diabetes Last A1c 02/01/2023, 5.0.  She seems to be well-controlled for many years.  Plan: - Hold metformin  in setting of AKI - CBG   Tobacco use disorder Half pack a day smoker. Plan: - Will hold nicotine  patch in setting of possible surgery -No PFTs on file to formally diagnose COPD, resume home albuterol    GAD Resume PTA patient monitored trazodone , Remeron , Celexa    Best Practice: Diet: Cardiac diet IVF: None VTE: SCDs Start: 04/13/23 0620 Code: DNR/DNI AB: None DISPO: Anticipated discharge in 6 days to home vs SNF pending Surgery.  Signature: Lonni Africa, D.O.  Internal Medicine Resident, PGY-1 [name]  Pager: [phone] 6:45 AM, 04/17/2023   Please contact the on call pager after 5 pm and on weekends at [phone].

## 2023-04-17 NOTE — Plan of Care (Signed)

## 2023-04-17 NOTE — Plan of Care (Signed)
   Problem: Coping: Goal: Ability to adjust to condition or change in health will improve Outcome: Progressing   Problem: Metabolic: Goal: Ability to maintain appropriate glucose levels will improve Outcome: Progressing   Problem: Skin Integrity: Goal: Risk for impaired skin integrity will decrease Outcome: Progressing   Problem: Activity: Goal: Risk for activity intolerance will decrease Outcome: Progressing   Problem: Safety: Goal: Ability to remain free from injury will improve Outcome: Progressing

## 2023-04-18 ENCOUNTER — Inpatient Hospital Stay: Payer: Medicaid Other

## 2023-04-18 ENCOUNTER — Inpatient Hospital Stay (HOSPITAL_COMMUNITY): Payer: Medicaid Other

## 2023-04-18 ENCOUNTER — Inpatient Hospital Stay: Payer: Medicaid Other | Admitting: Hematology

## 2023-04-18 DIAGNOSIS — R0989 Other specified symptoms and signs involving the circulatory and respiratory systems: Secondary | ICD-10-CM | POA: Diagnosis not present

## 2023-04-18 DIAGNOSIS — R55 Syncope and collapse: Secondary | ICD-10-CM | POA: Diagnosis not present

## 2023-04-18 DIAGNOSIS — E119 Type 2 diabetes mellitus without complications: Secondary | ICD-10-CM | POA: Diagnosis not present

## 2023-04-18 DIAGNOSIS — F411 Generalized anxiety disorder: Secondary | ICD-10-CM

## 2023-04-18 DIAGNOSIS — S82854D Nondisplaced trimalleolar fracture of right lower leg, subsequent encounter for closed fracture with routine healing: Secondary | ICD-10-CM | POA: Diagnosis not present

## 2023-04-18 DIAGNOSIS — D500 Iron deficiency anemia secondary to blood loss (chronic): Secondary | ICD-10-CM | POA: Diagnosis not present

## 2023-04-18 DIAGNOSIS — J069 Acute upper respiratory infection, unspecified: Secondary | ICD-10-CM

## 2023-04-18 DIAGNOSIS — I517 Cardiomegaly: Secondary | ICD-10-CM | POA: Diagnosis not present

## 2023-04-18 DIAGNOSIS — R059 Cough, unspecified: Secondary | ICD-10-CM | POA: Diagnosis not present

## 2023-04-18 DIAGNOSIS — F17210 Nicotine dependence, cigarettes, uncomplicated: Secondary | ICD-10-CM | POA: Diagnosis not present

## 2023-04-18 DIAGNOSIS — I780 Hereditary hemorrhagic telangiectasia: Secondary | ICD-10-CM | POA: Diagnosis not present

## 2023-04-18 LAB — GLUCOSE, CAPILLARY
Glucose-Capillary: 96 mg/dL (ref 70–99)
Glucose-Capillary: 99 mg/dL (ref 70–99)
Glucose-Capillary: 98 mg/dL (ref 70–99)
Glucose-Capillary: 102 mg/dL — ABNORMAL HIGH (ref 70–99)

## 2023-04-18 LAB — CBC
HCT: 28.5 % — ABNORMAL LOW (ref 36.0–46.0)
Hemoglobin: 8.4 g/dL — ABNORMAL LOW (ref 12.0–15.0)
MCH: 26.3 pg (ref 26.0–34.0)
MCHC: 29.5 g/dL — ABNORMAL LOW (ref 30.0–36.0)
MCV: 89.3 fL (ref 80.0–100.0)
Platelets: 206 10*3/uL (ref 150–400)
RBC: 3.19 MIL/uL — ABNORMAL LOW (ref 3.87–5.11)
RDW: 18.3 % — ABNORMAL HIGH (ref 11.5–15.5)
WBC: 10.5 10*3/uL (ref 4.0–10.5)
nRBC: 0 % (ref 0.0–0.2)

## 2023-04-18 MED ORDER — DM-GUAIFENESIN ER 30-600 MG PO TB12
2.0000 | ORAL_TABLET | Freq: Two times a day (BID) | ORAL | Status: DC
  Administered 2023-04-18 – 2023-04-22 (×8): 2 via ORAL
  Filled 2023-04-18 (×3): qty 2
  Filled 2023-04-18 (×2): qty 1
  Filled 2023-04-18 (×3): qty 2

## 2023-04-18 NOTE — Progress Notes (Addendum)
 HD#4 SUBJECTIVE:  Patient Summary: Peggy House is a 63 y.o. with a pertinent PMH of hereditary hemorrhagic telangiectasia, chronic anemia, hypertension, hyperlipidemia, GI bleed, and type 2 diabetes , who presented with syncopal episode and admitted for symptomatic anemia and R ankle fracture.    Overnight Events: None  Interim History: She is without complaint today.  She noted that she had a bowel movement two days ago.  She denies worsening pain in her leg or paresthesias.  No dizziness or lightheadedness.  Got out of bed briefly, went well. No noticing of blood in the bowel movements.  Continues to have cough which sounds congested.  She thinks this is going on longer than her usual cold' illnesses. Still no dyspnea.  OBJECTIVE:  Vital Signs: Vitals:   04/18/23 0725 04/18/23 1642 04/18/23 1753 04/18/23 1842  BP: 134/76 (!) 169/73 (!) 154/102 (!) 160/75  Pulse: (!) 50 (!) 55 (!) 57 (!) 52  Resp: 18 18 18 16   Temp: 98.6 F (37 C)  99.1 F (37.3 C) 98.8 F (37.1 C)  TempSrc:   Oral Axillary  SpO2: 95% 99%  99%  Weight:      Height:       Supplemental O2: Room Air SpO2: 99 %  Filed Weights   04/13/23 0313  Weight: 104.1 kg     Intake/Output Summary (Last 24 hours) at 04/18/2023 1923 Last data filed at 04/18/2023 1700 Gross per 24 hour  Intake --  Output 1150 ml  Net -1150 ml   Net IO Since Admission: -1,569.83 mL [04/18/23 1923]  Physical Exam: Physical Exam Constitutional:      Appearance: She is not ill-appearing.  Cardiovascular:     Rate and Rhythm: Normal rate.  Pulmonary:     Effort: Pulmonary effort is normal. CTABL. Auscultated front and back. Abdominal:     General: Abdomen is flat.  Musculoskeletal:     Comments: R ankle in splint. Swelling beneath bandages continues to improve. The splint is intact and snug. R leg/foot is neurovascularly inact. Toes are warm and able to move.   Skin:    General: Skin is warm.     Capillary Refill:  Capillary refill takes less than 2 seconds.  Neurological:     Mental Status: She is alert.  Psychiatric:        Mood and Affect: Mood normal.        Behavior: Behavior normal.   Patient Lines/Drains/Airways Status     Active Line/Drains/Airways     Name Placement date Placement time Site Days   Implanted Port 07/08/18 Right Chest 07/08/18  --  Chest  1745   Peripheral IV 04/15/23 20 G Anterior;Right Wrist 04/15/23  1749  Wrist  3             ASSESSMENT/PLAN:  Assessment: Principal Problem:   Closed right trimalleolar fracture Active Problems:   Acute on chronic blood loss anemia   Syncope   HHT (hereditary hemorrhagic telangiectasia) (HCC)   Anxiety   Essential hypertension   Obesity, Class III, BMI 40-49.9 (morbid obesity) (HCC)   Cough   Type 2 diabetes mellitus with vascular disease (HCC)   Thrombocytopenia (HCC)  Peggy House is a 63 y.o. woman with a PMH of hereditary hemorrhagic telangiectasia, chronic iron  deficiency anemia, type 2 diabetes, tobacco use disorder, GAD who presented following a syncopal episode and admitted for symptomatic anemia and R ankle fracture.   She is medically stable. We are monitoring Hg and pain in  anticipation of surgery for R ankle fracture.   Plan: Trimalleolar R Ankle Fracture Ankle fracture due to fall/syncopy. Orthopedics involved. Closed reduction Sunday 12/29. Plan for surgery late this week vs early next week depending on swelling. Need Hg above 7. No weight bearing. - Orthopedics following, thank you - Scheduled Tylenol  1 g 3 times daily, oxycodone  10mg  q6h, dilaudid  0.5mg  q3 prn. For bowel regimen, Senna-docusate BID, Miralax  BID. - Elevate leg, ice packs   Symptomatic anemia Hereditary hemorrhagic telangiectasia Goal to keep Hg above 8, at goal, s/p 4u PRBCs.  Given HHT, history of multiple bleeding events, including GI, but denies any now.  Bleeding primarily seems to have been secondary to epistaxis. -  Monitor Hg, attempt to keep above 8. Monitor for signs of bleeding. For recurrent nosebleeds, attempt control with cold packs and pressure. If persistent, consider ENT for hemostasis   Syncope due to acute on chronic blood loss anemia - Echo without valvular disease. Grade 2 diastolic dysfunction. - Holding home BP regimen for now, will monitor BPs   URI, cough   Not hypoxic.  Report subjective dyspnea and a new cough a few days ago.  Lung exam stable.  CXR negative a few days ago.  Treating with guaifenesin . Add dextromethorphan .   Acute kidney injury (resolved) Creatinine improved to 0.86, was as high as 1.18, baseline around 0.80. Likely prerenal. Intravascular repletion via blood per above.   Type 2 diabetes Last A1c 02/01/2023, 5.0.  She seems to be well-controlled for many years.  Plan: - Hold (discontinue altogether?) metformin  in setting of AKI (which is now resolved) - CBG   Tobacco use disorder Half pack a day smoker. Plan: - Will hold nicotine  patch in setting of possible surgery -No PFTs on file to formally diagnose COPD, resume home albuterol    GAD Resume PTA patient monitored trazodone , Remeron , Celexa    Best Practice: Diet: Cardiac diet IVF: None VTE: SCDs Start: 04/13/23 0620 Code: DNR/DNI AB: None DISPO: Anticipated discharge in 6 days to home vs SNF pending Surgery.  Signature: Lonni Africa, D.O.  Internal Medicine Resident, PGY-1 [name]  Pager: [phone] 7:23 PM, 04/18/2023   Please contact the on call pager after 5 pm and on weekends at [phone].

## 2023-04-18 NOTE — Evaluation (Signed)
 Physical Therapy Evaluation Patient Details Name: Peggy House MRN: [id number] DOB: May 20, 1960 Today's Date: 04/18/2023  History of Present Illness  presented with syncopal episode and admitted for symptomatic anemia and R ankle fracture, external reduction performed 12/29, surgery planned in a few days  PMH includes: hereditary Hemorrhagic Telangiectasia, blood loss anemia, MDD, GAD, arthritis, HTN, obesity, DM2.  Clinical Impression  Received pt semi-reclined in bed, reporting being uncomfortable. Pt agreed to repositioning with gentle encouragement. Pt required mod A for bed mobility with HOB elevated and use of bedrails with assist to manage RLE due to pain. Pt required x 2 attempts and mod A to stand from elevated EOB. Did not attempt gait as pt with difficulty maintaining RLE NWB precautions, demonstrating more TDWB, but verbalized not placing any weight through RLE. While standing, removed soiled chuck pads and placed clean ones. Then pt performed lateral scoots to L HOB and returned to semi-reclined. Increased time spent adjusting/elevating RLE on pillows for comfort and edema management. Pt reports living with her son (who works 3am-3pm) in apartment with level entry. Pt required assist with ADL's prior to admission and recommend continued PT services in less intense setting < 3hrs/day to address current impairments. Acute PT to cont to follow.       If plan is discharge home, recommend the following: A lot of help with bathing/dressing/bathroom;Two people to help with walking and/or transfers;Help with stairs or ramp for entrance;Assist for transportation   Can travel by private vehicle   No    Equipment Recommendations Other (comment) (TBD in next venue)  Recommendations for Other Services       Functional Status Assessment Patient has had a recent decline in their functional status and demonstrates the ability to make significant improvements in function in a reasonable and  predictable amount of time.     Precautions / Restrictions Precautions Precautions: Fall Restrictions Weight Bearing Restrictions Per Provider Order: Yes RLE Weight Bearing Per Provider Order: Non weight bearing      Mobility  Bed Mobility Overal bed mobility: Needs Assistance Bed Mobility: Rolling, Supine to Sit, Sit to Supine Rolling: Mod assist, Used rails   Supine to sit: Mod assist, Used rails Sit to supine: Mod assist, Used rails, HOB elevated   General bed mobility comments: assist for RLE management only. Pt required increased time due to pain - cues for pursed lip breathing throughout Patient Response: Cooperative  Transfers Overall transfer level: Needs assistance Equipment used: Rolling walker (2 wheels) Transfers: Sit to/from Stand Sit to Stand: Mod assist, From elevated surface           General transfer comment: attempted x 2 to stand from elevated EOB with RW. Pt demoinstrating more TDWB rather than NWB on RLE but reported not placing any weight on RLE. Required cues for technique    Ambulation/Gait               General Gait Details: unable  Stairs            Wheelchair Mobility     Tilt Bed Tilt Bed Patient Response: Cooperative  Modified Rankin (Stroke Patients Only)       Balance Overall balance assessment: Needs assistance Sitting-balance support: Feet supported, Bilateral upper extremity supported Sitting balance-Leahy Scale: Fair Sitting balance - Comments: able to maintain sitting balance with supervision   Standing balance support: During functional activity, Reliant on assistive device for balance, Bilateral upper extremity supported (RW) Standing balance-Leahy Scale: Poor Standing balance comment: heavy reliance  on RW for BUE support. Altered balance strategies due to RLE NWB precautions                             Pertinent Vitals/Pain Pain Assessment Pain Assessment: 0-10 Pain Score: 8  Pain  Location: R ankle Pain Descriptors / Indicators: Aching, Grimacing, Tingling Pain Intervention(s): Monitored during session, Repositioned    Home Living Family/patient expects to be discharged to:: Private residence Living Arrangements: Children (son) Available Help at Discharge: Family;Available PRN/intermittently (son works but sister can stay as long as needed) Type of Home: Apartment Home Access: Level entry       Home Layout: One level Home Equipment: [employer] (2 wheels);BSC/3in1 Additional Comments: Pt lives with son who works 3pm-3am    Prior Function Prior Level of Function : Needs assist             Mobility Comments: pt reported using RW off and on for a few months ADLs Comments: needed assist with dressing     Extremity/Trunk Assessment   Upper Extremity Assessment Upper Extremity Assessment: Defer to OT evaluation    Lower Extremity Assessment Lower Extremity Assessment: Generalized weakness;RLE deficits/detail RLE Deficits / Details: pt reports tingling in RLE RLE: Unable to fully assess due to pain    Cervical / Trunk Assessment Cervical / Trunk Assessment: Normal  Communication   Communication Communication: No apparent difficulties  Cognition Arousal: Alert Behavior During Therapy: WFL for tasks assessed/performed Overall Cognitive Status: Within Functional Limits for tasks assessed                                          General Comments      Exercises     Assessment/Plan    PT Assessment Patient needs continued PT services  PT Problem List Decreased strength;Decreased range of motion;Decreased activity tolerance;Decreased balance;Decreased mobility;Decreased coordination;Impaired sensation;Obesity;Pain       PT Treatment Interventions DME instruction;Gait training;Functional mobility training;Therapeutic activities;Therapeutic exercise;Balance training;Neuromuscular re-education;Patient/family education     PT Goals (Current goals can be found in the Care Plan section)  Acute Rehab PT Goals Patient Stated Goal: to get stronger PT Goal Formulation: With patient Time For Goal Achievement: 05/02/23 Potential to Achieve Goals: Fair    Frequency Min 1X/week     Co-evaluation               AM-PAC PT 6 Clicks Mobility  Outcome Measure Help needed turning from your back to your side while in a flat bed without using bedrails?: A Lot Help needed moving from lying on your back to sitting on the side of a flat bed without using bedrails?: A Lot Help needed moving to and from a bed to a chair (including a wheelchair)?: Total Help needed standing up from a chair using your arms (e.g., wheelchair or bedside chair)?: A Lot Help needed to walk in hospital room?: Total Help needed climbing 3-5 steps with a railing? : Total 6 Click Score: 9    End of Session Equipment Utilized During Treatment: Gait belt Activity Tolerance: Patient limited by pain Patient left: in bed;with call bell/phone within reach;with bed alarm set Nurse Communication: Mobility status PT Visit Diagnosis: Unsteadiness on feet (R26.81);Other abnormalities of gait and mobility (R26.89);Muscle weakness (generalized) (M62.81);Pain Pain - Right/Left: Right Pain - part of body: Leg;Ankle and joints of foot  Time: 0801-0822 PT Time Calculation (min) (ACUTE ONLY): 21 min   Charges:   PT Evaluation $PT Eval Moderate Complexity: 1 Mod   PT General Charges $$ ACUTE PT VISIT: 1 Visit         Therisa Stains PT, DPT Therisa HERO Zaunegger 04/18/2023, 9:08 AM

## 2023-04-18 NOTE — TOC Progression Note (Signed)
 Transition of Care [facility]) - Progression Note    Patient Details  Name: Peggy House MRN: [id number] Date of Birth: 02/22/1961  Transition of Care [facility]) CM/SW Contact  Rosaline JONELLE Joe, RN Phone Number: 04/18/2023, 3:07 PM  Clinical Narrative:    CM met with the patient at the bedside to discuss TOC needs.  The patient is currently waiting for Right ankle surgery - pending surgery date for Monday/Tuesday of next week.  Patient was living with her son, Daril in the home prior to admission and plans to likely return home to live with her brother post-surgery.  Patient states that her brother lives at [address], [place], [place] [address] in a home.  Patient's brother's girlfriend is available in the home during the day to assist if needed per the patient.  Patient has Right chest port-a-cath that is currently accessed and receiving IV fluids.  Patient has DME at the home including RW and 3:1.  Patient is a current smoker and was provided with cessation education to aide in post-op healing.  Resources to be included in the patient's AVS.  CM and MSW will continue to follow the patient for TOC needs.        Expected Discharge Plan and Services                                               Social Determinants of Health (SDOH) Interventions SDOH Screenings   Food Insecurity: No Food Insecurity (04/13/2023)  Housing: High Risk (04/13/2023)  Transportation Needs: No Transportation Needs (04/13/2023)  Recent Concern: Transportation Needs - Unmet Transportation Needs (01/31/2023)  Utilities: Not At Risk (04/13/2023)  Recent Concern: Utilities - At Risk (01/31/2023)  Depression (PHQ2-9): High Risk (06/20/2022)  Tobacco Use: High Risk (04/13/2023)    Readmission Risk Interventions    04/18/2023    3:07 PM 04/15/2023    2:22 PM  Readmission Risk Prevention Plan  Transportation Screening Complete Complete  PCP or Specialist Appt within 5-7 Days  Complete   PCP or Specialist Appt within 3-5 Days Complete   Home Care Screening  Complete  Medication Review (RN CM)  Complete  HRI or Home Care Consult Complete   Social Work Consult for Recovery Care Planning/Counseling Complete   Palliative Care Screening Complete   Medication Review [employer]) Complete

## 2023-04-19 ENCOUNTER — Inpatient Hospital Stay: Payer: Medicaid Other | Attending: Hematology

## 2023-04-19 DIAGNOSIS — W19XXXD Unspecified fall, subsequent encounter: Secondary | ICD-10-CM | POA: Diagnosis not present

## 2023-04-19 DIAGNOSIS — S82851D Displaced trimalleolar fracture of right lower leg, subsequent encounter for closed fracture with routine healing: Secondary | ICD-10-CM | POA: Diagnosis not present

## 2023-04-19 LAB — GLUCOSE, CAPILLARY
Glucose-Capillary: 136 mg/dL — ABNORMAL HIGH (ref 70–99)
Glucose-Capillary: 104 mg/dL — ABNORMAL HIGH (ref 70–99)
Glucose-Capillary: 104 mg/dL — ABNORMAL HIGH (ref 70–99)
Glucose-Capillary: 115 mg/dL — ABNORMAL HIGH (ref 70–99)
Glucose-Capillary: 125 mg/dL — ABNORMAL HIGH (ref 70–99)
Glucose-Capillary: 132 mg/dL — ABNORMAL HIGH (ref 70–99)

## 2023-04-19 LAB — CBC
HCT: 27.5 % — ABNORMAL LOW (ref 36.0–46.0)
HCT: 25.5 % — ABNORMAL LOW (ref 36.0–46.0)
Hemoglobin: 8.3 g/dL — ABNORMAL LOW (ref 12.0–15.0)
Hemoglobin: 7.8 g/dL — ABNORMAL LOW (ref 12.0–15.0)
MCH: 26.7 pg (ref 26.0–34.0)
MCH: 26.8 pg (ref 26.0–34.0)
MCHC: 30.2 g/dL (ref 30.0–36.0)
MCHC: 30.6 g/dL (ref 30.0–36.0)
MCV: 88.4 fL (ref 80.0–100.0)
MCV: 87.6 fL (ref 80.0–100.0)
Platelets: 255 10*3/uL (ref 150–400)
Platelets: 279 10*3/uL (ref 150–400)
RBC: 3.11 MIL/uL — ABNORMAL LOW (ref 3.87–5.11)
RBC: 2.91 MIL/uL — ABNORMAL LOW (ref 3.87–5.11)
RDW: 18 % — ABNORMAL HIGH (ref 11.5–15.5)
RDW: 17.9 % — ABNORMAL HIGH (ref 11.5–15.5)
WBC: 11.2 10*3/uL — ABNORMAL HIGH (ref 4.0–10.5)
WBC: 10.6 10*3/uL — ABNORMAL HIGH (ref 4.0–10.5)
nRBC: 0 % (ref 0.0–0.2)
nRBC: 0 % (ref 0.0–0.2)

## 2023-04-19 LAB — BASIC METABOLIC PANEL
Anion gap: 7 (ref 5–15)
BUN: 12 mg/dL (ref 8–23)
CO2: 26 mmol/L (ref 22–32)
Calcium: 8.4 mg/dL — ABNORMAL LOW (ref 8.9–10.3)
Chloride: 105 mmol/L (ref 98–111)
Creatinine, Ser: 0.94 mg/dL (ref 0.44–1.00)
GFR, Estimated: >60 mL/min (ref >60)
Glucose, Bld: 115 mg/dL — ABNORMAL HIGH (ref 70–99)
Potassium: 4.1 mmol/L (ref 3.5–5.1)
Sodium: 138 mmol/L (ref 135–145)

## 2023-04-19 MED ORDER — SILVER NITRATE-POT NITRATE 75-25 % EX MISC
1.0000 | Freq: Once | CUTANEOUS | Status: AC
  Administered 2023-04-19: 1 via TOPICAL
  Filled 2023-04-19: qty 1

## 2023-04-19 MED ORDER — OXYCODONE HCL ER 10 MG PO T12A
10.0000 mg | EXTENDED_RELEASE_TABLET | Freq: Two times a day (BID) | ORAL | Status: DC
  Administered 2023-04-19 – 2023-04-22 (×6): 10 mg via ORAL
  Filled 2023-04-19 (×6): qty 1

## 2023-04-19 MED ORDER — HYDROMORPHONE HCL 1 MG/ML IJ SOLN
1.0000 mg | Freq: Four times a day (QID) | INTRAMUSCULAR | Status: DC | PRN
  Administered 2023-04-19 – 2023-04-22 (×6): 1 mg via INTRAVENOUS
  Filled 2023-04-19 (×10): qty 1

## 2023-04-19 MED ORDER — OXYCODONE HCL 5 MG PO TABS
10.0000 mg | ORAL_TABLET | Freq: Once | ORAL | Status: AC
  Administered 2023-04-19: 10 mg via ORAL
  Filled 2023-04-19: qty 2

## 2023-04-19 NOTE — Progress Notes (Signed)
 Subjective: 63 y/o female who suffered a syncopal episode and was admitted for symptomatic anemia and trimalleolar fracture of the right ankle.  Closed reduction on 12/29 with ORIF planned for next week.  Dr. Sherida following.  The patient was evaluated today in her hospital room.  She was sitting in her bed and resting comfortably.  She describes moderate pain.  She states that her pain symptoms have improved somewhat since admission.  She endorses improvement in hypoesthesia and paresthesias to the toes of the right lower extremity.  Objective: Vital signs in last 24 hours: Temp:  [98 F (36.7 C)-99.1 F (37.3 C)] 98 F (36.7 C) (01/04 1514) Pulse Rate:  [52-67] 64 (01/04 1514) Resp:  [16-20] 18 (01/04 1514) BP: (112-169)/(65-102) 142/65 (01/04 1514) SpO2:  [98 %-100 %] 100 % (01/04 1514)  Intake/Output from previous day: 01/03 0701 - 01/04 0700 In: -  Out: 500 [Urine:500] Intake/Output this shift: No intake/output data recorded.  Recent Labs    04/17/23 0518 04/18/23 0246 04/19/23 0423  HGB 8.3* 8.4* 8.3*   Recent Labs    04/18/23 0246 04/19/23 0423  WBC 10.5 11.2*  RBC 3.19* 3.11*  HCT 28.5* 27.5*  PLT 206 255   Recent Labs    04/17/23 0518 04/19/23 0423  NA 139 138  K 4.2 4.1  CL 106 105  CO2 26 26  BUN 9 12  CREATININE 0.94 0.94  GLUCOSE 108* 115*  CALCIUM  8.2* 8.4*   Physical examination today somewhat limited secondary to patient discomfort.  She is splinted with a posterior splint which I left in place.  She had no difficulty wiggling her toes.  Sensation was symmetrical on examination today.  Assessment/Plan: Trimalleolar fracture, right ankle -DVT prophylaxis per protocol -Pain medicine as needed per primary. -Patient to continue nonweightbearing status. -Splint to remain in place. -Aggressive elevation and icing of the ankle to help with swelling and pain. -Continue PT/OT. -Hemoglobin improving (8.4 today).  Anticipate ORIF early next week with  Dr. Sherida.  Peggy House [facility] 04/19/2023, 3:52 PM

## 2023-04-19 NOTE — Progress Notes (Signed)
 Pt having heavy nose bleed.  Manual pressure held x 30 minutes w/o hemestasis.  Dr. Sherrell Puller and Dr. Allena Katz notified per unit charge nurse and they came to bedside.  See note provided.

## 2023-04-19 NOTE — Progress Notes (Signed)
 HD#5 SUBJECTIVE:  Patient Summary: Peggy House is a 63 y.o. with a pertinent PMH of hereditary hemorrhagic telangiectasia, chronic anemia, hypertension, hyperlipidemia, GI bleed, and type 2 diabetes , who presented with syncopal episode and admitted for symptomatic anemia and R ankle fracture.    Overnight Events: Nosebleed overnight, resolved with pressure  Interim History: Peggy House seen bedside this morning. States Peggy House is doing well, and does not have any acute complaints. Peggy House does have gauze packed in her nares bilaterally, but states that the bleeding has slowed down significantly.  OBJECTIVE:  Vital Signs: Vitals:   04/18/23 2359 04/19/23 0108 04/19/23 0511 04/19/23 0741  BP: (!) 155/70 137/67 112/71 (!) 148/65  Pulse: 66 (!) 59 67 (!) 53  Resp:  20  18  Temp:  98.5 F (36.9 C) 98.6 F (37 C) 98.1 F (36.7 C)  TempSrc:  Axillary Oral   SpO2: 100%  99% 98%  Weight:      Height:       Supplemental O2: Room Air SpO2: 98 %  Filed Weights   04/13/23 0313  Weight: 104.1 kg     Intake/Output Summary (Last 24 hours) at 04/19/2023 0915 Last data filed at 04/18/2023 1700 Gross per 24 hour  Intake --  Output 500 ml  Net -500 ml   Net IO Since Admission: -1,569.83 mL [04/19/23 0915]  Physical Exam: Physical Exam Constitutional:      Appearance: Peggy House is not ill-appearing.  Cardiovascular:     Rate and Rhythm: Normal rate.  Pulmonary:     Effort: Pulmonary effort is normal. CTABL. Auscultated front and back. Abdominal:     General: Abdomen is flat.  Musculoskeletal:     Comments: R ankle in splint. Swelling beneath bandages continues to improve. The splint is intact and snug. R leg/foot is neurovascularly inact. Toes are warm and able to move.   Skin:    General: Skin is warm.     Capillary Refill: Capillary refill takes less than 2 seconds.  Neurological:     Mental Status: Peggy House is alert.  Psychiatric:        Mood and Affect: Mood normal.        Behavior:  Behavior normal.  HEENT: Gauze In place in bilateral nares, minimal amount of blood  Patient Lines/Drains/Airways Status     Active Line/Drains/Airways     Name Placement date Placement time Site Days   Implanted Port 07/08/18 Right Chest 07/08/18  --  Chest  1745   Peripheral IV 04/15/23 20 G Anterior;Right Wrist 04/15/23  1749  Wrist  3             ASSESSMENT/PLAN:  Assessment: Principal Problem:   Closed right trimalleolar fracture Active Problems:   Acute on chronic blood loss anemia   Anxiety   Essential hypertension   Obesity, Class III, BMI 40-49.9 (morbid obesity) (HCC)   Cough   Type 2 diabetes mellitus with vascular disease (HCC)   Syncope   HHT (hereditary hemorrhagic telangiectasia) (HCC)   Thrombocytopenia (HCC)  Peggy House is a 63 y.o. Peggy House with a PMH of hereditary hemorrhagic telangiectasia, chronic iron  deficiency anemia, type 2 diabetes, tobacco use disorder, GAD who presented following a syncopal episode and admitted for symptomatic anemia and R ankle fracture.   Peggy House is medically stable. We are monitoring Hg and pain in anticipation of surgery for R ankle fracture.   Plan: Trimalleolar R Ankle Fracture Ankle fracture due to fall/syncopy. Orthopedics involved. Closed reduction Sunday 12/29.  Plan for surgery late this week vs early next week depending on swelling. Need Hg above 7. No weight bearing. - Discussed with ortho who will re-evaluate patient today - Scheduled Tylenol  1 g 3 times daily, oxycodone  10mg  q6h, dilaudid  0.5mg  q3 prn. For bowel regimen, Senna-docusate BID, Miralax  BID. - Elevate leg, ice packs   Symptomatic anemia Hereditary hemorrhagic telangiectasia Goal to keep Hg above 8, at goal, s/p 4u PRBCs.  Given HHT, history of multiple bleeding events, including GI, but denies any now.  Bleeding primarily seems to have been secondary to epistaxis. - Monitor Hg, attempt to keep above 8. Monitor for signs of bleeding. For  recurrent nosebleeds, attempt control with cold packs and pressure. If persistent, consider ENT for hemostasis - Repeat H+H 2PM   Syncope due to acute on chronic blood loss anemia - Echo without valvular disease. Grade 2 diastolic dysfunction. - Holding home BP regimen for now, will monitor BPs   URI, cough   Not hypoxic.  Report subjective dyspnea and a new cough a few days ago.  Lung exam stable.  CXR negative a few days ago.  Treating with guaifenesin . Add dextromethorphan .   Acute kidney injury (resolved) Creatinine improved to 0.86, was as high as 1.18, baseline around 0.80. Likely prerenal. Intravascular repletion via blood per above.   Type 2 diabetes Last A1c 02/01/2023, 5.0.  Peggy House seems to be well-controlled for many years.  Plan: - Hold (discontinue altogether?) metformin  in setting of AKI (which is now resolved) - CBG   Tobacco use disorder [facility] House a day smoker. Plan: - Will hold nicotine  patch in setting of possible surgery -No PFTs on file to formally diagnose COPD, resume home albuterol    GAD Resume PTA patient monitored trazodone , Remeron , Celexa    Best Practice: Diet: Cardiac diet IVF: None VTE: SCDs Start: 04/13/23 0620 Code: DNR/DNI AB: None DISPO: Anticipated discharge in 6 days to home vs SNF pending Surgery.  Signature: Peggy House, D.O.  Internal Medicine Resident, PGY-1 [facility]  Pager: [phone] 9:15 AM, 04/19/2023   Please contact the on call pager after 5 pm and on weekends at [phone].

## 2023-04-19 NOTE — Progress Notes (Signed)
 Physical Therapy Treatment Patient Details Name: Peggy House MRN: [id number] DOB: [birth year] Today's Date: 04/19/2023   History of Present Illness presented with syncopal episode and admitted for symptomatic anemia and R ankle fracture, external reduction performed 12/29, surgery planned in a few days  PMH includes: hereditary Hemorrhagic Telangiectasia, blood loss anemia, MDD, GAD, arthritis, HTN, obesity, DM2.    PT Comments  Pt resting in bed on arrival and agreeable to session. Pt continues to be limited by pain and weakness resulting in difficulty maintaining NWB precautions with tranfers and inability to progress gait, resulting in slow but steady progress towards acute goals. Pt with improved bed mobility this session, requiring only light min A to manage RLE to and off EOB. With increased time and elevated EOB pt able to come to standing x2 with RW for support and mod A to boost up. Pt with poor adherence on rise to NWB but once standing able to lift RLE and maintain. Pt able to shift weight with heel to movement on L foot to move toward HOB in standing. Pt unable to generate force through [place] to hop on L to progress gait this session. Pt seated up EOB at end of session per pt request. Pt continues to benefit from skilled PT services to progress toward functional mobility goals. .   If plan is discharge home, recommend the following: A lot of help with bathing/dressing/bathroom;Two people to help with walking and/or transfers;Help with stairs or ramp for entrance;Assist for transportation   Can travel by private vehicle     No  Equipment Recommendations  Other (comment) (TBD in next venue)    Recommendations for Other Services       Precautions / Restrictions Precautions Precautions: Fall Restrictions Weight Bearing Restrictions Per Provider Order: Yes RLE Weight Bearing Per Provider Order: Non weight bearing     Mobility  Bed Mobility Overal bed mobility: Needs  Assistance Bed Mobility: Supine to Sit     Supine to sit: Used rails, Min assist     General bed mobility comments: assist for RLE management only. Pt required increased time due to pain    Transfers Overall transfer level: Needs assistance Equipment used: Rolling walker (2 wheels) Transfers: Sit to/from Stand Sit to Stand: Mod assist, From elevated surface           General transfer comment: attempted x 2 to stand from elevated EOB with RW. Pt demoinstrating more TDWB rather than NWB on RLE but able to maintain standing with good adherence, able to heel-to on L toward [facility]    Ambulation/Gait               General Gait Details: unable   Stairs             Wheelchair Mobility     Tilt Bed    Modified Rankin (Stroke Patients Only)       Balance Overall balance assessment: Needs assistance Sitting-balance support: Feet supported, Bilateral upper extremity supported Sitting balance-Leahy Scale: Fair Sitting balance - Comments: able to maintain sitting balance with supervision   Standing balance support: During functional activity, Reliant on assistive device for balance, Bilateral upper extremity supported (RW) Standing balance-Leahy Scale: Poor Standing balance comment: heavy reliance on RW for BUE support. Altered balance strategies due to RLE NWB precautions                            Cognition Arousal: Alert Behavior During  Therapy: WFL for tasks assessed/performed Overall Cognitive Status: Within Functional Limits for tasks assessed                                          Exercises General Exercises - Lower Extremity Long Arc Quad: AROM, Right, 10 reps, Seated Hip Flexion/Marching: AROM, Right, 10 reps, Seated    General Comments        Pertinent Vitals/Pain Pain Assessment Pain Assessment: Faces Faces Pain Scale: Hurts little more Pain Location: R ankle Pain Descriptors / Indicators: Aching, Grimacing,  Tingling Pain Intervention(s): Monitored during session, Limited activity within patient's tolerance, Repositioned    Home Living                          Prior Function            PT Goals (current goals can now be found in the care plan section) Acute Rehab PT Goals Patient Stated Goal: to get stronger PT Goal Formulation: With patient Time For Goal Achievement: 05/02/23 Progress towards PT goals: Progressing toward goals    Frequency    Min 1X/week      PT Plan      Co-evaluation              AM-PAC PT 6 Clicks Mobility   Outcome Measure  Help needed turning from your back to your side while in a flat bed without using bedrails?: A Lot Help needed moving from lying on your back to sitting on the side of a flat bed without using bedrails?: A Lot Help needed moving to and from a bed to a chair (including a wheelchair)?: Total Help needed standing up from a chair using your arms (e.g., wheelchair or bedside chair)?: A Lot Help needed to walk in hospital room?: Total Help needed climbing 3-5 steps with a railing? : Total 6 Click Score: 9    End of Session   Activity Tolerance: Patient limited by pain;Patient limited by fatigue Patient left: in bed;with call bell/phone within reach (seated up EOB) Nurse Communication: Mobility status PT Visit Diagnosis: Unsteadiness on feet (R26.81);Other abnormalities of gait and mobility (R26.89);Muscle weakness (generalized) (M62.81);Pain Pain - Right/Left: Right Pain - part of body: Leg;Ankle and joints of foot     Time: 1450-1508 PT Time Calculation (min) (ACUTE ONLY): 18 min  Charges:    $Therapeutic Activity: 8-22 mins PT General Charges $$ ACUTE PT VISIT: 1 Visit                     Margo Lama R. PTA Acute Rehabilitation Services Office: [phone]   Therisa CHRISTELLA Boor 04/19/2023, 3:22 PM

## 2023-04-19 NOTE — Progress Notes (Signed)
 Upon entering room to check on patient, she was blowing her nose.  I instructed her to stop and resist the urge but she could not resist the urge and kept blowing her nose until large clot came out.  Nose again bleeding.  Manual pressure held again and bleed slowed.  Gauze applied.  Pt holding light pressure at this time.  MD's came to bs and are aware.  Will cont to observe.

## 2023-04-19 NOTE — Progress Notes (Signed)
 Received page at 0003 regarding 30-minute history of epistaxis.  Present at bedside for assessment, prior attempts at tampon packing and pressure were unsuccessful at time of presentation.  Patient was found to have a large clot in the left nares.  Attempts were made to remove the clot to better visualize the source in order to attempt silver  nitrate cautery.  We were unsuccessful in removing the large clot.  We began again applying pressure to the left nare and were able to control bleeding with pressure alone.  We held pressure for about 30 minutes prior to leaving the patient's room.  Patient applying pressure, discussed with nursing, call bell within reach at 0100 when leaving the room.

## 2023-04-20 DIAGNOSIS — I780 Hereditary hemorrhagic telangiectasia: Secondary | ICD-10-CM | POA: Diagnosis not present

## 2023-04-20 DIAGNOSIS — D649 Anemia, unspecified: Secondary | ICD-10-CM | POA: Diagnosis not present

## 2023-04-20 DIAGNOSIS — S82851D Displaced trimalleolar fracture of right lower leg, subsequent encounter for closed fracture with routine healing: Secondary | ICD-10-CM | POA: Diagnosis not present

## 2023-04-20 LAB — GLUCOSE, CAPILLARY
Glucose-Capillary: 121 mg/dL — ABNORMAL HIGH (ref 70–99)
Glucose-Capillary: 86 mg/dL (ref 70–99)
Glucose-Capillary: 133 mg/dL — ABNORMAL HIGH (ref 70–99)
Glucose-Capillary: 99 mg/dL (ref 70–99)

## 2023-04-20 LAB — HEMOGLOBIN AND HEMATOCRIT, BLOOD
HCT: 28.5 % — ABNORMAL LOW (ref 36.0–46.0)
Hemoglobin: 8.7 g/dL — ABNORMAL LOW (ref 12.0–15.0)

## 2023-04-20 LAB — PREPARE RBC (CROSSMATCH)

## 2023-04-20 MED ORDER — DIPHENHYDRAMINE HCL 25 MG PO CAPS
25.0000 mg | ORAL_CAPSULE | Freq: Once | ORAL | Status: AC
  Administered 2023-04-20: 25 mg via ORAL
  Filled 2023-04-20: qty 1

## 2023-04-20 MED ORDER — SODIUM CHLORIDE 0.9% IV SOLUTION: Freq: Once | INTRAVENOUS | Status: AC

## 2023-04-20 NOTE — Progress Notes (Addendum)
 HD#6 SUBJECTIVE:  Patient Summary: Peggy House is a 63 y.o. with a pertinent PMH of hereditary hemorrhagic telangiectasia, chronic anemia, hypertension, hyperlipidemia, GI bleed, and type 2 diabetes , who presented with syncopal episode and admitted for symptomatic anemia and R ankle fracture.    Overnight Events: Hb returned below goal of 7.8, given a unit of blood  Interim History: Pt Pt seen bedside this AM. States she is doing well, with no bleeding over night. All questions answered to pt's satisfaction.   OBJECTIVE:  Vital Signs: Vitals:   04/20/23 0315 04/20/23 0534 04/20/23 0556 04/20/23 0740  BP: 134/67 139/63 (!) 144/68 139/63  Pulse: (!) 55 (!) 57 (!) 57 (!) 54  Resp: 16 14 16 18   Temp: 98.4 F (36.9 C) 98.7 F (37.1 C) 98 F (36.7 C) 98.4 F (36.9 C)  TempSrc: Oral Oral Oral   SpO2:  100% 98% 98%  Weight:      Height:       Supplemental O2: Room Air SpO2: 98 %  Filed Weights   04/13/23 0313  Weight: 104.1 kg     Intake/Output Summary (Last 24 hours) at 04/20/2023 1119 Last data filed at 04/20/2023 0335 Gross per 24 hour  Intake --  Output 1000 ml  Net -1000 ml   Net IO Since Admission: -2,569.83 mL [04/20/23 1119]  Physical Exam: Physical Exam Constitutional:      Appearance: She is not ill-appearing.  Cardiovascular:     Rate and Rhythm: Normal rate.  Pulmonary:     Effort: Pulmonary effort is normal. CTABL. Auscultated front and back. Abdominal:     General: Abdomen is flat.  Musculoskeletal:     Comments: R ankle in splint. Swelling beneath bandages continues to improve. The splint is intact and snug. R leg/foot is neurovascularly inact. Toes are warm and able to move.   Skin:    General: Skin is warm.     Capillary Refill: Capillary refill takes less than 2 seconds.  Neurological:     Mental Status: She is alert.  Psychiatric:        Mood and Affect: Mood normal.        Behavior: Behavior normal.  HEENT: Gauze In place in  bilateral nares, minimal amount of blood  Patient Lines/Drains/Airways Status     Active Line/Drains/Airways     Name Placement date Placement time Site Days   Implanted Port 07/08/18 Right Chest 07/08/18  --  Chest  1745   Peripheral IV 04/15/23 20 G Anterior;Right Wrist 04/15/23  1749  Wrist  3             ASSESSMENT/PLAN:  Assessment: Principal Problem:   Closed right trimalleolar fracture Active Problems:   Acute on chronic blood loss anemia   Anxiety   Essential hypertension   Obesity, Class III, BMI 40-49.9 (morbid obesity) (HCC)   Cough   Type 2 diabetes mellitus with vascular disease (HCC)   Syncope   HHT (hereditary hemorrhagic telangiectasia) (HCC)   Thrombocytopenia (HCC)  Peggy House is a 63 y.o. woman with a PMH of hereditary hemorrhagic telangiectasia, chronic iron  deficiency anemia, type 2 diabetes, tobacco use disorder, GAD who presented following a syncopal episode and admitted for symptomatic anemia and R ankle fracture.   She is medically stable. We are monitoring Hg and pain in anticipation of surgery for R ankle fracture.   Plan: Trimalleolar R Ankle Fracture Ankle fracture due to fall/syncopy. Orthopedics involved. Closed reduction Sunday 12/29. Plan for  surgery on Monday or Tuesday, may need to make NPO tonight. Pain is well controlled with outlined pain regimen below.  - Discussed with ortho who will re-evaluate patient today - Scheduled Tylenol  1 g 3 times daily, oxycodone  10mg  q6h, dilaudid  0.5mg  q3 prn. For bowel regimen, Senna-docusate BID, Miralax  BID. - Elevate leg, ice packs   Symptomatic anemia Hereditary hemorrhagic telangiectasia Goal to keep Hg above 8, at goal, s/p 5u PRBCs.  Given HHT, history of multiple bleeding events, including GI, but denies any now.  Bleeding primarily seems to have been secondary to epistaxis. Hb last night at 7.8, received a unit of blood. Pending post transfusion H+H - Monitor Hg, attempt to keep  above 8. Monitor for signs of bleeding. For recurrent nosebleeds, attempt control with cold packs and pressure. If persistent, consider ENT for hemostasis    Syncope due to acute on chronic blood loss anemia - Echo without valvular disease. Grade 2 diastolic dysfunction. - Holding home BP regimen for now, will monitor BPs   URI, cough   Not hypoxic.  Report subjective dyspnea and a new cough a few days ago.  Lung exam stable.  CXR negative a few days ago.  Treating with guaifenesin . Add dextromethorphan .   Acute kidney injury (resolved) Creatinine improved to 0.86, was as high as 1.18, baseline around 0.80. Likely prerenal. Intravascular repletion via blood per above.   Type 2 diabetes Last A1c 02/01/2023, 5.0.  She seems to be well-controlled for many years.  Plan: - Hold (discontinue altogether?) metformin  in setting of AKI (which is now resolved) - CBG   Tobacco use disorder Half pack a day smoker. Plan: - Will hold nicotine  patch in setting of possible surgery -No PFTs on file to formally diagnose COPD, resume home albuterol    GAD Resume PTA patient monitored trazodone , Remeron , Celexa    Best Practice: Diet: Cardiac diet IVF: None VTE: SCDs Start: 04/13/23 0620 Code: DNR/DNI AB: None DISPO: Anticipated discharge in 6 days to home vs SNF pending Surgery.  Signature: Mayzee Reichenbach, MD Internal Medicine Resident, PGY-2 [name]  Pager: [phone] 11:19 AM, 04/20/2023   Please contact the on call pager after 5 pm and on weekends at [phone].

## 2023-04-21 DIAGNOSIS — S82851D Displaced trimalleolar fracture of right lower leg, subsequent encounter for closed fracture with routine healing: Secondary | ICD-10-CM | POA: Diagnosis not present

## 2023-04-21 DIAGNOSIS — S82851A Displaced trimalleolar fracture of right lower leg, initial encounter for closed fracture: Secondary | ICD-10-CM | POA: Diagnosis not present

## 2023-04-21 DIAGNOSIS — I780 Hereditary hemorrhagic telangiectasia: Secondary | ICD-10-CM | POA: Diagnosis not present

## 2023-04-21 DIAGNOSIS — D649 Anemia, unspecified: Secondary | ICD-10-CM | POA: Diagnosis not present

## 2023-04-21 LAB — CBC
HCT: 27.5 % — ABNORMAL LOW (ref 36.0–46.0)
Hemoglobin: 8.1 g/dL — ABNORMAL LOW (ref 12.0–15.0)
MCH: 26.3 pg (ref 26.0–34.0)
MCHC: 29.5 g/dL — ABNORMAL LOW (ref 30.0–36.0)
MCV: 89.3 fL (ref 80.0–100.0)
Platelets: 282 10*3/uL (ref 150–400)
RBC: 3.08 MIL/uL — ABNORMAL LOW (ref 3.87–5.11)
RDW: 18.1 % — ABNORMAL HIGH (ref 11.5–15.5)
WBC: 9.8 10*3/uL (ref 4.0–10.5)
nRBC: 0 % (ref 0.0–0.2)

## 2023-04-21 LAB — PREPARE RBC (CROSSMATCH)

## 2023-04-21 LAB — GLUCOSE, CAPILLARY
Glucose-Capillary: 95 mg/dL (ref 70–99)
Glucose-Capillary: 122 mg/dL — ABNORMAL HIGH (ref 70–99)
Glucose-Capillary: 108 mg/dL — ABNORMAL HIGH (ref 70–99)
Glucose-Capillary: 137 mg/dL — ABNORMAL HIGH (ref 70–99)

## 2023-04-21 LAB — BASIC METABOLIC PANEL
Anion gap: 10 (ref 5–15)
BUN: 12 mg/dL (ref 8–23)
CO2: 24 mmol/L (ref 22–32)
Calcium: 8.5 mg/dL — ABNORMAL LOW (ref 8.9–10.3)
Chloride: 106 mmol/L (ref 98–111)
Creatinine, Ser: 0.88 mg/dL (ref 0.44–1.00)
GFR, Estimated: >60 mL/min (ref >60)
Glucose, Bld: 108 mg/dL — ABNORMAL HIGH (ref 70–99)
Potassium: 3.9 mmol/L (ref 3.5–5.1)
Sodium: 140 mmol/L (ref 135–145)

## 2023-04-21 LAB — HEMOGLOBIN AND HEMATOCRIT, BLOOD
HCT: 26.6 % — ABNORMAL LOW (ref 36.0–46.0)
HCT: 30 % — ABNORMAL LOW (ref 36.0–46.0)
Hemoglobin: 7.9 g/dL — ABNORMAL LOW (ref 12.0–15.0)
Hemoglobin: 9 g/dL — ABNORMAL LOW (ref 12.0–15.0)

## 2023-04-21 MED ORDER — DIPHENHYDRAMINE HCL 25 MG PO CAPS
25.0000 mg | ORAL_CAPSULE | Freq: Once | ORAL | Status: AC
  Administered 2023-04-21: 25 mg via ORAL
  Filled 2023-04-21: qty 1

## 2023-04-21 MED ORDER — SODIUM CHLORIDE 0.9% IV SOLUTION: Freq: Once | INTRAVENOUS | Status: DC

## 2023-04-21 NOTE — Progress Notes (Signed)
 Occupational Therapy Treatment Patient Details Name: Peggy House MRN: [id number] DOB: 1960/07/28 Today's Date: 04/21/2023   History of present illness Presented with syncopal episode and admitted for symptomatic anemia and R ankle fracture, external reduction performed 12/29, surgery planned in a few days  PMH includes: hereditary Hemorrhagic Telangiectasia, blood loss anemia, MDD, GAD, arthritis, HTN, obesity, DM2.   OT comments  Pt able to complete bed mobility with assist for R LE. Participated in grooming at EOB with set up and gown change with min assist. Educated in UE theraband exercises and instructed to do 3x per day. Pt declined OOB. Began educating pt in compensatory strategies for LB ADLs in sitting, leaning side to side as pt is unable to adhere to NWB on R LE. She reports she was to have a L TKA in the next month. Patient will benefit from continued inpatient follow up therapy, <3 hours/day.      If plan is discharge home, recommend the following:  Two people to help with walking and/or transfers;A lot of help with bathing/dressing/bathroom;Assistance with cooking/housework;Assist for transportation;Help with stairs or ramp for entrance   Equipment Recommendations  Wheelchair (measurements OT);Wheelchair cushion (measurements OT) (drop arm commode)    Recommendations for Other Services      Precautions / Restrictions Precautions Precautions: Fall Restrictions Weight Bearing Restrictions Per Provider Order: Yes RLE Weight Bearing Per Provider Order: Non weight bearing       Mobility Bed Mobility   Bed Mobility: Supine to Sit, Sit to Supine     Supine to sit: Used rails, Min assist Sit to supine: Used rails, HOB elevated, Min assist   General bed mobility comments: assist for RLE management only. Pt required increased time due to pain    Transfers Overall transfer level: Needs assistance                       Balance   Sitting-balance  support: Feet supported, Bilateral upper extremity supported Sitting balance-Leahy Scale: Fair Sitting balance - Comments: participates in ADLs at EOB without LOB                                   ADL either performed or assessed with clinical judgement   ADL Overall ADL's : Needs assistance/impaired     Grooming: Wash/dry hands;Wash/dry face;Sitting;Set up           Upper Body Dressing : Minimal assistance;Sitting                          Extremity/Trunk Assessment              Vision       Perception     Praxis      Cognition Arousal: Alert Behavior During Therapy: WFL for tasks assessed/performed Overall Cognitive Status: Within Functional Limits for tasks assessed                                          Exercises Exercises: General Upper Extremity General Exercises - Upper Extremity Shoulder Flexion: Strengthening, Both, 10 reps, Seated, Theraband Theraband Level (Shoulder Flexion): Level 3 (Green) Shoulder Horizontal ABduction: Strengthening, Both, 10 reps, Seated, Theraband Theraband Level (Shoulder Horizontal Abduction): Level 3 (Green) Elbow Flexion: Strengthening, Both, 10 reps, Seated, Theraband Theraband  Level (Elbow Flexion): Level 3 (Green) Elbow Extension: Strengthening, Both, 10 reps, Seated, Theraband Theraband Level (Elbow Extension): Level 3 (Green)    Shoulder Instructions       General Comments      Pertinent Vitals/ Pain       Pain Assessment Pain Assessment: Faces Faces Pain Scale: Hurts little more Pain Location: R ankle Pain Descriptors / Indicators: Aching, Grimacing, Tingling Pain Intervention(s): Repositioned  Home Living                                          Prior Functioning/Environment              Frequency  Min 1X/week        Progress Toward Goals  OT Goals(current goals can now be found in the care plan section)  Progress towards OT  goals: Progressing toward goals  Acute Rehab OT Goals OT Goal Formulation: With patient Time For Goal Achievement: 04/29/23 Potential to Achieve Goals: Good  Plan      Co-evaluation                 AM-PAC OT 6 Clicks Daily Activity     Outcome Measure   Help from another person eating meals?: None Help from another person taking care of personal grooming?: A Little Help from another person toileting, which includes using toliet, bedpan, or urinal?: A Lot Help from another person bathing (including washing, rinsing, drying)?: A Lot Help from another person to put on and taking off regular upper body clothing?: A Little Help from another person to put on and taking off regular lower body clothing?: A Lot 6 Click Score: 16    End of Session    OT Visit Diagnosis: Pain;Muscle weakness (generalized) (M62.81);History of falling (Z91.81);Unsteadiness on feet (R26.81) Pain - Right/Left: Right Pain - part of body: Ankle and joints of foot   Activity Tolerance Patient tolerated treatment well   Patient Left in bed;with call bell/phone within reach;with bed alarm set;with nursing/sitter in room   Nurse Communication          Time: 9154-9141 OT Time Calculation (min): 13 min  Charges: OT General Charges $OT Visit: 1 Visit OT Treatments $Therapeutic Activity: 8-22 mins  Mliss HERO, OTR/L Acute Rehabilitation Services Office: [phone]   Kennth Mliss Helling 04/21/2023, 10:57 AM

## 2023-04-21 NOTE — Plan of Care (Signed)
  Problem: Coping: Goal: Ability to adjust to condition or change in health will improve Outcome: Progressing   Problem: Fluid Volume: Goal: Ability to maintain a balanced intake and output will improve Outcome: Progressing   Problem: Health Behavior/Discharge Planning: Goal: Ability to identify and utilize available resources and services will improve Outcome: Progressing Goal: Ability to manage health-related needs will improve Outcome: Progressing

## 2023-04-21 NOTE — Progress Notes (Addendum)
 HD#7 SUBJECTIVE:  Patient Summary: Peggy House is a 63 y.o. with a pertinent PMH of hereditary hemorrhagic telangiectasia, chronic anemia, hypertension, hyperlipidemia, GI bleed, and type 2 diabetes , who presented with syncopal episode and admitted for symptomatic anemia and R ankle fracture.    Overnight Events: None   Interim History: Pt seen bedside this morning. States she is doing well, and does not have any acute complaints. RN shared of a nosebleed after rounds that filled one small cup but subsided with pressure.  OBJECTIVE:  Vital Signs: Vitals:   04/20/23 1621 04/20/23 2025 04/21/23 0511 04/21/23 0726  BP: (!) 154/62 (!) 147/63 134/62 136/68  Pulse: (!) 50 (!) 57 (!) 53 60  Resp: 18 18 18 18   Temp: 98.2 F (36.8 C) 99.1 F (37.3 C) 98 F (36.7 C) 98.6 F (37 C)  TempSrc: Oral     SpO2: 99% 100% 95% 100%  Weight:      Height:       Supplemental O2: Room Air SpO2: 100 %  Filed Weights   04/13/23 0313  Weight: 104.1 kg     Intake/Output Summary (Last 24 hours) at 04/21/2023 1319 Last data filed at 04/21/2023 0443 Gross per 24 hour  Intake 480 ml  Output 950 ml  Net -470 ml   Net IO Since Admission: -2,526.08 mL [04/21/23 1319]  Physical Exam: Physical Exam Constitutional:      Appearance: She is not ill-appearing.  Cardiovascular:     Rate and Rhythm: Normal rate.  Pulmonary:     Effort: Pulmonary effort is normal. CTABL. Auscultated front and back. Abdominal:     General: Abdomen is flat.  Musculoskeletal:     Comments: R ankle in splint. Swelling beneath bandages continues to improve. The splint is intact and snug. R leg/foot is neurovascularly inact. Toes are warm and able to move.   Skin:    General: Skin is warm.     Capillary Refill: Capillary refill takes less than 2 seconds.  Neurological:     Mental Status: She is alert.  Psychiatric:        Mood and Affect: Mood normal.        Behavior: Behavior normal.   Patient  Lines/Drains/Airways Status     Active Line/Drains/Airways     Name Placement date Placement time Site Days   Implanted Port 07/08/18 Right Chest 07/08/18  --  Chest  1748   Peripheral IV 04/15/23 20 G Anterior;Right Wrist 04/15/23  1749  Wrist  6             ASSESSMENT/PLAN:  Assessment: Principal Problem:   Closed right trimalleolar fracture Active Problems:   Acute on chronic blood loss anemia   Anxiety   Essential hypertension   Obesity, Class III, BMI 40-49.9 (morbid obesity) (HCC)   Cough   Type 2 diabetes mellitus with vascular disease (HCC)   Syncope   HHT (hereditary hemorrhagic telangiectasia) (HCC)   Thrombocytopenia (HCC)  Peggy House is a 63 y.o. woman with a PMH of hereditary hemorrhagic telangiectasia, chronic iron  deficiency anemia, type 2 diabetes, tobacco use disorder, GAD who presented following a syncopal episode and admitted for symptomatic anemia and R ankle fracture.   She is medically stable. We are monitoring Hg and pain in anticipation of surgery for R ankle fracture. This is to happen on 04/23/23.   Plan: Trimalleolar R Ankle Fracture Ankle fracture due to fall/syncopy. Orthopedics involved. Closed reduction Sunday 12/29. Plan for surgery on Wed,  plan NPO midnight before. Pain is well controlled with outlined pain regimen below.  - Ortho to do surgery on Wednesday 1/8 - Scheduled Tylenol  1 g 3 times daily, oxycodone  10mg  q12h, dilaudid  1mg  q6 prn. For bowel regimen, Senna-docusate BID, Miralax  BID. - Elevate leg, ice packs   Symptomatic anemia Hereditary hemorrhagic telangiectasia Goal to keep Hg above 8, at goal, s/p 5u PRBCs.  Given HHT, history of multiple bleeding events, including GI, but denies any now.  Bleeding primarily is epistaxis. Had a bleed today, controlled with 15 mins of pressure. Pending pm H+H as will likely need blood today. - Monitor Hg, attempt to keep above 8. Monitor for signs of bleeding. For recurrent  nosebleeds, attempt control with cold packs and pressure. Silver  nitrate can be tried. If persistent, consider ENT for hemostasis    Syncope due to acute on chronic blood loss anemia - Echo without valvular disease. Grade 2 diastolic dysfunction. - Holding home BP regimen for now, will monitor BPs   URI, cough   Not hypoxic.  Report subjective dyspnea and a new cough a few days ago.  Lung exam stable.  CXR negative a few days ago.  Treating with guaifenesin  + dextromethorphan .   Acute kidney injury (resolved) Creatinine improved to 0.86, was as high as 1.18, baseline around 0.80. Likely prerenal. Intravascular repletion via blood per above.   Type 2 diabetes Last A1c 02/01/2023, 5.0.  She seems to be well-controlled for many years.  Plan: - Hold (discontinue altogether?) metformin  in setting of AKI (which is now resolved) - CBG   Tobacco use disorder Half pack a day smoker. Plan: - Will hold nicotine  patch in setting of possible surgery -No PFTs on file to formally diagnose COPD, resume home albuterol    GAD Resume PTA patient monitored trazodone , Remeron , Celexa    Best Practice: Diet: Cardiac diet IVF: None VTE: SCDs Start: 04/13/23 0620 Code: DNR/DNI AB: None DISPO: Anticipated discharge in 4 days to home vs SNF pending Surgery.  Signature: Lonni Africa, D.O.  Internal Medicine Resident, PGY-1 [name]  Pager: [phone] 1:19 PM, 04/21/2023   Please contact the on call pager after 5 pm and on weekends at [phone].

## 2023-04-22 DIAGNOSIS — S82851D Displaced trimalleolar fracture of right lower leg, subsequent encounter for closed fracture with routine healing: Secondary | ICD-10-CM | POA: Diagnosis not present

## 2023-04-22 DIAGNOSIS — S82851A Displaced trimalleolar fracture of right lower leg, initial encounter for closed fracture: Secondary | ICD-10-CM | POA: Diagnosis not present

## 2023-04-22 DIAGNOSIS — I780 Hereditary hemorrhagic telangiectasia: Secondary | ICD-10-CM | POA: Diagnosis not present

## 2023-04-22 DIAGNOSIS — D649 Anemia, unspecified: Secondary | ICD-10-CM | POA: Diagnosis not present

## 2023-04-22 LAB — RENAL FUNCTION PANEL
Albumin: 2.5 g/dL — ABNORMAL LOW (ref 3.5–5.0)
Anion gap: 10 (ref 5–15)
BUN: 12 mg/dL (ref 8–23)
CO2: 24 mmol/L (ref 22–32)
Calcium: 8.4 mg/dL — ABNORMAL LOW (ref 8.9–10.3)
Chloride: 106 mmol/L (ref 98–111)
Creatinine, Ser: 0.98 mg/dL (ref 0.44–1.00)
GFR, Estimated: >60 mL/min (ref >60)
Glucose, Bld: 109 mg/dL — ABNORMAL HIGH (ref 70–99)
Phosphorus: 3.9 mg/dL (ref 2.5–4.6)
Potassium: 4.1 mmol/L (ref 3.5–5.1)
Sodium: 140 mmol/L (ref 135–145)

## 2023-04-22 LAB — BPAM RBC
Blood Product Expiration Date: 202501112359
Blood Product Expiration Date: 202501132359
ISSUE DATE / TIME: 202501050536
ISSUE DATE / TIME: 202501061643
Unit Type and Rh: 600
Unit Type and Rh: 600

## 2023-04-22 LAB — TYPE AND SCREEN
ABO/RH(D): A NEG
Antibody Screen: NEGATIVE
Unit division: 0
Unit division: 0

## 2023-04-22 LAB — CBC
HCT: 28.5 % — ABNORMAL LOW (ref 36.0–46.0)
Hemoglobin: 8.7 g/dL — ABNORMAL LOW (ref 12.0–15.0)
MCH: 27.2 pg (ref 26.0–34.0)
MCHC: 30.5 g/dL (ref 30.0–36.0)
MCV: 89.1 fL (ref 80.0–100.0)
Platelets: 329 10*3/uL (ref 150–400)
RBC: 3.2 MIL/uL — ABNORMAL LOW (ref 3.87–5.11)
RDW: 17.6 % — ABNORMAL HIGH (ref 11.5–15.5)
WBC: 9.5 10*3/uL (ref 4.0–10.5)
nRBC: 0 % (ref 0.0–0.2)

## 2023-04-22 LAB — GLUCOSE, CAPILLARY
Glucose-Capillary: 100 mg/dL — ABNORMAL HIGH (ref 70–99)
Glucose-Capillary: 117 mg/dL — ABNORMAL HIGH (ref 70–99)
Glucose-Capillary: 131 mg/dL — ABNORMAL HIGH (ref 70–99)
Glucose-Capillary: 94 mg/dL (ref 70–99)

## 2023-04-22 LAB — HEMOGLOBIN AND HEMATOCRIT, BLOOD
HCT: 31.3 % — ABNORMAL LOW (ref 36.0–46.0)
Hemoglobin: 9.2 g/dL — ABNORMAL LOW (ref 12.0–15.0)

## 2023-04-22 MED ORDER — BOOST PLUS PO LIQD
237.0000 mL | Freq: Three times a day (TID) | ORAL | Status: DC
  Administered 2023-04-22 – 2023-04-28 (×11): 237 mL via ORAL
  Filled 2023-04-22 (×19): qty 237

## 2023-04-22 MED ORDER — HYDROMORPHONE HCL 1 MG/ML IJ SOLN
0.5000 mg | Freq: Once | INTRAMUSCULAR | Status: AC
  Administered 2023-04-22: 0.5 mg via INTRAVENOUS
  Filled 2023-04-22: qty 0.5

## 2023-04-22 MED ORDER — DM-GUAIFENESIN ER 30-600 MG PO TB12: 2.0000 | ORAL_TABLET | Freq: Two times a day (BID) | ORAL | Status: DC | PRN

## 2023-04-22 MED ORDER — HYDROMORPHONE HCL 1 MG/ML IJ SOLN
1.0000 mg | Freq: Once | INTRAMUSCULAR | Status: AC
  Administered 2023-04-22: 1 mg via INTRAVENOUS
  Filled 2023-04-22: qty 1

## 2023-04-22 MED ORDER — HYDROMORPHONE HCL 1 MG/ML IJ SOLN: 1.0000 mg | Freq: Four times a day (QID) | INTRAMUSCULAR | Status: DC | PRN

## 2023-04-22 MED ORDER — OXYCODONE HCL 5 MG PO TABS
5.0000 mg | ORAL_TABLET | Freq: Four times a day (QID) | ORAL | Status: DC | PRN
  Administered 2023-04-22 – 2023-04-24 (×6): 5 mg via ORAL
  Filled 2023-04-22 (×6): qty 1

## 2023-04-22 NOTE — Plan of Care (Signed)
  Problem: Health Behavior/Discharge Planning: Goal: Ability to manage health-related needs will improve Outcome: Progressing   Problem: Nutritional: Goal: Maintenance of adequate nutrition will improve Outcome: Progressing   Problem: Nutritional: Goal: Progress toward achieving an optimal weight will improve Outcome: Progressing

## 2023-04-22 NOTE — Progress Notes (Addendum)
 HD#8 SUBJECTIVE:  Patient Summary: Peggy House is a 63 y.o. with a pertinent PMH of hereditary hemorrhagic telangiectasia, chronic anemia, hypertension, hyperlipidemia, GI bleed, and type 2 diabetes , who presented with syncopal episode and admitted for symptomatic anemia and R ankle fracture.    Overnight Events: None   Interim History: Pt seen bedside this morning. States she is doing well, and does not have any acute complaints. She had a nosebleed yesterday that filled one cup and resolved with 15 minutes of pressure and then was given one unit of PRBCs.  OBJECTIVE:  Vital Signs: Vitals:   04/21/23 1745 04/21/23 1939 04/22/23 0314 04/22/23 0850  BP: (!) 152/63 137/60 (!) 105/59 (!) 138/52  Pulse: (!) 58 62 (!) 59 (!) 56  Resp: 20 18 18    Temp: 98.9 F (37.2 C) 98.9 F (37.2 C) 98.4 F (36.9 C) 98.3 F (36.8 C)  TempSrc: Oral Oral Oral Oral  SpO2: 100% 99% 100% 99%  Weight:      Height:       Supplemental O2: Room Air SpO2: 99 %  Filed Weights   04/13/23 0313  Weight: 104.1 kg     Intake/Output Summary (Last 24 hours) at 04/22/2023 1332 Last data filed at 04/22/2023 0316 Gross per 24 hour  Intake 348.75 ml  Output 850 ml  Net -501.25 ml   Net IO Since Admission: -3,027.33 mL [04/22/23 1332]  Physical Exam: Physical Exam Constitutional:      Appearance: She is not ill-appearing.  Cardiovascular:     Rate and Rhythm: Normal rate.  Pulmonary:     Effort: Pulmonary effort is normal. CTABL. Auscultated front and back. Abdominal:     General: Abdomen is flat.  Musculoskeletal:     Comments: R ankle in splint. Swelling beneath bandages continues to improve. The splint is intact and snug. R leg/foot is neurovascularly inact. Toes are warm and able to move.   Skin:    General: Skin is warm.     Capillary Refill: Capillary refill takes less than 2 seconds.  Neurological:     Mental Status: She is alert.  Psychiatric:        Mood and Affect: Mood  normal.        Behavior: Behavior normal.   Patient Lines/Drains/Airways Status     Active Line/Drains/Airways     Name Placement date Placement time Site Days   Implanted Port 07/08/18 Right Chest 07/08/18  --  Chest  1749   Pressure Injury 04/22/23 Buttocks Right;Left Stage 2 -  Partial thickness loss of dermis presenting as a shallow open injury with a red, pink wound bed without slough.  04/22/23  1000  -- less than 1             ASSESSMENT/PLAN:  Assessment: Principal Problem:   Closed right trimalleolar fracture Active Problems:   Acute on chronic blood loss anemia   Anxiety   Essential hypertension   Obesity, Class III, BMI 40-49.9 (morbid obesity) (HCC)   Cough   Type 2 diabetes mellitus with vascular disease (HCC)   Syncope   HHT (hereditary hemorrhagic telangiectasia) (HCC)   Thrombocytopenia (HCC)  Peggy House is a 63 y.o. woman with a PMH of hereditary hemorrhagic telangiectasia, chronic iron  deficiency anemia, type 2 diabetes, tobacco use disorder, GAD who presented following a syncopal episode and admitted for symptomatic anemia and R ankle fracture.   She is medically stable. We are monitoring Hg and pain in anticipation of surgery for  R ankle fracture. This is to happen on 04/23/23.   Plan: Trimalleolar R Ankle Fracture Ankle fracture due to fall/syncope. Orthopedics involved. Closed reduction Sunday 12/29. Plan for surgery on Wed, plan NPO midnight. Pain is well controlled with outlined pain regimen below.  - Ortho to do surgery on Wednesday 1/8 - Scheduled Tylenol  1 g 3 times daily, oxycodone  5mg  q6h prn. For bowel regimen, Senna-docusate BID, Miralax  BID. Last BM 1/6. - Elevate leg, ice packs   Symptomatic anemia Hereditary hemorrhagic telangiectasia Goal to keep Hg above 8, at goal, s/p 6u PRBCs.  Given HHT, history of multiple bleeding events, including GI, but denies any now.  Bleeding primarily is epistaxis. Had a bleed yesterday,  controlled with 15 mins of pressure. Pending pm H+H - Monitor Hg, attempt to keep above 8. Monitor for signs of bleeding. For recurrent nosebleeds, attempt control with cold packs and pressure. Silver  nitrate can be tried if source of bleeding evident. If persistent, consider ENT for hemostasis.    Syncope due to acute on chronic blood loss anemia - Echo without valvular disease. Grade 2 diastolic dysfunction. - Holding home BP regimen for now, will monitor BPs   URI, cough   Not hypoxic.  Report subjective dyspnea and a new cough a few days ago.  Lung exam stable.  CXR negative a few days ago.  Treating with guaifenesin  + dextromethorphan  prn.   Type 2 diabetes Last A1c 02/01/2023, 5.0.  She seems to be well-controlled for many years.  Plan: - Hold (discontinue altogether?) metformin  in setting of AKI (which is now resolved) - CBG   Tobacco use disorder Half pack a day smoker. Plan: - Will hold nicotine  patch in setting of possible surgery -No PFTs on file to formally diagnose COPD, resume home albuterol    GAD Resume PTA patient monitored trazodone , Remeron , Celexa    Acute kidney injury (resolved) Resolved with oral rehydration. Prerenal.  Best Practice: Diet: Cardiac diet IVF: None VTE: SCDs Start: 04/13/23 0620 Code: DNR/DNI AB: None DISPO: Anticipated discharge in 4 days to home vs SNF pending Surgery.  Signature: Lonni Africa, D.O.  Internal Medicine Resident, PGY-1 [name]  Pager: [phone] 1:32 PM, 04/22/2023   Please contact the on call pager after 5 pm and on weekends at [phone].

## 2023-04-22 NOTE — Progress Notes (Signed)
 Initial Nutrition Assessment  DOCUMENTATION CODES:   Morbid obesity  INTERVENTION:  Liberalize diet D/C ensure Provide Boost Plus po TID, each supplement provides 360 kcal and 14 grams of protein    NUTRITION DIAGNOSIS:   Increased nutrient needs related to chronic illness as evidenced by estimated needs.    GOAL:   Patient will meet greater than or equal to 90% of their needs    MONITOR:   PO intake  REASON FOR ASSESSMENT:   Malnutrition Screening Tool    ASSESSMENT:  63 y.o. f, presented form home with complaints of syncopal episode. Admitted with symptomatic anemia, closed right trimalleolar fracture. PMH: hereditary hemorrhagic telangiectasia, chronic anemia, HTN, HLD, GI bleed, DM2, GERD, MDD. RN reported that her appetite has been poor mostly ordering fresh fruit. Refused Ensure, RN provided Glucerna, although reports that it has been setting on stand since early morning.    Patient lying in bed speaking with her son on the phone. She was eating grilled cheese sandwich. Stated that her appetite has declined over the last few years, after losing her husband in a pedestrian vs motor vehicle accident and her mother in the same time frame. She also reports that she receives chemo treatment 1 x week for her HHT although further states it has been a couple of weeks since her last treatment. Reported UBW was 237# but that has been approximately 2 years ago. Stated Ensure makes her stomach upset.  She is ok with Glucerna and willing to try Boost chocolate.   There is no benefit to excessive dietary restrictions related advanced age, inadequate oral intake . Patient would benefit better from liberalized diet to help  promote better oral intake.   Admit weight: 104.1 kg Weight history:  04/13/23 104.1 kg  04/04/23 104.1 kg  03/20/23 107.7 kg  03/18/23 101.6 kg  03/07/23 111.1 kg  02/21/23 108 kg  02/06/23 106.6 kg  01/31/23 106.5 kg  01/31/23 105.6 kg  12/12/22 101.2  kg    Average Meal Intake: No current documentation  Nutritionally Relevant Medications: Scheduled Meds:  aminocaproic  acid  1 g Oral Q12H   citalopram   20 mg Oral Daily   feeding supplement  237 mL Oral BID BM   oxyCODONE   10 mg Oral Q12H   senna-docusate  1 tablet Oral BID    PRN Meds:.albuterol , oxyCODONE , traZODone , trimethobenzamide   Labs Reviewed:  CBG ranges from 108-109 mg/dL over the last 24 hours     NUTRITION - FOCUSED PHYSICAL EXAM:  Flowsheet Row Most Recent Value  Orbital Region No depletion  Upper Arm Region No depletion  Thoracic and Lumbar Region No depletion  Buccal Region No depletion  Temple Region No depletion  Clavicle Bone Region No depletion  Clavicle and Acromion Bone Region No depletion  Scapular Bone Region No depletion  Dorsal Hand No depletion  Patellar Region No depletion  Anterior Thigh Region No depletion  Posterior Calf Region No depletion  Edema (RD Assessment) Mild  Hair Reviewed  Eyes Reviewed  Mouth Reviewed  Skin Reviewed  Nails Reviewed       Diet Order:   Diet Order             Diet regular Room service appropriate? Yes; Fluid consistency: Thin  Diet effective now                   EDUCATION NEEDS:   No education needs have been identified at this time  Skin:  Skin Assessment: Skin Integrity Issues:  Skin Integrity Issues:: Stage II Stage II: left buttocks  Last BM:  1/7 type 6  Height:   Ht Readings from Last 1 Encounters:  04/13/23 5' 3 (1.6 m)    Weight:   Wt Readings from Last 1 Encounters:  04/13/23 104.1 kg    Ideal Body Weight:     BMI:  Body mass index is 40.65 kg/m.  Estimated Nutritional Needs:   Kcal:  1575-1830 kcal  Protein:  70-80 g  Fluid:  75ml/kcal    Jenna Pew RDN, LDN Clinical Dietitian   If unable to reach, please contact RD Inpatient secure chat group between 8 am-4 pm daily

## 2023-04-22 NOTE — Plan of Care (Signed)

## 2023-04-22 NOTE — Progress Notes (Signed)
 Physical Therapy Treatment Patient Details Name: Peggy House MRN: [id number] DOB: December 03, 1960 Today's Date: 04/22/2023   History of Present Illness 63 y.o. female Presented with syncopal episode and admitted for symptomatic anemia and R ankle fracture, external reduction performed 12/29, surgery planned in a few days  PMH includes: hereditary Hemorrhagic Telangiectasia, blood loss anemia, MDD, GAD, arthritis, HTN, obesity, DM2.    PT Comments  Pt tolerates treatment well, focusing on transfer training. Pt continues to lack sufficient power to stand with RW while maintaining NWB through RLE, requiring significant assist from PT to ascend into an upright position. Pt does scoot laterally from bed to drop arm recliner well. PT will continue to work on lateral scoot transfers between surfaces of varying height, along with  LE strengthening to improve independence in transfers. Patient will benefit from continued inpatient follow up therapy, <3 hours/day.    If plan is discharge home, recommend the following: A lot of help with bathing/dressing/bathroom;Two people to help with walking and/or transfers;Help with stairs or ramp for entrance;Assist for transportation   Can travel by private vehicle     No  Equipment Recommendations   (TBD pending post-acute venue and after surgery)    Recommendations for Other Services       Precautions / Restrictions Precautions Precautions: Fall Restrictions Weight Bearing Restrictions Per Provider Order: Yes RLE Weight Bearing Per Provider Order: Non weight bearing     Mobility  Bed Mobility Overal bed mobility: Needs Assistance Bed Mobility: Rolling, Sidelying to Sit Rolling: Supervision Sidelying to sit: Contact guard assist, HOB elevated, Used rails            Transfers Overall transfer level: Needs assistance Equipment used: Rolling walker (2 wheels) Transfers: Sit to/from Stand, Bed to chair/wheelchair/BSC Sit to Stand: Mod  assist, From elevated surface          Lateral/Scoot Transfers: Contact guard assist General transfer comment: pt performs sit to stand, requiring verbal cues for hand placement and increased trunk flexion to promote anterior weight shift. Pt later performs lateral scoot transfer from bed to drop arm recliner, and finally stands once in STEDY. Pt is unable to maintain NWB through RLE for stand in STEDY as STEDY knee block prevents pt from fully extending RLE    Ambulation/Gait                   Stairs             Wheelchair Mobility     Tilt Bed    Modified Rankin (Stroke Patients Only)       Balance Overall balance assessment: Needs assistance Sitting-balance support: No upper extremity supported, Feet supported Sitting balance-Leahy Scale: Good     Standing balance support: Bilateral upper extremity supported, Reliant on assistive device for balance Standing balance-Leahy Scale: Poor                              Cognition Arousal: Alert Behavior During Therapy: WFL for tasks assessed/performed Overall Cognitive Status: Within Functional Limits for tasks assessed                                          Exercises      General Comments General comments (skin integrity, edema, etc.): VSS on RA      Pertinent Vitals/Pain Pain Assessment Pain  Assessment: No/denies pain    Home Living                          Prior Function            PT Goals (current goals can now be found in the care plan section) Acute Rehab PT Goals Patient Stated Goal: to get stronger Progress towards PT goals: Progressing toward goals    Frequency    Min 1X/week      PT Plan      Co-evaluation              AM-PAC PT 6 Clicks Mobility   Outcome Measure  Help needed turning from your back to your side while in a flat bed without using bedrails?: A Little Help needed moving from lying on your back to sitting on  the side of a flat bed without using bedrails?: A Little Help needed moving to and from a bed to a chair (including a wheelchair)?: A Little Help needed standing up from a chair using your arms (e.g., wheelchair or bedside chair)?: A Lot Help needed to walk in hospital room?: Total Help needed climbing 3-5 steps with a railing? : Total 6 Click Score: 13    End of Session Equipment Utilized During Treatment: Gait belt Activity Tolerance: Patient tolerated treatment well Patient left: in chair;with call bell/phone within reach;with chair alarm set Nurse Communication: Mobility status;Need for lift equipment (PT encouragesd performance of lateral scoot transfer back to bed, use of STEDY would be an option also although pt does apply some pressure through RLE to perform this transfer) PT Visit Diagnosis: Unsteadiness on feet (R26.81);Other abnormalities of gait and mobility (R26.89);Muscle weakness (generalized) (M62.81);Pain Pain - Right/Left: Right Pain - part of body: Leg;Ankle and joints of foot     Time: 1425-1500 PT Time Calculation (min) (ACUTE ONLY): 35 min  Charges:    $Therapeutic Activity: 23-37 mins PT General Charges $$ ACUTE PT VISIT: 1 Visit                     Bernardino JINNY Ruth, PT, DPT Acute Rehabilitation Office [phone]    Bernardino JINNY Ruth 04/22/2023, 3:21 PM

## 2023-04-23 DIAGNOSIS — D649 Anemia, unspecified: Secondary | ICD-10-CM | POA: Diagnosis not present

## 2023-04-23 DIAGNOSIS — S82851D Displaced trimalleolar fracture of right lower leg, subsequent encounter for closed fracture with routine healing: Secondary | ICD-10-CM | POA: Diagnosis not present

## 2023-04-23 DIAGNOSIS — I10 Essential (primary) hypertension: Secondary | ICD-10-CM | POA: Diagnosis not present

## 2023-04-23 LAB — CBC
HCT: 27.5 % — ABNORMAL LOW (ref 36.0–46.0)
Hemoglobin: 8.2 g/dL — ABNORMAL LOW (ref 12.0–15.0)
MCH: 26.5 pg (ref 26.0–34.0)
MCHC: 29.8 g/dL — ABNORMAL LOW (ref 30.0–36.0)
MCV: 89 fL (ref 80.0–100.0)
Platelets: 337 10*3/uL (ref 150–400)
RBC: 3.09 MIL/uL — ABNORMAL LOW (ref 3.87–5.11)
RDW: 17.6 % — ABNORMAL HIGH (ref 11.5–15.5)
WBC: 9.3 10*3/uL (ref 4.0–10.5)
nRBC: 0 % (ref 0.0–0.2)

## 2023-04-23 LAB — TYPE AND SCREEN
ABO/RH(D): A NEG
Antibody Screen: NEGATIVE

## 2023-04-23 LAB — GLUCOSE, CAPILLARY
Glucose-Capillary: 106 mg/dL — ABNORMAL HIGH (ref 70–99)
Glucose-Capillary: 95 mg/dL (ref 70–99)
Glucose-Capillary: 114 mg/dL — ABNORMAL HIGH (ref 70–99)
Glucose-Capillary: 140 mg/dL — ABNORMAL HIGH (ref 70–99)

## 2023-04-23 LAB — RENAL FUNCTION PANEL
Albumin: 2.6 g/dL — ABNORMAL LOW (ref 3.5–5.0)
Anion gap: 10 (ref 5–15)
BUN: 19 mg/dL (ref 8–23)
CO2: 25 mmol/L (ref 22–32)
Calcium: 8.7 mg/dL — ABNORMAL LOW (ref 8.9–10.3)
Chloride: 105 mmol/L (ref 98–111)
Creatinine, Ser: 1.04 mg/dL — ABNORMAL HIGH (ref 0.44–1.00)
GFR, Estimated: >60 mL/min (ref >60)
Glucose, Bld: 114 mg/dL — ABNORMAL HIGH (ref 70–99)
Phosphorus: 3.8 mg/dL (ref 2.5–4.6)
Potassium: 4 mmol/L (ref 3.5–5.1)
Sodium: 140 mmol/L (ref 135–145)

## 2023-04-23 MED ORDER — HYDROMORPHONE HCL 1 MG/ML IJ SOLN
0.5000 mg | Freq: Once | INTRAMUSCULAR | Status: AC
  Administered 2023-04-23: 0.5 mg via INTRAVENOUS
  Filled 2023-04-23: qty 0.5

## 2023-04-23 NOTE — Progress Notes (Signed)
 HD#9 SUBJECTIVE:  Patient Summary: Peggy House is a 63 y.o. with a pertinent PMH of hereditary hemorrhagic telangiectasia, chronic anemia, hypertension, hyperlipidemia, GI bleed, and type 2 diabetes , who presented with syncopal episode and admitted for symptomatic anemia and R ankle fracture.    Overnight Events: None  Interim History: Seen at bedside this morning.  She reports nerves regarding her upcoming surgery.  She does agree that it is the best thing for her though.  Has acute concerns.  Pain is manageable.  Breathing is stable.  She is getting over her URI but slowly.  OBJECTIVE:  Vital Signs: Vitals:   04/22/23 0850 04/22/23 1722 04/22/23 1934 04/23/23 0444  BP: (!) 138/52 118/68 (!) 165/72 (!) 169/61  Pulse: (!) 56 (!) 59 (!) 49 (!) 58  Resp:   18 18  Temp: 98.3 F (36.8 C) 98 F (36.7 C) 98.2 F (36.8 C) 98.5 F (36.9 C)  TempSrc: Oral Oral Oral Oral  SpO2: 99% 98% 100% 97%  Weight:      Height:       Supplemental O2: Room Air SpO2: 97 %  Filed Weights   04/13/23 0313  Weight: 104.1 kg     Intake/Output Summary (Last 24 hours) at 04/23/2023 0715 Last data filed at 04/22/2023 1254 Gross per 24 hour  Intake --  Output 300 ml  Net -300 ml   Net IO Since Admission: -3,327.33 mL [04/23/23 0715]  Physical Exam: Physical Exam Constitutional:      Appearance: She is not ill-appearing.  Cardiovascular:     Rate and Rhythm: Normal rate.  Pulmonary:     Effort: Pulmonary effort is normal. CTABL. Auscultated front and back. Abdominal:     General: Abdomen is flat.  Musculoskeletal:     Comments: R ankle in splint. Swelling beneath bandages continues to improve. The splint is intact and snug. R leg/foot is neurovascularly inact. Toes are warm and able to move.   Skin:    General: Skin is warm.     Capillary Refill: Capillary refill takes less than 2 seconds.  Neurological:     Mental Status: She is alert.  Psychiatric:        Mood and Affect:  Mood normal.        Behavior: Behavior normal.   Patient Lines/Drains/Airways Status     Active Line/Drains/Airways     Name Placement date Placement time Site Days   Implanted Port 07/08/18 Right Chest 07/08/18  --  Chest  1750   External Urinary Catheter 04/22/23  1000  --  1   Pressure Injury 04/22/23 Buttocks Right;Left Stage 2 -  Partial thickness loss of dermis presenting as a shallow open injury with a red, pink wound bed without slough.  04/22/23  1000  -- 1             ASSESSMENT/PLAN:  Assessment: Principal Problem:   Closed right trimalleolar fracture Active Problems:   Acute on chronic blood loss anemia   Anxiety   Essential hypertension   Obesity, Class III, BMI 40-49.9 (morbid obesity) (HCC)   Cough   Type 2 diabetes mellitus with vascular disease (HCC)   Syncope   HHT (hereditary hemorrhagic telangiectasia) (HCC)   Thrombocytopenia (HCC)  Peggy House is a 63 y.o. woman with a PMH of hereditary hemorrhagic telangiectasia, chronic iron  deficiency anemia, type 2 diabetes, tobacco use disorder, GAD who presented following a syncopal episode and admitted for symptomatic anemia and R ankle fracture.  She is medically stable. We are monitoring Hg and pain in anticipation of surgery for R ankle fracture scheduled today, 04/23/23.   Plan: Trimalleolar R Ankle Fracture Ankle fracture due to fall/syncope. Orthopedics involved. Closed reduction Sunday 12/29. Plan for surgery today, she is NPO. Pain is well controlled with outlined pain regimen below.  - Surgery today, Wednesday 1/8 - Scheduled Tylenol  1 g 3 times daily, oxycodone  5mg  q6h prn. For bowel regimen, Senna-docusate BID, Miralax  BID. Last BM 1/6. - Elevate leg, ice packs   Symptomatic anemia Hereditary hemorrhagic telangiectasia Goal to keep Hg above 8, at goal, s/p 6u PRBCs sporadically over this hospitalization.  Given HHT, history of multiple bleeding events, including GI, but denies any now.   Bleeding primarily is epistaxis, twice since here controlled with pressure. As outpatient, she typically requires transfusions weekly and reportedly difficult to maintain Hg above 8. - Monitor Hg, attempt to keep above 8. Monitor for signs of bleeding. For recurrent nosebleeds, attempt control with cold packs and pressure. Silver  nitrate can be tried if source of bleeding evident. If persistent, consider ENT for hemostasis.    Syncope due to acute on chronic blood loss anemia (resolved) Most likely due to acute anemia at admission, Hg 5. - Echo without valvular disease. Grade 2 diastolic dysfunction. - Holding home BP regimen for now, will monitor BPs   URI, cough   Not hypoxic.  Report subjective dyspnea and a new cough a few days ago.  Lung exam stable.  CXR negative a few days ago.  Treating with guaifenesin  + dextromethorphan  prn.   Type 2 diabetes Last A1c 02/01/2023, 5.0.  She seems to be well-controlled for many years.  - Metformin  held at admission, can discontinue given low A1c - CBG   Tobacco use disorder Half pack a day smoker. -Will hold nicotine  patch in setting of possible surgery -No PFTs on file to formally diagnose COPD, resume home albuterol    GAD Resume PTA trazodone , Remeron , Celexa    Acute kidney injury (resolved) Resolved with oral rehydration. Prerenal.   Best Practice: Diet: Cardiac diet IVF: None VTE: SCDs Start: 04/13/23 0620 Code: DNR/DNI AB: None DISPO: Anticipated discharge in 4 days to home vs SNF pending Surgery.  Signature: Lonni Africa, D.O.  Internal Medicine Resident, PGY-1 [name]  Pager: [phone] 7:15 AM, 04/23/2023   Please contact the on call pager after 5 pm and on weekends at [phone].

## 2023-04-23 NOTE — Plan of Care (Signed)
   Problem: Coping: Goal: Ability to adjust to condition or change in health will improve Outcome: Progressing   Problem: Fluid Volume: Goal: Ability to maintain a balanced intake and output will improve Outcome: Progressing   Problem: Metabolic: Goal: Ability to maintain appropriate glucose levels will improve Outcome: Progressing

## 2023-04-23 NOTE — Care Plan (Addendum)
 63 year old female with a right ankle fracture. Her swelling has improved and is safe to proceed with open reduction internal fixation right ankle. Given the OR schedule we will plan for surgery tomorrow afternoon  The risks and benefit of surgery discussed with patient all questions answered.  These risks include but are not limited to infection, bleeding, damage to neurovascular structures, failure to heal, malunion, nonunion, prominent hardware, need for additional surgery, stiffness, need for assistive device and cardiopulmonary complications from anesthesia.  The patient also understands she is high risk given her diabetes and bleeding disorder.  The patient understands all this and wished to proceed with surgery  .Adine JAYSON Mon 04/23/2023, 2:13 PM Orthopaedic Surgery

## 2023-04-23 NOTE — Plan of Care (Signed)

## 2023-04-23 NOTE — Progress Notes (Signed)
 Occupational Therapy Treatment Patient Details Name: Peggy House MRN: [id number] DOB: 19-May-1960 Today's Date: 04/23/2023   History of present illness 63 y.o. female Presented with syncopal episode and admitted for symptomatic anemia and R ankle fracture, external reduction performed 12/29, surgery planned in a few days  PMH includes: hereditary Hemorrhagic Telangiectasia, blood loss anemia, MDD, GAD, arthritis, HTN, obesity, DM2.   OT comments  Pt in good spirits, eager to participate and improve, slightly anxious about surgery planned for tomorrow. Pt able to complete supine to sit on EOB with CGA, moving well. Pt able to complete UB ADLs sitting EOB with set up. Attempted one STS with RW mod A from elevated surface, Pt able to maintain RLE NWB precautions and stood up to 1 minute, scooted towards L with heel/toe scoot using RW for support. Pt assisted back to bed. Pt wants to focus on surgery tomorrow, will return after surgery to reassess needs.       If plan is discharge home, recommend the following:  Two people to help with walking and/or transfers;A lot of help with bathing/dressing/bathroom;Assistance with cooking/housework;Assist for transportation;Help with stairs or ramp for entrance   Equipment Recommendations  Wheelchair (measurements OT);Wheelchair cushion (measurements OT)    Recommendations for Other Services      Precautions / Restrictions Precautions Precautions: Fall Restrictions Weight Bearing Restrictions Per Provider Order: Yes RLE Weight Bearing Per Provider Order: Non weight bearing       Mobility Bed Mobility Overal bed mobility: Needs Assistance       Supine to sit: Contact guard, Used rails Sit to supine: Min assist, HOB elevated, Used rails   General bed mobility comments: CGA getting OOB, min A returning to bed    Transfers Overall transfer level: Needs assistance Equipment used: Rolling walker (2 wheels) Transfers: Sit to/from  Stand Sit to Stand: Mod assist, From elevated surface           General transfer comment: Pt able to stand at bedside using RW from elevated surface, able to stand up to 60 seconds while maintaining WB precautions to RLE. Pt able to heel/toe scoot towards left while standing.     Balance Overall balance assessment: Needs assistance Sitting-balance support: No upper extremity supported, Feet supported Sitting balance-Leahy Scale: Good     Standing balance support: Bilateral upper extremity supported, Reliant on assistive device for balance Standing balance-Leahy Scale: Poor Standing balance comment: heavy reliance on RW for BUE support. Altered balance strategies due to RLE NWB precautions                           ADL either performed or assessed with clinical judgement   ADL Overall ADL's : Needs assistance/impaired Eating/Feeding: Independent   Grooming: Wash/dry hands;Wash/dry face;Sitting;Set up   Upper Body Bathing: Set up;Sitting       Upper Body Dressing : Set up;Sitting                     General ADL Comments: able to sit EOB and perofrm UB ADLs as needed    Extremity/Trunk Assessment Upper Extremity Assessment Upper Extremity Assessment: Overall WFL for tasks assessed;LUE deficits/detail LUE Deficits / Details: hx of carpal tunnel surgery 2 years ago, still numb in palm, drops items often. Pt has difficulty with FM/opposition LUE Sensation: decreased light touch LUE Coordination: decreased fine motor            Vision  Perception     Praxis      Cognition Arousal: Alert Behavior During Therapy: WFL for tasks assessed/performed Overall Cognitive Status: Within Functional Limits for tasks assessed                                          Exercises      Shoulder Instructions       General Comments      Pertinent Vitals/ Pain       Pain Assessment Pain Assessment: Faces Faces Pain Scale: Hurts  little more Pain Location: R ankle Pain Descriptors / Indicators: Aching, Grimacing, Tingling Pain Intervention(s): Monitored during session  Home Living                                          Prior Functioning/Environment              Frequency  Min 1X/week        Progress Toward Goals  OT Goals(current goals can now be found in the care plan section)  Progress towards OT goals: Progressing toward goals  Acute Rehab OT Goals Patient Stated Goal: to improve strength OT Goal Formulation: With patient Time For Goal Achievement: 04/29/23 Potential to Achieve Goals: Good ADL Goals Pt Will Perform Lower Body Dressing: with min assist;with adaptive equipment;sitting/lateral leans Pt Will Transfer to Toilet: with contact guard assist;bedside commode;stand pivot transfer Pt Will Perform Toileting - Clothing Manipulation and hygiene: with set-up;with supervision;sitting/lateral leans Additional ADL Goal #1: Pt will be able to complete bed mobility with supervision to prepare for transfers/ADLs and maximize independence.  Plan      Co-evaluation                 AM-PAC OT 6 Clicks Daily Activity     Outcome Measure   Help from another person eating meals?: None Help from another person taking care of personal grooming?: A Little Help from another person toileting, which includes using toliet, bedpan, or urinal?: A Lot Help from another person bathing (including washing, rinsing, drying)?: A Lot Help from another person to put on and taking off regular upper body clothing?: A Little Help from another person to put on and taking off regular lower body clothing?: A Lot 6 Click Score: 16    End of Session Equipment Utilized During Treatment: Gait belt;Rolling walker (2 wheels)  OT Visit Diagnosis: Pain;Muscle weakness (generalized) (M62.81);History of falling (Z91.81);Unsteadiness on feet (R26.81) Pain - Right/Left: Right Pain - part of body:  Ankle and joints of foot   Activity Tolerance Patient tolerated treatment well   Patient Left in bed;with call bell/phone within reach;with nursing/sitter in room   Nurse Communication Mobility status        Time: 8566-8548 OT Time Calculation (min): 18 min  Charges: OT General Charges $OT Visit: 1 Visit OT Treatments $Therapeutic Activity: 8-22 mins  [address], OTR/L   [name] 04/23/2023, 3:01 PM

## 2023-04-24 ENCOUNTER — Inpatient Hospital Stay (HOSPITAL_COMMUNITY): Payer: Medicaid Other | Admitting: Certified Registered Nurse Anesthetist

## 2023-04-24 ENCOUNTER — Other Ambulatory Visit: Payer: Self-pay

## 2023-04-24 ENCOUNTER — Inpatient Hospital Stay (HOSPITAL_COMMUNITY): Payer: Medicaid Other

## 2023-04-24 ENCOUNTER — Encounter (HOSPITAL_COMMUNITY): Payer: Self-pay | Admitting: Internal Medicine

## 2023-04-24 ENCOUNTER — Encounter (HOSPITAL_COMMUNITY)
Admission: EM | Disposition: A | Discharge status: Rehabilitation Facility | Payer: Self-pay | Source: Home / Self Care | Attending: Internal Medicine

## 2023-04-24 DIAGNOSIS — S8251XA Displaced fracture of medial malleolus of right tibia, initial encounter for closed fracture: Secondary | ICD-10-CM | POA: Diagnosis not present

## 2023-04-24 DIAGNOSIS — S82301D Unspecified fracture of lower end of right tibia, subsequent encounter for closed fracture with routine healing: Secondary | ICD-10-CM | POA: Diagnosis not present

## 2023-04-24 DIAGNOSIS — S82401D Unspecified fracture of shaft of right fibula, subsequent encounter for closed fracture with routine healing: Secondary | ICD-10-CM | POA: Diagnosis not present

## 2023-04-24 DIAGNOSIS — E119 Type 2 diabetes mellitus without complications: Secondary | ICD-10-CM

## 2023-04-24 DIAGNOSIS — D649 Anemia, unspecified: Secondary | ICD-10-CM | POA: Diagnosis not present

## 2023-04-24 DIAGNOSIS — I10 Essential (primary) hypertension: Secondary | ICD-10-CM | POA: Diagnosis not present

## 2023-04-24 DIAGNOSIS — S82891A Other fracture of right lower leg, initial encounter for closed fracture: Secondary | ICD-10-CM

## 2023-04-24 DIAGNOSIS — S82401A Unspecified fracture of shaft of right fibula, initial encounter for closed fracture: Secondary | ICD-10-CM | POA: Diagnosis not present

## 2023-04-24 DIAGNOSIS — S82851D Displaced trimalleolar fracture of right lower leg, subsequent encounter for closed fracture with routine healing: Secondary | ICD-10-CM | POA: Diagnosis not present

## 2023-04-24 DIAGNOSIS — S82841A Displaced bimalleolar fracture of right lower leg, initial encounter for closed fracture: Secondary | ICD-10-CM | POA: Diagnosis not present

## 2023-04-24 DIAGNOSIS — F17210 Nicotine dependence, cigarettes, uncomplicated: Secondary | ICD-10-CM | POA: Diagnosis not present

## 2023-04-24 DIAGNOSIS — G8918 Other acute postprocedural pain: Secondary | ICD-10-CM | POA: Diagnosis not present

## 2023-04-24 HISTORY — PX: ORIF ANKLE FRACTURE: SHX5408

## 2023-04-24 LAB — CBC
HCT: 28.8 % — ABNORMAL LOW (ref 36.0–46.0)
Hemoglobin: 8.7 g/dL — ABNORMAL LOW (ref 12.0–15.0)
MCH: 26.9 pg (ref 26.0–34.0)
MCHC: 30.2 g/dL (ref 30.0–36.0)
MCV: 89.2 fL (ref 80.0–100.0)
Platelets: 420 10*3/uL — ABNORMAL HIGH (ref 150–400)
RBC: 3.23 MIL/uL — ABNORMAL LOW (ref 3.87–5.11)
RDW: 17.5 % — ABNORMAL HIGH (ref 11.5–15.5)
WBC: 10.5 10*3/uL (ref 4.0–10.5)
nRBC: 0 % (ref 0.0–0.2)

## 2023-04-24 LAB — GLUCOSE, CAPILLARY
Glucose-Capillary: 122 mg/dL — ABNORMAL HIGH (ref 70–99)
Glucose-Capillary: 98 mg/dL (ref 70–99)
Glucose-Capillary: 99 mg/dL (ref 70–99)
Glucose-Capillary: 108 mg/dL — ABNORMAL HIGH (ref 70–99)
Glucose-Capillary: 94 mg/dL (ref 70–99)
Glucose-Capillary: 95 mg/dL (ref 70–99)

## 2023-04-24 LAB — RENAL FUNCTION PANEL
Albumin: 2.7 g/dL — ABNORMAL LOW (ref 3.5–5.0)
Anion gap: 6 (ref 5–15)
BUN: 13 mg/dL (ref 8–23)
CO2: 24 mmol/L (ref 22–32)
Calcium: 8.5 mg/dL — ABNORMAL LOW (ref 8.9–10.3)
Chloride: 108 mmol/L (ref 98–111)
Creatinine, Ser: 0.97 mg/dL (ref 0.44–1.00)
GFR, Estimated: >60 mL/min (ref >60)
Glucose, Bld: 113 mg/dL — ABNORMAL HIGH (ref 70–99)
Phosphorus: 3.6 mg/dL (ref 2.5–4.6)
Potassium: 3.9 mmol/L (ref 3.5–5.1)
Sodium: 138 mmol/L (ref 135–145)

## 2023-04-24 LAB — SURGICAL PCR SCREEN
MRSA, PCR: NEGATIVE
Staphylococcus aureus: NEGATIVE

## 2023-04-24 SURGERY — OPEN REDUCTION INTERNAL FIXATION (ORIF) ANKLE FRACTURE
Anesthesia: General | Site: Ankle | Laterality: Right

## 2023-04-24 MED ORDER — FENTANYL CITRATE (PF) 250 MCG/5ML IJ SOLN
INTRAMUSCULAR | Status: AC
  Filled 2023-04-24: qty 5

## 2023-04-24 MED ORDER — OXYCODONE HCL 5 MG PO TABS
5.0000 mg | ORAL_TABLET | ORAL | Status: DC | PRN
  Administered 2023-04-24 – 2023-04-25 (×2): 5 mg via ORAL
  Filled 2023-04-24 (×2): qty 1

## 2023-04-24 MED ORDER — HYDROMORPHONE HCL 1 MG/ML IJ SOLN: 0.2500 mg | INTRAMUSCULAR | Status: DC | PRN

## 2023-04-24 MED ORDER — CEFAZOLIN SODIUM-DEXTROSE 2-4 GM/100ML-% IV SOLN
2.0000 g | Freq: Once | INTRAVENOUS | Status: AC
  Administered 2023-04-24: 2 g via INTRAVENOUS

## 2023-04-24 MED ORDER — CHLORHEXIDINE GLUCONATE 0.12 % MT SOLN
OROMUCOSAL | Status: AC
  Administered 2023-04-24: 15 mL
  Filled 2023-04-24: qty 15

## 2023-04-24 MED ORDER — ACETAMINOPHEN 500 MG PO TABS
ORAL_TABLET | ORAL | Status: AC
  Administered 2023-04-24: 1000 mg via ORAL
  Filled 2023-04-24: qty 2

## 2023-04-24 MED ORDER — FENTANYL CITRATE (PF) 100 MCG/2ML IJ SOLN: 100.0000 ug | Freq: Once | INTRAMUSCULAR | Status: AC

## 2023-04-24 MED ORDER — FENTANYL CITRATE (PF) 100 MCG/2ML IJ SOLN
INTRAMUSCULAR | Status: AC
  Administered 2023-04-24: 100 ug via INTRAVENOUS
  Filled 2023-04-24: qty 2

## 2023-04-24 MED ORDER — MIDAZOLAM HCL 2 MG/2ML IJ SOLN
INTRAMUSCULAR | Status: AC
  Filled 2023-04-24: qty 2

## 2023-04-24 MED ORDER — EPHEDRINE SULFATE-NACL 50-0.9 MG/10ML-% IV SOSY
PREFILLED_SYRINGE | INTRAVENOUS | Status: DC | PRN
  Administered 2023-04-24: 10 mg via INTRAVENOUS

## 2023-04-24 MED ORDER — BUPIVACAINE HCL (PF) 0.25 % IJ SOLN
INTRAMUSCULAR | Status: AC
  Filled 2023-04-24: qty 30

## 2023-04-24 MED ORDER — CEFAZOLIN SODIUM-DEXTROSE 2-4 GM/100ML-% IV SOLN
INTRAVENOUS | Status: AC
  Filled 2023-04-24: qty 100

## 2023-04-24 MED ORDER — LACTATED RINGERS IV SOLN: INTRAVENOUS | Status: DC | PRN

## 2023-04-24 MED ORDER — PROPOFOL 10 MG/ML IV BOLUS
INTRAVENOUS | Status: AC
Start: 2023-04-24 — End: ?
  Filled 2023-04-24: qty 20

## 2023-04-24 MED ORDER — BUPIVACAINE-EPINEPHRINE (PF) 0.5% -1:200000 IJ SOLN
INTRAMUSCULAR | Status: DC | PRN
  Administered 2023-04-24: 30 mL via PERINEURAL
  Administered 2023-04-24: 15 mL via PERINEURAL

## 2023-04-24 MED ORDER — DEXAMETHASONE SODIUM PHOSPHATE 10 MG/ML IJ SOLN
INTRAMUSCULAR | Status: DC | PRN
  Administered 2023-04-24: 5 mg via INTRAVENOUS

## 2023-04-24 MED ORDER — VANCOMYCIN HCL 1000 MG IV SOLR
INTRAVENOUS | Status: DC | PRN
  Administered 2023-04-24: 1000 mg

## 2023-04-24 MED ORDER — CEFAZOLIN SODIUM-DEXTROSE 2-4 GM/100ML-% IV SOLN
2.0000 g | Freq: Three times a day (TID) | INTRAVENOUS | Status: AC
  Administered 2023-04-25 (×3): 2 g via INTRAVENOUS
  Filled 2023-04-24 (×3): qty 100

## 2023-04-24 MED ORDER — MIDAZOLAM HCL 2 MG/2ML IJ SOLN
INTRAMUSCULAR | Status: DC | PRN
  Administered 2023-04-24: 2 mg via INTRAVENOUS

## 2023-04-24 MED ORDER — PHENYLEPHRINE 80 MCG/ML (10ML) SYRINGE FOR IV PUSH (FOR BLOOD PRESSURE SUPPORT)
PREFILLED_SYRINGE | INTRAVENOUS | Status: DC | PRN
  Administered 2023-04-24: 160 ug via INTRAVENOUS

## 2023-04-24 MED ORDER — FENTANYL CITRATE (PF) 250 MCG/5ML IJ SOLN
INTRAMUSCULAR | Status: DC | PRN
  Administered 2023-04-24 (×4): 25 ug via INTRAVENOUS

## 2023-04-24 MED ORDER — VANCOMYCIN HCL 1000 MG IV SOLR
INTRAVENOUS | Status: AC
  Filled 2023-04-24: qty 20

## 2023-04-24 MED ORDER — LIDOCAINE 2% (20 MG/ML) 5 ML SYRINGE
INTRAMUSCULAR | Status: DC | PRN
  Administered 2023-04-24: 40 mg via INTRAVENOUS

## 2023-04-24 MED ORDER — 0.9 % SODIUM CHLORIDE (POUR BTL) OPTIME
TOPICAL | Status: DC | PRN
  Administered 2023-04-24: 1000 mL

## 2023-04-24 MED ORDER — ONDANSETRON HCL 4 MG/2ML IJ SOLN
INTRAMUSCULAR | Status: DC | PRN
  Administered 2023-04-24: 4 mg via INTRAVENOUS

## 2023-04-24 MED ORDER — PROPOFOL 10 MG/ML IV BOLUS
INTRAVENOUS | Status: DC | PRN
  Administered 2023-04-24: 150 mg via INTRAVENOUS

## 2023-04-24 SURGICAL SUPPLY — 56 items
BIT DRILL 2.7 QC CANN 155 (BIT) IMPLANT
BIT DRILL QC 2.0 SHORT EVOS SM (DRILL) IMPLANT
BIT DRILL QC 2.5MM SHRT EVO SM (DRILL) IMPLANT
BNDG ELASTIC 4INX 5YD STR LF (GAUZE/BANDAGES/DRESSINGS) IMPLANT
BNDG ELASTIC 6INX 5YD STR LF (GAUZE/BANDAGES/DRESSINGS) IMPLANT
CANISTER SUCT 3000ML PPV (MISCELLANEOUS) ×2 IMPLANT
COVER SURGICAL LIGHT HANDLE (MISCELLANEOUS) ×2 IMPLANT
CUFF TOURN SGL QUICK 42 (TOURNIQUET CUFF) IMPLANT
DRAPE C-ARM 42X72 X-RAY (DRAPES) ×2 IMPLANT
DRAPE C-ARMOR (DRAPES) ×2 IMPLANT
DRAPE INCISE IOBAN 66X45 STRL (DRAPES) IMPLANT
DRAPE U-SHAPE 47X51 STRL (DRAPES) ×2 IMPLANT
DRILL QC 2.0 SHORT EVOS SM (DRILL) ×1
DRILL QC 2.5MM SHORT EVOS SM (DRILL) ×1
ELECT REM PT RETURN 9FT ADLT (ELECTROSURGICAL) ×1
ELECTRODE REM PT RTRN 9FT ADLT (ELECTROSURGICAL) ×2 IMPLANT
GAUZE PAD ABD 8X10 STRL (GAUZE/BANDAGES/DRESSINGS) IMPLANT
GAUZE SPONGE 4X4 12PLY STRL (GAUZE/BANDAGES/DRESSINGS) IMPLANT
GAUZE XEROFORM 5X9 LF (GAUZE/BANDAGES/DRESSINGS) IMPLANT
GLOVE BIO SURGEON STRL SZ7.5 (GLOVE) ×4 IMPLANT
GLOVE BIOGEL PI IND STRL 8 (GLOVE) ×4 IMPLANT
GOWN STRL REUS W/ TWL LRG LVL3 (GOWN DISPOSABLE) ×4 IMPLANT
GUIDE PIN 1.3 (PIN) ×3
KIT BASIN OR (CUSTOM PROCEDURE TRAY) ×2 IMPLANT
KIT TURNOVER KIT B (KITS) ×2 IMPLANT
NDL HYPO 25GX1X1/2 BEV (NEEDLE) IMPLANT
NEEDLE HYPO 25GX1X1/2 BEV (NEEDLE) IMPLANT
NS IRRIG 1000ML POUR BTL (IV SOLUTION) ×2 IMPLANT
PACK ORTHO EXTREMITY (CUSTOM PROCEDURE TRAY) ×2 IMPLANT
PAD ARMBOARD 7.5X6 YLW CONV (MISCELLANEOUS) ×4 IMPLANT
PAD CAST 4YDX4 CTTN HI CHSV (CAST SUPPLIES) IMPLANT
PADDING CAST COTTON 6X4 STRL (CAST SUPPLIES) IMPLANT
PIN GUIDE 1.3 (PIN) IMPLANT
PLATE FIB EVOS 2.7/3.5 7H R103 (Plate) IMPLANT
SCREW CANNULATED 4.0X60 (Screw) IMPLANT
SCREW CORT 2.7X12 EVOS (Screw) IMPLANT
SCREW CORT 2.7X14 T8 EVOS (Screw) IMPLANT
SCREW CORT 2.7X16 STAR T8 EVOS (Screw) IMPLANT
SCREW CORT 2.7X18 T8 ST EVOS (Screw) IMPLANT
SCREW CORT 3.5X10MM ST EVOS (Screw) IMPLANT
SCREW CORT EVOS ST 3.5X12 (Screw) IMPLANT
SCREW CTX 3.5X48MM EVOS (Screw) IMPLANT
SCREW CTX 3.5X50MM EVOS (Screw) IMPLANT
SCREW LOCK 2.7X13 ST EVOS (Screw) IMPLANT
SPLINT PLASTER CAST XFAST 5X30 (CAST SUPPLIES) IMPLANT
SPONGE T-LAP 18X18 ~~LOC~~+RFID (SPONGE) IMPLANT
SUCTION TUBE FRAZIER 10FR DISP (SUCTIONS) ×2 IMPLANT
SUT ETHILON 3 0 PS 1 (SUTURE) ×2 IMPLANT
SUT VIC AB 0 CT1 27XBRD ANBCTR (SUTURE) IMPLANT
SUT VIC AB 0 CT1 36 (SUTURE) IMPLANT
SUT VIC AB 2-0 CT1 TAPERPNT 27 (SUTURE) ×2 IMPLANT
SUT VIC AB 2-0 CT2 27 (SUTURE) IMPLANT
SYR CONTROL 10ML LL (SYRINGE) IMPLANT
TOWEL GREEN STERILE (TOWEL DISPOSABLE) ×4 IMPLANT
TUBE CONNECTING 12X1/4 (SUCTIONS) ×2 IMPLANT
YANKAUER SUCT BULB TIP NO VENT (SUCTIONS) ×2 IMPLANT

## 2023-04-24 NOTE — Op Note (Signed)
 ..Orthopaedic Surgery Operative Note (CSN: [id number])  Peggy House  30-Nov-1960 Date of Surgery: 04/24/2023   Diagnoses:  Right ankle trimalleolar fracture  Procedure: Right ankle open reduction internal fixation   Operative Finding Successful completion of the planned procedure. Oblique distal fibula fracture. Transverse anterior medial malleolus fracture. Small posterior lip fracture of the posterior malleolus  Post-operative plan: The patient will be NWB RLE for 8-12 weeks.  The patient will be evaluated by PT and OT.  DVT prophylaxis Lovenox  40 mg/day until mobilizing and then consider transition in clinic to alternative medicines.   Pain control with PRN pain medication preferring oral medicines.  Follow up plan will be scheduled in approximately 14 days for incision check and XR.  Post-Op Diagnosis: Same Surgeons:Primary: Sherida Adine BROCKS, MD Assistants:Joe Melvenia, RFNA [facility] ROOM 02 Anesthesia: General with regional anesthesia Antibiotics: Ancef  2 g with local vancomycin  powder 1 g at the surgical site Tourniquet time:  Total Tourniquet Time Documented: Thigh (Right) - 24 minutes Total: Thigh (Right) - 24 minutes  Estimated Blood Loss: 50 cc Complications: None Specimens: None Implants: SMITH AND NEPHEW 4.0 MM PARTIALLY THREADED CANNULATED SCREW X2 SMITH AND NEPHEW 2.7/3.5 MM RIGHT DISTAL FIBULA EVOS PLATE 2.7 MM LOCKING SCREWS X5 3.5  MM CORTICAL SCREWS X5  Indications for Surgery:   Peggy House is a 63 y.o. female with a right ankle trimalleolar ankle fracture sustained after a syncopal episode on 04/13/23. She had a closed reduction performed in the ED. She has a medical history significant for recurrent blood loss anemia from hereditary hemorrhagic telangiectasia with a Hgb of 5.1 on admission, hypertension, hyperlipidemia, GI bleed, tobacco use disorder, obesity and type 2 diabetes. She had some initial significant swelling that delayed her  surgical treatment. She received several transfusions of pRBCs and was medically optimized for surgical intervention. She was indicated for a right ankle open reduction internal fixation.   The risks and benefit of surgery discussed with patient all questions answered.  These risks include but are not limited to infection, bleeding, damage to neurovascular structures, failure to heal, malunion, nonunion, prominent hardware, need for additional surgery, stiffness, prolonged recovery, need for assistive device and cardiopulmonary complications from anesthesia. The patient also understands she is high risk given her diabetes and bleeding disorder.  The patient understands all this and wished to proceed with surgery    Procedure:   The patient was identified properly. Informed consent was obtained and the surgical site was marked. The patient was taken up to suite where general anesthesia was induced.  The patient was positioned supine.  The right leg was prepped and draped in the usual sterile fashion.  Timeout was performed before the beginning of the case.  We began with our ORIF of the medial malleolus. An longitudinal incision was made over the anterior aspect of the medial malleolus were able to identify the fracture site itself. A point the fracture site was cleared of interposed periosteum and we were able to obtain an anatomic reduction was held with point-to-point clamp. At this point we placed 2 partially threaded 4.0 mm cannulated screws across the fracture site achieving good purchase and good compression with each screw.  We then turned our attention the ORIF of the fibula. A longitudinal approach was made along the lateral border of the fibula centered at the fracture site. We dissected down taking care to avoid the superficial peroneal nerve which crossed proximal to our incision. We encountered the fracture site and noted  a oblique distal fibula fracture. The bone quality was poor. We are able  to reduce it anatomically. We identified that the fracture was not amenable to lag screw fixation given the poor bone quality. We applied a Smith and [employer]gr[employer] Pride 2.7/3.5 mm distal fibula plate  We then filled 5 distal holes with locking screws and 3 holes proximal achieving  We confirmed anatomic reduction of fluoroscopy.   Given the patient's significant displacement on initial presentation and history significant for smoking and diabetes, the decision was made to add additional syndesmotic fixation. A large periarticular clamp was placed on the distal fibula and anteromedial tibia. Anatomic syndesmotic reduction was confirmed on biplanar fluoroscopy as well as direct visualization. One 3.5 mm tricortical and one 3.5 mm quadricortical screw were placed across the fibula and tibia parallel to the joint surface. The posterior malleolus fragment was small and appeared well positioned and fixation was not performed. Final fluoroscopic images were taken.  We irrigated the wound copiously before placing local antibiotic as listed above.  We closed the incision in a multilayer fashion with absorbable suture and the skin was closed with 3-0 nylon sutures. Sterile dressing was placed followed by a well padded short leg splint with posterior slab and stirrups.  Patient was awoken taken to PACU in stable condition.  Peggy House, RFNA was present and scrubbed for the entirety of the procedure. He was necessary for positioning of the limb, retraction, and wound closure.  Peggy House Mon 04/24/2023, 8:35 PM Orthopaedic Surgery

## 2023-04-24 NOTE — Plan of Care (Signed)

## 2023-04-24 NOTE — Progress Notes (Addendum)
 HD#10 SUBJECTIVE:  Patient Summary: Peggy House is a 63 y.o. with a pertinent PMH of hereditary hemorrhagic telangiectasia, chronic anemia, hypertension, hyperlipidemia, GI bleed, and type 2 diabetes , who presented with syncopal episode and admitted for symptomatic anemia and R ankle fracture.    Overnight Events: None  Interim History: Pt doing well this morning. She reports increased pain in her R ankle fracture but had not asked for pain medicine yet today. She is otherwise well. Reports nerves for today's surgery.  OBJECTIVE:  Vital Signs: Vitals:   04/23/23 1604 04/23/23 2100 04/24/23 0401 04/24/23 0722  BP: (!) 155/64 (!) 155/62 (!) 141/60 (!) 175/59  Pulse: (!) 54 60 (!) 51 (!) 50  Resp: 17 18 18 19   Temp: 98.1 F (36.7 C) 98.2 F (36.8 C) 98.3 F (36.8 C) 97.7 F (36.5 C)  TempSrc: Oral  Oral Oral  SpO2: 100% 100% 98% 99%  Weight:      Height:       Supplemental O2: Room Air SpO2: 99 %  Filed Weights   04/13/23 0313  Weight: 104.1 kg     Intake/Output Summary (Last 24 hours) at 04/24/2023 1333 Last data filed at 04/23/2023 1655 Gross per 24 hour  Intake 177 ml  Output 300 ml  Net -123 ml   Net IO Since Admission: -4,250.33 mL [04/24/23 1333]  Physical Exam: Physical Exam Constitutional:      Appearance: She is not ill-appearing.  Cardiovascular:     Rate and Rhythm: Normal rate.  Pulmonary:     Effort: Pulmonary effort is normal. CTABL. Auscultated front and back. Abdominal:     General: Abdomen is flat.  Musculoskeletal:     Comments: R ankle in splint. Swelling beneath bandages continues to improve. The splint is intact and snug. R leg/foot is neurovascularly inact. Toes are warm and able to move.   Skin:    General: Skin is warm.     Capillary Refill: Capillary refill takes less than 2 seconds.  Neurological:     Mental Status: She is alert.  Psychiatric:        Mood and Affect: Mood normal.        Behavior: Behavior normal.    Patient Lines/Drains/Airways Status     Active Line/Drains/Airways     Name Placement date Placement time Site Days   Implanted Port 07/08/18 Right Chest 07/08/18  --  Chest  1751   External Urinary Catheter 04/22/23  1000  --  2   Pressure Injury 04/22/23 Buttocks Right;Left Stage 2 -  Partial thickness loss of dermis presenting as a shallow open injury with a red, pink wound bed without slough.  04/22/23  1000  -- 2             ASSESSMENT/PLAN:  Assessment: Principal Problem:   Closed right trimalleolar fracture Active Problems:   Acute on chronic blood loss anemia   Anxiety   Essential hypertension   Obesity, Class III, BMI 40-49.9 (morbid obesity) (HCC)   Cough   Type 2 diabetes mellitus with vascular disease (HCC)   Syncope   HHT (hereditary hemorrhagic telangiectasia) (HCC)   Thrombocytopenia (HCC)  Peggy House is a 63 y.o. woman with a PMH of hereditary hemorrhagic telangiectasia, chronic iron  deficiency anemia, type 2 diabetes, tobacco use disorder, GAD who presented following a syncopal episode and admitted for symptomatic anemia and R ankle fracture.   She is medically stable. We are monitoring Hg and pain in anticipation of surgery  for R ankle fracture scheduled today, 04/24/23.   Plan: Trimalleolar R Ankle Fracture Ankle fracture due to fall/syncope. Orthopedics involved. Closed reduction Sunday 12/29. Plan for surgery today, she is NPO. Pain is well controlled with outlined pain regimen below. She had some pain today but had not asked for her pain medicine. Exam remains unchanged: neurovascularly intact with no new swelling.  - Surgery today, Thursday 1/9 (Busy OR yesterday required delay of surgery) - Scheduled Tylenol  1 g 3 times daily, oxycodone  5mg  q4h prn. For bowel regimen, Senna-docusate BID, Miralax  BID. Last BM 1/7. - Elevate leg, ice packs   Symptomatic anemia Hereditary hemorrhagic telangiectasia Goal to keep Hg above 8, at goal, s/p 6u  PRBCs sporadically over this hospitalization.  Given HHT, history of multiple bleeding events, including GI, but denies any now.  Bleeding primarily is epistaxis, twice since here controlled with pressure. As outpatient, she typically requires transfusions weekly and reportedly difficult to maintain Hg above 8. - Monitor Hg, attempt to keep above 8. Monitor for signs of bleeding. For recurrent nosebleeds, attempt control with cold packs and pressure. Silver  nitrate can be tried if source of bleeding evident. If persistent, consider ENT for hemostasis.    Syncope due to acute on chronic blood loss anemia (resolved) Most likely due to acute anemia at admission, Hg 5. - Echo without valvular disease. Grade 2 diastolic dysfunction. - Holding home BP regimen for now, will monitor Bps. Home reg amlodipine  10, lisinopril  20.   URI, cough   Not hypoxic.  Report subjective dyspnea and a new cough a few days ago.  Lung exam stable.  CXR negative a few days ago.  Treating with guaifenesin  + dextromethorphan  prn.   Type 2 diabetes Last A1c 02/01/2023, 5.0.  She seems to be well-controlled for many years.  - Metformin  held at admission, can discontinue given low A1c - CBG   Tobacco use disorder Half pack a day smoker. -Will hold nicotine  patch in setting of possible surgery -No PFTs on file to formally diagnose COPD, resume home albuterol    GAD Resume PTA trazodone  50prn, Celexa  20qd. Remeron  7.5qd held at admission.   Acute kidney injury (resolved) Resolved with oral rehydration. Prerenal.   Best Practice: Diet: Cardiac diet IVF: None VTE: SCDs Start: 04/13/23 0620 Code: DNR/DNI AB: None DISPO: Anticipated discharge in 4 days to home vs SNF pending Surgery.  Signature: Lonni Africa, D.O.  Internal Medicine Resident, PGY-1 [name]  Pager: [phone] 1:33 PM, 04/24/2023   Please contact the on call pager after 5 pm and on weekends at [phone].

## 2023-04-24 NOTE — Care Plan (Signed)
 63 year old female with a right ankle fracture. Her swelling has improved and is safe to proceed with open reduction internal fixation right ankle.  The risks and benefit of surgery discussed with patient all questions answered.  These risks include but are not limited to infection, bleeding, damage to neurovascular structures, failure to heal, malunion, nonunion, prominent hardware, need for additional surgery, stiffness, prolonged recovery, need for assistive device and cardiopulmonary complications from anesthesia. The patient also understands she is high risk given her diabetes and bleeding disorder.  The patient understands all this and wished to proceed with surgery  04/24/2023, 2:45 PM Orthopaedic Surgery

## 2023-04-24 NOTE — Progress Notes (Signed)
 PT Cancellation Note  Patient Details Name: Peggy House MRN: [id number] DOB: [birth year]   Cancelled Treatment:    Reason Eval/Treat Not Completed: Patient at procedure or test/unavailable  Patient was taken off unit for surgery. Will follow for re-evaluation, likely tomorrow.  Leontine Roads, PT, DPT [facility]  Rehabilitation Services Physical Therapist Office: [phone] Website: [email or web address]   [name] 04/24/2023, 3:00 PM

## 2023-04-24 NOTE — Transfer of Care (Signed)
 Immediate Anesthesia Transfer of Care Note  Patient: Peggy House  Procedure(s) Performed: OPEN REDUCTION INTERNAL FIXATION (ORIF) ANKLE FRACTURE (Right: Ankle)  Patient Location: PACU  Anesthesia Type:General  Level of Consciousness: awake, alert , and oriented  Airway & Oxygen Therapy: Patient Spontanous Breathing  Post-op Assessment: Report given to RN and Post -op Vital signs reviewed and stable  Post vital signs: Reviewed and stable  Last Vitals:  Vitals Value Taken Time  BP    Temp    Pulse 61 04/24/23 1800  Resp 18 04/24/23 1800  SpO2 97 % 04/24/23 1800  Vitals shown include unfiled device data.  Last Pain:  Vitals:   04/24/23 1439  TempSrc:   PainSc: 3       Patients Stated Pain Goal: 0 (04/24/23 0500)  Complications: No notable events documented.

## 2023-04-24 NOTE — Anesthesia Procedure Notes (Signed)
 Anesthesia Regional Block: Popliteal block   Pre-Anesthetic Checklist: , timeout performed,  Correct Patient, Correct Site, Correct Laterality,  Correct Procedure, Correct Position, site marked,  Risks and benefits discussed,  Pre-op evaluation,  At surgeon's request and post-op pain management  Laterality: Right  Prep: Maximum Sterile Barrier Precautions used, chloraprep       Needles:  Injection technique: Single-shot  Needle Type: Echogenic Stimulator Needle     Needle Length: 9cm  Needle Gauge: 21     Additional Needles:   Procedures:,,,, ultrasound used (permanent image in chart),,    Narrative:  Start time: 04/24/2023 2:56 PM End time: 04/24/2023 3:06 PM Injection made incrementally with aspirations every 5 mL.  Performed by: Personally  Anesthesiologist: Epifanio Fallow, MD

## 2023-04-24 NOTE — Anesthesia Postprocedure Evaluation (Signed)
 Anesthesia Post Note  Patient: Peggy House  Procedure(s) Performed: OPEN REDUCTION INTERNAL FIXATION (ORIF) ANKLE FRACTURE (Right: Ankle)     Patient location during evaluation: PACU Anesthesia Type: General Level of consciousness: awake and alert Pain management: pain level controlled Vital Signs Assessment: post-procedure vital signs reviewed and stable Respiratory status: spontaneous breathing, nonlabored ventilation, respiratory function stable and patient connected to nasal cannula oxygen Cardiovascular status: blood pressure returned to baseline and stable Postop Assessment: no apparent nausea or vomiting Anesthetic complications: no   No notable events documented.  Last Vitals:  Vitals:   04/24/23 1845 04/24/23 1957  BP: (!) 173/72 (!) 183/77  Pulse: (!) 53 (!) 50  Resp: 18 18  Temp: 36.4 C 36.6 C  SpO2: 99% 100%    Last Pain:  Vitals:   04/24/23 1957  TempSrc: Oral  PainSc:                  Fatisha Rabalais R Danaisha Celli

## 2023-04-24 NOTE — Anesthesia Procedure Notes (Signed)
 Procedure Name: LMA Insertion Date/Time: 04/24/2023 3:41 PM  Performed by: Vinita Geanie LABOR, CRNAPre-anesthesia Checklist: Patient identified, Emergency Drugs available, Suction available and Patient being monitored Patient Re-evaluated:Patient Re-evaluated prior to induction Oxygen Delivery Method: Circle system utilized Preoxygenation: Pre-oxygenation with 100% oxygen Induction Type: IV induction Ventilation: Mask ventilation without difficulty LMA: LMA inserted LMA Size: 4.0 Tube type: Oral Number of attempts: 1 Placement Confirmation: positive ETCO2 and breath sounds checked- equal and bilateral Tube secured with: Tape Dental Injury: Teeth and Oropharynx as per pre-operative assessment

## 2023-04-24 NOTE — Anesthesia Procedure Notes (Signed)
 Anesthesia Regional Block: Adductor canal block   Pre-Anesthetic Checklist: , timeout performed,  Correct Patient, Correct Site, Correct Laterality,  Correct Procedure, Correct Position, site marked,  Risks and benefits discussed,  Pre-op evaluation,  At surgeon's request and post-op pain management  Laterality: Right  Prep: Maximum Sterile Barrier Precautions used, chloraprep       Needles:  Injection technique: Single-shot  Needle Type: Echogenic Stimulator Needle     Needle Length: 9cm  Needle Gauge: 21     Additional Needles:   Procedures:,,,, ultrasound used (permanent image in chart),,    Narrative:  Start time: 04/24/2023 3:06 PM End time: 04/24/2023 3:11 PM Injection made incrementally with aspirations every 5 mL.  Performed by: Personally  Anesthesiologist: Epifanio Fallow, MD

## 2023-04-24 NOTE — Anesthesia Preprocedure Evaluation (Addendum)
 Anesthesia Evaluation  Patient identified by MRN, date of birth, ID band Patient awake    Reviewed: Allergy & Precautions, H&P , NPO status , Patient's Chart, lab work & pertinent test results  Airway Mallampati: II  TM Distance: >3 FB Neck ROM: Full    Dental no notable dental hx. (+) Partial Lower, Partial Upper   Pulmonary Current Smoker   Pulmonary exam normal breath sounds clear to auscultation       Cardiovascular hypertension, Pt. on medications  Rhythm:Regular Rate:Normal     Neuro/Psych   Anxiety Depression    negative neurological ROS     GI/Hepatic Neg liver ROS,GERD  Medicated,,  Endo/Other  diabetes, Type 2, Oral Hypoglycemic Agents  Class 3 obesity  Renal/GU negative Renal ROS  negative genitourinary   Musculoskeletal  (+) Arthritis , Osteoarthritis,    Abdominal   Peds  Hematology  (+) Blood dyscrasia, anemia   Anesthesia Other Findings   Reproductive/Obstetrics negative OB ROS                             Anesthesia Physical Anesthesia Plan  ASA: 3  Anesthesia Plan: General   Post-op Pain Management: Regional block*   Induction: Intravenous  PONV Risk Score and Plan: 3 and Ondansetron , Dexamethasone  and Midazolam   Airway Management Planned: LMA  Additional Equipment:   Intra-op Plan:   Post-operative Plan: Extubation in OR  Informed Consent: I have reviewed the patients History and Physical, chart, labs and discussed the procedure including the risks, benefits and alternatives for the proposed anesthesia with the patient or authorized representative who has indicated his/her understanding and acceptance.   Patient has DNR.  Discussed DNR with patient and Suspend DNR.   Dental advisory given  Plan Discussed with: CRNA  Anesthesia Plan Comments:        Anesthesia Quick Evaluation

## 2023-04-25 DIAGNOSIS — I10 Essential (primary) hypertension: Secondary | ICD-10-CM

## 2023-04-25 DIAGNOSIS — D649 Anemia, unspecified: Secondary | ICD-10-CM | POA: Diagnosis not present

## 2023-04-25 DIAGNOSIS — S82851D Displaced trimalleolar fracture of right lower leg, subsequent encounter for closed fracture with routine healing: Secondary | ICD-10-CM | POA: Diagnosis not present

## 2023-04-25 LAB — CBC
HCT: 27.2 % — ABNORMAL LOW (ref 36.0–46.0)
Hemoglobin: 8.1 g/dL — ABNORMAL LOW (ref 12.0–15.0)
MCH: 26.3 pg (ref 26.0–34.0)
MCHC: 29.8 g/dL — ABNORMAL LOW (ref 30.0–36.0)
MCV: 88.3 fL (ref 80.0–100.0)
Platelets: 397 10*3/uL (ref 150–400)
RBC: 3.08 MIL/uL — ABNORMAL LOW (ref 3.87–5.11)
RDW: 17.2 % — ABNORMAL HIGH (ref 11.5–15.5)
WBC: 11.3 10*3/uL — ABNORMAL HIGH (ref 4.0–10.5)
nRBC: 0 % (ref 0.0–0.2)

## 2023-04-25 LAB — RENAL FUNCTION PANEL
Albumin: 2.6 g/dL — ABNORMAL LOW (ref 3.5–5.0)
Anion gap: 9 (ref 5–15)
BUN: 6 mg/dL — ABNORMAL LOW (ref 8–23)
CO2: 24 mmol/L (ref 22–32)
Calcium: 8.8 mg/dL — ABNORMAL LOW (ref 8.9–10.3)
Chloride: 107 mmol/L (ref 98–111)
Creatinine, Ser: 0.69 mg/dL (ref 0.44–1.00)
GFR, Estimated: >60 mL/min (ref >60)
Glucose, Bld: 104 mg/dL — ABNORMAL HIGH (ref 70–99)
Phosphorus: 3.7 mg/dL (ref 2.5–4.6)
Potassium: 3.5 mmol/L (ref 3.5–5.1)
Sodium: 140 mmol/L (ref 135–145)

## 2023-04-25 LAB — GLUCOSE, CAPILLARY
Glucose-Capillary: 145 mg/dL — ABNORMAL HIGH (ref 70–99)
Glucose-Capillary: 124 mg/dL — ABNORMAL HIGH (ref 70–99)
Glucose-Capillary: 149 mg/dL — ABNORMAL HIGH (ref 70–99)
Glucose-Capillary: 104 mg/dL — ABNORMAL HIGH (ref 70–99)

## 2023-04-25 MED ORDER — OXYCODONE HCL 5 MG PO TABS
10.0000 mg | ORAL_TABLET | ORAL | Status: DC | PRN
  Administered 2023-04-25 – 2023-05-02 (×26): 10 mg via ORAL
  Filled 2023-04-25 (×26): qty 2

## 2023-04-25 MED ORDER — OXYCODONE HCL 5 MG PO TABS
5.0000 mg | ORAL_TABLET | Freq: Once | ORAL | Status: AC
  Administered 2023-04-25: 5 mg via ORAL
  Filled 2023-04-25: qty 1

## 2023-04-25 MED ORDER — AMLODIPINE BESYLATE 10 MG PO TABS
10.0000 mg | ORAL_TABLET | Freq: Every day | ORAL | Status: DC
  Administered 2023-04-25 – 2023-05-02 (×8): 10 mg via ORAL
  Filled 2023-04-25 (×8): qty 1

## 2023-04-25 NOTE — TOC Progression Note (Signed)
 Transition of Care [facility]) - Progression Note    Patient Details  Name: Peggy House MRN: [id number] Date of Birth: Apr 28, 1960  [facility] of [facility]) CM/SW Contact  Milliana Reddoch A Dory [place], [place] Phone Number: 04/25/2023, 2:16 PM  Clinical Narrative:     CSW met with pt at bedside. She stated that she was agreeable  to  SNF workup and confirmed understanding of education that pt may not get many if any bed offers due to her Medicaid payor source. Snf process explained. SNF workup completed, bed offers pending.    TOC will continue to follow.        Expected Discharge Plan and Services                                               Social Determinants of Health (SDOH) Interventions SDOH Screenings   Food Insecurity: No Food Insecurity (04/13/2023)  Housing: High Risk (04/13/2023)  Transportation Needs: No Transportation Needs (04/13/2023)  Recent Concern: Transportation Needs - Unmet Transportation Needs (01/31/2023)  Utilities: Not At Risk (04/13/2023)  Recent Concern: Utilities - At Risk (01/31/2023)  Depression (PHQ2-9): High Risk (06/20/2022)  Tobacco Use: High Risk (04/24/2023)    Readmission Risk Interventions    04/18/2023    3:07 PM 04/15/2023    2:22 PM  Readmission Risk Prevention Plan  Transportation Screening Complete Complete  PCP or Specialist Appt within 5-7 Days  Complete  PCP or Specialist Appt within 3-5 Days Complete   Home Care Screening  Complete  Medication Review (RN CM)  Complete  HRI or Home Care Consult Complete   Social Work Consult for Recovery Care Planning/Counseling Complete   Palliative Care Screening Complete   Medication Review Oceanographer) Complete

## 2023-04-25 NOTE — NC FL2 (Signed)
 [place]  MEDICAID FL2 LEVEL OF CARE FORM     IDENTIFICATION  Patient Name: Peggy House Birthdate: 1960-06-16 Sex: female Admission Date (Current Location): 04/13/2023  [place] and [place] Number:  [place] [id number] Facility and Address:  [facility], [address], [place], [place] [address]      Provider Number: [id number]  Attending Physician Name and Address:  Karna Fellows, MD  Relative Name and Phone Number:  Tennie Dillon [phone])  [phone]    Current Level of Care: Hospital Recommended Level of Care: Skilled Nursing Facility Prior Approval Number:    Date Approved/Denied:   PASRR Number: [id number]  Discharge Plan: SNF    Current Diagnoses: Patient Active Problem List   Diagnosis Date Noted   Thrombocytopenia (HCC) 04/15/2023   Closed right trimalleolar fracture 04/14/2023   ABLA (acute blood loss anemia) 01/31/2023   Cholelithiasis 11/18/2022   Aortic atherosclerosis (HCC) 11/18/2022   Symptomatic anemia 09/26/2022   Tobacco use disorder 06/22/2022   Depression with anxiety 04/12/2022   HHT (hereditary hemorrhagic telangiectasia) (HCC) 03/23/2021   Syncope 11/01/2020   Trapezius muscle spasm 07/22/2020   Acute GI bleeding 03/01/2020   Leg pain, bilateral 09/28/2019   Urge incontinence 09/28/2019   Pain due to onychomycosis of toenails of both feet 02/26/2019   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   Anemia due to GI blood loss 12/07/2018   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   Acute deep vein thrombosis (DVT) of right upper extremity (HCC) 12/07/2018   Hypokalemia    Port-A-Cath in place 09/18/2018   GAD (generalized anxiety disorder) 01/20/2018   GI bleeding 10/25/2017   Pulmonary artery hypertension (HCC) 10/19/2017   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   Venous stasis dermatitis of both lower extremities  05/07/2017   Recurrent epistaxis 11/29/2016   Iron  deficiency anemia 10/29/2016   Cough 05/13/2016   Osteoarthritis, multiple sites 06/05/2015   Insomnia 06/05/2015   Major depressive disorder, recurrent episode (HCC) 06/05/2015   Obesity, Class III, BMI 40-49.9 (morbid obesity) (HCC) 04/26/2014   GI bleed 04/25/2014   HLD (hyperlipidemia) 04/25/2014   Anxiety    Essential hypertension    Acute on chronic blood loss anemia 04/07/2014    Orientation RESPIRATION BLADDER Height & Weight     Self, Time, Situation, Place  Normal Incontinent, External catheter Weight: 226 lb (102.5 kg) Height:  5' 4.5 (163.8 cm)  BEHAVIORAL SYMPTOMS/MOOD NEUROLOGICAL BOWEL NUTRITION STATUS      Incontinent Diet (see DC summary)  AMBULATORY STATUS COMMUNICATION OF NEEDS Skin   Extensive Assist Verbally Other (Comment), Surgical wounds (Buttocks Right;Left Stage 2 - Partial thickness loss of dermis presenting as a shallow open injury with a red, pink wound bed without slough. Incision (Closed) 04/24/23 Ankle Right)                       Personal Care Assistance Level of Assistance  Bathing, Feeding, Dressing Bathing Assistance: Maximum assistance Feeding assistance: Limited assistance Dressing Assistance: Maximum assistance     Functional Limitations Info  Sight, Hearing, Speech Sight Info: Impaired Hearing Info: Adequate Speech Info: Adequate    SPECIAL CARE FACTORS FREQUENCY  PT (By licensed PT), OT (By licensed OT)     PT Frequency: 5x/week OT Frequency: 5x/week            Contractures Contractures Info: Not present    Additional Factors Info  Code Status,  Allergies Code Status Info: DNR Allergies Info: Feraheme  (Ferumoxytol )  Nsaids  Tomato  Iron  (Ferrous Sulfate ) (Ferrous Sulfate  Er)  Wasp Venom           Current Medications (04/25/2023):  This is the current hospital active medication list Current Facility-Administered Medications  Medication Dose Route Frequency  Provider Last Rate Last Admin   0.9 %  sodium chloride  infusion (Manually program via Guardrails IV Fluids)   Intravenous Once Juberg, Christopher, DO       acetaminophen  (TYLENOL ) tablet 1,000 mg  1,000 mg Oral Q8H Patel, Amar, DO   1,000 mg at 04/25/23 0553   albuterol  (PROVENTIL ) (2.5 MG/3ML) 0.083% nebulizer solution 2.5 mg  2.5 mg Inhalation Q6H PRN Tobie Gaines, DO       aminocaproic  acid (AMICAR ) tablet 1 g  1 g Oral Q12H Juberg, Christopher, DO   1 g at 04/24/23 2213   amLODipine  (NORVASC ) tablet 10 mg  10 mg Oral Daily Juberg, Christopher, DO   10 mg at 04/25/23 1042   ceFAZolin  (ANCEF ) IVPB 2g/100 mL premix  2 g Intravenous Q8H Sherida Adine BROCKS, MD 200 mL/hr at 04/25/23 0743 2 g at 04/25/23 9256   Chlorhexidine  Gluconate Cloth 2 % PADS 6 each  6 each Topical Daily Trudy Mliss Dragon, MD   6 each at 04/24/23 0700   citalopram  (CELEXA ) tablet 20 mg  20 mg Oral Daily Tobie Gaines, DO   20 mg at 04/25/23 9096   dextromethorphan -guaiFENesin  (MUCINEX  DM) 30-600 MG per 12 hr tablet 2 tablet  2 tablet Oral BID PRN Karna Fellows, MD       feeding supplement (ENSURE ENLIVE / ENSURE PLUS) liquid 237 mL  237 mL Oral BID BM Trudy Mliss Dragon, MD   237 mL at 04/25/23 0903   lactose free nutrition (BOOST PLUS) liquid 237 mL  237 mL Oral TID WC Karna Fellows, MD   237 mL at 04/25/23 9294   oxyCODONE  (Oxy IR/ROXICODONE ) immediate release tablet 10 mg  10 mg Oral Q4H PRN Karna Fellows, MD       pantoprazole  (PROTONIX ) EC tablet 40 mg  40 mg Oral BID Patel, Amar, DO   40 mg at 04/25/23 0903   polyethylene glycol (MIRALAX  / GLYCOLAX ) packet 17 g  17 g Oral BID Juberg, Christopher, DO   17 g at 04/24/23 2213   senna-docusate (Senokot-S) tablet 1 tablet  1 tablet Oral BID Juberg, Christopher, DO   1 tablet at 04/24/23 2213   sodium chloride  flush (NS) 0.9 % injection 10-40 mL  10-40 mL Intracatheter Q12H Trudy Mliss Dragon, MD   10 mL at 04/25/23 9095   traZODone  (DESYREL ) tablet 50 mg  50 mg Oral QHS PRN Patel,  Amar, DO   50 mg at 04/23/23 2226   trimethobenzamide  (TIGAN ) injection 200 mg  200 mg Intramuscular Q8H PRN Zheng, Michael, DO       Facility-Administered Medications Ordered in Other Encounters  Medication Dose Route Frequency Provider Last Rate Last Admin   diphenhydrAMINE  (BENADRYL ) 25 mg capsule            diphenhydrAMINE  (BENADRYL ) 25 mg capsule            phenylephrine  (NEO-SYNEPHRINE) 1 % nasal drops 2 drop  2 drop Left Nare Once Lanny Callander, MD         Discharge Medications: Please see discharge summary for a list of discharge medications.  Relevant Imaging Results:  Relevant Lab Results:   Additional Information [id number]  Nephtali Docken A  Gracelynne Benedict, LCSWA

## 2023-04-25 NOTE — Progress Notes (Signed)
 Physical Therapy Treatment Patient Details Name: Peggy House MRN: [id number] DOB: 11/16/1960 Today's Date: 04/25/2023   History of Present Illness 63 y.o. female Presented with syncopal episode and admitted for symptomatic anemia and R ankle fracture, external reduction performed 12/29. Pt underwent rt ankle ORIF on 04/24/23.  PMH includes: hereditary Hemorrhagic Telangiectasia, blood loss anemia, MDD, GAD, arthritis, HTN, obesity, DM2.    PT Comments  Pt reassessed after surgery yesterday. Status essentially unchanged with pt still needing to maintain NWB on RLE. Functionally pt will need to focus on transfers to w/c and w/c mobility since expect ambulating while maintaining NWB will be a long term goal. Pt reports she thinks she can get a w/c in some parts of her home if some furniture is rearranged. Highly doubtful that she will be able to access the bathroom with the w/c. Discussed with pt that currently she will need assistance every time she needs to get up or use the bathroom. Pt verbalizes understanding and is agreeable to continued inpatient follow up therapy, <3 hours/day     If plan is discharge home, recommend the following: A lot of help with walking and/or transfers;A lot of help with bathing/dressing/bathroom;Assist for transportation   Can travel by private vehicle        Equipment Recommendations  Wheelchair (measurements PT);Wheelchair cushion (measurements PT);BSC/3in1 (drop arm commode, 18 w/c with removable armrests and elevating leg rests)    Recommendations for Other Services       Precautions / Restrictions Precautions Precautions: Fall Restrictions Weight Bearing Restrictions Per Provider Order: Yes RLE Weight Bearing Per Provider Order: Non weight bearing     Mobility  Bed Mobility Overal bed mobility: Needs Assistance       Supine to sit: Used rails, Min assist, HOB elevated     General bed mobility comments: Assist to bring RLE off of bed     Transfers Overall transfer level: Needs assistance Equipment used: Rolling walker (2 wheels) Transfers: Sit to/from Stand, Bed to chair/wheelchair/BSC Sit to Stand: Mod assist, +2 physical assistance          Lateral/Scoot Transfers: Min assist, +2 safety/equipment General transfer comment: Bed to chair with lateral scoot toward rt with drop arm recliner with assist for balance and verbal/tactile cues for technique. Tactile monitoring of NWB. From chair stood with walker with +2 mod assist to bring hips up and stabilize. Pt able to maintain NWB with tactile cues    Ambulation/Gait                   Stairs             Wheelchair Mobility     Tilt Bed    Modified Rankin (Stroke Patients Only)       Balance Overall balance assessment: Needs assistance Sitting-balance support: No upper extremity supported, Feet supported Sitting balance-Leahy Scale: Good     Standing balance support: Bilateral upper extremity supported, Reliant on assistive device for balance Standing balance-Leahy Scale: Poor Standing balance comment: heavy reliance on RW for BUE support. Altered balance strategies due to RLE NWB precautions                            Cognition Arousal: Alert Behavior During Therapy: WFL for tasks assessed/performed Overall Cognitive Status: Within Functional Limits for tasks assessed  Exercises      General Comments        Pertinent Vitals/Pain Pain Assessment Pain Assessment: 0-10 Pain Score: 10-Worst pain ever Pain Location: R ankle Pain Descriptors / Indicators: Aching, Grimacing Pain Intervention(s): Limited activity within patient's tolerance, Monitored during session, Premedicated before session, Repositioned    Home Living                          Prior Function            PT Goals (current goals can now be found in the care plan section) Acute  Rehab PT Goals Patient Stated Goal: go home Progress towards PT goals: Progressing toward goals;Goals updated    Frequency    Min 1X/week      PT Plan      Co-evaluation PT/OT/SLP Co-Evaluation/Treatment: Yes Reason for Co-Treatment: For patient/therapist safety;Other (comment) (For pain control)          AM-PAC PT 6 Clicks Mobility   Outcome Measure  Help needed turning from your back to your side while in a flat bed without using bedrails?: A Little Help needed moving from lying on your back to sitting on the side of a flat bed without using bedrails?: A Little Help needed moving to and from a bed to a chair (including a wheelchair)?: A Little Help needed standing up from a chair using your arms (e.g., wheelchair or bedside chair)?: Total Help needed to walk in hospital room?: Total Help needed climbing 3-5 steps with a railing? : Total 6 Click Score: 12    End of Session Equipment Utilized During Treatment: Gait belt Activity Tolerance: Patient tolerated treatment well Patient left: in chair;with call bell/phone within reach;with chair alarm set Nurse Communication: Mobility status (nurse tech) PT Visit Diagnosis: Unsteadiness on feet (R26.81);Other abnormalities of gait and mobility (R26.89);Pain Pain - Right/Left: Right Pain - part of body: Ankle and joints of foot     Time: 0959-1030 PT Time Calculation (min) (ACUTE ONLY): 31 min  Charges:    $Therapeutic Activity: 8-22 mins PT General Charges $$ ACUTE PT VISIT: 1 Visit                     [facility] PT Acute Rehabilitation Services Office [phone]    Rodgers ORN [facility] 04/25/2023, 11:53 AM

## 2023-04-25 NOTE — Plan of Care (Signed)

## 2023-04-25 NOTE — Progress Notes (Addendum)
 HD#11 SUBJECTIVE:  Patient Summary: Peggy House is a 63 y.o. with a pertinent PMH of hereditary hemorrhagic telangiectasia, chronic anemia, hypertension, hyperlipidemia, GI bleed, and type 2 diabetes , who presented with syncopal episode and admitted for symptomatic anemia and R ankle fracture.    Overnight Events: None  Interim History: She is sitting beside her bed this morning. Leg in bandage. Reports pain, 10/10. No complaints otherwise. Eating breakfast.  OBJECTIVE:  Vital Signs: Vitals:   04/24/23 1845 04/24/23 1957 04/25/23 0437 04/25/23 0741  BP: (!) 173/72 (!) 183/77 (!) 161/72 (!) 154/69  Pulse: (!) 53 (!) 50 (!) 57 (!) 57  Resp: 18 18 18 17   Temp: 97.6 F (36.4 C) 97.9 F (36.6 C) 98.2 F (36.8 C) 98.5 F (36.9 C)  TempSrc: Oral Oral Oral Oral  SpO2: 99% 100%  100%  Weight:      Height:       Supplemental O2: Room Air SpO2: 100 % O2 Flow Rate (L/min): 2 L/min  Filed Weights   04/13/23 0313 04/24/23 1423  Weight: 104.1 kg 102.5 kg     Intake/Output Summary (Last 24 hours) at 04/25/2023 1338 Last data filed at 04/25/2023 0840 Gross per 24 hour  Intake 905.2 ml  Output 1554 ml  Net -648.8 ml   Net IO Since Admission: -4,899.13 mL [04/25/23 1338]  Physical Exam: Physical Exam Constitutional:      Appearance: She is not ill-appearing.  Cardiovascular:     Rate and Rhythm: Normal rate.  Pulmonary:     Effort: Pulmonary effort is normal. CTABL. Auscultated front and back. Abdominal:     General: Abdomen is flat.  Musculoskeletal:     Comments: R ankle in soft cast. R leg/foot is neurovascularly inact. Toes are warm and able to move.   Skin:    General: Skin is warm.     Capillary Refill: Capillary refill takes less than 2 seconds.  Neurological:     Mental Status: She is alert.  Psychiatric:        Mood and Affect: Mood normal.        Behavior: Behavior normal.   Patient Lines/Drains/Airways Status     Active Line/Drains/Airways      Name Placement date Placement time Site Days   Implanted Port 07/08/18 Right Chest 07/08/18  --  Chest  1752   External Urinary Catheter 04/22/23  1000  --  3   Incision (Closed) 04/24/23 Ankle Right 04/24/23  1747  -- 1   Pressure Injury 04/22/23 Buttocks Right;Left Stage 2 -  Partial thickness loss of dermis presenting as a shallow open injury with a red, pink wound bed without slough.  04/22/23  1000  -- 3             ASSESSMENT/PLAN:  Assessment: Principal Problem:   Closed right trimalleolar fracture Active Problems:   Acute on chronic blood loss anemia   Anxiety   Essential hypertension   Obesity, Class III, BMI 40-49.9 (morbid obesity) (HCC)   Cough   Type 2 diabetes mellitus with vascular disease (HCC)   Syncope   HHT (hereditary hemorrhagic telangiectasia) (HCC)   Thrombocytopenia (HCC)  SEJLA MARZANO is a 63 y.o. woman with a PMH of hereditary hemorrhagic telangiectasia, chronic iron  deficiency anemia, type 2 diabetes, tobacco use disorder, GAD who presented following a syncopal episode and admitted for symptomatic anemia and R ankle fracture.   Plan: Trimalleolar R Ankle Fracture Ankle fracture due to fall/syncope. Orthopedics involved. Closed reduction  Sunday 12/29. Now s/p ORIF on 1/9. Pain control can improve, will increase her medicines. Soft cast intact, toes warm and neurovascularly intact with no new swelling.  - S/p ORIF 1/9. It went well. NWB RLE for 8-12 weeks. Follow up scheduled 14 days. - Scheduled Tylenol  1 g 3 times daily, oxycodone  10mg  q4h prn. IV okay for breakthrough. For bowel regimen, Senna-docusate BID, Miralax  BID. Last BM 1/7. - Elevate leg, ice packs   Symptomatic anemia Hereditary hemorrhagic telangiectasia Stable. Goal to keep Hg above 8, at goal, s/p 6u PRBCs sporadically over this hospitalization.  Given HHT, history of multiple bleeding events, including GI, but denies any now.  Bleeding primarily is epistaxis, twice since  here controlled with pressure. As outpatient, she requires transfusions weekly and reportedly difficult to maintain Hg above 8. - Monitor Hg, attempt to keep above 8. Monitor for signs of bleeding. For recurrent nosebleeds, attempt control with cold packs and pressure. Silver  nitrate can be tried if source of bleeding evident. If persistent, consider ENT for hemostasis.    Syncope due to acute on chronic blood loss anemia (resolved) Most likely due to acute anemia at admission (Hg 5). - Echo without valvular disease. Grade 2 diastolic dysfunction. - Bps trending high. Held home regimen in setting of fall. Will resume amlodipine  10. Still hold lisinopril  20.   URI, cough (resolving) Not hypoxic.  Reported dyspnea and a new cough a few days ago but getting better.  Lung exam and CXR unrevealing.  Treating with guaifenesin  + dextromethorphan  prn.   Type 2 diabetes Last A1c 02/01/2023, 5.0.  She seems to be well-controlled for many years.  - Metformin  held at admission, can discontinue given low A1c - CBG   Tobacco use disorder Half pack a day smoker. -Will hold nicotine  patch in setting of possible surgery -No PFTs on file to formally diagnose COPD, resume home albuterol    GAD Resume PTA trazodone  50prn, Celexa  20qd. Remeron  7.5qd held at admission.   Acute kidney injury (resolved) Resolved with oral rehydration. Prerenal.   Best Practice: Diet: Cardiac diet IVF: None VTE: SCDs Start: 04/13/23 0620 Code: DNR/DNI AB: None DISPO: Anticipated discharge in 1-2 days to home vs SNF pending Bed availability.  Signature: Lonni Africa, D.O.  Internal Medicine Resident, PGY-1 [name]  Pager: [phone] 1:38 PM, 04/25/2023   Please contact the on call pager after 5 pm and on weekends at [phone].

## 2023-04-25 NOTE — Progress Notes (Signed)
.  Subjective: 1 Day Post-Op Procedure(s) (LRB): OPEN REDUCTION INTERNAL FIXATION (ORIF) ANKLE FRACTURE (Right)  No events overnight. Pain improving.   Activity level:  Non weight bearing Right leg for 8-12 weeks post op Diet tolerance:  as tolerated Voiding:  as tolerated Patient reports pain as moderate.    Objective: Vital signs in last 24 hours: Temp:  [97.6 F (36.4 C)-98.5 F (36.9 C)] 98.5 F (36.9 C) (01/10 0741) Pulse Rate:  [45-62] 57 (01/10 0741) Resp:  [13-20] 17 (01/10 0741) BP: (154-185)/(53-77) 154/69 (01/10 0741) SpO2:  [96 %-100 %] 100 % (01/10 0741) Weight:  [102.5 kg] 102.5 kg (01/09 1423)  Labs: Recent Labs    04/22/23 2051 04/23/23 0550 04/24/23 0251 04/25/23 0409  HGB 9.2* 8.2* 8.7* 8.1*   Recent Labs    04/24/23 0251 04/25/23 0409  WBC 10.5 11.3*  RBC 3.23* 3.08*  HCT 28.8* 27.2*  PLT 420* 397   Recent Labs    04/24/23 0251 04/25/23 0409  NA 138 140  K 3.9 3.5  CL 108 107  CO2 24 24  BUN 13 6*  CREATININE 0.97 0.69  GLUCOSE 113* 104*  CALCIUM  8.5* 8.8*   No results for input(s): LABPT, INR in the last 72 hours.  Physical Exam:  Neurologically intact ABD soft Neurovascular intact Sensation intact distally Intact pulses distally EHL/FHL motor intact Incision: splint/dressing C/D/I  Assessment/Plan:  1 Day Post-Op Procedure(s) (LRB): OPEN REDUCTION INTERNAL FIXATION (ORIF) ANKLE FRACTURE (Right)  NWB RLE 8-12 weeks DVT ppx per medical team. No contraindications from orthopaedic perspective Pain control. Recommend scheduled tylenol  and PO narcotics as needed PT/OT Continue post op abx x 24 hours for surgical ppx Cleared for discharge from orthopaedic perspective. Patient will likely require SNF. Can follow up in office in 2-3 weeks post op  .Adine JAYSON Mon 04/25/2023, 12:39 PM Orthopaedic Surgery     Adine JAYSON Mon 04/25/2023, 12:28 PM

## 2023-04-25 NOTE — Evaluation (Signed)
 Occupational Therapy Re-Evaluation Patient Details Name: Peggy House MRN: [id number] DOB: [birth year] Today's Date: 04/25/2023   History of Present Illness 63 y.o. female Presented with syncopal episode and admitted for symptomatic anemia and R ankle fracture, external reduction performed 12/29. Pt underwent rt ankle ORIF on 04/24/23.  PMH includes: hereditary Hemorrhagic Telangiectasia, blood loss anemia, MDD, GAD, arthritis, HTN, obesity, DM2.   Clinical Impression     Patient seen as a re-eval this date due to surgery on R ankle (remains NWB). Patient able to complete incremental scoot to drop arm recliner at min A, and then completed sit<>stand with mod A of 2 in order to maintain NWB and to assist in transfer with RW. Patient is mod A for ADL management. OT continues to recommend rehab stint < 3 hours due to current level, and need for assist for all aspects of care. OT will continue to follow.    If plan is discharge home, recommend the following: Two people to help with walking and/or transfers;A lot of help with bathing/dressing/bathroom;Assistance with cooking/housework;Assist for transportation;Help with stairs or ramp for entrance    Functional Status Assessment  Patient has had a recent decline in their functional status and demonstrates the ability to make significant improvements in function in a reasonable and predictable amount of time.  Equipment Recommendations  Wheelchair (measurements OT);Wheelchair cushion (measurements OT)    Recommendations for Other Services       Precautions / Restrictions Precautions Precautions: Fall Restrictions Weight Bearing Restrictions Per Provider Order: Yes RLE Weight Bearing Per Provider Order: Non weight bearing      Mobility Bed Mobility Overal bed mobility: Needs Assistance       Supine to sit: Used rails, Min assist, HOB elevated     General bed mobility comments: Assist to bring RLE off of bed     Transfers Overall transfer level: Needs assistance Equipment used: Rolling walker (2 wheels) Transfers: Sit to/from Stand, Bed to chair/wheelchair/BSC Sit to Stand: Mod assist, +2 physical assistance          Lateral/Scoot Transfers: Min assist, +2 safety/equipment General transfer comment: Bed to chair with lateral scoot toward rt with drop arm recliner with assist for balance and verbal/tactile cues for technique. Tactile monitoring of NWB. From chair stood with walker with +2 mod assist to bring hips up and stabilize. Pt able to maintain NWB with tactile cues      Balance Overall balance assessment: Needs assistance Sitting-balance support: No upper extremity supported, Feet supported Sitting balance-Leahy Scale: Good     Standing balance support: Bilateral upper extremity supported, Reliant on assistive device for balance Standing balance-Leahy Scale: Poor Standing balance comment: heavy reliance on RW for BUE support. Altered balance strategies due to RLE NWB precautions                           ADL either performed or assessed with clinical judgement   ADL Overall ADL's : Needs assistance/impaired Eating/Feeding: Set up;Sitting   Grooming: Wash/dry hands;Wash/dry face;Sitting;Set up   Upper Body Bathing: Set up;Sitting   Lower Body Bathing: Moderate assistance;Maximal assistance;Sitting/lateral leans   Upper Body Dressing : Set up;Sitting   Lower Body Dressing: Maximal assistance;Total assistance;Sitting/lateral leans   Toilet Transfer: Minimal assistance;BSC/3in1 (incremental scoot transfer)   Toileting- Clothing Manipulation and Hygiene: Moderate assistance;Sitting/lateral lean Toileting - Clothing Manipulation Details (indicate cue type and reason): for thoroughness     Functional mobility during ADLs: Moderate assistance;Rolling walker (2  wheels) General ADL Comments: Patient seen as a re-eval this date due to surgery on R ankle (remains NWB).  Patient able to complete incremental scoot to drop arm recliner at min A, and then completed sit<>stand with mod A of 2 in order to maintain NWB and to assist in transfer with RW. Patient is mod A for ADL management. OT continues to recommend rehab stint < 3 hours due to current level, and need for assist for all aspects of care. OT will continue to follow.     Vision Baseline Vision/History: 1 Wears glasses;4 Cataracts Ability to See in Adequate Light: 2 Moderately impaired Vision Assessment?: No apparent visual deficits     Perception Perception: Not tested       Praxis Praxis: Not tested       Pertinent Vitals/Pain Pain Assessment Pain Assessment: 0-10 Pain Score: 10-Worst pain ever Pain Location: R ankle Pain Descriptors / Indicators: Aching, Grimacing Pain Intervention(s): Limited activity within patient's tolerance, Monitored during session, Premedicated before session, Repositioned     Extremity/Trunk Assessment Upper Extremity Assessment Upper Extremity Assessment: Generalized weakness;Right hand dominant   Lower Extremity Assessment Lower Extremity Assessment: Defer to PT evaluation   Cervical / Trunk Assessment Cervical / Trunk Assessment: Normal   Communication Communication Communication: No apparent difficulties Cueing Techniques: Verbal cues   Cognition Arousal: Alert Behavior During Therapy: WFL for tasks assessed/performed Overall Cognitive Status: Within Functional Limits for tasks assessed                                       General Comments  Exercises reviewed for BUE strength and maintain strength in LLE    Exercises     Shoulder Instructions      Home Living Family/patient expects to be discharged to:: Private residence Living Arrangements: Children (Son) Available Help at Discharge: Family;Available PRN/intermittently (son works but sister can stay as long as needed) Type of Home: Apartment Home Access: Level entry      Home Layout: One level     Bathroom Shower/Tub: Sponge bathes at baseline;Tub/shower unit   Bathroom Toilet: Standard (BSC on top) Bathroom Accessibility: Yes   Home Equipment: Agricultural [employer] (2 wheels);BSC/3in1   Additional Comments: Pt lives with son who works 3pm-3am      Prior Functioning/Environment Prior Level of Function : Needs assist             Mobility Comments: pt reported using RW off and on for a few months ADLs Comments: needed assist with dressing        OT Problem List: Decreased strength;Decreased range of motion;Decreased activity tolerance;Impaired balance (sitting and/or standing);Decreased safety awareness;Pain      OT Treatment/Interventions: Self-care/ADL training;Therapeutic exercise;Energy conservation;DME and/or AE instruction;Manual therapy;Therapeutic activities;Patient/family education;Balance training    OT Goals(Current goals can be found in the care plan section) Acute Rehab OT Goals Patient Stated Goal: to get better OT Goal Formulation: With patient Time For Goal Achievement: 05/09/23 Potential to Achieve Goals: Good  OT Frequency: Min 1X/week    Co-evaluation   Reason for Co-Treatment: For patient/therapist safety;Other (comment) (For pain control)          AM-PAC OT 6 Clicks Daily Activity     Outcome Measure Help from another person eating meals?: A Little Help from another person taking care of personal grooming?: A Little Help from another person toileting, which includes using toliet, bedpan, or urinal?: A Lot  Help from another person bathing (including washing, rinsing, drying)?: A Lot Help from another person to put on and taking off regular upper body clothing?: A Little Help from another person to put on and taking off regular lower body clothing?: A Lot 6 Click Score: 15   End of Session Equipment Utilized During Treatment: Gait belt;Rolling walker (2 wheels) Nurse Communication: Mobility status  Activity  Tolerance: Patient tolerated treatment well Patient left: in chair;with call bell/phone within reach;with chair alarm set  OT Visit Diagnosis: Pain;Muscle weakness (generalized) (M62.81);History of falling (Z91.81);Unsteadiness on feet (R26.81) Pain - Right/Left: Right Pain - part of body: Ankle and joints of foot                Time: 9041-8987 OT Time Calculation (min): 14 min Charges:  OT General Charges $OT Visit: 1 Visit OT Evaluation $OT Eval Moderate Complexity: 1 Mod  Ronal Gift E. Marvina Danner, OTR/L Acute Rehabilitation Services [phone]   Ronal Gift Salt 04/25/2023, 12:53 PM

## 2023-04-26 DIAGNOSIS — D649 Anemia, unspecified: Secondary | ICD-10-CM | POA: Diagnosis not present

## 2023-04-26 DIAGNOSIS — S82851D Displaced trimalleolar fracture of right lower leg, subsequent encounter for closed fracture with routine healing: Secondary | ICD-10-CM | POA: Diagnosis not present

## 2023-04-26 DIAGNOSIS — I10 Essential (primary) hypertension: Secondary | ICD-10-CM | POA: Diagnosis not present

## 2023-04-26 LAB — RENAL FUNCTION PANEL
Albumin: 2.8 g/dL — ABNORMAL LOW (ref 3.5–5.0)
Anion gap: 10 (ref 5–15)
BUN: 8 mg/dL (ref 8–23)
CO2: 22 mmol/L (ref 22–32)
Calcium: 8.7 mg/dL — ABNORMAL LOW (ref 8.9–10.3)
Chloride: 105 mmol/L (ref 98–111)
Creatinine, Ser: 0.81 mg/dL (ref 0.44–1.00)
GFR, Estimated: >60 mL/min (ref >60)
Glucose, Bld: 107 mg/dL — ABNORMAL HIGH (ref 70–99)
Phosphorus: 4 mg/dL (ref 2.5–4.6)
Potassium: 3.8 mmol/L (ref 3.5–5.1)
Sodium: 137 mmol/L (ref 135–145)

## 2023-04-26 LAB — CBC
HCT: 28 % — ABNORMAL LOW (ref 36.0–46.0)
Hemoglobin: 8.4 g/dL — ABNORMAL LOW (ref 12.0–15.0)
MCH: 26.3 pg (ref 26.0–34.0)
MCHC: 30 g/dL (ref 30.0–36.0)
MCV: 87.8 fL (ref 80.0–100.0)
Platelets: 458 10*3/uL — ABNORMAL HIGH (ref 150–400)
RBC: 3.19 MIL/uL — ABNORMAL LOW (ref 3.87–5.11)
RDW: 17.2 % — ABNORMAL HIGH (ref 11.5–15.5)
WBC: 11.6 10*3/uL — ABNORMAL HIGH (ref 4.0–10.5)
nRBC: 0 % (ref 0.0–0.2)

## 2023-04-26 LAB — GLUCOSE, CAPILLARY
Glucose-Capillary: 103 mg/dL — ABNORMAL HIGH (ref 70–99)
Glucose-Capillary: 106 mg/dL — ABNORMAL HIGH (ref 70–99)
Glucose-Capillary: 126 mg/dL — ABNORMAL HIGH (ref 70–99)
Glucose-Capillary: 124 mg/dL — ABNORMAL HIGH (ref 70–99)

## 2023-04-26 MED ORDER — ONDANSETRON HCL 4 MG PO TABS
4.0000 mg | ORAL_TABLET | Freq: Once | ORAL | Status: AC
  Administered 2023-04-26: 4 mg via ORAL
  Filled 2023-04-26: qty 1

## 2023-04-26 MED ORDER — LISINOPRIL 20 MG PO TABS
20.0000 mg | ORAL_TABLET | Freq: Every day | ORAL | Status: DC
  Administered 2023-04-26 – 2023-04-27 (×2): 20 mg via ORAL
  Filled 2023-04-26 (×2): qty 1

## 2023-04-26 MED ORDER — HYDROMORPHONE HCL 1 MG/ML IJ SOLN
1.0000 mg | Freq: Once | INTRAMUSCULAR | Status: AC
  Administered 2023-04-26: 1 mg via INTRAVENOUS
  Filled 2023-04-26: qty 1

## 2023-04-26 MED ORDER — RIVAROXABAN 10 MG PO TABS
10.0000 mg | ORAL_TABLET | Freq: Every day | ORAL | Status: DC
  Administered 2023-04-26 – 2023-05-01 (×5): 10 mg via ORAL
  Filled 2023-04-26 (×6): qty 1

## 2023-04-26 NOTE — Progress Notes (Signed)
 HD#12 SUBJECTIVE:  Patient Summary: Peggy House is a 63 y.o. with a pertinent PMH of hereditary hemorrhagic telangiectasia, chronic anemia, hypertension, hyperlipidemia, GI bleed, and type 2 diabetes , who presented with syncopal episode and admitted for symptomatic anemia and R ankle fracture.   Overnight Events: None.   Interim History: She is in pain this morning. Pain control was good yesterday but today her leg was accidentally bumped and so is requesting extra medicine. Otherwise she feels well. No other complaints. Remains in good spirits.  OBJECTIVE:  Vital Signs: Vitals:   04/26/23 0356 04/26/23 0815 04/26/23 0844 04/26/23 0846  BP: (!) 164/81 (!) 173/72 (!) 175/72 (!) 175/72  Pulse: 61 (!) 58 68   Resp: 17 18    Temp: 98.3 F (36.8 C) 98.4 F (36.9 C)    TempSrc: Oral Oral    SpO2: 99% 100%    Weight:      Height:       Supplemental O2: Room Air SpO2: 100 % O2 Flow Rate (L/min): 2 L/min  Filed Weights   04/13/23 0313 04/24/23 1423  Weight: 104.1 kg 102.5 kg     Intake/Output Summary (Last 24 hours) at 04/26/2023 1048 Last data filed at 04/26/2023 0500 Gross per 24 hour  Intake --  Output 450 ml  Net -450 ml   Net IO Since Admission: -5,349.13 mL [04/26/23 1048]  Physical Exam: Physical Exam Constitutional:      Appearance: She is not ill-appearing.  Cardiovascular:     Rate and Rhythm: Normal rate.  Pulmonary:     Effort: Pulmonary effort is normal. CTABL. Auscultated front and back. Musculoskeletal:     Comments: R ankle in soft cast. R leg/foot is neurovascularly inact. Toes are warm and able to move.   Skin:    General: Skin is warm.  Neurological:     Mental Status: She is alert.  Psychiatric:        Mood and Affect: Mood normal.        Behavior: Behavior normal.   Patient Lines/Drains/Airways Status     Active Line/Drains/Airways     Name Placement date Placement time Site Days   Implanted Port 07/08/18 Right Chest 07/08/18   --  Chest  1753   External Urinary Catheter 04/22/23  1000  --  4   Incision (Closed) 04/24/23 Ankle Right 04/24/23  1747  -- 2   Pressure Injury 04/22/23 Buttocks Right;Left Stage 2 -  Partial thickness loss of dermis presenting as a shallow open injury with a red, pink wound bed without slough.  04/22/23  1000  -- 4             ASSESSMENT/PLAN:  Assessment: Principal Problem:   Closed right trimalleolar fracture Active Problems:   Acute on chronic blood loss anemia   Anxiety   Essential hypertension   Obesity, Class III, BMI 40-49.9 (morbid obesity) (HCC)   Cough   Type 2 diabetes mellitus with vascular disease (HCC)   Syncope   HHT (hereditary hemorrhagic telangiectasia) (HCC)   Thrombocytopenia (HCC)  Peggy House is a 63 y.o. woman with a PMH of hereditary hemorrhagic telangiectasia, chronic iron  deficiency anemia, type 2 diabetes, tobacco use disorder, GAD who presented following a syncopal episode and admitted for symptomatic anemia and R ankle fracture.   Medical and surgical workup is complete. She is awaiting discharge to SNF pending bed offer.  Plan: Trimalleolar R Ankle Fracture Ankle fracture due to fall/syncope. Orthopedics now finished, she is  s/p closed reduction Sunday 12/29 and ORIF on 1/9. Monitoring pain. Soft cast intact, toes warm and neurovascularly intact.  - S/p ORIF 1/9. It went well. NWB RLE for 8-12 weeks. Follow up scheduled 14 days. - Scheduled Tylenol  1 g 3 times daily, oxycodone  10mg  q4h prn. IV okay for breakthrough. For bowel regimen, Senna-docusate BID, Miralax  BID. Last BM 1/9. - Elevate leg, ice packs   Symptomatic anemia Hereditary hemorrhagic telangiectasia Stable. Goal to keep Hg above 8, at goal, s/p 6u PRBCs sporadically over this hospitalization.  Given HHT, has history of multiple bleeding events including GI, but denies any now.  Bleeding primarily is epistaxis, twice since here controlled with pressure. As outpatient, she  requires transfusions weekly and reportedly difficult to maintain Hg above 8. - Monitor Hg, attempt to keep above 8. Monitor for signs of bleeding.  - For recurrent nosebleeds, attempt control with cold packs and pressure. Silver  nitrate can be tried if source of bleeding evident. If persistent, consider ENT for hemostasis.    Syncope due to acute on chronic blood loss anemia (resolved) Most likely due to acute anemia at admission (Hg 5). - Echo without valvular disease. Grade 2 diastolic dysfunction.  HTN Bps trending high. Held home regimen in setting of fall. Will resume amlodipine  10. Still hold lisinopril  20.   URI, cough (resolving) Not hypoxic.  Reported dyspnea and a new cough a few days ago but getting better.  Lung exam and CXR unrevealing.  Treating with guaifenesin  + dextromethorphan  prn.   Type 2 diabetes Last A1c 02/01/2023, 5.0.  She seems to be well-controlled for many years.  - Metformin  held at admission, can discontinue given low A1c - CBG   Tobacco use disorder Half pack a day smoker. -Can give nicotine  patch if requested. Was held at admission due to surgery.  -No PFTs on file to formally diagnose COPD, resume home albuterol    GAD Resume PTA trazodone  50prn, Celexa  20qd. Remeron  7.5qd held at admission.    Best Practice: Diet: Cardiac diet IVF: None VTE: DOAC Code: DNR/DNI AB: None DISPO: Anticipated discharge in 2-3 days to home vs SNF pending Bed availability.  Signature: Lonni Africa, D.O.  Internal Medicine Resident, PGY-1 [name]  Pager: [phone] 10:48 AM, 04/26/2023   Please contact the on call pager after 5 pm and on weekends at [phone].

## 2023-04-26 NOTE — Plan of Care (Signed)

## 2023-04-27 LAB — GLUCOSE, CAPILLARY
Glucose-Capillary: 113 mg/dL — ABNORMAL HIGH (ref 70–99)
Glucose-Capillary: 121 mg/dL — ABNORMAL HIGH (ref 70–99)
Glucose-Capillary: 122 mg/dL — ABNORMAL HIGH (ref 70–99)
Glucose-Capillary: 114 mg/dL — ABNORMAL HIGH (ref 70–99)

## 2023-04-27 LAB — CBC
HCT: 23.5 % — ABNORMAL LOW (ref 36.0–46.0)
HCT: 24.6 % — ABNORMAL LOW (ref 36.0–46.0)
Hemoglobin: 6.9 g/dL — CL (ref 12.0–15.0)
Hemoglobin: 7.5 g/dL — ABNORMAL LOW (ref 12.0–15.0)
MCH: 26.2 pg (ref 26.0–34.0)
MCH: 26.9 pg (ref 26.0–34.0)
MCHC: 29.4 g/dL — ABNORMAL LOW (ref 30.0–36.0)
MCHC: 30.5 g/dL (ref 30.0–36.0)
MCV: 89.4 fL (ref 80.0–100.0)
MCV: 88.2 fL (ref 80.0–100.0)
Platelets: 461 10*3/uL — ABNORMAL HIGH (ref 150–400)
Platelets: 490 10*3/uL — ABNORMAL HIGH (ref 150–400)
RBC: 2.63 MIL/uL — ABNORMAL LOW (ref 3.87–5.11)
RBC: 2.79 MIL/uL — ABNORMAL LOW (ref 3.87–5.11)
RDW: 17.4 % — ABNORMAL HIGH (ref 11.5–15.5)
RDW: 17.9 % — ABNORMAL HIGH (ref 11.5–15.5)
WBC: 12 10*3/uL — ABNORMAL HIGH (ref 4.0–10.5)
WBC: 12.2 10*3/uL — ABNORMAL HIGH (ref 4.0–10.5)
nRBC: 0 % (ref 0.0–0.2)
nRBC: 0 % (ref 0.0–0.2)

## 2023-04-27 LAB — PREPARE RBC (CROSSMATCH)

## 2023-04-27 MED ORDER — SODIUM CHLORIDE 0.9% IV SOLUTION: Freq: Once | INTRAVENOUS | Status: AC

## 2023-04-27 MED ORDER — SODIUM CHLORIDE 0.9% IV SOLUTION: Freq: Once | INTRAVENOUS | Status: DC

## 2023-04-27 NOTE — Plan of Care (Signed)

## 2023-04-27 NOTE — Progress Notes (Signed)
 Critcal hgb called by lab of 6.9.  Dr. Allena Katz made aware, orders received to transfer one unit.  Type and screen must be redrawn at this time.  Will cont to monitor.

## 2023-04-27 NOTE — Progress Notes (Addendum)
 HD#13 SUBJECTIVE:  Patient Summary: Peggy House is a 63 y.o. with a pertinent PMH of hereditary hemorrhagic telangiectasia, chronic anemia, hypertension, hyperlipidemia, GI bleed, and type 2 diabetes , who presented with syncopal episode and admitted for symptomatic anemia and R ankle fracture.   Overnight Events: Hb found to be 6.9, likely in the setting of nose bleed over night w/ hx of HHT, one unit of PRBcs ordered.  Interim History: Pt seen bedside AM, states she is doing well and her pain is a lot more in control.    OBJECTIVE:  Vital Signs: Vitals:   04/26/23 1620 04/26/23 1941 04/27/23 0353 04/27/23 0741  BP: (!) 158/65 109/60 98/63 (!) 110/59  Pulse: 63 66 (!) 57 65  Resp:  17 18 18   Temp: 98.3 F (36.8 C) 98.7 F (37.1 C) 98.5 F (36.9 C) 98.7 F (37.1 C)  TempSrc: Oral Oral Oral Oral  SpO2: 99% 98% 96% 97%  Weight:      Height:       Supplemental O2: Room Air SpO2: 97 % O2 Flow Rate (L/min): 2 L/min  Filed Weights   04/13/23 0313 04/24/23 1423  Weight: 104.1 kg 102.5 kg     Intake/Output Summary (Last 24 hours) at 04/27/2023 9071 Last data filed at 04/27/2023 9490 Gross per 24 hour  Intake --  Output 200 ml  Net -200 ml   Net IO Since Admission: -5,549.13 mL [04/27/23 0928]  Physical Exam: Physical Exam Constitutional:      Appearance: She is not ill-appearing.  Cardiovascular:     Rate and Rhythm: Normal rate.  Pulmonary:     Effort: Pulmonary effort is normal. CTABL. Auscultated front and back. Musculoskeletal:     Comments: R ankle in soft cast. R leg/foot is neurovascularly inact. Toes are warm and able to move.   Skin:    General: Skin is warm.  Neurological:     Mental Status: She is alert.  Psychiatric:        Mood and Affect: Mood normal.        Behavior: Behavior normal.   Patient Lines/Drains/Airways Status     Active Line/Drains/Airways     Name Placement date Placement time Site Days   Implanted Port 07/08/18 Right  Chest 07/08/18  --  Chest  1753   External Urinary Catheter 04/22/23  1000  --  4   Incision (Closed) 04/24/23 Ankle Right 04/24/23  1747  -- 2   Pressure Injury 04/22/23 Buttocks Right;Left Stage 2 -  Partial thickness loss of dermis presenting as a shallow open injury with a red, pink wound bed without slough.  04/22/23  1000  -- 4             ASSESSMENT/PLAN:  Assessment: Principal Problem:   Closed right trimalleolar fracture Active Problems:   Acute on chronic blood loss anemia   Anxiety   Essential hypertension   Obesity, Class III, BMI 40-49.9 (morbid obesity) (HCC)   Cough   Type 2 diabetes mellitus with vascular disease (HCC)   Syncope   HHT (hereditary hemorrhagic telangiectasia) (HCC)   Thrombocytopenia (HCC)  Peggy House is a 63 y.o. woman with a PMH of hereditary hemorrhagic telangiectasia, chronic iron  deficiency anemia, type 2 diabetes, tobacco use disorder, GAD who presented following a syncopal episode and admitted for symptomatic anemia and R ankle fracture.   Medical and surgical workup is complete. She is awaiting discharge to SNF pending bed offer.  Plan: Trimalleolar R Ankle Fracture  Ankle fracture due to fall/syncope. Orthopedics now finished, she is s/p closed reduction Sunday 12/29 and ORIF on 1/9. Monitoring pain. Soft cast intact, toes warm and neurovascularly intact. She is currently pending SNF placement, and no bed offers as of yet.   Plan:  - S/p ORIF 1/9. It went well. NWB RLE for 8-12 weeks. Follow up scheduled 14 days. - Scheduled Tylenol  1 g 3 times daily, oxycodone  10mg  q4h prn.  For bowel regimen, Senna-docusate BID, Miralax  BID. Last BM 1/9. - Elevate leg, ice packs   Symptomatic anemia Hereditary hemorrhagic telangiectasia Stable. Goal to keep Hg above 8, at goal, s/p 7u PRBCs sporadically over this hospitalization.  Overnight, her Hb decreased to 6.9, she is getting a unit today. Given HHT, has history of multiple bleeding  events including GI, but denies any now.  Bleeding primarily is epistaxis, three since here controlled with pressure. As outpatient, she requires transfusions weekly and reportedly difficult to maintain Hg above 8. - Monitor Hg, attempt to keep above 8. Monitor for signs of bleeding.  - For recurrent nosebleeds, attempt control with cold packs and pressure. Silver  nitrate can be tried if source of bleeding evident. If persistent, consider ENT for hemostasis.    Syncope due to acute on chronic blood loss anemia (resolved) Most likely due to acute anemia at admission (Hg 5). - Echo without valvular disease. Grade 2 diastolic dysfunction.  HTN Bps trending high. Held home regimen in setting of fall. Will resume amlodipine  10. Still hold lisinopril  20.   URI, cough (resolving) Not hypoxic.  Reported dyspnea and a new cough a few days ago but getting better.  Lung exam and CXR unrevealing.  Treating with guaifenesin  + dextromethorphan  prn.   Type 2 diabetes Last A1c 02/01/2023, 5.0.  She seems to be well-controlled for many years.  - Metformin  held at admission, can discontinue given low A1c - CBG   Tobacco use disorder Half pack a day smoker. -Can give nicotine  patch if requested. Was held at admission due to surgery.  -No PFTs on file to formally diagnose COPD, resume home albuterol    GAD Resume PTA trazodone  50prn, Celexa  20qd. Remeron  7.5qd held at admission.    Best Practice: Diet: Cardiac diet IVF: None VTE: DOAC Code: DNR/DNI AB: None DISPO: Anticipated discharge in 2-3 days to home vs SNF pending Bed availability.  Signature: Dellanira Dillow, MD Internal Medicine Resident, PGY-2 [name]  Pager: [phone] 9:28 AM, 04/27/2023   Please contact the on call pager after 5 pm and on weekends at [phone].

## 2023-04-28 ENCOUNTER — Encounter (HOSPITAL_COMMUNITY): Payer: Self-pay | Admitting: Orthopedic Surgery

## 2023-04-28 DIAGNOSIS — D649 Anemia, unspecified: Secondary | ICD-10-CM | POA: Diagnosis not present

## 2023-04-28 DIAGNOSIS — I780 Hereditary hemorrhagic telangiectasia: Secondary | ICD-10-CM | POA: Diagnosis not present

## 2023-04-28 DIAGNOSIS — S82851D Displaced trimalleolar fracture of right lower leg, subsequent encounter for closed fracture with routine healing: Secondary | ICD-10-CM | POA: Diagnosis not present

## 2023-04-28 LAB — CBC WITH DIFFERENTIAL/PLATELET
Abs Immature Granulocytes: 0.07 10*3/uL (ref 0.00–0.07)
Basophils Absolute: 0 10*3/uL (ref 0.0–0.1)
Basophils Relative: 0 %
Eosinophils Absolute: 0.2 10*3/uL (ref 0.0–0.5)
Eosinophils Relative: 2 %
HCT: 24.3 % — ABNORMAL LOW (ref 36.0–46.0)
Hemoglobin: 7.3 g/dL — ABNORMAL LOW (ref 12.0–15.0)
Immature Granulocytes: 1 %
Lymphocytes Relative: 15 %
Lymphs Abs: 1.7 10*3/uL (ref 0.7–4.0)
MCH: 26.4 pg (ref 26.0–34.0)
MCHC: 30 g/dL (ref 30.0–36.0)
MCV: 87.7 fL (ref 80.0–100.0)
Monocytes Absolute: 0.8 10*3/uL (ref 0.1–1.0)
Monocytes Relative: 7 %
Neutro Abs: 8.6 10*3/uL — ABNORMAL HIGH (ref 1.7–7.7)
Neutrophils Relative %: 75 %
Platelets: 493 10*3/uL — ABNORMAL HIGH (ref 150–400)
RBC: 2.77 MIL/uL — ABNORMAL LOW (ref 3.87–5.11)
RDW: 17.9 % — ABNORMAL HIGH (ref 11.5–15.5)
WBC: 11.3 10*3/uL — ABNORMAL HIGH (ref 4.0–10.5)
nRBC: 0.2 % (ref 0.0–0.2)

## 2023-04-28 LAB — BASIC METABOLIC PANEL
Anion gap: 8 (ref 5–15)
BUN: 37 mg/dL — ABNORMAL HIGH (ref 8–23)
CO2: 24 mmol/L (ref 22–32)
Calcium: 8.1 mg/dL — ABNORMAL LOW (ref 8.9–10.3)
Chloride: 106 mmol/L (ref 98–111)
Creatinine, Ser: 1.52 mg/dL — ABNORMAL HIGH (ref 0.44–1.00)
GFR, Estimated: 39 mL/min — ABNORMAL LOW (ref >60)
Glucose, Bld: 112 mg/dL — ABNORMAL HIGH (ref 70–99)
Potassium: 3.8 mmol/L (ref 3.5–5.1)
Sodium: 138 mmol/L (ref 135–145)

## 2023-04-28 LAB — GLUCOSE, CAPILLARY
Glucose-Capillary: 107 mg/dL — ABNORMAL HIGH (ref 70–99)
Glucose-Capillary: 137 mg/dL — ABNORMAL HIGH (ref 70–99)

## 2023-04-28 LAB — PREPARE RBC (CROSSMATCH)

## 2023-04-28 MED ORDER — BOOST / RESOURCE BREEZE PO LIQD CUSTOM
1.0000 | Freq: Three times a day (TID) | ORAL | Status: DC
  Administered 2023-04-28 – 2023-05-02 (×12): 1 via ORAL

## 2023-04-28 MED ORDER — LACTATED RINGERS IV BOLUS
1000.0000 mL | Freq: Once | INTRAVENOUS | Status: AC
  Administered 2023-04-28: 1000 mL via INTRAVENOUS

## 2023-04-28 MED ORDER — SODIUM CHLORIDE 0.9% IV SOLUTION
Freq: Once | INTRAVENOUS | Status: AC
Start: 2023-04-28 — End: 2023-04-28

## 2023-04-28 NOTE — Progress Notes (Signed)
 Attempted to administer blood transfusion to pt. Pt stated that she will receive it later tonight. MD notified.

## 2023-04-28 NOTE — Progress Notes (Signed)
 Inpatient Rehab Admissions Coordinator Note:   Per updated therapy recommendations patient was screened for CIR candidacy by Reche FORBES Lowers, PT. At this time, pt appears to be a potential candidate for CIR. I will place an order for rehab consult for full assessment, per our protocol.  Please contact me any with questions.SABRA Reche Lowers, PT, DPT [phone] 04/28/23 4:24 PM

## 2023-04-28 NOTE — Progress Notes (Signed)
 Patient Peggy House is 7.5 and she requested to hold off on a unit of blood for tonight. I sent Dr. Geraldo Pitter a secure text to make him aware of her wishes.

## 2023-04-28 NOTE — Plan of Care (Signed)

## 2023-04-28 NOTE — Progress Notes (Signed)
 HD#14 SUBJECTIVE:  Patient Summary: Peggy House is a 63 y.o. with a pertinent PMH of hereditary hemorrhagic telangiectasia, chronic anemia, hypertension, hyperlipidemia, GI bleed, and type 2 diabetes , who presented with syncopal episode and admitted for symptomatic anemia and R ankle fracture.   Overnight Events: None.  Declined transfusion of red blood cells to bring in hemoglobin >8.  Interim History: Pt seen bedside, denies any nosebleeds overnight.  Also denies shortness of breath or chest pain.  She is amenable to transfusion this afternoon.  OBJECTIVE:  Vital Signs: Vitals:   04/27/23 1600 04/27/23 2039 04/28/23 0340 04/28/23 0837  BP: (!) 92/53 110/69 101/60 110/61  Pulse: 60 (!) 59 61 65  Resp: 18 20 20 18   Temp: 97.6 F (36.4 C) 98.1 F (36.7 C) 98.1 F (36.7 C) 98.2 F (36.8 C)  TempSrc:  Oral Oral Oral  SpO2: 94% 99% 95% 97%  Weight:      Height:       Supplemental O2: Room Air SpO2: 97 % O2 Flow Rate (L/min): 2 L/min  Filed Weights   04/13/23 0313 04/24/23 1423  Weight: 104.1 kg 102.5 kg     Intake/Output Summary (Last 24 hours) at 04/28/2023 1553 Last data filed at 04/28/2023 1412 Gross per 24 hour  Intake 240 ml  Output 1050 ml  Net -810 ml   Net IO Since Admission: -5,556.63 mL [04/28/23 1553]  Physical Exam: Physical Exam Constitutional:      Appearance: She is not ill-appearing.  Cardiovascular:     Rate and Rhythm: Normal rate.  Pulmonary:     Effort: Pulmonary effort is normal. CTABL. Auscultated front and back. Musculoskeletal:     Comments: R ankle in soft cast. R leg/foot is neurovascularly inact. Toes are warm and able to move.   Skin:    General: Skin is warm.  Neurological:     Mental Status: She is alert.  Psychiatric:        Mood and Affect: Mood normal.        Behavior: Behavior normal.   Patient Lines/Drains/Airways Status     Active Line/Drains/Airways     Name Placement date Placement time Site Days    Implanted Port 07/08/18 Right Chest 07/08/18  --  Chest  1753   External Urinary Catheter 04/22/23  1000  --  4   Incision (Closed) 04/24/23 Ankle Right 04/24/23  1747  -- 2   Pressure Injury 04/22/23 Buttocks Right;Left Stage 2 -  Partial thickness loss of dermis presenting as a shallow open injury with a red, pink wound bed without slough.  04/22/23  1000  -- 4             ASSESSMENT/PLAN:  Assessment: Principal Problem:   Closed right trimalleolar fracture Active Problems:   Acute on chronic blood loss anemia   Anxiety   Essential hypertension   Obesity, Class III, BMI 40-49.9 (morbid obesity) (HCC)   Cough   Type 2 diabetes mellitus with vascular disease (HCC)   Syncope   HHT (hereditary hemorrhagic telangiectasia) (HCC)   Thrombocytopenia (HCC)  Peggy House is a 63 y.o. woman with a PMH of hereditary hemorrhagic telangiectasia, chronic iron  deficiency anemia, type 2 diabetes, tobacco use disorder, GAD who presented following a syncopal episode and admitted for symptomatic anemia and R ankle fracture.   Medical and surgical workup is complete. She is awaiting discharge to CIR.  Plan:  Symptomatic anemia Hereditary hemorrhagic telangiectasia No recent nosebleeds, Hb stable at 7.3.  Hb goal> 8, declined transfusion of packed red blood cell this morning, she states that because she thought the transfusion has been too much.  I thoroughly explained why she has needed transfusion, in addition to a hemoglobin goal.  Patient is open to getting blood transfusion this afternoon.  - CBC -Keep Hb >8. - For recurrent nosebleeds, attempt control with cold packs and pressure. Silver  nitrate can be tried if source of bleeding evident. If persistent, consider ENT for hemostasis.   Trimalleolar R Ankle Fracture Stable, she endorses adequate pain control.  Per Ortho, nonweightbearing right lower extremity for 8-12 weeks.  She has a follow-up scheduled in 2 weeks.  Plan:  -  Scheduled Tylenol  1 g 3 times daily, oxycodone  10mg  q4h prn.  For bowel regimen, Senna-docusate BID, Miralax  BID. Last BM 1/9. - Elevate leg, ice packs -Follow-up with child psychotherapist.   HTN BP 101/60.  Amlodipine  was resumed yesterday, still holding lisinopril  20 mg.  -Monitor. -Hold lisinopril  20 mg.   Type 2 diabetes Last A1c 02/01/2023, 5.0.  She seems to be well-controlled for many years.  - Metformin  held at admission, can discontinue given low A1c - CBG   Tobacco use disorder Half pack a day smoker. -Can give nicotine  patch if requested. Was held at admission due to surgery.  -No PFTs on file to formally diagnose COPD, resume home albuterol    GAD - Resume PTA trazodone  50prn, Celexa  20qd. Remeron  7.5qd held at admission.    Best Practice: Diet: Cardiac diet IVF: None VTE: DOAC Code: DNR/DNI AB: None DISPO: Anticipated discharge in 2-3 days to home vs SNF pending Bed availability.  Signature: Saad Nooruddin, MD Internal Medicine Resident, PGY-2 [name]  Pager: [phone] 3:53 PM, 04/28/2023   Please contact the on call pager after 5 pm and on weekends at [phone].

## 2023-04-28 NOTE — Progress Notes (Signed)
 Nutrition Follow-up  DOCUMENTATION CODES:   Morbid obesity  INTERVENTION:  Continue with regular diet Continue to encourage oral intake D/s boost plus and ensure Boost Breeze po TID, each supplement provides 250 kcal and 9 grams of protein Magic cup BID with meals, each supplement provides 290 kcal and 9 grams of protein    NUTRITION DIAGNOSIS:   Increased nutrient needs related to chronic illness as evidenced by estimated needs.    GOAL:   Patient will meet greater than or equal to 90% of their needs    MONITOR:   PO intake  REASON FOR ASSESSMENT:   Malnutrition Screening Tool    ASSESSMENT:   63 y.o. f, presented form home with complaints of syncopal episode. Admitted with symptomatic anemia, closed right trimalleolar fracture. PMH: hereditary hemorrhagic telangiectasia, chronic anemia, HTN, HLD, GI bleed, DM2, GERD, MDD. Pt son leaving room at time of visit. Pt stated that her appetite continues to be poor. She stated that she takes a few bites then id does not have any flavor and she just does not want it anymore. She reports about 25% of her meal being consumed.  Does not want the ensure or the chocolate boost,  She enjoys Boost breeze,  Discussed magic cups and would like to try these.   Admit weight: 104.1 kg Current weight: 102.5 kg    Average Meal Intake: No recent documentation  Nutritionally Relevant Medications: Scheduled Meds:  aminocaproic  acid  1 g Oral Q12H   amLODipine   10 mg Oral Daily   feeding supplement  237 mL Oral BID BM   lactose free nutrition  237 mL Oral TID WC   senna-docusate  1 tablet Oral BID    Labs Reviewed    NUTRITION - FOCUSED PHYSICAL EXAM:  Flowsheet Row Most Recent Value  Orbital Region No depletion  Upper Arm Region No depletion  Thoracic and Lumbar Region No depletion  Buccal Region No depletion  Temple Region No depletion  Clavicle Bone Region No depletion  Clavicle and Acromion Bone Region No depletion   Scapular Bone Region No depletion  Dorsal Hand No depletion  Patellar Region No depletion  Anterior Thigh Region No depletion  Posterior Calf Region No depletion  Edema (RD Assessment) Mild  Hair Reviewed  Eyes Reviewed  Mouth Reviewed  Skin Reviewed  Nails Reviewed       Diet Order:   Diet Order             Diet regular Room service appropriate? Yes; Fluid consistency: Thin  Diet effective now                   EDUCATION NEEDS:   No education needs have been identified at this time  Skin:  Skin Assessment: Reviewed RN Assessment Skin Integrity Issues:: Stage II Stage II: left buttocks  Last BM:  1/13 type 6  Height:   Ht Readings from Last 1 Encounters:  04/24/23 5' 4.5 (1.638 m)    Weight:   Wt Readings from Last 1 Encounters:  04/24/23 102.5 kg    Ideal Body Weight:     BMI:  Body mass index is 38.19 kg/m.  Estimated Nutritional Needs:   Kcal:  1575-1830 kcal  Protein:  70-80 g  Fluid:  79ml/kcal    Jenna Pew RDN, LDN Clinical Dietitian   If unable to reach, please contact RD Inpatient secure chat group between 8 am-4 pm daily

## 2023-04-28 NOTE — Progress Notes (Signed)
 Physical Therapy Treatment Patient Details Name: Peggy House MRN: [id number] DOB: 08-31-1960 Today's Date: 04/28/2023   History of Present Illness 63 y.o. female Presented with syncopal episode and admitted for symptomatic anemia and R ankle fracture, external reduction performed 12/29. Pt underwent rt ankle ORIF on 04/24/23.  PMH includes: hereditary Hemorrhagic Telangiectasia, blood loss anemia, MDD, GAD, arthritis, HTN, obesity, DM2.    PT Comments  Good progress towards functional goals, required min assist for RLE support with lateral scoot (slight squat) pivot transfer from bed to drop arm chair. Requires cues for techniques and extra time but follows directions well and utilized LLE and UEs to boost across. Progressed with sit to stand training from recliner, mod assist +2 for boost with RW for support, tolerated WB through LLE for 40 seconds, heavy reliance on RW for support, some assist for RLE to float and maintain NWB. Reviewed and progressed LE exercises, encouraged performance between therapy visits and OOB with staff daily. I feel she would progress quickly with daily inpatient rehab, and can d/c home at a higher level of independence with wheelchair following adequate training. Possibly progress to a stand pivot transfer as well. Patient will benefit from intensive inpatient follow up therapy, >3 hours/day. Patient will continue to benefit from skilled physical therapy services to further improve independence with functional mobility.     If plan is discharge home, recommend the following: A lot of help with walking and/or transfers;A lot of help with bathing/dressing/bathroom;Assist for transportation   Can travel by private vehicle     No  Equipment Recommendations  Wheelchair (measurements PT);Wheelchair cushion (measurements PT);BSC/3in1 (drop arm commode, 18 w/c with removable armrests and elevating leg rests)    Recommendations for Other Services Rehab consult      Precautions / Restrictions Precautions Precautions: Fall Restrictions Weight Bearing Restrictions Per Provider Order: Yes RLE Weight Bearing Per Provider Order: Non weight bearing     Mobility  Bed Mobility Overal bed mobility: Needs Assistance Bed Mobility: Supine to Sit   Sidelying to sit: Used rails, Min assist, HOB elevated       General bed mobility comments: Slow and effortful to rise. HOB slightly elevated, performed 75% for transition by herself with therapist allowing pt to pull through hand to scoot to EOB and squre herself at the end.    Transfers Overall transfer level: Needs assistance Equipment used: Rolling walker (2 wheels), None Transfers: Sit to/from Stand, Bed to chair/wheelchair/BSC Sit to Stand: Mod assist, +2 physical assistance          Lateral/Scoot Transfers: Min assist General transfer comment: Performed lateral scoot from bed to recliner with min assist to support RLE. Educated on techniques and progressed nicely with ability to use UEs to boost hips off bed and perform lateral scoots with some clearance. Requires extra time and is effortful but with some cues for technique and support with RLE pt performed successfully. Progressed with sit to stand using RW, cues for hand placement and technique, required Mod assist +2 from reclining chair. Tolerated approx 40 seconds  and took 2 small hops backwards, therapist supported RLE to prevent WB.    Ambulation/Gait               General Gait Details: unable   Stairs             Wheelchair Mobility     Tilt Bed    Modified Rankin (Stroke Patients Only)       Balance Overall balance assessment:  Needs assistance Sitting-balance support: No upper extremity supported, Feet supported Sitting balance-Leahy Scale: Good Sitting balance - Comments: EOB no assist. maintains NWB on Rt   Standing balance support: Bilateral upper extremity supported, Reliant on assistive device for  balance Standing balance-Leahy Scale: Poor Standing balance comment: Stands up to 40 seconds, heavy support on RW, took two small hops backwards with min assist +2 for safety.                            Cognition Arousal: Alert Behavior During Therapy: WFL for tasks assessed/performed Overall Cognitive Status: Within Functional Limits for tasks assessed                                          Exercises General Exercises - Lower Extremity Ankle Circles/Pumps: AROM, Left, 10 reps, Supine Quad Sets: Strengthening, Both, 10 reps, Supine Gluteal Sets: Strengthening, Both, 10 reps, Supine Long Arc Quad: AROM, Right, Seated, 5 reps Hip ABduction/ADduction: Strengthening, Both, 10 reps, Supine Straight Leg Raises: Strengthening, Both, 10 reps, Supine Hip Flexion/Marching: AROM, Right, 10 reps, Seated    General Comments General comments (skin integrity, edema, etc.): Educated on precautions, weight shifting, limited time in chair to 1.5 hr for skin preservation. OOB several times per day with staff as tolerated.      Pertinent Vitals/Pain Pain Assessment Pain Assessment: Faces Faces Pain Scale: Hurts even more Pain Location: R ankle with movement Pain Descriptors / Indicators: Throbbing Pain Intervention(s): Monitored during session, Repositioned, Patient requesting pain meds-RN notified    Home Living                          Prior Function            PT Goals (current goals can now be found in the care plan section) Acute Rehab PT Goals Patient Stated Goal: go home PT Goal Formulation: With patient Time For Goal Achievement: 05/02/23 Potential to Achieve Goals: Good Progress towards PT goals: Progressing toward goals    Frequency    Min 1X/week      PT Plan      Co-evaluation              AM-PAC PT 6 Clicks Mobility   Outcome Measure  Help needed turning from your back to your side while in a flat bed without  using bedrails?: None Help needed moving from lying on your back to sitting on the side of a flat bed without using bedrails?: A Little Help needed moving to and from a bed to a chair (including a wheelchair)?: A Little Help needed standing up from a chair using your arms (e.g., wheelchair or bedside chair)?: A Lot Help needed to walk in hospital room?: Total Help needed climbing 3-5 steps with a railing? : Total 6 Click Score: 14    End of Session Equipment Utilized During Treatment: Gait belt Activity Tolerance: Patient tolerated treatment well Patient left: in chair;with call bell/phone within reach;with chair alarm set Nurse Communication: Mobility status (Lateral scoot to bed) PT Visit Diagnosis: Unsteadiness on feet (R26.81);Other abnormalities of gait and mobility (R26.89);Pain Pain - Right/Left: Right Pain - part of body: Ankle and joints of foot     Time: 1434-1455 PT Time Calculation (min) (ACUTE ONLY): 21 min  Charges:    $Therapeutic Activity:  8-22 mins PT General Charges $$ ACUTE PT VISIT: 1 Visit                     Leontine Roads, PT, DPT [facility]  Rehabilitation Services Physical Therapist Office: [phone] Website: [email or web address]    [name] 04/28/2023, 3:52 PM

## 2023-04-29 DIAGNOSIS — D62 Acute posthemorrhagic anemia: Secondary | ICD-10-CM | POA: Diagnosis not present

## 2023-04-29 DIAGNOSIS — I780 Hereditary hemorrhagic telangiectasia: Secondary | ICD-10-CM | POA: Diagnosis not present

## 2023-04-29 DIAGNOSIS — E119 Type 2 diabetes mellitus without complications: Secondary | ICD-10-CM | POA: Diagnosis not present

## 2023-04-29 DIAGNOSIS — I10 Essential (primary) hypertension: Secondary | ICD-10-CM | POA: Diagnosis not present

## 2023-04-29 DIAGNOSIS — S82851D Displaced trimalleolar fracture of right lower leg, subsequent encounter for closed fracture with routine healing: Secondary | ICD-10-CM | POA: Diagnosis not present

## 2023-04-29 DIAGNOSIS — Z7984 Long term (current) use of oral hypoglycemic drugs: Secondary | ICD-10-CM | POA: Diagnosis not present

## 2023-04-29 DIAGNOSIS — D649 Anemia, unspecified: Secondary | ICD-10-CM | POA: Diagnosis not present

## 2023-04-29 LAB — CBC
HCT: 23 % — ABNORMAL LOW (ref 36.0–46.0)
Hemoglobin: 7 g/dL — ABNORMAL LOW (ref 12.0–15.0)
MCH: 26.8 pg (ref 26.0–34.0)
MCHC: 30.4 g/dL (ref 30.0–36.0)
MCV: 88.1 fL (ref 80.0–100.0)
Platelets: 469 10*3/uL — ABNORMAL HIGH (ref 150–400)
RBC: 2.61 MIL/uL — ABNORMAL LOW (ref 3.87–5.11)
RDW: 17.6 % — ABNORMAL HIGH (ref 11.5–15.5)
WBC: 10.5 10*3/uL (ref 4.0–10.5)
nRBC: 0 % (ref 0.0–0.2)

## 2023-04-29 LAB — HEMOGLOBIN AND HEMATOCRIT, BLOOD
HCT: 28.1 % — ABNORMAL LOW (ref 36.0–46.0)
Hemoglobin: 8.5 g/dL — ABNORMAL LOW (ref 12.0–15.0)

## 2023-04-29 LAB — BASIC METABOLIC PANEL
Anion gap: 6 (ref 5–15)
BUN: 28 mg/dL — ABNORMAL HIGH (ref 8–23)
CO2: 26 mmol/L (ref 22–32)
Calcium: 8.4 mg/dL — ABNORMAL LOW (ref 8.9–10.3)
Chloride: 109 mmol/L (ref 98–111)
Creatinine, Ser: 1.08 mg/dL — ABNORMAL HIGH (ref 0.44–1.00)
GFR, Estimated: 58 mL/min — ABNORMAL LOW (ref >60)
Glucose, Bld: 100 mg/dL — ABNORMAL HIGH (ref 70–99)
Potassium: 4.2 mmol/L (ref 3.5–5.1)
Sodium: 141 mmol/L (ref 135–145)

## 2023-04-29 LAB — GLUCOSE, CAPILLARY
Glucose-Capillary: 98 mg/dL (ref 70–99)
Glucose-Capillary: 135 mg/dL — ABNORMAL HIGH (ref 70–99)
Glucose-Capillary: 124 mg/dL — ABNORMAL HIGH (ref 70–99)
Glucose-Capillary: 106 mg/dL — ABNORMAL HIGH (ref 70–99)

## 2023-04-29 MED ORDER — DIPHENHYDRAMINE HCL 25 MG PO CAPS
25.0000 mg | ORAL_CAPSULE | Freq: Once | ORAL | Status: AC
  Administered 2023-04-29: 25 mg via ORAL
  Filled 2023-04-29: qty 1

## 2023-04-29 NOTE — Progress Notes (Signed)
 Inpatient Rehab Coordinator Note:  I met with patient at bedside to discuss CIR recommendations and goals/expectations of CIR stay.  We reviewed 3 hrs/day of therapy, physician follow up, and average length of stay 2 weeks (dependent upon progress) with goals of supervision to mod I, w/c level vs limited ambulation with RW.  She confirms that she lives with a son who works 12 hr shifts, but that her sister Peggy House can come stay with her some while he's out (if needed).  I reviewed need for insurance auth and she confirms [facility] as her payor.  We will need an updated OT note to proceed with auth and I will send it as soon as they have a chance to see her.    Reche Lowers, PT, DPT Admissions Coordinator [phone] 04/29/23  2:36 PM

## 2023-04-29 NOTE — Progress Notes (Signed)
 HD#15 SUBJECTIVE:  Patient Summary: Peggy House is a 63 y.o. with a pertinent PMH of hereditary hemorrhagic telangiectasia, chronic anemia, hypertension, hyperlipidemia, GI bleed, and type 2 diabetes , who presented with syncopal episode and admitted for symptomatic anemia and R ankle fracture.   Overnight Events: No acute event.  Interim History: Pt seen bedside, no acute complaints.  She had a bowel movement this morning, also worked with mobility specialist.  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    OBJECTIVE:  Vital Signs: Vitals:   04/28/23 0340 04/28/23 0837 04/28/23 1645 04/29/23 0817  BP: 101/60 110/61 (!) 109/55 (!) 115/51  Pulse: 61 65 (!) 56 (!) 53  Resp: 20 18 18 18   Temp: 98.1 F (36.7 C) 98.2 F (36.8 C) 98.7 F (37.1 C) 98.4 F (36.9 C)  TempSrc: Oral Oral    SpO2: 95% 97% 97% 100%  Weight:      Height:       Supplemental O2: Room Air SpO2: 100 % O2 Flow Rate (L/min): 2 L/min  Filed Weights   04/13/23 0313 04/24/23 1423  Weight: 104.1 kg 102.5 kg     Intake/Output Summary (Last 24 hours) at 04/29/2023 1451 Last data filed at 04/29/2023 1223 Gross per 24 hour  Intake 1001.75 ml  Output 550 ml  Net 451.75 ml   Net IO Since Admission: -5,104.88 mL [04/29/23 1451]  Physical Exam: Physical Exam Constitutional:      Appearance: She is not ill-appearing.  Cardiovascular:     Rate and Rhythm: Normal rate.  Pulmonary:     Effort: Pulmonary effort is normal. CTABL. Auscultated front and back. Musculoskeletal:     Comments: R ankle in soft cast. R leg/foot is neurovascularly inact. Toes are warm and able to move.   Skin:    General: Skin is warm.  Neurological:     Mental Status: She is alert.  Psychiatric:        Mood and Affect: Mood normal.        Behavior: Behavior normal.   Patient Lines/Drains/Airways Status     Active Line/Drains/Airways     Name Placement date Placement time Site Days   Implanted Port 07/08/18 Right Chest  07/08/18  --  Chest  1753   External Urinary Catheter 04/22/23  1000  --  4   Incision (Closed) 04/24/23 Ankle Right 04/24/23  1747  -- 2   Pressure Injury 04/22/23 Buttocks Right;Left Stage 2 -  Partial thickness loss of dermis presenting as a shallow open injury with a red, pink wound bed without slough.  04/22/23  1000  -- 4             ASSESSMENT/PLAN:  Assessment: Principal Problem:   Closed right trimalleolar fracture Active Problems:   Acute on chronic blood loss anemia   Anxiety   Essential hypertension   Obesity, Class III, BMI 40-49.9 (morbid obesity) (HCC)   Cough   Type 2 diabetes mellitus with vascular disease (HCC)   Syncope   HHT (hereditary hemorrhagic telangiectasia) (HCC)   Thrombocytopenia (HCC)  Peggy House is a 63 y.o. woman with a PMH of hereditary hemorrhagic telangiectasia, chronic iron  deficiency anemia, type 2 diabetes, tobacco use disorder, GAD who presented following a syncopal episode and admitted for symptomatic anemia and R ankle fracture.   Medical and surgical workup is complete. She is awaiting discharge to CIR.  Plan:  Symptomatic anemia Hereditary hemorrhagic telangiectasia No recent nosebleeds, Hb stable at 7.0.  She received 1 unit  of packed red blood cell transfusion.  - Follow-up post transfusion H&H - Keep Hb >8. - For recurrent nosebleeds, attempt control with cold packs and pressure. Silver  nitrate can be tried if source of bleeding evident. If persistent, consider ENT for hemostasis.  Trimalleolar R Ankle Fracture Stable, endorses adequate pain control.  She is status post closed reduction and ORIF with orthopedics.  Right lower extremity wrapped in a cast.  Patient is medically ready for discharge, will need updated OT note to proceed with insurance authorization for CIR.  Plan:  - Order for OT treat and eval placed.  - Scheduled Tylenol  1 g 3 times daily, oxycodone  10mg  q4h prn.  For bowel regimen, Senna-docusate  BID, Miralax  BID.  - Elevate leg, ice packs  AKI, resolved. SCr 1.50 >>1.08, after 1 L of fluids.  Encouraged good oral hydration.  -Daily BMP.  HTN BP 115/51.  She is on amlodipine  10 mg.  Still holding lisinopril  20 mg due to soft blood pressures.  -Monitor. -Hold lisinopril  20 mg.  Type 2 diabetes Blood glucose around 107-135.  Metformin  was held on admission, due to normal A1c.  -Daily CBG. -continue holding metformin .  Tobacco use disorder Half pack a day smoker. -Can give nicotine  patch if requested. Was held at admission due to surgery.  -No PFTs on file to formally diagnose COPD, resume home albuterol    Generalized anxiety Continue on trazodone  50prn, Celexa  20qd.  439 Remeron  7.5qd held at admission.    Best Practice: Diet: Cardiac diet IVF: None VTE: DOAC Code: DNR/DNI AB: None DISPO: Anticipated discharge to CIR, pending insurance authorization. Signature:  Trevor Duty, M.D.  Internal Medicine Resident, PGY-1 [name]  Pager: [phone] 3:05 PM, 04/29/2023   **Please contact the on call pager after 5 pm and on weekends at [phone].**

## 2023-04-29 NOTE — Consult Note (Signed)
 Physical Medicine and Rehabilitation Consult Reason for Consult:impaired functional mobility after fall Referring Physician: Lovie   HPI: Peggy House is a 63 y.o. female with a history of hereditary hemorrhagic telangiectasia, chronic iron  deficiency anemia, diabetes, morbid obesity who suffered a syncopal episode and fall on 04/13/2023 resulting in a trimalleolar right ankle fracture.  She has required blood transfusions for symptomatic anemia here in the hospital as well as on an outpatient basis prior to this admit.  Source has been GI but also primarily epistaxis.  She had been requiring weekly transfusions as an outpatient.  Blood pressure has been borderline to high requiring adjustment of regimen.  Patient was up with physical therapy yesterday and was mod assist for sit to stand transfers.  She has tolerated standing for about 40 seconds but has not been able to ambulate as of yet.  Patient lives with her son in a 1 level apartment with level entry.  She uses a rolling walker for ambulation and needed some assistance with ADLs prior to admit.  Son works from 3 PM to 3 AM daily  Review of Systems  Constitutional:  Positive for malaise/fatigue. Negative for fever.  HENT:  Negative for hearing loss.   Eyes:  Negative for blurred vision and double vision.  Respiratory:  Positive for shortness of breath. Negative for cough.   Cardiovascular:  Positive for leg swelling. Negative for chest pain.  Gastrointestinal:  Negative for nausea and vomiting.  Genitourinary:  Negative for dysuria.  Musculoskeletal:  Positive for falls and myalgias.  Skin: Negative.   Neurological:  Positive for dizziness and weakness.  Endo/Heme/Allergies:  Bruises/bleeds easily.  Psychiatric/Behavioral:  Negative for depression.    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   Past Surgical History:  Procedure Laterality Date   ABDOMINAL HYSTERECTOMY     CARPAL TUNNEL RELEASE  05/13/2011   Procedure: CARPAL TUNNEL RELEASE;  Surgeon: Eva Elsie Herring, MD;  Location: [facility];  Service: Orthopedics;  Laterality: Left;   COLONOSCOPY N/A 03/02/2020   Procedure: COLONOSCOPY;  Surgeon: Donnald Charleston, MD;  Location: [facility] ENDOSCOPY;  Service: Endoscopy;  Laterality: N/A;   COLONOSCOPY WITH PROPOFOL  N/A 04/28/2014   Procedure: COLONOSCOPY WITH PROPOFOL ;  Surgeon: Charleston Donnald GAILS, MD;  Location: [facility] ENDOSCOPY;  Service: Endoscopy;  Laterality: N/A;   DG TOES*L*  2/10   rt   DILATION AND CURETTAGE OF UTERUS     ENTEROSCOPY N/A 10/17/2017   Procedure: ENTEROSCOPY;  Surgeon: Elicia Claw, MD;  Location: [facility] ENDOSCOPY;  Service: Gastroenterology;  Laterality: N/A;   ESOPHAGOGASTRODUODENOSCOPY N/A 04/10/2014   Procedure: ESOPHAGOGASTRODUODENOSCOPY (EGD);  Surgeon: Jerrell KYM Sol, MD;  Location: [facility] ENDOSCOPY;  Service: Endoscopy;  Laterality: N/A;   ESOPHAGOGASTRODUODENOSCOPY N/A 05/10/2017   Procedure: ESOPHAGOGASTRODUODENOSCOPY (EGD);  Surgeon: Donnald Charleston, MD;  Location: [facility] ENDOSCOPY;  Service: Endoscopy;  Laterality: N/A;   ESOPHAGOGASTRODUODENOSCOPY N/A 09/22/2017   Procedure: ESOPHAGOGASTRODUODENOSCOPY (EGD);  Surgeon: Rosalie Kitchens, MD;  Location: [facility] ENDOSCOPY;  Service: Endoscopy;  Laterality: N/A;  bedside   ESOPHAGOGASTRODUODENOSCOPY N/A 03/02/2020   Procedure: ESOPHAGOGASTRODUODENOSCOPY (EGD);  Surgeon: Donnald Charleston, MD;  Location: [facility];  Service: Endoscopy;  Laterality: N/A;   ESOPHAGOGASTRODUODENOSCOPY N/A 11/03/2020   Procedure: ESOPHAGOGASTRODUODENOSCOPY (EGD);  Surgeon: Dianna Specking, MD;  Location: [facility]  ENDOSCOPY;  Service: Endoscopy;  Laterality: N/A;   ESOPHAGOGASTRODUODENOSCOPY N/A 04/12/2022   Procedure: ESOPHAGOGASTRODUODENOSCOPY (EGD);  Surgeon: Rosalie Kitchens, MD;  Location: [facility];  Service: Gastroenterology;  Laterality: N/A;   ESOPHAGOGASTRODUODENOSCOPY N/A 05/27/2022   Procedure: ESOPHAGOGASTRODUODENOSCOPY (EGD);  Surgeon: Burnette Fallow, MD;  Location: [facility];  Service: Gastroenterology;  Laterality: N/A;   ESOPHAGOGASTRODUODENOSCOPY N/A 09/27/2022   Procedure: ESOPHAGOGASTRODUODENOSCOPY (EGD);  Surgeon: Kriss Estefana DEL, DO;  Location: [facility] ENDOSCOPY;  Service: Gastroenterology;  Laterality: N/A;   ESOPHAGOGASTRODUODENOSCOPY (EGD) WITH PROPOFOL  N/A 04/27/2014   Procedure: ESOPHAGOGASTRODUODENOSCOPY (EGD) WITH PROPOFOL ;  Surgeon: Charleston Donnald GAILS, MD;  Location: [facility] ENDOSCOPY;  Service: Endoscopy;  Laterality: N/A;  possible apc   ESOPHAGOGASTRODUODENOSCOPY (EGD) WITH PROPOFOL  N/A 09/30/2017   Procedure: ESOPHAGOGASTRODUODENOSCOPY (EGD) WITH PROPOFOL ;  Surgeon: Saintclair Jasper, MD;  Location: [facility] ENDOSCOPY;  Service: Gastroenterology;  Laterality: N/A;   ESOPHAGOGASTRODUODENOSCOPY (EGD) WITH PROPOFOL  N/A 10/01/2017   Procedure: ESOPHAGOGASTRODUODENOSCOPY (EGD) WITH PROPOFOL ;  Surgeon: Saintclair Jasper, MD;  Location: [facility] ENDOSCOPY;  Service: Gastroenterology;  Laterality: N/A;   ESOPHAGOGASTRODUODENOSCOPY (EGD) WITH PROPOFOL  N/A 10/08/2017   Procedure: ESOPHAGOGASTRODUODENOSCOPY (EGD) WITH PROPOFOL ;  Surgeon: Elicia Claw, MD;  Location: [facility] ENDOSCOPY;  Service: Gastroenterology;  Laterality: N/A;   ESOPHAGOGASTRODUODENOSCOPY (EGD) WITH PROPOFOL  N/A 10/17/2017   Procedure: ESOPHAGOGASTRODUODENOSCOPY (EGD) WITH PROPOFOL ;  Surgeon: Elicia Claw, MD;  Location: [facility] ENDOSCOPY;  Service: Gastroenterology;  Laterality: N/A;   ESOPHAGOGASTRODUODENOSCOPY (EGD) WITH PROPOFOL  N/A 10/19/2017   Procedure: ESOPHAGOGASTRODUODENOSCOPY (EGD) WITH PROPOFOL ;  Surgeon: Elicia Claw, MD;  Location: [facility]  ENDOSCOPY;  Service: Gastroenterology;  Laterality: N/A;   ESOPHAGOGASTRODUODENOSCOPY (EGD) WITH PROPOFOL  N/A 12/04/2018   Procedure: ESOPHAGOGASTRODUODENOSCOPY (EGD) WITH PROPOFOL ;  Surgeon: Dianna Specking, MD;  Location: [facility] ENDOSCOPY;  Service: Endoscopy;  Laterality: N/A;   GIVENS CAPSULE STUDY N/A 10/02/2017   Procedure: GIVENS CAPSULE STUDY;  Surgeon: Saintclair Jasper, MD;  Location: [facility] ENDOSCOPY;  Service: Gastroenterology;  Laterality: N/A;   GIVENS CAPSULE STUDY N/A 10/08/2017   Procedure: GIVENS CAPSULE STUDY;  Surgeon: Elicia Claw, MD;  Location: [facility] ENDOSCOPY;  Service: Gastroenterology;  Laterality: N/A;  endoscopic placement of capsule   GIVENS CAPSULE STUDY N/A 03/02/2020   Procedure: GIVENS CAPSULE STUDY;  Surgeon: Donnald Charleston, MD;  Location: [facility] ENDOSCOPY;  Service: Endoscopy;  Laterality: N/A;   HEMOSTASIS CLIP PLACEMENT  12/04/2018   Procedure: HEMOSTASIS CLIP PLACEMENT;  Surgeon: Dianna Specking, MD;  Location: [facility] ENDOSCOPY;  Service: Endoscopy;;   HEMOSTASIS CLIP PLACEMENT  05/27/2022   Procedure: HEMOSTASIS CLIP PLACEMENT;  Surgeon: Burnette Fallow, MD;  Location: [facility] ENDOSCOPY;  Service: Gastroenterology;;   HEMOSTASIS CLIP PLACEMENT  09/27/2022   Procedure: HEMOSTASIS CLIP PLACEMENT;  Surgeon: Kriss Estefana DEL, DO;  Location: [facility] ENDOSCOPY;  Service: Gastroenterology;;   HEMOSTASIS CONTROL  05/27/2022   Procedure: HEMOSTASIS CONTROL;  Surgeon: Burnette Fallow, MD;  Location: [facility] ENDOSCOPY;  Service: Gastroenterology;;   HOT HEMOSTASIS N/A 04/27/2014   Procedure: HOT HEMOSTASIS (ARGON PLASMA COAGULATION/BICAP);  Surgeon: Charleston Donnald GAILS, MD;  Location: [facility] ENDOSCOPY;  Service: Endoscopy;  Laterality: N/A;   HOT HEMOSTASIS N/A 09/30/2017   Procedure: HOT HEMOSTASIS (ARGON PLASMA COAGULATION/BICAP);  Surgeon: Saintclair Jasper, MD;  Location: [facility] ENDOSCOPY;  Service: Gastroenterology;  Laterality: N/A;   HOT HEMOSTASIS N/A 10/01/2017   Procedure: HOT HEMOSTASIS (ARGON PLASMA  COAGULATION/BICAP);  Surgeon: Saintclair Jasper, MD;  Location:  [facility] ENDOSCOPY;  Service: Gastroenterology;  Laterality: N/A;   HOT HEMOSTASIS N/A 10/17/2017   Procedure: HOT HEMOSTASIS (ARGON PLASMA COAGULATION/BICAP);  Surgeon: Elicia Claw, MD;  Location: [facility] ENDOSCOPY;  Service: Gastroenterology;  Laterality: N/A;   HOT HEMOSTASIS N/A 10/19/2017   Procedure: HOT HEMOSTASIS (ARGON PLASMA COAGULATION/BICAP);  Surgeon: Elicia Claw, MD;  Location: [facility] ENDOSCOPY;  Service: Gastroenterology;  Laterality: N/A;   HOT HEMOSTASIS N/A 03/02/2020   Procedure: HOT HEMOSTASIS (ARGON PLASMA COAGULATION/BICAP);  Surgeon: Donnald Charleston, MD;  Location: [facility];  Service: Endoscopy;  Laterality: N/A;   HOT HEMOSTASIS N/A 04/12/2022   Procedure: HOT HEMOSTASIS (ARGON PLASMA COAGULATION/BICAP);  Surgeon: Rosalie Kitchens, MD;  Location: [facility];  Service: Gastroenterology;  Laterality: N/A;   IR IMAGING GUIDED PORT INSERTION  07/08/2018   L shoulder Surgery  2011   ORIF ANKLE FRACTURE Right 04/24/2023   Procedure: OPEN REDUCTION INTERNAL FIXATION (ORIF) ANKLE FRACTURE;  Surgeon: Sherida Adine BROCKS, MD;  Location: [facility] OR;  Service: Orthopedics;  Laterality: Right;   POLYPECTOMY  03/02/2020   Procedure: POLYPECTOMY;  Surgeon: Donnald Charleston, MD;  Location: [facility] ENDOSCOPY;  Service: Endoscopy;;   SCLEROTHERAPY  11/03/2020   Procedure: MATIAS;  Surgeon: Dianna Specking, MD;  Location: [facility] ENDOSCOPY;  Service: Endoscopy;;   SUBMUCOSAL INJECTION  09/22/2017   Procedure: SUBMUCOSAL INJECTION;  Surgeon: Rosalie Kitchens, MD;  Location: [facility] ENDOSCOPY;  Service: Endoscopy;;   SUBMUCOSAL INJECTION  12/04/2018   Procedure: SUBMUCOSAL INJECTION;  Surgeon: Dianna Specking, MD;  Location: [facility] ENDOSCOPY;  Service: Endoscopy;;   Family History  Problem Relation Age of Onset   Cancer Mother        Ovarian   Ovarian cancer Mother    Diabetes Mother    Kidney disease Mother    Bleeding Disorder Mother    Diabetes type II  Sister    Bleeding Disorder Sister    Diabetes Sister    Colon cancer Maternal Grandfather    Crohn's disease Maternal Grandfather    Stomach cancer Maternal Grandmother    Kidney disease Son    Bleeding Disorder Son    Dysmenorrhea Neg Hx    Social History:  reports that she has been smoking cigarettes. She has a 15 pack-year smoking history. She quit smokeless tobacco use about 44 years ago.  Her smokeless tobacco use included snuff. She reports that she does not drink alcohol and does not use drugs. Allergies:  Allergies  Allergen Reactions   Feraheme  [Ferumoxytol ] Other (See Comments)    Muscle tetany, encephalopathy, fatigue, nausea (reaction to injection)   Nsaids Other (See Comments)    Stomach bleeding episodes; Tylenol  is OK   Tomato Hives   Iron  (Ferrous Sulfate ) [Ferrous Sulfate  Er] Other (See Comments)    Shock   Wasp Venom Swelling   Medications Prior to Admission  Medication Sig Dispense Refill   Accu-Chek Softclix Lancets lancets Use to check blood sugar before breakfast and before dinner while on steroids (Patient taking differently: 1 each by Other route See admin instructions. Use to check blood sugar before breakfast and before dinner while on steroids) 100 each 1   ALPRAZolam  (XANAX ) 0.5 MG tablet TAKE 1 TABLET BY MOUTH AT BEDTIME AS NEEDED FOR ANXIETY OR SLEEP 7 tablet 0   aminocaproic  acid (AMICAR ) 500 MG tablet Take 2 tablets (1,000 mg total) by mouth every 12 (twelve) hours. 120 tablet 3   amLODipine  (NORVASC ) 10 MG tablet TAKE 1 TABLET(10 MG) BY MOUTH DAILY (Patient taking differently: Take 10 mg by mouth  daily.) 90 tablet 3   citalopram  (CELEXA ) 20 MG tablet Take 1 tablet (20 mg total) by mouth daily. 90 tablet 3   glucose blood (ACCU-CHEK GUIDE) test strip Check blood sugar 2 times per day while on steroids before breakfast and dinner (Patient taking differently: 1 each by Other route See admin instructions. Check blood sugar 2 times per day while on steroids  before breakfast and dinner) 50 each 3   lisinopril  (ZESTRIL ) 20 MG tablet Take 20 mg by mouth daily.     metFORMIN  (GLUCOPHAGE ) 500 MG tablet TAKE 1 TABLET(500 MG) BY MOUTH TWICE DAILY WITH A MEAL 180 tablet 3   mirtazapine  (REMERON ) 7.5 MG tablet Take 1 tablet (7.5 mg total) by mouth at bedtime. 30 tablet 1   nicotine  (NICODERM CQ  - DOSED IN MG/24 HOURS) 14 mg/24hr patch Place 1 patch (14 mg total) onto the skin daily. (Patient taking differently: Place 14 mg onto the skin daily.) 30 patch 3   ondansetron  (ZOFRAN ) 4 MG tablet Take 1 tablet (4 mg total) by mouth every 8 (eight) hours as needed for nausea or vomiting. 30 tablet 0   pantoprazole  (PROTONIX ) 40 MG tablet TAKE 1 TABLET(40 MG) BY MOUTH TWICE DAILY (Patient taking differently: Take 40 mg by mouth daily.) 180 tablet 1   PROAIR  HFA 108 (90 Base) MCG/ACT inhaler INHALE 1 TO 2 PUFFS INTO THE LUNGS EVERY 6 HOURS AS NEEDED FOR WHEEZING OR SHORTNESS OF BREATH (Patient taking differently: Inhale 1-2 puffs into the lungs every 6 (six) hours as needed for wheezing or shortness of breath.) 8.5 g 0   traZODone  (DESYREL ) 100 MG tablet TAKE 1 TABLET BY MOUTH AT BEDTIME 30 tablet 2   TYLENOL  PM EXTRA STRENGTH 500-25 MG TABS tablet Take 2 tablets by mouth at bedtime as needed (for sleep, knee pain).      Home: Home Living Family/patient expects to be discharged to:: Private residence Living Arrangements: Children (Son) Available Help at Discharge: Family, Available PRN/intermittently (son works but sister can stay as long as needed) Type of Home: Apartment Home Access: Level entry Home Layout: One level Bathroom Shower/Tub: Sponge bathes at baseline, Tub/shower unit Bathroom Toilet: Standard (BSC on top) Bathroom Accessibility: Yes Home Equipment: Agricultural Consultant (2 wheels), BSC/3in1 Additional Comments: Pt lives with son who works 3pm-3am  Functional History: Prior Function Prior Level of Function : Needs assist Mobility Comments: pt reported  using RW off and on for a few months ADLs Comments: needed assist with dressing Functional Status:  Mobility: Bed Mobility Overal bed mobility: Needs Assistance Bed Mobility: Supine to Sit Rolling: Supervision Sidelying to sit: Used rails, Min assist, HOB elevated Supine to sit: Used rails, Min assist, HOB elevated Sit to supine: Min assist, HOB elevated, Used rails General bed mobility comments: Slow and effortful to rise. HOB slightly elevated, performed 75% for transition by herself with therapist allowing pt to pull through hand to scoot to EOB and squre herself at the end. Transfers Overall transfer level: Needs assistance Equipment used: Rolling walker (2 wheels), None Transfers: Sit to/from Stand, Bed to chair/wheelchair/BSC Sit to Stand: Mod assist, +2 physical assistance Bed to/from chair/wheelchair/BSC transfer type:: Lateral/scoot transfer  Lateral/Scoot Transfers: Min assist General transfer comment: Performed lateral scoot from bed to recliner with min assist to support RLE. Educated on techniques and progressed nicely with ability to use UEs to boost hips off bed and perform lateral scoots with some clearance. Requires extra time and is effortful but with some cues for technique and  support with RLE pt performed successfully. Progressed with sit to stand using RW, cues for hand placement and technique, required Mod assist +2 from reclining chair. Tolerated approx 40 seconds  and took 2 small hops backwards, therapist supported RLE to prevent WB. Ambulation/Gait General Gait Details: unable    ADL: ADL Overall ADL's : Needs assistance/impaired Eating/Feeding: Set up, Sitting Grooming: Wash/dry hands, Wash/dry face, Sitting, Set up Upper Body Bathing: Set up, Sitting Lower Body Bathing: Moderate assistance, Maximal assistance, Sitting/lateral leans Upper Body Dressing : Set up, Sitting Lower Body Dressing: Maximal assistance, Total assistance, Sitting/lateral  leans Toilet Transfer: Minimal assistance, BSC/3in1 (incremental scoot transfer) Toileting- Clothing Manipulation and Hygiene: Moderate assistance, Sitting/lateral lean Toileting - Clothing Manipulation Details (indicate cue type and reason): for thoroughness Functional mobility during ADLs: Moderate assistance, Rolling walker (2 wheels) General ADL Comments: Patient seen as a re-eval this date due to surgery on R ankle (remains NWB). Patient able to complete incremental scoot to drop arm recliner at min A, and then completed sit<>stand with mod A of 2 in order to maintain NWB and to assist in transfer with RW. Patient is mod A for ADL management. OT continues to recommend rehab stint < 3 hours due to current level, and need for assist for all aspects of care. OT will continue to follow.  Cognition: Cognition Overall Cognitive Status: Within Functional Limits for tasks assessed Orientation Level: Oriented X4 Cognition Arousal: Alert Behavior During Therapy: WFL for tasks assessed/performed Overall Cognitive Status: Within Functional Limits for tasks assessed  Blood pressure (!) 115/51, pulse (!) 53, temperature 98.4 F (36.9 C), resp. rate 18, height 5' 4.5 (1.638 m), weight 102.5 kg, SpO2 100%. Physical Exam Constitutional:      General: She is not in acute distress.    Appearance: She is obese.  HENT:     Head: Normocephalic and atraumatic.     Right Ear: External ear normal.     Left Ear: External ear normal.     Nose: Nose normal.     Mouth/Throat:     Mouth: Mucous membranes are moist.  Eyes:     Pupils: Pupils are equal, round, and reactive to light.  Cardiovascular:     Rate and Rhythm: Bradycardia present.  Pulmonary:     Effort: Pulmonary effort is normal.  Abdominal:     General: There is no distension.     Palpations: Abdomen is soft.  Musculoskeletal:        General: Swelling present. Normal range of motion.     Cervical back: Normal range of motion.      Comments: Right leg in splint  Skin:    General: Skin is warm.     Coloration: Skin is not jaundiced.     Comments: Skin scaly, dry in LE's  Neurological:     Mental Status: She is alert.     Comments: Alert and oriented x 3. Normal insight and awareness. Intact Memory. Normal language and speech. Cranial nerve exam unremarkable. MMT: UE 4/5 prox to 4+/5 distal. RLE- 2/5 (pain), able to wiggle toes. LLE 3/5 HF, KE and 4+/5 ADF/PF.  Sensory exam normal for light touch and pain in all 4 limbs. No limb ataxia or cerebellar signs. No abnormal tone appreciated.    Psychiatric:        Mood and Affect: Mood normal.        Behavior: Behavior normal.     Results for orders placed or performed during the hospital encounter of 04/13/23 (  from the past 24 hours)  Glucose, capillary     Status: Abnormal   Collection Time: 04/28/23 12:12 PM  Result Value Ref Range   Glucose-Capillary 137 (H) 70 - 99 mg/dL  CBC     Status: Abnormal   Collection Time: 04/29/23  2:27 AM  Result Value Ref Range   WBC 10.5 4.0 - 10.5 K/uL   RBC 2.61 (L) 3.87 - 5.11 MIL/uL   Hemoglobin 7.0 (L) 12.0 - 15.0 g/dL   HCT 76.9 (L) 63.9 - 53.9 %   MCV 88.1 80.0 - 100.0 fL   MCH 26.8 26.0 - 34.0 pg   MCHC 30.4 30.0 - 36.0 g/dL   RDW 82.3 (H) 88.4 - 84.4 %   Platelets 469 (H) 150 - 400 K/uL   nRBC 0.0 0.0 - 0.2 %  Basic metabolic panel     Status: Abnormal   Collection Time: 04/29/23  2:27 AM  Result Value Ref Range   Sodium 141 135 - 145 mmol/L   Potassium 4.2 3.5 - 5.1 mmol/L   Chloride 109 98 - 111 mmol/L   CO2 26 22 - 32 mmol/L   Glucose, Bld 100 (H) 70 - 99 mg/dL   BUN 28 (H) 8 - 23 mg/dL   Creatinine, Ser 8.91 (H) 0.44 - 1.00 mg/dL   Calcium  8.4 (L) 8.9 - 10.3 mg/dL   GFR, Estimated 58 (L) >60 mL/min   Anion gap 6 5 - 15  Glucose, capillary     Status: None   Collection Time: 04/29/23  8:01 AM  Result Value Ref Range   Glucose-Capillary 98 70 - 99 mg/dL   *Note: Due to a large number of results and/or  encounters for the requested time period, some results have not been displayed. A complete set of results can be found in Results Review.   No results found.  Assessment/Plan: Diagnosis: 63 year old female with syncopal episode causing a fall and right trimalleolar ankle fracture.  She is nonweightbearing right lower extremity Does the need for close, 24 hr/day medical supervision in concert with the patient's rehab needs make it unreasonable for this patient to be served in a less intensive setting? 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 Yes Does the patient require coordinated care of a physician, rehab nurse, therapy disciplines of PT, OT to address physical and functional deficits in the context of the above medical diagnosis(es)? Yes Addressing deficits in the following areas: balance, endurance, locomotion, strength, transferring, bowel/bladder control, bathing, dressing, feeding, grooming, toileting, and psychosocial support Can the patient actively participate in an intensive therapy program of at least 3 hrs of therapy per day at least 5 days per week? Yes The potential for patient to make measurable gains while on inpatient rehab is excellent Anticipated functional outcomes upon discharge from inpatient rehab are modified independent and supervision  with PT, modified independent and supervision with OT, n/a with SLP. Estimated rehab length of stay to reach the above functional goals is: 13-17 days Anticipated discharge destination: Home Overall Rehab/Functional Prognosis: excellent  POST ACUTE RECOMMENDATIONS: This patient's condition is appropriate  for continued rehabilitative care in the following setting: CIR Patient has agreed to participate in recommended program. Yes Note that insurance prior authorization may be required for reimbursement for recommended care.  Comment: Pt  has sister and friends who can provide some help aside from her son who works from 3p-3a daily. She is motivated to regain household independence. Rehab Admissions Coordinator to follow up.   I have personally performed a face to face diagnostic evaluation of this patient. Additionally, I have examined the patient's medical record including any pertinent labs and radiographic images. If the physician assistant has documented in this note, I have reviewed and edited or otherwise concur with the physician assistant's documentation.  Thanks,  Arthea ONEIDA Gunther, MD 04/29/2023

## 2023-04-29 NOTE — Progress Notes (Signed)
 Mobility Specialist: Progress Note   04/29/23 0946  Mobility  Activity Transferred from bed to chair  Level of Assistance +2 (takes two people)  Press Photographer wheel walker  RLE Weight Bearing Per Provider Order NWB  Activity Response Tolerated well  Mobility Referral Yes  Mobility visit 1 Mobility  Mobility Specialist Start Time (ACUTE ONLY) [id number]  Mobility Specialist Stop Time (ACUTE ONLY) [id number]  Mobility Specialist Time Calculation (min) (ACUTE ONLY) 13 min    Pt was agreeable to mobility session - received in bed. C/o R ankle pain upon standing and L knee pain from arthritis. MinA for bed mobility, modA+2 for transfer. Left in chair with all needs met, call bell in reach.   Ileana Lute Mobility Specialist Please contact via SecureChat or Rehab office at [phone]

## 2023-04-29 NOTE — Progress Notes (Addendum)
 OT Cancellation Note  Patient Details Name: Peggy House MRN: [id number] DOB: [birth year]   Cancelled Treatment:    Reason Eval/Treat Not Completed: Patient at procedure or test/ unavailable;Other (comment) (working with mobility team) pt working with mobility tech at this time, will check back as time allows for OT intervention.   Returned at 1330 with pt in chair declining session at this time d/t pain. Will continue efforts as time allows.   Ronal Mallie POUR., COTA/L Acute Rehabilitation Services [phone]   Ronal Mallie Needy 04/29/2023, 8:48 AM

## 2023-04-29 NOTE — Plan of Care (Signed)

## 2023-04-30 DIAGNOSIS — I10 Essential (primary) hypertension: Secondary | ICD-10-CM | POA: Diagnosis not present

## 2023-04-30 DIAGNOSIS — Z7984 Long term (current) use of oral hypoglycemic drugs: Secondary | ICD-10-CM | POA: Diagnosis not present

## 2023-04-30 DIAGNOSIS — S82851D Displaced trimalleolar fracture of right lower leg, subsequent encounter for closed fracture with routine healing: Secondary | ICD-10-CM | POA: Diagnosis not present

## 2023-04-30 DIAGNOSIS — D649 Anemia, unspecified: Secondary | ICD-10-CM | POA: Diagnosis not present

## 2023-04-30 DIAGNOSIS — E119 Type 2 diabetes mellitus without complications: Secondary | ICD-10-CM | POA: Diagnosis not present

## 2023-04-30 DIAGNOSIS — I780 Hereditary hemorrhagic telangiectasia: Secondary | ICD-10-CM | POA: Diagnosis not present

## 2023-04-30 LAB — TYPE AND SCREEN
ABO/RH(D): A NEG
Antibody Screen: NEGATIVE
Unit division: 0
Unit division: 0

## 2023-04-30 LAB — BASIC METABOLIC PANEL
Anion gap: 6 (ref 5–15)
BUN: 22 mg/dL (ref 8–23)
CO2: 26 mmol/L (ref 22–32)
Calcium: 8.5 mg/dL — ABNORMAL LOW (ref 8.9–10.3)
Chloride: 110 mmol/L (ref 98–111)
Creatinine, Ser: 0.96 mg/dL (ref 0.44–1.00)
GFR, Estimated: >60 mL/min (ref >60)
Glucose, Bld: 123 mg/dL — ABNORMAL HIGH (ref 70–99)
Potassium: 3.9 mmol/L (ref 3.5–5.1)
Sodium: 142 mmol/L (ref 135–145)

## 2023-04-30 LAB — BPAM RBC
Blood Product Expiration Date: 202501272359
Blood Product Expiration Date: 202501272359
ISSUE DATE / TIME: 202501120942
ISSUE DATE / TIME: 202501141618
Unit Type and Rh: 600
Unit Type and Rh: 600

## 2023-04-30 LAB — CBC
HCT: 25.7 % — ABNORMAL LOW (ref 36.0–46.0)
Hemoglobin: 7.6 g/dL — ABNORMAL LOW (ref 12.0–15.0)
MCH: 26.4 pg (ref 26.0–34.0)
MCHC: 29.6 g/dL — ABNORMAL LOW (ref 30.0–36.0)
MCV: 89.2 fL (ref 80.0–100.0)
Platelets: 432 10*3/uL — ABNORMAL HIGH (ref 150–400)
RBC: 2.88 MIL/uL — ABNORMAL LOW (ref 3.87–5.11)
RDW: 16.9 % — ABNORMAL HIGH (ref 11.5–15.5)
WBC: 9.1 10*3/uL (ref 4.0–10.5)
nRBC: 0 % (ref 0.0–0.2)

## 2023-04-30 LAB — GLUCOSE, CAPILLARY
Glucose-Capillary: 138 mg/dL — ABNORMAL HIGH (ref 70–99)
Glucose-Capillary: 131 mg/dL — ABNORMAL HIGH (ref 70–99)
Glucose-Capillary: 126 mg/dL — ABNORMAL HIGH (ref 70–99)
Glucose-Capillary: 168 mg/dL — ABNORMAL HIGH (ref 70–99)

## 2023-04-30 NOTE — Progress Notes (Signed)
 Occupational Therapy Treatment Patient Details Name: Peggy House MRN: [id number] DOB: 01-19-1961 Today's Date: 04/30/2023   History of present illness 63 y.o. female Presented with syncopal episode and admitted for symptomatic anemia and R ankle fracture, external reduction performed 12/29. Pt underwent rt ankle ORIF on 04/24/23.  PMH includes: hereditary Hemorrhagic Telangiectasia, blood loss anemia, MDD, GAD, arthritis, HTN, obesity, DM2.   OT comments  Patient focusing on OOB activity and functional independence with ADLs, patient is close to being able to complete an incremental scoot transfer at mod I level, though continues to require light min A for RLE management and cues for hand placement and safety. Patient is a mod A - total A for lower body ADL management. Given patient's ability to progress, her previous level of independence, and her ability to have 24/7 support, OT recommending intensive therapy > 3 hours in order to return to prior level of function.       If plan is discharge home, recommend the following:  Two people to help with walking and/or transfers;A lot of help with bathing/dressing/bathroom;Assistance with cooking/housework;Assist for transportation;Help with stairs or ramp for entrance   Equipment Recommendations  Wheelchair (measurements OT);Wheelchair cushion (measurements OT)    Recommendations for Other Services      Precautions / Restrictions Precautions Precautions: Fall Restrictions Weight Bearing Restrictions Per Provider Order: Yes RLE Weight Bearing Per Provider Order: Non weight bearing       Mobility Bed Mobility Overal bed mobility: Needs Assistance Bed Mobility: Supine to Sit   Sidelying to sit: Used rails, HOB elevated, Contact guard assist       General bed mobility comments: Able to complete bed mobility with minimal extra time and minimal use of bed rails    Transfers Overall transfer level: Needs assistance    Transfers: Bed to chair/wheelchair/BSC            Lateral/Scoot Transfers: Min assist General transfer comment: Performed lateral scoot from bed to recliner with min assist to support RLE. Educated on techniques and progressed nicely with ability to use UEs to boost hips off bed and perform lateral scoots with fair clearance. Requires extra time and is effortful but with some cues for technique and support with RLE pt performed successfully.     Balance Overall balance assessment: Needs assistance Sitting-balance support: No upper extremity supported, Feet supported Sitting balance-Leahy Scale: Good Sitting balance - Comments: EOB no assist. maintains NWB on Rt   Standing balance support: Bilateral upper extremity supported, Reliant on assistive device for balance                               ADL either performed or assessed with clinical judgement   ADL Overall ADL's : Needs assistance/impaired Eating/Feeding: Set up;Sitting   Grooming: Wash/dry hands;Wash/dry face;Oral care;Set up;Sitting               Lower Body Dressing: Maximal assistance;Total assistance;Sitting/lateral leans   Toilet Transfer: Minimal assistance;BSC/3in1 (incremental scoot transfer) Toilet Transfer Details (indicate cue type and reason): simulated with scoot transfer         Functional mobility during ADLs: Moderate assistance;Rolling walker (2 wheels) General ADL Comments: Patient focusing on OOB activity and functional independence with ADLs, patient is close to being able to complete an incremental scoot transfer at mod I level, though continues to require light min A for RLE management and cues for hand placement and safety. Patient is a  mod A - total A for lower body ADL management. Given patient's ability to progress, her previous level of independence, and her ability to have 24/7 support, OT recommending intensive therapy > 3 hours in order to return to prior level of function.     Extremity/Trunk Assessment              Vision       Perception     Praxis      Cognition Arousal: Alert Behavior During Therapy: WFL for tasks assessed/performed Overall Cognitive Status: Within Functional Limits for tasks assessed                                          Exercises      Shoulder Instructions       General Comments      Pertinent Vitals/ Pain       Pain Assessment Pain Assessment: Faces Faces Pain Scale: Hurts a little bit Pain Location: R ankle with movement Pain Descriptors / Indicators: Sore, Discomfort, Grimacing, Guarding Pain Intervention(s): Limited activity within patient's tolerance, Monitored during session, Premedicated before session, Repositioned  Home Living                                          Prior Functioning/Environment              Frequency  Min 1X/week        Progress Toward Goals  OT Goals(current goals can now be found in the care plan section)  Progress towards OT goals: Progressing toward goals  Acute Rehab OT Goals Patient Stated Goal: to get to my level of independence OT Goal Formulation: With patient Time For Goal Achievement: 05/09/23 Potential to Achieve Goals: Good  Plan      Co-evaluation                 AM-PAC OT "6 Clicks" Daily Activity     Outcome Measure   Help from another person eating meals?: A Little Help from another person taking care of personal grooming?: A Little Help from another person toileting, which includes using toliet, bedpan, or urinal?: A Lot Help from another person bathing (including washing, rinsing, drying)?: A Lot Help from another person to put on and taking off regular upper body clothing?: A Little Help from another person to put on and taking off regular lower body clothing?: A Lot 6 Click Score: 15    End of Session    OT Visit Diagnosis: Pain;Muscle weakness (generalized) (M62.81);History of falling  (Z91.81);Unsteadiness on feet (R26.81) Pain - Right/Left: Right Pain - part of body: Ankle and joints of foot   Activity Tolerance Patient tolerated treatment well   Patient Left in chair;with call bell/phone within reach;with chair alarm set   Nurse Communication Mobility status        Time: 1610-9604 OT Time Calculation (min): 23 min  Charges: OT General Charges $OT Visit: 1 Visit OT Treatments $Self Care/Home Management : 23-37 mins  Mollie Anger E. Desia Saban, OTR/L Acute Rehabilitation Services [phone]   Vincent Greek 04/30/2023, 9:09 AM

## 2023-04-30 NOTE — Plan of Care (Signed)

## 2023-04-30 NOTE — Progress Notes (Addendum)
 HD#16 SUBJECTIVE:  Patient Summary: Peggy House is a 63 y.o. with a pertinent PMH of hereditary hemorrhagic telangiectasia, chronic anemia, hypertension, hyperlipidemia, GI bleed, and type 2 diabetes , who presented with syncopal episode and admitted for symptomatic anemia and R ankle fracture.   Overnight Events: No acute event.  Interim History:  Patient was examined at the bedside. Worked with occupational therapy, tolerated without any concerns.    Firefighter for CIR was started today.  OBJECTIVE:  Vital Signs: Vitals:   04/29/23 1649 04/29/23 2110 04/30/23 0413 04/30/23 0803  BP: 129/60 (!) 135/54 118/68 126/61  Pulse: (!) 55 60 62 (!) 51  Resp: 17 18 18 18   Temp: 98.4 F (36.9 C) 98.5 F (36.9 C) 98.4 F (36.9 C) 98 F (36.7 C)  TempSrc:  Oral Oral   SpO2:  98% 97% 97%  Weight:      Height:       Supplemental O2: Room Air SpO2: 97 % O2 Flow Rate (L/min): 2 L/min  Filed Weights   04/13/23 0313 04/24/23 1423  Weight: 104.1 kg 102.5 kg     Intake/Output Summary (Last 24 hours) at 04/30/2023 1447 Last data filed at 04/29/2023 1957 Gross per 24 hour  Intake 338.33 ml  Output 300 ml  Net 38.33 ml   Net IO Since Admission: -5,066.55 mL [04/30/23 1447]  Physical Exam: Physical Exam Constitutional:      Appearance: She is not ill-appearing.  Cardiovascular:     Rate and Rhythm: Normal rate.  Pulmonary:     Effort: Pulmonary effort is normal. CTABL. Auscultated front and back. Musculoskeletal:     Comments: R ankle in soft cast. R leg/foot is neurovascularly inact. Toes are warm and able to move.   Skin:    General: Skin is warm.  Neurological:     Mental Status: She is alert.  Psychiatric:        Mood and Affect: Mood normal.        Behavior: Behavior normal.   Patient Lines/Drains/Airways Status     Active Line/Drains/Airways     Name Placement date Placement time Site Days   Implanted Port 07/08/18 Right Chest 07/08/18  --  Chest   1753   External Urinary Catheter 04/22/23  1000  --  4   Incision (Closed) 04/24/23 Ankle Right 04/24/23  1747  -- 2   Pressure Injury 04/22/23 Buttocks Right;Left Stage 2 -  Partial thickness loss of dermis presenting as a shallow open injury with a red, pink wound bed without slough.  04/22/23  1000  -- 4             ASSESSMENT/PLAN:  Assessment: Principal Problem:   Closed right trimalleolar fracture Active Problems:   Acute on chronic blood loss anemia   Anxiety   Essential hypertension   Obesity, Class III, BMI 40-49.9 (morbid obesity) (HCC)   Cough   Type 2 diabetes mellitus with vascular disease (HCC)   Syncope   HHT (hereditary hemorrhagic telangiectasia) (HCC)   Thrombocytopenia (HCC)  Peggy House is a 63 y.o. woman with a PMH of hereditary hemorrhagic telangiectasia, chronic iron  deficiency anemia, type 2 diabetes, tobacco use disorder, GAD who presented following a syncopal episode and admitted for symptomatic anemia and R ankle fracture.   Medical and surgical workup is complete. She is awaiting discharge to CIR.  Plan:  Symptomatic anemia Hereditary hemorrhagic telangiectasia Hb stable at 7.6, after 1 unit of packed red blood cell transfusion.  No  shortness of breath or chest pain. Will make a decision about Xarelto  for DVT ppx once patient is ready for discharge to CIR  - CBC - For recurrent nosebleeds, attempt control with cold packs and pressure. Silver  nitrate can be tried if source of bleeding evident. If persistent, consider ENT for hemostasis.  Trimalleolar R Ankle Fracture Stable, endorses adequate pain control.  She is status post closed reduction and ORIF with orthopedics.  Right lower extremity wrapped in a cast.  Patient is medically ready for discharge, worked with occupational therapy, CIR authorization was started today.   Plan:  - Scheduled Tylenol  1 g 3 times daily, oxycodone  10mg  q4h prn.  For bowel regimen, Senna-docusate BID,  Miralax  BID.  - Elevate leg, ice packs  HTN BP 115/51.  She is on amlodipine  10 mg.  Still holding lisinopril  20 mg due to soft blood pressures.  -Monitor. -Hold lisinopril  20 mg.  Type 2 diabetes Blood glucose around 107-135.  Metformin  was held on admission, due to normal A1c.  -Daily CBG. -continue holding metformin .  Tobacco use disorder Half pack a day smoker. -Can give nicotine  patch if requested. Was held at admission due to surgery.  -No PFTs on file to formally diagnose COPD, resume home albuterol    Generalized anxiety Continue on trazodone  50prn, Celexa  20qd.  439 Remeron  7.5qd held at admission.    Best Practice: Diet: Cardiac diet IVF: None VTE: DOAC Code: DNR/DNI AB: None DISPO: Anticipated discharge to CIR, pending insurance authorization. Signature:  Deundra Furber, M.D.  Internal Medicine Resident, PGY-1 [facility]  Pager: [phone] 2:47 PM, 04/30/2023   **Please contact the on call pager after 5 pm and on weekends at [phone].**

## 2023-04-30 NOTE — Plan of Care (Signed)
   Problem: Fluid Volume: Goal: Ability to maintain a balanced intake and output will improve Outcome: Progressing   Problem: Metabolic: Goal: Ability to maintain appropriate glucose levels will improve Outcome: Progressing   Problem: Nutritional: Goal: Maintenance of adequate nutrition will improve Outcome: Progressing   Problem: Education: Goal: Knowledge of General Education information will improve Description: Including pain rating scale, medication(s)/side effects and non-pharmacologic comfort measures Outcome: Progressing

## 2023-04-30 NOTE — Progress Notes (Signed)
 Inpatient Rehab Admissions Coordinator:   I did receive insurance approval for CIR.  I will follow for admission pending bed availability, hopeful for the next 1-2 days.   Loye Rumble, PT, DPT Admissions Coordinator [phone] 04/30/23  3:38 PM

## 2023-04-30 NOTE — Progress Notes (Signed)
 Inpatient Rehab Admissions Coordinator:   Starting insurance auth request today.  Will follow.   Loye Rumble, PT, DPT Admissions Coordinator [phone] 04/30/23  11:24 AM

## 2023-04-30 NOTE — PMR Pre-admission (Signed)
PMR Admission Coordinator Pre-Admission Assessment  Patient: Peggy House is an 63 y.o., female MRN: [id number] DOB: Jul 27, 1960 Height: 5' 4.5" (163.8 cm) Weight: 102.5 kg  Insurance Information HMO:     PPO:      PCP:      IPA:      80/20:      OTHER:  PRIMARY: [employer]      Policy#: [id number]      Subscriber:  CM Name: faxed approval      Phone#: n/a     Fax#: [phone] (confirmed) Pre-Cert#: [id number] auth for CIR via faxed approval with updates due on 1/22, they have EPIC access  Employer:  Benefits:  Phone #: [phone]     Name:  Eff. Date: 05/16/22     Deduct: $0      Out of Pocket Max: $0      Life Max:  CIR: 100%      SNF:  Outpatient:      Co-Pay:  Home Health:       Co-Pay:  DME:      Co-Pay:  Providers:  SECONDARY:       Policy#:      Phone#:   Artist:       Phone#:   The Data processing manager" for patients in Inpatient Rehabilitation Facilities with attached "Privacy Act Statement-Health Care Records" was provided and verbally reviewed with: Patient  Emergency Contact Information Contact Information     Name Relation Home Work Mobile   [place] Son [phone]  [phone]   Gerda Diss   [phone]   Juanda Chance   [phone]      Other Contacts   None on File     Current Medical History  Patient Admitting Diagnosis: fall with trimalleolar fracture s/p ORIF  History of Present Illness: Pt is a 63 y/o female with PMH of 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  Per EMS report fall was precipitated by epistaxis and LOC.  In ED, WBC 14.5, Hgb 5.1, K 3.2, Cr 1.18, albumin 2.7, SBP 98.  C/O severe RLE pain and xray revealed R ankle fracture dislocation.  Orthopedics was consulted and recommended operative fixation, which pt  underwent per Dr. Hulda Humphrey on 1/9.  Cardiac workup for syncope, echo showed grade 2 diastolic dysfunction.  Hospital course ABLA and ongoing transfusions.  Therapy evaluations completed and pt was recommended for CIR.     Patient's medical record from [facility] has been reviewed by the rehabilitation admission coordinator and physician.  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 Yes  Family History   family history includes Bleeding Disorder in her mother, sister, and son; Cancer in her mother; Colon cancer in her maternal grandfather; Crohn's disease in her maternal grandfather; Diabetes in her mother and sister; Diabetes type II in her sister; Kidney disease in her  mother and son; Ovarian cancer in her mother; Stomach cancer in her maternal grandmother.  Current Medications  Current Facility-Administered Medications:    acetaminophen (TYLENOL) tablet 1,000 mg, 1,000 mg, Oral, Q8H, Patel, Amar, DO, 1,000 mg at 05/02/23 0619   albuterol (PROVENTIL) (2.5 MG/3ML) 0.083% nebulizer solution 2.5 mg, 2.5 mg, Inhalation, Q6H PRN, Allena Katz, Amar, DO   aminocaproic acid (AMICAR) tablet 1 g, 1 g, Oral, Q12H, Juberg, Christopher, DO, 1 g at 05/02/23 1036   amLODipine (NORVASC) tablet 10 mg, 10 mg, Oral, Daily, Juberg, Christopher, DO, 10 mg at 05/02/23 1036   Chlorhexidine Gluconate Cloth 2 % PADS 6 each, 6 each, Topical, Daily, Miguel Aschoff, MD, 6 each at 05/01/23 [phone]   citalopram (CELEXA) tablet 20 mg, 20 mg, Oral, Daily, Allena Katz, Amar, DO, 20 mg at 05/02/23 1035    dextromethorphan-guaiFENesin (MUCINEX DM) 30-600 MG per 12 hr tablet 2 tablet, 2 tablet, Oral, BID PRN, Dickie La, MD   feeding supplement (BOOST / RESOURCE BREEZE) liquid 1 Container, 1 Container, Oral, TID BM, Mercie Eon, MD, 1 Container at 05/02/23 1037   lisinopril (ZESTRIL) tablet 20 mg, 20 mg, Oral, Daily, Rana Snare, DO, 20 mg at 05/02/23 1035   oxyCODONE (Oxy IR/ROXICODONE) immediate release tablet 10 mg, 10 mg, Oral, Q4H PRN, Dickie La, MD, 10 mg at 05/02/23 0322   oxymetazoline (AFRIN) 0.05 % nasal spray 1 spray, 1 spray, Each Nare, BID PRN, Laretta Bolster, MD, 1 spray at 05/01/23 1305   pantoprazole (PROTONIX) EC tablet 40 mg, 40 mg, Oral, BID, Patel, Amar, DO, 40 mg at 05/02/23 1035   polyethylene glycol (MIRALAX / GLYCOLAX) packet 17 g, 17 g, Oral, BID, Juberg, Christopher, DO, 17 g at 05/01/23 1191   senna-docusate (Senokot-S) tablet 1 tablet, 1 tablet, Oral, BID, Juberg, Christopher, DO, 1 tablet at 04/30/23 2132   sodium chloride flush (NS) 0.9 % injection 10-40 mL, 10-40 mL, Intracatheter, Q12H, Miguel Aschoff, MD, 10 mL at 05/02/23 1037   traZODone (DESYREL) tablet 50 mg, 50 mg, Oral, QHS PRN, Modena Slater, DO, 50 mg at 05/01/23 2035   trimethobenzamide (TIGAN) injection 200 mg, 200 mg, Intramuscular, Q8H PRN, Rana Snare, DO, 200 mg at 05/02/23 1055  Facility-Administered Medications Ordered in Other Encounters:    diphenhydrAMINE (BENADRYL) 25 mg capsule, , , ,    diphenhydrAMINE (BENADRYL) 25 mg capsule, , , ,    phenylephrine (NEO-SYNEPHRINE) 1 % nasal drops 2 drop, 2 drop, Left Nare, Once, Malachy Mood, MD  Patients Current Diet:  Diet Order             Diet regular Room service appropriate? Yes; Fluid consistency: Thin  Diet effective now                   Precautions / Restrictions Precautions Precautions: Fall Restrictions Weight Bearing Restrictions Per Provider Order: Yes RLE Weight Bearing Per Provider Order: Non weight bearing   Has  the patient had 2 or more falls or a fall with injury in the past year? Yes  Prior Activity Level Limited Community (1-2x/wk): indpendent with intermittent RW at baseline, sponge bathes, some assist with dressing, not driving  Prior Functional Level Self Care: Did the patient need help bathing, dressing, using the toilet or eating? Needed some help  Indoor Mobility: Did the patient need assistance with walking from room to room (with or without device)? Independent  Stairs: Did the patient need assistance with internal or external stairs (with or without device)? Needed some help  Functional Cognition: Did the patient need help planning regular tasks such as shopping or remembering to take medications? Independent  Patient Information Are you of Hispanic, Latino/a,or Spanish origin?: E. Yes, another Hispanic, Latino, or Spanish origin What is your race?: A. White, B. Black or African American, C. American [place] or [place] Native Do you need or want an interpreter to communicate with a doctor or health care staff?: 0. No  Patient's Response To:  Health Literacy and Transportation Is the patient able to respond to health literacy and transportation needs?: Yes Health Literacy - How often do you need to have someone help you when you read instructions, pamphlets, or other written material from your doctor or pharmacy?: Never In the past 12 months, has lack of transportation kept you from medical appointments or from getting medications?: Yes In the past 12 months, has lack of transportation kept you from meetings, work, or from getting things needed for daily living?: Yes  Home Assistive Devices / Equipment Home Equipment: Agricultural consultant (2 wheels), BSC/3in1  Prior Device Use: Indicate devices/aids used by the patient prior to current illness, exacerbation or injury? Walker  Current Functional Level Cognition  Overall Cognitive Status: Within Functional Limits for tasks  assessed Orientation Level: Oriented X4    Extremity Assessment (includes Sensation/Coordination)  Upper Extremity Assessment: Generalized weakness, Right hand dominant LUE Deficits / Details: hx of carpal tunnel surgery 2 years ago, still numb in palm, drops items often. Pt has difficulty with FM/opposition LUE Sensation: decreased light touch LUE Coordination: decreased fine motor  Lower Extremity Assessment: Defer to PT evaluation RLE Deficits / Details: pt reports tingling in RLE RLE: Unable to fully assess due to pain    ADLs  Overall ADL's : Needs assistance/impaired Eating/Feeding: Set up, Sitting Grooming: Wash/dry hands, Wash/dry face, Oral care, Set up, Sitting Upper Body Bathing: Set up, Sitting Lower Body Bathing: Moderate assistance, Maximal assistance, Sitting/lateral leans Upper Body Dressing : Set up, Sitting Lower Body Dressing: Maximal assistance, Total assistance, Sitting/lateral leans Toilet Transfer: Minimal assistance, BSC/3in1 (incremental scoot transfer) Toilet Transfer Details (indicate cue type and reason): simulated with scoot transfer Toileting- Clothing Manipulation and Hygiene: Moderate assistance, Sitting/lateral lean Toileting - Clothing Manipulation Details (indicate cue type and reason): for thoroughness Functional mobility during ADLs: Moderate assistance, Rolling walker (2 wheels) General ADL Comments: Patient focusing on OOB activity and functional independence with ADLs, patient is close to being able to complete an incremental scoot transfer at mod I level, though continues to require light min A for RLE management and cues for hand placement and safety. Patient is a mod A - total A for lower body ADL management. Given patient's ability to progress, her previous level of independence, and her ability to have 24/7 support, OT recommending intensive therapy > 3 hours in order to return to prior level of function.    Mobility  Overal bed mobility:  Needs Assistance Bed Mobility: Supine to Sit Rolling: Supervision Sidelying to sit: Used rails, HOB elevated, Contact guard assist Supine to sit: Used rails, Min assist, HOB elevated Sit to supine: Min assist, HOB elevated, Used rails General bed mobility comments: Pt in recliner at beginning and end of session    Transfers  Overall transfer level: Needs assistance Equipment used: Rolling walker (2 wheels), None Transfers: Bed to chair/wheelchair/BSC, Sit to/from Stand Sit to Stand: +2 safety/equipment, Mod assist Bed to/from chair/wheelchair/BSC transfer type:: Lateral/scoot transfer, Stand pivot Stand pivot transfers: Min assist  Lateral/Scoot Transfers: Contact guard assist General transfer  comment: Lateral scoot from recliner to bed with CGA for safety and cues for technique and RLE NWB. Increased time required due to fatigue. Cues for improved LLE support for power up to scoot due to pt relying heavily on momentum and only scooting minimally with pt able to improve. STS from EOB and BSC with cues for hand placement and up to mod A for power up. Cues for RLE NWB with pt able to maintain. Pt able to stand pivot to [facility] with min A for RLE NWB and balance and dense cues for technique to pivot. Pt demonstrates difficulty increasing BUE support to pivot on LLE with tendency to hop on LLE, but is able to improve with cues and practice.    Ambulation / Gait / Stairs / Wheelchair Mobility  Ambulation/Gait General Gait Details: unable    Posture / Balance Dynamic Sitting Balance Sitting balance - Comments: EOB no assist. maintains NWB on Rt Balance Overall balance assessment: Needs assistance Sitting-balance support: No upper extremity supported, Feet supported Sitting balance-Leahy Scale: Good Sitting balance - Comments: EOB no assist. maintains NWB on Rt Standing balance support: Bilateral upper extremity supported, Reliant on assistive device for balance, During functional  activity Standing balance-Leahy Scale: Poor Standing balance comment: with RW support    Special needs/care consideration Skin surgical incision to RLE, Diabetic management yes, and Special service needs frequent tranfusions/heme onc following   Previous Home Environment (from acute therapy documentation) Living Arrangements: Children (Son) Available Help at Discharge: Family, Available PRN/intermittently (son works but sister can stay as long as needed) Type of Home: Apartment Home Layout: One level Home Access: Level entry Bathroom Shower/Tub: Sponge bathes at baseline, Tub/shower unit Bathroom Toilet: Standard (BSC on top) Bathroom Accessibility: Yes Home Care Services: No Additional Comments: Pt lives with son who works 3pm-3am  Discharge Living Setting Plans for Discharge Living Setting: Patient's home, Lives with (comment) (son) Type of Home at Discharge: Apartment Discharge Home Layout: One level Discharge Home Access: Level entry Discharge Bathroom Shower/Tub:  (sponge bathes at baseline) Discharge Bathroom Toilet: Standard (BSC over toilet to raise it) Discharge Bathroom Accessibility: Yes How Accessible: Accessible via walker Does the patient have any problems obtaining your medications?: No  Social/Family/Support Systems Anticipated Caregiver: son Clide Cliff) and sister Rinaldo Cloud) Anticipated Caregiver's Contact Information: Clide Cliff [phone] Ability/Limitations of Caregiver: Clide Cliff works, sister Rinaldo Cloud to come stay with pt while he works Engineer, structural Availability: 24/7 Discharge Plan Discussed with Primary Caregiver: Yes Is Caregiver In Agreement with Plan?: Yes Does Caregiver/Family have Issues with Lodging/Transportation while Pt is in Rehab?: No  Goals Patient/Family Goal for Rehab: PT/OT supervision to mod I, SLP n/a Expected length of stay: 7-10 Additional Information: Discharge plan: home at mod I w/c level, has help from son during non-work hours and sister while  son is at work Pt/Family Agrees to Admission and willing to participate: Yes Program Orientation Provided & Reviewed with Pt/Caregiver Including Roles  & Responsibilities: Yes  Decrease burden of Care through IP rehab admission: n/a  Possible need for SNF placement upon discharge:  Not anticipated.  Expected supervision to mod I level at discharge, likely primary w/c user, home with son and sister  Patient Condition: I have reviewed medical records from [facility], spoken with CM, and patient. I met with patient at the bedside for inpatient rehabilitation assessment.  Patient will benefit from ongoing PT and OT, can actively participate in 3 hours of therapy a day 5 days of the week, and can make measurable gains during  the admission.  Patient will also benefit from the coordinated team approach during an Inpatient Acute Rehabilitation admission.  The patient will receive intensive therapy as well as Rehabilitation physician, nursing, social worker, and care management interventions.  Due to safety, skin/wound care, disease management, medication administration, pain management, and patient education the patient requires 24 hour a day rehabilitation nursing.  The patient is currently min assist with mobility and basic ADLs.  Discharge setting and therapy post discharge at home with home health is anticipated.  Patient has agreed to participate in the Acute Inpatient Rehabilitation Program and will admit today.  Preadmission Screen Completed By:  Stephania Fragmin, PT, DPT  05/02/2023 11:29 AM ______________________________________________________________________   Discussed status with Dr. Riley Kill on 05/02/23  at 11:29 AM  and received approval for admission today.  Admission Coordinator:  Stephania Fragmin, PT, time 11:29 AM Dorna Bloom 05/02/23     MD Signature: [name], MD, [facility] Physical Medicine & Rehabilitation Medical Director Rehabilitation Services 05/02/2023

## 2023-04-30 NOTE — Progress Notes (Signed)
 Physical Therapy Treatment Patient Details Name: Peggy House MRN: [id number] DOB: October 19, 1960 Today's Date: 04/30/2023   History of Present Illness 63 y.o. female Presented with syncopal episode and admitted for symptomatic anemia and R ankle fracture, external reduction performed 12/29. Pt underwent rt ankle ORIF on 04/24/23.  PMH includes: hereditary Hemorrhagic Telangiectasia, blood loss anemia, MDD, GAD, arthritis, HTN, obesity, DM2.    PT Comments  Pt received sitting in the recliner and agreeable to session. Pt able to demonstrate lateral transfer from recliner to bed, however continues to require dense cues for technique and hand placement. Pt able to demonstrate improved LLE use with cues. Pt requests to use the [facility] and is able to stand from EOB and BSC with up to mod A and pivot with min A and dense cues. Pt continues to benefit from PT services to progress toward functional mobility goals.     If plan is discharge home, recommend the following: A lot of help with walking and/or transfers;A lot of help with bathing/dressing/bathroom;Assist for transportation   Can travel by private vehicle     No  Equipment Recommendations  Wheelchair (measurements PT);Wheelchair cushion (measurements PT);BSC/3in1    Recommendations for Other Services       Precautions / Restrictions Precautions Precautions: Fall Restrictions Weight Bearing Restrictions Per Provider Order: Yes RLE Weight Bearing Per Provider Order: Non weight bearing     Mobility  Bed Mobility               General bed mobility comments: Pt in recliner at beginning and end of session    Transfers Overall transfer level: Needs assistance Equipment used: Rolling walker (2 wheels), None Transfers: Bed to chair/wheelchair/BSC, Sit to/from Stand Sit to Stand: +2 safety/equipment, Mod assist Stand pivot transfers: Min assist        Lateral/Scoot Transfers: Contact guard assist General transfer comment:  Lateral scoot from recliner to bed with CGA for safety and cues for technique and RLE NWB. Increased time required due to fatigue. Cues for improved LLE support for power up to scoot due to pt relying heavily on momentum and only scooting minimally with pt able to improve. STS from EOB and BSC with cues for hand placement and up to mod A for power up. Cues for RLE NWB with pt able to maintain. Pt able to stand pivot to [facility] with min A for RLE NWB and balance and dense cues for technique to pivot. Pt demonstrates difficulty increasing BUE support to pivot on LLE with tendency to hop on LLE, but is able to improve with cues and practice.        Balance Overall balance assessment: Needs assistance Sitting-balance support: No upper extremity supported, Feet supported Sitting balance-Leahy Scale: Good     Standing balance support: Bilateral upper extremity supported, Reliant on assistive device for balance, During functional activity Standing balance-Leahy Scale: Poor Standing balance comment: with RW support                            Cognition Arousal: Alert Behavior During Therapy: WFL for tasks assessed/performed Overall Cognitive Status: Within Functional Limits for tasks assessed                                          Exercises General Exercises - Lower Extremity Long Arc Quad: AROM, Right, Seated, 10  reps Heel Slides: AROM, Seated, Right, 10 reps Straight Leg Raises: 10 reps, Right, Seated, AROM    General Comments        Pertinent Vitals/Pain Pain Assessment Pain Assessment: Faces Faces Pain Scale: Hurts a little bit Pain Location: R ankle Pain Descriptors / Indicators: Sore, Discomfort, Grimacing, Guarding Pain Intervention(s): Limited activity within patient's tolerance, Monitored during session, Repositioned     PT Goals (current goals can now be found in the care plan section) Acute Rehab PT Goals Patient Stated Goal: go home PT Goal  Formulation: With patient Time For Goal Achievement: 05/02/23 Progress towards PT goals: Progressing toward goals    Frequency    Min 1X/week       AM-PAC PT "6 Clicks" Mobility   Outcome Measure  Help needed turning from your back to your side while in a flat bed without using bedrails?: None Help needed moving from lying on your back to sitting on the side of a flat bed without using bedrails?: A Little Help needed moving to and from a bed to a chair (including a wheelchair)?: A Little Help needed standing up from a chair using your arms (e.g., wheelchair or bedside chair)?: A Lot Help needed to walk in hospital room?: Total Help needed climbing 3-5 steps with a railing? : Total 6 Click Score: 14    End of Session Equipment Utilized During Treatment: Gait belt Activity Tolerance: Patient tolerated treatment well Patient left: in chair;with call bell/phone within reach;with chair alarm set Nurse Communication: Mobility status PT Visit Diagnosis: Unsteadiness on feet (R26.81);Other abnormalities of gait and mobility (R26.89);Pain     Time: 7829-5621 PT Time Calculation (min) (ACUTE ONLY): 41 min  Charges:    $Therapeutic Exercise: 8-22 mins $Therapeutic Activity: 23-37 mins PT General Charges $$ ACUTE PT VISIT: 1 Visit                     Michaelle Adolphus, PTA Acute Rehabilitation Services Secure Chat Preferred  [phone]    Michaelle Adolphus 04/30/2023, 3:18 PM

## 2023-05-01 DIAGNOSIS — I10 Essential (primary) hypertension: Secondary | ICD-10-CM | POA: Diagnosis not present

## 2023-05-01 DIAGNOSIS — S82851D Displaced trimalleolar fracture of right lower leg, subsequent encounter for closed fracture with routine healing: Secondary | ICD-10-CM | POA: Diagnosis not present

## 2023-05-01 DIAGNOSIS — I780 Hereditary hemorrhagic telangiectasia: Secondary | ICD-10-CM | POA: Diagnosis not present

## 2023-05-01 DIAGNOSIS — E119 Type 2 diabetes mellitus without complications: Secondary | ICD-10-CM | POA: Diagnosis not present

## 2023-05-01 DIAGNOSIS — Z7984 Long term (current) use of oral hypoglycemic drugs: Secondary | ICD-10-CM | POA: Diagnosis not present

## 2023-05-01 DIAGNOSIS — D649 Anemia, unspecified: Secondary | ICD-10-CM | POA: Diagnosis not present

## 2023-05-01 LAB — GLUCOSE, CAPILLARY
Glucose-Capillary: 114 mg/dL — ABNORMAL HIGH (ref 70–99)
Glucose-Capillary: 141 mg/dL — ABNORMAL HIGH (ref 70–99)
Glucose-Capillary: 126 mg/dL — ABNORMAL HIGH (ref 70–99)
Glucose-Capillary: 117 mg/dL — ABNORMAL HIGH (ref 70–99)

## 2023-05-01 LAB — APTT: aPTT: 39 s — ABNORMAL HIGH (ref 24–36)

## 2023-05-01 LAB — CBC
HCT: 25.5 % — ABNORMAL LOW (ref 36.0–46.0)
Hemoglobin: 7.5 g/dL — ABNORMAL LOW (ref 12.0–15.0)
MCH: 26.8 pg (ref 26.0–34.0)
MCHC: 29.4 g/dL — ABNORMAL LOW (ref 30.0–36.0)
MCV: 91.1 fL (ref 80.0–100.0)
Platelets: 464 10*3/uL — ABNORMAL HIGH (ref 150–400)
RBC: 2.8 MIL/uL — ABNORMAL LOW (ref 3.87–5.11)
RDW: 17.2 % — ABNORMAL HIGH (ref 11.5–15.5)
WBC: 9.1 10*3/uL (ref 4.0–10.5)
nRBC: 0 % (ref 0.0–0.2)

## 2023-05-01 LAB — PROTIME-INR
INR: 1.9 — ABNORMAL HIGH (ref 0.8–1.2)
Prothrombin Time: 22.2 s — ABNORMAL HIGH (ref 11.4–15.2)

## 2023-05-01 LAB — PREPARE RBC (CROSSMATCH)

## 2023-05-01 MED ORDER — LISINOPRIL 20 MG PO TABS
20.0000 mg | ORAL_TABLET | Freq: Every day | ORAL | Status: DC
  Administered 2023-05-01 – 2023-05-02 (×2): 20 mg via ORAL
  Filled 2023-05-01 (×2): qty 1

## 2023-05-01 MED ORDER — OXYMETAZOLINE HCL 0.05 % NA SOLN
1.0000 | Freq: Two times a day (BID) | NASAL | Status: DC | PRN
  Administered 2023-05-01: 1 via NASAL
  Filled 2023-05-01: qty 30

## 2023-05-01 MED ORDER — SODIUM CHLORIDE 0.9% IV SOLUTION: Freq: Once | INTRAVENOUS | Status: AC

## 2023-05-01 NOTE — Plan of Care (Signed)

## 2023-05-01 NOTE — Progress Notes (Signed)
Inpatient Rehab Admissions Coordinator:   I have no beds available for this patient to admit to CIR today.  Will continue to follow for timing of potential admission pending bed availability.   Estill Dooms, PT, DPT Admissions Coordinator [phone] 05/01/23  10:10 AM

## 2023-05-01 NOTE — TOC Progression Note (Signed)
Transition of Care [facility]) - Progression Note    Patient Details  Name: Peggy House MRN: [id number] Date of Birth: 1961-01-25  [facility] CM/SW Contact  Janae Bridgeman, RN Phone Number: 05/01/2023, 2:56 PM  Clinical Narrative:    Patient is pending admission to CIR once an admission bed is available.  CM and MSW with [facility] Team will continue to follow the patient for TOC needs.        Expected Discharge Plan and Services                                               Social Determinants of Health (SDOH) Interventions SDOH Screenings   Food Insecurity: No Food Insecurity (04/13/2023)  Housing: High Risk (04/13/2023)  Transportation Needs: No Transportation Needs (04/13/2023)  Recent Concern: Transportation Needs - Unmet Transportation Needs (01/31/2023)  Utilities: Not At Risk (04/13/2023)  Recent Concern: Utilities - At Risk (01/31/2023)  Depression (PHQ2-9): High Risk (06/20/2022)  Tobacco Use: High Risk (04/24/2023)    Readmission Risk Interventions    04/18/2023    3:07 PM 04/15/2023    2:22 PM  Readmission Risk Prevention Plan  Transportation Screening Complete Complete  PCP or Specialist Appt within 5-7 Days  Complete  PCP or Specialist Appt within 3-5 Days Complete   Home Care Screening  Complete  Medication Review (RN CM)  Complete  HRI or Home Care Consult Complete   Social Work Consult for Recovery Care Planning/Counseling Complete   Palliative Care Screening Complete   Medication Review Oceanographer) Complete

## 2023-05-01 NOTE — Progress Notes (Signed)
HD#[id number] SUBJECTIVE:  Patient Summary: Peggy House is a 63 y.o. with a pertinent PMH of hereditary hemorrhagic telangiectasia, chronic anemia, hypertension, hyperlipidemia, GI bleed, and type 2 diabetes , who presented with syncopal episode and admitted for symptomatic anemia and R ankle fracture.   Overnight Events: No acute event.  Interim History:  Patient was examined at the bedside.  She had nosebleeding this morning which resolved with the Afrin nasal spray. Not endorsing shortness of breadth or chest pain.  Patient has been mobilizing with physical therapy in a chair.   OBJECTIVE:  Vital Signs: Vitals:   04/30/23 2149 05/01/23 0354 05/01/23 0735 05/01/23 1027  BP: (!) 166/58 (!) 154/61 (!) 149/64 (!) 122/56  Pulse: (!) 54 (!) 50 (!) 53 (!) 47  Resp: 19 19 18    Temp:  98 F (36.7 C) 98.4 F (36.9 C)   TempSrc:      SpO2: 100% 100% 99%   Weight:      Height:       Supplemental O2: Room Air SpO2: 99 % O2 Flow Rate (L/min): 2 L/min  Filed Weights   04/13/23 0313 04/24/23 1423  Weight: 104.1 kg 102.5 kg     Intake/Output Summary (Last 24 hours) at 05/01/2023 1416 Last data filed at 05/01/2023 1034 Gross per 24 hour  Intake 120 ml  Output 700 ml  Net -580 ml   Net IO Since Admission: -5,646.55 mL [05/01/23 1416]  Physical Exam: Physical Exam Constitutional:      Appearance: She is not ill-appearing.  Cardiovascular:     Rate and Rhythm: Normal rate.  Pulmonary:     Effort: Pulmonary effort is normal. CTABL. Auscultated front and back. Musculoskeletal:     Comments: R ankle in soft cast. R leg/foot is neurovascularly inact. Toes are warm and able to move.   Skin:    General: Skin is warm.  Neurological:     Mental Status: She is alert.  Psychiatric:        Mood and Affect: Mood normal.        Behavior: Behavior normal.   Patient Lines/Drains/Airways Status     Active Line/Drains/Airways     Name Placement date Placement time Site Days    Implanted Port 07/08/18 Right Chest 07/08/18  --  Chest  1753   External Urinary Catheter 04/22/23  1000  --  4   Incision (Closed) 04/24/23 Ankle Right 04/24/23  1747  -- 2   Pressure Injury 04/22/23 Buttocks Right;Left Stage 2 -  Partial thickness loss of dermis presenting as a shallow open injury with a red, pink wound bed without slough.  04/22/23  1000  -- 4             ASSESSMENT/PLAN:  Assessment: Principal Problem:   Closed right trimalleolar fracture Active Problems:   Acute on chronic blood loss anemia   Anxiety   Essential hypertension   Obesity, Class III, BMI 40-49.9 (morbid obesity) (HCC)   Cough   Type 2 diabetes mellitus with vascular disease (HCC)   Syncope   HHT (hereditary hemorrhagic telangiectasia) (HCC)   Thrombocytopenia (HCC)  Peggy House is a 63 y.o. woman with a PMH of hereditary hemorrhagic telangiectasia, chronic iron deficiency anemia, type 2 diabetes, tobacco use disorder, GAD who presented following a syncopal episode and admitted for symptomatic anemia and R ankle fracture.   Medical and surgical workup is complete. She is awaiting discharge to CIR.  Plan:  Symptomatic anemia Hereditary hemorrhagic telangiectasia She  had nose bleeding that has resolved. Hb 7.5, she has received 1 unit of transfusion, post H&H pending.  Patient has received about 9 units of blood during this admission, putting her at risks for clots.  She has a prior history of provoked DVT in 2010. Will discontinue anticoagulation with Xarelto, as that increases his risk for bleeding, and transition to SCDs.  Both protamine INR and APTT prolonged, consistent with decreased clotting factors.  On going conversation about getting FPP.  - CBC - Follow-up posttransfusion H&H - Xarelto discontinued - SCDs for DVT prophylaxis - Afrin nasal spray for nosebleeds. - For recurrent nosebleeds, attempt control with cold packs and pressure. Afrin nasal spray or  silver nitrate  can be tried if source of bleeding evident. If persistent, consider ENT for hemostasis.  Trimalleolar R Ankle Fracture Stable, endorses adequate pain control.  She is status post closed reduction and ORIF with orthopedics.  Right lower extremity wrapped in a cast.   Patient is pending admission to CIR once an admission bed is available.  Plan:  - Scheduled Tylenol 1 g 3 times daily, oxycodone 10mg  q4h prn.  For bowel regimen, Senna-docusate BID, Miralax BID.  - Elevate leg, ice packs  HTN BP elevated, will restart home dose of lisinopril 20 mg.   -Monitor. -Restart lisinopril 20 mg. - continue amlodipine 10 mg .  Type 2 diabetes Stable. Metformin was held on admission, due to normal A1c.  -Daily CBG. -continue holding metformin.  Tobacco use disorder Half pack a day smoker. -Can give nicotine patch if requested. Was held at admission due to surgery.  -No PFTs on file to formally diagnose COPD, resume home albuterol   Generalized anxiety Continue on trazodone 50prn, Celexa 20qd.  439 Remeron 7.5qd held at admission.    Best Practice: Diet: Cardiac diet IVF: None VTE: DOAC Code: DNR/DNI AB: None DISPO: Anticipated discharge to CIR, pending insurance authorization. Signature:  Laretta Bolster, M.D.  Internal Medicine Resident, PGY-1 [facility]  Pager: [phone] 2:16 PM, 05/01/2023   **Please contact the on call pager after 5 pm and on weekends at [phone].**

## 2023-05-02 ENCOUNTER — Inpatient Hospital Stay (HOSPITAL_COMMUNITY)
Admission: AD | Admit: 2023-05-02 | Discharge: 2023-05-19 | DRG: 560 | Disposition: A | Discharge status: Home Health | Payer: Medicaid Other | Source: Intra-hospital | Attending: Physical Medicine and Rehabilitation | Admitting: Physical Medicine and Rehabilitation

## 2023-05-02 DIAGNOSIS — Z66 Do not resuscitate: Secondary | ICD-10-CM | POA: Diagnosis present

## 2023-05-02 DIAGNOSIS — F17210 Nicotine dependence, cigarettes, uncomplicated: Secondary | ICD-10-CM | POA: Diagnosis not present

## 2023-05-02 DIAGNOSIS — F329 Major depressive disorder, single episode, unspecified: Secondary | ICD-10-CM | POA: Diagnosis present

## 2023-05-02 DIAGNOSIS — S82851K Displaced trimalleolar fracture of right lower leg, subsequent encounter for closed fracture with nonunion: Secondary | ICD-10-CM

## 2023-05-02 DIAGNOSIS — D62 Acute posthemorrhagic anemia: Secondary | ICD-10-CM | POA: Diagnosis not present

## 2023-05-02 DIAGNOSIS — R110 Nausea: Secondary | ICD-10-CM | POA: Diagnosis present

## 2023-05-02 DIAGNOSIS — R040 Epistaxis: Secondary | ICD-10-CM | POA: Diagnosis present

## 2023-05-02 DIAGNOSIS — S82891S Other fracture of right lower leg, sequela: Secondary | ICD-10-CM | POA: Diagnosis not present

## 2023-05-02 DIAGNOSIS — E669 Obesity, unspecified: Secondary | ICD-10-CM | POA: Diagnosis not present

## 2023-05-02 DIAGNOSIS — I780 Hereditary hemorrhagic telangiectasia: Secondary | ICD-10-CM | POA: Diagnosis present

## 2023-05-02 DIAGNOSIS — Z6838 Body mass index (BMI) 38.0-38.9, adult: Secondary | ICD-10-CM

## 2023-05-02 DIAGNOSIS — Z833 Family history of diabetes mellitus: Secondary | ICD-10-CM

## 2023-05-02 DIAGNOSIS — Z91030 Bee allergy status: Secondary | ICD-10-CM

## 2023-05-02 DIAGNOSIS — K5900 Constipation, unspecified: Secondary | ICD-10-CM | POA: Diagnosis not present

## 2023-05-02 DIAGNOSIS — W19XXXD Unspecified fall, subsequent encounter: Secondary | ICD-10-CM | POA: Diagnosis present

## 2023-05-02 DIAGNOSIS — F411 Generalized anxiety disorder: Secondary | ICD-10-CM | POA: Diagnosis not present

## 2023-05-02 DIAGNOSIS — I959 Hypotension, unspecified: Secondary | ICD-10-CM | POA: Diagnosis present

## 2023-05-02 DIAGNOSIS — S82891D Other fracture of right lower leg, subsequent encounter for closed fracture with routine healing: Secondary | ICD-10-CM | POA: Diagnosis not present

## 2023-05-02 DIAGNOSIS — D75839 Thrombocytosis, unspecified: Secondary | ICD-10-CM | POA: Diagnosis not present

## 2023-05-02 DIAGNOSIS — N179 Acute kidney failure, unspecified: Secondary | ICD-10-CM | POA: Diagnosis present

## 2023-05-02 DIAGNOSIS — Z794 Long term (current) use of insulin: Secondary | ICD-10-CM | POA: Diagnosis not present

## 2023-05-02 DIAGNOSIS — E119 Type 2 diabetes mellitus without complications: Secondary | ICD-10-CM | POA: Diagnosis present

## 2023-05-02 DIAGNOSIS — S82851A Displaced trimalleolar fracture of right lower leg, initial encounter for closed fracture: Secondary | ICD-10-CM | POA: Diagnosis present

## 2023-05-02 DIAGNOSIS — S82851S Displaced trimalleolar fracture of right lower leg, sequela: Secondary | ICD-10-CM | POA: Diagnosis not present

## 2023-05-02 DIAGNOSIS — Z800 Family history of malignant neoplasm of digestive organs: Secondary | ICD-10-CM

## 2023-05-02 DIAGNOSIS — D500 Iron deficiency anemia secondary to blood loss (chronic): Secondary | ICD-10-CM | POA: Diagnosis not present

## 2023-05-02 DIAGNOSIS — G8918 Other acute postprocedural pain: Secondary | ICD-10-CM | POA: Diagnosis not present

## 2023-05-02 DIAGNOSIS — Z7984 Long term (current) use of oral hypoglycemic drugs: Secondary | ICD-10-CM | POA: Diagnosis not present

## 2023-05-02 DIAGNOSIS — R001 Bradycardia, unspecified: Secondary | ICD-10-CM | POA: Diagnosis present

## 2023-05-02 DIAGNOSIS — F32A Depression, unspecified: Secondary | ICD-10-CM | POA: Diagnosis not present

## 2023-05-02 DIAGNOSIS — G4700 Insomnia, unspecified: Secondary | ICD-10-CM | POA: Diagnosis present

## 2023-05-02 DIAGNOSIS — K219 Gastro-esophageal reflux disease without esophagitis: Secondary | ICD-10-CM | POA: Diagnosis present

## 2023-05-02 DIAGNOSIS — E785 Hyperlipidemia, unspecified: Secondary | ICD-10-CM | POA: Diagnosis not present

## 2023-05-02 DIAGNOSIS — M25571 Pain in right ankle and joints of right foot: Secondary | ICD-10-CM | POA: Diagnosis not present

## 2023-05-02 DIAGNOSIS — S82851D Displaced trimalleolar fracture of right lower leg, subsequent encounter for closed fracture with routine healing: Principal | ICD-10-CM

## 2023-05-02 DIAGNOSIS — Z8041 Family history of malignant neoplasm of ovary: Secondary | ICD-10-CM

## 2023-05-02 DIAGNOSIS — F54 Psychological and behavioral factors associated with disorders or diseases classified elsewhere: Secondary | ICD-10-CM

## 2023-05-02 DIAGNOSIS — Z91018 Allergy to other foods: Secondary | ICD-10-CM

## 2023-05-02 DIAGNOSIS — S82891A Other fracture of right lower leg, initial encounter for closed fracture: Secondary | ICD-10-CM | POA: Diagnosis not present

## 2023-05-02 DIAGNOSIS — I10 Essential (primary) hypertension: Secondary | ICD-10-CM | POA: Diagnosis not present

## 2023-05-02 DIAGNOSIS — D649 Anemia, unspecified: Secondary | ICD-10-CM | POA: Diagnosis not present

## 2023-05-02 DIAGNOSIS — Z79899 Other long term (current) drug therapy: Secondary | ICD-10-CM

## 2023-05-02 DIAGNOSIS — Z832 Family history of diseases of the blood and blood-forming organs and certain disorders involving the immune mechanism: Secondary | ICD-10-CM

## 2023-05-02 DIAGNOSIS — S82899A Other fracture of unspecified lower leg, initial encounter for closed fracture: Secondary | ICD-10-CM | POA: Diagnosis not present

## 2023-05-02 DIAGNOSIS — Z888 Allergy status to other drugs, medicaments and biological substances status: Secondary | ICD-10-CM

## 2023-05-02 DIAGNOSIS — Z886 Allergy status to analgesic agent status: Secondary | ICD-10-CM

## 2023-05-02 DIAGNOSIS — Z841 Family history of disorders of kidney and ureter: Secondary | ICD-10-CM

## 2023-05-02 LAB — CBC
HCT: 26.5 % — ABNORMAL LOW (ref 36.0–46.0)
Hemoglobin: 8.1 g/dL — ABNORMAL LOW (ref 12.0–15.0)
MCH: 27.4 pg (ref 26.0–34.0)
MCHC: 30.6 g/dL (ref 30.0–36.0)
MCV: 89.5 fL (ref 80.0–100.0)
Platelets: 462 10*3/uL — ABNORMAL HIGH (ref 150–400)
RBC: 2.96 MIL/uL — ABNORMAL LOW (ref 3.87–5.11)
RDW: 16.7 % — ABNORMAL HIGH (ref 11.5–15.5)
WBC: 9.3 10*3/uL (ref 4.0–10.5)
nRBC: 0 % (ref 0.0–0.2)

## 2023-05-02 LAB — PREPARE FRESH FROZEN PLASMA

## 2023-05-02 LAB — BPAM FFP
Blood Product Expiration Date: 202501162359
ISSUE DATE / TIME: 202501162032
Unit Type and Rh: 6200

## 2023-05-02 LAB — TYPE AND SCREEN
ABO/RH(D): A NEG
Antibody Screen: NEGATIVE
Unit division: 0

## 2023-05-02 LAB — BPAM RBC
Blood Product Expiration Date: 202501272359
ISSUE DATE / TIME: 202501162339
Unit Type and Rh: 600

## 2023-05-02 LAB — GLUCOSE, CAPILLARY
Glucose-Capillary: 112 mg/dL — ABNORMAL HIGH (ref 70–99)
Glucose-Capillary: 105 mg/dL — ABNORMAL HIGH (ref 70–99)
Glucose-Capillary: 124 mg/dL — ABNORMAL HIGH (ref 70–99)

## 2023-05-02 MED ORDER — OXYMETAZOLINE HCL 0.05 % NA SOLN: 1.0000 | Freq: Two times a day (BID) | NASAL | Status: AC | PRN

## 2023-05-02 MED ORDER — BOOST / RESOURCE BREEZE PO LIQD CUSTOM
1.0000 | Freq: Three times a day (TID) | ORAL | Status: DC
  Administered 2023-05-02 – 2023-05-03 (×3): 1 via ORAL

## 2023-05-02 MED ORDER — CITALOPRAM HYDROBROMIDE 20 MG PO TABS
20.0000 mg | ORAL_TABLET | Freq: Every day | ORAL | Status: DC
  Administered 2023-05-03 – 2023-05-19 (×17): 20 mg via ORAL
  Filled 2023-05-02 (×17): qty 1

## 2023-05-02 MED ORDER — LISINOPRIL 20 MG PO TABS
20.0000 mg | ORAL_TABLET | Freq: Every day | ORAL | Status: DC
  Administered 2023-05-03 – 2023-05-04 (×2): 20 mg via ORAL
  Filled 2023-05-02 (×2): qty 1

## 2023-05-02 MED ORDER — TRAZODONE HCL 50 MG PO TABS: ORAL_TABLET | ORAL | Status: DC

## 2023-05-02 MED ORDER — AMLODIPINE BESYLATE 10 MG PO TABS
10.0000 mg | ORAL_TABLET | Freq: Every day | ORAL | Status: DC
  Administered 2023-05-03: 10 mg via ORAL
  Filled 2023-05-02: qty 1

## 2023-05-02 MED ORDER — OXYCODONE HCL 10 MG PO TABS: 10.0000 mg | ORAL_TABLET | ORAL | Status: DC | PRN

## 2023-05-02 MED ORDER — ALUM & MAG HYDROXIDE-SIMETH 200-200-20 MG/5ML PO SUSP
30.0000 mL | ORAL | Status: DC | PRN
  Administered 2023-05-11: 30 mL via ORAL

## 2023-05-02 MED ORDER — ACETAMINOPHEN 500 MG PO TABS
1000.0000 mg | ORAL_TABLET | Freq: Three times a day (TID) | ORAL | Status: DC
  Administered 2023-05-02 – 2023-05-19 (×51): 1000 mg via ORAL
  Filled 2023-05-02 (×51): qty 2

## 2023-05-02 MED ORDER — SORBITOL 70 % SOLN: 30.0000 mL | Freq: Every day | Status: DC | PRN

## 2023-05-02 MED ORDER — AMINOCAPROIC ACID 500 MG PO TABS
1.0000 g | ORAL_TABLET | Freq: Two times a day (BID) | ORAL | Status: DC
  Administered 2023-05-02 – 2023-05-19 (×34): 1 g via ORAL
  Filled 2023-05-02 (×35): qty 2

## 2023-05-02 MED ORDER — POLYETHYLENE GLYCOL 3350 17 G PO PACK
17.0000 g | PACK | Freq: Two times a day (BID) | ORAL | Status: DC
  Administered 2023-05-03 – 2023-05-19 (×22): 17 g via ORAL
  Filled 2023-05-02 (×33): qty 1

## 2023-05-02 MED ORDER — CHLORHEXIDINE GLUCONATE CLOTH 2 % EX PADS: 6.0000 | MEDICATED_PAD | Freq: Every day | CUTANEOUS | Status: DC

## 2023-05-02 MED ORDER — TRIMETHOBENZAMIDE HCL 100 MG/ML IM SOLN: 200.0000 mg | Freq: Three times a day (TID) | INTRAMUSCULAR | Status: DC | PRN

## 2023-05-02 MED ORDER — METHOCARBAMOL 500 MG PO TABS: 500.0000 mg | ORAL_TABLET | Freq: Four times a day (QID) | ORAL | Status: DC | PRN

## 2023-05-02 MED ORDER — SENNOSIDES-DOCUSATE SODIUM 8.6-50 MG PO TABS
1.0000 | ORAL_TABLET | Freq: Two times a day (BID) | ORAL | Status: DC
Start: 2023-05-02 — End: 2023-05-06
  Administered 2023-05-02 – 2023-05-06 (×4): 1 via ORAL
  Filled 2023-05-02 (×7): qty 1

## 2023-05-02 MED ORDER — HEPARIN SOD (PORK) LOCK FLUSH 100 UNIT/ML IV SOLN
500.0000 [IU] | INTRAVENOUS | Status: AC | PRN
  Administered 2023-05-02: 500 [IU]
  Filled 2023-05-02: qty 5

## 2023-05-02 MED ORDER — OXYCODONE HCL 5 MG PO TABS
10.0000 mg | ORAL_TABLET | ORAL | Status: DC | PRN
  Administered 2023-05-02 – 2023-05-09 (×21): 10 mg via ORAL
  Filled 2023-05-02 (×21): qty 2

## 2023-05-02 MED ORDER — TRAZODONE HCL 50 MG PO TABS
50.0000 mg | ORAL_TABLET | Freq: Every evening | ORAL | Status: DC | PRN
  Administered 2023-05-06 – 2023-05-18 (×11): 50 mg via ORAL
  Filled 2023-05-02 (×11): qty 1

## 2023-05-02 MED ORDER — FLEET ENEMA RE ENEM: 1.0000 | ENEMA | Freq: Once | RECTAL | Status: DC | PRN

## 2023-05-02 MED ORDER — GUAIFENESIN-DM 100-10 MG/5ML PO SYRP: 10.0000 mL | ORAL_SOLUTION | Freq: Four times a day (QID) | ORAL | Status: DC | PRN

## 2023-05-02 MED ORDER — SENNOSIDES-DOCUSATE SODIUM 8.6-50 MG PO TABS: 1.0000 | ORAL_TABLET | Freq: Every evening | ORAL | Status: DC | PRN

## 2023-05-02 MED ORDER — ACETAMINOPHEN 500 MG PO TABS: 1000.0000 mg | ORAL_TABLET | Freq: Three times a day (TID) | ORAL | Status: DC

## 2023-05-02 MED ORDER — PANTOPRAZOLE SODIUM 40 MG PO TBEC
40.0000 mg | DELAYED_RELEASE_TABLET | Freq: Two times a day (BID) | ORAL | Status: DC
  Administered 2023-05-02 – 2023-05-19 (×34): 40 mg via ORAL
  Filled 2023-05-02 (×34): qty 1

## 2023-05-02 NOTE — Plan of Care (Signed)

## 2023-05-02 NOTE — Progress Notes (Signed)
Physical Therapy Treatment Patient Details Name: Peggy House MRN: [id number] DOB: 1960-11-08 Today's Date: 05/02/2023   History of Present Illness 62 y.o. female Presented with syncopal episode and admitted for symptomatic anemia and R ankle fracture, external reduction performed 12/29. Pt underwent rt ankle ORIF on 04/24/23.  PMH includes: hereditary Hemorrhagic Telangiectasia, blood loss anemia, MDD, GAD, arthritis, HTN, obesity, DM2.    PT Comments  Great progress today, able to stand from slightly elevated bed surface with mod assist and pivot to recliner with RW for support, min A level while safely maintaining NWB on RLE. Took several small hops forwards and backwards with min assist, again safely maintaining NWB; demonstrates some minor posterior instability, needs assist for RW placement. Reviewed LE exercises. Patient will continue to benefit from skilled physical therapy services to further improve independence with functional mobility.     If plan is discharge home, recommend the following: A lot of help with walking and/or transfers;A lot of help with bathing/dressing/bathroom;Assist for transportation   Can travel by private vehicle     No  Equipment Recommendations  Wheelchair (measurements PT);Wheelchair cushion (measurements PT);BSC/3in1    Recommendations for Other Services Rehab consult     Precautions / Restrictions Precautions Precautions: Fall Restrictions Weight Bearing Restrictions Per Provider Order: Yes RLE Weight Bearing Per Provider Order: Non weight bearing     Mobility  Bed Mobility Overal bed mobility: Needs Assistance Bed Mobility: Supine to Sit Rolling: Supervision Sidelying to sit: Used rails, Contact guard assist       General bed mobility comments: Supervision to roll using rail, CGA to rise to EOB, a bit effortful with light guidance to square, maintains NWB.    Transfers Overall transfer level: Needs assistance Equipment used:  Rolling walker (2 wheels) Transfers: Bed to chair/wheelchair/BSC, Sit to/from Stand Sit to Stand: Mod assist, From elevated surface Stand pivot transfers: Min assist         General transfer comment: Mod assist for boost to stand, bed slightly elevated, reviewed appropriate hand placement. Min assist for RW control, cues for sequencing, slight balance correction due to posterior instability with pivot to recliner.    Ambulation/Gait Ambulation/Gait assistance: Min assist Gait Distance (Feet): 4 Feet Assistive device: Rolling walker (2 wheels) Gait Pattern/deviations:  (hop to) Gait velocity: decr     General Gait Details: Educated on sequencing, with min assist for RW placement and intermittently for balance (posterior lean at times.) Able to hop several steps forward away from recliner, and backwards to recliner again. Maintains NWB on RLE at all times.   Stairs             Wheelchair Mobility     Tilt Bed    Modified Rankin (Stroke Patients Only)       Balance Overall balance assessment: Needs assistance Sitting-balance support: No upper extremity supported, Feet supported Sitting balance-Leahy Scale: Good     Standing balance support: Bilateral upper extremity supported, Reliant on assistive device for balance, During functional activity Standing balance-Leahy Scale: Poor Standing balance comment: with RW support                            Cognition Arousal: Alert Behavior During Therapy: WFL for tasks assessed/performed Overall Cognitive Status: Within Functional Limits for tasks assessed  Exercises General Exercises - Lower Extremity Ankle Circles/Pumps: AROM, Left, 10 reps, Supine (Toe flex/ext on Rt) Quad Sets: Strengthening, Both, 10 reps, Supine Gluteal Sets: Strengthening, Both, 10 reps, Supine Long Arc Quad: Right, Seated, 10 reps, Strengthening Hip ABduction/ADduction:  Strengthening, Both, 10 reps, Supine Straight Leg Raises: 10 reps, Right, Seated, AROM    General Comments        Pertinent Vitals/Pain Pain Assessment Pain Assessment: Faces Faces Pain Scale: Hurts a little bit Pain Location: R ankle Pain Descriptors / Indicators: Sore, Discomfort, Grimacing, Guarding (Nausea) Pain Intervention(s): Monitored during session, Repositioned    Home Living Family/patient expects to be discharged to:: Inpatient rehab Living Arrangements: Children                      Prior Function            PT Goals (current goals can now be found in the care plan section) Acute Rehab PT Goals Patient Stated Goal: go home PT Goal Formulation: With patient Time For Goal Achievement: 05/02/23 Potential to Achieve Goals: Good Progress towards PT goals: Progressing toward goals    Frequency    Min 1X/week      PT Plan      Co-evaluation              AM-PAC PT "6 Clicks" Mobility   Outcome Measure  Help needed turning from your back to your side while in a flat bed without using bedrails?: None Help needed moving from lying on your back to sitting on the side of a flat bed without using bedrails?: A Little Help needed moving to and from a bed to a chair (including a wheelchair)?: A Little Help needed standing up from a chair using your arms (e.g., wheelchair or bedside chair)?: A Lot Help needed to walk in hospital room?: A Lot Help needed climbing 3-5 steps with a railing? : Total 6 Click Score: 15    End of Session Equipment Utilized During Treatment: Gait belt Activity Tolerance: Patient tolerated treatment well Patient left: in chair;with call bell/phone within reach;with chair alarm set Nurse Communication: Mobility status PT Visit Diagnosis: Unsteadiness on feet (R26.81);Other abnormalities of gait and mobility (R26.89);Pain Pain - Right/Left: Right Pain - part of body: Ankle and joints of foot     Time: 1150-1207 PT Time  Calculation (min) (ACUTE ONLY): 17 min  Charges:      PT General Charges $$ ACUTE PT VISIT: 1 Visit                     Kathlyn Sacramento, PT, DPT [facility]  Rehabilitation Services Physical Therapist Office: [phone] Website: [email or web address]    Berton Mount 05/02/2023, 2:13 PM

## 2023-05-02 NOTE — Discharge Instructions (Addendum)
FOLLOW-UP INSTRUCTIONS:  Thank you for allowing Peggy House to be part of your care. You were hospitalized for Closed right trimalleolar fracture.  Please follow up with the following providers: A. Olegario Messier, MD, [address] [place] [address], [phone]    Please note these changes made to your medications:   A. Medications to continue: Current Meds  Medication Sig   Accu-Chek Softclix Lancets lancets Use to check blood sugar before breakfast and before dinner while on steroids (Patient taking differently: 1 each by Other route See admin instructions. Use to check blood sugar before breakfast and before dinner while on steroids)   ALPRAZolam (XANAX) 0.5 MG tablet TAKE 1 TABLET BY MOUTH AT BEDTIME AS NEEDED FOR ANXIETY OR SLEEP   aminocaproic acid (AMICAR) 500 MG tablet Take 2 tablets (1,000 mg total) by mouth every 12 (twelve) hours.   amLODipine (NORVASC) 10 MG tablet TAKE 1 TABLET(10 MG) BY MOUTH DAILY (Patient taking differently: Take 10 mg by mouth daily.)   citalopram (CELEXA) 20 MG tablet Take 1 tablet (20 mg total) by mouth daily.   glucose blood (ACCU-CHEK GUIDE) test strip Check blood sugar 2 times per day while on steroids before breakfast and dinner (Patient taking differently: 1 each by Other route See admin instructions. Check blood sugar 2 times per day while on steroids before breakfast and dinner)   lisinopril (ZESTRIL) 20 MG tablet Take 20 mg by mouth daily.   metFORMIN (GLUCOPHAGE) 500 MG tablet TAKE 1 TABLET(500 MG) BY MOUTH TWICE DAILY WITH A MEAL   [Paused] mirtazapine (REMERON) 7.5 MG tablet Take 1 tablet (7.5 mg total) by mouth at bedtime.   nicotine (NICODERM CQ - DOSED IN MG/24 HOURS) 14 mg/24hr patch Place 1 patch (14 mg total) onto the skin daily. (Patient taking differently: Place 14 mg onto the skin daily.)   ondansetron (ZOFRAN) 4 MG tablet Take 1 tablet (4 mg total) by mouth every 8 (eight) hours as needed for nausea or vomiting.   pantoprazole (PROTONIX)  40 MG tablet TAKE 1 TABLET(40 MG) BY MOUTH TWICE DAILY (Patient taking differently: Take 40 mg by mouth daily.)   PROAIR HFA 108 (90 Base) MCG/ACT inhaler INHALE 1 TO 2 PUFFS INTO THE LUNGS EVERY 6 HOURS AS NEEDED FOR WHEEZING OR SHORTNESS OF BREATH (Patient taking differently: Inhale 1-2 puffs into the lungs every 6 (six) hours as needed for wheezing or shortness of breath.)   TYLENOL PM EXTRA STRENGTH 500-25 MG TABS tablet Take 2 tablets by mouth at bedtime as needed (for sleep, knee pain).   [DISCONTINUED] traZODone (DESYREL) 100 MG tablet TAKE 1 TABLET BY MOUTH AT BEDTIME      B. Medications to start:  Current Facility-Administered Medications:    acetaminophen (TYLENOL) tablet 1,000 mg, 1,000 mg, Oral, Q8H, Patel, Amar, DO, 1,000 mg at 05/02/23 0619   albuterol (PROVENTIL) (2.5 MG/3ML) 0.083% nebulizer solution 2.5 mg, 2.5 mg, Inhalation, Q6H PRN, Allena Katz, Amar, DO   aminocaproic acid (AMICAR) tablet 1 g, 1 g, Oral, Q12H, Juberg, Christopher, DO, 1 g at 05/02/23 1036   amLODipine (NORVASC) tablet 10 mg, 10 mg, Oral, Daily, Juberg, Christopher, DO, 10 mg at 05/02/23 1036   Chlorhexidine Gluconate Cloth 2 % PADS 6 each, 6 each, Topical, Daily, Miguel Aschoff, MD, 6 each at 05/01/23 0839   citalopram (CELEXA) tablet 20 mg, 20 mg, Oral, Daily, Patel, Amar, DO, 20 mg at 05/02/23 1035   dextromethorphan-guaiFENesin (MUCINEX DM) 30-600 MG per 12 hr tablet 2 tablet, 2  tablet, Oral, BID PRN, Dickie La, MD   feeding supplement (BOOST / RESOURCE BREEZE) liquid 1 Container, 1 Container, Oral, TID BM, Mercie Eon, MD, 1 Container at 05/02/23 1037   heparin lock flush 100 unit/mL, 500 Units, Intracatheter, Prior to discharge, Mercie Eon, MD   lisinopril (ZESTRIL) tablet 20 mg, 20 mg, Oral, Daily, Rana Snare, DO, 20 mg at 05/02/23 1035   oxyCODONE (Oxy IR/ROXICODONE) immediate release tablet 10 mg, 10 mg, Oral, Q4H PRN, Dickie La, MD, 10 mg at 05/02/23 0322   oxymetazoline (AFRIN) 0.05 %  nasal spray 1 spray, 1 spray, Each Nare, BID PRN, Laretta Bolster, MD, 1 spray at 05/01/23 1305   pantoprazole (PROTONIX) EC tablet 40 mg, 40 mg, Oral, BID, Patel, Amar, DO, 40 mg at 05/02/23 1035   polyethylene glycol (MIRALAX / GLYCOLAX) packet 17 g, 17 g, Oral, BID, Juberg, Christopher, DO, 17 g at 05/01/23 1601   senna-docusate (Senokot-S) tablet 1 tablet, 1 tablet, Oral, BID, Juberg, Christopher, DO, 1 tablet at 04/30/23 2132   sodium chloride flush (NS) 0.9 % injection 10-40 mL, 10-40 mL, Intracatheter, Q12H, Miguel Aschoff, MD, 10 mL at 05/02/23 1037   traZODone (DESYREL) tablet 50 mg, 50 mg, Oral, QHS PRN, Modena Slater, DO, 50 mg at 05/01/23 2035   trimethobenzamide (TIGAN) injection 200 mg, 200 mg, Intramuscular, Q8H PRN, Rana Snare, DO, 200 mg at 05/02/23 1055  Facility-Administered Medications Ordered in Other Encounters:    diphenhydrAMINE (BENADRYL) 25 mg capsule, , , ,    diphenhydrAMINE (BENADRYL) 25 mg capsule, , , ,    phenylephrine (NEO-SYNEPHRINE) 1 % nasal drops 2 drop, 2 drop, Left Nare, Once, Malachy Mood, MD   C. Medications to discontinue:  Ondansetron   Please make sure to return to the hospital if you have worsening shortness of breath, or uncontrolled nose bleeds.  Please call our clinic if you have any questions or concerns, we may be able to help and keep you from a long and expensive emergency room wait. Our clinic and after hours phone number is [phone], the best time to call is Monday through Friday 9 am to 4 pm but there is always someone available 24/7 if you have an emergency. If you need medication refills please notify your pharmacy one week in advance and they will send Peggy House a request.

## 2023-05-02 NOTE — Discharge Summary (Signed)
Physician Discharge Summary  Patient ID: Peggy House MRN: [id number] DOB/AGE: 1960/11/30 63 y.o.  Admit date: 05/02/2023 Discharge date: 05/19/2023  Discharge Diagnoses:  Principal Problem:   Closed right trimalleolar fracture Active problems: Debility secondary to right ankle fracture Symptomatic anemia Chronic depression Insomnia Hypertension Hyperlipidemia GERD Hereditary hemorrhagic telangiectasia Chronic anemia Acute blood loss anemia Intermittent epistaxis Tobacco use Diabetes mellitus type 2 Thrombocytosis Nausea Anxiety Iron deficiency anemia Bradycardia AKI  Discharged Condition: stable  Significant Diagnostic Studies: none  Labs:  Basic Metabolic Panel: Recent Labs  Lab 05/19/23 0524  NA 140  K 4.2  CL 107  CO2 25  GLUCOSE 107*  BUN 10  CREATININE 0.89  CALCIUM 8.9    CBC: Recent Labs  Lab 05/18/23 0341 05/19/23 0524  WBC 7.5 6.7  NEUTROABS 5.5  --   HGB 8.1* 8.4*  HCT 27.7* 28.7*  MCV 88.5 88.9  PLT 175 178    Brief HPI:   Peggy House is a 63 y.o. female with a history of HTT presented to the ED on 04/13/2023 reporting syncopal episode and fall resulting in right ankle fracture.  She exhibited profound anemia on lab work with a hemoglobin 5.1. She underwent ORIF by Dr. Hulda Humphrey on 1/09 and she is NWB for 6-8 weeks. She continued to have nose bleeds, per primary team, the risk of chemoppx for DVT outweighs the benefits. Xarelto discontinued and SCDs placed for DVT ppx. Transfused additional unit of pRBC for a total of 10 units since admission with one unit of FFP. Follows with Dr. Malachy Mood. Cardiac workup for syncope, echo showed grade 2 diastolic dysfunction.  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 s/p IR embolization of gastric artery branch vessels on 12/10/17, cauterization of stomach for severe GI bleed in 11/2018, and epinephrine injection for bleeding ulcer on 11/03/20. She has required  frequent blood transfusions, IV iron and bevacizumab. Has indwelling right internal jugular venous access device. PMH significant for DM-2, hypertension, depression. Pt able to stand pivot to [facility] with min A for RLE NWB and balance and dense cues for technique to pivot. Pt demonstrates difficulty increasing BUE support to pivot on LLE with tendency to hop on LLE, but is able to improve with cues and practice.   Hospital Course: Peggy House was admitted to rehab 05/02/2023 for inpatient therapies to consist of PT, ST and OT at least three hours five days a week. Past admission physiatrist, therapy team and rehab RN have worked together to provide customized collaborative inpatient rehab. Follow-up labs on 1/20 with improvement of hgb to 8.8. bradycardic to on 1/22 and EKG performed>>sinus bradycardia. No syncope or chest pain. Reviewing VS, her heart rate has consistently run  50-60s. Cardiology consultation obtained. Thyroid function tests obtained. Decrease amlodipine 75% and add hydralazine small dose bid for reflex tachycardia and BP control.  Seen in follow-up by Dr. Hulda Humphrey on 1/24 and okay to transition to removable walking boot.  Splint was removed as well as sutures.  She will still be nonweightbearing with the boot on and can remove it for hygiene and range of motion with physical therapy.  Continue nonweightbearing for approximately 8 to 12 weeks postoperative.  Serum creatinine up to 1.271/25 and oral fluids were encouraged.  Patient reported significant fatigue and inquired about her daily iron intravenous supplements.  Discussed with Dr. Mosetta Putt in order [place] fear 300 mg/week x 3 weeks.  Brief episode of epistaxis 1/31 resolved spontaneously.  Follow-up  labs obtained on 2/03 with normal BMP and stable H&H. She received Venofer again on 2/03.    Blood pressures were monitored on TID basis and amlodipine 10 mg daily and lisinopril 20 mg daily continued. Amlodipine decreased to 5 mg on 1/18 due to  diastolic hypotension. Lisinopril decreased to 10 mg on 1/19. Changed amlodipine back to 10 mg daily on 1/20. Decrease amlodipine 75% and add hydralazine small dose bid for reflex tachycardia and BP control.  Amlodipine discontinued on 1/25.  Lisinopril decreased further to 5 mg daily on 1/25 due to hypotension, but increased back to 10 mg on 2/02 due to hypertension.  Hydralazine 10 mg 3 times daily resumed on 2/03.    Diabetes has been monitored with CBG checks and remained controlled without medication. CBGs discontinued.    Rehab course: During patient's stay in rehab weekly team conferences were held to monitor patient's progress, set goals and discuss barriers to discharge. At admission, patient required  Min/MaxA   with basic self-care skills and min with mobility.  She has had improvement in activity tolerance, balance, postural control as well as ability to compensate for deficits. She has had improvement in functional use RUE/LUE  and RLE/LLE as well as improvement in awareness. Patient has met 6 of 7 long term goals due to improved activity tolerance, improved balance, ability to compensate for deficits, and improved coordination.  Patient to discharge at overall CGA-Min Assist level.  Patient's care partner is independent to provide the necessary physical and cognitive assistance at discharge. Family did not attend any hands on training sessions despite multiple attempts made to arrange by CSW.   Home Health:   PT      OT      ST      SNA                           Agency: [facility]     Discharge disposition: 06-Home-Health Care Svc     Diet: regular  Special Instructions: No driving, alcohol consumption or tobacco use.  Nonweightbearing right lower extremity for 8 to 12 weeks postop. Discharge Instructions     Ambulatory referral to Physical Medicine Rehab   Complete by: As directed    Hospital follow-up   Discharge patient   Complete by: As directed    Discharge  disposition: 06-Home-Health Care Svc   Discharge patient date: 05/19/2023      Allergies as of 05/19/2023       Reactions   Feraheme [ferumoxytol] Other (See Comments)   Muscle tetany, encephalopathy, fatigue, nausea (reaction to injection)   Nsaids Other (See Comments)   Stomach bleeding episodes; Tylenol is OK   Tomato Hives   Iron (ferrous Sulfate) [ferrous Sulfate Er] Other (See Comments)   Shock   Wasp Venom Swelling        Medication List     STOP taking these medications    amLODipine 10 MG tablet Commonly known as: NORVASC   metFORMIN 500 MG tablet Commonly known as: GLUCOPHAGE   mirtazapine 7.5 MG tablet Commonly known as: REMERON       TAKE these medications    Accu-Chek Guide test strip Generic drug: glucose blood Check blood sugar 2 times per day while on steroids before breakfast and dinner What changed:  how much to take how to take this when to take this   Accu-Chek Softclix Lancets lancets Use to check blood sugar before breakfast and  before dinner while on steroids What changed:  how much to take how to take this when to take this   acetaminophen 325 MG tablet Commonly known as: Tylenol Take 2 tablets (650 mg total) by mouth every 6 (six) hours as needed. What changed:  medication strength how much to take when to take this reasons to take this   ALPRAZolam 0.5 MG tablet Commonly known as: XANAX TAKE 1 TABLET BY MOUTH AT BEDTIME AS NEEDED FOR ANXIETY OR SLEEP   aminocaproic acid 500 MG tablet Commonly known as: Amicar Take 2 tablets (1,000 mg total) by mouth every 12 (twelve) hours.   citalopram 20 MG tablet Commonly known as: CeleXA Take 1 tablet (20 mg total) by mouth daily.   lisinopril 20 MG tablet Commonly known as: ZESTRIL Take 1 tablet (20 mg total) by mouth daily.   nicotine 14 mg/24hr patch Commonly known as: NICODERM CQ - dosed in mg/24 hours Place 1 patch (14 mg total) onto the skin daily. What changed: when to  take this   oxyCODONE 5 MG immediate release tablet Commonly known as: Oxy IR/ROXICODONE Take 1-2 tablets (5-10 mg total) by mouth every 4 (four) hours as needed for severe pain (pain score 7-10) (Try oral before IV medicine.). What changed:  medication strength how much to take   pantoprazole 40 MG tablet Commonly known as: PROTONIX TAKE 1 TABLET(40 MG) BY MOUTH TWICE DAILY What changed: See the new instructions.   ProAir HFA 108 (90 Base) MCG/ACT inhaler Generic drug: albuterol INHALE 1 TO 2 PUFFS INTO THE LUNGS EVERY 6 HOURS AS NEEDED FOR WHEEZING OR SHORTNESS OF BREATH What changed: See the new instructions.   Senna-S 8.6-50 MG tablet Generic drug: senna-docusate Take 1 tablet by mouth at bedtime. What changed:  when to take this reasons to take this   traZODone 50 MG tablet Commonly known as: DESYREL Take 1 tablet (50 mg total) by mouth at bedtime as needed for sleep (if). What changed:  how much to take how to take this when to take this reasons to take this additional instructions        Follow-up Information     Raulkar, Drema Pry, MD Follow up.   Specialty: Physical Medicine and Rehabilitation Why: office will call you to arrange your appt (sent) Contact information: [address] [place] [address] [phone]         Olegario Messier, MD Follow up.   Specialty: Internal Medicine Why: Call the office in 1-2 days to make arrangements for hospital follow-up appointment. Contact information: [address] [place] [address] [phone]         Luci Bank, MD Follow up.   Specialty: Orthopedic Surgery Why: Call the office in 1-2 days to make arrangements for hospital follow-up appointment. Contact information: [address] [place] [address] [phone]         Malachy Mood, MD Follow up.   Specialties: Hematology, Oncology Why: Call the office in 1-2 days to make arrangements for hospital follow-up  appointment. Contact information: [address] [place] [address] [phone]         Orpah Cobb, MD. Schedule an appointment as soon as possible for a visit in 1 month(s).   Specialty: Cardiology Contact information: [address] [place] [address] [phone]                 Signed: Milinda Antis, PA-C 05/20/2023, 11:16 AM

## 2023-05-02 NOTE — Progress Notes (Signed)
[name], MD  Physician Physical Medicine and Rehabilitation   Consult Note    Signed   Date of Service: 04/29/2023 10:12 AM  Related encounter: ED to Hosp-Admission (Current) from 04/13/2023 in [facility]   Signed     Expand All Collapse All           Physical Medicine and Rehabilitation Consult Reason for Consult:impaired functional mobility after fall Referring Physician: Lafonda Mosses     HPI: Peggy House is a 63 y.o. female with a history of hereditary hemorrhagic telangiectasia, chronic iron deficiency anemia, diabetes, morbid obesity who suffered a syncopal episode and fall on 04/13/2023 resulting in a trimalleolar right ankle fracture.  She has required blood transfusions for symptomatic anemia here in the hospital as well as on an outpatient basis prior to this admit.  Source has been GI but also primarily epistaxis.  She had been requiring weekly transfusions as an outpatient.  Blood pressure has been borderline to high requiring adjustment of regimen.  Patient was up with physical therapy yesterday and was mod assist for sit to stand transfers.  She has tolerated standing for about 40 seconds but has not been able to ambulate as of yet.  Patient lives with her son in a 1 level apartment with level entry.  She uses a rolling walker for ambulation and needed some assistance with ADLs prior to admit.  Son works from 3 PM to 3 AM daily   Review of Systems  Constitutional:  Positive for malaise/fatigue. Negative for fever.  HENT:  Negative for hearing loss.   Eyes:  Negative for blurred vision and double vision.  Respiratory:  Positive for shortness of breath. Negative for cough.   Cardiovascular:  Positive for leg swelling. Negative for chest pain.  Gastrointestinal:  Negative for nausea and vomiting.  Genitourinary:  Negative for dysuria.  Musculoskeletal:  Positive for falls and myalgias.  Skin: Negative.   Neurological:  Positive for  dizziness and weakness.  Endo/Heme/Allergies:  Bruises/bleeds easily.  Psychiatric/Behavioral:  Negative for depression.         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             Past Surgical History:  Procedure Laterality Date   ABDOMINAL HYSTERECTOMY       CARPAL TUNNEL RELEASE   05/13/2011    Procedure: CARPAL TUNNEL RELEASE;  Surgeon: Mable Paris, MD;  Location: [facility];  Service: Orthopedics;  Laterality: Left;   COLONOSCOPY N/A 03/02/2020    Procedure: COLONOSCOPY;  Surgeon: Bernette Redbird, MD;  Location: [facility] ENDOSCOPY;  Service: Endoscopy;  Laterality: N/A;   COLONOSCOPY WITH PROPOFOL N/A 04/28/2014    Procedure: COLONOSCOPY WITH PROPOFOL;  Surgeon: Florencia Reasons, MD;  Location: [facility] ENDOSCOPY;  Service: Endoscopy;  Laterality: N/A;   DG TOES*L*   2/10    rt   DILATION AND CURETTAGE OF UTERUS       ENTEROSCOPY N/A 10/17/2017    Procedure: ENTEROSCOPY;  Surgeon: Kathi Der,  MD;  Location: [facility] ENDOSCOPY;  Service: Gastroenterology;  Laterality: N/A;   ESOPHAGOGASTRODUODENOSCOPY N/A 04/10/2014    Procedure: ESOPHAGOGASTRODUODENOSCOPY (EGD);  Surgeon: Shirley Friar, MD;  Location: [facility] ENDOSCOPY;  Service: Endoscopy;  Laterality: N/A;   ESOPHAGOGASTRODUODENOSCOPY N/A 05/10/2017    Procedure: ESOPHAGOGASTRODUODENOSCOPY (EGD);  Surgeon: Bernette Redbird, MD;  Location: [facility] ENDOSCOPY;  Service: Endoscopy;  Laterality: N/A;   ESOPHAGOGASTRODUODENOSCOPY N/A 09/22/2017    Procedure: ESOPHAGOGASTRODUODENOSCOPY (EGD);  Surgeon: Vida Rigger, MD;  Location: [facility] ENDOSCOPY;  Service: Endoscopy;  Laterality:  N/A;  bedside   ESOPHAGOGASTRODUODENOSCOPY N/A 03/02/2020    Procedure: ESOPHAGOGASTRODUODENOSCOPY (EGD);  Surgeon: Bernette Redbird, MD;  Location: [facility];  Service: Endoscopy;  Laterality: N/A;   ESOPHAGOGASTRODUODENOSCOPY N/A 11/03/2020    Procedure: ESOPHAGOGASTRODUODENOSCOPY (EGD);  Surgeon: Charlott Rakes, MD;  Location: [facility] ENDOSCOPY;  Service: Endoscopy;  Laterality: N/A;   ESOPHAGOGASTRODUODENOSCOPY N/A 04/12/2022    Procedure: ESOPHAGOGASTRODUODENOSCOPY (EGD);  Surgeon: Vida Rigger, MD;  Location: [facility] ENDOSCOPY;  Service: Gastroenterology;  Laterality: N/A;   ESOPHAGOGASTRODUODENOSCOPY N/A 05/27/2022    Procedure: ESOPHAGOGASTRODUODENOSCOPY (EGD);  Surgeon: Willis Modena, MD;  Location: [facility] ENDOSCOPY;  Service: Gastroenterology;  Laterality: N/A;   ESOPHAGOGASTRODUODENOSCOPY N/A 09/27/2022    Procedure: ESOPHAGOGASTRODUODENOSCOPY (EGD);  Surgeon: Lynann Bologna, DO;  Location: [facility] ENDOSCOPY;  Service: Gastroenterology;  Laterality: N/A;   ESOPHAGOGASTRODUODENOSCOPY (EGD) WITH PROPOFOL N/A 04/27/2014    Procedure: ESOPHAGOGASTRODUODENOSCOPY (EGD) WITH PROPOFOL;  Surgeon: Florencia Reasons, MD;  Location: [facility] ENDOSCOPY;  Service: Endoscopy;  Laterality: N/A;  possible apc   ESOPHAGOGASTRODUODENOSCOPY (EGD) WITH PROPOFOL N/A 09/30/2017    Procedure: ESOPHAGOGASTRODUODENOSCOPY (EGD) WITH PROPOFOL;  Surgeon: Kerin Salen, MD;  Location: [facility] ENDOSCOPY;  Service: Gastroenterology;  Laterality: N/A;   ESOPHAGOGASTRODUODENOSCOPY (EGD) WITH PROPOFOL N/A 10/01/2017    Procedure: ESOPHAGOGASTRODUODENOSCOPY (EGD) WITH PROPOFOL;  Surgeon: Kerin Salen, MD;  Location: [facility] ENDOSCOPY;  Service: Gastroenterology;  Laterality: N/A;   ESOPHAGOGASTRODUODENOSCOPY (EGD) WITH PROPOFOL N/A 10/08/2017    Procedure: ESOPHAGOGASTRODUODENOSCOPY (EGD) WITH PROPOFOL;  Surgeon: Kathi Der, MD;  Location: [facility] ENDOSCOPY;  Service: Gastroenterology;  Laterality: N/A;   ESOPHAGOGASTRODUODENOSCOPY (EGD) WITH PROPOFOL N/A  10/17/2017    Procedure: ESOPHAGOGASTRODUODENOSCOPY (EGD) WITH PROPOFOL;  Surgeon: Kathi Der, MD;  Location: [facility] ENDOSCOPY;  Service: Gastroenterology;  Laterality: N/A;   ESOPHAGOGASTRODUODENOSCOPY (EGD) WITH PROPOFOL N/A 10/19/2017    Procedure: ESOPHAGOGASTRODUODENOSCOPY (EGD) WITH PROPOFOL;  Surgeon: Kathi Der, MD;  Location: [facility] ENDOSCOPY;  Service: Gastroenterology;  Laterality: N/A;   ESOPHAGOGASTRODUODENOSCOPY (EGD) WITH PROPOFOL N/A 12/04/2018    Procedure: ESOPHAGOGASTRODUODENOSCOPY (EGD) WITH PROPOFOL;  Surgeon: Charlott Rakes, MD;  Location: [facility] ENDOSCOPY;  Service: Endoscopy;  Laterality: N/A;   GIVENS CAPSULE STUDY N/A 10/02/2017    Procedure: GIVENS CAPSULE STUDY;  Surgeon: Kerin Salen, MD;  Location: [facility] ENDOSCOPY;  Service: Gastroenterology;  Laterality: N/A;   GIVENS CAPSULE STUDY N/A 10/08/2017    Procedure: GIVENS CAPSULE STUDY;  Surgeon: Kathi Der, MD;  Location: [facility] ENDOSCOPY;  Service: Gastroenterology;  Laterality: N/A;  endoscopic placement of capsule   GIVENS CAPSULE STUDY N/A 03/02/2020    Procedure: GIVENS CAPSULE STUDY;  Surgeon: Bernette Redbird, MD;  Location: [facility] ENDOSCOPY;  Service: Endoscopy;  Laterality: N/A;   HEMOSTASIS CLIP PLACEMENT   12/04/2018    Procedure: HEMOSTASIS CLIP PLACEMENT;  Surgeon: Charlott Rakes, MD;  Location: [facility] ENDOSCOPY;  Service: Endoscopy;;   HEMOSTASIS CLIP PLACEMENT   05/27/2022    Procedure: HEMOSTASIS CLIP PLACEMENT;  Surgeon: Willis Modena, MD;  Location: [facility] ENDOSCOPY;  Service: Gastroenterology;;   HEMOSTASIS CLIP PLACEMENT   09/27/2022  Procedure: HEMOSTASIS CLIP PLACEMENT;  Surgeon: Lynann Bologna, DO;  Location: [facility] ENDOSCOPY;  Service: Gastroenterology;;   HEMOSTASIS CONTROL   05/27/2022    Procedure: HEMOSTASIS CONTROL;  Surgeon: Willis Modena, MD;  Location: [facility] ENDOSCOPY;  Service: Gastroenterology;;   HOT HEMOSTASIS N/A 04/27/2014    Procedure: HOT HEMOSTASIS (ARGON PLASMA COAGULATION/BICAP);  Surgeon:  Florencia Reasons, MD;  Location: [facility] ENDOSCOPY;  Service: Endoscopy;  Laterality: N/A;   HOT HEMOSTASIS N/A 09/30/2017    Procedure: HOT HEMOSTASIS (ARGON PLASMA COAGULATION/BICAP);  Surgeon: Kerin Salen, MD;  Location: [facility] ENDOSCOPY;  Service: Gastroenterology;  Laterality: N/A;   HOT HEMOSTASIS N/A 10/01/2017    Procedure: HOT HEMOSTASIS (ARGON PLASMA COAGULATION/BICAP);  Surgeon: Kerin Salen, MD;  Location: [facility] ENDOSCOPY;  Service: Gastroenterology;  Laterality: N/A;   HOT HEMOSTASIS N/A 10/17/2017    Procedure: HOT HEMOSTASIS (ARGON PLASMA COAGULATION/BICAP);  Surgeon: Kathi Der, MD;  Location: [facility] ENDOSCOPY;  Service: Gastroenterology;  Laterality: N/A;   HOT HEMOSTASIS N/A 10/19/2017    Procedure: HOT HEMOSTASIS (ARGON PLASMA COAGULATION/BICAP);  Surgeon: Kathi Der, MD;  Location: [facility] ENDOSCOPY;  Service: Gastroenterology;  Laterality: N/A;   HOT HEMOSTASIS N/A 03/02/2020    Procedure: HOT HEMOSTASIS (ARGON PLASMA COAGULATION/BICAP);  Surgeon: Bernette Redbird, MD;  Location: [facility] ENDOSCOPY;  Service: Endoscopy;  Laterality: N/A;   HOT HEMOSTASIS N/A 04/12/2022    Procedure: HOT HEMOSTASIS (ARGON PLASMA COAGULATION/BICAP);  Surgeon: Vida Rigger, MD;  Location: [facility] ENDOSCOPY;  Service: Gastroenterology;  Laterality: N/A;   IR IMAGING GUIDED PORT INSERTION   07/08/2018   L shoulder Surgery   2011   ORIF ANKLE FRACTURE Right 04/24/2023    Procedure: OPEN REDUCTION INTERNAL FIXATION (ORIF) ANKLE FRACTURE;  Surgeon: Luci Bank, MD;  Location: [facility] OR;  Service: Orthopedics;  Laterality: Right;   POLYPECTOMY   03/02/2020    Procedure: POLYPECTOMY;  Surgeon: Bernette Redbird, MD;  Location: [facility] ENDOSCOPY;  Service: Endoscopy;;   SCLEROTHERAPY   11/03/2020    Procedure: Susa Day;  Surgeon: Charlott Rakes, MD;  Location: [facility] ENDOSCOPY;  Service: Endoscopy;;   SUBMUCOSAL INJECTION   09/22/2017    Procedure: SUBMUCOSAL INJECTION;  Surgeon: Vida Rigger, MD;  Location: [facility] ENDOSCOPY;  Service:  Endoscopy;;   SUBMUCOSAL INJECTION   12/04/2018    Procedure: SUBMUCOSAL INJECTION;  Surgeon: Charlott Rakes, MD;  Location: [facility] ENDOSCOPY;  Service: Endoscopy;;             Family History  Problem Relation Age of Onset   Cancer Mother          Ovarian   Ovarian cancer Mother     Diabetes Mother     Kidney disease Mother     Bleeding Disorder Mother     Diabetes type II Sister     Bleeding Disorder Sister     Diabetes Sister     Colon cancer Maternal Grandfather     Crohn's disease Maternal Grandfather     Stomach cancer Maternal Grandmother     Kidney disease Son     Bleeding Disorder Son     Dysmenorrhea Neg Hx          Social History:  reports that she has been smoking cigarettes. She has a 15 pack-year smoking history. She quit smokeless tobacco use about 44 years ago.  Her smokeless tobacco use included snuff. She reports that she does not drink alcohol and does not use drugs. Allergies:  Allergies       Allergies  Allergen Reactions   Feraheme [Ferumoxytol]  Other (See Comments)      Muscle tetany, encephalopathy, fatigue, nausea (reaction to injection)   Nsaids Other (See Comments)      Stomach bleeding episodes; Tylenol is OK   Tomato Hives   Iron (Ferrous Sulfate) [Ferrous Sulfate Er] Other (See Comments)      Shock   Wasp Venom Swelling            Medications Prior to Admission  Medication Sig Dispense Refill   Accu-Chek Softclix Lancets lancets Use to check blood sugar before breakfast and before dinner while on steroids (Patient taking differently: 1 each by Other route See admin instructions. Use to check blood sugar before breakfast and before dinner while on steroids) 100 each 1   ALPRAZolam (XANAX) 0.5 MG tablet TAKE 1 TABLET BY MOUTH AT BEDTIME AS NEEDED FOR ANXIETY OR SLEEP 7 tablet 0   aminocaproic acid (AMICAR) 500 MG tablet Take 2 tablets (1,000 mg total) by mouth every 12 (twelve) hours. 120 tablet 3   amLODipine (NORVASC) 10 MG tablet TAKE 1  TABLET(10 MG) BY MOUTH DAILY (Patient taking differently: Take 10 mg by mouth daily.) 90 tablet 3   citalopram (CELEXA) 20 MG tablet Take 1 tablet (20 mg total) by mouth daily. 90 tablet 3   glucose blood (ACCU-CHEK GUIDE) test strip Check blood sugar 2 times per day while on steroids before breakfast and dinner (Patient taking differently: 1 each by Other route See admin instructions. Check blood sugar 2 times per day while on steroids before breakfast and dinner) 50 each 3   lisinopril (ZESTRIL) 20 MG tablet Take 20 mg by mouth daily.       metFORMIN (GLUCOPHAGE) 500 MG tablet TAKE 1 TABLET(500 MG) BY MOUTH TWICE DAILY WITH A MEAL 180 tablet 3   mirtazapine (REMERON) 7.5 MG tablet Take 1 tablet (7.5 mg total) by mouth at bedtime. 30 tablet 1   nicotine (NICODERM CQ - DOSED IN MG/24 HOURS) 14 mg/24hr patch Place 1 patch (14 mg total) onto the skin daily. (Patient taking differently: Place 14 mg onto the skin daily.) 30 patch 3   ondansetron (ZOFRAN) 4 MG tablet Take 1 tablet (4 mg total) by mouth every 8 (eight) hours as needed for nausea or vomiting. 30 tablet 0   pantoprazole (PROTONIX) 40 MG tablet TAKE 1 TABLET(40 MG) BY MOUTH TWICE DAILY (Patient taking differently: Take 40 mg by mouth daily.) 180 tablet 1   PROAIR HFA 108 (90 Base) MCG/ACT inhaler INHALE 1 TO 2 PUFFS INTO THE LUNGS EVERY 6 HOURS AS NEEDED FOR WHEEZING OR SHORTNESS OF BREATH (Patient taking differently: Inhale 1-2 puffs into the lungs every 6 (six) hours as needed for wheezing or shortness of breath.) 8.5 g 0   traZODone (DESYREL) 100 MG tablet TAKE 1 TABLET BY MOUTH AT BEDTIME 30 tablet 2   TYLENOL PM EXTRA STRENGTH 500-25 MG TABS tablet Take 2 tablets by mouth at bedtime as needed (for sleep, knee pain).              Home: Home Living Family/patient expects to be discharged to:: Private residence Living Arrangements: Children (Son) Available Help at Discharge: Family, Available PRN/intermittently (son works but sister can  stay as long as needed) Type of Home: Apartment Home Access: Level entry Home Layout: One level Bathroom Shower/Tub: Sponge bathes at baseline, Tub/shower unit Bathroom Toilet: Standard (BSC on top) Bathroom Accessibility: Yes Home Equipment: Agricultural consultant (2 wheels), BSC/3in1 Additional Comments: Pt lives with son who works 3pm-3am  Functional History: Prior Function Prior Level of Function : Needs assist Mobility Comments: pt reported using RW "off and on" for a few months ADLs Comments: needed assist with dressing Functional Status:  Mobility: Bed Mobility Overal bed mobility: Needs Assistance Bed Mobility: Supine to Sit Rolling: Supervision Sidelying to sit: Used rails, Min assist, HOB elevated Supine to sit: Used rails, Min assist, HOB elevated Sit to supine: Min assist, HOB elevated, Used rails General bed mobility comments: Slow and effortful to rise. HOB slightly elevated, performed 75% for transition by herself with therapist allowing pt to pull through hand to scoot to EOB and squre herself at the end. Transfers Overall transfer level: Needs assistance Equipment used: Rolling walker (2 wheels), None Transfers: Sit to/from Stand, Bed to chair/wheelchair/BSC Sit to Stand: Mod assist, +2 physical assistance Bed to/from chair/wheelchair/BSC transfer type:: Lateral/scoot transfer  Lateral/Scoot Transfers: Min assist General transfer comment: Performed lateral scoot from bed to recliner with min assist to support RLE. Educated on techniques and progressed nicely with ability to use UEs to boost hips off bed and perform lateral scoots with some clearance. Requires extra time and is effortful but with some cues for technique and support with RLE pt performed successfully. Progressed with sit to stand using RW, cues for hand placement and technique, required Mod assist +2 from reclining chair. Tolerated approx 40 seconds  and took 2 small hops backwards, therapist supported RLE to  prevent WB. Ambulation/Gait General Gait Details: unable   ADL: ADL Overall ADL's : Needs assistance/impaired Eating/Feeding: Set up, Sitting Grooming: Wash/dry hands, Wash/dry face, Sitting, Set up Upper Body Bathing: Set up, Sitting Lower Body Bathing: Moderate assistance, Maximal assistance, Sitting/lateral leans Upper Body Dressing : Set up, Sitting Lower Body Dressing: Maximal assistance, Total assistance, Sitting/lateral leans Toilet Transfer: Minimal assistance, BSC/3in1 (incremental scoot transfer) Toileting- Clothing Manipulation and Hygiene: Moderate assistance, Sitting/lateral lean Toileting - Clothing Manipulation Details (indicate cue type and reason): for thoroughness Functional mobility during ADLs: Moderate assistance, Rolling walker (2 wheels) General ADL Comments: Patient seen as a re-eval this date due to surgery on R ankle (remains NWB). Patient able to complete incremental scoot to drop arm recliner at min A, and then completed sit<>stand with mod A of 2 in order to maintain NWB and to assist in transfer with RW. Patient is mod A for ADL management. OT continues to recommend rehab stint < 3 hours due to current level, and need for assist for all aspects of care. OT will continue to follow.   Cognition: Cognition Overall Cognitive Status: Within Functional Limits for tasks assessed Orientation Level: Oriented X4 Cognition Arousal: Alert Behavior During Therapy: WFL for tasks assessed/performed Overall Cognitive Status: Within Functional Limits for tasks assessed   Blood pressure (!) 115/51, pulse (!) 53, temperature 98.4 F (36.9 C), resp. rate 18, height 5' 4.5" (1.638 m), weight 102.5 kg, SpO2 100%. Physical Exam Constitutional:      General: She is not in acute distress.    Appearance: She is obese.  HENT:     Head: Normocephalic and atraumatic.     Right Ear: External ear normal.     Left Ear: External ear normal.     Nose: Nose normal.     Mouth/Throat:      Mouth: Mucous membranes are moist.  Eyes:     Pupils: Pupils are equal, round, and reactive to light.  Cardiovascular:     Rate and Rhythm: Bradycardia present.  Pulmonary:     Effort: Pulmonary effort is  normal.  Abdominal:     General: There is no distension.     Palpations: Abdomen is soft.  Musculoskeletal:        General: Swelling present. Normal range of motion.     Cervical back: Normal range of motion.     Comments: Right leg in splint  Skin:    General: Skin is warm.     Coloration: Skin is not jaundiced.     Comments: Skin scaly, dry in LE's  Neurological:     Mental Status: She is alert.     Comments: Alert and oriented x 3. Normal insight and awareness. Intact Memory. Normal language and speech. Cranial nerve exam unremarkable. MMT: UE 4/5 prox to 4+/5 distal. RLE- 2/5 (pain), able to wiggle toes. LLE 3/5 HF, KE and 4+/5 ADF/PF.  Sensory exam normal for light touch and pain in all 4 limbs. No limb ataxia or cerebellar signs. No abnormal tone appreciated.    Psychiatric:        Mood and Affect: Mood normal.        Behavior: Behavior normal.        Lab Results Last 24 Hours       Results for orders placed or performed during the hospital encounter of 04/13/23 (from the past 24 hours)  Glucose, capillary     Status: Abnormal    Collection Time: 04/28/23 12:12 PM  Result Value Ref Range    Glucose-Capillary 137 (H) 70 - 99 mg/dL  CBC     Status: Abnormal    Collection Time: 04/29/23  2:27 AM  Result Value Ref Range    WBC 10.5 4.0 - 10.5 K/uL    RBC 2.61 (L) 3.87 - 5.11 MIL/uL    Hemoglobin 7.0 (L) 12.0 - 15.0 g/dL    HCT 16.1 (L) 09.6 - 46.0 %    MCV 88.1 80.0 - 100.0 fL    MCH 26.8 26.0 - 34.0 pg    MCHC 30.4 30.0 - 36.0 g/dL    RDW 04.5 (H) 40.9 - 15.5 %    Platelets 469 (H) 150 - 400 K/uL    nRBC 0.0 0.0 - 0.2 %  Basic metabolic panel     Status: Abnormal    Collection Time: 04/29/23  2:27 AM  Result Value Ref Range    Sodium 141 135 - 145 mmol/L     Potassium 4.2 3.5 - 5.1 mmol/L    Chloride 109 98 - 111 mmol/L    CO2 26 22 - 32 mmol/L    Glucose, Bld 100 (H) 70 - 99 mg/dL    BUN 28 (H) 8 - 23 mg/dL    Creatinine, Ser 8.11 (H) 0.44 - 1.00 mg/dL    Calcium 8.4 (L) 8.9 - 10.3 mg/dL    GFR, Estimated 58 (L) >60 mL/min    Anion gap 6 5 - 15  Glucose, capillary     Status: None    Collection Time: 04/29/23  8:01 AM  Result Value Ref Range    Glucose-Capillary 98 70 - 99 mg/dL    *Note: Due to a large number of results and/or encounters for the requested time period, some results have not been displayed. A complete set of results can be found in Results Review.      Imaging Results (Last 48 hours)  No results found.     Assessment/Plan: Diagnosis: 63 year old female with syncopal episode causing a fall and right trimalleolar ankle fracture.  She is nonweightbearing right lower extremity  Does the need for close, 24 hr/day medical supervision in concert with the patient's rehab needs make it unreasonable for this patient to be served in a less intensive setting? 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 Yes Does the patient require coordinated care of a physician, rehab nurse, therapy disciplines of PT, OT to address physical and functional deficits in the context of the above medical diagnosis(es)? Yes Addressing deficits in the following areas: balance, endurance, locomotion, strength, transferring, bowel/bladder control, bathing, dressing, feeding, grooming, toileting, and psychosocial support Can the patient actively participate in an intensive therapy program of at least 3 hrs of therapy per day at  least 5 days per week? Yes The potential for patient to make measurable gains while on inpatient rehab is excellent Anticipated functional outcomes upon discharge from inpatient rehab are modified independent and supervision  with PT, modified independent and supervision with OT, n/a with SLP. Estimated rehab length of stay to reach the above functional goals is: 13-17 days Anticipated discharge destination: Home Overall Rehab/Functional Prognosis: excellent   POST ACUTE RECOMMENDATIONS: This patient's condition is appropriate for continued rehabilitative care in the following setting: CIR Patient has agreed to participate in recommended program. Yes Note that insurance prior authorization may be required for reimbursement for recommended care.   Comment: Pt has sister and friends who can provide some help aside from her son who works from 3p-3a daily. She is motivated to regain household independence. Rehab Admissions Coordinator to follow up.     I have personally performed a face to face diagnostic evaluation of this patient. Additionally, I have examined the patient's medical record including any pertinent labs and radiographic images. If the physician assistant has documented in this note, I have reviewed and edited or otherwise concur with the physician assistant's documentation.   Thanks,   [name], MD 04/29/2023          Routing History

## 2023-05-02 NOTE — Discharge Instructions (Addendum)
Inpatient Rehab Discharge Instructions  HESTER JOSLIN Discharge date and time: 05/19/2023  Activities/Precautions/ Functional Status: Activity: no lifting, driving, or strenuous exercise until cleared by MD Diet: regular diet Wound Care: none needed Functional status:  ___ No restrictions     ___ Walk up steps independently ___ 24/7 supervision/assistance   ___ Walk up steps with assistance _x__ Intermittent supervision/assistance  ___ Bathe/dress independently ___ Walk with walker     ___ Bathe/dress with assistance ___ Walk Independently    ___ Shower independently ___ Walk with assistance    __x_ Shower with assistance __x_ No alcohol     ___ Return to work/school ________  Special Instructions: No driving, alcohol consumption or tobacco use.  Recommend daily BP measurement in same arm and record time of day. Bring this information with you to follow-up appointment with PCP.   COMMUNITY REFERRALS UPON DISCHARGE:    Home Health:   PT      OT      ST      SNA                Agency: [facility]    Phone: [phone]  *Please expect follow-up within 2-3 business days for discharge to schedule your home visit. If you have not received follow-up, be sure to contact the site directly.*    Medical Equipment/Items Ordered:wheelchair and tub transfer bench                                                 Agency/Supplier: [employer] [phone]  GENERAL COMMUNITY RESOURCES FOR PATIENT/FAMILY:  1) A referral was placed on your behalf to your insurance with the LTSS/Case management Department for PCS (personal care services). Be sure to contact [phone] to check status of referral.   2) If you require transportation to medical appointments, be sure to call [phone] atleast 3-5 days in advance to schedule your appointment.     My questions have been answered and I understand these instructions. I will adhere to these goals and the provided  educational materials after my discharge from the hospital.  Patient/Caregiver Signature _______________________________ Date __________  Clinician Signature _______________________________________ Date __________  Please bring this form and your medication list with you to all your follow-up doctor's appointments.

## 2023-05-02 NOTE — H&P (Signed)
Physical Medicine and Rehabilitation Admission H&P     CC: Functional deficits secondary to debility   HPI: Peggy House is a 63 year old female with a history of HTT presented to the ED on 04/13/2023 reporting syncopal episode and fall resulting in right ankle fracture.  She exhibited profound anemia on lab work with a hemoglobin 5.1. She underwent ORIF by Dr. Hulda Humphrey on 1/09 and she is NWB for 6-8 weeks. She continued to have nose bleeds, per primary team, the risk of chemoppx for DVT outweighs the benefits. Xarelto discontinued and SCDs placed for DVT ppx. Transfused additional unit of pRBC for a total of 10 units since admission with one unit of FFP. Follows with Dr. Malachy Mood. Cardiac workup for syncope, echo showed grade 2 diastolic dysfunction.  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 s/p IR embolization of gastric artery branch vessels on 12/10/17, cauterization of stomach for severe GI bleed in 11/2018, and epinephrine injection for bleeding ulcer on 11/03/20. She has required frequent blood transfusions, IV iron and bevacizumab. Has indwelling right internal jugular venous access device. PMH significant for DM-2, hypertension, depression. Pt able to stand pivot to [facility] with min A for RLE NWB and balance and dense cues for technique to pivot. Pt demonstrates difficulty increasing BUE support to pivot on LLE with tendency to hop on LLE, but is able to improve with cues and practice. The patient requires inpatient medicine and rehabilitation evaluations and services for ongoing dysfunction secondary to debility.       Review of Systems  Constitutional:  Positive for malaise/fatigue. Negative for chills and fever.  HENT:  Negative for hearing loss.   Eyes:  Negative for blurred vision and double vision.  Respiratory:  Negative for cough.   Cardiovascular:  Negative for chest pain.  Gastrointestinal:  Positive for constipation and nausea.  Genitourinary:   Negative for dysuria.  Musculoskeletal:  Positive for falls and joint pain. Negative for myalgias.  Neurological:  Positive for dizziness and weakness.  Endo/Heme/Allergies:  Bruises/bleeds easily.  Psychiatric/Behavioral:  Negative for depression. The patient is nervous/anxious.         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             Past Surgical History:  Procedure Laterality Date   ABDOMINAL HYSTERECTOMY       CARPAL TUNNEL RELEASE   05/13/2011    Procedure: CARPAL TUNNEL RELEASE;  Surgeon: Mable Paris, MD;  Location:  SURGERY CENTER;  Service: Orthopedics;  Laterality: Left;   COLONOSCOPY N/A 03/02/2020    Procedure: COLONOSCOPY;  Surgeon: Bernette Redbird, MD;  Location: [facility] END[facility];  Service: Endoscopy;  Laterality: N/A;   COLONOSCOPY WITH PROPOFOL N/A 04/28/2014    Procedure: COLONOSCOPY WITH PROPOFOL;  Surgeon: Florencia Reasons, MD;  Location: [facility] ENDOSCOPY;  Service: Endoscopy;  Laterality: N/A;   DG TOES*L*   2/10    rt   DILATION AND CURETTAGE OF UTERUS       ENTEROSCOPY N/A 10/17/2017  Procedure: ENTEROSCOPY;  Surgeon: Kathi Der, MD;  Location: [facility];  Service: Gastroenterology;  Laterality: N/A;   ESOPHAGOGASTRODUODENOSCOPY N/A 04/10/2014    Procedure: ESOPHAGOGASTRODUODENOSCOPY (EGD);  Surgeon: Shirley Friar, MD;  Location: [facility] ENDOSCOPY;  Service: Endoscopy;  Laterality: N/A;   ESOPHAGOGASTRODUODENOSCOPY N/A 05/10/2017    Procedure: ESOPHAGOGASTRODUODENOSCOPY (EGD);  Surgeon: Bernette Redbird, MD;  Location: [facility] ENDOSCOPY;  Service: Endoscopy;  Laterality: N/A;    ESOPHAGOGASTRODUODENOSCOPY N/A 09/22/2017    Procedure: ESOPHAGOGASTRODUODENOSCOPY (EGD);  Surgeon: Vida Rigger, MD;  Location: [facility] ENDOSCOPY;  Service: Endoscopy;  Laterality: N/A;  bedside   ESOPHAGOGASTRODUODENOSCOPY N/A 03/02/2020    Procedure: ESOPHAGOGASTRODUODENOSCOPY (EGD);  Surgeon: Bernette Redbird, MD;  Location: [facility];  Service: Endoscopy;  Laterality: N/A;   ESOPHAGOGASTRODUODENOSCOPY N/A 11/03/2020    Procedure: ESOPHAGOGASTRODUODENOSCOPY (EGD);  Surgeon: Charlott Rakes, MD;  Location: [facility] ENDOSCOPY;  Service: Endoscopy;  Laterality: N/A;   ESOPHAGOGASTRODUODENOSCOPY N/A 04/12/2022    Procedure: ESOPHAGOGASTRODUODENOSCOPY (EGD);  Surgeon: Vida Rigger, MD;  Location: [facility] ENDOSCOPY;  Service: Gastroenterology;  Laterality: N/A;   ESOPHAGOGASTRODUODENOSCOPY N/A 05/27/2022    Procedure: ESOPHAGOGASTRODUODENOSCOPY (EGD);  Surgeon: Willis Modena, MD;  Location: [facility] ENDOSCOPY;  Service: Gastroenterology;  Laterality: N/A;   ESOPHAGOGASTRODUODENOSCOPY N/A 09/27/2022    Procedure: ESOPHAGOGASTRODUODENOSCOPY (EGD);  Surgeon: Lynann Bologna, DO;  Location: [facility] ENDOSCOPY;  Service: Gastroenterology;  Laterality: N/A;   ESOPHAGOGASTRODUODENOSCOPY (EGD) WITH PROPOFOL N/A 04/27/2014    Procedure: ESOPHAGOGASTRODUODENOSCOPY (EGD) WITH PROPOFOL;  Surgeon: Florencia Reasons, MD;  Location: [facility] ENDOSCOPY;  Service: Endoscopy;  Laterality: N/A;  possible apc   ESOPHAGOGASTRODUODENOSCOPY (EGD) WITH PROPOFOL N/A 09/30/2017    Procedure: ESOPHAGOGASTRODUODENOSCOPY (EGD) WITH PROPOFOL;  Surgeon: Kerin Salen, MD;  Location: [facility] ENDOSCOPY;  Service: Gastroenterology;  Laterality: N/A;   ESOPHAGOGASTRODUODENOSCOPY (EGD) WITH PROPOFOL N/A 10/01/2017    Procedure: ESOPHAGOGASTRODUODENOSCOPY (EGD) WITH PROPOFOL;  Surgeon: Kerin Salen, MD;  Location: [facility] ENDOSCOPY;  Service: Gastroenterology;  Laterality: N/A;   ESOPHAGOGASTRODUODENOSCOPY (EGD) WITH PROPOFOL N/A 10/08/2017    Procedure: ESOPHAGOGASTRODUODENOSCOPY  (EGD) WITH PROPOFOL;  Surgeon: Kathi Der, MD;  [facility];  Service: Gastroenterology;  Laterality: N/A;   ESOPHAGOGASTRODUODENOSCOPY (EGD) WITH PROPOFOL N/A 10/17/2017    Procedure: ESOPHAGOGASTRODUODENOSCOPY (EGD) WITH PROPOFOL;  Surgeon: Kathi Der, MD;  [facility];  Service: Gastroenterology;  Laterality: N/A;   ESOPHAGOGASTRODUODENOSCOPY (EGD) WITH PROPOFOL N/A 10/19/2017    Procedure: ESOPHAGOGASTRODUODENOSCOPY (EGD) WITH PROPOFOL;  Surgeon: Kathi Der, MD;  [facility];  Service: Gastroenterology;  Laterality: N/A;   ESOPHAGOGASTRODUODENOSCOPY (EGD) WITH PROPOFOL N/A 12/04/2018    Procedure: ESOPHAGOGASTRODUODENOSCOPY (EGD) WITH PROPOFOL;  Surgeon: Charlott Rakes, MD;  [facility];  Service: Endoscopy;  Laterality: N/A;   GIVENS CAPSULE STUDY N/A 10/02/2017    Procedure: GIVENS CAPSULE STUDY;  Surgeon: Kerin Salen, MD;  Location: [facility] ENDOSCOPY;  Service: Gastroenterology;  Laterality: N/A;   GIVENS CAPSULE STUDY N/A 10/08/2017    Procedure: GIVENS CAPSULE STUDY;  Surgeon: Kathi Der, MD;  [facility];  Service: Gastroenterology;  Laterality: N/A;  endoscopic placement of capsule   GIVENS CAPSULE STUDY N/A 03/02/2020    Procedure: GIVENS CAPSULE STUDY;  Surgeon: Bernette Redbird, MD;  [facility];  Service: Endoscopy;  Laterality: N/A;   HEMOSTASIS CLIP PLACEMENT   12/04/2018    Procedure: HEMOSTASIS CLIP PLACEMENT;  Surgeon: Charlott Rakes, MD;  [facility];  Service: Endoscopy;;   HEMOSTASIS CLIP PLACEMENT   05/27/2022    Procedure: HEMOSTASIS CLIP PLACEMENT;  Surgeon: Willis Modena, MD;  [facility];  Service: Gastroenterology;;  HEMOSTASIS CLIP PLACEMENT   09/27/2022    Procedure: HEMOSTASIS CLIP PLACEMENT;  Surgeon: Lynann Bologna, DO;  [facility];  Service: Gastroenterology;;   HEMOSTASIS CONTROL   05/27/2022    Procedure: HEMOSTASIS CONTROL;  Surgeon:  Willis Modena, MD;  [facility];  Service: Gastroenterology;;   HOT HEMOSTASIS N/A 04/27/2014    Procedure: HOT HEMOSTASIS (ARGON PLASMA COAGULATION/BICAP);  Surgeon: Florencia Reasons, MD;  Location: [facility] ENDOSCOPY;  Service: Endoscopy;  Laterality: N/A;   HOT HEMOSTASIS N/A 09/30/2017    Procedure: HOT HEMOSTASIS (ARGON PLASMA COAGULATION/BICAP);  Surgeon: Kerin Salen, MD;  Location: [facility] ENDOSCOPY;  Service: Gastroenterology;  Laterality: N/A;   HOT HEMOSTASIS N/A 10/01/2017    Procedure: HOT HEMOSTASIS (ARGON PLASMA COAGULATION/BICAP);  Surgeon: Kerin Salen, MD;  Location: [facility] ENDOSCOPY;  Service: Gastroenterology;  Laterality: N/A;   HOT HEMOSTASIS N/A 10/17/2017    Procedure: HOT HEMOSTASIS (ARGON PLASMA COAGULATION/BICAP);  Surgeon: Kathi Der, MD;  Location: [facility] ENDOSCOPY;  Service: Gastroenterology;  Laterality: N/A;   HOT HEMOSTASIS N/A 10/19/2017    Procedure: HOT HEMOSTASIS (ARGON PLASMA COAGULATION/BICAP);  Surgeon: Kathi Der, MD;  Location: [facility] ENDOSCOPY;  Service: Gastroenterology;  Laterality: N/A;   HOT HEMOSTASIS N/A 03/02/2020    Procedure: HOT HEMOSTASIS (ARGON PLASMA COAGULATION/BICAP);  Surgeon: Bernette Redbird, MD;  Location: [facility] ENDOSCOPY;  Service: Endoscopy;  Laterality: N/A;   HOT HEMOSTASIS N/A 04/12/2022    Procedure: HOT HEMOSTASIS (ARGON PLASMA COAGULATION/BICAP);  Surgeon: Vida Rigger, MD;  Location: [facility] ENDOSCOPY;  Service: Gastroenterology;  Laterality: N/A;   IR IMAGING GUIDED PORT INSERTION   07/08/2018   L shoulder Surgery   2011   ORIF ANKLE FRACTURE Right 04/24/2023    Procedure: OPEN REDUCTION INTERNAL FIXATION (ORIF) ANKLE FRACTURE;  Surgeon: Luci Bank, MD;  [facility] OR;  Service: Orthopedics;  Laterality: Right;   POLYPECTOMY   03/02/2020    Procedure: POLYPECTOMY;  Surgeon: Bernette Redbird, MD;  Location[facility] ENDOS[facility];  Service: Endoscopy;;   SCLEROTHERAPY   11/03/2020    Procedure: Susa Day;  Surgeon: Charlott Rakes, MD;   Location: [facility] ENDOSCOPY;  Service: Endoscopy;;   SUBMUCOSAL INJECTION   09/22/2017    Procedure: SUBMUCOSAL INJECTION;  Surgeon: Vida Rigger, MD;  Location: [facility] ENDOSCOPY;  Service: Endoscopy;;   SUBMUCOSAL INJECTION   12/04/2018    Procedure: SUBMUCOSAL INJECTION;  Surgeon: Charlott Rakes, MD;  Location: [facility] ENDOSCOPY;  Service: Endoscopy;;             Family History  Problem Relation Age of Onset   Cancer Mother          Ovarian   Ovarian cancer Mother     Diabetes Mother     Kidney disease Mother     Bleeding Disorder Mother     Diabetes type II Sister     Bleeding Disorder Sister     Diabetes Sister     Colon cancer Maternal Grandfather     Crohn's disease Maternal Grandfather     Stomach cancer Maternal Grandmother     Kidney disease Son     Bleeding Disorder Son     Dysmenorrhea Neg Hx          Social History:  reports that she has been smoking cigarettes. She has a 15 pack-year smoking history. She quit smokeless tobacco use about 44 years ago.  Her smokeless tobacco use included snuff. She reports that she does not drink alcohol and does not use drugs. Allergies:  Allergies  Allergies  Allergen Reactions   Feraheme [Ferumoxytol] Other (See Comments)      Muscle tetany, encephalopathy, fatigue, nausea (reaction to injection)   Nsaids Other (See Comments)      Stomach bleeding episodes; Tylenol is OK   Tomato Hives   Iron (Ferrous Sulfate) [Ferrous Sulfate Er] Other (See Comments)      Shock   Wasp Venom Swelling            Medications Prior to Admission  Medication Sig Dispense Refill   Accu-Chek Softclix Lancets lancets Use to check blood sugar before breakfast and before dinner while on steroids (Patient taking differently: 1 each by Other route See admin instructions. Use to check blood sugar before breakfast and before dinner while on steroids) 100 each 1   ALPRAZolam (XANAX) 0.5 MG tablet TAKE 1 TABLET BY MOUTH AT BEDTIME AS NEEDED FOR ANXIETY OR SLEEP  7 tablet 0   aminocaproic acid (AMICAR) 500 MG tablet Take 2 tablets (1,000 mg total) by mouth every 12 (twelve) hours. 120 tablet 3   amLODipine (NORVASC) 10 MG tablet TAKE 1 TABLET(10 MG) BY MOUTH DAILY (Patient taking differently: Take 10 mg by mouth daily.) 90 tablet 3   citalopram (CELEXA) 20 MG tablet Take 1 tablet (20 mg total) by mouth daily. 90 tablet 3   glucose blood (ACCU-CHEK GUIDE) test strip Check blood sugar 2 times per day while on steroids before breakfast and dinner (Patient taking differently: 1 each by Other route See admin instructions. Check blood sugar 2 times per day while on steroids before breakfast and dinner) 50 each 3   lisinopril (ZESTRIL) 20 MG tablet Take 20 mg by mouth daily.       metFORMIN (GLUCOPHAGE) 500 MG tablet TAKE 1 TABLET(500 MG) BY MOUTH TWICE DAILY WITH A MEAL 180 tablet 3   mirtazapine (REMERON) 7.5 MG tablet Take 1 tablet (7.5 mg total) by mouth at bedtime. 30 tablet 1   nicotine (NICODERM CQ - DOSED IN MG/24 HOURS) 14 mg/24hr patch Place 1 patch (14 mg total) onto the skin daily. (Patient taking differently: Place 14 mg onto the skin daily.) 30 patch 3   ondansetron (ZOFRAN) 4 MG tablet Take 1 tablet (4 mg total) by mouth every 8 (eight) hours as needed for nausea or vomiting. 30 tablet 0   pantoprazole (PROTONIX) 40 MG tablet TAKE 1 TABLET(40 MG) BY MOUTH TWICE DAILY (Patient taking differently: Take 40 mg by mouth daily.) 180 tablet 1   PROAIR HFA 108 (90 Base) MCG/ACT inhaler INHALE 1 TO 2 PUFFS INTO THE LUNGS EVERY 6 HOURS AS NEEDED FOR WHEEZING OR SHORTNESS OF BREATH (Patient taking differently: Inhale 1-2 puffs into the lungs every 6 (six) hours as needed for wheezing or shortness of breath.) 8.5 g 0   traZODone (DESYREL) 100 MG tablet TAKE 1 TABLET BY MOUTH AT BEDTIME 30 tablet 2   TYLENOL PM EXTRA STRENGTH 500-25 MG TABS tablet Take 2 tablets by mouth at bedtime as needed (for sleep, knee pain).                  Home: Home  Living Family/patient expects to be discharged to:: Private residence Living Arrangements: Children (Son) Available Help at Discharge: Family, Available PRN/intermittently (son works but sister can stay as long as needed) Type of Home: Apartment Home Access: Level entry Home Layout: One level Bathroom Shower/Tub: Sponge bathes at baseline, Tub/shower unit Bathroom Toilet: Standard (BSC on top) Bathroom Accessibility: Yes Home Equipment: Agricultural consultant (2  wheels), BSC/3in1 Additional Comments: Pt lives with son who works 3pm-3am   Functional History: Prior Function Prior Level of Function : Needs assist Mobility Comments: pt reported using RW "off and on" for a few months ADLs Comments: needed assist with dressing   Functional Status:  Mobility: Bed Mobility Overal bed mobility: Needs Assistance Bed Mobility: Supine to Sit Rolling: Supervision Sidelying to sit: Used rails, HOB elevated, Contact guard assist Supine to sit: Used rails, Min assist, HOB elevated Sit to supine: Min assist, HOB elevated, Used rails General bed mobility comments: Pt in recliner at beginning and end of session Transfers Overall transfer level: Needs assistance Equipment used: Rolling walker (2 wheels), None Transfers: Bed to chair/wheelchair/BSC, Sit to/from Stand Sit to Stand: +2 safety/equipment, Mod assist Bed to/from chair/wheelchair/BSC transfer type:: Lateral/scoot transfer, Stand pivot Stand pivot transfers: Min assist  Lateral/Scoot Transfers: Contact guard assist General transfer comment: Lateral scoot from recliner to bed with CGA for safety and cues for technique and RLE NWB. Increased time required due to fatigue. Cues for improved LLE support for power up to scoot due to pt relying heavily on momentum and only scooting minimally with pt able to improve. STS from EOB and BSC with cues for hand placement and up to mod A for power up. Cues for RLE NWB with pt able to maintain. Pt able to stand  pivot to [facility] with min A for RLE NWB and balance and dense cues for technique to pivot. Pt demonstrates difficulty increasing BUE support to pivot on LLE with tendency to hop on LLE, but is able to improve with cues and practice. Ambulation/Gait General Gait Details: unable   ADL: ADL Overall ADL's : Needs assistance/impaired Eating/Feeding: Set up, Sitting Grooming: Wash/dry hands, Wash/dry face, Oral care, Set up, Sitting Upper Body Bathing: Set up, Sitting Lower Body Bathing: Moderate assistance, Maximal assistance, Sitting/lateral leans Upper Body Dressing : Set up, Sitting Lower Body Dressing: Maximal assistance, Total assistance, Sitting/lateral leans Toilet Transfer: Minimal assistance, BSC/3in1 (incremental scoot transfer) Toilet Transfer Details (indicate cue type and reason): simulated with scoot transfer Toileting- Clothing Manipulation and Hygiene: Moderate assistance, Sitting/lateral lean Toileting - Clothing Manipulation Details (indicate cue type and reason): for thoroughness Functional mobility during ADLs: Moderate assistance, Rolling walker (2 wheels) General ADL Comments: Patient focusing on OOB activity and functional independence with ADLs, patient is close to being able to complete an incremental scoot transfer at mod I level, though continues to require light min A for RLE management and cues for hand placement and safety. Patient is a mod A - total A for lower body ADL management. Given patient's ability to progress, her previous level of independence, and her ability to have 24/7 support, OT recommending intensive therapy > 3 hours in order to return to prior level of function.   Cognition: Cognition Overall Cognitive Status: Within Functional Limits for tasks assessed Orientation Level: Oriented X4 Cognition Arousal: Alert Behavior During Therapy: WFL for tasks assessed/performed Overall Cognitive Status: Within Functional Limits for tasks assessed   Physical  Exam: Blood pressure (!) 147/68, pulse 60, temperature 98.3 F (36.8 C), temperature source Oral, resp. rate 18, height 5' 4.5" (1.638 m), weight 102.5 kg, SpO2 100%. Physical Exam Constitutional:      General: She is not in acute distress.    Appearance: She is obese. She is not ill-appearing.  HENT:     Head: Normocephalic and atraumatic.     Right Ear: External ear normal.     Left Ear: External  ear normal.     Nose: Nose normal. No congestion.  Eyes:     Extraocular Movements: Extraocular movements intact.     Pupils: Pupils are equal, round, and reactive to light.  Cardiovascular:     Rate and Rhythm: Normal rate and regular rhythm.     Heart sounds: No murmur heard.    No gallop.  Pulmonary:     Effort: Pulmonary effort is normal. No respiratory distress.     Breath sounds: No wheezing.  Abdominal:     General: Bowel sounds are normal. There is no distension.     Palpations: Abdomen is soft.     Tenderness: There is no abdominal tenderness.  Musculoskeletal:        General: Tenderness (RLE) present. No swelling.     Cervical back: Normal range of motion.     Right lower leg: Edema present.     Left lower leg: No edema.  Skin:    General: Skin is warm and dry.  Neurological:     Mental Status: She is alert.     Comments: Alert and oriented x 3. Normal insight and awareness. Intact Memory. Normal language and speech. Cranial nerve exam unremarkable. MMT: BUE 4/5 prox to 5/5 distally. RLE limited by pain/splint, has at least 2/5 at hip, knee and can wiggle all toes. LLE 3+HF, KE and 5/5 ADF/PF. Sensory exam normal for light touch and pain in all 4 limbs. No limb ataxia or cerebellar signs. No abnormal tone appreciated.  [name]    Psychiatric:        Mood and Affect: Mood normal.        Behavior: Behavior normal.        Lab Results Last 48 Hours        Results for orders placed or performed during the hospital encounter of 04/13/23 (from the past 48 hours)  Glucose,  capillary     Status: Abnormal    Collection Time: 04/30/23 11:41 AM  Result Value Ref Range    Glucose-Capillary 131 (H) 70 - 99 mg/dL      Comment: Glucose reference range applies only to samples taken after fasting for at least 8 hours.  Glucose, capillary     Status: Abnormal    Collection Time: 04/30/23  3:13 PM  Result Value Ref Range    Glucose-Capillary 126 (H) 70 - 99 mg/dL      Comment: Glucose reference range applies only to samples taken after fasting for at least 8 hours.    Comment 1 Notify RN      Comment 2 Document in Chart    Glucose, capillary     Status: Abnormal    Collection Time: 04/30/23  9:48 PM  Result Value Ref Range    Glucose-Capillary 168 (H) 70 - 99 mg/dL      Comment: Glucose reference range applies only to samples taken after fasting for at least 8 hours.  CBC     Status: Abnormal    Collection Time: 05/01/23 12:19 AM  Result Value Ref Range    WBC 9.1 4.0 - 10.5 K/uL    RBC 2.80 (L) 3.87 - 5.11 MIL/uL    Hemoglobin 7.5 (L) 12.0 - 15.0 g/dL    HCT 91.4 (L) 78.2 - 46.0 %    MCV 91.1 80.0 - 100.0 fL    MCH 26.8 26.0 - 34.0 pg    MCHC 29.4 (L) 30.0 - 36.0 g/dL    RDW 95.6 (H) 21.3 - 15.5 %  Platelets 464 (H) 150 - 400 K/uL    nRBC 0.0 0.0 - 0.2 %      Comment: Performed at [facility], [address], [place], [place] [address]  Glucose, capillary     Status: Abnormal    Collection Time: 05/01/23  7:38 AM  Result Value Ref Range    Glucose-Capillary 114 (H) 70 - 99 mg/dL      Comment: Glucose reference range applies only to samples taken after fasting for at least 8 hours.  Glucose, capillary     Status: Abnormal    Collection Time: 05/01/23 11:51 AM  Result Value Ref Range    Glucose-Capillary 141 (H) 70 - 99 mg/dL      Comment: Glucose reference range applies only to samples taken after fasting for at least 8 hours.  Protime-INR     Status: Abnormal    Collection Time: 05/01/23 12:35 PM  Result Value Ref Range    Prothrombin Time  22.2 (H) 11.4 - 15.2 seconds    INR 1.9 (H) 0.8 - 1.2      Comment: (NOTE) INR goal varies based on device and disease states. Performed at [facility], [address], [place], [place] [address]    APTT     Status: Abnormal    Collection Time: 05/01/23 12:35 PM  Result Value Ref Range    aPTT 39 (H) 24 - 36 seconds      Comment:        IF BASELINE aPTT IS ELEVATED, SUGGEST PATIENT RISK ASSESSMENT BE USED TO DETERMINE APPROPRIATE ANTICOAGULANT THERAPY. Performed at [facility], [address] [place], [place] [address]    Glucose, capillary     Status: Abnormal    Collection Time: 05/01/23  4:40 PM  Result Value Ref Range    Glucose-Capillary 126 (H) 70 - 99 mg/dL      Comment: Glucose reference range applies only to samples taken after fasting for at least 8 hours.  Prepare RBC (crossmatch)     Status: None    Collection Time: 05/01/23  5:06 PM  Result Value Ref Range    Order Confirmation          ORDER PROCESSED BY BLOOD BANK Performed at [facility], [address], [place], [place] [address]    Prepare fresh frozen plasma     Status: None (Preliminary result)    Collection Time: 05/01/23  5:06 PM  Result Value Ref Range    Unit Number [id number]      Blood Component Type THW PLS APHR      Unit division A0      Status of Unit ISSUED      Transfusion Status          OK TO TRANSFUSE Performed at [facility], [address], [place], [place] [address]    Type and screen [facility]     Status: None (Preliminary result)    Collection Time: 05/01/23  6:10 PM  Result Value Ref Range    ABO/RH(D) A NEG      Antibody Screen NEG      Sample Expiration 05/04/2023,2359      Unit Number [id number]      Blood Component Type RED CELLS,LR      Unit division 00      Status of Unit ISSUED      Transfusion Status OK TO TRANSFUSE      Crossmatch  Result          Compatible Performed at [facility], [address],  [place], [place] [address]    Glucose, capillary     Status: Abnormal    Collection Time: 05/01/23  7:37 PM  Result Value Ref Range    Glucose-Capillary 117 (H) 70 - 99 mg/dL      Comment: Glucose reference range applies only to samples taken after fasting for at least 8 hours.  CBC     Status: Abnormal    Collection Time: 05/02/23  3:55 AM  Result Value Ref Range    WBC 9.3 4.0 - 10.5 K/uL    RBC 2.96 (L) 3.87 - 5.11 MIL/uL    Hemoglobin 8.1 (L) 12.0 - 15.0 g/dL    HCT 19.1 (L) 47.8 - 46.0 %    MCV 89.5 80.0 - 100.0 fL    MCH 27.4 26.0 - 34.0 pg    MCHC 30.6 30.0 - 36.0 g/dL    RDW 29.5 (H) 62.1 - 15.5 %    Platelets 462 (H) 150 - 400 K/uL    nRBC 0.0 0.0 - 0.2 %      Comment: Performed at [facility], [address], [place], [place] [address]  Glucose, capillary     Status: Abnormal    Collection Time: 05/02/23  7:31 AM  Result Value Ref Range    Glucose-Capillary 112 (H) 70 - 99 mg/dL      Comment: Glucose reference range applies only to samples taken after fasting for at least 8 hours.    *Note: Due to a large number of results and/or encounters for the requested time period, some results have not been displayed. A complete set of results can be found in Results Review.      Imaging Results (Last 48 hours)  No results found.         Blood pressure (!) 147/68, pulse 60, temperature 98.3 F (36.8 C), temperature source Oral, resp. rate 18, height 5' 4.5" (1.638 m), weight 102.5 kg, SpO2 100%.   Medical Problem List and Plan: 1. Functional deficits secondary to right trimalleolar ankle fx after syncopal episode.              -patient may shower if RLE is covered             -ELOS/Goals: 8-13 days,  mod I to supervision goals with PT and OT   2.  Antithrombotics: -DVT/anticoagulation:  Mechanical:  Antiembolism stockings, knee (TED hose) Bilateral lower extremities (chemo prophy d/c due to epistaxis, anemia)             -antiplatelet therapy: none   3. Pain  Management:  -continue Tylenol 1000 mg q 8 hours             -continue oxycodone 10 mg q 4 hours prn   4. Chronic depression/Insomnia: LCSW to evaluate and provide emotional support             -continue Celexa 20 mg daily             -trazodone 50 mg q HS prn sleep             -pt was on at bedtime remeron and xanax prn at home (not resumed)             -antipsychotic agents: n/a   5. Neuropsych/cognition: This patient is capable of making decisions on her own behalf.   6. Skin/Wound Care:  Routine skin care checks   7. 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 [facility]             -follow-up CBC             -goal to keep hgb >8   14: Intermittent epistaxis due to #11: - Afrin, other measures as needed             -consider ENT consultation for hemostasis              15: Right ankle fracture s/p ORIF 1/19, Dr. Hulda Humphrey (has nylon sutures and short leg splint with posterior slab and stirrups)             -NWB for 8-10 weeks             -follow-up ortho in 2-3 weeks post-op   16: Tobacco use: cessation counseling   17: Code status: DNR-limited       19: DM-2: A1c = 5.0% October 2024 (glucoses ~100-120s on regular diet)             -home metformin 500 mg BID not restarted             -d/c CBGs   20: Thrombocytosis: follow-up CBC   21: New onset nausea:             -pt reports that it came on after FFP yesterday afternoon.             -had  large bm this morning with no improvement in symptoms              -continue with prn tigan, observe for now, belly is benign             -I don't see any other obvious causes the moment.      @Sandra  J Setzer, PA-C 05/02/2023  I have personally performed a face to face diagnostic evaluation of this patient and formulated the key components of the plan.  Additionally, I have personally reviewed laboratory data, imaging studies, as well as relevant notes and concur with the physician assistant's documentation above.  The patient's status has not changed from the original H&P.  Any changes in documentation from the acute care chart have been noted above.  [name], MD, [place]

## 2023-05-02 NOTE — Discharge Summary (Signed)
Name: Peggy House MRN: [id number] DOB: 06-Feb-1961 63 y.o. PCP: Olegario Messier, MD  Date of Admission: 04/13/2023  3:10 AM Date of Discharge: 05/02/2023 Attending Physician: Dr. Lafonda Mosses  Discharge Diagnosis: Principal Problem:   Closed right trimalleolar fracture Active Problems:   Acute on chronic blood loss anemia   Anxiety   Essential hypertension   Obesity, Class III, BMI 40-49.9 (morbid obesity) (HCC)   Cough   Type 2 diabetes mellitus with vascular disease (HCC)   Syncope   HHT (hereditary hemorrhagic telangiectasia) (HCC)   Thrombocytopenia (HCC)    Discharge Medications: Allergies as of 05/02/2023       Reactions   Feraheme [ferumoxytol] Other (See Comments)   Muscle tetany, encephalopathy, fatigue, nausea (reaction to injection)   Nsaids Other (See Comments)   Stomach bleeding episodes; Tylenol is OK   Tomato Hives   Iron (ferrous Sulfate) [ferrous Sulfate Er] Other (See Comments)   Shock   Wasp Venom Swelling        Medication List     PAUSE taking these medications    mirtazapine 7.5 MG tablet Wait to take this until your doctor or other care provider tells you to start again. Commonly known as: REMERON Take 1 tablet (7.5 mg total) by mouth at bedtime.       STOP taking these medications    ondansetron 4 MG tablet Commonly known as: Zofran   Tylenol PM Extra Strength 500-25 MG Tabs tablet Generic drug: diphenhydramine-acetaminophen       TAKE these medications    Accu-Chek Guide test strip Generic drug: glucose blood Check blood sugar 2 times per day while on steroids before breakfast and dinner What changed:  how much to take how to take this when to take this   Accu-Chek Softclix Lancets lancets Use to check blood sugar before breakfast and before dinner while on steroids What changed:  how much to take how to take this when to take this   acetaminophen 500 MG tablet Commonly known as: TYLENOL Take 2 tablets  (1,000 mg total) by mouth every 8 (eight) hours.   ALPRAZolam 0.5 MG tablet Commonly known as: XANAX TAKE 1 TABLET BY MOUTH AT BEDTIME AS NEEDED FOR ANXIETY OR SLEEP   aminocaproic acid 500 MG tablet Commonly known as: Amicar Take 2 tablets (1,000 mg total) by mouth every 12 (twelve) hours.   amLODipine 10 MG tablet Commonly known as: NORVASC TAKE 1 TABLET(10 MG) BY MOUTH DAILY What changed: See the new instructions.   citalopram 20 MG tablet Commonly known as: CeleXA Take 1 tablet (20 mg total) by mouth daily.   lisinopril 20 MG tablet Commonly known as: ZESTRIL Take 20 mg by mouth daily.   metFORMIN 500 MG tablet Commonly known as: GLUCOPHAGE TAKE 1 TABLET(500 MG) BY MOUTH TWICE DAILY WITH A MEAL   nicotine 14 mg/24hr patch Commonly known as: NICODERM CQ - dosed in mg/24 hours Place 1 patch (14 mg total) onto the skin daily. What changed: when to take this   Oxycodone HCl 10 MG Tabs Take 1 tablet (10 mg total) by mouth every 4 (four) hours as needed for up to 5 days for severe pain (pain score 7-10) (Try oral before IV medicine.).   pantoprazole 40 MG tablet Commonly known as: PROTONIX TAKE 1 TABLET(40 MG) BY MOUTH TWICE DAILY What changed: See the new instructions.   ProAir HFA 108 (90 Base) MCG/ACT inhaler Generic drug: albuterol INHALE 1 TO 2 PUFFS INTO THE LUNGS EVERY 6  HOURS AS NEEDED FOR WHEEZING OR SHORTNESS OF BREATH What changed: See the new instructions.   senna-docusate 8.6-50 MG tablet Commonly known as: Senokot-S Take 1 tablet by mouth at bedtime as needed for mild constipation.   traZODone 50 MG tablet Commonly known as: DESYREL TAKE 1 TABLET BY MOUTH AT BEDTIME What changed:  medication strength how much to take how to take this when to take this additional instructions        Disposition and follow-up:   [name] was discharged from [facility] in [place] condition.  At the hospital follow up visit please  address:  1.  Follow-up:  a.  Anemia - Hb stable at 8.1.  No recent nosebleed.   b.  Hypertension - BP stable.  Discharged on lisinopril 20 mg and amlodipine 10 mg.  c.  Trimalleolar ankle fracture Status post closed reduction and ORIF with Ortho.   - Please ensure patient knows about the follow-up with Ortho.  2.  Labs / imaging needed at time of follow-up: CBC   3.  Medication Changes Discontinued ondansetron due to QT prolongation.  Follow-up Appointments: [facility] Course by problem list: Peggy House is a 63 y.o. woman with a PMH of hereditary hemorrhagic telangiectasia, chronic iron deficiency anemia, type 2 diabetes, tobacco use disorder, GAD who presented following a syncopal episode and admitted for symptomatic anemia and R ankle fracture.  Symptomatic anemia Hereditary hemorrhagic telangiectasia She presented with a hemoglobin of 5. She experiences frequent epistaxis and requires weekly transfusions outpatient with difficultly maintaining Hg above 8.   While hospitalized, received blood transfusion, one-time dose of FFP.  We discontinued chemoprophylaxis with Xarelto due to high risk of bleeding, transition to SCDs. - Monitor   Trimalleolar R Ankle Fracture Ankle fracture occurred in setting of her fall/syncope. Orthopedics performed closed reduction Sunday 12/29, allowed time for swelling to improve, and performed ORIF on 1/9. She is to be non weight bearing of the RLE for 8-12 weeks thereafter.  Patient was discharged to inpatient rehab.  - Discharged with a 5-day course of oxycodone 10 mg every 4 hours. - Follow-up with orthopedics outpatient.  Diabetes -restarted metformin on the day of discharge.  Hypertension -Held lisinopril on admission due to AKI, and continue amlodipine.  AKI resolved, patient is back on lisinopril and amlodipine for blood pressure control.   Discharge Subjective: Patient evaluated at bedside this AM.  Reporting  nausea, no vomiting.  Reports getting nausea medicine.  Discharge Exam:   BP (!) 147/68   Pulse 60   Temp 98.3 F (36.8 C) (Oral)   Resp 18   Ht 5' 4.5" (1.638 m)   Wt 102.5 kg   SpO2 100%   BMI 38.19 kg/m   Constitutional: Not in acute distress. Cardiovascular: regular rate and rhythm, no m/r/g Pulmonary/Chest: normal work of breathing on room air, lungs clear to auscultation bilaterally Abdominal: soft, non-tender, non-distended MSK: normal bulk and tone. R ankle in soft cast. R leg/foot is neurovascularly inact. Toes are warm and able to move.   Psych: Mood and affect appropriate.  Pertinent Labs, Studies, and Procedures:     Latest Ref Rng & Units 05/02/2023    3:55 AM 05/01/2023   12:19 AM 04/30/2023    3:14 AM  CBC  WBC 4.0 - 10.5 K/uL 9.3  9.1  9.1   Hemoglobin 12.0 - 15.0 g/dL 8.1  7.5  7.6   Hematocrit 36.0 - 46.0 % 26.5  25.5  25.7   Platelets 150 - 400 K/uL 462  464  432        Latest Ref Rng & Units 04/30/2023    3:14 AM 04/29/2023    2:27 AM 04/28/2023    2:48 AM  CMP  Glucose 70 - 99 mg/dL 865  784  696   BUN 8 - 23 mg/dL 22  28  37   Creatinine 0.44 - 1.00 mg/dL 2.95  2.84  1.32   Sodium 135 - 145 mmol/L 142  141  138   Potassium 3.5 - 5.1 mmol/L 3.9  4.2  3.8   Chloride 98 - 111 mmol/L 110  109  106   CO2 22 - 32 mmol/L 26  26  24    Calcium 8.9 - 10.3 mg/dL 8.5  8.4  8.1     ECHOCARDIOGRAM COMPLETE Result Date: 04/14/2023    ECHOCARDIOGRAM REPORT   Patient Name:   Peggy House Date of Exam: 04/14/2023 Medical Rec #:  [id number]              Height:       63.0 in Accession #:    [id number]             Weight:       229.5 lb Date of Birth:  01-Jul-1960              BSA:          2.050 m Patient Age:    62 years               BP:           143/63 mmHg Patient Gender: F                      HR:           62 bpm. Exam Location:  Inpatient Procedure: 2D Echo, Cardiac Doppler, Color Doppler and Intracardiac            Opacification Agent Indications:     murmur  History:        Patient has prior history of Echocardiogram examinations, most                 recent 05/08/2017. Risk Factors:Hypertension and Diabetes.  Sonographer:    Webb Laws Referring Phys: [id number] JULIE ANNE WILLIAMS IMPRESSIONS  1. Left ventricular ejection fraction, by estimation, is 60 to 65%. The left ventricle has normal function. The left ventricle has no regional wall motion abnormalities. Left ventricular diastolic parameters are consistent with Grade II diastolic dysfunction (pseudonormalization).  2. Right ventricular systolic function is normal. The right ventricular size is normal.  3. Left atrial size was moderately dilated.  4. The mitral valve is normal in structure. No evidence of mitral valve regurgitation. No evidence of mitral stenosis.  5. The aortic valve is normal in structure. Aortic valve regurgitation is trivial. No aortic stenosis is present. Aortic valve mean gradient measures 12.0 mmHg.  6. The inferior vena cava is normal in size with greater than 50% respiratory variability, suggesting right atrial pressure of 3 mmHg. FINDINGS  Left Ventricle: Left ventricular ejection fraction, by estimation, is 60 to 65%. The left ventricle has normal function. The left ventricle has no regional wall motion abnormalities. Definity contrast agent was given IV to delineate the left ventricular  endocardial borders. The left ventricular internal cavity size was normal in size. There is no left ventricular hypertrophy. Left ventricular diastolic parameters  are consistent with Grade II diastolic dysfunction (pseudonormalization). Right Ventricle: The right ventricular size is normal. No increase in right ventricular wall thickness. Right ventricular systolic function is normal. Left Atrium: Left atrial size was moderately dilated. Right Atrium: Right atrial size was normal in size. Pericardium: There is no evidence of pericardial effusion. Mitral Valve: The mitral valve is normal in  structure. No evidence of mitral valve regurgitation. No evidence of mitral valve stenosis. Tricuspid Valve: The tricuspid valve is normal in structure. Tricuspid valve regurgitation is mild . No evidence of tricuspid stenosis. Aortic Valve: The aortic valve is normal in structure. Aortic valve regurgitation is trivial. No aortic stenosis is present. Aortic valve mean gradient measures 12.0 mmHg. Aortic valve peak gradient measures 25.9 mmHg. Aortic valve area, by VTI measures 2.08 cm. Pulmonic Valve: The pulmonic valve was normal in structure. Pulmonic valve regurgitation is not visualized. No evidence of pulmonic stenosis. Aorta: The aortic root is normal in size and structure. Venous: The inferior vena cava is normal in size with greater than 50% respiratory variability, suggesting right atrial pressure of 3 mmHg. IAS/Shunts: No atrial level shunt detected by color flow Doppler.  LEFT VENTRICLE PLAX 2D LVIDd:         5.10 cm      Diastology LVIDs:         3.10 cm      LV e' medial:    5.87 cm/s LV PW:         1.10 cm      LV E/e' medial:  22.0 LV IVS:        1.00 cm      LV e' lateral:   7.83 cm/s LVOT diam:     2.00 cm      LV E/e' lateral: 16.5 LV SV:         107 LV SV Index:   52 LVOT Area:     3.14 cm  LV Volumes (MOD) LV vol d, MOD A2C: 148.0 ml LV vol d, MOD A4C: 197.0 ml LV vol s, MOD A2C: 48.3 ml LV vol s, MOD A4C: 63.8 ml LV SV MOD A2C:     99.7 ml LV SV MOD A4C:     197.0 ml LV SV MOD BP:      115.6 ml RIGHT VENTRICLE             IVC RV Basal diam:  4.70 cm     IVC diam: 2.00 cm RV S prime:     14.90 cm/s TAPSE (M-mode): 3.1 cm LEFT ATRIUM              Index        RIGHT ATRIUM           Index LA diam:        4.40 cm  2.15 cm/m   RA Area:     22.50 cm LA Vol (A2C):   113.0 ml 55.12 ml/m  RA Volume:   72.70 ml  35.46 ml/m LA Vol (A4C):   93.8 ml  45.75 ml/m LA Biplane Vol: 105.0 ml 51.22 ml/m  AORTIC VALVE AV Area (Vmax):    1.83 cm AV Area (Vmean):   1.94 cm AV Area (VTI):     2.08 cm AV  Vmax:           254.60 cm/s AV Vmean:          154.667 cm/s AV VTI:  0.514 m AV Peak Grad:      25.9 mmHg AV Mean Grad:      12.0 mmHg LVOT Vmax:         148.00 cm/s LVOT Vmean:        95.500 cm/s LVOT VTI:          0.341 m LVOT/AV VTI ratio: 0.66  AORTA Ao Root diam: 2.90 cm Ao Asc diam:  3.70 cm MITRAL VALVE                TRICUSPID VALVE MV Area (PHT): 2.75 cm     TR Peak grad:   26.8 mmHg MV E velocity: 129.00 cm/s  TR Vmax:        259.00 cm/s MV A velocity: 122.00 cm/s MV E/A ratio:  1.06         SHUNTS                             Systemic VTI:  0.34 m                             Systemic Diam: 2.00 cm Aditya Sabharwal Electronically signed by Dorthula Nettles Signature Date/Time: 04/14/2023/10:36:54 AM    Final    DG Ankle Right Port Result Date: 04/13/2023 CLINICAL DATA:  Status post closed ankle reduction. EXAM: PORTABLE RIGHT ANKLE - 2 VIEW COMPARISON:  Radiograph from earlier the same day. FINDINGS: Overlying fiberglass cast obscures fine bony details. Status post reduction of the right ankle. There is improved alignment of the ankle mortise. There is also improved alignment of the medial and lateral malleolar fractures. No other acute fracture or dislocation seen. Calcaneal spur noted along the Achilles tendon and Plantar aponeurosis attachment sites. No radiopaque foreign bodies.  No focal soft tissue swelling. IMPRESSION: *Improved alignment of ankle fracture/dislocation, status post reduction. Electronically Signed   By: Jules Schick M.D.   On: 04/13/2023 11:19   CT HEAD WO CONTRAST Result Date: 04/13/2023 CLINICAL DATA:  63 year old female with history of lightheadedness and unwitnessed fall. EXAM: CT HEAD WITHOUT CONTRAST TECHNIQUE: Contiguous axial images were obtained from the base of the skull through the vertex without intravenous contrast. RADIATION DOSE REDUCTION: This exam was performed according to the departmental dose-optimization program which includes automated  exposure control, adjustment of the mA and/or kV according to patient size and/or use of iterative reconstruction technique. COMPARISON:  Head CT 08/01/2007. FINDINGS: Brain: Physiologic calcifications in the basal ganglia bilaterally. No evidence of acute infarction, hemorrhage, hydrocephalus, extra-axial collection or mass lesion/mass effect. Vascular: No hyperdense vessel or unexpected calcification. Skull: Normal. Negative for fracture or focal lesion. Sinuses/Orbits: No acute finding. Small mucosal retention cyst or polyp in the medial aspect of the left maxillary sinus, similar to remote prior study. Other: None. IMPRESSION: 1. No evidence of significant acute traumatic injury to the skull or brain. Electronically Signed   By: Trudie Reed M.D.   On: 04/13/2023 08:56   DG Ankle Complete Right Result Date: 04/13/2023 CLINICAL DATA:  63 year old female with history of trauma from a fall complaining of right ankle pain. EXAM: RIGHT ANKLE - COMPLETE 3+ VIEW COMPARISON:  No priors. FINDINGS: Acute fracture/subluxation of the right ankle is noted. Specifically, there is disruption of the normal ankle mortise which is widened approximately 1.2 cm medially, secondary to lateral subluxation of the talus relative to the tibia. Mildly displaced fracture of the medial  malleolus (7 mm) is noted. There is also a fracture of the lateral malleolus which is minimally displaced (3 mm laterally) and minimally angulated (approximately 20 degrees of lateral angulation). Irregularity of the posterior malleolus of the distal tibia is also noted, which could suggest a nondisplaced posterior malleolar fracture. Soft tissues are mildly swollen surrounding the ankle joint. Multifocal joint space narrowing, subchondral sclerosis, subchondral cyst formation and osteophyte formation is noted throughout the visualized portions of the midfoot and hindfoot, indicative of osteoarthritis. IMPRESSION: 1. Acute fracture/subluxation of the  right ankle, favored to be trimalleolar, as detailed above. Electronically Signed   By: Trudie Reed M.D.   On: 04/13/2023 06:54     Discharge Instructions: Discharge Instructions     Call MD for:  difficulty breathing, headache or visual disturbances   Complete by: As directed    Diet - low sodium heart healthy   Complete by: As directed    Discharge instructions   Complete by: As directed    - Follow up with PCP.   Increase activity slowly   Complete by: As directed    No wound care   Complete by: As directed        Signed: Laretta Bolster, MD 05/02/2023, 1:57 PM   Pager: [phone]

## 2023-05-02 NOTE — H&P (Signed)
Physical Medicine and Rehabilitation Admission H&P   CC: Functional deficits secondary to debility  HPI: Peggy House is a 63 year old female with a history of HTT presented to the ED on 04/13/2023 reporting syncopal episode and fall resulting in right ankle fracture.  She exhibited profound anemia on lab work with a hemoglobin 5.1. She underwent ORIF by Dr. Hulda Humphrey on 1/09 and she is NWB for 6-8 weeks. She continued to have nose bleeds, per primary team, the risk of chemoppx for DVT outweighs the benefits. Xarelto discontinued and SCDs placed for DVT ppx. Transfused additional unit of pRBC for a total of 10 units since admission with one unit of FFP. Follows with Dr. Malachy Mood. Cardiac workup for syncope, echo showed grade 2 diastolic dysfunction.  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 s/p IR embolization of gastric artery branch vessels on 12/10/17, cauterization of stomach for severe GI bleed in 11/2018, and epinephrine injection for bleeding ulcer on 11/03/20. She has required frequent blood transfusions, IV iron and bevacizumab. Has indwelling right internal jugular venous access device. PMH significant for DM-2, hypertension, depression. Pt able to stand pivot to [facility] with min A for RLE NWB and balance and dense cues for technique to pivot. Pt demonstrates difficulty increasing BUE support to pivot on LLE with tendency to hop on LLE, but is able to improve with cues and practice. The patient requires inpatient medicine and rehabilitation evaluations and services for ongoing dysfunction secondary to debility.    Review of Systems  Constitutional:  Positive for malaise/fatigue. Negative for chills and fever.  HENT:  Negative for hearing loss.   Eyes:  Negative for blurred vision and double vision.  Respiratory:  Negative for cough.   Cardiovascular:  Negative for chest pain.  Gastrointestinal:  Positive for constipation and nausea.  Genitourinary:  Negative  for dysuria.  Musculoskeletal:  Positive for falls and joint pain. Negative for myalgias.  Neurological:  Positive for dizziness and weakness.  Endo/Heme/Allergies:  Bruises/bleeds easily.  Psychiatric/Behavioral:  Negative for depression. The patient is nervous/anxious.    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;  Service: Orthopedics;  Laterality: Left;   COLONOSCOPY N/A 03/02/2020   Procedure: COLONOSCOPY;  Surgeon: Bernette Redbird, MD;  Location: [facility] ENDOSCOPY;  Service: Endoscopy;  Laterality: N/A;   COLONOSCOPY WITH PROPOFOL N/A 04/28/2014   Procedure: COLONOSCOPY WITH PROPOFOL;  Surgeon: Florencia Reasons, MD;  Location: [facility] ENDOSCOPY;  Service: Endoscopy;  Laterality: N/A;   DG TOES*L*  2/10   rt   DILATION AND CURETTAGE OF UTERUS     ENTEROSCOPY N/A 10/17/2017   Procedure: ENTEROSCOPY;  Surgeon: Kathi Der, MD;  Location: [facility] ENDOSCOPY;  Service: Gastroenterology;  Laterality: N/A;   ESOPHAGOGASTRODUODENOSCOPY N/A 04/10/2014   Procedure: ESOPHAGOGASTRODUODENOSCOPY (EGD);  Surgeon: Shirley Friar, MD;  Location: [facility] ENDOSCOPY;  Service: Endoscopy;  Laterality: N/A;   ESOPHAGOGASTRODUODENOSCOPY N/A 05/10/2017   Procedure: ESOPHAGOGASTRODUODENOSCOPY (EGD);  Surgeon: Bernette Redbird, MD;  Location: [facility] ENDOSCOPY;  Service: Endoscopy;  Laterality: N/A;   ESOPHAGOGASTRODUODENOSCOPY N/A 09/22/2017   Procedure:  ESOPHAGOGASTRODUODENOSCOPY (EGD);  Surgeon: Vida Rigger, MD;  Location: [facility] ENDOSCOPY;  Service: Endoscopy;  Laterality: N/A;  bedside   ESOPHAGOGASTRODUODENOSCOPY N/A 03/02/2020   Procedure: ESOPHAGOGASTRODUODENOSCOPY (EGD);  Surgeon: Bernette Redbird, MD;  Location: [facility];  Service: Endoscopy;  Laterality: N/A;   ESOPHAGOGASTRODUODENOSCOPY N/A 11/03/2020   Procedure: ESOPHAGOGASTRODUODENOSCOPY (EGD);  Surgeon: Charlott Rakes, MD;  Location: [facility] ENDOSCOPY;  Service: Endoscopy;  Laterality: N/A;   ESOPHAGOGASTRODUODENOSCOPY N/A 04/12/2022   Procedure: ESOPHAGOGASTRODUODENOSCOPY (EGD);  Surgeon: Vida Rigger, MD;  Location: [facility] ENDOSCOPY;  Service: Gastroenterology;  Laterality: N/A;   ESOPHAGOGASTRODUODENOSCOPY N/A 05/27/2022   Procedure: ESOPHAGOGASTRODUODENOSCOPY (EGD);  Surgeon: Willis Modena, MD;  Location: [facility] ENDOSCOPY;  Service: Gastroenterology;  Laterality: N/A;   ESOPHAGOGASTRODUODENOSCOPY N/A 09/27/2022   Procedure: ESOPHAGOGASTRODUODENOSCOPY (EGD);  Surgeon: Lynann Bologna, DO;  Location: [facility] ENDOSCOPY;  Service: Gastroenterology;  Laterality: N/A;   ESOPHAGOGASTRODUODENOSCOPY (EGD) WITH PROPOFOL N/A 04/27/2014   Procedure: ESOPHAGOGASTRODUODENOSCOPY (EGD) WITH PROPOFOL;  Surgeon: Florencia Reasons, MD;  Location: [name] ENDOSCOPY;  Service: Endoscopy;  Laterality: N/A;  possible apc   ESOPHAGOGASTRODUODENOSCOPY (EGD) WITH PROPOFOL N/A 09/30/2017   Procedure: ESOPHAGOGASTRODUODENOSCOPY (EGD) WITH PROPOFOL;  Surgeon: Kerin Salen, MD;  Location: [facility] ENDOSCOPY;  Service: Gastroenterology;  Laterality: N/A;   ESOPHAGOGASTRODUODENOSCOPY (EGD) WITH PROPOFOL N/A 10/01/2017   Procedure: ESOPHAGOGASTRODUODENOSCOPY (EGD) WITH PROPOFOL;  Surgeon: Kerin Salen, MD;  Location: [facility] ENDOSCOPY;  Service: Gastroenterology;  Laterality: N/A;   ESOPHAGOGASTRODUODENOSCOPY (EGD) WITH PROPOFOL N/A 10/08/2017   Procedure: ESOPHAGOGASTRODUODENOSCOPY (EGD) WITH PROPOFOL;  Surgeon: Kathi Der, MD;  [facility]  ENDOSCOPY;  Service: Gastroenterology;  Laterality: N/A;   ESOPHAGOGASTRODUODENOSCOPY (EGD) WITH PROPOFOL N/A 10/17/2017   Procedure: ESOPHAGOGASTRODUODENOSCOPY (EGD) WITH PROPOFOL;  Surgeon: Kathi Der, MD;  [facility] ENDOSCOPY;  Service: Gastroenterology;  Laterality: N/A;   ESOPHAGOGASTRODUODENOSCOPY (EGD) WITH PROPOFOL N/A 10/19/2017   Procedure: ESOPHAGOGASTRODUODENOSCOPY (EGD) WITH PROPOFOL;  Surgeon: Kathi Der, MD;  [facility]OSCOPY;  Service: Gastroenterology;  Laterality: N/A;   ESOPHAGOGASTRODUODENOSCOPY (EGD) WITH PROPOFOL N/A 12/04/2018   Procedure: ESOPHAGOGASTRODUODENOSCOPY (EGD) WITH PROPOFOL;  Surgeon: Charlott Rakes, MD;  [facility] ENDOSCOPY;  Service: Endoscopy;  Laterality: N/A;   GIVENS CAPSULE STUDY N/A 10/02/2017   Procedure: GIVENS CAPSULE STUDY;  Surgeon: Kerin Salen, MD;  Location: [facility] ENDOSCOPY;  Service: Gastroenterology;  Laterality: N/A;   GIVENS CAPSULE STUDY N/A 10/08/2017   Procedure: GIVENS CAPSULE STUDY;  Surgeon: Kathi Der, MD;  [facility] ENDOSCOPY;  Service: Gastroenterology;  Laterality: N/A;  endoscopic placement of capsule   GIVENS CAPSULE STUDY N/A 03/02/2020   Procedure: GIVENS CAPSULE STUDY;  Surgeon: Bernette Redbird, MD;  Location[facility] ENDOSCOPY;  Service: Endoscopy;  Laterality: N/A;   HEMOSTASIS CLIP PLACEMENT  12/04/2018   Procedure: HEMOSTASIS CLIP PLACEMENT;  Surgeon: Charlott Rakes, MD;  [facility] ENDOSCOPY;  Service: Endoscopy;;   HEMOSTASIS CLIP PLACEMENT  05/27/2022   Procedure: HEMOSTASIS CLIP PLACEMENT;  Surgeon: Willis Modena, MD;  [facility] ENDOSCOPY;  Service: Gastroenterology;;   HEMOSTASIS CLIP PLACEMENT  09/27/2022   Procedure: HEMOSTASIS CLIP PLACEMENT;  Surgeon: Lynann Bologna, DO;  Location: [facility] ENDOSCOPY;  Service: Gastroenterology;;   HEMOSTASIS CONTROL  05/27/2022   Procedure: HEMOSTASIS CONTROL;  Surgeon: Willis Modena, MD;  [facility]OS[facility];  Service: Gastroenterology;;   HOT  HEMOSTASIS N/A 04/27/2014   Procedure: HOT HEMOSTASIS (ARGON PLASMA COAGULATION/BICAP);  Surgeon: Florencia Reasons, MD;  Location: [facility] ENDOSCOPY;  Service: Endoscopy;  Laterality: N/A;   HOT HEMOSTASIS N/A 09/30/2017   Procedure: HOT HEMOSTASIS (ARGON PLASMA COAGULATION/BICAP);  Surgeon: Kerin Salen, MD;  Location: [facility] ENDOSCOPY;  Service: Gastroenterology;  Laterality: N/A;   HOT HEMOSTASIS N/A 10/01/2017   Procedure: HOT HEMOSTASIS (ARGON PLASMA COAGULATION/BICAP);  Surgeon: Kerin Salen, MD;  Location: [facility] ENDOSCOPY;  Service: Gastroenterology;  Laterality: N/A;   HOT HEMOSTASIS N/A 10/17/2017   Procedure: HOT HEMOSTASIS (ARGON PLASMA COAGULATION/BICAP);  Surgeon: Kathi Der, MD;  Location: [facility] ENDOSCOPY;  Service: Gastroenterology;  Laterality: N/A;   HOT HEMOSTASIS N/A 10/19/2017   Procedure: HOT HEMOSTASIS (ARGON PLASMA COAGULATION/BICAP);  Surgeon: Kathi Der, MD;  Location: [facility] ENDOSCOPY;  Service: Gastroenterology;  Laterality: N/A;   HOT HEMOSTASIS N/A 03/02/2020   Procedure: HOT HEMOSTASIS (ARGON PLASMA COAGULATION/BICAP);  Surgeon: Bernette Redbird, MD;  Location: [facility] ENDOSCOPY;  Service: Endoscopy;  Laterality: N/A;   HOT HEMOSTASIS N/A 04/12/2022   Procedure: HOT HEMOSTASIS (ARGON PLASMA COAGULATION/BICAP);  Surgeon: Vida Rigger, MD;  Location: [facility]OSCOPY;  Service: Gastroenterology;  Laterality: N/A;   IR IMAGING GUIDED PORT INSERTION  07/08/2018   L shoulder Surgery  2011   ORIF ANKLE FRACTURE Right 04/24/2023   Procedure: OPEN REDUCTION INTERNAL FIXATION (ORIF) ANKLE FRACTURE;  Surgeon: Luci Bank, MD;  [facility] OR;  Service: Orthopedics;  Laterality: Right;   POLYPECTOMY  03/02/2020   Procedure: POLYPECTOMY;  Surgeon: Bernette Redbird, MD;  Location[facility] ENDOSCOPY;  Service: Endoscopy;;   SCLEROTHERAPY  11/03/2020   Procedure: Susa Day;  Surgeon: Charlott Rakes, MD;  Location: [facility] ENDOSCOPY;  Service: Endoscopy;;   SUBMUCOSAL INJECTION  09/22/2017   Procedure:  SUBMUCOSAL INJECTION;  Surgeon: Vida Rigger, MD;  Location: [facility] ENDOSCOPY;  Service: Endoscopy;;   SUBMUCOSAL INJECTION  12/04/2018   Procedure: SUBMUCOSAL INJECTION;  Surgeon: Charlott Rakes, MD;  Location: [facility] ENDOSCOPY;  Service: Endoscopy;;   Family History  Problem Relation Age of Onset   Cancer Mother        Ovarian   Ovarian cancer Mother    Diabetes Mother    Kidney disease Mother    Bleeding Disorder Mother    Diabetes type II Sister    Bleeding Disorder Sister    Diabetes Sister    Colon cancer Maternal Grandfather    Crohn's disease Maternal Grandfather    Stomach cancer Maternal Grandmother    Kidney disease Son    Bleeding Disorder Son    Dysmenorrhea Neg Hx    Social History:  reports that she has been smoking cigarettes. She has a 15 pack-year smoking history. She quit smokeless tobacco use about 44 years ago.  Her smokeless tobacco use included snuff. She reports that she does not drink alcohol and does not use drugs. Allergies:  Allergies  Allergen Reactions   Feraheme [Ferumoxytol] Other (See Comments)    Muscle tetany, encephalopathy, fatigue, nausea (reaction to injection)   Nsaids Other (See Comments)    Stomach bleeding episodes; Tylenol is OK   Tomato Hives   Iron (Ferrous Sulfate) [Ferrous Sulfate Er] Other (See Comments)    Shock   Wasp Venom Swelling   Medications Prior to Admission  Medication Sig Dispense Refill   Accu-Chek Softclix Lancets lancets Use to check blood sugar before breakfast and before dinner while on steroids (Patient taking differently: 1 each by Other route See admin instructions. Use to check blood sugar before breakfast and before dinner while on steroids) 100 each 1   ALPRAZolam (  XANAX) 0.5 MG tablet TAKE 1 TABLET BY MOUTH AT BEDTIME AS NEEDED FOR ANXIETY OR SLEEP 7 tablet 0   aminocaproic acid (AMICAR) 500 MG tablet Take 2 tablets (1,000 mg total) by mouth every 12 (twelve) hours. 120 tablet 3   amLODipine (NORVASC) 10 MG  tablet TAKE 1 TABLET(10 MG) BY MOUTH DAILY (Patient taking differently: Take 10 mg by mouth daily.) 90 tablet 3   citalopram (CELEXA) 20 MG tablet Take 1 tablet (20 mg total) by mouth daily. 90 tablet 3   glucose blood (ACCU-CHEK GUIDE) test strip Check blood sugar 2 times per day while on steroids before breakfast and dinner (Patient taking differently: 1 each by Other route See admin instructions. Check blood sugar 2 times per day while on steroids before breakfast and dinner) 50 each 3   lisinopril (ZESTRIL) 20 MG tablet Take 20 mg by mouth daily.     metFORMIN (GLUCOPHAGE) 500 MG tablet TAKE 1 TABLET(500 MG) BY MOUTH TWICE DAILY WITH A MEAL 180 tablet 3   mirtazapine (REMERON) 7.5 MG tablet Take 1 tablet (7.5 mg total) by mouth at bedtime. 30 tablet 1   nicotine (NICODERM CQ - DOSED IN MG/24 HOURS) 14 mg/24hr patch Place 1 patch (14 mg total) onto the skin daily. (Patient taking differently: Place 14 mg onto the skin daily.) 30 patch 3   ondansetron (ZOFRAN) 4 MG tablet Take 1 tablet (4 mg total) by mouth every 8 (eight) hours as needed for nausea or vomiting. 30 tablet 0   pantoprazole (PROTONIX) 40 MG tablet TAKE 1 TABLET(40 MG) BY MOUTH TWICE DAILY (Patient taking differently: Take 40 mg by mouth daily.) 180 tablet 1   PROAIR HFA 108 (90 Base) MCG/ACT inhaler INHALE 1 TO 2 PUFFS INTO THE LUNGS EVERY 6 HOURS AS NEEDED FOR WHEEZING OR SHORTNESS OF BREATH (Patient taking differently: Inhale 1-2 puffs into the lungs every 6 (six) hours as needed for wheezing or shortness of breath.) 8.5 g 0   traZODone (DESYREL) 100 MG tablet TAKE 1 TABLET BY MOUTH AT BEDTIME 30 tablet 2   TYLENOL PM EXTRA STRENGTH 500-25 MG TABS tablet Take 2 tablets by mouth at bedtime as needed (for sleep, knee pain).        Home: Home Living Family/patient expects to be discharged to:: Private residence Living Arrangements: Children (Son) Available Help at Discharge: Family, Available PRN/intermittently (son works but  sister can stay as long as needed) Type of Home: Apartment Home Access: Level entry Home Layout: One level Bathroom Shower/Tub: Sponge bathes at baseline, Tub/shower unit Bathroom Toilet: Standard (BSC on top) Bathroom Accessibility: Yes Home Equipment: Agricultural consultant (2 wheels), BSC/3in1 Additional Comments: Pt lives with son who works 3pm-3am   Functional History: Prior Function Prior Level of Function : Needs assist Mobility Comments: pt reported using RW "off and on" for a few months ADLs Comments: needed assist with dressing  Functional Status:  Mobility: Bed Mobility Overal bed mobility: Needs Assistance Bed Mobility: Supine to Sit Rolling: Supervision Sidelying to sit: Used rails, HOB elevated, Contact guard assist Supine to sit: Used rails, Min assist, HOB elevated Sit to supine: Min assist, HOB elevated, Used rails General bed mobility comments: Pt in recliner at beginning and end of session Transfers Overall transfer level: Needs assistance Equipment used: Rolling walker (2 wheels), None Transfers: Bed to chair/wheelchair/BSC, Sit to/from Stand Sit to Stand: +2 safety/equipment, Mod assist Bed to/from chair/wheelchair/BSC transfer type:: Lateral/scoot transfer, Stand pivot Stand pivot transfers: Min assist  Lateral/Scoot Transfers: Contact  guard assist General transfer comment: Lateral scoot from recliner to bed with CGA for safety and cues for technique and RLE NWB. Increased time required due to fatigue. Cues for improved LLE support for power up to scoot due to pt relying heavily on momentum and only scooting minimally with pt able to improve. STS from EOB and BSC with cues for hand placement and up to mod A for power up. Cues for RLE NWB with pt able to maintain. Pt able to stand pivot to [facility] with min A for RLE NWB and balance and dense cues for technique to pivot. Pt demonstrates difficulty increasing BUE support to pivot on LLE with tendency to hop on LLE, but is  able to improve with cues and practice. Ambulation/Gait General Gait Details: unable    ADL: ADL Overall ADL's : Needs assistance/impaired Eating/Feeding: Set up, Sitting Grooming: Wash/dry hands, Wash/dry face, Oral care, Set up, Sitting Upper Body Bathing: Set up, Sitting Lower Body Bathing: Moderate assistance, Maximal assistance, Sitting/lateral leans Upper Body Dressing : Set up, Sitting Lower Body Dressing: Maximal assistance, Total assistance, Sitting/lateral leans Toilet Transfer: Minimal assistance, BSC/3in1 (incremental scoot transfer) Toilet Transfer Details (indicate cue type and reason): simulated with scoot transfer Toileting- Clothing Manipulation and Hygiene: Moderate assistance, Sitting/lateral lean Toileting - Clothing Manipulation Details (indicate cue type and reason): for thoroughness Functional mobility during ADLs: Moderate assistance, Rolling walker (2 wheels) General ADL Comments: Patient focusing on OOB activity and functional independence with ADLs, patient is close to being able to complete an incremental scoot transfer at mod I level, though continues to require light min A for RLE management and cues for hand placement and safety. Patient is a mod A - total A for lower body ADL management. Given patient's ability to progress, her previous level of independence, and her ability to have 24/7 support, OT recommending intensive therapy > 3 hours in order to return to prior level of function.  Cognition: Cognition Overall Cognitive Status: Within Functional Limits for tasks assessed Orientation Level: Oriented X4 Cognition Arousal: Alert Behavior During Therapy: WFL for tasks assessed/performed Overall Cognitive Status: Within Functional Limits for tasks assessed  Physical Exam: Blood pressure (!) 147/68, pulse 60, temperature 98.3 F (36.8 C), temperature source Oral, resp. rate 18, height 5' 4.5" (1.638 m), weight 102.5 kg, SpO2 100%. Physical  Exam Constitutional:      General: She is not in acute distress.    Appearance: She is obese. She is not ill-appearing.  HENT:     Head: Normocephalic and atraumatic.     Right Ear: External ear normal.     Left Ear: External ear normal.     Nose: Nose normal. No congestion.  Eyes:     Extraocular Movements: Extraocular movements intact.     Pupils: Pupils are equal, round, and reactive to light.  Cardiovascular:     Rate and Rhythm: Normal rate and regular rhythm.     Heart sounds: No murmur heard.    No gallop.  Pulmonary:     Effort: Pulmonary effort is normal. No respiratory distress.     Breath sounds: No wheezing.  Abdominal:     General: Bowel sounds are normal. There is no distension.     Palpations: Abdomen is soft.     Tenderness: There is no abdominal tenderness.  Musculoskeletal:        General: Tenderness (RLE) present. No swelling.     Cervical back: Normal range of motion.     Right lower leg:  Edema present.     Left lower leg: No edema.  Skin:    General: Skin is warm and dry.  Neurological:     Mental Status: She is alert.     Comments: Alert and oriented x 3. Normal insight and awareness. Intact Memory. Normal language and speech. Cranial nerve exam unremarkable. MMT: BUE 4/5 prox to 5/5 distally. RLE limited by pain/splint, has at least 2/5 at hip, knee and can wiggle all toes. LLE 3+HF, KE and 5/5 ADF/PF. Sensory exam normal for light touch and pain in all 4 limbs. No limb ataxia or cerebellar signs. No abnormal tone appreciated.  [name]    Psychiatric:        Mood and Affect: Mood normal.        Behavior: Behavior normal.     Results for orders placed or performed during the hospital encounter of 04/13/23 (from the past 48 hours)  Glucose, capillary     Status: Abnormal   Collection Time: 04/30/23 11:41 AM  Result Value Ref Range   Glucose-Capillary 131 (H) 70 - 99 mg/dL    Comment: Glucose reference range applies only to samples taken after fasting for at  least 8 hours.  Glucose, capillary     Status: Abnormal   Collection Time: 04/30/23  3:13 PM  Result Value Ref Range   Glucose-Capillary 126 (H) 70 - 99 mg/dL    Comment: Glucose reference range applies only to samples taken after fasting for at least 8 hours.   Comment 1 Notify RN    Comment 2 Document in Chart   Glucose, capillary     Status: Abnormal   Collection Time: 04/30/23  9:48 PM  Result Value Ref Range   Glucose-Capillary 168 (H) 70 - 99 mg/dL    Comment: Glucose reference range applies only to samples taken after fasting for at least 8 hours.  CBC     Status: Abnormal   Collection Time: 05/01/23 12:19 AM  Result Value Ref Range   WBC 9.1 4.0 - 10.5 K/uL   RBC 2.80 (L) 3.87 - 5.11 MIL/uL   Hemoglobin 7.5 (L) 12.0 - 15.0 g/dL   HCT 29.5 (L) 62.1 - 30.8 %   MCV 91.1 80.0 - 100.0 fL   MCH 26.8 26.0 - 34.0 pg   MCHC 29.4 (L) 30.0 - 36.0 g/dL   RDW 65.7 (H) 84.6 - 96.2 %   Platelets 464 (H) 150 - 400 K/uL   nRBC 0.0 0.0 - 0.2 %    Comment: Performed at [facility], [address] [place], [place] [address]  Glucose, capillary     Status: Abnormal   Collection Time: 05/01/23  7:38 AM  Result Value Ref Range   Glucose-Capillary 114 (H) 70 - 99 mg/dL    Comment: Glucose reference range applies only to samples taken after fasting for at least 8 hours.  Glucose, capillary     Status: Abnormal   Collection Time: 05/01/23 11:51 AM  Result Value Ref Range   Glucose-Capillary 141 (H) 70 - 99 mg/dL    Comment: Glucose reference range applies only to samples taken after fasting for at least 8 hours.  Protime-INR     Status: Abnormal   Collection Time: 05/01/23 12:35 PM  Result Value Ref Range   Prothrombin Time 22.2 (H) 11.4 - 15.2 seconds   INR 1.9 (H) 0.8 - 1.2    Comment: (NOTE) INR goal varies based on device and disease states. Performed at [facility], [address] [place], [place] [address]   APTT     Status: Abnormal   Collection Time: 05/01/23  12:35 PM  Result Value Ref Range   aPTT 39 (H) 24 - 36 seconds    Comment:        IF BASELINE aPTT IS ELEVATED, SUGGEST PATIENT RISK ASSESSMENT BE USED TO DETERMINE APPROPRIATE ANTICOAGULANT THERAPY. Performed at [facility], [address], [place], [place] [address]   Glucose, capillary     Status: Abnormal   Collection Time: 05/01/23  4:40 PM  Result Value Ref Range   Glucose-Capillary 126 (H) 70 - 99 mg/dL    Comment: Glucose reference range applies only to samples taken after fasting for at least 8 hours.  Prepare RBC (crossmatch)     Status: None   Collection Time: 05/01/23  5:06 PM  Result Value Ref Range   Order Confirmation      ORDER PROCESSED BY BLOOD BANK Performed at [facility], [address], [place], [place] [address]   Prepare fresh frozen plasma     Status: None (Preliminary result)   Collection Time: 05/01/23  5:06 PM  Result Value Ref Range   Unit Number [id number]    Blood Component Type THW PLS APHR    Unit division A0    Status of Unit ISSUED    Transfusion Status      OK TO TRANSFUSE Performed at [facility], [address], [place], [place] [address]   Type and screen [facility]     Status: None (Preliminary result)   Collection Time: 05/01/23  6:10 PM  Result Value Ref Range   ABO/RH(D) A NEG    Antibody Screen NEG    Sample Expiration 05/04/2023,2359    Unit Number [id number]    Blood Component Type RED CELLS,LR    Unit division 00    Status of Unit ISSUED    Transfusion Status OK TO TRANSFUSE    Crossmatch Result      Compatible Performed at [facility], [address], [place], [place] [address]   Glucose, capillary     Status: Abnormal   Collection Time: 05/01/23  7:37 PM  Result Value Ref Range   Glucose-Capillary 117 (H) 70 - 99 mg/dL    Comment: Glucose reference range applies only to samples taken after fasting for at least 8 hours.  CBC     Status: Abnormal   Collection Time:  05/02/23  3:55 AM  Result Value Ref Range   WBC 9.3 4.0 - 10.5 K/uL   RBC 2.96 (L) 3.87 - 5.11 MIL/uL   Hemoglobin 8.1 (L) 12.0 - 15.0 g/dL   HCT 66.4 (L) 40.3 - 47.4 %   MCV 89.5 80.0 - 100.0 fL   MCH 27.4 26.0 - 34.0 pg   MCHC 30.6 30.0 - 36.0 g/dL   RDW 25.9 (H) 56.3 - 87.5 %   Platelets 462 (H) 150 - 400 K/uL   nRBC 0.0 0.0 - 0.2 %    Comment: Performed at [facility], [address], [place], [place] [address]  Glucose, capillary     Status: Abnormal   Collection Time: 05/02/23  7:31 AM  Result Value Ref Range   Glucose-Capillary 112 (H) 70 - 99 mg/dL    Comment: Glucose reference range applies only to samples taken after fasting for at least 8 hours.   *Note: Due to a large number  of results and/or encounters for the requested time period, some results have not been displayed. A complete set of results can be found in Results Review.   No results found.    Blood pressure (!) 147/68, pulse 60, temperature 98.3 F (36.8 C), temperature source Oral, resp. rate 18, height 5' 4.5" (1.638 m), weight 102.5 kg, SpO2 100%.  Medical Problem List and Plan: 1. Functional deficits secondary to right trimalleolar ankle fx after syncopal episode.   -patient may shower if RLE is covered  -ELOS/Goals: 8-13 days,  mod I to supervision goals with PT and OT  2.  Antithrombotics: -DVT/anticoagulation:  Mechanical:  Antiembolism stockings, knee (TED hose) Bilateral lower extremities (chemo prophy d/c due to epistaxis, anemia)  -antiplatelet therapy: none  3. Pain Management:  -continue Tylenol 1000 mg q 8 hours  -continue oxycodone 10 mg q 4 hours prn  4. Chronic depression/Insomnia: LCSW to evaluate and provide emotional support  -continue Celexa 20 mg daily  -trazodone 50 mg q HS prn sleep  -pt was on at bedtime remeron and xanax prn at home (not resumed)  -antipsychotic agents: n/a  5. Neuropsych/cognition: This patient is capable of making decisions on her own behalf.  6.  Skin/Wound Care: Routine skin care checks   7. 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 Pt is transf[facility] almost weekly as an outpt at [facility]  -follow-up CBC  -goal to keep hgb >8  14: Intermittent epistaxis due to #11: - Afrin, other measures as needed  -consider ENT consultation for hemostasis   15: Right ankle fracture s/p ORIF 1/19, Dr. Hulda Humphrey (has nylon sutures and short leg splint with posterior slab and stirrups)  -NWB for 8-10 weeks  -follow-up ortho in 2-3 weeks post-op  16: Tobacco use: cessation counseling  17: Code status: DNR-limited     19: DM-2: A1c = 5.0% October 2024 (glucoses ~100-120s on regular diet)  -home metformin 500 mg BID not restarted  -d/c CBGs  20: Thrombocytosis: follow-up CBC  21: New onset nausea:  -pt reports that it came on after FFP yesterday afternoon.  -had large bm this morning with no improvement in symptoms   -continue with prn tigan, observe for now, belly is benign  -I don't see any other obvious causes the moment.    @Sandra  Forestine Na, PA-C 05/02/2023

## 2023-05-02 NOTE — Progress Notes (Signed)
[name], MD  Physician Physical Medicine and Rehabilitation   PMR Pre-admission    Signed   Date of Service: 05/02/2023 11:25 AM  Related encounter: ED to Hosp-Admission (Current) from 04/13/2023 in [facility]   Signed     Expand All Collapse All  PMR Admission Coordinator Pre-Admission Assessment   Patient: Peggy House is an 63 y.o., female MRN: [id number] DOB: 1960-06-05 Height: 5' 4.5" (163.8 cm) Weight: 102.5 kg   Insurance Information HMO:     PPO:      PCP:      IPA:      80/20:      OTHER:  PRIMARY: [employer]      Policy#: [id number]      Subscriber:  CM Name: faxed approval      Phone#: n/a     Fax#: [phone] (confirmed) Pre-Cert#: [id number] auth for CIR via faxed approval with updates due on 1/22, they have EPIC access  Employer:  Benefits:  Phone #: [phone]     Name:  Eff. Date: 05/16/22     Deduct: $0      Out of Pocket Max: $0      Life Max:  CIR: 100%      SNF:  Outpatient:      Co-Pay:  Home Health:       Co-Pay:  DME:      Co-Pay:  Providers:  SECONDARY:       Policy#:      Phone#:    Artist:       Phone#:    The Data processing manager" for patients in Inpatient Rehabilitation Facilities with attached "Privacy Act Statement-Health Care Records" was provided and verbally reviewed with: Patient   Emergency Contact Information Contact Information       Name Relation Home Work Mobile    [place] Son [phone]   [phone]    Gerda Diss     [phone]    Juanda Chance     [phone]         Other Contacts   None on File        Current Medical History  Patient Admitting Diagnosis: fall with trimalleolar fracture s/p ORIF   History of Present Illness: Pt is a 63 y/o female with PMH of hereditary hemorrhagic telangiectasia (gets weekly OP blood transfusions), chronic iron deficiency anemia, DM, HTN, MDD, and obesity who admitted to [facility] on 04/13/23 following a syncopal episode and fall.  Per EMS report fall was precipitated by epistaxis and LOC.  In ED, WBC 14.5, Hgb 5.1, K 3.2, Cr 1.18, albumin 2.7, SBP 98.  C/O severe RLE pain and xray revealed R ankle fracture dislocation.  Orthopedics was consulted and recommended operative fixation, which pt underwent per Dr. Hulda Humphrey on 1/9.  Cardiac workup for syncope, echo showed grade 2 diastolic dysfunction.  Hospital course ABLA and ongoing transfusions.  Therapy evaluations completed and pt was recommended for CIR.    Patient's medical record from [facility] has been reviewed by the rehabilitation admission coordinator and physician.   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 Yes   Family History   family history includes Bleeding Disorder in her mother, sister, and son; Cancer in her mother; Colon cancer in her maternal grandfather; Crohn's disease in her maternal grandfather; Diabetes in her mother and sister; Diabetes type II in her sister; Kidney disease in her mother and son; Ovarian cancer in her mother; Stomach cancer in her maternal grandmother.   Current Medications  Current Medications    Current Facility-Administered Medications:    acetaminophen (TYLENOL) tablet 1,000 mg, 1,000 mg, Oral, Q8H, Patel, Amar, DO, 1,000 mg at 05/02/23 0619   albuterol (PROVENTIL) (2.5 MG/3ML) 0.083% nebulizer solution 2.5 mg, 2.5 mg, Inhalation, Q6H PRN, Allena Katz, Amar, DO   aminocaproic acid (AMICAR) tablet 1 g, 1 g, Oral, Q12H,  Juberg, Christopher, DO, 1 g at 05/02/23 1036   amLODipine (NORVASC) tablet 10 mg, 10 mg, Oral, Daily, Juberg, Christopher, DO, 10 mg at 05/02/23 1036   Chlorhexidine Gluconate Cloth 2 % PADS 6 each, 6 each, Topical, Daily, Miguel Aschoff, MD, 6 each at 05/01/23 [phone]   citalopram (CELEXA) tablet 20 mg, 20 mg, Oral, Daily, Allena Katz, Amar, DO, 20 mg at 05/02/23 1035   dextromethorphan-guaiFENesin (MUCINEX DM) 30-600 MG per 12 hr tablet 2 tablet, 2 tablet, Oral, BID PRN, Dickie La, MD   feeding supplement (BOOST / RESOURCE BREEZE) liquid 1 Container, 1 Container, Oral, TID BM, Mercie Eon, MD, 1 Container at 05/02/23 1037   lisinopril (ZESTRIL) tablet 20 mg, 20 mg, Oral, Daily, Rana Snare, DO, 20 mg at 05/02/23 1035   oxyCODONE (Oxy IR/ROXICODONE) immediate release tablet 10 mg, 10 mg, Oral, Q4H PRN, Dickie La, MD, 10 mg at 05/02/23 0322   oxymetazoline (AFRIN) 0.05 % nasal spray 1 spray, 1 spray, Each Nare, BID PRN, Laretta Bolster, MD, 1 spray at 05/01/23 1305   pantoprazole (PROTONIX) EC tablet 40 mg, 40 mg, Oral, BID, Patel, Amar, DO, 40 mg at 05/02/23 1035   polyethylene glycol (MIRALAX / GLYCOLAX) packet 17 g, 17 g, Oral, BID, Juberg, Christopher, DO, 17 g at 05/01/23 6440   senna-docusate (Senokot-S) tablet 1 tablet, 1 tablet, Oral, BID, Juberg, Christopher, DO, 1 tablet at 04/30/23 2132   sodium chloride flush (NS) 0.9 % injection 10-40 mL, 10-40 mL, Intracatheter, Q12H, Miguel Aschoff, MD, 10 mL at 05/02/23 1037   traZODone (DESYREL) tablet 50 mg, 50 mg, Oral, QHS PRN, Modena Slater, DO, 50 mg at 05/01/23 2035   trimethobenzamide (TIGAN) injection 200 mg, 200 mg, Intramuscular, Q8H PRN, Rana Snare, DO, 200 mg at 05/02/23 1055   Facility-Administered Medications Ordered in Other Encounters:    diphenhydrAMINE (BENADRYL) 25 mg capsule, , , ,    diphenhydrAMINE (BENADRYL) 25 mg capsule, , , ,    phenylephrine (NEO-SYNEPHRINE) 1 % nasal drops 2 drop, 2 drop, Left Nare, Once,  Malachy Mood, MD     Patients Current Diet:  Diet Order                  Diet regular Room service appropriate? Yes; Fluid consistency: Thin  Diet effective now                         Precautions / Restrictions  Precautions Precautions: Fall Restrictions Weight Bearing Restrictions Per Provider Order: Yes RLE Weight Bearing Per Provider Order: Non weight bearing    Has the patient had 2 or more falls or a fall with injury in the past year? Yes   Prior Activity Level Limited Community (1-2x/wk): indpendent with intermittent RW at baseline, sponge bathes, some assist with dressing, not driving   Prior Functional Level Self Care: Did the patient need help bathing, dressing, using the toilet or eating? Needed some help   Indoor Mobility: Did the patient need assistance with walking from room to room (with or without device)? Independent   Stairs: Did the patient need assistance with internal or external stairs (with or without device)? Needed some help   Functional Cognition: Did the patient need help planning regular tasks such as shopping or remembering to take medications? Independent   Patient Information Are you of Hispanic, Latino/a,or Spanish origin?: E. Yes, another Hispanic, Latino, or Spanish origin What is your race?: A. White, B. Black or African American, C. American [place] or [place] Native Do you need or want an interpreter to communicate with a doctor or health care staff?: 0. No   Patient's Response To:  Health Literacy and Transportation Is the patient able to respond to health literacy and transportation needs?: Yes Health Literacy - How often do you need to have someone help you when you read instructions, pamphlets, or other written material from your doctor or pharmacy?: Never In the past 12 months, has lack of transportation kept you from medical appointments or from getting medications?: Yes In the past 12 months, has lack of transportation kept you from  meetings, work, or from getting things needed for daily living?: Yes   Home Assistive Devices / Equipment Home Equipment: Agricultural consultant (2 wheels), BSC/3in1   Prior Device Use: Indicate devices/aids used by the patient prior to current illness, exacerbation or injury? Walker   Current Functional Level Cognition   Overall Cognitive Status: Within Functional Limits for tasks assessed Orientation Level: Oriented X4    Extremity Assessment (includes Sensation/Coordination)   Upper Extremity Assessment: Generalized weakness, Right hand dominant LUE Deficits / Details: hx of carpal tunnel surgery 2 years ago, still numb in palm, drops items often. Pt has difficulty with FM/opposition LUE Sensation: decreased light touch LUE Coordination: decreased fine motor  Lower Extremity Assessment: Defer to PT evaluation RLE Deficits / Details: pt reports tingling in RLE RLE: Unable to fully assess due to pain     ADLs   Overall ADL's : Needs assistance/impaired Eating/Feeding: Set up, Sitting Grooming: Wash/dry hands, Wash/dry face, Oral care, Set up, Sitting Upper Body Bathing: Set up, Sitting Lower Body Bathing: Moderate assistance, Maximal assistance, Sitting/lateral leans Upper Body Dressing : Set up, Sitting Lower Body Dressing: Maximal assistance, Total assistance, Sitting/lateral leans Toilet Transfer: Minimal assistance, BSC/3in1 (incremental scoot transfer) Toilet Transfer Details (indicate cue type and reason): simulated with scoot transfer Toileting- Clothing Manipulation and Hygiene: Moderate assistance, Sitting/lateral lean Toileting - Clothing Manipulation Details (indicate cue type and reason): for thoroughness Functional mobility during ADLs: Moderate assistance, Rolling walker (2 wheels) General ADL Comments: Patient focusing on OOB activity and functional independence with ADLs, patient is close to being able to complete an incremental scoot transfer at mod I level, though  continues to require light min A for RLE management and cues for hand placement and safety. Patient is a mod A - total A for lower body ADL management. Given patient's ability to progress, her previous level  of independence, and her ability to have 24/7 support, OT recommending intensive therapy > 3 hours in order to return to prior level of function.     Mobility   Overal bed mobility: Needs Assistance Bed Mobility: Supine to Sit Rolling: Supervision Sidelying to sit: Used rails, HOB elevated, Contact guard assist Supine to sit: Used rails, Min assist, HOB elevated Sit to supine: Min assist, HOB elevated, Used rails General bed mobility comments: Pt in recliner at beginning and end of session     Transfers   Overall transfer level: Needs assistance Equipment used: Rolling walker (2 wheels), None Transfers: Bed to chair/wheelchair/BSC, Sit to/from Stand Sit to Stand: +2 safety/equipment, Mod assist Bed to/from chair/wheelchair/BSC transfer type:: Lateral/scoot transfer, Stand pivot Stand pivot transfers: Min assist  Lateral/Scoot Transfers: Contact guard assist General transfer comment: Lateral scoot from recliner to bed with CGA for safety and cues for technique and RLE NWB. Increased time required due to fatigue. Cues for improved LLE support for power up to scoot due to pt relying heavily on momentum and only scooting minimally with pt able to improve. STS from EOB and BSC with cues for hand placement and up to mod A for power up. Cues for RLE NWB with pt able to maintain. Pt able to stand pivot to [facility] with min A for RLE NWB and balance and dense cues for technique to pivot. Pt demonstrates difficulty increasing BUE support to pivot on LLE with tendency to hop on LLE, but is able to improve with cues and practice.     Ambulation / Gait / Stairs / Wheelchair Mobility   Ambulation/Gait General Gait Details: unable     Posture / Balance Dynamic Sitting Balance Sitting balance - Comments:  EOB no assist. maintains NWB on Rt Balance Overall balance assessment: Needs assistance Sitting-balance support: No upper extremity supported, Feet supported Sitting balance-Leahy Scale: Good Sitting balance - Comments: EOB no assist. maintains NWB on Rt Standing balance support: Bilateral upper extremity supported, Reliant on assistive device for balance, During functional activity Standing balance-Leahy Scale: Poor Standing balance comment: with RW support     Special needs/care consideration Skin surgical incision to RLE, Diabetic management yes, and Special service needs frequent tranfusions/heme onc following    Previous Home Environment (from acute therapy documentation) Living Arrangements: Children (Son) Available Help at Discharge: Family, Available PRN/intermittently (son works but sister can stay as long as needed) Type of Home: Apartment Home Layout: One level Home Access: Level entry Bathroom Shower/Tub: Sponge bathes at baseline, Tub/shower unit Bathroom Toilet: Standard (BSC on top) Bathroom Accessibility: Yes Home Care Services: No Additional Comments: Pt lives with son who works 3pm-3am   Discharge Living Setting Plans for Discharge Living Setting: Patient's home, Lives with (comment) (son) Type of Home at Discharge: Apartment Discharge Home Layout: One level Discharge Home Access: Level entry Discharge Bathroom Shower/Tub:  (sponge bathes at baseline) Discharge Bathroom Toilet: Standard (BSC over toilet to raise it) Discharge Bathroom Accessibility: Yes How Accessible: Accessible via walker Does the patient have any problems obtaining your medications?: No   Social/Family/Support Systems Anticipated Caregiver: son Clide Cliff) and sister Rinaldo Cloud) Anticipated Caregiver's Contact Information: Clide Cliff [phone] Ability/Limitations of Caregiver: Clide Cliff works, sister Rinaldo Cloud to come stay with pt while he works Engineer, structural Availability: 24/7 Discharge Plan Discussed with  Primary Caregiver: Yes Is Caregiver In Agreement with Plan?: Yes Does Caregiver/Family have Issues with Lodging/Transportation while Pt is in Rehab?: No   Goals Patient/Family Goal for Rehab: PT/OT supervision to mod I, SLP  n/a Expected length of stay: 7-10 Additional Information: Discharge plan: home at mod I w/c level, has help from son during non-work hours and sister while son is at work Pt/Family Agrees to Admission and willing to participate: Yes Program Orientation Provided & Reviewed with Pt/Caregiver Including Roles  & Responsibilities: Yes   Decrease burden of Care through IP rehab admission: n/a   Possible need for SNF placement upon discharge:  Not anticipated.  Expected supervision to mod I level at discharge, likely primary w/c user, home with son and sister   Patient Condition: I have reviewed medical records from [facility], spoken with CM, and patient. I met with patient at the bedside for inpatient rehabilitation assessment.  Patient will benefit from ongoing PT and OT, can actively participate in 3 hours of therapy a day 5 days of the week, and can make measurable gains during the admission.  Patient will also benefit from the coordinated team approach during an Inpatient Acute Rehabilitation admission.  The patient will receive intensive therapy as well as Rehabilitation physician, nursing, social worker, and care management interventions.  Due to safety, skin/wound care, disease management, medication administration, pain management, and patient education the patient requires 24 hour a day rehabilitation nursing.  The patient is currently min assist with mobility and basic ADLs.  Discharge setting and therapy post discharge at home with home health is anticipated.  Patient has agreed to participate in the Acute Inpatient Rehabilitation Program and will admit today.   Preadmission Screen Completed By:  Stephania Fragmin, PT, DPT  05/02/2023 11:29  AM ______________________________________________________________________   Discussed status with Dr. Riley Kill on 05/02/23  at 11:29 AM  and received approval for admission today.   Admission Coordinator:  Stephania Fragmin, PT, time 11:29 AM Dorna Bloom 05/02/23       MD Signature: [name], MD, [facility] Physical Medicine & Rehabilitation Medical Director Rehabilitation Services 05/02/2023           Revision History

## 2023-05-03 ENCOUNTER — Other Ambulatory Visit: Payer: Self-pay

## 2023-05-03 ENCOUNTER — Encounter (HOSPITAL_COMMUNITY): Payer: Self-pay | Admitting: Physical Medicine and Rehabilitation

## 2023-05-03 DIAGNOSIS — S82891S Other fracture of right lower leg, sequela: Secondary | ICD-10-CM | POA: Diagnosis not present

## 2023-05-03 MED ORDER — AMLODIPINE BESYLATE 5 MG PO TABS
5.0000 mg | ORAL_TABLET | Freq: Every day | ORAL | Status: DC
  Administered 2023-05-04: 5 mg via ORAL
  Filled 2023-05-03: qty 1

## 2023-05-03 MED ORDER — MAGNESIUM GLUCONATE 500 MG PO TABS
250.0000 mg | ORAL_TABLET | Freq: Every day | ORAL | Status: DC
  Administered 2023-05-03: 250 mg via ORAL
  Filled 2023-05-03: qty 1

## 2023-05-03 NOTE — Evaluation (Signed)
Physical Therapy Assessment and Plan  Patient Details  Name: Peggy House MRN: [id number] Date of Birth: [birth year]  PT Diagnosis: Abnormality of gait, Difficulty walking, Muscle weakness, and Pain in R ankle Rehab Potential: Good ELOS: 1.5 - 2 weeks   Today's Date: 05/03/2023 PT Individual Time: 4098-1191 PT Individual Time Calculation (min): 70 min    Hospital Problem: Principal Problem:   Ankle fracture   Past Medical History:  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   Past Surgical History:  Past Surgical History:  Procedure Laterality Date   ABDOMINAL HYSTERECTOMY     CARPAL TUNNEL RELEASE  05/13/2011   Procedure: CARPAL TUNNEL RELEASE;  Surgeon: Mable Paris, MD;  Location: [facility];  Service: Orthopedics;  Laterality: Left;   COLONOSCOPY N/A 03/02/2020   Procedure: COLONOSCOPY;  Surgeon: Bernette Redbird, MD;  Location: [facility] ENDOSCOPY;  Service: Endoscopy;  Laterality: N/A;   COLONOSCOPY WITH PROPOFOL N/A 04/28/2014   Procedure: COLONOSCOPY WITH PROPOFOL;  Surgeon: Florencia Reasons, MD;  Location: [facility] ENDOSCOPY;  Service: Endoscopy;  Laterality: N/A;   DG TOES*L*  2/10   rt   DILATION AND CURETTAGE OF UTERUS     ENTEROSCOPY N/A 10/17/2017   Procedure: ENTEROSCOPY;  Surgeon: Kathi Der, MD;  Location: [facility] ENDOSCOPY;  Service: Gastroenterology;  Laterality: N/A;   ESOPHAGOGASTRODUODENOSCOPY N/A 04/10/2014   Procedure: ESOPHAGOGASTRODUODENOSCOPY (EGD);  Surgeon: Shirley Friar, MD;  Location: [facility] ENDOSCOPY;  Service: Endoscopy;  Laterality: N/A;   ESOPHAGOGASTRODUODENOSCOPY N/A 05/10/2017    Procedure: ESOPHAGOGASTRODUODENOSCOPY (EGD);  Surgeon: Bernette Redbird, MD;  Location: [facility] ENDOSCOPY;  Service: Endoscopy;  Laterality: N/A;   ESOPHAGOGASTRODUODENOSCOPY N/A 09/22/2017   Procedure: ESOPHAGOGASTRODUODENOSCOPY (EGD);  Surgeon: Vida Rigger, MD;  Location: [facility] ENDOSCOPY;  Service: Endoscopy;  Laterality: N/A;  bedside   ESOPHAGOGASTRODUODENOSCOPY N/A 03/02/2020   Procedure: ESOPHAGOGASTRODUODENOSCOPY (EGD);  Surgeon: Bernette Redbird, MD;  Location: [facility];  Service: Endoscopy;  Laterality: N/A;   ESOPHAGOGASTRODUODENOSCOPY N/A 11/03/2020   Procedure: ESOPHAGOGASTRODUODENOSCOPY (EGD);  Surgeon: Charlott Rakes, MD;  Location: [facility] ENDOSCOPY;  Service: Endoscopy;  Laterality: N/A;   ESOPHAGOGASTRODUODENOSCOPY N/A 04/12/2022   Procedure: ESOPHAGOGASTRODUODENOSCOPY (EGD);  Surgeon: Vida Rigger, MD;  Location: [facility] ENDOSCOPY;  Service: Gastroenterology;  Laterality: N/A;   ESOPHAGOGASTRODUODENOSCOPY N/A 05/27/2022   Procedure: ESOPHAGOGASTRODUODENOSCOPY (EGD);  Surgeon: Willis Modena, MD;  Location: [facility] ENDOSCOPY;  Service: Gastroenterology;  Laterality: N/A;   ESOPHAGOGASTRODUODENOSCOPY N/A 09/27/2022   Procedure: ESOPHAGOGASTRODUODENOSCOPY (EGD);  Surgeon: Lynann Bologna, DO;  Location: [facility] ENDOSCOPY;  Service: Gastroenterology;  Laterality: N/A;   ESOPHAGOGASTRODUODENOSCOPY (EGD) WITH PROPOFOL N/A 04/27/2014   Procedure: ESOPHAGOGASTRODUODENOSCOPY (EGD) WITH PROPOFOL;  Surgeon: Florencia Reasons, MD;  Location: [facility] ENDOSCOPY;  Service: Endoscopy;  Laterality: N/A;  possible apc   ESOPHAGOGASTRODUODENOSCOPY (EGD) WITH PROPOFOL N/A 09/30/2017   Procedure: ESOPHAGOGASTRODUODENOSCOPY (EGD) WITH PROPOFOL;  Surgeon: Kerin Salen, MD;  Location: [facility] ENDOSCOPY;  Service: Gastroenterology;  Laterality: N/A;   ESOPHAGOGASTRODUODENOSCOPY (EGD) WITH PROPOFOL N/A 10/01/2017   Procedure: ESOPHAGOGASTRODUODENOSCOPY (EGD) WITH PROPOFOL;  Surgeon: Kerin Salen, MD;  Location: [facility] ENDOSCOPY;  Service:  Gastroenterology;  Laterality: N/A;   ESOPHAGOGASTRODUODENOSCOPY (EGD) WITH  PROPOFOL N/A 10/08/2017   Procedure: ESOPHAGOGASTRODUODENOSCOPY (EGD) WITH PROPOFOL;  Surgeon: Kathi Der, MD;  Location: [facility] ENDOSCOPY;  Service: Gastroenterology;  Laterality: N/A;   ESOPHAGOGASTRODUODENOSCOPY (EGD) WITH PROPOFOL N/A 10/17/2017   Procedure: ESOPHAGOGASTRODUODENOSCOPY (EGD) WITH PROPOFOL;  Surgeon: Kathi Der, MD;  Location: [facility] ENDOSCOPY;  Service: Gastroenterology;  Laterality: N/A;   ESOPHAGOGASTRODUODENOSCOPY (EGD) WITH PROPOFOL N/A 10/19/2017   Procedure: ESOPHAGOGASTRODUODENOSCOPY (EGD) WITH PROPOFOL;  Surgeon: Kathi Der, MD;  Location[facility]OS[facility];  Service: Gastroenterology;  Laterality: N/A;   ESOPHAGOGASTRODUODENOSCOPY (EGD) WITH PROPOFOL N/A 12/04/2018   Procedure: ESOPHAGOGASTRODUODENOSCOPY (EGD) WITH PROPOFOL;  Surgeon: Charlott Rakes, MD;  Location: [facility] ENDOSCOPY;  Service: Endoscopy;  Laterality: N/A;   GIVENS CAPSULE STUDY N/A 10/02/2017   Procedure: GIVENS CAPSULE STUDY;  Surgeon: Kerin Salen, MD;  Location: [facility] ENDOSCOPY;  Service: Gastroenterology;  Laterality: N/A;   GIVENS CAPSULE STUDY N/A 10/08/2017   Procedure: GIVENS CAPSULE STUDY;  Surgeon: Kathi Der, MD;  Location: [facility] ENDOSCOPY;  Service: Gastroenterology;  Laterality: N/A;  endoscopic placement of capsule   GIVENS CAPSULE STUDY N/A 03/02/2020   Procedure: GIVENS CAPSULE STUDY;  Surgeon: Bernette Redbird, MD;  Location: [facility] ENDOSCOPY;  Service: Endoscopy;  Laterality: N/A;   HEMOSTASIS CLIP PLACEMENT  12/04/2018   Procedure: HEMOSTASIS CLIP PLACEMENT;  Surgeon: Charlott Rakes, MD;  Location: [facility] ENDOSCOPY;  Service: Endoscopy;;   HEMOSTASIS CLIP PLACEMENT  05/27/2022   Procedure: HEMOSTASIS CLIP PLACEMENT;  Surgeon: Willis Modena, MD;  Location: [facility] ENDOSCOPY;  Service: Gastroenterology;;   HEMOSTASIS CLIP PLACEMENT  09/27/2022   Procedure: HEMOSTASIS CLIP PLACEMENT;  Surgeon: Lynann Bologna, DO;   Location: [facility] ENDOSCOPY;  Service: Gastroenterology;;   HEMOSTASIS CONTROL  05/27/2022   Procedure: HEMOSTASIS CONTROL;  Surgeon: Willis Modena, MD;  Location: [facility] ENDOSCOPY;  Service: Gastroenterology;;   HOT HEMOSTASIS N/A 04/27/2014   Procedure: HOT HEMOSTASIS (ARGON PLASMA COAGULATION/BICAP);  Surgeon: Florencia Reasons, MD;  Location: [facility] ENDOSCOPY;  Service: Endoscopy;  Laterality: N/A;   HOT HEMOSTASIS N/A 09/30/2017   Procedure: HOT HEMOSTASIS (ARGON PLASMA COAGULATION/BICAP);  Surgeon: Kerin Salen, MD;  Location: [facility] ENDOSCOPY;  Service: Gastroenterology;  Laterality: N/A;   HOT HEMOSTASIS N/A 10/01/2017   Procedure: HOT HEMOSTASIS (ARGON PLASMA COAGULATION/BICAP);  Surgeon: Kerin Salen, MD;  Location: [facility] ENDOSCOPY;  Service: Gastroenterology;  Laterality: N/A;   HOT HEMOSTASIS N/A 10/17/2017   Procedure: HOT HEMOSTASIS (ARGON PLASMA COAGULATION/BICAP);  Surgeon: Kathi Der, MD;  Location: [facility] ENDOSCOPY;  Service: Gastroenterology;  Laterality: N/A;   HOT HEMOSTASIS N/A 10/19/2017   Procedure: HOT HEMOSTASIS (ARGON PLASMA COAGULATION/BICAP);  Surgeon: Kathi Der, MD;  Location: [facility] ENDOSCOPY;  Service: Gastroenterology;  Laterality: N/A;   HOT HEMOSTASIS N/A 03/02/2020   Procedure: HOT HEMOSTASIS (ARGON PLASMA COAGULATION/BICAP);  Surgeon: Bernette Redbird, MD;  Location: [facility]COPY;  Service: Endoscopy;  Laterality: N/A;   HOT HEMOSTASIS N/A 04/12/2022   Procedure: HOT HEMOSTASIS (ARGON PLASMA COAGULATION/BICAP);  Surgeon: Vida Rigger, MD;  Location: [facility] ENDOSCOPY;  Service: Gastroenterology;  Laterality: N/A;   IR IMAGING GUIDED PORT INSERTION  07/08/2018   L shoulder Surgery  2011   ORIF ANKLE FRACTURE Right 04/24/2023   Procedure: OPEN REDUCTION INTERNAL FIXATION (ORIF) ANKLE FRACTURE;  Surgeon: Luci Bank, MD;  Location: [facility] OR;  Service: Orthopedics;  Laterality: Right;   POLYPECTOMY  03/02/2020   Procedure: POLYPECTOMY;  Surgeon: Bernette Redbird, MD;  Location: [facility] ENDOSCOPY;   Service: Endoscopy;;   Susa Day  11/03/2020   Procedure: Susa Day;  Surgeon: Charlott Rakes, MD;  Location: [facility] ENDOSCOPY;  Service: Endoscopy;;  SUBMUCOSAL INJECTION  09/22/2017   Procedure: SUBMUCOSAL INJECTION;  Surgeon: Vida Rigger, MD;  Location: [facility] ENDOSCOPY;  Service: Endoscopy;;   SUBMUCOSAL INJECTION  12/04/2018   Procedure: SUBMUCOSAL INJECTION;  Surgeon: Charlott Rakes, MD;  Location: [facility] ENDOSCOPY;  Service: Endoscopy;;    Assessment & Plan Clinical Impression: Patient is a 63 year old female with a history of HTT presented to the ED on 04/13/2023 reporting syncopal episode and fall resulting in right ankle fracture.  She exhibited profound anemia on lab work with a hemoglobin 5.1. She underwent ORIF by Dr. Hulda Humphrey on 1/09 and she is NWB for 6-8 weeks. She continued to have nose bleeds, per primary team, the risk of chemoppx for DVT outweighs the benefits. Xarelto discontinued and SCDs placed for DVT ppx. Transfused additional unit of pRBC for a total of 10 units since admission with one unit of FFP. Follows with Dr. Malachy Mood. Cardiac workup for syncope, echo showed grade 2 diastolic dysfunction.  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 s/p IR embolization of gastric artery branch vessels on 12/10/17, cauterization of stomach for severe GI bleed in 11/2018, and epinephrine injection for bleeding ulcer on 11/03/20. She has required frequent blood transfusions, IV iron and bevacizumab. Has indwelling right internal jugular venous access device. PMH significant for DM-2, hypertension, depression. Pt able to stand pivot to [facility] with min A for RLE NWB and balance and dense cues for technique to pivot. Pt demonstrates difficulty increasing BUE support to pivot on LLE with tendency to hop on LLE, but is able to improve with cues and practice. The patient requires inpatient medicine and rehabilitation evaluations and services for ongoing dysfunction secondary to  debility.Patient transferred to CIR on 05/02/2023 .   Patient currently requires min with mobility secondary to muscle weakness and muscle joint tightness, decreased cardiorespiratoy endurance, and decreased standing balance and decreased balance strategies.  Prior to hospitalization, patient was independent  with mobility and lived with Son, Family (Son's girlfriend & 28 y.o grand daughter) in a Apartment home.  Home access is  Level entry.  Patient will benefit from skilled PT intervention to maximize safe functional mobility, minimize fall risk, and decrease caregiver burden for planned discharge home with intermittent assist.  Anticipate patient will benefit from follow up [facility] at discharge.  PT - End of Session Activity Tolerance: Tolerates < 10 min activity, no significant change in vital signs Endurance Deficit: Yes Endurance Deficit Description: Fatigues quickly; requires seated rest breaks for recovery PT Assessment Rehab Potential (ACUTE/IP ONLY): Good PT Barriers to Discharge: Decreased caregiver support;Home environment access/layout;Insurance for SNF coverage;Weight;Weight bearing restrictions PT Patient demonstrates impairments in the following area(s): Balance;Endurance;Motor;Pain;Safety PT Transfers Functional Problem(s): Bed Mobility;Bed to Chair;Car PT Locomotion Functional Problem(s): Ambulation;Wheelchair Mobility;Stairs PT Plan PT Intensity: Minimum of 1-2 x/day ,45 to 90 minutes PT Frequency: 5 out of 7 days PT Duration Estimated Length of Stay: 1.5 - 2 weeks PT Treatment/Interventions: Ambulation/gait training;Discharge planning;Cognitive remediation/compensation;DME/adaptive equipment instruction;Functional mobility training;Pain management;Psychosocial support;Splinting/orthotics;Therapeutic Activities;UE/LE Strength taining/ROM;Visual/perceptual remediation/compensation;Wheelchair Designer, jewellery;Therapeutic  Exercise;Patient/family education;Neuromuscular re-education;Functional electrical stimulation;Community reintegration;Balance/vestibular training;Disease management/prevention PT Transfers Anticipated Outcome(s): CGA PT Locomotion Anticipated Outcome(s): CGA (anticipate mostly wheelchair level) PT Recommendation Recommendations for Other Services: Neuropsych consult Follow Up Recommendations: Home health PT;24 hour supervision/assistance Patient destination: Home Equipment Recommended: To be determined   PT Evaluation Precautions/Restrictions Precautions Precautions: Fall Precaution Comments: RLE NWB Restrictions Weight Bearing Restrictions Per Provider Order: Yes RLE Weight Bearing Per Provider Order: Non weight bearing  General   Vital Signs  Pain   Pain Interference Pain Interference Pain Effect on Sleep: 4. Almost constantly Pain Interference with Therapy Activities: 4. Almost constantly Pain Interference with Day-to-Day Activities: 4. Almost constantly Home Living/Prior Functioning Home Living Available Help at Discharge: Family;Available PRN/intermittently;Other (Comment) (Sister will be moving in for temporary assist) Type of Home: Apartment Home Access: Level entry Home Layout: One level Bathroom Shower/Tub: Sponge bathes at baseline;Tub/shower unit Teacher, early years/pre: Yes  Lives With: Son;Family (Son's girlfriend & 3 y.o grand daughter) Prior Function Level of Independence: Other (comment) (Ocassionally would use a RW when she was tired)  Able to Take Stairs?: Yes Driving: No Vocation: On disability Vision/Perception  Vision - History Ability to See in Adequate Light: 2 Moderately impaired Perception Perception: Within Functional Limits Praxis Praxis: WFL  Cognition Overall Cognitive Status: Within Functional Limits for tasks assessed Arousal/Alertness: Awake/alert Orientation Level: Oriented X4 Year: 2025 Month:  January Day of Week: Correct Attention: Focused;Sustained Focused Attention: Appears intact Sustained Attention: Appears intact Memory: Appears intact Awareness: Appears intact Problem Solving: Appears intact Safety/Judgment: Appears intact Sensation Sensation Light Touch: Appears Intact Hot/Cold: Appears Intact Proprioception: Appears Intact Stereognosis: Appears Intact Coordination Gross Motor Movements are Fluid and Coordinated: No Coordination and Movement Description: Limited by pain and R ankle fx Motor  Motor Motor: Other (comment) Motor - Skilled Clinical Observations: Generalized weakness and deconditioning s/p ORIF R ankle   Trunk/Postural Assessment  Cervical Assessment Cervical Assessment: Within Functional Limits Thoracic Assessment Thoracic Assessment: Within Functional Limits Lumbar Assessment Lumbar Assessment: Within Functional Limits Postural Control Postural Control: Within Functional Limits  Balance Balance Balance Assessed: Yes Static Sitting Balance Static Sitting - Balance Support: Feet supported;No upper extremity supported Static Sitting - Level of Assistance: 7: Independent Dynamic Sitting Balance Dynamic Sitting - Balance Support: Feet supported;No upper extremity supported Dynamic Sitting - Level of Assistance: 5: Stand by assistance Static Standing Balance Static Standing - Balance Support: Bilateral upper extremity supported Static Standing - Level of Assistance: 4: Min assist Dynamic Standing Balance Dynamic Standing - Balance Support: Bilateral upper extremity supported;During functional activity Dynamic Standing - Level of Assistance: 3: Mod assist Extremity Assessment      RLE Assessment RLE Assessment: Exceptions to [facility] General Strength Comments: Grossly 4/5 (unable to assess ankle 2/2 splint) LLE Assessment LLE Assessment: Exceptions to [facility] General Strength Comments: Grossly 4+/5  Care Tool Care Tool Bed Mobility Roll left  and right activity   Roll left and right assist level: Minimal Assistance - Patient > 75%    Sit to lying activity   Sit to lying assist level: Minimal Assistance - Patient > 75%    Lying to sitting on side of bed activity   Lying to sitting on side of bed assist level: the ability to move from lying on the back to sitting on the side of the bed with no back support.: Minimal Assistance - Patient > 75%     Care Tool Transfers Sit to stand transfer   Sit to stand assist level: Moderate Assistance - Patient 50 - 74%    Chair/bed transfer        Careers adviser transfer assist level: Moderate Assistance - Patient 50 - 74%      Care Tool Locomotion Ambulation          Walk 10 feet activity         Walk 50 feet with 2 turns activity Walk 50 feet with 2 turns activity  did not occur: Safety/medical concerns      Walk 150 feet activity Walk 150 feet activity did not occur: Safety/medical concerns      Walk 10 feet on uneven surfaces activity Walk 10 feet on uneven surfaces activity did not occur: Safety/medical concerns      Stairs Stair activity did not occur: Safety/medical concerns        Walk up/down 1 step activity Walk up/down 1 step or curb (drop down) activity did not occur: Safety/medical concerns      Walk up/down 4 steps activity Walk up/down 4 steps activity did not occur: Safety/medical concerns      Walk up/down 12 steps activity Walk up/down 12 steps activity did not occur: Safety/medical concerns      Pick up small objects from floor Pick up small object from the floor (from standing position) activity did not occur: Safety/medical concerns      Wheelchair Is the patient using a wheelchair?: Yes Type of Wheelchair: Manual   Wheelchair assist level: Supervision/Verbal cueing Max wheelchair distance: 150'  Wheel 50 feet with 2 turns activity   Assist Level: Supervision/Verbal cueing  Wheel 150 feet activity   Assist Level: Supervision/Verbal  cueing    Refer to Care Plan for Long Term Goals  SHORT TERM GOAL WEEK 1 PT Short Term Goal 1 (Week 1): Pt will complete bed mobility with CGA PT Short Term Goal 2 (Week 1): Pt will complete bed<>chair transfers with minA and LRAD PT Short Term Goal 3 (Week 1): Pt will hop 62ft with minA and LRAD  Recommendations for other services: Neuropsych  Skilled Therapeutic Intervention Mobility Bed Mobility Bed Mobility: Rolling Right;Rolling Left;Supine to Sit;Sit to Supine Rolling Right: Minimal Assistance - Patient > 75% Rolling Left: Minimal Assistance - Patient > 75% Supine to Sit: Minimal Assistance - Patient > 75% Sit to Supine: Minimal Assistance - Patient > 75% Transfers Transfers: Sit to Stand;Stand to Sit;Stand Pivot Transfers Sit to Stand: Minimal Assistance - Patient > 75% Stand to Sit: Minimal Assistance - Patient > 75% Stand Pivot Transfers: Minimal Assistance - Patient > 75% Stand Pivot Transfer Details: Tactile cues for initiation;Tactile cues for weight shifting;Verbal cues for technique;Verbal cues for gait pattern;Verbal cues for precautions/safety;Verbal cues for safe use of DME/AE;Tactile cues for posture Transfer (Assistive device): Rolling walker Locomotion  Gait Ambulation: Yes Gait Assistance: 2 Helpers;Minimal Assistance - Patient > 75% (w/c follow) Gait Distance (Feet): 7 Feet Assistive device: Rolling walker Gait Assistance Details: Tactile cues for initiation;Tactile cues for weight shifting;Tactile cues for placement;Verbal cues for technique;Verbal cues for gait pattern;Verbal cues for safe use of DME/AE;Verbal cues for precautions/safety Gait Gait: Yes Gait Pattern: Impaired Gait Pattern:  (hop-to gait pattern - compliant with WB restrictions on RLE) Stairs / Additional Locomotion Stairs: No Wheelchair Mobility Wheelchair Mobility: Yes Wheelchair Assistance: Doctor, general practice: Both upper extremities Wheelchair Parts  Management: Needs assistance Distance: 150'  Skilled treatment: Pt lying in bed to start - agreeable to PT evaluation. Pt pleasant and cooperative, A&Ox4 without cognitive impairments. Able to follow commands and directions well. Educated patient on [employer], safety belt precautions, scheduling, and role of PT and PT POC. Pt inquiring on grounds pass - RN made aware of her request - educated patient on general precautions with grounds pass and up to MD discretion.   Retrieved RW and manual wheelchair for patient, as well as paper clothes as she's in hospital gown. Donned pants with modA for threading and pulling in standing. Bed  mobility completed with minA, including for rolling on flat bed without grab bars and for supine<>sitting.   Completes sit<>stand transfers with modA for powering to rise from low surface and minA for stability as she transfers into wheelchair.   She was able to hop 73ft with minA and +2 assist for w/c follow for safety. Compliant with WB restrictions and demonstrates hop-to gait pattern with the RW - educated on proper shoe wear to protect LLE.  Pt ended treatment sitting in wheelchair with seat belt alarm on, all her needs met.    Discharge Criteria: Patient will be discharged from PT if patient refuses treatment 3 consecutive times without medical reason, if treatment goals not met, if there is a change in medical status, if patient makes no progress towards goals or if patient is discharged from hospital.  The above assessment, treatment plan, treatment alternatives and goals were discussed and mutually agreed upon: by patient  Orrin Brigham  PT, DPT, CSRS  05/03/2023, 9:44 AM

## 2023-05-03 NOTE — Progress Notes (Signed)
Occupational Therapy Session Note  Patient Details  Name: Peggy House MRN: [id number] Date of Birth: 04/07/1961  Today's Date: 05/03/2023 OT Individual Time: 3244-0102 OT Individual Time Calculation (min): 60 min    Short Term Goals: Week 1:  OT Short Term Goal 1 (Week 1): Patient will improve UB strength to 4/5MMT at 95% safe OT Short Term Goal 2 (Week 1): The pt will complete LB bathing and dressing at MinA at 95% safe OT Short Term Goal 3 (Week 1): The pt will complete functional t/f's to all surfaces with ModI using RW at 95% safe OT Short Term Goal 4 (Week 1): The pt will adhere to RLE precaution of NWB 100% of the time without v/c's or reminders. OT Short Term Goal 5 (Week 1): The pt will tolerate > 40 minutes of functional activity at 95% safe to improve her functional outcome.  Skilled Therapeutic Interventions/Progress Updates:  Patient seated at w/c LOF in agreement with completing UB exercise to improve functional outcome. The pt was able to complete bicep curl, shld flexion and horizontal abduction 2 sets pf 10 with rest breaks as needed. The pt required 1 rest break with each exercise. The pt went on to complete a resistive exercise using a 2lb dowel held with both hands in shld flexion and maintaining position of the dowel against resistance 5x for a count of 10 with 3 rest breaks. The pt went on to complete a ball toss exercise from various angle while seated for retrieval of the ball and tossing it back to the  instruction with modification made in her anatomical positioning to improve static and dynamic balance.  The pt went on to complete sit to stands using the RW for additional balance with ModA for coming from sit to stand. At the end of the session, the pt remained at w/c LOF with the call light and bed side table within reach and all additional needs addressed.   Therapeutic Activities;UE/LE Strength taining/ROM;UE/LE Coordination activities;Therapeutic  Exercise;Patient/family education;Self Care/advanced ADL retraining;Balance/vestibular training;Functional mobility training  Patient agreed on complete  Therapy Documentation Precautions:  Precautions Precautions: Fall Precaution Comments: RLE NWB Restrictions Weight Bearing Restrictions Per Provider Order: Yes RLE Weight Bearing Per Provider Order: Non weight bearing  Therapy/Group: Individual Therapy  Lavona Mound 05/03/2023, 4:43 PM

## 2023-05-03 NOTE — Progress Notes (Signed)
Patient does not have active pressure injury on buttocks. Can see where prior wound had healed. Fully epithelialized. Will continue preventative pressure relief measures.

## 2023-05-03 NOTE — Progress Notes (Signed)
Inpatient Rehabilitation Admission Medication Review by a Pharmacist  A complete drug regimen review was completed for this patient to identify any potential clinically significant medication issues.  High Risk Drug Classes Is patient taking? Indication by Medication  Antipsychotic No   Anticoagulant No   Antibiotic No   Opioid Yes Oxycodone - pain  Antiplatelet No   Hypoglycemics/insulin No   Vasoactive Medication Yes Amlodipine and lisinopril - HTN  Chemotherapy No   Other Yes Tylenol - pain Amicar - hereditary hemorrhagic telangiectasia Celexa - MDD Pantoprazole - GERD Miralax + senna - doc - constipation Robaxin - muscle spasms Trazadone - sleep      Type of Medication Issue Identified Description of Issue Recommendation(s)  Drug Interaction(s) (clinically significant)     Duplicate Therapy     Allergy     No Medication Administration End Date     Incorrect Dose     Additional Drug Therapy Needed     Significant med changes from prior encounter (inform family/care partners about these prior to discharge).    Other       Clinically significant medication issues were identified that warrant physician communication and completion of prescribed/recommended actions by midnight of the next day:  No  Name of provider notified for urgent issues identified:   Provider Method of Notification:     Pharmacist comments:   Time spent performing this drug regimen review (minutes):  15   Jeanella Cara, PharmD, [place] Clinical Pharmacist Please see AMION for all Pharmacists' Contact Phone Numbers 05/03/2023, 7:16 AM

## 2023-05-03 NOTE — Progress Notes (Signed)
PROGRESS NOTE   Subjective/Complaints: Had a good first day here Pressure injury well healed No questions/concerns No issues overnight   Objective:   No results found. Recent Labs    05/01/23 0019 05/02/23 0355  WBC 9.1 9.3  HGB 7.5* 8.1*  HCT 25.5* 26.5*  PLT 464* 462*   No results for input(s): "NA", "K", "CL", "CO2", "GLUCOSE", "BUN", "CREATININE", "CALCIUM" in the last 72 hours.  Intake/Output Summary (Last 24 hours) at 05/03/2023 1454 Last data filed at 05/03/2023 1320 Gross per 24 hour  Intake 480 ml  Output --  Net 480 ml        Physical Exam: Vital Signs Blood pressure (!) 136/58, pulse (!) 56, temperature 98.5 F (36.9 C), temperature source Oral, resp. rate 17, height 5\' 4"  (1.626 m), SpO2 100%. Gen: no distress, normal appearing HEENT: oral mucosa pink and moist, NCAT Cardiovascular:     Rate and Rhythm: Bradycardia present.  Pulmonary:     Effort: Pulmonary effort is normal.  Abdominal:     General: There is no distension.     Palpations: Abdomen is soft.  Musculoskeletal:        General: Swelling present. Normal range of motion.     Cervical back: Normal range of motion.     Comments: Right leg in splint  Skin:    General: Skin is warm.     Coloration: Skin is not jaundiced.     Comments: Skin scaly, dry in LE's  Neurological:     Mental Status: She is alert.     Comments: Alert and oriented x 3. Normal insight and awareness. Intact Memory. Normal language and speech. Cranial nerve exam unremarkable. MMT: UE 4/5 prox to 4+/5 distal. RLE- 2/5 (pain), able to wiggle toes. LLE 3/5 HF, KE and 4+/5 ADF/PF.  Sensory exam normal for light touch and pain in all 4 limbs. No limb ataxia or cerebellar signs. No abnormal tone appreciated.    Psychiatric:        Mood and Affect: Mood normal.        Behavior: Behavior normal.      Assessment/Plan: 1. Functional deficits which require 3+ hours per  day of interdisciplinary therapy in a comprehensive inpatient rehab setting. Physiatrist is providing close team supervision and 24 hour management of active medical problems listed below. Physiatrist and rehab team continue to assess barriers to discharge/monitor patient progress toward functional and medical goals  Care Tool:  Bathing              Bathing assist       Upper Body Dressing/Undressing Upper body dressing        Upper body assist      Lower Body Dressing/Undressing Lower body dressing            Lower body assist       Toileting Toileting    Toileting assist       Transfers Chair/bed transfer  Transfers assist           Locomotion Ambulation   Ambulation assist              Walk 10 feet activity   Assist  Walk 50 feet activity   Assist Walk 50 feet with 2 turns activity did not occur: Safety/medical concerns         Walk 150 feet activity   Assist Walk 150 feet activity did not occur: Safety/medical concerns         Walk 10 feet on uneven surface  activity   Assist Walk 10 feet on uneven surfaces activity did not occur: Safety/medical concerns         Wheelchair     Assist Is the patient using a wheelchair?: Yes Type of Wheelchair: Manual    Wheelchair assist level: Supervision/Verbal cueing Max wheelchair distance: 150'    Wheelchair 50 feet with 2 turns activity    Assist        Assist Level: Supervision/Verbal cueing   Wheelchair 150 feet activity     Assist      Assist Level: Supervision/Verbal cueing   Blood pressure (!) 136/58, pulse (!) 56, temperature 98.5 F (36.9 C), temperature source Oral, resp. rate 17, height 5\' 4"  (1.626 m), SpO2 100%.  Medical Problem List and Plan: 1. Functional deficits secondary to right trimalleolar ankle fx after syncopal episode.              -patient may shower if RLE is covered             -ELOS/Goals: 8-13 days,  mod  I to supervision goals with PT and OT   2.  Antithrombotics: -DVT/anticoagulation:  Mechanical:  Antiembolism stockings, knee (TED hose) Bilateral lower extremities (chemo prophy d/c due to epistaxis, anemia)             -antiplatelet therapy: none   3. Pain Management:  -continue Tylenol 1000 mg q 8 hours             -continue oxycodone 10 mg q 4 hours prn   4. Chronic depression/Insomnia: LCSW to evaluate and provide emotional support             -continue Celexa 20 mg daily             -trazodone 50 mg q HS prn sleep             -pt was on at bedtime remeron and xanax prn at home (not resumed)             -antipsychotic agents: n/a   5. Neuropsych/cognition: This patient is capable of making decisions on her own behalf.   6. Skin/Wound Care: Routine skin care checks   7. 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 [facility]             -follow-up CBC             -goal to keep hgb >8  14: Intermittent epistaxis due to #11: - Afrin, other measures as needed             -consider ENT consultation for hemostasis              15: Right ankle fracture s/p ORIF 1/19, Dr. Hulda Humphrey (has nylon sutures and short leg splint with posterior slab and stirrups)             -NWB for 8-10 weeks             -follow-up ortho in 2-3 weeks post-op   16: Tobacco use: cessation counseling   17: Code status: DNR-limited       19: DM-2: A1c = 5.0% October 2024  (glucoses ~100-120s on regular diet)             -home metformin 500 mg BID not restarted             -d/c CBGs  D/c Boost   20: Thrombocytosis: follow-up CBC   21: New onset nausea:             -pt reports that it came on after FFP yesterday afternoon.             -had large bm this morning with no improvement in symptoms              -continue with prn tigan, observe for now, belly is benign             -I don't see any other obvious causes the moment.   22. Diastolic hypotension: decrease Norvasc to 5mg  daily  23. Systolic hypertension: add magnesium gluconate 250mg  HS  24. Constipation: Last BM 1/17, d/c fleet enema    LOS: 1 days A FACE TO FACE EVALUATION WAS PERFORMED  Clint Bolder P Peggy House 05/03/2023, 2:54 PM

## 2023-05-03 NOTE — Plan of Care (Signed)
  Problem: RH Balance Goal: LTG Patient will maintain dynamic standing balance (PT) Description: LTG:  Patient will maintain dynamic standing balance with assistance during mobility activities (PT) Flowsheets (Taken 05/03/2023 1000) LTG: Pt will maintain dynamic standing balance during mobility activities with:: Contact Guard/Touching assist   Problem: Sit to Stand Goal: LTG:  Patient will perform sit to stand with assistance level (PT) Description: LTG:  Patient will perform sit to stand with assistance level (PT) Flowsheets (Taken 05/03/2023 1000) LTG: PT will perform sit to stand in preparation for functional mobility with assistance level: Contact Guard/Touching assist   Problem: RH Bed Mobility Goal: LTG Patient will perform bed mobility with assist (PT) Description: LTG: Patient will perform bed mobility with assistance, with/without cues (PT). Flowsheets (Taken 05/03/2023 1000) LTG: Pt will perform bed mobility with assistance level of: Supervision/Verbal cueing   Problem: RH Bed to Chair Transfers Goal: LTG Patient will perform bed/chair transfers w/assist (PT) Description: LTG: Patient will perform bed to chair transfers with assistance (PT). Flowsheets (Taken 05/03/2023 1000) LTG: Pt will perform Bed to Chair Transfers with assistance level: Contact Guard/Touching assist   Problem: RH Car Transfers Goal: LTG Patient will perform car transfers with assist (PT) Description: LTG: Patient will perform car transfers with assistance (PT). Flowsheets (Taken 05/03/2023 1000) LTG: Pt will perform car transfers with assist:: Minimal Assistance - Patient > 75%   Problem: RH Ambulation Goal: LTG Patient will ambulate in controlled environment (PT) Description: LTG: Patient will ambulate in a controlled environment, # of feet with assistance (PT). Flowsheets (Taken 05/03/2023 1000) LTG: Pt will ambulate in controlled environ  assist needed:: Contact Guard/Touching assist LTG: Ambulation  distance in controlled environment: 26ft Goal: LTG Patient will ambulate in home environment (PT) Description: LTG: Patient will ambulate in home environment, # of feet with assistance (PT). Flowsheets (Taken 05/03/2023 1000) LTG: Pt will ambulate in home environ  assist needed:: Contact Guard/Touching assist LTG: Ambulation distance in home environment: 25ft   Problem: RH Wheelchair Mobility Goal: LTG Patient will propel w/c in controlled environment (PT) Description: LTG: Patient will propel wheelchair in controlled environment, # of feet with assist (PT) Flowsheets (Taken 05/03/2023 1000) LTG: Pt will propel w/c in controlled environ  assist needed:: Independent with assistive device LTG: Propel w/c distance in controlled environment: 153ft Goal: LTG Patient will propel w/c in home environment (PT) Description: LTG: Patient will propel wheelchair in home environment, # of feet with assistance (PT). Flowsheets (Taken 05/03/2023 1000) LTG: Pt will propel w/c in home environ  assist needed:: Independent with assistive device LTG: Propel w/c distance in home environment: 3ft

## 2023-05-03 NOTE — Evaluation (Signed)
Occupational Therapy Assessment and Plan  Patient Details  Name: Peggy House MRN: [id number] Date of Birth: [birth year]  OT Diagnosis: muscle weakness (generalized) Rehab Potential: Rehab Potential (ACUTE ONLY): Good ELOS: 1-2 weeks   Today's Date: 05/03/2023 OT Individual Time: 1102-1200 OT Individual Time Calculation (min): 58 min     Hospital Problem: Principal Problem:   Ankle fracture   Past Medical History:  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   Past Surgical History:  Past Surgical History:  Procedure Laterality Date   ABDOMINAL HYSTERECTOMY     CARPAL TUNNEL RELEASE  05/13/2011   Procedure: CARPAL TUNNEL RELEASE;  Surgeon: Mable Paris, MD;  Location: [facility];  Service: Orthopedics;  Laterality: Left;   COLONOSCOPY N/A 03/02/2020   Procedure: COLONOSCOPY;  Surgeon: Bernette Redbird, MD;  Location: [facility] ENDOSCOPY;  Service: Endoscopy;  Laterality: N/A;   COLONOSCOPY WITH PROPOFOL N/A 04/28/2014   Procedure: COLONOSCOPY WITH PROPOFOL;  Surgeon: Florencia Reasons, MD;  Location: [facility] ENDOSCOPY;  Service: Endoscopy;  Laterality: N/A;   DG TOES*L*  2/10   rt   DILATION AND CURETTAGE OF UTERUS     ENTEROSCOPY N/A 10/17/2017   Procedure: ENTEROSCOPY;  Surgeon: Kathi Der, MD;  Location: [facility] ENDOSCOPY;  Service: Gastroenterology;  Laterality: N/A;   ESOPHAGOGASTRODUODENOSCOPY N/A 04/10/2014   Procedure: ESOPHAGOGASTRODUODENOSCOPY (EGD);  Surgeon: Shirley Friar, MD;  Location: [facility]COPY;  Service: Endoscopy;  Laterality: N/A;   ESOPHAGOGASTRODUODENOSCOPY N/A 05/10/2017   Procedure:  ESOPHAGOGASTRODUODENOSCOPY (EGD);  Surgeon: Bernette Redbird, MD;  Location: [facility] ENDOSCOPY;  Service: Endoscopy;  Laterality: N/A;   ESOPHAGOGASTRODUODENOSCOPY N/A 09/22/2017   Procedure: ESOPHAGOGASTRODUODENOSCOPY (EGD);  Surgeon: Vida Rigger, MD;  Location: [facility] ENDOSCOPY;  Service: Endoscopy;  Laterality: N/A;  bedside   ESOPHAGOGASTRODUODENOSCOPY N/A 03/02/2020   Procedure: ESOPHAGOGASTRODUODENOSCOPY (EGD);  Surgeon: Bernette Redbird, MD;  Location: [facility];  Service: Endoscopy;  Laterality: N/A;   ESOPHAGOGASTRODUODENOSCOPY N/A 11/03/2020   Procedure: ESOPHAGOGASTRODUODENOSCOPY (EGD);  Surgeon: Charlott Rakes, MD;  Location: [facility] ENDOSCOPY;  Service: Endoscopy;  Laterality: N/A;   ESOPHAGOGASTRODUODENOSCOPY N/A 04/12/2022   Procedure: ESOPHAGOGASTRODUODENOSCOPY (EGD);  Surgeon: Vida Rigger, MD;  Location: [facility]COPY;  Service: Gastroenterology;  Laterality: N/A;   ESOPHAGOGASTRODUODENOSCOPY N/A 05/27/2022   Procedure: ESOPHAGOGASTRODUODENOSCOPY (EGD);  Surgeon: Willis Modena, MD;  Location: [facility] ENDOSCOPY;  Service: Gastroenterology;  Laterality: N/A;   ESOPHAGOGASTRODUODENOSCOPY N/A 09/27/2022   Procedure: ESOPHAGOGASTRODUODENOSCOPY (EGD);  Surgeon: Lynann Bologna, DO;  Location: [facility] ENDOSCOPY;  Service: Gastroenterology;  Laterality: N/A;   ESOPHAGOGASTRODUODENOSCOPY (EGD) WITH PROPOFOL N/A 04/27/2014   Procedure: ESOPHAGOGASTRODUODENOSCOPY (EGD) WITH PROPOFOL;  Surgeon: Florencia Reasons, MD;  Location: [facility] ENDOSCOPY;  Service: Endoscopy;  Laterality: N/A;  possible apc   ESOPHAGOGASTRODUODENOSCOPY (EGD) WITH PROPOFOL N/A 09/30/2017   Procedure: ESOPHAGOGASTRODUODENOSCOPY (EGD) WITH PROPOFOL;  Surgeon: Kerin Salen, MD;  Location: [facility] ENDOSCOPY;  Service: Gastroenterology;  Laterality: N/A;   ESOPHAGOGASTRODUODENOSCOPY (EGD) WITH PROPOFOL N/A 10/01/2017   Procedure: ESOPHAGOGASTRODUODENOSCOPY (EGD) WITH PROPOFOL;  Surgeon: Kerin Salen, MD;  Location: [facility] ENDOSCOPY;  Service: Gastroenterology;   Laterality: N/A;   ESOPHAGOGASTRODUODENOSCOPY (EGD) WITH PROPOFOL N/A 10/08/2017   Procedure:  ESOPHAGOGASTRODUODENOSCOPY (EGD) WITH PROPOFOL;  Surgeon: Kathi Der, MD;  Location: [facility] ENDOSCOPY;  Service: Gastroenterology;  Laterality: N/A;   ESOPHAGOGASTRODUODENOSCOPY (EGD) WITH PROPOFOL N/A 10/17/2017   Procedure: ESOPHAGOGASTRODUODENOSCOPY (EGD) WITH PROPOFOL;  Surgeon: Kathi Der, MD;  Location: [facility] ENDOSCOPY;  Service: Gastroenterology;  Laterality: N/A;   ESOPHAGOGASTRODUODENOSCOPY (EGD) WITH PROPOFOL N/A 10/19/2017   Procedure: ESOPHAGOGASTRODUODENOSCOPY (EGD) WITH PROPOFOL;  Surgeon: Kathi Der, MD;  Location: [facility] ENDOSCOPY;  Service: Gastroenterology;  Laterality: N/A;   ESOPHAGOGASTRODUODENOSCOPY (EGD) WITH PROPOFOL N/A 12/04/2018   Procedure: ESOPHAGOGASTRODUODENOSCOPY (EGD) WITH PROPOFOL;  Surgeon: Charlott Rakes, MD;  Location: [facility] ENDOSCOPY;  Service: Endoscopy;  Laterality: N/A;   GIVENS CAPSULE STUDY N/A 10/02/2017   Procedure: GIVENS CAPSULE STUDY;  Surgeon: Kerin Salen, MD;  Location: [facility] ENDOSCOPY;  Service: Gastroenterology;  Laterality: N/A;   GIVENS CAPSULE STUDY N/A 10/08/2017   Procedure: GIVENS CAPSULE STUDY;  Surgeon: Kathi Der, MD;  Location: [facility] ENDOSCOPY;  Service: Gastroenterology;  Laterality: N/A;  endoscopic placement of capsule   GIVENS CAPSULE STUDY N/A 03/02/2020   Procedure: GIVENS CAPSULE STUDY;  Surgeon: Bernette Redbird, MD;  Location: [facility] ENDOSCOPY;  Service: Endoscopy;  Laterality: N/A;   HEMOSTASIS CLIP PLACEMENT  12/04/2018   Procedure: HEMOSTASIS CLIP PLACEMENT;  Surgeon: Charlott Rakes, MD;  Location: [facility] ENDOSCOPY;  Service: Endoscopy;;   HEMOSTASIS CLIP PLACEMENT  05/27/2022   Procedure: HEMOSTASIS CLIP PLACEMENT;  Surgeon: Willis Modena, MD;  Location: [facility] ENDOSCOPY;  Service: Gastroenterology;;   HEMOSTASIS CLIP PLACEMENT  09/27/2022   Procedure: HEMOSTASIS CLIP PLACEMENT;  Surgeon: Lynann Bologna, DO;  Location: [facility] ENDOSCOPY;   Service: Gastroenterology;;   HEMOSTASIS CONTROL  05/27/2022   Procedure: HEMOSTASIS CONTROL;  Surgeon: Willis Modena, MD;  Location: [facility]OSCOPY;  Service: Gastroenterology;;   HOT HEMOSTASIS N/A 04/27/2014   Procedure: HOT HEMOSTASIS (ARGON PLASMA COAGULATION/BICAP);  Surgeon: Florencia Reasons, MD;  Location: [facility] ENDOSCOPY;  Service: Endoscopy;  Laterality: N/A;   HOT HEMOSTASIS N/A 09/30/2017   Procedure: HOT HEMOSTASIS (ARGON PLASMA COAGULATION/BICAP);  Surgeon: Kerin Salen, MD;  Location: [facility] ENDOSCOPY;  Service: Gastroenterology;  Laterality: N/A;   HOT HEMOSTASIS N/A 10/01/2017   Procedure: HOT HEMOSTASIS (ARGON PLASMA COAGULATION/BICAP);  Surgeon: Kerin Salen, MD;  Location: [facility] ENDOSCOPY;  Service: Gastroenterology;  Laterality: N/A;   HOT HEMOSTASIS N/A 10/17/2017   Procedure: HOT HEMOSTASIS (ARGON PLASMA COAGULATION/BICAP);  Surgeon: Kathi Der, MD;  Location: [facility] ENDOSCOPY;  Service: Gastroenterology;  Laterality: N/A;   HOT HEMOSTASIS N/A 10/19/2017   Procedure: HOT HEMOSTASIS (ARGON PLASMA COAGULATION/BICAP);  Surgeon: Kathi Der, MD;  Location: [facility] ENDOSCOPY;  Service: Gastroenterology;  Laterality: N/A;   HOT HEMOSTASIS N/A 03/02/2020   Procedure: HOT HEMOSTASIS (ARGON PLASMA COAGULATION/BICAP);  Surgeon: Bernette Redbird, MD;  Location: [facility];  Service: Endoscopy;  Laterality: N/A;   HOT HEMOSTASIS N/A 04/12/2022   Procedure: HOT HEMOSTASIS (ARGON PLASMA COAGULATION/BICAP);  Surgeon: Vida Rigger, MD;  Location: [facility];  Service: Gastroenterology;  Laterality: N/A;   IR IMAGING GUIDED PORT INSERTION  07/08/2018   L shoulder Surgery  2011   ORIF ANKLE FRACTURE Right 04/24/2023   Procedure: OPEN REDUCTION INTERNAL FIXATION (ORIF) ANKLE FRACTURE;  Surgeon: Luci Bank, MD;  Location: [facility] OR;  Service: Orthopedics;  Laterality: Right;   POLYPECTOMY  03/02/2020   Procedure: POLYPECTOMY;  Surgeon: Bernette Redbird, MD;  Location: [facility] ENDOSCOPY;  Service: Endoscopy;;    SCLEROTHERAPY  11/03/2020   Procedure: Susa Day;  Surgeon: Charlott Rakes, MD;  Location: [facility] ENDOSCOPY;  Service: Endoscopy;;   SUBMUCOSAL INJECTION  09/22/2017  Procedure: SUBMUCOSAL INJECTION;  Surgeon: Vida Rigger, MD;  Location: [facility] ENDOSCOPY;  Service: Endoscopy;;   SUBMUCOSAL INJECTION  12/04/2018   Procedure: SUBMUCOSAL INJECTION;  Surgeon: Charlott Rakes, MD;  Location: [facility] ENDOSCOPY;  Service: Endoscopy;;    Assessment & Plan Clinical Impression: . Iridessa Heidebrink is a 63 year old female with a history of HTT presented to the ED on 04/13/2023 reporting syncopal episode and fall resulting in right ankle fracture.  She exhibited profound anemia on lab work with a hemoglobin 5.1. She underwent ORIF by Dr. Hulda Humphrey on 1/09 and she is NWB for 6-8 weeks. She continued to have nose bleeds, per primary team, the risk of chemoppx for DVT outweighs the benefits. Xarelto discontinued and SCDs placed for DVT ppx. Transfused additional unit of pRBC for a total of 10 units since admission with one unit of FFP. Follows with Dr. Malachy Mood. Cardiac workup for syncope, echo showed grade 2 diastolic dysfunction.  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 s/p IR embolization of gastric artery branch vessels on 12/10/17, cauterization of stomach for severe GI bleed in 11/2018, and epinephrine injection for bleeding ulcer on 11/03/20. She has required frequent blood transfusions, IV iron and bevacizumab. Has indwelling right internal jugular venous access device. PMH significant for DM-2, hypertension, depression. Pt able to stand pivot to [facility] with min A for RLE NWB and balance and dense cues for technique to pivot. Pt demonstrates difficulty increasing BUE support to pivot on LLE with tendency to hop on LLE, but is able to improve with cues and practice. The patient requires inpatient medicine and rehabilitation evaluations and services for ongoing dysfunction secondary to  debility.  Patient transferred to CIR on 05/02/2023 .    Patient currently requires  Min/MaxA   with basic self-care skills secondary to muscle weakness and current NWB status associated with the RLE secondary to ankle fx.  Prior to hospitalization, patient could complete BALD related task  with modified independent .  Patient will benefit from skilled intervention to increase independence with basic self-care skills prior to discharge home with care partner.  Anticipate patient will require intermittent supervision and follow up home health.  OT - End of Session Activity Tolerance: Tolerates 30+ min activity with multiple rests Endurance Deficit: Yes Endurance Deficit Description: Fatigues quickly; requires seated rest breaks for recovery (based on chart review and performance) OT Assessment Rehab Potential (ACUTE ONLY): Good OT Patient demonstrates impairments in the following area(s): Balance;Endurance;Motor OT Basic ADL's Functional Problem(s): Bathing;Dressing;Toileting OT Transfers Functional Problem(s): Tub/Shower OT Plan OT Frequency: 5 out of 7 days;Total of 15 hours over 7 days of combined therapies OT Duration/Estimated Length of Stay: 1-2 weeks OT Treatment/Interventions: Therapeutic Activities;UE/LE Strength taining/ROM;UE/LE Coordination activities;Therapeutic Exercise;Patient/family education;Self Care/advanced ADL retraining;Balance/vestibular training;Functional mobility training OT Self Feeding Anticipated Outcome(s): s/u OT Basic Self-Care Anticipated Outcome(s): ModI OT Toileting Anticipated Outcome(s): ModI OT Bathroom Transfers Anticipated Outcome(s): ModI OT Recommendation Patient destination: Home Follow Up Recommendations: Home health OT (to be determined prior to d/c) Equipment Recommended: 3 in 1 bedside comode;Rolling walker with 5" wheels;Tub/shower bench   OT Evaluation Precautions/Restrictions  Restrictions Weight Bearing Restrictions Per Provider Order:  Yes RLE Weight Bearing Per Provider Order: Non weight bearing General   Vital Signs Therapy Vitals Temp: 98.5 F (36.9 C) Temp Source: Oral Pulse Rate: (!) 56 Resp: 17 BP: (!) 136/58 Patient Position (if appropriate): Sitting Oxygen Therapy SpO2: 100 % O2 Device: Room Air Pain   Home Living/Prior Functioning Home  Living Family/patient expects to be discharged to:: Private residence Living Arrangements: Children Available Help at Discharge: Family, Available PRN/intermittently, Other (Comment) (The son's GF and  the pt's sister) Type of Home: Apartment Home Access: Level entry Home Layout: One level Bathroom Shower/Tub: Sponge bathes at baseline, Engineer, manufacturing systems: Standard Bathroom Accessibility: Yes Additional Comments: Pt lives with son who works 3pm-3am  Lives With: Son, Family (son and his girl friend  with assistance from her sister) IADL History Homemaking Responsibilities: No (Son's GF and pt's sister) Current License: No Mode of Transportation: Family Occupation: On disability Leisure and Hobbies: Enjoys listening and watch christian Tv Prior Function Level of Independence: Requires assistive device for independence (RW for functional mobility)  Able to Take Stairs?: Yes Driving: No Vocation: On disability Editor, commissioning    Praxis Praxis: WFL Cognition Cognition Overall Cognitive Status: Within Functional Limits for tasks assessed Arousal/Alertness: Awake/alert Memory: Appears intact Attention: Focused;Sustained Focused Attention: Appears intact Sustained Attention: Appears intact Awareness: Appears intact Problem Solving: Appears intact Safety/Judgment: Appears intact Brief Interview for Mental Status (BIMS) Repetition of Three Words (First Attempt): 3 Temporal Orientation: Year: Correct Temporal Orientation: Month: Accurate within 5 days Temporal Orientation: Day: Correct Recall: "Sock": Yes, no cue required Recall: "Blue":  Yes, after cueing ("a color") Recall: "Bed": Yes, no cue required BIMS Summary Score: 14 Sensation Sensation Light Touch: Appears Intact (able to detect external stimuli with vision occluded) Coordination Gross Motor Movements are Fluid and Coordinated: No Fine Motor Movements are Fluid and Coordinated: Yes (with some UB weakness noted) Coordination and Movement Description: Limited by pain and R ankle fx Motor  Motor Motor: Other (comment) (limited LB funclional mobiliity secondary to NWB of  RLE impacting how she approaches her environment.) Motor - Skilled Clinical Observations: Generalized weakness and deconditioning s/p ORIF R ankle  Trunk/Postural Assessment  Cervical Assessment Cervical Assessment: Within Functional Limits Thoracic Assessment Thoracic Assessment: Within Functional Limits Lumbar Assessment Lumbar Assessment: Within Functional Limits Postural Control Postural Control: Within Functional Limits  Balance Static Sitting Balance Static Sitting - Level of Assistance: 7: Independent Dynamic Sitting Balance Dynamic Sitting - Balance Support: Feet supported;No upper extremity supported Dynamic Sitting - Level of Assistance: 5: Stand by assistance (Mod/MaxA) Static Standing Balance Static Standing - Balance Support: Bilateral upper extremity supported Static Standing - Level of Assistance: 4: Min assist Dynamic Standing Balance Dynamic Standing - Balance Support: Bilateral upper extremity supported;During functional activity Dynamic Standing - Level of Assistance: 3: Mod assist (Using RW with Mod/Maxx1) Extremity/Trunk Assessment RUE Assessment RUE Assessment: Within Functional Limits Passive Range of Motion (PROM) Comments: WFL Active Range of Motion (AROM) Comments: WFL General Strength Comments: 3+/MMT LUE Assessment Passive Range of Motion (PROM) Comments: WFL Active Range of Motion (AROM) Comments: WFL General Strength Comments: 3+/5MMT  Care Tool Care  Tool Self Care Eating   Eating Assist Level: Set up assist (observe patient eating meal with s/u A.)    Oral Care    Oral Care Assist Level: Set up assist (Patient was able to complete oral care with s/u A.)    Bathing   Body parts bathed by patient: Right arm;Left arm;Chest;Abdomen;Front perineal area;Buttocks;Right upper leg;Left upper leg;Face (During the performance of simulated task with s/uA for UB and Mod/Max for coming from sit to stand for her perineal and bottom.) Body parts bathed by helper: Left lower leg;Right lower leg   Assist Level: Minimal Assistance - Patient > 75% (Min to ModA for task performance)    Upper  Body Dressing(including orthotics)   What is the patient wearing?: Pull over shirt   Assist Level: Set up assist    Lower Body Dressing (excluding footwear)   What is the patient wearing?: Underwear/pull up;Pants Assist for lower body dressing: Moderate Assistance - Patient 50 - 74% (Mod/MaxA for task involving sit to stand)    Putting on/Taking off footwear     Assist for footwear: Maximal Assistance - Patient 25 - 49% (MaxA to Dep with BLE of lower part of the leg and feet.)       Care Tool Toileting Toileting activity   Assist for toileting: Moderate Assistance - Patient 50 - 74% (ModA for bring threading the items onto her lets and pulling them up.)     Care Tool Bed Mobility Roll left and right activity   Roll left and right assist level: Minimal Assistance - Patient > 75% (Min/ModA)    Sit to lying activity   Sit to lying assist level: Minimal Assistance - Patient > 75% (Mod/MaxA for BLE management)    Lying to sitting on side of bed activity         Care Tool Transfers Sit to stand transfer   Sit to stand assist level: Moderate Assistance - Patient 50 - 74% (Mod/MaxA using the RW)    Chair/bed transfer   Chair/bed transfer assist level: Moderate Assistance - Patient 50 - 74% (Mod/Max A using the bed rail)     Toilet transfer   Assist  Level: Moderate Assistance - Patient 50 - 74% (ModA using the RW)     Care Tool Cognition  Expression of Ideas and Wants Expression of Ideas and Wants: 4. Without difficulty (complex and basic) - expresses complex messages without difficulty and with speech that is clear and easy to understand  Understanding Verbal and Non-Verbal Content Understanding Verbal and Non-Verbal Content: 4. Understands (complex and basic) - clear comprehension without cues or repetitions   Memory/Recall Ability Memory/Recall Ability : Current season;That he or she is in a hospital/hospital unit   Refer to Care Plan for Long Term Goals  SHORT TERM GOAL WEEK 1 OT Short Term Goal 1 (Week 1): Patient will improve UB strength to 4/5MMT at 95% safe OT Short Term Goal 2 (Week 1): The pt will complete LB bathing and dressing at MinA at 95% safe OT Short Term Goal 3 (Week 1): The pt will complete functional t/f's to all surfaces with ModI using RW at 95% safe OT Short Term Goal 4 (Week 1): The pt will adhere to RLE precaution of NWB 100% of the time without v/c's or reminders. OT Short Term Goal 5 (Week 1): The pt will tolerate > 40 minutes of functional activity at 95% safe to improve her functional outcome.  Recommendations for other services: None    Skilled Therapeutic Intervention Patient seated at w/c LOF at the time of arrival.  Patient presents with a willing to participate and to improve her functional outcome. Patient was able adhere for NWB status of the RLE with no safety concerns noted. The pt was able to complete simulated task in bathing and dressing. The pt required s/uA for donning the theraband for UB dressing and the pt was ModA for donning the theraband onto BLE for treading her legs into the opening.  The pt was ModA for coming from sit to stand using the RW for additional balance.  The pt was MaxA for bathing BLE in relation to the lower portion inclusive of her legs  and feet. The pt transferred  was  able to come from sit to stand at [facility] she was able to transfer  to EOB using the bed rail at the same LOF The pt was MaxA for managing BLE for placement on to the  bed. In my opinion, the pt would benefit from skilled OT training to improve her current LOF and to reduce the burden of care for the care provider with focus of core strength, functional t/f's, sit to stand and BADL related task in bathing and dressing LB., and activity tolerance.  It is my recommendation that the pt receive OT treatment 1-2 weeks  in duration to improve her functional outcome, with   HH to be determined prior to discharge.  ADL   Mobility  Bed Mobility Bed Mobility: Rolling Right;Rolling Left;Supine to Sit;Sit to Supine Rolling Right: Minimal Assistance - Patient > 75% (per chart review) Sit to Supine:  (ModA) Transfers Sit to Stand: Moderate Assistance - Patient 50-74% (Mod/MaxA) Stand to Sit: Moderate Assistance - Patient 50-74% (Mod/Max using the RW)   Discharge Criteria: Patient will be discharged from OT if patient refuses treatment 3 consecutive times without medical reason, if treatment goals not met, if there is a change in medical status, if patient makes no progress towards goals or if patient is discharged from hospital.  The above assessment, treatment plan, treatment alternatives and goals were discussed and mutually agreed upon: by patient  Lavona Mound 05/03/2023, 4:40 PM

## 2023-05-04 DIAGNOSIS — S82891S Other fracture of right lower leg, sequela: Secondary | ICD-10-CM | POA: Diagnosis not present

## 2023-05-04 MED ORDER — MAGNESIUM GLUCONATE 500 MG PO TABS
500.0000 mg | ORAL_TABLET | Freq: Every day | ORAL | Status: DC
  Administered 2023-05-04 – 2023-05-18 (×15): 500 mg via ORAL
  Filled 2023-05-04 (×15): qty 1

## 2023-05-04 MED ORDER — AMLODIPINE BESYLATE 2.5 MG PO TABS: 2.5000 mg | ORAL_TABLET | Freq: Every day | ORAL | Status: DC

## 2023-05-04 MED ORDER — AMLODIPINE BESYLATE 10 MG PO TABS
10.0000 mg | ORAL_TABLET | Freq: Every day | ORAL | Status: DC
  Administered 2023-05-05 – 2023-05-08 (×4): 10 mg via ORAL
  Filled 2023-05-04 (×4): qty 1

## 2023-05-04 MED ORDER — LISINOPRIL 10 MG PO TABS
10.0000 mg | ORAL_TABLET | Freq: Every day | ORAL | Status: DC
  Administered 2023-05-05 – 2023-05-15 (×11): 10 mg via ORAL
  Filled 2023-05-04 (×11): qty 1

## 2023-05-04 NOTE — Progress Notes (Signed)
PROGRESS NOTE   Subjective/Complaints: No new complaints Had good day with therapy yesterday Diastolic BP soft- will decrease lisinopril to 10mg   ROS: +pain   Objective:   No results found. Recent Labs    05/02/23 0355  WBC 9.3  HGB 8.1*  HCT 26.5*  PLT 462*   No results for input(s): "NA", "K", "CL", "CO2", "GLUCOSE", "BUN", "CREATININE", "CALCIUM" in the last 72 hours.  Intake/Output Summary (Last 24 hours) at 05/04/2023 1707 Last data filed at 05/04/2023 1326 Gross per 24 hour  Intake 476 ml  Output --  Net 476 ml        Physical Exam: Vital Signs Blood pressure (!) 143/62, pulse (!) 51, temperature 98.3 F (36.8 C), temperature source Oral, resp. rate 18, height 5\' 4"  (1.626 m), SpO2 100%. Gen: no distress, normal appearing HEENT: oral mucosa pink and moist, NCAT Cardiovascular:     Rate and Rhythm: Bradycardia present.  Pulmonary:     Effort: Pulmonary effort is normal.  Abdominal:     General: There is no distension.     Palpations: Abdomen is soft.  Musculoskeletal:        General: Swelling present. Normal range of motion.     Cervical back: Normal range of motion.     Comments: Right leg in splint  Skin:    General: Skin is warm.     Coloration: Skin is not jaundiced.     Comments: Skin scaly, dry in LE's  Neurological:     Mental Status: She is alert.     Comments: Alert and oriented x 3. Normal insight and awareness. Intact Memory. Normal language and speech. Cranial nerve exam unremarkable. MMT: UE 4/5 prox to 4+/5 distal. RLE- 2/5 (pain), able to wiggle toes. LLE 3/5 HF, KE and 4+/5 ADF/PF.  Sensory exam normal for light touch and pain in all 4 limbs. No limb ataxia or cerebellar signs. No abnormal tone appreciated.   Stable 1/19 Psychiatric:        Mood and Affect: Mood normal.        Behavior: Behavior normal.      Assessment/Plan: 1. Functional deficits which require 3+ hours per day  of interdisciplinary therapy in a comprehensive inpatient rehab setting. Physiatrist is providing close team supervision and 24 hour management of active medical problems listed below. Physiatrist and rehab team continue to assess barriers to discharge/monitor patient progress toward functional and medical goals  Care Tool:  Bathing    Body parts bathed by patient: Right arm, Left arm, Chest, Abdomen, Front perineal area, Buttocks, Right upper leg, Left upper leg, Face (During the performance of simulated task with s/uA for UB and Mod/Max for coming from sit to stand for her perineal and bottom.)   Body parts bathed by helper: Left lower leg, Right lower leg     Bathing assist Assist Level: Minimal Assistance - Patient > 75% (Min to ModA for task performance)     Upper Body Dressing/Undressing Upper body dressing   What is the patient wearing?: Pull over shirt    Upper body assist Assist Level: Set up assist    Lower Body Dressing/Undressing Lower body dressing  What is the patient wearing?: Underwear/pull up, Pants     Lower body assist Assist for lower body dressing: Moderate Assistance - Patient 50 - 74% (Mod/MaxA for task involving sit to stand)     Toileting Toileting    Toileting assist Assist for toileting: Moderate Assistance - Patient 50 - 74% (ModA for bring threading the items onto her lets and pulling them up.)     Transfers Chair/bed transfer  Transfers assist     Chair/bed transfer assist level: Moderate Assistance - Patient 50 - 74% (Mod/Max A using the bed rail)     Locomotion Ambulation   Ambulation assist              Walk 10 feet activity   Assist           Walk 50 feet activity   Assist Walk 50 feet with 2 turns activity did not occur: Safety/medical concerns         Walk 150 feet activity   Assist Walk 150 feet activity did not occur: Safety/medical concerns         Walk 10 feet on uneven surface   activity   Assist Walk 10 feet on uneven surfaces activity did not occur: Safety/medical concerns         Wheelchair     Assist Is the patient using a wheelchair?: Yes Type of Wheelchair: Manual    Wheelchair assist level: Supervision/Verbal cueing Max wheelchair distance: 150'    Wheelchair 50 feet with 2 turns activity    Assist        Assist Level: Supervision/Verbal cueing   Wheelchair 150 feet activity     Assist      Assist Level: Supervision/Verbal cueing   Blood pressure (!) 143/62, pulse (!) 51, temperature 98.3 F (36.8 C), temperature source Oral, resp. rate 18, height 5\' 4"  (1.626 m), SpO2 100%.  Medical Problem List and Plan: 1. Functional deficits secondary to right trimalleolar ankle fx after syncopal episode.              -patient may shower if RLE is covered             -ELOS/Goals: 8-13 days,  mod I to supervision goals with PT and OT  Continue CIR   2.  Antithrombotics: -DVT/anticoagulation:  Mechanical:  Antiembolism stockings, knee (TED hose) Bilateral lower extremities (chemo prophy d/c due to epistaxis, anemia)             -antiplatelet therapy: none   3. Pain Management:  -continue Tylenol 1000 mg q 8 hours             -continue oxycodone 10 mg q 4 hours prn   4. Chronic depression/Insomnia: LCSW to evaluate and provide emotional support             -continue Celexa 20 mg daily             -trazodone 50 mg q HS prn sleep             -pt was on at bedtime remeron and xanax prn at home (not resumed)             -antipsychotic agents: n/a   5. Neuropsych/cognition: This patient is capable of making decisions on her own behalf.   6. Skin/Wound Care: Routine skin care checks   7. 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 [facility]             -follow-up CBC             -goal to keep hgb >8   14: Intermittent epistaxis due to #11: - Afrin, other measures as needed             -consider ENT consultation for hemostasis              15: Right ankle fracture s/p ORIF 1/19, Dr. Hulda Humphrey (has nylon sutures and short leg splint with posterior slab and stirrups)             -NWB for 8-10 weeks             -follow-up ortho in 2-3 weeks post-op   16: Tobacco use: cessation counseling   17: Code status: DNR-limited       19: DM-2: A1c = 5.0% October 2024 (glucoses ~100-120s on regular diet)             -home metformin 500 mg BID not restarted             -d/c CBGs  D/c Boost   20: Thrombocytosis: follow-up CBC   21: New onset nausea:             -pt reports that it came on after FFP yesterday afternoon.             -had large bm this morning with no improvement in symptoms              -continue with prn tigan, observe for now, belly is benign             -I don't see any other obvious causes the moment.   22. Diastolic hypotension: decrease lisinopril to 10mg  daily.  23. Systolic hypertension: increase magnesium gluconate to 500mg  HS, continue amlodipine 10mg  daily  24. Constipation: Last BM 1/17, d/c fleet enema, magnesium gluconate increased to 500mg  HS  25. Recent AKI: decrease lisinopril to 10mg  daily and repeat creatinine tomorrow.     LOS: 2 days A FACE TO FACE EVALUATION WAS PERFORMED  Clint Bolder P Jasemine Nawaz 05/04/2023, 5:07 PM

## 2023-05-04 NOTE — Progress Notes (Signed)
Occupational Therapy Session Note  Patient Details  Name: Peggy House MRN: [id number] Date of Birth: Jan 31, 1961  Today's Date: 05/04/2023 OT Individual Time: 0800-0900 OT Individual Time Calculation (min): 60 min    Short Term Goals: Week 1:  OT Short Term Goal 1 (Week 1): Patient will improve UB strength to 4/5MMT at 95% safe OT Short Term Goal 2 (Week 1): The pt will complete LB bathing and dressing at MinA at 95% safe OT Short Term Goal 3 (Week 1): The pt will complete functional t/f's to all surfaces with ModI using RW at 95% safe OT Short Term Goal 4 (Week 1): The pt will adhere to RLE precaution of NWB 100% of the time without v/c's or reminders. OT Short Term Goal 5 (Week 1): The pt will tolerate > 40 minutes of functional activity at 95% safe to improve her functional outcome.  Skilled Therapeutic Interventions/Progress Updates:    Patient lying in bed with nursing present passing morning meds. The pt was in agreement with completing BADL related task in bathing EOB. The pt was able to come from supine in bed to EOB with MinA for managing BLE.  The pt was able to maintain good sit balance for functional task performance at EOB. The pt was able to wash her face and UB with closeS.  The pt was able to wash BLE with MinA incorporating the long handle sponge for additional reach of more distal extremities, lower leg and feet. Patient instructed in the use of a reacher and long hand sponge for  increase Ind function and safety.   The pt was able to come from sit to stand with ModA using the RW for additional balance for bathing her perineal and bottom at [facility] The pt was MaxA for donning her brief. The pt was able to maintain good anatomical positioning while seated EOB unsupported while eat her breakfast with s/uA.  The pt opted to return to bed LOF and was able to roll from supine to the left for making her bed and positioning at [facility].   Therapy Documentation Precautions:   Precautions Precautions: Fall Precaution Comments: RLE NWB Restrictions Weight Bearing Restrictions Per Provider Order: Yes RLE Weight Bearing Per Provider Order: Non weight bearing   Therapy/Group: Individual Therapy  Lavona Mound 05/04/2023, 12:51 PM

## 2023-05-04 NOTE — Progress Notes (Signed)
Patient's buttocks wound appears resolved. Small 1x1 pink area noted to right buttocks still but appears well epithelialized. Picture in chart. Pink foam applied to for protection.

## 2023-05-05 DIAGNOSIS — Z794 Long term (current) use of insulin: Secondary | ICD-10-CM

## 2023-05-05 DIAGNOSIS — N179 Acute kidney failure, unspecified: Secondary | ICD-10-CM

## 2023-05-05 DIAGNOSIS — S82891S Other fracture of right lower leg, sequela: Secondary | ICD-10-CM | POA: Diagnosis not present

## 2023-05-05 DIAGNOSIS — K5900 Constipation, unspecified: Secondary | ICD-10-CM

## 2023-05-05 DIAGNOSIS — E119 Type 2 diabetes mellitus without complications: Secondary | ICD-10-CM

## 2023-05-05 DIAGNOSIS — I10 Essential (primary) hypertension: Secondary | ICD-10-CM | POA: Diagnosis not present

## 2023-05-05 LAB — BASIC METABOLIC PANEL
Anion gap: 8 (ref 5–15)
BUN: 10 mg/dL (ref 8–23)
CO2: 24 mmol/L (ref 22–32)
Calcium: 8.8 mg/dL — ABNORMAL LOW (ref 8.9–10.3)
Chloride: 106 mmol/L (ref 98–111)
Creatinine, Ser: 0.93 mg/dL (ref 0.44–1.00)
GFR, Estimated: >60 mL/min (ref >60)
Glucose, Bld: 106 mg/dL — ABNORMAL HIGH (ref 70–99)
Potassium: 3.6 mmol/L (ref 3.5–5.1)
Sodium: 138 mmol/L (ref 135–145)

## 2023-05-05 LAB — CBC
HCT: 28 % — ABNORMAL LOW (ref 36.0–46.0)
Hemoglobin: 8.8 g/dL — ABNORMAL LOW (ref 12.0–15.0)
MCH: 27.5 pg (ref 26.0–34.0)
MCHC: 31.4 g/dL (ref 30.0–36.0)
MCV: 87.5 fL (ref 80.0–100.0)
Platelets: 377 10*3/uL (ref 150–400)
RBC: 3.2 MIL/uL — ABNORMAL LOW (ref 3.87–5.11)
RDW: 16.5 % — ABNORMAL HIGH (ref 11.5–15.5)
WBC: 7.9 10*3/uL (ref 4.0–10.5)
nRBC: 0 % (ref 0.0–0.2)

## 2023-05-05 NOTE — IPOC Note (Signed)
Overall Plan of Care [facility]) Patient Details Name: Peggy House MRN: [id number] DOB: [birth year]  Admitting Diagnosis: Ankle fracture  Hospital Problems: Principal Problem:   Ankle fracture     Functional Problem List: Nursing Pain, Bowel, Safety, Bladder, Endurance, Medication Management, Skin Integrity  PT Balance, Endurance, Motor, Pain, Safety  OT Balance, Endurance, Motor  SLP Cognition  TR         Basic ADL's: OT Bathing, Dressing, Toileting     Advanced  ADL's: OT       Transfers: PT Bed Mobility, Bed to Chair, Car  OT Tub/Shower     Locomotion: PT Ambulation, Wheelchair Mobility, Stairs     Additional Impairments: OT    SLP Social Cognition   Memory, Problem Solving  TR      Anticipated Outcomes Item Anticipated Outcome  Self Feeding s/u  Swallowing      Basic self-care  ModI  Toileting  ModI   Bathroom Transfers ModI  Bowel/Bladder  manage bowel w mod I and bladder w toileting  Transfers  CGA  Locomotion  CGA (anticipate mostly wheelchair level)  Communication     Cognition  Sup A  Pain  < 4 with prns  Safety/Judgment  manage safety w cues   Therapy Plan: PT Intensity: Minimum of 1-2 x/day ,45 to 90 minutes PT Frequency: 5 out of 7 days PT Duration Estimated Length of Stay: 1.5 - 2 weeks OT Intensity: Minimum of 1-2 x/day, 45 to 90 minutes OT Frequency: 5 out of 7 days, Total of 15 hours over 7 days of combined therapies OT Duration/Estimated Length of Stay: 1-2 weeks SLP Intensity: Minumum of 1-2 x/day, 30 to 90 minutes SLP Frequency: 1 to 3 out of 7 days SLP Duration/Estimated Length of Stay: 1.5- 2 weeks   Team Interventions: Nursing Interventions Patient/Family Education, Skin Care/Wound Management, Discharge Planning, Pain Management, Bladder Management, Bowel Management, Medication Management  PT interventions Ambulation/gait training, Discharge planning, Cognitive remediation/compensation, DME/adaptive equipment  instruction, Functional mobility training, Pain management, Psychosocial support, Splinting/orthotics, Therapeutic Activities, UE/LE Strength taining/ROM, Visual/perceptual remediation/compensation, Wheelchair propulsion/positioning, UE/LE Coordination activities, Stair training, Therapeutic Exercise, Patient/family education, Neuromuscular re-education, Functional electrical stimulation, Community reintegration, Warden/ranger, Disease management/prevention  OT Interventions Therapeutic Activities, UE/LE Strength taining/ROM, UE/LE Coordination activities, Therapeutic Exercise, Patient/family education, Self Care/advanced ADL retraining, Warden/ranger, Functional mobility training  SLP Interventions Cognitive remediation/compensation, Functional tasks, Patient/family education, Internal/external aids  TR Interventions    SW/CM Interventions Discharge Planning, Psychosocial Support, Patient/Family Education   Barriers to Discharge MD  Medical stability, Weight, and Weight bearing restrictions  Nursing Decreased caregiver support, Home environment access/layout, Weight bearing restrictions 1 level/level entry w son who works 3p-3a; sister to assist,pt reported using RW "off and on" for a few months  ADLs Comments: needed assist with dressing; sponge bathes at baseline  PT Decreased caregiver support, Home environment Best boy, Community education officer for SNF coverage, Weight, Weight bearing restrictions    OT      SLP      SW Decreased caregiver support, Lack of/limited family support, Community education officer for SNF coverage     Team Discharge Planning: Destination: PT-Home ,OT- Home , SLP-Home Projected Follow-up: PT-Home health PT, 24 hour supervision/assistance, OT-  Home health OT (to be determined prior to d/c), SLP-None Projected Equipment Needs: PT-To be determined, OT- 3 in 1 bedside comode, Rolling walker with 5" wheels, Tub/shower bench, SLP-None recommended by SLP Equipment  Details: PT- , OT-  Patient/family involved in discharge planning: PT- Patient,  OT-Patient, SLP-Patient  MD ELOS: 7-10d Medical Rehab Prognosis:  Good Assessment: The patient has been admitted for CIR therapies with the diagnosis of ankle fracture. The team will be addressing functional mobility, strength, stamina, balance, safety, adaptive techniques and equipment, self-care, bowel and bladder mgt, patient and caregiver education, chronic GI bleed, NWB LLE, morbid obesity . Goals have been set at mod I /Sup. Anticipated discharge destination is Home .  See Team Conference Notes for weekly updates to the plan of care

## 2023-05-05 NOTE — Progress Notes (Signed)
Patient ID: Peggy House, female   DOB: 26-Feb-1961, 63 y.o.   MRN: [id number]  [id number] left message with pt son Clide Cliff to introduce self, explain role, discuss discharge process, inform on ELOS, and discuss family edu. SW requested return phone call to confirm discharge plan, and discuss family edu and if his aunt Elita Quick is able to come in his place since he works split 2/3 shift. SW waiting on follow-up.   Cecile Sheerer, MSW, LCSW Office: [phone] Cell: [phone] Fax: [phone]

## 2023-05-05 NOTE — Care Management (Signed)
Inpatient Rehabilitation Center Individual Statement of Services  Patient Name:  Peggy House  Date:  05/05/2023  Welcome to the Inpatient Rehabilitation Center.  Our goal is to provide you with an individualized program based on your diagnosis and situation, designed to meet your specific needs.  With this comprehensive rehabilitation program, you will be expected to participate in at least 3 hours of rehabilitation therapies Monday-Friday, with modified therapy programming on the weekends.  Your rehabilitation program will include the following services:  Physical Therapy (PT), Occupational Therapy (OT), 24 hour per day rehabilitation nursing, Therapeutic Recreaction (TR), Psychology, Neuropsychology, Care Coordinator, Rehabilitation Medicine, Nutrition Services, Pharmacy Services, and Other  Weekly team conferences will be held on Wednesdays to discuss your progress.  Your Inpatient Rehabilitation Care Coordinator will talk with you frequently to get your input and to update you on team discussions.  Team conferences with you and your family in attendance may also be held.  Expected length of stay: 1-2 weeks    Overall anticipated outcome: Supervision  Depending on your progress and recovery, your program may change. Your Inpatient Rehabilitation Care Coordinator will coordinate services and will keep you informed of any changes. Your Inpatient Rehabilitation Care Coordinator's name and contact numbers are listed  below.  The following services may also be recommended but are not provided by the Inpatient Rehabilitation Center:  Driving Evaluations Home Health Rehabiltiation Services Outpatient Rehabilitation Services Vocational Rehabilitation   Arrangements will be made to provide these services after discharge if needed.  Arrangements include referral to agencies that provide these services.  Your insurance has been verified to be:  [employer]  Your primary  doctor is:  [name]  Pertinent information will be shared with your doctor and your insurance company.  Inpatient Rehabilitation Care Coordinator:  Susie Cassette [phone] or [phone]  Information discussed with and copy given to patient by: Gretchen Short, 05/05/2023, 9:40 AM

## 2023-05-05 NOTE — Progress Notes (Addendum)
PROGRESS NOTE   Subjective/Complaints: No new complaints or concerns this morning.  She had some questions for this social worker regarding finances. LBM 1/18  ROS:  Pain- controlled  Denies shortness of breath, chest pain, abdominal pain   Objective:   No results found. Recent Labs    05/05/23 0525  WBC 7.9  HGB 8.8*  HCT 28.0*  PLT 377   Recent Labs    05/05/23 0525  NA 138  K 3.6  CL 106  CO2 24  GLUCOSE 106*  BUN 10  CREATININE 0.93  CALCIUM 8.8*    Intake/Output Summary (Last 24 hours) at 05/05/2023 1149 Last data filed at 05/05/2023 0825 Gross per 24 hour  Intake 354 ml  Output --  Net 354 ml        Physical Exam: Vital Signs Blood pressure 136/64, pulse (!) 48, temperature 98.6 F (37 C), temperature source Oral, resp. rate 18, height 5\' 4"  (1.626 m), SpO2 100%. Gen: no distress, normal appearing, sitting in wheelchair HEENT: oral mucosa pink and moist, NCAT Cardiovascular:     Rate and Rhythm: Bradycardia present.  Pulmonary:     Effort: Pulmonary effort is normal.  Abdominal:     General: There is no distension.     Palpations: Abdomen is soft.  Musculoskeletal:        General: Swelling present. Normal range of motion.     Cervical back: Normal range of motion.     Comments: Right leg in splint  Skin:    General: Skin is warm.     Coloration: Skin is not jaundiced.     Comments: Skin scaly, dry in LE's  Neurological:     Mental Status: She is alert.     Comments: Alert and oriented x 3. Normal insight and awareness. Intact Memory. Normal language and speech. Cranial nerve exam unremarkable. MMT: UE 4/5 prox to 4+/5 distal. RLE- 2/5 (pain), able to wiggle toes. LLE 3/5 HF, KE and 4+/5 ADF/PF.  Sensory exam normal for light touch and pain in all 4 limbs. No limb ataxia or cerebellar signs. No abnormal tone appreciated.   Stable 1/20 Psychiatric:        Mood and Affect: Mood normal.         Behavior: Behavior normal.      Assessment/Plan: 1. Functional deficits which require 3+ hours per day of interdisciplinary therapy in a comprehensive inpatient rehab setting. Physiatrist is providing close team supervision and 24 hour management of active medical problems listed below. Physiatrist and rehab team continue to assess barriers to discharge/monitor patient progress toward functional and medical goals  Care Tool:  Bathing    Body parts bathed by patient: Right arm, Left arm, Chest, Abdomen, Front perineal area, Buttocks, Right upper leg, Left upper leg, Face (During the performance of simulated task with s/uA for UB and Mod/Max for coming from sit to stand for her perineal and bottom.)   Body parts bathed by helper: Left lower leg, Right lower leg     Bathing assist Assist Level: Minimal Assistance - Patient > 75% (Min to ModA for task performance)     Upper Body Dressing/Undressing Upper body dressing  What is the patient wearing?: Pull over shirt    Upper body assist Assist Level: Set up assist    Lower Body Dressing/Undressing Lower body dressing      What is the patient wearing?: Underwear/pull up, Pants     Lower body assist Assist for lower body dressing: Moderate Assistance - Patient 50 - 74% (Mod/MaxA for task involving sit to stand)     Toileting Toileting    Toileting assist Assist for toileting: Moderate Assistance - Patient 50 - 74% (ModA for bring threading the items onto her lets and pulling them up.)     Transfers Chair/bed transfer  Transfers assist     Chair/bed transfer assist level: Minimal Assistance - Patient > 75%     Locomotion Ambulation   Ambulation assist      Assist level: Minimal Assistance - Patient > 75% Assistive device: Walker-rolling Max distance: 3ft   Walk 10 feet activity   Assist           Walk 50 feet activity   Assist Walk 50 feet with 2 turns activity did not occur: Safety/medical  concerns         Walk 150 feet activity   Assist Walk 150 feet activity did not occur: Safety/medical concerns         Walk 10 feet on uneven surface  activity   Assist Walk 10 feet on uneven surfaces activity did not occur: Safety/medical concerns         Wheelchair     Assist Is the patient using a wheelchair?: Yes Type of Wheelchair: Manual    Wheelchair assist level: Supervision/Verbal cueing Max wheelchair distance: 150'    Wheelchair 50 feet with 2 turns activity    Assist        Assist Level: Supervision/Verbal cueing   Wheelchair 150 feet activity     Assist      Assist Level: Supervision/Verbal cueing   Blood pressure 136/64, pulse (!) 48, temperature 98.6 F (37 C), temperature source Oral, resp. rate 18, height 5\' 4"  (1.626 m), SpO2 100%.  Medical Problem List and Plan: 1. Functional deficits secondary to right trimalleolar ankle fx after syncopal episode.              -patient may shower if RLE is covered             -ELOS/Goals: 8-13 days,  mod I to supervision goals with PT and OT  Continue CIR  -Will ask social worker to speak with her   2.  Antithrombotics: -DVT/anticoagulation:  Mechanical:  Antiembolism stockings, knee (TED hose) Bilateral lower extremities (chemo prophy d/c due to epistaxis, anemia)             -antiplatelet therapy: none   3. Pain Management:  -continue Tylenol 1000 mg q 8 hours             -continue oxycodone 10 mg q 4 hours prn   4. Chronic depression/Insomnia: LCSW to evaluate and provide emotional support             -continue Celexa 20 mg daily             -trazodone 50 mg q HS prn sleep             -pt was on at bedtime remeron and xanax prn at home (not resumed)             -antipsychotic agents: n/a   5. Neuropsych/cognition: This patient is capable of  making decisions on her own behalf.   6. Skin/Wound Care: Routine skin care checks   7. 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 [facility]             -follow-up CBC             -goal to keep hgb >8   14: Intermittent epistaxis due to #11: - Afrin, other measures as needed             -consider ENT consultation for hemostasis              15: Right ankle fracture s/p ORIF 1/19, Dr. Hulda Humphrey (has nylon sutures and short leg splint with posterior slab and stirrups)             -NWB for 8-10 weeks             -follow-up ortho in 2-3 weeks post-op   16: Tobacco use: cessation counseling   17: Code status: DNR-limited       19: DM-2: A1c = 5.0% October 2024 (glucoses ~100-120s on regular diet)             -home metformin 500 mg BID not restarted             -d/c CBGs  D/c Boost  -Glucose stable on BMP today 1/20   20: Thrombocytosis: follow-up CBC   21: New onset nausea:             -pt reports that it came on after FFP yesterday afternoon.             -had large bm this morning with no improvement in symptoms              -continue with prn tigan, observe for now, belly is benign             -I don't see any other obvious causes the moment.   22. Diastolic hypotension: decrease lisinopril to 10mg  daily.  23. Systolic hypertension: increase magnesium gluconate to 500mg  HS, continue amlodipine 10mg  daily  -1/20 continue to monitor trend today      05/05/2023    3:16 AM 05/04/2023    7:26 PM 05/04/2023    1:27 PM  Vitals with BMI  Systolic 136 149 096  Diastolic 64 71 62   Pulse 48 54 51     24. Constipation: Last BM 1/17, d/c fleet enema, magnesium gluconate increased to 500mg  HS  -1/20 Continue Senokot, MiraLAX twice daily, sorbitol as needed.  Consider additional medication if no BM by tomorrow  25. Recent AKI: decrease lisinopril to 10mg  daily and repeat creatinine tomorrow.   -10/20 creatinine BUN stable at 0.93/10    LOS: 3 days A FACE TO FACE EVALUATION WAS  PERFORMED  Fanny Dance 05/05/2023, 11:49 AM

## 2023-05-05 NOTE — Progress Notes (Signed)
Occupational Therapy Session Note  Patient Details  Name: Peggy House MRN: [id number] Date of Birth: 07/25/1960  Today's Date: 05/05/2023 OT Individual Time: 4098-1191 OT Individual Time Calculation (min): 70 min    Short Term Goals: Week 1:  OT Short Term Goal 1 (Week 1): Patient will improve UB strength to 4/5MMT at 95% safe OT Short Term Goal 2 (Week 1): The pt will complete LB bathing and dressing at MinA at 95% safe OT Short Term Goal 3 (Week 1): The pt will complete functional t/f's to all surfaces with ModI using RW at 95% safe OT Short Term Goal 4 (Week 1): The pt will adhere to RLE precaution of NWB 100% of the time without v/c's or reminders. OT Short Term Goal 5 (Week 1): The pt will tolerate > 40 minutes of functional activity at 95% safe to improve her functional outcome.  Skilled Therapeutic Interventions/Progress Updates:  Skilled OT intervention completed with focus on tub/shower transfers, DME education, dynamic standing balance with unilateral UE support, toileting. Pt received seated in w/c, agreeable to session. No pain reported.  Pt politely declined shower this AM. Transported dependently in w/c > ADL bathroom. Pt reports bathroom set up as tub/shower with curtain. Discussed use of TTB for increasing safety with transferring in/out of shower and for conserving energy during bathing. Advised use of hand held shower head for ease of bathing. Demonstrated method of tucking shower curtain under buttocks to prevent water spillage in floor. Discussed using lateral leans for peri-washing . Pt was able to return demo Mod A sit > stand and mod A stand pivot transfer with RW from w/c <> TTB. Cues needed for body positioning, backing up to tub bench and w/c prior to sitting and safety however pt with good adherence to NWB on RLE.   Transported dependently > gym. Pt participated in the following dynamic standing balance and endurance tasks to promote independence and safety  during BADLs and functional mobility: -placing large pegs on pegboard at elevated table using Lt then removing with Rt hand. Pt with increased difficulty releasing RUE from RW and RLE NWB, and would benefit from continued practice in this area; seated rest needed for fatigue between trials. CGA needed overall for Lt side, CGA/min A for Rt sided balance with unilateral UE support  Back in room, pt completed min A sit > stand and stand pivot with grab bar > BSC over toilet. Dependent for lower clothing, then pt continent of urinary void and BM. Able to wipe seated with supervision, however utilized stedy for time constraint with min A sit > stand in stedy and OT assisted with pericare to ensure cleanliness. Mod A to donn clothing over hips. Dependent transfer > bed, then min A sit > stand from perched position in stedy > EOB. Transitioned sit > supine with supervision. RLE propped on pillows. Pt remained upright in bed, with bed alarm on/activated, and with all needs in reach at end of session.   Therapy Documentation Precautions:  Precautions Precautions: Fall Precaution Comments: RLE NWB Restrictions Weight Bearing Restrictions Per Provider Order: Yes RLE Weight Bearing Per Provider Order: Non weight bearing    Therapy/Group: Individual Therapy  Kelvin Sennett E Kyser Wandel, MS, OTR/L  05/05/2023, 11:10 AM

## 2023-05-05 NOTE — Progress Notes (Signed)
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   Past Surgical History:  Past Surgical History:  Procedure Laterality Date   ABDOMINAL HYSTERECTOMY     CARPAL TUNNEL RELEASE  05/13/2011   Procedure: CARPAL TUNNEL RELEASE;  Surgeon: Mable Paris, MD;  Location: [name];  Service: Orthopedics;  Laterality: Left;   COLONOSCOPY N/A 03/02/2020   Procedure: COLONOSCOPY;  Surgeon: Bernette Redbird, MD;  Location: [facility] ENDOSCOPY;  Service: Endoscopy;  Laterality: N/A;   COLONOSCOPY WITH PROPOFOL N/A 04/28/2014   Procedure: COLONOSCOPY WITH PROPOFOL;  Surgeon: Florencia Reasons, MD;  Location: [facility] ENDOSCOPY;  Service: Endoscopy;  Laterality: N/A;   DG TOES*L*  2/10   rt   DILATION AND CURETTAGE OF UTERUS     ENTEROSCOPY N/A 10/17/2017   Procedure: ENTEROSCOPY;  Surgeon: Kathi Der, MD;  Location: [facility]OSCOPY;  Service: Gastroenterology;  Laterality: N/A;   ESOPHAGOGASTRODUODENOSCOPY N/A 04/10/2014   Procedure: ESOPHAGOGASTRODUODENOSCOPY (EGD);  Surgeon: Shirley Friar, MD;  Location: [facility] ENDOSCOPY;  Service: Endoscopy;  Laterality: N/A;   ESOPHAGOGASTRODUODENOSCOPY N/A 05/10/2017   Procedure: ESOPHAGOGASTRODUODENOSCOPY (EGD);  Surgeon: Bernette Redbird, MD;  Location: [facility] ENDOSCOPY;  Service: Endoscopy;  Laterality: N/A;   ESOPHAGOGASTRODUODENOSCOPY N/A 09/22/2017    Procedure: ESOPHAGOGASTRODUODENOSCOPY (EGD);  Surgeon: Vida Rigger, MD;  Location: [facility] ENDOSCOPY;  Service: Endoscopy;  Laterality: N/A;  bedside   ESOPHAGOGASTRODUODENOSCOPY N/A 03/02/2020   Procedure: ESOPHAGOGASTRODUODENOSCOPY (EGD);  Surgeon: Bernette Redbird, MD;  Location: [facility];  Service: Endoscopy;  Laterality: N/A;   ESOPHAGOGASTRODUODENOSCOPY N/A 11/03/2020   Procedure: ESOPHAGOGASTRODUODENOSCOPY (EGD);  Surgeon: Charlott Rakes, MD;  Location: [facility] ENDOSCOPY;  Service: Endoscopy;  Laterality: N/A;   ESOPHAGOGASTRODUODENOSCOPY N/A 04/12/2022   Procedure: ESOPHAGOGASTRODUODENOSCOPY (EGD);  Surgeon: Vida Rigger, MD;  Location: [facility] ENDOSCOPY;  Service: Gastroenterology;  Laterality: N/A;   ESOPHAGOGASTRODUODENOSCOPY N/A 05/27/2022   Procedure: ESOPHAGOGASTRODUODENOSCOPY (EGD);  Surgeon: Willis Modena, MD;  Location: [facility] ENDOSCOPY;  Service: Gastroenterology;  Laterality: N/A;   ESOPHAGOGASTRODUODENOSCOPY N/A 09/27/2022   Procedure: ESOPHAGOGASTRODUODENOSCOPY (EGD);  Surgeon: Lynann Bologna, DO;  Location: [facility] ENDOSCOPY;  Service: Gastroenterology;  Laterality: N/A;   ESOPHAGOGASTRODUODENOSCOPY (EGD) WITH PROPOFOL N/A 04/27/2014   Procedure: ESOPHAGOGASTRODUODENOSCOPY (EGD) WITH PROPOFOL;  Surgeon: Florencia Reasons, MD;  Location: [facility] ENDOSCOPY;  Service: Endoscopy;  Laterality: N/A;  possible apc   ESOPHAGOGASTRODUODENOSCOPY (EGD) WITH PROPOFOL N/A 09/30/2017   Procedure: ESOPHAGOGASTRODUODENOSCOPY (EGD) WITH PROPOFOL;  Surgeon: Kerin Salen, MD;  Location: [facility] ENDOSCOPY;  Service: Gastroenterology;  Laterality: N/A;   ESOPHAGOGASTRODUODENOSCOPY (EGD) WITH PROPOFOL N/A 10/01/2017   Procedure: ESOPHAGOGASTRODUODENOSCOPY (EGD) WITH PROPOFOL;  Surgeon: Kerin Salen, MD;  Location: [facility] ENDOSCOPY;  Service: Gastroenterology;  Laterality: N/A;   ESOPHAGOGASTRODUODENOSCOPY (EGD) WITH PROPOFOL N/A 10/08/2017   Procedure: ESOPHAGOGASTRODUODENOSCOPY (EGD) WITH PROPOFOL;  Surgeon: Kathi Der, MD;   [facility]: [facility] ENDOSCOPY;  Service: Gastroenterology;  Laterality: N/A;   ESOPHAGOGASTRODUODENOSCOPY (EGD) WITH PROPOFOL N/A 10/17/2017   Procedure: ESOPHAGOGASTRODUODENOSCOPY (  EGD) WITH PROPOFOL;  Surgeon: Kathi Der, MD;  Location: [facility] ENDOSCOPY;  Service: Gastroenterology;  Laterality: N/A;   ESOPHAGOGASTRODUODENOSCOPY (EGD) WITH PROPOFOL N/A 10/19/2017   Procedure: ESOPHAGOGASTRODUODENOSCOPY (EGD) WITH PROPOFOL;  Surgeon: Kathi Der, MD;  [facility]OSCOPY;  Service: Gastroenterology;  Laterality: N/A;   ESOPHAGOGASTRODUODENOSCOPY (EGD) WITH PROPOFOL N/A 12/04/2018   Procedure: ESOPHAGOGASTRODUODENOSCOPY (EGD) WITH PROPOFOL;  Surgeon: Charlott Rakes, MD;  Location: [facility] ENDOSCOPY;  Service: Endoscopy;  Laterality: N/A;   GIVENS CAPSULE STUDY N/A 10/02/2017   Procedure: GIVENS CAPSULE STUDY;  Surgeon: Kerin Salen, MD;  Location: [facility] ENDOSCOPY;  Service: Gastroenterology;  Laterality: N/A;   GIVENS CAPSULE STUDY N/A 10/08/2017   Procedure: GIVENS CAPSULE STUDY;  Surgeon: Kathi Der, MD;  Location: [facility] ENDOSCOPY;  Service: Gastroenterology;  Laterality: N/A;  endoscopic placement of capsule   GIVENS CAPSULE STUDY N/A 03/02/2020   Procedure: GIVENS CAPSULE STUDY;  Surgeon: Bernette Redbird, MD;  Location: [facility] ENDOSCOPY;  Service: Endoscopy;  Laterality: N/A;   HEMOSTASIS CLIP PLACEMENT  12/04/2018   Procedure: HEMOSTASIS CLIP PLACEMENT;  Surgeon: Charlott Rakes, MD;  Location: [facility] ENDOS[facility];  Service: Endoscopy;;   HEMOSTASIS CLIP PLACEMENT  05/27/2022   Procedure: HEMOSTASIS CLIP PLACEMENT;  Surgeon: Willis Modena, MD;  Location: [facility];  Service: Gastroenterology;;   HEMOSTASIS CLIP PLACEMENT  09/27/2022   Procedure: HEMOSTASIS CLIP PLACEMENT;  Surgeon: Lynann Bologna, DO;  Location: [facility] ENDOSCOPY;  Service: Gastroenterology;;   HEMOSTASIS CONTROL  05/27/2022   Procedure: HEMOSTASIS CONTROL;  Surgeon: Willis Modena, MD;  Location[facility];  Service:  Gastroenterology;;   HOT HEMOSTASIS N/A 04/27/2014   Procedure: HOT HEMOSTASIS (ARGON PLASMA COAGULATION/BICAP);  Surgeon: Florencia Reasons, MD;  Location: [facility] ENDOSCOPY;  Service: Endoscopy;  Laterality: N/A;   HOT HEMOSTASIS N/A 09/30/2017   Procedure: HOT HEMOSTASIS (ARGON PLASMA COAGULATION/BICAP);  Surgeon: Kerin Salen, MD;  Location: [facility] ENDOSCOPY;  Service: Gastroenterology;  Laterality: N/A;   HOT HEMOSTASIS N/A 10/01/2017   Procedure: HOT HEMOSTASIS (ARGON PLASMA COAGULATION/BICAP);  Surgeon: Kerin Salen, MD;  Location: [facility] ENDOSCOPY;  Service: Gastroenterology;  Laterality: N/A;   HOT HEMOSTASIS N/A 10/17/2017   Procedure: HOT HEMOSTASIS (ARGON PLASMA COAGULATION/BICAP);  Surgeon: Kathi Der, MD;  Location: [facility] ENDOSCOPY;  Service: Gastroenterology;  Laterality: N/A;   HOT HEMOSTASIS N/A 10/19/2017   Procedure: HOT HEMOSTASIS (ARGON PLASMA COAGULATION/BICAP);  Surgeon: Kathi Der, MD;  Location: [facility] ENDOSCOPY;  Service: Gastroenterology;  Laterality: N/A;   HOT HEMOSTASIS N/A 03/02/2020   Procedure: HOT HEMOSTASIS (ARGON PLASMA COAGULATION/BICAP);  Surgeon: Bernette Redbird, MD;  Location: [facility] END[facility]COPY;  Service: Endoscopy;  Laterality: N/A;   HOT HEMOSTASIS N/A 04/12/2022   Procedure: HOT HEMOSTASIS (ARGON PLASMA COAGULATION/BICAP);  Surgeon: Vida Rigger, MD;  Location: [facility] ENDOSCOPY;  Service: Gastroenterology;  Laterality: N/A;   IR IMAGING GUIDED PORT INSERTION  07/08/2018   L shoulder Surgery  2011   ORIF ANKLE FRACTURE Right 04/24/2023   Procedure: OPEN REDUCTION INTERNAL FIXATION (ORIF) ANKLE FRACTURE;  Surgeon: Luci Bank, MD;  Location: [facility] OR;  Service: Orthopedics;  Laterality: Right;   POLYPECTOMY  03/02/2020   Procedure: POLYPECTOMY;  Surgeon: Bernette Redbird, MD;  Location: [facility] ENDOSCOPY;  Service: Endoscopy;;   SCLEROTHERAPY  11/03/2020   Procedure: Susa Day;  Surgeon: Charlott Rakes, MD;  Location: [facility] ENDOSCOPY;  Service: Endoscopy;;   SUBMUCOSAL INJECTION   09/22/2017   Procedure: SUBMUCOSAL INJECTION;  Surgeon: Vida Rigger, MD;  Location: [facility] ENDOSCOPY;  Service: Endoscopy;;   SUBMUCOSAL INJECTION  12/04/2018   Procedure: SUBMUCOSAL INJECTION;  Surgeon: Bosie Clos,  Oswaldo Done, MD;  Location: [facility];  Service: Endoscopy;;   Social History:  reports that she has been smoking cigarettes. She has a 15 pack-year smoking history. She quit smokeless tobacco use about 44 years ago.  Her smokeless tobacco use included snuff. She reports that she does not drink alcohol and does not use drugs.  Family / Support Systems Marital Status: Widow/Widower How Long?: since 2021; husband was hit by a car walking to the store Spouse/Significant Other: Widowed Children: 3 sons- Clide Cliff (youngest; lives with him and his girlfriend), Ivin Booty (married; lives in [place]), and Romie Minus (oldest; married and lives in [place]) Other Supports: sister [name] Anticipated Caregiver: sister pam Ability/Limitations of Caregiver: Pt reports her sister pam will provide supervision to her while her son is at work. Son works a spli 2nd/3rd shift. Caregiver Availability: Intermittent Family Dynamics: Pt lives with her son and his girlfriend. Pt plans to seek own housing.  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 She has been disabled for almost 20 years due to back issues. Education: high school grad Health Literacy - How often do you need to have someone help you when you read instructions, pamphlets, or other written material from your doctor or pharmacy?: Never Writes: Yes Employment Status: Disabled Date Retired/Disabled/Unemployed: 2005 Marine scientist Issues: Pt reports an incident over 20 years ago in which she was charged with a felony but it was dismissed Guardian/Conservator: No HCPOA   Abuse/Neglect Abuse/Neglect Assessment Can Be Completed: Yes Physical  Abuse: Denies Verbal Abuse: Denies Sexual Abuse: Denies Exploitation of patient/patient's resources: Denies Self-Neglect: Denies  Patient response to: Social Isolation - How often do you feel lonely or isolated from those around you?: Never  Emotional Status Pt's affect, behavior and adjustment status: Pt in good spirits at time of visit Recent Psychosocial Issues: Pt admits to depression and anxiety. Stress about current living situation. Psychiatric History: Reports PCP precsribes citalopram and xanax to help manage anxiety and depression. Substance Abuse History: Pt reports she smoke 1/2pk of cigarettes per day. She has not smoked since she has been in hospital. Denies etoh or rec drug use.  Patient / Family Perceptions, Expectations & Goals Pt/Family understanding of illness & functional limitations: Pt has a general understanding of her care needs Premorbid pt/family roles/activities: Independent; assistance with stairs and some self-care needs Anticipated changes in roles/activities/participation: Assistance with ADLs/IADLs Pt/family expectations/goals: pt goal is to work on mobility. getting healthier, and taking better care of herself.  Community Resources [employer]: None Premorbid Home Care/DME Agencies: None Transportation available at discharge: TBD Is the patient able to respond to transportation needs?: Yes In the past 12 months, has lack of transportation kept you from medical appointments or from getting medications?: No In the past 12 months, has lack of transportation kept you from meetings, work, or from getting things needed for daily living?: No Resource referrals recommended: Neuropsychology  Discharge Planning Living Arrangements: Alone Support Systems: Other relatives Type of Residence: Private residence Insurance Resources: Media planner (specify) ([place] Medicaid Health Blue) Financial Screen Referred: No Living Expenses: Rent Money Management:  Patient Does the patient have any problems obtaining your medications?: No Home Management: Pt manages all home care needs Patient/Family Preliminary Plans: TBD Care Coordinator Barriers to Discharge: Decreased caregiver support, Lack of/limited family support, Insurance for SNF coverage Care Coordinator Anticipated Follow Up Needs: HH/OP Expected length of stay: 1-2 weeks  Clinical Impression SW met  with pt at bedside to introduce self, explain role, discuss discharge process and inform on ELOS. Pt is not a Cytogeneticist. No HCPOA. DME: RW and 3in1 bSC. She is aware SW called her son and asked her to have him follow-up with SW.   Gretchen Short 05/05/2023, 2:48 PM

## 2023-05-05 NOTE — Progress Notes (Signed)
Inpatient Rehabilitation  Patient information reviewed and entered into eRehab system by Oyuki Hogan M. Eri Mcevers, M.A., CCC/SLP, PPS Coordinator.  Information including medical coding, functional ability and quality indicators will be reviewed and updated through discharge.    

## 2023-05-05 NOTE — Progress Notes (Signed)
Physical Therapy Session Note  Patient Details  Name: Peggy House MRN: [id number] Date of Birth: 19-Sep-1960  Today's Date: 05/05/2023 PT Individual Time: 0102-7253 and [id number] PT Individual Time Calculation (min): 71 min and 41 min  Short Term Goals: Week 1:  PT Short Term Goal 1 (Week 1): Pt will complete bed mobility with CGA PT Short Term Goal 2 (Week 1): Pt will complete bed<>chair transfers with minA and LRAD PT Short Term Goal 3 (Week 1): Pt will hop 53ft with minA and LRAD  Skilled Therapeutic Interventions/Progress Updates:   Treatment Session 1 Received pt semi-reclined in bed eating breakfast. Pt agreeable to PT treatment and reported pain 8/10 in R ankle - RN notified and present to adminster pain medication while therapist retrieved R elevating legrest for WC. Session with emphasis on functional mobility/transfers, dressing, generalized strengthening and endurance, dynamic standing balance/coordination, and gait training. Pt found to be incontinent of urine and transferred semi-reclined<>sitting R EOB with HOB elevated and use of bedrails with supervision and increased time/effort. Stood from EOB x 2 trials with RW and mod A and removed soiled brief, performed hygiene management dependently (although pt did attempt herself), and donned clean brief with max A. Donned pants sitting EOB with max A for time management purposes, then performed stand<>pivot into WC with RW and heavy min A. Of note, pt demonstrating good adherence to RLE NWB precautions in standing. Pt sat in WC at sink and brushed teeth/washed face with set up assist.   Pt then performed WC mobility 182ft using BUE and supervision with emphasis on BUE strength. Stood from [facility] with BUE support on RW and mod A and ambulated 36ft with RW and min A to fatigue - pt demonstrating decreased R foot clearance with increased fatigue. Of note, pt required frequent rest/water breaks throughout due to fatigue.Transitioned to  seated LLE strengthening on Kinetron at 20 cm/sec for 1 minute x 4 trials with therapist providing mnual counter resistance with emphasis on glute/quad strength. Discussed home set up and pt reports she lives in apartment with level entry and has a RW and reports WC will fit around home. Pt lives with her son and his girlfriend but they both work; her sister plans on coming to town and assisting her at discharge. Returned to room and concluded session with pt sitting in WC, needs within reach, and seatbelt alarm on. Pillows placed on elevating legrest for comfort.   Treatment Session 2 Received pt semi-reclined in bed finishing lunch. Pt agreeable to PT treatment and reported pain 8/10 in R ankle - RN notified and present to adminster medication. Session with emphasis on functional mobility/transfers, generalized strengthening and endurance, and standing balance/coordination. Pt requested to begin with bed level exercises and performed the following exercises with emphasis on LE strength/ROM: -SLR 2x10 bilaterally -hip abduction 2x10 bilaterally -hip adduction isometrics 10x5 second hold Pt breathing heavily and required rest/water breaks in between sets. Pt transferred semi-reclined<>sitting R EOB with HOB elevated and use of bedrails with supervision. Pt performed seated knee extension 2x10 bilaterally and hip flexion 2x10 bilaterally. Pt then stood from slightly elevated EOB with RW and mod A x 3 trials - cues to push up with 1UE from bed (pt demonstrating good adherence to RLE NWB precautions). Returned to supine with supervision and elevated RLE on pillows for edema management. Concluded session with pt semi-reclined in bed, needs within reach, and bed alarm on.   Therapy Documentation Precautions:  Precautions Precautions: Fall Precaution Comments: RLE NWB  Restrictions Weight Bearing Restrictions Per Provider Order: Yes RLE Weight Bearing Per Provider Order: Non weight bearing  Therapy/Group:  Individual Therapy Marlana Salvage Zaunegger Blima Rich PT, DPT 05/05/2023, 6:51 AM

## 2023-05-06 DIAGNOSIS — M25571 Pain in right ankle and joints of right foot: Secondary | ICD-10-CM | POA: Diagnosis not present

## 2023-05-06 DIAGNOSIS — S82891S Other fracture of right lower leg, sequela: Secondary | ICD-10-CM | POA: Diagnosis not present

## 2023-05-06 DIAGNOSIS — I10 Essential (primary) hypertension: Secondary | ICD-10-CM | POA: Diagnosis not present

## 2023-05-06 DIAGNOSIS — D62 Acute posthemorrhagic anemia: Secondary | ICD-10-CM

## 2023-05-06 DIAGNOSIS — K5900 Constipation, unspecified: Secondary | ICD-10-CM | POA: Diagnosis not present

## 2023-05-06 MED ORDER — SENNOSIDES-DOCUSATE SODIUM 8.6-50 MG PO TABS
1.0000 | ORAL_TABLET | Freq: Every day | ORAL | Status: DC
  Administered 2023-05-07 – 2023-05-18 (×7): 1 via ORAL
  Filled 2023-05-06 (×10): qty 1

## 2023-05-06 NOTE — Progress Notes (Signed)
PROGRESS NOTE   Subjective/Complaints: Patient working with therapy this morning.  Reports having some increased pain in her ankle.  She recently got a dose of her pain medication and starting to help.  She reports usually the pain medicine keeps the pain under fairly good control.  LBM 1/21  ROS:  Pain- controlled  Denies fevers, chills, shortness of breath, chest pain, abdominal pain   Objective:   No results found. Recent Labs    05/05/23 0525  WBC 7.9  HGB 8.8*  HCT 28.0*  PLT 377   Recent Labs    05/05/23 0525  NA 138  K 3.6  CL 106  CO2 24  GLUCOSE 106*  BUN 10  CREATININE 0.93  CALCIUM 8.8*    Intake/Output Summary (Last 24 hours) at 05/06/2023 1308 Last data filed at 05/05/2023 1854 Gross per 24 hour  Intake 360 ml  Output --  Net 360 ml        Physical Exam: Vital Signs Blood pressure (!) 142/66, pulse (!) 49, temperature 98.8 F (37.1 C), resp. rate 16, height 5\' 4"  (1.626 m), weight 102.7 kg, SpO2 97%. Gen: no distress, normal appearing, sitting in wheelchair working with therapy HEENT: oral mucosa pink and moist, NCAT Cardiovascular:     Rate and Rhythm: Bradycardia present.  Pulmonary:     Effort: Pulmonary effort is normal.  Abdominal:     General: There is no distension.     Palpations: Abdomen is soft.  Musculoskeletal:        General: Swelling present. Normal range of motion.     Cervical back: Normal range of motion.     Comments: Right leg in splint  Skin: No breakdown noted in the visible portion Neurological:     Mental Status: She is alert.     Comments: Alert and oriented x 3. Normal insight and awareness. Intact Memory. Normal language and speech. Cranial nerve exam unremarkable. MMT: UE 4/5 prox to 4+/5 distal. RLE- 2/5 (pain), able to wiggle toes. LLE 3/5 HF, KE and 4+/5 ADF/PF.  Sensory exam normal for light touch and pain in all 4 limbs. No limb ataxia or cerebellar  signs. No abnormal tone appreciated.   Stable 1/21 Psychiatric:        Mood and Affect: Mood normal.        Behavior: Behavior normal.      Assessment/Plan: 1. Functional deficits which require 3+ hours per day of interdisciplinary therapy in a comprehensive inpatient rehab setting. Physiatrist is providing close team supervision and 24 hour management of active medical problems listed below. Physiatrist and rehab team continue to assess barriers to discharge/monitor patient progress toward functional and medical goals  Care Tool:  Bathing    Body parts bathed by patient: Right arm, Left arm, Chest, Abdomen, Front perineal area, Buttocks, Right upper leg, Left upper leg, Face (During the performance of simulated task with s/uA for UB and Mod/Max for coming from sit to stand for her perineal and bottom.)   Body parts bathed by helper: Left lower leg, Right lower leg     Bathing assist Assist Level: Minimal Assistance - Patient > 75% (Min to ModA for task  performance)     Upper Body Dressing/Undressing Upper body dressing   What is the patient wearing?: Pull over shirt    Upper body assist Assist Level: Set up assist    Lower Body Dressing/Undressing Lower body dressing      What is the patient wearing?: Underwear/pull up, Pants     Lower body assist Assist for lower body dressing: Moderate Assistance - Patient 50 - 74% (Mod/MaxA for task involving sit to stand)     Toileting Toileting    Toileting assist Assist for toileting: Moderate Assistance - Patient 50 - 74% (ModA for bring threading the items onto her lets and pulling them up.)     Transfers Chair/bed transfer  Transfers assist     Chair/bed transfer assist level: Minimal Assistance - Patient > 75%     Locomotion Ambulation   Ambulation assist      Assist level: Minimal Assistance - Patient > 75% Assistive device: Walker-rolling Max distance: 52ft   Walk 10 feet activity   Assist  Walk 10  feet activity did not occur: Safety/medical concerns (Per PT Eval)        Walk 50 feet activity   Assist Walk 50 feet with 2 turns activity did not occur: Safety/medical concerns         Walk 150 feet activity   Assist Walk 150 feet activity did not occur: Safety/medical concerns         Walk 10 feet on uneven surface  activity   Assist Walk 10 feet on uneven surfaces activity did not occur: Safety/medical concerns         Wheelchair     Assist Is the patient using a wheelchair?: Yes Type of Wheelchair: Manual    Wheelchair assist level: Supervision/Verbal cueing Max wheelchair distance: 150'    Wheelchair 50 feet with 2 turns activity    Assist        Assist Level: Supervision/Verbal cueing   Wheelchair 150 feet activity     Assist      Assist Level: Supervision/Verbal cueing   Blood pressure (!) 142/66, pulse (!) 49, temperature 98.8 F (37.1 C), resp. rate 16, height 5\' 4"  (1.626 m), weight 102.7 kg, SpO2 97%.  Medical Problem List and Plan: 1. Functional deficits secondary to right trimalleolar ankle fx after syncopal episode.              -patient may shower if RLE is covered             -ELOS/Goals: 8-13 days,  mod I to supervision goals with PT and OT  Continue CIR PT, OT  -Team conference tomorrow   2.  Antithrombotics: -DVT/anticoagulation:  Mechanical:  Antiembolism stockings, knee (TED hose) Bilateral lower extremities (chemo prophy d/c due to epistaxis, anemia)             -antiplatelet therapy: none   3. Pain Management:  -continue Tylenol 1000 mg q 8 hours             -continue oxycodone 10 mg q 4 hours prn  -1/21 consider adjusting oxycodone to 5 to 10 mg every 4 hours as needed, will hold off on this change today   4. Chronic depression/Insomnia: LCSW to evaluate and provide emotional support             -continue Celexa 20 mg daily             -trazodone 50 mg q HS prn sleep             -  pt was on at bedtime  remeron and xanax prn at home (not resumed)             -antipsychotic agents: n/a   5. Neuropsych/cognition: This patient is capable of making decisions on her own behalf.   6. Skin/Wound Care: Routine skin care checks   7. 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 Pt [facility] almost [facility]             -follow-up CBC             -goal to keep hgb >8  -h/h stable   14: Intermittent epistaxis due to #11: - Afrin, other measures as needed             -consider ENT consultation for hemostasis              15: Right ankle fracture s/p ORIF 1/19, Dr. Hulda Humphrey (has nylon sutures and short leg splint with posterior slab and stirrups)             -NWB for 8-10 weeks             -follow-up ortho in 2-3 weeks post-op   16: Tobacco use: cessation counseling   17: Code status: DNR-limited       19: DM-2: A1c = 5.0% October 2024 (glucoses ~100-120s on regular diet)             -home metformin 500 mg BID not restarted             -d/c CBGs  D/c Boost  -Glucose stable on BMP today 1/20   20: Thrombocytosis: follow-up CBC   21: New onset nausea:             -pt reports that it came on after FFP yesterday afternoon.             -had large bm this morning with no  improvement in symptoms              -continue with prn tigan, observe for now, belly is benign             -I don't see any other obvious causes the moment.   22. Diastolic hypotension: decrease lisinopril to 10mg  daily.  23. Systolic hypertension: increase magnesium gluconate to 500mg  HS, continue amlodipine 10mg  daily  -1/20 continue to monitor trend today      05/06/2023   12:59 PM 05/06/2023    3:55 AM 05/05/2023  8:00 PM  Vitals with BMI  Systolic 142 148 604  Diastolic 66 64 54  Pulse 49 51 51     24. Constipation: Last BM 1/17, d/c fleet enema, magnesium gluconate increased to 500mg  HS  -1/20 Continue Senokot, MiraLAX twice daily, sorbitol as needed.  Consider additional medication if no BM by tomorrow  1/21 BM today improved, decrease Senokot frequency from twice a day to once a day  25. Recent AKI: decrease lisinopril to 10mg  daily and repeat creatinine tomorrow.   -10/20 creatinine BUN stable at 0.93/10    LOS: 4 days A FACE TO FACE EVALUATION WAS PERFORMED  Fanny Dance 05/06/2023, 1:08 PM

## 2023-05-06 NOTE — Plan of Care (Signed)
  Problem: RH Problem Solving Goal: LTG Patient will demonstrate problem solving for (SLP) Description: LTG:  Patient will demonstrate problem solving for basic/complex daily situations with cues  (SLP) Flowsheets (Taken 05/06/2023 1631) LTG: Patient will demonstrate problem solving for (SLP): Complex daily situations LTG Patient will demonstrate problem solving for: Supervision   Problem: RH Memory Goal: LTG Patient will use memory compensatory aids to (SLP) Description: LTG:  Patient will use memory compensatory aids to recall biographical/new, daily complex information with cues (SLP) Flowsheets (Taken 05/06/2023 1631) LTG: Patient will use memory compensatory aids to (SLP): Supervision

## 2023-05-06 NOTE — Evaluation (Signed)
Speech Language Pathology Assessment and Plan  Patient Details  Name: Peggy House MRN: [id number] Date of Birth: 28-Feb-1961  SLP Diagnosis: Cognitive Impairments  Rehab Potential: Excellent ELOS: 1.5- 2 weeks    Today's Date: 05/06/2023 SLP Individual Time: 0802-0900 SLP Individual Time Calculation (min): 58 min   Hospital Problem: Principal Problem:   Ankle fracture  Past Medical History:  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   Past Surgical History:  Past Surgical History:  Procedure Laterality Date   ABDOMINAL HYSTERECTOMY     CARPAL TUNNEL RELEASE  05/13/2011   Procedure: CARPAL TUNNEL RELEASE;  Surgeon: Mable Paris, MD;  Location: [facility];  Service: Orthopedics;  Laterality: Left;   COLONOSCOPY N/A 03/02/2020   Procedure: COLONOSCOPY;  Surgeon: Bernette Redbird, MD;  Location: [facility] ENDOSCOPY;  Service: Endoscopy;  Laterality: N/A;   COLONOSCOPY WITH PROPOFOL N/A 04/28/2014   Procedure: COLONOSCOPY WITH PROPOFOL;  Surgeon: Florencia Reasons, MD;  Location: [facility] ENDOSCOPY;  Service: Endoscopy;  Laterality: N/A;   DG TOES*L*  2/10   rt   DILATION AND CURETTAGE OF UTERUS     ENTEROSCOPY N/A 10/17/2017   Procedure: ENTEROSCOPY;  Surgeon: Kathi Der, MD;  Location: [facility] ENDOSCOPY;  Service: Gastroenterology;  Laterality: N/A;   ESOPHAGOGASTRODUODENOSCOPY N/A 04/10/2014   Procedure: ESOPHAGOGASTRODUODENOSCOPY (EGD);  Surgeon: Shirley Friar, MD;  Location: [facility] ENDOSCOPY;  Service: Endoscopy;  Laterality: N/A;   ESOPHAGOGASTRODUODENOSCOPY N/A 05/10/2017   Procedure: ESOPHAGOGASTRODUODENOSCOPY  (EGD);  Surgeon: Bernette Redbird, MD;  Location: [facility] ENDOSCOPY;  Service: Endoscopy;  Laterality: N/A;   ESOPHAGOGASTRODUODENOSCOPY N/A 09/22/2017   Procedure: ESOPHAGOGASTRODUODENOSCOPY (EGD);  Surgeon: Vida Rigger, MD;  Location: [facility] ENDOSCOPY;  Service: Endoscopy;  Laterality: N/A;  bedside   ESOPHAGOGASTRODUODENOSCOPY N/A 03/02/2020   Procedure: ESOPHAGOGASTRODUODENOSCOPY (EGD);  Surgeon: Bernette Redbird, MD;  Location: [facility] ENDOSCOPY;  Service: Endoscopy;  Laterality: N/A;   ESOPHAGOGASTRODUODENOSCOPY N/A 11/03/2020   Procedure: ESOPHAGOGASTRODUODENOSCOPY (EGD);  Surgeon: Charlott Rakes, MD;  Location: [facility] ENDOSCOPY;  Service: Endoscopy;  Laterality: N/A;   ESOPHAGOGASTRODUODENOSCOPY N/A 04/12/2022   Procedure: ESOPHAGOGASTRODUODENOSCOPY (EGD);  Surgeon: Vida Rigger, MD;  Location: [facility] ENDOSCOPY;  Service: Gastroenterology;  Laterality: N/A;   ESOPHAGOGASTRODUODENOSCOPY N/A 05/27/2022   Procedure: ESOPHAGOGASTRODUODENOSCOPY (EGD);  Surgeon: Willis Modena, MD;  Location: [facility] ENDOSCOPY;  Service: Gastroenterology;  Laterality: N/A;   ESOPHAGOGASTRODUODENOSCOPY N/A 09/27/2022   Procedure: ESOPHAGOGASTRODUODENOSCOPY (EGD);  Surgeon: Lynann Bologna, DO;  Location: [facility] ENDOSCOPY;  Service: Gastroenterology;  Laterality: N/A;   ESOPHAGOGASTRODUODENOSCOPY (EGD) WITH PROPOFOL N/A 04/27/2014   Procedure: ESOPHAGOGASTRODUODENOSCOPY (EGD) WITH PROPOFOL;  Surgeon: Florencia Reasons, MD;  Location: [facility] ENDOSCOPY;  Service: Endoscopy;  Laterality: N/A;  possible apc   ESOPHAGOGASTRODUODENOSCOPY (EGD) WITH PROPOFOL N/A 09/30/2017   Procedure: ESOPHAGOGASTRODUODENOSCOPY (EGD) WITH PROPOFOL;  Surgeon: Kerin Salen, MD;  Location: [facility] ENDOSCOPY;  Service: Gastroenterology;  Laterality: N/A;   ESOPHAGOGASTRODUODENOSCOPY (EGD) WITH PROPOFOL N/A 10/01/2017   Procedure: ESOPHAGOGASTRODUODENOSCOPY (EGD) WITH PROPOFOL;  Surgeon: Kerin Salen, MD;  Location: [facility] ENDOSCOPY;  Service: Gastroenterology;  Laterality: N/A;    ESOPHAGOGASTRODUODENOSCOPY (EGD) WITH PROPOFOL N/A 10/08/2017   Procedure: ESOPHAGOGASTRODUODENOSCOPY (EGD) WITH PROPOFOL;  Surgeon: Kathi Der, MD;  Location: [facility] ENDOSCOPY;  Service: Gastroenterology;  Laterality: N/A;   ESOPHAGOGASTRODUODENOSCOPY (EGD) WITH PROPOFOL N/A 10/17/2017   Procedure: ESOPHAGOGASTRODUODENOSCOPY (EGD) WITH PROPOFOL;  Surgeon: Kathi Der, MD;  Location: [facility] ENDOSCOPY;  Service: Gastroenterology;  Laterality: N/A;   ESOPHAGOGASTRODUODENOSCOPY (EGD) WITH PROPOFOL N/A 10/19/2017   Procedure: ESOPHAGOGASTRODUODENOSCOPY (EGD) WITH PROPOFOL;  Surgeon: Kathi Der, MD;  Location: [facility] ENDOSCOPY;  Service: Gastroenterology;  Laterality: N/A;   ESOPHAGOGASTRODUODENOSCOPY (EGD) WITH PROPOFOL N/A 12/04/2018   Procedure: ESOPHAGOGASTRODUODENOSCOPY (EGD) WITH PROPOFOL;  Surgeon: Charlott Rakes, MD;  Location: [facility] ENDOSCOPY;  Service: Endoscopy;  Laterality: N/A;   GIVENS CAPSULE STUDY N/A 10/02/2017   Procedure: GIVENS CAPSULE STUDY;  Surgeon: Kerin Salen, MD;  Location: [facility] ENDOSCOPY;  Service: Gastroenterology;  Laterality: N/A;   GIVENS CAPSULE STUDY N/A 10/08/2017   Procedure: GIVENS CAPSULE STUDY;  Surgeon: Kathi Der, MD;  Location: [facility] ENDOSCOPY;  Service: Gastroenterology;  Laterality: N/A;  endoscopic placement of capsule   GIVENS CAPSULE STUDY N/A 03/02/2020   Procedure: GIVENS CAPSULE STUDY;  Surgeon: Bernette Redbird, MD;  Location: [facility] ENDOSCOPY;  Service: Endoscopy;  Laterality: N/A;   HEMOSTASIS CLIP PLACEMENT  12/04/2018   Procedure: HEMOSTASIS CLIP PLACEMENT;  Surgeon: Charlott Rakes, MD;  Location: [facility] ENDOSCOPY;  Service: Endoscopy;;   HEMOSTASIS CLIP PLACEMENT  05/27/2022   Procedure: HEMOSTASIS CLIP PLACEMENT;  Surgeon: Willis Modena, MD;  Location: [facility] ENDOSCOPY;  Service: Gastroenterology;;   HEMOSTASIS CLIP PLACEMENT  09/27/2022   Procedure: HEMOSTASIS CLIP PLACEMENT;  Surgeon: Lynann Bologna, DO;  Location: [facility] ENDOSCOPY;  Service:  Gastroenterology;;   HEMOSTASIS CONTROL  05/27/2022   Procedure: HEMOSTASIS CONTROL;  Surgeon: Willis Modena, MD;  Location: [facility] ENDOSCOPY;  Service: Gastroenterology;;   HOT HEMOSTASIS N/A 04/27/2014   Procedure: HOT HEMOSTASIS (ARGON PLASMA COAGULATION/BICAP);  Surgeon: Florencia Reasons, MD;  Location: [facility] ENDOSCOPY;  Service: Endoscopy;  Laterality: N/A;   HOT HEMOSTASIS N/A 09/30/2017   Procedure: HOT HEMOSTASIS (ARGON PLASMA COAGULATION/BICAP);  Surgeon: Kerin Salen, MD;  Location: [facility] ENDOSCOPY;  Service: Gastroenterology;  Laterality: N/A;   HOT HEMOSTASIS N/A 10/01/2017   Procedure: HOT HEMOSTASIS (ARGON PLASMA COAGULATION/BICAP);  Surgeon: Kerin Salen, MD;  Location: [facility] ENDOSCOPY;  Service: Gastroenterology;  Laterality: N/A;   HOT HEMOSTASIS N/A 10/17/2017   Procedure: HOT HEMOSTASIS (ARGON PLASMA COAGULATION/BICAP);  Surgeon: Kathi Der, MD;  Location: [facility] ENDOSCOPY;  Service: Gastroenterology;  Laterality: N/A;   HOT HEMOSTASIS N/A 10/19/2017   Procedure: HOT HEMOSTASIS (ARGON PLASMA COAGULATION/BICAP);  Surgeon: Kathi Der, MD;  Location: [facility] ENDOSCOPY;  Service: Gastroenterology;  Laterality: N/A;   HOT HEMOSTASIS N/A 03/02/2020   Procedure: HOT HEMOSTASIS (ARGON PLASMA COAGULATION/BICAP);  Surgeon: Bernette Redbird, MD;  Location: [facility] ENDOSCOPY;  Service: Endoscopy;  Laterality: N/A;   HOT HEMOSTASIS N/A 04/12/2022   Procedure: HOT HEMOSTASIS (ARGON PLASMA COAGULATION/BICAP);  Surgeon: Vida Rigger, MD;  Location: [facility] ENDOSCOPY;  Service: Gastroenterology;  Laterality: N/A;   IR IMAGING GUIDED PORT INSERTION  07/08/2018   L shoulder Surgery  2011   ORIF ANKLE FRACTURE Right 04/24/2023   Procedure: OPEN REDUCTION INTERNAL FIXATION (ORIF) ANKLE FRACTURE;  Surgeon: Luci Bank, MD;  Location: [facility] OR;  Service: Orthopedics;  Laterality: Right;   POLYPECTOMY  03/02/2020   Procedure: POLYPECTOMY;  Surgeon: Bernette Redbird, MD;  Location: [facility] ENDOSCOPY;  Service: Endoscopy;;    SCLEROTHERAPY  11/03/2020   Procedure: Susa Day;  Surgeon: Charlott Rakes, MD;  Location: [facility] ENDOSCOPY;  Service: Endoscopy;;   SUBMUCOSAL INJECTION  09/22/2017   Procedure: SUBMUCOSAL INJECTION;  Surgeon: Vida Rigger, MD;  Location: [facility] ENDOSCOPY;  Service: Endoscopy;;   SUBMUCOSAL INJECTION  12/04/2018   Procedure: SUBMUCOSAL INJECTION;  Surgeon: Charlott Rakes, MD;  Location: [facility] ENDOSCOPY;  Service: Endoscopy;;    Assessment / Plan / Recommendation Clinical Impression  Peggy House is a 63 year old female with a history of HTT presented to the ED on 04/13/2023 reporting syncopal episode and fall resulting in right ankle fracture.  She exhibited profound anemia on lab work with a hemoglobin 5.1. She underwent ORIF by Dr. Hulda Humphrey on 1/09 and she is NWB for 6-8 weeks. She continued to have nose bleeds, per primary team, the risk of chemoppx for DVT outweighs the benefits. Xarelto discontinued and SCDs placed for DVT ppx. Transfused additional unit of pRBC for a total of 10 units since admission with one unit of FFP. Follows with Dr. Malachy Mood. Cardiac workup for syncope, echo showed grade 2 diastolic dysfunction.  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 s/p IR embolization of gastric artery branch vessels on 12/10/17, cauterization of stomach for severe GI bleed in 11/2018, and epinephrine injection for bleeding ulcer on 11/03/20. SPt able to stand pivot to [facility] with min A for RLE NWB and balance and dense cues for technique to pivot. Pt demonstrates difficulty increasing BUE support to pivot on LLE with tendency to hop on LLE, but is able to improve with cues and practice. The patient requires inpatient medicine and rehabilitation evaluations and services for ongoing dysfunction secondary to debility.Pt was admitted to CIR on 05/01/22.  Cognitive Linguistic: Pt presents with a mild cognitive impairment. COGNISTAT administered and revealed mild deficits in  calculations and moderate deficits in memory and identifying similarities. Informal assessment revealed ability to sequence steps in order to place dietary order, awareness of safety precautions, and adequate use of call button. Furthermore, pt able to verbalize recent medical hx along with PLOF. She thoroughly explained diagnosis of HTT and medical schedule. Pt reports managing her own medication and finances at baseline and upcoming plan to move into her own place. Pt appears to be functioning at cognitive baseline with pt self reporting history of memory deficits. SLP recommending skilled interventions addressing recall and problem solving due to high prior level of function and pt plan to transition to living independently.   Pt would benefit from skilled SLP services to maximize cognition in order to maximize her independence prior to discharge. SLP does not anticipate pt will require supervision or follow up SLP services at discharge.   Skilled Therapeutic Interventions          Informal assessment measures and COGNISTAT administered. Please see full report for additional details.     SLP Assessment  Patient will need skilled Speech Lanaguage Pathology Services during CIR admission    Recommendations  Oral Care Recommendations: Oral care BID Patient destination: Home Follow up Recommendations: None Equipment Recommended: None recommended by SLP    SLP Frequency 1 to 3 out of 7 days   SLP Duration  SLP Intensity  SLP Treatment/Interventions 1.5- 2 weeks  Minumum of 1-2 x/day, 30 to 90 minutes  Cognitive remediation/compensation;Functional tasks;Patient/family education;Internal/external aids    Pain Pain Assessment Pain Scale: 0-10 Pain Score: 3  Pain Type: Acute pain Pain Location: Ankle Pain Orientation: Right Pain Descriptors / Indicators: Aching;Sore  Prior Functioning Cognitive/Linguistic Baseline: Within functional limits Type of Home: Apartment  Lives With:  Son;Family Available Help at Discharge: Family;Available PRN/intermittently;Other (Comment) Vocation: On disability  SLP  Evaluation Cognition Overall Cognitive Status: No family/caregiver present to determine baseline cognitive functioning Arousal/Alertness: Awake/alert Orientation Level: Oriented X4 Year: 2025 Month: January Day of Week: Correct Attention: Focused;Sustained Focused Attention: Appears intact Sustained Attention: Appears intact Memory: Impaired Memory Impairment: Decreased recall of new information;Retrieval deficit Awareness: Appears intact Problem Solving: Appears intact Executive Function: Sequencing;Organizing Sequencing: Appears intact Organizing: Appears intact Safety/Judgment: Appears intact  Comprehension Auditory Comprehension Overall Auditory Comprehension: Appears within functional limits for tasks assessed Expression Expression Primary Mode of Expression: Verbal Verbal Expression Overall Verbal Expression: Appears within functional limits for tasks assessed Oral Motor Oral Motor/Sensory Function Overall Oral Motor/Sensory Function: Within functional limits  Care Tool Care Tool Cognition Ability to hear (with hearing aid or hearing appliances if normally used Ability to hear (with hearing aid or hearing appliances if normally used): 0. Adequate - no difficulty in normal conservation, social interaction, listening to TV   Expression of Ideas and Wants Expression of Ideas and Wants: 4. Without difficulty (complex and basic) - expresses complex messages without difficulty and with speech that is clear and easy to understand   Understanding Verbal and Non-Verbal Content Understanding Verbal and Non-Verbal Content: 4. Understands (complex and basic) - clear comprehension without cues or repetitions  Memory/Recall Ability Memory/Recall Ability : Current season;That he or she is in a hospital/hospital unit    Short Term Goals: Week 1: SLP Short Term  Goal 1 (Week 1): Patient will recall and utilize memory compensatory strategies given min multimodal A SLP Short Term Goal 2 (Week 1): Patient will demonstrate problem solving in moderately complex situations given min multimodal A  Refer to Care Plan for Long Term Goals  Recommendations for other services: None   Discharge Criteria: Patient will be discharged from SLP if patient refuses treatment 3 consecutive times without medical reason, if treatment goals not met, if there is a change in medical status, if patient makes no progress towards goals or if patient is discharged from hospital.  The above assessment, treatment plan, treatment alternatives and goals were discussed and mutually agreed upon: by patient  Renaee Munda 05/06/2023, 4:18 PM

## 2023-05-06 NOTE — Progress Notes (Signed)
Physical Therapy Session Note  Patient Details  Name: Peggy House MRN: [id number] Date of Birth: Oct 20, 1960  Today's Date: 05/06/2023 PT Individual Time: 3151-7616 PT Individual Time Calculation (min): 40 min   Short Term Goals: Week 1:  PT Short Term Goal 1 (Week 1): Pt will complete bed mobility with CGA PT Short Term Goal 2 (Week 1): Pt will complete bed<>chair transfers with minA and LRAD PT Short Term Goal 3 (Week 1): Pt will hop 51ft with minA and LRAD  Skilled Therapeutic Interventions/Progress Updates:   Pt received supine in bed awake and agreeable to therapy session. Supine>sitting R EOB, HOB partially elevated and using bedrail, with supervision and increased time/effort. Sitting EOB, supervision for balance but no instability noted, during mod A threading of LB clothing.   Sit>stand elevated EOB>RW with pt requiring multiple attempts to rise with mod A - cuing for increased anterior weight shift and maintaining R LE NWBing with pt demonstrating good compliance.  Standing with R UE support on RW and min A for balance while pulling pants up over hips mod A.  L stand pivot EOB>w/c using RW with min A for balance and pt having poor eccentric control when going to sit, but good ability to maintain R LE NWBing with small hops during the transfer.  B UE w/c propulsion ~111ft to dayroom with distant supervision. Pt set-up w/c for R stand pivot w/c>EOM with cuing for proper w/c positioning and management of w/c leg rests. R stand pivot w/c>EOM using RW with min A overall for balance and 2x tries to come to standing fully due to posterior lean and decreased ability to power up.  Pt requesting to work-on gait training during session.  Repeated sit<>stands from regular height mat to RW with mod A progressed to light min A for lifting - continued cuing for increased anterior trunk lean when rising and sitting to help with improved eccentric control - pt with good compliance with R  LE NWBing with only occasional cues.  Gait training 38ft using RW with min assist for lifting/balance and +2 w/c follow for safety (2x brief standing rest breaks). Pt demonstrating the following gait deviations with therapist providing the described cuing and facilitation for improvement:  - pt demos slow gait speed  - short hops forward with low foot clearance  - fatigues quickly with this activity - able to maintain R LE NWBing   Discussed with pt recommendation for her to D/C at a wheelchair level because ambulating with R LE NWBing precaution will not be functional due to the amount of energy consumed for the task as well as increased fall risk. Pt reports understanding of this, but may need reinforcement.   Transported back to room. R stand pivot W/C>EOB using RW with reinforced education on proper w/c set-up to patient and min A for lifting and light min A for steadying while turning, pt demos improving eccentric control when going to sit.   Sit>supine using bedrails with supervision. Pt left supported in bed with needs in reach, R LE supported on pillows, and bed alarm on.   Therapy Documentation Precautions:  Precautions Precautions: Fall Precaution Comments: RLE NWB Restrictions Weight Bearing Restrictions Per Provider Order: Yes RLE Weight Bearing Per Provider Order: Non weight bearing   Pain:  Pt doesn't complain of pain during session.    Therapy/Group: Individual Therapy  Ginny Forth , PT, DPT, NCS, CSRS 05/06/2023, 3:34 PM

## 2023-05-06 NOTE — Progress Notes (Signed)
Occupational Therapy Session Note  Patient Details  Name: Peggy House MRN: [id number] Date of Birth: 1961/03/10  Today's Date: 05/06/2023 OT Individual Time: 4098-1191 OT Individual Time Calculation (min): 40 min    Short Term Goals: Week 1:  OT Short Term Goal 1 (Week 1): Patient will improve UB strength to 4/5MMT at 95% safe OT Short Term Goal 2 (Week 1): The pt will complete LB bathing and dressing at MinA at 95% safe OT Short Term Goal 3 (Week 1): The pt will complete functional t/f's to all surfaces with ModI using RW at 95% safe OT Short Term Goal 4 (Week 1): The pt will adhere to RLE precaution of NWB 100% of the time without v/c's or reminders. OT Short Term Goal 5 (Week 1): The pt will tolerate > 40 minutes of functional activity at 95% safe to improve her functional outcome.  Skilled Therapeutic Interventions/Progress Updates:  Skilled OT intervention completed with focus on activity tolerance, functional transfers, ADL retraining. Pt received upright in bed, agreeable to session. 7/10 pain reported in Rt ankle; pre-medicated. OT offered rest breaks and repositioning throughout for pain reduction.  Pt reported feeling "woozy" this AM from the pain meds with suggestion of doing session in bed, however with gentle encouragement, pt agreeable to go to sink for ADLs.  Transitioned to EOB with distant supervision. From slightly elevated bed, pt needed max A to stand using RW, then min A stand pivot with RW > w/c with cues for body and RW positioning. Sitting at sink, pt completed UB bathing/dressing, and oral care with set up A however pt with + time needed due to slower nature.  Pt remained seated in w/c with ice pack applied to Rt ankle, with belt alarm on/activated, and with all needs in reach at end of session.   Therapy Documentation Precautions:  Precautions Precautions: Fall Precaution Comments: RLE NWB Restrictions Weight Bearing Restrictions Per Provider  Order: Yes RLE Weight Bearing Per Provider Order: Non weight bearing    Therapy/Group: Individual Therapy  Melvyn Novas, MS, OTR/L  05/06/2023, 12:34 PM

## 2023-05-07 DIAGNOSIS — I10 Essential (primary) hypertension: Secondary | ICD-10-CM | POA: Diagnosis not present

## 2023-05-07 DIAGNOSIS — R001 Bradycardia, unspecified: Secondary | ICD-10-CM

## 2023-05-07 DIAGNOSIS — S82891S Other fracture of right lower leg, sequela: Secondary | ICD-10-CM | POA: Diagnosis not present

## 2023-05-07 DIAGNOSIS — K5900 Constipation, unspecified: Secondary | ICD-10-CM | POA: Diagnosis not present

## 2023-05-07 DIAGNOSIS — M25571 Pain in right ankle and joints of right foot: Secondary | ICD-10-CM | POA: Diagnosis not present

## 2023-05-07 MED ORDER — OXYMETAZOLINE HCL 0.05 % NA SOLN
2.0000 | Freq: Four times a day (QID) | NASAL | Status: AC
  Administered 2023-05-07 – 2023-05-10 (×12): 2 via NASAL
  Filled 2023-05-07 (×2): qty 30

## 2023-05-07 NOTE — Progress Notes (Signed)
Physical Therapy Session Note  Patient Details  Name: Peggy House MRN: [id number] Date of Birth: 07/22/1960  Today's Date: 05/07/2023 PT Individual Time: 4782-9562 PT Individual Time Calculation (min): 40 min   Short Term Goals: Week 1:  PT Short Term Goal 1 (Week 1): Pt will complete bed mobility with CGA PT Short Term Goal 2 (Week 1): Pt will complete bed<>chair transfers with minA and LRAD PT Short Term Goal 3 (Week 1): Pt will hop 80ft with minA and LRAD  Skilled Therapeutic Interventions/Progress Updates:   Received pt sitting in WC, pt agreeable to PT treatment, and reported pain 8/10 in R ankle (premedicated but not due for more pain medication yet). Session with emphasis on functional mobility/transfers, generalized strengthening and endurance, WC mobility, and gait training. Pt performed WC mobility 124ft using BUE and supervision to dayroom with emphasis on BUE strength. Stood from [facility] with RW and mod A and ambulated ~36ft with RW and min A - limited by fatigue and noted decreased L foot clearance with fatigue. Transitioned to seated RLE active assisted heel slides 2x15 with emphasis on R knee ROM with caution to avoid placing weight through R foot. Pt required rest/water breaks throughout session due to fatigue from back to back therapies this morning. Transported back to room in [facility] dependently and concluded session with pt sitting in [facility] with all needs within reach. Elevated RLE on legrest with 2 pillows and secured with gait belt for comfort.   Therapy Documentation Precautions:  Precautions Precautions: Fall Precaution Comments: RLE NWB Restrictions Weight Bearing Restrictions Per Provider Order: Yes RLE Weight Bearing Per Provider Order: Non weight bearing  Therapy/Group: Individual Therapy Marlana Salvage Zaunegger Blima Rich PT, DPT 05/07/2023, 7:00 AM

## 2023-05-07 NOTE — Progress Notes (Signed)
Physical Therapy Session Note  Patient Details  Name: Peggy House MRN: [id number] Date of Birth: June 10, 1960  Today's Date: 05/06/2023 PT Individual Time:  4098-1191  PT Individual Time Calculation (min): 43 min  Short Term Goals: Week 1:  PT Short Term Goal 1 (Week 1): Pt will complete bed mobility with CGA PT Short Term Goal 2 (Week 1): Pt will complete bed<>chair transfers with minA and LRAD PT Short Term Goal 3 (Week 1): Pt will hop 84ft with minA and LRAD   Skilled Therapeutic Interventions/Progress Updates:  Patient supine in bed on entrance to room. Patient alert and agreeable to PT session. Relates recent events that brought her to hospital. Also relates pain (unrated) in RLE but has had pain medication in past few hrs and is not able to request more just yet.   Therapeutic Exercise: Pt performed the following supine exercises with vc/ tc for proper technique: SLR 2x10 bilaterally Min PT resistance in straight leg ab/adduction 1x10 bilaterally Raise into 90/90 with slow eccentric return 1x10 RLE Ankle pumps/ circles 1x10 each for LLE  Pt provided with rest and water breaks in between sets.   Patient supine in bed at end of session with brakes locked, bed alarm set, and all needs within reach.   Therapy Documentation Precautions:  Precautions Precautions: Fall Precaution Comments: RLE NWB Restrictions Weight Bearing Restrictions Per Provider Order: Yes RLE Weight Bearing Per Provider Order: Non weight bearing  Pain: Pt does relate pain at start of session that continues throughout. Unrated but is able to complete supine therex with rest breaks provided.    Therapy/Group: Individual Therapy  Loel Dubonnet PT, DPT, CSRS 05/06/2023, 5:44 PM

## 2023-05-07 NOTE — Progress Notes (Signed)
Physical Therapy Session Note  Patient Details  Name: Peggy House MRN: [id number] Date of Birth: [birth year]  Today's Date: 05/07/2023 PT Individual Time: 0926-1010 PT Individual Time Calculation (min): 44 min   Short Term Goals: Week 1:  PT Short Term Goal 1 (Week 1): Pt will complete bed mobility with CGA PT Short Term Goal 2 (Week 1): Pt will complete bed<>chair transfers with minA and LRAD PT Short Term Goal 3 (Week 1): Pt will hop 54ft with minA and LRAD  Skilled Therapeutic Interventions/Progress Updates: Pt presented in dayroom handoff from OT and agreeable to therapy. Session focused on activity tolerance, dynamic balance and general conditioning. Pt participated in corn hole while in standing performing reaching task with both L and R UE. Pt able to maintain NWB precautions however required x 1 seated rest break between bouts. Pt also participated in seated LE therex including LAQ (LLE)  2 x 10 4lb ankle weight, hamstring pulls 2 x 10 green theraband, hip ER green theraband 2 x 10, and hip abd 4lb ankle weight. Pt then completed ambulatory transfer to w/c with overall light minA (almost CGA) and then propelled to 4W hallway for general conditioning. MD meeting with pt in hallway for daily assessment with MD transporting pt back to room. Pt remained in w/c at end of session with call bell within reach and needs met.      Therapy Documentation Precautions:  Precautions Precautions: Fall Precaution Comments: RLE NWB Restrictions Weight Bearing Restrictions Per Provider Order: Yes RLE Weight Bearing Per Provider Order: Non weight bearing General:   Vital Signs: Therapy Vitals Temp: 97.9 F (36.6 C) Resp: 18 BP: (!) 160/58 Patient Position (if appropriate): Lying Oxygen Therapy SpO2: 100 % O2 Device: Room Air   Therapy/Group: Individual Therapy  Markelle Asaro 05/07/2023, 4:29 PM

## 2023-05-07 NOTE — Progress Notes (Signed)
PROGRESS NOTE   Subjective/Complaints: Coming back from physical therapy.  Reports continued leg pain however controlled with current medications.  She has about estimated discharge date, discussed we will have team meeting later today.  LBM 1/21  ROS:  Right ankle pain Denies fevers, chills, shortness of breath, chest pain, abdominal pain, headache   Objective:   No results found. Recent Labs    05/05/23 0525  WBC 7.9  HGB 8.8*  HCT 28.0*  PLT 377   Recent Labs    05/05/23 0525  NA 138  K 3.6  CL 106  CO2 24  GLUCOSE 106*  BUN 10  CREATININE 0.93  CALCIUM 8.8*    Intake/Output Summary (Last 24 hours) at 05/07/2023 1241 Last data filed at 05/07/2023 0900 Gross per 24 hour  Intake 714 ml  Output --  Net 714 ml        Physical Exam: Vital Signs Blood pressure 131/63, pulse (!) 41, temperature 98.6 F (37 C), resp. rate 17, height 5\' 4"  (1.626 m), weight 102.7 kg, SpO2 98%. Gen: no distress, normal appearing, sitting in wheelchair, back from physical therapy HEENT: oral mucosa pink and moist, NCAT, conjugate gaze Cardiovascular:     Rate and Rhythm: Bradycardia present.  Pulmonary:     Effort: Pulmonary effort is normal.  Abdominal:     General: There is no distension. + BS     Palpations: Abdomen is soft.  Musculoskeletal:        General: Swelling present. Normal range of motion.     Cervical back: Normal range of motion.     Comments: Right leg in splint  Skin: No breakdown noted in the visible portion Neurological:     Mental Status: She is alert.     Comments: Alert and oriented x 3. Normal insight and awareness. Intact Memory. Normal language and speech. Cranial nerve exam unremarkable. MMT: UE 4/5 prox to 4+/5 distal. RLE- 2/5 (pain), able to wiggle toes. LLE 3/5 HF, KE and 4+/5 ADF/PF.  Sensory exam normal for light touch and pain in all 4 limbs. No limb ataxia or cerebellar signs. No  abnormal tone appreciated.   Stable 1/22 Psychiatric:        Mood and Affect: Mood normal.        Behavior: Behavior normal.      Assessment/Plan: 1. Functional deficits which require 3+ hours per day of interdisciplinary therapy in a comprehensive inpatient rehab setting. Physiatrist is providing close team supervision and 24 hour management of active medical problems listed below. Physiatrist and rehab team continue to assess barriers to discharge/monitor patient progress toward functional and medical goals  Care Tool:  Bathing    Body parts bathed by patient: Right arm, Left arm, Chest, Abdomen, Front perineal area, Buttocks, Right upper leg, Left upper leg, Right lower leg, Left lower leg, Face   Body parts bathed by helper: Left lower leg, Right lower leg     Bathing assist Assist Level: Supervision/Verbal cueing     Upper Body Dressing/Undressing Upper body dressing   What is the patient wearing?: Pull over shirt    Upper body assist Assist Level: Set up assist  Lower Body Dressing/Undressing Lower body dressing      What is the patient wearing?: Underwear/pull up, Pants     Lower body assist Assist for lower body dressing: Minimal Assistance - Patient > 75%     Toileting Toileting    Toileting assist Assist for toileting: Moderate Assistance - Patient 50 - 74% (ModA for bring threading the items onto her lets and pulling them up.)     Transfers Chair/bed transfer  Transfers assist     Chair/bed transfer assist level: Minimal Assistance - Patient > 75%     Locomotion Ambulation   Ambulation assist      Assist level: Minimal Assistance - Patient > 75% Assistive device: Walker-rolling Max distance: 48ft   Walk 10 feet activity   Assist  Walk 10 feet activity did not occur: Safety/medical concerns (Per PT Eval)        Walk 50 feet activity   Assist Walk 50 feet with 2 turns activity did not occur: Safety/medical concerns          Walk 150 feet activity   Assist Walk 150 feet activity did not occur: Safety/medical concerns         Walk 10 feet on uneven surface  activity   Assist Walk 10 feet on uneven surfaces activity did not occur: Safety/medical concerns         Wheelchair     Assist Is the patient using a wheelchair?: Yes Type of Wheelchair: Manual    Wheelchair assist level: Supervision/Verbal cueing Max wheelchair distance: 150'    Wheelchair 50 feet with 2 turns activity    Assist        Assist Level: Supervision/Verbal cueing   Wheelchair 150 feet activity     Assist      Assist Level: Supervision/Verbal cueing   Blood pressure 131/63, pulse (!) 41, temperature 98.6 F (37 C), resp. rate 17, height 5\' 4"  (1.626 m), weight 102.7 kg, SpO2 98%.  Medical Problem List and Plan: 1. Functional deficits secondary to right trimalleolar ankle fx after syncopal episode.              -patient may shower if RLE is covered             -ELOS/Goals: 8-13 days,  mod I to supervision goals with PT and OT  Continue CIR PT, OT  -Team conference today please see physician documentation under team conference tab, met with team  to discuss problems,progress, and goals. Formulized individual treatment plan based on medical history, underlying problem and comorbidities.     2.  Antithrombotics: -DVT/anticoagulation:  Mechanical:  Antiembolism stockings, knee (TED hose) Bilateral lower extremities (chemo prophy d/c due to epistaxis, anemia)             -antiplatelet therapy: none   3. Pain Management:  -continue Tylenol 1000 mg q 8 hours             -continue oxycodone 10 mg q 4 hours prn  -1/22 continue current pain regimen for now   4. Chronic depression/Insomnia: LCSW to evaluate and provide emotional support             -continue Celexa 20 mg daily             -trazodone 50 mg q HS prn sleep             -pt was on at bedtime remeron and xanax prn at home (not resumed)              -  antipsychotic agents: n/a   5. Neuropsych/cognition: This patient is capable of making decisions on her own behalf.   6. Skin/Wound Care: Routine skin care checks   7. 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 Pt [facility]             -follow-up CBC             -goal to keep hgb >8  -h/h stable   14: Intermittent epistaxis due to #11: - Afrin, other measures as needed             -consider ENT consultation for hemostasis              15: Right ankle fracture s/p ORIF 1/19, Dr. Hulda Humphrey (has nylon sutures and short leg splint with posterior slab and stirrups)             -NWB for 8-10 weeks             -follow-up ortho in 2-3 weeks post-op   16: Tobacco use: cessation counseling   17: Code status: DNR-limited       19: DM-2: A1c = 5.0% October 2024 (glucoses ~100-120s on regular diet)             -home metformin 500 mg BID not restarted             -d/c CBGs  D/c Boost  -Glucose stable on BMP today 1/20   20: Thrombocytosis: follow-up CBC   21: New onset nausea:             -pt reports that it came on after FFP yesterday afternoon.             -had large bm this morning with no improvement in symptoms              -continue with prn  tigan, observe for now, belly is benign             -I don't see any other obvious causes the moment.   22. Diastolic hypotension: decrease lisinopril to 10mg  daily.  23. Systolic hypertension: increase magnesium gluconate to 500mg  HS, continue amlodipine 10mg  daily  -1/22 BP intermittently elevated but controlled this morning, continue to monitor      05/07/2023    5:41 AM 05/06/2023    7:17 PM 05/06/2023   12:59 PM  Vitals with BMI  Systolic 131 150 096  Diastolic 63 62 66  Pulse 41 48  49     24. Constipation: Last BM 1/17, d/c fleet enema, magnesium gluconate increased to 500mg  HS  -1/20 Continue Senokot, MiraLAX twice daily, sorbitol as needed.  Consider additional medication if no BM by tomorrow  1/21 BM today improved, decrease Senokot frequency from twice a day to once a day  1/22 LBM yesterday continue to monitor  25. Recent AKI: decrease lisinopril to 10mg  daily and repeat creatinine tomorrow.   -10/20 creatinine BUN stable at 0.93/10   26. Bradycardia  -Avoid negative chronotropic medications.  Will check EKG  LOS: 5 days A FACE TO FACE EVALUATION WAS PERFORMED  Fanny Dance 05/07/2023, 12:41 PM

## 2023-05-07 NOTE — Progress Notes (Signed)
Occupational Therapy Session Note  Patient Details  Name: Peggy House MRN: [id number] Date of Birth: 07-22-1960  {CHL IP REHAB OT TIME CALCULATIONS:[id number]}   Short Term Goals: Week 1:  OT Short Term Goal 1 (Week 1): Patient will improve UB strength to 4/5MMT at 95% safe OT Short Term Goal 2 (Week 1): The pt will complete LB bathing and dressing at MinA at 95% safe OT Short Term Goal 3 (Week 1): The pt will complete functional t/f's to all surfaces with ModI using RW at 95% safe OT Short Term Goal 4 (Week 1): The pt will adhere to RLE precaution of NWB 100% of the time without v/c's or reminders. OT Short Term Goal 5 (Week 1): The pt will tolerate > 40 minutes of functional activity at 95% safe to improve her functional outcome.  Skilled Therapeutic Interventions/Progress Updates:    Patient agreeable to participate in OT session. Reports *** pain level.   Patient participated in skilled OT session focusing on ***. Therapist facilitated/assessed/developed/educated/integrated/elicited *** in order to improve/facilitate/promote    Therapy Documentation Precautions:  Precautions Precautions: Fall Precaution Comments: RLE NWB Restrictions Weight Bearing Restrictions Per Provider Order: Yes RLE Weight Bearing Per Provider Order: Non weight bearing    Therapy/Group: Individual Therapy  Limmie Patricia, OTR/L,CBIS  Supplemental OT - [facility] and [facility] Secure Chat Preferred   05/07/2023, 9:39 PM

## 2023-05-07 NOTE — Progress Notes (Signed)
Occupational Therapy Session Note  Patient Details  Name: Peggy House MRN: [id number] Date of Birth: [birth year]  Today's Date: 05/07/2023 OT Individual Time: 5284-1324 & 1430-1510 OT Individual Time Calculation (min): 83 min & 40 min   Short Term Goals: Week 1:  OT Short Term Goal 1 (Week 1): Patient will improve UB strength to 4/5MMT at 95% safe OT Short Term Goal 2 (Week 1): The pt will complete LB bathing and dressing at MinA at 95% safe OT Short Term Goal 3 (Week 1): The pt will complete functional t/f's to all surfaces with ModI using RW at 95% safe OT Short Term Goal 4 (Week 1): The pt will adhere to RLE precaution of NWB 100% of the time without v/c's or reminders. OT Short Term Goal 5 (Week 1): The pt will tolerate > 40 minutes of functional activity at 95% safe to improve her functional outcome.  Skilled Therapeutic Interventions/Progress Updates:  Session 1 Skilled OT intervention completed with focus on ADL retraining, functional endurance, and mobility within a shower context. Pt received upright in bed, agreeable to session. Soreness reported in RLE; pre-medicated. OT offered rest breaks, repositioning and moist heat via shower for pain relief.  Pt completed dependent transfers in stedy with min A from elevated surfaces to stand in stedy, as well as sit > stands with min A using RW during dressing with CGA needed for dynamic balance. Good adherence to NWB of RLE noted however cueing needed for sequencing.  Transitioned to EOB with increased time and effort but supervision only. Stedy transfer > BSC in shower. Waterproof cover applied to RLE prior to shower. Pt was able to bathe all parts with distant supervision, at the seated level, with RLE propped on stool. Pt utilized long handled sponge to access BLE and periareas. Stedy transfer > w/c. Able to donn shirt/deo, complete oral care with set up A. Threaded LB clothing with use of reacher at the supervision level, but  assist needed in stance for over hips with min A. Donned TED hose to LLE for edema management per order, then grip sock over LLE with total A for time.  Pt ate a few bites of breakfast, then transported dependently in w/c > gym for time. Completed min A sit > stand and stand pivot > EOM using RW with weakness on LLE expressed but continued adherence to NWB on RLE.  Pt participated in the following dynamic standing balance and endurance tasks to promote independence and safety during BADLs and functional mobility: -cornhole toss activity. Min A sit > stand with RW, and Min A needed for balance. No LOB, however seated rest breaks provided for fatigue  Direct handoff to PTA at end of session with pt remaining sitting EOM. All needs met at end of session.  Session 2 Skilled OT intervention completed with focus on activity tolerance, functional transfers, cognition. Pt received semi supine in bed, agreeable to session. Unrated pain reported in Rt ankle; pre-medicated. OT offered rest breaks, repositioning throughout for pain reduction.  Transitioned to EOB with increased time and supervision. Mod A sit > stand using RW, with pt pulling on RW for balance with cues needed to avoid. Min A stand pivot using RW > w/c. Transported dependently in w/c <> gym.  Pt participated in the following activities in sitting to address cognitive strategies needed for independence and safety with BADL management: -Motor speed dot test- 1.50 sec, no difficulty -Trail Making A- 2:27 min, 5 errors -Trail Making B- 4:40 min,  14 errors, min/mod assist needed for sequencing; suggestive of cognitive impairment with higher level strategies  Back in room, pt completed heavy min A sit > stand using RW and stand pivot > EOB with poor descent control to bed with pt stating fatigue and the "leg wouldn't move." Min A for bed mobility to upright in bed with RLE propped on pillows. Doffed pants per request with total A. Pt remained  upright in bed, with bed alarm on/activated, and with all needs in reach at end of session.   Therapy Documentation Precautions:  Precautions Precautions: Fall Precaution Comments: RLE NWB Restrictions Weight Bearing Restrictions Per Provider Order: Yes RLE Weight Bearing Per Provider Order: Non weight bearing    Therapy/Group: Individual Therapy  Melvyn Novas, MS, OTR/L  05/07/2023, 3:16 PM

## 2023-05-07 NOTE — Plan of Care (Signed)
  Problem: Consults Goal: RH GENERAL PATIENT EDUCATION Description: See Patient Education module for education specifics. Outcome: Progressing   Problem: RH BOWEL ELIMINATION Goal: RH STG MANAGE BOWEL WITH ASSISTANCE Description: STG Manage Bowel with toileting Assistance. Outcome: Progressing Goal: RH STG MANAGE BOWEL W/MEDICATION W/ASSISTANCE Description: STG Manage Bowel with Medication with mod I Assistance. Outcome: Progressing   Problem: RH BLADDER ELIMINATION Goal: RH STG MANAGE BLADDER WITH ASSISTANCE Description: STG Manage Bladder With toileting Assistance Outcome: Progressing   Problem: RH SKIN INTEGRITY Goal: RH STG SKIN FREE OF INFECTION/BREAKDOWN Description: Manage skin w min assist Outcome: Progressing Goal: RH STG MAINTAIN SKIN INTEGRITY WITH ASSISTANCE Description: STG Maintain Skin Integrity With min Assistance. Outcome: Progressing Goal: RH STG ABLE TO PERFORM INCISION/WOUND CARE W/ASSISTANCE Description: STG Able To Perform Incision/Wound Care With min Assistance. Outcome: Progressing   Problem: RH SAFETY Goal: RH STG ADHERE TO SAFETY PRECAUTIONS W/ASSISTANCE/DEVICE Description: STG Adhere to Safety Precautions With cues  Assistance/Device. Outcome: Progressing   Problem: RH PAIN MANAGEMENT Goal: RH STG PAIN MANAGED AT OR BELOW PT'S PAIN GOAL Description: < 4 with prns Outcome: Progressing   Problem: RH KNOWLEDGE DEFICIT GENERAL Goal: RH STG INCREASE KNOWLEDGE OF SELF CARE AFTER HOSPITALIZATION Description: Patient and family will be able to manage care at discharge using educational resources for medication, skin care and dietary modification independently Outcome: Progressing

## 2023-05-08 DIAGNOSIS — K5900 Constipation, unspecified: Secondary | ICD-10-CM | POA: Diagnosis not present

## 2023-05-08 DIAGNOSIS — R001 Bradycardia, unspecified: Secondary | ICD-10-CM

## 2023-05-08 DIAGNOSIS — S82891S Other fracture of right lower leg, sequela: Secondary | ICD-10-CM | POA: Diagnosis not present

## 2023-05-08 DIAGNOSIS — S82891A Other fracture of right lower leg, initial encounter for closed fracture: Secondary | ICD-10-CM

## 2023-05-08 DIAGNOSIS — I10 Essential (primary) hypertension: Secondary | ICD-10-CM | POA: Diagnosis not present

## 2023-05-08 DIAGNOSIS — M25571 Pain in right ankle and joints of right foot: Secondary | ICD-10-CM | POA: Diagnosis not present

## 2023-05-08 MED ORDER — HYDRALAZINE HCL 10 MG PO TABS
10.0000 mg | ORAL_TABLET | Freq: Two times a day (BID) | ORAL | Status: DC
Start: 2023-05-08 — End: 2023-05-09
  Administered 2023-05-08 – 2023-05-09 (×2): 10 mg via ORAL
  Filled 2023-05-08 (×2): qty 1

## 2023-05-08 MED ORDER — AMLODIPINE BESYLATE 5 MG PO TABS: 5.0000 mg | ORAL_TABLET | Freq: Every day | ORAL | Status: DC

## 2023-05-08 MED ORDER — AMLODIPINE BESYLATE 2.5 MG PO TABS
2.5000 mg | ORAL_TABLET | Freq: Every day | ORAL | Status: DC
  Administered 2023-05-09: 2.5 mg via ORAL
  Filled 2023-05-08: qty 1

## 2023-05-08 NOTE — Plan of Care (Signed)
  Problem: Consults Goal: RH GENERAL PATIENT EDUCATION Description: See Patient Education module for education specifics. Outcome: Progressing   Problem: RH BOWEL ELIMINATION Goal: RH STG MANAGE BOWEL WITH ASSISTANCE Description: STG Manage Bowel with toileting Assistance. Outcome: Progressing Goal: RH STG MANAGE BOWEL W/MEDICATION W/ASSISTANCE Description: STG Manage Bowel with Medication with mod I Assistance. Outcome: Progressing   Problem: RH BLADDER ELIMINATION Goal: RH STG MANAGE BLADDER WITH ASSISTANCE Description: STG Manage Bladder With toileting Assistance Outcome: Progressing   Problem: RH SKIN INTEGRITY Goal: RH STG SKIN FREE OF INFECTION/BREAKDOWN Description: Manage skin w min assist Outcome: Progressing Goal: RH STG MAINTAIN SKIN INTEGRITY WITH ASSISTANCE Description: STG Maintain Skin Integrity With min Assistance. Outcome: Progressing Goal: RH STG ABLE TO PERFORM INCISION/WOUND CARE W/ASSISTANCE Description: STG Able To Perform Incision/Wound Care With min Assistance. Outcome: Progressing   Problem: RH SAFETY Goal: RH STG ADHERE TO SAFETY PRECAUTIONS W/ASSISTANCE/DEVICE Description: STG Adhere to Safety Precautions With cues  Assistance/Device. Outcome: Progressing   Problem: RH PAIN MANAGEMENT Goal: RH STG PAIN MANAGED AT OR BELOW PT'S PAIN GOAL Description: < 4 with prns Outcome: Progressing   Problem: RH KNOWLEDGE DEFICIT GENERAL Goal: RH STG INCREASE KNOWLEDGE OF SELF CARE AFTER HOSPITALIZATION Description: Patient and family will be able to manage care at discharge using educational resources for medication, skin care and dietary modification independently Outcome: Progressing

## 2023-05-08 NOTE — Patient Instructions (Signed)
1) Strengthening: Chest Pull - Resisted   Hold Theraband in front of body with hands about shoulder width a part. Pull band a part and back together slowly. Repeat __10-15__ times. Complete ___1_ set(s) per session.. Repeat ___1_ session(s) per day.  http://orth.exer.us/926   Copyright  VHI. All rights reserved.   2) PNF Strengthening: Resisted   Standing with resistive band around each hand, bring right arm up and away, thumb back. Repeat _10-15___ times per set. Do __1__ sets per session. Do __1__ sessions per day.    3) Resisted External Rotation: in Neutral - Bilateral   Sit or stand, tubing in both hands, elbows at sides, bent to 90, forearms forward. Pinch shoulder blades together and rotate forearms out. Keep elbows at sides. Repeat _10-15___ times per set. Do __1__ sets per session. Do _1___ sessions per day.  http://orth.exer.us/966   Copyright  VHI. All rights reserved.   4) PNF Strengthening: Resisted   Standing, hold resistive band above head. Bring right arm down and out from side. Repeat _10-15___ times per set. Do __1__ sets per session. Do __1__ sessions per day.  http://orth.exer.us/922   Copyright  VHI. All rights reserved.     ELASTIC BAND TRICEPS EXTENSION - SELF FIXATION  While seated, hold and fixate one end of an elastic band against your chest. Hold the other end with your opposite hand with your elbow bent and arm by your side.   Start by pulling the band downward so that the elbow goes from a bent position to a straightened position as shown. Return to starting position and repeat.  Complete 10-15 repetitions, 1 set.     Theraband Elbow Flexion  Loop the middle of the band around one foot  With arms straight by side and palms facing forward, hold onto each end of the band Bend arms bringing hands toward shoulders, keeping elbows by your side Return to starting position . Complete 10-15 repetitions, 1 set.

## 2023-05-08 NOTE — Progress Notes (Signed)
Physical Therapy Session Note  Patient Details  Name: Peggy House MRN: [id number] Date of Birth: 1960/07/06  Today's Date: 05/08/2023 PT Individual Time: 0800-0900 PT Individual Time Calculation (min): 60 min   Short Term Goals: Week 1:  PT Short Term Goal 1 (Week 1): Pt will complete bed mobility with CGA PT Short Term Goal 2 (Week 1): Pt will complete bed<>chair transfers with minA and LRAD PT Short Term Goal 3 (Week 1): Pt will hop 20ft with minA and LRAD  Skilled Therapeutic Interventions/Progress Updates: Pt presents semi-reclined in bed and agreeable to therapy.  Pt states soiled brief and requests sitting on EOB for change.  Pt transfers sup to sit w/ supervision and bed features.  Pt transferred sit to stand w/ mod to min A to doff soiled brief, charted incontinence in Flowsheets.  Pt able to perform front pericare but PT completed rear.  Clean brief pulled over hips and then scrub pants pulled up.  Pt returned to sitting on EOB 2/2 fatigue, pt maintaining NWB RLE.  Pt performed step-pivot w/ RW and min A.  Pt wheeled to dayroom for time conservation.  Pt performed LE there ex for increased strength.  Pt performed 2 x 10 LAQ, marching and then standing R knee flexion.  Pt requires seated rest breaks 2/2 pain and fatigue.  Pt returned to room and remained sitting in w/c w/ chair alarm on and all needs in reach.     Therapy Documentation Precautions:  Precautions Precautions: Fall Precaution Comments: RLE NWB Restrictions Weight Bearing Restrictions Per Provider Order: Yes RLE Weight Bearing Per Provider Order: Non weight bearing General:   Vital Signs:   Pain:7/10 Pain Assessment Pain Scale: 0-10 Pain Score: 7  Pain Type: Acute pain Pain Location: Ankle Pain Orientation: Right Pain Descriptors / Indicators: Aching Pain Intervention(s): Medication (See eMAR)    Therapy/Group: Individual Therapy  Lucio Edward 05/08/2023, 9:01 AM

## 2023-05-08 NOTE — Progress Notes (Signed)
PROGRESS NOTE   Subjective/Complaints: No new concerns or complaints this morning.  She is working in [employer]  Reports pain is under good control this morning.  LBM 1/22  ROS:  Right ankle pain-controlled Denies fevers, chills, shortness of breath, chest pain, abdominal pain, headache, insomnia   Objective:   No results found. No results for input(s): "WBC", "HGB", "HCT", "PLT" in the last 72 hours.  No results for input(s): "NA", "K", "CL", "CO2", "GLUCOSE", "BUN", "CREATININE", "CALCIUM" in the last 72 hours.   Intake/Output Summary (Last 24 hours) at 05/08/2023 0908 Last data filed at 05/07/2023 1320 Gross per 24 hour  Intake 237 ml  Output --  Net 237 ml        Physical Exam: Vital Signs Blood pressure (!) 142/73, pulse (!) 46, temperature 98.3 F (36.8 C), resp. rate 17, height 5\' 4"  (1.626 m), weight 102.7 kg, SpO2 99%. Gen: no distress, normal appearing, sitting in wheelchair, back from physical therapy HEENT: oral mucosa pink and moist, NCAT, conjugate gaze Cardiovascular:     Rate and Rhythm: Bradycardia present.  Pulmonary:     Effort: Pulmonary effort is normal.  Abdominal:     General: There is no distension. + BS     Palpations: Abdomen is soft.  Musculoskeletal:        General: Swelling present. Normal range of motion.     Cervical back: Normal range of motion.     Comments: Right leg in splint  Skin: No breakdown noted in the visible portion Neurological:     Mental Status: She is alert.     Comments: Alert and oriented x 3. Normal insight and awareness. Intact Memory. Normal language and speech. Cranial nerve exam unremarkable. MMT: UE 4/5 prox to 4+/5 distal. RLE- 2/5 (pain), able to wiggle toes. LLE 3/5 HF, KE and 4+/5 ADF/PF.  Sensory exam normal for light touch and pain in all 4 limbs. No limb ataxia or cerebellar signs. No abnormal tone appreciated.   Stable 1/22 Psychiatric:        Mood  and Affect: Mood normal.        Behavior: Behavior normal.      Assessment/Plan: 1. Functional deficits which require 3+ hours per day of interdisciplinary therapy in a comprehensive inpatient rehab setting. Physiatrist is providing close team supervision and 24 hour management of active medical problems listed below. Physiatrist and rehab team continue to assess barriers to discharge/monitor patient progress toward functional and medical goals  Care Tool:  Bathing    Body parts bathed by patient: Right arm, Left arm, Chest, Abdomen, Front perineal area, Buttocks, Right upper leg, Left upper leg, Right lower leg, Left lower leg, Face   Body parts bathed by helper: Left lower leg, Right lower leg     Bathing assist Assist Level: Supervision/Verbal cueing     Upper Body Dressing/Undressing Upper body dressing   What is the patient wearing?: Pull over shirt    Upper body assist Assist Level: Set up assist    Lower Body Dressing/Undressing Lower body dressing      What is the patient wearing?: Underwear/pull up, Pants     Lower body assist Assist  for lower body dressing: Minimal Assistance - Patient > 75%     Toileting Toileting    Toileting assist Assist for toileting: Moderate Assistance - Patient 50 - 74% (ModA for bring threading the items onto her lets and pulling them up.)     Transfers Chair/bed transfer  Transfers assist     Chair/bed transfer assist level: Minimal Assistance - Patient > 75%     Locomotion Ambulation   Ambulation assist      Assist level: Minimal Assistance - Patient > 75% Assistive device: Walker-rolling Max distance: 71ft   Walk 10 feet activity   Assist  Walk 10 feet activity did not occur: Safety/medical concerns (Per PT Eval)        Walk 50 feet activity   Assist Walk 50 feet with 2 turns activity did not occur: Safety/medical concerns         Walk 150 feet activity   Assist Walk 150 feet activity did not  occur: Safety/medical concerns         Walk 10 feet on uneven surface  activity   Assist Walk 10 feet on uneven surfaces activity did not occur: Safety/medical concerns         Wheelchair     Assist Is the patient using a wheelchair?: Yes Type of Wheelchair: Manual    Wheelchair assist level: Supervision/Verbal cueing Max wheelchair distance: 150'    Wheelchair 50 feet with 2 turns activity    Assist        Assist Level: Supervision/Verbal cueing   Wheelchair 150 feet activity     Assist      Assist Level: Supervision/Verbal cueing   Blood pressure (!) 142/73, pulse (!) 46, temperature 98.3 F (36.8 C), resp. rate 17, height 5\' 4"  (1.626 m), weight 102.7 kg, SpO2 99%.  Medical Problem List and Plan: 1. Functional deficits secondary to right trimalleolar ankle fx after syncopal episode.              -patient may shower if RLE is covered             -ELOS/Goals: 8-13 days,  mod I to supervision goals with PT and OT  Continue CIR PT, OT  -est DC 2/1    2.  Antithrombotics: -DVT/anticoagulation:  Mechanical:  Antiembolism stockings, knee (TED hose) Bilateral lower extremities (chemo prophy d/c due to epistaxis, anemia)             -antiplatelet therapy: none   3. Pain Management:  -continue Tylenol 1000 mg q 8 hours             -continue oxycodone 10 mg q 4 hours prn  -1/23 pain controlled, continue current    4. Chronic depression/Insomnia: LCSW to evaluate and provide emotional support             -continue Celexa 20 mg daily             -trazodone 50 mg q HS prn sleep             -pt was on at bedtime remeron and xanax prn at home (not resumed)             -antipsychotic agents: n/a   5. Neuropsych/cognition: This patient is capable of making decisions on her own behalf.   6. Skin/Wound Care: Routine skin care checks   7. 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 Pt is transfused almost weekly as an outpt at [facility]             -follow-up CBC             -goal to keep hgb >8  -h/h stable   14: Intermittent epistaxis due to #11: - Afrin, other measures as needed             -consider ENT consultation for hemostasis              15: Right ankle fracture s/p ORIF 1/19, Dr. Hulda Humphrey (has nylon sutures and short leg splint with posterior slab and stirrups)             -NWB for 8-10 weeks             -follow-up ortho in 2-3 weeks post-op   16: Tobacco use: cessation counseling   17: Code status: DNR-limited       19: DM-2: A1c = 5.0% October 2024 (glucoses ~100-120s on regular diet)             -home metformin 500 mg BID not restarted             -d/c CBGs  D/c Boost  -Glucose stable on BMP today 1/20   20: Thrombocytosis: follow-up CBC   21: New onset nausea:             -pt reports that it came on after FFP yesterday afternoon.             -had large bm this morning with no improvement in symptoms              -continue with prn tigan, observe for now, belly is benign             -I don't see any other obvious causes the moment.   22. Diastolic hypotension: decrease lisinopril to 10mg  daily.  23. Systolic hypertension: increase magnesium gluconate to 500mg  HS, continue amlodipine 10mg  daily  -1/22 BP intermittently  elevated but controlled this morning, continue to monitor  -1/23 Consider additional medication if remains elevated       05/08/2023    3:42 AM 05/07/2023    7:50 PM 05/07/2023    1:44 PM  Vitals with BMI  Systolic 142 151 096  Diastolic 73 69 58  Pulse 46 51      24. Constipation: Last BM 1/17, d/c fleet enema, magnesium gluconate increased to 500mg  HS  -1/20 Continue Senokot, MiraLAX twice daily, sorbitol as needed.  Consider additional medication if no BM by tomorrow  1/21 BM today improved, decrease Senokot frequency from  twice a day to once a day  1/23 LBM yesterday stable  25. Recent AKI: decrease lisinopril to 10mg  daily and repeat creatinine tomorrow.   -10/20 creatinine BUN stable at 0.93/10   26. Bradycardia  -1/23 Avoid negative chronotropic medications.  Will check EKG- bardycardia. Will call cardiology to discuss. Asymptomatic . Spoke with pharmacy-doesn't look like any meds contributing  LOS: 6 days A FACE TO FACE EVALUATION WAS PERFORMED  Fanny Dance 05/08/2023, 9:08 AM

## 2023-05-08 NOTE — Consult Note (Signed)
Referring Physician: Milinda Antis, PA-C/[place] Cristino Martes, MD  Peggy House is an 63 y.o. female.                       Chief Complaint: Bradycardia  HPI: 63 years old black female with hereditary hemorrhagic telangiectasia had syncopal episode with recurrent blood loss anemia and fall sustaining right ankle trimalleolar fracture. In rehab she has sinus bradycardia without symptoms. She follows with hematology oncology for her chronic anemia, receives iron injections.  She also receives bevacizumab (a monoclonal antibody against VEGF) every 2 weeks.   She otherwise denies chest pain, shortness of breath, abdominal pain, hematemesis, melena, hematochezia, constipation, diarrhea, or any other bleeding that she is aware of. Her vital signs are otherwise stable. She is on full dose of amlodipine.   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      Past Surgical History:  Procedure Laterality Date   ABDOMINAL HYSTERECTOMY     CARPAL TUNNEL RELEASE  05/13/2011   Procedure: CARPAL TUNNEL RELEASE;  Surgeon: Mable Paris, MD;  Location: [facility];  Service: Orthopedics;  Laterality: Left;   COLONOSCOPY N/A 03/02/2020   Procedure: COLONOSCOPY;  Surgeon: Bernette Redbird, MD;  Location: [facility] ENDOSCOPY;  Service: Endoscopy;  Laterality: N/A;   COLONOSCOPY WITH PROPOFOL N/A 04/28/2014   Procedure: COLONOSCOPY WITH PROPOFOL;  Surgeon: Florencia Reasons, MD;  Location: [facility] ENDOSCOPY;  Service: Endoscopy;  Laterality: N/A;   DG TOES*L*  2/10   rt   DILATION AND CURETTAGE OF UTERUS     ENTEROSCOPY N/A 10/17/2017   Procedure:  ENTEROSCOPY;  Surgeon: Kathi Der, MD;  Location: [facility] ENDOSCOPY;  Service: Gastroenterology;  Laterality: N/A;   ESOPHAGOGASTRODUODENOSCOPY N/A 04/10/2014   Procedure: ESOPHAGOGASTRODUODENOSCOPY (EGD);  Surgeon: Shirley Friar, MD;  Location[facility] ENDOSCOPY;  Service: Endoscopy;  Laterality: N/A;   ESOPHAGOGASTRODUODENOSCOPY N/A 05/10/2017   Procedure: ESOPHAGOGASTRODUODENOSCOPY (EGD);  Surgeon: Bernette Redbird, MD;  Location: [facility] ENDOSCOPY;  Service: Endoscopy;  Laterality: N/A;   ESOPHAGOGASTRODUODENOSCOPY N/A 09/22/2017   Procedure: ESOPHAGOGASTRODUODENOSCOPY (EGD);  Surgeon: Vida Rigger, MD;  Location: [facility] ENDOSCOPY;  Service: Endoscopy;  Laterality: N/A;  bedside   ESOPHAGOGASTRODUODENOSCOPY N/A 03/02/2020   Procedure: ESOPHAGOGASTRODUODENOSCOPY (EGD);  Surgeon: Bernette Redbird, MD;  Location: [facility];  Service: Endoscopy;  Laterality: N/A;   ESOPHAGOGASTRODUODENOSCOPY N/A 11/03/2020   Procedure: ESOPHAGOGASTRODUODENOSCOPY (EGD);  Surgeon: Charlott Rakes, MD;  Location: [facility] ENDOSCOPY;  Service: Endoscopy;  Laterality: N/A;   ESOPHAGOGASTRODUODENOSCOPY N/A 04/12/2022   Procedure: ESOPHAGOGASTRODUODENOSCOPY (EGD);  Surgeon: Vida Rigger, MD;  Location: [facility]COPY;  Service: Gastroenterology;  Laterality: N/A;   ESOPHAGOGASTRODUODENOSCOPY N/A 05/27/2022   Procedure: ESOPHAGOGASTRODUODENOSCOPY (EGD);  Surgeon: Willis Modena, MD;  Location: [facility] ENDOSCOPY;  Service: Gastroenterology;  Laterality: N/A;   ESOPHAGOGASTRODUODENOSCOPY N/A 09/27/2022   Procedure: ESOPHAGOGASTRODUODENOSCOPY (EGD);  Surgeon: Lynann Bologna, DO;  Location: [facility] ENDOSCOPY;  Service: Gastroenterology;  Laterality: N/A;   ESOPHAGOGASTRODUODENOSCOPY (EGD) WITH PROPOFOL N/A 04/27/2014   Procedure: ESOPHAGOGASTRODUODENOSCOPY (EGD) WITH PROPOFOL;  Surgeon: Florencia Reasons, MD;  Location: [facility] ENDOSCOPY;  Service: Endoscopy;  Laterality: N/A;  possible  apc   ESOPHAGOGASTRODUODENOSCOPY (EGD) WITH PROPOFOL N/A 09/30/2017    Procedure: ESOPHAGOGASTRODUODENOSCOPY (EGD) WITH PROPOFOL;  Surgeon: Kerin Salen, MD;  Location: [facility] ENDOSCOPY;  Service: Gastroenterology;  Laterality: N/A;   ESOPHAGOGASTRODUODENOSCOPY (EGD) WITH PROPOFOL N/A 10/01/2017   Procedure: ESOPHAGOGASTRODUODENOSCOPY (EGD) WITH PROPOFOL;  Surgeon: Kerin Salen, MD;  Location: [facility] ENDOSCOPY;  Service: Gastroenterology;  Laterality: N/A;   ESOPHAGOGASTRODUODENOSCOPY (EGD) WITH PROPOFOL N/A 10/08/2017   Procedure: ESOPHAGOGASTRODUODENOSCOPY (EGD) WITH PROPOFOL;  Surgeon: Kathi Der, MD;  Location: [facility] ENDOSCOPY;  Service: Gastroenterology;  Laterality: N/A;   ESOPHAGOGASTRODUODENOSCOPY (EGD) WITH PROPOFOL N/A 10/17/2017   Procedure: ESOPHAGOGASTRODUODENOSCOPY (EGD) WITH PROPOFOL;  Surgeon: Kathi Der, MD;  Location: [facility] ENDOSCOPY;  Service: Gastroenterology;  Laterality: N/A;   ESOPHAGOGASTRODUODENOSCOPY (EGD) WITH PROPOFOL N/A 10/19/2017   Procedure: ESOPHAGOGASTRODUODENOSCOPY (EGD) WITH PROPOFOL;  Surgeon: Kathi Der, MD;  Location: [facility] ENDOSCOPY;  Service: Gastroenterology;  Laterality: N/A;   ESOPHAGOGASTRODUODENOSCOPY (EGD) WITH PROPOFOL N/A 12/04/2018   Procedure: ESOPHAGOGASTRODUODENOSCOPY (EGD) WITH PROPOFOL;  Surgeon: Charlott Rakes, MD;  Location: [facility] ENDOSCOPY;  Service: Endoscopy;  Laterality: N/A;   GIVENS CAPSULE STUDY N/A 10/02/2017   Procedure: GIVENS CAPSULE STUDY;  Surgeon: Kerin Salen, MD;  Location: [facility] ENDOSCOPY;  Service: Gastroenterology;  Laterality: N/A;   GIVENS CAPSULE STUDY N/A 10/08/2017   Procedure: GIVENS CAPSULE STUDY;  Surgeon: Kathi Der, MD;  Location: [facility] ENDOSCOPY;  Service: Gastroenterology;  Laterality: N/A;  endoscopic placement of capsule   GIVENS CAPSULE STUDY N/A 03/02/2020   Procedure: GIVENS CAPSULE STUDY;  Surgeon: Bernette Redbird, MD;  Location: [facility] ENDOSCOPY;  Service: Endoscopy;  Laterality: N/A;   HEMOSTASIS CLIP PLACEMENT  12/04/2018   Procedure: HEMOSTASIS CLIP PLACEMENT;  Surgeon: Charlott Rakes, MD;  Location: [facility] ENDOSCOPY;  Service: Endoscopy;;   HEMOSTASIS CLIP PLACEMENT  05/27/2022   Procedure: HEMOSTASIS CLIP PLACEMENT;  Surgeon: Willis Modena, MD;  Location: [facility] ENDOSCOPY;  Service: Gastroenterology;;   HEMOSTASIS CLIP PLACEMENT  09/27/2022   Procedure: HEMOSTASIS CLIP PLACEMENT;  Surgeon: Lynann Bologna, DO;  Location: [facility] ENDOSCOPY;  Service: Gastroenterology;;   HEMOSTASIS CONTROL  05/27/2022   Procedure: HEMOSTASIS CONTROL;  Surgeon: Willis Modena, MD;  Location: [facility] ENDOSCOPY;  Service: Gastroenterology;;   HOT HEMOSTASIS N/A 04/27/2014   Procedure: HOT HEMOSTASIS (ARGON PLASMA COAGULATION/BICAP);  Surgeon: Florencia Reasons, MD;  Location: [facility] ENDOSCOPY;  Service: Endoscopy;  Laterality: N/A;   HOT HEMOSTASIS N/A 09/30/2017   Procedure: HOT HEMOSTASIS (ARGON PLASMA COAGULATION/BICAP);  Surgeon: Kerin Salen, MD;  Location: [facility] ENDOSCOPY;  Service: Gastroenterology;  Laterality: N/A;   HOT HEMOSTASIS N/A 10/01/2017   Procedure: HOT HEMOSTASIS (ARGON PLASMA COAGULATION/BICAP);  Surgeon: Kerin Salen, MD;  Location: [facility] ENDOSCOPY;  Service: Gastroenterology;  Laterality: N/A;   HOT HEMOSTASIS N/A 10/17/2017   Procedure: HOT HEMOSTASIS (ARGON PLASMA COAGULATION/BICAP);  Surgeon: Kathi Der, MD;  Location: [facility] ENDOSCOPY;  Service: Gastroenterology;  Laterality: N/A;   HOT HEMOSTASIS N/A 10/19/2017   Procedure: HOT HEMOSTASIS (ARGON PLASMA COAGULATION/BICAP);  Surgeon: Kathi Der, MD;  Location: [facility] ENDOSCOPY;  Service: Gastroenterology;  Laterality: N/A;   HOT HEMOSTASIS N/A 03/02/2020   Procedure: HOT HEMOSTASIS (ARGON PLASMA COAGULATION/BICAP);  Surgeon: Bernette Redbird, MD;  Location: [facility];  Service: Endoscopy;  Laterality: N/A;   HOT HEMOSTASIS N/A 04/12/2022   Procedure: HOT HEMOSTASIS (ARGON PLASMA COAGULATION/BICAP);  Surgeon: Vida Rigger, MD;  Location: [facility];  Service: Gastroenterology;  Laterality: N/A;   IR IMAGING GUIDED PORT INSERTION   07/08/2018   L shoulder Surgery  2011   ORIF ANKLE FRACTURE Right 04/24/2023  Procedure: OPEN REDUCTION INTERNAL FIXATION (ORIF) ANKLE FRACTURE;  Surgeon: Luci Bank, MD;  Location: [facility] OR;  Service: Orthopedics;  Laterality: Right;   POLYPECTOMY  03/02/2020   Procedure: POLYPECTOMY;  Surgeon: Bernette Redbird, MD;  Location: [facility] ENDOSCOPY;  Service: Endoscopy;;   SCLEROTHERAPY  11/03/2020   Procedure: Susa Day;  Surgeon: Charlott Rakes, MD;  Location: [facility] ENDOSCOPY;  Service: Endoscopy;;   SUBMUCOSAL INJECTION  09/22/2017   Procedure: SUBMUCOSAL INJECTION;  Surgeon: Vida Rigger, MD;  Location: [facility] ENDOSCOPY;  Service: Endoscopy;;   SUBMUCOSAL INJECTION  12/04/2018   Procedure: SUBMUCOSAL INJECTION;  Surgeon: Charlott Rakes, MD;  Location: [facility] ENDOSCOPY;  Service: Endoscopy;;    Family History  Problem Relation Age of Onset   Cancer Mother        Ovarian   Ovarian cancer Mother    Diabetes Mother    Kidney disease Mother    Bleeding Disorder Mother    Diabetes type II Sister    Bleeding Disorder Sister    Diabetes Sister    Colon cancer Maternal Grandfather    Crohn's disease Maternal Grandfather    Stomach cancer Maternal Grandmother    Kidney disease Son    Bleeding Disorder Son    Dysmenorrhea Neg Hx    Social History:  reports that she has been smoking cigarettes. She has a 15 pack-year smoking history. She quit smokeless tobacco use about 44 years ago.  Her smokeless tobacco use included snuff. She reports that she does not drink alcohol and does not use drugs.  Allergies:  Allergies  Allergen Reactions   Feraheme [Ferumoxytol] Other (See Comments)    Muscle tetany, encephalopathy, fatigue, nausea (reaction to injection)   Nsaids Other (See Comments)    Stomach bleeding episodes; Tylenol is OK   Tomato Hives   Iron (Ferrous Sulfate) [Ferrous Sulfate Er] Other (See Comments)    Shock   Wasp Venom Swelling    Medications Prior to Admission  Medication Sig  Dispense Refill   Accu-Chek Softclix Lancets lancets Use to check blood sugar before breakfast and before dinner while on steroids (Patient taking differently: 1 each by Other route See admin instructions. Use to check blood sugar before breakfast and before dinner while on steroids) 100 each 1   acetaminophen (TYLENOL) 500 MG tablet Take 2 tablets (1,000 mg total) by mouth every 8 (eight) hours.     ALPRAZolam (XANAX) 0.5 MG tablet TAKE 1 TABLET BY MOUTH AT BEDTIME AS NEEDED FOR ANXIETY OR SLEEP 7 tablet 0   aminocaproic acid (AMICAR) 500 MG tablet Take 2 tablets (1,000 mg total) by mouth every 12 (twelve) hours. 120 tablet 3   amLODipine (NORVASC) 10 MG tablet TAKE 1 TABLET(10 MG) BY MOUTH DAILY (Patient taking differently: Take 10 mg by mouth daily.) 90 tablet 3   citalopram (CELEXA) 20 MG tablet Take 1 tablet (20 mg total) by mouth daily. 90 tablet 3   glucose blood (ACCU-CHEK GUIDE) test strip Check blood sugar 2 times per day while on steroids before breakfast and dinner (Patient taking differently: 1 each by Other route See admin instructions. Check blood sugar 2 times per day while on steroids before breakfast and dinner) 50 each 3   lisinopril (ZESTRIL) 20 MG tablet Take 20 mg by mouth daily.     metFORMIN (GLUCOPHAGE) 500 MG tablet TAKE 1 TABLET(500 MG) BY MOUTH TWICE DAILY WITH A MEAL 180 tablet 3   [Paused] mirtazapine (REMERON) 7.5 MG tablet Take 1 tablet (7.5 mg total) by mouth  at bedtime. 30 tablet 1   nicotine (NICODERM CQ - DOSED IN MG/24 HOURS) 14 mg/24hr patch Place 1 patch (14 mg total) onto the skin daily. (Patient taking differently: Place 14 mg onto the skin daily.) 30 patch 3   [EXPIRED] oxyCODONE 10 MG TABS Take 1 tablet (10 mg total) by mouth every 4 (four) hours as needed for up to 5 days for severe pain (pain score 7-10) (Try oral before IV medicine.).     pantoprazole (PROTONIX) 40 MG tablet TAKE 1 TABLET(40 MG) BY MOUTH TWICE DAILY (Patient taking differently: Take 40 mg by  mouth daily.) 180 tablet 1   PROAIR HFA 108 (90 Base) MCG/ACT inhaler INHALE 1 TO 2 PUFFS INTO THE LUNGS EVERY 6 HOURS AS NEEDED FOR WHEEZING OR SHORTNESS OF BREATH (Patient taking differently: Inhale 1-2 puffs into the lungs every 6 (six) hours as needed for wheezing or shortness of breath.) 8.5 g 0   senna-docusate (SENOKOT-S) 8.6-50 MG tablet Take 1 tablet by mouth at bedtime as needed for mild constipation.     traZODone (DESYREL) 50 MG tablet TAKE 1 TABLET BY MOUTH AT BEDTIME      No results found. However, due to the size of the patient record, not all encounters were searched. Please check Results Review for a complete set of results. No results found.  Review Of Systems Constitutional: No fever, chills, weight loss or gain. Eyes: No vision change, wears glasses. No discharge or pain. Ears: No hearing loss, No tinnitus. Respiratory: No asthma, COPD, pneumonias. No shortness of breath. No hemoptysis. Cardiovascular: No chest pain, palpitation, leg edema. Gastrointestinal: No nausea, vomiting, diarrhea, constipation. No GI bleed. No hepatitis. Genitourinary: No dysuria, hematuria, kidney stone. No incontinance. Neurological: No headache, stroke, seizures.  Psychiatry: No psych facility admission for anxiety, depression, suicide. No detox. Skin: No rash. Musculoskeletal: Positive joint pain, fibromyalgia. No neck pain, back pain. Lymphadenopathy: No lymphadenopathy. Hematology: Positive anemia and easy bruising.   Blood pressure (!) 111/53, pulse (!) 53, temperature 98.5 F (36.9 C), temperature source Oral, resp. rate 18, height 5\' 4"  (1.626 m), weight 102.7 kg, SpO2 100%. Body mass index is 38.88 kg/m. General appearance: alert, cooperative, appears stated age and mild distress Head: Normocephalic, atraumatic. Eyes: Brown eyes, pale conjunctiva, corneas clear. Neck: No adenopathy, no carotid bruit, no JVD, supple, symmetrical, trachea midline and thyroid not enlarged. Resp:  Clear to auscultation bilaterally. Cardio: Regular rate and rhythm, S1, S2 normal, II/VI systolic murmur, no click, rub or gallop GI: Soft, non-tender; bowel sounds normal; no organomegaly. Extremities: Right ankle swelling and dressing with immobilizer. No cyanosis or clubbing. Skin: Warm and dry.  Neurologic: Alert and oriented X 3, normal strength. Normal coordination and wheel chair bound.  Assessment/Plan Sinus bradycardia Hereditary Hemorrhagic Teleangiectasia Anemia of chronic blood loss Right ankle fracture Type 2 DM Tobacco use disorder Generalized anxiety disorder  Plan: Agree with thyroid function. Will decrease amlodipine 75 % and add Hydralazine small dose bid for reflex tachycardia and BP control.    Time spent: Review of old records, Lab, x-rays, EKG, other cardiac tests, examination, discussion with patient/Nurse/PA over 70 minutes.  Ricki Rodriguez, MD  05/08/2023, 3:36 PM

## 2023-05-08 NOTE — Progress Notes (Addendum)
Dr. Algie Coffer on-call for unassigned cardiology consultation. Paged via [employer] ~10 am regarding persistent bradycardia. We discussed case and I will order TFTs for the AM and he will consult.

## 2023-05-08 NOTE — Progress Notes (Signed)
Patient ID: NAKEEMA LEITE, female   DOB: 04-27-1960, 63 y.o.   MRN: [id number]  05/07/23- SW met with pt in room to provide updates from team conference, and d/c date 2/1. No HHA preference. DME needed- w/c and TTB. SW will order items  05/08/23- SW sent [facility] referral to [facility] for HHPT/OT/aide and waiting on follow-up.   SW left message for pt son Clide Cliff informing on above, and waiting on follow-up.   Cecile Sheerer, MSW, LCSW Office: [phone] Cell: [phone] Fax: [phone]

## 2023-05-08 NOTE — Patient Care Conference (Signed)
Inpatient RehabilitationTeam Conference and Plan of Care Update Date: 05/07/2023   Time: 11:33 AM    Patient Name: Peggy House      Medical Record Number: [id number]  Date of Birth: [birth year] Sex: Female         Room/Bed: 4W04C/4W04C-01 Payor Info: Payor: [place] MEDICAID PREPAID HEALTH PLAN / Plan: [place] MEDICAID HEALTHY BLUE / Product Type: *No Product type* /    Admit Date/Time:  05/02/2023  3:30 PM  Primary Diagnosis:  Ankle fracture  Hospital Problems: Principal Problem:   Ankle fracture    Expected Discharge Date: Expected Discharge Date: 05/17/23  Team Members Present: Physician leading conference: Dr. Fanny Dance Social Worker Present: Cecile Sheerer, LCSWA Nurse Present: Chana Bode, RN;Angelina Shirlee Latch, RN;Karen Marjo Bicker, RN PT Present: Blima Rich, PT OT Present: Candee Furbish, OT SLP Present: Jeannie Done, SLP PPS Coordinator present : Fae Pippin, SLP     Current Status/Progress Goal Weekly Team Focus  Bowel/Bladder   incont of bowel and bladder   frequent toileting        Swallow/Nutrition/ Hydration               ADL's   Supervision UB, mod A LB, Mod A toileting   Mod I   Barriers- dynamic balance with unilateral UE support, functional endurance, LLE weakness during functional mobility, limited caregiving assist    Mobility   bed mobility supervision, sit<>stands with RW mod A, stand<>pivots with RW min A, WC mobility 169ft supervision, gait 69ft with RW min A   CGA, mod I WC mobility  barriers: pain, NWB RLE, son/his gf work during the day, global deconditioning, decreased standing balance    Communication                Safety/Cognition/ Behavioral Observations  Min A recall and problem solving   Sup A   implement memory strategies, mod complexity prob solving task    Pain   pain is controlled            Skin   foam on bottom   Assess skin qshift/prn and provide education to prevent skin breakdown          Discharge Planning:  Pt will d/c to her son's home. PRN support from her son as he works a splot 2nd/3rd shift. Pt sister [name] to be with her during the day while he is not there. No support from his girlfriend. SW will submit PCS referral. SW will confirm there are no barriers to discharge.   Team Discussion: Patient post ankle fracture with deconditioning. Limited initiation due to cognitive deficits.  Patient on target to meet rehab goals: yes, currently needs  min assist for upper body care and lower body care and min assist for sit - stand. Goals for discharge set for supervision overall.  *See Care Plan and progress notes for long and short-term goals.   Revisions to Treatment Plan:  Upgraded goals for PT   Teaching Needs: Safety, medications, skin care, transfers, toileting, etc.   Current Barriers to Discharge: Decreased caregiver support and weight bearing precautions  Possible Resolutions to Barriers: Family education PCS assist HH follow up services DME: TTB, W/C      Medical Summary Current Status: ankle fx, depression,htn, constipation, anemia, ankle pain, AKI, obesity  Barriers to Discharge: Medical stability;Weight bearing restrictions  Barriers to Discharge Comments: ankle fx, depression,htn, constipation, anemia, ankle pain, AKI, obesity Possible Resolutions to [employer] Focus: pain medications, monitor bm, monitor bowel function, monitor labs  Continued Need for Acute Rehabilitation Level of Care: The patient requires daily medical management by a physician with specialized training in physical medicine and rehabilitation for the following reasons: Direction of a multidisciplinary physical rehabilitation program to maximize functional independence : Yes Medical management of patient stability for increased activity during participation in an intensive rehabilitation regime.: Yes Analysis of laboratory values and/or radiology reports with any  subsequent need for medication adjustment and/or medical intervention. : Yes   I attest that I was present, lead the team conference, and concur with the assessment and plan of the team.   Chana Bode B 05/08/2023, 3:12 PM

## 2023-05-08 NOTE — Progress Notes (Signed)
Occupational Therapy Session Note  Patient Details  Name: Peggy House MRN: [id number] Date of Birth: [birth year]  Today's Date: 05/08/2023 OT Individual Time: 3710-6269 OT Individual Time Calculation (min): 45 min    Short Term Goals: Week 1:  OT Short Term Goal 1 (Week 1): Patient will improve UB strength to 4/5MMT at 95% safe OT Short Term Goal 2 (Week 1): The pt will complete LB bathing and dressing at MinA at 95% safe OT Short Term Goal 3 (Week 1): The pt will complete functional t/f's to all surfaces with ModI using RW at 95% safe OT Short Term Goal 4 (Week 1): The pt will adhere to RLE precaution of NWB 100% of the time without v/c's or reminders. OT Short Term Goal 5 (Week 1): The pt will tolerate > 40 minutes of functional activity at 95% safe to improve her functional outcome.  Skilled Therapeutic Interventions/Progress Updates:    1:1 Pt reports she had a nose bleed earlier. Pt able to come to EOB with supervision. Pt performed transfer with RW with mod A to come into standing and mod A to transfer to the w/c with RW.  Pt propelled to the dayroom. Focus on transfers and ability to lift bottom. Performed squat pivot transfer with min A with cues for setup and sequencing. Pt performed push up on blocks 11x to practicing pushing up to come into standing. Pt then transitioned from pushing up with BOTH hands on the EOM to then coming up in to standing with transitioning hand onto RW. Pt able to perform sit to stand pushing up with both hands with min A transitioning to min guard. Pt able to then transfer into w/c with RW with supervision.   Therapy Documentation Precautions:  Precautions Precautions: Fall Precaution Comments: RLE NWB Restrictions Weight Bearing Restrictions Per Provider Order: Yes RLE Weight Bearing Per Provider Order: Non weight bearing General:   Vital Signs: Therapy Vitals Temp: 98.5 F (36.9 C) Temp Source: Oral Pulse Rate: (!) 53 Resp: 18 BP:  (!) 111/53 Patient Position (if appropriate): Lying Oxygen Therapy SpO2: 100 % O2 Device: Room Air Pain: No c/o pain   Therapy/Group: Individual Therapy  Roney Mans [facility] 05/08/2023, 3:49 PM

## 2023-05-08 NOTE — Progress Notes (Signed)
Physical Therapy Session Note  Patient Details  Name: Peggy House MRN: [id number] Date of Birth: 1960/05/07  Today's Date: 05/08/2023 PT Individual Time: 2595-6387 PT Individual Time Calculation (min): 43 min   Short Term Goals: Week 1:  PT Short Term Goal 1 (Week 1): Pt will complete bed mobility with CGA PT Short Term Goal 2 (Week 1): Pt will complete bed<>chair transfers with minA and LRAD PT Short Term Goal 3 (Week 1): Pt will hop 36ft with minA and LRAD  Skilled Therapeutic Interventions/Progress Updates:    Pt received from dance group and agreeable to continue with this therapy session. Pt able to propel w/c ~74ft and set-up w/c including locking brakes and managing w/c leg rests with min cuing to prepare for stand pivot transfer to EOM.   Sit>stand and stand pivot w/c>EOM using RW with light min A and pt pushing up with L hand from w/c armrest - pt benefits from counting to 3 to time coming to stand - does well maintaining R LE NWBing with min cuing due to fatigue having harder time pushing through UEs to off-weight R LE to hop during transfers, so just scoots L foot on ground.   Sit>stand EOM>RW with light min A as described above throughout session.   Standing RLE strengthening and standing balance activity using B UE support on RW  with CGA for steadying during:  - gentle R LE foot taps on/off 5" step 2x 10 reps with focus on R hip/knee flexion AROM and coordinated movement in R LE - R LE hip abduction x10reps  - R LE hip extension x10reps  Pt's L LE becomes fatigued throughout these single leg stance exercises requiring frequent seated rest break.   L stand pivot EOM>w/c using RW with min A and pt now demoing improved ability to push through B UEs on AD to hop to complete transfer more successfully.   Transported back to room. Stand pivot w/c>EOB as described above using RW.  Sit>supine with supervision. Pt left supine in bed with needs in reach, R LE elevated on  pillows, and bed alarm on.   Therapy Documentation Precautions:  Precautions Precautions: Fall Precaution Comments: RLE NWB Restrictions Weight Bearing Restrictions Per Provider Order: Yes RLE Weight Bearing Per Provider Order: Non weight bearing   Pain:  No complaints of pain during session.    Therapy/Group: Individual Therapy  Ginny Forth , PT, DPT, NCS, CSRS 05/08/2023, 3:29 PM

## 2023-05-09 DIAGNOSIS — K5900 Constipation, unspecified: Secondary | ICD-10-CM | POA: Diagnosis not present

## 2023-05-09 DIAGNOSIS — S82851S Displaced trimalleolar fracture of right lower leg, sequela: Secondary | ICD-10-CM

## 2023-05-09 DIAGNOSIS — M25571 Pain in right ankle and joints of right foot: Secondary | ICD-10-CM | POA: Diagnosis not present

## 2023-05-09 DIAGNOSIS — S82891S Other fracture of right lower leg, sequela: Secondary | ICD-10-CM | POA: Diagnosis not present

## 2023-05-09 DIAGNOSIS — F411 Generalized anxiety disorder: Secondary | ICD-10-CM

## 2023-05-09 DIAGNOSIS — S82891D Other fracture of right lower leg, subsequent encounter for closed fracture with routine healing: Secondary | ICD-10-CM

## 2023-05-09 DIAGNOSIS — I10 Essential (primary) hypertension: Secondary | ICD-10-CM | POA: Diagnosis not present

## 2023-05-09 DIAGNOSIS — D500 Iron deficiency anemia secondary to blood loss (chronic): Secondary | ICD-10-CM

## 2023-05-09 LAB — RETICULOCYTES
Immature Retic Fract: 9.6 % (ref 2.3–15.9)
RBC.: 3.11 MIL/uL — ABNORMAL LOW (ref 3.87–5.11)
Retic Count, Absolute: 98.4 10*3/uL (ref 19.0–186.0)
Retic Ct Pct: 3.3 % — ABNORMAL HIGH (ref 0.4–3.1)

## 2023-05-09 LAB — FOLATE: Folate: 8.5 ng/mL (ref >5.9)

## 2023-05-09 LAB — IRON AND TIBC
Iron: 19 ug/dL — ABNORMAL LOW (ref 28–170)
Saturation Ratios: 6 % — ABNORMAL LOW (ref 10.4–31.8)
TIBC: 326 ug/dL (ref 250–450)
UIBC: 307 ug/dL

## 2023-05-09 LAB — TSH: TSH: 3.326 u[IU]/mL (ref 0.350–4.500)

## 2023-05-09 LAB — FERRITIN: Ferritin: 14 ng/mL (ref 11–307)

## 2023-05-09 LAB — T4, FREE: Free T4: 0.71 ng/dL (ref 0.61–1.12)

## 2023-05-09 LAB — VITAMIN B12: Vitamin B-12: 484 pg/mL (ref 180–914)

## 2023-05-09 MED ORDER — OXYCODONE HCL 5 MG PO TABS
5.0000 mg | ORAL_TABLET | ORAL | Status: DC | PRN
  Administered 2023-05-09 – 2023-05-19 (×34): 10 mg via ORAL
  Administered 2023-05-19 (×2): 5 mg via ORAL
  Filled 2023-05-09 (×7): qty 2
  Filled 2023-05-09: qty 1
  Filled 2023-05-09 (×28): qty 2

## 2023-05-09 MED ORDER — HYDRALAZINE HCL 10 MG PO TABS
10.0000 mg | ORAL_TABLET | Freq: Three times a day (TID) | ORAL | Status: DC
  Administered 2023-05-09 – 2023-05-12 (×9): 10 mg via ORAL
  Filled 2023-05-09 (×9): qty 1

## 2023-05-09 NOTE — Progress Notes (Signed)
Occupational Therapy Session Note  Patient Details  Name: Peggy House MRN: [id number] Date of Birth: Sep 08, 1960  Today's Date: 05/09/2023 OT Individual Time: 1345-1440 OT Individual Time Calculation (min): 55 min    Short Term Goals: Week 1:  OT Short Term Goal 1 (Week 1): Patient will improve UB strength to 4/5MMT at 95% safe OT Short Term Goal 2 (Week 1): The pt will complete LB bathing and dressing at MinA at 95% safe OT Short Term Goal 3 (Week 1): The pt will complete functional t/f's to all surfaces with ModI using RW at 95% safe OT Short Term Goal 4 (Week 1): The pt will adhere to RLE precaution of NWB 100% of the time without v/c's or reminders. OT Short Term Goal 5 (Week 1): The pt will tolerate > 40 minutes of functional activity at 95% safe to improve her functional outcome.  Skilled Therapeutic Interventions/Progress Updates:  Skilled OT intervention completed with focus on functional transfers, dynamic balance, activity tolerance. Pt received seated in w/c, agreeable to session. No pain reported at start of session.  Pt declined self-care needs. Pt self-propelled in w/c about 75 ft with supervision, then transported rest of way due to fatigue. Mod A sit > stand with RW with 1-3 counting method, then pt started stand pivot with hop method, however with fatigue onset and "got stuck" in stance a couple steps in front of mat with pt unable to heel <> toe shift on LLE or hop. OT had to manually guide pt's hip to EOM with mod A needed for lowering and safety. Discussed trial with shoe on LLE for heel <> toe transition for decreasing amount of hopping needed on LLE and bearing weight on BUE.  Pt participated in the following dynamic standing balance and endurance tasks to promote independence and safety during BADLs and functional mobility: -placing and retrieving clips on theraband on long mirror; Mod A needed for sit > stand with RW, and CGA for balance during task. Seated  rest needed for extreme fatigue  MD notified of pt informing OT of weekly iron injections she normally receives for low energy but does not remember receiving since IPR and feels her energy is off.   Mod A stand pivot with RW > w/c with cues needed for backing up safely to w/c with more heel to toe translation vs hopping due to LLE fatigue. Transported dependently in w/c > room, nurse notified of pain med request. Utilized stedy for time/pain management, with mod A needed and cues to avoid WB on RLE with stedy, then dependent transfer > EOB with min A needed to stand from perched position. Min A for RLE during sit > supine. Doffed TED hose off LLE.  Pt remained semi upright in bed, with bed alarm on/activated, and with all needs in reach at end of session.    Therapy Documentation Precautions:  Precautions Precautions: Fall Precaution Comments: RLE NWB Restrictions Weight Bearing Restrictions Per Provider Order: Yes RLE Weight Bearing Per Provider Order: Non weight bearing    Therapy/Group: Individual Therapy  Melvyn Novas, MS, OTR/L  05/09/2023, 2:45 PM

## 2023-05-09 NOTE — Progress Notes (Signed)
Physical Therapy Session Note  Patient Details  Name: Peggy House MRN: [id number] Date of Birth: 1961-01-15  Today's Date: 05/09/2023 PT Individual Time: 4742-5956 PT Individual Time Calculation (min): 69 min   Short Term Goals: Week 1:  PT Short Term Goal 1 (Week 1): Pt will complete bed mobility with CGA PT Short Term Goal 2 (Week 1): Pt will complete bed<>chair transfers with minA and LRAD PT Short Term Goal 3 (Week 1): Pt will hop 65ft with minA and LRAD  Skilled Therapeutic Interventions/Progress Updates:    Patient in supine and requesting to toilet.  S for supine to sit with HOB up and rails.  Stand pivot to [facility] with RW and mod A with bed height elevated.  Assisted with doffing brief and noted pt incontinent of stool.  Sit to stand from [facility] with mod A and max A for hygiene. Pivot to bed with RW and min A.  Sit to supine with S for completing hygiene and donning new brief.  Patient in wheelchair able to propel x 100' to dayroom with S.  Seated for L LAQ with 2# x 20.  Standing balance with NWB on R for reaching and placing pieces for large Connect [employer] game standing approx 2 minutes with CGA.  Patient ambulated 34' with RW and min A NWB on R LE.  Propelled to room with S x 100'.  Left in chair with R LE elevated, chair alarm active and all needs in reach.   Therapy Documentation Precautions:  Precautions Precautions: Fall Precaution Comments: RLE NWB Restrictions Weight Bearing Restrictions Per Provider Order: Yes RLE Weight Bearing Per Provider Order: Non weight bearing  Pain: Pain Assessment Pain Score: 5  Pain Type: Acute pain Pain Location: Ankle Pain Orientation: Right Pain Descriptors / Indicators: Aching Pain Onset: On-going Pain Intervention(s): Repositioned     Therapy/Group: Individual Therapy  Elray Mcgregor 05/09/2023, 9:39 AM  Sheran Lawless, PT 05/09/2023

## 2023-05-09 NOTE — Consult Note (Signed)
Ref: Olegario Messier, MD   Subjective:  Awake. Heart rate in high 40's to low 50;s. No chest pain, palpitation or dizziness. VS stable. Normal thyroid function.  Objective:  Vital Signs in the last 24 hours: Temp:  [98.4 F (36.9 C)-98.7 F (37.1 C)] 98.4 F (36.9 C) (01/24 1349) Pulse Rate:  [44-55] 49 (01/24 1349) Resp:  [14-19] 19 (01/24 1349) BP: (139-147)/(61-71) 146/61 (01/24 1349) SpO2:  [98 %-100 %] 100 % (01/24 1349) Weight:  [102.7 kg] 102.7 kg (01/24 1503)  Physical Exam: BP Readings from Last 1 Encounters:  05/09/23 (!) 146/61     Wt Readings from Last 1 Encounters:  05/09/23 102.7 kg    Weight change:  Body mass index is 38.88 kg/m. HEENT: [place]/AT, Eyes-Brown, Conjunctiva-Pale, Sclera-Non-icteric Neck: No JVD, No bruit, Trachea midline. Lungs:  Clear, Bilateral. Cardiac:  Regular rhythm, normal S1 and S2, no S3. II/VI systolic murmur. Abdomen:  Soft, non-tender. BS present. Extremities:  Right ankle immobilizer as before. No cyanosis. No clubbing. CNS: AxOx3, Cranial nerves grossly intact, wheel chair bound.  Skin: Warm and dry.   Intake/Output from previous day: 01/23 0701 - 01/24 0700 In: 960 [P.O.:960] Out: -     Lab Results: BMET    Component Value Date/Time   NA 138 05/05/2023 0525   NA 142 04/30/2023 0314   NA 141 04/29/2023 0227   NA 147 (H) 04/02/2017 1708   NA 141 02/05/2017 1407   NA 143 11/28/2016 1510   NA 144 12/20/2015 1543   NA 144 06/05/2015 1552   K 3.6 05/05/2023 0525   K 3.9 04/30/2023 0314   K 4.2 04/29/2023 0227   K 3.5 02/05/2017 1407   K 3.7 11/28/2016 1510   CL 106 05/05/2023 0525   CL 110 04/30/2023 0314   CL 109 04/29/2023 0227   CO2 24 05/05/2023 0525   CO2 26 04/30/2023 0314   CO2 26 04/29/2023 0227   CO2 24 02/05/2017 1407   CO2 26 11/28/2016 1510   GLUCOSE 106 (H) 05/05/2023 0525   GLUCOSE 123 (H) 04/30/2023 0314   GLUCOSE 100 (H) 04/29/2023 0227   GLUCOSE 134 02/05/2017 1407   GLUCOSE 104 11/28/2016  1510   BUN 10 05/05/2023 0525   BUN 22 04/30/2023 0314   BUN 28 (H) 04/29/2023 0227   BUN 5 (L) 04/02/2017 1708   BUN 9.3 02/05/2017 1407   BUN 6.5 (L) 11/28/2016 1510   BUN 9 12/20/2015 1543   BUN 9 06/05/2015 1552   CREATININE 0.93 05/05/2023 0525   CREATININE 0.96 04/30/2023 0314   CREATININE 1.08 (H) 04/29/2023 0227   CREATININE 0.89 04/04/2023 1111   CREATININE 0.84 03/07/2023 1252   CREATININE 0.80 02/14/2023 1029   CREATININE 0.9 02/05/2017 1407   CREATININE 0.8 11/28/2016 1510   CALCIUM 8.8 (L) 05/05/2023 0525   CALCIUM 8.5 (L) 04/30/2023 0314   CALCIUM 8.4 (L) 04/29/2023 0227   CALCIUM 8.9 02/05/2017 1407   CALCIUM 9.6 11/28/2016 1510   GFRNONAA >60 05/05/2023 0525   GFRNONAA >60 04/30/2023 0314   GFRNONAA 58 (L) 04/29/2023 0227   GFRNONAA >60 04/04/2023 1111   GFRNONAA >60 03/07/2023 1252   GFRNONAA >60 02/14/2023 1029   GFRAA >60 01/06/2020 0921   GFRAA >60 11/26/2019 1446   GFRAA >60 11/12/2019 1251   CBC    Component Value Date/Time   WBC 7.9 05/05/2023 0525   RBC 3.11 (L) 05/09/2023 1517   RBC 3.20 (L) 05/05/2023 0525   HGB 8.8 (L) 05/05/2023  0525   HGB 6.0 (LL) 04/04/2023 1111   HGB 8.6 (L) 06/14/2020 1626   HGB 8.1 (L) 02/05/2017 1407   HCT 28.0 (L) 05/05/2023 0525   HCT 28.2 (L) 06/14/2020 1626   HCT 26.6 (L) 02/05/2017 1407   PLT 377 05/05/2023 0525   PLT 458 (H) 04/04/2023 1111   PLT 353 06/14/2020 1626   MCV 87.5 05/05/2023 0525   MCV 89 06/14/2020 1626   MCV 82.8 02/05/2017 1407   MCH 27.5 05/05/2023 0525   MCHC 31.4 05/05/2023 0525   RDW 16.5 (H) 05/05/2023 0525   RDW 18.9 (H) 06/14/2020 1626   RDW 22.6 (H) 02/05/2017 1407   LYMPHSABS 1.7 04/28/2023 0248   LYMPHSABS 2.5 05/20/2017 1551   LYMPHSABS 2.6 02/05/2017 1407   MONOABS 0.8 04/28/2023 0248   MONOABS 0.5 02/05/2017 1407   EOSABS 0.2 04/28/2023 0248   EOSABS 0.1 05/20/2017 1551   BASOSABS 0.0 04/28/2023 0248   BASOSABS 0.0 05/20/2017 1551   BASOSABS 0.1 02/05/2017 1407    HEPATIC Function Panel Recent Labs    03/07/23 1252 04/04/23 1111 04/13/23 0327 04/22/23 0436 04/24/23 0251 04/25/23 0409 04/26/23 0311  PROT 6.4* 6.3* 5.4*  --   --   --   --   ALBUMIN 3.6 3.7 2.7*   < > 2.7* 2.6* 2.8*  AST 10* 9* 15  --   --   --   --   ALT <5 <5 8  --   --   --   --   ALKPHOS 59 61 49  --   --   --   --    < > = values in this interval not displayed.   HEMOGLOBIN A1C Lab Results  Component Value Date   MPG 96.8 02/01/2023   CARDIAC ENZYMES Lab Results  Component Value Date   CKTOTAL 57 02/01/2008   CKMB 0.7 02/01/2008   TROPONINI 0.04 (HH) 09/22/2017   TROPONINI 0.04 (HH) 09/22/2017   TROPONINI <0.03 09/22/2017   BNP No results for input(s): "PROBNP" in the last 8760 hours. TSH Recent Labs    05/09/23 0824  TSH 3.326   CHOLESTEROL No results for input(s): "CHOL" in the last 8760 hours.  Scheduled Meds:  acetaminophen  1,000 mg Oral Q8H   aminocaproic acid  1 g Oral Q12H   citalopram  20 mg Oral Daily   hydrALAZINE  10 mg Oral TID   lisinopril  10 mg Oral Daily   magnesium gluconate  500 mg Oral QHS   oxymetazoline  2 spray Each Nare QID   pantoprazole  40 mg Oral BID   polyethylene glycol  17 g Oral BID   senna-docusate  1 tablet Oral QHS   Continuous Infusions: PRN Meds:.alum & mag hydroxide-simeth, guaiFENesin-dextromethorphan, methocarbamol, oxyCODONE, sorbitol, traZODone, trimethobenzamide  Assessment/Plan: Sinus bradycardia Hereditary Hemorrhagic Teleangiectasia Anemia of chronic blood loss Right ankle fracture Type 2 DM Tobacco use disorder Generalized anxiety disorder  Plan: Discontinue amlodipine and increase hydralazine to 10 mg. 3 times daily.   LOS: 7 days   Time spent including chart review, lab review, examination, discussion with patient/Nurse/Tech : 30 min   Orpah Cobb  MD  05/09/2023, 5:22 PM

## 2023-05-09 NOTE — Plan of Care (Signed)
  Problem: Consults Goal: RH GENERAL PATIENT EDUCATION Description: See Patient Education module for education specifics. Outcome: Progressing   Problem: RH SKIN INTEGRITY Goal: RH STG SKIN FREE OF INFECTION/BREAKDOWN Description: Manage skin w min assist Outcome: Progressing   Problem: RH SAFETY Goal: RH STG ADHERE TO SAFETY PRECAUTIONS W/ASSISTANCE/DEVICE Description: STG Adhere to Safety Precautions With cues  Assistance/Device. Outcome: Progressing   Problem: RH PAIN MANAGEMENT Goal: RH STG PAIN MANAGED AT OR BELOW PT'S PAIN GOAL Description: < 4 with prns Outcome: Progressing

## 2023-05-09 NOTE — H&P (Cosign Needed Addendum)
Patient asking about IV iron administration. Will check anemia panel.  Dr. Hulda Humphrey to re-assess, obtain ankle x-rays for follow-up. Plan is to remove sutures middle of next week and transition into boot.

## 2023-05-09 NOTE — Group Note (Signed)
Patient Details Name: Peggy House MRN: [id number] DOB: [birth year] Today's Date: 05/09/2023  Time Calculation: OT Group Time Calculation OT Group Start Time: 1103 OT Group Stop Time: 1203 OT Group Time Calculation (min): 60 min      Group Description: BUE Therex Group: Pt participated in group session with a focus on BUE strength and endurance to facilitate improved activity tolerance and strength for higher level BADLs and functional mobility tasks.  Pt first in engaged in ball tosses around the room to other group members to facilitate improved BUE coordination, strength and cardiovascular endurance for ~ 6 mins as a light warm up  Pt engaged in seated therapeutic activity game where pts were instructed to roll large dice to see what number they rolled, once a number was determined, each number correlated to an UB exercise and a number of reps, exercises included bicep curls, overhead presses, upright rows, flys, chest presses and punches. Repetitions ranged from 10-20. Pt chose to use 1 lb weights during session. Eduction provided during activity of various modifications for all exercises. Education provided on the importance of deep breathing as well as determining each pts activity tolerance.  Ended session with guided deep breathing/stretching for 3 mins. Discussed benefits of deep breathing to manage stress and pain. Pt transported back to room by RT.   Pain: No pain reported during session  Barron Schmid 05/09/2023, 12:39 PM

## 2023-05-09 NOTE — Progress Notes (Signed)
PROGRESS NOTE   Subjective/Complaints: No new concerns this morning.  She reports her pain is overall under control.  She is seen by cardiology yesterday.  LBM 1/22  ROS:  Right ankle pain-controlled Denies fevers, malaise, chills, shortness of breath, chest pain, abdominal pain, headache, insomnia    Objective:   No results found. No results for input(s): "WBC", "HGB", "HCT", "PLT" in the last 72 hours.  No results for input(s): "NA", "K", "CL", "CO2", "GLUCOSE", "BUN", "CREATININE", "CALCIUM" in the last 72 hours.   Intake/Output Summary (Last 24 hours) at 05/09/2023 1617 Last data filed at 05/09/2023 0900 Gross per 24 hour  Intake 720 ml  Output --  Net 720 ml        Physical Exam: Vital Signs Blood pressure (!) 146/61, pulse (!) 49, temperature 98.4 F (36.9 C), temperature source Oral, resp. rate 19, height 5\' 4"  (1.626 m), weight 102.7 kg, SpO2 100%. Gen: no distress, normal appearing, resting in bed appears comfortable HEENT: oral mucosa pink and moist, NCAT, conjugate gaze Cardiovascular:     Rate and Rhythm: Bradycardia present.  Pulmonary:     Effort: Pulmonary effort is normal.  Abdominal:     General: There is no distension. + BS     Palpations: Abdomen is soft.  Musculoskeletal:        General: Swelling present. Normal range of motion.     Cervical back: Normal range of motion.     Comments: Right leg in splint  Skin: No breakdown noted in the visible portion Neurological:     Mental Status: She is alert.     Comments: Alert and oriented x 3. Normal insight and awareness. Intact Memory. Normal language and speech. Cranial nerve exam unremarkable. MMT: UE 4/5 prox to 4+/5 distal. RLE- 2/5 (pain), able to wiggle toes. LLE 3/5 HF, KE and 4+/5 ADF/PF.  Sensory exam normal for light touch and pain in all 4 limbs. No limb ataxia or cerebellar signs. No abnormal tone appreciated.   Stable  1/24 Psychiatric:        Mood and Affect: Very pleasant     Assessment/Plan: 1. Functional deficits which require 3+ hours per day of interdisciplinary therapy in a comprehensive inpatient rehab setting. Physiatrist is providing close team supervision and 24 hour management of active medical problems listed below. Physiatrist and rehab team continue to assess barriers to discharge/monitor patient progress toward functional and medical goals  Care Tool:  Bathing    Body parts bathed by patient: Right arm, Left arm, Chest, Abdomen, Front perineal area, Buttocks, Right upper leg, Left upper leg, Right lower leg, Left lower leg, Face   Body parts bathed by helper: Left lower leg, Right lower leg     Bathing assist Assist Level: Supervision/Verbal cueing     Upper Body Dressing/Undressing Upper body dressing   What is the patient wearing?: Pull over shirt    Upper body assist Assist Level: Set up assist    Lower Body Dressing/Undressing Lower body dressing      What is the patient wearing?: Underwear/pull up, Pants     Lower body assist Assist for lower body dressing: Minimal Assistance - Patient > 75%  Toileting Toileting    Toileting assist Assist for toileting: Moderate Assistance - Patient 50 - 74% (ModA for bring threading the items onto her lets and pulling them up.)     Transfers Chair/bed transfer  Transfers assist     Chair/bed transfer assist level: Minimal Assistance - Patient > 75%     Locomotion Ambulation   Ambulation assist      Assist level: Minimal Assistance - Patient > 75% Assistive device: Walker-rolling Max distance: 45ft   Walk 10 feet activity   Assist  Walk 10 feet activity did not occur: Safety/medical concerns (Per PT Eval)        Walk 50 feet activity   Assist Walk 50 feet with 2 turns activity did not occur: Safety/medical concerns         Walk 150 feet activity   Assist Walk 150 feet activity did not  occur: Safety/medical concerns         Walk 10 feet on uneven surface  activity   Assist Walk 10 feet on uneven surfaces activity did not occur: Safety/medical concerns         Wheelchair     Assist Is the patient using a wheelchair?: Yes Type of Wheelchair: Manual    Wheelchair assist level: Supervision/Verbal cueing Max wheelchair distance: 150'    Wheelchair 50 feet with 2 turns activity    Assist        Assist Level: Supervision/Verbal cueing   Wheelchair 150 feet activity     Assist      Assist Level: Supervision/Verbal cueing   Blood pressure (!) 146/61, pulse (!) 49, temperature 98.4 F (36.9 C), temperature source Oral, resp. rate 19, height 5\' 4"  (1.626 m), weight 102.7 kg, SpO2 100%.  Medical Problem List and Plan: 1. Functional deficits secondary to right trimalleolar ankle fx after syncopal episode.              -patient may shower if RLE is covered             -ELOS/Goals: 8-13 days,  mod I to supervision goals with PT and OT  Continue CIR PT, OT  -EST DC 2/1    2.  Antithrombotics: -DVT/anticoagulation:  Mechanical:  Antiembolism stockings, knee (TED hose) Bilateral lower extremities (chemo prophy d/c due to epistaxis, anemia)             -antiplatelet therapy: none   3. Pain Management:  -continue Tylenol 1000 mg q 8 hours             -continue oxycodone 10 mg q 4 hours prn  -1/24 will decrease oxycodone to 5 to 10 mg every 4 hours as needed.   4. Chronic depression/Insomnia: LCSW to evaluate and provide emotional support             -continue Celexa 20 mg daily             -trazodone 50 mg q HS prn sleep             -pt was on at bedtime remeron and xanax prn at home (not resumed)             -antipsychotic agents: n/a   5. Neuropsych/cognition: This patient is capable of making decisions on her own behalf.   6. Skin/Wound Care: Routine skin care checks   7. Fluids/Electrolytes/Nutrition: Routine Is and Os and follow-up  chemistries   8: Hypertension: monitor TID and prn             -  continue amlodipine 10 mg daily             -continue lisinopril 20 mg daily  -1/24 cardiology has stopped amlodipine, started hydralazine.  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 Pt is transf[facility] almost [facility]pt at [facility]             -follow-up CBC             -goal to keep hgb >8  -h/h stable  -1/24 later this afternoon it appears she reported to therapy having prior iron infusions.  It appears she had iron studies ordered   14: Intermittent epistaxis due to #11: - Afrin, other measures as needed             -consider ENT consultation for hemostasis              15: Right ankle fracture s/p ORIF 1/19, Dr. Hulda Humphrey (has nylon sutures and short leg splint with posterior slab and stirrups)             -NWB for 8-10 weeks             -follow-up ortho in 2-3 weeks post-op   16: Tobacco use: cessation counseling   17: Code status: DNR-limited   19: DM-2: A1c = 5.0% October 2024 (glucoses ~100-120s on regular diet)             -home metformin 500 mg BID not restarted             -d/c CBGs  D/c Boost  -Glucose stable on BMP today 1/20   20: Thrombocytosis: follow-up CBC   21: New onset nausea:             -pt reports that it came on after FFP yesterday afternoon.             -had large bm this morning with no improvement in symptoms              -continue with prn tigan, observe for now, belly is benign             -I don't  see any other obvious causes the moment.   22. Diastolic hypotension: decrease lisinopril to 10mg  daily.  23. Systolic hypertension: increase magnesium gluconate to 500mg  HS, continue amlodipine 10mg  daily  -1/22 BP intermittently elevated but controlled this morning, continue to monitor   24. Constipation: Last BM 1/17, d/c fleet enema, magnesium gluconate increased to 500mg  HS  -1/20 Continue Senokot, MiraLAX twice daily, sorbitol as needed.  Consider additional medication if no BM by tomorrow  1/21 BM today improved, decrease Senokot frequency from twice a day to once a day  1/24 consider additional medication if no bowel movement by tomorrow  25.  Recent AKI: decrease lisinopril to 10mg  daily and repeat creatinine tomorrow.   -10/20 creatinine BUN stable at 0.93/10   26. Bradycardia  -1/23 Avoid negative chronotropic medications.  Will check EKG- bardycardia. Will call cardiology to discuss. Asymptomatic . Spoke with pharmacy-doesn't look like any meds contributing  1/24 patient seen by cardiology, amlodipine stopped, hydralazine started. Thyroid studies- stable  LOS: 7 days A FACE TO FACE EVALUATION WAS PERFORMED  Fanny Dance 05/09/2023, 4:17 PM

## 2023-05-09 NOTE — Progress Notes (Addendum)
Patient ID: GENECIS VELEY, female   DOB: Aug 08, 1960, 63 y.o.   MRN: [id number]  SW faxed PCS referral to [facility] Dept/Case Management Fax #[phone]. *SW received confirmation fax indicating referral received.   DME ordered- w/c and TTB with [employer] via parachute.   [facility] accepted referral for HHPT/OT/aide .  Cecile Sheerer, MSW, LCSW Office: [phone] Cell: [phone] Fax: [phone]

## 2023-05-09 NOTE — Progress Notes (Signed)
Patient ID: Peggy House, female   DOB: [birth year], 63 y.o.   MRN: [id number] Met with the patient to review current medical situation, rehab process, team conference and plan of care. Discussed secondary risk management including HHT, HTN, DM and anemia.  Reviewed NWB precautions and skin care for pressure injury; confirming incision care. Expressed concerns about discharge destination; working on confirmation of plans for d/c 05/17/23.  Continue to follow along to address educational needs to facilitate preparation for discharge. [name]

## 2023-05-10 DIAGNOSIS — S82851D Displaced trimalleolar fracture of right lower leg, subsequent encounter for closed fracture with routine healing: Principal | ICD-10-CM

## 2023-05-10 LAB — BASIC METABOLIC PANEL
Anion gap: 9 (ref 5–15)
BUN: 12 mg/dL (ref 8–23)
CO2: 24 mmol/L (ref 22–32)
Calcium: 8.8 mg/dL — ABNORMAL LOW (ref 8.9–10.3)
Chloride: 107 mmol/L (ref 98–111)
Creatinine, Ser: 1.17 mg/dL — ABNORMAL HIGH (ref 0.44–1.00)
GFR, Estimated: 53 mL/min — ABNORMAL LOW (ref >60)
Glucose, Bld: 117 mg/dL — ABNORMAL HIGH (ref 70–99)
Potassium: 3.8 mmol/L (ref 3.5–5.1)
Sodium: 140 mmol/L (ref 135–145)

## 2023-05-10 LAB — CBC
HCT: 25.7 % — ABNORMAL LOW (ref 36.0–46.0)
Hemoglobin: 7.7 g/dL — ABNORMAL LOW (ref 12.0–15.0)
MCH: 25.8 pg — ABNORMAL LOW (ref 26.0–34.0)
MCHC: 30 g/dL (ref 30.0–36.0)
MCV: 86.2 fL (ref 80.0–100.0)
Platelets: 210 10*3/uL (ref 150–400)
RBC: 2.98 MIL/uL — ABNORMAL LOW (ref 3.87–5.11)
RDW: 16.7 % — ABNORMAL HIGH (ref 11.5–15.5)
WBC: 6.7 10*3/uL (ref 4.0–10.5)
nRBC: 0 % (ref 0.0–0.2)

## 2023-05-10 LAB — T3, FREE: T3, Free: 2.9 pg/mL (ref 2.0–4.4)

## 2023-05-10 LAB — T3 UPTAKE: T3 Uptake Ratio: 25 % (ref 24–39)

## 2023-05-10 NOTE — Progress Notes (Signed)
PROGRESS NOTE   Subjective/Complaints: Pt reports pain doing well.  Ate 100% tray. No issues.     LBM 1/22- she said LBM was Thursday- denies constipation.   ROS:  Right ankle pain-controlled  Pt denies SOB, abd pain, CP, N/V/C/D, and vision changes    Objective:   No results found. Recent Labs    05/10/23 0629  WBC 6.7  HGB 7.7*  HCT 25.7*  PLT 210    Recent Labs    05/10/23 0629  NA 140  K 3.8  CL 107  CO2 24  GLUCOSE 117*  BUN 12  CREATININE 1.17*  CALCIUM 8.8*     Intake/Output Summary (Last 24 hours) at 05/10/2023 1129 Last data filed at 05/10/2023 0929 Gross per 24 hour  Intake 240 ml  Output --  Net 240 ml        Physical Exam: Vital Signs Blood pressure (!) 122/51, pulse (!) 45, temperature 98.5 F (36.9 C), resp. rate 18, height 5\' 4"  (1.626 m), weight 102.7 kg, SpO2 98%.    General: awake, alert, appropriate, sitting up in bed ate 100% tray; NAD HENT: conjugate gaze; oropharynx moist CV: regular rhythm, bradycardic  rate; no JVD Pulmonary: CTA B/L; no W/R/R- good air movement GI: soft, NT, ND, (+)BS- hypoactive Psychiatric: appropriate Neurological: Ox3  Musculoskeletal:        General: Swelling present. Normal range of motion.     Cervical back: Normal range of motion.     Comments: Right leg in splint  Skin: No breakdown noted in the visible portion Neurological:     Mental Status: She is alert.     Comments: Alert and oriented x 3. Normal insight and awareness. Intact Memory. Normal language and speech. Cranial nerve exam unremarkable. MMT: UE 4/5 prox to 4+/5 distal. RLE- 2/5 (pain), able to wiggle toes. LLE 3/5 HF, KE and 4+/5 ADF/PF.  Sensory exam normal for light touch and pain in all 4 limbs. No limb ataxia or cerebellar signs. No abnormal tone appreciated.   Stable 1/22 Psychiatric:        Mood and Affect: Mood normal.        Behavior: Behavior normal.       Assessment/Plan: 1. Functional deficits which require 3+ hours per day of interdisciplinary therapy in a comprehensive inpatient rehab setting. Physiatrist is providing close team supervision and 24 hour management of active medical problems listed below. Physiatrist and rehab team continue to assess barriers to discharge/monitor patient progress toward functional and medical goals  Care Tool:  Bathing    Body parts bathed by patient: Right arm, Left arm, Chest, Abdomen, Front perineal area, Buttocks, Right upper leg, Left upper leg, Right lower leg, Left lower leg, Face   Body parts bathed by helper: Left lower leg, Right lower leg     Bathing assist Assist Level: Supervision/Verbal cueing     Upper Body Dressing/Undressing Upper body dressing   What is the patient wearing?: Pull over shirt    Upper body assist Assist Level: Set up assist    Lower Body Dressing/Undressing Lower body dressing      What is the patient wearing?: Underwear/pull up, Pants  Lower body assist Assist for lower body dressing: Minimal Assistance - Patient > 75%     Toileting Toileting    Toileting assist Assist for toileting: Moderate Assistance - Patient 50 - 74% (ModA for bring threading the items onto her lets and pulling them up.)     Transfers Chair/bed transfer  Transfers assist     Chair/bed transfer assist level: Minimal Assistance - Patient > 75%     Locomotion Ambulation   Ambulation assist      Assist level: Minimal Assistance - Patient > 75% Assistive device: Walker-rolling Max distance: 15   Walk 10 feet activity   Assist  Walk 10 feet activity did not occur: Safety/medical concerns (Per PT Eval)  Assist level: Minimal Assistance - Patient > 75% Assistive device: Walker-rolling   Walk 50 feet activity   Assist Walk 50 feet with 2 turns activity did not occur: Safety/medical concerns         Walk 150 feet activity   Assist Walk 150 feet  activity did not occur: Safety/medical concerns         Walk 10 feet on uneven surface  activity   Assist Walk 10 feet on uneven surfaces activity did not occur: Safety/medical concerns         Wheelchair     Assist Is the patient using a wheelchair?: Yes Type of Wheelchair: Manual    Wheelchair assist level: Supervision/Verbal cueing Max wheelchair distance: 100'    Wheelchair 50 feet with 2 turns activity    Assist        Assist Level: Supervision/Verbal cueing   Wheelchair 150 feet activity     Assist      Assist Level: Supervision/Verbal cueing   Blood pressure (!) 122/51, pulse (!) 45, temperature 98.5 F (36.9 C), resp. rate 18, height 5\' 4"  (1.626 m), weight 102.7 kg, SpO2 98%.  Medical Problem List and Plan: 1. Functional deficits secondary to right trimalleolar ankle fx after syncopal episode.              -patient may shower if RLE is covered             -ELOS/Goals: 8-13 days,  mod I to supervision goals with PT and OT  Con't CIR PT and OT  -est DC 2/1    2.  Antithrombotics: -DVT/anticoagulation:  Mechanical:  Antiembolism stockings, knee (TED hose) Bilateral lower extremities (chemo prophy d/c due to epistaxis, anemia)             -antiplatelet therapy: none   3. Pain Management:  -continue Tylenol 1000 mg q 8 hours             -continue oxycodone 10 mg q 4 hours prn  -1/23-1/25 pain controlled, continue current    4. Chronic depression/Insomnia: LCSW to evaluate and provide emotional support             -continue Celexa 20 mg daily             -trazodone 50 mg q HS prn sleep             -pt was on at bedtime remeron and xanax prn at home (not resumed)             -antipsychotic agents: n/a   5. Neuropsych/cognition: This patient is capable of making decisions on her own behalf.   6. Skin/Wound Care: Routine skin care checks   7. 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 Pt is transfused almost weekly as an outpt at [facility]             -follow-up CBC             -goal to keep hgb >8  -h/h stable   1/25- Hb 7.7- is overall a little less, but was running 8.1 or so- will monitor 14: Intermittent epistaxis due to #11: - Afrin, other measures as needed             -consider ENT consultation for hemostasis              15: Right ankle fracture s/p ORIF 1/19, Dr. Hulda Humphrey (has nylon sutures and short leg splint with posterior slab and stirrups)             -NWB for 8-10 weeks             -follow-up ortho in 2-3 weeks post-op   16: Tobacco use: cessation counseling   17: Code status: DNR-limited       19: DM-2: A1c = 5.0% October 2024 (glucoses ~100-120s on regular diet)             -home metformin 500 mg BID not restarted             -d/c CBGs  D/c Boost  -Glucose stable on BMP today 1/20   20: Thrombocytosis: follow-up CBC   21: New onset nausea:             -pt reports that it came on after FFP yesterday afternoon.             -had large bm this morning with no improvement in symptoms              -continue with prn  tigan, observe for now, belly is benign             -I don't see any other obvious causes the moment.   22. Diastolic hypotension: decrease lisinopril to 10mg  daily.  23. Systolic hypertension: increase magnesium gluconate to 500mg  HS, continue amlodipine 10mg  daily  -1/22 BP intermittently elevated but controlled this morning, continue to monitor  -1/23 Consider additional medication if remains elevated       05/10/2023    5:39 AM 05/09/2023  8:30 PM 05/09/2023    3:03 PM  Vitals with BMI  Weight   226 lbs 8 oz  BMI   38.86  Systolic 122 152   Diastolic 51 68   Pulse 45 52      24. Constipation: Last BM 1/17, d/c fleet enema, magnesium gluconate increased to 500mg  HS  -1/20 Continue Senokot, MiraLAX twice daily, sorbitol as needed.  Consider additional medication if no BM by tomorrow  1/21 BM today improved, decrease Senokot frequency from twice a day to once a day  1/23 LBM yesterday stable  1/25- LBM 3 days ago- if no BM by tomorrow, will intervene 25. Recent AKI: decrease lisinopril to 10mg  daily and repeat creatinine tomorrow.   -10/20 creatinine BUN stable at 0.93/10   26. Bradycardia  -1/23 Avoid negative chronotropic medications.  Will check EKG- bardycardia. Will call cardiology to discuss. Asymptomatic . Spoke with pharmacy-doesn't look like any meds contributing  1/25- Cards stopped Norvasc and increased hydralazine to 10 mg TID 27. AKI  1/25- Cr up to 1.27- will push fluids and recheck Monday- asked nursing to specifically push 6-8 cups/day   I spent a total of 37   minutes on total care today- >50% coordination of care- due to  D/w nursing about Cr/fluids- also reviewed new labs and acted on those- reviewed Cards note-    LOS: 8 days A FACE TO FACE EVALUATION WAS PERFORMED  Peggy House 05/10/2023, 11:29 AM

## 2023-05-10 NOTE — Progress Notes (Signed)
Orthopedic Tech Progress Note Patient Details:  Peggy House Dec 15, 1960 [id number]  Ortho Devices Type of Ortho Device: CAM walker Ortho Device/Splint Location: RLE Ortho Device/Splint Interventions: Application   Post Interventions Patient Tolerated: Well  Genelle Bal Peggy House 05/10/2023, 2:48 PM

## 2023-05-10 NOTE — Progress Notes (Signed)
Occupational Therapy Session Note  Patient Details  Name: Peggy House MRN: [id number] Date of Birth: Mar 04, 1961  Today's Date: 05/10/2023 OT Individual Time: 1300-1345 OT Individual Time Calculation (min): 45 min    Short Term Goals: Week 1:  OT Short Term Goal 1 (Week 1): Patient will improve UB strength to 4/5MMT at 95% safe OT Short Term Goal 2 (Week 1): The pt will complete LB bathing and dressing at MinA at 95% safe OT Short Term Goal 3 (Week 1): The pt will complete functional t/f's to all surfaces with ModI using RW at 95% safe OT Short Term Goal 4 (Week 1): The pt will adhere to RLE precaution of NWB 100% of the time without v/c's or reminders. OT Short Term Goal 5 (Week 1): The pt will tolerate > 40 minutes of functional activity at 95% safe to improve her functional outcome.   Skilled Therapeutic Interventions/Progress Updates:    Pt bed level at time of session, no pain at rest. Pt agreeable to get OOB, stating brief is soiled. Rolling L/R with CGA for dependent brief change and hygiene, supine > sit with Supervision and extended time. UB bathe with CGA, LB bathe MOD A overall but did not wash periarea at this time. LB dressing with MAX for TED hose, MOD for single sock, and MAX/total for pants this date with sit > stand at RW. Note pt with significant trouble sequencing 1-2-3 method for sit <> stands needing increased assist and difficulty with NWB. Needing to toilet at this time, 2/2 difficulty with sit <> stands today used stedy and stedy bed > BSC for BM. Pt needing extended time for all toilting tasks and BM, OT spoke with nursing regarding resuming pt care for toileting. All needs met at end of session with nursing to take over.  Therapy Documentation Precautions:  Precautions Precautions: Fall Precaution Comments: RLE NWB Restrictions Weight Bearing Restrictions Per Provider Order: Yes RLE Weight Bearing Per Provider Order: Non weight  bearing     Therapy/Group: Individual Therapy  Erasmo Score 05/10/2023, 1:39 PM

## 2023-05-10 NOTE — Progress Notes (Signed)
.  Subjective: 63 year old female 2 weeks s/p right ankle ORIF. Has been in acute rehab and doing well. Minimal pain in ankle. Compliant with NWB  Activity level:  NWB Right ankle 8-12 weeks post op Diet tolerance:  as tolerated Patient reports pain as mild.    Objective: Vital signs in last 24 hours: Temp:  [98.4 F (36.9 C)-99.6 F (37.6 C)] 98.6 F (37 C) (01/25 1255) Pulse Rate:  [45-55] 55 (01/25 1255) Resp:  [18-19] 18 (01/25 1255) BP: (122-160)/(51-68) 160/61 (01/25 1255) SpO2:  [98 %-100 %] 98 % (01/25 0539) Weight:  [102.7 kg] 102.7 kg (01/24 1503)  Labs: Recent Labs    05/10/23 0629  HGB 7.7*   Recent Labs    05/09/23 1517 05/10/23 0629  WBC  --  6.7  RBC 3.11* 2.98*  HCT  --  25.7*  PLT  --  210   Recent Labs    05/10/23 0629  NA 140  K 3.8  CL 107  CO2 24  BUN 12  CREATININE 1.17*  GLUCOSE 117*  CALCIUM 8.8*   No results for input(s): "LABPT", "INR" in the last 72 hours.  Physical Exam:  Neurologically intact ABD soft Neurovascular intact Sensation intact SP/DP/T Motor intact EHL/FHL Intact pulses distally Incision: dressing/splint C/D/I   Imaging: 3 view right ankle XR: s/p ORIF right ankle with maintained hardware and alignment Assessment/Plan: 63 year old female 2 weeks s/p right ankle ORIF. Doing well in acute rehab   NWB RLE for 8-12 weeks post op Continue PT Patient can transition to a removable walking boot. Once boot is available the splint can be removed and sutures removed and boot applied. Patient will still be NWB with boot but can remove it for hygiene and ROM with PT. Continue pain control as needed Patient not on DVT ppx due to frequent bleeding Please contact orthopaedic with any questions or concerns    Luci Bank 05/10/2023, 12:58 PM

## 2023-05-11 DIAGNOSIS — S82851D Displaced trimalleolar fracture of right lower leg, subsequent encounter for closed fracture with routine healing: Secondary | ICD-10-CM | POA: Diagnosis not present

## 2023-05-11 MED ORDER — HYDRALAZINE HCL 25 MG PO TABS: 25.0000 mg | ORAL_TABLET | Freq: Three times a day (TID) | ORAL | Status: DC | PRN

## 2023-05-11 NOTE — Progress Notes (Signed)
PROGRESS NOTE   Subjective/Complaints:  LBM yesterday- medium type 6 Pt reports they got out some of sutures- but per nurse, couldn't see how to get out the rest- only could see knots, not loops.   Said feeling better.      ROS:  Right ankle pain-controlled   Pt denies SOB, abd pain, CP, N/V/C/D, and vision changes   Objective:   No results found. Recent Labs    05/10/23 0629  WBC 6.7  HGB 7.7*  HCT 25.7*  PLT 210    Recent Labs    05/10/23 0629  NA 140  K 3.8  CL 107  CO2 24  GLUCOSE 117*  BUN 12  CREATININE 1.17*  CALCIUM 8.8*     Intake/Output Summary (Last 24 hours) at 05/11/2023 1021 Last data filed at 05/10/2023 1800 Gross per 24 hour  Intake 357 ml  Output --  Net 357 ml        Physical Exam: Vital Signs Blood pressure (!) 173/65, pulse (!) 48, temperature 98 F (36.7 C), resp. rate 18, height 5\' 4"  (1.626 m), weight 102.7 kg, SpO2 100%.      General: awake, alert, appropriate, sitting up in bed; NAD HENT: conjugate gaze; oropharynx moist CV: regular rhythm, bradycardic rate; no JVD Pulmonary: CTA B/L; no W/R/R- good air movement GI: soft, NT, ND, (+)BS- more normoactive Psychiatric: appropriate Neurological: Ox3 Extremities; in Boot on RLE/ Ankle- steristrips on medial ankle  Musculoskeletal:        General: Swelling present. Normal range of motion.     Cervical back: Normal range of motion.     Comments: Right leg in splint  Skin: No breakdown noted in the visible portion Neurological:     Mental Status: She is alert.     Comments: Alert and oriented x 3. Normal insight and awareness. Intact Memory. Normal language and speech. Cranial nerve exam unremarkable. MMT: UE 4/5 prox to 4+/5 distal. RLE- 2/5 (pain), able to wiggle toes. LLE 3/5 HF, KE and 4+/5 ADF/PF.  Sensory exam normal for light touch and pain in all 4 limbs. No limb ataxia or cerebellar signs. No abnormal tone  appreciated.   Stable 1/22 Psychiatric:        Mood and Affect: Mood normal.        Behavior: Behavior normal.      Assessment/Plan: 1. Functional deficits which require 3+ hours per day of interdisciplinary therapy in a comprehensive inpatient rehab setting. Physiatrist is providing close team supervision and 24 hour management of active medical problems listed below. Physiatrist and rehab team continue to assess barriers to discharge/monitor patient progress toward functional and medical goals  Care Tool:  Bathing    Body parts bathed by patient: Right arm, Left arm, Chest, Abdomen, Front perineal area, Buttocks, Right upper leg, Left upper leg, Right lower leg, Left lower leg, Face   Body parts bathed by helper: Left lower leg, Right lower leg     Bathing assist Assist Level: Supervision/Verbal cueing     Upper Body Dressing/Undressing Upper body dressing   What is the patient wearing?: Pull over shirt    Upper body assist Assist Level: Set up assist  Lower Body Dressing/Undressing Lower body dressing      What is the patient wearing?: Underwear/pull up, Pants     Lower body assist Assist for lower body dressing: Minimal Assistance - Patient > 75%     Toileting Toileting    Toileting assist Assist for toileting: Moderate Assistance - Patient 50 - 74% (ModA for bring threading the items onto her lets and pulling them up.)     Transfers Chair/bed transfer  Transfers assist     Chair/bed transfer assist level: Minimal Assistance - Patient > 75%     Locomotion Ambulation   Ambulation assist      Assist level: Minimal Assistance - Patient > 75% Assistive device: Walker-rolling Max distance: 15   Walk 10 feet activity   Assist  Walk 10 feet activity did not occur: Safety/medical concerns (Per PT Eval)  Assist level: Minimal Assistance - Patient > 75% Assistive device: Walker-rolling   Walk 50 feet activity   Assist Walk 50 feet with 2  turns activity did not occur: Safety/medical concerns         Walk 150 feet activity   Assist Walk 150 feet activity did not occur: Safety/medical concerns         Walk 10 feet on uneven surface  activity   Assist Walk 10 feet on uneven surfaces activity did not occur: Safety/medical concerns         Wheelchair     Assist Is the patient using a wheelchair?: Yes Type of Wheelchair: Manual    Wheelchair assist level: Supervision/Verbal cueing Max wheelchair distance: 100'    Wheelchair 50 feet with 2 turns activity    Assist        Assist Level: Supervision/Verbal cueing   Wheelchair 150 feet activity     Assist      Assist Level: Supervision/Verbal cueing   Blood pressure (!) 173/65, pulse (!) 48, temperature 98 F (36.7 C), resp. rate 18, height 5\' 4"  (1.626 m), weight 102.7 kg, SpO2 100%.  Medical Problem List and Plan: 1. Functional deficits secondary to right trimalleolar ankle fx after syncopal episode.              -patient may shower if RLE is covered             -ELOS/Goals: 8-13 days,  mod I to supervision goals with PT and OT  -est DC 2/1  Con't CIR PT and OT   2.  Antithrombotics: -DVT/anticoagulation:  Mechanical:  Antiembolism stockings, knee (TED hose) Bilateral lower extremities (chemo prophy d/c due to epistaxis, anemia)             -antiplatelet therapy: none   3. Pain Management:  -continue Tylenol 1000 mg q 8 hours             -continue oxycodone 10 mg q 4 hours prn  -1/23-1/25 pain controlled, continue current    1/26- pain controlled with meds 4. Chronic depression/Insomnia: LCSW to evaluate and provide emotional support             -continue Celexa 20 mg daily             -trazodone 50 mg q HS prn sleep             -pt was on at bedtime remeron and xanax prn at home (not resumed)             -antipsychotic agents: n/a   5. Neuropsych/cognition: This patient is capable of making decisions  on her own behalf.    6. Skin/Wound Care: Routine skin care checks   7. Fluids/Electrolytes/Nutrition: Routine Is and Os and follow-up chemistries   8: Hypertension: monitor TID and prn             -continue amlodipine 10 mg daily             -continue lisinopril 20 mg daily  -1/21 fair control continue to monitor trend for now  1/25- BP ocntrolled- Cards increased hydralazine to 10 mg TID when stopped Norvasc  1/26- Pt's BP up to 150s-170s since Norvasc stopped- if BP doesn't come down in next 24 hours, suggest increase of Hydralazine to 25 mg TID- will order prn' hydralazine 25 mg q8 hours prn as well if gets >180/100    05/11/2023    5:35 AM 05/10/2023    7:36 PM 05/10/2023   12:55 PM  Vitals with BMI  Systolic 173 174 329  Diastolic 65 68 61  Pulse 48 52 55      9: Hyperlipidemia: continue statin   10: GERD: continue PPI   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 [facility]             -follow-up CBC             -goal to keep hgb >8  -h/h stable   1/25- Hb 7.7- is overall a little less, but was running 8.1 or so- will monitor 14: Intermittent epistaxis due to #11: - Afrin, other measures as needed             -consider ENT consultation for hemostasis              15: Right ankle fracture s/p ORIF 1/19, Dr. Hulda Humphrey (has nylon sutures and short leg splint with posterior slab and stirrups)             -NWB for 8-10 weeks             -follow-up ortho in 2-3 weeks post-op   1/26- got 1/2 sutures out- asked Charge nurse to see if can get rest of sutures out/find them? 16: Tobacco use: cessation counseling   17: Code status: DNR-limited       19:  DM-2: A1c = 5.0% October 2024 (glucoses ~100-120s on regular diet)             -home metformin 500 mg BID not restarted             -d/c CBGs  D/c Boost  -Glucose stable on BMP today 1/20   20: Thrombocytosis: follow-up CBC   21: New onset nausea:             -pt reports that it came on after FFP yesterday afternoon.             -had large bm this morning with no improvement in symptoms              -continue with prn tigan, observe for now, belly is benign             -  I don't see any other obvious causes the moment.   22. Diastolic hypotension: decrease lisinopril to 10mg  daily.  23. Systolic hypertension: increase magnesium gluconate to 500mg  HS, continue amlodipine 10mg  daily  -1/22 BP intermittently elevated but controlled this morning, continue to monitor  -1/23 Consider additional medication if remains elevated   1/26- Cards stopped Norvasc- and increased hydralzine to 10 mg TID_ might need increase in Hydralazine vs Lisinopril    05/11/2023    5:35 AM 05/10/2023    7:36 PM 05/10/2023   12:55 PM  Vitals with BMI  Systolic 173 174 578  Diastolic 65 68 61  Pulse 48 52 55     24. Constipation: Last BM 1/17, d/c fleet enema, magnesium gluconate increased to 500mg  HS  -1/20 Continue Senokot, MiraLAX twice daily, sorbitol as needed.  Consider additional medication if no BM by tomorrow  1/21 BM today improved, decrease Senokot frequency from twice a day to once a day  1/23 LBM yesterday stable  1/25- LBM 3 days ago- if no BM by tomorrow, will intervene  1/26- LBM medium type 6 yesterday 25. Recent AKI: decrease lisinopril to 10mg  daily and repeat creatinine tomorrow.   -10/20 creatinine BUN stable at 0.93/10   26. Bradycardia  -1/23 Avoid negative chronotropic medications.  Will check EKG- bardycardia. Will call cardiology to discuss. Asymptomatic . Spoke with pharmacy-doesn't look like any meds contributing  1/25- Cards stopped Norvasc and increased hydralazine to 10 mg  TID 27. AKI  1/25- Cr up to 1.27- will push fluids and recheck Monday- asked nursing to specifically push 6-8 cups/day  1/26- won't increase lisinopril since can affect Cr   I spent a total of 37   minutes on total care today- >50% coordination of care- due to  D/w charge nurse and pt's nurse about sutures and BP/HR    LOS: 9 days A FACE TO FACE EVALUATION WAS PERFORMED  Ferguson Gertner 05/11/2023, 10:21 AM

## 2023-05-11 NOTE — Progress Notes (Signed)
Sutures removed per MD order. Patient tolerated well. No concerns at this time

## 2023-05-12 DIAGNOSIS — S82851D Displaced trimalleolar fracture of right lower leg, subsequent encounter for closed fracture with routine healing: Secondary | ICD-10-CM | POA: Diagnosis not present

## 2023-05-12 LAB — BASIC METABOLIC PANEL
Anion gap: 9 (ref 5–15)
BUN: 11 mg/dL (ref 8–23)
CO2: 20 mmol/L — ABNORMAL LOW (ref 22–32)
Calcium: 8.3 mg/dL — ABNORMAL LOW (ref 8.9–10.3)
Chloride: 106 mmol/L (ref 98–111)
Creatinine, Ser: 1.06 mg/dL — ABNORMAL HIGH (ref 0.44–1.00)
GFR, Estimated: 59 mL/min — ABNORMAL LOW (ref >60)
Glucose, Bld: 113 mg/dL — ABNORMAL HIGH (ref 70–99)
Potassium: 3.5 mmol/L (ref 3.5–5.1)
Sodium: 135 mmol/L (ref 135–145)

## 2023-05-12 LAB — CBC
HCT: 25.6 % — ABNORMAL LOW (ref 36.0–46.0)
Hemoglobin: 7.8 g/dL — ABNORMAL LOW (ref 12.0–15.0)
MCH: 26.4 pg (ref 26.0–34.0)
MCHC: 30.5 g/dL (ref 30.0–36.0)
MCV: 86.5 fL (ref 80.0–100.0)
Platelets: 210 10*3/uL (ref 150–400)
RBC: 2.96 MIL/uL — ABNORMAL LOW (ref 3.87–5.11)
RDW: 17.1 % — ABNORMAL HIGH (ref 11.5–15.5)
WBC: 7.3 10*3/uL (ref 4.0–10.5)
nRBC: 0 % (ref 0.0–0.2)

## 2023-05-12 MED ORDER — SODIUM CHLORIDE 0.9% FLUSH: 10.0000 mL | INTRAVENOUS | Status: DC | PRN

## 2023-05-12 MED ORDER — CHLORHEXIDINE GLUCONATE CLOTH 2 % EX PADS
6.0000 | MEDICATED_PAD | Freq: Every day | CUTANEOUS | Status: DC
Start: 2023-05-12 — End: 2023-05-13
  Administered 2023-05-12 – 2023-05-13 (×2): 6 via TOPICAL

## 2023-05-12 MED ORDER — POTASSIUM CHLORIDE 20 MEQ PO PACK
40.0000 meq | PACK | Freq: Once | ORAL | Status: AC
  Administered 2023-05-12: 40 meq via ORAL
  Filled 2023-05-12: qty 2

## 2023-05-12 MED ORDER — SODIUM CHLORIDE 0.9 % IV SOLN
300.0000 mg | INTRAVENOUS | Status: DC
  Administered 2023-05-12: 300 mg via INTRAVENOUS
  Filled 2023-05-12 (×2): qty 15

## 2023-05-12 MED ORDER — HYDRALAZINE HCL 10 MG PO TABS
10.0000 mg | ORAL_TABLET | Freq: Two times a day (BID) | ORAL | Status: DC
  Administered 2023-05-12 – 2023-05-14 (×4): 10 mg via ORAL
  Filled 2023-05-12 (×4): qty 1

## 2023-05-12 MED ORDER — IRON SUCROSE 300 MG IVPB - SIMPLE MED: 300.0000 mg | Status: DC

## 2023-05-12 MED ORDER — SODIUM CHLORIDE 0.9% FLUSH
10.0000 mL | Freq: Two times a day (BID) | INTRAVENOUS | Status: DC
  Administered 2023-05-13 – 2023-05-16 (×6): 10 mL

## 2023-05-12 MED ORDER — IRON SUCROSE 300 MG IVPB - SIMPLE MED
300.0000 mg | Status: DC
  Filled 2023-05-12: qty 265

## 2023-05-12 NOTE — Plan of Care (Signed)
Problem: Consults Goal: RH GENERAL PATIENT EDUCATION Description: See Patient Education module for education specifics. Outcome: Progressing   Problem: RH BOWEL ELIMINATION Goal: RH STG MANAGE BOWEL WITH ASSISTANCE Description: STG Manage Bowel with toileting Assistance. Outcome: Progressing Goal: RH STG MANAGE BOWEL W/MEDICATION W/ASSISTANCE Description: STG Manage Bowel with Medication with mod I Assistance. Outcome: Progressing   Problem: RH BLADDER ELIMINATION Goal: RH STG MANAGE BLADDER WITH ASSISTANCE Description: STG Manage Bladder With toileting Assistance Outcome: Progressing   Problem: RH SKIN INTEGRITY Goal: RH STG SKIN FREE OF INFECTION/BREAKDOWN Description: Manage skin w min assist Outcome: Progressing Goal: RH STG MAINTAIN SKIN INTEGRITY WITH ASSISTANCE Description: STG Maintain Skin Integrity With min Assistance. Outcome: Progressing Goal: RH STG ABLE TO PERFORM INCISION/WOUND CARE W/ASSISTANCE Description: STG Able To Perform Incision/Wound Care With min Assistance. Outcome: Progressing   Problem: RH SAFETY Goal: RH STG ADHERE TO SAFETY PRECAUTIONS W/ASSISTANCE/DEVICE Description: STG Adhere to Safety Precautions With cues  Assistance/Device. Outcome: Progressing   Problem: RH PAIN MANAGEMENT Goal: RH STG PAIN MANAGED AT OR BELOW PT'S PAIN GOAL Description: < 4 with prns Outcome: Progressing   Problem: RH KNOWLEDGE DEFICIT GENERAL Goal: RH STG INCREASE KNOWLEDGE OF SELF CARE AFTER HOSPITALIZATION Description: Patient and family will be able to manage care at discharge using educational resources for medication, skin care and dietary modification independently Outcome: Progressing

## 2023-05-12 NOTE — Progress Notes (Signed)
Occupational Therapy Session Note  Patient Details  Name: Peggy House MRN: [id number] Date of Birth: Nov 17, 1960  Today's Date: 05/12/2023 OT Individual Time: 0865-7846 & 1400-1510 OT Individual Time Calculation (min): 55 min & 70 min   Short Term Goals: Week 1:  OT Short Term Goal 1 (Week 1): Patient will improve UB strength to 4/5MMT at 95% safe OT Short Term Goal 2 (Week 1): The pt will complete LB bathing and dressing at MinA at 95% safe OT Short Term Goal 3 (Week 1): The pt will complete functional t/f's to all surfaces with ModI using RW at 95% safe OT Short Term Goal 4 (Week 1): The pt will adhere to RLE precaution of NWB 100% of the time without v/c's or reminders. OT Short Term Goal 5 (Week 1): The pt will tolerate > 40 minutes of functional activity at 95% safe to improve her functional outcome.  Skilled Therapeutic Interventions/Progress Updates:  Session 1 Skilled OT intervention completed with focus on ADL retraining, CAM boot education and functional transfers. Pt received upright in bed, agreeable to session. No pain reported.  Pt with RLE cast removed with skin noted to be dry/cracking. OT washed RLE with warm soap and water and applied lotion dependently for time and skin integrity. Transitioned to EOB with supervision increased effort. Pt threaded pants over BLE with supervision and + time. Education provided about CAM boot order for RLE during mobility, purpose, and donning/doffing strategies. For time, OT donned dependently with pt observing, but did discuss technique of 1/2 circle sitting for self-donning and plan to trial in future. Donned tennis shoe on LLE that family brought per OT request for efficiency with heel <> toe translation during transfers.  From elevated bed, pt stood using RW with 1-3 counting method, requiring min A to power up, then despite pt denial, OT suspected incontinent episode and doffed brief with max A. Pt heavily incontinent of urinary  void with pt unaware. Discussed timed toileting strategies to manage baseline incontinence. Stood again with min A using RW, this time with difficulty managing RLE NWB with CAM boot, with up to min A needed for balance during completion of self-pericare and threading of new brief with max A. Pt did lose balance posteriorly with attempt to resit at EOB and needed assist to safely position at EOB. Discussed avoidance of this habit for fall prevention and safety. Stood again with min A using RW, then with max cues, pt completed min A stand pivot > w/c using RW however poor NWB adherence on RLE, with pt using RLE (despite cues to avoid) to step and advance LLE during transfer. Pt reports Lt knee needs replacing and pivots without use of RLE has become very difficult for her. MD present and notified of NWB difficulty with CAM boot.  Seated at sink, pt was set up A for oral care, UB bathing/dressing. Pt remained seated in w/c, with chair alarm on/activated, and with all needs in reach at end of session.  Session 2 Skilled OT intervention completed with focus on ADL retraining, activity tolerance, cardiovascular endurance, functional transfers. Pt received seated in w/c, agreeable to session. No pain reported.  Pt declined need to void, however based on this mornings incontinence and pt stating she hadn't been taken to the bathroom all day, OT advised pt to try and pt agreeable. Transported dependently into bathroom. Completed min A sit > stand and CGA stand pivot with grab bar to toilet with increased effort to pivot on LLE. Mod  A needed to lower clothing. Pt was immediately continent of urinary void. Able to wipe seated with supervision. Min A sit > stand using grab bar, then mod A to donn clothing over hips with CGA for balance. Min A increasing to mod A for stand pivot to w/c with grab bar as when pt becomes fatigued in stance, she does not attempt to pivot on LLE rather, reaching and pulling herself into the  w/c Mod A needed for positioning hips for safety. Seated at sink, pt was set up A for hand hygiene.   Transported dependently in w/c <> gym for energy conservation. Pt completed the following intervals while seated at the SCIFIT to promote global/BUE endurance needed for independence with BADLs and functional transfers: -3 min, level 1, forwards -3 min, level 1, forwards -3 min, level 1, backwards -3 min, level 1, backwards Intermittent rest breaks needed for fatigue  Seated in w/c, pt completed the following BUE exercises to promote BUE strength/endurance needed for independence with functional transfers and BADLs: (With red theraband) 12 reps Horizontal abduction Self-anchored bicep flexion each arm Shoulder external rotation Shoulder diagonal pulls  Back in room, pt remained seated in w/c, with chair alarm on/activated, and with all needs in reach at end of session.   Therapy Documentation Precautions:  Precautions Precautions: Fall Precaution Comments: RLE NWB Required Braces or Orthoses: Other Brace Other Brace: RLE CAM boot Restrictions Weight Bearing Restrictions Per Provider Order: Yes RLE Weight Bearing Per Provider Order: Non weight bearing    Therapy/Group: Individual Therapy  Melvyn Novas, MS, OTR/L  05/12/2023, 3:14 PM

## 2023-05-12 NOTE — Progress Notes (Signed)
Physical Therapy Session Note  Patient Details  Name: Peggy House MRN: [id number] Date of Birth: [birth year]  Today's Date: 05/12/2023 PT Individual Time: 1191-4782 PT Individual Time Calculation (min): 71 min   Short Term Goals: Week 1:  PT Short Term Goal 1 (Week 1): Pt will complete bed mobility with CGA PT Short Term Goal 2 (Week 1): Pt will complete bed<>chair transfers with minA and LRAD PT Short Term Goal 3 (Week 1): Pt will hop 31ft with minA and LRAD  Skilled Therapeutic Interventions/Progress Updates:   Received pt sitting in WC eating breakfast. Pt agreeable to PT treatment and reported pain 8/10 in R ankle and L knee with mobility - RN notified and present to adminster pain medication. Session with emphasis on functional mobility/transfers, generalized strengthening and endurance, ROM, and dynamic standing balance/coordination.  Pt performed WC mobility 161ft using BUE and supervision to dayroom with emphasis on UE strength. Stood from [facility] with RW and mod A and attempted to ambulate, however pt unable to clear L foot (shoe sticking) and reporting increased pain in L knee. Deferred ambulation and attempted stand<>pivot onto mat (stood with RW and min A), however again pt unable to pivot on L foot and c/o increasing L knee pain. Removed shoe, stood with RW and min A, and performed stand<>pivot onto mat with RW and min A. Removed CAM boot and worked on the following exercises with emphasis on R ankle ROM and strength: -R ankle inversion/eversion 2x20 -R ankle PF with yellow TB 2x20 -R ankle DF with yellow TB 2x20 Pt required frequent rest/water breaks throughout session. Stood from [facility] with RW and min A x 2 trials and worked on Librarian, academic horseshoes with LUE x 2 trials with emphasis on maintaining RLE NWB precautions. Transferred into WC via stand<>pivot with RW and min A and transported back to room. Concluded session with pt sitting in [facility] with all needs  within reach.   Therapy Documentation Precautions:  Precautions Precautions: Fall Precaution Comments: RLE NWB Restrictions Weight Bearing Restrictions Per Provider Order: Yes RLE Weight Bearing Per Provider Order: Non weight bearing  Therapy/Group: Individual Therapy Marlana Salvage Zaunegger Blima Rich PT, DPT 05/12/2023, 6:58 AM

## 2023-05-12 NOTE — Progress Notes (Signed)
Physical Therapy Weekly Progress Note  Patient Details  Name: Peggy House MRN: [id number] Date of Birth: Jan 31, 1961  Beginning of progress report period: May 03, 2023 End of progress report period: May 12, 2023  Patient has met 3 of 3 short term goals. Pt demonstrates gradual progress towards long term goals. Pt is currently able to perform bed mobility with supervision and increased time/effort, sit<>stands with RW and min A (mod A from lower surfaces), stand<>pivots with RW and min A, ambulate up to 20ft with RW and min A, and perform WC mobility 111ft using BUE and supervision. Pt demonstrates good adherence to RLE NWB precautions but continues to be limited by pain in R ankle, global weakness/deconditioning, and decreased balance/coordination.   Patient continues to demonstrate the following deficits muscle weakness and muscle joint tightness, decreased cardiorespiratoy endurance, and decreased standing balance, decreased postural control, decreased balance strategies, and difficulty maintaining precautions and therefore will continue to benefit from skilled PT intervention to increase functional independence with mobility.  Patient progressing toward long term goals..  Continue plan of care.  PT Short Term Goals Week 1:  PT Short Term Goal 1 (Week 1): Pt will complete bed mobility with CGA PT Short Term Goal 1 - Progress (Week 1): Met PT Short Term Goal 2 (Week 1): Pt will complete bed<>chair transfers with minA and LRAD PT Short Term Goal 2 - Progress (Week 1): Met PT Short Term Goal 3 (Week 1): Pt will hop 65ft with minA and LRAD PT Short Term Goal 3 - Progress (Week 1): Met Week 2:  PT Short Term Goal 1 (Week 2): STG=LTG due to LOS  Skilled Therapeutic Interventions/Progress Updates:  Ambulation/gait training;Discharge planning;Cognitive remediation/compensation;DME/adaptive equipment instruction;Functional mobility training;Pain management;Psychosocial  support;Splinting/orthotics;Therapeutic Activities;UE/LE Strength taining/ROM;Visual/perceptual remediation/compensation;Wheelchair Designer, jewellery;Therapeutic Exercise;Patient/family education;Neuromuscular re-education;Functional electrical stimulation;Community reintegration;Balance/vestibular training;Disease management/prevention   Therapy Documentation Precautions:  Precautions Precautions: Fall Precaution Comments: RLE NWB Restrictions Weight Bearing Restrictions Per Provider Order: Yes RLE Weight Bearing Per Provider Order: Non weight bearing  Therapy/Group: Individual Therapy Marlana Salvage Zaunegger Blima Rich PT, DPT 05/12/2023, 8:45 AM

## 2023-05-12 NOTE — Progress Notes (Signed)
Discussed iron supplementation with Dr. Mosetta Putt and patient. She last received Ferrlecit on 12/20. Venofer on formulary here per pharmacist and ordered 300 mg/week X 3 weeks. IV team consulted for Port-a-cath access. Patient in agreement.  Iron/TIBC/Ferritin/ %Sat    Component Value Date/Time   IRON 19 (L) 05/09/2023 1517   IRON 19 (L) 02/05/2017 1407   TIBC 326 05/09/2023 1517   TIBC 284 02/05/2017 1407   FERRITIN 14 05/09/2023 1517   FERRITIN 18 02/05/2017 1407   IRONPCTSAT 6 (L) 05/09/2023 1517   IRONPCTSAT 7 (L) 02/05/2017 1407

## 2023-05-12 NOTE — Progress Notes (Signed)
Occupational Therapy Weekly Progress Note  Patient Details  Name: Peggy House MRN: [id number] Date of Birth: 15-May-1960  Beginning of progress report period: May 03, 2023 End of progress report period: May 12, 2023  Patient has met 3 of 5 short term goals. Pt is making gradual progress towards LTGs. She is able to bathe at an overall min A level, dress with min/mod A and requires mod assist for toileting tasks. Pt continues to demonstrate global deconditioning, GM coordination, and dynamic balance deficits with new challenge with NWB on RLE resulting in difficulty completing BADL tasks without increased physical assist. Pt will benefit from continued skilled OT services to focus on mentioned deficits. Family ed not initiated as of date and will be needed in prep for DC.   Patient continues to demonstrate the following deficits: muscle weakness, decreased cardiorespiratoy endurance, decreased coordination, decreased initiation, decreased awareness, decreased problem solving, and decreased safety awareness, and decreased standing balance and difficulty maintaining precautions and therefore will continue to benefit from skilled OT intervention to enhance overall performance with BADL and Reduce care partner burden.  Patient not progressing toward long term goals.  See goal revision..  Plan of care revisions: goals down graded to supervision for functional needs as well as mild cognitive deficits.  OT Short Term Goals Week 1:  OT Short Term Goal 1 (Week 1): Patient will improve UB strength to 4/5MMT at 95% safe OT Short Term Goal 1 - Progress (Week 1): Met OT Short Term Goal 2 (Week 1): The pt will complete LB bathing and dressing at MinA at 95% safe OT Short Term Goal 2 - Progress (Week 1): Met OT Short Term Goal 3 (Week 1): The pt will complete functional t/f's to all surfaces with ModI using RW at 95% safe OT Short Term Goal 3 - Progress (Week 1): Not met OT Short Term Goal 4  (Week 1): The pt will adhere to RLE precaution of NWB 100% of the time without v/c's or reminders. OT Short Term Goal 4 - Progress (Week 1): Not met OT Short Term Goal 5 (Week 1): The pt will tolerate > 40 minutes of functional activity at 95% safe to improve her functional outcome. OT Short Term Goal 5 - Progress (Week 1): Met Week 2:  OT Short Term Goal 1 (Week 2): Pt will maintain NWB on RLE with supervision 100% of time OT Short Term Goal 2 (Week 2): Pt will complete sit > stand using LRAD with CGA OT Short Term Goal 3 (Week 2): Pt will donn LB clothing with CGA using AE PRN OT Short Term Goal 4 (Week 2): Pt will donn RLE CAM boot with mod A    Jacci Ruberg E Mykenzi Vanzile, MS, OTR/L  05/12/2023, 12:56 PM

## 2023-05-12 NOTE — Progress Notes (Signed)
PROGRESS NOTE   Subjective/Complaints: No new complaints this morning Working with [facility]- no new concerns noted Ortho note reviewed and CAM boot and NWB status both recommended    ROS:  Right ankle pain-controlled   Pt denies SOB, abd pain, CP, N/V/C/D, and vision changes   Objective:   No results found. Recent Labs    05/10/23 0629 05/12/23 0544  WBC 6.7 7.3  HGB 7.7* 7.8*  HCT 25.7* 25.6*  PLT 210 210    Recent Labs    05/10/23 0629 05/12/23 0544  NA 140 135  K 3.8 3.5  CL 107 106  CO2 24 20*  GLUCOSE 117* 113*  BUN 12 11  CREATININE 1.17* 1.06*  CALCIUM 8.8* 8.3*     Intake/Output Summary (Last 24 hours) at 05/12/2023 1147 Last data filed at 05/11/2023 2208 Gross per 24 hour  Intake 320 ml  Output --  Net 320 ml        Physical Exam: Vital Signs Blood pressure (!) 129/53, pulse (!) 51, temperature 98 F (36.7 C), resp. rate 16, height 5\' 4"  (1.626 m), weight 102.7 kg, SpO2 100%.      General: awake, alert, appropriate, sitting up in bed; NAD HENT: conjugate gaze; oropharynx moist CV: regular rhythm, bradycardic rate; no JVD Pulmonary: CTA B/L; no W/R/R- good air movement GI: soft, NT, ND, (+)BS- more normoactive Psychiatric: appropriate Neurological: Ox3 Extremities; in Boot on RLE/ Ankle- steristrips on medial ankle  Musculoskeletal:        General: Swelling present. Normal range of motion.     Cervical back: Normal range of motion.     Comments: Right leg in splint  Skin: No breakdown noted in the visible portion Neurological:     Mental Status: She is alert.     Comments: Alert and oriented x 3. Normal insight and awareness. Intact Memory. Normal language and speech. Cranial nerve exam unremarkable. MMT: UE 4/5 prox to 4+/5 distal. RLE- 2/5 (pain), able to wiggle toes. LLE 3/5 HF, KE and 4+/5 ADF/PF.  Sensory exam normal for light touch and pain in all 4 limbs. No limb ataxia or  cerebellar signs. No abnormal tone appreciated.   Stable 05/12/23 Psychiatric:        Mood and Affect: Mood normal.        Behavior: Behavior normal.      Assessment/Plan: 1. Functional deficits which require 3+ hours per day of interdisciplinary therapy in a comprehensive inpatient rehab setting. Physiatrist is providing close team supervision and 24 hour management of active medical problems listed below. Physiatrist and rehab team continue to assess barriers to discharge/monitor patient progress toward functional and medical goals  Care Tool:  Bathing    Body parts bathed by patient: Right arm, Left arm, Chest, Abdomen, Front perineal area, Buttocks, Right upper leg, Left upper leg, Right lower leg, Left lower leg, Face   Body parts bathed by helper: Left lower leg, Right lower leg     Bathing assist Assist Level: Supervision/Verbal cueing     Upper Body Dressing/Undressing Upper body dressing   What is the patient wearing?: Pull over shirt    Upper body assist Assist Level: Set up  assist    Lower Body Dressing/Undressing Lower body dressing      What is the patient wearing?: Underwear/pull up, Pants     Lower body assist Assist for lower body dressing: Minimal Assistance - Patient > 75%     Toileting Toileting    Toileting assist Assist for toileting: Moderate Assistance - Patient 50 - 74% (ModA for bring threading the items onto her lets and pulling them up.)     Transfers Chair/bed transfer  Transfers assist     Chair/bed transfer assist level: Minimal Assistance - Patient > 75%     Locomotion Ambulation   Ambulation assist      Assist level: Minimal Assistance - Patient > 75% Assistive device: Walker-rolling Max distance: 15   Walk 10 feet activity   Assist  Walk 10 feet activity did not occur: Safety/medical concerns (Per PT Eval)  Assist level: Minimal Assistance - Patient > 75% Assistive device: Walker-rolling   Walk 50 feet  activity   Assist Walk 50 feet with 2 turns activity did not occur: Safety/medical concerns         Walk 150 feet activity   Assist Walk 150 feet activity did not occur: Safety/medical concerns         Walk 10 feet on uneven surface  activity   Assist Walk 10 feet on uneven surfaces activity did not occur: Safety/medical concerns         Wheelchair     Assist Is the patient using a wheelchair?: Yes Type of Wheelchair: Manual    Wheelchair assist level: Supervision/Verbal cueing Max wheelchair distance: 100'    Wheelchair 50 feet with 2 turns activity    Assist        Assist Level: Supervision/Verbal cueing   Wheelchair 150 feet activity     Assist      Assist Level: Supervision/Verbal cueing   Blood pressure (!) 129/53, pulse (!) 51, temperature 98 F (36.7 C), resp. rate 16, height 5\' 4"  (1.626 m), weight 102.7 kg, SpO2 100%.  Medical Problem List and Plan: 1. Functional deficits secondary to right trimalleolar ankle fx after syncopal episode.              -patient may shower if RLE is covered             -ELOS/Goals: 8-13 days,  mod I to supervision goals with PT and OT  -est DC 2/1  Continue CIR PT and OT   2.  Antithrombotics: SCDs ordered. Bilateral lower extremities (chemo prophy d/c due to epistaxis, anemia)             -antiplatelet therapy: none   3. Pain Management:  -continue Tylenol 1000 mg q 8 hours             -continue oxycodone 10 mg q 4 hours prn  -1/23-1/25 pain controlled, continue current    1/26- pain controlled with meds 4. Chronic depression/Insomnia: LCSW to evaluate and provide emotional support             -continue Celexa 20 mg daily             -trazodone 50 mg q HS prn sleep             -pt was on at bedtime remeron and xanax prn at home (not resumed)             -antipsychotic agents: n/a   5. Neuropsych/cognition: This patient is capable of making decisions on her own  behalf.   6. Skin/Wound Care:  Routine skin care checks   7. Fluids/Electrolytes/Nutrition: Routine Is and Os and follow-up chemistries   8: Hypertension: monitor TID and prn             -continue amlodipine 10 mg daily             -continue lisinopril 20 mg daily  -1/21 fair control continue to monitor trend for now  1/25- BP ocntrolled- Cards increased hydralazine to 10 mg TID when stopped Norvasc  1/26- Pt's BP up to 150s-170s since Norvasc stopped- if BP doesn't come down in next 24 hours, suggest increase of Hydralazine to 25 mg TID- will order prn' hydralazine 25 mg q8 hours prn as well if gets >180/100    05/12/2023    5:46 AM 05/11/2023    9:34 PM 05/11/2023    8:14 PM  Vitals with BMI  Systolic 129 156 865  Diastolic 53 67 67  Pulse   51      9: Hyperlipidemia: continue statin   10: GERD: continue PPI   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 Pt [facility]             -follow-up CBC             -goal to keep hgb >8  -h/h stable   1/25- Hb 7.7- is overall a little less, but was running 8.1 or so- will monitor 14: Intermittent epistaxis due to #11: - Afrin, other measures as needed             -consider ENT consultation for hemostasis              15: Right ankle fracture s/p ORIF 1/19, Dr. Hulda Humphrey (has nylon sutures and short leg splint with posterior slab and stirrups)             -NWB for 8-10 weeks             -follow-up ortho in 2-3 weeks post-op   1/26- got 1/2 sutures out- asked Charge nurse to see if can get rest of sutures out/find them? 16: Tobacco use: cessation counseling   17: Code status: DNR-limited       19: DM-2: A1c = 5.0% October 2024  (glucoses ~100-120s on regular diet)             -home metformin 500 mg BID not restarted             -d/c CBGs  D/c Boost  -Glucose stable on BMP today 1/20   20: Thrombocytosis: follow-up CBC   21: New onset nausea:             -pt reports that it came on after FFP yesterday afternoon.             -had large bm this morning with no improvement in symptoms              -continue with prn tigan, observe for now, belly is benign             -  I don't see any other obvious causes the moment.   22. Diastolic hypotension: decrease lisinopril to 10mg  daily.  23. Systolic hypertension: increase magnesium gluconate to 500mg  HS, continue amlodipine 10mg  daily  -1/22 BP intermittently elevated but controlled this morning, continue to monitor  -1/23 Consider additional medication if remains elevated   1/26- Cards stopped Norvasc- and increased hydralzine to 10 mg TID_ might need increase in Hydralazine vs Lisinopril    05/12/2023    5:46 AM 05/11/2023    9:34 PM 05/11/2023    8:14 PM  Vitals with BMI  Systolic 129 156 161  Diastolic 53 67 67  Pulse   51     24. Constipation: Last BM 1/17, d/c fleet enema, magnesium gluconate increased to 500mg  HS  -1/20 Continue Senokot, MiraLAX twice daily, sorbitol as needed.  Consider additional medication if no BM by tomorrow  1/21 BM today improved, decrease Senokot frequency from twice a day to once a day  1/23 LBM yesterday stable  1/25- LBM 3 days ago- if no BM by tomorrow, will intervene  1/26- LBM medium type 6 yesterday 25. Recent AKI: decrease lisinopril to 10mg  daily and repeat creatinine tomorrow.   -10/20 creatinine BUN stable at 0.93/10   26. Hypotension: decrease hydralazine to 10mg  BID  27. AKI  1/25- Cr up to 1.27- will push fluids and recheck Monday- asked nursing to specifically push 6-8 cups/day  Decrease hydralazine to 10mg  BID  28. Bradycardia: decrease hydralazine to 10mg  BID  LOS: 10 days A FACE TO FACE EVALUATION WAS  PERFORMED  Drema Pry Jones Viviani 05/12/2023, 11:47 AM

## 2023-05-12 NOTE — Progress Notes (Signed)
Patient ID: Peggy House, female   DOB: 1960-09-19, 63 y.o.   MRN: [id number]  [id number]- SW left message for pt son Clide Cliff reviewing updates from team meeting, inform on d/c date, discussion about scheduling family, and waiting on follow-up. SW also left a message for pt son Ludger Nutting asking if he can relay message to pt brother. SW made an effort to make contact with pt son Ivin Booty but no answer on phone line, rings and then unable to connect. SW will continue to make efforts  Cecile Sheerer, MSW, LCSW Office: [phone] Cell: [phone] Fax: [phone]

## 2023-05-12 NOTE — Plan of Care (Signed)
  Problem: RH Balance Goal: LTG Patient will maintain dynamic standing with ADLs (OT) Description: LTG:  Patient will maintain dynamic standing balance with assist during activities of daily living (OT)  Flowsheets (Taken 05/12/2023 1257) LTG: Pt will maintain dynamic standing balance during ADLs with: (goals downgraded due to dynamic balance, GM coordination, and mild cognitive deficits) Supervision/Verbal cueing   Problem: Sit to Stand Goal: LTG:  Patient will perform sit to stand in prep for activites of daily living with assistance level (OT) Description: LTG:  Patient will perform sit to stand in prep for activites of daily living with assistance level (OT) Flowsheets (Taken 05/12/2023 1257) LTG: PT will perform sit to stand in prep for activites of daily living with assistance level: (goals downgraded due to dynamic balance, GM coordination, and mild cognitive deficits) Supervision/Verbal cueing   Problem: RH Bathing Goal: LTG Patient will bathe all body parts with assist levels (OT) Description: LTG: Patient will bathe all body parts with assist levels (OT) Flowsheets (Taken 05/12/2023 1257) LTG: Pt will perform bathing with assistance level/cueing: (goals downgraded due to dynamic balance, GM coordination, and mild cognitive deficits) Supervision/Verbal cueing   Problem: RH Dressing Goal: LTG Patient will perform lower body dressing w/assist (OT) Description: LTG: Patient will perform lower body dressing with assist, with/without cues in positioning using equipment (OT) Flowsheets (Taken 05/12/2023 1257) LTG: Pt will perform lower body dressing with assistance level of: (goals downgraded due to dynamic balance, GM coordination, and mild cognitive deficits) Supervision/Verbal cueing   Problem: RH Toileting Goal: LTG Patient will perform toileting task (3/3 steps) with assistance level (OT) Description: LTG: Patient will perform toileting task (3/3 steps) with assistance level (OT)   Flowsheets (Taken 05/12/2023 1257) LTG: Pt will perform toileting task (3/3 steps) with assistance level: (goals downgraded due to dynamic balance, GM coordination, and mild cognitive deficits) Supervision/Verbal cueing   Problem: RH Toilet Transfers Goal: LTG Patient will perform toilet transfers w/assist (OT) Description: LTG: Patient will perform toilet transfers with assist, with/without cues using equipment (OT) Flowsheets (Taken 05/12/2023 1257) LTG: Pt will perform toilet transfers with assistance level of: (goals downgraded due to dynamic balance, GM coordination, and mild cognitive deficits) Supervision/Verbal cueing   Problem: RH Tub/Shower Transfers Goal: LTG Patient will perform tub/shower transfers w/assist (OT) Description: LTG: Patient will perform tub/shower transfers with assist, with/without cues using equipment (OT) Flowsheets (Taken 05/12/2023 1257) LTG: Pt will perform tub/shower stall transfers with assistance level of: (goals downgraded due to dynamic balance, GM coordination, and mild cognitive deficits) Supervision/Verbal cueing

## 2023-05-13 DIAGNOSIS — S82851D Displaced trimalleolar fracture of right lower leg, subsequent encounter for closed fracture with routine healing: Secondary | ICD-10-CM | POA: Diagnosis not present

## 2023-05-13 LAB — BASIC METABOLIC PANEL
Anion gap: 7 (ref 5–15)
BUN: 9 mg/dL (ref 8–23)
CO2: 23 mmol/L (ref 22–32)
Calcium: 8.5 mg/dL — ABNORMAL LOW (ref 8.9–10.3)
Chloride: 111 mmol/L (ref 98–111)
Creatinine, Ser: 0.82 mg/dL (ref 0.44–1.00)
GFR, Estimated: >60 mL/min (ref >60)
Glucose, Bld: 105 mg/dL — ABNORMAL HIGH (ref 70–99)
Potassium: 3.8 mmol/L (ref 3.5–5.1)
Sodium: 141 mmol/L (ref 135–145)

## 2023-05-13 MED ORDER — POTASSIUM CHLORIDE 20 MEQ PO PACK
20.0000 meq | PACK | Freq: Two times a day (BID) | ORAL | Status: DC
  Administered 2023-05-13 – 2023-05-19 (×13): 20 meq via ORAL
  Filled 2023-05-13 (×13): qty 1

## 2023-05-13 MED ORDER — LORAZEPAM 0.5 MG PO TABS: 0.5000 mg | ORAL_TABLET | Freq: Four times a day (QID) | ORAL | Status: DC | PRN

## 2023-05-13 MED ORDER — CHLORHEXIDINE GLUCONATE CLOTH 2 % EX PADS
6.0000 | MEDICATED_PAD | Freq: Two times a day (BID) | CUTANEOUS | Status: DC
  Administered 2023-05-13 – 2023-05-19 (×12): 6 via TOPICAL

## 2023-05-13 NOTE — Progress Notes (Addendum)
Physical Therapy Session Note  Patient Details  Name: Peggy House MRN: [id number] Date of Birth: 09/08/1960  Today's Date: 05/13/2023 PT Individual Time: 4098-1191 PT Individual Time Calculation (min): 43 min   Short Term Goals: Week 2:  PT Short Term Goal 1 (Week 2): STG=LTG due to LOS  Skilled Therapeutic Interventions/Progress Updates:      Therapy Documentation Precautions:  Precautions Precautions: Fall Precaution Comments: RLE NWB Required Braces or Orthoses: Other Brace Other Brace: RLE CAM boot Restrictions Weight Bearing Restrictions Per Provider Order: Yes RLE Weight Bearing Per Provider Order: Non weight bearing General:   Pt received supine in bed and agreeable to therapy session. Pt denies pain at this time.   PT and SPT assisted pt in moving pillows to allow for bed mobility. L TED hose and sock and R CAM boot donned dependently. Pt performed R rolling and supine <>sit with SBA. Pt requested assistance with brief change and peri care. Pt performed sit<>stand from elevated surface with RW and minA with verbal cues to avoid WB through R LE. PT dependently performed brief change and peri care with pt standing with CGA with RW on LLE. Pt had increased difficulty with maintaining WB status today and reported increased fatigue after iron injection yesterday.  Pt performed stand pivot transfer to w/c with CGA. Pt dependently transported to ADL apartment. Pt navigated kitchen environment with supervision and v/c for techniques to allow independence while accessing kitchen features such as the refrigerator. Pt given v/c to avoid propelling too close to doorways when turning to avoid hitting hand along the push rim.  Pt propelled w/c  >200' back to room with supervision, increased time, and verbal cues for wheelchair propulsion efficiency. Discussed energy conservation when pt performs w/c mobility for necessary community mobility such as medical appointments.   Pt  left seated in w/c in room with all needs in reach and seat belt alarm on.     Therapy/Group: Individual Therapy  Collins Scotland 05/13/2023, 5:49 PM

## 2023-05-13 NOTE — Progress Notes (Signed)
Speech Language Pathology Weekly Progress and Session Note  Patient Details  Name: Peggy House MRN: [id number] Date of Birth: 1960/05/29  Beginning of progress report period: May 06, 2023 End of progress report period: May 13, 2023  Today's Date: 05/13/2023 SLP Individual Time: 8469-6295 SLP Individual Time Calculation (min): 46 min  Short Term Goals: Week 1: SLP Short Term Goal 1 (Week 1): Patient will recall and utilize memory compensatory strategies given min multimodal A SLP Short Term Goal 1 - Progress (Week 1): Not met SLP Short Term Goal 2 (Week 1): Patient will demonstrate problem solving in moderately complex situations given min multimodal A SLP Short Term Goal 2 - Progress (Week 1): Not met  New Short Term Goals: Week 2: SLP Short Term Goal 1 (Week 2): Patient will recall and utilize memory compensatory strategies given min multimodal A SLP Short Term Goal 2 (Week 2): Patient will demonstrate problem solving in moderately complex situations given min multimodal A  Weekly Progress Updates: Due to not being seen by SLP this reporting period no observable gains made. Currently pt continues to require min to mod A for recall and problem solving. Pt/ family education ongoing. Pt would benefit from continued skilled SLP intervention to maximize cognition in order to maximize her functional independence.  Intensity: Minumum of 1-2 x/day, 30 to 90 minutes Frequency: 1 to 3 out of 7 days Duration/Length of Stay: 2/1 Treatment/Interventions: Cognitive remediation/compensation;Functional tasks;Patient/family education;Internal/external aids  Daily Session  Skilled Therapeutic Interventions: Patient was seen in am to address cognitive re- training. Pt was alert and seen at bedside. She was agreeable for session. SLP introduced WRAP compensatory strategies and examples of utilization. She was subsequently challenged in paragraph retention of moderate level information.  SLP guided pt in repetition and association strategies. Given a 15 minute delay pt recalled information with 60% acc indep improving to 80% with min A. SLP also addressed problem solving through addressing medication management. SLP reviewed current medication list. Pt able to identify home medications vs unfamiliar meds. Pt identifying a discrepancy in current meds vs home meds and encouraged to f/u with MD during rounds. As MD rounded ~10 minutes later pt recalled concern indep and discussed with MD. Pt left in bed with call button within reach and bed alarm active. SLP plan to continue POC in upcoming sessions.      General    Pain Pain Assessment Pain Scale: 0-10 Pain Score: 0-No pain  Therapy/Group: Individual Therapy  Renaee Munda 05/13/2023, 12:44 PM

## 2023-05-13 NOTE — Progress Notes (Signed)
Occupational Therapy Session Note  Patient Details  Name: Peggy House MRN: [id number] Date of Birth: 12-22-1960  Today's Date: 05/13/2023 OT Individual Time: 4098-1191 & 1335-1415 OT Individual Time Calculation (min): 55 min & 40 min   Short Term Goals: Week 2:  OT Short Term Goal 1 (Week 2): Pt will maintain NWB on RLE with supervision 100% of time OT Short Term Goal 2 (Week 2): Pt will complete sit > stand using LRAD with CGA OT Short Term Goal 3 (Week 2): Pt will donn LB clothing with CGA using AE PRN OT Short Term Goal 4 (Week 2): Pt will donn RLE CAM boot with mod A  Skilled Therapeutic Interventions/Progress Updates:  Session 1 Skilled OT intervention completed with focus on ADL retraining, functional transfers, activity tolerance. Pt received upright in bed, agreeable to session. No pain reported.  Pt required prompting cues for toileting needs, but pt verbalized need to void. Transitioned to EOB with increased effort and supervision. Donned RLE CAM boot with total A for time. From elevated bed, pt stood on 2nd trial with heavy min A using RW, with better adherence to NWB on RLE, then min A stand pivot > w/c using RW with pt tapping RLE on floor to step hop with LLE and difficulty correcting despite cues.   In bathroom, pt completed min A sit > stand and CGA stand pivot with grab bar with heel > toe transition. Required mod A to doff soiled brief (incontinent of urine). Pt further continent of urinary void and BM. Independent with wiping seated. Pt required education/demo on technique of donning brief with fasteners but able to do with min A. Discussed using pull ups at home however disadvantage with pants management if pull up becomes soiled and has to be replaced. Donned new pants with assist for threading over BLE, then stood with CGA using grab bar, and min A for over hips. CGA stand pivot with grab bar > w/c.  Seated at sink, pt was set up A for oral care, hair grooming  and hand hygiene. Pt self-propelled in w/c about 125 ft using BUE with fatigue expressed, then OT took over to gym. Seated in, pt completed the following BUE exercises to promote BUE strength/endurance needed for independence with functional transfers and BADLs: (With 4 lb dowel) -15 chest press -10 overhead press -15 bicep curls  Back in room, pt remained seated in w/c, with chair alarm on/activated, and with all needs in reach at end of session.  Session 2 Skilled OT intervention completed with focus on functional transfers, dynamic balance with unilateral UE support. Pt received seated in w/c, agreeable to session. No pain reported.  Pt declined self-care needs. Transported dependently in w/c <> gym. Completed min A sit > stand with cues provided for anterior weight shifting and continuing the weight shift when powering up with RW. CGA stand pivot using RW > EOM however despite cues, pt with continued light tapping of RLE on floor vs NWB to increase leverage of LLE during transfer.  Pt participated in the following dynamic standing balance and endurance tasks to promote independence and safety during BADLs and functional mobility: -horse shoe toss activity. Min A sit > stand x3 using RW from mat at low level to simulate estimated bed height at home, and CGA needed for balance. No LOB, however seated rest breaks provided for fatigue  Min A stand pivot using RW > w/c, and similar transfer > EOB. Education provided on doffing technique for CAM  boot, however pt with difficulty locating straps due to hamstring tightness and pt fatigue. Max A needed. Supervision transition to upright in bed with RLE propped on pillows. Pt remained upright in bed, with bed alarm on/activated, and with all needs in reach at end of session.   Therapy Documentation Precautions:  Precautions Precautions: Fall Precaution Comments: RLE NWB Required Braces or Orthoses: Other Brace Other Brace: RLE CAM  boot Restrictions Weight Bearing Restrictions Per Provider Order: Yes RLE Weight Bearing Per Provider Order: Non weight bearing    Therapy/Group: Individual Therapy  Melvyn Novas, MS, OTR/L  05/13/2023, 3:23 PM

## 2023-05-13 NOTE — Progress Notes (Incomplete)
Occupational Therapy Session Note  Patient Details  Name: Peggy House MRN: [id number] Date of Birth: 04-03-1961  {CHL IP REHAB OT TIME CALCULATIONS:[id number]}   Short Term Goals: Week 1:  OT Short Term Goal 1 (Week 2): Pt will maintain NWB on RLE with supervision 100% of time OT Short Term Goal 2 (Week 2): Pt will complete sit > stand using LRAD with CGA OT Short Term Goal 3 (Week 2): Pt will donn LB clothing with CGA using AE PRN OT Short Term Goal 4 (Week 2): Pt will donn RLE CAM boot with mod A  Skilled Therapeutic Interventions/Progress Updates:    Patient agreeable to participate in OT session. Reports *** pain level.   Patient participated in skilled OT session focusing on ***. Therapist facilitated/assessed/developed/educated/integrated/elicited *** in order to improve/facilitate/promote    Therapy Documentation Precautions:  Precautions Precautions: Fall Precaution Comments: RLE NWB Required Braces or Orthoses: Other Brace Other Brace: RLE CAM boot Restrictions Weight Bearing Restrictions Per Provider Order: Yes RLE Weight Bearing Per Provider Order: Non weight bearing  Therapy/Group: Individual Therapy  Limmie Patricia, OTR/L,CBIS  Supplemental OT - [facility] Secure Chat Preferred   05/13/2023, 10:35 PM

## 2023-05-13 NOTE — Progress Notes (Signed)
PROGRESS NOTE   Subjective/Complaints: No new complaints this morning Discussed starting iron  Discussed that CBGs are well controlled Ativan prn ordered for her anxiety  ROS:  Right ankle pain-controlled   Pt denies SOB, abd pain, CP, N/V/C/D, and vision changes   Objective:   No results found. Recent Labs    05/12/23 0544  WBC 7.3  HGB 7.8*  HCT 25.6*  PLT 210    Recent Labs    05/12/23 0544 05/13/23 0322  NA 135 141  K 3.5 3.8  CL 106 111  CO2 20* 23  GLUCOSE 113* 105*  BUN 11 9  CREATININE 1.06* 0.82  CALCIUM 8.3* 8.5*     Intake/Output Summary (Last 24 hours) at 05/13/2023 1152 Last data filed at 05/12/2023 1841 Gross per 24 hour  Intake 324.32 ml  Output --  Net 324.32 ml        Physical Exam: Vital Signs Blood pressure (!) 153/74, pulse (!) 51, temperature 98.4 F (36.9 C), temperature source Oral, resp. rate 18, height 5\' 4"  (1.626 m), weight 102.7 kg, SpO2 98%.      General: awake, alert, appropriate, sitting up in bed; NAD HENT: conjugate gaze; oropharynx moist CV: regular rhythm, bradycardic rate; no JVD Pulmonary: CTA B/L; no W/R/R- good air movement GI: soft, NT, ND, (+)BS- more normoactive Psychiatric: appropriate Neurological: Ox3 Extremities; in Boot on RLE/ Ankle- steristrips on medial ankle  Musculoskeletal:        General: Swelling present. Normal range of motion.     Cervical back: Normal range of motion.     Comments: Right leg in splint  Skin: No breakdown noted in the visible portion Neurological:     Mental Status: She is alert.     Comments: Alert and oriented x 3. Normal insight and awareness. Intact Memory. Normal language and speech. Cranial nerve exam unremarkable. MMT: UE 4/5 prox to 4+/5 distal. RLE- 2/5 (pain), able to wiggle toes. LLE 3/5 HF, KE and 4+/5 ADF/PF.  Sensory exam normal for light touch and pain in all 4 limbs. No limb ataxia or cerebellar  signs. No abnormal tone appreciated.  Stable 1/28//25 Psychiatric:        Mood and Affect: Mood normal.        Behavior: Behavior normal.      Assessment/Plan: 1. Functional deficits which require 3+ hours per day of interdisciplinary therapy in a comprehensive inpatient rehab setting. Physiatrist is providing close team supervision and 24 hour management of active medical problems listed below. Physiatrist and rehab team continue to assess barriers to discharge/monitor patient progress toward functional and medical goals  Care Tool:  Bathing    Body parts bathed by patient: Right arm, Left arm, Chest, Abdomen, Front perineal area, Buttocks, Right upper leg, Left upper leg, Right lower leg, Left lower leg, Face   Body parts bathed by helper: Left lower leg, Right lower leg     Bathing assist Assist Level: Supervision/Verbal cueing     Upper Body Dressing/Undressing Upper body dressing   What is the patient wearing?: Pull over shirt    Upper body assist Assist Level: Set up assist    Lower Body Dressing/Undressing Lower  body dressing      What is the patient wearing?: Underwear/pull up, Pants     Lower body assist Assist for lower body dressing: Minimal Assistance - Patient > 75%     Toileting Toileting    Toileting assist Assist for toileting: Minimal Assistance - Patient > 75%     Transfers Chair/bed transfer  Transfers assist     Chair/bed transfer assist level: Minimal Assistance - Patient > 75%     Locomotion Ambulation   Ambulation assist      Assist level: Minimal Assistance - Patient > 75% Assistive device: Walker-rolling Max distance: 15   Walk 10 feet activity   Assist  Walk 10 feet activity did not occur: Safety/medical concerns (Per PT Eval)  Assist level: Minimal Assistance - Patient > 75% Assistive device: Walker-rolling   Walk 50 feet activity   Assist Walk 50 feet with 2 turns activity did not occur: Safety/medical  concerns         Walk 150 feet activity   Assist Walk 150 feet activity did not occur: Safety/medical concerns         Walk 10 feet on uneven surface  activity   Assist Walk 10 feet on uneven surfaces activity did not occur: Safety/medical concerns         Wheelchair     Assist Is the patient using a wheelchair?: Yes Type of Wheelchair: Manual    Wheelchair assist level: Supervision/Verbal cueing Max wheelchair distance: 100'    Wheelchair 50 feet with 2 turns activity    Assist        Assist Level: Supervision/Verbal cueing   Wheelchair 150 feet activity     Assist      Assist Level: Supervision/Verbal cueing   Blood pressure (!) 153/74, pulse (!) 51, temperature 98.4 F (36.9 C), temperature source Oral, resp. rate 18, height 5\' 4"  (1.626 m), weight 102.7 kg, SpO2 98%.  Medical Problem List and Plan: 1. Functional deficits secondary to right trimalleolar ankle fx after syncopal episode.              -patient may shower if RLE is covered             -ELOS/Goals: 8-13 days,  mod I to supervision goals with PT and OT  -est DC 2/1  Continue CIR PT and OT   2.  Antithrombotics: SCDs ordered. Bilateral lower extremities (chemo prophy d/c due to epistaxis, anemia)             -antiplatelet therapy: none   3. Pain Management:  -continue Tylenol 1000 mg q 8 hours             -continue oxycodone 10 mg q 4 hours prn  -1/23-1/25 pain controlled, continue current    1/26- pain controlled with meds 4. Chronic depression/Insomnia: LCSW to evaluate and provide emotional support             -continue Celexa 20 mg daily             -trazodone 50 mg q HS prn sleep             -pt was on at bedtime remeron and xanax prn at home (not resumed)             -antipsychotic agents: n/a   5. Neuropsych/cognition: This patient is capable of making decisions on her own behalf.   6. Skin/Wound Care: Routine skin care checks   7.  Fluids/Electrolytes/Nutrition: Routine Is and Os  and follow-up chemistries   8: Hypertension: monitor TID and prn             -continue amlodipine 10 mg daily             -continue lisinopril 20 mg daily  -1/21 fair control continue to monitor trend for now  1/25- BP ocntrolled- Cards increased hydralazine to 10 mg TID when stopped Norvasc  1/26- Pt's BP up to 150s-170s since Norvasc stopped- if BP doesn't come down in next 24 hours, suggest increase of Hydralazine to 25 mg TID- will order prn' hydralazine 25 mg q8 hours prn as well if gets >180/100    05/13/2023    4:11 AM 05/12/2023    7:59 PM 05/12/2023    1:48 PM  Vitals with BMI  Systolic 153 168 119  Diastolic 74 79 63  Pulse 51 59 80      9: Hyperlipidemia: continue statin   10: GERD: continue PPI   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 Pt [facility]             -follow-up CBC             -goal to keep hgb >8  -h/h stable   1/25- Hb 7.7- is overall a little less, but was running 8.1 or so- will monitor 14: Intermittent epistaxis due to #11: - Afrin, other measures as needed             -consider ENT consultation for hemostasis              15: Right ankle fracture s/p ORIF 1/19, Dr. Hulda Humphrey (has nylon sutures and short leg splint with posterior slab and stirrups)             -NWB for 8-10 weeks             -follow-up ortho in 2-3 weeks post-op   1/26- got 1/2 sutures out- asked Charge nurse to see if can get rest of sutures out/find them? 16: Tobacco use: cessation counseling   17: Code status: DNR-limited       19: DM-2: A1c = 5.0% October 2024 (glucoses ~100-120s on  regular diet)             -home metformin 500 mg BID not restarted             -d/c CBGs  D/c Boost  -Glucose stable on BMP today 1/20   20: Thrombocytosis: follow-up CBC   21: New onset nausea:             -pt reports that it came on after FFP yesterday afternoon.             -had large bm this morning with no improvement in symptoms              -continue with prn tigan, observe for now, belly is benign             -I don't see any other obvious causes the moment.   22. Diastolic hypotension: decrease  lisinopril to 10mg  daily.  23. Systolic hypertension: increase magnesium gluconate to 500mg  HS, continue amlodipine 10mg  daily  -1/22 BP intermittently elevated but controlled this morning, continue to monitor  -1/23 Consider additional medication if remains elevated   1/26- Cards stopped Norvasc- and increased hydralzine to 10 mg TID_ might need increase in Hydralazine vs Lisinopril    05/13/2023    4:11 AM 05/12/2023    7:59 PM 05/12/2023    1:48 PM  Vitals with BMI  Systolic 153 168 086  Diastolic 74 79 63  Pulse 51 59 80     24. Constipation: Last BM 1/17, d/c fleet enema, magnesium gluconate increased to 500mg  HS  -1/20 Continue Senokot, MiraLAX twice daily, sorbitol as needed.  Consider additional medication if no BM by tomorrow  1/21 BM today improved, decrease Senokot frequency from twice a day to once a day  1/23 LBM yesterday stable  1/25- LBM 3 days ago- if no BM by tomorrow, will intervene  1/26- LBM medium type 6 yesterday 25. Recent AKI: decrease lisinopril to 10mg  daily and repeat creatinine tomorrow.   -10/20 creatinine BUN stable at 0.93/10   26. Hypotension: decrease hydralazine to 10mg  BID  27. AKI  Cr reviewed and resolved  Decrease hydralazine to 10mg  BID  28. Bradycardia: continue hydralazine to 10mg  BID  29. Iron deficiency anemia: Venofer ordered  20. Anxiety: prn ativan ordered  LOS: 11 days A FACE TO FACE EVALUATION WAS PERFORMED  Clint Bolder  P Roquel Burgin 05/13/2023, 11:52 AM

## 2023-05-14 DIAGNOSIS — S82899A Other fracture of unspecified lower leg, initial encounter for closed fracture: Secondary | ICD-10-CM | POA: Diagnosis not present

## 2023-05-14 DIAGNOSIS — S82851D Displaced trimalleolar fracture of right lower leg, subsequent encounter for closed fracture with routine healing: Secondary | ICD-10-CM | POA: Diagnosis not present

## 2023-05-14 MED ORDER — ONDANSETRON HCL 4 MG PO TABS
4.0000 mg | ORAL_TABLET | Freq: Three times a day (TID) | ORAL | Status: DC | PRN
  Administered 2023-05-14: 4 mg via ORAL
  Filled 2023-05-14: qty 1

## 2023-05-14 NOTE — Progress Notes (Signed)
PROGRESS NOTE   Subjective/Complaints: No new complaints this morning Feeling better with iron No issues overnight Continues to be bradycardic: hydralazine d/ced  ROS:  Right ankle pain-controlled, +fatigue   Objective:   No results found. Recent Labs    05/12/23 0544  WBC 7.3  HGB 7.8*  HCT 25.6*  PLT 210    Recent Labs    05/12/23 0544 05/13/23 0322  NA 135 141  K 3.5 3.8  CL 106 111  CO2 20* 23  GLUCOSE 113* 105*  BUN 11 9  CREATININE 1.06* 0.82  CALCIUM 8.3* 8.5*     Intake/Output Summary (Last 24 hours) at 05/14/2023 0950 Last data filed at 05/13/2023 1818 Gross per 24 hour  Intake 474 ml  Output --  Net 474 ml        Physical Exam: Vital Signs Blood pressure 128/60, pulse (!) 50, temperature 98.2 F (36.8 C), resp. rate 16, height 5\' 4"  (1.626 m), weight 102.7 kg, SpO2 99%.      General: awake, alert, appropriate, sitting up in bed; NAD HENT: conjugate gaze; oropharynx moist CV: regular rhythm, bradycardic rate; no JVD Pulmonary: CTA B/L; no W/R/R- good air movement GI: soft, NT, ND, (+)BS- more normoactive Psychiatric: appropriate Neurological: Ox3 Extremities; in Boot on RLE/ Ankle- steristrips on medial ankle  Musculoskeletal:        General: Swelling present. Normal range of motion.     Cervical back: Normal range of motion.     Comments: Right leg in splint  Skin: No breakdown noted in the visible portion Neurological:     Mental Status: She is alert.     Comments: Alert and oriented x 3. Normal insight and awareness. Intact Memory. Normal language and speech. Cranial nerve exam unremarkable. MMT: UE 4/5 prox to 4+/5 distal. RLE- 2/5 (pain), able to wiggle toes. LLE 3/5 HF, KE and 4+/5 ADF/PF.  Sensory exam normal for light touch and pain in all 4 limbs. No limb ataxia or cerebellar signs. No abnormal tone appreciated.  Stable 1/29 Psychiatric:        Mood and Affect: Mood  normal.        Behavior: Behavior normal.      Assessment/Plan: 1. Functional deficits which require 3+ hours per day of interdisciplinary therapy in a comprehensive inpatient rehab setting. Physiatrist is providing close team supervision and 24 hour management of active medical problems listed below. Physiatrist and rehab team continue to assess barriers to discharge/monitor patient progress toward functional and medical goals  Care Tool:  Bathing    Body parts bathed by patient: Right arm, Left arm, Chest, Abdomen, Front perineal area, Buttocks, Right upper leg, Left upper leg, Right lower leg, Left lower leg, Face   Body parts bathed by helper: Left lower leg, Right lower leg     Bathing assist Assist Level: Supervision/Verbal cueing     Upper Body Dressing/Undressing Upper body dressing   What is the patient wearing?: Pull over shirt    Upper body assist Assist Level: Set up assist    Lower Body Dressing/Undressing Lower body dressing      What is the patient wearing?: Underwear/pull up, Pants  Lower body assist Assist for lower body dressing: Minimal Assistance - Patient > 75%     Toileting Toileting    Toileting assist Assist for toileting: Minimal Assistance - Patient > 75%     Transfers Chair/bed transfer  Transfers assist     Chair/bed transfer assist level: Minimal Assistance - Patient > 75%     Locomotion Ambulation   Ambulation assist      Assist level: Minimal Assistance - Patient > 75% Assistive device: Walker-rolling Max distance: 15   Walk 10 feet activity   Assist  Walk 10 feet activity did not occur: Safety/medical concerns (Per PT Eval)  Assist level: Minimal Assistance - Patient > 75% Assistive device: Walker-rolling   Walk 50 feet activity   Assist Walk 50 feet with 2 turns activity did not occur: Safety/medical concerns         Walk 150 feet activity   Assist Walk 150 feet activity did not occur:  Safety/medical concerns         Walk 10 feet on uneven surface  activity   Assist Walk 10 feet on uneven surfaces activity did not occur: Safety/medical concerns         Wheelchair     Assist Is the patient using a wheelchair?: Yes Type of Wheelchair: Manual    Wheelchair assist level: Supervision/Verbal cueing Max wheelchair distance: 100'    Wheelchair 50 feet with 2 turns activity    Assist        Assist Level: Supervision/Verbal cueing   Wheelchair 150 feet activity     Assist      Assist Level: Supervision/Verbal cueing   Blood pressure 128/60, pulse (!) 50, temperature 98.2 F (36.8 C), resp. rate 16, height 5\' 4"  (1.626 m), weight 102.7 kg, SpO2 99%.  Medical Problem List and Plan: 1. Functional deficits secondary to right trimalleolar ankle fx after syncopal episode.              -patient may shower if RLE is covered             -ELOS/Goals: 8-13 days,  mod I to supervision goals with PT and OT  -est DC 2/1  Continue CIR PT and OT   2.  Antithrombotics: SCDs ordered. Bilateral lower extremities (chemo prophy d/c due to epistaxis, anemia)             -antiplatelet therapy: none   3. Pain Management:  -continue Tylenol 1000 mg q 8 hours             -continue oxycodone 10 mg q 4 hours prn  -1/23-1/25 pain controlled, continue current    1/26- pain controlled with meds 4. Chronic depression/Insomnia: LCSW to evaluate and provide emotional support             -continue Celexa 20 mg daily             -trazodone 50 mg q HS prn sleep             -pt was on at bedtime remeron and xanax prn at home (not resumed)             -antipsychotic agents: n/a   5. Neuropsych/cognition: This patient is capable of making decisions on her own behalf.   6. Skin/Wound Care: Routine skin care checks   7. Fluids/Electrolytes/Nutrition: Routine Is and Os and follow-up chemistries   8: Hypertension: monitor TID and prn             -continue  amlodipine 10  mg daily             -continue lisinopril 20 mg daily  -1/21 fair control continue to monitor trend for now  1/25- BP ocntrolled- Cards increased hydralazine to 10 mg TID when stopped Norvasc  1/26- Pt's BP up to 150s-170s since Norvasc stopped- if BP doesn't come down in next 24 hours, suggest increase of Hydralazine to 25 mg TID- will order prn' hydralazine 25 mg q8 hours prn as well if gets >180/100    05/14/2023    5:37 AM 05/13/2023   10:40 PM 05/13/2023    8:21 PM  Vitals with BMI  Systolic 128 168 161  Diastolic 60 77 70  Pulse 50 62 58      9: Hyperlipidemia: continue statin   10: GERD: continue PPI   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 Pt is transfused almost weekly as an outpt at [facility]             -follow-up CBC             -goal to keep hgb >8  -h/h stable   1/25- Hb 7.7- is overall a little less, but was running 8.1 or so- will monitor 14: Intermittent epistaxis due to #11: - Afrin, other measures as needed             -consider ENT consultation for hemostasis              15: Right ankle fracture s/p ORIF 1/19, Dr. Hulda Humphrey (has nylon sutures and short leg splint with posterior slab and stirrups)             -NWB for 8-10 weeks             -follow-up ortho in 2-3 weeks post-op   1/26- got 1/2 sutures out- asked Charge nurse to see if can get rest of sutures out/find them? 16: Tobacco use: cessation counseling   17: Code status: DNR-limited    19: DM-2: A1c = 5.0% October 2024 (glucoses ~100-120s on regular diet)             -home metformin 500 mg BID not restarted             -d/c CBGs  D/c Boost  -Glucose stable on BMP today 1/20   20:  Thrombocytosis: resolved   21: New onset nausea:          resolved  22. Diastolic hypotension: resolved  23. Obesity: provide dietary education  24. Constipation: BM 1/28, continue senna 1 tab HS   25. HTN: continue lisinopril   26. Hypotension: resolved, d/c hydralazine  27. AKI  Cr reviewed and resolved  D/c hydralazine  28. Bradycardia: d/c hydralazine  29. Iron deficiency anemia: Venofer ordered  20. Anxiety: prn ativan ordered  LOS: 12 days A FACE TO FACE EVALUATION WAS PERFORMED  Peggy House Peggy House 05/14/2023, 9:50 AM

## 2023-05-14 NOTE — Progress Notes (Signed)
Occupational Therapy Session Note  Patient Details  Name: Peggy House MRN: [id number] Date of Birth: 04-May-1960  Today's Date: 05/14/2023 OT Individual Time: 1453-1540 OT Individual Time Calculation (min): 47 min    Short Term Goals: Week 2:  OT Short Term Goal 1 (Week 2): Pt will maintain NWB on RLE with supervision 100% of time OT Short Term Goal 2 (Week 2): Pt will complete sit > stand using LRAD with CGA OT Short Term Goal 3 (Week 2): Pt will donn LB clothing with CGA using AE PRN OT Short Term Goal 4 (Week 2): Pt will donn RLE CAM boot with mod A  Skilled Therapeutic Interventions/Progress Updates:    Patient agreeable to participate in OT session. Reports 0/10 pain level.   Patient participated in skilled OT session focusing on ADL re-training, functional transfers, and BUE strengthening while following HEP.  Bed mobility completed at SBA level while transitioning from supine to sitting on EOB; HOB elevated.  While seated EOB, OT educated pt on donning technique for CAM boot. Pt participated as able.  Pt completed stand pvt transfer with RW needing Mod A. Increased difficulty with power up into standing. Pt was able to maintain NWB status on RLE during transfer.   Pt self propelled WC from room to Day room and back to room (at end of session) with SBA focusing on BUE strength. Pt required increased verbal and visual cues for managing WC during turns.   UB strengthening exercises: - seated, shoulder horizontal abduction, adduction, IR/er, PNF pattern, elbow flexion/extension, BUE, 10X, 1 set, red band.  Therapy Documentation Precautions:  Precautions Precautions: Fall Precaution Comments: RLE NWB Required Braces or Orthoses: Other Brace Other Brace: RLE CAM boot Restrictions Weight Bearing Restrictions Per Provider Order: Yes RLE Weight Bearing Per Provider Order: Non weight bearing    Therapy/Group: Individual Therapy  Limmie Patricia, OTR/L,CBIS   Supplemental OT - [facility] and [facility] Secure Chat Preferred   05/14/2023, 4:30 PM

## 2023-05-14 NOTE — Progress Notes (Signed)
Occupational Therapy Session Note  Patient Details  Name: Peggy House MRN: [id number] Date of Birth: 1960-08-16  Today's Date: 05/14/2023 OT Individual Time: 4098-1191 OT Individual Time Calculation (min): 53 min    Short Term Goals: Week 2:  OT Short Term Goal 1 (Week 2): Pt will maintain NWB on RLE with supervision 100% of time OT Short Term Goal 2 (Week 2): Pt will complete sit > stand using LRAD with CGA OT Short Term Goal 3 (Week 2): Pt will donn LB clothing with CGA using AE PRN OT Short Term Goal 4 (Week 2): Pt will donn RLE CAM boot with mod A  Skilled Therapeutic Interventions/Progress Updates:  Skilled OT intervention completed with focus on sit > stands, dynamic balance with unilateral UE support, standing tolerance, IADL management. Pt received seated in w/c, agreeable to session. No pain reported.  Pt declined self-care needs. Transported dependently in w/c <> gym. Pt completed multiple sit > stands with min A using RW with RLE CAM boot already donned, during table top activity to address standing balance and tolerance needed for BADLs. In stance with close supervision using RW, pt was able to sort meal prep cards according to grocery list, then assemble items according to grocery categories. Pt required intermittent min A about 10% of time for accuracy. Pt required multiple extended seated rest breaks for LLE fatigue/pain. Discussed potential steroid injection to Lt knee as pt has received prior and reports this is only thing that helped knee pain PTA besides oral meds.  Back in room,  seated in w/c, pt completed the following BUE exercises to promote BUE strength/endurance needed for independence with functional transfers and BADLs: (With red theraband) 12 reps Horizontal abduction Self-anchored shoulder flexion each arm Self-anchored bicep flexion each arm  Pt remained seated in w/c, with chair alarm on/activated, and with all needs in reach at end of session.     Therapy Documentation Precautions:  Precautions Precautions: Fall Precaution Comments: RLE NWB Required Braces or Orthoses: Other Brace Other Brace: RLE CAM boot Restrictions Weight Bearing Restrictions Per Provider Order: Yes RLE Weight Bearing Per Provider Order: Non weight bearing    Therapy/Group: Individual Therapy  Meagan Spease E Matina Rodier, MS, OTR/L  05/14/2023, 11:01 AM

## 2023-05-14 NOTE — Progress Notes (Signed)
Patient ID: Peggy House, female   DOB: 1961/04/03, 63 y.o.   MRN: [id number]  1305- SW left message for pt son Clide Cliff requesting return phone call to discuss scheduling family edu, and informing on new d/c date 2/4.   1307-SW left message for pt son [place] informing on above, and waiting on follow-up.   1308- Number listed for pt son Ivin Booty listed does not work.   SW sent text message to sons and hopefully someone will respond.   *SW met with pt briefly to review updates from team conference, inform on new d/c date now 2/4, [facility] and DME recs ordered-w/c and TTB ordered already. She will make efforts to make contact with there sister about family edu. She is aware SW unable to make contact with her sons.   Cecile Sheerer, MSW, LCSW Office: [phone] Cell: [phone] Fax: [phone]

## 2023-05-14 NOTE — Plan of Care (Signed)
  Problem: RH Balance Goal: LTG Patient will maintain dynamic standing with ADLs (OT) Description: LTG:  Patient will maintain dynamic standing balance with assist during activities of daily living (OT)  Flowsheets (Taken 05/14/2023 1409) LTG: Pt will maintain dynamic standing balance during ADLs with: (downgraded due to balance, cognitive and coordination deficits) Contact Guard/Touching assist   Problem: Sit to Stand Goal: LTG:  Patient will perform sit to stand in prep for activites of daily living with assistance level (OT) Description: LTG:  Patient will perform sit to stand in prep for activites of daily living with assistance level (OT) Flowsheets (Taken 05/14/2023 1409) LTG: PT will perform sit to stand in prep for activites of daily living with assistance level: (downgraded due to balance, cognitive and coordination deficits) Contact Guard/Touching assist   Problem: RH Bathing Goal: LTG Patient will bathe all body parts with assist levels (OT) Description: LTG: Patient will bathe all body parts with assist levels (OT) Flowsheets (Taken 05/14/2023 1409) LTG: Pt will perform bathing with assistance level/cueing: (downgraded due to balance, cognitive and coordination deficits) Contact Guard/Touching assist   Problem: RH Dressing Goal: LTG Patient will perform lower body dressing w/assist (OT) Description: LTG: Patient will perform lower body dressing with assist, with/without cues in positioning using equipment (OT) Flowsheets (Taken 05/14/2023 1409) LTG: Pt will perform lower body dressing with assistance level of: (downgraded due to balance, cognitive and coordination deficits) Contact Guard/Touching assist   Problem: RH Toileting Goal: LTG Patient will perform toileting task (3/3 steps) with assistance level (OT) Description: LTG: Patient will perform toileting task (3/3 steps) with assistance level (OT)  Flowsheets (Taken 05/14/2023 1409) LTG: Pt will perform toileting task (3/3  steps) with assistance level: (downgraded due to balance, cognitive and coordination deficits) Contact Guard/Touching assist   Problem: RH Toilet Transfers Goal: LTG Patient will perform toilet transfers w/assist (OT) Description: LTG: Patient will perform toilet transfers with assist, with/without cues using equipment (OT) Flowsheets (Taken 05/14/2023 1409) LTG: Pt will perform toilet transfers with assistance level of: (downgraded due to balance, cognitive and coordination deficits) Contact Guard/Touching assist   Problem: RH Tub/Shower Transfers Goal: LTG Patient will perform tub/shower transfers w/assist (OT) Description: LTG: Patient will perform tub/shower transfers with assist, with/without cues using equipment (OT) Flowsheets (Taken 05/14/2023 1409) LTG: Pt will perform tub/shower stall transfers with assistance level of: (downgraded due to balance, cognitive and coordination deficits) Contact Guard/Touching assist

## 2023-05-14 NOTE — Progress Notes (Signed)
Physical Therapy Session Note  Patient Details  Name: Peggy House MRN: [id number] Date of Birth: 04-Sep-1960  Today's Date: 05/14/2023 PT Individual Time: 1640-1735 PT Individual Time Calculation (min): 55 min   Short Term Goals: Week 2:  PT Short Term Goal 1 (Week 2): STG=LTG due to LOS  Skilled Therapeutic Interventions/Progress Updates:    Pt received sitting in w/c and agreeable to therapy session. Pt already wearing R LE CAM boot.   Called pt's sister, Peggy House, from pt's personal phone to discuss planning family education prior to D/C and Peggy House reports she could likely come this Saturday 2/1; however, she will need to talk to her brother to see when he is available to bring her. Therapist educated usual education times are from 9AM-11AM or 1PM-3PM. Sister supposed to call back by noon tomorrow to notify staff of when she is available and planning to attend. Pt's brother, Peggy House, will be the one to bring Peggy House on Saturday.  Pt's personal wheelchair was delivered to her room.   Requires cuing to ensure w/c brakes locked prior to standing - pt remembers this throughout remainder of session so anticipate was due to being out of her routine. Sit>stand w/c>RW with therapist guarding to prevent R LE WBing - pt pushes up with L hand from armrest and R hand on RW - cuing for L trunk lean to promote full WBing through L LE - min A for lifting to stand. Pt with tendency to want to put R foot on ground this evening due to B UE fatigue. R stand pivot to her personal w/c using RW with CGA/light min A for steadying and repeated cuing to ensure R LE NWBing during transfer.   Therapist adjusted pt's personal w/c leg rests for improved fit and educated pt on how to manage her personal w/c leg rests including elevating and lowering leg rests and donning/doffing them (has trouble donning them). Determined the increased floor to seat height of pt's personal w/c will help improve sit<>stand transfers. Educated  on extended brake handles.   B UE w/c propulsion ~6ft with supervision - pt demos increased propulsion speed in her personal w/c compared to the CIR w/c - pt reports increased ease of propulsion, but still becomes fatigued at the end.   Had pt set-up her personal w/c in prep for stand pivot to CIR w/c (pt wanting to leave clear cover on her personal w/c cushion at this time). Pt with difficulty removing w/c legrests (increased time to find the latch) - therapist placed pink coban on them to visually and tactile find them more independently.  Sit>stand personal w/c>RW with lighter min A from this higher seat height - L stand pivot to w/c using RW with CGA and pt improving in R foot NWBing throughout.   Pt left seated in w/c with needs in reach, seat belt alarm on, and meal tray set-up.    Therapy Documentation Precautions:  Precautions Precautions: Fall Precaution Comments: RLE NWB Required Braces or Orthoses: Other Brace Other Brace: RLE CAM boot Restrictions Weight Bearing Restrictions Per Provider Order: Yes RLE Weight Bearing Per Provider Order: Non weight bearing   Pain:  No reports of pain throughout session.   Therapy/Group: Individual Therapy  Ginny Forth , PT, DPT, NCS, CSRS 05/14/2023, 4:47 PM

## 2023-05-14 NOTE — Progress Notes (Signed)
Physical Therapy Session Note  Patient Details  Name: Peggy House MRN: [id number] Date of Birth: [birth year]  Today's Date: 05/14/2023 PT Individual Time: 2952-8413 PT Individual Time Calculation (min): 40 min   Short Term Goals: Week 1:  PT Short Term Goal 1 (Week 1): Pt will complete bed mobility with CGA PT Short Term Goal 1 - Progress (Week 1): Met PT Short Term Goal 2 (Week 1): Pt will complete bed<>chair transfers with minA and LRAD PT Short Term Goal 2 - Progress (Week 1): Met PT Short Term Goal 3 (Week 1): Pt will hop 3ft with minA and LRAD PT Short Term Goal 3 - Progress (Week 1): Met Week 2:  PT Short Term Goal 1 (Week 2): STG=LTG due to LOS  Skilled Therapeutic Interventions/Progress Updates:   Received pt semi-reclined in bed, pt agreeable to PT treatment, and reported pain 4/10 in R ankle (premedicated). Session with emphasis on functional mobility/transfers, generalized strengthening and endurance, dressing, dynamic standing balance/coordination, and simulated car transfers. Pt transferred semi-reclined<>sitting R EOB with HOB elevated and use of bedrails with supervision. Doffed dirty scrub top and donned clean one with supervision. Donned non-skid socks, scrub pants, and R CAM boot sitting EOB with max A for time management purposes. Washed face sitting EOB with set up assist, then stood with RW and mod A, and required max A to pull pants over hips. Transferred into WC via stand<>pivot with RW and min A and transported to/from room in [facility] dependently for time management and energy conservation purposes. Pt performed simulated car transfer with RW and min A (height of pt's son's jeep). Pt able to get BLEs in/out with no physical assist but required increased time. Returned to room and concluded session with pt sitting in [facility] with all needs within reach set up to eat breakfast. RN notified of pt's request for nausea medication.    Therapy Documentation Precautions:   Precautions Precautions: Fall Precaution Comments: RLE NWB Required Braces or Orthoses: Other Brace Other Brace: RLE CAM boot Restrictions Weight Bearing Restrictions Per Provider Order: Yes RLE Weight Bearing Per Provider Order: Non weight bearing  Therapy/Group: Individual Therapy Marlana Salvage Zaunegger Blima Rich PT, DPT 05/14/2023, 6:52 AM

## 2023-05-15 DIAGNOSIS — S82851D Displaced trimalleolar fracture of right lower leg, subsequent encounter for closed fracture with routine healing: Secondary | ICD-10-CM | POA: Diagnosis not present

## 2023-05-15 MED ORDER — LORAZEPAM 0.5 MG PO TABS: 0.5000 mg | ORAL_TABLET | Freq: Three times a day (TID) | ORAL | Status: DC | PRN

## 2023-05-15 MED ORDER — LISINOPRIL 5 MG PO TABS
5.0000 mg | ORAL_TABLET | Freq: Every day | ORAL | Status: DC
  Administered 2023-05-16: 5 mg via ORAL
  Filled 2023-05-15: qty 1

## 2023-05-15 NOTE — Plan of Care (Signed)
Vitals stable and pain was managed with oxycodone and tylenol Problem: Consults Goal: RH GENERAL PATIENT EDUCATION Description: See Patient Education module for education specifics. Outcome: Progressing   Problem: RH BOWEL ELIMINATION Goal: RH STG MANAGE BOWEL WITH ASSISTANCE Description: STG Manage Bowel with toileting Assistance. Outcome: Progressing Goal: RH STG MANAGE BOWEL W/MEDICATION W/ASSISTANCE Description: STG Manage Bowel with Medication with mod I Assistance. Outcome: Progressing   Problem: RH BLADDER ELIMINATION Goal: RH STG MANAGE BLADDER WITH ASSISTANCE Description: STG Manage Bladder With toileting Assistance Outcome: Progressing   Problem: RH SKIN INTEGRITY Goal: RH STG SKIN FREE OF INFECTION/BREAKDOWN Description: Manage skin w min assist Outcome: Progressing Goal: RH STG MAINTAIN SKIN INTEGRITY WITH ASSISTANCE Description: STG Maintain Skin Integrity With min Assistance. Outcome: Progressing Goal: RH STG ABLE TO PERFORM INCISION/WOUND CARE W/ASSISTANCE Description: STG Able To Perform Incision/Wound Care With min Assistance. Outcome: Progressing   Problem: RH SAFETY Goal: RH STG ADHERE TO SAFETY PRECAUTIONS W/ASSISTANCE/DEVICE Description: STG Adhere to Safety Precautions With cues  Assistance/Device. Outcome: Progressing   Problem: RH PAIN MANAGEMENT Goal: RH STG PAIN MANAGED AT OR BELOW PT'S PAIN GOAL Description: < 4 with prns Outcome: Progressing   Problem: RH KNOWLEDGE DEFICIT GENERAL Goal: RH STG INCREASE KNOWLEDGE OF SELF CARE AFTER HOSPITALIZATION Description: Patient and family will be able to manage care at discharge using educational resources for medication, skin care and dietary modification independently Outcome: Progressing

## 2023-05-15 NOTE — Progress Notes (Signed)
Physical Therapy Session Note  Patient Details  Name: Peggy House MRN: [id number] Date of Birth: Sep 11, 1960  Today's Date: 05/15/2023 PT Individual Time: [id number] and 3295-1884 PT Individual Time Calculation (min): 56 min and 54 min  Short Term Goals: Week 1:  PT Short Term Goal 1 (Week 1): Pt will complete bed mobility with CGA PT Short Term Goal 1 - Progress (Week 1): Met PT Short Term Goal 2 (Week 1): Pt will complete bed<>chair transfers with minA and LRAD PT Short Term Goal 2 - Progress (Week 1): Met PT Short Term Goal 3 (Week 1): Pt will hop 49ft with minA and LRAD PT Short Term Goal 3 - Progress (Week 1): Met Week 2:  PT Short Term Goal 1 (Week 2): STG=LTG due to LOS  Skilled Therapeutic Interventions/Progress Updates:   Treatment Session 1  Received clearance from MD that pt is allowed to remove RLE CAM boot with mobility as long as she maintains NWB on her RLE.   Received pt semi-reclined in bed with MD present for morning rounds. Pt agreeable to PT treatment and reported pain 6/10 in R ankle and L knee (premedicated). Session with emphasis on functional mobility/transfers, dressing, generalized strengthening and endurance, and dynamic standing balance/coordination. Donned knee high teds and non-skid socks with max A for edema management and pt transferred semi-reclined<>sitting EOB with HOB elevated and use of bedrails with supervision. Donned pants with max A for time management purposes and stood from elevated EOB with RW and min A and required max A to pull pants over hips. Pt performed stand<>pivot into WC with RW and CGA. Of note, pt demo much better adherence to RLE NWB precautions with CAM boot off.  Sat in WC at sink and washed face, applied lotions, brushed teeth, groomed, and shaved with set up assist and significantly increased time. Pt shaved eyebrows and increased time spent drawing eyebrows on with emphasis on fine motor control - provided min A. Pt reports  plan for her sister to come on Saturday for family education but needs to call her brother today to confirm that he can drive her sister here. Concluded session with pt sitting in [facility] with all needs within reach.   Treatment Session 2 Received pt sitting in WC, pt agreeable to PT treatment, and reported pain 6/10 in R ankle (premedicated). Session with emphasis on functional mobility/transfers, generalized strengthening and endurance, dynamic standing balance/coordination, and toileting. Removed soiled shirt and donned clean one with supervision. Pt then performed WC mobility 163ft using BUE and supervision to dayroom with emphasis on BUE strength. Performed stand<>pivot to/from table with RW and min A (good adherence to RLE NWB precautions without CAM boot). Stood from [facility] with RW and min A x 2 trials and performed R hip flexion 2x10 and R hip abduction 2x10 with emphasis on LE strength/ROM. Pt politely declined any ambulation due to L knee pain and required frequent rest breaks throughout session. Worked on blocked practice sit<>stands 2x5 reps with min A fading to CGA using RW with emphasis on technique and eccentric control when sitting. Transported back to room in [facility] dependently and encouraged toileting prior to meeting. Pt transferred onto bedside commode with RW and min A and required max A for clothing management. Removed soiled brief (pt incontinent of urine) and pt continued to void - handed off care to NT due to time restrictions.  Therapy Documentation Precautions:  Precautions Precautions: Fall Precaution Comments: RLE NWB Required Braces or Orthoses: Other Brace Other Brace:  RLE CAM boot Restrictions Weight Bearing Restrictions Per Provider Order: Yes RLE Weight Bearing Per Provider Order: Non weight bearing  Therapy/Group: Individual Therapy Marlana Salvage Zaunegger Blima Rich PT, DPT 05/15/2023, 6:51 AM

## 2023-05-15 NOTE — Progress Notes (Signed)
PROGRESS NOTE   Subjective/Complaints: C/o left knee pain: heating pad has been applied and pain medications were administered a few minutes ago Discussed that she does not need CAM boot  ROS:  Right ankle pain-controlled, +fatigue, +left knee pain   Objective:   No results found. No results for input(s): "WBC", "HGB", "HCT", "PLT" in the last 72 hours.   Recent Labs    05/13/23 0322  NA 141  K 3.8  CL 111  CO2 23  GLUCOSE 105*  BUN 9  CREATININE 0.82  CALCIUM 8.5*     Intake/Output Summary (Last 24 hours) at 05/15/2023 0925 Last data filed at 05/15/2023 0900 Gross per 24 hour  Intake 357 ml  Output 150 ml  Net 207 ml        Physical Exam: Vital Signs Blood pressure (!) 128/52, pulse (!) 50, temperature 98.6 F (37 C), resp. rate 18, height 5\' 4"  (1.626 m), weight 102.7 kg, SpO2 96%.      General: awake, alert, appropriate, sitting up in bed; NAD HENT: conjugate gaze; oropharynx moist CV: Bradycardic Pulmonary: CTA B/L; no W/R/R- good air movement GI: soft, NT, ND, (+)BS- more normoactive Psychiatric: appropriate Neurological: Ox3 Extremities; in Boot on RLE/ Ankle- steristrips on medial ankle  Musculoskeletal:        General: Swelling present. Normal range of motion.     Cervical back: Normal range of motion.     Comments: Right leg in splint  Skin: No breakdown noted in the visible portion Neurological:     Mental Status: She is alert.     Comments: Alert and oriented x 3. Normal insight and awareness. Intact Memory. Normal language and speech. Cranial nerve exam unremarkable. MMT: UE 4/5 prox to 4+/5 distal. RLE- 2/5 (pain), able to wiggle toes. LLE 3/5 HF, KE and 4+/5 ADF/PF.  Sensory exam normal for light touch and pain in all 4 limbs. No limb ataxia or cerebellar signs. No abnormal tone appreciated.  Stable 1/29 Psychiatric:        Mood and Affect: Mood normal.        Behavior: Behavior  normal.      Assessment/Plan: 1. Functional deficits which require 3+ hours per day of interdisciplinary therapy in a comprehensive inpatient rehab setting. Physiatrist is providing close team supervision and 24 hour management of active medical problems listed below. Physiatrist and rehab team continue to assess barriers to discharge/monitor patient progress toward functional and medical goals  Care Tool:  Bathing    Body parts bathed by patient: Right arm, Left arm, Chest, Abdomen, Front perineal area, Buttocks, Right upper leg, Left upper leg, Right lower leg, Left lower leg, Face   Body parts bathed by helper: Left lower leg, Right lower leg     Bathing assist Assist Level: Supervision/Verbal cueing     Upper Body Dressing/Undressing Upper body dressing   What is the patient wearing?: Pull over shirt    Upper body assist Assist Level: Set up assist    Lower Body Dressing/Undressing Lower body dressing      What is the patient wearing?: Underwear/pull up, Pants     Lower body assist Assist for lower body dressing:  Minimal Assistance - Patient > 75%     Editor, commissioning assist Assist for toileting: Minimal Assistance - Patient > 75%     Transfers Chair/bed transfer  Transfers assist     Chair/bed transfer assist level: Minimal Assistance - Patient > 75%     Locomotion Ambulation   Ambulation assist      Assist level: Minimal Assistance - Patient > 75% Assistive device: Walker-rolling Max distance: 15   Walk 10 feet activity   Assist  Walk 10 feet activity did not occur: Safety/medical concerns (Per PT Eval)  Assist level: Minimal Assistance - Patient > 75% Assistive device: Walker-rolling   Walk 50 feet activity   Assist Walk 50 feet with 2 turns activity did not occur: Safety/medical concerns         Walk 150 feet activity   Assist Walk 150 feet activity did not occur: Safety/medical concerns         Walk 10  feet on uneven surface  activity   Assist Walk 10 feet on uneven surfaces activity did not occur: Safety/medical concerns         Wheelchair     Assist Is the patient using a wheelchair?: Yes Type of Wheelchair: Manual    Wheelchair assist level: Supervision/Verbal cueing Max wheelchair distance: 100'    Wheelchair 50 feet with 2 turns activity    Assist        Assist Level: Supervision/Verbal cueing   Wheelchair 150 feet activity     Assist      Assist Level: Supervision/Verbal cueing   Blood pressure (!) 128/52, pulse (!) 50, temperature 98.6 F (37 C), resp. rate 18, height 5\' 4"  (1.626 m), weight 102.7 kg, SpO2 96%.  Medical Problem List and Plan: 1. Functional deficits secondary to right trimalleolar ankle fx after syncopal episode.              -patient may shower if RLE is covered             -ELOS/Goals: 8-13 days,  mod I to supervision goals with PT and OT  -est DC 2/1  Continue CIR PT and OT   2.  Antithrombotics: SCDs ordered. Bilateral lower extremities (chemo prophy d/c due to epistaxis, anemia)             -antiplatelet therapy: none   3. Pain Management:  -continue Tylenol 1000 mg q 8 hours             -continue oxycodone 10 mg q 4 hours prn  -1/23-1/25 pain controlled, continue current    1/26- pain controlled with meds 4. Chronic depression/Insomnia: LCSW to evaluate and provide emotional support             -continue Celexa 20 mg daily             -trazodone 50 mg q HS prn sleep             -pt was on at bedtime remeron and xanax prn at home (not resumed)             -antipsychotic agents: n/a   5. Neuropsych/cognition: This patient is capable of making decisions on her own behalf.   6. Skin/Wound Care: Routine skin care checks   7. Fluids/Electrolytes/Nutrition: Routine Is and Os and follow-up chemistries   8: Hypertension: monitor TID and prn             -continue amlodipine 10 mg daily             -  continue lisinopril  20 mg daily  -1/21 fair control continue to monitor trend for now  1/25- BP ocntrolled- Cards increased hydralazine to 10 mg TID when stopped Norvasc  Currently hypotensive: decrease lisinopril to 5mg  daily    05/15/2023    3:09 AM 05/14/2023    8:13 PM 05/14/2023    1:31 PM  Vitals with BMI  Systolic 128 142 161  Diastolic 52 63 53  Pulse 50 55 54      9: Hyperlipidemia: continue statin   10: GERD: continue PPI   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 Pt [facility]             -follow-up CBC             -goal to keep hgb >8  -h/h stable   1/25- Hb 7.7- is overall a little less, but was running 8.1 or so- will monitor 14: Intermittent epistaxis due to #11: - Afrin, other measures as needed             -consider ENT consultation for hemostasis              15: Right ankle fracture s/p ORIF 1/19, Dr. Hulda Humphrey (has nylon sutures and short leg splint with posterior slab and stirrups)             -NWB for 8-10 weeks             -follow-up ortho in 2-3 weeks post-op   1/26- got 1/2 sutures out- asked Charge nurse to see if can get rest of sutures out/find them? 16: Tobacco use: cessation counseling   17: Code status: DNR-limited    19: DM-2: A1c = 5.0% October 2024 (glucoses ~100-120s on regular diet)             -home metformin 500 mg BID not restarted             -d/c CBGs  D/c Boost  -Glucose stable on BMP today 1/20   20: Thrombocytosis: resolved   21: New onset nausea:          resolved  22. Diastolic hypotension: resolved  23. Obesity: provide dietary education  24. Constipation: BM 1/28, continue senna 1 tab HS   25.  HTN: continue lisinopril   26. Hypotension: resolved, d/c hydralazine  27. AKI  Cr reviewed and resolved  D/c hydralazine  28. Bradycardia: d/c hydralazine  29. Iron deficiency anemia: Venofer ordered, discussed that this is a weekly injection  20. Anxiety: prn ativan ordered, decrease to q8H prn  21. Left knee pain: continue prn hot packs  LOS: 13 days A FACE TO FACE EVALUATION WAS PERFORMED  Farrie Sann P Nacole Fluhr 05/15/2023, 9:25 AM

## 2023-05-15 NOTE — Patient Care Conference (Signed)
Inpatient RehabilitationTeam Conference and Plan of Care Update Date: 05/14/2023   Time: 1153 am    Patient Name: Peggy House      Medical Record Number: [id number]  Date of Birth: 04/21/1960 Sex: Female         Room/Bed: 4W04C/4W04C-01 Payor Info: Payor: [place] [employer]ICA[employer] PREPAID HE[employer] [place]AN / Plan: [place] MEDICAID HEALTHY BLUE / Product Type: *No Product type* /    Admit Date/Time:  05/02/2023  3:30 PM  Primary Diagnosis:  Closed right trimalleolar fracture  Hospital Problems: Principal Problem:   Closed right trimalleolar fracture    Expected Discharge Date: Expected Discharge Date: 05/20/23  Team Members Present: Physician leading conference: Dr. Sula Soda Social Worker Present: Cecile Sheerer, LCSWA Nurse Present: Chana Bode, Janina Mayo, RN PT Present: Blima Rich, PT OT Present: Candee Furbish, OT SLP Present: Jeannie Done, SLP     Current Status/Progress Goal Weekly Team Focus  Bowel/Bladder   Paitent is continent/incontient   Patient to be contient at all times.   Patient seems to be incotnient at night.  toileting to be offered Q3-4 during sleep.    Swallow/Nutrition/ Hydration               ADL's   Set up A UB, Min A LB, Min A toileting   Goals downgraded to supervision due to cog/lack of carryover/fall risk   Barriers- new difficulty maintaining NWB on RLE due to LLE weakness, dynamic balance with unilateral UE support, functional endurance    Mobility   bed mobility supervision, sit<>stands with RW min A, stand<>pivot with RW CGA/min A, gait ~62ft with RW CGA/min A, WC mobility 183ft supervision   CGA, mod I WC mobility  barriers: pain, weakness/deconditioning, difficulty adhering to RLE NWB precautions due to weight from CAM boot - family ed with sister would be helpful    Communication                Safety/Cognition/ Behavioral Observations  sup to min A for recall and problem solving   Sup A   implement memory strategies  and medication management    Pain   Patient uses 1-10 pain scale appropriately and seems to have the appropriate meds for her pain level.   To maintain well controlled pain.   Continue with current plan but add additional comfort measures such as heat, cold, positioning, etc....    Skin   Patient has intact skin.   Skin shall remain so.  Continue with good hygeine, nutrition and bed mobility.      Discharge Planning:  Pt will d/c to her son's home. PRN support from her son as he works a slot 2nd/3rd shift. Pt sister [name] to be with her during the day while he is not there.  No support from his girlfriend. SW unable to make contact with pt son at this time to schedule family edu. Pt sister does not drive and will need transportation for family edu. [facility] for HHPT/OT/SLP/aide. DMe- w/c and TTB ordered with [facility]. [facility] referral faxed to insurance. SW will confirm there are no barriers to discharge.   Team Discussion: Patient admitted post right ankle fracture after a syncopal episode. Patient currently has high anxiety and left knee pain ;medications were adjusted.. Patient treatments are limited by cognitive deficits: poor carry over.  Patient has difficulty adhering to non weight bearing restrictions on right lower extremity due to weight of cam boot.  Patient on target to meet rehab goals: Yes,  patient  currently needs set up assist with upper body care, min assist with lower body care and min assist with toileting.  Patient was able to ambulate 71' with contact guard assist using a RW. Patient was able to propel wheelchair at 150' with supervision. Wheelchair mobility goals are set for mod I and contact guard - min assist for mobility using a walker..  *See Care Plan and progress notes for long and short-term goals.   Revisions to Treatment Plan:  OT Downgraded goals for ADLs to supervision Steroid injection for knee Cam boot use clarification  Teaching Needs: Safety,  medications, transfers, toileting, skin care, weight bearing restrictions etc. NWB for 8-12 weeks.   Current Barriers to Discharge: Decreased caregiver support and Weight bearing restrictions  Possible Resolutions to Barriers: Family education [facility] + Home health follow up DME: TTB, WC [facility] referral     Medical Summary Current Status: ankle fracture, fatigue, anxiety  Barriers to Discharge: Medical stability;Morbid Obesity  Barriers to Discharge Comments: ankle fracture, fatigue, anxiety Possible Resolutions to [employer] Focus: will consult ortho regarind if CAM boot can be discontinued as she weight bears better without it, IV iron restarted, ativan started prn   Continued Need for Acute Rehabilitation Level of Care: The patient requires daily medical management by a physician with specialized training in physical medicine and rehabilitation for the following reasons: Direction of a multidisciplinary physical rehabilitation program to maximize functional independence : Yes Medical management of patient stability for increased activity during participation in an intensive rehabilitation regime.: Yes Analysis of laboratory values and/or radiology reports with any subsequent need for medication adjustment and/or medical intervention. : Yes   I attest that I was present, lead the team conference, and concur with the assessment and plan of the team.   Gwenyth Allegra 05/15/2023, 10:51 AM

## 2023-05-15 NOTE — Progress Notes (Signed)
Occupational Therapy Session Note  Patient Details  Name: Peggy House MRN: [id number] Date of Birth: [birth year]  Today's Date: 05/15/2023 OT Individual Time: 1420-1530 OT Individual Time Calculation (min): 70 min    Short Term Goals: Week 2:  OT Short Term Goal 1 (Week 2): Pt will maintain NWB on RLE with supervision 100% of time OT Short Term Goal 2 (Week 2): Pt will complete sit > stand using LRAD with CGA OT Short Term Goal 3 (Week 2): Pt will donn LB clothing with CGA using AE PRN OT Short Term Goal 4 (Week 2): Pt will donn RLE CAM boot with mod A  Skilled Therapeutic Interventions/Progress Updates:  Skilled OT intervention completed with focus on functional transfers, dynamic balance with unilateral UE support, cognition. Pt received seated in w/c, agreeable to session. Lt knee pain reported; pre-medicated. OT applied hot pack to anterior/posterior regions of knee and wrapped with ACE wrap during session as well as offered rest breaks and repositioning throughout for pain reduction.   Pt self propelled in w/c 50 ft x2, then transported rest of way to gym due to BUE fatigue. Min A sit > stand with RW and min A stand pivot > EOM with RW with cues for avoiding WB on RLE but improved ability to NWB without CAM boot on per ortho clearance.   Pt participated in the following dynamic standing balance and endurance tasks to promote independence and safety during BADLs and functional mobility: -placing and retrieving clips on basketball net. Min A sit > stand with RW x3 trials for each round, and CGA needed for balance when using LUE but Min A when using RUE; frequent seated rest breaks needed for fatigue  Pt participated in medication management activity using pill box- unable to report complete list of meds and frequency/time of day accurately without max A for recall. Pt then utilized 3 pill containers and sorted by varying instructions. Able to manage all caps, flaps and features  independently. Unable to sort independently as pt required max A for following directions especially for meds needed multiple times a day. Discussed safety measures for managing medication and encouraged pt to fill the pill box weekly with the guidance of a family member/caregiver due to frequency of mistakes to prevent accidental over/under dosing  Min A sit > stand and stand pivot with RW > w/c. Transported dependently in w/c > room. Pt remained seated, with bed alarm on/activated, and with all needs in reach at end of session.   Therapy Documentation Precautions:  Precautions Precautions: Fall Precaution Comments: RLE NWB Required Braces or Orthoses: Other Brace Other Brace: RLE CAM boot Restrictions Weight Bearing Restrictions Per Provider Order: Yes RLE Weight Bearing Per Provider Order: Non weight bearing    Therapy/Group: Individual Therapy  Melvyn Novas, MS, OTR/L  05/15/2023, 3:35 PM

## 2023-05-16 DIAGNOSIS — S82851D Displaced trimalleolar fracture of right lower leg, subsequent encounter for closed fracture with routine healing: Secondary | ICD-10-CM | POA: Diagnosis not present

## 2023-05-16 MED ORDER — DICLOFENAC SODIUM 1 % EX GEL
2.0000 g | Freq: Four times a day (QID) | CUTANEOUS | Status: DC
  Administered 2023-05-16 – 2023-05-19 (×8): 2 g via TOPICAL
  Filled 2023-05-16 (×2): qty 100

## 2023-05-16 MED ORDER — LISINOPRIL 10 MG PO TABS
10.0000 mg | ORAL_TABLET | Freq: Every day | ORAL | Status: DC
  Administered 2023-05-17 – 2023-05-19 (×3): 10 mg via ORAL
  Filled 2023-05-16 (×3): qty 1

## 2023-05-16 NOTE — Group Note (Signed)
Patient Details Name: Peggy House MRN: [id number] DOB: 1960-11-19 Today's Date: 05/16/2023  Time Calculation: OT Group Time Calculation OT Group Start Time: 1100 OT Group Stop Time: 1200 OT Group Time Calculation (min): 60 min      Group Description: Dance Group: Pt participated in dance group with an emphasis on social interaction, motor planning, increasing overall activity tolerance and bimanual tasks. All songs were selected by group members. Dance moves included AROM of BUE/BLE gross motor movements with an emphasis on building functional endurance.    Individual level documentation: Patient completed group from sitting level. Patientt needed supervision to complete various dance moves with OT providing visual model.  Patient needed min modifications during group.  Pain:  2/10 in L UE- rest break and repositioning provided  Precautions:  R LE NWB  Tollie Pizza Woodson 05/16/2023, 12:09 PM

## 2023-05-16 NOTE — Progress Notes (Signed)
Speech Language Pathology Discharge Summary  Patient Details  Name: Peggy House MRN: [id number] Date of Birth: April 17, 1960  Date of Discharge from SLP service:May 16, 2023  Today's Date: 05/16/2023 SLP Individual Time: 4098-1191 SLP Individual Time Calculation (min): 55 min   Skilled Therapeutic Interventions:  Pt seen for skilled SLP intervention to address cognitive goals. Pt completed visual safety awareness task with >90% accuracy independently. She benefited from occasional min cues to elaborate of solutions for various safety hazards. Pt completed visual short term recall task with varying accuracy from 80-100% with task ranging from simple to moderate level difficulty. Pt reported memory challenged at baseline with need to "write things down" to recall important information. Pt able to verbally sequence everyday tasks with 100% accuracy independently. All SLP goals met at this time. SLP discharge note below. Pt left sitting up in w/c with chair alarm activated and call bell in reach.      Patient has met 2 of 2 long term goals.  Patient to discharge at overall Supervision level.  Reasons goals not met:   all goals met  Clinical Impression/Discharge Summary:   Patient has demonstrated functional gains toward cognitive goals meeting 2 of 2 long-term goals this admission. Pt is currently completing functional cognitive tasks at the supervision level with min verbal cues to self correct errors. Patient reported baseline memory deficits and that she feels that she is at her cognitive baseline. Pt lives with her son and his family. Patient education is complete and patient to discharge at overall supervision level. Patient's family is independent to provide the necessary physical and cognitive-communication assistance at discharge. No further SLP services are recommended at this time.     Care Partner:  Caregiver Able to Provide Assistance: Yes  Type of Caregiver Assistance:  Cognitive  Recommendation:  None      Equipment:   none per SLP  Reasons for discharge: Treatment goals met   Patient/Family Agrees with Progress Made and Goals Achieved: Yes    Ellery Plunk 05/16/2023, 12:19 PM

## 2023-05-16 NOTE — Progress Notes (Signed)
PROGRESS NOTE   Subjective/Complaints: No new complaints this morning Continues to have left knee pain, voltaren gel scheduled, discussed history of GI bleed, recommended refusing voltaren when knee pain is well controlled  ROS:  Right ankle pain-controlled, +fatigue, +left knee pain- continues   Objective:   No results found. No results for input(s): "WBC", "HGB", "HCT", "PLT" in the last 72 hours.   No results for input(s): "NA", "K", "CL", "CO2", "GLUCOSE", "BUN", "CREATININE", "CALCIUM" in the last 72 hours.    Intake/Output Summary (Last 24 hours) at 05/16/2023 1030 Last data filed at 05/15/2023 1300 Gross per 24 hour  Intake 118 ml  Output --  Net 118 ml        Physical Exam: Vital Signs Blood pressure (!) 150/58, pulse (!) 59, temperature 98 F (36.7 C), temperature source Oral, resp. rate 17, height 5\' 4"  (1.626 m), weight 102.7 kg, SpO2 98%.      General: awake, alert, appropriate, sitting up in bed; NAD HENT: conjugate gaze; oropharynx moist CV: Bradycardic Pulmonary: CTA B/L; no W/R/R- good air movement GI: soft, NT, ND, (+)BS- more normoactive Psychiatric: appropriate Neurological: Ox3 Extremities; in Boot on RLE/ Ankle- steristrips on medial ankle  Musculoskeletal:        General: Swelling present. Normal range of motion.     Cervical back: Normal range of motion.     Comments: Right leg in splint  Skin: No breakdown noted in the visible portion Neurological:     Mental Status: She is alert.     Comments: Alert and oriented x 3. Normal insight and awareness. Intact Memory. Normal language and speech. Cranial nerve exam unremarkable. MMT: UE 4/5 prox to 4+/5 distal. RLE- 2/5 (pain), able to wiggle toes. LLE 3/5 HF, KE and 4+/5 ADF/PF.  Sensory exam normal for light touch and pain in all 4 limbs. No limb ataxia or cerebellar signs. No abnormal tone appreciated.  Stable 05/16/23 Psychiatric:         Mood and Affect: Mood normal.        Behavior: Behavior normal.      Assessment/Plan: 1. Functional deficits which require 3+ hours per day of interdisciplinary therapy in a comprehensive inpatient rehab setting. Physiatrist is providing close team supervision and 24 hour management of active medical problems listed below. Physiatrist and rehab team continue to assess barriers to discharge/monitor patient progress toward functional and medical goals  Care Tool:  Bathing    Body parts bathed by patient: Right arm, Left arm, Chest, Abdomen, Front perineal area, Buttocks, Right upper leg, Left upper leg, Right lower leg, Left lower leg, Face   Body parts bathed by helper: Left lower leg, Right lower leg     Bathing assist Assist Level: Supervision/Verbal cueing     Upper Body Dressing/Undressing Upper body dressing   What is the patient wearing?: Pull over shirt    Upper body assist Assist Level: Set up assist    Lower Body Dressing/Undressing Lower body dressing      What is the patient wearing?: Underwear/pull up, Pants     Lower body assist Assist for lower body dressing: Minimal Assistance - Patient > 75%  Toileting Toileting    Toileting assist Assist for toileting: Minimal Assistance - Patient > 75%     Transfers Chair/bed transfer  Transfers assist     Chair/bed transfer assist level: Minimal Assistance - Patient > 75%     Locomotion Ambulation   Ambulation assist      Assist level: Minimal Assistance - Patient > 75% Assistive device: Walker-rolling Max distance: 15   Walk 10 feet activity   Assist  Walk 10 feet activity did not occur: Safety/medical concerns (Per PT Eval)  Assist level: Minimal Assistance - Patient > 75% Assistive device: Walker-rolling   Walk 50 feet activity   Assist Walk 50 feet with 2 turns activity did not occur: Safety/medical concerns         Walk 150 feet activity   Assist Walk 150 feet activity  did not occur: Safety/medical concerns         Walk 10 feet on uneven surface  activity   Assist Walk 10 feet on uneven surfaces activity did not occur: Safety/medical concerns         Wheelchair     Assist Is the patient using a wheelchair?: Yes Type of Wheelchair: Manual    Wheelchair assist level: Supervision/Verbal cueing Max wheelchair distance: 100'    Wheelchair 50 feet with 2 turns activity    Assist        Assist Level: Supervision/Verbal cueing   Wheelchair 150 feet activity     Assist      Assist Level: Supervision/Verbal cueing   Blood pressure (!) 150/58, pulse (!) 59, temperature 98 F (36.7 C), temperature source Oral, resp. rate 17, height 5\' 4"  (1.626 m), weight 102.7 kg, SpO2 98%.  Medical Problem List and Plan: 1. Functional deficits secondary to right trimalleolar ankle fx after syncopal episode.              -patient may shower if RLE is covered             -ELOS/Goals: 8-13 days,  mod I to supervision goals with PT and OT  -est DC 2/1  Continue CIR PT and OT   2.  Antithrombotics: SCDs ordered. Bilateral lower extremities (chemo prophy d/c due to epistaxis, anemia)             -antiplatelet therapy: none   3. Pain Management:  -continue Tylenol 1000 mg q 8 hours             -continue oxycodone 10 mg q 4 hours prn  -1/23-1/25 pain controlled, continue current    1/26- pain controlled with meds 4. Chronic depression/Insomnia: LCSW to evaluate and provide emotional support             -continue Celexa 20 mg daily             -trazodone 50 mg q HS prn sleep             -pt was on at bedtime remeron and xanax prn at home (not resumed)             -antipsychotic agents: n/a   5. Neuropsych/cognition: This patient is capable of making decisions on her own behalf.   6. Skin/Wound Care: Routine skin care checks   7. Fluids/Electrolytes/Nutrition: Routine Is and Os and follow-up chemistries   8: Hypertension: monitor TID  and prn             -continue amlodipine 10 mg daily             -  increase lisinopril to 10mg  daily  -1/21 fair control continue to monitor trend for now  1/25- BP ocntrolled- Cards increased hydralazine to 10 mg TID when stopped Norvasc  Currently hypotensive: decrease lisinopril to 5mg  daily    05/16/2023    9:28 AM 05/16/2023    5:29 AM 05/16/2023    5:27 AM  Vitals with BMI  Systolic 150 152 161  Diastolic 58 66 66  Pulse 59 50 50      9: Hyperlipidemia: continue statin   10: GERD: continue PPI   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 [facility]             -follow-up CBC             -goal to keep hgb >8  -h/h stable   1/25- Hb 7.7- is overall a little less, but was running 8.1 or so- will monitor 14: Intermittent epistaxis due to #11: - Afrin, other measures as needed             -consider ENT consultation for hemostasis              15: Right ankle fracture s/p ORIF 1/19, Dr. Hulda Humphrey (has nylon sutures and short leg splint with posterior slab and stirrups)             -NWB for 8-10 weeks             -follow-up ortho in 2-3 weeks post-op   1/26- got 1/2 sutures out- asked Charge nurse to see if can get rest of sutures out/find them? 16: Tobacco use: cessation counseling   17: Code status: DNR-limited    19: DM-2: A1c = 5.0% October 2024 (glucoses ~100-120s on regular diet)             -home metformin 500 mg BID not restarted             -d/c CBGs  D/c Boost  -Glucose stable on BMP today 1/20   20: Thrombocytosis: resolved   21: New onset nausea:          resolved  22. Diastolic hypotension: resolved  23. Obesity:  provide dietary education  24. Constipation: BM 1/28, continue senna 1 tab HS   25. HTN: continue lisinopril   26. Hypotension: resolved, d/c hydralazine  27. AKI  Cr reviewed and resolved  D/c hydralazine  28. Bradycardia: d/c hydralazine  29. Iron deficiency anemia: Venofer ordered, discussed that this is a weekly injection  20. Anxiety: prn ativan ordered, decrease to q8H prn  21. Left knee pain: continue prn hot packs, voltaren gel ordered  LOS: 14 days A FACE TO FACE EVALUATION WAS PERFORMED  Srijan Givan P Jon Kasparek 05/16/2023, 10:30 AM

## 2023-05-16 NOTE — Plan of Care (Incomplete)
   Problem: Consults Goal: RH GENERAL PATIENT EDUCATION Description: See Patient Education module for education specifics. Outcome: Progressing   Problem: RH BOWEL ELIMINATION Goal: RH STG MANAGE BOWEL WITH ASSISTANCE Description: STG Manage Bowel with toileting Assistance. Outcome: Progressing Goal: RH STG MANAGE BOWEL W/MEDICATION W/ASSISTANCE Description: STG Manage Bowel with Medication with mod I Assistance. Outcome: Progressing   Problem: RH BLADDER ELIMINATION Goal: RH STG MANAGE BLADDER WITH ASSISTANCE Description: STG Manage Bladder With toileting Assistance Outcome: Progressing   Problem: RH SKIN INTEGRITY Goal: RH STG SKIN FREE OF INFECTION/BREAKDOWN Description: Manage skin w min assist Outcome: Progressing Goal: RH STG MAINTAIN SKIN INTEGRITY WITH ASSISTANCE Description: STG Maintain Skin Integrity With min Assistance. Outcome: Progressing Goal: RH STG ABLE TO PERFORM INCISION/WOUND CARE W/ASSISTANCE Description: STG Able To Perform Incision/Wound Care With min Assistance. Outcome: Progressing   Problem: RH SAFETY Goal: RH STG ADHERE TO SAFETY PRECAUTIONS W/ASSISTANCE/DEVICE Description: STG Adhere to Safety Precautions With cues  Assistance/Device. Outcome: Progressing   Problem: RH PAIN MANAGEMENT Goal: RH STG PAIN MANAGED AT OR BELOW PT'S PAIN GOAL Description: < 4 with prns Outcome: Progressing   Problem: RH KNOWLEDGE DEFICIT GENERAL Goal: RH STG INCREASE KNOWLEDGE OF SELF CARE AFTER HOSPITALIZATION Description: Patient and family will be able to manage care at discharge using educational resources for medication, skin care and dietary modification independently Outcome: Progressing

## 2023-05-16 NOTE — Plan of Care (Signed)
  Problem: RH Problem Solving Goal: LTG Patient will demonstrate problem solving for (SLP) Description: LTG:  Patient will demonstrate problem solving for basic/complex daily situations with cues  (SLP) Flowsheets Taken 05/16/2023 1205 by Ellery Plunk, CCC-SLP LTG Patient will demonstrate problem solving for: Supervision Taken 05/06/2023 1631 by Renaee Munda, CCC-SLP LTG: Patient will demonstrate problem solving for (SLP): Complex daily situations   Problem: RH Memory Goal: LTG Patient will use memory compensatory aids to (SLP) Description: LTG:  Patient will use memory compensatory aids to recall biographical/new, daily complex information with cues (SLP) Flowsheets (Taken 05/06/2023 1631 by Renaee Munda, CCC-SLP) LTG: Patient will use memory compensatory aids to (SLP): Supervision

## 2023-05-16 NOTE — Progress Notes (Signed)
Patient ID: Peggy House, female   DOB: 09-06-1960, 63 y.o.   MRN: [id number]  SW met with pt in room to follow-up if she has heard from her family. States she is going to call her brother and sister once they get off work. States she is aware her son Peggy House is off today. SW shared will follow-up with her on Monday.  1422-SW spoke with pt son Peggy House to provide updates on pt discharge, d/c date 2/4, HHA in place and DME pt will come to home with. He confirms she will to return to his home. Will discuss with his brothers who will pick up pt on Tuesday. States SW can follow-up with pt to discuss.    Cecile Sheerer, MSW, LCSW Office: [phone] Cell: [phone] Fax: [phone]

## 2023-05-16 NOTE — Plan of Care (Signed)
   Problem: Consults Goal: RH GENERAL PATIENT EDUCATION Description: See Patient Education module for education specifics. Outcome: Progressing   Problem: RH BOWEL ELIMINATION Goal: RH STG MANAGE BOWEL WITH ASSISTANCE Description: STG Manage Bowel with toileting Assistance. Outcome: Progressing Goal: RH STG MANAGE BOWEL W/MEDICATION W/ASSISTANCE Description: STG Manage Bowel with Medication with mod I Assistance. Outcome: Progressing   Problem: RH BLADDER ELIMINATION Goal: RH STG MANAGE BLADDER WITH ASSISTANCE Description: STG Manage Bladder With toileting Assistance Outcome: Progressing   Problem: RH SKIN INTEGRITY Goal: RH STG SKIN FREE OF INFECTION/BREAKDOWN Description: Manage skin w min assist Outcome: Progressing Goal: RH STG MAINTAIN SKIN INTEGRITY WITH ASSISTANCE Description: STG Maintain Skin Integrity With min Assistance. Outcome: Progressing Goal: RH STG ABLE TO PERFORM INCISION/WOUND CARE W/ASSISTANCE Description: STG Able To Perform Incision/Wound Care With min Assistance. Outcome: Progressing   Problem: RH SAFETY Goal: RH STG ADHERE TO SAFETY PRECAUTIONS W/ASSISTANCE/DEVICE Description: STG Adhere to Safety Precautions With cues  Assistance/Device. Outcome: Progressing   Problem: RH PAIN MANAGEMENT Goal: RH STG PAIN MANAGED AT OR BELOW PT'S PAIN GOAL Description: < 4 with prns Outcome: Progressing   Problem: RH KNOWLEDGE DEFICIT GENERAL Goal: RH STG INCREASE KNOWLEDGE OF SELF CARE AFTER HOSPITALIZATION Description: Patient and family will be able to manage care at discharge using educational resources for medication, skin care and dietary modification independently Outcome: Progressing

## 2023-05-16 NOTE — Progress Notes (Signed)
Physical Therapy Session Note  Patient Details  Name: Peggy House MRN: [id number] Date of Birth: [birth year]  Today's Date: 05/16/2023 PT Individual Time: 0800-0912 PT Individual Time Calculation (min): 72 min   Short Term Goals: Week 2:  PT Short Term Goal 1 (Week 2): STG=LTG due to LOS  Skilled Therapeutic Interventions/Progress Updates:       Pt in bed - reports generalized fatigue, L knee (arthritis) and R ankle soreness - unrated but states that she received pain medication earlier from nursing. Rest breaks and mobility provided for pain management.   Supine to sitting EOB with supervision using hospital bed features. Able to scoot herself to EOB with ++ time at supervision level. Donned hospital socks for time management and she completed stand pivot transfer from elevated surface to wheelchair using the RW and minA for sitanding and CGA for balance as she transfers. Pt compliant with NWB restrictions during transfer.   Pt requesting to complete oral care and face washing at the sink - completed at seated level with setupA. Pt requesting a tri-fold mirror to complete self care tasks - unable to locate but did provide her with a single stand alone mirror for temporary use. Pt complains of R shoulder pain from arthritis, limiting shoulder ROM for overhead reaching.   Wheeled at wheelchair level to day room rehab gym. Stand pivot transfer using RW with heavy minA for powering to rise from w/c height - CGA for balance as she pivots to mat table. Completed seated there-ex for UE and LE strengthening using yellow TB for resistance: -2x10 horizontal abduction -2x10 horizontal adduction -2x10 LAQ -2x10 hamstring curls *rest breaks b/w sets, PT Cueing for sequencing and muscle activation  Stand pivot transfer back into wheelchair with increased assist due to fatigue and low surface height, modA for powering. CGA for balance as she transfers into wheelchair. Returned to her room and  left sitting upright in wheelchair with breakfast tray setup for her. All needs met at end.     Therapy Documentation Precautions:  Precautions Precautions: Fall Precaution Comments: RLE NWB Required Braces or Orthoses: Other Brace Other Brace: RLE CAM boot Restrictions Weight Bearing Restrictions Per Provider Order: Yes RLE Weight Bearing Per Provider Order: Non weight bearing General:     Therapy/Group: Individual Therapy  Orrin Brigham 05/16/2023, 7:49 AM

## 2023-05-17 DIAGNOSIS — D500 Iron deficiency anemia secondary to blood loss (chronic): Secondary | ICD-10-CM | POA: Diagnosis not present

## 2023-05-17 DIAGNOSIS — S82851D Displaced trimalleolar fracture of right lower leg, subsequent encounter for closed fracture with routine healing: Secondary | ICD-10-CM | POA: Diagnosis not present

## 2023-05-17 NOTE — Progress Notes (Signed)
Physical Therapy Session Note  Patient Details  Name: Peggy House MRN: [id number] Date of Birth: 03-May-1960  Today's Date: 05/17/2023 PT Individual Time: 0254-0339 PT Individual Time Calculation (min): 45 min   Short Term Goals: Week 1:  PT Short Term Goal 1 (Week 1): Pt will complete bed mobility with CGA PT Short Term Goal 1 - Progress (Week 1): Met PT Short Term Goal 2 (Week 1): Pt will complete bed<>chair transfers with minA and LRAD PT Short Term Goal 2 - Progress (Week 1): Met PT Short Term Goal 3 (Week 1): Pt will hop 63ft with minA and LRAD PT Short Term Goal 3 - Progress (Week 1): Met  Skilled Therapeutic Interventions/Progress Updates:   Pt received sitting in w/c in room. Pt without complaints of pain. Pt agreeable to PT tx. When PT arrived to room, pt was on call bell asking for a nurse to let the nurse know that her family would be late to therapy family education. PT asked her if education was supposed to be performed today as it was not in PT notes. Pt stated it was but her family would be late due to unloading a truck and she wasn't sure when they would be there. Nursing Peggy House came in to answer call light notification and stated that family education was also not on their schedule. Pt notified Peggy House that family would be late and didn't know when they would arrive. PT discussed with pt that education would need to be rescheduled due to not knowing a time and nursing stated they would notify social worker and PA that family education had not been on schedule and that it would need to be rescheduled anyway due to family not being present. Nursing stated that epic chat would only store note for 24 hours before deleting and she was working on Sunday and Monday so she would let social worker/PA know. PT also suggested to pt and discussed with nursing that family education could happen on Monday, one day prior to DC date, and pt would need to let her family know therapy times  when she got that Monday schedule. Pt agreed.  Pt stated she needed to still work on transfers from w/c to bed to toilet chair in prep for DC. Therefore, therapy session focused on these transfers with PT being CGA to light min A to assist with transfer. Pt remained NWB throughout with CAM boot on R LE. PT even simulated pt telling her where to stand and place hands for transfer in case she needed to tell her family in case they forgot after training was done and pt was at home. Pt did a good job of telling PT what to do.  W/C mobility also performed in tight places and down hallway around obstacles to simulate home environment. Pt with difficulty putting foot rests on without assistance as well. When pt/PT arrived back to room, pt's sister was calling stating she could not attend training due to an upset stomach.  PT once again reminded pt that family could come to one of her therapy visits on Monday for family education due to them not being here today and she would need to tell them the schedule and that Peggy House was to notify SW/PA as well. Pt left in w/c; seat belt alarm on; no complaints of pain. All needs within reach.      Therapy Documentation Precautions:  Precautions Precautions: Fall Precaution Comments: RLE NWB Required Braces or Orthoses: Other Brace Other Brace: RLE CAM  boot Restrictions Weight Bearing Restrictions Per Provider Order: Yes RLE Weight Bearing Per Provider Order: Non weight bearing      Therapy/Group: Individual Therapy  Peggy House 05/17/2023, 7:35 AM

## 2023-05-17 NOTE — Progress Notes (Signed)
PROGRESS NOTE   Subjective/Complaints:   No dizziness with sitting this morning.  She had a nosebleed yesterday but she stated that it was not bad.  She has not noted any blood in her stools ROS:  No CP, SOB, N/V/D   Objective:   No results found. No results for input(s): "WBC", "HGB", "HCT", "PLT" in the last 72 hours.   No results for input(s): "NA", "K", "CL", "CO2", "GLUCOSE", "BUN", "CREATININE", "CALCIUM" in the last 72 hours.   No intake or output data in the 24 hours ending 05/17/23 1159       Physical Exam: Vital Signs Blood pressure (!) 143/67, pulse (!) 50, temperature 98.2 F (36.8 C), temperature source Oral, resp. rate 18, height 5\' 4"  (1.626 m), weight 102.7 kg, SpO2 98%.  General: No acute distress Mood and affect are appropriate Heart: Regular rate and rhythm no rubs murmurs or extra sounds Lungs: Clear to auscultation, breathing unlabored, no rales or wheezes Abdomen: Positive bowel sounds, soft nontender to palpation, nondistended Extremities: No clubbing, cyanosis, or edema   Musculoskeletal:        General: Swelling present. Normal range of motion.     Cervical back: Normal range of motion.     Comments: Right leg in splint  Skin: No breakdown noted in the visible portion Neurological:     Mental Status: She is alert.     Comments: Alert and oriented x 3. Normal insight and awareness. Intact Memory. Normal language and speech. Cranial nerve exam unremarkable. MMT: UE 4/5 prox to 4+/5 distal. RLE- 2/5 (pain), able to wiggle toes. LLE 3/5 HF, KE and 4+/5 ADF/PF.  Sensory exam normal for light touch and pain in all 4 limbs. No limb ataxia or cerebellar signs. No abnormal tone appreciated.  Stable 05/16/23 Psychiatric:        Mood and Affect: Mood normal.        Behavior: Behavior normal.      Assessment/Plan: 1. Functional deficits which require 3+ hours per day of interdisciplinary therapy  in a comprehensive inpatient rehab setting. Physiatrist is providing close team supervision and 24 hour management of active medical problems listed below. Physiatrist and rehab team continue to assess barriers to discharge/monitor patient progress toward functional and medical goals  Care Tool:  Bathing    Body parts bathed by patient: Right arm, Left arm, Chest, Abdomen, Front perineal area, Buttocks, Right upper leg, Left upper leg, Right lower leg, Left lower leg, Face   Body parts bathed by helper: Left lower leg, Right lower leg     Bathing assist Assist Level: Supervision/Verbal cueing     Upper Body Dressing/Undressing Upper body dressing   What is the patient wearing?: Pull over shirt    Upper body assist Assist Level: Set up assist    Lower Body Dressing/Undressing Lower body dressing      What is the patient wearing?: Underwear/pull up, Pants     Lower body assist Assist for lower body dressing: Minimal Assistance - Patient > 75%     Toileting Toileting    Toileting assist Assist for toileting: Minimal Assistance - Patient > 75%     Transfers Chair/bed transfer  Transfers assist     Chair/bed transfer assist level: Minimal Assistance - Patient > 75%     Locomotion Ambulation   Ambulation assist      Assist level: Minimal Assistance - Patient > 75% Assistive device: Walker-rolling Max distance: 15   Walk 10 feet activity   Assist  Walk 10 feet activity did not occur: Safety/medical concerns (Per PT Eval)  Assist level: Minimal Assistance - Patient > 75% Assistive device: Walker-rolling   Walk 50 feet activity   Assist Walk 50 feet with 2 turns activity did not occur: Safety/medical concerns         Walk 150 feet activity   Assist Walk 150 feet activity did not occur: Safety/medical concerns         Walk 10 feet on uneven surface  activity   Assist Walk 10 feet on uneven surfaces activity did not occur: Safety/medical  concerns         Wheelchair     Assist Is the patient using a wheelchair?: Yes Type of Wheelchair: Manual    Wheelchair assist level: Supervision/Verbal cueing Max wheelchair distance: 100'    Wheelchair 50 feet with 2 turns activity    Assist        Assist Level: Supervision/Verbal cueing   Wheelchair 150 feet activity     Assist      Assist Level: Supervision/Verbal cueing   Blood pressure (!) 143/67, pulse (!) 50, temperature 98.2 F (36.8 C), temperature source Oral, resp. rate 18, height 5\' 4"  (1.626 m), weight 102.7 kg, SpO2 98%.  Medical Problem List and Plan: 1. Functional deficits secondary to right trimalleolar ankle fx after syncopal episode.              -patient may shower if RLE is covered             -ELOS/Goals: 8-13 days,  mod I to supervision goals with PT and OT  -est DC 2/1  Continue CIR PT and OT   2.  Antithrombotics: SCDs ordered. Bilateral lower extremities (chemo prophy d/c due to epistaxis, anemia)             -antiplatelet therapy: none   3. Pain Management:  -continue Tylenol 1000 mg q 8 hours             -continue oxycodone 10 mg q 4 hours prn  -1/23-1/25 pain controlled, continue current    1/26- pain controlled with meds 4. Chronic depression/Insomnia: LCSW to evaluate and provide emotional support             -continue Celexa 20 mg daily             -trazodone 50 mg q HS prn sleep             -pt was on at bedtime remeron and xanax prn at home (not resumed)             -antipsychotic agents: n/a   5. Neuropsych/cognition: This patient is capable of making decisions on her own behalf.   6. Skin/Wound Care: Routine skin care checks   7. Fluids/Electrolytes/Nutrition: Routine Is and Os and follow-up chemistries   8: Hypertension: monitor TID and prn             -continue amlodipine 10 mg daily             -increase lisinopril to 10mg  daily  -1/21 fair control continue to monitor trend for now  1/25- BP  ocntrolled- Cards increased  hydralazine to 10 mg TID when stopped Norvasc  Currently hypotensive: decrease lisinopril to 5mg  daily    05/17/2023    4:10 AM 05/16/2023    8:03 PM 05/16/2023    1:28 PM  Vitals with BMI  Systolic 143 166 045  Diastolic 67 73 72  Pulse 50 56 52  Fair control, had some elevations yesterday evening, consider increasing lisinopril again    9: Hyperlipidemia: continue statin   10: GERD: continue PPI   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 Pt is transf[facility] almost [facility]pt [facility]             -follow-up CBC             -goal to keep hgb >8  -h/h stable   1/25- Hb 7.7- is overall a little less, but was running 8.1 or so- will monitor 14: Intermittent epistaxis due to #11: - Afrin, other measures as needed             -consider ENT consultation for hemostasis              15: Right ankle fracture s/p ORIF 1/19, Dr. Hulda Humphrey (has nylon sutures and short leg splint with posterior slab and stirrups)             -NWB for 8-10 weeks             -follow-up ortho in 2-3 weeks post-op   1/26- got 1/2 sutures out- asked Charge nurse to see if can get rest of sutures out/find them? 16: Tobacco use: cessation counseling   17: Code status: DNR-limited    19: DM-2: A1c = 5.0% October 2024 (glucoses ~100-120s on regular diet)             -home metformin 500 mg BID not restarted- f/u PCP         20: Thrombocytosis: resolved   21: New  onset nausea:          resolved  22. Diastolic hypotension: resolved  23. Obesity: provide dietary education  24. Constipation: BM 1/28, continue senna 1 tab HS   25. HTN: continue lisinopril   26. Hypotension: resolved, d/c hydralazine  27. AKI  Cr reviewed and resolved  D/c hydralazine  28. Bradycardia: d/c hydralazine  29. Iron deficiency anemia: Venofer ordered, discussed that this is a weekly injection  20. Anxiety: prn ativan ordered, decrease to q8H prn  21. Left knee pain: continue prn hot packs, voltaren gel ordered  LOS: 15 days A FACE TO FACE EVALUATION WAS PERFORMED  Erick Colace 05/17/2023, 11:59 AM

## 2023-05-17 NOTE — Plan of Care (Signed)
   Problem: Consults Goal: RH GENERAL PATIENT EDUCATION Description: See Patient Education module for education specifics. Outcome: Progressing   Problem: RH BOWEL ELIMINATION Goal: RH STG MANAGE BOWEL WITH ASSISTANCE Description: STG Manage Bowel with toileting Assistance. Outcome: Progressing Goal: RH STG MANAGE BOWEL W/MEDICATION W/ASSISTANCE Description: STG Manage Bowel with Medication with mod I Assistance. Outcome: Progressing   Problem: RH BLADDER ELIMINATION Goal: RH STG MANAGE BLADDER WITH ASSISTANCE Description: STG Manage Bladder With toileting Assistance Outcome: Progressing   Problem: RH SKIN INTEGRITY Goal: RH STG SKIN FREE OF INFECTION/BREAKDOWN Description: Manage skin w min assist Outcome: Progressing Goal: RH STG MAINTAIN SKIN INTEGRITY WITH ASSISTANCE Description: STG Maintain Skin Integrity With min Assistance. Outcome: Progressing Goal: RH STG ABLE TO PERFORM INCISION/WOUND CARE W/ASSISTANCE Description: STG Able To Perform Incision/Wound Care With min Assistance. Outcome: Progressing   Problem: RH SAFETY Goal: RH STG ADHERE TO SAFETY PRECAUTIONS W/ASSISTANCE/DEVICE Description: STG Adhere to Safety Precautions With cues  Assistance/Device. Outcome: Progressing   Problem: RH PAIN MANAGEMENT Goal: RH STG PAIN MANAGED AT OR BELOW PT'S PAIN GOAL Description: < 4 with prns Outcome: Progressing   Problem: RH KNOWLEDGE DEFICIT GENERAL Goal: RH STG INCREASE KNOWLEDGE OF SELF CARE AFTER HOSPITALIZATION Description: Patient and family will be able to manage care at discharge using educational resources for medication, skin care and dietary modification independently Outcome: Progressing

## 2023-05-18 DIAGNOSIS — I10 Essential (primary) hypertension: Secondary | ICD-10-CM | POA: Diagnosis not present

## 2023-05-18 DIAGNOSIS — G8918 Other acute postprocedural pain: Secondary | ICD-10-CM

## 2023-05-18 DIAGNOSIS — S82851D Displaced trimalleolar fracture of right lower leg, subsequent encounter for closed fracture with routine healing: Secondary | ICD-10-CM | POA: Diagnosis not present

## 2023-05-18 LAB — CBC WITH DIFFERENTIAL/PLATELET
Abs Immature Granulocytes: 0.02 10*3/uL (ref 0.00–0.07)
Basophils Absolute: 0 10*3/uL (ref 0.0–0.1)
Basophils Relative: 1 %
Eosinophils Absolute: 0.2 10*3/uL (ref 0.0–0.5)
Eosinophils Relative: 3 %
HCT: 27.7 % — ABNORMAL LOW (ref 36.0–46.0)
Hemoglobin: 8.1 g/dL — ABNORMAL LOW (ref 12.0–15.0)
Immature Granulocytes: 0 %
Lymphocytes Relative: 16 %
Lymphs Abs: 1.2 10*3/uL (ref 0.7–4.0)
MCH: 25.9 pg — ABNORMAL LOW (ref 26.0–34.0)
MCHC: 29.2 g/dL — ABNORMAL LOW (ref 30.0–36.0)
MCV: 88.5 fL (ref 80.0–100.0)
Monocytes Absolute: 0.6 10*3/uL (ref 0.1–1.0)
Monocytes Relative: 8 %
Neutro Abs: 5.5 10*3/uL (ref 1.7–7.7)
Neutrophils Relative %: 72 %
Platelets: 175 10*3/uL (ref 150–400)
RBC: 3.13 MIL/uL — ABNORMAL LOW (ref 3.87–5.11)
RDW: 18.8 % — ABNORMAL HIGH (ref 11.5–15.5)
WBC: 7.5 10*3/uL (ref 4.0–10.5)
nRBC: 0 % (ref 0.0–0.2)

## 2023-05-18 NOTE — Plan of Care (Signed)
   Problem: Consults Goal: RH GENERAL PATIENT EDUCATION Description: See Patient Education module for education specifics. Outcome: Progressing   Problem: RH BOWEL ELIMINATION Goal: RH STG MANAGE BOWEL WITH ASSISTANCE Description: STG Manage Bowel with toileting Assistance. Outcome: Progressing Goal: RH STG MANAGE BOWEL W/MEDICATION W/ASSISTANCE Description: STG Manage Bowel with Medication with mod I Assistance. Outcome: Progressing   Problem: RH BLADDER ELIMINATION Goal: RH STG MANAGE BLADDER WITH ASSISTANCE Description: STG Manage Bladder With toileting Assistance Outcome: Progressing   Problem: RH SKIN INTEGRITY Goal: RH STG SKIN FREE OF INFECTION/BREAKDOWN Description: Manage skin w min assist Outcome: Progressing Goal: RH STG MAINTAIN SKIN INTEGRITY WITH ASSISTANCE Description: STG Maintain Skin Integrity With min Assistance. Outcome: Progressing Goal: RH STG ABLE TO PERFORM INCISION/WOUND CARE W/ASSISTANCE Description: STG Able To Perform Incision/Wound Care With min Assistance. Outcome: Progressing   Problem: RH SAFETY Goal: RH STG ADHERE TO SAFETY PRECAUTIONS W/ASSISTANCE/DEVICE Description: STG Adhere to Safety Precautions With cues  Assistance/Device. Outcome: Progressing   Problem: RH PAIN MANAGEMENT Goal: RH STG PAIN MANAGED AT OR BELOW PT'S PAIN GOAL Description: < 4 with prns Outcome: Progressing   Problem: RH KNOWLEDGE DEFICIT GENERAL Goal: RH STG INCREASE KNOWLEDGE OF SELF CARE AFTER HOSPITALIZATION Description: Patient and family will be able to manage care at discharge using educational resources for medication, skin care and dietary modification independently Outcome: Progressing

## 2023-05-18 NOTE — Progress Notes (Signed)
PROGRESS NOTE   Subjective/Complaints:   ROS:  No CP, SOB, N/V/D   Objective:   No results found. Recent Labs    05/18/23 0341  WBC 7.5  HGB 8.1*  HCT 27.7*  PLT 175     No results for input(s): "NA", "K", "CL", "CO2", "GLUCOSE", "BUN", "CREATININE", "CALCIUM" in the last 72 hours.    Intake/Output Summary (Last 24 hours) at 05/18/2023 1241 Last data filed at 05/18/2023 1100 Gross per 24 hour  Intake 474 ml  Output --  Net 474 ml         Physical Exam: Vital Signs Blood pressure (!) 152/65, pulse (!) 52, temperature 98.3 F (36.8 C), temperature source Oral, resp. rate 18, height 5\' 4"  (1.626 m), weight 102.7 kg, SpO2 100%.  General: No acute distress Mood and affect are appropriate Heart: Regular rate and rhythm no rubs murmurs or extra sounds Lungs: Clear to auscultation, breathing unlabored, no rales or wheezes Abdomen: Positive bowel sounds, soft nontender to palpation, nondistended Extremities: No clubbing, cyanosis, or edema   Musculoskeletal:        General: Swelling present. Normal range of motion.     Cervical back: Normal range of motion.     Comments: Right leg in splint  Skin: No breakdown noted in the visible portion Neurological:     Mental Status: She is alert.     Comments: Alert and oriented x 3. Normal insight and awareness. Intact Memory. Normal language and speech. Cranial nerve exam unremarkable. MMT: UE 4/5 prox to 4+/5 distal. RLE- 2/5 (pain), able to wiggle toes. LLE 3/5 HF, KE and 4+/5 ADF/PF.  Sensory exam normal for light touch and pain in all 4 limbs. No limb ataxia or cerebellar signs. No abnormal tone appreciated.  Stable 05/16/23 Psychiatric:        Mood and Affect: Mood normal.        Behavior: Behavior normal.      Assessment/Plan: 1. Functional deficits which require 3+ hours per day of interdisciplinary therapy in a comprehensive inpatient rehab  setting. Physiatrist is providing close team supervision and 24 hour management of active medical problems listed below. Physiatrist and rehab team continue to assess barriers to discharge/monitor patient progress toward functional and medical goals  Care Tool:  Bathing    Body parts bathed by patient: Right arm, Left arm, Chest, Abdomen, Front perineal area, Buttocks, Right upper leg, Left upper leg, Right lower leg, Left lower leg, Face   Body parts bathed by helper: Left lower leg, Right lower leg     Bathing assist Assist Level: Supervision/Verbal cueing     Upper Body Dressing/Undressing Upper body dressing   What is the patient wearing?: Pull over shirt    Upper body assist Assist Level: Set up assist    Lower Body Dressing/Undressing Lower body dressing      What is the patient wearing?: Underwear/pull up, Pants     Lower body assist Assist for lower body dressing: Minimal Assistance - Patient > 75%     Toileting Toileting    Toileting assist Assist for toileting: Minimal Assistance - Patient > 75%     Transfers Chair/bed transfer  Transfers  assist     Chair/bed transfer assist level: Minimal Assistance - Patient > 75%     Locomotion Ambulation   Ambulation assist      Assist level: Minimal Assistance - Patient > 75% Assistive device: Walker-rolling Max distance: 15   Walk 10 feet activity   Assist  Walk 10 feet activity did not occur: Safety/medical concerns (Per PT Eval)  Assist level: Minimal Assistance - Patient > 75% Assistive device: Walker-rolling   Walk 50 feet activity   Assist Walk 50 feet with 2 turns activity did not occur: Safety/medical concerns         Walk 150 feet activity   Assist Walk 150 feet activity did not occur: Safety/medical concerns         Walk 10 feet on uneven surface  activity   Assist Walk 10 feet on uneven surfaces activity did not occur: Safety/medical concerns          Wheelchair     Assist Is the patient using a wheelchair?: Yes Type of Wheelchair: Manual    Wheelchair assist level: Supervision/Verbal cueing Max wheelchair distance: 100'    Wheelchair 50 feet with 2 turns activity    Assist        Assist Level: Supervision/Verbal cueing   Wheelchair 150 feet activity     Assist      Assist Level: Supervision/Verbal cueing   Blood pressure (!) 152/65, pulse (!) 52, temperature 98.3 F (36.8 C), temperature source Oral, resp. rate 18, height 5\' 4"  (1.626 m), weight 102.7 kg, SpO2 100%.  Medical Problem List and Plan: 1. Functional deficits secondary to right trimalleolar ankle fx after syncopal episode.              -patient may shower if RLE is covered             -ELOS/Goals: 8-13 days,  mod I to supervision goals with PT and OT  -est DC 2/1  Continue CIR PT and OT   2.  Antithrombotics: SCDs ordered. Bilateral lower extremities (chemo prophy d/c due to epistaxis, anemia)             -antiplatelet therapy: none   3. Pain Management:  -continue Tylenol 1000 mg q 8 hours             -continue oxycodone 10 mg q 4 hours prn  -1/23-1/25 pain controlled, continue current    1/26- pain controlled with meds 4. Chronic depression/Insomnia: LCSW to evaluate and provide emotional support             -continue Celexa 20 mg daily             -trazodone 50 mg q HS prn sleep             -pt was on at bedtime remeron and xanax prn at home (not resumed)             -antipsychotic agents: n/a   5. Neuropsych/cognition: This patient is capable of making decisions on her own behalf.   6. Skin/Wound Care: Routine skin care checks   7. Fluids/Electrolytes/Nutrition: Routine Is and Os and follow-up chemistries   8: Hypertension: monitor TID and prn             -continue amlodipine 10 mg daily             -increase lisinopril to 10mg  daily  -1/21 fair control continue to monitor trend for now  1/25- BP ocntrolled- Cards increased  hydralazine  to 10 mg TID when stopped Norvasc  Was hypotensive: decrease lisinopril to 5mg  daily    05/18/2023    3:52 AM 05/17/2023    7:23 PM 05/17/2023    1:50 PM  Vitals with BMI  Systolic 152 167 811  Diastolic 65 71 65  Pulse 52 55 57  BP creeping up increased lisinopril to 10mg  yesterday , monitor      9: Hyperlipidemia: continue statin   10: GERD: continue PPI   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 Pt is transfused almost weekly as an outpt at [facility]             -follow-up CBC             -goal to keep hgb >8  -h/h stable   1/25- Hb 7.7- is overall a little less, but was running 8.1 or so- will monitor 14: Intermittent epistaxis due to #11: - Afrin, other measures as needed             -consider ENT consultation for hemostasis              15: Right ankle fracture s/p ORIF 1/19, Dr. Hulda Humphrey (has nylon sutures and short leg splint with posterior slab and stirrups)             -NWB for 8-10 weeks             -follow-up ortho in 2-3 weeks post-op Pain appears to be under good control 16: Tobacco use: cessation counseling   17: Code status: DNR-limited    19: DM-2: A1c = 5.0% October 2024 (glucoses ~100-120s on regular diet)             -home metformin 500 mg BID not restarted- f/u PCP         20: Thrombocytosis: resolved   21: New onset nausea:          resolved  22. Diastolic hypotension: resolved  23. Obesity: provide dietary education  24. Constipation: BM 1/28,  continue senna 1 tab HS   25. HTN: continue lisinopril   26. Hypotension: resolved, d/c hydralazine  27. AKI  Cr reviewed and resolved  D/c hydralazine  28. Bradycardia: d/c hydralazine  29. Iron deficiency anemia: Venofer ordered, discussed that this is a weekly injection  20. Anxiety: prn ativan ordered, decrease to q8H prn  21. Left knee pain: continue prn hot packs, voltaren gel ordered  LOS: 16 days A FACE TO FACE EVALUATION WAS PERFORMED  Erick Colace 05/18/2023, 12:41 PM

## 2023-05-19 ENCOUNTER — Encounter: Payer: Self-pay | Admitting: Hematology

## 2023-05-19 ENCOUNTER — Other Ambulatory Visit (HOSPITAL_COMMUNITY): Payer: Self-pay

## 2023-05-19 DIAGNOSIS — S82851D Displaced trimalleolar fracture of right lower leg, subsequent encounter for closed fracture with routine healing: Secondary | ICD-10-CM | POA: Diagnosis not present

## 2023-05-19 LAB — CBC
HCT: 28.7 % — ABNORMAL LOW (ref 36.0–46.0)
Hemoglobin: 8.4 g/dL — ABNORMAL LOW (ref 12.0–15.0)
MCH: 26 pg (ref 26.0–34.0)
MCHC: 29.3 g/dL — ABNORMAL LOW (ref 30.0–36.0)
MCV: 88.9 fL (ref 80.0–100.0)
Platelets: 178 10*3/uL (ref 150–400)
RBC: 3.23 MIL/uL — ABNORMAL LOW (ref 3.87–5.11)
RDW: 18.8 % — ABNORMAL HIGH (ref 11.5–15.5)
WBC: 6.7 10*3/uL (ref 4.0–10.5)
nRBC: 0 % (ref 0.0–0.2)

## 2023-05-19 LAB — BASIC METABOLIC PANEL
Anion gap: 8 (ref 5–15)
BUN: 10 mg/dL (ref 8–23)
CO2: 25 mmol/L (ref 22–32)
Calcium: 8.9 mg/dL (ref 8.9–10.3)
Chloride: 107 mmol/L (ref 98–111)
Creatinine, Ser: 0.89 mg/dL (ref 0.44–1.00)
GFR, Estimated: >60 mL/min (ref >60)
Glucose, Bld: 107 mg/dL — ABNORMAL HIGH (ref 70–99)
Potassium: 4.2 mmol/L (ref 3.5–5.1)
Sodium: 140 mmol/L (ref 135–145)

## 2023-05-19 MED ORDER — TRAZODONE HCL 50 MG PO TABS
50.0000 mg | ORAL_TABLET | Freq: Every evening | ORAL | 0 refills | Status: DC | PRN
  Filled 2023-05-19: qty 30, 30d supply, fill #0

## 2023-05-19 MED ORDER — ACETAMINOPHEN 325 MG PO TABS: 650.0000 mg | ORAL_TABLET | Freq: Four times a day (QID) | ORAL | 0 refills | Status: DC | PRN

## 2023-05-19 MED ORDER — TRAZODONE HCL 50 MG PO TABS: 50.0000 mg | ORAL_TABLET | Freq: Every evening | ORAL | 0 refills | Status: DC | PRN

## 2023-05-19 MED ORDER — SENNOSIDES-DOCUSATE SODIUM 8.6-50 MG PO TABS: 1.0000 | ORAL_TABLET | Freq: Every day | ORAL | 0 refills | Status: DC

## 2023-05-19 MED ORDER — LISINOPRIL 20 MG PO TABS
20.0000 mg | ORAL_TABLET | Freq: Every day | ORAL | 0 refills | Status: DC
  Filled 2023-05-19: qty 30, 30d supply, fill #0

## 2023-05-19 MED ORDER — SENNOSIDES-DOCUSATE SODIUM 8.6-50 MG PO TABS
1.0000 | ORAL_TABLET | Freq: Every day | ORAL | 0 refills | Status: DC
  Filled 2023-05-19: qty 100, 100d supply, fill #0

## 2023-05-19 MED ORDER — ACETAMINOPHEN 325 MG PO TABS: 650.0000 mg | ORAL_TABLET | Freq: Four times a day (QID) | ORAL | Status: DC | PRN

## 2023-05-19 MED ORDER — OXYCODONE HCL 5 MG PO TABS: 5.0000 mg | ORAL_TABLET | ORAL | 0 refills | Status: DC | PRN

## 2023-05-19 MED ORDER — SODIUM CHLORIDE 0.9 % IV SOLN
500.0000 mg | Freq: Every day | INTRAVENOUS | Status: DC
  Administered 2023-05-19: 500 mg via INTRAVENOUS
  Filled 2023-05-19 (×2): qty 25

## 2023-05-19 MED ORDER — ACETAMINOPHEN 325 MG PO TABS
650.0000 mg | ORAL_TABLET | Freq: Four times a day (QID) | ORAL | 0 refills | Status: AC | PRN
  Filled 2023-05-19: qty 100, 13d supply, fill #0

## 2023-05-19 MED ORDER — HYDRALAZINE HCL 10 MG PO TABS
10.0000 mg | ORAL_TABLET | Freq: Three times a day (TID) | ORAL | Status: DC
  Administered 2023-05-19: 10 mg via ORAL
  Filled 2023-05-19: qty 1

## 2023-05-19 MED ORDER — HYDRALAZINE HCL 10 MG PO TABS: 10.0000 mg | ORAL_TABLET | Freq: Three times a day (TID) | ORAL | 0 refills | Status: DC

## 2023-05-19 MED ORDER — LISINOPRIL 10 MG PO TABS: 10.0000 mg | ORAL_TABLET | Freq: Every day | ORAL | 0 refills | Status: DC

## 2023-05-19 MED ORDER — SENNOSIDES-DOCUSATE SODIUM 8.6-50 MG PO TABS: 1.0000 | ORAL_TABLET | Freq: Every day | ORAL | Status: DC

## 2023-05-19 MED ORDER — HEPARIN SOD (PORK) LOCK FLUSH 100 UNIT/ML IV SOLN
500.0000 [IU] | INTRAVENOUS | Status: AC | PRN
  Administered 2023-05-19: 500 [IU]

## 2023-05-19 NOTE — Progress Notes (Signed)
Occupational Therapy Session Note  Patient Details  Name: Peggy House MRN: [id number] Date of Birth: 06/07/1960  Today's Date: 05/19/2023 OT Individual Time: 1105-1200 OT Individual Time Calculation (min): 55 min    Short Term Goals: Week 2:  OT Short Term Goal 1 (Week 2): Pt will maintain NWB on RLE with supervision 100% of time OT Short Term Goal 2 (Week 2): Pt will complete sit > stand using LRAD with CGA OT Short Term Goal 3 (Week 2): Pt will donn LB clothing with CGA using AE PRN OT Short Term Goal 4 (Week 2): Pt will donn RLE CAM boot with mod A  Skilled Therapeutic Interventions/Progress Updates:  Skilled OT intervention completed with focus on DC planning, functional transfers, BUE endurance. Pt received upright in bed, agreeable to session. No pain reported.  Per notes from the weekend, caregivers did not attend informal education therefore pt without proper handover of care for DC tomorrow. Discussed with team about CGA/min A level with formal education needed due to cog/physical/self-care needs. Pt agreeable to call cousin who reportedly is in charge of picking her up, and OT discussed with cousin over the phone her attendance to family education from 10-12 with OT/PT. Cousin understands this is last opportunity, with DC 2/5; team made aware.   Transitioned to EOB with mod I with increased time/effort. Pt required 3 attempts to fully stand, with OT counting per pt request to sequence the stand. On 3rd attempt, pt required mod A to stand from slightly elevated bed using RW then CGA stand pivot > w/c using RW with NWB adherence on RLE without CAM boot on. Seated at sink, pt completed facial hygiene and donned head wrap with set up A.   Transported dependently in w/c <> gym. Pt completed the following intervals while seated at the SCIFIT to promote global/BUE endurance needed for independence with BADLs and functional transfers: -5 min, level 3.5, forwards -5 min, level 3.5,  backwards Intermittent rest breaks needed for fatigue  Pt participated in the following dynamic standing balance and endurance tasks to promote independence and safety during BADLs and functional mobility: -placing and retrieving horse shoes from top of long mirror. Mod A sit > stand with RW, and CGA needed for balance when using LUE, but min A when using RUE; seated rest needed for fatigue, with RUE noted to have ROM deficits when reaching over head  Back in room, pt remained seated in w/c, with chair alarm on/activated, and with all needs in reach at end of session.   Therapy Documentation Precautions:  Precautions Precautions: Fall Precaution Comments: RLE NWB Required Braces or Orthoses: Other Brace Other Brace: RLE CAM boot Restrictions Weight Bearing Restrictions Per Provider Order: Yes RLE Weight Bearing Per Provider Order: Non weight bearing    Therapy/Group: Individual Therapy  Melvyn Novas, MS, OTR/L  05/19/2023, 12:06 PM

## 2023-05-19 NOTE — Progress Notes (Signed)
Physical Therapy Session Note  Patient Details  Name: Peggy House MRN: [id number] Date of Birth: 1960/09/20  Today's Date: 05/19/2023 PT Missed Time: 75 Minutes Missed Time Reason: Other (Comment) (pending discharge)  Short Term Goals: Week 2:  PT Short Term Goal 1 (Week 2): STG=LTG due to LOS  Skilled Therapeutic Interventions/Progress Updates:      Therapy Documentation Precautions:  Precautions Precautions: Fall Precaution Comments: RLE NWB Required Braces or Orthoses: Other Brace Other Brace: RLE CAM boot Restrictions Weight Bearing Restrictions Per Provider Order: Yes RLE Weight Bearing Per Provider Order: Non weight bearing General: PT Amount of Missed Time (min): 75 Minutes PT Missed Treatment Reason: Other (Comment) (pending discharge)  Pt missed 75 minutes of skilled PT as pt declined as pending discharge and on IV iron. PT assessed pain interference for discharge planning.     Therapy/Group: Individual Therapy  Truitt Leep Truitt Leep PT, DPT  05/19/2023, 3:24 PM

## 2023-05-19 NOTE — Progress Notes (Signed)
Occupational Therapy Discharge Summary  Patient Details  Name: Peggy House MRN: [id number] Date of Birth: [birth year]  Date of Discharge from OT service:May 19, 2023  Patient has met 6 of 7 long term goals due to improved activity tolerance, improved balance, ability to compensate for deficits, and improved coordination.  Patient to discharge at overall CGA-Min Assist level.  Patient's care partner is independent to provide the necessary physical and cognitive assistance at discharge. Family did not attend any hands on training sessions despite multiple attempts made to arrange by CSW.  Reasons goals not met: Toileting goal not met at [facility] level due to min A needed for safety as a result of dynamic balance and endurance deficits  Recommendation:  Patient will benefit from ongoing skilled OT services in home health setting to continue to advance functional skills in the area of BADL and Reduce care partner burden.  Equipment: TTB  Reasons for discharge: treatment goals met and discharge from hospital  Patient/family agrees with progress made and goals achieved: Yes  OT Discharge Precautions/Restrictions  Precautions Precautions: Fall Required Braces or Orthoses: Other Brace Other Brace: RLE CAM boot Restrictions Weight Bearing Restrictions Per Provider Order: Yes RLE Weight Bearing Per Provider Order: Non weight bearing ADL ADL Equipment Provided: Long-handled sponge Eating: Independent Where Assessed-Eating: Wheelchair Grooming: Independent Where Assessed-Grooming: Sitting at sink Upper Body Bathing: Setup Where Assessed-Upper Body Bathing: Sitting at sink Lower Body Bathing: Supervision/safety Where Assessed-Lower Body Bathing: Shower Upper Body Dressing: Setup Where Assessed-Upper Body Dressing: Sitting at sink Lower Body Dressing: Contact guard Where Assessed-Lower Body Dressing: Sitting at sink, Standing at sink Toileting: Minimal assistance Where  Assessed-Toileting: IT consultant Method: Surveyor, minerals: Raised toilet seat, Grab bars Tub/Shower Transfer: Scientific laboratory technician Method: Engineer, technical sales: Insurance underwriter: Dependent Film/video editor Method: Other (comment) (stedy) Astronomer: Sales promotion account executive Baseline Vision/History: 1 Wears glasses;4 Cataracts Patient Visual Report: No change from baseline Vision Assessment?: No apparent visual deficits Perception  Perception: Within Functional Limits Praxis Praxis: WFL Cognition Cognition Overall Cognitive Status: History of cognitive impairments - at baseline Arousal/Alertness: Awake/alert Memory: Impaired Awareness: Appears intact Problem Solving: Impaired Safety/Judgment: Appears intact Brief Interview for Mental Status (BIMS) Repetition of Three Words (First Attempt): 3 Temporal Orientation: Year: Correct Temporal Orientation: Month: Accurate within 5 days Temporal Orientation: Day: Correct Recall: "Sock": Yes, no cue required Recall: "Blue": Yes, after cueing ("a color") Recall: "Bed": Yes, no cue required BIMS Summary Score: 14 Sensation Sensation Light Touch: Appears Intact Hot/Cold: Appears Intact Stereognosis: Appears Intact Coordination Gross Motor Movements are Fluid and Coordinated: No Fine Motor Movements are Fluid and Coordinated: Yes Coordination and Movement Description: Limited by pain, generalized weakness, body habitus, NWB precautions, arthritis, and R ankle fx Motor  Motor Motor: Other (comment) Motor - Skilled Clinical Observations: Generalized weakness and deconditioning s/p ORIF R ankle Mobility  Bed Mobility Bed Mobility: Rolling Right;Rolling Left;Supine to Sit;Sit to Supine Rolling Right: Independent with assistive device Rolling Left: Independent with assistive device Supine to Sit:  Independent with assistive device Transfers Sit to Stand: Contact Guard/Touching assist (from elevated surface otherwise min A) Stand to Sit: Contact Guard/Touching assist  Trunk/Postural Assessment  Cervical Assessment Cervical Assessment: Within Functional Limits Thoracic Assessment Thoracic Assessment: Within Functional Limits Lumbar Assessment Lumbar Assessment: Within Functional Limits Postural Control Postural Control: Within Functional Limits  Balance Balance Balance Assessed: Yes Static Sitting Balance Static Sitting - Balance  Support: Feet supported;No upper extremity supported Static Sitting - Level of Assistance: 7: Independent Dynamic Sitting Balance Dynamic Sitting - Balance Support: Feet supported;No upper extremity supported Dynamic Sitting - Level of Assistance: 5: Stand by assistance (supervision) Static Standing Balance Static Standing - Balance Support: During functional activity;Bilateral upper extremity supported Static Standing - Level of Assistance: 5: Stand by assistance (supervision) Dynamic Standing Balance Dynamic Standing - Balance Support: During functional activity;Left upper extremity supported Dynamic Standing - Level of Assistance: 5: Stand by assistance (CGA) Dynamic Standing - Comments: RW with gait and transfers Extremity/Trunk Assessment RUE Assessment RUE Assessment: Within Functional Limits Active Range of Motion (AROM) Comments: Shoulder flexion limited to 120 degrees General Strength Comments: 4-/5 LUE Assessment LUE Assessment: Within Functional Limits   Bobi Daudelin E Adiel Erney, MS, OTR/L  05/19/2023, 3:46 PM

## 2023-05-19 NOTE — Plan of Care (Signed)
   Problem: Consults Goal: RH GENERAL PATIENT EDUCATION Description: See Patient Education module for education specifics. Outcome: Progressing   Problem: RH BOWEL ELIMINATION Goal: RH STG MANAGE BOWEL WITH ASSISTANCE Description: STG Manage Bowel with toileting Assistance. Outcome: Progressing Goal: RH STG MANAGE BOWEL W/MEDICATION W/ASSISTANCE Description: STG Manage Bowel with Medication with mod I Assistance. Outcome: Progressing   Problem: RH BLADDER ELIMINATION Goal: RH STG MANAGE BLADDER WITH ASSISTANCE Description: STG Manage Bladder With toileting Assistance Outcome: Progressing   Problem: RH SKIN INTEGRITY Goal: RH STG SKIN FREE OF INFECTION/BREAKDOWN Description: Manage skin w min assist Outcome: Progressing Goal: RH STG MAINTAIN SKIN INTEGRITY WITH ASSISTANCE Description: STG Maintain Skin Integrity With min Assistance. Outcome: Progressing Goal: RH STG ABLE TO PERFORM INCISION/WOUND CARE W/ASSISTANCE Description: STG Able To Perform Incision/Wound Care With min Assistance. Outcome: Progressing   Problem: RH SAFETY Goal: RH STG ADHERE TO SAFETY PRECAUTIONS W/ASSISTANCE/DEVICE Description: STG Adhere to Safety Precautions With cues  Assistance/Device. Outcome: Progressing   Problem: RH PAIN MANAGEMENT Goal: RH STG PAIN MANAGED AT OR BELOW PT'S PAIN GOAL Description: < 4 with prns Outcome: Progressing   Problem: RH KNOWLEDGE DEFICIT GENERAL Goal: RH STG INCREASE KNOWLEDGE OF SELF CARE AFTER HOSPITALIZATION Description: Patient and family will be able to manage care at discharge using educational resources for medication, skin care and dietary modification independently Outcome: Progressing

## 2023-05-19 NOTE — Plan of Care (Signed)
  Problem: RH Toileting Goal: LTG Patient will perform toileting task (3/3 steps) with assistance level (OT) Description: LTG: Patient will perform toileting task (3/3 steps) with assistance level (OT)  Outcome: Not Met (add Reason) Flowsheets (Taken 05/19/2023 1549) LTG: Pt will perform toileting task (3/3 steps) with assistance level: (not met due to balance and endurance deficits) --   Problem: RH Balance Goal: LTG Patient will maintain dynamic standing with ADLs (OT) Description: LTG:  Patient will maintain dynamic standing balance with assist during activities of daily living (OT)  Outcome: Completed/Met   Problem: Sit to Stand Goal: LTG:  Patient will perform sit to stand in prep for activites of daily living with assistance level (OT) Description: LTG:  Patient will perform sit to stand in prep for activites of daily living with assistance level (OT) Outcome: Completed/Met   Problem: RH Bathing Goal: LTG Patient will bathe all body parts with assist levels (OT) Description: LTG: Patient will bathe all body parts with assist levels (OT) Outcome: Completed/Met   Problem: RH Dressing Goal: LTG Patient will perform lower body dressing w/assist (OT) Description: LTG: Patient will perform lower body dressing with assist, with/without cues in positioning using equipment (OT) Outcome: Completed/Met   Problem: RH Toilet Transfers Goal: LTG Patient will perform toilet transfers w/assist (OT) Description: LTG: Patient will perform toilet transfers with assist, with/without cues using equipment (OT) Outcome: Completed/Met

## 2023-05-19 NOTE — Progress Notes (Addendum)
PROGRESS NOTE   Subjective/Complaints: No new complaints this morning Discussed improved Hgb Discussed extension of 1 day for caregiver training  ROS:  Denies CP, SOB, N/V/D   Objective:   No results found. Recent Labs    05/18/23 0341 05/19/23 0524  WBC 7.5 6.7  HGB 8.1* 8.4*  HCT 27.7* 28.7*  PLT 175 178     Recent Labs    05/19/23 0524  NA 140  K 4.2  CL 107  CO2 25  GLUCOSE 107*  BUN 10  CREATININE 0.89  CALCIUM 8.9      Intake/Output Summary (Last 24 hours) at 05/19/2023 1135 Last data filed at 05/19/2023 1029 Gross per 24 hour  Intake 474 ml  Output --  Net 474 ml         Physical Exam: Vital Signs Blood pressure (!) 175/72, pulse (!) 59, temperature 98.2 F (36.8 C), temperature source Oral, resp. rate 18, height 5\' 4"  (1.626 m), weight 102.7 kg, SpO2 98%.  General: No acute distress Mood and affect are appropriate Heart: Regular rate and rhythm no rubs murmurs or extra sounds Lungs: Clear to auscultation, breathing unlabored, no rales or wheezes Abdomen: Positive bowel sounds, soft nontender to palpation, nondistended Extremities: No clubbing, cyanosis, or edema   Musculoskeletal:        General: Swelling present. Normal range of motion.     Cervical back: Normal range of motion.     Comments: Right leg in splint  Skin: No breakdown noted in the visible portion Neurological:     Mental Status: She is alert.     Comments: Alert and oriented x 3. Normal insight and awareness. Intact Memory. Normal language and speech. Cranial nerve exam unremarkable. MMT: UE 4/5 prox to 4+/5 distal. RLE- 2/5 (pain), able to wiggle toes. LLE 3/5 HF, KE and 4+/5 ADF/PF.  Sensory exam normal for light touch and pain in all 4 limbs. No limb ataxia or cerebellar signs. No abnormal tone appreciated. Stable 2/3 Psychiatric:        Mood and Affect: Mood normal.        Behavior: Behavior normal.       Assessment/Plan: 1. Functional deficits which require 3+ hours per day of interdisciplinary therapy in a comprehensive inpatient rehab setting. Physiatrist is providing close team supervision and 24 hour management of active medical problems listed below. Physiatrist and rehab team continue to assess barriers to discharge/monitor patient progress toward functional and medical goals  Care Tool:  Bathing    Body parts bathed by patient: Right arm, Left arm, Chest, Abdomen, Front perineal area, Buttocks, Right upper leg, Left upper leg, Right lower leg, Left lower leg, Face   Body parts bathed by helper: Left lower leg, Right lower leg     Bathing assist Assist Level: Supervision/Verbal cueing     Upper Body Dressing/Undressing Upper body dressing   What is the patient wearing?: Pull over shirt    Upper body assist Assist Level: Set up assist    Lower Body Dressing/Undressing Lower body dressing      What is the patient wearing?: Underwear/pull up, Pants     Lower body assist Assist for lower body  dressing: Minimal Assistance - Patient > 75%     Toileting Toileting    Toileting assist Assist for toileting: Minimal Assistance - Patient > 75%     Transfers Chair/bed transfer  Transfers assist     Chair/bed transfer assist level: Minimal Assistance - Patient > 75%     Locomotion Ambulation   Ambulation assist      Assist level: Minimal Assistance - Patient > 75% Assistive device: Walker-rolling Max distance: 15   Walk 10 feet activity   Assist  Walk 10 feet activity did not occur: Safety/medical concerns (Per PT Eval)  Assist level: Minimal Assistance - Patient > 75% Assistive device: Walker-rolling   Walk 50 feet activity   Assist Walk 50 feet with 2 turns activity did not occur: Safety/medical concerns         Walk 150 feet activity   Assist Walk 150 feet activity did not occur: Safety/medical concerns         Walk 10 feet on  uneven surface  activity   Assist Walk 10 feet on uneven surfaces activity did not occur: Safety/medical concerns         Wheelchair     Assist Is the patient using a wheelchair?: Yes Type of Wheelchair: Manual    Wheelchair assist level: Supervision/Verbal cueing Max wheelchair distance: 100'    Wheelchair 50 feet with 2 turns activity    Assist        Assist Level: Supervision/Verbal cueing   Wheelchair 150 feet activity     Assist      Assist Level: Supervision/Verbal cueing   Blood pressure (!) 175/72, pulse (!) 59, temperature 98.2 F (36.8 C), temperature source Oral, resp. rate 18, height 5\' 4"  (1.626 m), weight 102.7 kg, SpO2 98%.  Medical Problem List and Plan: 1. Functional deficits secondary to right trimalleolar ankle fx after syncopal episode.              -patient may shower if RLE is covered             -ELOS/Goals: 8-13 days,  mod I to supervision goals with PT and OT    Continue CIR PT and OT  D/c today as insurance has denied appeal of extension 2.  Antithrombotics: SCDs ordered. Bilateral lower extremities (chemo prophy d/c due to epistaxis, anemia)             -antiplatelet therapy: none   3. Pain Management:  -continue Tylenol 1000 mg q 8 hours             -continue oxycodone 10 mg q 4 hours prn  -1/23-1/25 pain controlled, continue current    1/26- pain controlled with meds 4. Chronic depression/Insomnia: LCSW to evaluate and provide emotional support             -continue Celexa 20 mg daily             -trazodone 50 mg q HS prn sleep             -pt was on at bedtime remeron and xanax prn at home (not resumed)             -antipsychotic agents: n/a   5. Neuropsych/cognition: This patient is capable of making decisions on her own behalf.   6. Skin/Wound Care: Routine skin care checks   7. Fluids/Electrolytes/Nutrition: Routine Is and Os and follow-up chemistries   8: Hypertension: monitor TID and prn              -  continue amlodipine 10 mg daily             -increase lisinopril to 10mg  daily  -1/21 fair control continue to monitor trend for now  1/25- BP ocntrolled- Cards increased hydralazine to 10 mg TID when stopped Norvasc  Was hypotensive: decrease lisinopril to 5mg  daily    05/19/2023    5:13 AM 05/18/2023    7:08 PM 05/18/2023    1:12 PM  Vitals with BMI  Systolic 175 139 621  Diastolic 72 53 75  Pulse 59 57 60  BP creeping up increased lisinopril to 10mg  yesterday , monitor   Add back hydralazine 10mg  TID   9: Hyperlipidemia: continue statin   10: GERD: continue PPI   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 Pt [facility]             -follow-up CBC             -goal to keep hgb >8  -h/h stable   1/25- Hb 7.7- is overall a little less, but was running 8.1 or so- will monitor 14: Intermittent epistaxis due to #11: - Afrin, other measures as needed             -consider ENT consultation for hemostasis              15: Right ankle fracture s/p ORIF 1/09, Dr. Hulda Humphrey (has nylon sutures and short leg splint with posterior slab and stirrups)             -NWB for 8-10 weeks             -follow-up ortho in 2-3 weeks post-op Pain appears to be under good control 16: Tobacco use: cessation counseling   17: Code status: DNR-limited    19: DM-2: A1c = 5.0% October 2024 (glucoses ~100-120s on regular diet)             -home metformin 500 mg BID not restarted-  f/u PCP         20: Thrombocytosis: resolved   21: New onset nausea:          resolved  22. Diastolic hypotension: resolved  23. Obesity: provide dietary education  24. Constipation: BM 1/28, continue senna 1 tab HS   25. HTN: continue lisinopril   26. Hypotension: resolved, d/c hydralazine  27. AKI  Cr reviewed and resolved  D/c hydralazine  28. Bradycardia: d/c hydralazine  29. Iron deficiency anemia: Venofer ordered, discussed that this is a weekly injection, will give daily dose for 2 days  20. Anxiety: prn ativan ordered,decrease to q8H prn  21. Left knee pain: continue prn hot packs, voltaren gel ordered   >  30 minutes spent in discharge of patient including review of medications and follow-up appointments, physical examination, and in answering all patient's questions   LOS: 17 days A FACE TO FACE EVALUATION WAS PERFORMED  Clint Bolder P Demarr Kluever 05/19/2023, 11:35 AM

## 2023-05-19 NOTE — Progress Notes (Signed)
Inpatient Rehabilitation Discharge Medication Review by a Pharmacist  A complete drug regimen review was completed for this patient to identify any potential clinically significant medication issues.  High Risk Drug Classes Is patient taking? Indication by Medication  Antipsychotic No   Anticoagulant No   Antibiotic No   Opioid Yes Oxycodone for acute pain   Antiplatelet No   Hypoglycemics/insulin No   Vasoactive Medication Yes Lisinopril for HTN   Chemotherapy No   Other Yes Acetaminophen for mild pain Alprazolam and trazodone for sleep Aminocaproic acid for hereditary hemorrhagic telangiectasia Citalopram for depression Nicotine for tobacco cessation  Pantoprazole for GERD  Albuterol for shortness of breath Senna-doc for constipation     Type of Medication Issue Identified Description of Issue Recommendation(s)  Drug Interaction(s) (clinically significant)     Duplicate Therapy     Allergy     No Medication Administration End Date     Incorrect Dose     Additional Drug Therapy Needed     Significant med changes from prior encounter (inform family/care partners about these prior to discharge). NEW trazodone STOP amlodipine, metformin, mitrazapine   Other  Team added hydralazine 10mg  TID with half home lisinopril dose. Suggested increasing back to home lisinopril 20mg  and not starting hydralazine given difficult adherence and low doses of both medications.  MD agreed with stopping hydralazine and increasing lisinopril back to home 20mg      Clinically significant medication issues were identified that warrant physician communication and completion of prescribed/recommended actions by midnight of the next day:  Yes  Name of provider notified for urgent issues identified: Dr. Carlis Abbott, Wendi Maya, PA  Provider Method of Notification: secure chat     Pharmacist comments: Clinically significant medication issue with BP meds was resolved per above. Orders  entered.    Time spent performing this drug regimen review (minutes):  20  Alphia Moh, PharmD, [place], [facility] Clinical Pharmacist  Please check AMION for all [facility] phone numbers After 10:00 PM, call Main Pharmacy [phone]

## 2023-05-19 NOTE — Progress Notes (Addendum)
Patient ID: Peggy House, female   DOB: [birth year], 63 y.o.   MRN: [id number]  Per medical team, pt d/c date extended to 05/21/23 to allow for family education.   1042- SW left message for pt son Clide Cliff informing on change in D/c date and to request assistance with family education. SW waiting on follow-up.   SW received updates from insurance reporting pt current stay 2/1-2/3 is currently denied. Attending will complete expedited appeal.   1310- SW received updates from insurance declining appeal. Attending left message for peer to peer reconsideration.   SW discussed above with medical team. All aware pt will be released.   SW met with pt in room to discuss above denial. Pt called her son Clide Cliff and SW explained above. He reports he has to go to work, and could only come at the time of the phone call due to his work schedule. Asked to call his other brother [place]. Pt called cousin Bradly Bienenstock to discuss above. Reports she will call back to inform on plan as she is aware pt has to be picked up today.  *Attending reports earliest they can do peer to peer is tomorrow at 9:30. Will work on getting days 2/1-2/3 covered.   SW informed [facility] informing on change in patient discharge date.   *pt cousin is in the room and pt will leave after Iron IV drip. SW reviewed discharge.   Cecile Sheerer, MSW, LCSW Office: [phone] Cell: [phone] Fax: [phone]

## 2023-05-20 NOTE — Progress Notes (Signed)
Physical Therapy Discharge Summary  Patient Details  Name: Peggy House MRN: [id number] Date of Birth: 08/17/1960  Date of Discharge from PT service:May 19, 2023  Patient has met 8 of 9 long term goals due to improved activity tolerance, improved balance, improved postural control, increased strength, increased range of motion, ability to compensate for deficits, improved awareness, and improved coordination.  Patient to discharge at a wheelchair level  CGA/min A . Patient's care partner is independent to provide the necessary physical and cognitive assistance at discharge. Pt's sister plans on staying with pt initially to provide 24/7 assist upon discharge.   Reasons goals not met: Pt did not meet ambulation goal of 78ft as pt is only able to ambulate up to 31ft with RW and CGA due to global weakness/deconditioning.   Recommendation:  Patient will benefit from ongoing skilled PT services in home health setting to continue to advance safe functional mobility, address ongoing impairments in transfers, generalized strengthening and endurance, dynamic standing balance/coordination, and to minimize fall risk.  Equipment: 20x16 manual WC with bilateral elevating legrests; pt already has RW   Reasons for discharge: treatment goals met and discharge from hospital  Patient/family agrees with progress made and goals achieved: Yes  PT Discharge Precautions/Restrictions Precautions Precautions: Fall Precaution Comments: RLE NWB Required Braces or Orthoses: Other Brace Other Brace: RLE CAM boot Restrictions Weight Bearing Restrictions Per Provider Order: Yes RLE Weight Bearing Per Provider Order: Non weight bearing Pain Interference Pain Interference Pain Effect on Sleep: 3. Frequently Pain Interference with Therapy Activities: 2. Occasionally Pain Interference with Day-to-Day Activities: 8. Unable to answer Cognition Overall Cognitive Status: History of cognitive impairments -  at baseline Arousal/Alertness: Awake/alert Orientation Level: Oriented X4 Memory: Impaired Awareness: Appears intact Problem Solving: Impaired Safety/Judgment: Appears intact Sensation Sensation Light Touch: Appears Intact Hot/Cold: Appears Intact Proprioception: Appears Intact Stereognosis: Appears Intact Coordination Gross Motor Movements are Fluid and Coordinated: No Fine Motor Movements are Fluid and Coordinated: Yes Coordination and Movement Description: Limited by pain, generalized weakness, body habitus, NWB precautions, arthritis, and R ankle fx Motor  Motor Motor: Other (comment) Motor - Skilled Clinical Observations: Generalized weakness and deconditioning with alreted balance strategies s/p ORIF R ankle  Mobility Bed Mobility Bed Mobility: Rolling Right;Rolling Left;Supine to Sit;Sit to Supine Rolling Right: Independent with assistive device Rolling Left: Independent with assistive device Supine to Sit: Independent with assistive device Sit to Supine: Independent with assistive device Transfers Transfers: Sit to Stand;Stand to Sit;Stand Pivot Transfers Sit to Stand: Contact Guard/Touching assist (from elevated surface otherwise min A) Stand to Sit: Contact Guard/Touching assist Stand Pivot Transfers: Contact Guard/Touching assist Stand Pivot Transfer Details: Verbal cues for technique;Verbal cues for gait pattern;Verbal cues for precautions/safety;Verbal cues for safe use of DME/AE Transfer (Assistive device): Rolling walker Locomotion  Gait Ambulation: Yes Gait Assistance: Contact Guard/Touching assist Gait Distance (Feet): 15 Feet Assistive device: Rolling walker Gait Assistance Details: Verbal cues for technique;Verbal cues for gait pattern;Verbal cues for safe use of DME/AE;Verbal cues for precautions/safety Gait Gait: Yes Gait Pattern: Impaired ("hop to") Gait velocity: decreased Stairs / Additional Locomotion Stairs: No Wheelchair Mobility Wheelchair  Mobility: Yes Wheelchair Assistance: Independent with Scientist, research (life sciences): Both upper extremities Wheelchair Parts Management: Needs assistance Distance: 152ft  Trunk/Postural Assessment  Cervical Assessment Cervical Assessment: Within Functional Limits Thoracic Assessment Thoracic Assessment: Within Functional Limits Lumbar Assessment Lumbar Assessment: Exceptions to [facility] (posterior pelvic tilt) Postural Control Postural Control: Deficits on evaluation  Balance Balance Balance Assessed: Yes Static Sitting Balance  Static Sitting - Balance Support: Feet supported;No upper extremity supported Static Sitting - Level of Assistance: 7: Independent Dynamic Sitting Balance Dynamic Sitting - Balance Support: Feet supported;No upper extremity supported Dynamic Sitting - Level of Assistance: 5: Stand by assistance (supervision) Static Standing Balance Static Standing - Balance Support: During functional activity;Bilateral upper extremity supported (RW) Dynamic Standing Balance Dynamic Standing - Balance Support: During functional activity;Bilateral upper extremity supported (RW) Dynamic Standing - Level of Assistance: 5: Stand by assistance (CGA) Dynamic Standing - Comments: transfers and gait Extremity Assessment  RLE Assessment RLE Assessment: Exceptions to [facility] General Strength Comments: Grossly 4/5 (unable to assess ankle 2/2 splint) LLE Assessment LLE Assessment: Exceptions to [facility] General Strength Comments: Grossly 4+/5  Marlana Salvage Zaunegger Blima Rich PT, DPT 05/20/2023, 12:07 PM

## 2023-05-21 ENCOUNTER — Telehealth: Payer: Self-pay

## 2023-05-21 NOTE — Telephone Encounter (Signed)
 Transition Care Management Unsuccessful Follow-up Telephone Call  Date of discharge and from where:  05/19/2023 [facility]  Attempts:  1st Attempt  Reason for unsuccessful TCM follow-up call:  Left voice message

## 2023-05-26 ENCOUNTER — Encounter
Payer: Medicaid Other | Attending: Physical Medicine and Rehabilitation | Admitting: Physical Medicine and Rehabilitation

## 2023-05-27 ENCOUNTER — Encounter: Payer: Self-pay | Admitting: Student

## 2023-05-27 ENCOUNTER — Ambulatory Visit: Payer: Medicaid Other | Admitting: Student

## 2023-05-27 VITALS — BP 188/74 | HR 56 | Temp 97.9°F | Ht 64.0 in | Wt 235.0 lb

## 2023-05-27 DIAGNOSIS — S82891D Other fracture of right lower leg, subsequent encounter for closed fracture with routine healing: Secondary | ICD-10-CM

## 2023-05-27 DIAGNOSIS — D649 Anemia, unspecified: Secondary | ICD-10-CM

## 2023-05-27 DIAGNOSIS — S82851D Displaced trimalleolar fracture of right lower leg, subsequent encounter for closed fracture with routine healing: Secondary | ICD-10-CM

## 2023-05-27 DIAGNOSIS — I10 Essential (primary) hypertension: Secondary | ICD-10-CM | POA: Diagnosis not present

## 2023-05-27 DIAGNOSIS — R040 Epistaxis: Secondary | ICD-10-CM | POA: Diagnosis not present

## 2023-05-27 MED ORDER — AMLODIPINE BESYLATE 5 MG PO TABS
5.0000 mg | ORAL_TABLET | Freq: Every day | ORAL | 11 refills | Status: DC
Start: 2023-05-27 — End: 2023-06-10

## 2023-05-27 MED ORDER — LISINOPRIL 20 MG PO TABS: 20.0000 mg | ORAL_TABLET | Freq: Every day | ORAL | 0 refills | Status: DC

## 2023-05-27 NOTE — Assessment & Plan Note (Addendum)
Her blood pressure was significantly elevated at 178/69, followed by 188/74 and repeat.  She denies any current symptoms including headache, dizziness, vision changes, or shortness of breath.  She states that she has not taken her lisinopril this morning because she ran out.  She was previously on lisinopril 20 mg and amlodipine 10 mg, however her amlodipine was discontinued during her hospitalization.  She states that she has been on those medications for years and did not have any difficulties with them, however I am unsure why it was discontinued during her hospitalization.  Given the severity of her hypertension, she will need multiple agents for control, so I am represcribing amlodipine at a lower dose, 5 mg, and will follow-up closely in 2 weeks to recheck her blood pressure and to ensure she is tolerating this well.  I have sent a refill for her lisinopril, which we will continue at 20 mg daily.

## 2023-05-27 NOTE — Patient Instructions (Signed)
Thank you, Peggy House for allowing [place] to provide your care today. Today we discussed your high blood pressure, recent hospital stay, right ankle fracture.  As we discussed, it is really important for you to make these follow-up appointments with the specialists.  Their numbers are listed in your discharge summary.  Please call the orthopedic surgeon and Physical Medicine and Rehabilitation physician to follow-up on your ankle fracture.  They will also be able to help you manage your pain and your brace.  Please try to wear your brace as much as you are able to.   I also recommend calling your hematologist to get back on your scheduled IV iron infusions every week.  With your blood loss from your nosebleeds, it is important for you to get weekly iron and monitoring of your blood levels.   Your blood pressure was significantly elevated today.  I have sent in a refill for your lisinopril, which you should take every day.  I have also sent in a prescription for amlodipine, which you should also take every day.  We will follow-up on this at your next appointment.  Please be sure to take these medications on the day of your appointment so that we can get an accurate blood pressure measurement on the medications.  We are checking your complete blood count today.  I will call you as soon as I have the results.  I have ordered the following labs for you:   Lab Orders         CBC no Diff      Referrals ordered today:    Referral Orders         Ambulatory referral to Occupational Therapy      I have ordered the following medication/changed the following medications:   Start the following medications: Meds ordered this encounter  Medications   lisinopril (ZESTRIL) 20 MG tablet    Sig: Take 1 tablet (20 mg total) by mouth daily.    Dispense:  30 tablet    Refill:  0   amLODipine (NORVASC) 5 MG tablet    Sig: Take 1 tablet (5 mg total) by mouth daily.    Dispense:  30 tablet     Refill:  11     Follow up: 2 weeks  We look forward to seeing you next time. Please call our clinic at [phone] if you have any questions or concerns. The best time to call is Monday-Friday from 9am-4pm, but there is someone available 24/7. If after hours or the weekend, call the main hospital number and ask for the Internal Medicine Resident On-Call. If you need medication refills, please notify your pharmacy one week in advance and they will send [place] a request.   Thank you for trusting me with your care. Wishing you the best!   Annett Fabian, MD [facility]

## 2023-05-27 NOTE — Assessment & Plan Note (Addendum)
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  She is previously followed by hematology receiving weekly IV iron infusions for this, but has not had any since her discharge from the hospital recently.  She has had 1 nosebleed in the past week which lasted around 30 minutes and resolved on its own.  Her previous baseline was around 3 nosebleeds per week. She denies any current symptoms of anemia including lightheadedness, dizziness, or shortness of breath.  I am rechecking a CBC today and I have encouraged her to follow-up with her hematologist to get back on her iron infusion schedule.

## 2023-05-27 NOTE — Assessment & Plan Note (Addendum)
She had a right trimalleolar ankle fracture and is now s/p closed reduction and ORIF with orthopedic surgery.  She spent several weeks in rehab and was discharged on 2/3.  She is not currently wearing her brace as instructed by Ortho, so I have encouraged her to wear it as much as possible as it is important for her recovery.  She was supposed to have an appointment with her PM & R physician yesterday, however she missed this appointment.  I gave her the number to reschedule this appointment and she states that she will do so.  She apparently also was supposed have an occupational therapist come to her home, but this has not happened so I have placed another referral.  I have scheduled close follow-up in 2 weeks in our clinic and we will ensure that she schedules all of her specialty appointments.

## 2023-05-27 NOTE — Progress Notes (Signed)
CC: hospital f/u  HPI:  Peggy House is a 63 y.o. female living with a history stated below and presents today for hospital f/u. Please see problem based assessment and plan for additional details.  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 Use to check blood sugar before breakfast and before dinner while on steroids) 100 each 1   acetaminophen (TYLENOL) 325 MG tablet Take 2 tablets (650 mg total) by mouth every 6 (six) hours as needed. 100 tablet 0   ALPRAZolam (XANAX) 0.5 MG tablet TAKE 1 TABLET BY MOUTH AT BEDTIME AS NEEDED FOR ANXIETY OR SLEEP 7 tablet 0   aminocaproic acid (AMICAR) 500 MG tablet Take 2 tablets (1,000 mg total) by mouth every 12 (twelve) hours. 120 tablet 3   citalopram (CELEXA) 20 MG tablet Take 1 tablet (20 mg total) by mouth daily. 90 tablet 3   glucose blood (ACCU-CHEK GUIDE) test strip Check blood sugar 2 times per day while on steroids before breakfast and dinner (Patient taking differently: 1 each by Other route See admin instructions. Check blood sugar 2 times per day while on steroids before breakfast and dinner) 50 each 3   nicotine (NICODERM CQ - DOSED IN MG/24 HOURS) 14 mg/24hr patch Place 1 patch (14 mg total) onto the skin daily. (Patient taking differently: Place 14 mg onto the skin daily.) 30 patch 3   oxyCODONE (OXY  IR/ROXICODONE) 5 MG immediate release tablet Take 1-2 tablets (5-10 mg total) by mouth every 4 (four) hours as needed for severe pain (pain score 7-10) (Try oral before IV medicine.). 20 tablet 0   pantoprazole (PROTONIX) 40 MG tablet TAKE 1 TABLET(40 MG) BY MOUTH TWICE DAILY (Patient taking differently: Take 40 mg by mouth daily.) 180 tablet 1   PROAIR HFA 108 (90 Base) MCG/ACT inhaler INHALE 1 TO 2 PUFFS INTO THE LUNGS EVERY 6 HOURS AS NEEDED FOR WHEEZING OR SHORTNESS OF BREATH (Patient taking differently: Inhale 1-2 puffs into the lungs every 6 (six) hours as needed for wheezing or shortness of breath.) 8.5 g 0   senna-docusate (SENOKOT-S) 8.6-50 MG tablet Take 1 tablet by mouth at bedtime. 100 tablet 0   traZODone (DESYREL) 50 MG tablet Take 1 tablet (50 mg total) by mouth at bedtime as needed for sleep (if). 30 tablet 0   Current Facility-Administered Medications on File Prior to Visit  Medication Dose Route Frequency Provider Last Rate Last Admin   diphenhydrAMINE (BENADRYL) 25 mg capsule            diphenhydrAMINE (BENADRYL) 25 mg capsule            phenylephrine (NEO-SYNEPHRINE) 1 % nasal drops 2 drop  2 drop Left Nare Once Malachy Mood, MD        Family History  Problem Relation Age of Onset   Cancer Mother  Ovarian   Ovarian cancer Mother    Diabetes Mother    Kidney disease Mother    Bleeding Disorder Mother    Diabetes type II Sister    Bleeding Disorder Sister    Diabetes Sister    Colon cancer Maternal Grandfather    Crohn's disease Maternal Grandfather    Stomach cancer Maternal Grandmother    Kidney disease Son    Bleeding Disorder Son    Dysmenorrhea Neg Hx     Social History   Socioeconomic History   Marital status: Widowed    Spouse name: Not on file   Number of children: 3   Years of education: 39   Highest education level: Not on file  Occupational History   Not on file  Tobacco Use   Smoking status: Every Day    Current packs/day: 0.50    Average  packs/day: 0.5 packs/day for 30.0 years (15.0 ttl pk-yrs)    Types: Cigarettes   Smokeless tobacco: Former    Types: Snuff    Quit date: 61   Tobacco comments:    1/2 PPD  Vaping Use   Vaping status: Never Used  Substance and Sexual Activity   Alcohol use: No    Alcohol/week: 0.0 standard drinks of alcohol   Drug use: No    Comment: last cocaine-2010   Sexual activity: Not Currently    Comment: Hysterectomy  Other Topics Concern   Not on file  Social History Narrative   Not on file   Social Drivers of Health   Financial Resource Strain: Not on file  Food Insecurity: No Food Insecurity (04/13/2023)   Hunger Vital Sign    Worried About Running Out of Food in the Last Year: Never true    Ran Out of Food in the Last Year: Never true  Transportation Needs: No Transportation Needs (04/13/2023)   PRAPARE - Administrator, Civil Service (Medical): No    Lack of Transportation (Non-Medical): No  Recent Concern: Transportation Needs - Unmet Transportation Needs (01/31/2023)   PRAPARE - Administrator, Civil Service (Medical): Yes    Lack of Transportation (Non-Medical): Yes  Physical Activity: Not on file  Stress: Not on file  Social Connections: Not on file  Intimate Partner Violence: Not At Risk (04/13/2023)   Humiliation, Afraid, Rape, and Kick questionnaire    Fear of Current or Ex-Partner: No    Emotionally Abused: No    Physically Abused: No    Sexually Abused: No   Review of Systems: ROS negative except for what is noted on the assessment and plan.  Vitals:   05/27/23 1034 05/27/23 1112 05/27/23 1138  BP: (!) 176/69 (!) 178/69 (!) 188/74  Pulse: (!) 56 (!) 58 (!) 56  Temp: 97.9 F (36.6 C)    TempSrc: Oral    SpO2: 100%    Weight: 235 lb (106.6 kg)    Height: 5\' 4"  (1.626 m)     Physical Exam: Constitutional: Overweight woman, sitting in a wheelchair with right leg propped up; family member is present Cardiovascular: Mild bradycardia,  no murmurs; 1+ lower extremity edema bilaterally, distal pulses palpable Pulmonary/Chest: normal work of breathing on room air, lungs clear to auscultation bilaterally; respiratory rate Abdominal: soft, non-tender, non-distended MSK: normal bulk and tone; tenderness to light palpation over the right ankle. No significant focal swelling or erythema.  Neurological: alert & oriented x 3, no focal deficits Skin: warm and dry Psych: normal mood and behavior  Assessment & Plan:   Patient discussed with Dr. Deirdre Priest  She was admitted to the hospital on 12/29 through 1/17 and then was discharged to rehab from 1/17 to 2/3 for acute symptomatic anemia and a right trimalleolar fracture.  Recurrent epistaxis She has a long history of recurrent epistaxis in the setting of her hereditary hemorrhagic telangiectasia.  She is previously followed by hematology receiving weekly IV iron infusions for this, but has not had any since her discharge from the hospital recently.  She has had 1 nosebleed in the past week which lasted around 30 minutes and resolved on its own.  Her previous baseline was around 3 nosebleeds per week. She denies any current symptoms of anemia including lightheadedness, dizziness, or shortness of breath.  I am rechecking a CBC today and I have encouraged her to follow-up with her hematologist to get back on her iron infusion schedule.   Closed right trimalleolar fracture She had a right trimalleolar ankle fracture and is now s/p closed reduction and ORIF with orthopedic surgery.  She spent several weeks in rehab and was discharged on 2/3.  She is not currently wearing her brace as instructed by Ortho, so I have encouraged her to wear it as much as possible as it is important for her recovery.  She was supposed to have an appointment with her PM & R physician yesterday, however she missed this appointment.  I gave her the number to reschedule this appointment and she states that she will do so.   She apparently also was supposed have an occupational therapist come to her home, but this has not happened so I have placed another referral.  I have scheduled close follow-up in 2 weeks in our clinic and we will ensure that she schedules all of her specialty appointments.  Essential hypertension Her blood pressure was significantly elevated at 178/69, followed by 188/74 and repeat.  She denies any current symptoms including headache, dizziness, vision changes, or shortness of breath.  She states that she has not taken her lisinopril this morning because she ran out.  She was previously on lisinopril 20 mg and amlodipine 10 mg, however her amlodipine was discontinued during her hospitalization.  She states that she has been on those medications for years and did not have any difficulties with them, however I am unsure why it was discontinued during her hospitalization.  Given the severity of her hypertension, she will need multiple agents for control, so I am represcribing amlodipine at a lower dose, 5 mg, and will follow-up closely in 2 weeks to recheck her blood pressure and to ensure she is tolerating this well.  I have sent a refill for her lisinopril, which we will continue at 20 mg daily.   Annett Fabian, MD [facility] Internal Medicine, PGY-1 Phone: [phone] Date 05/27/2023 Time 1:15 PM

## 2023-05-28 ENCOUNTER — Other Ambulatory Visit: Payer: Self-pay | Admitting: Student

## 2023-05-28 LAB — CBC
Hematocrit: 32.9 % — ABNORMAL LOW (ref 34.0–46.6)
Hemoglobin: 9.7 g/dL — ABNORMAL LOW (ref 11.1–15.9)
MCH: 25.7 pg — ABNORMAL LOW (ref 26.6–33.0)
MCHC: 29.5 g/dL — ABNORMAL LOW (ref 31.5–35.7)
MCV: 87 fL (ref 79–97)
Platelets: 356 10*3/uL (ref 150–450)
RBC: 3.78 x10E6/uL (ref 3.77–5.28)
RDW: 18.2 % — ABNORMAL HIGH (ref 11.7–15.4)
WBC: 10.4 10*3/uL (ref 3.4–10.8)

## 2023-05-28 NOTE — Telephone Encounter (Signed)
Pt was seen yesterday 05/28/2023 and was unable to pick up the following medication  ALPRAZolam (XANAX) 0.5 MG tablet   [id number] - [place], [place] - [address]

## 2023-05-28 NOTE — Progress Notes (Signed)
Internal Medicine Clinic Attending  Case discussed with the resident at the time of the visit.  We reviewed the resident's history and exam and pertinent patient test results.  I agree with the assessment, diagnosis, and plan of care documented in the resident's note.

## 2023-05-29 ENCOUNTER — Other Ambulatory Visit: Payer: Self-pay | Admitting: Student

## 2023-05-29 DIAGNOSIS — Z1231 Encounter for screening mammogram for malignant neoplasm of breast: Secondary | ICD-10-CM

## 2023-06-03 ENCOUNTER — Other Ambulatory Visit: Payer: Self-pay | Admitting: Student

## 2023-06-03 DIAGNOSIS — S82891D Other fracture of right lower leg, subsequent encounter for closed fracture with routine healing: Secondary | ICD-10-CM

## 2023-06-10 ENCOUNTER — Encounter: Payer: Self-pay | Admitting: Student

## 2023-06-10 ENCOUNTER — Other Ambulatory Visit: Payer: Self-pay

## 2023-06-10 ENCOUNTER — Ambulatory Visit: Payer: Medicaid Other | Admitting: Student

## 2023-06-10 VITALS — BP 191/81 | HR 48 | Temp 98.1°F | Ht 64.0 in | Wt 234.5 lb

## 2023-06-10 DIAGNOSIS — I10 Essential (primary) hypertension: Secondary | ICD-10-CM | POA: Diagnosis not present

## 2023-06-10 DIAGNOSIS — S82851D Displaced trimalleolar fracture of right lower leg, subsequent encounter for closed fracture with routine healing: Secondary | ICD-10-CM

## 2023-06-10 DIAGNOSIS — R040 Epistaxis: Secondary | ICD-10-CM | POA: Diagnosis not present

## 2023-06-10 DIAGNOSIS — M150 Primary generalized (osteo)arthritis: Secondary | ICD-10-CM | POA: Diagnosis not present

## 2023-06-10 MED ORDER — DICLOFENAC SODIUM 1 % EX GEL: 4.0000 g | Freq: Four times a day (QID) | CUTANEOUS | 1 refills | Status: DC

## 2023-06-10 MED ORDER — AMLODIPINE BESYLATE 10 MG PO TABS: 10.0000 mg | ORAL_TABLET | Freq: Every day | ORAL | 11 refills | Status: AC

## 2023-06-10 NOTE — Assessment & Plan Note (Signed)
 She reports only 1 episode of epistaxis since our last visit 2 weeks ago.  She states this was a mild episode.  She denies any symptoms of anemia including lightheadedness, dizziness, shortness of breath.  She has a follow-up appointment scheduled with hematology in March.

## 2023-06-10 NOTE — Assessment & Plan Note (Signed)
 She continues to have some pain from her right ankle fracture s/p surgical repair.  She has not heard back from physical therapy, so I have replaced a referral for her so that she can schedule her sessions.  She has a follow-up appoint with the rehab doctor in March.  She is wearing her boot today.  I recommended she also call her orthopedic surgeon to schedule a follow-up appointment.

## 2023-06-10 NOTE — Patient Instructions (Signed)
 Thank you, Peggy House for allowing [place] to provide your care today.  Your blood pressure was still very high today at 191/81.  I have increased your amlodipine to 10 mg a day, which you were on previously.  You may take two 5 mg tablets daily until you run out, then you can pick up the 10 mg tablets from the pharmacy.   Please continue taking your lisinopril 20 mg daily.  I have sent in a prescription of Voltaren gel which you can use for your joint pain.  I am really glad that you have scheduled all the follow-up appointments with your hematologist, cardiologist, and physical rehab doctor.  I have placed a referral to physical therapy.  They should be contacting you shortly.  Referrals ordered today:    Referral Orders         Ambulatory referral to Physical Therapy      I have ordered the following medication/changed the following medications:   Start the following medications: Meds ordered this encounter  Medications   diclofenac Sodium (VOLTAREN ARTHRITIS PAIN) 1 % GEL    Sig: Apply 4 g topically 4 (four) times daily.    Dispense:  100 g    Refill:  1   amLODipine (NORVASC) 10 MG tablet    Sig: Take 1 tablet (10 mg total) by mouth daily.    Dispense:  30 tablet    Refill:  11     Follow up: 4 weeks  We look forward to seeing you next time. Please call our clinic at [phone] if you have any questions or concerns. The best time to call is Monday-Friday from 9am-4pm, but there is someone available 24/7. If after hours or the weekend, call the main hospital number and ask for the Internal Medicine Resident On-Call. If you need medication refills, please notify your pharmacy one week in advance and they will send [place] a request.   Thank you for trusting me with your care. Wishing you the best!   Annett Fabian, MD [facility]

## 2023-06-10 NOTE — Assessment & Plan Note (Addendum)
 Blood pressure is extremely elevated again in clinic today at 191/81.  She is supposed to be taking lisinopril 20 mg and amlodipine 5 mg.  She did not take her medications today.  She is asymptomatic currently.  She seems unsure about what medications she is taking, so I have instructed her and her daughter to bring their medications to the next visit so we can review them.    I have increased her amlodipine to 10 mg/day, in addition to her lisinopril 20 mg/day.  We will follow-up in 4 weeks to recheck her blood pressure.

## 2023-06-10 NOTE — Progress Notes (Signed)
    CC: Routine Follow Up for HTN after last office visit 05/27/2023  HPI:  Peggy House is a 63 y.o. female with pertinent PMH of HTN, HH telangiectasia, recent right ankle fx who presents to the clinic for HTN follow up. Please see assessment and plan below for further details.  Review of Systems:   Pertinent items noted in HPI and/or A&P.  Physical Exam:  Vitals:   06/10/23 0944 06/10/23 0950  BP: (!) 197/69 (!) 191/81  Pulse: (!) 54 (!) 48  Temp: 98.1 F (36.7 C)   TempSrc: Oral   SpO2: 100%   Weight: 234 lb 8 oz (106.4 kg)   Height: 5\' 4"  (1.626 m)    Middle-aged female, obese body habitus, right ankle boot on, in a wheelchair Daughter is bedside with her Heart regular rate and rhythm Lungs clear to auscultation bilaterally, normal respiratory rate and effort No focal neurological deficits  Assessment & Plan:   Essential hypertension Blood pressure is extremely elevated again in clinic today at 191/81.  She is supposed to be taking lisinopril 20 mg and amlodipine 5 mg.  She did not take her medications today.  She is asymptomatic currently.  She seems unsure about what medications she is taking, so I have instructed her and her daughter to bring their medications to the next visit so we can review them.    I have increased her amlodipine to 10 mg/day, in addition to her lisinopril 20 mg/day.  We will follow-up in 4 weeks to recheck her blood pressure.  Osteoarthritis, multiple sites She continues to have osteoarthritis in multiple joints.  I have sent in a prescription for Voltaren gel.  Recurrent epistaxis She reports only 1 episode of epistaxis since our last visit 2 weeks ago.  She states this was a mild episode.  She denies any symptoms of anemia including lightheadedness, dizziness, shortness of breath.  She has a follow-up appointment scheduled with hematology in March.  Closed right trimalleolar fracture She continues to have some pain from her right  ankle fracture s/p surgical repair.  She has not heard back from physical therapy, so I have replaced a referral for her so that she can schedule her sessions.  She has a follow-up appoint with the rehab doctor in March.  She is wearing her boot today.  I recommended she also call her orthopedic surgeon to schedule a follow-up appointment.   Patient discussed with Dr. Carleene Overlie, MD [facility] Internal Medicine Resident PGY-1 Clinic Phone: [phone] Pager: [phone]

## 2023-06-10 NOTE — Assessment & Plan Note (Signed)
 She continues to have osteoarthritis in multiple joints.  I have sent in a prescription for Voltaren gel.

## 2023-06-10 NOTE — Progress Notes (Signed)
 Internal Medicine Clinic Attending  Case discussed with the resident at the time of the visit.  We reviewed the resident's history and exam and pertinent patient test results.  I agree with the assessment, diagnosis, and plan of care documented in the resident's note.

## 2023-06-11 ENCOUNTER — Telehealth: Payer: Self-pay | Admitting: *Deleted

## 2023-06-11 NOTE — Telephone Encounter (Signed)
 Spoke with patient regarding her mammogram appointment ; July 30, 2023 @ 9:00 am arrive 8:45 am / breast center [phone]) patient to contact their office if unable to keep the appointment failure to do so will be a 75.00 no show fee charged.

## 2023-06-13 DIAGNOSIS — S82899A Other fracture of unspecified lower leg, initial encounter for closed fracture: Secondary | ICD-10-CM | POA: Diagnosis not present

## 2023-06-17 ENCOUNTER — Other Ambulatory Visit: Payer: Self-pay | Admitting: Student

## 2023-06-17 NOTE — Telephone Encounter (Signed)
 ALPRAZolam (XANAX) 0.5 MG tablet   [id number] - [place], [place] - [address]

## 2023-06-18 NOTE — Telephone Encounter (Signed)
 Medication prescribed by different provider, not imc

## 2023-06-20 ENCOUNTER — Other Ambulatory Visit: Payer: Self-pay

## 2023-06-20 ENCOUNTER — Other Ambulatory Visit (HOSPITAL_COMMUNITY): Payer: Self-pay

## 2023-06-20 ENCOUNTER — Other Ambulatory Visit: Payer: Self-pay | Admitting: Physical Medicine and Rehabilitation

## 2023-06-20 ENCOUNTER — Other Ambulatory Visit: Payer: Self-pay | Admitting: Student

## 2023-06-23 ENCOUNTER — Other Ambulatory Visit: Payer: Self-pay | Admitting: Nurse Practitioner

## 2023-06-23 ENCOUNTER — Other Ambulatory Visit: Payer: Self-pay

## 2023-06-23 DIAGNOSIS — D500 Iron deficiency anemia secondary to blood loss (chronic): Secondary | ICD-10-CM

## 2023-06-23 MED ORDER — LISINOPRIL 20 MG PO TABS
20.0000 mg | ORAL_TABLET | Freq: Every day | ORAL | 3 refills | Status: AC
  Filled 2023-06-23: qty 30, 30d supply, fill #0
  Filled 2023-10-09: qty 30, 30d supply, fill #1

## 2023-06-23 NOTE — Telephone Encounter (Signed)
 Medication sent to pharmacy

## 2023-06-23 NOTE — Assessment & Plan Note (Addendum)
 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 -she has required multiple blood transfusions and APCs since 2019. 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 -She had multiple prolonged hospital stay due to severe GI bleeding.   -s/p IR embolization of gastric artery branch vessels on 12/10/17, cauterization of stomach for severe GI bleed in 11/2018, and epinephrine injection for bleeding ulcer on 11/03/20. -She has required frequent blood transfusion, IV iron and bevacizumab -She does respond to treatment, anemia improved with IV iron and bevacizumab, but she is not compliant with her appointments. -Has not had treatment with bevacizumab due to recent hospitalization for ankle fracture and severe anemia. -Restart bevacizumab and IV iron every 2 weeks.  Patient and her sister both understand importance of attending all scheduled appointments.

## 2023-06-23 NOTE — Progress Notes (Signed)
 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 -she has required multiple blood transfusions and APCs since 2019. 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 -She had multiple prolonged hospital stay due to severe GI bleeding.   -s/p IR embolization of gastric artery branch vessels on 12/10/17, cauterization of stomach for severe GI bleed in 11/2018, and epinephrine injection for bleeding ulcer on 11/03/20. -She has required frequent blood transfusion, IV iron and bevacizumab -She does respond to treatment, anemia improved with IV iron and bevacizumab, but she is not compliant with her appointments. -Has not had treatment with bevacizumab due to recent hospitalization for ankle fracture and severe anemia. -Restart bevacizumab and IV iron every 2 weeks.  Patient and her sister both understand importance of attending all scheduled appointments.   Right ankle fracture Patient currently recovering from right ankle fracture.  She lost her balance and fell after a significant nosebleed.  Was hospitalized for about a month.  Received several blood transfusions while hospitalized.  Has not had treatment with bevacizumab since ankle fracture.  Frequent nosebleeds Most recent nosebleed was this morning.  She was able to get it stopped without much trouble.  No other evidence of abnormal bleeding.  Has noticed no blood in the stool no hematemesis.  Plan: Patient seen along with Dr. Mosetta Putt today. Labs reviewed today.  No blood transfusion needed today. Will set up  for bevacizumab and IV iron ASAP.  Repeat every 2 weeks. Emphasized importance of attending all appointments as scheduled.  Patient and her sister both voiced understanding.   The patient understands the plans discussed today and is in agreement with them.  She knows to contact our office if she develops concerns prior to her next appointment.  I provided 25 minutes of face-to-face time during this encounter and > 50% was spent counseling as documented under my assessment and plan.    Carlean Jews, NP  [facility] - A DEPT OF [facility] [address] [place] [address] Dept: [phone] Dept Fax: [phone]   No orders of the defined types were placed in this encounter.     CHIEF COMPLAINT:  CC: iron deficiency anemia due to chronic blood loss   Current Treatment:  bevacizumab every 2 weeks IV iron every 2 weeks or as needed Blood transfusion if hemoglobin   INTERVAL HISTORY:  Peggy House is here today for repeat clinical assessment. Last follow up visit 03/19/2023.  Has not been seen in cancer center due to right ankle fracture and subsequent hospitalization.  She did receive several blood transfusions while hospitalized with noticeable recovery and blood counts.  Has been off bevacizumab since last treatment in December.  Continues to have frequent nosebleeds.  Denies other abnormal bleeding such as blood in her stool, blood in her urine, abnormal bruising, or hematemesis.  Currently in a cast/boot for right ankle fracture.  Sister is with her to help with mobility and accompany patient to provider appointments.  She denies chest pain, chest pressure, or shortness of breath.  She denies headaches or visual disturbances. She denies abdominal pain, nausea, vomiting, or changes in bowel or bladder habits.  She denies fevers or chills. She denies pain. Her appetite is good.  She is unable to weigh this morning as she is  unable to stand on the scale at this time..  I have reviewed the past medical history, past surgical history, social history and family history with the patient and they are unchanged from previous note.  ALLERGIES:  is allergic to feraheme [ferumoxytol], nsaids, tomato, iron (ferrous sulfate) [ferrous sulfate er], and wasp venom.  MEDICATIONS:  Current Outpatient Medications  Medication Sig Dispense Refill   Accu-Chek Softclix Lancets lancets Use to check blood sugar before breakfast and before dinner while on steroids (Patient taking differently: 1 each by Other route See admin instructions. Use to check blood sugar before breakfast and before dinner while on steroids) 100 each 1   acetaminophen (TYLENOL) 325 MG tablet Take 2 tablets (650 mg total) by mouth every 6 (six) hours as needed. 100 tablet 0   ALPRAZolam (XANAX) 0.5 MG tablet TAKE 1 TABLET BY MOUTH AT BEDTIME AS NEEDED FOR ANXIETY OR SLEEP 7 tablet 0   aminocaproic acid (AMICAR) 500 MG tablet Take 2 tablets (1,000 mg total) by mouth every 12 (twelve) hours. 120 tablet 3   amLODipine (NORVASC) 10 MG tablet Take 1 tablet (10 mg total) by mouth daily. 30 tablet 11   diclofenac Sodium (VOLTAREN ARTHRITIS PAIN) 1 % GEL Apply 4 g topically 4 (four) times daily. 100 g 1   glucose blood (ACCU-CHEK GUIDE) test strip Check blood sugar 2 times per day while on steroids before breakfast and dinner (Patient taking differently: 1 each by Other route See admin instructions. Check blood sugar 2 times per day while on steroids before breakfast and dinner) 50 each 3   lisinopril (ZESTRIL) 20 MG tablet Take 1 tablet (20 mg total) by mouth daily. 30 tablet 0   lisinopril (ZESTRIL) 20 MG tablet Take 1 tablet (20 mg total) by mouth daily. 30 tablet 3   oxyCODONE (OXY IR/ROXICODONE) 5 MG immediate release tablet Take 1-2 tablets (5-10 mg total) by mouth every 4 (four) hours as needed for severe pain (pain score 7-10) (Try oral before IV medicine.). 20 tablet 0    pantoprazole (PROTONIX) 40 MG tablet TAKE 1 TABLET(40 MG) BY MOUTH TWICE DAILY (Patient taking differently: Take 40 mg by mouth daily.) 180 tablet 1   PROAIR HFA 108 (90 Base) MCG/ACT inhaler INHALE 1 TO 2 PUFFS INTO THE LUNGS EVERY 6 HOURS AS NEEDED FOR WHEEZING OR SHORTNESS OF BREATH (Patient taking differently: Inhale 1-2 puffs into the lungs every 6 (six) hours as needed for wheezing or shortness of breath.) 8.5 g 0   senna-docusate (SENOKOT-S) 8.6-50 MG tablet Take 1 tablet by mouth at bedtime. 100 tablet 0   traZODone (DESYREL) 50 MG tablet Take 1 tablet (50 mg total) by mouth at bedtime as needed for sleep (if). 30 tablet 0   citalopram (CELEXA) 20 MG tablet Take 1 tablet (20 mg total) by mouth daily. 90 tablet 3   No current facility-administered medications for this visit.   Facility-Administered Medications Ordered in Other Visits  Medication Dose Route Frequency Provider Last Rate Last Admin   diphenhydrAMINE (BENADRYL) 25 mg capsule            diphenhydrAMINE (BENADRYL) 25 mg capsule            phenylephrine (NEO-SYNEPHRINE) 1 % nasal drops 2  drop  2 drop Left Nare Once Malachy Mood, MD           REVIEW OF SYSTEMS:   Constitutional: Denies fevers, chills or abnormal weight loss Eyes: Denies blurriness of vision Ears, nose, mouth, throat, and face: Denies mucositis or sore throat.  Frequent nosebleeds Respiratory: Denies cough, dyspnea or wheezes Cardiovascular: Denies palpitation, chest discomfort or lower extremity swelling Gastrointestinal:  Denies nausea, heartburn or change in bowel habits Skin: Denies abnormal skin rashes Lymphatics: Denies new lymphadenopathy or easy bruising Neurological:Denies numbness, tingling or new weaknesses Behavioral/Psych: Mood is stable, no new changes  All other systems were reviewed with the patient and are negative.   VITALS:   Today's Vitals   06/24/23 1000  BP: 138/70  Pulse: 68  Resp: 17  Temp: 97.8 F (36.6 C)  SpO2: 99%   PainSc: 8    Wt Readings from Last 3 Encounters:  06/10/23 234 lb 8 oz (106.4 kg)  05/27/23 235 lb (106.6 kg)  05/09/23 226 lb 8 oz (102.7 kg)    There is no height or weight on file to calculate BMI.  Performance status (ECOG): 1 - Symptomatic but completely ambulatory  PHYSICAL EXAM:   GENERAL:alert, no distress and comfortable SKIN: skin color, texture, turgor are normal, no rashes or significant lesions EYES: normal, Conjunctiva are pink and non-injected, sclera clear OROPHARYNX:no exudate, no erythema and lips, buccal mucosa, and tongue normal  NECK: supple, thyroid normal size, non-tender, without nodularity LYMPH:  no palpable lymphadenopathy in the cervical, axillary or inguinal LUNGS: clear to auscultation and percussion with normal breathing effort HEART: regular rate & rhythm and no murmurs and no lower extremity edema ABDOMEN:abdomen soft, non-tender and normal bowel sounds Musculoskeletal:no cyanosis of digits and no clubbing.  Right lower leg and ankle are currently in boot/cast.  Using a wheelchair for mobility. NEURO: alert & oriented x 3 with fluent speech, no focal motor/sensory deficits  LABORATORY DATA:  I have reviewed the data as listed    Component Value Date/Time   NA 144 06/24/2023 1052   NA 147 (H) 04/02/2017 1708   NA 141 02/05/2017 1407   K 3.5 06/24/2023 1052   K 3.5 02/05/2017 1407   CL 110 06/24/2023 1052   CO2 29 06/24/2023 1052   CO2 24 02/05/2017 1407   GLUCOSE 111 (H) 06/24/2023 1052   GLUCOSE 134 02/05/2017 1407   BUN 9 06/24/2023 1052   BUN 5 (L) 04/02/2017 1708   BUN 9.3 02/05/2017 1407   CREATININE 0.73 06/24/2023 1052   CREATININE 0.9 02/05/2017 1407   CALCIUM 8.9 06/24/2023 1052   CALCIUM 8.9 02/05/2017 1407   PROT 7.1 06/24/2023 1052   PROT 6.7 04/02/2017 1708   PROT 7.1 02/05/2017 1407   ALBUMIN 3.9 06/24/2023 1052   ALBUMIN 3.8 04/02/2017 1708   ALBUMIN 3.5 02/05/2017 1407   AST 10 (L) 06/24/2023 1052   AST 13  02/05/2017 1407   ALT 5 06/24/2023 1052   ALT 8 02/05/2017 1407   ALKPHOS 80 06/24/2023 1052   ALKPHOS 84 02/05/2017 1407   BILITOT 0.3 06/24/2023 1052   BILITOT 0.23 02/05/2017 1407   GFRNONAA >60 06/24/2023 1052   GFRAA >60 01/06/2020 0921    Lab Results  Component Value Date   WBC 10.9 (H) 06/24/2023   NEUTROABS 8.8 (H) 06/24/2023   HGB 9.2 (L) 06/24/2023   HCT 29.8 (L) 06/24/2023   MCV 83.2 06/24/2023   PLT 309 06/24/2023    Addendum I have seen  the patient, examined her. I agree with the assessment and and plan and have edited the notes.   Alainna was hospitalized in mid January 2025 for closed fracture of right ankle, she is recovering well, no open wound.  We have not seen her since then.  Will repeat her CBC C, iron study, today, and resume bevacizumab and IV iron as needed.  I again emphasized the importance of close follow-up due to her severe anemia, she was accompanied by her sister today, who will provide transportation and schedule her appointments.  Due to her multiple no-shows in the past, we will schedule only 2-3 appointments at each time.  Patient is agreeable with the plan.  All questions were answered.  I spent a total of 30 minutes for her visit today, more than 50% time on face-to-face counseling.  Malachy Mood MD 06/24/2023

## 2023-06-24 ENCOUNTER — Ambulatory Visit

## 2023-06-24 ENCOUNTER — Other Ambulatory Visit: Payer: Self-pay

## 2023-06-24 ENCOUNTER — Telehealth: Payer: Self-pay | Admitting: Nurse Practitioner

## 2023-06-24 ENCOUNTER — Inpatient Hospital Stay: Payer: Medicaid Other | Attending: Hematology | Admitting: Nurse Practitioner

## 2023-06-24 VITALS — BP 138/70 | HR 68 | Temp 97.8°F | Resp 17

## 2023-06-24 VITALS — BP 166/79 | HR 65 | Temp 98.4°F | Resp 18

## 2023-06-24 DIAGNOSIS — Z5112 Encounter for antineoplastic immunotherapy: Secondary | ICD-10-CM | POA: Insufficient documentation

## 2023-06-24 DIAGNOSIS — D500 Iron deficiency anemia secondary to blood loss (chronic): Secondary | ICD-10-CM | POA: Diagnosis present

## 2023-06-24 DIAGNOSIS — Z95828 Presence of other vascular implants and grafts: Secondary | ICD-10-CM

## 2023-06-24 DIAGNOSIS — I780 Hereditary hemorrhagic telangiectasia: Secondary | ICD-10-CM | POA: Insufficient documentation

## 2023-06-24 LAB — CMP (CANCER CENTER ONLY)
ALT: 5 U/L (ref 0–44)
AST: 10 U/L — ABNORMAL LOW (ref 15–41)
Albumin: 3.9 g/dL (ref 3.5–5.0)
Alkaline Phosphatase: 80 U/L (ref 38–126)
Anion gap: 5 (ref 5–15)
BUN: 9 mg/dL (ref 8–23)
CO2: 29 mmol/L (ref 22–32)
Calcium: 8.9 mg/dL (ref 8.9–10.3)
Chloride: 110 mmol/L (ref 98–111)
Creatinine: 0.73 mg/dL (ref 0.44–1.00)
GFR, Estimated: >60 mL/min (ref >60)
Glucose, Bld: 111 mg/dL — ABNORMAL HIGH (ref 70–99)
Potassium: 3.5 mmol/L (ref 3.5–5.1)
Sodium: 144 mmol/L (ref 135–145)
Total Bilirubin: 0.3 mg/dL (ref 0.0–1.2)
Total Protein: 7.1 g/dL (ref 6.5–8.1)

## 2023-06-24 LAB — CBC WITH DIFFERENTIAL (CANCER CENTER ONLY)
Abs Immature Granulocytes: 0.03 10*3/uL (ref 0.00–0.07)
Basophils Absolute: 0.1 10*3/uL (ref 0.0–0.1)
Basophils Relative: 1 %
Eosinophils Absolute: 0.1 10*3/uL (ref 0.0–0.5)
Eosinophils Relative: 1 %
HCT: 29.8 % — ABNORMAL LOW (ref 36.0–46.0)
Hemoglobin: 9.2 g/dL — ABNORMAL LOW (ref 12.0–15.0)
Immature Granulocytes: 0 %
Lymphocytes Relative: 12 %
Lymphs Abs: 1.3 10*3/uL (ref 0.7–4.0)
MCH: 25.7 pg — ABNORMAL LOW (ref 26.0–34.0)
MCHC: 30.9 g/dL (ref 30.0–36.0)
MCV: 83.2 fL (ref 80.0–100.0)
Monocytes Absolute: 0.5 10*3/uL (ref 0.1–1.0)
Monocytes Relative: 5 %
Neutro Abs: 8.8 10*3/uL — ABNORMAL HIGH (ref 1.7–7.7)
Neutrophils Relative %: 81 %
Platelet Count: 309 10*3/uL (ref 150–400)
RBC: 3.58 MIL/uL — ABNORMAL LOW (ref 3.87–5.11)
RDW: 20.2 % — ABNORMAL HIGH (ref 11.5–15.5)
WBC Count: 10.9 10*3/uL — ABNORMAL HIGH (ref 4.0–10.5)
nRBC: 0 % (ref 0.0–0.2)

## 2023-06-24 LAB — IRON AND IRON BINDING CAPACITY (CC-WL,HP ONLY)
Iron: 29 ug/dL (ref 28–170)
Saturation Ratios: 10 % — ABNORMAL LOW (ref 10.4–31.8)
TIBC: 284 ug/dL (ref 250–450)
UIBC: 255 ug/dL (ref 148–442)

## 2023-06-24 LAB — FERRITIN: Ferritin: 20 ng/mL (ref 11–307)

## 2023-06-24 LAB — TOTAL PROTEIN, URINE DIPSTICK: Protein, ur: 30 mg/dL — AB

## 2023-06-24 LAB — SAMPLE TO BLOOD BANK

## 2023-06-24 MED ORDER — SODIUM CHLORIDE 0.9% FLUSH
10.0000 mL | Freq: Once | INTRAVENOUS | Status: AC
  Administered 2023-06-24: 10 mL

## 2023-06-24 MED ORDER — SODIUM CHLORIDE 0.9 % IV SOLN: 250.0000 mg | Freq: Once | INTRAVENOUS | Status: DC

## 2023-06-24 MED ORDER — HEPARIN SOD (PORK) LOCK FLUSH 100 UNIT/ML IV SOLN
500.0000 [IU] | Freq: Once | INTRAVENOUS | Status: AC
  Administered 2023-06-24: 500 [IU]

## 2023-06-24 NOTE — Telephone Encounter (Signed)
 Patient's care taker is aware of scheduled appointment on 06/26/2023 and also upcoming appointments; patient has callback number for scheduling if and conflicts or delays with appointment on 06/26/2023

## 2023-06-25 ENCOUNTER — Other Ambulatory Visit (HOSPITAL_COMMUNITY): Payer: Self-pay

## 2023-06-26 ENCOUNTER — Inpatient Hospital Stay

## 2023-06-26 ENCOUNTER — Other Ambulatory Visit: Payer: Self-pay

## 2023-06-26 ENCOUNTER — Other Ambulatory Visit

## 2023-06-26 VITALS — BP 131/79 | HR 66 | Temp 98.1°F | Resp 16

## 2023-06-26 DIAGNOSIS — D500 Iron deficiency anemia secondary to blood loss (chronic): Secondary | ICD-10-CM

## 2023-06-26 DIAGNOSIS — Z5112 Encounter for antineoplastic immunotherapy: Secondary | ICD-10-CM | POA: Diagnosis not present

## 2023-06-26 DIAGNOSIS — Z95828 Presence of other vascular implants and grafts: Secondary | ICD-10-CM

## 2023-06-26 DIAGNOSIS — I780 Hereditary hemorrhagic telangiectasia: Secondary | ICD-10-CM

## 2023-06-26 MED ORDER — HEPARIN SOD (PORK) LOCK FLUSH 100 UNIT/ML IV SOLN
500.0000 [IU] | Freq: Once | INTRAVENOUS | Status: AC | PRN
  Administered 2023-06-26: 500 [IU]

## 2023-06-26 MED ORDER — ACETAMINOPHEN 325 MG PO TABS
650.0000 mg | ORAL_TABLET | Freq: Once | ORAL | Status: AC
  Administered 2023-06-26: 650 mg via ORAL
  Filled 2023-06-26: qty 2

## 2023-06-26 MED ORDER — SODIUM CHLORIDE 0.9% FLUSH
10.0000 mL | INTRAVENOUS | Status: DC | PRN
  Administered 2023-06-26: 10 mL

## 2023-06-26 MED ORDER — BEVACIZUMAB-ADCD CHEMO INJECTION 400 MG/16ML
5.0000 mg/kg | Freq: Once | INTRAVENOUS | Status: AC
  Administered 2023-06-26: 500 mg via INTRAVENOUS
  Filled 2023-06-26: qty 4

## 2023-06-26 MED ORDER — DIPHENHYDRAMINE HCL 25 MG PO CAPS
25.0000 mg | ORAL_CAPSULE | Freq: Once | ORAL | Status: AC
  Administered 2023-06-26: 25 mg via ORAL
  Filled 2023-06-26: qty 1

## 2023-06-26 MED ORDER — SODIUM CHLORIDE 0.9 % IV SOLN: Freq: Once | INTRAVENOUS | Status: AC

## 2023-06-26 MED ORDER — SODIUM CHLORIDE 0.9 % IV SOLN
250.0000 mg | Freq: Once | INTRAVENOUS | Status: AC
  Administered 2023-06-26: 250 mg via INTRAVENOUS
  Filled 2023-06-26: qty 20

## 2023-06-26 NOTE — Patient Instructions (Signed)
 [facility] - A DEPT OF [facility]  Discharge Instructions: Thank you for choosing [facility] to provide your oncology and hematology care.   If you have a lab appointment with the Cancer Center, please go directly to the Cancer Center and check in at the registration area.   Wear comfortable clothing and clothing appropriate for easy access to any Portacath or PICC line.   We strive to give you quality time with your provider. You may need to reschedule your appointment if you arrive late (15 or more minutes).  Arriving late affects you and other patients whose appointments are after yours.  Also, if you miss three or more appointments without notifying the office, you may be dismissed from the clinic at the provider's discretion.      For prescription refill requests, have your pharmacy contact our office and allow 72 hours for refills to be completed.    Today you received the following chemotherapy and/or immunotherapy agents: Bevacizumab      To help prevent nausea and vomiting after your treatment, we encourage you to take your nausea medication as directed.  BELOW ARE SYMPTOMS THAT SHOULD BE REPORTED IMMEDIATELY: *FEVER GREATER THAN 100.4 F (38 C) OR HIGHER *CHILLS OR SWEATING *NAUSEA AND VOMITING THAT IS NOT CONTROLLED WITH YOUR NAUSEA MEDICATION *UNUSUAL SHORTNESS OF BREATH *UNUSUAL BRUISING OR BLEEDING *URINARY PROBLEMS (pain or burning when urinating, or frequent urination) *BOWEL PROBLEMS (unusual diarrhea, constipation, pain near the anus) TENDERNESS IN MOUTH AND THROAT WITH OR WITHOUT PRESENCE OF ULCERS (sore throat, sores in mouth, or a toothache) UNUSUAL RASH, SWELLING OR PAIN  UNUSUAL VAGINAL DISCHARGE OR ITCHING   Items with * indicate a potential emergency and should be followed up as soon as possible or go to the Emergency Department if any problems should occur.  Please show the CHEMOTHERAPY ALERT CARD or  IMMUNOTHERAPY ALERT CARD at check-in to the Emergency Department and triage nurse.  Should you have questions after your visit or need to cancel or reschedule your appointment, please contact [facility] - A [facility]PT [facility]  Dept: [phone]  and follow the prompts.  Office hours are 8:00 a.m. to 4:30 p.m. Monday - Friday. Please note that voicemails left after 4:00 p.m. may not be returned until the following business day.  We are closed weekends and major holidays. You have access to a nurse at all times for urgent questions. Please call the main number to the clinic Dept: [phone] and follow the prompts.   For any non-urgent questions, you may also contact your provider using MyChart. We now offer e-Visits for anyone 37 and older to request care online for non-urgent symptoms. For details visit [email or web address].   Also download the MyChart app! Go to the app store, search "MyChart", open the app, select [place], and log in with your MyChart username and password.  Sodium Ferric Gluconate Complex Injection What is this medication? SODIUM FERRIC GLUCONATE COMPLEX (SOE dee um FER ik GLOO koe nate KOM pleks) treats low levels of iron (iron deficiency anemia) in people with kidney disease. Iron is a mineral that plays an important role in making red blood cells, which carry oxygen from your lungs to the rest of your body. This medicine may be used for other purposes; ask your health care provider or pharmacist if you have questions. COMMON BRAND NAME(S): Ferrlecit, Nulecit What should I tell my care  team before I take this medication? They need to know if you have any of the following conditions: Anemia that is not from iron deficiency High levels of iron in the blood An unusual or allergic reaction to iron, other medications, foods, dyes, or preservatives Pregnant or are trying to become pregnant Breast-feeding How should I use this  medication? This medication is injected into a vein. It is given by your care team in a hospital or clinic setting. Talk to your care team about the use of this medication in children. While it may be prescribed for children as young as 6 years for selected conditions, precautions do apply. Overdosage: If you think you have taken too much of this medicine contact a poison control center or emergency room at once. NOTE: This medicine is only for you. Do not share this medicine with others. What if I miss a dose? It is important not to miss your dose. Call your care team if you are unable to keep an appointment. What may interact with this medication? Do not take this medication with any of the following: Deferasirox Deferoxamine Dimercaprol This medication may also interact with the following: Other iron products This list may not describe all possible interactions. Give your health care provider a list of all the medicines, herbs, non-prescription drugs, or dietary supplements you use. Also tell them if you smoke, drink alcohol, or use illegal drugs. Some items may interact with your medicine. What should I watch for while using this medication? Your condition will be monitored carefully while you are receiving this medication. Visit your care team for regular checks on your progress. You may need blood work while you are taking this medication. What side effects may I notice from receiving this medication? Side effects that you should report to your care team as soon as possible: Allergic reactions--skin rash, itching, hives, swelling of the face, lips, tongue, or throat Low blood pressure--dizziness, feeling faint or lightheaded, blurry vision Shortness of breath Side effects that usually do not require medical attention (report to your care team if they continue or are bothersome): Flushing Headache Joint pain Muscle pain Nausea Pain, redness, or irritation at injection site This  list may not describe all possible side effects. Call your doctor for medical advice about side effects. You may report side effects to FDA at 1-800-FDA-1088. Where should I keep my medication? This medication is given in a hospital or clinic and will not be stored at home. NOTE: This sheet is a summary. It may not cover all possible information. If you have questions about this medicine, talk to your doctor, pharmacist, or health care provider.  2024 Elsevier/Gold Standard (2020-08-25 00:00:00)

## 2023-06-26 NOTE — Progress Notes (Deleted)
 Subjective:    Patient ID: Peggy House, female    DOB: 07/20/1960, 63 y.o.   MRN: [id number]  HPI   Pain Inventory Average Pain {NUMBERS; 0-10:5044} Pain Right Now {NUMBERS; 0-10:5044} My pain is {PAIN DESCRIPTION:21022940}  In the last 24 hours, has pain interfered with the following? General activity {NUMBERS; 0-10:5044} Relation with others {NUMBERS; 0-10:5044} Enjoyment of life {NUMBERS; 0-10:5044} What TIME of day is your pain at its worst? {time of day:24191} Sleep (in general) {BHH GOOD/FAIR/POOR:22877}  Pain is worse with: {ACTIVITIES:21022942} Pain improves with: {PAIN IMPROVES NWGN:56213086} Relief from Meds: {NUMBERS; 0-10:5044}  {MOBILITY VHQ:46962952}  {FUNCTION:21022946}  {NEURO/PSYCH:21022948}  {CPRM PRIOR STUDIES:21022953}  {CPRM PHYSICIANS INVOLVED IN YOUR CARE:21022954}    Family History  Problem Relation Age of Onset   Cancer Mother        Ovarian   Ovarian cancer Mother    Diabetes Mother    Kidney disease Mother    Bleeding Disorder Mother    Diabetes type II Sister    Bleeding Disorder Sister    Diabetes Sister    Colon cancer Maternal Grandfather    Crohn's disease Maternal Grandfather    Stomach cancer Maternal Grandmother    Kidney disease Son    Bleeding Disorder Son    Dysmenorrhea Neg Hx    Social History   Socioeconomic History   Marital status: Widowed    Spouse name: Not on file   Number of children: 3   Years of education: 64   Highest education level: Not on file  Occupational History   Not on file  Tobacco Use   Smoking status: Every Day    Current packs/day: 0.50    Average packs/day: 0.5 packs/day for 30.0 years (15.0 ttl pk-yrs)    Types: Cigarettes   Smokeless tobacco: Former    Types: Snuff    Quit date: 70   Tobacco comments:    1/2 PPD  Vaping Use   Vaping status: Never Used  Substance and Sexual Activity   Alcohol use: No    Alcohol/week: 0.0 standard drinks of alcohol   Drug use: No     Comment: last cocaine-2010   Sexual activity: Not Currently    Comment: Hysterectomy  Other Topics Concern   Not on file  Social History Narrative   Not on file   Social Drivers of Health   Financial Resource Strain: Not on file  Food Insecurity: No Food Insecurity (04/13/2023)   Hunger Vital Sign    Worried About Running Out of Food in the Last Year: Never true    Ran Out of Food in the Last Year: Never true  Transportation Needs: No Transportation Needs (04/13/2023)   PRAPARE - Administrator, Civil Service (Medical): No    Lack of Transportation (Non-Medical): No  Recent Concern: Transportation Needs - Unmet Transportation Needs (01/31/2023)   PRAPARE - Administrator, Civil Service (Medical): Yes    Lack of Transportation (Non-Medical): Yes  Physical Activity: Not on file  Stress: Not on file  Social Connections: Not on file   Past Surgical History:  Procedure Laterality Date   ABDOMINAL HYSTERECTOMY     CARPAL TUNNEL RELEASE  05/13/2011   Procedure: CARPAL TUNNEL RELEASE;  Surgeon: Mable Paris, MD;  Location:  S[facility]ERY CENTER;  Service: Orthopedics;  Laterality: Left;   COLONOSCOPY N/A 03/02/2020   Procedure: COLONOSCOPY;  Surgeon: Bernette Redbird, MD;  Location: [facility] ENDOSCOPY;  Service: Endoscopy;  Laterality: N/A;  COLONOSCOPY WITH PROPOFOL N/A 04/28/2014   Procedure: COLONOSCOPY WITH PROPOFOL;  Surgeon: Florencia Reasons, MD;  Location: [facility] ENDOSCOPY;  Service: Endoscopy;  Laterality: N/A;   DG TOES*L*  2/10   rt   DILATION AND CURETTAGE OF UTERUS     ENTEROSCOPY N/A 10/17/2017   Procedure: ENTEROSCOPY;  Surgeon: Kathi Der, MD;  Location: [facility] ENDOSCOPY;  Service: Gastroenterology;  Laterality: N/A;   ESOPHAGOGASTRODUODENOSCOPY N/A 04/10/2014   Procedure: ESOPHAGOGASTRODUODENOSCOPY (EGD);  Surgeon: Shirley Friar, MD;  Location: [facility] ENDOSCOPY;  Service: Endoscopy;  Laterality: N/A;   ESOPHAGOGASTRODUODENOSCOPY N/A  05/10/2017   Procedure: ESOPHAGOGASTRODUODENOSCOPY (EGD);  Surgeon: Bernette Redbird, MD;  Location: [facility] ENDOSCOPY;  Service: Endoscopy;  Laterality: N/A;   ESOPHAGOGASTRODUODENOSCOPY N/A 09/22/2017   Procedure: ESOPHAGOGASTRODUODENOSCOPY (EGD);  Surgeon: Vida Rigger, MD;  Location: [facility] ENDOSCOPY;  Service: Endoscopy;  Laterality: N/A;  bedside   ESOPHAGOGASTRODUODENOSCOPY N/A 03/02/2020   Procedure: ESOPHAGOGASTRODUODENOSCOPY (EGD);  Surgeon: Bernette Redbird, MD;  Location: [facility]COPY;  Service: Endoscopy;  Laterality: N/A;   ESOPHAGOGASTRODUODENOSCOPY N/A 11/03/2020   Procedure: ESOPHAGOGASTRODUODENOSCOPY (EGD);  Surgeon: Charlott Rakes, MD;  Location: [facility] ENDOSCOPY;  Service: Endoscopy;  Laterality: N/A;   ESOPHAGOGASTRODUODENOSCOPY N/A 04/12/2022   Procedure: ESOPHAGOGASTRODUODENOSCOPY (EGD);  Surgeon: Vida Rigger, MD;  Location: [facility] ENDOSCOPY;  Service: Gastroenterology;  Laterality: N/A;   ESOPHAGOGASTRODUODENOSCOPY N/A 05/27/2022   Procedure: ESOPHAGOGASTRODUODENOSCOPY (EGD);  Surgeon: Willis Modena, MD;  Location: [facility] ENDOSCOPY;  Service: Gastroenterology;  Laterality: N/A;   ESOPHAGOGASTRODUODENOSCOPY N/A 09/27/2022   Procedure: ESOPHAGOGASTRODUODENOSCOPY (EGD);  Surgeon: Lynann Bologna, DO;  Location: [facility] ENDOSCOPY;  Service: Gastroenterology;  Laterality: N/A;   ESOPHAGOGASTRODUODENOSCOPY (EGD) WITH PROPOFOL N/A 04/27/2014   Procedure: ESOPHAGOGASTRODUODENOSCOPY (EGD) WITH PROPOFOL;  Surgeon: Florencia Reasons, MD;  Location: [facility] ENDOSCOPY;  Service: Endoscopy;  Laterality: N/A;  possible apc   ESOPHAGOGASTRODUODENOSCOPY (EGD) WITH PROPOFOL N/A 09/30/2017   Procedure: ESOPHAGOGASTRODUODENOSCOPY (EGD) WITH PROPOFOL;  Surgeon: Kerin Salen, MD;  Location: [facility] ENDOSCOPY;  Service: Gastroenterology;  Laterality: N/A;   ESOPHAGOGASTRODUODENOSCOPY (EGD) WITH PROPOFOL N/A 10/01/2017   Procedure: ESOPHAGOGASTRODUODENOSCOPY (EGD) WITH PROPOFOL;  Surgeon: Kerin Salen, MD;  Location: [facility] ENDOSCOPY;   Service: Gastroenterology;  Laterality: N/A;   ESOPHAGOGASTRODUODENOSCOPY (EGD) WITH PROPOFOL N/A 10/08/2017   Procedure: ESOPHAGOGASTRODUODENOSCOPY (EGD) WITH PROPOFOL;  Surgeon: Kathi Der, MD;  Location[facility] ENDOSCOPY;  Service: Gastroenterology;  Laterality: N/A;   ESOPHAGOGASTRODUODENOSCOPY (EGD) WITH PROPOFOL N/A 10/17/2017   Procedure: ESOPHAGOGASTRODUODENOSCOPY (EGD) WITH PROPOFOL;  Surgeon: Kathi Der, MD;  Location: [facility] ENDOSCOPY;  Service: Gastroenterology;  Laterality: N/A;   ESOPHAGOGASTRODUODENOSCOPY (EGD) WITH PROPOFOL N/A 10/19/2017   Procedure: ESOPHAGOGASTRODUODENOSCOPY (EGD) WITH PROPOFOL;  Surgeon: Kathi Der, MD;  [facility] ENDOSCOPY;  Service: Gastroenterology;  Laterality: N/A;   ESOPHAGOGASTRODUODENOSCOPY (EGD) WITH PROPOFOL N/A 12/04/2018   Procedure: ESOPHAGOGASTRODUODENOSCOPY (EGD) WITH PROPOFOL;  Surgeon: Charlott Rakes, MD;  Location: [facility] ENDOSCOPY;  Service: Endoscopy;  Laterality: N/A;   GIVENS CAPSULE STUDY N/A 10/02/2017   Procedure: GIVENS CAPSULE STUDY;  Surgeon: Kerin Salen, MD;  Location: [facility] ENDOSCOPY;  Service: Gastroenterology;  Laterality: N/A;   GIVENS CAPSULE STUDY N/A 10/08/2017   Procedure: GIVENS CAPSULE STUDY;  Surgeon: Kathi Der, MD;  Location: [facility] ENDOSCOPY;  Service: Gastroenterology;  Laterality: N/A;  endoscopic placement of capsule   GIVENS CAPSULE STUDY N/A 03/02/2020   Procedure: GIVENS CAPSULE STUDY;  Surgeon: Bernette Redbird, MD;  Location: [facility] ENDOSCOPY;  Service: Endoscopy;  Laterality: N/A;   HEMOSTASIS CLIP PLACEMENT  12/04/2018   Procedure: HEMOSTASIS CLIP PLACEMENT;  Surgeon: Charlott Rakes, MD;  Location: [facility] ENDOSCOPY;  Service: Endoscopy;;   HEMOSTASIS CLIP PLACEMENT  05/27/2022   Procedure: HEMOSTASIS CLIP PLACEMENT;  Surgeon: Willis Modena, MD;  Location: [facility] ENDOSCOPY;  Service: Gastroenterology;;   HEMOSTASIS CLIP PLACEMENT  09/27/2022   Procedure: HEMOSTASIS CLIP PLACEMENT;  Surgeon: Lynann Bologna,  DO;  Location: [facility] ENDOSCOPY;  Service: Gastroenterology;;   HEMOSTASIS CONTROL  05/27/2022   Procedure: HEMOSTASIS CONTROL;  Surgeon: Willis Modena, MD;  Location: [facility] ENDOSCOPY;  Service: Gastroenterology;;   HOT HEMOSTASIS N/A 04/27/2014   Procedure: HOT HEMOSTASIS (ARGON PLASMA COAGULATION/BICAP);  Surgeon: Florencia Reasons, MD;  Location[facility] ENDOSCOPY;  Service: Endoscopy;  Laterality: N/A;   HOT HEMOSTASIS N/A 09/30/2017   Procedure: HOT HEMOSTASIS (ARGON PLASMA COAGULATION/BICAP);  Surgeon: Kerin Salen, MD;  Location: [facility] ENDOSCOPY;  Service: Gastroenterology;  Laterality: N/A;   HOT HEMOSTASIS N/A 10/01/2017   Procedure: HOT HEMOSTASIS (ARGON PLASMA COAGULATION/BICAP);  Surgeon: Kerin Salen, MD;  Location: [facility] ENDOSCOPY;  Service: Gastroenterology;  Laterality: N/A;   HOT HEMOSTASIS N/A 10/17/2017   Procedure: HOT HEMOSTASIS (ARGON PLASMA COAGULATION/BICAP);  Surgeon: Kathi Der, MD;  Location: [facility] ENDOSCOPY;  Service: Gastroenterology;  Laterality: N/A;   HOT HEMOSTASIS N/A 10/19/2017   Procedure: HOT HEMOSTASIS (ARGON PLASMA COAGULATION/BICAP);  Surgeon: Kathi Der, MD;  Location: [facility] ENDOSCOPY;  Service: Gastroenterology;  Laterality: N/A;   HOT HEMOSTASIS N/A 03/02/2020   Procedure: HOT HEMOSTASIS (ARGON PLASMA COAGULATION/BICAP);  Surgeon: Bernette Redbird, MD;  Location: [facility] ENDOSCOPY;  Service: Endoscopy;  Laterality: N/A;   HOT HEMOSTASIS N/A 04/12/2022   Procedure: HOT HEMOSTASIS (ARGON PLASMA COAGULATION/BICAP);  Surgeon: Vida Rigger, MD;  Location: [facility] ENDOSCOPY;  Service: Gastroenterology;  Laterality: N/A;   IR IMAGING GUIDED PORT INSERTION  07/08/2018   L shoulder Surgery  2011   ORIF ANKLE FRACTURE Right 04/24/2023   Procedure: OPEN REDUCTION INTERNAL FIXATION (ORIF) ANKLE FRACTURE;  Surgeon: Luci Bank, MD;  Location: [facility] OR;  Service: Orthopedics;  Laterality: Right;   POLYPECTOMY  03/02/2020   Procedure: POLYPECTOMY;  Surgeon: Bernette Redbird, MD;  Location: [facility]  ENDOSCOPY;  Service: Endoscopy;;   SCLEROTHERAPY  11/03/2020   Procedure: Susa Day;  Surgeon: Charlott Rakes, MD;  Location: [facility] ENDOSCOPY;  Service: Endoscopy;;   SUBMUCOSAL INJECTION  09/22/2017   Procedure: SUBMUCOSAL INJECTION;  Surgeon: Vida Rigger, MD;  Location[facility] ENDOSCOPY;  Service: Endoscopy;;   SUBMUCOSAL INJECTION  12/04/2018   Procedure: SUBMUCOSAL INJECTION;  Surgeon: Charlott Rakes, MD;  Location: [facility] ENDOSCOPY;  Service: Endoscopy;;   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  Opioid Risk Score:   Fall Risk Score:  `1  Depression screen [facility] 2/9     06/10/2023    9:47 AM 06/20/2022    2:11 PM 11/21/2021    3:14 PM 07/19/2020    4:18 PM 06/15/2020   11:34 AM 03/01/2019    4:11 PM 01/20/2018    4:21 PM  Depression screen PHQ 2/9  Decreased Interest 3 2 3 2 2 2 1   Down, Depressed, Hopeless 2 3 3 2 2 1 1   PHQ - 2 Score 5 5  6 4 4 3 2   Altered sleeping 3 3 3 3 3 3 2   Tired, decreased energy 3 2 3 2 2 2 1   Change in appetite 3 3 3 2 2 2 1   Feeling bad or failure about yourself  2 3 2 2 2 3 2   Trouble concentrating 2 3 3 2 3 3 3   Moving slowly or fidgety/restless 0 3 2 2 3 1 2   Suicidal thoughts 0 0 0 0 2 0 0  PHQ-9 Score 18 22 22 17 21 17 13   Difficult doing work/chores Somewhat difficult Somewhat difficult  Somewhat difficult Somewhat difficult Extremely dIfficult Somewhat difficult    Review of Systems     Objective:   Physical Exam        Assessment & Plan:

## 2023-06-26 NOTE — Progress Notes (Signed)
 Patient declined post iron observation.  Tolerated treatment well without incident.  Taken to lobby in wheelchair.

## 2023-06-27 ENCOUNTER — Other Ambulatory Visit: Payer: Self-pay | Admitting: Student

## 2023-06-27 ENCOUNTER — Encounter: Payer: Medicaid Other | Admitting: Physical Medicine and Rehabilitation

## 2023-06-27 NOTE — Telephone Encounter (Signed)
 Last Fill: Alprazolam: 12/07/22     Voltaren: 06/10/23     Trazodone: 05/19/23  Last OV: 06/10/23 Next OV: 07/09/23  Routing to provider for review/authorization.

## 2023-06-27 NOTE — Telephone Encounter (Signed)
 Copied from CRM [id number]. Topic: Clinical - Medication Refill >> Jun 27, 2023  2:51 PM Carrielelia G wrote: Most Recent Primary Care Visit:  Provider: Annett Fabian  Department: [facility]  Visit Type: OPEN ESTABLISHED  Date: 06/10/2023  Medication:  ALPRAZolam (XANAX) 0.5 MG tablet  diclofenac Sodium (VOLTAREN ARTHRITIS PAIN) 1 % GEL  traZDone (DESYREL) 50 MG tablet     Has the patient contacted their pharmacy? Yes (Agent: If no, request that the patient contact the pharmacy for the refill. If patient does not wish to contact the pharmacy document the reason why and proceed with request.) (Agent: If yes, when and what did the pharmacy advise?)  Is this the correct pharmacy for this prescription? Yes If no, delete pharmacy and type the correct one.  This is the patient's preferred pharmacy:  [facility] [id number] - [place], [place] - [address] AT [facility] [address] [place] [address] Phone: [phone] Fax: [phone]  Has the prescription been filled recently? Yes  Is the patient out of the medication? Yes  Has the patient been seen for an appointment in the last year OR does the patient have an upcoming appointment? Yes  Can we respond through MyChart? No  Agent: Please be advised that Rx refills may take up to 3 business days. We ask that you follow-up with your pharmacy.

## 2023-06-29 ENCOUNTER — Encounter: Payer: Self-pay | Admitting: Nurse Practitioner

## 2023-06-30 ENCOUNTER — Encounter: Payer: Self-pay | Admitting: Hematology

## 2023-06-30 ENCOUNTER — Other Ambulatory Visit (HOSPITAL_COMMUNITY): Payer: Self-pay

## 2023-07-01 ENCOUNTER — Inpatient Hospital Stay: Admitting: Physical Medicine and Rehabilitation

## 2023-07-01 MED ORDER — DICLOFENAC SODIUM 1 % EX GEL: 4.0000 g | Freq: Four times a day (QID) | CUTANEOUS | 1 refills | Status: DC

## 2023-07-01 MED ORDER — TRAZODONE HCL 50 MG PO TABS: 50.0000 mg | ORAL_TABLET | Freq: Every evening | ORAL | 0 refills | Status: DC | PRN

## 2023-07-01 NOTE — Assessment & Plan Note (Addendum)
 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 -she has required multiple blood transfusions and APCs since 2019. 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 -She had multiple prolonged hospital stay due to severe GI bleeding.   -s/p IR embolization of gastric artery branch vessels on 12/10/17, cauterization of stomach for severe GI bleed in 11/2018, and epinephrine injection for bleeding ulcer on 11/03/20. -She has required frequent blood transfusion, IV iron and bevacizumab -She does respond to treatment, anemia improved with IV iron and bevacizumab, but she is not compliant with her appointments. -Has not had treatment with bevacizumab due to recent hospitalization for ankle fracture and severe anemia. -Restart bevacizumab and IV iron every 2 weeks.  Patient and her sister both understand importance of attending all scheduled appointments. -Patient received IV iron and bevacizumab on 06/26/2023. -- Patient states she has done well since last treatment with bevacizumab and IV iron.  No nosebleeds or GI bleeds. --Ensure bevacizumab every [redacted] weeks along with IV iron.

## 2023-07-01 NOTE — Progress Notes (Signed)
 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 -she has required multiple blood transfusions and APCs since 2019. 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 -She had multiple prolonged hospital stay due to severe GI bleeding.   -s/p IR embolization of gastric artery branch vessels on 12/10/17, cauterization of stomach for severe GI bleed in 11/2018, and epinephrine injection for bleeding ulcer on 11/03/20. -She has required frequent blood transfusion, IV iron and bevacizumab -She does respond to treatment, anemia improved with IV iron and bevacizumab, but she is not compliant with her appointments. -Has not had treatment with bevacizumab due to recent hospitalization for ankle fracture and severe anemia. -Restart bevacizumab and IV iron every 2 weeks.  Patient and her sister both understand importance of attending all scheduled appointments. -Patient received IV iron and bevacizumab on 06/26/2023. -- Patient states she has done well since last treatment with bevacizumab and IV iron.  No nosebleeds or GI bleeds. --Ensure bevacizumab every [redacted] weeks along with IV iron.   Plan:  Labs reviewed.  Hemoglobin beginning strep at 8.0 and hematocrit 26.5. -Patient received IV iron on 07/05/2023..  -Set up next treatment weeks of bevacizumab and IV iron in 6 days. -Ensure labs/flush, follow-up, and treatments every 2 weeks. I The patient understands the plans discussed today and is in agreement with them.  She knows to contact our office if she develops concerns  prior to her next appointment.  I provided 25 minutes of face-to-face time during this encounter and > 50% was spent counseling as documented under my assessment and plan.    Carlean Jews, NP   [facility] - A DEPT OF [facility] [address] [place] [address] Dept: [phone] Dept Fax: [phone]   Orders Placed This Encounter  Procedures   CBC with Differential (Cancer Center Only)    Standing Status:   Future    Expected Date:   08/07/2023    Expiration Date:   08/06/2024   Total Protein, Urine dipstick    Standing Status:   Future    Expected Date:   08/07/2023    Expiration Date:   08/06/2024   CBC with Differential (Cancer Center Only)    Standing Status:   Future    Expected Date:   08/21/2023    Expiration Date:   08/20/2024   Total Protein, Urine dipstick    Standing Status:   Future    Expected Date:   08/21/2023    Expiration Date:   08/20/2024   CBC with Differential (Cancer Center Only)    Standing Status:   Future    Expected Date:   09/04/2023    Expiration Date:   09/03/2024   Total Protein, Urine dipstick    Standing Status:   Future    Expected Date:   09/04/2023    Expiration Date:   09/03/2024   CBC with Differential (Cancer Center Only)    Standing Status:   Future    Expected Date:   09/18/2023  Expiration Date:   09/17/2024   Total Protein, Urine dipstick    Standing Status:   Future    Expected Date:   09/18/2023    Expiration Date:   09/17/2024      CHIEF COMPLAINT:  CC: HTN (hereditary hemorrhagic telangiectasia with severe recurrent GI bleeding and epistaxis  Current Treatment: Bevacizumab every 2 weeks, IV iron every 2 weeks or as needed, blood transfusion for hemoglobin <8.0  INTERVAL HISTORY:  Zerina is here today for repeat clinical assessment.  Last seen by myself on 06/24/2023.  She has received bevacizumab and IV iron on 06/26/2023.  She is scheduled she reports no recent  nosebleeds and her blood in her stool.  She continues to be in orthopedic boot/cast.  Does have moderate right ankle pain.  Set up to start rehab for this on Friday.  She denies chest pain, chest pressure, or shortness of breath. She denies headaches or visual disturbances. She denies abdominal pain, nausea, vomiting, or changes in bowel or bladder habits.  She denies fevers or chills. She denies pain. Her appetite is good. Her weight has increased 2 pounds over last 2 weeks .  I have reviewed the past medical history, past surgical history, social history and family history with the patient and they are unchanged from previous note.  ALLERGIES:  is allergic to feraheme [ferumoxytol], nsaids, tomato, iron (ferrous sulfate) [ferrous sulfate er], and wasp venom.  MEDICATIONS:  Current Outpatient Medications  Medication Sig Dispense Refill   Accu-Chek Softclix Lancets lancets Use to check blood sugar before breakfast and before dinner while on steroids (Patient taking differently: 1 each by Other route See admin instructions. Use to check blood sugar before breakfast and before dinner while on steroids) 100 each 1   acetaminophen (TYLENOL) 325 MG tablet Take 2 tablets (650 mg total) by mouth every 6 (six) hours as needed. 100 tablet 0   ALPRAZolam (XANAX) 0.5 MG tablet TAKE 1 TABLET BY MOUTH AT BEDTIME AS NEEDED FOR ANXIETY OR SLEEP 7 tablet 0   aminocaproic acid (AMICAR) 500 MG tablet Take 2 tablets (1,000 mg total) by mouth every 12 (twelve) hours. 120 tablet 3   amLODipine (NORVASC) 10 MG tablet Take 1 tablet (10 mg total) by mouth daily. 30 tablet 11   diclofenac Sodium (VOLTAREN ARTHRITIS PAIN) 1 % GEL Apply 4 g topically 4 (four) times daily. 100 g 1   glucose blood (ACCU-CHEK GUIDE) test strip Check blood sugar 2 times per day while on steroids before breakfast and dinner (Patient taking differently: 1 each by Other route See admin instructions. Check blood sugar 2 times per day while on steroids  before breakfast and dinner) 50 each 3   lisinopril (ZESTRIL) 20 MG tablet Take 1 tablet (20 mg total) by mouth daily. 30 tablet 0   lisinopril (ZESTRIL) 20 MG tablet Take 1 tablet (20 mg total) by mouth daily. 30 tablet 3   oxyCODONE (OXY IR/ROXICODONE) 5 MG immediate release tablet Take 1-2 tablets (5-10 mg total) by mouth every 4 (four) hours as needed for severe pain (pain score 7-10) (Try oral before IV medicine.). 20 tablet 0   pantoprazole (PROTONIX) 40 MG tablet TAKE 1 TABLET(40 MG) BY MOUTH TWICE DAILY (Patient taking differently: Take 40 mg by mouth daily.) 180 tablet 1   PROAIR HFA 108 (90 Base) MCG/ACT inhaler INHALE 1 TO 2 PUFFS INTO THE LUNGS EVERY 6 HOURS AS NEEDED FOR WHEEZING OR SHORTNESS OF BREATH (Patient taking differently: Inhale 1-2 puffs into  the lungs every 6 (six) hours as needed for wheezing or shortness of breath.) 8.5 g 0   senna-docusate (SENOKOT-S) 8.6-50 MG tablet Take 1 tablet by mouth at bedtime. 100 tablet 0   traZODone (DESYREL) 50 MG tablet Take 1 tablet (50 mg total) by mouth at bedtime as needed for sleep (if). 30 tablet 0   citalopram (CELEXA) 20 MG tablet Take 1 tablet (20 mg total) by mouth daily. 90 tablet 3   gabapentin (NEURONTIN) 100 MG capsule Take 1 capsule (100 mg total) by mouth at bedtime. 90 capsule 3   No current facility-administered medications for this visit.   Facility-Administered Medications Ordered in Other Visits  Medication Dose Route Frequency Provider Last Rate Last Admin   diphenhydrAMINE (BENADRYL) 25 mg capsule            diphenhydrAMINE (BENADRYL) 25 mg capsule            phenylephrine (NEO-SYNEPHRINE) 1 % nasal drops 2 drop  2 drop Left Nare Once Malachy Mood, MD          REVIEW OF SYSTEMS:   Constitutional: Denies fevers, chills or abnormal weight loss Eyes: Denies blurriness of vision Ears, nose, mouth, throat, and face: Denies mucositis or sore throat Respiratory: Denies cough, dyspnea or wheezes Cardiovascular: Denies  palpitation, chest discomfort or lower extremity swelling Gastrointestinal:  Denies nausea, heartburn or change in bowel habits Skin: Denies abnormal skin rashes Lymphatics: Denies new lymphadenopathy or easy bruising Neurological:Denies numbness, tingling or new weaknesses Behavioral/Psych: Mood is stable, no new changes  All other systems were reviewed with the patient and are negative.   VITALS:   Today's Vitals   07/02/23 1351  BP: 130/88  Pulse: 62  Resp: 17  Temp: 97.8 F (36.6 C)  TempSrc: Temporal  SpO2: 99%   There is no height or weight on file to calculate BMI.    Wt Readings from Last 3 Encounters:  07/04/23 232 lb (105.2 kg)  06/10/23 234 lb 8 oz (106.4 kg)  05/27/23 235 lb (106.6 kg)    There is no height or weight on file to calculate BMI.  Performance status (ECOG): 1 - Symptomatic but completely ambulatory  PHYSICAL EXAM:   GENERAL:alert, no distress and comfortable SKIN: skin color, texture, turgor are normal, no rashes or significant lesions EYES: normal, Conjunctiva are pink and non-injected, sclera clear OROPHARYNX:no exudate, no erythema and lips, buccal mucosa, and tongue normal  NECK: supple, thyroid normal size, non-tender, without nodularity LYMPH:  no palpable lymphadenopathy in the cervical, axillary or inguinal LUNGS: clear to auscultation and percussion with normal breathing effort HEART: regular rate & rhythm and no murmurs and no lower extremity edema ABDOMEN:abdomen soft, non-tender and normal bowel sounds Musculoskeletal:no cyanosis of digits and no clubbing.  Right ankle pain.  Plans to start rehab for this on Friday. NEURO: alert & oriented x 3 with fluent speech, no focal motor/sensory deficits  LABORATORY DATA:  I have reviewed the data as listed    Component Value Date/Time   NA 140 07/02/2023 1318   NA 147 (H) 04/02/2017 1708   NA 141 02/05/2017 1407   K 3.5 07/02/2023 1318   K 3.5 02/05/2017 1407   CL 106 07/02/2023 1318    CO2 28 07/02/2023 1318   CO2 24 02/05/2017 1407   GLUCOSE 108 (H) 07/02/2023 1318   GLUCOSE 134 02/05/2017 1407   BUN 10 07/02/2023 1318   BUN 5 (L) 04/02/2017 1708   BUN 9.3 02/05/2017 1407  CREATININE 0.73 07/02/2023 1318   CREATININE 0.9 02/05/2017 1407   CALCIUM 9.1 07/02/2023 1318   CALCIUM 8.9 02/05/2017 1407   PROT 7.1 07/02/2023 1318   PROT 6.7 04/02/2017 1708   PROT 7.1 02/05/2017 1407   ALBUMIN 3.9 07/02/2023 1318   ALBUMIN 3.8 04/02/2017 1708   ALBUMIN 3.5 02/05/2017 1407   AST 13 (L) 07/02/2023 1318   AST 13 02/05/2017 1407   ALT 5 07/02/2023 1318   ALT 8 02/05/2017 1407   ALKPHOS 77 07/02/2023 1318   ALKPHOS 84 02/05/2017 1407   BILITOT 0.3 07/02/2023 1318   BILITOT 0.23 02/05/2017 1407   GFRNONAA >60 07/02/2023 1318   GFRAA >60 01/06/2020 0921    Lab Results  Component Value Date   WBC 11.4 (H) 07/02/2023   NEUTROABS 9.2 (H) 07/02/2023   HGB 8.0 (L) 07/02/2023   HCT 26.5 (L) 07/02/2023   MCV 84.9 07/02/2023   PLT 278 07/02/2023

## 2023-07-02 ENCOUNTER — Inpatient Hospital Stay

## 2023-07-02 ENCOUNTER — Inpatient Hospital Stay (HOSPITAL_BASED_OUTPATIENT_CLINIC_OR_DEPARTMENT_OTHER): Admitting: Nurse Practitioner

## 2023-07-02 VITALS — BP 130/88 | HR 62 | Temp 97.8°F | Resp 17

## 2023-07-02 DIAGNOSIS — D500 Iron deficiency anemia secondary to blood loss (chronic): Secondary | ICD-10-CM

## 2023-07-02 DIAGNOSIS — I780 Hereditary hemorrhagic telangiectasia: Secondary | ICD-10-CM

## 2023-07-02 DIAGNOSIS — Z95828 Presence of other vascular implants and grafts: Secondary | ICD-10-CM

## 2023-07-02 DIAGNOSIS — Z5112 Encounter for antineoplastic immunotherapy: Secondary | ICD-10-CM | POA: Diagnosis not present

## 2023-07-02 LAB — CBC WITH DIFFERENTIAL (CANCER CENTER ONLY)
Abs Immature Granulocytes: 0.05 10*3/uL (ref 0.00–0.07)
Basophils Absolute: 0 10*3/uL (ref 0.0–0.1)
Basophils Relative: 0 %
Eosinophils Absolute: 0.1 10*3/uL (ref 0.0–0.5)
Eosinophils Relative: 1 %
HCT: 26.5 % — ABNORMAL LOW (ref 36.0–46.0)
Hemoglobin: 8 g/dL — ABNORMAL LOW (ref 12.0–15.0)
Immature Granulocytes: 0 %
Lymphocytes Relative: 13 %
Lymphs Abs: 1.4 10*3/uL (ref 0.7–4.0)
MCH: 25.6 pg — ABNORMAL LOW (ref 26.0–34.0)
MCHC: 30.2 g/dL (ref 30.0–36.0)
MCV: 84.9 fL (ref 80.0–100.0)
Monocytes Absolute: 0.5 10*3/uL (ref 0.1–1.0)
Monocytes Relative: 5 %
Neutro Abs: 9.2 10*3/uL — ABNORMAL HIGH (ref 1.7–7.7)
Neutrophils Relative %: 81 %
Platelet Count: 278 10*3/uL (ref 150–400)
RBC: 3.12 MIL/uL — ABNORMAL LOW (ref 3.87–5.11)
RDW: 21.8 % — ABNORMAL HIGH (ref 11.5–15.5)
WBC Count: 11.4 10*3/uL — ABNORMAL HIGH (ref 4.0–10.5)
nRBC: 0 % (ref 0.0–0.2)

## 2023-07-02 LAB — CMP (CANCER CENTER ONLY)
ALT: 5 U/L (ref 0–44)
AST: 13 U/L — ABNORMAL LOW (ref 15–41)
Albumin: 3.9 g/dL (ref 3.5–5.0)
Alkaline Phosphatase: 77 U/L (ref 38–126)
Anion gap: 6 (ref 5–15)
BUN: 10 mg/dL (ref 8–23)
CO2: 28 mmol/L (ref 22–32)
Calcium: 9.1 mg/dL (ref 8.9–10.3)
Chloride: 106 mmol/L (ref 98–111)
Creatinine: 0.73 mg/dL (ref 0.44–1.00)
GFR, Estimated: >60 mL/min (ref >60)
Glucose, Bld: 108 mg/dL — ABNORMAL HIGH (ref 70–99)
Potassium: 3.5 mmol/L (ref 3.5–5.1)
Sodium: 140 mmol/L (ref 135–145)
Total Bilirubin: 0.3 mg/dL (ref 0.0–1.2)
Total Protein: 7.1 g/dL (ref 6.5–8.1)

## 2023-07-02 LAB — FERRITIN: Ferritin: 77 ng/mL (ref 11–307)

## 2023-07-02 LAB — SAMPLE TO BLOOD BANK

## 2023-07-02 MED ORDER — HEPARIN SOD (PORK) LOCK FLUSH 100 UNIT/ML IV SOLN
500.0000 [IU] | Freq: Once | INTRAVENOUS | Status: AC
Start: 2023-07-02 — End: 2023-07-02
  Administered 2023-07-02: 500 [IU]

## 2023-07-02 MED ORDER — SODIUM CHLORIDE 0.9% FLUSH
10.0000 mL | Freq: Once | INTRAVENOUS | Status: AC
  Administered 2023-07-02: 10 mL

## 2023-07-04 ENCOUNTER — Encounter: Attending: Physical Medicine and Rehabilitation | Admitting: Physical Medicine and Rehabilitation

## 2023-07-04 VITALS — BP 143/80 | HR 67 | Ht 64.0 in | Wt 232.0 lb

## 2023-07-04 DIAGNOSIS — S82851D Displaced trimalleolar fracture of right lower leg, subsequent encounter for closed fracture with routine healing: Secondary | ICD-10-CM | POA: Insufficient documentation

## 2023-07-04 DIAGNOSIS — G894 Chronic pain syndrome: Secondary | ICD-10-CM | POA: Diagnosis not present

## 2023-07-04 DIAGNOSIS — Z79891 Long term (current) use of opiate analgesic: Secondary | ICD-10-CM | POA: Insufficient documentation

## 2023-07-04 DIAGNOSIS — Z5181 Encounter for therapeutic drug level monitoring: Secondary | ICD-10-CM | POA: Insufficient documentation

## 2023-07-04 DIAGNOSIS — I10 Essential (primary) hypertension: Secondary | ICD-10-CM | POA: Insufficient documentation

## 2023-07-04 MED ORDER — GABAPENTIN 100 MG PO CAPS: 100.0000 mg | ORAL_CAPSULE | Freq: Every day | ORAL | 3 refills | Status: DC

## 2023-07-04 MED FILL — Sod Ferric Gluc Cmplx in Sucrose IV Soln 12.5 MG/ML (Fe Eq): INTRAVENOUS | Qty: 20 | Status: AC

## 2023-07-04 NOTE — Progress Notes (Signed)
 Subjective:    Patient ID: Peggy House, female    DOB: 05/04/1960, 63 y.o.   MRN: [id number]  HPI Mrs. Ernest Mallick is a 63 year old woman who p  1) Right ankle pain 2/2 fracture -she has pain -she has been wearing the boot -she takes tylenol PM but this is not enough -she has never had a history of stroke or heart disease -she cannot takes ibuprofen or naproxen due to a hematologic disorder  2) HTN: -BP is 143/80   Pain Inventory Average Pain 10 Pain Right Now 10 My pain is sharp, burning, stabbing, tingling, and aching  In the last 24 hours, has pain interfered with the following? General activity 2 Relation with others 2 Enjoyment of life 2 What TIME of day is your pain at its worst? evening Sleep (in general) Poor  Pain is worse with: unsure Pain improves with: medication Relief from Meds: 10  how many minutes can you walk? 0 ability to climb steps?  no do you drive?  no use a wheelchair needs help with transfers  disabled: date disabled 2013 I need assistance with the following:  household duties  bladder control problems spasms confusion depression anxiety  Hospital fu  Hospital fu    Family History  Problem Relation Age of Onset   Cancer Mother        Ovarian   Ovarian cancer Mother    Diabetes Mother    Kidney disease Mother    Bleeding Disorder Mother    Diabetes type II Sister    Bleeding Disorder Sister    Diabetes Sister    Colon cancer Maternal Grandfather    Crohn's disease Maternal Grandfather    Stomach cancer Maternal Grandmother    Kidney disease Son    Bleeding Disorder Son    Dysmenorrhea Neg Hx    Social History   Socioeconomic History   Marital status: Widowed    Spouse name: Not on file   Number of children: 3   Years of education: 39   Highest education level: Not on file  Occupational History   Not on file  Tobacco Use   Smoking status: Every Day    Current packs/day: 0.50    Average packs/day: 0.5  packs/day for 30.0 years (15.0 ttl pk-yrs)    Types: Cigarettes   Smokeless tobacco: Former    Types: Snuff    Quit date: 79   Tobacco comments:    1/2 PPD  Vaping Use   Vaping status: Never Used  Substance and Sexual Activity   Alcohol use: No    Alcohol/week: 0.0 standard drinks of alcohol   Drug use: No    Comment: last cocaine-2010   Sexual activity: Not Currently    Comment: Hysterectomy  Other Topics Concern   Not on file  Social History Narrative   Not on file   Social Drivers of Health   Financial Resource Strain: Not on file  Food Insecurity: No Food Insecurity (04/13/2023)   Hunger Vital Sign    Worried About Running Out of Food in the Last Year: Never true    Ran Out of Food in the Last Year: Never true  Transportation Needs: No Transportation Needs (04/13/2023)   PRAPARE - Administrator, Civil Service (Medical): No    Lack of Transportation (Non-Medical): No  Recent Concern: Transportation Needs - Unmet Transportation Needs (01/31/2023)   PRAPARE - Administrator, Civil Service (Medical): Yes  Lack of Transportation (Non-Medical): Yes  Physical Activity: Not on file  Stress: Not on file  Social Connections: Not on file   Past Surgical History:  Procedure Laterality Date   ABDOMINAL HYSTERECTOMY     CARPAL TUNNEL RELEASE  05/13/2011   Procedure: CARPAL TUNNEL RELEASE;  Surgeon: Mable Paris, MD;  Location: [facility];  Service: Orthopedics;  Laterality: Left;   COLONOSCOPY N/A 03/02/2020   Procedure: COLONOSCOPY;  Surgeon: Bernette Redbird, MD;  Location: [facility] END[facility]COPY;  Service: Endoscopy;  Laterality: N/A;   COLONOSCOPY WITH PROPOFOL N/A 04/28/2014   Procedure: COLONOSCOPY WITH PROPOFOL;  Surgeon: Florencia Reasons, MD;  Location: [facility] ENDOSCOPY;  Service: Endoscopy;  Laterality: N/A;   DG TOES*L*  2/10   rt   DILATION AND CURETTAGE OF UTERUS     ENTEROSCOPY N/A 10/17/2017   Procedure: ENTEROSCOPY;  Surgeon:  Kathi Der, MD;  Location: [facility]COPY;  Service: Gastroenterology;  Laterality: N/A;   ESOPHAGOGASTRODUODENOSCOPY N/A 04/10/2014   Procedure: ESOPHAGOGASTRODUODENOSCOPY (EGD);  Surgeon: Shirley Friar, MD;  Location: [facility] ENDOSCOPY;  Service: Endoscopy;  Laterality: N/A;   ESOPHAGOGASTRODUODENOSCOPY N/A 05/10/2017   Procedure: ESOPHAGOGASTRODUODENOSCOPY (EGD);  Surgeon: Bernette Redbird, MD;  Location: [facility] ENDOSCOPY;  Service: Endoscopy;  Laterality: N/A;   ESOPHAGOGASTRODUODENOSCOPY N/A 09/22/2017   Procedure: ESOPHAGOGASTRODUODENOSCOPY (EGD);  Surgeon: Vida Rigger, MD;  Location: [facility] ENDOSCOPY;  Service: Endoscopy;  Laterality: N/A;  bedside   ESOPHAGOGASTRODUODENOSCOPY N/A 03/02/2020   Procedure: ESOPHAGOGASTRODUODENOSCOPY (EGD);  Surgeon: Bernette Redbird, MD;  Location: [facility];  Service: Endoscopy;  Laterality: N/A;   ESOPHAGOGASTRODUODENOSCOPY N/A 11/03/2020   Procedure: ESOPHAGOGASTRODUODENOSCOPY (EGD);  Surgeon: Charlott Rakes, MD;  Location: [facility] ENDOSCOPY;  Service: Endoscopy;  Laterality: N/A;   ESOPHAGOGASTRODUODENOSCOPY N/A 04/12/2022   Procedure: ESOPHAGOGASTRODUODENOSCOPY (EGD);  Surgeon: Vida Rigger, MD;  Location: [facility] ENDOSCOPY;  Service: Gastroenterology;  Laterality: N/A;   ESOPHAGOGASTRODUODENOSCOPY N/A 05/27/2022   Procedure: ESOPHAGOGASTRODUODENOSCOPY (EGD);  Surgeon: Willis Modena, MD;  Location: [facility]OSCOPY;  Service: Gastroenterology;  Laterality: N/A;   ESOPHAGOGASTRODUODENOSCOPY N/A 09/27/2022   Procedure: ESOPHAGOGASTRODUODENOSCOPY (EGD);  Surgeon: Lynann Bologna, DO;  Location: [facility] ENDOSCOPY;  Service: Gastroenterology;  Laterality: N/A;   ESOPHAGOGASTRODUODENOSCOPY (EGD) WITH PROPOFOL N/A 04/27/2014   Procedure: ESOPHAGOGASTRODUODENOSCOPY (EGD) WITH PROPOFOL;  Surgeon: Florencia Reasons, MD;  Location: [facility] ENDOSCOPY;  Service: Endoscopy;  Laterality: N/A;  possible apc   ESOPHAGOGASTRODUODENOSCOPY (EGD) WITH PROPOFOL N/A 09/30/2017   Procedure:  ESOPHAGOGASTRODUODENOSCOPY (EGD) WITH PROPOFOL;  Surgeon: Kerin Salen, MD;  Location: [facility] ENDOSCOPY;  Service: Gastroenterology;  Laterality: N/A;   ESOPHAGOGASTRODUODENOSCOPY (EGD) WITH PROPOFOL N/A 10/01/2017   Procedure: ESOPHAGOGASTRODUODENOSCOPY (EGD) WITH PROPOFOL;  Surgeon: Kerin Salen, MD;  Location: [facility] ENDOSCOPY;  Service: Gastroenterology;  Laterality: N/A;   ESOPHAGOGASTRODUODENOSCOPY (EGD) WITH PROPOFOL N/A 10/08/2017   Procedure: ESOPHAGOGASTRODUODENOSCOPY (EGD) WITH PROPOFOL;  Surgeon: Kathi Der, MD;  Location: [facility] ENDOSCOPY;  Service: Gastroenterology;  Laterality: N/A;   ESOPHAGOGASTRODUODENOSCOPY (EGD) WITH PROPOFOL N/A 10/17/2017   Procedure: ESOPHAGOGASTRODUODENOSCOPY (EGD) WITH PROPOFOL;  Surgeon: Kathi Der, MD;  Location: [facility] ENDOSCOPY;  Service: Gastroenterology;  Laterality: N/A;   ESOPHAGOGASTRODUODENOSCOPY (EGD) WITH PROPOFOL N/A 10/19/2017   Procedure: ESOPHAGOGASTRODUODENOSCOPY (EGD) WITH PROPOFOL;  Surgeon: Kathi Der, MD;  Location: [facility]OS[facility];  Service: Gastroenterology;  Laterality: N/A;   ESOPHAGOGASTRODUODENOSCOPY (EGD) WITH PROPOFOL N/A 12/04/2018   Procedure: ESOPHAGOGASTRODUODENOSCOPY (EGD) WITH PROPOFOL;  Surgeon: Charlott Rakes, MD;  Location: [facility] ENDOSCOPY;  Service: Endoscopy;  Laterality: N/A;   GIVENS CAPSULE STUDY N/A 10/02/2017   Procedure: GIVENS CAPSULE STUDY;  Surgeon: Kerin Salen,  MD;  Location: [facility] ENDOSCOPY;  Service: Gastroenterology;  Laterality: N/A;   GIVENS CAPSULE STUDY N/A 10/08/2017   Procedure: GIVENS CAPSULE STUDY;  Surgeon: Kathi Der, MD;  Location: [facility] ENDOSCOPY;  Service: Gastroenterology;  Laterality: N/A;  endoscopic placement of capsule   GIVENS CAPSULE STUDY N/A 03/02/2020   Procedure: GIVENS CAPSULE STUDY;  Surgeon: Bernette Redbird, MD;  Location: [facility];  Service: Endoscopy;  Laterality: N/A;   HEMOSTASIS CLIP PLACEMENT  12/04/2018   Procedure: HEMOSTASIS CLIP PLACEMENT;  Surgeon: Charlott Rakes, MD;   Location: [facility] ENDOS[facility];  Service: Endoscopy;;   HEMOSTASIS CLIP PLACEMENT  05/27/2022   Procedure: HEMOSTASIS CLIP PLACEMENT;  Surgeon: Willis Modena, MD;  Location: [facility];  Service: Gastroenterology;;   HEMOSTASIS CLIP PLACEMENT  09/27/2022   Procedure: HEMOSTASIS CLIP PLACEMENT;  Surgeon: Lynann Bologna, DO;  Location: [facility] ENDOSCOPY;  Service: Gastroenterology;;   HEMOSTASIS CONTROL  05/27/2022   Procedure: HEMOSTASIS CONTROL;  Surgeon: Willis Modena, MD;  Location: [facility];  Service: Gastroenterology;;   HOT HEMOSTASIS N/A 04/27/2014   Procedure: HOT HEMOSTASIS (ARGON PLASMA COAGULATION/BICAP);  Surgeon: Florencia Reasons, MD;  Location: [facility] ENDOSCOPY;  Service: Endoscopy;  Laterality: N/A;   HOT HEMOSTASIS N/A 09/30/2017   Procedure: HOT HEMOSTASIS (ARGON PLASMA COAGULATION/BICAP);  Surgeon: Kerin Salen, MD;  Location: [facility] ENDOSCOPY;  Service: Gastroenterology;  Laterality: N/A;   HOT HEMOSTASIS N/A 10/01/2017   Procedure: HOT HEMOSTASIS (ARGON PLASMA COAGULATION/BICAP);  Surgeon: Kerin Salen, MD;  Location: [facility] ENDOSCOPY;  Service: Gastroenterology;  Laterality: N/A;   HOT HEMOSTASIS N/A 10/17/2017   Procedure: HOT HEMOSTASIS (ARGON PLASMA COAGULATION/BICAP);  Surgeon: Kathi Der, MD;  Location: [facility] ENDOSCOPY;  Service: Gastroenterology;  Laterality: N/A;   HOT HEMOSTASIS N/A 10/19/2017   Procedure: HOT HEMOSTASIS (ARGON PLASMA COAGULATION/BICAP);  Surgeon: Kathi Der, MD;  Location: [facility] ENDOSCOPY;  Service: Gastroenterology;  Laterality: N/A;   HOT HEMOSTASIS N/A 03/02/2020   Procedure: HOT HEMOSTASIS (ARGON PLASMA COAGULATION/BICAP);  Surgeon: Bernette Redbird, MD;  Location: [facility];  Service: Endoscopy;  Laterality: N/A;   HOT HEMOSTASIS N/A 04/12/2022   Procedure: HOT HEMOSTASIS (ARGON PLASMA COAGULATION/BICAP);  Surgeon: Vida Rigger, MD;  Location: [facility];  Service: Gastroenterology;  Laterality: N/A;   IR IMAGING GUIDED PORT INSERTION  07/08/2018   L  shoulder Surgery  2011   ORIF ANKLE FRACTURE Right 04/24/2023   Procedure: OPEN REDUCTION INTERNAL FIXATION (ORIF) ANKLE FRACTURE;  Surgeon: Luci Bank, MD;  Location: [facility];  Service: Orthopedics;  Laterality: Right;   POLYPECTOMY  03/02/2020   Procedure: POLYPECTOMY;  Surgeon: Bernette Redbird, MD;  Location: [facility];  Service: Endoscopy;;   SCLEROTHERAPY  11/03/2020   Procedure: Susa Day;  Surgeon: Charlott Rakes, MD;  Location: [facility] ENDOSCOPY;  Service: Endoscopy;;   SUBMUCOSAL INJECTION  09/22/2017   Procedure: SUBMUCOSAL INJECTION;  Surgeon: Vida Rigger, MD;  Location: [facility] ENDOSCOPY;  Service: Endoscopy;;   SUBMUCOSAL INJECTION  12/04/2018   Procedure: SUBMUCOSAL INJECTION;  Surgeon: Charlott Rakes, MD;  Location: [facility] ENDOSCOPY;  Service: Endoscopy;;   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   BP (!) 143/80   Pulse 67  Ht 5\' 4"  (1.626 m)   Wt 232 lb (105.2 kg)   SpO2 98%   BMI 39.82 kg/m   Opioid Risk Score:   Fall Risk Score:  `1  Depression screen [facility] 2/9     07/04/2023   10:06 AM 06/10/2023    9:47 AM 06/20/2022    2:11 PM 11/21/2021    3:14 PM 07/19/2020    4:18 PM 06/15/2020   11:34 AM 03/01/2019    4:11 PM  Depression screen PHQ 2/9  Decreased Interest 1 3 2 3 2 2 2   Down, Depressed, Hopeless 2 2 3 3 2 2 1   PHQ - 2 Score 3 5 5 6 4 4 3   Altered sleeping 3 3 3 3 3 3 3   Tired, decreased energy 2 3 2 3 2 2 2   Change in appetite 1 3 3 3 2 2 2   Feeling bad or failure about yourself  2 2 3 2 2 2 3   Trouble concentrating 3 2 3 3 2 3 3   Moving slowly or fidgety/restless 0 0 3 2 2 3 1   Suicidal thoughts 1 0 0 0 0 2 0   PHQ-9 Score 15 18 22 22 17 21 17   Difficult doing work/chores Somewhat difficult Somewhat difficult Somewhat difficult  Somewhat difficult Somewhat difficult Extremely dIfficult     Review of Systems  Musculoskeletal:        Spasms  Psychiatric/Behavioral:  Positive for confusion and dysphoric mood. The patient is nervous/anxious.   All other systems reviewed and are negative.     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  -Discussed benefits of exercise in reducing pain. -Discussed following foods that may reduce pain: 1) Ginger (especially studied for arthritis)- reduce leukotriene production to decrease inflammation 2) Blueberries- high in phytonutrients that decrease inflammation 3) Salmon- marine omega-3s reduce joint swelling and pain 4) Pumpkin seeds- reduce inflammation 5) dark chocolate- reduces inflammation 6) turmeric- reduces inflammation 7) tart cherries - reduce pain and stiffness 8) extra virgin olive oil - its compound olecanthal helps to block prostaglandins  9) chili peppers- can be eaten or applied topically via capsaicin 10) mint- helpful for headache, muscle aches, joint pain, and itching 11) garlic- reduces inflammation 12) Green tea- reduces inflammation and oxidative stress, helps with weight loss, may reduce the risk of cancer, recommend Double Green Matcha [place] of Tea daily  Link to  further information on diet for chronic pain: [email or web address].bray.[email or web address]   2) HTN: -BP is 143/80 today.  -Advised checking BP daily at home and logging results to bring into follow-up appointment with PCP and myself. -Reviewed BP meds today.  -Advised regarding healthy foods that can help lower blood pressure and provided with a list: 1) citrus foods- high in vitamins and minerals 2) salmon and other fatty fish - reduces inflammation and oxylipins 3) swiss chard (leafy green)- high level of nitrates 4) pumpkin seeds- one of the best natural sources of magnesium 5)  Beans and lentils- high in fiber, magnesium, and potassium 6) Berries- high in flavonoids 7) Amaranth (whole grain, can be cooked similarly to rice and oats)- high in magnesium and fiber 8) Pistachios- even more effective at reducing BP than other nuts 9) Carrots- high in phenolic compounds that relax blood vessels and reduce inflammation 10) Celery- contain phthalides that relax tissues of arterial walls 11) Tomatoes- can also improve cholesterol and reduce risk of heart disease 12) Broccoli- good source of magnesium, calcium, and potassium 13) Greek yogurt: high in potassium and calcium 14) Herbs and spices: Celery seed, cilantro, saffron, lemongrass, black cumin, ginseng, cinnamon, cardamom, sweet basil, and ginger 15) Chia and flax seeds- also help to lower cholesterol and blood sugar 16) Beets- high levels of nitrates that relax blood vessels  17) spinach and bananas- high in potassium  -Provided lise of supplements that can help with hypertension:  1) magnesium: one high quality brand is Bioptemizers since it contains all 7 types of magnesium, otherwise over the counter magnesium gluconate 400mg  is a good option 2) B vitamins 3) vitamin D 4) potassium 5) CoQ10 6) L-arginine 7) Vitamin C 8) Beetroot -Educated that goal BP is 120/80. -Made goal to  incorporate some of the above foods into diet.

## 2023-07-04 NOTE — Addendum Note (Signed)
 Addended by: Silas Sacramento T on: 07/04/2023 10:47 AM   Modules accepted: Orders

## 2023-07-04 NOTE — Patient Instructions (Signed)
 Foods that may reduce pain: 1) Ginger (especially studied for arthritis)- reduce leukotriene production to decrease inflammation 2) Blueberries- high in phytonutrients that decrease inflammation 3) Salmon- marine omega-3s reduce joint swelling and pain 4) Pumpkin seeds- reduce inflammation 5) dark chocolate- reduces inflammation 6) turmeric- reduces inflammation 7) tart cherries - reduce pain and stiffness 8) extra virgin olive oil - its compound olecanthal helps to block prostaglandins  9) chili peppers- can be eaten or applied topically via capsaicin 10) mint- helpful for headache, muscle aches, joint pain, and itching 11) garlic- reduces inflammation 12) Green tea- reduces inflammation and oxidative stress, helps with weight loss, may reduce the risk of cancer, recommend Double Green Matcha [place] of Tea daily  Link to further information on diet for chronic pain: [email or web address].bray.com/   HTN: -Advised checking BP daily at home and logging results to bring into follow-up appointment with PCP and myself. -Reviewed BP meds today.  -Advised regarding healthy foods that can help lower blood pressure and provided with a list: 1) citrus foods- high in vitamins and minerals 2) salmon and other fatty fish - reduces inflammation and oxylipins 3) swiss chard (leafy green)- high level of nitrates 4) pumpkin seeds- one of the best natural sources of magnesium 5) Beans and lentils- high in fiber, magnesium, and potassium 6) Berries- high in flavonoids 7) Amaranth (whole grain, can be cooked similarly to rice and oats)- high in magnesium and fiber 8) Pistachios- even more effective at reducing BP than other nuts 9) Carrots- high in phenolic compounds that relax blood vessels and reduce inflammation 10) Celery- contain phthalides that relax tissues of arterial walls 11) Tomatoes- can also improve cholesterol and reduce risk of  heart disease 12) Broccoli- good source of magnesium, calcium, and potassium 13) Greek yogurt: high in potassium and calcium 14) Herbs and spices: Celery seed, cilantro, saffron, lemongrass, black cumin, ginseng, cinnamon, cardamom, sweet basil, and ginger 15) Chia and flax seeds- also help to lower cholesterol and blood sugar 16) Beets- high levels of nitrates that relax blood vessels  17) spinach and bananas- high in potassium  -Provided lise of supplements that can help with hypertension:  1) magnesium: one high quality brand is Bioptemizers since it contains all 7 types of magnesium, otherwise over the counter magnesium gluconate 400mg  is a good option 2) B vitamins 3) vitamin D 4) potassium 5) CoQ10 6) L-arginine 7) Vitamin C 8) Beetroot -Educated that goal BP is 120/80. -Made goal to incorporate some of the above foods into diet.

## 2023-07-05 ENCOUNTER — Inpatient Hospital Stay

## 2023-07-06 ENCOUNTER — Encounter: Payer: Self-pay | Admitting: Hematology

## 2023-07-06 ENCOUNTER — Encounter: Payer: Self-pay | Admitting: Nurse Practitioner

## 2023-07-08 ENCOUNTER — Other Ambulatory Visit: Payer: Self-pay | Admitting: Physical Medicine and Rehabilitation

## 2023-07-08 LAB — DRUG TOX MONITOR 1 W/CONF, ORAL FLD
Amphetamines: NEGATIVE ng/mL (ref <10)
Barbiturates: NEGATIVE ng/mL (ref <10)
Benzodiazepines: NEGATIVE ng/mL (ref <0.50)
Buprenorphine: NEGATIVE ng/mL (ref <0.10)
Cocaine: NEGATIVE ng/mL (ref <5.0)
Cotinine: >250 ng/mL — ABNORMAL HIGH (ref <5.0)
Fentanyl: NEGATIVE ng/mL (ref <0.10)
Heroin Metabolite: NEGATIVE ng/mL (ref <1.0)
MARIJUANA: NEGATIVE ng/mL (ref <2.5)
MDMA: NEGATIVE ng/mL (ref <10)
Meprobamate: NEGATIVE ng/mL (ref <2.5)
Methadone: NEGATIVE ng/mL (ref <5.0)
Nicotine Metabolite: POSITIVE ng/mL — AB (ref <5.0)
Opiates: NEGATIVE ng/mL (ref <2.5)
Phencyclidine: NEGATIVE ng/mL (ref <10)
Tapentadol: NEGATIVE ng/mL (ref <5.0)
Tramadol: NEGATIVE ng/mL (ref <5.0)
Zolpidem: NEGATIVE ng/mL (ref <5.0)

## 2023-07-08 LAB — DRUG TOX ALC METAB W/CON, ORAL FLD: Alcohol Metabolite: NEGATIVE ng/mL (ref <25)

## 2023-07-08 MED ORDER — TRAMADOL HCL 50 MG PO TABS: 50.0000 mg | ORAL_TABLET | Freq: Two times a day (BID) | ORAL | 5 refills | Status: DC | PRN

## 2023-07-09 ENCOUNTER — Encounter: Payer: Medicaid Other | Admitting: Student

## 2023-07-10 ENCOUNTER — Inpatient Hospital Stay

## 2023-07-10 ENCOUNTER — Inpatient Hospital Stay: Admitting: Nurse Practitioner

## 2023-07-10 ENCOUNTER — Telehealth: Payer: Self-pay

## 2023-07-10 NOTE — Telephone Encounter (Signed)
 Patient did not show up for her appointments today. Patient stated she was not aware of her appointments today.  Patient will reschedule.

## 2023-07-11 ENCOUNTER — Ambulatory Visit: Payer: Self-pay | Admitting: Student

## 2023-07-12 DIAGNOSIS — S82899A Other fracture of unspecified lower leg, initial encounter for closed fracture: Secondary | ICD-10-CM | POA: Diagnosis not present

## 2023-07-13 ENCOUNTER — Inpatient Hospital Stay (HOSPITAL_COMMUNITY)
Admission: EM | Admit: 2023-07-13 | Discharge: 2023-07-31 | DRG: 464 | Disposition: A | Discharge status: Home Health | Attending: Internal Medicine | Admitting: Internal Medicine

## 2023-07-13 ENCOUNTER — Encounter (HOSPITAL_COMMUNITY): Payer: Self-pay

## 2023-07-13 ENCOUNTER — Emergency Department (HOSPITAL_COMMUNITY)

## 2023-07-13 ENCOUNTER — Other Ambulatory Visit: Payer: Self-pay

## 2023-07-13 DIAGNOSIS — T84629A Infection and inflammatory reaction due to internal fixation device of unspecified bone of leg, initial encounter: Secondary | ICD-10-CM | POA: Diagnosis not present

## 2023-07-13 DIAGNOSIS — Z833 Family history of diabetes mellitus: Secondary | ICD-10-CM

## 2023-07-13 DIAGNOSIS — E876 Hypokalemia: Secondary | ICD-10-CM | POA: Diagnosis not present

## 2023-07-13 DIAGNOSIS — Z90710 Acquired absence of both cervix and uterus: Secondary | ICD-10-CM

## 2023-07-13 DIAGNOSIS — F419 Anxiety disorder, unspecified: Secondary | ICD-10-CM | POA: Diagnosis present

## 2023-07-13 DIAGNOSIS — Y792 Prosthetic and other implants, materials and accessory orthopedic devices associated with adverse incidents: Secondary | ICD-10-CM | POA: Diagnosis present

## 2023-07-13 DIAGNOSIS — E1169 Type 2 diabetes mellitus with other specified complication: Secondary | ICD-10-CM | POA: Diagnosis present

## 2023-07-13 DIAGNOSIS — Z5982 Transportation insecurity: Secondary | ICD-10-CM

## 2023-07-13 DIAGNOSIS — T84624A Infection and inflammatory reaction due to internal fixation device of right fibula, initial encounter: Principal | ICD-10-CM | POA: Diagnosis present

## 2023-07-13 DIAGNOSIS — L089 Local infection of the skin and subcutaneous tissue, unspecified: Secondary | ICD-10-CM | POA: Diagnosis not present

## 2023-07-13 DIAGNOSIS — I780 Hereditary hemorrhagic telangiectasia: Secondary | ICD-10-CM | POA: Diagnosis not present

## 2023-07-13 DIAGNOSIS — E785 Hyperlipidemia, unspecified: Secondary | ICD-10-CM | POA: Diagnosis not present

## 2023-07-13 DIAGNOSIS — B9561 Methicillin susceptible Staphylococcus aureus infection as the cause of diseases classified elsewhere: Secondary | ICD-10-CM | POA: Diagnosis present

## 2023-07-13 DIAGNOSIS — T84629D Infection and inflammatory reaction due to internal fixation device of unspecified bone of leg, subsequent encounter: Secondary | ICD-10-CM | POA: Diagnosis not present

## 2023-07-13 DIAGNOSIS — K219 Gastro-esophageal reflux disease without esophagitis: Secondary | ICD-10-CM | POA: Diagnosis not present

## 2023-07-13 DIAGNOSIS — M62838 Other muscle spasm: Secondary | ICD-10-CM | POA: Diagnosis not present

## 2023-07-13 DIAGNOSIS — D62 Acute posthemorrhagic anemia: Secondary | ICD-10-CM | POA: Diagnosis present

## 2023-07-13 DIAGNOSIS — Z79899 Other long term (current) drug therapy: Secondary | ICD-10-CM

## 2023-07-13 DIAGNOSIS — T8130XA Disruption of wound, unspecified, initial encounter: Secondary | ICD-10-CM | POA: Diagnosis not present

## 2023-07-13 DIAGNOSIS — Z886 Allergy status to analgesic agent status: Secondary | ICD-10-CM

## 2023-07-13 DIAGNOSIS — R609 Edema, unspecified: Secondary | ICD-10-CM | POA: Diagnosis not present

## 2023-07-13 DIAGNOSIS — L03115 Cellulitis of right lower limb: Secondary | ICD-10-CM | POA: Diagnosis present

## 2023-07-13 DIAGNOSIS — I10 Essential (primary) hypertension: Secondary | ICD-10-CM | POA: Diagnosis not present

## 2023-07-13 DIAGNOSIS — T8131XA Disruption of external operation (surgical) wound, not elsewhere classified, initial encounter: Secondary | ICD-10-CM | POA: Diagnosis present

## 2023-07-13 DIAGNOSIS — Z888 Allergy status to other drugs, medicaments and biological substances status: Secondary | ICD-10-CM

## 2023-07-13 DIAGNOSIS — Z8041 Family history of malignant neoplasm of ovary: Secondary | ICD-10-CM

## 2023-07-13 DIAGNOSIS — F418 Other specified anxiety disorders: Secondary | ICD-10-CM | POA: Diagnosis not present

## 2023-07-13 DIAGNOSIS — T84622A Infection and inflammatory reaction due to internal fixation device of right tibia, initial encounter: Secondary | ICD-10-CM | POA: Diagnosis present

## 2023-07-13 DIAGNOSIS — D649 Anemia, unspecified: Secondary | ICD-10-CM | POA: Diagnosis not present

## 2023-07-13 DIAGNOSIS — E1152 Type 2 diabetes mellitus with diabetic peripheral angiopathy with gangrene: Secondary | ICD-10-CM | POA: Diagnosis not present

## 2023-07-13 DIAGNOSIS — S91001A Unspecified open wound, right ankle, initial encounter: Secondary | ICD-10-CM | POA: Diagnosis not present

## 2023-07-13 DIAGNOSIS — E119 Type 2 diabetes mellitus without complications: Secondary | ICD-10-CM | POA: Diagnosis not present

## 2023-07-13 DIAGNOSIS — F32A Depression, unspecified: Secondary | ICD-10-CM | POA: Diagnosis present

## 2023-07-13 DIAGNOSIS — T8469XA Infection and inflammatory reaction due to internal fixation device of other site, initial encounter: Secondary | ICD-10-CM | POA: Diagnosis not present

## 2023-07-13 DIAGNOSIS — M86271 Subacute osteomyelitis, right ankle and foot: Secondary | ICD-10-CM | POA: Diagnosis not present

## 2023-07-13 DIAGNOSIS — M7989 Other specified soft tissue disorders: Secondary | ICD-10-CM | POA: Diagnosis not present

## 2023-07-13 DIAGNOSIS — Z841 Family history of disorders of kidney and ureter: Secondary | ICD-10-CM

## 2023-07-13 DIAGNOSIS — T797XXA Traumatic subcutaneous emphysema, initial encounter: Secondary | ICD-10-CM | POA: Diagnosis not present

## 2023-07-13 DIAGNOSIS — R040 Epistaxis: Secondary | ICD-10-CM | POA: Diagnosis not present

## 2023-07-13 DIAGNOSIS — Z91018 Allergy to other foods: Secondary | ICD-10-CM

## 2023-07-13 DIAGNOSIS — G4700 Insomnia, unspecified: Secondary | ICD-10-CM | POA: Diagnosis not present

## 2023-07-13 DIAGNOSIS — F17210 Nicotine dependence, cigarettes, uncomplicated: Secondary | ICD-10-CM | POA: Diagnosis present

## 2023-07-13 DIAGNOSIS — Z4789 Encounter for other orthopedic aftercare: Secondary | ICD-10-CM | POA: Diagnosis not present

## 2023-07-13 DIAGNOSIS — S91001S Unspecified open wound, right ankle, sequela: Secondary | ICD-10-CM

## 2023-07-13 DIAGNOSIS — Z800 Family history of malignant neoplasm of digestive organs: Secondary | ICD-10-CM

## 2023-07-13 DIAGNOSIS — Z95828 Presence of other vascular implants and grafts: Secondary | ICD-10-CM

## 2023-07-13 DIAGNOSIS — M869 Osteomyelitis, unspecified: Secondary | ICD-10-CM

## 2023-07-13 DIAGNOSIS — S91001D Unspecified open wound, right ankle, subsequent encounter: Secondary | ICD-10-CM | POA: Diagnosis not present

## 2023-07-13 DIAGNOSIS — M25571 Pain in right ankle and joints of right foot: Secondary | ICD-10-CM | POA: Diagnosis not present

## 2023-07-13 DIAGNOSIS — M86171 Other acute osteomyelitis, right ankle and foot: Secondary | ICD-10-CM | POA: Diagnosis not present

## 2023-07-13 DIAGNOSIS — Z832 Family history of diseases of the blood and blood-forming organs and certain disorders involving the immune mechanism: Secondary | ICD-10-CM

## 2023-07-13 LAB — CBC WITH DIFFERENTIAL/PLATELET
Abs Immature Granulocytes: 0.14 10*3/uL — ABNORMAL HIGH (ref 0.00–0.07)
Basophils Absolute: 0 10*3/uL (ref 0.0–0.1)
Basophils Relative: 0 %
Eosinophils Absolute: 0 10*3/uL (ref 0.0–0.5)
Eosinophils Relative: 0 %
HCT: 21.2 % — ABNORMAL LOW (ref 36.0–46.0)
Hemoglobin: 6.1 g/dL — CL (ref 12.0–15.0)
Immature Granulocytes: 1 %
Lymphocytes Relative: 5 %
Lymphs Abs: 0.9 10*3/uL (ref 0.7–4.0)
MCH: 24.4 pg — ABNORMAL LOW (ref 26.0–34.0)
MCHC: 28.8 g/dL — ABNORMAL LOW (ref 30.0–36.0)
MCV: 84.8 fL (ref 80.0–100.0)
Monocytes Absolute: 0.9 10*3/uL (ref 0.1–1.0)
Monocytes Relative: 5 %
Neutro Abs: 16.4 10*3/uL — ABNORMAL HIGH (ref 1.7–7.7)
Neutrophils Relative %: 89 %
Platelets: 333 10*3/uL (ref 150–400)
RBC: 2.5 MIL/uL — ABNORMAL LOW (ref 3.87–5.11)
RDW: 21.7 % — ABNORMAL HIGH (ref 11.5–15.5)
Smear Review: NORMAL
WBC: 18.5 10*3/uL — ABNORMAL HIGH (ref 4.0–10.5)
nRBC: 0 % (ref 0.0–0.2)

## 2023-07-13 LAB — BASIC METABOLIC PANEL WITH GFR
Anion gap: 11 (ref 5–15)
BUN: 8 mg/dL (ref 8–23)
CO2: 22 mmol/L (ref 22–32)
Calcium: 9 mg/dL (ref 8.9–10.3)
Chloride: 107 mmol/L (ref 98–111)
Creatinine, Ser: 0.78 mg/dL (ref 0.44–1.00)
GFR, Estimated: >60 mL/min (ref >60)
Glucose, Bld: 96 mg/dL (ref 70–99)
Potassium: 3.1 mmol/L — ABNORMAL LOW (ref 3.5–5.1)
Sodium: 140 mmol/L (ref 135–145)

## 2023-07-13 LAB — PREPARE RBC (CROSSMATCH)

## 2023-07-13 LAB — SEDIMENTATION RATE: Sed Rate: 134 mm/h — ABNORMAL HIGH (ref 0–22)

## 2023-07-13 LAB — C-REACTIVE PROTEIN: CRP: 30 mg/dL — ABNORMAL HIGH (ref <1.0)

## 2023-07-13 MED ORDER — OXYCODONE HCL 5 MG PO TABS
5.0000 mg | ORAL_TABLET | Freq: Once | ORAL | Status: AC
  Administered 2023-07-13: 5 mg via ORAL
  Filled 2023-07-13: qty 1

## 2023-07-13 MED ORDER — OXYCODONE HCL 5 MG PO TABS
5.0000 mg | ORAL_TABLET | ORAL | Status: DC | PRN
  Administered 2023-07-13 – 2023-07-14 (×4): 5 mg via ORAL
  Filled 2023-07-13 (×4): qty 1

## 2023-07-13 MED ORDER — ACETAMINOPHEN 650 MG RE SUPP: 650.0000 mg | Freq: Four times a day (QID) | RECTAL | Status: DC | PRN

## 2023-07-13 MED ORDER — SODIUM CHLORIDE 0.9 % IV SOLN
2.0000 g | INTRAVENOUS | Status: DC
  Administered 2023-07-13 – 2023-07-15 (×3): 2 g via INTRAVENOUS
  Filled 2023-07-13 (×3): qty 20

## 2023-07-13 MED ORDER — ACETAMINOPHEN 325 MG PO TABS
650.0000 mg | ORAL_TABLET | Freq: Four times a day (QID) | ORAL | Status: DC | PRN
  Administered 2023-07-14 (×2): 650 mg via ORAL
  Filled 2023-07-13 (×2): qty 2

## 2023-07-13 MED ORDER — CITALOPRAM HYDROBROMIDE 20 MG PO TABS
20.0000 mg | ORAL_TABLET | Freq: Every day | ORAL | Status: DC
  Administered 2023-07-14 – 2023-07-31 (×18): 20 mg via ORAL
  Filled 2023-07-13 (×18): qty 1

## 2023-07-13 MED ORDER — SODIUM CHLORIDE 0.9% FLUSH
10.0000 mL | Freq: Two times a day (BID) | INTRAVENOUS | Status: AC
Start: 2023-07-13 — End: ?
  Administered 2023-07-14 – 2023-07-31 (×27): 10 mL

## 2023-07-13 MED ORDER — MORPHINE SULFATE (PF) 4 MG/ML IV SOLN
4.0000 mg | Freq: Once | INTRAVENOUS | Status: AC
  Administered 2023-07-13: 4 mg via INTRAVENOUS
  Filled 2023-07-13: qty 1

## 2023-07-13 MED ORDER — OXYCODONE HCL 5 MG PO TABS
5.0000 mg | ORAL_TABLET | ORAL | Status: DC | PRN
  Filled 2023-07-13: qty 1

## 2023-07-13 MED ORDER — CHLORHEXIDINE GLUCONATE CLOTH 2 % EX PADS
6.0000 | MEDICATED_PAD | Freq: Every day | CUTANEOUS | Status: DC
  Administered 2023-07-14 – 2023-07-31 (×18): 6 via TOPICAL

## 2023-07-13 MED ORDER — TRAZODONE HCL 50 MG PO TABS
50.0000 mg | ORAL_TABLET | Freq: Every evening | ORAL | Status: DC | PRN
  Administered 2023-07-16 – 2023-07-30 (×11): 50 mg via ORAL
  Filled 2023-07-13 (×11): qty 1

## 2023-07-13 MED ORDER — PANTOPRAZOLE SODIUM 40 MG PO TBEC
40.0000 mg | DELAYED_RELEASE_TABLET | Freq: Every day | ORAL | Status: DC
  Administered 2023-07-14 – 2023-07-31 (×18): 40 mg via ORAL
  Filled 2023-07-13 (×18): qty 1

## 2023-07-13 MED ORDER — SODIUM CHLORIDE 0.9% IV SOLUTION: Freq: Once | INTRAVENOUS | Status: DC

## 2023-07-13 MED ORDER — VANCOMYCIN HCL 2000 MG/400ML IV SOLN
2000.0000 mg | Freq: Once | INTRAVENOUS | Status: AC
  Administered 2023-07-13: 2000 mg via INTRAVENOUS
  Filled 2023-07-13: qty 400

## 2023-07-13 MED ORDER — VANCOMYCIN HCL 1500 MG/300ML IV SOLN
1500.0000 mg | INTRAVENOUS | Status: DC
  Administered 2023-07-15 – 2023-07-16 (×3): 1500 mg via INTRAVENOUS
  Filled 2023-07-13 (×3): qty 300

## 2023-07-13 MED ORDER — SENNOSIDES-DOCUSATE SODIUM 8.6-50 MG PO TABS: 1.0000 | ORAL_TABLET | Freq: Every evening | ORAL | Status: DC | PRN

## 2023-07-13 MED ORDER — GABAPENTIN 100 MG PO CAPS
100.0000 mg | ORAL_CAPSULE | Freq: Every day | ORAL | Status: DC
  Administered 2023-07-14 – 2023-07-30 (×17): 100 mg via ORAL
  Filled 2023-07-13 (×17): qty 1

## 2023-07-13 MED ORDER — SODIUM CHLORIDE 0.9% FLUSH
10.0000 mL | INTRAVENOUS | Status: DC | PRN
  Administered 2023-07-25: 10 mL

## 2023-07-13 MED ORDER — ACETAMINOPHEN 500 MG PO TABS
1000.0000 mg | ORAL_TABLET | Freq: Once | ORAL | Status: AC
  Administered 2023-07-13: 1000 mg via ORAL
  Filled 2023-07-13: qty 2

## 2023-07-13 MED ORDER — AMINOCAPROIC ACID 500 MG PO TABS
1000.0000 mg | ORAL_TABLET | Freq: Two times a day (BID) | ORAL | Status: DC
  Administered 2023-07-13 – 2023-07-31 (×36): 1000 mg via ORAL
  Filled 2023-07-13 (×37): qty 2

## 2023-07-13 MED ORDER — ONDANSETRON HCL 4 MG/2ML IJ SOLN
4.0000 mg | Freq: Once | INTRAMUSCULAR | Status: AC
  Administered 2023-07-13: 4 mg via INTRAVENOUS
  Filled 2023-07-13: qty 2

## 2023-07-13 NOTE — H&P (Cosign Needed Addendum)
 Date: 07/13/2023               Patient Name:  Peggy House MRN: [id number]  DOB: October 25, 1960 Age / Sex: 63 y.o., female   PCP: Olegario Messier, MD         Medical Service: Internal Medicine Teaching Service         Attending Physician: Dr. Ninetta Lights, Lacretia Leigh, MD      First Contact: Dr. [name], MD     Second Contact: Dr. Marrianne Mood, MD         After Hours (After 5p/  First Contact Pager: [phone]  weekends / holidays): Second Contact Pager: [phone]   SUBJECTIVE   Chief Complaint: right ankle swelling and pain  History of Present Illness: Peggy House is a 63 y.o. female with PMH of HHT, chronic anemia, HTN, T2DM, hx of GI bleed, tobacco use.   Presents with concern for right ankle infection. Hx of repair right bimalleolar ORIF in 04/24/23 in setting of fracture after syncopal episode. Reports for past 1-2 weeks of right ankle swelling and worsening pain. Noted the lateral incision site opened with malodorous drainage for past few days. Pain mainly around the wound sites of the right ankle. Denies calf tenderness.   Denies any n/v. Denies chest or abdominal pain, cough, SOB. Denies dysuria or hematuria. No recent illness or sick contacts.   Hx of HHT. Denies signs of GI bleeding, no melena or hematochezia. Reported nose bleed a few days ago that lasted for 1-2 hours, self resolved with pressure. Denies any lightheadedness or dizzy. Last saw heme/onc on 3/19, on bevacizumab and IV iron. Has port a cath, denies any pain or tenderness around site and has been using it without any issue.    ED Course: Vitals were hemodynamically stable but noted temp 100.4.  Labs significant for Hgb 6.1, WBC 18.5 and K 3.1 Xray of right ankle with diffuse soft tissue swelling and subcutaneous emphysema of lateral hindfoot. Postsurgical changes without complication. No evidence of osteomyelitis.  Received tylenol and oxycodone.  Consulted orthopedics.   Meds:  -Xanax 0.5  mg PRN (not taking) -Amicar 1000 mg BID -amlodipine 10 mg  -citalopram 20 mg  -gabapentin 100 mg at bedtime  -lisinopril 20 mg  -protonix 40 mg  -Proair PRN  -trazodone 50 mg at bedtime PRN   No outpatient medications have been marked as taking for the 07/13/23 encounter [facility] Encounter).    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    Past Surgical History Past Surgical History:  Procedure Laterality Date   ABDOMINAL HYSTERECTOMY     CARPAL TUNNEL RELEASE  05/13/2011   Procedure: CARPAL TUNNEL RELEASE;  Surgeon: Mable Paris, MD;  Location: [facility];  Service: Orthopedics;  Laterality: Left;   COLONOSCOPY N/A 03/02/2020   Procedure: COLONOSCOPY;  Surgeon: Bernette Redbird, MD;  Location: [facility] ENDOSCOPY;  Service: Endoscopy;  Laterality: N/A;   COLONOSCOPY WITH PROPOFOL N/A 04/28/2014   Procedure: COLONOSCOPY WITH PROPOFOL;  Surgeon: Florencia Reasons, MD;  Location: [facility] ENDOSCOPY;  Service: Endoscopy;  Laterality: N/A;   DG TOES*L*  2/10   rt   DILATION AND CURETTAGE OF UTERUS     ENTEROSCOPY N/A 10/17/2017   Procedure: ENTEROSCOPY;  Surgeon: Kathi Der, MD;  Location: [facility] ENDOSCOPY;  Service: Gastroenterology;  Laterality: N/A;   ESOPHAGOGASTRODUODENOSCOPY N/A 04/10/2014   Procedure: ESOPHAGOGASTRODUODENOSCOPY (EGD);  Surgeon: Shirley Friar, MD;  Location: [facility] ENDOSCOPY;  Service: Endoscopy;  Laterality: N/A;   ESOPHAGOGASTRODUODENOSCOPY N/A 05/10/2017   Procedure: ESOPHAGOGASTRODUODENOSCOPY (EGD);  Surgeon: Bernette Redbird, MD;  Location: [facility] ENDOSCOPY;  Service: Endoscopy;   Laterality: N/A;   ESOPHAGOGASTRODUODENOSCOPY N/A 09/22/2017   Procedure: ESOPHAGOGASTRODUODENOSCOPY (EGD);  Surgeon: Vida Rigger, MD;  Location: [facility] ENDOSCOPY;  Service: Endoscopy;  Laterality: N/A;  bedside   ESOPHAGOGASTRODUODENOSCOPY N/A 03/02/2020   Procedure: ESOPHAGOGASTRODUODENOSCOPY (EGD);  Surgeon: Bernette Redbird, MD;  Location: [facility] ENDOSCOPY;  Service: Endoscopy;  Laterality: N/A;   ESOPHAGOGASTRODUODENOSCOPY N/A 11/03/2020   Procedure: ESOPHAGOGASTRODUODENOSCOPY (EGD);  Surgeon: Charlott Rakes, MD;  Location: [facility] ENDOSCOPY;  Service: Endoscopy;  Laterality: N/A;   ESOPHAGOGASTRODUODENOSCOPY N/A 04/12/2022   Procedure: ESOPHAGOGASTRODUODENOSCOPY (EGD);  Surgeon: Vida Rigger, MD;  Location: [facility] ENDOSCOPY;  Service: Gastroenterology;  Laterality: N/A;   ESOPHAGOGASTRODUODENOSCOPY N/A 05/27/2022   Procedure: ESOPHAGOGASTRODUODENOSCOPY (EGD);  Surgeon: Willis Modena, MD;  Location: [facility] ENDOSCOPY;  Service: Gastroenterology;  Laterality: N/A;   ESOPHAGOGASTRODUODENOSCOPY N/A 09/27/2022   Procedure: ESOPHAGOGASTRODUODENOSCOPY (EGD);  Surgeon: Lynann Bologna, DO;  Location: [facility] ENDOSCOPY;  Service: Gastroenterology;  Laterality: N/A;   ESOPHAGOGASTRODUODENOSCOPY (EGD) WITH PROPOFOL N/A 04/27/2014   Procedure: ESOPHAGOGASTRODUODENOSCOPY (EGD) WITH PROPOFOL;  Surgeon: Florencia Reasons, MD;  Location: [facility] ENDOSCOPY;  Service: Endoscopy;  Laterality: N/A;  possible apc   ESOPHAGOGASTRODUODENOSCOPY (EGD) WITH PROPOFOL N/A 09/30/2017   Procedure: ESOPHAGOGASTRODUODENOSCOPY (EGD) WITH PROPOFOL;  Surgeon: Kerin Salen, MD;  Location: [facility] ENDOSCOPY;  Service: Gastroenterology;  Laterality: N/A;   ESOPHAGOGASTRODUODENOSCOPY (EGD) WITH PROPOFOL N/A 10/01/2017   Procedure: ESOPHAGOGASTRODUODENOSCOPY (EGD) WITH PROPOFOL;  Surgeon: Kerin Salen, MD;  Location: [facility] ENDOSCOPY;  Service: Gastroenterology;  Laterality: N/A;   ESOPHAGOGASTRODUODENOSCOPY (EGD) WITH PROPOFOL N/A 10/08/2017   Procedure:  ESOPHAGOGASTRODUODENOSCOPY (EGD) WITH PROPOFOL;  Surgeon: Kathi Der, MD;  Location: [facility] ENDOSCOPY;  Service: Gastroenterology;  Laterality: N/A;   ESOPHAGOGASTRODUODENOSCOPY (EGD) WITH PROPOFOL N/A 10/17/2017   Procedure: ESOPHAGOGASTRODUODENOSCOPY (EGD) WITH PROPOFOL;  Surgeon: Kathi Der, MD;  Location: [facility] ENDOSCOPY;  Service: Gastroenterology;  Laterality: N/A;   ESOPHAGOGASTRODUODENOSCOPY (EGD) WITH PROPOFOL N/A 10/19/2017   Procedure: ESOPHAGOGASTRODUODENOSCOPY (EGD) WITH PROPOFOL;  Surgeon: Kathi Der, MD;  Location: [facility] ENDOSCOPY;  Service: Gastroenterology;  Laterality: N/A;   ESOPHAGOGASTRODUODENOSCOPY (EGD) WITH PROPOFOL N/A 12/04/2018   Procedure: ESOPHAGOGASTRODUODENOSCOPY (EGD) WITH PROPOFOL;  Surgeon: Charlott Rakes, MD;  Location: [facility] ENDOSCOPY;  Service: Endoscopy;  Laterality: N/A;   GIVENS CAPSULE STUDY N/A 10/02/2017   Procedure: GIVENS CAPSULE STUDY;  Surgeon: Kerin Salen, MD;  Location: [facility] ENDOSCOPY;  Service: Gastroenterology;  Laterality: N/A;   GIVENS CAPSULE STUDY N/A 10/08/2017   Procedure: GIVENS CAPSULE STUDY;  Surgeon: Kathi Der, MD;  Location: [facility] ENDOSCOPY;  Service: Gastroenterology;  Laterality: N/A;  endoscopic placement of capsule   GIVENS CAPSULE STUDY N/A 03/02/2020   Procedure: GIVENS CAPSULE STUDY;  Surgeon: Bernette Redbird, MD;  Location: [facility] ENDOSCOPY;  Service: Endoscopy;  Laterality: N/A;   HEMOSTASIS CLIP PLACEMENT  12/04/2018   Procedure: HEMOSTASIS CLIP PLACEMENT;  Surgeon: Charlott Rakes, MD;  Location: [facility] ENDOSCOPY;  Service: Endoscopy;;   HEMOSTASIS CLIP PLACEMENT  05/27/2022   Procedure: HEMOSTASIS CLIP PLACEMENT;  Surgeon: Willis Modena, MD;  Location: [facility] ENDOSCOPY;  Service: Gastroenterology;;   HEMOSTASIS CLIP PLACEMENT  09/27/2022   Procedure: HEMOSTASIS CLIP PLACEMENT;  Surgeon: Lynann Bologna, DO;  Location: [facility] ENDOSCOPY;  Service: Gastroenterology;;   HEMOSTASIS CONTROL  05/27/2022   Procedure: HEMOSTASIS CONTROL;   Surgeon: Willis Modena, MD;  Location: [facility] ENDOSCOPY;  Service: Gastroenterology;;   HOT HEMOSTASIS N/A 04/27/2014   Procedure: HOT HEMOSTASIS (ARGON PLASMA COAGULATION/BICAP);  Surgeon: Florencia Reasons, MD;  Location: [facility] ENDOSCOPY;  Service: Endoscopy;  Laterality: N/A;   HOT HEMOSTASIS N/A 09/30/2017   Procedure: HOT HEMOSTASIS (ARGON PLASMA COAGULATION/BICAP);  Surgeon: Kerin Salen, MD;  Location: [facility] ENDOSCOPY;  Service: Gastroenterology;  Laterality: N/A;   HOT HEMOSTASIS N/A 10/01/2017   Procedure: HOT HEMOSTASIS (ARGON PLASMA COAGULATION/BICAP);  Surgeon: Kerin Salen, MD;  Location: [facility] ENDOSCOPY;  Service: Gastroenterology;  Laterality: N/A;   HOT HEMOSTASIS N/A 10/17/2017   Procedure: HOT HEMOSTASIS (ARGON PLASMA COAGULATION/BICAP);  Surgeon: Kathi Der, MD;  Location: [facility] ENDOSCOPY;  Service: Gastroenterology;  Laterality: N/A;   HOT HEMOSTASIS N/A 10/19/2017   Procedure: HOT HEMOSTASIS (ARGON PLASMA COAGULATION/BICAP);  Surgeon: Kathi Der, MD;  Location: [facility] ENDOSCOPY;  Service: Gastroenterology;  Laterality: N/A;   HOT HEMOSTASIS N/A 03/02/2020   Procedure: HOT HEMOSTASIS (ARGON PLASMA COAGULATION/BICAP);  Surgeon: Bernette Redbird, MD;  Location: [facility] ENDOSCOPY;  Service: Endoscopy;  Laterality: N/A;   HOT HEMOSTASIS N/A 04/12/2022   Procedure: HOT HEMOSTASIS (ARGON PLASMA COAGULATION/BICAP);  Surgeon: Vida Rigger, MD;  Location: [facility] ENDOSCOPY;  Service: Gastroenterology;  Laterality: N/A;   IR IMAGING GUIDED PORT INSERTION  07/08/2018   L shoulder Surgery  2011   ORIF ANKLE FRACTURE Right 04/24/2023   Procedure: OPEN REDUCTION INTERNAL FIXATION (ORIF) ANKLE FRACTURE;  Surgeon: Luci Bank, MD;  Location: [facility] OR;  Service: Orthopedics;  Laterality: Right;   POLYPECTOMY  03/02/2020   Procedure: POLYPECTOMY;  Surgeon: Bernette Redbird, MD;  Location: [facility] ENDOSCOPY;  Service: Endoscopy;;   SCLEROTHERAPY  11/03/2020   Procedure: Susa Day;  Surgeon: Charlott Rakes, MD;  Location:  [facility] ENDOSCOPY;  Service: Endoscopy;;   SUBMUCOSAL INJECTION  09/22/2017   Procedure: SUBMUCOSAL INJECTION;  Surgeon: Vida Rigger, MD;  Location: [facility] ENDOSCOPY;  Service: Endoscopy;;   SUBMUCOSAL INJECTION  12/04/2018   Procedure: SUBMUCOSAL INJECTION;  Surgeon: Charlott Rakes, MD;  Location: [facility] ENDOSCOPY;  Service: Endoscopy;;    Social:  Lives With: son Support: family Level of Function: independent with ADLs, has a walker at home  PCP: Nooruddin, Jason Fila, MD Substances: -Tobacco: 1/2 ppd x 40 years -Alcohol: occasional  -Recreational Drug: denies  Family History:  Family History  Problem Relation Age of Onset   Cancer Mother        Ovarian   Ovarian cancer Mother    Diabetes Mother    Kidney disease Mother    Bleeding Disorder Mother    Diabetes type II Sister    Bleeding Disorder Sister    Diabetes Sister    Colon cancer Maternal Grandfather    Crohn's disease Maternal Grandfather    Stomach cancer Maternal Grandmother    Kidney disease Son    Bleeding Disorder Son    Dysmenorrhea Neg Hx     Allergies: Allergies as of 07/13/2023 - Review Complete 07/13/2023  Allergen Reaction Noted   Feraheme [ferumoxytol] Other (See Comments) 10/09/2017   Nsaids Other (See Comments) 04/25/2014   Tomato Hives 01/01/2012   Iron (ferrous sulfate) [ferrous sulfate er] Other (See Comments) 03/07/2020   Wasp venom Swelling 10/06/2017    Review of Systems: A complete ROS  was negative except as per HPI.   OBJECTIVE:   Physical Exam: Blood pressure 139/65, pulse 84, temperature (!) 100.4 F (38 C), resp. rate 18, height 5\' 4"  (1.626 m), weight 105.2 kg, SpO2 96%.  Constitutional: alert, laying in bed uncomfortable, in no acute distress HENT: normocephalic atraumatic, no bleed in nares Cardiovascular: regular rate and rhythm, systolic murmur, 2+ DP pulse of right foot Pulmonary/Chest: normal work of breathing on room air, lungs clear to auscultation bilaterally Abdominal: bowel sounds  present, soft, non-tender, non-distended MSK: no pitting LE edema, neurovascularly intact of right LE, able to move right ankle and toes, swelling of lateral foot and malleolus Neurological: alert & oriented x 3 Skin: Right Ankle: lateral incision site dehiscence with noted purulent drainage, no bleeding, TTP with surrounding erythema, medial incision site without drainage but TTP        Labs: CBC    Component Value Date/Time   WBC 18.5 (H) 07/13/2023 1735   RBC 2.50 (L) 07/13/2023 1735   HGB 6.1 (LL) 07/13/2023 1735   HGB 8.0 (L) 07/02/2023 1318   HGB 9.7 (L) 05/27/2023 1146   HGB 8.1 (L) 02/05/2017 1407   HCT 21.2 (L) 07/13/2023 1735   HCT 32.9 (L) 05/27/2023 1146   HCT 26.6 (L) 02/05/2017 1407   PLT 333 07/13/2023 1735   PLT 278 07/02/2023 1318   PLT 356 05/27/2023 1146   MCV 84.8 07/13/2023 1735   MCV 87 05/27/2023 1146   MCV 82.8 02/05/2017 1407   MCH 24.4 (L) 07/13/2023 1735   MCHC 28.8 (L) 07/13/2023 1735   RDW 21.7 (H) 07/13/2023 1735   RDW 18.2 (H) 05/27/2023 1146   RDW 22.6 (H) 02/05/2017 1407   LYMPHSABS 0.9 07/13/2023 1735   LYMPHSABS 2.5 05/20/2017 1551   LYMPHSABS 2.6 02/05/2017 1407   MONOABS 0.9 07/13/2023 1735   MONOABS 0.5 02/05/2017 1407   EOSABS 0.0 07/13/2023 1735   EOSABS 0.1 05/20/2017 1551   BASOSABS 0.0 07/13/2023 1735   BASOSABS 0.0 05/20/2017 1551   BASOSABS 0.1 02/05/2017 1407     CMP     Component Value Date/Time   NA 140 07/13/2023 1735   NA 147 (H) 04/02/2017 1708   NA 141 02/05/2017 1407   K 3.1 (L) 07/13/2023 1735   K 3.5 02/05/2017 1407   CL 107 07/13/2023 1735   CO2 22 07/13/2023 1735   CO2 24 02/05/2017 1407   GLUCOSE 96 07/13/2023 1735   GLUCOSE 134 02/05/2017 1407   BUN 8 07/13/2023 1735   BUN 5 (L) 04/02/2017 1708   BUN 9.3 02/05/2017 1407   CREATININE 0.78 07/13/2023 1735   CREATININE 0.73 07/02/2023 1318   CREATININE 0.9 02/05/2017 1407   CALCIUM 9.0 07/13/2023 1735   CALCIUM 8.9 02/05/2017 1407   PROT 7.1  07/02/2023 1318   PROT 6.7 04/02/2017 1708   PROT 7.1 02/05/2017 1407   ALBUMIN 3.9 07/02/2023 1318   ALBUMIN 3.8 04/02/2017 1708   ALBUMIN 3.5 02/05/2017 1407   AST 13 (L) 07/02/2023 1318   AST 13 02/05/2017 1407   ALT 5 07/02/2023 1318   ALT 8 02/05/2017 1407   ALKPHOS 77 07/02/2023 1318   ALKPHOS 84 02/05/2017 1407   BILITOT 0.3 07/02/2023 1318   BILITOT 0.23 02/05/2017 1407   GFRNONAA >60 07/13/2023 1735   GFRNONAA >60 07/02/2023 1318   GFRAA >60 01/06/2020 0921    Imaging:  DG Ankle Complete Right CLINICAL DATA:  Right ankle wound dehiscence and drainage. Recent surgery in early  January. Concern for infection.  EXAM: RIGHT ANKLE - COMPLETE 3+ VIEW  COMPARISON:  Right ankle x-rays dated April 24, 2023.  FINDINGS: Postsurgical changes related to bimalleolar ORIF with syndesmotic fixation. No evidence of hardware failure or loosening. Mild tibiotalar and midfoot degenerative changes. Diffuse soft tissue swelling. Superficial subcutaneous emphysema along the lateral hindfoot.  IMPRESSION: 1. Diffuse soft tissue swelling with superficial subcutaneous emphysema along the lateral hindfoot, presumably related to wound dehiscence. 2. Postsurgical changes without hardware complication. No radiographic evidence of osteomyelitis.  Electronically Signed   By: Obie Dredge M.D.   On: 07/13/2023 17:04   EKG: not obtained in ED  ASSESSMENT & PLAN:   Assessment & Plan by Problem: Principal Problem:   Wound dehiscence   Peggy House is a 63 y.o. person living with a history of HHT, chronic anemia, HTN, T2DM, hx of GI bleed, tobacco use who presented with right ankle pain and admitted for wound dehiscence and anemia on hospital day 0  Right ankle wound dehiscence with purulent drainage and surrounding cellulitis  Hx of bimalleolar ORIF of R ankle (04/24/23) Leukocytosis Presented with right ankle pain with noted lateral incision site dehiscence with  purulent drainage.  Leukocytosis of 18.5 with elevated inflammatory markers of CRP 30 and ESR 134.  Febrile in the ED 200.4.  X-ray did not show any signs of osteomyelitis but noted soft tissue swelling and subcutaneous emphysema.  Given presentation and exam, will start empiric antibiotics.  No other signs of infection, no cough/SOB/respiratory symptoms, no dysuria or urinary symptoms, port-a-cath looks intact and no signs of infection.  -Orthopedics consulted by EDP, will evaluate tomorrow morning, appreciate assistance and follow recs -NPO at [facility] in case of procedure tomorrow -Start vancomycin and ceftriaxone, narrow as indicated -Follow-up blood cultures -Trend fever curve  -Tylenol PRN, oxy 5 mg Q4H PRN  -Continue home gabapentin 100 mg nightly  Acute on chronic anemia  Epistaxis  HHT  Hemoglobin down to 6.1, baseline 7-8.  No overt signs of bleeding at this time.  Noted history of epistaxis.  Denies any GI bleeding signs.  Follows with heme/onc (Dr. Mosetta Putt) for her HHT on bevacizumab and IV iron every 2 weeks, last done on 3/13. Iron studies done earlier this month, ferritin 77, iron 29 and saturation 10%.  -Receiving 1 unit PRBC -Trend CBC -Transfuse if Hgb <8 per outpt hematology notes  -Continue home aminocaproic acid 1000 mg BID -Continue outpatient f/u with hematology/oncology   Hypokalemia K low at 3.1. Replete with oral K.  Checking mag. -Trend BMP, replete electrolytes as needed  Hypertension BP fairly normotensive. PTA on lisinopril and amlodipine. -Hold home antihypertensives for now, restart as indicated if hypertensive  Hx of T2DM, controlled  Last A1c stable at 5.0 in 01/2023, appears well controlled.  Not taking any diabetic medications at this time. -Trend daily glucose, add on SSI if needed  Insomnia: Not taking Xanax at this time for sleep. Continue home trazodone 50 mg nightly as needed. GERD: continue home PPI  Depression: Continue home Celexa 20 mg  daily Tobacco use disorder: can add on nicotine patch if needed    Diet: Heart Healthy VTE: SCDs IVF: None Code: Full (discussed with patient given prior history of DNR/DNI, patient  requests electing for Full code) Surrogate Decision Maker: son Ludger Nutting)  Prior to Admission Living Arrangement: Home, living son Anticipated Discharge Location: Home Barriers to Discharge: wound dehiscence, medical stability   Dispo: Admit patient to Inpatient with expected length of stay  greater than 2 midnights.  Signed: Rana Snare, DO Internal Medicine Resident PGY-2 Pager: [phone] 07/13/2023, 8:45 PM   Please contact on-call pager, weekdays after 5pm and weekends after 1pm, at [phone].

## 2023-07-13 NOTE — Progress Notes (Signed)
 Pharmacy Antibiotic Note  Peggy House is a 63 y.o. female for which pharmacy has been consulted for vancomycin dosing for  wound infection .  Patient with a history of HHT, chronic anemia, HTN, T2DM, hx of GI bleed. Recent repair right bimalleolar ORIF in 04/24/23 in setting of fracture after syncopal episode .  SCr 0.78 WBC 18.5; T 99.3; HR 81; RR 16  Plan: Ceftriaxone per MD Vancomycin 2000 mg once then 1500 mg q24hr (eAUC 503.3) unless change in renal function Monitor WBC, fever, renal function, cultures De-escalate when able Levels at steady state  Height: 5\' 4"  (162.6 cm) Weight: 105.2 kg (231 lb 14.8 oz) IBW/kg (Calculated) : 54.7  Temp (24hrs), Avg:99.6 F (37.6 C), Min:98.6 F (37 C), Max:100.4 F (38 C)  Recent Labs  Lab 07/13/23 1735  WBC 18.5*  CREATININE 0.78    Estimated Creatinine Clearance: 86.2 mL/min (by C-G formula based on SCr of 0.78 mg/dL).    Allergies  Allergen Reactions   Feraheme [Ferumoxytol] Other (See Comments)    Muscle tetany, encephalopathy, fatigue, nausea (reaction to injection)   Nsaids Other (See Comments)    Stomach bleeding episodes; Tylenol is OK   Tomato Hives   Iron (Ferrous Sulfate) [Ferrous Sulfate Er] Other (See Comments)    Shock   Wasp Venom Swelling   Microbiology results: Pending  Thank you for allowing pharmacy to be a part of this patient's care.  Delmar Landau, PharmD, BCPS 07/13/2023 9:42 PM ED Clinical Pharmacist -  [phone]

## 2023-07-13 NOTE — Progress Notes (Signed)
 Patient arrived to unit alert and oriented. Receiving blood transfusion.    0310: Received call from lab with critical hemoglobin of 6.7. Provider notified and another unit of blood ordered. Patient with a temp of 101.7, PRN trylenol given. Will recheck in an hour to see if temp appropriate for blood transfusion.  0630: Patients temp down 100.4, provider notified and stated that would be an acceptable temp to transfuse. Day shift RN made aware of need for transfusion.

## 2023-07-13 NOTE — ED Triage Notes (Signed)
 Pt arrived via [facility] from home for c/o right foot anklex3d. Pt had surg on that ankle a month ago. Pt told EMS the wound dehisced last night and pus came out. Pt's wound measure 2cmx0.8cm on right inner ankle. The bedding of the wound is light green. Pt has drainage coming out of wound, but hard to tell what color, because there's not a bandage on it.

## 2023-07-13 NOTE — ED Provider Notes (Signed)
 [place] EMERGENCY DEPARTMENT AT [facility] Provider Note   CSN: [id number] Arrival date & time: 07/13/23  1532     History  Chief Complaint  Patient presents with   Wound Check    DERRIAN RODAK is a 63 y.o. female.  63 yo F with a chief complaint of concern for infection to her right ankle.  The patient had a right ankle repair done on January 9.  She said that has been really hurting her for the past 3 days or so.  She denies any injury to it.  This morning noticed that her wounds had opened.  She been having some drainage coming out of the wound.  No fevers.   Wound Check       Home Medications Prior to Admission medications   Medication Sig Start Date End Date Taking? Authorizing Provider  Accu-Chek Softclix Lancets lancets Use to check blood sugar before breakfast and before dinner while on steroids Patient taking differently: 1 each by Other route See admin instructions. Use to check blood sugar before breakfast and before dinner while on steroids 09/27/19   Claudean Severance, MD  acetaminophen (TYLENOL) 325 MG tablet Take 2 tablets (650 mg total) by mouth every 6 (six) hours as needed. 05/19/23   Raulkar, Drema Pry, MD  ALPRAZolam Prudy Feeler) 0.5 MG tablet TAKE 1 TABLET BY MOUTH AT BEDTIME AS NEEDED FOR ANXIETY OR SLEEP 12/07/22   Malachy Mood, MD  aminocaproic acid (AMICAR) 500 MG tablet Take 2 tablets (1,000 mg total) by mouth every 12 (twelve) hours. 02/21/23   Carlean Jews, NP  amLODipine (NORVASC) 10 MG tablet Take 1 tablet (10 mg total) by mouth daily. 06/10/23 06/09/24  Annett Fabian, MD  citalopram (CELEXA) 20 MG tablet Take 1 tablet (20 mg total) by mouth daily. 06/20/22 06/20/23  Nooruddin, Jason Fila, MD  diclofenac Sodium (VOLTAREN ARTHRITIS PAIN) 1 % GEL Apply 4 g topically 4 (four) times daily. 07/01/23   Nooruddin, Jason Fila, MD  gabapentin (NEURONTIN) 100 MG capsule Take 1 capsule (100 mg total) by mouth at bedtime. 07/04/23   Raulkar, Drema Pry, MD   glucose blood (ACCU-CHEK GUIDE) test strip Check blood sugar 2 times per day while on steroids before breakfast and dinner Patient taking differently: 1 each by Other route See admin instructions. Check blood sugar 2 times per day while on steroids before breakfast and dinner 09/27/19   Claudean Severance, MD  lisinopril (ZESTRIL) 20 MG tablet Take 1 tablet (20 mg total) by mouth daily. 05/27/23   Annett Fabian, MD  lisinopril (ZESTRIL) 20 MG tablet Take 1 tablet (20 mg total) by mouth daily. 06/23/23   Nooruddin, Jason Fila, MD  pantoprazole (PROTONIX) 40 MG tablet TAKE 1 TABLET(40 MG) BY MOUTH TWICE DAILY Patient taking differently: Take 40 mg by mouth daily. 09/30/22   Malachy Mood, MD  PROAIR HFA [phone] Base) MCG/ACT inhaler INHALE 1 TO 2 PUFFS INTO THE LUNGS EVERY 6 HOURS AS NEEDED FOR WHEEZING OR SHORTNESS OF BREATH Patient taking differently: Inhale 1-2 puffs into the lungs every 6 (six) hours as needed for wheezing or shortness of breath. 11/10/20   Malachy Mood, MD  senna-docusate (SENOKOT-S) 8.6-50 MG tablet Take 1 tablet by mouth at bedtime. 05/19/23   Raulkar, Drema Pry, MD  traMADol (ULTRAM) 50 MG tablet Take 1 tablet (50 mg total) by mouth every 12 (twelve) hours as needed. 07/08/23 07/07/24  Horton Chin, MD  traZODone (DESYREL) 50 MG tablet Take 1 tablet (50  mg total) by mouth at bedtime as needed for sleep (if). 07/01/23   Nooruddin, Jason Fila, MD      Allergies    Feraheme [ferumoxytol], Nsaids, Tomato, Iron (ferrous sulfate) [ferrous sulfate er], and Wasp venom    Review of Systems   Review of Systems  Physical Exam Updated Vital Signs BP (!) 143/70 (BP Location: Right Arm)   Pulse 81   Temp 100.1 F (37.8 C) (Oral)   Resp 18   Ht 5\' 4"  (1.626 m)   Wt 105.2 kg   SpO2 96%   BMI 39.81 kg/m  Physical Exam Vitals and nursing note reviewed.  Constitutional:      General: She is not in acute distress.    Appearance: She is well-developed. She is not diaphoretic.  HENT:     Head:  Normocephalic and atraumatic.  Eyes:     Pupils: Pupils are equal, round, and reactive to light.  Cardiovascular:     Rate and Rhythm: Normal rate and regular rhythm.     Heart sounds: No murmur heard.    No friction rub. No gallop.  Pulmonary:     Effort: Pulmonary effort is normal.     Breath sounds: No wheezing or rales.  Abdominal:     General: There is no distension.     Palpations: Abdomen is soft.     Tenderness: There is no abdominal tenderness.  Musculoskeletal:        General: No tenderness.     Cervical back: Normal range of motion and neck supple.     Comments: Pulse motor and sensation intact distally.  The patient has a medial and lateral wound that seem to have dehisced.  The leg itself looks like it has not been cleaned in a while.  Skin:    General: Skin is warm and dry.  Neurological:     Mental Status: She is alert and oriented to person, place, and time.  Psychiatric:        Behavior: Behavior normal.        ED Results / Procedures / Treatments   Labs (all labs ordered are listed, but only abnormal results are displayed) Labs Reviewed  CBC WITH DIFFERENTIAL/PLATELET - Abnormal; Notable for the following components:      Result Value   WBC 18.5 (*)    RBC 2.50 (*)    Hemoglobin 6.1 (*)    HCT 21.2 (*)    MCH 24.4 (*)    MCHC 28.8 (*)    RDW 21.7 (*)    Neutro Abs 16.4 (*)    Abs Immature Granulocytes 0.14 (*)    All other components within normal limits  BASIC METABOLIC PANEL WITH GFR - Abnormal; Notable for the following components:   Potassium 3.1 (*)    All other components within normal limits  C-REACTIVE PROTEIN - Abnormal; Notable for the following components:   CRP 30.0 (*)    All other components within normal limits  CULTURE, BLOOD (ROUTINE X 2)  CULTURE, BLOOD (ROUTINE X 2)  SEDIMENTATION RATE  PREPARE RBC (CROSSMATCH)  TYPE AND SCREEN    EKG None  Radiology DG Ankle Complete Right Result Date: 07/13/2023 CLINICAL DATA:   Right ankle wound dehiscence and drainage. Recent surgery in early January. Concern for infection. EXAM: RIGHT ANKLE - COMPLETE 3+ VIEW COMPARISON:  Right ankle x-rays dated April 24, 2023. FINDINGS: Postsurgical changes related to bimalleolar ORIF with syndesmotic fixation. No evidence of hardware failure or loosening. Mild tibiotalar and midfoot  degenerative changes. Diffuse soft tissue swelling. Superficial subcutaneous emphysema along the lateral hindfoot. IMPRESSION: 1. Diffuse soft tissue swelling with superficial subcutaneous emphysema along the lateral hindfoot, presumably related to wound dehiscence. 2. Postsurgical changes without hardware complication. No radiographic evidence of osteomyelitis. Electronically Signed   By: Obie Dredge M.D.   On: 07/13/2023 17:04    Procedures .Critical Care  Performed by: Melene Plan, DO Authorized by: Melene Plan, DO   Critical care provider statement:    Critical care time (minutes):  35   Critical care time was exclusive of:  Separately billable procedures and treating other patients   Critical care was time spent personally by me on the following activities:  Development of treatment plan with patient or surrogate, discussions with consultants, evaluation of patient's response to treatment, examination of patient, ordering and review of laboratory studies, ordering and review of radiographic studies, ordering and performing treatments and interventions, pulse oximetry, re-evaluation of patient's condition and review of old charts   Care discussed with: admitting provider       Medications Ordered in ED Medications  0.9 %  sodium chloride infusion (Manually program via Guardrails IV Fluids) (has no administration in time range)  morphine (PF) 4 MG/ML injection 4 mg (has no administration in time range)  ondansetron (ZOFRAN) injection 4 mg (has no administration in time range)  acetaminophen (TYLENOL) tablet 1,000 mg (1,000 mg Oral Given 07/13/23  1636)  oxyCODONE (Oxy IR/ROXICODONE) immediate release tablet 5 mg (5 mg Oral Given 07/13/23 1636)    ED Course/ Medical Decision Making/ A&P                                 Medical Decision Making Amount and/or Complexity of Data Reviewed Labs: ordered. Radiology: ordered.  Risk OTC drugs. Prescription drug management.   63 yo F with a chief complaints of right ankle pain and swelling and dehiscence of her surgical wound.  Her presentation is concerning for possible infected hardware.  She is afebrile here.  Will obtain a plain film.  Plain film independently interpreted by me without obvious displacement of hardware.  No obvious acute fracture.  She has a leukocytosis.  Acute anemia.  I discussed this with the patient.  She said she had a significant nosebleed about 3 days ago.  She denies any dark stool or blood in her stool.  Denies hematemesis.  Will transfuse a unit.  No significant electrolyte abnormalities.  I discussed the case with Dr. Yevette Edwards, orthopedics.  Ortho to see tomorrow to evaluate ankle.  Recommended holding off on antibiotics acutely.  I discussed with medicine for admission.  The patients results and plan were reviewed and discussed.   Any x-rays performed were independently reviewed by myself.   Differential diagnosis were considered with the presenting HPI.  Medications  0.9 %  sodium chloride infusion (Manually program via Guardrails IV Fluids) (has no administration in time range)  morphine (PF) 4 MG/ML injection 4 mg (has no administration in time range)  ondansetron (ZOFRAN) injection 4 mg (has no administration in time range)  acetaminophen (TYLENOL) tablet 1,000 mg (1,000 mg Oral Given 07/13/23 1636)  oxyCODONE (Oxy IR/ROXICODONE) immediate release tablet 5 mg (5 mg Oral Given 07/13/23 1636)    Vitals:   07/13/23 1540 07/13/23 1545 07/13/23 1811 07/13/23 1922  BP:  134/60 139/65 (!) 143/70  Pulse:  85 84 81  Resp:  20 18 18   Temp:  99.6  F (37.6 C) (!) 100.4 F (38 C) 100.1 F (37.8 C)  TempSrc:  Oral  Oral  SpO2:  100% 96% 96%  Weight: 105.2 kg     Height: 5\' 4"  (1.626 m)       Final diagnoses:  Acute anemia  Wound dehiscence    Admission/ observation were discussed with the admitting physician, patient and/or family and they are comfortable with the plan.          Final Clinical Impression(s) / ED Diagnoses Final diagnoses:  Acute anemia  Wound dehiscence    Rx / DC Orders ED Discharge Orders     None         Melene Plan, DO 07/13/23 1941

## 2023-07-14 ENCOUNTER — Other Ambulatory Visit: Payer: Self-pay

## 2023-07-14 DIAGNOSIS — M86271 Subacute osteomyelitis, right ankle and foot: Secondary | ICD-10-CM | POA: Diagnosis not present

## 2023-07-14 DIAGNOSIS — T8130XA Disruption of wound, unspecified, initial encounter: Secondary | ICD-10-CM | POA: Diagnosis not present

## 2023-07-14 DIAGNOSIS — T84622A Infection and inflammatory reaction due to internal fixation device of right tibia, initial encounter: Secondary | ICD-10-CM | POA: Diagnosis not present

## 2023-07-14 DIAGNOSIS — I780 Hereditary hemorrhagic telangiectasia: Secondary | ICD-10-CM

## 2023-07-14 DIAGNOSIS — M86171 Other acute osteomyelitis, right ankle and foot: Secondary | ICD-10-CM | POA: Diagnosis not present

## 2023-07-14 DIAGNOSIS — F32A Depression, unspecified: Secondary | ICD-10-CM | POA: Diagnosis not present

## 2023-07-14 DIAGNOSIS — E1169 Type 2 diabetes mellitus with other specified complication: Secondary | ICD-10-CM | POA: Diagnosis not present

## 2023-07-14 DIAGNOSIS — I10 Essential (primary) hypertension: Secondary | ICD-10-CM

## 2023-07-14 DIAGNOSIS — D62 Acute posthemorrhagic anemia: Secondary | ICD-10-CM | POA: Diagnosis not present

## 2023-07-14 DIAGNOSIS — E1152 Type 2 diabetes mellitus with diabetic peripheral angiopathy with gangrene: Secondary | ICD-10-CM | POA: Diagnosis not present

## 2023-07-14 DIAGNOSIS — S91001A Unspecified open wound, right ankle, initial encounter: Secondary | ICD-10-CM | POA: Diagnosis not present

## 2023-07-14 DIAGNOSIS — D649 Anemia, unspecified: Secondary | ICD-10-CM | POA: Diagnosis not present

## 2023-07-14 DIAGNOSIS — T8131XA Disruption of external operation (surgical) wound, not elsewhere classified, initial encounter: Secondary | ICD-10-CM | POA: Diagnosis not present

## 2023-07-14 DIAGNOSIS — L03115 Cellulitis of right lower limb: Secondary | ICD-10-CM | POA: Diagnosis not present

## 2023-07-14 DIAGNOSIS — T84624A Infection and inflammatory reaction due to internal fixation device of right fibula, initial encounter: Secondary | ICD-10-CM | POA: Diagnosis not present

## 2023-07-14 LAB — GLUCOSE, CAPILLARY
Glucose-Capillary: 89 mg/dL (ref 70–99)
Glucose-Capillary: 92 mg/dL (ref 70–99)

## 2023-07-14 LAB — BASIC METABOLIC PANEL WITH GFR
Anion gap: 10 (ref 5–15)
BUN: 8 mg/dL (ref 8–23)
CO2: 22 mmol/L (ref 22–32)
Calcium: 8.4 mg/dL — ABNORMAL LOW (ref 8.9–10.3)
Chloride: 108 mmol/L (ref 98–111)
Creatinine, Ser: 0.75 mg/dL (ref 0.44–1.00)
GFR, Estimated: >60 mL/min (ref >60)
Glucose, Bld: 112 mg/dL — ABNORMAL HIGH (ref 70–99)
Potassium: 3.2 mmol/L — ABNORMAL LOW (ref 3.5–5.1)
Sodium: 140 mmol/L (ref 135–145)

## 2023-07-14 LAB — CBC
HCT: 22.7 % — ABNORMAL LOW (ref 36.0–46.0)
Hemoglobin: 6.7 g/dL — CL (ref 12.0–15.0)
MCH: 25.5 pg — ABNORMAL LOW (ref 26.0–34.0)
MCHC: 29.5 g/dL — ABNORMAL LOW (ref 30.0–36.0)
MCV: 86.3 fL (ref 80.0–100.0)
Platelets: 307 10*3/uL (ref 150–400)
RBC: 2.63 MIL/uL — ABNORMAL LOW (ref 3.87–5.11)
RDW: 20.8 % — ABNORMAL HIGH (ref 11.5–15.5)
WBC: 16.5 10*3/uL — ABNORMAL HIGH (ref 4.0–10.5)
nRBC: 0.1 % (ref 0.0–0.2)

## 2023-07-14 LAB — HIV ANTIBODY (ROUTINE TESTING W REFLEX): HIV Screen 4th Generation wRfx: NONREACTIVE

## 2023-07-14 LAB — MAGNESIUM: Magnesium: 2 mg/dL (ref 1.7–2.4)

## 2023-07-14 LAB — HEMOGLOBIN AND HEMATOCRIT, BLOOD
HCT: 24.1 % — ABNORMAL LOW (ref 36.0–46.0)
Hemoglobin: 7.4 g/dL — ABNORMAL LOW (ref 12.0–15.0)

## 2023-07-14 LAB — PREPARE RBC (CROSSMATCH)

## 2023-07-14 MED ORDER — OXYCODONE HCL 5 MG PO TABS
5.0000 mg | ORAL_TABLET | Freq: Once | ORAL | Status: AC
  Administered 2023-07-14: 5 mg via ORAL
  Filled 2023-07-14: qty 1

## 2023-07-14 MED ORDER — SODIUM CHLORIDE 0.9% IV SOLUTION: Freq: Once | INTRAVENOUS | Status: DC

## 2023-07-14 MED ORDER — OXYCODONE HCL 5 MG PO TABS
5.0000 mg | ORAL_TABLET | ORAL | Status: DC | PRN
  Administered 2023-07-15: 5 mg via ORAL
  Filled 2023-07-14 (×3): qty 1

## 2023-07-14 MED ORDER — ACETAMINOPHEN 325 MG PO TABS
650.0000 mg | ORAL_TABLET | Freq: Four times a day (QID) | ORAL | Status: DC
  Administered 2023-07-14 – 2023-07-31 (×62): 650 mg via ORAL
  Filled 2023-07-14 (×65): qty 2

## 2023-07-14 MED ORDER — NICOTINE 14 MG/24HR TD PT24
14.0000 mg | MEDICATED_PATCH | Freq: Every day | TRANSDERMAL | Status: DC
  Administered 2023-07-14 – 2023-07-15 (×2): 14 mg via TRANSDERMAL
  Filled 2023-07-14 (×2): qty 1

## 2023-07-14 MED ORDER — ACETAMINOPHEN 650 MG RE SUPP
650.0000 mg | Freq: Four times a day (QID) | RECTAL | Status: DC
  Filled 2023-07-14: qty 1

## 2023-07-14 MED ORDER — POTASSIUM CHLORIDE CRYS ER 20 MEQ PO TBCR
40.0000 meq | EXTENDED_RELEASE_TABLET | Freq: Once | ORAL | Status: AC
  Administered 2023-07-14: 40 meq via ORAL
  Filled 2023-07-14: qty 2

## 2023-07-14 NOTE — Progress Notes (Signed)
 PT Cancellation Note  Patient Details Name: Peggy House MRN: [id number] DOB: [birth year]   Cancelled Treatment:    Reason Eval/Treat Not Completed: Medical issues which prohibited therapy (Awaiting clarification of right ankle weight bearing prior to evaluation.  Will check back as time allows.)   [name] 07/14/2023, 9:02 AM Doretha Goding M,PT Acute Rehab Services [phone]

## 2023-07-14 NOTE — Evaluation (Signed)
 Physical Therapy Evaluation Patient Details Name: Peggy House MRN: [id number] DOB: October 21, 1960 Today's Date: 07/14/2023  History of Present Illness  Peggy House is a 63 y.o. female admitted 3/30 with dehiscence of her incisions from right ankle ORIF in Jan.  Pt has been weight bearing at times on right foot and developed drainage and cellulitis.  Possible plan for I&D right ankle with wound VAC placement 3/31.   PMH of HHT, chronic anemia, HTN, T2DM, hx of GI bleed, tobacco use,  Right ankle ORIF on 04/24/23 with SNF rehab stay through mid Feb.  Clinical Impression  Pt admitted with above diagnosis. Pt was able to sit EOB x 10 min with CGA with pt performing exercises.  Pt reports she was able to transfer to wheelchair on her own PTA and had a friend help her 3 hours day for 5 days week.  Per chart, pt wasn't compliant with NWB right LE which possibly contributed to current issues. Will follow acutely and progress pt as able. Pt may need post acute rehab < 3 hours day due to lack of support at home and continued NWB right LE.   Pt currently with functional limitations due to the deficits listed below (see PT Problem List). Pt will benefit from acute skilled PT to increase their independence and safety with mobility to allow discharge.           If plan is discharge home, recommend the following: A little help with walking and/or transfers;A little help with bathing/dressing/bathroom;Assistance with cooking/housework;Assist for transportation;Help with stairs or ramp for entrance   Can travel by private vehicle        Equipment Recommendations None recommended by PT  Recommendations for Other Services       Functional Status Assessment Patient has had a recent decline in their functional status and demonstrates the ability to make significant improvements in function in a reasonable and predictable amount of time.     Precautions / Restrictions Precautions Precautions:  Fall Restrictions Weight Bearing Restrictions Per Provider Order: Yes RLE Weight Bearing Per Provider Order: Non weight bearing      Mobility  Bed Mobility Overal bed mobility: Needs Assistance Bed Mobility: Supine to Sit, Sit to Supine     Supine to sit: Min assist, +2 for safety/equipment, Used rails Sit to supine: Min assist, +2 for safety/equipment, Used rails   General bed mobility comments: Pt able to bring LEs to EOB wtih a little assist and then some assist for elevation of trunk. Pt also needed assist to lie down at end of treatment.  Pt scooted to [facility] with min assist of 2 with use of pads to help pt scoot wtih pt weight bearing only on left LE and maintaining NWB right LE.    Transfers                   General transfer comment: Pt refuses to stand today.    Ambulation/Gait                  Stairs            Wheelchair Mobility     Tilt Bed    Modified Rankin (Stroke Patients Only)       Balance Overall balance assessment: Needs assistance Sitting-balance support: No upper extremity supported, Feet supported Sitting balance-Leahy Scale: Good Sitting balance - Comments: Sat a total of 10 min at EOB doing some LE exercises and scooted to [facility] with assist prior to  laying back down. CGA to sit EOB with good safety awareness.                                     Pertinent Vitals/Pain Pain Assessment Pain Assessment: Faces Pain Score: 8  Pain Location: right foot Pain Descriptors / Indicators: Aching, Discomfort, Grimacing, Guarding Pain Intervention(s): Limited activity within patient's tolerance, Monitored during session, Repositioned    Home Living Family/patient expects to be discharged to:: Private residence Living Arrangements: Children Available Help at Discharge: Family;Available PRN/intermittently;Other (Comment) Type of Home: Apartment Home Access: Level entry       Home Layout: One level Home Equipment:  Agricultural consultant (2 wheels);BSC/3in1;Shower seat;Wheelchair - manual      Prior Function Prior Level of Function : Needs assist             Mobility Comments: pt left Rehab in Feb and was using wheelchair, pivot transfer NWB Right LE with RW ADLs Comments: Needed assist with bathing and dressing, friend would help pt a few hours day 5 days week     Extremity/Trunk Assessment   Upper Extremity Assessment Upper Extremity Assessment: Defer to OT evaluation    Lower Extremity Assessment Lower Extremity Assessment: RLE deficits/detail;LLE deficits/detail RLE Deficits / Details: Hip 3+/5, knee 3/5, ankle 0/5 LLE Deficits / Details: hip 3+/5, knee 3+/5, ankle 2+/5 with limitations in dorsiflexion    Cervical / Trunk Assessment Cervical / Trunk Assessment: Normal  Communication   Communication Communication: No apparent difficulties    Cognition Arousal: Alert Behavior During Therapy: WFL for tasks assessed/performed   PT - Cognitive impairments: No apparent impairments                         Following commands: Intact       Cueing Cueing Techniques: Verbal cues, Tactile cues     General Comments General comments (skin integrity, edema, etc.): Drainage and edema right LE    Exercises General Exercises - Lower Extremity Ankle Circles/Pumps: PROM, AAROM, Both, 5 reps, Seated Long Arc Quad: AROM, Both, 5 reps, Seated   Assessment/Plan    PT Assessment Patient needs continued PT services  PT Problem List Decreased strength;Decreased range of motion;Decreased activity tolerance;Decreased balance;Decreased mobility;Decreased knowledge of use of DME;Decreased safety awareness;Decreased knowledge of precautions;Pain;Decreased skin integrity;Obesity       PT Treatment Interventions DME instruction;Functional mobility training;Therapeutic activities;Therapeutic exercise;Balance training;Patient/family education;Wheelchair mobility training    PT Goals (Current  goals can be found in the Care Plan section)  Acute Rehab PT Goals Patient Stated Goal: to go home PT Goal Formulation: With patient Time For Goal Achievement: 07/28/23 Potential to Achieve Goals: Good    Frequency Min 2X/week     Co-evaluation               AM-PAC PT "6 Clicks" Mobility  Outcome Measure Help needed turning from your back to your side while in a flat bed without using bedrails?: A Little Help needed moving from lying on your back to sitting on the side of a flat bed without using bedrails?: A Little Help needed moving to and from a bed to a chair (including a wheelchair)?: Total Help needed standing up from a chair using your arms (e.g., wheelchair or bedside chair)?: Total Help needed to walk in hospital room?: Total Help needed climbing 3-5 steps with a railing? : Total 6 Click Score: 10  End of Session   Activity Tolerance: Patient limited by fatigue;Patient limited by pain Patient left: in bed;with call bell/phone within reach;with bed alarm set Nurse Communication: Mobility status PT Visit Diagnosis: Other abnormalities of gait and mobility (R26.89);Muscle weakness (generalized) (M62.81);Pain Pain - Right/Left: Right Pain - part of body: Ankle and joints of foot    Time: 1503-1530 PT Time Calculation (min) (ACUTE ONLY): 27 min   Charges:   PT Evaluation $PT Eval Moderate Complexity: 1 Mod PT Treatments $Therapeutic Activity: 8-22 mins PT General Charges $$ ACUTE PT VISIT: 1 Visit         Garek Schuneman M,PT Acute Rehab Services [phone]   [name] 07/14/2023, 4:39 PM

## 2023-07-14 NOTE — Consult Note (Signed)
 ORTHOPAEDIC CONSULTATION  REQUESTING PHYSICIAN: Ginnie Smart, MD  Chief Complaint: Right ankle wound  HPI: Peggy House is a 63 y.o. female with PMH of HHT, chronic anemia, HTN, T2DM, hx of GI bleed, tobacco use,  Right ankle ORIF on 04/24/23   Patient was in acute rehab Until early February after which she was discharged.  She is has failed to follow-up in the office since her discharge.  She states she has not been able to follow-up due to transportation issues.  She states she has To be compliant with nonweightbearing but has put weight on her leg while at home.  She reports for past 1-2 weeks of right ankle swelling and worsening pain. Noted the lateral incision site opened with malodorous drainage for past few days. Pain mainly around the wound sites of the right ankle. 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   Past Surgical History:  Procedure Laterality Date   ABDOMINAL HYSTERECTOMY     CARPAL TUNNEL RELEASE  05/13/2011   Procedure: CARPAL TUNNEL RELEASE;  Surgeon: Mable Paris, MD;  Location: [facility];  Service: Orthopedics;  Laterality: Left;   COLONOSCOPY N/A 03/02/2020   Procedure: COLONOSCOPY;  Surgeon: Bernette Redbird, MD;  Location: [facility];  Service: Endoscopy;  Laterality: N/A;   COLONOSCOPY WITH PROPOFOL N/A 04/28/2014   Procedure: COLONOSCOPY WITH PROPOFOL;  Surgeon: Florencia Reasons, MD;  Location: [facility] END[facility]COPY;  Service: Endoscopy;  Laterality: N/A;   DG TOES*L*  2/10   rt   DILATION AND CURETTAGE OF UTERUS     ENTEROSCOPY N/A 10/17/2017    Procedure: ENTEROSCOPY;  Surgeon: Kathi Der, MD;  Location: [facility];  Service: Gastroenterology;  Laterality: N/A;   ESOPHAGOGASTRODUODENOSCOPY N/A 04/10/2014   Procedure: ESOPHAGOGASTRODUODENOSCOPY (EGD);  Surgeon: Shirley Friar, MD;  Location: [facility] ENDOSCOPY;  Service: Endoscopy;  Laterality: N/A;   ESOPHAGOGASTRODUODENOSCOPY N/A 05/10/2017   Procedure: ESOPHAGOGASTRODUODENOSCOPY (EGD);  Surgeon: Bernette Redbird, MD;  Location: [facility] ENDOSCOPY;  Service: Endoscopy;  Laterality: N/A;   ESOPHAGOGASTRODUODENOSCOPY N/A 09/22/2017   Procedure: ESOPHAGOGASTRODUODENOSCOPY (EGD);  Surgeon: Vida Rigger, MD;  Location: [facility] ENDOSCOPY;  Service: Endoscopy;  Laterality: N/A;  bedside   ESOPHAGOGASTRODUODENOSCOPY N/A 03/02/2020   Procedure: ESOPHAGOGASTRODUODENOSCOPY (EGD);  Surgeon: Bernette Redbird, MD;  Location: [facility];  Service: Endoscopy;  Laterality: N/A;   ESOPHAGOGASTRODUODENOSCOPY N/A 11/03/2020   Procedure: ESOPHAGOGASTRODUODENOSCOPY (EGD);  Surgeon: Charlott Rakes, MD;  Location: [facility] ENDOSCOPY;  Service: Endoscopy;  Laterality: N/A;   ESOPHAGOGASTRODUODENOSCOPY N/A 04/12/2022   Procedure: ESOPHAGOGASTRODUODENOSCOPY (EGD);  Surgeon: Vida Rigger, MD;  Location: [facility];  Service: Gastroenterology;  Laterality: N/A;   ESOPHAGOGASTRODUODENOSCOPY N/A 05/27/2022   Procedure: ESOPHAGOGASTRODUODENOSCOPY (EGD);  Surgeon: Willis Modena, MD;  Location: [facility];  Service: Gastroenterology;  Laterality: N/A;   ESOPHAGOGASTRODUODENOSCOPY N/A 09/27/2022   Procedure: ESOPHAGOGASTRODUODENOSCOPY (EGD);  Surgeon: Lynann Bologna, DO;  Location: [facility] ENDOSCOPY;  Service: Gastroenterology;  Laterality: N/A;   ESOPHAGOGASTRODUODENOSCOPY (EGD) WITH PROPOFOL N/A 04/27/2014   Procedure: ESOPHAGOGASTRODUODENOSCOPY (EGD) WITH PROPOFOL;  Surgeon: Florencia Reasons, MD;  [facility];  Service: Endoscopy;  Laterality: N/A;  possible apc   ESOPHAGOGASTRODUODENOSCOPY (EGD) WITH PROPOFOL N/A  09/30/2017   Procedure: ESOPHAGOGASTRODUODENOSCOPY (EGD) WITH PROPOFOL;  Surgeon: Kerin Salen, MD;  Location: [facility] ENDOSCOPY;  Service: Gastroenterology;  Laterality: N/A;   ESOPHAGOGASTRODUODENOSCOPY (EGD) WITH PROPOFOL N/A 10/01/2017   Procedure: ESOPHAGOGASTRODUODENOSCOPY (EGD) WITH PROPOFOL;  Surgeon: Kerin Salen, MD;  Location: [facility] ENDOSCOPY;  Service: Gastroenterology;  Laterality: N/A;   ESOPHAGOGASTRODUODENOSCOPY (EGD) WITH PROPOFOL N/A 10/08/2017   Procedure: ESOPHAGOGASTRODUODENOSCOPY (EGD) WITH PROPOFOL;  Surgeon: Kathi Der, MD;  [facility];  Service: Gastroenterology;  Laterality: N/A;   ESOPHAGOGASTRODUODENOSCOPY (EGD) WITH PROPOFOL N/A 10/17/2017   Procedure: ESOPHAGOGASTRODUODENOSCOPY (EGD) WITH PROPOFOL;  Surgeon: Kathi Der, MD;  [facility];  Service: Gastroenterology;  Laterality: N/A;   ESOPHAGOGASTRODUODENOSCOPY (EGD) WITH PROPOFOL N/A 10/19/2017   Procedure: ESOPHAGOGASTRODUODENOSCOPY (EGD) WITH PROPOFOL;  Surgeon: Kathi Der, MD;  [facility];  Service: Gastroenterology;  Laterality: N/A;   ESOPHAGOGASTRODUODENOSCOPY (EGD) WITH PROPOFOL N/A 12/04/2018   Procedure: ESOPHAGOGASTRODUODENOSCOPY (EGD) WITH PROPOFOL;  Surgeon: Charlott Rakes, MD;  [facility];  Service: Endoscopy;  Laterality: N/A;   GIVENS CAPSULE STUDY N/A 10/02/2017   Procedure: GIVENS CAPSULE STUDY;  Surgeon: Kerin Salen, MD;  Location: [facility] ENDOSCOPY;  Service: Gastroenterology;  Laterality: N/A;   GIVENS CAPSULE STUDY N/A 10/08/2017   Procedure: GIVENS CAPSULE STUDY;  Surgeon: Kathi Der, MD;  [facility];  Service: Gastroenterology;  Laterality: N/A;  endoscopic placement of capsule   GIVENS CAPSULE STUDY N/A 03/02/2020   Procedure: GIVENS CAPSULE STUDY;  Surgeon: Bernette Redbird, MD;  [facility];  Service: Endoscopy;  Laterality: N/A;   HEMOSTASIS CLIP PLACEMENT  12/04/2018   Procedure: HEMOSTASIS CLIP PLACEMENT;  Surgeon:  Charlott Rakes, MD;  [facility];  Service: Endoscopy;;   HEMOSTASIS CLIP PLACEMENT  05/27/2022   Procedure: HEMOSTASIS CLIP PLACEMENT;  Surgeon: Willis Modena, MD;  [facility];  Service: Gastroenterology;;   HEMOSTASIS CLIP PLACEMENT  09/27/2022   Procedure: HEMOSTASIS CLIP PLACEMENT;  Surgeon: Lynann Bologna, DO;  Location: [facility] ENDOSCOPY;  Service: Gastroenterology;;   HEMOSTASIS CONTROL  05/27/2022   Procedure: HEMOSTASIS CONTROL;  Surgeon: Willis Modena, MD;  [facility];  Service: Gastroenterology;;   HOT HEMOSTASIS N/A 04/27/2014   Procedure: HOT HEMOSTASIS (ARGON PLASMA COAGULATION/BICAP);  Surgeon: Florencia Reasons, MD;  Location: [facility] ENDOSCOPY;  Service: Endoscopy;  Laterality: N/A;   HOT HEMOSTASIS N/A 09/30/2017   Procedure: HOT HEMOSTASIS (ARGON PLASMA COAGULATION/BICAP);  Surgeon: Kerin Salen, MD;  Location: [facility] ENDOSCOPY;  Service: Gastroenterology;  Laterality: N/A;   HOT HEMOSTASIS N/A 10/01/2017   Procedure: HOT HEMOSTASIS (ARGON PLASMA COAGULATION/BICAP);  Surgeon: Kerin Salen, MD;  Location: [facility] ENDOSCOPY;  Service: Gastroenterology;  Laterality: N/A;   HOT HEMOSTASIS N/A 10/17/2017   Procedure: HOT HEMOSTASIS (ARGON PLASMA COAGULATION/BICAP);  Surgeon: Kathi Der, MD;  Location: [facility] ENDOSCOPY;  Service: Gastroenterology;  Laterality: N/A;   HOT HEMOSTASIS N/A 10/19/2017   Procedure: HOT HEMOSTASIS (ARGON PLASMA COAGULATION/BICAP);  Surgeon: Kathi Der, MD;  Location: [facility] ENDOSCOPY;  Service: Gastroenterology;  Laterality: N/A;   HOT HEMOSTASIS N/A 03/02/2020   Procedure: HOT HEMOSTASIS (ARGON PLASMA COAGULATION/BICAP);  Surgeon: Bernette Redbird, MD;  Location: [facility];  Service: Endoscopy;  Laterality: N/A;   HOT HEMOSTASIS N/A 04/12/2022   Procedure: HOT HEMOSTASIS (ARGON PLASMA COAGULATION/BICAP);  Surgeon: Vida Rigger, MD;  Location: [facility];  Service: Gastroenterology;  Laterality: N/A;   IR IMAGING GUIDED PORT  INSERTION  07/08/2018   L shoulder Surgery  2011  ORIF ANKLE FRACTURE Right 04/24/2023   Procedure: OPEN REDUCTION INTERNAL FIXATION (ORIF) ANKLE FRACTURE;  Surgeon: Luci Bank, MD;  [facility];  Service: Orthopedics;  Laterality: Right;   POLYPECTOMY  03/02/2020   Procedure: POLYPECTOMY;  Surgeon: Bernette Redbird, MD;  [facility];  Service: Endoscopy;;   SCLEROTHERAPY  11/03/2020   Procedure: Susa Day;  Surgeon: Charlott Rakes, MD;  Location: [facility] END[facility]COPY;  Service: Endoscopy;;   SUBMUCOSAL INJECTION  09/22/2017   Procedure: SUBMUCOSAL INJECTION;  Surgeon: Vida Rigger, MD;  Location: [facility] ENDOSCOPY;  Service: Endoscopy;;   SUBMUCOSAL INJECTION  12/04/2018   Procedure: SUBMUCOSAL INJECTION;  Surgeon: Charlott Rakes, MD;  [facility];  Service: Endoscopy;;   Social History   Socioeconomic History   Marital status: Widowed    Spouse name: Not on file   Number of children: 3   Years of education: 35   Highest education level: Not on file  Occupational History   Not on file  Tobacco Use   Smoking status: Every Day    Current packs/day: 0.50    Average packs/day: 0.5 packs/day for 30.0 years (15.0 ttl pk-yrs)    Types: Cigarettes   Smokeless tobacco: Former    Types: Snuff    Quit date: 30   Tobacco comments:    1/2 PPD  Vaping Use   Vaping status: Never Used  Substance and Sexual Activity   Alcohol use: No    Alcohol/week: 0.0 standard drinks of alcohol   Drug use: No    Comment: last cocaine-2010   Sexual activity: Not Currently    Comment: Hysterectomy  Other Topics Concern   Not on file  Social History Narrative   Not on file   Social Drivers of Health   Financial Resource Strain: Not on file  Food Insecurity: No Food Insecurity (07/14/2023)   Hunger Vital Sign    Worried About Running Out of Food in the Last Year: Never true    Ran Out of Food in the Last Year: Never true  Transportation Needs: No Transportation Needs  (07/14/2023)   PRAPARE - Administrator, Civil Service (Medical): No    Lack of Transportation (Non-Medical): No  Physical Activity: Not on file  Stress: Not on file  Social Connections: Not on file   Family History  Problem Relation Age of Onset   Cancer Mother        Ovarian   Ovarian cancer Mother    Diabetes Mother    Kidney disease Mother    Bleeding Disorder Mother    Diabetes type II Sister    Bleeding Disorder Sister    Diabetes Sister    Colon cancer Maternal Grandfather    Crohn's disease Maternal Grandfather    Stomach cancer Maternal Grandmother    Kidney disease Son    Bleeding Disorder Son    Dysmenorrhea Neg Hx    Allergies  Allergen Reactions   Feraheme [Ferumoxytol] Other (See Comments)    Muscle tetany, encephalopathy, fatigue, nausea (reaction to injection)   Nsaids Other (See Comments)    Stomach bleeding episodes; Tylenol is OK   Wasp Venom Anaphylaxis   Tomato Hives   Iron (Ferrous Sulfate) [Ferrous Sulfate Er] Other (See Comments)    Shock   Prior to Admission medications   Medication Sig Start Date End Date Taking? Authorizing Provider  acetaminophen (TYLENOL) 325 MG tablet Take 2 tablets (650 mg total) by mouth every 6 (six) hours as needed. 05/19/23  Yes Raulkar,  Drema Pry, MD  aminocaproic acid (AMICAR) 500 MG tablet Take 2 tablets (1,000 mg total) by mouth every 12 (twelve) hours. 02/21/23  Yes Boscia, Heather E, NP  amLODipine (NORVASC) 10 MG tablet Take 1 tablet (10 mg total) by mouth daily. 06/10/23 06/09/24 Yes Annett Fabian, MD  citalopram (CELEXA) 20 MG tablet Take 1 tablet (20 mg total) by mouth daily. 06/20/22 07/12/24 Yes Nooruddin, Jason Fila, MD  lisinopril (ZESTRIL) 20 MG tablet Take 1 tablet (20 mg total) by mouth daily. 06/23/23  Yes Nooruddin, Jason Fila, MD  pantoprazole (PROTONIX) 40 MG tablet TAKE 1 TABLET(40 MG) BY MOUTH TWICE DAILY Patient taking differently: Take 40 mg by mouth daily. 09/30/22  Yes Malachy Mood, MD  traZODone (DESYREL)  50 MG tablet Take 1 tablet (50 mg total) by mouth at bedtime as needed for sleep (if). 07/01/23  Yes Nooruddin, Jason Fila, MD  Accu-Chek Softclix Lancets lancets Use to check blood sugar before breakfast and before dinner while on steroids Patient taking differently: 1 each by Other route See admin instructions. Use to check blood sugar before breakfast and before dinner while on steroids 09/27/19   Claudean Severance, MD  ALPRAZolam Prudy Feeler) 0.5 MG tablet TAKE 1 TABLET BY MOUTH AT BEDTIME AS NEEDED FOR ANXIETY OR SLEEP Patient not taking: Reported on 07/13/2023 12/07/22   Malachy Mood, MD  diclofenac Sodium (VOLTAREN ARTHRITIS PAIN) 1 % GEL Apply 4 g topically 4 (four) times daily. Patient not taking: Reported on 07/13/2023 07/01/23   Nooruddin, Jason Fila, MD  gabapentin (NEURONTIN) 100 MG capsule Take 1 capsule (100 mg total) by mouth at bedtime. Patient not taking: Reported on 07/13/2023 07/04/23   Horton Chin, MD  glucose blood (ACCU-CHEK GUIDE) test strip Check blood sugar 2 times per day while on steroids before breakfast and dinner Patient taking differently: 1 each by Other route See admin instructions. Check blood sugar 2 times per day while on steroids before breakfast and dinner 09/27/19   Claudean Severance, MD  PROAIR HFA 108 [phone] Base) MCG/ACT inhaler INHALE 1 TO 2 PUFFS INTO THE LUNGS EVERY 6 HOURS AS NEEDED FOR WHEEZING OR SHORTNESS OF BREATH Patient taking differently: Inhale 1-2 puffs into the lungs every 6 (six) hours as needed for wheezing or shortness of breath. 11/10/20   Malachy Mood, MD  senna-docusate (SENOKOT-S) 8.6-50 MG tablet Take 1 tablet by mouth at bedtime. Patient not taking: Reported on 07/13/2023 05/19/23   Horton Chin, MD  traMADol (ULTRAM) 50 MG tablet Take 1 tablet (50 mg total) by mouth every 12 (twelve) hours as needed. Patient not taking: Reported on 07/13/2023 07/08/23 07/07/24  Horton Chin, MD    Family History Reviewed and non-contributory, no pertinent history of  problems with bleeding or anesthesia      Review of Systems 14 system ROS conducted and negative except for that noted in HPI   OBJECTIVE  Vitals:Patient Vitals for the past 8 hrs:  BP Temp Temp src Pulse Resp SpO2  07/14/23 1200 135/75 98.9 F (37.2 C) Oral 85 19 100 %  07/14/23 0830 135/66 100.3 F (37.9 C) Oral 74 16 100 %  07/14/23 0810 126/62 99.5 F (37.5 C) Oral 73 16 100 %  07/14/23 0739 (!) 116/55 98.2 F (36.8 C) Oral 73 18 99 %  07/14/23 0622 -- (!) 100.4 F (38 C) Oral -- -- --   General: Alert, no acute distress but uncomfortable Cardiovascular: Warm extremities noted Respiratory: No cyanosis, no use of accessory musculature GI: No organomegaly,  abdomen is soft and non-tender Skin: No lesions in the area of chief complaint other than those listed below in MSK exam.  Neurologic: Sensation intact distally save for the below mentioned MSK exam Psychiatric: Patient is competent for consent with normal mood and affect Lymphatic: No swelling obvious and reported other than the area involved in the exam below Extremities  RLE Lateral incision with dehiscence of distal 4cm of incision with purulent foul smelling drainage and surrounding cellulitic changes No visible hardware at incision site Medial incision with small area of dehiscence approximately 0.5cm.  No drainage or fluctuation appreciated with some surrounding cellulitic changes Tender to palpation around both incisions Able to actively dorsiflex and plantarflex ankle and toes with some limited dorsiflexion to 5 beyond neutral Sensation intact SP/DP/T Palpable DP and PT pulses  Test Results DG Ankle Complete Right Result Date: 07/13/2023 CLINICAL DATA:  Right ankle wound dehiscence and drainage. Recent surgery in early January. Concern for infection. EXAM: RIGHT ANKLE - COMPLETE 3+ VIEW COMPARISON:  Right ankle x-rays dated April 24, 2023. FINDINGS: Postsurgical changes related to bimalleolar ORIF with  syndesmotic fixation. No evidence of hardware failure or loosening. Mild tibiotalar and midfoot degenerative changes. Diffuse soft tissue swelling. Superficial subcutaneous emphysema along the lateral hindfoot. IMPRESSION: 1. Diffuse soft tissue swelling with superficial subcutaneous emphysema along the lateral hindfoot, presumably related to wound dehiscence. 2. Postsurgical changes without hardware complication. No radiographic evidence of osteomyelitis. Electronically Signed   By: Obie Dredge M.D.   On: 07/13/2023 17:04   Labs cbc Recent Labs    07/13/23 1735 07/14/23 0219  WBC 18.5* 16.5*  HGB 6.1* 6.7*  HCT 21.2* 22.7*  PLT 333 307    Labs inflam Recent Labs    07/13/23 1735  CRP 30.0*    Labs coag No results for input(s): "INR", "PTT" in the last 72 hours.  Invalid input(s): "PT"  Recent Labs    07/13/23 1735 07/14/23 0219  NA 140 140  K 3.1* 3.2*  CL 107 108  CO2 22 22  GLUCOSE 96 112*  BUN 8 8  CREATININE 0.78 0.75  CALCIUM 9.0 8.4*     ASSESSMENT AND PLAN: 63 y.o. female with the following: S/p Right ankle ORIF on 04/24/23 with dehiscence of her incisions. Patient has not followed up since her discharge from acute rehab.  This patient requires inpatient admission to manage this problem appropriately. Orthopedics recommends admission to a medical service.  Plan for right ankle irrigation and debridement with possible hardware removal and wound VAC placement. Tentative plan for tomorrow afternoon.  NPO at midnight.  - Weight Bearing Status/Activity: NWB RLE  - Additional recommended labs/tests: CT right ankle with contrast to assess bone healing  -Please transfuse for hemoglobin>7 prior to surgery  -Continue broad-spectrum IV antibiotics.  Cultures will be taken at time of surgery  -PRN pain medication

## 2023-07-14 NOTE — TOC Initial Note (Signed)
 Transition of Care [facility]) - Initial/Assessment Note    Patient Details  Name: Peggy House MRN: [id number] Date of Birth: [birth year]  [facility]) CM/SW Contact:    Ronny Bacon, RN Phone Number: 07/14/2023, 1:45 PM  Clinical Narrative:     Patient from home with son. Patient currently receiving IV abx and blood transfusion. Patient has used [facility] for [facility] in the past. Patient wishes to use a different agency if [facility] is needed.   Son or neighbor is available to transport patient home at discharge.             Expected Discharge Plan: Home/Self Care Barriers to Discharge: Continued Medical Work up   Patient Goals and CMS Choice            Expected Discharge Plan and Services       Living arrangements for the past 2 months: Apartment                                      Prior Living Arrangements/Services Living arrangements for the past 2 months: Apartment Lives with:: Adult Children (Son) Patient language and need for interpreter reviewed:: Yes Do you feel safe going back to the place where you live?: Yes      Need for Family Participation in Patient Care: Yes (Comment) Care giver support system in place?: Yes (comment) Current home services: DME [facility]) Criminal Activity/Legal Involvement Pertinent to Current Situation/Hospitalization: No - Comment as needed  Activities of Daily Living   ADL Screening (condition at time of admission) Independently performs ADLs?: No Is the patient deaf or have difficulty hearing?: No Does the patient have difficulty seeing, even when wearing glasses/contacts?: No Does the patient have difficulty concentrating, remembering, or making decisions?: No  Permission Sought/Granted                  Emotional Assessment Appearance:: Appears stated age Attitude/Demeanor/Rapport: Engaged Affect (typically observed): Appropriate Orientation: : Oriented to Self, Oriented to Place, Oriented to   Time, Oriented to Situation Alcohol / Substance Use: Not Applicable Psych Involvement: No (comment)  Admission diagnosis:  Wound dehiscence [T81.30XA] Acute anemia [D64.9] Patient Active Problem List   Diagnosis Date Noted   Wound dehiscence 07/13/2023   Ankle fracture 05/02/2023   Thrombocytopenia (HCC) 04/15/2023   Closed right trimalleolar fracture 04/14/2023   ABLA (acute blood loss anemia) 01/31/2023   Cholelithiasis 11/18/2022   Aortic atherosclerosis (HCC) 11/18/2022   Acute anemia 09/26/2022   Tobacco use disorder 06/22/2022   Depression with anxiety 04/12/2022   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/2021   Syncope 11/01/2020   Trapezius muscle spasm 07/22/2020   Acute GI bleeding 03/01/2020   Leg pain, bilateral 09/28/2019   Urge incontinence 09/28/2019   Pain due to onychomycosis of toenails of both feet 02/26/2019   Diabetes mellitus (HCC) 02/26/2019   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   Anemia due to GI blood loss 12/07/2018   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   Acute deep vein thrombosis (DVT) of right upper extremity (HCC) 12/07/2018   Hypokalemia    Port-A-Cath in place 09/18/2018   GAD (generalized anxiety disorder) 01/20/2018   GI bleeding 10/25/2017   Pulmonary artery hypertension (HCC) 10/19/2017   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   Venous stasis dermatitis of both lower extremities 05/07/2017   Recurrent epistaxis 11/29/2016   Iron deficiency anemia 10/29/2016  Cough 05/13/2016   Osteoarthritis, multiple sites 06/05/2015   Insomnia 06/05/2015   Major depressive disorder, recurrent episode (HCC) 06/05/2015   Obesity, Class III, BMI 40-49.9 (morbid obesity) (HCC) 04/26/2014   GI bleed 04/25/2014   HLD (hyperlipidemia) 04/25/2014   Hypertension    Anxiety    Essential hypertension    Acute on chronic blood loss anemia 04/07/2014   PCP:  Olegario Messier, MD Pharmacy:   [facility]  [phone] - [place], [place] - [address] AT [facility] [address] [facility] [address] [place] [address] Phone: [phone] Fax: [phone]  [place] [facility] [address] [place] [address] Phone: [phone] Fax: [phone]  [facility] [address] [place] [address] Phone: [phone] Fax: [phone]     Social Drivers of Health (SDOH) Social History: SDOH Screenings   Food Insecurity: No Food Insecurity (07/14/2023)  Housing: Low Risk  (07/14/2023)  Transportation Needs: No Transportation Needs (07/14/2023)  Utilities: Not At Risk (07/14/2023)  Depression (PHQ2-9): High Risk (07/04/2023)  Tobacco Use: High Risk (07/13/2023)   SDOH Interventions:     Readmission Risk Interventions    04/18/2023    3:07 PM 04/15/2023    2:22 PM  Readmission Risk Prevention Plan  Transportation Screening Complete Complete  PCP or Specialist Appt within 5-7 Days  Complete  PCP or Specialist Appt within 3-5 Days Complete   Home Care Screening  Complete  Medication Review (RN CM)  Complete  HRI or Home Care Consult Complete   Social Work Consult for Recovery Care Planning/Counseling Complete   Palliative Care Screening Complete   Medication Review Oceanographer) Complete

## 2023-07-14 NOTE — Progress Notes (Addendum)
                 Interval history Admitted  Remain on vanco/ceftriaxone this morning Ortho consulted ,awaiting recs  Patient was seen at bedside, had no complains at this time,says  she feels better this morning.Her right ankle pain is improved . Denies any chest pain, shortness of breath   Physical exam Blood pressure 127/66, pulse 71, temperature (!) 100.4 F (38 C), temperature source Oral, resp. rate (!) 21, height 5\' 4"  (1.626 m), weight 105.2 kg, SpO2 97%.  Laying in [employer] sounds,RRR Lungs are clear bilaterally,on RA Swelling noted at the right ankle ,with purulent discharge at her incision site. Non distended abdomen,no guarding or rebound tenderness    Weight change:    Intake/Output Summary (Last 24 hours) at 07/14/2023 0551 Last data filed at 07/13/2023 2254 Gross per 24 hour  Intake 216 ml  Output --  Net 216 ml   Net IO Since Admission: 216 mL [07/14/23 0551]  Labs, images, and other studies WBC 16.5 < 18.5 Hemoglobin 6.1 > 6.7  Mg 2   Assessment and plan Hospital day 1  Peggy House is a 63 y.o. woman, history of hypertension, HHT, chronic anemia type 2 diabetes who presented with right ankle pain and admitted for wound dehiscence and anemia.  Principal Problem:   Wound dehiscence  Resolved Problems:   * No resolved hospital problems. *   # Right ankle wound dehiscence with purulent discharge and surrounding cellulitis #Hx of prior malleolar ORIF #Leukocytosis - Wound still has purulent discharge this morning - Pain improved - Osteomyelitis ruled out on plain film - WBC 16.5 <18.5 - Continuing Vancomycin and Ceftriaxone - Trend WBC - F/U  on blood cx  - F/U on Ortho recs    # Acute on chronic anemia #Epistaxis #HHT -Getting second bag of RBCs -Follow-up on posttransfusion H&H -Continue home aminocaproic acid 1000 mg twice daily -Epistaxis resolved, monitor for signs of bleeding  # Hypokalemia At 3.2 now.  Repeat BMP and replete as needed  # Hypertension BP 135/66 . Holding antihypertensives at this time.  Will reassess and resume after Ortho see patient.  #T2DM - Well controlled  #GERD - Home PPI #Depression - Celexa 20 mg #Insomnia -  Home trazodone 50 mg    VTE prophylaxis: SCDs Start: 07/13/23 2028 Diet: Regular Diet IVF: None   Code: FULL  PT/OT recommendations: Will reassess after ortho  TOC recommendations: None  Family Update: To be updated   Discharge plan: I anticipate discharge 1-2 days    [name]  MD 07/14/2023, 5:51 AM  Pager: [phone] After 5pm or weekend: [phone]

## 2023-07-15 ENCOUNTER — Inpatient Hospital Stay (HOSPITAL_COMMUNITY): Admitting: Anesthesiology

## 2023-07-15 ENCOUNTER — Inpatient Hospital Stay (HOSPITAL_COMMUNITY)

## 2023-07-15 ENCOUNTER — Encounter (HOSPITAL_COMMUNITY): Payer: Self-pay | Admitting: Infectious Diseases

## 2023-07-15 ENCOUNTER — Encounter (HOSPITAL_COMMUNITY)
Admission: EM | Disposition: A | Discharge status: Home Health | Payer: Self-pay | Source: Home / Self Care | Attending: Internal Medicine

## 2023-07-15 DIAGNOSIS — F418 Other specified anxiety disorders: Secondary | ICD-10-CM | POA: Diagnosis not present

## 2023-07-15 DIAGNOSIS — F17210 Nicotine dependence, cigarettes, uncomplicated: Secondary | ICD-10-CM | POA: Diagnosis not present

## 2023-07-15 DIAGNOSIS — I10 Essential (primary) hypertension: Secondary | ICD-10-CM | POA: Diagnosis not present

## 2023-07-15 DIAGNOSIS — E119 Type 2 diabetes mellitus without complications: Secondary | ICD-10-CM | POA: Diagnosis not present

## 2023-07-15 DIAGNOSIS — T8469XA Infection and inflammatory reaction due to internal fixation device of other site, initial encounter: Secondary | ICD-10-CM

## 2023-07-15 DIAGNOSIS — T84622A Infection and inflammatory reaction due to internal fixation device of right tibia, initial encounter: Secondary | ICD-10-CM | POA: Diagnosis not present

## 2023-07-15 DIAGNOSIS — T84624A Infection and inflammatory reaction due to internal fixation device of right fibula, initial encounter: Secondary | ICD-10-CM | POA: Diagnosis not present

## 2023-07-15 DIAGNOSIS — G8918 Other acute postprocedural pain: Secondary | ICD-10-CM | POA: Diagnosis not present

## 2023-07-15 HISTORY — PX: IRRIGATION AND DEBRIDEMENT POSTERIOR HIP: SHX7265

## 2023-07-15 HISTORY — PX: HARDWARE REMOVAL: SHX979

## 2023-07-15 LAB — GLUCOSE, CAPILLARY: Glucose-Capillary: 108 mg/dL — ABNORMAL HIGH (ref 70–99)

## 2023-07-15 LAB — CBC
HCT: 23.7 % — ABNORMAL LOW (ref 36.0–46.0)
Hemoglobin: 7.1 g/dL — ABNORMAL LOW (ref 12.0–15.0)
MCH: 26.4 pg (ref 26.0–34.0)
MCHC: 30 g/dL (ref 30.0–36.0)
MCV: 88.1 fL (ref 80.0–100.0)
Platelets: 251 10*3/uL (ref 150–400)
RBC: 2.69 MIL/uL — ABNORMAL LOW (ref 3.87–5.11)
RDW: 21.2 % — ABNORMAL HIGH (ref 11.5–15.5)
WBC: 13.4 10*3/uL — ABNORMAL HIGH (ref 4.0–10.5)
nRBC: 0 % (ref 0.0–0.2)

## 2023-07-15 LAB — BASIC METABOLIC PANEL WITH GFR
Anion gap: 9 (ref 5–15)
BUN: 10 mg/dL (ref 8–23)
CO2: 23 mmol/L (ref 22–32)
Calcium: 8.4 mg/dL — ABNORMAL LOW (ref 8.9–10.3)
Chloride: 109 mmol/L (ref 98–111)
Creatinine, Ser: 0.81 mg/dL (ref 0.44–1.00)
GFR, Estimated: >60 mL/min (ref >60)
Glucose, Bld: 89 mg/dL (ref 70–99)
Potassium: 3.7 mmol/L (ref 3.5–5.1)
Sodium: 141 mmol/L (ref 135–145)

## 2023-07-15 LAB — SEDIMENTATION RATE: Sed Rate: 116 mm/h — ABNORMAL HIGH (ref 0–22)

## 2023-07-15 LAB — MRSA NEXT GEN BY PCR, NASAL: MRSA by PCR Next Gen: NOT DETECTED

## 2023-07-15 LAB — C-REACTIVE PROTEIN: CRP: 24.2 mg/dL — ABNORMAL HIGH (ref <1.0)

## 2023-07-15 SURGERY — IRRIGATION AND DEBRIDEMENT, OPEN FRACTURE
Anesthesia: General | Laterality: Right

## 2023-07-15 MED ORDER — DROPERIDOL 2.5 MG/ML IJ SOLN: 0.6250 mg | Freq: Once | INTRAMUSCULAR | Status: DC | PRN

## 2023-07-15 MED ORDER — FENTANYL CITRATE (PF) 100 MCG/2ML IJ SOLN
INTRAMUSCULAR | Status: AC
  Filled 2023-07-15: qty 2

## 2023-07-15 MED ORDER — ACETAMINOPHEN 500 MG PO TABS: 1000.0000 mg | ORAL_TABLET | Freq: Once | ORAL | Status: DC

## 2023-07-15 MED ORDER — DEXAMETHASONE SODIUM PHOSPHATE 10 MG/ML IJ SOLN
INTRAMUSCULAR | Status: DC | PRN
  Administered 2023-07-15: 4 mg via INTRAVENOUS

## 2023-07-15 MED ORDER — MIDAZOLAM HCL 2 MG/2ML IJ SOLN
INTRAMUSCULAR | Status: AC
  Filled 2023-07-15: qty 2

## 2023-07-15 MED ORDER — OXYCODONE HCL 5 MG/5ML PO SOLN: 5.0000 mg | Freq: Once | ORAL | Status: DC | PRN

## 2023-07-15 MED ORDER — ORAL CARE MOUTH RINSE: 15.0000 mL | Freq: Once | OROMUCOSAL | Status: AC

## 2023-07-15 MED ORDER — OXYCODONE HCL 5 MG PO TABS: 5.0000 mg | ORAL_TABLET | Freq: Once | ORAL | Status: DC | PRN

## 2023-07-15 MED ORDER — FENTANYL CITRATE (PF) 250 MCG/5ML IJ SOLN
INTRAMUSCULAR | Status: DC | PRN
  Administered 2023-07-15: 25 ug via INTRAVENOUS
  Administered 2023-07-15 (×2): 50 ug via INTRAVENOUS
  Administered 2023-07-15: 25 ug via INTRAVENOUS

## 2023-07-15 MED ORDER — LACTATED RINGERS IV SOLN: INTRAVENOUS | Status: DC

## 2023-07-15 MED ORDER — PROPOFOL 10 MG/ML IV BOLUS
INTRAVENOUS | Status: DC | PRN
  Administered 2023-07-15: 170 mg via INTRAVENOUS

## 2023-07-15 MED ORDER — CHLORHEXIDINE GLUCONATE 0.12 % MT SOLN
OROMUCOSAL | Status: AC
  Administered 2023-07-15: 15 mL via OROMUCOSAL
  Filled 2023-07-15: qty 15

## 2023-07-15 MED ORDER — ONDANSETRON HCL 4 MG/2ML IJ SOLN
INTRAMUSCULAR | Status: DC | PRN
  Administered 2023-07-15: 4 mg via INTRAVENOUS

## 2023-07-15 MED ORDER — CLONIDINE HCL (ANALGESIA) 100 MCG/ML EP SOLN
EPIDURAL | Status: DC | PRN
  Administered 2023-07-15: 67 ug
  Administered 2023-07-15: 33 ug

## 2023-07-15 MED ORDER — BUPIVACAINE-EPINEPHRINE (PF) 0.5% -1:200000 IJ SOLN
INTRAMUSCULAR | Status: DC | PRN
  Administered 2023-07-15: 10 mL via PERINEURAL
  Administered 2023-07-15: 20 mL via PERINEURAL

## 2023-07-15 MED ORDER — OXYCODONE HCL 5 MG PO TABS
10.0000 mg | ORAL_TABLET | ORAL | Status: DC | PRN
  Administered 2023-07-16 – 2023-07-17 (×8): 10 mg via ORAL
  Filled 2023-07-15 (×9): qty 2

## 2023-07-15 MED ORDER — MIDAZOLAM HCL 2 MG/2ML IJ SOLN
INTRAMUSCULAR | Status: DC | PRN
  Administered 2023-07-15: 1 mg via INTRAVENOUS

## 2023-07-15 MED ORDER — CHLORHEXIDINE GLUCONATE 0.12 % MT SOLN: 15.0000 mL | Freq: Once | OROMUCOSAL | Status: AC

## 2023-07-15 MED ORDER — LIDOCAINE 2% (20 MG/ML) 5 ML SYRINGE
INTRAMUSCULAR | Status: DC | PRN
  Administered 2023-07-15: 100 mg via INTRAVENOUS

## 2023-07-15 MED ORDER — EPHEDRINE SULFATE-NACL 50-0.9 MG/10ML-% IV SOSY
PREFILLED_SYRINGE | INTRAVENOUS | Status: DC | PRN
  Administered 2023-07-15: 5 mg via INTRAVENOUS

## 2023-07-15 MED ORDER — FENTANYL CITRATE (PF) 250 MCG/5ML IJ SOLN
INTRAMUSCULAR | Status: AC
  Filled 2023-07-15: qty 5

## 2023-07-15 MED ORDER — FENTANYL CITRATE (PF) 100 MCG/2ML IJ SOLN
25.0000 ug | INTRAMUSCULAR | Status: DC | PRN
Start: 2023-07-15 — End: 2023-07-15
  Administered 2023-07-15: 50 ug via INTRAVENOUS

## 2023-07-15 SURGICAL SUPPLY — 58 items
ALCOHOL 70% 16 OZ (MISCELLANEOUS) ×2 IMPLANT
BAG COUNTER SPONGE SURGICOUNT (BAG) ×2 IMPLANT
BLADE SURG 10 STRL SS (BLADE) ×2 IMPLANT
BNDG COHESIVE 1X5 TAN STRL LF (GAUZE/BANDAGES/DRESSINGS) IMPLANT
BNDG COHESIVE 4X5 TAN STRL LF (GAUZE/BANDAGES/DRESSINGS) ×2 IMPLANT
BNDG COHESIVE 6X5 TAN ST LF (GAUZE/BANDAGES/DRESSINGS) ×4 IMPLANT
BNDG ELASTIC 3INX 5YD STR LF (GAUZE/BANDAGES/DRESSINGS) IMPLANT
BNDG GAUZE DERMACEA FLUFF 4 (GAUZE/BANDAGES/DRESSINGS) ×6 IMPLANT
BNDG STRETCH GAUZE 3IN X12FT (GAUZE/BANDAGES/DRESSINGS) IMPLANT
CORD BIPOLAR FORCEPS 12FT (ELECTRODE) IMPLANT
COVER SURGICAL LIGHT HANDLE (MISCELLANEOUS) ×2 IMPLANT
CUFF TOURN SGL QUICK 42 (TOURNIQUET CUFF) IMPLANT
CUFF TRNQT CYL 24X4X16.5-23 (TOURNIQUET CUFF) IMPLANT
CUFF TRNQT CYL 34X4.125X (TOURNIQUET CUFF) ×4 IMPLANT
DRAPE BILATERAL LIMB T (DRAPES) IMPLANT
DRAPE IMP U-DRAPE 54X76 (DRAPES) IMPLANT
DRAPE INCISE IOBAN 66X45 STRL (DRAPES) ×8 IMPLANT
DRAPE SURG 17X23 STRL (DRAPES) IMPLANT
DRAPE U-SHAPE 47X51 STRL (DRAPES) ×2 IMPLANT
DURAPREP 26ML APPLICATOR (WOUND CARE) ×2 IMPLANT
ELECT CAUTERY BLADE 6.4 (BLADE) ×2 IMPLANT
ELECT REM PT RETURN 9FT ADLT (ELECTROSURGICAL) IMPLANT
ELECTRODE REM PT RTRN 9FT ADLT (ELECTROSURGICAL) IMPLANT
FACESHIELD WRAPAROUND (MASK) IMPLANT
FACESHIELD WRAPAROUND OR TEAM (MASK) IMPLANT
GAUZE PAD ABD 8X10 STRL (GAUZE/BANDAGES/DRESSINGS) ×2 IMPLANT
GAUZE SPONGE 4X4 12PLY STRL (GAUZE/BANDAGES/DRESSINGS) ×4 IMPLANT
GAUZE XEROFORM 1X8 LF (GAUZE/BANDAGES/DRESSINGS) ×2 IMPLANT
GAUZE XEROFORM 5X9 LF (GAUZE/BANDAGES/DRESSINGS) ×2 IMPLANT
GLOVE BIO SURGEON STRL SZ7.5 (GLOVE) ×4 IMPLANT
GLOVE BIOGEL PI IND STRL 8 (GLOVE) ×4 IMPLANT
GOWN STRL REUS W/ TWL LRG LVL3 (GOWN DISPOSABLE) ×4 IMPLANT
KIT BASIN OR (CUSTOM PROCEDURE TRAY) ×2 IMPLANT
KIT TURNOVER KIT B (KITS) ×2 IMPLANT
MANIFOLD NEPTUNE II (INSTRUMENTS) ×2 IMPLANT
NS IRRIG 1000ML POUR BTL (IV SOLUTION) ×4 IMPLANT
PACK ORTHO EXTREMITY (CUSTOM PROCEDURE TRAY) ×2 IMPLANT
PAD ARMBOARD POSITIONER FOAM (MISCELLANEOUS) ×4 IMPLANT
PADDING CAST ABS COTTON 4X4 ST (CAST SUPPLIES) ×4 IMPLANT
PADDING CAST COTTON 6X4 STRL (CAST SUPPLIES) ×2 IMPLANT
SET CYSTO W/LG BORE CLAMP LF (SET/KITS/TRAYS/PACK) ×2 IMPLANT
SET HNDPC FAN SPRY TIP SCT (DISPOSABLE) IMPLANT
SPONGE T-LAP 18X18 ~~LOC~~+RFID (SPONGE) ×4 IMPLANT
STOCKINETTE IMPERVIOUS 9X36 MD (GAUZE/BANDAGES/DRESSINGS) ×2 IMPLANT
SUT ETHILON 2 0 FS 18 (SUTURE) IMPLANT
SUT ETHILON 2 0 PSLX (SUTURE) IMPLANT
SUT ETHILON 3 0 PS 1 (SUTURE) IMPLANT
SUT VIC AB 2-0 CT1 36 (SUTURE) IMPLANT
SUT VIC AB 2-0 FS1 27 (SUTURE) IMPLANT
SWAB CULTURE ESWAB REG 1ML (MISCELLANEOUS) IMPLANT
SYR CONTROL 10ML LL (SYRINGE) IMPLANT
TOWEL GREEN STERILE (TOWEL DISPOSABLE) ×2 IMPLANT
TOWEL GREEN STERILE FF (TOWEL DISPOSABLE) ×2 IMPLANT
TUBE CONNECTING 12X1/4 (SUCTIONS) ×2 IMPLANT
TUBE NG 5FR 35IN ENFIT (TUBING) IMPLANT
UNDERPAD 30X36 HEAVY ABSORB (UNDERPADS AND DIAPERS) ×4 IMPLANT
WATER STERILE IRR 1000ML POUR (IV SOLUTION) ×2 IMPLANT
YANKAUER SUCT BULB TIP NO VENT (SUCTIONS) ×2 IMPLANT

## 2023-07-15 NOTE — Plan of Care (Signed)
   Problem: Education: Goal: Knowledge of General Education information will improve Description: Including pain rating scale, medication(s)/side effects and non-pharmacologic comfort measures Outcome: Progressing   Problem: Clinical Measurements: Goal: Diagnostic test results will improve Outcome: Progressing Goal: Respiratory complications will improve Outcome: Progressing Goal: Cardiovascular complication will be avoided Outcome: Progressing   Problem: Activity: Goal: Risk for activity intolerance will decrease Outcome: Progressing   Problem: Nutrition: Goal: Adequate nutrition will be maintained Outcome: Progressing   Problem: Coping: Goal: Level of anxiety will decrease Outcome: Progressing   Problem: Elimination: Goal: Will not experience complications related to bowel motility Outcome: Progressing Goal: Will not experience complications related to urinary retention Outcome: Progressing   Problem: Pain Managment: Goal: General experience of comfort will improve and/or be controlled Outcome: Progressing   Problem: Safety: Goal: Ability to remain free from injury will improve Outcome: Progressing   Problem: Skin Integrity: Goal: Risk for impaired skin integrity will decrease Outcome: Progressing

## 2023-07-15 NOTE — Consult Note (Signed)
 WOC team consulted for ankle wound.  This patient is s/p R ORIF with wound dehiscence and has been seen by orthopedics 07/14/2023.   Per ortho note :"plan for right ankle irrigation and debridement with possible hardware removal and wound VAC placement. Tentative plan for tomorrow afternoon. NPO at midnight."  WOC team will sign off on this patient as ortho is managing this wound.  Re-consult if further needs arise.   Thank you,    Priscella Mann MSN, RN-BC, [employer] [phone]

## 2023-07-15 NOTE — Progress Notes (Signed)
 63 year-old female with a right ankle fracture who underwent ORIF on 04/24/23.  She was subsequently lost to follow-up after discharge from acute rehab.  She returned to the emergency room due to wound discharge.  On exam she appears to have dehiscence of her medial and lateral wounds with purulent drainage.  Plan for right ankle irrigation and debridement with possible hardware removal and wound VAC placement  Risks and benefits of surgery were discussed with patient and all questions answered.  These risks include worsening infection, bleeding, damage to neurovascular structures, fracture, prolonged rehabilitation, DVT, need for assistive device.  It is also explained to her that giving her chronic medical conditions and poor wound healing she is a high risk for further complications that may lead to amputation or even death.  She understands these risks and wished to proceed with surgery

## 2023-07-15 NOTE — Anesthesia Procedure Notes (Signed)
 Procedure Name: LMA Insertion Date/Time: 07/15/2023 2:14 PM  Performed by: Randon Goldsmith, CRNAPre-anesthesia Checklist: Patient identified, Emergency Drugs available, Suction available and Patient being monitored Patient Re-evaluated:Patient Re-evaluated prior to induction Oxygen Delivery Method: Circle system utilized Preoxygenation: Pre-oxygenation with 100% oxygen Induction Type: IV induction Ventilation: Mask ventilation without difficulty LMA: LMA inserted LMA Size: 4.0 Airway Equipment and Method: Bite block Placement Confirmation: positive ETCO2 and breath sounds checked- equal and bilateral Tube secured with: Tape Dental Injury: Teeth and Oropharynx as per pre-operative assessment

## 2023-07-15 NOTE — Anesthesia Postprocedure Evaluation (Signed)
 Anesthesia Post Note  Patient: Peggy House  Procedure(s) Performed: IRRIGATION AND DEBRIDEMENT, OPEN FRACTURE (Right) REMOVAL, HARDWARE (Right)     Patient location during evaluation: PACU Anesthesia Type: General Level of consciousness: awake and alert Pain management: pain level controlled Vital Signs Assessment: post-procedure vital signs reviewed and stable Respiratory status: spontaneous breathing, nonlabored ventilation and respiratory function stable Cardiovascular status: blood pressure returned to baseline Postop Assessment: no apparent nausea or vomiting Anesthetic complications: no   No notable events documented.  Last Vitals:  Vitals:   07/15/23 1641 07/15/23 1951  BP: (!) 141/70 128/67  Pulse: 69 70  Resp: 17 17  Temp: 37.2 C 36.7 C  SpO2: 97% 98%              Shanda Howells

## 2023-07-15 NOTE — Op Note (Signed)
 .Orthopaedic Surgery Operative Note (CSN: [id number])  Peggy SARCHET  04/11/1961 Date of Surgery: 07/15/2023   Diagnoses:  Peri-implant infection after ankle open reduction internal fixation (T84.62)  Procedure: Right ankle irrigation and debridement (CPT 11044) Removal of hardware (CPT 20680) Wound vac application (CPT [phone])   Operative Finding Dehiscence of medial and lateral incisions with exposed hardware along lateral fibula. Purulent discharge from both incisions. Cultures were taken from each incision. All hardware removed. Irrigation and debridement with wound vac application. Distal aspect of lateral wound could not be primarily closed with a defect measuring approximately 7 cm x 4  cm.   Post-operative plan: The patient will be NWB RLE.  The patient will be kept on broad spectrum vancomycin and ceftriaxone pending culture results. She may require repeat irrigation and debridement and wound closure if possible. Plastic surgery may need to be consulted to assist in wound coverage.  DVT prophylaxis being held due history of HHT and bleeding.  Pain control with PRN pain medication preferring oral medicines.  Post-Op Diagnosis: Same Surgeons:Primary: Peggy Bank, MD Assistants:Peggy House, RFNA Location: [facility] OR ROOM 01 Anesthesia: General with regional anesthesia Antibiotics:  Patient on standing vancomycin and ceftriaxone Tourniquet time: N/A Estimated Blood Loss: 150 cc Complications: None Specimens:  Culture swabs from lateral wound x4 Culture swabs from medial wound x2 Lateral malleolus plate and screws Medial malleolus screws  Indications for Surgery:   Peggy House is a 63 y.o. female with PMH of HHT, chronic anemia, HTN, T2DM, hx of GI bleed, tobacco use,  Right ankle ORIF on 04/24/23   Patient was in acute rehab until early February after which she was discharged.  She is has failed to follow-up in the office since her discharge.  She states she has  not been able to follow-up due to transportation issues.  She states she has tried to be compliant with nonweightbearing but has put weight on her leg while at home.  She reports for past 1-2 weeks of right ankle swelling and worsening pain and noted the lateral incision site opened with malodorous drainage for past few days. Pain mainly around the wound sites of the right ankle. She has dehiscence of her medial and lateral incisions with malodorous discharge with concern for infected hardware after her ankle open reduction internal fixation. She has elevated inflammatory markers and leukocytosis. Given her infection the patient was counseled that to provide the best chance for wound healing and eradication of infection she would require an irrigation and debridement and removal of hardware.   Risks and benefits of surgery were discussed with patient and all questions answered. These risks include worsening infection, bleeding, damage to neurovascular structures, fracture, prolonged rehabilitation, DVT, need for assistive device. It is also explained to her that giving her chronic medical conditions and poor wound healing she is a high risk for further complications that may lead to amputation or even death. She understands these risks and wished to proceed with surgery   Procedure:   The patient was identified properly. Informed consent was obtained and the surgical site was marked. The patient was taken up to suite where general anesthesia was induced.  The patient was positioned supine.  The right leg was prepped and draped in the usual sterile fashion with betadine.  Timeout was performed before the beginning of the case.  After the betadine scrub the distal aspect of the lateral plate was visible through the wound. The previous incision was incised and blunt dissection was  performed until the plate was exposed. Frank pus was encountered with exposure. The plate and screws were removed without difficulty.  The underlying fracture appeared to be healed.  We then turned our attention to the medial wound. The previous incision over the medial malleolus was sharply incised and blunt dissection carried down to the medial malleolus. Frank pus was encountered during exposure. The fracture about to be healed however the superficial bone was noted to be soft. The 2 screws were removed without difficulty. Fluoroscopic imaging showed no retained hardware.  Both wounds were debrided of any devitalized skin, soft tissue and bone using a scalpel, rongeur and curettes. The wounds were irrigated with 6L normal saline.  The medial wound was closed using 2-0 nylon sutures. There was noted to be fairly significant tension on the wound. The proximal aspect of the lateral wound was closed with 2-0 nylon. The distal aspect could not be primarily closed and a wound approximately 7 cm x 4 cm remained. A wound vac was applied to both the medial and lateral wounds with good seal noted. A sterile dressing was applied. The patient was awoken taken to PACU in stable condition.

## 2023-07-15 NOTE — Anesthesia Procedure Notes (Signed)
 Anesthesia Regional Block: Adductor canal block   Pre-Anesthetic Checklist: , timeout performed,  Correct Patient, Correct Site, Correct Laterality,  Correct Procedure, Correct Position, site marked,  Risks and benefits discussed,  Pre-op evaluation,  At surgeon's request and post-op pain management  Laterality: Right  Prep: Maximum Sterile Barrier Precautions used, chloraprep       Needles:  Injection technique: Single-shot  Needle Type: Echogenic Stimulator Needle     Needle Length: 9cm  Needle Gauge: 22     Additional Needles:   Procedures:,,,, ultrasound used (permanent image in chart),,    Narrative:  Start time: 07/15/2023 1:54 PM End time: 07/15/2023 1:57 PM Injection made incrementally with aspirations every 5 mL.  Performed by: Personally  Anesthesiologist: Kaylyn Layer, MD  Additional Notes: Risks, benefits, and alternative discussed. Patient gave consent for procedure. Patient prepped and draped in sterile fashion. Sedation administered, patient remains easily responsive to voice. Relevant anatomy identified with ultrasound guidance. Local anesthetic given in 5cc increments with no signs or symptoms of intravascular injection. No pain or paraesthesias with injection. Patient monitored throughout procedure with signs of LAST or immediate complications. Tolerated well. Ultrasound image placed in chart.  Peggy Greenhouse, MD

## 2023-07-15 NOTE — Anesthesia Procedure Notes (Signed)
 Anesthesia Regional Block: Popliteal block   Pre-Anesthetic Checklist: , timeout performed,  Correct Patient, Correct Site, Correct Laterality,  Correct Procedure, Correct Position, site marked,  Risks and benefits discussed,  Pre-op evaluation,  At surgeon's request and post-op pain management  Laterality: Right  Prep: Maximum Sterile Barrier Precautions used, chloraprep       Needles:  Injection technique: Single-shot  Needle Type: Echogenic Stimulator Needle     Needle Length: 9cm  Needle Gauge: 22     Additional Needles:   Procedures:,,,, ultrasound used (permanent image in chart),,    Narrative:  Start time: 07/15/2023 1:57 PM End time: 07/15/2023 1:59 PM Injection made incrementally with aspirations every 5 mL.  Performed by: Personally  Anesthesiologist: Kaylyn Layer, MD  Additional Notes: Risks, benefits, and alternative discussed. Patient gave consent for procedure. Patient prepped and draped in sterile fashion. Sedation administered, patient remains easily responsive to voice. Relevant anatomy identified with ultrasound guidance. Local anesthetic given in 5cc increments with no signs or symptoms of intravascular injection. No pain or paraesthesias with injection. Patient monitored throughout procedure with signs of LAST or immediate complications. Tolerated well. Ultrasound image placed in chart.  Amalia Greenhouse, MD

## 2023-07-15 NOTE — Transfer of Care (Signed)
 Immediate Anesthesia Transfer of Care Note  Patient: Peggy House  Procedure(s) Performed: IRRIGATION AND DEBRIDEMENT, OPEN FRACTURE (Right) REMOVAL, HARDWARE (Right)  Patient Location: PACU  Anesthesia Type:GA combined with regional for post-op pain  Level of Consciousness: awake, alert , and oriented  Airway & Oxygen Therapy: Patient Spontanous Breathing  Post-op Assessment: Report given to RN and Post -op Vital signs reviewed and stable  Post vital signs: Reviewed and stable  Last Vitals:  Vitals Value Taken Time  BP 130/74 07/15/23 1539  Temp    Pulse 80 07/15/23 1542  Resp 15 07/15/23 1542  SpO2 94 % 07/15/23 1542  Vitals shown include unfiled device data.  Last Pain:  Vitals:   07/15/23 1319  TempSrc: Oral  PainSc: 8       Patients Stated Pain Goal: 2 (07/15/23 1019)  Complications: No notable events documented.

## 2023-07-15 NOTE — Anesthesia Preprocedure Evaluation (Addendum)
 Anesthesia Evaluation  Patient identified by MRN, date of birth, ID band Patient awake    Reviewed: Allergy & Precautions, NPO status , Patient's Chart, lab work & pertinent test results  History of Anesthesia Complications Negative for: history of anesthetic complications  Airway Mallampati: II  TM Distance: >3 FB Neck ROM: Full    Dental  (+) Missing,    Pulmonary Current Smoker and Patient abstained from smoking.   Pulmonary exam normal        Cardiovascular hypertension, Pt. on medications Normal cardiovascular exam     Neuro/Psych   Anxiety Depression       GI/Hepatic Neg liver ROS,GERD  Medicated,,  Endo/Other  diabetes, Type 2    Renal/GU negative Renal ROS     Musculoskeletal  (+) Arthritis ,  right ankle fracture   Abdominal   Peds  Hematology  (+) Blood dyscrasia (Hgb 7.1), anemia   Anesthesia Other Findings Day of surgery medications reviewed with patient.  Reproductive/Obstetrics                             Anesthesia Physical Anesthesia Plan  ASA: 3  Anesthesia Plan: General   Post-op Pain Management: Tylenol PO (pre-op)* and Regional block*   Induction: Intravenous  PONV Risk Score and Plan: 3 and Treatment may vary due to age or medical condition, Ondansetron, Dexamethasone and Midazolam  Airway Management Planned: LMA  Additional Equipment: None  Intra-op Plan:   Post-operative Plan: Extubation in OR  Informed Consent: I have reviewed the patients History and Physical, chart, labs and discussed the procedure including the risks, benefits and alternatives for the proposed anesthesia with the patient or authorized representative who has indicated his/her understanding and acceptance.     Dental advisory given  Plan Discussed with: CRNA  Anesthesia Plan Comments:        Anesthesia Quick Evaluation

## 2023-07-15 NOTE — Progress Notes (Signed)
                 Interval history Patient was seen at bedside this morning, she was laying in bed comfortable in no acute distress.  Reports she is still in pain around her ankle but otherwise has no active concerns at this time.   Physical exam Blood pressure (!) 155/76, pulse 73, temperature 99.9 F (37.7 C), temperature source Oral, resp. rate 17, height 5\' 4"  (1.626 m), weight 105.2 kg, SpO2 96%.  No acute distress Normal heart sounds regular rate and rhythm Lungs are clear to auscultation, satting well on room air Right ankle incision clean and dry dressing, no drainage  Weight change:    Intake/Output Summary (Last 24 hours) at 07/15/2023 0959 Last data filed at 07/14/2023 2239 Gross per 24 hour  Intake 1028.33 ml  Output 1 ml  Net 1027.33 ml   Net IO Since Admission: 1,343.33 mL [07/15/23 0959]  Labs, images, and other studies WBC 13.4 <16.5 < 18.5 Hemoglobin 7.1 BMP unremarkable   Assessment and plan Hospital day 2  RONNAE KASER is a 63 y.o. woman, history of hypertension, HHT, chronic anemia type 2 diabetes who presented with right ankle pain and admitted for wound dehiscence and anemia.  Principal Problem:   Wound dehiscence Active Problems:   Hypertension   Diabetes mellitus (HCC)   Hereditary hemorrhagic telangiectasia (HCC)   Acute anemia  Resolved Problems:   * No resolved hospital problems. *   # Right ankle wound dehiscence with purulent discharge and surrounding cellulitis #Hx of prior malleolar ORIF #Leukocytosis - WBC improving 13.4 <16.5 < 18.5 - Incision and Drainage with ortho today - Continue Vancomycin and ceftriaxone  - Follow up cultures  - Optimize pain control    # Acute on chronic anemia #Epistaxis #HHT - Hgb 7.1 - No active bleeding - Trend CBC and transfuse with a goal > 7  # Hypokalemia RESOLVED   # Hypertension - Holding BP meds until after surgery today    #GERD - Home PPI #Depression - Celexa 20  mg #Insomnia -  Home trazodone 50 mg    VTE prophylaxis: SCDs Start: 07/13/23 2028 Diet: Regular Diet IVF: None   Code: FULL  PT/OT recommendations: Will reassess after ortho  TOC recommendations: None  Family Update: To be updated   Discharge plan: I anticipate discharge 1-2 days    [name]  MD 07/15/2023, 9:59 AM  Pager: [phone] After 5pm or weekend: [phone]

## 2023-07-16 ENCOUNTER — Encounter (HOSPITAL_COMMUNITY): Payer: Self-pay | Admitting: Orthopedic Surgery

## 2023-07-16 DIAGNOSIS — T8130XA Disruption of wound, unspecified, initial encounter: Secondary | ICD-10-CM

## 2023-07-16 DIAGNOSIS — M86171 Other acute osteomyelitis, right ankle and foot: Secondary | ICD-10-CM | POA: Diagnosis not present

## 2023-07-16 DIAGNOSIS — I780 Hereditary hemorrhagic telangiectasia: Secondary | ICD-10-CM | POA: Diagnosis not present

## 2023-07-16 DIAGNOSIS — M86271 Subacute osteomyelitis, right ankle and foot: Secondary | ICD-10-CM

## 2023-07-16 DIAGNOSIS — I10 Essential (primary) hypertension: Secondary | ICD-10-CM | POA: Diagnosis not present

## 2023-07-16 DIAGNOSIS — S91001A Unspecified open wound, right ankle, initial encounter: Secondary | ICD-10-CM | POA: Diagnosis not present

## 2023-07-16 DIAGNOSIS — E1169 Type 2 diabetes mellitus with other specified complication: Secondary | ICD-10-CM | POA: Diagnosis not present

## 2023-07-16 DIAGNOSIS — D649 Anemia, unspecified: Secondary | ICD-10-CM | POA: Diagnosis not present

## 2023-07-16 LAB — CBC WITH DIFFERENTIAL/PLATELET
Abs Immature Granulocytes: 0.09 10*3/uL — ABNORMAL HIGH (ref 0.00–0.07)
Basophils Absolute: 0 10*3/uL (ref 0.0–0.1)
Basophils Relative: 0 %
Eosinophils Absolute: 0 10*3/uL (ref 0.0–0.5)
Eosinophils Relative: 0 %
HCT: 22.5 % — ABNORMAL LOW (ref 36.0–46.0)
Hemoglobin: 6.7 g/dL — CL (ref 12.0–15.0)
Immature Granulocytes: 1 %
Lymphocytes Relative: 6 %
Lymphs Abs: 0.9 10*3/uL (ref 0.7–4.0)
MCH: 26.5 pg (ref 26.0–34.0)
MCHC: 29.8 g/dL — ABNORMAL LOW (ref 30.0–36.0)
MCV: 88.9 fL (ref 80.0–100.0)
Monocytes Absolute: 0.6 10*3/uL (ref 0.1–1.0)
Monocytes Relative: 4 %
Neutro Abs: 12.9 10*3/uL — ABNORMAL HIGH (ref 1.7–7.7)
Neutrophils Relative %: 89 %
Platelets: 258 10*3/uL (ref 150–400)
RBC: 2.53 MIL/uL — ABNORMAL LOW (ref 3.87–5.11)
RDW: 21 % — ABNORMAL HIGH (ref 11.5–15.5)
WBC: 14.5 10*3/uL — ABNORMAL HIGH (ref 4.0–10.5)
nRBC: 0 % (ref 0.0–0.2)

## 2023-07-16 LAB — HEMOGLOBIN AND HEMATOCRIT, BLOOD
HCT: 25.1 % — ABNORMAL LOW (ref 36.0–46.0)
Hemoglobin: 7.6 g/dL — ABNORMAL LOW (ref 12.0–15.0)

## 2023-07-16 LAB — PREPARE RBC (CROSSMATCH)

## 2023-07-16 MED ORDER — LISINOPRIL 20 MG PO TABS
20.0000 mg | ORAL_TABLET | Freq: Every day | ORAL | Status: DC
  Administered 2023-07-16 – 2023-07-31 (×16): 20 mg via ORAL
  Filled 2023-07-16 (×16): qty 1

## 2023-07-16 MED ORDER — AMLODIPINE BESYLATE 10 MG PO TABS
10.0000 mg | ORAL_TABLET | Freq: Every day | ORAL | Status: DC
  Administered 2023-07-16 – 2023-07-31 (×16): 10 mg via ORAL
  Filled 2023-07-16 (×16): qty 1

## 2023-07-16 NOTE — Progress Notes (Addendum)
                 Interval history Patient had Irrigation and debridement with wound vac application with ortho.All hardware removed.  She tolerated the procedure very well.  Patient was seen at bedside this morning. Reports pain is well controlled. She didn't have any new concerns.  Physical exam Blood pressure (!) 161/76, pulse (!) 57, temperature 98 F (36.7 C), temperature source Oral, resp. rate 18, height 5\' 4"  (1.626 m), weight 105.2 kg, SpO2 97%.  NAD  Surgical site dry and intact Cardiac  exam remains unchanged and unremarkable, regular rate and rhythm Lungs are clear to auscultation, satting well on room air Oriented to time place and person.  Weight change:    Intake/Output Summary (Last 24 hours) at 07/16/2023 1420 Last data filed at 07/16/2023 0820 Gross per 24 hour  Intake 1011 ml  Output 500 ml  Net 511 ml   Net IO Since Admission: 1,854.33 mL [07/16/23 1420]  Labs, images, and other studies  Hemoglobin 6.7 ,pending post HnH ,otherwise stable CBC  No new BMP labs  Wound  gram stain- Gram positive cocci so far Wound cx - [id number]  Assessment and plan Hospital day 3  Peggy House is a 63 y.o. woman, history of hypertension, HHT, chronic anemia type 2 diabetes who presented with right ankle pain and admitted for wound dehiscence and anemia.  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 *  #Right ankle wound dehiscence with purulent discharge and surrounding cellulitis #Hx of prior malleolar ORIF #Leukocytosis - NWB RLE - Narrowing Abx to Vancomycin. Susceptibility to follow  - Holding DVT ppx due to HHT and bleeding  - Pain control - May need  another I&d and wound closure  - General surgery assistance greatly appreciated   #Acute on chronic anemia #Epistaxis #HHT - Hgb 6.7, got one unit of blood,F/U on HnH - Trend CBC and transfuse with a goal > 7 - Hold DVT prophylaxis   #Hypertension - BP of 161/76 - Will resume Lisinopril 20 mg and amlodipine 10 mg   #GERD - Home PPI #Depression - Celexa 20 mg #Insomnia -  Home trazodone 50 mg   VTE prophylaxis: SCDs Start: 07/13/23 2028 Diet: Regular Diet IVF: None   Code: FULL  PT/OT recommendations: Will reassess after ortho  TOC recommendations: None  Family Update: To be updated   Discharge plan: I anticipate discharge 1-2 days    Peggy Lime  MD 07/16/2023, 2:20 PM  Pager: [phone] After 5pm or weekend: [phone]

## 2023-07-16 NOTE — Plan of Care (Signed)
  Problem: Education: Goal: Knowledge of General Education information will improve Description: Including pain rating scale, medication(s)/side effects and non-pharmacologic comfort measures Outcome: Progressing   Problem: Clinical Measurements: Goal: Ability to maintain clinical measurements within normal limits will improve Outcome: Progressing Goal: Will remain free from infection Outcome: Progressing Goal: Diagnostic test results will improve Outcome: Progressing Goal: Respiratory complications will improve Outcome: Progressing   Problem: Activity: Goal: Risk for activity intolerance will decrease Outcome: Progressing   Problem: Nutrition: Goal: Adequate nutrition will be maintained Outcome: Progressing   Problem: Coping: Goal: Level of anxiety will decrease Outcome: Progressing   Problem: Elimination: Goal: Will not experience complications related to bowel motility Outcome: Progressing   Problem: Pain Managment: Goal: General experience of comfort will improve and/or be controlled Outcome: Progressing   Problem: Safety: Goal: Ability to remain free from injury will improve Outcome: Progressing   Problem: Skin Integrity: Goal: Risk for impaired skin integrity will decrease Outcome: Progressing

## 2023-07-16 NOTE — Plan of Care (Signed)
  Problem: Pain Managment: Goal: General experience of comfort will improve and/or be controlled Outcome: Progressing   Problem: Safety: Goal: Ability to remain free from injury will improve Outcome: Progressing   Problem: Skin Integrity: Goal: Risk for impaired skin integrity will decrease Outcome: Progressing

## 2023-07-16 NOTE — Progress Notes (Signed)
 Physical Therapy Treatment Patient Details Name: Peggy House MRN: [id number] DOB: 07/05/1960 Today's Date: 07/16/2023   History of Present Illness Peggy House is a 63 y.o. female admitted 3/30 with dehiscence of her incisions from right ankle ORIF in Jan.  Pt has been weight bearing at times on right foot and developed drainage and cellulitis.  I&D right ankle with hardware removal with wound VAC placement 4/1.   PMH of HHT, chronic anemia, HTN, T2DM, hx of GI bleed, tobacco use,  Right ankle ORIF on 04/24/23 with SNF rehab stay through mid Feb.    PT Comments  Pt admitted with above diagnosis. Pt progressing and should progress to Modif I with transfers. Pt was Modif I with wheelchair prior to this admission. Will continue to follow acutely.  Pt currently with functional limitations due to the deficits listed below (see PT Problem List). Pt will benefit from acute skilled PT to increase their independence and safety with mobility to allow discharge.       If plan is discharge home, recommend the following: A little help with walking and/or transfers;A little help with bathing/dressing/bathroom;Assistance with cooking/housework;Assist for transportation;Help with stairs or ramp for entrance   Can travel by private vehicle        Equipment Recommendations  None recommended by PT    Recommendations for Other Services       Precautions / Restrictions Precautions Precautions: Fall Precaution/Restrictions Comments: VAC in place Restrictions Weight Bearing Restrictions Per Provider Order: Yes RLE Weight Bearing Per Provider Order: Non weight bearing     Mobility  Bed Mobility Overal bed mobility: Needs Assistance Bed Mobility: Supine to Sit, Sit to Supine     Supine to sit: Min assist     General bed mobility comments: Pt able to bring LEs to EOB wtih a little assist and then some assist for elevation of trunk.    Transfers Overall transfer level: Needs  assistance Equipment used: None Transfers: Bed to chair/wheelchair/BSC            Lateral/Scoot Transfers: From elevated surface, +2 safety/equipment, Min assist General transfer comment: Pt was able to scoot to drop arm recliner with CGA and min cues.  PT did not have to physically help pt to scoot to chair.  Pt able to maintain NWB right LE as well.    Ambulation/Gait                   Stairs             Wheelchair Mobility     Tilt Bed    Modified Rankin (Stroke Patients Only)       Balance Overall balance assessment: Needs assistance Sitting-balance support: No upper extremity supported, Feet supported Sitting balance-Leahy Scale: Good Sitting balance - Comments: Supervision to sit EOB with good safety awareness.                                    Communication Communication Communication: No apparent difficulties  Cognition Arousal: Alert Behavior During Therapy: WFL for tasks assessed/performed   PT - Cognitive impairments: No apparent impairments                         Following commands: Intact      Cueing Cueing Techniques: Verbal cues, Tactile cues  Exercises General Exercises - Lower Extremity Quad Sets: AROM, Both, 10 reps,  Supine Long Arc Quad: AROM, Both, 5 reps, Seated Heel Slides: Both, AAROM, 10 reps, AROM, Supine    General Comments        Pertinent Vitals/Pain Pain Assessment Pain Assessment: Faces Faces Pain Scale: Hurts whole lot Breathing: normal Negative Vocalization: none Body Language: relaxed Consolability: no need to console Pain Location: right foot Pain Descriptors / Indicators: Aching, Discomfort, Grimacing, Guarding Pain Intervention(s): Limited activity within patient's tolerance, Monitored during session, Repositioned    Home Living                          Prior Function            PT Goals (current goals can now be found in the care plan section) Acute  Rehab PT Goals Patient Stated Goal: to go home Progress towards PT goals: Progressing toward goals    Frequency    Min 2X/week      PT Plan      Co-evaluation              AM-PAC PT "6 Clicks" Mobility   Outcome Measure  Help needed turning from your back to your side while in a flat bed without using bedrails?: A Little Help needed moving from lying on your back to sitting on the side of a flat bed without using bedrails?: A Little Help needed moving to and from a bed to a chair (including a wheelchair)?: A Little Help needed standing up from a chair using your arms (e.g., wheelchair or bedside chair)?: Total Help needed to walk in hospital room?: Total Help needed climbing 3-5 steps with a railing? : Total 6 Click Score: 12    End of Session Equipment Utilized During Treatment: Gait belt Activity Tolerance: Patient limited by fatigue;Patient limited by pain Patient left: with call bell/phone within reach;in chair;with chair alarm set Nurse Communication: Mobility status PT Visit Diagnosis: Other abnormalities of gait and mobility (R26.89);Muscle weakness (generalized) (M62.81);Pain Pain - Right/Left: Right Pain - part of body: Ankle and joints of foot     Time: 1134-1200 PT Time Calculation (min) (ACUTE ONLY): 26 min  Charges:    $Therapeutic Exercise: 8-22 mins $Therapeutic Activity: 8-22 mins PT General Charges $$ ACUTE PT VISIT: 1 Visit                     Zyniah Ferraiolo M,PT Acute Rehab Services [phone]    [name] 07/16/2023, 1:42 PM

## 2023-07-16 NOTE — Progress Notes (Signed)
 Pharmacy Antibiotic Note  Peggy House is a 63 y.o. female for which pharmacy has been consulted for vancomycin dosing for  wound infection . Patient with a history of HHT, chronic anemia, HTN, T2DM, hx of GI bleed. Recent repair right bimalleolar ORIF in 04/24/23 in setting of fracture after syncopal episode.  Now s/p I&D 4/1 with culture growing GPCs. Renal fx stable.    Plan: Ceftriaxone 2g IV q24hr Vancomycin 1500mg  Q24hr (AUC 503, Scr 0.78)  Monitor cultures, clinical status, renal function, vancomycin level if continuing Narrow abx as able and f/u duration    Height: 5\' 4"  (162.6 cm) Weight: 105.2 kg (231 lb 14.8 oz) IBW/kg (Calculated) : 54.7  Temp (24hrs), Avg:98.8 F (37.1 C), Min:98.1 F (36.7 C), Max:99.9 F (37.7 C)  Recent Labs  Lab 07/13/23 1735 07/14/23 0219 07/15/23 0405 07/16/23 0343  WBC 18.5* 16.5* 13.4* 14.5*  CREATININE 0.78 0.75 0.81  --     Estimated Creatinine Clearance: 85.1 mL/min (by C-G formula based on SCr of 0.81 mg/dL).    Allergies  Allergen Reactions   Feraheme [Ferumoxytol] Other (See Comments)    Muscle tetany, encephalopathy, fatigue, nausea (reaction to injection)   Nsaids Other (See Comments)    Stomach bleeding episodes; Tylenol is OK   Wasp Venom Anaphylaxis   Tomato Hives   Iron (Ferrous Sulfate) [Ferrous Sulfate Er] Other (See Comments)    Shock   Antibiotics 3/30 CTX >> 3/30 Vanc >>  Microbiology 4/1 OR cxs 2/2 rare GPC 4/1 MRSA neg  3/30 Bcx ngtd   Thank you for allowing pharmacy to be a part of this patient's care.  Alphia Moh, PharmD, BCPS, BCCP Clinical Pharmacist  Please check AMION for all [facility] Pharmacy phone numbers After 10:00 PM, call Main Pharmacy [phone]

## 2023-07-17 ENCOUNTER — Inpatient Hospital Stay (HOSPITAL_COMMUNITY)

## 2023-07-17 DIAGNOSIS — M86271 Subacute osteomyelitis, right ankle and foot: Secondary | ICD-10-CM | POA: Diagnosis not present

## 2023-07-17 DIAGNOSIS — M86171 Other acute osteomyelitis, right ankle and foot: Secondary | ICD-10-CM

## 2023-07-17 DIAGNOSIS — D649 Anemia, unspecified: Secondary | ICD-10-CM | POA: Diagnosis not present

## 2023-07-17 DIAGNOSIS — S82891A Other fracture of right lower leg, initial encounter for closed fracture: Secondary | ICD-10-CM | POA: Diagnosis not present

## 2023-07-17 DIAGNOSIS — I780 Hereditary hemorrhagic telangiectasia: Secondary | ICD-10-CM | POA: Diagnosis not present

## 2023-07-17 DIAGNOSIS — M869 Osteomyelitis, unspecified: Secondary | ICD-10-CM

## 2023-07-17 DIAGNOSIS — I10 Essential (primary) hypertension: Secondary | ICD-10-CM | POA: Diagnosis not present

## 2023-07-17 DIAGNOSIS — S91001A Unspecified open wound, right ankle, initial encounter: Secondary | ICD-10-CM | POA: Diagnosis not present

## 2023-07-17 DIAGNOSIS — E1169 Type 2 diabetes mellitus with other specified complication: Secondary | ICD-10-CM | POA: Diagnosis not present

## 2023-07-17 LAB — BPAM RBC
Blood Product Expiration Date: 202504202359
Blood Product Expiration Date: 202504202359
Blood Product Expiration Date: 202504192359
ISSUE DATE / TIME: 202503310758
ISSUE DATE / TIME: 202504020514
ISSUE DATE / TIME: 202503302047
Unit Type and Rh: 600
Unit Type and Rh: 600
Unit Type and Rh: 600

## 2023-07-17 LAB — TYPE AND SCREEN
ABO/RH(D): A NEG
Antibody Screen: NEGATIVE
Unit division: 0
Unit division: 0
Unit division: 0

## 2023-07-17 LAB — BASIC METABOLIC PANEL WITH GFR
Anion gap: 11 (ref 5–15)
BUN: 12 mg/dL (ref 8–23)
CO2: 25 mmol/L (ref 22–32)
Calcium: 8.1 mg/dL — ABNORMAL LOW (ref 8.9–10.3)
Chloride: 105 mmol/L (ref 98–111)
Creatinine, Ser: 0.79 mg/dL (ref 0.44–1.00)
GFR, Estimated: >60 mL/min (ref >60)
Glucose, Bld: 107 mg/dL — ABNORMAL HIGH (ref 70–99)
Potassium: 3.6 mmol/L (ref 3.5–5.1)
Sodium: 141 mmol/L (ref 135–145)

## 2023-07-17 LAB — CBC
HCT: 24.1 % — ABNORMAL LOW (ref 36.0–46.0)
Hemoglobin: 7.2 g/dL — ABNORMAL LOW (ref 12.0–15.0)
MCH: 25.9 pg — ABNORMAL LOW (ref 26.0–34.0)
MCHC: 29.9 g/dL — ABNORMAL LOW (ref 30.0–36.0)
MCV: 86.7 fL (ref 80.0–100.0)
Platelets: 277 10*3/uL (ref 150–400)
RBC: 2.78 MIL/uL — ABNORMAL LOW (ref 3.87–5.11)
RDW: 21.1 % — ABNORMAL HIGH (ref 11.5–15.5)
WBC: 12.4 10*3/uL — ABNORMAL HIGH (ref 4.0–10.5)
nRBC: 0.3 % — ABNORMAL HIGH (ref 0.0–0.2)

## 2023-07-17 MED ORDER — HYDROMORPHONE HCL 1 MG/ML IJ SOLN
1.0000 mg | INTRAMUSCULAR | Status: AC | PRN
  Administered 2023-07-17 – 2023-07-18 (×3): 1 mg via INTRAVENOUS
  Filled 2023-07-17 (×3): qty 1

## 2023-07-17 MED ORDER — POLYETHYLENE GLYCOL 3350 17 G PO PACK
17.0000 g | PACK | Freq: Every day | ORAL | Status: DC
  Administered 2023-07-17: 17 g via ORAL
  Filled 2023-07-17: qty 1

## 2023-07-17 MED ORDER — SODIUM CHLORIDE 0.9 % IV SOLN
250.0000 mg | Freq: Once | INTRAVENOUS | Status: AC
  Administered 2023-07-17: 250 mg via INTRAVENOUS
  Filled 2023-07-17: qty 20

## 2023-07-17 MED ORDER — SENNA 8.6 MG PO TABS: 2.0000 | ORAL_TABLET | Freq: Every day | ORAL | Status: DC

## 2023-07-17 MED ORDER — OXYCODONE HCL 5 MG PO TABS
10.0000 mg | ORAL_TABLET | ORAL | Status: DC | PRN
  Administered 2023-07-17 – 2023-07-31 (×57): 10 mg via ORAL
  Filled 2023-07-17 (×57): qty 2

## 2023-07-17 MED ORDER — SENNOSIDES-DOCUSATE SODIUM 8.6-50 MG PO TABS
1.0000 | ORAL_TABLET | Freq: Two times a day (BID) | ORAL | Status: DC
  Administered 2023-07-17 – 2023-07-31 (×23): 1 via ORAL
  Filled 2023-07-17 (×25): qty 1

## 2023-07-17 MED ORDER — CEFAZOLIN SODIUM-DEXTROSE 2-4 GM/100ML-% IV SOLN
2.0000 g | Freq: Three times a day (TID) | INTRAVENOUS | Status: DC
  Administered 2023-07-17 – 2023-07-31 (×40): 2 g via INTRAVENOUS
  Filled 2023-07-17 (×41): qty 100

## 2023-07-17 MED ORDER — POLYETHYLENE GLYCOL 3350 17 G PO PACK
17.0000 g | PACK | Freq: Two times a day (BID) | ORAL | Status: DC
  Administered 2023-07-17 – 2023-07-29 (×15): 17 g via ORAL
  Filled 2023-07-17 (×21): qty 1

## 2023-07-17 MED ORDER — CEFAZOLIN IV (FOR PTA / DISCHARGE USE ONLY)
2.0000 g | Freq: Three times a day (TID) | INTRAVENOUS | 0 refills | Status: AC
Start: 2023-07-17 — End: 2023-08-25

## 2023-07-17 NOTE — Progress Notes (Addendum)
.  Subjective: 2 Days Post-Op Procedure(s) (LRB): IRRIGATION AND DEBRIDEMENT, OPEN FRACTURE (Right) REMOVAL, HARDWARE (Right)   Patient doing well in good spirits. Pain controlled   Activity level:  NWB RLE Diet tolerance:  as tolerated Patient reports pain as moderate.      Objective: Vital signs in last 24 hours: Temp:  [98.1 F (36.7 C)-98.7 F (37.1 C)] 98.1 F (36.7 C) (04/03 0735) Pulse Rate:  [65-76] 66 (04/03 0509) Resp:  [17-18] 17 (04/03 0735) BP: (139-157)/(67-88) 157/72 (04/03 1009) SpO2:  [98 %-100 %] 98 % (04/03 0735)  Labs: Recent Labs    07/14/23 1458 07/15/23 0405 07/16/23 0343 07/16/23 1359 07/17/23 0325  HGB 7.4* 7.1* 6.7* 7.6* 7.2*   Recent Labs    07/16/23 0343 07/16/23 1359 07/17/23 0325  WBC 14.5*  --  12.4*  RBC 2.53*  --  2.78*  HCT 22.5* 25.1* 24.1*  PLT 258  --  277   Recent Labs    07/15/23 0405 07/17/23 0325  NA 141 141  K 3.7 3.6  CL 109 105  CO2 23 25  BUN 10 12  CREATININE 0.81 0.79  GLUCOSE 89 107*  CALCIUM 8.4* 8.1*   No results for input(s): "LABPT", "INR" in the last 72 hours.  Physical Exam: Neuro intact, A&Ox3 Breathing comfortably on RA RLE Wound vac in place with good seal Motor intact EHL/FHL Senstaion intact SP/DP/T 2+ DP pulse  Assessment/Plan:  2 Days Post-Op Procedure(s) (LRB): IRRIGATION AND DEBRIDEMENT, OPEN FRACTURE (Right) REMOVAL, HARDWARE (Right)  NWB RLE Follow cultures, currently growing MSSA Continue IV abx Pain control as needed Trend CBC and transfuse as needed Trend WBC and CRP Patient would benefit to return to the OR for repeat I&D with potential wound closure vs Vac exchange. If wound is unable to be primarily closed then may need to consult plastic surgery for wound management.  Luci Bank 07/17/2023, 12:14 PM

## 2023-07-17 NOTE — Progress Notes (Signed)
.  Subjective: 1 Days Post-Op Procedure(s) (LRB): IRRIGATION AND DEBRIDEMENT, OPEN FRACTURE (Right) REMOVAL, HARDWARE (Right)  Patient doing well. Pain controlled  Activity level:  NWB RLE Diet tolerance:  as tolerated Patient reports pain as moderate.    Objective: Vital signs in last 24 hours: Temp:  [98.1 F (36.7 C)-98.7 F (37.1 C)] 98.1 F (36.7 C) (04/03 0735) Pulse Rate:  [65-76] 66 (04/03 0509) Resp:  [17-18] 17 (04/03 0735) BP: (139-157)/(67-88) 157/72 (04/03 1009) SpO2:  [98 %-100 %] 98 % (04/03 0735)  Labs: Recent Labs    07/14/23 1458 07/15/23 0405 07/16/23 0343 07/16/23 1359 07/17/23 0325  HGB 7.4* 7.1* 6.7* 7.6* 7.2*   Recent Labs    07/16/23 0343 07/16/23 1359 07/17/23 0325  WBC 14.5*  --  12.4*  RBC 2.53*  --  2.78*  HCT 22.5* 25.1* 24.1*  PLT 258  --  277   Recent Labs    07/15/23 0405 07/17/23 0325  NA 141 141  K 3.7 3.6  CL 109 105  CO2 23 25  BUN 10 12  CREATININE 0.81 0.79  GLUCOSE 89 107*  CALCIUM 8.4* 8.1*   No results for input(s): "LABPT", "INR" in the last 72 hours.  Physical Exam: Neuro intact, A&Ox3 Breathing comfortably on RA RLE Wound vac in place with good seal Motor intact EHL/FHL Senstaion intact SP/DP/T 2+ DP pulse  Assessment/Plan:  2 Days Post-Op Procedure(s) (LRB): IRRIGATION AND DEBRIDEMENT, OPEN FRACTURE (Right) REMOVAL, HARDWARE (Right)  NWB RLE Follow cultures, currently growing MSSA Continue IV abx Pain control as needed Trend CBC and transfuse as needed   Peggy House 07/16/2023, 5:15 PM

## 2023-07-17 NOTE — Progress Notes (Signed)
 Interval history Patient had Irrigation and debridement with wound vac application with ortho.All hardware removed.  She tolerated the procedure very well.Abx narrowed down to Vacomycin, awaiting susceptibility.   Patient was seen at bedside this morning. She was laying in bed in no acute distress. She said she feel better but still has right lower extremity pain that gets better with Oxycodone   Physical exam Blood pressure (!) 154/73, pulse 66, temperature 98.5 F (36.9 C), temperature source Oral, resp. rate 18, height 5\' 4"  (1.626 m), weight 105.2 kg, SpO2 98%.  Laying in bed  RLE dressing intact with wound vac in place  Unchanged cardiac exam  Lungs are clear to auscultation  Weight change:    Intake/Output Summary (Last 24 hours) at 07/17/2023 0641 Last data filed at 07/17/2023 0500 Gross per 24 hour  Intake 641 ml  Output 1200 ml  Net -559 ml   Net IO Since Admission: 894.33 mL [07/17/23 0641]  Labs, images, and other studies    Latest Ref Rng & Units 07/17/2023    3:25 AM 07/16/2023    1:59 PM 07/16/2023    3:43 AM  CBC  WBC 4.0 - 10.5 K/uL 12.4   14.5   Hemoglobin 12.0 - 15.0 g/dL 7.2  7.6  6.7   Hematocrit 36.0 - 46.0 % 24.1  25.1  22.5   Platelets 150 - 400 K/uL 277   258        Latest Ref Rng & Units 07/17/2023    3:25 AM 07/15/2023    4:05 AM 07/14/2023    2:19 AM  BMP  Glucose 70 - 99 mg/dL 161  89  096   BUN 8 - 23 mg/dL 12  10  8    Creatinine 0.44 - 1.00 mg/dL 0.45  4.09  8.11   Sodium 135 - 145 mmol/L 141  141  140   Potassium 3.5 - 5.1 mmol/L 3.6  3.7  3.2   Chloride 98 - 111 mmol/L 105  109  108   CO2 22 - 32 mmol/L 25  23  22    Calcium 8.9 - 10.3 mg/dL 8.1  8.4  8.4     Assessment and plan Hospital day 4  Peggy House is a 63 y.o. woman, history of hypertension, HHT, chronic anemia type 2 diabetes who presented with right ankle pain and admitted for wound dehiscence and anemia,s/p I&D with ortho on Cefazolin.  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 *  #Right ankle wound dehiscence with purulent discharge and surrounding cellulitis #Hx of prior malleolar ORIF #Leukocytosis - MSSA on Culture - Will switch Vancomycin to Cefazolin today  - Follow up on Ortho recs  - Optimize pain control  - May discharge with IV antibiotics.Discussions on PICC placement vs using her port  - Order HH needs   #Acute on chronic anemia #Epistaxis #HHT - Stable. No active  bleeding - Hgb 7.2 from 6.9  #Hypertension Blood pressure improved  - Continuing lisinopril 20 mg and amlodipine 10 mg   #GERD - Home PPI #Depression - Celexa 20 mg #Insomnia -  Home trazodone  50 mg   VTE prophylaxis: SCDs Start: 07/13/23 2028 Diet: Regular Diet IVF: None   Code: FULL  PT/OT recommendations: Home with home health  TOC recommendations: None  Family Update: To be updated   Discharge plan: I anticipate discharge 1-2 days    [name]  MD 07/17/2023, 6:41 AM  Pager: [phone] After 5pm or weekend: [phone]

## 2023-07-17 NOTE — TOC Progression Note (Incomplete)
 Transition of Care [facility]) - Progression Note    Patient Details  Name: Peggy House MRN: [id number] Date of Birth: Mar 22, 1961  [facility] CM/SW Contact  Epifanio Lesches, RN Phone Number: 07/17/2023, 2:42 PM  Clinical Narrative:      S/p IRRIGATION AND DEBRIDEMENT, OPEN FRACTURE (Right), REMOVAL, HARDWARE (Right), 4/1   Pt with potential need for prolong IV ABX. Pt agreeable to home health services. [facility] to provide pt with IV ABX therapy and HHRN. Referral made with [facility] for PT, OT, NA  services, acceptance pending.  Plan: Per MD pt will return to OR on Monday for repeat I&D and potential wound closure if possible.   07/18/2023 @1623   Call received from [facility] informing NCM they accepted pt for home health PT , OT and NA services.  TOC team following and will continue assisting with needs...  Expected Discharge Plan: Home w Home Health Services Barriers to Discharge: Continued Medical Work up  Expected Discharge Plan and Services   Discharge Planning Services: CM Consult   Living arrangements for the past 2 months: Apartment                 DME Arranged: Other see comment (IV ABX Therapy) DME Agency: Other - Comment ([facility]) Date DME Agency Contacted: 07/17/23 Time DME Agency Contacted: [phone] Representative spoke with at DME Agency: [name] Arranged: PT, RN HH Agency: [facility] Date HH Agency Contacted: 07/17/23 Time HH Agency Contacted: [phone] Representative spoke with at [facility] Agency: [name]   Social Determinants of Health (SDOH) Interventions SDOH Screenings   Food Insecurity: No Food Insecurity (07/14/2023)  Housing: Low Risk  (07/14/2023)  Transportation Needs: No Transportation Needs (07/14/2023)  Utilities: Not At Risk (07/14/2023)  Depression (PHQ2-9): High Risk (07/04/2023)  Tobacco Use: High Risk (07/15/2023)    Readmission Risk Interventions    04/18/2023    3:07 PM  04/15/2023    2:22 PM  Readmission Risk Prevention Plan  Transportation Screening Complete Complete  PCP or Specialist Appt within 5-7 Days  Complete  PCP or Specialist Appt within 3-5 Days Complete   Home Care Screening  Complete  Medication Review (RN CM)  Complete  HRI or Home Care Consult Complete   Social Work Consult for Recovery Care Planning/Counseling Complete   Palliative Care Screening Complete   Medication Review Oceanographer) Complete

## 2023-07-17 NOTE — Hospital Course (Addendum)
 Disposition and follow-up:   [name] was discharged from [facility] in Stable condition.  At the hospital follow up visit please address:   1.  Follow-up:             Peri implant MSSA R ankle infection: ensure patient has been receiving outpatient antibiotic therapy, has wound care with plastic surgery established, PT/OT have been coming. Evaluate for signs of worsening infection   2.  Labs / imaging needed at time of follow-up: None   3.  Pending labs/ test needing follow-up: None   4.  Medication Changes             Started: Oxycodone 10mg  q6h for 5 days, Robaxin 750mg  TID for 10 days             Stopped:             Changed:             Abx -    Cefazolin (OPAT)        End Date: 08/25/23   Follow-up Appointments:   Follow-up Information       Dillingham, Peggy Requena, DO Follow up in 10 day(s).   Specialty: Plastic Surgery Contact information: [address] [place] [address] [phone]                        [facility] on 4/24 at 9:45 with Dr [facility] Course by problem list:  Peri-implant MSSA infection of R ankle Patient s/p right bimalleolar ORIF on 04/24/23 presented with R ankle pain with purulent drainage and dehiscence from the lateral incision site. Vancomycin and ceftriaxone were initiated. Orthopedic surgery was consulted, who performed removal of hardware and I&D on 4/1 and 4/7. Antibiotics were narrowed to cefazolin based on wound cultures positive for MSSE. Excision of the wound and wound vac change was performed by plastic surgery on 4/14. Physical and occupational therapy teams recommended discharge home with HHPT/OT. A hospital bed was also ordered and delivered to her home.    Patient was discharged home on 4/16 with outpatient plastic surgery follow up for wound vac change planned for next week. OPAT has been established who will continue cefazolin with end date 08/25/23.     Acute on chronic  anemia Hereditary hemorrhagic telangiectasias Patient has a history of HHT with frequent episodes of mucosal bleeding. Patient received pRBC transfusions x 3 during admission for hemoglobin below goal of 8 established by hematology. Patient's home Amicar was continued during admission. Risks of bleeding vs post-op DVT were considered and it was decided to continue DVT ppx with lovenox during admission. No significant bleeding occurred.    HTN: home amlodipine, lisinopril were continued GERD: home pantoprazole was continued Depression: home citalopram, hydroxyzine were continued Insomnia: home trazodone was continued    Discharge Subjective: Patient was feeling very well this morning.  She continues to have some pain in her right foot, though it is tolerable with p.o. oxycodone.  She feels ready to go home today.  Discussed discharge planning including home health PT/OT, outpatient antibiotic therapy, DME including hospital bed, and wound care with follow-up in plastic surgery clinic next week.  All questions answered   Discharge Exam:   BP 139/72 (BP Location: Left Arm)   Pulse (!) 50   Temp 98.1 F (36.7 C) (Oral)   Resp 18   Ht 5\' 4"  (1.626 m)   Wt 106.1  kg   SpO2 99%   BMI 40.17 kg/m  Constitutional: well-appearing, in no acute distress Neurological: alert & oriented x 3 Skin: warm and dry Extremities: R foot in clean and dry bandage with wound vac in place   Pertinent Labs, Studies, and Procedures:      Latest Ref Rng & Units 07/29/2023    4:01 AM 07/28/2023    4:50 AM 07/23/2023    2:33 AM  CBC  WBC 4.0 - 10.5 K/uL 9.7  8.4  15.5   Hemoglobin 12.0 - 15.0 g/dL 8.3  7.7  7.9   Hematocrit 36.0 - 46.0 % 28.3  26.4  27.5   Platelets 150 - 400 K/uL 458  443  534           Latest Ref Rng & Units 07/29/2023    4:01 AM 07/28/2023    4:50 AM 07/22/2023    2:09 AM  CMP  Glucose 70 - 99 mg/dL 161  096  045   BUN 8 - 23 mg/dL 12  12  14    Creatinine 0.44 - 1.00 mg/dL 4.09  8.11  9.14    Sodium 135 - 145 mmol/L 140  140  137   Potassium 3.5 - 5.1 mmol/L 4.2  3.8  4.3   Chloride 98 - 111 mmol/L 109  108  101   CO2 22 - 32 mmol/L 23  25  24    Calcium 8.9 - 10.3 mg/dL 8.6  8.3  8.5     DG Ankle Complete Right Result Date: 07/13/2023 CLINICAL DATA:  Right ankle wound dehiscence and drainage. Recent surgery in early January. Concern for infection. EXAM: RIGHT ANKLE - COMPLETE 3+ VIEW COMPARISON:  Right ankle x-rays dated April 24, 2023. FINDINGS: Postsurgical changes related to bimalleolar ORIF with syndesmotic fixation. No evidence of hardware failure or loosening. Mild tibiotalar and midfoot degenerative changes. Diffuse soft tissue swelling. Superficial subcutaneous emphysema along the lateral hindfoot. IMPRESSION: 1. Diffuse soft tissue swelling with superficial subcutaneous emphysema along the lateral hindfoot, presumably related to wound dehiscence. 2. Postsurgical changes without hardware complication. No radiographic evidence of osteomyelitis. Electronically Signed   By: Aleta Anda M.D.   On: 07/13/2023 17:04    Signed: Jayson Michael, MD PGY-1 07/30/2023, 11:30 AM   Pager: [phone]

## 2023-07-17 NOTE — Progress Notes (Signed)
 PHARMACY CONSULT NOTE FOR:  OUTPATIENT  PARENTERAL ANTIBIOTIC THERAPY (OPAT)  Indication: R-ankle osteo Regimen: Cefazolin 2g IV every 8 hours End date: 08/25/23  IV antibiotic discharge orders are pended. To discharging provider:  please sign these orders via discharge navigator,  Select New Orders & click on the button choice - Manage This Unsigned Work.     Thank you for allowing pharmacy to be a part of this patient's care.  Georgina Pillion, PharmD, BCPS, BCIDP Infectious Diseases Clinical Pharmacist 07/17/2023 12:33 PM   **Pharmacist phone directory can now be found on [email or web address] ([email or web address]).  Listed under [facility].

## 2023-07-18 DIAGNOSIS — T8130XA Disruption of wound, unspecified, initial encounter: Secondary | ICD-10-CM

## 2023-07-18 DIAGNOSIS — T84629D Infection and inflammatory reaction due to internal fixation device of unspecified bone of leg, subsequent encounter: Secondary | ICD-10-CM

## 2023-07-18 DIAGNOSIS — M86171 Other acute osteomyelitis, right ankle and foot: Secondary | ICD-10-CM | POA: Diagnosis not present

## 2023-07-18 DIAGNOSIS — D649 Anemia, unspecified: Secondary | ICD-10-CM | POA: Diagnosis not present

## 2023-07-18 LAB — CULTURE, BLOOD (ROUTINE X 2)
Culture: NO GROWTH
Culture: NO GROWTH
Special Requests: ADEQUATE

## 2023-07-18 LAB — CBC
HCT: 25.7 % — ABNORMAL LOW (ref 36.0–46.0)
Hemoglobin: 7.5 g/dL — ABNORMAL LOW (ref 12.0–15.0)
MCH: 26.5 pg (ref 26.0–34.0)
MCHC: 29.2 g/dL — ABNORMAL LOW (ref 30.0–36.0)
MCV: 90.8 fL (ref 80.0–100.0)
Platelets: 279 10*3/uL (ref 150–400)
RBC: 2.83 MIL/uL — ABNORMAL LOW (ref 3.87–5.11)
RDW: 21.2 % — ABNORMAL HIGH (ref 11.5–15.5)
WBC: 10.6 10*3/uL — ABNORMAL HIGH (ref 4.0–10.5)
nRBC: 0.7 % — ABNORMAL HIGH (ref 0.0–0.2)

## 2023-07-18 LAB — GLUCOSE, CAPILLARY
Glucose-Capillary: 114 mg/dL — ABNORMAL HIGH (ref 70–99)
Glucose-Capillary: 119 mg/dL — ABNORMAL HIGH (ref 70–99)
Glucose-Capillary: 103 mg/dL — ABNORMAL HIGH (ref 70–99)

## 2023-07-18 LAB — C-REACTIVE PROTEIN: CRP: 10 mg/dL — ABNORMAL HIGH (ref <1.0)

## 2023-07-18 MED ORDER — HYDROMORPHONE HCL 1 MG/ML IJ SOLN
1.0000 mg | INTRAMUSCULAR | Status: DC | PRN
  Administered 2023-07-18 – 2023-07-19 (×2): 1 mg via INTRAVENOUS
  Filled 2023-07-18 (×2): qty 1

## 2023-07-18 MED FILL — Sod Ferric Gluc Cmplx in Sucrose IV Soln 12.5 MG/ML (Fe Eq): INTRAVENOUS | Qty: 20 | Status: AC

## 2023-07-18 NOTE — Progress Notes (Signed)
.  Subjective: 3 Days Post-Op Procedure(s) (LRB): IRRIGATION AND DEBRIDEMENT, OPEN FRACTURE (Right) REMOVAL, HARDWARE (Right)  Patient doing well in good spirits. Pain controlled and improving   Activity level:  NWB RLE Diet tolerance:  as tolerated Patient reports pain as moderate.    Objective: Vital signs in last 24 hours: Temp:  [98.4 F (36.9 C)-98.9 F (37.2 C)] 98.9 F (37.2 C) (04/04 0812) Pulse Rate:  [58-66] 60 (04/04 0812) Resp:  [15-19] 15 (04/04 0812) BP: (136-162)/(65-75) 162/65 (04/04 0959) SpO2:  [94 %-100 %] 98 % (04/04 0812)  Labs: Recent Labs    07/16/23 0343 07/16/23 1359 07/17/23 0325 07/18/23 0826  HGB 6.7* 7.6* 7.2* 7.5*   Recent Labs    07/17/23 0325 07/18/23 0826  WBC 12.4* 10.6*  RBC 2.78* 2.83*  HCT 24.1* 25.7*  PLT 277 279   Recent Labs    07/17/23 0325  NA 141  K 3.6  CL 105  CO2 25  BUN 12  CREATININE 0.79  GLUCOSE 107*  CALCIUM 8.1*   No results for input(s): "LABPT", "INR" in the last 72 hours.  Physical Exam: Neuro intact, A&Ox3 Breathing comfortably on RA RLE Wound vac in place with good seal Motor intact EHL/FHL Senstaion intact SP/DP/T 2+ DP pulse  Assessment/Plan:  3 Days Post-Op Procedure(s) (LRB): IRRIGATION AND DEBRIDEMENT, OPEN FRACTURE (Right) REMOVAL, HARDWARE (Right) NWB RLE Follow cultures, currently growing MSSA Continue IV abx, now on Cefazolin Pain control as needed Trend Hgb and transfuse as needed Trend WBC and CRP. Leukocytosis and CRP improving Plan to return to OR on Monday for repeat I&D and potential wound closure if possible. If wound is unable to be primarily closed then may need to consult plastic surgery for wound management.   Luci Bank 07/18/2023, 12:26 PM

## 2023-07-18 NOTE — Progress Notes (Addendum)
 HD#5 SUBJECTIVE:  Patient Summary:Peggy House is a 63 y.o. woman, history of hypertension, HHT, chronic anemia type 2 diabetes who presented with right ankle pain and admitted for wound dehiscence and anemia,s/p I&D with ortho on Cefazolin.   Overnight Events: She got an IV iron yesterday. Follow up CBC showed Hgb of 7.5  Interim History: Patient was seen at bedside this morning, she was sleeping in bed.  She had concerns about her possible discharge.  She denies any chest pain, shortness of breath.  She continues to endorse RLE pain.  OBJECTIVE:  Vital Signs: Vitals:   07/17/23 1943 07/18/23 0434 07/18/23 0812 07/18/23 0959  BP: (!) 155/74 (!) 142/67 (!) 155/75 (!) 162/65  Pulse: 63 (!) 58 60   Resp: 19 18 15    Temp: 98.9 F (37.2 C) 98.6 F (37 C) 98.9 F (37.2 C)   TempSrc: Oral Oral Oral   SpO2: 100% 94% 98%   Weight:      Height:       Supplemental O2: Room Air SpO2: 98 % O2 Flow Rate (L/min): 99 L/min  Filed Weights   07/13/23 1540  Weight: 105.2 kg     Intake/Output Summary (Last 24 hours) at 07/18/2023 1140 Last data filed at 07/18/2023 0045 Gross per 24 hour  Intake 240 ml  Output 875 ml  Net -635 ml   Net IO Since Admission: 9.33 mL [07/18/23 1140]  Physical Exam: NAD Cardiac exam remained unchanged Lungs are clear to auscultation Right lower extremity sling in place with clean and dry dressing.  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) Active Problems:   Hypertension   Diabetes mellitus (HCC)   Hereditary hemorrhagic telangiectasia (HCC)   Acute anemia   Wound dehiscence   Subacute osteomyelitis of right ankle (HCC)   Pyogenic inflammation of bone (HCC)   Plan: # 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 #Wound dehiscence #Subacute osteomyelitis of right ankle - Continue cefazolin - Continue to optimize pain control - Plans to discharge on IV antibiotics through her port, no PICC line needed - Plans to return to the OR for repeat I&D with potential wound closure versus VAC exchange by Ortho.  May need plastic surgery assistance for wound management. F/U on ortho's recs   # Acute on chronic anemia #Hereditary hemorrhagic telangiectasis - Patient has a history of chronic blood loss, follows up with hematology, has a port in place for frequent blood transfusions. Received IV iron yesterday. - Heme-onc assistance greatly appreciated - Hemoglobin 7.2, no active bleeding stable - Trend CBC, transfuse with a goal hgb > 7   # Hypertension - Continue home lisinopril and amlodipine   #GERD - Continuing home PPI #Depression - Continue home Celexa 20 mg #Insomnia - Continue home trazodone 50 mg  Best Practice: Diet: Regular diet  IVF: Fluids: none, Rate: None VTE: SCDs Start: 07/13/23 2028 Code: Full AB: Cefazolin Therapy Recs: Home Health, DME: other TBD Family Contact: , to be notified. DISPO: Anticipated discharge in 2 days days to Home pending IV antibiotics, Wound care, and Weight bearing restrictions.  Signature: [name] , MD Internal Medicine Resident, PGY-1 [facility]  Pager: [phone] 11:40 AM, 07/18/2023   Please contact the on call pager after 5 pm and on weekends at [phone].

## 2023-07-18 NOTE — Progress Notes (Signed)
 Physical Therapy Treatment Patient Details Name: Peggy House MRN: [id number] DOB: 1960/06/17 Today's Date: 07/18/2023   History of Present Illness Peggy House is a 63 y.o. female admitted 3/30 with dehiscence of her incisions from right ankle ORIF in Jan.  Pt has been weight bearing at times on right foot and developed drainage and cellulitis.  I&D right ankle with wound VAC placement 4/1.   PMH of HHT, chronic anemia, HTN, T2DM, hx of GI bleed, tobacco use,  Right ankle ORIF on 04/24/23 with SNF rehab stay through mid Feb.    PT Comments  Pt admitted with above diagnosis. Pt was able to perform bed mobility with +1 min assist and transfers with +1  CGA with verbal and tactile cues.  Pt continues to be able to maintain NWB right LE during mobility.  Will continue to work on progression to Modif I transfers and safety awareness.  Will continue PT. Pt currently with functional limitations due to the deficits listed below (see PT Problem List). Pt will benefit from acute skilled PT to increase their independence and safety with mobility to allow discharge.       If plan is discharge home, recommend the following: A little help with walking and/or transfers;A little help with bathing/dressing/bathroom;Assistance with cooking/housework;Assist for transportation;Help with stairs or ramp for entrance   Can travel by private vehicle        Equipment Recommendations  None recommended by PT    Recommendations for Other Services       Precautions / Restrictions Precautions Precautions: Fall Precaution/Restrictions Comments: VAC in place     Mobility  Bed Mobility Overal bed mobility: Needs Assistance Bed Mobility: Rolling, Sidelying to Sit Rolling: Min assist Sidelying to sit: Min assist       General bed mobility comments: Pt needed assist to move left LE with cues for rolling and side to sit.  No assist needed for trunk elevation with guidance at shoulder to have pt come  to sitting.  With incr time and cues, pt able to scoot to EOB. Pt reported dizziness when seated at EOB. PT asked pt to perform ankle and wrist pumps. Orthosdtatics were taken right after the reported dizziness. BP was 146/75 and HR was 64 bpm    Transfers Overall transfer level: Needs assistance Equipment used: None Transfers: Bed to chair/wheelchair/BSC            Lateral/Scoot Transfers: Contact guard assist, From elevated surface General transfer comment: Pt was able to scoot from EOB to chair using verbal and tactile cues. Pt also needed blocking of left LE with pt able to scoot to chair without physical assist.  Pt able to maintain NWB right LE without assist and without cues.    Ambulation/Gait               General Gait Details: N/A as pt will need to be wheelchair level.   Stairs             Wheelchair Mobility     Tilt Bed    Modified Rankin (Stroke Patients Only)       Balance   Sitting-balance support: Bilateral upper extremity supported, Feet supported Sitting balance-Leahy Scale: Poor Sitting balance - Comments: Supervision to sit EOB with good safety awareness.  Tends to lean posteriorly. Postural control: Posterior lean  Communication Communication Communication: No apparent difficulties  Cognition Arousal: Alert Behavior During Therapy: WFL for tasks assessed/performed   PT - Cognitive impairments: No apparent impairments                         Following commands: Intact      Cueing Cueing Techniques: Verbal cues, Tactile cues  Exercises General Exercises - Lower Extremity Ankle Circles/Pumps: AROM, Left, 5 reps, Supine Heel Slides: AROM, Both, 5 reps, Supine Hip ABduction/ADduction: AROM, Both, 5 reps, Sidelying, Supine Straight Leg Raises: AROM, Both, 5 reps Other Exercises Other Exercises: Wiggles toes on right foot    General Comments General comments (skin  integrity, edema, etc.): Orthostatics were taken after pt reporting dizziness when sitting at the EOB. BP was 146/75 and HR was 64 bpm      Pertinent Vitals/Pain Pain Assessment Pain Assessment: Faces Pain Score: 9  Pain Location: Right LE Pain Descriptors / Indicators: Aching, Discomfort, Grimacing, Guarding Pain Intervention(s): Limited activity within patient's tolerance, Monitored during session, Repositioned, Premedicated before session    Home Living                          Prior Function            PT Goals (current goals can now be found in the care plan section) Progress towards PT goals: Progressing toward goals    Frequency    Min 2X/week      PT Plan      Co-evaluation              AM-PAC PT "6 Clicks" Mobility   Outcome Measure  Help needed turning from your back to your side while in a flat bed without using bedrails?: A Little Help needed moving from lying on your back to sitting on the side of a flat bed without using bedrails?: A Little Help needed moving to and from a bed to a chair (including a wheelchair)?: A Little Help needed standing up from a chair using your arms (e.g., wheelchair or bedside chair)?: A Little Help needed to walk in hospital room?: Total Help needed climbing 3-5 steps with a railing? : Total 6 Click Score: 14    End of Session Equipment Utilized During Treatment: Gait belt Activity Tolerance: No increased pain;Patient tolerated treatment well Patient left: with call bell/phone within reach;with chair alarm set;in chair Nurse Communication: Mobility status PT Visit Diagnosis: Muscle weakness (generalized) (M62.81);Dizziness and giddiness (R42);Pain;Other abnormalities of gait and mobility (R26.89) Pain - Right/Left: Right Pain - part of body: Leg;Ankle and joints of foot     Time: 1610-9604 PT Time Calculation (min) (ACUTE ONLY): 33 min  Charges:    $Therapeutic Exercise: 8-22 mins $Therapeutic  Activity: 8-22 mins PT General Charges $$ ACUTE PT VISIT: 1 Visit                     Tymeer Vaquera M,PT Acute Rehab Services [phone]    [name] 07/18/2023, 10:27 AM

## 2023-07-18 NOTE — Plan of Care (Signed)
  Problem: Education: Goal: Knowledge of General Education information will improve Description: Including pain rating scale, medication(s)/side effects and non-pharmacologic comfort measures Outcome: Progressing   Problem: Health Behavior/Discharge Planning: Goal: Ability to manage health-related needs will improve Outcome: Progressing   Problem: Activity: Goal: Risk for activity intolerance will decrease Outcome: Progressing   Problem: Elimination: Goal: Will not experience complications related to bowel motility Outcome: Progressing Goal: Will not experience complications related to urinary retention Outcome: Progressing   Problem: Pain Managment: Goal: General experience of comfort will improve and/or be controlled Outcome: Progressing   Problem: Safety: Goal: Ability to remain free from injury will improve Outcome: Progressing   Problem: Skin Integrity: Goal: Risk for impaired skin integrity will decrease Outcome: Progressing

## 2023-07-19 ENCOUNTER — Inpatient Hospital Stay

## 2023-07-19 DIAGNOSIS — M86171 Other acute osteomyelitis, right ankle and foot: Secondary | ICD-10-CM | POA: Diagnosis not present

## 2023-07-19 DIAGNOSIS — D649 Anemia, unspecified: Secondary | ICD-10-CM | POA: Diagnosis not present

## 2023-07-19 DIAGNOSIS — T8130XA Disruption of wound, unspecified, initial encounter: Secondary | ICD-10-CM | POA: Diagnosis not present

## 2023-07-19 DIAGNOSIS — T84629D Infection and inflammatory reaction due to internal fixation device of unspecified bone of leg, subsequent encounter: Secondary | ICD-10-CM | POA: Diagnosis not present

## 2023-07-19 LAB — CBC
HCT: 25.6 % — ABNORMAL LOW (ref 36.0–46.0)
Hemoglobin: 7.7 g/dL — ABNORMAL LOW (ref 12.0–15.0)
MCH: 26.3 pg (ref 26.0–34.0)
MCHC: 30.1 g/dL (ref 30.0–36.0)
MCV: 87.4 fL (ref 80.0–100.0)
Platelets: 308 10*3/uL (ref 150–400)
RBC: 2.93 MIL/uL — ABNORMAL LOW (ref 3.87–5.11)
RDW: 20.6 % — ABNORMAL HIGH (ref 11.5–15.5)
WBC: 12.1 10*3/uL — ABNORMAL HIGH (ref 4.0–10.5)
nRBC: 0.4 % — ABNORMAL HIGH (ref 0.0–0.2)

## 2023-07-19 LAB — GLUCOSE, CAPILLARY
Glucose-Capillary: 95 mg/dL (ref 70–99)
Glucose-Capillary: 112 mg/dL — ABNORMAL HIGH (ref 70–99)
Glucose-Capillary: 113 mg/dL — ABNORMAL HIGH (ref 70–99)

## 2023-07-19 MED ORDER — HYDROMORPHONE HCL 1 MG/ML IJ SOLN
1.0000 mg | INTRAMUSCULAR | Status: DC | PRN
  Administered 2023-07-20: 1 mg via INTRAVENOUS
  Filled 2023-07-19: qty 1

## 2023-07-19 NOTE — Progress Notes (Signed)
 Interval history Patient seen at bedside this morning. She was laying in bed in no acute distress. Her pain is well controlled with IV dilaudid .   NAEO  Physical exam Blood pressure (!) 158/78, pulse (!) 59, temperature 98.9 F (37.2 C), temperature source Oral, resp. rate 16, height 5\' 4"  (1.626 m), weight 105.2 kg, SpO2 100%.  NAD Unremarkable heart exam. RRR Lungs are clear to ascultation  RLE in sling,dressing clean , dry and intact   Weight change:    Intake/Output Summary (Last 24 hours) at 07/19/2023 0924 Last data filed at 07/19/2023 0454 Gross per 24 hour  Intake 480 ml  Output 1950 ml  Net -1470 ml   Net IO Since Admission: -1,220.67 mL [07/19/23 0924]  Labs, images, and other studies    Latest Ref Rng & Units 07/19/2023    2:45 AM 07/18/2023    8:26 AM 07/17/2023    3:25 AM  CBC  WBC 4.0 - 10.5 K/uL 12.1  10.6  12.4   Hemoglobin 12.0 - 15.0 g/dL 7.7  7.5  7.2   Hematocrit 36.0 - 46.0 % 25.6  25.7  24.1   Platelets 150 - 400 K/uL 308  279  277        Latest Ref Rng & Units 07/17/2023    3:25 AM 07/15/2023    4:05 AM 07/14/2023    2:19 AM  CMP  Glucose 70 - 99 mg/dL 098  89  119   BUN 8 - 23 mg/dL 12  10  8    Creatinine 0.44 - 1.00 mg/dL 1.47  8.29  5.62   Sodium 135 - 145 mmol/L 141  141  140   Potassium 3.5 - 5.1 mmol/L 3.6  3.7  3.2   Chloride 98 - 111 mmol/L 105  109  108   CO2 22 - 32 mmol/L 25  23  22    Calcium 8.9 - 10.3 mg/dL 8.1  8.4  8.4      Assessment and plan Hospital day 6  Peggy House is a 63 y.o. woman, history of hypertension, HHT, chronic anemia type 2 diabetes who presented with right ankle pain and admitted for wound dehiscence and anemia,s/p I&D with ortho on Cefazolin. 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 *  # 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 #Wound dehiscence #Subacute osteomyelitis of right ankle - Continuing antibiotics today - Awaiting RTOR on 4/7 with ortho  - Pain control with IV Dilaudid ( Monitor for bowel movement ) - Wound care    # Acute on chronic anemia #Hereditary hemorrhagic telangiectasis - Stable ,Hgb of 7.7 - No signs of active bleeding  # Hypertension - Continue home lisinopril and amlodipine     #GERD - Continuing home PPI #Depression - Continue home Celexa 20 mg #Insomnia - Continue home trazodone 50 mg   VTE prophylaxis: SCDs Start: 07/13/23 2028 Diet: Regular  IVF: None  Code: FULL PT/OT recommendations: Home with HH TOC recommendations: None Family Update: Son to  be updated   Discharge plan: Awaiting RTOR with ortho     [name]  MD 07/19/2023, 9:24 AM  Pager: [phone] After 5pm or weekend: [phone]

## 2023-07-19 NOTE — Progress Notes (Deleted)
 To OR

## 2023-07-19 NOTE — Progress Notes (Signed)
 Mobility Specialist Progress Note:   07/19/23 1243  Mobility  Activity Transferred from bed to chair  Level of Assistance Standby assist, set-up cues, supervision of patient - no hands on  Assistive Device None  RLE Weight Bearing Per Provider Order NWB  Activity Response Tolerated well  Mobility Referral Yes  Mobility visit 1 Mobility  Mobility Specialist Start Time (ACUTE ONLY) 1205  Mobility Specialist Stop Time (ACUTE ONLY) 1220  Mobility Specialist Time Calculation (min) (ACUTE ONLY) 15 min   Pt received in bed, agreeable to transfer to chair. Pt was able to lateral scoot to chair w/o physical assist. Pt c/o RLE pain, otherwise asx throughout. Pt left in chair with call bell in reach and all needs met.   Leory Plowman  Mobility Specialist Please contact via [employer] office at [phone]

## 2023-07-20 DIAGNOSIS — T8130XA Disruption of wound, unspecified, initial encounter: Secondary | ICD-10-CM | POA: Diagnosis not present

## 2023-07-20 DIAGNOSIS — M86171 Other acute osteomyelitis, right ankle and foot: Secondary | ICD-10-CM | POA: Diagnosis not present

## 2023-07-20 DIAGNOSIS — T84629D Infection and inflammatory reaction due to internal fixation device of unspecified bone of leg, subsequent encounter: Secondary | ICD-10-CM | POA: Diagnosis not present

## 2023-07-20 DIAGNOSIS — D649 Anemia, unspecified: Secondary | ICD-10-CM | POA: Diagnosis not present

## 2023-07-20 LAB — AEROBIC/ANAEROBIC CULTURE W GRAM STAIN (SURGICAL/DEEP WOUND)
Gram Stain: NONE SEEN
Gram Stain: NONE SEEN

## 2023-07-20 LAB — CBC
HCT: 25.9 % — ABNORMAL LOW (ref 36.0–46.0)
Hemoglobin: 7.7 g/dL — ABNORMAL LOW (ref 12.0–15.0)
MCH: 26.1 pg (ref 26.0–34.0)
MCHC: 29.7 g/dL — ABNORMAL LOW (ref 30.0–36.0)
MCV: 87.8 fL (ref 80.0–100.0)
Platelets: 342 10*3/uL (ref 150–400)
RBC: 2.95 MIL/uL — ABNORMAL LOW (ref 3.87–5.11)
RDW: 20.9 % — ABNORMAL HIGH (ref 11.5–15.5)
WBC: 11.7 10*3/uL — ABNORMAL HIGH (ref 4.0–10.5)
nRBC: 0.2 % (ref 0.0–0.2)

## 2023-07-20 LAB — BASIC METABOLIC PANEL WITH GFR
Anion gap: 10 (ref 5–15)
BUN: 6 mg/dL — ABNORMAL LOW (ref 8–23)
CO2: 28 mmol/L (ref 22–32)
Calcium: 8.7 mg/dL — ABNORMAL LOW (ref 8.9–10.3)
Chloride: 103 mmol/L (ref 98–111)
Creatinine, Ser: 0.71 mg/dL (ref 0.44–1.00)
GFR, Estimated: >60 mL/min (ref >60)
Glucose, Bld: 105 mg/dL — ABNORMAL HIGH (ref 70–99)
Potassium: 3.6 mmol/L (ref 3.5–5.1)
Sodium: 141 mmol/L (ref 135–145)

## 2023-07-20 MED ORDER — HYDROXYZINE HCL 25 MG PO TABS
25.0000 mg | ORAL_TABLET | Freq: Three times a day (TID) | ORAL | Status: DC | PRN
  Administered 2023-07-20 – 2023-07-31 (×13): 25 mg via ORAL
  Filled 2023-07-20 (×13): qty 1

## 2023-07-20 MED ORDER — HYDROMORPHONE HCL 1 MG/ML IJ SOLN
1.0000 mg | INTRAMUSCULAR | Status: AC | PRN
  Administered 2023-07-20 – 2023-07-21 (×4): 1 mg via INTRAVENOUS
  Filled 2023-07-20 (×4): qty 1

## 2023-07-20 NOTE — Plan of Care (Signed)

## 2023-07-20 NOTE — Progress Notes (Signed)
 HD#7 SUBJECTIVE:  Patient Summary: Peggy House is a 63 y.o. with a pertinent PMH of HHT, chronic anemia, HTN, T2DM, recent R ankle fracture with ORIF, who presented with wound dehiscence at R ankle and acute anemia and admitted for MSSA infection of R ankle.   Overnight Events: NAEON  Interim History: Pain management is an ongoing concern for Peggy House but she is tolerating the current management well. No questions or concerns at this time.  OBJECTIVE:  Vital Signs: Vitals:   07/19/23 0814 07/19/23 1505 07/19/23 2038 07/20/23 0540  BP: (!) 158/78 (!) 146/71 (!) 154/67 (!) 143/70  Pulse: (!) 59 63 63 (!) 58  Resp: 16 16 18 18   Temp: 98.9 F (37.2 C) 98.4 F (36.9 C) 98.3 F (36.8 C) 98.5 F (36.9 C)  TempSrc: Oral Oral  Oral  SpO2: 100% 96% 98% 94%  Weight:      Height:       Supplemental O2: Room Air SpO2: 94 % O2 Flow Rate (L/min): 99 L/min  Filed Weights   07/13/23 1540  Weight: 105.2 kg     Intake/Output Summary (Last 24 hours) at 07/20/2023 0709 Last data filed at 07/20/2023 0540 Gross per 24 hour  Intake 720 ml  Output 2840 ml  Net -2120 ml   Net IO Since Admission: -3,340.67 mL [07/20/23 0709]  Physical Exam: Constitutional:Pleasant female resting comfortably in bed. In no acute distress. Cardio:Regular rate and rhythm. No murmurs, rubs, or gallops. Pulm:Normal work of breathing on room air. MSK/Skin:Negative for extremity edema. No tenderness to palpation of posterior RLE, which is wrapped in ace bandage. Skin is warm and dry. Neuro:Alert and oriented x3. No focal deficit noted. Psych:Pleasant mood and affect.  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 with a pertinent PMH of HHT, chronic anemia, HTN, T2DM, recent R ankle fracture with ORIF, who presented with wound dehiscence at R ankle and acute anemia and admitted for MSSA infection of R ankle.   Plan:  Peri-implant MSSA infection of R ankle S/p I & D with orthopedic surgery 04/01. Afebrile overnight, leukocytosis stable at 11.7. VSS. Pain persists but seems to managed well with current pain regimen. Wound cultures positive for MSSA bacteremia.  Plan: -Orthopedic surgery consulted, appreciate their assistance  -NPO at midnight for repeat I&D 04/07 -Pain control with tylenol 650 mg q6h, gabapentin 100 mg daily at bedtime, dilaudid 1 mg IV q3h PRN severe pain, oxycodone 10 mg q3h PRN severe pain -Bowel regimen: miralax 17 g BID, senna-docusate 1 tablet BID -Continue cefazolin 2 g IV q8h  Acute on chronic anemia Hereditary hemorrhagic telangiectasis Hgb stable at 7.7. No signs of bleeding at this time. Plan: -Trend CBC, transfuse for Hgb <7 -Continue amicar 1000 mg PO q12h   Hypertension Plan: -Continue home amlodipine 10 mg daily, lisinopril 20 mg daily    GERD Plan: -Continue home pantoprazole 40 mg daily  Depression Anxiety Requesting restart of home xanax today, per nurse she states she takes it 2-3 times weekly. On PDMP this has not been filled since August 2024, and at time of admission she stated she did not take the  medication. Plan: -Continue home citalopram 20 mg daily -Will order hydroxyzine 25 mg PO TID PRN anxiety  Insomnia Plan: -Continue home trazodone 50 mg daily at bedtime PRN insomnia  Best Practice: Diet: Regular diet IVF: None VTE: SCDs Start: 07/13/23 2028 Code: Full AB: Cefazolin  Signature: Champ Mungo, D.O.  Internal Medicine Resident, PGY-3 [facility]  Pager: [phone]   Please contact the on call pager after 5 pm and on weekends at [phone].

## 2023-07-21 ENCOUNTER — Encounter (HOSPITAL_COMMUNITY)
Admission: EM | Disposition: A | Discharge status: Home Health | Payer: Self-pay | Source: Home / Self Care | Attending: Internal Medicine

## 2023-07-21 ENCOUNTER — Encounter (HOSPITAL_COMMUNITY): Payer: Self-pay | Admitting: Infectious Diseases

## 2023-07-21 ENCOUNTER — Inpatient Hospital Stay (HOSPITAL_COMMUNITY): Admitting: Certified Registered Nurse Anesthetist

## 2023-07-21 ENCOUNTER — Other Ambulatory Visit: Payer: Self-pay

## 2023-07-21 DIAGNOSIS — E1169 Type 2 diabetes mellitus with other specified complication: Secondary | ICD-10-CM | POA: Diagnosis not present

## 2023-07-21 DIAGNOSIS — F17210 Nicotine dependence, cigarettes, uncomplicated: Secondary | ICD-10-CM | POA: Diagnosis not present

## 2023-07-21 DIAGNOSIS — M86171 Other acute osteomyelitis, right ankle and foot: Secondary | ICD-10-CM | POA: Diagnosis not present

## 2023-07-21 DIAGNOSIS — I10 Essential (primary) hypertension: Secondary | ICD-10-CM

## 2023-07-21 DIAGNOSIS — T84629A Infection and inflammatory reaction due to internal fixation device of unspecified bone of leg, initial encounter: Secondary | ICD-10-CM

## 2023-07-21 DIAGNOSIS — L089 Local infection of the skin and subcutaneous tissue, unspecified: Secondary | ICD-10-CM

## 2023-07-21 DIAGNOSIS — F418 Other specified anxiety disorders: Secondary | ICD-10-CM | POA: Diagnosis not present

## 2023-07-21 DIAGNOSIS — S91001A Unspecified open wound, right ankle, initial encounter: Secondary | ICD-10-CM | POA: Diagnosis not present

## 2023-07-21 HISTORY — PX: INCISION AND DRAINAGE OF WOUND: SHX1803

## 2023-07-21 LAB — BASIC METABOLIC PANEL WITH GFR
Anion gap: 7 (ref 5–15)
BUN: 10 mg/dL (ref 8–23)
CO2: 28 mmol/L (ref 22–32)
Calcium: 8.2 mg/dL — ABNORMAL LOW (ref 8.9–10.3)
Chloride: 104 mmol/L (ref 98–111)
Creatinine, Ser: 0.85 mg/dL (ref 0.44–1.00)
GFR, Estimated: >60 mL/min (ref >60)
Glucose, Bld: 111 mg/dL — ABNORMAL HIGH (ref 70–99)
Potassium: 3.8 mmol/L (ref 3.5–5.1)
Sodium: 139 mmol/L (ref 135–145)

## 2023-07-21 LAB — GLUCOSE, CAPILLARY
Glucose-Capillary: 90 mg/dL (ref 70–99)
Glucose-Capillary: 105 mg/dL — ABNORMAL HIGH (ref 70–99)
Glucose-Capillary: 127 mg/dL — ABNORMAL HIGH (ref 70–99)

## 2023-07-21 LAB — CBC
HCT: 27 % — ABNORMAL LOW (ref 36.0–46.0)
Hemoglobin: 7.8 g/dL — ABNORMAL LOW (ref 12.0–15.0)
MCH: 26 pg (ref 26.0–34.0)
MCHC: 28.9 g/dL — ABNORMAL LOW (ref 30.0–36.0)
MCV: 90 fL (ref 80.0–100.0)
Platelets: 392 10*3/uL (ref 150–400)
RBC: 3 MIL/uL — ABNORMAL LOW (ref 3.87–5.11)
RDW: 21.1 % — ABNORMAL HIGH (ref 11.5–15.5)
WBC: 12.3 10*3/uL — ABNORMAL HIGH (ref 4.0–10.5)
nRBC: 0 % (ref 0.0–0.2)

## 2023-07-21 LAB — TYPE AND SCREEN
ABO/RH(D): A NEG
Antibody Screen: NEGATIVE

## 2023-07-21 SURGERY — IRRIGATION AND DEBRIDEMENT WOUND
Anesthesia: General | Site: Ankle | Laterality: Right

## 2023-07-21 MED ORDER — MIDAZOLAM HCL 2 MG/2ML IJ SOLN
INTRAMUSCULAR | Status: AC
  Filled 2023-07-21: qty 2

## 2023-07-21 MED ORDER — OXYCODONE HCL 5 MG PO TABS: 5.0000 mg | ORAL_TABLET | Freq: Once | ORAL | Status: AC | PRN

## 2023-07-21 MED ORDER — FENTANYL CITRATE (PF) 100 MCG/2ML IJ SOLN
INTRAMUSCULAR | Status: AC
  Filled 2023-07-21: qty 2

## 2023-07-21 MED ORDER — PROPOFOL 10 MG/ML IV BOLUS
INTRAVENOUS | Status: AC
  Filled 2023-07-21: qty 20

## 2023-07-21 MED ORDER — ONDANSETRON HCL 4 MG/2ML IJ SOLN
INTRAMUSCULAR | Status: AC
  Filled 2023-07-21: qty 2

## 2023-07-21 MED ORDER — FENTANYL CITRATE (PF) 250 MCG/5ML IJ SOLN
INTRAMUSCULAR | Status: AC
  Filled 2023-07-21: qty 5

## 2023-07-21 MED ORDER — LACTATED RINGERS IV SOLN: INTRAVENOUS | Status: DC

## 2023-07-21 MED ORDER — FENTANYL CITRATE (PF) 250 MCG/5ML IJ SOLN
INTRAMUSCULAR | Status: DC | PRN
Start: 2023-07-21 — End: 2023-07-21
  Administered 2023-07-21 (×4): 50 ug via INTRAVENOUS

## 2023-07-21 MED ORDER — OXYCODONE HCL 5 MG/5ML PO SOLN
5.0000 mg | Freq: Once | ORAL | Status: AC | PRN
  Administered 2023-07-21: 5 mg via ORAL

## 2023-07-21 MED ORDER — PROPOFOL 10 MG/ML IV BOLUS
INTRAVENOUS | Status: DC | PRN
  Administered 2023-07-21: 80 mg via INTRAVENOUS
  Administered 2023-07-21: 100 mg via INTRAVENOUS

## 2023-07-21 MED ORDER — DEXAMETHASONE SODIUM PHOSPHATE 10 MG/ML IJ SOLN
INTRAMUSCULAR | Status: DC | PRN
  Administered 2023-07-21: 5 mg via INTRAVENOUS

## 2023-07-21 MED ORDER — OXYCODONE HCL 5 MG/5ML PO SOLN
ORAL | Status: AC
  Filled 2023-07-21: qty 5

## 2023-07-21 MED ORDER — DEXAMETHASONE SODIUM PHOSPHATE 10 MG/ML IJ SOLN
INTRAMUSCULAR | Status: AC
  Filled 2023-07-21: qty 1

## 2023-07-21 MED ORDER — 0.9 % SODIUM CHLORIDE (POUR BTL) OPTIME
TOPICAL | Status: DC | PRN
  Administered 2023-07-21: 1000 mL

## 2023-07-21 MED ORDER — ORAL CARE MOUTH RINSE: 15.0000 mL | Freq: Once | OROMUCOSAL | Status: AC

## 2023-07-21 MED ORDER — ONDANSETRON HCL 4 MG/2ML IJ SOLN
INTRAMUSCULAR | Status: DC | PRN
  Administered 2023-07-21: 4 mg via INTRAVENOUS

## 2023-07-21 MED ORDER — SODIUM CHLORIDE 0.9 % IR SOLN
Status: DC | PRN
  Administered 2023-07-21: 3000 mL

## 2023-07-21 MED ORDER — ONDANSETRON HCL 4 MG/2ML IJ SOLN: 4.0000 mg | Freq: Four times a day (QID) | INTRAMUSCULAR | Status: DC | PRN

## 2023-07-21 MED ORDER — CHLORHEXIDINE GLUCONATE 0.12 % MT SOLN
OROMUCOSAL | Status: AC
  Administered 2023-07-21: 15 mL via OROMUCOSAL
  Filled 2023-07-21: qty 15

## 2023-07-21 MED ORDER — CHLORHEXIDINE GLUCONATE 0.12 % MT SOLN: 15.0000 mL | Freq: Once | OROMUCOSAL | Status: AC

## 2023-07-21 MED ORDER — MIDAZOLAM HCL 5 MG/5ML IJ SOLN
INTRAMUSCULAR | Status: DC | PRN
  Administered 2023-07-21: 2 mg via INTRAVENOUS

## 2023-07-21 MED ORDER — FENTANYL CITRATE (PF) 100 MCG/2ML IJ SOLN
25.0000 ug | INTRAMUSCULAR | Status: DC | PRN
  Administered 2023-07-21 (×3): 50 ug via INTRAVENOUS

## 2023-07-21 MED ORDER — HYDROMORPHONE HCL 1 MG/ML IJ SOLN
1.0000 mg | INTRAMUSCULAR | Status: AC | PRN
  Administered 2023-07-21 – 2023-07-22 (×4): 1 mg via INTRAVENOUS
  Filled 2023-07-21 (×5): qty 1

## 2023-07-21 MED ORDER — LIDOCAINE 2% (20 MG/ML) 5 ML SYRINGE
INTRAMUSCULAR | Status: DC | PRN
  Administered 2023-07-21: 50 mg via INTRAVENOUS

## 2023-07-21 SURGICAL SUPPLY — 65 items
ALCOHOL 70% 16 OZ (MISCELLANEOUS) ×2 IMPLANT
BAG COUNTER SPONGE SURGICOUNT (BAG) ×2 IMPLANT
BLADE SURG 10 STRL SS (BLADE) ×2 IMPLANT
BNDG COHESIVE 1X5 TAN STRL LF (GAUZE/BANDAGES/DRESSINGS) IMPLANT
BNDG COHESIVE 4X5 TAN STRL LF (GAUZE/BANDAGES/DRESSINGS) ×2 IMPLANT
BNDG COHESIVE 6X5 TAN ST LF (GAUZE/BANDAGES/DRESSINGS) ×4 IMPLANT
BNDG ELASTIC 3INX 5YD STR LF (GAUZE/BANDAGES/DRESSINGS) IMPLANT
BNDG ELASTIC 6INX 5YD STR LF (GAUZE/BANDAGES/DRESSINGS) IMPLANT
BNDG GAUZE DERMACEA FLUFF 4 (GAUZE/BANDAGES/DRESSINGS) ×6 IMPLANT
BNDG STRETCH GAUZE 3IN X12FT (GAUZE/BANDAGES/DRESSINGS) IMPLANT
CANISTER WOUNDNEG PRESSURE 500 (CANNISTER) IMPLANT
CONNECTOR Y WND VAC (MISCELLANEOUS) IMPLANT
CORD BIPOLAR FORCEPS 12FT (ELECTRODE) IMPLANT
COVER SURGICAL LIGHT HANDLE (MISCELLANEOUS) ×2 IMPLANT
CUFF TOURN SGL QUICK 42 (TOURNIQUET CUFF) IMPLANT
CUFF TRNQT CYL 24X4X16.5-23 (TOURNIQUET CUFF) IMPLANT
CUFF TRNQT CYL 34X4.125X (TOURNIQUET CUFF) ×4 IMPLANT
DRAPE BILATERAL LIMB T (DRAPES) IMPLANT
DRAPE DERMATAC (DRAPES) IMPLANT
DRAPE IMP U-DRAPE 54X76 (DRAPES) IMPLANT
DRAPE INCISE IOBAN 66X45 STRL (DRAPES) ×8 IMPLANT
DRAPE SURG 17X23 STRL (DRAPES) IMPLANT
DRAPE U-SHAPE 47X51 STRL (DRAPES) ×2 IMPLANT
DRSG VAC GRANUFOAM SM (GAUZE/BANDAGES/DRESSINGS) IMPLANT
DURAPREP 26ML APPLICATOR (WOUND CARE) ×2 IMPLANT
ELECT CAUTERY BLADE 6.4 (BLADE) ×2 IMPLANT
ELECT REM PT RETURN 9FT ADLT (ELECTROSURGICAL) ×1 IMPLANT
ELECTRODE REM PT RTRN 9FT ADLT (ELECTROSURGICAL) IMPLANT
FACESHIELD WRAPAROUND (MASK) IMPLANT
FACESHIELD WRAPAROUND OR TEAM (MASK) IMPLANT
GAUZE PAD ABD 8X10 STRL (GAUZE/BANDAGES/DRESSINGS) ×2 IMPLANT
GAUZE SPONGE 4X4 12PLY STRL (GAUZE/BANDAGES/DRESSINGS) ×4 IMPLANT
GAUZE XEROFORM 1X8 LF (GAUZE/BANDAGES/DRESSINGS) ×2 IMPLANT
GAUZE XEROFORM 5X9 LF (GAUZE/BANDAGES/DRESSINGS) ×2 IMPLANT
GLOVE BIO SURGEON STRL SZ7.5 (GLOVE) ×4 IMPLANT
GLOVE BIOGEL PI IND STRL 8 (GLOVE) ×4 IMPLANT
GOWN STRL REUS W/ TWL LRG LVL3 (GOWN DISPOSABLE) ×4 IMPLANT
KIT BASIN OR (CUSTOM PROCEDURE TRAY) ×2 IMPLANT
KIT TURNOVER KIT B (KITS) ×2 IMPLANT
MANIFOLD NEPTUNE II (INSTRUMENTS) ×2 IMPLANT
NS IRRIG 1000ML POUR BTL (IV SOLUTION) ×4 IMPLANT
PACK ORTHO EXTREMITY (CUSTOM PROCEDURE TRAY) ×2 IMPLANT
PAD ARMBOARD POSITIONER FOAM (MISCELLANEOUS) ×4 IMPLANT
PAD NEG PRESSURE SENSATRAC (MISCELLANEOUS) IMPLANT
PADDING CAST ABS COTTON 4X4 ST (CAST SUPPLIES) ×4 IMPLANT
PADDING CAST COTTON 6X4 STRL (CAST SUPPLIES) ×2 IMPLANT
SET CYSTO W/LG BORE CLAMP LF (SET/KITS/TRAYS/PACK) ×2 IMPLANT
SET HNDPC FAN SPRY TIP SCT (DISPOSABLE) IMPLANT
SPONGE T-LAP 18X18 ~~LOC~~+RFID (SPONGE) ×4 IMPLANT
STOCKINETTE IMPERVIOUS 9X36 MD (GAUZE/BANDAGES/DRESSINGS) ×2 IMPLANT
SUT ETHILON 2 0 FS 18 (SUTURE) IMPLANT
SUT ETHILON 2 0 PSLX (SUTURE) IMPLANT
SUT ETHILON 3 0 PS 1 (SUTURE) IMPLANT
SUT PDS AB 2-0 CT1 27 (SUTURE) IMPLANT
SUT VIC AB 2-0 CT1 36 (SUTURE) IMPLANT
SUT VIC AB 2-0 FS1 27 (SUTURE) IMPLANT
SWAB CULTURE ESWAB REG 1ML (MISCELLANEOUS) IMPLANT
SYR CONTROL 10ML LL (SYRINGE) IMPLANT
TOWEL GREEN STERILE (TOWEL DISPOSABLE) ×2 IMPLANT
TOWEL GREEN STERILE FF (TOWEL DISPOSABLE) ×2 IMPLANT
TUBE CONNECTING 12X1/4 (SUCTIONS) ×2 IMPLANT
TUBE NG 5FR 35IN ENFIT (TUBING) IMPLANT
UNDERPAD 30X36 HEAVY ABSORB (UNDERPADS AND DIAPERS) ×4 IMPLANT
WATER STERILE IRR 1000ML POUR (IV SOLUTION) ×2 IMPLANT
YANKAUER SUCT BULB TIP NO VENT (SUCTIONS) ×2 IMPLANT

## 2023-07-21 NOTE — Plan of Care (Signed)

## 2023-07-21 NOTE — Progress Notes (Signed)
 PT Cancellation Note  Patient Details Name: Peggy House MRN: [id number] DOB: 1960-12-14   Cancelled Treatment:    Reason Eval/Treat Not Completed: Medical issues which prohibited therapy (Pt still in OR for I &D. Will check back later date.)   [name] 07/21/2023, 12:00 PM [name] M,PT Acute Rehab Services [phone]

## 2023-07-21 NOTE — Transfer of Care (Signed)
 Immediate Anesthesia Transfer of Care Note  Patient: Peggy House  Procedure(s) Performed: IRRIGATION AND DEBRIDEMENT WOUND (Right: Ankle)  Patient Location: PACU  Anesthesia Type:General  Level of Consciousness: awake, alert , oriented, and patient cooperative  Airway & Oxygen Therapy: Patient Spontanous Breathing and Patient connected to face mask oxygen  Post-op Assessment: Report given to RN and Post -op Vital signs reviewed and stable  Post vital signs: Reviewed and stable  Last Vitals:  Vitals Value Taken Time  BP 173/98 07/21/23 1234  Temp    Pulse 78 07/21/23 1236  Resp 14 07/21/23 1236  SpO2 99 % 07/21/23 1236  Vitals shown include unfiled device data.  Last Pain:  Vitals:   07/21/23 1035  TempSrc: Skin  PainSc: 9       Patients Stated Pain Goal: 0 (07/21/23 1035)  Complications: No notable events documented.

## 2023-07-21 NOTE — Progress Notes (Signed)
 63 year old female with right ankle wound dehiscence and infection following ORIF on 04/24/2023.   She is now s/p I&D and removal of hardware and wound vac placement on 07/15/2023. Cultures from that time growing MSSA and being treated with Cefazolin.  Plan for repeat I&D with possible wound closure today.   Risks and benefits of surgery were discussed with patient and all questions answered. These risks include worsening infection, bleeding, damage to neurovascular structures, fracture, prolonged rehabilitation, DVT, need for assistive device. It is also explained to her that giving her chronic medical conditions and poor wound healing she is a high risk for further complications that may lead to amputation or even death. She understands these risks and wished to proceed with surgery

## 2023-07-21 NOTE — Progress Notes (Signed)
                  Subjective:   Summary: 63 yo F with PMH of HHT, chronic anemia, HTN, T2DM, recent R ankle fracture with ORIF, presenting with wound dehiscence and admitted for MSSA infection of R ankle  Patient was in the OR during morning rounds, seen in the PM. Endorsing significant ankle pain. Discussed therapy after discharge.   Objective:  Vital signs in last 24 hours: Vitals:   07/21/23 1235 07/21/23 1237 07/21/23 1245 07/21/23 1300  BP: (!) 173/98  (!) 171/80 138/82  Pulse: 84 78 77 73  Resp: 14 14 14 11   Temp: 98.4 F (36.9 C)     TempSrc:      SpO2: 98% 99% 96% 96%  Weight:      Height:       Supplemental O2: Nasal Cannula SpO2: 96 % O2 Flow Rate (L/min): 2 L/min  Physical Exam:  Constitutional: well-appearing, in some distress, grimacing. Clean bandages around R ankle  Assessment/Plan:   Peri-implant MSSA infection of R ankle S/p I&D with orthopedic surgery on 4/1. Patient has been afebrile with no significant leukocytosis, VSS. Wound cultures positive for MSSA - NPO since midnight for second I&D today with possible wound closure - Continue cefazoline 2g IV q8h, abx day 9 - Pain control with tylenol 650mg  q6h, gabapentin 100mg  at bedtime, dilaudid 1mg  IV q3h prn, oxycodone 10mg  q3h prn - bowel regimen with miralax BID, senna-docusate BID  Acute on chronic anemia Hereditary hemorrhagic telangiectasias Hemoglobin stable, no signs of acute bleeding - Trend CBC - Continue amicar 1000mg  q12h  Chronic stable conditions HTN: continue home amlodipine 10mg , lisinopril 20mg  GERD: continue home pantoprazole 40mg   Depression: continue home citalopram 20mg , hydroxyzine 25mg  TID prn Insomnia: continue home trazodone 50mg  at bedtime prn  Diet: NPO for procedure today VTE: SCD Code: Full  Dispo: Anticipated discharge to Home pending medical stability and PT evaluation  Monna Fam, MD PGY-1 Internal Medicine Resident Pager Number [phone] Please contact the on  call pager after 5 pm and on weekends at [phone].

## 2023-07-21 NOTE — Anesthesia Preprocedure Evaluation (Signed)
 Anesthesia Evaluation  Patient identified by MRN, date of birth, ID band Patient awake    Reviewed: Allergy & Precautions, H&P , NPO status , Patient's Chart, lab work & pertinent test results  Airway Mallampati: II   Neck ROM: full    Dental   Pulmonary Current Smoker and Patient abstained from smoking.   breath sounds clear to auscultation       Cardiovascular hypertension,  Rhythm:regular Rate:Normal     Neuro/Psych  PSYCHIATRIC DISORDERS Anxiety Depression       GI/Hepatic ,GERD  ,,  Endo/Other  diabetes, Type 2    Renal/GU      Musculoskeletal  (+) Arthritis ,    Abdominal   Peds  Hematology   Anesthesia Other Findings   Reproductive/Obstetrics                             Anesthesia Physical Anesthesia Plan  ASA: 3  Anesthesia Plan: General   Post-op Pain Management:    Induction: Intravenous  PONV Risk Score and Plan: 2 and Ondansetron, Dexamethasone, Midazolam and Treatment may vary due to age or medical condition  Airway Management Planned: LMA  Additional Equipment:   Intra-op Plan:   Post-operative Plan: Extubation in OR  Informed Consent: I have reviewed the patients History and Physical, chart, labs and discussed the procedure including the risks, benefits and alternatives for the proposed anesthesia with the patient or authorized representative who has indicated his/her understanding and acceptance.     Dental advisory given  Plan Discussed with: CRNA, Anesthesiologist and Surgeon  Anesthesia Plan Comments:        Anesthesia Quick Evaluation

## 2023-07-21 NOTE — Progress Notes (Signed)
 PT Cancellation Note  Patient Details Name: YOLA PARADISO MRN: [id number] DOB: 11-20-1960   Cancelled Treatment:    Reason Eval/Treat Not Completed: Medical issues which prohibited therapy (Pt in for I&D. Will return as able.)   [name] 07/21/2023, 10:53 AM Francina Beery M,PT Acute Rehab Services [phone]

## 2023-07-21 NOTE — Op Note (Signed)
.  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 Distal lateral wound was not able to be closed primarilty. Medial wound had are of necrotic skin that was debrided and primary closure attempted however there was significant tension on the wound.  Post-operative plan: The patient will be NWB RLE.  The patient will be kept on Cefazolin for MSSA from previous wound cultures. She may require repeat irrigation and debridement and wound closure if possible. Plastic surgery will be consulted to assist in wound coverage if possible.  DVT prophylaxis being held due history of HHT and bleeding.  Pain control with PRN pain medication preferring oral medicines.   Post-Op Diagnosis: Same Surgeons:Primary: Luci Bank, MD Assistants: None Location: [facility] OR ROOM 05 Anesthesia: General Antibiotics: Ancef 2 g Tourniquet time: N/A Estimated Blood Loss: 50 cc Complications: None Specimens: None Implants: * No implants in log *  Indications for Surgery:   Peggy House is a 63 y.o. female with a right ankle infection and osteomyelitis s/p right ankle open reduction internal fixation on 04/24/2023 with subsequent irrigation and debridement with removal of hardware on 07/15/2023. Due to the significant amount of pus encountered at initial debridement and inability to close her lateral wound she was indicated for a repeat irrigation and debridement with possible wound closure.  Risks and benefits of surgery were discussed with patient and all questions answered. These risks include worsening infection, bleeding, damage to neurovascular structures, fracture,  prolonged rehabilitation, DVT, need for assistive device. It is also explained to her that giving her chronic medical conditions and poor wound healing she is a high risk for further complications that may lead to amputation or even death. She understands these risks and wished to proceed with surgery    Procedure:   The patient was identified properly. Informed consent was obtained and the surgical site was marked. The patient was taken up to suite where general anesthesia was induced.  The patient was positioned supine.  The right leg was prepped and draped in the usual sterile fashion with betadine.  Timeout was performed before the beginning of the case.   The sutures were removed from the medial and lateral wounds. Both wounds were debrided of any devitalized skin, soft tissue and bone using a scalpel, rongeur and curettes. The wounds were irrigated with 3L normal saline.  The medial wound was closed using subcutaneous 2-0 PDS sutures and the skin closed with 2-0 nylon sutures. There was noted to be fairly significant tension on the wound. The proximal aspect of the lateral wound was closed with subcutaneous 2-0 PDS sutures and the skin closed with 2-0 nylon. The distal aspect could not be primarily closed and a wound approximately 7 cm x 4 cm remained. A wound vac was applied to both the medial and lateral wounds with good seal noted. A sterile dressing was applied. The patient was awoken taken to PACU in stable condition.

## 2023-07-21 NOTE — Anesthesia Procedure Notes (Signed)
 Procedure Name: LMA Insertion Date/Time: 07/21/2023 11:32 AM  Performed by: Randon Goldsmith, CRNAPre-anesthesia Checklist: Patient identified, Emergency Drugs available, Suction available and Patient being monitored Patient Re-evaluated:Patient Re-evaluated prior to induction Oxygen Delivery Method: Circle system utilized Preoxygenation: Pre-oxygenation with 100% oxygen Induction Type: IV induction Ventilation: Mask ventilation without difficulty LMA: LMA inserted LMA Size: 4.0 Placement Confirmation: positive ETCO2 and breath sounds checked- equal and bilateral Tube secured with: Tape Dental Injury: Teeth and Oropharynx as per pre-operative assessment

## 2023-07-22 ENCOUNTER — Other Ambulatory Visit: Payer: Self-pay | Admitting: Student

## 2023-07-22 ENCOUNTER — Encounter (HOSPITAL_COMMUNITY): Payer: Self-pay | Admitting: Orthopedic Surgery

## 2023-07-22 DIAGNOSIS — T84629A Infection and inflammatory reaction due to internal fixation device of unspecified bone of leg, initial encounter: Secondary | ICD-10-CM | POA: Diagnosis not present

## 2023-07-22 DIAGNOSIS — S91001D Unspecified open wound, right ankle, subsequent encounter: Secondary | ICD-10-CM

## 2023-07-22 DIAGNOSIS — T8130XA Disruption of wound, unspecified, initial encounter: Secondary | ICD-10-CM

## 2023-07-22 LAB — BASIC METABOLIC PANEL WITH GFR
Anion gap: 12 (ref 5–15)
BUN: 14 mg/dL (ref 8–23)
CO2: 24 mmol/L (ref 22–32)
Calcium: 8.5 mg/dL — ABNORMAL LOW (ref 8.9–10.3)
Chloride: 101 mmol/L (ref 98–111)
Creatinine, Ser: 1.04 mg/dL — ABNORMAL HIGH (ref 0.44–1.00)
GFR, Estimated: >60 mL/min (ref >60)
Glucose, Bld: 130 mg/dL — ABNORMAL HIGH (ref 70–99)
Potassium: 4.3 mmol/L (ref 3.5–5.1)
Sodium: 137 mmol/L (ref 135–145)

## 2023-07-22 LAB — CBC
HCT: 27.6 % — ABNORMAL LOW (ref 36.0–46.0)
Hemoglobin: 8.1 g/dL — ABNORMAL LOW (ref 12.0–15.0)
MCH: 26.1 pg (ref 26.0–34.0)
MCHC: 29.3 g/dL — ABNORMAL LOW (ref 30.0–36.0)
MCV: 89 fL (ref 80.0–100.0)
Platelets: 466 10*3/uL — ABNORMAL HIGH (ref 150–400)
RBC: 3.1 MIL/uL — ABNORMAL LOW (ref 3.87–5.11)
RDW: 20.8 % — ABNORMAL HIGH (ref 11.5–15.5)
WBC: 18.1 10*3/uL — ABNORMAL HIGH (ref 4.0–10.5)
nRBC: 0 % (ref 0.0–0.2)

## 2023-07-22 LAB — GLUCOSE, CAPILLARY
Glucose-Capillary: 110 mg/dL — ABNORMAL HIGH (ref 70–99)
Glucose-Capillary: 133 mg/dL — ABNORMAL HIGH (ref 70–99)
Glucose-Capillary: 103 mg/dL — ABNORMAL HIGH (ref 70–99)
Glucose-Capillary: 131 mg/dL — ABNORMAL HIGH (ref 70–99)

## 2023-07-22 MED ORDER — ORAL CARE MOUTH RINSE: 15.0000 mL | OROMUCOSAL | Status: DC | PRN

## 2023-07-22 MED ORDER — HYDROMORPHONE HCL 1 MG/ML IJ SOLN
1.0000 mg | INTRAMUSCULAR | Status: AC | PRN
  Administered 2023-07-22: 1 mg via INTRAVENOUS

## 2023-07-22 MED ORDER — ENOXAPARIN SODIUM 40 MG/0.4ML IJ SOSY
40.0000 mg | PREFILLED_SYRINGE | INTRAMUSCULAR | Status: DC
  Administered 2023-07-22 – 2023-07-30 (×8): 40 mg via SUBCUTANEOUS
  Filled 2023-07-22 (×8): qty 0.4

## 2023-07-22 NOTE — H&P (View-Only) (Signed)
 1 Day Post-Op  Subjective: Patient is a 63 year old female who had a right ankle repair back in January who subsequently developed an infection of the area.  Patient subsequently underwent right ankle I&D along with removal of her hardware and wound VAC application with Dr. Hulda Humphrey on 07/15/2023.    She most recently underwent irrigation and debridement of right ankle and wound VAC placement yesterday with Dr. Hulda Humphrey.  Intraoperatively, medial wound was closed with 2-0 PDS and 2-0 nylon suture.  There was noted to be fairly significant tension on the wound.  The proximal aspect of the lateral wound was closed with 2-0 PDS sutures and the skin was closed with 2-0 nylon, but the distal aspect could not be closed primarily leaving a 7 cm x 4 cm wound.  Wound VAC was applied to both of the wounds plastics has been consulted for assistance in managing the wound.  Upon entering the room, patient is sitting upright in her bed in no acute distress.  She reports that she is having a little bit of pain from her surgery yesterday.  Objective: Vital signs in last 24 hours: Temp:  [98.7 F (37.1 C)-99.1 F (37.3 C)] 98.8 F (37.1 C) (04/08 1345) Pulse Rate:  [55-70] 55 (04/08 1345) Resp:  [16-18] 18 (04/08 1345) BP: (123-149)/(62-73) 123/62 (04/08 1345) SpO2:  [94 %-95 %] 95 % (04/08 1345) Last BM Date : 07/20/23  Intake/Output from previous day: 04/07 0701 - 04/08 0700 In: 1390 [P.O.:480; I.V.:10; IV Piggyback:900] Out: 400 [Urine:400] Intake/Output this shift: Total I/O In: 580 [P.O.:480; IV Piggyback:100] Out: -   General appearance: alert, cooperative, and no distress Resp: Unlabored breathing, no respiratory distress Extremities: Dressings are clean dry and intact to the right lower extremity.  These were removed.  Wound vacs are in place and functioning to the lateral and medial aspects of her right ankle.  There is very minimal serosanguineous drainage in the canister.  Lab Results:     Latest  Ref Rng & Units 07/22/2023    2:09 AM 07/21/2023    2:40 AM 07/20/2023    4:22 AM  CBC  WBC 4.0 - 10.5 K/uL 18.1  12.3  11.7   Hemoglobin 12.0 - 15.0 g/dL 8.1  7.8  7.7   Hematocrit 36.0 - 46.0 % 27.6  27.0  25.9   Platelets 150 - 400 K/uL 466  392  342     BMET Recent Labs    07/21/23 0240 07/22/23 0209  NA 139 137  K 3.8 4.3  CL 104 101  CO2 28 24  GLUCOSE 111* 130*  BUN 10 14  CREATININE 0.85 1.04*  CALCIUM 8.2* 8.5*   PT/INR No results for input(s): "LABPROT", "INR" in the last 72 hours. ABG No results for input(s): "PHART", "HCO3" in the last 72 hours.  Invalid input(s): "PCO2", "PO2"  Studies/Results: No results found.  Anti-infectives: Anti-infectives (From admission, onward)    Start     Dose/Rate Route Frequency Ordered Stop   07/17/23 2200  ceFAZolin (ANCEF) IVPB 2g/100 mL premix        2 g 200 mL/hr over 30 Minutes Intravenous Every 8 hours 07/17/23 0951     07/17/23 0000  ceFAZolin (ANCEF) IVPB        2 g Intravenous Every 8 hours 07/17/23 1309 08/25/23 2359   07/14/23 2300  vancomycin (VANCOREADY) IVPB 1500 mg/300 mL  Status:  Discontinued        1,500 mg 150 mL/hr over 120 Minutes Intravenous  Every 24 hours 07/13/23 2221 07/17/23 0951   07/13/23 2145  vancomycin (VANCOREADY) IVPB 2000 mg/400 mL        2,000 mg 200 mL/hr over 120 Minutes Intravenous  Once 07/13/23 2142 07/14/23 0143   07/13/23 2045  cefTRIAXone (ROCEPHIN) 2 g in sodium chloride 0.9 % 100 mL IVPB  Status:  Discontinued        2 g 200 mL/hr over 30 Minutes Intravenous Every 24 hours 07/13/23 2036 07/16/23 1448       Assessment/Plan: s/p Procedure(s): IRRIGATION AND DEBRIDEMENT WOUND Plan: -Plan to go to OR next week for irrigation and debridement of right lateral ankle wound with placement of myriad and wound vac change -Patient may be discharged from our standpoint and procedure may done as outpatient -Discharge and medical management per primary team  -Discussed plan with Dr.  Ulice Bold   LOS: 9 days    Laurena Spies, PA-C 07/22/2023

## 2023-07-22 NOTE — Progress Notes (Signed)
                  Subjective:   Summary: 63 yo F with PMH of HHT, chronic anemia, HTN, T2DM, recent R ankle fracture with ORIF, presenting with wound dehiscence and admitted for MSSA infection of R ankle  Endorses significant discomfort but tolerable pain.  Dilaudid and oxycodone have been effective in controlling acute pain.  She has had at least 1 bowel movement since her procedure.  Discussed therapy and rehab once she recovers further.  Objective:  Vital signs in last 24 hours: Vitals:   07/21/23 1315 07/21/23 1324 07/21/23 2026 07/22/23 0456  BP:  (!) 158/75 130/65 (!) 149/73  Pulse: 73 73 70 (!) 57  Resp: 14 18 18 16   Temp:  98.3 F (36.8 C) 99.1 F (37.3 C) 98.7 F (37.1 C)  TempSrc:  Oral Oral Oral  SpO2: 99% 94% 95%   Weight:      Height:       Supplemental O2: Nasal Cannula SpO2: 95 % O2 Flow Rate (L/min): 2 L/min  Physical Exam:  Constitutional: well-appearing, in some discomfort. Clean bandages around R ankle  Assessment/Plan:   Peri-implant MSSA infection of R ankle S/p I&D with orthopedic surgery on 4/1 and 4/7. The wound was not closed with possibility for another I&D per orthopedic surgery.  Plastic surgery will also be consulted to assist in wound coverage.  - Continue cefazoline 2g IV q8h, abx day 10 - Pain control with tylenol 650mg  q6h, gabapentin 100mg  at bedtime, dilaudid 1mg  IV q3h prn, oxycodone 10mg  q3h prn - non-weight bearing on right lower extremity - bowel regimen with miralax BID, senna-docusate BID - PT following  Acute on chronic anemia Hereditary hemorrhagic telangiectasias Hemoglobin stable, no signs of acute bleeding - Trend CBC - Continue amicar 1000mg  q12h  Chronic stable conditions HTN: continue home amlodipine 10mg , lisinopril 20mg  GERD: continue home pantoprazole 40mg   Depression: continue home citalopram 20mg , hydroxyzine 25mg  TID prn Insomnia: continue home trazodone 50mg  at bedtime prn  Diet: Regular VTE: SCD Code:  Full  Dispo: Anticipated discharge to inpatient rehab vs SNF pending PT evaluation and availability  Monna Fam, MD PGY-1 Internal Medicine Resident Pager Number [phone] Please contact the on call pager after 5 pm and on weekends at [phone].

## 2023-07-22 NOTE — Consult Note (Signed)
 1 Day Post-Op  Subjective: Patient is a 63 year old female who had a right ankle repair back in January who subsequently developed an infection of the area.  Patient subsequently underwent right ankle I&D along with removal of her hardware and wound VAC application with Dr. Hulda Humphrey on 07/15/2023.    She most recently underwent irrigation and debridement of right ankle and wound VAC placement yesterday with Dr. Hulda Humphrey.  Intraoperatively, medial wound was closed with 2-0 PDS and 2-0 nylon suture.  There was noted to be fairly significant tension on the wound.  The proximal aspect of the lateral wound was closed with 2-0 PDS sutures and the skin was closed with 2-0 nylon, but the distal aspect could not be closed primarily leaving a 7 cm x 4 cm wound.  Wound VAC was applied to both of the wounds plastics has been consulted for assistance in managing the wound.  Upon entering the room, patient is sitting upright in her bed in no acute distress.  She reports that she is having a little bit of pain from her surgery yesterday.  Objective: Vital signs in last 24 hours: Temp:  [98.7 F (37.1 C)-99.1 F (37.3 C)] 98.8 F (37.1 C) (04/08 1345) Pulse Rate:  [55-70] 55 (04/08 1345) Resp:  [16-18] 18 (04/08 1345) BP: (123-149)/(62-73) 123/62 (04/08 1345) SpO2:  [94 %-95 %] 95 % (04/08 1345) Last BM Date : 07/20/23  Intake/Output from previous day: 04/07 0701 - 04/08 0700 In: 1390 [P.O.:480; I.V.:10; IV Piggyback:900] Out: 400 [Urine:400] Intake/Output this shift: Total I/O In: 580 [P.O.:480; IV Piggyback:100] Out: -   General appearance: alert, cooperative, and no distress Resp: Unlabored breathing, no respiratory distress Extremities: Dressings are clean dry and intact to the right lower extremity.  These were removed.  Wound vacs are in place and functioning to the lateral and medial aspects of her right ankle.  There is very minimal serosanguineous drainage in the canister.  Lab Results:     Latest  Ref Rng & Units 07/22/2023    2:09 AM 07/21/2023    2:40 AM 07/20/2023    4:22 AM  CBC  WBC 4.0 - 10.5 K/uL 18.1  12.3  11.7   Hemoglobin 12.0 - 15.0 g/dL 8.1  7.8  7.7   Hematocrit 36.0 - 46.0 % 27.6  27.0  25.9   Platelets 150 - 400 K/uL 466  392  342     BMET Recent Labs    07/21/23 0240 07/22/23 0209  NA 139 137  K 3.8 4.3  CL 104 101  CO2 28 24  GLUCOSE 111* 130*  BUN 10 14  CREATININE 0.85 1.04*  CALCIUM 8.2* 8.5*   PT/INR No results for input(s): "LABPROT", "INR" in the last 72 hours. ABG No results for input(s): "PHART", "HCO3" in the last 72 hours.  Invalid input(s): "PCO2", "PO2"  Studies/Results: No results found.  Anti-infectives: Anti-infectives (From admission, onward)    Start     Dose/Rate Route Frequency Ordered Stop   07/17/23 2200  ceFAZolin (ANCEF) IVPB 2g/100 mL premix        2 g 200 mL/hr over 30 Minutes Intravenous Every 8 hours 07/17/23 0951     07/17/23 0000  ceFAZolin (ANCEF) IVPB        2 g Intravenous Every 8 hours 07/17/23 1309 08/25/23 2359   07/14/23 2300  vancomycin (VANCOREADY) IVPB 1500 mg/300 mL  Status:  Discontinued        1,500 mg 150 mL/hr over 120 Minutes Intravenous  Every 24 hours 07/13/23 2221 07/17/23 0951   07/13/23 2145  vancomycin (VANCOREADY) IVPB 2000 mg/400 mL        2,000 mg 200 mL/hr over 120 Minutes Intravenous  Once 07/13/23 2142 07/14/23 0143   07/13/23 2045  cefTRIAXone (ROCEPHIN) 2 g in sodium chloride 0.9 % 100 mL IVPB  Status:  Discontinued        2 g 200 mL/hr over 30 Minutes Intravenous Every 24 hours 07/13/23 2036 07/16/23 1448       Assessment/Plan: s/p Procedure(s): IRRIGATION AND DEBRIDEMENT WOUND Plan: -Plan to go to OR next week for irrigation and debridement of right lateral ankle wound with placement of myriad and wound vac change -Patient may be discharged from our standpoint and procedure may done as outpatient -Discharge and medical management per primary team  -Discussed plan with Dr.  Ulice Bold   LOS: 9 days    Laurena Spies, PA-C 07/22/2023

## 2023-07-22 NOTE — Plan of Care (Signed)

## 2023-07-22 NOTE — Progress Notes (Addendum)
 Physical Therapy Treatment Patient Details Name: Peggy House MRN: [id number] DOB: [birth year] Today's Date: 07/22/2023   History of Present Illness Peggy House is a 63 y.o. female admitted 3/30 with dehiscence of her incisions from right ankle ORIF in Jan.  Pt has been weight bearing at times on right foot and developed drainage and cellulitis.  I&D right ankle with wound VAC placement 4/1.   PMH of HHT, chronic anemia, HTN, T2DM, hx of GI bleed, tobacco use,  Right ankle ORIF on 04/24/23 with SNF rehab stay through mid Feb.    PT Comments  Pt admitted with above diagnosis. Pt was able to transfer with supervision and min intermittent cues.  Pt given HEP for LE exercises and demonstrates understanding and performance of the exercises.  Pt continues to progress well and has met goals.  Goals revised today. Confirmed that pt has needed equipment and pt did inquire about [facility].  Pt should continue to progress well and will follow acutely.  Pt currently with functional limitations due to the deficits listed below (see PT Problem List). Pt will benefit from acute skilled PT to increase their independence and safety with mobility to allow discharge.       If plan is discharge home, recommend the following: A little help with walking and/or transfers;A little help with bathing/dressing/bathroom;Assistance with cooking/housework;Assist for transportation;Help with stairs or ramp for entrance   Can travel by private vehicle        Equipment Recommendations  None recommended by PT (Confirmed pt has equipment and its in good condition)    Recommendations for Other Services       Precautions / Restrictions Precautions Precautions: Fall Precaution/Restrictions Comments: VAC in place Restrictions Weight Bearing Restrictions Per Provider Order: Yes RLE Weight Bearing Per Provider Order: Non weight bearing     Mobility  Bed Mobility Overal bed mobility: Needs Assistance Bed  Mobility: Rolling, Sidelying to Sit Rolling: Supervision, Used rails Sidelying to sit: Supervision, HOB elevated       General bed mobility comments: Pt performed bed mobility with increased time, elevated HOB, and using handrails. Pt also needed min cues for hand placement for rolling and from sidelying to sit. PT held pt's NWB leg for safety as pt was transitioning from sidelying to EOB.    Transfers Overall transfer level: Needs assistance Equipment used: None Transfers: Bed to chair/wheelchair/BSC            Lateral/Scoot Transfers: Supervision, From elevated surface General transfer comment: Pt was able to lateral scoot from EOB to drop arm recliner chair with supervision using minimal cues. Pt able to maintain NWB right LE without assist and without cues.    Ambulation/Gait               General Gait Details: N/A as pt will need to be wheelchair level.   Stairs             Wheelchair Mobility     Tilt Bed    Modified Rankin (Stroke Patients Only)       Balance Overall balance assessment: Needs assistance Sitting-balance support: Bilateral upper extremity supported, Feet supported Sitting balance-Leahy Scale: Fair Sitting balance - Comments: Supervision to sit EOB with good safety awareness.                                    Communication Communication Communication: No apparent difficulties  Cognition Arousal: Alert  Behavior During Therapy: WFL for tasks assessed/performed   PT - Cognitive impairments: No apparent impairments                         Following commands: Intact      Cueing Cueing Techniques: Verbal cues, Visual cues, Tactile cues  Exercises Other Exercises Other Exercises: Access Code: [email or web address]  URL: [email or web address]  Date: 07/22/2023  Prepared by: Alvis Lemmings    Exercises  - Supine Hip Abduction  - 3 x daily - 7 x weekly - 2 sets - 10 reps - 5 hold  - Seated Long Arc Quad  - 3 x daily  - 7 x weekly - 2 sets - 10 reps - 5 hold  - Supine Gluteal Sets  - 3 x daily - 7 x weekly - 2 sets - 10 reps - 5 hold  - Seated March  - 3 x daily - 7 x weekly - 2 sets - 10 reps - 5 hold  - Supine Quad Set  - 3 x daily - 7 x weekly - 2 sets - 10 reps - 5 hold Other Exercises: Gave pt exercise program handout above.    General Comments General comments (skin integrity, edema, etc.): Pt found supine in bed upon arrival with VAC on.      Pertinent Vitals/Pain Pain Assessment Pain Assessment: Faces Faces Pain Scale: Hurts even more Breathing: normal Negative Vocalization: none Body Language: relaxed Consolability: no need to console Pain Location: Right LE Pain Descriptors / Indicators: Aching, Discomfort, Grimacing, Guarding Pain Intervention(s): Limited activity within patient's tolerance, Monitored during session, Repositioned, Premedicated before session    Home Living                          Prior Function            PT Goals (current goals can now be found in the care plan section) Acute Rehab PT Goals Patient Stated Goal: to go home Progress towards PT goals: Progressing toward goals    Frequency    Min 2X/week      PT Plan      Co-evaluation              AM-PAC PT "6 Clicks" Mobility   Outcome Measure  Help needed turning from your back to your side while in a flat bed without using bedrails?: A Little Help needed moving from lying on your back to sitting on the side of a flat bed without using bedrails?: A Little Help needed moving to and from a bed to a chair (including a wheelchair)?: A Little Help needed standing up from a chair using your arms (e.g., wheelchair or bedside chair)?: Total Help needed to walk in hospital room?: Total Help needed climbing 3-5 steps with a railing? : Total 6 Click Score: 12    End of Session Equipment Utilized During Treatment: Gait belt Activity Tolerance: Patient tolerated treatment well;Patient limited  by pain Patient left: in chair;with call bell/phone within reach;with chair alarm set Nurse Communication: Mobility status PT Visit Diagnosis: Other abnormalities of gait and mobility (R26.89);Muscle weakness (generalized) (M62.81);Pain Pain - Right/Left: Right Pain - part of body: Leg;Ankle and joints of foot     Time: 1610-9604 PT Time Calculation (min) (ACUTE ONLY): 33 min  Charges:    $Therapeutic Exercise: 8-22 mins $Therapeutic Activity: 8-22 mins PT General Charges $$ ACUTE PT VISIT: 1 Visit  [facility] M,PT Acute Rehab Services [phone]    [name] 07/22/2023, 12:03 PM

## 2023-07-22 NOTE — Anesthesia Postprocedure Evaluation (Signed)
 Anesthesia Post Note  Patient: Peggy House  Procedure(s) Performed: IRRIGATION AND DEBRIDEMENT WOUND (Right: Ankle)     Patient location during evaluation: PACU Anesthesia Type: General Level of consciousness: awake and alert Pain management: pain level controlled Vital Signs Assessment: post-procedure vital signs reviewed and stable Respiratory status: spontaneous breathing, nonlabored ventilation, respiratory function stable and patient connected to nasal cannula oxygen Cardiovascular status: blood pressure returned to baseline and stable Postop Assessment: no apparent nausea or vomiting Anesthetic complications: no   No notable events documented.  Last Vitals:  Vitals:   07/21/23 2026 07/22/23 0456  BP: 130/65 (!) 149/73  Pulse: 70 (!) 57  Resp: 18 16  Temp: 37.3 C 37.1 C  SpO2: 95%     Last Pain:  Vitals:   07/22/23 0639  TempSrc:   PainSc: 9                  Tere Mcconaughey S

## 2023-07-22 NOTE — Progress Notes (Signed)
 Surgery scheduling orders

## 2023-07-22 NOTE — Progress Notes (Signed)
.  Subjective: 1 Day Post-Op Procedure(s) (LRB): IRRIGATION AND DEBRIDEMENT WOUND (Right)  Patient seen this afternoon. Sitting in chair. Moderate pain in ankle. Denies fevers/chills  Activity level:  NWB RLE Diet tolerance:  as tolerated Patient reports pain as moderate.    Objective: Vital signs in last 24 hours: Temp:  [98.7 F (37.1 C)-99.1 F (37.3 C)] 98.8 F (37.1 C) (04/08 1345) Pulse Rate:  [55-70] 55 (04/08 1345) Resp:  [16-18] 18 (04/08 1345) BP: (123-149)/(62-73) 123/62 (04/08 1345) SpO2:  [94 %-95 %] 95 % (04/08 1345)  Labs: Recent Labs    07/20/23 0422 07/21/23 0240 07/22/23 0209  HGB 7.7* 7.8* 8.1*   Recent Labs    07/21/23 0240 07/22/23 0209  WBC 12.3* 18.1*  RBC 3.00* 3.10*  HCT 27.0* 27.6*  PLT 392 466*   Recent Labs    07/21/23 0240 07/22/23 0209  NA 139 137  K 3.8 4.3  CL 104 101  CO2 28 24  BUN 10 14  CREATININE 0.85 1.04*  GLUCOSE 111* 130*  CALCIUM 8.2* 8.5*   No results for input(s): "LABPT", "INR" in the last 72 hours.  Physical Exam Neuro intact, A&Ox3 Breathing comfortably on RA RLE Wound vac in place with good seal Motor intact EHL/FHL Senstaion intact SP/DP/T 2+ DP pulse  Assessment/Plan:  1 Day Post-Op Procedure(s) (LRB): IRRIGATION AND DEBRIDEMENT WOUND (Right) NWB RLE Continue IV abx, now on Cefazolin for MSSA Pain control as needed Trend Hgb and transfuse as needed Trend WBC and CRP. Leukocytosis and CRP improving Wound was unable to be primarily closed. Will consult with plastic surgery for wound care recommendations. Plan to leave Wound Vac for at least 1 week.   Luci Bank 07/22/2023, 4:08 PM

## 2023-07-23 DIAGNOSIS — I780 Hereditary hemorrhagic telangiectasia: Secondary | ICD-10-CM | POA: Diagnosis not present

## 2023-07-23 DIAGNOSIS — T84629A Infection and inflammatory reaction due to internal fixation device of unspecified bone of leg, initial encounter: Secondary | ICD-10-CM | POA: Diagnosis not present

## 2023-07-23 DIAGNOSIS — D649 Anemia, unspecified: Secondary | ICD-10-CM | POA: Diagnosis not present

## 2023-07-23 DIAGNOSIS — B9561 Methicillin susceptible Staphylococcus aureus infection as the cause of diseases classified elsewhere: Secondary | ICD-10-CM

## 2023-07-23 LAB — CBC
HCT: 27.5 % — ABNORMAL LOW (ref 36.0–46.0)
Hemoglobin: 7.9 g/dL — ABNORMAL LOW (ref 12.0–15.0)
MCH: 25.9 pg — ABNORMAL LOW (ref 26.0–34.0)
MCHC: 28.7 g/dL — ABNORMAL LOW (ref 30.0–36.0)
MCV: 90.2 fL (ref 80.0–100.0)
Platelets: 534 10*3/uL — ABNORMAL HIGH (ref 150–400)
RBC: 3.05 MIL/uL — ABNORMAL LOW (ref 3.87–5.11)
RDW: 21.2 % — ABNORMAL HIGH (ref 11.5–15.5)
WBC: 15.5 10*3/uL — ABNORMAL HIGH (ref 4.0–10.5)
nRBC: 0 % (ref 0.0–0.2)

## 2023-07-23 LAB — GLUCOSE, CAPILLARY: Glucose-Capillary: 139 mg/dL — ABNORMAL HIGH (ref 70–99)

## 2023-07-23 NOTE — Evaluation (Addendum)
 Occupational Therapy Evaluation Patient Details Name: Peggy House MRN: [id number] DOB: 03/21/1961 Today's Date: 07/23/2023   History of Present Illness   CORENE House is a 63 y.o. female admitted 3/30 with dehiscence of her incisions from right ankle ORIF in Jan.  Pt has been weight bearing at times on right foot and developed drainage and cellulitis.  I&D right ankle with wound VAC placement 4/1.   PMH of HHT, chronic anemia, HTN, T2DM, hx of GI bleed, tobacco use,  Right ankle ORIF on 04/24/23 with SNF rehab stay through mid Feb.     Clinical Impressions Pt admitted based on above, and was seen based on problem list below. PTA pt was living with family at wheelchair level and receiving min to mod assistance with ADLs. Today pt is requiring set up  to total +2 assist for  ADLs. Bed mobility was CGA, per pt note, standing trial attempted, but unable to come to full stand. Discussed potential AE to assist in LB ADLs. Recommendation of HHOT. OT will continue to follow acutely to maximize functional independence.        If plan is discharge home, recommend the following:   A lot of help with walking and/or transfers;A lot of help with bathing/dressing/bathroom;Assistance with cooking/housework     Functional Status Assessment   Patient has had a recent decline in their functional status and demonstrates the ability to make significant improvements in function in a reasonable and predictable amount of time.     Equipment Recommendations   None recommended by OT (All DME needs met)     Recommendations for Other Services         Precautions/Restrictions   Precautions Precautions: Fall Precaution/Restrictions Comments: Wound vac Restrictions Weight Bearing Restrictions Per Provider Order: Yes RLE Weight Bearing Per Provider Order: Non weight bearing     Mobility Bed Mobility Overal bed mobility: Needs Assistance Bed Mobility: Supine to Sit, Sit to  Supine     Supine to sit: Contact guard, Used rails, HOB elevated Sit to supine: Contact guard assist, HOB elevated, Used rails   General bed mobility comments: Use of bed features    Transfers Overall transfer level: Needs assistance Equipment used: Rolling walker (2 wheels)       General transfer comment: Attempted STS x3 trials unable to come to full stand, would be +2 for stand      Balance Overall balance assessment: Needs assistance Sitting-balance support: Bilateral upper extremity supported, Feet supported Sitting balance-Leahy Scale: Fair     ADL either performed or assessed with clinical judgement   ADL Overall ADL's : Needs assistance/impaired Eating/Feeding: Set up;Sitting   Grooming: Set up;Sitting     Upper Body Dressing : Set up;Sitting   Lower Body Dressing: Total assistance;+2 for physical assistance;Sit to/from stand Lower Body Dressing Details (indicate cue type and reason): Would be +2 for STS, may be able to complete lateral leans       General ADL Comments: Per pt lateral scoots are s for safety, unable to drop recliner arms to complete     Vision Baseline Vision/History: 0 No visual deficits Vision Assessment?: No apparent visual deficits            Pertinent Vitals/Pain Pain Assessment Pain Assessment: Faces Faces Pain Scale: Hurts little more Pain Location: Right LE Pain Descriptors / Indicators: Aching, Discomfort, Grimacing, Guarding Pain Intervention(s): Monitored during session, Premedicated before session, Repositioned     Extremity/Trunk Assessment Upper Extremity Assessment Upper Extremity Assessment: Overall  WFL for tasks assessed;Generalized weakness   Lower Extremity Assessment Lower Extremity Assessment: Defer to PT evaluation   Cervical / Trunk Assessment Cervical / Trunk Assessment: Normal   Communication Communication Communication: No apparent difficulties   Cognition Arousal: Alert Behavior During Therapy:  WFL for tasks assessed/performed         Cueing  General Comments   Cueing Techniques: Verbal cues;Visual cues;Tactile cues  Wound vac intact           Home Living Family/patient expects to be discharged to:: Private residence Living Arrangements: Children Available Help at Discharge: Family;Available PRN/intermittently Type of Home: Apartment Home Access: Level entry     Home Layout: One level     Bathroom Shower/Tub: Sponge bathes at baseline;Tub/shower unit   Bathroom Toilet: Standard Bathroom Accessibility: Yes How Accessible: Accessible via wheelchair Home Equipment: Rolling Walker (2 wheels);BSC/3in1;Shower seat;Wheelchair - manual   Additional Comments: Pt lives with son who works 3pm-3am      Prior Functioning/Environment Prior Level of Function : Needs assist       Mobility Comments: pt left Rehab in Feb and was using wheelchair, pivot transfer NWB Right LE with RW ADLs Comments: Needed assist with bathing and dressing, friend would help pt a few hours day 5 days week    OT Problem List: Decreased strength;Decreased range of motion;Decreased activity tolerance;Impaired balance (sitting and/or standing);Decreased safety awareness;Decreased knowledge of use of DME or AE   OT Treatment/Interventions: Self-care/ADL training;Therapeutic exercise;Energy conservation;DME and/or AE instruction;Therapeutic activities;Patient/family education;Balance training      OT Goals(Current goals can be found in the care plan section)   Acute Rehab OT Goals Patient Stated Goal: To go home OT Goal Formulation: With patient Time For Goal Achievement: 08/06/23 Potential to Achieve Goals: Good   OT Frequency:  Min 2X/week       AM-PAC OT "6 Clicks" Daily Activity     Outcome Measure Help from another person eating meals?: None Help from another person taking care of personal grooming?: A Little Help from another person toileting, which includes using toliet,  bedpan, or urinal?: Total Help from another person bathing (including washing, rinsing, drying)?: A Lot Help from another person to put on and taking off regular upper body clothing?: A Little Help from another person to put on and taking off regular lower body clothing?: Total 6 Click Score: 14   End of Session Equipment Utilized During Treatment: Gait belt;Rolling walker (2 wheels) Nurse Communication: Mobility status  Activity Tolerance: Patient limited by fatigue Patient left: in bed;with call bell/phone within reach;with bed alarm set  OT Visit Diagnosis: Unsteadiness on feet (R26.81);Other abnormalities of gait and mobility (R26.89);Repeated falls (R29.6)                Time: 8413-2440 OT Time Calculation (min): 42 min Charges:  OT General Charges $OT Visit: 1 Visit OT Evaluation $OT Eval Moderate Complexity: 1 Mod OT Treatments $Self Care/Home Management : 23-37 mins  Ivor Messier, OT  Acute Rehabilitation Services Office [phone] Secure chat preferred   Marilynne Drivers 07/23/2023, 11:16 AM

## 2023-07-23 NOTE — Plan of Care (Signed)
  Problem: Clinical Measurements: Goal: Ability to maintain clinical measurements within normal limits will improve Outcome: Progressing Goal: Will remain free from infection Outcome: Progressing   Problem: Nutrition: Goal: Adequate nutrition will be maintained Outcome: Progressing   Problem: Pain Managment: Goal: General experience of comfort will improve and/or be controlled Outcome: Progressing   Problem: Safety: Goal: Ability to remain free from injury will improve Outcome: Progressing   Problem: Skin Integrity: Goal: Risk for impaired skin integrity will decrease Outcome: Progressing

## 2023-07-23 NOTE — Progress Notes (Signed)
                  Subjective:   Summary: 63 yo F with PMH of HHT, chronic anemia, HTN, T2DM, recent R ankle fracture with ORIF, presenting with wound dehiscence and admitted for MSSA infection of R ankle  Patient seen at bedside this morning.  She appears comfortable, not having any significant postprocedure pain.  Endorses regular bowel movements.  Discussed plan for therapy options prior to next procedure including possibility of inpatient rehab.  Objective:  Vital signs in last 24 hours: Vitals:   07/22/23 1345 07/22/23 2018 07/23/23 0444 07/23/23 0850  BP: 123/62 126/61 (!) 156/74 (!) 152/69  Pulse: (!) 55 (!) 54 (!) 58 (!) 58  Resp: 18 18 13 18   Temp: 98.8 F (37.1 C) 98.8 F (37.1 C) 98.8 F (37.1 C) 98.5 F (36.9 C)  TempSrc: Oral     SpO2: 95% 98% 93% 96%  Weight:      Height:       Supplemental O2: Nasal Cannula SpO2: 96 % O2 Flow Rate (L/min): 2 L/min  Physical Exam:  Constitutional: well-appearing, in some discomfort. Clean bandages around R ankle  Assessment/Plan:   Peri-implant MSSA infection of R ankle S/p I&D with orthopedic surgery on 4/1 and 4/7.  Per plastic surgery, plan to go to the OR next week for irrigation debridement of right lateral ankle wound. Surgery team's perspective, patient is stable for discharge until her next procedure, which can be completed outpatient. She stayed at [facility] earlier this year when she initially experienced her ankle fracture and had a very positive experience. I believe she would be a good candidate for another inpatient rehab stay if possible. - Continue cefazoline 2g IV q8h likely until next procedure, abx day 11 - Consult for potential [facility] placement - Pain control with tylenol 650mg  q6h, gabapentin 100mg  at bedtime, dilaudid 1mg  IV q3h prn, oxycodone 10mg  q3h prn - non-weight bearing on right lower extremity - bowel regimen with miralax BID, senna-docusate BID - PT following  Acute on chronic anemia Hereditary  hemorrhagic telangiectasias Hemoglobin stable, no signs of acute bleeding.  Difficult medical decision making to weigh cost/benefit of anticoagulation for DVT prophylaxis while on a procoagulant agent.  Will continue prophylactic Lovenox at this time. - Trend CBC - Continue amicar 1000mg  q12h - Lovenox for DVT ppx  Chronic stable conditions HTN: continue home amlodipine 10mg , lisinopril 20mg  GERD: continue home pantoprazole 40mg   Depression: continue home citalopram 20mg , hydroxyzine 25mg  TID prn Insomnia: continue home trazodone 50mg  at bedtime prn  Diet: Regular VTE: SCD Code: Full  Dispo: Anticipated discharge to inpatient rehab vs SNF pending PT evaluation and availability  Monna Fam, MD PGY-1 Internal Medicine Resident Pager Number [phone] Please contact the on call pager after 5 pm and on weekends at [phone].

## 2023-07-23 NOTE — Progress Notes (Signed)
 Inpatient Rehab Admissions Coordinator:   Consult received and chart reviewed.  Pt admitted to [facility] in January 2025 for intensive therapy following a fall with R ankle fx s/p ORIF.  She discharged at [facility] w/c level for mobility, with decreased caregiver support.  No f/u with ortho since discharge.  She was readmitted on 07/14/23 with worsening ankle pain and ultimate dehiscence of lateral incision site.  In ED, tmax 100.4, WBC 18.5, Hgb 6.1 (history of HHT), and K 3.1.  Imaging showed diffused soft tissue swelling and subcu emphysema of lateral hindfoot, no evidence of osteomyelitis.  She underwent I&D with vac placement x2 with Dr. Hulda Humphrey for management of infection.  She is on IV ancef currently.  She is to return to the OR next week for further I&D and vac change.  Therapy evaluations completed and she was supervision for w/c level mobility, maintaining NWB.  As she is currently requiring the same level of assist as she did at discharge from [facility], we would not pursue admission at this time.  If, following her treatment for the infection in her ankle, she has demonstrated a significant functional decline, we can reassess.  I will sign off.    Estill Dooms, PT, DPT Admissions Coordinator [phone] 07/23/23  2:22 PM

## 2023-07-24 ENCOUNTER — Inpatient Hospital Stay

## 2023-07-24 ENCOUNTER — Inpatient Hospital Stay: Admitting: Hematology

## 2023-07-24 DIAGNOSIS — T84629A Infection and inflammatory reaction due to internal fixation device of unspecified bone of leg, initial encounter: Secondary | ICD-10-CM | POA: Diagnosis not present

## 2023-07-24 NOTE — Progress Notes (Signed)
 Physical Therapy Treatment Patient Details Name: Peggy House MRN: [id number] DOB: November 10, 1960 Today's Date: 07/24/2023   History of Present Illness Peggy House is a 63 y.o. female admitted 3/30 with dehiscence of her incisions from right ankle ORIF in Jan.  Pt has been weight bearing at times on right foot and developed drainage and cellulitis.  I&D right ankle with wound VAC placement 4/1.   Repeat I&D 4/7. PMH of HHT, chronic anemia, HTN, T2DM, hx of GI bleed, tobacco use,  Right ankle ORIF on 04/24/23 with SNF rehab stay through mid Feb.    PT Comments  Pt admitted with above diagnosis. Pt attempt to stand to RW needing +2 mod assist to stand and pivot. Pt is not safe to perform this at home.  Pt is able to transfer to drop arm recliner (simulating wheelchair) with supervision and maintains NWB right LE.  Feel that if pt gets hospital bed and drop arm commode and can line up some caregivers and family, she can go home wheelchair level and pt agrees. Will continue to follow acutely.  Pt currently with functional limitations due to the deficits listed below (see PT Problem List). Pt will benefit from acute skilled PT to increase their independence and safety with mobility to allow discharge.       If plan is discharge home, recommend the following: A little help with walking and/or transfers;A little help with bathing/dressing/bathroom;Assistance with cooking/housework;Assist for transportation;Help with stairs or ramp for entrance   Can travel by private vehicle        Equipment Recommendations  Hospital bed;Other (comment) (Drop arm commode)    Recommendations for Other Services       Precautions / Restrictions Precautions Precautions: Fall Recall of Precautions/Restrictions: Intact Precaution/Restrictions Comments: Wound vac Restrictions Weight Bearing Restrictions Per Provider Order: Yes RLE Weight Bearing Per Provider Order: Non weight bearing     Mobility   Bed Mobility Overal bed mobility: Modified Independent Bed Mobility: Rolling, Sidelying to Sit Rolling: Modified independent (Device/Increase time), Used rails Sidelying to sit: Modified independent (Device/Increase time), HOB elevated       General bed mobility comments: Use of bed features. Discussed with pt the possibility of acquiring a hospital bed.    Transfers Overall transfer level: Needs assistance Equipment used: Rolling walker (2 wheels) Transfers: Sit to/from Stand, Bed to chair/wheelchair/BSC Sit to Stand: Mod assist, +2 physical assistance, From elevated surface          Lateral/Scoot Transfers: Supervision General transfer comment: Pt transfer from EOB to 3N1 using RW. Pt was able to stand with mod assist +2 but unable to keep back straight. Pt pivoted to 3N1 keeping her NWB foot off the ground with cues. Pt was able to stand up from 3N1 with mod assist and stood up for 2 minutes to be cleaned. Pt then pivot back to bed. Pt briefly placed toes on the ground due to fatigue while pivoting back to EOB. Pt then scooted from EOB to chair with no cues and keeping NWB foot off the ground.    Ambulation/Gait               General Gait Details: N/A as pt will need to be wheelchair level.   Stairs             Wheelchair Mobility     Tilt Bed    Modified Rankin (Stroke Patients Only)       Balance Overall balance assessment: Independent Sitting-balance support: Feet supported  Sitting balance-Leahy Scale: Good Sitting balance - Comments: Supervision to sit EOB with good safety awareness.   Standing balance support: Bilateral upper extremity supported, Reliant on assistive device for balance Standing balance-Leahy Scale: Poor Standing balance comment: Use of RW to keep balance as well as external support of 2 persons                            Communication Communication Communication: No apparent difficulties  Cognition Arousal:  Alert Behavior During Therapy: WFL for tasks assessed/performed   PT - Cognitive impairments: No apparent impairments                         Following commands: Intact      Cueing Cueing Techniques: Verbal cues, Visual cues  Exercises General Exercises - Lower Extremity Long Arc Quad: AROM, Both, 5 reps, Seated Hip Flexion/Marching: AROM, Both, 5 reps, Seated    General Comments General comments (skin integrity, edema, etc.): Issued Falls At home checklist to pt.      Pertinent Vitals/Pain Pain Assessment Faces Pain Scale: Hurts a little bit Breathing: normal Negative Vocalization: none Body Language: relaxed Consolability: no need to console Pain Location: Right LE Pain Descriptors / Indicators: Discomfort, Grimacing Pain Intervention(s): Limited activity within patient's tolerance, Monitored during session, Repositioned    Home Living                          Prior Function            PT Goals (current goals can now be found in the care plan section) Acute Rehab PT Goals Patient Stated Goal: to go home Progress towards PT goals: Progressing toward goals    Frequency    Min 2X/week      PT Plan      Co-evaluation              AM-PAC PT "6 Clicks" Mobility   Outcome Measure  Help needed turning from your back to your side while in a flat bed without using bedrails?: A Little Help needed moving from lying on your back to sitting on the side of a flat bed without using bedrails?: A Little Help needed moving to and from a bed to a chair (including a wheelchair)?: A Little Help needed standing up from a chair using your arms (e.g., wheelchair or bedside chair)?: Total Help needed to walk in hospital room?: Total Help needed climbing 3-5 steps with a railing? : Total 6 Click Score: 12    End of Session Equipment Utilized During Treatment: Gait belt Activity Tolerance: Patient tolerated treatment well;Patient limited by  fatigue Patient left: in chair;with call bell/phone within reach;with chair alarm set Nurse Communication: Mobility status PT Visit Diagnosis: Unsteadiness on feet (R26.81);Other abnormalities of gait and mobility (R26.89);Muscle weakness (generalized) (M62.81);Pain Pain - Right/Left: Right Pain - part of body: Leg;Ankle and joints of foot     Time: 1030-1110 PT Time Calculation (min) (ACUTE ONLY): 40 min  Charges:    $Therapeutic Exercise: 8-22 mins $Therapeutic Activity: 23-37 mins PT General Charges $$ ACUTE PT VISIT: 1 Visit                     Yamilette Garretson M,PT Acute Rehab Services [phone]    [name] 07/24/2023, 11:46 AM

## 2023-07-24 NOTE — Plan of Care (Signed)
  Problem: Clinical Measurements: Goal: Ability to maintain clinical measurements within normal limits will improve Outcome: Progressing Goal: Will remain free from infection Outcome: Progressing   Problem: Nutrition: Goal: Adequate nutrition will be maintained Outcome: Progressing   Problem: Elimination: Goal: Will not experience complications related to bowel motility Outcome: Progressing Goal: Will not experience complications related to urinary retention Outcome: Progressing   Problem: Pain Managment: Goal: General experience of comfort will improve and/or be controlled Outcome: Progressing   Problem: Safety: Goal: Ability to remain free from injury will improve Outcome: Progressing   Problem: Skin Integrity: Goal: Risk for impaired skin integrity will decrease Outcome: Progressing

## 2023-07-24 NOTE — TOC Progression Note (Signed)
 Transition of Care [facility]: [facility]) - Progression Note    Patient Details  Name: Peggy House MRN: [id number] Date of Birth: [birth year]  Transition of Care [facility]) CM/SW Contact  Epifanio Lesches, RN Phone Number: 07/24/2023, 4:39 PM  Clinical Narrative:    Referral made with Ferdie Ping for DME hospital bed and drop arm BSC.[name] Pt with wound vac, NCM unable to secure HHRN  to mange wound vac. [facility] will provide home health services for PT,OT , NA. [name] with [facility] following for home IV  ABX therapy.  TOC team following and will continue assist with needs....   Expected Discharge Plan: Home w Home Health Services Barriers to Discharge: Continued Medical Work up  Expected Discharge Plan and Services   Discharge Planning Services: CM Consult   Living arrangements for the past 2 months: Apartment                 DME Arranged: Hospital bed, Bedside commode DME Agency: Other - Comment ([facility]) Date DME Agency Contacted: 07/24/23 Time DME Agency Contacted: [phone] Representative spoke with at DME Agency: Ian Malkin HH Arranged: PT, RN HH Agency: [facility] Date HH Agency Contacted: 07/17/23 Time HH Agency Contacted: [phone] Representative spoke with at [facility] Agency: [name]   Social Determinants of Health (SDOH) Interventions SDOH Screenings   Food Insecurity: No Food Insecurity (07/14/2023)  Housing: Low Risk  (07/14/2023)  Transportation Needs: No Transportation Needs (07/14/2023)  Utilities: Not At Risk (07/14/2023)  Depression (PHQ2-9): High Risk (07/04/2023)  Tobacco Use: High Risk (07/21/2023)    Readmission Risk Interventions    04/18/2023    3:07 PM 04/15/2023    2:22 PM  Readmission Risk Prevention Plan  Transportation Screening Complete Complete  PCP or Specialist Appt within 5-7 Days  Complete  PCP or Specialist Appt within 3-5 Days Complete   Home Care Screening  Complete  Medication Review (RN CM)  Complete  HRI or Home Care  Consult Complete   Social Work Consult for Recovery Care Planning/Counseling Complete   Palliative Care Screening Complete   Medication Review Oceanographer) Complete

## 2023-07-24 NOTE — Progress Notes (Signed)
                  Subjective:   Summary: 63 yo F with PMH of HHT, chronic anemia, HTN, T2DM, recent R ankle fracture with ORIF, presenting with wound dehiscence and admitted for MSSA infection of R ankle  Patient reports pain is well controlled. She is having some distress about discharge planning; she feels anxious about going home without a significant source of support. Assured her we would make a plan for further care after discharge.   Objective:  Vital signs in last 24 hours: Vitals:   07/23/23 0850 07/23/23 1622 07/23/23 2108 07/24/23 0625  BP: (!) 152/69 (!) 120/59 (!) 160/71 (!) 146/65  Pulse: (!) 58 (!) 56 (!) 57 (!) 51  Resp: 18  18 16   Temp: 98.5 F (36.9 C) 98.3 F (36.8 C) 98.3 F (36.8 C) 98.5 F (36.9 C)  TempSrc:  Oral    SpO2: 96% 97% 93% 97%  Weight:      Height:       Supplemental O2: Nasal Cannula SpO2: 97 % O2 Flow Rate (L/min): 2 L/min  Physical Exam:  Constitutional: well-appearing, in some discomfort. Clean bandages around R ankle  Assessment/Plan:   Peri-implant MSSA infection of R ankle S/p I&D with orthopedic surgery on 4/1 and 4/7. Per plastic surgery, plan to go to the OR next week for irrigation debridement of right lateral ankle wound. From the surgery team's perspective, patient is stable for discharge until her next procedure, which can be completed outpatient. CIR coordinator has indicated patient will not be a candidate for inpatient rehab. Per PT recommendations, she may be able to go home with a wheelchair, hospital bed, and bedside commode if she can establish enough support from friends/family.  - Continue cefazoline 2g IV q8h. OPAT established with end date of 08/25/23 - Pain control with oral analgesics: tylenol 650mg  q6h, gabapentin 100mg  at bedtime, oxycodone 10mg  q3h prn - non-weight bearing on right lower extremity - bowel regimen with miralax BID, senna-docusate BID - PT following  Acute on chronic anemia Hereditary  hemorrhagic telangiectasias Hemoglobin stable, no signs of acute bleeding.  Difficult medical decision making to weigh cost/benefit of anticoagulation for DVT prophylaxis while on a procoagulant agent.  Will continue prophylactic Lovenox at this time. - Trend CBC - Continue amicar 1000mg  q12h - Lovenox for DVT ppx  Chronic stable conditions HTN: continue home amlodipine 10mg , lisinopril 20mg  GERD: continue home pantoprazole 40mg   Depression: continue home citalopram 20mg , hydroxyzine 25mg  TID prn Insomnia: continue home trazodone 50mg  at bedtime prn  Diet: Regular VTE: Lovenox Code: Full  Dispo: Anticipated discharge to SNF vs home pending authorization and bed availability  Monna Fam, MD PGY-1 Internal Medicine Resident Pager Number [phone] Please contact the on call pager after 5 pm and on weekends at [phone].

## 2023-07-25 DIAGNOSIS — T84629A Infection and inflammatory reaction due to internal fixation device of unspecified bone of leg, initial encounter: Secondary | ICD-10-CM | POA: Diagnosis not present

## 2023-07-25 NOTE — Progress Notes (Signed)
 Occupational Therapy Treatment Patient Details Name: Peggy House MRN: [id number] DOB: 1960-10-28 Today's Date: 07/25/2023   History of present illness Peggy House is a 63 y.o. female admitted 3/30 with dehiscence of her incisions from right ankle ORIF in Jan.  Pt has been weight bearing at times on right foot and developed drainage and cellulitis.  I&D right ankle with wound VAC placement 4/1.   Repeat I&D 4/7. PMH of HHT, chronic anemia, HTN, T2DM, hx of GI bleed, tobacco use,  Right ankle ORIF on 04/24/23 with SNF rehab stay through mid Feb.   OT comments  Pt progressing well towards goals. Pt in bed c/o of pain, and requested to decline OOB activity, but agreeable to bed level education session. Long handled sponge, sock aid, and reacher provided to pt, and pt able to return demo of use of AE. Education provided on compensatory techniques for LB dressing with first in, last out. Pt able to verbalize understanding. Lateral scoot transfer education completed to drop arm bariatric BSC to promote safe transfers. Continue to recommend HHOT to optimize independence levels. OT recommending drop arm bariatric BSC. Pt is confined to one room, and would be able to complete safe toilet transfer upon d/c. Will continue to follow acutely.       If plan is discharge home, recommend the following:  A lot of help with walking and/or transfers;A lot of help with bathing/dressing/bathroom;Assistance with cooking/housework   Equipment Recommendations  Other (comment) (Drop arm bariatric BSC)    Recommendations for Other Services      Precautions / Restrictions Precautions Precautions: Fall Recall of Precautions/Restrictions: Intact Precaution/Restrictions Comments: Wound vac Restrictions Weight Bearing Restrictions Per Provider Order: Yes RLE Weight Bearing Per Provider Order: Non weight bearing       Mobility Bed Mobility Overal bed mobility: Modified Independent        General bed mobility comments: Pt declining OOB activities d/t pain    Transfers Overall transfer level: Needs assistance         General transfer comment: pt declining OOB activity d/t pain     Balance Overall balance assessment: Independent       ADL either performed or assessed with clinical judgement   ADL Overall ADL's : Needs assistance/impaired       Upper Body Dressing : Set up;Bed level   Lower Body Dressing: Sitting/lateral leans;Set up Lower Body Dressing Details (indicate cue type and reason): Pt able to don socks with use of sock aid, discussed use of reacher and lateral leans or hip bridges to don pants       General ADL Comments: AE education completed bed level    Extremity/Trunk Assessment Upper Extremity Assessment Upper Extremity Assessment: Overall WFL for tasks assessed   Lower Extremity Assessment Lower Extremity Assessment: Defer to PT evaluation        Vision   Vision Assessment?: No apparent visual deficits         Communication Communication Communication: No apparent difficulties   Cognition Arousal: Alert Behavior During Therapy: WFL for tasks assessed/performed Cognition: No apparent impairments       Following commands: Intact        Cueing   Cueing Techniques: Verbal cues, Visual cues        General Comments RLE Wound vac leak alarm, RN notified    Pertinent Vitals/ Pain       Pain Assessment Pain Assessment: Faces Faces Pain Scale: Hurts whole lot Pain Location: Right LE Pain Descriptors /  Indicators: Discomfort, Grimacing Pain Intervention(s): Limited activity within patient's tolerance, Patient requesting pain meds-RN notified   Frequency  Min 2X/week        Progress Toward Goals  OT Goals(current goals can now be found in the care plan section)     Acute Rehab OT Goals Patient Stated Goal: To go home OT Goal Formulation: With patient Time For Goal Achievement: 08/06/23 Potential to Achieve  Goals: Good ADL Goals Pt Will Perform Lower Body Dressing: sitting/lateral leans;with mod assist;with adaptive equipment Pt Will Transfer to Toilet: with supervision;with transfer board Pt Will Perform Toileting - Clothing Manipulation and hygiene: sitting/lateral leans;with supervision  Plan         AM-PAC OT "6 Clicks" Daily Activity     Outcome Measure   Help from another person eating meals?: None Help from another person taking care of personal grooming?: A Little Help from another person toileting, which includes using toliet, bedpan, or urinal?: Total Help from another person bathing (including washing, rinsing, drying)?: A Lot Help from another person to put on and taking off regular upper body clothing?: A Little Help from another person to put on and taking off regular lower body clothing?: A Lot 6 Click Score: 15    End of Session Equipment Utilized During Treatment: Other (comment) (sock aid, reacher, long handled sponge)  OT Visit Diagnosis: Unsteadiness on feet (R26.81);Other abnormalities of gait and mobility (R26.89);Repeated falls (R29.6)   Activity Tolerance Patient limited by pain   Patient Left in bed;with call bell/phone within reach;with bed alarm set   Nurse Communication Mobility status;Patient requests pain meds        Time: 1151-1209 OT Time Calculation (min): 18 min  Charges: OT General Charges $OT Visit: 1 Visit OT Treatments $Self Care/Home Management : 8-22 mins  Ivor Messier, OT  Acute Rehabilitation Services Office [phone] Secure chat preferred   Marilynne Drivers 07/25/2023, 12:24 PM

## 2023-07-25 NOTE — Progress Notes (Signed)
                  Subjective:   Summary: 63 yo F with PMH of HHT, chronic anemia, HTN, T2DM, recent R ankle fracture with ORIF, presenting with wound dehiscence and admitted for MSSA infection of R ankle  Patient is feeling well this morning, no new complaints. Pain is well controlled with current regimen. Informed patient of the plan to remain inpatient until procedure with plastic surgery can be performed next week. All questions answered, patient is amenable to the plan.   Objective:  Vital signs in last 24 hours: Vitals:   07/24/23 0625 07/24/23 1500 07/24/23 2106 07/25/23 0450  BP: (!) 146/65 (!) (P) 152/67 123/63 (!) 156/63  Pulse: (!) 51 (P) 61  60  Resp: 16 (P) 18 18 17   Temp: 98.5 F (36.9 C) (P) 98.2 F (36.8 C) 98.8 F (37.1 C) 99.2 F (37.3 C)  TempSrc:  (P) Oral Oral Oral  SpO2: 97% (P) 97% 96% 95%  Weight:      Height:       Supplemental O2: Nasal Cannula SpO2: 95 % O2 Flow Rate (L/min): 2 L/min  Physical Exam:  Constitutional: well-appearing, in some discomfort. Clean bandages around R ankle Skin: warm, well-perfused  Assessment/Plan:   Peri-implant MSSA infection of R ankle S/p I&D with orthopedic surgery on 4/1 and 4/7. Per plastic surgery, plan to go to the OR next week for irrigation and debridement of right lateral ankle wound with placement of myriad and wound vac change. - Continue cefazoline 2g IV q8h. OPAT established with end date of 08/25/23 - Plan for surgery 4/14 with plastic surgery  - DME orders in for hospital bed and drop arm commode - Pain control with oral analgesics: tylenol 650mg  q6h, gabapentin 100mg  at bedtime, oxycodone 10mg  q3h prn - non-weight bearing on right lower extremity - bowel regimen with miralax BID, senna-docusate BID - PT following  Acute on chronic anemia Hereditary hemorrhagic telangiectasias Hemoglobin stable, no signs of acute bleeding.  - Continue amicar 1000mg  q12h - Lovenox for DVT ppx  Chronic stable  conditions HTN: continue home amlodipine 10mg , lisinopril 20mg  GERD: continue home pantoprazole 40mg   Depression: continue home citalopram 20mg , hydroxyzine 25mg  TID prn Insomnia: continue home trazodone 50mg  at bedtime prn  Diet: Regular VTE: Lovenox Code: Full  Dispo: Anticipated discharge to home after procedure 4/14 and upon delivery of DME   Monna Fam, MD PGY-1 Internal Medicine Resident Pager Number [phone] Please contact the on call pager after 5 pm and on weekends at [phone].

## 2023-07-26 DIAGNOSIS — T84629A Infection and inflammatory reaction due to internal fixation device of unspecified bone of leg, initial encounter: Secondary | ICD-10-CM | POA: Diagnosis not present

## 2023-07-26 LAB — GLUCOSE, CAPILLARY
Glucose-Capillary: 104 mg/dL — ABNORMAL HIGH (ref 70–99)
Glucose-Capillary: 90 mg/dL (ref 70–99)
Glucose-Capillary: 135 mg/dL — ABNORMAL HIGH (ref 70–99)
Glucose-Capillary: 121 mg/dL — ABNORMAL HIGH (ref 70–99)

## 2023-07-26 MED ORDER — METHOCARBAMOL 750 MG PO TABS: 1500.0000 mg | ORAL_TABLET | Freq: Four times a day (QID) | ORAL | Status: DC

## 2023-07-26 MED ORDER — METHOCARBAMOL 750 MG PO TABS
1500.0000 mg | ORAL_TABLET | Freq: Four times a day (QID) | ORAL | Status: AC | PRN
  Administered 2023-07-26 – 2023-07-28 (×6): 1500 mg via ORAL
  Filled 2023-07-26 (×8): qty 2

## 2023-07-26 NOTE — Plan of Care (Signed)
   Problem: Pain Managment: Goal: General experience of comfort will improve and/or be controlled Outcome: Progressing   Problem: Skin Integrity: Goal: Risk for impaired skin integrity will decrease Outcome: Progressing

## 2023-07-26 NOTE — Progress Notes (Signed)
                  Subjective:   Summary: 63 yo F with PMH of HHT, chronic anemia, HTN, T2DM, recent R ankle fracture with ORIF, presenting with wound dehiscence and admitted for MSSA infection of R ankle  Patient is doing well overall today but did not get as much sleep last night although sleeping better this morning.  She did describe some probable muscle spasms in her right leg but otherwise has no new or worsening complaints.  Objective:  Vital signs in last 24 hours: Vitals:   07/25/23 0822 07/25/23 1422 07/25/23 2057 07/26/23 0607  BP: (!) 143/63 135/66 (!) 143/65 136/65  Pulse: (!) 50 (!) 54 (!) 57 (!) 53  Resp: 18 18 17 17   Temp: 98.2 F (36.8 C) 98.7 F (37.1 C) 98.4 F (36.9 C) 98.7 F (37.1 C)  TempSrc: Oral   Oral  SpO2: 94% 97% 98% 100%  Weight:      Height:       Supplemental O2: Nasal Cannula SpO2: 100 % O2 Flow Rate (L/min): 2 L/min  Physical Exam:  Constitutional: well-appearing, in some discomfort. Clean bandages around R ankle Skin: warm, well-perfused, no increased temperature or erythema in the right leg above the ankle wrap  Assessment/Plan:   Peri-implant MSSA infection of R ankle S/p I&D with orthopedic surgery on 4/1 and 4/7. Per plastic surgery, plan to go to the OR next week for irrigation and debridement of right lateral ankle wound with placement of myriad and wound vac change. - Continue cefazoline 2g IV q8h. OPAT established with end date of 08/25/23 - Plan for surgery 4/14 with plastic surgery  - DME orders in for hospital bed and drop arm commode - Pain control with oral analgesics: tylenol 650mg  q6h, gabapentin 100mg  at bedtime, oxycodone 10mg  q3h prn, added Robaxin 1500 mg every 6 hours as needed for the next 3 days (if beneficial can start maintenance dose of 750 mg 3 times daily/4 times daily) - non-weight bearing on right lower extremity - bowel regimen with miralax BID, senna-docusate BID - PT following  Acute on chronic  anemia Hereditary hemorrhagic telangiectasias No signs of acute bleeding.  - Continue amicar 1000mg  q12h - Lovenox for DVT ppx  Chronic stable conditions HTN: continue home amlodipine 10mg , lisinopril 20mg  GERD: continue home pantoprazole 40mg   Depression: continue home citalopram 20mg , hydroxyzine 25mg  TID prn Insomnia: continue home trazodone 50mg  at bedtime prn  Diet: Regular VTE: Lovenox Code: Full  Dispo: Anticipated discharge to home after procedure 4/14 and upon delivery of DME   [name], DO Internal Medicine Resident, PGY-2 Please contact the on call pager at [phone] for any urgent or emergent needs. 6:26 AM 07/26/2023

## 2023-07-27 ENCOUNTER — Encounter: Payer: Self-pay | Admitting: Internal Medicine

## 2023-07-27 DIAGNOSIS — T84629A Infection and inflammatory reaction due to internal fixation device of unspecified bone of leg, initial encounter: Secondary | ICD-10-CM | POA: Diagnosis not present

## 2023-07-27 DIAGNOSIS — R011 Cardiac murmur, unspecified: Secondary | ICD-10-CM | POA: Insufficient documentation

## 2023-07-27 HISTORY — DX: Cardiac murmur, unspecified: R01.1

## 2023-07-27 LAB — GLUCOSE, CAPILLARY: Glucose-Capillary: 129 mg/dL — ABNORMAL HIGH (ref 70–99)

## 2023-07-27 MED ORDER — LORATADINE 10 MG PO TABS
10.0000 mg | ORAL_TABLET | Freq: Every day | ORAL | Status: DC
  Administered 2023-07-27 – 2023-07-31 (×5): 10 mg via ORAL
  Filled 2023-07-27 (×5): qty 1

## 2023-07-27 MED ORDER — FLUTICASONE PROPIONATE 50 MCG/ACT NA SUSP
1.0000 | Freq: Every day | NASAL | Status: DC
  Administered 2023-07-27 – 2023-07-31 (×5): 1 via NASAL
  Filled 2023-07-27: qty 16

## 2023-07-27 NOTE — Plan of Care (Addendum)
  Problem: Education: Goal: Knowledge of General Education information will improve Description: Including pain rating scale, medication(s)/side effects and non-pharmacologic comfort measures Outcome: Progressing   Problem: Pain Managment: Goal: General experience of comfort will improve and/or be controlled Outcome: Progressing

## 2023-07-27 NOTE — Plan of Care (Signed)

## 2023-07-27 NOTE — Progress Notes (Signed)
                  Subjective:   Summary: 63 yo F with PMH of HHT, chronic anemia, HTN, T2DM, recent R ankle fracture with ORIF, presenting with wound dehiscence and admitted for MSSA infection of R ankle  She still has some muscle spasms but the robaxin helped some. She is having some cough and congestion that is most consistent with previous allergies and used to take medicine for this.  Objective:  Vital signs in last 24 hours: Vitals:   07/26/23 0840 07/26/23 1640 07/26/23 2118 07/27/23 0434  BP: (!) 146/66 (!) 125/59 134/65 (!) 100/51  Pulse: (!) 54 (!) 58  (!) 57  Resp: 15 17 18 18   Temp: 98.4 F (36.9 C) 98.7 F (37.1 C) 99.4 F (37.4 C) 98.9 F (37.2 C)  TempSrc: Oral Oral Oral   SpO2: 95% 99% 99% 100%  Weight:      Height:       Supplemental O2: Nasal Cannula SpO2: 100 % O2 Flow Rate (L/min): 2 L/min  Physical Exam:  Constitutional: well-appearing, in some discomfort. Clean bandages around R ankle Lungs:mildly decreased sounds in the bases bilaterally, normal work of breathing Cardiac: systolic murmur over LUSB Skin: warm, well-perfused, no increased temperature or erythema in the right leg above the ankle wrap  Assessment/Plan:   Peri-implant MSSA infection of R ankle S/p I&D with orthopedic surgery on 4/1 and 4/7. Per plastic surgery, plan to go to the OR next week for irrigation and debridement of right lateral ankle wound with placement of myriad and wound vac change. - Continue cefazoline 2g IV q8h. OPAT established with end date of 08/25/23 - Plan for surgery 4/14 at 3:15 with plastic surgery  - DME orders in for hospital bed and drop arm commode - Pain control with oral analgesics: tylenol 650mg  q6h, gabapentin 100mg  at bedtime, oxycodone 10mg  q3h prn, added Robaxin 1500 mg every 6 hours as needed for the next 3 days (if beneficial can start maintenance dose of 750 mg 3 times daily/4 times daily) - non-weight bearing on right lower extremity - bowel regimen  with miralax BID, senna-docusate BID - PT following  Acute on chronic anemia Hereditary hemorrhagic telangiectasias No signs of acute bleeding.  - Continue amicar 1000mg  q12h - Lovenox for DVT ppx  Chronic stable conditions HTN: continue home amlodipine 10mg , lisinopril 20mg  GERD: continue home pantoprazole 40mg   Depression: continue home citalopram 20mg , hydroxyzine 25mg  TID prn Insomnia: continue home trazodone 50mg  at bedtime prn  Diet: Regular VTE: Lovenox Code: Full  Dispo: Anticipated discharge to home after procedure 4/14 and upon delivery of DME   [name], DO Internal Medicine Resident, PGY-2 Please contact the on call pager at [phone] for any urgent or emergent needs. 6:30 AM 07/27/2023

## 2023-07-28 ENCOUNTER — Encounter (HOSPITAL_COMMUNITY): Payer: Self-pay | Admitting: Infectious Diseases

## 2023-07-28 ENCOUNTER — Encounter (HOSPITAL_COMMUNITY)
Admission: EM | Disposition: A | Discharge status: Home Health | Payer: Self-pay | Source: Home / Self Care | Attending: Internal Medicine

## 2023-07-28 ENCOUNTER — Inpatient Hospital Stay (HOSPITAL_COMMUNITY): Payer: Self-pay

## 2023-07-28 ENCOUNTER — Other Ambulatory Visit: Payer: Self-pay

## 2023-07-28 DIAGNOSIS — E119 Type 2 diabetes mellitus without complications: Secondary | ICD-10-CM | POA: Diagnosis not present

## 2023-07-28 DIAGNOSIS — I10 Essential (primary) hypertension: Secondary | ICD-10-CM | POA: Diagnosis not present

## 2023-07-28 DIAGNOSIS — T84622A Infection and inflammatory reaction due to internal fixation device of right tibia, initial encounter: Secondary | ICD-10-CM | POA: Diagnosis not present

## 2023-07-28 DIAGNOSIS — M86271 Subacute osteomyelitis, right ankle and foot: Secondary | ICD-10-CM | POA: Diagnosis not present

## 2023-07-28 DIAGNOSIS — I780 Hereditary hemorrhagic telangiectasia: Secondary | ICD-10-CM | POA: Diagnosis not present

## 2023-07-28 DIAGNOSIS — B9561 Methicillin susceptible Staphylococcus aureus infection as the cause of diseases classified elsewhere: Secondary | ICD-10-CM | POA: Diagnosis not present

## 2023-07-28 DIAGNOSIS — D62 Acute posthemorrhagic anemia: Secondary | ICD-10-CM | POA: Diagnosis not present

## 2023-07-28 DIAGNOSIS — F17210 Nicotine dependence, cigarettes, uncomplicated: Secondary | ICD-10-CM | POA: Diagnosis not present

## 2023-07-28 DIAGNOSIS — T84624A Infection and inflammatory reaction due to internal fixation device of right fibula, initial encounter: Secondary | ICD-10-CM | POA: Diagnosis not present

## 2023-07-28 DIAGNOSIS — E1152 Type 2 diabetes mellitus with diabetic peripheral angiopathy with gangrene: Secondary | ICD-10-CM | POA: Diagnosis not present

## 2023-07-28 DIAGNOSIS — D649 Anemia, unspecified: Secondary | ICD-10-CM | POA: Diagnosis not present

## 2023-07-28 DIAGNOSIS — T8130XA Disruption of wound, unspecified, initial encounter: Secondary | ICD-10-CM | POA: Diagnosis not present

## 2023-07-28 DIAGNOSIS — L03115 Cellulitis of right lower limb: Secondary | ICD-10-CM | POA: Diagnosis not present

## 2023-07-28 DIAGNOSIS — S91001A Unspecified open wound, right ankle, initial encounter: Secondary | ICD-10-CM

## 2023-07-28 DIAGNOSIS — T8131XA Disruption of external operation (surgical) wound, not elsewhere classified, initial encounter: Secondary | ICD-10-CM | POA: Diagnosis not present

## 2023-07-28 DIAGNOSIS — T84629A Infection and inflammatory reaction due to internal fixation device of unspecified bone of leg, initial encounter: Secondary | ICD-10-CM | POA: Diagnosis not present

## 2023-07-28 DIAGNOSIS — F32A Depression, unspecified: Secondary | ICD-10-CM | POA: Diagnosis not present

## 2023-07-28 DIAGNOSIS — E1169 Type 2 diabetes mellitus with other specified complication: Secondary | ICD-10-CM | POA: Diagnosis not present

## 2023-07-28 HISTORY — PX: APPLICATION OF WOUND VAC: SHX5189

## 2023-07-28 HISTORY — PX: APPLICATION, SKIN SUBSTITUTE: SHX7530

## 2023-07-28 HISTORY — PX: INCISION AND DRAINAGE OF WOUND: SHX1803

## 2023-07-28 LAB — GLUCOSE, CAPILLARY
Glucose-Capillary: 102 mg/dL — ABNORMAL HIGH (ref 70–99)
Glucose-Capillary: 94 mg/dL (ref 70–99)
Glucose-Capillary: 206 mg/dL — ABNORMAL HIGH (ref 70–99)

## 2023-07-28 LAB — BASIC METABOLIC PANEL WITH GFR
Anion gap: 7 (ref 5–15)
BUN: 12 mg/dL (ref 8–23)
CO2: 25 mmol/L (ref 22–32)
Calcium: 8.3 mg/dL — ABNORMAL LOW (ref 8.9–10.3)
Chloride: 108 mmol/L (ref 98–111)
Creatinine, Ser: 0.85 mg/dL (ref 0.44–1.00)
GFR, Estimated: >60 mL/min (ref >60)
Glucose, Bld: 113 mg/dL — ABNORMAL HIGH (ref 70–99)
Potassium: 3.8 mmol/L (ref 3.5–5.1)
Sodium: 140 mmol/L (ref 135–145)

## 2023-07-28 LAB — CBC
HCT: 26.4 % — ABNORMAL LOW (ref 36.0–46.0)
Hemoglobin: 7.7 g/dL — ABNORMAL LOW (ref 12.0–15.0)
MCH: 25.8 pg — ABNORMAL LOW (ref 26.0–34.0)
MCHC: 29.2 g/dL — ABNORMAL LOW (ref 30.0–36.0)
MCV: 88.6 fL (ref 80.0–100.0)
Platelets: 443 10*3/uL — ABNORMAL HIGH (ref 150–400)
RBC: 2.98 MIL/uL — ABNORMAL LOW (ref 3.87–5.11)
RDW: 21 % — ABNORMAL HIGH (ref 11.5–15.5)
WBC: 8.4 10*3/uL (ref 4.0–10.5)
nRBC: 0 % (ref 0.0–0.2)

## 2023-07-28 LAB — TYPE AND SCREEN
ABO/RH(D): A NEG
Antibody Screen: NEGATIVE

## 2023-07-28 SURGERY — IRRIGATION AND DEBRIDEMENT WOUND
Anesthesia: General | Site: Ankle | Laterality: Right

## 2023-07-28 MED ORDER — DEXAMETHASONE SODIUM PHOSPHATE 10 MG/ML IJ SOLN
INTRAMUSCULAR | Status: AC
  Filled 2023-07-28: qty 1

## 2023-07-28 MED ORDER — OXYCODONE HCL 5 MG/5ML PO SOLN: 5.0000 mg | Freq: Once | ORAL | Status: DC | PRN

## 2023-07-28 MED ORDER — 0.9 % SODIUM CHLORIDE (POUR BTL) OPTIME
TOPICAL | Status: DC | PRN
  Administered 2023-07-28: 1000 mL

## 2023-07-28 MED ORDER — CHLORHEXIDINE GLUCONATE 0.12 % MT SOLN
OROMUCOSAL | Status: AC
  Administered 2023-07-28: 15 mL
  Filled 2023-07-28: qty 15

## 2023-07-28 MED ORDER — FENTANYL CITRATE (PF) 100 MCG/2ML IJ SOLN
25.0000 ug | INTRAMUSCULAR | Status: DC | PRN
  Administered 2023-07-28 (×2): 50 ug via INTRAVENOUS

## 2023-07-28 MED ORDER — ACETAMINOPHEN 10 MG/ML IV SOLN: 1000.0000 mg | Freq: Once | INTRAVENOUS | Status: DC | PRN

## 2023-07-28 MED ORDER — CHLORHEXIDINE GLUCONATE CLOTH 2 % EX PADS: 6.0000 | MEDICATED_PAD | Freq: Once | CUTANEOUS | Status: DC

## 2023-07-28 MED ORDER — ONDANSETRON HCL 4 MG/2ML IJ SOLN
INTRAMUSCULAR | Status: DC | PRN
  Administered 2023-07-28: 4 mg via INTRAVENOUS

## 2023-07-28 MED ORDER — FENTANYL CITRATE (PF) 100 MCG/2ML IJ SOLN
INTRAMUSCULAR | Status: AC
  Filled 2023-07-28: qty 2

## 2023-07-28 MED ORDER — ONDANSETRON HCL 4 MG/2ML IJ SOLN
INTRAMUSCULAR | Status: AC
  Filled 2023-07-28: qty 2

## 2023-07-28 MED ORDER — PROPOFOL 10 MG/ML IV BOLUS
INTRAVENOUS | Status: AC
  Filled 2023-07-28: qty 20

## 2023-07-28 MED ORDER — MIDAZOLAM HCL 2 MG/2ML IJ SOLN
INTRAMUSCULAR | Status: DC | PRN
  Administered 2023-07-28: 2 mg via INTRAVENOUS

## 2023-07-28 MED ORDER — CEFAZOLIN SODIUM-DEXTROSE 2-4 GM/100ML-% IV SOLN: 2.0000 g | INTRAVENOUS | Status: DC

## 2023-07-28 MED ORDER — PROPOFOL 10 MG/ML IV BOLUS
INTRAVENOUS | Status: DC | PRN
  Administered 2023-07-28: 150 mg via INTRAVENOUS

## 2023-07-28 MED ORDER — FENTANYL CITRATE (PF) 250 MCG/5ML IJ SOLN
INTRAMUSCULAR | Status: AC
Start: 2023-07-28 — End: ?
  Filled 2023-07-28: qty 5

## 2023-07-28 MED ORDER — MIDAZOLAM HCL 2 MG/2ML IJ SOLN
INTRAMUSCULAR | Status: AC
  Filled 2023-07-28: qty 2

## 2023-07-28 MED ORDER — OXYCODONE HCL 5 MG PO TABS: 5.0000 mg | ORAL_TABLET | Freq: Once | ORAL | Status: DC | PRN

## 2023-07-28 MED ORDER — DEXAMETHASONE SODIUM PHOSPHATE 10 MG/ML IJ SOLN
INTRAMUSCULAR | Status: DC | PRN
  Administered 2023-07-28: 10 mg via INTRAVENOUS

## 2023-07-28 MED ORDER — VASHE WOUND IRRIGATION OPTIME
TOPICAL | Status: DC | PRN
  Administered 2023-07-28: 34 [oz_av]

## 2023-07-28 SURGICAL SUPPLY — 46 items
BAG COUNTER SPONGE SURGICOUNT (BAG) ×2 IMPLANT
BENZOIN TINCTURE PRP APPL 2/3 (GAUZE/BANDAGES/DRESSINGS) ×2 IMPLANT
BNDG ELASTIC 4INX 5YD STR LF (GAUZE/BANDAGES/DRESSINGS) IMPLANT
BNDG GAUZE DERMACEA FLUFF 4 (GAUZE/BANDAGES/DRESSINGS) IMPLANT
CANISTER SUCT 3000ML PPV (MISCELLANEOUS) ×2 IMPLANT
CLEANSER WND VASHE INSTL 34OZ (WOUND CARE) IMPLANT
COVER SURGICAL LIGHT HANDLE (MISCELLANEOUS) ×2 IMPLANT
DRAPE HALF SHEET 40X57 (DRAPES) IMPLANT
DRAPE INCISE IOBAN 66X45 STRL (DRAPES) IMPLANT
DRAPE SURG ORHT 6 SPLT 77X108 (DRAPES) IMPLANT
DRESSING MEPILEX FLEX 4X4 (GAUZE/BANDAGES/DRESSINGS) IMPLANT
DRSG ADAPTIC 3X8 NADH LF (GAUZE/BANDAGES/DRESSINGS) IMPLANT
DRSG MEPILEX FLEX 4X4 (GAUZE/BANDAGES/DRESSINGS) ×1 IMPLANT
DRSG VAC GRANUFOAM LG (GAUZE/BANDAGES/DRESSINGS) IMPLANT
DRSG VAC GRANUFOAM MED (GAUZE/BANDAGES/DRESSINGS) IMPLANT
DRSG VAC GRANUFOAM SM (GAUZE/BANDAGES/DRESSINGS) IMPLANT
ELECT CAUTERY BLADE 6.4 (BLADE) IMPLANT
ELECT REM PT RETURN 9FT ADLT (ELECTROSURGICAL) ×1 IMPLANT
ELECTRODE REM PT RTRN 9FT ADLT (ELECTROSURGICAL) ×2 IMPLANT
GAUZE PAD ABD 8X10 STRL (GAUZE/BANDAGES/DRESSINGS) IMPLANT
GAUZE SPONGE 4X4 12PLY STRL (GAUZE/BANDAGES/DRESSINGS) IMPLANT
GFT MATRIX 2 LAYER 5X5 (Graft) ×1 IMPLANT
GLOVE BIO SURGEON STRL SZ 6.5 (GLOVE) ×2 IMPLANT
GLOVE BIOGEL M 6.5 STRL (GLOVE) ×2 IMPLANT
GOWN STRL REUS W/ TWL LRG LVL3 (GOWN DISPOSABLE) ×6 IMPLANT
GRAFT MATRIX 2 LAYER 5X5 (Graft) IMPLANT
KIT BASIN OR (CUSTOM PROCEDURE TRAY) ×2 IMPLANT
KIT TURNOVER KIT B (KITS) ×2 IMPLANT
NDL HYPO 25GX1X1/2 BEV (NEEDLE) ×2 IMPLANT
NEEDLE HYPO 25GX1X1/2 BEV (NEEDLE) ×1 IMPLANT
NS IRRIG 1000ML POUR BTL (IV SOLUTION) ×2 IMPLANT
PACK GENERAL/GYN (CUSTOM PROCEDURE TRAY) ×2 IMPLANT
PAD ARMBOARD POSITIONER FOAM (MISCELLANEOUS) ×4 IMPLANT
POWDER MYRIAD MORCLLS FINE 500 (Miscellaneous) IMPLANT
PWDR MYRIAD MORCELLS FINE 500 (Miscellaneous) ×1 IMPLANT
STAPLER VISISTAT 35W (STAPLE) ×2 IMPLANT
SURGILUBE 2OZ TUBE FLIPTOP (MISCELLANEOUS) IMPLANT
SUT MNCRL AB 3-0 PS2 27 (SUTURE) IMPLANT
SUT MNCRL AB 4-0 PS2 18 (SUTURE) IMPLANT
SUT MON AB 2-0 CT1 36 (SUTURE) IMPLANT
SUT MON AB 5-0 PS2 18 (SUTURE) IMPLANT
SUT VIC AB 5-0 PS2 18 (SUTURE) IMPLANT
SUT VICRYL 3 0 (SUTURE) IMPLANT
SYR CONTROL 10ML LL (SYRINGE) ×2 IMPLANT
TOWEL GREEN STERILE (TOWEL DISPOSABLE) ×2 IMPLANT
UNDERPAD 30X36 HEAVY ABSORB (UNDERPADS AND DIAPERS) ×2 IMPLANT

## 2023-07-28 NOTE — Interval H&P Note (Signed)
 History and Physical Interval Note:  07/28/2023 2:04 PM  Peggy House  has presented today for surgery, with the diagnosis of Wound dehiscence.  The various methods of treatment have been discussed with the patient and family. After consideration of risks, benefits and other options for treatment, the patient has consented to  Procedure(s) with comments: IRRIGATION AND DEBRIDEMENT WOUND (Right) - rrigation and debridement of right ankle wound with placement of myriad and wound vac APPLICATION, SKIN SUBSTITUTE (Right) APPLICATION, WOUND VAC (Right) as a surgical intervention.  The patient's history has been reviewed, patient examined, no change in status, stable for surgery.  I have reviewed the patient's chart and labs.  Questions were answered to the patient's satisfaction.     Lindaann Requena Naitik Hermann

## 2023-07-28 NOTE — Progress Notes (Signed)
 PT Cancellation Note  Patient Details Name: Peggy House MRN: [id number] DOB: Jun 28, 1960   Cancelled Treatment:    Reason Eval/Treat Not Completed: Patient at procedure or test/unavailable. Pt off unit for surgery PT will follow up as time allows.   Rexie Catena 07/28/2023, 3:04 PM

## 2023-07-28 NOTE — Progress Notes (Signed)
    Durable Medical Equipment  (From admission, onward)           Start     Ordered   07/28/23 1130  For home use only DME Hospital bed  Once       Comments: There is an immediate need for a change in position and the beneficiary can operate the controls independently and make the adjustments.  Question Answer Comment  Length of Need 12 Months   Patient has (list medical condition): perioperative hardware infection of R foot s/p hardware removal and I&D   The above medical condition requires: Patient requires the ability to reposition frequently   Head must be elevated greater than: 45 degrees   Bed type Semi-electric   Support Surface: Gel Overlay      07/28/23 1137   07/24/23 1230  For home use only DME Bedside commode  Once       Comments: Drop arm commode  Question:  Patient needs a bedside commode to treat with the following condition  Answer:  Osteomyelitis (HCC)   07/24/23 1230

## 2023-07-28 NOTE — Progress Notes (Signed)
    Durable Medical Equipment  (From admission, onward)           Start     Ordered   07/28/23 1210  For home use only DME Hospital bed  Once       Comments: There is an immediate need for a change in position and the beneficiary can operate the controls independently and make the adjustments.  Question Answer Comment  Length of Need 12 Months   Patient has (list medical condition): perioperative hardware infection of R foot s/p hardware removal and I&D   The above medical condition requires: Patient requires the ability to reposition frequently   Head must be elevated greater than: 45 degrees   Bed type Semi-electric      07/28/23 1210   07/24/23 1230  For home use only DME Bedside commode  Once       Comments: Drop arm commode  Question:  Patient needs a bedside commode to treat with the following condition  Answer:  Osteomyelitis (HCC)   07/24/23 1230

## 2023-07-28 NOTE — Op Note (Signed)
 DATE OF OPERATION: 07/28/2023  LOCATION: [facility]  PREOPERATIVE DIAGNOSIS: Right ankle wound  POSTOPERATIVE DIAGNOSIS: Same  PROCEDURE: Excision of right ankle wound 2 x 7 cm skin and soft tissue with myriad 500 mg and 5 x 5 cm sheet and VAC placement  SURGEON: Gilles Lacks, DO  ASSISTANT: Lamount Pimple, PA  EBL: none  CONDITION: Stable  COMPLICATIONS: None  INDICATION: The patient, Peggy House, is a 63 y.o. female born on 1960-08-30, is here for treatment of a right ankle wound.   PROCEDURE DETAILS:  The patient was seen prior to surgery and marked.  The IV antibiotics were given. The patient was taken to the operating room and given a general anesthetic. A standard time out was performed and all information was confirmed by those in the room. The right leg was prepped and draped.  The wound was irrigated with vashe.  The #10 blade and curette were used to excise the nonviable skin and soft tissue of the 2 x 7 cm lateral ankle wound.  All of the myriad powder and sheet was applied and covered with sorbact.  The KY and VAC was applied.  The heel was cleaned vigorously of nonviable skin.  A small amount of myriad powder was applied to the medial wound.  The patient was allowed to wake up and taken to recovery room in stable condition at the end of the case. The family was notified at the end of the case.   The advanced practice practitioner (APP) assisted throughout the case.  The APP was essential in retraction and counter traction when needed to make the case progress smoothly.  This retraction and assistance made it possible to see the tissue plans for the procedure.  The assistance was needed for blood control, tissue re-approximation and assisted with closure of the incision site.

## 2023-07-28 NOTE — Transfer of Care (Signed)
 Immediate Anesthesia Transfer of Care Note  Patient: Peggy House  Procedure(s) Performed: IRRIGATION AND DEBRIDEMENT WOUND (Right: Ankle) APPLICATION, SKIN SUBSTITUTE (Right: Ankle) APPLICATION, WOUND VAC (Right: Ankle)  Patient Location: PACU  Anesthesia Type:General  Level of Consciousness: awake, alert , and oriented  Airway & Oxygen Therapy: Patient Spontanous Breathing  Post-op Assessment: Report given to RN and Post -op Vital signs reviewed and stable  Post vital signs: Reviewed and stable  Last Vitals:  Vitals Value Taken Time  BP 145/75 07/28/23 1523  Temp    Pulse 69 07/28/23 1525  Resp 18 07/28/23 1525  SpO2 94 % 07/28/23 1525  Vitals shown include unfiled device data.  Last Pain:  Vitals:   07/28/23 1429  TempSrc:   PainSc: 0-No pain      Patients Stated Pain Goal: 0 (07/28/23 0555)  Complications: No notable events documented.

## 2023-07-28 NOTE — Anesthesia Preprocedure Evaluation (Addendum)
 Anesthesia Evaluation  Patient identified by MRN, date of birth, ID band Patient awake    Reviewed: Allergy & Precautions, NPO status , Patient's Chart, lab work & pertinent test results  History of Anesthesia Complications Negative for: history of anesthetic complications  Airway Mallampati: III  TM Distance: >3 FB Neck ROM: Full    Dental  (+) Dental Advisory Given   Pulmonary Current Smoker and Patient abstained from smoking.   breath sounds clear to auscultation       Cardiovascular hypertension, Pt. on medications (-) angina  Rhythm:Regular   1. Left ventricular ejection fraction, by estimation, is 60 to 65%. The  left ventricle has normal function. The left ventricle has no regional  wall motion abnormalities. Left ventricular diastolic parameters are  consistent with Grade II diastolic  dysfunction (pseudonormalization).   2. Right ventricular systolic function is normal. The right ventricular  size is normal.   3. Left atrial size was moderately dilated.   4. The mitral valve is normal in structure. No evidence of mitral valve  regurgitation. No evidence of mitral stenosis.   5. The aortic valve is normal in structure. Aortic valve regurgitation is  trivial. No aortic stenosis is present. Aortic valve mean gradient  measures 12.0 mmHg.   6. The inferior vena cava is normal in size with greater than 50%  respiratory variability, suggesting right atrial pressure of 3 mmHg.      Neuro/Psych    GI/Hepatic Neg liver ROS,GERD  ,,  Endo/Other  diabetes, Oral Hypoglycemic Agents    Renal/GU Lab Results      Component                Value               Date                      NA                       140                 07/28/2023                K                        3.8                 07/28/2023                CO2                      25                  07/28/2023                GLUCOSE                  113 (H)              07/28/2023                BUN                      12                  07/28/2023  CREATININE               0.85                07/28/2023                CALCIUM                   8.3 (L)             07/28/2023                EGFR                     >60                 02/05/2017                GFRNONAA                 >60                 07/28/2023                Musculoskeletal  (+) Arthritis ,    Abdominal   Peds  Hematology  (+) Blood dyscrasia, anemia Lab Results      Component                Value               Date                      WBC                      8.4                 07/28/2023                HGB                      7.7 (L)             07/28/2023                HCT                      26.4 (L)            07/28/2023                MCV                      88.6                07/28/2023                PLT                      443 (H)             07/28/2023              Anesthesia Other Findings   Reproductive/Obstetrics                             Anesthesia Physical Anesthesia Plan  ASA: 3  Anesthesia Plan: General   Post-op Pain Management: Tylenol  PO (pre-op)*   Induction: Intravenous  PONV Risk  Score and Plan: 3 and Ondansetron  and Dexamethasone   Airway Management Planned: LMA and Oral ETT  Additional Equipment: None  Intra-op Plan:   Post-operative Plan: Extubation in OR  Informed Consent: I have reviewed the patients History and Physical, chart, labs and discussed the procedure including the risks, benefits and alternatives for the proposed anesthesia with the patient or authorized representative who has indicated his/her understanding and acceptance.     Dental advisory given  Plan Discussed with: CRNA  Anesthesia Plan Comments:         Anesthesia Quick Evaluation

## 2023-07-28 NOTE — Progress Notes (Signed)
                  Subjective:   Summary: 63 yo F with PMH of HHT, chronic anemia, HTN, T2DM, recent R ankle fracture with ORIF, presenting with wound dehiscence and admitted for MSSA infection of R ankle  Patient is feeling well this morning. Robaxin has been helpful in relieving her muscle spasms. She feels nervous about her surgery today. No new complaints. All questions answered  Objective:  Vital signs in last 24 hours: Vitals:   07/27/23 2005 07/28/23 0523 07/28/23 0838 07/28/23 0845  BP: (!) 141/60 (!) 142/65 (!) 126/57   Pulse: (!) 56 (!) 52 (!) 44 (!) 55  Resp: 17 17 15    Temp: 98.8 F (37.1 C) 98.8 F (37.1 C) 98.5 F (36.9 C)   TempSrc: Oral Oral    SpO2: 99% 100% 96%   Weight:      Height:       Supplemental O2: Nasal Cannula SpO2: 96 % O2 Flow Rate (L/min): 2 L/min  Physical Exam:  Constitutional: well-appearing, in some discomfort. Clean bandages around R ankle Cardiac: systolic murmur over LUSB Skin: warm, well-perfused, no increased temperature or erythema in the right leg above the ankle wrap  Assessment/Plan:   Peri-implant MSSA infection of R ankle S/p I&D with orthopedic surgery on 4/1 and 4/7. Per plastic surgery, plan to go to the OR next week for irrigation and debridement of right lateral ankle wound with placement of myriad and wound vac change. - Continue cefazoline 2g IV q8h. OPAT established with end date of 08/25/23 - Plan for OR today with plastic surgery - DME orders in for hospital bed and drop arm commode - Pain control with oral analgesics: tylenol 650mg  q6h, gabapentin 100mg  at bedtime, oxycodone 10mg  q3h prn, added Robaxin 1500 mg every 6 hours as needed for the next 3 days - non-weight bearing on right lower extremity - bowel regimen with miralax BID, senna-docusate BID - PT following  Acute on chronic anemia Hereditary hemorrhagic telangiectasias Patient reports a minor nosebleed a few days ago, but new acute bleeding - Continue  amicar 1000mg  q12h - Lovenox for DVT ppx  Chronic stable conditions HTN: continue home amlodipine 10mg , lisinopril 20mg  GERD: continue home pantoprazole 40mg   Depression: continue home citalopram 20mg , hydroxyzine 25mg  TID prn Insomnia: continue home trazodone 50mg  at bedtime prn  Diet: Regular VTE: Lovenox Code: Full  Dispo: Anticipated discharge to home after procedure 4/14 and upon delivery of DME   Jayson Michael, MD Internal Medicine Resident, PGY-1 Please contact the on call pager at [phone] for any urgent or emergent needs. 10:31 AM 07/28/2023

## 2023-07-28 NOTE — Plan of Care (Signed)
   Problem: Education: Goal: Knowledge of General Education information will improve Description Including pain rating scale, medication(s)/side effects and non-pharmacologic comfort measures Outcome: Progressing   Problem: Clinical Measurements: Goal: Will remain free from infection Outcome: Progressing   Problem: Activity: Goal: Risk for activity intolerance will decrease Outcome: Progressing   Problem: Nutrition: Goal: Adequate nutrition will be maintained Outcome: Progressing

## 2023-07-28 NOTE — Anesthesia Procedure Notes (Signed)
 Procedure Name: LMA Insertion Date/Time: 07/28/2023 2:53 PM  Performed by: Artemisa Bile D, CRNAPre-anesthesia Checklist: Patient identified, Emergency Drugs available, Suction available and Patient being monitored Patient Re-evaluated:Patient Re-evaluated prior to induction Oxygen Delivery Method: Circle System Utilized Preoxygenation: Pre-oxygenation with 100% oxygen Induction Type: IV induction Ventilation: Mask ventilation without difficulty LMA: LMA inserted LMA Size: 4.0 Number of attempts: 1 Airway Equipment and Method: Bite block Placement Confirmation: positive ETCO2 Tube secured with: Tape Dental Injury: Teeth and Oropharynx as per pre-operative assessment

## 2023-07-28 NOTE — Progress Notes (Signed)
 OT Cancellation Note  Patient Details Name: Peggy House MRN: [id number] DOB: 1960/10/15   Cancelled Treatment:    Reason Eval/Treat Not Completed: Patient at procedure or test/ unavailable  Karilyn Ouch, OTR/L [facility] Acute Rehabilitation Office: [phone]   Peggy House 07/28/2023, 2:49 PM

## 2023-07-29 ENCOUNTER — Encounter (HOSPITAL_COMMUNITY): Payer: Self-pay | Admitting: Plastic Surgery

## 2023-07-29 DIAGNOSIS — S91001D Unspecified open wound, right ankle, subsequent encounter: Secondary | ICD-10-CM | POA: Diagnosis not present

## 2023-07-29 DIAGNOSIS — S91001S Unspecified open wound, right ankle, sequela: Secondary | ICD-10-CM

## 2023-07-29 DIAGNOSIS — S91001A Unspecified open wound, right ankle, initial encounter: Secondary | ICD-10-CM | POA: Diagnosis not present

## 2023-07-29 LAB — BASIC METABOLIC PANEL WITH GFR
Anion gap: 8 (ref 5–15)
BUN: 12 mg/dL (ref 8–23)
CO2: 23 mmol/L (ref 22–32)
Calcium: 8.6 mg/dL — ABNORMAL LOW (ref 8.9–10.3)
Chloride: 109 mmol/L (ref 98–111)
Creatinine, Ser: 0.98 mg/dL (ref 0.44–1.00)
GFR, Estimated: >60 mL/min (ref >60)
Glucose, Bld: 186 mg/dL — ABNORMAL HIGH (ref 70–99)
Potassium: 4.2 mmol/L (ref 3.5–5.1)
Sodium: 140 mmol/L (ref 135–145)

## 2023-07-29 LAB — CBC
HCT: 28.3 % — ABNORMAL LOW (ref 36.0–46.0)
Hemoglobin: 8.3 g/dL — ABNORMAL LOW (ref 12.0–15.0)
MCH: 25.9 pg — ABNORMAL LOW (ref 26.0–34.0)
MCHC: 29.3 g/dL — ABNORMAL LOW (ref 30.0–36.0)
MCV: 88.4 fL (ref 80.0–100.0)
Platelets: 458 10*3/uL — ABNORMAL HIGH (ref 150–400)
RBC: 3.2 MIL/uL — ABNORMAL LOW (ref 3.87–5.11)
RDW: 20.1 % — ABNORMAL HIGH (ref 11.5–15.5)
WBC: 9.7 10*3/uL (ref 4.0–10.5)
nRBC: 0 % (ref 0.0–0.2)

## 2023-07-29 LAB — GLUCOSE, CAPILLARY: Glucose-Capillary: 173 mg/dL — ABNORMAL HIGH (ref 70–99)

## 2023-07-29 NOTE — Progress Notes (Signed)
 1 Day Post-Op  Subjective: Patient is a 63 year old female who had a right ankle repair back in January who subsequently developed an infection of the area.  Patient subsequently underwent right ankle I&D along with removal of her hardware and wound VAC application with Dr. Hulda Humphrey on 07/15/2023.   Patient then underwent excision of right ankle wound with placement of myriad and wound VAC with Dr. Ulice Bold yesterday, 07/28/2023.  Upon entering the room today, patient is sitting upright in no acute distress in her chair.  She states that she did not sleep well last night.  She states though that her pain has been controlled to her ankle.  She denies any other issues overnight.  Objective: Vital signs in last 24 hours: Temp:  [98.1 F (36.7 C)-99 F (37.2 C)] 98.5 F (36.9 C) (04/15 1434) Pulse Rate:  [53-65] 54 (04/15 0733) Resp:  [13-18] 16 (04/15 1434) BP: (123-151)/(58-72) 127/58 (04/15 1434) SpO2:  [91 %-100 %] 100 % (04/15 1434) Last BM Date : 07/26/23  Intake/Output from previous day: 04/14 0701 - 04/15 0700 In: 290 [P.O.:240; I.V.:50] Out: 1200 [Urine:1200] Intake/Output this shift: Total I/O In: 360 [P.O.:360] Out: 0   General appearance: alert, cooperative, and no distress Resp: Unlabored breathing, no respiratory distress Extremities: Dressings to the right lower extremity are clean dry and intact.  Patient is able to wiggle her toes and sensation is intact to the toes.  Patient has good capillary refill noted.  Wound VAC is in place and functioning with very minimal serosanguineous drainage in the canister.  Lab Results:     Latest Ref Rng & Units 07/29/2023    4:01 AM 07/28/2023    4:50 AM 07/23/2023    2:33 AM  CBC  WBC 4.0 - 10.5 K/uL 9.7  8.4  15.5   Hemoglobin 12.0 - 15.0 g/dL 8.3  7.7  7.9   Hematocrit 36.0 - 46.0 % 28.3  26.4  27.5   Platelets 150 - 400 K/uL 458  443  534     BMET Recent Labs    07/28/23 0450 07/29/23 0401  NA 140 140  K 3.8 4.2  CL 108  109  CO2 25 23  GLUCOSE 113* 186*  BUN 12 12  CREATININE 0.85 0.98  CALCIUM 8.3* 8.6*   PT/INR No results for input(s): "LABPROT", "INR" in the last 72 hours. ABG No results for input(s): "PHART", "HCO3" in the last 72 hours.  Invalid input(s): "PCO2", "PO2"  Studies/Results: No results found.  Anti-infectives: Anti-infectives (From admission, onward)    Start     Dose/Rate Route Frequency Ordered Stop   07/28/23 1115  ceFAZolin (ANCEF) IVPB 2g/100 mL premix  Status:  Discontinued        2 g 200 mL/hr over 30 Minutes Intravenous On call to O.R. 07/28/23 1025 07/28/23 1049   07/17/23 2200  ceFAZolin (ANCEF) IVPB 2g/100 mL premix        2 g 200 mL/hr over 30 Minutes Intravenous Every 8 hours 07/17/23 0951     07/17/23 0000  ceFAZolin (ANCEF) IVPB        2 g Intravenous Every 8 hours 07/17/23 1309 08/25/23 2359   07/14/23 2300  vancomycin (VANCOREADY) IVPB 1500 mg/300 mL  Status:  Discontinued        1,500 mg 150 mL/hr over 120 Minutes Intravenous Every 24 hours 07/13/23 2221 07/17/23 0951   07/13/23 2145  vancomycin (VANCOREADY) IVPB 2000 mg/400 mL        2,000 mg  200 mL/hr over 120 Minutes Intravenous  Once 07/13/23 2142 07/14/23 0143   07/13/23 2045  cefTRIAXone (ROCEPHIN) 2 g in sodium chloride 0.9 % 100 mL IVPB  Status:  Discontinued        2 g 200 mL/hr over 30 Minutes Intravenous Every 24 hours 07/13/23 2036 07/16/23 1448       Assessment/Plan: s/p Procedure(s): IRRIGATION AND DEBRIDEMENT WOUND APPLICATION, SKIN SUBSTITUTE APPLICATION, WOUND VAC Plan: - Patient doing well postop day 1.  Discussed with patient that we will plan to change the wound VAC at bedside if she is still admitted in the hospital.  Discussed with her that if she is discharged, she will need to make an appointment with her clinic next week to change the wound VAC in the clinic.  Patient expressed understanding. -Patient is okay to be discharged from plastic surgery standpoint. -I instructed  the patient to call our office if she has any questions or concerns about anything. -Medical management, pain control and discharge plan per primary team.  LOS: 16 days    [name], PA-C 07/29/2023

## 2023-07-29 NOTE — TOC Progression Note (Addendum)
 Transition of Care [facility]) - Progression Note    Patient Details  Name: Peggy House MRN: [id number] Date of Birth: [birth year]  [facility] CM/SW Contact  Omie Bickers, RN Phone Number: 07/29/2023, 4:00 PM  Clinical Narrative:     Discuss lack of HH RN availability with team  today. Dr Orin Birk states she can come to the office once a week for VAC change. First appointment will need to made closer to [place].  Patient states that she has transportation through either her son or her neighbor down the street who both have cars.  Notified Traci w [employer] to send forms to Dr Orin Birk   Expected Discharge Plan: Home w Home Health Services Barriers to Discharge: Continued Medical Work up  Expected Discharge Plan and Services   Discharge Planning Services: CM Consult   Living arrangements for the past 2 months: Apartment                 DME Arranged: Hospital bed, Bedside commode DME Agency: Other - Comment ([facility]) Date DME Agency Contacted: 07/24/23 Time DME Agency Contacted: [phone] Representative spoke with at DME Agency: Gladys Lamp HH Arranged: PT, RN HH Agency: [facility] Date HH Agency Contacted: 07/17/23 Time HH Agency Contacted: [phone] Representative spoke with at [facility] Agency: [name]   Social Determinants of Health (SDOH) Interventions SDOH Screenings   Food Insecurity: No Food Insecurity (07/14/2023)  Housing: Low Risk  (07/14/2023)  Transportation Needs: No Transportation Needs (07/14/2023)  Utilities: Not At Risk (07/14/2023)  Depression (PHQ2-9): High Risk (07/04/2023)  Tobacco Use: High Risk (07/28/2023)    Readmission Risk Interventions    04/18/2023    3:07 PM 04/15/2023    2:22 PM  Readmission Risk Prevention Plan  Transportation Screening Complete Complete  PCP or Specialist Appt within 5-7 Days  Complete  PCP or Specialist Appt within 3-5 Days Complete   Home Care Screening  Complete  Medication Review (RN CM)  Complete  HRI or Home  Care Consult Complete   Social Work Consult for Recovery Care Planning/Counseling Complete   Palliative Care Screening Complete   Medication Review [employer]) Complete

## 2023-07-29 NOTE — Plan of Care (Signed)
   Problem: Education: Goal: Knowledge of General Education information will improve Description: Including pain rating scale, medication(s)/side effects and non-pharmacologic comfort measures Outcome: Progressing   Problem: Activity: Goal: Risk for activity intolerance will decrease Outcome: Progressing   Problem: Pain Managment: Goal: General experience of comfort will improve and/or be controlled Outcome: Progressing

## 2023-07-29 NOTE — Progress Notes (Signed)
 Physical Therapy Treatment Patient Details Name: Peggy House MRN: [id number] DOB: 02-Jul-1960 Today's Date: 07/29/2023   History of Present Illness Peggy House is a 63 y.o. female admitted 3/30 with dehiscence of her incisions from right ankle ORIF in Jan.  Pt has been weight bearing at times on right foot and developed drainage and cellulitis.  I&D right ankle with wound VAC placement 4/1.   Repeat I&D 4/7; s/p Excision of right ankle wound 2 x 7 cm skin and soft tissue with myriad 500 mg and 5 x 5 cm sheet and VAC placement on 4/14 by Plastic Surgery; PMH of HHT, chronic anemia, HTN, T2DM, hx of GI bleed, tobacco use,  Right ankle ORIF on 04/24/23 with SNF rehab stay through mid Feb.    PT Comments  Continuing work on functional mobility and activity tolerance;  Session focused on functional transfers, and challenging Peggy House to work on sit to stand and stand pivot transfers; She is already able to manage with lateral scooting quite well per chart review and discussion with Peggy House, Mobility Specialist; To work on upright standing in single limb stance gives Peggy House the stimulus to keep standing tolerance, gives Peggy House hips the opportunity to stretch to full extension (without having to go prone on the bed), and all of this will serve her well when she heals up to be able to put weight on the RLE again; Overall she kept NWB RLE well during sit to stand, heel-toe pivots on the LLE, and stand to sit; On track for DC home tomorrow   If plan is discharge home, recommend the following: A little help with walking and/or transfers;A little help with bathing/dressing/bathroom;Assistance with cooking/housework;Assist for transportation;Help with stairs or ramp for entrance   Can travel by private vehicle        Equipment Recommendations  Hospital bed;Other (comment) (Drop arm commode) Consider long sliding board   Recommendations for Other Services       Precautions / Restrictions  Precautions Precautions: Fall Recall of Precautions/Restrictions: Intact Precaution/Restrictions Comments: Wound vac Restrictions RLE Weight Bearing Per Provider Order: Non weight bearing     Mobility  Bed Mobility Overal bed mobility: Modified Independent       Supine to sit: Modified independent (Device/Increase time)          Transfers Overall transfer level: Needs assistance Equipment used: Rolling walker (2 wheels) Transfers: Sit to/from Stand, Bed to chair/wheelchair/BSC Sit to Stand: Mod assist, +2 safety/equipment Stand pivot transfers: Min assist, +2 safety/equipment         General transfer comment: Able to heel-toe pivot on her LLE with UE support from RW    Ambulation/Gait                   Stairs             Wheelchair Mobility     Tilt Bed    Modified Rankin (Stroke Patients Only)       Balance     Sitting balance-Leahy Scale: Good       Standing balance-Leahy Scale: Poor                              Communication Communication Communication: No apparent difficulties  Cognition Arousal: Alert Behavior During Therapy: WFL for tasks assessed/performed   PT - Cognitive impairments: No apparent impairments  Following commands: Intact      Cueing Cueing Techniques: Verbal cues, Visual cues  Exercises      General Comments General comments (skin integrity, edema, etc.): Peggy House indicated she hesitates to practice standing because she is nervous; We discussed benefits of practicing standing, and this is a good time, while she has assistance      Pertinent Vitals/Pain Pain Assessment Pain Assessment: Faces Faces Pain Scale: Hurts even more Pain Location: Right LE Pain Descriptors / Indicators: Discomfort, Grimacing Pain Intervention(s): Premedicated before session, Monitored during session    Home Living                          Prior Function             PT Goals (current goals can now be found in the care plan section) Acute Rehab PT Goals Patient Stated Goal: to go home PT Goal Formulation: With patient Time For Goal Achievement: 07/28/23 (Original goals set continue to be appropriate) Potential to Achieve Goals: Good Progress towards PT goals: Progressing toward goals    Frequency    Min 2X/week      PT Plan      Co-evaluation              AM-PAC PT "6 Clicks" Mobility   Outcome Measure  Help needed turning from your back to your side while in a flat bed without using bedrails?: A Little Help needed moving from lying on your back to sitting on the side of a flat bed without using bedrails?: A Little Help needed moving to and from a bed to a chair (including a wheelchair)?: A Little Help needed standing up from a chair using your arms (e.g., wheelchair or bedside chair)?: A Lot Help needed to walk in hospital room?: Total Help needed climbing 3-5 steps with a railing? : Total 6 Click Score: 13    End of Session Equipment Utilized During Treatment: Gait belt Activity Tolerance: Patient tolerated treatment well Patient left: in chair;with call bell/phone within reach;with chair alarm set Nurse Communication: Mobility status PT Visit Diagnosis: Unsteadiness on feet (R26.81);Other abnormalities of gait and mobility (R26.89);Muscle weakness (generalized) (M62.81);Pain Pain - Right/Left: Right Pain - part of House: Leg;Ankle and joints of foot     Time: 1135-1204 PT Time Calculation (min) (ACUTE ONLY): 29 min  Charges:    $Therapeutic Activity: 23-37 mins PT General Charges $$ ACUTE PT VISIT: 1 Visit                     Peggy House, PT  Acute Rehabilitation Services Office [phone] Secure Chat welcomed    Marcial Setting 07/29/2023, 3:07 PM

## 2023-07-29 NOTE — Progress Notes (Signed)
                  Subjective:   Summary: 63 yo F with PMH of HHT, chronic anemia, HTN, T2DM, recent R ankle fracture with ORIF, presenting with wound dehiscence and admitted for MSSA infection of R ankle  Patient is feeling fine this morning.  She feels her surgery went very well yesterday.  Pain is very well-controlled with Tylenol and oxycodone.  She has a lot of questions about her wound care after discharge.  I assured her we will be sure to establish a plan with plastic surgery and IMTS prior to discharge.  Objective:  Vital signs in last 24 hours: Vitals:   07/28/23 1922 07/29/23 0005 07/29/23 0431 07/29/23 0733  BP: 123/67 138/61 (!) 151/64 (!) 149/64  Pulse: 64 65 (!) 53 (!) 54  Resp: 17 18 18 18   Temp: 99 F (37.2 C) 98.5 F (36.9 C) 98.1 F (36.7 C) 98.2 F (36.8 C)  TempSrc: Oral Oral  Oral  SpO2: 96% 97% 91% 97%  Weight:      Height:       Supplemental O2: Nasal Cannula SpO2: 97 % O2 Flow Rate (L/min): 2 L/min  Physical Exam:  Constitutional: well-appearing, in some discomfort. Clean bandages around R ankle Cardiac: systolic murmur over LUSB Skin: warm, well-perfused, no increased temperature or erythema in the right leg above the ankle wrap  Assessment/Plan:   Peri-implant MSSA infection of R ankle S/p I&D with orthopedic surgery on 4/1 and 4/7. S/p excision of right ankle wound, wound vac change by plastic surgery on 4/14. Patient will need a plan for wound care and wound vac changes prior to discharge, as well as delivery of hospital bed to her home.  - Continue cefazoline 2g IV q8h. OPAT established with end date of 08/25/23 - DME orders in for hospital bed and drop arm commode - Pain control with oral analgesics: tylenol 650mg  q6h, gabapentin 100mg  at bedtime, oxycodone 10mg  q3h prn - bowel regimen with miralax BID, senna-docusate BID - PT following  Acute on chronic anemia Hereditary hemorrhagic telangiectasias Patient reports a minor nosebleed a few  days ago, but new acute bleeding - Continue amicar 1000mg  q12h - Lovenox for DVT ppx  Chronic stable conditions HTN: continue home amlodipine 10mg , lisinopril 20mg  GERD: continue home pantoprazole 40mg   Depression: continue home citalopram 20mg , hydroxyzine 25mg  TID prn Insomnia: continue home trazodone 50mg  at bedtime prn  Diet: Regular VTE: Lovenox Code: Full  Dispo: Anticipated discharge to home upon delivery of DME and with wound care plan established  Jayson Michael, MD Internal Medicine Resident, PGY-1 Please contact the on call pager at [phone] for any urgent or emergent needs. 12:36 PM 07/29/2023

## 2023-07-30 ENCOUNTER — Other Ambulatory Visit (HOSPITAL_COMMUNITY): Payer: Self-pay

## 2023-07-30 ENCOUNTER — Ambulatory Visit: Payer: Medicaid Other

## 2023-07-30 DIAGNOSIS — T84629A Infection and inflammatory reaction due to internal fixation device of unspecified bone of leg, initial encounter: Secondary | ICD-10-CM | POA: Diagnosis not present

## 2023-07-30 MED ORDER — METHOCARBAMOL 750 MG PO TABS
750.0000 mg | ORAL_TABLET | Freq: Three times a day (TID) | ORAL | 0 refills | Status: AC
  Filled 2023-07-30: qty 30, 10d supply, fill #0

## 2023-07-30 MED ORDER — OXYCODONE HCL 10 MG PO TABS
10.0000 mg | ORAL_TABLET | Freq: Four times a day (QID) | ORAL | 0 refills | Status: AC | PRN
  Filled 2023-07-30: qty 20, 5d supply, fill #0

## 2023-07-30 NOTE — Discharge Instructions (Addendum)
 [name], Peggy House were hospitalized for a post-op foot infection. After multiple surgeries by our orthopedic and plastic surgery teams, I feel comfortable discharging you from the hospital with follow up planned for wound vac changes, as well as continued antibiotic therapy. Thank you for allowing us  to be part of your care.   We arranged for you to follow up at: [facility] on 4/24 9:45 with Dr Rozelle Corning The plastic surgery team will also be in contact to schedule an appointment with their office.  We will also be in touch to schedule an appointment with our infectious disease clinic.   Please note these changes made to your medications:   *Please START taking:  Oxycodone 10mg  every 6 hours as needed for foot pain Robaxin 750mg  three times daily as needed for muscle spasms Please make sure to return to the hospital if you are having worsening ankle pain, fever, weakness, or you feel there are any other signs of infection in your foot.  Please call our clinic if you have any questions or concerns, we may be able to help and keep you from a long and expensive emergency room wait. Our clinic and after hours phone number is [phone], the best time to call is Monday through Friday 9 am to 4 pm but there is always someone available 24/7 if you have an emergency. If you need medication refills please notify your pharmacy one week in advance and they will send us  a request.   Dr Esaw Heckler

## 2023-07-30 NOTE — Progress Notes (Signed)
 Physical Therapy Treatment Patient Details Name: Peggy House MRN: [id number] DOB: 1960-12-14 Today's Date: 07/30/2023   History of Present Illness Peggy House is a 63 y.o. female admitted 3/30 with dehiscence of her incisions from right ankle ORIF in Jan.  Pt has been weight bearing at times on right foot and developed drainage and cellulitis.  I&D right ankle with wound VAC placement 4/1.   Repeat I&D 4/7; s/p Excision of right ankle wound 2 x 7 cm skin and soft tissue with myriad 500 mg and 5 x 5 cm sheet and VAC placement on 4/14 by Plastic Surgery; PMH of HHT, chronic anemia, HTN, T2DM, hx of GI bleed, tobacco use,  Right ankle ORIF on 04/24/23 with SNF rehab stay through mid Feb.    PT Comments  Continuing work on functional mobility and activity tolerance;  Session focused on functional transfers, and practicing using a sliding board to consider ordering if it will be helpful at home (in particular for car transfers; Overall Peggy House preformed the transfer well, and was able to weight shift for placement of the board well; Ultimately, Peggy House decided the sliding board was too cumbersome, and we decided to cancel the order; Anticipate DC home tomorrow --- pt is on track for dc home; she does not need a PT session tomorrow prior to discharging;   Peggy House indicated she very much wants to be able to get outside today; Told her she will need an order, and to be in a wheelchair that has an elevating legrest (her friend plans to bring in hers); She is willing to hold the [facility] machine in her lap   If plan is discharge home, recommend the following: A little help with walking and/or transfers;A little help with bathing/dressing/bathroom;Assistance with cooking/housework;Assist for transportation;Help with stairs or ramp for entrance   Can travel by private vehicle        Equipment Recommendations  Hospital bed;Other (comment) (Drop arm commode)    Recommendations for Other Services        Precautions / Restrictions Precautions Precautions: Fall Recall of Precautions/Restrictions: Intact Precaution/Restrictions Comments: Wound vac Restrictions RLE Weight Bearing Per Provider Order: Non weight bearing     Mobility  Bed Mobility Overal bed mobility: Modified Independent             General bed mobility comments: Slow and intentional movements    Transfers Overall transfer level: Needs assistance Equipment used: Sliding board Transfers: Bed to chair/wheelchair/BSC            Lateral/Scoot Transfers: Contact guard assist General transfer comment: performed lateral scoot transfer bed to recliner with sliding board after this PT demonstrated technqiue;contactguard assist, mostly for steadying the recliner    Ambulation/Gait               General Gait Details: N/A as pt will need to be wheelchair level.   Stairs             Wheelchair Mobility     Tilt Bed    Modified Rankin (Stroke Patients Only)       Balance     Sitting balance-Leahy Scale: Good       Standing balance-Leahy Scale: Poor                              Communication Communication Communication: No apparent difficulties  Cognition Arousal: Alert Behavior During Therapy: WFL for tasks assessed/performed   PT - Cognitive impairments:  No apparent impairments                         Following commands: Intact      Cueing Cueing Techniques: Verbal cues, Visual cues  Exercises      General Comments General comments (skin integrity, edema, etc.): Discussed consderations for dc home, and strategies for keeping NWB RLE; asked pt's friend to bring in her wheelchair      Pertinent Vitals/Pain Pain Assessment Pain Assessment: Faces Faces Pain Scale: Hurts little more Pain Location: Right LE Pain Descriptors / Indicators: Discomfort, Grimacing Pain Intervention(s): Monitored during session    Home Living                           Prior Function            PT Goals (current goals can now be found in the care plan section) Acute Rehab PT Goals Patient Stated Goal: to go home PT Goal Formulation: With patient Time For Goal Achievement: 07/28/23 (Original goals set continue to be appropriate) Potential to Achieve Goals: Good Progress towards PT goals: Progressing toward goals    Frequency    Min 2X/week      PT Plan      Co-evaluation              AM-PAC PT "6 Clicks" Mobility   Outcome Measure  Help needed turning from your back to your side while in a flat bed without using bedrails?: None Help needed moving from lying on your back to sitting on the side of a flat bed without using bedrails?: None Help needed moving to and from a bed to a chair (including a wheelchair)?: A Little Help needed standing up from a chair using your arms (e.g., wheelchair or bedside chair)?: A Lot Help needed to walk in hospital room?: Total   6 Click Score: 14    End of Session   Activity Tolerance: Patient tolerated treatment well Patient left: in chair;with call bell/phone within reach;with chair alarm set Nurse Communication: Mobility status PT Visit Diagnosis: Unsteadiness on feet (R26.81);Other abnormalities of gait and mobility (R26.89);Muscle weakness (generalized) (M62.81);Pain Pain - Right/Left: Right Pain - part of body: Leg;Ankle and joints of foot     Time: 1327-1400 PT Time Calculation (min) (ACUTE ONLY): 33 min  Charges:    $Therapeutic Activity: 23-37 mins PT General Charges $$ ACUTE PT VISIT: 1 Visit                     Darcus Eastern, PT  Acute Rehabilitation Services Office [phone] Secure Chat welcomed    Marcial Setting 07/30/2023, 5:59 PM

## 2023-07-30 NOTE — TOC Transition Note (Addendum)
 Transition of Care [facility]) - Discharge Note   Patient Details  Name: Peggy House MRN: [id number] Date of Birth: 06-14-1960  Transition of [facility]) CM/SW Contact:  Omie Bickers, RN Phone Number: 07/30/2023, 9:58 AM   Clinical Narrative:     09:50 Patient states that bed will be delivered to her home in a few minutes.  She confirms that she will have her own ride home.  [employer] liaison states she is waiting on Dr Chet Cota to sign order and [employer] form, these have been emailed to her. I have sent secure chat reminding MD to sign forms.   11:50 confirmed office will schedule follow up for VAC change next week. Ordered slide board from [employer]   13:00 Spoke w [name] from [facility], and she would like to retrain patient and friend KiKi on IV administration of meds. She will obtain prior auth for meds and [facility] with [facility] to start potentially Thursday or Friday.   13:30 Spoke w Dr Orin Birk, requested to sign order and Medicare form in her email and send back to [facility].  14:30 [name] states that they can a nurse at the house tomorrow around 4-5pm. If patient not discharged in time to be home by 4-5pm tomorrow the next availability for a nurse is going to be Monday.        Barriers to Discharge: Continued Medical Work up   Patient Goals and CMS Choice     Choice offered to / list presented to : Patient      Discharge Placement                       Discharge Plan and Services Additional resources added to the After Visit Summary for     Discharge Planning Services: CM Consult            DME Arranged: Hospital bed, Bedside commode DME Agency: Other - Comment ([facility]) Date DME Agency Contacted: 07/24/23 Time DME Agency Contacted: [phone] Representative spoke with at DME Agency: Gladys Lamp HH Arranged: PT, RN HH Agency: [facility] Date HH Agency Contacted: 07/17/23 Time HH Agency Contacted: [phone] Representative spoke with at [facility] Agency:  [name]  Social Drivers of Health (SDOH) Interventions SDOH Screenings   Food Insecurity: No Food Insecurity (07/14/2023)  Housing: Low Risk  (07/14/2023)  Transportation Needs: No Transportation Needs (07/14/2023)  Utilities: Not At Risk (07/14/2023)  Depression (PHQ2-9): High Risk (07/04/2023)  Tobacco Use: High Risk (07/28/2023)     Readmission Risk Interventions    04/18/2023    3:07 PM 04/15/2023    2:22 PM  Readmission Risk Prevention Plan  Transportation Screening Complete Complete  PCP or Specialist Appt within 5-7 Days  Complete  PCP or Specialist Appt within 3-5 Days Complete   Home Care Screening  Complete  Medication Review (RN CM)  Complete  HRI or Home Care Consult Complete   Social Work Consult for Recovery Care Planning/Counseling Complete   Palliative Care Screening Complete   Medication Review Oceanographer) Complete

## 2023-07-30 NOTE — Progress Notes (Cosign Needed)
                  Subjective:   Summary: 63 yo F with PMH of HHT, chronic anemia, HTN, T2DM, recent R ankle fracture with ORIF, presenting with wound dehiscence and admitted for MSSA infection of R ankle  Patient feeling well this morning. She feels ready to be discharged, however will need some training with IV antibiotic administration and have a sliding board delivered. Anticipate discharge tomorrow.   Objective:  Vital signs in last 24 hours: Vitals:   07/29/23 2009 07/30/23 0509 07/30/23 0600 07/30/23 0745  BP: 127/73 (!) 148/67  139/72  Pulse: (!) 55 (!) 49 (!) 50 (!) 50  Resp: 17 17  18   Temp: 98.5 F (36.9 C) 98.7 F (37.1 C)  98.1 F (36.7 C)  TempSrc: Oral Oral  Oral  SpO2: 100% 97%  99%  Weight:      Height:       Supplemental O2: Nasal Cannula SpO2: 99 % O2 Flow Rate (L/min): 2 L/min  Physical Exam:  Constitutional: well-appearing, in some discomfort. Clean bandages around R ankle Cardiac: systolic murmur over LUSB Skin: warm, well-perfused, no increased temperature or erythema in the right leg above the ankle wrap  Assessment/Plan:   Peri-implant MSSA infection of R ankle S/p I&D with orthopedic surgery on 4/1 and 4/7. S/p excision of right ankle wound, wound vac change by plastic surgery on 4/14. Patient is medically stable for discharge. Dispo is pending further training for IV antibiotic administration, wound vac orders per surgery team, and authorization for home antibiotics. Anticipate discharge tomorrow.  - Continue cefazoline 2g IV q8h. OPAT established with end date of 08/25/23 - DME orders in for hospital bed sliding board. Hospital bed has been delivered to her home - Pain control with oral analgesics: tylenol 650mg  q6h, gabapentin 100mg  at bedtime, oxycodone 10mg  q3h prn - bowel regimen with miralax BID, senna-docusate BID - PT following  Acute on chronic anemia Hereditary hemorrhagic telangiectasias Patient reports a minor nosebleed a few days  ago, but new acute bleeding - Continue amicar 1000mg  q12h - Lovenox for DVT ppx  Chronic stable conditions HTN: continue home amlodipine 10mg , lisinopril 20mg  GERD: continue home pantoprazole 40mg   Depression: continue home citalopram 20mg , hydroxyzine 25mg  TID prn Insomnia: continue home trazodone 50mg  at bedtime prn  Diet: Regular VTE: Lovenox Code: Full  Dispo: Anticipated discharge to home upon delivery of DME and with wound care plan established  Jayson Michael, MD Internal Medicine Resident, PGY-1 Please contact the on call pager at [phone] for any urgent or emergent needs. 12:52 PM 07/30/2023

## 2023-07-30 NOTE — Discharge Summary (Deleted)
 Name: Peggy House MRN: [id number] DOB: 1960/12/07 63 y.o. PCP: Olegario Messier, MD  Date of Admission: 07/13/2023  3:32 PM Date of Discharge: 07/30/23 Attending Physician: Dr. 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) Active Problems:   Hypertension   Diabetes mellitus (HCC)   Hereditary hemorrhagic telangiectasia (HCC)   Acute anemia   Wound dehiscence   Subacute osteomyelitis of right ankle (HCC)   Pyogenic inflammation of bone (HCC)   Ankle wound, right, initial encounter   Ankle wound, right, sequela    Discharge Medications: Allergies as of 07/30/2023       Reactions   Feraheme [ferumoxytol] Other (See Comments)   Muscle tetany, encephalopathy, fatigue, nausea (reaction to injection)   Nsaids Other (See Comments)   Stomach bleeding episodes; Tylenol is OK   Wasp Venom Anaphylaxis   Tomato Hives   Iron (ferrous Sulfate) [ferrous Sulfate Er] Other (See Comments)   Shock        Medication List     STOP taking these medications    ALPRAZolam 0.5 MG tablet Commonly known as: XANAX   diclofenac Sodium 1 % Gel Commonly known as: Voltaren Arthritis Pain   traMADol 50 MG tablet Commonly known as: Ultram       TAKE these medications    Accu-Chek Guide test strip Generic drug: glucose blood Check blood sugar 2 times per day while on steroids before breakfast and dinner   Accu-Chek Softclix Lancets lancets Use to check blood sugar before breakfast and before dinner while on steroids   acetaminophen 325 MG tablet Commonly known as: Tylenol Take 2 tablets (650 mg total) by mouth every 6 (six) hours as needed.   aminocaproic acid 500 MG tablet Commonly known as: Amicar Take 2 tablets (1,000 mg total) by mouth every 12 (twelve) hours.   amLODipine 10 MG tablet Commonly known as: NORVASC Take 1 tablet (10 mg total) by mouth daily.   ceFAZolin  IVPB Commonly known as: ANCEF Inject 2 g into the vein every 8 (eight) hours. Indication:  MSSA R-foot osteo First Dose: Yes Last Day of Therapy:  08/25/23 Labs - Once weekly:  CBC/D and BMP, Labs - Once weekly: ESR and CRP Method of administration: IV Push Method of administration may be changed at the discretion of home infusion pharmacist based upon assessment of the patient and/or caregiver's ability to self-administer the medication ordered.   citalopram 20 MG tablet Commonly known as: CeleXA Take 1 tablet (20 mg total) by mouth daily.   gabapentin 100 MG capsule Commonly known as: Neurontin Take 1 capsule (100 mg total) by mouth at bedtime.   lisinopril 20 MG tablet Commonly known as: ZESTRIL Take 1 tablet (20 mg total) by mouth daily.   methocarbamol 750 MG tablet Commonly known as: Robaxin-750 Take 1 tablet (750 mg total) by mouth 3 (three) times daily for 10 days.   Oxycodone HCl 10 MG Tabs Take 1 tablet (10 mg total) by mouth every 6 (six) hours as needed for up to 7 days for severe pain (pain score 7-10).   pantoprazole 40 MG tablet Commonly known as: PROTONIX TAKE 1 TABLET(40 MG) BY MOUTH TWICE DAILY What changed: See the new instructions.   ProAir HFA 108 (90 Base) MCG/ACT inhaler Generic drug: albuterol INHALE 1 TO 2 PUFFS INTO THE LUNGS EVERY 6 HOURS AS NEEDED FOR WHEEZING OR SHORTNESS OF BREATH   Senna-S 8.6-50 MG tablet Generic drug:  senna-docusate Take 1 tablet by mouth at bedtime.   traZODone 50 MG tablet Commonly known as: DESYREL Take 1 tablet (50 mg total) by mouth at bedtime as needed for sleep (if).               Durable Medical Equipment  (From admission, onward)           Start     Ordered   07/28/23 1210  For home use only DME Hospital bed  Once       Comments: There is an immediate need for a change in position and the beneficiary can operate the controls independently and make the adjustments.  Question Answer Comment   Length of Need 12 Months   Patient has (list medical condition): perioperative hardware infection of R foot s/p hardware removal and I&D   The above medical condition requires: Patient requires the ability to reposition frequently   Head must be elevated greater than: 45 degrees   Bed type Semi-electric      07/28/23 1210   07/24/23 1230  For home use only DME Bedside commode  Once       Comments: Drop arm commode  Question:  Patient needs a bedside commode to treat with the following condition  Answer:  Osteomyelitis (HCC)   07/24/23 1230              Discharge Care Instructions  (From admission, onward)           Start     Ordered   07/17/23 0000  Change dressing on IV access line weekly and PRN  (Home infusion instructions - [facility] )        07/17/23 1309            Disposition and follow-up:   [name] was discharged from [facility] in Stable condition.  At the hospital follow up visit please address:  1.  Follow-up:  Peri implant MSSA R ankle infection: ensure patient has been receiving outpatient antibiotic therapy, has wound care with plastic surgery established, PT/OT have been coming. Evaluate for signs of worsening infection  2.  Labs / imaging needed at time of follow-up: None  3.  Pending labs/ test needing follow-up: None  4.  Medication Changes  Started: Oxycodone 10mg  q6h for 5 days, Robaxin 750mg  TID for 10 days  Stopped:  Changed:  Abx -  Cefazolin (OPAT) End Date: 08/25/23  Follow-up Appointments:  Follow-up Information     Dillingham, Alena Bills, DO Follow up in 10 day(s).   Specialty: Plastic Surgery Contact information: [address] [place] [address] [phone]                [facility] on 4/24 at 9:45 with Dr [facility] Course by problem list:  Peri-implant MSSA infection of R ankle Patient s/p right bimalleolar ORIF on 04/24/23 presented with R ankle pain  with purulent drainage and dehiscence from the lateral incision site. Vancomycin and ceftriaxone were initiated. Orthopedic surgery was consulted, who performed removal of hardware and I&D on 4/1 and 4/7. Antibiotics were narrowed to cefazolin based on wound cultures positive for MSSE. Excision of the wound and wound vac change was performed by plastic surgery on 4/14. Physical and occupational therapy teams recommended discharge home with HHPT/OT. A hospital bed was also ordered and delivered to her home.   Patient was discharged home on 4/16 with outpatient plastic surgery follow up for wound  vac change planned for next week. OPAT has been established who will continue cefazolin with end date 08/25/23.    Acute on chronic anemia Hereditary hemorrhagic telangiectasias Patient has a history of HHT with frequent episodes of mucosal bleeding. Patient received pRBC transfusions x 3 during admission for hemoglobin below goal of 8 established by hematology. Patient's home Amicar was continued during admission. Risks of bleeding vs post-op DVT were considered and it was decided to continue DVT ppx with lovenox during admission. No significant bleeding occurred.   HTN: home amlodipine, lisinopril were continued GERD: home pantoprazole was continued Depression: home citalopram, hydroxyzine were continued Insomnia: home trazodone was continued   Discharge Subjective: Patient was feeling very well this morning.  She continues to have some pain in her right foot, though it is tolerable with p.o. oxycodone.  She feels ready to go home today.  Discussed discharge planning including home health PT/OT, outpatient antibiotic therapy, DME including hospital bed, and wound care with follow-up in plastic surgery clinic next week.  All questions answered  Discharge Exam:   BP 139/72 (BP Location: Left Arm)   Pulse (!) 50   Temp 98.1 F (36.7 C) (Oral)   Resp 18   Ht 5\' 4"  (1.626 m)   Wt 106.1 kg   SpO2 99%   BMI  40.17 kg/m  Constitutional: well-appearing, in no acute distress Neurological: alert & oriented x 3 Skin: warm and dry Extremities: R foot in clean and dry bandage with wound vac in place  Pertinent Labs, Studies, and Procedures:     Latest Ref Rng & Units 07/29/2023    4:01 AM 07/28/2023    4:50 AM 07/23/2023    2:33 AM  CBC  WBC 4.0 - 10.5 K/uL 9.7  8.4  15.5   Hemoglobin 12.0 - 15.0 g/dL 8.3  7.7  7.9   Hematocrit 36.0 - 46.0 % 28.3  26.4  27.5   Platelets 150 - 400 K/uL 458  443  534        Latest Ref Rng & Units 07/29/2023    4:01 AM 07/28/2023    4:50 AM 07/22/2023    2:09 AM  CMP  Glucose 70 - 99 mg/dL 161  096  045   BUN 8 - 23 mg/dL 12  12  14    Creatinine 0.44 - 1.00 mg/dL 4.09  8.11  9.14   Sodium 135 - 145 mmol/L 140  140  137   Potassium 3.5 - 5.1 mmol/L 4.2  3.8  4.3   Chloride 98 - 111 mmol/L 109  108  101   CO2 22 - 32 mmol/L 23  25  24    Calcium 8.9 - 10.3 mg/dL 8.6  8.3  8.5    DG Ankle Complete Right Result Date: 07/13/2023 CLINICAL DATA:  Right ankle wound dehiscence and drainage. Recent surgery in early January. Concern for infection. EXAM: RIGHT ANKLE - COMPLETE 3+ VIEW COMPARISON:  Right ankle x-rays dated April 24, 2023. FINDINGS: Postsurgical changes related to bimalleolar ORIF with syndesmotic fixation. No evidence of hardware failure or loosening. Mild tibiotalar and midfoot degenerative changes. Diffuse soft tissue swelling. Superficial subcutaneous emphysema along the lateral hindfoot. IMPRESSION: 1. Diffuse soft tissue swelling with superficial subcutaneous emphysema along the lateral hindfoot, presumably related to wound dehiscence. 2. Postsurgical changes without hardware complication. No radiographic evidence of osteomyelitis. Electronically Signed   By: Obie Dredge M.D.   On: 07/13/2023 17:04    Signed: Monna Fam, MD PGY-1 07/30/2023, 11:30 AM  Pager: [phone]

## 2023-07-30 NOTE — Plan of Care (Signed)
  Problem: Education: Goal: Knowledge of General Education information will improve Description: Including pain rating scale, medication(s)/side effects and non-pharmacologic comfort measures Outcome: Adequate for Discharge   Problem: Clinical Measurements: Goal: Ability to maintain clinical measurements within normal limits will improve Outcome: Adequate for Discharge Goal: Will remain free from infection Outcome: Adequate for Discharge   Problem: Activity: Goal: Risk for activity intolerance will decrease Outcome: Adequate for Discharge   Problem: Coping: Goal: Level of anxiety will decrease Outcome: Adequate for Discharge   Problem: Elimination: Goal: Will not experience complications related to bowel motility Outcome: Adequate for Discharge   Problem: Pain Managment: Goal: General experience of comfort will improve and/or be controlled Outcome: Adequate for Discharge

## 2023-07-31 ENCOUNTER — Other Ambulatory Visit (HOSPITAL_COMMUNITY): Payer: Self-pay

## 2023-07-31 DIAGNOSIS — I780 Hereditary hemorrhagic telangiectasia: Secondary | ICD-10-CM | POA: Diagnosis not present

## 2023-07-31 DIAGNOSIS — D649 Anemia, unspecified: Secondary | ICD-10-CM | POA: Diagnosis not present

## 2023-07-31 DIAGNOSIS — M86271 Subacute osteomyelitis, right ankle and foot: Secondary | ICD-10-CM | POA: Diagnosis not present

## 2023-07-31 DIAGNOSIS — M869 Osteomyelitis, unspecified: Secondary | ICD-10-CM | POA: Diagnosis not present

## 2023-07-31 DIAGNOSIS — T8130XA Disruption of wound, unspecified, initial encounter: Secondary | ICD-10-CM | POA: Diagnosis not present

## 2023-07-31 DIAGNOSIS — B9561 Methicillin susceptible Staphylococcus aureus infection as the cause of diseases classified elsewhere: Secondary | ICD-10-CM | POA: Diagnosis not present

## 2023-07-31 DIAGNOSIS — T84629A Infection and inflammatory reaction due to internal fixation device of unspecified bone of leg, initial encounter: Secondary | ICD-10-CM | POA: Diagnosis not present

## 2023-07-31 DIAGNOSIS — T8189XA Other complications of procedures, not elsewhere classified, initial encounter: Secondary | ICD-10-CM | POA: Diagnosis not present

## 2023-07-31 NOTE — Discharge Summary (Addendum)
 Name: Peggy House MRN: [id number] DOB: 1960-05-29 63 y.o. PCP: Olegario Messier, MD  Date of Admission: 07/13/2023  3:32 PM Date of Discharge: 07/31/23 Attending Physician: Dr. 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) Active Problems:   Open wound of right ankle; discharged with wound vac   Acute on chronic blood loss anemia   Hereditary hemorrhagic telangiectasia (HCC)   Hypertension   Diabetes mellitus (HCC)    Discharge Medications: Allergies as of 07/31/2023       Reactions   Feraheme [ferumoxytol] Other (See Comments)   Muscle tetany, encephalopathy, fatigue, nausea (reaction to injection)   Nsaids Other (See Comments)   Stomach bleeding episodes; Tylenol is OK   Wasp Venom Anaphylaxis   Tomato Hives   Iron (ferrous Sulfate) [ferrous Sulfate Er] Other (See Comments)   Shock        Medication List     STOP taking these medications    ALPRAZolam 0.5 MG tablet Commonly known as: XANAX   diclofenac Sodium 1 % Gel Commonly known as: Voltaren Arthritis Pain   traMADol 50 MG tablet Commonly known as: Ultram       TAKE these medications    Accu-Chek Guide test strip Generic drug: glucose blood Check blood sugar 2 times per day while on steroids before breakfast and dinner   Accu-Chek Softclix Lancets lancets Use to check blood sugar before breakfast and before dinner while on steroids   acetaminophen 325 MG tablet Commonly known as: Tylenol Take 2 tablets (650 mg total) by mouth every 6 (six) hours as needed.   aminocaproic acid 500 MG tablet Commonly known as: Amicar Take 2 tablets (1,000 mg total) by mouth every 12 (twelve) hours.   amLODipine 10 MG tablet Commonly known as: NORVASC Take 1 tablet (10 mg total) by mouth daily.   ceFAZolin IVPB Commonly known as: ANCEF Inject 2 g into the vein every 8 (eight) hours. Indication:  MSSA R-foot  osteo First Dose: Yes Last Day of Therapy:  08/25/23 Labs - Once weekly:  CBC/D and BMP, Labs - Once weekly: ESR and CRP Method of administration: IV Push Method of administration may be changed at the discretion of home infusion pharmacist based upon assessment of the patient and/or caregiver's ability to self-administer the medication ordered.   citalopram 20 MG tablet Commonly known as: CeleXA Take 1 tablet (20 mg total) by mouth daily.   gabapentin 100 MG capsule Commonly known as: Neurontin Take 1 capsule (100 mg total) by mouth at bedtime.   lisinopril 20 MG tablet Commonly known as: ZESTRIL Take 1 tablet (20 mg total) by mouth daily.   methocarbamol 750 MG tablet Commonly known as: Robaxin-750 Take 1 tablet (750 mg total) by mouth 3 (three) times daily for 10 days.   Oxycodone HCl 10 MG Tabs Take 1 tablet (10 mg total) by mouth every 6 (six) hours as needed for up to 7 days for severe pain (pain score 7-10).   pantoprazole 40 MG tablet Commonly known as: PROTONIX TAKE 1 TABLET(40 MG) BY MOUTH TWICE DAILY What changed: See the new instructions.   ProAir HFA 108 (90 Base) MCG/ACT inhaler Generic drug: albuterol INHALE 1 TO 2 PUFFS INTO THE LUNGS EVERY 6 HOURS AS NEEDED FOR WHEEZING OR SHORTNESS OF BREATH   Senna-S 8.6-50 MG tablet Generic drug: senna-docusate Take 1 tablet by mouth at bedtime.   traZODone 50 MG tablet Commonly known as:  DESYREL Take 1 tablet (50 mg total) by mouth at bedtime as needed for sleep (if).               Durable Medical Equipment  (From admission, onward)           Start     Ordered   07/30/23 1148  For home use only DME Other see comment  Once       Comments: Slide board  Question:  Length of Need  Answer:  Lifetime   07/30/23 1147   07/28/23 1210  For home use only DME Hospital bed  Once       Comments: There is an immediate need for a change in position and the beneficiary can operate the controls independently and  make the adjustments.  Question Answer Comment  Length of Need 12 Months   Patient has (list medical condition): perioperative hardware infection of R foot s/p hardware removal and I&D   The above medical condition requires: Patient requires the ability to reposition frequently   Head must be elevated greater than: 45 degrees   Bed type Semi-electric      07/28/23 1210   07/24/23 1230  For home use only DME Bedside commode  Once       Comments: Drop arm commode  Question:  Patient needs a bedside commode to treat with the following condition  Answer:  Osteomyelitis (HCC)   07/24/23 1230              Discharge Care Instructions  (From admission, onward)           Start     Ordered   07/31/23 0000  Discharge wound care:       Comments: Keep bandage clean and dry until appointment with plastic surgery next week to change dressing   07/31/23 1251   07/30/23 0000  Discharge wound care:       Comments: Keep dressing clean and dry. Dressing will be changed at plastic surgery clinic next week, clinic will call to schedule appointment.   07/30/23 1159   07/17/23 0000  Change dressing on IV access line weekly and PRN  (Home infusion instructions - [facility] )        07/17/23 1309           isposition and follow-up:   Peggy House was discharged from [facility] in Stable condition.  At the hospital follow up visit please address:   1.  Follow-up:             Peri implant MSSA R ankle infection: ensure patient has been receiving outpatient antibiotic therapy, has wound care with plastic surgery established, HH PT/OT have been coming to her home. Evaluate for signs of worsening infection. Also ensure she has outpatient ID follow up    2.  Labs / imaging needed at time of follow-up: None   3.  Pending labs/ test needing follow-up: None   4.  Medication Changes             Started: Oxycodone 10mg  q6h for 5 days, Robaxin 750mg  TID for  10 days             Stopped:             Changed:             Abx -    Cefazolin (OPAT)        End Date: 08/25/23   Follow-up Appointments:  Follow-up Information       Dillingham, Lindaann Requena, DO Follow up in 10 day(s).   Specialty: Plastic Surgery Contact information: [address] [place] [address] [phone]                        [facility] on 4/24 at 9:45 with [facility] Course by problem list:  Peri-implant MSSA infection of R ankle Patient s/p right bimalleolar ORIF on 04/24/23 presented with R ankle pain with purulent drainage and dehiscence from the lateral incision site. Vancomycin and ceftriaxone were initiated. Orthopedic surgery was consulted, who performed removal of hardware and I&D on 4/1 and 4/7. Antibiotics were narrowed to cefazolin based on wound cultures positive for MSSE. Excision of the wound and wound vac change was performed by plastic surgery on 4/14. Physical and occupational therapy teams recommended discharge home with HHPT/OT. A hospital bed was also ordered and delivered to her home.    Patient was discharged home on 4/17 with outpatient plastic surgery follow up for wound vac change planned for next week. OPAT has been established who will continue cefazolin with end date 08/25/23.  Dr. Alwin Baars with ID will receive labs and  communication with regard to antibiotic.   Acute on chronic anemia Hereditary hemorrhagic telangiectasias Patient has a history of HHT with frequent episodes of mucosal bleeding. Patient received pRBC transfusions x 3 during admission for hemoglobin below goal of 8 established by hematology. Patient's home Amicar was continued during admission. Risks of bleeding vs post-op DVT were considered and it was decided to continue DVT ppx with lovenox during admission. No significant bleeding occurred.    HTN: home amlodipine, lisinopril were continued GERD: home pantoprazole was continued Depression: home citalopram,  hydroxyzine were continued Insomnia: home trazodone was continued    Discharge Subjective: Patient was feeling very well this morning.  She continues to have some pain in her right foot, though it is tolerable with p.o. oxycodone.  She feels ready to go home today.  Discussed discharge planning including home health PT/OT, outpatient antibiotic therapy, DME including hospital bed, and wound care with follow-up in plastic surgery clinic next week.  All questions answered   Discharge Exam:   BP 139/72 (BP Location: Left Arm)   Pulse (!) 50   Temp 98.1 F (36.7 C) (Oral)   Resp 18   Ht 5\' 4"  (1.626 m)   Wt 106.1 kg   SpO2 99%   BMI 40.17 kg/m  Constitutional: well-appearing, in no acute distress Neurological: alert & oriented x 3 Skin: warm and dry Extremities: R foot in clean and dry bandage with wound vac in place   Pertinent Labs, Studies, and Procedures:      Latest Ref Rng & Units 07/29/2023    4:01 AM 07/28/2023    4:50 AM 07/23/2023    2:33 AM  CBC  WBC 4.0 - 10.5 K/uL 9.7  8.4  15.5   Hemoglobin 12.0 - 15.0 g/dL 8.3  7.7  7.9   Hematocrit 36.0 - 46.0 % 28.3  26.4  27.5   Platelets 150 - 400 K/uL 458  443  534           Latest Ref Rng & Units 07/29/2023    4:01 AM 07/28/2023    4:50 AM 07/22/2023    2:09 AM  CMP  Glucose 70 - 99 mg/dL 962  952  841   BUN 8 - 23 mg/dL  12  12  14    Creatinine 0.44 - 1.00 mg/dL 1.32  4.40  1.02   Sodium 135 - 145 mmol/L 140  140  137   Potassium 3.5 - 5.1 mmol/L 4.2  3.8  4.3   Chloride 98 - 111 mmol/L 109  108  101   CO2 22 - 32 mmol/L 23  25  24    Calcium 8.9 - 10.3 mg/dL 8.6  8.3  8.5     DG Ankle Complete Right Result Date: 07/13/2023 CLINICAL DATA:  Right ankle wound dehiscence and drainage. Recent surgery in early January. Concern for infection. EXAM: RIGHT ANKLE - COMPLETE 3+ VIEW COMPARISON:  Right ankle x-rays dated April 24, 2023. FINDINGS: Postsurgical changes related to bimalleolar ORIF with syndesmotic fixation. No evidence  of hardware failure or loosening. Mild tibiotalar and midfoot degenerative changes. Diffuse soft tissue swelling. Superficial subcutaneous emphysema along the lateral hindfoot. IMPRESSION: 1. Diffuse soft tissue swelling with superficial subcutaneous emphysema along the lateral hindfoot, presumably related to wound dehiscence. 2. Postsurgical changes without hardware complication. No radiographic evidence of osteomyelitis. Electronically Signed   By: Aleta Anda M.D.   On: 07/13/2023 17:04    Signed: Jayson Michael, MD PGY-1 07/30/2023, 11:30 AM   Pager: [phone]

## 2023-07-31 NOTE — TOC Transition Note (Signed)
 Transition of Care [facility]) - Discharge Note   Patient Details  Name: Peggy House MRN: [id number] Date of Birth: 30-Jul-1960  [facility] of [facility]) CM/SW Contact:  Alisa App, RN Phone Number: 07/31/2023, 10:54 AM   Clinical Narrative:    Patient will DC to: home Anticipated DC date: 07/31/2023 Family notified: yes Transport by: car  Per MD patient ready for DC today .RN, patient, patient's family, Aaron/[facility] and [name]/[facility] notified of DC.[facility]. Approval received for DME home wound vac per Sherrlyn Dolores / [employer]. Equipment delivered to bedside by NCM. Plastics provider to manage wound vac dressing changes Pt without RX med concerns.  Friend to provide transportation to home and assist with care. Post hospital f/u noted on AVS.  RNCM will sign off for now as intervention is no longer needed. Please consult us  again if new needs arise.    Final next level of care: Home w Home Health Services Barriers to Discharge: No Barriers Identified   Patient Goals and CMS Choice     Choice offered to / list presented to : Patient      Discharge Placement                       Discharge Plan and Services Additional resources added to the After Visit Summary for     Discharge Planning Services: CM Consult            DME Arranged: Hospital bed, Bedside commode DME Agency: Other - Comment ([facility]) Date DME Agency Contacted: 07/24/23 Time DME Agency Contacted: [phone] Representative spoke with at DME Agency: Gladys Lamp HH Arranged: PT, RN HH Agency: [facility] Date HH Agency Contacted: 07/17/23 Time HH Agency Contacted: [phone] Representative spoke with at [facility] Agency: [name]  Social Drivers of Health (SDOH) Interventions SDOH Screenings   Food Insecurity: No Food Insecurity (07/14/2023)  Housing: Low Risk  (07/14/2023)  Transportation Needs: No Transportation Needs (07/14/2023)  Utilities: Not At Risk (07/14/2023)   Depression (PHQ2-9): High Risk (07/04/2023)  Tobacco Use: High Risk (07/28/2023)     Readmission Risk Interventions    04/18/2023    3:07 PM 04/15/2023    2:22 PM  Readmission Risk Prevention Plan  Transportation Screening Complete Complete  PCP or Specialist Appt within 5-7 Days  Complete  PCP or Specialist Appt within 3-5 Days Complete   Home Care Screening  Complete  Medication Review (RN CM)  Complete  HRI or Home Care Consult Complete   Social Work Consult for Recovery Care Planning/Counseling Complete   Palliative Care Screening Complete   Medication Review Oceanographer) Complete

## 2023-07-31 NOTE — Progress Notes (Signed)
 Occupational Therapy Treatment Patient Details Name: Peggy House MRN: [id number] DOB: 02/09/1961 Today's Date: 07/31/2023   History of present illness Peggy House is a 63 y.o. female admitted 3/30 with dehiscence of her incisions from right ankle ORIF in Jan.  Pt has been weight bearing at times on right foot and developed drainage and cellulitis.  I&D right ankle with wound VAC placement 4/1.   Repeat I&D 4/7; s/p Excision of right ankle wound 2 x 7 cm skin and soft tissue with myriad 500 mg and 5 x 5 cm sheet and VAC placement on 4/14 by Plastic Surgery; PMH of HHT, chronic anemia, HTN, T2DM, hx of GI bleed, tobacco use,  Right ankle ORIF on 04/24/23 with SNF rehab stay through mid Feb.   OT comments  Pt progressing well towards goals. AE education reviewed, pt demo good skill carryover from previous session. Discussed use of bariatric BSC upon d/c and lateral leans to complete toileting. Continue to recommend HHOT to optimize independence levels. Will continue to follow acutely.       If plan is discharge home, recommend the following:  A lot of help with bathing/dressing/bathroom;Assistance with cooking/housework;A little help with walking and/or transfers   Equipment Recommendations  Other (comment) (Drop arm bariatric bsc)    Recommendations for Other Services      Precautions / Restrictions Precautions Precautions: Fall Recall of Precautions/Restrictions: Intact Precaution/Restrictions Comments: Wound vac Restrictions Weight Bearing Restrictions Per Provider Order: Yes RLE Weight Bearing Per Provider Order: Non weight bearing       Mobility Bed Mobility Overal bed mobility: Modified Independent       Supine to sit: Modified independent (Device/Increase time)          Transfers Overall transfer level: Needs assistance Equipment used: None Transfers: Bed to chair/wheelchair/BSC            Lateral/Scoot Transfers: Contact guard  assist General transfer comment: set up assist for placement of recliner     Balance Overall balance assessment: Independent Sitting-balance support: Feet supported Sitting balance-Leahy Scale: Good       ADL either performed or assessed with clinical judgement   ADL Overall ADL's : Needs assistance/impaired     Grooming: Wash/dry face;Set up;Sitting           Upper Body Dressing : Set up;Sitting   Lower Body Dressing: Set up;Sitting/lateral leans;With adaptive equipment Lower Body Dressing Details (indicate cue type and reason): Pt don sock with sock aid, cues for use Toilet Transfer: Set up Toilet Transfer Details (indicate cue type and reason): Able to lean to place bed pan while seated EOB, would be set up assist with bariatric bsc Toileting- Clothing Manipulation and Hygiene: Total assistance;Sitting/lateral lean Toileting - Clothing Manipulation Details (indicate cue type and reason): Total assist to wipe while in lean     Functional mobility during ADLs: Set up General ADL Comments: AE education reviewed    Extremity/Trunk Assessment Upper Extremity Assessment Upper Extremity Assessment: Overall WFL for tasks assessed   Lower Extremity Assessment Lower Extremity Assessment: Defer to PT evaluation        Vision   Vision Assessment?: No apparent visual deficits         Communication Communication Communication: No apparent difficulties   Cognition Arousal: Alert Behavior During Therapy: WFL for tasks assessed/performed Cognition: No apparent impairments     Following commands: Intact        Cueing   Cueing Techniques: Verbal cues, Visual cues  General Comments RN changed wound vac during session    Pertinent Vitals/ Pain       Pain Assessment Pain Assessment: Faces Faces Pain Scale: Hurts a little bit Pain Location: Right LE Pain Descriptors / Indicators: Discomfort, Grimacing Pain Intervention(s): Monitored during session,  Repositioned   Frequency  Min 2X/week        Progress Toward Goals  OT Goals(current goals can now be found in the care plan section)  Progress towards OT goals: Progressing toward goals  Acute Rehab OT Goals Patient Stated Goal: To go home OT Goal Formulation: With patient Time For Goal Achievement: 08/06/23 Potential to Achieve Goals: Good ADL Goals Pt Will Perform Lower Body Dressing: sitting/lateral leans;with mod assist;with adaptive equipment Pt Will Transfer to Toilet: with supervision;with transfer board Pt Will Perform Toileting - Clothing Manipulation and hygiene: sitting/lateral leans;with supervision  Plan         AM-PAC OT "6 Clicks" Daily Activity     Outcome Measure   Help from another person eating meals?: None Help from another person taking care of personal grooming?: A Little Help from another person toileting, which includes using toliet, bedpan, or urinal?: Total Help from another person bathing (including washing, rinsing, drying)?: A Lot Help from another person to put on and taking off regular upper body clothing?: A Little Help from another person to put on and taking off regular lower body clothing?: A Lot 6 Click Score: 15    End of Session Equipment Utilized During Treatment: Other (comment) (sock aid)  OT Visit Diagnosis: Unsteadiness on feet (R26.81);Other abnormalities of gait and mobility (R26.89);Repeated falls (R29.6)   Activity Tolerance Patient tolerated treatment well   Patient Left in chair;with call bell/phone within reach;with chair alarm set   Nurse Communication Mobility status        Time: 1610-9604 OT Time Calculation (min): 29 min  Charges: OT General Charges $OT Visit: 1 Visit OT Treatments $Self Care/Home Management : 23-37 mins  Delmer Ferraris, OT  Acute Rehabilitation Services Office [phone] Secure chat preferred   Mickael Alamo 07/31/2023, 2:07 PM

## 2023-07-31 NOTE — Progress Notes (Signed)
 DISCHARGE NOTE HOME Peggy House to be discharged Home per MD order. Discussed prescriptions and follow up appointments with the patient. Prescriptions given to patient; medication list explained in detail. Patient verbalized understanding.  Skin clean, dry and intact without evidence of skin break down, no evidence of skin tears noted. IV catheter discontinued intact. Site without signs and symptoms of complications. Dressing and pressure applied. Pt denies pain at the site currently. No complaints noted.  See LDA for lines and wounds discharging with  PICC line for antibiotics and right leg incision and wound vac  An After Visit Summary (AVS) was printed and given to the patient. Patient escorted via wheelchair, and discharged home via private auto.  Tonda Francisco, RN

## 2023-08-01 ENCOUNTER — Telehealth: Payer: Self-pay | Admitting: Hematology

## 2023-08-01 DIAGNOSIS — E785 Hyperlipidemia, unspecified: Secondary | ICD-10-CM | POA: Diagnosis not present

## 2023-08-01 DIAGNOSIS — T8141XD Infection following a procedure, superficial incisional surgical site, subsequent encounter: Secondary | ICD-10-CM | POA: Diagnosis not present

## 2023-08-01 DIAGNOSIS — G4700 Insomnia, unspecified: Secondary | ICD-10-CM | POA: Diagnosis not present

## 2023-08-01 DIAGNOSIS — M159 Polyosteoarthritis, unspecified: Secondary | ICD-10-CM | POA: Diagnosis not present

## 2023-08-01 DIAGNOSIS — L03115 Cellulitis of right lower limb: Secondary | ICD-10-CM | POA: Diagnosis not present

## 2023-08-01 DIAGNOSIS — D509 Iron deficiency anemia, unspecified: Secondary | ICD-10-CM | POA: Diagnosis not present

## 2023-08-01 DIAGNOSIS — F339 Major depressive disorder, recurrent, unspecified: Secondary | ICD-10-CM | POA: Diagnosis not present

## 2023-08-01 DIAGNOSIS — S82841D Displaced bimalleolar fracture of right lower leg, subsequent encounter for closed fracture with routine healing: Secondary | ICD-10-CM | POA: Diagnosis not present

## 2023-08-01 DIAGNOSIS — E1151 Type 2 diabetes mellitus with diabetic peripheral angiopathy without gangrene: Secondary | ICD-10-CM | POA: Diagnosis not present

## 2023-08-01 DIAGNOSIS — F17210 Nicotine dependence, cigarettes, uncomplicated: Secondary | ICD-10-CM | POA: Diagnosis not present

## 2023-08-01 DIAGNOSIS — I700 Atherosclerosis of aorta: Secondary | ICD-10-CM | POA: Diagnosis not present

## 2023-08-01 DIAGNOSIS — E876 Hypokalemia: Secondary | ICD-10-CM | POA: Diagnosis not present

## 2023-08-01 DIAGNOSIS — E66813 Obesity, class 3: Secondary | ICD-10-CM | POA: Diagnosis not present

## 2023-08-01 DIAGNOSIS — D696 Thrombocytopenia, unspecified: Secondary | ICD-10-CM | POA: Diagnosis not present

## 2023-08-01 DIAGNOSIS — R32 Unspecified urinary incontinence: Secondary | ICD-10-CM | POA: Diagnosis not present

## 2023-08-01 DIAGNOSIS — Z90710 Acquired absence of both cervix and uterus: Secondary | ICD-10-CM | POA: Diagnosis not present

## 2023-08-01 DIAGNOSIS — Z556 Problems related to health literacy: Secondary | ICD-10-CM | POA: Diagnosis not present

## 2023-08-01 DIAGNOSIS — K219 Gastro-esophageal reflux disease without esophagitis: Secondary | ICD-10-CM | POA: Diagnosis not present

## 2023-08-01 DIAGNOSIS — T8131XA Disruption of external operation (surgical) wound, not elsewhere classified, initial encounter: Secondary | ICD-10-CM | POA: Diagnosis not present

## 2023-08-01 DIAGNOSIS — I10 Essential (primary) hypertension: Secondary | ICD-10-CM | POA: Diagnosis not present

## 2023-08-01 DIAGNOSIS — F411 Generalized anxiety disorder: Secondary | ICD-10-CM | POA: Diagnosis not present

## 2023-08-01 DIAGNOSIS — I2721 Secondary pulmonary arterial hypertension: Secondary | ICD-10-CM | POA: Diagnosis not present

## 2023-08-01 DIAGNOSIS — Z86718 Personal history of other venous thrombosis and embolism: Secondary | ICD-10-CM | POA: Diagnosis not present

## 2023-08-01 NOTE — Anesthesia Postprocedure Evaluation (Signed)
 Anesthesia Post Note  Patient: Peggy House  Procedure(s) Performed: IRRIGATION AND DEBRIDEMENT WOUND (Right: Ankle) APPLICATION, SKIN SUBSTITUTE (Right: Ankle) APPLICATION, WOUND VAC (Right: Ankle)     Patient location during evaluation: PACU Anesthesia Type: General Level of consciousness: awake and alert Pain management: pain level controlled Vital Signs Assessment: post-procedure vital signs reviewed and stable Respiratory status: spontaneous breathing, nonlabored ventilation and respiratory function stable Cardiovascular status: blood pressure returned to baseline and stable Postop Assessment: no apparent nausea or vomiting Anesthetic complications: no   No notable events documented.               Domenico Achord

## 2023-08-02 ENCOUNTER — Inpatient Hospital Stay

## 2023-08-04 DIAGNOSIS — M869 Osteomyelitis, unspecified: Secondary | ICD-10-CM | POA: Diagnosis not present

## 2023-08-05 ENCOUNTER — Ambulatory Visit (INDEPENDENT_AMBULATORY_CARE_PROVIDER_SITE_OTHER): Admitting: Student

## 2023-08-05 ENCOUNTER — Telehealth: Payer: Self-pay

## 2023-08-05 ENCOUNTER — Telehealth: Payer: Self-pay | Admitting: Student

## 2023-08-05 ENCOUNTER — Ambulatory Visit: Admitting: Student

## 2023-08-05 ENCOUNTER — Telehealth: Payer: Self-pay | Admitting: *Deleted

## 2023-08-05 DIAGNOSIS — T8130XA Disruption of wound, unspecified, initial encounter: Secondary | ICD-10-CM

## 2023-08-05 NOTE — Progress Notes (Signed)
 Referring Provider Nooruddin, Saad, MD [address] [place],  [place] [address]   CC: No chief complaint on file.     Peggy House is an 63 y.o. female.  HPI: Patient is a 63 year old female with history of a right ankle wound.  She most recently underwent excision of the right ankle wound and placement of wound VAC with Dr. Orin House on 07/28/2023.  She is about 1 week out from her procedure and she presents to the clinic today for postprocedural follow-up and a wound VAC change.  Today, patient presents with her friend at bedside.  Patient reports she has been doing well.  She does not report any issues with the wound VAC.  Denies any fevers or chills.  States that not much has been coming into the canister.  Patient also states that she has not seen Dr. Adrain House yet with orthopedics since being discharged.  Review of Systems General: Denies any fevers or chills.  Physical Exam    07/31/2023    8:45 AM 07/31/2023    4:36 AM 07/30/2023    8:28 PM  Vitals with BMI  Systolic 154 157 213  Diastolic 73 71 56  Pulse 51 54 57    General:  No acute distress,  Alert and oriented, Non-Toxic, Normal speech and affect On exam, patient is sitting upright in no acute distress.  Wound VAC and dressings were removed.  To the right lateral ankle, there is myriad throughout the wound.  It does not appear to be incorporated.  There is no surrounding erythema or drainage.  Remainder of the incision appears to be intact with nylon sutures.  To the medial ankle, there does appear to be some dehiscence of the wound with exudate noted throughout.  There is no surrounding erythema or drainage.  There are no signs of infection on exam.  Assessment/Plan  Wound dehiscence   Discussed with patient that to the lateral wound, it appears myriad has not yet incorporated.  We did place an Adaptic, K-Y jelly and placed new wound VAC sponge over the wound.  Wound VAC was then set to 125 mmHg with adequate  seal.  We placed Vashe soaked gauze over the medial wound for several minutes.  This was removed and Xeroform and a Mepilex border dressing were placed over the medial wound.  For the lateral wound, I recommended that patient keep wound VAC in place and come back to our clinic next week to have it changed.  As for the medial wound, recommend that she place a Vashe soaked gauze to the wound, remove these and then apply Xeroform and a dressing over the top.  Patient thinks that she may have home health care coming in at this time, but is not sure which agency they are with.  We will try to find that out and send over wound care instructions if she is receiving home health care.    I did tell the patient and her friend that in the meantime, if they cannot get Xeroform, they can apply Vaseline and a nonstick gauze and a dressing over the wound to the medial ankle daily.  I do hold them to just keep the wound VAC in place and have us  change it next week.  Patient and her friend expressed understanding.  Orders have been written out for wound care dressings.  Our office is going to see if patient has home health care.  If not, patient will have to come here for  wound VAC changes for the lateral wound and do daily dressing changes to the medial wound.  I also recommended that patient follow-up with her orthopedic surgeon, Dr. Adrain House.  Patient expressed understanding.  Patient to follow back up next week.  I instructed her to call in the meantime she has any questions or concerns about anything.  Pictures were obtained of the patient and placed in the chart with the patient's or guardian's permission.   Peggy House 08/05/2023, 9:19 AM

## 2023-08-05 NOTE — Telephone Encounter (Signed)
 Copied from CRM [id number]. Topic: Clinical - Prescription Issue >> Aug 05, 2023 11:00 AM Arlie Benedict B wrote: Reason for CRM: Alyse July [id number]: [facility]  Staff has informed Ms. Madison that the patient is not taking her 3am medication as directed.

## 2023-08-05 NOTE — Telephone Encounter (Signed)
 Spoke with Thurston Flow with [facility] regarding home health wound care. He stated he added it to her order. He has access to Epic and will pull the note. I faxed over the wound care instructions for both the medial RLE wound and lateral RLE wound vac changes.  Receipt confirmed.  [facility]:  Phone: [phone] Fax: [phone]

## 2023-08-05 NOTE — Telephone Encounter (Signed)
 Attempted to contact patient in reference to message below, but no answer.  Left detailed message informing the patient that she has an appointment  on 08/07/23 at 9:45 am with Dr. Malen Scudder.  She will need to discuss with the doctor at that visit about receiving a letter for proof of disability for housing.  Copied from CRM [id number]. Topic: General - Other >> Aug 05, 2023  2:34 PM Peggy House wrote: Reason for CRM: The patient would like to discuss with her provider about a letter for proof of disability she needs from her PCP to qualify for her recent application to the [employer] of [place].  Callback #[phone]

## 2023-08-06 NOTE — Assessment & Plan Note (Signed)
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 -she has required multiple blood transfusions and APCs since 2019. 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 -She had multiple prolonged hospital stay due to severe GI bleeding.   -s/p IR embolization of gastric artery branch vessels on 12/10/17, cauterization of stomach for severe GI bleed in 11/2018, and epinephrine injection for bleeding ulcer on 11/03/20. -She has required frequent blood transfusion, IV iron and bevacizumab -She does respond to treatment, anemia improved with IV iron and bevacizumab, but she is not compliant with her appointments.

## 2023-08-07 ENCOUNTER — Encounter: Payer: Self-pay | Admitting: Hematology

## 2023-08-07 ENCOUNTER — Inpatient Hospital Stay

## 2023-08-07 ENCOUNTER — Inpatient Hospital Stay: Attending: Hematology | Admitting: Hematology

## 2023-08-07 ENCOUNTER — Encounter: Admitting: Internal Medicine

## 2023-08-07 ENCOUNTER — Other Ambulatory Visit: Payer: Self-pay

## 2023-08-07 VITALS — BP 159/69 | HR 62 | Resp 18

## 2023-08-07 VITALS — BP 124/64 | HR 70 | Temp 98.4°F | Resp 21 | Ht 64.0 in

## 2023-08-07 DIAGNOSIS — I780 Hereditary hemorrhagic telangiectasia: Secondary | ICD-10-CM

## 2023-08-07 DIAGNOSIS — D500 Iron deficiency anemia secondary to blood loss (chronic): Secondary | ICD-10-CM

## 2023-08-07 DIAGNOSIS — M86271 Subacute osteomyelitis, right ankle and foot: Secondary | ICD-10-CM | POA: Diagnosis not present

## 2023-08-07 DIAGNOSIS — Z95828 Presence of other vascular implants and grafts: Secondary | ICD-10-CM

## 2023-08-07 DIAGNOSIS — T8130XA Disruption of wound, unspecified, initial encounter: Secondary | ICD-10-CM | POA: Diagnosis not present

## 2023-08-07 DIAGNOSIS — T84629A Infection and inflammatory reaction due to internal fixation device of unspecified bone of leg, initial encounter: Secondary | ICD-10-CM | POA: Diagnosis not present

## 2023-08-07 LAB — CMP (CANCER CENTER ONLY)
ALT: <5 U/L (ref 0–44)
AST: 9 U/L — ABNORMAL LOW (ref 15–41)
Albumin: 3.7 g/dL (ref 3.5–5.0)
Alkaline Phosphatase: 73 U/L (ref 38–126)
Anion gap: 4 — ABNORMAL LOW (ref 5–15)
BUN: 6 mg/dL — ABNORMAL LOW (ref 8–23)
CO2: 29 mmol/L (ref 22–32)
Calcium: 8.7 mg/dL — ABNORMAL LOW (ref 8.9–10.3)
Chloride: 108 mmol/L (ref 98–111)
Creatinine: 0.68 mg/dL (ref 0.44–1.00)
GFR, Estimated: >60 mL/min (ref >60)
Glucose, Bld: 98 mg/dL (ref 70–99)
Potassium: 3.4 mmol/L — ABNORMAL LOW (ref 3.5–5.1)
Sodium: 141 mmol/L (ref 135–145)
Total Bilirubin: 0.3 mg/dL (ref 0.0–1.2)
Total Protein: 7.2 g/dL (ref 6.5–8.1)

## 2023-08-07 LAB — CBC WITH DIFFERENTIAL (CANCER CENTER ONLY)
Abs Immature Granulocytes: 0.03 10*3/uL (ref 0.00–0.07)
Basophils Absolute: 0 10*3/uL (ref 0.0–0.1)
Basophils Relative: 0 %
Eosinophils Absolute: 0.1 10*3/uL (ref 0.0–0.5)
Eosinophils Relative: 2 %
HCT: 25.1 % — ABNORMAL LOW (ref 36.0–46.0)
Hemoglobin: 7.5 g/dL — ABNORMAL LOW (ref 12.0–15.0)
Immature Granulocytes: 0 %
Lymphocytes Relative: 13 %
Lymphs Abs: 1 10*3/uL (ref 0.7–4.0)
MCH: 24.8 pg — ABNORMAL LOW (ref 26.0–34.0)
MCHC: 29.9 g/dL — ABNORMAL LOW (ref 30.0–36.0)
MCV: 82.8 fL (ref 80.0–100.0)
Monocytes Absolute: 0.6 10*3/uL (ref 0.1–1.0)
Monocytes Relative: 7 %
Neutro Abs: 6.2 10*3/uL (ref 1.7–7.7)
Neutrophils Relative %: 78 %
Platelet Count: 277 10*3/uL (ref 150–400)
RBC: 3.03 MIL/uL — ABNORMAL LOW (ref 3.87–5.11)
RDW: 20.2 % — ABNORMAL HIGH (ref 11.5–15.5)
WBC Count: 8 10*3/uL (ref 4.0–10.5)
nRBC: 0 % (ref 0.0–0.2)

## 2023-08-07 LAB — SAMPLE TO BLOOD BANK

## 2023-08-07 LAB — PREPARE RBC (CROSSMATCH)

## 2023-08-07 MED ORDER — ACETAMINOPHEN 325 MG PO TABS
650.0000 mg | ORAL_TABLET | Freq: Once | ORAL | Status: AC
Start: 2023-08-07 — End: 2023-08-07
  Administered 2023-08-07: 650 mg via ORAL
  Filled 2023-08-07: qty 2

## 2023-08-07 MED ORDER — DIPHENHYDRAMINE HCL 25 MG PO CAPS
25.0000 mg | ORAL_CAPSULE | Freq: Once | ORAL | Status: AC
  Administered 2023-08-07: 25 mg via ORAL
  Filled 2023-08-07: qty 1

## 2023-08-07 MED ORDER — SODIUM CHLORIDE 0.9% FLUSH
10.0000 mL | Freq: Once | INTRAVENOUS | Status: AC
  Administered 2023-08-07: 10 mL

## 2023-08-07 MED ORDER — SODIUM CHLORIDE 0.9 % IV SOLN
250.0000 mg | Freq: Once | INTRAVENOUS | Status: AC
  Administered 2023-08-07: 250 mg via INTRAVENOUS
  Filled 2023-08-07: qty 20

## 2023-08-07 MED ORDER — SODIUM CHLORIDE 0.9 % IV SOLN: Freq: Once | INTRAVENOUS | Status: AC

## 2023-08-07 NOTE — Progress Notes (Signed)
 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 -she has required multiple blood transfusions and APCs since 2019. 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 -She had multiple prolonged hospital stay due to severe GI bleeding.   -s/p IR embolization of gastric artery branch vessels on 12/10/17, cauterization of stomach for severe GI bleed in 11/2018, and epinephrine  injection for bleeding ulcer on 11/03/20. -She has required frequent blood transfusion, IV iron  and bevacizumab  -She does respond to treatment, anemia improved with IV iron  and bevacizumab , but she is not compliant with her appointments.  Assessment & Plan Anemia Chronic anemia with hemoglobin at 7.5 g/dL. Recent blood transfusion and IV iron  therapy in hospital. Reports recent epistaxis, suggesting ongoing blood loss. Bevacizumab  administration deferred due to open wound, as it may impede healing. Management includes IV iron  and blood transfusion. - Administer IV iron  today - Schedule one unit blood transfusion for Saturday, contingent on availability - Schedule weekly IV iron  infusions for four weeks  Hereditary Hemorrhagic Telangiectasia (HHT) HHT with recent epistaxis contributing to anemia. Bevacizumab  treatment deferred due to open wound, which could be adversely affected by the medication. Management with IV iron  and blood transfusion is  planned.  Infection of surgical wound Post-surgical wound infection following ankle fracture repair. Managed with home IV antibiotics. No infectious disease specialist involved. Wound care ongoing with wound vacuum. - Continue current IV antibiotic regimen until May 12 - Consult orthopedic surgeon for pain management and further wound care advice  Fracture of right ankle Ankle fracture with surgical repair and subsequent infection. Pain management with oxycodone  and muscle relaxants, though not taken today. - Consult orthopedic surgeon for pain management and follow-up care  Plan - Lab reviewed, hemoglobin 7.5, will proceed IV iron  ferric gluconate 250 mg today and continue with today for the next few months -2 units of blood transfusion later this week, and I will schedule 1 unit every Saturday for next 4 weeks. - Due to her open wound of right ankle, will hold bevacizumab  for now. - Lab weekly, follow-up in 4 weeks.       Discussed the use of AI scribe software for clinical note transcription with the patient, who gave verbal consent to proceed.  History of Present Illness The patient, a 63 year old with a history of anemia and Hereditary Hemorrhagic Telangiectasia (HHT), presents for follow-up. She recently sustained an ankle fracture from a fall, which required two surgeries and subsequently developed an infection. She is currently being managed with a wound vac and antibiotics, which she has been on since her hospital discharge. She is also receiving home health care for IV antibiotic administration.  The patient reports ongoing pain in the ankle. She has been experiencing nosebleeds, which she attributes to a need for iron . She was previously receiving bevacizumab  to slow down bleeding, but this has been discontinued due to the open wound on her ankle.  The patient also has a  history of receiving blood transfusions and IV iron  for her anemia. She reports feeling like she needs more  iron . Her current hemoglobin level is 7.5.     All other systems were reviewed with the patient and are negative.  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    SURGICAL HISTORY: Past Surgical History:  Procedure Laterality Date   ABDOMINAL HYSTERECTOMY     APPLICATION OF WOUND VAC Right 07/28/2023   Procedure: APPLICATION, WOUND VAC;  Surgeon: Thornell Flirt, DO;  [facility]: [facility];  Service: Plastics;  Laterality: Right;   CARPAL TUNNEL RELEASE  05/13/2011   Procedure: CARPAL TUNNEL RELEASE;  Surgeon: Derald Flattery, MD;  Location: [facility];  Service: Orthopedics;  Laterality: Left;   COLONOSCOPY N/A 03/02/2020   Procedure: COLONOSCOPY;  Surgeon: Lanita Pitman, MD;  Location: [facility] END[facility];  Service: Endoscopy;  Laterality: N/A;   COLONOSCOPY WITH PROPOFOL  N/A 04/28/2014   Procedure: COLONOSCOPY WITH PROPOFOL ;  Surgeon: Brice Campi, MD;  Location: [facility] ENDOSCOPY;  Service: Endoscopy;  Laterality: N/A;   DG TOES*L*  2/10   rt   DILATION AND CURETTAGE OF UTERUS     ENTEROSCOPY N/A 10/17/2017   Procedure: ENTEROSCOPY;  Surgeon: Felecia Hopper, MD;  Location: [facility];  Service: Gastroenterology;  Laterality: N/A;   ESOPHAGOGASTRODUODENOSCOPY N/A 04/10/2014   Procedure: ESOPHAGOGASTRODUODENOSCOPY (EGD);  Surgeon: Yvetta Herbert, MD;  Location: [facility] ENDOSCOPY;  Service: Endoscopy;  Laterality: N/A;   ESOPHAGOGASTRODUODENOSCOPY N/A 05/10/2017   Procedure: ESOPHAGOGASTRODUODENOSCOPY (EGD);  Surgeon: Lanita Pitman, MD;  Location: [facility] ENDOSCOPY;  Service:  Endoscopy;  Laterality: N/A;   ESOPHAGOGASTRODUODENOSCOPY N/A 09/22/2017   Procedure: ESOPHAGOGASTRODUODENOSCOPY (EGD);  Surgeon: Ozell Blunt, MD;  Location: [facility] ENDOSCOPY;  Service: Endoscopy;  Laterality: N/A;  bedside   ESOPHAGOGASTRODUODENOSCOPY N/A 03/02/2020   Procedure: ESOPHAGOGASTRODUODENOSCOPY (EGD);  Surgeon: Lanita Pitman, MD;  Location: [facility];  Service: Endoscopy;  Laterality: N/A;   ESOPHAGOGASTRODUODENOSCOPY N/A 11/03/2020   Procedure: ESOPHAGOGASTRODUODENOSCOPY (EGD);  Surgeon: Baldo Bonds, MD;  Location: [facility] ENDOSCOPY;  Service: Endoscopy;  Laterality: N/A;   ESOPHAGOGASTRODUODENOSCOPY N/A 04/12/2022   Procedure: ESOPHAGOGASTRODUODENOSCOPY (EGD);  Surgeon: Ozell Blunt, MD;  Location: [facility];  Service: Gastroenterology;  Laterality: N/A;   ESOPHAGOGASTRODUODENOSCOPY N/A 05/27/2022   Procedure: ESOPHAGOGASTRODUODENOSCOPY (EGD);  Surgeon: Evangeline Hilts, MD;  Location: [facility];  Service: Gastroenterology;  Laterality: N/A;   ESOPHAGOGASTRODUODENOSCOPY N/A 09/27/2022   Procedure: ESOPHAGOGASTRODUODENOSCOPY (EGD);  Surgeon: Renaye Carp, DO;  Location: [facility] ENDOSCOPY;  Service: Gastroenterology;  Laterality: N/A;   ESOPHAGOGASTRODUODENOSCOPY (EGD) WITH PROPOFOL  N/A 04/27/2014   Procedure: ESOPHAGOGASTRODUODENOSCOPY (EGD) WITH PROPOFOL ;  Surgeon: Brice Campi, MD;  Location: [facility] ENDOSCOPY;  Service: Endoscopy;  Laterality: N/A;  possible apc   ESOPHAGOGASTRODUODENOSCOPY (EGD) WITH PROPOFOL  N/A 09/30/2017   Procedure: ESOPHAGOGASTRODUODENOSCOPY (EGD) WITH PROPOFOL ;  Surgeon: Genell Ken, MD;  Location: [facility] ENDOSCOPY;  Service: Gastroenterology;  Laterality: N/A;   ESOPHAGOGASTRODUODENOSCOPY (EGD) WITH PROPOFOL  N/A 10/01/2017   Procedure: ESOPHAGOGASTRODUODENOSCOPY (EGD) WITH PROPOFOL ;  Surgeon: Genell Ken, MD;  Location: [facility] ENDOSCOPY;  Service: Gastroenterology;  Laterality: N/A;  ESOPHAGOGASTRODUODENOSCOPY (EGD) WITH PROPOFOL  N/A 10/08/2017   Procedure:  ESOPHAGOGASTRODUODENOSCOPY (EGD) WITH PROPOFOL ;  Surgeon: Felecia Hopper, MD;  Location: [facility];  Service: Gastroenterology;  Laterality: N/A;   ESOPHAGOGASTRODUODENOSCOPY (EGD) WITH PROPOFOL  N/A 10/17/2017   Procedure: ESOPHAGOGASTRODUODENOSCOPY (EGD) WITH PROPOFOL ;  Surgeon: Felecia Hopper, MD;  Location: [facility];  Service: Gastroenterology;  Laterality: N/A;   ESOPHAGOGASTRODUODENOSCOPY (EGD) WITH PROPOFOL  N/A 10/19/2017   Procedure: ESOPHAGOGASTRODUODENOSCOPY (EGD) WITH PROPOFOL ;  Surgeon: Felecia Hopper, MD;  [facility];  Service: Gastroenterology;  Laterality: N/A;   ESOPHAGOGASTRODUODENOSCOPY (EGD) WITH PROPOFOL  N/A 12/04/2018   Procedure: ESOPHAGOGASTRODUODENOSCOPY (EGD) WITH PROPOFOL ;  Surgeon: Baldo Bonds, MD;  Location: [facility] END[facility];  Service: Endoscopy;  Laterality: N/A;   GIVENS CAPSULE STUDY N/A 10/02/2017   Procedure: GIVENS CAPSULE STUDY;  Surgeon: Genell Ken, MD;  Location: [facility] ENDOSCOPY;  Service: Gastroenterology;  Laterality: N/A;   GIVENS CAPSULE STUDY N/A 10/08/2017   Procedure: GIVENS CAPSULE STUDY;  Surgeon: Felecia Hopper, MD;  Location: [facility];  Service: Gastroenterology;  Laterality: N/A;  endoscopic placement of capsule   GIVENS CAPSULE STUDY N/A 03/02/2020   Procedure: GIVENS CAPSULE STUDY;  Surgeon: Lanita Pitman, MD;  Location: [facility];  Service: Endoscopy;  Laterality: N/A;   HARDWARE REMOVAL Right 07/15/2023   Procedure: REMOVAL, HARDWARE;  Surgeon: Gaylon Kea, MD;  Location: [facility];  Service: Orthopedics;  Laterality: Right;   HEMOSTASIS CLIP PLACEMENT  12/04/2018   Procedure: HEMOSTASIS CLIP PLACEMENT;  Surgeon: Baldo Bonds, MD;  Location: [facility];  Service: Endoscopy;;   HEMOSTASIS CLIP PLACEMENT  05/27/2022   Procedure: HEMOSTASIS CLIP PLACEMENT;  Surgeon: Evangeline Hilts, MD;  Location: [facility];  Service: Gastroenterology;;   HEMOSTASIS CLIP PLACEMENT  09/27/2022   Procedure: HEMOSTASIS CLIP  PLACEMENT;  Surgeon: Renaye Carp, DO;  Location: [facility] ENDOSCOPY;  Service: Gastroenterology;;   HEMOSTASIS CONTROL  05/27/2022   Procedure: HEMOSTASIS CONTROL;  Surgeon: Evangeline Hilts, MD;  Location[facility];  Service: Gastroenterology;;   HOT HEMOSTASIS N/A 04/27/2014   Procedure: HOT HEMOSTASIS (ARGON PLASMA COAGULATION/BICAP);  Surgeon: Brice Campi, MD;  Location: [facility] ENDOSCOPY;  Service: Endoscopy;  Laterality: N/A;   HOT HEMOSTASIS N/A 09/30/2017   Procedure: HOT HEMOSTASIS (ARGON PLASMA COAGULATION/BICAP);  Surgeon: Genell Ken, MD;  Location: [facility] ENDOSCOPY;  Service: Gastroenterology;  Laterality: N/A;   HOT HEMOSTASIS N/A 10/01/2017   Procedure: HOT HEMOSTASIS (ARGON PLASMA COAGULATION/BICAP);  Surgeon: Genell Ken, MD;  Location: [facility] ENDOSCOPY;  Service: Gastroenterology;  Laterality: N/A;   HOT HEMOSTASIS N/A 10/17/2017   Procedure: HOT HEMOSTASIS (ARGON PLASMA COAGULATION/BICAP);  Surgeon: Felecia Hopper, MD;  Location: [facility] ENDOSCOPY;  Service: Gastroenterology;  Laterality: N/A;   HOT HEMOSTASIS N/A 10/19/2017   Procedure: HOT HEMOSTASIS (ARGON PLASMA COAGULATION/BICAP);  Surgeon: Felecia Hopper, MD;  Location: [facility] ENDOSCOPY;  Service: Gastroenterology;  Laterality: N/A;   HOT HEMOSTASIS N/A 03/02/2020   Procedure: HOT HEMOSTASIS (ARGON PLASMA COAGULATION/BICAP);  Surgeon: Lanita Pitman, MD;  Location: [facility];  Service: Endoscopy;  Laterality: N/A;   HOT HEMOSTASIS N/A 04/12/2022   Procedure: HOT HEMOSTASIS (ARGON PLASMA COAGULATION/BICAP);  Surgeon: Ozell Blunt, MD;  Location: [facility];  Service: Gastroenterology;  Laterality: N/A;   INCISION AND DRAINAGE OF WOUND Right 07/21/2023   Procedure: IRRIGATION AND DEBRIDEMENT WOUND;  Surgeon: Gaylon Kea, MD;  Location: [facility];  Service: Orthopedics;  Laterality: Right;  RIGHT ANKLE WOUND I&D   INCISION AND DRAINAGE OF WOUND Right 07/28/2023   Procedure: IRRIGATION AND DEBRIDEMENT WOUND;  Surgeon: Thornell Flirt, DO;  Location: [facility];  Service: Plastics;  Laterality: Right;  rrigation and debridement of right ankle wound with placement of myriad and wound vac   IR IMAGING GUIDED PORT INSERTION  07/08/2018   IRRIGATION AND DEBRIDEMENT POSTERIOR HIP Right 07/15/2023   Procedure: IRRIGATION AND DEBRIDEMENT, OPEN FRACTURE;  Surgeon: Gaylon Kea, MD;  Location: [facility];  Service: Orthopedics;  Laterality: Right;   L shoulder Surgery  2011   ORIF ANKLE FRACTURE Right 04/24/2023   Procedure: OPEN REDUCTION INTERNAL FIXATION (ORIF) ANKLE FRACTURE;  Surgeon: Gaylon Kea, MD;  Location: [facility];  Service: Orthopedics;  Laterality: Right;   POLYPECTOMY  03/02/2020   Procedure: POLYPECTOMY;  Surgeon: Lanita Pitman, MD;  Location: [facility];  Service: Endoscopy;;   SCLEROTHERAPY  11/03/2020   Procedure: Daryle Eon;  Surgeon: Baldo Bonds, MD;  Location: [facility] ENDOSCOPY;  Service: Endoscopy;;   SUBMUCOSAL INJECTION  09/22/2017   Procedure: SUBMUCOSAL INJECTION;  Surgeon: Ozell Blunt, MD;  Location: [facility] ENDOSCOPY;  Service: Endoscopy;;   SUBMUCOSAL INJECTION  12/04/2018   Procedure: SUBMUCOSAL INJECTION;  Surgeon: Baldo Bonds, MD;  Location: [facility] END[facility];  Service: Endoscopy;;    I have reviewed the social history and family history with the patient and they are unchanged from previous note.  ALLERGIES:  is allergic to feraheme  [ferumoxytol ], nsaids, wasp venom, tomato, and iron  (ferrous sulfate ) [ferrous sulfate  er].  MEDICATIONS:  Current Outpatient Medications  Medication Sig Dispense Refill   Accu-Chek Softclix Lancets lancets Use to check blood sugar before breakfast and before dinner while on steroids (Patient taking differently: 1 each by Other route See admin instructions. Use to check blood sugar before breakfast and before dinner while on steroids) 100 each 1   acetaminophen  (TYLENOL ) 325 MG tablet Take 2 tablets (650 mg total) by mouth every 6 (six) hours as needed. 100 tablet 0    aminocaproic  acid (AMICAR ) 500 MG tablet Take 2 tablets (1,000 mg total) by mouth every 12 (twelve) hours. 120 tablet 3   amLODipine  (NORVASC ) 10 MG tablet Take 1 tablet (10 mg total) by mouth daily. 30 tablet 11   ceFAZolin  (ANCEF ) IVPB Inject 2 g into the vein every 8 (eight) hours. Indication:  MSSA R-foot osteo First Dose: Yes Last Day of Therapy:  08/25/23 Labs - Once weekly:  CBC/D and BMP, Labs - Once weekly: ESR and CRP Method of administration: IV Push Method of administration may be changed at the discretion of home infusion pharmacist based upon assessment of the patient and/or caregiver's ability to self-administer the medication ordered. 117 Units 0   citalopram  (CELEXA ) 20 MG tablet Take 1 tablet (20 mg total) by mouth daily. 90 tablet 3   gabapentin  (NEURONTIN ) 100 MG capsule Take 1 capsule (100 mg total) by mouth at bedtime. 90 capsule 3   glucose blood (ACCU-CHEK GUIDE) test strip Check blood sugar 2 times per day while on steroids before breakfast and dinner (Patient taking differently: 1 each by Other route See admin instructions. Check blood sugar 2 times per day while on steroids before breakfast and dinner) 50 each 3   lisinopril  (ZESTRIL ) 20 MG tablet Take 1 tablet (20 mg total) by mouth daily. 30 tablet 3   methocarbamol  (ROBAXIN -750) 750 MG tablet Take 1 tablet (750 mg total) by mouth 3 (three) times daily for 10 days. 30 tablet 0   pantoprazole  (PROTONIX ) 40 MG tablet TAKE 1 TABLET(40 MG) BY MOUTH TWICE DAILY (Patient taking differently: Take 40 mg by mouth daily.) 180 tablet 1  PROAIR  HFA 108 (90 Base) MCG/ACT inhaler INHALE 1 TO 2 PUFFS INTO THE LUNGS EVERY 6 HOURS AS NEEDED FOR WHEEZING OR SHORTNESS OF BREATH (Patient taking differently: Inhale 1-2 puffs into the lungs every 6 (six) hours as needed for wheezing or shortness of breath.) 8.5 g 0   senna-docusate (SENOKOT-S) 8.6-50 MG tablet Take 1 tablet by mouth at bedtime. 100 tablet 0   traZODone  (DESYREL ) 50 MG tablet  Take 1 tablet (50 mg total) by mouth at bedtime as needed for sleep (if). 30 tablet 0   No current facility-administered medications for this visit.   Facility-Administered Medications Ordered in Other Visits  Medication Dose Route Frequency Provider Last Rate Last Admin   diphenhydrAMINE  (BENADRYL ) 25 mg capsule            diphenhydrAMINE  (BENADRYL ) 25 mg capsule            phenylephrine  (NEO-SYNEPHRINE) 1 % nasal drops 2 drop  2 drop Left Nare Once Sonja , MD        PHYSICAL EXAMINATION: ECOG PERFORMANCE STATUS: 2 - Symptomatic, <50% confined to bed  Vitals:   08/07/23 1213  BP: 124/64  Pulse: 70  Resp: (!) 21  Temp: 98.4 F (36.9 C)  SpO2: 98%   Wt Readings from Last 3 Encounters:  07/28/23 234 lb (106.1 kg)  07/04/23 232 lb (105.2 kg)  06/10/23 234 lb 8 oz (106.4 kg)     GENERAL:alert, no distress and comfortable SKIN: skin color, texture, turgor are normal, no rashes or significant lesions EYES: normal, Conjunctiva are pink and non-injected, sclera clear NECK: supple, thyroid normal size, non-tender, without nodularity LYMPH:  no palpable lymphadenopathy in the cervical, axillary  LUNGS: clear to auscultation and percussion with normal breathing effort HEART: regular rate & rhythm and no murmurs and no lower extremity edema ABDOMEN:abdomen soft, non-tender and normal bowel sounds Musculoskeletal:no cyanosis of digits and no clubbing, right ankle and foot is wrapped with a wound VAC NEURO: alert & oriented x 3 with fluent speech, no focal motor/sensory deficits  Physical Exam    LABORATORY DATA:  I have reviewed the data as listed    Latest Ref Rng & Units 08/07/2023   11:33 AM 07/29/2023    4:01 AM 07/28/2023    4:50 AM  CBC  WBC 4.0 - 10.5 K/uL 8.0  9.7  8.4   Hemoglobin 12.0 - 15.0 g/dL 7.5  8.3  7.7   Hematocrit 36.0 - 46.0 % 25.1  28.3  26.4   Platelets 150 - 400 K/uL 277  458  443         Latest Ref Rng & Units 08/07/2023   11:33 AM 07/29/2023     4:01 AM 07/28/2023    4:50 AM  CMP  Glucose 70 - 99 mg/dL 98  161  096   BUN 8 - 23 mg/dL 6  12  12    Creatinine 0.44 - 1.00 mg/dL 0.45  4.09  8.11   Sodium 135 - 145 mmol/L 141  140  140   Potassium 3.5 - 5.1 mmol/L 3.4  4.2  3.8   Chloride 98 - 111 mmol/L 108  109  108   CO2 22 - 32 mmol/L 29  23  25    Calcium  8.9 - 10.3 mg/dL 8.7  8.6  8.3   Total Protein 6.5 - 8.1 g/dL 7.2     Total Bilirubin 0.0 - 1.2 mg/dL 0.3     Alkaline Phos 38 - 126 U/L 73  AST 15 - 41 U/L 9     ALT 0 - 44 U/L <5         RADIOGRAPHIC STUDIES: I have personally reviewed the radiological images as listed and agreed with the findings in the report. No results found.    Orders Placed This Encounter  Procedures   Ferritin    Standing Status:   Standing    Number of Occurrences:   10    Expiration Date:   08/06/2024   All questions were answered. The patient knows to call the clinic with any problems, questions or concerns. No barriers to learning was detected. The total time spent in the appointment was 25 minutes.     Sonja Van Vleck, MD 08/07/2023

## 2023-08-07 NOTE — Progress Notes (Signed)
 Verbal order given to administer 1 unit of PRBC from Dr. Maryalice Smaller spoke with Athena Bland in the BB type and screen order received patient will have 1 unit of PRBC transfused on 08/09/2023

## 2023-08-07 NOTE — Patient Instructions (Signed)
Sodium Ferric Gluconate Complex Injection What is this medication? SODIUM FERRIC GLUCONATE COMPLEX (SOE dee um FER ik GLOO koe nate KOM pleks) treats low levels of iron (iron deficiency anemia) in people with kidney disease. Iron is a mineral that plays an important role in making red blood cells, which carry oxygen from your lungs to the rest of your body. This medicine may be used for other purposes; ask your health care provider or pharmacist if you have questions. COMMON BRAND NAME(S): Ferrlecit, Nulecit What should I tell my care team before I take this medication? They need to know if you have any of the following conditions: Anemia that is not from iron deficiency High levels of iron in the blood An unusual or allergic reaction to iron, other medications, foods, dyes, or preservatives Pregnant or are trying to become pregnant Breast-feeding How should I use this medication? This medication is injected into a vein. It is given by your care team in a hospital or clinic setting. Talk to your care team about the use of this medication in children. While it may be prescribed for children as young as 6 years for selected conditions, precautions do apply. Overdosage: If you think you have taken too much of this medicine contact a poison control center or emergency room at once. NOTE: This medicine is only for you. Do not share this medicine with others. What if I miss a dose? It is important not to miss your dose. Call your care team if you are unable to keep an appointment. What may interact with this medication? Do not take this medication with any of the following: Deferasirox Deferoxamine Dimercaprol This medication may also interact with the following: Other iron products This list may not describe all possible interactions. Give your health care provider a list of all the medicines, herbs, non-prescription drugs, or dietary supplements you use. Also tell them if you smoke, drink  alcohol, or use illegal drugs. Some items may interact with your medicine. What should I watch for while using this medication? Your condition will be monitored carefully while you are receiving this medication. Visit your care team for regular checks on your progress. You may need blood work while you are taking this medication. What side effects may I notice from receiving this medication? Side effects that you should report to your care team as soon as possible: Allergic reactions--skin rash, itching, hives, swelling of the face, lips, tongue, or throat Low blood pressure--dizziness, feeling faint or lightheaded, blurry vision Shortness of breath Side effects that usually do not require medical attention (report to your care team if they continue or are bothersome): Flushing Headache Joint pain Muscle pain Nausea Pain, redness, or irritation at injection site This list may not describe all possible side effects. Call your doctor for medical advice about side effects. You may report side effects to FDA at 1-800-FDA-1088. Where should I keep my medication? This medication is given in a hospital or clinic and will not be stored at home. NOTE: This sheet is a summary. It may not cover all possible information. If you have questions about this medicine, talk to your doctor, pharmacist, or health care provider.  2024 Elsevier/Gold Standard (2020-08-25 00:00:00)

## 2023-08-08 ENCOUNTER — Telehealth: Payer: Self-pay | Admitting: Hematology

## 2023-08-08 NOTE — Telephone Encounter (Signed)
 Beauford Bounds cancelled Peggy House's appointment that was scheduled on tomorrow. I informed Beauford Bounds that I would inform Dr. Candise Chambers team.

## 2023-08-09 ENCOUNTER — Inpatient Hospital Stay

## 2023-08-10 ENCOUNTER — Other Ambulatory Visit: Payer: Self-pay | Admitting: Student

## 2023-08-11 LAB — TYPE AND SCREEN
ABO/RH(D): A NEG
Antibody Screen: NEGATIVE
Unit division: 0

## 2023-08-11 LAB — BPAM RBC
Blood Product Expiration Date: 202505072359
Unit Type and Rh: 600

## 2023-08-11 LAB — LAB REPORT - SCANNED: EGFR: 90

## 2023-08-12 ENCOUNTER — Telehealth: Payer: Self-pay | Admitting: *Deleted

## 2023-08-12 DIAGNOSIS — S82899A Other fracture of unspecified lower leg, initial encounter for closed fracture: Secondary | ICD-10-CM | POA: Diagnosis not present

## 2023-08-12 NOTE — Telephone Encounter (Signed)
 RTC to Gracie, PT [facility] asking for home PT for patient following her surgery. 1 time a week for 8 weeks.  Patient is currently non-weight bearing.  Will work on Field seismologist,  Landscape architect, Research scientist (medical) and education for the [employer] , Fall prevention and bed Mobility.  Verbal approval given will forward to PCP for Agreement or denial.   Copied from CRM [id number]. Topic: Clinical - Home Health Verbal Orders >> Aug 12, 2023  9:11 AM Jayson Michael wrote: Caller/Agency: gracie- [facility] Callback Number: [phone] Service Requested: Physical Therapy Frequency: 1 week 8  Any new concerns about the patient? No

## 2023-08-13 ENCOUNTER — Telehealth: Payer: Self-pay | Admitting: *Deleted

## 2023-08-13 ENCOUNTER — Ambulatory Visit (INDEPENDENT_AMBULATORY_CARE_PROVIDER_SITE_OTHER): Admitting: Internal Medicine

## 2023-08-13 VITALS — BP 145/69 | HR 58 | Temp 98.2°F | Ht 64.0 in

## 2023-08-13 DIAGNOSIS — T84629D Infection and inflammatory reaction due to internal fixation device of unspecified bone of leg, subsequent encounter: Secondary | ICD-10-CM | POA: Diagnosis not present

## 2023-08-13 DIAGNOSIS — S82851A Displaced trimalleolar fracture of right lower leg, initial encounter for closed fracture: Secondary | ICD-10-CM

## 2023-08-13 DIAGNOSIS — F54 Psychological and behavioral factors associated with disorders or diseases classified elsewhere: Secondary | ICD-10-CM

## 2023-08-13 DIAGNOSIS — E876 Hypokalemia: Secondary | ICD-10-CM

## 2023-08-13 DIAGNOSIS — F411 Generalized anxiety disorder: Secondary | ICD-10-CM

## 2023-08-13 MED ORDER — POTASSIUM CHLORIDE CRYS ER 20 MEQ PO TBCR: 20.0000 meq | EXTENDED_RELEASE_TABLET | Freq: Every day | ORAL | 0 refills | Status: DC

## 2023-08-13 MED ORDER — METHOCARBAMOL 750 MG PO TABS: 750.0000 mg | ORAL_TABLET | Freq: Three times a day (TID) | ORAL | 0 refills | Status: DC | PRN

## 2023-08-13 NOTE — Telephone Encounter (Signed)
 Pt is currently at her appt with Dr Rozelle Corning

## 2023-08-13 NOTE — Patient Instructions (Signed)
 It was a pleasure to care for you!  Please make sure to take your antibiotic three times daily. Take it at 7 AM, 3 PM, and 10 PM.  I will let you know what your lab results next week show. Make sure you take potassium 20 mEq each day.  My best, Dr. Rozelle Corning

## 2023-08-13 NOTE — Progress Notes (Deleted)
 Not yet working with PT  Was taking muscle relaxers and oxy but ran out Not taking tylenol  Refill a few robaxin  for muscle spasms around the ankle

## 2023-08-13 NOTE — Telephone Encounter (Signed)
 Copied from CRM [id number]. Topic: General - Other >> Aug 13, 2023  9:15 AM Blair Bumpers wrote: Reason for CRM: Peggy House, with [facility], called in with concerns about patient & dosing. She states patient is not dosing at set time/schedule & is missing her doses. Also stated that patient will no longer have someone assisting her with dosing. Peggy House stated they have been trying to contact patient but has not been able to reach her. Patient was seen by nurse already as well. For any further questions or concerns, please call Peggy House at [phone].

## 2023-08-13 NOTE — Progress Notes (Signed)
 Neuropsychological Consultation Comprehensive Inpatient Rehab   Patient:   Peggy House   DOB:   06-17-1960  MR Number:  [id number]  Location:  [facility] [address] [place] [address] Dept: [phone] Loc: [phone]           Date of Service:   05/09/2023  Start Time:   9 AM End Time:   10 AM  Provider/Observer:  Chapman Commodore, Psy.D.       Clinical Neuropsychologist       Billing Code/Service: [phone]  Reason for Service:    Peggy House is a 63 year old female referred for neuropsychological consultation during her ongoing admission to the comprehensive inpatient rehabilitation unit.  The patient has a past medical history including HTT with presentation to emergency department on 04/13/2019 for reporting recent syncopal episode with fall resulting in right ankle fracture.  Patient underwent ORIF on 04/24/2023.  There were issues with profound anemia and patient continued to have nosebleeds but due to concerns around DVT risk Xarelto  was discontinued and placed on DVT prophylaxis.  Patient continued with transfusions of packed red blood cells.  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  After therapy evaluations were reviewed and completed patient admitted to CIR due to ongoing dysfunction secondary to debility.  During clinical visit today the patient was oriented and was noted and aware of long-term medical issues etc.  The patient acknowledged difficulties coping with extended hospital stay and concerned about future risks around falls and her medical complication.  Patient acknowledged intrusive thinking and being overwhelmed by concerns about what she might be able to expect next as far as her overall medical status.  Patient has had extended hospital stay with ongoing care now and CIR due to debility and significant functional decline.  HPI  for the current admission:    HPI: Peggy House is a 63 year old female with a history of HTT presented to the ED on 04/13/2023 reporting syncopal episode and fall resulting in right ankle fracture.  She exhibited profound anemia on lab work with a hemoglobin 5.1. She underwent ORIF by [name] on 1/09 and she is NWB for 6-8 weeks. She continued to have nose bleeds, per primary team, the risk of chemoppx for DVT outweighs the benefits. Xarelto  discontinued and SCDs placed for DVT ppx. Transfused additional unit of pRBC for a total of 10 units since admission with one unit of FFP. Follows with Dr. Sonja Spavinaw. Cardiac workup for syncope, echo showed grade 2 diastolic dysfunction.  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 s/p IR embolization of gastric artery branch vessels on 12/10/17, cauterization of stomach for severe GI bleed in 11/2018, and epinephrine  injection for bleeding ulcer on 11/03/20. She has required frequent blood transfusions, IV iron  and bevacizumab . Has indwelling right internal jugular venous access device. PMH significant for DM-2, hypertension, depression. Pt able to stand pivot to [facility] with min A for RLE NWB and balance and dense cues for technique to pivot. Pt demonstrates difficulty increasing BUE support to pivot on LLE with tendency to hop on LLE, but is able to improve with cues and practice. The patient requires inpatient medicine and rehabilitation evaluations and services for ongoing dysfunction secondary to debility.  Medical History:   Past Medical History:  Diagnosis Date   Anxiety    Arthritis    knnes,back  GERD (gastroesophageal reflux disease)    Heart murmur, systolic 07/27/2023   Hereditary hemorrhagic telangiectasia (HCC)    History of swelling of feet    Hyperlipidemia    Hypertension    Major depressive disorder, recurrent episode (HCC) 06/05/2015   Obesity    Snores    Type 2 diabetes mellitus with vascular disease (HCC)  02/26/2019         Patient Active Problem List   Diagnosis Date Noted   Open wound of right ankle; discharged with wound vac 07/29/2023   Heart murmur, systolic 07/27/2023   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) 07/13/2023   Thrombocytopenia (HCC) 04/15/2023   Cholelithiasis 11/18/2022   Aortic atherosclerosis (HCC) 11/18/2022   Tobacco use disorder 06/22/2022   Depression with anxiety 04/12/2022   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/2021   Syncope 11/01/2020   Trapezius muscle spasm 07/22/2020   Leg pain, bilateral 09/28/2019   Urge incontinence 09/28/2019   Pain due to onychomycosis of toenails of both feet 02/26/2019   Diabetes mellitus (HCC) 02/26/2019   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   Anemia due to GI blood loss 12/07/2018   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   Hypokalemia    Port-A-Cath in place 09/18/2018   GAD (generalized anxiety disorder) 01/20/2018   Pulmonary artery hypertension (HCC) 10/19/2017   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 her dress was Appropriate and she was Well Groomed and her manners were Appropriate to the situation.  her participation was indicative of Appropriate and Redirectable behaviors.  There were physical disabilities noted.  she displayed an appropriate level of cooperation  and motivation.    Interactions:    Active Redirectable  Attention:   abnormal and attention span appeared shorter than expected for age  Memory:   within normal limits; recent and remote memory intact  Visuo-spatial:   not examined  Speech (Volume):  low  Speech:   normal; normal  Thought Process:  Coherent and Relevant  Coherent and Linear  Though Content:  WNL; not suicidal and not homicidal  Orientation:   person, place, and situation  Judgment:   Fair  Planning:   Fair  Affect:    Appropriate  Mood:    Anxious and Dysphoric  Insight:   Fair  Intelligence:   normal  Psychiatric History:  Patient with past history of anxiety disorder and continues on her home medicine of Celexa  which she denies any side effects 2.   Family Med/Psych History:  Family History  Problem Relation Age of Onset   Cancer Mother        Ovarian   Ovarian cancer Mother    Diabetes Mother    Kidney disease Mother    Bleeding Disorder Mother    Diabetes type II Sister    Bleeding Disorder Sister    Diabetes Sister    Colon cancer Maternal Grandfather    Crohn's disease Maternal Grandfather  Stomach cancer Maternal Grandmother    Kidney disease Son    Bleeding Disorder Son    Dysmenorrhea Neg Hx     Impression/DX:   Peggy House is a 63 year old female referred for neuropsychological consultation during her ongoing admission to the comprehensive inpatient rehabilitation unit.  The patient has a past medical history including HTT with presentation to emergency department on 04/13/2019 for reporting recent syncopal episode with fall resulting in right ankle fracture.  Patient underwent ORIF on 04/24/2023.  There were issues with profound anemia and patient continued to have nosebleeds but due to concerns around DVT risk Xarelto  was discontinued and placed on DVT prophylaxis.  Patient continued with transfusions of packed red blood cells.  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  After therapy evaluations were reviewed and completed patient admitted to CIR due to ongoing dysfunction secondary to debility.  During clinical visit today the patient was oriented and was noted and aware of long-term medical issues etc.  The patient acknowledged difficulties coping with extended hospital stay and concerned about future risks around falls and her medical complication.  Patient acknowledged intrusive thinking and being overwhelmed by concerns about what she might be able to expect next as far as her overall medical status.  Patient has had extended hospital stay with ongoing care now and CIR due to debility and significant functional decline.  Disposition/Plan:  Today we worked on coping and adjustment issues particular and issues related to anxiety and the way anxiety is impacting her therapeutic efforts on the unit and managing and coping with her long-term medical issues.  Diagnosis:    Coping style affecting medical status with significant anxiety symptoms.  Patient denies significant exacerbation of longstanding anxiety symptoms.         Electronically Signed   _______________________ Chapman Commodore, Psy.D. Clinical Neuropsychologist

## 2023-08-13 NOTE — Assessment & Plan Note (Addendum)
 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 She underwent multiple joint wash-outs and had the hardware removed, received antibiotic therapy and was discharged on cefazolin  through 05/12. Note however she has been only getting two of three doses daily as she often wakes up late and misses the morning dose. She has not yet been working with physical therapy, and remains non-weight bearing. Her follow up visit with ID is not until 06/25. Home health nurse collected labs 04/28 which showed elevated ESR to 65 and CRP to 24.4, though these have decreased from >130 and 90.3 respectively compared to 04/21. She has follow up with plastic surgery again tomorrow. Plan:Will place referral for ID as patient is unable to attend PM appointments, which are the only options in our clinic. Home health nurse to come to the home hopefully today to again review dosing of cefazolin , and I have advised that she administer it at 7A, 3P, and 10P. Will need recheck of inflammatory markers after resolution of acute inflammation. Will refill a short supply of methocarbamol  for muscle spasms around the affected ankle. Recommend that she take tylenol .

## 2023-08-13 NOTE — Progress Notes (Signed)
   CC: hospital f/u  HPI:  [name] is a 63 y.o. female with past medical history as detailed below who presents for hospital f/u. Please see problem based charting for detailed assessment and plan.  Past Medical History:  Diagnosis Date   Anxiety    Arthritis    knnes,back   GERD (gastroesophageal reflux disease)    Heart murmur, systolic 07/27/2023   Hereditary hemorrhagic telangiectasia (HCC)    History of swelling of feet    Hyperlipidemia    Hypertension    Major depressive disorder, recurrent episode (HCC) 06/05/2015   Obesity    Snores    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   Review of Systems:  Negative unless otherwise stated.  Physical Exam:  Vitals:   08/13/23 0936 08/13/23 1004  BP: (!) 146/68 (!) 145/69  Pulse: 63 (!) 58  Temp: 98.2 F (36.8 C)   TempSrc: Oral   SpO2: 100%   Height: 5\' 4"  (1.626 m)    Constitutional:In no acute distress. Cardio:Regular rate and rhythm. No murmurs, rubs, or gallops. Pulm:Clear to auscultation bilaterally. Normal work of breathing on room air. ZOX:WRUEAVWU for extremity edema. RLE with ace bandage wrapped around the ankle and foot.  Skin:Warm and dry. Neuro:Alert and oriented x3. No focal deficit noted. Psych:Pleasant mood and affect.  Assessment & Plan:   See Encounters Tab for problem based charting.  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 She underwent multiple joint wash-outs and had the hardware removed, received antibiotic therapy and was discharged on cefazolin  through 05/12. Note however she has been only getting two of three doses daily as she often wakes up late and misses the morning dose. She has not yet been working with physical therapy, and remains non-weight bearing. Her follow up visit with ID is not until 06/25. Home health nurse  collected labs 04/28 which showed elevated ESR to 65 and CRP to 24.4, though these have decreased from >130 and 90.3 respectively compared to 04/21. She has follow up with plastic surgery again tomorrow. Plan:Will place referral for ID as patient is unable to attend PM appointments, which are the only options in our clinic. Home health nurse to come to the home hopefully today to again review dosing of cefazolin , and I have advised that she administer it at 7A, 3P, and 10P. Will need recheck of inflammatory markers after resolution of acute inflammation. Will refill a short supply of methocarbamol  for muscle spasms around the affected ankle. Recommend that she take tylenol .  Hypokalemia Home health nurse collected labs on 04/28 show hypokalemia to 3.0. This was 3.4 6 days ago. She is not on any medications that are likely to cause this (she is on lisinopril  but renal function is WNL). Plan: Will start potassium chloride  20 mEq daily. F/u for lab only visit early next week to check potassium; HH nurse will assist in collection of these labs.  Patient discussed with Dr. Adriane Albe

## 2023-08-13 NOTE — Telephone Encounter (Signed)
 Call to [facility] from [facility] patient is missing her 3 AM dosing of her Antibiotic.  Talked with patient and her friend +about doing a 7 am, 3 pm and 10 pm schedule.  Patient and friend state that that will work for them. Patient has not had a dose for this morning.  Will do dose when she arrives at home.  Then will start the 3PM-10 PM and 7 AM schedule.  Nurse will call from [facility] to reinforce the schedule with patient today.

## 2023-08-13 NOTE — Assessment & Plan Note (Signed)
 Home health nurse collected labs on 04/28 show hypokalemia to 3.0. This was 3.4 6 days ago. She is not on any medications that are likely to cause this (she is on lisinopril  but renal function is WNL). Plan: Will start potassium chloride  20 mEq daily. F/u for lab only visit early next week to check potassium; HH nurse will assist in collection of these labs.

## 2023-08-14 ENCOUNTER — Telehealth: Payer: Self-pay | Admitting: *Deleted

## 2023-08-14 ENCOUNTER — Ambulatory Visit (INDEPENDENT_AMBULATORY_CARE_PROVIDER_SITE_OTHER): Admitting: Student

## 2023-08-14 ENCOUNTER — Telehealth: Payer: Self-pay

## 2023-08-14 VITALS — BP 145/77 | HR 71

## 2023-08-14 DIAGNOSIS — K219 Gastro-esophageal reflux disease without esophagitis: Secondary | ICD-10-CM | POA: Diagnosis not present

## 2023-08-14 DIAGNOSIS — D696 Thrombocytopenia, unspecified: Secondary | ICD-10-CM | POA: Diagnosis not present

## 2023-08-14 DIAGNOSIS — I2721 Secondary pulmonary arterial hypertension: Secondary | ICD-10-CM | POA: Diagnosis not present

## 2023-08-14 DIAGNOSIS — D509 Iron deficiency anemia, unspecified: Secondary | ICD-10-CM | POA: Diagnosis not present

## 2023-08-14 DIAGNOSIS — E876 Hypokalemia: Secondary | ICD-10-CM | POA: Diagnosis not present

## 2023-08-14 DIAGNOSIS — E1151 Type 2 diabetes mellitus with diabetic peripheral angiopathy without gangrene: Secondary | ICD-10-CM | POA: Diagnosis not present

## 2023-08-14 DIAGNOSIS — E785 Hyperlipidemia, unspecified: Secondary | ICD-10-CM | POA: Diagnosis not present

## 2023-08-14 DIAGNOSIS — E66813 Obesity, class 3: Secondary | ICD-10-CM | POA: Diagnosis not present

## 2023-08-14 DIAGNOSIS — G4700 Insomnia, unspecified: Secondary | ICD-10-CM | POA: Diagnosis not present

## 2023-08-14 DIAGNOSIS — Z86718 Personal history of other venous thrombosis and embolism: Secondary | ICD-10-CM | POA: Diagnosis not present

## 2023-08-14 DIAGNOSIS — S82841D Displaced bimalleolar fracture of right lower leg, subsequent encounter for closed fracture with routine healing: Secondary | ICD-10-CM | POA: Diagnosis not present

## 2023-08-14 DIAGNOSIS — I700 Atherosclerosis of aorta: Secondary | ICD-10-CM | POA: Diagnosis not present

## 2023-08-14 DIAGNOSIS — T8130XA Disruption of wound, unspecified, initial encounter: Secondary | ICD-10-CM

## 2023-08-14 DIAGNOSIS — F17210 Nicotine dependence, cigarettes, uncomplicated: Secondary | ICD-10-CM | POA: Diagnosis not present

## 2023-08-14 DIAGNOSIS — F411 Generalized anxiety disorder: Secondary | ICD-10-CM | POA: Diagnosis not present

## 2023-08-14 DIAGNOSIS — T8131XA Disruption of external operation (surgical) wound, not elsewhere classified, initial encounter: Secondary | ICD-10-CM | POA: Diagnosis not present

## 2023-08-14 DIAGNOSIS — M86271 Subacute osteomyelitis, right ankle and foot: Secondary | ICD-10-CM | POA: Diagnosis not present

## 2023-08-14 DIAGNOSIS — T8141XD Infection following a procedure, superficial incisional surgical site, subsequent encounter: Secondary | ICD-10-CM | POA: Diagnosis not present

## 2023-08-14 DIAGNOSIS — I10 Essential (primary) hypertension: Secondary | ICD-10-CM | POA: Diagnosis not present

## 2023-08-14 DIAGNOSIS — Z90710 Acquired absence of both cervix and uterus: Secondary | ICD-10-CM | POA: Diagnosis not present

## 2023-08-14 DIAGNOSIS — F339 Major depressive disorder, recurrent, unspecified: Secondary | ICD-10-CM | POA: Diagnosis not present

## 2023-08-14 DIAGNOSIS — Z556 Problems related to health literacy: Secondary | ICD-10-CM | POA: Diagnosis not present

## 2023-08-14 DIAGNOSIS — M159 Polyosteoarthritis, unspecified: Secondary | ICD-10-CM | POA: Diagnosis not present

## 2023-08-14 DIAGNOSIS — T84629A Infection and inflammatory reaction due to internal fixation device of unspecified bone of leg, initial encounter: Secondary | ICD-10-CM | POA: Diagnosis not present

## 2023-08-14 DIAGNOSIS — L03115 Cellulitis of right lower limb: Secondary | ICD-10-CM | POA: Diagnosis not present

## 2023-08-14 DIAGNOSIS — R32 Unspecified urinary incontinence: Secondary | ICD-10-CM | POA: Diagnosis not present

## 2023-08-14 NOTE — Telephone Encounter (Signed)
 RTC to [place] at [facility].  Message was left that the Clinics had returned her call.  Copied from CRM [id number]. Topic: General - Other >> Aug 14, 2023  3:03 PM Adrianna P wrote: Reason for CRM:  pt visit is going to be put on hold. please cal katherin from [facility] at [phone] can leave a voicemail

## 2023-08-14 NOTE — Telephone Encounter (Signed)
 Fax sent to [facility] with updated wound care instructions. Confirmation of receipt. [phone])

## 2023-08-14 NOTE — Progress Notes (Signed)
   Referring Provider Nooruddin, Saad, MD [address] [place],  [place] [address]   CC:  Chief Complaint  Patient presents with   Follow-up      Peggy House is an 63 y.o. female.  HPI: Patient is a 63 year old female with history of a right ankle wound. She most recently underwent excision of the right ankle wound and placement of wound VAC with [name] on 07/28/2023. She is a little over 2 weeks out from her procedure and she presents to the clinic today for postprocedural follow-up and a wound VAC change.   Patient was last seen in the clinic on 08/05/2023.  At this visit, patient was doing well.  On exam, wound VAC dressings were removed.  To the right lateral ankle, [employer] did not appear to be incorporated.  To the medial ankle, there was some dehiscence of the wound with exudate noted throughout.  There were no signs of infection on exam. Wound VAC was changed at this visit.  Recommended that she apply Xeroform over the medial wound.  Today, patient reports she is doing well.  She is accompanied by her friend at bedside.  She denies any new issues or concerns.  Denies any fevers or chills.  Patient and her friend state that patient does have home health and they are going to come out today to her house.  Review of Systems General: Denies any fevers or chills  Physical Exam    08/14/2023    8:36 AM 08/13/2023   10:04 AM 08/13/2023    9:36 AM  Vitals with BMI  Height   5\' 4"   Weight   --  Systolic 145 145 604  Diastolic 77 69 68  Pulse 71 58 63    General:  No acute distress,  Alert and oriented, Non-Toxic, Normal speech and affect On exam, patient is sitting upright in no acute distress.  Dressings were removed to the right lower extremity.  Medially, wound appears to have exudate throughout.  There is no surrounding erythema or drainage.  No signs of infection.  To the lateral wound, myriad appears to be unincorporated.  There are no signs of  infection.  Assessment/Plan  Wound dehiscence   Recommended that patient continue with wound care.  Will send updated wound care orders to home health care.  Recommend that she change the medial wound dressing daily.  Will write to have home health care changed wound VAC 3 times a week.  Patient expressed understanding.  Discussed with patient that it still looks like the myriad is not incorporated.  Discussed with her that we should give this a little bit more time.  She expressed understanding.  Patient to follow back up in about 2 weeks.  Instructed her and her friend to call if they have any questions or concerns about anything.  Pictures were obtained of the patient and placed in the chart with the patient's or guardian's permission.   [name] 08/14/2023, 9:45 AM

## 2023-08-14 NOTE — Telephone Encounter (Signed)
 RTC from [place] [facility] from [facility].  Will be putting patient on a 2 week hold as she is non-weight bearing and unable to do the PT.  Plan to check in on patient in 2 weeks to see if she will be abele to start her PT. Call was just to notify patients doctor about plan.

## 2023-08-15 ENCOUNTER — Telehealth: Payer: Self-pay | Admitting: *Deleted

## 2023-08-16 ENCOUNTER — Inpatient Hospital Stay

## 2023-08-16 NOTE — Progress Notes (Signed)
 Internal Medicine Clinic Attending  Case discussed with the resident at the time of the visit.  We reviewed the resident's history and exam and pertinent patient test results.  I agree with the assessment, diagnosis, and plan of care documented in the resident's note.

## 2023-08-18 NOTE — Telephone Encounter (Signed)
 Called patient left  voice message for patient regarding her mammogram that was canceled/ patient can call the breast center to reschedule this mammogram to the breast center at [phone].

## 2023-08-19 ENCOUNTER — Encounter: Admitting: Infectious Diseases

## 2023-08-21 ENCOUNTER — Inpatient Hospital Stay

## 2023-08-21 ENCOUNTER — Other Ambulatory Visit: Payer: Self-pay

## 2023-08-21 ENCOUNTER — Encounter (HOSPITAL_COMMUNITY): Payer: Self-pay | Admitting: Plastic Surgery

## 2023-08-21 DIAGNOSIS — E876 Hypokalemia: Secondary | ICD-10-CM | POA: Diagnosis not present

## 2023-08-21 DIAGNOSIS — E785 Hyperlipidemia, unspecified: Secondary | ICD-10-CM | POA: Diagnosis not present

## 2023-08-21 DIAGNOSIS — T84629A Infection and inflammatory reaction due to internal fixation device of unspecified bone of leg, initial encounter: Secondary | ICD-10-CM | POA: Diagnosis not present

## 2023-08-21 DIAGNOSIS — S82841D Displaced bimalleolar fracture of right lower leg, subsequent encounter for closed fracture with routine healing: Secondary | ICD-10-CM | POA: Diagnosis not present

## 2023-08-21 DIAGNOSIS — G4700 Insomnia, unspecified: Secondary | ICD-10-CM | POA: Diagnosis not present

## 2023-08-21 DIAGNOSIS — D696 Thrombocytopenia, unspecified: Secondary | ICD-10-CM | POA: Diagnosis not present

## 2023-08-21 DIAGNOSIS — Z86718 Personal history of other venous thrombosis and embolism: Secondary | ICD-10-CM | POA: Diagnosis not present

## 2023-08-21 DIAGNOSIS — I10 Essential (primary) hypertension: Secondary | ICD-10-CM | POA: Diagnosis not present

## 2023-08-21 DIAGNOSIS — Z556 Problems related to health literacy: Secondary | ICD-10-CM | POA: Diagnosis not present

## 2023-08-21 DIAGNOSIS — E1151 Type 2 diabetes mellitus with diabetic peripheral angiopathy without gangrene: Secondary | ICD-10-CM | POA: Diagnosis not present

## 2023-08-21 DIAGNOSIS — M86271 Subacute osteomyelitis, right ankle and foot: Secondary | ICD-10-CM | POA: Diagnosis not present

## 2023-08-21 DIAGNOSIS — R32 Unspecified urinary incontinence: Secondary | ICD-10-CM | POA: Diagnosis not present

## 2023-08-21 DIAGNOSIS — E66813 Obesity, class 3: Secondary | ICD-10-CM | POA: Diagnosis not present

## 2023-08-21 DIAGNOSIS — I2721 Secondary pulmonary arterial hypertension: Secondary | ICD-10-CM | POA: Diagnosis not present

## 2023-08-21 DIAGNOSIS — M159 Polyosteoarthritis, unspecified: Secondary | ICD-10-CM | POA: Diagnosis not present

## 2023-08-21 DIAGNOSIS — F411 Generalized anxiety disorder: Secondary | ICD-10-CM | POA: Diagnosis not present

## 2023-08-21 DIAGNOSIS — I700 Atherosclerosis of aorta: Secondary | ICD-10-CM | POA: Diagnosis not present

## 2023-08-21 DIAGNOSIS — F339 Major depressive disorder, recurrent, unspecified: Secondary | ICD-10-CM | POA: Diagnosis not present

## 2023-08-21 DIAGNOSIS — T8141XD Infection following a procedure, superficial incisional surgical site, subsequent encounter: Secondary | ICD-10-CM | POA: Diagnosis not present

## 2023-08-21 DIAGNOSIS — K219 Gastro-esophageal reflux disease without esophagitis: Secondary | ICD-10-CM | POA: Diagnosis not present

## 2023-08-21 DIAGNOSIS — D509 Iron deficiency anemia, unspecified: Secondary | ICD-10-CM | POA: Diagnosis not present

## 2023-08-21 DIAGNOSIS — T8130XA Disruption of wound, unspecified, initial encounter: Secondary | ICD-10-CM | POA: Diagnosis not present

## 2023-08-21 DIAGNOSIS — F17210 Nicotine dependence, cigarettes, uncomplicated: Secondary | ICD-10-CM | POA: Diagnosis not present

## 2023-08-21 DIAGNOSIS — Z90710 Acquired absence of both cervix and uterus: Secondary | ICD-10-CM | POA: Diagnosis not present

## 2023-08-21 DIAGNOSIS — T8131XA Disruption of external operation (surgical) wound, not elsewhere classified, initial encounter: Secondary | ICD-10-CM | POA: Diagnosis not present

## 2023-08-21 DIAGNOSIS — L03115 Cellulitis of right lower limb: Secondary | ICD-10-CM | POA: Diagnosis not present

## 2023-08-21 LAB — LAB REPORT - SCANNED: EGFR (African American): 98

## 2023-08-25 ENCOUNTER — Telehealth: Payer: Self-pay | Admitting: *Deleted

## 2023-08-25 NOTE — Telephone Encounter (Signed)
 Will forward to PCP.  Spoke with Nurse in office asking if orders have been sent to continue home care and labs. Copied from CRM [id number]. Topic: General - Other >> Aug 25, 2023 10:36 AM Brynn Caras wrote: Reason for CRM: The patient is receiving treatment at [facility] this morning, [place] would like to confirm if there is any paper orders for her treatment, if she has been extended, or if the orders can be sent over to them.  Callback #:[phone] Ex: [id number]

## 2023-08-26 ENCOUNTER — Other Ambulatory Visit: Payer: Self-pay | Admitting: Student

## 2023-08-27 ENCOUNTER — Ambulatory Visit: Admitting: Infectious Diseases

## 2023-08-27 ENCOUNTER — Telehealth: Payer: Self-pay

## 2023-08-27 VITALS — BP 153/68 | HR 66 | Ht 64.0 in

## 2023-08-27 DIAGNOSIS — T84629D Infection and inflammatory reaction due to internal fixation device of unspecified bone of leg, subsequent encounter: Secondary | ICD-10-CM | POA: Diagnosis not present

## 2023-08-27 MED ORDER — CEPHALEXIN 500 MG PO CAPS: 500.0000 mg | ORAL_CAPSULE | Freq: Four times a day (QID) | ORAL | 1 refills | Status: DC

## 2023-08-27 NOTE — Progress Notes (Signed)
   Subjective:    Patient ID: Peggy House, female  DOB: [birth year], 63 y.o.        MRN: [id number]   HPI 63 yo F with hx of infected, removed R tibia prosthesis 07-15-2023.  Her BCx were (-) Her Cx grew MSSA- she was treated with Ancef  to 08-25-23.  She has had f/u with plastics for help with wound.  She still has a wound vac.  She continues to have ankle throbbing and burning.  Her port remains in place. She has had no issues with her port.    Health Maintenance  Topic Date Due  . COVID-19 Vaccine (1) Never done  . OPHTHALMOLOGY EXAM  Never done  . Hepatitis C Screening  Never done  . DTaP/Tdap/Td (1 - Tdap) Never done  . Zoster Vaccines- Shingrix (1 of 2) Never done  . Cervical Cancer Screening (HPV/Pap Cotest)  Never done  . MAMMOGRAM  01/02/2011  . Pneumococcal Vaccine 55-40 Years old (2 of 2 - PCV) 04/12/2015  . FOOT EXAM  04/13/2020  . Diabetic kidney evaluation - Urine ACR  09/26/2020  . HEMOGLOBIN A1C  08/02/2023  . INFLUENZA VACCINE  11/14/2023  . Diabetic kidney evaluation - eGFR measurement  08/19/2024  . Colonoscopy  03/02/2030  . HIV Screening  Completed  . HPV VACCINES  Aged Out  . Meningococcal B Vaccine  Aged Out      Review of Systems  Constitutional:  Positive for chills. Negative for fever.  HENT:  Negative for nosebleeds.   Respiratory:  Negative for cough, hemoptysis and shortness of breath.   Gastrointestinal:  Negative for constipation and diarrhea.  Genitourinary:  Negative for dysuria.    Please see HPI. All other systems reviewed and negative.     Objective:  Physical Exam Vitals reviewed.  Constitutional:      Appearance: She is obese.  HENT:     Mouth/Throat:     Mouth: Mucous membranes are moist.     Pharynx: No oropharyngeal exudate.  Eyes:     Extraocular Movements: Extraocular movements intact.     Pupils: Pupils are equal, round, and reactive to light.  Cardiovascular:     Rate and Rhythm: Normal rate and regular  rhythm.  Pulmonary:     Effort: Pulmonary effort is normal.     Breath sounds: Normal breath sounds.  Abdominal:     General: Bowel sounds are normal. There is no distension.     Palpations: Abdomen is soft.     Tenderness: There is no abdominal tenderness.  Musculoskeletal:     Comments: She has a medial wound that is open at site of sutures.  On the lateral side there is a vac. A small amt of fluid has leaked out onto the floor. The VAC is off. There is no proximal heat or erythema or tenderness.   Neurological:     Mental Status: She is alert.          Assessment & Plan:

## 2023-08-27 NOTE — Assessment & Plan Note (Addendum)
 I am concerned about her wound I did not visualize the lateral side well but the medial side does not appear to be healing well.  Will give her an additional course of po anbx Will see her back in 1 month Will chcek her ESR and CRP Continued f/u with plastics.

## 2023-08-27 NOTE — Telephone Encounter (Signed)
 Faxed back [employer] signed by Anastacio Balm, [place] yesterday, 5/13. Received success fax confirmation. Forwarded 4 documents including fax success to front desk for batch scanning.

## 2023-08-28 ENCOUNTER — Ambulatory Visit: Payer: Self-pay | Admitting: Infectious Diseases

## 2023-08-28 LAB — SEDIMENTATION RATE: Sed Rate: 90 mm/h — ABNORMAL HIGH (ref 0–40)

## 2023-08-28 LAB — C-REACTIVE PROTEIN: CRP: 22 mg/L — ABNORMAL HIGH (ref 0–10)

## 2023-08-29 ENCOUNTER — Other Ambulatory Visit: Payer: Self-pay | Admitting: Internal Medicine

## 2023-08-29 ENCOUNTER — Ambulatory Visit: Admitting: Student

## 2023-08-30 DIAGNOSIS — M86271 Subacute osteomyelitis, right ankle and foot: Secondary | ICD-10-CM | POA: Diagnosis not present

## 2023-08-30 DIAGNOSIS — T8189XA Other complications of procedures, not elsewhere classified, initial encounter: Secondary | ICD-10-CM | POA: Diagnosis not present

## 2023-08-30 DIAGNOSIS — T8130XA Disruption of wound, unspecified, initial encounter: Secondary | ICD-10-CM | POA: Diagnosis not present

## 2023-08-30 DIAGNOSIS — T84629A Infection and inflammatory reaction due to internal fixation device of unspecified bone of leg, initial encounter: Secondary | ICD-10-CM | POA: Diagnosis not present

## 2023-08-30 DIAGNOSIS — M869 Osteomyelitis, unspecified: Secondary | ICD-10-CM | POA: Diagnosis not present

## 2023-09-01 ENCOUNTER — Telehealth: Payer: Self-pay | Admitting: *Deleted

## 2023-09-01 NOTE — Telephone Encounter (Signed)
 Peggy House states that she received a couple of missed phone calls from the office and is calling back to get the status. Please call Peggy House back.

## 2023-09-01 NOTE — Telephone Encounter (Signed)
 Call to [facility].  Need to know if care is still needed for the Port-A-Cath. Patient  completed IV antibiotics on 08/25/2023.  Need orders as to what needs to be done next. Please advise as soon as possible.

## 2023-09-01 NOTE — Telephone Encounter (Signed)
 RTC to [facility]. Copied from CRM [id number]. Topic: General - Other >> Aug 25, 2023 10:36 AM Brynn Caras wrote: Reason for CRM: The patient is receiving treatment at [facility] this morning, [place] would like to confirm if there is any paper orders for her treatment, if she has been extended, or if the orders can be sent over to them.  Callback #:[phone] Ex: [id number] >> Aug 28, 2023  9:21 AM Tisa Forester wrote:   Alyse July called on 08/28/23 on status of  patient is receiving treatment at [facility] this morning, [place] would like to confirm if there is any paper orders for her treatment, if she has been extended, or if the orders can be sent over to them.    Callback #:[phone] Ex: [id number]

## 2023-09-02 ENCOUNTER — Telehealth: Payer: Self-pay

## 2023-09-02 ENCOUNTER — Telehealth: Payer: Self-pay | Admitting: Student

## 2023-09-02 NOTE — Telephone Encounter (Signed)
 Pt called and said tube from wound vacc came off her leg and she wants to know what to do. Someone from clinical please call the pt back. Thank you

## 2023-09-02 NOTE — Telephone Encounter (Signed)
 I called the patient to find out what was going on with her wound vac. She stated it was completely off of her leg. She was not sure when home health was coming to change it. I spoke with Anastacio Balm who gave instructions to keep the wound moist. I informed the patient to keep the wound moist and we scheduled her to come in to the office tomorrow to get the wound vac back on. The patient only has Vaseline at home, so I instructed her that was fine to use with gauze and to keep the wound covered.

## 2023-09-02 NOTE — Telephone Encounter (Signed)
 I called the patient and she said the sponge is off of her leg. Peggy House scheduled her to come in tomorrow at 2:45 with [facility].

## 2023-09-03 ENCOUNTER — Ambulatory Visit: Admitting: Surgical

## 2023-09-03 ENCOUNTER — Encounter: Admitting: Infectious Diseases

## 2023-09-03 VITALS — BP 158/80 | HR 77

## 2023-09-03 DIAGNOSIS — T8130XA Disruption of wound, unspecified, initial encounter: Secondary | ICD-10-CM | POA: Diagnosis not present

## 2023-09-03 NOTE — Telephone Encounter (Signed)
 Call to [facility] at [facility].  Message was left for [facility] to call Dr. Maryalice Smaller as her office will be managing the Port-A-Cath for patient.

## 2023-09-03 NOTE — Telephone Encounter (Signed)
 Yes, both forms are in the blue team box under miscellaneous paperwork.

## 2023-09-03 NOTE — Progress Notes (Signed)
 Referring Provider Nooruddin, Saad, MD [address] [place],  [place] [address]   CC:  Chief Complaint  Patient presents with   Follow-up      Peggy House is an 63 y.o. female.  HPI: Patient is a 63 year old female here for follow-up on her right lower extremity wounds.  She underwent irrigation and debridement of open fracture and removal of hardware with orthopedics on 07/15/2023.  Underwent additional debridement on 07/21/2023 with orthopedics.  Subsequently underwent irrigation and debridement of right lower extremity wound, placement of myriad of wound VAC with [name] on 07/28/2023.  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  Patient was referred to infectious disease by PCP.  Patient reports she is currently receiving IV antibiotics, reports that she is self administering with help of family/friend.  Patient was last seen in our office on 08/14/2023.  According to the patient, she did not show for her last appointment at our office on 08/29/2023 as she did not have transportation, her transportation was not available.  She reports to me today that she has not been seen by home health recently, reports she has only had 1 appointment with home health.  She reports she is not having any infectious symptoms today.  She has been receiving assistance at home with dressing changes on the medial right leg wound.  Review of Systems General: No fevers or chills  Physical Exam    09/03/2023    2:58 PM 08/27/2023    3:09 PM 08/14/2023    8:36 AM  Vitals with BMI  Height  5\' 4"    Weight  --   Systolic 158 153 478  Diastolic 80 68 77  Pulse 77 66 71    General:  No acute distress,  Alert and oriented, Non-Toxic, Normal speech and affect Patient is sitting in a wheelchair.  Right lateral lower extremity: Green drainage consistent with Pseudomonas like appearance present on dressing of right lower  lateral extremity.  There is no surrounding erythema or cellulitic changes.  There is no overt foul odor is noted.  Right lower extremity lateral leg wound with good base of granulation tissue present.  Nylon sutures noted.  Right medial lower extremity wound: Granulation tissue noted, nylon sutures noted.  There is some exudate present as well.  No foul odors.  No active drainage noted.  Assessment/Plan 63 year old female with right lower extremity wounds, history of osteomyelitis and removal of hardware due to infection.  She was recently prescribed additional p.o. antibiotics by ID.  She is not having any overt symptoms of infection, however signs of Pseudomonas present on dressing change today.  Will reach out to ID for their opinion on coverage for Pseudomonas.  Of note, it is unclear when the last dressing change was on the right lower extremity.  It was last changed in our office on 08/14/2023.  It is unclear when she was last seen by home health.  Orders were previously placed for 3 times per week, but patient reports today that she has only seen home health once since being discharged from the hospital.  Will reach out to home health for clarification on if they are still providing care for patient and provide updated orders. Orders if able: Wound VAC changes to right lower extremity 2 times per week, apply Adaptic, thin amount of K-Y jelly, black sponge, clear VAC tape.  Secure wound VAC and place to 125 mmHg.  Apply Vaseline to left medial wound of ankle, cover with Mepilex border dressing.  Wrap lower extremity with Kerlix and Ace wrap.  If patient is unable to secure home health, will need wound VAC changes in office once per week.  Pictures were obtained of the patient and placed in the chart with the patient's or guardian's permission.  Greater than 25 minutes was spent on today's encounter with greater than 50% of this time spent face-to-face with the patient discussing plan of care,  evaluating patient, educating patient and family and completing wound care.  Patient is instructed to call with any changes or concerns.  [name] Keion Neels 09/03/2023, 3:38 PM

## 2023-09-04 ENCOUNTER — Telehealth: Payer: Self-pay

## 2023-09-04 ENCOUNTER — Other Ambulatory Visit: Payer: Self-pay

## 2023-09-04 NOTE — Telephone Encounter (Signed)
 Spoke with staff at [facility] home health regarding the status of visits for this patient. There was confusion and they stated they needed updated orders. Verbally told the staff the orders from this patients last visit with [facility]. I also faxed the updated wound orders to [phone]. Confirmation of receipt.

## 2023-09-05 ENCOUNTER — Telehealth: Payer: Self-pay | Admitting: Student

## 2023-09-05 ENCOUNTER — Telehealth: Payer: Self-pay

## 2023-09-05 DIAGNOSIS — Z90710 Acquired absence of both cervix and uterus: Secondary | ICD-10-CM | POA: Diagnosis not present

## 2023-09-05 DIAGNOSIS — Z86718 Personal history of other venous thrombosis and embolism: Secondary | ICD-10-CM | POA: Diagnosis not present

## 2023-09-05 DIAGNOSIS — G4700 Insomnia, unspecified: Secondary | ICD-10-CM | POA: Diagnosis not present

## 2023-09-05 DIAGNOSIS — L03115 Cellulitis of right lower limb: Secondary | ICD-10-CM | POA: Diagnosis not present

## 2023-09-05 DIAGNOSIS — T8131XA Disruption of external operation (surgical) wound, not elsewhere classified, initial encounter: Secondary | ICD-10-CM | POA: Diagnosis not present

## 2023-09-05 DIAGNOSIS — I10 Essential (primary) hypertension: Secondary | ICD-10-CM | POA: Diagnosis not present

## 2023-09-05 DIAGNOSIS — E1151 Type 2 diabetes mellitus with diabetic peripheral angiopathy without gangrene: Secondary | ICD-10-CM | POA: Diagnosis not present

## 2023-09-05 DIAGNOSIS — M159 Polyosteoarthritis, unspecified: Secondary | ICD-10-CM | POA: Diagnosis not present

## 2023-09-05 DIAGNOSIS — E785 Hyperlipidemia, unspecified: Secondary | ICD-10-CM | POA: Diagnosis not present

## 2023-09-05 DIAGNOSIS — F17210 Nicotine dependence, cigarettes, uncomplicated: Secondary | ICD-10-CM | POA: Diagnosis not present

## 2023-09-05 DIAGNOSIS — T8141XD Infection following a procedure, superficial incisional surgical site, subsequent encounter: Secondary | ICD-10-CM | POA: Diagnosis not present

## 2023-09-05 DIAGNOSIS — T8189XA Other complications of procedures, not elsewhere classified, initial encounter: Secondary | ICD-10-CM | POA: Diagnosis not present

## 2023-09-05 DIAGNOSIS — F339 Major depressive disorder, recurrent, unspecified: Secondary | ICD-10-CM | POA: Diagnosis not present

## 2023-09-05 DIAGNOSIS — E66813 Obesity, class 3: Secondary | ICD-10-CM | POA: Diagnosis not present

## 2023-09-05 DIAGNOSIS — I2721 Secondary pulmonary arterial hypertension: Secondary | ICD-10-CM | POA: Diagnosis not present

## 2023-09-05 DIAGNOSIS — E876 Hypokalemia: Secondary | ICD-10-CM | POA: Diagnosis not present

## 2023-09-05 DIAGNOSIS — D696 Thrombocytopenia, unspecified: Secondary | ICD-10-CM | POA: Diagnosis not present

## 2023-09-05 DIAGNOSIS — S82841D Displaced bimalleolar fracture of right lower leg, subsequent encounter for closed fracture with routine healing: Secondary | ICD-10-CM | POA: Diagnosis not present

## 2023-09-05 DIAGNOSIS — F411 Generalized anxiety disorder: Secondary | ICD-10-CM | POA: Diagnosis not present

## 2023-09-05 DIAGNOSIS — I700 Atherosclerosis of aorta: Secondary | ICD-10-CM | POA: Diagnosis not present

## 2023-09-05 DIAGNOSIS — K219 Gastro-esophageal reflux disease without esophagitis: Secondary | ICD-10-CM | POA: Diagnosis not present

## 2023-09-05 DIAGNOSIS — R32 Unspecified urinary incontinence: Secondary | ICD-10-CM | POA: Diagnosis not present

## 2023-09-05 DIAGNOSIS — Z556 Problems related to health literacy: Secondary | ICD-10-CM | POA: Diagnosis not present

## 2023-09-05 DIAGNOSIS — D509 Iron deficiency anemia, unspecified: Secondary | ICD-10-CM | POA: Diagnosis not present

## 2023-09-05 NOTE — Telephone Encounter (Signed)
 I spoke with [employer] and they will be sending the patient another box of wound vac supplies. I also called Dina Francisco back from home health and explained that the supplies will be coming next Wednesday. Anael stated that the wound vac was not changed today due to no supplies. The patient is scheduled to come to the clinic on Tuesday 5/27 for a vac change.

## 2023-09-05 NOTE — Telephone Encounter (Signed)
 No wound vacc supplies, she said no supplies came with wound vacc, Valarie Garner [phone]/ [phone] Office, fax [phone] fax, Dina Francisco came to change came to change and stated the pt has no supplies and mentioned that it needed to be changed 2-3 times a week? Please advise. Dina Francisco mentioned a Bernabe Brew talking with someone in the office already? Please advise

## 2023-09-05 NOTE — Telephone Encounter (Signed)
 Sent back requested information to [facility] stating this patient will need to continue with the wound vac for 2-3 months. [phone]) Confirmation of receipt.

## 2023-09-09 ENCOUNTER — Telehealth: Payer: Self-pay | Admitting: Surgical

## 2023-09-09 ENCOUNTER — Ambulatory Visit: Admitting: Student

## 2023-09-09 NOTE — Progress Notes (Deleted)
   Referring Provider Nooruddin, Saad, MD [address] [place],  [place] [address]   CC: No chief complaint on file.     Peggy House is an 63 y.o. female.  HPI: Patient is a 63 year old female here for follow-up on her right lower extremity wounds. She underwent irrigation and debridement of open fracture and removal of hardware with orthopedics on 07/15/2023. Underwent additional debridement on 07/21/2023 with orthopedics. Subsequently underwent irrigation and debridement of right lower extremity wound, placement of myriad of wound VAC with [name] on 07/28/2023.  She presents to the clinic today for further management of her wound.  Patient was last seen in the clinic on 09/03/2023.  At this visit, patient reported she was not having any infectious symptoms.  On exam, right lateral lower extremity wound was noted to have green drainage consistent with Pseudomonas like appearance.  There was a good base of granulation tissue noted.  To the medial wound, granulation tissue was noted as well as some exudate.  Given concern for Pseudomonas, infectious disease was contacted for their opinion on coverage for Pseudomonas.  Today,    Review of Systems General: ***  Physical Exam    09/03/2023    2:58 PM 08/27/2023    3:09 PM 08/14/2023    8:36 AM  Vitals with BMI  Height  5\' 4"    Weight  --   Systolic 158 153 578  Diastolic 80 68 77  Pulse 77 66 71    General:  No acute distress,  Alert and oriented, Non-Toxic, Normal speech and affect ***   Assessment/Plan ***  [name] 09/09/2023, 9:13 AM

## 2023-09-09 NOTE — Telephone Encounter (Signed)
Patient did not show up for her appointment today.

## 2023-09-09 NOTE — Telephone Encounter (Signed)
 Called patient, notified her that she had an appointment today at 1040 that she missed.  She seemed confused and unaware that she had an appointment.  I discussed with patient that she needs to be seen today for dressing change.  She reports that her dressing has not been changed since her appointment with our office last week.  She reports that home health came on Friday, but patient stated they did not change it because I told them you had just changed it".  I notified patient that home health is there to change her wound VAC and it needs to be changed at least twice per week.  Offered patient appointment this afternoon, she is going to see if she can get transportation from her son.  If she is unable, we will reach out to home health and see if they can change dressing tomorrow.  Spoke with Margretta Shi, RN, [facility] RN has notified us  that patient has been turning her VAC off occasionally.

## 2023-09-09 NOTE — Telephone Encounter (Signed)
 I called [employer] and they confirmed that the wound vac supplies will be arriving tomorrow 5/28 to the patients house. I confirmed with [facility] that they are going to the patients house Thursday 5/29 to change the vac. I also called Babara to make her aware of the incoming supplies and that [facility] will be coming to change her vac.

## 2023-09-11 DIAGNOSIS — I700 Atherosclerosis of aorta: Secondary | ICD-10-CM | POA: Diagnosis not present

## 2023-09-11 DIAGNOSIS — K219 Gastro-esophageal reflux disease without esophagitis: Secondary | ICD-10-CM | POA: Diagnosis not present

## 2023-09-11 DIAGNOSIS — G4700 Insomnia, unspecified: Secondary | ICD-10-CM | POA: Diagnosis not present

## 2023-09-11 DIAGNOSIS — E785 Hyperlipidemia, unspecified: Secondary | ICD-10-CM | POA: Diagnosis not present

## 2023-09-11 DIAGNOSIS — I10 Essential (primary) hypertension: Secondary | ICD-10-CM | POA: Diagnosis not present

## 2023-09-11 DIAGNOSIS — E1151 Type 2 diabetes mellitus with diabetic peripheral angiopathy without gangrene: Secondary | ICD-10-CM | POA: Diagnosis not present

## 2023-09-11 DIAGNOSIS — F339 Major depressive disorder, recurrent, unspecified: Secondary | ICD-10-CM | POA: Diagnosis not present

## 2023-09-11 DIAGNOSIS — F17210 Nicotine dependence, cigarettes, uncomplicated: Secondary | ICD-10-CM | POA: Diagnosis not present

## 2023-09-11 DIAGNOSIS — T8131XA Disruption of external operation (surgical) wound, not elsewhere classified, initial encounter: Secondary | ICD-10-CM | POA: Diagnosis not present

## 2023-09-11 DIAGNOSIS — E66813 Obesity, class 3: Secondary | ICD-10-CM | POA: Diagnosis not present

## 2023-09-11 DIAGNOSIS — F411 Generalized anxiety disorder: Secondary | ICD-10-CM | POA: Diagnosis not present

## 2023-09-11 DIAGNOSIS — S82841D Displaced bimalleolar fracture of right lower leg, subsequent encounter for closed fracture with routine healing: Secondary | ICD-10-CM | POA: Diagnosis not present

## 2023-09-11 DIAGNOSIS — Z556 Problems related to health literacy: Secondary | ICD-10-CM | POA: Diagnosis not present

## 2023-09-11 DIAGNOSIS — M159 Polyosteoarthritis, unspecified: Secondary | ICD-10-CM | POA: Diagnosis not present

## 2023-09-11 DIAGNOSIS — T8141XD Infection following a procedure, superficial incisional surgical site, subsequent encounter: Secondary | ICD-10-CM | POA: Diagnosis not present

## 2023-09-11 DIAGNOSIS — E876 Hypokalemia: Secondary | ICD-10-CM | POA: Diagnosis not present

## 2023-09-11 DIAGNOSIS — Z86718 Personal history of other venous thrombosis and embolism: Secondary | ICD-10-CM | POA: Diagnosis not present

## 2023-09-11 DIAGNOSIS — R32 Unspecified urinary incontinence: Secondary | ICD-10-CM | POA: Diagnosis not present

## 2023-09-11 DIAGNOSIS — D696 Thrombocytopenia, unspecified: Secondary | ICD-10-CM | POA: Diagnosis not present

## 2023-09-11 DIAGNOSIS — Z90710 Acquired absence of both cervix and uterus: Secondary | ICD-10-CM | POA: Diagnosis not present

## 2023-09-11 DIAGNOSIS — L03115 Cellulitis of right lower limb: Secondary | ICD-10-CM | POA: Diagnosis not present

## 2023-09-11 DIAGNOSIS — I2721 Secondary pulmonary arterial hypertension: Secondary | ICD-10-CM | POA: Diagnosis not present

## 2023-09-11 DIAGNOSIS — S82899A Other fracture of unspecified lower leg, initial encounter for closed fracture: Secondary | ICD-10-CM | POA: Diagnosis not present

## 2023-09-11 DIAGNOSIS — D509 Iron deficiency anemia, unspecified: Secondary | ICD-10-CM | POA: Diagnosis not present

## 2023-09-16 ENCOUNTER — Telehealth: Payer: Self-pay | Admitting: Surgical

## 2023-09-16 DIAGNOSIS — D509 Iron deficiency anemia, unspecified: Secondary | ICD-10-CM | POA: Diagnosis not present

## 2023-09-16 DIAGNOSIS — I700 Atherosclerosis of aorta: Secondary | ICD-10-CM | POA: Diagnosis not present

## 2023-09-16 DIAGNOSIS — F17210 Nicotine dependence, cigarettes, uncomplicated: Secondary | ICD-10-CM | POA: Diagnosis not present

## 2023-09-16 DIAGNOSIS — R32 Unspecified urinary incontinence: Secondary | ICD-10-CM | POA: Diagnosis not present

## 2023-09-16 DIAGNOSIS — I2721 Secondary pulmonary arterial hypertension: Secondary | ICD-10-CM | POA: Diagnosis not present

## 2023-09-16 DIAGNOSIS — F339 Major depressive disorder, recurrent, unspecified: Secondary | ICD-10-CM | POA: Diagnosis not present

## 2023-09-16 DIAGNOSIS — F411 Generalized anxiety disorder: Secondary | ICD-10-CM | POA: Diagnosis not present

## 2023-09-16 DIAGNOSIS — E876 Hypokalemia: Secondary | ICD-10-CM | POA: Diagnosis not present

## 2023-09-16 DIAGNOSIS — E1151 Type 2 diabetes mellitus with diabetic peripheral angiopathy without gangrene: Secondary | ICD-10-CM | POA: Diagnosis not present

## 2023-09-16 DIAGNOSIS — G4700 Insomnia, unspecified: Secondary | ICD-10-CM | POA: Diagnosis not present

## 2023-09-16 DIAGNOSIS — M159 Polyosteoarthritis, unspecified: Secondary | ICD-10-CM | POA: Diagnosis not present

## 2023-09-16 DIAGNOSIS — T8141XD Infection following a procedure, superficial incisional surgical site, subsequent encounter: Secondary | ICD-10-CM | POA: Diagnosis not present

## 2023-09-16 DIAGNOSIS — E785 Hyperlipidemia, unspecified: Secondary | ICD-10-CM | POA: Diagnosis not present

## 2023-09-16 DIAGNOSIS — T8131XA Disruption of external operation (surgical) wound, not elsewhere classified, initial encounter: Secondary | ICD-10-CM | POA: Diagnosis not present

## 2023-09-16 DIAGNOSIS — Z556 Problems related to health literacy: Secondary | ICD-10-CM | POA: Diagnosis not present

## 2023-09-16 DIAGNOSIS — E66813 Obesity, class 3: Secondary | ICD-10-CM | POA: Diagnosis not present

## 2023-09-16 DIAGNOSIS — Z90710 Acquired absence of both cervix and uterus: Secondary | ICD-10-CM | POA: Diagnosis not present

## 2023-09-16 DIAGNOSIS — K219 Gastro-esophageal reflux disease without esophagitis: Secondary | ICD-10-CM | POA: Diagnosis not present

## 2023-09-16 DIAGNOSIS — Z86718 Personal history of other venous thrombosis and embolism: Secondary | ICD-10-CM | POA: Diagnosis not present

## 2023-09-16 DIAGNOSIS — I10 Essential (primary) hypertension: Secondary | ICD-10-CM | POA: Diagnosis not present

## 2023-09-16 DIAGNOSIS — D696 Thrombocytopenia, unspecified: Secondary | ICD-10-CM | POA: Diagnosis not present

## 2023-09-16 DIAGNOSIS — L03115 Cellulitis of right lower limb: Secondary | ICD-10-CM | POA: Diagnosis not present

## 2023-09-16 DIAGNOSIS — S82841D Displaced bimalleolar fracture of right lower leg, subsequent encounter for closed fracture with routine healing: Secondary | ICD-10-CM | POA: Diagnosis not present

## 2023-09-16 NOTE — Telephone Encounter (Signed)
 pt HH came 09-15-23, pt called to check with Jolan Natal if she needed to come in wed 09-17-23, Pt stated HH is also come to home thurs 09-18-23. Per Margretta Shi and Bearden to sch pt 09-24-23, pt was unable to come monday 09-22-23

## 2023-09-17 ENCOUNTER — Ambulatory Visit: Admitting: Surgical

## 2023-09-19 ENCOUNTER — Telehealth: Payer: Self-pay | Admitting: *Deleted

## 2023-09-19 DIAGNOSIS — E1151 Type 2 diabetes mellitus with diabetic peripheral angiopathy without gangrene: Secondary | ICD-10-CM | POA: Diagnosis not present

## 2023-09-19 DIAGNOSIS — I10 Essential (primary) hypertension: Secondary | ICD-10-CM | POA: Diagnosis not present

## 2023-09-19 DIAGNOSIS — L03115 Cellulitis of right lower limb: Secondary | ICD-10-CM | POA: Diagnosis not present

## 2023-09-19 DIAGNOSIS — E785 Hyperlipidemia, unspecified: Secondary | ICD-10-CM | POA: Diagnosis not present

## 2023-09-19 DIAGNOSIS — F339 Major depressive disorder, recurrent, unspecified: Secondary | ICD-10-CM | POA: Diagnosis not present

## 2023-09-19 DIAGNOSIS — E876 Hypokalemia: Secondary | ICD-10-CM | POA: Diagnosis not present

## 2023-09-19 DIAGNOSIS — D509 Iron deficiency anemia, unspecified: Secondary | ICD-10-CM | POA: Diagnosis not present

## 2023-09-19 DIAGNOSIS — G4700 Insomnia, unspecified: Secondary | ICD-10-CM | POA: Diagnosis not present

## 2023-09-19 DIAGNOSIS — S82841D Displaced bimalleolar fracture of right lower leg, subsequent encounter for closed fracture with routine healing: Secondary | ICD-10-CM | POA: Diagnosis not present

## 2023-09-19 DIAGNOSIS — T8131XA Disruption of external operation (surgical) wound, not elsewhere classified, initial encounter: Secondary | ICD-10-CM | POA: Diagnosis not present

## 2023-09-19 DIAGNOSIS — M159 Polyosteoarthritis, unspecified: Secondary | ICD-10-CM | POA: Diagnosis not present

## 2023-09-19 DIAGNOSIS — T8141XD Infection following a procedure, superficial incisional surgical site, subsequent encounter: Secondary | ICD-10-CM | POA: Diagnosis not present

## 2023-09-19 DIAGNOSIS — Z90710 Acquired absence of both cervix and uterus: Secondary | ICD-10-CM | POA: Diagnosis not present

## 2023-09-19 DIAGNOSIS — Z86718 Personal history of other venous thrombosis and embolism: Secondary | ICD-10-CM | POA: Diagnosis not present

## 2023-09-19 DIAGNOSIS — Z556 Problems related to health literacy: Secondary | ICD-10-CM | POA: Diagnosis not present

## 2023-09-19 DIAGNOSIS — I700 Atherosclerosis of aorta: Secondary | ICD-10-CM | POA: Diagnosis not present

## 2023-09-19 DIAGNOSIS — I2721 Secondary pulmonary arterial hypertension: Secondary | ICD-10-CM | POA: Diagnosis not present

## 2023-09-19 DIAGNOSIS — R32 Unspecified urinary incontinence: Secondary | ICD-10-CM | POA: Diagnosis not present

## 2023-09-19 DIAGNOSIS — E66813 Obesity, class 3: Secondary | ICD-10-CM | POA: Diagnosis not present

## 2023-09-19 DIAGNOSIS — F411 Generalized anxiety disorder: Secondary | ICD-10-CM | POA: Diagnosis not present

## 2023-09-19 DIAGNOSIS — F17210 Nicotine dependence, cigarettes, uncomplicated: Secondary | ICD-10-CM | POA: Diagnosis not present

## 2023-09-19 DIAGNOSIS — D696 Thrombocytopenia, unspecified: Secondary | ICD-10-CM | POA: Diagnosis not present

## 2023-09-19 DIAGNOSIS — K219 Gastro-esophageal reflux disease without esophagitis: Secondary | ICD-10-CM | POA: Diagnosis not present

## 2023-09-19 NOTE — Telephone Encounter (Signed)
 Received on (09/11/2023) via of fax Physician Order from [facility].  Requesting signature,day,and return.  Given to provider to complete.    Physician Order signed,dated,and faxed back to [facility].  Confirmation received and copy scanned into the chart.//AB/CMA

## 2023-09-22 ENCOUNTER — Other Ambulatory Visit: Payer: Self-pay | Admitting: Student

## 2023-09-23 DIAGNOSIS — E876 Hypokalemia: Secondary | ICD-10-CM | POA: Diagnosis not present

## 2023-09-23 DIAGNOSIS — F411 Generalized anxiety disorder: Secondary | ICD-10-CM | POA: Diagnosis not present

## 2023-09-23 DIAGNOSIS — E66813 Obesity, class 3: Secondary | ICD-10-CM | POA: Diagnosis not present

## 2023-09-23 DIAGNOSIS — T8131XA Disruption of external operation (surgical) wound, not elsewhere classified, initial encounter: Secondary | ICD-10-CM | POA: Diagnosis not present

## 2023-09-23 DIAGNOSIS — F339 Major depressive disorder, recurrent, unspecified: Secondary | ICD-10-CM | POA: Diagnosis not present

## 2023-09-23 DIAGNOSIS — Z556 Problems related to health literacy: Secondary | ICD-10-CM | POA: Diagnosis not present

## 2023-09-23 DIAGNOSIS — L03115 Cellulitis of right lower limb: Secondary | ICD-10-CM | POA: Diagnosis not present

## 2023-09-23 DIAGNOSIS — D509 Iron deficiency anemia, unspecified: Secondary | ICD-10-CM | POA: Diagnosis not present

## 2023-09-23 DIAGNOSIS — F17210 Nicotine dependence, cigarettes, uncomplicated: Secondary | ICD-10-CM | POA: Diagnosis not present

## 2023-09-23 DIAGNOSIS — I700 Atherosclerosis of aorta: Secondary | ICD-10-CM | POA: Diagnosis not present

## 2023-09-23 DIAGNOSIS — S82841D Displaced bimalleolar fracture of right lower leg, subsequent encounter for closed fracture with routine healing: Secondary | ICD-10-CM | POA: Diagnosis not present

## 2023-09-23 DIAGNOSIS — M159 Polyosteoarthritis, unspecified: Secondary | ICD-10-CM | POA: Diagnosis not present

## 2023-09-23 DIAGNOSIS — G4700 Insomnia, unspecified: Secondary | ICD-10-CM | POA: Diagnosis not present

## 2023-09-23 DIAGNOSIS — R32 Unspecified urinary incontinence: Secondary | ICD-10-CM | POA: Diagnosis not present

## 2023-09-23 DIAGNOSIS — T8141XD Infection following a procedure, superficial incisional surgical site, subsequent encounter: Secondary | ICD-10-CM | POA: Diagnosis not present

## 2023-09-23 DIAGNOSIS — D696 Thrombocytopenia, unspecified: Secondary | ICD-10-CM | POA: Diagnosis not present

## 2023-09-23 DIAGNOSIS — Z86718 Personal history of other venous thrombosis and embolism: Secondary | ICD-10-CM | POA: Diagnosis not present

## 2023-09-23 DIAGNOSIS — E1151 Type 2 diabetes mellitus with diabetic peripheral angiopathy without gangrene: Secondary | ICD-10-CM | POA: Diagnosis not present

## 2023-09-23 DIAGNOSIS — I2721 Secondary pulmonary arterial hypertension: Secondary | ICD-10-CM | POA: Diagnosis not present

## 2023-09-23 DIAGNOSIS — K219 Gastro-esophageal reflux disease without esophagitis: Secondary | ICD-10-CM | POA: Diagnosis not present

## 2023-09-23 DIAGNOSIS — E785 Hyperlipidemia, unspecified: Secondary | ICD-10-CM | POA: Diagnosis not present

## 2023-09-23 DIAGNOSIS — Z90710 Acquired absence of both cervix and uterus: Secondary | ICD-10-CM | POA: Diagnosis not present

## 2023-09-23 DIAGNOSIS — I10 Essential (primary) hypertension: Secondary | ICD-10-CM | POA: Diagnosis not present

## 2023-09-24 ENCOUNTER — Ambulatory Visit: Admitting: Surgical

## 2023-09-24 VITALS — BP 169/72 | HR 60

## 2023-09-24 DIAGNOSIS — T8130XD Disruption of wound, unspecified, subsequent encounter: Secondary | ICD-10-CM

## 2023-09-24 DIAGNOSIS — T8130XA Disruption of wound, unspecified, initial encounter: Secondary | ICD-10-CM

## 2023-09-24 NOTE — Progress Notes (Signed)
   Referring Provider Nooruddin, Saad, MD [address] [place], [address] [place],  [place] [address]   CC:  Chief Complaint  Patient presents with   Follow-up      Peggy House is an 63 y.o. female.  HPI: Patient is a 63 year old female here for follow-up on her right lower extremity wounds.  She underwent irrigation debridement of open fracture and removal of hardware with orthopedics on 07/15/2023.  Underwent additional debridement on 07/21/2023 with orthopedics.  Subsequently underwent debridement with [name] with placement of myriad and wound VAC on 07/28/2023.  She does have osteomyelitis as well as infection associated with internal fixation device, had removal of the right tibia prosthesis.  Patient was referred to infectious disease by PCP.  She reports she is doing well today, she has been seeing home health twice weekly for wound VAC changes.  She feels as if this has been going well.  She reports some ongoing pain, but no other complaints.  Has been tolerating the wound VAC without any issues.  She was last seen in our office on 09/03/2023 3 weeks ago.  She is not having any infectious symptoms.  Review of Systems General: No fevers or chills  Physical Exam    09/24/2023   11:38 AM 09/24/2023   11:33 AM 09/03/2023    2:58 PM  Vitals with BMI  Systolic 169 162 086  Diastolic 72 82 80  Pulse  60 77    General:  No acute distress,  Alert and oriented, Non-Toxic, Normal speech and affect Right lower extremity: Minimal swelling of right lower extremity noted.  Distal extremities warm to touch.  No foul odor is noted of wound.  Medial lower extremity wound is 3.5 x 1 x 0.1 cm.  Healthy base of granulation tissue noted with some mild fibrinous exudate.  There is no surrounding erythema or cellulitic changes.  Lateral left lower extremity wound 10 x 1.5 x 0.2 cm.  There is no surrounding erythema or cellulitic changes.  Healthy base of granulation tissue noted with new  epithelialization noted.       Assessment/Plan Patient is overall doing well, improvement is noted.  Nylon sutures were removed she tolerated this well.  We discussed change in wound care regimen as stated below.  We will fax orders/notes to home health RN to assist with continued dressing changes twice per week.  Recommend every other day dressing changes with collagen, family to change days that [facility] RN not able to.   Wound care regimen for medial and lateral right lower extremity wound: Apply collagen followed by Adaptic, K-Y jelly, 4 x 4 gauze, Kerlix, Ace wrap to medial and lateral right lower extremity wound.  Recommend changing this every other day.  Recommend assistance from home health RN wound care twice per week.   We will have patient on a wound VAC holiday for the next 2 weeks, will reevaluate wound in 2 weeks and determine if wound VAC needs to be continued or can be discontinued.   Drainage today significantly improved, do not appreciate any overt signs of infection on exam.  Pictures were obtained of the patient and placed in the chart with the patient's or guardian's permission.  F/u in 3 weeks.  Peggy House 09/24/2023, 12:04 PM

## 2023-09-25 DIAGNOSIS — F339 Major depressive disorder, recurrent, unspecified: Secondary | ICD-10-CM | POA: Diagnosis not present

## 2023-09-25 DIAGNOSIS — I2721 Secondary pulmonary arterial hypertension: Secondary | ICD-10-CM | POA: Diagnosis not present

## 2023-09-25 DIAGNOSIS — T8131XA Disruption of external operation (surgical) wound, not elsewhere classified, initial encounter: Secondary | ICD-10-CM | POA: Diagnosis not present

## 2023-09-25 DIAGNOSIS — Z90710 Acquired absence of both cervix and uterus: Secondary | ICD-10-CM | POA: Diagnosis not present

## 2023-09-25 DIAGNOSIS — Z86718 Personal history of other venous thrombosis and embolism: Secondary | ICD-10-CM | POA: Diagnosis not present

## 2023-09-25 DIAGNOSIS — F411 Generalized anxiety disorder: Secondary | ICD-10-CM | POA: Diagnosis not present

## 2023-09-25 DIAGNOSIS — M159 Polyosteoarthritis, unspecified: Secondary | ICD-10-CM | POA: Diagnosis not present

## 2023-09-25 DIAGNOSIS — T8141XD Infection following a procedure, superficial incisional surgical site, subsequent encounter: Secondary | ICD-10-CM | POA: Diagnosis not present

## 2023-09-25 DIAGNOSIS — I10 Essential (primary) hypertension: Secondary | ICD-10-CM | POA: Diagnosis not present

## 2023-09-25 DIAGNOSIS — E876 Hypokalemia: Secondary | ICD-10-CM | POA: Diagnosis not present

## 2023-09-25 DIAGNOSIS — D509 Iron deficiency anemia, unspecified: Secondary | ICD-10-CM | POA: Diagnosis not present

## 2023-09-25 DIAGNOSIS — K219 Gastro-esophageal reflux disease without esophagitis: Secondary | ICD-10-CM | POA: Diagnosis not present

## 2023-09-25 DIAGNOSIS — E66813 Obesity, class 3: Secondary | ICD-10-CM | POA: Diagnosis not present

## 2023-09-25 DIAGNOSIS — D696 Thrombocytopenia, unspecified: Secondary | ICD-10-CM | POA: Diagnosis not present

## 2023-09-25 DIAGNOSIS — G4700 Insomnia, unspecified: Secondary | ICD-10-CM | POA: Diagnosis not present

## 2023-09-25 DIAGNOSIS — E785 Hyperlipidemia, unspecified: Secondary | ICD-10-CM | POA: Diagnosis not present

## 2023-09-25 DIAGNOSIS — E1151 Type 2 diabetes mellitus with diabetic peripheral angiopathy without gangrene: Secondary | ICD-10-CM | POA: Diagnosis not present

## 2023-09-25 DIAGNOSIS — F17210 Nicotine dependence, cigarettes, uncomplicated: Secondary | ICD-10-CM | POA: Diagnosis not present

## 2023-09-25 DIAGNOSIS — I700 Atherosclerosis of aorta: Secondary | ICD-10-CM | POA: Diagnosis not present

## 2023-09-25 DIAGNOSIS — S82841D Displaced bimalleolar fracture of right lower leg, subsequent encounter for closed fracture with routine healing: Secondary | ICD-10-CM | POA: Diagnosis not present

## 2023-09-25 DIAGNOSIS — R32 Unspecified urinary incontinence: Secondary | ICD-10-CM | POA: Diagnosis not present

## 2023-09-25 DIAGNOSIS — L03115 Cellulitis of right lower limb: Secondary | ICD-10-CM | POA: Diagnosis not present

## 2023-09-25 DIAGNOSIS — Z556 Problems related to health literacy: Secondary | ICD-10-CM | POA: Diagnosis not present

## 2023-09-26 DIAGNOSIS — S81801A Unspecified open wound, right lower leg, initial encounter: Secondary | ICD-10-CM | POA: Diagnosis not present

## 2023-09-30 DIAGNOSIS — E1151 Type 2 diabetes mellitus with diabetic peripheral angiopathy without gangrene: Secondary | ICD-10-CM | POA: Diagnosis not present

## 2023-09-30 DIAGNOSIS — F411 Generalized anxiety disorder: Secondary | ICD-10-CM | POA: Diagnosis not present

## 2023-09-30 DIAGNOSIS — D696 Thrombocytopenia, unspecified: Secondary | ICD-10-CM | POA: Diagnosis not present

## 2023-09-30 DIAGNOSIS — M869 Osteomyelitis, unspecified: Secondary | ICD-10-CM | POA: Diagnosis not present

## 2023-09-30 DIAGNOSIS — M86271 Subacute osteomyelitis, right ankle and foot: Secondary | ICD-10-CM | POA: Diagnosis not present

## 2023-09-30 DIAGNOSIS — T84629A Infection and inflammatory reaction due to internal fixation device of unspecified bone of leg, initial encounter: Secondary | ICD-10-CM | POA: Diagnosis not present

## 2023-09-30 DIAGNOSIS — I2721 Secondary pulmonary arterial hypertension: Secondary | ICD-10-CM | POA: Diagnosis not present

## 2023-09-30 DIAGNOSIS — E876 Hypokalemia: Secondary | ICD-10-CM | POA: Diagnosis not present

## 2023-09-30 DIAGNOSIS — D509 Iron deficiency anemia, unspecified: Secondary | ICD-10-CM | POA: Diagnosis not present

## 2023-09-30 DIAGNOSIS — T8131XA Disruption of external operation (surgical) wound, not elsewhere classified, initial encounter: Secondary | ICD-10-CM | POA: Diagnosis not present

## 2023-09-30 DIAGNOSIS — E785 Hyperlipidemia, unspecified: Secondary | ICD-10-CM | POA: Diagnosis not present

## 2023-09-30 DIAGNOSIS — M159 Polyosteoarthritis, unspecified: Secondary | ICD-10-CM | POA: Diagnosis not present

## 2023-09-30 DIAGNOSIS — Z90710 Acquired absence of both cervix and uterus: Secondary | ICD-10-CM | POA: Diagnosis not present

## 2023-09-30 DIAGNOSIS — I10 Essential (primary) hypertension: Secondary | ICD-10-CM | POA: Diagnosis not present

## 2023-09-30 DIAGNOSIS — L03115 Cellulitis of right lower limb: Secondary | ICD-10-CM | POA: Diagnosis not present

## 2023-09-30 DIAGNOSIS — F17210 Nicotine dependence, cigarettes, uncomplicated: Secondary | ICD-10-CM | POA: Diagnosis not present

## 2023-09-30 DIAGNOSIS — T8141XD Infection following a procedure, superficial incisional surgical site, subsequent encounter: Secondary | ICD-10-CM | POA: Diagnosis not present

## 2023-09-30 DIAGNOSIS — Z556 Problems related to health literacy: Secondary | ICD-10-CM | POA: Diagnosis not present

## 2023-09-30 DIAGNOSIS — G4700 Insomnia, unspecified: Secondary | ICD-10-CM | POA: Diagnosis not present

## 2023-09-30 DIAGNOSIS — K219 Gastro-esophageal reflux disease without esophagitis: Secondary | ICD-10-CM | POA: Diagnosis not present

## 2023-09-30 DIAGNOSIS — R32 Unspecified urinary incontinence: Secondary | ICD-10-CM | POA: Diagnosis not present

## 2023-09-30 DIAGNOSIS — F339 Major depressive disorder, recurrent, unspecified: Secondary | ICD-10-CM | POA: Diagnosis not present

## 2023-09-30 DIAGNOSIS — Z86718 Personal history of other venous thrombosis and embolism: Secondary | ICD-10-CM | POA: Diagnosis not present

## 2023-09-30 DIAGNOSIS — S82841D Displaced bimalleolar fracture of right lower leg, subsequent encounter for closed fracture with routine healing: Secondary | ICD-10-CM | POA: Diagnosis not present

## 2023-09-30 DIAGNOSIS — I700 Atherosclerosis of aorta: Secondary | ICD-10-CM | POA: Diagnosis not present

## 2023-09-30 DIAGNOSIS — E66813 Obesity, class 3: Secondary | ICD-10-CM | POA: Diagnosis not present

## 2023-09-30 DIAGNOSIS — T8130XA Disruption of wound, unspecified, initial encounter: Secondary | ICD-10-CM | POA: Diagnosis not present

## 2023-10-02 ENCOUNTER — Telehealth: Payer: Self-pay

## 2023-10-02 NOTE — Telephone Encounter (Signed)
 [name] from [facility] left voicemail on Mon, 6/16 asking for wound measurements and if patient was still on vac.   I called her back at ext [phone] and adv her of [name]'s most recent ov note on 6/11 that pt was placed on a vac holiday for 2 weeks and next f/u was scheduled for 6/25. At that time she would be evaluated to see if she needed to be placed back on the vac.   Gave wound measurements documented in his note. She conveyed understanding.

## 2023-10-03 ENCOUNTER — Telehealth: Payer: Self-pay | Admitting: *Deleted

## 2023-10-03 DIAGNOSIS — M159 Polyosteoarthritis, unspecified: Secondary | ICD-10-CM | POA: Diagnosis not present

## 2023-10-03 DIAGNOSIS — T8131XA Disruption of external operation (surgical) wound, not elsewhere classified, initial encounter: Secondary | ICD-10-CM | POA: Diagnosis not present

## 2023-10-03 DIAGNOSIS — T8141XD Infection following a procedure, superficial incisional surgical site, subsequent encounter: Secondary | ICD-10-CM | POA: Diagnosis not present

## 2023-10-03 DIAGNOSIS — E876 Hypokalemia: Secondary | ICD-10-CM | POA: Diagnosis not present

## 2023-10-03 DIAGNOSIS — Z90710 Acquired absence of both cervix and uterus: Secondary | ICD-10-CM | POA: Diagnosis not present

## 2023-10-03 DIAGNOSIS — F17210 Nicotine dependence, cigarettes, uncomplicated: Secondary | ICD-10-CM | POA: Diagnosis not present

## 2023-10-03 DIAGNOSIS — E1151 Type 2 diabetes mellitus with diabetic peripheral angiopathy without gangrene: Secondary | ICD-10-CM | POA: Diagnosis not present

## 2023-10-03 DIAGNOSIS — E785 Hyperlipidemia, unspecified: Secondary | ICD-10-CM | POA: Diagnosis not present

## 2023-10-03 DIAGNOSIS — D696 Thrombocytopenia, unspecified: Secondary | ICD-10-CM | POA: Diagnosis not present

## 2023-10-03 DIAGNOSIS — I700 Atherosclerosis of aorta: Secondary | ICD-10-CM | POA: Diagnosis not present

## 2023-10-03 DIAGNOSIS — F339 Major depressive disorder, recurrent, unspecified: Secondary | ICD-10-CM | POA: Diagnosis not present

## 2023-10-03 DIAGNOSIS — Z86718 Personal history of other venous thrombosis and embolism: Secondary | ICD-10-CM | POA: Diagnosis not present

## 2023-10-03 DIAGNOSIS — D509 Iron deficiency anemia, unspecified: Secondary | ICD-10-CM | POA: Diagnosis not present

## 2023-10-03 DIAGNOSIS — Z556 Problems related to health literacy: Secondary | ICD-10-CM | POA: Diagnosis not present

## 2023-10-03 DIAGNOSIS — G4700 Insomnia, unspecified: Secondary | ICD-10-CM | POA: Diagnosis not present

## 2023-10-03 DIAGNOSIS — I10 Essential (primary) hypertension: Secondary | ICD-10-CM | POA: Diagnosis not present

## 2023-10-03 DIAGNOSIS — K219 Gastro-esophageal reflux disease without esophagitis: Secondary | ICD-10-CM | POA: Diagnosis not present

## 2023-10-03 DIAGNOSIS — L03115 Cellulitis of right lower limb: Secondary | ICD-10-CM | POA: Diagnosis not present

## 2023-10-03 DIAGNOSIS — R32 Unspecified urinary incontinence: Secondary | ICD-10-CM | POA: Diagnosis not present

## 2023-10-03 DIAGNOSIS — I2721 Secondary pulmonary arterial hypertension: Secondary | ICD-10-CM | POA: Diagnosis not present

## 2023-10-03 DIAGNOSIS — S82841D Displaced bimalleolar fracture of right lower leg, subsequent encounter for closed fracture with routine healing: Secondary | ICD-10-CM | POA: Diagnosis not present

## 2023-10-03 DIAGNOSIS — E66813 Obesity, class 3: Secondary | ICD-10-CM | POA: Diagnosis not present

## 2023-10-03 DIAGNOSIS — F411 Generalized anxiety disorder: Secondary | ICD-10-CM | POA: Diagnosis not present

## 2023-10-03 NOTE — Telephone Encounter (Addendum)
 Received on (09/18/2023) via of fax Physician Order from [facility].  Requesting signature,date, and return.  Given to provider to complete.  Physician Order signed,dated,and faxed back to [facility].  Confirmation received and copy scanned into the chart.//AB/CMA

## 2023-10-06 DIAGNOSIS — F17210 Nicotine dependence, cigarettes, uncomplicated: Secondary | ICD-10-CM | POA: Diagnosis not present

## 2023-10-06 DIAGNOSIS — M159 Polyosteoarthritis, unspecified: Secondary | ICD-10-CM | POA: Diagnosis not present

## 2023-10-06 DIAGNOSIS — F339 Major depressive disorder, recurrent, unspecified: Secondary | ICD-10-CM | POA: Diagnosis not present

## 2023-10-06 DIAGNOSIS — G4700 Insomnia, unspecified: Secondary | ICD-10-CM | POA: Diagnosis not present

## 2023-10-06 DIAGNOSIS — R32 Unspecified urinary incontinence: Secondary | ICD-10-CM | POA: Diagnosis not present

## 2023-10-06 DIAGNOSIS — K219 Gastro-esophageal reflux disease without esophagitis: Secondary | ICD-10-CM | POA: Diagnosis not present

## 2023-10-06 DIAGNOSIS — D509 Iron deficiency anemia, unspecified: Secondary | ICD-10-CM | POA: Diagnosis not present

## 2023-10-06 DIAGNOSIS — T8131XA Disruption of external operation (surgical) wound, not elsewhere classified, initial encounter: Secondary | ICD-10-CM | POA: Diagnosis not present

## 2023-10-06 DIAGNOSIS — Z86718 Personal history of other venous thrombosis and embolism: Secondary | ICD-10-CM | POA: Diagnosis not present

## 2023-10-06 DIAGNOSIS — F411 Generalized anxiety disorder: Secondary | ICD-10-CM | POA: Diagnosis not present

## 2023-10-06 DIAGNOSIS — S82841D Displaced bimalleolar fracture of right lower leg, subsequent encounter for closed fracture with routine healing: Secondary | ICD-10-CM | POA: Diagnosis not present

## 2023-10-06 DIAGNOSIS — D696 Thrombocytopenia, unspecified: Secondary | ICD-10-CM | POA: Diagnosis not present

## 2023-10-06 DIAGNOSIS — E1151 Type 2 diabetes mellitus with diabetic peripheral angiopathy without gangrene: Secondary | ICD-10-CM | POA: Diagnosis not present

## 2023-10-06 DIAGNOSIS — I10 Essential (primary) hypertension: Secondary | ICD-10-CM | POA: Diagnosis not present

## 2023-10-06 DIAGNOSIS — T8141XD Infection following a procedure, superficial incisional surgical site, subsequent encounter: Secondary | ICD-10-CM | POA: Diagnosis not present

## 2023-10-06 DIAGNOSIS — L03115 Cellulitis of right lower limb: Secondary | ICD-10-CM | POA: Diagnosis not present

## 2023-10-06 DIAGNOSIS — I2721 Secondary pulmonary arterial hypertension: Secondary | ICD-10-CM | POA: Diagnosis not present

## 2023-10-06 DIAGNOSIS — Z90710 Acquired absence of both cervix and uterus: Secondary | ICD-10-CM | POA: Diagnosis not present

## 2023-10-06 DIAGNOSIS — E66813 Obesity, class 3: Secondary | ICD-10-CM | POA: Diagnosis not present

## 2023-10-06 DIAGNOSIS — Z556 Problems related to health literacy: Secondary | ICD-10-CM | POA: Diagnosis not present

## 2023-10-06 DIAGNOSIS — E785 Hyperlipidemia, unspecified: Secondary | ICD-10-CM | POA: Diagnosis not present

## 2023-10-06 DIAGNOSIS — I700 Atherosclerosis of aorta: Secondary | ICD-10-CM | POA: Diagnosis not present

## 2023-10-06 DIAGNOSIS — E876 Hypokalemia: Secondary | ICD-10-CM | POA: Diagnosis not present

## 2023-10-07 NOTE — Progress Notes (Deleted)
   Referring Provider Nooruddin, Saad, MD [address] [place], [address] [place],  [place] [address]   CC: No chief complaint on file.     Peggy House is an 63 y.o. female.  HPI: 63 year old female here for follow-up on her right lower extremity wounds.  She had an open fracture, subsequently had hardware removal with orthopedics on 07/15/2023.  She had additional debridement with orthopedics on 07/21/2023 and then underwent debridement and application of myriad on 07/28/2023 with Dr. Lowery.  The wound has been managed with wound VAC and was most recently transition to wound care with Adaptic and K-Y jelly and she has been on a wound VAC holiday.  Review of Systems General: ***  Physical Exam    09/24/2023   11:38 AM 09/24/2023   11:33 AM 09/03/2023    2:58 PM  Vitals with BMI  Systolic 169 162 841  Diastolic 72 82 80  Pulse  60 77    General:  No acute distress,  Alert and oriented, Non-Toxic, Normal speech and affect ***   Assessment/Plan ***  Peggy House 10/07/2023, 3:28 PM

## 2023-10-08 ENCOUNTER — Encounter: Admitting: Infectious Diseases

## 2023-10-08 ENCOUNTER — Ambulatory Visit: Admitting: Surgical

## 2023-10-08 ENCOUNTER — Telehealth: Payer: Self-pay

## 2023-10-08 ENCOUNTER — Telehealth: Payer: Self-pay | Admitting: Surgical

## 2023-10-08 DIAGNOSIS — M25571 Pain in right ankle and joints of right foot: Secondary | ICD-10-CM | POA: Diagnosis not present

## 2023-10-08 NOTE — Telephone Encounter (Signed)
 I called the patient because she missed her appointment this morning. She stated she was unaware that she had one today. I asked her if the wound vac was off and if home health was doing dressing changes. She stated that the vac was off and home health was dressing her wound. I asked the front desk to put her on the schedule for tomorrow, so we can check the progress of the wound.

## 2023-10-08 NOTE — Telephone Encounter (Signed)
 Patient couldn't come in this week due to scheduling conflicts and has an appt on Monday the 30th

## 2023-10-09 ENCOUNTER — Other Ambulatory Visit: Payer: Self-pay

## 2023-10-09 ENCOUNTER — Other Ambulatory Visit (HOSPITAL_COMMUNITY): Payer: Self-pay

## 2023-10-10 ENCOUNTER — Telehealth: Payer: Self-pay

## 2023-10-10 DIAGNOSIS — I10 Essential (primary) hypertension: Secondary | ICD-10-CM | POA: Diagnosis not present

## 2023-10-10 DIAGNOSIS — H25811 Combined forms of age-related cataract, right eye: Secondary | ICD-10-CM | POA: Diagnosis not present

## 2023-10-10 DIAGNOSIS — Z87891 Personal history of nicotine dependence: Secondary | ICD-10-CM | POA: Diagnosis not present

## 2023-10-10 NOTE — Telephone Encounter (Signed)
 [place] [facility] (Key: [id number])  Your information has been sent to [employer] [place]  Medicaid [employer] [place]  Medicaid has not replied to your PA request. Turnaround time for review of a PA request is dependent upon insurance plan and can range from 24 hours to 5 calendar days.  You may close this dialog, return to your dashboard, and perform other tasks. To check for an update later, click on the [employer] Payer Detailed Status located in the upper right corner of your dashboard.  If this search option is not available or [employer] [place]  Medicaid has not replied to your request within this timeframe, please contact the number on the back of the patient's insurance card.

## 2023-10-12 DIAGNOSIS — S82899A Other fracture of unspecified lower leg, initial encounter for closed fracture: Secondary | ICD-10-CM | POA: Diagnosis not present

## 2023-10-13 ENCOUNTER — Ambulatory Visit: Admitting: Surgical

## 2023-10-13 ENCOUNTER — Ambulatory Visit: Payer: Self-pay

## 2023-10-13 NOTE — Telephone Encounter (Signed)
 Needs appt Tuesday afternoon or Wed all day  FYI Only or Action Required?: Action required by provider: request for appointment.  Patient was last seen in primary care on 08/27/2023 by Eben Reyes BROCKS, MD. Called Nurse Triage reporting Leg Swelling. Symptoms began a week ago. Interventions attempted: Nothing. Symptoms are: gradually worsening.  Triage Disposition: See Physician Within 24 Hours  Patient/caregiver understands and will follow disposition?: Yes, will follow disposition  Copied from CRM [id number]. Topic: Clinical - Red Word Triage >> Oct 13, 2023  2:33 PM Cherylann RAMAN wrote: Red Word that prompted transfer to Nurse Triage: Patient calling with complaints with of swelling in both her legs and her feet. Swelling has been happening since last week and has been getting worse. She has a little of redness with the swelling. Reason for Disposition  [1] MODERATE leg swelling (e.g., swelling extends up to knees) AND [2] new-onset or worsening  Answer Assessment - Initial Assessment Questions 1. ONSET: When did the swelling start? (e.g., minutes, hours, days)     About a week ago 2. LOCATION: What part of the leg is swollen?  Are both legs swollen or just one leg?     BLE 3. SEVERITY: How bad is the swelling? (e.g., localized; mild, moderate, severe)   - Localized: Small area of swelling localized to one leg.   - MILD pedal edema: Swelling limited to foot and ankle, pitting edema < 1/4 inch (6 mm) deep, rest and elevation eliminate most or all swelling.   - MODERATE edema: Swelling of lower leg to knee, pitting edema > 1/4 inch (6 mm) deep, rest and elevation only partially reduce swelling.   - SEVERE edema: Swelling extends above knee, facial or hand swelling present.      Moderate, to knee 4. REDNESS: Does the swelling look red or infected?     +redness 5. PAIN: Is the swelling painful to touch? If Yes, ask: How painful is it?   (Scale 1-10; mild, moderate or severe)      3 6. FEVER: Do you have a fever? If Yes, ask: What is it, how was it measured, and when did it start?      denies 7. CAUSE: What do you think is causing the leg swelling?     denies 8. MEDICAL HISTORY: Do you have a history of blood clots (e.g., DVT), cancer, heart failure, kidney disease, or liver failure?     denies 9. RECURRENT SYMPTOM: Have you had leg swelling before? If Yes, ask: When was the last time? What happened that time?     denies 10. OTHER SYMPTOMS: Do you have any other symptoms? (e.g., chest pain, difficulty breathing)       denies  Protocols used: Leg Swelling and Edema-A-AH

## 2023-10-13 NOTE — Telephone Encounter (Signed)
 Pt was called - no answer; left message to call the office for an appt.

## 2023-10-14 ENCOUNTER — Ambulatory Visit: Admitting: Surgical

## 2023-10-14 VITALS — BP 148/72 | HR 74

## 2023-10-14 DIAGNOSIS — T8130XA Disruption of wound, unspecified, initial encounter: Secondary | ICD-10-CM

## 2023-10-14 NOTE — Telephone Encounter (Signed)
 [place] [facility] (Key: [id number]) This request has received a Favorable outcome. PA Case ID #: [id number] Need Help? Call us  at [phone] Outcome Approved on June 27 by [facility] [place]  Medicaid PA Case: [id number], Status: Approved, Coverage Starts on: 10/10/2023 12:00:00 AM, Coverage Ends on: 04/07/2024 12:00:00 AM. Effective Date: 10/10/2023 Authorization Expiration Date: 04/07/2024

## 2023-10-14 NOTE — Progress Notes (Signed)
 Referring Provider Nooruddin, Saad, MD [address] [place], [address] [place],  [place] [address]   CC:  Chief Complaint  Patient presents with   Follow-up      Peggy House is an 63 y.o. female.  HPI: Patient is a 63 year old female here for follow-up on her right lower extremity wounds.  She underwent irrigation and debridement of open fracture and removal of hardware with orthopedics on 07/15/2023, underwent additional debridement on 07/21/2023.  She then underwent debridement with Dr. Lowery on 07/28/2023.  She does have a history of osteomyelitis as well as infection associated with internal fixation device, has had removal of the right tibia prosthesis.  She has been seen by infectious disease.  She reports today that she forgot about her last few appointments.  She does get assistance with transportation from a friend and her friend reports she forgot about yesterday.  She was scheduled to see infectious disease on 10/08/2023, however appointment appears to be canceled.  She was scheduled with our office on 10/08/2023 and subsequently yesterday 10/13/2023, however no showed.  She is here today for reevaluation.  She reports that her dressing was last changed 4 to 5 days ago when she was at her orthopedic doctor's office.  She has not since been seen by home health or had the dressing changed herself at home.  She is still smoking, about 10 cigarettes/day.  She does report that she received clearance from orthopedics to weight-bear on the right foot.  Review of Systems General: No fevers or chills  Physical Exam    10/14/2023   11:17 AM 10/14/2023   11:16 AM 09/24/2023   11:38 AM  Vitals with BMI  Systolic 148 146 830  Diastolic 72 72 72  Pulse  74     General:  No acute distress,  Alert and oriented, Non-Toxic, Normal speech and affect Right lower extremity wound laterally is 9.5 x 2 cm, medial is 4 x 1 cm.  There is some granulation tissue noted throughout the right  lateral leg wound.  There is also some fibrinous exudate present.  There is no surrounding erythema or cellulitic changes.  She does have some Trane tracking scarring from the nylon sutures that were in place.  There is no obvious infection noted on exam.  There is no foul odor noted.  There is no active drainage or purulence noted.  No crepitus.  Distal extremity does have some swelling, however minimal pitting edema is present.  Distal extremities warm to touch.       Assessment/Plan Recommend continuing with dressing changes with collagen.  Discussed with patient the importance of dressing changes occurring every other day.  Discussed with patient that I have concerns that the dressing is not being changed frequently enough, last dressing change was 4 to 5 days ago.  I suspect that this is contributing to the buildup of exudate.  There is no obvious signs of infection.  Recommend continuing to monitor the area for worsening changes.  Discussed with patient that she needs to refrain from smoking to optimize her healing status, discussed with her that the nicotine  use affects her healing rate.  We discussed optimizing her nutritional status with protein shakes and multivitamins if possible.  I would like to see her back in about 7 to 10 days for reevaluation.  We will discharge her from the wound VAC.  Informed patient to return wound VAC.  Pictures were obtained of the patient and placed in the chart  with the patient's or guardian's permission.   Peggy House PARAS Kimbra Marcelino 10/14/2023, 11:47 AM

## 2023-10-23 DIAGNOSIS — Z90710 Acquired absence of both cervix and uterus: Secondary | ICD-10-CM | POA: Diagnosis not present

## 2023-10-23 DIAGNOSIS — D696 Thrombocytopenia, unspecified: Secondary | ICD-10-CM | POA: Diagnosis not present

## 2023-10-23 DIAGNOSIS — F339 Major depressive disorder, recurrent, unspecified: Secondary | ICD-10-CM | POA: Diagnosis not present

## 2023-10-23 DIAGNOSIS — T8131XA Disruption of external operation (surgical) wound, not elsewhere classified, initial encounter: Secondary | ICD-10-CM | POA: Diagnosis not present

## 2023-10-23 DIAGNOSIS — E876 Hypokalemia: Secondary | ICD-10-CM | POA: Diagnosis not present

## 2023-10-23 DIAGNOSIS — I700 Atherosclerosis of aorta: Secondary | ICD-10-CM | POA: Diagnosis not present

## 2023-10-23 DIAGNOSIS — E785 Hyperlipidemia, unspecified: Secondary | ICD-10-CM | POA: Diagnosis not present

## 2023-10-23 DIAGNOSIS — G4700 Insomnia, unspecified: Secondary | ICD-10-CM | POA: Diagnosis not present

## 2023-10-23 DIAGNOSIS — L03115 Cellulitis of right lower limb: Secondary | ICD-10-CM | POA: Diagnosis not present

## 2023-10-23 DIAGNOSIS — E66813 Obesity, class 3: Secondary | ICD-10-CM | POA: Diagnosis not present

## 2023-10-23 DIAGNOSIS — E1151 Type 2 diabetes mellitus with diabetic peripheral angiopathy without gangrene: Secondary | ICD-10-CM | POA: Diagnosis not present

## 2023-10-23 DIAGNOSIS — F17210 Nicotine dependence, cigarettes, uncomplicated: Secondary | ICD-10-CM | POA: Diagnosis not present

## 2023-10-23 DIAGNOSIS — K219 Gastro-esophageal reflux disease without esophagitis: Secondary | ICD-10-CM | POA: Diagnosis not present

## 2023-10-23 DIAGNOSIS — I10 Essential (primary) hypertension: Secondary | ICD-10-CM | POA: Diagnosis not present

## 2023-10-23 DIAGNOSIS — T8141XD Infection following a procedure, superficial incisional surgical site, subsequent encounter: Secondary | ICD-10-CM | POA: Diagnosis not present

## 2023-10-23 DIAGNOSIS — D509 Iron deficiency anemia, unspecified: Secondary | ICD-10-CM | POA: Diagnosis not present

## 2023-10-23 DIAGNOSIS — Z556 Problems related to health literacy: Secondary | ICD-10-CM | POA: Diagnosis not present

## 2023-10-23 DIAGNOSIS — F411 Generalized anxiety disorder: Secondary | ICD-10-CM | POA: Diagnosis not present

## 2023-10-23 DIAGNOSIS — I2721 Secondary pulmonary arterial hypertension: Secondary | ICD-10-CM | POA: Diagnosis not present

## 2023-10-23 DIAGNOSIS — M159 Polyosteoarthritis, unspecified: Secondary | ICD-10-CM | POA: Diagnosis not present

## 2023-10-23 DIAGNOSIS — S82841D Displaced bimalleolar fracture of right lower leg, subsequent encounter for closed fracture with routine healing: Secondary | ICD-10-CM | POA: Diagnosis not present

## 2023-10-23 DIAGNOSIS — Z86718 Personal history of other venous thrombosis and embolism: Secondary | ICD-10-CM | POA: Diagnosis not present

## 2023-10-23 DIAGNOSIS — R32 Unspecified urinary incontinence: Secondary | ICD-10-CM | POA: Diagnosis not present

## 2023-10-24 ENCOUNTER — Other Ambulatory Visit: Payer: Self-pay | Admitting: Hematology

## 2023-10-24 NOTE — Telephone Encounter (Signed)
 Discontinued by Dr. Redell Burnet on July 31, 2023.

## 2023-10-27 ENCOUNTER — Ambulatory Visit: Admitting: Surgical

## 2023-10-27 ENCOUNTER — Encounter: Payer: Self-pay | Admitting: Surgical

## 2023-10-28 ENCOUNTER — Other Ambulatory Visit: Payer: Self-pay | Admitting: Surgical

## 2023-10-28 ENCOUNTER — Encounter: Payer: Self-pay | Admitting: Surgical

## 2023-10-28 DIAGNOSIS — T8130XA Disruption of wound, unspecified, initial encounter: Secondary | ICD-10-CM

## 2023-10-28 DIAGNOSIS — S81801D Unspecified open wound, right lower leg, subsequent encounter: Secondary | ICD-10-CM

## 2023-10-30 DIAGNOSIS — T8141XD Infection following a procedure, superficial incisional surgical site, subsequent encounter: Secondary | ICD-10-CM | POA: Diagnosis not present

## 2023-10-30 DIAGNOSIS — E876 Hypokalemia: Secondary | ICD-10-CM | POA: Diagnosis not present

## 2023-10-30 DIAGNOSIS — I10 Essential (primary) hypertension: Secondary | ICD-10-CM | POA: Diagnosis not present

## 2023-10-30 DIAGNOSIS — E785 Hyperlipidemia, unspecified: Secondary | ICD-10-CM | POA: Diagnosis not present

## 2023-10-30 DIAGNOSIS — D509 Iron deficiency anemia, unspecified: Secondary | ICD-10-CM | POA: Diagnosis not present

## 2023-10-30 DIAGNOSIS — L03115 Cellulitis of right lower limb: Secondary | ICD-10-CM | POA: Diagnosis not present

## 2023-10-30 DIAGNOSIS — M159 Polyosteoarthritis, unspecified: Secondary | ICD-10-CM | POA: Diagnosis not present

## 2023-10-30 DIAGNOSIS — D696 Thrombocytopenia, unspecified: Secondary | ICD-10-CM | POA: Diagnosis not present

## 2023-10-30 DIAGNOSIS — T8130XA Disruption of wound, unspecified, initial encounter: Secondary | ICD-10-CM | POA: Diagnosis not present

## 2023-10-30 DIAGNOSIS — T8131XA Disruption of external operation (surgical) wound, not elsewhere classified, initial encounter: Secondary | ICD-10-CM | POA: Diagnosis not present

## 2023-10-30 DIAGNOSIS — F411 Generalized anxiety disorder: Secondary | ICD-10-CM | POA: Diagnosis not present

## 2023-10-30 DIAGNOSIS — Z86718 Personal history of other venous thrombosis and embolism: Secondary | ICD-10-CM | POA: Diagnosis not present

## 2023-10-30 DIAGNOSIS — I2721 Secondary pulmonary arterial hypertension: Secondary | ICD-10-CM | POA: Diagnosis not present

## 2023-10-30 DIAGNOSIS — S82841D Displaced bimalleolar fracture of right lower leg, subsequent encounter for closed fracture with routine healing: Secondary | ICD-10-CM | POA: Diagnosis not present

## 2023-10-30 DIAGNOSIS — E66813 Obesity, class 3: Secondary | ICD-10-CM | POA: Diagnosis not present

## 2023-10-30 DIAGNOSIS — G4700 Insomnia, unspecified: Secondary | ICD-10-CM | POA: Diagnosis not present

## 2023-10-30 DIAGNOSIS — I700 Atherosclerosis of aorta: Secondary | ICD-10-CM | POA: Diagnosis not present

## 2023-10-30 DIAGNOSIS — F339 Major depressive disorder, recurrent, unspecified: Secondary | ICD-10-CM | POA: Diagnosis not present

## 2023-10-30 DIAGNOSIS — M869 Osteomyelitis, unspecified: Secondary | ICD-10-CM | POA: Diagnosis not present

## 2023-10-30 DIAGNOSIS — T84629A Infection and inflammatory reaction due to internal fixation device of unspecified bone of leg, initial encounter: Secondary | ICD-10-CM | POA: Diagnosis not present

## 2023-10-30 DIAGNOSIS — K219 Gastro-esophageal reflux disease without esophagitis: Secondary | ICD-10-CM | POA: Diagnosis not present

## 2023-10-30 DIAGNOSIS — E1151 Type 2 diabetes mellitus with diabetic peripheral angiopathy without gangrene: Secondary | ICD-10-CM | POA: Diagnosis not present

## 2023-10-30 DIAGNOSIS — M86271 Subacute osteomyelitis, right ankle and foot: Secondary | ICD-10-CM | POA: Diagnosis not present

## 2023-10-30 DIAGNOSIS — Z90710 Acquired absence of both cervix and uterus: Secondary | ICD-10-CM | POA: Diagnosis not present

## 2023-10-30 DIAGNOSIS — R32 Unspecified urinary incontinence: Secondary | ICD-10-CM | POA: Diagnosis not present

## 2023-10-30 DIAGNOSIS — Z556 Problems related to health literacy: Secondary | ICD-10-CM | POA: Diagnosis not present

## 2023-10-30 DIAGNOSIS — F17210 Nicotine dependence, cigarettes, uncomplicated: Secondary | ICD-10-CM | POA: Diagnosis not present

## 2023-11-04 DIAGNOSIS — D509 Iron deficiency anemia, unspecified: Secondary | ICD-10-CM | POA: Diagnosis not present

## 2023-11-04 DIAGNOSIS — E876 Hypokalemia: Secondary | ICD-10-CM | POA: Diagnosis not present

## 2023-11-04 DIAGNOSIS — R32 Unspecified urinary incontinence: Secondary | ICD-10-CM | POA: Diagnosis not present

## 2023-11-04 DIAGNOSIS — E66813 Obesity, class 3: Secondary | ICD-10-CM | POA: Diagnosis not present

## 2023-11-04 DIAGNOSIS — E1151 Type 2 diabetes mellitus with diabetic peripheral angiopathy without gangrene: Secondary | ICD-10-CM | POA: Diagnosis not present

## 2023-11-04 DIAGNOSIS — F339 Major depressive disorder, recurrent, unspecified: Secondary | ICD-10-CM | POA: Diagnosis not present

## 2023-11-04 DIAGNOSIS — F17210 Nicotine dependence, cigarettes, uncomplicated: Secondary | ICD-10-CM | POA: Diagnosis not present

## 2023-11-04 DIAGNOSIS — I700 Atherosclerosis of aorta: Secondary | ICD-10-CM | POA: Diagnosis not present

## 2023-11-04 DIAGNOSIS — E785 Hyperlipidemia, unspecified: Secondary | ICD-10-CM | POA: Diagnosis not present

## 2023-11-04 DIAGNOSIS — I2721 Secondary pulmonary arterial hypertension: Secondary | ICD-10-CM | POA: Diagnosis not present

## 2023-11-04 DIAGNOSIS — Z86718 Personal history of other venous thrombosis and embolism: Secondary | ICD-10-CM | POA: Diagnosis not present

## 2023-11-04 DIAGNOSIS — K219 Gastro-esophageal reflux disease without esophagitis: Secondary | ICD-10-CM | POA: Diagnosis not present

## 2023-11-04 DIAGNOSIS — M159 Polyosteoarthritis, unspecified: Secondary | ICD-10-CM | POA: Diagnosis not present

## 2023-11-04 DIAGNOSIS — F411 Generalized anxiety disorder: Secondary | ICD-10-CM | POA: Diagnosis not present

## 2023-11-04 DIAGNOSIS — D696 Thrombocytopenia, unspecified: Secondary | ICD-10-CM | POA: Diagnosis not present

## 2023-11-04 DIAGNOSIS — G4700 Insomnia, unspecified: Secondary | ICD-10-CM | POA: Diagnosis not present

## 2023-11-04 DIAGNOSIS — Z556 Problems related to health literacy: Secondary | ICD-10-CM | POA: Diagnosis not present

## 2023-11-04 DIAGNOSIS — S82841D Displaced bimalleolar fracture of right lower leg, subsequent encounter for closed fracture with routine healing: Secondary | ICD-10-CM | POA: Diagnosis not present

## 2023-11-04 DIAGNOSIS — I10 Essential (primary) hypertension: Secondary | ICD-10-CM | POA: Diagnosis not present

## 2023-11-04 DIAGNOSIS — L03115 Cellulitis of right lower limb: Secondary | ICD-10-CM | POA: Diagnosis not present

## 2023-11-04 DIAGNOSIS — Z90710 Acquired absence of both cervix and uterus: Secondary | ICD-10-CM | POA: Diagnosis not present

## 2023-11-04 DIAGNOSIS — T8141XD Infection following a procedure, superficial incisional surgical site, subsequent encounter: Secondary | ICD-10-CM | POA: Diagnosis not present

## 2023-11-04 DIAGNOSIS — T8131XA Disruption of external operation (surgical) wound, not elsewhere classified, initial encounter: Secondary | ICD-10-CM | POA: Diagnosis not present

## 2023-11-06 ENCOUNTER — Telehealth: Payer: Self-pay

## 2023-11-06 NOTE — Telephone Encounter (Signed)
 I attempted to call the patient and I attempted to call her friend Nanetta who is an emergency contact who has been with her at her appointments. I left a voicemail for Marcina to call us  back. I wanted to discuss her care and make sure she gets set up with the wound care clinic.

## 2023-11-07 DIAGNOSIS — F17219 Nicotine dependence, cigarettes, with unspecified nicotine-induced disorders: Secondary | ICD-10-CM | POA: Diagnosis not present

## 2023-11-07 DIAGNOSIS — H25812 Combined forms of age-related cataract, left eye: Secondary | ICD-10-CM | POA: Diagnosis not present

## 2023-11-07 DIAGNOSIS — I10 Essential (primary) hypertension: Secondary | ICD-10-CM | POA: Diagnosis not present

## 2023-11-10 DIAGNOSIS — I2721 Secondary pulmonary arterial hypertension: Secondary | ICD-10-CM | POA: Diagnosis not present

## 2023-11-10 DIAGNOSIS — E66813 Obesity, class 3: Secondary | ICD-10-CM | POA: Diagnosis not present

## 2023-11-10 DIAGNOSIS — D696 Thrombocytopenia, unspecified: Secondary | ICD-10-CM | POA: Diagnosis not present

## 2023-11-10 DIAGNOSIS — T8141XD Infection following a procedure, superficial incisional surgical site, subsequent encounter: Secondary | ICD-10-CM | POA: Diagnosis not present

## 2023-11-10 DIAGNOSIS — F17210 Nicotine dependence, cigarettes, uncomplicated: Secondary | ICD-10-CM | POA: Diagnosis not present

## 2023-11-10 DIAGNOSIS — L03115 Cellulitis of right lower limb: Secondary | ICD-10-CM | POA: Diagnosis not present

## 2023-11-10 DIAGNOSIS — T8131XA Disruption of external operation (surgical) wound, not elsewhere classified, initial encounter: Secondary | ICD-10-CM | POA: Diagnosis not present

## 2023-11-10 DIAGNOSIS — I10 Essential (primary) hypertension: Secondary | ICD-10-CM | POA: Diagnosis not present

## 2023-11-10 DIAGNOSIS — K219 Gastro-esophageal reflux disease without esophagitis: Secondary | ICD-10-CM | POA: Diagnosis not present

## 2023-11-10 DIAGNOSIS — E1151 Type 2 diabetes mellitus with diabetic peripheral angiopathy without gangrene: Secondary | ICD-10-CM | POA: Diagnosis not present

## 2023-11-10 DIAGNOSIS — Z556 Problems related to health literacy: Secondary | ICD-10-CM | POA: Diagnosis not present

## 2023-11-10 DIAGNOSIS — G4700 Insomnia, unspecified: Secondary | ICD-10-CM | POA: Diagnosis not present

## 2023-11-10 DIAGNOSIS — S82841D Displaced bimalleolar fracture of right lower leg, subsequent encounter for closed fracture with routine healing: Secondary | ICD-10-CM | POA: Diagnosis not present

## 2023-11-10 DIAGNOSIS — F339 Major depressive disorder, recurrent, unspecified: Secondary | ICD-10-CM | POA: Diagnosis not present

## 2023-11-10 DIAGNOSIS — R32 Unspecified urinary incontinence: Secondary | ICD-10-CM | POA: Diagnosis not present

## 2023-11-10 DIAGNOSIS — I700 Atherosclerosis of aorta: Secondary | ICD-10-CM | POA: Diagnosis not present

## 2023-11-10 DIAGNOSIS — E876 Hypokalemia: Secondary | ICD-10-CM | POA: Diagnosis not present

## 2023-11-10 DIAGNOSIS — Z90710 Acquired absence of both cervix and uterus: Secondary | ICD-10-CM | POA: Diagnosis not present

## 2023-11-10 DIAGNOSIS — E785 Hyperlipidemia, unspecified: Secondary | ICD-10-CM | POA: Diagnosis not present

## 2023-11-10 DIAGNOSIS — M159 Polyosteoarthritis, unspecified: Secondary | ICD-10-CM | POA: Diagnosis not present

## 2023-11-10 DIAGNOSIS — D509 Iron deficiency anemia, unspecified: Secondary | ICD-10-CM | POA: Diagnosis not present

## 2023-11-10 DIAGNOSIS — Z86718 Personal history of other venous thrombosis and embolism: Secondary | ICD-10-CM | POA: Diagnosis not present

## 2023-11-10 DIAGNOSIS — F411 Generalized anxiety disorder: Secondary | ICD-10-CM | POA: Diagnosis not present

## 2023-11-11 ENCOUNTER — Telehealth: Payer: Self-pay

## 2023-11-11 ENCOUNTER — Telehealth: Payer: Self-pay | Admitting: *Deleted

## 2023-11-11 DIAGNOSIS — E785 Hyperlipidemia, unspecified: Secondary | ICD-10-CM

## 2023-11-11 DIAGNOSIS — S82899A Other fracture of unspecified lower leg, initial encounter for closed fracture: Secondary | ICD-10-CM | POA: Diagnosis not present

## 2023-11-11 DIAGNOSIS — I10 Essential (primary) hypertension: Secondary | ICD-10-CM

## 2023-11-11 NOTE — Telephone Encounter (Signed)
 Jeroline returned my call. I informed her that we put the referral in to wound care. I let her know that they have attempted to contact her to set up an appointment but have been unsuccessful in reaching her. I gave her the number to wound care so she can call herself to get set up. Patient conveyed understanding.

## 2023-11-11 NOTE — Progress Notes (Signed)
 Complex Care Management Note Care Guide Note  11/11/2023 Name: Peggy House MRN: [id number] DOB: 1960-05-23   Complex Care Management Outreach Attempts: An unsuccessful telephone outreach was attempted today to offer the patient information about available complex care management services.  Follow Up Plan:  Additional outreach attempts will be made to offer the patient complex care management information and services.   Encounter Outcome:  No Answer  [name]  [facility]  [facility], [facility] Guide  Direct Dial: [phone]  Fax [phone]

## 2023-11-11 NOTE — Progress Notes (Signed)
 Complex Care Management Note  Care Guide Note 11/11/2023 Name: Peggy House MRN: [id number] DOB: 16-May-1960  [name] is a 63 y.o. year old female who sees Nooruddin, Saad, MD for primary care. I reached out to [place] [employer] by phone today to offer complex care management services.  [name] was given information about Complex Care Management services today including:   The Complex Care Management services include support from the care team which includes your Nurse Care Manager, Clinical Social Worker, or Pharmacist.  The Complex Care Management team is here to help remove barriers to the health concerns and goals most important to you. Complex Care Management services are voluntary, and the patient may decline or stop services at any time by request to their care team member.   Complex Care Management Consent Status: Patient agreed to services and verbal consent obtained.   Follow up plan:  Telephone appointment with complex care management team member scheduled for:  11/19/23  Encounter Outcome:  Patient Scheduled  [name]  [facility]  [facility], [facility] Guide  Direct Dial: [phone]  Fax [phone]

## 2023-11-13 ENCOUNTER — Other Ambulatory Visit: Payer: Self-pay

## 2023-11-13 ENCOUNTER — Other Ambulatory Visit: Payer: Self-pay | Admitting: Hematology

## 2023-11-13 ENCOUNTER — Telehealth: Payer: Self-pay

## 2023-11-13 NOTE — Telephone Encounter (Signed)
 Pt called requesting is she could schedule and appt for lab, OV , and Bev, IV Iron , + possible PRBCs.  Stated this nurse will make Dr. Lanny and her Team aware of the pt's request.  Pt verbalized understanding and had no further questions or concerns.

## 2023-11-14 ENCOUNTER — Other Ambulatory Visit: Payer: Self-pay | Admitting: Student

## 2023-11-17 ENCOUNTER — Ambulatory Visit (HOSPITAL_BASED_OUTPATIENT_CLINIC_OR_DEPARTMENT_OTHER): Admitting: Internal Medicine

## 2023-11-19 ENCOUNTER — Telehealth: Payer: Self-pay | Admitting: *Deleted

## 2023-11-19 ENCOUNTER — Encounter: Payer: Self-pay | Admitting: *Deleted

## 2023-11-19 DIAGNOSIS — M25571 Pain in right ankle and joints of right foot: Secondary | ICD-10-CM | POA: Diagnosis not present

## 2023-11-19 NOTE — Patient Instructions (Signed)
 [place] [employer] - I am sorry I was unable to reach you today for our scheduled appointment. I work with Nooruddin, Saad, MD and am calling to support your healthcare needs. Please contact me at [phone] at your earliest convenience. I look forward to speaking with you soon.   Thank you,   Olam Ku, RN, BSN [place]  [facility], [facility] RN Care Manager Direct Dial: [phone]  Fax: [phone]

## 2023-11-24 ENCOUNTER — Telehealth: Payer: Self-pay | Admitting: *Deleted

## 2023-11-24 NOTE — Progress Notes (Signed)
 Complex Care Management Care Guide Note  11/24/2023 Name: ZYKERIA LAGUARDIA MRN: [id number] DOB: 12-Mar-1961  ALLESHA ARONOFF is a 63 y.o. year old female who is a primary care patient of Nooruddin, Saad, MD and is actively engaged with the care management team. I reached out to [place] [employer] by phone today to assist with re-scheduling  with the RN Case Manager.  Follow up plan: Unsuccessful telephone outreach attempt made. A HIPAA compliant phone message was left for the patient providing contact information and requesting a return call.  [name]  [facility]  [facility], [facility] Guide  Direct Dial: [phone]  Fax [phone]

## 2023-11-25 NOTE — Progress Notes (Signed)
 Complex Care Management Care Guide Note  11/25/2023 Name: Peggy House MRN: [id number] DOB: 1961/02/10  Peggy House is a 63 y.o. year old female who is a primary care patient of Nooruddin, Saad, MD and is actively engaged with the care management team. I reached out to [place] [employer] by phone today to assist with re-scheduling  with the RN Case Manager.  Follow up plan: Telephone appointment with complex care management team member scheduled for:  12/19/23  [name]  [facility]  [facility], [facility] Guide  Direct Dial: [phone]  Fax [phone]

## 2023-11-26 ENCOUNTER — Encounter: Payer: Self-pay | Admitting: Hematology

## 2023-11-26 NOTE — Telephone Encounter (Signed)
 Peggy House

## 2023-11-30 DIAGNOSIS — M86271 Subacute osteomyelitis, right ankle and foot: Secondary | ICD-10-CM | POA: Diagnosis not present

## 2023-11-30 DIAGNOSIS — M869 Osteomyelitis, unspecified: Secondary | ICD-10-CM | POA: Diagnosis not present

## 2023-11-30 DIAGNOSIS — T8130XA Disruption of wound, unspecified, initial encounter: Secondary | ICD-10-CM | POA: Diagnosis not present

## 2023-11-30 DIAGNOSIS — T84629A Infection and inflammatory reaction due to internal fixation device of unspecified bone of leg, initial encounter: Secondary | ICD-10-CM | POA: Diagnosis not present

## 2023-12-01 ENCOUNTER — Telehealth: Payer: Self-pay | Admitting: General Surgery

## 2023-12-01 NOTE — Telephone Encounter (Signed)
 Called 3 way with pt on the line, called [phone] Complex Care Management , lvmail to try and see if we could get her would care apt moved up. Pt is running out of supplies

## 2023-12-01 NOTE — Telephone Encounter (Signed)
 Called wound care with pt on the line to help her with getting into wound care sooner for supplies, lvmail for the office to call us  back if poss.

## 2023-12-02 ENCOUNTER — Telehealth: Payer: Self-pay | Admitting: *Deleted

## 2023-12-02 NOTE — Progress Notes (Unsigned)
 Complex Care Management Care Guide Note  12/02/2023 Name: Peggy House MRN: [id number] DOB: Jul 23, 1960  Peggy House is a 63 y.o. year old female who is a primary care patient of Nooruddin, Saad, MD and is actively engaged with the care management team. I reached out to [place] [employer] by phone today to return voicemail.  Follow up plan: Unsuccessful telephone outreach attempt made. A HIPAA compliant phone message was left for the patient providing contact information and requesting a return call.    [name]  [facility]  [facility], [facility] Guide  Direct Dial: [phone]  Fax [phone]

## 2023-12-02 NOTE — Telephone Encounter (Signed)
 RTC to patient message was left that the Clinics had returned her call. Copied from CRM [id number]. Topic: General - Other >> Dec 02, 2023  3:55 PM Peggy House wrote: Reason for CRM: Due to she Pt is Disable issue. She needs Help. PT needs to find a appratment due to son and his girlfriend moving out in October. Need call back [phone]

## 2023-12-03 NOTE — Progress Notes (Signed)
 Complex Care Management Care Guide Note  12/03/2023 Name: AIRYONNA FRANKLYN MRN: [id number] DOB: 06/21/1960  ALEASHA FREGEAU is a 63 y.o. year old female who is a primary care patient of Nooruddin, Saad, MD and is actively engaged with the care management team. I reached out to [place] [employer] by phone today to assist with returning voicemail. No answer left message to call back if needed. No further outreach attempts will be made at this time.   Follow up plan: None   [name]  [facility], [facility] Guide  Direct Dial: [phone]  Fax [phone]

## 2023-12-08 ENCOUNTER — Telehealth: Payer: Self-pay | Admitting: Surgical

## 2023-12-08 NOTE — Telephone Encounter (Signed)
 Patient is calling saying that someone here said that they were going to get in touch with [facility] wound care to get her some supplies till she sees them. Please reach out and advise

## 2023-12-08 NOTE — Telephone Encounter (Signed)
 Upon chart review it looks like she was discharged from our service and referred to the wound care center. This was nearly two months ago. Perhaps she meant to call them?

## 2023-12-09 NOTE — Telephone Encounter (Signed)
 Thank you :)

## 2023-12-12 DIAGNOSIS — S82899A Other fracture of unspecified lower leg, initial encounter for closed fracture: Secondary | ICD-10-CM | POA: Diagnosis not present

## 2023-12-19 ENCOUNTER — Encounter: Payer: Self-pay | Admitting: *Deleted

## 2023-12-19 ENCOUNTER — Telehealth: Payer: Self-pay | Admitting: *Deleted

## 2023-12-19 NOTE — Patient Instructions (Signed)
 [place] [employer] - I am sorry I was unable to reach you today for our scheduled appointment. I work with Nooruddin, Saad, MD and am calling to support your healthcare needs. Please contact me at [phone] at your earliest convenience. I look forward to speaking with you soon.   Thank you,   Olam Ku, RN, BSN [place]  [facility], [facility] RN Care Manager Direct Dial: [phone]  Fax: [phone]

## 2023-12-24 ENCOUNTER — Inpatient Hospital Stay (HOSPITAL_COMMUNITY)
Admission: EM | Admit: 2023-12-24 | Discharge: 2023-12-27 | DRG: 300 | Disposition: A | Discharge status: Home / Self Care | Attending: Internal Medicine | Admitting: Internal Medicine

## 2023-12-24 ENCOUNTER — Encounter (HOSPITAL_COMMUNITY): Payer: Self-pay

## 2023-12-24 ENCOUNTER — Other Ambulatory Visit: Payer: Self-pay

## 2023-12-24 DIAGNOSIS — R42 Dizziness and giddiness: Secondary | ICD-10-CM | POA: Diagnosis not present

## 2023-12-24 DIAGNOSIS — Z888 Allergy status to other drugs, medicaments and biological substances status: Secondary | ICD-10-CM

## 2023-12-24 DIAGNOSIS — G4700 Insomnia, unspecified: Secondary | ICD-10-CM | POA: Diagnosis present

## 2023-12-24 DIAGNOSIS — Z886 Allergy status to analgesic agent status: Secondary | ICD-10-CM

## 2023-12-24 DIAGNOSIS — K219 Gastro-esophageal reflux disease without esophagitis: Secondary | ICD-10-CM | POA: Diagnosis not present

## 2023-12-24 DIAGNOSIS — D649 Anemia, unspecified: Secondary | ICD-10-CM | POA: Diagnosis not present

## 2023-12-24 DIAGNOSIS — I780 Hereditary hemorrhagic telangiectasia: Principal | ICD-10-CM | POA: Diagnosis present

## 2023-12-24 DIAGNOSIS — F413 Other mixed anxiety disorders: Secondary | ICD-10-CM | POA: Diagnosis not present

## 2023-12-24 DIAGNOSIS — R040 Epistaxis: Principal | ICD-10-CM

## 2023-12-24 DIAGNOSIS — Z832 Family history of diseases of the blood and blood-forming organs and certain disorders involving the immune mechanism: Secondary | ICD-10-CM

## 2023-12-24 DIAGNOSIS — Z79899 Other long term (current) drug therapy: Secondary | ICD-10-CM | POA: Diagnosis not present

## 2023-12-24 DIAGNOSIS — F17210 Nicotine dependence, cigarettes, uncomplicated: Secondary | ICD-10-CM | POA: Diagnosis present

## 2023-12-24 DIAGNOSIS — F419 Anxiety disorder, unspecified: Secondary | ICD-10-CM | POA: Diagnosis not present

## 2023-12-24 DIAGNOSIS — E669 Obesity, unspecified: Secondary | ICD-10-CM | POA: Diagnosis not present

## 2023-12-24 DIAGNOSIS — X58XXXA Exposure to other specified factors, initial encounter: Secondary | ICD-10-CM | POA: Diagnosis present

## 2023-12-24 DIAGNOSIS — S82851A Displaced trimalleolar fracture of right lower leg, initial encounter for closed fracture: Secondary | ICD-10-CM | POA: Diagnosis present

## 2023-12-24 DIAGNOSIS — Z560 Unemployment, unspecified: Secondary | ICD-10-CM

## 2023-12-24 DIAGNOSIS — I10 Essential (primary) hypertension: Secondary | ICD-10-CM | POA: Diagnosis not present

## 2023-12-24 DIAGNOSIS — E66813 Obesity, class 3: Secondary | ICD-10-CM | POA: Diagnosis present

## 2023-12-24 DIAGNOSIS — Z833 Family history of diabetes mellitus: Secondary | ICD-10-CM

## 2023-12-24 DIAGNOSIS — F411 Generalized anxiety disorder: Secondary | ICD-10-CM | POA: Diagnosis present

## 2023-12-24 DIAGNOSIS — Z6838 Body mass index (BMI) 38.0-38.9, adult: Secondary | ICD-10-CM | POA: Diagnosis not present

## 2023-12-24 DIAGNOSIS — F339 Major depressive disorder, recurrent, unspecified: Secondary | ICD-10-CM | POA: Diagnosis present

## 2023-12-24 DIAGNOSIS — D62 Acute posthemorrhagic anemia: Secondary | ICD-10-CM | POA: Diagnosis present

## 2023-12-24 DIAGNOSIS — Z91018 Allergy to other foods: Secondary | ICD-10-CM | POA: Diagnosis not present

## 2023-12-24 DIAGNOSIS — E785 Hyperlipidemia, unspecified: Secondary | ICD-10-CM | POA: Diagnosis not present

## 2023-12-24 DIAGNOSIS — E119 Type 2 diabetes mellitus without complications: Secondary | ICD-10-CM | POA: Diagnosis not present

## 2023-12-24 DIAGNOSIS — R531 Weakness: Secondary | ICD-10-CM | POA: Diagnosis not present

## 2023-12-24 LAB — URINALYSIS, ROUTINE W REFLEX MICROSCOPIC
Bacteria, UA: NONE SEEN
Bilirubin Urine: NEGATIVE
Glucose, UA: NEGATIVE mg/dL
Hgb urine dipstick: NEGATIVE
Ketones, ur: NEGATIVE mg/dL
Leukocytes,Ua: NEGATIVE
Nitrite: NEGATIVE
Protein, ur: 30 mg/dL — AB
Specific Gravity, Urine: 1.015 (ref 1.005–1.030)
pH: 5 (ref 5.0–8.0)

## 2023-12-24 LAB — COMPREHENSIVE METABOLIC PANEL WITH GFR
ALT: 7 U/L (ref 0–44)
AST: 12 U/L — ABNORMAL LOW (ref 15–41)
Albumin: 3.1 g/dL — ABNORMAL LOW (ref 3.5–5.0)
Alkaline Phosphatase: 72 U/L (ref 38–126)
Anion gap: 13 (ref 5–15)
BUN: 9 mg/dL (ref 8–23)
CO2: 20 mmol/L — ABNORMAL LOW (ref 22–32)
Calcium: 8.6 mg/dL — ABNORMAL LOW (ref 8.9–10.3)
Chloride: 108 mmol/L (ref 98–111)
Creatinine, Ser: 0.99 mg/dL (ref 0.44–1.00)
GFR, Estimated: >60 mL/min (ref >60)
Glucose, Bld: 134 mg/dL — ABNORMAL HIGH (ref 70–99)
Potassium: 4 mmol/L (ref 3.5–5.1)
Sodium: 141 mmol/L (ref 135–145)
Total Bilirubin: 0.2 mg/dL (ref 0.0–1.2)
Total Protein: 6.2 g/dL — ABNORMAL LOW (ref 6.5–8.1)

## 2023-12-24 LAB — IRON AND TIBC
Iron: <10 ug/dL — ABNORMAL LOW (ref 28–170)
TIBC: 318 ug/dL (ref 250–450)

## 2023-12-24 LAB — CBC WITH DIFFERENTIAL/PLATELET
Basophils Absolute: 0.1 K/uL (ref 0.0–0.1)
Basophils Relative: 1 %
Eosinophils Absolute: 0 K/uL (ref 0.0–0.5)
Eosinophils Relative: 0 %
HCT: 10.4 % — ABNORMAL LOW (ref 36.0–46.0)
Hemoglobin: 2.7 g/dL — CL (ref 12.0–15.0)
Lymphocytes Relative: 2 %
Lymphs Abs: 0.2 K/uL — ABNORMAL LOW (ref 0.7–4.0)
MCH: 19.9 pg — ABNORMAL LOW (ref 26.0–34.0)
MCHC: 26 g/dL — ABNORMAL LOW (ref 30.0–36.0)
MCV: 76.5 fL — ABNORMAL LOW (ref 80.0–100.0)
Monocytes Absolute: 0.3 K/uL (ref 0.1–1.0)
Monocytes Relative: 3 %
Neutro Abs: 10 K/uL — ABNORMAL HIGH (ref 1.7–7.7)
Neutrophils Relative %: 94 %
Platelets: 255 K/uL (ref 150–400)
RBC: 1.36 MIL/uL — ABNORMAL LOW (ref 3.87–5.11)
RDW: 22 % — ABNORMAL HIGH (ref 11.5–15.5)
WBC: 10.6 K/uL — ABNORMAL HIGH (ref 4.0–10.5)
nRBC: 0 % (ref 0.0–0.2)

## 2023-12-24 LAB — APTT: aPTT: 29 s (ref 24–36)

## 2023-12-24 LAB — PREPARE RBC (CROSSMATCH)

## 2023-12-24 LAB — PROTIME-INR
INR: 1.2 (ref 0.8–1.2)
INR: 1.2 (ref 0.8–1.2)
Prothrombin Time: 15.4 s — ABNORMAL HIGH (ref 11.4–15.2)
Prothrombin Time: 15.8 s — ABNORMAL HIGH (ref 11.4–15.2)

## 2023-12-24 LAB — POC OCCULT BLOOD, ED: Fecal Occult Bld: POSITIVE — AB

## 2023-12-24 LAB — HEMOGLOBIN AND HEMATOCRIT, BLOOD
HCT: 11.6 % — ABNORMAL LOW (ref 36.0–46.0)
HCT: 19.2 % — ABNORMAL LOW (ref 36.0–46.0)
Hemoglobin: 3.1 g/dL — CL (ref 12.0–15.0)
Hemoglobin: 5.9 g/dL — CL (ref 12.0–15.0)

## 2023-12-24 MED ORDER — AMLODIPINE BESYLATE 5 MG PO TABS: 5.0000 mg | ORAL_TABLET | Freq: Every day | ORAL | Status: DC

## 2023-12-24 MED ORDER — SENNOSIDES-DOCUSATE SODIUM 8.6-50 MG PO TABS
1.0000 | ORAL_TABLET | Freq: Every day | ORAL | Status: DC
  Administered 2023-12-25 – 2023-12-26 (×3): 1 via ORAL
  Filled 2023-12-24 (×3): qty 1

## 2023-12-24 MED ORDER — ACETAMINOPHEN 500 MG PO TABS
1000.0000 mg | ORAL_TABLET | Freq: Three times a day (TID) | ORAL | Status: DC
Start: 2023-12-24 — End: 2023-12-27
  Administered 2023-12-24 – 2023-12-27 (×9): 1000 mg via ORAL
  Filled 2023-12-24 (×9): qty 2

## 2023-12-24 MED ORDER — LISINOPRIL 20 MG PO TABS
20.0000 mg | ORAL_TABLET | Freq: Every day | ORAL | Status: DC
Start: 2023-12-24 — End: 2023-12-24

## 2023-12-24 MED ORDER — AMLODIPINE BESYLATE 10 MG PO TABS
10.0000 mg | ORAL_TABLET | Freq: Every day | ORAL | Status: DC
  Administered 2023-12-25 – 2023-12-27 (×3): 10 mg via ORAL
  Filled 2023-12-24 (×3): qty 1

## 2023-12-24 MED ORDER — OXYCODONE HCL 5 MG PO TABS
5.0000 mg | ORAL_TABLET | Freq: Four times a day (QID) | ORAL | Status: DC | PRN
  Administered 2023-12-24 – 2023-12-26 (×7): 5 mg via ORAL
  Filled 2023-12-24 (×8): qty 1

## 2023-12-24 MED ORDER — SODIUM CHLORIDE 0.9% IV SOLUTION
Freq: Once | INTRAVENOUS | Status: DC
Start: 2023-12-24 — End: 2023-12-27

## 2023-12-24 MED ORDER — PANTOPRAZOLE SODIUM 40 MG PO TBEC
40.0000 mg | DELAYED_RELEASE_TABLET | Freq: Every day | ORAL | Status: DC
Start: 2023-12-24 — End: 2023-12-27
  Administered 2023-12-24 – 2023-12-27 (×4): 40 mg via ORAL
  Filled 2023-12-24 (×4): qty 1

## 2023-12-24 MED ORDER — AMLODIPINE BESYLATE 5 MG PO TABS
10.0000 mg | ORAL_TABLET | Freq: Every day | ORAL | Status: DC
Start: 2023-12-24 — End: 2023-12-24

## 2023-12-24 MED ORDER — SODIUM CHLORIDE 0.9% IV SOLUTION: Freq: Once | INTRAVENOUS | Status: AC

## 2023-12-24 MED ORDER — LISINOPRIL 20 MG PO TABS
20.0000 mg | ORAL_TABLET | Freq: Every day | ORAL | Status: DC
  Administered 2023-12-25 – 2023-12-27 (×3): 20 mg via ORAL
  Filled 2023-12-24 (×3): qty 1

## 2023-12-24 NOTE — ED Triage Notes (Signed)
 PT BIB [facility] from home after PT had new onset of epistaxis at 0100 this morning. PT states s/s stopped around 0600, but felt weak and dizzy. PT has had episodes similar in past. PT uses wheelchair at home. Recent ankle fracture. PT aox4.   BP 110/80, HR 61 Spo2 99% on RA, CBG 277

## 2023-12-24 NOTE — ED Notes (Signed)
 CN made curtesy call to floor that pt is coming soon.

## 2023-12-24 NOTE — H&P (Cosign Needed Addendum)
 Date: 12/24/2023               Patient Name:  Peggy House MRN: [id number]  DOB: [birth year] Age / Sex: 63 y.o., female   PCP: Nelia Dirks, MD         Medical Service: Internal Medicine Teaching Service         Attending Physician: Dr. Dayton Eastern      First Contact: Rebecka Pion, DO}    Second Contact: Dr. Dirks Nelia, MD          Pager Information: First Contact Pager: [phone]   Second Contact Pager: [phone]   SUBJECTIVE   Chief Complaint: Nose bleed and weakness  History of Present Illness: Peggy House is a 63 year old female with a past medical history of hereditary hemorrhagic telangiectasia, recurrent epistaxis, essential hypertension, and class III obesity, who presented to the ED due to a significant nosebleed and generalized weakness that began around 1 AM this morning.  The patient reports that she was in her usual state of health prior to going to sleep last night. At approximately 1 AM, she awoke to a profuse nosebleed described as continuous "like a running tap," which persisted for several hours. She subsequently developed generalized weakness and called EMS, who transported her to the hospital.  She has a known history of recurrent epistaxis and chronic anemia, previously requiring hospitalization and blood transfusions.She states this episode is similar to prior nosebleeds, involving both nostrils, and is spontaneous without any identifiable triggers. She attempted to control the bleeding by applying pressure to her nose, but this was ineffective. She also reports having one episode of dark stool last Thursday but denies any ongoing gastrointestinal bleeding or bleeding from other sites.  She denies any recent trauma to the head and is not on anticoagulant or antiplatelet medications. Additionally, she denies chest pain, shortness of breath, palpitations, nausea, vomiting, hematemesis, fever, chills, nose picking, or recurrent  sneezing.   ED Course: Received : 3 units of RBCs ordered. Consulted: IMTS  Meds:  Patient reported: Trazodone  Senna -docusate Protonix  Robaxin  Amlodipine  Lisinopril  Gabapentin  Celexa    Past Medical History Hereditary hemorrhagic telangiectasis Hypertension Hyperlipidemia Major depressive disorder Obesity Type 2 diabetes with vascular disease GERD Anxiety Arthritis  Past Surgical History Abdominal hysterectomy Enteroscopy ORIF right ankle   Social:  Lives With: At home with son Occupation: Unemployed Support: Get support from her husband Social Security Level of Function: Independent in all ADLs and IADLs PCP: Nooruddin, Saad, MD  Substances: -Tobacco: 1/2 pack  -Alcohol: Denies -Recreational Drug: Denies  Family History:  Diabetes, kidney disease, bleeding disorder in the mother Kidney disease and bleeding disorder in her son Bleeding disorder in her sister   Allergies: Allergies as of 12/24/2023 - Review Complete 12/24/2023  Allergen Reaction Noted   Feraheme  [ferumoxytol ] Other (See Comments) 10/09/2017   Nsaids Other (See Comments) 04/25/2014   Wasp venom Anaphylaxis 10/06/2017   Tomato Hives 01/01/2012   Iron  (ferrous sulfate ) [ferrous sulfate  er] Other (See Comments) 03/07/2020    Review of Systems: A complete ROS was negative except as per HPI.   OBJECTIVE:   Physical Exam: Blood pressure 136/68, pulse 60, temperature 97.9 F (36.6 C), temperature source Oral, resp. rate 19, height 5' 4 (1.626 m), weight 102.1 kg, SpO2 100%.  Patient is ill-appearing, laying in bed in no acute distress No signs of trauma to the head but I could appreciate dried blood from nostrils bilaterally and around her mouth. No nasal polyps .Didn't appreciate septal  perforation  Pale conjunctive eyes noted Cardiac exam shows normal rate regular rhythm. No murmurs  Pulmonary exam unremarkable with clear sounds to ascultation bilaterally . Normal effort Abdomen  is soft and nontender No lower extremity swelling noted Skin is warm and dry.  Her right foot is dressed in clean, dry and intact dressing with no surrounding erythema or warmth. 3/5 strength both upper and lower extremities. She is alert and oriented to time place and person   Labs significant for mild leukocytosis of borderline of 10.6 with hemoglobin of 2.7>>3.1  CMP: Normal electrolytes and kidney function, with mild to decrease AST of 12 PTT time of 15.4 but normal INR of 1.2 Fecal occult blood test was positive  ASSESSMENT & PLAN:   Assessment & Plan by Problem: Principal Problem:   Anemia due to acute blood loss secondary to Epistaxis Active Problems:   HLD (hyperlipidemia)   Essential hypertension   Obesity, Class III, BMI 40-49.9 (morbid obesity)   Insomnia   Major depressive disorder, recurrent episode (HCC)   GAD (generalized anxiety disorder)   Hereditary hemorrhagic telangiectasia (HCC)   Closed trimalleolar fracture of right ankle   Peggy House is a 63 y.o. female with hereditary hemorrhagic telangiectasia, recurrent epistaxis, and essential hypertension presented with active nosebleed and generalized weakness, found to have severe anemia (Hgb 3), and  admitted for acute blood loss anemia due to epistaxis.  #Anemia due to acute blood loss secondary to epistaxis #Hereditary hemorrhagic telangiectasis This patient, with a history of anemia and epistaxis, presents with a similar but more profuse episode of bleeding that has led to generalized weakness and necessitated admission. The acute blood loss is attributed to her heavy epistaxis. On examination, no nasal polyps or septal perforations were observed, and she denies any recent trauma. There is no history of nasal instrumentation or recent ENT surgeries that might explain her recurrent bleeding. Given her diagnosis of hereditary hemorrhagic telangiectasia, which is managed with biweekly bevacizumab  and IV iron   under hematology/oncology care (Dr. Lanny), this likely contributes to her bleeding episodes. Fortunately, her bleeding has ceased. However, I remain concerned about a potential underlying anatomical abnormality that may require further intervention. Considering her recurrent, refractory epistaxis despite conservative management, an outpatient ENT consultation would likely be beneficial especially with the 2.4 hgb this time.  - Transfuse packed red blood cells - Transfuse protocol ,Maintain Hgb > 7 - Trend H&H - SCD for VTE prophylaxis - Monitor for signs of ongoing blood loss - Oxygen therapy if needed  - P/OT - Protonix   - Iron  studies   #Closed trimalleolar fracture of the right ankle I examined the injury site, which is covered with a dry, clean, and intact dressing. The patient reports mild pain at the site currently. I will continue to monitor  and manage her pain as needed. - Tylenol  for pain control - Oxycodone  5 mg oral every 6 hours PRN for severe pain   #T2DM  #Class III obesity  BMI 38.62. Last Hgb A1c ten  month ago was well controlled at 5. No active concerns at this time. Will continue to encourage lifestyle modification   #Essential hypertension BP of 142/68. Will resume home Lisinopril  20 mg daily and amlodipine  5 mg daily   #General Anxiety disorder #Major depressive disorder - Resume home Celexa  20 mg daily  - Resume home Trazodone  50 mg at bedtime   Best practice: Diet: NPO VTE: SCDs IVF: None,None Code: Full  Disposition planning: Prior to Admission Living Arrangement:  Home, living with son  Anticipated Discharge Location: pending further evaluation   Dispo: Admit patient to Inpatient with expected length of stay greater than 2 midnights.  Signed: Renne Homans, MD Internal Medicine Resident  12/24/2023, 3:56 PM  On Call pager: [phone]

## 2023-12-24 NOTE — ED Notes (Signed)
 APTT and CBC recollected

## 2023-12-24 NOTE — ED Notes (Signed)
 Transporter called to have pt transported to floor

## 2023-12-24 NOTE — ED Notes (Signed)
 Assuming care of pt at this time. Pt is AAOx4. Denies any needs at this time.

## 2023-12-24 NOTE — ED Notes (Signed)
 CCMD called for tele monitoring

## 2023-12-24 NOTE — Hospital Course (Addendum)
 Stable vitals Afebrile Here for nose bleed (epistaxis) bilateral ED ordered 3 units of blood Will need ENT consult for Recurrent or unexplained bleeds   ???????No trauma. Takes no blood thinners. No chest pain or shortness of breath. No nausea vomit diarrhea. No dysuria. Just endorses fatigue after this large nosebleed. Has not been experience any kind of hematemesis or hematochezia.   Positive FOBT Acute blood loss Hgb of 3.7  Patient reports she was well last night with no concerns before she went to sleep. She woke up around around 1am with her nose pouring blood like a  tap and then she felt very week afterwards. She also reported dark stool on Thursday ( once ) but her subsequent stools were normal. She denies any trauma to the head and not on any blood thinners. She has no chest pain, shortness of breath , palpitation , no n/v, hematemesis ,no fever or chills. Denied picking her nose, recurrent sneezing   She has recurrent nose bleeds in the past ,requiring hospitalization and blood transfusions.   No alcohols or any illicit drugs. Lives with younger son Disabled but get support from her late husband's SSN.  Recent ankle fracture   EXAM  No nasal polyps  Didn't appreciate septal perforation Dry blood around nostrils and mouth    CBC - anemia, thrombocytopenia  Coagulation panel - PT/INR, aPTT  Type and crossmatch - if heavy bleeding  Liver function tests - if liver disease suspected  von Willebrand panel - if inherited disorder suspected    9/11: feeling sore; hurting in ankle and lower part of right side and back - this isn't new for her  she came in this morning. Did not have anymore nosebleeds last night. She had dark stools last week but none overnight. Hasn't thrown up. Feeling a bit better than yesterday. Reports her aunt had her nose vasculature cauterized and it exacerbated her nosebleeds. She is okay with referral to ENT specialist.

## 2023-12-24 NOTE — ED Provider Notes (Signed)
 [place] EMERGENCY DEPARTMENT AT [facility] Provider Note   CSN: [id number] Arrival date & time: 12/24/23  9255     Patient presents with: Epistaxis   Peggy House is a 63 y.o. female.    Epistaxis    Patient presents with epistaxis.  Patient states that epistaxis started around 1 AM last night.  Subsequently resolved around 6 AM.  For like she is bleeding from both nostrils.  Has had epistaxis in the past.  Patient states that decided to come to the emergency room because ongoing fatigue and weakness after the large degree of epistaxis.  Does not feel like it is running down the back of her throat right now.  No trauma.  Takes no blood thinners.  No chest pain or shortness of breath.  No nausea vomit diarrhea.  No dysuria.  Just endorses fatigue after this large nosebleed.  Has not been experience any kind of hematemesis or hematochezia.  Previous medical history reviewed : Patient has a history of hereditary hemorrhagic telangiectasias.  Frequent episodes of mucosal bleeding.   Prior to Admission medications   Medication Sig Start Date End Date Taking? Authorizing Provider  Accu-Chek Softclix Lancets lancets Use to check blood sugar before breakfast and before dinner while on steroids Patient taking differently: 1 each by Other route See admin instructions. Use to check blood sugar before breakfast and before dinner while on steroids 09/27/19   Arminda Daved HERO, MD  acetaminophen  (TYLENOL ) 325 MG tablet Take 2 tablets (650 mg total) by mouth every 6 (six) hours as needed. 05/19/23   Raulkar, Sven SQUIBB, MD  aminocaproic  acid (AMICAR ) 500 MG tablet Take 2 tablets (1,000 mg total) by mouth every 12 (twelve) hours. 02/21/23   Hanford Powell FORBES, NP  amLODipine  (NORVASC ) 10 MG tablet Take 1 tablet (10 mg total) by mouth daily. 06/10/23 06/09/24  Gregary Sharper, MD  cephALEXin  (KEFLEX ) 500 MG capsule Take 1 capsule (500 mg total) by mouth 4 (four) times daily. 08/27/23    Eben Reyes BROCKS, MD  citalopram  (CELEXA ) 20 MG tablet TAKE 1 TABLET(20 MG) BY MOUTH DAILY 08/27/23   Nooruddin, Saad, MD  gabapentin  (NEURONTIN ) 100 MG capsule Take 1 capsule (100 mg total) by mouth at bedtime. 07/04/23   Raulkar, Sven SQUIBB, MD  glucose blood (ACCU-CHEK GUIDE) test strip Check blood sugar 2 times per day while on steroids before breakfast and dinner Patient taking differently: 1 each by Other route See admin instructions. Check blood sugar 2 times per day while on steroids before breakfast and dinner 09/27/19   Arminda Daved HERO, MD  lisinopril  (ZESTRIL ) 20 MG tablet Take 1 tablet (20 mg total) by mouth daily. 06/23/23   Nooruddin, Saad, MD  methocarbamol  (ROBAXIN -750) 750 MG tablet Take 1 tablet (750 mg total) by mouth every 8 (eight) hours as needed for muscle spasms. 08/13/23   [name], DO  pantoprazole  (PROTONIX ) 40 MG tablet TAKE 1 TABLET(40 MG) BY MOUTH TWICE DAILY Patient taking differently: Take 40 mg by mouth daily. 09/30/22   Lanny Callander, MD  potassium chloride  SA (KLOR-CON  M) 20 MEQ tablet Take 1 tablet (20 mEq total) by mouth daily. 08/13/23   [name], DO  PROAIR  HFA 108 (90 Base) MCG/ACT inhaler INHALE 1 TO 2 PUFFS INTO THE LUNGS EVERY 6 HOURS AS NEEDED FOR WHEEZING OR SHORTNESS OF BREATH Patient taking differently: Inhale 1-2 puffs into the lungs every 6 (six) hours as needed for wheezing or shortness of breath. 11/10/20   Lanny,  Onita, MD  senna-docusate (SENOKOT-S) 8.6-50 MG tablet Take 1 tablet by mouth at bedtime. 05/19/23   Raulkar, Sven SQUIBB, MD  traZODone  (DESYREL ) 50 MG tablet TAKE 1 TABLET(50 MG) BY MOUTH AT BEDTIME AS NEEDED FOR SLEEP 11/14/23   Nooruddin, Saad, MD    Allergies: Feraheme  [ferumoxytol ], Nsaids, Wasp venom, Tomato, and Iron  (ferrous sulfate ) [ferrous sulfate  er]    Review of Systems  HENT:  Positive for nosebleeds.     Updated Vital Signs BP 136/68   Pulse 60   Temp 97.9 F (36.6 C) (Oral)   Resp 19   Ht 5' 4 (1.626 m)   Wt 102.1 kg    SpO2 100%   BMI 38.62 kg/m   Physical Exam Vitals and nursing note reviewed.  Constitutional:      General: She is not in acute distress.    Appearance: She is well-developed.  HENT:     Head: Normocephalic and atraumatic.  Eyes:     Conjunctiva/sclera: Conjunctivae normal.  Cardiovascular:     Rate and Rhythm: Normal rate and regular rhythm.     Heart sounds: No murmur heard. Pulmonary:     Effort: Pulmonary effort is normal. No respiratory distress.     Breath sounds: Normal breath sounds.  Abdominal:     Palpations: Abdomen is soft.     Tenderness: There is no abdominal tenderness.  Musculoskeletal:        General: No swelling.     Cervical back: Neck supple.  Skin:    General: Skin is warm and dry.     Capillary Refill: Capillary refill takes less than 2 seconds.  Neurological:     Mental Status: She is alert.  Psychiatric:        Mood and Affect: Mood normal.     (all labs ordered are listed, but only abnormal results are displayed) Labs Reviewed  PROTIME-INR - Abnormal; Notable for the following components:      Result Value   Prothrombin Time 15.4 (*)    All other components within normal limits  COMPREHENSIVE METABOLIC PANEL WITH GFR - Abnormal; Notable for the following components:   CO2 20 (*)    Glucose, Bld 134 (*)    Calcium  8.6 (*)    Total Protein 6.2 (*)    Albumin 3.1 (*)    AST 12 (*)    All other components within normal limits  PROTIME-INR - Abnormal; Notable for the following components:   Prothrombin Time 15.8 (*)    All other components within normal limits  CBC WITH DIFFERENTIAL/PLATELET - Abnormal; Notable for the following components:   WBC 10.6 (*)    RBC 1.36 (*)    Hemoglobin 2.7 (*)    HCT 10.4 (*)    MCV 76.5 (*)    MCH 19.9 (*)    MCHC 26.0 (*)    RDW 22.0 (*)    Neutro Abs 10.0 (*)    Lymphs Abs 0.2 (*)    All other components within normal limits  HEMOGLOBIN AND HEMATOCRIT, BLOOD - Abnormal; Notable for the following  components:   Hemoglobin 3.1 (*)    HCT 11.6 (*)    All other components within normal limits  POC OCCULT BLOOD, ED - Abnormal; Notable for the following components:   Fecal Occult Bld POSITIVE (*)    All other components within normal limits  APTT  CBC WITH DIFFERENTIAL/PLATELET  URINALYSIS, ROUTINE W REFLEX MICROSCOPIC  IRON  AND TIBC  TYPE AND SCREEN  PREPARE  RBC (CROSSMATCH)    EKG: EKG Interpretation Date/Time:  Wednesday December 24 2023 07:52:29 EDT Ventricular Rate:  68 PR Interval:  183 QRS Duration:  91 QT Interval:  452 QTC Calculation: 481 R Axis:   -8  Text Interpretation: Sinus rhythm Confirmed by Simon Rea [phone]) on 12/24/2023 9:40:31 AM  Radiology: No results found.   Procedures   Medications Ordered in the ED  0.9 %  sodium chloride  infusion (Manually program via Guardrails IV Fluids) ( Intravenous New Bag/Given 12/24/23 1257)                                    Medical Decision Making Amount and/or Complexity of Data Reviewed Labs: ordered.  Risk Prescription drug management. Decision regarding hospitalization.     Patient presents with epistaxis.  Patient states that epistaxis started around 1 AM last night.  Subsequently resolved around 6 AM.  For like she is bleeding from both nostrils.  Has had epistaxis in the past.  Patient states that decided to come to the emergency room because ongoing fatigue and weakness after the large degree of epistaxis.  Does not feel like it is running down the back of her throat right now.  No trauma.  Takes no blood thinners.  No chest pain or shortness of breath.  No nausea vomit diarrhea.  No dysuria.  Just endorses fatigue after this large nosebleed.  Has not been experience any kind of hematemesis or hematochezia.  Previous medical history reviewed : Patient has a history of hereditary hemorrhagic telangiectasias.  Frequent episodes of mucosal bleeding.  On exam, patient hemodynamically stable.  ANO x 3  GCS 15.  Maps appropriate.  Afebrile.  100 percent on room air.  Soft benign abdomen.  Obtain laboratory workup.  Hemoglobin found to be around 2.73.  Did drop significantly compared to baseline.  Will add on iron  and total binding capacity.  I think this is all related to epistaxis.  No ongoing epistaxis.  No indication for packing at this point in time.  No reversal agents needed this point time given no anticoagulation use.  Did consent the patient.  Initially ordered 3 units of packed red blood cells.  INR normal.  Platelets normal.  Can reassess after the 3 units.  Maps have remained appropriate.   Patient did report possible episode of dark stool last week.  Stool here was brown.  No bright red blood per rectum.  No dark melena.  Did send off a stool guaiac test which did show positive fecal occult testing but unclear etiology and unclear significance of this given that the stool was brown.  I think more likely she is is having anemia from her pretty dramatic nosebleed.   Patient will be admitted for serial hemoglobin checks and ongoing blood resuscitation.  CRITICAL CARE Performed by: Rea LOISE Simon   Total critical care time: 45 minutes  Critical care time was exclusive of separately billable procedures and treating other patients.  Critical care was necessary to treat or prevent imminent or life-threatening deterioration.  Critical care was time spent personally by me on the following activities: development of treatment plan with patient and/or surrogate as well as nursing, discussions with consultants, evaluation of patient's response to treatment, examination of patient, obtaining history from patient or surrogate, ordering and performing treatments and interventions, ordering and review of laboratory studies, ordering and review of radiographic studies, pulse oximetry and  re-evaluation of patient's condition.       Final diagnoses:  Epistaxis  Anemia, unspecified type     ED Discharge Orders     None          Simon Lavonia SAILOR, MD 12/24/23 1319

## 2023-12-24 NOTE — Progress Notes (Signed)
 12/24/2023 2240 Received pt to room 6E-23 from ED with DX of nose bleed.  Pt is A&Ox4, no C/O voiced.  Tele monitor applied and CCMD notified.  Oriented to room, call light and bed.  Call bell in reach.

## 2023-12-25 DIAGNOSIS — R040 Epistaxis: Secondary | ICD-10-CM | POA: Diagnosis not present

## 2023-12-25 DIAGNOSIS — D62 Acute posthemorrhagic anemia: Secondary | ICD-10-CM | POA: Diagnosis not present

## 2023-12-25 DIAGNOSIS — E66813 Obesity, class 3: Secondary | ICD-10-CM | POA: Diagnosis not present

## 2023-12-25 DIAGNOSIS — I10 Essential (primary) hypertension: Secondary | ICD-10-CM | POA: Diagnosis not present

## 2023-12-25 DIAGNOSIS — F329 Major depressive disorder, single episode, unspecified: Secondary | ICD-10-CM

## 2023-12-25 DIAGNOSIS — I780 Hereditary hemorrhagic telangiectasia: Secondary | ICD-10-CM | POA: Diagnosis not present

## 2023-12-25 DIAGNOSIS — S82851A Displaced trimalleolar fracture of right lower leg, initial encounter for closed fracture: Secondary | ICD-10-CM | POA: Diagnosis not present

## 2023-12-25 DIAGNOSIS — E119 Type 2 diabetes mellitus without complications: Secondary | ICD-10-CM | POA: Diagnosis not present

## 2023-12-25 LAB — HEMOGLOBIN AND HEMATOCRIT, BLOOD
HCT: 25.3 % — ABNORMAL LOW (ref 36.0–46.0)
Hemoglobin: 8.1 g/dL — ABNORMAL LOW (ref 12.0–15.0)

## 2023-12-25 LAB — BASIC METABOLIC PANEL WITH GFR
Anion gap: 11 (ref 5–15)
BUN: 8 mg/dL (ref 8–23)
CO2: 20 mmol/L — ABNORMAL LOW (ref 22–32)
Calcium: 8.3 mg/dL — ABNORMAL LOW (ref 8.9–10.3)
Chloride: 110 mmol/L (ref 98–111)
Creatinine, Ser: 0.84 mg/dL (ref 0.44–1.00)
GFR, Estimated: >60 mL/min (ref >60)
Glucose, Bld: 119 mg/dL — ABNORMAL HIGH (ref 70–99)
Potassium: 3.9 mmol/L (ref 3.5–5.1)
Sodium: 141 mmol/L (ref 135–145)

## 2023-12-25 LAB — CBC
HCT: 20.6 % — ABNORMAL LOW (ref 36.0–46.0)
Hemoglobin: 6.6 g/dL — CL (ref 12.0–15.0)
MCH: 24 pg — ABNORMAL LOW (ref 26.0–34.0)
MCHC: 32 g/dL (ref 30.0–36.0)
MCV: 74.9 fL — ABNORMAL LOW (ref 80.0–100.0)
Platelets: 257 K/uL (ref 150–400)
RBC: 2.75 MIL/uL — ABNORMAL LOW (ref 3.87–5.11)
RDW: 20.7 % — ABNORMAL HIGH (ref 11.5–15.5)
WBC: 11.2 K/uL — ABNORMAL HIGH (ref 4.0–10.5)
nRBC: 0.3 % — ABNORMAL HIGH (ref 0.0–0.2)

## 2023-12-25 LAB — BPAM PLATELET PHERESIS
Blood Product Expiration Date: 202509122359
Unit Type and Rh: 5100

## 2023-12-25 LAB — PREPARE PLATELET PHERESIS: Unit division: 0

## 2023-12-25 LAB — PREPARE RBC (CROSSMATCH)

## 2023-12-25 MED ORDER — OXYMETAZOLINE HCL 0.05 % NA SOLN
1.0000 | Freq: Two times a day (BID) | NASAL | Status: DC | PRN
  Filled 2023-12-25: qty 30

## 2023-12-25 MED ORDER — SALINE SPRAY 0.65 % NA SOLN
1.0000 | Freq: Two times a day (BID) | NASAL | Status: DC
  Administered 2023-12-25 – 2023-12-27 (×5): 1 via NASAL
  Filled 2023-12-25: qty 44

## 2023-12-25 MED ORDER — SODIUM CHLORIDE 0.9% IV SOLUTION: Freq: Once | INTRAVENOUS | Status: AC

## 2023-12-25 MED ORDER — OXYMETAZOLINE HCL 0.05 % NA SOLN
1.0000 | Freq: Two times a day (BID) | NASAL | Status: AC
  Administered 2023-12-25 – 2023-12-26 (×4): 1 via NASAL
  Filled 2023-12-25: qty 30

## 2023-12-25 MED ORDER — MEDIHONEY WOUND/BURN DRESSING EX PSTE
1.0000 | PASTE | Freq: Every day | CUTANEOUS | Status: DC
  Administered 2023-12-26 – 2023-12-27 (×3): 1 via TOPICAL
  Filled 2023-12-25: qty 44

## 2023-12-25 NOTE — Progress Notes (Signed)
 HD#1 SUBJECTIVE:  Patient Summary: Peggy House is a 63 y.o. female with hereditary hemorrhagic telangiectasia, recurrent epistaxis, and essential hypertension presented with active nosebleed and generalized weakness, found to have severe anemia (Hgb 3), and admitted for acute blood loss anemia due to epistaxis .   Overnight Events: Required two transfusions for hgb<7  Interim History:  Saw pt this AM. She was feeling sore. She was hurting in ankle and lower part of right side and back - this isn't new for her. Did not have anymore nosebleeds last night. She had dark stools last week but none overnight. Hasn't thrown up. Feeling a bit better than yesterday. Reports her aunt had her nose vasculature cauterized and it exacerbated her nosebleeds. She is okay with referral to ENT specialist.   OBJECTIVE:  Vital Signs: Vitals:   12/25/23 1145 12/25/23 1157 12/25/23 1315 12/25/23 1415  BP:  126/60  (!) 145/72  Pulse:  67 64 71  Resp:  16 17 (!) 26  Temp: 98 F (36.7 C) 98.6 F (37 C)    TempSrc:  Oral    SpO2:  99%  99%  Weight:      Height:       Supplemental O2: Room Air SpO2: 99 %  Filed Weights   12/24/23 0753 12/24/23 2243  Weight: 102.1 kg 103.1 kg     Intake/Output Summary (Last 24 hours) at 12/25/2023 1438 Last data filed at 12/25/2023 1430 Gross per 24 hour  Intake 2960 ml  Output --  Net 2960 ml   Net IO Since Admission: 4,385 mL [12/25/23 1438]  Physical Exam: Physical Exam HENT:     Head: Normocephalic.     Nose: No nasal deformity or septal deviation.     Comments: No blood in and around the nose Cardiovascular:     Rate and Rhythm: Normal rate and regular rhythm.  Pulmonary:     Effort: Pulmonary effort is normal.     Breath sounds: Normal breath sounds.  Musculoskeletal:     Comments: Right ankle covered with dressing   Skin:    General: Skin is warm.  Neurological:     Mental Status: She is alert.     Patient Lines/Drains/Airways  Status     Active Line/Drains/Airways     Name Placement date Placement time Site Days   Peripheral IV 12/24/23 20 G Anterior;Proximal;Right Forearm 12/24/23  0814  Forearm  1   Peripheral IV 12/24/23 20 G Anterior;Left;Proximal Forearm 12/24/23  1005  Forearm  1   Wound 12/24/23 2300 Other (Comment) Pretibial Right;Lateral 12/24/23  2300  Pretibial  1   Wound 12/24/23 2300 Surgical Ankle Anterior;Right 12/24/23  2300  Ankle  1            Pertinent labs and imaging:      Latest Ref Rng & Units 12/25/2023    4:26 AM 12/24/2023    9:19 PM 12/24/2023   10:07 AM  CBC  WBC 4.0 - 10.5 K/uL 11.2     Hemoglobin 12.0 - 15.0 g/dL 6.6  5.9  3.1   Hematocrit 36.0 - 46.0 % 20.6  19.2  11.6   Platelets 150 - 400 K/uL 257          Latest Ref Rng & Units 12/25/2023    4:26 AM 12/24/2023    8:00 AM 08/07/2023   11:33 AM  CMP  Glucose 70 - 99 mg/dL 880  865  98   BUN 8 - 23 mg/dL 8  9  6   Creatinine 0.44 - 1.00 mg/dL 9.15  9.00  9.31   Sodium 135 - 145 mmol/L 141  141  141   Potassium 3.5 - 5.1 mmol/L 3.9  4.0  3.4   Chloride 98 - 111 mmol/L 110  108  108   CO2 22 - 32 mmol/L 20  20  29    Calcium  8.9 - 10.3 mg/dL 8.3  8.6  8.7   Total Protein 6.5 - 8.1 g/dL  6.2  7.2   Total Bilirubin 0.0 - 1.2 mg/dL  0.2  0.3   Alkaline Phos 38 - 126 U/L  72  73   AST 15 - 41 U/L  12  9   ALT 0 - 44 U/L  7  <5     No results found.  ASSESSMENT/PLAN:  Assessment: Principal Problem:   Anemia due to acute blood loss secondary to Epistaxis Active Problems:   HLD (hyperlipidemia)   Essential hypertension   Obesity, Class III, BMI 40-49.9 (morbid obesity)   Insomnia   Major depressive disorder, recurrent episode (HCC)   GAD (generalized anxiety disorder)   Hereditary hemorrhagic telangiectasia (HCC)   Closed trimalleolar fracture of right ankle   Plan: #Anemia due to acute blood loss secondary to epistaxis #Hereditary hemorrhagic telangiectasis  Pt's Fe levels were <10. Holding off on IV  iron  infusions as she received 5 transfusions. Pt was feeling better today. However, had several episodes of nose bleeds that were managed with pressure in the area along with Afrin and saline use. Gave pt FFP and Platelets as she had >4 transfusions.  -FFP and platelet infusion -Repeat Hgb 8.1 after 5 transfusions -Bleeding controlled with pressure and Afrin and saline use -Will consider ENT referral if bleeding is not stopping  #Closed trimalleolar fracture of the right ankle Right ankle was covered in dressing. Will give tylenol  and oxycodone  for pain control.  - Tylenol  for pain control - Oxycodone  5 mg oral every 6 hours PRN for severe pain    #T2DM  #Class III obesity  Last A1c at 5.0.  Will continue to encourage lifestyle modification    #Essential hypertension BP today at 147/66. Continue home Lisinopril  20 mg daily and amlodipine  5 mg daily    #General Anxiety disorder #Major depressive disorder - Continue home Celexa  20 mg daily  - Continue home Trazodone  50 mg at bedtime   Best Practice: Diet: Regular  VTE: SCDs IVF: None,None Code: Full   Disposition planning: Prior to Admission Living Arrangement: Home, living with son  Anticipated Discharge Location: pending further evaluation    Dispo: Admit patient to Inpatient with expected length of stay greater than 2 midnights.  Signature:  [name] [name]  2:38 PM, 12/25/2023  On Call pager [phone]

## 2023-12-25 NOTE — TOC Initial Note (Signed)
 Transition of Care [facility]) - Initial/Assessment Note    Patient Details  Name: Peggy House MRN: [id number] Date of Birth: 11/28/1960  [facility] CM/SW Contact:    Sudie Erminio Deems, RN Phone Number: 12/25/2023, 12:24 PM  Clinical Narrative:  Patient presented for nose bleed and weakness. PTA patient was from home with support of her son. Patient has DME rolling walker and wheelchair in the home. Patient states her neighbor takes her to all appointments. Previously patient was being seen by plastic surgery; however, she has been since discharged and a new referral was submitted to the wound care center [facility] Appointment is scheduled for 01-01-24  @ 9:00 am. Inpatient Case Manager will continue to follow for additional disposition needs as the patient progresses.    Expected Discharge Plan: Home/Self Care Barriers to Discharge: No Barriers Identified   Patient Goals and CMS Choice Patient states their goals for this hospitalization and ongoing recovery are:: plans to returnt home with son          Expected Discharge Plan and Services In-house Referral: NA Discharge Planning Services: CM Consult Post Acute Care Choice: NA Living arrangements for the past 2 months: Apartment                   DME Agency: NA                  Prior Living Arrangements/Services Living arrangements for the past 2 months: Apartment Lives with:: Adult Children Patient language and need for interpreter reviewed:: Yes Do you feel safe going back to the place where you live?: Yes      Need for Family Participation in Patient Care: Yes (Comment) Care giver support system in place?: Yes (comment) Current home services: DME (rolling walker and wheelchair.) Criminal Activity/Legal Involvement Pertinent to Current Situation/Hospitalization: No - Comment as needed  Activities of Daily Living   ADL Screening (condition at time of admission) Independently  performs ADLs?: Yes (appropriate for developmental age) Is the patient deaf or have difficulty hearing?: No Does the patient have difficulty seeing, even when wearing glasses/contacts?: No Does the patient have difficulty concentrating, remembering, or making decisions?: No  Permission Sought/Granted Permission sought to share information with : Family Supports, Case Manager                Emotional Assessment Appearance:: Appears stated age Attitude/Demeanor/Rapport: Engaged Affect (typically observed): Appropriate Orientation: : Oriented to Self, Oriented to Place, Oriented to  Time, Oriented to Situation Alcohol / Substance Use: Not Applicable Psych Involvement: No (comment)  Admission diagnosis:  Epistaxis [R04.0] Recurrent epistaxis [R04.0] Anemia, unspecified type [D64.9] Patient Active Problem List   Diagnosis Date Noted   Generalized anxiety disorder 08/13/2023   Coping style affecting medical condition 08/13/2023   Closed trimalleolar fracture of right ankle 08/13/2023   Open wound of right ankle; discharged with wound vac 07/29/2023   Heart murmur, systolic 07/27/2023   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) 07/13/2023   Thrombocytopenia (HCC) 04/15/2023   Cholelithiasis 11/18/2022   Aortic atherosclerosis (HCC) 11/18/2022   Tobacco use disorder 06/22/2022   Depression with anxiety 04/12/2022   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/2021   Syncope 11/01/2020   Trapezius muscle spasm 07/22/2020   Leg pain, bilateral 09/28/2019   Urge incontinence 09/28/2019   Pain due to onychomycosis of toenails of both feet 02/26/2019   Diabetes mellitus (HCC) 02/26/2019   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  Anemia due to GI blood loss 12/07/2018   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   Hypokalemia    Port-A-Cath in place 09/18/2018   GAD (generalized anxiety disorder) 01/20/2018   Pulmonary artery hypertension (HCC)  10/19/2017   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   Venous stasis dermatitis of both lower extremities 05/07/2017   Anemia due to acute blood loss secondary to Epistaxis 11/29/2016   Iron  deficiency anemia 10/29/2016   Cough 05/13/2016   Osteoarthritis, multiple sites 06/05/2015   Insomnia 06/05/2015   Major depressive disorder, recurrent episode (HCC) 06/05/2015   Obesity, Class III, BMI 40-49.9 (morbid obesity) 04/26/2014   HLD (hyperlipidemia) 04/25/2014   Essential hypertension    PCP:  Nooruddin, Saad, MD Pharmacy:   [facility] [phone] - [place], [place] - [address] AT [facility] OF [address] [facility] [address]  [place] [address] Phone: [phone] Fax: [phone]  [place] [facility] [address] [place] [address] Phone: [phone] Fax: [phone]  [name] [address] [place] [address] Phone: [phone] Fax: [phone]     Social Drivers of Health (SDOH) Social History: SDOH Screenings   Food Insecurity: No Food Insecurity (12/25/2023)  Housing: Low Risk  (12/25/2023)  Transportation Needs: No Transportation Needs (12/25/2023)  Utilities: Not At Risk (12/25/2023)  Depression (PHQ2-9): High Risk (08/13/2023)  Tobacco Use: High Risk (12/24/2023)   SDOH Interventions:     Readmission Risk Interventions    04/18/2023    3:07 PM 04/15/2023    2:22 PM  Readmission Risk Prevention Plan  Transportation Screening Complete Complete  PCP or Specialist Appt within 5-7 Days  Complete  PCP or Specialist Appt within 3-5 Days Complete   Home Care Screening  Complete  Medication Review (RN CM)  Complete  HRI or Home Care Consult Complete   Social Work Consult for Recovery Care Planning/Counseling Complete   Palliative Care Screening Complete   Medication Review Oceanographer) Complete

## 2023-12-25 NOTE — Consult Note (Signed)
 WOC Nurse Consult Note:  WOC consult performed remotely utilizing imaging and chart review. Reason for Consult: RLE wounds Wound type: surgical wound to medial and lateral R lower leg - post debridement, post open fracture with removal of hardware Pressure Injury POA: No Measurement: see nursing flow sheets Wound bed:  Lateral RLE: scattered red tissue with yellow slough Medial RLE: 100% yellow slough Drainage (amount, consistency, odor) see nursing flow sheets Periwound: intact Dressing procedure/placement/frequency:   Reviewed previous office visit notes, noted use of collagen with adaptic, this is not on hospital formulary, last note dated 7/1, appears to have been discharge from plastic surgery service with need to follow up with wound care center.  Does not appear to have any wound care center follow up at this time.  ICM notes reflect an appointment for 9/18.  Will provide orders for wound care with products available on hospital formulary as follows:  Cleanse with NS, pat dry.  Apply 1/4 thick layer of leptospermum honey to wound bed,  top with silicone foam, change daily. Ok to lift silicone foam to reapply Medihoney daily.     WOC team will not follow patient at this time.  Please re-consult if new needs arise.   Thank you,  Doyal Polite, RN, MSN, [facility] WOC Team [phone] (Available Mon-Fri 0700-1500)

## 2023-12-26 LAB — PREPARE RBC (CROSSMATCH)

## 2023-12-26 LAB — BASIC METABOLIC PANEL WITH GFR
Anion gap: 11 (ref 5–15)
BUN: 8 mg/dL (ref 8–23)
CO2: 23 mmol/L (ref 22–32)
Calcium: 8.3 mg/dL — ABNORMAL LOW (ref 8.9–10.3)
Chloride: 107 mmol/L (ref 98–111)
Creatinine, Ser: 0.92 mg/dL (ref 0.44–1.00)
GFR, Estimated: >60 mL/min (ref >60)
Glucose, Bld: 126 mg/dL — ABNORMAL HIGH (ref 70–99)
Potassium: 4 mmol/L (ref 3.5–5.1)
Sodium: 141 mmol/L (ref 135–145)

## 2023-12-26 LAB — CBC
HCT: 22.1 % — ABNORMAL LOW (ref 36.0–46.0)
Hemoglobin: 7 g/dL — ABNORMAL LOW (ref 12.0–15.0)
MCH: 24.2 pg — ABNORMAL LOW (ref 26.0–34.0)
MCHC: 31.7 g/dL (ref 30.0–36.0)
MCV: 76.5 fL — ABNORMAL LOW (ref 80.0–100.0)
Platelets: 224 K/uL (ref 150–400)
RBC: 2.89 MIL/uL — ABNORMAL LOW (ref 3.87–5.11)
RDW: 21 % — ABNORMAL HIGH (ref 11.5–15.5)
WBC: 8.9 K/uL (ref 4.0–10.5)
nRBC: 0.3 % — ABNORMAL HIGH (ref 0.0–0.2)

## 2023-12-26 LAB — PREPARE FRESH FROZEN PLASMA

## 2023-12-26 LAB — BPAM FFP
Blood Product Expiration Date: 202509152359
ISSUE DATE / TIME: 202509111127
Unit Type and Rh: 6200

## 2023-12-26 LAB — HEMOGLOBIN AND HEMATOCRIT, BLOOD
HCT: 25.9 % — ABNORMAL LOW (ref 36.0–46.0)
Hemoglobin: 8.2 g/dL — ABNORMAL LOW (ref 12.0–15.0)

## 2023-12-26 MED ORDER — LIDOCAINE 5 % EX PTCH
1.0000 | MEDICATED_PATCH | CUTANEOUS | Status: DC
  Administered 2023-12-26: 1 via TRANSDERMAL
  Filled 2023-12-26: qty 1

## 2023-12-26 MED ORDER — SODIUM CHLORIDE 0.9% IV SOLUTION: Freq: Once | INTRAVENOUS | Status: AC

## 2023-12-26 MED ORDER — DICLOFENAC SODIUM 1 % EX GEL
2.0000 g | Freq: Two times a day (BID) | CUTANEOUS | Status: DC | PRN
  Administered 2023-12-26: 2 g via TOPICAL
  Filled 2023-12-26: qty 100

## 2023-12-26 MED ORDER — OXYCODONE HCL 5 MG PO TABS
5.0000 mg | ORAL_TABLET | ORAL | Status: DC | PRN
  Administered 2023-12-26 – 2023-12-27 (×5): 5 mg via ORAL
  Filled 2023-12-26 (×5): qty 1

## 2023-12-26 NOTE — Progress Notes (Addendum)
 HD#2 SUBJECTIVE:  Patient Summary:  Peggy House is a 63 y.o. female with hereditary hemorrhagic telangiectasia, recurrent epistaxis, and essential hypertension presented with active nosebleed and generalized weakness, found to have severe anemia (Hgb 3), and admitted for acute blood loss anemia due to epistaxis .   Overnight Events: none  Interim History:  Saw pt at bedside this AM. Denied any nosebleeds last night and said nosebleeds have slowed down a little. She is okay with plans to call ENT, if she has another severe nose bleed later today. Had pain on the right side of the back.   OBJECTIVE:  Vital Signs: Vitals:   12/25/23 1730 12/25/23 1939 12/26/23 0006 12/26/23 0555  BP: (!) 153/71 (!) 165/70 (!) 142/68 (!) 149/63  Pulse: 65 64 61 (!) 56  Resp: (!) 22 20 17 15   Temp: 98.4 F (36.9 C) 98.7 F (37.1 C) 98.9 F (37.2 C) 98.5 F (36.9 C)  TempSrc: Oral Oral Oral Oral  SpO2: 98% 98% 99% 98%  Weight:      Height:       Supplemental O2: Room Air SpO2: 98 %  Filed Weights   12/24/23 0753 12/24/23 2243  Weight: 102.1 kg 103.1 kg     Intake/Output Summary (Last 24 hours) at 12/26/2023 0806 Last data filed at 12/26/2023 0610 Gross per 24 hour  Intake 4718.33 ml  Output --  Net 4718.33 ml   Net IO Since Admission: 7,553.33 mL [12/26/23 0806]  Physical Exam: Physical Exam HENT:     Head: Normocephalic.  Cardiovascular:     Rate and Rhythm: Normal rate and regular rhythm.  Pulmonary:     Effort: Pulmonary effort is normal.     Breath sounds: Normal breath sounds.  Musculoskeletal:     Comments: Dressing over Right ankle  Neurological:     Mental Status: She is alert.  Psychiatric:        Mood and Affect: Mood normal.     Patient Lines/Drains/Airways Status     Active Line/Drains/Airways     Name Placement date Placement time Site Days   Peripheral IV 12/24/23 20 G Anterior;Proximal;Right Forearm 12/24/23  0814  Forearm  2   Peripheral IV  12/24/23 20 G Anterior;Left;Proximal Forearm 12/24/23  1005  Forearm  2   Wound 12/24/23 2300 Other (Comment) Pretibial Right;Lateral 12/24/23  2300  Pretibial  2   Wound 12/24/23 2300 Surgical Ankle Anterior;Right 12/24/23  2300  Ankle  2            Pertinent labs and imaging:      Latest Ref Rng & Units 12/26/2023    4:12 AM 12/25/2023    1:50 PM 12/25/2023    4:26 AM  CBC  WBC 4.0 - 10.5 K/uL 8.9   11.2   Hemoglobin 12.0 - 15.0 g/dL 7.0  8.1  6.6   Hematocrit 36.0 - 46.0 % 22.1  25.3  20.6   Platelets 150 - 400 K/uL 224   257        Latest Ref Rng & Units 12/26/2023    4:12 AM 12/25/2023    4:26 AM 12/24/2023    8:00 AM  CMP  Glucose 70 - 99 mg/dL 873  880  865   BUN 8 - 23 mg/dL 8  8  9    Creatinine 0.44 - 1.00 mg/dL 9.07  9.15  9.00   Sodium 135 - 145 mmol/L 141  141  141   Potassium 3.5 - 5.1 mmol/L 4.0  3.9  4.0   Chloride 98 - 111 mmol/L 107  110  108   CO2 22 - 32 mmol/L 23  20  20    Calcium  8.9 - 10.3 mg/dL 8.3  8.3  8.6   Total Protein 6.5 - 8.1 g/dL   6.2   Total Bilirubin 0.0 - 1.2 mg/dL   0.2   Alkaline Phos 38 - 126 U/L   72   AST 15 - 41 U/L   12   ALT 0 - 44 U/L   7     No results found.  ASSESSMENT/PLAN:  Assessment: Principal Problem:   Anemia due to acute blood loss secondary to Epistaxis Active Problems:   HLD (hyperlipidemia)   Essential hypertension   Obesity, Class III, BMI 40-49.9 (morbid obesity)   Insomnia   Major depressive disorder, recurrent episode (HCC)   GAD (generalized anxiety disorder)   Hereditary hemorrhagic telangiectasia (HCC)   Closed trimalleolar fracture of right ankle   Plan: #Anemia due to acute blood loss secondary to epistaxis #Hereditary hemorrhagic telangiectasis   Pt denied any nosebleeds last night and mentioned her bleeds have slowed down. Will give her a unit if blood as her Hgb dropped today, likely due to profuse active bleeding.   -Bleeding controlled with pressure, Afrin and saline use -Will consult  ENT if she has another bleed today -Consider TXA if bleeding persists -H&H post transfusion   #Closed trimalleolar fracture of the right ankle Right sided back pain Right ankle was covered in dressing. Pt complained of right sided back pain. Changed her oxycodone  to PRN 4 hours.  - Oxycodone  5 mg oral every 4 hours PRN for severe pain    #T2DM  #Class III obesity  Last A1c at 5.0.  Will continue to encourage lifestyle modification    #Essential hypertension BP today at 149/63. Continue home Lisinopril  20 mg daily and amlodipine  5 mg daily    #General Anxiety disorder #Major depressive disorder - Continue home Celexa  20 mg daily  - Continue home Trazodone  50 mg at bedtime   Best Practice: Diet: Regular  VTE: SCDs IVF: None,None Code: Full   Disposition planning: Prior to Admission Living Arrangement: Home, living with son  Anticipated Discharge Location: pending further evaluation    Dispo: Admit patient to Inpatient with expected length of stay greater than 2 midnights.  Signature:  [name] [name]  8:06 AM, 12/26/2023  On Call pager [phone]

## 2023-12-27 ENCOUNTER — Encounter: Payer: Self-pay | Admitting: Hematology

## 2023-12-27 ENCOUNTER — Other Ambulatory Visit (HOSPITAL_COMMUNITY): Payer: Self-pay

## 2023-12-27 DIAGNOSIS — I780 Hereditary hemorrhagic telangiectasia: Secondary | ICD-10-CM | POA: Diagnosis not present

## 2023-12-27 DIAGNOSIS — E119 Type 2 diabetes mellitus without complications: Secondary | ICD-10-CM | POA: Diagnosis not present

## 2023-12-27 DIAGNOSIS — F413 Other mixed anxiety disorders: Secondary | ICD-10-CM | POA: Diagnosis not present

## 2023-12-27 DIAGNOSIS — E669 Obesity, unspecified: Secondary | ICD-10-CM

## 2023-12-27 DIAGNOSIS — D62 Acute posthemorrhagic anemia: Secondary | ICD-10-CM | POA: Diagnosis not present

## 2023-12-27 DIAGNOSIS — R040 Epistaxis: Principal | ICD-10-CM

## 2023-12-27 LAB — CBC WITH DIFFERENTIAL/PLATELET
Abs Immature Granulocytes: 0.05 K/uL (ref 0.00–0.07)
Basophils Absolute: 0.1 K/uL (ref 0.0–0.1)
Basophils Relative: 1 %
Eosinophils Absolute: 0.2 K/uL (ref 0.0–0.5)
Eosinophils Relative: 2 %
HCT: 25.5 % — ABNORMAL LOW (ref 36.0–46.0)
Hemoglobin: 7.9 g/dL — ABNORMAL LOW (ref 12.0–15.0)
Immature Granulocytes: 1 %
Lymphocytes Relative: 18 %
Lymphs Abs: 1.7 K/uL (ref 0.7–4.0)
MCH: 24.7 pg — ABNORMAL LOW (ref 26.0–34.0)
MCHC: 31 g/dL (ref 30.0–36.0)
MCV: 79.7 fL — ABNORMAL LOW (ref 80.0–100.0)
Monocytes Absolute: 0.5 K/uL (ref 0.1–1.0)
Monocytes Relative: 6 %
Neutro Abs: 6.7 K/uL (ref 1.7–7.7)
Neutrophils Relative %: 72 %
Platelets: 243 K/uL (ref 150–400)
RBC: 3.2 MIL/uL — ABNORMAL LOW (ref 3.87–5.11)
RDW: 21.2 % — ABNORMAL HIGH (ref 11.5–15.5)
WBC: 9.3 K/uL (ref 4.0–10.5)
nRBC: 0.2 % (ref 0.0–0.2)

## 2023-12-27 LAB — BASIC METABOLIC PANEL WITH GFR
Anion gap: 9 (ref 5–15)
BUN: 7 mg/dL — ABNORMAL LOW (ref 8–23)
CO2: 23 mmol/L (ref 22–32)
Calcium: 8.6 mg/dL — ABNORMAL LOW (ref 8.9–10.3)
Chloride: 108 mmol/L (ref 98–111)
Creatinine, Ser: 0.76 mg/dL (ref 0.44–1.00)
GFR, Estimated: >60 mL/min (ref >60)
Glucose, Bld: 115 mg/dL — ABNORMAL HIGH (ref 70–99)
Potassium: 4 mmol/L (ref 3.5–5.1)
Sodium: 140 mmol/L (ref 135–145)

## 2023-12-27 LAB — BPAM RBC
Blood Product Expiration Date: 202509192359
Blood Product Expiration Date: 202509222359
Blood Product Expiration Date: 202509222359
Blood Product Expiration Date: 202509222359
Blood Product Expiration Date: 202509222359
Blood Product Expiration Date: 202509222359
ISSUE DATE / TIME: 202509101055
ISSUE DATE / TIME: 202509101306
ISSUE DATE / TIME: 202509101613
ISSUE DATE / TIME: 202509102341
ISSUE DATE / TIME: 202509110627
ISSUE DATE / TIME: 202509121121
Unit Type and Rh: 600
Unit Type and Rh: 600
Unit Type and Rh: 600
Unit Type and Rh: 600
Unit Type and Rh: 600
Unit Type and Rh: 600

## 2023-12-27 LAB — TYPE AND SCREEN
ABO/RH(D): A NEG
Antibody Screen: NEGATIVE
Unit division: 0
Unit division: 0
Unit division: 0
Unit division: 0
Unit division: 0
Unit division: 0

## 2023-12-27 MED ORDER — MEDIHONEY WOUND/BURN DRESSING EX PSTE
1.0000 | PASTE | Freq: Every day | CUTANEOUS | 0 refills | Status: AC
  Filled 2023-12-27: qty 44, 44d supply, fill #0

## 2023-12-27 MED ORDER — LIDOCAINE 4 % EX PTCH
1.0000 | MEDICATED_PATCH | CUTANEOUS | 0 refills | Status: AC
  Filled 2023-12-27: qty 6, 6d supply, fill #0

## 2023-12-27 NOTE — Discharge Summary (Signed)
 Name: Peggy House MRN: [id number] DOB: [birth year] 63 y.o. PCP: Nooruddin, Saad, MD  Date of Admission: 12/24/2023  7:44 AM Date of Discharge: 12/27/2023 Attending Physician: Dr. Eben   Discharge Diagnosis: Principal Problem:   Anemia due to acute blood loss secondary to Epistaxis Active Problems:   HLD (hyperlipidemia)   Essential hypertension   Obesity, Class III, BMI 40-49.9 (morbid obesity)   Insomnia   Major depressive disorder, recurrent episode (HCC)   GAD (generalized anxiety disorder)   Hereditary hemorrhagic telangiectasia (HCC)   Closed trimalleolar fracture of right ankle    Discharge Medications: Allergies as of 12/27/2023       Reactions   Feraheme  [ferumoxytol ] Other (See Comments)   Muscle tetany, encephalopathy, fatigue, nausea (reaction to injection)   Nsaids Other (See Comments)   Stomach bleeding episodes; Tylenol  is OK   Wasp Venom Anaphylaxis   Tomato Hives   Iron  (ferrous Sulfate ) [ferrous Sulfate  Er] Other (See Comments)   Shock        Medication List     TAKE these medications    Accu-Chek Guide test strip Generic drug: glucose blood Check blood sugar 2 times per day while on steroids before breakfast and dinner What changed:  how much to take how to take this when to take this   Accu-Chek Softclix Lancets lancets Use to check blood sugar before breakfast and before dinner while on steroids What changed:  how much to take how to take this when to take this   acetaminophen  325 MG tablet Commonly known as: Tylenol  Take 2 tablets (650 mg total) by mouth every 6 (six) hours as needed.   aminocaproic  acid 500 MG tablet Commonly known as: Amicar  Take 2 tablets (1,000 mg total) by mouth every 12 (twelve) hours.   amLODipine  10 MG tablet Commonly known as: NORVASC  Take 1 tablet (10 mg total) by mouth daily.   citalopram  20 MG tablet Commonly known as: CELEXA  TAKE 1 TABLET(20 MG) BY MOUTH DAILY What changed: See  the new instructions.   gabapentin  100 MG capsule Commonly known as: Neurontin  Take 1 capsule (100 mg total) by mouth at bedtime.   leptospermum manuka honey Pste paste Apply 1 Application topically daily. Start taking on: December 28, 2023   lidocaine  5 % Commonly known as: LIDODERM  Place 1 patch onto the skin daily. Remove & Discard patch within 12 hours or as directed by MD   lisinopril  20 MG tablet Commonly known as: ZESTRIL  Take 1 tablet (20 mg total) by mouth daily.   pantoprazole  40 MG tablet Commonly known as: PROTONIX  TAKE 1 TABLET(40 MG) BY MOUTH TWICE DAILY What changed: See the new instructions.   potassium chloride  SA 20 MEQ tablet Commonly known as: KLOR-CON  M Take 1 tablet (20 mEq total) by mouth daily.   ProAir  HFA 108 (90 Base) MCG/ACT inhaler Generic drug: albuterol  INHALE 1 TO 2 PUFFS INTO THE LUNGS EVERY 6 HOURS AS NEEDED FOR WHEEZING OR SHORTNESS OF BREATH   Senna-S 8.6-50 MG tablet Generic drug: senna-docusate Take 1 tablet by mouth at bedtime.   traZODone  50 MG tablet Commonly known as: DESYREL  TAKE 1 TABLET(50 MG) BY MOUTH AT BEDTIME AS NEEDED FOR SLEEP        Disposition and follow-up:   Peggy House was discharged from [facility] in Stable condition.  At the hospital follow up visit please address:  1.  Follow-up:  a.  Acute blood loss anemia: Patient has multiple nosebleeds in the setting  of HHT.  Hemoglobin at discharge was 7.9.  Follow-up CBC outpatient.  Refer to ENT if required.  Patient had 6 units of blood during hospitalization, 1 FFP, and 1 unit of platelets  2.  Labs / imaging needed at time of follow-up: CBC  3.  Pending labs/ test needing follow-up: N/A  4.  Medication Changes  No medication changes  Follow-up Appointments:  Follow-up Information     Nooruddin, Saad, MD. Call in 2 day(s).   Specialty: Internal Medicine Why: Please call clinic to schedule hospital follow-up within the  next week. Contact information: [address] [place] [address] [phone]                 Hospital Course by problem list: Peggy House is a 63 y.o. female with hereditary hemorrhagic telangiectasia, recurrent epistaxis, and essential hypertension presented with active nosebleed and generalized weakness, found to have severe anemia (Hgb 3), and  admitted for acute blood loss anemia due to epistaxis.   #Anemia due to acute blood loss secondary to epistaxis #Hereditary hemorrhagic telangiectasis Pt came in with epistaxis that began the morning of her admission. She has a hx of HHT and similar nosebleeds in the past. Pt denied any recent nasal surgeries or septal perfusion. Her Hgb levels were 2.7 with a repeat of 3.2, on admission. Her anemia was likely due to acute blood loss. She received 6 transfusions in total for low hgb levels. Current levels are 7.6. Pt also received FFP and platelet transfusion as she had over 4 pRBC transfusions. Her nose bleeds were controlled with Afrin and saline nasal spray along with applying pressure in the area.  Did not require ENT consultation during hospitalization.   #Closed trimalleolar fracture of the right ankle #Right sided back pain Examined the injury site, which is covered with a dry, clean, and intact dressing. The patient reports mild pain at the site currently. Will continue to monitor  and manage her pain as needed. Pt also endorsed right sided back pain which was likely musculoskeletal in nature. Tylenol  and oxycodone  were used for pain management.  Patient was discharged with lidocaine  patch.   #T2DM  #Class III obesity  BMI 38.62. Last Hgb A1c ten months ago was well controlled at 5. No active concerns at this time. Will continue to encourage lifestyle modification    #Essential hypertension Blood pressure is well-controlled during hospitalization.  Patient was continued on home amlodipine  and lisinopril   during hospitalization.  Discharged with same   #General Anxiety disorder #Major depressive disorder Continued home Celexa  20 mg daily and Trazodone  50 mg at bedtime during hospitalization  Discharge Subjective: Patient evaluated bedside this morning.  She reports that she slept well.  Her appetite is well.  She denies any bleeding overnight.  She states she is ready go home and excited to go home.  Discharge Exam:   BP (!) 164/75 (BP Location: Right Arm)   Pulse (!) 59   Temp 97.7 F (36.5 C) (Axillary)   Resp 13   Ht 5' 2 (1.575 m)   Wt 103.1 kg   SpO2 99%   BMI 41.57 kg/m  Constitutional: well-appearing resting in bed, no acute distress HENT: normocephalic atraumatic, tongue mucosa with some telangiectasias Eyes: conjunctiva non-erythematous Cardiovascular: regular rate and rhythm, no m/r/g Pulmonary/Chest: normal work of breathing on room air, lungs clear to auscultation bilaterally Extremities: Right lower extremity with no evidence of swelling or bleeding.  Bandage in place.  Pertinent Labs,  Studies, and Procedures:     Latest Ref Rng & Units 12/27/2023    5:31 AM 12/26/2023    5:52 PM 12/26/2023    4:12 AM  CBC  WBC 4.0 - 10.5 K/uL 9.3   8.9   Hemoglobin 12.0 - 15.0 g/dL 7.9  8.2  7.0   Hematocrit 36.0 - 46.0 % 25.5  25.9  22.1   Platelets 150 - 400 K/uL 243   224        Latest Ref Rng & Units 12/27/2023    5:31 AM 12/26/2023    4:12 AM 12/25/2023    4:26 AM  CMP  Glucose 70 - 99 mg/dL 884  873  880   BUN 8 - 23 mg/dL 7  8  8    Creatinine 0.44 - 1.00 mg/dL 9.23  9.07  9.15   Sodium 135 - 145 mmol/L 140  141  141   Potassium 3.5 - 5.1 mmol/L 4.0  4.0  3.9   Chloride 98 - 111 mmol/L 108  107  110   CO2 22 - 32 mmol/L 23  23  20    Calcium  8.9 - 10.3 mg/dL 8.6  8.3  8.3     No results found.   Discharge Instructions: Discharge Instructions     Call MD for:  persistant dizziness or light-headedness   Complete by: As directed    Call MD for:  redness,  tenderness, or signs of infection (pain, swelling, redness, odor or green/yellow discharge around incision site)   Complete by: As directed    Call MD for:  severe uncontrolled pain   Complete by: As directed    Call MD for:  temperature >100.4   Complete by: As directed    Diet - low sodium heart healthy   Complete by: As directed    Discharge instructions   Complete by: As directed    Peggy House, It was a pleasure taking care of you at [facility]. You were admitted for a nosebleed and you are required a lot of blood. We are discharging you home now that you are doing better. Please follow the following instructions.   1) We are not making any medication changes today.  Please continue taking all of your medications as prescribed.  2) If you have another nosebleed, make sure to apply pressure and try to stop it on your own.  Your hemoglobin at discharge was stable.  We are not concern for any further bleeding.  3) Within the next week, I want you to call your primary care physician, please ask them to schedule you an appointment for hospital follow-up, and you would like to be seen within 1 week. I have messaged our office to give you  a phone call to schedule an appointment.   4) If you do have recurrent nosebleeds, and become lightheaded, dizzy, or feel like you are about to pass out, please sit down slowly, to avoid falling.  If nosebleeds do not stop, please return back to the emergency department.  Take care,  Dr. Libby Blanch, DO   Increase activity slowly   Complete by: As directed    No wound care   Complete by: As directed       Signed: Blanch Libby, DO 12/27/2023, 10:02 AM   Internal Medicine Resident PGY-3

## 2023-12-27 NOTE — Progress Notes (Signed)
 CCMD informed me patient had a 17 beat run of V tach at 0012. Checked on Pt who was asymptomatic with no chest pain. Let Dr. Benuel know through secure chat. Dr. Benuel said electrolytes were good and we should continue to monitor.

## 2023-12-27 NOTE — Discharge Instructions (Addendum)
 Peggy House, It was a pleasure taking care of you at [facility]. You were admitted for a nosebleed and you are required a lot of blood. We are discharging you home now that you are doing better. Please follow the following instructions.   1) We are not making any medication changes today.  Please continue taking all of your medications as prescribed.  2) If you have another nosebleed, make sure to apply pressure and try to stop it on your own.  Your hemoglobin at discharge was stable.  We are not concern for any further bleeding.  3) Within the next week, I want you to call your primary care physician, please ask them to schedule you an appointment for hospital follow-up, and you would like to be seen within 1 week. I have messaged our office to give you  a phone call to schedule an appointment.   4) If you do have recurrent nosebleeds, and become lightheaded, dizzy, or feel like you are about to pass out, please sit down slowly, to avoid falling.  If nosebleeds do not stop, please return back to the emergency department.  Take care,  Dr. Libby Blanch, DO

## 2023-12-29 ENCOUNTER — Telehealth: Payer: Self-pay | Admitting: *Deleted

## 2023-12-29 ENCOUNTER — Encounter: Payer: Self-pay | Admitting: *Deleted

## 2023-12-29 NOTE — Patient Instructions (Signed)
 [place] [employer] - I am sorry I was unable to reach you today for our scheduled appointment. I work with Nooruddin, Saad, MD and am calling to support your healthcare needs. Please contact me at [phone] at your earliest convenience. I look forward to speaking with you soon.   Thank you,   Olam Ku, RN, BSN [place]  [facility], [facility] RN Care Manager Direct Dial: [phone]  Fax: [phone]

## 2023-12-29 NOTE — Patient Outreach (Signed)
 Complex Care Management   Visit Note  12/29/2023  Name:  Peggy House MRN: [id number] DOB: May 29, 1960    Follow Up Plan:   We have been unable to make contact with the patient x 3 attempts. Closing from Complex Care Management  Olam Ku, RN, BSN [place]  [facility], [facility] RN Care Manager Direct Dial: [phone]  Fax: [phone]

## 2024-01-01 ENCOUNTER — Encounter (HOSPITAL_BASED_OUTPATIENT_CLINIC_OR_DEPARTMENT_OTHER): Attending: General Surgery | Admitting: General Surgery

## 2024-01-01 DIAGNOSIS — T8131XA Disruption of external operation (surgical) wound, not elsewhere classified, initial encounter: Secondary | ICD-10-CM | POA: Insufficient documentation

## 2024-01-01 DIAGNOSIS — I780 Hereditary hemorrhagic telangiectasia: Secondary | ICD-10-CM | POA: Insufficient documentation

## 2024-01-01 DIAGNOSIS — Y838 Other surgical procedures as the cause of abnormal reaction of the patient, or of later complication, without mention of misadventure at the time of the procedure: Secondary | ICD-10-CM | POA: Insufficient documentation

## 2024-01-01 DIAGNOSIS — E11622 Type 2 diabetes mellitus with other skin ulcer: Secondary | ICD-10-CM | POA: Insufficient documentation

## 2024-01-01 DIAGNOSIS — L97812 Non-pressure chronic ulcer of other part of right lower leg with fat layer exposed: Secondary | ICD-10-CM | POA: Diagnosis not present

## 2024-01-02 ENCOUNTER — Encounter: Attending: Physical Medicine and Rehabilitation | Admitting: Physical Medicine and Rehabilitation

## 2024-01-02 DIAGNOSIS — M25571 Pain in right ankle and joints of right foot: Secondary | ICD-10-CM | POA: Diagnosis not present

## 2024-01-06 ENCOUNTER — Other Ambulatory Visit: Payer: Self-pay

## 2024-01-06 DIAGNOSIS — I780 Hereditary hemorrhagic telangiectasia: Secondary | ICD-10-CM

## 2024-01-06 DIAGNOSIS — D500 Iron deficiency anemia secondary to blood loss (chronic): Secondary | ICD-10-CM

## 2024-01-06 DIAGNOSIS — D649 Anemia, unspecified: Secondary | ICD-10-CM

## 2024-01-06 NOTE — Assessment & Plan Note (Signed)
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 -she has required multiple blood transfusions and APCs since 2019. 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 -She had multiple prolonged hospital stay due to severe GI bleeding.   -s/p IR embolization of gastric artery branch vessels on 12/10/17, cauterization of stomach for severe GI bleed in 11/2018, and epinephrine injection for bleeding ulcer on 11/03/20. -She has required frequent blood transfusion, IV iron and bevacizumab -She does respond to treatment, anemia improved with IV iron and bevacizumab, but she is not compliant with her appointments.

## 2024-01-06 NOTE — Assessment & Plan Note (Addendum)
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

## 2024-01-07 ENCOUNTER — Inpatient Hospital Stay

## 2024-01-07 ENCOUNTER — Inpatient Hospital Stay: Attending: Hematology | Admitting: Hematology

## 2024-01-07 ENCOUNTER — Encounter: Payer: Self-pay | Admitting: *Deleted

## 2024-01-07 ENCOUNTER — Telehealth: Payer: Self-pay | Admitting: *Deleted

## 2024-01-07 ENCOUNTER — Other Ambulatory Visit: Payer: Self-pay | Admitting: *Deleted

## 2024-01-07 VITALS — BP 134/68 | HR 67 | Temp 97.4°F | Resp 17 | Ht 62.0 in | Wt 227.1 lb

## 2024-01-07 DIAGNOSIS — I780 Hereditary hemorrhagic telangiectasia: Secondary | ICD-10-CM

## 2024-01-07 DIAGNOSIS — Z8711 Personal history of peptic ulcer disease: Secondary | ICD-10-CM | POA: Insufficient documentation

## 2024-01-07 DIAGNOSIS — D500 Iron deficiency anemia secondary to blood loss (chronic): Secondary | ICD-10-CM

## 2024-01-07 DIAGNOSIS — Q2730 Arteriovenous malformation, site unspecified: Secondary | ICD-10-CM | POA: Insufficient documentation

## 2024-01-07 DIAGNOSIS — D649 Anemia, unspecified: Secondary | ICD-10-CM

## 2024-01-07 LAB — CBC WITH DIFFERENTIAL (CANCER CENTER ONLY)
Abs Immature Granulocytes: 0.05 K/uL (ref 0.00–0.07)
Basophils Absolute: 0.1 K/uL (ref 0.0–0.1)
Basophils Relative: 0 %
Eosinophils Absolute: 0.1 K/uL (ref 0.0–0.5)
Eosinophils Relative: 1 %
HCT: 24.4 % — ABNORMAL LOW (ref 36.0–46.0)
Hemoglobin: 7.3 g/dL — ABNORMAL LOW (ref 12.0–15.0)
Immature Granulocytes: 0 %
Lymphocytes Relative: 11 %
Lymphs Abs: 1.3 K/uL (ref 0.7–4.0)
MCH: 24.3 pg — ABNORMAL LOW (ref 26.0–34.0)
MCHC: 29.9 g/dL — ABNORMAL LOW (ref 30.0–36.0)
MCV: 81.1 fL (ref 80.0–100.0)
Monocytes Absolute: 0.6 K/uL (ref 0.1–1.0)
Monocytes Relative: 5 %
Neutro Abs: 10.4 K/uL — ABNORMAL HIGH (ref 1.7–7.7)
Neutrophils Relative %: 83 %
Platelet Count: 449 K/uL — ABNORMAL HIGH (ref 150–400)
RBC: 3.01 MIL/uL — ABNORMAL LOW (ref 3.87–5.11)
RDW: 22.5 % — ABNORMAL HIGH (ref 11.5–15.5)
WBC Count: 12.5 K/uL — ABNORMAL HIGH (ref 4.0–10.5)
nRBC: 0 % (ref 0.0–0.2)

## 2024-01-07 LAB — IRON AND IRON BINDING CAPACITY (CC-WL,HP ONLY)
Iron: 17 ug/dL — ABNORMAL LOW (ref 28–170)
Saturation Ratios: 5 % — ABNORMAL LOW (ref 10.4–31.8)
TIBC: 353 ug/dL (ref 250–450)
UIBC: 336 ug/dL (ref 148–442)

## 2024-01-07 LAB — CMP (CANCER CENTER ONLY)
ALT: <5 U/L (ref 0–44)
AST: 10 U/L — ABNORMAL LOW (ref 15–41)
Albumin: 3.9 g/dL (ref 3.5–5.0)
Alkaline Phosphatase: 77 U/L (ref 38–126)
Anion gap: 5 (ref 5–15)
BUN: 8 mg/dL (ref 8–23)
CO2: 25 mmol/L (ref 22–32)
Calcium: 8.7 mg/dL — ABNORMAL LOW (ref 8.9–10.3)
Chloride: 110 mmol/L (ref 98–111)
Creatinine: 0.75 mg/dL (ref 0.44–1.00)
GFR, Estimated: >60 mL/min (ref >60)
Glucose, Bld: 106 mg/dL — ABNORMAL HIGH (ref 70–99)
Potassium: 3.8 mmol/L (ref 3.5–5.1)
Sodium: 140 mmol/L (ref 135–145)
Total Bilirubin: 0.3 mg/dL (ref 0.0–1.2)
Total Protein: 7 g/dL (ref 6.5–8.1)

## 2024-01-07 LAB — SAMPLE TO BLOOD BANK

## 2024-01-07 LAB — FERRITIN: Ferritin: 19 ng/mL (ref 11–307)

## 2024-01-07 LAB — PREPARE RBC (CROSSMATCH)

## 2024-01-07 NOTE — Progress Notes (Signed)
 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 -she has required multiple blood transfusions and APCs since 2019. 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 -She had multiple prolonged hospital stay due to severe GI bleeding.   -s/p IR embolization of gastric artery branch vessels on 12/10/17, cauterization of stomach for severe GI bleed in 11/2018, and epinephrine  injection for bleeding ulcer on 11/03/20. -She has required frequent blood transfusion, IV iron  and bevacizumab  -She does respond to treatment, anemia improved with IV iron  and bevacizumab , but she is not compliant with her appointments.  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 Bevacizumab  is contraindicated due to a non-healing wound. - Administer IV iron  at [facility]. - Schedule blood transfusion for Saturday. - Monitor blood counts every two weeks. - Hold bevacizumab  until wound heals. - She had severe  allergic reaction to ferumoxytol  characterized by shaking and difficulty breathing.  She has been tolerating ferric gluconate very well, but is time-consuming and requires multiple appointments. -- Administer Monoferric (ferric derisomaltose) as an alternative IV iron  formulation, monitoring closely for adverse reactions.  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 The wound is currently healing but remains a concern for delayed healing. - Continue current wound care regimen with honey dressing. - Coordinate with orthopedic surgeon Dr. Sherida for ongoing wound management.  Plan - Lab reviewed, hemoglobin 7.3, will schedule 1 unit of blood transfusion this Saturday - I recommend IV iron  Monoferric 1 g in the next week, with premedications due to previous reaction to Feraheme  - Lab in 2 weeks, lab and follow-up in 4 weeks. - Will continue to hold bevacizumab  due to her open wound. - I messaged our social worker to call her, she is going to lose her housing in a few weeks.    Discussed the use of AI scribe software for clinical note transcription with the patient, who gave verbal consent to proceed.  History of Present Illness Peggy House is a 63 year old female with iron  deficiency anemia who presents for follow-up.  She was recently hospitalized for a critically low hemoglobin level below 4 g/dL, requiring multiple blood transfusions. Her current hemoglobin level is 7.3 g/dL. She experienced a severe reaction to a type of iron  administered in the hospital, including shaking and difficulty breathing, but she tolerates her current low dose of iron  well.  She is managing a non-healing wound on her leg from a fracture in January, which involved three places and required surgical intervention. The wound  is still healing with a special honey dressing under orthopedic care.  She is experiencing significant stress due to her living situation, needing to find a new place to live by October 6th. She attributes a recent episode of epistaxis to this stress. She does not drive and relies on friends for transportation, with Medicaid insurance coverage.     All other systems were reviewed with the patient and are negative.  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    SURGICAL HISTORY: Past Surgical History:  Procedure Laterality Date   ABDOMINAL HYSTERECTOMY     APPLICATION OF WOUND VAC Right 07/28/2023   Procedure: APPLICATION, WOUND VAC;  Surgeon: Lowery Estefana RAMAN, DO;  [facility];  Service: Plastics;  Laterality: Right;   APPLICATION, SKIN SUBSTITUTE Right 07/28/2023   Procedure: APPLICATION, SKIN SUBSTITUTE;  Surgeon: Lowery Estefana RAMAN, DO;  [facility]: [facility];  Service: Plastics;  Laterality: Right;   CARPAL TUNNEL RELEASE  05/13/2011   Procedure: CARPAL TUNNEL RELEASE;  Surgeon: Eva Elsie Herring, MD;  Location: [facility];  Service: Orthopedics;  Laterality: Left;   COLONOSCOPY N/A 03/02/2020   Procedure: COLONOSCOPY;  Surgeon: Donnald Charleston, MD;  Location: [facility];  Service: Endoscopy;  Laterality: N/A;   COLONOSCOPY WITH PROPOFOL  N/A 04/28/2014   Procedure: COLONOSCOPY WITH PROPOFOL ;  Surgeon: Charleston Donnald GAILS, MD;  Location: [facility] ENDOSCOPY;  Service: Endoscopy;  Laterality: N/A;   DG TOES*L*  2/10   rt   DILATION AND CURETTAGE OF UTERUS     ENTEROSCOPY N/A 10/17/2017    Procedure: ENTEROSCOPY;  Surgeon: Elicia Claw, MD;  [facility];  Service: Gastroenterology;  Laterality: N/A;   ESOPHAGOGASTRODUODENOSCOPY N/A 04/10/2014   Procedure: ESOPHAGOGASTRODUODENOSCOPY (EGD);  Surgeon: Jerrell KYM Sol, MD;  Location: [facility] ENDOSCOPY;  Service: Endoscopy;  Laterality: N/A;   ESOPHAGOGASTRODUODENOSCOPY N/A 05/10/2017   Procedure: ESOPHAGOGASTRODUODENOSCOPY (EGD);  Surgeon: Donnald Charleston, MD;  Location: [facility] ENDOSCOPY;  Service: Endoscopy;  Laterality: N/A;   ESOPHAGOGASTRODUODENOSCOPY N/A 09/22/2017   Procedure: ESOPHAGOGASTRODUODENOSCOPY (EGD);  Surgeon: Rosalie Kitchens, MD;  Location: [facility] ENDOSCOPY;  Service: Endoscopy;  Laterality: N/A;  bedside   ESOPHAGOGASTRODUODENOSCOPY N/A 03/02/2020   Procedure: ESOPHAGOGASTRODUODENOSCOPY (EGD);  Surgeon: Donnald Charleston, MD;  Location: [facility];  Service: Endoscopy;  Laterality: N/A;   ESOPHAGOGASTRODUODENOSCOPY N/A 11/03/2020   Procedure: ESOPHAGOGASTRODUODENOSCOPY (EGD);  Surgeon: Sol Jerrell, MD;  Location: [facility] ENDOSCOPY;  Service: Endoscopy;  Laterality: N/A;   ESOPHAGOGASTRODUODENOSCOPY N/A 04/12/2022   Procedure: ESOPHAGOGASTRODUODENOSCOPY (EGD);  Surgeon: Rosalie Kitchens, MD;  Location: [facility];  Service: Gastroenterology;  Laterality: N/A;   ESOPHAGOGASTRODUODENOSCOPY N/A 05/27/2022   Procedure: ESOPHAGOGASTRODUODENOSCOPY (EGD);  Surgeon: Burnette Elsie, MD;  Location: [facility];  Service: Gastroenterology;  Laterality: N/A;   ESOPHAGOGASTRODUODENOSCOPY N/A 09/27/2022   Procedure: ESOPHAGOGASTRODUODENOSCOPY (EGD);  Surgeon: Kriss Estefana DEL, DO;  Location: [facility] ENDOSCOPY;  Service: Gastroenterology;  Laterality: N/A;   ESOPHAGOGASTRODUODENOSCOPY (EGD) WITH PROPOFOL  N/A 04/27/2014   Procedure: ESOPHAGOGASTRODUODENOSCOPY (  EGD) WITH PROPOFOL ;  Surgeon: Lamar Donnald GAILS, MD;  Location: [facility] ENDOSCOPY;  Service: Endoscopy;  Laterality: N/A;  possible apc   ESOPHAGOGASTRODUODENOSCOPY (EGD) WITH PROPOFOL  N/A  09/30/2017   Procedure: ESOPHAGOGASTRODUODENOSCOPY (EGD) WITH PROPOFOL ;  Surgeon: Saintclair Jasper, MD;  Location: [facility] ENDOSCOPY;  Service: Gastroenterology;  Laterality: N/A;   ESOPHAGOGASTRODUODENOSCOPY (EGD) WITH PROPOFOL  N/A 10/01/2017   Procedure: ESOPHAGOGASTRODUODENOSCOPY (EGD) WITH PROPOFOL ;  Surgeon: Saintclair Jasper, MD;  Location: [facility] ENDOSCOPY;  Service: Gastroenterology;  Laterality: N/A;   ESOPHAGOGASTRODUODENOSCOPY (EGD) WITH PROPOFOL  N/A 10/08/2017   Procedure: ESOPHAGOGASTRODUODENOSCOPY (EGD) WITH PROPOFOL ;  Surgeon: Elicia Claw, MD;  [facility];  Service: Gastroenterology;  Laterality: N/A;   ESOPHAGOGASTRODUODENOSCOPY (EGD) WITH PROPOFOL  N/A 10/17/2017   Procedure: ESOPHAGOGASTRODUODENOSCOPY (EGD) WITH PROPOFOL ;  Surgeon: Elicia Claw, MD;  [facility];  Service: Gastroenterology;  Laterality: N/A;   ESOPHAGOGASTRODUODENOSCOPY (EGD) WITH PROPOFOL  N/A 10/19/2017   Procedure: ESOPHAGOGASTRODUODENOSCOPY (EGD) WITH PROPOFOL ;  Surgeon: Elicia Claw, MD;  [facility];  Service: Gastroenterology;  Laterality: N/A;   ESOPHAGOGASTRODUODENOSCOPY (EGD) WITH PROPOFOL  N/A 12/04/2018   Procedure: ESOPHAGOGASTRODUODENOSCOPY (EGD) WITH PROPOFOL ;  Surgeon: Dianna Specking, MD;  Location: [facility];  Service: Endoscopy;  Laterality: N/A;   GIVENS CAPSULE STUDY N/A 10/02/2017   Procedure: GIVENS CAPSULE STUDY;  Surgeon: Saintclair Jasper, MD;  Location: [facility] ENDOSCOPY;  Service: Gastroenterology;  Laterality: N/A;   GIVENS CAPSULE STUDY N/A 10/08/2017   Procedure: GIVENS CAPSULE STUDY;  Surgeon: Elicia Claw, MD;  [facility];  Service: Gastroenterology;  Laterality: N/A;  endoscopic placement of capsule   GIVENS CAPSULE STUDY N/A 03/02/2020   Procedure: GIVENS CAPSULE STUDY;  Surgeon: Donnald Lamar, MD;  Location: [facility];  Service: Endoscopy;  Laterality: N/A;   HARDWARE REMOVAL Right 07/15/2023   Procedure: REMOVAL, HARDWARE;  Surgeon: Sherida Adine BROCKS, MD;  [facility];  Service: Orthopedics;  Laterality: Right;   HEMOSTASIS CLIP PLACEMENT  12/04/2018   Procedure: HEMOSTASIS CLIP PLACEMENT;  Surgeon: Dianna Specking, MD;  Location[facility];  Service: Endoscopy;;   HEMOSTASIS CLIP PLACEMENT  05/27/2022   Procedure: HEMOSTASIS CLIP PLACEMENT;  Surgeon: Burnette Fallow, MD;  Location: [facility];  Service: Gastroenterology;;   HEMOSTASIS CLIP PLACEMENT  09/27/2022   Procedure: HEMOSTASIS CLIP PLACEMENT;  Surgeon: Kriss Estefana DEL, DO;  Location: [facility] ENDOSCOPY;  Service: Gastroenterology;;   HEMOSTASIS CONTROL  05/27/2022   Procedure: HEMOSTASIS CONTROL;  Surgeon: Burnette Fallow, MD;  [facility];  Service: Gastroenterology;;   HOT HEMOSTASIS N/A 04/27/2014   Procedure: HOT HEMOSTASIS (ARGON PLASMA COAGULATION/BICAP);  Surgeon: Lamar Donnald GAILS, MD;  Location: [facility] ENDOSCOPY;  Service: Endoscopy;  Laterality: N/A;   HOT HEMOSTASIS N/A 09/30/2017   Procedure: HOT HEMOSTASIS (ARGON PLASMA COAGULATION/BICAP);  Surgeon: Saintclair Jasper, MD;  Location: [facility] ENDOSCOPY;  Service: Gastroenterology;  Laterality: N/A;   HOT HEMOSTASIS N/A 10/01/2017   Procedure: HOT HEMOSTASIS (ARGON PLASMA COAGULATION/BICAP);  Surgeon: Saintclair Jasper, MD;  Location: [facility] ENDOSCOPY;  Service: Gastroenterology;  Laterality: N/A;   HOT HEMOSTASIS N/A 10/17/2017   Procedure: HOT HEMOSTASIS (ARGON PLASMA COAGULATION/BICAP);  Surgeon: Elicia Claw, MD;  Location: [facility] ENDOSCOPY;  Service: Gastroenterology;  Laterality: N/A;   HOT HEMOSTASIS N/A 10/19/2017   Procedure: HOT HEMOSTASIS (ARGON PLASMA COAGULATION/BICAP);  Surgeon: Elicia Claw, MD;  Location: [facility] ENDOSCOPY;  Service: Gastroenterology;  Laterality: N/A;   HOT HEMOSTASIS N/A 03/02/2020   Procedure: HOT HEMOSTASIS (ARGON PLASMA COAGULATION/BICAP);  Surgeon: Donnald Lamar, MD;  Location: [facility];  Service: Endoscopy;  Laterality: N/A;   HOT HEMOSTASIS N/A 04/12/2022  Procedure: HOT HEMOSTASIS (ARGON  PLASMA COAGULATION/BICAP);  Surgeon: Rosalie Kitchens, MD;  Location: [facility];  Service: Gastroenterology;  Laterality: N/A;   INCISION AND DRAINAGE OF WOUND Right 07/21/2023   Procedure: IRRIGATION AND DEBRIDEMENT WOUND;  Surgeon: Sherida Adine BROCKS, MD;  [facility];  Service: Orthopedics;  Laterality: Right;  RIGHT ANKLE WOUND I&D   INCISION AND DRAINAGE OF WOUND Right 07/28/2023   Procedure: IRRIGATION AND DEBRIDEMENT WOUND;  Surgeon: Lowery Estefana RAMAN, DO;  Location: [facility];  Service: Plastics;  Laterality: Right;  rrigation and debridement of right ankle wound with placement of myriad and wound vac   IR IMAGING GUIDED PORT INSERTION  07/08/2018   IRRIGATION AND DEBRIDEMENT POSTERIOR HIP Right 07/15/2023   Procedure: IRRIGATION AND DEBRIDEMENT, OPEN FRACTURE;  Surgeon: Sherida Adine BROCKS, MD;  Location: [facility];  Service: Orthopedics;  Laterality: Right;   L shoulder Surgery  2011   ORIF ANKLE FRACTURE Right 04/24/2023   Procedure: OPEN REDUCTION INTERNAL FIXATION (ORIF) ANKLE FRACTURE;  Surgeon: Sherida Adine BROCKS, MD;  Location: [facility];  Service: Orthopedics;  Laterality: Right;   POLYPECTOMY  03/02/2020   Procedure: POLYPECTOMY;  Surgeon: Donnald Charleston, MD;  Location: [facility];  Service: Endoscopy;;   SCLEROTHERAPY  11/03/2020   Procedure: MATIAS;  Surgeon: Dianna Specking, MD;  Location: [facility] ENDOSCOPY;  Service: Endoscopy;;   SUBMUCOSAL INJECTION  09/22/2017   Procedure: SUBMUCOSAL INJECTION;  Surgeon: Rosalie Kitchens, MD;  Location: [facility] ENDOSCOPY;  Service: Endoscopy;;   SUBMUCOSAL INJECTION  12/04/2018   Procedure: SUBMUCOSAL INJECTION;  Surgeon: Dianna Specking, MD;  Location: [facility];  Service: Endoscopy;;    I have reviewed the social history and family history with the patient and they are unchanged from previous note.  ALLERGIES:  is allergic to feraheme  [ferumoxytol ], nsaids, wasp venom, tomato, and iron  (ferrous sulfate ) [ferrous sulfate  er].  MEDICATIONS:  Current  Outpatient Medications  Medication Sig Dispense Refill   Accu-Chek Softclix Lancets lancets Use to check blood sugar before breakfast and before dinner while on steroids (Patient taking differently: 1 each by Other route See admin instructions. Use to check blood sugar before breakfast and before dinner while on steroids) 100 each 1   acetaminophen  (TYLENOL ) 325 MG tablet Take 2 tablets (650 mg total) by mouth every 6 (six) hours as needed. 100 tablet 0   aminocaproic  acid (AMICAR ) 500 MG tablet Take 2 tablets (1,000 mg total) by mouth every 12 (twelve) hours. (Patient not taking: Reported on 12/24/2023) 120 tablet 3   amLODipine  (NORVASC ) 10 MG tablet Take 1 tablet (10 mg total) by mouth daily. 30 tablet 11   citalopram  (CELEXA ) 20 MG tablet TAKE 1 TABLET(20 MG) BY MOUTH DAILY (Patient taking differently: Take 20 mg by mouth daily as needed (for anxiety).) 90 tablet 3   gabapentin  (NEURONTIN ) 100 MG capsule Take 1 capsule (100 mg total) by mouth at bedtime. 90 capsule 3   glucose blood (ACCU-CHEK GUIDE) test strip Check blood sugar 2 times per day while on steroids before breakfast and dinner (Patient taking differently: 1 each by Other route See admin instructions. Check blood sugar 2 times per day while on steroids before breakfast and dinner) 50 each 3   leptospermum manuka honey (MEDIHONEY) PSTE paste Apply 1 Application topically daily. 15 mL 0   lidocaine  4 % Place 1 patch onto the skin daily. Remove & Discard patch within 12 hours or as directed by MD 30 patch 0   lisinopril  (ZESTRIL ) 20 MG tablet Take 1 tablet (  20 mg total) by mouth daily. 30 tablet 3   pantoprazole  (PROTONIX ) 40 MG tablet TAKE 1 TABLET(40 MG) BY MOUTH TWICE DAILY (Patient taking differently: Take 40 mg by mouth 2 (two) times daily as needed (for acid reflux).) 180 tablet 1   potassium chloride  SA (KLOR-CON  M) 20 MEQ tablet Take 1 tablet (20 mEq total) by mouth daily. 15 tablet 0   PROAIR  HFA 108 (90 Base) MCG/ACT inhaler  INHALE 1 TO 2 PUFFS INTO THE LUNGS EVERY 6 HOURS AS NEEDED FOR WHEEZING OR SHORTNESS OF BREATH (Patient not taking: Reported on 12/24/2023) 8.5 g 0   senna-docusate (SENOKOT-S) 8.6-50 MG tablet Take 1 tablet by mouth at bedtime. (Patient not taking: Reported on 12/24/2023) 100 tablet 0   traZODone  (DESYREL ) 50 MG tablet TAKE 1 TABLET(50 MG) BY MOUTH AT BEDTIME AS NEEDED FOR SLEEP 30 tablet 0   No current facility-administered medications for this visit.    PHYSICAL EXAMINATION: ECOG PERFORMANCE STATUS: 2 - Symptomatic, <50% confined to bed  Vitals:   01/07/24 1120  BP: 134/68  Pulse: 67  Resp: 17  Temp: (!) 97.4 F (36.3 C)  SpO2: 100%   Wt Readings from Last 3 Encounters:  01/07/24 227 lb 1.6 oz (103 kg)  12/24/23 227 lb 4.8 oz (103.1 kg)  07/28/23 234 lb (106.1 kg)     GENERAL:alert, no distress and comfortable SKIN: skin color, texture, turgor are normal, no rashes or significant lesions EYES: normal, Conjunctiva are pink and non-injected, sclera clear NECK: supple, thyroid normal size, non-tender, without nodularity LYMPH:  no palpable lymphadenopathy in the cervical, axillary  LUNGS: clear to auscultation and percussion with normal breathing effort HEART: regular rate & rhythm and no murmurs and no lower extremity edema ABDOMEN:abdomen soft, non-tender and normal bowel sounds Musculoskeletal:no cyanosis of digits and no clubbing, right lower extremity is wrapped due to a wound around her right ankle NEURO: alert & oriented x 3 with fluent speech, no focal motor/sensory deficits  Physical Exam    LABORATORY DATA:  I have reviewed the data as listed    Latest Ref Rng & Units 01/07/2024    9:55 AM 12/27/2023    5:31 AM 12/26/2023    5:52 PM  CBC  WBC 4.0 - 10.5 K/uL 12.5  9.3    Hemoglobin 12.0 - 15.0 g/dL 7.3  7.9  8.2   Hematocrit 36.0 - 46.0 % 24.4  25.5  25.9   Platelets 150 - 400 K/uL 449  243          Latest Ref Rng & Units 01/07/2024    9:55 AM 12/27/2023     5:31 AM 12/26/2023    4:12 AM  CMP  Glucose 70 - 99 mg/dL 893  884  873   BUN 8 - 23 mg/dL 8  7  8    Creatinine 0.44 - 1.00 mg/dL 9.24  9.23  9.07   Sodium 135 - 145 mmol/L 140  140  141   Potassium 3.5 - 5.1 mmol/L 3.8  4.0  4.0   Chloride 98 - 111 mmol/L 110  108  107   CO2 22 - 32 mmol/L 25  23  23    Calcium  8.9 - 10.3 mg/dL 8.7  8.6  8.3   Total Protein 6.5 - 8.1 g/dL 7.0     Total Bilirubin 0.0 - 1.2 mg/dL 0.3     Alkaline Phos 38 - 126 U/L 77     AST 15 - 41 U/L 10  ALT 0 - 44 U/L <5         RADIOGRAPHIC STUDIES: I have personally reviewed the radiological images as listed and agreed with the findings in the report. No results found.    Orders Placed This Encounter  Procedures   Sample to Blood Bank    Standing Status:   Standing    Number of Occurrences:   30    Next Expected Occurrence:   01/09/2024    Expiration Date:   01/05/2025   All questions were answered. The patient knows to call the clinic with any problems, questions or concerns. No barriers to learning was detected. The total time spent in the appointment was 30 minutes, including review of chart and various tests results, discussions about plan of care and coordination of care plan     [name], MD 01/07/2024

## 2024-01-07 NOTE — Patient Instructions (Signed)
 [place] [facility] - I am sorry I was unable to reach you today for our scheduled appointment. I work with Nooruddin, Saad, MD and am calling to support your healthcare needs. Please contact me at [phone] at your earliest convenience. I look forward to speaking with you soon.   Thank you,   Olam Ku, RN, BSN [place]  [facility], [facility] RN Care Manager Direct Dial: [phone]  Fax: [phone]

## 2024-01-09 ENCOUNTER — Ambulatory Visit (HOSPITAL_BASED_OUTPATIENT_CLINIC_OR_DEPARTMENT_OTHER): Admitting: General Surgery

## 2024-01-09 ENCOUNTER — Other Ambulatory Visit: Payer: Self-pay | Admitting: Hematology

## 2024-01-09 DIAGNOSIS — D649 Anemia, unspecified: Secondary | ICD-10-CM

## 2024-01-10 ENCOUNTER — Inpatient Hospital Stay

## 2024-01-10 NOTE — Progress Notes (Signed)
 This nurse called the patient to inquire if she was coming to her transfusion appointment today, since it was 15 mins past her appointment time. She replied that her ride was late and she thought that we would turn her away at the door. Asked the patient if she could be here by 1030 but she stated that she wouldn't be able to and wanted to reschedule the appointment. Advised the patient of her Hgb level and asked if she was having any symptoms. Patient reported only weakness at this time. Advised her to report to ER or call 911 if her condition changed. RN Thu also present during this phone conversation, in agreeance with advice given. Patient verbalized understanding. Message sent to charge RN and desk RN to call patient to reschedule.

## 2024-01-10 NOTE — Progress Notes (Deleted)
 This nurse called the patient to inquire if she was coming to her transfusion appointment today, since it was 15 mins past her appointment time. She replied that her ride was late and she thought that we would turn her away at the door. Asked the patient if she could be here by 1030 but she stated that she wouldn't be able to and wanted to reschedule the appointment. Advised the patient of her Hgb level and asked if she was having any symptoms. Patient reported only weakness at this time. Advised her to report to ER or call 911 if her condition changed. RN Thu also present during this phone conversation, in agreeance with advice given. Patient verbalized understanding. Message sent to charge RN and desk RN to call patient to reschedule.

## 2024-01-11 LAB — BPAM RBC
Blood Product Expiration Date: 202510112359
Unit Type and Rh: 600

## 2024-01-11 LAB — TYPE AND SCREEN
ABO/RH(D): A NEG
Antibody Screen: NEGATIVE
Unit division: 0

## 2024-01-12 ENCOUNTER — Telehealth: Payer: Self-pay | Admitting: *Deleted

## 2024-01-12 ENCOUNTER — Encounter: Payer: Self-pay | Admitting: *Deleted

## 2024-01-12 ENCOUNTER — Telehealth: Payer: Self-pay | Admitting: Hematology

## 2024-01-12 NOTE — Patient Instructions (Signed)
 [place] [employer] - I am sorry I was unable to reach you today for our scheduled appointment. I work with Nooruddin, Saad, MD and am calling to support your healthcare needs. Please contact me at [phone] at your earliest convenience. I look forward to speaking with you soon.   Thank you,   Olam Ku, RN, BSN [place]  [facility], [facility] RN Care Manager Direct Dial: [phone]  Fax: [phone]

## 2024-01-12 NOTE — Telephone Encounter (Signed)
 Peggy House has been scheduled and made aware of her re-scheduled appointment for her blood transfusion.

## 2024-01-13 ENCOUNTER — Ambulatory Visit: Admitting: Student

## 2024-01-13 ENCOUNTER — Telehealth: Payer: Self-pay | Admitting: Hematology

## 2024-01-13 NOTE — Telephone Encounter (Signed)
 Peggy House has been added for a lab appointment for 10/2. I LVM informing her of the appointment.

## 2024-01-14 ENCOUNTER — Ambulatory Visit: Admitting: Student

## 2024-01-14 NOTE — Progress Notes (Deleted)
 CC: ***  HPI: [name] is a 63 y.o. female living with a history stated below and presents today for ***. Please see problem based assessment and plan for additional details.  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 Use to check blood sugar before breakfast and before dinner while on steroids) 100 each 1   acetaminophen  (TYLENOL ) 325 MG tablet Take 2 tablets (650 mg total) by mouth every 6 (six) hours as needed. 100 tablet 0   aminocaproic  acid (AMICAR ) 500 MG tablet Take 2 tablets (1,000 mg total) by mouth every 12 (twelve) hours. (Patient not taking: Reported on 12/24/2023) 120 tablet 3   amLODipine  (NORVASC ) 10 MG tablet Take 1 tablet (10 mg total) by mouth daily. 30 tablet 11   citalopram  (CELEXA ) 20 MG tablet TAKE 1 TABLET(20 MG) BY MOUTH DAILY (Patient taking differently: Take 20 mg by mouth daily as needed (for anxiety).) 90 tablet 3   gabapentin  (NEURONTIN ) 100 MG capsule Take 1 capsule (100 mg total) by mouth at bedtime. 90 capsule 3   glucose blood (ACCU-CHEK GUIDE) test strip Check blood sugar 2 times per day while on steroids before breakfast and dinner (Patient taking differently: 1 each by Other route See admin instructions. Check blood sugar 2 times per day while on steroids before breakfast and dinner) 50 each 3    leptospermum manuka honey (MEDIHONEY) PSTE paste Apply 1 Application topically daily. 15 mL 0   lidocaine  4 % Place 1 patch onto the skin daily. Remove & Discard patch within 12 hours or as directed by MD 30 patch 0   lisinopril  (ZESTRIL ) 20 MG tablet Take 1 tablet (20 mg total) by mouth daily. 30 tablet 3   pantoprazole  (PROTONIX ) 40 MG tablet TAKE 1 TABLET(40 MG) BY MOUTH TWICE DAILY (Patient taking differently: Take 40 mg by mouth 2 (two) times daily as needed (for acid reflux).) 180 tablet 1   potassium chloride  SA (KLOR-CON  M) 20 MEQ tablet Take 1 tablet (20 mEq total) by mouth daily. 15 tablet 0   PROAIR  HFA 108 (90 Base) MCG/ACT inhaler INHALE 1 TO 2 PUFFS INTO THE LUNGS EVERY 6 HOURS AS NEEDED FOR WHEEZING OR SHORTNESS OF BREATH (Patient not taking: Reported on 12/24/2023) 8.5 g 0   senna-docusate (SENOKOT-S) 8.6-50 MG tablet Take 1 tablet by mouth at bedtime. (Patient not taking: Reported on 12/24/2023) 100 tablet 0   traZODone  (DESYREL ) 50 MG tablet TAKE 1 TABLET(50 MG) BY MOUTH AT BEDTIME AS NEEDED FOR SLEEP 30 tablet 0   No current facility-administered medications on file prior to visit.    Family History  Problem Relation Age of Onset   Cancer Mother        Ovarian   Ovarian cancer Mother    Diabetes Mother    Kidney disease Mother  Bleeding Disorder Mother    Diabetes type II Sister    Bleeding Disorder Sister    Diabetes Sister    Colon cancer Maternal Grandfather    Crohn's disease Maternal Grandfather    Stomach cancer Maternal Grandmother    Kidney disease Son    Bleeding Disorder Son    Dysmenorrhea Neg Hx     Social History   Socioeconomic History   Marital status: Widowed    Spouse name: Not on file   Number of children: 3   Years of education: 63   Highest education level: Not on file  Occupational History   Not on file  Tobacco Use   Smoking status: Every Day    Current packs/day: 0.50    Average packs/day: 0.5 packs/day for 30.0 years (15.0 ttl  pk-yrs)    Types: Cigarettes   Smokeless tobacco: Former    Types: Snuff    Quit date: 35   Tobacco comments:    1/2 PPD  Vaping Use   Vaping status: Never Used  Substance and Sexual Activity   Alcohol use: No    Alcohol/week: 0.0 standard drinks of alcohol   Drug use: No    Comment: last cocaine-2010   Sexual activity: Not Currently    Comment: Hysterectomy  Other Topics Concern   Not on file  Social History Narrative   Not on file   Social Drivers of Health   Financial Resource Strain: Not on file  Food Insecurity: No Food Insecurity (12/25/2023)   Hunger Vital Sign    Worried About Running Out of Food in the Last Year: Never true    Ran Out of Food in the Last Year: Never true  Transportation Needs: No Transportation Needs (12/25/2023)   PRAPARE - Administrator, Civil Service (Medical): No    Lack of Transportation (Non-Medical): No  Physical Activity: Not on file  Stress: Not on file  Social Connections: Not on file  Intimate Partner Violence: Not At Risk (12/25/2023)   Humiliation, Afraid, Rape, and Kick questionnaire    Fear of Current or Ex-Partner: No    Emotionally Abused: No    Physically Abused: No    Sexually Abused: No    Review of Systems: ROS negative except for what is noted on the assessment and plan.  There were no vitals filed for this visit.  Physical Exam  Physical Exam: Constitutional: well-appearing *** sitting in ***, in no acute distress HENT: normocephalic atraumatic, mucous membranes moist Eyes: conjunctiva non-erythematous Neck: supple Cardiovascular: regular rate and rhythm, no m/r/g Pulmonary/Chest: normal work of breathing on room air, lungs clear to auscultation bilaterally Abdominal: soft, non-tender, non-distended MSK: *** Neurological: alert & oriented x 3, 5/5 strength in bilateral upper and lower extremities, normal gait Skin: warm and dry Psych: ***  Assessment & Plan:   Assessment & Plan   No orders  of the defined types were placed in this encounter.  PMH:   Follow up Hospitalization  Patient was admitted to *** on *** and discharged on ***. She was treated for ***. Treatment for this included ***. Telephone follow up was done on *** She reports {excellent/good/fair:19992} compliance with treatment. She reports this condition is {resolved/improved/worsened:23923}.  ----------------------------------------------------------------------------------------- -  ***  No follow-ups on file.   Patient {GC/GE:[id number]::discussed with,seen with} Dr. {[id number]::Tpoopjfd,Z. Hoffman,Winfrey,Narendra,Chun,Chambliss,Lau,Machen}  Ozell Nearing, D.O. [facility] Internal Medicine, PGY-3 Phone: [phone] Date 01/14/2024 Time 8:22 AM

## 2024-01-15 ENCOUNTER — Other Ambulatory Visit: Payer: Self-pay

## 2024-01-15 ENCOUNTER — Inpatient Hospital Stay: Attending: Hematology

## 2024-01-15 ENCOUNTER — Inpatient Hospital Stay: Admitting: Licensed Clinical Social Worker

## 2024-01-15 ENCOUNTER — Inpatient Hospital Stay

## 2024-01-15 VITALS — BP 174/82 | HR 85 | Temp 98.4°F | Resp 20

## 2024-01-15 DIAGNOSIS — D500 Iron deficiency anemia secondary to blood loss (chronic): Secondary | ICD-10-CM

## 2024-01-15 DIAGNOSIS — I780 Hereditary hemorrhagic telangiectasia: Secondary | ICD-10-CM

## 2024-01-15 DIAGNOSIS — D649 Anemia, unspecified: Secondary | ICD-10-CM

## 2024-01-15 DIAGNOSIS — Z95828 Presence of other vascular implants and grafts: Secondary | ICD-10-CM

## 2024-01-15 DIAGNOSIS — Q2730 Arteriovenous malformation, site unspecified: Secondary | ICD-10-CM | POA: Diagnosis present

## 2024-01-15 LAB — CBC WITH DIFFERENTIAL (CANCER CENTER ONLY)
Abs Immature Granulocytes: 0.05 K/uL (ref 0.00–0.07)
Basophils Absolute: 0 K/uL (ref 0.0–0.1)
Basophils Relative: 1 %
Eosinophils Absolute: 0.1 K/uL (ref 0.0–0.5)
Eosinophils Relative: 1 %
HCT: 21.4 % — ABNORMAL LOW (ref 36.0–46.0)
Hemoglobin: 6.4 g/dL — CL (ref 12.0–15.0)
Immature Granulocytes: 1 %
Lymphocytes Relative: 13 %
Lymphs Abs: 1.1 K/uL (ref 0.7–4.0)
MCH: 24 pg — ABNORMAL LOW (ref 26.0–34.0)
MCHC: 29.9 g/dL — ABNORMAL LOW (ref 30.0–36.0)
MCV: 80.1 fL (ref 80.0–100.0)
Monocytes Absolute: 0.5 K/uL (ref 0.1–1.0)
Monocytes Relative: 6 %
Neutro Abs: 7 K/uL (ref 1.7–7.7)
Neutrophils Relative %: 78 %
Platelet Count: 354 K/uL (ref 150–400)
RBC: 2.67 MIL/uL — ABNORMAL LOW (ref 3.87–5.11)
RDW: 21.2 % — ABNORMAL HIGH (ref 11.5–15.5)
WBC Count: 8.8 K/uL (ref 4.0–10.5)
nRBC: 0 % (ref 0.0–0.2)

## 2024-01-15 LAB — PREPARE RBC (CROSSMATCH)

## 2024-01-15 LAB — CMP (CANCER CENTER ONLY)
ALT: 5 U/L (ref 0–44)
AST: 10 U/L — ABNORMAL LOW (ref 15–41)
Albumin: 3.9 g/dL (ref 3.5–5.0)
Alkaline Phosphatase: 76 U/L (ref 38–126)
Anion gap: 6 (ref 5–15)
BUN: 11 mg/dL (ref 8–23)
CO2: 25 mmol/L (ref 22–32)
Calcium: 9.3 mg/dL (ref 8.9–10.3)
Chloride: 111 mmol/L (ref 98–111)
Creatinine: 0.81 mg/dL (ref 0.44–1.00)
GFR, Estimated: >60 mL/min (ref >60)
Glucose, Bld: 111 mg/dL — ABNORMAL HIGH (ref 70–99)
Potassium: 3.8 mmol/L (ref 3.5–5.1)
Sodium: 142 mmol/L (ref 135–145)
Total Bilirubin: 0.2 mg/dL (ref 0.0–1.2)
Total Protein: 7.3 g/dL (ref 6.5–8.1)

## 2024-01-15 LAB — SAMPLE TO BLOOD BANK

## 2024-01-15 LAB — FERRITIN: Ferritin: 11 ng/mL (ref 11–307)

## 2024-01-15 MED ORDER — METHYLPREDNISOLONE SODIUM SUCC 125 MG IJ SOLR
125.0000 mg | Freq: Once | INTRAMUSCULAR | Status: AC
  Administered 2024-01-15: 125 mg via INTRAVENOUS
  Filled 2024-01-15: qty 2

## 2024-01-15 MED ORDER — SODIUM CHLORIDE 0.9% IV SOLUTION: 250.0000 mL | INTRAVENOUS | Status: DC

## 2024-01-15 MED ORDER — CETIRIZINE HCL 10 MG/ML IV SOLN
10.0000 mg | Freq: Once | INTRAVENOUS | Status: AC
  Administered 2024-01-15: 10 mg via INTRAVENOUS
  Filled 2024-01-15: qty 1

## 2024-01-15 MED ORDER — SODIUM CHLORIDE 0.9 % IV SOLN: INTRAVENOUS | Status: DC

## 2024-01-15 MED ORDER — ACETAMINOPHEN 325 MG PO TABS
650.0000 mg | ORAL_TABLET | Freq: Once | ORAL | Status: AC
  Administered 2024-01-15: 650 mg via ORAL
  Filled 2024-01-15: qty 2

## 2024-01-15 MED ORDER — SODIUM CHLORIDE 0.9 % IV SOLN
1000.0000 mg | Freq: Once | INTRAVENOUS | Status: AC
  Administered 2024-01-15: 1000 mg via INTRAVENOUS
  Filled 2024-01-15: qty 10

## 2024-01-15 NOTE — Patient Instructions (Signed)
 Ferric Derisomaltose Injection What is this medication? FERRIC DERISOMALTOSE (FER ik der EYE soe MAWL tose) treats low levels of iron  in your body (iron  deficiency anemia). Iron  is a mineral that plays an important role in making red blood cells, which carry oxygen from your lungs to the rest of your body. This medicine may be used for other purposes; ask your health care provider or pharmacist if you have questions. COMMON BRAND NAME(S): MONOFERRIC What should I tell my care team before I take this medication? They need to know if you have any of these conditions: High levels of iron  in the blood An unusual or allergic reaction to iron , other medications, foods, dyes, or preservatives Pregnant or trying to get pregnant Breastfeeding How should I use this medication? This medication is injected into a vein. It is given by your care team in a hospital or clinic setting. Talk to your care team about the use of this medication in children. Special care may be needed. Overdosage: If you think you have taken too much of this medicine contact a poison control center or emergency room at once. NOTE: This medicine is only for you. Do not share this medicine with others. What if I miss a dose? It is important not to miss your dose. Call your care team if you are unable to keep an appointment. What may interact with this medication? Do not take this medication with any of the following: Deferoxamine Dimercaprol Other iron  products This list may not describe all possible interactions. Give your health care provider a list of all the medicines, herbs, non-prescription drugs, or dietary supplements you use. Also tell them if you smoke, drink alcohol, or use illegal drugs. Some items may interact with your medicine. What should I watch for while using this medication? Visit your care team for regular checks on your progress. Tell your care team if your symptoms do not start to get better or if they get  worse. You may need blood work done while you are taking this medication. You may need to eat more foods that contain iron . Talk to your care team. Foods that contain iron  include whole grains or cereals, dried fruits, beans, peas, leafy green vegetables, and organ meats (liver, kidney). What side effects may I notice from receiving this medication? Side effects that you should report to your care team as soon as possible: Allergic reactions--skin rash, itching, hives, swelling of the face, lips, tongue, or throat Low blood pressure--dizziness, feeling faint or lightheaded, blurry vision Shortness of breath Side effects that usually do not require medical attention (report to your care team if they continue or are bothersome): Flushing Headache Joint pain Muscle pain Nausea Pain, redness, or irritation at injection site This list may not describe all possible side effects. Call your doctor for medical advice about side effects. You may report side effects to FDA at 1-800-FDA-1088. Where should I keep my medication? This medication is given in a hospital or clinic. It will not be stored at home. NOTE: This sheet is a summary. It may not cover all possible information. If you have questions about this medicine, talk to your doctor, pharmacist, or health care provider.  Blood Transfusion, Adult A blood transfusion is a procedure in which you receive blood or a type of blood cell (blood component) through an IV. You may need a blood transfusion when you have a low blood count, which is a low number of any blood cell. This may result from a bleeding  disorder, illness, injury, or surgery. The blood may come from a donor, or you may be able to have your own blood collected and stored (autologous blood donation) before a planned surgery. The blood given in a transfusion may be made up of different blood components. You may receive: Red blood cells. These carry oxygen to the cells in the  body. Platelets. These help your blood to clot. Plasma. This is the liquid part of your blood. It carries proteins and other substances throughout the body. White blood cells. These help you fight infections. If you have hemophilia or another clotting disorder, you may also receive other types of blood products. Depending on the type of blood product, this procedure may take 1-4 hours to complete. Tell a health care provider about: Any bleeding problems you have. Any previous reactions you have had during a blood transfusion. Any allergies you have. All medicines you are taking, including vitamins, herbs, eye drops, creams, and over-the-counter medicines. Any surgeries you have had. Any medical conditions you have. Whether you are pregnant or may be pregnant. What are the risks? Talk with your health care provider about risks. The most common problems include: A mild allergic reaction, such as red, swollen areas of skin (hives) and itching. Fever or chills. This may be the body's response to new blood cells received. This may occur during or up to 4 hours after the transfusion. More serious problems may include: A serious allergic reaction that causes difficulty breathing or swelling around the face and lips. Transfusion-associated circulatory overload (TACO), or too much fluid in the lungs. This may cause breathing problems. Transfusion-related acute lung injury (TRALI), which causes breathing difficulty and low oxygen in the blood. This can occur within hours of the transfusion or several days later. Iron  overload. This can happen after receiving many blood transfusions over a period of time. Infection or virus being transmitted. This is rare because donated blood is carefully tested before it is given. Hemolytic transfusion reaction. This is rare. It happens when the body's defense system (immune system)tries to attack the new blood cells. Symptoms may include fever, chills, nausea, low  blood pressure, and low back or chest pain. Transfusion-associated graft-versus-host disease (TAGVHD). This is rare. It happens when donated cells attack the body's healthy tissues. What happens before the procedure? You will have a blood test to check your blood type. This test is done to know what kind of blood your body will accept and to match it to the donor blood. If you are going to have a planned surgery, you may be able to do an autologous blood donation. This may be done in case you need to have a transfusion. You will have your temperature, blood pressure, and pulse checked before the transfusion. If you have had an allergic reaction to a transfusion in the past, you may be given medicine to help prevent a reaction. This medicine may be given to you by mouth (orally) or through an IV. What happens during the procedure?  An IV will be inserted into one of your veins. The bag of blood will be attached to your IV. The blood will then enter through your vein. Your temperature, blood pressure, and pulse will be monitored during the transfusion. This monitoring is done to detect early signs of a transfusion reaction. Tell your nurse right away if you have any of these symptoms during the transfusion: Shortness of breath or trouble breathing. Chest or back pain. Fever or chills. Itching or hives. If  you have any signs or symptoms of a reaction, your transfusion will be stopped and you may be given medicine. When the transfusion is complete, your IV will be removed. Pressure may be applied to the IV site for a few minutes. A bandage (dressing)will be applied. The procedure may vary among health care providers and hospitals. What happens after the procedure? Your temperature, blood pressure, pulse, breathing rate, and blood oxygen level will be monitored until you leave the hospital or clinic. Your blood may be tested to see how you have responded to the transfusion. You may be warmed with  fluids or blankets to maintain a normal body temperature. If you receive your blood transfusion in an outpatient setting, you will be told whom to contact to report any reactions. Where to find more information Visit the American Red Cross: redcross.org Summary A blood transfusion is a procedure in which you receive blood or a type of blood cell (blood component) through an IV. The blood given in a transfusion may be made up of different blood components. You may receive red blood cells, platelets, plasma, or white blood cells depending on the condition treated. Your temperature, blood pressure, and pulse will be monitored before, during, and after the transfusion. After the transfusion, your blood may be tested to see how your body has responded. This information is not intended to replace advice given to you by your health care provider. Make sure you discuss any questions you have with your health care provider. Document Revised: 06/29/2021 Document Reviewed: 06/29/2021 Elsevier Patient Education  2024 Elsevier Inc.  2024 Elsevier/Gold Standard (2022-11-20 00:00:00)

## 2024-01-15 NOTE — Progress Notes (Signed)
 [facility] CSW Progress Note  Visual merchandiser met with pt in infusion to follow-up on need for community resources.    Interventions: Pt reports she will need to vacate the apartment she is living in on October 12th and does not have anywhere to go.  Pt has been living with her son and his girlfriend.  According to pt she does not know where they will be moving, but she will not be able to move in with them.  Pt has another son residing in [place] and 1 in [place], but states she can not move in with them either as they do not have the room.  Pt reports she has applied for section 8 housing, but when she checked on it was told they are unable to find her application.  There is reportedly an apartment that may be available in approximately 1 week, but this is not guaranteed.  Pt has multiple health issues.  CSW spoke w/ pt at length regarding options.  Given pt's health issues, if she were to move into an apartment independently it would be advisable to apply for PCA services.  It may be safer for pt to look into assisted living for additional support.  Pt verbalized agreement to consider this option.  CSW reached out to Delon Buys at [employer] [facility] who will contact pt 10/3 to discuss possible assisted living options.  Pt to follow up w/ CSW as needed.        Follow Up Plan:  Patient will contact CSW with any support or resource needs    Devere JONELLE Manna, LCSW Clinical Social Worker [facility]    Patient is participating in a Managed Medicaid Plan:  Yes

## 2024-01-15 NOTE — Progress Notes (Signed)
 Spoke with Garrel in [facility] to confirm T&S & Prepare orders are active in their system.  Garrel confirmed orders and will prepare the blood today.    Verbal order w/readback from Dr. Lanny, OK to run PRBCs at 300ml/hr after the initial blood transfusion monitoring at 125ml/hr.

## 2024-01-16 LAB — TYPE AND SCREEN
ABO/RH(D): A NEG
Antibody Screen: NEGATIVE
Unit division: 0
Unit division: 0

## 2024-01-16 LAB — BPAM RBC
Blood Product Expiration Date: 202510172359
Blood Product Expiration Date: 202511012359
ISSUE DATE / TIME: 202510021219
ISSUE DATE / TIME: 202510021219
Unit Type and Rh: 600
Unit Type and Rh: 600

## 2024-01-19 ENCOUNTER — Telehealth: Payer: Self-pay | Admitting: Hematology

## 2024-01-19 ENCOUNTER — Inpatient Hospital Stay: Admitting: Licensed Clinical Social Worker

## 2024-01-19 DIAGNOSIS — D500 Iron deficiency anemia secondary to blood loss (chronic): Secondary | ICD-10-CM

## 2024-01-19 NOTE — Progress Notes (Signed)
 [facility] CSW Progress Note    Interventions: CSW received a message from Garceno at [employer] for [employer].  Per Delon, she and pt have spoken on the phone.  Delon provided pt w/ contact information for low income assisted living facilities w/ beds currently available.  Per Delon pt states she will go over the options w/ her son and decide how she wishes to move forward.        Follow Up Plan:  Patient will contact CSW with any support or resource needs    Devere JONELLE Manna, LCSW Clinical Social Worker [facility]    Patient is participating in a Managed Medicaid Plan:  Yes

## 2024-01-19 NOTE — Telephone Encounter (Signed)
 Per a staff message received on 10/4, I have added Peggy House to have a lab on 10/22 at 12:45. I asked her to return my call to re-schedule if needed.

## 2024-01-21 ENCOUNTER — Inpatient Hospital Stay

## 2024-01-23 ENCOUNTER — Telehealth: Payer: Self-pay | Admitting: Hematology

## 2024-01-23 NOTE — Telephone Encounter (Signed)
 Peggy House called in to reschedule her lab appointment.

## 2024-01-23 NOTE — Telephone Encounter (Signed)
 I contacted Peggy House and informed her that per her request she has been rescheduled for her lab appointment. I asked that she return my call if she needs to reschedule.

## 2024-01-27 ENCOUNTER — Inpatient Hospital Stay

## 2024-01-30 DIAGNOSIS — T8130XA Disruption of wound, unspecified, initial encounter: Secondary | ICD-10-CM | POA: Diagnosis not present

## 2024-01-30 DIAGNOSIS — T84629A Infection and inflammatory reaction due to internal fixation device of unspecified bone of leg, initial encounter: Secondary | ICD-10-CM | POA: Diagnosis not present

## 2024-01-30 DIAGNOSIS — M86271 Subacute osteomyelitis, right ankle and foot: Secondary | ICD-10-CM | POA: Diagnosis not present

## 2024-02-04 ENCOUNTER — Inpatient Hospital Stay

## 2024-02-09 ENCOUNTER — Encounter (HOSPITAL_BASED_OUTPATIENT_CLINIC_OR_DEPARTMENT_OTHER): Admitting: General Surgery

## 2024-02-12 ENCOUNTER — Other Ambulatory Visit: Payer: Self-pay

## 2024-02-12 ENCOUNTER — Telehealth: Payer: Self-pay

## 2024-02-12 NOTE — Telephone Encounter (Signed)
 Received telephone call from the patient inquiring the name if the IV iron  she gets administered. Patient stated she was at her MRI appt and the MRI tam is requesting the name of the IV iron . Let patient know that she received Monoferric. Patient/staff on speaker phone voiced understanding and did not have any further questions or concerns.

## 2024-02-18 ENCOUNTER — Other Ambulatory Visit: Payer: Self-pay

## 2024-02-18 ENCOUNTER — Inpatient Hospital Stay (HOSPITAL_BASED_OUTPATIENT_CLINIC_OR_DEPARTMENT_OTHER): Admitting: Hematology

## 2024-02-18 ENCOUNTER — Inpatient Hospital Stay: Attending: Hematology

## 2024-02-18 ENCOUNTER — Telehealth: Payer: Self-pay

## 2024-02-18 ENCOUNTER — Other Ambulatory Visit (HOSPITAL_COMMUNITY): Payer: Self-pay

## 2024-02-18 VITALS — BP 120/70 | HR 74 | Temp 97.8°F | Resp 17 | Wt 227.0 lb

## 2024-02-18 DIAGNOSIS — I780 Hereditary hemorrhagic telangiectasia: Secondary | ICD-10-CM | POA: Insufficient documentation

## 2024-02-18 DIAGNOSIS — Q2730 Arteriovenous malformation, site unspecified: Secondary | ICD-10-CM | POA: Insufficient documentation

## 2024-02-18 DIAGNOSIS — D500 Iron deficiency anemia secondary to blood loss (chronic): Secondary | ICD-10-CM

## 2024-02-18 DIAGNOSIS — D649 Anemia, unspecified: Secondary | ICD-10-CM

## 2024-02-18 LAB — CBC WITH DIFFERENTIAL (CANCER CENTER ONLY)
Abs Immature Granulocytes: 0.02 K/uL (ref 0.00–0.07)
Basophils Absolute: 0 K/uL (ref 0.0–0.1)
Basophils Relative: 1 %
Eosinophils Absolute: 0.1 K/uL (ref 0.0–0.5)
Eosinophils Relative: 1 %
HCT: 21.7 % — ABNORMAL LOW (ref 36.0–46.0)
Hemoglobin: 6.2 g/dL — CL (ref 12.0–15.0)
Immature Granulocytes: 0 %
Lymphocytes Relative: 14 %
Lymphs Abs: 1.2 K/uL (ref 0.7–4.0)
MCH: 24 pg — ABNORMAL LOW (ref 26.0–34.0)
MCHC: 28.6 g/dL — ABNORMAL LOW (ref 30.0–36.0)
MCV: 84.1 fL (ref 80.0–100.0)
Monocytes Absolute: 0.5 K/uL (ref 0.1–1.0)
Monocytes Relative: 6 %
Neutro Abs: 6.9 K/uL (ref 1.7–7.7)
Neutrophils Relative %: 78 %
Platelet Count: 290 K/uL (ref 150–400)
RBC: 2.58 MIL/uL — ABNORMAL LOW (ref 3.87–5.11)
RDW: 22.1 % — ABNORMAL HIGH (ref 11.5–15.5)
WBC Count: 8.8 K/uL (ref 4.0–10.5)
nRBC: 0 % (ref 0.0–0.2)

## 2024-02-18 LAB — CMP (CANCER CENTER ONLY)
ALT: <5 U/L (ref 0–44)
AST: 10 U/L — ABNORMAL LOW (ref 15–41)
Albumin: 3.9 g/dL (ref 3.5–5.0)
Alkaline Phosphatase: 93 U/L (ref 38–126)
Anion gap: 7 (ref 5–15)
BUN: 7 mg/dL — ABNORMAL LOW (ref 8–23)
CO2: 25 mmol/L (ref 22–32)
Calcium: 9.1 mg/dL (ref 8.9–10.3)
Chloride: 110 mmol/L (ref 98–111)
Creatinine: 0.87 mg/dL (ref 0.44–1.00)
GFR, Estimated: >60 mL/min (ref >60)
Glucose, Bld: 92 mg/dL (ref 70–99)
Potassium: 3.7 mmol/L (ref 3.5–5.1)
Sodium: 142 mmol/L (ref 135–145)
Total Bilirubin: 0.3 mg/dL (ref 0.0–1.2)
Total Protein: 7 g/dL (ref 6.5–8.1)

## 2024-02-18 LAB — IRON AND IRON BINDING CAPACITY (CC-WL,HP ONLY)
Iron: 18 ug/dL — ABNORMAL LOW (ref 28–170)
Saturation Ratios: 6 % — ABNORMAL LOW (ref 10.4–31.8)
TIBC: 314 ug/dL (ref 250–450)
UIBC: 296 ug/dL (ref 148–442)

## 2024-02-18 LAB — SAMPLE TO BLOOD BANK

## 2024-02-18 LAB — FERRITIN: Ferritin: 21 ng/mL (ref 11–307)

## 2024-02-18 LAB — PREPARE RBC (CROSSMATCH)

## 2024-02-18 MED ORDER — AMINOCAPROIC ACID 500 MG PO TABS
1000.0000 mg | ORAL_TABLET | Freq: Two times a day (BID) | ORAL | 3 refills | Status: DC
  Filled 2024-02-18: qty 120, 30d supply, fill #0
  Filled 2024-03-17 – 2024-03-19 (×2): qty 60, 15d supply, fill #1

## 2024-02-18 NOTE — Addendum Note (Signed)
 Addended by: LANNY CALLANDER on: 02/18/2024 04:21 PM   Modules accepted: Orders

## 2024-02-18 NOTE — Progress Notes (Signed)
 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 -she has required multiple blood transfusions and APCs since 2019. 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 -She had multiple prolonged hospital stay due to severe GI bleeding.   -s/p IR embolization of gastric artery branch vessels on 12/10/17, cauterization of stomach for severe GI bleed in 11/2018, and epinephrine  injection for bleeding ulcer on 11/03/20. -She has required frequent blood transfusion, IV iron  and bevacizumab  -She does respond to treatment, anemia improved with IV iron  and bevacizumab , but she is not compliant with her appointments.  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 Previous high-dose iron  infusion improved symptoms. Missed follow-up appointments. Transportation assistance now in place. - Scheduled weekly blood transfusions and IV iron  infusions for four  weeks. - Refilled Amiqar prescription to manage bleeding. - Ensured transportation assistance for appointments.  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 No active bleeding, but significant leakage. Under care of a vascular surgeon. - Administered bevacizumab  to aid in wound healing.  Recurrent epistaxis due to hereditary hemorrhagic telangiectasia (HHT) Recurrent epistaxis attributed to HHT. No current active bleeding reported. - Continue Amiqar to manage bleeding episodes.   Plan - Labs reviewed, hemoglobin 6.2, will give 2 unit of blood transfusion on Saturday this week -Ferritin 21, will give 1 dose Monoferric next week - Lab weekly, blood and IV iron  as needed - Follow-up in 4 weeks - I again discussed the importance of compliance, she has frequent no-shows.  Discussed the use of AI scribe software for clinical note transcription with the patient, who gave verbal consent to proceed.  History of Present Illness Peggy House is a 63 year old female with anemia from chronic bleeding and hereditary hemorrhagic telangiectasia (HHT) who presents for follow-up.  She has an open wound on the side of her foot, which is long and leaks fluid without active bleeding. An MRI was performed due to concerns about poor circulation, and there is a possibility of bone deterioration.  She experiences epistaxis and coughing but no gum bleeding. Her hemoglobin level is low at 6.2. She missed several follow-up appointments in October, which were scheduled for monitoring her condition.  She is taking Amicar  to help stop bleeding, but it needs to be refilled. She also mentions leg swelling and is on other medications managed by her family doctor.     All other systems were reviewed with the patient  and are negative.  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    SURGICAL HISTORY: Past Surgical History:  Procedure Laterality Date   ABDOMINAL HYSTERECTOMY     APPLICATION OF WOUND VAC Right 07/28/2023   Procedure: APPLICATION, WOUND VAC;  Surgeon: Lowery Estefana RAMAN, DO;  Location: [facility];  Service: Plastics;  Laterality: Right;   APPLICATION, SKIN SUBSTITUTE Right 07/28/2023   Procedure: APPLICATION, SKIN SUBSTITUTE;  Surgeon: Lowery Estefana RAMAN, DO;  Location: [facility];  Service: Plastics;  Laterality: Right;   CARPAL TUNNEL RELEASE  05/13/2011   Procedure: CARPAL TUNNEL RELEASE;  Surgeon: Eva Elsie Herring, MD;  Location: [facility];  Service: Orthopedics;  Laterality: Left;   COLONOSCOPY N/A 03/02/2020   Procedure: COLONOSCOPY;  Surgeon: Donnald Charleston, MD;  Location: [facility];  Service: Endoscopy;  Laterality: N/A;   COLONOSCOPY WITH PROPOFOL  N/A 04/28/2014   Procedure: COLONOSCOPY WITH PROPOFOL ;  Surgeon: Charleston Donnald GAILS, MD;  Location: [facility] END[facility]COPY;  Service: Endoscopy;  Laterality: N/A;   DG TOES*L*  2/10   rt   DILATION AND CURETTAGE OF UTERUS     ENTEROSCOPY N/A 10/17/2017   Procedure: ENTEROSCOPY;  Surgeon: Elicia Claw, MD;  Location: [facility];  Service: Gastroenterology;  Laterality: N/A;   ESOPHAGOGASTRODUODENOSCOPY N/A 04/10/2014   Procedure: ESOPHAGOGASTRODUODENOSCOPY (EGD);  Surgeon: Jerrell KYM Sol, MD;  Location: [facility] ENDOSCOPY;  Service: Endoscopy;  Laterality: N/A;   ESOPHAGOGASTRODUODENOSCOPY N/A 05/10/2017   Procedure: ESOPHAGOGASTRODUODENOSCOPY (EGD);  Surgeon: Donnald Charleston, MD;  Location: [facility] ENDOSCOPY;  Service: Endoscopy;   Laterality: N/A;   ESOPHAGOGASTRODUODENOSCOPY N/A 09/22/2017   Procedure: ESOPHAGOGASTRODUODENOSCOPY (EGD);  Surgeon: Rosalie Kitchens, MD;  Location: [facility] ENDOSCOPY;  Service: Endoscopy;  Laterality: N/A;  bedside   ESOPHAGOGASTRODUODENOSCOPY N/A 03/02/2020   Procedure: ESOPHAGOGASTRODUODENOSCOPY (EGD);  Surgeon: Donnald Charleston, MD;  Location: [facility];  Service: Endoscopy;  Laterality: N/A;   ESOPHAGOGASTRODUODENOSCOPY N/A 11/03/2020   Procedure: ESOPHAGOGASTRODUODENOSCOPY (EGD);  Surgeon: Sol Jerrell, MD;  Location: [facility] END[facility]COPY;  Service: Endoscopy;  Laterality: N/A;   ESOPHAGOGASTRODUODENOSCOPY N/A 04/12/2022   Procedure: ESOPHAGOGASTRODUODENOSCOPY (EGD);  Surgeon: Rosalie Kitchens, MD;  Location: [facility];  Service: Gastroenterology;  Laterality: N/A;   ESOPHAGOGASTRODUODENOSCOPY N/A 05/27/2022   Procedure: ESOPHAGOGASTRODUODENOSCOPY (EGD);  Surgeon: Burnette Elsie, MD;  Location: [facility];  Service: Gastroenterology;  Laterality: N/A;   ESOPHAGOGASTRODUODENOSCOPY N/A 09/27/2022   Procedure: ESOPHAGOGASTRODUODENOSCOPY (EGD);  Surgeon: Kriss Estefana DEL, DO;  Location: [facility] ENDOSCOPY;  Service: Gastroenterology;  Laterality: N/A;   ESOPHAGOGASTRODUODENOSCOPY (EGD) WITH PROPOFOL  N/A 04/27/2014   Procedure: ESOPHAGOGASTRODUODENOSCOPY (EGD) WITH PROPOFOL ;  Surgeon: Charleston Donnald GAILS, MD;  Location: [facility] ENDOSCOPY;  Service: Endoscopy;  Laterality: N/A;  possible apc   ESOPHAGOGASTRODUODENOSCOPY (EGD) WITH PROPOFOL  N/A 09/30/2017   Procedure: ESOPHAGOGASTRODUODENOSCOPY (EGD) WITH PROPOFOL ;  Surgeon: Saintclair Jasper, MD;  Location: [facility] ENDOSCOPY;  Service: Gastroenterology;  Laterality: N/A;   ESOPHAGOGASTRODUODENOSCOPY (EGD) WITH PROPOFOL  N/A 10/01/2017   Procedure: ESOPHAGOGASTRODUODENOSCOPY (EGD) WITH PROPOFOL ;  Surgeon: Saintclair Jasper, MD;  Location: [facility] ENDOSCOPY;  Service: Gastroenterology;  Laterality: N/A;   ESOPHAGOGASTRODUODENOSCOPY (EGD) WITH PROPOFOL  N/A 10/08/2017   Procedure:  ESOPHAGOGASTRODUODENOSCOPY (EGD) WITH PROPOFOL ;  Surgeon: Elicia Claw, MD;  [facility]: [facility];  Service: Gastroenterology;  Laterality: N/A;   ESOPHAGOGASTRODUODENOSCOPY (EGD) WITH PROPOFOL  N/A 10/17/2017   Procedure: ESOPHAGOGASTRODUODENOSCOPY (EGD) WITH PROPOFOL ;  Surgeon: Elicia Claw, MD;  [facility]: [facility];  Service: Gastroenterology;  Laterality: N/A;   ESOPHAGOGASTRODUODENOSCOPY (EGD) WITH PROPOFOL  N/A 10/19/2017   Procedure: ESOPHAGOGASTRODUODENOSCOPY (EGD) WITH PROPOFOL ;  Surgeon: Elicia Claw, MD;  [facility];  Service: Gastroenterology;  Laterality: N/A;   ESOPHAGOGASTRODUODENOSCOPY (EGD) WITH PROPOFOL  N/A 12/04/2018   Procedure: ESOPHAGOGASTRODUODENOSCOPY (EGD) WITH PROPOFOL ;  Surgeon: Dianna Specking, MD;  Location: [facility];  Service: Endoscopy;  Laterality: N/A;   GIVENS CAPSULE STUDY N/A 10/02/2017   Procedure: GIVENS CAPSULE STUDY;  Surgeon: Saintclair Jasper, MD;  Location: [facility] ENDOSCOPY;  Service: Gastroenterology;  Laterality: N/A;   GIVENS CAPSULE STUDY N/A 10/08/2017   Procedure: GIVENS CAPSULE STUDY;  Surgeon: Elicia Claw, MD;  [facility]: [facility];  Service: Gastroenterology;  Laterality: N/A;  endoscopic placement of capsule   GIVENS CAPSULE STUDY N/A 03/02/2020   Procedure: GIVENS CAPSULE STUDY;  Surgeon: Donnald Charleston, MD;  Location: [facility];  Service: Endoscopy;  Laterality: N/A;   HARDWARE REMOVAL Right 07/15/2023   Procedure: REMOVAL, HARDWARE;  Surgeon: Sherida Adine BROCKS, MD;  [facility]: [facility];  Service: Orthopedics;  Laterality: Right;   HEMOSTASIS CLIP PLACEMENT  12/04/2018   Procedure: HEMOSTASIS CLIP PLACEMENT;  Surgeon: Dianna Specking, MD;  Location: [facility];  Service: Endoscopy;;   HEMOSTASIS CLIP PLACEMENT  05/27/2022   Procedure: HEMOSTASIS CLIP PLACEMENT;  Surgeon: Burnette Fallow, MD;  Location: [facility];  Service: Gastroenterology;;   HEMOSTASIS CLIP PLACEMENT  09/27/2022   Procedure: HEMOSTASIS CLIP  PLACEMENT;  Surgeon: Kriss Estefana DEL, DO;  Location: [facility] ENDOSCOPY;  Service: Gastroenterology;;   HEMOSTASIS CONTROL  05/27/2022   Procedure: HEMOSTASIS CONTROL;  Surgeon: Burnette Fallow, MD;  Location: [facility];  Service: Gastroenterology;;   HOT HEMOSTASIS N/A 04/27/2014   Procedure: HOT HEMOSTASIS (ARGON PLASMA COAGULATION/BICAP);  Surgeon: Charleston Donnald GAILS, MD;  Location: [facility] ENDOSCOPY;  Service: Endoscopy;  Laterality: N/A;   HOT HEMOSTASIS N/A 09/30/2017   Procedure: HOT HEMOSTASIS (ARGON PLASMA COAGULATION/BICAP);  Surgeon: Saintclair Jasper, MD;  Location: [facility] ENDOSCOPY;  Service: Gastroenterology;  Laterality: N/A;   HOT HEMOSTASIS N/A 10/01/2017   Procedure: HOT HEMOSTASIS (ARGON PLASMA COAGULATION/BICAP);  Surgeon: Saintclair Jasper, MD;  Location: [facility] ENDOSCOPY;  Service: Gastroenterology;  Laterality: N/A;   HOT HEMOSTASIS N/A 10/17/2017   Procedure: HOT HEMOSTASIS (ARGON PLASMA COAGULATION/BICAP);  Surgeon: Elicia Claw, MD;  Location: [facility] ENDOSCOPY;  Service: Gastroenterology;  Laterality: N/A;   HOT HEMOSTASIS N/A 10/19/2017   Procedure: HOT HEMOSTASIS (ARGON PLASMA COAGULATION/BICAP);  Surgeon: Elicia Claw, MD;  Location: [facility] ENDOSCOPY;  Service: Gastroenterology;  Laterality: N/A;   HOT HEMOSTASIS N/A 03/02/2020   Procedure: HOT HEMOSTASIS (ARGON PLASMA COAGULATION/BICAP);  Surgeon: Donnald Charleston, MD;  Location: [facility];  Service: Endoscopy;  Laterality: N/A;   HOT HEMOSTASIS N/A 04/12/2022   Procedure: HOT HEMOSTASIS (ARGON PLASMA COAGULATION/BICAP);  Surgeon: Rosalie Kitchens, MD;  Location: [facility];  Service: Gastroenterology;  Laterality: N/A;   INCISION AND DRAINAGE OF WOUND Right 07/21/2023   Procedure: IRRIGATION AND DEBRIDEMENT WOUND;  Surgeon: Sherida Adine BROCKS, MD;  Location: [facility];  Service: Orthopedics;  Laterality: Right;  RIGHT ANKLE WOUND I&D   INCISION AND DRAINAGE OF WOUND Right 07/28/2023   Procedure: IRRIGATION AND DEBRIDEMENT WOUND;  Surgeon: Lowery Estefana RAMAN, DO;  Location: [facility];  Service: Plastics;  Laterality: Right;  rrigation and debridement of right ankle wound with placement of myriad and wound vac   IR IMAGING GUIDED PORT INSERTION  07/08/2018   IRRIGATION AND DEBRIDEMENT POSTERIOR HIP Right 07/15/2023   Procedure: IRRIGATION AND DEBRIDEMENT, OPEN FRACTURE;  Surgeon: Sherida Adine BROCKS, MD;  Location: [facility];  Service: Orthopedics;  Laterality: Right;   L shoulder Surgery  2011   ORIF ANKLE FRACTURE Right 04/24/2023   Procedure: OPEN REDUCTION INTERNAL FIXATION (ORIF) ANKLE FRACTURE;  Surgeon: Sherida Adine BROCKS, MD;  Location: [facility];  Service: Orthopedics;  Laterality: Right;   POLYPECTOMY  03/02/2020   Procedure: POLYPECTOMY;  Surgeon: Donnald Charleston, MD;  Location: [facility];  Service: Endoscopy;;   SCLEROTHERAPY  11/03/2020   Procedure: MATIAS;  Surgeon: Dianna Specking, MD;  Location: [facility] ENDOSCOPY;  Service: Endoscopy;;   SUBMUCOSAL INJECTION  09/22/2017   Procedure: SUBMUCOSAL INJECTION;  Surgeon: Rosalie Kitchens, MD;  Location: [facility] ENDOSCOPY;  Service: Endoscopy;;   SUBMUCOSAL INJECTION  12/04/2018   Procedure: SUBMUCOSAL INJECTION;  Surgeon: Dianna Specking, MD;  Location: [facility];  Service: Endoscopy;;    I have reviewed the social history and family history with the patient and they are unchanged from previous note.  ALLERGIES:  is allergic to feraheme  [ferumoxytol ], nsaids, wasp venom, tomato, and iron  (ferrous sulfate ) [ferrous sulfate  er].  MEDICATIONS:  Current Outpatient Medications  Medication Sig Dispense Refill   Accu-Chek Softclix Lancets lancets Use to check blood sugar before breakfast and before dinner while on steroids (Patient taking differently: 1 each by Other route See admin instructions. Use to check blood sugar before breakfast and before dinner while on steroids) 100 each 1   acetaminophen  (TYLENOL ) 325 MG tablet Take 2 tablets (650 mg total) by mouth every 6 (six) hours as needed. 100 tablet 0    aminocaproic  acid (AMICAR ) 500 MG tablet Take 2 tablets (1,000 mg total) by mouth every 12 (twelve) hours. 120 tablet 3   amLODipine  (NORVASC ) 10 MG tablet Take 1 tablet (10 mg total) by mouth daily. 30 tablet 11   citalopram  (CELEXA ) 20 MG tablet TAKE 1 TABLET(20 MG) BY MOUTH DAILY (Patient taking differently: Take 20 mg by mouth daily as needed (for anxiety).) 90 tablet 3   gabapentin  (NEURONTIN ) 100 MG capsule Take 1 capsule (100 mg total) by mouth at bedtime. 90 capsule 3   glucose blood (ACCU-CHEK GUIDE) test strip Check blood sugar 2 times per day while on steroids before breakfast and dinner (Patient taking differently: 1 each by Other route See admin instructions. Check blood sugar 2 times per day while on steroids before breakfast and dinner) 50 each 3   leptospermum manuka honey (MEDIHONEY) PSTE paste Apply 1 Application topically daily. 15 mL 0   lidocaine  4 % Place 1 patch onto the skin daily. Remove & Discard patch within 12 hours or as directed by MD 30 patch 0   lisinopril  (ZESTRIL ) 20 MG tablet Take 1 tablet (20 mg total) by mouth daily. 30 tablet 3   pantoprazole  (PROTONIX ) 40 MG tablet TAKE 1 TABLET(40 MG) BY MOUTH TWICE DAILY (Patient taking differently: Take 40 mg by mouth 2 (two) times daily as needed (for acid reflux).) 180 tablet 1   potassium chloride  SA (KLOR-CON  M) 20 MEQ tablet Take 1 tablet (20 mEq total) by mouth daily. 15 tablet 0   PROAIR  HFA 108 (90 Base) MCG/ACT inhaler INHALE 1 TO 2 PUFFS INTO THE LUNGS EVERY 6 HOURS AS NEEDED FOR WHEEZING OR SHORTNESS OF BREATH (Patient not taking: Reported  on 12/24/2023) 8.5 g 0   senna-docusate (SENOKOT-S) 8.6-50 MG tablet Take 1 tablet by mouth at bedtime. (Patient not taking: Reported on 12/24/2023) 100 tablet 0   traZODone  (DESYREL ) 50 MG tablet TAKE 1 TABLET(50 MG) BY MOUTH AT BEDTIME AS NEEDED FOR SLEEP 30 tablet 0   No current facility-administered medications for this visit.    PHYSICAL EXAMINATION: ECOG PERFORMANCE STATUS:  2 - Symptomatic, <50% confined to bed  Vitals:   02/18/24 1322  BP: 120/70  Pulse: 74  Resp: 17  Temp: 97.8 F (36.6 C)  SpO2: 99%   Wt Readings from Last 3 Encounters:  02/18/24 227 lb (103 kg)  01/07/24 227 lb 1.6 oz (103 kg)  12/24/23 227 lb 4.8 oz (103.1 kg)     GENERAL:alert, no distress and comfortable SKIN: skin color, texture, turgor are normal, no rashes, skin hyperpigmentation in legs, right lower extremity and foot is wrapped with gauze EYES: normal, Conjunctiva are pink and non-injected, sclera clear Musculoskeletal:no cyanosis of digits and no clubbing  NEURO: alert & oriented x 3 with fluent speech, no focal motor/sensory deficits  Physical Exam    LABORATORY DATA:  I have reviewed the data as listed    Latest Ref Rng & Units 02/18/2024    1:00 PM 01/15/2024   10:37 AM 01/07/2024    9:55 AM  CBC  WBC 4.0 - 10.5 K/uL 8.8  8.8  12.5   Hemoglobin 12.0 - 15.0 g/dL 6.2  6.4  7.3   Hematocrit 36.0 - 46.0 % 21.7  21.4  24.4   Platelets 150 - 400 K/uL 290  354  449         Latest Ref Rng & Units 02/18/2024    1:00 PM 01/15/2024   10:37 AM 01/07/2024    9:55 AM  CMP  Glucose 70 - 99 mg/dL 92  888  893   BUN 8 - 23 mg/dL 7  11  8    Creatinine 0.44 - 1.00 mg/dL 9.12  9.18  9.24   Sodium 135 - 145 mmol/L 142  142  140   Potassium 3.5 - 5.1 mmol/L 3.7  3.8  3.8   Chloride 98 - 111 mmol/L 110  111  110   CO2 22 - 32 mmol/L 25  25  25    Calcium  8.9 - 10.3 mg/dL 9.1  9.3  8.7   Total Protein 6.5 - 8.1 g/dL 7.0  7.3  7.0   Total Bilirubin 0.0 - 1.2 mg/dL 0.3  0.2  0.3   Alkaline Phos 38 - 126 U/L 93  76  77   AST 15 - 41 U/L 10  10  10    ALT 0 - 44 U/L <5  5  <5       RADIOGRAPHIC STUDIES: I have personally reviewed the radiological images as listed and agreed with the findings in the report. No results found.    No orders of the defined types were placed in this encounter.  All questions were answered. The patient knows to call the clinic with any  problems, questions or concerns. No barriers to learning was detected. The total time spent in the appointment was 25 minutes, including review of chart and various tests results, discussions about plan of care and coordination of care plan     [name], MD 02/18/2024

## 2024-02-18 NOTE — Assessment & Plan Note (Signed)
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

## 2024-02-18 NOTE — Assessment & Plan Note (Signed)
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 -she has required multiple blood transfusions and APCs since 2019. 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 -She had multiple prolonged hospital stay due to severe GI bleeding.   -s/p IR embolization of gastric artery branch vessels on 12/10/17, cauterization of stomach for severe GI bleed in 11/2018, and epinephrine injection for bleeding ulcer on 11/03/20. -She has required frequent blood transfusion, IV iron and bevacizumab -She does respond to treatment, anemia improved with IV iron and bevacizumab, but she is not compliant with her appointments.

## 2024-02-18 NOTE — Telephone Encounter (Signed)
 Critical lab value reported:  Hgb 6.2 today w/BB Hold on File.  Notified Dr. Lanny and Team of critical lab.

## 2024-02-19 ENCOUNTER — Other Ambulatory Visit: Payer: Self-pay

## 2024-02-19 ENCOUNTER — Encounter (HOSPITAL_BASED_OUTPATIENT_CLINIC_OR_DEPARTMENT_OTHER): Admitting: General Surgery

## 2024-02-20 ENCOUNTER — Other Ambulatory Visit: Payer: Self-pay

## 2024-02-20 ENCOUNTER — Other Ambulatory Visit (HOSPITAL_COMMUNITY): Payer: Self-pay

## 2024-02-21 ENCOUNTER — Inpatient Hospital Stay

## 2024-02-21 VITALS — BP 174/73 | HR 70 | Temp 98.8°F | Resp 19

## 2024-02-21 DIAGNOSIS — D649 Anemia, unspecified: Secondary | ICD-10-CM

## 2024-02-21 DIAGNOSIS — D500 Iron deficiency anemia secondary to blood loss (chronic): Secondary | ICD-10-CM

## 2024-02-21 DIAGNOSIS — M25571 Pain in right ankle and joints of right foot: Secondary | ICD-10-CM

## 2024-02-21 MED ORDER — SODIUM CHLORIDE 0.9% IV SOLUTION
250.0000 mL | INTRAVENOUS | Status: DC
  Administered 2024-02-21: 100 mL via INTRAVENOUS

## 2024-02-21 MED ORDER — ACETAMINOPHEN 325 MG PO TABS
650.0000 mg | ORAL_TABLET | Freq: Once | ORAL | Status: AC
  Administered 2024-02-21: 650 mg via ORAL
  Filled 2024-02-21: qty 2

## 2024-02-21 NOTE — Patient Instructions (Signed)
 Blood Transfusion, Adult A blood transfusion is a procedure in which you receive blood or a type of blood cell (blood component) through an IV. You may need a blood transfusion when you have a low blood count, which is a low number of any blood cell. This may result from a bleeding disorder, illness, injury, or surgery. The blood may come from a donor, or you may be able to have your own blood collected and stored (autologous blood donation) before a planned surgery. The blood given in a transfusion may be made up of different blood components. You may receive: Red blood cells. These carry oxygen to the cells in the body. Platelets. These help your blood to clot. Plasma. This is the liquid part of your blood. It carries proteins and other substances throughout the body. White blood cells. These help you fight infections. If you have hemophilia or another clotting disorder, you may also receive other types of blood products. Depending on the type of blood product, this procedure may take 1-4 hours to complete. Tell a health care provider about: Any bleeding problems you have. Any previous reactions you have had during a blood transfusion. Any allergies you have. All medicines you are taking, including vitamins, herbs, eye drops, creams, and over-the-counter medicines. Any surgeries you have had. Any medical conditions you have. Whether you are pregnant or may be pregnant. What are the risks? Talk with your health care provider about risks. The most common problems include: A mild allergic reaction, such as red, swollen areas of skin (hives) and itching. Fever or chills. This may be the body's response to new blood cells received. This may occur during or up to 4 hours after the transfusion. More serious problems may include: A serious allergic reaction that causes difficulty breathing or swelling around the face and lips. Transfusion-associated circulatory overload (TACO), or too much fluid in  the lungs. This may cause breathing problems. Transfusion-related acute lung injury (TRALI), which causes breathing difficulty and low oxygen in the blood. This can occur within hours of the transfusion or several days later. Iron overload. This can happen after receiving many blood transfusions over a period of time. Infection or virus being transmitted. This is rare because donated blood is carefully tested before it is given. Hemolytic transfusion reaction. This is rare. It happens when the body's defense system (immune system)tries to attack the new blood cells. Symptoms may include fever, chills, nausea, low blood pressure, and low back or chest pain. Transfusion-associated graft-versus-host disease (TAGVHD). This is rare. It happens when donated cells attack the body's healthy tissues. What happens before the procedure? You will have a blood test to check your blood type. This test is done to know what kind of blood your body will accept and to match it to the donor blood. If you are going to have a planned surgery, you may be able to do an autologous blood donation. This may be done in case you need to have a transfusion. You will have your temperature, blood pressure, and pulse checked before the transfusion. If you have had an allergic reaction to a transfusion in the past, you may be given medicine to help prevent a reaction. This medicine may be given to you by mouth (orally) or through an IV. What happens during the procedure?  An IV will be inserted into one of your veins. The bag of blood will be attached to your IV. The blood will then enter through your vein. Your temperature, blood pressure,  and pulse will be monitored during the transfusion. This monitoring is done to detect early signs of a transfusion reaction. Tell your nurse right away if you have any of these symptoms during the transfusion: Shortness of breath or trouble breathing. Chest or back pain. Fever or  chills. Itching or hives. If you have any signs or symptoms of a reaction, your transfusion will be stopped and you may be given medicine. When the transfusion is complete, your IV will be removed. Pressure may be applied to the IV site for a few minutes. A bandage (dressing)will be applied. The procedure may vary among health care providers and hospitals. What happens after the procedure? Your temperature, blood pressure, pulse, breathing rate, and blood oxygen level will be monitored until you leave the hospital or clinic. Your blood may be tested to see how you have responded to the transfusion. You may be warmed with fluids or blankets to maintain a normal body temperature. If you receive your blood transfusion in an outpatient setting, you will be told whom to contact to report any reactions. Where to find more information Visit the [employer]: redcross.org Summary A blood transfusion is a procedure in which you receive blood or a type of blood cell (blood component) through an IV. The blood given in a transfusion may be made up of different blood components. You may receive red blood cells, platelets, plasma, or white blood cells depending on the condition treated. Your temperature, blood pressure, and pulse will be monitored before, during, and after the transfusion. After the transfusion, your blood may be tested to see how your body has responded. This information is not intended to replace advice given to you by your health care provider. Make sure you discuss any questions you have with your health care provider. Document Revised: 06/29/2021 Document Reviewed: 06/29/2021 Elsevier Patient Education  2024 [employer].

## 2024-02-23 LAB — TYPE AND SCREEN
ABO/RH(D): A NEG
Antibody Screen: NEGATIVE
Unit division: 0
Unit division: 0

## 2024-02-23 LAB — BPAM RBC
Blood Product Expiration Date: 202511262359
Blood Product Expiration Date: 202511262359
ISSUE DATE / TIME: 202511080801
ISSUE DATE / TIME: 202511080801
Unit Type and Rh: 600
Unit Type and Rh: 600

## 2024-02-26 ENCOUNTER — Inpatient Hospital Stay

## 2024-02-26 DIAGNOSIS — D649 Anemia, unspecified: Secondary | ICD-10-CM

## 2024-02-26 DIAGNOSIS — D500 Iron deficiency anemia secondary to blood loss (chronic): Secondary | ICD-10-CM

## 2024-02-26 DIAGNOSIS — I780 Hereditary hemorrhagic telangiectasia: Secondary | ICD-10-CM

## 2024-02-26 LAB — IRON AND IRON BINDING CAPACITY (CC-WL,HP ONLY)
Iron: 15 ug/dL — ABNORMAL LOW (ref 28–170)
Saturation Ratios: 4 % — ABNORMAL LOW (ref 10.4–31.8)
TIBC: 339 ug/dL (ref 250–450)
UIBC: 324 ug/dL (ref 148–442)

## 2024-02-26 LAB — FERRITIN: Ferritin: 18 ng/mL (ref 11–307)

## 2024-02-26 LAB — CBC WITH DIFFERENTIAL (CANCER CENTER ONLY)
Abs Immature Granulocytes: 0.03 K/uL (ref 0.00–0.07)
Basophils Absolute: 0 K/uL (ref 0.0–0.1)
Basophils Relative: 0 %
Eosinophils Absolute: 0.1 K/uL (ref 0.0–0.5)
Eosinophils Relative: 1 %
HCT: 26.3 % — ABNORMAL LOW (ref 36.0–46.0)
Hemoglobin: 7.9 g/dL — ABNORMAL LOW (ref 12.0–15.0)
Immature Granulocytes: 0 %
Lymphocytes Relative: 14 %
Lymphs Abs: 1.3 K/uL (ref 0.7–4.0)
MCH: 25.2 pg — ABNORMAL LOW (ref 26.0–34.0)
MCHC: 30 g/dL (ref 30.0–36.0)
MCV: 84 fL (ref 80.0–100.0)
Monocytes Absolute: 0.7 K/uL (ref 0.1–1.0)
Monocytes Relative: 7 %
Neutro Abs: 7.5 K/uL (ref 1.7–7.7)
Neutrophils Relative %: 78 %
Platelet Count: 309 K/uL (ref 150–400)
RBC: 3.13 MIL/uL — ABNORMAL LOW (ref 3.87–5.11)
RDW: 21.9 % — ABNORMAL HIGH (ref 11.5–15.5)
WBC Count: 9.6 K/uL (ref 4.0–10.5)
nRBC: 0 % (ref 0.0–0.2)

## 2024-02-26 LAB — SAMPLE TO BLOOD BANK

## 2024-02-27 ENCOUNTER — Encounter (HOSPITAL_BASED_OUTPATIENT_CLINIC_OR_DEPARTMENT_OTHER): Attending: General Surgery | Admitting: General Surgery

## 2024-02-27 DIAGNOSIS — Z720 Tobacco use: Secondary | ICD-10-CM | POA: Diagnosis not present

## 2024-02-27 DIAGNOSIS — T8131XS Disruption of external operation (surgical) wound, not elsewhere classified, sequela: Secondary | ICD-10-CM | POA: Diagnosis not present

## 2024-02-27 DIAGNOSIS — T8131XA Disruption of external operation (surgical) wound, not elsewhere classified, initial encounter: Secondary | ICD-10-CM | POA: Diagnosis not present

## 2024-02-27 DIAGNOSIS — E11622 Type 2 diabetes mellitus with other skin ulcer: Secondary | ICD-10-CM | POA: Insufficient documentation

## 2024-02-27 DIAGNOSIS — I780 Hereditary hemorrhagic telangiectasia: Secondary | ICD-10-CM | POA: Insufficient documentation

## 2024-02-27 DIAGNOSIS — L97812 Non-pressure chronic ulcer of other part of right lower leg with fat layer exposed: Secondary | ICD-10-CM | POA: Diagnosis not present

## 2024-02-28 ENCOUNTER — Inpatient Hospital Stay

## 2024-02-28 ENCOUNTER — Other Ambulatory Visit: Payer: Self-pay

## 2024-02-28 ENCOUNTER — Emergency Department (HOSPITAL_COMMUNITY)
Admission: EM | Admit: 2024-02-28 | Discharge: 2024-02-28 | Disposition: A | Discharge status: Home / Self Care | Attending: Emergency Medicine | Admitting: Emergency Medicine

## 2024-02-28 ENCOUNTER — Encounter (HOSPITAL_COMMUNITY): Payer: Self-pay

## 2024-02-28 DIAGNOSIS — R040 Epistaxis: Secondary | ICD-10-CM | POA: Diagnosis not present

## 2024-02-28 MED ORDER — TRANEXAMIC ACID FOR EPISTAXIS
500.0000 mg | Freq: Once | TOPICAL | Status: AC
Start: 2024-02-28 — End: 2024-02-28
  Administered 2024-02-28: 500 mg via TOPICAL
  Filled 2024-02-28 (×2): qty 10

## 2024-02-28 MED ORDER — LIDOCAINE-EPINEPHRINE 2 %-1:100000 IJ SOLN
10.0000 mL | Freq: Once | INTRAMUSCULAR | Status: AC
  Administered 2024-02-28: 10 mL via INTRADERMAL
  Filled 2024-02-28: qty 1

## 2024-02-28 MED ORDER — OXYMETAZOLINE HCL 0.05 % NA SOLN
1.0000 | Freq: Once | NASAL | Status: AC
  Administered 2024-02-28: 1 via NASAL
  Filled 2024-02-28: qty 30

## 2024-02-28 NOTE — Progress Notes (Signed)
 Pt came in today for 2 u PRBC. Upon arrival to cancer center pt having a nose bleed that she stated started at least 30 minutes prior to her arrival. Pt brought to infusion area and applied wash cloth to nose and held pressure. Pt unable to tolerate holding pressure to nose and kept removing cloth. Passed 3 large clots and soaked through at least 5 wash cloths. On call doctor, (Dr Autumn) notified and he advised, given her hx of bleeding disorder, to take pt to ED for further evaluation. Pt amenable to this and escorted via wheelchair to ED. Report given to Jakobi, RN at bedside.

## 2024-02-28 NOTE — ED Provider Notes (Signed)
 [place] EMERGENCY DEPARTMENT AT [facility] Provider Note   CSN: [id number] Arrival date & time: 02/28/24  9143     Patient presents with: Epistaxis   Peggy House is a 63 y.o. female here with left sided nose bleed.  Began spontaneously this morning.  Denies trauma or picking.  Hx of intermittent bleeds and bleeding problem.  Labs 11/13 with hgb 7.9 at baseline level with chronic nemia and platelets normal 309, stable and normal over past few months   HPI     Prior to Admission medications   Medication Sig Start Date End Date Taking? Authorizing Provider  Accu-Chek Softclix Lancets lancets Use to check blood sugar before breakfast and before dinner while on steroids Patient taking differently: 1 each by Other route See admin instructions. Use to check blood sugar before breakfast and before dinner while on steroids 09/27/19   Arminda Daved HERO, MD  acetaminophen  (TYLENOL ) 325 MG tablet Take 2 tablets (650 mg total) by mouth every 6 (six) hours as needed. 05/19/23   Raulkar, Sven SQUIBB, MD  aminocaproic  acid (AMICAR ) 500 MG tablet Take 2 tablets (1,000 mg total) by mouth every 12 (twelve) hours. 02/18/24   Lanny Callander, MD  amLODipine  (NORVASC ) 10 MG tablet Take 1 tablet (10 mg total) by mouth daily. 06/10/23 06/09/24  Gregary Sharper, MD  citalopram  (CELEXA ) 20 MG tablet TAKE 1 TABLET(20 MG) BY MOUTH DAILY Patient taking differently: Take 20 mg by mouth daily as needed (for anxiety). 08/27/23   Nooruddin, Saad, MD  gabapentin  (NEURONTIN ) 100 MG capsule Take 1 capsule (100 mg total) by mouth at bedtime. 07/04/23   Raulkar, Sven SQUIBB, MD  glucose blood (ACCU-CHEK GUIDE) test strip Check blood sugar 2 times per day while on steroids before breakfast and dinner Patient taking differently: 1 each by Other route See admin instructions. Check blood sugar 2 times per day while on steroids before breakfast and dinner 09/27/19   Arminda Daved HERO, MD  leptospermum manuka honey  (MEDIHONEY) PSTE paste Apply 1 Application topically daily. 12/28/23   Tobie Gaines, DO  lidocaine  4 % Place 1 patch onto the skin daily. Remove & Discard patch within 12 hours or as directed by MD 12/27/23   Tobie Gaines, DO  lisinopril  (ZESTRIL ) 20 MG tablet Take 1 tablet (20 mg total) by mouth daily. 06/23/23   Nooruddin, Saad, MD  pantoprazole  (PROTONIX ) 40 MG tablet TAKE 1 TABLET(40 MG) BY MOUTH TWICE DAILY Patient taking differently: Take 40 mg by mouth 2 (two) times daily as needed (for acid reflux). 09/30/22   Lanny Callander, MD  potassium chloride  SA (KLOR-CON  M) 20 MEQ tablet Take 1 tablet (20 mEq total) by mouth daily. 08/13/23   [name], DO  PROAIR  HFA 108 (90 Base) MCG/ACT inhaler INHALE 1 TO 2 PUFFS INTO THE LUNGS EVERY 6 HOURS AS NEEDED FOR WHEEZING OR SHORTNESS OF BREATH Patient not taking: Reported on 12/24/2023 11/10/20   Lanny Callander, MD  senna-docusate (SENOKOT-S) 8.6-50 MG tablet Take 1 tablet by mouth at bedtime. Patient not taking: Reported on 12/24/2023 05/19/23   Lorilee Sven SQUIBB, MD  traZODone  (DESYREL ) 50 MG tablet TAKE 1 TABLET(50 MG) BY MOUTH AT BEDTIME AS NEEDED FOR SLEEP 11/14/23   Nooruddin, Saad, MD    Allergies: Feraheme  [ferumoxytol ], Nsaids, Wasp venom, Tomato, and Iron  (ferrous sulfate ) [ferrous sulfate  er]    Review of Systems  Updated Vital Signs BP (!) 153/75 (BP Location: Left Arm)   Pulse 77   Temp 98.2 F (  36.8 C) (Oral)   Resp 18   SpO2 98%   Physical Exam Constitutional:      General: She is not in acute distress. HENT:     Head: Normocephalic and atraumatic.     Nose:     Comments: Left anterior nose bleed, trickle Eyes:     Conjunctiva/sclera: Conjunctivae normal.     Pupils: Pupils are equal, round, and reactive to light.  Cardiovascular:     Rate and Rhythm: Normal rate and regular rhythm.  Pulmonary:     Effort: Pulmonary effort is normal. No respiratory distress.  Abdominal:     General: There is no distension.     Tenderness: There is no  abdominal tenderness.  Skin:    General: Skin is warm and dry.  Neurological:     General: No focal deficit present.     Mental Status: She is alert. Mental status is at baseline.  Psychiatric:        Mood and Affect: Mood normal.        Behavior: Behavior normal.     (all labs ordered are listed, but only abnormal results are displayed) Labs Reviewed - No data to display  EKG: None  Radiology: No results found.   Epistaxis Management  Date/Time: 02/28/2024 10:22 AM  Performed by: Cottie Donnice PARAS, MD Authorized by: Cottie Donnice PARAS, MD   Consent:    Consent obtained:  Verbal   Consent given by:  Patient   Risks, benefits, and alternatives were discussed: yes     Risks discussed:  Bleeding, infection, pain and nasal injury   Alternatives discussed:  Observation Universal protocol:    Procedure explained and questions answered to patient or proxy's satisfaction: yes     Relevant documents present and verified: yes     Site/side marked: yes     Immediately prior to procedure, a time out was called: yes     Patient identity confirmed:  Arm band Anesthesia:    Anesthesia method:  Topical application   Topical anesthetic:  LET Procedure details:    Treatment site:  L anterior   Repair method: Lidocaine  w/ epi and TXA on pledglet.   Treatment complexity:  Limited   Treatment episode: initial   Post-procedure details:    Assessment:  No improvement   Procedure completion:  Tolerated well, no immediate complications    Medications Ordered in the ED  oxymetazoline  (AFRIN) 0.05 % nasal spray 1 spray (1 spray Each Nare Given 02/28/24 0924)  tranexamic acid  (CYKLOKAPRON ) 1000 MG/10ML topical solution 500 mg (500 mg Topical Given by Other 02/28/24 1012)  lidocaine -EPINEPHrine  (XYLOCAINE  W/EPI) 2 %-1:100000 (with pres) injection 10 mL (10 mLs Intradermal Given by Other 02/28/24 1013)    Clinical Course as of 02/28/24 1023  Sat Feb 28, 2024  0940 Afrin given, pledgelet  placed [MT]  1021 Bleeding hemostatic - patient again declined chemical cauterization of visible arteriole locus of bleed.  NO active bleeding.  Okay for discharge [MT]    Clinical Course User Index [MT] Shonia Skilling, Donnice PARAS, MD                                 Medical Decision Making Risk OTC drugs. Prescription drug management.   Anterior epistaxis Recent platelets checked this week normal Applying pressure to nose, will give afrin, txa and lido with epi pledgelet  No symptoms of anemia, vitals stable, no indication for blood  testing now  Advised vaseline at home BID to keep moist nose     Final diagnoses:  Epistaxis    ED Discharge Orders     None          Aidyn Sportsman, Donnice PARAS, MD 02/28/24 1023

## 2024-02-28 NOTE — ED Triage Notes (Signed)
 Pt from cancer center due to nose bleed. Pt scheduled for 2 units of blood, came in bleeding. Hgb 7.9. Pt saturated 6-7 washcloths wit blood. Hx of HHT bleeding disorder. Pt AAOx4.

## 2024-02-29 ENCOUNTER — Ambulatory Visit: Payer: Self-pay | Admitting: Hematology

## 2024-03-01 ENCOUNTER — Other Ambulatory Visit: Payer: Self-pay | Admitting: Orthopaedic Surgery

## 2024-03-01 DIAGNOSIS — T8130XA Disruption of wound, unspecified, initial encounter: Secondary | ICD-10-CM | POA: Diagnosis not present

## 2024-03-01 DIAGNOSIS — T84629A Infection and inflammatory reaction due to internal fixation device of unspecified bone of leg, initial encounter: Secondary | ICD-10-CM | POA: Diagnosis not present

## 2024-03-01 DIAGNOSIS — M869 Osteomyelitis, unspecified: Secondary | ICD-10-CM | POA: Diagnosis not present

## 2024-03-01 DIAGNOSIS — M86271 Subacute osteomyelitis, right ankle and foot: Secondary | ICD-10-CM | POA: Diagnosis not present

## 2024-03-03 ENCOUNTER — Other Ambulatory Visit: Payer: Self-pay

## 2024-03-04 ENCOUNTER — Inpatient Hospital Stay (HOSPITAL_COMMUNITY)
Admission: EM | Admit: 2024-03-04 | Discharge: 2024-03-07 | DRG: 378 | Disposition: A | Discharge status: Home / Self Care | Attending: Family Medicine | Admitting: Family Medicine

## 2024-03-04 ENCOUNTER — Other Ambulatory Visit: Payer: Self-pay

## 2024-03-04 ENCOUNTER — Inpatient Hospital Stay

## 2024-03-04 ENCOUNTER — Encounter (HOSPITAL_COMMUNITY): Payer: Self-pay | Admitting: *Deleted

## 2024-03-04 ENCOUNTER — Emergency Department (HOSPITAL_COMMUNITY)

## 2024-03-04 DIAGNOSIS — K259 Gastric ulcer, unspecified as acute or chronic, without hemorrhage or perforation: Secondary | ICD-10-CM

## 2024-03-04 DIAGNOSIS — I517 Cardiomegaly: Secondary | ICD-10-CM | POA: Diagnosis not present

## 2024-03-04 DIAGNOSIS — R040 Epistaxis: Secondary | ICD-10-CM | POA: Diagnosis present

## 2024-03-04 DIAGNOSIS — D509 Iron deficiency anemia, unspecified: Secondary | ICD-10-CM | POA: Diagnosis present

## 2024-03-04 DIAGNOSIS — Z6841 Body Mass Index (BMI) 40.0 and over, adult: Secondary | ICD-10-CM | POA: Diagnosis not present

## 2024-03-04 DIAGNOSIS — G4700 Insomnia, unspecified: Secondary | ICD-10-CM | POA: Diagnosis not present

## 2024-03-04 DIAGNOSIS — K5520 Angiodysplasia of colon without hemorrhage: Secondary | ICD-10-CM | POA: Diagnosis present

## 2024-03-04 DIAGNOSIS — R5381 Other malaise: Secondary | ICD-10-CM | POA: Diagnosis present

## 2024-03-04 DIAGNOSIS — K3189 Other diseases of stomach and duodenum: Secondary | ICD-10-CM | POA: Diagnosis present

## 2024-03-04 DIAGNOSIS — D62 Acute posthemorrhagic anemia: Principal | ICD-10-CM | POA: Diagnosis present

## 2024-03-04 DIAGNOSIS — M159 Polyosteoarthritis, unspecified: Secondary | ICD-10-CM | POA: Diagnosis present

## 2024-03-04 DIAGNOSIS — Z8419 Family history of other disorders of kidney and ureter: Secondary | ICD-10-CM | POA: Diagnosis not present

## 2024-03-04 DIAGNOSIS — R059 Cough, unspecified: Secondary | ICD-10-CM | POA: Diagnosis present

## 2024-03-04 DIAGNOSIS — E119 Type 2 diabetes mellitus without complications: Secondary | ICD-10-CM | POA: Diagnosis present

## 2024-03-04 DIAGNOSIS — E785 Hyperlipidemia, unspecified: Secondary | ICD-10-CM | POA: Diagnosis present

## 2024-03-04 DIAGNOSIS — Z860100 Personal history of colon polyps, unspecified: Secondary | ICD-10-CM

## 2024-03-04 DIAGNOSIS — D649 Anemia, unspecified: Secondary | ICD-10-CM | POA: Diagnosis not present

## 2024-03-04 DIAGNOSIS — Z8041 Family history of malignant neoplasm of ovary: Secondary | ICD-10-CM | POA: Diagnosis not present

## 2024-03-04 DIAGNOSIS — F411 Generalized anxiety disorder: Secondary | ICD-10-CM | POA: Diagnosis not present

## 2024-03-04 DIAGNOSIS — K31811 Angiodysplasia of stomach and duodenum with bleeding: Principal | ICD-10-CM | POA: Diagnosis present

## 2024-03-04 DIAGNOSIS — Z833 Family history of diabetes mellitus: Secondary | ICD-10-CM | POA: Diagnosis not present

## 2024-03-04 DIAGNOSIS — Z800 Family history of malignant neoplasm of digestive organs: Secondary | ICD-10-CM | POA: Diagnosis not present

## 2024-03-04 DIAGNOSIS — R5383 Other fatigue: Secondary | ICD-10-CM | POA: Diagnosis not present

## 2024-03-04 DIAGNOSIS — K921 Melena: Secondary | ICD-10-CM | POA: Diagnosis not present

## 2024-03-04 DIAGNOSIS — K31819 Angiodysplasia of stomach and duodenum without bleeding: Secondary | ICD-10-CM

## 2024-03-04 DIAGNOSIS — I10 Essential (primary) hypertension: Secondary | ICD-10-CM | POA: Diagnosis present

## 2024-03-04 DIAGNOSIS — R051 Acute cough: Secondary | ICD-10-CM | POA: Diagnosis not present

## 2024-03-04 DIAGNOSIS — E66813 Obesity, class 3: Secondary | ICD-10-CM | POA: Diagnosis not present

## 2024-03-04 DIAGNOSIS — F17210 Nicotine dependence, cigarettes, uncomplicated: Secondary | ICD-10-CM | POA: Diagnosis not present

## 2024-03-04 DIAGNOSIS — K922 Gastrointestinal hemorrhage, unspecified: Secondary | ICD-10-CM | POA: Diagnosis present

## 2024-03-04 DIAGNOSIS — R110 Nausea: Secondary | ICD-10-CM | POA: Diagnosis not present

## 2024-03-04 DIAGNOSIS — K254 Chronic or unspecified gastric ulcer with hemorrhage: Secondary | ICD-10-CM | POA: Diagnosis not present

## 2024-03-04 DIAGNOSIS — I780 Hereditary hemorrhagic telangiectasia: Secondary | ICD-10-CM | POA: Diagnosis not present

## 2024-03-04 DIAGNOSIS — F339 Major depressive disorder, recurrent, unspecified: Secondary | ICD-10-CM | POA: Diagnosis not present

## 2024-03-04 DIAGNOSIS — Z79899 Other long term (current) drug therapy: Secondary | ICD-10-CM

## 2024-03-04 DIAGNOSIS — I959 Hypotension, unspecified: Secondary | ICD-10-CM | POA: Diagnosis not present

## 2024-03-04 DIAGNOSIS — Z832 Family history of diseases of the blood and blood-forming organs and certain disorders involving the immune mechanism: Secondary | ICD-10-CM

## 2024-03-04 DIAGNOSIS — F17200 Nicotine dependence, unspecified, uncomplicated: Secondary | ICD-10-CM | POA: Diagnosis present

## 2024-03-04 DIAGNOSIS — F418 Other specified anxiety disorders: Secondary | ICD-10-CM | POA: Diagnosis not present

## 2024-03-04 DIAGNOSIS — Z90710 Acquired absence of both cervix and uterus: Secondary | ICD-10-CM

## 2024-03-04 DIAGNOSIS — K219 Gastro-esophageal reflux disease without esophagitis: Secondary | ICD-10-CM | POA: Diagnosis present

## 2024-03-04 LAB — CBC WITH DIFFERENTIAL/PLATELET
Abs Immature Granulocytes: 0.07 K/uL (ref 0.00–0.07)
Basophils Absolute: 0.1 K/uL (ref 0.0–0.1)
Basophils Relative: 0 %
Eosinophils Absolute: 0.1 K/uL (ref 0.0–0.5)
Eosinophils Relative: 0 %
HCT: 17.5 % — ABNORMAL LOW (ref 36.0–46.0)
Hemoglobin: 4.8 g/dL — CL (ref 12.0–15.0)
Immature Granulocytes: 1 %
Lymphocytes Relative: 7 %
Lymphs Abs: 0.9 K/uL (ref 0.7–4.0)
MCH: 24.2 pg — ABNORMAL LOW (ref 26.0–34.0)
MCHC: 27.4 g/dL — ABNORMAL LOW (ref 30.0–36.0)
MCV: 88.4 fL (ref 80.0–100.0)
Monocytes Absolute: 0.5 K/uL (ref 0.1–1.0)
Monocytes Relative: 4 %
Neutro Abs: 11.6 K/uL — ABNORMAL HIGH (ref 1.7–7.7)
Neutrophils Relative %: 88 %
Platelets: 311 K/uL (ref 150–400)
RBC: 1.98 MIL/uL — ABNORMAL LOW (ref 3.87–5.11)
RDW: 22.2 % — ABNORMAL HIGH (ref 11.5–15.5)
Smear Review: NORMAL
WBC: 13.2 K/uL — ABNORMAL HIGH (ref 4.0–10.5)
nRBC: 0 % (ref 0.0–0.2)

## 2024-03-04 LAB — BASIC METABOLIC PANEL WITH GFR
Anion gap: 13 (ref 5–15)
BUN: 18 mg/dL (ref 8–23)
CO2: 19 mmol/L — ABNORMAL LOW (ref 22–32)
Calcium: 8.5 mg/dL — ABNORMAL LOW (ref 8.9–10.3)
Chloride: 108 mmol/L (ref 98–111)
Creatinine, Ser: 0.9 mg/dL (ref 0.44–1.00)
GFR, Estimated: >60 mL/min (ref >60)
Glucose, Bld: 120 mg/dL — ABNORMAL HIGH (ref 70–99)
Potassium: 3.9 mmol/L (ref 3.5–5.1)
Sodium: 140 mmol/L (ref 135–145)

## 2024-03-04 LAB — POC OCCULT BLOOD, ED: Fecal Occult Bld: POSITIVE — AB

## 2024-03-04 LAB — PREPARE RBC (CROSSMATCH)

## 2024-03-04 MED ORDER — PANTOPRAZOLE SODIUM 40 MG IV SOLR
40.0000 mg | Freq: Once | INTRAVENOUS | Status: AC
  Administered 2024-03-04: 40 mg via INTRAVENOUS
  Filled 2024-03-04: qty 10

## 2024-03-04 MED ORDER — CITALOPRAM HYDROBROMIDE 20 MG PO TABS
20.0000 mg | ORAL_TABLET | Freq: Every day | ORAL | Status: DC
  Administered 2024-03-04 – 2024-03-07 (×4): 20 mg via ORAL
  Filled 2024-03-04: qty 1
  Filled 2024-03-04 (×2): qty 2
  Filled 2024-03-04: qty 1

## 2024-03-04 MED ORDER — SODIUM CHLORIDE 0.9% IV SOLUTION: Freq: Once | INTRAVENOUS | Status: AC

## 2024-03-04 MED ORDER — ACETAMINOPHEN 325 MG PO TABS: 650.0000 mg | ORAL_TABLET | Freq: Four times a day (QID) | ORAL | Status: DC | PRN

## 2024-03-04 MED ORDER — ACETAMINOPHEN 325 MG PO TABS
650.0000 mg | ORAL_TABLET | Freq: Four times a day (QID) | ORAL | Status: DC | PRN
  Administered 2024-03-04 – 2024-03-07 (×6): 650 mg via ORAL
  Filled 2024-03-04 (×6): qty 2

## 2024-03-04 MED ORDER — SODIUM CHLORIDE 0.9 % IV SOLN: INTRAVENOUS | Status: AC

## 2024-03-04 MED ORDER — ONDANSETRON HCL 4 MG PO TABS: 4.0000 mg | ORAL_TABLET | Freq: Four times a day (QID) | ORAL | Status: DC | PRN

## 2024-03-04 MED ORDER — PANTOPRAZOLE SODIUM 40 MG IV SOLR
40.0000 mg | Freq: Two times a day (BID) | INTRAVENOUS | Status: DC
  Administered 2024-03-05 – 2024-03-06 (×4): 40 mg via INTRAVENOUS
  Filled 2024-03-04 (×5): qty 10

## 2024-03-04 MED ORDER — TRAZODONE HCL 50 MG PO TABS
50.0000 mg | ORAL_TABLET | Freq: Every evening | ORAL | Status: DC | PRN
Start: 2024-03-04 — End: 2024-03-07
  Administered 2024-03-06: 50 mg via ORAL
  Filled 2024-03-04: qty 1

## 2024-03-04 MED ORDER — ONDANSETRON HCL 4 MG/2ML IJ SOLN: 4.0000 mg | Freq: Four times a day (QID) | INTRAMUSCULAR | Status: DC | PRN

## 2024-03-04 MED ORDER — ACETAMINOPHEN 650 MG RE SUPP: 650.0000 mg | Freq: Four times a day (QID) | RECTAL | Status: DC | PRN

## 2024-03-04 MED ORDER — DOCUSATE SODIUM 100 MG PO CAPS
100.0000 mg | ORAL_CAPSULE | Freq: Two times a day (BID) | ORAL | Status: DC
  Administered 2024-03-04 – 2024-03-07 (×6): 100 mg via ORAL
  Filled 2024-03-04 (×6): qty 1

## 2024-03-04 NOTE — ED Provider Triage Note (Signed)
 Emergency Medicine Provider Triage Evaluation Note  Peggy House , a 63 y.o. female  was evaluated in triage.  Pt complains of cough and right foot wound currently being treated by wound care.  Review of Systems  Positive: Cough, fatigue, nausea Negative: Shortness of breath, fever, dizziness, vomiting, chest pain  Physical Exam  BP (!) 125/55   Pulse 81   Temp 98.4 F (36.9 C) (Oral)   Resp 16   Wt 106.1 kg   SpO2 99%   BMI 42.80 kg/m  Gen:   Awake, no distress   Resp:  Normal effort  MSK:   Moves extremities without difficulty  Other:    Medical Decision Making  Medically screening exam initiated at 2:54 PM.  Appropriate orders placed.  Peggy House was informed that the remainder of the evaluation will be completed by another provider, this initial triage assessment does not replace that evaluation, and the importance of remaining in the ED until their evaluation is complete.  Labs and imaging ordered   Peggy Ileana SAILOR, PA-C 03/04/24 1455

## 2024-03-04 NOTE — ED Provider Notes (Signed)
 [place] [facility] AT [facility] Provider Note   CSN: [id number] Arrival date & time: 03/04/24  1404     Patient presents with: Cough   Peggy House is a 63 y.o. female.   63 year old female presents for evaluation of cough and fatigue.  States has been going on for a few days.  States she also noticed some black stool this started yesterday.  She states that she has a history of GI bleeds.  Denies any abdominal pain or any other symptoms or concerns at this time.   Cough Associated symptoms: no chest pain, no chills, no ear pain, no fever, no rash, no shortness of breath and no sore throat        Prior to Admission medications   Medication Sig Start Date End Date Taking? Authorizing Provider  Accu-Chek Softclix Lancets lancets Use to check blood sugar before breakfast and before dinner while on steroids Patient taking differently: 1 each by Other route See admin instructions. Use to check blood sugar before breakfast and before dinner while on steroids 09/27/19  Yes Arminda Daved HERO, MD  acetaminophen  (TYLENOL ) 325 MG tablet Take 2 tablets (650 mg total) by mouth every 6 (six) hours as needed. 05/19/23  Yes Raulkar, Sven SQUIBB, MD  ALPRAZolam  (XANAX ) 0.5 MG tablet Take 0.5 mg by mouth at bedtime.   Yes [provider]  aminocaproic  acid (AMICAR ) 500 MG tablet Take 2 tablets (1,000 mg total) by mouth every 12 (twelve) hours. 02/18/24  Yes Lanny Callander, MD  amLODipine  (NORVASC ) 10 MG tablet Take 1 tablet (10 mg total) by mouth daily. 06/10/23 06/09/24 Yes Gregary Sharper, MD  citalopram  (CELEXA ) 20 MG tablet TAKE 1 TABLET(20 MG) BY MOUTH DAILY Patient taking differently: Take 20 mg by mouth daily as needed (for anxiety). 08/27/23  Yes Nooruddin, Saad, MD  gabapentin  (NEURONTIN ) 100 MG capsule Take 1 capsule (100 mg total) by mouth at bedtime. 07/04/23  Yes Raulkar, Sven SQUIBB, MD  glucose blood (ACCU-CHEK GUIDE) test strip Check blood sugar 2 times  per day while on steroids before breakfast and dinner Patient taking differently: 1 each by Other route See admin instructions. Check blood sugar 2 times per day while on steroids before breakfast and dinner 09/27/19  Yes Arminda Daved HERO, MD  leptospermum manuka honey (MEDIHONEY) PSTE paste Apply 1 Application topically daily. 12/28/23  Yes Tobie Gaines, DO  lidocaine  4 % Place 1 patch onto the skin daily. Remove & Discard patch within 12 hours or as directed by MD 12/27/23  Yes Tobie Gaines, DO  lisinopril  (ZESTRIL ) 20 MG tablet Take 1 tablet (20 mg total) by mouth daily. 06/23/23  Yes Nooruddin, Saad, MD  pantoprazole  (PROTONIX ) 40 MG tablet TAKE 1 TABLET(40 MG) BY MOUTH TWICE DAILY Patient taking differently: Take 40 mg by mouth 2 (two) times daily as needed (for acid reflux). 09/30/22  Yes Lanny Callander, MD  senna-docusate (SENOKOT-S) 8.6-50 MG tablet Take 1 tablet by mouth at bedtime. 05/19/23  Yes Raulkar, Sven SQUIBB, MD  traMADol  (ULTRAM ) 50 MG tablet Take 50 mg by mouth every 6 (six) hours as needed. for pain   Yes [provider]  traZODone  (DESYREL ) 50 MG tablet TAKE 1 TABLET(50 MG) BY MOUTH AT BEDTIME AS NEEDED FOR SLEEP 11/14/23  Yes Nooruddin, Saad, MD  potassium chloride  SA (KLOR-CON  M) 20 MEQ tablet Take 1 tablet (20 mEq total) by mouth daily. Patient not taking: Reported on 03/04/2024 08/13/23   [name], DO  PROAIR  HFA  108 (90 Base) MCG/ACT inhaler INHALE 1 TO 2 PUFFS INTO THE LUNGS EVERY 6 HOURS AS NEEDED FOR WHEEZING OR SHORTNESS OF BREATH Patient not taking: Reported on 12/24/2023 11/10/20   Lanny Callander, MD    Allergies: Feraheme  [ferumoxytol ], Nsaids, Wasp venom, Tomato, and Iron  (ferrous sulfate ) [ferrous sulfate  er]    Review of Systems  Constitutional:  Positive for fatigue. Negative for chills and fever.  HENT:  Negative for ear pain and sore throat.   Eyes:  Negative for pain and visual disturbance.  Respiratory:  Positive for cough. Negative for shortness of breath.    Cardiovascular:  Negative for chest pain and palpitations.  Gastrointestinal:  Negative for abdominal pain and vomiting.  Genitourinary:  Negative for dysuria and hematuria.  Musculoskeletal:  Negative for arthralgias and back pain.  Skin:  Negative for color change and rash.  Neurological:  Negative for seizures and syncope.  All other systems reviewed and are negative.   Updated Vital Signs BP (!) 120/50   Pulse 72   Temp 98.2 F (36.8 C) (Oral)   Resp 20   Wt 106.1 kg   SpO2 98%   BMI 42.80 kg/m   Physical Exam Vitals and nursing note reviewed.  Constitutional:      General: She is not in acute distress.    Appearance: Normal appearance. She is well-developed. She is not ill-appearing.  HENT:     Head: Normocephalic and atraumatic.  Eyes:     Conjunctiva/sclera: Conjunctivae normal.  Cardiovascular:     Rate and Rhythm: Normal rate and regular rhythm.     Heart sounds: No murmur heard. Pulmonary:     Effort: Pulmonary effort is normal. No respiratory distress.     Breath sounds: Normal breath sounds.  Abdominal:     Palpations: Abdomen is soft.     Tenderness: There is no abdominal tenderness.  Musculoskeletal:        General: No swelling.     Cervical back: Neck supple.  Skin:    General: Skin is warm and dry.     Capillary Refill: Capillary refill takes less than 2 seconds.  Neurological:     Mental Status: She is alert.  Psychiatric:        Mood and Affect: Mood normal.     (all labs ordered are listed, but only abnormal results are displayed) Labs Reviewed  BASIC METABOLIC PANEL WITH GFR - Abnormal; Notable for the following components:      Result Value   CO2 19 (*)    Glucose, Bld 120 (*)    Calcium  8.5 (*)    All other components within normal limits  CBC WITH DIFFERENTIAL/PLATELET - Abnormal; Notable for the following components:   WBC 13.2 (*)    RBC 1.98 (*)    Hemoglobin 4.8 (*)    HCT 17.5 (*)    MCH 24.2 (*)    MCHC 27.4 (*)    RDW  22.2 (*)    Neutro Abs 11.6 (*)    All other components within normal limits  POC OCCULT BLOOD, ED - Abnormal; Notable for the following components:   Fecal Occult Bld POSITIVE (*)    All other components within normal limits  RESP PANEL BY RT-PCR (RSV, FLU A&B, COVID)  [id number]  COMPREHENSIVE METABOLIC PANEL WITH GFR  CBC  MAGNESIUM   PHOSPHORUS  HEMOGLOBIN AND HEMATOCRIT, BLOOD  TYPE AND SCREEN  PREPARE RBC (CROSSMATCH)    EKG: None  Radiology: DG Chest 2 View Result Date:  03/04/2024 CLINICAL DATA:  Porta cough and fatigue. EXAM: CHEST - 2 VIEW COMPARISON:  Chest radiograph dated 04/18/2023. FINDINGS: Right-sided Port-A-Cath with tip at the cavoatrial junction. No focal consolidation, pleural effusion or pneumothorax. Borderline cardiomegaly. No acute osseous pathology. IMPRESSION: No active cardiopulmonary disease. Electronically Signed   By: Vanetta Chou M.D.   On: 03/04/2024 16:13     .Critical Care  Performed by: Gennaro Duwaine CROME, DO Authorized by: Gennaro Duwaine CROME, DO   Critical care provider statement:    Critical care time (minutes):  30   Critical care time was exclusive of:  Separately billable procedures and treating other patients and teaching time   Critical care was necessary to treat or prevent imminent or life-threatening deterioration of the following conditions:  Circulatory failure   Critical care was time spent personally by me on the following activities:  Development of treatment plan with patient or surrogate, discussions with consultants, evaluation of patient's response to treatment, examination of patient, ordering and review of laboratory studies, ordering and review of radiographic studies, ordering and performing treatments and interventions, pulse oximetry, re-evaluation of patient's condition and review of old charts   Care discussed with: admitting provider      Medications Ordered in the ED  acetaminophen  (TYLENOL ) tablet 650 mg (650 mg Oral  Given 03/04/24 2102)  citalopram  (CELEXA ) tablet 20 mg (20 mg Oral Given 03/04/24 2102)  traZODone  (DESYREL ) tablet 50 mg (has no administration in time range)  0.9 %  sodium chloride  infusion ( Intravenous New Bag/Given 03/04/24 2104)  docusate sodium  (COLACE) capsule 100 mg (100 mg Oral Given 03/04/24 2114)  ondansetron  (ZOFRAN ) tablet 4 mg (has no administration in time range)    Or  ondansetron  (ZOFRAN ) injection 4 mg (has no administration in time range)  pantoprazole  (PROTONIX ) injection 40 mg (40 mg Intravenous Not Given 03/04/24 2114)  0.9 %  sodium chloride  infusion (Manually program via Guardrails IV Fluids) ( Intravenous New Bag/Given 03/04/24 1829)  pantoprazole  (PROTONIX ) injection 40 mg (40 mg Intravenous Given 03/04/24 1837)                                    Medical Decision Making Cardiac monitor interpretation: Sinus rhythm, no ectopy  Patient here for anemia.  Was found to have a hemoglobin of 4.8.  Will transfuse her 2 units.  She is having an upper GI bleed as she has black stool that is Hemoccult positive.  She states this has happened before.  Discussed patient's case with on-call provider for [facility] and they will plan to see her tomorrow.  Discussed patient's case with Dr. Leotis, hospitalist and patient will be admitted for further workup and management.  Patient is agreeable with the plan.  Problems Addressed: Acute blood loss anemia: acute illness or injury that poses a threat to life or bodily functions Acute cough: undiagnosed new problem with uncertain prognosis Upper GI bleed: acute illness or injury that poses a threat to life or bodily functions  Amount and/or Complexity of Data Reviewed External Data Reviewed: notes.    Details: Prior ED records reviewed and patient was seen over 6 months ago for GI bleed and was hospitalized at that time Labs: ordered. Decision-making details documented in ED Course.    Details: Ordered and reviewed by me and patient  with a hemoglobin of 4.8 Radiology: ordered and independent interpretation performed. Decision-making details documented in ED Course.  Details: Ordered and inter by me independently radiology Chest x-ray: Shows no acute abnormality ECG/medicine tests: ordered and independent interpretation performed. Decision-making details documented in ED Course.    Details: Ordered and interpreted by me in the absence of cardiology and shows sinus rhythm, no STEMI or significant change when compared to prior Discussion of management or test interpretation with external provider(s): [facility] -I spoke with the on-call provider who states they will see the patient tomorrow  Dr. Leotis -hospitalist-I spoke with him on the phone regarding patient's case and he will admit the patient for further workup and management.  Risk OTC drugs. Prescription drug management. Drug therapy requiring intensive monitoring for toxicity. Decision regarding hospitalization. Risk Details: CRITICAL CARE Performed by: Duwaine LITTIE Fusi   Total critical care time: 30 minutes  Critical care time was exclusive of separately billable procedures and treating other patients.  Critical care was necessary to treat or prevent imminent or life-threatening deterioration.  Critical care was time spent personally by me on the following activities: development of treatment plan with patient and/or surrogate as well as nursing, discussions with consultants, evaluation of patient's response to treatment, examination of patient, obtaining history from patient or surrogate, ordering and performing treatments and interventions, ordering and review of laboratory studies, ordering and review of radiographic studies, pulse oximetry and re-evaluation of patient's condition.   Critical Care Total time providing critical care: 30 minutes     Final diagnoses:  Acute blood loss anemia  Upper GI bleed  Acute cough    ED Discharge Orders      None          Fusi Duwaine LITTIE, DO 03/04/24 2152

## 2024-03-04 NOTE — H&P (Signed)
 History and Physical    [name]:[id number] DOB: Mar 10, 1961 DOA: 03/04/2024  PCP: Nooruddin, Saad, MD   Patient coming from: Home  I have personally briefly reviewed patient's old medical records in [facility]  Chief Complaint: Cough, fatigue, malaise, tiredness for few days.  HPI: Peggy House is a 63 y.o. female with PMH significant for essential hypertension, obesity, major depressive disorder, type 2 diabetes, anxiety disorder, GERD, presented in the ED with complaints of fatigue, malaise and dry cough for few days. She reports sore throat which has improved.  Today morning she has large black-colored stools associated with mild dizziness.  She denies any syncopal episode.  She also reports having some mid abdominal discomfort since she has black-colored stools.  She denies any recent travel, sick contacts.  ED Course: She is hemodynamically stable. Temp 98.4, HR 81, RR 16, BP 125/55, SpO2 100% on room air. Labs include sodium 140, potassium 3.9, chloride 108, bicarb 19, glucose 120, BUN 18, creatinine 0.90, calcium  8.5, anion gap 13, iron  15, TIBC 339, ferritin 18, WBC 13.2, hemoglobin 4.8, hematocrit 17.5, MCV 88.4, platelets 311, stool for occult blood+, chest x-ray no acute abnormality.  Review of Systems:   Review of Systems  Constitutional:  Positive for malaise/fatigue.  HENT: Negative.    Eyes: Negative.   Respiratory:  Positive for cough.   Cardiovascular: Negative.   Gastrointestinal:  Positive for abdominal pain, blood in stool, heartburn and melena.  Genitourinary: Negative.   Musculoskeletal: Negative.   Skin: Negative.   Neurological:  Positive for dizziness.  Endo/Heme/Allergies: Negative.   Psychiatric/Behavioral:  Positive for depression. The patient is nervous/anxious and has insomnia.     Past Medical History:  Diagnosis Date   Anxiety    Arthritis    knnes,back   GERD (gastroesophageal reflux disease)    Heart murmur,  systolic 07/27/2023   Hereditary hemorrhagic telangiectasia    History of swelling of feet    Hyperlipidemia    Hypertension    Major depressive disorder, recurrent episode 06/05/2015   Obesity    Snores    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    Past Surgical History:  Procedure Laterality Date   ABDOMINAL HYSTERECTOMY     APPLICATION OF WOUND VAC Right 07/28/2023   Procedure: APPLICATION, WOUND VAC;  Surgeon: Lowery Estefana RAMAN, DO;  [facility];  Service: Plastics;  Laterality: Right;   APPLICATION, SKIN SUBSTITUTE Right 07/28/2023   Procedure: APPLICATION, SKIN SUBSTITUTE;  Surgeon: Lowery Estefana RAMAN, DO;  [facility]: [facility];  Service: Plastics;  Laterality: Right;   CARPAL TUNNEL RELEASE  05/13/2011   Procedure: CARPAL TUNNEL RELEASE;  Surgeon: Eva Elsie Herring, MD;  Location: [facility];  Service: Orthopedics;  Laterality: Left;   COLONOSCOPY N/A 03/02/2020   Procedure: COLONOSCOPY;  Surgeon: Donnald Charleston, MD;  Location: [facility]COPY;  Service: Endoscopy;  Laterality: N/A;   COLONOSCOPY WITH PROPOFOL  N/A 04/28/2014   Procedure: COLONOSCOPY WITH PROPOFOL ;  Surgeon: Charleston Donnald GAILS, MD;  Location: [facility] ENDOSCOPY;  Service: Endoscopy;  Laterality: N/A;   DG TOES*L*  2/10   rt   DILATION AND CURETTAGE OF UTERUS     ENTEROSCOPY N/A 10/17/2017   Procedure: ENTEROSCOPY;  Surgeon: Elicia Claw, MD;  [facility]: [facility];  Service: Gastroenterology;  Laterality: N/A;   ESOPHAGOGASTRODUODENOSCOPY N/A 04/10/2014   Procedure: ESOPHAGOGASTRODUODENOSCOPY (EGD);  Surgeon: Jerrell KYM Sol, MD;  Location: [facility] ENDOSCOPY;  Service: Endoscopy;  Laterality: N/A;   ESOPHAGOGASTRODUODENOSCOPY  N/A 05/10/2017   Procedure: ESOPHAGOGASTRODUODENOSCOPY (EGD);  Surgeon: Donnald Charleston, MD;  Location: [facility] ENDOSCOPY;  Service: Endoscopy;  Laterality: N/A;   ESOPHAGOGASTRODUODENOSCOPY N/A 09/22/2017   Procedure: ESOPHAGOGASTRODUODENOSCOPY (EGD);   Surgeon: Rosalie Kitchens, MD;  Location: [facility] ENDOSCOPY;  Service: Endoscopy;  Laterality: N/A;  bedside   ESOPHAGOGASTRODUODENOSCOPY N/A 03/02/2020   Procedure: ESOPHAGOGASTRODUODENOSCOPY (EGD);  Surgeon: Donnald Charleston, MD;  Location: [facility];  Service: Endoscopy;  Laterality: N/A;   ESOPHAGOGASTRODUODENOSCOPY N/A 11/03/2020   Procedure: ESOPHAGOGASTRODUODENOSCOPY (EGD);  Surgeon: Dianna Specking, MD;  Location: [facility] ENDOSCOPY;  Service: Endoscopy;  Laterality: N/A;   ESOPHAGOGASTRODUODENOSCOPY N/A 04/12/2022   Procedure: ESOPHAGOGASTRODUODENOSCOPY (EGD);  Surgeon: Rosalie Kitchens, MD;  Location: [facility]COPY;  Service: Gastroenterology;  Laterality: N/A;   ESOPHAGOGASTRODUODENOSCOPY N/A 05/27/2022   Procedure: ESOPHAGOGASTRODUODENOSCOPY (EGD);  Surgeon: Burnette Fallow, MD;  Location: [facility];  Service: Gastroenterology;  Laterality: N/A;   ESOPHAGOGASTRODUODENOSCOPY N/A 09/27/2022   Procedure: ESOPHAGOGASTRODUODENOSCOPY (EGD);  Surgeon: Kriss Estefana DEL, DO;  Location: [facility] ENDOSCOPY;  Service: Gastroenterology;  Laterality: N/A;   ESOPHAGOGASTRODUODENOSCOPY (EGD) WITH PROPOFOL  N/A 04/27/2014   Procedure: ESOPHAGOGASTRODUODENOSCOPY (EGD) WITH PROPOFOL ;  Surgeon: Charleston Donnald GAILS, MD;  Location: [facility] ENDOSCOPY;  Service: Endoscopy;  Laterality: N/A;  possible apc   ESOPHAGOGASTRODUODENOSCOPY (EGD) WITH PROPOFOL  N/A 09/30/2017   Procedure: ESOPHAGOGASTRODUODENOSCOPY (EGD) WITH PROPOFOL ;  Surgeon: Saintclair Jasper, MD;  Location: [facility] ENDOSCOPY;  Service: Gastroenterology;  Laterality: N/A;   ESOPHAGOGASTRODUODENOSCOPY (EGD) WITH PROPOFOL  N/A 10/01/2017   Procedure: ESOPHAGOGASTRODUODENOSCOPY (EGD) WITH PROPOFOL ;  Surgeon: Saintclair Jasper, MD;  Location: [facility] ENDOSCOPY;  Service: Gastroenterology;  Laterality: N/A;   ESOPHAGOGASTRODUODENOSCOPY (EGD) WITH PROPOFOL  N/A 10/08/2017   Procedure: ESOPHAGOGASTRODUODENOSCOPY (EGD) WITH PROPOFOL ;  Surgeon: Elicia Claw, MD;  [facility];  Service:  Gastroenterology;  Laterality: N/A;   ESOPHAGOGASTRODUODENOSCOPY (EGD) WITH PROPOFOL  N/A 10/17/2017   Procedure: ESOPHAGOGASTRODUODENOSCOPY (EGD) WITH PROPOFOL ;  Surgeon: Elicia Claw, MD;  [facility];  Service: Gastroenterology;  Laterality: N/A;   ESOPHAGOGASTRODUODENOSCOPY (EGD) WITH PROPOFOL  N/A 10/19/2017   Procedure: ESOPHAGOGASTRODUODENOSCOPY (EGD) WITH PROPOFOL ;  Surgeon: Elicia Claw, MD;  [facility];  Service: Gastroenterology;  Laterality: N/A;   ESOPHAGOGASTRODUODENOSCOPY (EGD) WITH PROPOFOL  N/A 12/04/2018   Procedure: ESOPHAGOGASTRODUODENOSCOPY (EGD) WITH PROPOFOL ;  Surgeon: Dianna Specking, MD;  Location: [facility] ENDOSCOPY;  Service: Endoscopy;  Laterality: N/A;   GIVENS CAPSULE STUDY N/A 10/02/2017   Procedure: GIVENS CAPSULE STUDY;  Surgeon: Saintclair Jasper, MD;  Location[facility]OSCOPY;  Service: Gastroenterology;  Laterality: N/A;   GIVENS CAPSULE STUDY N/A 10/08/2017   Procedure: GIVENS CAPSULE STUDY;  Surgeon: Elicia Claw, MD;  Location[facility];  Service: Gastroenterology;  Laterality: N/A;  endoscopic placement of capsule   GIVENS CAPSULE STUDY N/A 03/02/2020   Procedure: GIVENS CAPSULE STUDY;  Surgeon: Donnald Charleston, MD;  Location: [facility];  Service: Endoscopy;  Laterality: N/A;   HARDWARE REMOVAL Right 07/15/2023   Procedure: REMOVAL, HARDWARE;  Surgeon: Sherida Adine BROCKS, MD;  [facility];  Service: Orthopedics;  Laterality: Right;   HEMOSTASIS CLIP PLACEMENT  12/04/2018   Procedure: HEMOSTASIS CLIP PLACEMENT;  Surgeon: Dianna Specking, MD;  Location: [facility];  Service: Endoscopy;;   HEMOSTASIS CLIP PLACEMENT  05/27/2022   Procedure: HEMOSTASIS CLIP PLACEMENT;  Surgeon: Burnette Fallow, MD;  Location: [facility];  Service: Gastroenterology;;   HEMOSTASIS CLIP PLACEMENT  09/27/2022   Procedure: HEMOSTASIS CLIP PLACEMENT;  Surgeon: Kriss Estefana DEL, DO;  Location: [facility] ENDOSCOPY;  Service: Gastroenterology;;   HEMOSTASIS CONTROL   05/27/2022   Procedure: HEMOSTASIS CONTROL;  Surgeon: Burnette Fallow, MD;  Location: [facility];  Service: Gastroenterology;;   HOT HEMOSTASIS N/A 04/27/2014   Procedure: HOT HEMOSTASIS (ARGON PLASMA COAGULATION/BICAP);  Surgeon: Lamar Donnald GAILS, MD;  Location: [facility] ENDOSCOPY;  Service: Endoscopy;  Laterality: N/A;   HOT HEMOSTASIS N/A 09/30/2017   Procedure: HOT HEMOSTASIS (ARGON PLASMA COAGULATION/BICAP);  Surgeon: Saintclair Jasper, MD;  Location: [facility] ENDOSCOPY;  Service: Gastroenterology;  Laterality: N/A;   HOT HEMOSTASIS N/A 10/01/2017   Procedure: HOT HEMOSTASIS (ARGON PLASMA COAGULATION/BICAP);  Surgeon: Saintclair Jasper, MD;  Location: [facility] ENDOSCOPY;  Service: Gastroenterology;  Laterality: N/A;   HOT HEMOSTASIS N/A 10/17/2017   Procedure: HOT HEMOSTASIS (ARGON PLASMA COAGULATION/BICAP);  Surgeon: Elicia Claw, MD;  Location: [facility] ENDOSCOPY;  Service: Gastroenterology;  Laterality: N/A;   HOT HEMOSTASIS N/A 10/19/2017   Procedure: HOT HEMOSTASIS (ARGON PLASMA COAGULATION/BICAP);  Surgeon: Elicia Claw, MD;  Location: [facility] ENDOSCOPY;  Service: Gastroenterology;  Laterality: N/A;   HOT HEMOSTASIS N/A 03/02/2020   Procedure: HOT HEMOSTASIS (ARGON PLASMA COAGULATION/BICAP);  Surgeon: Donnald Lamar, MD;  Location: [facility];  Service: Endoscopy;  Laterality: N/A;   HOT HEMOSTASIS N/A 04/12/2022   Procedure: HOT HEMOSTASIS (ARGON PLASMA COAGULATION/BICAP);  Surgeon: Rosalie Kitchens, MD;  Location: [facility];  Service: Gastroenterology;  Laterality: N/A;   INCISION AND DRAINAGE OF WOUND Right 07/21/2023   Procedure: IRRIGATION AND DEBRIDEMENT WOUND;  Surgeon: Sherida Adine BROCKS, MD;  Location: [facility];  Service: Orthopedics;  Laterality: Right;  RIGHT ANKLE WOUND I&D   INCISION AND DRAINAGE OF WOUND Right 07/28/2023   Procedure: IRRIGATION AND DEBRIDEMENT WOUND;  Surgeon: Lowery Estefana RAMAN, DO;  Location: [facility];  Service: Plastics;  Laterality: Right;  rrigation and debridement of right ankle wound with  placement of myriad and wound vac   IR IMAGING GUIDED PORT INSERTION  07/08/2018   IRRIGATION AND DEBRIDEMENT POSTERIOR HIP Right 07/15/2023   Procedure: IRRIGATION AND DEBRIDEMENT, OPEN FRACTURE;  Surgeon: Sherida Adine BROCKS, MD;  Location: [facility];  Service: Orthopedics;  Laterality: Right;   L shoulder Surgery  2011   ORIF ANKLE FRACTURE Right 04/24/2023   Procedure: OPEN REDUCTION INTERNAL FIXATION (ORIF) ANKLE FRACTURE;  Surgeon: Sherida Adine BROCKS, MD;  Location[facility];  Service: Orthopedics;  Laterality: Right;   POLYPECTOMY  03/02/2020   Procedure: POLYPECTOMY;  Surgeon: Donnald Lamar, MD;  Location: [facility];  Service: Endoscopy;;   SCLEROTHERAPY  11/03/2020   Procedure: MATIAS;  Surgeon: Dianna Specking, MD;  Location: [facility] ENDOSCOPY;  Service: Endoscopy;;   SUBMUCOSAL INJECTION  09/22/2017   Procedure: SUBMUCOSAL INJECTION;  Surgeon: Rosalie Kitchens, MD;  Location: [facility] ENDOSCOPY;  Service: Endoscopy;;   SUBMUCOSAL INJECTION  12/04/2018   Procedure: SUBMUCOSAL INJECTION;  Surgeon: Dianna Specking, MD;  Location: [facility] END[facility];  Service: Endoscopy;;     reports that she has been smoking cigarettes. She has a 15 pack-year smoking history. She quit smokeless tobacco use about 44 years ago.  Her smokeless tobacco use included snuff. She reports that she does not drink alcohol and does not use drugs.  Allergies  Allergen Reactions   Feraheme  [Ferumoxytol ] Other (See Comments)    Muscle tetany, encephalopathy, fatigue, nausea (reaction to injection)   Nsaids Other (See Comments)    Stomach bleeding episodes; Tylenol  is OK   Wasp Venom Anaphylaxis   Tomato Hives   Iron  (Ferrous Sulfate ) [Ferrous Sulfate  Er] Other (See Comments)    Shock    Family History  Problem Relation Age of Onset   Cancer Mother        Ovarian   Ovarian  cancer Mother    Diabetes Mother    Kidney disease Mother    Bleeding Disorder Mother    Diabetes type II Sister    Bleeding Disorder Sister    Diabetes  Sister    Colon cancer Maternal Grandfather    Crohn's disease Maternal Grandfather    Stomach cancer Maternal Grandmother    Kidney disease Son    Bleeding Disorder Son    Dysmenorrhea Neg Hx     Family history reviewed and not pertinent.  Prior to Admission medications   Medication Sig Start Date End Date Taking? Authorizing Provider  Accu-Chek Softclix Lancets lancets Use to check blood sugar before breakfast and before dinner while on steroids Patient taking differently: 1 each by Other route See admin instructions. Use to check blood sugar before breakfast and before dinner while on steroids 09/27/19   Arminda Daved HERO, MD  acetaminophen  (TYLENOL ) 325 MG tablet Take 2 tablets (650 mg total) by mouth every 6 (six) hours as needed. 05/19/23   Raulkar, Sven SQUIBB, MD  aminocaproic  acid (AMICAR ) 500 MG tablet Take 2 tablets (1,000 mg total) by mouth every 12 (twelve) hours. 02/18/24   Lanny Callander, MD  amLODipine  (NORVASC ) 10 MG tablet Take 1 tablet (10 mg total) by mouth daily. 06/10/23 06/09/24  Gregary Sharper, MD  citalopram  (CELEXA ) 20 MG tablet TAKE 1 TABLET(20 MG) BY MOUTH DAILY Patient taking differently: Take 20 mg by mouth daily as needed (for anxiety). 08/27/23   Nooruddin, Saad, MD  gabapentin  (NEURONTIN ) 100 MG capsule Take 1 capsule (100 mg total) by mouth at bedtime. 07/04/23   Raulkar, Sven SQUIBB, MD  glucose blood (ACCU-CHEK GUIDE) test strip Check blood sugar 2 times per day while on steroids before breakfast and dinner Patient taking differently: 1 each by Other route See admin instructions. Check blood sugar 2 times per day while on steroids before breakfast and dinner 09/27/19   Arminda Daved HERO, MD  leptospermum manuka honey (MEDIHONEY) PSTE paste Apply 1 Application topically daily. 12/28/23   Tobie Gaines, DO  lidocaine  4 % Place 1 patch onto the skin daily. Remove & Discard patch within 12 hours or as directed by MD 12/27/23   Tobie Gaines, DO  lisinopril  (ZESTRIL ) 20 MG tablet  Take 1 tablet (20 mg total) by mouth daily. 06/23/23   Nooruddin, Saad, MD  pantoprazole  (PROTONIX ) 40 MG tablet TAKE 1 TABLET(40 MG) BY MOUTH TWICE DAILY Patient taking differently: Take 40 mg by mouth 2 (two) times daily as needed (for acid reflux). 09/30/22   Lanny Callander, MD  potassium chloride  SA (KLOR-CON  M) 20 MEQ tablet Take 1 tablet (20 mEq total) by mouth daily. 08/13/23   [name], DO  PROAIR  HFA 108 (90 Base) MCG/ACT inhaler INHALE 1 TO 2 PUFFS INTO THE LUNGS EVERY 6 HOURS AS NEEDED FOR WHEEZING OR SHORTNESS OF BREATH Patient not taking: Reported on 12/24/2023 11/10/20   Lanny Callander, MD  senna-docusate (SENOKOT-S) 8.6-50 MG tablet Take 1 tablet by mouth at bedtime. Patient not taking: Reported on 12/24/2023 05/19/23   Lorilee Sven SQUIBB, MD  traZODone  (DESYREL ) 50 MG tablet TAKE 1 TABLET(50 MG) BY MOUTH AT BEDTIME AS NEEDED FOR SLEEP 11/14/23   Nooruddin, Roetta, MD    Physical Exam: Vitals:   03/04/24 1410 03/04/24 1424 03/04/24 1429  BP: (!) 125/55    Pulse: 81    Resp: 16    Temp: 98.4 F (36.9 C)    TempSrc: Oral    SpO2: 100% 99%  Weight:   106.1 kg    Constitutional: Appears calm, comfortable, not in any acute distress. Vitals:   03/04/24 1410 03/04/24 1424 03/04/24 1429  BP: (!) 125/55    Pulse: 81    Resp: 16    Temp: 98.4 F (36.9 C)    TempSrc: Oral    SpO2: 100% 99%   Weight:   106.1 kg   Eyes: PERRL, lids and conjunctivae normal ENMT: Mucous membranes are moist. Posterior pharynx clear of any exudate or lesions. Neck: normal, supple, no masses, no thyromegaly Respiratory: CTA Bilaterally, no wheezing, no crackles. Normal respiratory effort.  Cardiovascular: S1+ S2 audible  Regular rate and rhythm, no murmurs / rubs / gallops. No extremity edema.  Abdomen: Soft, no tenderness, no masses palpated. No hepatosplenomegaly. Bowel sounds positive.  Musculoskeletal: No joint deformity upper and lower extremities. Good ROM, no contractures. Normal muscle tone.  Skin: no  rashes, lesions, ulcers. No induration Neurologic: CN 2-12 grossly intact. Sensation intact, DTR normal. Strength 5/5 in all 4.  Psychiatric: Normal judgment and insight. Alert and oriented x 3. Normal mood.   Labs on Admission: I have personally reviewed following labs and imaging studies  CBC: Recent Labs  Lab 03/04/24 1440  WBC 13.2*  NEUTROABS 11.6*  HGB 4.8*  HCT 17.5*  MCV 88.4  PLT 311   Basic Metabolic Panel: Recent Labs  Lab 03/04/24 1440  NA 140  K 3.9  CL 108  CO2 19*  GLUCOSE 120*  BUN 18  CREATININE 0.90  CALCIUM  8.5*   GFR: Estimated Creatinine Clearance: 73.2 mL/min (by C-G formula based on SCr of 0.9 mg/dL). Liver Function Tests: No results for input(s): AST, ALT, ALKPHOS, BILITOT, PROT, ALBUMIN in the last 168 hours. No results for input(s): LIPASE, AMYLASE in the last 168 hours. No results for input(s): AMMONIA in the last 168 hours. Coagulation Profile: No results for input(s): INR, PROTIME in the last 168 hours. Cardiac Enzymes: No results for input(s): CKTOTAL, CKMB, CKMBINDEX, TROPONINI in the last 168 hours. BNP (last 3 results) No results for input(s): PROBNP in the last 8760 hours. HbA1C: No results for input(s): HGBA1C in the last 72 hours. CBG: No results for input(s): GLUCAP in the last 168 hours. Lipid Profile: No results for input(s): CHOL, HDL, LDLCALC, TRIG, CHOLHDL, LDLDIRECT in the last 72 hours. Thyroid Function Tests: No results for input(s): TSH, T4TOTAL, FREET4, T3FREE, THYROIDAB in the last 72 hours. Anemia Panel: No results for input(s): VITAMINB12, FOLATE, FERRITIN, TIBC, IRON , RETICCTPCT in the last 72 hours. Urine analysis:    Component Value Date/Time   COLORURINE YELLOW 12/24/2023 1721   APPEARANCEUR HAZY (A) 12/24/2023 1721   LABSPEC 1.015 12/24/2023 1721   PHURINE 5.0 12/24/2023 1721   GLUCOSEU NEGATIVE 12/24/2023 1721   HGBUR NEGATIVE  12/24/2023 1721   BILIRUBINUR NEGATIVE 12/24/2023 1721   KETONESUR NEGATIVE 12/24/2023 1721   PROTEINUR 30 (A) 12/24/2023 1721   UROBILINOGEN 1.0 08/24/2013 2121   NITRITE NEGATIVE 12/24/2023 1721   LEUKOCYTESUR NEGATIVE 12/24/2023 1721    Radiological Exams on Admission: DG Chest 2 View Result Date: 03/04/2024 CLINICAL DATA:  Porta cough and fatigue. EXAM: CHEST - 2 VIEW COMPARISON:  Chest radiograph dated 04/18/2023. FINDINGS: Right-sided Port-A-Cath with tip at the cavoatrial junction. No focal consolidation, pleural effusion or pneumothorax. Borderline cardiomegaly. No acute osseous pathology. IMPRESSION: No active cardiopulmonary disease. Electronically Signed   By: Vanetta Chou M.D.   On: 03/04/2024 16:13    EKG: Please follow EKG as ordered  Assessment/Plan Principal Problem:   Acute upper GI bleeding Active Problems:   HLD (hyperlipidemia)   Essential hypertension   Obesity, Class III, BMI 40-49.9 (morbid obesity) (HCC)   Osteoarthritis, multiple sites   Insomnia   Major depressive disorder, recurrent episode   GAD (generalized anxiety disorder)   Tobacco use disorder  Acute upper GI bleed / Melena: Patient presented with fatigue, malaise and found to have black-colored stool in the morning today. Hemoglobin on arrival 4.6.  Baseline hemoglobin between 7 and 8.  FOBT+ She is hemodynamically stable.  She denies further episodes. Transfuse 2 unit PRBC. Continue Protonix  40 mg IV every 12 hours. Continue IV fluid resuscitation. Monitor H&H every 8 hours. GI is consulted, clear liquid diet now. N.p.o. midnight for possible procedure in the AM.  Essential hypertension: Hold blood pressure medication as blood pressure is on the soft side.  Major depressive disorder: Anxiety disorder: Continue Celexa .  Insomnia: Continue trazodone  as needed.  Obesity: Diet and exercise discussed in detail.  DM 2: Diet controlled.  Patient not on any medications. Obtain  hemoglobin A1c  DVT prophylaxis: SCDs Code Status: Full code Family Communication: No family at bed side Disposition Plan:  Status is: Inpatient Remains inpatient appropriate because: Admitted for GI bleeding, GI is consulted.     Consults called: [facility] Admission status: Inpatient.  Darcel Dawley MD [facility] If 7PM-7AM, please contact night-coverage [email or web address]   03/04/2024, 5:32 PM

## 2024-03-04 NOTE — ED Triage Notes (Signed)
 BIB [facility] from home for productive cough, as well as fatigue, malaise, nausea. Onset 2d ago. Denies fever, sob, syncope, dizziness. Mentions R foot wound being seen and treated by wound care. R ankle pain 8/10. VSS. A&Ox4. Ambulatory. Sitting in w/c. Alert, NAD, calm, interactive.

## 2024-03-04 NOTE — ED Notes (Signed)
 Pt transported to Xray.

## 2024-03-05 DIAGNOSIS — I780 Hereditary hemorrhagic telangiectasia: Secondary | ICD-10-CM

## 2024-03-05 DIAGNOSIS — R051 Acute cough: Secondary | ICD-10-CM

## 2024-03-05 DIAGNOSIS — D62 Acute posthemorrhagic anemia: Secondary | ICD-10-CM | POA: Diagnosis not present

## 2024-03-05 DIAGNOSIS — K921 Melena: Secondary | ICD-10-CM | POA: Diagnosis not present

## 2024-03-05 DIAGNOSIS — K922 Gastrointestinal hemorrhage, unspecified: Secondary | ICD-10-CM | POA: Diagnosis not present

## 2024-03-05 LAB — COMPREHENSIVE METABOLIC PANEL WITH GFR
ALT: 7 U/L (ref 0–44)
AST: 11 U/L — ABNORMAL LOW (ref 15–41)
Albumin: 2.7 g/dL — ABNORMAL LOW (ref 3.5–5.0)
Alkaline Phosphatase: 61 U/L (ref 38–126)
Anion gap: 9 (ref 5–15)
BUN: 21 mg/dL (ref 8–23)
CO2: 21 mmol/L — ABNORMAL LOW (ref 22–32)
Calcium: 8.2 mg/dL — ABNORMAL LOW (ref 8.9–10.3)
Chloride: 109 mmol/L (ref 98–111)
Creatinine, Ser: 0.73 mg/dL (ref 0.44–1.00)
GFR, Estimated: >60 mL/min (ref >60)
Glucose, Bld: 86 mg/dL (ref 70–99)
Potassium: 3.9 mmol/L (ref 3.5–5.1)
Sodium: 139 mmol/L (ref 135–145)
Total Bilirubin: 0.7 mg/dL (ref 0.0–1.2)
Total Protein: 5.5 g/dL — ABNORMAL LOW (ref 6.5–8.1)

## 2024-03-05 LAB — GLUCOSE, CAPILLARY
Glucose-Capillary: 100 mg/dL — ABNORMAL HIGH (ref 70–99)
Glucose-Capillary: 113 mg/dL — ABNORMAL HIGH (ref 70–99)

## 2024-03-05 LAB — CBC
HCT: 20.4 % — ABNORMAL LOW (ref 36.0–46.0)
Hemoglobin: 6.3 g/dL — CL (ref 12.0–15.0)
MCH: 25.1 pg — ABNORMAL LOW (ref 26.0–34.0)
MCHC: 30.9 g/dL (ref 30.0–36.0)
MCV: 81.3 fL (ref 80.0–100.0)
Platelets: 260 K/uL (ref 150–400)
RBC: 2.51 MIL/uL — ABNORMAL LOW (ref 3.87–5.11)
RDW: 21.6 % — ABNORMAL HIGH (ref 11.5–15.5)
WBC: 11.1 K/uL — ABNORMAL HIGH (ref 4.0–10.5)
nRBC: 0 % (ref 0.0–0.2)

## 2024-03-05 LAB — PHOSPHORUS: Phosphorus: 3.4 mg/dL (ref 2.5–4.6)

## 2024-03-05 LAB — PREPARE RBC (CROSSMATCH)

## 2024-03-05 LAB — HEMOGLOBIN AND HEMATOCRIT, BLOOD
HCT: 25.9 % — ABNORMAL LOW (ref 36.0–46.0)
Hemoglobin: 8.1 g/dL — ABNORMAL LOW (ref 12.0–15.0)

## 2024-03-05 LAB — RESP PANEL BY RT-PCR (RSV, FLU A&B, COVID)  RVPGX2
Influenza A by PCR: NEGATIVE
Influenza B by PCR: NEGATIVE
Resp Syncytial Virus by PCR: NEGATIVE
SARS Coronavirus 2 by RT PCR: NEGATIVE

## 2024-03-05 LAB — MAGNESIUM: Magnesium: 1.8 mg/dL (ref 1.7–2.4)

## 2024-03-05 MED ORDER — SODIUM CHLORIDE 0.9% IV SOLUTION
Freq: Once | INTRAVENOUS | Status: AC
Start: 2024-03-05 — End: 2024-03-05

## 2024-03-05 MED ORDER — OXYMETAZOLINE HCL 0.05 % NA SOLN
1.0000 | Freq: Two times a day (BID) | NASAL | Status: DC
Start: 2024-03-06 — End: 2024-03-08
  Filled 2024-03-05: qty 30

## 2024-03-05 MED ORDER — FENTANYL CITRATE (PF) 50 MCG/ML IJ SOSY
12.5000 ug | PREFILLED_SYRINGE | Freq: Once | INTRAMUSCULAR | Status: AC
  Administered 2024-03-05: 12.5 ug via INTRAVENOUS
  Filled 2024-03-05: qty 1

## 2024-03-05 MED ORDER — SODIUM CHLORIDE 0.9 % IV SOLN: INTRAVENOUS | Status: DC

## 2024-03-05 MED ORDER — AMLODIPINE BESYLATE 10 MG PO TABS
10.0000 mg | ORAL_TABLET | Freq: Every day | ORAL | Status: DC
  Administered 2024-03-05 – 2024-03-07 (×3): 10 mg via ORAL
  Filled 2024-03-05 (×3): qty 1

## 2024-03-05 MED ORDER — FENTANYL CITRATE (PF) 50 MCG/ML IJ SOSY
6.2500 ug | PREFILLED_SYRINGE | Freq: Once | INTRAMUSCULAR | Status: AC
  Administered 2024-03-05: 6.5 ug via INTRAVENOUS
  Filled 2024-03-05: qty 1

## 2024-03-05 MED ORDER — ALPRAZOLAM 0.5 MG PO TABS
0.5000 mg | ORAL_TABLET | Freq: Every day | ORAL | Status: DC
  Administered 2024-03-05 – 2024-03-06 (×2): 0.5 mg via ORAL
  Filled 2024-03-05 (×2): qty 1

## 2024-03-05 NOTE — Progress Notes (Signed)
 PROGRESS NOTE    Peggy House  FMW:[id number] DOB: 1960/10/30 DOA: 03/04/2024 PCP: Nooruddin, Saad, MD   Brief Narrative:  This 63 y.o. female with PMH significant for essential hypertension, obesity, major depressive disorder, type 2 diabetes, anxiety disorder, GERD, presented in the ED with complaints of fatigue, malaise and dry cough for few days.  She has noticed large black-colored stools associated with dizziness in the morning yesterday.  Hemoglobin on arrival 4.8, has received 2 units of PRBC hemoglobin improved to 6.8.  Admitted for further evaluation.  GI is consulted and scheduled for EGD tomorrow.  Assessment & Plan:   Principal Problem:   Acute upper GI bleeding Active Problems:   HLD (hyperlipidemia)   Essential hypertension   Obesity, Class III, BMI 40-49.9 (morbid obesity) (HCC)   Osteoarthritis, multiple sites   Insomnia   Major depressive disorder, recurrent episode   GAD (generalized anxiety disorder)   Tobacco use disorder  Acute upper GI bleed / Melena: Patient presented with fatigue, malaise and found to have black-colored stool in the morning yesterday. Hemoglobin on arrival 4.6.  Baseline hemoglobin between 7 and 8.  FOBT+ She is hemodynamically stable.  She denies further episodes. Status post 2 units PRBC,  hemoglobin improved to 6.8.  Getting 1 more unit PRBC. Continue Protonix  40 mg IV every 12 hours. Continue IV fluid resuscitation. Monitor H&H every 8 hours. GI is consulted, clear liquid diet now. N.p.o. midnight for possible EGD in the AM.   Essential hypertension: Continue amlodipine  10 mg daily  Major depressive disorder: Anxiety disorder: Continue Celexa . Continue Xanax  as needed.   Insomnia: Continue trazodone  as needed.   Obesity: Diet and exercise discussed in detail.   DM 2: Diet controlled.  Patient not on any medications. Obtain hemoglobin A1c   DVT prophylaxis: SCDs Code Status: Full code Family Communication:  No family at bedside Disposition Plan:    Status is: Inpatient Remains inpatient appropriate because: Admitted for GI evaluation,  scheduled for EGD tomorrow.   Consultants:  Gastroenterology  Procedures: Scheduled EGD tomorrow Antimicrobials: None  Subjective: Patient was seen and examined at bedside.  Overnight events noted. Patient reports doing much better, she denies any further episodes of black stools.   She has received 2 units of PRBC.  Hemoglobin improved to 6.8.  Getting 1 more unit PRBC.  Objective: Vitals:   03/05/24 0935 03/05/24 0951 03/05/24 1100 03/05/24 1209  BP: (!) 163/78 (!) 156/81 (!) 145/53 (!) 144/91  Pulse: 66 74 66 68  Resp: 17 19 17 16   Temp: 98.3 F (36.8 C) 98.3 F (36.8 C)  98.2 F (36.8 C)  TempSrc: Oral Oral  Oral  SpO2: 97% 100% 100% 100%  Weight:        Intake/Output Summary (Last 24 hours) at 03/05/2024 1315 Last data filed at 03/05/2024 1210 Gross per 24 hour  Intake 745.57 ml  Output --  Net 745.57 ml   Filed Weights   03/04/24 1429  Weight: 106.1 kg    Examination:  General exam: Appears calm and comfortable, not in any acute distress. Respiratory system: Clear to auscultation. Respiratory effort normal.  RR 14 Cardiovascular system: S1 & S2 heard, RRR. No JVD, murmurs, rubs, gallops or clicks.  Gastrointestinal system: Abdomen is non distended, soft and non tender. Normal bowel sounds heard. Central nervous system: Alert and oriented x 3. No focal neurological deficits. Extremities: No edema, no cyanosis, no clubbing. Skin: No rashes, lesions or ulcers Psychiatry: Judgement and insight appear normal. Mood &  affect appropriate.    Data Reviewed: I have personally reviewed following labs and imaging studies  CBC: Recent Labs  Lab 03/04/24 1440 03/05/24 0452  WBC 13.2* 11.1*  NEUTROABS 11.6*  --   HGB 4.8* 6.3*  HCT 17.5* 20.4*  MCV 88.4 81.3  PLT 311 260   Basic Metabolic Panel: Recent Labs  Lab  03/04/24 1440 03/05/24 0452  NA 140 139  K 3.9 3.9  CL 108 109  CO2 19* 21*  GLUCOSE 120* 86  BUN 18 21  CREATININE 0.90 0.73  CALCIUM  8.5* 8.2*  MG  --  1.8  PHOS  --  3.4   GFR: Estimated Creatinine Clearance: 82.4 mL/min (by C-G formula based on SCr of 0.73 mg/dL). Liver Function Tests: Recent Labs  Lab 03/05/24 0452  AST 11*  ALT 7  ALKPHOS 61  BILITOT 0.7  PROT 5.5*  ALBUMIN 2.7*   No results for input(s): LIPASE, AMYLASE in the last 168 hours. No results for input(s): AMMONIA in the last 168 hours. Coagulation Profile: No results for input(s): INR, PROTIME in the last 168 hours. Cardiac Enzymes: No results for input(s): CKTOTAL, CKMB, CKMBINDEX, TROPONINI in the last 168 hours. BNP (last 3 results) No results for input(s): PROBNP in the last 8760 hours. HbA1C: No results for input(s): HGBA1C in the last 72 hours. CBG: No results for input(s): GLUCAP in the last 168 hours. Lipid Profile: No results for input(s): CHOL, HDL, LDLCALC, TRIG, CHOLHDL, LDLDIRECT in the last 72 hours. Thyroid Function Tests: No results for input(s): TSH, T4TOTAL, FREET4, T3FREE, THYROIDAB in the last 72 hours. Anemia Panel: No results for input(s): VITAMINB12, FOLATE, FERRITIN, TIBC, IRON , RETICCTPCT in the last 72 hours. Sepsis Labs: No results for input(s): PROCALCITON, LATICACIDVEN in the last 168 hours.  Recent Results (from the past 240 hours)  Resp panel by RT-PCR (RSV, Flu A&B, Covid) Anterior Nasal Swab     Status: None   Collection Time: 03/05/24  4:51 AM   Specimen: Anterior Nasal Swab  Result Value Ref Range Status   SARS Coronavirus 2 by RT PCR NEGATIVE NEGATIVE Final   Influenza A by PCR NEGATIVE NEGATIVE Final   Influenza B by PCR NEGATIVE NEGATIVE Final    Comment: (NOTE) The Xpert Xpress SARS-CoV-2/FLU/RSV plus assay is intended as an aid in the diagnosis of influenza from Nasopharyngeal swab  specimens and should not be used as a sole basis for treatment. Nasal washings and aspirates are unacceptable for Xpert Xpress SARS-CoV-2/FLU/RSV testing.  Fact Sheet for Patients: bloggercourse.com  Fact Sheet for Healthcare Providers: seriousbroker.it  This test is not yet approved or cleared by the [place]  FDA and has been authorized for detection and/or diagnosis of SARS-CoV-2 by FDA under an Emergency Use Authorization (EUA). This EUA will remain in effect (meaning this test can be used) for the duration of the COVID-19 declaration under Section 564(b)(1) of the Act, 21 U.S.C. section 360bbb-3(b)(1), unless the authorization is terminated or revoked.     Resp Syncytial Virus by PCR NEGATIVE NEGATIVE Final    Comment: (NOTE) Fact Sheet for Patients: bloggercourse.com  Fact Sheet for Healthcare Providers: seriousbroker.it  This test is not yet approved or cleared by the [place]  FDA and has been authorized for detection and/or diagnosis of SARS-CoV-2 by FDA under an Emergency Use Authorization (EUA). This EUA will remain in effect (meaning this test can be used) for the duration of the COVID-19 declaration under Section 564(b)(1) of the Act, 21 U.S.C. section 360bbb-3(b)(1), unless  the authorization is terminated or revoked.  Performed at [facility], [address], [place], [place] [address]    Radiology Studies: DG Chest 2 View Result Date: 03/04/2024 CLINICAL DATA:  Porta cough and fatigue. EXAM: CHEST - 2 VIEW COMPARISON:  Chest radiograph dated 04/18/2023. FINDINGS: Right-sided Port-A-Cath with tip at the cavoatrial junction. No focal consolidation, pleural effusion or pneumothorax. Borderline cardiomegaly. No acute osseous pathology. IMPRESSION: No active cardiopulmonary disease. Electronically Signed   By: Vanetta Chou M.D.   On: 03/04/2024  16:13   Scheduled Meds:  citalopram   20 mg Oral Daily   docusate sodium   100 mg Oral BID   pantoprazole  (PROTONIX ) IV  40 mg Intravenous Q12H   Continuous Infusions:  sodium chloride  Stopped (03/05/24 0222)     LOS: 1 day    Time spent: 35 mins    Darcel Dawley, MD [facility]   If 7PM-7AM, please contact night-coverage

## 2024-03-05 NOTE — Plan of Care (Signed)
   Problem: Education: Goal: Knowledge of General Education information will improve Description: Including pain rating scale, medication(s)/side effects and non-pharmacologic comfort measures Outcome: Progressing   Problem: Clinical Measurements: Goal: Ability to maintain clinical measurements within normal limits will improve Outcome: Progressing Goal: Will remain free from infection Outcome: Progressing

## 2024-03-05 NOTE — Consult Note (Addendum)
 Referring Provider: [facility] Primary Care Physician:  Nooruddin, Saad, MD Primary Gastroenterologist: Patient dismissed from [facility]  Reason for Consultation: Melena, symptomatic anemia, hemoglobin of 4.8 on admission  HPI: Peggy House is a 63 y.o. female with past medical history of recurrent GI bleed, history of gastric and colonic AVMs as well as gastric ulcers, hypertension, anxiety, major depressive disorder presented to the hospital with fatigue and dry cough.  Was noted to have black-colored stool.  Hemoglobin of 4.8.  GI is consulted for further evaluation.  Patient has received blood transfusion.  Hemoglobin improved to 6.3 this morning.   She  is complaining of intermittent black stool for last few days.  Complaining of fatigue and weakness.  Had some nausea denies any vomiting.  Denies seeing bright red blood per rectum.   Patient with history of recurrent upper GI bleed likely from ulcer disease.  Multiple endoscopies in the last few years.  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  Also had previously retained clip in the stomach.  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  Pathology showed hyperplastic polyp.  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    Past Surgical History:  Procedure Laterality Date   ABDOMINAL HYSTERECTOMY     APPLICATION OF WOUND VAC Right 07/28/2023   Procedure: APPLICATION, WOUND VAC;  Surgeon: Lowery Estefana RAMAN, DO;  [facility]: [facility];  Service: Plastics;   Laterality: Right;   APPLICATION, SKIN SUBSTITUTE Right 07/28/2023   Procedure: APPLICATION, SKIN SUBSTITUTE;  Surgeon: Lowery Estefana RAMAN, DO;  Location: [facility];  Service: Plastics;  Laterality: Right;   CARPAL TUNNEL RELEASE  05/13/2011   Procedure: CARPAL TUNNEL RELEASE;  Surgeon: Eva Elsie Herring, MD;  Location: [facility];  Service: Orthopedics;  Laterality: Left;   COLONOSCOPY N/A 03/02/2020   Procedure: COLONOSCOPY;  Surgeon: Donnald Charleston, MD;  Location: [facility]COPY;  Service: Endoscopy;  Laterality: N/A;   COLONOSCOPY WITH PROPOFOL  N/A 04/28/2014   Procedure: COLONOSCOPY WITH PROPOFOL ;  Surgeon: Charleston Donnald GAILS, MD;  Location: [facility] ENDOSCOPY;  Service: Endoscopy;  Laterality: N/A;   DG TOES*L*  2/10   rt   DILATION AND CURETTAGE OF UTERUS     ENTEROSCOPY N/A 10/17/2017   Procedure: ENTEROSCOPY;  Surgeon: Elicia Claw, MD;  [facility]: [facility];  Service: Gastroenterology;  Laterality: N/A;   ESOPHAGOGASTRODUODENOSCOPY N/A 04/10/2014   Procedure: ESOPHAGOGASTRODUODENOSCOPY (EGD);  Surgeon: Jerrell KYM Sol, MD;  Location: [facility] ENDOSCOPY;  Service: Endoscopy;  Laterality: N/A;   ESOPHAGOGASTRODUODENOSCOPY N/A 05/10/2017   Procedure: ESOPHAGOGASTRODUODENOSCOPY (EGD);  Surgeon: Donnald Charleston, MD;  Location: [facility] ENDOSCOPY;  Service: Endoscopy;  Laterality: N/A;   ESOPHAGOGASTRODUODENOSCOPY N/A 09/22/2017   Procedure: ESOPHAGOGASTRODUODENOSCOPY (EGD);  Surgeon: Rosalie Kitchens, MD;  Location: [facility] ENDOSCOPY;  Service: Endoscopy;  Laterality: N/A;  bedside   ESOPHAGOGASTRODUODENOSCOPY N/A 03/02/2020   Procedure: ESOPHAGOGASTRODUODENOSCOPY (EGD);  Surgeon: Donnald Charleston, MD;  Location: [facility];  Service: Endoscopy;  Laterality: N/A;   ESOPHAGOGASTRODUODENOSCOPY  N/A 11/03/2020   Procedure: ESOPHAGOGASTRODUODENOSCOPY (EGD);  Surgeon: Dianna Specking, MD;  Location: [facility] ENDOSCOPY;  Service: Endoscopy;  Laterality: N/A;   ESOPHAGOGASTRODUODENOSCOPY N/A 04/12/2022    Procedure: ESOPHAGOGASTRODUODENOSCOPY (EGD);  Surgeon: Rosalie Kitchens, MD;  Location: [facility];  Service: Gastroenterology;  Laterality: N/A;   ESOPHAGOGASTRODUODENOSCOPY N/A 05/27/2022   Procedure: ESOPHAGOGASTRODUODENOSCOPY (EGD);  Surgeon: Burnette Fallow, MD;  Location: [facility];  Service: Gastroenterology;  Laterality: N/A;   ESOPHAGOGASTRODUODENOSCOPY N/A 09/27/2022   Procedure: ESOPHAGOGASTRODUODENOSCOPY (EGD);  Surgeon: Kriss Estefana DEL, DO;  Location: [facility] ENDOSCOPY;  Service: Gastroenterology;  Laterality: N/A;   ESOPHAGOGASTRODUODENOSCOPY (EGD) WITH PROPOFOL  N/A 04/27/2014   Procedure: ESOPHAGOGASTRODUODENOSCOPY (EGD) WITH PROPOFOL ;  Surgeon: Lamar Donnald GAILS, MD;  Location: [facility] ENDOSCOPY;  Service: Endoscopy;  Laterality: N/A;  possible apc   ESOPHAGOGASTRODUODENOSCOPY (EGD) WITH PROPOFOL  N/A 09/30/2017   Procedure: ESOPHAGOGASTRODUODENOSCOPY (EGD) WITH PROPOFOL ;  Surgeon: Saintclair Jasper, MD;  Location: [facility] ENDOSCOPY;  Service: Gastroenterology;  Laterality: N/A;   ESOPHAGOGASTRODUODENOSCOPY (EGD) WITH PROPOFOL  N/A 10/01/2017   Procedure: ESOPHAGOGASTRODUODENOSCOPY (EGD) WITH PROPOFOL ;  Surgeon: Saintclair Jasper, MD;  Location: [facility] ENDOSCOPY;  Service: Gastroenterology;  Laterality: N/A;   ESOPHAGOGASTRODUODENOSCOPY (EGD) WITH PROPOFOL  N/A 10/08/2017   Procedure: ESOPHAGOGASTRODUODENOSCOPY (EGD) WITH PROPOFOL ;  Surgeon: Elicia Claw, MD;  [facility];  Service: Gastroenterology;  Laterality: N/A;   ESOPHAGOGASTRODUODENOSCOPY (EGD) WITH PROPOFOL  N/A 10/17/2017   Procedure: ESOPHAGOGASTRODUODENOSCOPY (EGD) WITH PROPOFOL ;  Surgeon: Elicia Claw, MD;  [facility];  Service: Gastroenterology;  Laterality: N/A;   ESOPHAGOGASTRODUODENOSCOPY (EGD) WITH PROPOFOL  N/A 10/19/2017   Procedure: ESOPHAGOGASTRODUODENOSCOPY (EGD) WITH PROPOFOL ;  Surgeon: Elicia Claw, MD;  [facility];  Service: Gastroenterology;  Laterality: N/A;   ESOPHAGOGASTRODUODENOSCOPY (EGD) WITH  PROPOFOL  N/A 12/04/2018   Procedure: ESOPHAGOGASTRODUODENOSCOPY (EGD) WITH PROPOFOL ;  Surgeon: Dianna Specking, MD;  Location: [facility];  Service: Endoscopy;  Laterality: N/A;   GIVENS CAPSULE STUDY N/A 10/02/2017   Procedure: GIVENS CAPSULE STUDY;  Surgeon: Saintclair Jasper, MD;  Location: [facility] ENDOSCOPY;  Service: Gastroenterology;  Laterality: N/A;   GIVENS CAPSULE STUDY N/A 10/08/2017   Procedure: GIVENS CAPSULE STUDY;  Surgeon: Elicia Claw, MD;  [facility];  Service: Gastroenterology;  Laterality: N/A;  endoscopic placement of capsule   GIVENS CAPSULE STUDY N/A 03/02/2020   Procedure: GIVENS CAPSULE STUDY;  Surgeon: Donnald Lamar, MD;  Location: [facility];  Service: Endoscopy;  Laterality: N/A;   HARDWARE REMOVAL Right 07/15/2023   Procedure: REMOVAL, HARDWARE;  Surgeon: Sherida Adine BROCKS, MD;  [facility];  Service: Orthopedics;  Laterality: Right;   HEMOSTASIS CLIP PLACEMENT  12/04/2018   Procedure: HEMOSTASIS CLIP PLACEMENT;  Surgeon: Dianna Specking, MD;  Location: [facility];  Service: Endoscopy;;   HEMOSTASIS CLIP PLACEMENT  05/27/2022   Procedure: HEMOSTASIS CLIP PLACEMENT;  Surgeon: Burnette Fallow, MD;  Location: [facility];  Service: Gastroenterology;;   HEMOSTASIS CLIP PLACEMENT  09/27/2022   Procedure: HEMOSTASIS CLIP PLACEMENT;  Surgeon: Kriss Estefana DEL, DO;  Location: [facility] ENDOSCOPY;  Service: Gastroenterology;;   HEMOSTASIS CONTROL  05/27/2022   Procedure: HEMOSTASIS CONTROL;  Surgeon: Burnette Fallow, MD;  Location: [facility];  Service: Gastroenterology;;   HOT HEMOSTASIS N/A 04/27/2014   Procedure: HOT HEMOSTASIS (ARGON PLASMA COAGULATION/BICAP);  Surgeon: Lamar Donnald GAILS, MD;  Location: [facility] ENDOSCOPY;  Service: Endoscopy;  Laterality: N/A;   HOT HEMOSTASIS N/A 09/30/2017   Procedure: HOT HEMOSTASIS (ARGON PLASMA COAGULATION/BICAP);  Surgeon: Saintclair Jasper, MD;  Location: [facility] ENDOSCOPY;  Service: Gastroenterology;  Laterality: N/A;   HOT HEMOSTASIS N/A  10/01/2017   Procedure: HOT HEMOSTASIS (  ARGON PLASMA COAGULATION/BICAP);  Surgeon: Saintclair Jasper, MD;  Location: [facility] ENDOSCOPY;  Service: Gastroenterology;  Laterality: N/A;   HOT HEMOSTASIS N/A 10/17/2017   Procedure: HOT HEMOSTASIS (ARGON PLASMA COAGULATION/BICAP);  Surgeon: Elicia Claw, MD;  Location: [facility] ENDOSCOPY;  Service: Gastroenterology;  Laterality: N/A;   HOT HEMOSTASIS N/A 10/19/2017   Procedure: HOT HEMOSTASIS (ARGON PLASMA COAGULATION/BICAP);  Surgeon: Elicia Claw, MD;  Location: [facility] ENDOSCOPY;  Service: Gastroenterology;  Laterality: N/A;   HOT HEMOSTASIS N/A 03/02/2020   Procedure: HOT HEMOSTASIS (ARGON PLASMA COAGULATION/BICAP);  Surgeon: Donnald Charleston, MD;  Location: [facility];  Service: Endoscopy;  Laterality: N/A;   HOT HEMOSTASIS N/A 04/12/2022   Procedure: HOT HEMOSTASIS (ARGON PLASMA COAGULATION/BICAP);  Surgeon: Rosalie Kitchens, MD;  Location: [facility];  Service: Gastroenterology;  Laterality: N/A;   INCISION AND DRAINAGE OF WOUND Right 07/21/2023   Procedure: IRRIGATION AND DEBRIDEMENT WOUND;  Surgeon: Sherida Adine BROCKS, MD;  [facility];  Service: Orthopedics;  Laterality: Right;  RIGHT ANKLE WOUND I&D   INCISION AND DRAINAGE OF WOUND Right 07/28/2023   Procedure: IRRIGATION AND DEBRIDEMENT WOUND;  Surgeon: Lowery Estefana RAMAN, DO;  Location: [facility];  Service: Plastics;  Laterality: Right;  rrigation and debridement of right ankle wound with placement of myriad and wound vac   IR IMAGING GUIDED PORT INSERTION  07/08/2018   IRRIGATION AND DEBRIDEMENT POSTERIOR HIP Right 07/15/2023   Procedure: IRRIGATION AND DEBRIDEMENT, OPEN FRACTURE;  Surgeon: Sherida Adine BROCKS, MD;  Location: [facility];  Service: Orthopedics;  Laterality: Right;   L shoulder Surgery  2011   ORIF ANKLE FRACTURE Right 04/24/2023   Procedure: OPEN REDUCTION INTERNAL FIXATION (ORIF) ANKLE FRACTURE;  Surgeon: Sherida Adine BROCKS, MD;  Location[facility];  Service: Orthopedics;  Laterality: Right;   POLYPECTOMY   03/02/2020   Procedure: POLYPECTOMY;  Surgeon: Donnald Charleston, MD;  Location: [facility]OS[facility];  Service: Endoscopy;;   SCLEROTHERAPY  11/03/2020   Procedure: MATIAS;  Surgeon: Dianna Specking, MD;  Location: [facility] ENDOSCOPY;  Service: Endoscopy;;   SUBMUCOSAL INJECTION  09/22/2017   Procedure: SUBMUCOSAL INJECTION;  Surgeon: Rosalie Kitchens, MD;  Location: [facility] ENDOSCOPY;  Service: Endoscopy;;   SUBMUCOSAL INJECTION  12/04/2018   Procedure: SUBMUCOSAL INJECTION;  Surgeon: Dianna Specking, MD;  Location: [facility] ENDOSCOPY;  Service: Endoscopy;;    Prior to Admission medications   Medication Sig Start Date End Date Taking? Authorizing Provider  Accu-Chek Softclix Lancets lancets Use to check blood sugar before breakfast and before dinner while on steroids Patient taking differently: 1 each by Other route See admin instructions. Use to check blood sugar before breakfast and before dinner while on steroids 09/27/19  Yes Arminda Daved HERO, MD  acetaminophen  (TYLENOL ) 325 MG tablet Take 2 tablets (650 mg total) by mouth every 6 (six) hours as needed. 05/19/23  Yes Raulkar, Sven SQUIBB, MD  ALPRAZolam  (XANAX ) 0.5 MG tablet Take 0.5 mg by mouth at bedtime.   Yes [provider]  aminocaproic  acid (AMICAR ) 500 MG tablet Take 2 tablets (1,000 mg total) by mouth every 12 (twelve) hours. 02/18/24  Yes Lanny Callander, MD  amLODipine  (NORVASC ) 10 MG tablet Take 1 tablet (10 mg total) by mouth daily. 06/10/23 06/09/24 Yes Gregary Sharper, MD  citalopram  (CELEXA ) 20 MG tablet TAKE 1 TABLET(20 MG) BY MOUTH DAILY Patient taking differently: Take 20 mg by mouth daily as needed (for anxiety). 08/27/23  Yes Nooruddin, Saad, MD  gabapentin  (NEURONTIN ) 100 MG capsule Take 1 capsule (100 mg total) by mouth at bedtime. 07/04/23  Yes Raulkar,  Sven SQUIBB, MD  glucose blood (ACCU-CHEK GUIDE) test strip Check blood sugar 2 times per day while on steroids before breakfast and dinner Patient taking differently: 1 each by Other route See  admin instructions. Check blood sugar 2 times per day while on steroids before breakfast and dinner 09/27/19  Yes Arminda Daved HERO, MD  leptospermum manuka honey (MEDIHONEY) PSTE paste Apply 1 Application topically daily. 12/28/23  Yes Tobie Gaines, DO  lidocaine  4 % Place 1 patch onto the skin daily. Remove & Discard patch within 12 hours or as directed by MD 12/27/23  Yes Tobie Gaines, DO  lisinopril  (ZESTRIL ) 20 MG tablet Take 1 tablet (20 mg total) by mouth daily. 06/23/23  Yes Nooruddin, Saad, MD  pantoprazole  (PROTONIX ) 40 MG tablet TAKE 1 TABLET(40 MG) BY MOUTH TWICE DAILY Patient taking differently: Take 40 mg by mouth 2 (two) times daily as needed (for acid reflux). 09/30/22  Yes Lanny Callander, MD  senna-docusate (SENOKOT-S) 8.6-50 MG tablet Take 1 tablet by mouth at bedtime. 05/19/23  Yes Raulkar, Sven SQUIBB, MD  traMADol  (ULTRAM ) 50 MG tablet Take 50 mg by mouth every 6 (six) hours as needed. for pain   Yes [provider]  traZODone  (DESYREL ) 50 MG tablet TAKE 1 TABLET(50 MG) BY MOUTH AT BEDTIME AS NEEDED FOR SLEEP 11/14/23  Yes Nooruddin, Saad, MD  potassium chloride  SA (KLOR-CON  M) 20 MEQ tablet Take 1 tablet (20 mEq total) by mouth daily. Patient not taking: Reported on 03/04/2024 08/13/23   [name], DO  PROAIR  HFA 108 (90 Base) MCG/ACT inhaler INHALE 1 TO 2 PUFFS INTO THE LUNGS EVERY 6 HOURS AS NEEDED FOR WHEEZING OR SHORTNESS OF BREATH Patient not taking: Reported on 12/24/2023 11/10/20   Lanny Callander, MD    Scheduled Meds:  citalopram   20 mg Oral Daily   docusate sodium   100 mg Oral BID   pantoprazole  (PROTONIX ) IV  40 mg Intravenous Q12H   Continuous Infusions:  sodium chloride  Stopped (03/05/24 0222)   PRN Meds:.acetaminophen , ondansetron  **OR** ondansetron  (ZOFRAN ) IV, traZODone   Allergies as of 03/04/2024 - Review Complete 03/04/2024  Allergen Reaction Noted   Feraheme  [ferumoxytol ] Other (See Comments) 10/09/2017   Nsaids Other (See Comments) 04/25/2014   Wasp venom  Anaphylaxis 10/06/2017   Tomato Hives 01/01/2012   Iron  (ferrous sulfate ) [ferrous sulfate  er] Other (See Comments) 03/07/2020    Family History  Problem Relation Age of Onset   Cancer Mother        Ovarian   Ovarian cancer Mother    Diabetes Mother    Kidney disease Mother    Bleeding Disorder Mother    Diabetes type II Sister    Bleeding Disorder Sister    Diabetes Sister    Colon cancer Maternal Grandfather    Crohn's disease Maternal Grandfather    Stomach cancer Maternal Grandmother    Kidney disease Son    Bleeding Disorder Son    Dysmenorrhea Neg Hx     Social History   Socioeconomic History   Marital status: Widowed    Spouse name: Not on file   Number of children: 3   Years of education: 64   Highest education level: Not on file  Occupational History   Not on file  Tobacco Use   Smoking status: Every Day    Current packs/day: 0.50    Average packs/day: 0.5 packs/day for 30.0 years (15.0 ttl pk-yrs)    Types: Cigarettes   Smokeless tobacco: Former    Types: Snuff  Quit date: 57   Tobacco comments:    1/2 PPD  Vaping Use   Vaping status: Never Used  Substance and Sexual Activity   Alcohol use: No    Alcohol/week: 0.0 standard drinks of alcohol   Drug use: No    Comment: last cocaine-2010   Sexual activity: Not Currently    Comment: Hysterectomy  Other Topics Concern   Not on file  Social History Narrative   Not on file   Social Drivers of Health   Financial Resource Strain: Not on file  Food Insecurity: No Food Insecurity (12/25/2023)   Hunger Vital Sign    Worried About Running Out of Food in the Last Year: Never true    Ran Out of Food in the Last Year: Never true  Transportation Needs: No Transportation Needs (12/25/2023)   PRAPARE - Administrator, Civil Service (Medical): No    Lack of Transportation (Non-Medical): No  Physical Activity: Not on file  Stress: Not on file  Social Connections: Not on file  Intimate Partner  Violence: Not At Risk (12/25/2023)   Humiliation, Afraid, Rape, and Kick questionnaire    Fear of Current or Ex-Partner: No    Emotionally Abused: No    Physically Abused: No    Sexually Abused: No    Review of Systems: All negative except as stated above in HPI.  Physical Exam: Vital signs: Vitals:   03/05/24 0935 03/05/24 0951  BP: (!) 163/78 (!) 156/81  Pulse: 66 74  Resp: 17 19  Temp: 98.3 F (36.8 C) 98.3 F (36.8 C)  SpO2: 97% 100%     General:   Alert,  Well-developed, well-nourished, pleasant and cooperative in NAD Lungs: No visible respiratory distress Heart:  Regular rate and rhythm; no murmurs, clicks, rubs,  or gallops. Abdomen: Soft, nontender, nondistended, bowel sound present, no peritoneal signs Mood and affect normal Alert and oriented x 3 Rectal:  Deferred  GI:  Lab Results: Recent Labs    03/04/24 1440 03/05/24 0452  WBC 13.2* 11.1*  HGB 4.8* 6.3*  HCT 17.5* 20.4*  PLT 311 260   BMET Recent Labs    03/04/24 1440 03/05/24 0452  NA 140 139  K 3.9 3.9  CL 108 109  CO2 19* 21*  GLUCOSE 120* 86  BUN 18 21  CREATININE 0.90 0.73  CALCIUM  8.5* 8.2*   LFT Recent Labs    03/05/24 0452  PROT 5.5*  ALBUMIN 2.7*  AST 11*  ALT 7  ALKPHOS 61  BILITOT 0.7   PT/INR No results for input(s): LABPROT, INR in the last 72 hours.   Studies/Results: DG Chest 2 View Result Date: 03/04/2024 CLINICAL DATA:  Porta cough and fatigue. EXAM: CHEST - 2 VIEW COMPARISON:  Chest radiograph dated 04/18/2023. FINDINGS: Right-sided Port-A-Cath with tip at the cavoatrial junction. No focal consolidation, pleural effusion or pneumothorax. Borderline cardiomegaly. No acute osseous pathology. IMPRESSION: No active cardiopulmonary disease. Electronically Signed   By: Vanetta Chou M.D.   On: 03/04/2024 16:13    Impression/Plan: -Symptomatic anemia with hemoglobin of 4.8 on admission.  Hemoglobin improved to 6.3 with blood transfusion.  Getting another unit  of blood transfusion now. - Recurrent GI bleed with melena.  History of gastric and colonic AVMs in the past.  Recommendations -------------------------- - Recommend EGD tomorrow.  If EGD negative or if does not explain significant drop in hemoglobin then she may need repeat colonoscopy as well as capsule endoscopy to rule out small bowel AVMs. -  Okay to have soft diet today.  Keep n.p.o. past midnight.  Risks (bleeding, infection, bowel perforation that could require surgery, sedation-related changes in cardiopulmonary systems), benefits (identification and possible treatment of source of symptoms, exclusion of certain causes of symptoms), and alternatives (watchful waiting, radiographic imaging studies, empiric medical treatment)  were explained to patient/family in detail and patient wishes to proceed.     LOS: 1 day   Layla Lah  MD, FACP 03/05/2024, 10:27 AM  Contact #  [phone]

## 2024-03-05 NOTE — H&P (View-Only) (Signed)
 Referring Provider: [facility] Primary Care Physician:  Nooruddin, Saad, MD Primary Gastroenterologist: Patient dismissed from [facility]  Reason for Consultation: Melena, symptomatic anemia, hemoglobin of 4.8 on admission  HPI: Peggy House is a 63 y.o. female with past medical history of recurrent GI bleed, history of gastric and colonic AVMs as well as gastric ulcers, hypertension, anxiety, major depressive disorder presented to the hospital with fatigue and dry cough.  Was noted to have black-colored stool.  Hemoglobin of 4.8.  GI is consulted for further evaluation.  Patient has received blood transfusion.  Hemoglobin improved to 6.3 this morning.   She  is complaining of intermittent black stool for last few days.  Complaining of fatigue and weakness.  Had some nausea denies any vomiting.  Denies seeing bright red blood per rectum.   Patient with history of recurrent upper GI bleed likely from ulcer disease.  Multiple endoscopies in the last few years.  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  Also had previously retained clip in the stomach.  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  Pathology showed hyperplastic polyp.  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    Past Surgical History:  Procedure Laterality Date   ABDOMINAL HYSTERECTOMY     APPLICATION OF WOUND VAC Right 07/28/2023   Procedure: APPLICATION, WOUND VAC;  Surgeon: Lowery Estefana RAMAN, DO;  [facility]: [facility];  Service: Plastics;   Laterality: Right;   APPLICATION, SKIN SUBSTITUTE Right 07/28/2023   Procedure: APPLICATION, SKIN SUBSTITUTE;  Surgeon: Lowery Estefana RAMAN, DO;  Location: [facility];  Service: Plastics;  Laterality: Right;   CARPAL TUNNEL RELEASE  05/13/2011   Procedure: CARPAL TUNNEL RELEASE;  Surgeon: Eva Elsie Herring, MD;  Location: [facility];  Service: Orthopedics;  Laterality: Left;   COLONOSCOPY N/A 03/02/2020   Procedure: COLONOSCOPY;  Surgeon: Donnald Charleston, MD;  Location: [facility]COPY;  Service: Endoscopy;  Laterality: N/A;   COLONOSCOPY WITH PROPOFOL  N/A 04/28/2014   Procedure: COLONOSCOPY WITH PROPOFOL ;  Surgeon: Charleston Donnald GAILS, MD;  Location: [facility] ENDOSCOPY;  Service: Endoscopy;  Laterality: N/A;   DG TOES*L*  2/10   rt   DILATION AND CURETTAGE OF UTERUS     ENTEROSCOPY N/A 10/17/2017   Procedure: ENTEROSCOPY;  Surgeon: Elicia Claw, MD;  [facility]: [facility];  Service: Gastroenterology;  Laterality: N/A;   ESOPHAGOGASTRODUODENOSCOPY N/A 04/10/2014   Procedure: ESOPHAGOGASTRODUODENOSCOPY (EGD);  Surgeon: Jerrell KYM Sol, MD;  Location: [facility] ENDOSCOPY;  Service: Endoscopy;  Laterality: N/A;   ESOPHAGOGASTRODUODENOSCOPY N/A 05/10/2017   Procedure: ESOPHAGOGASTRODUODENOSCOPY (EGD);  Surgeon: Donnald Charleston, MD;  Location: [facility] ENDOSCOPY;  Service: Endoscopy;  Laterality: N/A;   ESOPHAGOGASTRODUODENOSCOPY N/A 09/22/2017   Procedure: ESOPHAGOGASTRODUODENOSCOPY (EGD);  Surgeon: Rosalie Kitchens, MD;  Location: [facility] ENDOSCOPY;  Service: Endoscopy;  Laterality: N/A;  bedside   ESOPHAGOGASTRODUODENOSCOPY N/A 03/02/2020   Procedure: ESOPHAGOGASTRODUODENOSCOPY (EGD);  Surgeon: Donnald Charleston, MD;  Location: [facility];  Service: Endoscopy;  Laterality: N/A;   ESOPHAGOGASTRODUODENOSCOPY  N/A 11/03/2020   Procedure: ESOPHAGOGASTRODUODENOSCOPY (EGD);  Surgeon: Dianna Specking, MD;  Location: [facility] ENDOSCOPY;  Service: Endoscopy;  Laterality: N/A;   ESOPHAGOGASTRODUODENOSCOPY N/A 04/12/2022    Procedure: ESOPHAGOGASTRODUODENOSCOPY (EGD);  Surgeon: Rosalie Kitchens, MD;  Location: [facility];  Service: Gastroenterology;  Laterality: N/A;   ESOPHAGOGASTRODUODENOSCOPY N/A 05/27/2022   Procedure: ESOPHAGOGASTRODUODENOSCOPY (EGD);  Surgeon: Burnette Fallow, MD;  Location: [facility];  Service: Gastroenterology;  Laterality: N/A;   ESOPHAGOGASTRODUODENOSCOPY N/A 09/27/2022   Procedure: ESOPHAGOGASTRODUODENOSCOPY (EGD);  Surgeon: Kriss Estefana DEL, DO;  Location: [facility] ENDOSCOPY;  Service: Gastroenterology;  Laterality: N/A;   ESOPHAGOGASTRODUODENOSCOPY (EGD) WITH PROPOFOL  N/A 04/27/2014   Procedure: ESOPHAGOGASTRODUODENOSCOPY (EGD) WITH PROPOFOL ;  Surgeon: Lamar Donnald GAILS, MD;  Location: [facility] ENDOSCOPY;  Service: Endoscopy;  Laterality: N/A;  possible apc   ESOPHAGOGASTRODUODENOSCOPY (EGD) WITH PROPOFOL  N/A 09/30/2017   Procedure: ESOPHAGOGASTRODUODENOSCOPY (EGD) WITH PROPOFOL ;  Surgeon: Saintclair Jasper, MD;  Location: [facility] ENDOSCOPY;  Service: Gastroenterology;  Laterality: N/A;   ESOPHAGOGASTRODUODENOSCOPY (EGD) WITH PROPOFOL  N/A 10/01/2017   Procedure: ESOPHAGOGASTRODUODENOSCOPY (EGD) WITH PROPOFOL ;  Surgeon: Saintclair Jasper, MD;  Location: [facility] ENDOSCOPY;  Service: Gastroenterology;  Laterality: N/A;   ESOPHAGOGASTRODUODENOSCOPY (EGD) WITH PROPOFOL  N/A 10/08/2017   Procedure: ESOPHAGOGASTRODUODENOSCOPY (EGD) WITH PROPOFOL ;  Surgeon: Elicia Claw, MD;  [facility];  Service: Gastroenterology;  Laterality: N/A;   ESOPHAGOGASTRODUODENOSCOPY (EGD) WITH PROPOFOL  N/A 10/17/2017   Procedure: ESOPHAGOGASTRODUODENOSCOPY (EGD) WITH PROPOFOL ;  Surgeon: Elicia Claw, MD;  [facility];  Service: Gastroenterology;  Laterality: N/A;   ESOPHAGOGASTRODUODENOSCOPY (EGD) WITH PROPOFOL  N/A 10/19/2017   Procedure: ESOPHAGOGASTRODUODENOSCOPY (EGD) WITH PROPOFOL ;  Surgeon: Elicia Claw, MD;  [facility];  Service: Gastroenterology;  Laterality: N/A;   ESOPHAGOGASTRODUODENOSCOPY (EGD) WITH  PROPOFOL  N/A 12/04/2018   Procedure: ESOPHAGOGASTRODUODENOSCOPY (EGD) WITH PROPOFOL ;  Surgeon: Dianna Specking, MD;  Location: [facility];  Service: Endoscopy;  Laterality: N/A;   GIVENS CAPSULE STUDY N/A 10/02/2017   Procedure: GIVENS CAPSULE STUDY;  Surgeon: Saintclair Jasper, MD;  Location: [facility] ENDOSCOPY;  Service: Gastroenterology;  Laterality: N/A;   GIVENS CAPSULE STUDY N/A 10/08/2017   Procedure: GIVENS CAPSULE STUDY;  Surgeon: Elicia Claw, MD;  [facility];  Service: Gastroenterology;  Laterality: N/A;  endoscopic placement of capsule   GIVENS CAPSULE STUDY N/A 03/02/2020   Procedure: GIVENS CAPSULE STUDY;  Surgeon: Donnald Lamar, MD;  Location: [facility];  Service: Endoscopy;  Laterality: N/A;   HARDWARE REMOVAL Right 07/15/2023   Procedure: REMOVAL, HARDWARE;  Surgeon: Sherida Adine BROCKS, MD;  [facility];  Service: Orthopedics;  Laterality: Right;   HEMOSTASIS CLIP PLACEMENT  12/04/2018   Procedure: HEMOSTASIS CLIP PLACEMENT;  Surgeon: Dianna Specking, MD;  Location: [facility];  Service: Endoscopy;;   HEMOSTASIS CLIP PLACEMENT  05/27/2022   Procedure: HEMOSTASIS CLIP PLACEMENT;  Surgeon: Burnette Fallow, MD;  Location: [facility];  Service: Gastroenterology;;   HEMOSTASIS CLIP PLACEMENT  09/27/2022   Procedure: HEMOSTASIS CLIP PLACEMENT;  Surgeon: Kriss Estefana DEL, DO;  Location: [facility] ENDOSCOPY;  Service: Gastroenterology;;   HEMOSTASIS CONTROL  05/27/2022   Procedure: HEMOSTASIS CONTROL;  Surgeon: Burnette Fallow, MD;  Location: [facility];  Service: Gastroenterology;;   HOT HEMOSTASIS N/A 04/27/2014   Procedure: HOT HEMOSTASIS (ARGON PLASMA COAGULATION/BICAP);  Surgeon: Lamar Donnald GAILS, MD;  Location: [facility] ENDOSCOPY;  Service: Endoscopy;  Laterality: N/A;   HOT HEMOSTASIS N/A 09/30/2017   Procedure: HOT HEMOSTASIS (ARGON PLASMA COAGULATION/BICAP);  Surgeon: Saintclair Jasper, MD;  Location: [facility] ENDOSCOPY;  Service: Gastroenterology;  Laterality: N/A;   HOT HEMOSTASIS N/A  10/01/2017   Procedure: HOT HEMOSTASIS (  ARGON PLASMA COAGULATION/BICAP);  Surgeon: Saintclair Jasper, MD;  Location: [facility] ENDOSCOPY;  Service: Gastroenterology;  Laterality: N/A;   HOT HEMOSTASIS N/A 10/17/2017   Procedure: HOT HEMOSTASIS (ARGON PLASMA COAGULATION/BICAP);  Surgeon: Elicia Claw, MD;  Location: [facility] ENDOSCOPY;  Service: Gastroenterology;  Laterality: N/A;   HOT HEMOSTASIS N/A 10/19/2017   Procedure: HOT HEMOSTASIS (ARGON PLASMA COAGULATION/BICAP);  Surgeon: Elicia Claw, MD;  Location: [facility] ENDOSCOPY;  Service: Gastroenterology;  Laterality: N/A;   HOT HEMOSTASIS N/A 03/02/2020   Procedure: HOT HEMOSTASIS (ARGON PLASMA COAGULATION/BICAP);  Surgeon: Donnald Charleston, MD;  Location: [facility];  Service: Endoscopy;  Laterality: N/A;   HOT HEMOSTASIS N/A 04/12/2022   Procedure: HOT HEMOSTASIS (ARGON PLASMA COAGULATION/BICAP);  Surgeon: Rosalie Kitchens, MD;  Location: [facility];  Service: Gastroenterology;  Laterality: N/A;   INCISION AND DRAINAGE OF WOUND Right 07/21/2023   Procedure: IRRIGATION AND DEBRIDEMENT WOUND;  Surgeon: Sherida Adine BROCKS, MD;  [facility];  Service: Orthopedics;  Laterality: Right;  RIGHT ANKLE WOUND I&D   INCISION AND DRAINAGE OF WOUND Right 07/28/2023   Procedure: IRRIGATION AND DEBRIDEMENT WOUND;  Surgeon: Lowery Estefana RAMAN, DO;  Location: [facility];  Service: Plastics;  Laterality: Right;  rrigation and debridement of right ankle wound with placement of myriad and wound vac   IR IMAGING GUIDED PORT INSERTION  07/08/2018   IRRIGATION AND DEBRIDEMENT POSTERIOR HIP Right 07/15/2023   Procedure: IRRIGATION AND DEBRIDEMENT, OPEN FRACTURE;  Surgeon: Sherida Adine BROCKS, MD;  Location: [facility];  Service: Orthopedics;  Laterality: Right;   L shoulder Surgery  2011   ORIF ANKLE FRACTURE Right 04/24/2023   Procedure: OPEN REDUCTION INTERNAL FIXATION (ORIF) ANKLE FRACTURE;  Surgeon: Sherida Adine BROCKS, MD;  [facility]: [facility];  Service: Orthopedics;  Laterality: Right;   POLYPECTOMY   03/02/2020   Procedure: POLYPECTOMY;  Surgeon: Donnald Charleston, MD;  Location: [facility] ENDOS[facility];  Service: Endoscopy;;   SCLEROTHERAPY  11/03/2020   Procedure: MATIAS;  Surgeon: Dianna Specking, MD;  Location: [facility] ENDOSCOPY;  Service: Endoscopy;;   SUBMUCOSAL INJECTION  09/22/2017   Procedure: SUBMUCOSAL INJECTION;  Surgeon: Rosalie Kitchens, MD;  Location: [facility] ENDOSCOPY;  Service: Endoscopy;;   SUBMUCOSAL INJECTION  12/04/2018   Procedure: SUBMUCOSAL INJECTION;  Surgeon: Dianna Specking, MD;  Location: [facility] ENDOSCOPY;  Service: Endoscopy;;    Prior to Admission medications   Medication Sig Start Date End Date Taking? Authorizing Provider  Accu-Chek Softclix Lancets lancets Use to check blood sugar before breakfast and before dinner while on steroids Patient taking differently: 1 each by Other route See admin instructions. Use to check blood sugar before breakfast and before dinner while on steroids 09/27/19  Yes Arminda Daved HERO, MD  acetaminophen  (TYLENOL ) 325 MG tablet Take 2 tablets (650 mg total) by mouth every 6 (six) hours as needed. 05/19/23  Yes Raulkar, Sven SQUIBB, MD  ALPRAZolam  (XANAX ) 0.5 MG tablet Take 0.5 mg by mouth at bedtime.   Yes [provider]  aminocaproic  acid (AMICAR ) 500 MG tablet Take 2 tablets (1,000 mg total) by mouth every 12 (twelve) hours. 02/18/24  Yes Lanny Callander, MD  amLODipine  (NORVASC ) 10 MG tablet Take 1 tablet (10 mg total) by mouth daily. 06/10/23 06/09/24 Yes Gregary Sharper, MD  citalopram  (CELEXA ) 20 MG tablet TAKE 1 TABLET(20 MG) BY MOUTH DAILY Patient taking differently: Take 20 mg by mouth daily as needed (for anxiety). 08/27/23  Yes Nooruddin, Saad, MD  gabapentin  (NEURONTIN ) 100 MG capsule Take 1 capsule (100 mg total) by mouth at bedtime. 07/04/23  Yes Raulkar,  Sven SQUIBB, MD  glucose blood (ACCU-CHEK GUIDE) test strip Check blood sugar 2 times per day while on steroids before breakfast and dinner Patient taking differently: 1 each by Other route See  admin instructions. Check blood sugar 2 times per day while on steroids before breakfast and dinner 09/27/19  Yes Arminda Daved HERO, MD  leptospermum manuka honey (MEDIHONEY) PSTE paste Apply 1 Application topically daily. 12/28/23  Yes Tobie Gaines, DO  lidocaine  4 % Place 1 patch onto the skin daily. Remove & Discard patch within 12 hours or as directed by MD 12/27/23  Yes Tobie Gaines, DO  lisinopril  (ZESTRIL ) 20 MG tablet Take 1 tablet (20 mg total) by mouth daily. 06/23/23  Yes Nooruddin, Saad, MD  pantoprazole  (PROTONIX ) 40 MG tablet TAKE 1 TABLET(40 MG) BY MOUTH TWICE DAILY Patient taking differently: Take 40 mg by mouth 2 (two) times daily as needed (for acid reflux). 09/30/22  Yes Lanny Callander, MD  senna-docusate (SENOKOT-S) 8.6-50 MG tablet Take 1 tablet by mouth at bedtime. 05/19/23  Yes Raulkar, Sven SQUIBB, MD  traMADol  (ULTRAM ) 50 MG tablet Take 50 mg by mouth every 6 (six) hours as needed. for pain   Yes [provider]  traZODone  (DESYREL ) 50 MG tablet TAKE 1 TABLET(50 MG) BY MOUTH AT BEDTIME AS NEEDED FOR SLEEP 11/14/23  Yes Nooruddin, Saad, MD  potassium chloride  SA (KLOR-CON  M) 20 MEQ tablet Take 1 tablet (20 mEq total) by mouth daily. Patient not taking: Reported on 03/04/2024 08/13/23   [name], DO  PROAIR  HFA 108 (90 Base) MCG/ACT inhaler INHALE 1 TO 2 PUFFS INTO THE LUNGS EVERY 6 HOURS AS NEEDED FOR WHEEZING OR SHORTNESS OF BREATH Patient not taking: Reported on 12/24/2023 11/10/20   Lanny Callander, MD    Scheduled Meds:  citalopram   20 mg Oral Daily   docusate sodium   100 mg Oral BID   pantoprazole  (PROTONIX ) IV  40 mg Intravenous Q12H   Continuous Infusions:  sodium chloride  Stopped (03/05/24 0222)   PRN Meds:.acetaminophen , ondansetron  **OR** ondansetron  (ZOFRAN ) IV, traZODone   Allergies as of 03/04/2024 - Review Complete 03/04/2024  Allergen Reaction Noted   Feraheme  [ferumoxytol ] Other (See Comments) 10/09/2017   Nsaids Other (See Comments) 04/25/2014   Wasp venom  Anaphylaxis 10/06/2017   Tomato Hives 01/01/2012   Iron  (ferrous sulfate ) [ferrous sulfate  er] Other (See Comments) 03/07/2020    Family History  Problem Relation Age of Onset   Cancer Mother        Ovarian   Ovarian cancer Mother    Diabetes Mother    Kidney disease Mother    Bleeding Disorder Mother    Diabetes type II Sister    Bleeding Disorder Sister    Diabetes Sister    Colon cancer Maternal Grandfather    Crohn's disease Maternal Grandfather    Stomach cancer Maternal Grandmother    Kidney disease Son    Bleeding Disorder Son    Dysmenorrhea Neg Hx     Social History   Socioeconomic History   Marital status: Widowed    Spouse name: Not on file   Number of children: 3   Years of education: 64   Highest education level: Not on file  Occupational History   Not on file  Tobacco Use   Smoking status: Every Day    Current packs/day: 0.50    Average packs/day: 0.5 packs/day for 30.0 years (15.0 ttl pk-yrs)    Types: Cigarettes   Smokeless tobacco: Former    Types: Snuff  Quit date: 57   Tobacco comments:    1/2 PPD  Vaping Use   Vaping status: Never Used  Substance and Sexual Activity   Alcohol use: No    Alcohol/week: 0.0 standard drinks of alcohol   Drug use: No    Comment: last cocaine-2010   Sexual activity: Not Currently    Comment: Hysterectomy  Other Topics Concern   Not on file  Social History Narrative   Not on file   Social Drivers of Health   Financial Resource Strain: Not on file  Food Insecurity: No Food Insecurity (12/25/2023)   Hunger Vital Sign    Worried About Running Out of Food in the Last Year: Never true    Ran Out of Food in the Last Year: Never true  Transportation Needs: No Transportation Needs (12/25/2023)   PRAPARE - Administrator, Civil Service (Medical): No    Lack of Transportation (Non-Medical): No  Physical Activity: Not on file  Stress: Not on file  Social Connections: Not on file  Intimate Partner  Violence: Not At Risk (12/25/2023)   Humiliation, Afraid, Rape, and Kick questionnaire    Fear of Current or Ex-Partner: No    Emotionally Abused: No    Physically Abused: No    Sexually Abused: No    Review of Systems: All negative except as stated above in HPI.  Physical Exam: Vital signs: Vitals:   03/05/24 0935 03/05/24 0951  BP: (!) 163/78 (!) 156/81  Pulse: 66 74  Resp: 17 19  Temp: 98.3 F (36.8 C) 98.3 F (36.8 C)  SpO2: 97% 100%     General:   Alert,  Well-developed, well-nourished, pleasant and cooperative in NAD Lungs: No visible respiratory distress Heart:  Regular rate and rhythm; no murmurs, clicks, rubs,  or gallops. Abdomen: Soft, nontender, nondistended, bowel sound present, no peritoneal signs Mood and affect normal Alert and oriented x 3 Rectal:  Deferred  GI:  Lab Results: Recent Labs    03/04/24 1440 03/05/24 0452  WBC 13.2* 11.1*  HGB 4.8* 6.3*  HCT 17.5* 20.4*  PLT 311 260   BMET Recent Labs    03/04/24 1440 03/05/24 0452  NA 140 139  K 3.9 3.9  CL 108 109  CO2 19* 21*  GLUCOSE 120* 86  BUN 18 21  CREATININE 0.90 0.73  CALCIUM  8.5* 8.2*   LFT Recent Labs    03/05/24 0452  PROT 5.5*  ALBUMIN 2.7*  AST 11*  ALT 7  ALKPHOS 61  BILITOT 0.7   PT/INR No results for input(s): LABPROT, INR in the last 72 hours.   Studies/Results: DG Chest 2 View Result Date: 03/04/2024 CLINICAL DATA:  Porta cough and fatigue. EXAM: CHEST - 2 VIEW COMPARISON:  Chest radiograph dated 04/18/2023. FINDINGS: Right-sided Port-A-Cath with tip at the cavoatrial junction. No focal consolidation, pleural effusion or pneumothorax. Borderline cardiomegaly. No acute osseous pathology. IMPRESSION: No active cardiopulmonary disease. Electronically Signed   By: Vanetta Chou M.D.   On: 03/04/2024 16:13    Impression/Plan: -Symptomatic anemia with hemoglobin of 4.8 on admission.  Hemoglobin improved to 6.3 with blood transfusion.  Getting another unit  of blood transfusion now. - Recurrent GI bleed with melena.  History of gastric and colonic AVMs in the past.  Recommendations -------------------------- - Recommend EGD tomorrow.  If EGD negative or if does not explain significant drop in hemoglobin then she may need repeat colonoscopy as well as capsule endoscopy to rule out small bowel AVMs. -  Okay to have soft diet today.  Keep n.p.o. past midnight.  Risks (bleeding, infection, bowel perforation that could require surgery, sedation-related changes in cardiopulmonary systems), benefits (identification and possible treatment of source of symptoms, exclusion of certain causes of symptoms), and alternatives (watchful waiting, radiographic imaging studies, empiric medical treatment)  were explained to patient/family in detail and patient wishes to proceed.     LOS: 1 day   Layla Lah  MD, FACP 03/05/2024, 10:27 AM  Contact #  [phone]

## 2024-03-05 NOTE — Progress Notes (Signed)
 Peggy House   DOB:19-Oct-1960   FM#:[id number]      ASSESSMENT & PLAN:  Peggy House is a 63 year old female patient who came to ED due to cough, fatigue, malaise and tiredness for a few days.  Found to have severe anemia related to HHT.  Hematology/Dr. Lanny following.   Hereditary hemorrhagic telangiectasis (HHT)  -- Found to have AVM and PUD in stomach in 2016.  Has required multiple blood transfusions and APCs since 2019.  -- Workup confirmed HHT. -- Status post bevacizumab , IV iron , and transfusions with good response.  -- On Amicar  to help stop bleeding. Patient states that she ran out of medication and has no more at home.   -- Patient has however showed poor compliance with follow up visits. Discussed this with patient today and she states that she has had transportation issues however thinks it is all straightened out now as she has a new transport service that is reliable.  -- Last visit on 02/18/24 in hematology outpatient clinic.  -- Hematology/Dr. Lanny following closely  Recurrent GI bleeding  Epistaxis  History of gastric and colonic AVMs -- Patient with black colored stool.  FOBT positive.   -- Also complains of epistaxis which has now resolved.  -- Continue to monitor CBC closely. -- GI eval done. Plan for EGD tomorrow and may need repeat colonoscopy and capsule endoscopy. -- Continue to monitor CBC closely.  Acute on chronic anemia History of Iron  deficiency anemia -- Arrived 11/20 with severe anemia. Hemoglobin 4.8.  status post multiple units PRBC.  Improved to 6.3 today. -- Recommend PRBC transfusion until hemoglobin >7.0 -- Last ferritin 18 on 02/26/24 -- Continue to monitor CBC with differential  Cough Fatigue Generalized weakness -- Found to have black colored stool -- Continue PRBC transfusions and IV fluids as ordered -- Continue supportive care  History of right ankle fracture -- Noted X-ray 07/17/23 with previous distal tibia and  fibular fractures not definitively seen, likely healed. -- Bandage around right ankle at this time -- Continue supportive care    Code Status Full   Subjective:  Patient seen awake and alert sitting in chair at bedside in ED room.  States that she felt very weak and had nosebleeds with black stool.  States nosebleeds have resolved. Status post blood transfusion and states she feels a little better. States GI team came to see her and plan on procedure which she is in agreement with.  Right ankle bandage intact, patient states she fell previously and fractured her ankle.   Objective:   Intake/Output Summary (Last 24 hours) at 03/05/2024 1357 Last data filed at 03/05/2024 1210 Gross per 24 hour  Intake 745.57 ml  Output --  Net 745.57 ml     PHYSICAL EXAMINATION: ECOG PERFORMANCE STATUS: 2 - Symptomatic, <50% confined to bed  Vitals:   03/05/24 1100 03/05/24 1209  BP: (!) 145/53 (!) 144/91  Pulse: 66 68  Resp: 17 16  Temp:  98.2 F (36.8 C)  SpO2: 100% 100%   Filed Weights   03/04/24 1429  Weight: 234 lb (106.1 kg)    GENERAL: alert, no distress and comfortable +NAD SKIN: skin color, texture, turgor are normal, no rashes or significant lesions EYES: normal, conjunctiva are pink and non-injected, sclera clear OROPHARYNX: no exudate, no erythema and lips, buccal mucosa, and tongue normal  NECK: supple, thyroid normal size, non-tender, without nodularity LYMPH: no palpable lymphadenopathy in the cervical, axillary or inguinal LUNGS: clear to auscultation  and percussion with normal breathing effort HEART: regular rate & rhythm and no murmurs and no lower extremity edema ABDOMEN: abdomen soft, non-tender and normal bowel sounds MUSCULOSKELETAL: no cyanosis of digits and no clubbing  PSYCH: alert & oriented x 3 with fluent speech NEURO: no focal motor/sensory deficits   All questions were answered. The patient knows to call the clinic with any problems, questions or  concerns.   The total time spent in the appointment was 40 minutes encounter with patient including review of chart and various tests results, discussions about plan of care and coordination of care plan  Olam JINNY Brunner, NP 03/05/2024 1:57 PM    Labs Reviewed:  Lab Results  Component Value Date   WBC 11.1 (H) 03/05/2024   HGB 6.3 (LL) 03/05/2024   HCT 20.4 (L) 03/05/2024   MCV 81.3 03/05/2024   PLT 260 03/05/2024   Recent Labs    08/20/23 0000 12/24/23 0800 01/15/24 1037 02/18/24 1300 03/04/24 1440 03/05/24 0452  NA  --    < > 142 142 140 139  K  --    < > 3.8 3.7 3.9 3.9  CL  --    < > 111 110 108 109  CO2  --    < > 25 25 19* 21*  GLUCOSE  --    < > 111* 92 120* 86  BUN  --    < > 11 7* 18 21  CREATININE  --    < > 0.81 0.87 0.90 0.73  CALCIUM   --    < > 9.3 9.1 8.5* 8.2*  GFRNONAA  --    < > >60 >60 >60 >60  GFRAA 98  --   --   --   --   --   PROT  --    < > 7.3 7.0  --  5.5*  ALBUMIN  --    < > 3.9 3.9  --  2.7*  AST  --    < > 10* 10*  --  11*  ALT  --    < > 5 <5  --  7  ALKPHOS  --    < > 76 93  --  61  BILITOT  --    < > 0.2 0.3  --  0.7   < > = values in this interval not displayed.    Studies Reviewed:  DG Chest 2 View Result Date: 03/04/2024 CLINICAL DATA:  Porta cough and fatigue. EXAM: CHEST - 2 VIEW COMPARISON:  Chest radiograph dated 04/18/2023. FINDINGS: Right-sided Port-A-Cath with tip at the cavoatrial junction. No focal consolidation, pleural effusion or pneumothorax. Borderline cardiomegaly. No acute osseous pathology. IMPRESSION: No active cardiopulmonary disease. Electronically Signed   By: Vanetta Chou M.D.   On: 03/04/2024 16:13

## 2024-03-06 ENCOUNTER — Inpatient Hospital Stay (HOSPITAL_COMMUNITY): Admitting: Anesthesiology

## 2024-03-06 ENCOUNTER — Encounter (HOSPITAL_COMMUNITY): Payer: Self-pay | Admitting: Family Medicine

## 2024-03-06 ENCOUNTER — Encounter (HOSPITAL_COMMUNITY): Admission: EM | Disposition: A | Payer: Self-pay | Source: Home / Self Care | Attending: Family Medicine

## 2024-03-06 ENCOUNTER — Inpatient Hospital Stay

## 2024-03-06 DIAGNOSIS — K5520 Angiodysplasia of colon without hemorrhage: Secondary | ICD-10-CM

## 2024-03-06 DIAGNOSIS — K31819 Angiodysplasia of stomach and duodenum without bleeding: Secondary | ICD-10-CM | POA: Diagnosis not present

## 2024-03-06 DIAGNOSIS — F418 Other specified anxiety disorders: Secondary | ICD-10-CM

## 2024-03-06 DIAGNOSIS — I10 Essential (primary) hypertension: Secondary | ICD-10-CM

## 2024-03-06 DIAGNOSIS — K259 Gastric ulcer, unspecified as acute or chronic, without hemorrhage or perforation: Secondary | ICD-10-CM | POA: Diagnosis not present

## 2024-03-06 DIAGNOSIS — K922 Gastrointestinal hemorrhage, unspecified: Secondary | ICD-10-CM | POA: Diagnosis not present

## 2024-03-06 DIAGNOSIS — E119 Type 2 diabetes mellitus without complications: Secondary | ICD-10-CM | POA: Diagnosis not present

## 2024-03-06 DIAGNOSIS — K921 Melena: Secondary | ICD-10-CM

## 2024-03-06 HISTORY — PX: ESOPHAGOGASTRODUODENOSCOPY: SHX5428

## 2024-03-06 LAB — TYPE AND SCREEN
ABO/RH(D): A NEG
Antibody Screen: NEGATIVE
Unit division: 0
Unit division: 0
Unit division: 0

## 2024-03-06 LAB — GLUCOSE, CAPILLARY
Glucose-Capillary: 112 mg/dL — ABNORMAL HIGH (ref 70–99)
Glucose-Capillary: 109 mg/dL — ABNORMAL HIGH (ref 70–99)
Glucose-Capillary: 108 mg/dL — ABNORMAL HIGH (ref 70–99)
Glucose-Capillary: 88 mg/dL (ref 70–99)
Glucose-Capillary: 149 mg/dL — ABNORMAL HIGH (ref 70–99)

## 2024-03-06 LAB — BPAM RBC
Blood Product Expiration Date: 202512042359
Blood Product Expiration Date: 202512052359
Blood Product Expiration Date: 202512082359
ISSUE DATE / TIME: 202511201826
ISSUE DATE / TIME: 202511202258
ISSUE DATE / TIME: 202511210928
Unit Type and Rh: 600
Unit Type and Rh: 600
Unit Type and Rh: 600

## 2024-03-06 LAB — CBC
HCT: 23.3 % — ABNORMAL LOW (ref 36.0–46.0)
Hemoglobin: 7.3 g/dL — ABNORMAL LOW (ref 12.0–15.0)
MCH: 25.3 pg — ABNORMAL LOW (ref 26.0–34.0)
MCHC: 31.3 g/dL (ref 30.0–36.0)
MCV: 80.6 fL (ref 80.0–100.0)
Platelets: 281 K/uL (ref 150–400)
RBC: 2.89 MIL/uL — ABNORMAL LOW (ref 3.87–5.11)
RDW: 20.9 % — ABNORMAL HIGH (ref 11.5–15.5)
WBC: 9.8 K/uL (ref 4.0–10.5)
nRBC: 0 % (ref 0.0–0.2)

## 2024-03-06 LAB — HEMOGLOBIN AND HEMATOCRIT, BLOOD
HCT: 27.3 % — ABNORMAL LOW (ref 36.0–46.0)
Hemoglobin: 8.1 g/dL — ABNORMAL LOW (ref 12.0–15.0)

## 2024-03-06 SURGERY — EGD (ESOPHAGOGASTRODUODENOSCOPY)
Anesthesia: Monitor Anesthesia Care

## 2024-03-06 MED ORDER — SODIUM CHLORIDE 0.9% FLUSH: 10.0000 mL | INTRAVENOUS | Status: DC | PRN

## 2024-03-06 MED ORDER — CHLORHEXIDINE GLUCONATE CLOTH 2 % EX PADS
6.0000 | MEDICATED_PAD | Freq: Every day | CUTANEOUS | Status: DC
  Administered 2024-03-06 – 2024-03-07 (×2): 6 via TOPICAL

## 2024-03-06 MED ORDER — SODIUM CHLORIDE 0.9% FLUSH
10.0000 mL | Freq: Two times a day (BID) | INTRAVENOUS | Status: DC
  Administered 2024-03-06 – 2024-03-07 (×3): 10 mL

## 2024-03-06 MED ORDER — PANTOPRAZOLE SODIUM 40 MG PO TBEC
40.0000 mg | DELAYED_RELEASE_TABLET | Freq: Two times a day (BID) | ORAL | Status: DC
  Administered 2024-03-07: 40 mg via ORAL
  Filled 2024-03-06: qty 1

## 2024-03-06 MED ORDER — PROPOFOL 500 MG/50ML IV EMUL
INTRAVENOUS | Status: DC | PRN
  Administered 2024-03-06: 125 ug/kg/min via INTRAVENOUS

## 2024-03-06 NOTE — Progress Notes (Signed)
 At approximately 2300, patient has a nose bleed.  Pressure held and eventually stopped.  She did soak two washclothes.  Dr. Sundil made aware.  Patient states she has used Afrin Nasal Spray in the past for the nose bleeds.  Dr. Sundil ordered in case another nose bleed occurs. Suzen Ice RN

## 2024-03-06 NOTE — Anesthesia Preprocedure Evaluation (Signed)
 Anesthesia Evaluation  Patient identified by MRN, date of birth, ID band Patient awake    Reviewed: Allergy & Precautions, H&P , NPO status , Patient's Chart, lab work & pertinent test results  History of Anesthesia Complications Negative for: history of anesthetic complications  Airway Mallampati: II  TM Distance: >3 FB Neck ROM: Full    Dental  (+) Partial Lower, Partial Upper   Pulmonary Current Smoker   Pulmonary exam normal breath sounds clear to auscultation       Cardiovascular hypertension, Normal cardiovascular exam Rhythm:Regular Rate:Normal     Neuro/Psych neg Seizures PSYCHIATRIC DISORDERS Anxiety Depression    negative neurological ROS     GI/Hepatic Neg liver ROS,GERD  ,,GI bleed   Endo/Other  diabetes, Type 2    Renal/GU negative Renal ROS  negative genitourinary   Musculoskeletal  (+) Arthritis ,    Abdominal   Peds negative pediatric ROS (+)  Hematology  (+) Blood dyscrasia, anemia Hg 7.3   Anesthesia Other Findings Acute upper GI bleed  Reproductive/Obstetrics negative OB ROS                              Anesthesia Physical Anesthesia Plan  ASA: 3  Anesthesia Plan: MAC   Post-op Pain Management:    Induction: Intravenous  PONV Risk Score and Plan: 1 and Propofol  infusion and Treatment may vary due to age or medical condition  Airway Management Planned: Natural Airway  Additional Equipment:   Intra-op Plan:   Post-operative Plan:   Informed Consent: I have reviewed the patients History and Physical, chart, labs and discussed the procedure including the risks, benefits and alternatives for the proposed anesthesia with the patient or authorized representative who has indicated his/her understanding and acceptance.     Dental advisory given  Plan Discussed with: CRNA  Anesthesia Plan Comments: (This 63 y.o. female with PMH significant for essential  hypertension, obesity, major depressive disorder, type 2 diabetes, anxiety disorder, GERD, presented in the ED with complaints of fatigue, malaise and dry cough for few days.  She has noticed large black-colored stools associated with dizziness in the morning yesterday.  Hemoglobin on arrival 4.8, has received 2 units of PRBC hemoglobin improved to 6.8. )         Anesthesia Quick Evaluation

## 2024-03-06 NOTE — Anesthesia Postprocedure Evaluation (Signed)
 Anesthesia Post Note  Patient: CAROLLYN ETCHEVERRY  Procedure(s) Performed: EGD (ESOPHAGOGASTRODUODENOSCOPY)     Patient location during evaluation: PACU Anesthesia Type: MAC Level of consciousness: awake and alert Pain management: pain level controlled Vital Signs Assessment: post-procedure vital signs reviewed and stable Respiratory status: spontaneous breathing, nonlabored ventilation, respiratory function stable and patient connected to nasal cannula oxygen Cardiovascular status: stable and blood pressure returned to baseline Postop Assessment: no apparent nausea or vomiting Anesthetic complications: no   No notable events documented.  Last Vitals:  Vitals:   03/06/24 1355 03/06/24 1400  BP: (!) 128/56 (!) 128/103  Pulse: 65 63  Resp: (!) 23 16  Temp: 36.5 C   SpO2: 97% 98%    Last Pain:  Vitals:   03/06/24 1355  TempSrc: Oral  PainSc: 0-No pain                 Thom JONELLE Peoples

## 2024-03-06 NOTE — Interval H&P Note (Signed)
 History and Physical Interval Note:  No acute events overnight.  Transfused 3 units in total on this admission with H/H 7.3/23 this morning.  Otherwise hemodynamically stable.  Plan to proceed with EGD today for diagnostic and therapeutic intent.  If negative, plan for VCE placement for further small bowel interrogation.  03/06/2024 12:49 PM  Peggy House  has presented today for surgery, with the diagnosis of Melena, recurrent GI bleed.  The various methods of treatment have been discussed with the patient and family. After consideration of risks, benefits and other options for treatment, the patient has consented to  Procedure(s): EGD (ESOPHAGOGASTRODUODENOSCOPY) (N/A) as a surgical intervention.  The patient's history has been reviewed, patient examined, no change in status, stable for surgery.  I have reviewed the patient's chart and labs.  Questions were answered to the patient's satisfaction.     Peggy House

## 2024-03-06 NOTE — Plan of Care (Signed)
  Problem: Clinical Measurements: Goal: Ability to maintain clinical measurements within normal limits will improve Outcome: Progressing   Problem: Activity: Goal: Risk for activity intolerance will decrease Outcome: Progressing   Problem: Coping: Goal: Level of anxiety will decrease Outcome: Progressing   

## 2024-03-06 NOTE — Op Note (Signed)
 [facility] Patient Name: Peggy House Procedure Date : 03/06/2024 MRN: [id number] Attending MD: Sandor Flatter , MD, [id number] Date of Birth: [birth year] CSN: [id number] Age: 63 Admit Type: Inpatient Procedure:                Upper GI endoscopy Indications:              Acute post hemorrhagic anemia, Melena Providers:                Sandor Flatter, MD, Collene Edu, RN, Lorrayne Kitty,                            Technician Referring MD:              Medicines:                Monitored Anesthesia Care Complications:            No immediate complications. Estimated Blood Loss:     Estimated blood loss was minimal. Procedure:                Pre-Anesthesia Assessment:                           - Prior to the procedure, a History and Physical                            was performed, and patient medications and                            allergies were reviewed. The patient's tolerance of                            previous anesthesia was also reviewed. The risks                            and benefits of the procedure and the sedation                            options and risks were discussed with the patient.                            All questions were answered, and informed consent                            was obtained. Prior Anticoagulants: The patient has                            taken no anticoagulant or antiplatelet agents. ASA                            Grade Assessment: III - A patient with severe                            systemic disease. After reviewing the risks and  benefits, the patient was deemed in satisfactory                            condition to undergo the procedure.                           After obtaining informed consent, the endoscope was                            passed under direct vision. Throughout the                            procedure, the patient's blood pressure, pulse, and                             oxygen saturations were monitored continuously. The                            [id number]-[id number] ([id number]) Olympus endoscope was introduced                            through the mouth, and advanced to the third part                            of duodenum. The upper GI endoscopy was                            accomplished without difficulty. The patient                            tolerated the procedure well. Scope In: Scope Out: Findings:      The examined esophagus was normal.      11 small angioectasias with no bleeding were found in the cardia, in the       gastric fundus, in the gastric body and in the gastric antrum.       Coagulation for hemostasis using argon plasma was successful. Estimated       blood loss was minimal.      Two non-bleeding cratered gastric ulcers with pigmented material were       found in the gastric body. The largest lesion was 4 mm in largest       dimension. For hemostasis, two hemostatic clips were successfully placed       (MR conditional). Clip manufacturer: [employer]. There was no       bleeding at the end of the procedure.      Localized mildly erythematous mucosa without active bleeding was found       in the duodenal bulb and in the first portion of the duodenum. This was       intentionally not biopsied given recent bleed with acute blood loss       anemia.      Three small angioectasias without bleeding were found in the duodenal       bulb and in the first portion of the duodenum. Coagulation for       hemostasis using argon plasma was successful. Estimated blood loss was  minimal. Impression:               - Normal esophagus.                           - 11 non-bleeding angioectasias in the stomach.                            Treated with argon plasma coagulation (APC).                           - Non-bleeding gastric ulcers with pigmented                            material. Clips (MR conditional) were placed. Clip                             manufacturer: [employer].                           - Erythematous duodenopathy.                           - Three non-bleeding angioectasias in the duodenum.                            Treated with argon plasma coagulation (APC).                           - No specimens collected. Recommendation:           - Return patient to hospital ward for ongoing care.                           - Full liquid diet today.                           - Continue present medications.                           - Use Protonix  (pantoprazole ) 40 mg PO BID for 6                            weeks to promote mucosal healing after multiple AVM                            treatment sites, then reduce to 40 mg daily for                            continued management of reflux.                           - Continue serial CBC checks with additional blood                            products as needed per protocol. Procedure Code(s):        ---  Professional ---                           [phone], Esophagogastroduodenoscopy, flexible,                            transoral; with control of bleeding, any method Diagnosis Code(s):        --- Professional ---                           K31.819, Angiodysplasia of stomach and duodenum                            without bleeding                           K25.9, Gastric ulcer, unspecified as acute or                            chronic, without hemorrhage or perforation                           K31.89, Other diseases of stomach and duodenum                           D62, Acute posthemorrhagic anemia                           K92.1, Melena (includes Hematochezia) CPT copyright 2022 [employer]. All rights reserved. The codes documented in this report are preliminary and upon coder review may  be revised to meet current compliance requirements. Sandor Flatter, MD 03/06/2024 1:56:16 PM Number of Addenda: 0

## 2024-03-06 NOTE — Progress Notes (Signed)
 PROGRESS NOTE    Peggy House  FMW:[id number] DOB: [birth year] DOA: 03/04/2024 PCP: Nooruddin, Saad, MD   Brief Narrative:  This 63 y.o. female with PMH significant for essential hypertension, obesity, major depressive disorder, type 2 diabetes, anxiety disorder, GERD, presented in the ED with complaints of fatigue, malaise and dry cough for few days.  She has noticed large black-colored stools associated with dizziness in the morning yesterday.  Hemoglobin on arrival 4.8, has received 2 units of PRBC hemoglobin improved to 6.8.  Admitted for further evaluation. GI is consulted and scheduled for EGD today.  Assessment & Plan:   Principal Problem:   Acute upper GI bleeding Active Problems:   HLD (hyperlipidemia)   Essential hypertension   Obesity, Class III, BMI 40-49.9 (morbid obesity) (HCC)   Osteoarthritis, multiple sites   Insomnia   Major depressive disorder, recurrent episode   GAD (generalized anxiety disorder)   Acute blood loss anemia   Tobacco use disorder  Acute upper GI bleed / Melena: Patient presented with fatigue, malaise and found to have black-colored stool in the morning yesterday. Hemoglobin on arrival 4.6.  Baseline hemoglobin between 7 and 8.  FOBT+ She is hemodynamically stable.  She denies further episodes. Status post 3 units PRBC, Hemoglobin improved to 6.8 > 8.1 > 7.3 Continue Protonix  40 mg IV every 12 hours. Continue IV fluid resuscitation. Monitor H&H every 8 hours. GI is consulted, scheduled for EGD today.   Essential hypertension: Continue amlodipine  10 mg daily.  Major depressive disorder: Anxiety disorder: Continue Celexa . Continue Xanax  as needed.   Insomnia: Continue trazodone  as needed.   Obesity: Diet and exercise discussed in detail.   DM 2: Diet controlled.  Patient not on any medications. Obtain hemoglobin A1c   DVT prophylaxis: SCDs Code Status: Full code Family Communication: No family at bedside. Disposition  Plan:    Status is: Inpatient Remains inpatient appropriate because: Admitted for GI evaluation,  scheduled for EGD today.   Consultants:  Gastroenterology  Procedures: Scheduled EGD today.  Antimicrobials: None  Subjective: Patient was seen and examined at bedside.  Overnight events noted. Patient reports doing much better, She denies any further episodes of black stools.   She is status post 3 units PRBC.  Hemoglobin improved to 7.3.  She is scheduled for EGD today.  Objective: Vitals:   03/05/24 2005 03/06/24 0203 03/06/24 0648 03/06/24 0750  BP: (!) 159/73 (!) 120/51 (!) 112/44 129/62  Pulse: 67 (!) 59 70 73  Resp: 16 16 17 18   Temp: 98.2 F (36.8 C) 98.6 F (37 C) 98 F (36.7 C) 98.5 F (36.9 C)  TempSrc: Oral Oral  Oral  SpO2: 100% 97% 98% 97%  Weight:        Intake/Output Summary (Last 24 hours) at 03/06/2024 1048 Last data filed at 03/06/2024 0750 Gross per 24 hour  Intake 1288.92 ml  Output 350 ml  Net 938.92 ml   Filed Weights   03/04/24 1429  Weight: 106.1 kg    Examination:  General exam: Appears calm and comfortable, not in any acute distress. Respiratory system: CTA bilaterally. Respiratory effort normal.  RR 14 Cardiovascular system: S1 & S2 heard, RRR. No JVD, murmurs, rubs, gallops or clicks.  Gastrointestinal system: Abdomen is non distended, soft and non tender. Normal bowel sounds heard. Central nervous system: Alert and oriented x 3. No focal neurological deficits. Extremities: No edema, no cyanosis, no clubbing. Skin: No rashes, lesions or ulcers Psychiatry: Judgement and insight appear normal. Mood &  affect appropriate.    Data Reviewed: I have personally reviewed following labs and imaging studies  CBC: Recent Labs  Lab 03/04/24 1440 03/05/24 0452 03/05/24 1813 03/06/24 0427  WBC 13.2* 11.1*  --  9.8  NEUTROABS 11.6*  --   --   --   HGB 4.8* 6.3* 8.1* 7.3*  HCT 17.5* 20.4* 25.9* 23.3*  MCV 88.4 81.3  --  80.6  PLT 311 260   --  281   Basic Metabolic Panel: Recent Labs  Lab 03/04/24 1440 03/05/24 0452  NA 140 139  K 3.9 3.9  CL 108 109  CO2 19* 21*  GLUCOSE 120* 86  BUN 18 21  CREATININE 0.90 0.73  CALCIUM  8.5* 8.2*  MG  --  1.8  PHOS  --  3.4   GFR: Estimated Creatinine Clearance: 82.4 mL/min (by C-G formula based on SCr of 0.73 mg/dL). Liver Function Tests: Recent Labs  Lab 03/05/24 0452  AST 11*  ALT 7  ALKPHOS 61  BILITOT 0.7  PROT 5.5*  ALBUMIN 2.7*   No results for input(s): LIPASE, AMYLASE in the last 168 hours. No results for input(s): AMMONIA in the last 168 hours. Coagulation Profile: No results for input(s): INR, PROTIME in the last 168 hours. Cardiac Enzymes: No results for input(s): CKTOTAL, CKMB, CKMBINDEX, TROPONINI in the last 168 hours. BNP (last 3 results) No results for input(s): PROBNP in the last 8760 hours. HbA1C: No results for input(s): HGBA1C in the last 72 hours. CBG: Recent Labs  Lab 03/05/24 1622 03/05/24 2109 03/06/24 0731  GLUCAP 100* 113* 112*   Lipid Profile: No results for input(s): CHOL, HDL, LDLCALC, TRIG, CHOLHDL, LDLDIRECT in the last 72 hours. Thyroid Function Tests: No results for input(s): TSH, T4TOTAL, FREET4, T3FREE, THYROIDAB in the last 72 hours. Anemia Panel: No results for input(s): VITAMINB12, FOLATE, FERRITIN, TIBC, IRON , RETICCTPCT in the last 72 hours. Sepsis Labs: No results for input(s): PROCALCITON, LATICACIDVEN in the last 168 hours.  Recent Results (from the past 240 hours)  Resp panel by RT-PCR (RSV, Flu A&B, Covid) Anterior Nasal Swab     Status: None   Collection Time: 03/05/24  4:51 AM   Specimen: Anterior Nasal Swab  Result Value Ref Range Status   SARS Coronavirus 2 by RT PCR NEGATIVE NEGATIVE Final   Influenza A by PCR NEGATIVE NEGATIVE Final   Influenza B by PCR NEGATIVE NEGATIVE Final    Comment: (NOTE) The Xpert Xpress SARS-CoV-2/FLU/RSV plus assay  is intended as an aid in the diagnosis of influenza from Nasopharyngeal swab specimens and should not be used as a sole basis for treatment. Nasal washings and aspirates are unacceptable for Xpert Xpress SARS-CoV-2/FLU/RSV testing.  Fact Sheet for Patients: bloggercourse.com  Fact Sheet for Healthcare Providers: seriousbroker.it  This test is not yet approved or cleared by the [place]  FDA and has been authorized for detection and/or diagnosis of SARS-CoV-2 by FDA under an Emergency Use Authorization (EUA). This EUA will remain in effect (meaning this test can be used) for the duration of the COVID-19 declaration under Section 564(b)(1) of the Act, 21 U.S.C. section 360bbb-3(b)(1), unless the authorization is terminated or revoked.     Resp Syncytial Virus by PCR NEGATIVE NEGATIVE Final    Comment: (NOTE) Fact Sheet for Patients: bloggercourse.com  Fact Sheet for Healthcare Providers: seriousbroker.it  This test is not yet approved or cleared by the [place]  FDA and has been authorized for detection and/or diagnosis of SARS-CoV-2 by FDA under an Emergency  Use Authorization (EUA). This EUA will remain in effect (meaning this test can be used) for the duration of the COVID-19 declaration under Section 564(b)(1) of the Act, 21 U.S.C. section 360bbb-3(b)(1), unless the authorization is terminated or revoked.  Performed at [facility], [address] [place], [place] [address]    Radiology Studies: DG Chest 2 View Result Date: 03/04/2024 CLINICAL DATA:  Porta cough and fatigue. EXAM: CHEST - 2 VIEW COMPARISON:  Chest radiograph dated 04/18/2023. FINDINGS: Right-sided Port-A-Cath with tip at the cavoatrial junction. No focal consolidation, pleural effusion or pneumothorax. Borderline cardiomegaly. No acute osseous pathology. IMPRESSION: No active cardiopulmonary  disease. Electronically Signed   By: Vanetta Chou M.D.   On: 03/04/2024 16:13   Scheduled Meds:  ALPRAZolam   0.5 mg Oral QHS   amLODipine   10 mg Oral Daily   Chlorhexidine  Gluconate Cloth  6 each Topical Daily   citalopram   20 mg Oral Daily   docusate sodium   100 mg Oral BID   oxymetazoline   1 spray Each Nare BID   pantoprazole  (PROTONIX ) IV  40 mg Intravenous Q12H   sodium chloride  flush  10-40 mL Intracatheter Q12H   Continuous Infusions:  sodium chloride  Stopped (03/06/24 1100)     LOS: 2 days    Time spent: 35 mins    Darcel Dawley, MD [facility]   If 7PM-7AM, please contact night-coverage

## 2024-03-06 NOTE — Transfer of Care (Signed)
 Immediate Anesthesia Transfer of Care Note  Patient: Peggy House  Procedure(s) Performed: EGD (ESOPHAGOGASTRODUODENOSCOPY)  Patient Location: PACU  Anesthesia Type:MAC  Level of Consciousness: awake, alert , and oriented  Airway & Oxygen Therapy: Patient Spontanous Breathing and Patient connected to nasal cannula oxygen  Post-op Assessment: Report given to RN and Post -op Vital signs reviewed and stable  Post vital signs: Reviewed and stable  Last Vitals:  Vitals Value Taken Time  BP 128/56 03/06/24 13:52  Temp    Pulse 65 03/06/24 13:54  Resp 23 03/06/24 13:54  SpO2 97 % 03/06/24 13:54  Vitals shown include unfiled device data.  Last Pain:  Vitals:   03/06/24 1305  TempSrc: Temporal  PainSc: 0-No pain      Patients Stated Pain Goal: 0 (03/05/24 2230)  Complications: No notable events documented.

## 2024-03-06 NOTE — Anesthesia Procedure Notes (Signed)
 Procedure Name: MAC Date/Time: 03/06/2024 1:15 PM  Performed by: Evette Ade, CRNAPre-anesthesia Checklist: Patient identified, Emergency Drugs available, Suction available, Patient being monitored and Timeout performed Patient Re-evaluated:Patient Re-evaluated prior to induction Preoxygenation: Pre-oxygenation with 100% oxygen

## 2024-03-07 ENCOUNTER — Encounter (HOSPITAL_COMMUNITY): Payer: Self-pay | Admitting: Gastroenterology

## 2024-03-07 DIAGNOSIS — K922 Gastrointestinal hemorrhage, unspecified: Secondary | ICD-10-CM | POA: Diagnosis not present

## 2024-03-07 LAB — HEMOGLOBIN AND HEMATOCRIT, BLOOD
HCT: 23 % — ABNORMAL LOW (ref 36.0–46.0)
Hemoglobin: 7.1 g/dL — ABNORMAL LOW (ref 12.0–15.0)

## 2024-03-07 LAB — GLUCOSE, CAPILLARY
Glucose-Capillary: 111 mg/dL — ABNORMAL HIGH (ref 70–99)
Glucose-Capillary: 118 mg/dL — ABNORMAL HIGH (ref 70–99)

## 2024-03-07 LAB — PREPARE RBC (CROSSMATCH)

## 2024-03-07 MED ORDER — DOCUSATE SODIUM 100 MG PO CAPS: 100.0000 mg | ORAL_CAPSULE | Freq: Two times a day (BID) | ORAL | 0 refills | Status: DC

## 2024-03-07 MED ORDER — HEPARIN SOD (PORK) LOCK FLUSH 100 UNIT/ML IV SOLN
500.0000 [IU] | INTRAVENOUS | Status: AC | PRN
  Administered 2024-03-07: 500 [IU]

## 2024-03-07 MED ORDER — PANTOPRAZOLE SODIUM 40 MG PO TBEC: 40.0000 mg | DELAYED_RELEASE_TABLET | Freq: Two times a day (BID) | ORAL | 0 refills | Status: AC

## 2024-03-07 MED ORDER — SODIUM CHLORIDE 0.9% IV SOLUTION
Freq: Once | INTRAVENOUS | Status: AC
Start: 2024-03-07 — End: 2024-03-07

## 2024-03-07 NOTE — Progress Notes (Signed)
 One unit of blood transfusion ended. Patient is alerted and oriented and waiting for family to take her home. UV team consulted for port a cath deaccess.

## 2024-03-07 NOTE — Discharge Summary (Signed)
 Physician Discharge Summary  Peggy House FMW:[id number] DOB: Feb 16, 1961 DOA: 03/04/2024  PCP: Nooruddin, Saad, MD  Admit date: 03/04/2024  Discharge date: 03/07/2024  Admitted From: Home  Disposition:  Home  Recommendations for Outpatient Follow-up:  Follow up with PCP in 1-2 weeks. Please obtain BMP/CBC in one week. Advised to follow-up with Gastroenterology as scheduled. Advised to take pantoprazole  40 mg twice daily for 60 days.  Home Health: None Equipment/Devices:None  Discharge Condition: Stable CODE STATUS:Full code Diet recommendation: Heart Healthy   Brief [facility] Course: This 63 y.o. female with PMH significant for essential hypertension, obesity, major depressive disorder, type 2 diabetes, anxiety disorder, GERD, presented in the ED with complaints of fatigue, malaise and dry cough for few days.  She has noticed large black-colored stools associated with dizziness in the morning yesterday.  Hemoglobin on arrival 4.8, has received 2 units of PRBC hemoglobin improved to 6.8.  Admitted for further evaluation. GI is consulted and scheduled for EGD , EGD showed 11 AVMs in the stomach, and 3 more in the duodenum, all treated with APC thermal therapy. There is cratered gastric ulcers, both closed with hemostatic clips. Plan for high-dose PPI to help promote healing from all of the APC sites.  Patient was feeling much better.  Next day her hemoglobin slightly dropped to 7.1 and given 1 unit PRBC before discharge.  Patient is feeling much improved and wants to be discharged.  Discharge Diagnoses:  Principal Problem:   Acute upper GI bleeding Active Problems:   HLD (hyperlipidemia)   Essential hypertension   Obesity, Class III, BMI 40-49.9 (morbid obesity) (HCC)   Osteoarthritis, multiple sites   Insomnia   Major depressive disorder, recurrent episode   GAD (generalized anxiety disorder)   Acute blood loss anemia   Tobacco use disorder   AVM  (arteriovenous malformation) of small bowel, acquired   Melena   Gastric ulcer without hemorrhage or perforation   Gastric AVM  Acute upper GI bleed / Melena: Patient presented with fatigue, malaise and found to have black-colored stool in the morning yesterday. Hemoglobin on arrival 4.6.  Baseline hemoglobin between 7 and 8.  FOBT+ She is hemodynamically stable.  She denies further episodes. Status post 3 units PRBC, Hemoglobin improved to 6.8 > 8.1 > 7.3 Continue Protonix  40 mg IV every 12 hours. Continue IV fluid resuscitation. Monitor H&H every 8 hours. GI is consulted, underwent EGD. EGD showed 11 AVMs in the stomach, and 3 more in the duodenum, all treated with APC thermal therapy. 2 cratered gastric ulcers, both closed with hemostatic clips. Plan for high-dose PPI to help promote healing from all of the APC sites.  Hemoglobin 7.1, discussed with GI. status post 1 more unit PRBC before discharge. Patient being discharged home.  Advised pantoprazole  40 mg every 12h for 2 months.   Essential hypertension: Continue amlodipine  10 mg daily.   Major depressive disorder: Anxiety disorder: Continue Celexa . Continue Xanax  as needed.   Insomnia: Continue trazodone  as needed.   Obesity: Diet and exercise discussed in detail.   DM 2: Diet controlled.  Patient not on any medications. Outpatient follow up.  Discharge Instructions  Discharge Instructions     Call MD for:  difficulty breathing, headache or visual disturbances   Complete by: As directed    Call MD for:  persistant dizziness or light-headedness   Complete by: As directed    Call MD for:  persistant nausea and vomiting   Complete by: As directed    Diet -  low sodium heart healthy   Complete by: As directed    Diet general   Complete by: As directed    Discharge instructions   Complete by: As directed    Advised to follow-up with primary care physician in 1 week. Advised to follow-up with gastroenterology as  scheduled. Advised to take pantoprazole  40 mg twice daily for 60 days. Patient being discharged after 1 unit of blood transfusion.   Increase activity slowly   Complete by: As directed       Allergies as of 03/07/2024       Reactions   Feraheme  [ferumoxytol ] Other (See Comments)   Muscle tetany, encephalopathy, fatigue, nausea (reaction to injection)   Nsaids Other (See Comments)   Stomach bleeding episodes; Tylenol  is OK   Wasp Venom Anaphylaxis   Tomato Hives   Iron  (ferrous Sulfate ) [ferrous Sulfate  Er] Other (See Comments)   Shock        Medication List     STOP taking these medications    potassium chloride  SA 20 MEQ tablet Commonly known as: KLOR-CON  M   ProAir  HFA 108 (90 Base) MCG/ACT inhaler Generic drug: albuterol        TAKE these medications    Accu-Chek Guide test strip Generic drug: glucose blood Check blood sugar 2 times per day while on steroids before breakfast and dinner What changed:  how much to take how to take this when to take this   Accu-Chek Softclix Lancets lancets Use to check blood sugar before breakfast and before dinner while on steroids What changed:  how much to take how to take this when to take this   acetaminophen  325 MG tablet Commonly known as: Tylenol  Take 2 tablets (650 mg total) by mouth every 6 (six) hours as needed.   ALPRAZolam  0.5 MG tablet Commonly known as: XANAX  Take 0.5 mg by mouth at bedtime.   aminocaproic  acid 500 MG tablet Commonly known as: Amicar  Take 2 tablets (1,000 mg total) by mouth every 12 (twelve) hours.   amLODipine  10 MG tablet Commonly known as: NORVASC  Take 1 tablet (10 mg total) by mouth daily.   citalopram  20 MG tablet Commonly known as: CELEXA  TAKE 1 TABLET(20 MG) BY MOUTH DAILY What changed: See the new instructions.   FT Pain Relief Max Strength 4 % Generic drug: lidocaine  Place 1 patch onto the skin daily. Remove & Discard patch within 12 hours or as directed by MD    gabapentin  100 MG capsule Commonly known as: Neurontin  Take 1 capsule (100 mg total) by mouth at bedtime.   leptospermum manuka honey Pste paste Apply 1 Application topically daily.   lisinopril  20 MG tablet Commonly known as: ZESTRIL  Take 1 tablet (20 mg total) by mouth daily.   pantoprazole  40 MG tablet Commonly known as: PROTONIX  Take 1 tablet (40 mg total) by mouth 2 (two) times daily. TAKE 1 TABLET(40 MG) BY MOUTH TWICE DAILY What changed: See the new instructions.   Senna-S 8.6-50 MG tablet Generic drug: senna-docusate Take 1 tablet by mouth at bedtime.   traMADol  50 MG tablet Commonly known as: ULTRAM  Take 50 mg by mouth every 6 (six) hours as needed. for pain   traZODone  50 MG tablet Commonly known as: DESYREL  TAKE 1 TABLET(50 MG) BY MOUTH AT BEDTIME AS NEEDED FOR SLEEP        Follow-up Information     Nooruddin, Saad, MD Follow up in 1 week(s).   Specialty: Internal Medicine Contact information: [address] [place] [address] [phone]         Gastroenterology, Margarete Follow up in 1 week(s).   Contact information: [address] [place] [address] [phone]                Allergies  Allergen Reactions   Feraheme  [Ferumoxytol ] Other (See Comments)    Muscle tetany, encephalopathy, fatigue, nausea (reaction to injection)   Nsaids Other (See Comments)    Stomach bleeding episodes; Tylenol  is OK   Wasp Venom Anaphylaxis   Tomato Hives   Iron  (Ferrous Sulfate ) [Ferrous Sulfate  Er] Other (See Comments)    Shock    Consultations: Gastroenterology   Procedures/Studies: DG Chest 2 View Result Date: 03/04/2024 CLINICAL DATA:  Porta cough and fatigue. EXAM: CHEST - 2 VIEW COMPARISON:  Chest radiograph dated 04/18/2023. FINDINGS: Right-sided Port-A-Cath with tip at the cavoatrial junction. No focal consolidation, pleural effusion or pneumothorax. Borderline cardiomegaly. No acute osseous pathology.  IMPRESSION: No active cardiopulmonary disease. Electronically Signed   By: Vanetta Chou M.D.   On: 03/04/2024 16:13   EGD   Subjective: Patient was seen and examined at bedside.  Overnight events noted. Patient reports doing much better and want to be discharged home.  Denies any further bleeding.  Discharge Exam: Vitals:   03/07/24 1039 03/07/24 1257  BP: 127/60 (!) 128/24  Pulse: 68 75  Resp: 17 18  Temp: 98 F (36.7 C) 98 F (36.7 C)  SpO2: 100% 100%   Vitals:   03/07/24 0735 03/07/24 1023 03/07/24 1039 03/07/24 1257  BP: (!) 130/59 136/62 127/60 (!) 128/24  Pulse: 72 68 68 75  Resp: 16 18 17 18   Temp: 98.5 F (36.9 C) 98.1 F (36.7 C) 98 F (36.7 C) 98 F (36.7 C)  TempSrc: Oral Oral Oral Oral  SpO2: 98%  100% 100%  Weight:        General: Pt is alert, awake, not in acute distress Cardiovascular: RRR, S1/S2 +, no rubs, no gallops Respiratory: CTA bilaterally, no wheezing, no rhonchi Abdominal: Soft, NT, ND, bowel sounds + Extremities: no edema, no cyanosis    The results of significant diagnostics from this hospitalization (including imaging, microbiology, ancillary and laboratory) are listed below for reference.     Microbiology: Recent Results (from the past 240 hours)  Resp panel by RT-PCR (RSV, Flu A&B, Covid) Anterior Nasal Swab     Status: None   Collection Time: 03/05/24  4:51 AM   Specimen: Anterior Nasal Swab  Result Value Ref Range Status   SARS Coronavirus 2 by RT PCR NEGATIVE NEGATIVE Final   Influenza A by PCR NEGATIVE NEGATIVE Final   Influenza B by PCR NEGATIVE NEGATIVE Final    Comment: (NOTE) The Xpert Xpress SARS-CoV-2/FLU/RSV plus assay is intended as an aid in the diagnosis of influenza from Nasopharyngeal swab specimens and should not be used as a sole basis for treatment. Nasal washings and aspirates are unacceptable for Xpert Xpress SARS-CoV-2/FLU/RSV testing.  Fact Sheet for  Patients: bloggercourse.com  Fact Sheet for Healthcare Providers: seriousbroker.it  This test is not yet approved or cleared by the [place]  FDA and has been authorized for detection and/or diagnosis of SARS-CoV-2 by FDA under an Emergency Use Authorization (EUA). This EUA will remain in effect (meaning this test can be used) for the duration of the COVID-19 declaration under Section 564(b)(1) of the Act, 21 U.S.C. section 360bbb-3(b)(1), unless the authorization is terminated or revoked.     Resp Syncytial Virus  by PCR NEGATIVE NEGATIVE Final    Comment: (NOTE) Fact Sheet for Patients: bloggercourse.com  Fact Sheet for Healthcare Providers: seriousbroker.it  This test is not yet approved or cleared by the [place]  FDA and has been authorized for detection and/or diagnosis of SARS-CoV-2 by FDA under an Emergency Use Authorization (EUA). This EUA will remain in effect (meaning this test can be used) for the duration of the COVID-19 declaration under Section 564(b)(1) of the Act, 21 U.S.C. section 360bbb-3(b)(1), unless the authorization is terminated or revoked.  Performed at [facility], [address], [place], [place] [address]      Labs: BNP (last 3 results) No results for input(s): BNP in the last 8760 hours. Basic Metabolic Panel: Recent Labs  Lab 03/04/24 1440 03/05/24 0452  NA 140 139  K 3.9 3.9  CL 108 109  CO2 19* 21*  GLUCOSE 120* 86  BUN 18 21  CREATININE 0.90 0.73  CALCIUM  8.5* 8.2*  MG  --  1.8  PHOS  --  3.4   Liver Function Tests: Recent Labs  Lab 03/05/24 0452  AST 11*  ALT 7  ALKPHOS 61  BILITOT 0.7  PROT 5.5*  ALBUMIN 2.7*   No results for input(s): LIPASE, AMYLASE in the last 168 hours. No results for input(s): AMMONIA in the last 168 hours. CBC: Recent Labs  Lab 03/04/24 1440 03/05/24 0452 03/05/24 1813  03/06/24 0427 03/06/24 1721 03/07/24 0242  WBC 13.2* 11.1*  --  9.8  --   --   NEUTROABS 11.6*  --   --   --   --   --   HGB 4.8* 6.3* 8.1* 7.3* 8.1* 7.1*  HCT 17.5* 20.4* 25.9* 23.3* 27.3* 23.0*  MCV 88.4 81.3  --  80.6  --   --   PLT 311 260  --  281  --   --    Cardiac Enzymes: No results for input(s): CKTOTAL, CKMB, CKMBINDEX, TROPONINI in the last 168 hours. BNP: Invalid input(s): POCBNP CBG: Recent Labs  Lab 03/06/24 1354 03/06/24 1608 03/06/24 2102 03/07/24 0732 03/07/24 1110  GLUCAP 108* 88 149* 111* 118*   D-Dimer No results for input(s): DDIMER in the last 72 hours. Hgb A1c No results for input(s): HGBA1C in the last 72 hours. Lipid Profile No results for input(s): CHOL, HDL, LDLCALC, TRIG, CHOLHDL, LDLDIRECT in the last 72 hours. Thyroid function studies No results for input(s): TSH, T4TOTAL, T3FREE, THYROIDAB in the last 72 hours.  Invalid input(s): FREET3 Anemia work up No results for input(s): VITAMINB12, FOLATE, FERRITIN, TIBC, IRON , RETICCTPCT in the last 72 hours. Urinalysis    Component Value Date/Time   COLORURINE YELLOW 12/24/2023 1721   APPEARANCEUR HAZY (A) 12/24/2023 1721   LABSPEC 1.015 12/24/2023 1721   PHURINE 5.0 12/24/2023 1721   GLUCOSEU NEGATIVE 12/24/2023 1721   HGBUR NEGATIVE 12/24/2023 1721   BILIRUBINUR NEGATIVE 12/24/2023 1721   KETONESUR NEGATIVE 12/24/2023 1721   PROTEINUR 30 (A) 12/24/2023 1721   UROBILINOGEN 1.0 08/24/2013 2121   NITRITE NEGATIVE 12/24/2023 1721   LEUKOCYTESUR NEGATIVE 12/24/2023 1721   Sepsis Labs Recent Labs  Lab 03/04/24 1440 03/05/24 0452 03/06/24 0427  WBC 13.2* 11.1* 9.8   Microbiology Recent Results (from the past 240 hours)  Resp panel by RT-PCR (RSV, Flu A&B, Covid) Anterior Nasal Swab     Status: None   Collection Time: 03/05/24  4:51 AM   Specimen: Anterior Nasal Swab  Result Value Ref Range Status   SARS Coronavirus  2 by RT PCR NEGATIVE  NEGATIVE Final   Influenza A by PCR NEGATIVE NEGATIVE Final   Influenza B by PCR NEGATIVE NEGATIVE Final    Comment: (NOTE) The Xpert Xpress SARS-CoV-2/FLU/RSV plus assay is intended as an aid in the diagnosis of influenza from Nasopharyngeal swab specimens and should not be used as a sole basis for treatment. Nasal washings and aspirates are unacceptable for Xpert Xpress SARS-CoV-2/FLU/RSV testing.  Fact Sheet for Patients: bloggercourse.com  Fact Sheet for Healthcare Providers: seriousbroker.it  This test is not yet approved or cleared by the [place]  FDA and has been authorized for detection and/or diagnosis of SARS-CoV-2 by FDA under an Emergency Use Authorization (EUA). This EUA will remain in effect (meaning this test can be used) for the duration of the COVID-19 declaration under Section 564(b)(1) of the Act, 21 U.S.C. section 360bbb-3(b)(1), unless the authorization is terminated or revoked.     Resp Syncytial Virus by PCR NEGATIVE NEGATIVE Final    Comment: (NOTE) Fact Sheet for Patients: bloggercourse.com  Fact Sheet for Healthcare Providers: seriousbroker.it  This test is not yet approved or cleared by the [place]  FDA and has been authorized for detection and/or diagnosis of SARS-CoV-2 by FDA under an Emergency Use Authorization (EUA). This EUA will remain in effect (meaning this test can be used) for the duration of the COVID-19 declaration under Section 564(b)(1) of the Act, 21 U.S.C. section 360bbb-3(b)(1), unless the authorization is terminated or revoked.  Performed at [facility], [address], [place], [place] [address]      Time coordinating discharge: Over 30 minutes  SIGNED:   Darcel Dawley, MD  [facility] 03/07/2024, 1:40 PM Pager   If 7PM-7AM, please contact night-coverage

## 2024-03-07 NOTE — Progress Notes (Addendum)
 Discharge   Patient expressed verbal understanding of discharge POC.   Patient given time to ask any questions.  Additional education included in AVS.  Alert oriented in good spirits.    Patient is receiving blood products at this time and has Port A Cath in place.  RN updated on AVS completion.   Discharge delayed until all barriers are cleared.

## 2024-03-07 NOTE — Plan of Care (Signed)
  Problem: Health Behavior/Discharge Planning: Goal: Ability to manage health-related needs will improve Outcome: Progressing   Problem: Activity: Goal: Risk for activity intolerance will decrease Outcome: Progressing   Problem: Nutrition: Goal: Adequate nutrition will be maintained Outcome: Progressing   

## 2024-03-07 NOTE — Progress Notes (Signed)
 Blood transfusion initiated. Patient to be discharge post blood transfusion.

## 2024-03-07 NOTE — Progress Notes (Incomplete)
 Patient ID: Peggy House, female   DOB: 1961-03-29, 63 y.o.   MRN: [id number]    Progress Note   Subjective   Day # 3 CC; melena, symptomatic anemia/hemoglobin 4.8 in setting of probable HHS  EGD yesterday-finding of 11 small AVMs with no bleeding in the gastric cardia fundus and body treated with APC, also with 2 nonbleeding cratered gastric ulcers with pigmented material in the gastric body largest was 4 mm treated with 2 hemostatic clips, 3 small AVMs without bleeding found in the duodenal bulb and first portion of the duodenum treated with APC  Labs today-hemoglobin down to 7.1 from 8.1 last p.m.   Objective   Vital signs in last 24 hours: Temp:  [97.7 F (36.5 C)-98.7 F (37.1 C)] 98.5 F (36.9 C) (11/23 0735) Pulse Rate:  [60-76] 72 (11/23 0735) Resp:  [16-23] 16 (11/23 0735) BP: (128-161)/(56-103) 130/59 (11/23 0735) SpO2:  [95 %-100 %] 98 % (11/23 0735) Last BM Date : 03/04/24 General:    Older African-American female in NAD Heart:  Regular rate and rhythm; no murmurs Lungs: Respirations even and unlabored, lungs CTA bilaterally Abdomen:  Soft, nontender and nondistended. Normal bowel sounds. Extremities:  Without edema. Neurologic:  Alert and oriented,  grossly normal neurologically. Psych:  Cooperative. Normal mood and affect.  Intake/Output from previous day: 11/22 0701 - 11/23 0700 In: 605 [P.O.:480; I.V.:125] Out: 750 [Urine:750] Intake/Output this shift: No intake/output data recorded.  Lab Results: Recent Labs    03/04/24 1440 03/05/24 0452 03/05/24 1813 03/06/24 0427 03/06/24 1721 03/07/24 0242  WBC 13.2* 11.1*  --  9.8  --   --   HGB 4.8* 6.3*   < > 7.3* 8.1* 7.1*  HCT 17.5* 20.4*   < > 23.3* 27.3* 23.0*  PLT 311 260  --  281  --   --    < > = values in this interval not displayed.   BMET Recent Labs    03/04/24 1440 03/05/24 0452  NA 140 139  K 3.9 3.9  CL 108 109  CO2 19* 21*  GLUCOSE 120* 86  BUN 18 21  CREATININE 0.90 0.73   CALCIUM  8.5* 8.2*   LFT Recent Labs    03/05/24 0452  PROT 5.5*  ALBUMIN 2.7*  AST 11*  ALT 7  ALKPHOS 61  BILITOT 0.7   PT/INR No results for input(s): LABPROT, INR in the last 72 hours.       Assessment / Plan:         Principal Problem:   Acute upper GI bleeding Active Problems:   HLD (hyperlipidemia)   Essential hypertension   Obesity, Class III, BMI 40-49.9 (morbid obesity) (HCC)   Osteoarthritis, multiple sites   Insomnia   Major depressive disorder, recurrent episode   GAD (generalized anxiety disorder)   Acute blood loss anemia   Tobacco use disorder   AVM (arteriovenous malformation) of small bowel, acquired   Melena   Gastric ulcer without hemorrhage or perforation   Gastric AVM     LOS: 3 days   Tristian Sickinger EsterwoodPA-C  03/07/2024, 8:31 AM

## 2024-03-07 NOTE — Discharge Instructions (Signed)
 Advised to follow-up with gastroenterology as scheduled. Advised to take pantoprazole  40 mg twice daily for 60 days.

## 2024-03-08 ENCOUNTER — Encounter (HOSPITAL_BASED_OUTPATIENT_CLINIC_OR_DEPARTMENT_OTHER): Admitting: General Surgery

## 2024-03-08 DIAGNOSIS — Z9289 Personal history of other medical treatment: Secondary | ICD-10-CM

## 2024-03-08 HISTORY — DX: Personal history of other medical treatment: Z92.89

## 2024-03-08 LAB — TYPE AND SCREEN
ABO/RH(D): A NEG
Antibody Screen: NEGATIVE
Unit division: 0

## 2024-03-08 LAB — BPAM RBC
Blood Product Expiration Date: 202512042359
ISSUE DATE / TIME: 202511231021
Unit Type and Rh: 600

## 2024-03-10 ENCOUNTER — Other Ambulatory Visit: Payer: Self-pay

## 2024-03-10 ENCOUNTER — Inpatient Hospital Stay

## 2024-03-10 ENCOUNTER — Encounter (HOSPITAL_COMMUNITY): Payer: Self-pay | Admitting: Orthopaedic Surgery

## 2024-03-10 ENCOUNTER — Telehealth: Payer: Self-pay

## 2024-03-10 NOTE — Progress Notes (Signed)
 PCP - Dr Burnette Nooruddin  Cardiologist - none Oncology - Dr [name]  Chest x-ray - 03/04/24 EKG - 12/24/23 Stress Test - n/a ECHO - 04/14/23 Cardiac Cath - n/a  ICD Pacemaker/Loop - n/a  Sleep Study -  Yes (01/2016-Negative) CPAP - none  Diabetes Type 2, no meds, does not check blood sugar  [facility]  Instructions:  Patient to follow up with Dr Mattock for Amicar  instructions for surgery..  Aspirin Instructions: n/a  ERAS - clear liquids til 0430 DOS  Anesthesia review: Yes  STOP now taking any Aspirin (unless otherwise instructed by your surgeon), Aleve, Naproxen, Ibuprofen, Motrin, Advil, Goody's, BC's, all herbal medications, fish oil, and all vitamins.   Coronavirus Screening Do you have any of the following symptoms:  Cough yes/no: No Fever (>100.40F)  yes/no: No Runny nose yes/no: No Sore throat yes/no: No Difficulty breathing/shortness of breath  yes/no: No  Have you traveled in the last 14 days and where? yes/no: No  Patient verbalized understanding of instructions that were given via phone.

## 2024-03-10 NOTE — Telephone Encounter (Signed)
 Spoke with pt regarding PF appt today and pt stated she was not coming today because she forgot plus did not setup her ride.  She said she will come Friday for lab at 11:30am because that will give her time to get her transportation arranged.  She's aware that she has an appt for Saturday for blood too if needed.

## 2024-03-12 ENCOUNTER — Other Ambulatory Visit: Payer: Self-pay

## 2024-03-12 ENCOUNTER — Inpatient Hospital Stay

## 2024-03-12 DIAGNOSIS — I780 Hereditary hemorrhagic telangiectasia: Secondary | ICD-10-CM

## 2024-03-12 DIAGNOSIS — D649 Anemia, unspecified: Secondary | ICD-10-CM

## 2024-03-12 DIAGNOSIS — Q2730 Arteriovenous malformation, site unspecified: Secondary | ICD-10-CM | POA: Diagnosis not present

## 2024-03-12 DIAGNOSIS — D500 Iron deficiency anemia secondary to blood loss (chronic): Secondary | ICD-10-CM

## 2024-03-12 LAB — CBC WITH DIFFERENTIAL (CANCER CENTER ONLY)
Abs Immature Granulocytes: 0.04 K/uL (ref 0.00–0.07)
Basophils Absolute: 0.1 K/uL (ref 0.0–0.1)
Basophils Relative: 1 %
Eosinophils Absolute: 0.1 K/uL (ref 0.0–0.5)
Eosinophils Relative: 1 %
HCT: 25.9 % — ABNORMAL LOW (ref 36.0–46.0)
Hemoglobin: 7.9 g/dL — ABNORMAL LOW (ref 12.0–15.0)
Immature Granulocytes: 0 %
Lymphocytes Relative: 9 %
Lymphs Abs: 0.9 K/uL (ref 0.7–4.0)
MCH: 24.9 pg — ABNORMAL LOW (ref 26.0–34.0)
MCHC: 30.5 g/dL (ref 30.0–36.0)
MCV: 81.7 fL (ref 80.0–100.0)
Monocytes Absolute: 0.5 K/uL (ref 0.1–1.0)
Monocytes Relative: 5 %
Neutro Abs: 8.7 K/uL — ABNORMAL HIGH (ref 1.7–7.7)
Neutrophils Relative %: 84 %
Platelet Count: 336 K/uL (ref 150–400)
RBC: 3.17 MIL/uL — ABNORMAL LOW (ref 3.87–5.11)
RDW: 19.2 % — ABNORMAL HIGH (ref 11.5–15.5)
WBC Count: 10.4 K/uL (ref 4.0–10.5)
nRBC: 0 % (ref 0.0–0.2)

## 2024-03-12 LAB — FERRITIN: Ferritin: 13 ng/mL (ref 11–307)

## 2024-03-12 LAB — SAMPLE TO BLOOD BANK

## 2024-03-12 LAB — PREPARE RBC (CROSSMATCH)

## 2024-03-12 NOTE — Progress Notes (Signed)
 Anesthesia Chart Review: Peggy House  Case: [id number] Date/Time: 03/16/24 0715   Procedures:      INCISION AND DRAINAGE OF DEEP ABSCESS, ANKLE (Right)     APPLICATION, WOUND VAC (Right)   Anesthesia type: Choice   Diagnosis: Open wound of right ankle, subsequent encounter [S91.001D]   Pre-op diagnosis: RIGHT LEG SURGICAL WOUND DEHISCENCE WITH CONCERN FOR DEEP INFECTION   [facility] / [facility]   Surgeons: Elsa Lonni SAUNDERS, MD       DISCUSSION: Patient is a 63 year old female scheduled for the above procedure. She has a history of hereditary hemorrhagic teleangiectasia. She sustained a right ankle trimalleolar fracture   History includes smoking, hereditary hemorrhagic teleangiectasia, anemia, DM2, HTN, HLD, GERD, murmur (trivial AR 03/2023 TTE), obesity, snoring.   Treatments included Iron   PRBC Bevacizumab  (a monoclonal antibody against VEGF) Amicar    Patient with history of recurrent upper GI bleed likely from ulcer disease.  Multiple endoscopies in the last few years.  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  Also had previously retained clip in the stomach.   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  Pathology showed hyperplastic polyp.  VS: Ht 5' 4 (1.626 m)   Wt 106.1 kg   BMI 40.15 kg/m  BP Readings from Last 3 Encounters:  03/07/24 (!) 128/24  02/28/24 (!) 153/75  02/21/24 (!) 174/73   Pulse Readings from Last 3 Encounters:  03/07/24 75  02/28/24 77  02/21/24 70     PROVIDERS: Nooruddin, Saad, MD is PCP  - [facility] (by notes, dismissed from [facility]) - In patient cardiology consult by Claudene Pacific, MD in 04/2023 for bradycardia. Meds adjusted.    LABS: {CHL AN LABS REVIEWED:112001::Labs reviewed: Acceptable for surgery.} (all labs ordered are listed, but only abnormal results are displayed)  Labs Reviewed -  No data to display   IMAGES: CXR 03/04/2024: FINDINGS: Right-sided Port-A-Cath with tip at the cavoatrial junction. No focal consolidation, pleural effusion or pneumothorax. Borderline cardiomegaly. No acute osseous pathology. IMPRESSION: No active cardiopulmonary disease.  EKG: 12/24/2023: SR at 68 bpm   CV: Echo 04/14/2023: IMPRESSIONS   1. Left ventricular ejection fraction, by estimation, is 60 to 65%. The  left ventricle has normal function. The left ventricle has no regional  wall motion abnormalities. Left ventricular diastolic parameters are  consistent with Grade II diastolic  dysfunction (pseudonormalization).   2. Right ventricular systolic function is normal. The right ventricular  size is normal.   3. Left atrial size was moderately dilated.   4. The mitral valve is normal in structure. No evidence of mitral valve  regurgitation. No evidence of mitral stenosis.   5. The aortic valve is normal in structure. Aortic valve regurgitation is  trivial. No aortic stenosis is present. Aortic valve mean gradient  measures 12.0 mmHg.   6. The inferior vena cava is normal in size with greater than 50%  respiratory variability, suggesting right atrial pressure of 3 mmHg.   EGD 03/06/2024: IMPRESSION: Normal esophagus. 11 bleeding angiectasias in the stomach. Treated with APC. Non-bleeding gastric ulcers with pigmented material. Clips (MR conditional; [employer]) were placed. Erythematous duodenopathy. Three non-bleeding angiectasias in the duodenum. Treated with APC.   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   no meds, diet controlled, does not check blood sugar    Past Surgical History:  Procedure Laterality Date   ABDOMINAL HYSTERECTOMY     APPLICATION OF WOUND VAC Right 07/28/2023   Procedure: APPLICATION, WOUND VAC;  Surgeon: Lowery Estefana RAMAN, DO;  [facility];  Service: Plastics;  Laterality: Right;   APPLICATION, SKIN SUBSTITUTE Right 07/28/2023   Procedure: APPLICATION, SKIN SUBSTITUTE;  Surgeon: Lowery Estefana RAMAN, DO;  [facility];  Service: Plastics;  Laterality: Right;   CARPAL TUNNEL RELEASE  05/13/2011   Procedure: CARPAL TUNNEL RELEASE;  Surgeon: Eva Elsie Herring, MD;  Location: [facility];  Service: Orthopedics;  Laterality: Left;   COLONOSCOPY N/A 03/02/2020   Procedure: COLONOSCOPY;  Surgeon: Donnald Charleston, MD;  Location[facility];  Service: Endoscopy;  Laterality: N/A;   COLONOSCOPY WITH PROPOFOL  N/A 04/28/2014   Procedure: COLONOSCOPY WITH PROPOFOL ;  Surgeon: Charleston Donnald GAILS, MD;  Location: [facility] ENDOSCOPY;  Service: Endoscopy;  Laterality: N/A;   DG TOES*L*  2/10   rt   DILATION AND CURETTAGE OF UTERUS     ENTEROSCOPY N/A 10/17/2017   Procedure: ENTEROSCOPY;  Surgeon: Elicia Claw, MD;  [facility];  Service: Gastroenterology;  Laterality: N/A;   ESOPHAGOGASTRODUODENOSCOPY N/A 04/10/2014   Procedure: ESOPHAGOGASTRODUODENOSCOPY (EGD);  Surgeon: Jerrell KYM Sol, MD;  Location: [facility] ENDOSCOPY;  Service: Endoscopy;  Laterality: N/A;   ESOPHAGOGASTRODUODENOSCOPY N/A 05/10/2017   Procedure: ESOPHAGOGASTRODUODENOSCOPY (EGD);  Surgeon: Donnald Charleston, MD;  Location: [facility] ENDOSCOPY;  Service: Endoscopy;  Laterality: N/A;   ESOPHAGOGASTRODUODENOSCOPY N/A 09/22/2017   Procedure: ESOPHAGOGASTRODUODENOSCOPY (EGD);  Surgeon: Rosalie Kitchens, MD;  Location: [facility] ENDOSCOPY;  Service: Endoscopy;  Laterality:  N/A;  bedside   ESOPHAGOGASTRODUODENOSCOPY N/A 03/02/2020   Procedure: ESOPHAGOGASTRODUODENOSCOPY (EGD);  Surgeon: Donnald Charleston, MD;  Location: [facility];  Service: Endoscopy;  Laterality: N/A;   ESOPHAGOGASTRODUODENOSCOPY N/A 11/03/2020   Procedure: ESOPHAGOGASTRODUODENOSCOPY (EGD);  Surgeon: Sol Jerrell, MD;  Location: [facility] ENDOSCOPY;  Service: Endoscopy;  Laterality: N/A;   ESOPHAGOGASTRODUODENOSCOPY N/A 04/12/2022   Procedure: ESOPHAGOGASTRODUODENOSCOPY (EGD);  Surgeon: Rosalie Kitchens, MD;  Location: [facility];  Service: Gastroenterology;  Laterality: N/A;   ESOPHAGOGASTRODUODENOSCOPY N/A 05/27/2022   Procedure: ESOPHAGOGASTRODUODENOSCOPY (EGD);  Surgeon: Burnette Elsie, MD;  Location: [facility];  Service: Gastroenterology;  Laterality: N/A;   ESOPHAGOGASTRODUODENOSCOPY N/A 09/27/2022   Procedure: ESOPHAGOGASTRODUODENOSCOPY (EGD);  Surgeon: Kriss Estefana DEL, DO;  Location: [facility] ENDOSCOPY;  Service: Gastroenterology;  Laterality: N/A;   ESOPHAGOGASTRODUODENOSCOPY N/A 03/06/2024   Procedure: EGD (ESOPHAGOGASTRODUODENOSCOPY);  Surgeon: San Sandor GAILS, DO;  Location: [facility] ENDOSCOPY;  Service: Gastroenterology;  Laterality: N/A;   ESOPHAGOGASTRODUODENOSCOPY (EGD) WITH PROPOFOL  N/A 04/27/2014   Procedure: ESOPHAGOGASTRODUODENOSCOPY (EGD) WITH PROPOFOL ;  Surgeon: Charleston Donnald GAILS, MD;  Location: [facility] ENDOSCOPY;  Service: Endoscopy;  Laterality: N/A;  possible apc   ESOPHAGOGASTRODUODENOSCOPY (EGD) WITH PROPOFOL  N/A 09/30/2017   Procedure: ESOPHAGOGASTRODUODENOSCOPY (EGD) WITH PROPOFOL ;  Surgeon: Saintclair Jasper, MD;  Location: [facility] ENDOSCOPY;  Service: Gastroenterology;  Laterality: N/A;   ESOPHAGOGASTRODUODENOSCOPY (EGD) WITH PROPOFOL  N/A 10/01/2017  Procedure: ESOPHAGOGASTRODUODENOSCOPY (EGD) WITH PROPOFOL ;  Surgeon: Saintclair Jasper, MD;  Location: [facility] ENDOSCOPY;  Service: Gastroenterology;  Laterality: N/A;   ESOPHAGOGASTRODUODENOSCOPY (EGD) WITH PROPOFOL  N/A 10/08/2017   Procedure:  ESOPHAGOGASTRODUODENOSCOPY (EGD) WITH PROPOFOL ;  Surgeon: Elicia Claw, MD;  [facility];  Service: Gastroenterology;  Laterality: N/A;   ESOPHAGOGASTRODUODENOSCOPY (EGD) WITH PROPOFOL  N/A 10/17/2017   Procedure: ESOPHAGOGASTRODUODENOSCOPY (EGD) WITH PROPOFOL ;  Surgeon: Elicia Claw, MD;  [facility];  Service: Gastroenterology;  Laterality: N/A;   ESOPHAGOGASTRODUODENOSCOPY (EGD) WITH PROPOFOL  N/A 10/19/2017   Procedure: ESOPHAGOGASTRODUODENOSCOPY (EGD) WITH PROPOFOL ;  Surgeon: Elicia Claw, MD;  [facility];  Service: Gastroenterology;  Laterality: N/A;   ESOPHAGOGASTRODUODENOSCOPY (EGD) WITH PROPOFOL  N/A 12/04/2018   Procedure: ESOPHAGOGASTRODUODENOSCOPY (EGD) WITH PROPOFOL ;  Surgeon: Dianna Specking, MD;  Location[facility] ENDOS[facility];  Service: Endoscopy;  Laterality: N/A;   GIVENS CAPSULE STUDY N/A 10/02/2017   Procedure: GIVENS CAPSULE STUDY;  Surgeon: Saintclair Jasper, MD;  Location: [facility] ENDOSCOPY;  Service: Gastroenterology;  Laterality: N/A;   GIVENS CAPSULE STUDY N/A 10/08/2017   Procedure: GIVENS CAPSULE STUDY;  Surgeon: Elicia Claw, MD;  [facility];  Service: Gastroenterology;  Laterality: N/A;  endoscopic placement of capsule   GIVENS CAPSULE STUDY N/A 03/02/2020   Procedure: GIVENS CAPSULE STUDY;  Surgeon: Donnald Charleston, MD;  [facility];  Service: Endoscopy;  Laterality: N/A;   HARDWARE REMOVAL Right 07/15/2023   Procedure: REMOVAL, HARDWARE;  Surgeon: Sherida Adine BROCKS, MD;  [facility];  Service: Orthopedics;  Laterality: Right;   HEMOSTASIS CLIP PLACEMENT  12/04/2018   Procedure: HEMOSTASIS CLIP PLACEMENT;  Surgeon: Dianna Specking, MD;  Location[facility] END[facility];  Service: Endoscopy;;   HEMOSTASIS CLIP PLACEMENT  05/27/2022   Procedure: HEMOSTASIS CLIP PLACEMENT;  Surgeon: Burnette Fallow, MD;  Location[facility];  Service: Gastroenterology;;   HEMOSTASIS CLIP PLACEMENT  09/27/2022   Procedure: HEMOSTASIS CLIP  PLACEMENT;  Surgeon: Kriss Estefana DEL, DO;  Location: [facility] ENDOSCOPY;  Service: Gastroenterology;;   HEMOSTASIS CONTROL  05/27/2022   Procedure: HEMOSTASIS CONTROL;  Surgeon: Burnette Fallow, MD;  [facility];  Service: Gastroenterology;;   HOT HEMOSTASIS N/A 04/27/2014   Procedure: HOT HEMOSTASIS (ARGON PLASMA COAGULATION/BICAP);  Surgeon: Charleston Donnald GAILS, MD;  Location: [facility] ENDOSCOPY;  Service: Endoscopy;  Laterality: N/A;   HOT HEMOSTASIS N/A 09/30/2017   Procedure: HOT HEMOSTASIS (ARGON PLASMA COAGULATION/BICAP);  Surgeon: Saintclair Jasper, MD;  Location: [facility] ENDOSCOPY;  Service: Gastroenterology;  Laterality: N/A;   HOT HEMOSTASIS N/A 10/01/2017   Procedure: HOT HEMOSTASIS (ARGON PLASMA COAGULATION/BICAP);  Surgeon: Saintclair Jasper, MD;  Location: [facility] ENDOSCOPY;  Service: Gastroenterology;  Laterality: N/A;   HOT HEMOSTASIS N/A 10/17/2017   Procedure: HOT HEMOSTASIS (ARGON PLASMA COAGULATION/BICAP);  Surgeon: Elicia Claw, MD;  Location: [facility] ENDOSCOPY;  Service: Gastroenterology;  Laterality: N/A;   HOT HEMOSTASIS N/A 10/19/2017   Procedure: HOT HEMOSTASIS (ARGON PLASMA COAGULATION/BICAP);  Surgeon: Elicia Claw, MD;  Location: [facility] ENDOSCOPY;  Service: Gastroenterology;  Laterality: N/A;   HOT HEMOSTASIS N/A 03/02/2020   Procedure: HOT HEMOSTASIS (ARGON PLASMA COAGULATION/BICAP);  Surgeon: Donnald Charleston, MD;  Location: [facility];  Service: Endoscopy;  Laterality: N/A;   HOT HEMOSTASIS N/A 04/12/2022   Procedure: HOT HEMOSTASIS (ARGON PLASMA COAGULATION/BICAP);  Surgeon: Rosalie Kitchens, MD;  Location: [facility];  Service: Gastroenterology;  Laterality: N/A;   INCISION AND DRAINAGE OF WOUND Right 07/21/2023   Procedure: IRRIGATION AND DEBRIDEMENT WOUND;  Surgeon: Sherida Adine BROCKS, MD;  [facility];  Service: Orthopedics;  Laterality: Right;  RIGHT ANKLE WOUND I&D  INCISION AND DRAINAGE OF WOUND Right 07/28/2023   Procedure: IRRIGATION AND DEBRIDEMENT WOUND;  Surgeon: Lowery Estefana RAMAN, DO;  [facility];  Service: Plastics;  Laterality: Right;  rrigation and debridement of right ankle wound with placement of myriad and wound vac   IR IMAGING GUIDED PORT INSERTION  07/08/2018   IRRIGATION AND DEBRIDEMENT POSTERIOR HIP Right 07/15/2023   Procedure: IRRIGATION AND DEBRIDEMENT, OPEN FRACTURE;  Surgeon: Sherida Adine BROCKS, MD;  [facility];  Service: Orthopedics;  Laterality: Right;   L shoulder Surgery  2011   ORIF ANKLE FRACTURE Right 04/24/2023   Procedure: OPEN REDUCTION INTERNAL FIXATION (ORIF) ANKLE FRACTURE;  Surgeon: Sherida Adine BROCKS, MD;  [facility];  Service: Orthopedics;  Laterality: Right;   POLYPECTOMY  03/02/2020   Procedure: POLYPECTOMY;  Surgeon: Donnald Charleston, MD;  [facility] END[facility];  Service: Endoscopy;;   SCLEROTHERAPY  11/03/2020   Procedure: MATIAS;  Surgeon: Dianna Specking, MD;  Location: [facility] ENDOSCOPY;  Service: Endoscopy;;   SUBMUCOSAL INJECTION  09/22/2017   Procedure: SUBMUCOSAL INJECTION;  Surgeon: Rosalie Kitchens, MD;  Location: [facility] ENDOSCOPY;  Service: Endoscopy;;   SUBMUCOSAL INJECTION  12/04/2018   Procedure: SUBMUCOSAL INJECTION;  Surgeon: Dianna Specking, MD;  Location: [facility] ENDOSCOPY;  Service: Endoscopy;;    MEDICATIONS: No current facility-administered medications for this encounter.    Accu-Chek Softclix Lancets lancets   acetaminophen  (TYLENOL ) 325 MG tablet   ALPRAZolam  (XANAX ) 0.5 MG tablet   aminocaproic  acid (AMICAR ) 500 MG tablet   amLODipine  (NORVASC ) 10 MG tablet   citalopram  (CELEXA ) 20 MG tablet   gabapentin  (NEURONTIN ) 100 MG capsule   glucose blood (ACCU-CHEK GUIDE) test strip   leptospermum manuka honey (MEDIHONEY) PSTE paste   lidocaine  4 %   lisinopril  (ZESTRIL ) 20 MG tablet   pantoprazole  (PROTONIX ) 40 MG tablet   senna-docusate (SENOKOT-S) 8.6-50 MG tablet   traMADol  (ULTRAM ) 50 MG tablet   traZODone  (DESYREL ) 50 MG tablet

## 2024-03-13 ENCOUNTER — Telehealth: Payer: Self-pay | Admitting: *Deleted

## 2024-03-13 ENCOUNTER — Inpatient Hospital Stay

## 2024-03-13 NOTE — Telephone Encounter (Signed)
 Pt was NO  SHOW  Sat  03/13/24.   Called pt at home and was informed that pt would not be coming today for transfusion.  Pt stated she had contacted the call center to cancel appt.   Pt denied shortness of breath, denied pain.  Stated she was asymptomatic.  Informed pt that Dr. Lanny will be made aware. Instructed pt to go to nearest  ER if pt develops any new symptoms over the weekend.   Pt voiced understanding.

## 2024-03-15 NOTE — Anesthesia Preprocedure Evaluation (Signed)
 Anesthesia Evaluation  Patient identified by MRN, date of birth, ID band Patient awake    Reviewed: Allergy & Precautions, NPO status , Patient's Chart, lab work & pertinent test results  History of Anesthesia Complications Negative for: history of anesthetic complications  Airway Mallampati: III  TM Distance: >3 FB Neck ROM: Full    Dental  (+) Dental Advisory Given   Pulmonary Current Smoker and Patient abstained from smoking.   breath sounds clear to auscultation       Cardiovascular hypertension, Pt. on medications (-) angina  Rhythm:Regular   1. Left ventricular ejection fraction, by estimation, is 60 to 65%. The  left ventricle has normal function. The left ventricle has no regional  wall motion abnormalities. Left ventricular diastolic parameters are  consistent with Grade II diastolic  dysfunction (pseudonormalization).   2. Right ventricular systolic function is normal. The right ventricular  size is normal.   3. Left atrial size was moderately dilated.   4. The mitral valve is normal in structure. No evidence of mitral valve  regurgitation. No evidence of mitral stenosis.   5. The aortic valve is normal in structure. Aortic valve regurgitation is  trivial. No aortic stenosis is present. Aortic valve mean gradient  measures 12.0 mmHg.   6. The inferior vena cava is normal in size with greater than 50%  respiratory variability, suggesting right atrial pressure of 3 mmHg.      Neuro/Psych   Anxiety Depression       GI/Hepatic Neg liver ROS, PUD,GERD  ,,  Endo/Other  diabetes, Type 2, Oral Hypoglycemic Agents    Renal/GU Lab Results      Component                Value               Date                      NA                       140                 07/28/2023                K                        3.8                 07/28/2023                CO2                      25                  07/28/2023                 GLUCOSE                  113 (H)             07/28/2023                BUN                      12                  07/28/2023  CREATININE               0.85                07/28/2023                CALCIUM                   8.3 (L)             07/28/2023                EGFR                     >60                 02/05/2017                GFRNONAA                 >60                 07/28/2023                Musculoskeletal  (+) Arthritis ,    Abdominal   Peds  Hematology  (+) Blood dyscrasia, anemia Lab Results      Component                Value               Date                      WBC                      8.4                 07/28/2023                HGB                      7.7 (L)             07/28/2023                HCT                      26.4 (L)            07/28/2023                MCV                      88.6                07/28/2023                PLT                      443 (H)             07/28/2023              Anesthesia Other Findings   Reproductive/Obstetrics                              Anesthesia Physical Anesthesia Plan  ASA: 3  Anesthesia Plan: General   Post-op Pain Management:    Induction: Intravenous  PONV Risk Score  and Plan: 2 and Ondansetron , Midazolam  and Treatment may vary due to age or medical condition  Airway Management Planned: LMA  Additional Equipment: None  Intra-op Plan:   Post-operative Plan: Extubation in OR  Informed Consent: I have reviewed the patients History and Physical, chart, labs and discussed the procedure including the risks, benefits and alternatives for the proposed anesthesia with the patient or authorized representative who has indicated his/her understanding and acceptance.     Dental advisory given  Plan Discussed with: CRNA  Anesthesia Plan Comments: (PAT note written 03/15/2024 by Matyas Baisley, PA-C.  )         Anesthesia Quick  Evaluation

## 2024-03-16 ENCOUNTER — Ambulatory Visit (HOSPITAL_COMMUNITY): Payer: Self-pay | Admitting: Vascular Surgery

## 2024-03-16 ENCOUNTER — Encounter (HOSPITAL_COMMUNITY)
Admission: RE | Disposition: A | Discharge status: Home / Self Care | Payer: Self-pay | Source: Home / Self Care | Attending: Orthopaedic Surgery

## 2024-03-16 ENCOUNTER — Encounter (HOSPITAL_COMMUNITY): Payer: Self-pay | Admitting: Orthopaedic Surgery

## 2024-03-16 ENCOUNTER — Other Ambulatory Visit: Payer: Self-pay

## 2024-03-16 ENCOUNTER — Other Ambulatory Visit (HOSPITAL_COMMUNITY): Payer: Self-pay

## 2024-03-16 ENCOUNTER — Ambulatory Visit (HOSPITAL_COMMUNITY)
Admission: RE | Admit: 2024-03-16 | Discharge: 2024-03-16 | Disposition: A | Attending: Orthopaedic Surgery | Admitting: Orthopaedic Surgery

## 2024-03-16 DIAGNOSIS — E66813 Obesity, class 3: Secondary | ICD-10-CM | POA: Insufficient documentation

## 2024-03-16 DIAGNOSIS — M8669 Other chronic osteomyelitis, multiple sites: Secondary | ICD-10-CM | POA: Insufficient documentation

## 2024-03-16 DIAGNOSIS — F17210 Nicotine dependence, cigarettes, uncomplicated: Secondary | ICD-10-CM

## 2024-03-16 DIAGNOSIS — Z833 Family history of diabetes mellitus: Secondary | ICD-10-CM | POA: Insufficient documentation

## 2024-03-16 DIAGNOSIS — T8131XA Disruption of external operation (surgical) wound, not elsewhere classified, initial encounter: Secondary | ICD-10-CM | POA: Diagnosis not present

## 2024-03-16 DIAGNOSIS — Z79899 Other long term (current) drug therapy: Secondary | ICD-10-CM | POA: Diagnosis not present

## 2024-03-16 DIAGNOSIS — X58XXXA Exposure to other specified factors, initial encounter: Secondary | ICD-10-CM | POA: Diagnosis not present

## 2024-03-16 DIAGNOSIS — F419 Anxiety disorder, unspecified: Secondary | ICD-10-CM | POA: Insufficient documentation

## 2024-03-16 DIAGNOSIS — E1169 Type 2 diabetes mellitus with other specified complication: Secondary | ICD-10-CM

## 2024-03-16 DIAGNOSIS — Z6841 Body Mass Index (BMI) 40.0 and over, adult: Secondary | ICD-10-CM | POA: Diagnosis not present

## 2024-03-16 DIAGNOSIS — D649 Anemia, unspecified: Secondary | ICD-10-CM | POA: Insufficient documentation

## 2024-03-16 DIAGNOSIS — I10 Essential (primary) hypertension: Secondary | ICD-10-CM | POA: Insufficient documentation

## 2024-03-16 DIAGNOSIS — K279 Peptic ulcer, site unspecified, unspecified as acute or chronic, without hemorrhage or perforation: Secondary | ICD-10-CM | POA: Insufficient documentation

## 2024-03-16 DIAGNOSIS — F32A Depression, unspecified: Secondary | ICD-10-CM | POA: Insufficient documentation

## 2024-03-16 DIAGNOSIS — E119 Type 2 diabetes mellitus without complications: Secondary | ICD-10-CM | POA: Diagnosis not present

## 2024-03-16 DIAGNOSIS — M1990 Unspecified osteoarthritis, unspecified site: Secondary | ICD-10-CM | POA: Diagnosis not present

## 2024-03-16 DIAGNOSIS — S91001D Unspecified open wound, right ankle, subsequent encounter: Secondary | ICD-10-CM | POA: Diagnosis present

## 2024-03-16 DIAGNOSIS — E669 Obesity, unspecified: Secondary | ICD-10-CM | POA: Diagnosis not present

## 2024-03-16 DIAGNOSIS — K219 Gastro-esophageal reflux disease without esophagitis: Secondary | ICD-10-CM | POA: Diagnosis not present

## 2024-03-16 DIAGNOSIS — M86661 Other chronic osteomyelitis, right tibia and fibula: Secondary | ICD-10-CM

## 2024-03-16 DIAGNOSIS — I780 Hereditary hemorrhagic telangiectasia: Secondary | ICD-10-CM

## 2024-03-16 DIAGNOSIS — G8929 Other chronic pain: Secondary | ICD-10-CM

## 2024-03-16 HISTORY — DX: Pneumonia, unspecified organism: J18.9

## 2024-03-16 HISTORY — PX: INCISION AND DRAINAGE OF DEEP ABSCESS, ANKLE: SHX7360

## 2024-03-16 HISTORY — PX: APPLICATION OF WOUND VAC: SHX5189

## 2024-03-16 LAB — TYPE AND SCREEN
ABO/RH(D): A NEG
ABO/RH(D): A NEG
Antibody Screen: NEGATIVE
Antibody Screen: NEGATIVE
Unit division: 0

## 2024-03-16 LAB — CBC
HCT: 27.4 % — ABNORMAL LOW (ref 36.0–46.0)
Hemoglobin: 8 g/dL — ABNORMAL LOW (ref 12.0–15.0)
MCH: 24.8 pg — ABNORMAL LOW (ref 26.0–34.0)
MCHC: 29.2 g/dL — ABNORMAL LOW (ref 30.0–36.0)
MCV: 84.8 fL (ref 80.0–100.0)
Platelets: 366 K/uL (ref 150–400)
RBC: 3.23 MIL/uL — ABNORMAL LOW (ref 3.87–5.11)
RDW: 19 % — ABNORMAL HIGH (ref 11.5–15.5)
WBC: 10.8 K/uL — ABNORMAL HIGH (ref 4.0–10.5)
nRBC: 0 % (ref 0.0–0.2)

## 2024-03-16 LAB — BPAM RBC
Blood Product Expiration Date: 202512182359
Unit Type and Rh: 600

## 2024-03-16 LAB — GLUCOSE, CAPILLARY
Glucose-Capillary: 105 mg/dL — ABNORMAL HIGH (ref 70–99)
Glucose-Capillary: 125 mg/dL — ABNORMAL HIGH (ref 70–99)

## 2024-03-16 SURGERY — INCISION AND DRAINAGE OF DEEP ABSCESS, ANKLE
Anesthesia: General | Site: Leg Lower | Laterality: Right

## 2024-03-16 MED ORDER — AMISULPRIDE (ANTIEMETIC) 5 MG/2ML IV SOLN: 10.0000 mg | Freq: Once | INTRAVENOUS | Status: DC | PRN

## 2024-03-16 MED ORDER — DEXAMETHASONE SOD PHOSPHATE PF 10 MG/ML IJ SOLN
INTRAMUSCULAR | Status: DC | PRN
  Administered 2024-03-16: 5 mg via INTRAVENOUS

## 2024-03-16 MED ORDER — HYDROMORPHONE HCL 1 MG/ML IJ SOLN
INTRAMUSCULAR | Status: AC
  Filled 2024-03-16: qty 1

## 2024-03-16 MED ORDER — CEFAZOLIN SODIUM-DEXTROSE 2-4 GM/100ML-% IV SOLN
2.0000 g | INTRAVENOUS | Status: AC
  Administered 2024-03-16: 2 g via INTRAVENOUS
  Filled 2024-03-16: qty 100

## 2024-03-16 MED ORDER — OXYCODONE HCL 5 MG PO TABS
5.0000 mg | ORAL_TABLET | ORAL | 0 refills | Status: DC | PRN
  Filled 2024-03-16: qty 20, 4d supply, fill #0

## 2024-03-16 MED ORDER — POVIDONE-IODINE 7.5 % EX SOLN: Freq: Once | CUTANEOUS | Status: DC

## 2024-03-16 MED ORDER — FENTANYL CITRATE (PF) 100 MCG/2ML IJ SOLN
INTRAMUSCULAR | Status: AC
  Filled 2024-03-16: qty 2

## 2024-03-16 MED ORDER — SUCCINYLCHOLINE CHLORIDE 200 MG/10ML IV SOSY
PREFILLED_SYRINGE | INTRAVENOUS | Status: AC
  Filled 2024-03-16: qty 10

## 2024-03-16 MED ORDER — INSULIN ASPART 100 UNIT/ML IJ SOLN: 0.0000 [IU] | INTRAMUSCULAR | Status: DC | PRN

## 2024-03-16 MED ORDER — VANCOMYCIN HCL 1000 MG IV SOLR
INTRAVENOUS | Status: AC
  Filled 2024-03-16: qty 20

## 2024-03-16 MED ORDER — LIDOCAINE 2% (20 MG/ML) 5 ML SYRINGE
INTRAMUSCULAR | Status: AC
  Filled 2024-03-16: qty 5

## 2024-03-16 MED ORDER — VANCOMYCIN HCL 1000 MG IV SOLR
INTRAVENOUS | Status: DC | PRN
  Administered 2024-03-16: 1000 mg

## 2024-03-16 MED ORDER — CHLORHEXIDINE GLUCONATE 0.12 % MT SOLN
OROMUCOSAL | Status: AC
  Administered 2024-03-16: 15 mL via OROMUCOSAL
  Filled 2024-03-16: qty 15

## 2024-03-16 MED ORDER — HYDROMORPHONE HCL 1 MG/ML IJ SOLN
0.2500 mg | INTRAMUSCULAR | Status: DC | PRN
  Administered 2024-03-16 (×4): 0.5 mg via INTRAVENOUS

## 2024-03-16 MED ORDER — PROPOFOL 10 MG/ML IV BOLUS
INTRAVENOUS | Status: DC | PRN
  Administered 2024-03-16: 200 mg via INTRAVENOUS

## 2024-03-16 MED ORDER — ROCURONIUM BROMIDE 10 MG/ML (PF) SYRINGE
PREFILLED_SYRINGE | INTRAVENOUS | Status: AC
  Filled 2024-03-16: qty 10

## 2024-03-16 MED ORDER — EPHEDRINE 5 MG/ML INJ
INTRAVENOUS | Status: AC
  Filled 2024-03-16: qty 5

## 2024-03-16 MED ORDER — FENTANYL CITRATE (PF) 250 MCG/5ML IJ SOLN
INTRAMUSCULAR | Status: DC | PRN
  Administered 2024-03-16 (×2): 50 ug via INTRAVENOUS

## 2024-03-16 MED ORDER — HYDROMORPHONE HCL 1 MG/ML IJ SOLN
0.2500 mg | INTRAMUSCULAR | Status: DC | PRN
  Administered 2024-03-16: 0.5 mg via INTRAVENOUS

## 2024-03-16 MED ORDER — PHENYLEPHRINE 80 MCG/ML (10ML) SYRINGE FOR IV PUSH (FOR BLOOD PRESSURE SUPPORT)
PREFILLED_SYRINGE | INTRAVENOUS | Status: AC
  Filled 2024-03-16: qty 10

## 2024-03-16 MED ORDER — ORAL CARE MOUTH RINSE: 15.0000 mL | Freq: Once | OROMUCOSAL | Status: AC

## 2024-03-16 MED ORDER — ONDANSETRON HCL 4 MG/2ML IJ SOLN
INTRAMUSCULAR | Status: DC | PRN
  Administered 2024-03-16: 4 mg via INTRAVENOUS

## 2024-03-16 MED ORDER — HYDROMORPHONE HCL 1 MG/ML IJ SOLN
INTRAMUSCULAR | Status: AC
  Filled 2024-03-16: qty 0.5

## 2024-03-16 MED ORDER — HYDROMORPHONE HCL 1 MG/ML IJ SOLN
INTRAMUSCULAR | Status: DC | PRN
  Administered 2024-03-16: .5 mg via INTRAVENOUS

## 2024-03-16 MED ORDER — 0.9 % SODIUM CHLORIDE (POUR BTL) OPTIME
TOPICAL | Status: DC | PRN
  Administered 2024-03-16: 1000 mL

## 2024-03-16 MED ORDER — ACETAMINOPHEN 10 MG/ML IV SOLN
INTRAVENOUS | Status: AC
  Filled 2024-03-16: qty 100

## 2024-03-16 MED ORDER — CEFADROXIL 500 MG PO CAPS
500.0000 mg | ORAL_CAPSULE | Freq: Two times a day (BID) | ORAL | 0 refills | Status: DC
  Filled 2024-03-16: qty 20, 10d supply, fill #0

## 2024-03-16 MED ORDER — ONDANSETRON HCL 4 MG/2ML IJ SOLN
INTRAMUSCULAR | Status: AC
  Filled 2024-03-16: qty 2

## 2024-03-16 MED ORDER — PROPOFOL 10 MG/ML IV BOLUS
INTRAVENOUS | Status: AC
  Filled 2024-03-16: qty 20

## 2024-03-16 MED ORDER — LIDOCAINE 2% (20 MG/ML) 5 ML SYRINGE
INTRAMUSCULAR | Status: DC | PRN
  Administered 2024-03-16: 60 mg via INTRAVENOUS

## 2024-03-16 MED ORDER — ACETAMINOPHEN 10 MG/ML IV SOLN
1000.0000 mg | Freq: Once | INTRAVENOUS | Status: AC
  Administered 2024-03-16: 1000 mg via INTRAVENOUS

## 2024-03-16 MED ORDER — CHLORHEXIDINE GLUCONATE 0.12 % MT SOLN: 15.0000 mL | Freq: Once | OROMUCOSAL | Status: AC

## 2024-03-16 MED ORDER — LACTATED RINGERS IV SOLN: INTRAVENOUS | Status: DC

## 2024-03-16 MED ORDER — OXYCODONE HCL 5 MG PO TABS: 5.0000 mg | ORAL_TABLET | Freq: Once | ORAL | Status: DC | PRN

## 2024-03-16 MED ORDER — OXYCODONE HCL 5 MG/5ML PO SOLN: 5.0000 mg | Freq: Once | ORAL | Status: DC | PRN

## 2024-03-16 SURGICAL SUPPLY — 41 items
BAG COUNTER SPONGE SURGICOUNT (BAG) IMPLANT
BNDG COHESIVE 4X5 TAN STRL LF (GAUZE/BANDAGES/DRESSINGS) IMPLANT
BNDG COMPR ESMARK 4X3 LF (GAUZE/BANDAGES/DRESSINGS) IMPLANT
BNDG COMPR ESMARK 6X3 LF (GAUZE/BANDAGES/DRESSINGS) IMPLANT
BNDG ELASTIC 4INX 5YD STR LF (GAUZE/BANDAGES/DRESSINGS) IMPLANT
BNDG ELASTIC 6X10 VLCR STRL LF (GAUZE/BANDAGES/DRESSINGS) ×2 IMPLANT
BNDG GAUZE DERMACEA FLUFF 4 (GAUZE/BANDAGES/DRESSINGS) ×2 IMPLANT
COVER SURGICAL LIGHT HANDLE (MISCELLANEOUS) ×4 IMPLANT
CUFF TRNQT CYL 34X4.125X (TOURNIQUET CUFF) ×2 IMPLANT
DRAPE U-SHAPE 47X51 STRL (DRAPES) ×2 IMPLANT
DRESSING PEEL AND PLC PRVNA 13 (GAUZE/BANDAGES/DRESSINGS) IMPLANT
DRSG XEROFORM 1X8 (GAUZE/BANDAGES/DRESSINGS) IMPLANT
DURAPREP 26ML APPLICATOR (WOUND CARE) ×2 IMPLANT
ELECTRODE REM PT RTRN 9FT ADLT (ELECTROSURGICAL) ×2 IMPLANT
GAUZE PAD ABD 8X10 STRL (GAUZE/BANDAGES/DRESSINGS) IMPLANT
GAUZE SPONGE 4X4 12PLY STRL (GAUZE/BANDAGES/DRESSINGS) IMPLANT
GAUZE SPONGE 4X4 12PLY STRL LF (GAUZE/BANDAGES/DRESSINGS) ×2 IMPLANT
GAUZE XEROFORM 1X8 LF (GAUZE/BANDAGES/DRESSINGS) ×2 IMPLANT
GLOVE BIOGEL M STRL SZ7.5 (GLOVE) ×2 IMPLANT
GLOVE INDICATOR 8.0 STRL GRN (GLOVE) ×2 IMPLANT
GLOVE SRG 8 PF TXTR STRL LF DI (GLOVE) ×2 IMPLANT
GOWN STRL REUS W/ TWL LRG LVL3 (GOWN DISPOSABLE) ×2 IMPLANT
GOWN STRL REUS W/ TWL XL LVL3 (GOWN DISPOSABLE) ×2 IMPLANT
KIT BASIN OR (CUSTOM PROCEDURE TRAY) ×2 IMPLANT
KIT DRSG PREVENA PLUS 7DAY 125 (MISCELLANEOUS) IMPLANT
KIT TURNOVER KIT B (KITS) ×2 IMPLANT
MANIFOLD NEPTUNE II (INSTRUMENTS) ×2 IMPLANT
NDL 22X1.5 STRL (OR ONLY) (MISCELLANEOUS) IMPLANT
NEEDLE 22X1.5 STRL (OR ONLY) (MISCELLANEOUS) IMPLANT
PACK ORTHO EXTREMITY (CUSTOM PROCEDURE TRAY) ×2 IMPLANT
PAD ARMBOARD POSITIONER FOAM (MISCELLANEOUS) ×4 IMPLANT
PAD CAST 4YDX4 CTTN HI CHSV (CAST SUPPLIES) ×2 IMPLANT
SET HNDPC FAN SPRY TIP SCT (DISPOSABLE) ×2 IMPLANT
SOLN 0.9% NACL POUR BTL 1000ML (IV SOLUTION) ×2 IMPLANT
SPLINT FIBERGLASS 4X30 (CAST SUPPLIES) IMPLANT
SPONGE T-LAP 18X18 ~~LOC~~+RFID (SPONGE) IMPLANT
STOCKINETTE IMPERVIOUS 9X36 MD (GAUZE/BANDAGES/DRESSINGS) IMPLANT
SUT ETHILON 2 0 FS 18 (SUTURE) IMPLANT
TUBE CONNECTING 12X1/4 (SUCTIONS) ×2 IMPLANT
UNDERPAD 30X36 HEAVY ABSORB (UNDERPADS AND DIAPERS) ×2 IMPLANT
YANKAUER SUCT BULB TIP NO VENT (SUCTIONS) ×2 IMPLANT

## 2024-03-16 NOTE — Anesthesia Preprocedure Evaluation (Addendum)
 Anesthesia Evaluation  Patient identified by MRN, date of birth, ID band Patient awake    Reviewed: Allergy & Precautions, H&P , NPO status , Patient's Chart, lab work & pertinent test results  Airway Mallampati: II  TM Distance: >3 FB Neck ROM: Full    Dental  (+) Dental Advisory Given   Pulmonary neg pulmonary ROS, Current Smoker and Patient abstained from smoking.   Pulmonary exam normal breath sounds clear to auscultation       Cardiovascular hypertension, Pt. on medications negative cardio ROS Normal cardiovascular exam Rhythm:Regular Rate:Normal     Neuro/Psych   Anxiety Depression    negative neurological ROS  negative psych ROS   GI/Hepatic Neg liver ROS, PUD,GERD  ,,  Endo/Other  diabetes, Type 2  Class 3 obesity  Renal/GU negative Renal ROS  negative genitourinary   Musculoskeletal  (+) Arthritis , Osteoarthritis,    Abdominal  (+) + obese  Peds negative pediatric ROS (+)  Hematology  (+) Blood dyscrasia, anemia   Anesthesia Other Findings   Reproductive/Obstetrics negative OB ROS                              Anesthesia Physical Anesthesia Plan  ASA: 3  Anesthesia Plan: General   Post-op Pain Management:    Induction: Intravenous  PONV Risk Score and Plan: 2 and Ondansetron , Midazolam  and Treatment may vary due to age or medical condition  Airway Management Planned: LMA  Additional Equipment:   Intra-op Plan:   Post-operative Plan: Extubation in OR  Informed Consent: I have reviewed the patients History and Physical, chart, labs and discussed the procedure including the risks, benefits and alternatives for the proposed anesthesia with the patient or authorized representative who has indicated his/her understanding and acceptance.     Dental advisory given  Plan Discussed with: CRNA  Anesthesia Plan Comments:         Anesthesia Quick Evaluation

## 2024-03-16 NOTE — Transfer of Care (Signed)
 Immediate Anesthesia Transfer of Care Note  Patient: Peggy House  Procedure(s) Performed: INCISION AND DRAINAGE OF DEEP ABSCESS, ANKLE (Right: Leg Lower) APPLICATION, WOUND VAC (Right: Leg Lower)  Patient Location: PACU  Anesthesia Type:General  Level of Consciousness: awake, alert , and oriented  Airway & Oxygen Therapy: Patient Spontanous Breathing and Patient connected to nasal cannula oxygen  Post-op Assessment: Report given to RN and Post -op Vital signs reviewed and stable  Post vital signs: Reviewed and stable  Last Vitals:  Vitals Value Taken Time  BP 169/75 03/16/24 08:54  Temp    Pulse 80 03/16/24 08:58  Resp 13 03/16/24 08:58  SpO2 97 % 03/16/24 08:58  Vitals shown include unfiled device data.  Last Pain:  Vitals:   03/16/24 0649  TempSrc:   PainSc: 9          Complications: No notable events documented.

## 2024-03-16 NOTE — Anesthesia Postprocedure Evaluation (Signed)
 Anesthesia Post Note  Patient: Peggy House  Procedure(s) Performed: INCISION AND DRAINAGE OF DEEP ABSCESS, ANKLE (Right: Leg Lower) APPLICATION, WOUND VAC (Right: Leg Lower)     Patient location during evaluation: PACU Anesthesia Type: General Level of consciousness: awake and alert Pain management: pain level controlled Vital Signs Assessment: post-procedure vital signs reviewed and stable Respiratory status: spontaneous breathing, nonlabored ventilation and respiratory function stable Cardiovascular status: blood pressure returned to baseline and stable Postop Assessment: no apparent nausea or vomiting Anesthetic complications: no   No notable events documented.  Last Vitals:  Vitals:   03/16/24 0945 03/16/24 1000  BP: (!) 154/82 (!) 155/82  Pulse: 74 77  Resp: 13 13  Temp:  36.7 C  SpO2: 92% 95%    Last Pain:  Vitals:   03/16/24 0945  TempSrc:   PainSc: 8                  Butler Levander Pinal

## 2024-03-16 NOTE — H&P (Signed)
 PREOPERATIVE H&P  Chief Complaint: Right ankle and leg infection  HPI: Peggy House is a 63 y.o. female who presents for preoperative history and physical with a diagnosis of status post open treatment of right ankle fracture.  She developed some skin breakdown and issues with the hardware.  She subsequently had hardware removed by Dr. Sherida but then developed issues with her wound.  Last time we saw her in the clinic she had some foul odor and concern for deep infection.  MRI scan demonstrated some edema within the fibula and tibia and she is here today for surgery.. Symptoms are rated as moderate to severe, and have been worsening.  This is significantly impairing activities of daily living.  She has elected for surgical management.   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   no meds, diet controlled, does not check blood sugar   Past Surgical History:  Procedure Laterality Date   ABDOMINAL HYSTERECTOMY     APPLICATION OF WOUND VAC Right 07/28/2023   Procedure: APPLICATION, WOUND VAC;  Surgeon: Lowery Estefana RAMAN, DO;  [facility]: [facility];  Service: Plastics;  Laterality: Right;   APPLICATION, SKIN SUBSTITUTE Right 07/28/2023   Procedure: APPLICATION, SKIN SUBSTITUTE;  Surgeon: Lowery Estefana RAMAN, DO;  [facility]: [facility];  Service: Plastics;  Laterality: Right;   CARPAL TUNNEL RELEASE  05/13/2011   Procedure: CARPAL TUNNEL RELEASE;  Surgeon: Eva Elsie Herring, MD;  Location: [facility];  Service: Orthopedics;  Laterality: Left;   COLONOSCOPY N/A 03/02/2020   Procedure: COLONOSCOPY;  Surgeon: Donnald Charleston, MD;  Location: [facility];  Service: Endoscopy;  Laterality: N/A;   COLONOSCOPY WITH PROPOFOL  N/A 04/28/2014   Procedure: COLONOSCOPY WITH PROPOFOL ;  Surgeon: Charleston Donnald GAILS, MD;  Location: [facility] ENDOSCOPY;  Service: Endoscopy;  Laterality: N/A;   DG TOES*L*  2/10   rt   DILATION AND CURETTAGE OF UTERUS     ENTEROSCOPY N/A 10/17/2017   Procedure: ENTEROSCOPY;  Surgeon: Elicia Claw, MD;  [facility]: [facility];  Service: Gastroenterology;  Laterality: N/A;   ESOPHAGOGASTRODUODENOSCOPY N/A 04/10/2014   Procedure: ESOPHAGOGASTRODUODENOSCOPY (EGD);  Surgeon: Jerrell KYM Sol, MD;  Location: [facility] ENDOSCOPY;  Service: Endoscopy;  Laterality: N/A;   ESOPHAGOGASTRODUODENOSCOPY N/A 05/10/2017   Procedure: ESOPHAGOGASTRODUODENOSCOPY (EGD);  Surgeon: Donnald Charleston, MD;  Location: [facility] ENDOSCOPY;  Service: Endoscopy;  Laterality: N/A;   ESOPHAGOGASTRODUODENOSCOPY N/A 09/22/2017   Procedure: ESOPHAGOGASTRODUODENOSCOPY (EGD);  Surgeon: Rosalie Kitchens, MD;  Location: [facility] ENDOSCOPY;  Service: Endoscopy;  Laterality: N/A;  bedside   ESOPHAGOGASTRODUODENOSCOPY N/A 03/02/2020   Procedure: ESOPHAGOGASTRODUODENOSCOPY (EGD);  Surgeon: Donnald Charleston, MD;  Location: [facility];  Service: Endoscopy;  Laterality: N/A;   ESOPHAGOGASTRODUODENOSCOPY N/A 11/03/2020   Procedure: ESOPHAGOGASTRODUODENOSCOPY (EGD);  Surgeon: Sol Jerrell, MD;  Location: [facility] ENDOSCOPY;  Service: Endoscopy;  Laterality: N/A;   ESOPHAGOGASTRODUODENOSCOPY N/A 04/12/2022   Procedure: ESOPHAGOGASTRODUODENOSCOPY (EGD);  Surgeon: Rosalie Kitchens, MD;  Location: [facility];  Service: Gastroenterology;  Laterality: N/A;   ESOPHAGOGASTRODUODENOSCOPY N/A 05/27/2022   Procedure: ESOPHAGOGASTRODUODENOSCOPY (EGD);  Surgeon: Burnette Fallow, MD;  Location: [facility];  Service: Gastroenterology;  Laterality: N/A;    ESOPHAGOGASTRODUODENOSCOPY N/A 09/27/2022   Procedure: ESOPHAGOGASTRODUODENOSCOPY (EGD);  Surgeon: Kriss Estefana DEL, DO;  Location: [facility] ENDOSCOPY;  Service: Gastroenterology;  Laterality: N/A;   ESOPHAGOGASTRODUODENOSCOPY N/A 03/06/2024   Procedure: EGD (ESOPHAGOGASTRODUODENOSCOPY);  Surgeon: San Sandor GAILS, DO;  Location: [facility] ENDOSCOPY;  Service: Gastroenterology;  Laterality: N/A;   ESOPHAGOGASTRODUODENOSCOPY (EGD) WITH PROPOFOL  N/A 04/27/2014   Procedure: ESOPHAGOGASTRODUODENOSCOPY (EGD) WITH PROPOFOL ;  Surgeon: Lamar Donnald GAILS, MD;  Location: [facility] ENDOSCOPY;  Service: Endoscopy;  Laterality: N/A;  possible apc   ESOPHAGOGASTRODUODENOSCOPY (EGD) WITH PROPOFOL  N/A 09/30/2017   Procedure: ESOPHAGOGASTRODUODENOSCOPY (EGD) WITH PROPOFOL ;  Surgeon: Saintclair Jasper, MD;  Location: [facility] ENDOSCOPY;  Service: Gastroenterology;  Laterality: N/A;   ESOPHAGOGASTRODUODENOSCOPY (EGD) WITH PROPOFOL  N/A 10/01/2017   Procedure: ESOPHAGOGASTRODUODENOSCOPY (EGD) WITH PROPOFOL ;  Surgeon: Saintclair Jasper, MD;  Location: [facility] ENDOSCOPY;  Service: Gastroenterology;  Laterality: N/A;   ESOPHAGOGASTRODUODENOSCOPY (EGD) WITH PROPOFOL  N/A 10/08/2017   Procedure: ESOPHAGOGASTRODUODENOSCOPY (EGD) WITH PROPOFOL ;  Surgeon: Elicia Claw, MD;  [facility];  Service: Gastroenterology;  Laterality: N/A;   ESOPHAGOGASTRODUODENOSCOPY (EGD) WITH PROPOFOL  N/A 10/17/2017   Procedure: ESOPHAGOGASTRODUODENOSCOPY (EGD) WITH PROPOFOL ;  Surgeon: Elicia Claw, MD;  [facility];  Service: Gastroenterology;  Laterality: N/A;   ESOPHAGOGASTRODUODENOSCOPY (EGD) WITH PROPOFOL  N/A 10/19/2017   Procedure: ESOPHAGOGASTRODUODENOSCOPY (EGD) WITH PROPOFOL ;  Surgeon: Elicia Claw, MD;  [facility];  Service: Gastroenterology;  Laterality: N/A;   ESOPHAGOGASTRODUODENOSCOPY (EGD) WITH PROPOFOL  N/A 12/04/2018   Procedure: ESOPHAGOGASTRODUODENOSCOPY (EGD) WITH PROPOFOL ;  Surgeon: Dianna Specking, MD;  Location: [facility];   Service: Endoscopy;  Laterality: N/A;   GIVENS CAPSULE STUDY N/A 10/02/2017   Procedure: GIVENS CAPSULE STUDY;  Surgeon: Saintclair Jasper, MD;  Location: [facility] ENDOSCOPY;  Service: Gastroenterology;  Laterality: N/A;   GIVENS CAPSULE STUDY N/A 10/08/2017   Procedure: GIVENS CAPSULE STUDY;  Surgeon: Elicia Claw, MD;  [facility];  Service: Gastroenterology;  Laterality: N/A;  endoscopic placement of capsule   GIVENS CAPSULE STUDY N/A 03/02/2020   Procedure: GIVENS CAPSULE STUDY;  Surgeon: Donnald Lamar, MD;  Location: [facility];  Service: Endoscopy;  Laterality: N/A;   HARDWARE REMOVAL Right 07/15/2023   Procedure: REMOVAL, HARDWARE;  Surgeon: Sherida Adine BROCKS, MD;  [facility];  Service: Orthopedics;  Laterality: Right;   HEMOSTASIS CLIP PLACEMENT  12/04/2018   Procedure: HEMOSTASIS CLIP PLACEMENT;  Surgeon: Dianna Specking, MD;  Location: [facility];  Service: Endoscopy;;   HEMOSTASIS CLIP PLACEMENT  05/27/2022   Procedure: HEMOSTASIS CLIP PLACEMENT;  Surgeon: Burnette Fallow, MD;  Location: [facility];  Service: Gastroenterology;;   HEMOSTASIS CLIP PLACEMENT  09/27/2022   Procedure: HEMOSTASIS CLIP PLACEMENT;  Surgeon: Kriss Estefana DEL, DO;  Location: [facility] ENDOSCOPY;  Service: Gastroenterology;;   HEMOSTASIS CONTROL  05/27/2022   Procedure: HEMOSTASIS CONTROL;  Surgeon: Burnette Fallow, MD;  Location: [facility];  Service: Gastroenterology;;   HOT HEMOSTASIS N/A 04/27/2014   Procedure: HOT HEMOSTASIS (ARGON PLASMA COAGULATION/BICAP);  Surgeon: Lamar Donnald GAILS, MD;  Location: [facility] ENDOSCOPY;  Service: Endoscopy;  Laterality: N/A;   HOT HEMOSTASIS N/A 09/30/2017   Procedure: HOT HEMOSTASIS (ARGON PLASMA COAGULATION/BICAP);  Surgeon: Saintclair Jasper, MD;  Location: [facility] ENDOSCOPY;  Service: Gastroenterology;  Laterality: N/A;   HOT HEMOSTASIS N/A 10/01/2017   Procedure: HOT HEMOSTASIS (ARGON PLASMA COAGULATION/BICAP);  Surgeon: Saintclair Jasper, MD;  Location: [facility] ENDOSCOPY;  Service:  Gastroenterology;  Laterality: N/A;  HOT HEMOSTASIS N/A 10/17/2017   Procedure: HOT HEMOSTASIS (ARGON PLASMA COAGULATION/BICAP);  Surgeon: Elicia Claw, MD;  Location: [facility] ENDOSCOPY;  Service: Gastroenterology;  Laterality: N/A;   HOT HEMOSTASIS N/A 10/19/2017   Procedure: HOT HEMOSTASIS (ARGON PLASMA COAGULATION/BICAP);  Surgeon: Elicia Claw, MD;  Location: [facility] ENDOSCOPY;  Service: Gastroenterology;  Laterality: N/A;   HOT HEMOSTASIS N/A 03/02/2020   Procedure: HOT HEMOSTASIS (ARGON PLASMA COAGULATION/BICAP);  Surgeon: Donnald Charleston, MD;  Location: [facility];  Service: Endoscopy;  Laterality: N/A;   HOT HEMOSTASIS N/A 04/12/2022   Procedure: HOT HEMOSTASIS (ARGON PLASMA COAGULATION/BICAP);  Surgeon: Rosalie Kitchens, MD;  Location: [facility];  Service: Gastroenterology;  Laterality: N/A;   INCISION AND DRAINAGE OF WOUND Right 07/21/2023   Procedure: IRRIGATION AND DEBRIDEMENT WOUND;  Surgeon: Sherida Adine BROCKS, MD;  [facility];  Service: Orthopedics;  Laterality: Right;  RIGHT ANKLE WOUND I&D   INCISION AND DRAINAGE OF WOUND Right 07/28/2023   Procedure: IRRIGATION AND DEBRIDEMENT WOUND;  Surgeon: Lowery Estefana RAMAN, DO;  Location: [facility];  Service: Plastics;  Laterality: Right;  rrigation and debridement of right ankle wound with placement of myriad and wound vac   IR IMAGING GUIDED PORT INSERTION  07/08/2018   IRRIGATION AND DEBRIDEMENT POSTERIOR HIP Right 07/15/2023   Procedure: IRRIGATION AND DEBRIDEMENT, OPEN FRACTURE;  Surgeon: Sherida Adine BROCKS, MD;  Location: [facility];  Service: Orthopedics;  Laterality: Right;   L shoulder Surgery  2011   ORIF ANKLE FRACTURE Right 04/24/2023   Procedure: OPEN REDUCTION INTERNAL FIXATION (ORIF) ANKLE FRACTURE;  Surgeon: Sherida Adine BROCKS, MD;  Location: [facility];  Service: Orthopedics;  Laterality: Right;   POLYPECTOMY  03/02/2020   Procedure: POLYPECTOMY;  Surgeon: Donnald Charleston, MD;  Location: [facility];  Service: Endoscopy;;   SCLEROTHERAPY   11/03/2020   Procedure: MATIAS;  Surgeon: Dianna Specking, MD;  Location: [facility] ENDOSCOPY;  Service: Endoscopy;;   SUBMUCOSAL INJECTION  09/22/2017   Procedure: SUBMUCOSAL INJECTION;  Surgeon: Rosalie Kitchens, MD;  Location: [facility] ENDOSCOPY;  Service: Endoscopy;;   SUBMUCOSAL INJECTION  12/04/2018   Procedure: SUBMUCOSAL INJECTION;  Surgeon: Dianna Specking, MD;  Location: [facility];  Service: Endoscopy;;   Social History   Socioeconomic History   Marital status: Widowed    Spouse name: Not on file   Number of children: 3   Years of education: 52   Highest education level: Not on file  Occupational History   Not on file  Tobacco Use   Smoking status: Every Day    Current packs/day: 0.50    Average packs/day: 0.5 packs/day for 30.0 years (15.0 ttl pk-yrs)    Types: Cigarettes   Smokeless tobacco: Former    Types: Snuff    Quit date: 2   Tobacco comments:    1/2 PPD per patient on 03/10/24  Vaping Use   Vaping status: Never Used  Substance and Sexual Activity   Alcohol use: No    Alcohol/week: 0.0 standard drinks of alcohol   Drug use: Not Currently    Comment: last cocaine-2010   Sexual activity: Not Currently    Comment: Hysterectomy  Other Topics Concern   Not on file  Social History Narrative   Not on file   Social Drivers of Health   Financial Resource Strain: Not on file  Food Insecurity: No Food Insecurity (03/06/2024)   Hunger Vital Sign    Worried About Running Out of Food in the Last Year: Never true    Ran Out of Food in the Last  Year: Never true  Transportation Needs: No Transportation Needs (03/06/2024)   PRAPARE - Administrator, Civil Service (Medical): No    Lack of Transportation (Non-Medical): No  Physical Activity: Not on file  Stress: Not on file  Social Connections: Not on file   Family History  Problem Relation Age of Onset   Cancer Mother        Ovarian   Ovarian cancer Mother    Diabetes Mother    Kidney disease Mother     Bleeding Disorder Mother    Diabetes type II Sister    Bleeding Disorder Sister    Diabetes Sister    Colon cancer Maternal Grandfather    Crohn's disease Maternal Grandfather    Stomach cancer Maternal Grandmother    Kidney disease Son    Bleeding Disorder Son    Dysmenorrhea Neg Hx    Allergies  Allergen Reactions   Feraheme  [Ferumoxytol ] Other (See Comments)    Muscle tetany, encephalopathy, fatigue, nausea (reaction to injection)   Nsaids Other (See Comments)    Stomach bleeding episodes; Tylenol  is OK   Wasp Venom Anaphylaxis   Tomato Hives   Iron  (Ferrous Sulfate ) [Ferrous Sulfate  Er] Other (See Comments)    Shock   Prior to Admission medications   Medication Sig Start Date End Date Taking? Authorizing Provider  Accu-Chek Softclix Lancets lancets Use to check blood sugar before breakfast and before dinner while on steroids Patient taking differently: 1 each by Other route See admin instructions. Use to check blood sugar before breakfast and before dinner while on steroids 09/27/19   Arminda Daved HERO, MD  acetaminophen  (TYLENOL ) 325 MG tablet Take 2 tablets (650 mg total) by mouth every 6 (six) hours as needed. 05/19/23   Raulkar, Sven SQUIBB, MD  ALPRAZolam  (XANAX ) 0.5 MG tablet Take 0.5 mg by mouth at bedtime.    [provider]  aminocaproic  acid (AMICAR ) 500 MG tablet Take 2 tablets (1,000 mg total) by mouth every 12 (twelve) hours. 02/18/24   Lanny Callander, MD  amLODipine  (NORVASC ) 10 MG tablet Take 1 tablet (10 mg total) by mouth daily. 06/10/23 06/09/24  Gregary Sharper, MD  citalopram  (CELEXA ) 20 MG tablet TAKE 1 TABLET(20 MG) BY MOUTH DAILY Patient taking differently: Take 20 mg by mouth daily as needed (for anxiety). 08/27/23   Nooruddin, Saad, MD  gabapentin  (NEURONTIN ) 100 MG capsule Take 1 capsule (100 mg total) by mouth at bedtime. 07/04/23   Raulkar, Sven SQUIBB, MD  glucose blood (ACCU-CHEK GUIDE) test strip Check blood sugar 2 times per day while on steroids  before breakfast and dinner Patient taking differently: 1 each by Other route See admin instructions. Check blood sugar 2 times per day while on steroids before breakfast and dinner 09/27/19   Arminda Daved HERO, MD  leptospermum manuka honey (MEDIHONEY) PSTE paste Apply 1 Application topically daily. 12/28/23   Tobie Gaines, DO  lidocaine  4 % Place 1 patch onto the skin daily. Remove & Discard patch within 12 hours or as directed by MD 12/27/23   Tobie Gaines, DO  lisinopril  (ZESTRIL ) 20 MG tablet Take 1 tablet (20 mg total) by mouth daily. 06/23/23   Nooruddin, Saad, MD  pantoprazole  (PROTONIX ) 40 MG tablet Take 1 tablet (40 mg total) by mouth 2 (two) times daily. TAKE 1 TABLET(40 MG) BY MOUTH TWICE DAILY 03/07/24 05/06/24  Leotis Bogus, MD  senna-docusate (SENOKOT-S) 8.6-50 MG tablet Take 1 tablet by mouth at bedtime. 05/19/23   Raulkar, Sven SQUIBB,  MD  traMADol  (ULTRAM ) 50 MG tablet Take 50 mg by mouth every 6 (six) hours as needed. for pain    [provider]  traZODone  (DESYREL ) 50 MG tablet TAKE 1 TABLET(50 MG) BY MOUTH AT BEDTIME AS NEEDED FOR SLEEP 11/14/23   Nooruddin, Saad, MD     Positive ROS: All other systems have been reviewed and were otherwise negative with the exception of those mentioned in the HPI and as above.  Physical Exam:  There were no vitals filed for this visit. General: Alert, no acute distress Cardiovascular: No pedal edema Respiratory: No cyanosis, no use of accessory musculature GI: No organomegaly, abdomen is soft and non-tender Skin: No lesions in the area of chief complaint Neurologic: Sensation intact distally Psychiatric: Patient is competent for consent with normal mood and affect Lymphatic: No axillary or cervical lymphadenopathy  MUSCULOSKELETAL: Right leg demonstrates evidence of prior lateral incision.  Some foul odor there.  No gross dehiscence but certainly some weeping from the wound.  Some macerated tissue.  Swelling preRLE sent.  Ankle alignment  acceptable.  Sensation grossly intact distally.  Assessment: RLE wound and concern for deep infection.   Plan: Plan for addressing her wound and likely deep infection.  Possible wound VAC placement of the right lower extremity.  We discussed the risks, benefits and alternatives of surgery which include but are not limited to wound healing complications, infection, nonunion, malunion, need for further surgery, damage to surrounding structures and continued pain.  They understand there is no guarantees to an acceptable outcome.  After weighing these risks they opted to proceed with surgery.     Lonni JONELLE Pae, MD    03/16/2024 6:16 AM

## 2024-03-16 NOTE — Anesthesia Procedure Notes (Signed)
 Procedure Name: LMA Insertion Date/Time: 03/16/2024 7:48 AM  Performed by: Delores Dus, CRNAPre-anesthesia Checklist: Patient identified, Emergency Drugs available, Suction available, Patient being monitored and Timeout performed Patient Re-evaluated:Patient Re-evaluated prior to induction Oxygen Delivery Method: Circle system utilized Preoxygenation: Pre-oxygenation with 100% oxygen Induction Type: IV induction LMA Size: 4.0 Number of attempts: 1

## 2024-03-16 NOTE — Discharge Instructions (Signed)
 [facility] POST-OP INSTRUCTIONS   Pain Management The numbing medicine and your leg will last around 18 hours, take a dose of your pain medicine as soon as you feel it wearing off to avoid rebound pain. Keep your foot elevated above heart level.  Make sure that your heel hangs free ('floats'). Take all prescribed medication as directed. If taking narcotic pain medication you may want to use an over-the-counter stool softener to avoid constipation. You may take over-the-counter NSAIDs (ibuprofen, naproxen, etc.) as well as over-the-counter acetaminophen  as directed on the packaging as a supplement for your pain and may also use it to wean away from the prescription medication.  Activity Partial weight bearing in the splint Keep splint and wound vac in place   First Postoperative Visit Your first postop visit will be Friday 03/19/24.  This should be scheduled when you schedule surgery. If you do not have a postoperative visit scheduled please call [phone] to schedule an appointment. At the appointment your incision will be evaluated for suture removal, x-rays will be obtained if necessary.  General Instructions Swelling is very common after foot and ankle surgery.  It often takes 3 months for the foot and ankle to begin to feel comfortable.  Some amount of swelling will persist for 6-12 months. DO NOT change the dressing.  If there is a problem with the dressing (too tight, loose, gets wet, etc.) please contact Dr. Isiah office. DO NOT get the dressing wet.  For showers you can use an over-the-counter cast cover or wrap a washcloth around the top of your dressing and then cover it with a plastic bag and tape it to your leg. DO NOT soak the incision (no tubs, pools, bath, etc.) until you have approval from Dr. Elsa.  Contact Dr. Nadara office or go to Emergency Room if: Temperature above 101 F. Increasing pain that is unresponsive to pain medication or  elevation Excessive redness or swelling in your foot Dressing problems - excessive bloody drainage, looseness or tightness, or if dressing gets wet Develop pain, swelling, warmth, or discoloration of your calf

## 2024-03-16 NOTE — Op Note (Signed)
 Peggy House female 63 y.o. 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 female sustained an ankle fracture several months ago and underwent open treatment with outside provider.  She subsequently had wound dehiscence of the lateral aspect of her leg and had hardware removal, I&D and closure.  She did have wound VAC placed at the time.  She was then sent to me due to continued wound dehiscence and wound drainage.  She had significant amount of bony hypertrophy and evidence of osteomyelitis of the fibula and tibia around the area of the fracture on MRI scan.  She was indicated for surgery.   Patient understood the risks, benefits and alternatives to surgery which include but are not limited to wound healing complications, infection, nonunion, malunion, need for further surgery as well as damage to surrounding structures. They also understood the potential for continued pain in that there were no guarantees of acceptable outcome After weighing these risks the patient opted to proceed with surgery.  PROCEDURE: Patient was identified in the preoperative holding area.  The right leg was marked by myself.  Consent was signed by myself and the patient.  Block was performed by anesthesia in the preoperative holding area.  Patient was taken to the operative suite and placed  supine on the operative table.  General LMA anesthesia was induced without difficulty. Bump was placed under the operative hip and bone foam was used.  All bony prominences were well padded.  Tourniquet was placed on the operative thigh.  Preoperative antibiotics were given. The extremity was prepped and draped in the usual sterile fashion and surgical timeout was performed.    We began by inspecting the wound.  There was an area of chronic wound dehiscence to the lateral aspect of the leg with skin hypertrophy and maceration.  The extent of the wound was approximately 15 cm and there was sinus drainage centrally about the wound down to the fibula.  A 10 blade was used to open up the area of wound dehiscence and to sharply dissect down to the lateral aspect of the fibula.  Then a deep 15 blade was used to elevate skin flaps anteriorly and posteriorly about the lateral aspect of the leg in a full-thickness fashion to undermine the adhesions to the lateral deep soft tissues.  The wound edges were then resected using a 15 blade back to healthy appearing tissue.  There was significant amount of thickening and scarring of the deep soft tissues that was also removed using a 15 blade and a rondure.  We then proceeded to dissected further down to the fibula.  The area of prior hardware placement was identified and there was amount of bony hypertrophy and a draining sinus from the lateral aspect of the fibula.  The bony hypertrophy was removed using a rondure.  Then saucerization was performed of the lateral aspect of the fibula at the site of the draining sinus.  Cortical bone was removed to gain access to the cancellous under lying the area of the  sinus.  Then a curette was used to curette out portion of this area and a saucerization technique.  Bone sample was sent for culture.  Then a separate incision was created anterior to the fibula to gain access to the tibia at the area of presumed osteomyelitis.  This was  done through the deep tissue to gain access to the lateral aspect of the tibial cortex.  Then using a curette and an osteotome a cortical window was made in a saucerization fashion into the lateral aspect of the tibia.  Tibial bone was then curetted out in the area of presumed osteomyelitis and small area of sequestrum was removed and placed in the culture sample.  We then proceeded to irrigate the wound copiously with normal saline using cystoscopy tubing.  The area was irrigated using 3000 cc of normal saline.  Then the wound was inspected.  It appeared clean and there was no further signs of infection.  No abscess was found.  Then 500 cc of vancomycin  powder was placed within the wound and placed within the fibular window as well as the tibial cortical window.  We then proceeded to close the chronic dehisced wound.  Using combination of tension sutures, vertical mattress sutures and simple sutures using a 2-0 nylon suture the wound was closed.  There was some tension centrally about the wound but he did have good tissue tissue apposition after closure.  Then the leg was cleaned.  A wound VAC was placed overlying the area of sinus tract and wound dehiscence.  There was good suction to the wound VAC and seal was confirmed.  She was then placed in a posterior slab short leg splint.  She was awakened from anesthesia and taken to recovery in stable condition.  No complications.  POST OPERATIVE INSTRUCTIONS: Right lower extremity Keep wound VAC in place She will follow-up at the end of the week for wound VAC removal and wound check. Continue antibiotics   TOURNIQUET TIME:No tourniquet  BLOOD LOSS:  less than 50 mL         DRAINS: none         SPECIMEN: none       COMPLICATIONS:  * No complications entered in OR log *         Disposition: PACU - hemodynamically stable.         Condition: stable

## 2024-03-17 ENCOUNTER — Other Ambulatory Visit: Payer: Self-pay

## 2024-03-17 ENCOUNTER — Telehealth: Payer: Self-pay | Admitting: Student

## 2024-03-17 ENCOUNTER — Other Ambulatory Visit: Payer: Self-pay | Admitting: Hematology

## 2024-03-17 ENCOUNTER — Other Ambulatory Visit: Payer: Self-pay | Admitting: Student

## 2024-03-17 ENCOUNTER — Other Ambulatory Visit (HOSPITAL_COMMUNITY): Payer: Self-pay

## 2024-03-17 ENCOUNTER — Encounter (HOSPITAL_COMMUNITY): Payer: Self-pay | Admitting: Orthopaedic Surgery

## 2024-03-17 ENCOUNTER — Inpatient Hospital Stay

## 2024-03-17 MED ORDER — CITALOPRAM HYDROBROMIDE 20 MG PO TABS
20.0000 mg | ORAL_TABLET | Freq: Every day | ORAL | 2 refills | Status: AC
  Filled 2024-03-17 (×2): qty 90, 90d supply, fill #0

## 2024-03-17 NOTE — Telephone Encounter (Signed)
 Copied from CRM [id number]. Topic: Clinical - Medication Refill >> Mar 17, 2024  3:47 PM Debby BROCKS wrote: Medication: traZODone  (DESYREL ) 50 MG tablet ALPRAZolam  (XANAX ) 0.5 MG tablet   Has the patient contacted their pharmacy? Yes (Agent: If no, request that the patient contact the pharmacy for the refill. If patient does not wish to contact the pharmacy document the reason why and proceed with request.) (Agent: If yes, when and what did the pharmacy advise?)  This is the patient's preferred pharmacy:  [facility] [phone] - [place], [place] - [address] AT [facility] [address] [place] [address] Phone: [phone] Fax: [phone]  Is this the correct pharmacy for this prescription? Yes If no, delete pharmacy and type the correct one.   Has the prescription been filled recently? No  Is the patient out of the medication? Yes  Has the patient been seen for an appointment in the last year OR does the patient have an upcoming appointment? Yes  Can we respond through MyChart? Yes  Agent: Please be advised that Rx refills may take up to 3 business days. We ask that you follow-up with your pharmacy.

## 2024-03-18 ENCOUNTER — Other Ambulatory Visit: Payer: Self-pay

## 2024-03-18 ENCOUNTER — Inpatient Hospital Stay

## 2024-03-18 ENCOUNTER — Inpatient Hospital Stay: Attending: Hematology | Admitting: Hematology

## 2024-03-18 VITALS — BP 134/66 | HR 64 | Temp 98.2°F | Resp 17 | Ht 64.0 in

## 2024-03-18 VITALS — BP 132/69 | HR 65 | Temp 98.1°F | Resp 21

## 2024-03-18 DIAGNOSIS — Q2733 Arteriovenous malformation of digestive system vessel: Secondary | ICD-10-CM | POA: Insufficient documentation

## 2024-03-18 DIAGNOSIS — D500 Iron deficiency anemia secondary to blood loss (chronic): Secondary | ICD-10-CM

## 2024-03-18 DIAGNOSIS — I780 Hereditary hemorrhagic telangiectasia: Secondary | ICD-10-CM | POA: Diagnosis not present

## 2024-03-18 DIAGNOSIS — D649 Anemia, unspecified: Secondary | ICD-10-CM

## 2024-03-18 DIAGNOSIS — K922 Gastrointestinal hemorrhage, unspecified: Secondary | ICD-10-CM | POA: Insufficient documentation

## 2024-03-18 DIAGNOSIS — Z95828 Presence of other vascular implants and grafts: Secondary | ICD-10-CM

## 2024-03-18 LAB — CBC WITH DIFFERENTIAL (CANCER CENTER ONLY)
Abs Immature Granulocytes: 0.04 K/uL (ref 0.00–0.07)
Basophils Absolute: 0.1 K/uL (ref 0.0–0.1)
Basophils Relative: 1 %
Eosinophils Absolute: 0.2 K/uL (ref 0.0–0.5)
Eosinophils Relative: 1 %
HCT: 24.5 % — ABNORMAL LOW (ref 36.0–46.0)
Hemoglobin: 7.4 g/dL — ABNORMAL LOW (ref 12.0–15.0)
Immature Granulocytes: 0 %
Lymphocytes Relative: 18 %
Lymphs Abs: 2.3 K/uL (ref 0.7–4.0)
MCH: 24.6 pg — ABNORMAL LOW (ref 26.0–34.0)
MCHC: 30.2 g/dL (ref 30.0–36.0)
MCV: 81.4 fL (ref 80.0–100.0)
Monocytes Absolute: 0.6 K/uL (ref 0.1–1.0)
Monocytes Relative: 5 %
Neutro Abs: 9.5 K/uL — ABNORMAL HIGH (ref 1.7–7.7)
Neutrophils Relative %: 75 %
Platelet Count: 385 K/uL (ref 150–400)
RBC: 3.01 MIL/uL — ABNORMAL LOW (ref 3.87–5.11)
RDW: 19.6 % — ABNORMAL HIGH (ref 11.5–15.5)
WBC Count: 12.7 K/uL — ABNORMAL HIGH (ref 4.0–10.5)
nRBC: 0 % (ref 0.0–0.2)

## 2024-03-18 LAB — IRON AND IRON BINDING CAPACITY (CC-WL,HP ONLY)
Iron: 16 ug/dL — ABNORMAL LOW (ref 28–170)
Saturation Ratios: 5 % — ABNORMAL LOW (ref 10.4–31.8)
TIBC: 336 ug/dL (ref 250–450)
UIBC: 320 ug/dL

## 2024-03-18 LAB — SAMPLE TO BLOOD BANK

## 2024-03-18 LAB — FERRITIN: Ferritin: 12 ng/mL (ref 11–307)

## 2024-03-18 MED ORDER — DIPHENHYDRAMINE HCL 25 MG PO CAPS
25.0000 mg | ORAL_CAPSULE | Freq: Once | ORAL | Status: AC
  Administered 2024-03-18: 25 mg via ORAL
  Filled 2024-03-18: qty 1

## 2024-03-18 MED ORDER — CETIRIZINE HCL 10 MG/ML IV SOLN
10.0000 mg | Freq: Once | INTRAVENOUS | Status: AC
  Administered 2024-03-18: 10 mg via INTRAVENOUS
  Filled 2024-03-18: qty 1

## 2024-03-18 MED ORDER — ACETAMINOPHEN 325 MG PO TABS
650.0000 mg | ORAL_TABLET | Freq: Once | ORAL | Status: AC
  Administered 2024-03-18: 650 mg via ORAL
  Filled 2024-03-18: qty 2

## 2024-03-18 MED ORDER — SODIUM CHLORIDE 0.9 % IV SOLN
1000.0000 mg | INTRAVENOUS | Status: DC
  Administered 2024-03-18: 1000 mg via INTRAVENOUS
  Filled 2024-03-18: qty 10

## 2024-03-18 MED ORDER — SODIUM CHLORIDE 0.9 % IV SOLN: INTRAVENOUS | Status: DC

## 2024-03-18 MED ORDER — TRAZODONE HCL 50 MG PO TABS
50.0000 mg | ORAL_TABLET | Freq: Every day | ORAL | 0 refills | Status: AC
  Filled 2024-03-18: qty 30, 30d supply, fill #0

## 2024-03-18 NOTE — Assessment & Plan Note (Signed)
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

## 2024-03-18 NOTE — Assessment & Plan Note (Signed)
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 -she has required multiple blood transfusions and APCs since 2019. 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 -She had multiple prolonged hospital stay due to severe GI bleeding.   -s/p IR embolization of gastric artery branch vessels on 12/10/17, cauterization of stomach for severe GI bleed in 11/2018, and epinephrine injection for bleeding ulcer on 11/03/20. -She has required frequent blood transfusion, IV iron and bevacizumab -She does respond to treatment, anemia improved with IV iron and bevacizumab, but she is not compliant with her appointments.

## 2024-03-18 NOTE — Telephone Encounter (Signed)
 Unable to pend Trazodone 

## 2024-03-18 NOTE — Patient Instructions (Signed)
 Ferric Derisomaltose Injection What is this medication? FERRIC DERISOMALTOSE (FER ik der EYE soe MAWL tose) treats low levels of iron in your body (iron deficiency anemia). Iron is a mineral that plays an important role in making red blood cells, which carry oxygen from your lungs to the rest of your body. This medicine may be used for other purposes; ask your health care provider or pharmacist if you have questions. COMMON BRAND NAME(S): MONOFERRIC What should I tell my care team before I take this medication? They need to know if you have any of these conditions: High levels of iron in the blood An unusual or allergic reaction to iron, other medications, foods, dyes, or preservatives Pregnant or trying to get pregnant Breastfeeding How should I use this medication? This medication is injected into a vein. It is given by your care team in a hospital or clinic setting. Talk to your care team about the use of this medication in children. Special care may be needed. Overdosage: If you think you have taken too much of this medicine contact a poison control center or emergency room at once. NOTE: This medicine is only for you. Do not share this medicine with others. What if I miss a dose? It is important not to miss your dose. Call your care team if you are unable to keep an appointment. What may interact with this medication? Do not take this medication with any of the following: Deferoxamine Dimercaprol Other iron products This list may not describe all possible interactions. Give your health care provider a list of all the medicines, herbs, non-prescription drugs, or dietary supplements you use. Also tell them if you smoke, drink alcohol, or use illegal drugs. Some items may interact with your medicine. What should I watch for while using this medication? Visit your care team for regular checks on your progress. Tell your care team if your symptoms do not start to get better or if they get  worse. You may need blood work done while you are taking this medication. You may need to eat more foods that contain iron. Talk to your care team. Foods that contain iron include whole grains or cereals, dried fruits, beans, peas, leafy green vegetables, and organ meats (liver, kidney). What side effects may I notice from receiving this medication? Side effects that you should report to your care team as soon as possible: Allergic reactions--skin rash, itching, hives, swelling of the face, lips, tongue, or throat Low blood pressure--dizziness, feeling faint or lightheaded, blurry vision Shortness of breath Side effects that usually do not require medical attention (report to your care team if they continue or are bothersome): Flushing Headache Joint pain Muscle pain Nausea Pain, redness, or irritation at injection site This list may not describe all possible side effects. Call your doctor for medical advice about side effects. You may report side effects to FDA at 1-800-FDA-1088. Where should I keep my medication? This medication is given in a hospital or clinic. It will not be stored at home. NOTE: This sheet is a summary. It may not cover all possible information. If you have questions about this medicine, talk to your doctor, pharmacist, or health care provider.  2024 Elsevier/Gold Standard (2022-11-20 00:00:00)

## 2024-03-18 NOTE — Progress Notes (Signed)
 Patient observed for 30 minutes following iron  infusion.  Tolerated treatment well without incident.  VSS at discharge.  Assisted to lobby via wheelchair.

## 2024-03-19 ENCOUNTER — Encounter: Payer: Self-pay | Admitting: Student

## 2024-03-19 ENCOUNTER — Other Ambulatory Visit: Payer: Self-pay

## 2024-03-19 ENCOUNTER — Encounter: Payer: Self-pay | Admitting: Hematology

## 2024-03-19 ENCOUNTER — Telehealth: Payer: Self-pay | Admitting: Hematology

## 2024-03-19 ENCOUNTER — Other Ambulatory Visit: Payer: Self-pay | Admitting: *Deleted

## 2024-03-19 ENCOUNTER — Other Ambulatory Visit (HOSPITAL_COMMUNITY): Payer: Self-pay

## 2024-03-19 DIAGNOSIS — D500 Iron deficiency anemia secondary to blood loss (chronic): Secondary | ICD-10-CM

## 2024-03-19 LAB — PREPARE RBC (CROSSMATCH)

## 2024-03-19 NOTE — Progress Notes (Addendum)
 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 -she has required multiple blood transfusions and APCs since 2019. 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 -She had multiple prolonged hospital stay due to severe GI bleeding.   -s/p IR embolization of gastric artery branch vessels on 12/10/17, cauterization of stomach for severe GI bleed in 11/2018, and epinephrine  injection for bleeding ulcer on 11/03/20. -She has required frequent blood transfusion, IV iron  and bevacizumab  -She does respond to treatment, anemia improved with IV iron  and bevacizumab , but she is not compliant with her appointments.  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 Hemoglobin was 8.0 two days ago, likely lower today due to recent surgery without blood transfusion. No recent blood transfusion since November 23rd. Bevacizumab  discussed to manage bleeding, though it may slow healing. - Administered IV  iron  today - Scheduled blood transfusion for Saturday morning at 8:30 AM - Scheduled follow-up every two weeks for blood transfusion - Instructed to call if experiencing increased bleeding or fatigue  Postoperative wound care of right ankle Postoperative wound care following debridement of right ankle wound. She is unsure about the operation of the suction device for wound drainage. - Advised to call the provided number for assistance with the suction device  Plan - Lab reviewed, hemoglobin 7.8, will proceed 1 dose Monoferric  today -Blood transfusion with 1 unit of blood on Saturday this week - Lab and a blood transfusion every 2 weeks - Follow-up and monotherapy in 4 weeks   Discussed the use of AI scribe software for clinical note transcription with the patient, who gave verbal consent to proceed.  History of Present Illness Peggy House is a 63 year old female with iron  deficiency anemia and hereditary hemorrhagic telangiectasia who presents for follow-up.  She recently had surgical debridement of a non-healing ankle wound. She reports difficulty managing the wound drainage device but does not describe active bleeding from the site.  Her hemoglobin was 8.0 two days ago and is improving. She received a blood transfusion on November 23 but did not require one last week. During a hospitalization the week before last she received several doses of oral iron . She did not receive a transfusion after her recent surgery despite some intraoperative blood loss.     All other systems were reviewed with the patient and are negative.  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   no meds, diet controlled, does not check blood sugar    SURGICAL HISTORY: Past Surgical History:  Procedure Laterality Date   ABDOMINAL HYSTERECTOMY     APPLICATION OF WOUND VAC Right 07/28/2023   Procedure: APPLICATION, WOUND VAC;  Surgeon: Lowery Estefana RAMAN, DO;  Location: [facility] OR;  Service: Plastics;  Laterality: Right;   APPLICATION OF WOUND VAC Right 03/16/2024   Procedure: APPLICATION, WOUND VAC;  Surgeon: Elsa Lonni SAUNDERS, MD;  Location: [facility] OR;  Service: Orthopedics;  Laterality: Right;   APPLICATION, SKIN SUBSTITUTE Right 07/28/2023   Procedure: APPLICATION, SKIN SUBSTITUTE;  Surgeon: Lowery Estefana RAMAN, DO;  Location: [facility] OR;  Service: Plastics;  Laterality: Right;   CARPAL TUNNEL RELEASE  05/13/2011   Procedure: CARPAL TUNNEL RELEASE;  Surgeon: Eva Elsie Herring, MD;  Location: [facility];  Service: Orthopedics;  Laterality: Left;   COLONOSCOPY N/A 03/02/2020   Procedure: COLONOSCOPY;  Surgeon: Donnald Charleston, MD;  Location: [facility] ENDOSCOPY;  Service: Endoscopy;  Laterality: N/A;   COLONOSCOPY WITH PROPOFOL  N/A 04/28/2014   Procedure: COLONOSCOPY WITH PROPOFOL ;  Surgeon: Charleston Donnald GAILS, MD;  Location: [facility] ENDOSCOPY;  Service: Endoscopy;  Laterality: N/A;   DG TOES*L*  2/10   rt   DILATION AND CURETTAGE OF UTERUS     ENTEROSCOPY N/A 10/17/2017   Procedure: ENTEROSCOPY;  Surgeon: Elicia Claw, MD;  Location: [facility] ENDOSCOPY;  Service: Gastroenterology;  Laterality: N/A;   ESOPHAGOGASTRODUODENOSCOPY N/A 04/10/2014   Procedure: ESOPHAGOGASTRODUODENOSCOPY (EGD);  Surgeon: Jerrell KYM Sol, MD;  Location[facility] ENDOSCOPY;  Service: Endoscopy;  Laterality: N/A;   ESOPHAGOGASTRODUODENOSCOPY N/A 05/10/2017   Procedure: ESOPHAGOGASTRODUODENOSCOPY (EGD);  Surgeon:  Donnald Charleston, MD;  Location: [facility] ENDOSCOPY;  Service: Endoscopy;  Laterality: N/A;   ESOPHAGOGASTRODUODENOSCOPY N/A 09/22/2017   Procedure: ESOPHAGOGASTRODUODENOSCOPY (EGD);  Surgeon: Rosalie Kitchens, MD;  Location: [facility] ENDOSCOPY;  Service: Endoscopy;  Laterality: N/A;  bedside   ESOPHAGOGASTRODUODENOSCOPY N/A 03/02/2020   Procedure: ESOPHAGOGASTRODUODENOSCOPY (EGD);  Surgeon: Donnald Charleston, MD;  Location: [facility];  Service: Endoscopy;  Laterality: N/A;   ESOPHAGOGASTRODUODENOSCOPY N/A 11/03/2020   Procedure: ESOPHAGOGASTRODUODENOSCOPY (EGD);  Surgeon: Sol Jerrell, MD;  Location: [facility] ENDOSCOPY;  Service: Endoscopy;  Laterality: N/A;   ESOPHAGOGASTRODUODENOSCOPY N/A 04/12/2022   Procedure: ESOPHAGOGASTRODUODENOSCOPY (EGD);  Surgeon: Rosalie Kitchens, MD;  Location: [facility];  Service: Gastroenterology;  Laterality: N/A;   ESOPHAGOGASTRODUODENOSCOPY N/A 05/27/2022   Procedure: ESOPHAGOGASTRODUODENOSCOPY (EGD);  Surgeon: Burnette Elsie, MD;  Location: [facility];  Service: Gastroenterology;  Laterality: N/A;   ESOPHAGOGASTRODUODENOSCOPY N/A 09/27/2022   Procedure: ESOPHAGOGASTRODUODENOSCOPY (EGD);  Surgeon: Kriss Estefana DEL, DO;  Location: [facility] ENDOSCOPY;  Service: Gastroenterology;  Laterality: N/A;   ESOPHAGOGASTRODUODENOSCOPY N/A 03/06/2024   Procedure: EGD (ESOPHAGOGASTRODUODENOSCOPY);  Surgeon: San Sandor GAILS, DO;  Location: [facility] ENDOSCOPY;  Service: Gastroenterology;  Laterality: N/A;   ESOPHAGOGASTRODUODENOSCOPY (EGD) WITH PROPOFOL  N/A 04/27/2014   Procedure: ESOPHAGOGASTRODUODENOSCOPY (EGD) WITH PROPOFOL ;  Surgeon: Charleston Donnald GAILS, MD;  Location: [facility] ENDOSCOPY;  Service: Endoscopy;  Laterality: N/A;  possible apc   ESOPHAGOGASTRODUODENOSCOPY (EGD) WITH PROPOFOL  N/A 09/30/2017   Procedure:  ESOPHAGOGASTRODUODENOSCOPY (EGD) WITH PROPOFOL ;  Surgeon: Saintclair Jasper, MD;  Location: [facility] ENDOSCOPY;  Service: Gastroenterology;  Laterality: N/A;   ESOPHAGOGASTRODUODENOSCOPY (EGD) WITH PROPOFOL  N/A  10/01/2017   Procedure: ESOPHAGOGASTRODUODENOSCOPY (EGD) WITH PROPOFOL ;  Surgeon: Saintclair Jasper, MD;  Location: [facility] ENDOSCOPY;  Service: Gastroenterology;  Laterality: N/A;   ESOPHAGOGASTRODUODENOSCOPY (EGD) WITH PROPOFOL  N/A 10/08/2017   Procedure: ESOPHAGOGASTRODUODENOSCOPY (EGD) WITH PROPOFOL ;  Surgeon: Elicia Claw, MD;  Location: [facility] ENDOSCOPY;  Service: Gastroenterology;  Laterality: N/A;   ESOPHAGOGASTRODUODENOSCOPY (EGD) WITH PROPOFOL  N/A 10/17/2017   Procedure: ESOPHAGOGASTRODUODENOSCOPY (EGD) WITH PROPOFOL ;  Surgeon: Elicia Claw, MD;  Location: [facility] ENDOSCOPY;  Service: Gastroenterology;  Laterality: N/A;   ESOPHAGOGASTRODUODENOSCOPY (EGD) WITH PROPOFOL  N/A 10/19/2017   Procedure: ESOPHAGOGASTRODUODENOSCOPY (EGD) WITH PROPOFOL ;  Surgeon: Elicia Claw, MD;  Location: [facility] ENDOSCOPY;  Service: Gastroenterology;  Laterality: N/A;   ESOPHAGOGASTRODUODENOSCOPY (EGD) WITH PROPOFOL  N/A 12/04/2018   Procedure: ESOPHAGOGASTRODUODENOSCOPY (EGD) WITH PROPOFOL ;  Surgeon: Dianna Specking, MD;  Location: [facility] ENDOSCOPY;  Service: Endoscopy;  Laterality: N/A;   GIVENS CAPSULE STUDY N/A 10/02/2017   Procedure: GIVENS CAPSULE STUDY;  Surgeon: Saintclair Jasper, MD;  Location: [facility] ENDOSCOPY;  Service: Gastroenterology;  Laterality: N/A;   GIVENS CAPSULE STUDY N/A 10/08/2017   Procedure: GIVENS CAPSULE STUDY;  Surgeon: Elicia Claw, MD;  Location: [facility] ENDOSCOPY;  Service: Gastroenterology;  Laterality: N/A;  endoscopic placement of capsule   GIVENS CAPSULE STUDY N/A 03/02/2020   Procedure: GIVENS CAPSULE STUDY;  Surgeon: Donnald Charleston, MD;  Location: [facility];  Service: Endoscopy;  Laterality: N/A;   HARDWARE REMOVAL Right 07/15/2023   Procedure: REMOVAL, HARDWARE;  Surgeon: Sherida Adine BROCKS, MD;  Location: [facility] OR;  Service: Orthopedics;  Laterality: Right;   HEMOSTASIS CLIP PLACEMENT  12/04/2018   Procedure: HEMOSTASIS CLIP PLACEMENT;  Surgeon: Dianna Specking, MD;  Location: [facility] ENDOSCOPY;  Service:  Endoscopy;;   HEMOSTASIS CLIP PLACEMENT  05/27/2022   Procedure: HEMOSTASIS CLIP PLACEMENT;  Surgeon: Burnette Fallow, MD;  Location: [facility] ENDOSCOPY;  Service: Gastroenterology;;   HEMOSTASIS CLIP PLACEMENT  09/27/2022   Procedure: HEMOSTASIS CLIP PLACEMENT;  Surgeon: Kriss Estefana DEL, DO;  Location: [facility] ENDOSCOPY;  Service: Gastroenterology;;   HEMOSTASIS CONTROL  05/27/2022   Procedure: HEMOSTASIS CONTROL;  Surgeon: Burnette Fallow, MD;  Location: [facility] ENDOSCOPY;  Service: Gastroenterology;;   HOT HEMOSTASIS N/A 04/27/2014   Procedure: HOT HEMOSTASIS (ARGON PLASMA COAGULATION/BICAP);  Surgeon: Charleston Donnald GAILS, MD;  Location: [facility] ENDOSCOPY;  Service: Endoscopy;  Laterality: N/A;   HOT HEMOSTASIS N/A 09/30/2017   Procedure: HOT HEMOSTASIS (ARGON PLASMA COAGULATION/BICAP);  Surgeon: Saintclair Jasper, MD;  Location: [facility] ENDOSCOPY;  Service: Gastroenterology;  Laterality: N/A;   HOT HEMOSTASIS N/A 10/01/2017   Procedure: HOT HEMOSTASIS (ARGON PLASMA COAGULATION/BICAP);  Surgeon: Saintclair Jasper, MD;  Location: [facility] ENDOSCOPY;  Service: Gastroenterology;  Laterality: N/A;   HOT HEMOSTASIS N/A 10/17/2017   Procedure: HOT HEMOSTASIS (ARGON PLASMA COAGULATION/BICAP);  Surgeon: Elicia Claw, MD;  Location: [facility] ENDOSCOPY;  Service: Gastroenterology;  Laterality: N/A;   HOT HEMOSTASIS N/A 10/19/2017   Procedure: HOT HEMOSTASIS (ARGON PLASMA COAGULATION/BICAP);  Surgeon: Elicia Claw, MD;  Location: [facility] ENDOSCOPY;  Service: Gastroenterology;  Laterality: N/A;   HOT HEMOSTASIS N/A 03/02/2020   Procedure: HOT HEMOSTASIS (ARGON PLASMA COAGULATION/BICAP);  Surgeon: Donnald Charleston, MD;  Location: [facility];  Service: Endoscopy;  Laterality: N/A;   HOT HEMOSTASIS N/A 04/12/2022   Procedure: HOT HEMOSTASIS (ARGON PLASMA COAGULATION/BICAP);  Surgeon: Rosalie Kitchens, MD;  Location: [facility];  Service: Gastroenterology;  Laterality: N/A;   INCISION AND DRAINAGE OF DEEP ABSCESS, ANKLE Right  03/16/2024   Procedure: INCISION AND  DRAINAGE OF DEEP ABSCESS, ANKLE;  Surgeon: Elsa Lonni SAUNDERS, MD;  Location: [facility] OR;  Service: Orthopedics;  Laterality: Right;   INCISION AND DRAINAGE OF WOUND Right 07/21/2023   Procedure: IRRIGATION AND DEBRIDEMENT WOUND;  Surgeon: Sherida Adine BROCKS, MD;  Location: [facility] OR;  Service: Orthopedics;  Laterality: Right;  RIGHT ANKLE WOUND I&D   INCISION AND DRAINAGE OF WOUND Right 07/28/2023   Procedure: IRRIGATION AND DEBRIDEMENT WOUND;  Surgeon: Lowery Estefana RAMAN, DO;  Location: [facility] OR;  Service: Plastics;  Laterality: Right;  rrigation and debridement of right ankle wound with placement of myriad and wound vac   IR IMAGING GUIDED PORT INSERTION  07/08/2018   IRRIGATION AND DEBRIDEMENT POSTERIOR HIP Right 07/15/2023   Procedure: IRRIGATION AND DEBRIDEMENT, OPEN FRACTURE;  Surgeon: Sherida Adine BROCKS, MD;  Location: [facility] OR;  Service: Orthopedics;  Laterality: Right;   L shoulder Surgery  2011   ORIF ANKLE FRACTURE Right 04/24/2023   Procedure: OPEN REDUCTION INTERNAL FIXATION (ORIF) ANKLE FRACTURE;  Surgeon: Sherida Adine BROCKS, MD;  Location: [facility] OR;  Service: Orthopedics;  Laterality: Right;   POLYPECTOMY  03/02/2020   Procedure: POLYPECTOMY;  Surgeon: Donnald Charleston, MD;  Location: [facility] ENDOSCOPY;  Service: Endoscopy;;   SCLEROTHERAPY  11/03/2020   Procedure: MATIAS;  Surgeon: Dianna Specking, MD;  Location: [facility] ENDOSCOPY;  Service: Endoscopy;;   SUBMUCOSAL INJECTION  09/22/2017   Procedure: SUBMUCOSAL INJECTION;  Surgeon: Rosalie Kitchens, MD;  Location: [facility] ENDOSCOPY;  Service: Endoscopy;;   SUBMUCOSAL INJECTION  12/04/2018   Procedure: SUBMUCOSAL INJECTION;  Surgeon: Dianna Specking, MD;  Location: [facility] ENDOSCOPY;  Service: Endoscopy;;    I have reviewed the social history and family history with the patient and they are unchanged from previous note.  ALLERGIES:  is allergic to feraheme  [ferumoxytol ], nsaids, wasp venom, tomato, and iron  (ferrous sulfate ) [ferrous sulfate  er].  MEDICATIONS:  Current  Outpatient Medications  Medication Sig Dispense Refill   Accu-Chek Softclix Lancets lancets Use to check blood sugar before breakfast and before dinner while on steroids (Patient taking differently: 1 each by Other route See admin instructions. Use to check blood sugar before breakfast and before dinner while on steroids) 100 each 1   acetaminophen  (TYLENOL ) 325 MG tablet Take 2 tablets (650 mg total) by mouth every 6 (six) hours as needed. 100 tablet 0   ALPRAZolam  (XANAX ) 0.5 MG tablet Take 0.5 mg by mouth at bedtime.     aminocaproic  acid (AMICAR ) 500 MG tablet Take 2 tablets (1,000 mg total) by mouth every 12 (twelve) hours. 120 tablet 3   amLODipine  (NORVASC ) 10 MG tablet Take 1 tablet (10 mg total) by mouth daily. 30 tablet 11   cefadroxil  (DURICEF) 500 MG capsule Take 1 capsule (500 mg total) by mouth 2 (two) times daily. 20 capsule 0   citalopram  (CELEXA ) 20 MG tablet TAKE 1 TABLET(20 MG) BY MOUTH DAILY (Patient taking differently: Take 20 mg by mouth daily as needed (for anxiety).) 90 tablet 3   citalopram  (CELEXA ) 20 MG tablet Take 1 tablet (20 mg total) by mouth daily. 90 tablet 2   gabapentin  (NEURONTIN ) 100 MG capsule Take 1 capsule (100 mg total) by mouth at bedtime. 90 capsule 3   glucose blood (ACCU-CHEK GUIDE) test strip Check blood sugar 2 times per day while on steroids before breakfast and dinner (Patient taking differently: 1 each by Other route See admin instructions. Check blood sugar 2 times per day while on steroids before breakfast and  dinner) 50 each 3   leptospermum manuka honey (MEDIHONEY) PSTE paste Apply 1 Application topically daily. 15 mL 0   lidocaine  4 % Place 1 patch onto the skin daily. Remove & Discard patch within 12 hours or as directed by MD 30 patch 0   lisinopril  (ZESTRIL ) 20 MG tablet Take 1 tablet (20 mg total) by mouth daily. 30 tablet 3   oxyCODONE  (ROXICODONE ) 5 MG immediate release tablet Take 1 tablet (5 mg total) by mouth every 4 (four) hours as  needed (for post op pain). 20 tablet 0   pantoprazole  (PROTONIX ) 40 MG tablet Take 1 tablet (40 mg total) by mouth 2 (two) times daily. TAKE 1 TABLET(40 MG) BY MOUTH TWICE DAILY 120 tablet 0   senna-docusate (SENOKOT-S) 8.6-50 MG tablet Take 1 tablet by mouth at bedtime. 100 tablet 0   traZODone  (DESYREL ) 50 MG tablet Take 1 tablet (50 mg total) by mouth at bedtime. 30 tablet 0   No current facility-administered medications for this visit.    PHYSICAL EXAMINATION: ECOG PERFORMANCE STATUS: 2 - Symptomatic, <50% confined to bed  Vitals:   03/18/24 1336  BP: 134/66  Pulse: 64  Resp: 17  Temp: 98.2 F (36.8 C)  SpO2: 100%   Wt Readings from Last 3 Encounters:  03/16/24 234 lb (106.1 kg)  03/04/24 234 lb (106.1 kg)  02/18/24 227 lb (103 kg)     GENERAL:alert, no distress and comfortable SKIN: skin color, texture, turgor are normal, no rashes or significant lesions EYES: normal, Conjunctiva are pink and non-injected, sclera clear NECK: supple, thyroid normal size, non-tender, without nodularity LYMPH:  no palpable lymphadenopathy in the cervical, axillary  LUNGS: clear to auscultation and percussion with normal breathing effort HEART: regular rate & rhythm and no murmurs and no lower extremity edema ABDOMEN:abdomen soft, non-tender and normal bowel sounds Musculoskeletal:no cyanosis of digits and no clubbing, (+) wrapped RLE with a vacuum   NEURO: alert & oriented x 3 with fluent speech, no focal motor/sensory deficits  Physical Exam    LABORATORY DATA:  I have reviewed the data as listed    Latest Ref Rng & Units 03/18/2024    1:11 PM 03/16/2024    6:25 AM 03/12/2024   11:55 AM  CBC  WBC 4.0 - 10.5 K/uL 12.7  10.8  10.4   Hemoglobin 12.0 - 15.0 g/dL 7.4  8.0  7.9   Hematocrit 36.0 - 46.0 % 24.5  27.4  25.9   Platelets 150 - 400 K/uL 385  366  336         Latest Ref Rng & Units 03/05/2024    4:52 AM 03/04/2024    2:40 PM 02/18/2024    1:00 PM  CMP  Glucose 70 - 99  mg/dL 86  879  92   BUN 8 - 23 mg/dL 21  18  7    Creatinine 0.44 - 1.00 mg/dL 9.26  9.09  9.12   Sodium 135 - 145 mmol/L 139  140  142   Potassium 3.5 - 5.1 mmol/L 3.9  3.9  3.7   Chloride 98 - 111 mmol/L 109  108  110   CO2 22 - 32 mmol/L 21  19  25    Calcium  8.9 - 10.3 mg/dL 8.2  8.5  9.1   Total Protein 6.5 - 8.1 g/dL 5.5   7.0   Total Bilirubin 0.0 - 1.2 mg/dL 0.7   0.3   Alkaline Phos 38 - 126 U/L 61   93  AST 15 - 41 U/L 11   10   ALT 0 - 44 U/L 7   <5       RADIOGRAPHIC STUDIES: I have personally reviewed the radiological images as listed and agreed with the findings in the report. No results found.    No orders of the defined types were placed in this encounter.  All questions were answered. The patient knows to call the clinic with any problems, questions or concerns. No barriers to learning was detected. The total time spent in the appointment was 25 minutes, including review of chart and various tests results, discussions about plan of care and coordination of care plan     [name], MD 03/18/2024

## 2024-03-19 NOTE — Progress Notes (Signed)
 Verbal order given to administer 2 units of PRBC's from Dr. Lanny.  Spoke with Maire from the blood bank.  Type and Screen order received.  Patient will have 2 units of PRBC's. On Saturday 03/20/24.  Patient was reminded about her appointment on 03/20/24.

## 2024-03-19 NOTE — Telephone Encounter (Signed)
 Called pt to confirm her appt and  dates and time. Pt is aware.

## 2024-03-20 ENCOUNTER — Inpatient Hospital Stay

## 2024-03-20 DIAGNOSIS — I780 Hereditary hemorrhagic telangiectasia: Secondary | ICD-10-CM

## 2024-03-20 DIAGNOSIS — D500 Iron deficiency anemia secondary to blood loss (chronic): Secondary | ICD-10-CM

## 2024-03-20 DIAGNOSIS — D649 Anemia, unspecified: Secondary | ICD-10-CM

## 2024-03-20 MED ORDER — SODIUM CHLORIDE 0.9% IV SOLUTION: 250.0000 mL | INTRAVENOUS | Status: DC

## 2024-03-21 LAB — AEROBIC/ANAEROBIC CULTURE W GRAM STAIN (SURGICAL/DEEP WOUND): Culture: NO GROWTH

## 2024-03-22 ENCOUNTER — Other Ambulatory Visit: Payer: Self-pay

## 2024-03-22 LAB — BPAM RBC
Blood Product Expiration Date: 202512262359
Blood Product Unit Number: 202512262359
ISSUE DATE / TIME: 202512060852
PRODUCT CODE: 202512060905
PRODUCT CODE: 202512262359
Unit Type and Rh: 202512262359
Unit Type and Rh: 600
Unit Type and Rh: 600
Unit Type and Rh: 600

## 2024-03-22 LAB — TYPE AND SCREEN
ABO/RH(D): A NEG
Antibody Screen: NEGATIVE
Unit division: 0
Unit division: 0

## 2024-03-23 ENCOUNTER — Ambulatory Visit: Payer: Self-pay | Admitting: Student

## 2024-03-25 ENCOUNTER — Other Ambulatory Visit: Payer: Self-pay

## 2024-03-29 ENCOUNTER — Other Ambulatory Visit: Payer: Self-pay

## 2024-03-30 ENCOUNTER — Encounter: Payer: Self-pay | Admitting: Hematology

## 2024-03-31 ENCOUNTER — Encounter: Payer: Self-pay | Admitting: Student

## 2024-04-01 ENCOUNTER — Inpatient Hospital Stay

## 2024-04-01 ENCOUNTER — Other Ambulatory Visit: Payer: Self-pay

## 2024-04-01 DIAGNOSIS — D500 Iron deficiency anemia secondary to blood loss (chronic): Secondary | ICD-10-CM | POA: Diagnosis not present

## 2024-04-01 DIAGNOSIS — I780 Hereditary hemorrhagic telangiectasia: Secondary | ICD-10-CM

## 2024-04-01 DIAGNOSIS — D649 Anemia, unspecified: Secondary | ICD-10-CM

## 2024-04-01 LAB — CBC WITH DIFFERENTIAL (CANCER CENTER ONLY)
Abs Immature Granulocytes: 0.04 K/uL (ref 0.00–0.07)
Basophils Absolute: 0 K/uL (ref 0.0–0.1)
Basophils Relative: 0 %
Eosinophils Absolute: 0.1 K/uL (ref 0.0–0.5)
Eosinophils Relative: 1 %
HCT: 25.9 % — ABNORMAL LOW (ref 36.0–46.0)
Hemoglobin: 7.7 g/dL — ABNORMAL LOW (ref 12.0–15.0)
Immature Granulocytes: 0 %
Lymphocytes Relative: 8 %
Lymphs Abs: 0.8 K/uL (ref 0.7–4.0)
MCH: 26.6 pg (ref 26.0–34.0)
MCHC: 29.7 g/dL — ABNORMAL LOW (ref 30.0–36.0)
MCV: 89.3 fL (ref 80.0–100.0)
Monocytes Absolute: 0.5 K/uL (ref 0.1–1.0)
Monocytes Relative: 5 %
Neutro Abs: 8.9 K/uL — ABNORMAL HIGH (ref 1.7–7.7)
Neutrophils Relative %: 86 %
Platelet Count: 264 K/uL (ref 150–400)
RBC: 2.9 MIL/uL — ABNORMAL LOW (ref 3.87–5.11)
RDW: 24.7 % — ABNORMAL HIGH (ref 11.5–15.5)
WBC Count: 10.3 K/uL (ref 4.0–10.5)
nRBC: 0 % (ref 0.0–0.2)

## 2024-04-01 LAB — SAMPLE TO BLOOD BANK

## 2024-04-01 LAB — FERRITIN: Ferritin: 127 ng/mL (ref 11–307)

## 2024-04-01 LAB — PREPARE RBC (CROSSMATCH)

## 2024-04-03 ENCOUNTER — Inpatient Hospital Stay

## 2024-04-03 ENCOUNTER — Telehealth: Payer: Self-pay

## 2024-04-03 NOTE — Telephone Encounter (Signed)
 I called patient because she was over 1 hour late for her Infusion appointment. She stated that she did not know about her appointment and did not have a ride to come in today

## 2024-04-05 ENCOUNTER — Other Ambulatory Visit: Payer: Self-pay

## 2024-04-05 LAB — BPAM RBC
Blood Product Expiration Date: 202601092359
Unit Type and Rh: 600

## 2024-04-05 LAB — TYPE AND SCREEN
ABO/RH(D): A NEG
Antibody Screen: NEGATIVE
Unit division: 0

## 2024-04-14 ENCOUNTER — Telehealth: Payer: Self-pay | Admitting: Hematology

## 2024-04-14 ENCOUNTER — Inpatient Hospital Stay

## 2024-04-16 ENCOUNTER — Telehealth: Payer: Self-pay | Admitting: *Deleted

## 2024-04-16 ENCOUNTER — Other Ambulatory Visit: Payer: Self-pay

## 2024-04-16 ENCOUNTER — Inpatient Hospital Stay: Attending: Hematology

## 2024-04-16 ENCOUNTER — Telehealth: Payer: Self-pay

## 2024-04-16 DIAGNOSIS — D649 Anemia, unspecified: Secondary | ICD-10-CM

## 2024-04-16 DIAGNOSIS — D500 Iron deficiency anemia secondary to blood loss (chronic): Secondary | ICD-10-CM | POA: Insufficient documentation

## 2024-04-16 DIAGNOSIS — I780 Hereditary hemorrhagic telangiectasia: Secondary | ICD-10-CM | POA: Insufficient documentation

## 2024-04-16 LAB — CBC WITH DIFFERENTIAL (CANCER CENTER ONLY)
Abs Immature Granulocytes: 0.06 K/uL (ref 0.00–0.07)
Basophils Absolute: 0 K/uL (ref 0.0–0.1)
Basophils Relative: 0 %
Eosinophils Absolute: 0.1 K/uL (ref 0.0–0.5)
Eosinophils Relative: 0 %
HCT: 18 % — ABNORMAL LOW (ref 36.0–46.0)
Hemoglobin: 5.3 g/dL — CL (ref 12.0–15.0)
Immature Granulocytes: 0 %
Lymphocytes Relative: 7 %
Lymphs Abs: 1 K/uL (ref 0.7–4.0)
MCH: 25.2 pg — ABNORMAL LOW (ref 26.0–34.0)
MCHC: 29.4 g/dL — ABNORMAL LOW (ref 30.0–36.0)
MCV: 85.7 fL (ref 80.0–100.0)
Monocytes Absolute: 0.6 K/uL (ref 0.1–1.0)
Monocytes Relative: 4 %
Neutro Abs: 13.1 K/uL — ABNORMAL HIGH (ref 1.7–7.7)
Neutrophils Relative %: 89 %
Platelet Count: 303 K/uL (ref 150–400)
RBC: 2.1 MIL/uL — ABNORMAL LOW (ref 3.87–5.11)
RDW: 22.2 % — ABNORMAL HIGH (ref 11.5–15.5)
WBC Count: 14.9 K/uL — ABNORMAL HIGH (ref 4.0–10.5)
nRBC: 0 % (ref 0.0–0.2)

## 2024-04-16 LAB — IRON AND IRON BINDING CAPACITY (CC-WL,HP ONLY)
Iron: 16 ug/dL — ABNORMAL LOW (ref 28–170)
Saturation Ratios: 6 % — ABNORMAL LOW (ref 10.4–31.8)
TIBC: 279 ug/dL (ref 250–450)
UIBC: 263 ug/dL

## 2024-04-16 LAB — PREPARE RBC (CROSSMATCH)

## 2024-04-16 LAB — SAMPLE TO BLOOD BANK

## 2024-04-16 LAB — FERRITIN: Ferritin: 31 ng/mL (ref 11–307)

## 2024-04-16 NOTE — Progress Notes (Signed)
 Tried to call patient due to her having a critical lab of Hgb 5.3.  Rocky Mura RN from infusion called and stated that the patient needs to go to the ER to get blood now due to how low her hgb is. She will need to get 3 units of blood and we are not able for her to get this here.  Made Powell Lessen NP aware and she agreed. Tried to call patient no answer to call left a detailed message also called her son to see if he could get in touch with her. Called patient multiple times with no answer to any of the calls.

## 2024-04-16 NOTE — Telephone Encounter (Signed)
 Patient called and wanted to know since she has a nose bleed do she still need to keep her appointment.  Advised patient per John/CMA yes!  Patient verbalized understanding

## 2024-04-16 NOTE — Progress Notes (Signed)
 Verbal order given to administer 2 units of PRBC from Dr. Lanny, spoke with Jaclyn in BB type and screen order received, patient will have 2 units of PRBC transfused on 04/17/2024

## 2024-04-16 NOTE — Telephone Encounter (Signed)
 CRITICAL LAB VALUE REPORTED x Amber in the lab Hbg= 5.3 Provider notified  Patient is scheduled for Blood procedure tomorrow.

## 2024-04-16 NOTE — Telephone Encounter (Signed)
 Received telephone call from the patient confirming her appointment times.  Let patient know she is scheduled for port flush/lab today at 3:00pm and blood transfusion tomorrow, Saturday 04/17/24 at 8:00am. Patient voiced understanding.

## 2024-04-17 ENCOUNTER — Inpatient Hospital Stay

## 2024-04-18 ENCOUNTER — Ambulatory Visit: Payer: Self-pay | Admitting: Nurse Practitioner

## 2024-04-20 LAB — TYPE AND SCREEN
ABO/RH(D): A NEG
Antibody Screen: NEGATIVE
Unit division: 0
Unit division: 0

## 2024-04-20 LAB — BPAM RBC
Blood Product Expiration Date: 202601242359
Blood Product Expiration Date: 202601292359
Unit Type and Rh: 600
Unit Type and Rh: 600

## 2024-04-21 ENCOUNTER — Ambulatory Visit: Admitting: Internal Medicine

## 2024-04-21 ENCOUNTER — Encounter: Payer: Self-pay | Admitting: Internal Medicine

## 2024-04-21 ENCOUNTER — Other Ambulatory Visit: Payer: Self-pay

## 2024-04-21 ENCOUNTER — Ambulatory Visit: Payer: Self-pay | Admitting: Infectious Disease

## 2024-04-21 VITALS — BP 108/64 | HR 83 | Temp 98.4°F | Ht 64.0 in | Wt 229.0 lb

## 2024-04-21 DIAGNOSIS — M86671 Other chronic osteomyelitis, right ankle and foot: Secondary | ICD-10-CM

## 2024-04-21 DIAGNOSIS — S91001A Unspecified open wound, right ankle, initial encounter: Secondary | ICD-10-CM

## 2024-04-21 DIAGNOSIS — T8131XA Disruption of external operation (surgical) wound, not elsewhere classified, initial encounter: Secondary | ICD-10-CM

## 2024-04-21 MED ORDER — LINEZOLID 600 MG PO TABS: 600.0000 mg | ORAL_TABLET | Freq: Two times a day (BID) | ORAL | 0 refills | Status: DC

## 2024-04-21 NOTE — Progress Notes (Signed)
 "     Patient: Peggy House  DOB: December 22, 1960 MRN: [id number] PCP: Nelia Dirks, MD    Patient Active Problem List   Diagnosis Date Noted   AVM (arteriovenous malformation) of small bowel, acquired 03/06/2024   Melena 03/06/2024   Gastric ulcer without hemorrhage or perforation 03/06/2024   Gastric AVM 03/06/2024   Acute upper GI bleeding 03/04/2024   Epistaxis 12/27/2023   Generalized anxiety disorder 08/13/2023   Coping style affecting medical condition 08/13/2023   Closed trimalleolar fracture of right ankle 08/13/2023   Open wound of right ankle; discharged with wound vac 07/29/2023   Heart murmur, systolic 07/27/2023   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 07/13/2023   Thrombocytopenia 04/15/2023   Cholelithiasis 11/18/2022   Aortic atherosclerosis 11/18/2022   Tobacco use disorder 06/22/2022   Depression with anxiety 04/12/2022   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   Syncope 11/01/2020   Trapezius muscle spasm 07/22/2020   Leg pain, bilateral 09/28/2019   Urge incontinence 09/28/2019   Pain due to onychomycosis of toenails of both feet 02/26/2019   Diabetes mellitus (HCC) 02/26/2019   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   Anemia due to GI blood loss 12/07/2018   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   Acute blood loss anemia 12/03/2018   Hypokalemia    Port-A-Cath in place 09/18/2018   GAD (generalized anxiety disorder) 01/20/2018   Pulmonary artery hypertension (HCC) 10/19/2017   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   Venous stasis dermatitis of both lower extremities 05/07/2017   Anemia due to acute blood loss secondary to Epistaxis 11/29/2016   Iron  deficiency anemia 10/29/2016   Cough 05/13/2016   Osteoarthritis, multiple sites 06/05/2015   Insomnia 06/05/2015   Major depressive disorder, recurrent episode 06/05/2015   Obesity, Class III, BMI 40-49.9 (morbid  obesity) (HCC) 04/26/2014   HLD (hyperlipidemia) 04/25/2014   Essential hypertension      Subjective:  Peggy House is a 64 y.o. F with past medical history as below with anxiety/hypertension, obesity, hyperlipidemia, heart murmur, diabetes with vascular disease, history of infected right tibial process status post removal on 07/15/2023 with cultures growing MSSA treated with Ancef  through 08/25/2023 presents for ankle infection with orthopedics. She was last seen by infectious disease Dr. Eben on 08/27/2022 there was concern for lateral lower extremity wound placed on Keflex  and then given 1 month follow-up. - Patient had sustained ankle fracture several months ago and underwent open treatment with outside provider.  She states he had wound dehiscence the lateral aspect of her leg with hardware removal as above I&D and closure.  MRI showed evidence of osteomyelitis fibula and tibia.  Cultures grew corynebacterium.  Patient did not start linezolid .  Wound is draining.  She denies fevers or chills. Review of Systems  All other systems reviewed and are negative.   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   no  meds, diet controlled, does not check blood sugar    Outpatient Medications Prior to Visit  Medication Sig Dispense Refill   Accu-Chek Softclix Lancets lancets Use to check blood sugar before breakfast and before dinner while on steroids (Patient taking differently: 1 each by Other route See admin  instructions. Use to check blood sugar before breakfast and before dinner while on steroids) 100 each 1   acetaminophen  (TYLENOL ) 325 MG tablet Take 2 tablets (650 mg total) by mouth every 6 (six) hours as needed. 100 tablet 0   ALPRAZolam  (XANAX ) 0.5 MG tablet Take 0.5 mg by mouth at bedtime.     aminocaproic  acid (AMICAR ) 500 MG tablet Take 2 tablets (1,000 mg total) by mouth every 12 (twelve) hours. 120 tablet 3   amLODipine  (NORVASC ) 10 MG tablet Take 1 tablet (10 mg total) by mouth daily. 30 tablet 11   cefadroxil  (DURICEF) 500 MG capsule Take 1 capsule (500 mg total) by mouth 2 (two) times daily. 20 capsule 0   citalopram  (CELEXA ) 20 MG tablet TAKE 1 TABLET(20 MG) BY MOUTH DAILY (Patient taking differently: Take 20 mg by mouth daily as needed (for anxiety).) 90 tablet 3   citalopram  (CELEXA ) 20 MG tablet Take 1 tablet (20 mg total) by mouth daily. 90 tablet 2   gabapentin  (NEURONTIN ) 100 MG capsule Take 1 capsule (100 mg total) by mouth at bedtime. 90 capsule 3   glucose blood (ACCU-CHEK GUIDE) test strip Check blood sugar 2 times per day while on steroids before breakfast and dinner (Patient taking differently: 1 each by Other route See admin instructions. Check blood sugar 2 times per day while on steroids before breakfast and dinner) 50 each 3   leptospermum manuka honey (MEDIHONEY) PSTE paste Apply 1 Application topically daily. 15 mL 0   lidocaine  4 % Place 1 patch onto the skin daily. Remove & Discard patch within 12 hours or as directed by MD 30 patch 0   lisinopril  (ZESTRIL ) 20 MG tablet Take 1 tablet (20 mg total) by mouth daily. 30 tablet 3   oxyCODONE  (ROXICODONE ) 5 MG immediate release tablet Take 1 tablet (5 mg total) by mouth every 4 (four) hours as needed (for post op pain). 20 tablet 0   pantoprazole  (PROTONIX ) 40 MG tablet Take 1 tablet (40 mg total) by mouth 2 (two) times daily. TAKE 1 TABLET(40 MG) BY MOUTH TWICE DAILY 120 tablet 0   senna-docusate (SENOKOT-S) 8.6-50 MG tablet  Take 1 tablet by mouth at bedtime. 100 tablet 0   traZODone  (DESYREL ) 50 MG tablet TAKE 1 TABLET(50 MG) BY MOUTH AT BEDTIME AS NEEDED FOR SLEEP 30 tablet 0   traZODone  (DESYREL ) 50 MG tablet Take 1 tablet (50 mg total) by mouth at bedtime. 30 tablet 0   No facility-administered medications prior to visit.     Allergies[1]  Social History[2]  Family History  Problem Relation Age of Onset   Cancer Mother        Ovarian   Ovarian cancer Mother    Diabetes Mother    Kidney disease Mother    Bleeding Disorder Mother    Diabetes type II Sister    Bleeding Disorder Sister    Diabetes Sister    Colon cancer Maternal Grandfather    Crohn's disease Maternal Grandfather    Stomach cancer Maternal Grandmother    Kidney disease Son    Bleeding Disorder Son    Dysmenorrhea Neg Hx     Objective:  There were no vitals filed  for this visit. There is no height or weight on file to calculate BMI.  Physical Exam Constitutional:      Appearance: Normal appearance.  HENT:     Head: Normocephalic and atraumatic.     Right Ear: Tympanic membrane normal.     Left Ear: Tympanic membrane normal.     Nose: Nose normal.     Mouth/Throat:     Mouth: Mucous membranes are moist.  Eyes:     Extraocular Movements: Extraocular movements intact.     Conjunctiva/sclera: Conjunctivae normal.     Pupils: Pupils are equal, round, and reactive to light.  Cardiovascular:     Rate and Rhythm: Normal rate and regular rhythm.     Heart sounds: No murmur heard.    No friction rub. No gallop.  Pulmonary:     Effort: Pulmonary effort is normal.     Breath sounds: Normal breath sounds.  Abdominal:     General: Abdomen is flat.     Palpations: Abdomen is soft.  Skin:    General: Skin is warm and dry.  Neurological:     General: No focal deficit present.     Mental Status: She is alert and oriented to person, place, and time.  Psychiatric:        Mood and Affect: Mood normal.     Lab Results: Lab  Results  Component Value Date   WBC 14.9 (H) 04/16/2024   HGB 5.3 (LL) 04/16/2024   HCT 18.0 (L) 04/16/2024   MCV 85.7 04/16/2024   PLT 303 04/16/2024    Lab Results  Component Value Date   CREATININE 0.73 03/05/2024   BUN 21 03/05/2024   NA 139 03/05/2024   K 3.9 03/05/2024   CL 109 03/05/2024   CO2 21 (L) 03/05/2024    Lab Results  Component Value Date   ALT 7 03/05/2024   AST 11 (L) 03/05/2024   ALKPHOS 61 03/05/2024   BILITOT 0.7 03/05/2024     Assessment & Plan:  #Right ankle post op wound SP hw removal and I&D -Pt had LOC and presented after fall, underwent right ankle  -Course was complicated by ankle wound requiring debridement and hardware extraction in April 2025 OR cultures that time grew MSSA patient was given Ancef  through 08/25/2023.  She continued to have worsening wound drainage taken to the OR again on 03/16/2024 for revision and wound closure chronic surgical dehiscence, MRI  showed on osteomyelitis of fibula and tibia.  Reviewed OR note which noted draining sinus from lateral aspect fibula.  Or cultures grew corynebacterium.  Patient initially given cefadroxil  on 12/2 x 10 days.  Then on 04/16/2024 prescribed linezolid  x 2 weeks.  Patient did not start linezolid . -Wound is draining, copious foul odor.  I communicated with surgery noting that she needs repeat I&D.  Will we will do labs today.  If abnormal then send to ED. Start linezolid  x2 weeks (till eventual OR).  F?U with ID in 2 weeks Loney Stank, MD [facility]   04/21/2024  2:55 PM I personally spent a total of 95 minutes in the care of the patient today including preparing to see the patient, getting/reviewing separately obtained history, performing a medically appropriate exam/evaluation, counseling and educating, placing orders, referring and communicating with other health care professionals, documenting clinical information in the EHR, independently  interpreting results, and communicating results.      [1]  Allergies Allergen Reactions   Feraheme  [Ferumoxytol ]  Other (See Comments)    Muscle tetany, encephalopathy, fatigue, nausea (reaction to injection)   Nsaids Other (See Comments)    Stomach bleeding episodes; Tylenol  is OK   Wasp Venom Anaphylaxis   Tomato Hives   Iron  (Ferrous Sulfate ) [Ferrous Sulfate  Er] Other (See Comments)    Shock  [2]  Social History Tobacco Use   Smoking status: Every Day    Current packs/day: 0.50    Average packs/day: 0.5 packs/day for 30.0 years (15.0 ttl pk-yrs)    Types: Cigarettes   Smokeless tobacco: Former    Types: Snuff    Quit date: 20   Tobacco comments:    1/2 PPD per patient on 03/10/24  Vaping Use   Vaping status: Never Used  Substance Use Topics   Alcohol use: No    Alcohol/week: 0.0 standard drinks of alcohol   Drug use: Not Currently    Comment: last cocaine-2010   "

## 2024-04-21 NOTE — Patient Instructions (Signed)
 Start linezolid  Please follow-up with orthopedics F/U with ID in 2 weeks

## 2024-04-22 ENCOUNTER — Telehealth: Payer: Self-pay | Admitting: Internal Medicine

## 2024-04-22 ENCOUNTER — Telehealth: Payer: Self-pay

## 2024-04-22 ENCOUNTER — Encounter (HOSPITAL_COMMUNITY): Payer: Self-pay

## 2024-04-22 ENCOUNTER — Other Ambulatory Visit: Payer: Self-pay

## 2024-04-22 ENCOUNTER — Emergency Department (HOSPITAL_COMMUNITY)

## 2024-04-22 ENCOUNTER — Inpatient Hospital Stay

## 2024-04-22 ENCOUNTER — Encounter: Payer: Self-pay | Admitting: Hematology

## 2024-04-22 ENCOUNTER — Inpatient Hospital Stay (HOSPITAL_COMMUNITY)
Admission: EM | Admit: 2024-04-22 | Discharge: 2024-05-13 | DRG: 377 | Disposition: A | Discharge status: Skilled Nursing Facility | Source: Ambulatory Visit | Attending: Internal Medicine | Admitting: Internal Medicine

## 2024-04-22 DIAGNOSIS — F419 Anxiety disorder, unspecified: Secondary | ICD-10-CM | POA: Diagnosis not present

## 2024-04-22 DIAGNOSIS — E611 Iron deficiency: Secondary | ICD-10-CM | POA: Diagnosis present

## 2024-04-22 DIAGNOSIS — D62 Acute posthemorrhagic anemia: Secondary | ICD-10-CM | POA: Diagnosis present

## 2024-04-22 DIAGNOSIS — R040 Epistaxis: Secondary | ICD-10-CM | POA: Diagnosis not present

## 2024-04-22 DIAGNOSIS — M86271 Subacute osteomyelitis, right ankle and foot: Secondary | ICD-10-CM | POA: Diagnosis not present

## 2024-04-22 DIAGNOSIS — K31819 Angiodysplasia of stomach and duodenum without bleeding: Secondary | ICD-10-CM | POA: Diagnosis present

## 2024-04-22 DIAGNOSIS — Z8711 Personal history of peptic ulcer disease: Secondary | ICD-10-CM

## 2024-04-22 DIAGNOSIS — T8131XA Disruption of external operation (surgical) wound, not elsewhere classified, initial encounter: Secondary | ICD-10-CM | POA: Diagnosis present

## 2024-04-22 DIAGNOSIS — Z800 Family history of malignant neoplasm of digestive organs: Secondary | ICD-10-CM

## 2024-04-22 DIAGNOSIS — Z832 Family history of diseases of the blood and blood-forming organs and certain disorders involving the immune mechanism: Secondary | ICD-10-CM

## 2024-04-22 DIAGNOSIS — T17998A Other foreign object in respiratory tract, part unspecified causing other injury, initial encounter: Secondary | ICD-10-CM | POA: Diagnosis not present

## 2024-04-22 DIAGNOSIS — Z8041 Family history of malignant neoplasm of ovary: Secondary | ICD-10-CM

## 2024-04-22 DIAGNOSIS — E119 Type 2 diabetes mellitus without complications: Secondary | ICD-10-CM

## 2024-04-22 DIAGNOSIS — Z886 Allergy status to analgesic agent status: Secondary | ICD-10-CM

## 2024-04-22 DIAGNOSIS — K922 Gastrointestinal hemorrhage, unspecified: Secondary | ICD-10-CM | POA: Diagnosis not present

## 2024-04-22 DIAGNOSIS — E66813 Obesity, class 3: Secondary | ICD-10-CM | POA: Diagnosis present

## 2024-04-22 DIAGNOSIS — K219 Gastro-esophageal reflux disease without esophagitis: Secondary | ICD-10-CM | POA: Diagnosis present

## 2024-04-22 DIAGNOSIS — F418 Other specified anxiety disorders: Secondary | ICD-10-CM | POA: Diagnosis present

## 2024-04-22 DIAGNOSIS — Z6841 Body Mass Index (BMI) 40.0 and over, adult: Secondary | ICD-10-CM

## 2024-04-22 DIAGNOSIS — J9601 Acute respiratory failure with hypoxia: Secondary | ICD-10-CM | POA: Diagnosis not present

## 2024-04-22 DIAGNOSIS — Z91018 Allergy to other foods: Secondary | ICD-10-CM

## 2024-04-22 DIAGNOSIS — T84622A Infection and inflammatory reaction due to internal fixation device of right tibia, initial encounter: Secondary | ICD-10-CM | POA: Diagnosis present

## 2024-04-22 DIAGNOSIS — Z751 Person awaiting admission to adequate facility elsewhere: Secondary | ICD-10-CM

## 2024-04-22 DIAGNOSIS — K31811 Angiodysplasia of stomach and duodenum with bleeding: Secondary | ICD-10-CM

## 2024-04-22 DIAGNOSIS — J9562 Intraoperative hemorrhage and hematoma of a respiratory system organ or structure complicating other procedure: Secondary | ICD-10-CM | POA: Diagnosis not present

## 2024-04-22 DIAGNOSIS — B9561 Methicillin susceptible Staphylococcus aureus infection as the cause of diseases classified elsewhere: Secondary | ICD-10-CM | POA: Diagnosis present

## 2024-04-22 DIAGNOSIS — F17210 Nicotine dependence, cigarettes, uncomplicated: Secondary | ICD-10-CM | POA: Diagnosis present

## 2024-04-22 DIAGNOSIS — T84629A Infection and inflammatory reaction due to internal fixation device of unspecified bone of leg, initial encounter: Secondary | ICD-10-CM | POA: Diagnosis present

## 2024-04-22 DIAGNOSIS — I780 Hereditary hemorrhagic telangiectasia: Secondary | ICD-10-CM | POA: Diagnosis present

## 2024-04-22 DIAGNOSIS — I10 Essential (primary) hypertension: Secondary | ICD-10-CM | POA: Diagnosis present

## 2024-04-22 DIAGNOSIS — Z888 Allergy status to other drugs, medicaments and biological substances status: Secondary | ICD-10-CM

## 2024-04-22 DIAGNOSIS — F411 Generalized anxiety disorder: Secondary | ICD-10-CM | POA: Diagnosis present

## 2024-04-22 DIAGNOSIS — G8929 Other chronic pain: Secondary | ICD-10-CM | POA: Diagnosis present

## 2024-04-22 DIAGNOSIS — K5520 Angiodysplasia of colon without hemorrhage: Secondary | ICD-10-CM | POA: Diagnosis present

## 2024-04-22 DIAGNOSIS — G4700 Insomnia, unspecified: Secondary | ICD-10-CM | POA: Diagnosis present

## 2024-04-22 DIAGNOSIS — K5521 Angiodysplasia of colon with hemorrhage: Secondary | ICD-10-CM | POA: Diagnosis present

## 2024-04-22 DIAGNOSIS — M869 Osteomyelitis, unspecified: Secondary | ICD-10-CM | POA: Diagnosis present

## 2024-04-22 DIAGNOSIS — D638 Anemia in other chronic diseases classified elsewhere: Secondary | ICD-10-CM | POA: Diagnosis present

## 2024-04-22 DIAGNOSIS — D649 Anemia, unspecified: Secondary | ICD-10-CM | POA: Diagnosis not present

## 2024-04-22 DIAGNOSIS — E785 Hyperlipidemia, unspecified: Secondary | ICD-10-CM | POA: Diagnosis present

## 2024-04-22 DIAGNOSIS — Z9911 Dependence on respirator [ventilator] status: Secondary | ICD-10-CM

## 2024-04-22 DIAGNOSIS — Z7401 Bed confinement status: Secondary | ICD-10-CM

## 2024-04-22 DIAGNOSIS — R7989 Other specified abnormal findings of blood chemistry: Secondary | ICD-10-CM | POA: Diagnosis present

## 2024-04-22 DIAGNOSIS — Z8419 Family history of other disorders of kidney and ureter: Secondary | ICD-10-CM

## 2024-04-22 DIAGNOSIS — D500 Iron deficiency anemia secondary to blood loss (chronic): Secondary | ICD-10-CM | POA: Diagnosis not present

## 2024-04-22 DIAGNOSIS — Z833 Family history of diabetes mellitus: Secondary | ICD-10-CM

## 2024-04-22 DIAGNOSIS — Z79899 Other long term (current) drug therapy: Secondary | ICD-10-CM

## 2024-04-22 DIAGNOSIS — Z90710 Acquired absence of both cervix and uterus: Secondary | ICD-10-CM

## 2024-04-22 DIAGNOSIS — M86661 Other chronic osteomyelitis, right tibia and fibula: Secondary | ICD-10-CM | POA: Diagnosis present

## 2024-04-22 DIAGNOSIS — K3189 Other diseases of stomach and duodenum: Secondary | ICD-10-CM | POA: Diagnosis not present

## 2024-04-22 DIAGNOSIS — Z91199 Patient's noncompliance with other medical treatment and regimen due to unspecified reason: Secondary | ICD-10-CM

## 2024-04-22 DIAGNOSIS — F32A Depression, unspecified: Secondary | ICD-10-CM | POA: Diagnosis present

## 2024-04-22 DIAGNOSIS — D509 Iron deficiency anemia, unspecified: Secondary | ICD-10-CM | POA: Diagnosis present

## 2024-04-22 DIAGNOSIS — K639 Disease of intestine, unspecified: Secondary | ICD-10-CM

## 2024-04-22 DIAGNOSIS — E1169 Type 2 diabetes mellitus with other specified complication: Secondary | ICD-10-CM | POA: Diagnosis present

## 2024-04-22 DIAGNOSIS — D696 Thrombocytopenia, unspecified: Secondary | ICD-10-CM | POA: Diagnosis present

## 2024-04-22 DIAGNOSIS — T84629D Infection and inflammatory reaction due to internal fixation device of unspecified bone of leg, subsequent encounter: Secondary | ICD-10-CM

## 2024-04-22 DIAGNOSIS — D372 Neoplasm of uncertain behavior of small intestine: Secondary | ICD-10-CM | POA: Diagnosis present

## 2024-04-22 DIAGNOSIS — M86161 Other acute osteomyelitis, right tibia and fibula: Secondary | ICD-10-CM | POA: Diagnosis not present

## 2024-04-22 DIAGNOSIS — Z79620 Long term (current) use of immunosuppressive biologic: Secondary | ICD-10-CM

## 2024-04-22 LAB — COMPLETE METABOLIC PANEL WITHOUT GFR
AG Ratio: 1.5 (calc) (ref 1.0–2.5)
ALT: 6 U/L (ref 6–29)
AST: 9 U/L — ABNORMAL LOW (ref 10–35)
Albumin: 3.7 g/dL (ref 3.6–5.1)
Alkaline phosphatase (APISO): 84 U/L (ref 37–153)
BUN: 9 mg/dL (ref 7–25)
CO2: 23 mmol/L (ref 20–32)
Calcium: 8.4 mg/dL — ABNORMAL LOW (ref 8.6–10.4)
Chloride: 110 mmol/L (ref 98–110)
Creat: 0.86 mg/dL (ref 0.50–1.05)
Globulin: 2.4 g/dL (ref 1.9–3.7)
Glucose, Bld: 89 mg/dL (ref 65–99)
Potassium: 3.9 mmol/L (ref 3.5–5.3)
Sodium: 141 mmol/L (ref 135–146)
Total Bilirubin: 0.2 mg/dL (ref 0.2–1.2)
Total Protein: 6.1 g/dL (ref 6.1–8.1)

## 2024-04-22 LAB — CBC WITH DIFFERENTIAL/PLATELET
Abs Immature Granulocytes: 0.05 K/uL (ref 0.00–0.07)
Absolute Lymphocytes: 1156 {cells}/uL (ref 850–3900)
Absolute Monocytes: 519 {cells}/uL (ref 200–950)
Basophils Absolute: 35 {cells}/uL (ref 0–200)
Basophils Absolute: 0 K/uL (ref 0.0–0.1)
Basophils Relative: 0.3 %
Basophils Relative: 0 %
Eosinophils Absolute: 71 {cells}/uL (ref 15–500)
Eosinophils Absolute: 0.1 K/uL (ref 0.0–0.5)
Eosinophils Relative: 0.6 %
Eosinophils Relative: 1 %
HCT: 15 % — ABNORMAL LOW (ref 35.9–46.0)
HCT: 16.1 % — ABNORMAL LOW (ref 36.0–46.0)
Hemoglobin: 4.1 g/dL — CL (ref 11.7–15.5)
Hemoglobin: 4.5 g/dL — CL (ref 12.0–15.0)
Immature Granulocytes: 1 %
Lymphocytes Relative: 9 %
Lymphs Abs: 1 K/uL (ref 0.7–4.0)
MCH: 23.7 pg — ABNORMAL LOW (ref 27.0–33.0)
MCH: 24.9 pg — ABNORMAL LOW (ref 26.0–34.0)
MCHC: 27.3 g/dL — ABNORMAL LOW (ref 31.6–35.4)
MCHC: 28 g/dL — ABNORMAL LOW (ref 30.0–36.0)
MCV: 86.7 fL (ref 81.4–101.7)
MCV: 89 fL (ref 80.0–100.0)
MPV: 9.2 fL (ref 7.5–12.5)
Monocytes Absolute: 0.3 K/uL (ref 0.1–1.0)
Monocytes Relative: 4.4 %
Monocytes Relative: 3 %
Neutro Abs: 10018 {cells}/uL — ABNORMAL HIGH (ref 1500–7800)
Neutro Abs: 9.1 K/uL — ABNORMAL HIGH (ref 1.7–7.7)
Neutrophils Relative %: 84.9 %
Neutrophils Relative %: 86 %
Platelets: 354 Thousand/uL (ref 140–400)
Platelets: 371 K/uL (ref 150–400)
RBC: 1.73 Million/uL — ABNORMAL LOW (ref 3.80–5.10)
RBC: 1.81 MIL/uL — ABNORMAL LOW (ref 3.87–5.11)
RDW: 18.9 % — ABNORMAL HIGH (ref 11.0–15.0)
RDW: 21.2 % — ABNORMAL HIGH (ref 11.5–15.5)
Total Lymphocyte: 9.8 %
WBC: 11.8 Thousand/uL — ABNORMAL HIGH (ref 3.8–10.8)
WBC: 10.6 K/uL — ABNORMAL HIGH (ref 4.0–10.5)
nRBC: 0.3 % — ABNORMAL HIGH (ref 0.0–0.2)

## 2024-04-22 LAB — COMPREHENSIVE METABOLIC PANEL WITH GFR
ALT: 5 U/L (ref 0–44)
AST: 11 U/L — ABNORMAL LOW (ref 15–41)
Albumin: 3.8 g/dL (ref 3.5–5.0)
Alkaline Phosphatase: 92 U/L (ref 38–126)
Anion gap: 10 (ref 5–15)
BUN: 9 mg/dL (ref 8–23)
CO2: 24 mmol/L (ref 22–32)
Calcium: 8.7 mg/dL — ABNORMAL LOW (ref 8.9–10.3)
Chloride: 110 mmol/L (ref 98–111)
Creatinine, Ser: 0.81 mg/dL (ref 0.44–1.00)
GFR, Estimated: >60 mL/min
Glucose, Bld: 147 mg/dL — ABNORMAL HIGH (ref 70–99)
Potassium: 3.8 mmol/L (ref 3.5–5.1)
Sodium: 143 mmol/L (ref 135–145)
Total Bilirubin: <0.2 mg/dL (ref 0.0–1.2)
Total Protein: 6.2 g/dL — ABNORMAL LOW (ref 6.5–8.1)

## 2024-04-22 LAB — IRON AND TIBC
Iron: 11 ug/dL — ABNORMAL LOW (ref 28–170)
Saturation Ratios: 4 % — ABNORMAL LOW (ref 10.4–31.8)
TIBC: 288 ug/dL (ref 250–450)
UIBC: 278 ug/dL

## 2024-04-22 LAB — FERRITIN: Ferritin: 23 ng/mL (ref 11–307)

## 2024-04-22 LAB — PROTIME-INR
INR: 1.1 (ref 0.8–1.2)
Prothrombin Time: 15 s (ref 11.4–15.2)

## 2024-04-22 LAB — RETICULOCYTES
Immature Retic Fract: 10.1 % (ref 2.3–15.9)
RBC.: 1.94 MIL/uL — ABNORMAL LOW (ref 3.87–5.11)
Retic Count, Absolute: 48.3 K/uL (ref 19.0–186.0)
Retic Ct Pct: 2.5 % (ref 0.4–3.1)

## 2024-04-22 LAB — FOLATE: Folate: 8 ng/mL

## 2024-04-22 LAB — PREPARE RBC (CROSSMATCH)

## 2024-04-22 LAB — HEMOGLOBIN AND HEMATOCRIT, BLOOD
HCT: 16.7 % — ABNORMAL LOW (ref 36.0–46.0)
Hemoglobin: 4.8 g/dL — CL (ref 12.0–15.0)

## 2024-04-22 LAB — VITAMIN B12: Vitamin B-12: 460 pg/mL (ref 180–914)

## 2024-04-22 LAB — MAGNESIUM: Magnesium: 2.1 mg/dL (ref 1.7–2.4)

## 2024-04-22 LAB — PHOSPHORUS: Phosphorus: 2.9 mg/dL (ref 2.5–4.6)

## 2024-04-22 LAB — CBC MORPHOLOGY

## 2024-04-22 LAB — POC OCCULT BLOOD, ED: Fecal Occult Bld: POSITIVE — AB

## 2024-04-22 MED ORDER — SODIUM CHLORIDE 0.9% IV SOLUTION: Freq: Once | INTRAVENOUS | Status: AC

## 2024-04-22 MED ORDER — FLEET ENEMA RE ENEM: 1.0000 | ENEMA | Freq: Once | RECTAL | Status: DC | PRN

## 2024-04-22 MED ORDER — HYDRALAZINE HCL 20 MG/ML IJ SOLN: 10.0000 mg | INTRAMUSCULAR | Status: DC | PRN

## 2024-04-22 MED ORDER — IPRATROPIUM BROMIDE 0.02 % IN SOLN
0.5000 mg | Freq: Four times a day (QID) | RESPIRATORY_TRACT | Status: DC | PRN
  Administered 2024-04-24: 0.5 mg via RESPIRATORY_TRACT
  Filled 2024-04-22: qty 2.5

## 2024-04-22 MED ORDER — LISINOPRIL 20 MG PO TABS
20.0000 mg | ORAL_TABLET | Freq: Every day | ORAL | Status: DC
  Administered 2024-04-22 – 2024-04-23 (×2): 20 mg via ORAL
  Filled 2024-04-22 (×2): qty 1

## 2024-04-22 MED ORDER — OXYMETAZOLINE HCL 0.05 % NA SOLN: 1.0000 | Freq: Two times a day (BID) | NASAL | Status: AC | PRN

## 2024-04-22 MED ORDER — ALPRAZOLAM 0.5 MG PO TABS
0.5000 mg | ORAL_TABLET | Freq: Every day | ORAL | Status: DC
  Administered 2024-04-23: 0.5 mg via ORAL
  Filled 2024-04-22: qty 1

## 2024-04-22 MED ORDER — SODIUM CHLORIDE 0.9% FLUSH
3.0000 mL | Freq: Two times a day (BID) | INTRAVENOUS | Status: DC
  Administered 2024-04-22 – 2024-05-12 (×32): 3 mL via INTRAVENOUS

## 2024-04-22 MED ORDER — HEPARIN SODIUM (PORCINE) 5000 UNIT/ML IJ SOLN: 5000.0000 [IU] | Freq: Three times a day (TID) | INTRAMUSCULAR | Status: DC

## 2024-04-22 MED ORDER — ONDANSETRON HCL 4 MG/2ML IJ SOLN: 4.0000 mg | Freq: Four times a day (QID) | INTRAMUSCULAR | Status: DC | PRN

## 2024-04-22 MED ORDER — SENNOSIDES-DOCUSATE SODIUM 8.6-50 MG PO TABS: 1.0000 | ORAL_TABLET | Freq: Every evening | ORAL | Status: DC | PRN

## 2024-04-22 MED ORDER — PANTOPRAZOLE SODIUM 40 MG IV SOLR
40.0000 mg | Freq: Once | INTRAVENOUS | Status: AC
  Administered 2024-04-22: 40 mg via INTRAVENOUS
  Filled 2024-04-22: qty 10

## 2024-04-22 MED ORDER — HYDROMORPHONE HCL 1 MG/ML IJ SOLN
0.5000 mg | INTRAMUSCULAR | Status: DC | PRN
  Administered 2024-04-24 – 2024-05-06 (×51): 1 mg via INTRAVENOUS
  Filled 2024-04-22 (×51): qty 1

## 2024-04-22 MED ORDER — AMLODIPINE BESYLATE 5 MG PO TABS
10.0000 mg | ORAL_TABLET | Freq: Every day | ORAL | Status: DC
  Administered 2024-04-22 – 2024-05-13 (×22): 10 mg via ORAL
  Filled 2024-04-22 (×3): qty 1
  Filled 2024-04-22 (×2): qty 2
  Filled 2024-04-22: qty 1
  Filled 2024-04-22: qty 2
  Filled 2024-04-22 (×3): qty 1
  Filled 2024-04-22 (×3): qty 2
  Filled 2024-04-22: qty 1
  Filled 2024-04-22: qty 2
  Filled 2024-04-22 (×2): qty 1
  Filled 2024-04-22: qty 2
  Filled 2024-04-22 (×4): qty 1

## 2024-04-22 MED ORDER — ACETAMINOPHEN 650 MG RE SUPP: 650.0000 mg | Freq: Four times a day (QID) | RECTAL | Status: DC | PRN

## 2024-04-22 MED ORDER — PANTOPRAZOLE SODIUM 40 MG PO TBEC: 40.0000 mg | DELAYED_RELEASE_TABLET | Freq: Every day | ORAL | Status: DC

## 2024-04-22 MED ORDER — LINEZOLID 600 MG PO TABS
600.0000 mg | ORAL_TABLET | Freq: Two times a day (BID) | ORAL | Status: DC
  Administered 2024-04-22 – 2024-04-23 (×2): 600 mg via ORAL
  Filled 2024-04-22 (×3): qty 1

## 2024-04-22 MED ORDER — MEDIHONEY WOUND/BURN DRESSING EX PSTE
1.0000 | PASTE | Freq: Every day | CUTANEOUS | Status: DC
  Filled 2024-04-22: qty 44

## 2024-04-22 MED ORDER — DIPHENHYDRAMINE HCL 25 MG PO CAPS
50.0000 mg | ORAL_CAPSULE | Freq: Once | ORAL | Status: AC
  Administered 2024-04-22: 50 mg via ORAL
  Filled 2024-04-22: qty 2

## 2024-04-22 MED ORDER — SODIUM CHLORIDE 0.9% FLUSH
3.0000 mL | Freq: Two times a day (BID) | INTRAVENOUS | Status: DC
  Administered 2024-04-22 – 2024-05-12 (×28): 3 mL via INTRAVENOUS

## 2024-04-22 MED ORDER — TRAZODONE HCL 50 MG PO TABS
25.0000 mg | ORAL_TABLET | Freq: Every evening | ORAL | Status: DC | PRN
  Administered 2024-04-24 – 2024-05-13 (×17): 25 mg via ORAL
  Filled 2024-04-22 (×17): qty 1

## 2024-04-22 MED ORDER — BISACODYL 5 MG PO TBEC: 5.0000 mg | DELAYED_RELEASE_TABLET | Freq: Every day | ORAL | Status: DC | PRN

## 2024-04-22 MED ORDER — SODIUM CHLORIDE 0.9 % IV SOLN: INTRAVENOUS | Status: AC

## 2024-04-22 MED ORDER — ONDANSETRON HCL 4 MG PO TABS: 4.0000 mg | ORAL_TABLET | Freq: Four times a day (QID) | ORAL | Status: DC | PRN

## 2024-04-22 MED ORDER — OXYCODONE HCL 5 MG PO TABS
5.0000 mg | ORAL_TABLET | ORAL | Status: DC | PRN
  Administered 2024-04-22 – 2024-05-06 (×15): 5 mg via ORAL
  Filled 2024-04-22 (×15): qty 1

## 2024-04-22 MED ORDER — OXYMETAZOLINE HCL 0.05 % NA SOLN
1.0000 | Freq: Once | NASAL | Status: AC
  Administered 2024-04-22: 1 via NASAL

## 2024-04-22 MED ORDER — ACETAMINOPHEN 325 MG PO TABS: 650.0000 mg | ORAL_TABLET | Freq: Four times a day (QID) | ORAL | Status: DC | PRN

## 2024-04-22 MED ORDER — CITALOPRAM HYDROBROMIDE 10 MG PO TABS
20.0000 mg | ORAL_TABLET | Freq: Every day | ORAL | Status: DC
  Administered 2024-04-22 – 2024-05-13 (×22): 20 mg via ORAL
  Filled 2024-04-22: qty 2
  Filled 2024-04-22 (×2): qty 1
  Filled 2024-04-22: qty 2
  Filled 2024-04-22 (×5): qty 1
  Filled 2024-04-22 (×2): qty 2
  Filled 2024-04-22: qty 1
  Filled 2024-04-22 (×2): qty 2
  Filled 2024-04-22 (×3): qty 1
  Filled 2024-04-22 (×2): qty 2
  Filled 2024-04-22 (×3): qty 1

## 2024-04-22 NOTE — Assessment & Plan Note (Signed)
 Few episode of epistaxis the past 2 to 3 days, Currently stable, -Platelets normal at 371

## 2024-04-22 NOTE — Assessment & Plan Note (Signed)
 With severe anemia, monitoring for hypotension -Once stable, continue home medication: Norvasc , lisinopril  -

## 2024-04-22 NOTE — Assessment & Plan Note (Addendum)
 Symptomatic anemia - Anemia of chronic disease with acute GI bleed Extensive history of iron  deficiency anemia, with history of thrombocytopenia, and hereditary hemorrhagic telangiectasia (HHT)  Also chronic epistaxis and GI Bleeding from AVM  -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, large amount blood found - Iron  studies total iron  16, TIBC 279, ferritin 31 -on weekly ferric gluconate 250mg , she is not very compliant   Today 04/22/2024  - Workup reveals Hemoccult positive x 2 -Pursuing with 3U PRBC blood transfusion - GI consulted appreciate follow-up

## 2024-04-22 NOTE — ED Triage Notes (Signed)
 Pt referred to ED from cancer center for Hgb of 4.1

## 2024-04-22 NOTE — Assessment & Plan Note (Addendum)
 Previous prolonged hospitalization due to HET, and GI bleed,  S/p embolization of the gastric artery branch on 12/10/2017, cauterization of stomach and 12/03/2018, epinephrine  injection into her ulcer on 11/01/2020, Frequent blood transfusion, with iron ,and bevacizumab 

## 2024-04-22 NOTE — Hospital Course (Addendum)
 64 y.o. F with hereditary telangiectasias, hx AVMs, iron  deficiency anemia, DM, obesity and chronic osteomyelitis of the right fibula who presented with weakness, nosebleed and melena. Admitted and found to have Hgb 4.1.

## 2024-04-22 NOTE — Assessment & Plan Note (Addendum)
 Occult positive x 2 -Hemoglobin at 4.1, 4.5 (Baseline hemoglobin around 8) -Agree with transfusing 3 units PRBC -Will check an H&H every 6 hours -Gastroenterologist consulted, appreciate further evaluation recommendation

## 2024-04-22 NOTE — Assessment & Plan Note (Signed)
 Per electronic medical record diabetes mellitus -She is not on any diabetic medications Last A1c was 5.0 a year ago -Will monitor

## 2024-04-22 NOTE — Assessment & Plan Note (Signed)
 History of thrombocytopenia  currently platelets are stable at 303, 354, 371 today Monitoring

## 2024-04-22 NOTE — Telephone Encounter (Signed)
 Called pt, left VM to call back Hgb 4.1, needs ot go tot ed fro transfusion

## 2024-04-22 NOTE — ED Notes (Signed)
Floor notified.

## 2024-04-22 NOTE — ED Triage Notes (Signed)
 C/O [facility] for a couple of days. Denies dizziness. Denies rectal/vaginal bleeding.

## 2024-04-22 NOTE — Consult Note (Addendum)
 "                                               Consultation Note   Referring Provider:   [facility] PCP: Nooruddin, Saad, MD Primary Gastroenterologist:   Sampson.  Dismissed from [facility]       Reason for Consultation: Dark stool, recurrent anemia DOA: 04/22/2024         Hospital Day: 1   Assessment and Plan:  64 year old female with a history of recurrent gastrointestinal bleeding , PUD , AVM disease of the stomach, small bowel and colon  treated on multiple occasions with APC and as recently as late November 2025. Suspect GI bleed secondary to AVMs 3 units of PBC's have already been ordered Patient will need repeat EGD for possible retreatment of gastric AVMs.  Also, it has been a few years since small bowel burden of AVMs has been evaluated.  Will plan for small bowel video capsule study to be dropped at the time of endoscopy tomorrow. Schedule for EGD. The risks and benefits of EGD with possible biopsies were discussed with the patient who agrees to proceed.  Clear liquid diet, n.p.o. after midnight  GERD Takes pantoprazole  at home Will continue home daily pantoprazole   Hereditary hemorrhagic telangiectasia ( HHT)  Hypertension  Generalized anxiety disorder  Osteomyelitis right fibula   / tibia .   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  She has history of gastric ulcers and AVM disease of the stomach, small bowel and colon and has undergone multiple endoscopic evaluations with treatment of these AVMs.  Most recently, patient was hospitalized in late November 2025 with dark stools and hemoglobin in the 4 range.  EGD 03/09/2024 remarkable for 11 nonbleeding angioectasias in the stomach treated with APC.  Also nonbleeding gastric ulcers with pigmented material.  There were 3 nonbleeding angioectasias in the duodenum, also treated with APC.  Patient was transfused, labs in December show hemoglobin of 7.7.  Patient receives IV iron  infusions through hematology.    Interval History:  Patient has a infected right ankle .  She is followed outpatient by infectious disease .  Yesterday labs done at infectious disease office remarkable for another significant drop in her hemoglobin and patient sent to ED. Patient has passed dark stools about 3 times this week .  She did not think a lot of it.  She has not particularly been short of breath.  No associated nausea, vomiting or abdominal pain. No NSAID use.  She is not anticoagulated  Admission workup notable for hemoglobin of 4.1, MCV 86, BUN 9, ferritin 23   Recent Labs    04/21/24 1532 04/22/24 1326  PROT 6.1 6.2*  ALBUMIN  --  3.8  AST 9* 11*  ALT 6 5  ALKPHOS  --  92  BILITOT 0.2 <0.2   Recent Labs    04/21/24 1532 04/22/24 1326  WBC 11.8* 10.6*  HGB 4.1* 4.5*  HCT 15.0* 16.1*  MCV 86.7 89.0  PLT 354 371   Recent Labs    04/21/24 1532 04/22/24 1326  NA 141 143  K 3.9 3.8  CL 110 110  CO2 23 24  GLUCOSE 89 147*  BUN 9 9  CREATININE 0.86 0.81  CALCIUM  8.4* 8.7*     DG Chest 2 View CLINICAL DATA:  Shortness of breath  EXAM: CHEST - 2 VIEW  COMPARISON:  March 04, 2024  FINDINGS: Stable cardiomegaly. Right internal jugular port is unchanged. Both lungs are clear. The visualized skeletal structures  are unremarkable.  IMPRESSION: No active cardiopulmonary disease.  Electronically Signed   By: Lynwood Landy Raddle M.D.   On: 04/22/2024 14:08   Review of Systems: Positive for recent episodes of epistaxis .  All systems reviewed and negative except where noted in HPI.  Physical Exam: Vital signs in last 24 hours: Temp:  [98.7 F (37.1 C)-98.8 F (37.1 C)] 98.7 F (37.1 C) (01/08 1531) Pulse Rate:  [72-81] 72 (01/08 1531) Resp:  [18-20] 18 (01/08 1531) BP: (130-161)/(61-66) 161/66 (01/08 1531) SpO2:  [100 %] 100 % (01/08 1531) Weight:  [101.2 kg] 101.2 kg (01/08 1255)   General:  Pleasant female in NAD Psych:  Cooperative. Normal mood and affect Eyes: Pupils equal Ears:  Normal auditory acuity Nose: No deformity, discharge or lesions Neck:  Supple, no masses felt Lungs:  Clear to auscultation.  Heart:  Regular rate, regular rhythm.  Loud heart murmur is present Abdomen:  Soft, nondistended, nontender, active bowel sounds, no masses felt Rectal :  Deferred Msk: Symmetrical without gross deformities.  Neurologic:  Alert, oriented, grossly normal neurologically Extremities : No edema Skin:  Intact without significant lesions.   OUTPATIENT MEDICATIONS Prior to Admission medications  Medication Sig Start Date End Date Taking? Authorizing Provider  acetaminophen  (TYLENOL ) 325 MG tablet Take 2 tablets (650 mg total) by mouth every 6 (six) hours as needed. Patient taking differently: Take 650 mg by mouth every 6 (six) hours as needed for mild pain (pain score 1-3). 05/19/23  Yes Raulkar, Sven SQUIBB, MD  ALPRAZolam  (XANAX ) 0.5 MG tablet Take 0.5 mg by mouth at bedtime.   Yes [provider]  amLODipine  (NORVASC ) 10 MG tablet Take 1 tablet (10 mg total) by mouth daily. 06/10/23 06/09/24 Yes Gregary Sharper, MD  citalopram  (CELEXA ) 20 MG tablet Take 1 tablet (20 mg total) by mouth daily. 08/27/23  Yes Nooruddin, Saad, MD  gabapentin  (NEURONTIN ) 100 MG capsule Take 1 capsule (100  mg total) by mouth at bedtime. Patient taking differently: Take 100 mg by mouth daily as needed (acid reflux). 07/04/23  Yes Raulkar, Sven SQUIBB, MD  leptospermum manuka honey (MEDIHONEY) PSTE paste Apply 1 Application topically daily. 12/28/23  Yes Tobie Gaines, DO  linezolid  (ZYVOX ) 600 MG tablet Take 1 tablet (600 mg total) by mouth 2 (two) times daily for 14 days. 04/21/24 05/05/24 Yes Dennise Kingsley, MD  lisinopril  (ZESTRIL ) 20 MG tablet Take 1 tablet (20 mg total) by mouth daily. 06/23/23  Yes Nooruddin, Saad, MD  oxyCODONE  (ROXICODONE ) 5 MG immediate release tablet Take 1 tablet (5 mg total) by mouth every 4 (four) hours as needed (for post op pain). 03/16/24  Yes Jordan, Jesse J, PA-C  pantoprazole  (PROTONIX ) 40 MG tablet  Take 1 tablet (40 mg total) by mouth 2 (two) times daily. TAKE 1 TABLET(40 MG) BY MOUTH TWICE DAILY 03/07/24 05/06/24 Yes Leotis Bogus, MD  traZODone  (DESYREL ) 50 MG tablet Take 1 tablet (50 mg total) by mouth at bedtime. Patient taking differently: Take 50 mg by mouth at bedtime as needed for sleep. 03/18/24  Yes Nooruddin, Saad, MD  aminocaproic  acid (AMICAR ) 500 MG tablet Take 2 tablets (1,000 mg total) by mouth every 12 (twelve) hours. Patient not taking: No sig reported 02/18/24   Lanny Callander, MD  cefadroxil  (DURICEF) 500 MG capsule Take 1 capsule (500 mg total) by mouth 2 (two) times daily. Patient not taking: Reported on 04/22/2024 03/16/24   Jordan, Jesse J, PA-C  lidocaine  4 % Place 1 patch onto the skin daily. Remove & Discard patch within 12 hours or as directed by MD Patient not taking: No sig reported 12/27/23   Tobie Gaines, DO    Allergies as of 04/22/2024 - Review Complete 04/22/2024  Allergen Reaction Noted   Feraheme  [ferumoxytol ] Other (See Comments) 10/09/2017   Nsaids Other (See Comments) 04/25/2014   Wasp venom Anaphylaxis 10/06/2017   Tomato Hives 01/01/2012   Iron  (ferrous sulfate ) [ferrous sulfate  er] Other (See Comments) 03/07/2020    INPATIENT  MEDICATIONS Current Facility-Administered Medications  Medication Dose Route Frequency Provider Last Rate Last Admin   0.9 %  sodium chloride  infusion (Manually program via Guardrails IV Fluids)   Intravenous Once Barrett, Jamie N, PA-C       0.9 %  sodium chloride  infusion   Intravenous Continuous Shahmehdi, Seyed A, MD       acetaminophen  (TYLENOL ) tablet 650 mg  650 mg Oral Q6H PRN Shahmehdi, Seyed A, MD       Or   acetaminophen  (TYLENOL ) suppository 650 mg  650 mg Rectal Q6H PRN Shahmehdi, Seyed A, MD       ALPRAZolam  (XANAX ) tablet 0.5 mg  0.5 mg Oral QHS Shahmehdi, Seyed A, MD       amLODipine  (NORVASC ) tablet 10 mg  10 mg Oral Daily Shahmehdi, Seyed A, MD       bisacodyl  (DULCOLAX) EC tablet 5 mg  5 mg Oral Daily PRN Shahmehdi, Seyed A, MD       citalopram  (CELEXA ) tablet 20 mg  20 mg Oral Daily Shahmehdi, Seyed A, MD       diphenhydrAMINE  (BENADRYL ) capsule 50 mg  50 mg Oral Once Barrett, Jamie N, PA-C       hydrALAZINE  (APRESOLINE ) injection 10 mg  10 mg Intravenous Q4H PRN Shahmehdi, Seyed A, MD       HYDROmorphone  (DILAUDID ) injection 0.5-1 mg  0.5-1 mg Intravenous Q2H PRN Shahmehdi, Seyed A, MD       ipratropium (ATROVENT ) nebulizer solution 0.5 mg  0.5 mg Nebulization Q6H PRN Shahmehdi, Seyed A, MD       leptospermum manuka honey (MEDIHONEY) paste 1 Application  1 Application Topical Daily Shahmehdi, Seyed A, MD       linezolid  (ZYVOX ) tablet 600 mg  600 mg Oral BID Shahmehdi, Seyed A, MD       lisinopril  (ZESTRIL ) tablet 20 mg  20 mg Oral Daily Shahmehdi, Seyed A, MD       ondansetron  (ZOFRAN ) tablet 4 mg  4 mg Oral Q6H PRN Shahmehdi, Seyed A, MD       Or   ondansetron  (ZOFRAN ) injection 4 mg  4 mg Intravenous Q6H PRN Shahmehdi, Seyed A, MD       oxyCODONE  (Oxy IR/ROXICODONE )  immediate release tablet 5 mg  5 mg Oral Q4H PRN Shahmehdi, Seyed A, MD       pantoprazole  (PROTONIX ) injection 40 mg  40 mg Intravenous Once Barrett, Jamie N, PA-C       senna-docusate (Senokot-S) tablet 1  tablet  1 tablet Oral QHS PRN Shahmehdi, Seyed A, MD       sodium chloride  flush (NS) 0.9 % injection 3 mL  3 mL Intravenous Q12H Shahmehdi, Seyed A, MD       sodium chloride  flush (NS) 0.9 % injection 3 mL  3 mL Intravenous Q12H Shahmehdi, Seyed A, MD       sodium phosphate  (FLEET) enema 1 enema  1 enema Rectal Once PRN Shahmehdi, Seyed A, MD       traZODone  (DESYREL ) tablet 25 mg  25 mg Oral QHS PRN Shahmehdi, Adriana LABOR, MD       Current Outpatient Medications  Medication Sig Dispense Refill   acetaminophen  (TYLENOL ) 325 MG tablet Take 2 tablets (650 mg total) by mouth every 6 (six) hours as needed. (Patient taking differently: Take 650 mg by mouth every 6 (six) hours as needed for mild pain (pain score 1-3).) 100 tablet 0   ALPRAZolam  (XANAX ) 0.5 MG tablet Take 0.5 mg by mouth at bedtime.     amLODipine  (NORVASC ) 10 MG tablet Take 1 tablet (10 mg total) by mouth daily. 30 tablet 11   citalopram  (CELEXA ) 20 MG tablet Take 1 tablet (20 mg total) by mouth daily. 90 tablet 2   gabapentin  (NEURONTIN ) 100 MG capsule Take 1 capsule (100 mg total) by mouth at bedtime. (Patient taking differently: Take 100 mg by mouth daily as needed (acid reflux).) 90 capsule 3   leptospermum manuka honey (MEDIHONEY) PSTE paste Apply 1 Application topically daily. 15 mL 0   linezolid  (ZYVOX ) 600 MG tablet Take 1 tablet (600 mg total) by mouth 2 (two) times daily for 14 days. 28 tablet 0   lisinopril  (ZESTRIL ) 20 MG tablet Take 1 tablet (20 mg total) by mouth daily. 30 tablet 3   oxyCODONE  (ROXICODONE ) 5 MG immediate release tablet Take 1 tablet (5 mg total) by mouth every 4 (four) hours as needed (for post op pain). 20 tablet 0   pantoprazole  (PROTONIX ) 40 MG tablet Take 1 tablet (40 mg total) by mouth 2 (two) times daily. TAKE 1 TABLET(40 MG) BY MOUTH TWICE DAILY 120 tablet 0   traZODone  (DESYREL ) 50 MG tablet Take 1 tablet (50 mg total) by mouth at bedtime. (Patient taking differently: Take 50 mg by mouth at bedtime as  needed for sleep.) 30 tablet 0   aminocaproic  acid (AMICAR ) 500 MG tablet Take 2 tablets (1,000 mg total) by mouth every 12 (twelve) hours. (Patient not taking: No sig reported) 120 tablet 3   cefadroxil  (DURICEF) 500 MG capsule Take 1 capsule (500 mg total) by mouth 2 (two) times daily. (Patient not taking: Reported on 04/22/2024) 20 capsule 0   lidocaine  4 % Place 1 patch onto the skin daily. 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   no meds, diet controlled, does not check blood sugar    Past Surgical History:  Procedure Laterality Date   ABDOMINAL HYSTERECTOMY     APPLICATION OF WOUND VAC Right 07/28/2023   Procedure: APPLICATION, WOUND VAC;  Surgeon: Lowery Estefana RAMAN, DO;  Location: [facility] OR;  Service: Plastics;  Laterality: Right;   APPLICATION OF WOUND VAC Right 03/16/2024   Procedure: APPLICATION, WOUND VAC;  Surgeon: Elsa Lonni SAUNDERS, MD;  Location: [facility] OR;  Service: Orthopedics;  Laterality: Right;   APPLICATION, SKIN SUBSTITUTE Right 07/28/2023   Procedure: APPLICATION, SKIN SUBSTITUTE;  Surgeon: Lowery Estefana RAMAN, DO;  Location: [facility] OR;  Service: Plastics;  Laterality: Right;   CARPAL TUNNEL RELEASE  05/13/2011   Procedure: CARPAL TUNNEL RELEASE;  Surgeon: Eva Elsie Herring, MD;  Location: [facility];  Service: Orthopedics;  Laterality: Left;   COLONOSCOPY  N/A 03/02/2020   Procedure: COLONOSCOPY;  Surgeon: Donnald Charleston, MD;  Location: [facility] ENDOSCOPY;  Service: Endoscopy;  Laterality: N/A;   COLONOSCOPY WITH PROPOFOL  N/A 04/28/2014   Procedure: COLONOSCOPY WITH PROPOFOL ;  Surgeon: Charleston Donnald GAILS, MD;  Location: [facility] ENDOSCOPY;  Service: Endoscopy;  Laterality: N/A;   DG TOES*L*  05/16/2008   rt   DILATION AND CURETTAGE OF UTERUS     ENTEROSCOPY N/A 10/17/2017   Procedure: ENTEROSCOPY;  Surgeon: Elicia Claw, MD;  Location: [facility];  Service: Gastroenterology;  Laterality: N/A;   ESOPHAGOGASTRODUODENOSCOPY N/A 04/10/2014   Procedure: ESOPHAGOGASTRODUODENOSCOPY (EGD);  Surgeon: Jerrell KYM Sol, MD;  Location: [facility] ENDOSCOPY;  Service: Endoscopy;  Laterality: N/A;   ESOPHAGOGASTRODUODENOSCOPY N/A 05/10/2017   Procedure: ESOPHAGOGASTRODUODENOSCOPY (EGD);  Surgeon: Donnald Charleston, MD;  Location: [facility] ENDOSCOPY;  Service: Endoscopy;  Laterality: N/A;   ESOPHAGOGASTRODUODENOSCOPY N/A 09/22/2017   Procedure: ESOPHAGOGASTRODUODENOSCOPY (EGD);  Surgeon: Rosalie Kitchens, MD;  Location: [facility] ENDOSCOPY;  Service: Endoscopy;  Laterality: N/A;  bedside   ESOPHAGOGASTRODUODENOSCOPY N/A 03/02/2020   Procedure: ESOPHAGOGASTRODUODENOSCOPY (EGD);  Surgeon: Donnald Charleston, MD;  Location: [facility];  Service: Endoscopy;  Laterality: N/A;   ESOPHAGOGASTRODUODENOSCOPY N/A 11/03/2020   Procedure: ESOPHAGOGASTRODUODENOSCOPY (EGD);  Surgeon: Sol Jerrell, MD;  Location: [facility] ENDOSCOPY;  Service: Endoscopy;  Laterality: N/A;   ESOPHAGOGASTRODUODENOSCOPY N/A 04/12/2022   Procedure: ESOPHAGOGASTRODUODENOSCOPY (EGD);  Surgeon: Rosalie Kitchens, MD;  Location: [facility];  Service: Gastroenterology;  Laterality: N/A;   ESOPHAGOGASTRODUODENOSCOPY N/A 05/27/2022   Procedure: ESOPHAGOGASTRODUODENOSCOPY (EGD);  Surgeon: Burnette Elsie, MD;  Location: [facility];  Service: Gastroenterology;  Laterality: N/A;   ESOPHAGOGASTRODUODENOSCOPY N/A 09/27/2022   Procedure:  ESOPHAGOGASTRODUODENOSCOPY (EGD);  Surgeon: Kriss Estefana DEL, DO;  Location: [facility] ENDOSCOPY;  Service: Gastroenterology;  Laterality: N/A;   ESOPHAGOGASTRODUODENOSCOPY N/A 03/06/2024   Procedure: EGD (ESOPHAGOGASTRODUODENOSCOPY);  Surgeon: San Sandor GAILS, DO;  Location: [facility] ENDOSCOPY;  Service: Gastroenterology;  Laterality: N/A;   ESOPHAGOGASTRODUODENOSCOPY (EGD) WITH PROPOFOL  N/A 04/27/2014   Procedure: ESOPHAGOGASTRODUODENOSCOPY (EGD) WITH PROPOFOL ;  Surgeon: Charleston Donnald GAILS, MD;  Location: [facility] ENDOSCOPY;  Service: Endoscopy;  Laterality: N/A;  possible apc   ESOPHAGOGASTRODUODENOSCOPY (EGD) WITH PROPOFOL  N/A 09/30/2017   Procedure: ESOPHAGOGASTRODUODENOSCOPY (EGD) WITH PROPOFOL ;  Surgeon: Saintclair Jasper, MD;  Location: [facility] ENDOSCOPY;  Service: Gastroenterology;  Laterality: N/A;   ESOPHAGOGASTRODUODENOSCOPY (EGD) WITH PROPOFOL  N/A 10/01/2017   Procedure: ESOPHAGOGASTRODUODENOSCOPY (EGD) WITH PROPOFOL ;  Surgeon: Saintclair Jasper, MD;  Location: [facility] ENDOSCOPY;  Service: Gastroenterology;  Laterality: N/A;   ESOPHAGOGASTRODUODENOSCOPY (EGD) WITH PROPOFOL  N/A 10/08/2017   Procedure: ESOPHAGOGASTRODUODENOSCOPY (EGD) WITH PROPOFOL ;  Surgeon: Elicia Claw, MD;  Location: [facility] ENDOSCOPY;  Service: Gastroenterology;  Laterality: N/A;   ESOPHAGOGASTRODUODENOSCOPY (EGD) WITH PROPOFOL  N/A 10/17/2017   Procedure: ESOPHAGOGASTRODUODENOSCOPY (EGD) WITH PROPOFOL ;  Surgeon: Elicia Claw, MD;  Location: [facility] ENDOSCOPY;  Service: Gastroenterology;  Laterality: N/A;   ESOPHAGOGASTRODUODENOSCOPY (EGD) WITH PROPOFOL  N/A 10/19/2017   Procedure: ESOPHAGOGASTRODUODENOSCOPY (EGD) WITH PROPOFOL ;  Surgeon: Elicia Claw, MD;  Location: [facility] ENDOSCOPY;  Service: Gastroenterology;  Laterality: N/A;   ESOPHAGOGASTRODUODENOSCOPY (EGD) WITH PROPOFOL  N/A 12/04/2018   Procedure: ESOPHAGOGASTRODUODENOSCOPY (EGD) WITH PROPOFOL ;  Surgeon: Dianna Specking, MD;  Location: [facility] ENDOSCOPY;  Service: Endoscopy;  Laterality: N/A;    GIVENS CAPSULE STUDY N/A 10/02/2017   Procedure: GIVENS CAPSULE STUDY;  Surgeon: Saintclair Jasper, MD;  Location: [facility] ENDOSCOPY;  Service: Gastroenterology;  Laterality: N/A;   GIVENS CAPSULE STUDY N/A 10/08/2017   Procedure: GIVENS CAPSULE STUDY;  Surgeon: Elicia Claw, MD;  Location: [facility] ENDOSCOPY;  Service: Gastroenterology;  Laterality: N/A;  endoscopic placement of capsule   GIVENS CAPSULE STUDY N/A 03/02/2020   Procedure: GIVENS CAPSULE STUDY;  Surgeon: Donnald Charleston, MD;  Location: [facility] ENDOSCOPY;  Service: Endoscopy;  Laterality: N/A;   HARDWARE REMOVAL Right 07/15/2023   Procedure: REMOVAL, HARDWARE;  Surgeon: Sherida Adine BROCKS, MD;  Location: [facility] OR;  Service: Orthopedics;  Laterality: Right;   HEMOSTASIS CLIP PLACEMENT  12/04/2018   Procedure: HEMOSTASIS CLIP PLACEMENT;  Surgeon: Dianna Specking, MD;  Location: [facility] ENDOSCOPY;  Service: Endoscopy;;   HEMOSTASIS CLIP PLACEMENT  05/27/2022   Procedure: HEMOSTASIS CLIP PLACEMENT;  Surgeon: Burnette Fallow, MD;  Location: [facility] ENDOSCOPY;  Service: Gastroenterology;;   HEMOSTASIS CLIP PLACEMENT  09/27/2022   Procedure: HEMOSTASIS CLIP PLACEMENT;  Surgeon: Kriss Estefana DEL, DO;  Location: [facility];  Service: Gastroenterology;;   HEMOSTASIS CONTROL  05/27/2022   Procedure: HEMOSTASIS CONTROL;  Surgeon: Burnette Fallow, MD;  Location: [facility] ENDOSCOPY;  Service: Gastroenterology;;   HOT HEMOSTASIS N/A 04/27/2014   Procedure: HOT HEMOSTASIS (ARGON PLASMA COAGULATION/BICAP);  Surgeon: Charleston Donnald GAILS, MD;  Location: [facility] ENDOSCOPY;  Service: Endoscopy;  Laterality: N/A;   HOT HEMOSTASIS N/A 09/30/2017   Procedure: HOT HEMOSTASIS (ARGON PLASMA COAGULATION/BICAP);  Surgeon: Saintclair Jasper, MD;  Location: [facility] ENDOSCOPY;  Service: Gastroenterology;  Laterality: N/A;   HOT HEMOSTASIS N/A 10/01/2017   Procedure: HOT HEMOSTASIS (ARGON PLASMA COAGULATION/BICAP);  Surgeon: Saintclair Jasper, MD;  Location: [facility] ENDOSCOPY;  Service: Gastroenterology;  Laterality: N/A;    HOT HEMOSTASIS N/A 10/17/2017   Procedure: HOT HEMOSTASIS (ARGON PLASMA COAGULATION/BICAP);  Surgeon: Elicia Claw, MD;  Location: [facility] ENDOSCOPY;  Service: Gastroenterology;  Laterality: N/A;   HOT HEMOSTASIS N/A 10/19/2017   Procedure: HOT HEMOSTASIS (ARGON PLASMA COAGULATION/BICAP);  Surgeon: Elicia Claw, MD;  Location: [facility] ENDOSCOPY;  Service: Gastroenterology;  Laterality: N/A;   HOT HEMOSTASIS N/A 03/02/2020   Procedure: HOT HEMOSTASIS (ARGON PLASMA COAGULATION/BICAP);  Surgeon: Donnald Charleston, MD;  Location: [facility];  Service: Endoscopy;  Laterality: N/A;   HOT HEMOSTASIS N/A 04/12/2022   Procedure: HOT HEMOSTASIS (ARGON PLASMA COAGULATION/BICAP);  Surgeon: Rosalie Kitchens, MD;  Location: [facility];  Service: Gastroenterology;  Laterality: N/A;   INCISION AND DRAINAGE OF DEEP ABSCESS, ANKLE Right 03/16/2024   Procedure: INCISION AND DRAINAGE OF DEEP ABSCESS, ANKLE;  Surgeon: Elsa Lonni SAUNDERS, MD;  Location: [facility] OR;  Service: Orthopedics;  Laterality: Right;   INCISION AND DRAINAGE OF WOUND Right  07/21/2023   Procedure: IRRIGATION AND DEBRIDEMENT WOUND;  Surgeon: Sherida Adine BROCKS, MD;  Location: [facility] OR;  Service: Orthopedics;  Laterality: Right;  RIGHT ANKLE WOUND I&D   INCISION AND DRAINAGE OF WOUND Right 07/28/2023   Procedure: IRRIGATION AND DEBRIDEMENT WOUND;  Surgeon: Lowery Estefana RAMAN, DO;  Location: [facility] OR;  Service: Plastics;  Laterality: Right;  rrigation and debridement of right ankle wound with placement of myriad and wound vac   IR IMAGING GUIDED PORT INSERTION  07/08/2018   IRRIGATION AND DEBRIDEMENT POSTERIOR HIP Right 07/15/2023   Procedure: IRRIGATION AND DEBRIDEMENT, OPEN FRACTURE;  Surgeon: Sherida Adine BROCKS, MD;  Location: [facility] OR;  Service: Orthopedics;  Laterality: Right;   L shoulder Surgery  04/15/2009   ORIF ANKLE FRACTURE Right 04/24/2023   Procedure: OPEN REDUCTION INTERNAL FIXATION (ORIF) ANKLE FRACTURE;  Surgeon: Sherida Adine BROCKS, MD;  Location: [facility] OR;   Service: Orthopedics;  Laterality: Right;   POLYPECTOMY  03/02/2020   Procedure: POLYPECTOMY;  Surgeon: Donnald Charleston, MD;  Location: [facility] ENDOSCOPY;  Service: Endoscopy;;   SCLEROTHERAPY  11/03/2020   Procedure: MATIAS;  Surgeon: Dianna Specking, MD;  Location: [facility] ENDOSCOPY;  Service: Endoscopy;;   SUBMUCOSAL INJECTION  09/22/2017   Procedure: SUBMUCOSAL INJECTION;  Surgeon: Rosalie Kitchens, MD;  Location: [facility] ENDOSCOPY;  Service: Endoscopy;;   SUBMUCOSAL INJECTION  12/04/2018   Procedure: SUBMUCOSAL INJECTION;  Surgeon: Dianna Specking, MD;  Location: [facility] ENDOSCOPY;  Service: Endoscopy;;    Family History  Problem Relation Age of Onset   Cancer Mother        Ovarian   Ovarian cancer Mother    Diabetes Mother    Kidney disease Mother    Bleeding Disorder Mother    Diabetes type II Sister    Bleeding Disorder Sister    Diabetes Sister    Colon cancer Maternal Grandfather    Crohn's disease Maternal Grandfather    Stomach cancer Maternal Grandmother    Kidney disease Son    Bleeding Disorder Son    Dysmenorrhea Neg Hx     Social History   Socioeconomic History   Marital status: Widowed    Spouse name: Not on file   Number of children: 3   Years of education: 71   Highest education level: Not on file  Occupational History   Not on file  Tobacco Use   Smoking status: Every Day    Current packs/day: 0.50    Average packs/day: 0.5 packs/day for 30.0 years (15.0 ttl pk-yrs)    Types: Cigarettes   Smokeless tobacco: Former    Types: Snuff    Quit date: 8   Tobacco comments:    1/2 PPD per patient on 03/10/24  Vaping Use   Vaping status: Never Used  Substance and Sexual Activity   Alcohol use: No    Alcohol/week: 0.0 standard drinks of alcohol   Drug use: Not Currently    Comment: last cocaine-2010   Sexual activity: Not Currently    Comment: Hysterectomy  Other Topics Concern   Not on file  Social History Narrative   Not on file   Social Drivers of  Health   Tobacco Use: High Risk (04/22/2024)   Patient History    Smoking Tobacco Use: Every Day    Smokeless Tobacco Use: Former    Passive Exposure: Not on Actuary Strain: Not on file  Food Insecurity: No Food Insecurity (03/06/2024)   Epic    Worried About Programme Researcher, Broadcasting/film/video in the  Last Year: Never true    Ran Out of Food in the Last Year: Never true  Transportation Needs: No Transportation Needs (03/06/2024)   Epic    Lack of Transportation (Medical): No    Lack of Transportation (Non-Medical): No  Physical Activity: Not on file  Stress: Not on file  Social Connections: Not on file  Intimate Partner Violence: Not At Risk (03/06/2024)   Epic    Fear of Current or Ex-Partner: No    Emotionally Abused: No    Physically Abused: No    Sexually Abused: No  Depression (PHQ2-9): Low Risk (03/20/2024)   Depression (PHQ2-9)    PHQ-2 Score: 0  Alcohol Screen: Not on file  Housing: Low Risk (03/06/2024)   Epic    Unable to Pay for Housing in the Last Year: No    Number of Times Moved in the Last Year: 0    Homeless in the Last Year: No  Utilities: Not At Risk (03/06/2024)   Epic    Threatened with loss of utilities: No  Health Literacy: Not on file    Code Status   Code Status: Full Code   Vina Dasen, NP-C   04/22/2024, 3:53 PM  ---------------------------------------------------------------------------  I have taken a history, reviewed the chart and examined the patient. I performed a substantive portion of this encounter, including complete performance of at least one of the key components, in conjunction with the APP. 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 She is hemodynamically stable and has had intermittent black stools off and on which is normal for her, but no significant increase in melanic stool recently.  She was apparently followed by [facility] gastro[facility]ology, but apparently was dismissed from their practice. She is followed by Dr. Lanny in oncology and has received multiple IV iron  infusions through their office, as well as bevacizumab .  She has had multiple upper endoscopies in the past few years in which AVMs and other lesions were treated. She had a capsule endoscopy in 2021 which did show a few intestinal AVMs.  I would like to reassess her small bowel burden with a another pill camera.  If she has numerous AVMs, it may be worth trying to get octreotide  or thalidomide approved. Will repeat upper endoscopy and we will plan to treat any high risk for bleeding AVMs found in the upper GI tract.  We will plan to endoscopically place the pill camera at the time of her upper endoscopy.  Clear liquid diet this evening, n.p.o. after midnight  Rhylen Pulido E. Stacia, MD [facility]  High complex medical decision making (this includes chart review, review of results, face-to-face time used for counseling as well as treatment plan and follow-up. The patient was provided an opportunity to ask questions and all were answered. The patient agreed with the plan and demonstrated an understanding of the instructions    "

## 2024-04-22 NOTE — H&P (View-Only) (Signed)
 "                                               Consultation Note   Referring Provider:   [facility] PCP: Nooruddin, Saad, MD Primary Gastroenterologist:   Sampson.  Dismissed from [facility]       Reason for Consultation: Dark stool, recurrent anemia DOA: 04/22/2024         Hospital Day: 1   Assessment and Plan:  64 year old female with a history of recurrent gastrointestinal bleeding , PUD , AVM disease of the stomach, small bowel and colon  treated on multiple occasions with APC and as recently as late November 2025. Suspect GI bleed secondary to AVMs 3 units of PBC's have already been ordered Patient will need repeat EGD for possible retreatment of gastric AVMs.  Also, it has been a few years since small bowel burden of AVMs has been evaluated.  Will plan for small bowel video capsule study to be dropped at the time of endoscopy tomorrow. Schedule for EGD. The risks and benefits of EGD with possible biopsies were discussed with the patient who agrees to proceed.  Clear liquid diet, n.p.o. after midnight  GERD Takes pantoprazole  at home Will continue home daily pantoprazole   Hereditary hemorrhagic telangiectasia ( HHT)  Hypertension  Generalized anxiety disorder  Osteomyelitis right fibula   / tibia .   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  She has history of gastric ulcers and AVM disease of the stomach, small bowel and colon and has undergone multiple endoscopic evaluations with treatment of these AVMs.  Most recently, patient was hospitalized in late November 2025 with dark stools and hemoglobin in the 4 range.  EGD 03/09/2024 remarkable for 11 nonbleeding angioectasias in the stomach treated with APC.  Also nonbleeding gastric ulcers with pigmented material.  There were 3 nonbleeding angioectasias in the duodenum, also treated with APC.  Patient was transfused, labs in December show hemoglobin of 7.7.  Patient receives IV iron  infusions through hematology.    Interval History:  Patient has a infected right ankle .  She is followed outpatient by infectious disease .  Yesterday labs done at infectious disease office remarkable for another significant drop in her hemoglobin and patient sent to ED. Patient has passed dark stools about 3 times this week .  She did not think a lot of it.  She has not particularly been short of breath.  No associated nausea, vomiting or abdominal pain. No NSAID use.  She is not anticoagulated  Admission workup notable for hemoglobin of 4.1, MCV 86, BUN 9, ferritin 23   Recent Labs    04/21/24 1532 04/22/24 1326  PROT 6.1 6.2*  ALBUMIN  --  3.8  AST 9* 11*  ALT 6 5  ALKPHOS  --  92  BILITOT 0.2 <0.2   Recent Labs    04/21/24 1532 04/22/24 1326  WBC 11.8* 10.6*  HGB 4.1* 4.5*  HCT 15.0* 16.1*  MCV 86.7 89.0  PLT 354 371   Recent Labs    04/21/24 1532 04/22/24 1326  NA 141 143  K 3.9 3.8  CL 110 110  CO2 23 24  GLUCOSE 89 147*  BUN 9 9  CREATININE 0.86 0.81  CALCIUM  8.4* 8.7*     DG Chest 2 View CLINICAL DATA:  Shortness of breath  EXAM: CHEST - 2 VIEW  COMPARISON:  March 04, 2024  FINDINGS: Stable cardiomegaly. Right internal jugular port is unchanged. Both lungs are clear. The visualized skeletal structures  are unremarkable.  IMPRESSION: No active cardiopulmonary disease.  Electronically Signed   By: Lynwood Landy Raddle M.D.   On: 04/22/2024 14:08   Review of Systems: Positive for recent episodes of epistaxis .  All systems reviewed and negative except where noted in HPI.  Physical Exam: Vital signs in last 24 hours: Temp:  [98.7 F (37.1 C)-98.8 F (37.1 C)] 98.7 F (37.1 C) (01/08 1531) Pulse Rate:  [72-81] 72 (01/08 1531) Resp:  [18-20] 18 (01/08 1531) BP: (130-161)/(61-66) 161/66 (01/08 1531) SpO2:  [100 %] 100 % (01/08 1531) Weight:  [101.2 kg] 101.2 kg (01/08 1255)   General:  Pleasant female in NAD Psych:  Cooperative. Normal mood and affect Eyes: Pupils equal Ears:  Normal auditory acuity Nose: No deformity, discharge or lesions Neck:  Supple, no masses felt Lungs:  Clear to auscultation.  Heart:  Regular rate, regular rhythm.  Loud heart murmur is present Abdomen:  Soft, nondistended, nontender, active bowel sounds, no masses felt Rectal :  Deferred Msk: Symmetrical without gross deformities.  Neurologic:  Alert, oriented, grossly normal neurologically Extremities : No edema Skin:  Intact without significant lesions.   OUTPATIENT MEDICATIONS Prior to Admission medications  Medication Sig Start Date End Date Taking? Authorizing Provider  acetaminophen  (TYLENOL ) 325 MG tablet Take 2 tablets (650 mg total) by mouth every 6 (six) hours as needed. Patient taking differently: Take 650 mg by mouth every 6 (six) hours as needed for mild pain (pain score 1-3). 05/19/23  Yes Raulkar, Sven SQUIBB, MD  ALPRAZolam  (XANAX ) 0.5 MG tablet Take 0.5 mg by mouth at bedtime.   Yes [provider]  amLODipine  (NORVASC ) 10 MG tablet Take 1 tablet (10 mg total) by mouth daily. 06/10/23 06/09/24 Yes Gregary Sharper, MD  citalopram  (CELEXA ) 20 MG tablet Take 1 tablet (20 mg total) by mouth daily. 08/27/23  Yes Nooruddin, Saad, MD  gabapentin  (NEURONTIN ) 100 MG capsule Take 1 capsule (100  mg total) by mouth at bedtime. Patient taking differently: Take 100 mg by mouth daily as needed (acid reflux). 07/04/23  Yes Raulkar, Sven SQUIBB, MD  leptospermum manuka honey (MEDIHONEY) PSTE paste Apply 1 Application topically daily. 12/28/23  Yes Tobie Gaines, DO  linezolid  (ZYVOX ) 600 MG tablet Take 1 tablet (600 mg total) by mouth 2 (two) times daily for 14 days. 04/21/24 05/05/24 Yes Dennise Kingsley, MD  lisinopril  (ZESTRIL ) 20 MG tablet Take 1 tablet (20 mg total) by mouth daily. 06/23/23  Yes Nooruddin, Saad, MD  oxyCODONE  (ROXICODONE ) 5 MG immediate release tablet Take 1 tablet (5 mg total) by mouth every 4 (four) hours as needed (for post op pain). 03/16/24  Yes Jordan, Jesse J, PA-C  pantoprazole  (PROTONIX ) 40 MG tablet  Take 1 tablet (40 mg total) by mouth 2 (two) times daily. TAKE 1 TABLET(40 MG) BY MOUTH TWICE DAILY 03/07/24 05/06/24 Yes Leotis Bogus, MD  traZODone  (DESYREL ) 50 MG tablet Take 1 tablet (50 mg total) by mouth at bedtime. Patient taking differently: Take 50 mg by mouth at bedtime as needed for sleep. 03/18/24  Yes Nooruddin, Saad, MD  aminocaproic  acid (AMICAR ) 500 MG tablet Take 2 tablets (1,000 mg total) by mouth every 12 (twelve) hours. Patient not taking: No sig reported 02/18/24   Lanny Callander, MD  cefadroxil  (DURICEF) 500 MG capsule Take 1 capsule (500 mg total) by mouth 2 (two) times daily. Patient not taking: Reported on 04/22/2024 03/16/24   Jordan, Jesse J, PA-C  lidocaine  4 % Place 1 patch onto the skin daily. Remove & Discard patch within 12 hours or as directed by MD Patient not taking: No sig reported 12/27/23   Tobie Gaines, DO    Allergies as of 04/22/2024 - Review Complete 04/22/2024  Allergen Reaction Noted   Feraheme  [ferumoxytol ] Other (See Comments) 10/09/2017   Nsaids Other (See Comments) 04/25/2014   Wasp venom Anaphylaxis 10/06/2017   Tomato Hives 01/01/2012   Iron  (ferrous sulfate ) [ferrous sulfate  er] Other (See Comments) 03/07/2020    INPATIENT  MEDICATIONS Current Facility-Administered Medications  Medication Dose Route Frequency Provider Last Rate Last Admin   0.9 %  sodium chloride  infusion (Manually program via Guardrails IV Fluids)   Intravenous Once Barrett, Jamie N, PA-C       0.9 %  sodium chloride  infusion   Intravenous Continuous Shahmehdi, Seyed A, MD       acetaminophen  (TYLENOL ) tablet 650 mg  650 mg Oral Q6H PRN Shahmehdi, Seyed A, MD       Or   acetaminophen  (TYLENOL ) suppository 650 mg  650 mg Rectal Q6H PRN Shahmehdi, Seyed A, MD       ALPRAZolam  (XANAX ) tablet 0.5 mg  0.5 mg Oral QHS Shahmehdi, Seyed A, MD       amLODipine  (NORVASC ) tablet 10 mg  10 mg Oral Daily Shahmehdi, Seyed A, MD       bisacodyl  (DULCOLAX) EC tablet 5 mg  5 mg Oral Daily PRN Shahmehdi, Seyed A, MD       citalopram  (CELEXA ) tablet 20 mg  20 mg Oral Daily Shahmehdi, Seyed A, MD       diphenhydrAMINE  (BENADRYL ) capsule 50 mg  50 mg Oral Once Barrett, Jamie N, PA-C       hydrALAZINE  (APRESOLINE ) injection 10 mg  10 mg Intravenous Q4H PRN Shahmehdi, Seyed A, MD       HYDROmorphone  (DILAUDID ) injection 0.5-1 mg  0.5-1 mg Intravenous Q2H PRN Shahmehdi, Seyed A, MD       ipratropium (ATROVENT ) nebulizer solution 0.5 mg  0.5 mg Nebulization Q6H PRN Shahmehdi, Seyed A, MD       leptospermum manuka honey (MEDIHONEY) paste 1 Application  1 Application Topical Daily Shahmehdi, Seyed A, MD       linezolid  (ZYVOX ) tablet 600 mg  600 mg Oral BID Shahmehdi, Seyed A, MD       lisinopril  (ZESTRIL ) tablet 20 mg  20 mg Oral Daily Shahmehdi, Seyed A, MD       ondansetron  (ZOFRAN ) tablet 4 mg  4 mg Oral Q6H PRN Shahmehdi, Seyed A, MD       Or   ondansetron  (ZOFRAN ) injection 4 mg  4 mg Intravenous Q6H PRN Shahmehdi, Seyed A, MD       oxyCODONE  (Oxy IR/ROXICODONE )  immediate release tablet 5 mg  5 mg Oral Q4H PRN Shahmehdi, Seyed A, MD       pantoprazole  (PROTONIX ) injection 40 mg  40 mg Intravenous Once Barrett, Jamie N, PA-C       senna-docusate (Senokot-S) tablet 1  tablet  1 tablet Oral QHS PRN Shahmehdi, Seyed A, MD       sodium chloride  flush (NS) 0.9 % injection 3 mL  3 mL Intravenous Q12H Shahmehdi, Seyed A, MD       sodium chloride  flush (NS) 0.9 % injection 3 mL  3 mL Intravenous Q12H Shahmehdi, Seyed A, MD       sodium phosphate  (FLEET) enema 1 enema  1 enema Rectal Once PRN Shahmehdi, Seyed A, MD       traZODone  (DESYREL ) tablet 25 mg  25 mg Oral QHS PRN Shahmehdi, Adriana LABOR, MD       Current Outpatient Medications  Medication Sig Dispense Refill   acetaminophen  (TYLENOL ) 325 MG tablet Take 2 tablets (650 mg total) by mouth every 6 (six) hours as needed. (Patient taking differently: Take 650 mg by mouth every 6 (six) hours as needed for mild pain (pain score 1-3).) 100 tablet 0   ALPRAZolam  (XANAX ) 0.5 MG tablet Take 0.5 mg by mouth at bedtime.     amLODipine  (NORVASC ) 10 MG tablet Take 1 tablet (10 mg total) by mouth daily. 30 tablet 11   citalopram  (CELEXA ) 20 MG tablet Take 1 tablet (20 mg total) by mouth daily. 90 tablet 2   gabapentin  (NEURONTIN ) 100 MG capsule Take 1 capsule (100 mg total) by mouth at bedtime. (Patient taking differently: Take 100 mg by mouth daily as needed (acid reflux).) 90 capsule 3   leptospermum manuka honey (MEDIHONEY) PSTE paste Apply 1 Application topically daily. 15 mL 0   linezolid  (ZYVOX ) 600 MG tablet Take 1 tablet (600 mg total) by mouth 2 (two) times daily for 14 days. 28 tablet 0   lisinopril  (ZESTRIL ) 20 MG tablet Take 1 tablet (20 mg total) by mouth daily. 30 tablet 3   oxyCODONE  (ROXICODONE ) 5 MG immediate release tablet Take 1 tablet (5 mg total) by mouth every 4 (four) hours as needed (for post op pain). 20 tablet 0   pantoprazole  (PROTONIX ) 40 MG tablet Take 1 tablet (40 mg total) by mouth 2 (two) times daily. TAKE 1 TABLET(40 MG) BY MOUTH TWICE DAILY 120 tablet 0   traZODone  (DESYREL ) 50 MG tablet Take 1 tablet (50 mg total) by mouth at bedtime. (Patient taking differently: Take 50 mg by mouth at bedtime as  needed for sleep.) 30 tablet 0   aminocaproic  acid (AMICAR ) 500 MG tablet Take 2 tablets (1,000 mg total) by mouth every 12 (twelve) hours. (Patient not taking: No sig reported) 120 tablet 3   cefadroxil  (DURICEF) 500 MG capsule Take 1 capsule (500 mg total) by mouth 2 (two) times daily. (Patient not taking: Reported on 04/22/2024) 20 capsule 0   lidocaine  4 % Place 1 patch onto the skin daily. 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   no meds, diet controlled, does not check blood sugar    Past Surgical History:  Procedure Laterality Date   ABDOMINAL HYSTERECTOMY     APPLICATION OF WOUND VAC Right 07/28/2023   Procedure: APPLICATION, WOUND VAC;  Surgeon: Lowery Estefana RAMAN, DO;  Location: [facility] OR;  Service: Plastics;  Laterality: Right;   APPLICATION OF WOUND VAC Right 03/16/2024   Procedure: APPLICATION, WOUND VAC;  Surgeon: Elsa Lonni SAUNDERS, MD;  Location: [facility] OR;  Service: Orthopedics;  Laterality: Right;   APPLICATION, SKIN SUBSTITUTE Right 07/28/2023   Procedure: APPLICATION, SKIN SUBSTITUTE;  Surgeon: Lowery Estefana RAMAN, DO;  Location: [facility] OR;  Service: Plastics;  Laterality: Right;   CARPAL TUNNEL RELEASE  05/13/2011   Procedure: CARPAL TUNNEL RELEASE;  Surgeon: Eva Elsie Herring, MD;  Location: [facility];  Service: Orthopedics;  Laterality: Left;   COLONOSCOPY  N/A 03/02/2020   Procedure: COLONOSCOPY;  Surgeon: Donnald Charleston, MD;  Location: [facility] ENDOSCOPY;  Service: Endoscopy;  Laterality: N/A;   COLONOSCOPY WITH PROPOFOL  N/A 04/28/2014   Procedure: COLONOSCOPY WITH PROPOFOL ;  Surgeon: Charleston Donnald GAILS, MD;  Location: [facility] ENDOSCOPY;  Service: Endoscopy;  Laterality: N/A;   DG TOES*L*  05/16/2008   rt   DILATION AND CURETTAGE OF UTERUS     ENTEROSCOPY N/A 10/17/2017   Procedure: ENTEROSCOPY;  Surgeon: Elicia Claw, MD;  Location: [facility];  Service: Gastroenterology;  Laterality: N/A;   ESOPHAGOGASTRODUODENOSCOPY N/A 04/10/2014   Procedure: ESOPHAGOGASTRODUODENOSCOPY (EGD);  Surgeon: Jerrell KYM Sol, MD;  Location: [facility] ENDOSCOPY;  Service: Endoscopy;  Laterality: N/A;   ESOPHAGOGASTRODUODENOSCOPY N/A 05/10/2017   Procedure: ESOPHAGOGASTRODUODENOSCOPY (EGD);  Surgeon: Donnald Charleston, MD;  Location: [facility] ENDOSCOPY;  Service: Endoscopy;  Laterality: N/A;   ESOPHAGOGASTRODUODENOSCOPY N/A 09/22/2017   Procedure: ESOPHAGOGASTRODUODENOSCOPY (EGD);  Surgeon: Rosalie Kitchens, MD;  Location: [facility] ENDOSCOPY;  Service: Endoscopy;  Laterality: N/A;  bedside   ESOPHAGOGASTRODUODENOSCOPY N/A 03/02/2020   Procedure: ESOPHAGOGASTRODUODENOSCOPY (EGD);  Surgeon: Donnald Charleston, MD;  Location: [facility];  Service: Endoscopy;  Laterality: N/A;   ESOPHAGOGASTRODUODENOSCOPY N/A 11/03/2020   Procedure: ESOPHAGOGASTRODUODENOSCOPY (EGD);  Surgeon: Sol Jerrell, MD;  Location: [facility] ENDOSCOPY;  Service: Endoscopy;  Laterality: N/A;   ESOPHAGOGASTRODUODENOSCOPY N/A 04/12/2022   Procedure: ESOPHAGOGASTRODUODENOSCOPY (EGD);  Surgeon: Rosalie Kitchens, MD;  Location: [facility];  Service: Gastroenterology;  Laterality: N/A;   ESOPHAGOGASTRODUODENOSCOPY N/A 05/27/2022   Procedure: ESOPHAGOGASTRODUODENOSCOPY (EGD);  Surgeon: Burnette Elsie, MD;  Location: [facility];  Service: Gastroenterology;  Laterality: N/A;   ESOPHAGOGASTRODUODENOSCOPY N/A 09/27/2022   Procedure:  ESOPHAGOGASTRODUODENOSCOPY (EGD);  Surgeon: Kriss Estefana DEL, DO;  Location: [facility] ENDOSCOPY;  Service: Gastroenterology;  Laterality: N/A;   ESOPHAGOGASTRODUODENOSCOPY N/A 03/06/2024   Procedure: EGD (ESOPHAGOGASTRODUODENOSCOPY);  Surgeon: San Sandor GAILS, DO;  Location: [facility] ENDOSCOPY;  Service: Gastroenterology;  Laterality: N/A;   ESOPHAGOGASTRODUODENOSCOPY (EGD) WITH PROPOFOL  N/A 04/27/2014   Procedure: ESOPHAGOGASTRODUODENOSCOPY (EGD) WITH PROPOFOL ;  Surgeon: Charleston Donnald GAILS, MD;  Location: [facility] ENDOSCOPY;  Service: Endoscopy;  Laterality: N/A;  possible apc   ESOPHAGOGASTRODUODENOSCOPY (EGD) WITH PROPOFOL  N/A 09/30/2017   Procedure: ESOPHAGOGASTRODUODENOSCOPY (EGD) WITH PROPOFOL ;  Surgeon: Saintclair Jasper, MD;  Location: [facility] ENDOSCOPY;  Service: Gastroenterology;  Laterality: N/A;   ESOPHAGOGASTRODUODENOSCOPY (EGD) WITH PROPOFOL  N/A 10/01/2017   Procedure: ESOPHAGOGASTRODUODENOSCOPY (EGD) WITH PROPOFOL ;  Surgeon: Saintclair Jasper, MD;  Location: [facility] ENDOSCOPY;  Service: Gastroenterology;  Laterality: N/A;   ESOPHAGOGASTRODUODENOSCOPY (EGD) WITH PROPOFOL  N/A 10/08/2017   Procedure: ESOPHAGOGASTRODUODENOSCOPY (EGD) WITH PROPOFOL ;  Surgeon: Elicia Claw, MD;  Location: [facility] ENDOSCOPY;  Service: Gastroenterology;  Laterality: N/A;   ESOPHAGOGASTRODUODENOSCOPY (EGD) WITH PROPOFOL  N/A 10/17/2017   Procedure: ESOPHAGOGASTRODUODENOSCOPY (EGD) WITH PROPOFOL ;  Surgeon: Elicia Claw, MD;  Location: [facility] ENDOSCOPY;  Service: Gastroenterology;  Laterality: N/A;   ESOPHAGOGASTRODUODENOSCOPY (EGD) WITH PROPOFOL  N/A 10/19/2017   Procedure: ESOPHAGOGASTRODUODENOSCOPY (EGD) WITH PROPOFOL ;  Surgeon: Elicia Claw, MD;  Location: [facility] ENDOSCOPY;  Service: Gastroenterology;  Laterality: N/A;   ESOPHAGOGASTRODUODENOSCOPY (EGD) WITH PROPOFOL  N/A 12/04/2018   Procedure: ESOPHAGOGASTRODUODENOSCOPY (EGD) WITH PROPOFOL ;  Surgeon: Dianna Specking, MD;  Location: [facility] ENDOSCOPY;  Service: Endoscopy;  Laterality: N/A;    GIVENS CAPSULE STUDY N/A 10/02/2017   Procedure: GIVENS CAPSULE STUDY;  Surgeon: Saintclair Jasper, MD;  Location: [facility] ENDOSCOPY;  Service: Gastroenterology;  Laterality: N/A;   GIVENS CAPSULE STUDY N/A 10/08/2017   Procedure: GIVENS CAPSULE STUDY;  Surgeon: Elicia Claw, MD;  Location: [facility] ENDOSCOPY;  Service: Gastroenterology;  Laterality: N/A;  endoscopic placement of capsule   GIVENS CAPSULE STUDY N/A 03/02/2020   Procedure: GIVENS CAPSULE STUDY;  Surgeon: Donnald Charleston, MD;  Location: [facility] ENDOSCOPY;  Service: Endoscopy;  Laterality: N/A;   HARDWARE REMOVAL Right 07/15/2023   Procedure: REMOVAL, HARDWARE;  Surgeon: Sherida Adine BROCKS, MD;  Location: [facility] OR;  Service: Orthopedics;  Laterality: Right;   HEMOSTASIS CLIP PLACEMENT  12/04/2018   Procedure: HEMOSTASIS CLIP PLACEMENT;  Surgeon: Dianna Specking, MD;  Location: [facility] ENDOSCOPY;  Service: Endoscopy;;   HEMOSTASIS CLIP PLACEMENT  05/27/2022   Procedure: HEMOSTASIS CLIP PLACEMENT;  Surgeon: Burnette Fallow, MD;  Location: [facility] ENDOSCOPY;  Service: Gastroenterology;;   HEMOSTASIS CLIP PLACEMENT  09/27/2022   Procedure: HEMOSTASIS CLIP PLACEMENT;  Surgeon: Kriss Estefana DEL, DO;  Location: [facility];  Service: Gastroenterology;;   HEMOSTASIS CONTROL  05/27/2022   Procedure: HEMOSTASIS CONTROL;  Surgeon: Burnette Fallow, MD;  Location: [facility] ENDOSCOPY;  Service: Gastroenterology;;   HOT HEMOSTASIS N/A 04/27/2014   Procedure: HOT HEMOSTASIS (ARGON PLASMA COAGULATION/BICAP);  Surgeon: Charleston Donnald GAILS, MD;  Location: [facility] ENDOSCOPY;  Service: Endoscopy;  Laterality: N/A;   HOT HEMOSTASIS N/A 09/30/2017   Procedure: HOT HEMOSTASIS (ARGON PLASMA COAGULATION/BICAP);  Surgeon: Saintclair Jasper, MD;  Location: [facility] ENDOSCOPY;  Service: Gastroenterology;  Laterality: N/A;   HOT HEMOSTASIS N/A 10/01/2017   Procedure: HOT HEMOSTASIS (ARGON PLASMA COAGULATION/BICAP);  Surgeon: Saintclair Jasper, MD;  Location: [facility] ENDOSCOPY;  Service: Gastroenterology;  Laterality: N/A;    HOT HEMOSTASIS N/A 10/17/2017   Procedure: HOT HEMOSTASIS (ARGON PLASMA COAGULATION/BICAP);  Surgeon: Elicia Claw, MD;  Location: [facility] ENDOSCOPY;  Service: Gastroenterology;  Laterality: N/A;   HOT HEMOSTASIS N/A 10/19/2017   Procedure: HOT HEMOSTASIS (ARGON PLASMA COAGULATION/BICAP);  Surgeon: Elicia Claw, MD;  Location: [facility] ENDOSCOPY;  Service: Gastroenterology;  Laterality: N/A;   HOT HEMOSTASIS N/A 03/02/2020   Procedure: HOT HEMOSTASIS (ARGON PLASMA COAGULATION/BICAP);  Surgeon: Donnald Charleston, MD;  Location: [facility];  Service: Endoscopy;  Laterality: N/A;   HOT HEMOSTASIS N/A 04/12/2022   Procedure: HOT HEMOSTASIS (ARGON PLASMA COAGULATION/BICAP);  Surgeon: Rosalie Kitchens, MD;  Location: [facility];  Service: Gastroenterology;  Laterality: N/A;   INCISION AND DRAINAGE OF DEEP ABSCESS, ANKLE Right 03/16/2024   Procedure: INCISION AND DRAINAGE OF DEEP ABSCESS, ANKLE;  Surgeon: Elsa Lonni SAUNDERS, MD;  Location: [facility] OR;  Service: Orthopedics;  Laterality: Right;   INCISION AND DRAINAGE OF WOUND Right  07/21/2023   Procedure: IRRIGATION AND DEBRIDEMENT WOUND;  Surgeon: Sherida Adine BROCKS, MD;  Location: [facility] OR;  Service: Orthopedics;  Laterality: Right;  RIGHT ANKLE WOUND I&D   INCISION AND DRAINAGE OF WOUND Right 07/28/2023   Procedure: IRRIGATION AND DEBRIDEMENT WOUND;  Surgeon: Lowery Estefana RAMAN, DO;  Location: [facility] OR;  Service: Plastics;  Laterality: Right;  rrigation and debridement of right ankle wound with placement of myriad and wound vac   IR IMAGING GUIDED PORT INSERTION  07/08/2018   IRRIGATION AND DEBRIDEMENT POSTERIOR HIP Right 07/15/2023   Procedure: IRRIGATION AND DEBRIDEMENT, OPEN FRACTURE;  Surgeon: Sherida Adine BROCKS, MD;  Location: [facility] OR;  Service: Orthopedics;  Laterality: Right;   L shoulder Surgery  04/15/2009   ORIF ANKLE FRACTURE Right 04/24/2023   Procedure: OPEN REDUCTION INTERNAL FIXATION (ORIF) ANKLE FRACTURE;  Surgeon: Sherida Adine BROCKS, MD;  Location: [facility] OR;   Service: Orthopedics;  Laterality: Right;   POLYPECTOMY  03/02/2020   Procedure: POLYPECTOMY;  Surgeon: Donnald Charleston, MD;  Location: [facility] ENDOSCOPY;  Service: Endoscopy;;   SCLEROTHERAPY  11/03/2020   Procedure: MATIAS;  Surgeon: Dianna Specking, MD;  Location: [facility] ENDOSCOPY;  Service: Endoscopy;;   SUBMUCOSAL INJECTION  09/22/2017   Procedure: SUBMUCOSAL INJECTION;  Surgeon: Rosalie Kitchens, MD;  Location: [facility] ENDOSCOPY;  Service: Endoscopy;;   SUBMUCOSAL INJECTION  12/04/2018   Procedure: SUBMUCOSAL INJECTION;  Surgeon: Dianna Specking, MD;  Location: [facility] ENDOSCOPY;  Service: Endoscopy;;    Family History  Problem Relation Age of Onset   Cancer Mother        Ovarian   Ovarian cancer Mother    Diabetes Mother    Kidney disease Mother    Bleeding Disorder Mother    Diabetes type II Sister    Bleeding Disorder Sister    Diabetes Sister    Colon cancer Maternal Grandfather    Crohn's disease Maternal Grandfather    Stomach cancer Maternal Grandmother    Kidney disease Son    Bleeding Disorder Son    Dysmenorrhea Neg Hx     Social History   Socioeconomic History   Marital status: Widowed    Spouse name: Not on file   Number of children: 3   Years of education: 71   Highest education level: Not on file  Occupational History   Not on file  Tobacco Use   Smoking status: Every Day    Current packs/day: 0.50    Average packs/day: 0.5 packs/day for 30.0 years (15.0 ttl pk-yrs)    Types: Cigarettes   Smokeless tobacco: Former    Types: Snuff    Quit date: 8   Tobacco comments:    1/2 PPD per patient on 03/10/24  Vaping Use   Vaping status: Never Used  Substance and Sexual Activity   Alcohol use: No    Alcohol/week: 0.0 standard drinks of alcohol   Drug use: Not Currently    Comment: last cocaine-2010   Sexual activity: Not Currently    Comment: Hysterectomy  Other Topics Concern   Not on file  Social History Narrative   Not on file   Social Drivers of  Health   Tobacco Use: High Risk (04/22/2024)   Patient History    Smoking Tobacco Use: Every Day    Smokeless Tobacco Use: Former    Passive Exposure: Not on Actuary Strain: Not on file  Food Insecurity: No Food Insecurity (03/06/2024)   Epic    Worried About Programme Researcher, Broadcasting/film/video in the  Last Year: Never true    Ran Out of Food in the Last Year: Never true  Transportation Needs: No Transportation Needs (03/06/2024)   Epic    Lack of Transportation (Medical): No    Lack of Transportation (Non-Medical): No  Physical Activity: Not on file  Stress: Not on file  Social Connections: Not on file  Intimate Partner Violence: Not At Risk (03/06/2024)   Epic    Fear of Current or Ex-Partner: No    Emotionally Abused: No    Physically Abused: No    Sexually Abused: No  Depression (PHQ2-9): Low Risk (03/20/2024)   Depression (PHQ2-9)    PHQ-2 Score: 0  Alcohol Screen: Not on file  Housing: Low Risk (03/06/2024)   Epic    Unable to Pay for Housing in the Last Year: No    Number of Times Moved in the Last Year: 0    Homeless in the Last Year: No  Utilities: Not At Risk (03/06/2024)   Epic    Threatened with loss of utilities: No  Health Literacy: Not on file    Code Status   Code Status: Full Code   Vina Dasen, NP-C   04/22/2024, 3:53 PM  ---------------------------------------------------------------------------  I have taken a history, reviewed the chart and examined the patient. I performed a substantive portion of this encounter, including complete performance of at least one of the key components, in conjunction with the APP. 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 She is hemodynamically stable and has had intermittent black stools off and on which is normal for her, but no significant increase in melanic stool recently.  She was apparently followed by [facility] gastro[facility]ology, but apparently was dismissed from their practice. She is followed by Dr. Lanny in oncology and has received multiple IV iron  infusions through their office, as well as bevacizumab .  She has had multiple upper endoscopies in the past few years in which AVMs and other lesions were treated. She had a capsule endoscopy in 2021 which did show a few intestinal AVMs.  I would like to reassess her small bowel burden with a another pill camera.  If she has numerous AVMs, it may be worth trying to get octreotide  or thalidomide approved. Will repeat upper endoscopy and we will plan to treat any high risk for bleeding AVMs found in the upper GI tract.  We will plan to endoscopically place the pill camera at the time of her upper endoscopy.  Clear liquid diet this evening, n.p.o. after midnight  Rhylen Pulido E. Stacia, MD [facility]  High complex medical decision making (this includes chart review, review of results, face-to-face time used for counseling as well as treatment plan and follow-up. The patient was provided an opportunity to ask questions and all were answered. The patient agreed with the plan and demonstrated an understanding of the instructions    "

## 2024-04-22 NOTE — Assessment & Plan Note (Signed)
 Continue as needed Xanax , trazodone 

## 2024-04-22 NOTE — Assessment & Plan Note (Signed)
 Not on any statins, checking lipid panel Patient may need to be started on statins

## 2024-04-22 NOTE — H&P (Signed)
 " History and Physical   Patient: Peggy House                            PCP: Nooruddin, Saad, MD                    DOB: 1960/05/09            DOA: 04/22/2024 FMW:[id number]             DOS: 04/22/2024, 3:38 PM  Nooruddin, Saad, MD  Patient coming from:   HOME  I have personally reviewed patient's medical records, in electronic medical records, including:  [place] link, and care everywhere.    Chief Complaint:   Chief Complaint  Patient presents with   Abnormal Lab    History of present illness:    Peggy House is a 64 year old female history of chronic anemia including iron  deficiency, AVMs, depression/anxiety, chronic right ankle wound, chronic thrombocytopenia, hereditary hemorrhagic telangiectasia (HHT), HTN, HLD, GERD, DM II, osteomyelitis of Rt fibula and tibia (has been on multiple course of antibiotics lately 12/19 was given linezolid  x 2 weeks)  Presented to ED with chief complaint of progressive generalized weakness, fatigue and dizziness.  Reported she has had nosebleed and had a episode of black stool approximately 3 days ago.  She is not on any blood thinners.  She had a history of GI bleed and nosebleeds in the past. Denies of having any recent illnesses.  Denies any passing out, denies any chest pain, shortness of breath.  Denies of any fever or chills.   ED Evaluation: Blood pressure 139/61, pulse 81, temperature 98.8 F (37.1 C), temperature source Oral, resp. rate 20, height 5' 4 (1.626 m), weight 101.2 kg, SpO2 100%. LABs: Glucose 147, calcium  8.7, AST 11, total iron  16, hemoglobin 4.1, 4.5 Hemoccult positive x 2  3U PRBC blood transfusion was ordered by EDP. Requested that patient to be admitted for further evaluation GI has been consulted    Patient Denies having: Fever, Chills, Cough, SOB, Chest Pain, Abd pain, N/V/D, headache, dizziness, lightheadedness,  Dysuria, Joint pain, rash, open wounds  ED Course:   Blood pressure 130/64,  pulse 81, temperature 98.8 F (37.1 C), temperature source Oral, resp. rate 18, height 5' 4 (1.626 m), weight 101.2 kg, SpO2 100%. Abnormal labs;   Review of Systems: As per HPI, otherwise 10 point review of systems were negative.   ----------------------------------------------------------------------------------------------------------------------  Allergies[1]  Home MEDs:  Prior to Admission medications  Medication Sig Start Date End Date Taking? Authorizing Provider  acetaminophen  (TYLENOL ) 325 MG tablet Take 2 tablets (650 mg total) by mouth every 6 (six) hours as needed. Patient taking differently: Take 650 mg by mouth every 6 (six) hours as needed for mild pain (pain score 1-3). 05/19/23  Yes Raulkar, Sven SQUIBB, MD  ALPRAZolam  (XANAX ) 0.5 MG tablet Take 0.5 mg by mouth at bedtime.   Yes [provider]  amLODipine  (NORVASC ) 10 MG tablet Take 1 tablet (10 mg total) by mouth daily. 06/10/23 06/09/24 Yes Gregary Sharper, MD  citalopram  (CELEXA ) 20 MG tablet Take 1 tablet (20 mg total) by mouth daily. 08/27/23  Yes Nooruddin, Saad, MD  gabapentin  (NEURONTIN ) 100 MG capsule Take 1 capsule (100 mg total) by mouth at bedtime. Patient taking differently: Take 100 mg by mouth daily as needed (acid reflux). 07/04/23  Yes Raulkar, Sven SQUIBB, MD  leptospermum manuka honey (MEDIHONEY) PSTE paste Apply 1 Application  topically daily. 12/28/23  Yes Tobie Gaines, DO  linezolid  (ZYVOX ) 600 MG tablet Take 1 tablet (600 mg total) by mouth 2 (two) times daily for 14 days. 04/21/24 05/05/24 Yes Dennise Kingsley, MD  lisinopril  (ZESTRIL ) 20 MG tablet Take 1 tablet (20 mg total) by mouth daily. 06/23/23  Yes Nooruddin, Saad, MD  oxyCODONE  (ROXICODONE ) 5 MG immediate release tablet Take 1 tablet (5 mg total) by mouth every 4 (four) hours as needed (for post op pain). 03/16/24  Yes Jordan, Jesse J, PA-C  pantoprazole  (PROTONIX ) 40 MG tablet Take 1 tablet (40 mg total) by mouth 2 (two) times daily. TAKE 1 TABLET(40  MG) BY MOUTH TWICE DAILY 03/07/24 05/06/24 Yes Leotis Bogus, MD  traZODone  (DESYREL ) 50 MG tablet Take 1 tablet (50 mg total) by mouth at bedtime. Patient taking differently: Take 50 mg by mouth at bedtime as needed for sleep. 03/18/24  Yes Nooruddin, Saad, MD  aminocaproic  acid (AMICAR ) 500 MG tablet Take 2 tablets (1,000 mg total) by mouth every 12 (twelve) hours. Patient not taking: No sig reported 02/18/24   Lanny Callander, MD  cefadroxil  (DURICEF) 500 MG capsule Take 1 capsule (500 mg total) by mouth 2 (two) times daily. Patient not taking: Reported on 04/22/2024 03/16/24   Jordan, Jesse J, PA-C  lidocaine  4 % Place 1 patch onto the skin daily. Remove & Discard patch within 12 hours or as directed by MD Patient not taking: No sig reported 12/27/23   Tobie Gaines, DO    PRN MEDs: acetaminophen  **OR** acetaminophen , bisacodyl , hydrALAZINE , HYDROmorphone  (DILAUDID ) injection, ipratropium, ondansetron  **OR** ondansetron  (ZOFRAN ) IV, oxyCODONE , senna-docusate, sodium phosphate , traZODone   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   no meds, diet controlled, does not check blood sugar    Past Surgical History:  Procedure Laterality Date   ABDOMINAL HYSTERECTOMY     APPLICATION OF WOUND VAC Right 07/28/2023   Procedure: APPLICATION, WOUND VAC;  Surgeon: Lowery Estefana RAMAN, DO;  Location: [facility] OR;  Service: Plastics;  Laterality: Right;   APPLICATION OF WOUND VAC Right 03/16/2024   Procedure: APPLICATION, WOUND VAC;   Surgeon: Elsa Lonni SAUNDERS, MD;  Location: [facility] OR;  Service: Orthopedics;  Laterality: Right;   APPLICATION, SKIN SUBSTITUTE Right 07/28/2023   Procedure: APPLICATION, SKIN SUBSTITUTE;  Surgeon: Lowery Estefana RAMAN, DO;  Location: [facility] OR;  Service: Plastics;  Laterality: Right;   CARPAL TUNNEL RELEASE  05/13/2011   Procedure: CARPAL TUNNEL RELEASE;  Surgeon: Eva Elsie Herring, MD;  Location: [facility];  Service: Orthopedics;  Laterality: Left;   COLONOSCOPY N/A 03/02/2020   Procedure: COLONOSCOPY;  Surgeon: Donnald Charleston, MD;  Location: [facility] ENDOSCOPY;  Service: Endoscopy;  Laterality: N/A;   COLONOSCOPY WITH PROPOFOL  N/A 04/28/2014   Procedure: COLONOSCOPY WITH PROPOFOL ;  Surgeon: Charleston Donnald GAILS, MD;  Location[facility] ENDOSCOPY;  Service: Endoscopy;  Laterality: N/A;   DG TOES*L*  05/16/2008   rt   DILATION AND CURETTAGE  OF UTERUS     ENTEROSCOPY N/A 10/17/2017   Procedure: ENTEROSCOPY;  Surgeon: Elicia Claw, MD;  Location: [facility] ENDOSCOPY;  Service: Gastroenterology;  Laterality: N/A;   ESOPHAGOGASTRODUODENOSCOPY N/A 04/10/2014   Procedure: ESOPHAGOGASTRODUODENOSCOPY (EGD);  Surgeon: Jerrell KYM Sol, MD;  Location: [facility] ENDOSCOPY;  Service: Endoscopy;  Laterality: N/A;   ESOPHAGOGASTRODUODENOSCOPY N/A 05/10/2017   Procedure: ESOPHAGOGASTRODUODENOSCOPY (EGD);  Surgeon: Donnald Charleston, MD;  Location: [facility] ENDOSCOPY;  Service: Endoscopy;  Laterality: N/A;   ESOPHAGOGASTRODUODENOSCOPY N/A 09/22/2017   Procedure: ESOPHAGOGASTRODUODENOSCOPY (EGD);  Surgeon: Rosalie Kitchens, MD;  Location: [facility] ENDOSCOPY;  Service: Endoscopy;  Laterality: N/A;  bedside   ESOPHAGOGASTRODUODENOSCOPY N/A 03/02/2020   Procedure: ESOPHAGOGASTRODUODENOSCOPY (EGD);  Surgeon: Donnald Charleston, MD;  Location: [facility];  Service: Endoscopy;  Laterality: N/A;   ESOPHAGOGASTRODUODENOSCOPY N/A 11/03/2020   Procedure: ESOPHAGOGASTRODUODENOSCOPY (EGD);  Surgeon: Sol Jerrell, MD;  Location: [facility]  ENDOSCOPY;  Service: Endoscopy;  Laterality: N/A;   ESOPHAGOGASTRODUODENOSCOPY N/A 04/12/2022   Procedure: ESOPHAGOGASTRODUODENOSCOPY (EGD);  Surgeon: Rosalie Kitchens, MD;  Location: [facility];  Service: Gastroenterology;  Laterality: N/A;   ESOPHAGOGASTRODUODENOSCOPY N/A 05/27/2022   Procedure: ESOPHAGOGASTRODUODENOSCOPY (EGD);  Surgeon: Burnette Fallow, MD;  Location: [facility];  Service: Gastroenterology;  Laterality: N/A;   ESOPHAGOGASTRODUODENOSCOPY N/A 09/27/2022   Procedure: ESOPHAGOGASTRODUODENOSCOPY (EGD);  Surgeon: Kriss Estefana DEL, DO;  Location: [facility] ENDOSCOPY;  Service: Gastroenterology;  Laterality: N/A;   ESOPHAGOGASTRODUODENOSCOPY N/A 03/06/2024   Procedure: EGD (ESOPHAGOGASTRODUODENOSCOPY);  Surgeon: San Sandor GAILS, DO;  Location: [facility]OSCOPY;  Service: Gastroenterology;  Laterality: N/A;   ESOPHAGOGASTRODUODENOSCOPY (EGD) WITH PROPOFOL  N/A 04/27/2014   Procedure: ESOPHAGOGASTRODUODENOSCOPY (EGD) WITH PROPOFOL ;  Surgeon: Charleston Donnald GAILS, MD;  Location: [facility] ENDOSCOPY;  Service: Endoscopy;  Laterality: N/A;  possible apc   ESOPHAGOGASTRODUODENOSCOPY (EGD) WITH PROPOFOL  N/A 09/30/2017   Procedure: ESOPHAGOGASTRODUODENOSCOPY (EGD) WITH PROPOFOL ;  Surgeon: Saintclair Jasper, MD;  Location: [facility] ENDOSCOPY;  Service: Gastroenterology;  Laterality: N/A;   ESOPHAGOGASTRODUODENOSCOPY (EGD) WITH PROPOFOL  N/A 10/01/2017   Procedure: ESOPHAGOGASTRODUODENOSCOPY (EGD) WITH PROPOFOL ;  Surgeon: Saintclair Jasper, MD;  Location: [facility] ENDOSCOPY;  Service: Gastroenterology;  Laterality: N/A;   ESOPHAGOGASTRODUODENOSCOPY (EGD) WITH PROPOFOL  N/A 10/08/2017   Procedure: ESOPHAGOGASTRODUODENOSCOPY (EGD) WITH PROPOFOL ;  Surgeon: Elicia Claw, MD;  Location: [facility] ENDOSCOPY;  Service: Gastroenterology;  Laterality: N/A;   ESOPHAGOGASTRODUODENOSCOPY (EGD) WITH PROPOFOL  N/A 10/17/2017   Procedure: ESOPHAGOGASTRODUODENOSCOPY (EGD) WITH PROPOFOL ;  Surgeon: Elicia Claw, MD;  [facility] ENDOSCOPY;  Service:  Gastroenterology;  Laterality: N/A;   ESOPHAGOGASTRODUODENOSCOPY (EGD) WITH PROPOFOL  N/A 10/19/2017   Procedure: ESOPHAGOGASTRODUODENOSCOPY (EGD) WITH PROPOFOL ;  Surgeon: Elicia Claw, MD;  [facility];  Service: Gastroenterology;  Laterality: N/A;   ESOPHAGOGASTRODUODENOSCOPY (EGD) WITH PROPOFOL  N/A 12/04/2018   Procedure: ESOPHAGOGASTRODUODENOSCOPY (EGD) WITH PROPOFOL ;  Surgeon: Sol Jerrell, MD;  Location: [facility] ENDOSCOPY;  Service: Endoscopy;  Laterality: N/A;   GIVENS CAPSULE STUDY N/A 10/02/2017   Procedure: GIVENS CAPSULE STUDY;  Surgeon: Saintclair Jasper, MD;  Location: [facility] ENDOSCOPY;  Service: Gastroenterology;  Laterality: N/A;   GIVENS CAPSULE STUDY N/A 10/08/2017   Procedure: GIVENS CAPSULE STUDY;  Surgeon: Elicia Claw, MD;  Location: [facility]OS[facility];  Service: Gastroenterology;  Laterality: N/A;  endoscopic placement of capsule   GIVENS CAPSULE STUDY N/A 03/02/2020   Procedure: GIVENS CAPSULE STUDY;  Surgeon: Donnald Charleston, MD;  Location: [facility] ENDOSCOPY;  Service: Endoscopy;  Laterality: N/A;   HARDWARE REMOVAL Right 07/15/2023   Procedure: REMOVAL, HARDWARE;  Surgeon: Sherida Adine BROCKS, MD;  Location: [facility] OR;  Service: Orthopedics;  Laterality: Right;   HEMOSTASIS CLIP PLACEMENT  12/04/2018   Procedure:  HEMOSTASIS CLIP PLACEMENT;  Surgeon: Dianna Specking, MD;  Location: [facility] ENDOSCOPY;  Service: Endoscopy;;   HEMOSTASIS CLIP PLACEMENT  05/27/2022   Procedure: HEMOSTASIS CLIP PLACEMENT;  Surgeon: Burnette Fallow, MD;  Location: [facility];  Service: Gastroenterology;;   HEMOSTASIS CLIP PLACEMENT  09/27/2022   Procedure: HEMOSTASIS CLIP PLACEMENT;  Surgeon: Kriss Estefana DEL, DO;  Location[facility];  Service: Gastroenterology;;   HEMOSTASIS CONTROL  05/27/2022   Procedure: HEMOSTASIS CONTROL;  Surgeon: Burnette Fallow, MD;  Location: [facility];  Service: Gastroenterology;;   HOT HEMOSTASIS N/A 04/27/2014   Procedure: HOT HEMOSTASIS (ARGON PLASMA  COAGULATION/BICAP);  Surgeon: Lamar Donnald GAILS, MD;  Location: [facility] ENDOSCOPY;  Service: Endoscopy;  Laterality: N/A;   HOT HEMOSTASIS N/A 09/30/2017   Procedure: HOT HEMOSTASIS (ARGON PLASMA COAGULATION/BICAP);  Surgeon: Saintclair Jasper, MD;  Location: [facility] ENDOSCOPY;  Service: Gastroenterology;  Laterality: N/A;   HOT HEMOSTASIS N/A 10/01/2017   Procedure: HOT HEMOSTASIS (ARGON PLASMA COAGULATION/BICAP);  Surgeon: Saintclair Jasper, MD;  Location: [facility] ENDOSCOPY;  Service: Gastroenterology;  Laterality: N/A;   HOT HEMOSTASIS N/A 10/17/2017   Procedure: HOT HEMOSTASIS (ARGON PLASMA COAGULATION/BICAP);  Surgeon: Elicia Claw, MD;  Location: [facility] ENDOSCOPY;  Service: Gastroenterology;  Laterality: N/A;   HOT HEMOSTASIS N/A 10/19/2017   Procedure: HOT HEMOSTASIS (ARGON PLASMA COAGULATION/BICAP);  Surgeon: Elicia Claw, MD;  Location: [facility] ENDOSCOPY;  Service: Gastroenterology;  Laterality: N/A;   HOT HEMOSTASIS N/A 03/02/2020   Procedure: HOT HEMOSTASIS (ARGON PLASMA COAGULATION/BICAP);  Surgeon: Donnald Lamar, MD;  Location: [facility];  Service: Endoscopy;  Laterality: N/A;   HOT HEMOSTASIS N/A 04/12/2022   Procedure: HOT HEMOSTASIS (ARGON PLASMA COAGULATION/BICAP);  Surgeon: Rosalie Kitchens, MD;  Location: [facility];  Service: Gastroenterology;  Laterality: N/A;   INCISION AND DRAINAGE OF DEEP ABSCESS, ANKLE Right 03/16/2024   Procedure: INCISION AND DRAINAGE OF DEEP ABSCESS, ANKLE;  Surgeon: Elsa Lonni SAUNDERS, MD;  Location: [facility];  Service: Orthopedics;  Laterality: Right;   INCISION AND DRAINAGE OF WOUND Right 07/21/2023   Procedure: IRRIGATION AND DEBRIDEMENT WOUND;  Surgeon: Sherida Adine BROCKS, MD;  Location: [facility];  Service: Orthopedics;  Laterality: Right;  RIGHT ANKLE WOUND I&D   INCISION AND DRAINAGE OF WOUND Right 07/28/2023   Procedure: IRRIGATION AND DEBRIDEMENT WOUND;  Surgeon: Lowery Estefana RAMAN, DO;  Location: [facility] OR;  Service: Plastics;  Laterality: Right;  rrigation and debridement of  right ankle wound with placement of myriad and wound vac   IR IMAGING GUIDED PORT INSERTION  07/08/2018   IRRIGATION AND DEBRIDEMENT POSTERIOR HIP Right 07/15/2023   Procedure: IRRIGATION AND DEBRIDEMENT, OPEN FRACTURE;  Surgeon: Sherida Adine BROCKS, MD;  Location: [facility];  Service: Orthopedics;  Laterality: Right;   L shoulder Surgery  04/15/2009   ORIF ANKLE FRACTURE Right 04/24/2023   Procedure: OPEN REDUCTION INTERNAL FIXATION (ORIF) ANKLE FRACTURE;  Surgeon: Sherida Adine BROCKS, MD;  Location: [facility];  Service: Orthopedics;  Laterality: Right;   POLYPECTOMY  03/02/2020   Procedure: POLYPECTOMY;  Surgeon: Donnald Lamar, MD;  Location: [facility] ENDOS[facility];  Service: Endoscopy;;   SCLEROTHERAPY  11/03/2020   Procedure: MATIAS;  Surgeon: Dianna Specking, MD;  Location: [facility] ENDOSCOPY;  Service: Endoscopy;;   SUBMUCOSAL INJECTION  09/22/2017   Procedure: SUBMUCOSAL INJECTION;  Surgeon: Rosalie Kitchens, MD;  Location: [facility] ENDOSCOPY;  Service: Endoscopy;;   SUBMUCOSAL INJECTION  12/04/2018   Procedure: SUBMUCOSAL INJECTION;  Surgeon: Dianna Specking, MD;  Location: [facility] ENDOSCOPY;  Service: Endoscopy;;     reports that she has been smoking cigarettes. She has a  15 pack-year smoking history. She quit smokeless tobacco use about 45 years ago.  Her smokeless tobacco use included snuff. She reports that she does not currently use drugs. She reports that she does not drink alcohol.   Family History  Problem Relation Age of Onset   Cancer Mother        Ovarian   Ovarian cancer Mother    Diabetes Mother    Kidney disease Mother    Bleeding Disorder Mother    Diabetes type II Sister    Bleeding Disorder Sister    Diabetes Sister    Colon cancer Maternal Grandfather    Crohn's disease Maternal Grandfather    Stomach cancer Maternal Grandmother    Kidney disease Son    Bleeding Disorder Son    Dysmenorrhea Neg Hx     Physical Exam:   Vitals:   04/22/24 1247 04/22/24 1255 04/22/24 1430 04/22/24 1531   BP: 139/61  130/64 (!) 161/66  Pulse: 81   72  Resp: 20  18 18   Temp: 98.8 F (37.1 C)   98.7 F (37.1 C)  TempSrc: Oral   Oral  SpO2: 100%   100%  Weight:  101.2 kg    Height:  5' 4 (1.626 m)     Constitutional: NAD, calm, comfortable Eyes: PERRL, lids and conjunctivae normal ENMT: Mucous membranes are moist. Posterior pharynx clear of any exudate or lesions.Normal dentition.  Neck: normal, supple, no masses, no thyromegaly Respiratory: clear to auscultation bilaterally, no wheezing, no crackles. Normal respiratory effort. No accessory muscle use.  Cardiovascular: Regular rate and rhythm, no murmurs / rubs / gallops. No extremity edema. 2+ pedal pulses. No carotid bruits.  Abdomen: no tenderness, no masses palpated. No hepatosplenomegaly. Bowel sounds positive.  Musculoskeletal: no clubbing / cyanosis. No joint deformity upper and lower extremities. Good ROM, no contractures. Normal muscle tone.  Neurologic: CN II-XII grossly intact. Sensation intact, DTR normal. Strength 5/5 in all 4.  Psychiatric: Normal judgment and insight. Alert and oriented x 3. Normal mood.  Skin: no rashes, lesions, ulcers. No induration Decubitus/ulcers:  Wounds: per nursing documentation         Labs on admission:    I have personally reviewed following labs and imaging studies  CBC: Recent Labs  Lab 04/16/24 1535 04/21/24 1532 04/22/24 1326  WBC 14.9* 11.8* 10.6*  NEUTROABS 13.1* 10,018* 9.1*  HGB 5.3* 4.1* 4.5*  HCT 18.0* 15.0* 16.1*  MCV 85.7 86.7 89.0  PLT 303 354 371   Basic Metabolic Panel: Recent Labs  Lab 04/21/24 1532 04/22/24 1326 04/22/24 1442  NA 141 143  --   K 3.9 3.8  --   CL 110 110  --   CO2 23 24  --   GLUCOSE 89 147*  --   BUN 9 9  --   CREATININE 0.86 0.81  --   CALCIUM  8.4* 8.7*  --   MG  --   --  2.1  PHOS  --   --  2.9   GFR: Estimated Creatinine Clearance: 82.3 mL/min (by C-G formula based on SCr of 0.81 mg/dL). Liver Function Tests: Recent Labs   Lab 04/21/24 1532 04/22/24 1326  AST 9* 11*  ALT 6 5  ALKPHOS  --  92  BILITOT 0.2 <0.2  PROT 6.1 6.2*  ALBUMIN  --  3.8   No results for input(s): LIPASE, AMYLASE in the last 168 hours. No results for input(s): AMMONIA in the last 168 hours. Coagulation Profile: No results  for input(s): INR, PROTIME in the last 168 hours. Cardiac Enzymes: No results for input(s): CKTOTAL, CKMB, CKMBINDEX, TROPONINI in the last 168 hours. BNP (last 3 results) No results for input(s): PROBNP in the last 8760 hours. HbA1C: No results for input(s): HGBA1C in the last 72 hours. CBG: No results for input(s): GLUCAP in the last 168 hours. Lipid Profile: No results for input(s): CHOL, HDL, LDLCALC, TRIG, CHOLHDL, LDLDIRECT in the last 72 hours. Thyroid Function Tests: No results for input(s): TSH, T4TOTAL, FREET4, T3FREE, THYROIDAB in the last 72 hours. Anemia Panel: Recent Labs    04/22/24 1442  FERRITIN 23  TIBC 288  IRON  11*   Urine analysis:    Component Value Date/Time   COLORURINE YELLOW 12/24/2023 1721   APPEARANCEUR HAZY (A) 12/24/2023 1721   LABSPEC 1.015 12/24/2023 1721   PHURINE 5.0 12/24/2023 1721   GLUCOSEU NEGATIVE 12/24/2023 1721   HGBUR NEGATIVE 12/24/2023 1721   BILIRUBINUR NEGATIVE 12/24/2023 1721   KETONESUR NEGATIVE 12/24/2023 1721   PROTEINUR 30 (A) 12/24/2023 1721   UROBILINOGEN 1.0 08/24/2013 2121   NITRITE NEGATIVE 12/24/2023 1721   LEUKOCYTESUR NEGATIVE 12/24/2023 1721    Last A1C:  Lab Results  Component Value Date   HGBA1C 5.0 02/01/2023     Radiologic Exams on Admission:   DG Chest 2 View Result Date: 04/22/2024 CLINICAL DATA:  Shortness of breath EXAM: CHEST - 2 VIEW COMPARISON:  March 04, 2024 FINDINGS: Stable cardiomegaly. Right internal jugular port is unchanged. Both lungs are clear. The visualized skeletal structures are unremarkable. IMPRESSION: No active cardiopulmonary disease. Electronically  Signed   By: Lynwood Landy Raddle M.D.   On: 04/22/2024 14:08    EKG:   Independently reviewed.  Orders placed or performed during the hospital encounter of 04/22/24   EKG 12-Lead   *Note: Due to a large number of results and/or encounters for the requested time period, some results have not been displayed. A complete set of results can be found in Results Review.   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   Epistaxis   AVM (arteriovenous malformation) of small bowel, acquired   Essential hypertension   Diabetes mellitus (HCC)   Assessment and Plan: * Symptomatic anemia Symptomatic anemia - Anemia of chronic disease with acute GI bleed Extensive history of iron  deficiency anemia, with history of thrombocytopenia, and hereditary hemorrhagic telangiectasia (HHT)  Also chronic epistaxis and GI Bleeding from AVM  -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, large amount blood found - Iron  studies total iron  16, TIBC 279, ferritin 31 -on weekly ferric gluconate 250mg , she is not very compliant   Today 04/22/2024  - Workup reveals Hemoccult positive x 2 -Pursuing with 3U PRBC blood transfusion - GI consulted appreciate follow-up   GIB (gastrointestinal bleeding) Occult positive x 2 -Hemoglobin at 4.1, 4.5 (Baseline hemoglobin around 8) -Agree with transfusing 3 units PRBC -Will check an H&H every 6 hours -Gastroenterologist consulted, appreciate further evaluation recommendation     AVM (arteriovenous  malformation) of small bowel, acquired Of AVM with a previous bleed -Appreciate GI input -Continue IV Protonix  40 mg twice daily  Epistaxis Few episode of epistaxis  the past 2 to 3 days, Currently stable, -Platelets normal at 371  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 -Following up closely with infectious disease Dr. Dennise - Follows up in orthopedic office for wound care  -Currently on Zyvox  will be continued Hemodynamically stable, afebrile, normotensive, WBC of 10.6  Thrombocytopenia History of thrombocytopenia  currently platelets are stable at 303, 354, 371 today Monitoring  Depression with anxiety History of depression with anxiety continue as needed Xanax , Continue citalopram   HHT (hereditary hemorrhagic telangiectasia) Previous prolonged hospitalization due to HET, and GI bleed,  S/p embolization of the gastric artery branch on 12/10/2017, cauterization of stomach and 12/03/2018, epinephrine  injection into her ulcer on 11/01/2020, Frequent blood transfusion, with iron ,and bevacizumab    Iron  deficiency anemia History of chronic iron  deficiency  Based on recent studies Total iron  16, UIBC 263, TIBC 279, saturation ratio 6, ferritin 31 -Patient will need iron  supplements --Reports that she follows with hematology office, with routine IV iron  infusion Will recommend close follow-up for rebleeding  Insomnia Continue as needed Xanax , trazodone   HLD (hyperlipidemia) Not on any statins, checking lipid panel Patient may need to be started on statins  Diabetes mellitus (HCC) Per electronic medical record diabetes mellitus -She is not on any diabetic medications Last A1c was 5.0 a year ago -Will monitor  Essential hypertension With severe anemia, monitoring for hypotension -Once stable, continue home  medication: Norvasc , lisinopril  -       Consults called: Gastroenterologist -------------------------------------------------------------------------------------------------------------------------------------------- DVT prophylaxis:  SCDs Start: 04/22/24 1441   Code Status:   Code Status: Full Code   Admission status: Patient will be admitted as Observation, with a greater than 2 midnight length of stay. Level of care: Telemetry   Family Communication:  none at bedside  (The above findings and plan of care has been discussed with patient in detail, the patient expressed understanding and agreement of above plan)  --------------------------------------------------------------------------------------------------------------------------------------------------  Disposition Plan:  Anticipated 1-2 days Status is: Observation The patient remains OBS appropriate and will d/c before 2 midnights.     ----------------------------------------------------------------------------------------------------------------------------------------------------  Time spent:  62  Min.  Was spent seeing and evaluating the patient, reviewing all medical records, drawn plan of care.  SIGNED: Adriana DELENA Grams, MD, FHM. FAAFP. [facility], Pager  (Please use [email or web address] to page/ or secure chat through epic) If 7PM-7AM, please contact night-coverage [email or web address],  04/22/2024, 3:38 PM     [1]  Allergies Allergen Reactions   Feraheme  [Ferumoxytol ] Other (See Comments)    Muscle tetany, encephalopathy, fatigue, nausea (reaction to injection)   Nsaids Other (See Comments)    Stomach bleeding episodes; Tylenol  is OK   Wasp Venom Anaphylaxis   Tomato Hives   Iron  (Ferrous Sulfate ) [Ferrous Sulfate  Er] Other (See Comments)    Shock   "

## 2024-04-22 NOTE — Assessment & Plan Note (Addendum)
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 -Following up closely with infectious disease Dr. Dennise - Follows up in orthopedic office for wound care  -Currently on Zyvox  will be continued Hemodynamically stable, afebrile, normotensive, WBC of 10.6

## 2024-04-22 NOTE — ED Provider Triage Note (Signed)
 Emergency Medicine Provider Triage Evaluation Note  Peggy House , a 63 y.o. female  was evaluated in triage.  Pt complains of low hemoglobin.  Patient was called from her primary care doctor that her hemoglobin was 4.1.  Denies any bleeding.  Does have a history of chronic nosebleeds and last 1 was 2 days ago.  Also endorses shortness of breath.  Denies fevers dizziness, headaches.  Patient has a history of hereditary hemorrhagic telangiectasias.    Review of Systems  Positive: Shortness of breath, low hemoglobin Negative: Numbness and tingling, weakness, dizziness, headache, rectal bleeding, nosebleeds  Physical Exam  BP 139/61 (BP Location: Left Arm)   Pulse 81   Temp 98.8 F (37.1 C) (Oral)   Resp 20   Ht 5' 4 (1.626 m)   Wt 101.2 kg   SpO2 100%   BMI 38.28 kg/m  Gen:   Awake, no distress   Resp:  Normal effort  MSK:   Moves extremities without difficulty   Medical Decision Making  Medically screening exam initiated at 1:07 PM.  Appropriate orders placed.  Peggy House was informed that the remainder of the evaluation will be completed by another provider, this initial triage assessment does not replace that evaluation, and the importance of remaining in the ED until their evaluation is complete.  CBC, CMP, type and screen, chest x-ray   Braxton Dubois, PA-C 04/22/24 1313

## 2024-04-22 NOTE — ED Provider Notes (Signed)
 " [place] EMERGENCY DEPARTMENT AT [facility] Provider Note   CSN: [id number] Arrival date & time: 04/22/24  1244     Patient presents with: Abnormal Lab   Peggy House is a 64 y.o. female.  Patient with hereditary hemorrhagic telangiectasia, iron  deficiency anemia presents to emergency room with complaint of low hemoglobin.  Patient reports especially over the last 3 to 4 days she has had fatigue, generalized weakness and she gets short of breath when she is up walking around.  She reports she had a large episode of black stool approximately 3 days ago and over the last 2 to 3 days she has had 1-3 nosebleeds which she reports was a large amount.  She is not on a blood thinner.  She has had history of admission for similar complaint in the past.  Has had recurrent GI bleed and nosebleed.  She does report that she has been on Bevacizumab , but not on that medication due to wound right now. No fever. Saw Pod yesterday with no osteo.     Abnormal Lab      Prior to Admission medications  Medication Sig Start Date End Date Taking? Authorizing Provider  acetaminophen  (TYLENOL ) 325 MG tablet Take 2 tablets (650 mg total) by mouth every 6 (six) hours as needed. Patient taking differently: Take 650 mg by mouth every 6 (six) hours as needed for mild pain (pain score 1-3). 05/19/23  Yes Raulkar, Sven SQUIBB, MD  ALPRAZolam  (XANAX ) 0.5 MG tablet Take 0.5 mg by mouth at bedtime.   Yes [provider]  amLODipine  (NORVASC ) 10 MG tablet Take 1 tablet (10 mg total) by mouth daily. 06/10/23 06/09/24 Yes Gregary Sharper, MD  citalopram  (CELEXA ) 20 MG tablet Take 1 tablet (20 mg total) by mouth daily. 08/27/23  Yes Nooruddin, Saad, MD  gabapentin  (NEURONTIN ) 100 MG capsule Take 1 capsule (100 mg total) by mouth at bedtime. Patient taking differently: Take 100 mg by mouth daily as needed (acid reflux). 07/04/23  Yes Raulkar, Sven SQUIBB, MD  leptospermum manuka honey (MEDIHONEY) PSTE paste  Apply 1 Application topically daily. 12/28/23  Yes Tobie Gaines, DO  linezolid  (ZYVOX ) 600 MG tablet Take 1 tablet (600 mg total) by mouth 2 (two) times daily for 14 days. 04/21/24 05/05/24 Yes Dennise Kingsley, MD  lisinopril  (ZESTRIL ) 20 MG tablet Take 1 tablet (20 mg total) by mouth daily. 06/23/23  Yes Nooruddin, Saad, MD  oxyCODONE  (ROXICODONE ) 5 MG immediate release tablet Take 1 tablet (5 mg total) by mouth every 4 (four) hours as needed (for post op pain). 03/16/24  Yes Jordan, Jesse J, PA-C  pantoprazole  (PROTONIX ) 40 MG tablet Take 1 tablet (40 mg total) by mouth 2 (two) times daily. TAKE 1 TABLET(40 MG) BY MOUTH TWICE DAILY 03/07/24 05/06/24 Yes Leotis Bogus, MD  traZODone  (DESYREL ) 50 MG tablet Take 1 tablet (50 mg total) by mouth at bedtime. Patient taking differently: Take 50 mg by mouth at bedtime as needed for sleep. 03/18/24  Yes Nooruddin, Saad, MD  aminocaproic  acid (AMICAR ) 500 MG tablet Take 2 tablets (1,000 mg total) by mouth every 12 (twelve) hours. Patient not taking: No sig reported 02/18/24   Lanny Callander, MD  cefadroxil  (DURICEF) 500 MG capsule Take 1 capsule (500 mg total) by mouth 2 (two) times daily. Patient not taking: Reported on 04/22/2024 03/16/24   Jordan, Jesse J, PA-C  lidocaine  4 % Place 1 patch onto the skin daily. Remove & Discard patch within 12 hours or as directed  by MD Patient not taking: No sig reported 12/27/23   Tobie Gaines, DO    Allergies: Feraheme  [ferumoxytol ], Nsaids, Wasp venom, Tomato, and Iron  (ferrous sulfate ) [ferrous sulfate  er]    Review of Systems  Constitutional:  Positive for activity change.    Updated Vital Signs BP 139/61 (BP Location: Left Arm)   Pulse 81   Temp 98.8 F (37.1 C) (Oral)   Resp 20   Ht 5' 4 (1.626 m)   Wt 101.2 kg   SpO2 100%   BMI 38.28 kg/m   Physical Exam Vitals and nursing note reviewed.  Constitutional:      General: She is not in acute distress.    Appearance: She is not toxic-appearing.  HENT:     Head:  Normocephalic and atraumatic.  Eyes:     General: No scleral icterus.    Conjunctiva/sclera: Conjunctivae normal.     Comments: Pale conjunctiva  Cardiovascular:     Rate and Rhythm: Normal rate and regular rhythm.     Pulses: Normal pulses.     Heart sounds: Normal heart sounds.  Pulmonary:     Effort: Pulmonary effort is normal. No respiratory distress.     Breath sounds: Normal breath sounds.  Abdominal:     General: Abdomen is flat. Bowel sounds are normal.     Palpations: Abdomen is soft.     Tenderness: There is no abdominal tenderness.  Genitourinary:    Rectum: Guaiac result positive.     Comments: Rectal exam with chaperone present no melena or gross red blood. Skin:    General: Skin is warm and dry.     Findings: No lesion.  Neurological:     General: No focal deficit present.     Mental Status: She is alert and oriented to person, place, and time. Mental status is at baseline.     (all labs ordered are listed, but only abnormal results are displayed) Labs Reviewed  CBC WITH DIFFERENTIAL/PLATELET - Abnormal; Notable for the following components:      Result Value   WBC 10.6 (*)    RBC 1.81 (*)    Hemoglobin 4.5 (*)    HCT 16.1 (*)    MCH 24.9 (*)    MCHC 28.0 (*)    RDW 21.2 (*)    nRBC 0.3 (*)    Neutro Abs 9.1 (*)    All other components within normal limits  COMPREHENSIVE METABOLIC PANEL WITH GFR - Abnormal; Notable for the following components:   Glucose, Bld 147 (*)    Calcium  8.7 (*)    Total Protein 6.2 (*)    AST 11 (*)    All other components within normal limits  POC OCCULT BLOOD, ED - Abnormal; Notable for the following components:   Fecal Occult Bld POSITIVE (*)    All other components within normal limits  EXPECTORATED SPUTUM ASSESSMENT W GRAM STAIN, RFLX TO RESP C  MAGNESIUM   PHOSPHORUS  PROTIME-INR  FERRITIN  IRON  AND TIBC  TYPE AND SCREEN  PREPARE RBC (CROSSMATCH)    EKG: None  Radiology: DG Chest 2 View Result Date:  04/22/2024 CLINICAL DATA:  Shortness of breath EXAM: CHEST - 2 VIEW COMPARISON:  March 04, 2024 FINDINGS: Stable cardiomegaly. Right internal jugular port is unchanged. Both lungs are clear. The visualized skeletal structures are unremarkable. IMPRESSION: No active cardiopulmonary disease. Electronically Signed   By: Lynwood Landy Raddle M.D.   On: 04/22/2024 14:08     .Critical Care  Performed by:  Tavaughn Silguero, Warren SAILOR, PA-C Authorized by: Shermon Warren SAILOR, PA-C   Critical care provider statement:    Critical care time (minutes):  50   Critical care was necessary to treat or prevent imminent or life-threatening deterioration of the following conditions:  Circulatory failure   Critical care was time spent personally by me on the following activities:  Development of treatment plan with patient or surrogate, discussions with consultants, evaluation of patient's response to treatment, examination of patient, ordering and review of laboratory studies, ordering and review of radiographic studies, ordering and performing treatments and interventions, pulse oximetry, re-evaluation of patient's condition and review of old charts   Care discussed with: admitting provider      Medications Ordered in the ED  0.9 %  sodium chloride  infusion (Manually program via Guardrails IV Fluids) (has no administration in time range)  pantoprazole  (PROTONIX ) injection 40 mg (has no administration in time range)  heparin  injection 5,000 Units (has no administration in time range)  sodium chloride  flush (NS) 0.9 % injection 3 mL (has no administration in time range)  0.9 %  sodium chloride  infusion (has no administration in time range)  sodium chloride  flush (NS) 0.9 % injection 3 mL (has no administration in time range)  acetaminophen  (TYLENOL ) tablet 650 mg (has no administration in time range)    Or  acetaminophen  (TYLENOL ) suppository 650 mg (has no administration in time range)  oxyCODONE  (Oxy IR/ROXICODONE ) immediate  release tablet 5 mg (has no administration in time range)  HYDROmorphone  (DILAUDID ) injection 0.5-1 mg (has no administration in time range)  traZODone  (DESYREL ) tablet 25 mg (has no administration in time range)  senna-docusate (Senokot-S) tablet 1 tablet (has no administration in time range)  bisacodyl  (DULCOLAX) EC tablet 5 mg (has no administration in time range)  sodium phosphate  (FLEET) enema 1 enema (has no administration in time range)  ondansetron  (ZOFRAN ) tablet 4 mg (has no administration in time range)    Or  ondansetron  (ZOFRAN ) injection 4 mg (has no administration in time range)  ipratropium (ATROVENT ) nebulizer solution 0.5 mg (has no administration in time range)  hydrALAZINE  (APRESOLINE ) injection 10 mg (has no administration in time range)                                    Medical Decision Making Amount and/or Complexity of Data Reviewed Labs: ordered.  Risk Prescription drug management. Decision regarding hospitalization.   This patient presents to the ED for concern of generalized weakness, this involves an extensive number of treatment options, and is a complaint that carries with it a high risk of complications and morbidity.  The differential diagnosis includes x-ray abnormality, dehydration, sepsis, acute blood loss anemia   Co morbidities that complicate the patient evaluation  Pretension, hyperlipidemia, anemia, HHT, iron  deficiency anemia, history of GI bleed, history of gastric ulcer, history of gastric AVM   Additional history obtained:  Additional history obtained from patient was recently admitted on 03/04/2024 for similar thing of GI bleed and low hemoglobin I do see that patient had EGD and required intervention for two bleeding gastric ulcers    Lab Tests:  I personally interpreted labs.  The pertinent results include:   Patient's hemoglobin found to be 4.5, Hemoccult positive.  I did add on iron  and ferritin. CMP unremarkable.     Imaging Studies ordered:  I ordered imaging studies including chest x-ray I independently visualized and interpreted  imaging which showed no acute finding.  I agree with the radiologist interpretation   Cardiac Monitoring: / EKG:  The patient was maintained on a cardiac monitor.     Consultations Obtained:  I requested consultation with the GI unassigned,  and discussed lab and imaging findings as well as pertinent plan - they recommend: [facility] will see in consult.  Has been admitted to hospital team.    Problem List / ED Course / Critical interventions / Medication management  Patient is presenting to emergency room with complaint of abnormal labs.  She does admit to generalized weakness, fatigue and some shortness of breath with exertion over the last few days.  She does admit to melena.  She does have history of similar including recent admission for HHT, anemia and GI bleeding.  She denies any vomiting of blood.  She denies any heavy vaginal bleeding.  On arrival she is hemodynamically stable.  She she is pale but otherwise well-appearing.  Patient is found to have hemoglobin of 4.5 and is Hemoccult positive.  I have ordered 3 units of red blood cell and Protonix . Patient also has chronic leg wound to right lateral ankle.  She is neurovascularly intact with no purulent drainage and with no surrounding erythema.  She saw podiatry for this yesterday, see photo in chart. Doubt osteo at this time. Follows with pod & ortho.  I ordered medication including Protonix , RBC Consented patient for RBC transfusion.  Reevaluation of the patient after these medicines showed that the patient improved I have reviewed the patients home medicines and have made adjustments as needed        Final diagnoses:  Gastrointestinal hemorrhage, unspecified gastrointestinal hemorrhage type  Symptomatic anemia    ED Discharge Orders     None          Shermon Warren SAILOR,  PA-C 04/22/24 1514    Neysa Caron PARAS, DO 04/22/24 1552  "

## 2024-04-22 NOTE — Assessment & Plan Note (Signed)
 Of AVM with a previous bleed -Appreciate GI input -Continue IV Protonix  40 mg twice daily

## 2024-04-22 NOTE — ED Notes (Signed)
 Called to patients room. Pt profusely bleeding from her nose after coughing. Pt given tissue. Pressure held on nose. EDP Dr. Garrick made aware and asked to come see patient bedside for immediate orders to help patient while this RN went to page internal medical provider on-call. Secretary made aware to page the on-call provider. Dr. Garrick bedside with another RN to help hold pressure on patients nose. Per Dr. Garrick continue to hold pressure for 15-20 minutes to help stop the bleeding. During this time. Dr. Charlton called and gave verbal orders for Afrin and agreed with the EDP to continue to hold pressure for 15-20 minutes. Instructions to put the Afrin on a gauze/cotton ball and insert into patients left nare. Afrin retrieved and applied to gauze and inserted into patients left nare. After inserting the gauze patients right nare began to bleed. Afrin gauze inserted into patient right nare and pressure held on the nose. Asked patient about pressure comfort level. Pt states she is not in pain and can breathe through her mouth. Manuel pressure held on the bridge of patient nose by this [employer]. After 15 minutes pressure removed. No active bleeding with the gauze in patients nose. Dr. Garrick bedside to reassess patient. Dr. Garrick removed gauze from patients nose. No active bleeding. Pt reports feeling okay.

## 2024-04-22 NOTE — Telephone Encounter (Signed)
 Peggy House, no answer. Left HIPAA compliant voicemail requesting callback.   Called [place] (son), no answer and no voicemail set up.   Called Fonda (son), number no longer belongs to him.   Called Nanetta (sister), she is not with House and does not have an alternate way to contact her. Discussed our office is urgently trying to get in touch with her regarding low hemoglobin and need for transfusion.   Called Daril (son), no answer. Left HIPAA compliant voicemail requesting callback.   Called Kaydince again, she answered. Discussed critically low hemoglobin. She states she has someone that can drive her to [facility]. Emphasized importance of going as soon as possible for transfusion. She denies feeling clammy, cold, or lightheaded, but does endorse some shortness of breath.   Lieutenant Abarca, BSN, RN

## 2024-04-22 NOTE — Telephone Encounter (Signed)
 Pt called stating she went to the doctor's office yesterday and labs were drawn. Pt stated she received a call today from them stating her Hgb is 5.6; therefore, they want her to go to the ED for a blood transfusion. Pt stated she will need to cancel her appt today because she's going to the ED.  Cancelled the pt's appts.

## 2024-04-22 NOTE — Telephone Encounter (Signed)
 Thank you Peggy House.

## 2024-04-22 NOTE — ED Notes (Signed)
 Verified with CCMD that patient on is on cardiac telemetry

## 2024-04-22 NOTE — Assessment & Plan Note (Addendum)
 History of depression with anxiety continue as needed Xanax , Continue citalopram 

## 2024-04-22 NOTE — Assessment & Plan Note (Addendum)
 History of chronic iron  deficiency  Based on recent studies Total iron  16, UIBC 263, TIBC 279, saturation ratio 6, ferritin 31 -Patient will need iron  supplements --Reports that she follows with hematology office, with routine IV iron  infusion Will recommend close follow-up for rebleeding

## 2024-04-23 ENCOUNTER — Inpatient Hospital Stay (HOSPITAL_COMMUNITY): Admitting: Anesthesiology

## 2024-04-23 ENCOUNTER — Other Ambulatory Visit: Payer: Self-pay

## 2024-04-23 ENCOUNTER — Encounter (HOSPITAL_COMMUNITY)
Admission: EM | Disposition: A | Discharge status: Skilled Nursing Facility | Payer: Self-pay | Source: Ambulatory Visit | Attending: Family Medicine

## 2024-04-23 ENCOUNTER — Encounter (HOSPITAL_COMMUNITY): Payer: Self-pay | Admitting: Family Medicine

## 2024-04-23 ENCOUNTER — Inpatient Hospital Stay (HOSPITAL_COMMUNITY)

## 2024-04-23 DIAGNOSIS — Z978 Presence of other specified devices: Secondary | ICD-10-CM

## 2024-04-23 DIAGNOSIS — K3189 Other diseases of stomach and duodenum: Secondary | ICD-10-CM

## 2024-04-23 DIAGNOSIS — K31819 Angiodysplasia of stomach and duodenum without bleeding: Secondary | ICD-10-CM

## 2024-04-23 DIAGNOSIS — D509 Iron deficiency anemia, unspecified: Secondary | ICD-10-CM | POA: Diagnosis not present

## 2024-04-23 DIAGNOSIS — R040 Epistaxis: Secondary | ICD-10-CM | POA: Diagnosis not present

## 2024-04-23 DIAGNOSIS — M86161 Other acute osteomyelitis, right tibia and fibula: Secondary | ICD-10-CM | POA: Diagnosis not present

## 2024-04-23 DIAGNOSIS — J9601 Acute respiratory failure with hypoxia: Secondary | ICD-10-CM

## 2024-04-23 DIAGNOSIS — I10 Essential (primary) hypertension: Secondary | ICD-10-CM | POA: Diagnosis not present

## 2024-04-23 DIAGNOSIS — E119 Type 2 diabetes mellitus without complications: Secondary | ICD-10-CM

## 2024-04-23 DIAGNOSIS — F418 Other specified anxiety disorders: Secondary | ICD-10-CM

## 2024-04-23 DIAGNOSIS — D500 Iron deficiency anemia secondary to blood loss (chronic): Secondary | ICD-10-CM | POA: Diagnosis not present

## 2024-04-23 DIAGNOSIS — Z9911 Dependence on respirator [ventilator] status: Secondary | ICD-10-CM

## 2024-04-23 HISTORY — PX: GIVENS CAPSULE STUDY: SHX5432

## 2024-04-23 HISTORY — PX: ESOPHAGOGASTRODUODENOSCOPY: SHX5428

## 2024-04-23 HISTORY — PX: HEMOSTASIS CLIP PLACEMENT: SHX6857

## 2024-04-23 HISTORY — PX: HOT HEMOSTASIS: SHX5433

## 2024-04-23 LAB — HEMOGLOBIN AND HEMATOCRIT, BLOOD
HCT: 19.7 % — ABNORMAL LOW (ref 36.0–46.0)
HCT: 25.8 % — ABNORMAL LOW (ref 36.0–46.0)
Hemoglobin: 5.9 g/dL — CL (ref 12.0–15.0)
Hemoglobin: 8.1 g/dL — ABNORMAL LOW (ref 12.0–15.0)

## 2024-04-23 LAB — POCT I-STAT 7, (LYTES, BLD GAS, ICA,H+H)
Acid-base deficit: 5 mmol/L — ABNORMAL HIGH (ref 0.0–2.0)
Acid-base deficit: 5 mmol/L — ABNORMAL HIGH (ref 0.0–2.0)
Bicarbonate: 21.8 mmol/L (ref 20.0–28.0)
Bicarbonate: 21.6 mmol/L (ref 20.0–28.0)
Calcium, Ion: 1.23 mmol/L (ref 1.15–1.40)
Calcium, Ion: 1.21 mmol/L (ref 1.15–1.40)
HCT: 25 % — ABNORMAL LOW (ref 36.0–46.0)
HCT: 23 % — ABNORMAL LOW (ref 36.0–46.0)
Hemoglobin: 8.5 g/dL — ABNORMAL LOW (ref 12.0–15.0)
Hemoglobin: 7.8 g/dL — ABNORMAL LOW (ref 12.0–15.0)
O2 Saturation: 91 %
O2 Saturation: 93 %
Patient temperature: 97.8
Potassium: 3.9 mmol/L (ref 3.5–5.1)
Potassium: 3.9 mmol/L (ref 3.5–5.1)
Sodium: 143 mmol/L (ref 135–145)
Sodium: 143 mmol/L (ref 135–145)
TCO2: 23 mmol/L (ref 22–32)
TCO2: 23 mmol/L (ref 22–32)
pCO2 arterial: 49.7 mmHg — ABNORMAL HIGH (ref 32–48)
pCO2 arterial: 43.5 mmHg (ref 32–48)
pH, Arterial: 7.25 — ABNORMAL LOW (ref 7.35–7.45)
pH, Arterial: 7.302 — ABNORMAL LOW (ref 7.35–7.45)
pO2, Arterial: 71 mmHg — ABNORMAL LOW (ref 83–108)
pO2, Arterial: 73 mmHg — ABNORMAL LOW (ref 83–108)

## 2024-04-23 LAB — PROTIME-INR
INR: 1.2 (ref 0.8–1.2)
INR: 1.2 (ref 0.8–1.2)
INR: 1.2 (ref 0.8–1.2)
Prothrombin Time: 15.7 s — ABNORMAL HIGH (ref 11.4–15.2)
Prothrombin Time: 16.2 s — ABNORMAL HIGH (ref 11.4–15.2)
Prothrombin Time: 15.7 s — ABNORMAL HIGH (ref 11.4–15.2)

## 2024-04-23 LAB — CBC
HCT: 20.2 % — ABNORMAL LOW (ref 36.0–46.0)
HCT: 26 % — ABNORMAL LOW (ref 36.0–46.0)
HCT: 27.7 % — ABNORMAL LOW (ref 36.0–46.0)
Hemoglobin: 6.2 g/dL — CL (ref 12.0–15.0)
Hemoglobin: 8.3 g/dL — ABNORMAL LOW (ref 12.0–15.0)
Hemoglobin: 8.5 g/dL — ABNORMAL LOW (ref 12.0–15.0)
MCH: 27 pg (ref 26.0–34.0)
MCH: 27.5 pg (ref 26.0–34.0)
MCH: 27.5 pg (ref 26.0–34.0)
MCHC: 30.7 g/dL (ref 30.0–36.0)
MCHC: 31.9 g/dL (ref 30.0–36.0)
MCHC: 30.7 g/dL (ref 30.0–36.0)
MCV: 87.8 fL (ref 80.0–100.0)
MCV: 86.1 fL (ref 80.0–100.0)
MCV: 89.6 fL (ref 80.0–100.0)
Platelets: 271 K/uL (ref 150–400)
Platelets: 293 K/uL (ref 150–400)
Platelets: 265 K/uL (ref 150–400)
RBC: 2.3 MIL/uL — ABNORMAL LOW (ref 3.87–5.11)
RBC: 3.02 MIL/uL — ABNORMAL LOW (ref 3.87–5.11)
RBC: 3.09 MIL/uL — ABNORMAL LOW (ref 3.87–5.11)
RDW: 18.4 % — ABNORMAL HIGH (ref 11.5–15.5)
RDW: 17.3 % — ABNORMAL HIGH (ref 11.5–15.5)
RDW: 18 % — ABNORMAL HIGH (ref 11.5–15.5)
WBC: 9.2 K/uL (ref 4.0–10.5)
WBC: 11 K/uL — ABNORMAL HIGH (ref 4.0–10.5)
WBC: 11.7 K/uL — ABNORMAL HIGH (ref 4.0–10.5)
nRBC: 0.3 % — ABNORMAL HIGH (ref 0.0–0.2)
nRBC: 0.3 % — ABNORMAL HIGH (ref 0.0–0.2)
nRBC: 0.3 % — ABNORMAL HIGH (ref 0.0–0.2)

## 2024-04-23 LAB — APTT
aPTT: 33 s (ref 24–36)
aPTT: 32 s (ref 24–36)

## 2024-04-23 LAB — HEMOGLOBIN A1C
Hgb A1c MFr Bld: 4.6 % — ABNORMAL LOW (ref 4.8–5.6)
Mean Plasma Glucose: 85.32 mg/dL

## 2024-04-23 LAB — GLUCOSE, CAPILLARY: Glucose-Capillary: 98 mg/dL (ref 70–99)

## 2024-04-23 LAB — BASIC METABOLIC PANEL WITH GFR
Anion gap: 9 (ref 5–15)
Anion gap: 10 (ref 5–15)
BUN: 10 mg/dL (ref 8–23)
BUN: 8 mg/dL (ref 8–23)
CO2: 21 mmol/L — ABNORMAL LOW (ref 22–32)
CO2: 16 mmol/L — ABNORMAL LOW (ref 22–32)
Calcium: 8.3 mg/dL — ABNORMAL LOW (ref 8.9–10.3)
Calcium: 8.2 mg/dL — ABNORMAL LOW (ref 8.9–10.3)
Chloride: 111 mmol/L (ref 98–111)
Chloride: 113 mmol/L — ABNORMAL HIGH (ref 98–111)
Creatinine, Ser: 0.82 mg/dL (ref 0.44–1.00)
Creatinine, Ser: 0.83 mg/dL (ref 0.44–1.00)
GFR, Estimated: >60 mL/min
GFR, Estimated: >60 mL/min
Glucose, Bld: 105 mg/dL — ABNORMAL HIGH (ref 70–99)
Glucose, Bld: 92 mg/dL (ref 70–99)
Potassium: 3.8 mmol/L (ref 3.5–5.1)
Potassium: 4.4 mmol/L (ref 3.5–5.1)
Sodium: 142 mmol/L (ref 135–145)
Sodium: 140 mmol/L (ref 135–145)

## 2024-04-23 LAB — FIBRINOGEN
Fibrinogen: 446 mg/dL (ref 210–475)
Fibrinogen: 609 mg/dL — ABNORMAL HIGH (ref 210–475)

## 2024-04-23 LAB — PREPARE RBC (CROSSMATCH)

## 2024-04-23 MED ORDER — ISOPROTERENOL HCL 0.2 MG/ML IJ SOLN
2.0000 ug/min | INTRAVENOUS | Status: DC
  Administered 2024-04-23 – 2024-04-24 (×2): 2 ug/min via INTRAVENOUS
  Filled 2024-04-23 (×4): qty 5

## 2024-04-23 MED ORDER — AMOXICILLIN-POT CLAVULANATE 875-125 MG PO TABS
1.0000 | ORAL_TABLET | Freq: Two times a day (BID) | ORAL | Status: DC
  Filled 2024-04-23: qty 1

## 2024-04-23 MED ORDER — ROCURONIUM BROMIDE 100 MG/10ML IV SOLN
INTRAVENOUS | Status: DC | PRN
  Administered 2024-04-23: 30 mg via INTRAVENOUS

## 2024-04-23 MED ORDER — VANCOMYCIN HCL 1500 MG/300ML IV SOLN
1500.0000 mg | INTRAVENOUS | Status: DC
  Administered 2024-04-24 – 2024-04-26 (×3): 1500 mg via INTRAVENOUS
  Filled 2024-04-23 (×5): qty 300

## 2024-04-23 MED ORDER — PROPOFOL 1000 MG/100ML IV EMUL
0.0000 ug/kg/min | INTRAVENOUS | Status: DC
  Administered 2024-04-23: 30 ug/kg/min via INTRAVENOUS
  Administered 2024-04-23 – 2024-04-24 (×3): 40 ug/kg/min via INTRAVENOUS
  Filled 2024-04-23 (×4): qty 100

## 2024-04-23 MED ORDER — TRANEXAMIC ACID 1000 MG/10ML IV SOLN
INTRAVENOUS | Status: DC | PRN
  Administered 2024-04-23: 1000 mg via INTRAVENOUS

## 2024-04-23 MED ORDER — SODIUM CHLORIDE 0.9% IV SOLUTION: Freq: Once | INTRAVENOUS | Status: AC

## 2024-04-23 MED ORDER — TRANEXAMIC ACID-NACL 1000-0.7 MG/100ML-% IV SOLN
INTRAVENOUS | Status: AC
  Filled 2024-04-23: qty 100

## 2024-04-23 MED ORDER — PHENYLEPHRINE HCL (PRESSORS) 10 MG/ML IV SOLN
INTRAVENOUS | Status: DC | PRN
  Administered 2024-04-23: 160 ug via INTRAVENOUS

## 2024-04-23 MED ORDER — CHLORHEXIDINE GLUCONATE CLOTH 2 % EX PADS
6.0000 | MEDICATED_PAD | Freq: Every day | CUTANEOUS | Status: DC
  Administered 2024-04-23 – 2024-05-13 (×21): 6 via TOPICAL

## 2024-04-23 MED ORDER — ALPRAZOLAM 0.25 MG PO TABS
0.5000 mg | ORAL_TABLET | Freq: Every evening | ORAL | Status: DC | PRN
  Administered 2024-04-24 – 2024-05-13 (×14): 0.5 mg via ORAL
  Filled 2024-04-23: qty 1
  Filled 2024-04-23: qty 2
  Filled 2024-04-23: qty 1
  Filled 2024-04-23 (×3): qty 2
  Filled 2024-04-23: qty 1
  Filled 2024-04-23 (×2): qty 2
  Filled 2024-04-23 (×5): qty 1

## 2024-04-23 MED ORDER — SODIUM CHLORIDE 0.9% FLUSH: 10.0000 mL | INTRAVENOUS | Status: DC | PRN

## 2024-04-23 MED ORDER — PROPOFOL 10 MG/ML IV BOLUS
INTRAVENOUS | Status: DC | PRN
  Administered 2024-04-23: 100 mg via INTRAVENOUS

## 2024-04-23 MED ORDER — GABAPENTIN 250 MG/5ML PO SOLN
100.0000 mg | Freq: Every day | ORAL | Status: DC
  Administered 2024-04-24 – 2024-04-28 (×4): 100 mg via ORAL
  Filled 2024-04-23 (×6): qty 2

## 2024-04-23 MED ORDER — SODIUM CHLORIDE 0.9% FLUSH
10.0000 mL | Freq: Two times a day (BID) | INTRAVENOUS | Status: DC
  Administered 2024-04-23 – 2024-04-24 (×3): 20 mL
  Administered 2024-04-25 – 2024-05-12 (×35): 10 mL
  Administered 2024-05-13: 30 mL

## 2024-04-23 MED ORDER — FENTANYL BOLUS VIA INFUSION
25.0000 ug | INTRAVENOUS | Status: DC | PRN
  Administered 2024-04-23 (×2): 50 ug via INTRAVENOUS
  Administered 2024-04-24: 75 ug via INTRAVENOUS
  Administered 2024-04-24: 50 ug via INTRAVENOUS

## 2024-04-23 MED ORDER — ORAL CARE MOUTH RINSE
15.0000 mL | OROMUCOSAL | Status: DC
  Administered 2024-04-23 – 2024-04-24 (×7): 15 mL via OROMUCOSAL

## 2024-04-23 MED ORDER — FENTANYL 2500MCG IN NS 250ML (10MCG/ML) PREMIX INFUSION
0.0000 ug/h | INTRAVENOUS | Status: DC
  Administered 2024-04-23: 25 ug/h via INTRAVENOUS
  Filled 2024-04-23: qty 250

## 2024-04-23 MED ORDER — SUCCINYLCHOLINE CHLORIDE 200 MG/10ML IV SOSY
PREFILLED_SYRINGE | INTRAVENOUS | Status: DC | PRN
  Administered 2024-04-23: 100 mg via INTRAVENOUS

## 2024-04-23 MED ORDER — ORAL CARE MOUTH RINSE: 15.0000 mL | OROMUCOSAL | Status: DC | PRN

## 2024-04-23 MED ORDER — PHENYLEPHRINE HCL-NACL 20-0.9 MG/250ML-% IV SOLN
INTRAVENOUS | Status: DC | PRN
  Administered 2024-04-23: 30 ug/min via INTRAVENOUS

## 2024-04-23 MED ORDER — PANTOPRAZOLE SODIUM 40 MG IV SOLR
40.0000 mg | Freq: Two times a day (BID) | INTRAVENOUS | Status: DC
  Administered 2024-04-23 – 2024-04-28 (×12): 40 mg via INTRAVENOUS
  Filled 2024-04-23 (×13): qty 10

## 2024-04-23 MED ORDER — VANCOMYCIN HCL 10 G IV SOLR
2500.0000 mg | Freq: Once | INTRAVENOUS | Status: AC
  Administered 2024-04-23: 2500 mg via INTRAVENOUS
  Filled 2024-04-23: qty 2500

## 2024-04-23 MED ORDER — SODIUM CHLORIDE 0.9 % IV SOLN
3.0000 g | Freq: Four times a day (QID) | INTRAVENOUS | Status: DC
  Administered 2024-04-23 – 2024-04-24 (×2): 3 g via INTRAVENOUS
  Filled 2024-04-23 (×3): qty 8

## 2024-04-23 MED ORDER — LACTATED RINGERS IV SOLN: INTRAVENOUS | Status: DC | PRN

## 2024-04-23 MED ORDER — PROPOFOL 500 MG/50ML IV EMUL
INTRAVENOUS | Status: DC | PRN
  Administered 2024-04-23: 150 ug/kg/min via INTRAVENOUS

## 2024-04-23 MED ORDER — LIDOCAINE 2% (20 MG/ML) 5 ML SYRINGE
INTRAMUSCULAR | Status: DC | PRN
  Administered 2024-04-23: 80 mg via INTRAVENOUS

## 2024-04-23 MED ORDER — FENTANYL CITRATE (PF) 50 MCG/ML IJ SOSY
25.0000 ug | PREFILLED_SYRINGE | Freq: Once | INTRAMUSCULAR | Status: AC
  Administered 2024-04-23: 50 ug via INTRAVENOUS
  Filled 2024-04-23: qty 1

## 2024-04-23 MED ORDER — OXYMETAZOLINE HCL 0.05 % NA SOLN
NASAL | Status: DC | PRN
  Administered 2024-04-23: 1

## 2024-04-23 MED ORDER — POLYETHYLENE GLYCOL 3350 17 G PO PACK
17.0000 g | PACK | Freq: Every day | ORAL | Status: DC
  Administered 2024-04-24 – 2024-04-25 (×2): 17 g
  Filled 2024-04-23 (×2): qty 1

## 2024-04-23 MED ORDER — OXYMETAZOLINE HCL 0.05 % NA SOLN
NASAL | Status: AC
  Filled 2024-04-23: qty 15

## 2024-04-23 MED ORDER — SODIUM CHLORIDE 0.9 % IV SOLN: INTRAVENOUS | Status: DC

## 2024-04-23 MED ORDER — SENNA 8.6 MG PO TABS
1.0000 | ORAL_TABLET | Freq: Two times a day (BID) | ORAL | Status: DC
  Administered 2024-04-24: 8.6 mg
  Filled 2024-04-23: qty 1

## 2024-04-23 MED ORDER — ORAL CARE MOUTH RINSE
15.0000 mL | OROMUCOSAL | Status: DC
  Administered 2024-04-23 (×3): 15 mL via OROMUCOSAL

## 2024-04-23 NOTE — Progress Notes (Signed)
 I spoke with the patient's son, [place], and explained to him what happened with his mother having the severe nosebleed requiring emergent intubation.  I explained and that we are going to wait to remove her breathing tube until we are more certain that her nosebleed has stopped.  I told him that she will be in the ICU monitored until we feel it is safe to remove her breathing tube. Devon indicated understanding and had no further questions.

## 2024-04-23 NOTE — Consult Note (Signed)
 "        [facility]    Date of Admission:  04/22/2024     Reason for Consult: osteomyelitis    Referring Provider: Gretta     Lines:  Peripheral iv's  Abx: 1/7-c linezolid         Assessment: 64 yo female HHT/avm, dm2, previous hardware right tib removed 07/15/23 with mssa infection s/p tx course, then complicated by a fracture a few months prior s/p open tx, with wound dehiscence and mri 03/16/24 concerning for osteomyelitis s/p ortho I&D of bone, here with epitasxis  Mri was at another facility previously Reviewed operative note Reviewed id outpatient note  Currently managed in icu for gib/epistaxis  03/16/24 I&D cx corynebacterium striatum, rare gpc in pairs that didn't grow  No abx given  1/7 given linezolid   1/9 admitted for epistaxis   Given this being c striatum which is ususally abx resistant but remain mostly vanc sensitive will do that as linezolid  long term might be too toxic   Plan: Wound care Start vanc and plan 6 weeks with opat as below Picc line ordered F/u dr Dennise in id clinic 05/06/24 Id will sign off Maintain standard isolation precaution Discussed with team     OPAT Orders Discharge antibiotics to be given via PICC line Discharge antibiotics: Vancomycin   Per pharmacy protocol Aim for Vancomycin  trough 15-20 or AUC 400-550 (unless otherwise indicated)  Duration: 6 weeks from 1/9  End Date: 05/04/24  [facility] Care Per Protocol:  Home health RN for IV administration and teaching; PICC line care and labs.    Labs weekly while on IV antibiotics: _x_ CBC with differential __ BMP _x_ CMP _x_ CRP __ ESR _x_ Vancomycin  trough __ CK  _x_ Please pull PIC at completion of IV antibiotics __ Please leave PIC in place until doctor has seen patient or been notified  Fax weekly labs to [phone]  Clinic Follow Up Appt: See above  [facility] [address], [place], [place] [address] Phone: [phone]    ------------------------------------------------ Principal Problem:   Symptomatic anemia Active Problems:   HLD (hyperlipidemia)   Essential hypertension   Insomnia   Iron  deficiency anemia   Diabetes mellitus (HCC)   HHT (hereditary hemorrhagic telangiectasia)   Depression with anxiety   Thrombocytopenia   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   PostOperative Diagnosis: Same   PROCEDURE: Right revision wound closure chronic surgical wound dehiscence, 15 cm Right fibular saucerization for osteomyelitis Right tibial saucerization for osteomyelitis Application of negative pressure wound dressing   Patient being followed by gi; egd planned In icu for bleed/epistaxis Hb 4.5 on presentation; dizzy/lightheaded  Intubated due to bleed/epistaxis  Family History  Problem Relation Age of Onset   Cancer Mother        Ovarian   Ovarian cancer Mother    Diabetes Mother    Kidney disease Mother    Bleeding Disorder Mother    Diabetes type II Sister  Bleeding Disorder Sister    Diabetes Sister    Colon cancer Maternal Grandfather    Crohn's disease Maternal Grandfather    Stomach cancer Maternal Grandmother    Kidney disease Son    Bleeding Disorder Son    Dysmenorrhea Neg Hx     Social History[1]  Allergies[2]  Review of Systems: ROS All Other ROS was negative, except mentioned above   Past Medical History:  Diagnosis Date    Anxiety    Arthritis    knnes,back   GERD (gastroesophageal reflux disease)    Heart murmur, systolic 07/27/2023   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   History of blood transfusion 03/08/2024   History of swelling of feet    hx - no current problems per pt on 03/10/24   Hyperlipidemia    Hypertension    Major depressive disorder, recurrent episode 06/05/2015   Obesity    Pneumonia    x 2   Snores    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   no meds, diet controlled, does not check blood sugar       Scheduled Meds:  amLODipine   10 mg Oral Daily   citalopram   20 mg Oral Daily   leptospermum manuka honey  1 Application Topical Daily   linezolid   600 mg Oral BID   lisinopril   20 mg Oral Daily   mouth rinse  15 mL Mouth Rinse Q2H   pantoprazole  (PROTONIX ) IV  40 mg Intravenous Q12H   polyethylene glycol  17 g Per Tube Daily   senna  1 tablet Per Tube BID   sodium chloride  flush  3 mL Intravenous Q12H   sodium chloride  flush  3 mL Intravenous Q12H   Continuous Infusions:  fentaNYL  infusion INTRAVENOUS 25 mcg/hr (04/23/24 1500)   propofol  (DIPRIVAN ) infusion 50 mcg/kg/min (04/23/24 1500)   PRN Meds:.acetaminophen  **OR** acetaminophen , ALPRAZolam , bisacodyl , fentaNYL , hydrALAZINE , HYDROmorphone  (DILAUDID ) injection, ipratropium, ondansetron  **OR** ondansetron  (ZOFRAN ) IV, mouth rinse, oxyCODONE , oxymetazoline , senna-docusate, sodium phosphate , traZODone    OBJECTIVE: Blood pressure (!) 105/56, pulse (!) 54, temperature (!) 97.1 F (36.2 C), temperature source Temporal, resp. rate 18, height 5' 4 (1.626 m), weight 103.4 kg, SpO2 96%.  Physical Exam  General/constitutional: intubated; sedated HEENT: Normocephalic Neck: supple CV: rrr no mrg Lungs: clear to auscultation, normal respiratory effort Abd: Soft, Nontender Ext: no edema Skin: No Rash Neuro: nonfocal MSK: rle wound sutures intact; no purulence; slight dehiscence  Lab  Results Lab Results  Component Value Date   WBC 9.2 04/23/2024   HGB 6.2 (LL) 04/23/2024   HCT 20.2 (L) 04/23/2024   MCV 87.8 04/23/2024   PLT 271 04/23/2024    Lab Results  Component Value Date   CREATININE 0.82 04/23/2024   BUN 10 04/23/2024   NA 142 04/23/2024   K 3.8 04/23/2024   CL 111 04/23/2024   CO2 21 (L) 04/23/2024    Lab Results  Component Value Date   ALT 5 04/22/2024   AST 11 (L) 04/22/2024   ALKPHOS 92 04/22/2024   BILITOT <0.2 04/22/2024      Microbiology: No results found for this or any previous visit (from the past 240 hours).   Serology:    Imaging: If present, new imagings (plain films, ct scans, and mri) have been personally visualized and interpreted; radiology reports have been reviewed. Decision making incorporated into the Impression / Recommendations.  1/8 cxr No active disease   Peggy ONEIDA Passer, MD [facility] [phone] pager    04/23/2024, 3:14 PM     [1]  Social History Tobacco Use   Smoking status: Every Day    Current packs/day: 0.50    Average packs/day: 0.5 packs/day for 30.0 years (15.0 ttl pk-yrs)    Types: Cigarettes   Smokeless tobacco: Former    Types: Snuff    Quit date: 1981   Tobacco comments:    1/2 PPD per patient on 03/10/24  Vaping Use   Vaping status: Never Used  Substance Use Topics   Alcohol use: No    Alcohol/week: 0.0 standard drinks of alcohol   Drug use: Not Currently    Comment: last cocaine-2010  [2]  Allergies Allergen Reactions   Feraheme  [Ferumoxytol ] Other (See Comments)    Muscle tetany, encephalopathy, fatigue, nausea (reaction to injection)   Nsaids Other (See Comments)    Stomach bleeding episodes; Tylenol  is OK   Wasp Venom Anaphylaxis   Tomato Hives   Iron  (Ferrous Sulfate ) [Ferrous Sulfate  Er] Other (See Comments)    Shock   "

## 2024-04-23 NOTE — Progress Notes (Signed)
 Patient has infected wound to right ankle. No orders for how to dress wound. ID called and CCM notified. Wound ostomy consult placed per both services for insight for how best to treat this wound. Until orders are placed, per CCM we cleansed wound with VASHE and did a VASHE moist to dry dressing and wrapped with kerlex.

## 2024-04-23 NOTE — Progress Notes (Signed)
 [facility] capsule endoscopy ordered by MD Dr Stacia.  Patient ingested capsule at  1400.  Per Given's capsule instructions, patient to remain NPO until 1600 at which time they may progress to clear liquid diet. At 1800 patient may have a small snack such as a half a sandwich or a bowl of soup. At 2200 patient may progress to previously ordered diet.  The capsule endoscopy study will conclude at 0200 at which time the recorder and leads or belt can be removed and placed in a patient belongings bag. Endoscopy staff will pick up the equipment in the AM.  Instructions provided to patient and inpatient RN. Patient and RN demonstrated understanding.

## 2024-04-23 NOTE — Plan of Care (Signed)

## 2024-04-23 NOTE — Interval H&P Note (Signed)
 History and Physical Interval Note:  04/23/2024 12:55 PM  Peggy House  has presented today for surgery, with the diagnosis of Gastrointestinal bleeding.  The various methods of treatment have been discussed with the patient and family. After consideration of risks, benefits and other options for treatment, the patient has consented to  Procedures with comments: EGD (ESOPHAGOGASTRODUODENOSCOPY) (N/A) - Plan is to drop capsule during EGD IMAGING PROCEDURE, GI TRACT, INTRALUMINAL, VIA CAPSULE (N/A) as a surgical intervention.  The patient's history has been reviewed, patient examined, no change in status, stable for surgery.  I have reviewed the patient's chart and labs.  Questions were answered to the patient's satisfaction.     Glendia FORBES Holt

## 2024-04-23 NOTE — Consult Note (Signed)
 ENT CONSULT:  Reason for Consult: Epistaxis  Referring Physician:  Leita Gaskins, DO  HPI: Peggy House is an 64 y.o. female with history of HHT admitted on 04/22/2024 with a chief complaint of generalized weakness/fatigue/dizziness, epistaxis and tarry stool.  Patient had EGD earlier today and subsequently experienced a severe nosebleed which required emergent intubation.  Secondary to the amount of blood loss and difficulty controlling the epistaxis, patient has remained intubated and sedated.  Patient seen and evaluated at bedside, no family present.  No active bleeding at time of examination.  Patient has nasal packs in place bilaterally.   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   no meds, diet controlled, does not check blood sugar    Past Surgical History:  Procedure Laterality Date   ABDOMINAL HYSTERECTOMY     APPLICATION OF WOUND VAC Right 07/28/2023   Procedure: APPLICATION, WOUND VAC;  Surgeon: Lowery Estefana RAMAN, DO;  [facility];  Service: Plastics;  Laterality: Right;   APPLICATION OF WOUND VAC Right 03/16/2024   Procedure: APPLICATION, WOUND VAC;  Surgeon: Elsa Lonni SAUNDERS, MD;  Location: [facility] OR;  Service: Orthopedics;  Laterality: Right;   APPLICATION, SKIN SUBSTITUTE Right 07/28/2023   Procedure: APPLICATION, SKIN SUBSTITUTE;  Surgeon: Lowery Estefana RAMAN, DO;  [facility];  Service: Plastics;  Laterality: Right;   CARPAL TUNNEL RELEASE  05/13/2011   Procedure:  CARPAL TUNNEL RELEASE;  Surgeon: Eva Elsie Herring, MD;  Location: [facility];  Service: Orthopedics;  Laterality: Left;   COLONOSCOPY N/A 03/02/2020   Procedure: COLONOSCOPY;  Surgeon: Donnald Charleston, MD;  [facility];  Service: Endoscopy;  Laterality: N/A;   COLONOSCOPY WITH PROPOFOL  N/A 04/28/2014   Procedure: COLONOSCOPY WITH PROPOFOL ;  Surgeon: Charleston Donnald GAILS, MD;  Location: [facility] ENDOSCOPY;  Service: Endoscopy;  Laterality: N/A;   DG TOES*L*  05/16/2008   rt   DILATION AND CURETTAGE OF UTERUS     ENTEROSCOPY N/A 10/17/2017   Procedure: ENTEROSCOPY;  Surgeon: Elicia Claw, MD;  [facility];  Service: Gastroenterology;  Laterality: N/A;   ESOPHAGOGASTRODUODENOSCOPY N/A 04/10/2014   Procedure: ESOPHAGOGASTRODUODENOSCOPY (EGD);  Surgeon: Jerrell KYM Sol, MD;  Location: [facility] ENDOSCOPY;  Service: Endoscopy;  Laterality: N/A;   ESOPHAGOGASTRODUODENOSCOPY N/A 05/10/2017   Procedure: ESOPHAGOGASTRODUODENOSCOPY (EGD);  Surgeon: Donnald Charleston, MD;  Location: [facility] ENDOSCOPY;  Service: Endoscopy;  Laterality: N/A;   ESOPHAGOGASTRODUODENOSCOPY N/A 09/22/2017   Procedure: ESOPHAGOGASTRODUODENOSCOPY (EGD);  Surgeon: Rosalie Kitchens, MD;  Location: [facility] ENDOSCOPY;  Service: Endoscopy;  Laterality: N/A;  bedside   ESOPHAGOGASTRODUODENOSCOPY N/A 03/02/2020   Procedure: ESOPHAGOGASTRODUODENOSCOPY (EGD);  Surgeon: Donnald Charleston, MD;  Location: [facility];  Service: Endoscopy;  Laterality: N/A;   ESOPHAGOGASTRODUODENOSCOPY N/A 11/03/2020   Procedure: ESOPHAGOGASTRODUODENOSCOPY (EGD);  Surgeon: Sol Jerrell, MD;  Location: [facility] ENDOSCOPY;  Service: Endoscopy;  Laterality: N/A;   ESOPHAGOGASTRODUODENOSCOPY N/A 04/12/2022   Procedure: ESOPHAGOGASTRODUODENOSCOPY (EGD);  Surgeon: Rosalie Kitchens, MD;  [facility];  Service: Gastroenterology;  Laterality: N/A;   ESOPHAGOGASTRODUODENOSCOPY N/A 05/27/2022   Procedure: ESOPHAGOGASTRODUODENOSCOPY (EGD);   Surgeon: Burnette Fallow, MD;  Location: [facility];  Service: Gastroenterology;  Laterality: N/A;   ESOPHAGOGASTRODUODENOSCOPY N/A 09/27/2022   Procedure: ESOPHAGOGASTRODUODENOSCOPY (EGD);  Surgeon: Kriss Estefana DEL, DO;  Location: [facility] ENDOSCOPY;  Service: Gastroenterology;  Laterality: N/A;   ESOPHAGOGASTRODUODENOSCOPY N/A 03/06/2024   Procedure: EGD (ESOPHAGOGASTRODUODENOSCOPY);  Surgeon: San Sandor GAILS, DO;  Location: [facility] ENDOSCOPY;  Service: Gastroenterology;  Laterality: N/A;   ESOPHAGOGASTRODUODENOSCOPY (EGD) WITH PROPOFOL  N/A 04/27/2014   Procedure: ESOPHAGOGASTRODUODENOSCOPY (EGD) WITH PROPOFOL ;  Surgeon: Lamar Donnald GAILS, MD;  Location: [facility] ENDOSCOPY;  Service: Endoscopy;  Laterality: N/A;  possible apc   ESOPHAGOGASTRODUODENOSCOPY (EGD) WITH PROPOFOL  N/A 09/30/2017   Procedure: ESOPHAGOGASTRODUODENOSCOPY (EGD) WITH PROPOFOL ;  Surgeon: Saintclair Jasper, MD;  Location: [facility] ENDOSCOPY;  Service: Gastroenterology;  Laterality: N/A;   ESOPHAGOGASTRODUODENOSCOPY (EGD) WITH PROPOFOL  N/A 10/01/2017   Procedure: ESOPHAGOGASTRODUODENOSCOPY (EGD) WITH PROPOFOL ;  Surgeon: Saintclair Jasper, MD;  Location: [facility] ENDOSCOPY;  Service: Gastroenterology;  Laterality: N/A;   ESOPHAGOGASTRODUODENOSCOPY (EGD) WITH PROPOFOL  N/A 10/08/2017   Procedure: ESOPHAGOGASTRODUODENOSCOPY (EGD) WITH PROPOFOL ;  Surgeon: Elicia Claw, MD;  [facility];  Service: Gastroenterology;  Laterality: N/A;   ESOPHAGOGASTRODUODENOSCOPY (EGD) WITH PROPOFOL  N/A 10/17/2017   Procedure: ESOPHAGOGASTRODUODENOSCOPY (EGD) WITH PROPOFOL ;  Surgeon: Elicia Claw, MD;  [facility];  Service: Gastroenterology;  Laterality: N/A;   ESOPHAGOGASTRODUODENOSCOPY (EGD) WITH PROPOFOL  N/A 10/19/2017   Procedure: ESOPHAGOGASTRODUODENOSCOPY (EGD) WITH PROPOFOL ;  Surgeon: Elicia Claw, MD;  [facility];  Service: Gastroenterology;  Laterality: N/A;   ESOPHAGOGASTRODUODENOSCOPY (EGD) WITH PROPOFOL  N/A 12/04/2018    Procedure: ESOPHAGOGASTRODUODENOSCOPY (EGD) WITH PROPOFOL ;  Surgeon: Dianna Specking, MD;  [facility];  Service: Endoscopy;  Laterality: N/A;   GIVENS CAPSULE STUDY N/A 10/02/2017   Procedure: GIVENS CAPSULE STUDY;  Surgeon: Saintclair Jasper, MD;  Location: [facility] ENDOSCOPY;  Service: Gastroenterology;  Laterality: N/A;   GIVENS CAPSULE STUDY N/A 10/08/2017   Procedure: GIVENS CAPSULE STUDY;  Surgeon: Elicia Claw, MD;  [facility];  Service: Gastroenterology;  Laterality: N/A;  endoscopic placement of capsule   GIVENS CAPSULE STUDY N/A 03/02/2020   Procedure: GIVENS CAPSULE STUDY;  Surgeon: Donnald Lamar, MD;  [facility];  Service: Endoscopy;  Laterality: N/A;   HARDWARE REMOVAL Right 07/15/2023   Procedure: REMOVAL, HARDWARE;  Surgeon: Sherida Adine BROCKS, MD;  [facility];  Service: Orthopedics;  Laterality: Right;   HEMOSTASIS CLIP PLACEMENT  12/04/2018   Procedure: HEMOSTASIS CLIP PLACEMENT;  Surgeon: Dianna Specking, MD;  [facility];  Service: Endoscopy;;   HEMOSTASIS CLIP PLACEMENT  05/27/2022   Procedure: HEMOSTASIS CLIP PLACEMENT;  Surgeon: Burnette Fallow, MD;  [facility];  Service: Gastroenterology;;   HEMOSTASIS CLIP PLACEMENT  09/27/2022   Procedure: HEMOSTASIS CLIP PLACEMENT;  Surgeon: Kriss Estefana DEL, DO;  [facility];  Service: Gastroenterology;;   HEMOSTASIS CONTROL  05/27/2022   Procedure: HEMOSTASIS CONTROL;  Surgeon: Burnette Fallow, MD;  [facility];  Service: Gastroenterology;;   HOT HEMOSTASIS N/A 04/27/2014   Procedure: HOT HEMOSTASIS (ARGON PLASMA COAGULATION/BICAP);  Surgeon: Lamar Donnald GAILS, MD;  Location: [facility] ENDOSCOPY;  Service: Endoscopy;  Laterality: N/A;   HOT HEMOSTASIS N/A 09/30/2017   Procedure: HOT HEMOSTASIS (ARGON PLASMA COAGULATION/BICAP);  Surgeon: Saintclair Jasper, MD;  Location: [facility] ENDOSCOPY;  Service: Gastroenterology;  Laterality: N/A;   HOT HEMOSTASIS N/A 10/01/2017    Procedure: HOT HEMOSTASIS (ARGON PLASMA COAGULATION/BICAP);  Surgeon: Saintclair Jasper, MD;  Location: [facility] ENDOSCOPY;  Service: Gastroenterology;  Laterality: N/A;   HOT HEMOSTASIS N/A 10/17/2017   Procedure: HOT HEMOSTASIS (ARGON PLASMA COAGULATION/BICAP);  Surgeon: Elicia Claw, MD;  Location: [facility] ENDOSCOPY;  Service: Gastroenterology;  Laterality: N/A;   HOT HEMOSTASIS N/A 10/19/2017   Procedure: HOT HEMOSTASIS (ARGON PLASMA COAGULATION/BICAP);  Surgeon: Elicia Claw, MD;  Location: [facility] ENDOSCOPY;  Service: Gastroenterology;  Laterality: N/A;   HOT HEMOSTASIS N/A 03/02/2020   Procedure: HOT HEMOSTASIS (ARGON PLASMA COAGULATION/BICAP);  Surgeon: Donnald Charleston, MD;  Location: [facility];  Service: Endoscopy;  Laterality: N/A;   HOT HEMOSTASIS N/A 04/12/2022   Procedure: HOT HEMOSTASIS (ARGON PLASMA COAGULATION/BICAP);  Surgeon: Rosalie Kitchens, MD;  Location: [facility];  Service: Gastroenterology;  Laterality: N/A;   INCISION AND DRAINAGE OF DEEP ABSCESS, ANKLE Right 03/16/2024   Procedure: INCISION AND DRAINAGE OF DEEP ABSCESS, ANKLE;  Surgeon: Elsa Lonni SAUNDERS, MD;  [facility];  Service: Orthopedics;  Laterality: Right;   INCISION AND DRAINAGE OF WOUND Right 07/21/2023   Procedure: IRRIGATION AND DEBRIDEMENT WOUND;  Surgeon: Sherida Adine BROCKS, MD;  [facility];  Service: Orthopedics;  Laterality: Right;  RIGHT ANKLE WOUND I&D   INCISION AND DRAINAGE OF WOUND Right 07/28/2023   Procedure: IRRIGATION AND DEBRIDEMENT WOUND;  Surgeon: Lowery Estefana RAMAN, DO;  [facility];  Service: Plastics;  Laterality: Right;  rrigation and debridement of right ankle wound with placement of myriad and wound vac   IR IMAGING GUIDED PORT INSERTION  07/08/2018   IRRIGATION AND DEBRIDEMENT POSTERIOR HIP Right 07/15/2023   Procedure: IRRIGATION AND DEBRIDEMENT, OPEN FRACTURE;  Surgeon: Sherida Adine BROCKS, MD;  [facility];  Service: Orthopedics;  Laterality: Right;   L shoulder Surgery   04/15/2009   ORIF ANKLE FRACTURE Right 04/24/2023   Procedure: OPEN REDUCTION INTERNAL FIXATION (ORIF) ANKLE FRACTURE;  Surgeon: Sherida Adine BROCKS, MD;  [facility];  Service: Orthopedics;  Laterality: Right;   POLYPECTOMY  03/02/2020   Procedure: POLYPECTOMY;  Surgeon: Donnald Charleston, MD;  [facility];  Service: Endoscopy;;   SCLEROTHERAPY  11/03/2020   Procedure: MATIAS;  Surgeon: Dianna Specking, MD;  Location: [facility] ENDOSCOPY;  Service: Endoscopy;;   SUBMUCOSAL INJECTION  09/22/2017   Procedure: SUBMUCOSAL INJECTION;  Surgeon: Rosalie Kitchens, MD;  Location: [facility] ENDOSCOPY;  Service: Endoscopy;;   SUBMUCOSAL INJECTION  12/04/2018   Procedure: SUBMUCOSAL INJECTION;  Surgeon: Dianna Specking, MD;  [facility];  Service: Endoscopy;;    Family History  Problem Relation Age of Onset   Cancer Mother        Ovarian   Ovarian cancer Mother    Diabetes Mother    Kidney disease Mother    Bleeding Disorder Mother    Diabetes type II Sister    Bleeding Disorder Sister    Diabetes Sister    Colon cancer Maternal Grandfather    Crohn's disease Maternal Grandfather    Stomach cancer Maternal Grandmother    Kidney disease Son    Bleeding Disorder Son    Dysmenorrhea Neg Hx     Social History:  reports that she has been smoking cigarettes. She has a 15 pack-year smoking history. She quit smokeless tobacco use about 45 years ago.  Her smokeless tobacco use included snuff. She reports that she does not currently use drugs. She reports that she does not drink alcohol.  Allergies: Allergies[1]  Medications: I have reviewed the patient's current medications.  Results for orders placed or performed during the hospital encounter of 04/22/24 (from the past 48 hours)  Type and screen  [facility]     Status: None (Preliminary result)   Collection Time: 04/22/24  1:25 PM  Result Value Ref Range   ABO/RH(D) A NEG    Antibody Screen NEG    Sample  Expiration      04/25/2024,2359 Performed at [facility] Lab, [address] [place], [place] [address]    Unit Number [id number]    Blood Component Type RED CELLS,LR    Unit division 00    Status of Unit ISSUED,FINAL    Transfusion Status OK TO TRANSFUSE    Crossmatch Result Compatible    Unit Number [id number]    Blood Component Type RED CELLS,LR    Unit division 00    Status of Unit ISSUED,FINAL    Transfusion Status OK TO TRANSFUSE    Crossmatch Result Compatible    Unit Number [id number]    Blood Component Type RED CELLS,LR    Unit division 00    Status of Unit ISSUED    Transfusion Status OK TO TRANSFUSE    Crossmatch Result Compatible    Unit Number [id number]    Blood Component Type RED CELLS,LR    Unit division 00    Status of Unit ISSUED    Transfusion Status OK TO TRANSFUSE    Crossmatch Result Compatible    Unit Number [id number]    Blood Component Type RED CELLS,LR    Unit division 00    Status of Unit ISSUED    Transfusion Status OK TO TRANSFUSE    Crossmatch Result Compatible    Unit Number [id number]    Blood Component Type RBC LR PHER2    Unit division 00    Status of Unit ALLOCATED    Transfusion Status OK TO TRANSFUSE    Crossmatch Result Compatible    Unit Number [id number]    Blood Component Type RBC LR PHER1    Unit division 00    Status of Unit ALLOCATED    Transfusion Status OK TO TRANSFUSE    Crossmatch Result Compatible   CBC with Differential     Status: Abnormal   Collection Time: 04/22/24  1:26 PM  Result Value Ref Range   WBC 10.6 (H) 4.0 - 10.5 K/uL   RBC 1.81 (L) 3.87 - 5.11 MIL/uL   Hemoglobin 4.5 (LL) 12.0 - 15.0 g/dL    Comment: REPEATED TO VERIFY This critical result has been called to R. TAMEA, RN by Mliss Conquest on 04/22/2024 14:17:15, and has been read back.    HCT 16.1 (L) 36.0 - 46.0 %   MCV 89.0 80.0 - 100.0 fL   MCH 24.9 (L) 26.0 - 34.0 pg   MCHC 28.0 (L) 30.0 - 36.0 g/dL   RDW 78.7 (H)  88.4 - 15.5 %   Platelets 371 150 - 400 K/uL   nRBC 0.3 (H) 0.0 - 0.2 %   Neutrophils Relative % 86 %   Neutro Abs 9.1 (H) 1.7 - 7.7 K/uL   Lymphocytes Relative 9 %   Lymphs Abs 1.0 0.7 - 4.0 K/uL   Monocytes Relative 3 %   Monocytes Absolute 0.3 0.1 - 1.0 K/uL   Eosinophils Relative 1 %   Eosinophils Absolute 0.1 0.0 - 0.5 K/uL   Basophils Relative 0 %   Basophils Absolute 0.0 0.0 - 0.1 K/uL   Immature Granulocytes 1 %   Abs Immature Granulocytes 0.05 0.00 - 0.07 K/uL    Comment: Performed at [facility] Lab, [address] [place],  [place] [address]  Comprehensive metabolic panel     Status: Abnormal   Collection Time: 04/22/24  1:26 PM  Result Value Ref Range   Sodium 143 135 - 145 mmol/L   Potassium 3.8 3.5 - 5.1 mmol/L   Chloride 110 98 - 111 mmol/L   CO2 24 22 - 32 mmol/L   Glucose, Bld 147 (H) 70 - 99 mg/dL    Comment: Glucose reference range applies only to samples taken after fasting for at least 8 hours.   BUN 9 8 - 23 mg/dL   Creatinine, Ser 9.18 0.44 - 1.00 mg/dL   Calcium  8.7 (L) 8.9 - 10.3 mg/dL   Total Protein 6.2 (L) 6.5 - 8.1 g/dL   Albumin 3.8 3.5 - 5.0 g/dL   AST 11 (L) 15 - 41 U/L   ALT 5 0 - 44 U/L   Alkaline Phosphatase 92 38 - 126 U/L   Total Bilirubin <0.2 0.0 - 1.2 mg/dL   GFR, Estimated >39 >39 mL/min    Comment: (NOTE) Calculated using the CKD-EPI Creatinine Equation (2021)    Anion gap 10 5 - 15    Comment: Performed at [facility] Lab, [address], [place], [place] [address]  POC occult blood, ED     Status: Abnormal   Collection Time: 04/22/24  2:06 PM  Result Value Ref Range   Fecal Occult Bld POSITIVE (A) NEGATIVE  Prepare RBC (crossmatch)     Status: None   Collection Time: 04/22/24  2:20 PM  Result Value Ref Range   Order Confirmation      ORDER PROCESSED BY BLOOD BANK Performed at [facility] Lab, [address] [place], [place] [address]   Magnesium      Status: None   Collection Time: 04/22/24  2:42 PM  Result  Value Ref Range   Magnesium  2.1 1.7 - 2.4 mg/dL    Comment: Performed at [facility] Lab, [address] [place], [place] [address]  Phosphorus     Status: None   Collection Time: 04/22/24  2:42 PM  Result Value Ref Range   Phosphorus 2.9 2.5 - 4.6 mg/dL    Comment: Performed at [facility] Lab, [address] [place] [address] [place], [place] [address]  Ferritin     Status: None   Collection Time: 04/22/24  2:42 PM  Result Value Ref Range   Ferritin 23 11 - 307 ng/mL    Comment: Performed at [facility] Lab, [address] [place], [place] [address]  Iron  and TIBC     Status: Abnormal   Collection Time: 04/22/24  2:42 PM  Result Value Ref Range   Iron  11 (L) 28 - 170 ug/dL   TIBC 711 749 - 549 ug/dL   Saturation Ratios 4 (L) 10.4 - 31.8 %   UIBC 278 ug/dL    Comment: Performed at [facility] Lab, [address] [place], [place] [address]  Protime-INR     Status: None   Collection Time: 04/22/24  9:06 PM  Result Value Ref Range   Prothrombin Time 15.0 11.4 - 15.2 seconds   INR 1.1 0.8 - 1.2    Comment: (NOTE) INR goal varies based on device and disease states. Performed at [facility] Lab, [address] [place], [place] [address]   Hemoglobin A1c     Status: Abnormal   Collection Time: 04/22/24  9:06 PM  Result Value Ref Range   Hgb A1c MFr Bld 4.6 (L) 4.8 -  5.6 %    Comment: (NOTE) Diagnosis of Diabetes The following HbA1c ranges recommended by the American Diabetes Association (ADA) may be used as an aid in the diagnosis of diabetes mellitus.  Hemoglobin             Suggested A1C NGSP%              Diagnosis  <5.7                   Non Diabetic  5.7-6.4                Pre-Diabetic  >6.4                   Diabetic  <7.0                   Glycemic control for                       adults with diabetes.     Mean Plasma Glucose 85.32 mg/dL    Comment: Performed at [facility] Lab, [address], [place], [place] [address]  Reticulocytes     Status:  Abnormal   Collection Time: 04/22/24  9:06 PM  Result Value Ref Range   Retic Ct Pct 2.5 0.4 - 3.1 %   RBC. 1.94 (L) 3.87 - 5.11 MIL/uL   Retic Count, Absolute 48.3 19.0 - 186.0 K/uL   Immature Retic Fract 10.1 2.3 - 15.9 %    Comment: Performed at [facility] Lab, [address] [place], [place] [address]  Vitamin B12     Status: None   Collection Time: 04/22/24  9:06 PM  Result Value Ref Range   Vitamin B-12 460 180 - 914 pg/mL    Comment: Performed at [facility] Lab, [address] [place], [place] [address]  Folate     Status: None   Collection Time: 04/22/24  9:06 PM  Result Value Ref Range   Folate 8.0 >5.9 ng/mL    Comment: Performed at [facility] Lab, [address], [place], [place] [address]  Hemoglobin and hematocrit, blood     Status: Abnormal   Collection Time: 04/22/24  9:06 PM  Result Value Ref Range   Hemoglobin 4.8 (LL) 12.0 - 15.0 g/dL    Comment: REPEATED TO VERIFY This critical result has been called to EMERSON CHO, RN by Jonette Catholic on 04/22/2024 22:06:30, and has been read back.    HCT 16.7 (L) 36.0 - 46.0 %    Comment: Performed at [facility] Lab, [address] [place], [place] [address]  Hemoglobin and hematocrit, blood     Status: Abnormal   Collection Time: 04/22/24 11:46 PM  Result Value Ref Range   Hemoglobin 5.9 (LL) 12.0 - 15.0 g/dL    Comment: REPEATED TO VERIFY This critical result has been called to E. ISAAC, RN by Delon Grippe on 04/23/2024 00:24:44, and has been read back.    HCT 19.7 (L) 36.0 - 46.0 %    Comment: Performed at [facility] Lab, [address], [place], [place] [address]  Basic metabolic panel     Status: Abnormal   Collection Time: 04/23/24  6:55 AM  Result Value Ref Range   Sodium 142 135 - 145 mmol/L   Potassium 3.8 3.5 - 5.1 mmol/L   Chloride 111 98 - 111 mmol/L   CO2 21 (L) 22 - 32 mmol/L  Glucose, Bld 105 (H) 70 - 99 mg/dL    Comment: Glucose reference range applies only to samples taken after  fasting for at least 8 hours.   BUN 10 8 - 23 mg/dL   Creatinine, Ser 9.17 0.44 - 1.00 mg/dL   Calcium  8.3 (L) 8.9 - 10.3 mg/dL   GFR, Estimated >39 >39 mL/min    Comment: (NOTE) Calculated using the CKD-EPI Creatinine Equation (2021)    Anion gap 9 5 - 15    Comment: Performed at [facility] Lab, [address], [place], [place] [address]  CBC     Status: Abnormal   Collection Time: 04/23/24  6:55 AM  Result Value Ref Range   WBC 9.2 4.0 - 10.5 K/uL   RBC 2.30 (L) 3.87 - 5.11 MIL/uL   Hemoglobin 6.2 (LL) 12.0 - 15.0 g/dL    Comment: REPEATED TO VERIFY This critical result has been called to CORNELIOUS NIAN RN by [place] Na on 04/23/2024 07:44:00, and has been read back.    HCT 20.2 (L) 36.0 - 46.0 %   MCV 87.8 80.0 - 100.0 fL   MCH 27.0 26.0 - 34.0 pg   MCHC 30.7 30.0 - 36.0 g/dL   RDW 81.5 (H) 88.4 - 84.4 %   Platelets 271 150 - 400 K/uL    Comment: REPEATED TO VERIFY   nRBC 0.3 (H) 0.0 - 0.2 %    Comment: Performed at [facility] Lab, [address] [place], [place] [address]  Protime-INR     Status: Abnormal   Collection Time: 04/23/24  6:55 AM  Result Value Ref Range   Prothrombin Time 15.7 (H) 11.4 - 15.2 seconds   INR 1.2 0.8 - 1.2    Comment: (NOTE) INR goal varies based on device and disease states. Performed at [facility] Lab, [address] [place], [place] [address]   APTT     Status: None   Collection Time: 04/23/24  6:55 AM  Result Value Ref Range   aPTT 33 24 - 36 seconds    Comment: Performed at [facility] Lab, [address], [place], [place] [address]  Prepare RBC (crossmatch)     Status: None   Collection Time: 04/23/24  7:48 AM  Result Value Ref Range   Order Confirmation      ORDER PROCESSED BY BLOOD BANK Performed at [facility] Lab, [address], [place], [place] [address]   Prepare RBC (crossmatch)     Status: None   Collection Time: 04/23/24  2:00 PM  Result Value Ref Range   Order Confirmation      ORDER  PROCESSED BY BLOOD BANK Performed at [facility] Lab, [address], [place], [place] [address]   CBC     Status: Abnormal   Collection Time: 04/23/24  3:06 PM  Result Value Ref Range   WBC 11.0 (H) 4.0 - 10.5 K/uL   RBC 3.02 (L) 3.87 - 5.11 MIL/uL   Hemoglobin 8.3 (L) 12.0 - 15.0 g/dL    Comment: REPEATED TO VERIFY   HCT 26.0 (L) 36.0 - 46.0 %   MCV 86.1 80.0 - 100.0 fL   MCH 27.5 26.0 - 34.0 pg   MCHC 31.9 30.0 - 36.0 g/dL   RDW 82.6 (H) 88.4 - 84.4 %   Platelets 293 150 - 400 K/uL    Comment: REPEATED TO VERIFY   nRBC 0.3 (H) 0.0 - 0.2 %    Comment:  Performed at [facility] Lab, [address] [place], [place] [address]  Fibrinogen  (coagulopathy lab panel)     Status: None   Collection Time: 04/23/24  3:06 PM  Result Value Ref Range   Fibrinogen  446 210 - 475 mg/dL    Comment: (NOTE) Fibrinogen  results may be underestimated in patients receiving thrombolytic therapy. Performed at [facility] Lab, [address], [place], [place] [address]   Protime-INR (coagulopathy lab panel)     Status: Abnormal   Collection Time: 04/23/24  3:06 PM  Result Value Ref Range   Prothrombin Time 16.2 (H) 11.4 - 15.2 seconds   INR 1.2 0.8 - 1.2    Comment: (NOTE) INR goal varies based on device and disease states. Performed at [facility] Lab, [address], [place], [place] [address]   APTT (coagulopathy lab panel)     Status: None   Collection Time: 04/23/24  3:06 PM  Result Value Ref Range   aPTT 32 24 - 36 seconds    Comment: Performed at [facility] Lab, [address], [place], [place] [address]  I-STAT 7, (LYTES, BLD GAS, ICA, H+H)     Status: Abnormal   Collection Time: 04/23/24  3:10 PM  Result Value Ref Range   pH, Arterial 7.250 (L) 7.35 - 7.45   pCO2 arterial 49.7 (H) 32 - 48 mmHg   pO2, Arterial 71 (L) 83 - 108 mmHg   Bicarbonate 21.8 20.0 - 28.0 mmol/L   TCO2 23 22 - 32 mmol/L   O2 Saturation 91 %   Acid-base deficit 5.0 (H) 0.0 - 2.0 mmol/L    Sodium 143 135 - 145 mmol/L   Potassium 3.9 3.5 - 5.1 mmol/L   Calcium , Ion 1.23 1.15 - 1.40 mmol/L   HCT 25.0 (L) 36.0 - 46.0 %   Hemoglobin 8.5 (L) 12.0 - 15.0 g/dL   Sample type ARTERIAL   Glucose, capillary     Status: None   Collection Time: 04/23/24  3:23 PM  Result Value Ref Range   Glucose-Capillary 98 70 - 99 mg/dL    Comment: Glucose reference range applies only to samples taken after fasting for at least 8 hours.  I-STAT 7, (LYTES, BLD GAS, ICA, H+H)     Status: Abnormal   Collection Time: 04/23/24  4:47 PM  Result Value Ref Range   pH, Arterial 7.302 (L) 7.35 - 7.45   pCO2 arterial 43.5 32 - 48 mmHg   pO2, Arterial 73 (L) 83 - 108 mmHg   Bicarbonate 21.6 20.0 - 28.0 mmol/L   TCO2 23 22 - 32 mmol/L   O2 Saturation 93 %   Acid-base deficit 5.0 (H) 0.0 - 2.0 mmol/L   Sodium 143 135 - 145 mmol/L   Potassium 3.9 3.5 - 5.1 mmol/L   Calcium , Ion 1.21 1.15 - 1.40 mmol/L   HCT 23.0 (L) 36.0 - 46.0 %   Hemoglobin 7.8 (L) 12.0 - 15.0 g/dL   Patient temperature 02.1 F    Collection site RADIAL, ALLEN'S TEST ACCEPTABLE    Drawn by RT    Sample type ARTERIAL   CBC     Status: Abnormal   Collection Time: 04/23/24  5:54 PM  Result Value Ref Range   WBC 11.7 (H) 4.0 - 10.5 K/uL   RBC 3.09 (L) 3.87 - 5.11 MIL/uL   Hemoglobin 8.5 (L) 12.0 - 15.0 g/dL   HCT 72.2 (L) 63.9 - 53.9 %   MCV 89.6 80.0 -  100.0 fL   MCH 27.5 26.0 - 34.0 pg   MCHC 30.7 30.0 - 36.0 g/dL   RDW 81.9 (H) 88.4 - 84.4 %   Platelets 265 150 - 400 K/uL   nRBC 0.3 (H) 0.0 - 0.2 %    Comment: Performed at [facility], [address], [place], [place] [address]   *Note: Due to a large number of results and/or encounters for the requested time period, some results have not been displayed. A complete set of results can be found in Results Review.    DG CHEST PORT 1 VIEW Result Date: 04/23/2024 CLINICAL DATA:  Endotracheal intubation EXAM: PORTABLE CHEST 1 VIEW COMPARISON:  April 22, 2024 FINDINGS: Stable  cardiomegaly. Endotracheal tube is noted distal tip 1.5 cm above the carina. Right internal jugular Port-A-Cath is unchanged. Right lung is clear. Mild left retrocardiac opacity is noted concerning for possible atelectasis or infiltrate and associated effusion. IMPRESSION: Endotracheal tube is noted with distal tip 1.5 cm above the carina. Mild left retrocardiac opacity is noted concerning for possible atelectasis or infiltrate and associated effusion. Electronically Signed   By: Lynwood Landy Raddle M.D.   On: 04/23/2024 15:32   US  EKG SITE RITE Result Date: 04/23/2024 If Site Rite image not attached, placement could not be confirmed due to current cardiac rhythm.  DG Chest 2 View Result Date: 04/22/2024 CLINICAL DATA:  Shortness of breath EXAM: CHEST - 2 VIEW COMPARISON:  March 04, 2024 FINDINGS: Stable cardiomegaly. Right internal jugular port is unchanged. Both lungs are clear. The visualized skeletal structures are unremarkable. IMPRESSION: No active cardiopulmonary disease. Electronically Signed   By: Lynwood Landy Raddle M.D.   On: 04/22/2024 14:08    ROS:ROS  Blood pressure 116/60, pulse (!) 41, temperature 97.8 F (36.6 C), temperature source Axillary, resp. rate (!) 24, height 5' 4 (1.626 m), weight 103.4 kg, SpO2 100%.  PHYSICAL EXAM: CONSTITUTIONAL: sedated on vent HENT: Head : normocephalic and atraumatic Nose: Nasal packing in place bilaterally, removed.  Copious clot suctioned from bilateral nasal cavities.  Nasal cavities irrigated and suctioned again, with no evidence of active bleeding.  Telangiectasias noted along septum and inferior turbinates bilaterally.  NasoPore placed in bilateral nasal cavities. Mouth/Throat:  ET tube in place, no evidence of active bleeding on limited exam of oral cavity   Assessment/Plan: Peggy House is a 64 y/o F with history of HHT with recent episode of severe epistaxis requiring intubation, occurring during upper endoscopy with  gastroenterology.  Patient is followed by medical oncology for HHT, and previously had been managed with IV bevacizumab .  This has recently been held due to nonhealing wound.  On exam, there is no evidence of active nasal bleeding.  NasoPore covered in bacitracin ointment was placed in bilateral nasal cavities.  Discussed with primary team that HHT is difficult to manage, and typically, non-dissolvable nasal packing is generally avoided as it is not a long-term solution and will cause trauma and additional bleeding.  We discussed possible options, to include operative control with Coblator and placement of Doyle nasal splints vs embolization with neuro IR.  However, given current clinical stability, we can hold off on urgent trip to the OR.  If patient continues to experience severe epistaxis overnight, we will plan on operative intervention tomorrow.  If bleeding occurs, recommend instilling Afrin in bilateral nasal cavities and maintaining direct, continuous pressure over the nares for at least 15 minutes with dose of IV TXA.  May be worth discussing  with heme/onc if patient can resume IV bevacizumab .  Patient would also benefit from outpatient follow-up once stable with rhinology in [facility] to see if she would benefit from septodermaplasty.   Thank you for allowing me to participate in the care of this patient. Please do not hesitate to contact me with any questions or concerns.   Gerard DELENA Shope, DO Otolaryngology [facility] Cell: [phone]   04/23/2024, 6:36 PM        [1]  Allergies Allergen Reactions   Feraheme  [Ferumoxytol ] Other (See Comments)    Muscle tetany, encephalopathy, fatigue, nausea (reaction to injection)   Nsaids Other (See Comments)    Stomach bleeding episodes; Tylenol  is OK   Wasp Venom Anaphylaxis   Tomato Hives   Iron  (Ferrous Sulfate ) [Ferrous Sulfate  Er] Other (See Comments)    Shock

## 2024-04-23 NOTE — Consult Note (Addendum)
 "  NAME:  Peggy House, MRN:  [id number], DOB:  [birth year], LOS: 1 ADMISSION DATE:  04/22/2024, CONSULTATION DATE:  04/24/2023 REFERRING MD:  Dr. Cindy, CHIEF COMPLAINT:  epistaxis   History of Present Illness:  Peggy House is a 64 y/o woman with a history of HHT, anemia requiring blood transfusions, who presented with Hb 4.1 after labs came back with low hemoglobin following visit with ID clinic. Peggy House complaints of DOE and fatigue over the past few days. Peggy House had dark tarry stools a few days prior to admission and several nosebleeds at home. Peggy House had epistaxis last night and again today during EGD. Peggy House had ~200cc blood suctioned out of orpharynx and gauze packed into her anterior nose during the EGD. Peggy House is not on chronic AC. Peggy House received 4 units of pRBC pre-EGD and another 1 units pRBC during the case. ENT was consulted and recommended using gauze with epi to stop the bleeding. Peggy House is transferring to the ICU post-EGD for airway protection until epistaxis is resolved.   Pertinent  Medical History  Per above  Significant Hospital Events: Including procedures, antibiotic start and stop dates in addition to other pertinent events   04/23/2023: Admitted for acute blood loss anemia and GIB 04/24/2023: epistaxis while in endoscopy, remained intubated for airway protection   Interim History / Subjective:  On arrival to ICU patient is intubated and sedated with nasal packing in place, does have in-line bloody secretions when suctioned. Hemodynamics stable   Objective    Blood pressure 133/64, pulse (!) 57, temperature (!) 97.1 F (36.2 C), temperature source Temporal, resp. rate 18, height 5' 4 (1.626 m), weight 103.4 kg, SpO2 94%.        Intake/Output Summary (Last 24 hours) at 04/23/2024 1411 Last data filed at 04/23/2024 1356 Gross per 24 hour  Intake 1286 ml  Output --  Net 1286 ml   Filed Weights   04/22/24 1255 04/23/24 0500  Weight: 101.2 kg 103.4 kg    Examination: General:  well nourished female, intubated and sedated  HENT: [place]/AT, nasal packing in place and left packing streaked with blood  Lungs: on mechanical ventilation, synchronous, coarse  Cardiovascular: RRR, normal S1 and S2 no m/r/g Abdomen: obese, soft, +BS Extremities: warm, dry, 1+ pitting edema to BLE Neuro: intubated and sedated GU: deferred  Resolved problem list   Assessment and Plan   Endotracheally intubated for airway protection in the setting of significant epistaxis  - Continue LTVV with goal DP<15 and Pplat<30 - PAD protocol with propofol  and fentanyl   - SAT/SBT when appropriate and wean to extubate as able following improvement of epistaxis  - Follow-up results of video endoscopy  - Appreciate ENT recommendations, will likely need cauterization given HHT  - Nasal packing at bedside  - Continue to monitor CBC and coags, will repeat now   GIB, likely due to angioectasias - on EGD multiple non-bleeding angioectasias treated with APC and clipping  Hereditary hemorrhagic telangiectasias Acute blood loss anemia on chronic anemia in the setting of above and epistaxis  - Appreciate GI recommendations, continue PPI 40 mg IV daily - Sucralfate  1 gm PO QID for 2 weeks when extubated  - Transfuse for Hgb<7 or hemodynamically significant bleeding - Correction of coagulopathy as indicated   History of HTN - Continue home lisinopril  and amlodipine  for now, may need to back off now that Peggy House is intubated and sedated  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 and I&D  - Follows outpatient with  infectious disease, recently resumed on linezolid  - will continue   History of anxiety/depression - Continue home citalopram , make home xanax  PRN for now   Labs   CBC: Recent Labs  Lab 04/16/24 1535 04/21/24 1532 04/22/24 1326 04/22/24 2106 04/22/24 2346 04/23/24 0655  WBC 14.9* 11.8* 10.6*  --   --  9.2  NEUTROABS 13.1* 10,018* 9.1*  --   --   --   HGB 5.3* 4.1* 4.5* 4.8* 5.9*  6.2*  HCT 18.0* 15.0* 16.1* 16.7* 19.7* 20.2*  MCV 85.7 86.7 89.0  --   --  87.8  PLT 303 354 371  --   --  271    Basic Metabolic Panel: Recent Labs  Lab 04/21/24 1532 04/22/24 1326 04/22/24 1442 04/23/24 0655  NA 141 143  --  142  K 3.9 3.8  --  3.8  CL 110 110  --  111  CO2 23 24  --  21*  GLUCOSE 89 147*  --  105*  BUN 9 9  --  10  CREATININE 0.86 0.81  --  0.82  CALCIUM  8.4* 8.7*  --  8.3*  MG  --   --  2.1  --   PHOS  --   --  2.9  --    GFR: Estimated Creatinine Clearance: 82.3 mL/min (by C-G formula based on SCr of 0.82 mg/dL). Recent Labs  Lab 04/16/24 1535 04/21/24 1532 04/22/24 1326 04/23/24 0655  WBC 14.9* 11.8* 10.6* 9.2    Liver Function Tests: Recent Labs  Lab 04/21/24 1532 04/22/24 1326  AST 9* 11*  ALT 6 5  ALKPHOS  --  92  BILITOT 0.2 <0.2  PROT 6.1 6.2*  ALBUMIN  --  3.8   No results for input(s): LIPASE, AMYLASE in the last 168 hours. No results for input(s): AMMONIA in the last 168 hours.  ABG    Component Value Date/Time   PHART 7.253 (L) 12/04/2018 1950   PCO2ART 55.4 (H) 12/04/2018 1950   PO2ART 184 (H) 12/04/2018 1950   HCO3 23.8 12/04/2018 1950   TCO2 24 09/25/2022 2203   ACIDBASEDEF 3.3 (H) 12/04/2018 1950   O2SAT 99.3 12/04/2018 1950     Coagulation Profile: Recent Labs  Lab 04/22/24 2106 04/23/24 0655  INR 1.1 1.2    Cardiac Enzymes: No results for input(s): CKTOTAL, CKMB, CKMBINDEX, TROPONINI in the last 168 hours.  HbA1C: Hgb A1c MFr Bld  Date/Time Value Ref Range Status  04/22/2024 09:06 PM 4.6 (L) 4.8 - 5.6 % Final    Comment:    (NOTE) Diagnosis of Diabetes The following HbA1c ranges recommended by the American Diabetes Association (ADA) may be used as an aid in the diagnosis of diabetes mellitus.  Hemoglobin             Suggested A1C NGSP%              Diagnosis  <5.7                   Non Diabetic  5.7-6.4                Pre-Diabetic  >6.4                   Diabetic  <7.0                    Glycemic control for  adults with diabetes.    02/01/2023 05:28 AM 5.0 4.8 - 5.6 % Final    Comment:    (NOTE) Pre diabetes:          5.7%-6.4%  Diabetes:              >6.4%  Glycemic control for   <7.0% adults with diabetes     CBG: No results for input(s): GLUCAP in the last 168 hours.  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   Surgical History:   Past Surgical History:  Procedure Laterality Date   ABDOMINAL HYSTERECTOMY     APPLICATION OF WOUND VAC Right 07/28/2023   Procedure: APPLICATION, WOUND VAC;  Surgeon: Lowery Estefana RAMAN, DO;  Location: [facility] OR;  Service: Plastics;  Laterality: Right;   APPLICATION OF WOUND VAC Right 03/16/2024   Procedure: APPLICATION, WOUND VAC;  Surgeon: Elsa Lonni SAUNDERS, MD;  [facility] OR;  Service: Orthopedics;  Laterality: Right;   APPLICATION, SKIN SUBSTITUTE Right 07/28/2023   Procedure: APPLICATION, SKIN SUBSTITUTE;  Surgeon: Lowery Estefana RAMAN, DO;  Location: [facility] OR;  Service: Plastics;  Laterality: Right;   CARPAL TUNNEL RELEASE  05/13/2011   Procedure: CARPAL TUNNEL RELEASE;  Surgeon: Eva Elsie Herring, MD;  Location: [facility];  Service: Orthopedics;  Laterality: Left;   COLONOSCOPY N/A 03/02/2020   Procedure: COLONOSCOPY;  Surgeon: Donnald Charleston, MD;  Location: [facility] ENDOSCOPY;  Service: Endoscopy;  Laterality: N/A;   COLONOSCOPY WITH PROPOFOL  N/A 04/28/2014   Procedure:  COLONOSCOPY WITH PROPOFOL ;  Surgeon: Charleston Donnald GAILS, MD;  Location: [facility] ENDOSCOPY;  Service: Endoscopy;  Laterality: N/A;   DG TOES*L*  05/16/2008   rt   DILATION AND CURETTAGE OF UTERUS     ENTEROSCOPY N/A 10/17/2017   Procedure: ENTEROSCOPY;  Surgeon: Elicia Claw, MD;  Location: [facility];  Service: Gastroenterology;  Laterality: N/A;   ESOPHAGOGASTRODUODENOSCOPY N/A 04/10/2014   Procedure: ESOPHAGOGASTRODUODENOSCOPY (EGD);  Surgeon: Jerrell KYM Sol, MD;  Location: [facility] ENDOSCOPY;  Service: Endoscopy;  Laterality: N/A;   ESOPHAGOGASTRODUODENOSCOPY N/A 05/10/2017   Procedure: ESOPHAGOGASTRODUODENOSCOPY (EGD);  Surgeon: Donnald Charleston, MD;  Location: [facility] ENDOSCOPY;  Service: Endoscopy;  Laterality: N/A;   ESOPHAGOGASTRODUODENOSCOPY N/A 09/22/2017   Procedure: ESOPHAGOGASTRODUODENOSCOPY (EGD);  Surgeon: Rosalie Kitchens, MD;  Location: [facility] ENDOSCOPY;  Service: Endoscopy;  Laterality: N/A;  bedside   ESOPHAGOGASTRODUODENOSCOPY N/A 03/02/2020   Procedure: ESOPHAGOGASTRODUODENOSCOPY (EGD);  Surgeon: Donnald Charleston, MD;  Location: [facility];  Service: Endoscopy;  Laterality: N/A;   ESOPHAGOGASTRODUODENOSCOPY N/A 11/03/2020   Procedure: ESOPHAGOGASTRODUODENOSCOPY (EGD);  Surgeon: Sol Jerrell, MD;  Location: [facility] ENDOSCOPY;  Service: Endoscopy;  Laterality: N/A;   ESOPHAGOGASTRODUODENOSCOPY N/A 04/12/2022   Procedure: ESOPHAGOGASTRODUODENOSCOPY (EGD);  Surgeon: Rosalie Kitchens, MD;  Location: [facility]COPY;  Service: Gastroenterology;  Laterality: N/A;   ESOPHAGOGASTRODUODENOSCOPY N/A 05/27/2022   Procedure: ESOPHAGOGASTRODUODENOSCOPY (EGD);  Surgeon: Burnette Elsie, MD;  Location: [facility];  Service: Gastroenterology;  Laterality: N/A;   ESOPHAGOGASTRODUODENOSCOPY N/A 09/27/2022   Procedure: ESOPHAGOGASTRODUODENOSCOPY (EGD);  Surgeon: Kriss Estefana DEL, DO;  Location: [facility] ENDOSCOPY;  Service: Gastroenterology;  Laterality: N/A;   ESOPHAGOGASTRODUODENOSCOPY N/A 03/06/2024   Procedure:  EGD (ESOPHAGOGASTRODUODENOSCOPY);  Surgeon: San,  Sandor GAILS, DO;  Location: [facility] ENDOS[facility];  Service: Gastroenterology;  Laterality: N/A;   ESOPHAGOGASTRODUODENOSCOPY (EGD) WITH PROPOFOL  N/A 04/27/2014   Procedure: ESOPHAGOGASTRODUODENOSCOPY (EGD) WITH PROPOFOL ;  Surgeon: Lamar Donnald GAILS, MD;  Location: [facility] ENDOSCOPY;  Service: Endoscopy;  Laterality: N/A;  possible apc   ESOPHAGOGASTRODUODENOSCOPY (EGD) WITH PROPOFOL  N/A 09/30/2017   Procedure: ESOPHAGOGASTRODUODENOSCOPY (EGD) WITH PROPOFOL ;  Surgeon: Saintclair Jasper, MD;  Location: [facility] ENDOSCOPY;  Service: Gastroenterology;  Laterality: N/A;   ESOPHAGOGASTRODUODENOSCOPY (EGD) WITH PROPOFOL  N/A 10/01/2017   Procedure: ESOPHAGOGASTRODUODENOSCOPY (EGD) WITH PROPOFOL ;  Surgeon: Saintclair Jasper, MD;  Location: [facility] ENDOSCOPY;  Service: Gastroenterology;  Laterality: N/A;   ESOPHAGOGASTRODUODENOSCOPY (EGD) WITH PROPOFOL  N/A 10/08/2017   Procedure: ESOPHAGOGASTRODUODENOSCOPY (EGD) WITH PROPOFOL ;  Surgeon: Elicia Claw, MD;  [facility]OS[facility];  Service: Gastroenterology;  Laterality: N/A;   ESOPHAGOGASTRODUODENOSCOPY (EGD) WITH PROPOFOL  N/A 10/17/2017   Procedure: ESOPHAGOGASTRODUODENOSCOPY (EGD) WITH PROPOFOL ;  Surgeon: Elicia Claw, MD;  [facility];  Service: Gastroenterology;  Laterality: N/A;   ESOPHAGOGASTRODUODENOSCOPY (EGD) WITH PROPOFOL  N/A 10/19/2017   Procedure: ESOPHAGOGASTRODUODENOSCOPY (EGD) WITH PROPOFOL ;  Surgeon: Elicia Claw, MD;  [facility];  Service: Gastroenterology;  Laterality: N/A;   ESOPHAGOGASTRODUODENOSCOPY (EGD) WITH PROPOFOL  N/A 12/04/2018   Procedure: ESOPHAGOGASTRODUODENOSCOPY (EGD) WITH PROPOFOL ;  Surgeon: Dianna Specking, MD;  Location: [facility] ENDOSCOPY;  Service: Endoscopy;  Laterality: N/A;   GIVENS CAPSULE STUDY N/A 10/02/2017   Procedure: GIVENS CAPSULE STUDY;  Surgeon: Saintclair Jasper, MD;  Location: [facility] ENDOSCOPY;  Service: Gastroenterology;  Laterality: N/A;   GIVENS CAPSULE STUDY N/A  10/08/2017   Procedure: GIVENS CAPSULE STUDY;  Surgeon: Elicia Claw, MD;  [facility];  Service: Gastroenterology;  Laterality: N/A;  endoscopic placement of capsule   GIVENS CAPSULE STUDY N/A 03/02/2020   Procedure: GIVENS CAPSULE STUDY;  Surgeon: Donnald Lamar, MD;  Location: [facility] ENDOSCOPY;  Service: Endoscopy;  Laterality: N/A;   HARDWARE REMOVAL Right 07/15/2023   Procedure: REMOVAL, HARDWARE;  Surgeon: Sherida Adine BROCKS, MD;  Location[facility] OR;  Service: Orthopedics;  Laterality: Right;   HEMOSTASIS CLIP PLACEMENT  12/04/2018   Procedure: HEMOSTASIS CLIP PLACEMENT;  Surgeon: Dianna Specking, MD;  Location: [facility] ENDOSCOPY;  Service: Endoscopy;;   HEMOSTASIS CLIP PLACEMENT  05/27/2022   Procedure: HEMOSTASIS CLIP PLACEMENT;  Surgeon: Burnette Fallow, MD;  Location: [facility] ENDOS[facility];  Service: Gastroenterology;;   HEMOSTASIS CLIP PLACEMENT  09/27/2022   Procedure: HEMOSTASIS CLIP PLACEMENT;  Surgeon: Kriss Estefana DEL, DO;  [facility];  Service: Gastroenterology;;   HEMOSTASIS CONTROL  05/27/2022   Procedure: HEMOSTASIS CONTROL;  Surgeon: Burnette Fallow, MD;  Location: [facility] ENDOS[facility];  Service: Gastroenterology;;   HOT HEMOSTASIS N/A 04/27/2014   Procedure: HOT HEMOSTASIS (ARGON PLASMA COAGULATION/BICAP);  Surgeon: Lamar Donnald GAILS, MD;  Location: [facility] ENDOSCOPY;  Service: Endoscopy;  Laterality: N/A;   HOT HEMOSTASIS N/A 09/30/2017   Procedure: HOT HEMOSTASIS (ARGON PLASMA COAGULATION/BICAP);  Surgeon: Saintclair Jasper, MD;  Location: [facility] ENDOSCOPY;  Service: Gastroenterology;  Laterality: N/A;   HOT HEMOSTASIS N/A 10/01/2017   Procedure: HOT HEMOSTASIS (ARGON PLASMA COAGULATION/BICAP);  Surgeon: Saintclair Jasper, MD;  Location: [facility] ENDOSCOPY;  Service: Gastroenterology;  Laterality: N/A;   HOT HEMOSTASIS N/A 10/17/2017   Procedure: HOT HEMOSTASIS (ARGON PLASMA COAGULATION/BICAP);  Surgeon: Elicia Claw, MD;  Location: [facility] ENDOSCOPY;  Service: Gastroenterology;  Laterality: N/A;    HOT HEMOSTASIS N/A 10/19/2017   Procedure: HOT HEMOSTASIS (ARGON PLASMA COAGULATION/BICAP);  Surgeon: Elicia Claw, MD;  Location: [facility] ENDOSCOPY;  Service: Gastroenterology;  Laterality: N/A;   HOT HEMOSTASIS N/A 03/02/2020   Procedure: HOT  HEMOSTASIS (ARGON PLASMA COAGULATION/BICAP);  Surgeon: Donnald Charleston, MD;  Location: [facility];  Service: Endoscopy;  Laterality: N/A;   HOT HEMOSTASIS N/A 04/12/2022   Procedure: HOT HEMOSTASIS (ARGON PLASMA COAGULATION/BICAP);  Surgeon: Rosalie Kitchens, MD;  Location: [facility];  Service: Gastroenterology;  Laterality: N/A;   INCISION AND DRAINAGE OF DEEP ABSCESS, ANKLE Right 03/16/2024   Procedure: INCISION AND DRAINAGE OF DEEP ABSCESS, ANKLE;  Surgeon: Elsa Lonni SAUNDERS, MD;  Location: [facility] OR;  Service: Orthopedics;  Laterality: Right;   INCISION AND DRAINAGE OF WOUND Right 07/21/2023   Procedure: IRRIGATION AND DEBRIDEMENT WOUND;  Surgeon: Sherida Adine BROCKS, MD;  Location: [facility];  Service: Orthopedics;  Laterality: Right;  RIGHT ANKLE WOUND I&D   INCISION AND DRAINAGE OF WOUND Right 07/28/2023   Procedure: IRRIGATION AND DEBRIDEMENT WOUND;  Surgeon: Lowery Estefana RAMAN, DO;  Location: [facility] OR;  Service: Plastics;  Laterality: Right;  rrigation and debridement of right ankle wound with placement of myriad and wound vac   IR IMAGING GUIDED PORT INSERTION  07/08/2018   IRRIGATION AND DEBRIDEMENT POSTERIOR HIP Right 07/15/2023   Procedure: IRRIGATION AND DEBRIDEMENT, OPEN FRACTURE;  Surgeon: Sherida Adine BROCKS, MD;  Location: [facility] OR;  Service: Orthopedics;  Laterality: Right;   L shoulder Surgery  04/15/2009   ORIF ANKLE FRACTURE Right 04/24/2023   Procedure: OPEN REDUCTION INTERNAL FIXATION (ORIF) ANKLE FRACTURE;  Surgeon: Sherida Adine BROCKS, MD;  Location: [facility] OR;  Service: Orthopedics;  Laterality: Right;   POLYPECTOMY  03/02/2020   Procedure: POLYPECTOMY;  Surgeon: Donnald Charleston, MD;  Location: [facility] ENDOSCOPY;  Service: Endoscopy;;   SCLEROTHERAPY   11/03/2020   Procedure: MATIAS;  Surgeon: Dianna Specking, MD;  Location: [facility] ENDOSCOPY;  Service: Endoscopy;;   SUBMUCOSAL INJECTION  09/22/2017   Procedure: SUBMUCOSAL INJECTION;  Surgeon: Rosalie Kitchens, MD;  Location: [facility] ENDOSCOPY;  Service: Endoscopy;;   SUBMUCOSAL INJECTION  12/04/2018   Procedure: SUBMUCOSAL INJECTION;  Surgeon: Dianna Specking, MD;  Location: [facility] ENDOSCOPY;  Service: Endoscopy;;     Social History:   reports that Peggy House has been smoking cigarettes. Peggy House has a 15 pack-year smoking history. Peggy House quit smokeless tobacco use about 45 years ago.  Her smokeless tobacco use included snuff. Peggy House reports that Peggy House does not currently use drugs. Peggy House reports that Peggy House does not drink alcohol.   Family History:  Her family history includes Bleeding Disorder in her mother, sister, and son; Cancer in her mother; Colon cancer in her maternal grandfather; Crohn's disease in her maternal grandfather; Diabetes in her mother and sister; Diabetes type II in her sister; Kidney disease in her mother and son; Ovarian cancer in her mother; Stomach cancer in her maternal grandmother. There is no history of Dysmenorrhea.   Allergies Allergies[1]   Home Medications  Prior to Admission medications  Medication Sig Start Date End Date Taking? Authorizing Provider  acetaminophen  (TYLENOL ) 325 MG tablet Take 2 tablets (650 mg total) by mouth every 6 (six) hours as needed. Patient taking differently: Take 650 mg by mouth every 6 (six) hours as needed for mild pain (pain score 1-3). 05/19/23  Yes Raulkar, Sven SQUIBB, MD  ALPRAZolam  (XANAX ) 0.5 MG tablet Take 0.5 mg by mouth at bedtime.   Yes [provider]  amLODipine  (NORVASC ) 10 MG tablet Take 1 tablet (10 mg total) by mouth daily. 06/10/23 06/09/24 Yes Gregary Sharper, MD  citalopram  (CELEXA ) 20 MG tablet Take 1 tablet (20 mg total) by mouth daily. 08/27/23  Yes Nooruddin, Saad, MD  gabapentin  (NEURONTIN ) 100 MG  capsule Take 1 capsule (100 mg  total) by mouth at bedtime. Patient taking differently: Take 100 mg by mouth daily as needed (acid reflux). 07/04/23  Yes Raulkar, Sven SQUIBB, MD  leptospermum manuka honey (MEDIHONEY) PSTE paste Apply 1 Application topically daily. 12/28/23  Yes Tobie Gaines, DO  linezolid  (ZYVOX ) 600 MG tablet Take 1 tablet (600 mg total) by mouth 2 (two) times daily for 14 days. 04/21/24 05/05/24 Yes Dennise Kingsley, MD  lisinopril  (ZESTRIL ) 20 MG tablet Take 1 tablet (20 mg total) by mouth daily. 06/23/23  Yes Nooruddin, Saad, MD  oxyCODONE  (ROXICODONE ) 5 MG immediate release tablet Take 1 tablet (5 mg total) by mouth every 4 (four) hours as needed (for post op pain). 03/16/24  Yes Jordan, Jesse J, PA-C  pantoprazole  (PROTONIX ) 40 MG tablet Take 1 tablet (40 mg total) by mouth 2 (two) times daily. TAKE 1 TABLET(40 MG) BY MOUTH TWICE DAILY 03/07/24 05/06/24 Yes Leotis Bogus, MD  traZODone  (DESYREL ) 50 MG tablet Take 1 tablet (50 mg total) by mouth at bedtime. Patient taking differently: Take 50 mg by mouth at bedtime as needed for sleep. 03/18/24  Yes Nooruddin, Saad, MD  lidocaine  4 % Place 1 patch onto the skin daily. Remove & Discard patch within 12 hours or as directed by MD Patient not taking: No sig reported 12/27/23   Tobie Gaines, DO     Critical care time: 64    The patient is critically ill with multiple organ system failure and requires high complexity decision making for assessment and support, frequent evaluation and titration of therapies, advanced monitoring, review of radiographic studies and interpretation of complex data.   Critical Care Time devoted to patient care services, exclusive of separately billable procedures, described in this note is 32 minutes.  Rexene LOISE Blush, PA-C 04/23/2024 2:19 PM [facility]  For contact information, see Amion. If no response to pager, please call PCCM 2H APP. After hours, 7PM- 7AM, please call on call APP for 2H.            [1]   Allergies Allergen Reactions   Feraheme  [Ferumoxytol ] Other (See Comments)    Muscle tetany, encephalopathy, fatigue, nausea (reaction to injection)   Nsaids Other (See Comments)    Stomach bleeding episodes; Tylenol  is OK   Wasp Venom Anaphylaxis   Tomato Hives   Iron  (Ferrous Sulfate ) [Ferrous Sulfate  Er] Other (See Comments)    Shock   "

## 2024-04-23 NOTE — Progress Notes (Signed)
 PHARMACY CONSULT NOTE FOR:  OUTPATIENT  PARENTERAL ANTIBIOTIC THERAPY (OPAT)  Indication: Osteomyelitis Regimen: Vancomycin  IV 1500 mg every 24 hours (adjusted as needed based on renal function and vancomycin  levels with goal AUC 400-600) End date: 2/202026  IV antibiotic discharge orders are pended. To discharging provider:  please sign these orders via discharge navigator,  Select New Orders & click on the button choice - Manage This Unsigned Work.    Thank you for allowing pharmacy to be a part of this patient's care.  Feliciano Close, PharmD PGY2 Infectious Diseases Pharmacy Resident

## 2024-04-23 NOTE — Progress Notes (Addendum)
" °   04/22/24 2118  Vitals  Temp 99.3 F (37.4 C)  Temp Source Oral  BP (!) 123/53  MAP (mmHg) 75  BP Location Right Arm  BP Method Automatic  Patient Position (if appropriate) Lying  Pulse Rate 69  Pulse Rate Source Monitor  ECG Heart Rate 67  Resp 18  Level of Consciousness  Level of Consciousness Alert  MEWS COLOR  MEWS Score Color Green  Oxygen Therapy  SpO2 99 %  O2 Device Room Air  Pulse Oximetry Type Continuous  MEWS Score  MEWS Temp 0  MEWS Systolic 0  MEWS Pulse 0  MEWS RR 0  MEWS LOC 0  MEWS Score 0   Pt admitted to [id number] from ED. Pt oriented to unit. CCMD called. Call light by pt.  2200 Pt blood transfusion initiated.15 minutes after initiation, pt denies symptoms of transfusion reactions. No signs and symptoms of reactions observed. Transfusion continues per protocol.  0030 Blood transfusion complete. Pt denies symptoms of transfusion reactions.Pt tolerated transfusion well. No signs and symptoms of reactions complete. Post-transfusion vitals obtained and documented  0209 Blood transfusion initiated. 15 post initiation, pt denies symptoms of transfusion reaction. No signs and symptoms of transfusion reaction observed. Transfusion continues per protocol.  0451 Blood transfusion complete. Pt denies symptoms of transfusion reactions. Pt tolerated well. Pt denies symptoms of transfusion reaction. Post-transfusion vitals obtained and documented.  "

## 2024-04-23 NOTE — Anesthesia Postprocedure Evaluation (Signed)
"   Anesthesia Post Note  Patient: Peggy House  Procedure(s) Performed: EGD (ESOPHAGOGASTRODUODENOSCOPY) IMAGING PROCEDURE, GI TRACT, INTRALUMINAL, VIA CAPSULE CONTROL OF HEMORRHAGE, GI TRACT, ENDOSCOPIC, BY CLIPPING OR OVERSEWING EGD, WITH ARGON PLASMA COAGULATION     Patient location during evaluation: SICU Anesthesia Type: General Level of consciousness: sedated Pain management: pain level controlled Vital Signs Assessment: post-procedure vital signs reviewed and stable Respiratory status: patient remains intubated per anesthesia plan Cardiovascular status: stable Postop Assessment: no apparent nausea or vomiting Anesthetic complications: no   There were no known notable events for this encounter.  Last Vitals:  Vitals:   04/23/24 1500 04/23/24 1524  BP: (!) 105/56   Pulse: (!) 54   Resp: 18   Temp:  36.6 C  SpO2: 96%     Last Pain:  Vitals:   04/23/24 1524  TempSrc: Axillary  PainSc:                  Peggy House      "

## 2024-04-23 NOTE — Anesthesia Preprocedure Evaluation (Addendum)
"                                    Anesthesia Evaluation  Patient identified by MRN, date of birth, ID band Patient awake    Reviewed: Allergy & Precautions, H&P , NPO status , Patient's Chart, lab work & pertinent test results  History of Anesthesia Complications Negative for: history of anesthetic complications  Airway Mallampati: II  TM Distance: >3 FB Neck ROM: Full    Dental  (+) Dental Advisory Given, Missing   Pulmonary Current Smoker and Patient abstained from smoking.   Pulmonary exam normal breath sounds clear to auscultation       Cardiovascular hypertension, Pt. on medications Normal cardiovascular exam Rhythm:Regular Rate:Normal  02/12/2023 IMPRESSIONS     1. Left ventricular ejection fraction, by estimation, is 60 to 65%. The  left ventricle has normal function. The left ventricle has no regional  wall motion abnormalities. Left ventricular diastolic parameters are  consistent with Grade II diastolic  dysfunction (pseudonormalization).   2. Right ventricular systolic function is normal. The right ventricular  size is normal.   3. Left atrial size was moderately dilated.   4. The mitral valve is normal in structure. No evidence of mitral valve  regurgitation. No evidence of mitral stenosis.   5. The aortic valve is normal in structure. Aortic valve regurgitation is  trivial. No aortic stenosis is present. Aortic valve mean gradient  measures 12.0 mmHg.   6. The inferior vena cava is normal in size with greater than 50%  respiratory variability, suggesting right atrial pressure of 3 mmHg.     Neuro/Psych  PSYCHIATRIC DISORDERS Anxiety Depression    negative neurological ROS     GI/Hepatic Neg liver ROS, PUD,GERD  ,,  Endo/Other  diabetes, Type 2    Renal/GU negative Renal ROS  negative genitourinary   Musculoskeletal  (+) Arthritis ,   History of infected right tibial process   Abdominal  (+) + obese  Peds negative pediatric  ROS (+)  Hematology  (+) Blood dyscrasia, anemia Hg 6.2, s/p 1pRBC and 1 currently hanging   Anesthesia Other Findings   Reproductive/Obstetrics negative OB ROS                              Anesthesia Physical Anesthesia Plan  ASA: 3  Anesthesia Plan: MAC   Post-op Pain Management:    Induction: Intravenous  PONV Risk Score and Plan: 1 and Treatment may vary due to age or medical condition  Airway Management Planned: Natural Airway and Simple Face Mask  Additional Equipment: None  Intra-op Plan:   Post-operative Plan:   Informed Consent: I have reviewed the patients History and Physical, chart, labs and discussed the procedure including the risks, benefits and alternatives for the proposed anesthesia with the patient or authorized representative who has indicated his/her understanding and acceptance.     Dental advisory given  Plan Discussed with: CRNA  Anesthesia Plan Comments:          Anesthesia Quick Evaluation  "

## 2024-04-23 NOTE — Plan of Care (Signed)
 Id brief update   Port present  If can be used for abx then no need for picc I have dc'ed picc order for now

## 2024-04-23 NOTE — Anesthesia Procedure Notes (Signed)
 Procedure Name: Intubation Date/Time: 04/23/2024 1:24 PM  Performed by: Scherrie Mast, CRNAPre-anesthesia Checklist: Patient identified, Emergency Drugs available, Suction available and Patient being monitored Patient Re-evaluated:Patient Re-evaluated prior to induction Oxygen Delivery Method: Circle System Utilized Preoxygenation: Pre-oxygenation with 100% oxygen Induction Type: IV induction Ventilation: Mask ventilation without difficulty Laryngoscope Size: Mac and 3 Grade View: Grade I Tube type: Oral Tube size: 7.0 mm Number of attempts: 1 Airway Equipment and Method: Stylet and Oral airway Placement Confirmation: ETT inserted through vocal cords under direct vision, positive ETCO2 and breath sounds checked- equal and bilateral Secured at: 21 cm Tube secured with: Tape Dental Injury: Teeth and Oropharynx as per pre-operative assessment  Comments: RSI d/t profuse epistaxis, BRB suctioned from posterior pharynx, no e/o aspiration

## 2024-04-23 NOTE — Transfer of Care (Signed)
 Immediate Anesthesia Transfer of Care Note  Patient: Peggy House  Procedure(s) Performed: EGD (ESOPHAGOGASTRODUODENOSCOPY) IMAGING PROCEDURE, GI TRACT, INTRALUMINAL, VIA CAPSULE CONTROL OF HEMORRHAGE, GI TRACT, ENDOSCOPIC, BY CLIPPING OR OVERSEWING EGD, WITH ARGON PLASMA COAGULATION  Patient Location: ICU  Anesthesia Type:General  Level of Consciousness: sedated and Patient remains intubated per anesthesia plan  Airway & Oxygen Therapy: Patient remains intubated per anesthesia plan and Patient placed on Ventilator (see vital sign flow sheet for setting)  Post-op Assessment: Report given to RN and Post -op Vital signs reviewed and stable  Post vital signs: Reviewed and stable  Last Vitals:  Vitals Value Taken Time  BP 141/69 04/23/24 14:33  Temp 36 1433  Pulse 64 04/23/24 14:37  Resp 21 04/23/24 14:37  SpO2 98 % 04/23/24 14:37  Vitals shown include unfiled device data.  Last Pain:  Vitals:   04/23/24 1235  TempSrc: Temporal  PainSc: 0-No pain         Complications: No notable events documented.

## 2024-04-23 NOTE — Progress Notes (Signed)
 Pt was taken to endoscopy suite earlier this AM before pt could be seen and re-evaluated. An additional 2 units of PRBC's were ordered for a post-transfusion hgb of 6.2. Was notified by PCCM that pt had nosebleed while in endo, thus remains intubated for airway protection, and will be cared for in ICU service for now until pt is able to return to floor.

## 2024-04-23 NOTE — Op Note (Signed)
 [facility] Patient Name: Peggy House Procedure Date : 04/23/2024 MRN: [id number] Attending MD: Glendia BRAVO. 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 Repeat EGD to treat any                            high risk AVMs and video capsule deployment to                            assess AVM burden in small intestine Providers:                Bomani Oommen E. Stacia, MD, Jacquelyn Jaci Pierce,                            RN, Janie Billups, Technician Referring MD:              Medicines:                Monitored Anesthesia Care, General Anesthesia Complications:            Severe epistaxis prompting emergent intubation Estimated Blood Loss:     Estimated 200-300 mL from epistaxis Procedure:                Pre-Anesthesia Assessment:                           - Prior to the procedure, a History and Physical                            was performed, and patient medications and                            allergies were reviewed. The patient's tolerance of                            previous anesthesia was also reviewed. The risks                            and benefits of the procedure and the sedation  options and risks were discussed with the patient.                            All questions were answered, and informed consent                            was obtained. Prior Anticoagulants: The patient has                            taken no anticoagulant or antiplatelet agents. ASA                            Grade  Assessment: III - A patient with severe                            systemic disease. After reviewing the risks and                            benefits, the patient was deemed in satisfactory                            condition to undergo the procedure.                           After obtaining informed consent, the endoscope was                            passed under direct vision. Throughout the                            procedure, the patient's blood pressure, pulse, and                            oxygen saturations were monitored continuously. The                            [id number]-H190 ([id number]) Olympus endoscope was introduced                            through the mouth, and advanced to the second part                            of duodenum. The upper GI endoscopy was                            accomplished without difficulty. The patient                            experienced severe epistaxis immediately upon                            starting the procedure. The bleeding was profuse  and the decision was made to emergently intubate                            the patient to protect her airway. The endoscope                            was withdrawn and intubation was successful and her                            nares were pack with oxymetazoline -soaked gauze.                            The endoscopy was then resumed and completed. Scope In: Scope Out: Findings:      The examined esophagus was normal.      Multiple (~12-15) 2 to 7 mm angioectasias with no bleeding were found in       the gastric body and cardia. There was scarring and deformities       consistent with recent treatment of AVMs. A single hemoclip was noted       with a healing APC site. Coagulation for tissue destruction using argon       plasma was successful to treat the 5 five largest and highest risk       lesions. To prevent bleeding post-intervention, six hemostatic clips       were  successfully placed (MR conditional) between multiple treated site.       Clip manufacturer: [employer]. There was no bleeding at the end       of the procedure.      The exam of the stomach was otherwise normal.      Diffuse nodular mucosa was found in the duodenal bulb and in the second       portion of the duodenum.      A single 3 mm angioectasia without bleeding was found in the second       portion of the duodenum.      The exam of the duodenum was otherwise normal.      Using the endoscope, the video capsule enteroscope was advanced into the       second portion of the duodenum. Impression:               - Normal esophagus.                           - Multiple (12-15) non-bleeding angioectasias in                            the stomach. Five were treated with argon plasma                            coagulation (APC). Clips (MR conditional) were                            placed. Clip manufacturer: [employer].                           - Nodular mucosa in the duodenal bulb and in the  second portion of the duodenum.                           - A single non-bleeding angioectasia in the                            duodenum.                           - Successful completion of the Video Capsule                            Enteroscope placement.                           - No specimens collected.                           - Severe epistaxis requiring emergent intubation.                            Due to severity of her bleeding, and concern for                            airway protection upon extubation, the decision was                            made to keep patient intubated until more                            definitive hemostasis could be achieved. Moderate Sedation:      N/A Recommendation:           - Transfer patient to ICU for ongoing care.                           - NPO today, clear liquid diet after extubation.                            - ENT consult for management of severe epistaxis in                            HHT patient.                           - Use Protonix  (pantoprazole ) 40 mg IV daily.                           - Use sucralfate  suspension 1 gram PO QID for 2                            weeks (can wait until extubated).                           - Video capsule endosocpy results will be  downloaded and reviewed tomorrow. Procedure Code(s):        --- Professional ---                           [phone], Esophagogastroduodenoscopy, flexible,                            transoral; with ablation of tumor(s), polyp(s), or                            other lesion(s) (includes pre- and post-dilation                            and guide wire passage, when performed) Diagnosis Code(s):        --- Professional ---                           X68.180, Angiodysplasia of stomach and duodenum                            without bleeding                           K31.89, Other diseases of stomach and duodenum                           R58, Hemorrhage, not elsewhere classified CPT copyright 2022 American Medical Association. All rights reserved. The codes documented in this report are preliminary and upon coder review may  be revised to meet current compliance requirements. Francessca Friis E. Stacia, MD 04/23/2024 2:33:40 PM This report has been signed electronically. Number of Addenda: 0

## 2024-04-23 NOTE — Consult Note (Addendum)
 WOC Nurse Consult Note: patient with I&D R ankle abscess with known R tibia/fibula osteomyelitis; is followed by orthopedics last seen 12/19 and ordered WTD; also followed by infectious disease  Reason for Consult: R ankle wound  Wound type: full thickness R ankle/lower leg  post I&D infectious  Pressure Injury POA: NA  Measurement: see nursing flowsheet  Wound bed:60% red 40% tan fibrinous sutures noted  Drainage (amount, consistency, odor) see nursing flowsheet  Periwound: Dressing procedure/placement/frequency: Cleanse R lower leg/ankle wound with Vashe, do not rinse. Apply Vashe moistened gauze to wound bed daily, cover with dry gauze and silicone foam or Kerlix roll gauze whichever is preferred.   Patient should resume follow-up with orthopedics and infectious disease at discharge for ongoing management of this wound.   Thank you,    Powell Bar MSN, RN-BC, [employer]

## 2024-04-23 NOTE — Progress Notes (Addendum)
 Pharmacy Antibiotic Note  Peggy House is a 64 y.o. female admitted on 04/22/2024 with a chief complaint of generalized weakness/fatigue/dizziness, nosebleed, and episode of black stool.Pharmacy has been consulted for vancomycin  dosing. Infectious workup is for osteomyelitis.   Plan: Vancomycin  2500 mg IV x1 loading dose, followed by empiric dose of vancomycin  1500 mg every 24 hours (eAUC 530.1, Cmax 39.7, Cmin 11.6, Scr 0.82, Vd coefficient 0.5) Monitor renal function, clinical progression, and vancomycin  levels as needed Adjust future doses as needed based on renal function and vancomycin  levels   Height: 5' 4 (162.6 cm) Weight: 103.4 kg (227 lb 15.3 oz) IBW/kg (Calculated) : 54.7  Temp (24hrs), Avg:98.5 F (36.9 C), Min:97.1 F (36.2 C), Max:99.3 F (37.4 C)  Recent Labs  Lab 04/21/24 1532 04/22/24 1326 04/23/24 0655 04/23/24 1506  WBC 11.8* 10.6* 9.2 11.0*  CREATININE 0.86 0.81 0.82  --     Estimated Creatinine Clearance: 82.3 mL/min (by C-G formula based on SCr of 0.82 mg/dL).    Allergies[1]  Antimicrobials this admission: Linezolid  1/8>>1/9 Vancomycin  1/9 >> [06/04/2024 per ID]  Microbiology results: 03/16/24 surgical culture from right leg wound = Corynebacterium striatum  Thank you for allowing pharmacy to be a part of this patients care.  Feliciano Close, PharmD PGY2 Infectious Diseases Pharmacy Resident      [1]  Allergies Allergen Reactions   Feraheme  [Ferumoxytol ] Other (See Comments)    Muscle tetany, encephalopathy, fatigue, nausea (reaction to injection)   Nsaids Other (See Comments)    Stomach bleeding episodes; Tylenol  is OK   Wasp Venom Anaphylaxis   Tomato Hives   Iron  (Ferrous Sulfate ) [Ferrous Sulfate  Er] Other (See Comments)    Shock

## 2024-04-24 ENCOUNTER — Inpatient Hospital Stay (HOSPITAL_COMMUNITY)

## 2024-04-24 DIAGNOSIS — K3189 Other diseases of stomach and duodenum: Secondary | ICD-10-CM | POA: Diagnosis not present

## 2024-04-24 DIAGNOSIS — K639 Disease of intestine, unspecified: Secondary | ICD-10-CM

## 2024-04-24 DIAGNOSIS — R040 Epistaxis: Secondary | ICD-10-CM | POA: Diagnosis not present

## 2024-04-24 DIAGNOSIS — K31819 Angiodysplasia of stomach and duodenum without bleeding: Secondary | ICD-10-CM | POA: Diagnosis not present

## 2024-04-24 DIAGNOSIS — D649 Anemia, unspecified: Secondary | ICD-10-CM | POA: Diagnosis not present

## 2024-04-24 DIAGNOSIS — D500 Iron deficiency anemia secondary to blood loss (chronic): Secondary | ICD-10-CM | POA: Diagnosis not present

## 2024-04-24 LAB — CBC
HCT: 25.1 % — ABNORMAL LOW (ref 36.0–46.0)
Hemoglobin: 7.8 g/dL — ABNORMAL LOW (ref 12.0–15.0)
MCH: 27.6 pg (ref 26.0–34.0)
MCHC: 31.1 g/dL (ref 30.0–36.0)
MCV: 88.7 fL (ref 80.0–100.0)
Platelets: 273 K/uL (ref 150–400)
RBC: 2.83 MIL/uL — ABNORMAL LOW (ref 3.87–5.11)
RDW: 18.4 % — ABNORMAL HIGH (ref 11.5–15.5)
WBC: 14.4 K/uL — ABNORMAL HIGH (ref 4.0–10.5)
nRBC: 0 % (ref 0.0–0.2)

## 2024-04-24 LAB — HEMOGLOBIN AND HEMATOCRIT, BLOOD
HCT: 25.7 % — ABNORMAL LOW (ref 36.0–46.0)
HCT: 25.9 % — ABNORMAL LOW (ref 36.0–46.0)
HCT: 24.7 % — ABNORMAL LOW (ref 36.0–46.0)
Hemoglobin: 7.9 g/dL — ABNORMAL LOW (ref 12.0–15.0)
Hemoglobin: 7.8 g/dL — ABNORMAL LOW (ref 12.0–15.0)
Hemoglobin: 7.6 g/dL — ABNORMAL LOW (ref 12.0–15.0)

## 2024-04-24 LAB — BASIC METABOLIC PANEL WITH GFR
Anion gap: 8 (ref 5–15)
BUN: 8 mg/dL (ref 8–23)
CO2: 23 mmol/L (ref 22–32)
Calcium: 7.9 mg/dL — ABNORMAL LOW (ref 8.9–10.3)
Chloride: 110 mmol/L (ref 98–111)
Creatinine, Ser: 0.69 mg/dL (ref 0.44–1.00)
GFR, Estimated: >60 mL/min
Glucose, Bld: 139 mg/dL — ABNORMAL HIGH (ref 70–99)
Potassium: 3.5 mmol/L (ref 3.5–5.1)
Sodium: 140 mmol/L (ref 135–145)

## 2024-04-24 LAB — GLUCOSE, CAPILLARY
Glucose-Capillary: 116 mg/dL — ABNORMAL HIGH (ref 70–99)
Glucose-Capillary: 122 mg/dL — ABNORMAL HIGH (ref 70–99)
Glucose-Capillary: 89 mg/dL (ref 70–99)
Glucose-Capillary: 130 mg/dL — ABNORMAL HIGH (ref 70–99)

## 2024-04-24 LAB — TRIGLYCERIDES: Triglycerides: 116 mg/dL

## 2024-04-24 MED ORDER — POTASSIUM CHLORIDE 10 MEQ/50ML IV SOLN
10.0000 meq | INTRAVENOUS | Status: AC
  Administered 2024-04-24 (×4): 10 meq via INTRAVENOUS
  Filled 2024-04-24 (×4): qty 50

## 2024-04-24 MED ORDER — ORAL CARE MOUTH RINSE: 15.0000 mL | OROMUCOSAL | Status: DC | PRN

## 2024-04-24 MED ORDER — SUCRALFATE 1 GM/10ML PO SUSP
1.0000 g | Freq: Three times a day (TID) | ORAL | Status: DC
  Administered 2024-04-24 – 2024-05-13 (×74): 1 g via ORAL
  Filled 2024-04-24 (×83): qty 10

## 2024-04-24 MED ORDER — SENNA 8.6 MG PO TABS
1.0000 | ORAL_TABLET | Freq: Two times a day (BID) | ORAL | Status: DC
  Administered 2024-04-25 – 2024-05-13 (×27): 8.6 mg via ORAL
  Filled 2024-04-24 (×33): qty 1

## 2024-04-24 MED ORDER — AMOXICILLIN 500 MG PO CAPS
500.0000 mg | ORAL_CAPSULE | Freq: Two times a day (BID) | ORAL | Status: DC
  Administered 2024-04-24 – 2024-04-25 (×3): 500 mg via ORAL
  Filled 2024-04-24 (×3): qty 1

## 2024-04-24 NOTE — Progress Notes (Signed)
 "  NAME:  Peggy House, MRN:  [id number], DOB:  01/24/1961, LOS: 2 ADMISSION DATE:  04/22/2024, CONSULTATION DATE:  04/24/2023 REFERRING MD:  Dr. Cindy, CHIEF COMPLAINT:  epistaxis   History of Present Illness:  Peggy House is a 64 y/o woman with a history of HHT, anemia requiring blood transfusions, who presented with Hb 4.1 after labs came back with low hemoglobin following visit with ID clinic. She complaints of DOE and fatigue over the past few days. She had dark tarry stools a few days prior to admission and several nosebleeds at home. She had epistaxis last night and again today during EGD. She had ~200cc blood suctioned out of orpharynx and gauze packed into her anterior nose during the EGD. She is not on chronic AC. She received 4 units of pRBC pre-EGD and another 1 units pRBC during the case. ENT was consulted and recommended using gauze with epi to stop the bleeding. She is transferring to the ICU post-EGD for airway protection until epistaxis is resolved.   Pertinent  Medical History  Per above  Significant Hospital Events: Including procedures, antibiotic start and stop dates in addition to other pertinent events   04/23/2023: Admitted for acute blood loss anemia and GIB 04/24/2023: epistaxis while in endoscopy, remained intubated for airway protection   Interim History / Subjective:  No significant bleeding overnight, HR better with isoproterenol .   Objective    Blood pressure 136/61, pulse 85, temperature 97.7 F (36.5 C), temperature source Axillary, resp. rate (!) 29, height 5' 4 (1.626 m), weight 109.5 kg, SpO2 96%. CVP:  [13 mmHg-28 mmHg] 16 mmHg  Vent Mode: PSV;CPAP FiO2 (%):  [40 %-50 %] 40 % Set Rate:  [18 bmp-24 bmp] 24 bmp Vt Set:  [430 mL] 430 mL PEEP:  [5 cmH20] 5 cmH20 Pressure Support:  [8 cmH20] 8 cmH20 Plateau Pressure:  [19 cmH20-23 cmH20] 20 cmH20   Intake/Output Summary (Last 24 hours) at 04/24/2024 1043 Last data filed at 04/24/2024 0800 Gross  per 24 hour  Intake 2753.03 ml  Output 410 ml  Net 2343.03 ml   Filed Weights   04/22/24 1255 04/23/24 0500 04/24/24 0500  Weight: 101.2 kg 103.4 kg 109.5 kg    Examination: General: critically ill appearing woman lying in bed intubated, sedated HENT: /AT, eyes anicteric. Nasal packing-- not soaked. Lungs: breathing comfortably on 8/5, CTAB Cardiovascular: S1S2, RRR Abdomen: obese, soft, NT Extremities: no edema, no cyanosis Neuro: RASS -1, follows commands x 4  BUN 8 Cr 0.69 WBC 14.4 H/H 7.8/25.1 Platelets 273   Resolved problem list   Assessment and Plan   Endotracheally intubated for airway protection in the setting of significant epistaxis; aspirated blood during rapid onset epistaxis while under anesthesia - LTVV -VAP prevention protocol -PAD protocol for sedation- fentanyl  -SAT & SBT-- passed, extubated to mask with nasal packing in place  Hereditary hemorrhagic telangiectasias-- off VEG-F inhibitor due to healing issues after her ankle surgery GIB, likely due to angioectasias - on EGD multiple non-bleeding angioectasias treated with APC and clipping -- EGD did not explain the degree of anemia she had at presentation Epistaxis is acute on chronic Acute blood loss anemia on chronic anemia in the setting of above and epistaxis  - PPI BID -advance diet slowly - Sucralfate  1 gm PO QID for 2 weeks when extubated  -transfuse for Hb <7 or hemodynamically significant bleeding -getting capsule endoscopy with GI  Epistaxis due to HHT -leave nasal packing in this morning -needs B-lactam antibiotics  while nasal packing is in use for TTS prophylaxis  -has topical amicar  PTA for epistaxis -ENT recommending follow up as OP with ENT at [facility] with rhinology for possible surgical intervention -Discussed case with on call Hematology-- concern for wound healing vs continued bleeding. Needs to have a discussion about these options and let her decide.  History of HTN -hold PTA  amlodipine  and lisinopril   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 and I&D  -linezolid  changed to vanc -appreciate ID's management   History of anxiety/depression -con't PTA lexaprol, con't PTA xanax  PRN  Labs   CBC: Recent Labs  Lab 04/21/24 1532 04/22/24 1326 04/22/24 2106 04/23/24 0655 04/23/24 1506 04/23/24 1510 04/23/24 1647 04/23/24 1754 04/23/24 2322 04/24/24 0506  WBC 11.8* 10.6*  --  9.2 11.0*  --   --  11.7*  --  14.4*  NEUTROABS 10,018* 9.1*  --   --   --   --   --   --   --   --   HGB 4.1* 4.5*   < > 6.2* 8.3* 8.5* 7.8* 8.5* 8.1* 7.8*  HCT 15.0* 16.1*   < > 20.2* 26.0* 25.0* 23.0* 27.7* 25.8* 25.1*  MCV 86.7 89.0  --  87.8 86.1  --   --  89.6  --  88.7  PLT 354 371  --  271 293  --   --  265  --  273   < > = values in this interval not displayed.    Basic Metabolic Panel: Recent Labs  Lab 04/21/24 1532 04/22/24 1326 04/22/24 1442 04/23/24 0655 04/23/24 1510 04/23/24 1647 04/23/24 1754 04/24/24 0506  NA 141 143  --  142 143 143 140 140  K 3.9 3.8  --  3.8 3.9 3.9 4.4 3.5  CL 110 110  --  111  --   --  113* 110  CO2 23 24  --  21*  --   --  16* 23  GLUCOSE 89 147*  --  105*  --   --  92 139*  BUN 9 9  --  10  --   --  8 8  CREATININE 0.86 0.81  --  0.82  --   --  0.83 0.69  CALCIUM  8.4* 8.7*  --  8.3*  --   --  8.2* 7.9*  MG  --   --  2.1  --   --   --   --   --   PHOS  --   --  2.9  --   --   --   --   --    GFR: Estimated Creatinine Clearance: 87 mL/min (by C-G formula based on SCr of 0.69 mg/dL). Recent Labs  Lab 04/23/24 0655 04/23/24 1506 04/23/24 1754 04/24/24 0506  WBC 9.2 11.0* 11.7* 14.4*    Critical care time:    This patient is critically ill with multiple organ system failure which requires frequent high complexity decision making, assessment, support, evaluation, and titration of therapies. This was completed through the application of advanced monitoring technologies and extensive interpretation of multiple  databases. During this encounter critical care time was devoted to patient care services described in this note for 50 minutes.    Peggy SHAUNNA Gaskins, DO 04/24/2024 11:02 AM [facility]  For contact information, see Amion. If no response to pager, please call PCCM 2H APP. After hours, 7PM- 7AM, please call on call APP for 2H.           "

## 2024-04-24 NOTE — Progress Notes (Signed)
 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 She required multiple units of blood. Currently her Hb is over 7 and she is hemodynamically stable. Dr Gretta asked me if we could give her a dose of avastin  if she starts bleeding again Discussed with Devan our on call pharmacist, they will be on a standby to give the drug if need be in case pt starts bleeding again Also agree with supportive transfusion to maintain Hb of 7 Agree with outpt ENT follow up for further recommendations. I discussed that avastin  will delay wound healing but she if has a massive bleed and she needs avastin , she understands benefits of avastin  outweigh the risks.  Subjective:  Pt sitting in chair with bilateral nostrils packed. On oxygen via face mask. Feeling tired. No bleeding at this time Right foot in dressing,  Objective:  Vitals:   04/24/24 1128 04/24/24 1145  BP: (!) 116/57 (!) 118/54  Pulse: 65 70  Resp: 16 (!) 23  Temp:    SpO2: 96% 95%    Body mass index is 41.44 kg/m.  Intake/Output Summary (Last 24 hours) at 04/24/2024 1221 Last data filed at 04/24/2024 1100 Gross per 24 hour  Intake 2723.98 ml  Output 410 ml  Net 2313.98 ml     She appears alert, no distress             Both noses packed, face mask              No acute bleeding              CBG (last 3)  Recent Labs    04/24/24 0621 04/24/24 0719 04/24/24 1155  GLUCAP 116* 122* 89     Labs:  Lab Results  Component Value Date   WBC 14.4 (H) 04/24/2024   HGB 7.8 (L) 04/24/2024   HCT 25.1 (L) 04/24/2024   MCV 88.7 04/24/2024   PLT 273 04/24/2024   NEUTROABS 9.1 (H) 04/22/2024   Urine Studies No results for input(s): UHGB, CRYS in the last 72 hours.  Invalid input(s): UACOL, UAPR, USPG, UPH, UTP, UGL, UKET,  UBIL, UNIT, UROB, [place], UEPI, UWBC, CORINN NUMBERS [place], [place], [place]  Basic Metabolic Panel: Recent Labs  Lab 04/21/24 1532 04/22/24 1326 04/22/24 1442 04/23/24 0655 04/23/24 1510 04/23/24 1647 04/23/24 1754 04/24/24 0506  NA 141 143  --  142 143 143 140 140  K 3.9 3.8  --  3.8 3.9 3.9 4.4 3.5  CL 110 110  --  111  --   --  113* 110  CO2 23 24  --  21*  --   --  16* 23  GLUCOSE 89 147*  --  105*  --   --  92 139*  BUN 9 9  --  10  --   --  8 8  CREATININE 0.86 0.81  --  0.82  --   --  0.83 0.69  CALCIUM  8.4* 8.7*  --  8.3*  --   --  8.2* 7.9*  MG  --   --  2.1  --   --   --   --   --   PHOS  --   --  2.9  --   --   --   --   --    GFR Estimated Creatinine Clearance: 87 mL/min (  by C-G formula based on SCr of 0.69 mg/dL). Liver Function Tests: Recent Labs  Lab 04/21/24 1532 04/22/24 1326  AST 9* 11*  ALT 6 5  ALKPHOS  --  92  BILITOT 0.2 <0.2  PROT 6.1 6.2*  ALBUMIN  --  3.8   No results for input(s): LIPASE, AMYLASE in the last 168 hours. No results for input(s): AMMONIA in the last 168 hours. Coagulation profile Recent Labs  Lab 04/22/24 2106 04/23/24 0655 04/23/24 1506 04/23/24 1754  INR 1.1 1.2 1.2 1.2    CBC: Recent Labs  Lab 04/21/24 1532 04/22/24 1326 04/22/24 2106 04/23/24 0655 04/23/24 1506 04/23/24 1510 04/23/24 1647 04/23/24 1754 04/23/24 2322 04/24/24 0506  WBC 11.8* 10.6*  --  9.2 11.0*  --   --  11.7*  --  14.4*  NEUTROABS 10,018* 9.1*  --   --   --   --   --   --   --   --   HGB 4.1* 4.5*   < > 6.2* 8.3* 8.5* 7.8* 8.5* 8.1* 7.8*  HCT 15.0* 16.1*   < > 20.2* 26.0* 25.0* 23.0* 27.7* 25.8* 25.1*  MCV 86.7 89.0  --  87.8 86.1  --   --  89.6  --  88.7  PLT 354 371  --  271 293  --   --  265  --  273   < > = values in this interval not displayed.   Cardiac Enzymes: No results for input(s): CKTOTAL, CKMB, CKMBINDEX, TROPONINI in the last 168 hours. BNP: Invalid input(s): POCBNP CBG: Recent Labs  Lab  04/23/24 1523 04/24/24 0621 04/24/24 0719 04/24/24 1155  GLUCAP 98 116* 122* 89   D-Dimer No results for input(s): DDIMER in the last 72 hours. Hgb A1c Recent Labs    04/22/24 2106  HGBA1C 4.6*   Lipid Profile Recent Labs    04/24/24 0506  TRIG 116   Thyroid function studies No results for input(s): TSH, T4TOTAL, T3FREE, THYROIDAB in the last 72 hours.  Invalid input(s): FREET3 Anemia work up Recent Labs    04/22/24 1442 04/22/24 2106  VITAMINB12  --  460  FOLATE  --  8.0  FERRITIN 23  --   TIBC 288  --   IRON  11*  --   RETICCTPCT  --  2.5   Microbiology No results found for this or any previous visit (from the past 240 hours).    Studies:  DG CHEST PORT 1 VIEW Result Date: 04/23/2024 CLINICAL DATA:  Endotracheal intubation EXAM: PORTABLE CHEST 1 VIEW COMPARISON:  April 22, 2024 FINDINGS: Stable cardiomegaly. Endotracheal tube is noted distal tip 1.5 cm above the carina. Right internal jugular Port-A-Cath is unchanged. Right lung is clear. Mild left retrocardiac opacity is noted concerning for possible atelectasis or infiltrate and associated effusion. IMPRESSION: Endotracheal tube is noted with distal tip 1.5 cm above the carina. Mild left retrocardiac opacity is noted concerning for possible atelectasis or infiltrate and associated effusion. Electronically Signed   By: Lynwood Landy Raddle M.D.   On: 04/23/2024 15:32   US  EKG SITE RITE Result Date: 04/23/2024 If Site Rite image not attached, placement could not be confirmed due to current cardiac rhythm.  DG Chest 2 View Result Date: 04/22/2024 CLINICAL DATA:  Shortness of breath EXAM: CHEST - 2 VIEW COMPARISON:  March 04, 2024 FINDINGS: Stable cardiomegaly. Right internal jugular port is unchanged. Both lungs are clear. The visualized skeletal structures are unremarkable. IMPRESSION: No active cardiopulmonary disease. Electronically Signed  By: Lynwood Landy Raddle M.D.   On: 04/22/2024 14:08    Amber Stalls, MD 04/24/2024  12:21 PM

## 2024-04-24 NOTE — Procedures (Signed)
 Extubation Procedure Note  Patient Details:   Name: Peggy House DOB: 1960/12/16 MRN: [id number]   Airway Documentation:    Vent end date: 04/24/24 Vent end time: 1045   Evaluation  O2 sats: stable throughout Complications: No apparent complications Patient did tolerate procedure well. Bilateral Breath Sounds: Diminished   Yes  Pt extubated per order to 3L venturi mask due to nose bleeds. Pt had a positive cuff leak, good strong cough, able to state name and no stridor to be noted.   Shan LITTIE Collum 04/24/2024, 11:04 AM

## 2024-04-24 NOTE — Progress Notes (Signed)
 " [facility] ROUNDING NOTE   Subjective: Patient's video capsule was reviewed today.  She had several small nonbleeding AVMs in the small intestine, but the overall burden was quite low compared to her stomach. An incidental submucosal nodule was noted deep in the small intestine.     Objective: Vital signs in last 24 hours: Temp:  [97.5 F (36.4 C)-98.5 F (36.9 C)] 97.6 F (36.4 C) (01/10 1530) Pulse Rate:  [43-96] 68 (01/10 1900) Resp:  [10-30] 19 (01/10 1900) BP: (97-162)/(47-105) 118/70 (01/10 1900) SpO2:  [90 %-100 %] 93 % (01/10 1900) FiO2 (%):  [24 %-40 %] 24 % (01/10 1200) Weight:  [109.5 kg] 109.5 kg (01/10 0500) Last BM Date : 04/22/24 General: NAD, pleasant African-American female sitting in bedside chair Lungs:  CTA b/l, no w/r/r Heart:  RRR, no m/r/g Abdomen:  Soft, NT, ND, +BS Ext:  No c/c/e    Intake/Output from previous day: 01/09 0701 - 01/10 0700 In: 2688.5 [I.V.:826.1; Blood:1037.3; IV Piggyback:825] Out: 410 [Urine:410] Intake/Output this shift: No intake/output data recorded.   Lab Results: Recent Labs    04/23/24 1506 04/23/24 1510 04/23/24 1754 04/23/24 2322 04/24/24 0506 04/24/24 1233 04/24/24 1758  WBC 11.0*  --  11.7*  --  14.4*  --   --   HGB 8.3*   < > 8.5*   < > 7.8* 7.9* 7.8*  PLT 293  --  265  --  273  --   --   MCV 86.1  --  89.6  --  88.7  --   --    < > = values in this interval not displayed.   BMET Recent Labs    04/23/24 0655 04/23/24 1510 04/23/24 1647 04/23/24 1754 04/24/24 0506  NA 142   < > 143 140 140  K 3.8   < > 3.9 4.4 3.5  CL 111  --   --  113* 110  CO2 21*  --   --  16* 23  GLUCOSE 105*  --   --  92 139*  BUN 10  --   --  8 8  CREATININE 0.82  --   --  0.83 0.69  CALCIUM  8.3*  --   --  8.2* 7.9*   < > = values in this interval not displayed.   LFT Recent Labs    04/22/24 1326  PROT 6.2*  ALBUMIN 3.8  AST 11*  ALT 5  ALKPHOS 92  BILITOT <0.2   PT/INR Recent Labs     04/23/24 1506 04/23/24 1754  INR 1.2 1.2      Imaging/Other results: DG CHEST PORT 1 VIEW Result Date: 04/23/2024 CLINICAL DATA:  Endotracheal intubation EXAM: PORTABLE CHEST 1 VIEW COMPARISON:  April 22, 2024 FINDINGS: Stable cardiomegaly. Endotracheal tube is noted distal tip 1.5 cm above the carina. Right internal jugular Port-A-Cath is unchanged. Right lung is clear. Mild left retrocardiac opacity is noted concerning for possible atelectasis or infiltrate and associated effusion. IMPRESSION: Endotracheal tube is noted with distal tip 1.5 cm above the carina. Mild left retrocardiac opacity is noted concerning for possible atelectasis or infiltrate and associated effusion. Electronically Signed   By: Lynwood Landy Raddle M.D.   On: 04/23/2024 15:32   US  EKG SITE RITE Result Date: 04/23/2024 If Site Rite image not attached, placement could not be confirmed due to current cardiac rhythm.     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  She underwent EGD which was complicated by severe epistaxis at the onset of the procedure, requiring emergent intubation, nasal packing and transferred to the ICU until epistaxis hemostasis could be definitively determined.  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 Video capsule endoscopy with several small nonbleeding AVMs and an incidental submucosal nodule  HHT with acute on chronic iron  deficiency anemia secondary to gastrointestinal AVMs - Maintain on clear liquid diet today given difficult hemostasis on some of the AVMs treated with APC - Advance to regular diet tomorrow if stable hemoglobin - Continue twice daily PPI IV while inpatient.  Would discharge on twice daily oral PPI for 8 weeks, followed by once daily indefinitely - Start Carafate  suspension 1 g 4 times daily while inpatient.  Would discharge on Carafate  tablets  1 g 4 times daily for 2 weeks - Given the patient's extensive AVM burden, as well as the risk of complications such as the patient's epistaxis, the benefits of further endoscopic therapy for her bleeding AVMs may be outweighed by the risks. - Patient's iron  deficiency anemia may be best managed medically with Avastin  and iron  and transfusions as needed.  Incidental submucosal nodule on VCE -  Will plan to get an outpatient CT enterography to further evaluate.  GI will sign off at this time.  Will arrange outpatient CTE   Glendia FORBES Holt, MD  04/24/2024, 7:54 PM [facility]  "

## 2024-04-25 ENCOUNTER — Encounter (HOSPITAL_COMMUNITY): Payer: Self-pay | Admitting: Gastroenterology

## 2024-04-25 ENCOUNTER — Inpatient Hospital Stay (HOSPITAL_COMMUNITY)

## 2024-04-25 DIAGNOSIS — D649 Anemia, unspecified: Secondary | ICD-10-CM | POA: Diagnosis not present

## 2024-04-25 LAB — CBC
HCT: 24.1 % — ABNORMAL LOW (ref 36.0–46.0)
Hemoglobin: 7.4 g/dL — ABNORMAL LOW (ref 12.0–15.0)
MCH: 27.2 pg (ref 26.0–34.0)
MCHC: 30.7 g/dL (ref 30.0–36.0)
MCV: 88.6 fL (ref 80.0–100.0)
Platelets: 251 K/uL (ref 150–400)
RBC: 2.72 MIL/uL — ABNORMAL LOW (ref 3.87–5.11)
RDW: 18.5 % — ABNORMAL HIGH (ref 11.5–15.5)
WBC: 11.3 K/uL — ABNORMAL HIGH (ref 4.0–10.5)
nRBC: 0.2 % (ref 0.0–0.2)

## 2024-04-25 LAB — ECHOCARDIOGRAM COMPLETE
AR max vel: 1.31 cm2
AV Area VTI: 1.45 cm2
AV Area mean vel: 1.49 cm2
AV Mean grad: 19 mmHg
AV Peak grad: 39.4 mmHg
Ao pk vel: 3.14 m/s
Area-P 1/2: 2.76 cm2
Calc EF: 69.5 %
Height: 64 in
S' Lateral: 3 cm
Single Plane A2C EF: 66.4 %
Single Plane A4C EF: 73.3 %
Weight: 3816.6 [oz_av]

## 2024-04-25 LAB — BASIC METABOLIC PANEL WITH GFR
Anion gap: 7 (ref 5–15)
BUN: <5 mg/dL — ABNORMAL LOW (ref 8–23)
CO2: 23 mmol/L (ref 22–32)
Calcium: 7.9 mg/dL — ABNORMAL LOW (ref 8.9–10.3)
Chloride: 109 mmol/L (ref 98–111)
Creatinine, Ser: 0.69 mg/dL (ref 0.44–1.00)
GFR, Estimated: >60 mL/min
Glucose, Bld: 90 mg/dL (ref 70–99)
Potassium: 3.5 mmol/L (ref 3.5–5.1)
Sodium: 139 mmol/L (ref 135–145)

## 2024-04-25 LAB — GLUCOSE, CAPILLARY
Glucose-Capillary: 90 mg/dL (ref 70–99)
Glucose-Capillary: 123 mg/dL — ABNORMAL HIGH (ref 70–99)

## 2024-04-25 MED ORDER — POLYETHYLENE GLYCOL 3350 17 G PO PACK
17.0000 g | PACK | Freq: Every day | ORAL | Status: DC
  Administered 2024-04-26 – 2024-05-13 (×12): 17 g via ORAL
  Filled 2024-04-25 (×15): qty 1

## 2024-04-25 MED ORDER — POTASSIUM CHLORIDE CRYS ER 20 MEQ PO TBCR
40.0000 meq | EXTENDED_RELEASE_TABLET | Freq: Once | ORAL | Status: AC
  Administered 2024-04-25: 40 meq via ORAL
  Filled 2024-04-25: qty 2

## 2024-04-25 NOTE — Progress Notes (Signed)
 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 Yesterday we discussed with her about trying Avastin  if she has uncontrolled bleeding. She is comfortable today, Hb maintaining No more episodes of epistaxis. No hematochezia or melena. We discussed about continuing monitoring I already discussed with our nursing team and pharmacy about avastin  in case she needs it. We will continue to monitor her at this time.  Subjective:   Pt sitting in chair , no ongoing epistaxis She is awake She is wondering if she can get some medication for leg pain. No other concerns today  Objective:  Vitals:   04/25/24 1000 04/25/24 1100  BP: 136/63 133/65  Pulse: 61 62  Resp: 17 17  Temp:    SpO2: 94% 93%    Body mass index is 40.94 kg/m.  Intake/Output Summary (Last 24 hours) at 04/25/2024 1144 Last data filed at 04/25/2024 1038 Gross per 24 hour  Intake 958.44 ml  Output 1300 ml  Net -341.56 ml     She appears alert, no distress             No bleeding              CBG (last 3)  Recent Labs    04/24/24 1155 04/24/24 1550 04/25/24 0606  GLUCAP 89 130* 90     Labs:  Lab Results  Component Value Date   WBC 11.3 (H) 04/25/2024   HGB 7.4 (L) 04/25/2024   HCT 24.1 (L) 04/25/2024   MCV 88.6 04/25/2024   PLT 251 04/25/2024   NEUTROABS 9.1 (H) 04/22/2024   Urine Studies No results for input(s): UHGB, CRYS in the last 72 hours.  Invalid input(s): UACOL, UAPR, USPG, UPH, UTP, UGL, [place], UBIL, UNIT, UROB, [place], UEPI, UWBC, CORINN NUMBERS [place], [place], [place]  Basic Metabolic Panel: Recent Labs  Lab 04/22/24 1326 04/22/24 1442 04/23/24 0655 04/23/24 1510 04/23/24 1647 04/23/24 1754 04/24/24 0506 04/25/24 0424  NA 143  --  142 143 143 140 140 139  K 3.8   --  3.8 3.9 3.9 4.4 3.5 3.5  CL 110  --  111  --   --  113* 110 109  CO2 24  --  21*  --   --  16* 23 23  GLUCOSE 147*  --  105*  --   --  92 139* 90  BUN 9  --  10  --   --  8 8 <5*  CREATININE 0.81  --  0.82  --   --  0.83 0.69 0.69  CALCIUM  8.7*  --  8.3*  --   --  8.2* 7.9* 7.9*  MG  --  2.1  --   --   --   --   --   --   PHOS  --  2.9  --   --   --   --   --   --    GFR Estimated Creatinine Clearance: 86.5 mL/min (by C-G formula based on SCr of 0.69 mg/dL). Liver Function Tests: Recent Labs  Lab 04/21/24 1532 04/22/24 1326  AST 9* 11*  ALT 6 5  ALKPHOS  --  92  BILITOT 0.2 <0.2  PROT 6.1 6.2*  ALBUMIN  --  3.8   No results for input(s): LIPASE, AMYLASE in the last 168 hours. No results for input(s): AMMONIA in  the last 168 hours. Coagulation profile Recent Labs  Lab 04/22/24 2106 04/23/24 0655 04/23/24 1506 04/23/24 1754  INR 1.1 1.2 1.2 1.2    CBC: Recent Labs  Lab 04/21/24 1532 04/22/24 1326 04/22/24 2106 04/23/24 0655 04/23/24 1506 04/23/24 1510 04/23/24 1754 04/23/24 2322 04/24/24 0506 04/24/24 1233 04/24/24 1758 04/24/24 2315 04/25/24 0424  WBC 11.8* 10.6*  --  9.2 11.0*  --  11.7*  --  14.4*  --   --   --  11.3*  NEUTROABS 10,018* 9.1*  --   --   --   --   --   --   --   --   --   --   --   HGB 4.1* 4.5*   < > 6.2* 8.3*   < > 8.5*   < > 7.8* 7.9* 7.8* 7.6* 7.4*  HCT 15.0* 16.1*   < > 20.2* 26.0*   < > 27.7*   < > 25.1* 25.7* 25.9* 24.7* 24.1*  MCV 86.7 89.0  --  87.8 86.1  --  89.6  --  88.7  --   --   --  88.6  PLT 354 371  --  271 293  --  265  --  273  --   --   --  251   < > = values in this interval not displayed.   Cardiac Enzymes: No results for input(s): CKTOTAL, CKMB, CKMBINDEX, TROPONINI in the last 168 hours. BNP: Invalid input(s): POCBNP CBG: Recent Labs  Lab 04/24/24 0621 04/24/24 0719 04/24/24 1155 04/24/24 1550 04/25/24 0606  GLUCAP 116* 122* 89 130* 90   D-Dimer No results for input(s): DDIMER in  the last 72 hours. Hgb A1c Recent Labs    04/22/24 2106  HGBA1C 4.6*   Lipid Profile Recent Labs    04/24/24 0506  TRIG 116   Thyroid function studies No results for input(s): TSH, T4TOTAL, T3FREE, THYROIDAB in the last 72 hours.  Invalid input(s): FREET3 Anemia work up Recent Labs    04/22/24 1442 04/22/24 2106  VITAMINB12  --  460  FOLATE  --  8.0  FERRITIN 23  --   TIBC 288  --   IRON  11*  --   RETICCTPCT  --  2.5   Microbiology No results found for this or any previous visit (from the past 240 hours).    Studies:  DG CHEST PORT 1 VIEW Result Date: 04/23/2024 CLINICAL DATA:  Endotracheal intubation EXAM: PORTABLE CHEST 1 VIEW COMPARISON:  April 22, 2024 FINDINGS: Stable cardiomegaly. Endotracheal tube is noted distal tip 1.5 cm above the carina. Right internal jugular Port-A-Cath is unchanged. Right lung is clear. Mild left retrocardiac opacity is noted concerning for possible atelectasis or infiltrate and associated effusion. IMPRESSION: Endotracheal tube is noted with distal tip 1.5 cm above the carina. Mild left retrocardiac opacity is noted concerning for possible atelectasis or infiltrate and associated effusion. Electronically Signed   By: Lynwood Landy Raddle M.D.   On: 04/23/2024 15:32   US  EKG SITE RITE Result Date: 04/23/2024 If Site Rite image not attached, placement could not be confirmed due to current cardiac rhythm.   Amber Stalls, MD 04/25/2024  11:44 AM

## 2024-04-25 NOTE — Progress Notes (Signed)
 2D echo attempted, pt care in room

## 2024-04-25 NOTE — Progress Notes (Signed)
 "  NAME:  Peggy House, MRN:  [id number], DOB:  [birth year], LOS: 3 ADMISSION DATE:  04/22/2024, CONSULTATION DATE:  04/24/2023 REFERRING MD:  Dr. Cindy, CHIEF COMPLAINT:  epistaxis   History of Present Illness:  Peggy House is a 64 y/o woman with a history of HHT, anemia requiring blood transfusions, who presented with Hb 4.1 after labs came back with low hemoglobin following visit with ID clinic. She complaints of DOE and fatigue over the past few days. She had dark tarry stools a few days prior to admission and several nosebleeds at home. She had epistaxis last night and again today during EGD. She had ~200cc blood suctioned out of orpharynx and gauze packed into her anterior nose during the EGD. She is not on chronic AC. She received 4 units of pRBC pre-EGD and another 1 units pRBC during the case. ENT was consulted and recommended using gauze with epi to stop the bleeding. She is transferring to the ICU post-EGD for airway protection until epistaxis is resolved.   Pertinent  Medical History  Per above  Significant Hospital Events: Including procedures, antibiotic start and stop dates in addition to other pertinent events   04/23/2023: Admitted for acute blood loss anemia and GIB 04/24/2023: epistaxis while in endoscopy, remained intubated for airway protection   Interim History / Subjective:  No rebleeding. Tolerating clears and wants to advance her diet.   Objective    Blood pressure (!) 113/98, pulse 69, temperature 99.1 F (37.3 C), temperature source Oral, resp. rate 16, height 5' 4 (1.626 m), weight 108.2 kg, SpO2 92%. CVP:  [9 mmHg-37 mmHg] 15 mmHg  FiO2 (%):  [24 %] 24 %   Intake/Output Summary (Last 24 hours) at 04/25/2024 1019 Last data filed at 04/25/2024 0600 Gross per 24 hour  Intake 813.19 ml  Output 1300 ml  Net -486.81 ml   Filed Weights   04/23/24 0500 04/24/24 0500 04/25/24 0500  Weight: 103.4 kg 109.5 kg 108.2 kg    Examination: General: elderly woman  sitting up in the chair in NAD HENT: /AT, eyes anicteric. No nasal packing or epistaxis Lungs: breathing comfortably on RA, CTAB.  Cardiovascular: S1S2, RRR Abdomen: obese, soft, NT Extremities:no peripheral edema Neuro: awake and alert, answering questions appropriately Chest wall: port accessed, dressed  BUN <5 Cr 0.69 WBC 11.3 H/H 7.4/24.1 Platelets 251   Resolved problem list  Endotracheally intubated for airway protection  Assessment and Plan   Severe epistaxis; aspirated blood during rapid onset epistaxis while under anesthesia; previously intubated -off oxygen -no nasal packing; stopped antibiotics since it has been out about 24 hrs now -if she has recurrent epistaxis, can have Afrin PRN, can try topical TXA, can order IV TXA again. -hematology will follow up again OP-- need to balance bleeding risk and non-healing risk for her foot  Hereditary hemorrhagic telangiectasias-- off VEG-F inhibitor due to healing issues after her ankle surgery GIB, likely due to angioectasias - on EGD multiple non-bleeding angioectasias treated with APC and clipping -- EGD did not explain the degree of anemia she had at presentation Epistaxis is acute on chronic Acute blood loss anemia on chronic anemia in the setting of above and epistaxis  - con't PPIT BID +carafate  -diet per GI recommendations -transfuse for Hb <7 or hemodynamically significant bleeding -CBC daily or as needed for change in clinical symptoms -d/w IV team, can keep port if she goes to 81M. Otherwise would deaccess port before leaving ICU  History of HTN -can reusme  PTA amlodipine ; hold lisinopril   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 and I&D  -linezolid  per ID  History of anxiety/depression -con't PTA celexa , xanax   Stable to transfer to the floor today. TRH to re-assume care tomorrow.   Labs   CBC: Recent Labs  Lab 04/21/24 1532 04/22/24 1326 04/22/24 2106 04/23/24 0655 04/23/24 1506  04/23/24 1510 04/23/24 1754 04/23/24 2322 04/24/24 0506 04/24/24 1233 04/24/24 1758 04/24/24 2315 04/25/24 0424  WBC 11.8* 10.6*  --  9.2 11.0*  --  11.7*  --  14.4*  --   --   --  11.3*  NEUTROABS 10,018* 9.1*  --   --   --   --   --   --   --   --   --   --   --   HGB 4.1* 4.5*   < > 6.2* 8.3*   < > 8.5*   < > 7.8* 7.9* 7.8* 7.6* 7.4*  HCT 15.0* 16.1*   < > 20.2* 26.0*   < > 27.7*   < > 25.1* 25.7* 25.9* 24.7* 24.1*  MCV 86.7 89.0  --  87.8 86.1  --  89.6  --  88.7  --   --   --  88.6  PLT 354 371  --  271 293  --  265  --  273  --   --   --  251   < > = values in this interval not displayed.    Basic Metabolic Panel: Recent Labs  Lab 04/22/24 1326 04/22/24 1442 04/23/24 0655 04/23/24 1510 04/23/24 1647 04/23/24 1754 04/24/24 0506 04/25/24 0424  NA 143  --  142 143 143 140 140 139  K 3.8  --  3.8 3.9 3.9 4.4 3.5 3.5  CL 110  --  111  --   --  113* 110 109  CO2 24  --  21*  --   --  16* 23 23  GLUCOSE 147*  --  105*  --   --  92 139* 90  BUN 9  --  10  --   --  8 8 <5*  CREATININE 0.81  --  0.82  --   --  0.83 0.69 0.69  CALCIUM  8.7*  --  8.3*  --   --  8.2* 7.9* 7.9*  MG  --  2.1  --   --   --   --   --   --   PHOS  --  2.9  --   --   --   --   --   --    GFR: Estimated Creatinine Clearance: 86.5 mL/min (by C-G formula based on SCr of 0.69 mg/dL). Recent Labs  Lab 04/23/24 1506 04/23/24 1754 04/24/24 0506 04/25/24 0424  WBC 11.0* 11.7* 14.4* 11.3*    Critical care time:      [name], DO 04/25/2024 10:19 AM  Pulmonary & Critical Care  For contact information, see Amion. If no response to pager, please call PCCM 2H APP. After hours, 7PM- 7AM, please call on call APP for 2H.           "

## 2024-04-25 NOTE — Progress Notes (Signed)
" °  Echocardiogram 2D Echocardiogram has been performed.  Peggy House 04/25/2024, 12:53 PM "

## 2024-04-25 NOTE — Plan of Care (Signed)
" °  Problem: Activity: Goal: Risk for activity intolerance will decrease 04/25/2024 0746 by Inga Spine, RN Outcome: Progressing 04/25/2024 0746 by Inga Spine, RN Outcome: Progressing   Problem: Pain Managment: Goal: General experience of comfort will improve and/or be controlled 04/25/2024 0746 by Inga Spine, RN Outcome: Progressing 04/25/2024 0746 by Inga Spine, RN Outcome: Progressing 04/25/2024 0745 by Inga Spine, RN Outcome: Progressing   Problem: Safety: Goal: Ability to remain free from injury will improve 04/25/2024 0746 by Inga Spine, RN Outcome: Progressing 04/25/2024 0746 by Inga Spine, RN Outcome: Progressing   Problem: Skin Integrity: Goal: Risk for impaired skin integrity will decrease 04/25/2024 0746 by Inga Spine, RN Outcome: Progressing 04/25/2024 0746 by Inga Spine, RN Outcome: Progressing 04/25/2024 0745 by Inga Spine, RN Outcome: Progressing   Problem: Activity: Goal: Ability to tolerate increased activity will improve 04/25/2024 0746 by Inga Spine, RN Outcome: Progressing 04/25/2024 0746 by Inga Spine, RN Outcome: Progressing 04/25/2024 0745 by Inga Spine, RN Outcome: Progressing   Problem: Respiratory: Goal: Ability to maintain a clear airway and adequate ventilation will improve 04/25/2024 0746 by Inga Spine, RN Outcome: Progressing 04/25/2024 0746 by Inga Spine, RN Outcome: Progressing 04/25/2024 0745 by Inga Spine, RN Outcome: Progressing   "

## 2024-04-25 NOTE — Evaluation (Signed)
 Physical Therapy Evaluation Patient Details Name: Peggy House MRN: [id number] DOB: May 09, 1960 Today's Date: 04/25/2024  History of Present Illness  64 y.o. female admitted 04/22/24 with DOE, fatigue, several nosebleeds, black tarry stools. Workup for anemia, GIB, significant epistaxis with bloody aspiration. ETT 1/9-1/10. PMH includes hereditary hemorrhagic telangiectasia (off VEG-F inhibitor due to healing issues after ankle sx), multiple R ankle sxs with impaired healing (04/2023-07/2023), HTN, DM2, GIB, tobacco use.   Clinical Impression  Pt presents with an overall decrease in functional mobility secondary to above. PTA, pt mod indep household ambulator with RW, uses w/c for community distances; lives with son's family who assists with iADLs as needed. Today, pt able to stand and take a couple steps with RW and minA; ambulation limited secondary to R ankle pain, which pt attributes to gauze dressing being too tight (RN notified). Pt reports plan to return home with family assist, interested in HHPT services. Will follow acutely to maximize functional mobility and independence prior to d/c home.       If plan is discharge home, recommend the following: A little help with walking and/or transfers;A little help with bathing/dressing/bathroom;Assistance with cooking/housework;Assist for transportation;Help with stairs or ramp for entrance   Can travel by private vehicle    Yes    Equipment Recommendations None recommended by PT  Recommendations for Other Services   Occupational Therapy   Functional Status Assessment Patient has had a recent decline in their functional status and demonstrates the ability to make significant improvements in function in a reasonable and predictable amount of time.     Precautions / Restrictions Precautions Precautions: Fall;Other (comment) Recall of Precautions/Restrictions: Intact Precaution/Restrictions Comments: h/o R ankle injury with multiple sxs  (wound currently wrapped in gauze) Restrictions Weight Bearing Restrictions Per Provider Order: No      Mobility  Bed Mobility               General bed mobility comments: received sitting in recliner    Transfers Overall transfer level: Needs assistance Equipment used: Rolling walker (2 wheels) Transfers: Sit to/from Stand Sit to Stand: Min assist, Contact guard assist           General transfer comment: 2x sit<>stand from recliner to RW, increased time and effort secondary to R foot pain; initial minA for trunk elevation, progressing to CGA on second trial    Ambulation/Gait Ambulation/Gait assistance: Min assist   Assistive device: Rolling walker (2 wheels)       Pre-gait activities: pt able to take minimal steps forwards/backwards secondary to significant pain with attempts at RLE weight bearing; minA for stability when attempting to take step with L foot while WB through RLE General Gait Details: pt able to take minimal steps forwards/backwards secondary to significant pain with attempts at RLE weight bearing  Stairs            Wheelchair Mobility     Tilt Bed    Modified Rankin (Stroke Patients Only)       Balance Overall balance assessment: Needs assistance Sitting-balance support: No upper extremity supported, Feet supported Sitting balance-Leahy Scale: Fair     Standing balance support: Reliant on assistive device for balance Standing balance-Leahy Scale: Poor                               Pertinent Vitals/Pain Pain Assessment Pain Assessment: Faces Faces Pain Scale: Hurts whole lot Pain Location: R ankle Pain Intervention(s): Monitored  during session, Limited activity within patient's tolerance, Other (comment) (notified RN pt things pain related to gauze dressing too tight)    Home Living Family/patient expects to be discharged to:: Private residence Living Arrangements: Children Available Help at Discharge:  Family;Available PRN/intermittently Type of Home: Apartment Home Access: Level entry       Home Layout: One level Home Equipment: Agricultural Consultant (2 wheels);BSC/3in1;Shower seat;Wheelchair - manual Additional Comments: Pt lives with son (who works 3pm-3am) and 79 y.o. grandson    Prior Function Prior Level of Function : Independent/Modified Independent             Mobility Comments: mod indep household ambulator with RW; manual wheelchair for community distances (mainly MD appts), able to self-propel ADLs Comments: reports mod indep ADLs, including sponge bathing. family assists with iADLs, including cooking and household tasks     Extremity/Trunk Assessment   Upper Extremity Assessment Upper Extremity Assessment: Generalized weakness    Lower Extremity Assessment Lower Extremity Assessment: Generalized weakness;RLE deficits/detail RLE Deficits / Details: h/o multiple R ankle sxs with chronic wound currently wrapped in gauze       Communication   Communication Communication: No apparent difficulties    Cognition Arousal: Alert Behavior During Therapy: WFL for tasks assessed/performed   PT - Cognitive impairments: No apparent impairments                         Following commands: Intact       Cueing Cueing Techniques: Verbal cues     General Comments General comments (skin integrity, edema, etc.): HR 59-69, SpO2 97% on RA, post-activity BP 143/75. educ re: role of acute PT, POC, activity recommendations, importance of mobility, potential d/c needs especially if still dealing with R foot pain; pt reports not interested in additional SNF rehab, feels she will be able to manage at home with family assist, interested in HHPT    Exercises     Assessment/Plan    PT Assessment Patient needs continued PT services  PT Problem List Decreased strength;Decreased range of motion;Decreased activity tolerance;Decreased balance;Decreased mobility;Pain       PT  Treatment Interventions DME instruction;Gait training;Functional mobility training;Therapeutic activities;Therapeutic exercise;Balance training;Patient/family education;Wheelchair mobility training    PT Goals (Current goals can be found in the Care Plan section)  Acute Rehab PT Goals Patient Stated Goal: decreased pain so I can walk PT Goal Formulation: With patient Time For Goal Achievement: 05/09/24 Potential to Achieve Goals: Good    Frequency Min 2X/week     Co-evaluation               AM-PAC PT 6 Clicks Mobility  Outcome Measure Help needed turning from your back to your side while in a flat bed without using bedrails?: A Little Help needed moving from lying on your back to sitting on the side of a flat bed without using bedrails?: A Little Help needed moving to and from a bed to a chair (including a wheelchair)?: A Little Help needed standing up from a chair using your arms (e.g., wheelchair or bedside chair)?: A Little Help needed to walk in hospital room?: Total Help needed climbing 3-5 steps with a railing? : Total 6 Click Score: 14    End of Session   Activity Tolerance: Patient tolerated treatment well;Patient limited by pain Patient left: in chair;with call bell/phone within reach Nurse Communication: Mobility status;Other (comment) (R ankle dressing and pain) PT Visit Diagnosis: Other abnormalities of gait and mobility (R26.89);Muscle  weakness (generalized) (M62.81);Pain Pain - Right/Left: Right Pain - part of body: Ankle and joints of foot    Time: 0907-0930 PT Time Calculation (min) (ACUTE ONLY): 23 min   Charges:   PT Evaluation $PT Eval Moderate Complexity: 1 Mod PT Treatments $Therapeutic Activity: 8-22 mins PT General Charges $$ ACUTE PT VISIT: 1 Visit        Darice Almas, PT, DPT Acute Rehabilitation Services  Personal: Secure Chat Rehab Office: [phone]  Darice LITTIE Almas 04/25/2024, 12:14 PM

## 2024-04-25 NOTE — Progress Notes (Signed)
 2D echo attempted, patient in chair. Will try later

## 2024-04-26 DIAGNOSIS — F419 Anxiety disorder, unspecified: Secondary | ICD-10-CM

## 2024-04-26 DIAGNOSIS — I10 Essential (primary) hypertension: Secondary | ICD-10-CM | POA: Diagnosis not present

## 2024-04-26 DIAGNOSIS — K922 Gastrointestinal hemorrhage, unspecified: Secondary | ICD-10-CM

## 2024-04-26 DIAGNOSIS — D62 Acute posthemorrhagic anemia: Secondary | ICD-10-CM | POA: Diagnosis not present

## 2024-04-26 DIAGNOSIS — D649 Anemia, unspecified: Secondary | ICD-10-CM | POA: Diagnosis not present

## 2024-04-26 DIAGNOSIS — F32A Depression, unspecified: Secondary | ICD-10-CM | POA: Diagnosis not present

## 2024-04-26 DIAGNOSIS — R040 Epistaxis: Secondary | ICD-10-CM | POA: Diagnosis not present

## 2024-04-26 DIAGNOSIS — I780 Hereditary hemorrhagic telangiectasia: Secondary | ICD-10-CM | POA: Diagnosis not present

## 2024-04-26 LAB — TYPE AND SCREEN
ABO/RH(D): A NEG
Antibody Screen: NEGATIVE
Unit division: 0
Unit division: 0
Unit division: 0
Unit division: 0
Unit division: 0
Unit division: 0
Unit division: 0

## 2024-04-26 LAB — BPAM RBC
Blood Product Expiration Date: 202601272359
Blood Product Expiration Date: 202601272359
Blood Product Expiration Date: 202602072359
Blood Product Expiration Date: 202602072359
Blood Product Expiration Date: 202602112359
Blood Product Expiration Date: 202602112359
Blood Product Expiration Date: 202602112359
ISSUE DATE / TIME: 202601081850
ISSUE DATE / TIME: 202601082114
ISSUE DATE / TIME: 202601090143
ISSUE DATE / TIME: 202601090927
ISSUE DATE / TIME: 202601091211
Unit Type and Rh: 600
Unit Type and Rh: 600
Unit Type and Rh: 600
Unit Type and Rh: 600
Unit Type and Rh: 600
Unit Type and Rh: 600
Unit Type and Rh: 600

## 2024-04-26 LAB — BASIC METABOLIC PANEL WITH GFR
Anion gap: 9 (ref 5–15)
BUN: 7 mg/dL — ABNORMAL LOW (ref 8–23)
CO2: 23 mmol/L (ref 22–32)
Calcium: 8.4 mg/dL — ABNORMAL LOW (ref 8.9–10.3)
Chloride: 110 mmol/L (ref 98–111)
Creatinine, Ser: 0.73 mg/dL (ref 0.44–1.00)
GFR, Estimated: >60 mL/min
Glucose, Bld: 125 mg/dL — ABNORMAL HIGH (ref 70–99)
Potassium: 4.1 mmol/L (ref 3.5–5.1)
Sodium: 142 mmol/L (ref 135–145)

## 2024-04-26 LAB — CBC
HCT: 26.3 % — ABNORMAL LOW (ref 36.0–46.0)
Hemoglobin: 8 g/dL — ABNORMAL LOW (ref 12.0–15.0)
MCH: 27.1 pg (ref 26.0–34.0)
MCHC: 30.4 g/dL (ref 30.0–36.0)
MCV: 89.2 fL (ref 80.0–100.0)
Platelets: 332 K/uL (ref 150–400)
RBC: 2.95 MIL/uL — ABNORMAL LOW (ref 3.87–5.11)
RDW: 18.1 % — ABNORMAL HIGH (ref 11.5–15.5)
WBC: 10.3 K/uL (ref 4.0–10.5)
nRBC: 0.2 % (ref 0.0–0.2)

## 2024-04-26 LAB — HEMOGLOBIN AND HEMATOCRIT, BLOOD
HCT: 25.4 % — ABNORMAL LOW (ref 36.0–46.0)
Hemoglobin: 7.7 g/dL — ABNORMAL LOW (ref 12.0–15.0)

## 2024-04-26 LAB — VANCOMYCIN, PEAK: Vancomycin Pk: 18 ug/mL — ABNORMAL LOW (ref 30–40)

## 2024-04-26 LAB — GLUCOSE, CAPILLARY: Glucose-Capillary: 134 mg/dL — ABNORMAL HIGH (ref 70–99)

## 2024-04-26 MED ORDER — OXYMETAZOLINE HCL 0.05 % NA SOLN
1.0000 | Freq: Two times a day (BID) | NASAL | Status: AC
  Administered 2024-04-26 – 2024-04-28 (×5): 1 via NASAL
  Filled 2024-04-26 (×2): qty 30

## 2024-04-26 MED ORDER — TRANEXAMIC ACID FOR EPISTAXIS
500.0000 mg | Freq: Once | TOPICAL | Status: AC
  Administered 2024-04-26: 500 mg via TOPICAL
  Filled 2024-04-26: qty 10

## 2024-04-26 MED ORDER — TRANEXAMIC ACID-NACL 1000-0.7 MG/100ML-% IV SOLN
1000.0000 mg | INTRAVENOUS | Status: AC
  Administered 2024-04-26: 1000 mg via INTRAVENOUS
  Filled 2024-04-26: qty 100

## 2024-04-26 NOTE — Progress Notes (Signed)
 " PROGRESS NOTE    Peggy House  FMW:[id number] DOB: [birth year] DOA: 04/22/2024 PCP: Nooruddin, Saad, MD  Subjective: Patient reports feeling much better overall, not having any nosebleeds anymore.    Hospital Course: Peggy House is a 64 year old female history of chronic anemia including iron  deficiency, AVMs, depression/anxiety, chronic right ankle wound, chronic thrombocytopenia, hereditary hemorrhagic telangiectasia (HHT), HTN, HLD, GERD, DM II, osteomyelitis of Rt fibula and tibia (has been on multiple course of antibiotics lately 12/19 was given linezolid  x 2 weeks) who presented to ED with chief complaint of progressive generalized weakness, fatigue and dizziness.  Reported she has had nosebleed and had a episode of black stool approximately 3 days ago.  She is not on any blood thinners.  She had a history of GI bleed and nosebleeds in the past.  On arrival her hemoglobin was 4.1.  She underwent EGD which was complicated by severe epistaxis at the onset of the procedure, requiring emergent intubation, nasal packing and transferred to the ICU.  EGD showed numerous gastric AVMs, 5 were treated with APC some with Hemoclip, video capsule endoscopy was also completed.  She was extubated, nasal packing removed and transferred to the hospitalist team.   Assessment and Plan:  Symptomatic anemia - due to GIB and epistaxis - presented with Hb of 4.1, she has known history of HHT.  EGD showed numerous gastric AVMs, treated with APC and Hemoclip.  Video capsule endoscopy showed several small nonbleeding AVMs.  She also had large-volume epistaxis which has now subsided.  She has received multiple units of PRBC - was also seen by hematology.  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 - Follows up closely with infectious disease Dr. Dennise - seen by ID here and was transitioned to IV vancomycin  due to the bacteria C striatum which is usually antibiotic resistant, and linezolid  long-term might be too toxic.  Plan to treat for 6 weeks of IV Vanco via Mediport, end date tentatively 06/04/2024.  Follow-up with Dr. Dennise in ID clinic as outpatient  Thrombocytopenia History of thrombocytopenia  currently platelets are stable   Depression with anxiety History of depression with anxiety continue as needed Xanax , Continue citalopram   HHT (hereditary hemorrhagic telangiectasia) Previous prolonged hospitalization due to HET, and GI bleed,  S/p embolization of the gastric artery branch on 12/10/2017, cauterization of stomach and 12/03/2018, epinephrine  injection into her ulcer on 11/01/2020 Frequent blood transfusion, with iron  infusions,and bevacizumab    Insomnia Continue as needed Xanax , trazodone   HLD  Not on any statins, checking lipid panel Patient may need to be started on statins  Diabetes mellitus  Per electronic medical record diabetes mellitus -She is not on any diabetic medications Last A1c was 5.0 a year ago -Will monitor  Essential hypertension With severe anemia, monitoring for hypotension - continue home medication: Norvasc  - lisinopril  remains on hold  for now     DVT prophylaxis: SCDs Start: 04/22/24 1441    Code Status: Full Code Disposition Plan: Home Reason for continuing need for hospitalization: Medical  readiness  Objective: Vitals:   04/26/24 1100 04/26/24 1107 04/26/24 1200 04/26/24 1300  BP: (!) 141/79  (!) 149/62   Pulse: 76  75 66  Resp: 17  16 16   Temp:  98.5 F (36.9 C)    TempSrc:  Oral    SpO2: 98%  98% 96%  Weight:      Height:        Intake/Output Summary (Last 24 hours) at 04/26/2024 1540 Last data filed at 04/25/2024 1900 Gross per 24 hour  Intake 420 ml  Output --  Net 420 ml   Filed Weights   04/23/24 0500 04/24/24 0500 04/25/24 0500  Weight: 103.4 kg 109.5 kg 108.2 kg    Examination:  Physical Exam Vitals and nursing note reviewed.  Constitutional:      General: She is not in acute distress. Cardiovascular:     Rate and Rhythm: Normal rate.  Pulmonary:     Effort: No respiratory distress.  Abdominal:     General: There is no distension.  Musculoskeletal:     Comments: Right ankle in dressing      Data Reviewed: I have personally reviewed following labs and imaging studies  CBC: Recent Labs  Lab 04/21/24 1532 04/22/24 1326 04/22/24 2106 04/23/24 1506 04/23/24 1510 04/23/24 1754 04/23/24 2322 04/24/24 0506 04/24/24 1233 04/24/24 1758 04/24/24 2315 04/25/24 0424 04/26/24 0349  WBC 11.8* 10.6*   < > 11.0*  --  11.7*  --  14.4*  --   --   --  11.3* 10.3  NEUTROABS 10,018* 9.1*  --   --   --   --   --   --   --   --   --   --   --   HGB 4.1* 4.5*   < > 8.3*   < > 8.5*   < > 7.8* 7.9* 7.8* 7.6* 7.4* 8.0*  HCT 15.0* 16.1*   < > 26.0*   < > 27.7*   < > 25.1* 25.7* 25.9* 24.7* 24.1* 26.3*  MCV 86.7 89.0   < > 86.1  --  89.6  --  88.7  --   --   --  88.6 89.2  PLT 354 371   < > 293  --  265  --  273  --   --   --  251 332   < > = values in this interval not displayed.   Basic Metabolic Panel: Recent Labs  Lab 04/22/24 1442 04/23/24 0655 04/23/24 1510 04/23/24 1647 04/23/24 1754 04/24/24 0506 04/25/24 0424 04/26/24 0349  NA  --  142   < > 143 140 140 139 142  K  --  3.8   < > 3.9 4.4 3.5 3.5 4.1  CL  --  111  --   --  113* 110 109  110  CO2  --  21*  --   --  16* 23 23 23   GLUCOSE  --  105*  --   --  92 139* 90 125*  BUN  --  10  --   --  8 8 <5* 7*  CREATININE  --  0.82  --   --  0.83 0.69 0.69 0.73  CALCIUM   --  8.3*  --   --  8.2* 7.9* 7.9* 8.4*  MG 2.1  --   --   --   --   --   --   --  PHOS 2.9  --   --   --   --   --   --   --    < > = values in this interval not displayed.   GFR: Estimated Creatinine Clearance: 86.5 mL/min (by C-G formula based on SCr of 0.73 mg/dL). Liver Function Tests: Recent Labs  Lab 04/21/24 1532 04/22/24 1326  AST 9* 11*  ALT 6 5  ALKPHOS  --  92  BILITOT 0.2 <0.2  PROT 6.1 6.2*  ALBUMIN  --  3.8   No results for input(s): LIPASE, AMYLASE in the last 168 hours. No results for input(s): AMMONIA in the last 168 hours. Coagulation Profile: Recent Labs  Lab 04/22/24 2106 04/23/24 0655 04/23/24 1506 04/23/24 1754  INR 1.1 1.2 1.2 1.2   Cardiac Enzymes: No results for input(s): CKTOTAL, CKMB, CKMBINDEX, TROPONINI in the last 168 hours. ProBNP, BNP (last 5 results) No results for input(s): PROBNP, BNP in the last 8760 hours. HbA1C: No results for input(s): HGBA1C in the last 72 hours. CBG: Recent Labs  Lab 04/24/24 1155 04/24/24 1550 04/25/24 0606 04/25/24 2248 04/26/24 0804  GLUCAP 89 130* 90 123* 134*   Lipid Profile: Recent Labs    04/24/24 0506  TRIG 116   Thyroid Function Tests: No results for input(s): TSH, T4TOTAL, FREET4, T3FREE, THYROIDAB in the last 72 hours. Anemia Panel: No results for input(s): VITAMINB12, FOLATE, FERRITIN, TIBC, IRON , RETICCTPCT in the last 72 hours. Sepsis Labs: No results for input(s): PROCALCITON, LATICACIDVEN in the last 168 hours.  No results found for this or any previous visit (from the past 240 hours).   Radiology Studies: ECHOCARDIOGRAM COMPLETE Result Date: 04/25/2024    ECHOCARDIOGRAM REPORT   Patient Name:   Peggy House Date of Exam: 04/25/2024 Medical  Rec #:  [id number]              Height:       64.0 in Accession #:    [id number]             Weight:       238.5 lb Date of Birth:  March 08, 1961              BSA:          2.109 m Patient Age:    63 years               BP:           136/63 mmHg Patient Gender: F                      HR:           119 bpm. Exam Location:  Inpatient Procedure: 2D Echo, Cardiac Doppler, Color Doppler and Strain Analysis (Both            Spectral and Color Flow Doppler were utilized during procedure). Indications:    R94.31 Abnormal EKG  History:        Patient has prior history of Echocardiogram examinations, most                 recent 04/14/2023. Signs/Symptoms:Syncope,                 Dizziness/Lightheadedness and Fatigue; Risk Factors:Diabetes,                 Hypertension, Dyslipidemia and Current Smoker. Port.  Sonographer:    Ellouise Mose RDCS Referring Phys: [id number] Peggy House  Sonographer Comments: Patient is  obese. Image acquisition challenging due to patient body habitus. IMPRESSIONS  1. Left ventricular ejection fraction, by estimation, is 65 to 70%. Left ventricular ejection fraction by 2D MOD biplane is 69.5 %. The left ventricle has normal function. The left ventricle has no regional wall motion abnormalities. There is severe left ventricular hypertrophy. Left ventricular diastolic parameters are consistent with Grade II diastolic dysfunction (pseudonormalization). Elevated left atrial pressure. The average left ventricular global longitudinal strain is -24.3 %. The global longitudinal strain is normal.  2. Right ventricular systolic function is normal. The right ventricular size is moderately enlarged. There is severely elevated pulmonary artery systolic pressure. The estimated right ventricular systolic pressure is 60.2 mmHg.  3. Left atrial size was moderately dilated.  4. Right atrial size was moderately dilated.  5. The mitral valve is normal in structure. Trivial mitral valve regurgitation. No evidence of mitral  stenosis.  6. Tricuspid valve regurgitation is mild to moderate.  7. There is a calcified nodule on the left coronary cusp (#32) and mild to moderate AS with PV 3.1 m/sec, MG 19 mmhg and DVI 0.46. There is evidence of high flow state with LVOT VTI 29 cm. The aortic valve is tricuspid. Aortic valve regurgitation is trivial. Mild to moderate aortic valve stenosis.  8. There is Moderate (Grade III) atheroma plaque involving the aortic arch.  9. There is late systolic flow reversal of hepatic vein suggestive noncompliant RV. The inferior vena cava is dilated in size with <50% respiratory variability, suggesting right atrial pressure of 15 mmHg. Comparison(s): Prior images reviewed side by side. EF is similar to prior. There is now evidence of pulmonary HTN with RVSP 60, dilated RV/RA, and new aortic stenosis. FINDINGS  Left Ventricle: Left ventricular ejection fraction, by estimation, is 65 to 70%. Left ventricular ejection fraction by 2D MOD biplane is 69.5 %. The left ventricle has normal function. The left ventricle has no regional wall motion abnormalities. The average left ventricular global longitudinal strain is -24.3 %. Strain was performed and the global longitudinal strain is normal. The left ventricular internal cavity size was normal in size. There is severe left ventricular hypertrophy. Left ventricular diastolic parameters are consistent with Grade II diastolic dysfunction (pseudonormalization). Elevated left atrial pressure. Right Ventricle: The right ventricular size is moderately enlarged. No increase in right ventricular wall thickness. Right ventricular systolic function is normal. There is severely elevated pulmonary artery systolic pressure. The tricuspid regurgitant velocity is 3.36 m/s, and with an assumed right atrial pressure of 15 mmHg, the estimated right ventricular systolic pressure is 60.2 mmHg. Left Atrium: Left atrial size was moderately dilated. Right Atrium: Right atrial size was  moderately dilated. Pericardium: There is no evidence of pericardial effusion. Mitral Valve: The mitral valve is normal in structure. Trivial mitral valve regurgitation. No evidence of mitral valve stenosis. Tricuspid Valve: The tricuspid valve is normal in structure. Tricuspid valve regurgitation is mild to moderate. No evidence of tricuspid stenosis. Aortic Valve: There is a calcified nodule on the left coronary cusp (#32) and mild to moderate AS with PV 3.1 m/sec, MG 19 mmhg and DVI 0.46. There is evidence of high flow state with LVOT VTI 29 cm. The aortic valve is tricuspid. Aortic valve regurgitation is trivial. Mild to moderate aortic stenosis is present. Aortic valve mean gradient measures 19.0 mmHg. Aortic valve peak gradient measures 39.4 mmHg. Aortic valve area, by VTI measures 1.45 cm. Pulmonic Valve: The pulmonic valve was normal in structure. Pulmonic valve regurgitation is not visualized. No  evidence of pulmonic stenosis. Aorta: The aortic root and ascending aorta are structurally normal, with no evidence of dilitation. There is moderate (Grade III) atheroma plaque involving the aortic arch. Venous: A normal flow pattern is recorded from the right upper pulmonary vein. There is late systolic flow reversal of hepatic vein suggestive noncompliant RV. The inferior vena cava is dilated in size with less than 50% respiratory variability, suggesting right atrial pressure of 15 mmHg. IAS/Shunts: No atrial level shunt detected by color flow Doppler.  LEFT VENTRICLE PLAX 2D                        Biplane EF (MOD) LVIDd:         5.40 cm         LV Biplane EF:   Left LVIDs:         3.00 cm                          ventricular LV PW:         1.30 cm                          ejection LV IVS:        1.10 cm                          fraction by LVOT diam:     2.00 cm                          2D MOD LV SV:         91                               biplane is LV SV Index:   43                               69.5 %. LVOT  Area:     3.14 cm LV IVRT:       85 msec         Diastology                                LV e' medial:    10.80 cm/s                                LV E/e' medial:  14.6 LV Volumes (MOD)               LV e' lateral:   11.20 cm/s LV vol d, MOD    150.0 ml      LV E/e' lateral: 14.1 A2C: LV vol d, MOD    147.0 ml      2D Longitudinal A4C:                           Strain LV vol s, MOD    50.4 ml       2D Strain GLS   -26.4 % A2C:                           (  A4C): LV vol s, MOD    39.2 ml       2D Strain GLS   -23.8 % A4C:                           (A3C): LV SV MOD A2C:   99.6 ml       2D Strain GLS   -22.7 % LV SV MOD A4C:   147.0 ml      (A2C): LV SV MOD BP:    103.3 ml      2D Strain GLS   -24.3 %                                Avg: RIGHT VENTRICLE             IVC RV S prime:     14.40 cm/s  IVC diam: 2.90 cm TAPSE (M-mode): 2.9 cm                             PULMONARY VEINS                             Diastolic Velocity: 42.50 cm/s                             S/D Velocity:       1.40                             Systolic Velocity:  60.00 cm/s LEFT ATRIUM             Index        RIGHT ATRIUM           Index LA diam:        4.00 cm 1.90 cm/m   RA Area:     26.10 cm LA Vol (A2C):   95.1 ml 45.10 ml/m  RA Volume:   85.40 ml  40.50 ml/m LA Vol (A4C):   75.0 ml 35.57 ml/m LA Biplane Vol: 88.9 ml 42.16 ml/m  AORTIC VALVE AV Area (Vmax):    1.31 cm AV Area (Vmean):   1.49 cm AV Area (VTI):     1.45 cm AV Vmax:           314.00 cm/s AV Vmean:          187.750 cm/s AV VTI:            0.631 m AV Peak Grad:      39.4 mmHg AV Mean Grad:      19.0 mmHg LVOT Vmax:         131.00 cm/s LVOT Vmean:        89.300 cm/s LVOT VTI:          0.291 m LVOT/AV VTI ratio: 0.46  AORTA Ao Root diam: 3.30 cm Ao Asc diam:  3.70 cm MITRAL VALVE                TRICUSPID VALVE MV Area (PHT): 2.76 cm     TR Peak grad:   45.2 mmHg MV Decel Time: 275 msec     TR Vmax:        336.00 cm/s MV E velocity: 158.00 cm/s MV  A velocity: 123.00 cm/s   SHUNTS MV E/A ratio:  1.28         Systemic VTI:  0.29 m                             Systemic Diam: 2.00 cm Joelle Azobou Tonleu Electronically signed by Joelle Cedars Tonleu Signature Date/Time: 04/25/2024/3:02:27 PM    Final     Scheduled Meds:  amLODipine   10 mg Oral Daily   Chlorhexidine  Gluconate Cloth  6 each Topical Daily   citalopram   20 mg Oral Daily   gabapentin   100 mg Oral QHS   pantoprazole  (PROTONIX ) IV  40 mg Intravenous Q12H   polyethylene glycol  17 g Oral Daily   senna  1 tablet Oral BID   sodium chloride  flush  10-40 mL Intracatheter Q12H   sodium chloride  flush  3 mL Intravenous Q12H   sodium chloride  flush  3 mL Intravenous Q12H   sucralfate   1 g Oral TID WC & HS   Continuous Infusions:  vancomycin  1,500 mg (04/26/24 1535)     LOS: 4 days   Time spent: 40 minutes  Casimer Dare, MD  [facility]  04/26/2024, 3:40 PM   "

## 2024-04-26 NOTE — Progress Notes (Signed)
 OTOHNS PROGRESS NOTE  ENT consulted by hospitalist due to uncontrolled and persistent left-sided epistaxis.  On exam, patient had large clot and gauze in left nasal cavity, with clot in right nasal cavity.  Procedure: control of recurrent epistaxis Clot was evacuated using suction, and bilateral nasal cavities were copiously irrigated with Afrin.  Following this, but brisk bleeding was noted from the left caudal septum, anterior to the middle turbinate.  Surgiflo was placed in the nasal cavity as well as NasoPore covered in Bactroban ointment.  Following this, patient was noted to be hemostatic.  There was no active bleeding noted in the right nasal cavity.  I suspect bleeding was due to crossover from severe bleeding on the left.  Unfortunately, pharmacy states that Avastin  is not available to administer tonight, but should be available in the morning.  Given severe episode of nasal bleeding, recommend resuming Avastin  as per medical oncology.  If bleeding recurs tonight, can instill Afrin into nasal packing, maintain firm pressure over nares.  All nasal packing placed today is dissolvable, does not need to be removed.  I discussed with patient outpatient follow-up with rhinology in [facility] to discuss possible surgical interventions given recurrent nature of epistaxis.  She states that she would consider this.  Thank you for allowing me to participate in the care of this patient. Please do not hesitate to contact me with any questions or concerns.   Gerard DELENA Shope, DO Otolaryngology [facility] Cell: [phone]

## 2024-04-26 NOTE — Evaluation (Signed)
 Occupational Therapy Evaluation Patient Details Name: Peggy House MRN: [id number] DOB: 1961-04-12 Today's Date: 04/26/2024   History of Present Illness   64 y.o. female admitted 04/22/24 with DOE, fatigue, several nosebleeds, black tarry stools. Workup for anemia, GIB, significant epistaxis with bloody aspiration. ETT 1/9-1/10. PMH includes hereditary hemorrhagic telangiectasia (off VEG-F inhibitor due to healing issues after ankle sx), multiple R ankle sxs with impaired healing (04/2023-07/2023), HTN, DM2, GIB, tobacco use.     Clinical Impressions PTA pt lives with her son @ modified independent level using a RW. Pt CGA with mobility and min A with LB ADL tasks @ RW level due to deficits listed below. Acute OT to follow to facilitate safe DC home with son, who works during the day. VSS on RA during session. Recommend HHOT at DC.      If plan is discharge home, recommend the following:   A little help with walking and/or transfers;A little help with bathing/dressing/bathroom;Assist for transportation     Functional Status Assessment   Patient has had a recent decline in their functional status and demonstrates the ability to make significant improvements in function in a reasonable and predictable amount of time.     Equipment Recommendations   None recommended by OT     Recommendations for Other Services         Precautions/Restrictions   Precautions Precautions: Fall;Other (comment) Recall of Precautions/Restrictions: Intact Precaution/Restrictions Comments: h/o R ankle injury with multiple sxs (wound currently wrapped in gauze)     Mobility Bed Mobility               General bed mobility comments: OOB in chair    Transfers Overall transfer level: Needs assistance Equipment used: Rolling walker (2 wheels) Transfers: Sit to/from Stand Sit to Stand: Contact guard assist           General transfer comment: pt with flexed posture, relied on  momentum to power up into standing      Balance Overall balance assessment: Needs assistance Sitting-balance support: No upper extremity supported, Feet supported Sitting balance-Leahy Scale: Fair       Standing balance-Leahy Scale: Poor Standing balance comment: dependent on BUE support, not tested without UE support at this time                           ADL either performed or assessed with clinical judgement   ADL Overall ADL's : Needs assistance/impaired Eating/Feeding: Independent   Grooming: Wash/dry hands;Wash/dry face;Oral care;Brushing hair;Contact guard assist;Standing   Upper Body Bathing: Set up;Sitting   Lower Body Bathing: Minimal assistance;Sit to/from stand   Upper Body Dressing : Set up;Sitting   Lower Body Dressing: Minimal assistance;Sit to/from stand   Toilet Transfer: Contact guard assist;Ambulation;Comfort height toilet;Rolling walker (2 wheels)   Toileting- Clothing Manipulation and Hygiene: Contact guard assist       Functional mobility during ADLs: Contact guard assist General ADL Comments: Pt with difficulty accessing feet when bending forward     Vision Baseline Vision/History: 0 No visual deficits       Perception         Praxis         Pertinent Vitals/Pain Pain Assessment Pain Assessment: Faces Faces Pain Scale: Hurts little more Pain Location: R ankle Pain Intervention(s): Limited activity within patient's tolerance, Monitored during session, Repositioned     Extremity/Trunk Assessment Upper Extremity Assessment Upper Extremity Assessment: Generalized weakness   Lower Extremity Assessment Lower  Extremity Assessment: Generalized weakness RLE Deficits / Details: h/o multiple R ankle sxs with chronic wound currently wrapped in gauze   Cervical / Trunk Assessment Cervical / Trunk Assessment: Kyphotic   Communication Communication Communication: No apparent difficulties   Cognition Arousal: Alert Behavior  During Therapy: WFL for tasks assessed/performed                                 Following commands: Intact       Cueing  General Comments      VSS on RA   Exercises     Shoulder Instructions      Home Living Family/patient expects to be discharged to:: Private residence Living Arrangements: Children Available Help at Discharge: Family;Available PRN/intermittently Type of Home: Apartment Home Access: Level entry     Home Layout: One level     Bathroom Shower/Tub: Sponge bathes at baseline;Tub/shower unit   Bathroom Toilet: Standard Bathroom Accessibility: Yes How Accessible: Accessible via wheelchair Home Equipment: Rolling Walker (2 wheels);BSC/3in1;Shower seat;Wheelchair - manual   Additional Comments: Pt lives with son (who works 3pm-3am) and 75 y.o. grandson      Prior Functioning/Environment Prior Level of Function : Independent/Modified Independent             Mobility Comments: mod indep household ambulator with RW; manual wheelchair for community distances (mainly MD appts), able to self-propel ADLs Comments: reports mod indep ADLs, including sponge bathing. family assists with iADLs, including cooking and household tasks    OT Problem List: Decreased strength;Decreased activity tolerance;Decreased range of motion;Decreased knowledge of use of DME or AE;Obesity;Cardiopulmonary status limiting activity   OT Treatment/Interventions: Self-care/ADL training;Therapeutic exercise;Energy conservation;DME and/or AE instruction;Therapeutic activities;Patient/family education      OT Goals(Current goals can be found in the care plan section)   Acute Rehab OT Goals Patient Stated Goal: home with son OT Goal Formulation: With patient Time For Goal Achievement: 05/10/24 Potential to Achieve Goals: Good   OT Frequency:  Min 2X/week    Co-evaluation              AM-PAC OT 6 Clicks Daily Activity     Outcome Measure Help from  another person eating meals?: None Help from another person taking care of personal grooming?: A Little Help from another person toileting, which includes using toliet, bedpan, or urinal?: A Little Help from another person bathing (including washing, rinsing, drying)?: A Little Help from another person to put on and taking off regular upper body clothing?: A Little Help from another person to put on and taking off regular lower body clothing?: A Little 6 Click Score: 19   End of Session Equipment Utilized During Treatment: Gait belt;Rolling walker (2 wheels) Nurse Communication: Mobility status  Activity Tolerance: Patient tolerated treatment well Patient left: Other (comment) (on BSC in bathroom)  OT Visit Diagnosis: Unsteadiness on feet (R26.81);Other abnormalities of gait and mobility (R26.89);Muscle weakness (generalized) (M62.81)                Time: 8440-8374 OT Time Calculation (min): 26 min Charges:  OT General Charges $OT Visit: 1 Visit OT Evaluation $OT Eval Moderate Complexity: 1 Mod OT Treatments $Self Care/Home Management : 8-22 mins  Kreg Sink, OT/L   Acute OT Clinical Specialist Acute Rehabilitation Services Pager [phone] Office [phone]   [facility] 04/26/2024, 4:45 PM

## 2024-04-26 NOTE — Progress Notes (Signed)
 "  NAME:  Peggy House, MRN:  [id number], DOB:  Dec 27, 1960, LOS: 4 ADMISSION DATE:  04/22/2024, CONSULTATION DATE:  04/24/2023 REFERRING MD:  Dr. Cindy, CHIEF COMPLAINT:  epistaxis   History of Present Illness:  [name] is a 64 y/o woman with a history of HHT, anemia requiring blood transfusions, who presented with Hb 4.1 after labs came back with low hemoglobin following visit with ID clinic. She complaints of DOE and fatigue over the past few days. She had dark tarry stools a few days prior to admission and several nosebleeds at home. She had epistaxis last night and again today during EGD. She had ~200cc blood suctioned out of orpharynx and gauze packed into her anterior nose during the EGD. She is not on chronic AC. She received 4 units of pRBC pre-EGD and another 1 units pRBC during the case. ENT was consulted and recommended using gauze with epi to stop the bleeding. She is transferring to the ICU post-EGD for airway protection until epistaxis is resolved.   Pertinent  Medical History  Per above  Significant Hospital Events: Including procedures, antibiotic start and stop dates in addition to other pertinent events   04/23/2023: Admitted for acute blood loss anemia and GIB 04/24/2023: epistaxis while in endoscopy, remained intubated for airway protection   Interim History / Subjective:  04/26/24: called to assess pt for transfer back to ICU with repeated large volume epistaxis. ENT assessed at bedside and applied packing to nare. Will need to transfer for monitoring now and check stat h&h.   Objective    Blood pressure (!) 156/72, pulse 75, temperature 98.7 F (37.1 C), temperature source Oral, resp. rate (!) 9, height 5' 4 (1.626 m), weight 108.2 kg, SpO2 99%.       No intake or output data in the 24 hours ending 04/26/24 2215  Filed Weights   04/23/24 0500 04/24/24 0500 04/25/24 0500  Weight: 103.4 kg 109.5 kg 108.2 kg    Examination: General: nad reclining  comfortably in bed packing in place with tape HENT: [place]/AT, eyes anicteric. Packing in place (dissolvable) no further bleeding noted Lungs: breathing comfortably on RA, CTAB.  Cardiovascular: S1S2, RRR Abdomen: obese, soft, NT Extremities:no peripheral edema Neuro: awake and alert, answering questions appropriately      Resolved problem list  Endotracheally intubated for airway protection  Assessment and Plan   Severe epistaxis; aspirated blood during rapid onset epistaxis while under anesthesia; previously intubated -supplemental oxygen as needed for sat >92% -per ENT will give Avastin  in am -new packing dissolvable per ENT -if she has recurrent epistaxis, can have Afrin PRN, can try topical TXA, can order IV TXA again. -hematology will follow up again OP-- need to balance bleeding risk and non-healing risk for her foot  Hereditary hemorrhagic telangiectasias-- off VEG-F inhibitor due to healing issues after her ankle surgery GIB, likely due to angioectasias - on EGD multiple non-bleeding angioectasias treated with APC and clipping -- EGD did not explain the degree of anemia she had at presentation Epistaxis is acute on chronic Acute blood loss anemia on chronic anemia in the setting of above and epistaxis  - con't PPI BID +carafate  -diet per GI recommendations -transfuse for Hb <7 or hemodynamically significant bleeding  History of HTN -can reusme PTA amlodipine   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7* 24.1* 26.3*  MCV 86.7 89.0   < > 86.1  --  89.6  --  88.7  --   --   --  88.6 89.2  PLT 354 371   < > 293  --  265  --  273  --   --   --  251 332   < > = values in this interval not displayed.    Basic Metabolic Panel: Recent Labs  Lab 04/22/24 1442 04/23/24 0655 04/23/24 1510 04/23/24 1647 04/23/24 1754 04/24/24 0506 04/25/24 0424 04/26/24 0349  NA  --  142   < > 143 140 140 139 142  K  --  3.8   < > 3.9 4.4 3.5 3.5 4.1  CL  --  111  --   --  113* 110 109 110  CO2  --  21*  --   --  16* 23 23 23   GLUCOSE  --  105*  --   --  92 139* 90 125*  BUN  --  10  --   --  8 8 <5* 7*  CREATININE  --  0.82  --   --  0.83 0.69 0.69 0.73  CALCIUM   --  8.3*  --   --  8.2* 7.9* 7.9* 8.4*  MG 2.1  --   --   --   --   --   --   --   PHOS 2.9  --   --   --   --   --   --   --    < > = values in this interval not displayed.   GFR: Estimated Creatinine Clearance: 86.5 mL/min (by C-G formula based on SCr of 0.73 mg/dL). Recent Labs  Lab 04/23/24 1754 04/24/24 0506 04/25/24 0424 04/26/24 0349  WBC 11.7* 14.4* 11.3* 10.3    Critical care time:     Harlene Na, DO 04/26/2024 10:15 PM [facility]             "

## 2024-04-26 NOTE — Progress Notes (Addendum)
 Patient arrived on unit around 24, during report, RN noted patient to be bleeding profusely through her nares. After 1.5 hrs of interventions with rapid response RN and MD, patient still with continued nose bleeds. Txa IV, nebulizer and topical, afrin, Ice application, pressure application and nasal packing with afrin impregnated gauze minimally successful. Bleeding reoccured after about 5 minutes. Night team and rapid response still at the bedside.   VS remained stable.

## 2024-04-26 NOTE — Significant Event (Signed)
 Rapid Response Event Note   Reason for Call :  Epistaxis  Pt developed nosebleed just after arriving on unit.   Initial Focused Assessment:  Pt sitting up in bed with eyes open, AO. Manual pressure is being held to nose just above nares. Pt is spitting/coughing blood out of her mouth. With no manual pressure there is a continuous flow of blood out of L nare. She has a large amount of blood on gown. Lungs clear/diminished. Skin warm/dry.   HR-79, BP-150/80, RR-22, SpO2-100% on RA  Interventions:  Manual pressure x 1 hour Afrin nasal spray Afrin soaked gauze TXA IV  TXA soaked gauze H/H, T and S ENT to bedside: Surgiflo and Nasopore with bactroban placed in L nare PCCM consulted: Tx to ICU Plan of Care:  Tx to 2H04 for closer monitoring. If epistaxis reoccurs, ENT recommends using afrin spray around gauze and holding manual pressure just above the nares. Please call RRT if further assistance needed.  Event Summary:   MD Notified: Dr. Charlton notified and came to bedside, ENT MD [facility] notified and came to bedside.  Call Time:1924 Arrival Time:1927 End Time:2230  Tish Graeme Piety, RN

## 2024-04-26 NOTE — Progress Notes (Signed)
 Physical Therapy Treatment Patient Details Name: Peggy House MRN: [id number] DOB: 12-19-1960 Today's Date: 04/26/2024   History of Present Illness 64 y.o. female admitted 04/22/24 with DOE, fatigue, several nosebleeds, black tarry stools. Workup for anemia, GIB, significant epistaxis with bloody aspiration. ETT 1/9-1/10. PMH includes hereditary hemorrhagic telangiectasia (off VEG-F inhibitor due to healing issues after ankle sx), multiple R ankle sxs with impaired healing (04/2023-07/2023), HTN, DM2, GIB, tobacco use.    PT Comments  The pt was agreeable to session with focus on progressing mobility as her R ankle is hurting less at this time. Pt continues to demo limited strength and power in LE to complete sit-stand transfer, and requires increased time and cues to reach full stand with trunk extended. Pt was able to progress ambulation distance this morning, completing x2 walking bouts (5 ft +15 ft) with RW. Will continue to benefit from skilled PT to progress functional strength, power, endurance to complete household distance mobility, and independence with transfers.     If plan is discharge home, recommend the following: A little help with walking and/or transfers;A little help with bathing/dressing/bathroom;Assistance with cooking/housework;Assist for transportation;Help with stairs or ramp for entrance   Can travel by private vehicle        Equipment Recommendations  None recommended by PT    Recommendations for Other Services       Precautions / Restrictions Precautions Precautions: Fall;Other (comment) Recall of Precautions/Restrictions: Intact Precaution/Restrictions Comments: h/o R ankle injury with multiple sxs (wound currently wrapped in gauze) Restrictions Weight Bearing Restrictions Per Provider Order: No     Mobility  Bed Mobility Overal bed mobility: Needs Assistance Bed Mobility: Supine to Sit     Supine to sit: HOB elevated, Used rails     General  bed mobility comments: increased time and effort    Transfers Overall transfer level: Needs assistance Equipment used: Rolling walker (2 wheels) Transfers: Sit to/from Stand Sit to Stand: Min assist, Contact guard assist           General transfer comment: increased time and momentum to power up, significant trunk flexion but able to extend at trunk with increased time and cues. no overt buckling. completed x4 in session    Ambulation/Gait Ambulation/Gait assistance: Min assist, Contact guard assist Gait Distance (Feet): 5 Feet (+ 39ft) Assistive device: Rolling walker (2 wheels) Gait Pattern/deviations: Step-to pattern, Decreased stance time - right, Decreased stride length, Decreased weight shift to right, Shuffle Gait velocity: decreased Gait velocity interpretation: <1.31 ft/sec, indicative of household ambulator   General Gait Details: pt with trunk flexed and short, shuffling steps, minimal wt on RLE and heavy dependence on BUE support. denies pain in R foot      Balance Overall balance assessment: Needs assistance Sitting-balance support: No upper extremity supported, Feet supported Sitting balance-Leahy Scale: Fair     Standing balance support: Reliant on assistive device for balance Standing balance-Leahy Scale: Poor Standing balance comment: dependent on BUE support, not tested without UE support at this time                            Communication Communication Communication: No apparent difficulties  Cognition Arousal: Alert Behavior During Therapy: WFL for tasks assessed/performed   PT - Cognitive impairments: No apparent impairments                       PT - Cognition Comments: pt with slight drowsiness when  not directly engaged but able to answer questions appropriately Following commands: Intact      Cueing Cueing Techniques: Verbal cues  Exercises      General Comments General comments (skin integrity, edema, etc.): VSS  on RA. BP 140/70s      Pertinent Vitals/Pain Pain Assessment Pain Assessment: No/denies pain Faces Pain Scale: Hurts a little bit Pain Location: R ankle Pain Intervention(s): Limited activity within patient's tolerance, Monitored during session, Repositioned     PT Goals (current goals can now be found in the care plan section) Acute Rehab PT Goals Patient Stated Goal: to return home PT Goal Formulation: With patient Time For Goal Achievement: 05/09/24 Potential to Achieve Goals: Good Progress towards PT goals: Progressing toward goals    Frequency    Min 2X/week       AM-PAC PT 6 Clicks Mobility   Outcome Measure  Help needed turning from your back to your side while in a flat bed without using bedrails?: A Little Help needed moving from lying on your back to sitting on the side of a flat bed without using bedrails?: A Little Help needed moving to and from a bed to a chair (including a wheelchair)?: A Little Help needed standing up from a chair using your arms (e.g., wheelchair or bedside chair)?: A Little Help needed to walk in hospital room?: Total (<20 ft) Help needed climbing 3-5 steps with a railing? : Total 6 Click Score: 14    End of Session Equipment Utilized During Treatment: Gait belt Activity Tolerance: Patient tolerated treatment well;Patient limited by pain Patient left: in chair;with call bell/phone within reach Nurse Communication: Mobility status (VSS, pt can use BSC) PT Visit Diagnosis: Other abnormalities of gait and mobility (R26.89);Muscle weakness (generalized) (M62.81);Pain Pain - Right/Left: Right Pain - part of body: Ankle and joints of foot     Time: 0941-1008 PT Time Calculation (min) (ACUTE ONLY): 27 min  Charges:    $Gait Training: 8-22 mins $Therapeutic Exercise: 8-22 mins PT General Charges $$ ACUTE PT VISIT: 1 Visit                     Izetta Call, PT, DPT   Acute Rehabilitation Department Office [phone] Secure  Chat Communication Preferred   Izetta JULIANNA Call 04/26/2024, 11:26 AM

## 2024-04-27 ENCOUNTER — Other Ambulatory Visit: Payer: Self-pay

## 2024-04-27 DIAGNOSIS — K639 Disease of intestine, unspecified: Secondary | ICD-10-CM

## 2024-04-27 DIAGNOSIS — D649 Anemia, unspecified: Secondary | ICD-10-CM | POA: Diagnosis not present

## 2024-04-27 DIAGNOSIS — K3182 Dieulafoy lesion (hemorrhagic) of stomach and duodenum: Secondary | ICD-10-CM

## 2024-04-27 LAB — BASIC METABOLIC PANEL WITH GFR
Anion gap: 6 (ref 5–15)
BUN: 8 mg/dL (ref 8–23)
CO2: 27 mmol/L (ref 22–32)
Calcium: 8.4 mg/dL — ABNORMAL LOW (ref 8.9–10.3)
Chloride: 109 mmol/L (ref 98–111)
Creatinine, Ser: 0.69 mg/dL (ref 0.44–1.00)
GFR, Estimated: >60 mL/min
Glucose, Bld: 120 mg/dL — ABNORMAL HIGH (ref 70–99)
Potassium: 4.2 mmol/L (ref 3.5–5.1)
Sodium: 143 mmol/L (ref 135–145)

## 2024-04-27 LAB — TRIGLYCERIDES: Triglycerides: 134 mg/dL

## 2024-04-27 LAB — VANCOMYCIN, TROUGH: Vancomycin Tr: 7 ug/mL — ABNORMAL LOW (ref 15–20)

## 2024-04-27 LAB — HEMOGLOBIN AND HEMATOCRIT, BLOOD
HCT: 23.1 % — ABNORMAL LOW (ref 36.0–46.0)
HCT: 24.1 % — ABNORMAL LOW (ref 36.0–46.0)
HCT: 23.5 % — ABNORMAL LOW (ref 36.0–46.0)
Hemoglobin: 7 g/dL — ABNORMAL LOW (ref 12.0–15.0)
Hemoglobin: 7.3 g/dL — ABNORMAL LOW (ref 12.0–15.0)
Hemoglobin: 7.1 g/dL — ABNORMAL LOW (ref 12.0–15.0)

## 2024-04-27 LAB — GLUCOSE, CAPILLARY: Glucose-Capillary: 109 mg/dL — ABNORMAL HIGH (ref 70–99)

## 2024-04-27 MED ORDER — AMINOCAPROIC ACID 500 MG PO TABS
1.0000 g | ORAL_TABLET | Freq: Three times a day (TID) | ORAL | Status: DC
  Administered 2024-04-27 – 2024-05-13 (×49): 1 g via ORAL
  Filled 2024-04-27 (×56): qty 2

## 2024-04-27 MED ORDER — VANCOMYCIN HCL 2000 MG/400ML IV SOLN
2000.0000 mg | INTRAVENOUS | Status: DC
  Administered 2024-04-27 – 2024-04-28 (×2): 2000 mg via INTRAVENOUS
  Filled 2024-04-27 (×2): qty 400

## 2024-04-27 MED ORDER — SODIUM CHLORIDE 0.9 % IV SOLN
250.0000 mg | Freq: Once | INTRAVENOUS | Status: AC
  Administered 2024-04-27: 250 mg via INTRAVENOUS
  Filled 2024-04-27: qty 20

## 2024-04-27 MED ORDER — AMOXICILLIN 500 MG PO CAPS
500.0000 mg | ORAL_CAPSULE | Freq: Two times a day (BID) | ORAL | Status: AC
  Administered 2024-04-27 – 2024-05-01 (×10): 500 mg via ORAL
  Filled 2024-04-27 (×11): qty 1

## 2024-04-27 NOTE — Plan of Care (Signed)
" °  Problem: Education: Goal: Knowledge of General Education information will improve Description: Including pain rating scale, medication(s)/side effects and non-pharmacologic comfort measures Outcome: Progressing   Problem: Health Behavior/Discharge Planning: Goal: Ability to manage health-related needs will improve Outcome: Progressing   Problem: Clinical Measurements: Goal: Ability to maintain clinical measurements within normal limits will improve Outcome: Progressing Goal: Will remain free from infection Outcome: Progressing Goal: Diagnostic test results will improve Outcome: Progressing Goal: Respiratory complications will improve Outcome: Progressing Goal: Cardiovascular complication will be avoided Outcome: Progressing   Problem: Nutrition: Goal: Adequate nutrition will be maintained Outcome: Progressing   Problem: Coping: Goal: Level of anxiety will decrease Outcome: Progressing   Problem: Elimination: Goal: Will not experience complications related to urinary retention Outcome: Progressing   Problem: Pain Managment: Goal: General experience of comfort will improve and/or be controlled Outcome: Progressing   Problem: Safety: Goal: Ability to remain free from injury will improve Outcome: Progressing   Problem: Skin Integrity: Goal: Risk for impaired skin integrity will decrease Outcome: Progressing   Problem: Respiratory: Goal: Ability to maintain a clear airway and adequate ventilation will improve Outcome: Progressing   Problem: Role Relationship: Goal: Method of communication will improve Outcome: Progressing   "

## 2024-04-27 NOTE — Progress Notes (Addendum)
 Peggy House   DOB:[birth year]   FM#:[id number]      CLINICAL SUMMARY:  Peggy House is a 64 year old female patient admitted 04/22/2024 with complaints of generalized weakness, fatigue and dizziness.  Reported nosebleed 3 days prior with episode of black stool.  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  Medical hematology following closely.  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  Required multiple transfusions and APC's since 2019. - Extensive workup confirmed HHT based on personal and family history. - Status post frequent blood transfusions, IV iron  and bevacizumab  with good response.  However patient has been noncompliant with outpatient appointments.  ASSESSMENT & PLAN:  HHT (hereditary hemorrhagic telangiectasia), severe recurrent GI bleeding and epistaxis - Patient admitted 04/22/2024 with complaints of weakness, fatigue and dizziness. - Rapid response event on 04/26/2024 due to large amount of nosebleed.  ENT managed and currently following. - Plan bevacizumab  today or tomorrow. - Also consideration for transfer to [facility] if needed for surgical intervention. - Medical hematology/Dr. Lanny following closely and will make further recommendations.  Anemia, symptomatic Iron  deficiency anemia - Related to chronic epistaxis and GI bleeding.  Patient reported nosebleed with black stools 3 days prior to admission. - FOBT + x 2 - Hemoglobin low 7.3 today - Continue IV iron , will order another dose - Recommend PRBC transfusion for hemoglobin <7.3 - Continue to monitor CBC with differential - GI following  RLE chronic wound - Status post ORIF - ID following  Code Status Full  Subjective:  Patient seen awake and alert laying in bed.  Reports that she is okay.  Dressing intact in nares due to epistaxis.  Dressing right ankle dry and  intact.  No acute complaints offered.  Objective:   Intake/Output Summary (Last 24 hours) at 04/27/2024 1337 Last data filed at 04/27/2024 1100 Gross per 24 hour  Intake 120 ml  Output 2500 ml  Net -2380 ml     PHYSICAL EXAMINATION: ECOG PERFORMANCE STATUS: 4 - Bedbound  Vitals:   04/27/24 1100 04/27/24 1133  BP: (!) 163/67   Pulse: 73 70  Resp: 20 14  Temp:  98.8 F (37.1 C)  SpO2: 99% 99%   Filed Weights   04/23/24 0500 04/24/24 0500 04/25/24 0500  Weight: 227 lb 15.3 oz (103.4 kg) 241 lb 6.5 oz (109.5 kg) 238 lb 8.6 oz (108.2 kg)    GENERAL: alert, no distress and comfortable SKIN: skin color, texture, turgor are normal, no rashes or significant lesions EYES: normal, conjunctiva are pink and non-injected, sclera clear OROPHARYNX: + Bilateral nares with dressing intact NECK: supple, thyroid normal size, non-tender, without nodularity LYMPH: no palpable lymphadenopathy in the cervical, axillary or inguinal LUNGS: clear to auscultation and percussion with normal breathing effort HEART: regular rate & rhythm and no murmurs and no lower extremity edema ABDOMEN: abdomen soft, non-tender and normal bowel sounds MUSCULOSKELETAL: + Right ankle dressing dry and intact PSYCH: alert & oriented x 3 with fluent speech NEURO: no focal motor/sensory deficits   All questions were answered. The patient knows to call the clinic with any problems, questions or concerns.   I personally spent a total of 40 minutes minutes in the care of the patient today including preparing to see the patient, getting/reviewing separately obtained history, performing a medically appropriate exam/evaluation, referring and communicating with other health care professionals, documenting clinical information in the EHR,  communicating results, and coordinating care.    Olam PARAS Rouson, NP 04/27/2024 1:37 PM    Labs Reviewed:  Lab Results  Component Value Date   WBC 10.3 04/26/2024   HGB 7.3 (L)  04/27/2024   HCT 24.1 (L) 04/27/2024   MCV 89.2 04/26/2024   PLT 332 04/26/2024   Recent Labs    08/20/23 0000 12/24/23 0800 02/18/24 1300 03/04/24 1440 03/05/24 0452 04/21/24 1532 04/22/24 1326 04/23/24 0655 04/25/24 0424 04/26/24 0349 04/27/24 0444  NA  --    < > 142   < > 139 141 143   < > 139 142 143  K  --    < > 3.7   < > 3.9 3.9 3.8   < > 3.5 4.1 4.2  CL  --    < > 110   < > 109 110 110   < > 109 110 109  CO2  --    < > 25   < > 21* 23 24   < > 23 23 27   GLUCOSE  --    < > 92   < > 86 89 147*   < > 90 125* 120*  BUN  --    < > 7*   < > 21 9 9    < > <5* 7* 8  CREATININE  --    < > 0.87   < > 0.73 0.86 0.81   < > 0.69 0.73 0.69  CALCIUM   --    < > 9.1   < > 8.2* 8.4* 8.7*   < > 7.9* 8.4* 8.4*  GFRNONAA  --    < > >60   < > >60  --  >60   < > >60 >60 >60  GFRAA 98  --   --   --   --   --   --   --   --   --   --   PROT  --    < > 7.0  --  5.5* 6.1 6.2*  --   --   --   --   ALBUMIN  --    < > 3.9  --  2.7*  --  3.8  --   --   --   --   AST  --    < > 10*  --  11* 9* 11*  --   --   --   --   ALT  --    < > <5  --  7 6 5   --   --   --   --   ALKPHOS  --    < > 93  --  61  --  92  --   --   --   --   BILITOT  --    < > 0.3  --  0.7 0.2 <0.2  --   --   --   --    < > = values in this interval not displayed.    Studies Reviewed:   ECHOCARDIOGRAM COMPLETE Result Date: 04/25/2024    ECHOCARDIOGRAM REPORT   Patient Name:   Peggy House Date of Exam: 04/25/2024 Medical Rec #:  [id number]              Height:       64.0 in Accession #:    [id number]             Weight:  238.5 lb Date of Birth:  16-Dec-1960              BSA:          2.109 m Patient Age:    63 years               BP:           136/63 mmHg Patient Gender: F                      HR:           119 bpm. Exam Location:  Inpatient Procedure: 2D Echo, Cardiac Doppler, Color Doppler and Strain Analysis (Both            Spectral and Color Flow Doppler were utilized during procedure). Indications:    R94.31 Abnormal  EKG  History:        Patient has prior history of Echocardiogram examinations, most                 recent 04/14/2023. Signs/Symptoms:Syncope,                 Dizziness/Lightheadedness and Fatigue; Risk Factors:Diabetes,                 Hypertension, Dyslipidemia and Current Smoker. Port.  Sonographer:    Ellouise Mose RDCS Referring Phys: [id number] [facility]  Sonographer Comments: Patient is obese. Image acquisition challenging due to patient body habitus. IMPRESSIONS  1. Left ventricular ejection fraction, by estimation, is 65 to 70%. Left ventricular ejection fraction by 2D MOD biplane is 69.5 %. The left ventricle has normal function. The left ventricle has no regional wall motion abnormalities. There is severe left ventricular hypertrophy. Left ventricular diastolic parameters are consistent with Grade II diastolic dysfunction (pseudonormalization). Elevated left atrial pressure. The average left ventricular global longitudinal strain is -24.3 %. The global longitudinal strain is normal.  2. Right ventricular systolic function is normal. The right ventricular size is moderately enlarged. There is severely elevated pulmonary artery systolic pressure. The estimated right ventricular systolic pressure is 60.2 mmHg.  3. Left atrial size was moderately dilated.  4. Right atrial size was moderately dilated.  5. The mitral valve is normal in structure. Trivial mitral valve regurgitation. No evidence of mitral stenosis.  6. Tricuspid valve regurgitation is mild to moderate.  7. There is a calcified nodule on the left coronary cusp (#32) and mild to moderate AS with PV 3.1 m/sec, MG 19 mmhg and DVI 0.46. There is evidence of high flow state with LVOT VTI 29 cm. The aortic valve is tricuspid. Aortic valve regurgitation is trivial. Mild to moderate aortic valve stenosis.  8. There is Moderate (Grade III) atheroma plaque involving the aortic arch.  9. There is late systolic flow reversal of hepatic vein suggestive  noncompliant RV. The inferior vena cava is dilated in size with <50% respiratory variability, suggesting right atrial pressure of 15 mmHg. Comparison(s): Prior images reviewed side by side. EF is similar to prior. There is now evidence of pulmonary HTN with RVSP 60, dilated RV/RA, and new aortic stenosis. FINDINGS  Left Ventricle: Left ventricular ejection fraction, by estimation, is 65 to 70%. Left ventricular ejection fraction by 2D MOD biplane is 69.5 %. The left ventricle has normal function. The left ventricle has no regional wall motion abnormalities. The average left ventricular global longitudinal strain is -24.3 %. Strain was performed and the global longitudinal strain is normal. The left ventricular internal  cavity size was normal in size. There is severe left ventricular hypertrophy. Left ventricular diastolic parameters are consistent with Grade II diastolic dysfunction (pseudonormalization). Elevated left atrial pressure. Right Ventricle: The right ventricular size is moderately enlarged. No increase in right ventricular wall thickness. Right ventricular systolic function is normal. There is severely elevated pulmonary artery systolic pressure. The tricuspid regurgitant velocity is 3.36 m/s, and with an assumed right atrial pressure of 15 mmHg, the estimated right ventricular systolic pressure is 60.2 mmHg. Left Atrium: Left atrial size was moderately dilated. Right Atrium: Right atrial size was moderately dilated. Pericardium: There is no evidence of pericardial effusion. Mitral Valve: The mitral valve is normal in structure. Trivial mitral valve regurgitation. No evidence of mitral valve stenosis. Tricuspid Valve: The tricuspid valve is normal in structure. Tricuspid valve regurgitation is mild to moderate. No evidence of tricuspid stenosis. Aortic Valve: There is a calcified nodule on the left coronary cusp (#32) and mild to moderate AS with PV 3.1 m/sec, MG 19 mmhg and DVI 0.46. There is evidence  of high flow state with LVOT VTI 29 cm. The aortic valve is tricuspid. Aortic valve regurgitation is trivial. Mild to moderate aortic stenosis is present. Aortic valve mean gradient measures 19.0 mmHg. Aortic valve peak gradient measures 39.4 mmHg. Aortic valve area, by VTI measures 1.45 cm. Pulmonic Valve: The pulmonic valve was normal in structure. Pulmonic valve regurgitation is not visualized. No evidence of pulmonic stenosis. Aorta: The aortic root and ascending aorta are structurally normal, with no evidence of dilitation. There is moderate (Grade III) atheroma plaque involving the aortic arch. Venous: A normal flow pattern is recorded from the right upper pulmonary vein. There is late systolic flow reversal of hepatic vein suggestive noncompliant RV. The inferior vena cava is dilated in size with less than 50% respiratory variability, suggesting right atrial pressure of 15 mmHg. IAS/Shunts: No atrial level shunt detected by color flow Doppler.  LEFT VENTRICLE PLAX 2D                        Biplane EF (MOD) LVIDd:         5.40 cm         LV Biplane EF:   Left LVIDs:         3.00 cm                          ventricular LV PW:         1.30 cm                          ejection LV IVS:        1.10 cm                          fraction by LVOT diam:     2.00 cm                          2D MOD LV SV:         91                               biplane is LV SV Index:   43  69.5 %. LVOT Area:     3.14 cm LV IVRT:       85 msec         Diastology                                LV e' medial:    10.80 cm/s                                LV E/e' medial:  14.6 LV Volumes (MOD)               LV e' lateral:   11.20 cm/s LV vol d, MOD    150.0 ml      LV E/e' lateral: 14.1 A2C: LV vol d, MOD    147.0 ml      2D Longitudinal A4C:                           Strain LV vol s, MOD    50.4 ml       2D Strain GLS   -26.4 % A2C:                           (A4C): LV vol s, MOD    39.2 ml       2D Strain GLS    -23.8 % A4C:                           (A3C): LV SV MOD A2C:   99.6 ml       2D Strain GLS   -22.7 % LV SV MOD A4C:   147.0 ml      (A2C): LV SV MOD BP:    103.3 ml      2D Strain GLS   -24.3 %                                Avg: RIGHT VENTRICLE             IVC RV S prime:     14.40 cm/s  IVC diam: 2.90 cm TAPSE (M-mode): 2.9 cm                             PULMONARY VEINS                             Diastolic Velocity: 42.50 cm/s                             S/D Velocity:       1.40                             Systolic Velocity:  60.00 cm/s LEFT ATRIUM             Index        RIGHT ATRIUM           Index LA diam:        4.00 cm 1.90 cm/m   RA Area:  26.10 cm LA Vol (A2C):   95.1 ml 45.10 ml/m  RA Volume:   85.40 ml  40.50 ml/m LA Vol (A4C):   75.0 ml 35.57 ml/m LA Biplane Vol: 88.9 ml 42.16 ml/m  AORTIC VALVE AV Area (Vmax):    1.31 cm AV Area (Vmean):   1.49 cm AV Area (VTI):     1.45 cm AV Vmax:           314.00 cm/s AV Vmean:          187.750 cm/s AV VTI:            0.631 m AV Peak Grad:      39.4 mmHg AV Mean Grad:      19.0 mmHg LVOT Vmax:         131.00 cm/s LVOT Vmean:        89.300 cm/s LVOT VTI:          0.291 m LVOT/AV VTI ratio: 0.46  AORTA Ao Root diam: 3.30 cm Ao Asc diam:  3.70 cm MITRAL VALVE                TRICUSPID VALVE MV Area (PHT): 2.76 cm     TR Peak grad:   45.2 mmHg MV Decel Time: 275 msec     TR Vmax:        336.00 cm/s MV E velocity: 158.00 cm/s MV A velocity: 123.00 cm/s  SHUNTS MV E/A ratio:  1.28         Systemic VTI:  0.29 m                             Systemic Diam: 2.00 cm Joelle Azobou Tonleu Electronically signed by Joelle Cedars Tonleu Signature Date/Time: 04/25/2024/3:02:27 PM    Final    DG CHEST PORT 1 VIEW Result Date: 04/23/2024 CLINICAL DATA:  Endotracheal intubation EXAM: PORTABLE CHEST 1 VIEW COMPARISON:  April 22, 2024 FINDINGS: Stable cardiomegaly. Endotracheal tube is noted distal tip 1.5 cm above the carina. Right internal jugular Port-A-Cath is  unchanged. Right lung is clear. Mild left retrocardiac opacity is noted concerning for possible atelectasis or infiltrate and associated effusion. IMPRESSION: Endotracheal tube is noted with distal tip 1.5 cm above the carina. Mild left retrocardiac opacity is noted concerning for possible atelectasis or infiltrate and associated effusion. Electronically Signed   By: Lynwood Landy Raddle M.D.   On: 04/23/2024 15:32   US  EKG SITE RITE Result Date: 04/23/2024 If Site Rite image not attached, placement could not be confirmed due to current cardiac rhythm.  DG Chest 2 View Result Date: 04/22/2024 CLINICAL DATA:  Shortness of breath EXAM: CHEST - 2 VIEW COMPARISON:  March 04, 2024 FINDINGS: Stable cardiomegaly. Right internal jugular port is unchanged. Both lungs are clear. The visualized skeletal structures are unremarkable. IMPRESSION: No active cardiopulmonary disease. Electronically Signed   By: Lynwood Landy Raddle M.D.   On: 04/22/2024 14:08    Addendum I have seen the patient, examined her. I agree with the assessment and and plan and have edited the notes.   Patient is well-known to me, she has been on bevacizumab  for HHT related bleeding.  It has been held recently due to her open wound in foot.  Due to the severe epistaxis and anemia, I think the benefit of bevacizumab  is outweighed the risk of side effect, plan to restart in the hospital tomorrow.  Her ferritin was also low last week, will  restart IV ferric gluconate in the hospital, plan to give 2-3 doses in a week.  I also restarted her Amicar  orally, she received tranexamic acid  yesterday for acute bleeding, okay to repeat if needed. Will f/u up in hospital.  Treatment plan discussed with patient, and inpatient chemo team.  I spent a total of 50 minutes for her care today, including care coordination.  [name] MD 04/27/2024

## 2024-04-27 NOTE — Telephone Encounter (Signed)
 Order entered for ct entero. Rad scheduling to contact pt to set up the scan. Left detailed message on pts voicemail regarding the ct entero. Also left phone number pt can call to set up scan if wanted:  [phone].

## 2024-04-27 NOTE — Progress Notes (Addendum)
 Pharmacy Antibiotic Note  Peggy House is a 64 y.o. female admitted on 04/22/2024 with a chief complaint of generalized weakness/fatigue/dizziness, nosebleed, and episode of black stool.Pharmacy has been consulted for vancomycin  dosing. Infectious workup is for osteomyelitis. Plan for 6 weeks IV vancomycin  - stop date 2/20.  Vanc peak (drawn late) 18 mcg/ml Vanc trough 7 mcg/ml Calculate ke 0.0579, t1/2 ~12h  Plan: Increase Vancomycin  to 2000mg  IV daily (calculated AUC 450, Cmax 32.8, Cmin 9.2) **OPAT orders updated** Monitor renal function Consider levels at Css on new dose   Height: 5' 4 (162.6 cm) Weight: 108.2 kg (238 lb 8.6 oz) IBW/kg (Calculated) : 54.7  Temp (24hrs), Avg:98.7 F (37.1 C), Min:98.6 F (37 C), Max:98.8 F (37.1 C)  Recent Labs  Lab 04/23/24 1506 04/23/24 1754 04/24/24 0506 04/25/24 0424 04/26/24 0349 04/26/24 2244 04/27/24 0444 04/27/24 1502  WBC 11.0* 11.7* 14.4* 11.3* 10.3  --   --   --   CREATININE  --  0.83 0.69 0.69 0.73  --  0.69  --   VANCOTROUGH  --   --   --   --   --   --   --  7*  VANCOPEAK  --   --   --   --   --  18*  --   --     Estimated Creatinine Clearance: 86.5 mL/min (by C-G formula based on SCr of 0.69 mg/dL).    Allergies[1]  Antimicrobials this admission: Linezolid  1/8>>1/9 Vancomycin  1/9 >> [06/04/2024 per ID]  Microbiology results: 03/16/24 surgical culture from right leg wound = Corynebacterium striatum  Thank you for allowing pharmacy to be a part of this patients care.  Vito Ralph, PharmD, BCPS Please see amion for complete clinical pharmacist phone list 04/27/2024 4:40 PM      [1]  Allergies Allergen Reactions   Feraheme  [Ferumoxytol ] Other (See Comments)    Muscle tetany, encephalopathy, fatigue, nausea (reaction to injection)   Nsaids Other (See Comments)    Stomach bleeding episodes; Tylenol  is OK   Wasp Venom Anaphylaxis   Tomato Hives   Iron  (Ferrous Sulfate ) [Ferrous Sulfate  Er] Other  (See Comments)    Shock

## 2024-04-27 NOTE — Telephone Encounter (Signed)
-----   Message from Glendia Holt, MD sent at 04/24/2024 11:33 PM EST ----- Regarding: CT enterography [place] please order routine  CT enterography on this patient to further evaluate suspected nodule seen on capsule endoscopy.

## 2024-04-27 NOTE — Progress Notes (Signed)
 PHARMACY CONSULT NOTE FOR:  OUTPATIENT  PARENTERAL ANTIBIOTIC THERAPY (OPAT)  Indication: Osteomyelitis Regimen: Vancomycin  IV 2000 mg every 24 hours - dose updated 1/13 (adjusted as needed based on renal function and vancomycin  levels with goal AUC 400-600) End date: 06/04/2024  IV antibiotic discharge orders are pended. To discharging provider:  please sign these orders via discharge navigator,  Select New Orders & click on the button choice - Manage This Unsigned Work.    Thank you for allowing pharmacy to be a part of this patient's care.  Vito Ralph, PharmD, BCPS Please see amion for complete clinical pharmacist phone list

## 2024-04-27 NOTE — Progress Notes (Signed)
 "  NAME:  Peggy House, MRN:  [id number], DOB:  12/24/1960, LOS: 5 ADMISSION DATE:  04/22/2024, CONSULTATION DATE:  04/24/2023 REFERRING MD:  Dr. Cindy, CHIEF COMPLAINT:  epistaxis   History of Present Illness:  Peggy House is a 64 y/o woman with a history of HHT, anemia requiring blood transfusions, who presented with Hb 4.1 after labs came back with low hemoglobin following visit with ID clinic. She complaints of DOE and fatigue over the past few days. She had dark tarry stools a few days prior to admission and several nosebleeds at home. She had epistaxis last night and again today during EGD. She had ~200cc blood suctioned out of orpharynx and gauze packed into her anterior nose during the EGD. She is not on chronic AC. She received 4 units of pRBC pre-EGD and another 1 units pRBC during the case. ENT was consulted and recommended using gauze with epi to stop the bleeding. She is transferring to the ICU post-EGD for airway protection until epistaxis is resolved.   Pertinent  Medical History  Per above  Significant Hospital Events: Including procedures, antibiotic start and stop dates in addition to other pertinent events   04/23/2023: Admitted for acute blood loss anemia and GIB 04/24/2023: epistaxis while in endoscopy, remained intubated for airway protection  1/12 repeat epistaxis, back to ICU, given TXA  Interim History / Subjective:  Epistaxis on the left, packing in place, no ongoing bleeding.  Objective    Blood pressure 138/63, pulse 73, temperature 98.6 F (37 C), temperature source Oral, resp. rate 19, height 5' 4 (1.626 m), weight 108.2 kg, SpO2 99%.        Intake/Output Summary (Last 24 hours) at 04/27/2024 0950 Last data filed at 04/27/2024 0522 Gross per 24 hour  Intake --  Output 1350 ml  Net -1350 ml    Filed Weights   04/23/24 0500 04/24/24 0500 04/25/24 0500  Weight: 103.4 kg 109.5 kg 108.2 kg    Examination: General: chronically ill appearing woman  lying in bed in NAD HENT: [place]/AT, eyes anicteric. Left nare packed-- no bleeding around it. Lungs: breathing comfortably on RA, CTAB, no conversational dyspnea Cardiovascular: S1S2, RRR Abdomen: obese, soft, NT Extremities: no cyanosis or edema Neuro: awake, alert, moving all extremities  BUN 8 Cr 0.69 H/H 7/23.1    Resolved problem list  Endotracheally intubated for airway protection  Assessment and Plan   Severe epistaxis; aspirated blood during rapid onset epistaxis while under anesthesia; previously intubated -not requiring supplemental O2 -d/w Heme-- will try to get approval for avastin  -new packing dissolvable per ENT; ok to leave in -adding back empiric antibiotics while packing is in -If more epistaxis, give IV TXA + topical TXA and afrin. Could use topical epinephrine .   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   Labs   CBC: Recent Labs  Lab 04/21/24 1532 04/22/24 1326 04/22/24 2106 04/23/24 1506 04/23/24 1510 04/23/24 1754 04/23/24 2322 04/24/24 0506 04/24/24 1233 04/24/24 2315 04/25/24 0424 04/26/24 0349 04/26/24 2244 04/27/24 0444  WBC 11.8* 10.6*   < > 11.0*  --  11.7*  --  14.4*  --   --  11.3* 10.3  --   --    NEUTROABS 10,018* 9.1*  --   --   --   --   --   --   --   --   --   --   --   --   HGB 4.1* 4.5*   < > 8.3*   < > 8.5*   < > 7.8*   < > 7.6* 7.4* 8.0* 7.7* 7.0*  HCT 15.0* 16.1*   < > 26.0*   < > 27.7*   < > 25.1*   < > 24.7* 24.1* 26.3* 25.4* 23.1*  MCV 86.7 89.0   < > 86.1  --  89.6  --  88.7  --   --  88.6 89.2  --   --   PLT 354 371   < > 293  --  265  --  273  --   --  251 332  --   --    < > = values in this interval not displayed.    Basic Metabolic Panel: Recent Labs  Lab 04/22/24 1442 04/23/24 0655 04/23/24 1754 04/24/24 0506 04/25/24 0424 04/26/24 0349 04/27/24 0444  NA  --    < > 140 140 139 142 143  K  --    < > 4.4 3.5 3.5 4.1 4.2  CL  --    < > 113* 110 109 110 109  CO2  --    < > 16* 23 23 23 27   GLUCOSE  --    < > 92 139* 90 125* 120*  BUN  --    < > 8 8 <5* 7* 8  CREATININE  --    < > 0.83 0.69 0.69 0.73 0.69  CALCIUM   --    < > 8.2* 7.9* 7.9* 8.4* 8.4*  MG 2.1  --   --   --   --   --   --   PHOS 2.9  --   --   --   --   --   --    < > = values in this interval not displayed.   GFR: Estimated Creatinine Clearance: 86.5 mL/min (by C-G formula based on SCr of 0.69 mg/dL). Recent Labs  Lab 04/23/24 1754 04/24/24 0506 04/25/24 0424 04/26/24 0349  WBC 11.7* 14.4* 11.3* 10.3    Critical care time:       [name], DO 04/27/2024 10:05 AM [facility]  For contact information, see Amion. If no response to pager, please call PCCM 2H APP. After hours, 7PM- 7AM, please call on call APP for 2H.             "

## 2024-04-28 LAB — CBC
HCT: 25.7 % — ABNORMAL LOW (ref 36.0–46.0)
Hemoglobin: 7.6 g/dL — ABNORMAL LOW (ref 12.0–15.0)
MCH: 26 pg (ref 26.0–34.0)
MCHC: 29.6 g/dL — ABNORMAL LOW (ref 30.0–36.0)
MCV: 88 fL (ref 80.0–100.0)
Platelets: 347 K/uL (ref 150–400)
RBC: 2.92 MIL/uL — ABNORMAL LOW (ref 3.87–5.11)
RDW: 17.5 % — ABNORMAL HIGH (ref 11.5–15.5)
WBC: 11.9 K/uL — ABNORMAL HIGH (ref 4.0–10.5)
nRBC: 0.3 % — ABNORMAL HIGH (ref 0.0–0.2)

## 2024-04-28 LAB — HEMOGLOBIN AND HEMATOCRIT, BLOOD
HCT: 21.7 % — ABNORMAL LOW (ref 36.0–46.0)
Hemoglobin: 6.5 g/dL — CL (ref 12.0–15.0)

## 2024-04-28 LAB — PREPARE RBC (CROSSMATCH)

## 2024-04-28 MED ORDER — SODIUM CHLORIDE 0.9 % IV SOLN: Freq: Once | INTRAVENOUS | Status: AC

## 2024-04-28 MED ORDER — SODIUM CHLORIDE 0.9% IV SOLUTION: Freq: Once | INTRAVENOUS | Status: AC

## 2024-04-28 MED ORDER — SODIUM CHLORIDE 0.9 % IV SOLN
5.0000 mg/kg | Freq: Once | INTRAVENOUS | Status: AC
  Administered 2024-04-28: 500 mg via INTRAVENOUS
  Filled 2024-04-28: qty 4
  Filled 2024-04-28 (×2): qty 20

## 2024-04-28 MED ORDER — VANCOMYCIN HCL 2000 MG/400ML IV SOLN
2000.0000 mg | INTRAVENOUS | Status: DC
  Administered 2024-04-28 – 2024-04-30 (×3): 2000 mg via INTRAVENOUS
  Filled 2024-04-28 (×4): qty 400

## 2024-04-28 NOTE — Progress Notes (Signed)
 Chemo RN presented to patient room. Patient verified using two separate identifiers. Role of Chemo RN explained to patient. Consent for administration of bevacizumab  obtained and placed in patient's chart. Patient tolerated infusion well, without issue. Primary RN aware.

## 2024-04-28 NOTE — Plan of Care (Signed)
  Problem: Education: Goal: Knowledge of General Education information will improve Description: Including pain rating scale, medication(s)/side effects and non-pharmacologic comfort measures Outcome: Progressing   Problem: Health Behavior/Discharge Planning: Goal: Ability to manage health-related needs will improve Outcome: Progressing   Problem: Clinical Measurements: Goal: Ability to maintain clinical measurements within normal limits will improve Outcome: Progressing Goal: Will remain free from infection Outcome: Progressing Goal: Diagnostic test results will improve Outcome: Progressing Goal: Respiratory complications will improve Outcome: Progressing Goal: Cardiovascular complication will be avoided Outcome: Progressing   Problem: Activity: Goal: Risk for activity intolerance will decrease Outcome: Progressing   Problem: Nutrition: Goal: Adequate nutrition will be maintained Outcome: Progressing   Problem: Coping: Goal: Level of anxiety will decrease Outcome: Progressing   Problem: Elimination: Goal: Will not experience complications related to urinary retention Outcome: Progressing   Problem: Pain Managment: Goal: General experience of comfort will improve and/or be controlled Outcome: Progressing   Problem: Safety: Goal: Ability to remain free from injury will improve Outcome: Progressing   Problem: Skin Integrity: Goal: Risk for impaired skin integrity will decrease Outcome: Progressing   Problem: Activity: Goal: Ability to tolerate increased activity will improve Outcome: Progressing   Problem: Respiratory: Goal: Ability to maintain a clear airway and adequate ventilation will improve Outcome: Progressing   Problem: Role Relationship: Goal: Method of communication will improve Outcome: Progressing

## 2024-04-28 NOTE — Plan of Care (Signed)
" °  Problem: Nutrition: Goal: Adequate nutrition will be maintained Outcome: Progressing   Problem: Elimination: Goal: Will not experience complications related to urinary retention Outcome: Progressing   Problem: Safety: Goal: Ability to remain free from injury will improve Outcome: Progressing   Problem: Skin Integrity: Goal: Risk for impaired skin integrity will decrease Outcome: Progressing   Problem: Pain Managment: Goal: General experience of comfort will improve and/or be controlled Outcome: Progressing   "

## 2024-04-28 NOTE — Progress Notes (Signed)
 Ok to proceed w/bevacizumab  today without protein and with ANC=9.1 from 1/8 labs per Dr. Lanny.  Puja Caffey, PharmD, MBA

## 2024-04-28 NOTE — TOC Initial Note (Signed)
 Transition of Care [facility]) - Initial/Assessment Note    Patient Details  Name: Peggy House MRN: [id number] Date of Birth: 12-Jun-1960  [facility]) CM/SW Contact:    Marval Gell, RN Phone Number: 04/28/2024, 4:25 PM  Clinical Narrative:                  Beatris w patient to discuss needs for DC.  She states that she has all ambulatory DME needed, she lives at home w her son and daughter in law. She has assistance with transportation through [employer].  She has had HH with [facility] in the past and would like to use them again.  Reached out to [employer] and she will check into seeing if she can take another Medicaid, she will know more tomorrow.   Expected Discharge Plan: Home w Home Health Services Barriers to Discharge: Continued Medical Work up   Patient Goals and CMS Choice Patient states their goals for this hospitalization and ongoing recovery are:: to go home CMS Medicare.gov Compare Post Acute Care list provided to:: Patient Choice offered to / list presented to : Patient      Expected Discharge Plan and Services   Discharge Planning Services: CM Consult   Living arrangements for the past 2 months: Single Family Home                                      Prior Living Arrangements/Services Living arrangements for the past 2 months: Single Family Home Lives with:: Adult Children                   Activities of Daily Living   ADL Screening (condition at time of admission) Independently performs ADLs?: Yes (appropriate for developmental age) Is the patient deaf or have difficulty hearing?: No Does the patient have difficulty seeing, even when wearing glasses/contacts?: No Does the patient have difficulty concentrating, remembering, or making decisions?: No  Permission Sought/Granted                  Emotional Assessment              Admission diagnosis:  GIB (gastrointestinal bleeding) [K92.2] Symptomatic anemia  [D64.9] Gastrointestinal hemorrhage, unspecified gastrointestinal hemorrhage type [K92.2] Patient Active Problem List   Diagnosis Date Noted   Nodule of intestine 04/24/2024   On mechanically assisted ventilation (HCC) 04/23/2024   Acute respiratory failure with hypoxia (HCC) 04/23/2024   GIB (gastrointestinal bleeding) 04/22/2024   AVM (arteriovenous malformation) of small bowel, acquired 03/06/2024   Gastric ulcer without hemorrhage or perforation 03/06/2024   Gastric AVM 03/06/2024   Acute upper GI bleeding 03/04/2024   Epistaxis 12/27/2023   Generalized anxiety disorder 08/13/2023   Coping style affecting medical condition 08/13/2023   Closed trimalleolar fracture of right ankle 08/13/2023   Open wound of right ankle; discharged with wound vac 07/29/2023   Heart murmur, systolic 07/27/2023   Osteomyelitis (HCC) 07/17/2023   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 07/13/2023   Thrombocytopenia 04/15/2023   ABLA (acute blood loss anemia) 01/31/2023   Cholelithiasis 11/18/2022   Aortic atherosclerosis 11/18/2022   Tobacco use disorder 06/22/2022   Depression with anxiety 04/12/2022   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   Syncope 11/01/2020   Leg pain, bilateral 09/28/2019   Urge incontinence 09/28/2019   Pain due to onychomycosis of toenails of both feet  02/26/2019   Diabetes mellitus (HCC) 02/26/2019   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   Symptomatic anemia 12/07/2018   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   Acute blood loss anemia 12/03/2018   Port-A-Cath in place 09/18/2018   GAD (generalized anxiety disorder) 01/20/2018   Pulmonary artery hypertension (HCC) 10/19/2017   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   Venous stasis dermatitis of both lower extremities 05/07/2017   Anemia due to acute blood loss secondary to Epistaxis 11/29/2016   Iron  deficiency anemia 10/29/2016    Osteoarthritis, multiple sites 06/05/2015   Insomnia 06/05/2015   Major depressive disorder, recurrent episode 06/05/2015   Obesity, Class III, BMI 40-49.9 (morbid obesity) (HCC) 04/26/2014   HLD (hyperlipidemia) 04/25/2014   Essential hypertension    PCP:  Nooruddin, Saad, MD Pharmacy:   [facility] [phone] - [place], [place] - [address] AT [facility] OF [address] [facility] [address] [place] [address] Phone: [phone] Fax: [phone]  [place] [facility] [address] [place] [address] Phone: [phone] Fax: [phone]  [name] [address] [place] [address] Phone: [phone] Fax: [phone]     Social Drivers of Health (SDOH) Social History: SDOH Screenings   Food Insecurity: No Food Insecurity (04/22/2024)  Housing: Low Risk (04/22/2024)  Transportation Needs: No Transportation Needs (04/22/2024)  Utilities: Not At Risk (04/22/2024)  Depression (PHQ2-9): Low Risk (03/20/2024)  Tobacco Use: High Risk (04/23/2024)   SDOH Interventions:     Readmission Risk Interventions    04/18/2023    3:07 PM 04/15/2023    2:22 PM  Readmission Risk Prevention Plan  Transportation Screening Complete Complete  PCP or Specialist Appt within 5-7 Days  Complete  PCP or Specialist Appt within 3-5 Days Complete   Home Care Screening  Complete  Medication Review (RN CM)  Complete  HRI or Home Care Consult Complete   Social Work Consult for Recovery Care Planning/Counseling Complete   Palliative Care Screening Complete   Medication Review Ocean[employer]) Complete

## 2024-04-28 NOTE — Progress Notes (Signed)
 Labs reviewed. Hemoglobin 6.5. No further epistaxis with packing in place. Will give 1 PRBC.   Rexene LOISE Blush, PA-C 04/28/2024 6:31 AM [facility]  For contact information, see Amion.  After hours, 7PM- 7AM, please call on call APP for 2H.

## 2024-04-28 NOTE — Progress Notes (Signed)
 Patient coughed and had an episode of epistaxis from right nare (packing still intact in left nare). Pressure held on nasal bridge, and Afrin applied to right nare. Bleeding subsided quickly after only about a minute. Pt subsequently coughed up bloody secretions, which were suctioned, while sitting in the chair. Dr Gretta made aware of new epistaxis.

## 2024-04-28 NOTE — Progress Notes (Signed)
 "  NAME:  Peggy House, MRN:  [id number], DOB:  Jul 01, 1960, LOS: 6 ADMISSION DATE:  04/22/2024, CONSULTATION DATE:  04/24/2023 REFERRING MD:  Dr. Cindy, CHIEF COMPLAINT:  epistaxis   History of Present Illness:  [name] is a 64 y/o woman with a history of HHT, anemia requiring blood transfusions, who presented with Hb 4.1 after labs came back with low hemoglobin following visit with ID clinic. She complaints of DOE and fatigue over the past few days. She had dark tarry stools a few days prior to admission and several nosebleeds at home. She had epistaxis last night and again today during EGD. She had ~200cc blood suctioned out of orpharynx and gauze packed into her anterior nose during the EGD. She is not on chronic AC. She received 4 units of pRBC pre-EGD and another 1 units pRBC during the case. ENT was consulted and recommended using gauze with epi to stop the bleeding. She is transferring to the ICU post-EGD for airway protection until epistaxis is resolved.   Pertinent  Medical History  Per above  Significant Hospital Events: Including procedures, antibiotic start and stop dates in addition to other pertinent events   04/23/2023: Admitted for acute blood loss anemia and GIB 04/24/2023: epistaxis while in endoscopy, remained intubated for airway protection  1/12 repeat epistaxis, back to ICU, given TXA  Interim History / Subjective:  No epistaxis overnight, small episode on R (non-packed side) that stopped quickly today. No other complaints.  Objective    Blood pressure 136/65, pulse 64, temperature 98.7 F (37.1 C), temperature source Oral, resp. rate 15, height 5' 4 (1.626 m), weight 110.7 kg, SpO2 99%.        Intake/Output Summary (Last 24 hours) at 04/28/2024 2152 Last data filed at 04/28/2024 2100 Gross per 24 hour  Intake 1141.32 ml  Output 1250 ml  Net -108.68 ml    Filed Weights   04/24/24 0500 04/25/24 0500 04/28/24 0500  Weight: 109.5 kg 108.2 kg 110.7 kg     Examination: General: chronically ill appearing woman lying in bed in NAD HENT: nasal packing on the left, no active bleeding Lungs: breathing comfortably on RA, cTAB Cardiovascular: S1S2, RRR Abdomen: obese, soft, NT Extremities: no edema, no cyanosis Neuro: awake alert no deficts  H/h 6.5/21.7     Resolved problem list  Endotracheally intubated for airway protection  Assessment and Plan   Severe epistaxis; aspirated blood during rapid onset epistaxis while under anesthesia; previously intubated -avastin  today -redose IV txa if signifciant rebleed -leaving packing in-- dissolvable -empiric antibiotics for TSS prophylaxis -recheck CBC tonight; transfuse for hb <7 -follow up in Peggy House with ENT specialist for HHT  Hereditary hemorrhagic telangiectasias-- off VEG-F inhibitor due to healing issues after her ankle surgery GIB, likely due to angioectasias - on EGD multiple non-bleeding angioectasias treated with APC and clipping -- EGD did not explain the degree of anemia she had at presentation Epistaxis is acute on chronic Acute blood loss anemia on chronic anemia in the setting of above and epistaxis  -PPI & carafate  -1 unit pRBC this morning -recheclk CBC tonight  History of HTN -amlodipine   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 and I&D  -vanc -mobilze as able -oxycodone   History of anxiety/depression -con't PTA celexa , xanax , and gabapentin   Con't to monitor in ICU given high risk for rebleeding   Labs   CBC: Recent Labs  Lab 04/22/24 1326 04/22/24 2106 04/23/24 1506 04/23/24 1510 04/23/24 1754 04/23/24 2322 04/24/24  9493 04/24/24 1233 04/25/24 0424 04/26/24 0349 04/26/24 2244 04/27/24 0444 04/27/24 1244 04/27/24 1706 04/28/24 0524  WBC 10.6*   < > 11.0*  --  11.7*  --  14.4*  --  11.3* 10.3  --   --   --   --   --   NEUTROABS 9.1*  --   --   --   --   --   --   --   --   --   --   --   --   --   --   HGB 4.5*   < >  8.3*   < > 8.5*   < > 7.8*   < > 7.4* 8.0* 7.7* 7.0* 7.3* 7.1* 6.5*  HCT 16.1*   < > 26.0*   < > 27.7*   < > 25.1*   < > 24.1* 26.3* 25.4* 23.1* 24.1* 23.5* 21.7*  MCV 89.0   < > 86.1  --  89.6  --  88.7  --  88.6 89.2  --   --   --   --   --   PLT 371   < > 293  --  265  --  273  --  251 332  --   --   --   --   --    < > = values in this interval not displayed.    Basic Metabolic Panel: Recent Labs  Lab 04/22/24 1442 04/23/24 0655 04/23/24 1754 04/24/24 0506 04/25/24 0424 04/26/24 0349 04/27/24 0444  NA  --    < > 140 140 139 142 143  K  --    < > 4.4 3.5 3.5 4.1 4.2  CL  --    < > 113* 110 109 110 109  CO2  --    < > 16* 23 23 23 27   GLUCOSE  --    < > 92 139* 90 125* 120*  BUN  --    < > 8 8 <5* 7* 8  CREATININE  --    < > 0.83 0.69 0.69 0.73 0.69  CALCIUM   --    < > 8.2* 7.9* 7.9* 8.4* 8.4*  MG 2.1  --   --   --   --   --   --   PHOS 2.9  --   --   --   --   --   --    < > = values in this interval not displayed.   GFR: Estimated Creatinine Clearance: 87.6 mL/min (by C-G formula based on SCr of 0.69 mg/dL). Recent Labs  Lab 04/23/24 1754 04/24/24 0506 04/25/24 0424 04/26/24 0349  WBC 11.7* 14.4* 11.3* 10.3    Critical care time:       [name], DO 04/28/24 9:52 PM [facility]  For contact information, see Amion. If no response to pager, please call PCCM 2H APP. After hours, 7PM- 7AM, please call on call APP for 2H.             "

## 2024-04-29 DIAGNOSIS — D649 Anemia, unspecified: Secondary | ICD-10-CM | POA: Diagnosis not present

## 2024-04-29 DIAGNOSIS — M86271 Subacute osteomyelitis, right ankle and foot: Secondary | ICD-10-CM | POA: Diagnosis not present

## 2024-04-29 DIAGNOSIS — I780 Hereditary hemorrhagic telangiectasia: Secondary | ICD-10-CM | POA: Diagnosis not present

## 2024-04-29 DIAGNOSIS — R040 Epistaxis: Secondary | ICD-10-CM | POA: Diagnosis not present

## 2024-04-29 DIAGNOSIS — D62 Acute posthemorrhagic anemia: Secondary | ICD-10-CM | POA: Diagnosis not present

## 2024-04-29 LAB — CBC
HCT: 24.3 % — ABNORMAL LOW (ref 36.0–46.0)
Hemoglobin: 7.3 g/dL — ABNORMAL LOW (ref 12.0–15.0)
MCH: 26.7 pg (ref 26.0–34.0)
MCHC: 30 g/dL (ref 30.0–36.0)
MCV: 89 fL (ref 80.0–100.0)
Platelets: 302 K/uL (ref 150–400)
RBC: 2.73 MIL/uL — ABNORMAL LOW (ref 3.87–5.11)
RDW: 17.7 % — ABNORMAL HIGH (ref 11.5–15.5)
WBC: 11.2 K/uL — ABNORMAL HIGH (ref 4.0–10.5)
nRBC: 0.3 % — ABNORMAL HIGH (ref 0.0–0.2)

## 2024-04-29 LAB — TYPE AND SCREEN
ABO/RH(D): A NEG
Antibody Screen: NEGATIVE
Unit division: 0

## 2024-04-29 LAB — BPAM RBC
Blood Product Expiration Date: 202602112359
ISSUE DATE / TIME: 202601140645
Unit Type and Rh: 600

## 2024-04-29 LAB — GLUCOSE, CAPILLARY: Glucose-Capillary: 102 mg/dL — ABNORMAL HIGH (ref 70–99)

## 2024-04-29 MED ORDER — PANTOPRAZOLE SODIUM 40 MG PO TBEC
40.0000 mg | DELAYED_RELEASE_TABLET | Freq: Two times a day (BID) | ORAL | Status: DC
  Administered 2024-04-29 – 2024-05-13 (×29): 40 mg via ORAL
  Filled 2024-04-29 (×29): qty 1

## 2024-04-29 MED ORDER — LISINOPRIL 20 MG PO TABS
20.0000 mg | ORAL_TABLET | Freq: Every day | ORAL | Status: DC
  Administered 2024-04-29 – 2024-05-13 (×15): 20 mg via ORAL
  Filled 2024-04-29 (×4): qty 1
  Filled 2024-04-29: qty 2
  Filled 2024-04-29 (×10): qty 1

## 2024-04-29 MED ORDER — GABAPENTIN 100 MG PO CAPS
100.0000 mg | ORAL_CAPSULE | Freq: Every day | ORAL | Status: DC
  Administered 2024-04-29 – 2024-05-11 (×13): 100 mg via ORAL
  Filled 2024-04-29 (×13): qty 1

## 2024-04-29 NOTE — Progress Notes (Signed)
 Physical Therapy Treatment Patient Details Name: Peggy House MRN: [id number] DOB: Aug 21, 1960 Today's Date: 04/29/2024   History of Present Illness 64 y.o. female admitted 04/22/24 with DOE, fatigue, several nosebleeds, black tarry stools. Workup for anemia, GIB, significant epistaxis with bloody aspiration. ETT 1/9-1/10. PMH includes hereditary hemorrhagic telangiectasia (off VEG-F inhibitor due to healing issues after ankle sx), multiple R ankle sxs with impaired healing (04/2023-07/2023), HTN, DM2, GIB, tobacco use.    PT Comments  Pt sitting up in recliner sound asleep on entry. Agreeable to work with therapy but needs to use bathroom. Pt does not think she can make it into the bathroom. Pt requiring modA for transfer from recliner to [employer]. After finishing she needs mod A for standing and total A for pericare. Able to walk back to bed with modA and return to supine with min A. Pt agrees that she is needing too much assistance to go directly home and is willing to go to rehab. Pt has changed discharge recommendations to skilled. PT will continue to follow acutely.     If plan is discharge home, recommend the following: A little help with walking and/or transfers;A little help with bathing/dressing/bathroom;Assistance with cooking/housework;Assist for transportation;Help with stairs or ramp for entrance   Can travel by private vehicle     Yes  Equipment Recommendations  None recommended by PT    Recommendations for Other Services       Precautions / Restrictions Precautions Precautions: Fall;Other (comment) Recall of Precautions/Restrictions: Intact Precaution/Restrictions Comments: h/o R ankle injury with multiple sxs (wound currently wrapped in gauze) Restrictions Weight Bearing Restrictions Per Provider Order: No     Mobility  Bed Mobility Overal bed mobility: Needs Assistance Bed Mobility: Sit to Supine       Sit to supine: Min assist   General bed mobility comments:  min A for returning LE to bed    Transfers Overall transfer level: Needs assistance Equipment used: Rolling walker (2 wheels) Transfers: Sit to/from Stand Sit to Stand: Mod assist   Step pivot transfers: Mod assist       General transfer comment: increased time and effort to come to standing for face to face pivot transfer to [facility], pt needing increased cuing to let go of recliner. modA for power up from [facility] to RW    Ambulation/Gait Ambulation/Gait assistance: Min assist Gait Distance (Feet): 5 Feet Assistive device: Rolling walker (2 wheels) Gait Pattern/deviations: Step-to pattern, Decreased stance time - right, Decreased stride length, Decreased weight shift to right, Shuffle Gait velocity: decreased Gait velocity interpretation: <1.31 ft/sec, indicative of household ambulator   General Gait Details: pt with trunk flexed and short, shuffling steps, minimal wt on RLE and heavy dependence on BUE support.         Balance Overall balance assessment: Needs assistance Sitting-balance support: No upper extremity supported, Feet supported Sitting balance-Leahy Scale: Fair     Standing balance support: Reliant on assistive device for balance Standing balance-Leahy Scale: Poor Standing balance comment: Reliant on BUE support                            Communication Communication Communication: No apparent difficulties  Cognition Arousal: Alert Behavior During Therapy: WFL for tasks assessed/performed                             Following commands: Intact      Cueing Cueing Techniques:  Verbal cues     General Comments General comments (skin integrity, edema, etc.): VSS on RA      Pertinent Vitals/Pain Pain Assessment Pain Assessment: 0-10 Pain Score: 7  Pain Location: R ankle Pain Descriptors / Indicators: Discomfort, Grimacing Pain Intervention(s): Limited activity within patient's tolerance, Monitored during session, Repositioned      PT Goals (current goals can now be found in the care plan section) Acute Rehab PT Goals Patient Stated Goal: to return home Progress towards PT goals: Progressing toward goals    Frequency    Min 2X/week       AM-PAC PT 6 Clicks Mobility   Outcome Measure  Help needed turning from your back to your side while in a flat bed without using bedrails?: A Little Help needed moving from lying on your back to sitting on the side of a flat bed without using bedrails?: A Little Help needed moving to and from a bed to a chair (including a wheelchair)?: A Little Help needed standing up from a chair using your arms (e.g., wheelchair or bedside chair)?: A Little Help needed to walk in hospital room?: Total (<20 ft) Help needed climbing 3-5 steps with a railing? : Total 6 Click Score: 14    End of Session Equipment Utilized During Treatment: Gait belt Activity Tolerance: Patient tolerated treatment well;Patient limited by pain Patient left: with call bell/phone within reach;in bed;with nursing/sitter in room Nurse Communication: Mobility status ([facility] needs placement) PT Visit Diagnosis: Other abnormalities of gait and mobility (R26.89);Muscle weakness (generalized) (M62.81);Pain Pain - Right/Left: Right Pain - part of body: Ankle and joints of foot     Time: 1720-1758 PT Time Calculation (min) (ACUTE ONLY): 38 min  Charges:    $Gait Training: 8-22 mins $Therapeutic Activity: 8-22 mins PT General Charges $$ ACUTE PT VISIT: 1 Visit                     Peggy Vi B. Peggy House PT, DPT Acute Rehabilitation Services Please use secure chat or  Call Office [phone]    [facility]eg[facility] 04/29/2024, 6:20 PM

## 2024-04-29 NOTE — Progress Notes (Signed)
 Occupational Therapy Treatment Patient Details Name: Peggy House MRN: [id number] DOB: May 08, 1960 Today's Date: 04/29/2024   History of present illness 64 y.o. female admitted 04/22/24 with DOE, fatigue, several nosebleeds, black tarry stools. Workup for anemia, GIB, significant epistaxis with bloody aspiration. ETT 1/9-1/10. PMH includes hereditary hemorrhagic telangiectasia (off VEG-F inhibitor due to healing issues after ankle sx), multiple R ankle sxs with impaired healing (04/2023-07/2023), HTN, DM2, GIB, tobacco use.   OT comments  Pt progressing towards goals. Pt reporting 7/10 pain in R ankle, potentially limiting performance. Pt required mod assist to rise from elevated bed height. Once in standing, pt with flexed trunk posture and posterior lean, verbal cues provided to correct. 1x LOB in standing, requiring min assist to correct. 3 additional STS completed 1x from recliner, other from chair, with pt continuing to require mod to max assist. OT discussed d/c planning with pt, because of lack of 24/7 physical assistance available recommending <3 hours of skilled rehab daily; at this time pt is agreeable to updated recs. Will continue to follow acutely.      If plan is discharge home, recommend the following:  A lot of help with walking and/or transfers;A lot of help with bathing/dressing/bathroom;Assistance with cooking/housework   Equipment Recommendations  None recommended by OT       Precautions / Restrictions Precautions Precautions: Fall;Other (comment) Recall of Precautions/Restrictions: Intact Precaution/Restrictions Comments: h/o R ankle injury with multiple sxs (wound currently wrapped in gauze) Restrictions Weight Bearing Restrictions Per Provider Order: No       Mobility Bed Mobility Overal bed mobility: Needs Assistance Bed Mobility: Supine to Sit     Supine to sit: Supervision     General bed mobility comments: S for safety with lines, no assist     Transfers Overall transfer level: Needs assistance Equipment used: Rolling walker (2 wheels) Transfers: Sit to/from Stand, Bed to chair/wheelchair/BSC Sit to Stand: Mod assist     Step pivot transfers: Mod assist     General transfer comment: mod assist to boost from elevated bed height, x1 posterior LOB towards bed, required min assist to correct.     Balance Overall balance assessment: Needs assistance Sitting-balance support: No upper extremity supported, Feet supported Sitting balance-Leahy Scale: Fair     Standing balance support: Bilateral upper extremity supported, During functional activity, Reliant on assistive device for balance Standing balance-Leahy Scale: Poor Standing balance comment: Reliant on BUE support       ADL either performed or assessed with clinical judgement   ADL Overall ADL's : Needs assistance/impaired     Grooming: Wash/dry face;Sitting       Lower Body Dressing: Maximal assistance;Sit to/from stand   Toilet Transfer: Moderate assistance;Stand-pivot       Functional mobility during ADLs: Moderate assistance General ADL Comments: Limited d/t poor standing balance    Extremity/Trunk Assessment Upper Extremity Assessment Upper Extremity Assessment: Generalized weakness   Lower Extremity Assessment Lower Extremity Assessment: Defer to PT evaluation        Vision   Vision Assessment?: No apparent visual deficits         Communication Communication Communication: No apparent difficulties   Cognition Arousal: Alert Behavior During Therapy: WFL for tasks assessed/performed Cognition: Cognition impaired     Executive functioning impairment (select all impairments): Problem solving OT - Cognition Comments: Poor safety awareness and insight into deficits   Following commands: Intact        Cueing   Cueing Techniques: Verbal cues  General Comments VSS on RA    Pertinent Vitals/ Pain       Pain Assessment Pain  Assessment: 0-10 Pain Score: 7  Pain Location: R ankle Pain Descriptors / Indicators: Discomfort, Grimacing Pain Intervention(s): Limited activity within patient's tolerance, Patient requesting pain meds-RN notified   Frequency  Min 2X/week        Progress Toward Goals  OT Goals(current goals can now be found in the care plan section)  Progress towards OT goals: OT to reassess next treatment  Acute Rehab OT Goals Patient Stated Goal: To get better OT Goal Formulation: With patient Time For Goal Achievement: 05/10/24 Potential to Achieve Goals: Good ADL Goals Pt Will Perform Lower Body Bathing: with modified independence;sit to/from stand;with adaptive equipment Pt Will Perform Lower Body Dressing: with modified independence;sit to/from stand;with adaptive equipment Pt Will Transfer to Toilet: with modified independence;ambulating Pt Will Perform Toileting - Clothing Manipulation and hygiene: with modified independence;sit to/from stand Additional ADL Goal #1: Pt will independently identify 3 strategies to reduce of falls.  Plan         AM-PAC OT 6 Clicks Daily Activity     Outcome Measure   Help from another person eating meals?: None Help from another person taking care of personal grooming?: A Little Help from another person toileting, which includes using toliet, bedpan, or urinal?: Total Help from another person bathing (including washing, rinsing, drying)?: A Lot Help from another person to put on and taking off regular upper body clothing?: A Little Help from another person to put on and taking off regular lower body clothing?: A Lot 6 Click Score: 15    End of Session Equipment Utilized During Treatment: Gait belt;Rolling walker (2 wheels)  OT Visit Diagnosis: Unsteadiness on feet (R26.81);Other abnormalities of gait and mobility (R26.89);Muscle weakness (generalized) (M62.81)   Activity Tolerance Patient tolerated treatment well   Patient Left in  chair;with call bell/phone within reach;with nursing/sitter in room   Nurse Communication Mobility status        Time: 8680-8652 OT Time Calculation (min): 28 min  Charges: OT General Charges $OT Visit: 1 Visit OT Treatments $Self Care/Home Management : 23-37 mins  Adrianne BROCKS, OT  Acute Rehabilitation Services Office [phone] Secure chat preferred   Adrianne GORMAN Savers 04/29/2024, 2:11 PM

## 2024-04-29 NOTE — Plan of Care (Signed)
  Problem: Education: Goal: Knowledge of General Education information will improve Description: Including pain rating scale, medication(s)/side effects and non-pharmacologic comfort measures Outcome: Progressing   Problem: Activity: Goal: Risk for activity intolerance will decrease Outcome: Progressing   Problem: Nutrition: Goal: Adequate nutrition will be maintained Outcome: Progressing   Problem: Elimination: Goal: Will not experience complications related to bowel motility Outcome: Progressing Goal: Will not experience complications related to urinary retention Outcome: Progressing   Problem: Pain Managment: Goal: General experience of comfort will improve and/or be controlled Outcome: Progressing   Problem: Skin Integrity: Goal: Risk for impaired skin integrity will decrease Outcome: Progressing

## 2024-04-29 NOTE — Progress Notes (Signed)
 "  NAME:  [name], MRN:  [id number], DOB:  [birth year], LOS: 7 ADMISSION DATE:  04/22/2024, CONSULTATION DATE:  04/24/2023 REFERRING MD:  Dr. Cindy, CHIEF COMPLAINT:  epistaxis   History of Present Illness:  [name] is a 64 y/o woman with a history of HHT, anemia requiring blood transfusions, who presented with Hb 4.1 after labs came back with low hemoglobin following visit with ID clinic. She complaints of DOE and fatigue over the past few days. She had dark tarry stools a few days prior to admission and several nosebleeds at home. She had epistaxis last night and again today during EGD. She had ~200cc blood suctioned out of orpharynx and gauze packed into her anterior nose during the EGD. She is not on chronic AC. She received 4 units of pRBC pre-EGD and another 1 units pRBC during the case. ENT was consulted and recommended using gauze with epi to stop the bleeding. She is transferring to the ICU post-EGD for airway protection until epistaxis is resolved.   Pertinent  Medical History  Per above  Significant Hospital Events: Including procedures, antibiotic start and stop dates in addition to other pertinent events   04/23/2023: Admitted for acute blood loss anemia and GIB 04/24/2023: epistaxis while in endoscopy, remained intubated for airway protection  1/12 repeat epistaxis, back to ICU, given TXA 1/14 got avastin  + IV iron   Interim History / Subjective:  One small R sided nosebleed overnight, no significant blood loss. Packing still in the back of her nose on the left. Some foot pain.   Objective    Blood pressure (!) 144/67, pulse (!) 58, temperature 98.3 F (36.8 C), temperature source Oral, resp. rate 16, height 5' 4 (1.626 m), weight 111.2 kg, SpO2 96%.        Intake/Output Summary (Last 24 hours) at 04/29/2024 1028 Last data filed at 04/29/2024 1018 Gross per 24 hour  Intake 1058.99 ml  Output 1450 ml  Net -391.01 ml    Filed Weights   04/25/24 0500  04/28/24 0500 04/29/24 0500  Weight: 108.2 kg 110.7 kg 111.2 kg    Examination: General: chronically ill appearing woman lying in bed in NAD HENT: [place]/ AT eyes anicteric, no visible packing in anterior nare Lungs: breathing comfortably on RA Cardiovascular: S1S2, RRR Abdomen: obese, soft, NT Extremities: no cyanosis or edema, ankle wound dressed Neuro: awake, alert, moving all extremities  WBC 11.2 H/H 7.3/24.3 Platelets 302   Resolved problem list  Endotracheally intubated for airway protection  Assessment and Plan   Severe epistaxis; aspirated blood during rapid onset epistaxis while under anesthesia; previously intubated -avastin  on 1/14 + IV iron  -redose IV txa if signifciant rebleed; has not had a significant bleed >2 days (1/12-1/13 overnight) -small epistaxis can treat with topical TXA -leaving packing in per ENT-- dissolvable -empiric antibiotics for TSS prophylaxis; stop in 2 days -daily CBC -follow up in [place] with ENT specialist for HHT  Hereditary hemorrhagic telangiectasias-- off VEG-F inhibitor due to healing issues after her ankle surgery GIB, likely due to angioectasias - on EGD multiple non-bleeding angioectasias treated with APC and clipping -- EGD did not explain the degree of anemia she had at presentation Epistaxis is acute on chronic Acute blood loss anemia on chronic anemia in the setting of above and epistaxis  -avastin  & IV iron  completed 1/14. Appreciate Hematology's management.  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 Dennise  H/o GERD, GI AVMs -PPI BID-- previously seen by [facility]> Continue twice daily PPI IV while inpatient. Would discharge on twice daily oral PPI for 8 weeks, followed by once daily indefinitely    History of anxiety/depression -con't PTA xanax , gabapentin , celexa   Transfer to the floor to monitor for bleeding. Always going to be high risk for rebleeding with her baseline medical condition and chronic nosebleeds. TRH to assume care tomorrow.    Labs   CBC: Recent Labs  Lab 04/22/24 1326 04/22/24 2106 04/24/24 0506 04/24/24 1233 04/25/24 0424 04/26/24 0349 04/26/24 2244 04/27/24 1244 04/27/24 1706 04/28/24 0524 04/28/24 2227 04/29/24 0516  WBC 10.6*   < > 14.4*  --  11.3* 10.3  --   --   --   --  11.9* 11.2*  NEUTROABS 9.1*  --   --   --   --   --   --   --   --   --   --   --   HGB 4.5*   < > 7.8*   < > 7.4* 8.0*   < > 7.3* 7.1* 6.5* 7.6* 7.3*  HCT 16.1*   < > 25.1*   < > 24.1* 26.3*   < > 24.1* 23.5* 21.7* 25.7* 24.3*  MCV 89.0   < > 88.7  --  88.6 89.2  --   --   --   --  88.0 89.0  PLT 371   < > 273  --  251 332  --   --   --   --  347 302   < > = values in this interval not displayed.    Basic Metabolic Panel: Recent Labs  Lab 04/22/24 1442 04/23/24 0655 04/23/24 1754 04/24/24 0506 04/25/24 0424 04/26/24 0349 04/27/24 0444  NA  --    < > 140 140 139 142 143  K  --    < > 4.4 3.5 3.5 4.1 4.2  CL  --    < > 113* 110 109 110 109  CO2  --    < > 16* 23 23 23 27   GLUCOSE  --    < > 92 139* 90 125* 120*  BUN  --    < > 8 8 <5* 7* 8  CREATININE  --    < > 0.83 0.69 0.69 0.73 0.69  CALCIUM   --    < > 8.2* 7.9* 7.9* 8.4* 8.4*  MG 2.1  --   --   --   --   --   --   PHOS 2.9  --   --   --   --   --   --    < > = values in this interval not displayed.   GFR: Estimated Creatinine Clearance: 87.8 mL/min (by C-G  formula based on SCr of 0.69 mg/dL). Recent Labs  Lab 04/25/24 0424 04/26/24 0349 04/28/24 2227 04/29/24 0516  WBC 11.3* 10.3 11.9* 11.2*    Critical care time:  [name], DO 04/29/24 10:34 AM [facility]  For contact information, see Amion. If no response to pager, please call PCCM 2H APP. After hours, 7PM- 7AM, please call on call APP for 2H.             "

## 2024-04-30 DIAGNOSIS — D649 Anemia, unspecified: Secondary | ICD-10-CM | POA: Diagnosis not present

## 2024-04-30 LAB — GLUCOSE, CAPILLARY: Glucose-Capillary: 131 mg/dL — ABNORMAL HIGH (ref 70–99)

## 2024-04-30 LAB — CBC
HCT: 24.4 % — ABNORMAL LOW (ref 36.0–46.0)
Hemoglobin: 7.5 g/dL — ABNORMAL LOW (ref 12.0–15.0)
MCH: 27.1 pg (ref 26.0–34.0)
MCHC: 30.7 g/dL (ref 30.0–36.0)
MCV: 88.1 fL (ref 80.0–100.0)
Platelets: 323 K/uL (ref 150–400)
RBC: 2.77 MIL/uL — ABNORMAL LOW (ref 3.87–5.11)
RDW: 18 % — ABNORMAL HIGH (ref 11.5–15.5)
WBC: 11.1 K/uL — ABNORMAL HIGH (ref 4.0–10.5)
nRBC: 0.2 % (ref 0.0–0.2)

## 2024-04-30 LAB — BASIC METABOLIC PANEL WITH GFR
Anion gap: 8 (ref 5–15)
BUN: 10 mg/dL (ref 8–23)
CO2: 24 mmol/L (ref 22–32)
Calcium: 8.5 mg/dL — ABNORMAL LOW (ref 8.9–10.3)
Chloride: 106 mmol/L (ref 98–111)
Creatinine, Ser: 0.75 mg/dL (ref 0.44–1.00)
GFR, Estimated: >60 mL/min
Glucose, Bld: 122 mg/dL — ABNORMAL HIGH (ref 70–99)
Potassium: 3.7 mmol/L (ref 3.5–5.1)
Sodium: 139 mmol/L (ref 135–145)

## 2024-04-30 MED ORDER — SODIUM CHLORIDE 0.9 % IV SOLN
125.0000 mg | Freq: Once | INTRAVENOUS | Status: AC
  Administered 2024-04-30: 125 mg via INTRAVENOUS
  Filled 2024-04-30: qty 10

## 2024-04-30 NOTE — Plan of Care (Signed)

## 2024-04-30 NOTE — Progress Notes (Addendum)
 " PROGRESS NOTE    Peggy House  FMW:[id number] DOB: 08/10/1960 DOA: 04/22/2024 PCP: Nooruddin, Saad, MD   Brief Narrative:   64 year old female history of chronic anemia including iron  deficiency, AVMs, depression/anxiety, chronic right ankle wound, chronic thrombocytopenia, hereditary hemorrhagic telangiectasia (HHT), HTN, HLD, GERD, DM II, osteomyelitis of Rt fibula and tibia (has been on multiple course of antibiotics lately 12/19 was given linezolid  x 2 weeks) who presented to ED with chief complaint of progressive generalized weakness, fatigue and dizziness.  Reported she has had nosebleed and had a episode of black stool approximately 3 days ago.  She is not on any blood thinners.  She had a history of GI bleed and nosebleeds in the past.  On arrival her hemoglobin was 4.1.  She underwent EGD which was complicated by severe epistaxis at the onset of the procedure, requiring emergent intubation, nasal packing and transferred to the ICU.  EGD showed numerous gastric AVMs, 5 were treated with APC some with Hemoclip, video capsule endoscopy was also completed   She has been in and out of the ICU TRH assumed care on 1/16 Lives at home. HHS arranged Needs 6 weeks of IV antibiotics at home   Significant events:  04/22/2024: Admitted for acute blood loss anemia and GI bleed 04/23/2024: Epistaxis on endoscopy, remained intubated for airway protection 04/26/2014: Repeat epistaxis, back to ICU, given TXA 04/28/2024: Got Avastin  and IV iron   04/30/2024: Care transferred to [facility] team.  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 - Empiric antibiotics for TSS prophylaxis, supposed to be stopped on 05/01/2024 - Follow CBC closely - Follow-up outpatient with [facility] specialist in [facility]  Hereditary hemorrhagic telangiectasias, POA: Off of VEGF inhibitor due to healing issues after ankle surgery.  Outpatient follow-up with ENT specialist emergency room   Essential hypertension, POA: Continue with amlodipine  and lisinopril .   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  Plan is vancomycin  for 6 weeks via Mediport end date of 06/04/24.  Home health services arrangement.  Outpatient follow-up with Dr. Dennise, ID on 05/06/2024  GERD, POA: Continue with Protonix   Anxiety/Depression: Continue with celexa , gabepentin and xanax .  Physical deconditioning: PT recommended SNF  Disposition: PT recommended SNF  DVT prophylaxis: SCDs Start: 04/22/24 1441     Code Status: Full Code Family Communication:  None at the bedside Status is: Inpatient Remains inpatient appropriate because: Bleeding    Subjective:  No acute events overnight. No active bleeding. We spoke about the need for 6 weeks of IV Vancomycin  as recommended by ID. She lives at home with her son.  Examination:  General exam: Appears calm and comfortable  Respiratory system: Clear to auscultation. Respiratory effort normal. Cardiovascular system: S1 &  S2 heard, RRR. No JVD, murmurs, rubs, gallops or clicks. No pedal edema. Gastrointestinal system: Abdomen is nondistended, soft and nontender. No organomegaly or masses felt.  Normal bowel sounds heard. Central nervous system: Alert and oriented. No focal neurological deficits. Extremities: Symmetric 5 x 5 power. Skin: No rashes, lesions or ulcers Psychiatry: Judgement and insight appear normal. Mood & affect appropriate.       Diet Orders (From admission, onward)     Start     Ordered   04/27/24 1504  Diet regular Room service appropriate? Yes; Fluid consistency: Thin  Diet effective now       Question Answer Comment  Room service appropriate? Yes   Fluid consistency: Thin      04/27/24 1503            Objective: Vitals:   04/30/24 0300 04/30/24 0421 04/30/24 0500 04/30/24 0815  BP: (!) 123/54 139/63  (!) 122/56  Pulse: 71 70  65  Resp: 19 18    Temp:  98.7 F (37.1 C)  98 F (36.7 C)  TempSrc:  Oral    SpO2: 94% 97%  97%  Weight:   114.7 kg   Height:        Intake/Output Summary (Last 24 hours) at 04/30/2024 1126 Last data filed at 04/30/2024 0200 Gross per 24 hour  Intake 520 ml  Output 500 ml  Net 20 ml   Filed Weights   04/28/24 0500 04/29/24 0500 04/30/24 0500  Weight: 110.7 kg 111.2 kg 114.7 kg    Scheduled Meds:  aminocaproic  acid  1 g Oral Q8H   amLODipine   10 mg Oral Daily   amoxicillin   500 mg Oral Q12H   Chlorhexidine  Gluconate Cloth  6 each Topical Daily   citalopram   20 mg Oral Daily   gabapentin   100 mg Oral QHS   lisinopril   20 mg Oral Daily   pantoprazole   40 mg Oral BID   polyethylene glycol  17 g Oral Daily   senna  1 tablet Oral BID   sodium chloride  flush  10-40 mL Intracatheter Q12H   sodium chloride  flush  3 mL Intravenous Q12H   sodium chloride  flush  3 mL Intravenous Q12H   sucralfate   1 g Oral TID WC & HS   Continuous Infusions:  vancomycin  Stopped (04/30/24 0108)    Nutritional status     Body mass index is 43.4 kg/m.  Data Reviewed:   CBC: Recent Labs  Lab 04/25/24 0424 04/26/24 0349 04/26/24 2244 04/27/24 1706 04/28/24 0524 04/28/24 2227 04/29/24 0516 04/30/24 1015  WBC  11.3* 10.3  --   --   --  11.9* 11.2* 11.1*  HGB 7.4* 8.0*   < > 7.1* 6.5* 7.6* 7.3* 7.5*  HCT 24.1* 26.3*   < > 23.5* 21.7* 25.7* 24.3* 24.4*  MCV 88.6 89.2  --   --   --  88.0 89.0 88.1  PLT 251 332  --   --   --  347 302 323   < > = values in this interval not displayed.   Basic Metabolic Panel: Recent Labs  Lab 04/24/24 0506 04/25/24 0424 04/26/24 0349 04/27/24 0444 04/30/24 1015  NA 140 139 142 143 139  K 3.5 3.5 4.1 4.2 3.7  CL 110 109 110 109 106  CO2 23 23 23 27 24   GLUCOSE 139* 90 125* 120* 122*  BUN 8 <5* 7* 8 10  CREATININE 0.69 0.69 0.73 0.69 0.75  CALCIUM  7.9* 7.9* 8.4* 8.4*  8.5*   GFR: Estimated Creatinine Clearance: 89.4 mL/min (by C-G formula based on SCr of 0.75 mg/dL). Liver Function Tests: No results for input(s): AST, ALT, ALKPHOS, BILITOT, PROT, ALBUMIN in the last 168 hours. No results for input(s): LIPASE, AMYLASE in the last 168 hours. No results for input(s): AMMONIA in the last 168 hours. Coagulation Profile: Recent Labs  Lab 04/23/24 1506 04/23/24 1754  INR 1.2 1.2   Cardiac Enzymes: No results for input(s): CKTOTAL, CKMB, CKMBINDEX, TROPONINI in the last 168 hours. BNP (last 3 results) No results for input(s): PROBNP in the last 8760 hours. HbA1C: No results for input(s): HGBA1C in the last 72 hours. CBG: Recent Labs  Lab 04/25/24 2248 04/26/24 0804 04/27/24 0807 04/29/24 0758 04/30/24 0817  GLUCAP 123* 134* 109* 102* 131*   Lipid Profile: No results for input(s): CHOL, HDL, LDLCALC, TRIG, CHOLHDL, LDLDIRECT in the last 72 hours. Thyroid Function Tests: No results for input(s): TSH, T4TOTAL, FREET4, T3FREE, THYROIDAB in the last 72 hours. Anemia Panel: No results for input(s): VITAMINB12, FOLATE, FERRITIN, TIBC, IRON , RETICCTPCT in the last 72 hours. Sepsis Labs: No results for input(s): PROCALCITON, LATICACIDVEN in the last 168 hours.  No results found for  this or any previous visit (from the past 240 hours).       Radiology Studies: No results found.         LOS: 8 days   Time spent= 35 mins    Deliliah Room, MD [facility]  If 7PM-7AM, please contact night-coverage  04/30/2024, 11:26 AM  "

## 2024-04-30 NOTE — Progress Notes (Signed)
 Transition of Care [facility]) - Inpatient Brief Assessment   Patient Details  Name: Peggy House MRN: [id number] Date of Birth: 21-May-1960  Transition of Care [facility]) CM/SW Contact:    Rosaline JONELLE Joe, RN Phone Number: 04/30/2024, 11:59 AM   Clinical Narrative: Patient admitted to the hospital with GI bleed.  CM spoke with MD and patient will need IV Vancomycin  for 6 weeks.  The patient was recommended by PT/OT for SNF placement and patient states that she is agreeable to SNF placement.  Patient has a right chest port-a-cath.  Patient lives in the home with son, daughter-in-law and grandson.  The patient is alone during the day since family is at work/school.  DME in the home includes WC, RW.  Patient is a current smoker and denies ETOH and drug use.  Patient will be faxed out in the hub for [facility] placement and IV antibiotics for the next 6 weeks.   Transition of Care Asessment: Insurance and Status: (P) Insurance coverage has been reviewed Patient has primary care physician: (P) Yes Home environment has been reviewed: (P) from home with family Prior level of function:: (P) self Prior/Current Home Services: (P) No current home services Social Drivers of Health Review: (P) SDOH reviewed needs interventions Readmission risk has been reviewed: (P) Yes Transition of care needs: (P) transition of care needs identified, TOC will continue to follow

## 2024-04-30 NOTE — Progress Notes (Addendum)
 Peggy House   DOB:11/16/1960   FM#:[id number]      CLINICAL SUMMARY:  Peggy House is a 64 year old female patient admitted 04/22/2024 with complaints of generalized weakness, fatigue and dizziness. Reported nosebleed 3 days prior with episode of black stool. 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 Medical hematology following closely.    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  Required multiple transfusions and APC's since 2019. - Extensive workup confirmed HHT based on personal and family history. - Status post frequent blood transfusions, IV iron  and bevacizumab  with good response.  However patient has been noncompliant with outpatient appointments.    ASSESSMENT & PLAN:  HHT (hereditary hemorrhagic telangiectasia),  Recurrent GI bleeding and severe epistaxis  -- Admitted 04/22/2024 with complaints of weakness, fatigue, and dizziness. -- Large amount of nosebleed 04/26/24 necessitating Rapid Response. Seen by ENT and nares was packed. -- Given bevacizumab  04/28/24. Tolerated well. Next dosage due 05/12/24. Pending discharge to SNF. Patient is agreeable to coming for 1/28 beva. -- Medical Hematology/Dr. Lanny following closely.  Anemia Iron  deficiency anemia -- Related to chronic epistaxis and GI Bleeding -- FOBT+ x2 -- Hemoglobin remains low 7.5 -- Given IV iron  infusions. Recommend additional dose today.    -- Recommend PRBC transfusion for HGB <7.0 -- Continue to monitor CBC with diff  RLE Chronic wound -- Status post removal of hardware with I&D -- Seen by ID. Plan vanco x6 weeks, continue upon discharge     Code Status Full   Subjective:  Patient seen awake and alert laying in bed. Reports that she is okay, complains of right ankle pain and ongoing fatigue.  Denies additional epistaxis, states packing in her nose fell out  last night and she has had no more bleeding.  States she is going to nursing facility for a few weeks. Agreeable to coming in for next beva in two weeks. No other complaints offered.   Objective:   Intake/Output Summary (Last 24 hours) at 04/30/2024 1250 Last data filed at 04/30/2024 0200 Gross per 24 hour  Intake 520 ml  Output 500 ml  Net 20 ml     PHYSICAL EXAMINATION: ECOG PERFORMANCE STATUS: 3 - Symptomatic, >50% confined to bed  Vitals:   04/30/24 0815 04/30/24 1213  BP: (!) 122/56 (!) 117/54  Pulse: 65 64  Resp:    Temp: 98 F (36.7 C) 98.9 F (37.2 C)  SpO2: 97% 97%   Filed Weights   04/28/24 0500 04/29/24 0500 04/30/24 0500  Weight: 244 lb 0.8 oz (110.7 kg) 245 lb 2.4 oz (111.2 kg) 252 lb 13.9 oz (114.7 kg)    GENERAL: alert, no distress and comfortable SKIN: skin color, texture, turgor are normal, no rashes or significant lesions EYES: normal, conjunctiva are pink and non-injected, sclera clear OROPHARYNX: no exudate, no erythema and lips, buccal mucosa, and tongue normal  NECK: supple, thyroid normal size, non-tender, without nodularity LYMPH: no palpable lymphadenopathy in the cervical, axillary or inguinal LUNGS: clear to auscultation and percussion with normal breathing effort HEART: regular rate & rhythm and no murmurs and no lower extremity edema ABDOMEN: abdomen soft, non-tender and normal bowel sounds MUSCULOSKELETAL: +R ankle with dressing intact, c/o pain  PSYCH: alert & oriented x 3 with fluent speech NEURO: no focal motor/sensory deficits   All questions were answered. The patient knows to call the clinic with  any problems, questions or concerns.   I personally spent a total of 40 minutes minutes in the care of the patient today including preparing to see the patient, performing a medically appropriate exam/evaluation, counseling and educating, referring and communicating with other health care professionals, documenting clinical information in the  EHR, communicating results, and coordinating care.    Olam PARAS Rouson, NP 04/30/2024 12:50 PM    Labs Reviewed:  Lab Results  Component Value Date   WBC 11.1 (H) 04/30/2024   HGB 7.5 (L) 04/30/2024   HCT 24.4 (L) 04/30/2024   MCV 88.1 04/30/2024   PLT 323 04/30/2024   Recent Labs    08/20/23 0000 12/24/23 0800 02/18/24 1300 03/04/24 1440 03/05/24 0452 04/21/24 1532 04/22/24 1326 04/23/24 0655 04/26/24 0349 04/27/24 0444 04/30/24 1015  NA  --    < > 142   < > 139 141 143   < > 142 143 139  K  --    < > 3.7   < > 3.9 3.9 3.8   < > 4.1 4.2 3.7  CL  --    < > 110   < > 109 110 110   < > 110 109 106  CO2  --    < > 25   < > 21* 23 24   < > 23 27 24   GLUCOSE  --    < > 92   < > 86 89 147*   < > 125* 120* 122*  BUN  --    < > 7*   < > 21 9 9    < > 7* 8 10  CREATININE  --    < > 0.87   < > 0.73 0.86 0.81   < > 0.73 0.69 0.75  CALCIUM   --    < > 9.1   < > 8.2* 8.4* 8.7*   < > 8.4* 8.4* 8.5*  GFRNONAA  --    < > >60   < > >60  --  >60   < > >60 >60 >60  GFRAA 98  --   --   --   --   --   --   --   --   --   --   PROT  --    < > 7.0  --  5.5* 6.1 6.2*  --   --   --   --   ALBUMIN  --    < > 3.9  --  2.7*  --  3.8  --   --   --   --   AST  --    < > 10*  --  11* 9* 11*  --   --   --   --   ALT  --    < > <5  --  7 6 5   --   --   --   --   ALKPHOS  --    < > 93  --  61  --  92  --   --   --   --   BILITOT  --    < > 0.3  --  0.7 0.2 <0.2  --   --   --   --    < > = values in this interval not displayed.    Studies Reviewed:   ECHOCARDIOGRAM COMPLETE Result Date: 04/25/2024    ECHOCARDIOGRAM REPORT   Patient Name:   Peggy House Date of Exam: 04/25/2024  Medical Rec #:  [id number]              Height:       64.0 in Accession #:    [id number]             Weight:       238.5 lb Date of Birth:  June 03, 1960              BSA:          2.109 m Patient Age:    63 years               BP:           136/63 mmHg Patient Gender: F                      HR:           119 bpm. Exam  Location:  Inpatient Procedure: 2D Echo, Cardiac Doppler, Color Doppler and Strain Analysis (Both            Spectral and Color Flow Doppler were utilized during procedure). Indications:    R94.31 Abnormal EKG  History:        Patient has prior history of Echocardiogram examinations, most                 recent 04/14/2023. Signs/Symptoms:Syncope,                 Dizziness/Lightheadedness and Fatigue; Risk Factors:Diabetes,                 Hypertension, Dyslipidemia and Current Smoker. Port.  Sonographer:    Ellouise Mose RDCS Referring Phys: [id number] [facility]  Sonographer Comments: Patient is obese. Image acquisition challenging due to patient body habitus. IMPRESSIONS  1. Left ventricular ejection fraction, by estimation, is 65 to 70%. Left ventricular ejection fraction by 2D MOD biplane is 69.5 %. The left ventricle has normal function. The left ventricle has no regional wall motion abnormalities. There is severe left ventricular hypertrophy. Left ventricular diastolic parameters are consistent with Grade II diastolic dysfunction (pseudonormalization). Elevated left atrial pressure. The average left ventricular global longitudinal strain is -24.3 %. The global longitudinal strain is normal.  2. Right ventricular systolic function is normal. The right ventricular size is moderately enlarged. There is severely elevated pulmonary artery systolic pressure. The estimated right ventricular systolic pressure is 60.2 mmHg.  3. Left atrial size was moderately dilated.  4. Right atrial size was moderately dilated.  5. The mitral valve is normal in structure. Trivial mitral valve regurgitation. No evidence of mitral stenosis.  6. Tricuspid valve regurgitation is mild to moderate.  7. There is a calcified nodule on the left coronary cusp (#32) and mild to moderate AS with PV 3.1 m/sec, MG 19 mmhg and DVI 0.46. There is evidence of high flow state with LVOT VTI 29 cm. The aortic valve is tricuspid. Aortic valve regurgitation  is trivial. Mild to moderate aortic valve stenosis.  8. There is Moderate (Grade III) atheroma plaque involving the aortic arch.  9. There is late systolic flow reversal of hepatic vein suggestive noncompliant RV. The inferior vena cava is dilated in size with <50% respiratory variability, suggesting right atrial pressure of 15 mmHg. Comparison(s): Prior images reviewed side by side. EF is similar to prior. There is now evidence of pulmonary HTN with RVSP 60, dilated RV/RA, and new aortic stenosis. FINDINGS  Left Ventricle: Left ventricular ejection fraction, by estimation, is 65  to 70%. Left ventricular ejection fraction by 2D MOD biplane is 69.5 %. The left ventricle has normal function. The left ventricle has no regional wall motion abnormalities. The average left ventricular global longitudinal strain is -24.3 %. Strain was performed and the global longitudinal strain is normal. The left ventricular internal cavity size was normal in size. There is severe left ventricular hypertrophy. Left ventricular diastolic parameters are consistent with Grade II diastolic dysfunction (pseudonormalization). Elevated left atrial pressure. Right Ventricle: The right ventricular size is moderately enlarged. No increase in right ventricular wall thickness. Right ventricular systolic function is normal. There is severely elevated pulmonary artery systolic pressure. The tricuspid regurgitant velocity is 3.36 m/s, and with an assumed right atrial pressure of 15 mmHg, the estimated right ventricular systolic pressure is 60.2 mmHg. Left Atrium: Left atrial size was moderately dilated. Right Atrium: Right atrial size was moderately dilated. Pericardium: There is no evidence of pericardial effusion. Mitral Valve: The mitral valve is normal in structure. Trivial mitral valve regurgitation. No evidence of mitral valve stenosis. Tricuspid Valve: The tricuspid valve is normal in structure. Tricuspid valve regurgitation is mild to moderate.  No evidence of tricuspid stenosis. Aortic Valve: There is a calcified nodule on the left coronary cusp (#32) and mild to moderate AS with PV 3.1 m/sec, MG 19 mmhg and DVI 0.46. There is evidence of high flow state with LVOT VTI 29 cm. The aortic valve is tricuspid. Aortic valve regurgitation is trivial. Mild to moderate aortic stenosis is present. Aortic valve mean gradient measures 19.0 mmHg. Aortic valve peak gradient measures 39.4 mmHg. Aortic valve area, by VTI measures 1.45 cm. Pulmonic Valve: The pulmonic valve was normal in structure. Pulmonic valve regurgitation is not visualized. No evidence of pulmonic stenosis. Aorta: The aortic root and ascending aorta are structurally normal, with no evidence of dilitation. There is moderate (Grade III) atheroma plaque involving the aortic arch. Venous: A normal flow pattern is recorded from the right upper pulmonary vein. There is late systolic flow reversal of hepatic vein suggestive noncompliant RV. The inferior vena cava is dilated in size with less than 50% respiratory variability, suggesting right atrial pressure of 15 mmHg. IAS/Shunts: No atrial level shunt detected by color flow Doppler.  LEFT VENTRICLE PLAX 2D                        Biplane EF (MOD) LVIDd:         5.40 cm         LV Biplane EF:   Left LVIDs:         3.00 cm                          ventricular LV PW:         1.30 cm                          ejection LV IVS:        1.10 cm                          fraction by LVOT diam:     2.00 cm                          2D MOD LV SV:         91  biplane is LV SV Index:   43                               69.5 %. LVOT Area:     3.14 cm LV IVRT:       85 msec         Diastology                                LV e' medial:    10.80 cm/s                                LV E/e' medial:  14.6 LV Volumes (MOD)               LV e' lateral:   11.20 cm/s LV vol d, MOD    150.0 ml      LV E/e' lateral: 14.1 A2C: LV vol d, MOD    147.0 ml       2D Longitudinal A4C:                           Strain LV vol s, MOD    50.4 ml       2D Strain GLS   -26.4 % A2C:                           (A4C): LV vol s, MOD    39.2 ml       2D Strain GLS   -23.8 % A4C:                           (A3C): LV SV MOD A2C:   99.6 ml       2D Strain GLS   -22.7 % LV SV MOD A4C:   147.0 ml      (A2C): LV SV MOD BP:    103.3 ml      2D Strain GLS   -24.3 %                                Avg: RIGHT VENTRICLE             IVC RV S prime:     14.40 cm/s  IVC diam: 2.90 cm TAPSE (M-mode): 2.9 cm                             PULMONARY VEINS                             Diastolic Velocity: 42.50 cm/s                             S/D Velocity:       1.40                             Systolic Velocity:  60.00 cm/s LEFT ATRIUM             Index  RIGHT ATRIUM           Index LA diam:        4.00 cm 1.90 cm/m   RA Area:     26.10 cm LA Vol (A2C):   95.1 ml 45.10 ml/m  RA Volume:   85.40 ml  40.50 ml/m LA Vol (A4C):   75.0 ml 35.57 ml/m LA Biplane Vol: 88.9 ml 42.16 ml/m  AORTIC VALVE AV Area (Vmax):    1.31 cm AV Area (Vmean):   1.49 cm AV Area (VTI):     1.45 cm AV Vmax:           314.00 cm/s AV Vmean:          187.750 cm/s AV VTI:            0.631 m AV Peak Grad:      39.4 mmHg AV Mean Grad:      19.0 mmHg LVOT Vmax:         131.00 cm/s LVOT Vmean:        89.300 cm/s LVOT VTI:          0.291 m LVOT/AV VTI ratio: 0.46  AORTA Ao Root diam: 3.30 cm Ao Asc diam:  3.70 cm MITRAL VALVE                TRICUSPID VALVE MV Area (PHT): 2.76 cm     TR Peak grad:   45.2 mmHg MV Decel Time: 275 msec     TR Vmax:        336.00 cm/s MV E velocity: 158.00 cm/s MV A velocity: 123.00 cm/s  SHUNTS MV E/A ratio:  1.28         Systemic VTI:  0.29 m                             Systemic Diam: 2.00 cm Joelle Azobou Tonleu Electronically signed by Joelle Cedars Tonleu Signature Date/Time: 04/25/2024/3:02:27 PM    Final    DG CHEST PORT 1 VIEW Result Date: 04/23/2024 CLINICAL DATA:  Endotracheal intubation  EXAM: PORTABLE CHEST 1 VIEW COMPARISON:  April 22, 2024 FINDINGS: Stable cardiomegaly. Endotracheal tube is noted distal tip 1.5 cm above the carina. Right internal jugular Port-A-Cath is unchanged. Right lung is clear. Mild left retrocardiac opacity is noted concerning for possible atelectasis or infiltrate and associated effusion. IMPRESSION: Endotracheal tube is noted with distal tip 1.5 cm above the carina. Mild left retrocardiac opacity is noted concerning for possible atelectasis or infiltrate and associated effusion. Electronically Signed   By: Lynwood Landy Raddle M.D.   On: 04/23/2024 15:32   US  EKG SITE RITE Result Date: 04/23/2024 If Site Rite image not attached, placement could not be confirmed due to current cardiac rhythm.  DG Chest 2 View Result Date: 04/22/2024 CLINICAL DATA:  Shortness of breath EXAM: CHEST - 2 VIEW COMPARISON:  March 04, 2024 FINDINGS: Stable cardiomegaly. Right internal jugular port is unchanged. Both lungs are clear. The visualized skeletal structures are unremarkable. IMPRESSION: No active cardiopulmonary disease. Electronically Signed   By: Lynwood Landy Raddle M.D.   On: 04/22/2024 14:08    Addendum I have seen the patient, examined her. I agree with the assessment and and plan and have edited the notes.   Patient received Avastin  2 days ago, no significant further bleeding since then.  Her hemoglobin has been stable.  Will give 1 more dose ferric gluconate today, and continue Amicar .  If she will be discharged soon, I plan to follow-up her in the office and give next dose of Avastin  on January 28.  I will schedule that.  Will follow-up as needed before discharge.  [name] MD 04/30/2024

## 2024-04-30 NOTE — Plan of Care (Signed)
" °  Problem: Education: Goal: Knowledge of General Education information will improve Description: Including pain rating scale, medication(s)/side effects and non-pharmacologic comfort measures Outcome: Progressing   Problem: Health Behavior/Discharge Planning: Goal: Ability to manage health-related needs will improve Outcome: Progressing   Problem: Clinical Measurements: Goal: Will remain free from infection Outcome: Progressing Goal: Respiratory complications will improve Outcome: Progressing Goal: Cardiovascular complication will be avoided Outcome: Progressing   Problem: Coping: Goal: Level of anxiety will decrease Outcome: Progressing   Problem: Pain Managment: Goal: General experience of comfort will improve and/or be controlled Outcome: Progressing   Problem: Respiratory: Goal: Ability to maintain a clear airway and adequate ventilation will improve Outcome: Progressing   Problem: Role Relationship: Goal: Method of communication will improve Outcome: Progressing   "

## 2024-04-30 NOTE — NC FL2 (Signed)
 " [place]  MEDICAID FL2 LEVEL OF CARE FORM     IDENTIFICATION  Patient Name: Peggy House Birthdate: 1960/10/21 Sex: female Admission Date (Current Location): 04/22/2024  [facility] and [place] Number:  Producer, Television/film/video and Address:  [facility], [address] [place], [place] [address]      Provider Number: [id number]  Attending Physician Name and Address:  Dino Antu, MD  Relative Name and Phone Number:  Jorene Isley/son: [phone]    Current Level of Care: Hospital Recommended Level of Care: Skilled Nursing Facility Prior Approval Number:    Date Approved/Denied:   PASRR Number: [id number]  Discharge Plan: SNF    Current Diagnoses: Patient Active Problem List   Diagnosis Date Noted   Nodule of intestine 04/24/2024   On mechanically assisted ventilation (HCC) 04/23/2024   Acute respiratory failure with hypoxia (HCC) 04/23/2024   GIB (gastrointestinal bleeding) 04/22/2024   AVM (arteriovenous malformation) of small bowel, acquired 03/06/2024   Gastric ulcer without hemorrhage or perforation 03/06/2024   Gastric AVM 03/06/2024   Acute upper GI bleeding 03/04/2024   Epistaxis 12/27/2023   Generalized anxiety disorder 08/13/2023   Coping style affecting medical condition 08/13/2023   Closed trimalleolar fracture of right ankle 08/13/2023   Open wound of right ankle; discharged with wound vac 07/29/2023   Heart murmur, systolic 07/27/2023   Osteomyelitis (HCC) 07/17/2023   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 07/13/2023   Thrombocytopenia 04/15/2023   ABLA (acute blood loss anemia) 01/31/2023   Cholelithiasis 11/18/2022   Aortic atherosclerosis 11/18/2022   Tobacco use disorder 06/22/2022   Depression with anxiety 04/12/2022   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   Syncope 11/01/2020   Leg pain, bilateral 09/28/2019   Urge incontinence 09/28/2019   Pain due to  onychomycosis of toenails of both feet 02/26/2019   Diabetes mellitus (HCC) 02/26/2019   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   Symptomatic anemia 12/07/2018   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   Acute blood loss anemia 12/03/2018   Port-A-Cath in place 09/18/2018   GAD (generalized anxiety disorder) 01/20/2018   Pulmonary artery hypertension (HCC) 10/19/2017   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   Venous stasis dermatitis of both lower extremities 05/07/2017   Anemia due to acute blood loss secondary to Epistaxis 11/29/2016   Iron  deficiency anemia 10/29/2016   Osteoarthritis, multiple sites 06/05/2015   Insomnia 06/05/2015   Major depressive disorder, recurrent episode 06/05/2015   Obesity, Class III, BMI 40-49.9 (morbid obesity) (HCC) 04/26/2014   HLD (hyperlipidemia) 04/25/2014   Essential hypertension     Orientation RESPIRATION BLADDER Height & Weight     Self, Time, Situation, Place  Normal Incontinent, External catheter Weight: 252 lb 13.9 oz (114.7 kg) Height:  5' 4 (162.6 cm)  BEHAVIORAL SYMPTOMS/MOOD NEUROLOGICAL BOWEL NUTRITION STATUS      Continent Diet (please refer to dc summary)  AMBULATORY STATUS COMMUNICATION OF NEEDS Skin   Limited Assist Verbally Other (Comment) (wound on right ankle)                       Personal Care Assistance Level of Assistance  Bathing, Dressing, Feeding Bathing Assistance: Limited assistance Feeding assistance: Independent Dressing Assistance: Limited assistance     Functional Limitations Info  Sight, Hearing, Speech Sight Info: Adequate Hearing Info: Adequate Speech Info: Adequate    SPECIAL CARE FACTORS FREQUENCY  PT (By licensed PT), OT (By licensed OT)  PT Frequency: 5x/week OT Frequency: 5x/week            Contractures Contractures Info: Not present    Additional Factors Info  Code Status, Allergies Code Status Info: FULL Allergies Info: Feraheme , NSAIDs, wasp  venom, tomato, iron  (ferrous sulfate )           Current Medications (04/30/2024):  This is the current hospital active medication list Current Facility-Administered Medications  Medication Dose Route Frequency Provider Last Rate Last Admin   acetaminophen  (TYLENOL ) tablet 650 mg  650 mg Oral Q6H PRN Shahmehdi, Seyed A, MD       Or   acetaminophen  (TYLENOL ) suppository 650 mg  650 mg Rectal Q6H PRN Shahmehdi, Seyed A, MD       ALPRAZolam  (XANAX ) tablet 0.5 mg  0.5 mg Oral QHS PRN Wilson, Tara N, PA-C   0.5 mg at 04/29/24 2129   aminocaproic  acid (AMICAR ) tablet 1 g  1 g Oral Q8H Lanny Callander, MD   1 g at 04/30/24 0533   amLODipine  (NORVASC ) tablet 10 mg  10 mg Oral Daily Shahmehdi, Seyed A, MD   10 mg at 04/30/24 0915   amoxicillin  (AMOXIL ) capsule 500 mg  500 mg Oral Q12H Gretta Doffing P, DO   500 mg at 04/30/24 0915   bisacodyl  (DULCOLAX) EC tablet 5 mg  5 mg Oral Daily PRN Willette Adriana LABOR, MD       Chlorhexidine  Gluconate Cloth 2 % PADS 6 each  6 each Topical Daily Gretta Doffing SQUIBB, DO   6 each at 04/30/24 0915   citalopram  (CELEXA ) tablet 20 mg  20 mg Oral Daily Shahmehdi, Seyed A, MD   20 mg at 04/30/24 0915   gabapentin  (NEURONTIN ) capsule 100 mg  100 mg Oral QHS Gretta Doffing P, DO   100 mg at 04/29/24 2129   hydrALAZINE  (APRESOLINE ) injection 10 mg  10 mg Intravenous Q4H PRN Shahmehdi, Seyed A, MD       HYDROmorphone  (DILAUDID ) injection 0.5-1 mg  0.5-1 mg Intravenous Q2H PRN Shahmehdi, Seyed A, MD   1 mg at 04/30/24 1156   ipratropium (ATROVENT ) nebulizer solution 0.5 mg  0.5 mg Nebulization Q6H PRN Willette Adriana A, MD   0.5 mg at 04/24/24 9185   lisinopril  (ZESTRIL ) tablet 20 mg  20 mg Oral Daily Gretta Doffing P, DO   20 mg at 04/30/24 0915   ondansetron  (ZOFRAN ) tablet 4 mg  4 mg Oral Q6H PRN Shahmehdi, Adriana LABOR, MD       Or   ondansetron  (ZOFRAN ) injection 4 mg  4 mg Intravenous Q6H PRN Willette Adriana LABOR, MD       Oral care mouth rinse  15 mL Mouth Rinse PRN Gretta Doffing P, DO        oxyCODONE  (Oxy IR/ROXICODONE ) immediate release tablet 5 mg  5 mg Oral Q4H PRN Shahmehdi, Seyed A, MD   5 mg at 04/29/24 1045   pantoprazole  (PROTONIX ) EC tablet 40 mg  40 mg Oral BID Gretta Doffing P, DO   40 mg at 04/30/24 0915   polyethylene glycol (MIRALAX  / GLYCOLAX ) packet 17 g  17 g Oral Daily Gretta Doffing P, DO   17 g at 04/30/24 9083   senna (SENOKOT) tablet 8.6 mg  1 tablet Oral BID Gretta Doffing P, DO   8.6 mg at 04/30/24 0915   senna-docusate (Senokot-S) tablet 1 tablet  1 tablet Oral QHS PRN Willette Adriana LABOR, MD       sodium chloride  flush (NS)  0.9 % injection 10-40 mL  10-40 mL Intracatheter Q12H Gretta Doffing P, DO   10 mL at 04/30/24 9083   sodium chloride  flush (NS) 0.9 % injection 10-40 mL  10-40 mL Intracatheter PRN Gretta Doffing P, DO       sodium chloride  flush (NS) 0.9 % injection 3 mL  3 mL Intravenous Q12H Shahmehdi, Seyed A, MD   3 mL at 04/30/24 0916   sodium chloride  flush (NS) 0.9 % injection 3 mL  3 mL Intravenous Q12H Shahmehdi, Seyed A, MD   3 mL at 04/30/24 0915   sucralfate  (CARAFATE ) 1 GM/10ML suspension 1 g  1 g Oral TID WC & HS Cunningham, Scott E, MD   1 g at 04/30/24 1156   traZODone  (DESYREL ) tablet 25 mg  25 mg Oral QHS PRN Shahmehdi, Seyed A, MD   25 mg at 04/29/24 2129   vancomycin  (VANCOREADY) IVPB 2000 mg/400 mL  2,000 mg Intravenous Q24H Merilee Sharyne FERNS, [facility]   Stopped at 04/30/24 0108     Discharge Medications: Please see discharge summary for a list of discharge medications.  Relevant Imaging Results:  Relevant Lab Results:   Additional Information [id number]  Sherline Clack, LCSWA     "

## 2024-05-01 DIAGNOSIS — D649 Anemia, unspecified: Secondary | ICD-10-CM | POA: Diagnosis not present

## 2024-05-01 LAB — CBC WITH DIFFERENTIAL/PLATELET
Abs Immature Granulocytes: 0.06 K/uL (ref 0.00–0.07)
Basophils Absolute: 0.1 K/uL (ref 0.0–0.1)
Basophils Relative: 1 %
Eosinophils Absolute: 0.2 K/uL (ref 0.0–0.5)
Eosinophils Relative: 2 %
HCT: 25.1 % — ABNORMAL LOW (ref 36.0–46.0)
Hemoglobin: 7.6 g/dL — ABNORMAL LOW (ref 12.0–15.0)
Immature Granulocytes: 1 %
Lymphocytes Relative: 11 %
Lymphs Abs: 1.3 K/uL (ref 0.7–4.0)
MCH: 27.2 pg (ref 26.0–34.0)
MCHC: 30.3 g/dL (ref 30.0–36.0)
MCV: 90 fL (ref 80.0–100.0)
Monocytes Absolute: 0.7 K/uL (ref 0.1–1.0)
Monocytes Relative: 5 %
Neutro Abs: 10 K/uL — ABNORMAL HIGH (ref 1.7–7.7)
Neutrophils Relative %: 80 %
Platelets: 336 K/uL (ref 150–400)
RBC: 2.79 MIL/uL — ABNORMAL LOW (ref 3.87–5.11)
RDW: 18.8 % — ABNORMAL HIGH (ref 11.5–15.5)
WBC: 12.2 K/uL — ABNORMAL HIGH (ref 4.0–10.5)
nRBC: 0 % (ref 0.0–0.2)

## 2024-05-01 LAB — GLUCOSE, CAPILLARY
Glucose-Capillary: 91 mg/dL (ref 70–99)
Glucose-Capillary: 115 mg/dL — ABNORMAL HIGH (ref 70–99)

## 2024-05-01 MED ORDER — VANCOMYCIN HCL 2000 MG/400ML IV SOLN
2000.0000 mg | INTRAVENOUS | Status: DC
  Administered 2024-05-01 – 2024-05-10 (×10): 2000 mg via INTRAVENOUS
  Filled 2024-05-01 (×12): qty 400

## 2024-05-01 NOTE — Progress Notes (Signed)
 " PROGRESS NOTE    Peggy House  FMW:[id number] DOB: January 07, 1961 DOA: 04/22/2024 PCP: Nooruddin, Saad, MD   Brief Narrative:   64 year old female history of chronic anemia including iron  deficiency, AVMs, depression/anxiety, chronic right ankle wound, chronic thrombocytopenia, hereditary hemorrhagic telangiectasia (HHT), HTN, HLD, GERD, DM II, osteomyelitis of Rt fibula and tibia (has been on multiple course of antibiotics lately 12/19 was given linezolid  x 2 weeks) who presented to ED with chief complaint of progressive generalized weakness, fatigue and dizziness.  Reported she has had nosebleed and had a episode of black stool approximately 3 days ago.  She is not on any blood thinners.  She had a history of GI bleed and nosebleeds in the past.  On arrival her hemoglobin was 4.1.  She underwent EGD which was complicated by severe epistaxis at the onset of the procedure, requiring emergent intubation, nasal packing and transferred to the ICU.  EGD showed numerous gastric AVMs, 5 were treated with APC some with Hemoclip, video capsule endoscopy was also completed   She has been in and out of the ICU [facility] assumed care on 1/16 Needs 6 weeks of IV antibiotics  Pending SNF placement  Significant events:  04/22/2024: Admitted for acute blood loss anemia and GI bleed 04/23/2024: Epistaxis on endoscopy, remained intubated for airway protection 04/26/2014: Repeat epistaxis, back to ICU, given TXA 04/28/2024: Got Avastin  and IV iron   04/30/2024: Care transferred to [facility] team.  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 - Empiric antibiotics for TSS prophylaxis, supposed to be stopped on 05/01/2024 - Follow CBC closely - Follow-up outpatient with ENT specialist in [facility]  Hereditary hemorrhagic telangiectasias, POA: Off of VEGF inhibitor due to healing issues after ankle surgery.  Outpatient follow-up with ENT specialist emergency room Hematology recommended another dose of Avista on 1/28.   Essential hypertension, POA: Continue with amlodipine  and lisinopril .   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  Plan is vancomycin  for 6 weeks via Mediport end date of 06/04/24.   Outpatient follow-up with Dr. Dennise, ID on 05/06/2024  GERD, POA: Continue with Protonix   Anxiety/Depression: Continue with celexa , gabepentin and xanax .  Physical deconditioning: PT recommended SNF  Disposition: PT recommended SNF  DVT prophylaxis: SCDs Start: 04/22/24 1441     Code Status: Full Code Family Communication:  None at the bedside Status is: Inpatient Remains inpatient appropriate because: Bleeding    Subjective:  No acute events overnight. No active bleeding. Pending placement to SNF. Examination:  General exam: Appears calm and comfortable  Respiratory system: Clear to auscultation. Respiratory effort normal. Cardiovascular system: S1 & S2 heard, RRR. No JVD, murmurs, rubs, gallops or clicks. No pedal edema. Gastrointestinal system: Abdomen is nondistended, soft  and nontender. No organomegaly or masses felt. Normal bowel sounds heard. Central nervous system: Alert and oriented. No  focal neurological deficits. Extremities: Symmetric 5 x 5 power. Skin:right ankle wound dressing in place Psychiatry: Judgement and insight appear normal. Mood & affect appropriate.       Diet Orders (From admission, onward)     Start     Ordered   04/27/24 1504  Diet regular Room service appropriate? Yes; Fluid consistency: Thin  Diet effective now       Question Answer Comment  Room service appropriate? Yes   Fluid consistency: Thin      04/27/24 1503            Objective: Vitals:   05/01/24 0443 05/01/24 0500 05/01/24 0523 05/01/24 0801  BP: (!) 129/59  (!) 119/58 139/70  Pulse: 65  62 60  Resp: 18  17 18   Temp: 98.1 F (36.7 C)  99.2 F (37.3 C) 98.2 F (36.8 C)  TempSrc:      SpO2: 100%  97% 99%  Weight:  116.3 kg    Height:        Intake/Output Summary (Last 24 hours) at 05/01/2024 0859 Last data filed at 05/01/2024 0518 Gross per 24 hour  Intake 447.11 ml  Output 400 ml  Net 47.11 ml   Filed Weights   04/29/24 0500 04/30/24 0500 05/01/24 0500  Weight: 111.2 kg 114.7 kg 116.3 kg    Scheduled Meds:  aminocaproic  acid  1 g Oral Q8H   amLODipine   10 mg Oral Daily   amoxicillin   500 mg Oral Q12H   Chlorhexidine  Gluconate Cloth  6 each Topical Daily   citalopram   20 mg Oral Daily   gabapentin   100 mg Oral QHS   lisinopril   20 mg Oral Daily   pantoprazole   40 mg Oral BID   polyethylene glycol  17 g Oral Daily   senna  1 tablet Oral BID   sodium chloride  flush  10-40 mL Intracatheter Q12H   sodium chloride  flush  3 mL Intravenous Q12H   sodium chloride  flush  3 mL Intravenous Q12H   sucralfate   1 g Oral TID WC & HS   Continuous Infusions:  vancomycin       Nutritional status     Body mass index is 44.01 kg/m.  Data Reviewed:   CBC: Recent Labs  Lab 04/26/24 0349 04/26/24 2244 04/28/24 0524 04/28/24 2227 04/29/24 0516 04/30/24 1015 05/01/24 0401  WBC 10.3  --   --  11.9* 11.2* 11.1* 12.2*  NEUTROABS  --   --   --   --   --   --   10.0*  HGB 8.0*   < > 6.5* 7.6* 7.3* 7.5* 7.6*  HCT 26.3*   < > 21.7* 25.7* 24.3* 24.4* 25.1*  MCV 89.2  --   --  88.0 89.0 88.1 90.0  PLT 332  --   --  347 302 323 336   < > = values in this interval not displayed.   Basic Metabolic Panel: Recent Labs  Lab 04/25/24 0424 04/26/24 0349 04/27/24 0444 04/30/24 1015  NA 139 142 143 139  K 3.5 4.1 4.2 3.7  CL 109 110 109 106  CO2 23 23 27 24   GLUCOSE 90 125* 120* 122*  BUN <5* 7* 8 10  CREATININE 0.69 0.73 0.69 0.75  CALCIUM  7.9* 8.4* 8.4* 8.5*   GFR: Estimated Creatinine Clearance: 90.1 mL/min (by C-G formula based on SCr of 0.75 mg/dL). Liver  Function Tests: No results for input(s): AST, ALT, ALKPHOS, BILITOT, PROT, ALBUMIN in the last 168 hours. No results for input(s): LIPASE, AMYLASE in the last 168 hours. No results for input(s): AMMONIA in the last 168 hours. Coagulation Profile: No results for input(s): INR, PROTIME in the last 168 hours.  Cardiac Enzymes: No results for input(s): CKTOTAL, CKMB, CKMBINDEX, TROPONINI in the last 168 hours. BNP (last 3 results) No results for input(s): PROBNP in the last 8760 hours. HbA1C: No results for input(s): HGBA1C in the last 72 hours. CBG: Recent Labs  Lab 04/26/24 0804 04/27/24 0807 04/29/24 0758 04/30/24 0817 05/01/24 0805  GLUCAP 134* 109* 102* 131* 91   Lipid Profile: No results for input(s): CHOL, HDL, LDLCALC, TRIG, CHOLHDL, LDLDIRECT in the last 72 hours. Thyroid Function Tests: No results for input(s): TSH, T4TOTAL, FREET4, T3FREE, THYROIDAB in the last 72 hours. Anemia Panel: No results for input(s): VITAMINB12, FOLATE, FERRITIN, TIBC, IRON , RETICCTPCT in the last 72 hours. Sepsis Labs: No results for input(s): PROCALCITON, LATICACIDVEN in the last 168 hours.  No results found for this or any previous visit (from the past 240 hours).       Radiology Studies: No results  found.         LOS: 9 days   Time spent= 35 mins    Deliliah Room, MD [facility]  If 7PM-7AM, please contact night-coverage  05/01/2024, 8:59 AM  "

## 2024-05-01 NOTE — Plan of Care (Signed)
" °  Problem: Education: Goal: Knowledge of General Education information will improve Description: Including pain rating scale, medication(s)/side effects and non-pharmacologic comfort measures Outcome: Progressing   Problem: Clinical Measurements: Goal: Ability to maintain clinical measurements within normal limits will improve Outcome: Progressing   Problem: Activity: Goal: Risk for activity intolerance will decrease Outcome: Progressing   Problem: Nutrition: Goal: Adequate nutrition will be maintained Outcome: Progressing   Problem: Coping: Goal: Level of anxiety will decrease Outcome: Progressing   Problem: Elimination: Goal: Will not experience complications related to bowel motility Outcome: Progressing   Problem: Pain Managment: Goal: General experience of comfort will improve and/or be controlled Outcome: Progressing   Problem: Skin Integrity: Goal: Risk for impaired skin integrity will decrease Outcome: Progressing   Problem: Activity: Goal: Ability to tolerate increased activity will improve Outcome: Progressing   "

## 2024-05-01 NOTE — Plan of Care (Signed)
  Problem: Education: Goal: Knowledge of General Education information will improve Description: Including pain rating scale, medication(s)/side effects and non-pharmacologic comfort measures Outcome: Progressing   Problem: Health Behavior/Discharge Planning: Goal: Ability to manage health-related needs will improve Outcome: Progressing   Problem: Clinical Measurements: Goal: Ability to maintain clinical measurements within normal limits will improve Outcome: Progressing Goal: Will remain free from infection Outcome: Progressing Goal: Diagnostic test results will improve Outcome: Progressing Goal: Respiratory complications will improve Outcome: Progressing Goal: Cardiovascular complication will be avoided Outcome: Progressing   Problem: Activity: Goal: Risk for activity intolerance will decrease Outcome: Progressing   Problem: Nutrition: Goal: Adequate nutrition will be maintained Outcome: Progressing   Problem: Coping: Goal: Level of anxiety will decrease Outcome: Progressing   Problem: Elimination: Goal: Will not experience complications related to bowel motility Outcome: Progressing Goal: Will not experience complications related to urinary retention Outcome: Progressing   Problem: Pain Managment: Goal: General experience of comfort will improve and/or be controlled Outcome: Progressing   Problem: Safety: Goal: Ability to remain free from injury will improve Outcome: Progressing   Problem: Skin Integrity: Goal: Risk for impaired skin integrity will decrease Outcome: Progressing   Problem: Activity: Goal: Ability to tolerate increased activity will improve Outcome: Progressing   Problem: Respiratory: Goal: Ability to maintain a clear airway and adequate ventilation will improve Outcome: Progressing   Problem: Role Relationship: Goal: Method of communication will improve Outcome: Progressing

## 2024-05-02 DIAGNOSIS — D649 Anemia, unspecified: Secondary | ICD-10-CM | POA: Diagnosis not present

## 2024-05-02 LAB — HEMOGLOBIN AND HEMATOCRIT, BLOOD
HCT: 23.9 % — ABNORMAL LOW (ref 36.0–46.0)
Hemoglobin: 7.1 g/dL — ABNORMAL LOW (ref 12.0–15.0)

## 2024-05-02 LAB — GLUCOSE, CAPILLARY
Glucose-Capillary: 110 mg/dL — ABNORMAL HIGH (ref 70–99)
Glucose-Capillary: 118 mg/dL — ABNORMAL HIGH (ref 70–99)

## 2024-05-02 MED ORDER — OXYMETAZOLINE HCL 0.05 % NA SOLN
1.0000 | Freq: Two times a day (BID) | NASAL | Status: AC
  Administered 2024-05-02 – 2024-05-04 (×4): 1 via NASAL
  Filled 2024-05-02: qty 30

## 2024-05-02 NOTE — Progress Notes (Signed)
 Pt started having a severe nose bleed from both nairs while in the room @04 :15 PM, applied pressure and tucked chin. Took about an hour and half with icing to stop the bleeding. Notified physician. H&H ordered and ENT considered. Afrin nasal spray was already give before nose bleed got worse due to her already having periodic bleeding from both nairs, mainly on the left side. @1830  no bleeding is present.

## 2024-05-02 NOTE — Plan of Care (Signed)

## 2024-05-02 NOTE — Plan of Care (Signed)
" °  Problem: Education: Goal: Knowledge of General Education information will improve Description: Including pain rating scale, medication(s)/side effects and non-pharmacologic comfort measures Outcome: Progressing   Problem: Clinical Measurements: Goal: Ability to maintain clinical measurements within normal limits will improve Outcome: Progressing   Problem: Activity: Goal: Risk for activity intolerance will decrease Outcome: Progressing   Problem: Nutrition: Goal: Adequate nutrition will be maintained Outcome: Progressing   Problem: Coping: Goal: Level of anxiety will decrease Outcome: Progressing   Problem: Elimination: Goal: Will not experience complications related to bowel motility Outcome: Progressing   Problem: Pain Managment: Goal: General experience of comfort will improve and/or be controlled Outcome: Progressing   Problem: Safety: Goal: Ability to remain free from injury will improve Outcome: Progressing   Problem: Skin Integrity: Goal: Risk for impaired skin integrity will decrease Outcome: Progressing   Problem: Activity: Goal: Ability to tolerate increased activity will improve Outcome: Progressing   "

## 2024-05-02 NOTE — Progress Notes (Signed)
 " PROGRESS NOTE    Peggy House  FMW:[id number] DOB: 1961-03-03 DOA: 04/22/2024 PCP: Nooruddin, Saad, MD   Brief Narrative:   64 year old female history of chronic anemia including iron  deficiency, AVMs, depression/anxiety, chronic right ankle wound, chronic thrombocytopenia, hereditary hemorrhagic telangiectasia (HHT), HTN, HLD, GERD, DM II, osteomyelitis of Rt fibula and tibia (has been on multiple course of antibiotics lately 12/19 was given linezolid  x 2 weeks) who presented to ED with chief complaint of progressive generalized weakness, fatigue and dizziness.  Reported she has had nosebleed and had a episode of black stool approximately 3 days ago.  She is not on any blood thinners.  She had a history of GI bleed and nosebleeds in the past.  On arrival her hemoglobin was 4.1.  She underwent EGD which was complicated by severe epistaxis at the onset of the procedure, requiring emergent intubation, nasal packing and transferred to the ICU.  EGD showed numerous gastric AVMs, 5 were treated with APC some with Hemoclip, video capsule endoscopy was also completed   She has been in and out of the ICU [facility] assumed care on 1/16 Needs 6 weeks of IV antibiotics  Pending SNF placement  Significant events:  04/22/2024: Admitted for acute blood loss anemia and GI bleed 04/23/2024: Epistaxis on endoscopy, remained intubated for airway protection 04/26/2014: Repeat epistaxis, back to ICU, given TXA 04/28/2024: Got Avastin  and IV iron   04/30/2024: Care transferred to [facility] team.  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 - Empiric antibiotics for TSS prophylaxis, supposed to be stopped on 05/01/2024 - Follow CBC closely - Follow-up outpatient with ENT specialist in [facility]  Hereditary hemorrhagic telangiectasias, POA: Off of VEGF inhibitor due to healing issues after ankle surgery.  Outpatient follow-up with ENT specialist emergency room Hematology recommended another dose of Avista on 1/28.   Essential hypertension, POA: Continue with amlodipine  and lisinopril .   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  Plan is vancomycin  for 6 weeks via Mediport end date of 06/04/24.   Outpatient follow-up with Dr. Dennise, ID on 05/06/2024  GERD, POA: Continue with Protonix   Anxiety/Depression: Continue with celexa , gabepentin and xanax .  Physical deconditioning: PT recommended SNF  Disposition: PT recommended SNF  DVT prophylaxis: SCDs Start: 04/22/24 1441     Code Status: Full Code Family Communication:  None at the bedside Status is: Inpatient Remains inpatient appropriate because: Bleeding    Subjective:  No acute events overnight. No active bleeding. Pending placement to SNF.   Examination:  General exam: Appears calm and comfortable  Respiratory system: Clear to auscultation. Respiratory effort normal. Cardiovascular system: S1 & S2 heard, RRR. No JVD, murmurs, rubs, gallops or clicks. No pedal edema. Gastrointestinal system: Abdomen is  nondistended, soft and nontender. No organomegaly or masses felt. Normal bowel sounds heard. Central nervous system: Alert and oriented. No  focal neurological deficits. Extremities: Symmetric 5 x 5 power. Skin:right ankle wound dressing in place Psychiatry: Judgement and insight appear normal. Mood & affect appropriate.       Diet Orders (From admission, onward)     Start     Ordered   04/27/24 1504  Diet regular Room service appropriate? Yes; Fluid consistency: Thin  Diet effective now       Question Answer Comment  Room service appropriate? Yes   Fluid consistency: Thin      04/27/24 1503            Objective: Vitals:   05/02/24 0022 05/02/24 0436 05/02/24 0439 05/02/24 0827  BP: (!) 117/57  130/62 129/65  Pulse: 74  68 61  Resp: 18  18   Temp: 99.6 F (37.6 C)  98.5 F (36.9 C) 98.5 F (36.9 C)  TempSrc: Oral     SpO2: 99%  97% 99%  Weight:  117.7 kg    Height:        Intake/Output Summary (Last 24 hours) at 05/02/2024 0916 Last data filed at 05/02/2024 0400 Gross per 24 hour  Intake --  Output 1800 ml  Net -1800 ml   Filed Weights   04/30/24 0500 05/01/24 0500 05/02/24 0436  Weight: 114.7 kg 116.3 kg 117.7 kg    Scheduled Meds:  aminocaproic  acid  1 g Oral Q8H   amLODipine   10 mg Oral Daily   Chlorhexidine  Gluconate Cloth  6 each Topical Daily   citalopram   20 mg Oral Daily   gabapentin   100 mg Oral QHS   lisinopril   20 mg Oral Daily   pantoprazole   40 mg Oral BID   polyethylene glycol  17 g Oral Daily   senna  1 tablet Oral BID   sodium chloride  flush  10-40 mL Intracatheter Q12H   sodium chloride  flush  3 mL Intravenous Q12H   sodium chloride  flush  3 mL Intravenous Q12H   sucralfate   1 g Oral TID WC & HS   Continuous Infusions:  vancomycin  2,000 mg (05/01/24 2316)    Nutritional status     Body mass index is 44.54 kg/m.  Data Reviewed:   CBC: Recent Labs  Lab 04/26/24 0349 04/26/24 2244 04/28/24 0524 04/28/24 2227 04/29/24 0516 04/30/24 1015 05/01/24 0401  WBC 10.3  --   --  11.9* 11.2* 11.1* 12.2*  NEUTROABS  --   --   --   --   --   --  10.0*  HGB 8.0*   <  > 6.5* 7.6* 7.3* 7.5* 7.6*  HCT 26.3*   < > 21.7* 25.7* 24.3* 24.4* 25.1*  MCV 89.2  --   --  88.0 89.0 88.1 90.0  PLT 332  --   --  347 302 323 336   < > = values in this interval not displayed.   Basic Metabolic Panel: Recent Labs  Lab 04/26/24 0349 04/27/24 0444 04/30/24 1015  NA 142 143 139  K 4.1 4.2 3.7  CL 110 109 106  CO2 23 27 24   GLUCOSE 125* 120* 122*  BUN 7* 8 10  CREATININE 0.73 0.69 0.75  CALCIUM  8.4* 8.4* 8.5*   GFR: Estimated Creatinine Clearance: 90.8 mL/min (by C-G formula based on SCr of 0.75 mg/dL). Liver Function Tests: No results for input(s): AST, ALT, ALKPHOS, BILITOT, PROT, ALBUMIN in the last 168  hours. No results for input(s): LIPASE, AMYLASE in the last 168 hours. No results for input(s): AMMONIA in the last 168 hours. Coagulation Profile: No results for input(s): INR, PROTIME in the last 168 hours.  Cardiac Enzymes: No results for input(s): CKTOTAL, CKMB, CKMBINDEX, TROPONINI in the last 168 hours. BNP (last 3 results) No results for input(s): PROBNP in the last 8760 hours. HbA1C: No results for input(s): HGBA1C in the last 72 hours. CBG: Recent Labs  Lab 04/29/24 0758 04/30/24 0817 05/01/24 0805 05/01/24 2020 05/02/24 0825  GLUCAP 102* 131* 91 115* 110*   Lipid Profile: No results for input(s): CHOL, HDL, LDLCALC, TRIG, CHOLHDL, LDLDIRECT in the last 72 hours. Thyroid Function Tests: No results for input(s): TSH, T4TOTAL, FREET4, T3FREE, THYROIDAB in the last 72 hours. Anemia Panel: No results for input(s): VITAMINB12, FOLATE, FERRITIN, TIBC, IRON , RETICCTPCT in the last 72 hours. Sepsis Labs: No results for input(s): PROCALCITON, LATICACIDVEN in the last 168 hours.  No results found for this or any previous visit (from the past 240 hours).       Radiology Studies: No results found.         LOS: 10 days   Time spent= 35 mins    Deliliah Room, MD [facility]  If 7PM-7AM, please contact night-coverage  05/02/2024, 9:16 AM  "

## 2024-05-02 NOTE — Progress Notes (Signed)
 patient had another slight nose bleed in Left nair, no longer bleeding. Notified physician.

## 2024-05-03 DIAGNOSIS — D649 Anemia, unspecified: Secondary | ICD-10-CM | POA: Diagnosis not present

## 2024-05-03 LAB — CBC WITH DIFFERENTIAL/PLATELET
Abs Immature Granulocytes: 0.05 K/uL (ref 0.00–0.07)
Basophils Absolute: 0 K/uL (ref 0.0–0.1)
Basophils Relative: 0 %
Eosinophils Absolute: 0.2 K/uL (ref 0.0–0.5)
Eosinophils Relative: 2 %
HCT: 24.3 % — ABNORMAL LOW (ref 36.0–46.0)
Hemoglobin: 7 g/dL — ABNORMAL LOW (ref 12.0–15.0)
Immature Granulocytes: 1 %
Lymphocytes Relative: 11 %
Lymphs Abs: 1.2 K/uL (ref 0.7–4.0)
MCH: 26.4 pg (ref 26.0–34.0)
MCHC: 28.8 g/dL — ABNORMAL LOW (ref 30.0–36.0)
MCV: 91.7 fL (ref 80.0–100.0)
Monocytes Absolute: 0.7 K/uL (ref 0.1–1.0)
Monocytes Relative: 7 %
Neutro Abs: 8.8 K/uL — ABNORMAL HIGH (ref 1.7–7.7)
Neutrophils Relative %: 79 %
Platelets: 329 K/uL (ref 150–400)
RBC: 2.65 MIL/uL — ABNORMAL LOW (ref 3.87–5.11)
RDW: 18.6 % — ABNORMAL HIGH (ref 11.5–15.5)
WBC: 11 K/uL — ABNORMAL HIGH (ref 4.0–10.5)
nRBC: 0 % (ref 0.0–0.2)

## 2024-05-03 LAB — BASIC METABOLIC PANEL WITH GFR
Anion gap: 9 (ref 5–15)
BUN: 11 mg/dL (ref 8–23)
CO2: 25 mmol/L (ref 22–32)
Calcium: 8.3 mg/dL — ABNORMAL LOW (ref 8.9–10.3)
Chloride: 105 mmol/L (ref 98–111)
Creatinine, Ser: 0.83 mg/dL (ref 0.44–1.00)
GFR, Estimated: >60 mL/min
Glucose, Bld: 116 mg/dL — ABNORMAL HIGH (ref 70–99)
Potassium: 3.8 mmol/L (ref 3.5–5.1)
Sodium: 139 mmol/L (ref 135–145)

## 2024-05-03 LAB — GLUCOSE, CAPILLARY: Glucose-Capillary: 98 mg/dL (ref 70–99)

## 2024-05-03 LAB — VANCOMYCIN, RANDOM: Vancomycin Rm: 17 ug/mL

## 2024-05-03 LAB — VANCOMYCIN, PEAK: Vancomycin Pk: 39 ug/mL (ref 30–40)

## 2024-05-03 NOTE — Progress Notes (Signed)
 Pharmacy Antibiotic Note  Peggy House is a 64 y.o. female admitted on 04/22/2024 with a chief complaint of generalized weakness/fatigue/dizziness, nosebleed, and episode of black stool.Pharmacy has been consulted for vancomycin  dosing. Infectious workup is for osteomyelitis. Plan for 6 weeks IV vancomycin  - stop date 2/20.  Vanc peak 39 (0103), Vanc Random 17 (1411)>>Calculated AUC 574, Cmin ~11. AUC on the higher side, though alternative recommended dose would be 1750 mg IV Q24H which would yield an AUC of 502, but Cmin of <10. Scr remains stable.   Plan: Continue Vancomycin  to 2000mg  IV daily   Monitor renal function Consider levels at Css on new dose   Height: 5' 4 (162.6 cm) Weight: 121.9 kg (268 lb 12.8 oz) IBW/kg (Calculated) : 54.7  Temp (24hrs), Avg:98.5 F (36.9 C), Min:97.8 F (36.6 C), Max:99 F (37.2 C)  Recent Labs  Lab 04/26/24 2244 04/27/24 0444 04/27/24 1502 04/28/24 2227 04/29/24 0516 04/30/24 1015 05/01/24 0401 05/03/24 0103 05/03/24 1411  WBC  --   --   --  11.9* 11.2* 11.1* 12.2* 11.0*  --   CREATININE  --  0.69  --   --   --  0.75  --  0.83  --   VANCOTROUGH  --   --  7*  --   --   --   --   --   --   VANCOPEAK 18*  --   --   --   --   --   --  6  --   VANCORANDOM  --   --   --   --   --   --   --   --  17    Estimated Creatinine Clearance: 89.4 mL/min (by C-G formula based on SCr of 0.83 mg/dL).    Allergies[1]  Antimicrobials this admission: Linezolid  1/8>>1/9 Vancomycin  1/9 >> [06/04/2024 per ID]  Microbiology results: 03/16/24 surgical culture from right leg wound = Corynebacterium striatum  Thank you for allowing pharmacy to be a part of this patients care.  Massie Fila, PharmD Clinical Pharmacist  05/03/2024 3:25 PM         [1]  Allergies Allergen Reactions   Feraheme  [Ferumoxytol ] Other (See Comments)    Muscle tetany, encephalopathy, fatigue, nausea (reaction to injection)   Nsaids Other (See Comments)     Stomach bleeding episodes; Tylenol  is OK   Wasp Venom Anaphylaxis   Tomato Hives   Iron  (Ferrous Sulfate ) [Ferrous Sulfate  Er] Other (See Comments)    Shock

## 2024-05-03 NOTE — Progress Notes (Signed)
 Physical Therapy Treatment Patient Details Name: Peggy House MRN: [id number] DOB: [birth year] Today's Date: 05/03/2024   History of Present Illness 64 y.o. female admitted 04/22/24 with DOE, fatigue, several nosebleeds, black tarry stools. Workup for anemia, GIB, significant epistaxis with bloody aspiration. ETT 1/9-1/10. PMH includes hereditary hemorrhagic telangiectasia (off VEG-F inhibitor due to healing issues after ankle sx), multiple R ankle sxs with impaired healing (04/2023-07/2023), HTN, DM2, GIB, tobacco use.    PT Comments  Pt received in supine and agreeable to session. Pt able to perform bed mobility and transfers with min-mod A and increased time due to R ankle pain. Pt demonstrates improved stability while stepping to the recliner, but has limited RLE WB tolerance. Pt able to perform additional stand and static standing marches with focus on upright posture and improved B feet clearance. RLE external rotation noted and education provided on improved positioning at rest. Pt continues to benefit from PT services to progress toward functional mobility goals.     If plan is discharge home, recommend the following: A little help with walking and/or transfers;A little help with bathing/dressing/bathroom;Assistance with cooking/housework;Assist for transportation;Help with stairs or ramp for entrance   Can travel by private vehicle     Yes  Equipment Recommendations  None recommended by PT    Recommendations for Other Services       Precautions / Restrictions Precautions Precautions: Fall;Other (comment) Recall of Precautions/Restrictions: Intact Precaution/Restrictions Comments: h/o R ankle injury with multiple sxs (wound currently wrapped in gauze) Restrictions Weight Bearing Restrictions Per Provider Order: No     Mobility  Bed Mobility Overal bed mobility: Needs Assistance Bed Mobility: Supine to Sit     Supine to sit: Min assist, HOB elevated, Used rails      General bed mobility comments: Exit to the L with increased difficulty reaching across with RUE to rail requiring HHA for trunk elevation. Increased time and cues for scooting forward to EOB    Transfers Overall transfer level: Needs assistance Equipment used: Rolling walker (2 wheels) Transfers: Sit to/from Stand Sit to Stand: Mod assist, Min assist   Step pivot transfers: Min assist       General transfer comment: STS from EOB with mod A and recliner with min A. Cues for hand and foot placement. Increased time for rise and transitioning hands to RW. Multimodal cues for upright posture, but pt maintaining flexed trunk. Slow, effortful steps to recliner with low foot clearance    Ambulation/Gait             Pre-gait activities: static standing marching     Stairs             Wheelchair Mobility     Tilt Bed    Modified Rankin (Stroke Patients Only)       Balance Overall balance assessment: Needs assistance Sitting-balance support: No upper extremity supported, Feet supported Sitting balance-Leahy Scale: Fair Sitting balance - Comments: EOB   Standing balance support: Reliant on assistive device for balance, Bilateral upper extremity supported, During functional activity Standing balance-Leahy Scale: Poor Standing balance comment: Reliant on BUE support                            Communication Communication Communication: No apparent difficulties  Cognition Arousal: Alert Behavior During Therapy: WFL for tasks assessed/performed   PT - Cognitive impairments: No apparent impairments  Following commands: Intact      Cueing Cueing Techniques: Verbal cues  Exercises General Exercises - Lower Extremity Long Arc Quad: AROM, Seated, Both, 10 reps Hip Flexion/Marching: AROM, Standing, Both, 10 reps    General Comments        Pertinent Vitals/Pain Pain Assessment Pain Assessment: 0-10 Pain Score: 8   Pain Location: R ankle Pain Descriptors / Indicators: Discomfort, Grimacing Pain Intervention(s): Limited activity within patient's tolerance, Monitored during session, Repositioned     PT Goals (current goals can now be found in the care plan section) Acute Rehab PT Goals Patient Stated Goal: to return home PT Goal Formulation: With patient Time For Goal Achievement: 05/09/24 Progress towards PT goals: Progressing toward goals    Frequency    Min 2X/week       AM-PAC PT 6 Clicks Mobility   Outcome Measure  Help needed turning from your back to your side while in a flat bed without using bedrails?: A Little Help needed moving from lying on your back to sitting on the side of a flat bed without using bedrails?: A Little Help needed moving to and from a bed to a chair (including a wheelchair)?: A Little Help needed standing up from a chair using your arms (e.g., wheelchair or bedside chair)?: A Little Help needed to walk in hospital room?: Total (>71ft) Help needed climbing 3-5 steps with a railing? : Total 6 Click Score: 14    End of Session Equipment Utilized During Treatment: Gait belt Activity Tolerance: Patient tolerated treatment well;Patient limited by pain Patient left: with call bell/phone within reach;in chair Nurse Communication: Mobility status PT Visit Diagnosis: Other abnormalities of gait and mobility (R26.89);Muscle weakness (generalized) (M62.81);Pain Pain - Right/Left: Right Pain - part of body: Ankle and joints of foot     Time: 9093-9065 PT Time Calculation (min) (ACUTE ONLY): 28 min  Charges:    $Therapeutic Exercise: 8-22 mins $Therapeutic Activity: 8-22 mins PT General Charges $$ ACUTE PT VISIT: 1 Visit                    Darryle George, PTA Acute Rehabilitation Services Secure Chat Preferred  [phone]    Darryle George 05/03/2024, 10:11 AM

## 2024-05-03 NOTE — Progress Notes (Signed)
 " PROGRESS NOTE    Peggy House  FMW:[id number] DOB: [birth year] DOA: 04/22/2024 PCP: Nooruddin, Saad, MD   Brief Narrative:   64 year old female history of chronic anemia including iron  deficiency, AVMs, depression/anxiety, chronic right ankle wound, chronic thrombocytopenia, hereditary hemorrhagic telangiectasia (HHT), HTN, HLD, GERD, DM II, osteomyelitis of Rt fibula and tibia (has been on multiple course of antibiotics lately 12/19 was given linezolid  x 2 weeks) who presented to ED with chief complaint of progressive generalized weakness, fatigue and dizziness.  Reported she has had nosebleed and had a episode of black stool approximately 3 days ago.  She is not on any blood thinners.  She had a history of GI bleed and nosebleeds in the past.  On arrival her hemoglobin was 4.1.  She underwent EGD which was complicated by severe epistaxis at the onset of the procedure, requiring emergent intubation, nasal packing and transferred to the ICU.  EGD showed numerous gastric AVMs, 5 were treated with APC some with Hemoclip, video capsule endoscopy was also completed   She has been in and out of the ICU [facility] assumed care on 1/16 Needs 6 weeks of IV antibiotics  Pending SNF placement  Significant events:  04/22/2024: Admitted for acute blood loss anemia and GI bleed 04/23/2024: Epistaxis on endoscopy, remained intubated for airway protection 04/26/2014: Repeat epistaxis, back to ICU, given TXA 04/28/2024: Got Avastin  and IV iron   04/30/2024: Care transferred to [facility] team.  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   Epistaxis   AVM (arteriovenous malformation) of small bowel, acquired    Essential hypertension   Diabetes mellitus (HCC)   ABLA (acute blood loss anemia)   Osteomyelitis (HCC)   On mechanically assisted ventilation (HCC)   Acute respiratory failure with hypoxia (HCC)   Nodule of intestine   Severe epistaxis, acute blood loss anemia, POA: -Received Avastin  04/28/2024 along with IV iron  - She is having intermittent epistaxis episodes but self resolving - Empiric antibiotics for TSS prophylaxis, supposed to be stopped on 05/01/2024 - Follow CBC closely - Follow-up outpatient with ENT specialist in [facility]  Hereditary hemorrhagic telangiectasias, POA: Off of VEGF inhibitor due to healing issues after ankle surgery.  Outpatient follow-up with ENT specialist emergency room Hematology recommended another dose of Avista on 1/28.   Essential hypertension, POA: Continue with amlodipine  and lisinopril .   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  Plan is vancomycin  for 6 weeks via Mediport end date of 06/04/24.   Outpatient follow-up with Dr. Dennise, ID on 05/06/2024  GERD, POA: Continue with Protonix   Anxiety/Depression: Continue with celexa , gabepentin and xanax .  Physical deconditioning: PT recommended SNF  Disposition: PT recommended SNF  DVT prophylaxis: SCDs Start: 04/22/24 1441     Code Status: Full Code Family Communication:  None at the bedside Status is: Inpatient Remains inpatient appropriate because: Bleeding    Subjective:  She did have couple of episodes of epistaxis yesterday evening. Afrin nasal spray was utilized along with with firm manual pressure and bleeding eventually stopped. Awaiting SNF placement.   Examination:  General exam: Appears calm and comfortable  Respiratory system: Clear to auscultation. Respiratory effort normal. Cardiovascular system: S1 &  S2 heard, RRR. No JVD, murmurs, rubs, gallops or clicks. No pedal edema. Gastrointestinal system: Abdomen is  nondistended, soft and nontender. No organomegaly or masses felt. Normal bowel sounds heard. Central nervous system: Alert and oriented. No focal neurological deficits. Extremities: Symmetric 5 x 5 power. Skin:right ankle wound dressing in place Psychiatry: Judgement and insight appear normal. Mood & affect appropriate.       Diet Orders (From admission, onward)     Start     Ordered   04/27/24 1504  Diet regular Room service appropriate? Yes; Fluid consistency: Thin  Diet effective now       Question Answer Comment  Room service appropriate? Yes   Fluid consistency: Thin      04/27/24 1503            Objective: Vitals:   05/02/24 2346 05/03/24 0500 05/03/24 0513 05/03/24 0822  BP: (!) 141/62  (!) 133/59 (!) 131/59  Pulse: 64  (!) 57 (!) 57  Resp: 18  18   Temp: 98.5 F (36.9 C)  98.8 F (37.1 C) 99 F (37.2 C)  TempSrc: Oral     SpO2: 99%  96% 97%  Weight:  121.9 kg    Height:       No intake or output data in the 24 hours ending 05/03/24 0951  Filed Weights   05/01/24 0500 05/02/24 0436 05/03/24 0500  Weight: 116.3 kg 117.7 kg 121.9 kg    Scheduled Meds:  aminocaproic  acid  1 g Oral Q8H   amLODipine   10 mg Oral Daily   Chlorhexidine  Gluconate Cloth  6 each Topical Daily   citalopram   20 mg Oral Daily   gabapentin   100 mg Oral QHS   lisinopril   20 mg Oral Daily   oxymetazoline   1 spray Each Nare BID   pantoprazole   40 mg Oral BID   polyethylene glycol  17 g Oral Daily   senna  1 tablet Oral BID   sodium chloride  flush  10-40 mL Intracatheter Q12H   sodium chloride  flush  3 mL Intravenous Q12H   sodium chloride  flush  3 mL Intravenous Q12H   sucralfate   1 g Oral TID WC & HS   Continuous Infusions:  vancomycin  2,000 mg (05/02/24 2115)    Nutritional status     Body mass index is 46.14 kg/m.  Data Reviewed:   CBC: Recent Labs  Lab 04/28/24 2227 04/29/24 0516 04/30/24 1015 05/01/24 0401 05/02/24 1655 05/03/24 0103  WBC 11.9* 11.2*  11.1* 12.2*  --  11.0*  NEUTROABS  --   --   --  10.0*  --  8.8*  HGB 7.6* 7.3* 7.5* 7.6* 7.1* 7.0*  HCT 25.7* 24.3* 24.4* 25.1* 23.9* 24.3*  MCV 88.0 89.0 88.1 90.0  --  91.7  PLT 347 302 323 336  --  329   Basic Metabolic Panel: Recent Labs  Lab 04/27/24 0444 04/30/24 1015 05/03/24 0103  NA 143 139 139  K 4.2 3.7 3.8  CL 109 106 105  CO2 27 24 25   GLUCOSE 120* 122* 116*  BUN 8 10 11   CREATININE 0.69 0.75 0.83  CALCIUM  8.4* 8.5* 8.3*   GFR: Estimated Creatinine Clearance: 89.4 mL/min (by C-G formula based on SCr of 0.83 mg/dL). Liver Function Tests: No results for input(s): AST, ALT, ALKPHOS, BILITOT, PROT, ALBUMIN in the last 168 hours. No results for input(s): LIPASE, AMYLASE in the last 168 hours. No results for input(s): AMMONIA in the last 168 hours. Coagulation Profile:  No results for input(s): INR, PROTIME in the last 168 hours.  Cardiac Enzymes: No results for input(s): CKTOTAL, CKMB, CKMBINDEX, TROPONINI in the last 168 hours. BNP (last 3 results) No results for input(s): PROBNP in the last 8760 hours. HbA1C: No results for input(s): HGBA1C in the last 72 hours. CBG: Recent Labs  Lab 05/01/24 0805 05/01/24 2020 05/02/24 0825 05/02/24 1948 05/03/24 0821  GLUCAP 91 115* 110* 118* 98   Lipid Profile: No results for input(s): CHOL, HDL, LDLCALC, TRIG, CHOLHDL, LDLDIRECT in the last 72 hours. Thyroid Function Tests: No results for input(s): TSH, T4TOTAL, FREET4, T3FREE, THYROIDAB in the last 72 hours. Anemia Panel: No results for input(s): VITAMINB12, FOLATE, FERRITIN, TIBC, IRON , RETICCTPCT in the last 72 hours. Sepsis Labs: No results for input(s): PROCALCITON, LATICACIDVEN in the last 168 hours.  No results found for this or any previous visit (from the past 240 hours).       Radiology Studies: No results found.         LOS: 11 days   Time spent= 35  mins    Deliliah Room, MD [facility]  If 7PM-7AM, please contact night-coverage  05/03/2024, 9:51 AM  "

## 2024-05-03 NOTE — Progress Notes (Signed)
 Pt Left nair started bleeding, not excessively but starting up how it did yesterday were she wiping her nose every few mins. Physician notified. No new orders at this time.

## 2024-05-03 NOTE — Progress Notes (Addendum)
 Peggy House   DOB:13-May-1960   FM#:[id number]      CLINICAL SUMMARY:  Peggy House is a 64 year old female patient  admitted 04/22/2024 with complaints of generalized weakness, fatigue and dizziness. Reported nosebleed 3 days prior with episode of black stool. 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 Medical hematology following closely.      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  Required multiple transfusions and APC's since 2019. - Extensive workup confirmed HHT based on personal and family history. - Status post frequent blood transfusions, IV iron  and bevacizumab  with good response.  However patient has been noncompliant with outpatient appointments.    ASSESSMENT & PLAN:  HHT (hereditary hemorrhagic telangiectasia) Severe epistaxis Recurrent GI bleeding - She was admitted 04/22/2024 with complaints of weakness, fatigue and dizziness.  Noted to have a large amount of nosebleeds 04/26/2024.  Seen by ENT with nares packed. - Noted in documentation that patient again had severe epistaxis overnight and earlier today. - Status post bevacizumab  04/28/2024 which was tolerated well.  Next dosage is due 05/12/2024.  Patient aware and informed and instructed to follow-up at [facility] even if she is at rehab or nursing facility next week.  Agrees to do so. - Continue Amicar  as ordered - Medical hematology/Dr. Lanny following closely  Anemia Iron  deficiency anemia - Likely related to chronic epistaxis and GI bleeding - Hemoglobin low 7.0. - Status post IV iron  infusions - Recommend PRBC transfusion for hemoglobin less than 7.0 - Continue to monitor CBC with differential  RLE chronic wound - Status post removal of hardware with I&D - Continue Vanco x 6 weeks - Continue supportive care    Code Status Full  Subjective:  Patient seen awake and  alert sitting in chair at bedside.  States that she had some mild bleeding earlier today which lasted approximately 15 minutes, states it was not gushing out.  No epistaxis is noted at this time. Admits to ongoing right foot pain around the top of her foot mostly.  Dressing is dry and intact over right ankle and foot.  Objective:  No intake or output data in the 24 hours ending 05/03/24 1315   PHYSICAL EXAMINATION: ECOG PERFORMANCE STATUS: 2 - Symptomatic, <50% confined to bed  Vitals:   05/03/24 0822 05/03/24 1041  BP: (!) 131/59 (!) 134/55  Pulse: (!) 57 61  Resp:    Temp: 99 F (37.2 C)   SpO2: 97% 100%   Filed Weights   05/01/24 0500 05/02/24 0436 05/03/24 0500  Weight: 256 lb 6.3 oz (116.3 kg) 259 lb 7.7 oz (117.7 kg) 268 lb 12.8 oz (121.9 kg)    GENERAL: alert, no distress and comfortable SKIN: skin color, texture, turgor are normal, no rashes or significant lesions EYES: normal, conjunctiva are pink and non-injected, sclera clear OROPHARYNX: no exudate, no erythema and lips, buccal mucosa, and tongue normal  NECK: supple, thyroid normal size, non-tender, without nodularity LYMPH: no palpable lymphadenopathy in the cervical, axillary or inguinal LUNGS: clear to auscultation and percussion with normal breathing effort HEART: regular rate & rhythm and no murmurs and no lower extremity edema ABDOMEN: abdomen soft, non-tender and normal bowel sounds MUSCULOSKELETAL: Right foot with dressing dry and intact PSYCH: alert & oriented x 3 with fluent speech NEURO: no focal motor/sensory deficits   All questions were answered. The patient knows to call the clinic  with any problems, questions or concerns.   I personally spent a total of 40 minutes minutes in the care of the patient today including preparing to see the patient, performing a medically appropriate exam/evaluation, counseling and educating, referring and communicating with other health care professionals, documenting  clinical information in the EHR, communicating results, and coordinating care.    Peggy PARAS Rouson, NP 05/03/2024 1:15 PM    Labs Reviewed:  Lab Results  Component Value Date   WBC 11.0 (H) 05/03/2024   HGB 7.0 (L) 05/03/2024   HCT 24.3 (L) 05/03/2024   MCV 91.7 05/03/2024   PLT 329 05/03/2024   Recent Labs    08/20/23 0000 12/24/23 0800 02/18/24 1300 03/04/24 1440 03/05/24 0452 04/21/24 1532 04/22/24 1326 04/23/24 0655 04/27/24 0444 04/30/24 1015 05/03/24 0103  NA  --    < > 142   < > 139 141 143   < > 143 139 139  K  --    < > 3.7   < > 3.9 3.9 3.8   < > 4.2 3.7 3.8  CL  --    < > 110   < > 109 110 110   < > 109 106 105  CO2  --    < > 25   < > 21* 23 24   < > 27 24 25   GLUCOSE  --    < > 92   < > 86 89 147*   < > 120* 122* 116*  BUN  --    < > 7*   < > 21 9 9    < > 8 10 11   CREATININE  --    < > 0.87   < > 0.73 0.86 0.81   < > 0.69 0.75 0.83  CALCIUM   --    < > 9.1   < > 8.2* 8.4* 8.7*   < > 8.4* 8.5* 8.3*  GFRNONAA  --    < > >60   < > >60  --  >60   < > >60 >60 >60  GFRAA 98  --   --   --   --   --   --   --   --   --   --   PROT  --    < > 7.0  --  5.5* 6.1 6.2*  --   --   --   --   ALBUMIN  --    < > 3.9  --  2.7*  --  3.8  --   --   --   --   AST  --    < > 10*  --  11* 9* 11*  --   --   --   --   ALT  --    < > <5  --  7 6 5   --   --   --   --   ALKPHOS  --    < > 93  --  61  --  92  --   --   --   --   BILITOT  --    < > 0.3  --  0.7 0.2 <0.2  --   --   --   --    < > = values in this interval not displayed.    Studies Reviewed:   ECHOCARDIOGRAM COMPLETE Result Date: 04/25/2024    ECHOCARDIOGRAM REPORT   Patient Name:   Peggy House Date of Exam:  04/25/2024 Medical Rec #:  [id number]              Height:       64.0 in Accession #:    [id number]             Weight:       238.5 lb Date of Birth:  31-Jul-1960              BSA:          2.109 m Patient Age:    63 years               BP:           136/63 mmHg Patient Gender: F                      HR:            119 bpm. Exam Location:  Inpatient Procedure: 2D Echo, Cardiac Doppler, Color Doppler and Strain Analysis (Both            Spectral and Color Flow Doppler were utilized during procedure). Indications:    R94.31 Abnormal EKG  History:        Patient has prior history of Echocardiogram examinations, most                 recent 04/14/2023. Signs/Symptoms:Syncope,                 Dizziness/Lightheadedness and Fatigue; Risk Factors:Diabetes,                 Hypertension, Dyslipidemia and Current Smoker. Port.  Sonographer:    Ellouise Mose RDCS Referring Phys: [id number] [facility]  Sonographer Comments: Patient is obese. Image acquisition challenging due to patient body habitus. IMPRESSIONS  1. Left ventricular ejection fraction, by estimation, is 65 to 70%. Left ventricular ejection fraction by 2D MOD biplane is 69.5 %. The left ventricle has normal function. The left ventricle has no regional wall motion abnormalities. There is severe left ventricular hypertrophy. Left ventricular diastolic parameters are consistent with Grade II diastolic dysfunction (pseudonormalization). Elevated left atrial pressure. The average left ventricular global longitudinal strain is -24.3 %. The global longitudinal strain is normal.  2. Right ventricular systolic function is normal. The right ventricular size is moderately enlarged. There is severely elevated pulmonary artery systolic pressure. The estimated right ventricular systolic pressure is 60.2 mmHg.  3. Left atrial size was moderately dilated.  4. Right atrial size was moderately dilated.  5. The mitral valve is normal in structure. Trivial mitral valve regurgitation. No evidence of mitral stenosis.  6. Tricuspid valve regurgitation is mild to moderate.  7. There is a calcified nodule on the left coronary cusp (#32) and mild to moderate AS with PV 3.1 m/sec, MG 19 mmhg and DVI 0.46. There is evidence of high flow state with LVOT VTI 29 cm. The aortic valve is tricuspid. Aortic  valve regurgitation is trivial. Mild to moderate aortic valve stenosis.  8. There is Moderate (Grade III) atheroma plaque involving the aortic arch.  9. There is late systolic flow reversal of hepatic vein suggestive noncompliant RV. The inferior vena cava is dilated in size with <50% respiratory variability, suggesting right atrial pressure of 15 mmHg. Comparison(s): Prior images reviewed side by side. EF is similar to prior. There is now evidence of pulmonary HTN with RVSP 60, dilated RV/RA, and new aortic stenosis. FINDINGS  Left Ventricle: Left ventricular ejection fraction, by estimation, is  65 to 70%. Left ventricular ejection fraction by 2D MOD biplane is 69.5 %. The left ventricle has normal function. The left ventricle has no regional wall motion abnormalities. The average left ventricular global longitudinal strain is -24.3 %. Strain was performed and the global longitudinal strain is normal. The left ventricular internal cavity size was normal in size. There is severe left ventricular hypertrophy. Left ventricular diastolic parameters are consistent with Grade II diastolic dysfunction (pseudonormalization). Elevated left atrial pressure. Right Ventricle: The right ventricular size is moderately enlarged. No increase in right ventricular wall thickness. Right ventricular systolic function is normal. There is severely elevated pulmonary artery systolic pressure. The tricuspid regurgitant velocity is 3.36 m/s, and with an assumed right atrial pressure of 15 mmHg, the estimated right ventricular systolic pressure is 60.2 mmHg. Left Atrium: Left atrial size was moderately dilated. Right Atrium: Right atrial size was moderately dilated. Pericardium: There is no evidence of pericardial effusion. Mitral Valve: The mitral valve is normal in structure. Trivial mitral valve regurgitation. No evidence of mitral valve stenosis. Tricuspid Valve: The tricuspid valve is normal in structure. Tricuspid valve regurgitation  is mild to moderate. No evidence of tricuspid stenosis. Aortic Valve: There is a calcified nodule on the left coronary cusp (#32) and mild to moderate AS with PV 3.1 m/sec, MG 19 mmhg and DVI 0.46. There is evidence of high flow state with LVOT VTI 29 cm. The aortic valve is tricuspid. Aortic valve regurgitation is trivial. Mild to moderate aortic stenosis is present. Aortic valve mean gradient measures 19.0 mmHg. Aortic valve peak gradient measures 39.4 mmHg. Aortic valve area, by VTI measures 1.45 cm. Pulmonic Valve: The pulmonic valve was normal in structure. Pulmonic valve regurgitation is not visualized. No evidence of pulmonic stenosis. Aorta: The aortic root and ascending aorta are structurally normal, with no evidence of dilitation. There is moderate (Grade III) atheroma plaque involving the aortic arch. Venous: A normal flow pattern is recorded from the right upper pulmonary vein. There is late systolic flow reversal of hepatic vein suggestive noncompliant RV. The inferior vena cava is dilated in size with less than 50% respiratory variability, suggesting right atrial pressure of 15 mmHg. IAS/Shunts: No atrial level shunt detected by color flow Doppler.  LEFT VENTRICLE PLAX 2D                        Biplane EF (MOD) LVIDd:         5.40 cm         LV Biplane EF:   Left LVIDs:         3.00 cm                          ventricular LV PW:         1.30 cm                          ejection LV IVS:        1.10 cm                          fraction by LVOT diam:     2.00 cm                          2D MOD LV SV:         91  biplane is LV SV Index:   43                               69.5 %. LVOT Area:     3.14 cm LV IVRT:       85 msec         Diastology                                LV e' medial:    10.80 cm/s                                LV E/e' medial:  14.6 LV Volumes (MOD)               LV e' lateral:   11.20 cm/s LV vol d, MOD    150.0 ml      LV E/e' lateral: 14.1 A2C: LV vol  d, MOD    147.0 ml      2D Longitudinal A4C:                           Strain LV vol s, MOD    50.4 ml       2D Strain GLS   -26.4 % A2C:                           (A4C): LV vol s, MOD    39.2 ml       2D Strain GLS   -23.8 % A4C:                           (A3C): LV SV MOD A2C:   99.6 ml       2D Strain GLS   -22.7 % LV SV MOD A4C:   147.0 ml      (A2C): LV SV MOD BP:    103.3 ml      2D Strain GLS   -24.3 %                                Avg: RIGHT VENTRICLE             IVC RV S prime:     14.40 cm/s  IVC diam: 2.90 cm TAPSE (M-mode): 2.9 cm                             PULMONARY VEINS                             Diastolic Velocity: 42.50 cm/s                             S/D Velocity:       1.40                             Systolic Velocity:  60.00 cm/s LEFT ATRIUM             Index  RIGHT ATRIUM           Index LA diam:        4.00 cm 1.90 cm/m   RA Area:     26.10 cm LA Vol (A2C):   95.1 ml 45.10 ml/m  RA Volume:   85.40 ml  40.50 ml/m LA Vol (A4C):   75.0 ml 35.57 ml/m LA Biplane Vol: 88.9 ml 42.16 ml/m  AORTIC VALVE AV Area (Vmax):    1.31 cm AV Area (Vmean):   1.49 cm AV Area (VTI):     1.45 cm AV Vmax:           314.00 cm/s AV Vmean:          187.750 cm/s AV VTI:            0.631 m AV Peak Grad:      39.4 mmHg AV Mean Grad:      19.0 mmHg LVOT Vmax:         131.00 cm/s LVOT Vmean:        89.300 cm/s LVOT VTI:          0.291 m LVOT/AV VTI ratio: 0.46  AORTA Ao Root diam: 3.30 cm Ao Asc diam:  3.70 cm MITRAL VALVE                TRICUSPID VALVE MV Area (PHT): 2.76 cm     TR Peak grad:   45.2 mmHg MV Decel Time: 275 msec     TR Vmax:        336.00 cm/s MV E velocity: 158.00 cm/s MV A velocity: 123.00 cm/s  SHUNTS MV E/A ratio:  1.28         Systemic VTI:  0.29 m                             Systemic Diam: 2.00 cm Joelle Azobou Tonleu Electronically signed by Joelle Cedars Tonleu Signature Date/Time: 04/25/2024/3:02:27 PM    Final    DG CHEST PORT 1 VIEW Result Date: 04/23/2024 CLINICAL DATA:   Endotracheal intubation EXAM: PORTABLE CHEST 1 VIEW COMPARISON:  April 22, 2024 FINDINGS: Stable cardiomegaly. Endotracheal tube is noted distal tip 1.5 cm above the carina. Right internal jugular Port-A-Cath is unchanged. Right lung is clear. Mild left retrocardiac opacity is noted concerning for possible atelectasis or infiltrate and associated effusion. IMPRESSION: Endotracheal tube is noted with distal tip 1.5 cm above the carina. Mild left retrocardiac opacity is noted concerning for possible atelectasis or infiltrate and associated effusion. Electronically Signed   By: Lynwood Landy Raddle M.D.   On: 04/23/2024 15:32   US  EKG SITE RITE Result Date: 04/23/2024 If Site Rite image not attached, placement could not be confirmed due to current cardiac rhythm.  DG Chest 2 View Result Date: 04/22/2024 CLINICAL DATA:  Shortness of breath EXAM: CHEST - 2 VIEW COMPARISON:  March 04, 2024 FINDINGS: Stable cardiomegaly. Right internal jugular port is unchanged. Both lungs are clear. The visualized skeletal structures are unremarkable. IMPRESSION: No active cardiopulmonary disease. Electronically Signed   By: Lynwood Landy Raddle M.D.   On: 04/22/2024 14:08     Addendum I have seen the patient, examined her. I agree with the assessment and and plan and have edited the notes.   Patient had intermittent epistaxis yesterday and this morning, which stopped on its own.  Hemoglobin slightly dropped, overall stable.  She is awaiting for SNF placement.  She is  due for next bevacizumab  infusion on January 28, which I have scheduled in my office.  Will repeat her iron  level tomorrow morning, to see if she needs more IV iron .  All questions were answered.  I will follow-up as needed.  [name] MD 05/03/2024

## 2024-05-04 DIAGNOSIS — D649 Anemia, unspecified: Secondary | ICD-10-CM | POA: Diagnosis not present

## 2024-05-04 LAB — BASIC METABOLIC PANEL WITH GFR
Anion gap: 13 (ref 5–15)
BUN: 10 mg/dL (ref 8–23)
CO2: 21 mmol/L — ABNORMAL LOW (ref 22–32)
Calcium: 8.6 mg/dL — ABNORMAL LOW (ref 8.9–10.3)
Chloride: 106 mmol/L (ref 98–111)
Creatinine, Ser: 0.77 mg/dL (ref 0.44–1.00)
GFR, Estimated: >60 mL/min
Glucose, Bld: 142 mg/dL — ABNORMAL HIGH (ref 70–99)
Potassium: 3.9 mmol/L (ref 3.5–5.1)
Sodium: 139 mmol/L (ref 135–145)

## 2024-05-04 LAB — IRON AND TIBC
Iron: 17 ug/dL — ABNORMAL LOW (ref 28–170)
Saturation Ratios: 6 % — ABNORMAL LOW (ref 10.4–31.8)
TIBC: 288 ug/dL (ref 250–450)
UIBC: 271 ug/dL

## 2024-05-04 LAB — FERRITIN: Ferritin: 67 ng/mL (ref 11–307)

## 2024-05-04 NOTE — Plan of Care (Signed)
" °  Problem: Education: Goal: Knowledge of General Education information will improve Description: Including pain rating scale, medication(s)/side effects and non-pharmacologic comfort measures Outcome: Progressing   Problem: Health Behavior/Discharge Planning: Goal: Ability to manage health-related needs will improve Outcome: Progressing   Problem: Clinical Measurements: Goal: Ability to maintain clinical measurements within normal limits will improve Outcome: Progressing Goal: Will remain free from infection Outcome: Progressing Goal: Diagnostic test results will improve Outcome: Progressing   Problem: Activity: Goal: Risk for activity intolerance will decrease Outcome: Progressing   Problem: Coping: Goal: Level of anxiety will decrease Outcome: Progressing   Problem: Pain Managment: Goal: General experience of comfort will improve and/or be controlled Outcome: Progressing   Problem: Safety: Goal: Ability to remain free from injury will improve Outcome: Progressing   Problem: Skin Integrity: Goal: Risk for impaired skin integrity will decrease Outcome: Progressing   Problem: Activity: Goal: Ability to tolerate increased activity will improve Outcome: Progressing   Problem: Role Relationship: Goal: Method of communication will improve Outcome: Progressing   "

## 2024-05-04 NOTE — Progress Notes (Addendum)
 " PROGRESS NOTE    Peggy House  FMW:[id number] DOB: April 25, 1960 DOA: 04/22/2024 PCP: Nooruddin, Saad, MD   Brief Narrative:   64 year old female history of chronic anemia including iron  deficiency, AVMs, depression/anxiety, chronic right ankle wound, chronic thrombocytopenia, hereditary hemorrhagic telangiectasia (HHT), HTN, HLD, GERD, DM II, osteomyelitis of Rt fibula and tibia (has been on multiple course of antibiotics lately 12/19 was given linezolid  x 2 weeks) who presented to ED with chief complaint of progressive generalized weakness, fatigue and dizziness.  Reported she has had nosebleed and had a episode of black stool approximately 3 days ago.  She is not on any blood thinners.  She had a history of GI bleed and nosebleeds in the past.  On arrival her hemoglobin was 4.1.  She underwent EGD which was complicated by severe epistaxis at the onset of the procedure, requiring emergent intubation, nasal packing and transferred to the ICU.  EGD showed numerous gastric AVMs, 5 were treated with APC some with Hemoclip, video capsule endoscopy was also completed   She has been in and out of the ICU [facility] assumed care on 1/16 Needs 6 weeks of IV antibiotics  Pending SNF placement  Significant events:  04/22/2024: Admitted for acute blood loss anemia and GI bleed 04/23/2024: Epistaxis on endoscopy, remained intubated for airway protection 04/26/2014: Repeat epistaxis, back to ICU, given TXA 04/28/2024: Got Avastin  and IV iron   04/30/2024: Care transferred to [facility] team.  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  - She is having intermittent epistaxis episodes but self resolving - Empiric antibiotics for TSS prophylaxis, supposed to be stopped on 05/01/2024 - Follow CBC closely - Follow-up outpatient with ENT specialist in [facility]  Hereditary hemorrhagic telangiectasias, POA: Off of VEGF inhibitor due to healing issues after ankle surgery.  Outpatient follow-up with ENT specialist emergency room Hematology recommended another dose of Avista on 1/28.   Essential hypertension, POA: Continue with amlodipine  and lisinopril .   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  Plan is vancomycin  for 6 weeks via Mediport end date of 06/04/24.   Outpatient follow-up with Dr. Dennise, ID on 05/06/2024. Continue with IV Vancomycin  2 g daily.  GERD, POA: Continue with Protonix   Anxiety/Depression: Continue with celexa , gabepentin and xanax .  Physical deconditioning: PT recommended SNF  Disposition: PT recommended SNF  DVT prophylaxis: SCDs Start: 04/22/24 1441     Code Status: Full Code Family Communication:  None at the bedside Status is: Inpatient Remains inpatient appropriate because: Bleeding    Subjective: No acute events overnight. No evidence of active epistaxis. Pending SNF placement.   Examination:  General exam: Appears calm and comfortable  Respiratory system: Clear to auscultation.  Respiratory effort normal. Cardiovascular system: S1 & S2 heard, RRR. No JVD, murmurs, rubs, gallops or clicks. No pedal edema. Gastrointestinal  system: Abdomen is nondistended, soft and nontender. No organomegaly or masses felt. Normal bowel sounds heard. Central nervous system: Alert and oriented. No focal neurological deficits. Extremities: Symmetric 5 x 5 power. Skin:right ankle wound dressing in place Psychiatry: Judgement and insight appear normal. Mood & affect appropriate.       Diet Orders (From admission, onward)     Start     Ordered   04/27/24 1504  Diet regular Room service appropriate? Yes; Fluid consistency: Thin  Diet effective now       Question Answer Comment  Room service appropriate? Yes   Fluid consistency: Thin      04/27/24 1503            Objective: Vitals:   05/03/24 1847 05/04/24 0048 05/04/24 0428 05/04/24 0749  BP: 132/62 (!) 153/63 (!) 109/55 (!) 141/59  Pulse: 65 71 63 73  Resp: 17 18 16 20   Temp: 98.4 F (36.9 C) 97.8 F (36.6 C) 98.1 F (36.7 C) 98.4 F (36.9 C)  TempSrc: Oral Oral Oral Oral  SpO2: 100% 100% 98% 100%  Weight:   122.4 kg   Height:        Intake/Output Summary (Last 24 hours) at 05/04/2024 0946 Last data filed at 05/04/2024 0446 Gross per 24 hour  Intake 1213.11 ml  Output 400 ml  Net 813.11 ml    Filed Weights   05/02/24 0436 05/03/24 0500 05/04/24 0428  Weight: 117.7 kg 121.9 kg 122.4 kg    Scheduled Meds:  aminocaproic  acid  1 g Oral Q8H   amLODipine   10 mg Oral Daily   Chlorhexidine  Gluconate Cloth  6 each Topical Daily   citalopram   20 mg Oral Daily   gabapentin   100 mg Oral QHS   lisinopril   20 mg Oral Daily   oxymetazoline   1 spray Each Nare BID   pantoprazole   40 mg Oral BID   polyethylene glycol  17 g Oral Daily   senna  1 tablet Oral BID   sodium chloride  flush  10-40 mL Intracatheter Q12H   sodium chloride  flush  3 mL Intravenous Q12H   sodium chloride  flush  3 mL Intravenous Q12H   sucralfate   1 g Oral TID WC & HS   Continuous Infusions:  vancomycin  Stopped (05/04/24 0025)    Nutritional status     Body mass index is 46.32  kg/m.  Data Reviewed:   CBC: Recent Labs  Lab 04/28/24 2227 04/29/24 0516 04/30/24 1015 05/01/24 0401 05/02/24 1655 05/03/24 0103  WBC 11.9* 11.2* 11.1* 12.2*  --  11.0*  NEUTROABS  --   --   --  10.0*  --  8.8*  HGB 7.6* 7.3* 7.5* 7.6* 7.1* 7.0*  HCT 25.7* 24.3* 24.4* 25.1* 23.9* 24.3*  MCV 88.0 89.0 88.1 90.0  --  91.7  PLT 347 302 323 336  --  329   Basic Metabolic Panel: Recent Labs  Lab 04/30/24 1015 05/03/24 0103 05/04/24 0136  NA 139 139 139  K 3.7 3.8 3.9  CL 106 105 106  CO2 24 25 21*  GLUCOSE 122* 116* 142*  BUN 10 11 10   CREATININE 0.75 0.83 0.77  CALCIUM  8.5* 8.3* 8.6*   GFR: Estimated Creatinine Clearance: 92.9 mL/min (by C-G formula based on SCr of 0.77 mg/dL). Liver Function Tests: No results for input(s): AST, ALT, ALKPHOS, BILITOT, PROT, ALBUMIN  in the last 168 hours. No results for input(s): LIPASE, AMYLASE in the last 168 hours. No results for input(s): AMMONIA in the last 168 hours. Coagulation Profile: No results for input(s): INR, PROTIME in the last 168 hours.  Cardiac Enzymes: No results for input(s): CKTOTAL, CKMB, CKMBINDEX, TROPONINI in the last 168 hours. BNP (last 3 results) No results for input(s): PROBNP in the last 8760 hours. HbA1C: No results for input(s): HGBA1C in the last 72 hours. CBG: Recent Labs  Lab 05/01/24 0805 05/01/24 2020 05/02/24 0825 05/02/24 1948 05/03/24 0821  GLUCAP 91 115* 110* 118* 98   Lipid Profile: No results for input(s): CHOL, HDL, LDLCALC, TRIG, CHOLHDL, LDLDIRECT in the last 72 hours. Thyroid Function Tests: No results for input(s): TSH, T4TOTAL, FREET4, T3FREE, THYROIDAB in the last 72 hours. Anemia Panel: Recent Labs    05/04/24 0136  FERRITIN 67  TIBC 288  IRON  17*   Sepsis Labs: No results for input(s): PROCALCITON, LATICACIDVEN in the last 168 hours.  No results found for this or any previous visit (from the past 240  hours).       Radiology Studies: No results found.         LOS: 12 days   Time spent= 35 mins    Deliliah Room, MD [facility]  If 7PM-7AM, please contact night-coverage  05/04/2024, 9:46 AM  "

## 2024-05-04 NOTE — Plan of Care (Signed)
  Problem: Education: Goal: Knowledge of General Education information will improve Description: Including pain rating scale, medication(s)/side effects and non-pharmacologic comfort measures Outcome: Progressing   Problem: Clinical Measurements: Goal: Ability to maintain clinical measurements within normal limits will improve Outcome: Progressing   Problem: Activity: Goal: Risk for activity intolerance will decrease Outcome: Progressing   Problem: Nutrition: Goal: Adequate nutrition will be maintained Outcome: Progressing   Problem: Coping: Goal: Level of anxiety will decrease Outcome: Progressing   Problem: Elimination: Goal: Will not experience complications related to bowel motility Outcome: Progressing   Problem: Safety: Goal: Ability to remain free from injury will improve Outcome: Progressing   Problem: Skin Integrity: Goal: Risk for impaired skin integrity will decrease Outcome: Progressing   

## 2024-05-05 DIAGNOSIS — D649 Anemia, unspecified: Secondary | ICD-10-CM | POA: Diagnosis not present

## 2024-05-05 LAB — BASIC METABOLIC PANEL WITH GFR
Anion gap: 9 (ref 5–15)
BUN: 10 mg/dL (ref 8–23)
CO2: 24 mmol/L (ref 22–32)
Calcium: 8.4 mg/dL — ABNORMAL LOW (ref 8.9–10.3)
Chloride: 105 mmol/L (ref 98–111)
Creatinine, Ser: 0.77 mg/dL (ref 0.44–1.00)
GFR, Estimated: >60 mL/min
Glucose, Bld: 125 mg/dL — ABNORMAL HIGH (ref 70–99)
Potassium: 4.2 mmol/L (ref 3.5–5.1)
Sodium: 138 mmol/L (ref 135–145)

## 2024-05-05 LAB — CBC WITH DIFFERENTIAL/PLATELET
Abs Immature Granulocytes: 0.04 K/uL (ref 0.00–0.07)
Basophils Absolute: 0 K/uL (ref 0.0–0.1)
Basophils Relative: 0 %
Eosinophils Absolute: 0.2 K/uL (ref 0.0–0.5)
Eosinophils Relative: 2 %
HCT: 25.3 % — ABNORMAL LOW (ref 36.0–46.0)
Hemoglobin: 7.4 g/dL — ABNORMAL LOW (ref 12.0–15.0)
Immature Granulocytes: 0 %
Lymphocytes Relative: 9 %
Lymphs Abs: 0.9 K/uL (ref 0.7–4.0)
MCH: 26.1 pg (ref 26.0–34.0)
MCHC: 29.2 g/dL — ABNORMAL LOW (ref 30.0–36.0)
MCV: 89.4 fL (ref 80.0–100.0)
Monocytes Absolute: 0.5 K/uL (ref 0.1–1.0)
Monocytes Relative: 5 %
Neutro Abs: 8.1 K/uL — ABNORMAL HIGH (ref 1.7–7.7)
Neutrophils Relative %: 84 %
Platelets: 389 K/uL (ref 150–400)
RBC: 2.83 MIL/uL — ABNORMAL LOW (ref 3.87–5.11)
RDW: 18.4 % — ABNORMAL HIGH (ref 11.5–15.5)
WBC: 9.7 K/uL (ref 4.0–10.5)
nRBC: 0 % (ref 0.0–0.2)

## 2024-05-05 LAB — GLUCOSE, CAPILLARY: Glucose-Capillary: 119 mg/dL — ABNORMAL HIGH (ref 70–99)

## 2024-05-05 MED ORDER — ALPRAZOLAM 0.5 MG PO TABS: 0.5000 mg | ORAL_TABLET | Freq: Every evening | ORAL | 0 refills | Status: DC | PRN

## 2024-05-05 MED ORDER — VANCOMYCIN IV (FOR PTA / DISCHARGE USE ONLY): 2000.0000 mg | INTRAVENOUS | 0 refills | Status: DC

## 2024-05-05 MED ORDER — HEPARIN SOD (PORK) LOCK FLUSH 100 UNIT/ML IV SOLN: 500.0000 [IU] | INTRAVENOUS | Status: DC | PRN

## 2024-05-05 MED ORDER — AMINOCAPROIC ACID 500 MG PO TABS: 1000.0000 mg | ORAL_TABLET | Freq: Three times a day (TID) | ORAL | Status: AC

## 2024-05-05 MED ORDER — OXYCODONE HCL 5 MG PO TABS: 5.0000 mg | ORAL_TABLET | ORAL | 0 refills | Status: DC | PRN

## 2024-05-05 MED ORDER — SENNA 8.6 MG PO TABS: 1.0000 | ORAL_TABLET | Freq: Two times a day (BID) | ORAL | Status: AC

## 2024-05-05 MED ORDER — SUCRALFATE 1 GM/10ML PO SUSP: 1.0000 g | Freq: Three times a day (TID) | ORAL | Status: AC

## 2024-05-05 MED ORDER — POLYETHYLENE GLYCOL 3350 17 G PO PACK: 17.0000 g | PACK | Freq: Every day | ORAL | Status: AC

## 2024-05-05 NOTE — TOC Progression Note (Addendum)
 Transition of Care [facility]) - Progression Note    Patient Details  Name: Peggy House MRN: [id number] Date of Birth: 05/19/1960  [facility] CM/SW Contact  Sherline Clack, [place] Phone Number: 05/05/2024, 8:36 AM  Clinical Narrative:     Update 4:02 PM: [facility] has offered a bed for patient. CSW presented bed offer and patient would like to accept bed offer. Facility will reach out to CSW when they are able to accept patient into the facility. Anticipated discharge 1/22 or 1/23. Provider made aware. CSW will continue to follow.   Patient currently has no bed offers. CSW will reach out to facilities again to check on bed availability. CSW will continue to follow and update DC plan.  Expected Discharge Plan: Home w Home Health Services Barriers to Discharge: Continued Medical Work up               Expected Discharge Plan and Services   Discharge Planning Services: CM Consult   Living arrangements for the past 2 months: Single Family Home                                       Social Drivers of Health (SDOH) Interventions SDOH Screenings   Food Insecurity: No Food Insecurity (04/22/2024)  Housing: Low Risk (04/22/2024)  Transportation Needs: No Transportation Needs (04/22/2024)  Utilities: Not At Risk (04/22/2024)  Depression (PHQ2-9): Low Risk (03/20/2024)  Tobacco Use: High Risk (04/23/2024)    Readmission Risk Interventions    04/18/2023    3:07 PM 04/15/2023    2:22 PM  Readmission Risk Prevention Plan  Transportation Screening Complete Complete  PCP or Specialist Appt within 5-7 Days  Complete  PCP or Specialist Appt within 3-5 Days Complete   Home Care Screening  Complete  Medication Review (RN CM)  Complete  HRI or Home Care Consult Complete   Social Work Consult for Recovery Care Planning/Counseling Complete   Palliative Care Screening Complete   Medication Review Oceanographer) Complete

## 2024-05-05 NOTE — Plan of Care (Signed)
  Problem: Education: Goal: Knowledge of General Education information will improve Description: Including pain rating scale, medication(s)/side effects and non-pharmacologic comfort measures Outcome: Progressing   Problem: Health Behavior/Discharge Planning: Goal: Ability to manage health-related needs will improve Outcome: Progressing   Problem: Clinical Measurements: Goal: Ability to maintain clinical measurements within normal limits will improve Outcome: Progressing Goal: Will remain free from infection Outcome: Progressing Goal: Diagnostic test results will improve Outcome: Progressing Goal: Respiratory complications will improve Outcome: Progressing Goal: Cardiovascular complication will be avoided Outcome: Progressing   Problem: Activity: Goal: Risk for activity intolerance will decrease Outcome: Progressing   Problem: Nutrition: Goal: Adequate nutrition will be maintained Outcome: Progressing   Problem: Coping: Goal: Level of anxiety will decrease Outcome: Progressing   Problem: Elimination: Goal: Will not experience complications related to bowel motility Outcome: Progressing Goal: Will not experience complications related to urinary retention Outcome: Progressing   Problem: Pain Managment: Goal: General experience of comfort will improve and/or be controlled Outcome: Progressing   Problem: Safety: Goal: Ability to remain free from injury will improve Outcome: Progressing   Problem: Skin Integrity: Goal: Risk for impaired skin integrity will decrease Outcome: Progressing   Problem: Activity: Goal: Ability to tolerate increased activity will improve Outcome: Progressing   Problem: Respiratory: Goal: Ability to maintain a clear airway and adequate ventilation will improve Outcome: Progressing   Problem: Role Relationship: Goal: Method of communication will improve Outcome: Progressing

## 2024-05-05 NOTE — Discharge Summary (Signed)
 " Physician Discharge Summary   Patient: Peggy House MRN: [id number] DOB: 11/16/1960  Admit date:     04/22/2024  Discharge date: 05/05/24  Discharge Physician: Lonni SHAUNNA Dalton   PCP: Nooruddin, Saad, MD     Recommendations at discharge:  Follow up with Infectious Disease Dr. Dennise on 1/22 for osteomyelitis Follow up with ENT Dr. Llewellyn for epistaxis (call office to confirm if patient should follow up with [facility] in 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 F with hereditary telangiectasias, hx AVMs, iron  deficiency anemia, DM, obesity and chronic osteomyelitis of the right fibula who presented with weakness, nosebleed and melena.  Admitted and found to have Hgb 4.1.     Melena and GI bleeding due to AVMs Admitted and GI consulted.  Underwent EGD that showed numerous gastric AVMs treated with APC and hemoclip.  VCE showed few small nonbleeding AVMs.  GI recommended 8 weeks PPI BID for 8 weeks then once daily.  Carafate  2 weeks.  GI follow up   Severe epistaxis, acute blood loss anemia, POA: ENT consulted.  Received Avastin   04/28/2024 along with IV iron   Clots were evacuated and Surgiflo and Nasopore used on 1/12 by ENT.    Epistaxis slowed in the last week.   - Follow-up outpatient with ENT specialist in [facility]     Hereditary hemorrhagic telangiectasias, POA: Off of VEGF inhibitor due to healing issues after ankle surgery.  Outpatient follow-up with ENT specialist emergency room   Hematology recommended another dose of Avastin  on 1/28. - Follow up with Hematology Dr. Lanny within 2 weeks     Essential hypertension, POA: Continue with amlodipine  and lisinopril .    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  Plan is vancomycin  for 6 weeks via Mediport end date of 06/04/24.   Outpatient follow-up with Dr. Dennise, ID on 05/06/2024. Continue with IV Vancomycin  2 g daily.               The [place]  Controlled Substances Registry was reviewed for this patient prior to discharge.  Disposition: Skilled nursing facility Diet recommendation:  Regular diet  DISCHARGE MEDICATION: Allergies as of 05/05/2024       Reactions   Feraheme  [ferumoxytol ] Other (See Comments)   Muscle tetany, encephalopathy, fatigue, nausea (reaction to injection)   Nsaids Other (See Comments)   Stomach bleeding episodes; Tylenol  is OK   Wasp Venom Anaphylaxis   Tomato Hives   Iron  (ferrous Sulfate ) [ferrous Sulfate  Er] Other (See Comments)   Shock        Medication List  STOP taking these medications    linezolid  600 MG tablet Commonly known as: ZYVOX        TAKE these medications    acetaminophen  325 MG tablet Commonly known as: Tylenol  Take 2 tablets (650 mg total) by mouth every 6 (six) hours as needed. What changed: reasons to take this   ALPRAZolam  0.5 MG tablet Commonly known as: XANAX  Take 1 tablet (0.5 mg total) by mouth at bedtime as needed for anxiety. What changed:  when to take this reasons to take this   aminocaproic   acid 500 MG tablet Commonly known as: AMICAR  Take 2 tablets (1,000 mg total) by mouth every 8 (eight) hours.   amLODipine  10 MG tablet Commonly known as: NORVASC  Take 1 tablet (10 mg total) by mouth daily.   citalopram  20 MG tablet Commonly known as: CELEXA  Take 1 tablet (20 mg total) by mouth daily.   FT Pain Relief Max Strength 4 % Generic drug: lidocaine  Place 1 patch onto the skin daily. Remove & Discard patch within 12 hours or as directed by MD   gabapentin  100 MG capsule Commonly known as: Neurontin  Take 1 capsule (100 mg total) by mouth at bedtime. What changed:  when to take this reasons to take this   leptospermum manuka honey Pste paste Apply 1 Application topically daily.   lisinopril  20 MG tablet Commonly known as: ZESTRIL  Take 1 tablet (20 mg total) by mouth daily.   oxyCODONE  5 MG immediate release tablet Commonly known as: Roxicodone  Take 1 tablet (5 mg total) by mouth every 4 (four) hours as needed (for post op pain).   pantoprazole  40 MG tablet Commonly known as: PROTONIX  Take 1 tablet (40 mg total) by mouth 2 (two) times daily. TAKE 1 TABLET(40 MG) BY MOUTH TWICE DAILY   polyethylene glycol 17 g packet Commonly known as: MIRALAX  / GLYCOLAX  Take 17 g by mouth daily. Start taking on: May 06, 2024   senna 8.6 MG Tabs tablet Commonly known as: SENOKOT Take 1 tablet (8.6 mg total) by mouth 2 (two) times daily.   sucralfate  1 GM/10ML suspension Commonly known as: CARAFATE  Take 10 mLs (1 g total) by mouth 4 (four) times daily -  with meals and at bedtime.   traZODone  50 MG tablet Commonly known as: DESYREL  Take 1 tablet (50 mg total) by mouth at bedtime. What changed:  when to take this reasons to take this   vancomycin  IVPB Inject 2,000 mg into the vein daily. Indication:  Osteomyelitis First Dose: Yes Last Day of Therapy:  06/04/2024 Labs - weekly:  CBC/D, BMP, and vancomycin  trough. Labs - weekly:  BMP and vancomycin  trough Labs - Once  weekly: ESR and CRP Method of administration:Elastomeric or per protocol Method of administration may be changed at the discretion of the patient and/or caregiver's ability to self-administer the medication ordered.               Discharge Care Instructions  (From admission, onward)           Start     Ordered   05/05/24 0000  Change dressing on IV access line weekly and PRN  (Home infusion instructions - [facility] )        05/05/24 1350   05/05/24 0000  Discharge wound care:       Comments: Cleanse R ankle/lower leg wound with Vashe, do not rinse. Apply Vashe moistened gauze to wound bed daily, cover with dry gauze and silicone foam or Kerlix roll gauze  whichever is preferred.   05/05/24 1350            Follow-up Information     Kandis Perkins, DO Follow up.   Specialty: Internal Medicine Why: TIME : 10:15 AM DATE : JANUARY 26 , 2026 MONDAY  PLEASE BRING ALL CURRENT MEDICATION,ID and INS CARD Contact information: [address] [place] [address] [phone]         Dennise Kingsley, MD Follow up.   Specialty: Infectious Diseases Contact information: [address], [address] [place] [address] [phone]         Stacia Glendia BRAVO, MD Follow up.   Specialty: Gastroenterology Contact information: [address] [place] [address] [phone]         Skotnicki, Gerard A, DO Follow up.   Specialty: Otolaryngology Contact information: [address] [place] [address] [phone]                 Discharge Instructions     [facility] pharmacist to adjust dose for Vancomycin , Aminoglycosides and other anti-infective therapies as requested by physician.   Complete by: As directed    [facility] to provide Cath Flo 2mg    Complete by: As directed    Administer for PICC line occlusion and as ordered by physician for other access device issues.    Anaphylaxis Kit: Provided to treat any anaphylactic reaction to the medication being provided to the patient if First Dose or when requested by physician   Complete by: As directed    Epinephrine  1mg /ml vial / amp: Administer 0.3mg  (0.46ml) subcutaneously once for moderate to severe anaphylaxis, nurse to call physician and pharmacy when reaction occurs and call 911 if needed for immediate care   Diphenhydramine  50mg /ml IV vial: Administer 25-50mg  IV/IM PRN for first dose reaction, rash, itching, mild reaction, nurse to call physician and pharmacy when reaction occurs   Sodium Chloride  0.9% NS 500ml IV: Administer if needed for hypovolemic blood pressure drop or as ordered by physician after call to physician with anaphylactic reaction   Change dressing on IV access line weekly and PRN   Complete by: As directed    Discharge wound care:   Complete by: As directed    Cleanse R ankle/lower leg wound with Vashe, do not rinse. Apply Vashe moistened gauze to wound bed daily, cover with dry gauze and silicone foam or Kerlix roll gauze whichever is preferred.   Flush IV access with Sodium Chloride  0.9% and Heparin  10 units/ml or 100 units/ml   Complete by: As directed    Home infusion instructions - [facility]   Complete by: As directed    Instructions: Flush IV access with Sodium Chloride  0.9% and Heparin  10units/ml or 100units/ml   Change dressing on IV access line: Weekly and PRN   Instructions Cath Flo 2mg : Administer for PICC Line occlusion and as ordered by physician for other access device   [facility] pharmacist to adjust dose for: Vancomycin , Aminoglycosides and other anti-infective therapies as requested by physician   Increase activity slowly   Complete by: As directed    Method of administration may be changed at the discretion of home infusion pharmacist based upon assessment of the patient and/or caregivers ability to self-administer the medication ordered   Complete  by: As directed        Discharge Exam: Filed Weights   05/03/24 0500 05/04/24 0428 05/05/24 0434  Weight: 121.9 kg 122.4  kg 121.2 kg    General: Pt is alert, awake, not in acute distress Cardiovascular: RRR, nl S1-S2, no murmurs appreciated.   No LE edema.   Respiratory: Normal respiratory rate and rhythm.  CTAB without rales or wheezes. Abdominal: Abdomen soft and non-tender.  No distension or HSM.   Neuro/Psych: Strength symmetric in upper and lower extremities.  Judgment and insight appear normal.   Condition at discharge: good  The results of significant diagnostics from this hospitalization (including imaging, microbiology, ancillary and laboratory) are listed below for reference.   Imaging Studies: ECHOCARDIOGRAM COMPLETE Result Date: 04/25/2024    ECHOCARDIOGRAM REPORT   Patient Name:   Peggy House Date of Exam: 04/25/2024 Medical Rec #:  [id number]              Height:       64.0 in Accession #:    [id number]             Weight:       238.5 lb Date of Birth:  [birth year]              BSA:          2.109 m Patient Age:    63 years               BP:           136/63 mmHg Patient Gender: F                      HR:           119 bpm. Exam Location:  Inpatient Procedure: 2D Echo, Cardiac Doppler, Color Doppler and Strain Analysis (Both            Spectral and Color Flow Doppler were utilized during procedure). Indications:    R94.31 Abnormal EKG  History:        Patient has prior history of Echocardiogram examinations, most                 recent 04/14/2023. Signs/Symptoms:Syncope,                 Dizziness/Lightheadedness and Fatigue; Risk Factors:Diabetes,                 Hypertension, Dyslipidemia and Current Smoker. Port.  Sonographer:    Ellouise Mose RDCS Referring Phys: [id number] [facility]  Sonographer Comments: Patient is obese. Image acquisition challenging due to patient body habitus. IMPRESSIONS  1. Left ventricular ejection fraction, by estimation, is 65 to 70%. Left  ventricular ejection fraction by 2D MOD biplane is 69.5 %. The left ventricle has normal function. The left ventricle has no regional wall motion abnormalities. There is severe left ventricular hypertrophy. Left ventricular diastolic parameters are consistent with Grade II diastolic dysfunction (pseudonormalization). Elevated left atrial pressure. The average left ventricular global longitudinal strain is -24.3 %. The global longitudinal strain is normal.  2. Right ventricular systolic function is normal. The right ventricular size is moderately enlarged. There is severely elevated pulmonary artery systolic pressure. The estimated right ventricular systolic pressure is 60.2 mmHg.  3. Left atrial size was moderately dilated.  4. Right atrial size was moderately dilated.  5. The mitral valve is normal in structure. Trivial mitral valve regurgitation. No evidence of mitral stenosis.  6. Tricuspid valve regurgitation is mild to moderate.  7. There is a calcified nodule on the left coronary cusp (#32) and mild to moderate AS with PV 3.1 m/sec, MG  19 mmhg and DVI 0.46. There is evidence of high flow state with LVOT VTI 29 cm. The aortic valve is tricuspid. Aortic valve regurgitation is trivial. Mild to moderate aortic valve stenosis.  8. There is Moderate (Grade III) atheroma plaque involving the aortic arch.  9. There is late systolic flow reversal of hepatic vein suggestive noncompliant RV. The inferior vena cava is dilated in size with <50% respiratory variability, suggesting right atrial pressure of 15 mmHg. Comparison(s): Prior images reviewed side by side. EF is similar to prior. There is now evidence of pulmonary HTN with RVSP 60, dilated RV/RA, and new aortic stenosis. FINDINGS  Left Ventricle: Left ventricular ejection fraction, by estimation, is 65 to 70%. Left ventricular ejection fraction by 2D MOD biplane is 69.5 %. The left ventricle has normal function. The left ventricle has no regional wall motion  abnormalities. The average left ventricular global longitudinal strain is -24.3 %. Strain was performed and the global longitudinal strain is normal. The left ventricular internal cavity size was normal in size. There is severe left ventricular hypertrophy. Left ventricular diastolic parameters are consistent with Grade II diastolic dysfunction (pseudonormalization). Elevated left atrial pressure. Right Ventricle: The right ventricular size is moderately enlarged. No increase in right ventricular wall thickness. Right ventricular systolic function is normal. There is severely elevated pulmonary artery systolic pressure. The tricuspid regurgitant velocity is 3.36 m/s, and with an assumed right atrial pressure of 15 mmHg, the estimated right ventricular systolic pressure is 60.2 mmHg. Left Atrium: Left atrial size was moderately dilated. Right [facility]rium: Right atrial size was moderately dilated. Pericardium: There is no evidence of pericardial effusion. Mitral Valve: The mitral valve is normal in structure. Trivial mitral valve regurgitation. No evidence of mitral valve stenosis. Tricuspid Valve: The tricuspid valve is normal in structure. Tricuspid valve regurgitation is mild to moderate. No evidence of tricuspid stenosis. Aortic Valve: There is a calcified nodule on the left coronary cusp (#32) and mild to moderate AS with PV 3.1 m/sec, MG 19 mmhg and DVI 0.46. There is evidence of high flow state with LVOT VTI 29 cm. The aortic valve is tricuspid. Aortic valve regurgitation is trivial. Mild to moderate aortic stenosis is present. Aortic valve mean gradient measures 19.0 mmHg. Aortic valve peak gradient measures 39.4 mmHg. Aortic valve area, by VTI measures 1.45 cm. Pulmonic Valve: The pulmonic valve was normal in structure. Pulmonic valve regurgitation is not visualized. No evidence of pulmonic stenosis. Aorta: The aortic root and ascending aorta are structurally normal, with no evidence of dilitation. There is  moderate (Grade III) atheroma plaque involving the aortic arch. Venous: A normal flow pattern is recorded from the right upper pulmonary vein. There is late systolic flow reversal of hepatic vein suggestive noncompliant RV. The inferior vena cava is dilated in size with less than 50% respiratory variability, suggesting right atrial pressure of 15 mmHg. IAS/Shunts: No atrial level shunt detected by color flow Doppler.  LEFT VENTRICLE PLAX 2D                        Biplane EF (MOD) LVIDd:         5.40 cm         LV Biplane EF:   Left LVIDs:         3.00 cm                          ventricular LV PW:  1.30 cm                          ejection LV IVS:        1.10 cm                          fraction by LVOT diam:     2.00 cm                          2D MOD LV SV:         91                               biplane is LV SV Index:   43                               69.5 %. LVOT Area:     3.14 cm LV IVRT:       85 msec         Diastology                                LV e' medial:    10.80 cm/s                                LV E/e' medial:  14.6 LV Volumes (MOD)               LV e' lateral:   11.20 cm/s LV vol d, MOD    150.0 ml      LV E/e' lateral: 14.1 A2C: LV vol d, MOD    147.0 ml      2D Longitudinal A4C:                           Strain LV vol s, MOD    50.4 ml       2D Strain GLS   -26.4 % A2C:                           (A4C): LV vol s, MOD    39.2 ml       2D Strain GLS   -23.8 % A4C:                           (A3C): LV SV MOD A2C:   99.6 ml       2D Strain GLS   -22.7 % LV SV MOD A4C:   147.0 ml      (A2C): LV SV MOD BP:    103.3 ml      2D Strain GLS   -24.3 %                                Avg: RIGHT VENTRICLE             IVC RV S prime:     14.40 cm/s  IVC diam: 2.90 cm TAPSE (M-mode): 2.9 cm  PULMONARY VEINS                             Diastolic Velocity: 42.50 cm/s                             S/D Velocity:       1.40                             Systolic Velocity:  60.00  cm/s LEFT ATRIUM             Index        RIGHT ATRIUM           Index LA diam:        4.00 cm 1.90 cm/m   RA Area:     26.10 cm LA Vol (A2C):   95.1 ml 45.10 ml/m  RA Volume:   85.40 ml  40.50 ml/m LA Vol (A4C):   75.0 ml 35.57 ml/m LA Biplane Vol: 88.9 ml 42.16 ml/m  AORTIC VALVE AV Area (Vmax):    1.31 cm AV Area (Vmean):   1.49 cm AV Area (VTI):     1.45 cm AV Vmax:           314.00 cm/s AV Vmean:          187.750 cm/s AV VTI:            0.631 m AV Peak Grad:      39.4 mmHg AV Mean Grad:      19.0 mmHg LVOT Vmax:         131.00 cm/s LVOT Vmean:        89.300 cm/s LVOT VTI:          0.291 m LVOT/AV VTI ratio: 0.46  AORTA Ao Root diam: 3.30 cm Ao Asc diam:  3.70 cm MITRAL VALVE                TRICUSPID VALVE MV Area (PHT): 2.76 cm     TR Peak grad:   45.2 mmHg MV Decel Time: 275 msec     TR Vmax:        336.00 cm/s MV E velocity: 158.00 cm/s MV A velocity: 123.00 cm/s  SHUNTS MV E/A ratio:  1.28         Systemic VTI:  0.29 m                             Systemic Diam: 2.00 cm Joelle Azobou Tonleu Electronically signed by Joelle Cedars Tonleu Signature Date/Time: 04/25/2024/3:02:27 PM    Final    DG CHEST PORT 1 VIEW Result Date: 04/23/2024 CLINICAL DATA:  Endotracheal intubation EXAM: PORTABLE CHEST 1 VIEW COMPARISON:  April 22, 2024 FINDINGS: Stable cardiomegaly. Endotracheal tube is noted distal tip 1.5 cm above the carina. Right internal jugular Port-A-Cath is unchanged. Right lung is clear. Mild left retrocardiac opacity is noted concerning for possible atelectasis or infiltrate and associated effusion. IMPRESSION: Endotracheal tube is noted with distal tip 1.5 cm above the carina. Mild left retrocardiac opacity is noted concerning for possible atelectasis or infiltrate and associated effusion. Electronically Signed   By: Lynwood Landy Raddle M.D.   On: 04/23/2024 15:32   US  EKG SITE RITE Result Date: 04/23/2024 If Site Rite image not attached, placement could not  be confirmed due to current cardiac  rhythm.  DG Chest 2 View Result Date: 04/22/2024 CLINICAL DATA:  Shortness of breath EXAM: CHEST - 2 VIEW COMPARISON:  March 04, 2024 FINDINGS: Stable cardiomegaly. Right internal jugular port is unchanged. Both lungs are clear. The visualized skeletal structures are unremarkable. IMPRESSION: No active cardiopulmonary disease. Electronically Signed   By: Lynwood Landy Raddle M.D.   On: 04/22/2024 14:08    Microbiology: Results for orders placed or performed during the hospital encounter of 03/16/24  Aerobic/Anaerobic Culture w Gram Stain (surgical/deep wound)     Status: None   Collection Time: 03/16/24  8:08 AM   Specimen: PATH Soft tissue  Result Value Ref Range Status   Specimen Description TISSUE  Final   Special Requests RIGHT LEG WOUND  Final   Gram Stain   Final    RARE WBC PRESENT,BOTH PMN AND MONONUCLEAR RARE GRAM POSITIVE COCCI IN PAIRS    Culture   Final    RARE CORYNEBACTERIUM STRIATUM Standardized susceptibility testing for this organism is not available. NO ANAEROBES ISOLATED Performed at [facility], [address], [place], [place] [address]    Report Status 03/21/2024 FINAL  Final   *Note: Due to a large number of results and/or encounters for the requested time period, some results have not been displayed. A complete set of results can be found in Results Review.    Labs: CBC: Recent Labs  Lab 04/29/24 0516 04/30/24 1015 05/01/24 0401 05/02/24 1655 05/03/24 0103 05/05/24 1032  WBC 11.2* 11.1* 12.2*  --  11.0* 9.7  NEUTROABS  --   --  10.0*  --  8.8* 8.1*  HGB 7.3* 7.5* 7.6* 7.1* 7.0* 7.4*  HCT 24.3* 24.4* 25.1* 23.9* 24.3* 25.3*  MCV 89.0 88.1 90.0  --  91.7 89.4  PLT 302 323 336  --  329 389   Basic Metabolic Panel: Recent Labs  Lab 04/30/24 1015 05/03/24 0103 05/04/24 0136 05/05/24 0145  NA 139 139 139 138  K 3.7 3.8 3.9 4.2  CL 106 105 106 105  CO2 24 25 21* 24  GLUCOSE 122* 116* 142* 125*  BUN 10 11 10 10   CREATININE 0.75 0.83 0.77  0.77  CALCIUM  8.5* 8.3* 8.6* 8.4*   Liver Function Tests: No results for input(s): AST, ALT, ALKPHOS, BILITOT, PROT, ALBUMIN in the last 168 hours. CBG: Recent Labs  Lab 05/01/24 2020 05/02/24 0825 05/02/24 1948 05/03/24 0821 05/05/24 0832  GLUCAP 115* 110* 118* 98 119*    Discharge time spent: approximately 45 minutes spent on discharge counseling, evaluation of patient on day of discharge, and coordination of discharge planning with nursing, social work, pharmacy and case management  Signed: Lonni SHAUNNA Dalton, MD [facility] 05/05/2024         "

## 2024-05-05 NOTE — Progress Notes (Signed)
 Physical Therapy Treatment Patient Details Name: Peggy House MRN: [id number] DOB: May 31, 1960 Today's Date: 05/05/2024   History of Present Illness 64 y.o. female admitted 04/22/24 with DOE, fatigue, several nosebleeds, black tarry stools. Workup for anemia, GIB, significant epistaxis with bloody aspiration. ETT 1/9-1/10. PMH includes hereditary hemorrhagic telangiectasia (off VEG-F inhibitor due to healing issues after ankle sx), multiple R ankle sxs with impaired healing (04/2023-07/2023), HTN, DM2, GIB, tobacco use.    PT Comments  Pt received in the recliner and agreeable to session. Pt reports improved pain this session and is able to progress gait training. Pt able to perform 2 short gait trials and seated BLE exercises. Pt reports L knee arthritis and difficulty with clearance, but is able to improve with increased effort and cues. Pt continues to benefit from PT services to progress toward functional mobility goals.     If plan is discharge home, recommend the following: A little help with walking and/or transfers;A little help with bathing/dressing/bathroom;Assistance with cooking/housework;Assist for transportation;Help with stairs or ramp for entrance   Can travel by private vehicle     Yes  Equipment Recommendations  None recommended by PT    Recommendations for Other Services       Precautions / Restrictions Precautions Precautions: Fall;Other (comment) Recall of Precautions/Restrictions: Intact Precaution/Restrictions Comments: h/o R ankle injury with multiple sxs (wound currently wrapped in gauze) Restrictions Weight Bearing Restrictions Per Provider Order: No     Mobility  Bed Mobility               General bed mobility comments: Pt in recliner at beginning and end of session    Transfers Overall transfer level: Needs assistance Equipment used: Rolling walker (2 wheels) Transfers: Sit to/from Stand Sit to Stand: Min assist, Mod assist            General transfer comment: Cues for hand placement and mod A progressing to min A for power up from recliner. Increased difficulty transitioning BUE to RW    Ambulation/Gait Ambulation/Gait assistance: Contact guard assist Gait Distance (Feet): 20 Feet (x2) Assistive device: Rolling walker (2 wheels) Gait Pattern/deviations: Step-to pattern, Decreased stance time - right, Decreased stride length, Decreased weight shift to right, Shuffle, Decreased step length - left Gait velocity: decreased     General Gait Details: Pt initially slides L foot forward, but is able to improve clearance during second trial with cues. heavy reliance on RW support   Stairs             Wheelchair Mobility     Tilt Bed    Modified Rankin (Stroke Patients Only)       Balance Overall balance assessment: Needs assistance Sitting-balance support: No upper extremity supported, Feet supported Sitting balance-Leahy Scale: Fair     Standing balance support: Bilateral upper extremity supported, During functional activity, Reliant on assistive device for balance Standing balance-Leahy Scale: Poor Standing balance comment: reliant on RW support                            Communication Communication Communication: No apparent difficulties  Cognition Arousal: Alert Behavior During Therapy: WFL for tasks assessed/performed   PT - Cognitive impairments: No apparent impairments                         Following commands: Intact      Cueing Cueing Techniques: Verbal cues  Exercises General Exercises - Lower  Extremity Long Arc Quad: AROM, Seated, Both, 10 reps Hip Flexion/Marching: AROM, Standing, Both, 10 reps    General Comments        Pertinent Vitals/Pain Pain Assessment Pain Assessment: Faces Faces Pain Scale: Hurts a little bit Pain Location: R ankle Pain Descriptors / Indicators: Discomfort, Grimacing Pain Intervention(s): Limited activity within patient's  tolerance, Monitored during session, Repositioned     PT Goals (current goals can now be found in the care plan section) Acute Rehab PT Goals Patient Stated Goal: to return home PT Goal Formulation: With patient Time For Goal Achievement: 05/09/24 Progress towards PT goals: Progressing toward goals    Frequency    Min 2X/week       AM-PAC PT 6 Clicks Mobility   Outcome Measure  Help needed turning from your back to your side while in a flat bed without using bedrails?: A Little Help needed moving from lying on your back to sitting on the side of a flat bed without using bedrails?: A Little Help needed moving to and from a bed to a chair (including a wheelchair)?: A Little Help needed standing up from a chair using your arms (e.g., wheelchair or bedside chair)?: A Little Help needed to walk in hospital room?: A Little Help needed climbing 3-5 steps with a railing? : Total 6 Click Score: 16    End of Session Equipment Utilized During Treatment: Gait belt Activity Tolerance: Patient tolerated treatment well Patient left: with call bell/phone within reach;in chair Nurse Communication: Mobility status PT Visit Diagnosis: Other abnormalities of gait and mobility (R26.89);Muscle weakness (generalized) (M62.81);Pain Pain - Right/Left: Right Pain - part of body: Ankle and joints of foot     Time: 8497-8471 PT Time Calculation (min) (ACUTE ONLY): 26 min  Charges:    $Gait Training: 8-22 mins $Therapeutic Activity: 8-22 mins PT General Charges $$ ACUTE PT VISIT: 1 Visit                    Darryle George, PTA Acute Rehabilitation Services Secure Chat Preferred  [phone]    Darryle George 05/05/2024, 3:56 PM

## 2024-05-05 NOTE — Progress Notes (Signed)
 Occupational Therapy Treatment Patient Details Name: Peggy House MRN: [id number] DOB: 01-29-1961 Today's Date: 05/05/2024   History of present illness 64 y.o. female admitted 04/22/24 with DOE, fatigue, several nosebleeds, black tarry stools. Workup for anemia, GIB, significant epistaxis with bloody aspiration. ETT 1/9-1/10. PMH includes hereditary hemorrhagic telangiectasia (off VEG-F inhibitor due to healing issues after ankle sx), multiple R ankle sxs with impaired healing (04/2023-07/2023), HTN, DM2, GIB, tobacco use.   OT comments  Pt making steady progress towards OT goals this session. Pt continues to present with decreased activity tolerance and generalized deconditioning . Session focus on BADL reeducation and ADL transfers. Pt currently requires MIN A for ADL transfers with RW, set- up for UB ADLs and MAX A for LB ADLs. Patient will benefit from continued inpatient follow up therapy, <3 hours/day        If plan is discharge home, recommend the following:  A lot of help with walking and/or transfers;A lot of help with bathing/dressing/bathroom;Assistance with cooking/housework   Equipment Recommendations  None recommended by OT    Recommendations for Other Services      Precautions / Restrictions Precautions Precautions: Fall;Other (comment) Recall of Precautions/Restrictions: Intact Precaution/Restrictions Comments: h/o R ankle injury with multiple sxs (wound currently wrapped in gauze) Restrictions Weight Bearing Restrictions Per Provider Order: No       Mobility Bed Mobility Overal bed mobility: Needs Assistance Bed Mobility: Supine to Sit     Supine to sit: Min assist, HOB elevated, Used rails     General bed mobility comments: light MIN A to elevate trunk and initial steadying assist when transitioning    Transfers Overall transfer level: Needs assistance Equipment used: Rolling walker (2 wheels) Transfers: Sit to/from Stand, Bed to  chair/wheelchair/BSC Sit to Stand: Min assist, Contact guard assist     Step pivot transfers: Min assist     General transfer comment: pt needed increased time to initially stand from EOB but able to stand with CGA. pt completed first transfer from EOB>BSC to R side with light MINA and no AD, cues needed for hand placement. pt completed sit>stand from bari [facility] with MIN A with cues for hand placment, pt able to pivot to recliner with MIN A for steadying assist. cues needed for hand placement when reaching back to sit.     Balance Overall balance assessment: Needs assistance Sitting-balance support: No upper extremity supported, Feet supported Sitting balance-Leahy Scale: Fair Sitting balance - Comments: sat EOB for ADLs with supervision   Standing balance support: Bilateral upper extremity supported, During functional activity Standing balance-Leahy Scale: Poor Standing balance comment: Reliant on BUE support but able to static stand for ~ 1 min for ADL participation                           ADL either performed or assessed with clinical judgement   ADL Overall ADL's : Needs assistance/impaired     Grooming: Wash/dry face;Sitting;Set up   Upper Body Bathing: Supervision/ safety;Sitting Upper Body Bathing Details (indicate cue type and reason): sitting EOB to wash UB Lower Body Bathing: Maximal assistance;Sit to/from stand Lower Body Bathing Details (indicate cue type and reason): simulated via posterior pericare Upper Body Dressing : Set up;Sitting Upper Body Dressing Details (indicate cue type and reason): to don new gown Lower Body Dressing: Maximal assistance;Sitting/lateral leans Lower Body Dressing Details (indicate cue type and reason): to don socks from EOB Toilet Transfer: Minimal assistance;Stand-pivot;Rolling walker (2 wheels);Cueing for  Chief Of Staff Details (indicate cue type and reason): stand pivot to [facility] and recliner, cues for safety awareness  and hand placement Toileting- Clothing Manipulation and Hygiene: Maximal assistance;Sit to/from stand Toileting - Clothing Manipulation Details (indicate cue type and reason): attempted to complete posterior pericare from sitting, unable to complete in sitting, needed to stand needing MAX A     Functional mobility during ADLs: Minimal assistance;Cueing for safety;Rolling walker (2 wheels) (stand pivot only) General ADL Comments: ADL participation impacted by generalized deconditioning and impaired functional use of LLE    Extremity/Trunk Assessment Upper Extremity Assessment Upper Extremity Assessment: Generalized weakness   Lower Extremity Assessment RLE Deficits / Details: h/o multiple R ankle sxs with chronic wound currently wrapped in gauze   Cervical / Trunk Assessment Cervical / Trunk Assessment: Kyphotic    Vision Baseline Vision/History: 0 No visual deficits Vision Assessment?: No apparent visual deficits   Perception Perception Perception: Within Functional Limits   Praxis Praxis Praxis: WFL   Communication Communication Communication: No apparent difficulties   Cognition Arousal: Alert Behavior During Therapy: WFL for tasks assessed/performed Cognition: Cognition impaired           Executive functioning impairment (select all impairments): Problem solving OT - Cognition Comments: Poor safety awareness and insight into deficits                 Following commands: Intact        Cueing   Cueing Techniques: Verbal cues  Exercises      Shoulder Instructions       General Comments      Pertinent Vitals/ Pain       Pain Assessment Pain Assessment: No/denies pain  Home Living                                          Prior Functioning/Environment              Frequency  Min 2X/week        Progress Toward Goals  OT Goals(current goals can now be found in the care plan section)  Progress towards OT goals:  Progressing toward goals  Acute Rehab OT Goals Patient Stated Goal: to go to rehab OT Goal Formulation: With patient Time For Goal Achievement: 05/10/24 Potential to Achieve Goals: Good  Plan      Co-evaluation                 AM-PAC OT 6 Clicks Daily Activity     Outcome Measure   Help from another person eating meals?: None Help from another person taking care of personal grooming?: A Little Help from another person toileting, which includes using toliet, bedpan, or urinal?: A Lot Help from another person bathing (including washing, rinsing, drying)?: A Little Help from another person to put on and taking off regular upper body clothing?: A Little Help from another person to put on and taking off regular lower body clothing?: A Lot 6 Click Score: 17    End of Session Equipment Utilized During Treatment: Gait belt;Rolling walker (2 wheels)  OT Visit Diagnosis: Unsteadiness on feet (R26.81);Other abnormalities of gait and mobility (R26.89);Muscle weakness (generalized) (M62.81)   Activity Tolerance Patient tolerated treatment well   Patient Left in chair;with call bell/phone within reach   Nurse Communication Mobility status;Other (comment) (recommended trying to use BSC today vs purewick)  Time: 9191-9148 OT Time Calculation (min): 43 min  Charges: OT General Charges $OT Visit: 1 Visit OT Treatments $Self Care/Home Management : 38-52 mins Ronal Mallie POUR., COTA/L Acute Rehabilitation Services [phone]   Ronal Mallie Needy 05/05/2024, 10:48 AM

## 2024-05-06 ENCOUNTER — Ambulatory Visit: Admitting: Internal Medicine

## 2024-05-06 DIAGNOSIS — D649 Anemia, unspecified: Secondary | ICD-10-CM | POA: Diagnosis not present

## 2024-05-06 LAB — BASIC METABOLIC PANEL WITH GFR
Anion gap: 12 (ref 5–15)
BUN: 11 mg/dL (ref 8–23)
CO2: 22 mmol/L (ref 22–32)
Calcium: 8.6 mg/dL — ABNORMAL LOW (ref 8.9–10.3)
Chloride: 106 mmol/L (ref 98–111)
Creatinine, Ser: 0.72 mg/dL (ref 0.44–1.00)
GFR, Estimated: >60 mL/min
Glucose, Bld: 106 mg/dL — ABNORMAL HIGH (ref 70–99)
Potassium: 3.8 mmol/L (ref 3.5–5.1)
Sodium: 140 mmol/L (ref 135–145)

## 2024-05-06 LAB — CBC
HCT: 24.4 % — ABNORMAL LOW (ref 36.0–46.0)
Hemoglobin: 7.3 g/dL — ABNORMAL LOW (ref 12.0–15.0)
MCH: 26.5 pg (ref 26.0–34.0)
MCHC: 29.9 g/dL — ABNORMAL LOW (ref 30.0–36.0)
MCV: 88.7 fL (ref 80.0–100.0)
Platelets: 345 K/uL (ref 150–400)
RBC: 2.75 MIL/uL — ABNORMAL LOW (ref 3.87–5.11)
RDW: 18.2 % — ABNORMAL HIGH (ref 11.5–15.5)
WBC: 8.7 K/uL (ref 4.0–10.5)
nRBC: 0 % (ref 0.0–0.2)

## 2024-05-06 LAB — GLUCOSE, CAPILLARY
Glucose-Capillary: 132 mg/dL — ABNORMAL HIGH (ref 70–99)
Glucose-Capillary: 128 mg/dL — ABNORMAL HIGH (ref 70–99)
Glucose-Capillary: 112 mg/dL — ABNORMAL HIGH (ref 70–99)

## 2024-05-06 LAB — HEMOGLOBIN AND HEMATOCRIT, BLOOD
HCT: 24.1 % — ABNORMAL LOW (ref 36.0–46.0)
Hemoglobin: 7 g/dL — ABNORMAL LOW (ref 12.0–15.0)

## 2024-05-06 MED ORDER — OXYCODONE-ACETAMINOPHEN 7.5-325 MG PO TABS
1.0000 | ORAL_TABLET | ORAL | Status: DC | PRN
  Administered 2024-05-06 – 2024-05-12 (×24): 1 via ORAL
  Filled 2024-05-06 (×24): qty 1

## 2024-05-06 MED ORDER — OXYMETAZOLINE HCL 0.05 % NA SOLN
1.0000 | Freq: Two times a day (BID) | NASAL | Status: AC | PRN
  Administered 2024-05-06: 1 via NASAL
  Filled 2024-05-06: qty 30

## 2024-05-06 MED ORDER — TRANEXAMIC ACID FOR EPISTAXIS
500.0000 mg | Freq: Once | TOPICAL | Status: AC
  Administered 2024-05-06: 500 mg via TOPICAL
  Filled 2024-05-06: qty 10

## 2024-05-06 NOTE — Plan of Care (Signed)
" °  Problem: Education: Goal: Knowledge of General Education information will improve Description: Including pain rating scale, medication(s)/side effects and non-pharmacologic comfort measures Outcome: Progressing   Problem: Clinical Measurements: Goal: Ability to maintain clinical measurements within normal limits will improve Outcome: Progressing Goal: Will remain free from infection Outcome: Progressing Goal: Diagnostic test results will improve Outcome: Progressing Goal: Respiratory complications will improve Outcome: Progressing Goal: Cardiovascular complication will be avoided Outcome: Progressing   Problem: Activity: Goal: Risk for activity intolerance will decrease Outcome: Progressing   Problem: Nutrition: Goal: Adequate nutrition will be maintained Outcome: Progressing   Problem: Coping: Goal: Level of anxiety will decrease Outcome: Progressing   Problem: Elimination: Goal: Will not experience complications related to bowel motility Outcome: Progressing Goal: Will not experience complications related to urinary retention Outcome: Progressing   Problem: Pain Managment: Goal: General experience of comfort will improve and/or be controlled Outcome: Progressing   Problem: Safety: Goal: Ability to remain free from injury will improve Outcome: Progressing   Problem: Skin Integrity: Goal: Risk for impaired skin integrity will decrease Outcome: Progressing   Problem: Activity: Goal: Ability to tolerate increased activity will improve Outcome: Progressing   Problem: Respiratory: Goal: Ability to maintain a clear airway and adequate ventilation will improve Outcome: Progressing   Problem: Role Relationship: Goal: Method of communication will improve Outcome: Progressing   "

## 2024-05-06 NOTE — TOC Progression Note (Signed)
 Transition of Care [facility]) - Progression Note    Patient Details  Name: KAHLEN BOYDE MRN: [id number] Date of Birth: Aug 26, 1960  [facility] CM/SW Contact  Sherline Clack, [place] Phone Number: 05/06/2024, 12:39 PM  Clinical Narrative:     CSW reached out to [facility] to ask for updates regarding patient's ability to transfer. Patient is not able to transfer today and admissions person requests CSW reach out again tomorrow. CSW will call [facility] admissions person tomorrow for an update and will update DC plan. Provider aware.   Expected Discharge Plan: Skilled Nursing Facility Barriers to Discharge: Pending SNF Bed Offer               Expected Discharge Plan and Services   Discharge Planning Services: CM Consult   Living arrangements for the past 2 months: Single Family Home Expected Discharge Date: 05/05/24                                     Social Drivers of Health (SDOH) Interventions SDOH Screenings   Food Insecurity: No Food Insecurity (04/22/2024)  Housing: Low Risk (04/22/2024)  Transportation Needs: No Transportation Needs (04/22/2024)  Utilities: Not At Risk (04/22/2024)  Depression (PHQ2-9): Low Risk (03/20/2024)  Tobacco Use: High Risk (04/23/2024)    Readmission Risk Interventions    04/18/2023    3:07 PM 04/15/2023    2:22 PM  Readmission Risk Prevention Plan  Transportation Screening Complete Complete  PCP or Specialist Appt within 5-7 Days  Complete  PCP or Specialist Appt within 3-5 Days Complete   Home Care Screening  Complete  Medication Review (RN CM)  Complete  HRI or Home Care Consult Complete   Social Work Consult for Recovery Care Planning/Counseling Complete   Palliative Care Screening Complete   Medication Review Oceanographer) Complete

## 2024-05-06 NOTE — Progress Notes (Signed)
 Pt arrived from 2W via bed with RN at this time.  Pt met at the bedside upon pt arrival.  Pt placed on tele, verified by this RN and NT.  VS obtained.  Fall band placed to right wrist and fall mat placed on the floor.  Bed alarm on for  safety and call bell within reach.  No signs of a nosebleed at this time.  Pt denies pain or SOB.  All questions answered.  Report to be given to next shift RN

## 2024-05-06 NOTE — Plan of Care (Signed)
  Problem: Education: Goal: Knowledge of General Education information will improve Description: Including pain rating scale, medication(s)/side effects and non-pharmacologic comfort measures Outcome: Progressing   Problem: Health Behavior/Discharge Planning: Goal: Ability to manage health-related needs will improve Outcome: Progressing   Problem: Clinical Measurements: Goal: Ability to maintain clinical measurements within normal limits will improve Outcome: Progressing Goal: Will remain free from infection Outcome: Progressing Goal: Diagnostic test results will improve Outcome: Progressing Goal: Respiratory complications will improve Outcome: Progressing Goal: Cardiovascular complication will be avoided Outcome: Progressing   Problem: Activity: Goal: Risk for activity intolerance will decrease Outcome: Progressing   Problem: Nutrition: Goal: Adequate nutrition will be maintained Outcome: Progressing   Problem: Coping: Goal: Level of anxiety will decrease Outcome: Progressing   Problem: Elimination: Goal: Will not experience complications related to bowel motility Outcome: Progressing Goal: Will not experience complications related to urinary retention Outcome: Progressing   Problem: Pain Managment: Goal: General experience of comfort will improve and/or be controlled Outcome: Progressing   Problem: Safety: Goal: Ability to remain free from injury will improve Outcome: Progressing   Problem: Skin Integrity: Goal: Risk for impaired skin integrity will decrease Outcome: Progressing   Problem: Activity: Goal: Ability to tolerate increased activity will improve Outcome: Progressing   Problem: Respiratory: Goal: Ability to maintain a clear airway and adequate ventilation will improve Outcome: Progressing   Problem: Role Relationship: Goal: Method of communication will improve Outcome: Progressing

## 2024-05-06 NOTE — Progress Notes (Addendum)
" °  Progress Note   Patient: Peggy House FMW:[id number] DOB: 1961-02-26 DOA: 04/22/2024     14 DOS: the patient was seen and examined on 05/06/2024 at 7:54 AM      Brief hospital course: 64 y.o. F with hereditary telangiectasias, hx AVMs, iron  deficiency anemia, DM, obesity and chronic osteomyelitis of the right fibula who presented with weakness, nosebleed and melena. Admitted and found to have Hgb 4.1.      Assessment and Plan: Melena and GI bleeding due to AVMs Anemia Hemoglobin stable, no clinical bleeding reported or observed - Continue PPI twice daily - Continue Carafate       Severe epistaxis, acute blood loss anemia, POA: No further nosebleeds - Outpatient follow-up - Continue Amicar        Hereditary hemorrhagic telangiectasias, POA: - Follow-up with hematology     Essential hypertension, POA: Blood pressure controlled - Continue amlodipine  and lisinopril     Chronic OM of right tib/fibula status post removal of hardware and I&D, POA:  -Continue vancomycin  - Needs to reschedule ID follow-up   Chronic pain Mood disorder - Continue oxycodone , gabapentin , Xanax , trazodone   Class 3 obesity        Subjective: Having pain in her right ankle.  No fever, no respiratory symptoms.  No new nursing concerns.     Physical Exam: BP (!) 112/55 (BP Location: Left Arm)   Pulse (!) 57   Temp 98.1 F (36.7 C)   Resp 20   Ht 5' 4 (1.626 m)   Wt 119.2 kg   SpO2 98%   BMI 45.11 kg/m   Obese adult female, sitting up in bed, interactive and appropriate RRR, SEM noted, no peripheral edema Respiratory rate normal, lungs clear without rales or wheezes Abdomen soft, no tenderness palpation or guarding, no ascites or distention The right ankle appears normal, wrapped in gauze Attention normal, affect appropriate, judgment and insight appear normal    Data Reviewed: CBC shows stable anemia        Disposition: Status is:  Inpatient         Author: Lonni SHAUNNA Dalton, MD 05/06/2024 12:41 PM  For on call review [email or web address].    "

## 2024-05-07 DIAGNOSIS — D649 Anemia, unspecified: Secondary | ICD-10-CM | POA: Diagnosis not present

## 2024-05-07 LAB — BASIC METABOLIC PANEL WITH GFR
Anion gap: 9 (ref 5–15)
BUN: 10 mg/dL (ref 8–23)
CO2: 22 mmol/L (ref 22–32)
Calcium: 8.5 mg/dL — ABNORMAL LOW (ref 8.9–10.3)
Chloride: 109 mmol/L (ref 98–111)
Creatinine, Ser: 0.88 mg/dL (ref 0.44–1.00)
GFR, Estimated: >60 mL/min
Glucose, Bld: 110 mg/dL — ABNORMAL HIGH (ref 70–99)
Potassium: 3.8 mmol/L (ref 3.5–5.1)
Sodium: 140 mmol/L (ref 135–145)

## 2024-05-07 LAB — CBC
HCT: 24.2 % — ABNORMAL LOW (ref 36.0–46.0)
Hemoglobin: 6.9 g/dL — CL (ref 12.0–15.0)
MCH: 25.2 pg — ABNORMAL LOW (ref 26.0–34.0)
MCHC: 28.5 g/dL — ABNORMAL LOW (ref 30.0–36.0)
MCV: 88.3 fL (ref 80.0–100.0)
Platelets: 321 K/uL (ref 150–400)
RBC: 2.74 MIL/uL — ABNORMAL LOW (ref 3.87–5.11)
RDW: 18.4 % — ABNORMAL HIGH (ref 11.5–15.5)
WBC: 7.8 K/uL (ref 4.0–10.5)
nRBC: 0.3 % — ABNORMAL HIGH (ref 0.0–0.2)

## 2024-05-07 LAB — GLUCOSE, CAPILLARY: Glucose-Capillary: 103 mg/dL — ABNORMAL HIGH (ref 70–99)

## 2024-05-07 LAB — PREPARE RBC (CROSSMATCH)

## 2024-05-07 MED ORDER — SODIUM CHLORIDE 0.9% IV SOLUTION: Freq: Once | INTRAVENOUS | Status: AC

## 2024-05-07 MED ORDER — DIPHENHYDRAMINE HCL 25 MG PO CAPS
25.0000 mg | ORAL_CAPSULE | Freq: Once | ORAL | Status: AC
  Administered 2024-05-07: 25 mg via ORAL
  Filled 2024-05-07: qty 1

## 2024-05-07 MED ORDER — NICOTINE 14 MG/24HR TD PT24
14.0000 mg | MEDICATED_PATCH | Freq: Every day | TRANSDERMAL | Status: DC
  Administered 2024-05-07 – 2024-05-13 (×7): 14 mg via TRANSDERMAL
  Filled 2024-05-07 (×7): qty 1

## 2024-05-07 MED ORDER — ACETAMINOPHEN 325 MG PO TABS
650.0000 mg | ORAL_TABLET | Freq: Once | ORAL | Status: AC
  Administered 2024-05-07: 650 mg via ORAL
  Filled 2024-05-07: qty 2

## 2024-05-07 NOTE — TOC Progression Note (Signed)
 Transition of Care [facility]) - Progression Note    Patient Details  Name: Peggy House MRN: [id number] Date of Birth: [birth year]  [facility]) CM/SW Contact  Peggy House, [place] Phone Number: 05/07/2024, 9:47 AM  Clinical Narrative:   CSW spoke with Admissions at [facility] authorization is pending at this time. CSW to follow.    Expected Discharge Plan: Skilled Nursing Facility Barriers to Discharge: Continued Medical Work up, English As A Second Language Teacher               Expected Discharge Plan and Services   Discharge Planning Services: CM Consult   Living arrangements for the past 2 months: Single Family Home Expected Discharge Date: 05/05/24                                     Social Drivers of Health (SDOH) Interventions SDOH Screenings   Food Insecurity: No Food Insecurity (04/22/2024)  Housing: Low Risk (04/22/2024)  Transportation Needs: No Transportation Needs (04/22/2024)  Utilities: Not At Risk (04/22/2024)  Depression (PHQ2-9): Low Risk (03/20/2024)  Tobacco Use: High Risk (04/23/2024)    Readmission Risk Interventions    04/18/2023    3:07 PM 04/15/2023    2:22 PM  Readmission Risk Prevention Plan  Transportation Screening Complete Complete  PCP or Specialist Appt within 5-7 Days  Complete  PCP or Specialist Appt within 3-5 Days Complete   Home Care Screening  Complete  Medication Review (RN CM)  Complete  HRI or Home Care Consult Complete   Social Work Consult for Recovery Care Planning/Counseling Complete   Palliative Care Screening Complete   Medication Review Oceanographer) Complete

## 2024-05-07 NOTE — Progress Notes (Signed)
 Occupational Therapy Treatment Patient Details Name: Peggy House MRN: [id number] DOB: [birth year] Today's Date: 05/07/2024   History of present illness 64 y.o. female admitted 04/22/24 with DOE, fatigue, several nosebleeds, black tarry stools. Workup for anemia, GIB, significant epistaxis with bloody aspiration. ETT 1/9-1/10. PMH includes hereditary hemorrhagic telangiectasia (off VEG-F inhibitor due to healing issues after ankle sx), multiple R ankle sxs with impaired healing (04/2023-07/2023), HTN, DM2, GIB, tobacco use.   OT comments  Pt. Seen for skilled OT treatment session. Cont. Work on landscape architect and activity tolerance during mobility and ADLs.  Bed mobility with CGA.  LB dressing seated eob, set up for slippers.  Sit/stand MIN A but progressed to CGA during step pivot/short distance ambulation with cues for posture and looking ahead vs. Down.  Pt. Making gains with acute OT goals.  Cont. With current d/c recommendations and POC.        If plan is discharge home, recommend the following:  A lot of help with walking and/or transfers;A lot of help with bathing/dressing/bathroom;Assistance with cooking/housework   Equipment Recommendations  None recommended by OT    Recommendations for Other Services      Precautions / Restrictions Precautions Precautions: Fall;Other (comment) Precaution/Restrictions Comments: h/o R ankle injury with multiple sxs (wound currently wrapped in gauze)       Mobility Bed Mobility Overal bed mobility: Needs Assistance Bed Mobility: Supine to Sit     Supine to sit: HOB elevated, Used rails, Contact guard          Transfers Overall transfer level: Needs assistance Equipment used: Rolling walker (2 wheels) Transfers: Sit to/from Stand, Bed to chair/wheelchair/BSC Sit to Stand: Min assist, From elevated surface     Step pivot transfers: Contact guard assist     General transfer comment: pt. attempting rocking motion for  sit/stand and was unable to achieve staning on 1st attempt.  bed height elevated and cues for pushing through BUEs and pt. was able to achieve standing with MIN A.  step pivot to R to recliner with max cues for posture and head upright.  pt. receptive and had notable improvements with mobility when implementing these cues     Balance                                           ADL either performed or assessed with clinical judgement   ADL Overall ADL's : Needs assistance/impaired                     Lower Body Dressing: Set up;Sitting/lateral leans Lower Body Dressing Details (indicate cue type and reason): don house backless house shoes seated eob.  reviewed benefits of having shoes/slippers with backs to reduce strain on B feet during mobility Toilet Transfer: Contact guard assist;Cueing for sequencing;Ambulation;Rolling walker (2 wheels) Toilet Transfer Details (indicate cue type and reason): step pivot to recliner, cues for safety awareness and hand placement-max cues for upright posture and keeping head up.                Extremity/Trunk Assessment              Vision       Restaurant Manager, [employer] Communication: No apparent difficulties   Cognition Arousal: Alert Behavior During Therapy: WFL for tasks assessed/performed Cognition: Cognition impaired  Executive functioning impairment (select all impairments): Problem solving OT - Cognition Comments: Poor safety awareness and insight into deficits                 Following commands: Intact        Cueing   Cueing Techniques: Verbal cues  Exercises      Shoulder Instructions       General Comments      Pertinent Vitals/ Pain       Pain Assessment Pain Assessment: No/denies pain  Home Living                                          Prior Functioning/Environment              Frequency  Min 2X/week         Progress Toward Goals  OT Goals(current goals can now be found in the care plan section)  Progress towards OT goals: Progressing toward goals     Plan      Co-evaluation                 AM-PAC OT 6 Clicks Daily Activity     Outcome Measure   Help from another person eating meals?: None Help from another person taking care of personal grooming?: A Little Help from another person toileting, which includes using toliet, bedpan, or urinal?: A Lot Help from another person bathing (including washing, rinsing, drying)?: A Little Help from another person to put on and taking off regular upper body clothing?: A Little Help from another person to put on and taking off regular lower body clothing?: A Lot 6 Click Score: 17    End of Session Equipment Utilized During Treatment: Rolling walker (2 wheels)  OT Visit Diagnosis: Unsteadiness on feet (R26.81);Other abnormalities of gait and mobility (R26.89);Muscle weakness (generalized) (M62.81)   Activity Tolerance Patient tolerated treatment well   Patient Left in chair;with call bell/phone within reach;with chair alarm set   Nurse Communication Mobility status;Other (comment) (rn states ok to work with pt.)        Time: [phone] OT Time Calculation (min): 19 min  Charges: OT General Charges $OT Visit: 1 Visit OT Treatments $Self Care/Home Management : 8-22 mins  Randall, COTA/L Acute Rehabilitation [phone]   Peggy House  05/07/2024, 8:15 AM

## 2024-05-07 NOTE — Progress Notes (Signed)
" °  Progress Note   Patient: Peggy House FMW:[id number] DOB: 13-Mar-1961 DOA: 04/22/2024     15 DOS: the patient was seen and examined on 05/07/2024 at 7:54 AM      Brief hospital course: 64 y.o. F with hereditary telangiectasias, hx AVMs, iron  deficiency anemia, DM, obesity and chronic osteomyelitis of the right fibula who presented with weakness, nosebleed and melena. Admitted and found to have Hgb 4.1.      Assessment and Plan: Melena and GI bleeding due to AVMs Anemia Hemoglobin stable, no clinical bleeding reported or observed - Continue PPI twice daily - Continue Carafate       Severe epistaxis, acute blood loss anemia, POA: - Transfuse 1 unit today If further bleeding: Avoid manipulation of the nose Apply Afrin spray If ineffective, apply Afrin to gauze and pack lightly If ineffective, apply TXA to gauze and apply lightly If ineffective, would require Nasopore per ENT - Trend Hgb - Repeat IV iron  pending (gets Monoferric  1000 mg q14 days normally.  Last dose here on 1/16 and 1/14 (got 375 mg ferrlecit  at that time) - Outpatient follow-up - Continue Amicar        Hereditary hemorrhagic telangiectasias, POA: - Follow-up with hematology     Essential hypertension, POA: Blood pressure controlled - Continue amlodipine  and lisinopril     Chronic OM of right tib/fibula status post removal of hardware and I&D, POA:  -Continue vancomycin  - Needs to reschedule ID follow-up   Chronic pain Mood disorder - Continue oxycodone , gabapentin , Xanax , trazodone   Class 3 obesity        Subjective: Having pain in her right ankle.  No fever, no respiratory symptoms.  No new nursing concerns.  Transufsed today.     Physical Exam: BP (!) 111/59 (BP Location: Left Arm)   Pulse 65   Temp 97.8 F (36.6 C)   Resp 18   Ht 5' 4 (1.626 m)   Wt 120.1 kg   SpO2 99%   BMI 45.45 kg/m   Obese adult female, sitting up in bed, interactive and appropriate RRR, SEM  noted, no peripheral edema Respiratory rate normal, lungs clear without rales or wheezes Abdomen soft, no tenderness palpation or guarding, no ascites or distention The right ankle appears normal, wrapped in gauze Attention normal, affect appropriate, judgment and insight appear normal    Data Reviewed: Anemia worse Transfused today.       Disposition: Status is: Inpatient         Author: Lonni SHAUNNA Dalton, MD 05/07/2024 5:56 PM  For on call review [email or web address].    "

## 2024-05-07 NOTE — Progress Notes (Addendum)
 OREL HORD   DOB:1960/10/11   FM#:[id number]      CLINICAL SUMMARY:  Teckla Christiansen Isley-Wansleyis a 64 year old female patient  admitted 04/22/2024 with complaints of generalized weakness, fatigue and dizziness. Reported nosebleed 3 days prior with episode of black stool. 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 Medical hematology following closely.    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  Required multiple transfusions and APC's since 2019. - Extensive workup confirmed HHT based on personal and family history. - Status post frequent blood transfusions, IV iron  and bevacizumab  with good response.  However patient has been noncompliant with outpatient appointments.    ASSESSMENT & PLAN:  Hereditary hemorrhagic telangiectasia HHT Severe epistaxis GI bleeding, recurrent - Admitted 04/22/2024 with complaints of weakness fatigue and dizziness.  Noted to have severe epistaxis 04/26/2024.  Seen by ENT with packing both nostrils done at that time. - Again noted to have severe epistaxis 05/02/2024. -Received bevacizumab  04/28/2024 and tolerated well.  Next dosage due 05/12/2024.  Patient is agreeable to therapy as planned. - On Amicar , continue as ordered - Pending discharge to SNF - Medical hematology/Dr. Lanny following closely  Anemia Iron  deficiency anemia - Likely related to epistaxis and GI bleeding - Hemoglobin was 6.9, seen with PRBC transfusing well today. - Status post IV iron  infusions this admission. - IV iron  levels rechecked 1/20: Ferritin suboptimal 67, iron  low 17 with sat low 6%.  Consideration for additional IV iron  infusion prior to discharge. - Recommend PRBC transfusion for hemoglobin <7.0 - Continue to monitor CBC with differential  RLE chronic wound - Status post removal of hardware with I&D - Continue Vanco x 6 weeks - Continue  supportive care     Code Status Full  Subjective:  Patient seen awake and alert sitting up in chair at bedside.  PRBC transfusion ongoing, tolerating well.  States that she continues to feel weak.  Denies pain, shortness of breath or other acute complaints.  Objective:   Intake/Output Summary (Last 24 hours) at 05/07/2024 1124 Last data filed at 05/07/2024 0400 Gross per 24 hour  Intake 240 ml  Output 450 ml  Net -210 ml     PHYSICAL EXAMINATION: ECOG PERFORMANCE STATUS: 2 - Symptomatic, <50% confined to bed  Vitals:   05/07/24 1032 05/07/24 1112  BP: (!) 100/58 (!) 120/59  Pulse: (!) 55 60  Resp: 16 17  Temp: 97.6 F (36.4 C) (!) 97.5 F (36.4 C)  SpO2: 99% 100%   Filed Weights   05/05/24 0434 05/06/24 0420 05/07/24 0500  Weight: 267 lb 3.2 oz (121.2 kg) 262 lb 12.6 oz (119.2 kg) 264 lb 12.4 oz (120.1 kg)    GENERAL: alert, no distress and comfortable SKIN: skin color, texture, turgor are normal, no rashes or significant lesions EYES: normal, conjunctiva are pink and non-injected, sclera clear OROPHARYNX: no exudate, no erythema and lips, buccal mucosa, and tongue normal  NECK: supple, thyroid normal size, non-tender, without nodularity LYMPH: no palpable lymphadenopathy in the cervical, axillary or inguinal LUNGS: clear to auscultation and percussion with normal breathing effort HEART: regular rate & rhythm and no murmurs and no lower extremity edema ABDOMEN: abdomen soft, non-tender and normal bowel sounds MUSCULOSKELETAL: + Right foot with dressing dry and intact PSYCH: alert & oriented x 3 with fluent speech NEURO: no focal motor/sensory deficits   All questions were answered. The patient knows  to call the clinic with any problems, questions or concerns.   I personally spent a total of 40 minutes minutes in the care of the patient today including preparing to see the patient, performing a medically appropriate exam/evaluation, counseling and educating,  referring and communicating with other health care professionals, documenting clinical information in the EHR, communicating results, and coordinating care.    Olam PARAS Rouson, NP 05/07/2024 11:24 AM    Labs Reviewed:  Lab Results  Component Value Date   WBC 7.8 05/07/2024   HGB 6.9 (LL) 05/07/2024   HCT 24.2 (L) 05/07/2024   MCV 88.3 05/07/2024   PLT 321 05/07/2024   Recent Labs    08/20/23 0000 12/24/23 0800 02/18/24 1300 03/04/24 1440 03/05/24 0452 04/21/24 1532 04/22/24 1326 04/23/24 0655 05/05/24 0145 05/06/24 0343 05/07/24 0706  NA  --    < > 142   < > 139 141 143   < > 138 140 140  K  --    < > 3.7   < > 3.9 3.9 3.8   < > 4.2 3.8 3.8  CL  --    < > 110   < > 109 110 110   < > 105 106 109  CO2  --    < > 25   < > 21* 23 24   < > 24 22 22   GLUCOSE  --    < > 92   < > 86 89 147*   < > 125* 106* 110*  BUN  --    < > 7*   < > 21 9 9    < > 10 11 10   CREATININE  --    < > 0.87   < > 0.73 0.86 0.81   < > 0.77 0.72 0.88  CALCIUM   --    < > 9.1   < > 8.2* 8.4* 8.7*   < > 8.4* 8.6* 8.5*  GFRNONAA  --    < > >60   < > >60  --  >60   < > >60 >60 >60  GFRAA 98  --   --   --   --   --   --   --   --   --   --   PROT  --    < > 7.0  --  5.5* 6.1 6.2*  --   --   --   --   ALBUMIN  --    < > 3.9  --  2.7*  --  3.8  --   --   --   --   AST  --    < > 10*  --  11* 9* 11*  --   --   --   --   ALT  --    < > <5  --  7 6 5   --   --   --   --   ALKPHOS  --    < > 93  --  61  --  92  --   --   --   --   BILITOT  --    < > 0.3  --  0.7 0.2 <0.2  --   --   --   --    < > = values in this interval not displayed.    Studies Reviewed:   ECHOCARDIOGRAM COMPLETE Result Date: 04/25/2024    ECHOCARDIOGRAM REPORT   Patient Name:   MALAI LADY ISLEY-WANSLEY  Date of Exam: 04/25/2024 Medical Rec #:  [id number]              Height:       64.0 in Accession #:    [id number]             Weight:       238.5 lb Date of Birth:  12/12/1960              BSA:          2.109 m Patient Age:    63 years                BP:           136/63 mmHg Patient Gender: F                      HR:           119 bpm. Exam Location:  Inpatient Procedure: 2D Echo, Cardiac Doppler, Color Doppler and Strain Analysis (Both            Spectral and Color Flow Doppler were utilized during procedure). Indications:    R94.31 Abnormal EKG  History:        Patient has prior history of Echocardiogram examinations, most                 recent 04/14/2023. Signs/Symptoms:Syncope,                 Dizziness/Lightheadedness and Fatigue; Risk Factors:Diabetes,                 Hypertension, Dyslipidemia and Current Smoker. Port.  Sonographer:    Ellouise Mose RDCS Referring Phys: [id number] [facility]  Sonographer Comments: Patient is obese. Image acquisition challenging due to patient body habitus. IMPRESSIONS  1. Left ventricular ejection fraction, by estimation, is 65 to 70%. Left ventricular ejection fraction by 2D MOD biplane is 69.5 %. The left ventricle has normal function. The left ventricle has no regional wall motion abnormalities. There is severe left ventricular hypertrophy. Left ventricular diastolic parameters are consistent with Grade II diastolic dysfunction (pseudonormalization). Elevated left atrial pressure. The average left ventricular global longitudinal strain is -24.3 %. The global longitudinal strain is normal.  2. Right ventricular systolic function is normal. The right ventricular size is moderately enlarged. There is severely elevated pulmonary artery systolic pressure. The estimated right ventricular systolic pressure is 60.2 mmHg.  3. Left atrial size was moderately dilated.  4. Right atrial size was moderately dilated.  5. The mitral valve is normal in structure. Trivial mitral valve regurgitation. No evidence of mitral stenosis.  6. Tricuspid valve regurgitation is mild to moderate.  7. There is a calcified nodule on the left coronary cusp (#32) and mild to moderate AS with PV 3.1 m/sec, MG 19 mmhg and DVI 0.46. There is evidence of  high flow state with LVOT VTI 29 cm. The aortic valve is tricuspid. Aortic valve regurgitation is trivial. Mild to moderate aortic valve stenosis.  8. There is Moderate (Grade III) atheroma plaque involving the aortic arch.  9. There is late systolic flow reversal of hepatic vein suggestive noncompliant RV. The inferior vena cava is dilated in size with <50% respiratory variability, suggesting right atrial pressure of 15 mmHg. Comparison(s): Prior images reviewed side by side. EF is similar to prior. There is now evidence of pulmonary HTN with RVSP 60, dilated RV/RA, and new aortic stenosis. FINDINGS  Left Ventricle: Left ventricular ejection fraction,  by estimation, is 65 to 70%. Left ventricular ejection fraction by 2D MOD biplane is 69.5 %. The left ventricle has normal function. The left ventricle has no regional wall motion abnormalities. The average left ventricular global longitudinal strain is -24.3 %. Strain was performed and the global longitudinal strain is normal. The left ventricular internal cavity size was normal in size. There is severe left ventricular hypertrophy. Left ventricular diastolic parameters are consistent with Grade II diastolic dysfunction (pseudonormalization). Elevated left atrial pressure. Right Ventricle: The right ventricular size is moderately enlarged. No increase in right ventricular wall thickness. Right ventricular systolic function is normal. There is severely elevated pulmonary artery systolic pressure. The tricuspid regurgitant velocity is 3.36 m/s, and with an assumed right atrial pressure of 15 mmHg, the estimated right ventricular systolic pressure is 60.2 mmHg. Left Atrium: Left atrial size was moderately dilated. Right Atrium: Right atrial size was moderately dilated. Pericardium: There is no evidence of pericardial effusion. Mitral Valve: The mitral valve is normal in structure. Trivial mitral valve regurgitation. No evidence of mitral valve stenosis. Tricuspid Valve:  The tricuspid valve is normal in structure. Tricuspid valve regurgitation is mild to moderate. No evidence of tricuspid stenosis. Aortic Valve: There is a calcified nodule on the left coronary cusp (#32) and mild to moderate AS with PV 3.1 m/sec, MG 19 mmhg and DVI 0.46. There is evidence of high flow state with LVOT VTI 29 cm. The aortic valve is tricuspid. Aortic valve regurgitation is trivial. Mild to moderate aortic stenosis is present. Aortic valve mean gradient measures 19.0 mmHg. Aortic valve peak gradient measures 39.4 mmHg. Aortic valve area, by VTI measures 1.45 cm. Pulmonic Valve: The pulmonic valve was normal in structure. Pulmonic valve regurgitation is not visualized. No evidence of pulmonic stenosis. Aorta: The aortic root and ascending aorta are structurally normal, with no evidence of dilitation. There is moderate (Grade III) atheroma plaque involving the aortic arch. Venous: A normal flow pattern is recorded from the right upper pulmonary vein. There is late systolic flow reversal of hepatic vein suggestive noncompliant RV. The inferior vena cava is dilated in size with less than 50% respiratory variability, suggesting right atrial pressure of 15 mmHg. IAS/Shunts: No atrial level shunt detected by color flow Doppler.  LEFT VENTRICLE PLAX 2D                        Biplane EF (MOD) LVIDd:         5.40 cm         LV Biplane EF:   Left LVIDs:         3.00 cm                          ventricular LV PW:         1.30 cm                          ejection LV IVS:        1.10 cm                          fraction by LVOT diam:     2.00 cm                          2D MOD LV SV:  91                               biplane is LV SV Index:   43                               69.5 %. LVOT Area:     3.14 cm LV IVRT:       85 msec         Diastology                                LV e' medial:    10.80 cm/s                                LV E/e' medial:  14.6 LV Volumes (MOD)               LV e' lateral:    11.20 cm/s LV vol d, MOD    150.0 ml      LV E/e' lateral: 14.1 A2C: LV vol d, MOD    147.0 ml      2D Longitudinal A4C:                           Strain LV vol s, MOD    50.4 ml       2D Strain GLS   -26.4 % A2C:                           (A4C): LV vol s, MOD    39.2 ml       2D Strain GLS   -23.8 % A4C:                           (A3C): LV SV MOD A2C:   99.6 ml       2D Strain GLS   -22.7 % LV SV MOD A4C:   147.0 ml      (A2C): LV SV MOD BP:    103.3 ml      2D Strain GLS   -24.3 %                                Avg: RIGHT VENTRICLE             IVC RV S prime:     14.40 cm/s  IVC diam: 2.90 cm TAPSE (M-mode): 2.9 cm                             PULMONARY VEINS                             Diastolic Velocity: 42.50 cm/s                             S/D Velocity:       1.40  Systolic Velocity:  60.00 cm/s LEFT ATRIUM             Index        RIGHT ATRIUM           Index LA diam:        4.00 cm 1.90 cm/m   RA Area:     26.10 cm LA Vol (A2C):   95.1 ml 45.10 ml/m  RA Volume:   85.40 ml  40.50 ml/m LA Vol (A4C):   75.0 ml 35.57 ml/m LA Biplane Vol: 88.9 ml 42.16 ml/m  AORTIC VALVE AV Area (Vmax):    1.31 cm AV Area (Vmean):   1.49 cm AV Area (VTI):     1.45 cm AV Vmax:           314.00 cm/s AV Vmean:          187.750 cm/s AV VTI:            0.631 m AV Peak Grad:      39.4 mmHg AV Mean Grad:      19.0 mmHg LVOT Vmax:         131.00 cm/s LVOT Vmean:        89.300 cm/s LVOT VTI:          0.291 m LVOT/AV VTI ratio: 0.46  AORTA Ao Root diam: 3.30 cm Ao Asc diam:  3.70 cm MITRAL VALVE                TRICUSPID VALVE MV Area (PHT): 2.76 cm     TR Peak grad:   45.2 mmHg MV Decel Time: 275 msec     TR Vmax:        336.00 cm/s MV E velocity: 158.00 cm/s MV A velocity: 123.00 cm/s  SHUNTS MV E/A ratio:  1.28         Systemic VTI:  0.29 m                             Systemic Diam: 2.00 cm Joelle Azobou Tonleu Electronically signed by Joelle Cedars Tonleu Signature Date/Time: 04/25/2024/3:02:27 PM     Final    DG CHEST PORT 1 VIEW Result Date: 04/23/2024 CLINICAL DATA:  Endotracheal intubation EXAM: PORTABLE CHEST 1 VIEW COMPARISON:  April 22, 2024 FINDINGS: Stable cardiomegaly. Endotracheal tube is noted distal tip 1.5 cm above the carina. Right internal jugular Port-A-Cath is unchanged. Right lung is clear. Mild left retrocardiac opacity is noted concerning for possible atelectasis or infiltrate and associated effusion. IMPRESSION: Endotracheal tube is noted with distal tip 1.5 cm above the carina. Mild left retrocardiac opacity is noted concerning for possible atelectasis or infiltrate and associated effusion. Electronically Signed   By: Lynwood Landy Raddle M.D.   On: 04/23/2024 15:32   US  EKG SITE RITE Result Date: 04/23/2024 If Site Rite image not attached, placement could not be confirmed due to current cardiac rhythm.  DG Chest 2 View Result Date: 04/22/2024 CLINICAL DATA:  Shortness of breath EXAM: CHEST - 2 VIEW COMPARISON:  March 04, 2024 FINDINGS: Stable cardiomegaly. Right internal jugular port is unchanged. Both lungs are clear. The visualized skeletal structures are unremarkable. IMPRESSION: No active cardiopulmonary disease. Electronically Signed   By: Lynwood Landy Raddle M.D.   On: 04/22/2024 14:08   Addendum I have seen the patient, examined her. I agree with the assessment and and plan and have edited the notes.   Robby is clinically stable,  still has intermittent epistaxis, but no major bleeding.  Hemoglobin slightly dropped this morning to 6.9, she received 1 unit of blood.  She received 1 dose of IV iron  earlier this week.  She is awaiting to be discharged to rehab.  She is still not sure where to live after discharge from rehab.  She is aware of her appointment with us  in the office next Wednesday.  All questions were answered.  [name] MD 05/07/2024

## 2024-05-07 NOTE — Progress Notes (Signed)
 Physical Therapy Treatment Patient Details Name: Peggy House MRN: [id number] DOB: [birth year] Today's Date: 05/07/2024   History of Present Illness 64 y.o. female admitted 04/22/24 with DOE, fatigue, several nosebleeds, black tarry stools. Workup for anemia, GIB, significant epistaxis with bloody aspiration. ETT 1/9-1/10. PMH includes hereditary hemorrhagic telangiectasia (off VEG-F inhibitor due to healing issues after ankle sx), multiple R ankle sxs with impaired healing (04/2023-07/2023), HTN, DM2, GIB, tobacco use.    PT Comments  Pt seated in recliner, agreeable to in room mobility with encouragement secondary to pain in L knee and R ankle. Needing increased assistance for sit>stand today secondary to pain. Increased gait tolerance to 58ft (recliner>door>recliner) before needing to sit and rest. Continue to require intermittent assistance and VC for balance, posture, and gait mechanics. She will continue to benefit from therapy while admitted to acute hospital; no change to PT discharge recommendations.     If plan is discharge home, recommend the following: A little help with walking and/or transfers;A little help with bathing/dressing/bathroom;Assistance with cooking/housework;Assist for transportation;Help with stairs or ramp for entrance   Can travel by private vehicle        Equipment Recommendations  None recommended by PT    Recommendations for Other Services       Precautions / Restrictions Precautions Precautions: Fall;Other (comment) Recall of Precautions/Restrictions: Intact Precaution/Restrictions Comments: h/o R ankle injury with multiple sxs (wound currently wrapped in gauze), L knee pain with arthritis h/o buckling Restrictions Weight Bearing Restrictions Per Provider Order: No     Mobility  Bed Mobility               General bed mobility comments: Pt in recliner at beginning and end of session    Transfers Overall transfer level: Needs  assistance Equipment used: Rolling walker (2 wheels) Transfers: Sit to/from Stand Sit to Stand: Mod assist, Min assist           General transfer comment: MOD A sit>stand secondary to R ankle and L knee pain with VC for increased anterior weight shift and upright posture, MIN A for sit>stand VC for hand placement to assist with controlled lowering to chair    Ambulation/Gait Ambulation/Gait assistance: Contact guard assist, Min assist Gait Distance (Feet): 40 Feet (to door and back from recliner) Assistive device: Rolling walker (2 wheels)   Gait velocity: decreased     General Gait Details: initially requires MIN A for balance with first couple of steps, balance improves with time. Increased R toe out gait pattern, pt reports this is due to increased swelling in knees and need to compensate for space. Improve step height bilaterally, no sliding of feet to take steps. Pt improved activity tolerace. HR 70 and spO2 99% on RA after ambulation   Stairs             Wheelchair Mobility     Tilt Bed    Modified Rankin (Stroke Patients Only)       Balance Overall balance assessment: Needs assistance Sitting-balance support: No upper extremity supported, Feet supported Sitting balance-Leahy Scale: Good Sitting balance - Comments: seated in recliner without back support   Standing balance support: Bilateral upper extremity supported, During functional activity, Reliant on assistive device for balance Standing balance-Leahy Scale: Poor Standing balance comment: reliant on RW support, VC for posture due to forward trunk lean and increased hip flexion causing mild posterior LOB with initial standing  Communication Communication Communication: No apparent difficulties  Cognition Arousal: Alert Behavior During Therapy: WFL for tasks assessed/performed   PT - Cognitive impairments: No apparent impairments                          Following commands: Intact      Cueing Cueing Techniques: Verbal cues  Exercises      General Comments        Pertinent Vitals/Pain Pain Assessment Pain Assessment: 0-10 Pain Score: 7  Pain Location: R ankle, L knee Pain Descriptors / Indicators: Discomfort, Grimacing Pain Intervention(s): Limited activity within patient's tolerance, Monitored during session, Repositioned    Home Living                          Prior Function            PT Goals (current goals can now be found in the care plan section) Acute Rehab PT Goals Patient Stated Goal: to return home PT Goal Formulation: With patient Time For Goal Achievement: 05/09/24 Potential to Achieve Goals: Good Progress towards PT goals: Progressing toward goals    Frequency    Min 2X/week      PT Plan      Co-evaluation              AM-PAC PT 6 Clicks Mobility   Outcome Measure  Help needed turning from your back to your side while in a flat bed without using bedrails?: A Little Help needed moving from lying on your back to sitting on the side of a flat bed without using bedrails?: A Little Help needed moving to and from a bed to a chair (including a wheelchair)?: A Little Help needed standing up from a chair using your arms (e.g., wheelchair or bedside chair)?: A Lot Help needed to walk in hospital room?: A Little Help needed climbing 3-5 steps with a railing? : Total 6 Click Score: 15    End of Session Equipment Utilized During Treatment: Gait belt Activity Tolerance: Patient tolerated treatment well;Patient limited by fatigue;Patient limited by pain;No increased pain Patient left: with call bell/phone within reach;in chair;with chair alarm set Nurse Communication: Mobility status PT Visit Diagnosis: Other abnormalities of gait and mobility (R26.89);Muscle weakness (generalized) (M62.81);Pain Pain - Right/Left: Right (L knee d/t arthritis) Pain - part of body: Ankle and joints of  foot     Time: 8545-8481 PT Time Calculation (min) (ACUTE ONLY): 24 min  Charges:    $Gait Training: 8-22 mins $Therapeutic Activity: 8-22 mins                       Isaiah DEL. Laxmi Choung, PT, DPT   [employer] 05/07/2024, 3:27 PM

## 2024-05-08 DIAGNOSIS — D649 Anemia, unspecified: Secondary | ICD-10-CM | POA: Diagnosis not present

## 2024-05-08 LAB — TYPE AND SCREEN
ABO/RH(D): A NEG
Antibody Screen: NEGATIVE
Unit division: 0

## 2024-05-08 LAB — BPAM RBC
Blood Product Expiration Date: 202602122359
ISSUE DATE / TIME: 202601231037
Unit Type and Rh: 600

## 2024-05-08 LAB — COMPREHENSIVE METABOLIC PANEL WITH GFR
ALT: <5 U/L (ref 0–44)
AST: 12 U/L — ABNORMAL LOW (ref 15–41)
Albumin: 3.2 g/dL — ABNORMAL LOW (ref 3.5–5.0)
Alkaline Phosphatase: 84 U/L (ref 38–126)
Anion gap: 7 (ref 5–15)
BUN: 12 mg/dL (ref 8–23)
CO2: 23 mmol/L (ref 22–32)
Calcium: 8.5 mg/dL — ABNORMAL LOW (ref 8.9–10.3)
Chloride: 111 mmol/L (ref 98–111)
Creatinine, Ser: 0.83 mg/dL (ref 0.44–1.00)
GFR, Estimated: >60 mL/min
Glucose, Bld: 113 mg/dL — ABNORMAL HIGH (ref 70–99)
Potassium: 3.9 mmol/L (ref 3.5–5.1)
Sodium: 141 mmol/L (ref 135–145)
Total Bilirubin: 0.2 mg/dL (ref 0.0–1.2)
Total Protein: 6.1 g/dL — ABNORMAL LOW (ref 6.5–8.1)

## 2024-05-08 LAB — CBC
HCT: 24.8 % — ABNORMAL LOW (ref 36.0–46.0)
Hemoglobin: 7.5 g/dL — ABNORMAL LOW (ref 12.0–15.0)
MCH: 26.3 pg (ref 26.0–34.0)
MCHC: 30.2 g/dL (ref 30.0–36.0)
MCV: 87 fL (ref 80.0–100.0)
Platelets: 315 10*3/uL (ref 150–400)
RBC: 2.85 MIL/uL — ABNORMAL LOW (ref 3.87–5.11)
RDW: 18 % — ABNORMAL HIGH (ref 11.5–15.5)
WBC: 8.6 10*3/uL (ref 4.0–10.5)
nRBC: 0 % (ref 0.0–0.2)

## 2024-05-08 LAB — GLUCOSE, CAPILLARY: Glucose-Capillary: 106 mg/dL — ABNORMAL HIGH (ref 70–99)

## 2024-05-08 MED ORDER — SODIUM CHLORIDE 0.9 % IV SOLN
125.0000 mg | Freq: Once | INTRAVENOUS | Status: AC
  Administered 2024-05-08: 125 mg via INTRAVENOUS
  Filled 2024-05-08: qty 10

## 2024-05-08 NOTE — Progress Notes (Addendum)
 Active nose bleed; following prn orders; MD and RN aware. Continue to monitor.

## 2024-05-08 NOTE — Plan of Care (Signed)
 Has bleeding from nose at around 1250. Charge nurse notified to MD. [place] were monitored. Afrin soaked gauze is placed under nose. Afterwards, bleeding got stopped from the nose.  Problem: Education: Goal: Knowledge of General Education information will improve Description: Including pain rating scale, medication(s)/side effects and non-pharmacologic comfort measures Outcome: Progressing   Problem: Health Behavior/Discharge Planning: Goal: Ability to manage health-related needs will improve Outcome: Progressing   Problem: Clinical Measurements: Goal: Ability to maintain clinical measurements within normal limits will improve Outcome: Progressing   Problem: Clinical Measurements: Goal: Will remain free from infection Outcome: Progressing   Problem: Clinical Measurements: Goal: Diagnostic test results will improve Outcome: Progressing   Problem: Activity: Goal: Risk for activity intolerance will decrease Outcome: Progressing   Problem: Nutrition: Goal: Adequate nutrition will be maintained Outcome: Progressing   Problem: Elimination: Goal: Will not experience complications related to bowel motility Outcome: Progressing   Problem: Elimination: Goal: Will not experience complications related to urinary retention Outcome: Progressing   Problem: Pain Managment: Goal: General experience of comfort will improve and/or be controlled Outcome: Progressing   Problem: Safety: Goal: Ability to remain free from injury will improve Outcome: Progressing   Problem: Skin Integrity: Goal: Risk for impaired skin integrity will decrease Outcome: Progressing   Problem: Activity: Goal: Ability to tolerate increased activity will improve Outcome: Progressing

## 2024-05-08 NOTE — TOC Progression Note (Signed)
 Transition of Care [facility]) - Progression Note    Patient Details  Name: VICTOIRE DEANS MRN: [id number] Date of Birth: [birth year]  [facility] CM/SW Contact  Gwenn Frieze [place], [place] Phone Number: 05/08/2024, 11:15 AM  Clinical Narrative:  Per Leontine with [facility], auth remains pending at this time.   Frieze Gwenn, MSW, LCSW [phone] (coverage)       Expected Discharge Plan: Skilled Nursing Facility Barriers to Discharge: Continued Medical Work up, English As A Second Language Teacher               Expected Discharge Plan and Services   Discharge Planning Services: CM Consult   Living arrangements for the past 2 months: Single Family Home Expected Discharge Date: 05/05/24                                     Social Drivers of Health (SDOH) Interventions SDOH Screenings   Food Insecurity: No Food Insecurity (04/22/2024)  Housing: Low Risk (04/22/2024)  Transportation Needs: No Transportation Needs (04/22/2024)  Utilities: Not At Risk (04/22/2024)  Depression (PHQ2-9): Low Risk (03/20/2024)  Tobacco Use: High Risk (04/23/2024)    Readmission Risk Interventions    04/18/2023    3:07 PM 04/15/2023    2:22 PM  Readmission Risk Prevention Plan  Transportation Screening Complete Complete  PCP or Specialist Appt within 5-7 Days  Complete  PCP or Specialist Appt within 3-5 Days Complete   Home Care Screening  Complete  Medication Review (RN CM)  Complete  HRI or Home Care Consult Complete   Social Work Consult for Recovery Care Planning/Counseling Complete   Palliative Care Screening Complete   Medication Review Oceanographer) Complete

## 2024-05-08 NOTE — Progress Notes (Signed)
" °  Progress Note   Patient: Peggy House FMW:[id number] DOB: [birth year] DOA: 04/22/2024     16 DOS: the patient was seen and examined on 05/08/2024 at 9:48 AM      Brief hospital course: 64 y.o. F with hereditary telangiectasias, hx AVMs, iron  deficiency anemia, DM, obesity and chronic osteomyelitis of the right fibula who presented with weakness, nosebleed and melena. Admitted and found to have Hgb 4.1.      Assessment and Plan: Melena and GI bleeding due to AVMs Anemia No further GI bleeding - Continue PPI twice daily - Continue Carafate       Severe epistaxis due to HHT Acute on chronic blood loss anemia Iron  deficiency anemia Transfused 1/23, hemoglobin up appropriately today  If further bleeding: Avoid manipulation of the nose Apply Afrin spray If ineffective, apply Afrin to gauze and pack lightly If ineffective, apply TXA to gauze and apply lightly If ineffective, would require Nasopore per ENT  - Repeat IV iron  pending (gets Monoferric  1000 mg q14 days normally).  Last dose here on 1/16 and 1/14 (got 375 mg ferrlecit  at that time) --> discussed with pharmacy - Outpatient follow-up - Continue Amicar        Hereditary hemorrhagic telangiectasias, POA: - Follow-up with hematology     Essential hypertension, POA: Blood pressure controlled - Continue amlodipine  and lisinopril     Chronic OM of right tib/fibula status post removal of hardware and I&D, POA:  -Continue vancomycin  - Needs to reschedule ID follow-up   Chronic pain Mood disorder - Continue oxycodone , gabapentin , Xanax , trazodone   Class 3 obesity        Subjective: Doing well.  Small nose bleed mid day today.     Physical Exam: BP (!) 119/59 (BP Location: Left Arm)   Pulse 63   Temp 98 F (36.7 C) (Oral)   Resp 16   Ht 5' 4 (1.626 m)   Wt 121.1 kg   SpO2 100%   BMI 45.83 kg/m   Obese adult female, sitting up in bed, interactive and appropriate RRR, SEM noted, no  peripheral edema Respiratory rate normal, lungs clear without rales or wheezes Abdomen soft, no tenderness palpation or guarding, no ascites or distention The right ankle appears normal, wrapped in gauze Attention normal, affect appropriate, judgment and insight appear normal    Data Reviewed: CBC shows Hgb up appropriately. BMP unremarkable       Disposition: Status is: Inpatient         Author: Lonni SHAUNNA Dalton, MD 05/08/2024 2:40 PM  For on call review [email or web address]    "

## 2024-05-09 DIAGNOSIS — D649 Anemia, unspecified: Secondary | ICD-10-CM | POA: Diagnosis not present

## 2024-05-09 LAB — COMPREHENSIVE METABOLIC PANEL WITH GFR
ALT: <5 U/L (ref 0–44)
AST: 17 U/L (ref 15–41)
Albumin: 3.4 g/dL — ABNORMAL LOW (ref 3.5–5.0)
Alkaline Phosphatase: 86 U/L (ref 38–126)
Anion gap: 9 (ref 5–15)
BUN: 12 mg/dL (ref 8–23)
CO2: 23 mmol/L (ref 22–32)
Calcium: 8.5 mg/dL — ABNORMAL LOW (ref 8.9–10.3)
Chloride: 111 mmol/L (ref 98–111)
Creatinine, Ser: 0.84 mg/dL (ref 0.44–1.00)
GFR, Estimated: >60 mL/min
Glucose, Bld: 111 mg/dL — ABNORMAL HIGH (ref 70–99)
Potassium: 3.9 mmol/L (ref 3.5–5.1)
Sodium: 143 mmol/L (ref 135–145)
Total Bilirubin: <0.2 mg/dL (ref 0.0–1.2)
Total Protein: 6.5 g/dL (ref 6.5–8.1)

## 2024-05-09 LAB — GLUCOSE, CAPILLARY: Glucose-Capillary: 105 mg/dL — ABNORMAL HIGH (ref 70–99)

## 2024-05-09 LAB — CBC
HCT: 25.1 % — ABNORMAL LOW (ref 36.0–46.0)
Hemoglobin: 7.5 g/dL — ABNORMAL LOW (ref 12.0–15.0)
MCH: 26 pg (ref 26.0–34.0)
MCHC: 29.9 g/dL — ABNORMAL LOW (ref 30.0–36.0)
MCV: 87.2 fL (ref 80.0–100.0)
Platelets: 345 10*3/uL (ref 150–400)
RBC: 2.88 MIL/uL — ABNORMAL LOW (ref 3.87–5.11)
RDW: 18.4 % — ABNORMAL HIGH (ref 11.5–15.5)
WBC: 8.3 10*3/uL (ref 4.0–10.5)
nRBC: 0 % (ref 0.0–0.2)

## 2024-05-09 MED ORDER — OXYMETAZOLINE HCL 0.05 % NA SOLN
1.0000 | Freq: Two times a day (BID) | NASAL | Status: AC | PRN
  Administered 2024-05-09 – 2024-05-11 (×2): 1 via NASAL
  Filled 2024-05-09 (×2): qty 30

## 2024-05-09 NOTE — Progress Notes (Addendum)
" °  Progress Note   Patient: Peggy House FMW:[id number] DOB: 1960/05/08 DOA: 04/22/2024     17 DOS: the patient was seen and examined on 05/09/2024 at 9:48 AM      Brief hospital course: 64 y.o. F with hereditary telangiectasias, hx AVMs, iron  deficiency anemia, DM, obesity and chronic osteomyelitis of the right fibula who presented with weakness, nosebleed and melena. Admitted and found to have Hgb 4.1.      Assessment and Plan: Melena and GI bleeding due to AVMs Anemia No further GI bleeding - Continue PPI twice daily - Continue Carafate       Severe epistaxis due to HHT Acute on chronic blood loss anemia Iron  deficiency anemia Transfused 1/23, hemoglobin up appropriately after and remains stable  If further bleeding: Avoid manipulation of the nose Apply Afrin spray If ineffective, apply Afrin to gauze and pack lightly If ineffective, apply TXA to gauze and apply lightly If ineffective, would require Nasopore per ENT  - Repeat IV iron  pending (gets Monoferric  1000 mg q14 days normally).  Last dose here on 1/16 and 1/14 (got 375 mg ferrlecit  at that time) --> discussed with pharmacy - Outpatient follow-up - Continue Amicar    Retained sutures Removed 4 sutures today.  It appears 3 remain that I can't safely get. - Consult Ortho   Hereditary hemorrhagic telangiectasias, POA: - Follow-up with hematology     Essential hypertension, POA: Blood pressure controlled - Continue amlodipine  and lisinopril     Chronic OM of right tib/fibula status post removal of hardware and I&D, POA:  -Continue vancomycin  - Needs to reschedule ID follow-up   Chronic pain Mood disorder - Continue oxycodone , gabapentin , Xanax , trazodone   Class 3 obesity        Subjective: No epistaxis today.  Ankle hurts.  Appears fine.     Physical Exam: BP 125/63 (BP Location: Left Arm)   Pulse (!) 55   Temp 98.1 F (36.7 C) (Oral)   Resp 19   Ht 5' 4 (1.626 m)   Wt 118.2 kg    SpO2 100%   BMI 44.73 kg/m   Obese adult female, sitting up in bed, interactive and appropriate RRR, SEM noted, no peripheral edema Respiratory rate normal, lungs clear without rales or wheezes Abdomen soft, no tenderness palpation or guarding, no ascites or distention Wound appears good.  See images Attention normal, affect appropriate, judgment and insight appear normal    Data Reviewed: CMP normal CBC stable       Disposition: Status is: Inpatient         Author: Lonni SHAUNNA Dalton, MD 05/09/2024 2:29 PM  For on call review [email or web address]    "

## 2024-05-10 ENCOUNTER — Inpatient Hospital Stay: Payer: Self-pay | Admitting: Student

## 2024-05-10 DIAGNOSIS — D649 Anemia, unspecified: Secondary | ICD-10-CM | POA: Diagnosis not present

## 2024-05-10 LAB — COMPREHENSIVE METABOLIC PANEL WITH GFR
ALT: <5 U/L (ref 0–44)
AST: 20 U/L (ref 15–41)
Albumin: 3.4 g/dL — ABNORMAL LOW (ref 3.5–5.0)
Alkaline Phosphatase: 84 U/L (ref 38–126)
Anion gap: 8 (ref 5–15)
BUN: 11 mg/dL (ref 8–23)
CO2: 24 mmol/L (ref 22–32)
Calcium: 8.4 mg/dL — ABNORMAL LOW (ref 8.9–10.3)
Chloride: 111 mmol/L (ref 98–111)
Creatinine, Ser: 0.77 mg/dL (ref 0.44–1.00)
GFR, Estimated: >60 mL/min
Glucose, Bld: 108 mg/dL — ABNORMAL HIGH (ref 70–99)
Potassium: 4 mmol/L (ref 3.5–5.1)
Sodium: 143 mmol/L (ref 135–145)
Total Bilirubin: <0.2 mg/dL (ref 0.0–1.2)
Total Protein: 6.4 g/dL — ABNORMAL LOW (ref 6.5–8.1)

## 2024-05-10 LAB — CBC
HCT: 25.7 % — ABNORMAL LOW (ref 36.0–46.0)
Hemoglobin: 7.5 g/dL — ABNORMAL LOW (ref 12.0–15.0)
MCH: 25.4 pg — ABNORMAL LOW (ref 26.0–34.0)
MCHC: 29.2 g/dL — ABNORMAL LOW (ref 30.0–36.0)
MCV: 87.1 fL (ref 80.0–100.0)
Platelets: 317 10*3/uL (ref 150–400)
RBC: 2.95 MIL/uL — ABNORMAL LOW (ref 3.87–5.11)
RDW: 18.4 % — ABNORMAL HIGH (ref 11.5–15.5)
WBC: 8.5 10*3/uL (ref 4.0–10.5)
nRBC: 0 % (ref 0.0–0.2)

## 2024-05-10 LAB — GLUCOSE, CAPILLARY: Glucose-Capillary: 102 mg/dL — ABNORMAL HIGH (ref 70–99)

## 2024-05-10 MED ORDER — SODIUM CHLORIDE 0.9 % IV SOLN
250.0000 mg | Freq: Once | INTRAVENOUS | Status: AC
  Administered 2024-05-11: 250 mg via INTRAVENOUS
  Filled 2024-05-10: qty 20

## 2024-05-10 NOTE — Progress Notes (Signed)
 Peggy House   DOB:1960-12-20   FM#:[id number]   RDW#:[id number]  Hematology follow-up  Subjective: Patient has intermittent epistaxis, received 1 unit of blood transfusion a few days ago, and last dose ferric gluconate 125 mg was given yesterday.  No active bleeding when I saw her.  She is awaiting for SNF placement.   Objective:  Vitals:   05/10/24 0800 05/10/24 1200  BP: 133/67 129/61  Pulse: 61 (!) 57  Resp:    Temp: 98.8 F (37.1 C) 98.4 F (36.9 C)  SpO2: 100%     Body mass index is 44.73 kg/m.  Intake/Output Summary (Last 24 hours) at 05/10/2024 1359 Last data filed at 05/10/2024 1020 Gross per 24 hour  Intake 263 ml  Output 1000 ml  Net -737 ml     Sclerae unicteric  Oropharynx clear  No peripheral adenopathy  Lungs clear -- no rales or rhonchi  Heart regular rate and rhythm  Abdomen benign  MSK no focal spinal tenderness, right lower extremity above ankle is wrapped with gauze  Neuro nonfocal    CBG (last 3)  Recent Labs    05/08/24 0633 05/09/24 0613 05/10/24 0724  GLUCAP 106* 105* 102*     Labs:  Lab Results  Component Value Date   WBC 8.5 05/10/2024   HGB 7.5 (L) 05/10/2024   HCT 25.7 (L) 05/10/2024   MCV 87.1 05/10/2024   PLT 317 05/10/2024   NEUTROABS 8.1 (H) 05/05/2024    Urine Studies No results for input(s): UHGB, CRYS in the last 72 hours.  Invalid input(s): UACOL, UAPR, USPG, UPH, UTP, UGL, UKET, UBIL, UNIT, UROB, [place], UEPI, UWBC, CORINN JERROL BURNS Peggy House, Peggy House  Basic Metabolic Panel: Recent Labs  Lab 05/06/24 0343 05/07/24 0706 05/08/24 0700 05/09/24 0605 05/10/24 0558  NA 140 140 141 143 143  K 3.8 3.8 3.9 3.9 4.0  CL 106 109 111 111 111  CO2 22 22 23 23 24   GLUCOSE 106* 110* 113* 111* 108*  BUN 11 10 12 12 11   CREATININE 0.72 0.88 0.83 0.84 0.77  CALCIUM  8.6* 8.5* 8.5* 8.5* 8.4*   GFR Estimated Creatinine Clearance: 91 mL/min (by C-G formula based on SCr of 0.77  mg/dL). Liver Function Tests: Recent Labs  Lab 05/08/24 0700 05/09/24 0605 05/10/24 0558  AST 12* 17 20  ALT <5 <5 <5  ALKPHOS 84 86 84  BILITOT 0.2 <0.2 <0.2  PROT 6.1* 6.5 6.4*  ALBUMIN 3.2* 3.4* 3.4*   No results for input(s): LIPASE, AMYLASE in the last 168 hours. No results for input(s): AMMONIA in the last 168 hours. Coagulation profile No results for input(s): INR, PROTIME in the last 168 hours.  CBC: Recent Labs  Lab 05/05/24 1032 05/06/24 0343 05/06/24 2213 05/07/24 0706 05/08/24 0700 05/09/24 0605 05/10/24 0558  WBC 9.7 8.7  --  7.8 8.6 8.3 8.5  NEUTROABS 8.1*  --   --   --   --   --   --   HGB 7.4* 7.3* 7.0* 6.9* 7.5* 7.5* 7.5*  HCT 25.3* 24.4* 24.1* 24.2* 24.8* 25.1* 25.7*  MCV 89.4 88.7  --  88.3 87.0 87.2 87.1  PLT 389 345  --  321 315 345 317   Cardiac Enzymes: No results for input(s): CKTOTAL, CKMB, CKMBINDEX, TROPONINI in the last 168 hours. BNP: Invalid input(s): POCBNP CBG: Recent Labs  Lab 05/06/24 1558 05/07/24 0628 05/08/24 0633 05/09/24 0613 05/10/24 0724  GLUCAP 112* 103* 106* 105* 102*   D-Dimer No results for input(s):  DDIMER in the last 72 hours. Hgb A1c No results for input(s): HGBA1C in the last 72 hours. Lipid Profile No results for input(s): CHOL, HDL, LDLCALC, TRIG, CHOLHDL, LDLDIRECT in the last 72 hours. Thyroid function studies No results for input(s): TSH, T4TOTAL, T3FREE, THYROIDAB in the last 72 hours.  Invalid input(s): FREET3 Anemia work up No results for input(s): VITAMINB12, FOLATE, FERRITIN, TIBC, IRON , RETICCTPCT in the last 72 hours. Microbiology No results found for this or any previous visit (from the past 240 hours).    Studies:  No results found.  Assessment: 64 y.o. female   Acute on chronic anemia secondary to epistaxis and GI bleeding HHT Hypertension Chronic osteomyelitis of right tibia Chronic pain Obesity   Plan:  - We  restarted the bevacizumab  for her HHT related bleeding during the hospital admission, she is due for next dose this Wednesday, I will arrange to be given in the hospital tomorrow. - I recommend 1 more dose ferric gluconate to 250 mg in the next few days - Okay to discharge to SNF from my standpoint.  She will need a lab weekly, possible blood transfusion and IV iron  every 1 to 2 weeks. - I can arrange for her IV iron , blood transfusion and next bevacizumab  in my office.  Please inform me when she is ready to be discharged.   Peggy Mattock, MD 05/10/2024  1:59 PM

## 2024-05-10 NOTE — Plan of Care (Addendum)
 Appears calm. No family on bed side. Tolerated dressing change to right ankle well. Had one episode of nasal bleeding from left nostril, Afrin and gauze applied. No more issue.  Problem: Health Behavior/Discharge Planning: Goal: Ability to manage health-related needs will improve Outcome: Progressing   Problem: Education: Goal: Knowledge of General Education information will improve Description: Including pain rating scale, medication(s)/side effects and non-pharmacologic comfort measures Outcome: Progressing   Problem: Activity: Goal: Risk for activity intolerance will decrease Outcome: Progressing   Problem: Activity: Goal: Ability to tolerate increased activity will improve Outcome: Progressing   Problem: Pain Managment: Goal: General experience of comfort will improve and/or be controlled Outcome: Progressing

## 2024-05-10 NOTE — Progress Notes (Signed)
 Physical Therapy Treatment Patient Details Name: Peggy House MRN: [id number] DOB: 1960-09-12 Today's Date: 05/10/2024   History of Present Illness 64 y.o. female admitted 04/22/24 with DOE, fatigue, several nosebleeds, black tarry stools. Workup for anemia, GIB, significant epistaxis with bloody aspiration. ETT 1/9-1/10. PMH includes hereditary hemorrhagic telangiectasia (off VEG-F inhibitor due to healing issues after ankle sx), multiple R ankle sxs with impaired healing (04/2023-07/2023), HTN, DM2, GIB, tobacco use.    PT Comments  Patient able to perform bed mobility with bed features, incr time and effort and CGA for safety (due to near need for assist). Transfers with min assist with RW and had one uncontrolled stand to sit (landing seated in chair) as stepping back to the chair during transfer. Session limited by pt not on telemetry portable monitor and lunch arrived just prior to PT arrival. She did agree to get up to chair for lunch. Patient will benefit from continued inpatient follow up therapy, <3 hours/day     If plan is discharge home, recommend the following: A little help with walking and/or transfers;A little help with bathing/dressing/bathroom;Assistance with cooking/housework;Assist for transportation;Help with stairs or ramp for entrance   Can travel by private vehicle        Equipment Recommendations  None recommended by PT    Recommendations for Other Services       Precautions / Restrictions Precautions Precautions: Fall;Other (comment) Recall of Precautions/Restrictions: Intact Precaution/Restrictions Comments: h/o R ankle injury with multiple sxs (wound currently wrapped in gauze), L knee pain with arthritis h/o buckling Restrictions Weight Bearing Restrictions Per Provider Order: No     Mobility  Bed Mobility Overal bed mobility: Needs Assistance Bed Mobility: Supine to Sit     Supine to sit: Contact guard, HOB elevated     General bed mobility  comments: incr time and effort, but no use of rail    Transfers Overall transfer level: Needs assistance Equipment used: Rolling walker (2 wheels) Transfers: Sit to/from Stand, Bed to chair/wheelchair/BSC Sit to Stand: Min assist   Step pivot transfers: Min assist       General transfer comment: pt with no reports of pain; stood from EOB and due to telemetry and lunch arriving, only pivoted to chair; as pt stepping backwards to chair she lost her balance posteriorly with a sudden, uncontrolled stand to sit on the chair. Repeated sit to stand from recliner with controlled descent to chair    Ambulation/Gait               General Gait Details: deferred due to no tele box and pt's lunch arrived. She agreed to get up to the chair for lunch   Stairs             Wheelchair Mobility     Tilt Bed    Modified Rankin (Stroke Patients Only)       Balance Overall balance assessment: Needs assistance Sitting-balance support: No upper extremity supported, Feet unsupported Sitting balance-Leahy Scale: Good     Standing balance support: Bilateral upper extremity supported, During functional activity, Reliant on assistive device for balance Standing balance-Leahy Scale: Poor                              Communication Communication Communication: No apparent difficulties  Cognition Arousal: Alert Behavior During Therapy: WFL for tasks assessed/performed   PT - Cognitive impairments: No apparent impairments  Following commands: Intact      Cueing Cueing Techniques: Verbal cues  Exercises      General Comments        Pertinent Vitals/Pain Pain Assessment Pain Assessment: Faces Faces Pain Scale: Hurts a little bit Pain Location: R ankle, L knee Pain Descriptors / Indicators: Discomfort Pain Intervention(s): Limited activity within patient's tolerance, Monitored during session    Home Living                           Prior Function            PT Goals (current goals can now be found in the care plan section) Acute Rehab PT Goals Patient Stated Goal: to return home PT Goal Formulation: With patient Time For Goal Achievement: 05/24/24 Potential to Achieve Goals: Good Progress towards PT goals: Goals updated    Frequency    Min 2X/week      PT Plan      Co-evaluation              AM-PAC PT 6 Clicks Mobility   Outcome Measure  Help needed turning from your back to your side while in a flat bed without using bedrails?: A Little Help needed moving from lying on your back to sitting on the side of a flat bed without using bedrails?: A Little Help needed moving to and from a bed to a chair (including a wheelchair)?: A Little Help needed standing up from a chair using your arms (e.g., wheelchair or bedside chair)?: A Little Help needed to walk in hospital room?: A Little Help needed climbing 3-5 steps with a railing? : Total 6 Click Score: 16    End of Session Equipment Utilized During Treatment: Gait belt Activity Tolerance: Patient tolerated treatment well;No increased pain Patient left: with call bell/phone within reach;in chair Nurse Communication: Mobility status;Other (comment) (pt asked to remove purewick) PT Visit Diagnosis: Other abnormalities of gait and mobility (R26.89);Muscle weakness (generalized) (M62.81);Pain     Time: 8850-8794 PT Time Calculation (min) (ACUTE ONLY): 16 min  Charges:    $Gait Training: 8-22 mins PT General Charges $$ ACUTE PT VISIT: 1 Visit                      Macario RAMAN, PT Acute Rehabilitation Services  Office [phone]    Macario SHAUNNA Soja 05/10/2024, 12:27 PM

## 2024-05-10 NOTE — Progress Notes (Signed)
" °  Progress Note   Patient: Peggy House FMW:[id number] DOB: 1960-06-26 DOA: 04/22/2024     18 DOS: the patient was seen and examined on 05/10/2024 at 9:50AM      Brief hospital course: 64 y.o. F with hereditary telangiectasias, hx AVMs, iron  deficiency anemia, DM, obesity and chronic osteomyelitis of the right fibula who presented with weakness, nosebleed and melena. Admitted and found to have Hgb 4.1.      Assessment and Plan: Melena and GI bleeding due to AVMs Anemia - Continue carafate  and PPI      Severe epistaxis due to HHT Acute on chronic blood loss anemia Iron  deficiency anemia See prior summary - IV iron  again - Consult Oncology, appreciate expertise - Continue Amicar    Retained sutures Removed 4 sutures today.  It appears 3 remain that I can't safely get. - Her ortho unavailable today, will call again tomorrow   Hereditary hemorrhagic telangiectasias, POA: - See above     Essential hypertension, POA: BP normal - Continue amlodipine  and lisinopril     Chronic OM of right tib/fibula status post removal of hardware and I&D, POA:  -Continue vancomycin  - Needs to reschedule ID follow-up   Chronic pain Mood disorder - Continue oxycodone , gabapentin , Xanax , trazodone   Class 3 obesity        Subjective: No clinical change, no nursing concerns.  No epistaxis.     Physical Exam: BP 129/61 (BP Location: Left Arm)   Pulse (!) 57   Temp 98.4 F (36.9 C) (Oral)   Resp 14   Ht 5' 4 (1.626 m)   Wt 118.2 kg   SpO2 100%   BMI 44.73 kg/m   Obese adult female, sitting up in bed, interactive and appropriate RRR, SEM noted, no peripheral edema Respiratory rate normal, lungs clear without rales or wheezes Abdomen soft, no tenderness palpation or guarding, no ascites or distention Wound appears good.  See images Attention normal, affect appropriate, judgment and insight appear normal    Data Reviewed: CMP normal CBC  stable       Disposition: Status is: Inpatient         Author: Lonni SHAUNNA Dalton, MD 05/10/2024 5:20 PM  For on call review [email or web address]    "

## 2024-05-11 DIAGNOSIS — D649 Anemia, unspecified: Secondary | ICD-10-CM | POA: Diagnosis not present

## 2024-05-11 LAB — CBC
HCT: 25.3 % — ABNORMAL LOW (ref 36.0–46.0)
Hemoglobin: 7.6 g/dL — ABNORMAL LOW (ref 12.0–15.0)
MCH: 26.1 pg (ref 26.0–34.0)
MCHC: 30 g/dL (ref 30.0–36.0)
MCV: 86.9 fL (ref 80.0–100.0)
Platelets: 328 10*3/uL (ref 150–400)
RBC: 2.91 MIL/uL — ABNORMAL LOW (ref 3.87–5.11)
RDW: 18.7 % — ABNORMAL HIGH (ref 11.5–15.5)
WBC: 8.5 10*3/uL (ref 4.0–10.5)
nRBC: 0 % (ref 0.0–0.2)

## 2024-05-11 LAB — BASIC METABOLIC PANEL WITH GFR
Anion gap: 9 (ref 5–15)
BUN: 10 mg/dL (ref 8–23)
CO2: 23 mmol/L (ref 22–32)
Calcium: 8.5 mg/dL — ABNORMAL LOW (ref 8.9–10.3)
Chloride: 109 mmol/L (ref 98–111)
Creatinine, Ser: 0.74 mg/dL (ref 0.44–1.00)
GFR, Estimated: >60 mL/min
Glucose, Bld: 130 mg/dL — ABNORMAL HIGH (ref 70–99)
Potassium: 3.9 mmol/L (ref 3.5–5.1)
Sodium: 141 mmol/L (ref 135–145)

## 2024-05-11 LAB — GLUCOSE, CAPILLARY: Glucose-Capillary: 106 mg/dL — ABNORMAL HIGH (ref 70–99)

## 2024-05-11 LAB — VANCOMYCIN, PEAK: Vancomycin Pk: 32 ug/mL (ref 30–40)

## 2024-05-11 LAB — VANCOMYCIN, RANDOM: Vancomycin Rm: 20 ug/mL

## 2024-05-11 MED ORDER — VANCOMYCIN HCL 1750 MG/350ML IV SOLN
1750.0000 mg | INTRAVENOUS | Status: DC
  Administered 2024-05-11 – 2024-05-12 (×2): 1750 mg via INTRAVENOUS
  Filled 2024-05-11 (×3): qty 350

## 2024-05-11 MED ORDER — SODIUM CHLORIDE 0.9 % IV SOLN
5.0000 mg/kg | Freq: Once | INTRAVENOUS | Status: AC
  Administered 2024-05-11: 500 mg via INTRAVENOUS
  Filled 2024-05-11: qty 4

## 2024-05-11 MED ORDER — SODIUM CHLORIDE 0.9 % IV SOLN: Freq: Once | INTRAVENOUS | Status: DC

## 2024-05-11 NOTE — Progress Notes (Signed)
 " Progress Note   Patient: Peggy House FMW:[id number] DOB: August 12, 1960 DOA: 04/22/2024     19 DOS: the patient was seen and examined on 05/11/2024 at 10:45AM      Brief hospital course: 64 y.o. F with hereditary hemorrhagic telangiectasia (HHT), hx AVMs, iron  deficiency anemia, DM, obesity and chronic osteomyelitis of the right fibula who presented with weakness, nosebleed and melena. Admitted and found to have Hgb 4.1.   Hospitalization prolonged by complications from HHT.   Assessment and Plan: Melena and GI bleeding due to AVMs Anemia Admitted and GI consulted.  Underwent EGD on hospital day two showing >10 AVMs, the largest of which were treated with APC, procedure complicated by severe epistaxis at end of procedure requiring intubation.  Follow up VCE showed scattered AVMs, but confirmed highest burden in stomach. - Continue carafate  and PPI - Outpatient follow up with GI    HHT Acute on chronic anemia due to chronic blood loss Iron  deficiency anemia Follows with Dr. Lanny for chronic iron  defiency anemia due to her chronic blood loss from HHT.  Typically has about 15 epistaxis per month in typical month.  On q2 weeks IV iron  schedule with Dr. Lanny, last got IV iron  1/27.  Also on outpatient Avastin , which is due to infusion again tomorrow - Continue Amicar  - Appreciate hematology expertise - Avastin  tomorrow per Heme   Severe epistaxis due to HHT ENT Dr. Llewellyn consulted this admission.  Clots evacuated and Nasopore used.  As above, has typically 15 epistaxis a month.  In last week, these are mostly self-resolving or resolve with Afrin. - Afrin for nosebleed - If Afrin unsuccessful, can apply TXA to gauze and lightly apply/pack - If TXA unsuccessful, avoid hard packing, consult ENT for Nasopore - At discharge, SNF should be aware of above frequency of epistaxis and instructions for Afrin PRN to be available at bedside -- nosebleed lasting >1hr should be evaluated  in ER    Osteomyelitis, acute on chronic, s/p hardware removal and I&D last Dec Had saucerization and closure of wound for Tib/fib osteo with Dr. Elsa last month 03/16/24.  Missed follow up appointments.  ID consulted this admission, recommended 6 weeks IV vancomycin  via Port.  OPAT orders in place - Continue vancomycin  - Outpatient follow up with ID    Retained sutures Patient missed ortho follow up in December, still has retained sutures.    4 sutures removed by me on 1/26, several remain that I can't get.I discussed with Dr. Elsa, he will evaluate in hospital. - Appreciate orthopedics assistance     Essential hypertension BP controlled - Continue amlodipine  and lisinopril     Chronic pain Mood disorder - Continue oxycodone , gabapentin , Xanax , trazodone   Class 3 obesity        Subjective: Small nosebleed today.  No dizziness, no confusion.  Got IV iron  today.     Physical Exam: BP 132/60 (BP Location: Left Arm)   Pulse (!) 56   Temp 98.7 F (37.1 C) (Oral)   Resp 18   Ht 5' 4 (1.626 m)   Wt 117.9 kg   SpO2 99%   BMI 44.63 kg/m   Obese adult female, sitting up in bed, interactive and appropriate RRR, SEM noted, no peripheral edema Respiratory rate normal, lungs clear without rales or wheezes Abdomen soft, no tenderness palpation or guarding, no ascites or distention Wound appears good.  See images Attention normal, affect appropriate, judgment and insight appear normal    Data Reviewed: Basic metabolic  panel normal CBC shows stable severe anemia       Disposition: Status is: Inpatient 64 y.o. F with HHT here with melena.  Hospitalization prolonged by HHT complications but these now appear stable for discharge to SNF when bed available will need multiple close follow ups.        Author: Lonni SHAUNNA Dalton, MD 05/11/2024 7:12 PM  For on call review [email or web address]    "

## 2024-05-11 NOTE — Plan of Care (Addendum)
 Pain complained of severe pain at her Right ankle twice so prn pain medicine was given. Dressing done. Sitting up in the chair and doing her incentive spirometer.   Problem: Education: Goal: Knowledge of General Education information will improve Description: Including pain rating scale, medication(s)/side effects and non-pharmacologic comfort measures Outcome: Progressing   Problem: Health Behavior/Discharge Planning: Goal: Ability to manage health-related needs will improve Outcome: Progressing   Problem: Clinical Measurements: Goal: Will remain free from infection Outcome: Progressing   Problem: Activity: Goal: Risk for activity intolerance will decrease Outcome: Progressing   Problem: Nutrition: Goal: Adequate nutrition will be maintained Outcome: Progressing   Problem: Coping: Goal: Level of anxiety will decrease Outcome: Progressing   Problem: Safety: Goal: Ability to remain free from injury will improve Outcome: Progressing   Problem: Skin Integrity: Goal: Risk for impaired skin integrity will decrease Outcome: Progressing   Problem: Activity: Goal: Ability to tolerate increased activity will improve Outcome: Progressing   Problem: Role Relationship: Goal: Method of communication will improve Outcome: Progressing

## 2024-05-11 NOTE — Plan of Care (Signed)
 Pt is alert and fully oriented x 4, afebrile, stable hemodynamically, on room air, normal respiratory effort, no acute distress noted. She has minimal amount blood from epistaxis. No active bleeding. Right ankle wound dressing changed q shift. Pain is well tolerated. Pt is able to rest and sleep well with no major complaints. Plan of care is reviewed.   Problem: Clinical Measurements: Goal: Ability to maintain clinical measurements within normal limits will improve Outcome: Progressing Goal: Will remain free from infection Outcome: Progressing Goal: Diagnostic test results will improve Outcome: Progressing Goal: Respiratory complications will improve Outcome: Progressing Goal: Cardiovascular complication will be avoided Outcome: Progressing   Problem: Pain Managment: Goal: General experience of comfort will improve and/or be controlled Outcome: Progressing   Problem: Nutrition: Goal: Adequate nutrition will be maintained Outcome: Progressing   Problem: Skin Integrity: Goal: Risk for impaired skin integrity will decrease Outcome: Progressing   Wendi Dash, RN

## 2024-05-11 NOTE — Progress Notes (Signed)
 PHARMACY CONSULT NOTE FOR:  OUTPATIENT  PARENTERAL ANTIBIOTIC THERAPY (OPAT)  Indication: Osteomyelitis Regimen: Vancomycin  IV 1750 mg every 24 hours - dose updated 1/27 (adjusted as needed based on renal function and vancomycin  levels with goal AUC 400-600) End date: 06/04/2024  IV antibiotic discharge orders are pended. To discharging provider:  please sign these orders via discharge navigator,  Select New Orders & click on the button choice - Manage This Unsigned Work.    Sergio Batch, PharmD, BCIDP, AAHIVP, CPP Infectious Disease Pharmacist 05/11/2024 2:56 PM

## 2024-05-11 NOTE — Progress Notes (Signed)
 Occupational Therapy Treatment Patient Details Name: Peggy House MRN: [id number] DOB: 1960/04/17 Today's Date: 05/11/2024   History of present illness 64 y.o. female admitted 04/22/24 with DOE, fatigue, several nosebleeds, black tarry stools. Workup for anemia, GIB, significant epistaxis with bloody aspiration. ETT 1/9-1/10. PMH includes hereditary hemorrhagic telangiectasia (off VEG-F inhibitor due to healing issues after ankle sx), multiple R ankle sxs with impaired healing (04/2023-07/2023), HTN, DM2, GIB, tobacco use.   OT comments  This 64 yo female seen today to work on toilet transfers and ambulation in her room. She needed increased A today for up to EOB (over yesterday), then able with min A to step turn to [facility] with total A for back peri care. She was able to ambulate with CGA and RW around to other side of the bed to sit in the recliner. She will continue to benefit from acute OT with follow from continued inpatient follow up therapy, <3 hours/day to work here way towards her PLOF of being Mod I.       If plan is discharge home, recommend the following:  A little help with walking and/or transfers;A lot of help with bathing/dressing/bathroom;Assistance with cooking/housework;Assist for transportation;Help with stairs or ramp for entrance   Equipment Recommendations  None recommended by OT       Precautions / Restrictions Precautions Precautions: Fall Recall of Precautions/Restrictions: Intact Precaution/Restrictions Comments: h/o R ankle injury with multiple sxs (wound currently wrapped in gauze), L knee pain with arthritis h/o buckling Restrictions Weight Bearing Restrictions Per Provider Order: No       Mobility Bed Mobility Overal bed mobility: Needs Assistance Bed Mobility: Supine to Sit     Supine to sit: Mod assist, HOB elevated, Used rails     General bed mobility comments: came up to sit fine, but then needed A to scoot forward to EOB     Transfers Overall transfer level: Needs assistance Equipment used: 1 person hand held assist Transfers: Sit to/from Stand Sit to Stand: Min assist     Step pivot transfers: Min assist           Balance Overall balance assessment: Needs assistance Sitting-balance support: No upper extremity supported, Feet supported Sitting balance-Leahy Scale: Good     Standing balance support: Single extremity supported Standing balance-Leahy Scale: Poor                             ADL either performed or assessed with clinical judgement   ADL Overall ADL's : Needs assistance/impaired                         Toilet Transfer: Minimal assistance;Stand-pivot;BSC/3in1   Toileting- Clothing Manipulation and Hygiene: Total assistance Toileting - Clothing Manipulation Details (indicate cue type and reason): for back peri care; min A sit<>stand            Extremity/Trunk Assessment Upper Extremity Assessment Upper Extremity Assessment: Generalized weakness            Vision Patient Visual Report: No change from baseline           Communication Communication Communication: No apparent difficulties   Cognition Arousal: Alert Behavior During Therapy: WFL for tasks assessed/performed                                 Following commands: Intact  Cueing   Cueing Techniques: Verbal cues             Pertinent Vitals/ Pain       Pain Assessment Pain Assessment: 0-10 Pain Score: 4  Pain Location: R ankle Pain Descriptors / Indicators: Discomfort Pain Intervention(s): Limited activity within patient's tolerance, Monitored during session, Repositioned         Frequency  Min 2X/week        Progress Toward Goals  OT Goals(current goals can now be found in the care plan section)  Progress towards OT goals: Progressing toward goals  Acute Rehab OT Goals Patient Stated Goal: to go to rehab OT Goal Formulation: With  patient Time For Goal Achievement: 05/25/24 Potential to Achieve Goals: Good         AM-PAC OT 6 Clicks Daily Activity     Outcome Measure   Help from another person eating meals?: None Help from another person taking care of personal grooming?: A Little Help from another person toileting, which includes using toliet, bedpan, or urinal?: A Lot Help from another person bathing (including washing, rinsing, drying)?: A Lot Help from another person to put on and taking off regular upper body clothing?: A Little Help from another person to put on and taking off regular lower body clothing?: A Lot 6 Click Score: 16    End of Session Equipment Utilized During Treatment: Rolling walker (2 wheels)  OT Visit Diagnosis: Unsteadiness on feet (R26.81);Other abnormalities of gait and mobility (R26.89);Muscle weakness (generalized) (M62.81);Pain Pain - Right/Left: Right Pain - part of body: Ankle and joints of foot   Activity Tolerance Patient tolerated treatment well   Patient Left in chair;with call bell/phone within reach;with chair alarm set           Time: 8543-8467 OT Time Calculation (min): 36 min  Charges: OT General Charges $OT Visit: 1 Visit OT Treatments $Self Care/Home Management : 23-37 mins  Donny BECKER OT Acute Rehabilitation Services Office [phone]   Rodgers Dorothyann Distel 05/11/2024, 4:29 PM

## 2024-05-11 NOTE — Progress Notes (Signed)
 Pharmacy Antibiotic Note  Peggy House is a 64 y.o. female admitted on 04/22/2024 with a chief complaint of generalized weakness/fatigue/dizziness, nosebleed, and episode of black stool.Pharmacy has been consulted for vancomycin  dosing. Infectious workup is for osteomyelitis. Plan for 6 weeks IV vancomycin  - stop date 2/20.  Vanc peak 32, Vanc Random 20>>Calculated AUC 575. AUC on the higher side, though alternative recommended dose would be 1750 mg IV Q24. Scr remains stable.   Plan: Change Vancomycin  to 1750mg  IV daily   Monitor renal function Consider levels at Css on new dose   Height: 5' 4 (162.6 cm) Weight: 117.9 kg (260 lb) IBW/kg (Calculated) : 54.7  Temp (24hrs), Avg:98.6 F (37 C), Min:98.1 F (36.7 C), Max:99.2 F (37.3 C)  Recent Labs  Lab 05/07/24 0706 05/08/24 0700 05/09/24 0605 05/10/24 0558 05/11/24 0230 05/11/24 1230  WBC 7.8 8.6 8.3 8.5 8.5  --   CREATININE 0.88 0.83 0.84 0.77 0.74  --   VANCOPEAK  --   --   --   --  32  --   VANCORANDOM  --   --   --   --   --  20    Estimated Creatinine Clearance: 90.9 mL/min (by C-G formula based on SCr of 0.74 mg/dL).    Allergies[1]  Antimicrobials this admission: Linezolid  1/8>>1/9 Vancomycin  1/9 >> [06/04/2024 per ID]  Microbiology results: 03/16/24 surgical culture from right leg wound = Corynebacterium striatum  Sergio Batch, PharmD, [place], AAHIVP, CPP Infectious Disease Pharmacist 05/11/2024 2:53 PM           [1]  Allergies Allergen Reactions   Feraheme  [Ferumoxytol ] Other (See Comments)    Muscle tetany, encephalopathy, fatigue, nausea (reaction to injection)   Nsaids Other (See Comments)    Stomach bleeding episodes; Tylenol  is OK   Wasp Venom Anaphylaxis   Tomato Hives   Iron  (Ferrous Sulfate ) [Ferrous Sulfate  Er] Other (See Comments)    Shock

## 2024-05-11 NOTE — Progress Notes (Signed)
 Ok to proceed with CBC w/diff results from 1/21 and w/o urine protein per Dr. Lanny.  Amaris Delafuente, PharmD, MBA

## 2024-05-11 NOTE — TOC Progression Note (Signed)
 Transition of Care [facility]) - Progression Note    Patient Details  Name: JAHNAVI MURATORE MRN: [id number] Date of Birth: 31-Jan-1961  Transition of Care [facility] CM/SW Contact  Almarie CHRISTELLA [place]ie, [place] Phone Number: 05/11/2024, 10:11 AM  Clinical Narrative:   CSW spoke with Admissions at [facility] They have not heard an update on authorization, will reach out today to ask for update. CSW to follow.    Expected Discharge Plan: Skilled Nursing Facility Barriers to Discharge: Continued Medical Work up, English As A Second Language Teacher               Expected Discharge Plan and Services   Discharge Planning Services: CM Consult   Living arrangements for the past 2 months: Single Family Home Expected Discharge Date: 05/05/24                                     Social Drivers of Health (SDOH) Interventions SDOH Screenings   Food Insecurity: No Food Insecurity (04/22/2024)  Housing: Low Risk (04/22/2024)  Transportation Needs: No Transportation Needs (04/22/2024)  Utilities: Not At Risk (04/22/2024)  Depression (PHQ2-9): Low Risk (03/20/2024)  Tobacco Use: High Risk (04/23/2024)    Readmission Risk Interventions    04/18/2023    3:07 PM 04/15/2023    2:22 PM  Readmission Risk Prevention Plan  Transportation Screening Complete Complete  PCP or Specialist Appt within 5-7 Days  Complete  PCP or Specialist Appt within 3-5 Days Complete   Home Care Screening  Complete  Medication Review (RN CM)  Complete  HRI or Home Care Consult Complete   Social Work Consult for Recovery Care Planning/Counseling Complete   Palliative Care Screening Complete   Medication Review Oceanographer) Complete

## 2024-05-11 NOTE — Progress Notes (Signed)
 Chemo RN presented to patient room. Patient verified using two separate identifiers. Role of Chemo RN explained to patient. Consent for administration of bevacizumab  previously obtained this admission. Patient tolerated infusion well, without issue. Primary RN aware.

## 2024-05-12 ENCOUNTER — Inpatient Hospital Stay

## 2024-05-12 ENCOUNTER — Inpatient Hospital Stay: Admitting: Hematology

## 2024-05-12 DIAGNOSIS — D649 Anemia, unspecified: Secondary | ICD-10-CM | POA: Diagnosis not present

## 2024-05-12 LAB — COMPREHENSIVE METABOLIC PANEL WITH GFR
ALT: <5 U/L (ref 0–44)
AST: 14 U/L — ABNORMAL LOW (ref 15–41)
Albumin: 3.4 g/dL — ABNORMAL LOW (ref 3.5–5.0)
Alkaline Phosphatase: 84 U/L (ref 38–126)
Anion gap: 8 (ref 5–15)
BUN: 10 mg/dL (ref 8–23)
CO2: 24 mmol/L (ref 22–32)
Calcium: 8.5 mg/dL — ABNORMAL LOW (ref 8.9–10.3)
Chloride: 110 mmol/L (ref 98–111)
Creatinine, Ser: 0.82 mg/dL (ref 0.44–1.00)
GFR, Estimated: >60 mL/min
Glucose, Bld: 104 mg/dL — ABNORMAL HIGH (ref 70–99)
Potassium: 3.8 mmol/L (ref 3.5–5.1)
Sodium: 141 mmol/L (ref 135–145)
Total Bilirubin: 0.2 mg/dL (ref 0.0–1.2)
Total Protein: 6.1 g/dL — ABNORMAL LOW (ref 6.5–8.1)

## 2024-05-12 LAB — CBC
HCT: 25.5 % — ABNORMAL LOW (ref 36.0–46.0)
Hemoglobin: 7.5 g/dL — ABNORMAL LOW (ref 12.0–15.0)
MCH: 25.4 pg — ABNORMAL LOW (ref 26.0–34.0)
MCHC: 29.4 g/dL — ABNORMAL LOW (ref 30.0–36.0)
MCV: 86.4 fL (ref 80.0–100.0)
Platelets: 297 10*3/uL (ref 150–400)
RBC: 2.95 MIL/uL — ABNORMAL LOW (ref 3.87–5.11)
RDW: 18.7 % — ABNORMAL HIGH (ref 11.5–15.5)
WBC: 7.5 10*3/uL (ref 4.0–10.5)
nRBC: 0.3 % — ABNORMAL HIGH (ref 0.0–0.2)

## 2024-05-12 MED ORDER — OXYCODONE-ACETAMINOPHEN 5-325 MG PO TABS
2.0000 | ORAL_TABLET | ORAL | Status: DC | PRN
  Administered 2024-05-12 – 2024-05-13 (×4): 2 via ORAL
  Filled 2024-05-12 (×4): qty 2

## 2024-05-12 MED ORDER — ALPRAZOLAM 0.5 MG PO TABS: 0.5000 mg | ORAL_TABLET | Freq: Every evening | ORAL | 0 refills | Status: AC | PRN

## 2024-05-12 MED ORDER — METHOCARBAMOL 500 MG PO TABS: 500.0000 mg | ORAL_TABLET | Freq: Three times a day (TID) | ORAL | Status: AC

## 2024-05-12 MED ORDER — NICOTINE 14 MG/24HR TD PT24: 14.0000 mg | MEDICATED_PATCH | Freq: Every day | TRANSDERMAL | Status: AC

## 2024-05-12 MED ORDER — GABAPENTIN 300 MG PO CAPS
300.0000 mg | ORAL_CAPSULE | Freq: Every day | ORAL | Status: DC
  Administered 2024-05-12: 300 mg via ORAL
  Filled 2024-05-12: qty 1

## 2024-05-12 MED ORDER — OXYCODONE-ACETAMINOPHEN 5-325 MG PO TABS: 2.0000 | ORAL_TABLET | ORAL | 0 refills | Status: AC | PRN

## 2024-05-12 MED ORDER — GABAPENTIN 300 MG PO CAPS: 300.0000 mg | ORAL_CAPSULE | Freq: Every day | ORAL | 0 refills | Status: AC

## 2024-05-12 MED ORDER — VANCOMYCIN IV (FOR PTA / DISCHARGE USE ONLY): 1750.0000 mg | INTRAVENOUS | 0 refills | Status: AC

## 2024-05-12 MED ORDER — METHOCARBAMOL 500 MG PO TABS
500.0000 mg | ORAL_TABLET | Freq: Three times a day (TID) | ORAL | Status: DC
  Administered 2024-05-12 – 2024-05-13 (×4): 500 mg via ORAL
  Filled 2024-05-12 (×4): qty 1

## 2024-05-12 NOTE — Plan of Care (Signed)
  Problem: Education: Goal: Knowledge of General Education information will improve Description: Including pain rating scale, medication(s)/side effects and non-pharmacologic comfort measures Outcome: Progressing   Problem: Health Behavior/Discharge Planning: Goal: Ability to manage health-related needs will improve Outcome: Progressing   Problem: Clinical Measurements: Goal: Ability to maintain clinical measurements within normal limits will improve Outcome: Progressing Goal: Will remain free from infection Outcome: Progressing Goal: Diagnostic test results will improve Outcome: Progressing Goal: Respiratory complications will improve Outcome: Progressing Goal: Cardiovascular complication will be avoided Outcome: Progressing   Problem: Activity: Goal: Risk for activity intolerance will decrease Outcome: Progressing   Problem: Nutrition: Goal: Adequate nutrition will be maintained Outcome: Progressing   Problem: Coping: Goal: Level of anxiety will decrease Outcome: Progressing   Problem: Elimination: Goal: Will not experience complications related to bowel motility Outcome: Progressing Goal: Will not experience complications related to urinary retention Outcome: Progressing   Problem: Pain Managment: Goal: General experience of comfort will improve and/or be controlled Outcome: Progressing   Problem: Safety: Goal: Ability to remain free from injury will improve Outcome: Progressing   Problem: Skin Integrity: Goal: Risk for impaired skin integrity will decrease Outcome: Progressing   Problem: Activity: Goal: Ability to tolerate increased activity will improve Outcome: Progressing   Problem: Respiratory: Goal: Ability to maintain a clear airway and adequate ventilation will improve Outcome: Progressing   Problem: Role Relationship: Goal: Method of communication will improve Outcome: Progressing

## 2024-05-12 NOTE — Progress Notes (Signed)
 " Progress Note   Patient: Peggy House FMW:[id number] DOB: April 05, 1961 DOA: 04/22/2024     20   Brief hospital course: 64 y.o. F with hereditary hemorrhagic telangiectasia (HHT), hx AVMs, iron  deficiency anemia, DM, obesity and chronic osteomyelitis of the right fibula who presented with weakness, nosebleed and melena. Admitted and found to have Hgb 4.1.  Hospitalization prolonged by complications from HHT.  Assessment and Plan: Melena and GI bleeding due to AVMs Patient is seen by gastroenterology.   Underwent EGD showing >10 AVMs, the largest of which were treated with APC, procedure complicated by severe epistaxis at end of procedure requiring intubation.  Follow up VCE showed scattered AVMs, but confirmed highest burden in stomach. Hemoglobin has been stable. Continue PPI and sucralfate .   Hereditary hemorrhagic telangiectasia (HHT) Acute on chronic blood loss anemia Iron  deficiency anemia Follows with Dr. Lanny for chronic iron  defiency anemia due to her chronic blood loss from HHT.  Typically has about 15 epistaxis per month in typical month. On q2 weeks IV iron  schedule with Dr. Lanny, last got IV iron  1/27.   Also on outpatient Avastin , received dose on 1/27. - Continue aminocaproic  acid - Appreciate hematology expertise  Severe epistaxis due to HHT ENT Dr. Llewellyn consulted this admission.  Clots evacuated and Nasopore used.   As above, has typically 15 epistaxis a month.   In last week, these are mostly self-resolving or resolve with Afrin. - Afrin for nosebleed - If Afrin unsuccessful, can apply TXA to gauze and lightly apply/pack - If TXA unsuccessful, avoid hard packing, consult ENT for Nasopore - At discharge, SNF should be aware of above frequency of epistaxis and instructions for Afrin PRN to be available at bedside -- nosebleed lasting >1hr should be evaluated in ER  Osteomyelitis, acute on chronic, s/p hardware removal and I&D last Dec Had saucerization and  closure of wound for Tib/fib osteo with Dr. Elsa last month 03/16/24.  Missed follow up appointments. ID consulted this admission, recommended 6 weeks IV vancomycin  via Port.  OPAT orders in place - Continue vancomycin  - Outpatient follow up with ID   Retained sutures Patient missed ortho follow up in December, still has retained sutures.  4 sutures were removed by Dr. Jonel on 1/26.  Orthopedics was contacted by him.   Patient mentioned that she was seen by Dr. Sherida yesterday who removed more sutures.  No note in the chart.  Outpatient follow-up.   Essential hypertension BP controlled Continue amlodipine  and lisinopril     Chronic pain Mood disorder - Continue oxycodone , gabapentin , Xanax , trazodone .  Patient mentioned that his pain is not adequately controlled.  She is requesting IV pain medications.  She was educated on the need to slowly wean her off of IV pain medications.  She is noted to be on oxycodone  7.5/325.  Will increase to 10 mg.  Will add muscle relaxants.  Class 3 obesity Estimated body mass index is 44.05 kg/m as calculated from the following:   Height as of this encounter: 5' 4 (1.626 m).   Weight as of this encounter: 116.4 kg.   DVT prophylaxis: SCDs only.  No heparin  products due to high risk of bleeding. CODE STATUS: Full code Family communication: Discussed with patient.  No family at bedside. Disposition: SNF when bed is available.  Subjective: 2 episodes of nosebleed overnight which resolved with Afrin and brief nasal packing.  Complains of poorly controlled pain in the ankle.   Objective: BP 136/67 (BP Location: Left Arm)  Pulse (!) 58   Temp 98.1 F (36.7 C) (Oral)   Resp 18   Ht 5' 4 (1.626 m)   Wt 116.4 kg   SpO2 97%   BMI 44.05 kg/m    General appearance: Awake alert.  In no distress Resp: Clear to auscultation bilaterally.  Normal effort Cardio: S1-S2 is normal regular.  No S3-S4.  No rubs murmurs or bruit GI: Abdomen is soft.   Nontender nondistended.  Bowel sounds are present normal.  No masses organomegaly Extremities: Dressing noted over the right ankle. No obvious focal neurological deficits.   Data Reviewed:    Latest Ref Rng & Units 05/12/2024    5:00 AM 05/11/2024    2:30 AM 05/10/2024    5:58 AM  CBC  WBC 4.0 - 10.5 K/uL 7.5  8.5  8.5   Hemoglobin 12.0 - 15.0 g/dL 7.5  7.6  7.5   Hematocrit 36.0 - 46.0 % 25.5  25.3  25.7   Platelets 150 - 400 K/uL 297  328  317        Latest Ref Rng & Units 05/12/2024    5:00 AM 05/11/2024    2:30 AM 05/10/2024    5:58 AM  BMP  Glucose 70 - 99 mg/dL 895  869  891   BUN 8 - 23 mg/dL 10  10  11    Creatinine 0.44 - 1.00 mg/dL 9.17  9.25  9.22   Sodium 135 - 145 mmol/L 141  141  143   Potassium 3.5 - 5.1 mmol/L 3.8  3.9  4.0   Chloride 98 - 111 mmol/L 110  109  111   CO2 22 - 32 mmol/L 24  23  24    Calcium  8.9 - 10.3 mg/dL 8.5  8.5  8.4      Joette Pebbles, MD 05/12/2024 9:36 AM  For on call review [email or web address]   "

## 2024-05-12 NOTE — Progress Notes (Signed)
 Patient had 2 nosebleeds overnight. Patient did use afrin nasal spray and packing with the nosebleeds. Patient left packing in place about 10 minutes per episode. Will continue with current plan of care.

## 2024-05-12 NOTE — Progress Notes (Signed)
 Physical Therapy Treatment Patient Details Name: Peggy House MRN: [id number] DOB: [birth year] Today's Date: 05/12/2024   History of Present Illness 64 y.o. female admitted 04/22/24 with DOE, fatigue, several nosebleeds, black tarry stools. Workup for anemia, GIB, significant epistaxis with bloody aspiration. ETT 1/9-1/10. PMH includes hereditary hemorrhagic telangiectasia (off VEG-F inhibitor due to healing issues after ankle sx), multiple R ankle sxs with impaired healing (04/2023-07/2023), HTN, DM2, GIB, tobacco use.    PT Comments  Patient sleepy, however agrees to work with PT. Focused on increasing independence with mobility and muscular endurance. Bed mobility requires min assist with use of bed features (HOB elevated and rail). Mostly assisted with scooting hips forward to EOB once pt was sitting upright. Sit to stand pt attempted with bed at lowest height without success. Raised bed ~2  and pt required heavy min assist to come to stand. Able to ambulate 50 ft with RW and slow, shuffling gait. Patient very pleased with her progress and agreed to sitting up in chair.     If plan is discharge home, recommend the following: A little help with walking and/or transfers;A little help with bathing/dressing/bathroom;Assistance with cooking/housework;Assist for transportation;Help with stairs or ramp for entrance   Can travel by private vehicle     Yes  Equipment Recommendations  None recommended by PT    Recommendations for Other Services       Precautions / Restrictions Precautions Precautions: Fall Recall of Precautions/Restrictions: Intact Precaution/Restrictions Comments: h/o R ankle injury with multiple sxs (wound currently wrapped in gauze), L knee pain with arthritis h/o buckling Restrictions Weight Bearing Restrictions Per Provider Order: No     Mobility  Bed Mobility Overal bed mobility: Needs Assistance Bed Mobility: Supine to Sit     Supine to sit: HOB elevated,  Min assist     General bed mobility comments: came up to sit fine, but then needed A to scoot forward to EOB    Transfers Overall transfer level: Needs assistance Equipment used: Rolling walker (2 wheels) Transfers: Sit to/from Stand Sit to Stand: Min assist, From elevated surface           General transfer comment: stood from EOB (elevated)    Ambulation/Gait Ambulation/Gait assistance: Contact guard assist, Min assist Gait Distance (Feet): 50 Feet Assistive device: Rolling walker (2 wheels) Gait Pattern/deviations: Step-to pattern, Decreased stance time - right, Decreased stride length, Decreased weight shift to right, Shuffle, Decreased step length - left, Wide base of support Gait velocity: decreased Gait velocity interpretation: <1.8 ft/sec, indicate of risk for recurrent falls   General Gait Details: pt eager to walk; slow, shuffling gait   Stairs             Wheelchair Mobility     Tilt Bed    Modified Rankin (Stroke Patients Only)       Balance Overall balance assessment: Needs assistance Sitting-balance support: No upper extremity supported, Feet supported Sitting balance-Leahy Scale: Good Sitting balance - Comments: seated in recliner without back support   Standing balance support: Single extremity supported Standing balance-Leahy Scale: Poor Standing balance comment: reliant on RW support, VC for posture due to forward trunk lean and increased hip flexion causing mild posterior LOB with initial standing                            Communication Communication Communication: No apparent difficulties  Cognition Arousal: Alert Behavior During Therapy: WFL for tasks assessed/performed   PT -  Cognitive impairments: No apparent impairments                       PT - Cognition Comments: pt with slight drowsiness when not directly engaged but able to answer questions appropriately Following commands: Intact      Cueing  Cueing Techniques: Verbal cues  Exercises      General Comments        Pertinent Vitals/Pain Pain Assessment Pain Assessment: 0-10 Pain Score: 5  Pain Location: R ankle Pain Descriptors / Indicators: Discomfort Pain Intervention(s): Limited activity within patient's tolerance, Monitored during session    Home Living                          Prior Function            PT Goals (current goals can now be found in the care plan section) Acute Rehab PT Goals Patient Stated Goal: to return home PT Goal Formulation: With patient Time For Goal Achievement: 05/24/24 Potential to Achieve Goals: Good Progress towards PT goals: Progressing toward goals    Frequency    Min 2X/week      PT Plan      Co-evaluation              AM-PAC PT 6 Clicks Mobility   Outcome Measure  Help needed turning from your back to your side while in a flat bed without using bedrails?: A Little Help needed moving from lying on your back to sitting on the side of a flat bed without using bedrails?: A Little Help needed moving to and from a bed to a chair (including a wheelchair)?: A Little Help needed standing up from a chair using your arms (e.g., wheelchair or bedside chair)?: A Little Help needed to walk in hospital room?: A Little Help needed climbing 3-5 steps with a railing? : Total 6 Click Score: 16    End of Session Equipment Utilized During Treatment: Gait belt Activity Tolerance: Patient tolerated treatment well;No increased pain Patient left: with call bell/phone within reach;in chair;with chair alarm set Nurse Communication: Mobility status PT Visit Diagnosis: Other abnormalities of gait and mobility (R26.89);Muscle weakness (generalized) (M62.81);Pain Pain - Right/Left: Right (L knee d/t arthritis) Pain - part of body: Ankle and joints of foot     Time: 8961-8941 PT Time Calculation (min) (ACUTE ONLY): 20 min  Charges:    $Gait Training: 8-22 mins PT  General Charges $$ ACUTE PT VISIT: 1 Visit                      Macario RAMAN, PT Acute Rehabilitation Services  Office [phone]    Macario SHAUNNA Soja 05/12/2024, 11:10 AM

## 2024-05-13 ENCOUNTER — Ambulatory Visit: Admitting: Student

## 2024-05-13 DIAGNOSIS — D649 Anemia, unspecified: Secondary | ICD-10-CM | POA: Diagnosis not present

## 2024-05-13 LAB — GLUCOSE, CAPILLARY: Glucose-Capillary: 124 mg/dL — ABNORMAL HIGH (ref 70–99)

## 2024-05-13 NOTE — TOC Transition Note (Signed)
 Transition of Care [facility]) - Discharge Note   Patient Details  Name: Peggy House MRN: [id number] Date of Birth: 1960-05-30  Transition of Care [facility]) CM/SW Contact:  Almarie CHRISTELLA Goodie, LCSW Phone Number: 05/13/2024, 1:06 PM   Clinical Narrative:   CSW spoke with Admissions at [facility], patient's insurance authorization is still pending. [facility] willing to accept a 5 day LOG pending authorization for patient to admit today. CSW completed paperwork for LOG. CSW updated MD, discharge summary sent to [facility] CSW updated patient, she is in agreement. Patient initially reported having a friend to provide transport but they are unavailable today. Transport arranged with [facility] for next available.  Nurse to call report to [phone], Room 211A.    Final next level of care: Skilled Nursing Facility Barriers to Discharge: Barriers Resolved   Patient Goals and CMS Choice Patient states their goals for this hospitalization and ongoing recovery are:: to go home CMS Medicare.gov Compare Post Acute Care list provided to:: Patient Choice offered to / list presented to : Patient      Discharge Placement              Patient [facility] bed at: [facility] Patient to be transferred to facility by: [facility] Name of family member notified: Self Patient and family notified of of transfer: 05/13/24  Discharge Plan and Services Additional resources added to the After Visit Summary for     Discharge Planning Services: CM Consult                                 Social Drivers of Health (SDOH) Interventions SDOH Screenings   Food Insecurity: No Food Insecurity (04/22/2024)  Housing: Low Risk (04/22/2024)  Transportation Needs: No Transportation Needs (04/22/2024)  Utilities: Not At Risk (04/22/2024)  Depression (PHQ2-9): Low Risk (03/20/2024)  Tobacco Use: High Risk (04/23/2024)     Readmission Risk Interventions    04/18/2023    3:07 PM 04/15/2023     2:22 PM  Readmission Risk Prevention Plan  Transportation Screening Complete Complete  PCP or Specialist Appt within 5-7 Days  Complete  PCP or Specialist Appt within 3-5 Days Complete   Home Care Screening  Complete  Medication Review (RN CM)  Complete  HRI or Home Care Consult Complete   Social Work Consult for Recovery Care Planning/Counseling Complete   Palliative Care Screening Complete   Medication Review Oceanographer) Complete

## 2024-05-13 NOTE — Progress Notes (Signed)
 Patient is going to [facility]. Report given to [place], [facility].

## 2024-05-13 NOTE — Progress Notes (Signed)
 " Progress Note   Patient: Peggy House FMW:[id number] DOB: 18-Jul-1960 DOA: 04/22/2024     21   Brief hospital course: 64 y.o. F with hereditary hemorrhagic telangiectasia (HHT), hx AVMs, iron  deficiency anemia, DM, obesity and chronic osteomyelitis of the right fibula who presented with weakness, nosebleed and melena. Admitted and found to have Hgb 4.1.  Hospitalization prolonged by complications from HHT.  Assessment and Plan: Melena and GI bleeding due to AVMs Patient is seen by gastroenterology.   Underwent EGD showing >10 AVMs, the largest of which were treated with APC, procedure complicated by severe epistaxis at end of procedure requiring intubation.  Follow up VCE showed scattered AVMs, but confirmed highest burden in stomach. Hemoglobin has been stable.  Checking every other day. Continue PPI and sucralfate .   Hereditary hemorrhagic telangiectasia (HHT) Acute on chronic blood loss anemia Iron  deficiency anemia Follows with Dr. Lanny for chronic iron  defiency anemia due to her chronic blood loss from HHT.  Typically has about 15 epistaxis per month in typical month. On q2 weeks IV iron  schedule with Dr. Lanny, last got IV iron  1/27.   Also on outpatient Avastin , received dose on 1/27. Continue aminocaproic  acid  Severe epistaxis due to HHT ENT Dr. Llewellyn consulted this admission.  Clots evacuated and Nasopore used.   As above, has typically 15 epistaxis a month.   In last week, these are mostly self-resolving or resolve with Afrin. - Afrin for nosebleed - If Afrin unsuccessful, can apply TXA to gauze and lightly apply/pack - If TXA unsuccessful, avoid hard packing, consult ENT for Nasopore - At discharge, SNF should be aware of above frequency of epistaxis and instructions for Afrin PRN to be available at bedside -- nosebleed lasting >1hr should be evaluated in ER  Osteomyelitis, acute on chronic, s/p hardware removal and I&D last Dec Had saucerization and closure of  wound for Tib/fib osteo with Dr. Elsa last month 03/16/24.  Missed follow up appointments. ID consulted this admission, recommended 6 weeks IV vancomycin  via Port.  OPAT orders in place Outpatient follow up with ID   Retained sutures Patient missed ortho follow up in December, still has retained sutures.  4 sutures were removed by Dr. Jonel on 1/26.  Orthopedics was contacted by him.   Patient mentioned that she was seen by Dr. Sherida on 1/27 who removed more sutures.  No note in the chart.  Outpatient follow-up.   Essential hypertension BP controlled Continue amlodipine  and lisinopril     Chronic pain Mood disorder Continue oxycodone , gabapentin , Xanax , trazodone .  Oxycodone  dose was increased on 1/28 due to poorly controlled pain.  Muscle relaxants were ordered.  Class 3 obesity Estimated body mass index is 45.6 kg/m as calculated from the following:   Height as of this encounter: 5' 4 (1.626 m).   Weight as of this encounter: 120.5 kg.   DVT prophylaxis: SCDs only.  No heparin  products due to high risk of bleeding. CODE STATUS: Full code Family communication: Discussed with patient.  No family at bedside. Disposition: SNF when bed is available.  Subjective: Patient feels well.  Had a minor episode of nosebleed overnight which resolved with Afrin.  Continues to have pain in her ankle but no worse compared to yesterday.   Objective: BP (!) 140/62 (BP Location: Left Arm)   Pulse (!) 55   Temp 98.3 F (36.8 C) (Oral)   Resp 18   Ht 5' 4 (1.626 m)   Wt 120.5 kg   SpO2 100%  BMI 45.60 kg/m    General appearance: Awake alert.  In no distress Resp: Clear to auscultation bilaterally.  Normal effort Cardio: S1-S2 is normal regular.  No S3-S4.  No rubs murmurs or bruit GI: Abdomen is soft.  Nontender nondistended.  Bowel sounds are present normal.  No masses organomegaly Dressing noted over the right ankle.  Data Reviewed:    Latest Ref Rng & Units 05/12/2024    5:00 AM  05/11/2024    2:30 AM 05/10/2024    5:58 AM  CBC  WBC 4.0 - 10.5 K/uL 7.5  8.5  8.5   Hemoglobin 12.0 - 15.0 g/dL 7.5  7.6  7.5   Hematocrit 36.0 - 46.0 % 25.5  25.3  25.7   Platelets 150 - 400 K/uL 297  328  317        Latest Ref Rng & Units 05/12/2024    5:00 AM 05/11/2024    2:30 AM 05/10/2024    5:58 AM  BMP  Glucose 70 - 99 mg/dL 895  869  891   BUN 8 - 23 mg/dL 10  10  11    Creatinine 0.44 - 1.00 mg/dL 9.17  9.25  9.22   Sodium 135 - 145 mmol/L 141  141  143   Potassium 3.5 - 5.1 mmol/L 3.8  3.9  4.0   Chloride 98 - 111 mmol/L 110  109  111   CO2 22 - 32 mmol/L 24  23  24    Calcium  8.9 - 10.3 mg/dL 8.5  8.5  8.4      Joette Pebbles, MD 05/13/2024 9:15 AM  For on call review [email or web address].   "

## 2024-05-13 NOTE — Discharge Summary (Signed)
 " [facility]  Physician Discharge Summary   Patient ID: Peggy House MRN: [id number] DOB/AGE: 64-Apr-1962 64 y.o.  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 Dennise for osteomyelitis Follow up with ENT Dr. Llewellyn for epistaxis (call office to confirm if patient should follow up with [facility] or [facility]) Follow up with [facility] (Dr. Stacia) for GI bleed Check CBC in 3-4 days In 1 week, stop carafate  In 7 weeks, reduce pantoprazole  to once daily See instructions below regarding what to do if patient has epistaxis Follow-up with Dr. Elsa with orthopedics for right ankle wound   Home Health: SNF Equipment/Devices: None  CODE STATUS: Full code  DISCHARGE CONDITION: fair  Diet recommendation: Regular as tolerated  INITIAL HISTORY: 64 y.o. F with hereditary hemorrhagic telangiectasia (HHT), hx AVMs, iron  deficiency anemia, DM, obesity and chronic osteomyelitis of the right fibula who presented with weakness, nosebleed and melena. Admitted and found to have Hgb 4.1.  Hospitalization prolonged by complications from HHT.   HOSPITAL COURSE:   Melena and GI bleeding due to AVMs Patient is seen by gastroenterology.   Underwent EGD showing >10 AVMs, the largest of which were treated with APC, procedure complicated by severe epistaxis at end of procedure requiring  intubation.  Follow up VCE showed scattered AVMs, but confirmed highest burden in stomach. Hemoglobin has been stable.  Checking every other day. Continue PPI and sucralfate .   Hereditary hemorrhagic telangiectasia (HHT) Acute on chronic blood loss anemia Iron  deficiency anemia Follows with Dr. Lanny for chronic iron  defiency anemia due to her chronic blood loss from HHT.  Typically has about 15 epistaxis per month in typical month. On q2 weeks IV iron  schedule with Dr. Lanny, last got IV iron  1/27.   Also on outpatient Avastin , received dose on 1/27. Continue aminocaproic  acid   Severe epistaxis due to HHT ENT Dr. Llewellyn consulted this admission.  Clots evacuated and Nasopore used.   As above, has typically 15 epistaxis a month.   In last week, these are mostly self-resolving or resolve with Afrin. - Afrin for nosebleed - If Afrin unsuccessful, can apply TXA to gauze and lightly apply/pack - If TXA unsuccessful, avoid hard packing, consult ENT for Nasopore - At discharge, SNF should be aware of above frequency of epistaxis and instructions for Afrin PRN to be available at bedside -- nosebleed lasting >1hr should be evaluated in ER   Osteomyelitis, acute on chronic, s/p hardware removal and I&D last Dec Had saucerization and closure of wound for Tib/fib osteo with Dr. Elsa last month 03/16/24.  Missed follow up appointments. ID consulted this admission, recommended 6 weeks IV vancomycin  via Port.  OPAT orders in place Outpatient follow up with ID   Retained sutures Patient missed ortho follow up in December, still has retained sutures.  4 sutures were removed by Dr. Jonel on 1/26.  Orthopedics was contacted by him.   Patient mentioned  that she was seen by Dr. Sherida on 1/27 who removed more sutures.  No note in the chart.  Outpatient follow-up.   Essential hypertension BP controlled Continue amlodipine  and lisinopril     Chronic pain Mood disorder Continue oxycodone , gabapentin ,  Xanax , trazodone .  Oxycodone  dose was increased on 1/28 due to poorly controlled pain.  Muscle relaxants were ordered.   Class 3 obesity Estimated body mass index is 45.6 kg/m as calculated from the following:   Height as of this encounter: 5' 4 (1.626 m).   Weight as of this encounter: 120.5 kg.  Patient is stable.  Okay for discharge to SNF today.   PERTINENT LABS:  The results of significant diagnostics from this hospitalization (including imaging, microbiology, ancillary and laboratory) are listed below for reference.     Labs:   Basic Metabolic Panel: Recent Labs  Lab 05/08/24 0700 05/09/24 0605 05/10/24 0558 05/11/24 0230 05/12/24 0500  NA 141 143 143 141 141  K 3.9 3.9 4.0 3.9 3.8  CL 111 111 111 109 110  CO2 23 23 24 23 24   GLUCOSE 113* 111* 108* 130* 104*  BUN 12 12 11 10 10   CREATININE 0.83 0.84 0.77 0.74 0.82  CALCIUM  8.5* 8.5* 8.4* 8.5* 8.5*   Liver Function Tests: Recent Labs  Lab 05/08/24 0700 05/09/24 0605 05/10/24 0558 05/12/24 0500  AST 12* 17 20 14*  ALT <5 <5 <5 <5  ALKPHOS 84 86 84 84  BILITOT 0.2 <0.2 <0.2 0.2  PROT 6.1* 6.5 6.4* 6.1*  ALBUMIN 3.2* 3.4* 3.4* 3.4*    CBC: Recent Labs  Lab 05/08/24 0700 05/09/24 0605 05/10/24 0558 05/11/24 0230 05/12/24 0500  WBC 8.6 8.3 8.5 8.5 7.5  HGB 7.5* 7.5* 7.5* 7.6* 7.5*  HCT 24.8* 25.1* 25.7* 25.3* 25.5*  MCV 87.0 87.2 87.1 86.9 86.4  PLT 315 345 317 328 297     CBG: Recent Labs  Lab 05/08/24 0633 05/09/24 0613 05/10/24 0724 05/11/24 0724 05/13/24 0812  GLUCAP 106* 105* 102* 106* 124*     IMAGING STUDIES ECHOCARDIOGRAM COMPLETE Result Date: 04/25/2024    ECHOCARDIOGRAM REPORT   Patient Name:   Peggy House Date of Exam: 04/25/2024 Medical Rec #:  [id number]              Height:       64.0 in Accession #:    [id number]             Weight:       238.5 lb Date of Birth:  05-Apr-1961              BSA:          2.109 m Patient Age:    64 years               BP:            136/63 mmHg Patient Gender: F                      HR:           119 bpm. Exam Location:  Inpatient Procedure: 2D Echo, Cardiac Doppler, Color Doppler and Strain Analysis (Both            Spectral and Color Flow Doppler were utilized during procedure). Indications:    R94.31 Abnormal EKG  History:        Patient has prior history of Echocardiogram examinations, most  recent 04/14/2023. Signs/Symptoms:Syncope,                 Dizziness/Lightheadedness and Fatigue; Risk Factors:Diabetes,                 Hypertension, Dyslipidemia and Current Smoker. Port.  Sonographer:    Ellouise Mose RDCS Referring Phys: [id number] [facility]  Sonographer Comments: Patient is obese. Image acquisition challenging due to patient body habitus. IMPRESSIONS  1. Left ventricular ejection fraction, by estimation, is 65 to 70%. Left ventricular ejection fraction by 2D MOD biplane is 69.5 %. The left ventricle has normal function. The left ventricle has no regional wall motion abnormalities. There is severe left ventricular hypertrophy. Left ventricular diastolic parameters are consistent with Grade II diastolic dysfunction (pseudonormalization). Elevated left atrial pressure. The average left ventricular global longitudinal strain is -24.3 %. The global longitudinal strain is normal.  2. Right ventricular systolic function is normal. The right ventricular size is moderately enlarged. There is severely elevated pulmonary artery systolic pressure. The estimated right ventricular systolic pressure is 60.2 mmHg.  3. Left atrial size was moderately dilated.  4. Right atrial size was moderately dilated.  5. The mitral valve is normal in structure. Trivial mitral valve regurgitation. No evidence of mitral stenosis.  6. Tricuspid valve regurgitation is mild to moderate.  7. There is a calcified nodule on the left coronary cusp (#32) and mild to moderate AS with PV 3.1 m/sec, MG 19 mmhg and DVI 0.46. There is evidence of high flow state  with LVOT VTI 29 cm. The aortic valve is tricuspid. Aortic valve regurgitation is trivial. Mild to moderate aortic valve stenosis.  8. There is Moderate (Grade III) atheroma plaque involving the aortic arch.  9. There is late systolic flow reversal of hepatic vein suggestive noncompliant RV. The inferior vena cava is dilated in size with <50% respiratory variability, suggesting right atrial pressure of 15 mmHg. Comparison(s): Prior images reviewed side by side. EF is similar to prior. There is now evidence of pulmonary HTN with RVSP 60, dilated RV/RA, and new aortic stenosis. FINDINGS  Left Ventricle: Left ventricular ejection fraction, by estimation, is 65 to 70%. Left ventricular ejection fraction by 2D MOD biplane is 69.5 %. The left ventricle has normal function. The left ventricle has no regional wall motion abnormalities. The average left ventricular global longitudinal strain is -24.3 %. Strain was performed and the global longitudinal strain is normal. The left ventricular internal cavity size was normal in size. There is severe left ventricular hypertrophy. Left ventricular diastolic parameters are consistent with Grade II diastolic dysfunction (pseudonormalization). Elevated left atrial pressure. Right Ventricle: The right ventricular size is moderately enlarged. No increase in right ventricular wall thickness. Right ventricular systolic function is normal. There is severely elevated pulmonary artery systolic pressure. The tricuspid regurgitant velocity is 3.36 m/s, and with an assumed right atrial pressure of 15 mmHg, the estimated right ventricular systolic pressure is 60.2 mmHg. Left [facility]: Left atrial size was moderately dilated. Right [facility]: Right atrial size was moderately dilated. Pericardium: There is no evidence of pericardial effusion. Mitral Valve: The mitral valve is normal in structure. Trivial mitral valve regurgitation. No evidence of mitral valve stenosis. Tricuspid Valve: The tricuspid  valve is normal in structure. Tricuspid valve regurgitation is mild to moderate. No evidence of tricuspid stenosis. Aortic Valve: There is a calcified nodule on the left coronary cusp (#32) and mild to moderate AS with PV 3.1 m/sec, MG 19 mmhg and DVI 0.46. There is  evidence of high flow state with LVOT VTI 29 cm. The aortic valve is tricuspid. Aortic valve regurgitation is trivial. Mild to moderate aortic stenosis is present. Aortic valve mean gradient measures 19.0 mmHg. Aortic valve peak gradient measures 39.4 mmHg. Aortic valve area, by VTI measures 1.45 cm. Pulmonic Valve: The pulmonic valve was normal in structure. Pulmonic valve regurgitation is not visualized. No evidence of pulmonic stenosis. Aorta: The aortic root and ascending aorta are structurally normal, with no evidence of dilitation. There is moderate (Grade III) atheroma plaque involving the aortic arch. Venous: A normal flow pattern is recorded from the right upper pulmonary vein. There is late systolic flow reversal of hepatic vein suggestive noncompliant RV. The inferior vena cava is dilated in size with less than 50% respiratory variability, suggesting right atrial pressure of 15 mmHg. IAS/Shunts: No atrial level shunt detected by color flow Doppler.  LEFT VENTRICLE PLAX 2D                        Biplane EF (MOD) LVIDd:         5.40 cm         LV Biplane EF:   Left LVIDs:         3.00 cm                          ventricular LV PW:         1.30 cm                          ejection LV IVS:        1.10 cm                          fraction by LVOT diam:     2.00 cm                          2D MOD LV SV:         91                               biplane is LV SV Index:   43                               69.5 %. LVOT Area:     3.14 cm LV IVRT:       85 msec         Diastology                                LV e' medial:    10.80 cm/s                                LV E/e' medial:  14.6 LV Volumes (MOD)               LV e' lateral:   11.20 cm/s LV vol  d, MOD    150.0 ml      LV E/e' lateral: 14.1 A2C: LV vol d, MOD    147.0 ml      2D Longitudinal A4C:  Strain LV vol s, MOD    50.4 ml       2D Strain GLS   -26.4 % A2C:                           (A4C): LV vol s, MOD    39.2 ml       2D Strain GLS   -23.8 % A4C:                           (A3C): LV SV MOD A2C:   99.6 ml       2D Strain GLS   -22.7 % LV SV MOD A4C:   147.0 ml      (A2C): LV SV MOD BP:    103.3 ml      2D Strain GLS   -24.3 %                                Avg: RIGHT VENTRICLE             IVC RV S prime:     14.40 cm/s  IVC diam: 2.90 cm TAPSE (M-mode): 2.9 cm                             PULMONARY VEINS                             Diastolic Velocity: 42.50 cm/s                             S/D Velocity:       1.40                             Systolic Velocity:  60.00 cm/s LEFT ATRIUM             Index        RIGHT ATRIUM           Index LA diam:        4.00 cm 1.90 cm/m   RA Area:     26.10 cm LA Vol (A2C):   95.1 ml 45.10 ml/m  RA Volume:   85.40 ml  40.50 ml/m LA Vol (A4C):   75.0 ml 35.57 ml/m LA Biplane Vol: 88.9 ml 42.16 ml/m  AORTIC VALVE AV Area (Vmax):    1.31 cm AV Area (Vmean):   1.49 cm AV Area (VTI):     1.45 cm AV Vmax:           314.00 cm/s AV Vmean:          187.750 cm/s AV VTI:            0.631 m AV Peak Grad:      39.4 mmHg AV Mean Grad:      19.0 mmHg LVOT Vmax:         131.00 cm/s LVOT Vmean:        89.300 cm/s LVOT VTI:          0.291 m LVOT/AV VTI ratio: 0.46  AORTA Ao Root diam: 3.30 cm Ao Asc diam:  3.70 cm MITRAL VALVE  TRICUSPID VALVE MV Area (PHT): 2.76 cm     TR Peak grad:   45.2 mmHg MV Decel Time: 275 msec     TR Vmax:        336.00 cm/s MV E velocity: 158.00 cm/s MV A velocity: 123.00 cm/s  SHUNTS MV E/A ratio:  1.28         Systemic VTI:  0.29 m                             Systemic Diam: 2.00 cm Joelle Cedars Tonleu Electronically signed by Joelle Cedars Ny Signature Date/Time: 04/25/2024/3:02:27 PM    Final    DG  CHEST PORT 1 VIEW Result Date: 04/23/2024 CLINICAL DATA:  Endotracheal intubation EXAM: PORTABLE CHEST 1 VIEW COMPARISON:  April 22, 2024 FINDINGS: Stable cardiomegaly. Endotracheal tube is noted distal tip 1.5 cm above the carina. Right internal jugular Port-A-Cath is unchanged. Right lung is clear. Mild left retrocardiac opacity is noted concerning for possible atelectasis or infiltrate and associated effusion. IMPRESSION: Endotracheal tube is noted with distal tip 1.5 cm above the carina. Mild left retrocardiac opacity is noted concerning for possible atelectasis or infiltrate and associated effusion. Electronically Signed   By: Lynwood Landy Raddle M.D.   On: 04/23/2024 15:32   US  EKG SITE RITE Result Date: 04/23/2024 If Site Rite image not attached, placement could not be confirmed due to current cardiac rhythm.  DG Chest 2 View Result Date: 04/22/2024 CLINICAL DATA:  Shortness of breath EXAM: CHEST - 2 VIEW COMPARISON:  March 04, 2024 FINDINGS: Stable cardiomegaly. Right internal jugular port is unchanged. Both lungs are clear. The visualized skeletal structures are unremarkable. IMPRESSION: No active cardiopulmonary disease. Electronically Signed   By: Lynwood Landy Raddle M.D.   On: 04/22/2024 14:08    DISCHARGE EXAMINATION: See progress note from earlier today  DISPOSITION: SNF  Discharge Instructions     [facility] pharmacist to adjust dose for Vancomycin , Aminoglycosides and other anti-infective therapies as requested by physician.   Complete by: As directed    [facility] to provide Cath Flo 2mg    Complete by: As directed    Administer for PICC line occlusion and as ordered by physician for other access device issues.   Anaphylaxis Kit: Provided to treat any anaphylactic reaction to the medication being provided to the patient if First Dose or when requested by physician   Complete by: As directed    Epinephrine  1mg /ml vial / amp: Administer 0.3mg  (0.68ml)  subcutaneously once for moderate to severe anaphylaxis, nurse to call physician and pharmacy when reaction occurs and call 911 if needed for immediate care   Diphenhydramine  50mg /ml IV vial: Administer 25-50mg  IV/IM PRN for first dose reaction, rash, itching, mild reaction, nurse to call physician and pharmacy when reaction occurs   Sodium Chloride  0.9% NS 500ml IV: Administer if needed for hypovolemic blood pressure drop or as ordered by physician after call to physician with anaphylactic reaction   Call MD for:  difficulty breathing, headache or visual disturbances   Complete by: As directed    Call MD for:  extreme fatigue   Complete by: As directed    Call MD for:  persistant dizziness or light-headedness   Complete by: As directed    Call MD for:  persistant nausea and vomiting   Complete by: As directed    Call MD for:  severe uncontrolled pain   Complete by: As directed  Call MD for:  temperature >100.4   Complete by: As directed    Change dressing on IV access line weekly and PRN   Complete by: As directed    Discharge instructions   Complete by: As directed    Please review instructions on the discharge summary.  You were cared for by a hospitalist during your hospital stay. If you have any questions about your discharge medications or the care you received while you were in the hospital after you are discharged, you can call the unit and asked to speak with the hospitalist on call if the hospitalist that took care of you is not available. Once you are discharged, your primary care physician will handle any further medical issues. Please note that NO REFILLS for any discharge medications will be authorized once you are discharged, as it is imperative that you return to your primary care physician (or establish a relationship with a primary care physician if you do not have one) for your aftercare needs so that they can reassess your need for medications and monitor your lab values. If  you do not have a primary care physician, you can call [phone] for a physician referral.   Discharge wound care:   Complete by: As directed    Cleanse R ankle/lower leg wound with Vashe, do not rinse. Apply Vashe moistened gauze to wound bed daily, cover with dry gauze and silicone foam or Kerlix roll gauze whichever is preferred.   Discharge wound care:   Complete by: As directed    Wound care  Daily      Comments: Cleanse R ankle/lower leg wound with Vashe, do not rinse. Apply Vashe moistened gauze to wound bed daily, cover with dry gauze and silicone foam or Kerlix roll gauze whichever is preferred.   Flush IV access with Sodium Chloride  0.9% and Heparin  10 units/ml or 100 units/ml   Complete by: As directed    Home infusion instructions - [facility]   Complete by: As directed    Instructions: Flush IV access with Sodium Chloride  0.9% and Heparin  10units/ml or 100units/ml   Change dressing on IV access line: Weekly and PRN   Instructions Cath Flo 2mg : Administer for PICC Line occlusion and as ordered by physician for other access device   [facility] pharmacist to adjust dose for: Vancomycin , Aminoglycosides and other anti-infective therapies as requested by physician   Increase activity slowly   Complete by: As directed    Increase activity slowly   Complete by: As directed    Method of administration may be changed at the discretion of home infusion pharmacist based upon assessment of the patient and/or caregivers ability to self-administer the medication ordered   Complete by: As directed    TREATMENT CONDITIONS   Complete by: As directed    Check BP prior to each cycle of bevacizumab . HOLD if BP>160/100 and contact MD.  Notify MD if patient has unstable vital signs: Temperature > = 100.58F, RR > 30 or HR > 100. Patient should have CBC within 7 days prior to chemotherapy administration. NOTIFY MD IF: ANC < 1500, Hemoglobin < 8, or PLT < 100,000. OK to treat if  urine protein 300 or less Ok to treat if Hg>5.0         Allergies as of 05/13/2024       Reactions   Feraheme  [ferumoxytol ] Other (See Comments)   Muscle tetany, encephalopathy, fatigue, nausea (reaction to injection)   Nsaids Other (See Comments)  Stomach bleeding episodes; Tylenol  is OK   Wasp Venom Anaphylaxis   Tomato Hives   Iron  (ferrous Sulfate ) [ferrous Sulfate  Er] Other (See Comments)   Shock        Medication List     STOP taking these medications    linezolid  600 MG tablet Commonly known as: ZYVOX    oxyCODONE  5 MG immediate release tablet Commonly known as: Roxicodone        TAKE these medications    acetaminophen  325 MG tablet Commonly known as: Tylenol  Take 2 tablets (650 mg total) by mouth every 6 (six) hours as needed. What changed: reasons to take this   ALPRAZolam  0.5 MG tablet Commonly known as: XANAX  Take 1 tablet (0.5 mg total) by mouth at bedtime as needed for anxiety. What changed:  when to take this reasons to take this   aminocaproic  acid 500 MG tablet Commonly known as: AMICAR  Take 2 tablets (1,000 mg total) by mouth every 8 (eight) hours.   amLODipine  10 MG tablet Commonly known as: NORVASC  Take 1 tablet (10 mg total) by mouth daily.   citalopram  20 MG tablet Commonly known as: CELEXA  Take 1 tablet (20 mg total) by mouth daily.   FT Pain Relief Max Strength 4 % Generic drug: lidocaine  Place 1 patch onto the skin daily. Remove & Discard patch within 12 hours or as directed by MD   gabapentin  300 MG capsule Commonly known as: NEURONTIN  Take 1 capsule (300 mg total) by mouth at bedtime. What changed:  medication strength how much to take   leptospermum manuka honey Pste paste Apply 1 Application topically daily.   lisinopril  20 MG tablet Commonly known as: ZESTRIL  Take 1 tablet (20 mg total) by mouth daily.   methocarbamol  500 MG tablet Commonly known as: ROBAXIN  Take 1 tablet (500 mg total) by mouth 3 (three)  times daily.   nicotine  14 mg/24hr patch Commonly known as: NICODERM CQ  - dosed in mg/24 hours Place 1 patch (14 mg total) onto the skin daily.   oxyCODONE -acetaminophen  5-325 MG tablet Commonly known as: PERCOCET/ROXICET Take 2 tablets by mouth every 4 (four) hours as needed for severe pain (pain score 7-10).   pantoprazole  40 MG tablet Commonly known as: PROTONIX  Take 1 tablet (40 mg total) by mouth 2 (two) times daily. TAKE 1 TABLET(40 MG) BY MOUTH TWICE DAILY   polyethylene glycol 17 g packet Commonly known as: MIRALAX  / GLYCOLAX  Take 17 g by mouth daily.   senna 8.6 MG Tabs tablet Commonly known as: SENOKOT Take 1 tablet (8.6 mg total) by mouth 2 (two) times daily.   sucralfate  1 GM/10ML suspension Commonly known as: CARAFATE  Take 10 mLs (1 g total) by mouth 4 (four) times daily -  with meals and at bedtime.   traZODone  50 MG tablet Commonly known as: DESYREL  Take 1 tablet (50 mg total) by mouth at bedtime. What changed:  when to take this reasons to take this   vancomycin  IVPB Inject 1,750 mg into the vein daily for 24 days. Indication:  Osteomyelitis First Dose: Yes Last Day of Therapy:  06/04/2024 Labs - weekly:  CBC/D, BMP, and vancomycin  trough. Labs - weekly:  BMP and vancomycin  trough Labs - Once weekly: ESR and CRP Method of administration:Elastomeric or per protocol Method of administration may be changed at the discretion of the patient and/or caregiver's ability to self-administer the medication ordered.               Discharge Care Instructions  (From admission,  onward)           Start     Ordered   05/13/24 0000  Discharge wound care:       Comments: Wound care  Daily      Comments: Cleanse R ankle/lower leg wound with Vashe, do not rinse. Apply Vashe moistened gauze to wound bed daily, cover with dry gauze and silicone foam or Kerlix roll gauze whichever is preferred.   05/13/24 1134   05/05/24 0000  Change dressing on IV access line  weekly and PRN  (Home infusion instructions - [facility] )        05/05/24 1350   05/05/24 0000  Discharge wound care:       Comments: Cleanse R ankle/lower leg wound with Vashe, do not rinse. Apply Vashe moistened gauze to wound bed daily, cover with dry gauze and silicone foam or Kerlix roll gauze whichever is preferred.   05/05/24 1350              Contact information for follow-up providers     Kandis Perkins, DO Follow up.   Specialty: Internal Medicine Why: TIME : 10:15 AM DATE : JANUARY 26 , 2026 MONDAY  PLEASE BRING ALL CURRENT MEDICATION,ID and INS CARD Contact information: [address], [address] [place] [address] [phone]         Dennise Kingsley, MD Follow up.   Specialty: Infectious Diseases Contact information: [address] [place] [address] [phone]         Stacia Glendia BRAVO, MD Follow up.   Specialty: Gastroenterology Contact information: [address] [place] [address] [phone]         Skotnicki, Gerard A, DO Follow up.   Specialty: Otolaryngology Contact information: [address] [place] [address] [place] [address] [phone]         Lanny Callander, MD Follow up.   Specialties: Hematology, Oncology Contact information: [address] [place] [address] [phone]         Elsa Lonni SAUNDERS, MD. Schedule an appointment as soon as possible for a visit in 1 week(s).   Specialty: Orthopedic Surgery Why: post hospitalization follow up, For wound re-check Contact information: [address] [place] [address] [phone]              Contact information for after-discharge care     Destination     [facility] .   Service: Skilled Nursing Contact information: [address] [place]  [address] [phone]                     TOTAL DISCHARGE TIME: 35 mins  Chenelle Benning  [facility] Pager on [email or web address]  05/13/2024, 11:35 AM    "

## 2024-05-13 NOTE — Discharge Instructions (Signed)
"   At discharge, SNF should be aware of above frequency of epistaxis and instructions for Afrin PRN to be available at bedside -- nosebleed lasting >1hr should be evaluated in ER   "

## 2024-05-13 NOTE — Plan of Care (Signed)
  Problem: Education: Goal: Knowledge of General Education information will improve Description: Including pain rating scale, medication(s)/side effects and non-pharmacologic comfort measures Outcome: Adequate for Discharge   Problem: Health Behavior/Discharge Planning: Goal: Ability to manage health-related needs will improve Outcome: Adequate for Discharge   Problem: Clinical Measurements: Goal: Ability to maintain clinical measurements within normal limits will improve Outcome: Adequate for Discharge Goal: Will remain free from infection Outcome: Adequate for Discharge Goal: Diagnostic test results will improve Outcome: Adequate for Discharge Goal: Respiratory complications will improve Outcome: Adequate for Discharge Goal: Cardiovascular complication will be avoided Outcome: Adequate for Discharge   Problem: Activity: Goal: Risk for activity intolerance will decrease Outcome: Adequate for Discharge   Problem: Nutrition: Goal: Adequate nutrition will be maintained Outcome: Adequate for Discharge   Problem: Coping: Goal: Level of anxiety will decrease Outcome: Adequate for Discharge   Problem: Elimination: Goal: Will not experience complications related to bowel motility Outcome: Adequate for Discharge Goal: Will not experience complications related to urinary retention Outcome: Adequate for Discharge   Problem: Pain Managment: Goal: General experience of comfort will improve and/or be controlled Outcome: Adequate for Discharge   Problem: Safety: Goal: Ability to remain free from injury will improve Outcome: Adequate for Discharge   Problem: Skin Integrity: Goal: Risk for impaired skin integrity will decrease Outcome: Adequate for Discharge   Problem: Activity: Goal: Ability to tolerate increased activity will improve Outcome: Adequate for Discharge   Problem: Respiratory: Goal: Ability to maintain a clear airway and adequate ventilation will  improve Outcome: Adequate for Discharge   Problem: Role Relationship: Goal: Method of communication will improve Outcome: Adequate for Discharge

## 2024-05-19 NOTE — ED Provider Notes (Signed)
 Chief Complaint  Patient presents with   Abnormal Labs    Pt BIB EMS from [facility] with a hgb of 6.5. pt has a hx of needing blood transfusions.     Sakai Heinle is a 64 y.o. female with a past medical history of anemia, diabetes who presents with referral from assisted living facility, [facility] due to anemia with hemoglobin 6.5 from outpatient lab.  The patient states she does have a history of GI bleeds in the past.  She is not currently on any anticoagulation at this time.  She has no complaints currently.   Review of Systems  Constitutional:  Negative for chills and fever.  HENT:  Negative for ear pain and sore throat.   Eyes:  Negative for pain and visual disturbance.  Respiratory:  Negative for cough and shortness of breath.   Cardiovascular:  Negative for chest pain and palpitations.  Gastrointestinal:  Negative for abdominal pain and vomiting.  Genitourinary:  Negative for dysuria and hematuria.  Musculoskeletal:  Negative for arthralgias and back pain.  Skin:  Negative for color change and rash.  Neurological:  Negative for seizures and syncope.  All other systems reviewed and are negative.   Past Medical History:  Diagnosis Date   Anemia    AVM (arteriovenous malformation) (*)    Diabetes mellitus (*)    Hereditary hemorrhagic telangiectasia    History reviewed. No pertinent surgical history. Family History[1]   Social History   Tobacco Use   Smoking status: Never   Smokeless tobacco: Never  Substance Use Topics   Alcohol use: Not on file   There are no discharge medications for this patient.  Allergies[2]  PHYSICAL EXAM: ED Triage Vitals [05/19/24 1246]  Encounter Vitals Group     BP (!) 153/70     Girls Systolic BP Percentile      Girls Diastolic BP Percentile      Boys Systolic BP Percentile      Boys Diastolic BP Percentile      Heart Rate 58     Resp 18     Temp 98.2 F (36.8 C)     Temp src Oral     SpO2 97 %     Weight  114.3 kg (252 lb)     Height      Head Circumference      Peak Flow      Pain Score Nine     Pain Loc      Pain Education      Exclude from Growth Chart    Physical Exam Vitals and nursing note reviewed.  Constitutional:      General: She is not in acute distress.    Appearance: She is well-developed. She is obese. She is not ill-appearing or toxic-appearing.  HENT:     Head: Normocephalic and atraumatic.     Mouth/Throat:     Mouth: Mucous membranes are moist.     Pharynx: Oropharynx is clear.  Eyes:     Extraocular Movements: Extraocular movements intact.     Conjunctiva/sclera: Conjunctivae normal.  Cardiovascular:     Rate and Rhythm: Normal rate and regular rhythm.     Heart sounds: No murmur heard. Pulmonary:     Effort: Pulmonary effort is normal. No respiratory distress.     Breath sounds: Normal breath sounds.  Abdominal:     General: There is no distension.     Palpations: Abdomen is soft.     Tenderness: There is no  abdominal tenderness.  Genitourinary:    CommentsBETHA Bottcher, RN at bedside.  No anal fissure or external hemorrhoids noted.  DRE with darker maroon colored stool that is Hemoccult positive. No melanotic or bright red blood per rectum. Musculoskeletal:        General: No swelling.     Cervical back: Neck supple.     Comments: Port noted to right chest wall  Skin:    General: Skin is warm and dry.     Capillary Refill: Capillary refill takes less than 2 seconds.     Comments: Wound noted to right ankle, wrapped.  Neurological:     General: No focal deficit present.     Mental Status: She is alert and oriented to person, place, and time. Mental status is at baseline.  Psychiatric:        Mood and Affect: Mood normal.     DIAGNOSTICS:  Pulse Ox: Pulse oximetry 97% on RA indicating adequate oxygenation.  Imaging: CT Angio Abdomen Pelvis  Final Result  IMPRESSION:    1.  No visualized active GI bleed.  2. Multiple endoscopy clips in the  stomach.  3. Moderate colonic stool. Minimal colonic diverticulosis without diverticulitis.  4. Cholelithiasis without cholecystitis.    Electronically Signed by: Reyes Luna, MD on 05/19/2024 2:57 PM     Labs: Labs Reviewed  CBC AND DIFFERENTIAL - Abnormal; Notable for the following components:      Result Value   RBC 3.55 (*)    HGB 8.8 (*)    HCT 30.4 (*)    MCH 24.8 (*)    MCHC 28.9 (*)    RDW SD 63.1 (*)    MPV 8.8 (*)    All other components within normal limits  COMPREHENSIVE METABOLIC PANEL - Abnormal; Notable for the following components:   Potassium 3.6 (*)    BUN 7 (*)    Ca 8.0 (*)    Alb 3.5 (*)    BUN/CREAT RATIO 9 (*)    All other components within normal limits  PROTIME-INR - Abnormal; Notable for the following components:   PT 15.1 (*)    All other components within normal limits  PTT - Abnormal; Notable for the following components:   PTT 118 (*)    All other components within normal limits  MANUAL DIFFERENTIAL - Abnormal; Notable for the following components:   Absolute Lymphocyte Count, Manual 0.37 (*)    TARGET CELL Few (*)    HYPOCHROM Moderate (*)    ELLIPTOCYTES Few (*)    TEAR CELL Few (*)    Total Absolute Lymphocyte Count Manual 0.50 (*)    All other components within normal limits  POCT FECAL OCCULT BLOOD - Abnormal; Notable for the following components:   Fecal Occult Blood Positive (*)    All other components within normal limits  TYPE AND SCREEN  PREVIOUS BLOOD BANK HISTORY?  ABO RH CONFIRMATION    ED COURSE AND TREATMENT:  Patients condition remained stable during Emergency Department evaluation.   Previous medical records requested. None  Orders written.  Diagnostics reviewed.   Medications  iopamidol  (ISOVUE -370) 76% injection 100 mL (100 mLs IntraVENous Given 05/19/24 1422)  potassium chloride  (K-DUR,KLOR-CON ) CR tablet 40 mEq (40 mEq Oral Given 05/19/24 1538)    BP 147/65 (BP Location: Left Upper Arm, Patient Position:  Lying)   Pulse 58   Temp 98.2 F (36.8 C) (Oral)   Resp 18   Wt 114.3 kg (252 lb)   SpO2 96%  ED Course as of 05/19/24 1612  [facility] Documentation  Wed May 19, 2024  1411 Hemoglobin(!): 8.8  1434 I spoke with Lucious, staff member from [facility] and rehab center who state they had outpatient lab obtained yesterday which showed hemoglobin 6.5. He understands that our repeat lab is 8.8 today in the ED.  1511 I spoke with the patient's son, who is on the way to see her in the ED. He states the will take the patient back to [facility] from the ED.    CLINICAL IMPRESSION: 1. Anemia, unspecified type   2. Occult blood positive stool       CONDITION ON DISCHARGE:  Stable  PLAN AND FOLLOW-UP:   The evaluation and treatment you received in the Emergency Department was rendered on an urgent or emergent basis only. Your evaluation and treatment today was not intended to be a substitute for, or an effort to provide complete continuing medical care. It is very important that you establish care or follow up with a primary care provider for new, remaining or continuing medical problems because it is not always possible to recognize and treat all elements of injuries or illnesses in a single visit. Please see your primary care provider in the next 1-2 days and arrange for other follow-up care as we discussed.    If you do not have a primary care provider, please follow up with:  [facility] or [facility]  Your hemoglobin is 8.8 today in the ED, therefore no indication for blood transfusion. You did have a positive hemoccult test. You still need to follow up with your primary care and gastroenterology as outpatient. Return to the ED immediately without fail if your symptoms get worse, do not get better, or any other symptoms you feel need to be evaluated in the emergency department.  PRESCRIPTIONS:    Medication List    You have not been  prescribed any medications.     MEDICAL DECISION MAKING:: MDM Number of Diagnoses or Management Options Anemia, unspecified type Occult blood positive stool Diagnosis management comments: A 64 year old female presents from [facility] assisted facility due to low hemoglobin of 6.5 from outpatient labs. She is afebrile and hemodynamically stable.  Patient report a history of GI bleeds in the past requiring blood transfusion.  She has no complaints at this time.  DRE with dark maroon-colored stool that is Hemoccult positive.  Will repeat lab work and obtain CT angio abd/pelvis to exclude active GI bleed.  Hemoglobin is 8.8 today, the patient does not require emergent blood transfusion.  CTA pelvis shows no active GI bleed.  Will have the patient follow back up with primary care and gastroenterology as outpatient. At this time, there are no immediate life threatening conditions within the ED.  Strict return precautions were provided to the patient who understands and agrees with the plan.  The patient is hemodynamically stable for discharge.    Amount and/or Complexity of Data Reviewed Clinical lab tests: ordered and reviewed Tests in the radiology section of CPT: ordered and reviewed Review and summarize past medical records: yes Independent visualization of images, tracings, or specimens: yes  Patient Progress Patient progress: stable    The patient was seen, evaluated and managed by ED attending physician, Ludie Primmer, D.O.       [1] No family history on file. [2] Allergies Allergen Reactions   Nsaids Bleeding    In stomach   Wasp Venom Itching  Feraheme  [Ferumoxytol ] Unknown   Ferrous Sulfate  Unknown    Pt reports convulsions   Tomato Rash   Ludie Primmer, DO 05/19/24 1612

## 2024-05-21 ENCOUNTER — Inpatient Hospital Stay: Admitting: Hematology

## 2024-05-21 ENCOUNTER — Ambulatory Visit: Admitting: Gastroenterology

## 2024-05-21 ENCOUNTER — Inpatient Hospital Stay

## 2024-05-21 ENCOUNTER — Inpatient Hospital Stay: Attending: Hematology

## 2024-05-21 ENCOUNTER — Other Ambulatory Visit: Payer: Self-pay

## 2024-05-21 NOTE — Progress Notes (Deleted)
 "  Chief Complaint:hospital follow-up Primary GI Doctor:Dr. 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 She underwent EGD which was complicated by severe epistaxis at the onset of the procedure, requiring emergent intubation, nasal packing and transferred to the ICU until epistaxis hemostasis could be definitively determined.   EGD with numerous gastric AVMs, 5 were treated with APC, some with Hemoclip. Video capsule endoscopy with several small nonbleeding AVMs and an incidental submucosal nodule  05/19/24 seen in ED [facility] via EMS for rectal bleeding. hemoglobin is 8.8. Positive hemocuult test.   Interval History  Patient admits/denies GERD Patient admits/denies dysphagia Patient admits/denies nausea, vomiting, or weight loss  Patient admits/denies altered bowel habits Patient admits/denies abdominal pain Patient admits/denies rectal bleeding   Denies/Admits alcohol Denies/Admits smoking Denies/Admits NSAID use. Denies/Admits they are on blood thinners.  Surgical history:  Patient's family history includes   Gi procedures  04/26/24 Capsule endoscopy   EGD 04/23/24 with Dr. Stacia  - Normal esophagus. - Multiple ( 12- 15) non- bleeding angioectasias in the stomach. Five were treated with argon plasma coagulation ( APC) . Clips ( MR conditional) were placed. Clip manufacturer: [employer]. - Nodular mucosa in the duodenal bulb and in the second portion of the duodenum. - A single non- bleeding angioectasia in the duodenum. - Successful completion of the Video Capsule Enteroscope placement. - No specimens collected. - Severe epistaxis requiring emergent intubation. Due to severity of her bleeding, and concern for airway protection upon extubation, the decision was made to keep patient  intubated until more definitive hemostasis could be achieved.   EGD 02/2024 with Dr. San - Normal esophagus. - 11 non- bleeding angioectasias in the stomach. Treated with argon plasma coagulation ( APC) . - Non- bleeding gastric ulcers with pigmented material. Clips ( MR conditional) were placed. Clip manufacturer: [employer]. - Erythematous duodenopathy. - Three non- bleeding angioectasias in the duodenum. Treated with argon plasma coagulation ( APC) . - No specimens collected.  EGD 09/2022 with Dr. Kriss - 2 cm hiatal hernia. - An endoclip was found in the stomach. - Non- bleeding gastric ulcer with possible AVM involvement. Clips were placed. - Normal examined duodenum. - No specimens collected.  EGD 05/2022 with Dr Burnette - Normal esophagus. - Two recently bleeding angioectasias in the stomach. Clips were placed. Pura stat gel placed. No further bleeding noted at end of procedure. - Normal duodenal bulb, first portion of the duodenum and second portion of the duodenum. - No specimens collected.  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   History of blood transfusion 03/08/2024   History of swelling of feet    hx - no current problems per pt on 03/10/24   Hyperlipidemia    Hypertension    Major depressive disorder, recurrent episode 06/05/2015   Obesity    Pneumonia    x 2   Snores    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   no meds, diet controlled, does not check blood sugar    Past Surgical History:  Procedure Laterality Date   ABDOMINAL HYSTERECTOMY     APPLICATION OF WOUND VAC Right  07/28/2023   Procedure: APPLICATION, WOUND VAC;  Surgeon: Lowery Estefana RAMAN, DO;  Location: [facility] OR;  Service: Plastics;  Laterality: Right;   APPLICATION OF WOUND VAC Right 03/16/2024   Procedure: APPLICATION, WOUND VAC;  Surgeon: Elsa Lonni SAUNDERS, MD;  Location: [facility] OR;  Service: Orthopedics;  Laterality: Right;   APPLICATION, SKIN SUBSTITUTE Right 07/28/2023   Procedure: APPLICATION, SKIN SUBSTITUTE;  Surgeon: Lowery Estefana RAMAN, DO;  Location: [facility] OR;  Service: Plastics;  Laterality: Right;   CARPAL TUNNEL RELEASE  05/13/2011   Procedure: CARPAL TUNNEL RELEASE;  Surgeon: Eva Elsie Herring, MD;  Location: [facility];  Service: Orthopedics;  Laterality: Left;   COLONOSCOPY N/A 03/02/2020   Procedure: COLONOSCOPY;  Surgeon: Donnald Charleston, MD;  Location: [facility] ENDOSCOPY;  Service: Endoscopy;  Laterality: N/A;   COLONOSCOPY WITH PROPOFOL  N/A 04/28/2014   Procedure: COLONOSCOPY WITH PROPOFOL ;  Surgeon: Charleston Donnald GAILS, MD;  Location: [facility] ENDOSCOPY;  Service: Endoscopy;  Laterality: N/A;   DG TOES*L*  05/16/2008   rt   DILATION AND CURETTAGE OF UTERUS     ENTEROSCOPY N/A 10/17/2017   Procedure: ENTEROSCOPY;  Surgeon: Elicia Claw, MD;  [facility]: [facility]OSCOPY;  Service: Gastroenterology;  Laterality: N/A;   ESOPHAGOGASTRODUODENOSCOPY N/A 04/10/2014   Procedure: ESOPHAGOGASTRODUODENOSCOPY (EGD);  Surgeon: Jerrell KYM Sol, MD;  Location: [facility] ENDOSCOPY;  Service: Endoscopy;  Laterality: N/A;   ESOPHAGOGASTRODUODENOSCOPY N/A 05/10/2017   Procedure: ESOPHAGOGASTRODUODENOSCOPY (EGD);  Surgeon: Donnald Charleston, MD;  Location: [facility] ENDOSCOPY;  Service: Endoscopy;  Laterality: N/A;   ESOPHAGOGASTRODUODENOSCOPY N/A 09/22/2017   Procedure: ESOPHAGOGASTRODUODENOSCOPY (EGD);  Surgeon: Rosalie Kitchens, MD;  Location: [facility] ENDOSCOPY;  Service: Endoscopy;  Laterality: N/A;  bedside   ESOPHAGOGASTRODUODENOSCOPY N/A 03/02/2020   Procedure: ESOPHAGOGASTRODUODENOSCOPY (EGD);  Surgeon:  Donnald Charleston, MD;  Location: [facility] ENDOSCOPY;  Service: Endoscopy;  Laterality: N/A;   ESOPHAGOGASTRODUODENOSCOPY N/A 11/03/2020   Procedure: ESOPHAGOGASTRODUODENOSCOPY (EGD);  Surgeon: Sol Jerrell, MD;  Location: [facility] ENDOSCOPY;  Service: Endoscopy;  Laterality: N/A;   ESOPHAGOGASTRODUODENOSCOPY N/A 04/12/2022   Procedure: ESOPHAGOGASTRODUODENOSCOPY (EGD);  Surgeon: Rosalie Kitchens, MD;  Location: [facility] ENDOSCOPY;  Service: Gastroenterology;  Laterality: N/A;   ESOPHAGOGASTRODUODENOSCOPY N/A 05/27/2022   Procedure: ESOPHAGOGASTRODUODENOSCOPY (EGD);  Surgeon: Burnette Elsie, MD;  Location: [facility]COPY;  Service: Gastroenterology;  Laterality: N/A;   ESOPHAGOGASTRODUODENOSCOPY N/A 09/27/2022   Procedure: ESOPHAGOGASTRODUODENOSCOPY (EGD);  Surgeon: Kriss Estefana DEL, DO;  Location: [facility] ENDOSCOPY;  Service: Gastroenterology;  Laterality: N/A;   ESOPHAGOGASTRODUODENOSCOPY N/A 03/06/2024   Procedure: EGD (ESOPHAGOGASTRODUODENOSCOPY);  Surgeon: San Sandor GAILS, DO;  Location: [facility] ENDOSCOPY;  Service: Gastroenterology;  Laterality: N/A;   ESOPHAGOGASTRODUODENOSCOPY N/A 04/23/2024   Procedure: EGD (ESOPHAGOGASTRODUODENOSCOPY);  Surgeon: Stacia Glendia BRAVO, MD;  Location: [facility] ENDOSCOPY;  Service: Gastroenterology;  Laterality: N/A;  Plan is to drop capsule during EGD   ESOPHAGOGASTRODUODENOSCOPY (EGD) WITH PROPOFOL  N/A 04/27/2014   Procedure: ESOPHAGOGASTRODUODENOSCOPY (EGD) WITH PROPOFOL ;  Surgeon: Charleston Donnald GAILS, MD;  Location: [facility] ENDOSCOPY;  Service: Endoscopy;  Laterality: N/A;  possible apc   ESOPHAGOGASTRODUODENOSCOPY (EGD) WITH PROPOFOL  N/A 09/30/2017   Procedure: ESOPHAGOGASTRODUODENOSCOPY (EGD) WITH PROPOFOL ;  Surgeon: Saintclair Jasper, MD;  Location: [facility] ENDOSCOPY;  Service: Gastroenterology;  Laterality: N/A;   ESOPHAGOGASTRODUODENOSCOPY (EGD) WITH PROPOFOL  N/A 10/01/2017   Procedure: ESOPHAGOGASTRODUODENOSCOPY (EGD) WITH PROPOFOL ;  Surgeon: Saintclair Jasper, MD;  [facility] ENDOSCOPY;  Service:  Gastroenterology;  Laterality: N/A;   ESOPHAGOGASTRODUODENOSCOPY (EGD) WITH PROPOFOL  N/A 10/08/2017   Procedure: ESOPHAGOGASTRODUODENOSCOPY (EGD) WITH PROPOFOL ;  Surgeon: Elicia Claw, MD;  [facility] ENDOSCOPY;  Service: Gastroenterology;  Laterality: N/A;   ESOPHAGOGASTRODUODENOSCOPY (EGD) WITH PROPOFOL  N/A 10/17/2017   Procedure: ESOPHAGOGASTRODUODENOSCOPY (EGD) WITH PROPOFOL ;  Surgeon: Elicia Claw, MD;  [facility] ENDOSCOPY;  Service: Gastroenterology;  Laterality: N/A;   ESOPHAGOGASTRODUODENOSCOPY (EGD) WITH PROPOFOL  N/A 10/19/2017   Procedure: ESOPHAGOGASTRODUODENOSCOPY (EGD) WITH PROPOFOL ;  Surgeon: Elicia Claw, MD;  [facility]OS[facility];  Service: Gastroenterology;  Laterality: N/A;   ESOPHAGOGASTRODUODENOSCOPY (EGD) WITH PROPOFOL  N/A 12/04/2018   Procedure: ESOPHAGOGASTRODUODENOSCOPY (EGD) WITH PROPOFOL ;  Surgeon: Dianna Specking, MD;  Location: [facility] ENDOSCOPY;  Service: Endoscopy;  Laterality: N/A;   GIVENS CAPSULE STUDY N/A 10/02/2017   Procedure: GIVENS CAPSULE STUDY;  Surgeon: Saintclair Jasper, MD;  Location: [facility] ENDOSCOPY;  Service: Gastroenterology;  Laterality: N/A;   GIVENS CAPSULE STUDY N/A 10/08/2017   Procedure: GIVENS CAPSULE STUDY;  Surgeon: Elicia Claw, MD;  Location[facility] ENDOSCOPY;  Service: Gastroenterology;  Laterality: N/A;  endoscopic placement of capsule   GIVENS CAPSULE STUDY N/A 03/02/2020   Procedure: GIVENS CAPSULE STUDY;  Surgeon: Donnald Charleston, MD;  Location: [facility] ENDOSCOPY;  Service: Endoscopy;  Laterality: N/A;   GIVENS CAPSULE STUDY N/A 04/23/2024   Procedure: IMAGING PROCEDURE, GI TRACT, INTRALUMINAL, VIA CAPSULE;  Surgeon: Stacia Glendia BRAVO, MD;  Location: [facility] ENDOSCOPY;  Service: Gastroenterology;  Laterality: N/A;   HARDWARE REMOVAL Right 07/15/2023   Procedure: REMOVAL, HARDWARE;  Surgeon: Sherida Adine BROCKS, MD;  Location[facility] OR;  Service: Orthopedics;  Laterality: Right;   HEMOSTASIS CLIP PLACEMENT  12/04/2018   Procedure: HEMOSTASIS  CLIP PLACEMENT;  Surgeon: Dianna Specking, MD;  Location: [facility] ENDOSCOPY;  Service: Endoscopy;;   HEMOSTASIS CLIP PLACEMENT  05/27/2022   Procedure: HEMOSTASIS CLIP PLACEMENT;  Surgeon: Burnette Fallow, MD;  Location: [facility] ENDOSCOPY;  Service: Gastroenterology;;   HEMOSTASIS CLIP PLACEMENT  09/27/2022   Procedure: HEMOSTASIS CLIP PLACEMENT;  Surgeon: Kriss Estefana DEL, DO;  Location[facility] ENDOSCOPY;  Service: Gastroenterology;;   HEMOSTASIS CLIP PLACEMENT  04/23/2024   Procedure: CONTROL OF HEMORRHAGE, GI TRACT, ENDOSCOPIC, BY CLIPPING OR OVERSEWING;  Surgeon: Stacia Glendia BRAVO, MD;  Location: [facility] ENDOSCOPY;  Service: Gastroenterology;;   HEMOSTASIS CONTROL  05/27/2022   Procedure: HEMOSTASIS CONTROL;  Surgeon: Burnette Fallow, MD;  Location: [facility] ENDOSCOPY;  Service: Gastroenterology;;   HOT HEMOSTASIS N/A 04/27/2014   Procedure: HOT HEMOSTASIS (ARGON PLASMA COAGULATION/BICAP);  Surgeon: Charleston Donnald GAILS, MD;  Location: [facility] ENDOSCOPY;  Service: Endoscopy;  Laterality: N/A;   HOT HEMOSTASIS N/A 09/30/2017   Procedure: HOT HEMOSTASIS (ARGON PLASMA COAGULATION/BICAP);  Surgeon: Saintclair Jasper, MD;  Location: [facility] ENDOSCOPY;  Service: Gastroenterology;  Laterality: N/A;   HOT HEMOSTASIS N/A 10/01/2017   Procedure: HOT HEMOSTASIS (ARGON PLASMA COAGULATION/BICAP);  Surgeon: Saintclair Jasper, MD;  Location: [facility] ENDOSCOPY;  Service: Gastroenterology;  Laterality: N/A;   HOT HEMOSTASIS N/A 10/17/2017   Procedure: HOT HEMOSTASIS (ARGON PLASMA COAGULATION/BICAP);  Surgeon: Elicia Claw, MD;  Location: [facility] ENDOSCOPY;  Service: Gastroenterology;  Laterality: N/A;   HOT HEMOSTASIS N/A 10/19/2017   Procedure: HOT HEMOSTASIS (ARGON PLASMA COAGULATION/BICAP);  Surgeon: Elicia Claw, MD;  Location: [facility] ENDOSCOPY;  Service: Gastroenterology;  Laterality: N/A;   HOT HEMOSTASIS N/A 03/02/2020   Procedure: HOT HEMOSTASIS (ARGON PLASMA COAGULATION/BICAP);  Surgeon: Donnald Charleston, MD;  Location: [facility] ENDOSCOPY;  Service:  Endoscopy;  Laterality: N/A;   HOT HEMOSTASIS N/A 04/12/2022   Procedure: HOT HEMOSTASIS (ARGON  PLASMA COAGULATION/BICAP);  Surgeon: Rosalie Kitchens, MD;  Location: [facility] ENDOSCOPY;  Service: Gastroenterology;  Laterality: N/A;   HOT HEMOSTASIS N/A 04/23/2024   Procedure: EGD, WITH ARGON PLASMA COAGULATION;  Surgeon: Stacia Glendia BRAVO, MD;  Location: [facility] ENDOSCOPY;  Service: Gastroenterology;  Laterality: N/A;   INCISION AND DRAINAGE OF DEEP ABSCESS, ANKLE Right 03/16/2024   Procedure: INCISION AND DRAINAGE OF DEEP ABSCESS, ANKLE;  Surgeon: Elsa Lonni SAUNDERS, MD;  Location: [facility] OR;  Service: Orthopedics;  Laterality: Right;   INCISION AND DRAINAGE OF WOUND Right 07/21/2023   Procedure: IRRIGATION AND DEBRIDEMENT WOUND;  Surgeon: Sherida Adine BROCKS, MD;  Location: [facility] OR;  Service: Orthopedics;  Laterality: Right;  RIGHT ANKLE WOUND I&D   INCISION AND DRAINAGE OF WOUND Right 07/28/2023   Procedure: IRRIGATION AND DEBRIDEMENT WOUND;  Surgeon: Lowery Estefana RAMAN, DO;  Location: [facility] OR;  Service: Plastics;  Laterality: Right;  rrigation and debridement of right ankle wound with placement of myriad and wound vac   IR IMAGING GUIDED PORT INSERTION  07/08/2018   IRRIGATION AND DEBRIDEMENT POSTERIOR HIP Right 07/15/2023   Procedure: IRRIGATION AND DEBRIDEMENT, OPEN FRACTURE;  Surgeon: Sherida Adine BROCKS, MD;  Location: [facility] OR;  Service: Orthopedics;  Laterality: Right;   L shoulder Surgery  04/15/2009   ORIF ANKLE FRACTURE Right 04/24/2023   Procedure: OPEN REDUCTION INTERNAL FIXATION (ORIF) ANKLE FRACTURE;  Surgeon: Sherida Adine BROCKS, MD;  Location: [facility] OR;  Service: Orthopedics;  Laterality: Right;   POLYPECTOMY  03/02/2020   Procedure: POLYPECTOMY;  Surgeon: Donnald Charleston, MD;  Location: [facility] ENDOSCOPY;  Service: Endoscopy;;   SCLEROTHERAPY  11/03/2020   Procedure: MATIAS;  Surgeon: Dianna Specking, MD;  Location: [facility] ENDOSCOPY;  Service: Endoscopy;;   SUBMUCOSAL INJECTION  09/22/2017   Procedure:  SUBMUCOSAL INJECTION;  Surgeon: Rosalie Kitchens, MD;  Location: [facility] ENDOSCOPY;  Service: Endoscopy;;   SUBMUCOSAL INJECTION  12/04/2018   Procedure: SUBMUCOSAL INJECTION;  Surgeon: Dianna Specking, MD;  Location: [facility] ENDOSCOPY;  Service: Endoscopy;;    Current Outpatient Medications  Medication Sig Dispense Refill   acetaminophen  (TYLENOL ) 325 MG tablet Take 2 tablets (650 mg total) by mouth every 6 (six) hours as needed. (Patient taking differently: Take 650 mg by mouth every 6 (six) hours as needed for mild pain (pain score 1-3).) 100 tablet 0   ALPRAZolam  (XANAX ) 0.5 MG tablet Take 1 tablet (0.5 mg total) by mouth at bedtime as needed for anxiety. 20 tablet 0   aminocaproic  acid (AMICAR ) 500 MG tablet Take 2 tablets (1,000 mg total) by mouth every 8 (eight) hours.     amLODipine  (NORVASC ) 10 MG tablet Take 1 tablet (10 mg total) by mouth daily. 30 tablet 11   citalopram  (CELEXA ) 20 MG tablet Take 1 tablet (20 mg total) by mouth daily. 90 tablet 2   gabapentin  (NEURONTIN ) 300 MG capsule Take 1 capsule (300 mg total) by mouth at bedtime. 30 capsule 0   leptospermum manuka honey (MEDIHONEY) PSTE paste Apply 1 Application topically daily. 15 mL 0   lidocaine  4 % Place 1 patch onto the skin daily. Remove & Discard patch within 12 hours or as directed by MD (Patient not taking: No sig reported) 30 patch 0   lisinopril  (ZESTRIL ) 20 MG tablet Take 1 tablet (20 mg total) by mouth daily. 30 tablet 3   methocarbamol  (ROBAXIN ) 500 MG tablet Take 1 tablet (500 mg total) by mouth 3 (three) times daily.     nicotine  (NICODERM CQ  - DOSED IN MG/24 HOURS) 14 mg/24hr patch  Place 1 patch (14 mg total) onto the skin daily.     oxyCODONE -acetaminophen  (PERCOCET/ROXICET) 5-325 MG tablet Take 2 tablets by mouth every 4 (four) hours as needed for severe pain (pain score 7-10). 60 tablet 0   pantoprazole  (PROTONIX ) 40 MG tablet Take 1 tablet (40 mg total) by mouth 2 (two) times daily. TAKE 1 TABLET(40 MG) BY MOUTH TWICE  DAILY 120 tablet 0   polyethylene glycol (MIRALAX  / GLYCOLAX ) 17 g packet Take 17 g by mouth daily.     senna (SENOKOT) 8.6 MG TABS tablet Take 1 tablet (8.6 mg total) by mouth 2 (two) times daily.     sucralfate  (CARAFATE ) 1 GM/10ML suspension Take 10 mLs (1 g total) by mouth 4 (four) times daily -  with meals and at bedtime.     traZODone  (DESYREL ) 50 MG tablet Take 1 tablet (50 mg total) by mouth at bedtime. (Patient taking differently: Take 50 mg by mouth at bedtime as needed for sleep.) 30 tablet 0   vancomycin  IVPB Inject 1,750 mg into the vein daily for 24 days. Indication:  Osteomyelitis First Dose: Yes Last Day of Therapy:  06/04/2024 Labs - weekly:  CBC/D, BMP, and vancomycin  trough. Labs - weekly:  BMP and vancomycin  trough Labs - Once weekly: ESR and CRP Method of administration:Elastomeric or per protocol Method of administration Stephanye Finnicum be changed at the discretion of the patient and/or caregiver's ability to self-administer the medication ordered. 24 Units 0   No current facility-administered medications for this visit.    Allergies as of 05/21/2024 - Review Complete 04/23/2024  Allergen Reaction Noted   Feraheme  [ferumoxytol ] Other (See Comments) 10/09/2017   Nsaids Other (See Comments) 04/25/2014   Wasp venom Anaphylaxis 10/06/2017   Tomato Hives 01/01/2012   Iron  (ferrous sulfate ) [ferrous sulfate  er] Other (See Comments) 03/07/2020    Family History  Problem Relation Age of Onset   Cancer Mother        Ovarian   Ovarian cancer Mother    Diabetes Mother    Kidney disease Mother    Bleeding Disorder Mother    Diabetes type II Sister    Bleeding Disorder Sister    Diabetes Sister    Colon cancer Maternal Grandfather    Crohn's disease Maternal Grandfather    Stomach cancer Maternal Grandmother    Kidney disease Son    Bleeding Disorder Son    Dysmenorrhea Neg Hx     Review of Systems:    Constitutional: No weight loss, fever, chills, weakness or  fatigue HEENT: Eyes: No change in vision               Ears, Nose, Throat:  No change in hearing or congestion Skin: No rash or itching Cardiovascular: No chest pain, chest pressure or palpitations   Respiratory: No SOB or cough Gastrointestinal: See HPI and otherwise negative Genitourinary: No dysuria or change in urinary frequency Neurological: No headache, dizziness or syncope Musculoskeletal: No new muscle or joint pain Hematologic: No bleeding or bruising Psychiatric: No history of depression or anxiety    Physical Exam:  Vital signs: There were no vitals taken for this visit.  Constitutional:   Pleasant *** female/female appears to be in NAD, Well developed, Well nourished, alert and cooperative Eyes:   PEERL, EOMI. No icterus. Conjunctiva pink. Neck:  Supple Throat: Oral cavity and pharynx without inflammation, swelling or lesion.  Respiratory: Respirations even and unlabored. Lungs clear to auscultation bilaterally.   No wheezes, crackles, or rhonchi.  Cardiovascular: Normal S1, S2. Regular rate and rhythm. No peripheral edema, cyanosis or pallor.  Gastrointestinal:  Soft, nondistended, nontender. No rebound or guarding. Normal bowel sounds. No appreciable masses or hepatomegaly. Rectal:  Not performed.  Anoscopy: Msk:  Symmetrical without gross deformities. Without edema, no deformity or joint abnormality.  Neurologic:  Alert and  oriented x4;  grossly normal neurologically.  Skin:   Dry and intact without significant lesions or rashes.  RELEVANT LABS AND IMAGING: CBC    Latest Ref Rng & Units 05/12/2024    5:00 AM 05/11/2024    2:30 AM 05/10/2024    5:58 AM  CBC  WBC 4.0 - 10.5 K/uL 7.5  8.5  8.5   Hemoglobin 12.0 - 15.0 g/dL 7.5  7.6  7.5   Hematocrit 36.0 - 46.0 % 25.5  25.3  25.7   Platelets 150 - 400 K/uL 297  328  317      CMP     Latest Ref Rng & Units 05/12/2024    5:00 AM 05/11/2024    2:30 AM 05/10/2024    5:58 AM  CMP  Glucose 70 - 99 mg/dL 895  869   891   BUN 8 - 23 mg/dL 10  10  11    Creatinine 0.44 - 1.00 mg/dL 9.17  9.25  9.22   Sodium 135 - 145 mmol/L 141  141  143   Potassium 3.5 - 5.1 mmol/L 3.8  3.9  4.0   Chloride 98 - 111 mmol/L 110  109  111   CO2 22 - 32 mmol/L 24  23  24    Calcium  8.9 - 10.3 mg/dL 8.5  8.5  8.4   Total Protein 6.5 - 8.1 g/dL 6.1   6.4   Total Bilirubin 0.0 - 1.2 mg/dL 0.2   <9.7   Alkaline Phos 38 - 126 U/L 84   84   AST 15 - 41 U/L 14   20   ALT 0 - 44 U/L <5   <5      Lab Results  Component Value Date   TSH 3.326 05/09/2023  04/2024 echo-  Left ventricular ejection fraction, by estimation, is 65 to 70%.   Assessment: 1. 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 twice daily oral PPI for 8 weeks, followed by once daily indefinitely (1/10) 2. Start Carafate  suspension 1 g 4 times daily while inpatient. Would discharge on Carafate  tablets 1 g 4 times daily for 2 weeks  3. Patient's iron  deficiency anemia Weston Fulco be best managed medically with Avastin  and iron  and transfusions as needed.  2. Incidental submucosal nodule on VCE -  Will plan to get an outpatient CT enterography to further evaluate.  Plan:  Thank you for the courtesy of this consult. Please call me with any questions or concerns.   Coy Rochford, FNP-C [facility] 05/21/2024, 7:17 AM  Cc: Nooruddin, Saad, MD  "

## 2024-05-21 NOTE — Assessment & Plan Note (Signed)
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 -she has required multiple blood transfusions and APCs since 2019. 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 -She had multiple prolonged hospital stay due to severe GI bleeding.   -s/p IR embolization of gastric artery branch vessels on 12/10/17, cauterization of stomach for severe GI bleed in 11/2018, and epinephrine injection for bleeding ulcer on 11/03/20. -She has required frequent blood transfusion, IV iron and bevacizumab -She does respond to treatment, anemia improved with IV iron and bevacizumab, but she is not compliant with her appointments.

## 2024-05-21 NOTE — Assessment & Plan Note (Signed)
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

## 2024-06-03 ENCOUNTER — Ambulatory Visit: Admitting: Internal Medicine

## 2024-06-10 ENCOUNTER — Ambulatory Visit: Admitting: Gastroenterology
